# Patient Record
Sex: Female | Born: 1960
Health system: Southern US, Community
[De-identification: ages and names within clinical notes are randomized; demographics above are authoritative.]

## PROBLEM LIST (undated history)

## (undated) DIAGNOSIS — Z992 Dependence on renal dialysis: Secondary | ICD-10-CM

## (undated) DIAGNOSIS — I2510 Atherosclerotic heart disease of native coronary artery without angina pectoris: Secondary | ICD-10-CM

## (undated) DIAGNOSIS — K219 Gastro-esophageal reflux disease without esophagitis: Secondary | ICD-10-CM

## (undated) DIAGNOSIS — K8020 Calculus of gallbladder without cholecystitis without obstruction: Secondary | ICD-10-CM

## (undated) DIAGNOSIS — I517 Cardiomegaly: Secondary | ICD-10-CM

## (undated) DIAGNOSIS — M5135 Other intervertebral disc degeneration, thoracolumbar region: Secondary | ICD-10-CM

## (undated) DIAGNOSIS — I272 Pulmonary hypertension, unspecified: Secondary | ICD-10-CM

## (undated) DIAGNOSIS — I509 Heart failure, unspecified: Secondary | ICD-10-CM

## (undated) DIAGNOSIS — N186 End stage renal disease: Secondary | ICD-10-CM

## (undated) DIAGNOSIS — I6789 Other cerebrovascular disease: Secondary | ICD-10-CM

## (undated) DIAGNOSIS — E669 Obesity, unspecified: Secondary | ICD-10-CM

## (undated) DIAGNOSIS — B019 Varicella without complication: Secondary | ICD-10-CM

## (undated) DIAGNOSIS — K759 Inflammatory liver disease, unspecified: Secondary | ICD-10-CM

## (undated) DIAGNOSIS — I255 Ischemic cardiomyopathy: Secondary | ICD-10-CM

## (undated) DIAGNOSIS — M109 Gout, unspecified: Secondary | ICD-10-CM

## (undated) DIAGNOSIS — F1911 Other psychoactive substance abuse, in remission: Secondary | ICD-10-CM

## (undated) DIAGNOSIS — I5020 Unspecified systolic (congestive) heart failure: Secondary | ICD-10-CM

## (undated) DIAGNOSIS — E785 Hyperlipidemia, unspecified: Secondary | ICD-10-CM

## (undated) DIAGNOSIS — I4891 Unspecified atrial fibrillation: Secondary | ICD-10-CM

## (undated) DIAGNOSIS — I429 Cardiomyopathy, unspecified: Secondary | ICD-10-CM

## (undated) DIAGNOSIS — M1990 Unspecified osteoarthritis, unspecified site: Secondary | ICD-10-CM

## (undated) DIAGNOSIS — D631 Anemia in chronic kidney disease: Secondary | ICD-10-CM

## (undated) DIAGNOSIS — I4510 Unspecified right bundle-branch block: Secondary | ICD-10-CM

## (undated) DIAGNOSIS — M5030 Other cervical disc degeneration, unspecified cervical region: Secondary | ICD-10-CM

## (undated) DIAGNOSIS — I700 Atherosclerosis of aorta: Secondary | ICD-10-CM

## (undated) DIAGNOSIS — D649 Anemia, unspecified: Secondary | ICD-10-CM

## (undated) DIAGNOSIS — K5790 Diverticulosis of intestine, part unspecified, without perforation or abscess without bleeding: Secondary | ICD-10-CM

## (undated) DIAGNOSIS — R0600 Dyspnea, unspecified: Secondary | ICD-10-CM

## (undated) DIAGNOSIS — B1920 Unspecified viral hepatitis C without hepatic coma: Secondary | ICD-10-CM

## (undated) DIAGNOSIS — J189 Pneumonia, unspecified organism: Secondary | ICD-10-CM

## (undated) DIAGNOSIS — J449 Chronic obstructive pulmonary disease, unspecified: Secondary | ICD-10-CM

## (undated) DIAGNOSIS — Z7901 Long term (current) use of anticoagulants: Secondary | ICD-10-CM

## (undated) DIAGNOSIS — I6502 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: Secondary | ICD-10-CM

## (undated) DIAGNOSIS — N250 Renal osteodystrophy: Secondary | ICD-10-CM

## (undated) DIAGNOSIS — R519 Headache, unspecified: Secondary | ICD-10-CM

## (undated) DIAGNOSIS — I739 Peripheral vascular disease, unspecified: Secondary | ICD-10-CM

## (undated) DIAGNOSIS — F69 Unspecified disorder of adult personality and behavior: Secondary | ICD-10-CM

## (undated) DIAGNOSIS — E039 Hypothyroidism, unspecified: Secondary | ICD-10-CM

## (undated) DIAGNOSIS — N289 Disorder of kidney and ureter, unspecified: Secondary | ICD-10-CM

## (undated) DIAGNOSIS — I219 Acute myocardial infarction, unspecified: Secondary | ICD-10-CM

## (undated) DIAGNOSIS — F319 Bipolar disorder, unspecified: Secondary | ICD-10-CM

## (undated) DIAGNOSIS — I10 Essential (primary) hypertension: Secondary | ICD-10-CM

## (undated) DIAGNOSIS — I639 Cerebral infarction, unspecified: Secondary | ICD-10-CM

## (undated) DIAGNOSIS — N19 Unspecified kidney failure: Secondary | ICD-10-CM

## (undated) DIAGNOSIS — I480 Paroxysmal atrial fibrillation: Secondary | ICD-10-CM

## (undated) DIAGNOSIS — Z8659 Personal history of other mental and behavioral disorders: Secondary | ICD-10-CM

## (undated) HISTORY — DX: Essential (primary) hypertension: I10

## (undated) HISTORY — DX: Hyperlipidemia, unspecified: E78.5

## (undated) HISTORY — PX: DIALYSIS FISTULA CREATION: SHX611

## (undated) HISTORY — DX: Heart failure, unspecified: I50.9

## (undated) HISTORY — DX: Unspecified kidney failure: N19

## (undated) HISTORY — PX: OTHER SURGICAL HISTORY: SHX169

## (undated) HISTORY — PX: COLONOSCOPY: SHX174

## (undated) HISTORY — DX: Cerebral infarction, unspecified: I63.9

## (undated) HISTORY — DX: Acute myocardial infarction, unspecified: I21.9

## (undated) HISTORY — PX: CORONARY ARTERY BYPASS GRAFT: SHX141

## (undated) HISTORY — PX: ESOPHAGOGASTRODUODENOSCOPY: SHX1529

---

## 2002-01-23 DIAGNOSIS — I214 Non-ST elevation (NSTEMI) myocardial infarction: Secondary | ICD-10-CM

## 2002-01-23 HISTORY — DX: Non-ST elevation (NSTEMI) myocardial infarction: I21.4

## 2002-01-24 HISTORY — PX: LEFT HEART CATH AND CORONARY ANGIOGRAPHY: CATH118249

## 2002-01-25 HISTORY — PX: CORONARY ANGIOPLASTY WITH STENT PLACEMENT: SHX49

## 2004-06-21 ENCOUNTER — Emergency Department: Payer: Self-pay | Admitting: Emergency Medicine

## 2006-09-07 ENCOUNTER — Emergency Department: Payer: Self-pay | Admitting: Emergency Medicine

## 2006-09-07 ENCOUNTER — Other Ambulatory Visit: Payer: Self-pay

## 2008-08-21 IMAGING — CR DG CHEST 2V
1 series · 2 of 2 positions shown · non-contrast
Comparison: none

REASON FOR EXAM: chest pain - er [HOSPITAL]
COMMENTS:

PROCEDURE:     DXR - DXR CHEST PA (OR AP) AND LATERAL  - September 07, 2006  [DATE]
RESULT:     The lung fields are clear.  The heart, mediastinal and osseous
structures show no significant abnormalities.

[Series 1: view not recorded · 0.17mm/px · 2 of 2 slices shown]
[im 1/2]
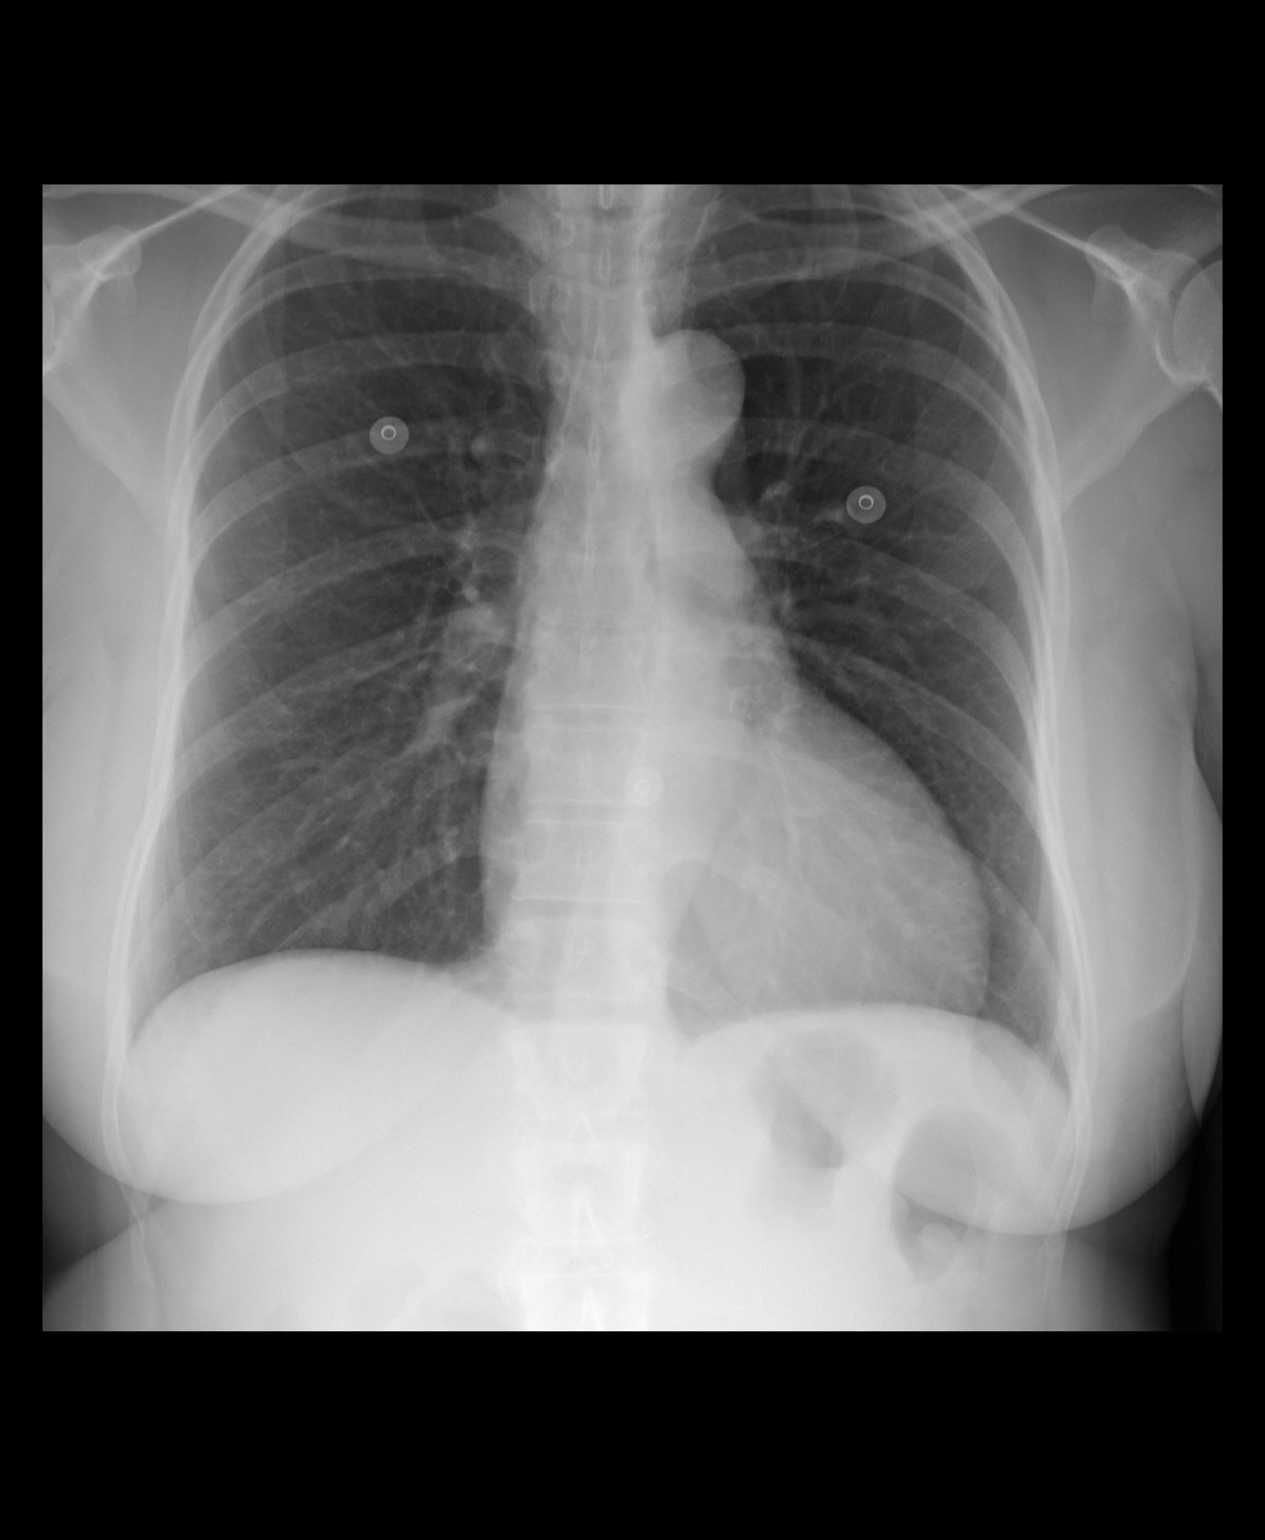
[im 2/2]
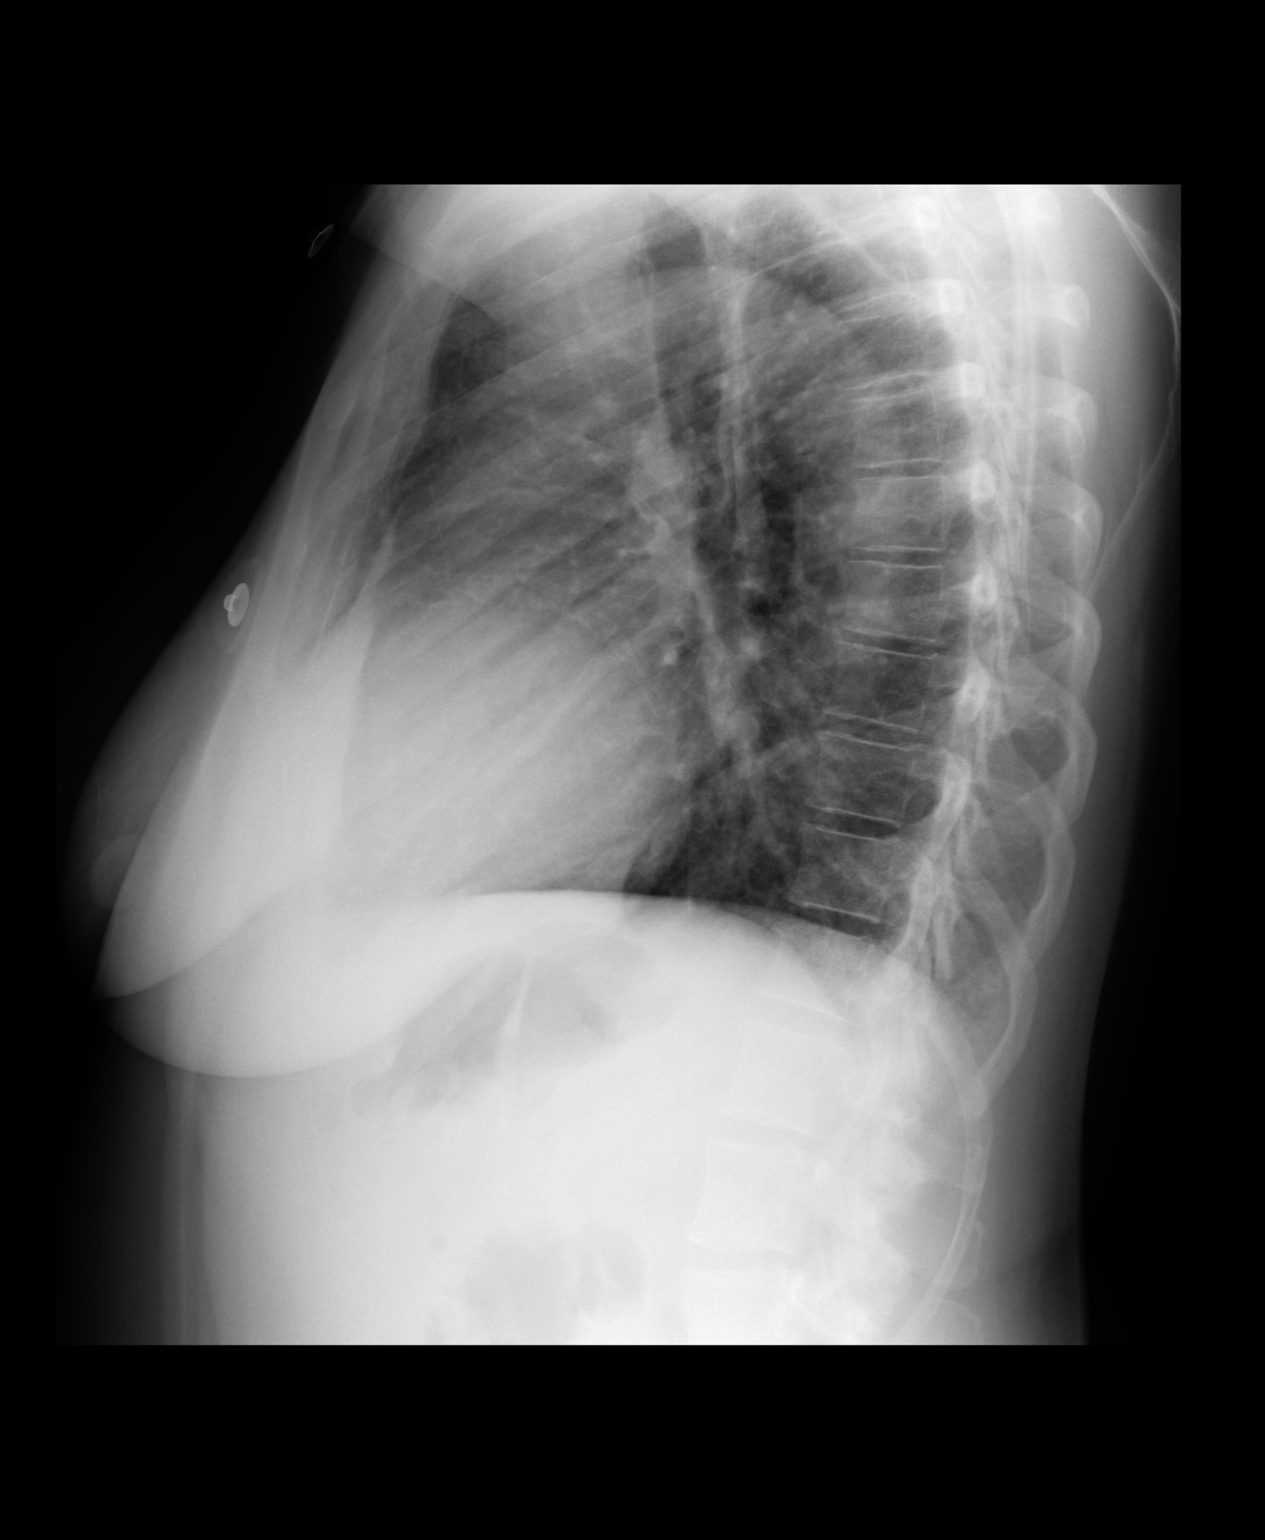

[2 of 2 positions shown; findings below may reference images not displayed]

IMPRESSION: No acute changes are identified.

## 2008-12-18 ENCOUNTER — Inpatient Hospital Stay: Payer: Self-pay | Admitting: Internal Medicine

## 2009-06-15 DIAGNOSIS — I639 Cerebral infarction, unspecified: Secondary | ICD-10-CM

## 2009-06-15 HISTORY — DX: Cerebral infarction, unspecified: I63.9

## 2010-05-21 ENCOUNTER — Inpatient Hospital Stay: Payer: Self-pay | Admitting: Internal Medicine

## 2010-06-04 ENCOUNTER — Encounter: Payer: Self-pay | Admitting: Internal Medicine

## 2010-06-15 ENCOUNTER — Encounter: Payer: Self-pay | Admitting: Internal Medicine

## 2010-06-20 ENCOUNTER — Ambulatory Visit: Payer: Self-pay | Admitting: Vascular Surgery

## 2010-06-25 ENCOUNTER — Ambulatory Visit: Payer: Self-pay | Admitting: Vascular Surgery

## 2010-08-08 ENCOUNTER — Ambulatory Visit: Payer: Self-pay | Admitting: Vascular Surgery

## 2010-08-13 ENCOUNTER — Inpatient Hospital Stay: Payer: Self-pay | Admitting: Internal Medicine

## 2010-09-10 ENCOUNTER — Ambulatory Visit: Payer: Self-pay | Admitting: Vascular Surgery

## 2010-10-08 ENCOUNTER — Ambulatory Visit: Payer: Self-pay | Admitting: Vascular Surgery

## 2010-10-14 ENCOUNTER — Inpatient Hospital Stay: Payer: Self-pay | Admitting: Internal Medicine

## 2010-11-17 ENCOUNTER — Ambulatory Visit: Payer: Self-pay | Admitting: Vascular Surgery

## 2010-12-02 IMAGING — CR DG CHEST 1V PORT
1 series · 1 of 1 positions shown · non-contrast
Comparison: none

REASON FOR EXAM: shortness of breath
COMMENTS:

PROCEDURE:     DXR - DXR PORTABLE CHEST SINGLE VIEW  - December 18, 2008  [DATE]
RESULT:     Cardiomegaly is present. There is mild interstitial prominence.
A mild component of congestive heart failure may be present. Chest is
otherwise unremarkable.

[view not recorded]
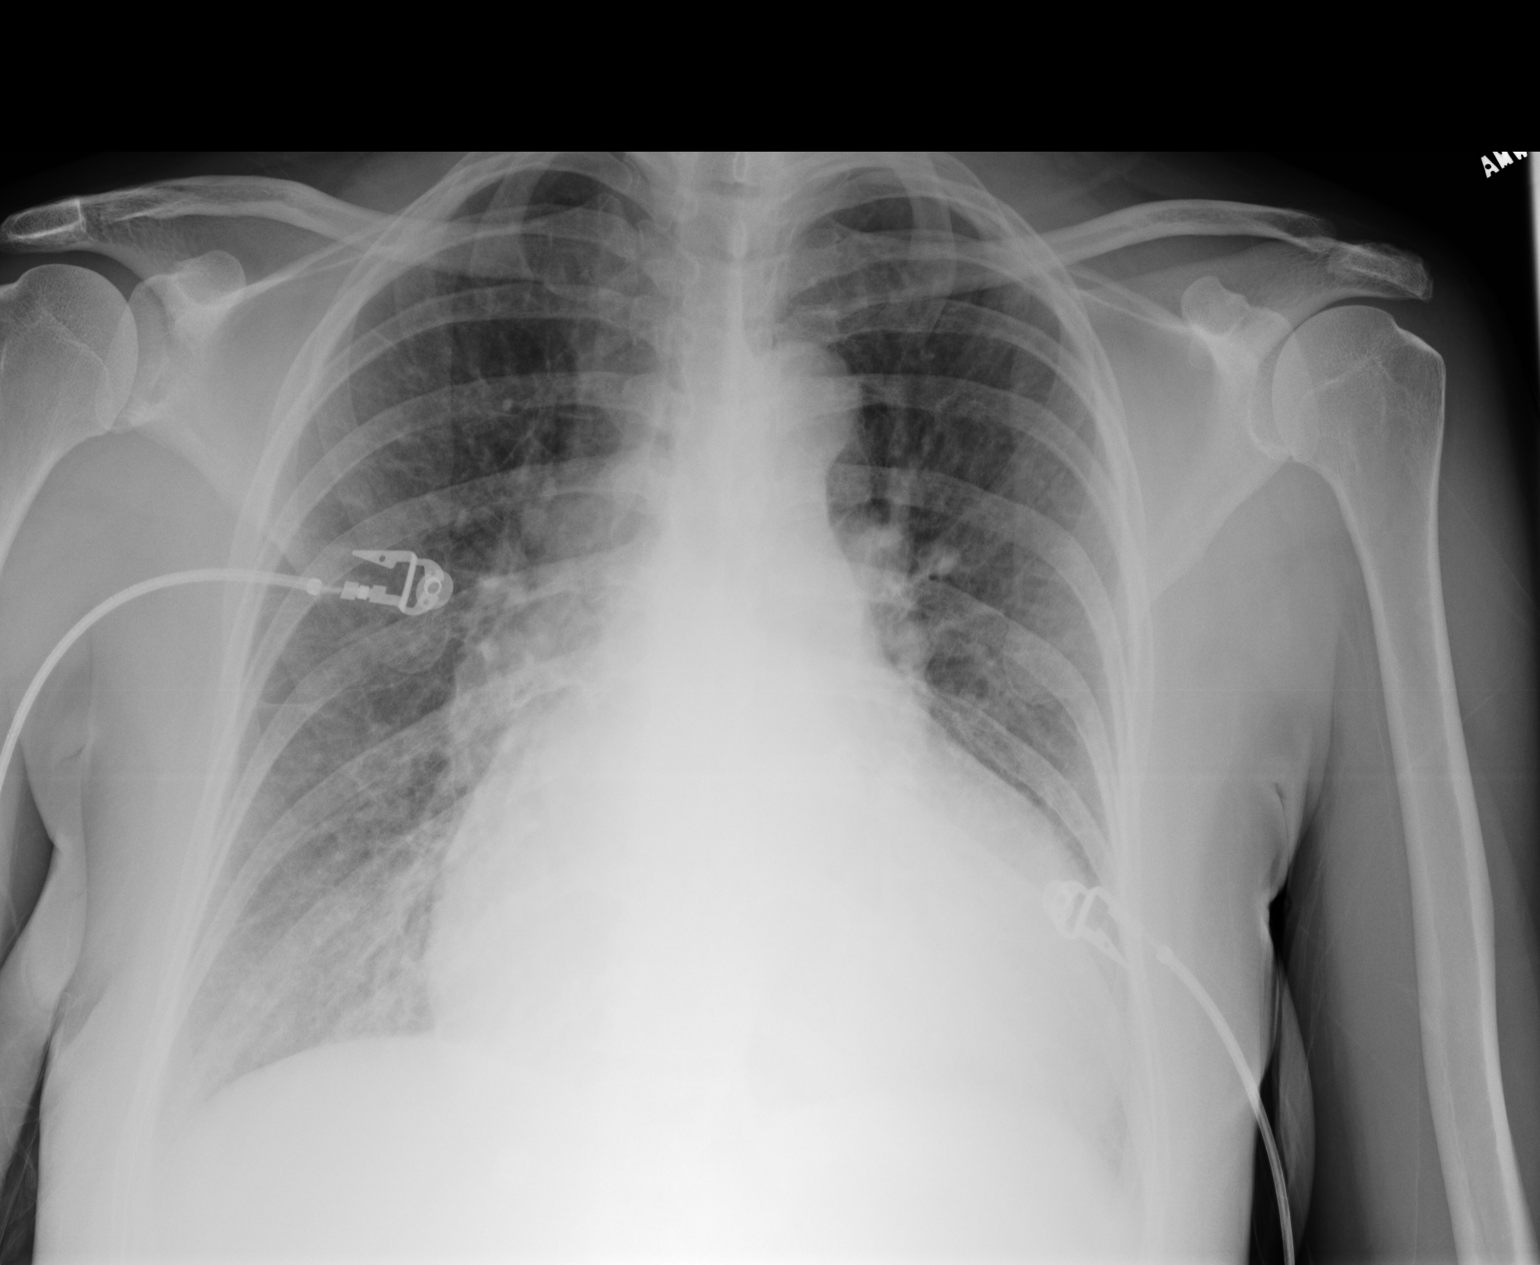

[1 of 1 positions shown; findings below may reference images not displayed]

IMPRESSION: 1. Findings suggesting congestive heart failure with pulmonary interstitial
pneumonia.

## 2010-12-02 IMAGING — US US RENAL KIDNEY
1 series · 17 of 25 positions shown · non-contrast
Comparison: none

REASON FOR EXAM: hypertive crisis /renal failure
COMMENTS:

PROCEDURE:     US  - US KIDNEY  - December 18, 2008  [DATE]
RESULT:     Bilateral echogenic kidneys are noted consistent with chronic
renal disease. Right kidney measures 11.2 cm in maximum length the left 11
cm. There is no hydronephrosis. Bladder is normal.

[Series 1: us renal kidney · 17 of 35 slices shown]
[im 1/35]
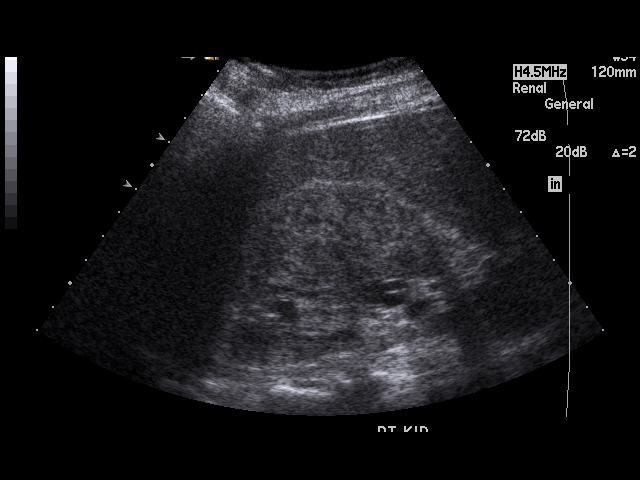
[im 3/35]
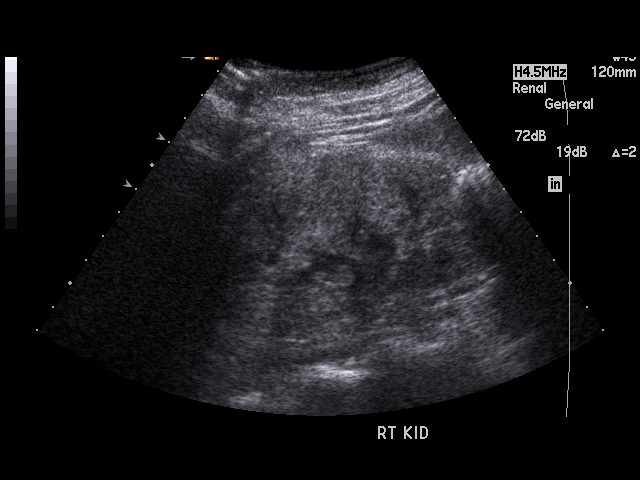
[im 5/35]
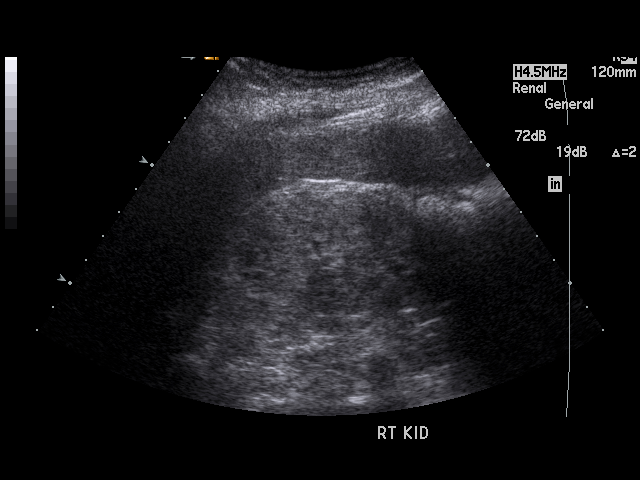
[im 8/35]
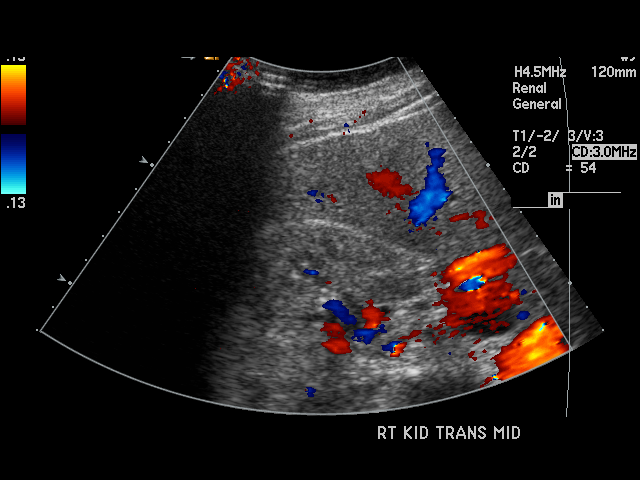
[im 9/35]
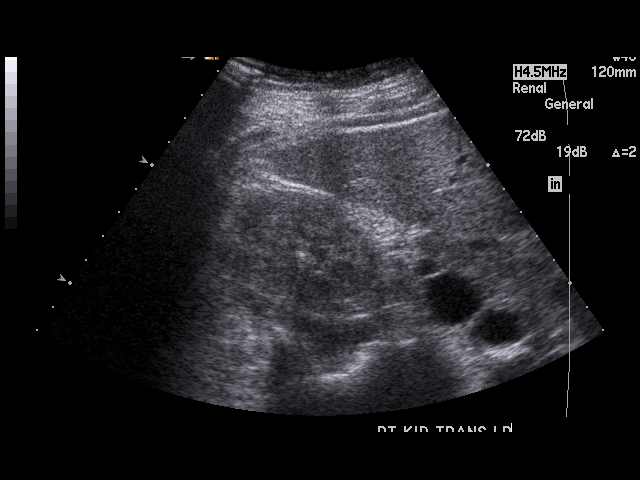
[im 12/35]
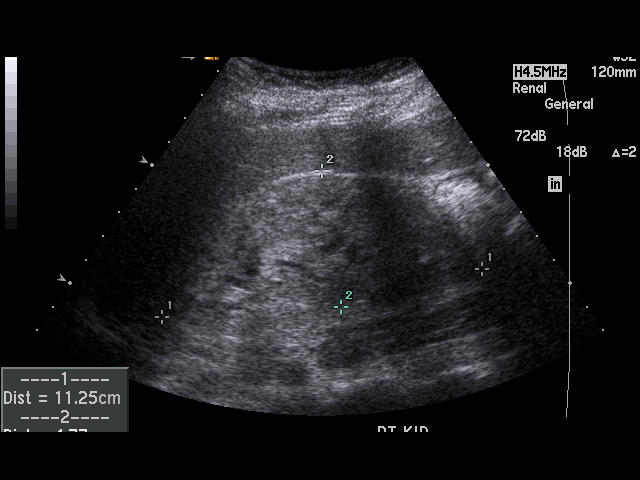
[im 13/35]
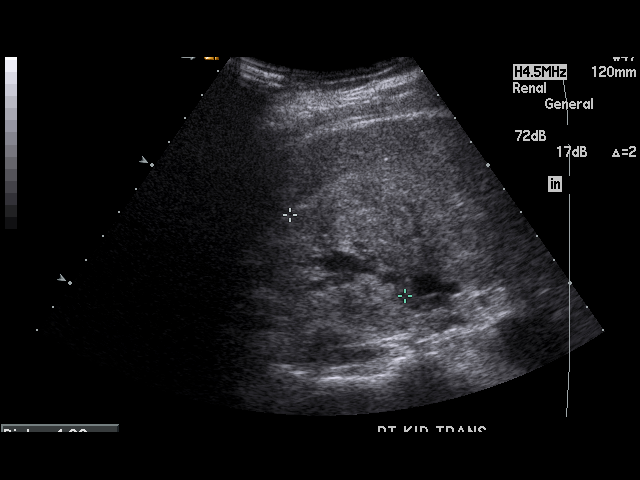
[im 16/35]
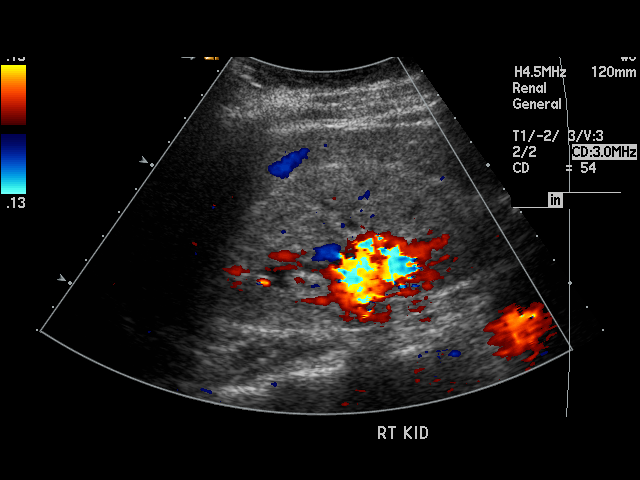
[im 18/35]
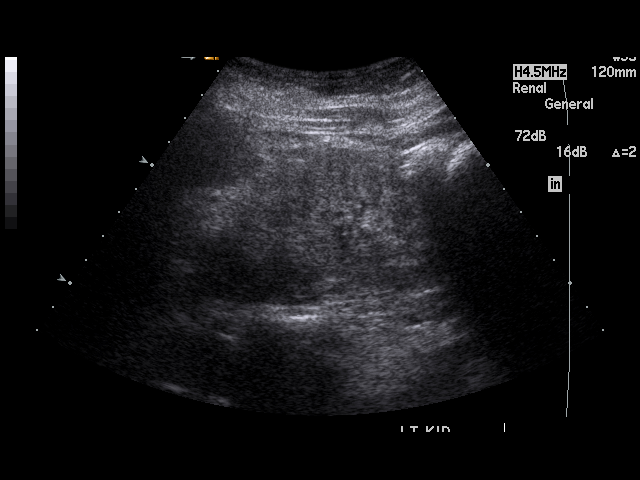
[im 19/35]
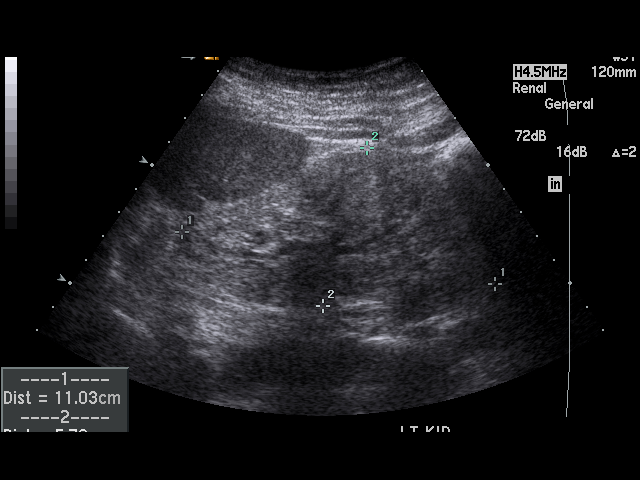
[im 22/35]
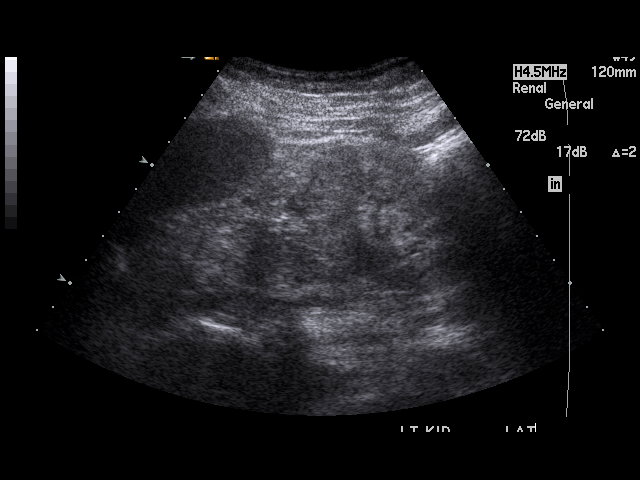
[im 23/35]
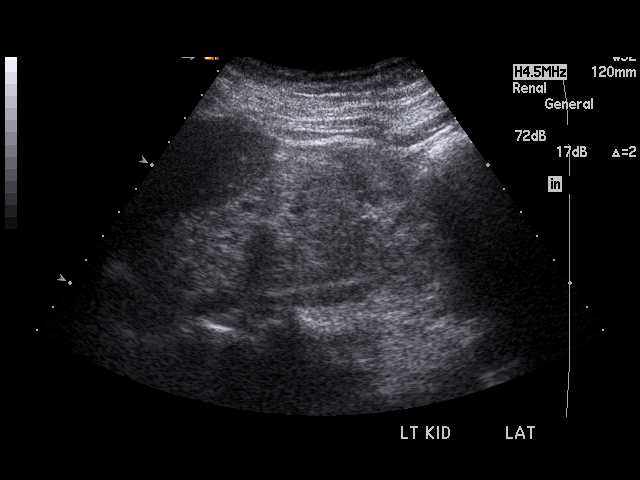
[im 26/35]
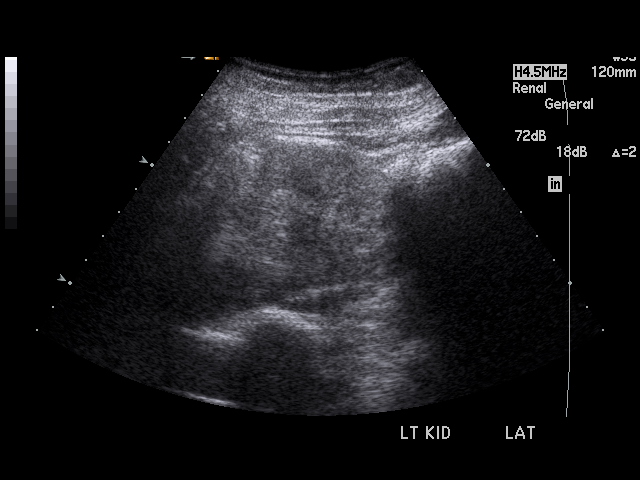
[im 27/35]
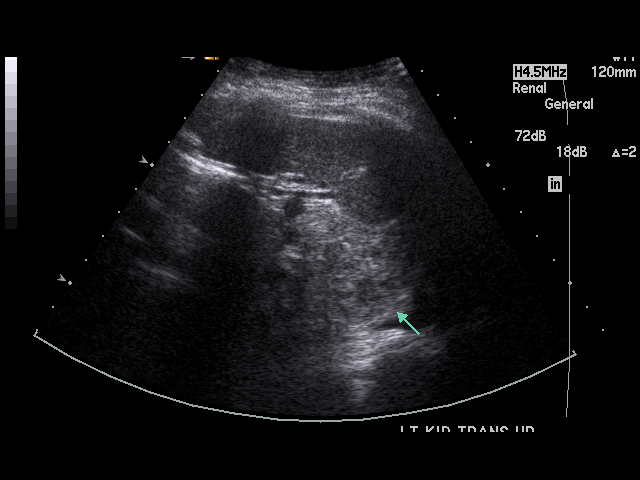
[im 30/35]
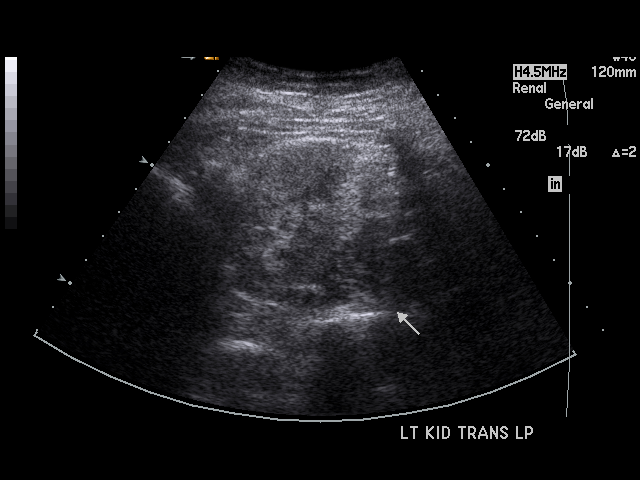
[im 32/35]
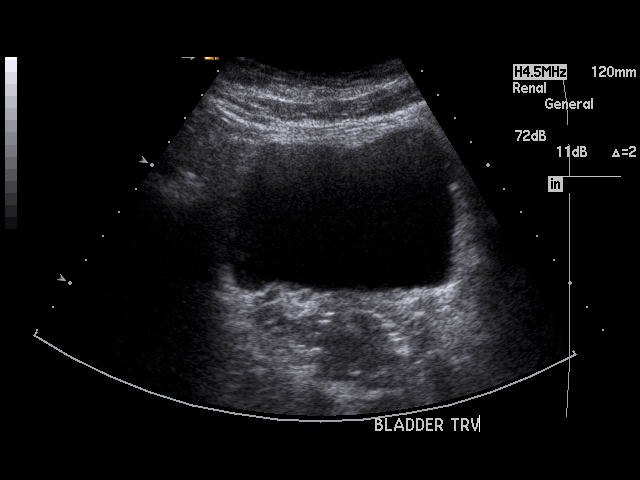
[im 35/35]
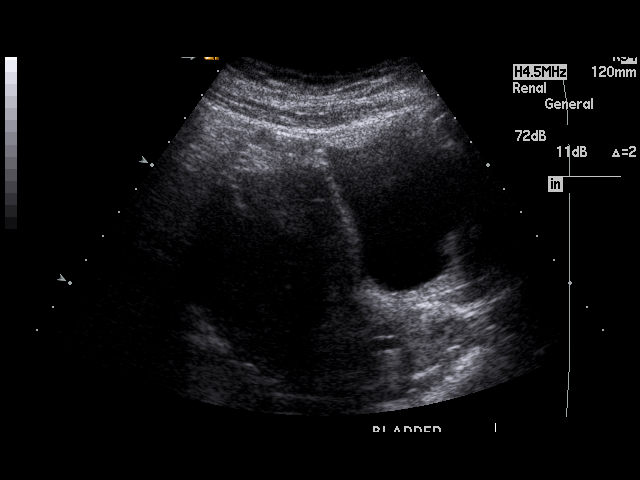

[17 of 25 positions shown; findings below may reference images not displayed]

IMPRESSION: Bilateral echogenic kidneys consistent with chronic renal
disease. No hydronephrosis or bladder distention. Urine is present in the
bladder.

## 2011-06-16 HISTORY — PX: CORONARY ANGIOPLASTY WITH STENT PLACEMENT: SHX49

## 2011-12-16 ENCOUNTER — Ambulatory Visit: Payer: Self-pay | Admitting: Vascular Surgery

## 2011-12-16 LAB — POTASSIUM: Potassium: 3.7 mmol/L (ref 3.5–5.1)

## 2012-01-20 ENCOUNTER — Ambulatory Visit: Payer: Self-pay | Admitting: Vascular Surgery

## 2012-05-04 IMAGING — CR DG CHEST 2V
1 series · 2 of 2 positions shown · non-contrast
Comparison: none

REASON FOR EXAM: confusion
COMMENTS:

[Series 1: view not recorded · 0.17mm/px · 2 of 2 slices shown]
[im 1/2]
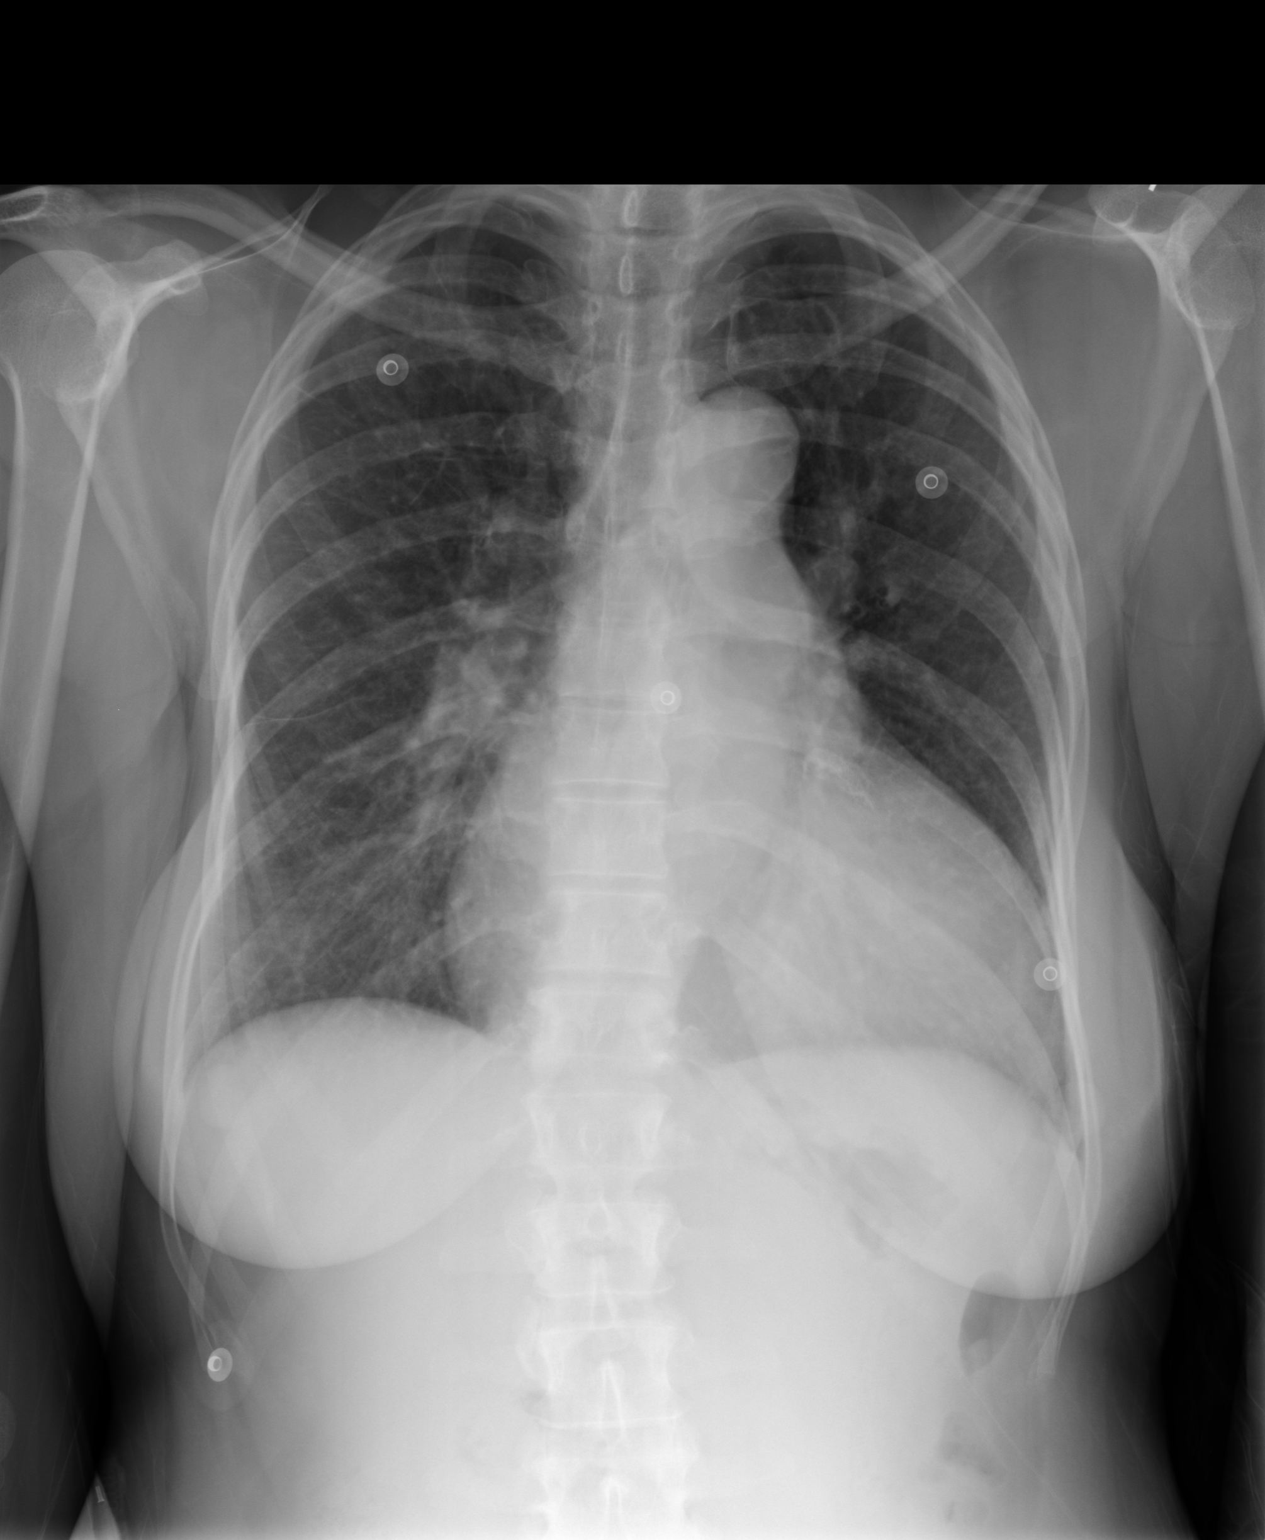
[im 2/2]
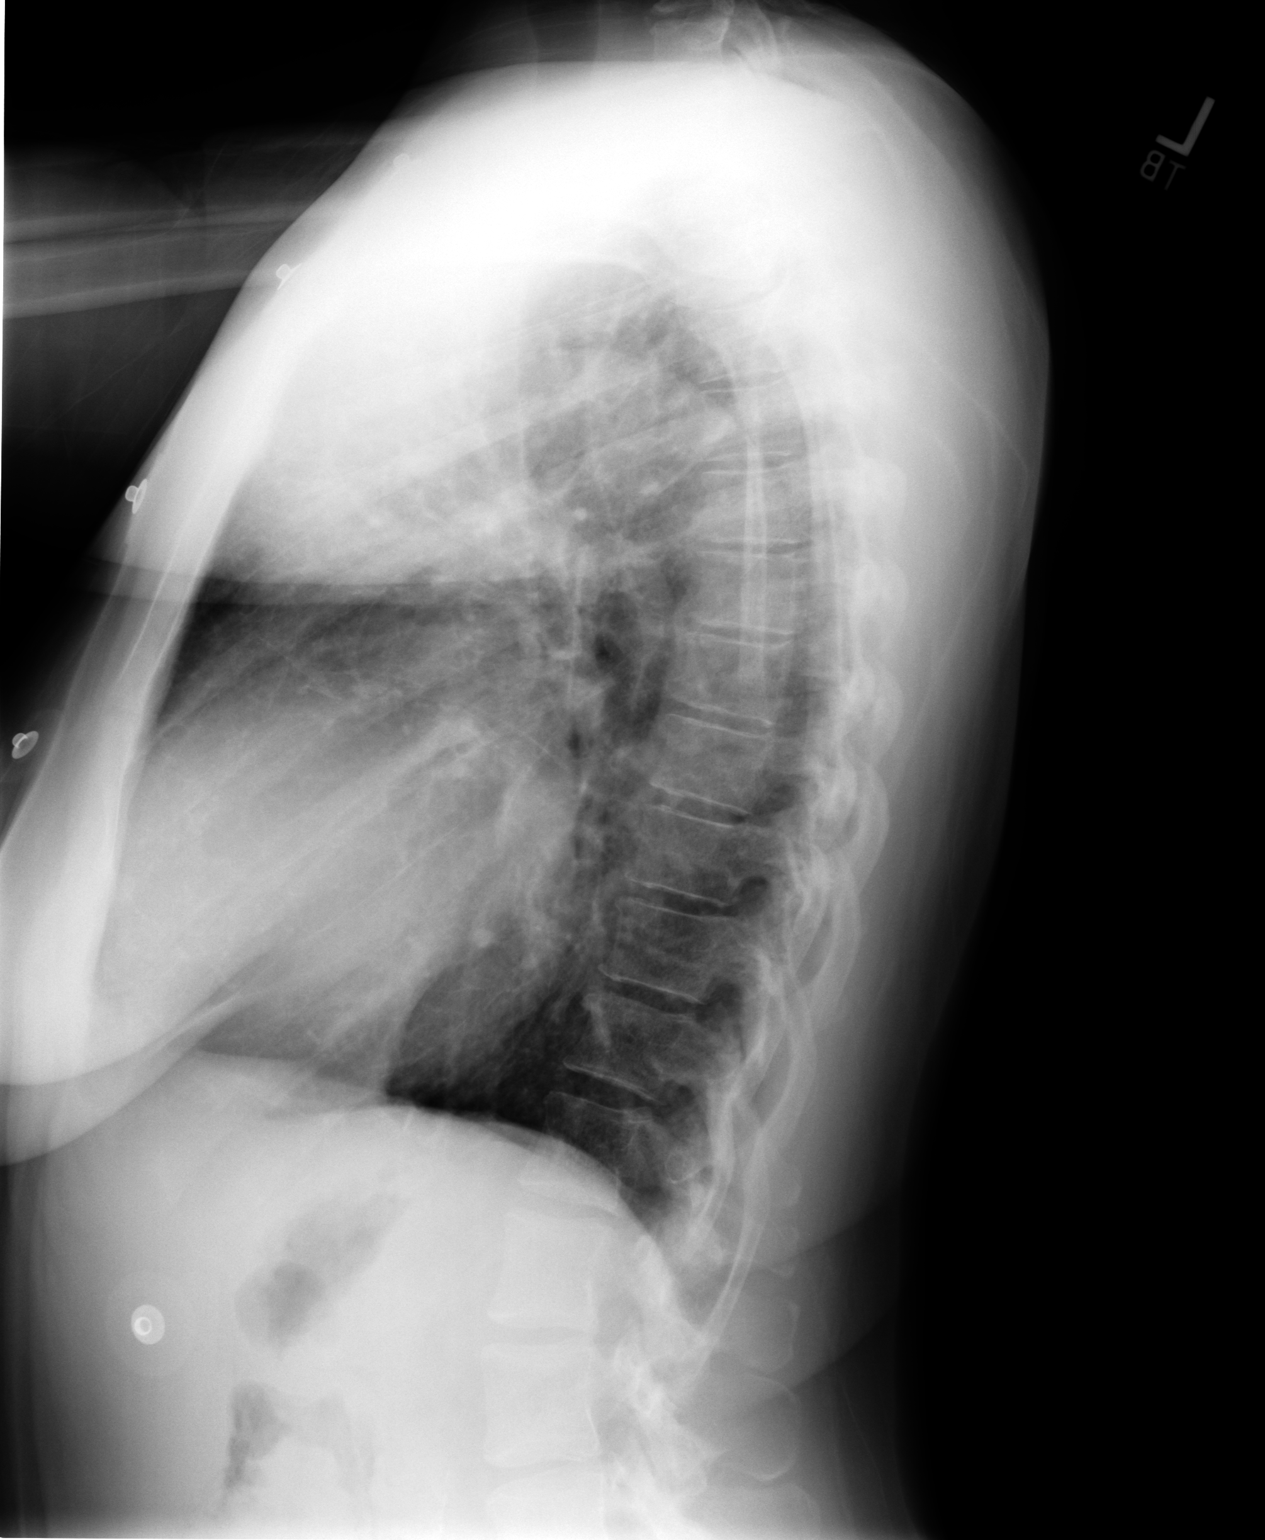

[2 of 2 positions shown; findings below may reference images not displayed]

PROCEDURE:     DXR - DXR CHEST PA (OR AP) AND LATERAL  - May 21, 2010  [DATE]

RESULT:     Comparison is made to the prior exam of 12/18/2008.

There is slight prominence of the pulmonary vascularity suspicious for
minimal pulmonary vascular congestion. The findings are less prominent than
that observed on the exam of 12/18/2008 but are more prominent than on the
baseline exam of 09/07/2006. The heart is moderately enlarged but is stable
in size as compared to the prior exam. No frank pulmonary edema or pleural
effusion is seen. The osseous structures are normal in appearance.
IMPRESSION: 1.  There are observed changes compatible with chronic or recurrent
pulmonary vascular congestion.
2.  No frank pulmonary edema is identified at this time.
3.  No pneumonia is seen.
4.  Cardiomegaly.

## 2012-05-04 IMAGING — CT CT HEAD WITHOUT CONTRAST
2 series · 15 of 30 positions shown, 19 images · non-contrast
Comparison: none

REASON FOR EXAM: confusion, exclude bleed
COMMENTS:

PROCEDURE:     CT  - CT HEAD WITHOUT CONTRAST  - May 21, 2010  [DATE]
RESULT:     Head CT dated 05/21/2010
TECHNIQUE: Helical noncontrasted 5 mm sections were obtained from the skull
base to the vertex.

[Series 2: without · axial · non-contrast · 0.38mm/px · z∈[+1113,+1233]mm · 13 of 30 slices shown, 17 images]
[im 3/30  brain]
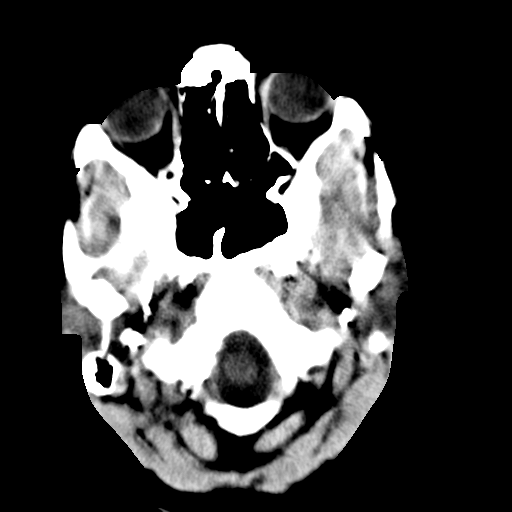
[im 3/30  bone]
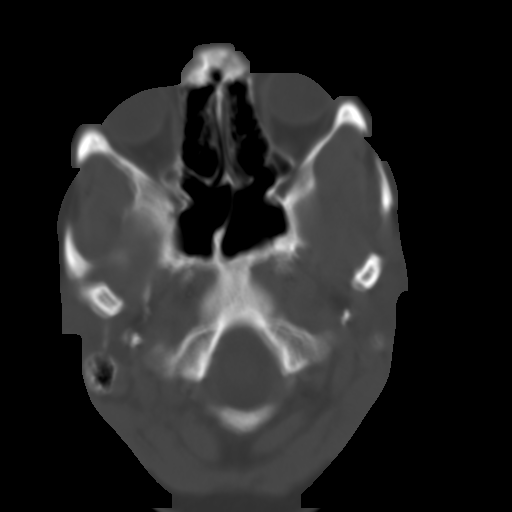
[im 5/30  brain]
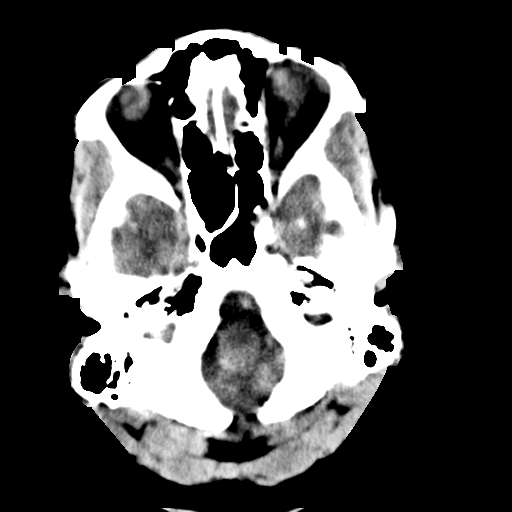
[im 7/30  brain]
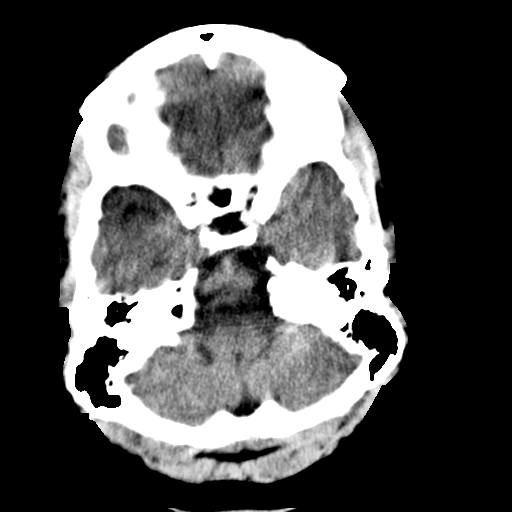
[im 9/30  brain]
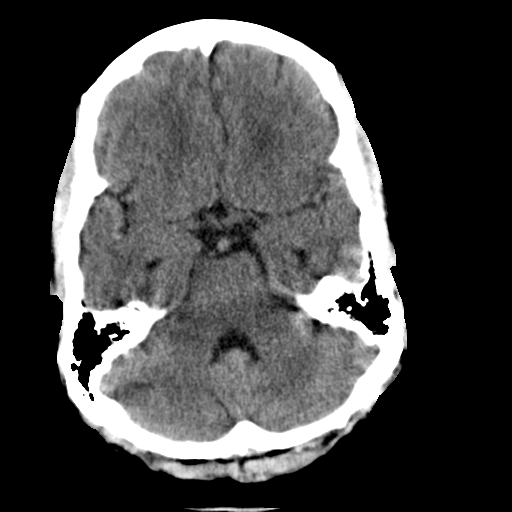
[im 11/30  brain]
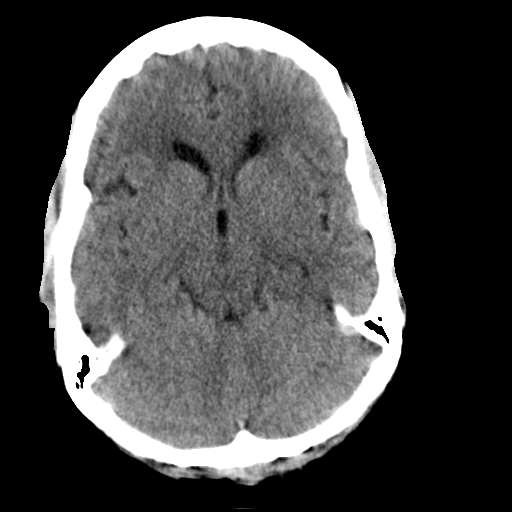
[im 11/30  bone]
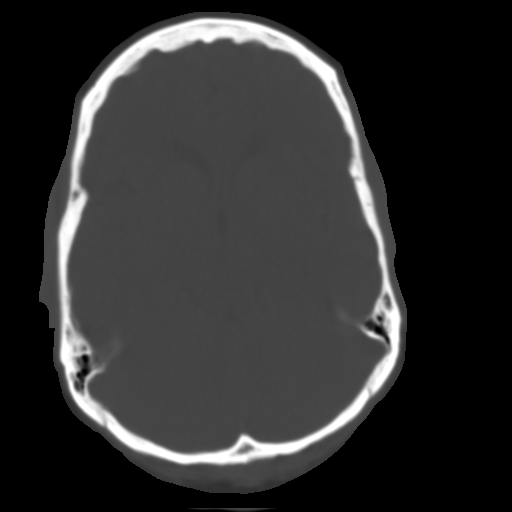
[im 13/30  brain]
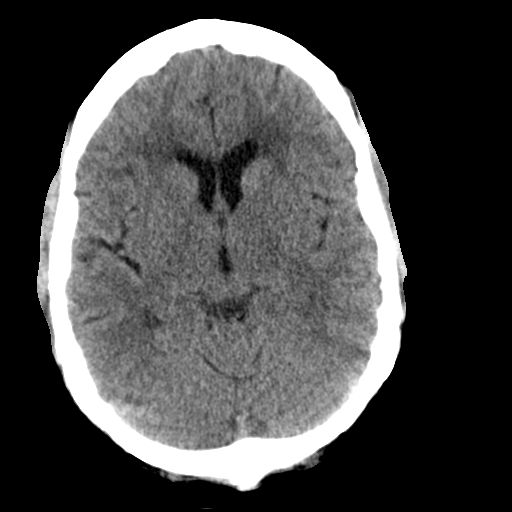
[im 15/30  brain]
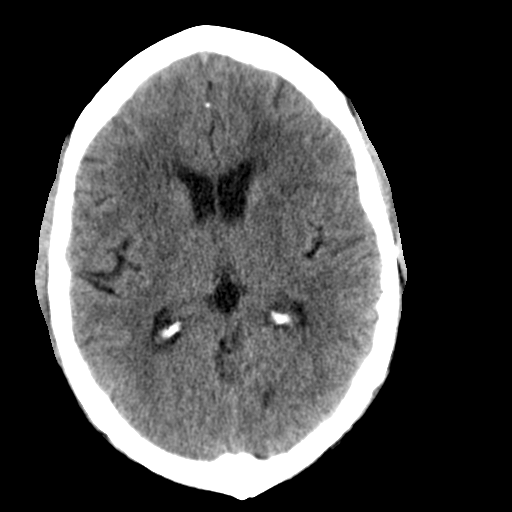
[im 17/30  brain]
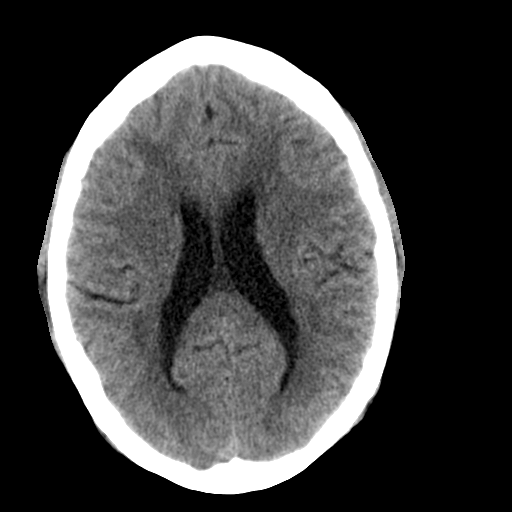
[im 19/30  brain]
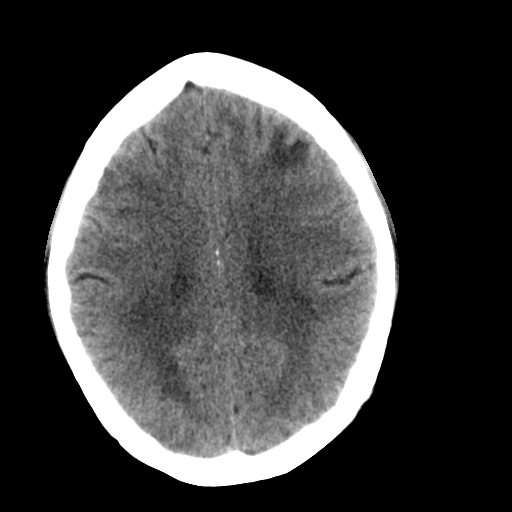
[im 19/30  bone]
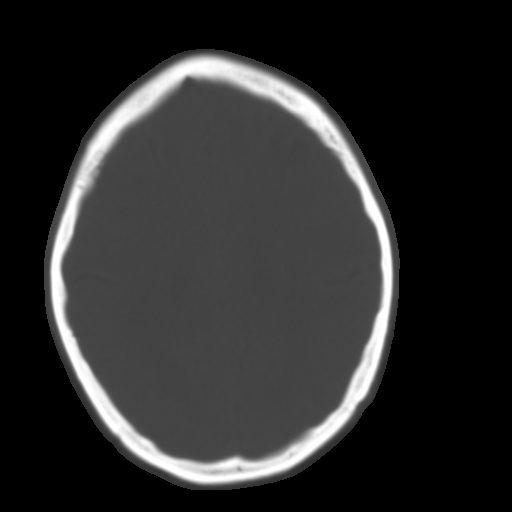
[im 21/30  brain]
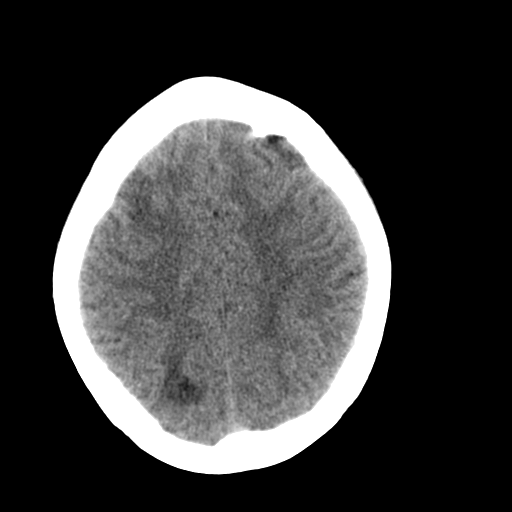
[im 23/30  brain]
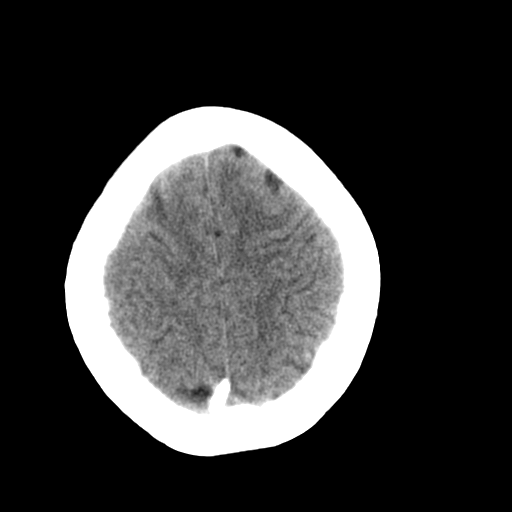
[im 25/30  brain]
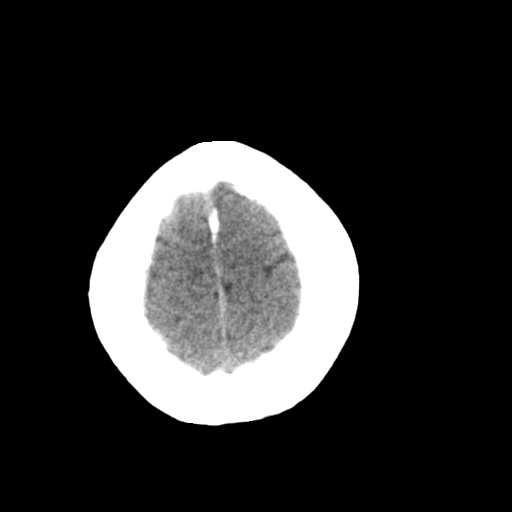
[im 27/30  brain]
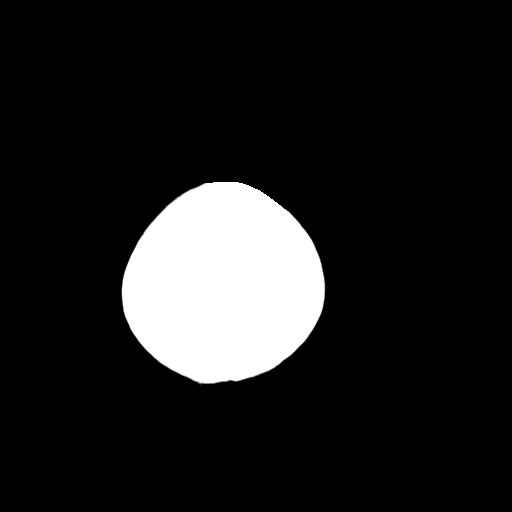
[im 27/30  bone]
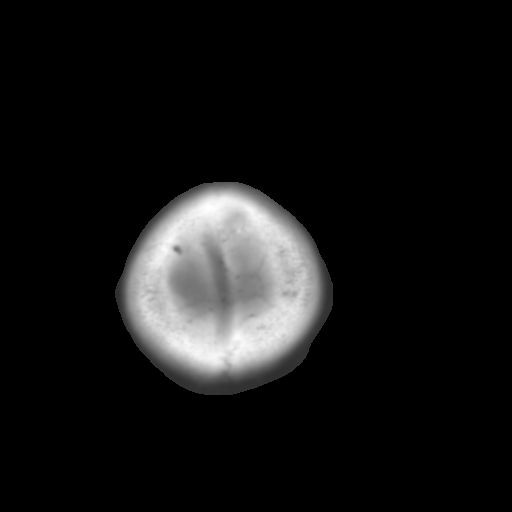

[Series 3: bone · axial · 0.38mm/px · z∈[+1113,+1133]mm · 2 of 31 slices shown]
[im 3/31  bone]
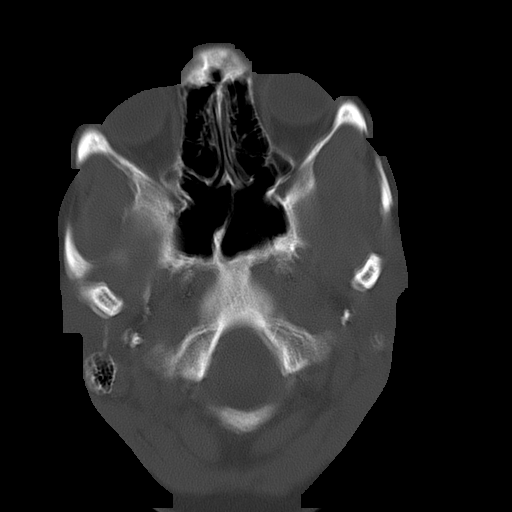
[im 7/31  bone]
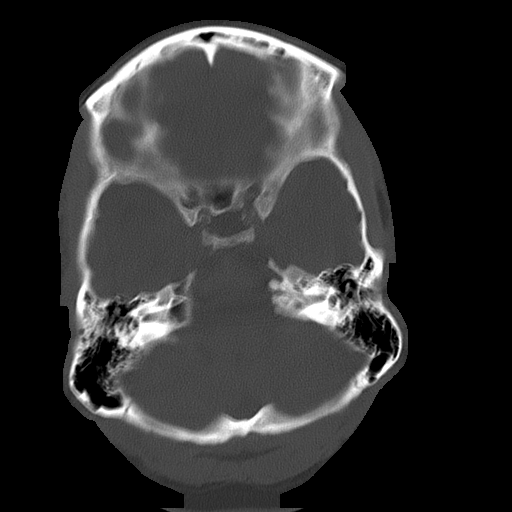

[15 of 30 positions shown; findings below may reference images not displayed]

FINDINGS: Focal punctate areas of low attenuation project within the
anterior periphery of the right left frontal lobes within the right
posterior parietal region. These areas demonstrate possible white matter
distribution pattern . Different considerations are regions of infarction,
neoplastic disease, infection or possibly a demyelinating disorder. Further
evaluation with MRI is recommended. Diffuse areas of low attenuation project
within the subcortical, deep, and periventricular white matter regions. The
ventricles and cisterns are patent. No evidence of acute hemorrhage nor
subfalcine or tonsillar herniation.  No evidence of a depressed skull
fracture.
IMPRESSION: Bilateral focal areas of low attenuation as described
above. Different considerations are regions of infarction, though etiologies
such as neoplastic disease and/or infection as well as white matter
demyelinating disorders cannot be excluded. Further evaluation with MRI is
recommended.
2. These findings were discussed with Dr. Lulusa of the emergency department
at the time of initial interpretation.
3. Small vessel white matter ischemic changes.

## 2012-05-06 IMAGING — MR MRI HEAD WITHOUT CONTRAST
5 of 6 series · 35 of 48 positions shown · non-contrast
Comparison: none

REASON FOR EXAM: dysphonia and positive CT head
COMMENTS:

PROCEDURE:     MR  - MR BRAIN WO CONTRAST  - May 23, 2010  [DATE]
RESULT:     Comparison: CT brain 05/21/2010
TECHNIQUE: Standard brain protocol, without intravenous contrast.

[Series 2: T1 · axial · 10.0mm · 0.57mm/px · z∈[+0,+120]mm · 3 of 24 slices shown]
[im 1/24]
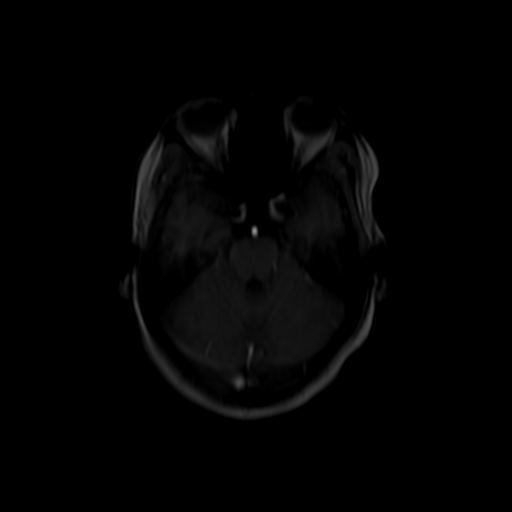
[im 4/24]
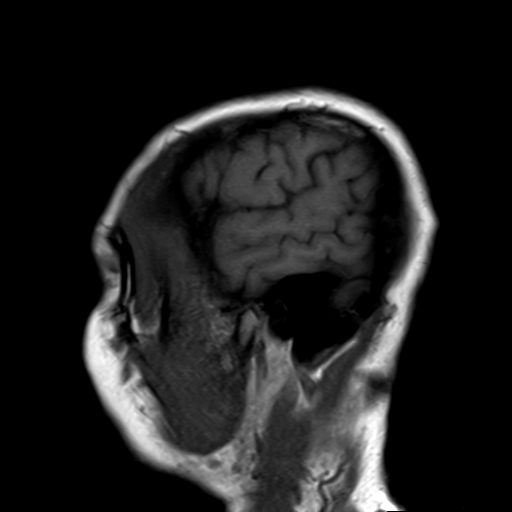
[im 7/24]
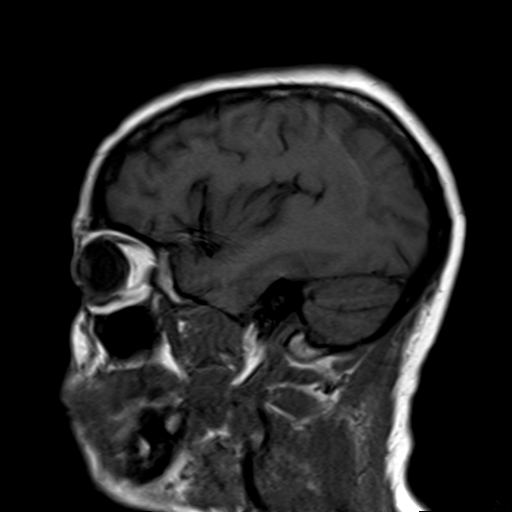

[Series 6: T2 · axial · 5.0mm · 0.60mm/px · z∈[-53,+140]mm · 8 of 24 slices shown]
[im 1/24]
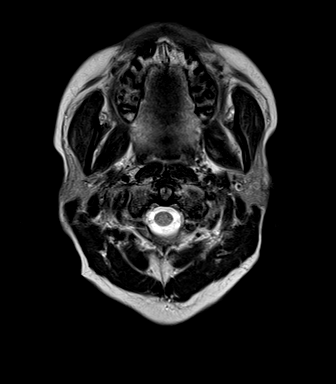
[im 4/24]
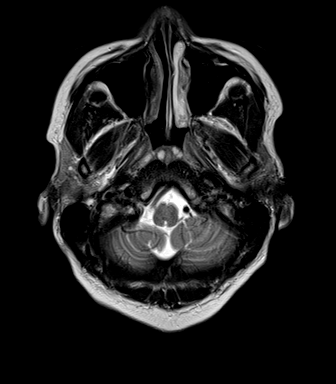
[im 7/24]
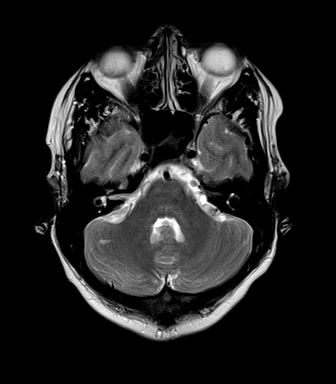
[im 10/24]
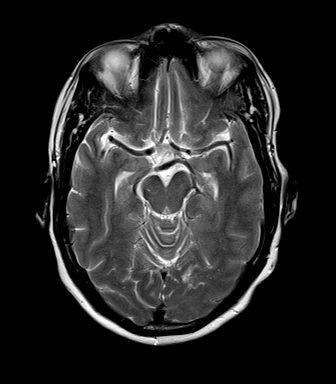
[im 14/24]
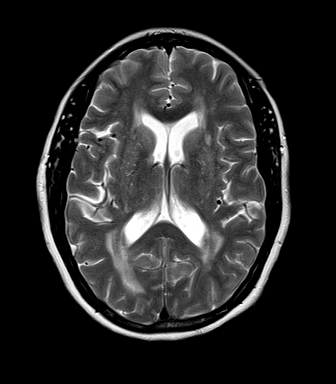
[im 17/24]
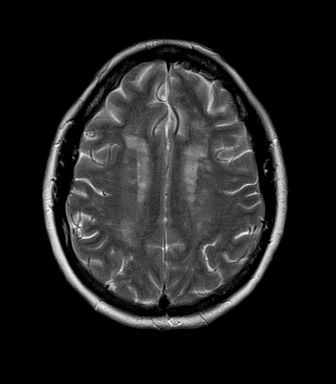
[im 20/24]
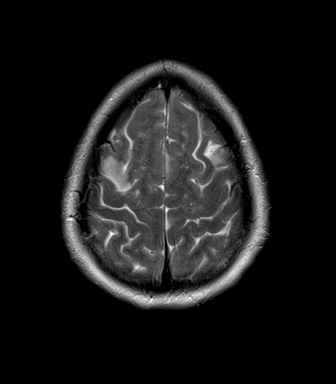
[im 24/24]
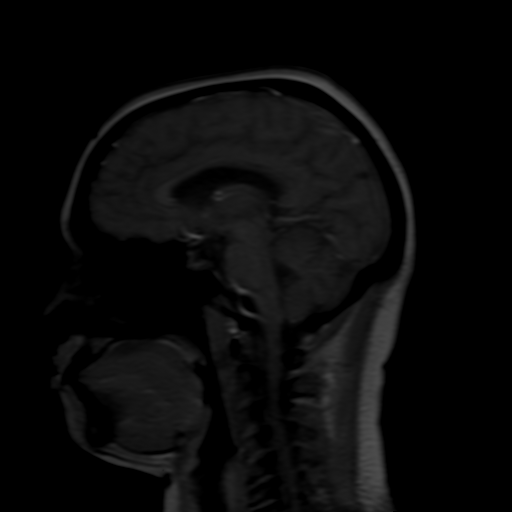

[Series 7: FLAIR · axial · 5.0mm · 0.45mm/px · z∈[-53,+140]mm · 8 of 24 slices shown]
[im 1/24]
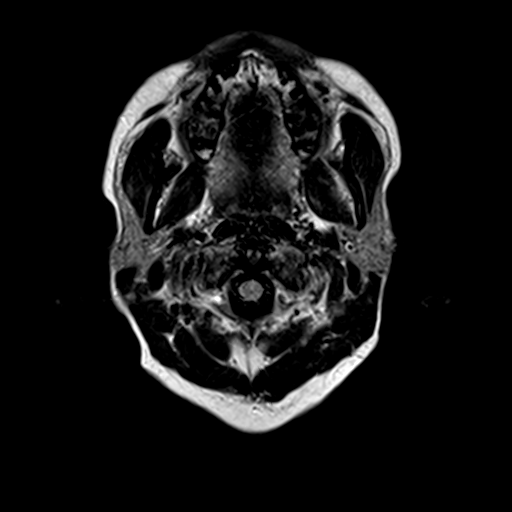
[im 4/24]
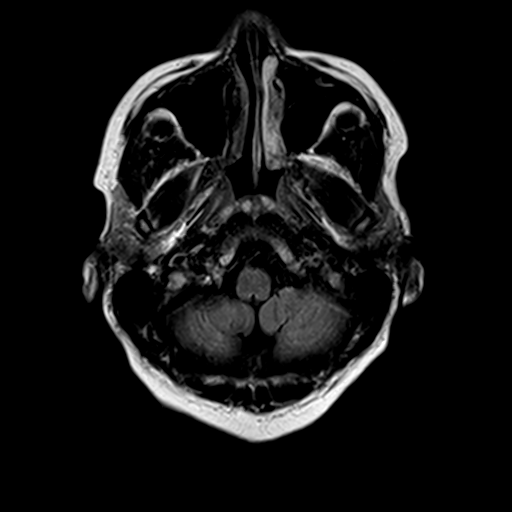
[im 7/24]
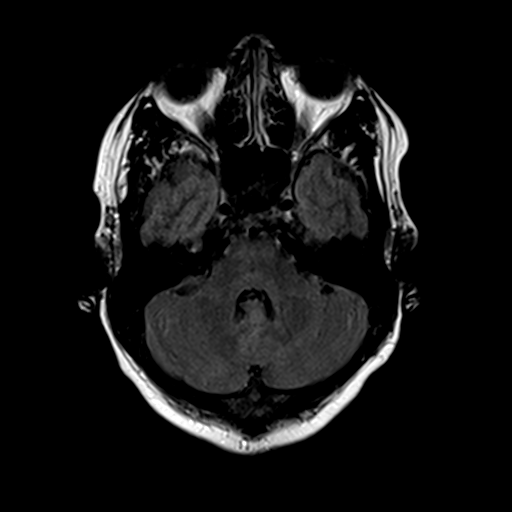
[im 10/24]
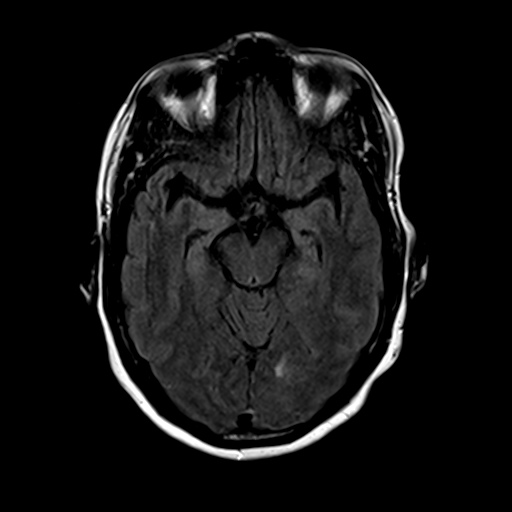
[im 14/24]
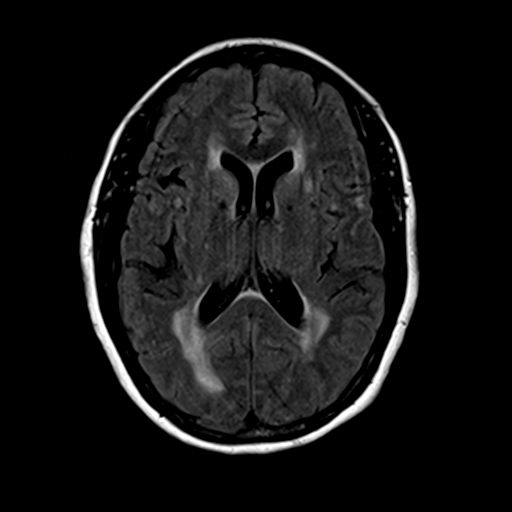
[im 17/24]
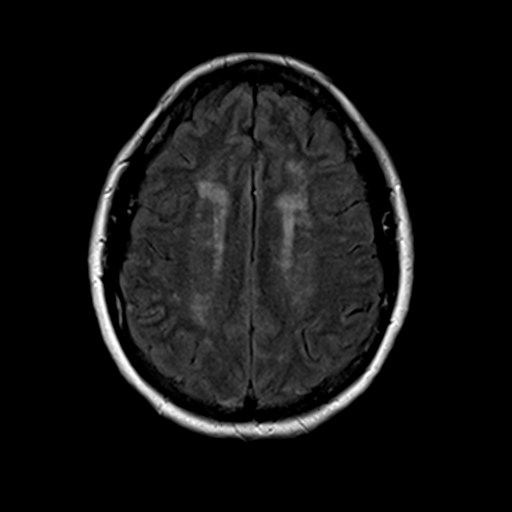
[im 20/24]
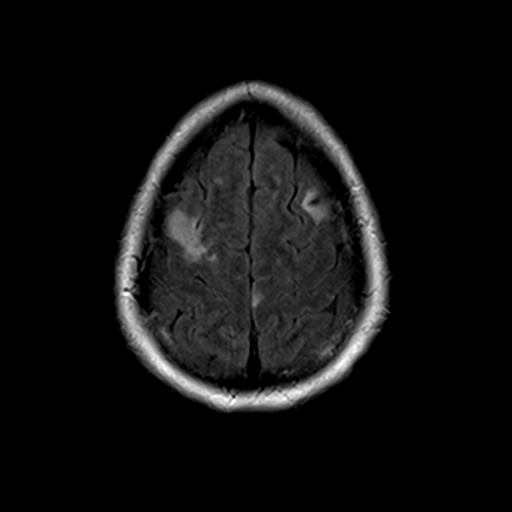
[im 24/24]
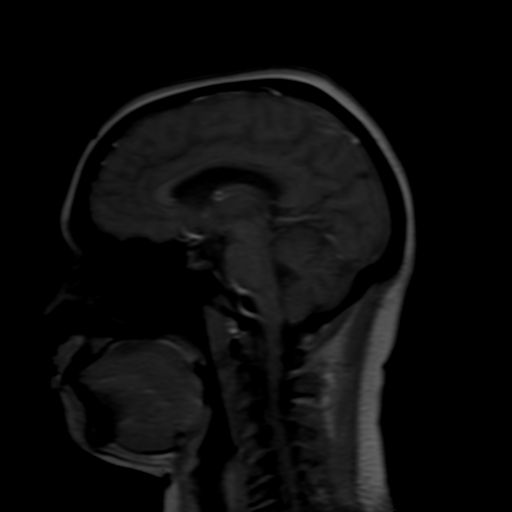

[Series 100: DWI · axial · 5.0mm · 1.80mm/px · z∈[-53,+140]mm · 8 of 23 slices shown]
[im 1/23]
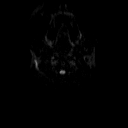
[im 4/23]
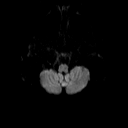
[im 7/23]
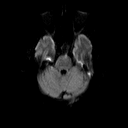
[im 10/23]
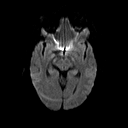
[im 13/23]
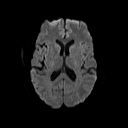
[im 16/23]
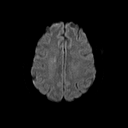
[im 19/23]
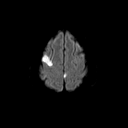
[im 23/23]
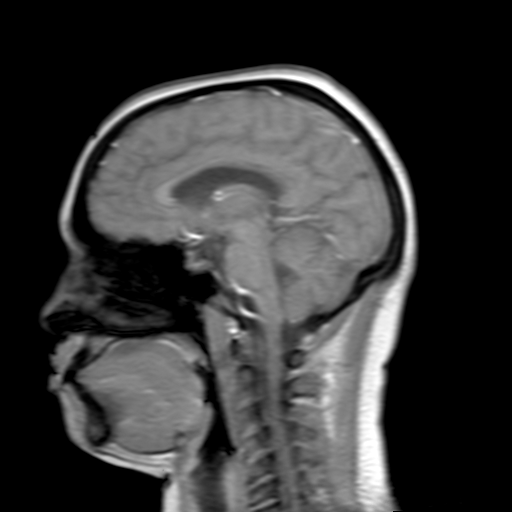

[Series 101: ADC · axial · 5.0mm · 1.80mm/px · z∈[-53,+140]mm · 8 of 24 slices shown]
[im 1/24]
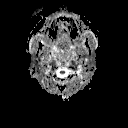
[im 4/24]
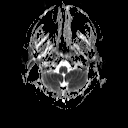
[im 7/24]
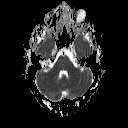
[im 10/24]
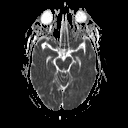
[im 14/24]
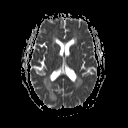
[im 17/24]
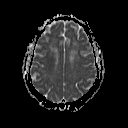
[im 20/24]
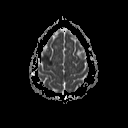
[im 24/24]
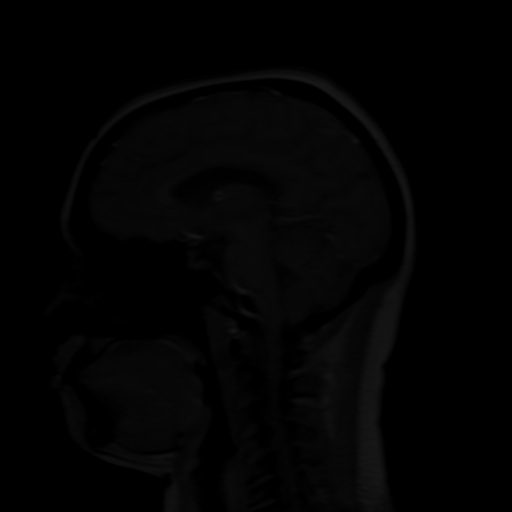

[35 of 48 positions shown; findings below may reference images not displayed]

FINDINGS: There is focal restricted diffusion associated with the cortex in the right
frontal lobe extending into the white matter. Additionally, there is a small
focus of restricted diffusion in the cortex of the medial left
frontoparietal lobe. There is periventricular T2 hyperintensity as well as
multiple foci of T2 hypertensity in the subcortical white matter. There is
an area of increased FLAIR signal in the cortex of the left frontal lobe.
These findings are nonspecific, but likely sequela of chronic small vessel
ischemic disease and old prior infarcts. No evidence of midline shift, mass
effect, or extra-axial fluid collection.

The paranasal sinuses are well aerated. The intracranial flow voids are
unremarkable.
IMPRESSION: Findings felt to represent bilateral acute infarcts on the background of
chronic small vessel ischemic disease and old prior infarcts. Metastatic
disease is felt less likely, but not excluded. If there is continued
clinical concern, further evaluation could be provided with postcontrast MRI
images.

## 2012-05-09 IMAGING — XA IR VASCULAR PROCEDURE
1 series · 1 of 1 positions shown · non-contrast
Comparison: none

[Series 1: single · 1 of 1 slices shown]
[im 1/1]
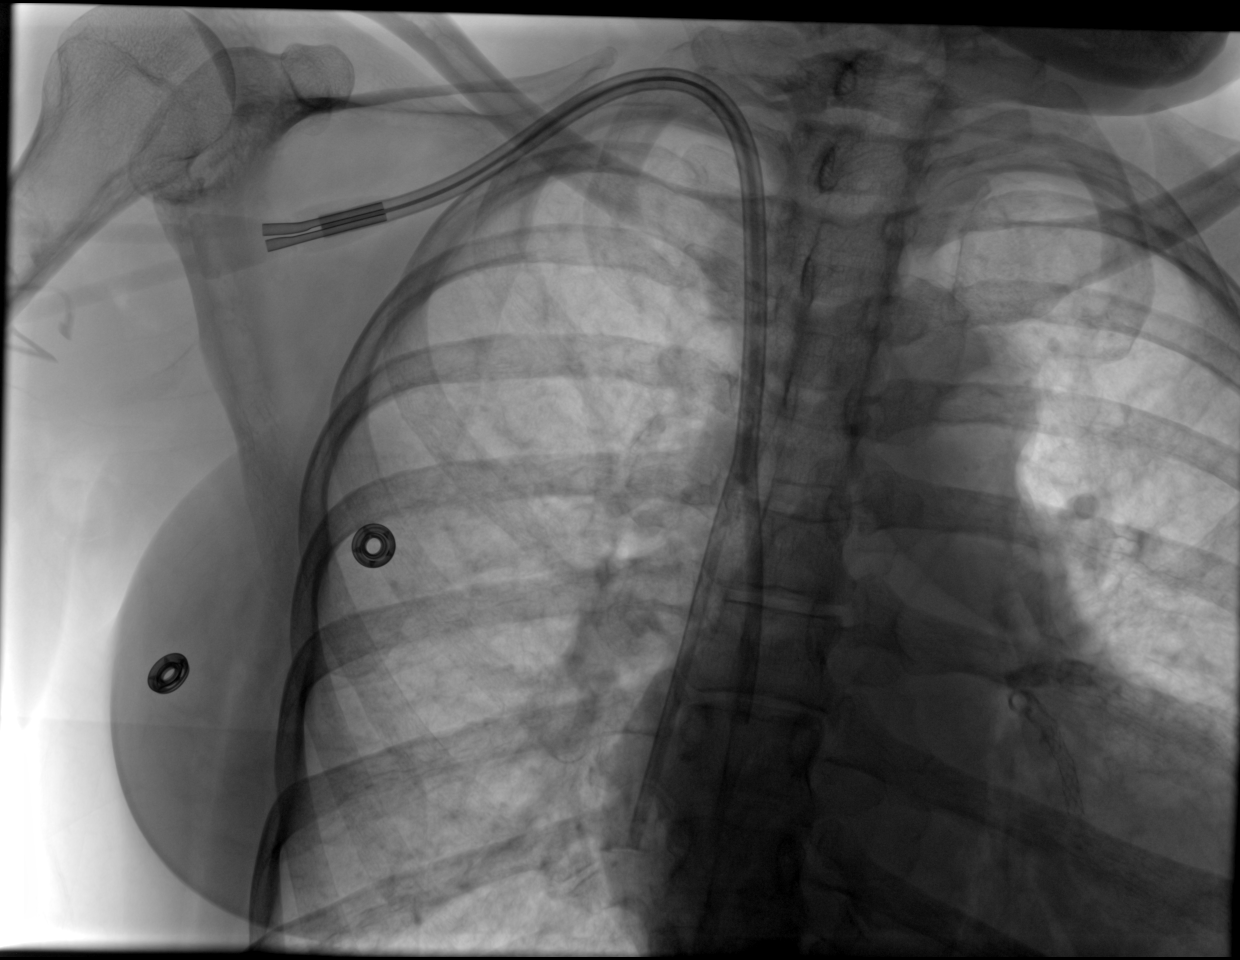

[1 of 1 positions shown; findings below may reference images not displayed]

IMAGES IMPORTED FROM THE [employer] WORKFLOW SYSTEM
NO DICTATION FOR STUDY

## 2012-05-24 ENCOUNTER — Inpatient Hospital Stay: Payer: Self-pay | Admitting: Student

## 2012-05-24 LAB — CK TOTAL AND CKMB (NOT AT ARMC)
CK-MB: 3.1 ng/mL (ref 0.5–3.6)
CK-MB: 2.4 ng/mL (ref 0.5–3.6)

## 2012-05-24 LAB — CBC
HCT: 34.3 % — ABNORMAL LOW (ref 35.0–47.0)
MCH: 33.1 pg (ref 26.0–34.0)
MCHC: 33.1 g/dL (ref 32.0–36.0)
Platelet: 193 10*3/uL (ref 150–440)
RDW: 13.9 % (ref 11.5–14.5)
WBC: 4.7 10*3/uL (ref 3.6–11.0)

## 2012-05-24 LAB — BASIC METABOLIC PANEL
Anion Gap: 4 — ABNORMAL LOW (ref 7–16)
BUN: 39 mg/dL — ABNORMAL HIGH (ref 7–18)
Chloride: 109 mmol/L — ABNORMAL HIGH (ref 98–107)
Co2: 24 mmol/L (ref 21–32)
Creatinine: 5.84 mg/dL — ABNORMAL HIGH (ref 0.60–1.30)
EGFR (African American): 9 — ABNORMAL LOW
Potassium: 4.4 mmol/L (ref 3.5–5.1)

## 2012-05-24 LAB — APTT: Activated PTT: 28.7 secs (ref 23.6–35.9)

## 2012-05-25 HISTORY — PX: LEFT HEART CATH AND CORONARY ANGIOGRAPHY: CATH118249

## 2012-05-25 LAB — LIPID PANEL
HDL Cholesterol: 52 mg/dL (ref 40–60)
Ldl Cholesterol, Calc: 71 mg/dL (ref 0–100)
VLDL Cholesterol, Calc: 24 mg/dL (ref 5–40)

## 2012-05-25 LAB — TSH: Thyroid Stimulating Horm: 0.789 u[IU]/mL

## 2012-05-25 LAB — APTT
Activated PTT: 141.7 secs — ABNORMAL HIGH (ref 23.6–35.9)
Activated PTT: >160 secs (ref 23.6–35.9)

## 2012-05-25 LAB — CK TOTAL AND CKMB (NOT AT ARMC)
CK, Total: 65 U/L (ref 21–215)
CK-MB: 1.9 ng/mL (ref 0.5–3.6)

## 2012-05-25 LAB — TROPONIN I: Troponin-I: 0.96 ng/mL — ABNORMAL HIGH

## 2012-05-26 LAB — URINALYSIS, COMPLETE
Blood: NEGATIVE
Glucose,UR: NEGATIVE mg/dL (ref 0–75)
Ketone: NEGATIVE
Nitrite: NEGATIVE
Protein: 100
RBC,UR: 1 /HPF (ref 0–5)
Squamous Epithelial: 1
WBC UR: 6 /HPF (ref 0–5)

## 2012-05-26 LAB — BASIC METABOLIC PANEL
Calcium, Total: 7.9 mg/dL — ABNORMAL LOW (ref 8.5–10.1)
Chloride: 102 mmol/L (ref 98–107)
EGFR (African American): 15 — ABNORMAL LOW
EGFR (Non-African Amer.): 13 — ABNORMAL LOW
Glucose: 100 mg/dL — ABNORMAL HIGH (ref 65–99)
Osmolality: 281 (ref 275–301)
Sodium: 139 mmol/L (ref 136–145)

## 2012-05-30 ENCOUNTER — Ambulatory Visit: Payer: Self-pay | Admitting: Vascular Surgery

## 2012-07-27 IMAGING — CR DG CHEST 1V PORT
1 series · 1 of 1 positions shown · non-contrast
Comparison: none

REASON FOR EXAM: fever
COMMENTS:

[view not recorded]
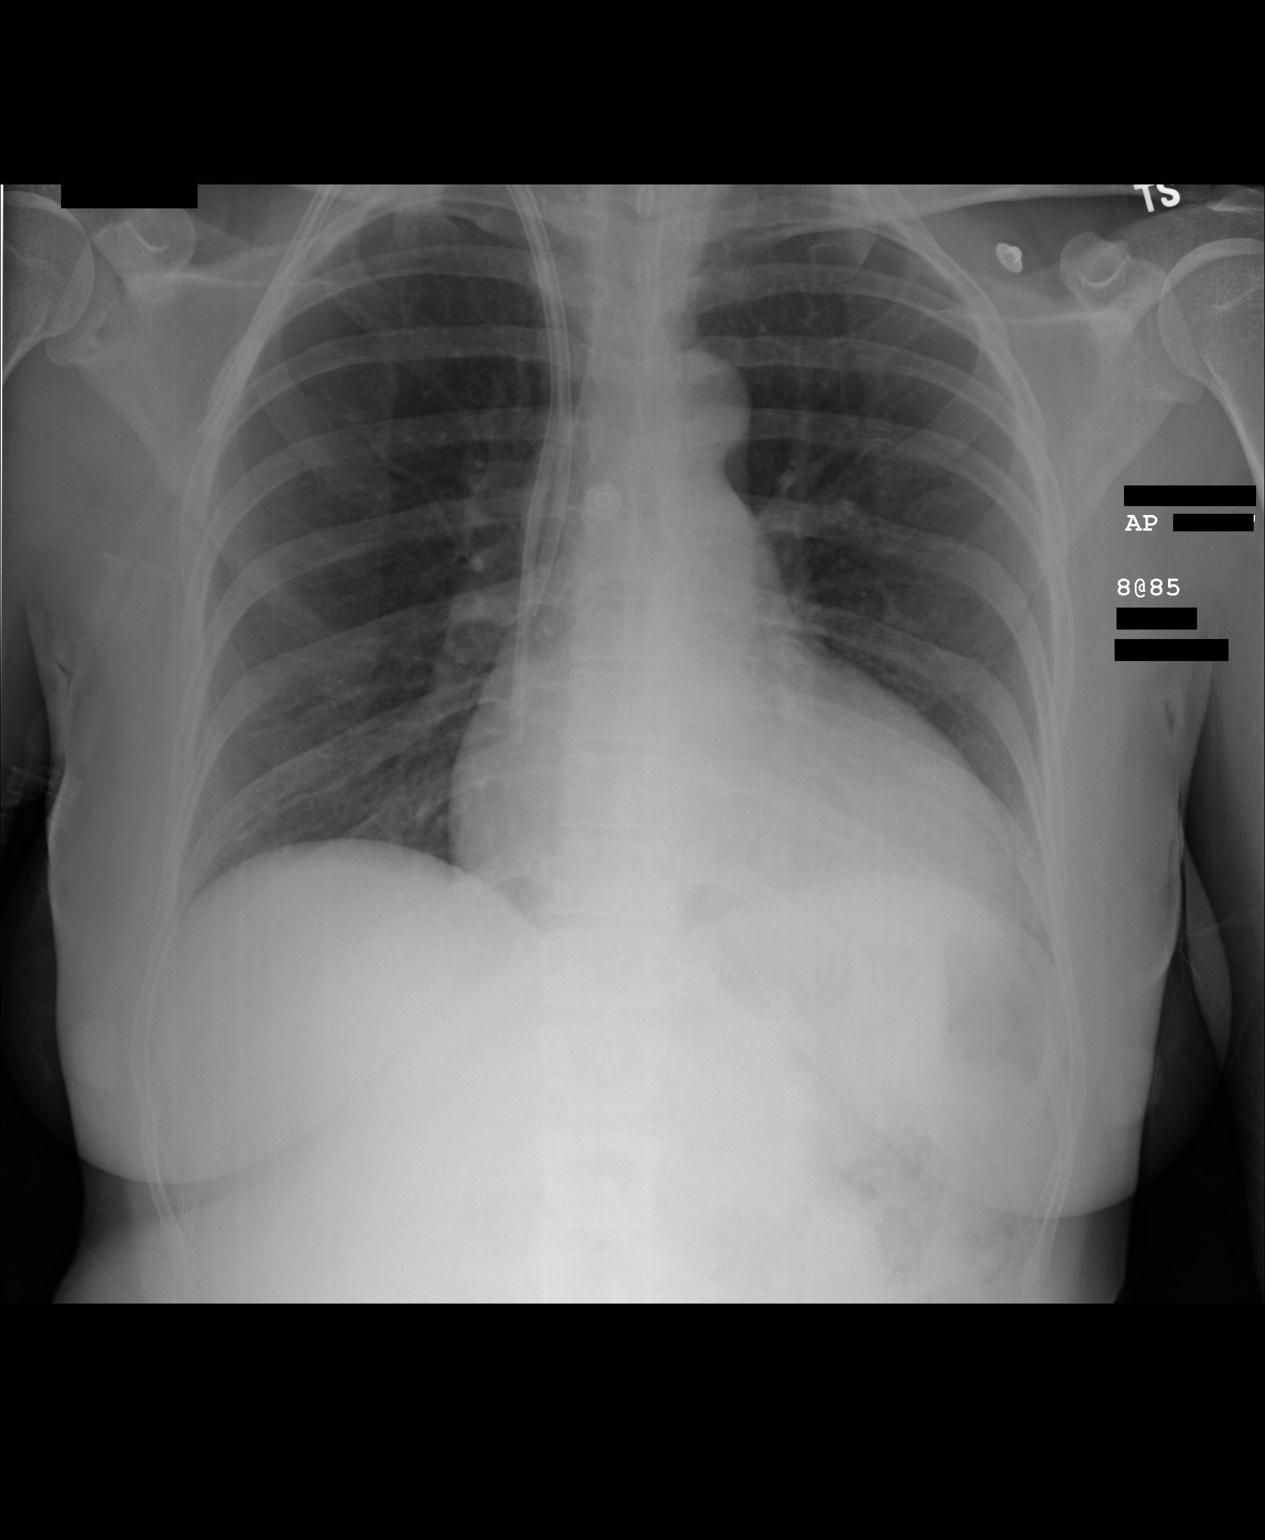

[1 of 1 positions shown; findings below may reference images not displayed]

PROCEDURE:     DXR - DXR PORTABLE CHEST SINGLE VIEW  - August 13, 2010 [DATE]

RESULT:     Comparison is made to a prior exam of 05/21/2010. The current
exam shows the lung fields to be clear. No recurrent pulmonary vascular
congestion or interstitial edema is seen. No pleural effusion is noted. The
heart is mildly enlarged but stable in size as compared to the prior exam. A
dual lumen venous catheter is present. No pneumothorax is seen.
IMPRESSION: 1. The lung fields are clear.
2. There is mild, stable cardiomegaly.
3. A dual lumen venous catheter is present.

## 2012-08-01 IMAGING — XA IR VASCULAR PROCEDURE
1 series · 1 of 1 positions shown · non-contrast
Comparison: none

[Series 3: single · 1 of 1 slices shown]
[im 1/1]
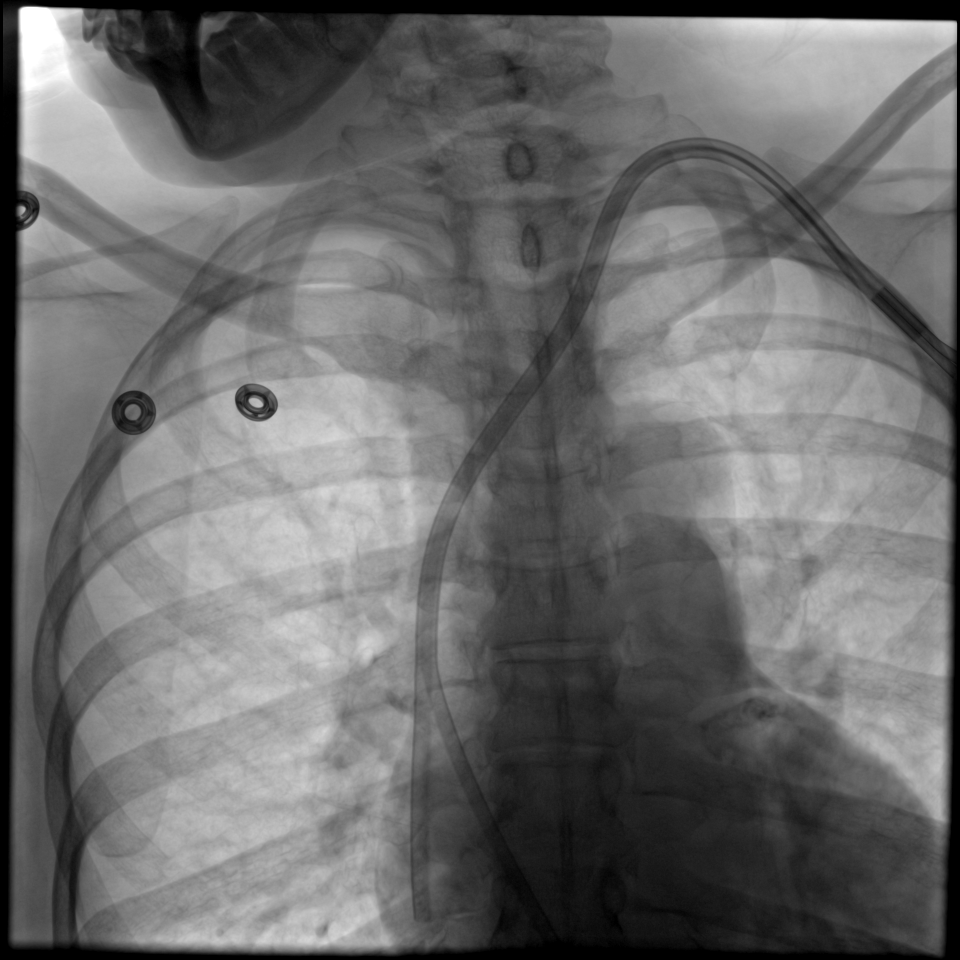

[1 of 1 positions shown; findings below may reference images not displayed]

IMAGES IMPORTED FROM THE [employer] WORKFLOW SYSTEM
NO DICTATION FOR STUDY

## 2012-09-11 ENCOUNTER — Inpatient Hospital Stay: Payer: Self-pay | Admitting: Student

## 2012-09-11 LAB — COMPREHENSIVE METABOLIC PANEL
Albumin: 3.1 g/dL — ABNORMAL LOW (ref 3.4–5.0)
Alkaline Phosphatase: 82 U/L (ref 50–136)
Anion Gap: 9 (ref 7–16)
Co2: 27 mmol/L (ref 21–32)
Glucose: 101 mg/dL — ABNORMAL HIGH (ref 65–99)
Osmolality: 275 (ref 275–301)
Potassium: 4 mmol/L (ref 3.5–5.1)
SGOT(AST): 30 U/L (ref 15–37)
SGPT (ALT): 32 U/L (ref 12–78)
Sodium: 136 mmol/L (ref 136–145)
Total Protein: 7.7 g/dL (ref 6.4–8.2)

## 2012-09-11 LAB — TROPONIN I
Troponin-I: 0.07 ng/mL — ABNORMAL HIGH
Troponin-I: 0.06 ng/mL — ABNORMAL HIGH
Troponin-I: 0.06 ng/mL — ABNORMAL HIGH

## 2012-09-11 LAB — CBC
HCT: 34.2 % — ABNORMAL LOW (ref 35.0–47.0)
HGB: 11.4 g/dL — ABNORMAL LOW (ref 12.0–16.0)
MCH: 32.5 pg (ref 26.0–34.0)
MCHC: 33.2 g/dL (ref 32.0–36.0)
Platelet: 160 10*3/uL (ref 150–440)
RBC: 3.5 10*6/uL — ABNORMAL LOW (ref 3.80–5.20)

## 2012-09-11 LAB — CK TOTAL AND CKMB (NOT AT ARMC)
CK, Total: 45 U/L (ref 21–215)
CK-MB: 1.6 ng/mL (ref 0.5–3.6)

## 2012-09-11 LAB — PRO B NATRIURETIC PEPTIDE: B-Type Natriuretic Peptide: 147939 pg/mL — ABNORMAL HIGH (ref 0–125)

## 2012-09-11 LAB — TSH: Thyroid Stimulating Horm: 2.41 u[IU]/mL

## 2012-09-12 LAB — CBC WITH DIFFERENTIAL/PLATELET
Basophil #: 0 10*3/uL (ref 0.0–0.1)
Eosinophil %: 4.7 %
HCT: 32.5 % — ABNORMAL LOW (ref 35.0–47.0)
Lymphocyte %: 37.8 %
MCH: 33.2 pg (ref 26.0–34.0)
MCV: 98 fL (ref 80–100)
Monocyte %: 14.9 %
Neutrophil #: 2 10*3/uL (ref 1.4–6.5)
Neutrophil %: 42 %
Platelet: 134 10*3/uL — ABNORMAL LOW (ref 150–440)
WBC: 4.8 10*3/uL (ref 3.6–11.0)

## 2012-09-12 LAB — BASIC METABOLIC PANEL
Anion Gap: 5 — ABNORMAL LOW (ref 7–16)
BUN: 18 mg/dL (ref 7–18)
Co2: 31 mmol/L (ref 21–32)
EGFR (African American): 14 — ABNORMAL LOW
EGFR (Non-African Amer.): 12 — ABNORMAL LOW
Glucose: 85 mg/dL (ref 65–99)
Potassium: 4.5 mmol/L (ref 3.5–5.1)

## 2012-09-12 LAB — TROPONIN I: Troponin-I: 0.07 ng/mL — ABNORMAL HIGH

## 2012-09-13 LAB — POTASSIUM: Potassium: 3.8 mmol/L (ref 3.5–5.1)

## 2012-09-13 LAB — PHOSPHORUS: Phosphorus: 4.7 mg/dL (ref 2.5–4.9)

## 2012-09-14 LAB — URINALYSIS, COMPLETE
Bilirubin,UR: NEGATIVE
Ketone: NEGATIVE
Protein: >=500
RBC,UR: 52 /HPF (ref 0–5)
WBC UR: 172 /HPF (ref 0–5)

## 2012-09-18 LAB — CULTURE, BLOOD (SINGLE)

## 2012-09-21 IMAGING — XA IR VASCULAR PROCEDURE
9 of 10 series · 15 of 19 positions shown · IV contrast (IODINE)
Comparison: none

[Series 1: care upper arm · 2 of 2 slices shown (1 of 8)]
[im 1/2]
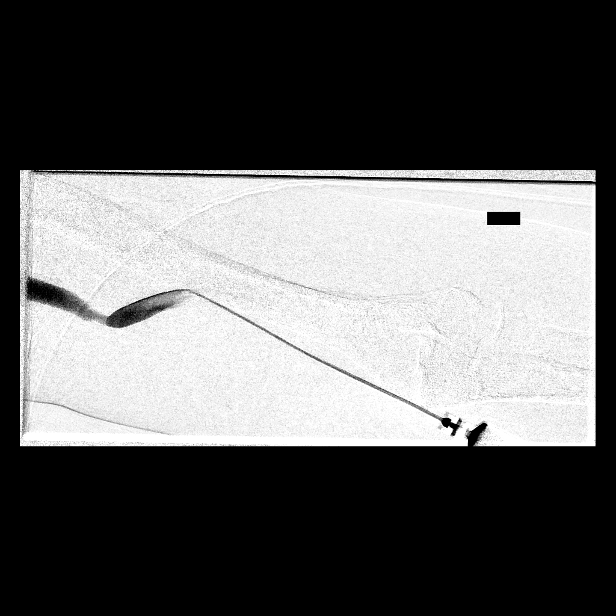
[im 2/2]
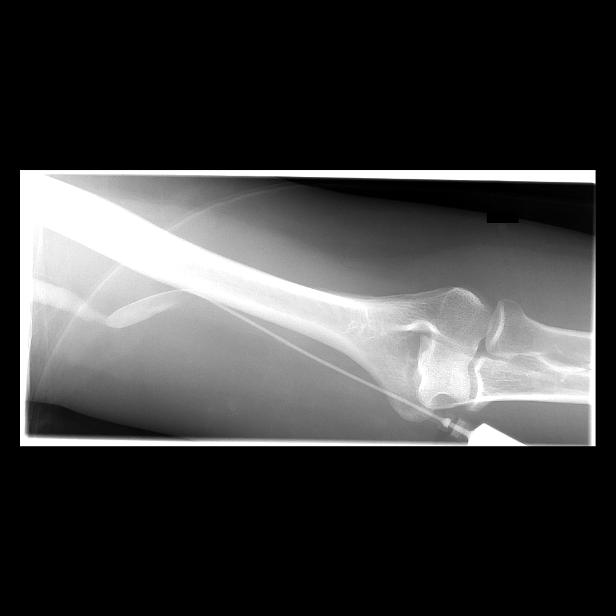

[Series 2: care upper arm · 2 of 3 slices shown (2 of 8)]
[im 2/3]
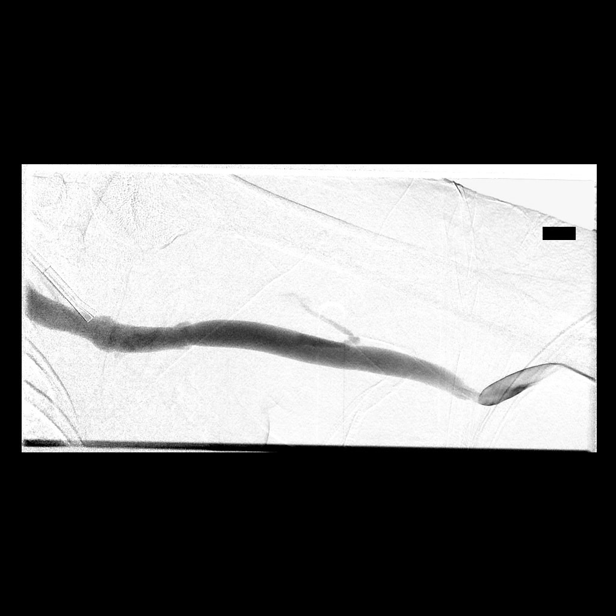
[im 3/3]
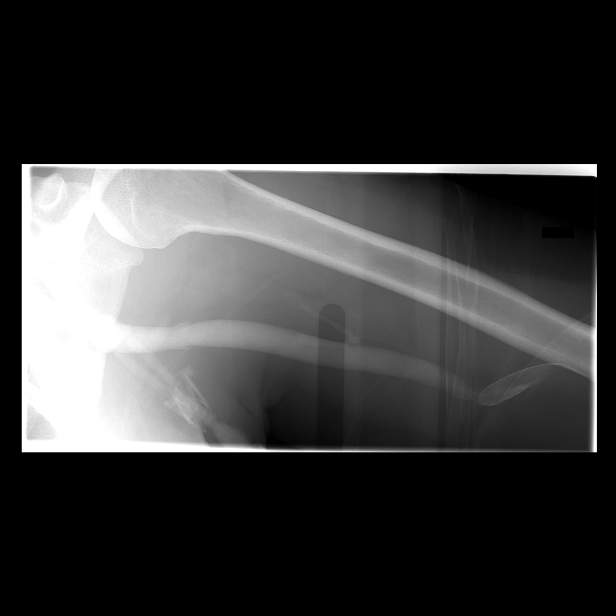

[Series 3: care upper arm · 2 of 2 slices shown (3 of 8)]
[im 1/2]
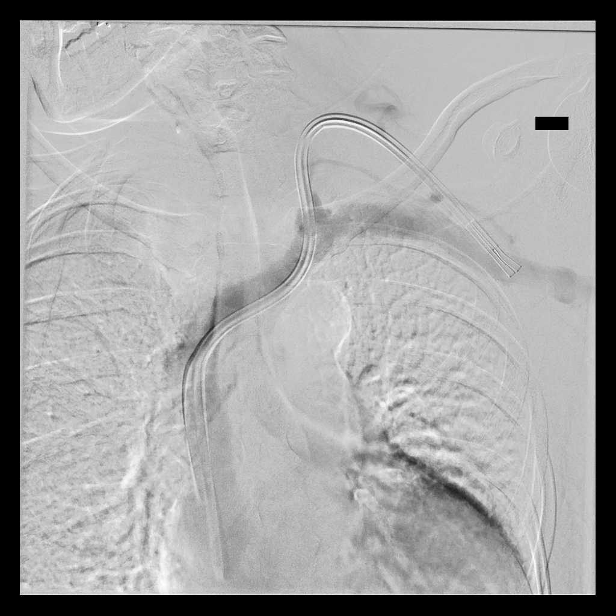
[im 2/2]
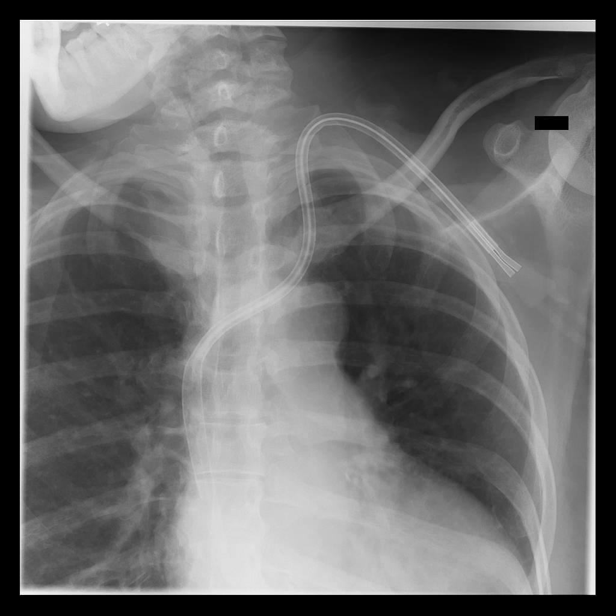

[Series 4: care upper arm · 1 of 2 slices shown (4 of 8)]
[im 2/2]
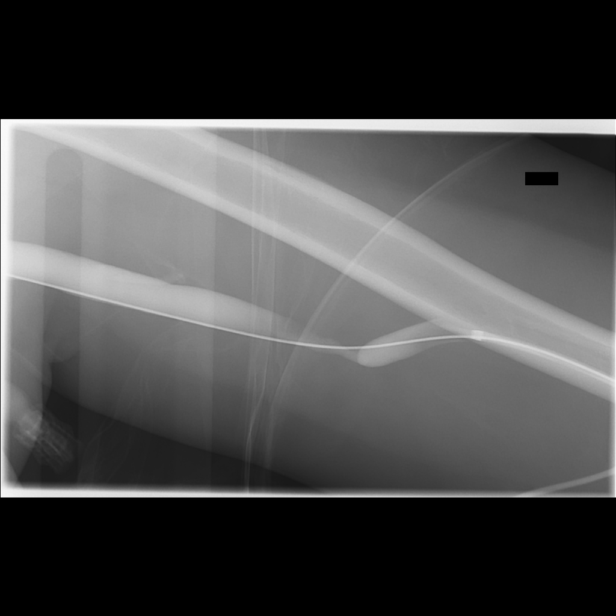

[Series 5: fl - angio · 1 of 1 slices shown]
[im 1/1]
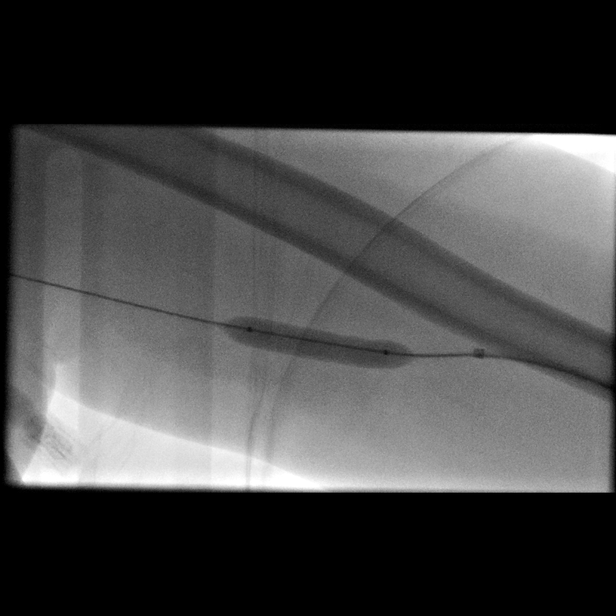

[Series 6: care upper arm · 1 of 2 slices shown (5 of 8)]
[im 1/2]
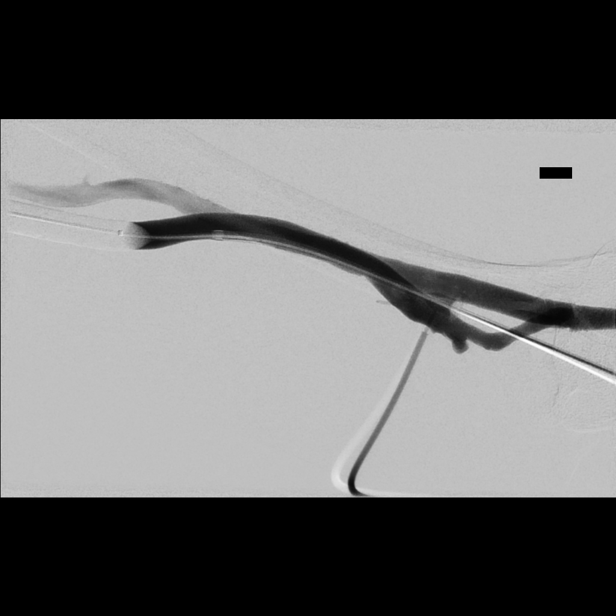

[Series 7: care upper arm · 2 of 2 slices shown (6 of 8)]
[im 1/2]
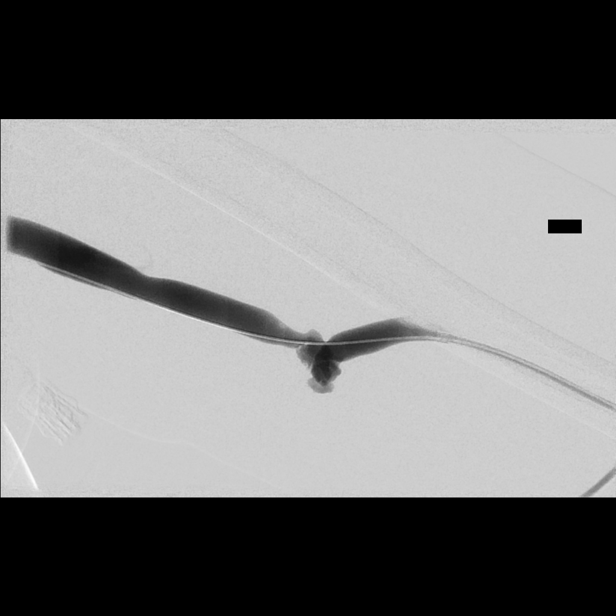
[im 2/2]
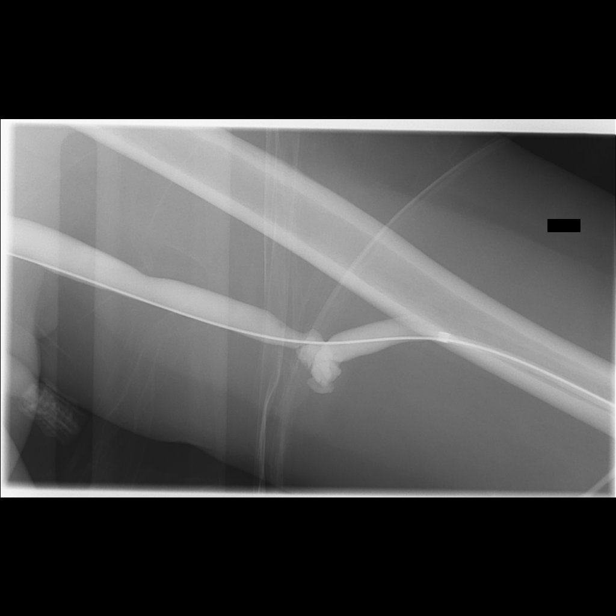

[Series 8: care upper arm · 2 of 2 slices shown (7 of 8)]
[im 1/2]
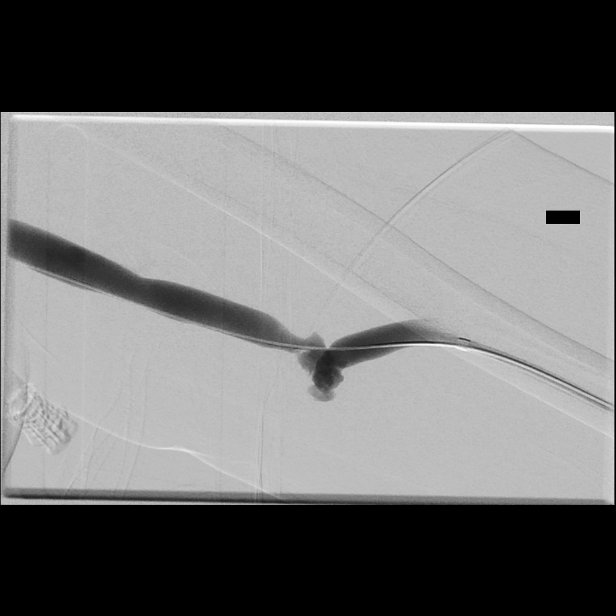
[im 2/2]
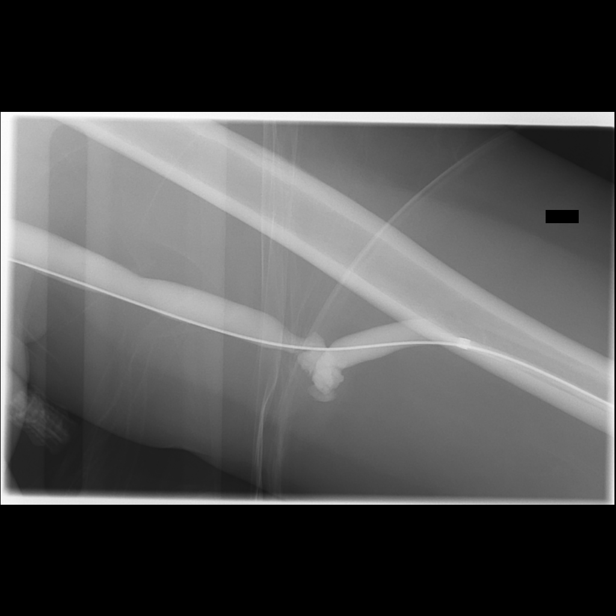

[Series 10: care upper arm · 2 of 2 slices shown (8 of 8)]
[im 1/2]
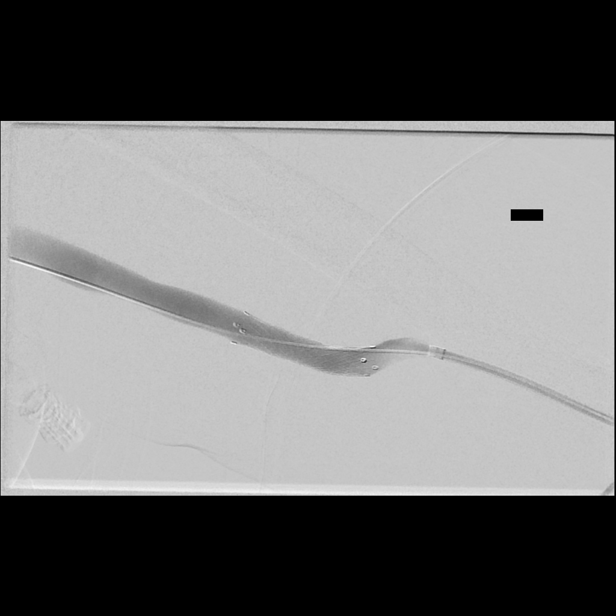
[im 2/2]
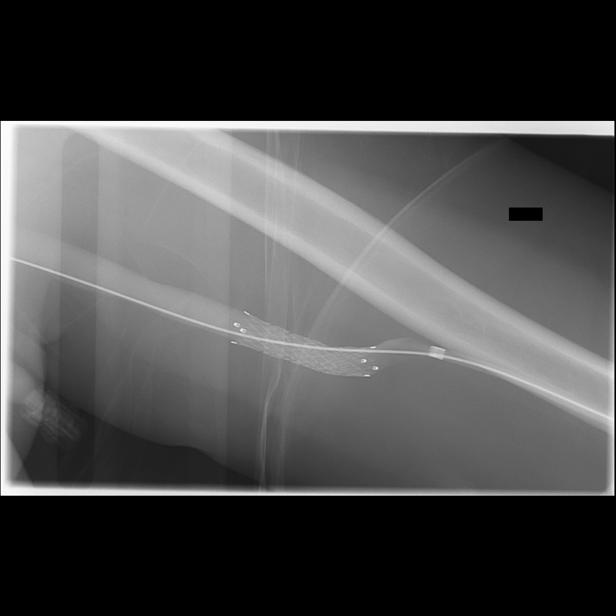

[15 of 19 positions shown; findings below may reference images not displayed]

IMAGES IMPORTED FROM THE [employer] WORKFLOW SYSTEM
NO DICTATION FOR STUDY

## 2012-09-27 IMAGING — CR DG CHEST 1V PORT
1 series · 1 of 1 positions shown · non-contrast
Comparison: none

REASON FOR EXAM: cp
COMMENTS:

[view not recorded]
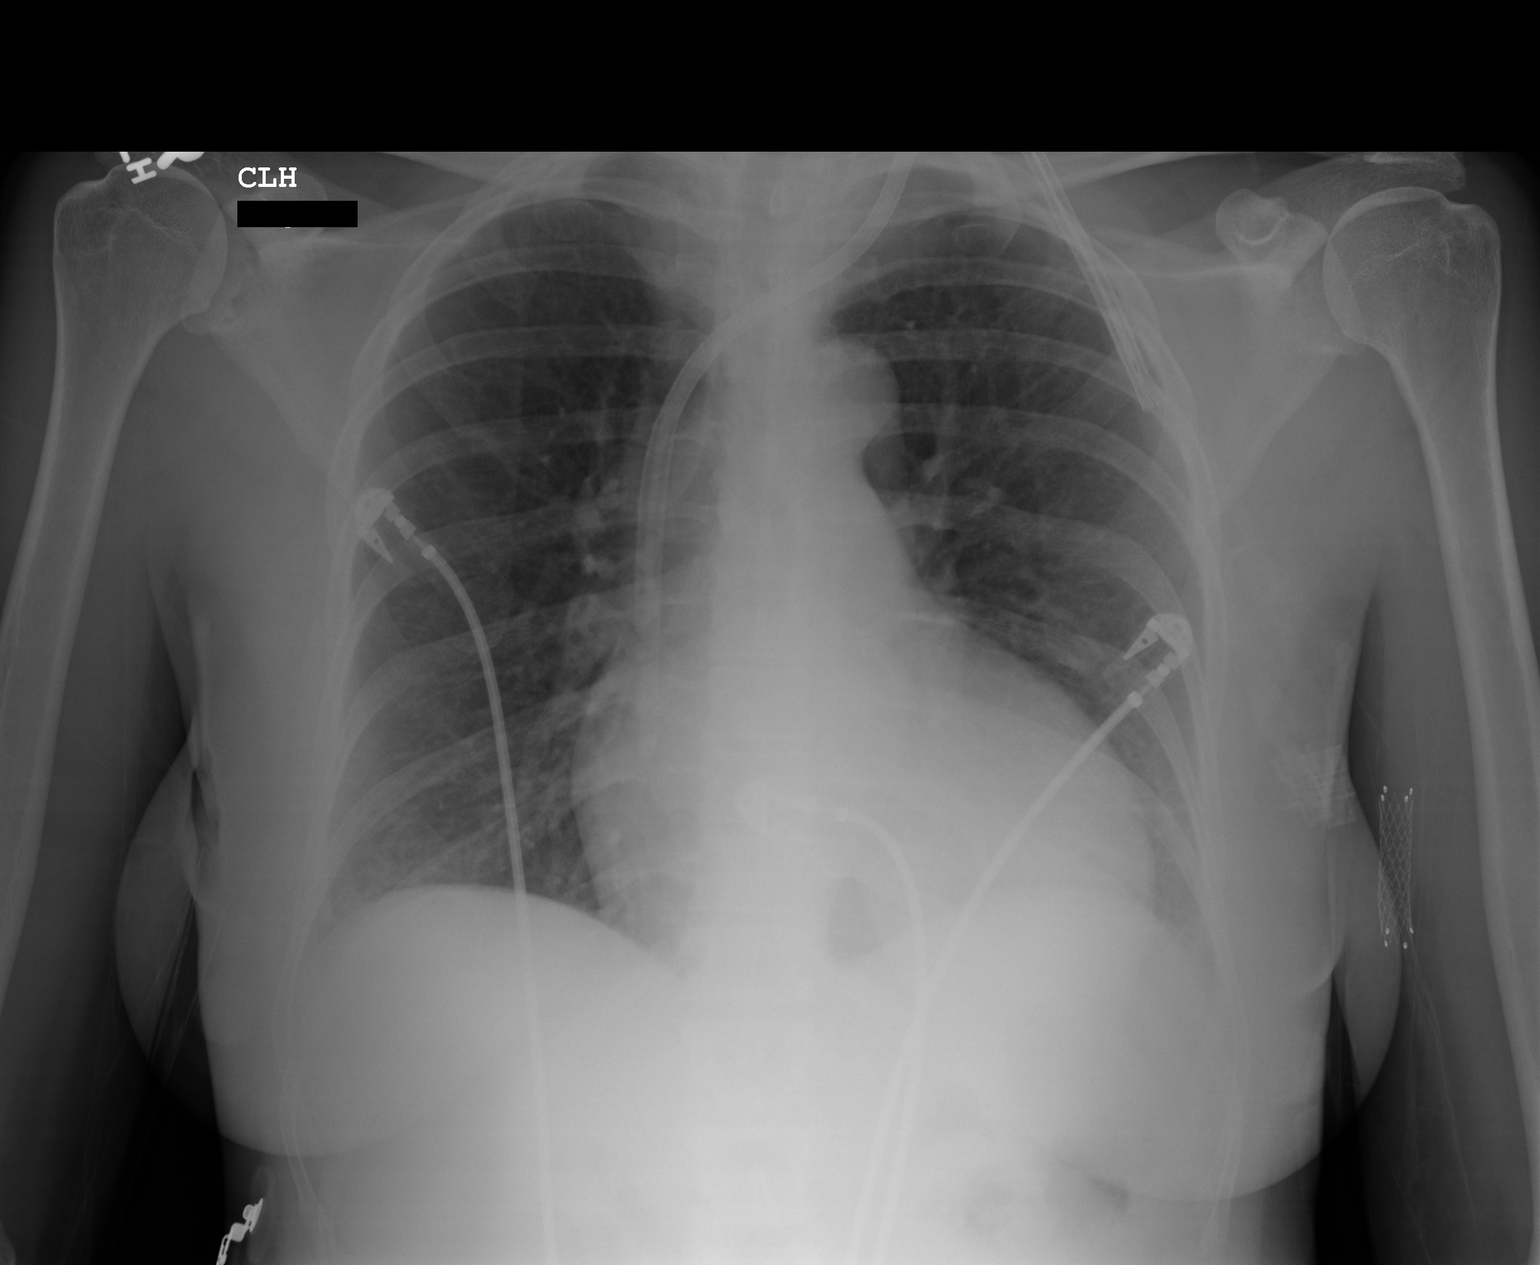

[1 of 1 positions shown; findings below may reference images not displayed]

PROCEDURE:     DXR - DXR PORTABLE CHEST SINGLE VIEW  - October 14, 2010  [DATE]

RESULT:     Comparison is made to the prior exam of 08/13/2010.

A dual lumen venous catheter is again observed. The lung fields are clear.
No pneumonia, pneumothorax or pleural effusion is seen. The heart is mildly
enlarged but is less prominent than on the exam of 08/13/2010. Monitoring
electrodes are present.
IMPRESSION: 1.  No acute changes are identified.
2.  Mild cardiomegaly.

## 2012-10-10 ENCOUNTER — Inpatient Hospital Stay: Payer: Self-pay | Admitting: Internal Medicine

## 2012-10-10 LAB — CK TOTAL AND CKMB (NOT AT ARMC)
CK, Total: 33 U/L (ref 21–215)
CK, Total: 37 U/L (ref 21–215)
CK-MB: 1.1 ng/mL (ref 0.5–3.6)

## 2012-10-10 LAB — TROPONIN I
Troponin-I: 0.06 ng/mL — ABNORMAL HIGH
Troponin-I: 0.06 ng/mL — ABNORMAL HIGH

## 2012-10-10 LAB — COMPREHENSIVE METABOLIC PANEL
Alkaline Phosphatase: 74 U/L (ref 50–136)
Anion Gap: 11 (ref 7–16)
Bilirubin,Total: 0.9 mg/dL (ref 0.2–1.0)
Chloride: 100 mmol/L (ref 98–107)
Creatinine: 8.57 mg/dL — ABNORMAL HIGH (ref 0.60–1.30)
EGFR (African American): 6 — ABNORMAL LOW
Osmolality: 287 (ref 275–301)
Potassium: 4.8 mmol/L (ref 3.5–5.1)
SGOT(AST): 38 U/L — ABNORMAL HIGH (ref 15–37)
Total Protein: 7.6 g/dL (ref 6.4–8.2)

## 2012-10-10 LAB — CBC
MCH: 30.2 pg (ref 26.0–34.0)
MCHC: 31.6 g/dL — ABNORMAL LOW (ref 32.0–36.0)
MCV: 96 fL (ref 80–100)
Platelet: 137 10*3/uL — ABNORMAL LOW (ref 150–440)
RBC: 3.67 10*6/uL — ABNORMAL LOW (ref 3.80–5.20)

## 2012-10-10 LAB — PHOSPHORUS: Phosphorus: 5.8 mg/dL — ABNORMAL HIGH (ref 2.5–4.9)

## 2012-10-11 LAB — CBC WITH DIFFERENTIAL/PLATELET
Basophil #: 0 10*3/uL (ref 0.0–0.1)
Basophil %: 0.9 %
HCT: 33.1 % — ABNORMAL LOW (ref 35.0–47.0)
HGB: 10.9 g/dL — ABNORMAL LOW (ref 12.0–16.0)
Lymphocyte #: 1.6 10*3/uL (ref 1.0–3.6)
Lymphocyte %: 34.9 %
MCH: 31 pg (ref 26.0–34.0)
MCHC: 33 g/dL (ref 32.0–36.0)
Monocyte #: 0.7 x10 3/mm (ref 0.2–0.9)
Monocyte %: 14.1 %
Neutrophil #: 2.1 10*3/uL (ref 1.4–6.5)
Neutrophil %: 45.1 %
Platelet: 97 10*3/uL — ABNORMAL LOW (ref 150–440)
RBC: 3.52 10*6/uL — ABNORMAL LOW (ref 3.80–5.20)
RDW: 14.9 % — ABNORMAL HIGH (ref 11.5–14.5)
WBC: 4.7 10*3/uL (ref 3.6–11.0)

## 2012-10-11 LAB — BASIC METABOLIC PANEL
Calcium, Total: 8.4 mg/dL — ABNORMAL LOW (ref 8.5–10.1)
Chloride: 98 mmol/L (ref 98–107)
Co2: 31 mmol/L (ref 21–32)
Creatinine: 5.83 mg/dL — ABNORMAL HIGH (ref 0.60–1.30)
EGFR (African American): 9 — ABNORMAL LOW
EGFR (Non-African Amer.): 8 — ABNORMAL LOW
Osmolality: 277 (ref 275–301)
Potassium: 4.5 mmol/L (ref 3.5–5.1)
Sodium: 135 mmol/L — ABNORMAL LOW (ref 136–145)

## 2012-10-11 LAB — LIPID PANEL
Cholesterol: 70 mg/dL (ref 0–200)
HDL Cholesterol: 32 mg/dL — ABNORMAL LOW (ref 40–60)
Ldl Cholesterol, Calc: 25 mg/dL (ref 0–100)
Triglycerides: 66 mg/dL (ref 0–200)

## 2012-10-11 LAB — PHOSPHORUS: Phosphorus: 4.3 mg/dL (ref 2.5–4.9)

## 2012-10-31 IMAGING — XA IR VASCULAR PROCEDURE
10 of 13 series · 15 of 24 positions shown · IV contrast (IODINE)
Comparison: none

[Series 1: care upper arm · 1 of 2 slices shown (1 of 9)]
[im 1/2]
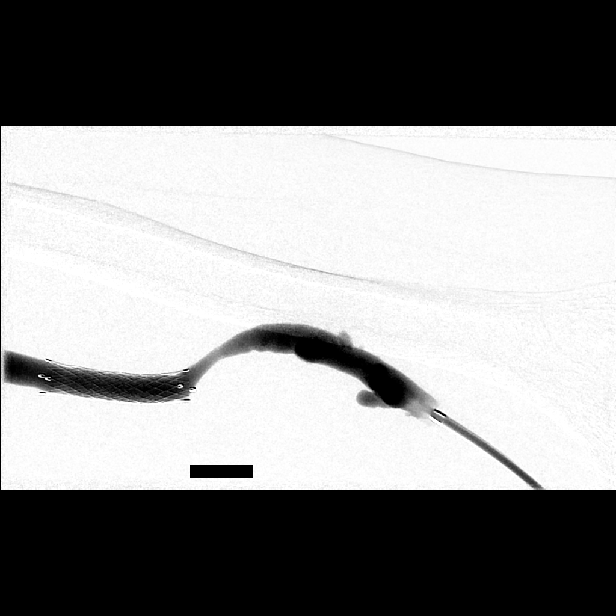

[Series 3: care upper arm · 3 of 4 slices shown (2 of 9)]
[im 1/4]
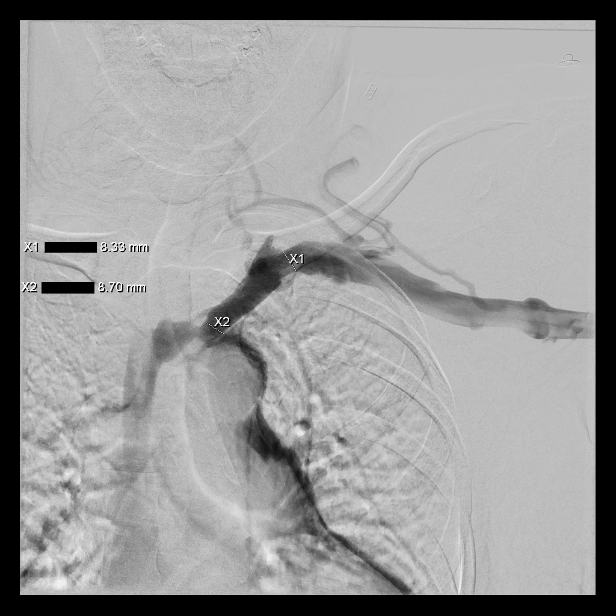
[im 3/4]
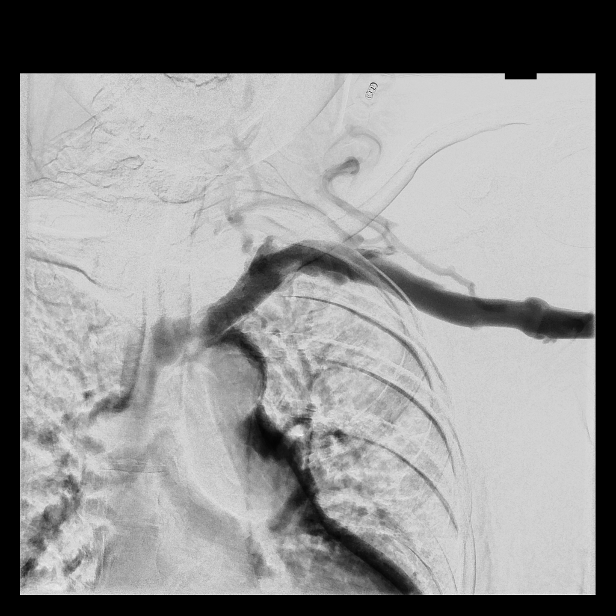
[im 4/4]
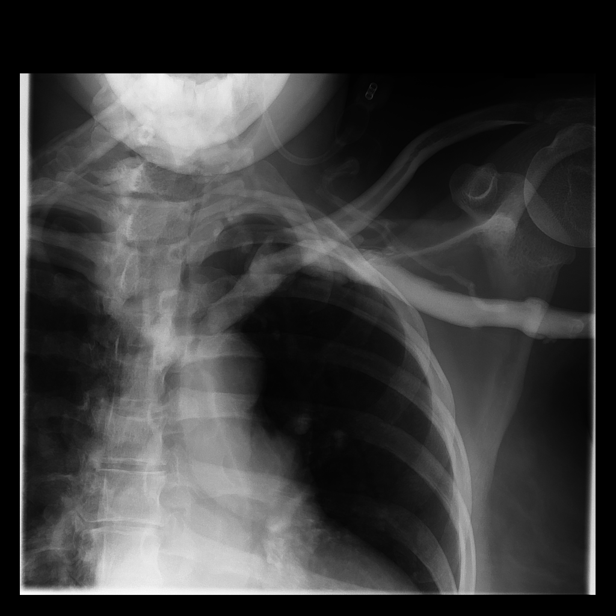

[Series 5: care upper arm · 2 of 2 slices shown (3 of 9)]
[im 1/2]
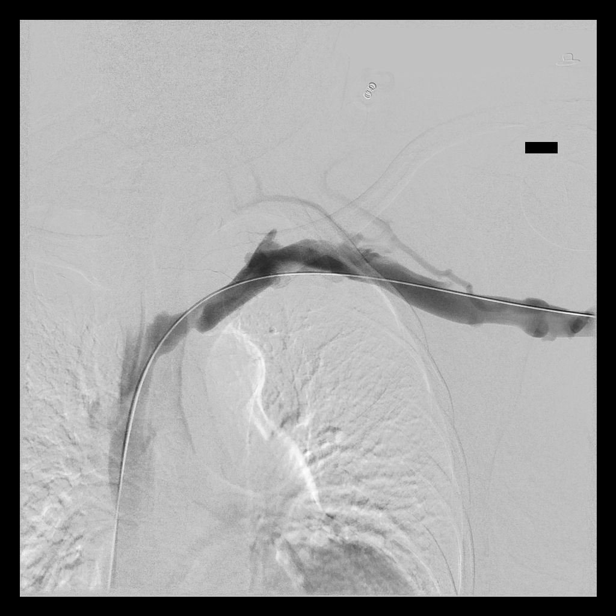
[im 2/2]
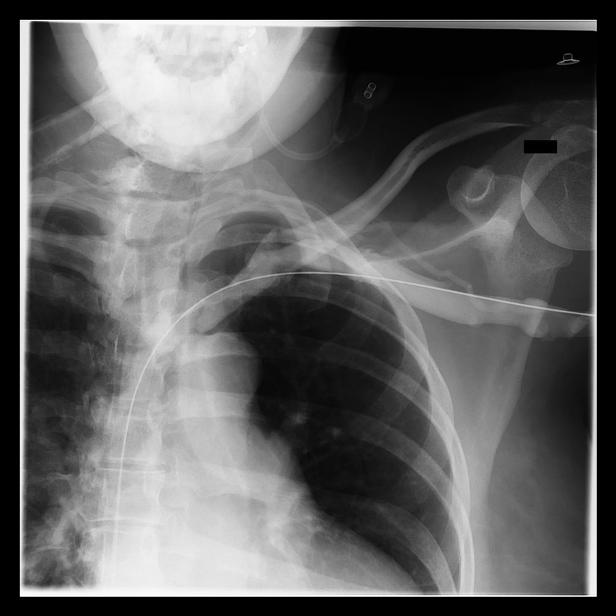

[Series 7: care upper arm · 1 of 2 slices shown (4 of 9)]
[im 1/2]
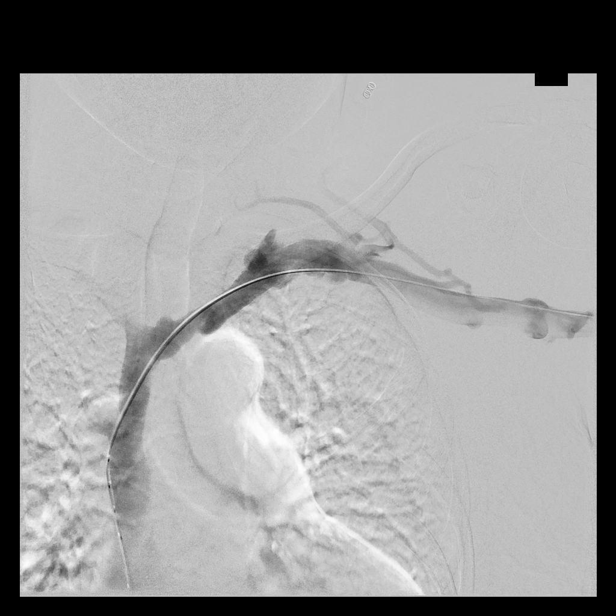

[Series 8: care upper arm · 2 of 3 slices shown (5 of 9)]
[im 1/3]
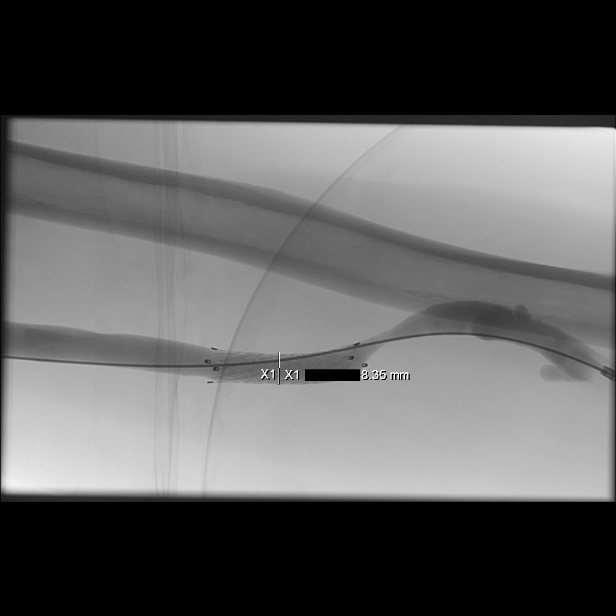
[im 2/3]
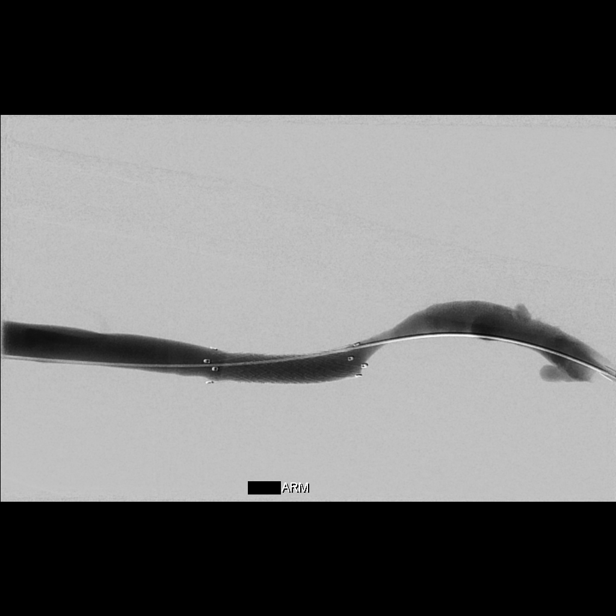

[Series 9: care upper arm · 2 of 2 slices shown (6 of 9)]
[im 1/2]
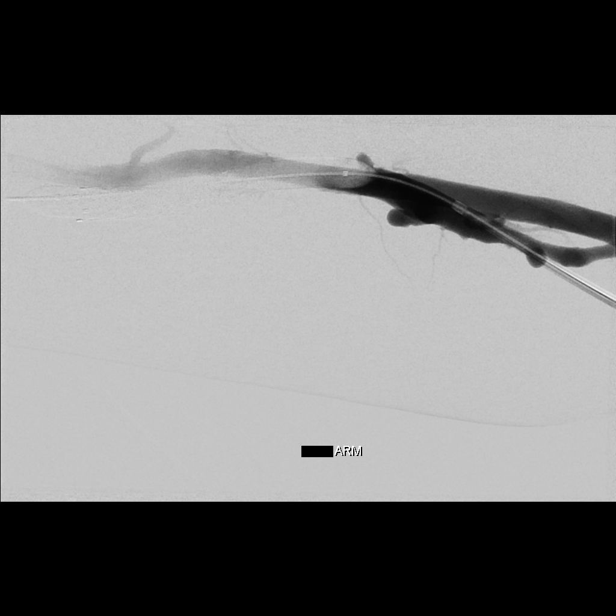
[im 2/2]
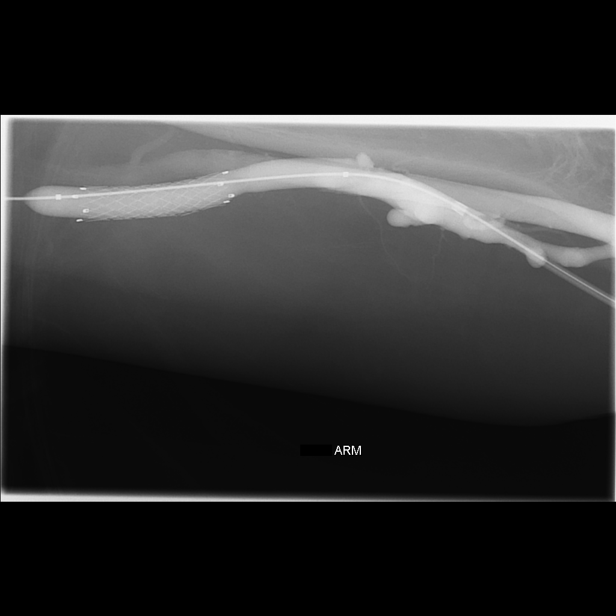

[Series 10: care upper arm · 1 of 2 slices shown (7 of 9)]
[im 2/2]
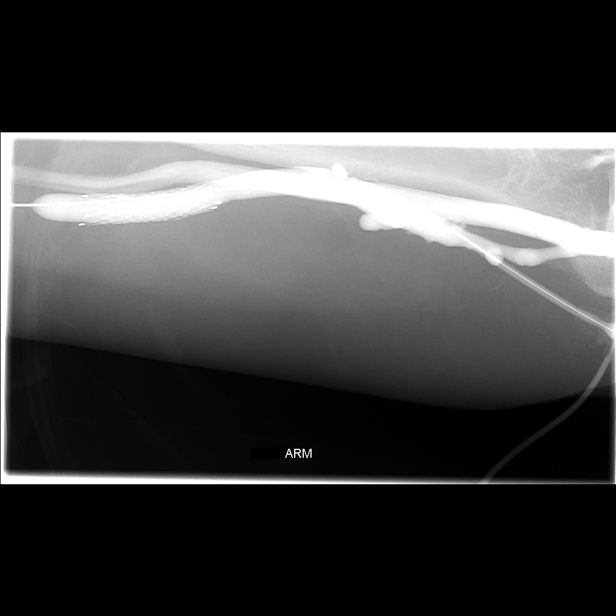

[Series 11: care upper arm · 1 of 2 slices shown (8 of 9)]
[im 2/2]
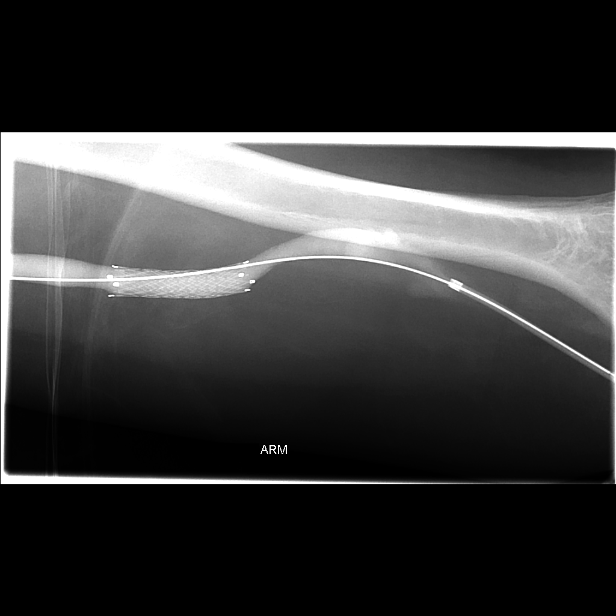

[Series 12: fl - angio · 1 of 1 slices shown]
[im 1/1]
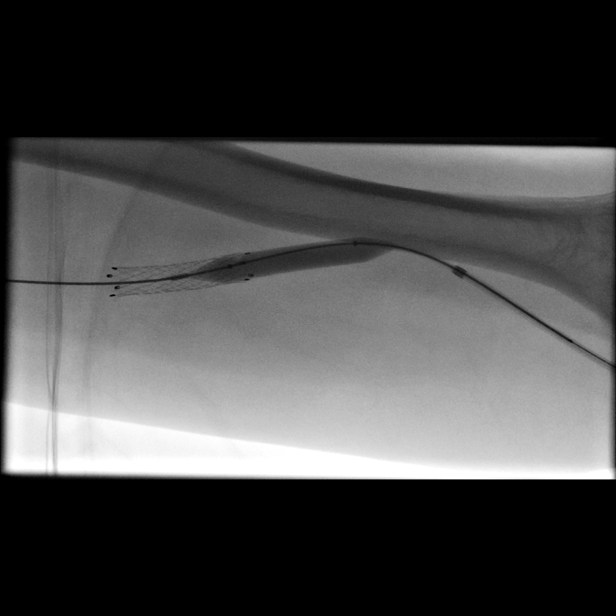

[Series 13: care upper arm · 1 of 2 slices shown (9 of 9)]
[im 2/2]
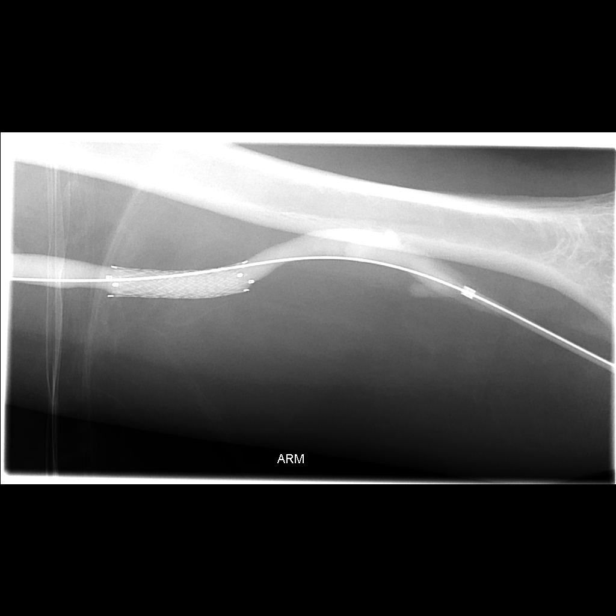

[15 of 24 positions shown; findings below may reference images not displayed]

IMAGES IMPORTED FROM THE [employer] WORKFLOW SYSTEM
NO DICTATION FOR STUDY

## 2012-11-09 ENCOUNTER — Ambulatory Visit: Payer: Self-pay | Admitting: Vascular Surgery

## 2012-12-23 ENCOUNTER — Ambulatory Visit: Payer: Self-pay | Admitting: Gastroenterology

## 2012-12-26 ENCOUNTER — Ambulatory Visit: Payer: Self-pay | Admitting: Internal Medicine

## 2013-01-30 ENCOUNTER — Ambulatory Visit: Payer: Self-pay | Admitting: Vascular Surgery

## 2013-02-09 LAB — COMPREHENSIVE METABOLIC PANEL
Albumin: 3.1 g/dL — ABNORMAL LOW (ref 3.4–5.0)
Anion Gap: 8 (ref 7–16)
BUN: 13 mg/dL (ref 7–18)
Bilirubin,Total: 0.6 mg/dL (ref 0.2–1.0)
Calcium, Total: 8.4 mg/dL — ABNORMAL LOW (ref 8.5–10.1)
Co2: 29 mmol/L (ref 21–32)
EGFR (Non-African Amer.): 11 — ABNORMAL LOW
Glucose: 105 mg/dL — ABNORMAL HIGH (ref 65–99)
Potassium: 3.7 mmol/L (ref 3.5–5.1)
SGPT (ALT): 47 U/L (ref 12–78)

## 2013-02-09 LAB — CBC
HGB: 10 g/dL — ABNORMAL LOW (ref 12.0–16.0)
MCH: 32.5 pg (ref 26.0–34.0)
MCV: 97 fL (ref 80–100)
Platelet: 161 10*3/uL (ref 150–440)
RBC: 3.09 10*6/uL — ABNORMAL LOW (ref 3.80–5.20)

## 2013-02-09 LAB — PROTIME-INR: INR: 1.4

## 2013-02-09 LAB — TROPONIN I: Troponin-I: 0.08 ng/mL — ABNORMAL HIGH

## 2013-02-09 LAB — CK TOTAL AND CKMB (NOT AT ARMC): CK, Total: 91 U/L (ref 21–215)

## 2013-02-10 ENCOUNTER — Observation Stay: Payer: Self-pay | Admitting: Internal Medicine

## 2013-02-10 LAB — CK TOTAL AND CKMB (NOT AT ARMC)
CK, Total: 67 U/L (ref 21–215)
CK-MB: 1.7 ng/mL (ref 0.5–3.6)

## 2013-02-10 LAB — TROPONIN I: Troponin-I: 0.1 ng/mL — ABNORMAL HIGH

## 2013-02-14 ENCOUNTER — Inpatient Hospital Stay: Payer: Self-pay | Admitting: Specialist

## 2013-02-14 LAB — CBC
HCT: 35.2 % (ref 35.0–47.0)
MCH: 33 pg (ref 26.0–34.0)
MCV: 98 fL (ref 80–100)
Platelet: 190 10*3/uL (ref 150–440)
RBC: 3.58 10*6/uL — ABNORMAL LOW (ref 3.80–5.20)
WBC: 5.7 10*3/uL (ref 3.6–11.0)

## 2013-02-14 LAB — COMPREHENSIVE METABOLIC PANEL
Albumin: 4 g/dL (ref 3.4–5.0)
Alkaline Phosphatase: 126 U/L (ref 50–136)
Anion Gap: 16 (ref 7–16)
Bilirubin,Total: 1.8 mg/dL — ABNORMAL HIGH (ref 0.2–1.0)
Chloride: 97 mmol/L — ABNORMAL LOW (ref 98–107)
Co2: 20 mmol/L — ABNORMAL LOW (ref 21–32)
EGFR (Non-African Amer.): 4 — ABNORMAL LOW
Potassium: 6.1 mmol/L — ABNORMAL HIGH (ref 3.5–5.1)
SGPT (ALT): 49 U/L (ref 12–78)
Sodium: 133 mmol/L — ABNORMAL LOW (ref 136–145)

## 2013-02-14 LAB — CK TOTAL AND CKMB (NOT AT ARMC)
CK, Total: 80 U/L (ref 21–215)
CK-MB: 2.3 ng/mL (ref 0.5–3.6)

## 2013-02-14 LAB — PRO B NATRIURETIC PEPTIDE: B-Type Natriuretic Peptide: >175000 pg/mL — ABNORMAL HIGH (ref 0–125)

## 2013-02-14 LAB — TROPONIN I: Troponin-I: 0.07 ng/mL — ABNORMAL HIGH

## 2013-02-14 LAB — PHOSPHORUS: Phosphorus: 5.2 mg/dL — ABNORMAL HIGH (ref 2.5–4.9)

## 2013-02-15 LAB — BASIC METABOLIC PANEL
BUN: 38 mg/dL — ABNORMAL HIGH (ref 7–18)
Co2: 26 mmol/L (ref 21–32)
Creatinine: 5.9 mg/dL — ABNORMAL HIGH (ref 0.60–1.30)
EGFR (African American): 9 — ABNORMAL LOW
EGFR (Non-African Amer.): 8 — ABNORMAL LOW
Glucose: 107 mg/dL — ABNORMAL HIGH (ref 65–99)
Osmolality: 283 (ref 275–301)
Potassium: 4.4 mmol/L (ref 3.5–5.1)

## 2013-02-15 LAB — CBC WITH DIFFERENTIAL/PLATELET
Basophil #: 0.1 10*3/uL (ref 0.0–0.1)
Basophil %: 1 %
HGB: 9.3 g/dL — ABNORMAL LOW (ref 12.0–16.0)
Lymphocyte #: 1.6 10*3/uL (ref 1.0–3.6)
MCH: 33.2 pg (ref 26.0–34.0)
MCHC: 34 g/dL (ref 32.0–36.0)
MCV: 98 fL (ref 80–100)
Monocyte #: 0.6 x10 3/mm (ref 0.2–0.9)
Neutrophil #: 3 10*3/uL (ref 1.4–6.5)
Neutrophil %: 56 %
RDW: 17.4 % — ABNORMAL HIGH (ref 11.5–14.5)
WBC: 5.4 10*3/uL (ref 3.6–11.0)

## 2013-02-15 LAB — TROPONIN I: Troponin-I: 0.09 ng/mL — ABNORMAL HIGH

## 2013-02-16 LAB — BASIC METABOLIC PANEL
BUN: 57 mg/dL — ABNORMAL HIGH (ref 7–18)
Chloride: 100 mmol/L (ref 98–107)
Creatinine: 8.2 mg/dL — ABNORMAL HIGH (ref 0.60–1.30)
EGFR (African American): 6 — ABNORMAL LOW
EGFR (Non-African Amer.): 5 — ABNORMAL LOW
Glucose: 114 mg/dL — ABNORMAL HIGH (ref 65–99)
Osmolality: 289 (ref 275–301)
Sodium: 136 mmol/L (ref 136–145)

## 2013-02-16 LAB — PHOSPHORUS: Phosphorus: 4.7 mg/dL (ref 2.5–4.9)

## 2013-04-11 ENCOUNTER — Emergency Department: Payer: Self-pay | Admitting: Emergency Medicine

## 2013-04-26 ENCOUNTER — Encounter: Payer: Self-pay | Admitting: *Deleted

## 2013-05-03 ENCOUNTER — Ambulatory Visit: Payer: Self-pay | Admitting: Vascular Surgery

## 2013-05-08 ENCOUNTER — Ambulatory Visit: Payer: Self-pay | Admitting: Gastroenterology

## 2013-05-11 ENCOUNTER — Inpatient Hospital Stay: Payer: Self-pay | Admitting: Internal Medicine

## 2013-05-11 ENCOUNTER — Ambulatory Visit: Payer: Self-pay

## 2013-05-11 ENCOUNTER — Ambulatory Visit: Payer: Self-pay | Admitting: Neurology

## 2013-05-11 LAB — CBC
HGB: 11 g/dL — ABNORMAL LOW (ref 12.0–16.0)
Platelet: 188 10*3/uL (ref 150–440)
RBC: 3.39 10*6/uL — ABNORMAL LOW (ref 3.80–5.20)
RDW: 15 % — ABNORMAL HIGH (ref 11.5–14.5)
WBC: 5.3 10*3/uL (ref 3.6–11.0)

## 2013-05-11 LAB — PROTIME-INR
INR: 1.5
Prothrombin Time: 18.3 secs — ABNORMAL HIGH (ref 11.5–14.7)

## 2013-05-11 LAB — BASIC METABOLIC PANEL
BUN: 29 mg/dL — ABNORMAL HIGH (ref 7–18)
Calcium, Total: 7.3 mg/dL — ABNORMAL LOW (ref 8.5–10.1)
Chloride: 97 mmol/L — ABNORMAL LOW (ref 98–107)
Creatinine: 6.13 mg/dL — ABNORMAL HIGH (ref 0.60–1.30)
EGFR (African American): 9 — ABNORMAL LOW
EGFR (Non-African Amer.): 7 — ABNORMAL LOW
Glucose: 84 mg/dL (ref 65–99)
Potassium: 5.6 mmol/L — ABNORMAL HIGH (ref 3.5–5.1)
Sodium: 130 mmol/L — ABNORMAL LOW (ref 136–145)

## 2013-05-11 LAB — CK TOTAL AND CKMB (NOT AT ARMC)
CK, Total: 195 U/L (ref 21–215)
CK-MB: 1.5 ng/mL (ref 0.5–3.6)

## 2013-05-11 LAB — TROPONIN I: Troponin-I: 0.06 ng/mL — ABNORMAL HIGH

## 2013-05-29 ENCOUNTER — Ambulatory Visit (INDEPENDENT_AMBULATORY_CARE_PROVIDER_SITE_OTHER): Payer: Medicare Other | Admitting: General Surgery

## 2013-05-29 ENCOUNTER — Encounter: Payer: Self-pay | Admitting: General Surgery

## 2013-05-29 VITALS — BP 126/82 | HR 72 | Resp 16 | Ht 66.0 in | Wt 161.0 lb

## 2013-05-29 DIAGNOSIS — K8020 Calculus of gallbladder without cholecystitis without obstruction: Secondary | ICD-10-CM

## 2013-05-29 DIAGNOSIS — R072 Precordial pain: Secondary | ICD-10-CM

## 2013-05-29 NOTE — Patient Instructions (Signed)
Patient to be scheduled for a HIDA Scan.

## 2013-05-29 NOTE — Progress Notes (Signed)
Patient ID: [name], female   DOB: June 20, 1960, 52 y.o.   MRN: [id number]  Chief Complaint  Patient presents with  . Other    gallstones    HPI [name] is a 52 y.o. female who presents for an evaluation of gallstones. The patient states she has had abdominal pain for approximately 8 months. The pain is located in the epigastric region. She describes the pain as a stabbing and burning pain. The pain comes and goes. The pain eases with burping. There is no dietary component to her pain. The pain does not radiate to any other area. Nothing makes it better or worse. She states she has had nausea and vomiting for approximately 1 year. She states this happens on a daily basis. She has also had constipation that has started approximately 5 months on and off.   The patient has been on dialysis for at least a year. Likely renal failure secondary to uncontrolled hypertension.  Her mother reports that she is also suffered a stroke in 2011 and possibly a mini stroke in this calendar year.  The patient describes awakening from a sound sleep gasping for air.  The patient is accompanied by her mother, [name] She was present for the interview and exam.  HPI  Past Medical History  Diagnosis Date  . Heart failure   . Renal failure   . Hypertension   . Myocardial infarction   . Hyperlipidemia   . Stroke 2011    Past Surgical History  Procedure Laterality Date  . Dialysis fistula creation    . Coronary angioplasty with stent placement  2013    History reviewed. No pertinent family history.  Social History History  Substance Use Topics  . Smoking status: Current Every Day Smoker -- 20 years    Types: Cigarettes  . Smokeless tobacco: Not on file  . Alcohol Use: No    Allergies  Allergen Reactions  . Morphine And Related Rash  . Latex Other (See Comments)    blisters    Current Outpatient Prescriptions  Medication Sig Dispense Refill  . aspirin 81 MG  tablet Take 81 mg by mouth daily.      . hydrOXYzine (ATARAX/VISTARIL) 25 MG tablet Take 1 tablet by mouth 3 (three) times daily.      . ISOSORBIDE PO Take 1 tablet by mouth daily.      Marland Kitchen losartan (COZAAR) 50 MG tablet Take 1 tablet by mouth daily.      . metoprolol succinate (TOPROL-XL) 50 MG 24 hr tablet Take 1 tablet by mouth daily.      . pantoprazole (PROTONIX) 40 MG tablet Take 1 tablet by mouth daily.      . SENSIPAR 30 MG tablet Take 1 tablet by mouth daily.       No current facility-administered medications for this visit.    Review of Systems Review of Systems  Constitutional: Negative.   Respiratory: Positive for shortness of breath (progressive over the last several months).   Cardiovascular: Negative.   Gastrointestinal: Positive for nausea, vomiting, abdominal pain and constipation.    Blood pressure 126/82, pulse 72, resp. rate 16, height 5\' 6"  (1.676 m), weight 161 lb (73.029 kg), SpO2 95.00%.  Physical Exam Physical Exam  Constitutional: She is oriented to person, place, and time. She appears well-developed and well-nourished.  Neck: No thyromegaly present.  Cardiovascular: Normal rate, regular rhythm and normal heart sounds.   No murmur heard. Jugular venous distention is noted bilaterally with  the patient sitting erect.  No dyspnea while supine.  Pulmonary/Chest: Effort normal and breath sounds normal.  Abdominal: Soft. Normal appearance and bowel sounds are normal. There is no hepatosplenomegaly. There is no tenderness. A hernia (small umbilical hernia present.) is present.  Lymphadenopathy:    She has no cervical adenopathy.  Neurological: She is alert and oriented to person, place, and time.  Skin: Skin is warm and dry.    Data Reviewed Upper GI series of 05/08/2013 was severely compromised due to the patient's difficulty with swallowing barium to nausea and vomiting developed after the ingestion of gas crystals. There is evidence of impingement of the  heart the third portion of the esophagus. No definitive abnormality was reported.  Abdominal ultrasound dated 12/23/2012 obtained for ova the liver function studies showed a 3 mm nonmobile gallstone. Common bile duct was 3.1 mm. No other sonographic abnormalities.  GI notes of 04/26/2013 were reviewed. This describes an upper endoscopy with evidence of reflux esophagitis. Dysphagia without resolution and dilatation. Weight loss by report.    Assessment    Nonmobile gallstones.  Significant cardiopulmonary compromise.  Dysphasia symptoms, possibly aggravated by cardiomegaly with compression of the distal esophagus.    Plan    Unless the patient shows evidence of a nonfunctioning gallbladder on HIDA scan, I think there is little likelihood that elective cholecystectomy will improve her dysphasia symptoms and retrosternal pain.    Patient has been scheduled for a HIDA scan without CCK at [facility] for 06-05-13 at 1 pm (arrive 12:45 pm). Prep: nothing to eat or drink after midnight including medications, no smoking, no gum, and no narcotic pain medications the day of exam.    Robert Bellow 05/30/2013, 1:37 PM

## 2013-05-30 ENCOUNTER — Telehealth: Payer: Self-pay | Admitting: *Deleted

## 2013-05-30 ENCOUNTER — Other Ambulatory Visit: Payer: Self-pay | Admitting: *Deleted

## 2013-05-30 ENCOUNTER — Ambulatory Visit: Payer: Self-pay

## 2013-05-30 DIAGNOSIS — R109 Unspecified abdominal pain: Secondary | ICD-10-CM

## 2013-05-30 DIAGNOSIS — R072 Precordial pain: Secondary | ICD-10-CM | POA: Insufficient documentation

## 2013-05-30 NOTE — Telephone Encounter (Signed)
Patient reports last menstrual period was 3 years ago. She is aware of HIDA scan and all instructions which is currently scheduled for 06-05-13 at [facility]. Patient instructed to call the office if she has further questions.

## 2013-05-30 NOTE — Telephone Encounter (Signed)
Message for patient to call the office.   We need to inform her of scheduled HIDA scan and find out her LMP.

## 2013-06-05 ENCOUNTER — Ambulatory Visit: Payer: Self-pay | Admitting: General Surgery

## 2013-06-17 ENCOUNTER — Telehealth: Payer: Self-pay | Admitting: General Surgery

## 2013-06-17 NOTE — Telephone Encounter (Signed)
Notified HIDA showed no evidence of cholecystitis. (CCK not administered because of known gallstones).  With patent cystic duct, I don't think her gallstones are the source of her chronic epigastric/ retrosternal pain.  She will continue under the care of Dr. Brynda Greathouse.

## 2013-07-26 ENCOUNTER — Ambulatory Visit: Payer: Self-pay | Admitting: Family

## 2013-10-07 LAB — COMPREHENSIVE METABOLIC PANEL
ALT: 51 U/L (ref 12–78)
ANION GAP: 9 (ref 7–16)
Albumin: 3.5 g/dL (ref 3.4–5.0)
Alkaline Phosphatase: 155 U/L — ABNORMAL HIGH
BILIRUBIN TOTAL: 0.8 mg/dL (ref 0.2–1.0)
BUN: 33 mg/dL — ABNORMAL HIGH (ref 7–18)
CALCIUM: 8 mg/dL — AB (ref 8.5–10.1)
CO2: 31 mmol/L (ref 21–32)
CREATININE: 5.5 mg/dL — AB (ref 0.60–1.30)
Chloride: 100 mmol/L (ref 98–107)
EGFR (African American): 10 — ABNORMAL LOW
GFR CALC NON AF AMER: 8 — AB
GLUCOSE: 76 mg/dL (ref 65–99)
Osmolality: 285 (ref 275–301)
Potassium: 3.9 mmol/L (ref 3.5–5.1)
SGOT(AST): 64 U/L — ABNORMAL HIGH (ref 15–37)
SODIUM: 140 mmol/L (ref 136–145)
TOTAL PROTEIN: 9.3 g/dL — AB (ref 6.4–8.2)

## 2013-10-07 LAB — CBC
HCT: 36.4 % (ref 35.0–47.0)
HGB: 11.7 g/dL — ABNORMAL LOW (ref 12.0–16.0)
MCH: 32 pg (ref 26.0–34.0)
MCHC: 32.1 g/dL (ref 32.0–36.0)
MCV: 100 fL (ref 80–100)
Platelet: 162 10*3/uL (ref 150–440)
RBC: 3.64 10*6/uL — AB (ref 3.80–5.20)
RDW: 16.5 % — ABNORMAL HIGH (ref 11.5–14.5)
WBC: 4.3 10*3/uL (ref 3.6–11.0)

## 2013-10-07 LAB — TROPONIN I: Troponin-I: 0.08 ng/mL — ABNORMAL HIGH

## 2013-10-08 ENCOUNTER — Inpatient Hospital Stay: Payer: Self-pay | Admitting: Internal Medicine

## 2013-10-08 LAB — CK-MB
CK-MB: 2.9 ng/mL (ref 0.5–3.6)
CK-MB: 37 ng/mL — ABNORMAL HIGH (ref 0.5–3.6)
CK-MB: 35.3 ng/mL — AB (ref 0.5–3.6)
CK-MB: 22.8 ng/mL — AB (ref 0.5–3.6)

## 2013-10-08 LAB — ACETAMINOPHEN LEVEL: Acetaminophen: <2 ug/mL

## 2013-10-08 LAB — TROPONIN I
Troponin-I: 8.5 ng/mL — ABNORMAL HIGH
Troponin-I: 12 ng/mL — ABNORMAL HIGH

## 2013-10-08 LAB — SALICYLATE LEVEL: Salicylates, Serum: <1.7 mg/dL

## 2013-10-08 LAB — AMMONIA
AMMONIA, PLASMA: 25 umol/L (ref 11–32)
AMMONIA, PLASMA: 36 umol/L — AB (ref 11–32)

## 2013-10-08 LAB — APTT
ACTIVATED PTT: 58.7 s — AB (ref 23.6–35.9)
Activated PTT: 28.1 secs (ref 23.6–35.9)

## 2013-10-09 HISTORY — PX: LEFT HEART CATH AND CORONARY ANGIOGRAPHY: CATH118249

## 2013-10-09 LAB — BASIC METABOLIC PANEL
ANION GAP: 10 (ref 7–16)
BUN: 69 mg/dL — ABNORMAL HIGH (ref 7–18)
CO2: 29 mmol/L (ref 21–32)
Calcium, Total: 8 mg/dL — ABNORMAL LOW (ref 8.5–10.1)
Chloride: 99 mmol/L (ref 98–107)
Creatinine: 8.19 mg/dL — ABNORMAL HIGH (ref 0.60–1.30)
EGFR (Non-African Amer.): 5 — ABNORMAL LOW
GFR CALC AF AMER: 6 — AB
Glucose: 163 mg/dL — ABNORMAL HIGH (ref 65–99)
OSMOLALITY: 299 (ref 275–301)
Potassium: 4.2 mmol/L (ref 3.5–5.1)
SODIUM: 138 mmol/L (ref 136–145)

## 2013-10-09 LAB — LIPID PANEL
CHOLESTEROL: 103 mg/dL (ref 0–200)
HDL Cholesterol: 37 mg/dL — ABNORMAL LOW (ref 40–60)
Ldl Cholesterol, Calc: 55 mg/dL (ref 0–100)
Triglycerides: 57 mg/dL (ref 0–200)
VLDL CHOLESTEROL, CALC: 11 mg/dL (ref 5–40)

## 2013-10-09 LAB — CBC WITH DIFFERENTIAL/PLATELET
Basophil #: 0 10*3/uL (ref 0.0–0.1)
Basophil %: 0.6 %
EOS PCT: 6 %
Eosinophil #: 0.3 10*3/uL (ref 0.0–0.7)
HCT: 32.3 % — AB (ref 35.0–47.0)
HGB: 10.5 g/dL — AB (ref 12.0–16.0)
LYMPHS PCT: 18.9 %
Lymphocyte #: 0.9 10*3/uL — ABNORMAL LOW (ref 1.0–3.6)
MCH: 32.1 pg (ref 26.0–34.0)
MCHC: 32.3 g/dL (ref 32.0–36.0)
MCV: 99 fL (ref 80–100)
MONOS PCT: 12.6 %
Monocyte #: 0.6 x10 3/mm (ref 0.2–0.9)
Neutrophil #: 2.9 10*3/uL (ref 1.4–6.5)
Neutrophil %: 61.9 %
Platelet: 154 10*3/uL (ref 150–440)
RBC: 3.26 10*6/uL — AB (ref 3.80–5.20)
RDW: 17 % — AB (ref 11.5–14.5)
WBC: 4.7 10*3/uL (ref 3.6–11.0)

## 2013-10-09 LAB — RENAL FUNCTION PANEL
Albumin: 2.7 g/dL — ABNORMAL LOW (ref 3.4–5.0)
Anion Gap: 14 (ref 7–16)
BUN: 71 mg/dL — ABNORMAL HIGH (ref 7–18)
CALCIUM: 8 mg/dL — AB (ref 8.5–10.1)
CHLORIDE: 99 mmol/L (ref 98–107)
CREATININE: 8.46 mg/dL — AB (ref 0.60–1.30)
Co2: 23 mmol/L (ref 21–32)
GFR CALC AF AMER: 6 — AB
GFR CALC NON AF AMER: 5 — AB
Glucose: 153 mg/dL — ABNORMAL HIGH (ref 65–99)
OSMOLALITY: 296 (ref 275–301)
POTASSIUM: 4.4 mmol/L (ref 3.5–5.1)
Phosphorus: 4.4 mg/dL (ref 2.5–4.9)
Sodium: 136 mmol/L (ref 136–145)

## 2013-10-09 LAB — APTT: Activated PTT: 93 secs — ABNORMAL HIGH (ref 23.6–35.9)

## 2013-10-10 LAB — CLOSTRIDIUM DIFFICILE(ARMC)

## 2013-10-10 LAB — MAGNESIUM: Magnesium: 1.9 mg/dL

## 2013-10-10 LAB — PHOSPHORUS: PHOSPHORUS: 3.9 mg/dL (ref 2.5–4.9)

## 2013-10-13 LAB — CULTURE, BLOOD (SINGLE)

## 2013-11-25 LAB — CBC
HCT: 32.9 % — AB (ref 35.0–47.0)
HGB: 10.7 g/dL — AB (ref 12.0–16.0)
MCH: 32.5 pg (ref 26.0–34.0)
MCHC: 32.5 g/dL (ref 32.0–36.0)
MCV: 100 fL (ref 80–100)
Platelet: 131 10*3/uL — ABNORMAL LOW (ref 150–440)
RBC: 3.29 10*6/uL — ABNORMAL LOW (ref 3.80–5.20)
RDW: 16.4 % — ABNORMAL HIGH (ref 11.5–14.5)
WBC: 5.5 10*3/uL (ref 3.6–11.0)

## 2013-11-26 ENCOUNTER — Inpatient Hospital Stay: Payer: Self-pay | Admitting: Internal Medicine

## 2013-11-26 LAB — BASIC METABOLIC PANEL
Anion Gap: 8 (ref 7–16)
BUN: 28 mg/dL — AB (ref 7–18)
CALCIUM: 8.8 mg/dL (ref 8.5–10.1)
CO2: 30 mmol/L (ref 21–32)
Chloride: 100 mmol/L (ref 98–107)
Creatinine: 5.26 mg/dL — ABNORMAL HIGH (ref 0.60–1.30)
EGFR (Non-African Amer.): 9 — ABNORMAL LOW
GFR CALC AF AMER: 10 — AB
Glucose: 83 mg/dL (ref 65–99)
OSMOLALITY: 280 (ref 275–301)
POTASSIUM: 4.9 mmol/L (ref 3.5–5.1)
SODIUM: 138 mmol/L (ref 136–145)

## 2013-11-26 LAB — PROTIME-INR
INR: 1.3
PROTHROMBIN TIME: 16.4 s — AB (ref 11.5–14.7)

## 2013-11-26 LAB — MAGNESIUM: Magnesium: 2.2 mg/dL

## 2013-11-26 LAB — TROPONIN I
TROPONIN-I: 0.69 ng/mL — AB
TROPONIN-I: 1.14 ng/mL — AB
Troponin-I: 0.17 ng/mL — ABNORMAL HIGH

## 2013-11-26 LAB — APTT: Activated PTT: 29.9 secs (ref 23.6–35.9)

## 2013-11-26 LAB — HEPATIC FUNCTION PANEL A (ARMC)
ALBUMIN: 3.3 g/dL — AB (ref 3.4–5.0)
Alkaline Phosphatase: 138 U/L — ABNORMAL HIGH
Bilirubin, Direct: 0.4 mg/dL — ABNORMAL HIGH (ref 0.00–0.20)
Bilirubin,Total: 0.8 mg/dL (ref 0.2–1.0)
SGOT(AST): 65 U/L — ABNORMAL HIGH (ref 15–37)
SGPT (ALT): 38 U/L (ref 12–78)
TOTAL PROTEIN: 8.4 g/dL — AB (ref 6.4–8.2)

## 2013-11-26 LAB — CK-MB: CK-MB: 3.8 ng/mL — ABNORMAL HIGH (ref 0.5–3.6)

## 2013-11-26 LAB — LIPASE, BLOOD: Lipase: 673 U/L — ABNORMAL HIGH (ref 73–393)

## 2013-11-27 LAB — MAGNESIUM: Magnesium: 2.3 mg/dL

## 2013-11-27 LAB — CK: CK, TOTAL: 58 U/L

## 2013-11-29 IMAGING — XA IR VASCULAR PROCEDURE
12 series · 15 of 17 positions shown · non-contrast
Comparison: none

[Series 1: care aorta · 2 of 2 slices shown (1 of 7)]
[im 1/2]
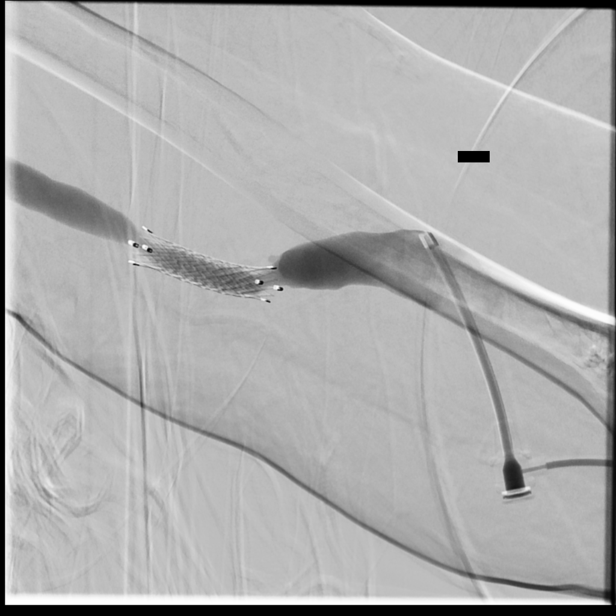
[im 2/2]
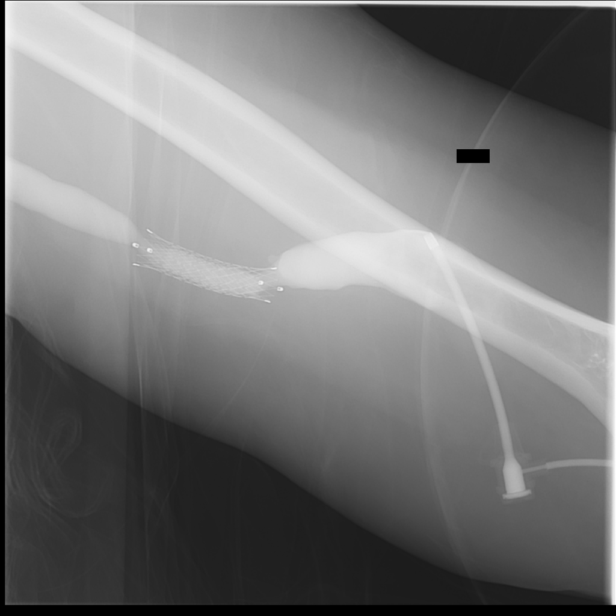

[Series 2: care aorta · 1 of 1 slices shown (2 of 7)]
[im 1/1]
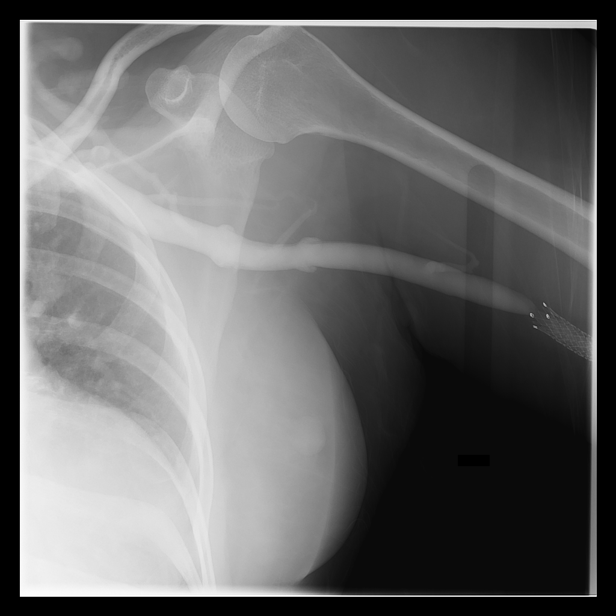

[Series 3: care aorta · 1 of 2 slices shown (3 of 7)]
[im 1/2]
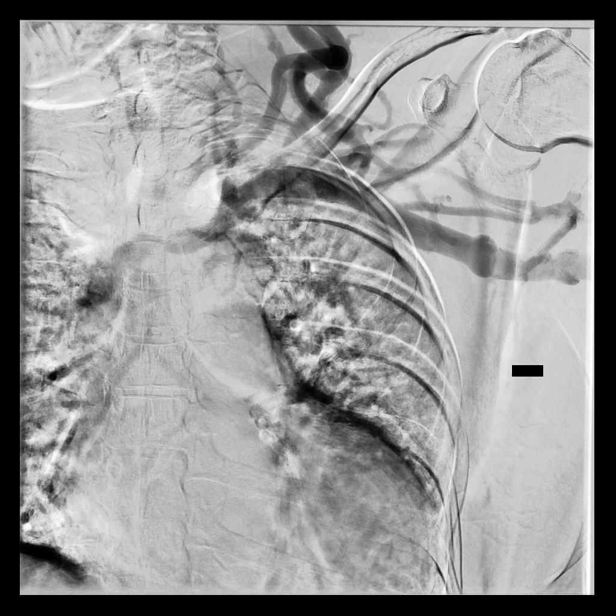

[Series 4: fl - angio · 1 of 1 slices shown (1 of 5)]
[im 1/1]
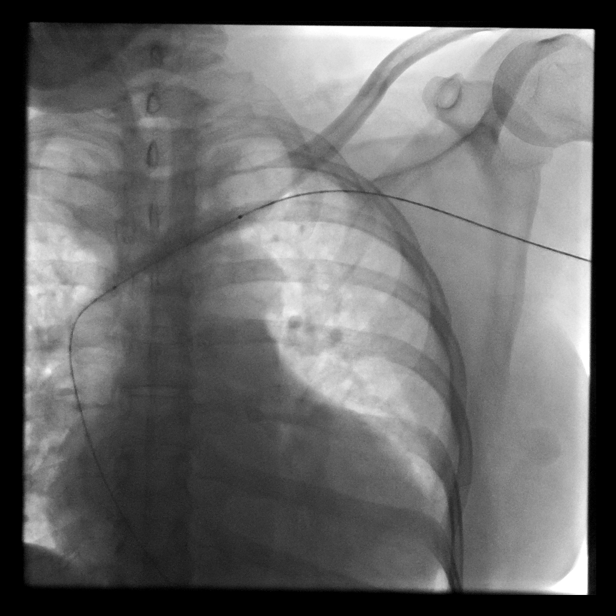

[Series 5: care aorta · 1 of 1 slices shown (4 of 7)]
[im 1/1]
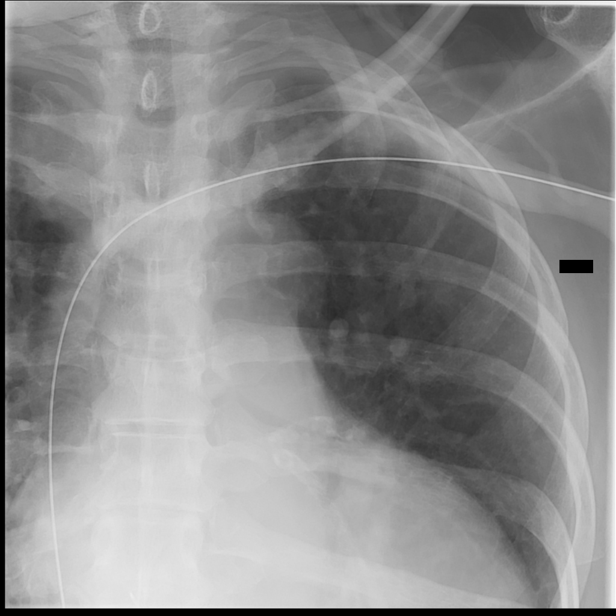

[Series 6: fl - angio · 1 of 1 slices shown (2 of 5)]
[im 1/1]
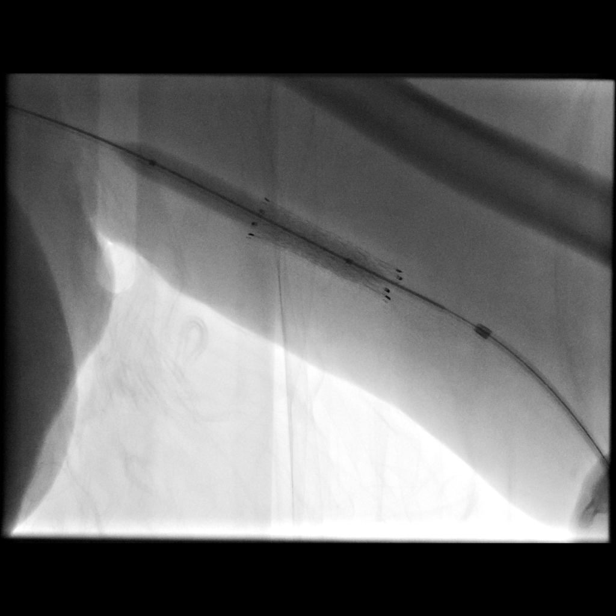

[Series 7: fl - angio · 1 of 1 slices shown (3 of 5)]
[im 1/1]
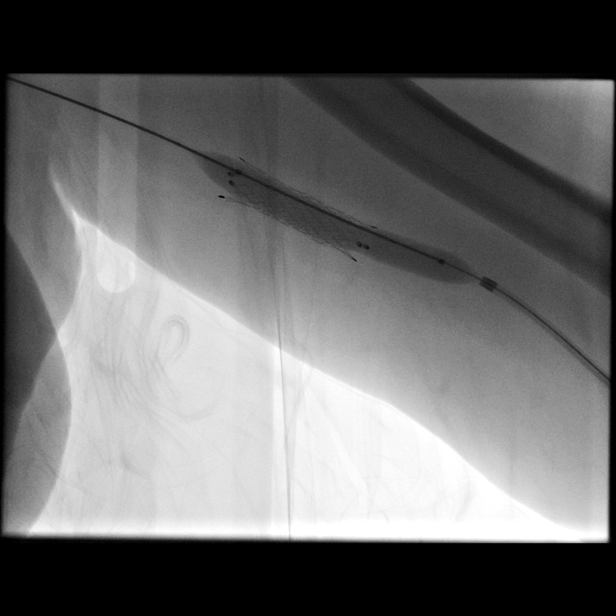

[Series 8: care aorta · 2 of 2 slices shown (5 of 7)]
[im 1/2]
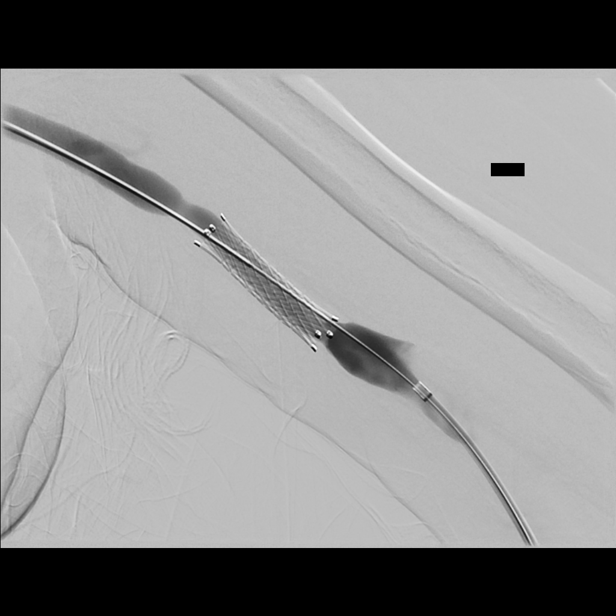
[im 2/2]
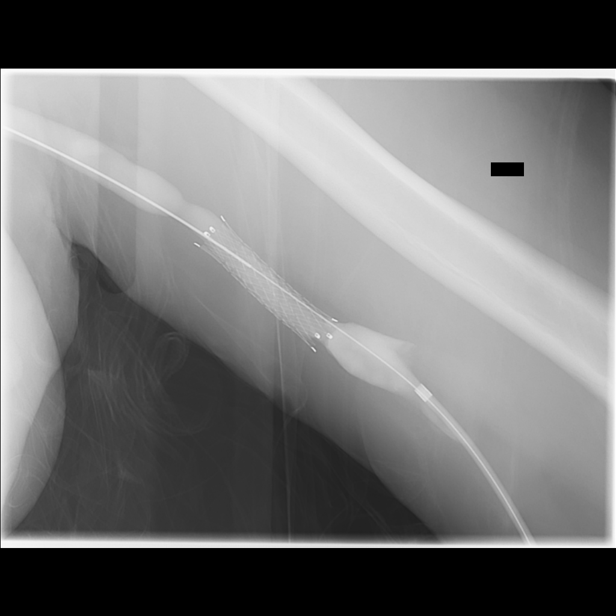

[Series 9: fl - angio · 1 of 1 slices shown (4 of 5)]
[im 1/1]
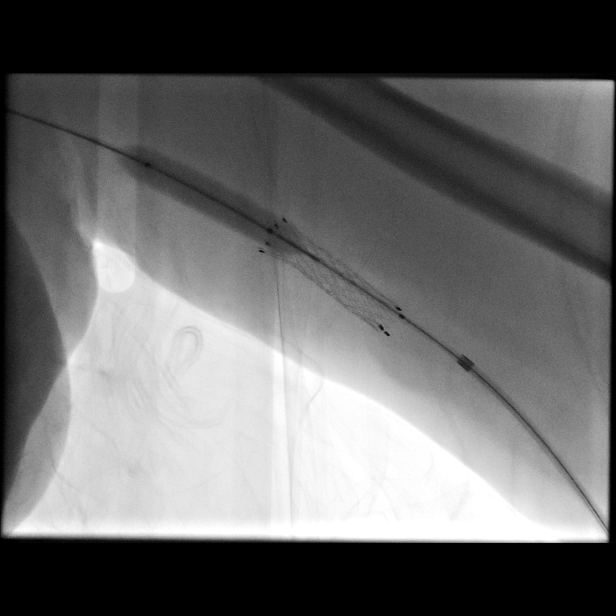

[Series 10: care aorta · 1 of 2 slices shown (6 of 7)]
[im 2/2]
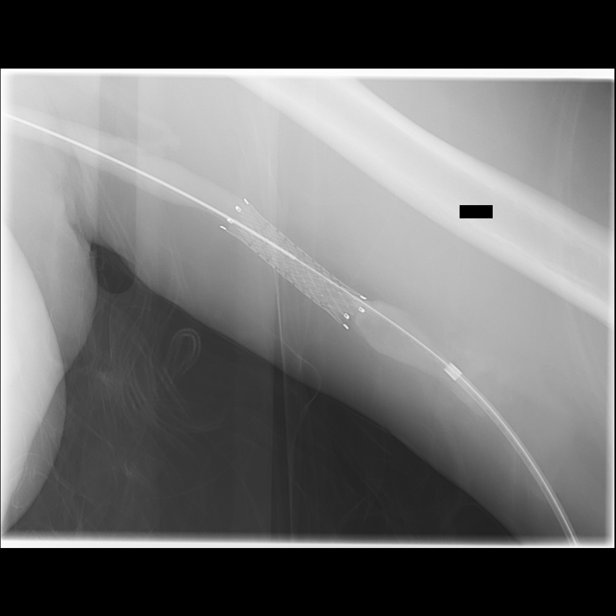

[Series 11: fl - angio · 1 of 1 slices shown (5 of 5)]
[im 1/1]
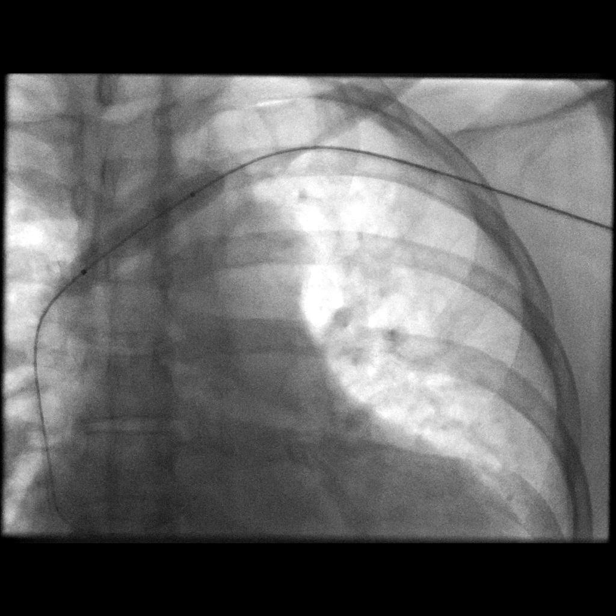

[Series 12: care aorta · 2 of 2 slices shown (7 of 7)]
[im 1/2]
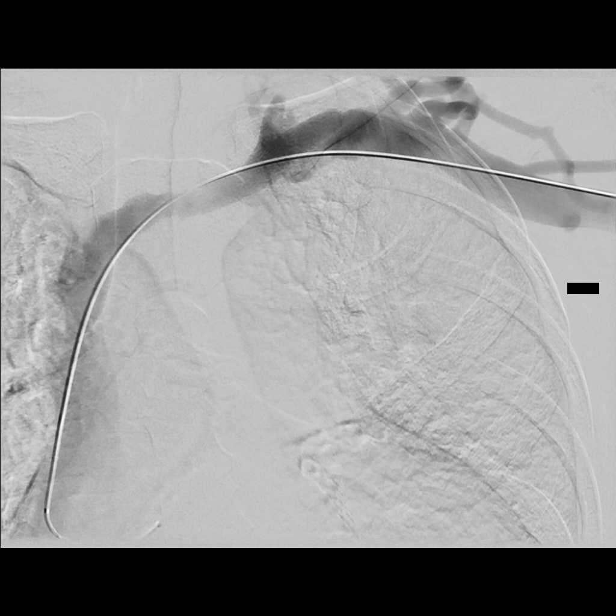
[im 2/2]
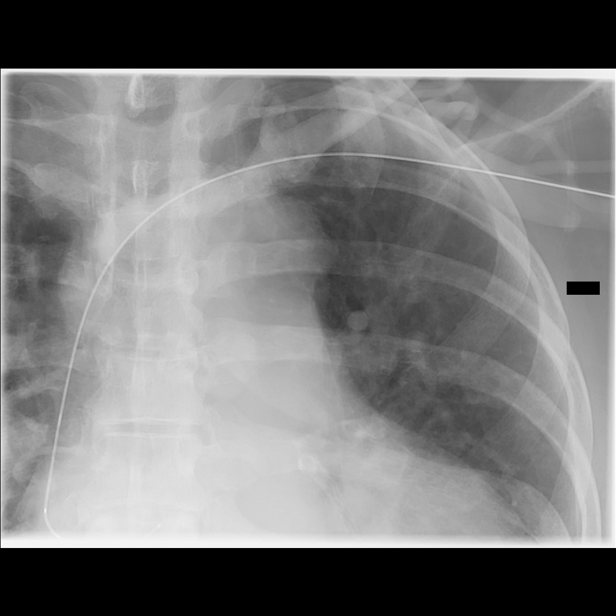

[15 of 17 positions shown; findings below may reference images not displayed]

IMAGES IMPORTED FROM THE [employer] WORKFLOW SYSTEM
NO DICTATION FOR STUDY

## 2013-12-29 ENCOUNTER — Inpatient Hospital Stay: Payer: Self-pay | Admitting: Internal Medicine

## 2013-12-29 LAB — CBC
HCT: 35.6 % (ref 35.0–47.0)
HGB: 11.3 g/dL — ABNORMAL LOW (ref 12.0–16.0)
MCH: 31.3 pg (ref 26.0–34.0)
MCHC: 31.8 g/dL — ABNORMAL LOW (ref 32.0–36.0)
MCV: 98 fL (ref 80–100)
PLATELETS: 132 10*3/uL — AB (ref 150–440)
RBC: 3.62 10*6/uL — ABNORMAL LOW (ref 3.80–5.20)
RDW: 15.3 % — ABNORMAL HIGH (ref 11.5–14.5)
WBC: 5.4 10*3/uL (ref 3.6–11.0)

## 2013-12-29 LAB — BASIC METABOLIC PANEL
Anion Gap: 10 (ref 7–16)
BUN: 35 mg/dL — AB (ref 7–18)
CHLORIDE: 103 mmol/L (ref 98–107)
Calcium, Total: 8.1 mg/dL — ABNORMAL LOW (ref 8.5–10.1)
Co2: 27 mmol/L (ref 21–32)
Creatinine: 5.59 mg/dL — ABNORMAL HIGH (ref 0.60–1.30)
EGFR (Non-African Amer.): 8 — ABNORMAL LOW
GFR CALC AF AMER: 9 — AB
Glucose: 101 mg/dL — ABNORMAL HIGH (ref 65–99)
OSMOLALITY: 288 (ref 275–301)
POTASSIUM: 4.1 mmol/L (ref 3.5–5.1)
Sodium: 140 mmol/L (ref 136–145)

## 2013-12-29 LAB — CK-MB: CK-MB: 1.9 ng/mL (ref 0.5–3.6)

## 2013-12-29 LAB — TROPONIN I
TROPONIN-I: 0.35 ng/mL — AB
Troponin-I: 0.39 ng/mL — ABNORMAL HIGH
Troponin-I: 0.41 ng/mL — ABNORMAL HIGH
Troponin-I: 0.38 ng/mL — ABNORMAL HIGH

## 2013-12-30 LAB — PHOSPHORUS: Phosphorus: 4.5 mg/dL (ref 2.5–4.9)

## 2014-01-03 IMAGING — XA IR VASCULAR PROCEDURE
6 series · 10 of 10 positions shown · IV contrast (IODINE)
Comparison: none

[Series 1: care upper arm · 2 of 2 slices shown (1 of 4)]
[im 1/2]
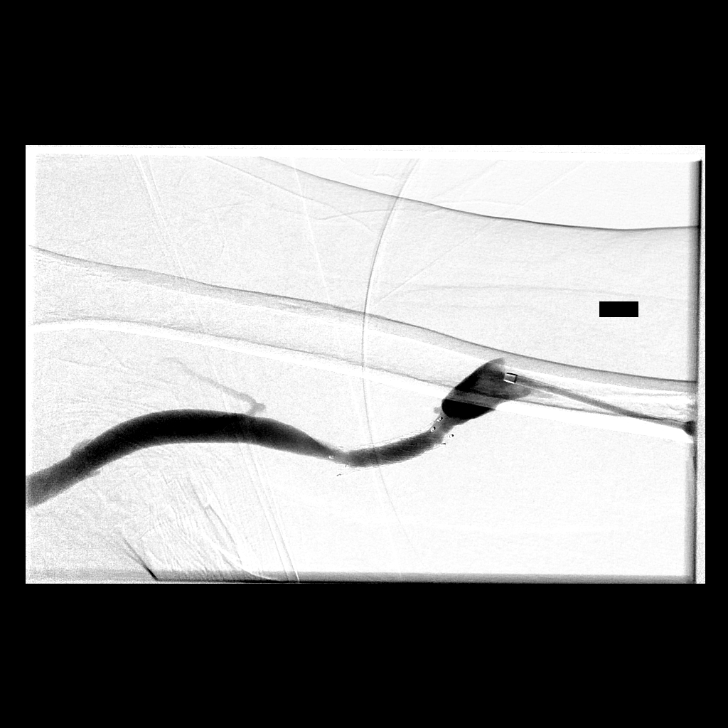
[im 2/2]
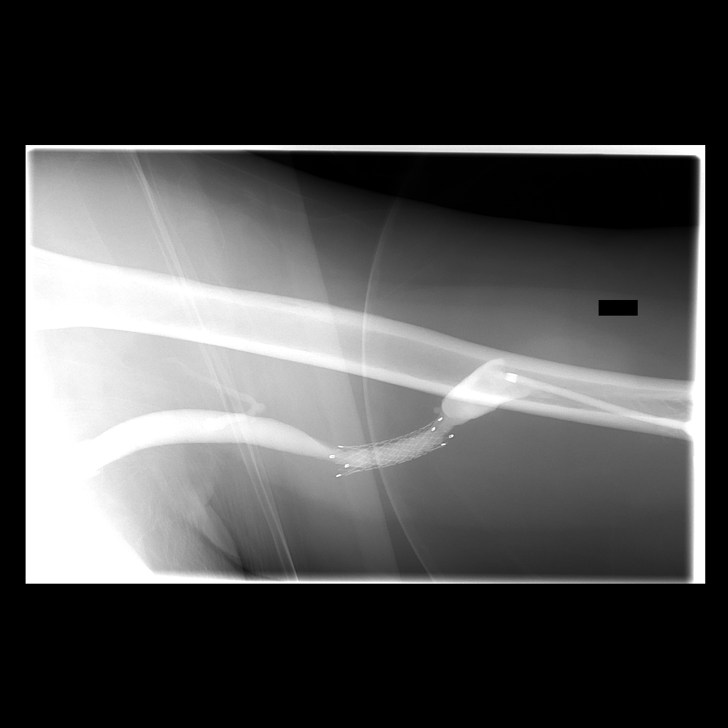

[Series 2: care upper arm · 2 of 2 slices shown (2 of 4)]
[im 1/2]
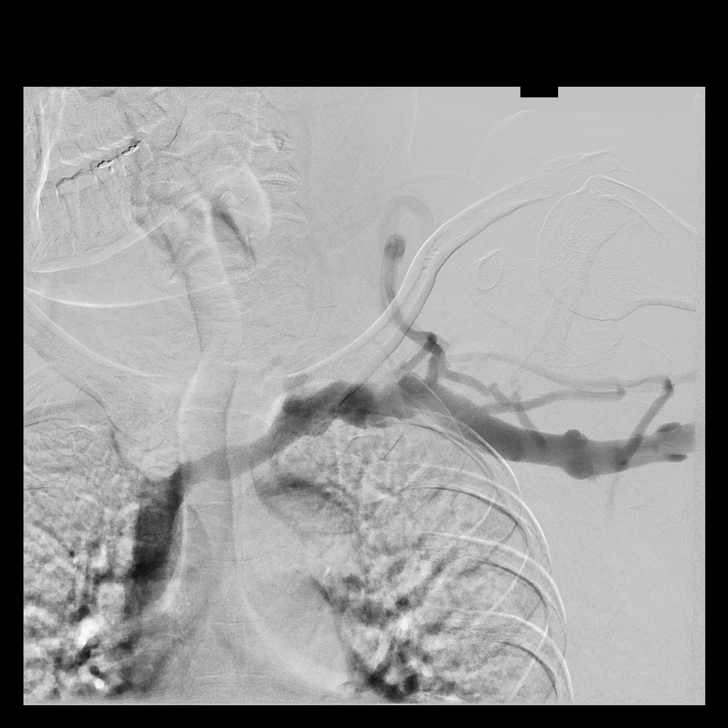
[im 2/2]
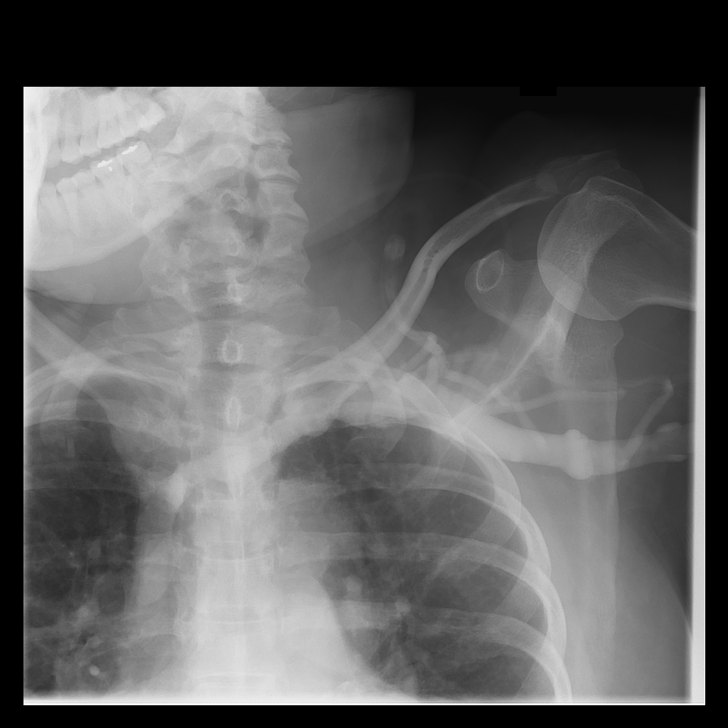

[Series 3: fl - angio · 1 of 1 slices shown (1 of 2)]
[im 1/1]
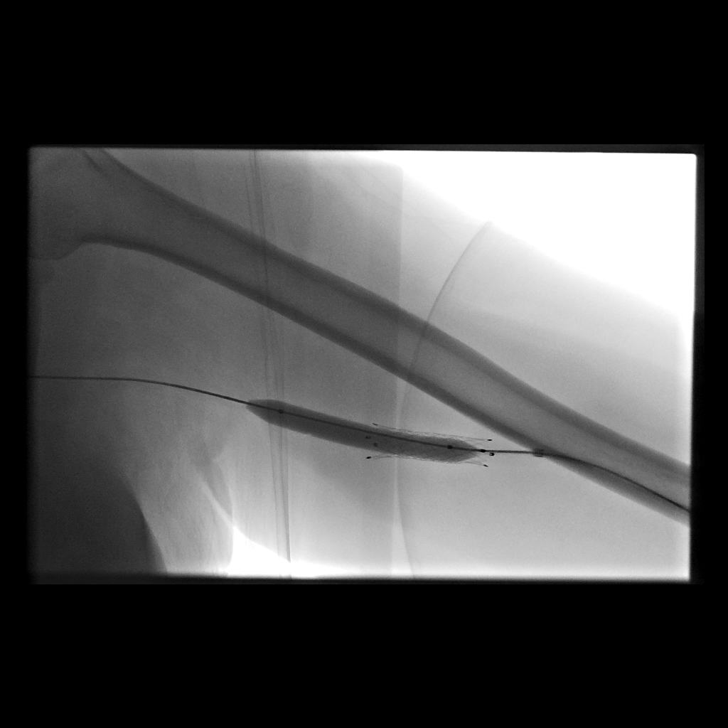

[Series 4: fl - angio · 1 of 1 slices shown (2 of 2)]
[im 1/1]
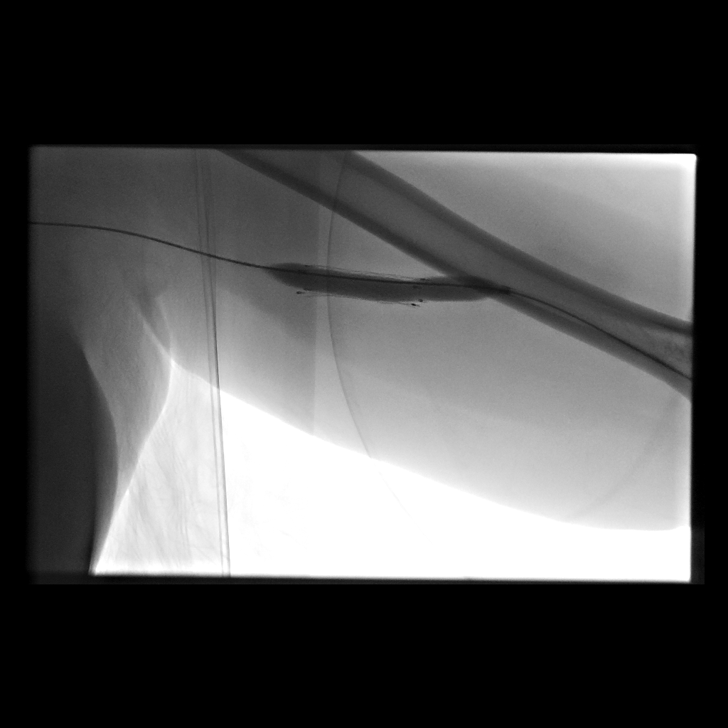

[Series 5: care upper arm · 2 of 2 slices shown (3 of 4)]
[im 1/2]
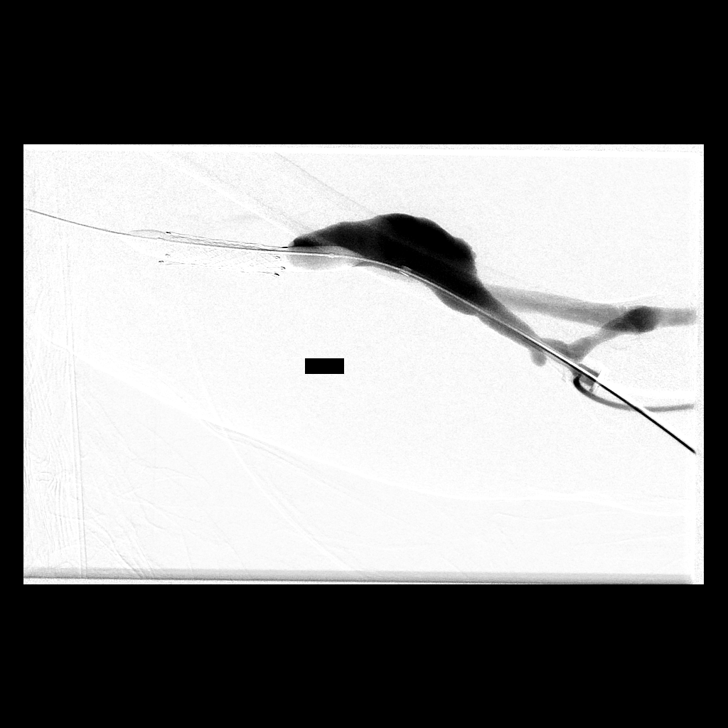
[im 2/2]
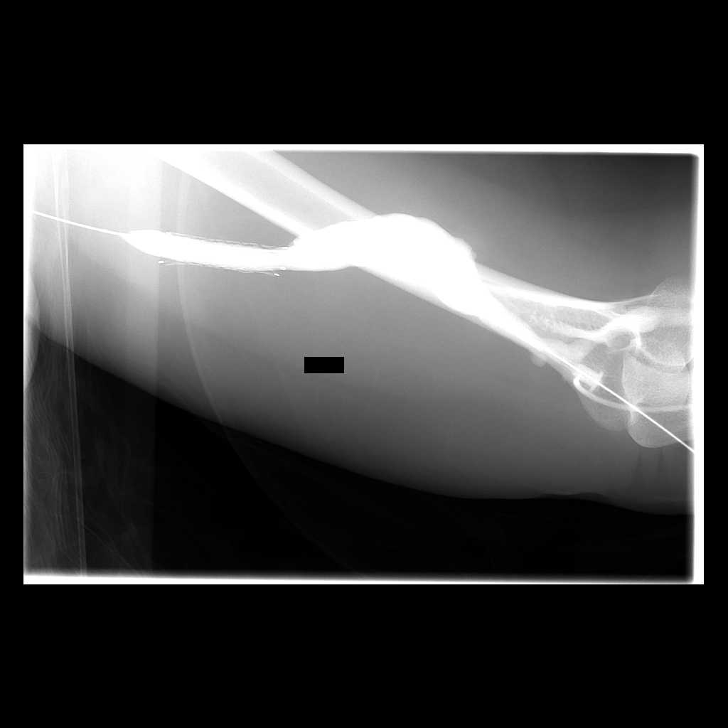

[Series 6: care upper arm · 2 of 2 slices shown (4 of 4)]
[im 1/2]
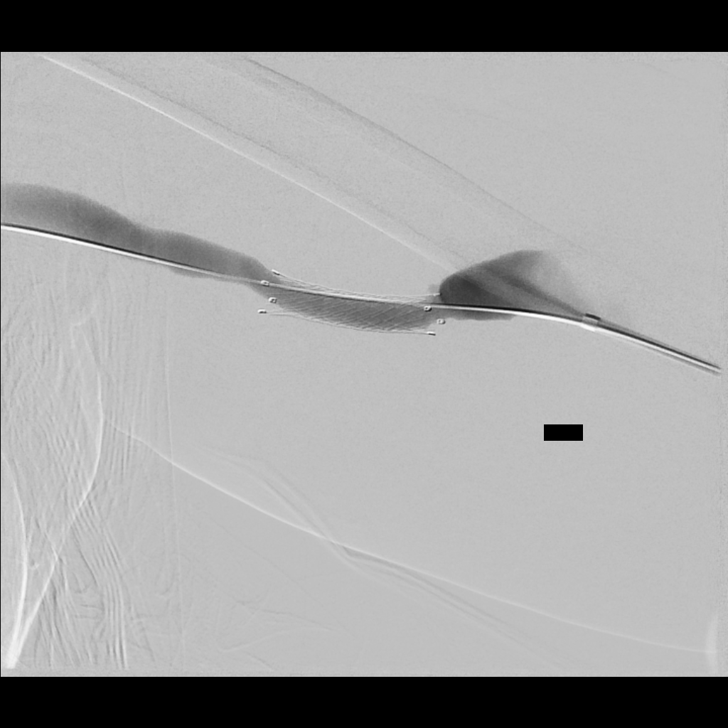
[im 2/2]
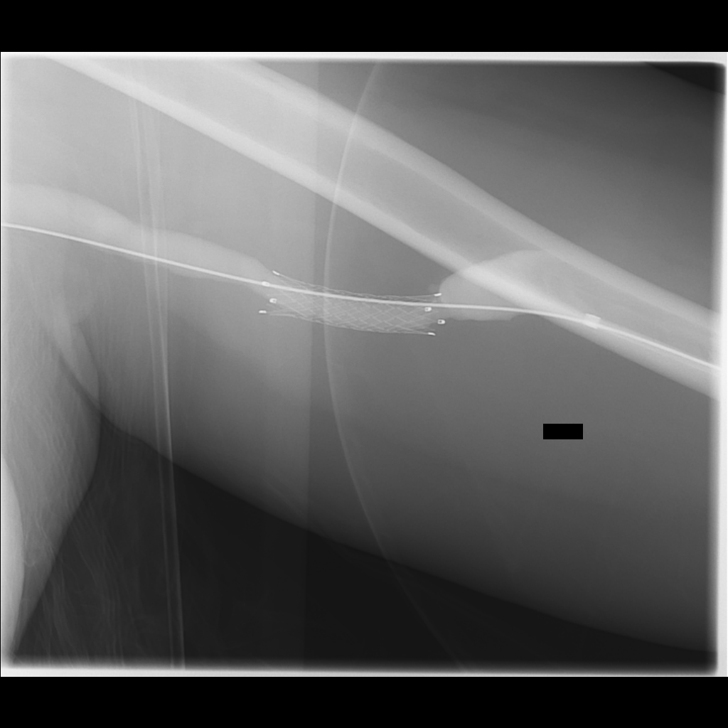

[10 of 10 positions shown; findings below may reference images not displayed]

IMAGES IMPORTED FROM THE [employer] WORKFLOW SYSTEM
NO DICTATION FOR STUDY

## 2014-01-30 ENCOUNTER — Ambulatory Visit: Payer: Self-pay | Admitting: Internal Medicine

## 2014-02-05 ENCOUNTER — Inpatient Hospital Stay: Payer: Self-pay | Admitting: Internal Medicine

## 2014-02-05 LAB — POTASSIUM
POTASSIUM: 3.9 mmol/L (ref 3.5–5.1)
Potassium: 3.8 mmol/L (ref 3.5–5.1)

## 2014-02-05 LAB — COMPREHENSIVE METABOLIC PANEL
ALBUMIN: 3.2 g/dL — AB (ref 3.4–5.0)
ALK PHOS: 151 U/L — AB
ALT: 66 U/L — AB
ANION GAP: 12 (ref 7–16)
BUN: 71 mg/dL — AB (ref 7–18)
Bilirubin,Total: 0.6 mg/dL (ref 0.2–1.0)
CALCIUM: 8.4 mg/dL — AB (ref 8.5–10.1)
Chloride: 102 mmol/L (ref 98–107)
Co2: 21 mmol/L (ref 21–32)
Creatinine: 8.67 mg/dL — ABNORMAL HIGH (ref 0.60–1.30)
GFR CALC AF AMER: 6 — AB
GFR CALC NON AF AMER: 5 — AB
GLUCOSE: 77 mg/dL (ref 65–99)
OSMOLALITY: 290 (ref 275–301)
POTASSIUM: 7 mmol/L — AB (ref 3.5–5.1)
SGOT(AST): 60 U/L — ABNORMAL HIGH (ref 15–37)
SODIUM: 135 mmol/L — AB (ref 136–145)
Total Protein: 7.9 g/dL (ref 6.4–8.2)

## 2014-02-05 LAB — BASIC METABOLIC PANEL
ANION GAP: 12 (ref 7–16)
BUN: 70 mg/dL — ABNORMAL HIGH (ref 7–18)
CALCIUM: 8.6 mg/dL (ref 8.5–10.1)
Chloride: 103 mmol/L (ref 98–107)
Co2: 20 mmol/L — ABNORMAL LOW (ref 21–32)
Creatinine: 8.87 mg/dL — ABNORMAL HIGH (ref 0.60–1.30)
GFR CALC AF AMER: 5 — AB
GFR CALC NON AF AMER: 5 — AB
GLUCOSE: 71 mg/dL (ref 65–99)
Osmolality: 289 (ref 275–301)
POTASSIUM: 7 mmol/L — AB (ref 3.5–5.1)
SODIUM: 135 mmol/L — AB (ref 136–145)

## 2014-02-05 LAB — CK TOTAL AND CKMB (NOT AT ARMC)
CK, Total: 60 U/L
CK-MB: 3.3 ng/mL (ref 0.5–3.6)

## 2014-02-05 LAB — TROPONIN I
Troponin-I: 0.11 ng/mL — ABNORMAL HIGH
Troponin-I: 0.11 ng/mL — ABNORMAL HIGH
Troponin-I: 0.13 ng/mL — ABNORMAL HIGH
Troponin-I: 0.14 ng/mL — ABNORMAL HIGH

## 2014-02-05 LAB — CBC
HCT: 35 % (ref 35.0–47.0)
HGB: 11.4 g/dL — ABNORMAL LOW (ref 12.0–16.0)
MCH: 30.8 pg (ref 26.0–34.0)
MCHC: 32.5 g/dL (ref 32.0–36.0)
MCV: 95 fL (ref 80–100)
PLATELETS: 185 10*3/uL (ref 150–440)
RBC: 3.7 10*6/uL — AB (ref 3.80–5.20)
RDW: 16 % — ABNORMAL HIGH (ref 11.5–14.5)
WBC: 6.8 10*3/uL (ref 3.6–11.0)

## 2014-02-05 LAB — PROTIME-INR
INR: 1.1
Prothrombin Time: 14 secs (ref 11.5–14.7)

## 2014-02-05 LAB — PRO B NATRIURETIC PEPTIDE: B-TYPE NATIURETIC PEPTID: 88303 pg/mL — AB (ref 0–125)

## 2014-02-05 LAB — APTT: Activated PTT: 27.8 secs (ref 23.6–35.9)

## 2014-02-06 LAB — CBC WITH DIFFERENTIAL/PLATELET
BASOS ABS: 0 10*3/uL (ref 0.0–0.1)
Basophil %: 0.3 %
EOS PCT: 0.1 %
Eosinophil #: 0 10*3/uL (ref 0.0–0.7)
HCT: 32.1 % — ABNORMAL LOW (ref 35.0–47.0)
HGB: 10.6 g/dL — ABNORMAL LOW (ref 12.0–16.0)
LYMPHS ABS: 0.7 10*3/uL — AB (ref 1.0–3.6)
Lymphocyte %: 12.5 %
MCH: 31.1 pg (ref 26.0–34.0)
MCHC: 32.9 g/dL (ref 32.0–36.0)
MCV: 95 fL (ref 80–100)
Monocyte #: 0.4 x10 3/mm (ref 0.2–0.9)
Monocyte %: 6.1 %
Neutrophil #: 4.8 10*3/uL (ref 1.4–6.5)
Neutrophil %: 81 %
Platelet: 163 10*3/uL (ref 150–440)
RBC: 3.39 10*6/uL — ABNORMAL LOW (ref 3.80–5.20)
RDW: 15.7 % — ABNORMAL HIGH (ref 11.5–14.5)
WBC: 5.9 10*3/uL (ref 3.6–11.0)

## 2014-02-06 LAB — BASIC METABOLIC PANEL
ANION GAP: 11 (ref 7–16)
BUN: 51 mg/dL — AB (ref 7–18)
Calcium, Total: 7.9 mg/dL — ABNORMAL LOW (ref 8.5–10.1)
Chloride: 99 mmol/L (ref 98–107)
Co2: 28 mmol/L (ref 21–32)
Creatinine: 5.7 mg/dL — ABNORMAL HIGH (ref 0.60–1.30)
EGFR (African American): 9 — ABNORMAL LOW
EGFR (Non-African Amer.): 8 — ABNORMAL LOW
Glucose: 147 mg/dL — ABNORMAL HIGH (ref 65–99)
Osmolality: 292 (ref 275–301)
Potassium: 5.9 mmol/L — ABNORMAL HIGH (ref 3.5–5.1)
Sodium: 138 mmol/L (ref 136–145)

## 2014-02-06 LAB — POTASSIUM: POTASSIUM: 5.4 mmol/L — AB (ref 3.5–5.1)

## 2014-02-09 ENCOUNTER — Inpatient Hospital Stay: Payer: Self-pay | Admitting: Internal Medicine

## 2014-02-09 LAB — CBC
HCT: 29.9 % — ABNORMAL LOW (ref 35.0–47.0)
HGB: 9.5 g/dL — AB (ref 12.0–16.0)
MCH: 30.4 pg (ref 26.0–34.0)
MCHC: 31.8 g/dL — AB (ref 32.0–36.0)
MCV: 96 fL (ref 80–100)
PLATELETS: 154 10*3/uL (ref 150–440)
RBC: 3.12 10*6/uL — ABNORMAL LOW (ref 3.80–5.20)
RDW: 15.5 % — AB (ref 11.5–14.5)
WBC: 6.6 10*3/uL (ref 3.6–11.0)

## 2014-02-09 LAB — CK-MB
CK-MB: 3.5 ng/mL (ref 0.5–3.6)
CK-MB: 3.7 ng/mL — ABNORMAL HIGH (ref 0.5–3.6)
CK-MB: 3.3 ng/mL (ref 0.5–3.6)

## 2014-02-09 LAB — TROPONIN I
TROPONIN-I: 2.2 ng/mL — AB
TROPONIN-I: 2.5 ng/mL — AB
Troponin-I: 2.3 ng/mL — ABNORMAL HIGH

## 2014-02-09 LAB — BASIC METABOLIC PANEL
ANION GAP: 12 (ref 7–16)
BUN: 45 mg/dL — ABNORMAL HIGH (ref 7–18)
Calcium, Total: 7.8 mg/dL — ABNORMAL LOW (ref 8.5–10.1)
Chloride: 100 mmol/L (ref 98–107)
Co2: 23 mmol/L (ref 21–32)
Creatinine: 5.4 mg/dL — ABNORMAL HIGH (ref 0.60–1.30)
EGFR (African American): 10 — ABNORMAL LOW
GFR CALC NON AF AMER: 8 — AB
Glucose: 131 mg/dL — ABNORMAL HIGH (ref 65–99)
OSMOLALITY: 283 (ref 275–301)
Potassium: 4.9 mmol/L (ref 3.5–5.1)
SODIUM: 135 mmol/L — AB (ref 136–145)

## 2014-02-09 LAB — PROTIME-INR
INR: 1.1
PROTHROMBIN TIME: 14.3 s (ref 11.5–14.7)

## 2014-02-09 LAB — HEPARIN LEVEL (UNFRACTIONATED): ANTI-XA(UNFRACTIONATED): 0.39 [IU]/mL (ref 0.30–0.70)

## 2014-02-09 LAB — APTT: ACTIVATED PTT: 26.6 s (ref 23.6–35.9)

## 2014-02-10 LAB — BASIC METABOLIC PANEL
Anion Gap: 17 — ABNORMAL HIGH (ref 7–16)
BUN: 71 mg/dL — ABNORMAL HIGH (ref 7–18)
Calcium, Total: 7.2 mg/dL — ABNORMAL LOW (ref 8.5–10.1)
Chloride: 99 mmol/L (ref 98–107)
Co2: 23 mmol/L (ref 21–32)
Creatinine: 8.42 mg/dL — ABNORMAL HIGH (ref 0.60–1.30)
EGFR (Non-African Amer.): 5 — ABNORMAL LOW
GFR CALC AF AMER: 6 — AB
Glucose: 115 mg/dL — ABNORMAL HIGH (ref 65–99)
Osmolality: 299 (ref 275–301)
Potassium: 4.3 mmol/L (ref 3.5–5.1)
Sodium: 139 mmol/L (ref 136–145)

## 2014-02-10 LAB — CBC WITH DIFFERENTIAL/PLATELET
Basophil #: 0 10*3/uL (ref 0.0–0.1)
Basophil %: 0.7 %
EOS ABS: 0.2 10*3/uL (ref 0.0–0.7)
Eosinophil %: 2.7 %
HCT: 29.2 % — ABNORMAL LOW (ref 35.0–47.0)
HGB: 9.2 g/dL — AB (ref 12.0–16.0)
Lymphocyte #: 1.5 10*3/uL (ref 1.0–3.6)
Lymphocyte %: 23.3 %
MCH: 30.1 pg (ref 26.0–34.0)
MCHC: 31.5 g/dL — AB (ref 32.0–36.0)
MCV: 96 fL (ref 80–100)
MONO ABS: 0.6 x10 3/mm (ref 0.2–0.9)
MONOS PCT: 8.4 %
NEUTROS ABS: 4.3 10*3/uL (ref 1.4–6.5)
Neutrophil %: 64.9 %
PLATELETS: 167 10*3/uL (ref 150–440)
RBC: 3.06 10*6/uL — AB (ref 3.80–5.20)
RDW: 16 % — ABNORMAL HIGH (ref 11.5–14.5)
WBC: 6.6 10*3/uL (ref 3.6–11.0)

## 2014-02-10 LAB — LIPID PANEL
Cholesterol: 127 mg/dL (ref 0–200)
HDL: 53 mg/dL (ref 40–60)
Ldl Cholesterol, Calc: 53 mg/dL (ref 0–100)
TRIGLYCERIDES: 105 mg/dL (ref 0–200)
VLDL Cholesterol, Calc: 21 mg/dL (ref 5–40)

## 2014-02-10 LAB — PHOSPHORUS: PHOSPHORUS: 4.1 mg/dL (ref 2.5–4.9)

## 2014-02-10 LAB — MAGNESIUM: Magnesium: 2.3 mg/dL

## 2014-02-10 LAB — HEPARIN LEVEL (UNFRACTIONATED): ANTI-XA(UNFRACTIONATED): 0.26 [IU]/mL — AB (ref 0.30–0.70)

## 2014-02-11 ENCOUNTER — Emergency Department: Payer: Self-pay | Admitting: Emergency Medicine

## 2014-02-11 LAB — BASIC METABOLIC PANEL
Anion Gap: 8 (ref 7–16)
BUN: 39 mg/dL — ABNORMAL HIGH (ref 7–18)
CO2: 30 mmol/L (ref 21–32)
CREATININE: 5.26 mg/dL — AB (ref 0.60–1.30)
Calcium, Total: 7.5 mg/dL — ABNORMAL LOW (ref 8.5–10.1)
Chloride: 101 mmol/L (ref 98–107)
EGFR (African American): 10 — ABNORMAL LOW
GFR CALC NON AF AMER: 9 — AB
GLUCOSE: 82 mg/dL (ref 65–99)
Osmolality: 286 (ref 275–301)
POTASSIUM: 4.5 mmol/L (ref 3.5–5.1)
Sodium: 139 mmol/L (ref 136–145)

## 2014-02-11 LAB — CBC
HCT: 29.6 % — ABNORMAL LOW (ref 35.0–47.0)
HGB: 9.6 g/dL — AB (ref 12.0–16.0)
MCH: 30.5 pg (ref 26.0–34.0)
MCHC: 32.4 g/dL (ref 32.0–36.0)
MCV: 94 fL (ref 80–100)
Platelet: 168 10*3/uL (ref 150–440)
RBC: 3.15 10*6/uL — ABNORMAL LOW (ref 3.80–5.20)
RDW: 16 % — AB (ref 11.5–14.5)
WBC: 7.8 10*3/uL (ref 3.6–11.0)

## 2014-02-11 LAB — CK-MB
CK-MB: 2.3 ng/mL (ref 0.5–3.6)
CK-MB: 2.7 ng/mL (ref 0.5–3.6)

## 2014-02-11 LAB — PROTIME-INR
INR: 1.1
PROTHROMBIN TIME: 13.8 s (ref 11.5–14.7)

## 2014-02-11 LAB — TROPONIN I
TROPONIN-I: 1 ng/mL — AB
Troponin-I: 0.95 ng/mL — ABNORMAL HIGH

## 2014-02-11 LAB — PRO B NATRIURETIC PEPTIDE: B-TYPE NATIURETIC PEPTID: 122073 pg/mL — AB (ref 0–125)

## 2014-02-13 DIAGNOSIS — N186 End stage renal disease: Secondary | ICD-10-CM | POA: Insufficient documentation

## 2014-02-13 DIAGNOSIS — Z8673 Personal history of transient ischemic attack (TIA), and cerebral infarction without residual deficits: Secondary | ICD-10-CM | POA: Insufficient documentation

## 2014-02-13 DIAGNOSIS — I5020 Unspecified systolic (congestive) heart failure: Secondary | ICD-10-CM | POA: Insufficient documentation

## 2014-02-13 DIAGNOSIS — E785 Hyperlipidemia, unspecified: Secondary | ICD-10-CM | POA: Insufficient documentation

## 2014-02-13 DIAGNOSIS — I2510 Atherosclerotic heart disease of native coronary artery without angina pectoris: Secondary | ICD-10-CM | POA: Insufficient documentation

## 2014-02-13 DIAGNOSIS — I120 Hypertensive chronic kidney disease with stage 5 chronic kidney disease or end stage renal disease: Secondary | ICD-10-CM | POA: Insufficient documentation

## 2014-02-13 DIAGNOSIS — Z8619 Personal history of other infectious and parasitic diseases: Secondary | ICD-10-CM | POA: Insufficient documentation

## 2014-02-13 DIAGNOSIS — Z992 Dependence on renal dialysis: Secondary | ICD-10-CM | POA: Insufficient documentation

## 2014-02-13 HISTORY — DX: Personal history of transient ischemic attack (TIA), and cerebral infarction without residual deficits: Z86.73

## 2014-02-20 DIAGNOSIS — Z951 Presence of aortocoronary bypass graft: Secondary | ICD-10-CM

## 2014-02-20 HISTORY — PX: CORONARY ARTERY BYPASS GRAFT: SHX141

## 2014-02-20 HISTORY — DX: Presence of aortocoronary bypass graft: Z95.1

## 2014-02-20 HISTORY — PX: IABP INSERTION: CATH118242

## 2014-02-21 DIAGNOSIS — Z992 Dependence on renal dialysis: Secondary | ICD-10-CM | POA: Insufficient documentation

## 2014-02-21 DIAGNOSIS — Z8673 Personal history of transient ischemic attack (TIA), and cerebral infarction without residual deficits: Secondary | ICD-10-CM

## 2014-02-21 DIAGNOSIS — I639 Cerebral infarction, unspecified: Secondary | ICD-10-CM | POA: Insufficient documentation

## 2014-02-21 DIAGNOSIS — I97820 Postprocedural cerebrovascular infarction during cardiac surgery: Secondary | ICD-10-CM

## 2014-02-21 DIAGNOSIS — Z951 Presence of aortocoronary bypass graft: Secondary | ICD-10-CM | POA: Insufficient documentation

## 2014-02-21 HISTORY — DX: Personal history of transient ischemic attack (TIA), and cerebral infarction without residual deficits: Z86.73

## 2014-02-21 HISTORY — DX: Postprocedural cerebrovascular infarction following cardiac surgery: I97.820

## 2014-02-24 DIAGNOSIS — I959 Hypotension, unspecified: Secondary | ICD-10-CM | POA: Insufficient documentation

## 2014-02-24 DIAGNOSIS — Z452 Encounter for adjustment and management of vascular access device: Secondary | ICD-10-CM | POA: Insufficient documentation

## 2014-02-24 DIAGNOSIS — D638 Anemia in other chronic diseases classified elsewhere: Secondary | ICD-10-CM | POA: Insufficient documentation

## 2014-02-24 HISTORY — DX: Hypotension, unspecified: I95.9

## 2014-02-26 DIAGNOSIS — D72829 Elevated white blood cell count, unspecified: Secondary | ICD-10-CM

## 2014-02-26 HISTORY — DX: Elevated white blood cell count, unspecified: D72.829

## 2014-02-27 DIAGNOSIS — D631 Anemia in chronic kidney disease: Secondary | ICD-10-CM | POA: Insufficient documentation

## 2014-02-27 DIAGNOSIS — F1911 Other psychoactive substance abuse, in remission: Secondary | ICD-10-CM | POA: Insufficient documentation

## 2014-02-27 DIAGNOSIS — N189 Chronic kidney disease, unspecified: Secondary | ICD-10-CM | POA: Insufficient documentation

## 2014-02-27 DIAGNOSIS — E669 Obesity, unspecified: Secondary | ICD-10-CM | POA: Insufficient documentation

## 2014-02-27 DIAGNOSIS — G8918 Other acute postprocedural pain: Secondary | ICD-10-CM | POA: Insufficient documentation

## 2014-02-27 DIAGNOSIS — D62 Acute posthemorrhagic anemia: Secondary | ICD-10-CM | POA: Insufficient documentation

## 2014-02-27 HISTORY — DX: Acute posthemorrhagic anemia: D62

## 2014-02-27 HISTORY — DX: Other psychoactive substance abuse, in remission: F19.11

## 2014-03-03 DIAGNOSIS — I6381 Other cerebral infarction due to occlusion or stenosis of small artery: Secondary | ICD-10-CM

## 2014-03-03 HISTORY — DX: Other cerebral infarction due to occlusion or stenosis of small artery: I63.81

## 2014-03-14 DIAGNOSIS — T8141XA Infection following a procedure, superficial incisional surgical site, initial encounter: Secondary | ICD-10-CM

## 2014-03-14 HISTORY — DX: Infection following a procedure, superficial incisional surgical site, initial encounter: T81.41XA

## 2014-03-22 DIAGNOSIS — I4892 Unspecified atrial flutter: Secondary | ICD-10-CM

## 2014-03-22 HISTORY — DX: Unspecified atrial flutter: I48.92

## 2014-03-25 DIAGNOSIS — F0150 Vascular dementia without behavioral disturbance: Secondary | ICD-10-CM | POA: Insufficient documentation

## 2014-03-25 DIAGNOSIS — F02A0 Dementia in other diseases classified elsewhere, mild, without behavioral disturbance, psychotic disturbance, mood disturbance, and anxiety: Secondary | ICD-10-CM | POA: Insufficient documentation

## 2014-03-26 DIAGNOSIS — E872 Acidosis, unspecified: Secondary | ICD-10-CM | POA: Insufficient documentation

## 2014-04-02 DIAGNOSIS — Z951 Presence of aortocoronary bypass graft: Secondary | ICD-10-CM | POA: Insufficient documentation

## 2014-04-10 ENCOUNTER — Emergency Department: Payer: Self-pay | Admitting: Emergency Medicine

## 2014-04-10 ENCOUNTER — Inpatient Hospital Stay: Payer: Self-pay | Admitting: Internal Medicine

## 2014-04-10 LAB — COMPREHENSIVE METABOLIC PANEL
ALBUMIN: 2.9 g/dL — AB (ref 3.4–5.0)
ALK PHOS: 100 U/L
ANION GAP: 17 — AB (ref 7–16)
AST: 43 U/L — AB (ref 15–37)
AST: 41 U/L — AB (ref 15–37)
Albumin: 2.4 g/dL — ABNORMAL LOW (ref 3.4–5.0)
Alkaline Phosphatase: 78 U/L
Anion Gap: 15 (ref 7–16)
BUN: 63 mg/dL — ABNORMAL HIGH (ref 7–18)
BUN: 66 mg/dL — AB (ref 7–18)
Bilirubin,Total: 0.6 mg/dL (ref 0.2–1.0)
Bilirubin,Total: 0.6 mg/dL (ref 0.2–1.0)
CALCIUM: 7.8 mg/dL — AB (ref 8.5–10.1)
CALCIUM: 7.1 mg/dL — AB (ref 8.5–10.1)
CHLORIDE: 99 mmol/L (ref 98–107)
CHLORIDE: 102 mmol/L (ref 98–107)
CO2: 16 mmol/L — AB (ref 21–32)
Co2: 21 mmol/L (ref 21–32)
Creatinine: 9.06 mg/dL — ABNORMAL HIGH (ref 0.60–1.30)
Creatinine: 8.75 mg/dL — ABNORMAL HIGH (ref 0.60–1.30)
EGFR (African American): 6 — ABNORMAL LOW
EGFR (African American): 6 — ABNORMAL LOW
EGFR (Non-African Amer.): 5 — ABNORMAL LOW
EGFR (Non-African Amer.): 5 — ABNORMAL LOW
GLUCOSE: 80 mg/dL (ref 65–99)
Glucose: 93 mg/dL (ref 65–99)
OSMOLALITY: 288 (ref 275–301)
Osmolality: 288 (ref 275–301)
Potassium: 4.6 mmol/L (ref 3.5–5.1)
Potassium: 5.3 mmol/L — ABNORMAL HIGH (ref 3.5–5.1)
SGPT (ALT): 21 U/L
SGPT (ALT): 19 U/L
SODIUM: 135 mmol/L — AB (ref 136–145)
Sodium: 135 mmol/L — ABNORMAL LOW (ref 136–145)
TOTAL PROTEIN: 8.3 g/dL — AB (ref 6.4–8.2)
TOTAL PROTEIN: 7.1 g/dL (ref 6.4–8.2)

## 2014-04-10 LAB — ETHANOL: Ethanol: <3 mg/dL

## 2014-04-10 LAB — CBC WITH DIFFERENTIAL/PLATELET
Basophil #: 0.1 10*3/uL (ref 0.0–0.1)
Basophil %: 1 %
Eosinophil #: 0 10*3/uL (ref 0.0–0.7)
Eosinophil %: 0.4 %
HCT: 36 % (ref 35.0–47.0)
HGB: 10.4 g/dL — AB (ref 12.0–16.0)
LYMPHS PCT: 25.7 %
Lymphocyte #: 1.8 10*3/uL (ref 1.0–3.6)
MCH: 29.1 pg (ref 26.0–34.0)
MCHC: 28.8 g/dL — AB (ref 32.0–36.0)
MCV: 101 fL — AB (ref 80–100)
Monocyte #: 0.7 x10 3/mm (ref 0.2–0.9)
Monocyte %: 10.5 %
NEUTROS ABS: 4.4 10*3/uL (ref 1.4–6.5)
Neutrophil %: 62.4 %
PLATELETS: 145 10*3/uL — AB (ref 150–440)
RBC: 3.57 10*6/uL — AB (ref 3.80–5.20)
RDW: 26.5 % — AB (ref 11.5–14.5)
WBC: 7.1 10*3/uL (ref 3.6–11.0)

## 2014-04-10 LAB — CBC
HCT: 37.2 % (ref 35.0–47.0)
HGB: 11.1 g/dL — ABNORMAL LOW (ref 12.0–16.0)
MCH: 29.6 pg (ref 26.0–34.0)
MCHC: 29.8 g/dL — AB (ref 32.0–36.0)
MCV: 99 fL (ref 80–100)
PLATELETS: 151 10*3/uL (ref 150–440)
RBC: 3.75 10*6/uL — ABNORMAL LOW (ref 3.80–5.20)
RDW: 26.7 % — ABNORMAL HIGH (ref 11.5–14.5)
WBC: 8.1 10*3/uL (ref 3.6–11.0)

## 2014-04-10 LAB — TROPONIN I
TROPONIN-I: 0.08 ng/mL — AB
Troponin-I: 0.05 ng/mL

## 2014-04-10 LAB — MAGNESIUM: Magnesium: 1.9 mg/dL

## 2014-04-10 LAB — CK TOTAL AND CKMB (NOT AT ARMC)
CK, Total: 59 U/L
CK-MB: 1.5 ng/mL (ref 0.5–3.6)

## 2014-04-10 LAB — PROTIME-INR
INR: 1.6
Prothrombin Time: 18.5 secs — ABNORMAL HIGH (ref 11.5–14.7)

## 2014-04-10 LAB — LIPASE, BLOOD: LIPASE: 929 U/L — AB (ref 73–393)

## 2014-04-11 LAB — CBC WITH DIFFERENTIAL/PLATELET
Basophil #: 0 10*3/uL (ref 0.0–0.1)
Basophil %: 0.5 %
Eosinophil #: 0 10*3/uL (ref 0.0–0.7)
Eosinophil %: 0.3 %
HCT: 33 % — ABNORMAL LOW (ref 35.0–47.0)
HGB: 10.1 g/dL — ABNORMAL LOW (ref 12.0–16.0)
LYMPHS PCT: 27 %
Lymphocyte #: 1.8 10*3/uL (ref 1.0–3.6)
MCH: 29.7 pg (ref 26.0–34.0)
MCHC: 30.5 g/dL — AB (ref 32.0–36.0)
MCV: 97 fL (ref 80–100)
MONO ABS: 0.8 x10 3/mm (ref 0.2–0.9)
Monocyte %: 12.8 %
Neutrophil #: 3.8 10*3/uL (ref 1.4–6.5)
Neutrophil %: 59.4 %
PLATELETS: 159 10*3/uL (ref 150–440)
RBC: 3.39 10*6/uL — ABNORMAL LOW (ref 3.80–5.20)
RDW: 25.9 % — ABNORMAL HIGH (ref 11.5–14.5)
WBC: 6.5 10*3/uL (ref 3.6–11.0)

## 2014-04-11 LAB — BASIC METABOLIC PANEL
ANION GAP: 16 (ref 7–16)
Anion Gap: 17 — ABNORMAL HIGH (ref 7–16)
BUN: 68 mg/dL — AB (ref 7–18)
BUN: 72 mg/dL — AB (ref 7–18)
CALCIUM: 7.6 mg/dL — AB (ref 8.5–10.1)
CALCIUM: 7.2 mg/dL — AB (ref 8.5–10.1)
CHLORIDE: 102 mmol/L (ref 98–107)
CO2: 14 mmol/L — AB (ref 21–32)
CO2: 17 mmol/L — AB (ref 21–32)
CREATININE: 9.21 mg/dL — AB (ref 0.60–1.30)
Chloride: 101 mmol/L (ref 98–107)
Creatinine: 9.42 mg/dL — ABNORMAL HIGH (ref 0.60–1.30)
EGFR (African American): 6 — ABNORMAL LOW
EGFR (African American): 6 — ABNORMAL LOW
EGFR (Non-African Amer.): 5 — ABNORMAL LOW
GFR CALC NON AF AMER: 5 — AB
Glucose: 66 mg/dL (ref 65–99)
Glucose: 94 mg/dL (ref 65–99)
OSMOLALITY: 282 (ref 275–301)
Osmolality: 291 (ref 275–301)
Potassium: 5.8 mmol/L — ABNORMAL HIGH (ref 3.5–5.1)
Potassium: 4.8 mmol/L (ref 3.5–5.1)
SODIUM: 132 mmol/L — AB (ref 136–145)
SODIUM: 135 mmol/L — AB (ref 136–145)

## 2014-04-11 LAB — PHOSPHORUS: PHOSPHORUS: 3.6 mg/dL (ref 2.5–4.9)

## 2014-04-11 LAB — TROPONIN I: Troponin-I: 0.06 ng/mL — ABNORMAL HIGH

## 2014-04-11 LAB — PROTIME-INR
INR: 1.9
Prothrombin Time: 21.2 secs — ABNORMAL HIGH (ref 11.5–14.7)

## 2014-04-11 LAB — LIPASE, BLOOD: Lipase: 886 U/L — ABNORMAL HIGH (ref 73–393)

## 2014-04-12 DIAGNOSIS — Z8679 Personal history of other diseases of the circulatory system: Secondary | ICD-10-CM

## 2014-04-12 DIAGNOSIS — Z87898 Personal history of other specified conditions: Secondary | ICD-10-CM

## 2014-04-12 DIAGNOSIS — I3139 Other pericardial effusion (noninflammatory): Secondary | ICD-10-CM | POA: Insufficient documentation

## 2014-04-12 DIAGNOSIS — T8189XA Other complications of procedures, not elsewhere classified, initial encounter: Secondary | ICD-10-CM | POA: Insufficient documentation

## 2014-04-12 DIAGNOSIS — R0789 Other chest pain: Secondary | ICD-10-CM

## 2014-04-12 DIAGNOSIS — I313 Pericardial effusion (noninflammatory): Secondary | ICD-10-CM | POA: Insufficient documentation

## 2014-04-12 DIAGNOSIS — T8132XA Disruption of internal operation (surgical) wound, not elsewhere classified, initial encounter: Secondary | ICD-10-CM | POA: Insufficient documentation

## 2014-04-12 DIAGNOSIS — T81328A Disruption or dehiscence of closure of other specified internal operation (surgical) wound, initial encounter: Secondary | ICD-10-CM | POA: Insufficient documentation

## 2014-04-12 DIAGNOSIS — I970 Postcardiotomy syndrome: Secondary | ICD-10-CM | POA: Insufficient documentation

## 2014-04-12 DIAGNOSIS — G8918 Other acute postprocedural pain: Secondary | ICD-10-CM | POA: Insufficient documentation

## 2014-04-12 HISTORY — DX: Personal history of other diseases of the circulatory system: Z86.79

## 2014-04-12 HISTORY — DX: Personal history of other specified conditions: Z87.898

## 2014-04-12 HISTORY — DX: Other complications of procedures, not elsewhere classified, initial encounter: T81.89XA

## 2014-04-16 ENCOUNTER — Encounter: Payer: Self-pay | Admitting: General Surgery

## 2014-04-18 LAB — CLOSTRIDIUM DIFFICILE(ARMC)

## 2014-05-09 ENCOUNTER — Ambulatory Visit: Payer: Self-pay | Admitting: Family Medicine

## 2014-05-09 LAB — CLOSTRIDIUM DIFFICILE(ARMC)

## 2014-05-11 ENCOUNTER — Emergency Department: Payer: Self-pay | Admitting: Emergency Medicine

## 2014-05-11 LAB — CBC
HCT: 28.8 % — ABNORMAL LOW (ref 35.0–47.0)
HGB: 8.7 g/dL — ABNORMAL LOW (ref 12.0–16.0)
MCH: 30.2 pg (ref 26.0–34.0)
MCHC: 30.1 g/dL — ABNORMAL LOW (ref 32.0–36.0)
MCV: 100 fL (ref 80–100)
PLATELETS: 174 10*3/uL (ref 150–440)
RBC: 2.88 10*6/uL — AB (ref 3.80–5.20)
RDW: 25.7 % — ABNORMAL HIGH (ref 11.5–14.5)
WBC: 5.4 10*3/uL (ref 3.6–11.0)

## 2014-05-11 LAB — COMPREHENSIVE METABOLIC PANEL
ALT: 22 U/L
Albumin: 2.7 g/dL — ABNORMAL LOW (ref 3.4–5.0)
Alkaline Phosphatase: 106 U/L
Anion Gap: 10 (ref 7–16)
BUN: 20 mg/dL — ABNORMAL HIGH (ref 7–18)
Bilirubin,Total: 0.7 mg/dL (ref 0.2–1.0)
CALCIUM: 7.6 mg/dL — AB (ref 8.5–10.1)
Chloride: 105 mmol/L (ref 98–107)
Co2: 22 mmol/L (ref 21–32)
Creatinine: 7.67 mg/dL — ABNORMAL HIGH (ref 0.60–1.30)
EGFR (Non-African Amer.): 6 — ABNORMAL LOW
GFR CALC AF AMER: 7 — AB
GLUCOSE: 115 mg/dL — AB (ref 65–99)
Osmolality: 276 (ref 275–301)
POTASSIUM: 3.6 mmol/L (ref 3.5–5.1)
SGOT(AST): 41 U/L — ABNORMAL HIGH (ref 15–37)
Sodium: 137 mmol/L (ref 136–145)
Total Protein: 7.2 g/dL (ref 6.4–8.2)

## 2014-05-11 LAB — TROPONIN I: Troponin-I: 0.1 ng/mL — ABNORMAL HIGH

## 2014-05-11 LAB — PRO B NATRIURETIC PEPTIDE: B-Type Natriuretic Peptide: >175000 pg/mL — ABNORMAL HIGH (ref 0–125)

## 2014-05-14 IMAGING — XA IR VASCULAR PROCEDURE
6 series · 13 of 13 positions shown · non-contrast
Comparison: none

[Series 1: care upper arm · 3 of 3 slices shown (1 of 5)]
[im 1/3]
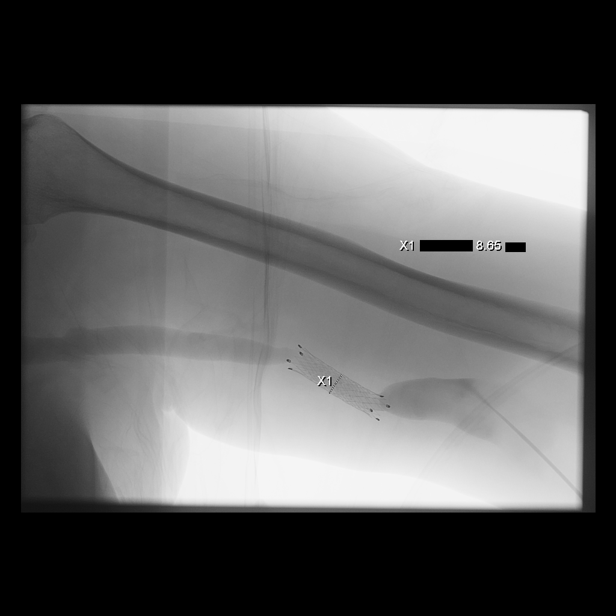
[im 2/3]
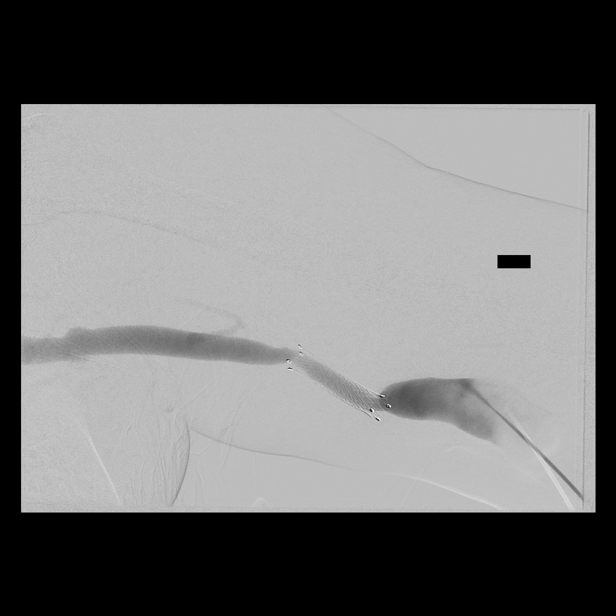
[im 3/3]
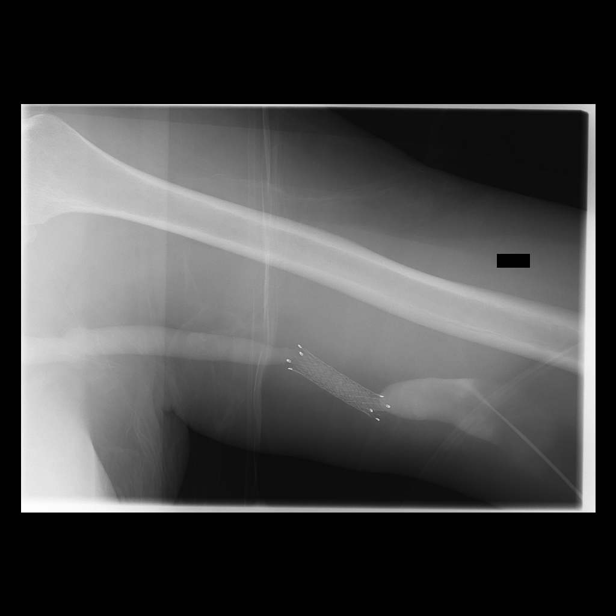

[Series 2: care upper arm · 2 of 2 slices shown (2 of 5)]
[im 1/2]
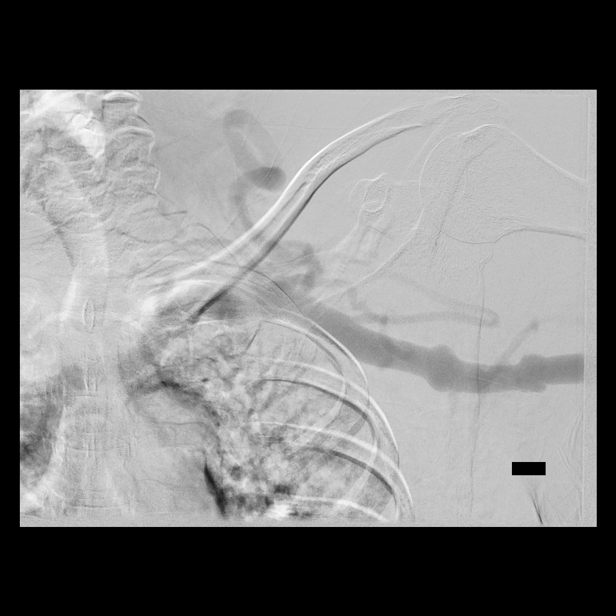
[im 2/2]
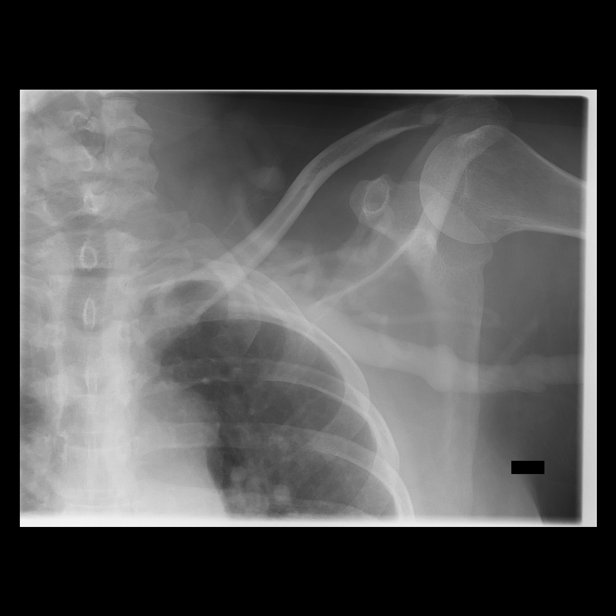

[Series 3: care upper arm · 2 of 2 slices shown (3 of 5)]
[im 1/2]
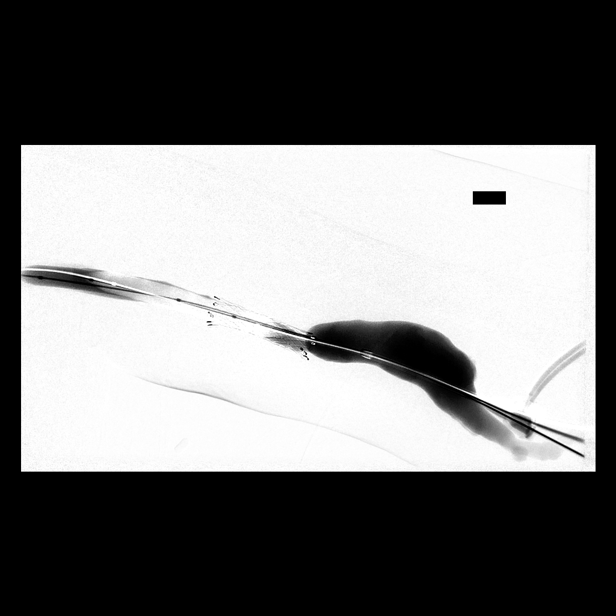
[im 2/2]
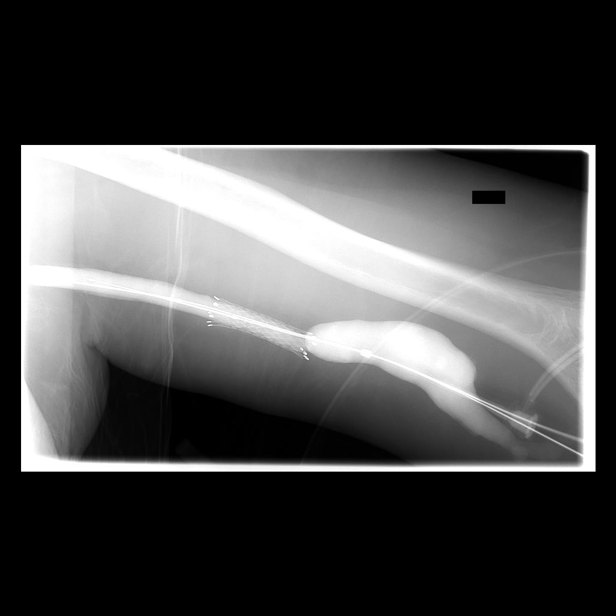

[Series 4: fl - angio · 1 of 1 slices shown]
[im 1/1]
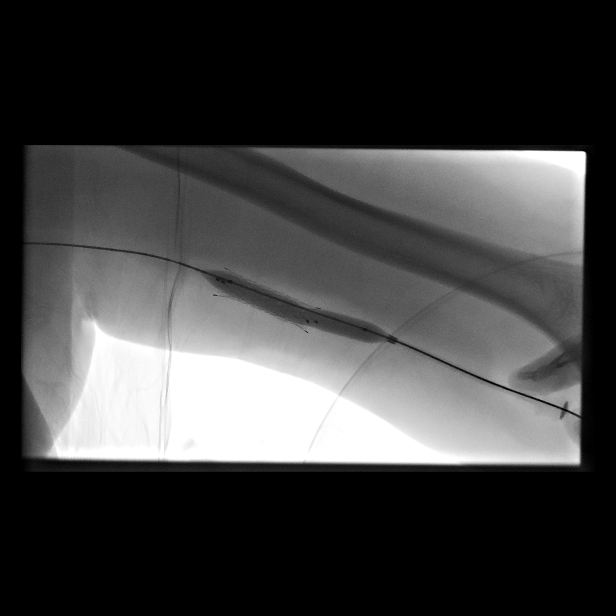

[Series 5: care upper arm · 2 of 2 slices shown (4 of 5)]
[im 1/2]
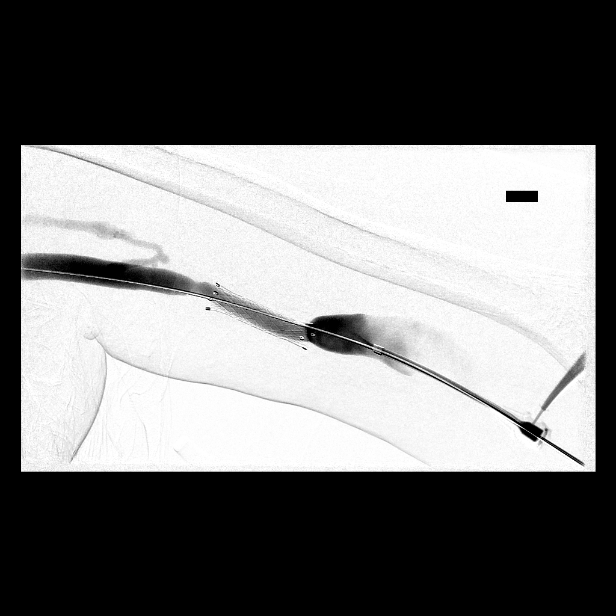
[im 2/2]
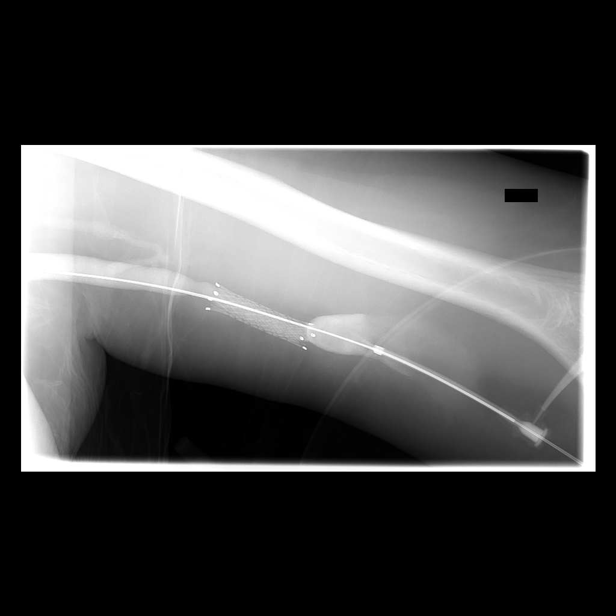

[Series 6: care upper arm · 3 of 3 slices shown (5 of 5)]
[im 1/3]
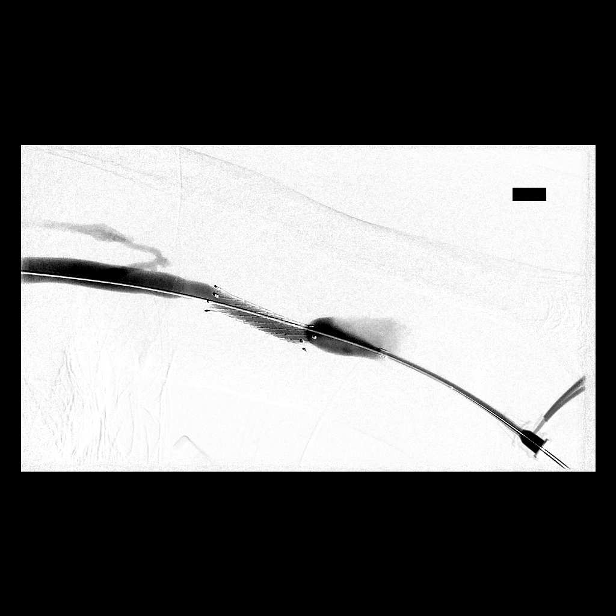
[im 2/3]
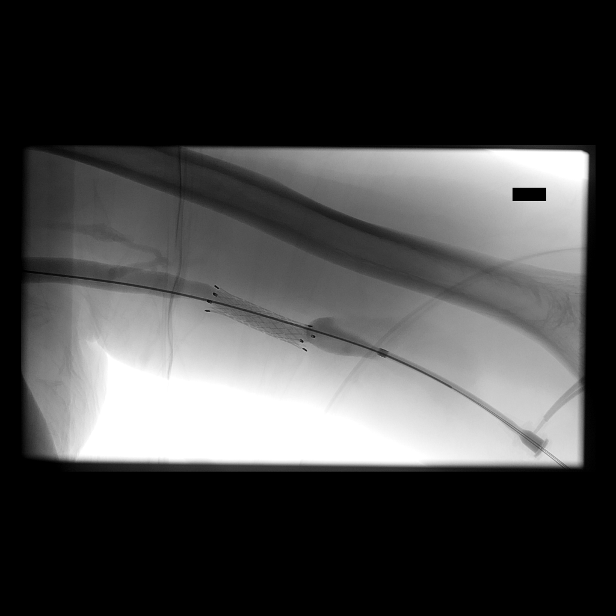
[im 3/3]
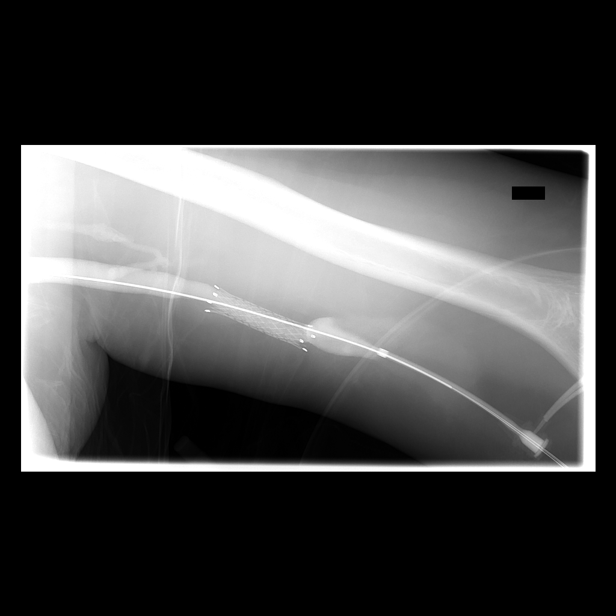

[13 of 13 positions shown; findings below may reference images not displayed]

IMAGES IMPORTED FROM THE [employer] WORKFLOW SYSTEM
NO DICTATION FOR STUDY

## 2014-06-08 ENCOUNTER — Emergency Department: Payer: Self-pay | Admitting: Emergency Medicine

## 2014-06-08 LAB — BASIC METABOLIC PANEL
Anion Gap: 10 (ref 7–16)
BUN: 18 mg/dL (ref 7–18)
CREATININE: 4.29 mg/dL — AB (ref 0.60–1.30)
Calcium, Total: 7.7 mg/dL — ABNORMAL LOW (ref 8.5–10.1)
Chloride: 100 mmol/L (ref 98–107)
Co2: 28 mmol/L (ref 21–32)
GFR CALC AF AMER: 14 — AB
GFR CALC NON AF AMER: 12 — AB
Glucose: 106 mg/dL — ABNORMAL HIGH (ref 65–99)
Osmolality: 278 (ref 275–301)
Potassium: 3.9 mmol/L (ref 3.5–5.1)
SODIUM: 138 mmol/L (ref 136–145)

## 2014-06-08 LAB — CBC
HCT: 34.3 % — ABNORMAL LOW (ref 35.0–47.0)
HGB: 10.5 g/dL — AB (ref 12.0–16.0)
MCH: 28.5 pg (ref 26.0–34.0)
MCHC: 30.5 g/dL — ABNORMAL LOW (ref 32.0–36.0)
MCV: 94 fL (ref 80–100)
PLATELETS: 204 10*3/uL (ref 150–440)
RBC: 3.67 10*6/uL — AB (ref 3.80–5.20)
RDW: 19.5 % — ABNORMAL HIGH (ref 11.5–14.5)
WBC: 6.3 10*3/uL (ref 3.6–11.0)

## 2014-06-08 LAB — TROPONIN I: Troponin-I: 0.06 ng/mL — ABNORMAL HIGH

## 2014-06-13 ENCOUNTER — Inpatient Hospital Stay: Payer: Self-pay | Admitting: Specialist

## 2014-06-13 LAB — COMPREHENSIVE METABOLIC PANEL
ALT: 45 U/L
ANION GAP: 11 (ref 7–16)
AST: 85 U/L — AB (ref 15–37)
Albumin: 3 g/dL — ABNORMAL LOW (ref 3.4–5.0)
Alkaline Phosphatase: 94 U/L
BILIRUBIN TOTAL: 0.6 mg/dL (ref 0.2–1.0)
BUN: 19 mg/dL — AB (ref 7–18)
CALCIUM: 8.1 mg/dL — AB (ref 8.5–10.1)
CO2: 30 mmol/L (ref 21–32)
CREATININE: 4.35 mg/dL — AB (ref 0.60–1.30)
Chloride: 96 mmol/L — ABNORMAL LOW (ref 98–107)
EGFR (African American): 14 — ABNORMAL LOW
GFR CALC NON AF AMER: 11 — AB
GLUCOSE: 102 mg/dL — AB (ref 65–99)
Osmolality: 276 (ref 275–301)
Potassium: 3.7 mmol/L (ref 3.5–5.1)
Sodium: 137 mmol/L (ref 136–145)
Total Protein: 8.5 g/dL — ABNORMAL HIGH (ref 6.4–8.2)

## 2014-06-13 LAB — CBC WITH DIFFERENTIAL/PLATELET
Basophil #: 0.1 10*3/uL (ref 0.0–0.1)
Basophil %: 2.1 %
Eosinophil #: 0.1 10*3/uL (ref 0.0–0.7)
Eosinophil %: 1.4 %
HCT: 35.3 % (ref 35.0–47.0)
HGB: 11 g/dL — ABNORMAL LOW (ref 12.0–16.0)
Lymphocyte #: 1.2 10*3/uL (ref 1.0–3.6)
Lymphocyte %: 25 %
MCH: 29 pg (ref 26.0–34.0)
MCHC: 31.3 g/dL — AB (ref 32.0–36.0)
MCV: 93 fL (ref 80–100)
Monocyte #: 0.8 x10 3/mm (ref 0.2–0.9)
Monocyte %: 17.3 %
Neutrophil #: 2.6 10*3/uL (ref 1.4–6.5)
Neutrophil %: 54.2 %
PLATELETS: 235 10*3/uL (ref 150–440)
RBC: 3.8 10*6/uL (ref 3.80–5.20)
RDW: 19 % — ABNORMAL HIGH (ref 11.5–14.5)
WBC: 4.9 10*3/uL (ref 3.6–11.0)

## 2014-06-13 LAB — PROTIME-INR
INR: 1.5
Prothrombin Time: 17.5 secs — ABNORMAL HIGH (ref 11.5–14.7)

## 2014-06-13 LAB — LIPASE, BLOOD: Lipase: 1044 U/L — ABNORMAL HIGH (ref 73–393)

## 2014-06-14 LAB — COMPREHENSIVE METABOLIC PANEL
ALBUMIN: 2.7 g/dL — AB (ref 3.4–5.0)
ANION GAP: 10 (ref 7–16)
Alkaline Phosphatase: 78 U/L
BUN: 27 mg/dL — ABNORMAL HIGH (ref 7–18)
Bilirubin,Total: 0.7 mg/dL (ref 0.2–1.0)
CALCIUM: 7.7 mg/dL — AB (ref 8.5–10.1)
CHLORIDE: 99 mmol/L (ref 98–107)
CO2: 26 mmol/L (ref 21–32)
Creatinine: 6.38 mg/dL — ABNORMAL HIGH (ref 0.60–1.30)
EGFR (Non-African Amer.): 7 — ABNORMAL LOW
GFR CALC AF AMER: 9 — AB
GLUCOSE: 75 mg/dL (ref 65–99)
OSMOLALITY: 274 (ref 275–301)
POTASSIUM: 4.3 mmol/L (ref 3.5–5.1)
SGOT(AST): 88 U/L — ABNORMAL HIGH (ref 15–37)
SGPT (ALT): 42 U/L
Sodium: 135 mmol/L — ABNORMAL LOW (ref 136–145)
Total Protein: 7.4 g/dL (ref 6.4–8.2)

## 2014-06-14 LAB — PROTIME-INR
INR: 1.4
Prothrombin Time: 17.2 secs — ABNORMAL HIGH (ref 11.5–14.7)

## 2014-06-14 LAB — LIPID PANEL
CHOLESTEROL: 79 mg/dL (ref 0–200)
HDL Cholesterol: 35 mg/dL — ABNORMAL LOW (ref 40–60)
LDL CHOLESTEROL, CALC: 27 mg/dL (ref 0–100)
TRIGLYCERIDES: 85 mg/dL (ref 0–200)
VLDL CHOLESTEROL, CALC: 17 mg/dL (ref 5–40)

## 2014-06-14 LAB — PHOSPHORUS: Phosphorus: 4.6 mg/dL (ref 2.5–4.9)

## 2014-06-14 LAB — LIPASE, BLOOD: LIPASE: 413 U/L — AB (ref 73–393)

## 2014-06-15 LAB — COMPREHENSIVE METABOLIC PANEL
AST: 114 U/L — AB (ref 15–37)
Albumin: 2.8 g/dL — ABNORMAL LOW (ref 3.4–5.0)
Alkaline Phosphatase: 90 U/L
Anion Gap: 6 — ABNORMAL LOW (ref 7–16)
BUN: 13 mg/dL (ref 7–18)
Bilirubin,Total: 0.6 mg/dL (ref 0.2–1.0)
CO2: 32 mmol/L (ref 21–32)
Calcium, Total: 8.1 mg/dL — ABNORMAL LOW (ref 8.5–10.1)
Chloride: 96 mmol/L — ABNORMAL LOW (ref 98–107)
Creatinine: 4.15 mg/dL — ABNORMAL HIGH (ref 0.60–1.30)
EGFR (African American): 15 — ABNORMAL LOW
GFR CALC NON AF AMER: 12 — AB
Glucose: 87 mg/dL (ref 65–99)
OSMOLALITY: 268 (ref 275–301)
Potassium: 3.9 mmol/L (ref 3.5–5.1)
SGPT (ALT): 61 U/L
Sodium: 134 mmol/L — ABNORMAL LOW (ref 136–145)
Total Protein: 7.5 g/dL (ref 6.4–8.2)

## 2014-06-15 LAB — PROTIME-INR
INR: 1.8
Prothrombin Time: 20.5 secs — ABNORMAL HIGH (ref 11.5–14.7)

## 2014-06-15 LAB — LIPASE, BLOOD: LIPASE: 409 U/L — AB (ref 73–393)

## 2014-06-22 ENCOUNTER — Emergency Department: Payer: Self-pay | Admitting: Emergency Medicine

## 2014-06-23 LAB — BASIC METABOLIC PANEL
Anion Gap: 10 (ref 7–16)
BUN: 14 mg/dL (ref 7–18)
CREATININE: 3.59 mg/dL — AB (ref 0.60–1.30)
Calcium, Total: 8.5 mg/dL (ref 8.5–10.1)
Chloride: 98 mmol/L (ref 98–107)
Co2: 28 mmol/L (ref 21–32)
GFR CALC AF AMER: 17 — AB
GFR CALC NON AF AMER: 14 — AB
GLUCOSE: 118 mg/dL — AB (ref 65–99)
Osmolality: 274 (ref 275–301)
Potassium: 3.3 mmol/L — ABNORMAL LOW (ref 3.5–5.1)
Sodium: 136 mmol/L (ref 136–145)

## 2014-06-23 LAB — CBC
HCT: 39.1 % (ref 35.0–47.0)
HGB: 11.9 g/dL — ABNORMAL LOW (ref 12.0–16.0)
MCH: 28 pg (ref 26.0–34.0)
MCHC: 30.4 g/dL — ABNORMAL LOW (ref 32.0–36.0)
MCV: 92 fL (ref 80–100)
Platelet: 174 10*3/uL (ref 150–440)
RBC: 4.25 10*6/uL (ref 3.80–5.20)
RDW: 19.9 % — ABNORMAL HIGH (ref 11.5–14.5)
WBC: 4.9 10*3/uL (ref 3.6–11.0)

## 2014-06-23 LAB — PROTIME-INR
INR: 1.5
Prothrombin Time: 17.4 secs — ABNORMAL HIGH (ref 11.5–14.7)

## 2014-06-24 ENCOUNTER — Inpatient Hospital Stay: Payer: Self-pay | Admitting: Internal Medicine

## 2014-06-24 LAB — TROPONIN I: Troponin-I: 0.06 ng/mL — ABNORMAL HIGH

## 2014-06-24 LAB — CK TOTAL AND CKMB (NOT AT ARMC)
CK, TOTAL: 42 U/L (ref 26–192)
CK-MB: 1.2 ng/mL (ref 0.5–3.6)

## 2014-06-24 LAB — SEDIMENTATION RATE: ERYTHROCYTE SED RATE: 45 mm/h — AB (ref 0–30)

## 2014-06-25 LAB — PROTIME-INR
INR: 1.6
PROTHROMBIN TIME: 18.8 s — AB (ref 11.5–14.7)

## 2014-06-25 LAB — TROPONIN I: Troponin-I: 0.07 ng/mL — ABNORMAL HIGH

## 2014-06-25 LAB — HEMOGLOBIN: HGB: 10.7 g/dL — ABNORMAL LOW (ref 12.0–16.0)

## 2014-06-26 LAB — RENAL FUNCTION PANEL
ALBUMIN: 2.7 g/dL — AB (ref 3.4–5.0)
ANION GAP: 11 (ref 7–16)
BUN: 44 mg/dL — AB (ref 7–18)
CHLORIDE: 99 mmol/L (ref 98–107)
CREATININE: 8.19 mg/dL — AB (ref 0.60–1.30)
Calcium, Total: 7.2 mg/dL — ABNORMAL LOW (ref 8.5–10.1)
Co2: 25 mmol/L (ref 21–32)
EGFR (African American): 7 — ABNORMAL LOW
GFR CALC NON AF AMER: 5 — AB
Glucose: 119 mg/dL — ABNORMAL HIGH (ref 65–99)
OSMOLALITY: 282 (ref 275–301)
Phosphorus: 3.3 mg/dL (ref 2.5–4.9)
Potassium: 4.4 mmol/L (ref 3.5–5.1)
SODIUM: 135 mmol/L — AB (ref 136–145)

## 2014-06-26 LAB — PROTIME-INR
INR: 1.9
PROTHROMBIN TIME: 21.2 s — AB (ref 11.5–14.7)

## 2014-07-02 LAB — CBC
HCT: 36.8 % (ref 35.0–47.0)
HGB: 11.1 g/dL — ABNORMAL LOW (ref 12.0–16.0)
MCH: 28 pg (ref 26.0–34.0)
MCHC: 30.1 g/dL — ABNORMAL LOW (ref 32.0–36.0)
MCV: 93 fL (ref 80–100)
Platelet: 186 10*3/uL (ref 150–440)
RBC: 3.95 10*6/uL (ref 3.80–5.20)
RDW: 20.1 % — ABNORMAL HIGH (ref 11.5–14.5)
WBC: 5.4 10*3/uL (ref 3.6–11.0)

## 2014-07-02 LAB — COMPREHENSIVE METABOLIC PANEL
ANION GAP: 9 (ref 7–16)
AST: 92 U/L — AB (ref 15–37)
Albumin: 2.9 g/dL — ABNORMAL LOW (ref 3.4–5.0)
Alkaline Phosphatase: 99 U/L
BUN: 39 mg/dL — AB (ref 7–18)
Bilirubin,Total: 0.6 mg/dL (ref 0.2–1.0)
CHLORIDE: 102 mmol/L (ref 98–107)
Calcium, Total: 7.9 mg/dL — ABNORMAL LOW (ref 8.5–10.1)
Co2: 23 mmol/L (ref 21–32)
Creatinine: 7.43 mg/dL — ABNORMAL HIGH (ref 0.60–1.30)
EGFR (African American): 7 — ABNORMAL LOW
EGFR (Non-African Amer.): 6 — ABNORMAL LOW
GLUCOSE: 90 mg/dL (ref 65–99)
Osmolality: 277 (ref 275–301)
POTASSIUM: 6.5 mmol/L — AB (ref 3.5–5.1)
SGPT (ALT): 40 U/L
SODIUM: 134 mmol/L — AB (ref 136–145)
TOTAL PROTEIN: 8.1 g/dL (ref 6.4–8.2)

## 2014-07-02 LAB — PROTIME-INR
INR: 1.8
Prothrombin Time: 20.1 secs — ABNORMAL HIGH (ref 11.5–14.7)

## 2014-07-02 LAB — POTASSIUM: POTASSIUM: 5.2 mmol/L — AB (ref 3.5–5.1)

## 2014-07-02 LAB — LIPASE, BLOOD: LIPASE: 282 U/L (ref 73–393)

## 2014-07-03 ENCOUNTER — Inpatient Hospital Stay: Payer: Self-pay | Admitting: Internal Medicine

## 2014-07-03 LAB — BASIC METABOLIC PANEL
Anion Gap: 9 (ref 7–16)
BUN: 46 mg/dL — ABNORMAL HIGH (ref 7–18)
CALCIUM: 7.9 mg/dL — AB (ref 8.5–10.1)
CHLORIDE: 101 mmol/L (ref 98–107)
Co2: 27 mmol/L (ref 21–32)
Creatinine: 8.39 mg/dL — ABNORMAL HIGH (ref 0.60–1.30)
GFR CALC AF AMER: 6 — AB
GFR CALC NON AF AMER: 5 — AB
Glucose: 97 mg/dL (ref 65–99)
Osmolality: 286 (ref 275–301)
POTASSIUM: 5.7 mmol/L — AB (ref 3.5–5.1)
Sodium: 137 mmol/L (ref 136–145)

## 2014-07-03 LAB — CBC WITH DIFFERENTIAL/PLATELET
BASOS ABS: 0 10*3/uL (ref 0.0–0.1)
Basophil %: 0.7 %
Eosinophil #: 0.1 10*3/uL (ref 0.0–0.7)
Eosinophil %: 2.8 %
HCT: 35.3 % (ref 35.0–47.0)
HGB: 10.8 g/dL — AB (ref 12.0–16.0)
LYMPHS ABS: 1.2 10*3/uL (ref 1.0–3.6)
Lymphocyte %: 24.4 %
MCH: 28.4 pg (ref 26.0–34.0)
MCHC: 30.5 g/dL — AB (ref 32.0–36.0)
MCV: 93 fL (ref 80–100)
MONOS PCT: 16.8 %
Monocyte #: 0.9 x10 3/mm (ref 0.2–0.9)
NEUTROS ABS: 2.8 10*3/uL (ref 1.4–6.5)
Neutrophil %: 55.3 %
Platelet: 167 10*3/uL (ref 150–440)
RBC: 3.79 10*6/uL — ABNORMAL LOW (ref 3.80–5.20)
RDW: 19.7 % — AB (ref 11.5–14.5)
WBC: 5.1 10*3/uL (ref 3.6–11.0)

## 2014-07-03 LAB — PHOSPHORUS: Phosphorus: 5.3 mg/dL — ABNORMAL HIGH (ref 2.5–4.9)

## 2014-07-03 LAB — PROTIME-INR
INR: 1.9
Prothrombin Time: 21.4 secs — ABNORMAL HIGH (ref 11.5–14.7)

## 2014-07-04 LAB — PROTIME-INR
INR: 2
Prothrombin Time: 22.2 secs — ABNORMAL HIGH (ref 11.5–14.7)

## 2014-07-04 LAB — POTASSIUM: POTASSIUM: 4.3 mmol/L (ref 3.5–5.1)

## 2014-07-07 LAB — CBC WITH DIFFERENTIAL/PLATELET
Basophil #: 0.1 10*3/uL (ref 0.0–0.1)
Basophil %: 1 %
EOS ABS: 0.1 10*3/uL (ref 0.0–0.7)
EOS PCT: 0.9 %
HCT: 38.2 % (ref 35.0–47.0)
HGB: 11.7 g/dL — AB (ref 12.0–16.0)
LYMPHS ABS: 1.5 10*3/uL (ref 1.0–3.6)
Lymphocyte %: 27.7 %
MCH: 28.2 pg (ref 26.0–34.0)
MCHC: 30.7 g/dL — ABNORMAL LOW (ref 32.0–36.0)
MCV: 92 fL (ref 80–100)
MONO ABS: 0.8 x10 3/mm (ref 0.2–0.9)
Monocyte %: 14.8 %
NEUTROS PCT: 55.6 %
Neutrophil #: 3.1 10*3/uL (ref 1.4–6.5)
Platelet: 199 10*3/uL (ref 150–440)
RBC: 4.16 10*6/uL (ref 3.80–5.20)
RDW: 19.8 % — ABNORMAL HIGH (ref 11.5–14.5)
WBC: 5.5 10*3/uL (ref 3.6–11.0)

## 2014-07-07 LAB — COMPREHENSIVE METABOLIC PANEL
ANION GAP: 12 (ref 7–16)
AST: 76 U/L — AB (ref 15–37)
Albumin: 3.1 g/dL — ABNORMAL LOW (ref 3.4–5.0)
Alkaline Phosphatase: 107 U/L
BUN: 56 mg/dL — ABNORMAL HIGH (ref 7–18)
Bilirubin,Total: 0.7 mg/dL (ref 0.2–1.0)
CALCIUM: 8 mg/dL — AB (ref 8.5–10.1)
CHLORIDE: 101 mmol/L (ref 98–107)
Co2: 22 mmol/L (ref 21–32)
Creatinine: 7.74 mg/dL — ABNORMAL HIGH (ref 0.60–1.30)
EGFR (African American): 7 — ABNORMAL LOW
EGFR (Non-African Amer.): 6 — ABNORMAL LOW
Glucose: 69 mg/dL (ref 65–99)
Osmolality: 284 (ref 275–301)
POTASSIUM: 7 mmol/L — AB (ref 3.5–5.1)
SGPT (ALT): 41 U/L
Sodium: 135 mmol/L — ABNORMAL LOW (ref 136–145)
Total Protein: 8.4 g/dL — ABNORMAL HIGH (ref 6.4–8.2)

## 2014-07-07 LAB — TROPONIN I: TROPONIN-I: 0.06 ng/mL — AB

## 2014-07-07 LAB — LIPASE, BLOOD: LIPASE: 343 U/L (ref 73–393)

## 2014-07-08 ENCOUNTER — Inpatient Hospital Stay: Payer: Self-pay | Admitting: Internal Medicine

## 2014-07-09 LAB — BASIC METABOLIC PANEL
Anion Gap: 11 (ref 7–16)
BUN: 39 mg/dL — ABNORMAL HIGH (ref 7–18)
CALCIUM: 8.3 mg/dL — AB (ref 8.5–10.1)
CO2: 30 mmol/L (ref 21–32)
Chloride: 98 mmol/L (ref 98–107)
Creatinine: 5.53 mg/dL — ABNORMAL HIGH (ref 0.60–1.30)
EGFR (African American): 10 — ABNORMAL LOW
EGFR (Non-African Amer.): 9 — ABNORMAL LOW
Glucose: 107 mg/dL — ABNORMAL HIGH (ref 65–99)
Osmolality: 287 (ref 275–301)
POTASSIUM: 4.3 mmol/L (ref 3.5–5.1)
Sodium: 139 mmol/L (ref 136–145)

## 2014-07-09 LAB — PROTIME-INR
INR: 1.9
Prothrombin Time: 21.4 secs — ABNORMAL HIGH (ref 11.5–14.7)

## 2014-07-09 LAB — PHOSPHORUS: Phosphorus: 3.9 mg/dL (ref 2.5–4.9)

## 2014-07-10 ENCOUNTER — Ambulatory Visit: Payer: Self-pay | Admitting: Vascular Surgery

## 2014-07-11 ENCOUNTER — Ambulatory Visit: Payer: Self-pay | Admitting: Vascular Surgery

## 2014-07-11 LAB — POTASSIUM: Potassium: 4.7 mmol/L (ref 3.5–5.1)

## 2014-07-11 LAB — PROTIME-INR
INR: 1.6
Prothrombin Time: 18.3 secs — ABNORMAL HIGH (ref 11.5–14.7)

## 2014-07-29 ENCOUNTER — Emergency Department: Payer: Self-pay | Admitting: Internal Medicine

## 2014-08-03 ENCOUNTER — Inpatient Hospital Stay: Payer: Self-pay | Admitting: Internal Medicine

## 2014-08-04 DIAGNOSIS — R079 Chest pain, unspecified: Secondary | ICD-10-CM

## 2014-08-10 ENCOUNTER — Emergency Department: Payer: Self-pay | Admitting: Student

## 2014-08-12 ENCOUNTER — Emergency Department: Payer: Self-pay | Admitting: Internal Medicine

## 2014-08-26 IMAGING — CR DG CHEST 2V
1 series · 2 of 2 positions shown · non-contrast
Comparison: none

REASON FOR EXAM: shortness of breath
COMMENTS:

PROCEDURE:     DXR - DXR CHEST PA (OR AP) AND LATERAL  - September 11, 2012  [DATE]
RESULT:     Comparison is made to prior study dated 10/14/2010.

[Series 2: x chest ap · 0.14mm/px · 2 of 2 slices shown]
[im 1/2]
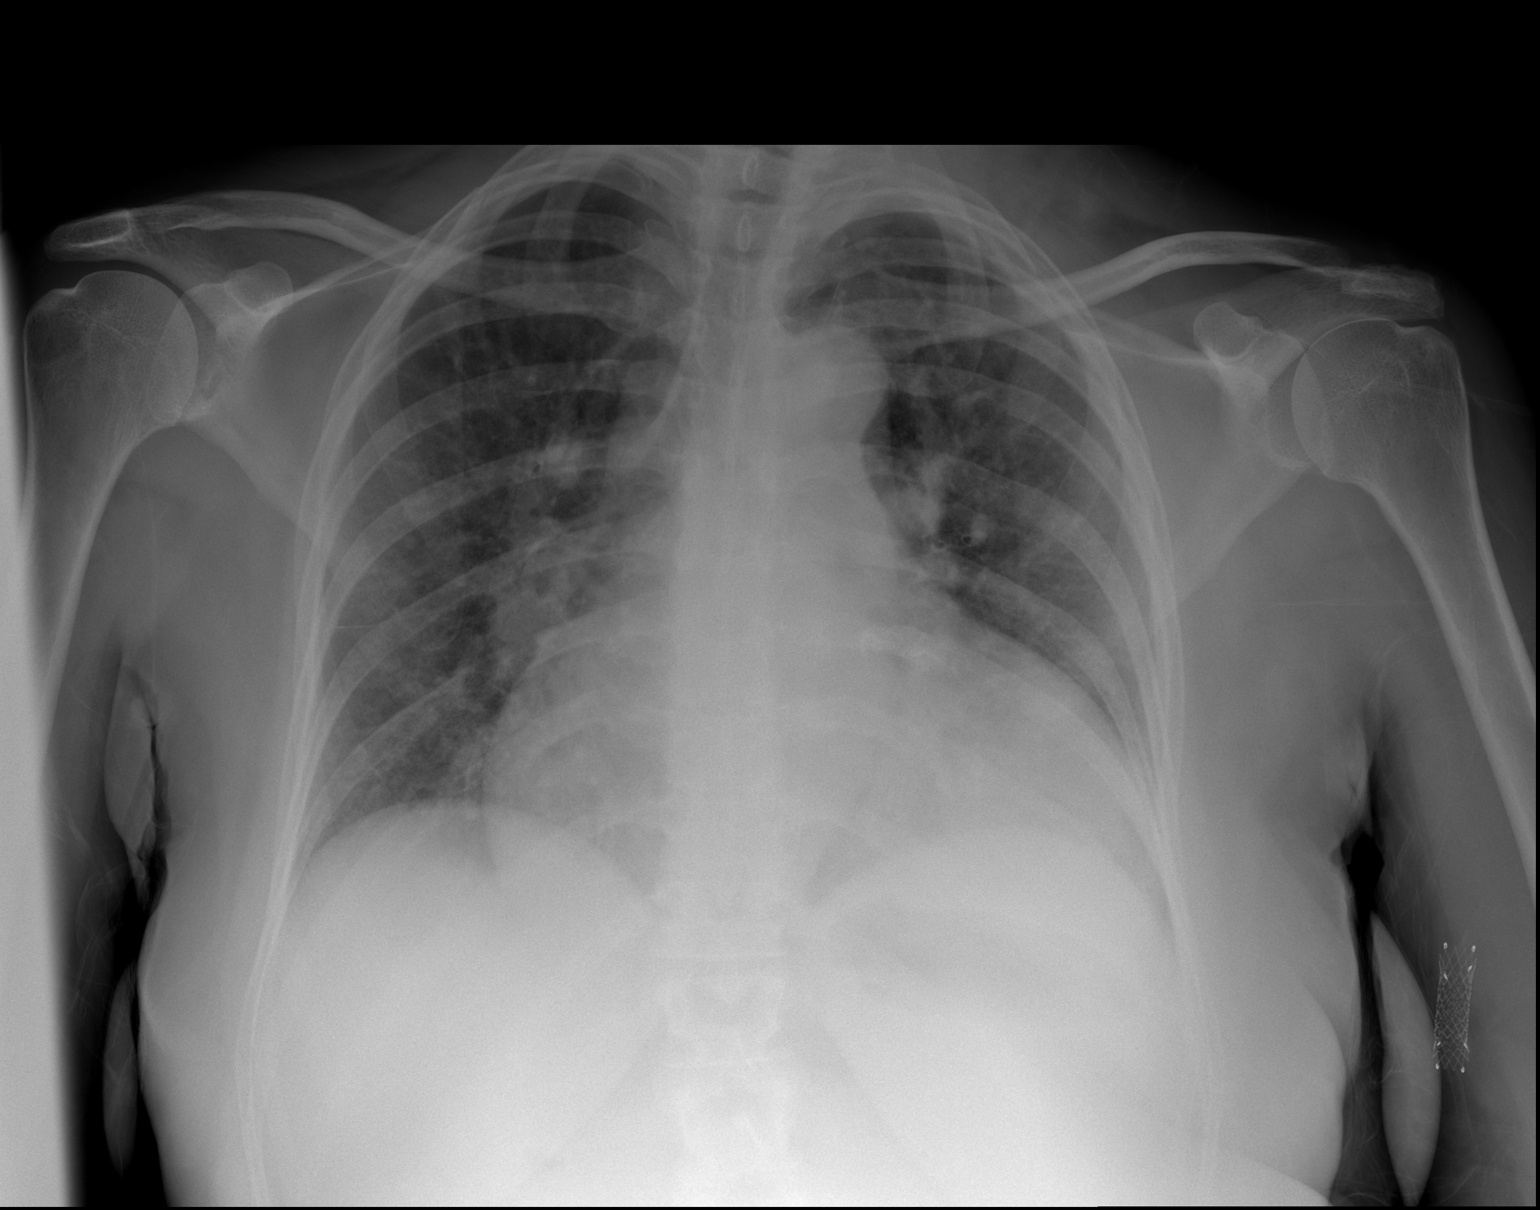
[im 2/2]
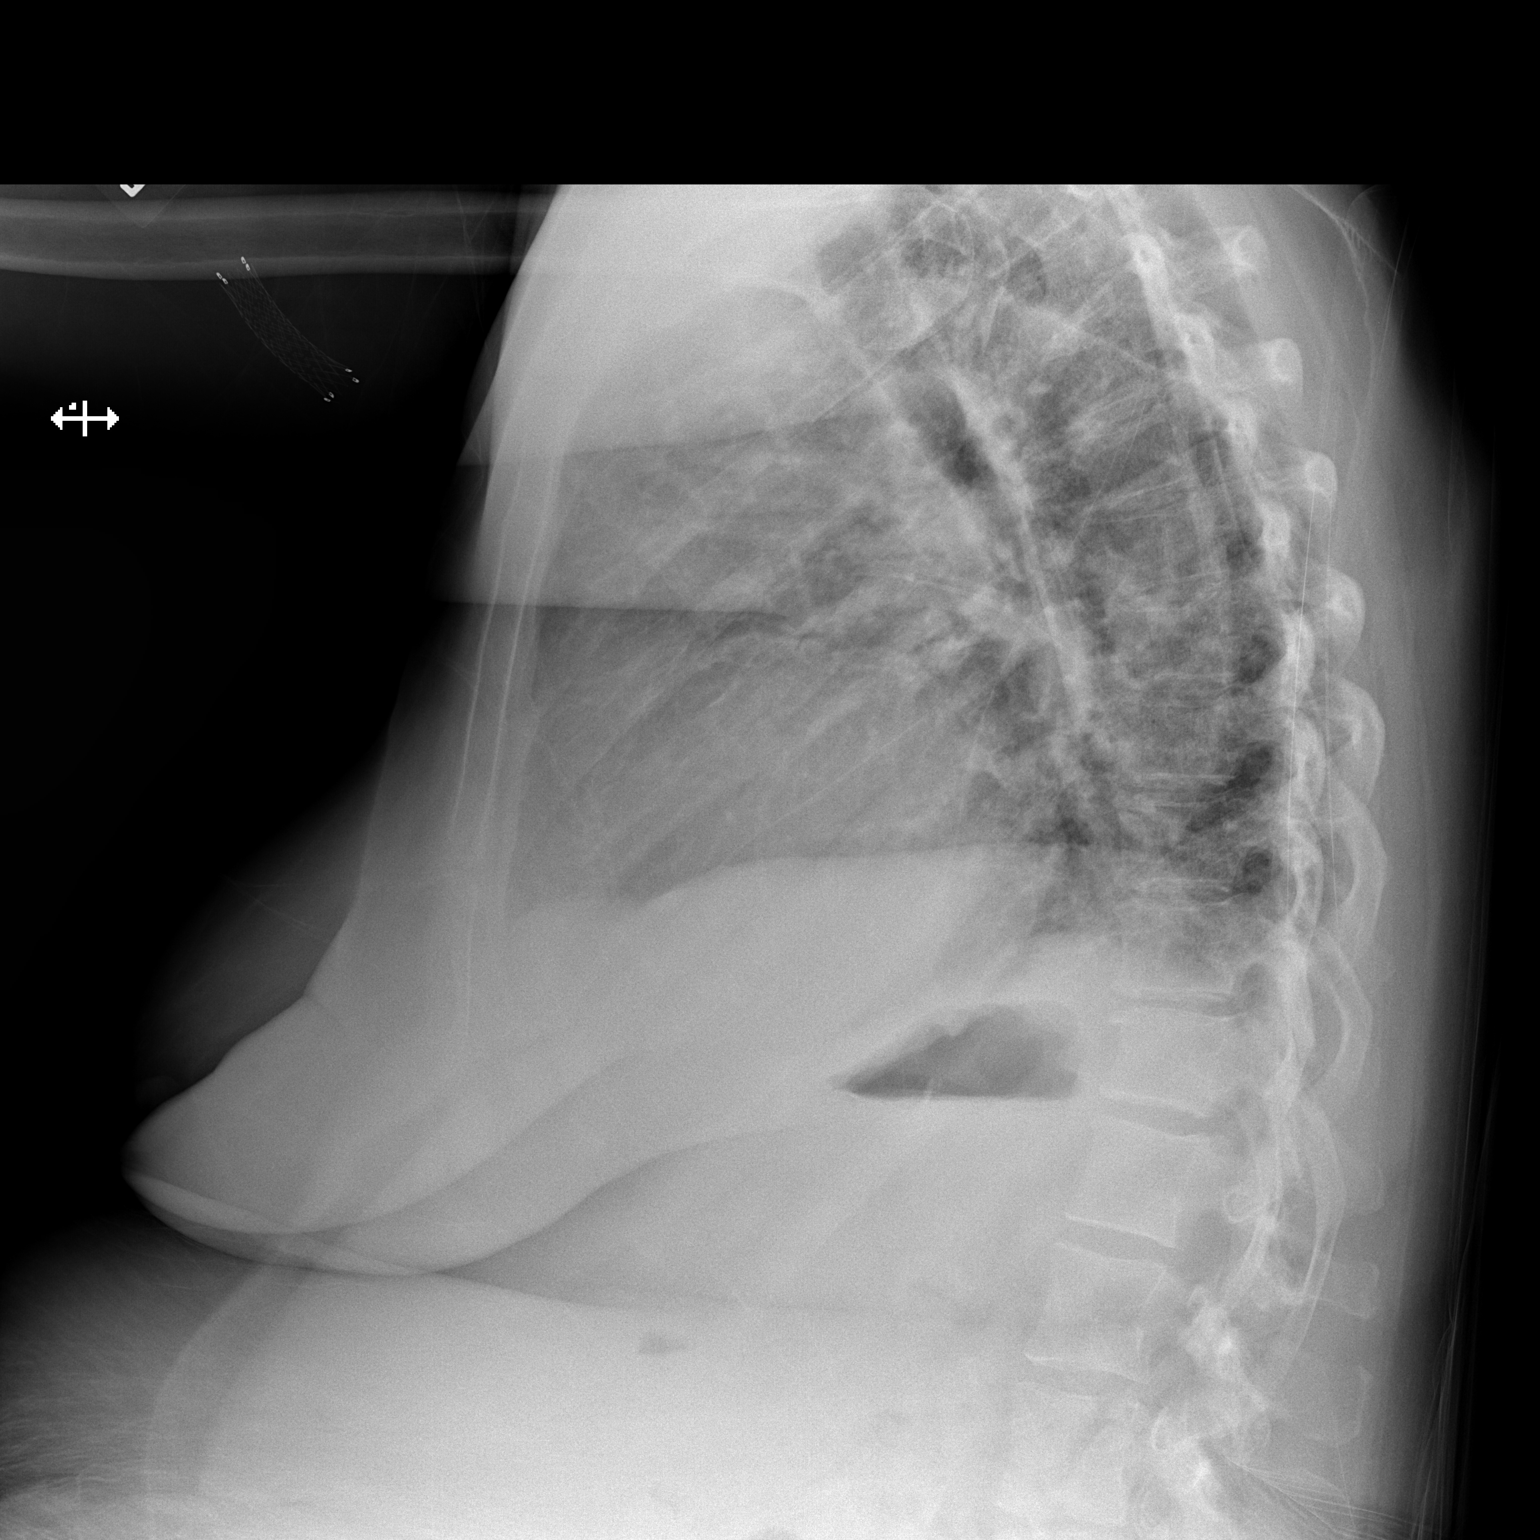

[2 of 2 positions shown; findings below may reference images not displayed]

FINDINGS: The patient has taken a shallow inspiration. There is prominence
of the interstitial markings and mild peribronchial cuffing. The cardiac
silhouette is enlarged indicative of cardiomegaly. No focal regions of
consolidation or focal infiltrates are appreciated. The visualized bony
skeleton is unremarkable.
IMPRESSION: 1. Interstitial infiltrate likely representing pulmonary edema. An
infectious or inflammatory etiology cannot be excluded. Surveillance
evaluation is recommended status post appropriate therapeutic regimen.

## 2014-08-26 IMAGING — CT CT HEAD WITHOUT CONTRAST
1 series · 15 of 30 positions shown, 19 images · non-contrast
Comparison: none

REASON FOR EXAM: confusion
COMMENTS:

PROCEDURE:     CT  - CT HEAD WITHOUT CONTRAST  - September 11, 2012 [DATE]
RESULT:     Head CT dated 09/11/2012 . Comparison in a prior study dated
05/21/2010.
TECHNIQUE: Helical noncontrasted 5 mm sections were obtained from skull base
to the vertex.

[Series 2: soft tissue · axial · 0.43mm/px · z∈[-182,-32]mm · 15 of 34 slices shown, 19 images]
[im 2/34  brain]
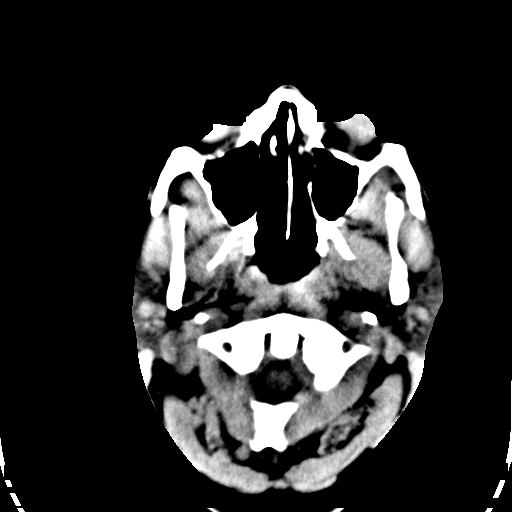
[im 2/34  bone]
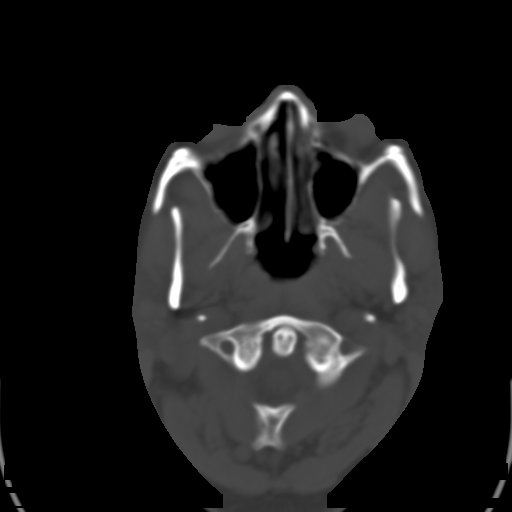
[im 4/34  brain]
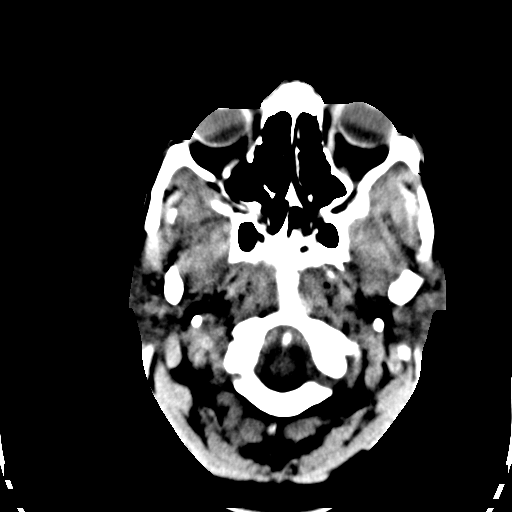
[im 6/34  brain]
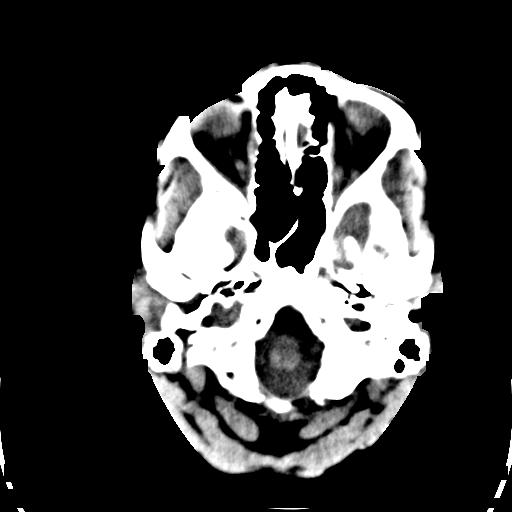
[im 8/34  brain]
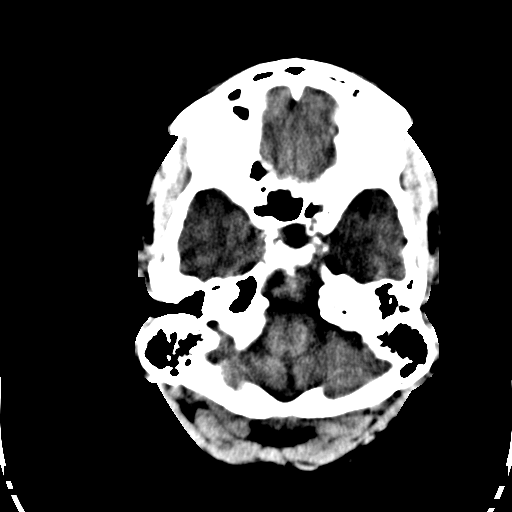
[im 11/34  brain]
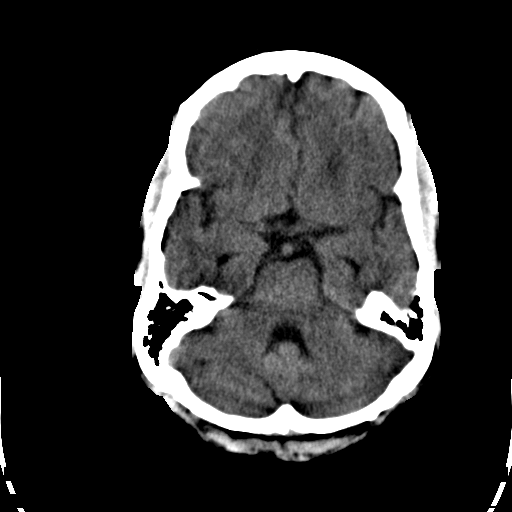
[im 11/34  bone]
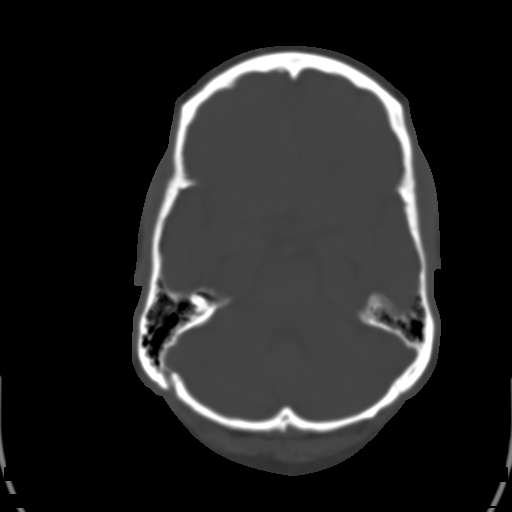
[im 13/34  brain]
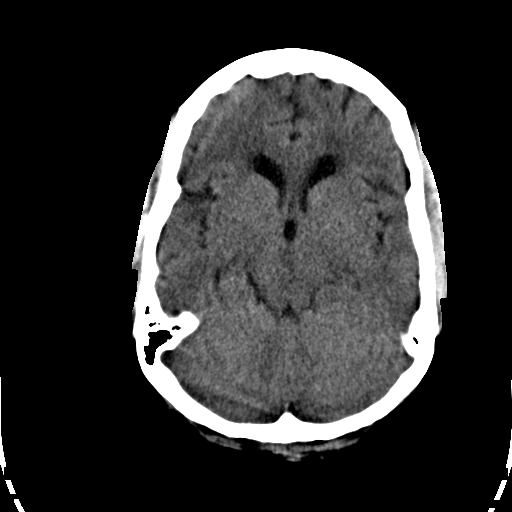
[im 15/34  brain]
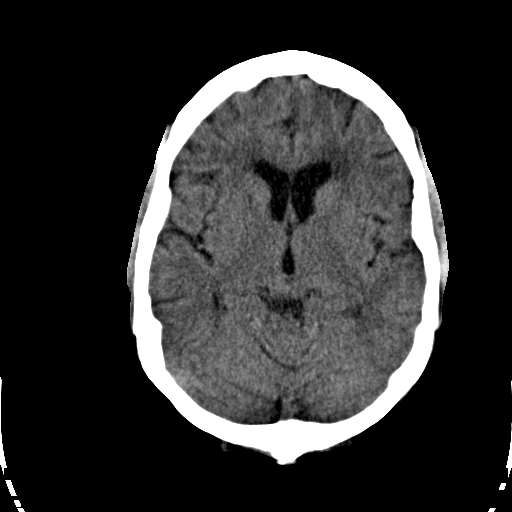
[im 18/34  brain]
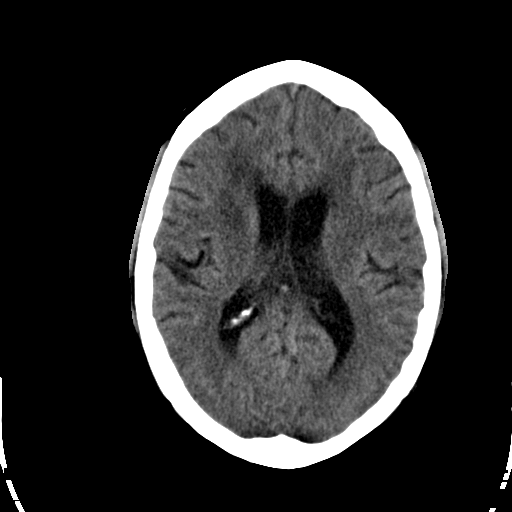
[im 19/34  brain]
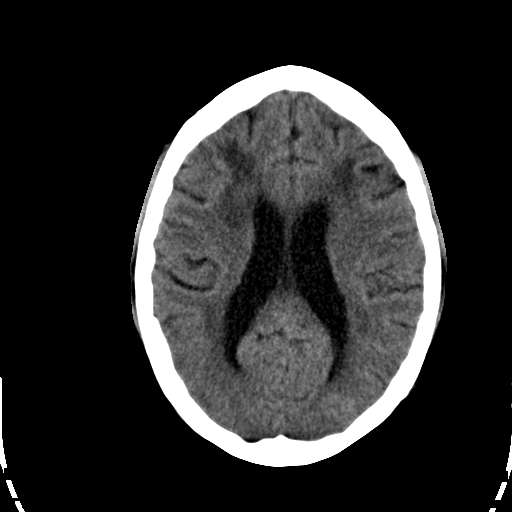
[im 19/34  bone]
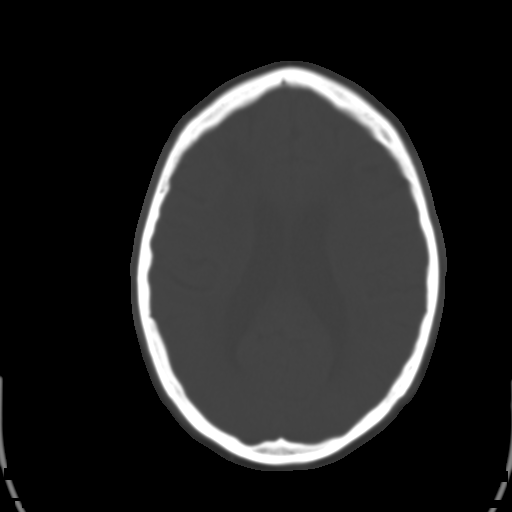
[im 21/34  brain]
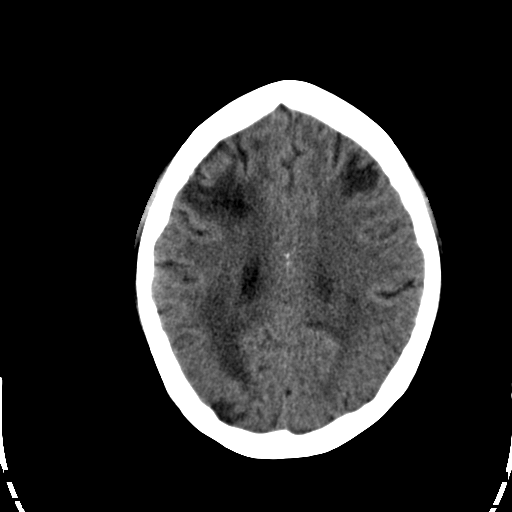
[im 23/34  brain]
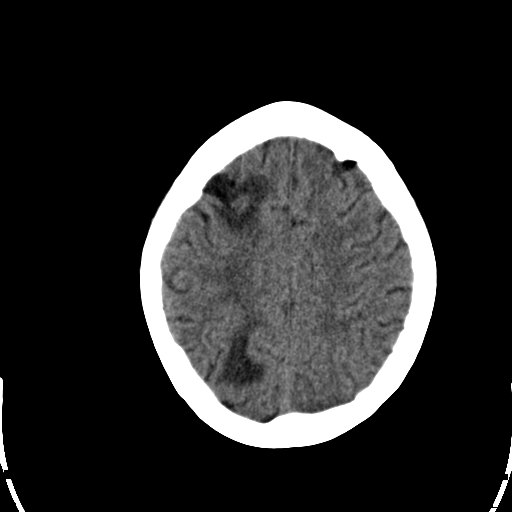
[im 26/34  brain]
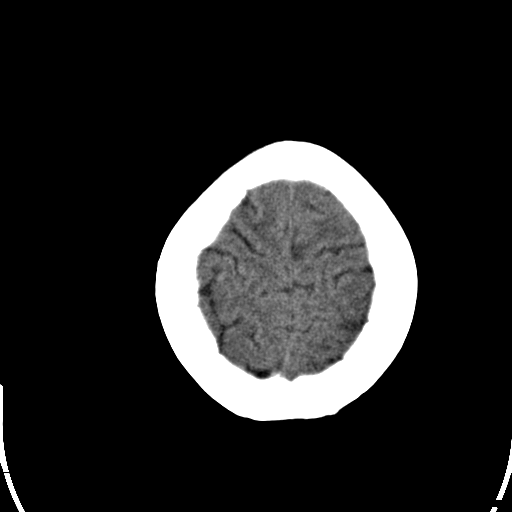
[im 28/34  brain]
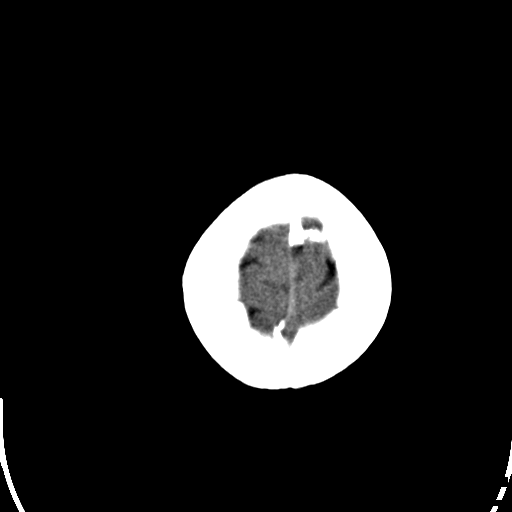
[im 28/34  bone]
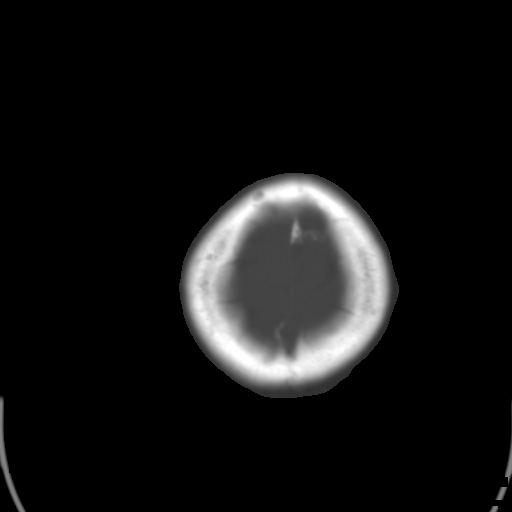
[im 30/34  brain]
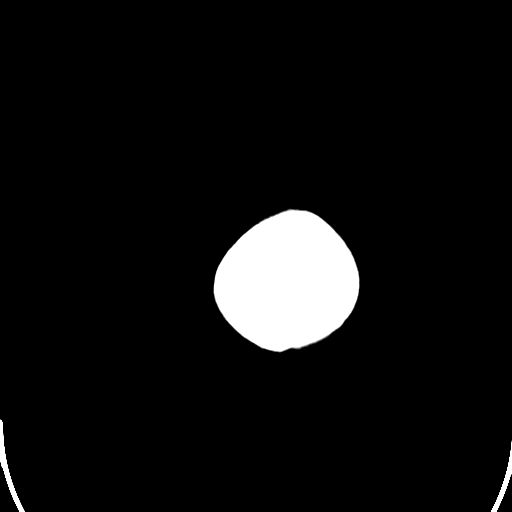
[im 32/34  brain]
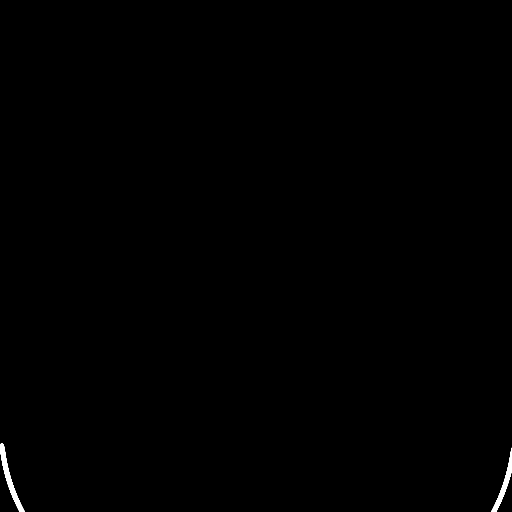

[15 of 30 positions shown; findings below may reference images not displayed]

FINDINGS: Focal areas of low-attenuation project within the apex of the left
frontal lobe anteriorly, right frontal lobe and along the posterior vertex
of the right frontal lobe. When compared to the previous study these areas
have a more well-defined increased low attenuating appearance an and the
appearance of areas of encephalomalacic change. These areas have the
appearance of regions of chronic foci of lacunar infarction. There is no
evidence of associated mass effect no more findings to suggest surrounding
vasogenic edema.

Diffuse areas of low-attenuation project within the subcortical,
periventricular and deep white matter regions. There is no evidence of
subfalcine or tonsillar herniation no evidence of acute hemorrhage. The
osseous structures demonstrate no evidence of a depressed skull fracture.
The visualized paranasal sinuses and mastoid air cells are patent.
IMPRESSION: Findings within the right and left frontal regions along
the vertex exam the appearance of areas of chronic lacunar infarction with
subsequent encephalomalacic change. These findings are appreciated on the
previous study as described above. Clinically warranted further evaluation
with MRI is recommended.
2. Findings consistent with changes due to microangiopathy.
3. No evidence of acute abnormalities.

## 2014-08-27 IMAGING — CR DG CHEST 1V
1 series · 1 of 1 positions shown · non-contrast
Comparison: none

REASON FOR EXAM: fever
COMMENTS:

PROCEDURE:     DXR - DXR CHEST 1 VIEWAP OR PA  - September 12, 2012 [DATE]
RESULT:     Comparison is made to the previous exam dated 11 September, 2012.
The cardiac silhouette is enlarged. The lungs appear clear. The bony and
mediastinal structures are unremarkable.

[ap]
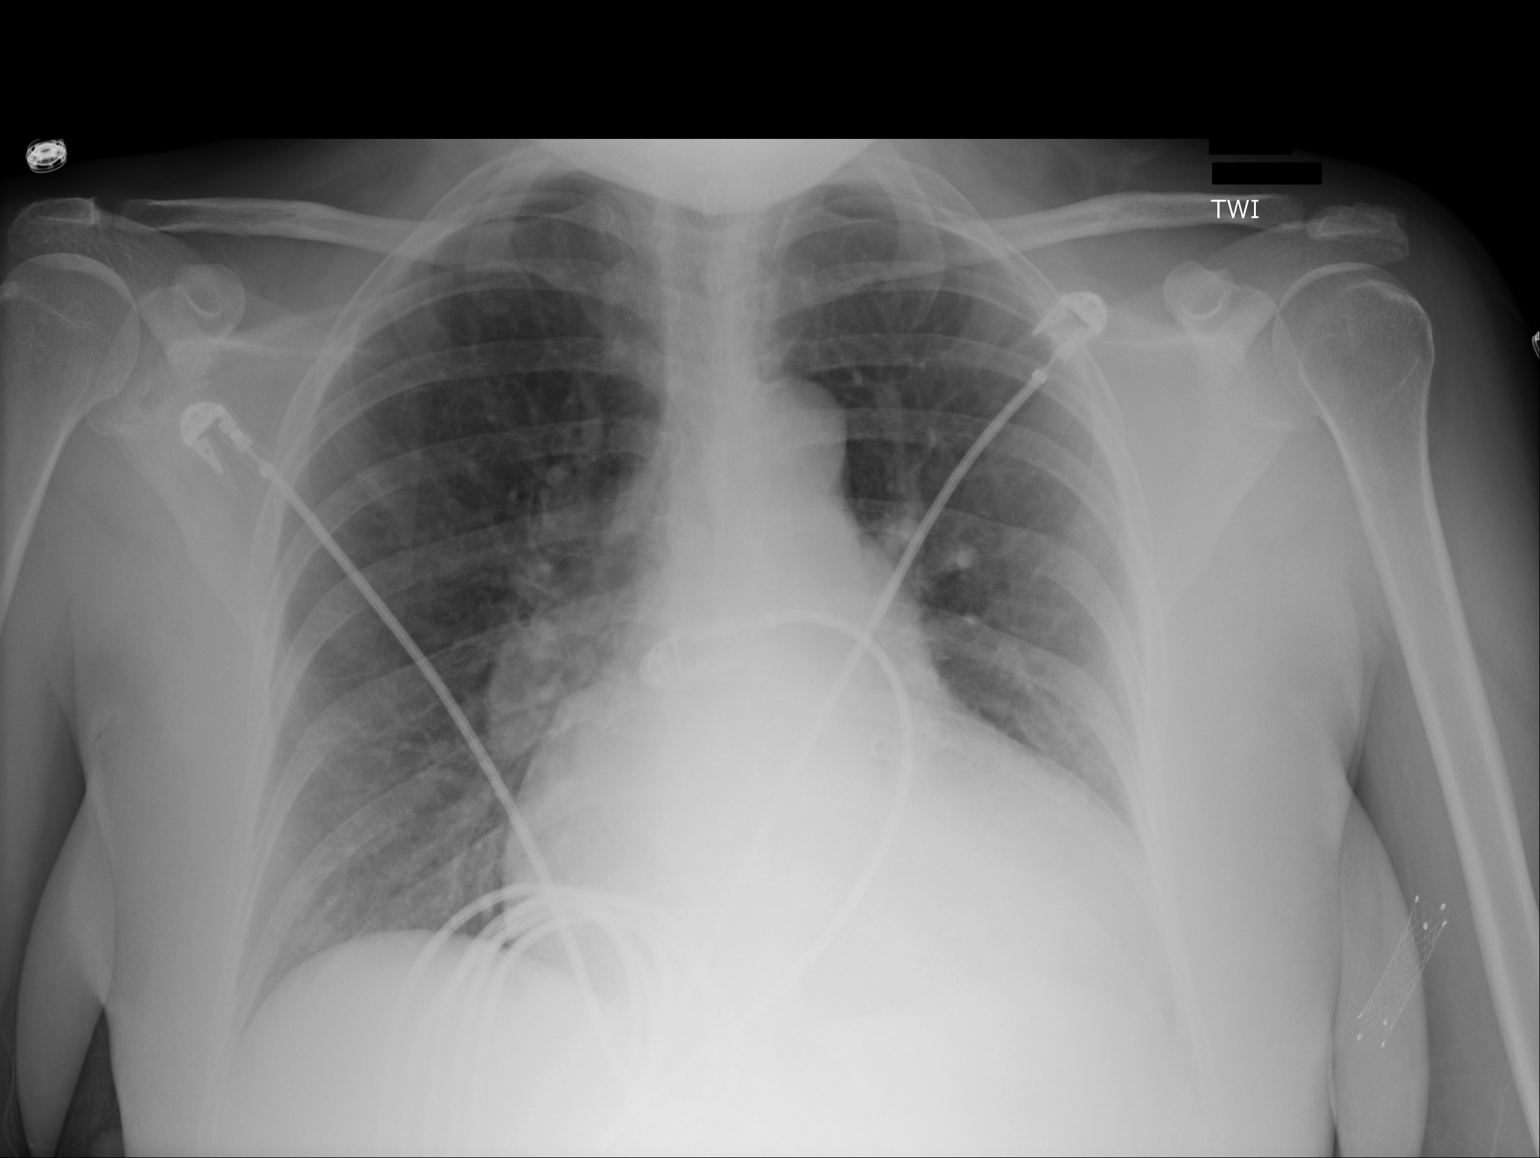

[1 of 1 positions shown; findings below may reference images not displayed]

IMPRESSION: Cardiomegaly.

[REDACTED]

## 2014-09-03 ENCOUNTER — Ambulatory Visit: Payer: Self-pay | Admitting: Vascular Surgery

## 2014-09-24 IMAGING — CT CT CHEST W/O CM
1 series · 15 of 33 positions shown, 19 images · non-contrast
Comparison: none

REASON FOR EXAM: H/O CHF, CARDIOMEGALY ON CXR EVAL EFFUSION
COMMENTS:   May transport without cardiac monitor

PROCEDURE:     CT  - CT CHEST WITHOUT CONTRAST  - October 10, 2012  [DATE]
RESULT:     History: Cardiomegaly. CHF. Effusion.
Comparison Study: Chest x-ray of 10/10/2012.

[Series 2: soft tissue · axial · 0.62mm/px · z∈[-333,-75]mm · 15 of 102 slices shown, 19 images]
[im 8/102  mediastinal]
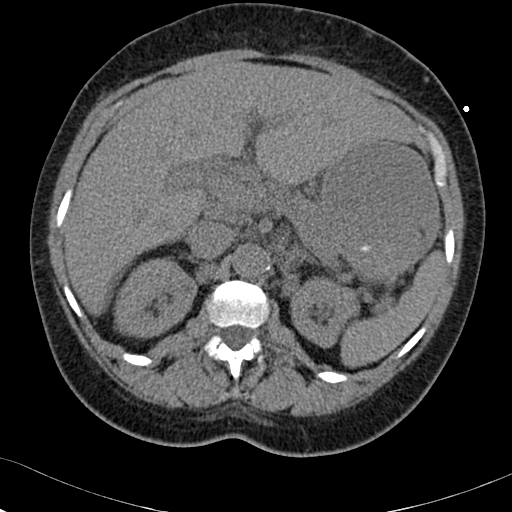
[im 8/102  lung]
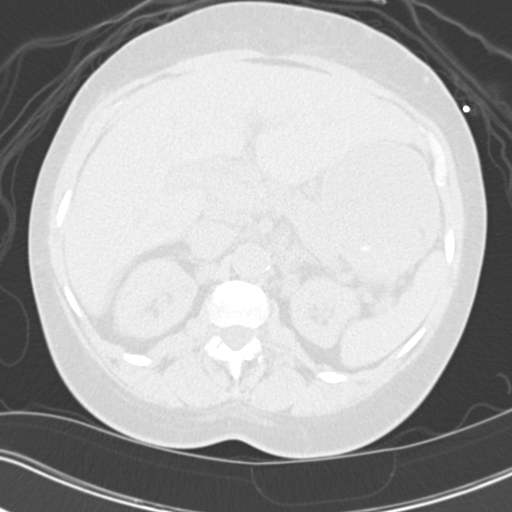
[im 15/102  lung]
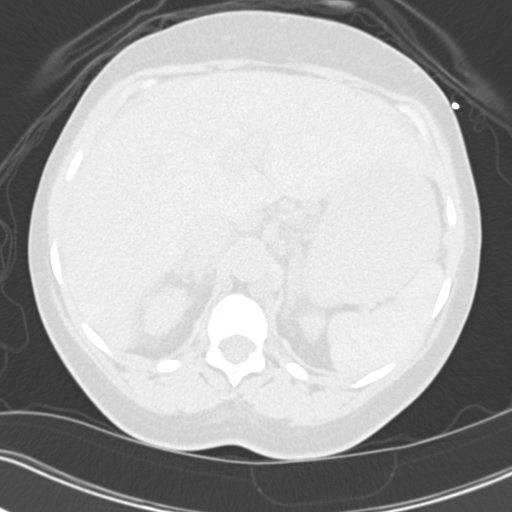
[im 21/102  lung]
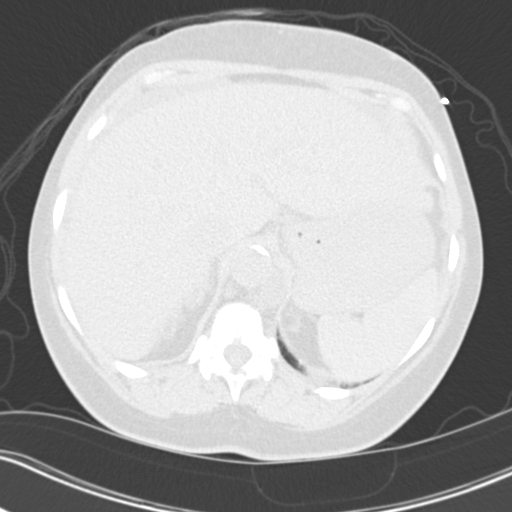
[im 27/102  lung]
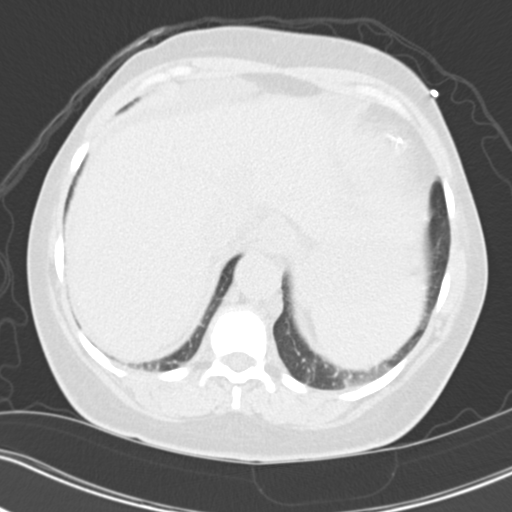
[im 34/102  mediastinal]
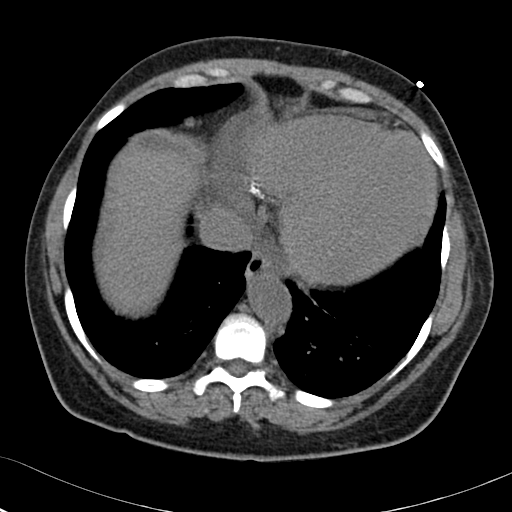
[im 34/102  lung]
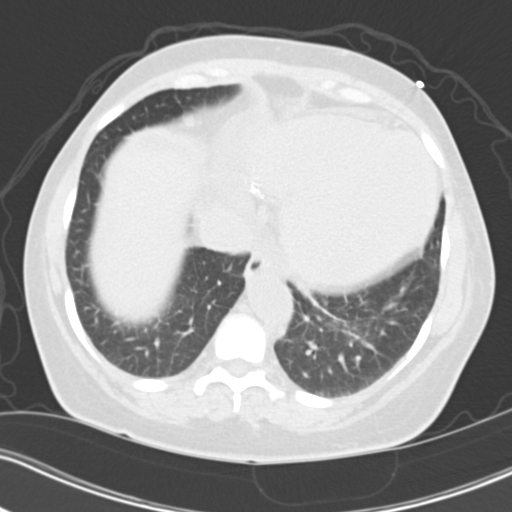
[im 41/102  lung]
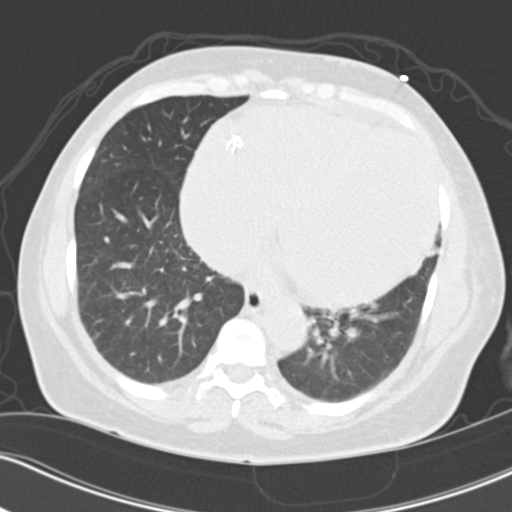
[im 45/102  lung]
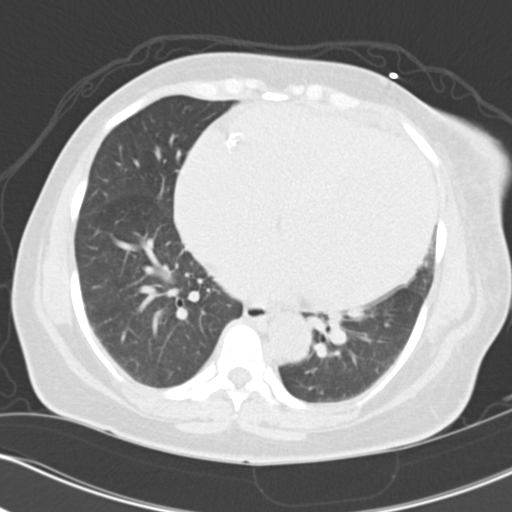
[im 53/102  lung]
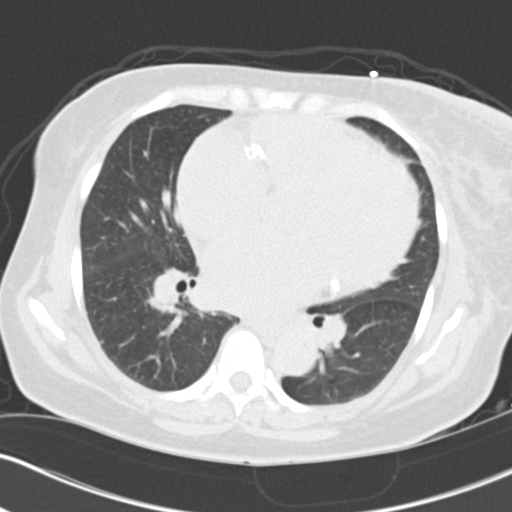
[im 57/102  mediastinal]
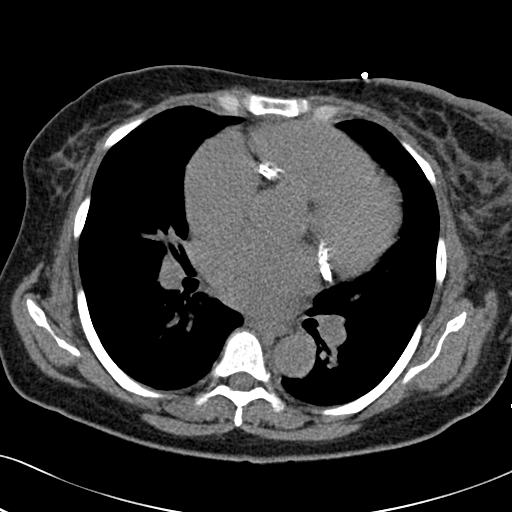
[im 57/102  lung]
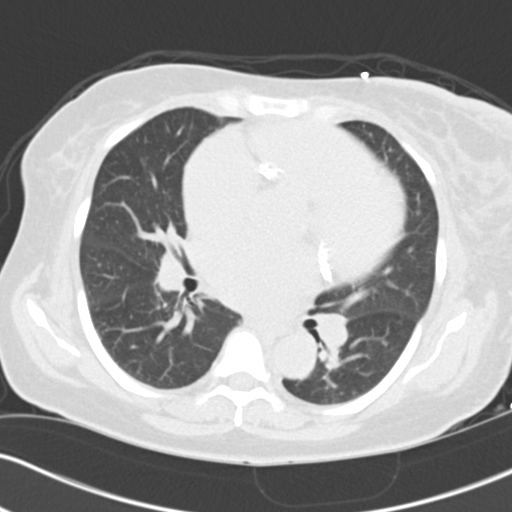
[im 61/102  lung]
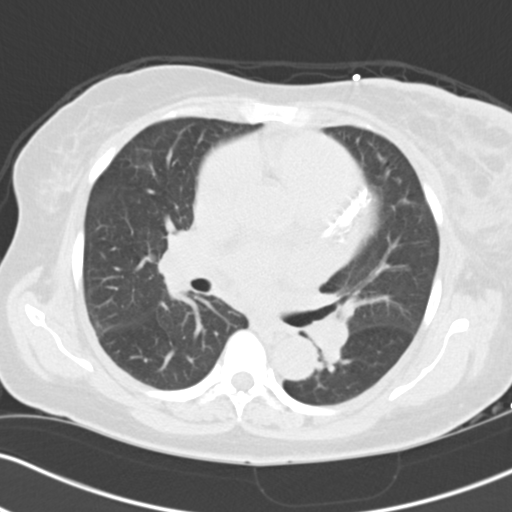
[im 68/102  lung]
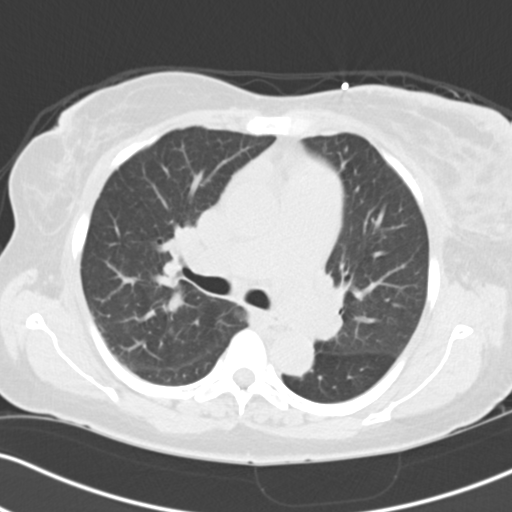
[im 75/102  lung]
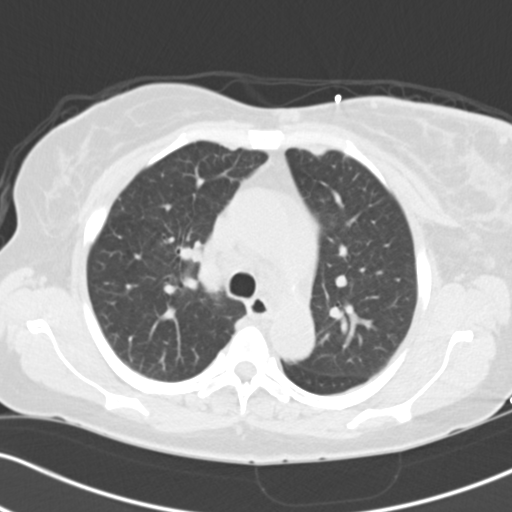
[im 81/102  mediastinal]
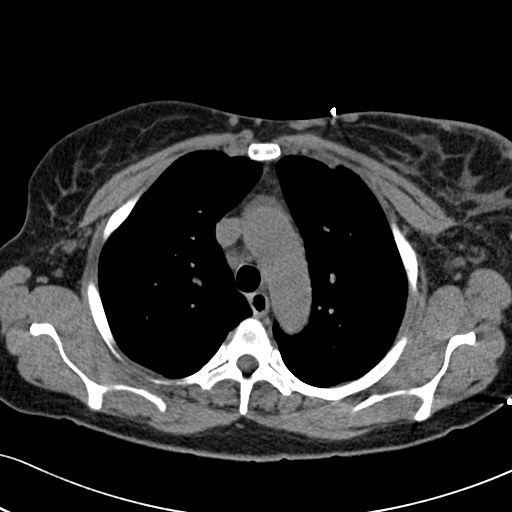
[im 81/102  lung]
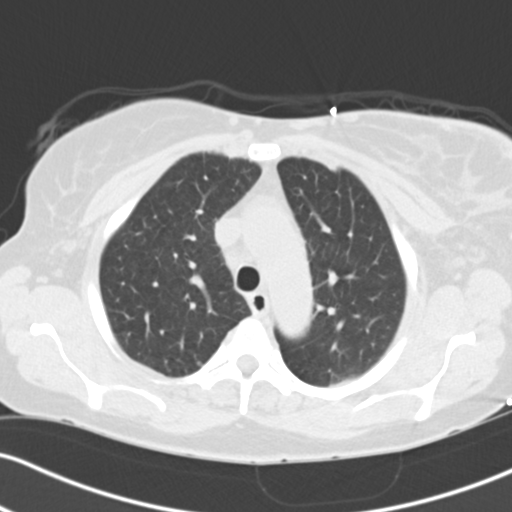
[im 87/102  lung]
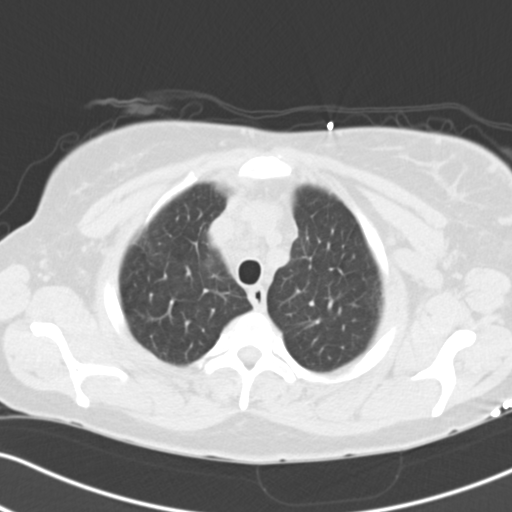
[im 94/102  lung]
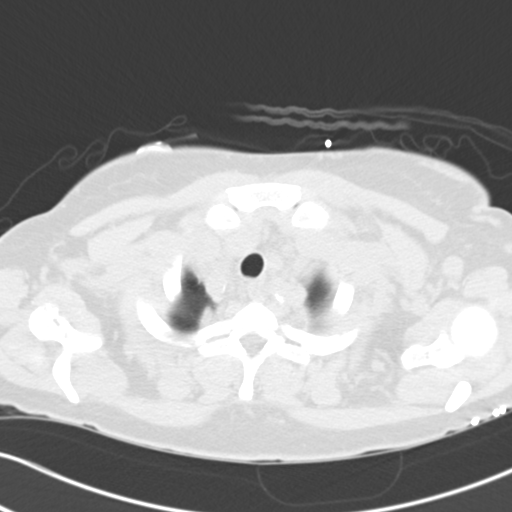

[15 of 33 positions shown; findings below may reference images not displayed]

FINDINGS: Nonenhanced CT obtained.
FINDINGS: Standard nonenhanced CT obtained. Multiple mediastinal lymph nodes
are present. These are moderately prominent in size and followup exam is
needed to. These could be benign or malignant. Coronary artery disease.
Severe cardiomegaly with small pericardial effusion. Aorta normal in
caliber. Adrenals normal. Mild ascites. Large airways patent. Mild
atelectasis lung bases. Mild infiltrate cannot be excluded. No significant
pleural effusion. Skin thickening left breast.
IMPRESSION: 1. Multiple mediastinal lymph nodes. Followup suggested.
2. Severe cardiomegaly. Coronary artery disease. Tiny pericardial effusion.
3. Skin thickening left breast mammography should be considered for further
evaluation as skin thickening can be a sign of malignancy.

## 2014-09-24 IMAGING — CR DG CHEST 2V
1 series · 3 of 3 positions shown · non-contrast
Comparison: none

REASON FOR EXAM: LEGS SWELLING
COMMENTS:   May transport without cardiac monitor

[Series 4: x chest ap · 0.14mm/px · 3 of 3 slices shown]
[im 1/3]
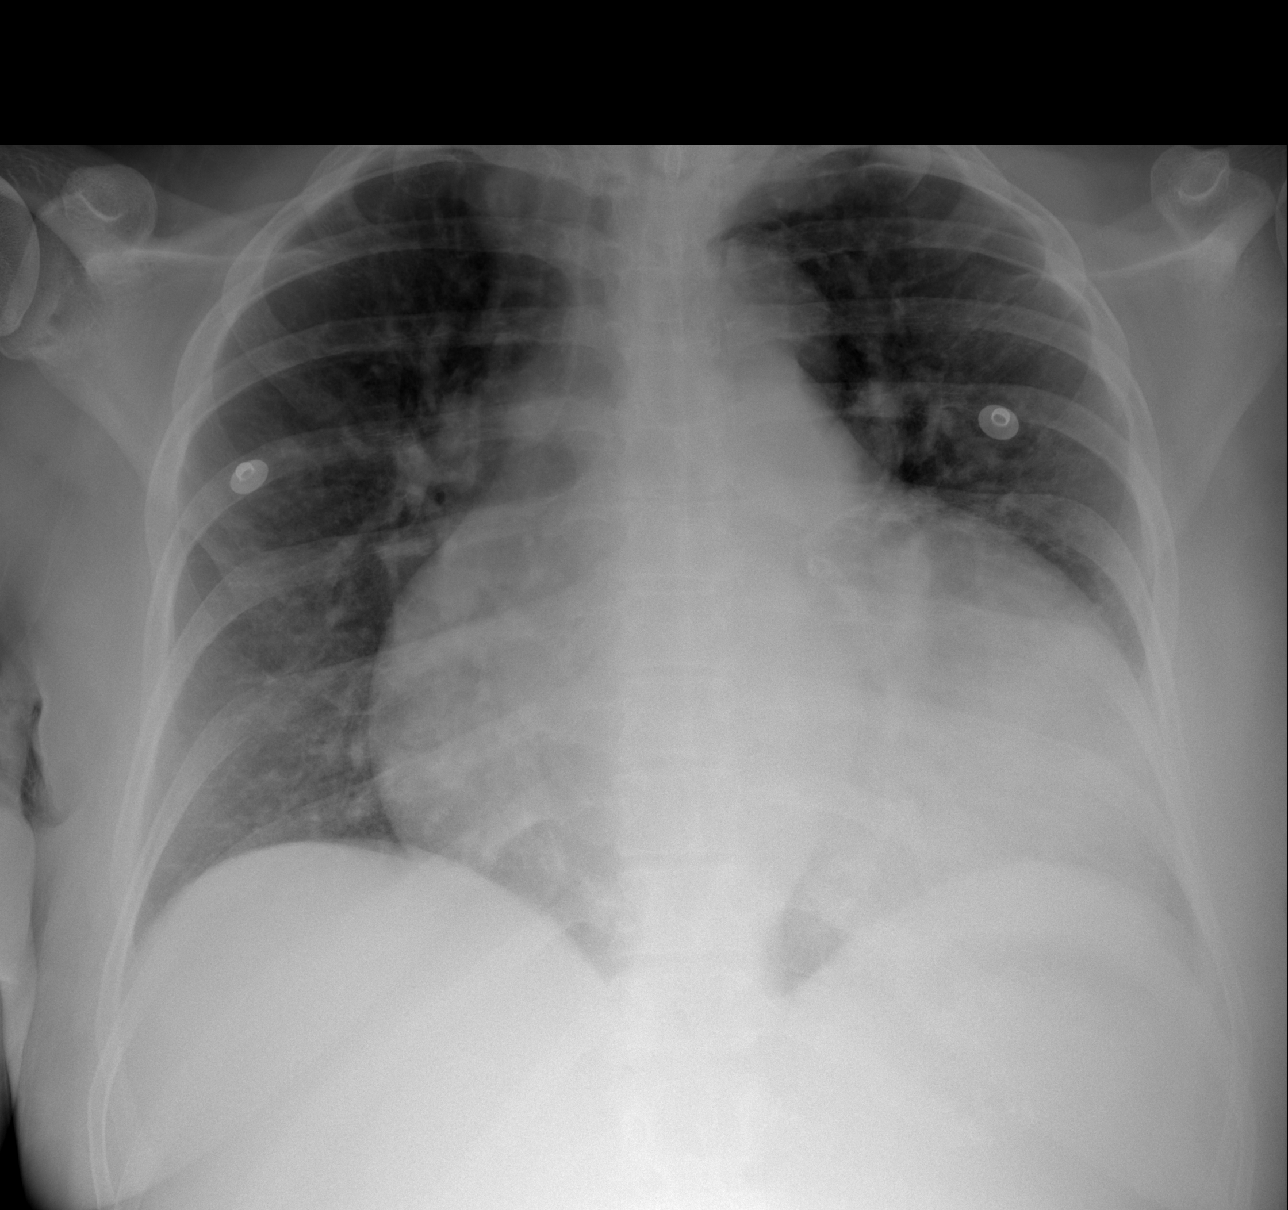
[im 2/3]
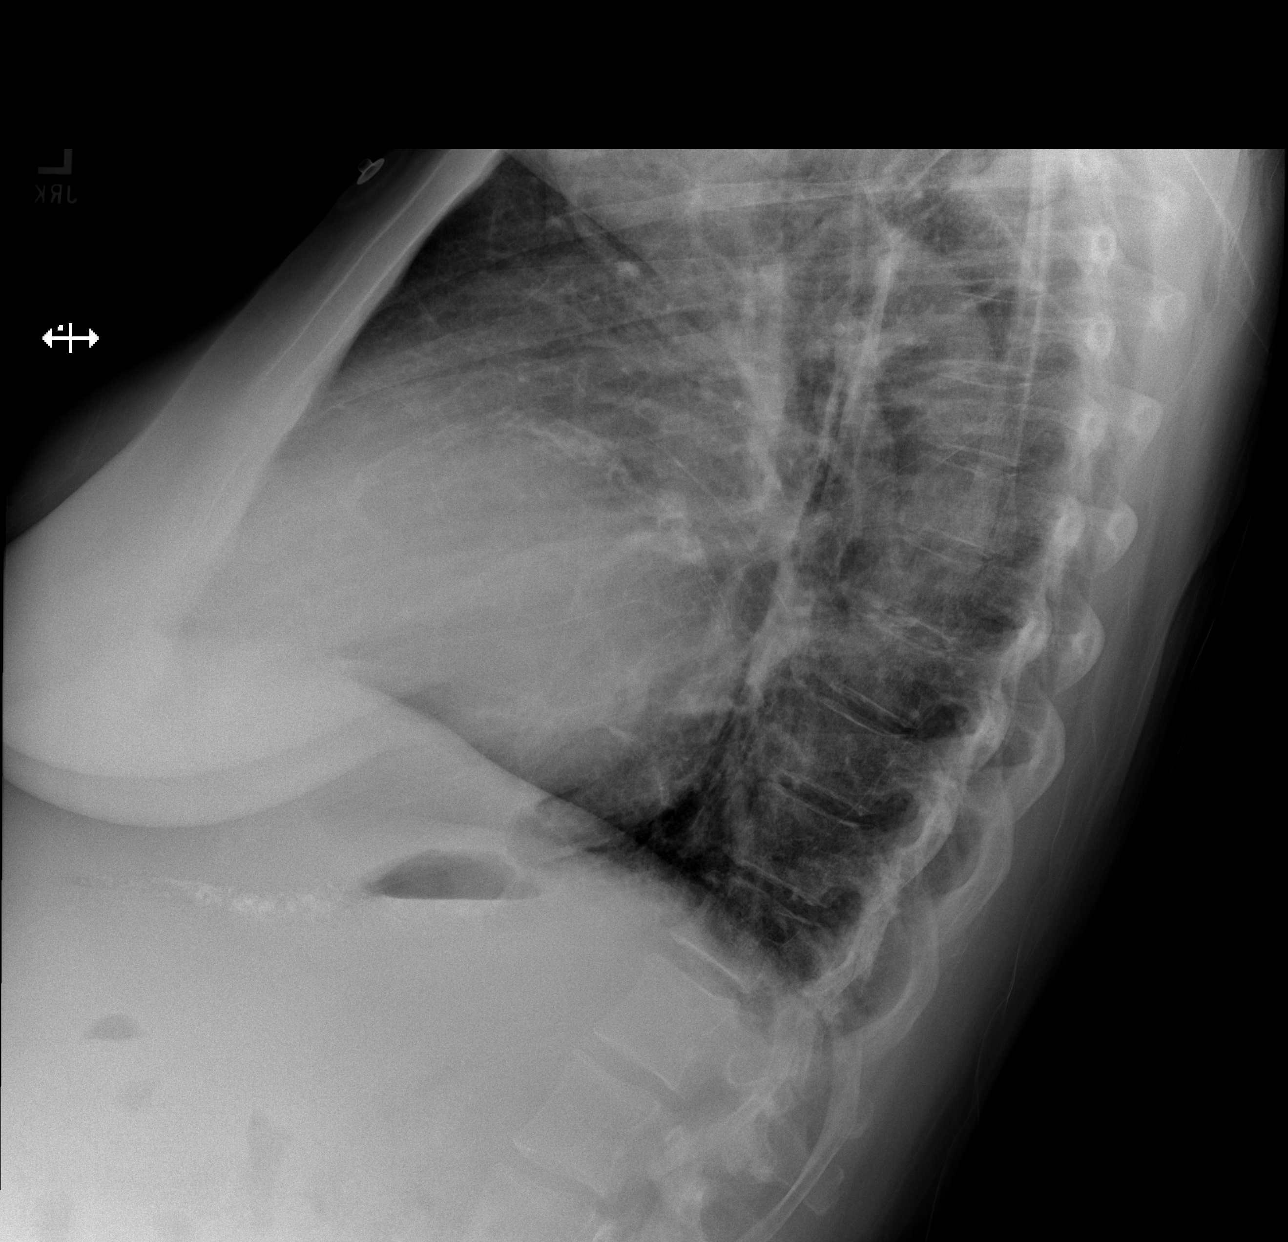
[im 3/3]
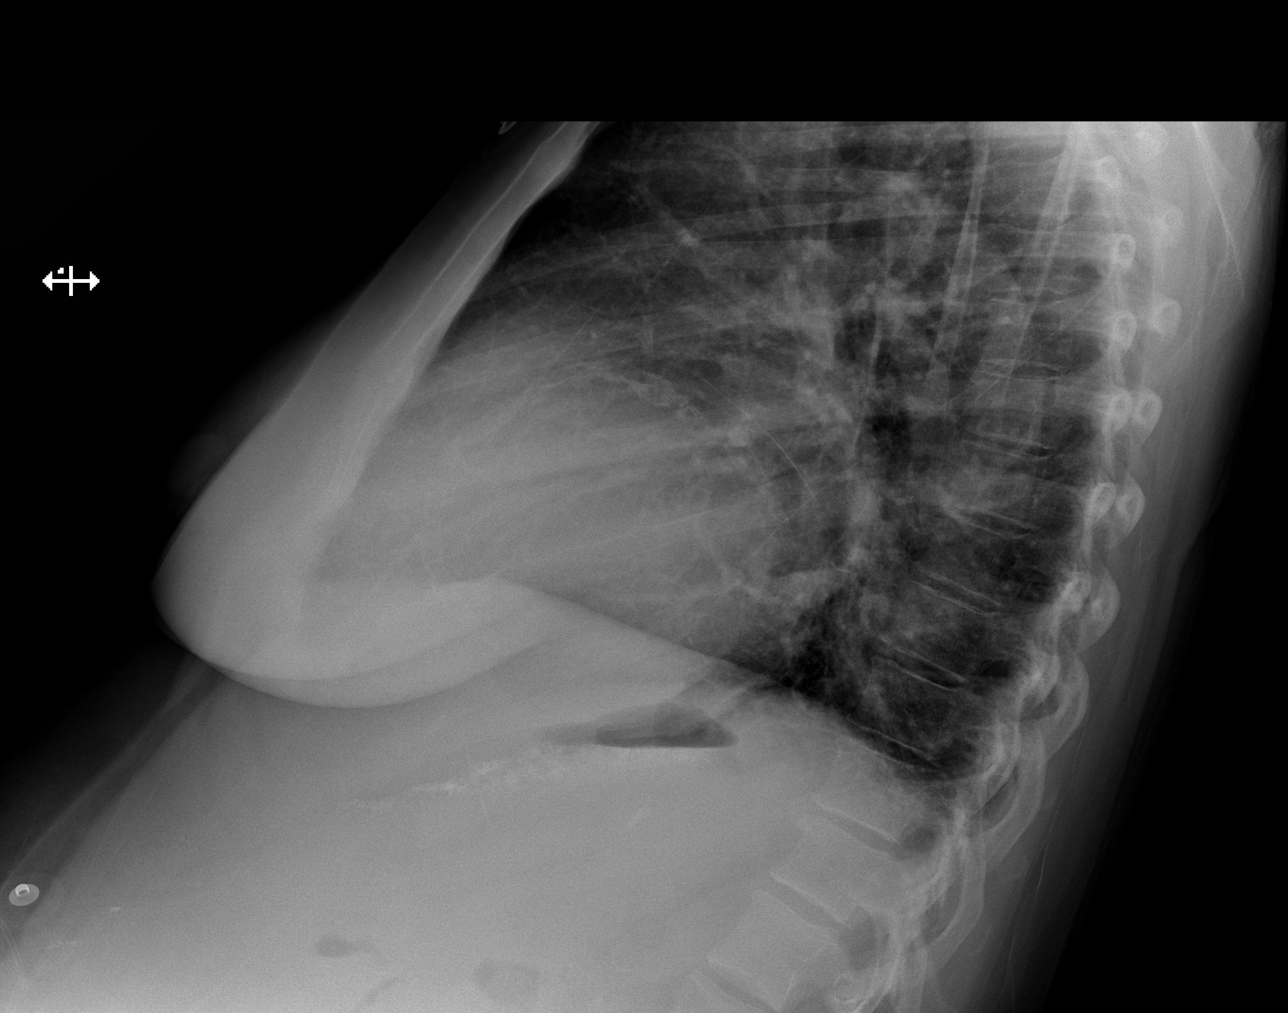

[3 of 3 positions shown; findings below may reference images not displayed]

PROCEDURE:     DXR - DXR CHEST PA (OR AP) AND LATERAL  - October 10, 2012  [DATE]

RESULT:     Comparison is made to the study September 12, 2012.

There is marked enlargement of the cardiac silhouette which may reflect
chamber enlargement or a pericardial effusion. The pulmonary vascularity is
prominent centrally but there is only a mild increase in the interstitial
markings. There is no pleural effusion or alveolar infiltrate.
IMPRESSION: 1. There is marked increase in the size of the cardiac silhouette.
Consideration for performing an echocardiogram would be useful.
2. There are mildly increased interstitial markings suggesting low-grade
CHF.

[REDACTED]

## 2014-10-02 NOTE — H&P (Signed)
PATIENT NAME:  [name], [name] MR#:  [id number] DATE OF BIRTH:  [birth year]  DATE OF ADMISSION:  05/24/2012  PRIMARY CARE PHYSICIAN:  Dr. Nicky Pugh.   CHIEF COMPLAINT: Chest pain today.   HISTORY OF PRESENT ILLNESS: A 54 year old African American female with a history of ESRD on dialysis, coronary artery disease, hypertension, history of cerebrovascular accident who presented to the ED with chest pain before dialysis today. Actually, the patient had chest pain on and off for weeks, but today she developed chest pain again before dialysis, so she was sent to the ED for further evaluation. The patient is alert, awake, and oriented. She said that she had chest pain on the left side, 7/10 at a maximum without radiation. She said that she had chest pain last night and had sweating. She denies any headache, dizziness. No fever or chills. The patient denies any palpitation, orthopnea, or nocturnal dyspnea. No leg edema. The patient was noted to have elevated troponin at 122. She was treated with aspirin the ED. Dr. Clayborn Bigness was called by the ED physician. He will see the patient today.   PAST MEDICAL HISTORY:  1. Hypertension.  2. Coronary artery disease.  3. Cerebrovascular accident.  4. ESRD on dialysis.   PAST SURGICAL HISTORY: Jugular placement on the left arm.   SOCIAL HISTORY: She smokes four cigarettes a day since 54 years old. Denies any alcohol drinking or illicit drugs.   FAMILY HISTORY: Father and mother had hypertension. Grandfather was on dialysis. Brother has hypertension.   REVIEW OF SYSTEMS: CONSTITUTIONAL: The patient denies any fever or chills. No headache or dizziness. No weakness, weight loss or weight gain. EYES: No double vision or blurred vision. ENT: No postnasal drip, slurred speech, or dysphagia. No epistaxis. CARDIOVASCULAR: Positive for chest pain, but no palpitations, no orthopnea, no nocturnal dyspnea. No leg edema. PULMONARY: No cough, sputum, shortness of breath, or  hematemesis. GASTROINTESTINAL: No abdominal pain, nausea, vomiting, or diarrhea. No melena or bloody stool. GENITOURINARY: No dysuria, hematuria, or incontinence. SKIN: No rash or jaundice. NEUROLOGY: No syncope, loss of consciousness or seizure. HEMATOLOGY: No easy bruising or bleeding. PSYCH: No depression, no anxiety.   PHYSICAL EXAMINATION:  VITAL SIGNS: Temperature 97.9, blood pressure 142/84, pulse 74, respirations 26, oxygen saturation 96% on room air.   GENERAL: The patient is alert, awake, oriented, in no acute distress.   HEENT: Pupils round, equal, reactive to light and accommodation. Moist oral mucosa. Clear oropharynx.   NECK: Supple. No JVD or carotid bruits. No lymphadenopathy. No thyromegaly.   CARDIOVASCULAR: S1, S2, regular rate, rhythm. No murmurs or gallops.   PULMONARY: Bilateral air entry. No wheezing or rales. No use of accessory muscles to breathe.   ABDOMEN: Soft. No distention or tenderness. No organomegaly. Bowel sounds present.   SKIN: No rash or jaundice.   EXTREMITIES: No edema, clubbing, or cyanosis. No calf tenderness. Strong bilateral pedal pulses.   NEUROLOGIC: Alert and oriented x3. No focal deficit. Power 5/5. Sensation intact.   LABORATORY, DIAGNOSTIC, AND RADIOLOGICAL DATA: WBC 4.7, hemoglobin 11.4, platelets 193, glucose 68, BUN 39, creatinine 5.84. Electrolytes normal. Troponin 1.22. CK 127, CK-MB 3.1. EKG showed normal sinus rhythm with first degree AV block.   IMPRESSION:  1. Non-ST elevation myocardial infarction. 2. Coronary artery disease.  3. Hypertension.  4. ESRD on dialysis.  5. History of cerebrovascular accident.   PLAN OF TREATMENT:  1. The patient will be admitted to the telemetry floor. We will follow up troponin level and we  will give aspirin, Plavix and start heparin drip. Dr. Clayborn Bigness saw the patient and will give [place] further recommendations.  2. Continue Lopressor, lisinopril to control blood pressure.  3. For ESRD, we will  get a nephrology consult to continue dialysis.  4. We will check lipid panel and TSH.  5. Gastrointestinal prophylaxis.   Discussed the patient's situation and plan of treatment with the patient and the patient's husband.   TIME SPENT: About 62 minutes.   ____________________________ Demetrios Loll, MD qc:ap D: 05/24/2012 15:59:27 ET T: 05/24/2012 16:29:48 ET JOB#: [id number]  cc: Demetrios Loll, MD, <Dictator> Mikeal Hawthorne. Brynda Greathouse, MD Demetrios Loll MD ELECTRONICALLY SIGNED 05/27/2012 17:02

## 2014-10-02 NOTE — H&P (Signed)
PATIENT NAME:  [name], [name] MR#:  [id number] DATE OF BIRTH:  Oct 10, 1960  DATE OF ADMISSION:  05/24/2012  ADDENDUM:   ALLERGIES: Morphine, adhesive.   MEDICATION:  1. Lopressor 50 mg p.o. once a day. 2. Losartan 50 mg p.o. daily.  3. Hydroxyzine 25 mg p.o. tablets, 1 tablet once a day    ____________________________ Demetrios Loll, MD qc:bjt D: 05/24/2012 16:01:36 ET T: 05/24/2012 16:25:05 ET JOB#: [id number]  cc: Demetrios Loll, MD, <Dictator> Demetrios Loll MD ELECTRONICALLY SIGNED 05/25/2012 17:37

## 2014-10-02 NOTE — Discharge Summary (Signed)
PATIENT NAME:  [name], [name] MR#:  [id number] DATE OF BIRTH:  03/16/1961  DATE OF ADMISSION:  05/24/2012 DATE OF DISCHARGE:  05/26/2012  CONSULTANTS:  1. Dr. Clayborn Bigness from Cardiology.  2. Dr. Holley Raring from Nephrology.   CHIEF COMPLAINT: Chest pain.   DISCHARGE DIAGNOSES:  1. Non ST-elevation myocardial infarction, status post catheterization per Cardiology showing multivessel disease, and medical management was recommended.  2. End-stage renal disease, on dialysis.  3. Hypertension.  4. Dysuria, possible urinary tract infection.  5. History of congestive heart failure with ejection fraction of about 30% which is chronic systolic in nature.  6. History of cerebrovascular accident.   DISCHARGE MEDICATIONS: Losartan 50 mg daily, Metoprolol succinate 50 mg daily, hydroxyzine 25 mg 1 tab once a day, isosorbide mononitrate 30 mg extended-release 1 tab daily, Simvastatin 20 mg daily, aspirin 81 mg daily, Cipro 250 mg every 24 hours for 3 days.   DIET: Low sodium renal diet.   ACTIVITY: As tolerated.   FOLLOWUP: Please follow with your primary care physician within 1 to 2 weeks. Please follow with a cardiologist within 1 to 2 weeks.    DISPOSITION: The patient is going home today.   CODE STATUS:  FULL CODE.     HISTORY OF PRESENT ILLNESS: For full details of history and physical, please see the dictation on 05/24/2012 by Dr. Bridgett Larsson, but briefly this is a 54 year old African American female on dialysis, with coronary artery disease, hypertension, history of CVA who presented with chest pain and was noted to have elevated troponin. Of note, the patient is not on aspirin; and per Dr. Clayborn Bigness, who knows the patient, is pretty much noncompliant with medications and followups in the past. The patient was admitted to the hospitalist service, started on heparin drip, aspirin Plavix, beta blocker and ACE inhibitor.   SIGNIFICANT LABS AND IMAGING: Initial BUN 39, creatinine 5.84, chloride 109. Initial  troponin 1.22, then 1.01, then 0.96. CK total and CK-MB fractions were within normal limits. TSH was 0.789. WBC on arrival 4.7. Urinalysis was done December 12th showing no nitrites, trace leukocyte esterase, 6 WBC, 1 RBC. Urine cultures have been sent, and the result is pending. This should be followed as an outpatient, and the patient is aware.   HOSPITAL COURSE: The patient the hospitalist service. Cardiology was consulted. The patient underwent a cardiac catheterization, the result of which is up at this point. The patient's EF is about 30%, patent stents in the RCA and left circumflex. The patient does have multivessel disease, per Cardiology. I discussed the case with Dr. Clayborn Bigness, who recommends medical therapy for now, no intervention, and followup with outpatient Cardiology. The patient has no chest pain. The patient, of note, is not on aspirin, statin as an outpatient, and they were started as well as Imdur. She has no chest pains. She will be discharged with outpatient followup. The patient likely has chronic diastolic congestive heart failure with EF as dictated above. She is on ARB beta blocker at this point. She did have dialysis on the 11th and 12th. She did complain of some dysuria, and urinalysis and urine cultures were sent. It is unclear if this is a urinary tract infection or not, and she was told to follow with her primary care physician. The urine cultures are pending, and she will follow up for this with her primary care physician. She was discharged on 3 days of Cipro.   TOTAL TIME SPENT: 35 minutes.   CODE STATUS:  FULL CODE.  ____________________________ Vivien Presto, MD sa:cbb D: 05/26/2012 16:33:57 ET T: 05/27/2012 11:40:29 ET JOB#: [id number] cc: [name], MD Dwayne D. Clayborn Bigness, MD Vivien Presto MD ELECTRONICALLY SIGNED 06/14/2012 14:08

## 2014-10-02 NOTE — Op Note (Signed)
PATIENT NAME:  [name], CICCARELLI MR#:  [id number] DATE OF BIRTH:  [birth year]  DATE OF PROCEDURE:  01/20/2012  PREOPERATIVE DIAGNOSES:  1. End-stage renal disease.  2. Poorly functioning left arm AV fistula.  3. Hypertension.  4. Stroke.   POSTOPERATIVE DIAGNOSES: 1. End-stage renal disease.  2. Poorly functioning left arm AV fistula.  3. Hypertension.  4. Stroke.   PROCEDURES: 1. Ultrasound guidance for vascular access, left brachial basilic AV fistula.  2. Left upper extremity fistulogram and central venogram.  3. Percutaneous transluminal angioplasty with 8 mm diameter angioplasty balloon for recurrent stenosis within the stent in the basilic vein/brachial vein confluence.   SURGEON: Algernon Huxley, MD   ANESTHESIA: Local with moderate conscious sedation.   ESTIMATED BLOOD LOSS: Minimal.   FLUOROSCOPY TIME: Approximately one minute.   CONTRAST USED: 28 mL.   INDICATION FOR PROCEDURE: This is a 54 year old African American female with long history of end-stage renal disease and multiple other issues. Her AV fistula has become poorly functioning on dialysis and we will plan a fistulogram for further evaluation and potential salvage. Risks and benefits were discussed. Informed consent was obtained.   DESCRIPTION OF PROCEDURE: The patient is brought to the Vascular Interventional Radiology Suite. Left upper extremity was sterilely prepped and draped and a sterile surgical field was created. The fistula was accessed under direct ultrasound guidance a few centimeters beyond the anastomosis with a micropuncture needle. Micropuncture wire and sheath were then placed. Left upper extremity fistulogram was then performed. This demonstrated an approximately 70% stenosis at the distal end of the previously placed stent and approximately 50 to 60% stenosis at the proximal end of the previously placed stent. The remainder of the fistula was patent as was the central venous circulation. The patient  was given 2500 units of intravenous heparin for systemic anticoagulation. This lesion was crossed with a Magic torque wire without difficulty. An 8 mm diameter angioplasty balloon was inflated in the stent just beyond as well as just prior with two inflations performed. Waists were taken in both locations and resolved with angioplasty. Completion fistulogram following this showed marked improvement with 20% or less residual stenosis and good flow through the fistula. At this point I elected to terminate the procedure. The sheath was removed around a 4-0 Monocryl purse-string suture. Pressure was held. Sterile dressing was placed. The patient tolerated the procedure well and was taken to the recovery room in stable condition.    ____________________________ Algernon Huxley, MD jsd:drc D: 01/20/2012 13:38:27 ET T: 01/20/2012 14:20:36 ET JOB#: [id number]  cc: Algernon Huxley, MD, <Dictator> Algernon Huxley MD ELECTRONICALLY SIGNED 01/25/2012 9:03

## 2014-10-02 NOTE — Op Note (Signed)
PATIENT NAME:  [name], PIGNATELLI MR#:  [id number] DATE OF BIRTH:  1960-11-27  DATE OF PROCEDURE:  05/30/2012  PREOPERATIVE DIAGNOSES:  1.  End-stage renal disease.  2.  Poorly functioning left arm arteriovenous fistula.  3.  Hypertension.   POSTOPERATIVE DIAGNOSES: 1.  End-stage renal disease.  2.  Poorly functioning left arm arteriovenous fistula.  3.  Hypertension.   PROCEDURES:  1.  Ultrasound guidance for vascular access, left upper extremity brachial basilic arteriovenous fistula.  2.  Left upper extremity fistulogram and central venogram.  3.  Percutaneous transluminal angioplasty with 8 and 9 mm diameter angioplasty balloon to the basilic vein for stenosis in the 60% to 70% range.   SURGEON: Algernon Huxley, M.D.   ANESTHESIA: Local with moderate conscious sedation.   ESTIMATED BLOOD LOSS: 25 mL.  FLUOROSCOPY TIME: Approximately 2 minutes.   CONTRAST USED: 20 mL.   INDICATION FOR PROCEDURE: This is a lady well-known to [place] for her dialysis access needs. She has a brachiobasilic AV fistula that she has had to have interventions on several occasions previously. She returns with diminished flow and a fistulogram was performed for further evaluation and possible treatment. Risks and benefits were discussed. Informed consent was obtained.   DESCRIPTION OF PROCEDURE: The patient is brought to the vascular interventional radiology suite. The left upper extremity was sterilely prepped and draped, a sterile surgical field was created. The basilic vein was accessed approximately 5 cm beyond the brachial basilic anastomosis with ultrasound guidance, a permanent image was recorded. A micropuncture wire and sheath were placed. Imaging performed through the micropuncture sheath demonstrated approximately 60% to 70% stenosis in the basilic vein near its confluence in the deep venous system in the axillary. This was at the distal end of a previously placed stent. The patient was given heparin and a  6-French sheath was placed. A Magic torque wire was used to cross the lesion without difficulty. The lesion was initially treated with an 8 mm diameter angioplasty balloon with suboptimal result and a 9 mm diameter angioplasty balloon was then inflated. With this inflation, waste was taken, which resolved with angioplasty, and following angioplasty with a 9 mm balloon, there was only an approximately 20% residual stenosis that was not flow limiting. At this point, I elected to terminate the procedure. While the balloon was inflated, it should be noted that imaging was performed to evaluate the arterial side, which was patent. The patient tolerated the procedure well and was taken to the recovery room in stable condition after a 4-0 Monocryl pursestring suture was placed around the sheath and the sheath was removed.  ____________________________ Algernon Huxley, MD jsd:aw D: 05/30/2012 11:38:04 ET T: 05/31/2012 10:56:38 ET JOB#: [id number]  cc: Algernon Huxley, MD, <Dictator> Algernon Huxley MD ELECTRONICALLY SIGNED 06/03/2012 18:35

## 2014-10-05 NOTE — Consult Note (Signed)
Chief Complaint:  Subjective/Chief Complaint Pt states improved sob no cp tolerating dialysis well. No clearly compliant with meds.   VITAL SIGNS/ANCILLARY NOTES: **Vital Signs.:   29-Apr-14 07:44  Vital Signs Type Routine  Temperature Source tympanic  Pulse Pulse 83  Respirations Respirations 18  Systolic BP Systolic BP 505  Diastolic BP (mmHg) Diastolic BP (mmHg) 99  Mean BP 111  Pulse Ox % Pulse Ox % 98  Oxygen Delivery Room Air/ 21 %  *Intake and Output.:   29-Apr-14 04:07  Grand Totals Intake:   Output:      Net:   24 Hr.:  -2760    Daily 07:00  Grand Totals Intake:  240 Output:  3000    Net:  -6979 48 Hr.:  -2760  Oral Intake      In:  240  Dialysis Fluid Removed (ml) ml     Out:  3000  Length of Stay Totals Intake:  240 Output:  3000    Net:  -2760   Brief Assessment:  Cardiac Irregular  -- carotid bruits  + LE edema  + JVD  --Gallop  s3/s4   Gastrointestinal Normal   Gastrointestinal details normal Soft  Nontender  Nondistended  No masses palpable   Lab Results: Routine Chem:  29-Apr-14 04:36   Cholesterol, Serum 70  Triglycerides, Serum 66  HDL (INHOUSE)  32  VLDL Cholesterol Calculated 13  LDL Cholesterol Calculated 25 (Result(s) reported on 11 Oct 2012 at 05:01AM.)  Glucose, Serum  116  BUN  29  Creatinine (comp)  5.83  Sodium, Serum  135  Potassium, Serum 4.5  Chloride, Serum 98  CO2, Serum 31  Calcium (Total), Serum  8.4  Anion Gap  6  Osmolality (calc) 277  eGFR (African American)  9  eGFR (Non-African American)  8 (eGFR values <16m/min/1.73 m2 may be an indication of chronic kidney disease (CKD). Calculated eGFR is useful in patients with stable renal function. The eGFR calculation will not be reliable in acutely ill patients when serum creatinine is changing rapidly. It is not useful in  patients on dialysis. The eGFR calculation may not be applicable to patients at the low and high extremes of body sizes, pregnant women, and  vegetarians.)  Routine Hem:  29-Apr-14 04:36   WBC (CBC) 4.7  RBC (CBC)  3.52  Hemoglobin (CBC)  10.9  Hematocrit (CBC)  33.1  Platelet Count (CBC)  97  MCV 94  MCH 31.0  MCHC 33.0  RDW  14.9  Neutrophil % 45.1  Lymphocyte % 34.9  Monocyte % 14.1  Eosinophil % 5.0  Basophil % 0.9  Neutrophil # 2.1  Lymphocyte # 1.6  Monocyte # 0.7  Eosinophil # 0.2  Basophil # 0.0 (Result(s) reported on 11 Oct 2012 at 05:00AM.)   Radiology Results: XRay:    28-Apr-14 01:42, Chest PA and Lateral  Chest PA and Lateral   REASON FOR EXAM:    LEGS SWELLING  COMMENTS:   May transport without cardiac monitor    PROCEDURE: DXR - DXR CHEST PA (OR AP) AND LATERAL  - Oct 10 2012  1:42AM     RESULT: Comparison is made to the study of September 12, 2012.    There is marked enlargement of the cardiac silhouette which may reflect   chamber enlargement or a pericardial effusion. The pulmonary vascularity   is prominent centrally but there is only a mild increase in the   interstitial markings. There is no pleural effusion or alveolar  infiltrate.    IMPRESSION:   1. There is marked increase in the size of the cardiac silhouette.     Consideration for performing an echocardiogram would be useful.  2. There are mildly increased interstitial markings suggesting low-grade   CHF.    Dictation Site: 2        Verified By: DAVID A. Martinique, M.D., MD  Cardiology:    27-Apr-14 23:51, ECG  Ventricular Rate 84  Atrial Rate 84  P-R Interval 184  QRS Duration 96  QT 396  QTc 467  P Axis 63  R Axis 18  T Axis 27  ECG interpretation   Sinus rhythm with occasional Premature ventricular complexes  Biatrial enlargement  Cannot rule out Anterior infarct (cited on or before 11-Sep-2012)  Abnormal ECG  When compared with ECG of 11-Sep-2012 08:39,  Premature ventricular complexes are nowPresent  ----------unconfirmed----------  Confirmed by OVERREAD, NOT ([id number]), editor PEARSON, BARBARA ([id number]) on 10/11/2012  9:50:09 AM  ECG     28-Apr-14 02:46, ECG  Ventricular Rate 86  Atrial Rate 86  P-R Interval 190  QRS Duration 94  QT 392  QTc 469  P Axis 73  R Axis 39  T Axis 88  ECG interpretation   Normal sinus rhythm  Left atrial enlargement  Nonspecific T wave abnormality  Prolonged QT  Abnormal ECG  When compared with ECG of 09-Oct-2012 23:51,  T wave amplitude has increased in Anterior leads  ----------unconfirmed----------  Confirmed by OVERREAD, NOT ([id number]), editor PEARSON, BARBARA ([id number]) on 10/11/2012 9:50:24 AM  ECG     29-Apr-14 06:49, ECG  Ventricular Rate 77  Atrial Rate 77  P-R Interval 206  QRS Duration 98  QT 424  QTc 479  P Axis 62  R Axis 59  T Axis 78  ECG interpretation   Normal sinus rhythm  Possible Left atrial enlargement  Nonspecific T wave abnormality  Prolonged QT  Abnormal ECG  When compared with ECG of 10-Oct-2012 02:46,  No significant change was found      Confirmed by PARASCHOS, ALEX ([id number]) on 10/13/2012 1:28:41 PM    Overreader: PARASCHOS, ALEX  ECG   CT:    28-Apr-14 03:38, CT Chest Without Contrast  CT Chest Without Contrast   REASON FOR EXAM:    H/O CHF, CARDIOMEGALY ON CXR EVAL EFFUSION  COMMENTS:   May transport without cardiac monitor    PROCEDURE: CT  - CT CHEST WITHOUT CONTRAST  - Oct 10 2012  3:38AM     RESULT: History: Cardiomegaly. CHF. Effusion.    Comparison Study: Chest x-ray of 10/10/2012.    Findings: Nonenhanced CT obtained.    Findings: Standard nonenhanced CT obtained. Multiple mediastinal lymph   nodes are present. These are moderately prominent in size and followup   exam is needed to. These could be benign or malignant. Coronary artery   disease. Severe cardiomegaly with small pericardial effusion. Aorta     normal in caliber. Adrenals normal. Mild ascites. Large airways patent.   Mild atelectasis lung bases. Mild infiltrate cannot be excluded. No   significant pleural effusion. Skin thickening left  breast.    IMPRESSION:   1. Multiple mediastinal lymph nodes. Followup suggested.  2. Severe cardiomegaly. Coronary artery disease. Tiny pericardial   effusion.  3. Skin thickening left breast mammography should be considered for   further evaluation as skin thickening can be a sign of malignancy.        Verified By: Osa Craver, M.D., MD  Assessment/Plan:  Assessment/Plan:  Assessment IMP CHF CM HTN ESRD Smoking CAD Hx MI Hyperlipidemia Elevated troponin/Demand ischemia CRF .   Plan PLAN Continue dialysis for ESRD Continue meds for CHF Agree with agressive medical therapy for CM Advise to quit smoking S/P MI in the past no worsing CP Increase activity Advise on compliance Doubt MI now probably secondary ESRD Ok to d/c hpome f/u cardiology as outpt   Electronic Signatures: Lujean Amel D (MD)  (Signed 20-May-14 10:09)  Authored: Chief Complaint, VITAL SIGNS/ANCILLARY NOTES, Brief Assessment, Lab Results, Radiology Results, Assessment/Plan   Last Updated: 20-May-14 10:09 by Lujean Amel D (MD)

## 2014-10-05 NOTE — Discharge Summary (Signed)
PATIENT NAME:  [name], [name] MR#:  [id number] DATE OF BIRTH:  Jan 20, 1961  For a detailed note, please take a look at the history and physical done on admission by Dr. Leslye Peer.   DIAGNOSES AT DISCHARGE:  1.  Acute hyperkalemia.  2.  End-stage renal disease on hemodialysis.  3.  Elevated troponin likely in the setting of demand ischemia.  4.  Ongoing tobacco abuse.  5.  Hyperlipidemia.  6.  Chronic dysphagia secondary to reflux esophagitis.  7.  History of cardiomyopathy, ejection fraction of 25% to 30%.   DIET: Low sodium, low fat diet.   ACTIVITY: As tolerated.   FOLLOW-UP: Dr. Brynda Greathouse in the next 1 to 2 weeks.    DISCHARGE MEDICATIONS: Losartan 50 mg daily, Imdur 30 mg daily, simvastatin 20 mg daily, Toprol 50 mg daily, Ranexa 500 mg b.i.d., aspirin 81 mg daily, hydroxyzine 25 mg q.8 hours as needed, and Protonix 40 mg daily.   CONSULTANTS DURING HOSPITAL COURSE: Dr. Eddie Dibbles Oh from gastroenterology, Dr. Jordan Hawks from cardiology, Dr. Clayborn Bigness with Dr. Glean Salen from nephrology.   PERTINENT STUDIES DONE DURING THE HOSPITAL COURSE: Chest x-ray done on admission showing severe cardiomegaly. No evidence of CHF or pneumonia.   HOSPITAL COURSE: This is a 54 year old female with medical problems as mentioned above, who presented to the hospital secondary to chest pain and also noted to have acute hyperkalemia with a potassium of 6.1.   1.  Chest pain. The patient presented with chest pain and noted to have a mildly elevated troponin. Initially there was some concern that she may have a non-ST elevation MI. Although a cardiology consult was obtained, the patient was seen by Dr. Ubaldo Glassing, who did not think that the patient's elevated troponin was related to acute coronary syndrome but likely related to demand ischemia and also poor renal clearance from her end-stage renal disease. He did not want to pursue any further cardiac intervention at this time. The patient was maintained on aspirin, Ranexa,  beta blocker and statin and Imdur, and she will resume that.  2.  Acute hyperkalemia. The patient presented to the hospital with a potassium of 6.1. She was urgently dialyzed. Her potassium after dialysis had normalized and is currently stable. Potassium on the day of discharge is 5.2.  3.  End-stage renal disease on hemodialysis. The patient is on Tuesday, Thursday, Saturday scheduled dialysis. She will resume that. She was followed by nephrology. She received urgent dialysis on the day she presented to the hospital with acute hyperkalemia, and for now, she will resume her schedule as mentioned above.  4.  Dysphagia. The patient apparently has been having ongoing dysphagia now for years. She apparently has dysphagia with solids and liquids. There was some concern that maybe her chest pain could be related to possible esophageal stricture or gastroesophageal reflux disease. A GI consult was obtained. The patient underwent upper GI endoscopy on September 5 which showed grade C reflux esophagitis which was biopsied and dilated. For now as per GI, she is to be discharged on Protonix which she has been given a prescription for. If she continues to have further dysphagia, she is to follow up with GI as an outpatient.  5.  Hyperlipidemia. The patient was maintained on her simvastatin. She will resume that.  6.  Hypertension. The patient remained hemodynamically stable. She will continue her losartan, Imdur and Toprol as stated.  7.  History of cardiomyopathy, ejection fraction of 25% to 30%. Clinically the patient did not have  any evidence of congestive heart failure. She will continue her cardiac meds including her Ranexa, metoprolol, statin and Imdur and losartan as stated.   CODE STATUS: FULL CODE.   TIME SPENT: 45 minutes.    ____________________________ Belia Heman. Verdell Carmine, MD vjs:np D: 02/17/2013 16:37:48 ET T: 02/17/2013 19:44:51 ET JOB#: [id number]  cc: Belia Heman. Verdell Carmine, MD, <Dictator> Mikeal Hawthorne.  Brynda Greathouse, MD Henreitta Leber MD ELECTRONICALLY SIGNED 02/25/2013 15:11

## 2014-10-05 NOTE — Consult Note (Signed)
PATIENT NAME:  [name], [name] MR#:  [id number] DATE OF BIRTH:  1961-03-29  DATE OF CONSULTATION:  05/11/2013  REFERRING PHYSICIAN:   CONSULTING PHYSICIAN:  Leotis Pain, MD  REASON FOR NEUROLOGICAL EVALUATION: Left-sided weakness.   HISTORY OF PRESENTING ILLNESS: A 54 year old African American female with frequent admissions to [facility], most recent ones are for chest pain that were ruled out for any cardiac ischemia. She has a history of end-stage renal disease on hemodialysis Tuesday, Thursday, and Saturday, history of hypertension, and coronary artery disease admitted to the Emergency Department with bleeding fistula. Bleeding started from her AV fistula that is on the left side about a day prior to admission which self resolved. The following day, the patient had another bleeding episode at which time she presented to the Emergency Department. By the time she presented in the emergency department the bleeding had stopped again.  The patient was going to be discharged but then of note, on examination, the patient was noted to be weaker on her left upper extremity, slightly in the left lower extremity, and that is the reason for neurological evaluation. The patient is status post CAT scan evaluation that did not show any acute intracranial abnormalities. The patient has chronic bilateral strokes. On further history, the patient states that she is weaker on the left upper extremity, but she states that it has been going on for at least 2 months and it is progressively getting worse. The patient does state that she fell about 3 weeks ago and she did hit her left foot and she has torn some ligaments in her left leg, which she is being followed up as an outpatient for. Currently no complaints as the patient was to be discharged.   PAST MEDICAL HISTORY: Coronary artery disease and endstage renal disease, on dialysis.   ALLERGIES: She is allergic to MORPHINE AND ADHESIVE TAPE AS  WELL AS PLAVIX.  PSYCHOSOCIAL: The patient lives at home and she is disabled. She is a pack smoker which lasts for about 2 weeks.   FAMILY HISTORY: Father has history of hypertension and prostate cancer.   HOME MEDICATIONS: Include simvastatin, sevelamer, pantoprazole, metoprolol, lisinopril, and aspirin.   REVIEW OF SYSTEMS:  CONSTITUTIONAL: Denies any fever or fatigue.  EYES: No visual changes. No blurred vision.  EAR, NOSE, THROAT: No epistaxis or discharge.  CARDIOVASCULAR: Denies any chest pain.  GASTROINTESTINAL: Denies any nausea or vomiting.  GENITOURINARY: No dysuria or hematuria.  MUSCULOSKELETAL: No joint effusion. A chronic left side weakness, which apparently is worse.  PSYCHIATRIC: No anxiety or depression.  ENDOCRINE: No heat or cold intolerance.   LABORATORY AND DIAGNOSTICS: Glucose 84, BUN 29, creatinine 6.13, sodium slightly low at 130, potassium 5.6, and chloride 97. White blood cell count 5.3, hemoglobin 11, and hematocrit is 33.  On imaging, she had bilateral chronic infarcts. No acute intracranial abnormality.   PHYSICAL EXAMINATION: VITAL SIGNS: Temperature 97.5, pulse 59, respirations 18, blood pressure 104/71, and pulse ox 97% on room air.  NEUROLOGIC: The patient is alert, awake, and oriented to time, place, location, and the reason why she is in the hospital. Able to count the days of the week backwards. Cranial nerve examination: Extraocular movements are intact. Facial sensation appears to be intact. Uvula appears to be midline. Pupils 3 mm to 2 mm, reactive bilaterally. Visual fields are intact bilaterally. Shoulder shrug intact. On motor strength examination, the patient appears to have a drift on the left upper extremity. Most of the weakness  is in the left triceps muscle and then the left biceps is about 4 out of 5, but that examination changes when asking the patient to repeat the same movement and she appears to get stronger. There is slight weakness in the  left lower extremity, which is 4+/5, mostly at plantar dorsiflexion, but the patient does state that she is status post fall about 3 weeks ago. Coordination: Finger-to-nose intact. Gait no signs of ataxia. The patient was able to ambulate on her own.  Reflexes 1+, symmetrical.   IMPRESSION: A 54 year old female with endstage renal disease on hemodialysis, coronary artery disease, and multiple recent admissions to [facility] who presents with a bleeding fistula at times today which self resolved, but on examination found to have left upper/left lower extremity weakness. On further questioning, the patient states that the weakness has been progressing over the past 2 months. She is status post fall about 3 weeks ago with some ligamentous injury in the left lower extremity. On neurological evaluation, there is a lot of giveaway weakness, specifically in the left upper extremity and specifically in the left triceps, which improves with further testing.   PLAN: At this point, would not recommend any further imaging. Do not think this is acute ischemia since it has been progressive over a significant period of time. Would obtain imaging. First a MRI of C-spine to make sure that is not the cause for her upper extremity weakness. Restart the patient's aspirin when possible. Continue the patient's statin. The case was discussed with the patient's primary doctor, Dr. Tressia Miners. The patient wants to be discharged. I believe from a neurological standpoint she can be discharged today. Thank you. It was a pleasure seeing this patient. Please call with any questions.  ____________________________ Leotis Pain, MD yz:sb D: 05/11/2013 13:51:09 ET T: 05/11/2013 14:11:56 ET JOB#: [id number]  cc: Leotis Pain, MD, <Dictator> Leotis Pain MD ELECTRONICALLY SIGNED 05/14/2013 13:19

## 2014-10-05 NOTE — Op Note (Signed)
PATIENT NAME:  [name], MENCER MR#:  [id number] DATE OF BIRTH:  June 25, 1960  DATE OF PROCEDURE:  01/30/2013  PREOPERATIVE DIAGNOSES: 1.  End-stage renal disease.  2.  Poorly functioning left arm arteriovenous fistula.  3.  Hepatitis C.  4.  Hypertension.  5.  History of stroke.  6.  History of coronary artery disease.   POSTOPERATIVE DIAGNOSES:  1.  End-stage renal disease.  2.  Poorly functioning left arm arteriovenous fistula.  3.  Hepatitis C.  4.  Hypertension.  5.  History of stroke.  6.  History of coronary artery disease.   PROCEDURES: 1.  Ultrasound guidance for vascular access to left arm arteriovenous fistula.  2.  Left upper extremity fistulogram and central venogram.  3.  Percutaneous transluminal angioplasty of left innominate superior vena cava with 10 and 12 mm diameter angioplasty balloons.   SURGEON: Algernon Huxley, M.D.   ANESTHESIA: Local with moderate conscious sedation.   ESTIMATED BLOOD LOSS: Minimal.   INDICATION FOR PROCEDURE: This is a 54 year old African American female with end-stage renal disease. She has a long-standing left brachial basilic AV fistula which has required multiple interventions previously. She has been referred back for increasing pressures and diminished flow and we are asked to evaluate her fistula. Risks and benefits were discussed. Informed consent was obtained.   DESCRIPTION OF PROCEDURE: The patient was brought to the vascular and interventional radiology suite. The left upper extremity was sterilely prepped and draped and a sterile surgical field was created. Due to her combative nature, she was sedated with Precedex and we had to give additional sedation in the form of Versed as well. Despite this she was non-cooperative and moving throughout the procedure limiting our imaging and we had to re-prep and drape her left arm before beginning the procedure due to her breaking the sterile field. Once we reprepped and draped, the fistula  was anesthetized and accessed under direct ultrasound guidance without difficulty with a micropuncture needle and micropuncture wire and sheath were then placed. Imaging was performed through the micropuncture sheath and showed the stents in the basilic vein and to the axillary vein were patent as were the axillary and subclavian vein. The left innominate vein down to the superior vena cava had a high-grade stenosis which was surprising as she has not had a catheter in quite some time, but there were very engorged collaterals in the jugular venous system secondary to this stenosis, which was clear. We up-sized to a 6-French sheath and heparinized the patient. With compression of the fistula, we were able to opacify the anastomotic area, in the arterial portion of the fistula, which were patent. The stenosis was able to be crossed with a Kumpe catheter and a Magic torque wire, although the wire did not pass easily initially. The wire was parked into the inferior vena cava, and the diagnostic catheter was removed. Initially a 10 mm diameter angioplasty balloon was inflated with a waste taken. However, there was still residual stenosis after inflation with a 10 mm balloon, and I up-sized to a 12 mm diameter angioplasty balloon. With this the residual stenosis appeared to be less than 30%, the collaterals were markedly less distended and there was much more brisk flow through the central venous circulation. At this point, I elected to terminate the procedure. The sheath was removed around a 4-0 Monocryl pursestring suture, pressure was held and a sterile dressing was placed. The patient tolerated the procedure well and was taken to the recovery room  in stable condition.  ____________________________ Algernon Huxley, MD jsd:sb D: 01/30/2013 11:42:55 ET T: 01/30/2013 12:09:46 ET JOB#: [id number]  cc: Algernon Huxley, MD, <Dictator> Algernon Huxley MD ELECTRONICALLY SIGNED 01/30/2013 16:09

## 2014-10-05 NOTE — Consult Note (Signed)
Brief Consult Note: Diagnosis: Vascular dementia, Delirium secondary to medical condition-resolved.   Patient was seen by consultant.   Consult note dictated.   Recommend further assessment or treatment.   Comments: [name] has no psychiatric history. She suffered a stroke in 2011 that left her with a slight speech impediment. She denies any problems. At the time of admission, she was reportedly confused at times.   PLAN: 1. The patient denies any symptoms of depression, anxiety or psychosis. She declines any treatment.   2. Confusion has resolved per patient and her family.  3. The patient may experience disinhibition and impaired judgment from old frontal lobe stroke. This type of symptoms is difficult to treat especially in an unwiling patient.   4. I will sign off.  Electronic Signatures: Orson Slick (MD)  (Signed 01-Apr-14 17:13)  Authored: Brief Consult Note   Last Updated: 01-Apr-14 17:13 by Orson Slick (MD)

## 2014-10-05 NOTE — H&P (Signed)
PATIENT NAME:  [name], [name] MR#:  [id number] DATE OF BIRTH:  1960-10-22  DATE OF ADMISSION:  05/11/2013  PRIMARY CARE PHYSICIAN:  Dr. Nicky Pugh.  REFERRING PHYSICIAN:  Dr. Beather Arbour.   CHIEF COMPLAINT:  Bleeding from the AV fistula.   HISTORY OF PRESENT ILLNESS:  The patient is a 54 year old African American female with a past medical history of end-stage renal disease on hemodialysis on Tuesday, Thursday and Saturday, hypertension and coronary artery disease came into the ER last night with a chief complaint of bleeding from the AV fistula site.  The patient is reporting that she had her dialysis yesterday and after she went home in the afternoon she started bleeding from the AV fistula.  The patient applied some pressure and eventually the bleeding was controlled as reported by the patient, but last night again she started bleeding with no control and she came into the ER.  By the time she came into the ER the bleeding was controlled, but eventually the ER physician has noted that her chronic left-sided weakness has gotten worse.  The patient also reported some vague speech difficulties.  As there was a concern for subacute CVA a CAT scan of the head was done which was negative.  Hospitalist team is called to admit the patient to rule out stroke.  During my examination, the patient denies any headache or blurry vision, but she is still reporting that she is feeling more weaker on the left hand side.  Some vague speech difficulties, but denies any worsening of her chronic difficulty in swallowing.  The patient has old history of stroke and she has chronic left-sided weakness and chronic dysphagia.  Bedside swallowing evaluation was done by the nurse on the floor and did not notice any difficulty with swallowing.  The patient denies any frequent falls or any other double vision.  No chest pain or shortness of breath.  No other complaints.  No family members at bedside.   PAST MEDICAL HISTORY:  Coronary  artery disease, end-stage renal disease on hemodialysis on Tuesday, Thursday, Saturday, last dialysis was yesterday and hypertension.   PAST SURGICAL HISTORY:  Left arm fistula, C-section.   ALLERGIES:  She is allergic to MORPHINE, ADHESIVE TAPE AND LATEX.   PSYCHOSOCIAL HISTORY:  Lives at home on disability.  Smokes 1 pack which lasts for two weeks.  Denies alcohol or illicit drug usage.   FAMILY HISTORY:  Father with hypertension and prostate cancer.  Mother had hypertension and gallstones and her siblings with hypertension.   HOME MEDICATIONS:  Simvastatin 20 mg once daily, sevelamer 1800 mg 3 capsules 3 times a day, pantoprazole 40 mg once daily, metoprolol succinate 50 mg once a day,  losartan 50 mg once daily, lisinopril 5 mg once daily, hydralazine 1 tablet by mouth q. 8 hours, aspirin 81 mg once daily.   REVIEW OF SYSTEMS: CONSTITUTIONAL:  Denies any fever or fatigue.  EYES:  Denies any blurry vision, double vision.  EARS, NOSE, THROAT:  Denies any epistaxis or discharge.  No nasal discharge.  Has chronic history of dysphagia, has a chronic sticking sensation when swallowing, but she is swallowing food without any worsening of difficulty in swallowing.  CARDIOVASCULAR:  Denies any chest pain, shortness of breath or palpitations.  RESPIRATORY:  Denies any cough or COPD.  GASTROINTESTINAL:  Denies any nausea or vomiting.  Denies abdominal pain, melena.   GENITOURINARY:  No dysuria or hematuria.  MUSCULOSKELETAL:  No joint effusion, tenderness or erythema.  INTEGUMENTARY:  No acne, rash, lesions.  On the left arm, in AV fistula area it is extremely tender to touch, but no bleeding or bruising noticed.  Positive bruit. MUSCULOSKELETAL:  No joint effusion, tenderness or erythema.  NEUROLOGIC:  Chronic left-sided weakness and chronic dysphagia.  No vertigo, no ataxia.  PSYCHIATRIC:  No ADD, OCD.  ENDOCRINE:  No hypothyroidism.  Denies diabetes.  HEMATOLOGIC AND LYMPHATIC:  No anemia, easy  bruising, bleeding.   PHYSICAL EXAMINATION:  VITAL SIGNS:  Temperature 98.4, pulse 92, respirations 18, blood pressure 123/87, pulse ox 100% on room air.  GENERAL APPEARANCE:  Not under acute distress.  Moderately built and nourished.  HEENT:  Normocephalic, atraumatic.  Pupils are equally reacting to light and accommodation.  No scleral icterus.  No conjunctival injection.  No sinus tenderness.  Extraocular movements are intact.  No nystagmus.   NECK:  Supple.  No JVD.  No thyromegaly.  No bruits.  Range of motion is intact.  LUNGS:  Clear to auscultation bilaterally.  No accessory muscle usage.  No anterior chest wall tenderness on palpation.  CARDIAC:  S1, S2 normal.  Regular rate and rhythm.  A positive 2/6 ejection systolic murmur.   GASTROINTESTINAL:  Soft.  Bowel sounds are positive in all four quadrants.  Nontender, nondistended.  No hepatosplenomegaly.  No masses felt.  NEUROLOGIC:  Awake, alert, oriented x 3.  Cranial nerves II through XII are grossly intact.  Negative cerebellar signs.  No pronator drift.  Deep tendon reflexes are 2+.  Motor is 3 to 4 out of 5 on the left upper and lower extremity.  The right upper and lower extremity motor is intact.  Sensory intact in both sides.  SKIN:  Warm to touch.  Normal turgor.  No rashes.  The AV fistula site on the left upper extremity is very tender to touch, but no bleeding or bruising noticed actively.  Positive bruit in the area.   PSYCHIATRIC:  Normal mood and affect.  Peripheral pulses are 2+.  LABORATORY AND IMAGING STUDIES:  CK total 195, CPK-MB 1.5, troponin 0.06.  WBC 5.3, hemoglobin 11.2, hematocrit 36.4, platelets 188.  PT 18.3, INR 1.5.  Glucose 84, BUN 29, creatinine 6.13, sodium 130, potassium 5.6, chloride 97, CO2 22, anion gap is 5.  GFR is 9, serum osmolality 266, calcium is 7.3.  Portable chest x-ray, stable appearance, although severe cardiomegaly without evidence of pulmonary edema or focal infiltrate.  A CAT scan of the head  without contrast has revealed no acute intracranial abnormality.  A 12-lead EKG, sinus tachy at 91 beats per minute, PR interval is prolonged at 254 beats per minute with first degree AV block, normal QRS duration, nonsignificant T wave abnormality, prolonged QT.   ASSESSMENT AND PLAN:  A 54 year old African American female presenting to the Emergency Room with a chief complaint of bleeding from AV fistula site x 2 yesterday and subsequently reported that she has been having left-sided weakness since yesterday evening, will be admitted with the following assessment and plan.  1.  Acute cerebrovascular accident with worsening of chronic left-sided weakness since yesterday evening.  Aspirin cannot be given as the patient is complaining of bleeding from the AV fistula x 2 yesterday.  We will do stroke work-up with MRI of the brain, carotid Dopplers, 2-D echocardiogram.  We will give a statin therapy.  Neurology consult is placed.  2.  Hemodialysis catheter site bleeding x 2 yesterday and tender to touch.  The bleeding is controlled currently.  We  will put a consult to vascular surgeon.  3.  Hyperkalemia.  Kayexalate per rectum is ordered.  We will put a consult to nephrology for possible dialysis as needed basis.  4.  Old history of stroke with chronic left-sided weakness and chronic dysphagia.  Aspirin is on hold in view of bleeding from AV fistula x 2 yesterday.  5.  Resume home medications.  6.  We will provide GI prophylaxis.  7.  DVT prophylaxis with SCDs.  8.  CODE STATUS:  SHE IS FULL CODE.  Brother is medical power of attorney.   Diagnosis and plan of care was discussed in detail with the patient.  She is aware of the plan.    Total time spent on admission is 45 minutes.    ____________________________ Nicholes Mango, MD ag:ea D: 05/11/2013 05:23:20 ET T: 05/11/2013 06:51:52 ET JOB#: [id number]  cc: Nicholes Mango, MD, <Dictator> Nicholes Mango MD ELECTRONICALLY SIGNED 05/17/2013 7:10

## 2014-10-05 NOTE — Consult Note (Signed)
[name], [name] MR#:  [id number] DATE OF BIRTH:  March 29, 1961  DATE OF CONSULTATION:  02/15/2013  PRIMARY CARE DOCTOR: Dr. Nicky Pugh.  CARDIOLOGY: Dr. Lujean Amel.  ATTENDING: Dr. Loletha Grayer    REASON FOR CONSULTATION: Dysphagia.   HISTORY OF PRESENT ILLNESS: [name] is a 54 year old African American female who has a significant medical history of coronary artery disease; end-stage renal disease, receiving dialysis Tuesdays, Thursdays and Saturdays; 2 strokes Vanessa past as well as 3 myocardial infarctions; hypertension and polysubstance abuse.   [name] presented to [facility] with severe chest pain, stated as a 9/10 in intensity with also shortness of breath. She presented to [facility] yesterday. Onset of pain also Vanessa night prior, stating it lasted for an hour and a half and which she was unable to fall asleep. Pain did radiate to her left arm and some burning sensation to her face.  Was resting at that time. Mother and daughter are present during interviewing process, who do state that Vanessa severity of chest pain as well as shortness of breath have seemingly gotten worse over Vanessa past 3 months. Most recent office visit with Dr. Clayborn Bigness was June. Ironically, she was supposed to have seen him this morning in followup. [name] has been experiencing dysphagia, which has been occurring since December of last year. She was scheduled for a colonoscopy as well as an upper endoscopy with Dr. Loistine Simas on August 25, but given Vanessa fact that cardiac risk assessment had not been done, procedures were canceled. Feeling of dysphagia, though according to family actually goes back prior to 6 months ago. Possibly some difficulty even occurring since her last stroke, which is also of question when that was between family and [name], but known history of stroke 2011 for sure. Feeling of food getting hung to Vanessa cricopharyngeal area, as well as pills and liquids.  Will regurgitate at times. Variable of when this happens; can occur hours later and will vomit undigested food or can be shortly after eating within an hour. Dry heaving at times. Positive for nausea, 26-pound weight loss over Vanessa past 3 to 4 weeks unintentionally. States that her normal dry weight is 70 kg. Bowel habit pattern is 4 to 5 times a day. No rectal bleeding. No melena. Denies any odynophagia.   Through further discussion with family members being present, it appears that there is a questionable history of pica. Per [name],  states that she will eat 3 to 4 boxes of Argo starch a day. She has been doing this since December. [name] states that she will put it in her mouth, she will chew it up until it gets hard and then spit it out.    PAST MEDICAL HISTORY: Coronary artery disease and 3 myocardial infarctions, then CVA, 2011, with suspected second one, though again that is up for discussion between family and [name] Short-term memory loss in association with neurological deficit from her stroke; endstage renal disease, receiving dialysis Tuesdays, Thursdays and Saturdays; hypertension. Polysubstance abuse for at least Vanessa past 25 years. Questionable last use, according to daughter.   PAST SURGICAL HISTORY: Left arm fistula, C-section, coronary stent placement with most recent December 2013.   ALLERGIES: MORPHINE AND ADHESIVE TAPE, LATEX.   MEDICATIONS: Aspirin 81 mg a day, hydralazine 25 mg every 8 hours as needed, Imdur 30 mg a day, losartan 50 mg daily, metoprolol succinate 50 mg extended-release daily, Ranexa 500 mg twice a day,  simvastatin 20 mg at bedtime and nitroglycerin as directed, as needed, and this is a 0.4 mg tablet, 1 tablet sublingual as directed, as needed.   SOCIAL HISTORY: Smoking a pack of cigarettes over a 2-week period. No alcohol. History of recreational drug use, according to family, suspected cocaine. [name] is on disability.   FAMILY HISTORY:  Father: Hypertension, prostate cancer. Mother: Hypertension, gallstones, fibroid tumors. Siblings with high blood pressure. Grandmother: Maternal, breast cancer diagnosed at Vanessa age of 12. Aunt, maternal: History of breast cancer diagnosed in her 47s.   REVIEW OF SYSTEMS:  CONSTITUTIONAL: Significant for sweating, chills, weight loss as stated above, fatigue.  EYES: No blurred vision. Ears, nose, mouth and throat: Positive for difficulty swallowing as stated; see HPI.  CARDIOVASCULAR: Significant for chest pain with shortness of breath.  RESPIRATORY: Significant for shortness of breath. No coughing.  GASTROINTESTINAL: See HPI.  GENITOURINARY: [name] urinates very little on her own, Dr. Candiss Norse aware. MUSCULOSKELETAL: Significant for arthralgias.  INTEGUMENTARY: Chronic pruritus , darkening of joint areas on elbows.  NEUROLOGICAL: No syncope, seizure disorder.  PSYCHIATRIC: No depression. No anxiety.  ENDOCRINE: No thyroid conditions.  HEMATOLOGIC/LYMPHATIC: Denies easy bruising and bleeding.   PHYSICAL EXAMINATION: VITAL SIGNS: Temperature 98, pulse 87, respirations 18, blood pressure 130/84 and pulse oximetry is 91% on room air.  GENERAL: Well-developed, well-nourished 54 year old [place] American female. No acute distress noted. No excessive scratching during interviewing process. Some difficulty in memory recall noted.  HEENT: Normocephalic, atraumatic. Pupils equal, reactive to light. Conjunctivae clear. Sclerae anicteric.  NECK: Supple. Trachea midline. No lymphadenopathy or thyromegaly.  PULMONARY: Symmetric rise and fall of chest. Clear to auscultation throughout.  CARDIOVASCULAR: Regular rhythm, S1, S2. No murmurs. No gallops.  ABDOMEN: Soft, nondistended. Bowel sounds in 4 quadrants. No bruits. No masses. No evidence of hepatosplenomegaly.  RECTAL: Deferred.  MUSCULOSKELETAL: Moving all 4 extremities. No contractures. No clubbing.  EXTREMITIES: No edema.  PSYCHIATRIC: Alert and  oriented x 4, but again noted delay in memory recall. Anxious mood at times.  NEUROLOGIC: No gross neurological deficit except for Vanessa difficulty in memory recall.   LABORATORY AND DIAGNOSTIC: Chemistry panel on admission: Glucose was 55. BUN was 67. Creatinine was 10.19. Sodium was 133, potassium 6.1, chloride 97, CO2 was 20. EGFR was 5. Note that these laboratory studies were done prior to having dialysis performed. She was supposed to have had it done yesterday morning, but actually had EMS transport her to [facility] due to her feeling ill. Today's date: Glucose 107. BUN is 38, creatinine is 5.90. EGFR is 9. Calcium has dropped from 9.0 to 8.3. Troponin level on admission was 0.07; second in series 0.08 and third troponin 0.09. Hepatic panel: Total protein was 9.2, total bilirubin 1.8. AST is 74; otherwise, panel within normal limits. CK on admission was 80 with a CK-MB of 2.3. CBC: RBC was 3.58 on admission: Hemoglobin 11.8, hematocrit 35.2. Today, hemoglobin has dropped to 9.3 with hematocrit of 27.4. Platelet count has remained within normal limits. EKG: Sinus rhythm with first-degree AV block. Chest: Single view on admission revealed severe cardiomegaly, stable from 02/09/2013, no evidence of congestive heart failure.   IMPRESSION: 1. Dysphagia with solid foods, liquids, as well as pills.   2. Known history of coronary artery disease.  3. Endstage renal disease.  4. Hypertension. 5. Chest pain with shortness of breath. 6. Suspect pica.   PLAN:  1. [name]'s presentation discussed with Dr. Verdie Shire. Pending cardiology evaluation given her history  of coronary artery disease. With current symptoms on admission of chest pain and shortness of breath, do feel that it is warranted at this time. Also concern of polysubstance abuse, cocaine, which is of concern given her cardiovascular history. I do feel [name] would benefit from proceeding forward with an upper endoscopy with possible  dilatation, but requesting cardiac evaluation first to occur pending Vanessa results. Our time frame of study to be performed to be determined in Vanessa future. 2. We will continue to monitor [name] during her hospitalization.  3. Did contact poison control and spoke to them about her taking Argo on a daily basis. Advised nontoxic substance. Most common concern is GI upset.   These services provided by Payton Emerald, NP, under collaborative agreement with Dr. Verdie Shire.   ____________________________ Payton Emerald, NP dsh:sg D: 02/15/2013 13:04:00 ET T: 02/15/2013 14:04:34 ET JOB#: [id number]  cc: Payton Emerald, NP, <Dictator>  Payton Emerald MD ELECTRONICALLY SIGNED 02/16/2013 14:35

## 2014-10-05 NOTE — H&P (Signed)
PATIENT NAME:  [name], [name] MR#:  [id number] DATE OF BIRTH:  09/04/1960  DATE OF ADMISSION:  02/14/2013  PRIMARY CARE PHYSICIAN: [name], MD   CARDIOLOGIST: Loran Senters. Callwood, MD  CHIEF COMPLAINT: Chest pain.   HISTORY OF PRESENT ILLNESS: This is a 54 year old female who developed severe chest pain, 9 out of 10 in intensity, unable to function, problems breathing. It happened 1-1/2 hours last night. She was unable to sleep after that. Pain radiated up into the left arm and had some burning in the face. She was at rest at the time. Now slight pressure, 3 out of 10 in intensity. She did have nausea and vomiting in the ambulance coming over here. She did have an appointment scheduled with Dr. Clayborn Bigness in the morning. Currently she is talking in complete sentences and lying flat. She also states that she has problems swallowing and has been having nausea and vomiting going on for weeks and had lost 20 pounds over the last couple of weeks. Swallowing issues can happen with liquids and/or solids. She has never had an endoscopy.   PAST MEDICAL HISTORY: Coronary artery disease; end-stage renal disease on dialysis Tuesday, Thursday, Saturday, did not go to dialysis today; hypertension.   PAST SURGICAL HISTORY: Left arm fistula and C-section.   ALLERGIES: MORPHINE AND ADHESIVE TAPE AND LATEX.   MEDICATIONS: As per Prescription Writer include aspirin 81 mg daily, hydroxyzine 25 mg every 8 hours as needed, Imdur 30 mg daily, losartan 50 mg daily, metoprolol succinate 50 mg extended-release daily, Ranexa 500 mg twice a day, simvastatin 20 mg at bedtime.   SOCIAL HISTORY: Smokes 1 pack, lasts for 2 weeks. No alcohol. No drug use. Is on disability.   FAMILY HISTORY: Father with hypertension and prostate cancer. Mother with hypertension and gallstones. Siblings with hypertension.   REVIEW OF SYSTEMS:    CONSTITUTIONAL: Positive for sweating. Positive for chills. Positive for weight loss.  Positive for fatigue.  EYES: No blurry vision.  EARS, NOSE, MOUTH AND THROAT: Positive for difficulty swallowing liquids and solids, regurgitating food, food gets caught.  CARDIOVASCULAR: Positive for chest pain.  RESPIRATORY: Positive for shortness of breath. No cough. No sputum.  GASTROINTESTINAL: Positive for nausea. Positive for vomiting. No abdominal pain. No diarrhea. No constipation. No bright red blood per rectum. No melena.  GENITOURINARY: No burning on urination. Urinates very little.  MUSCULOSKELETAL: Positive for joint pains.  INTEGUMENTARY: Positive for elbows, darkish on the left, but rough skin on the left elbow.  NEUROLOGIC: No fainting or blackouts.  PSYCHIATRIC: No anxiety or depression.  ENDOCRINE: No thyroid problems.  HEMATOLOGIC AND LYMPHATIC: No anemia.   PHYSICAL EXAMINATION: VITAL SIGNS: Temperature 97.8, pulse 77, respirations 18, blood pressure 138/85, pulse oximetry 98% on room air.  GENERAL: No respiratory distress, lying flat in bed.  EYES: Conjunctivae and lids normal. Pupils equal, round and reactive to light. Extraocular muscles intact. No nystagmus.  EARS, NOSE, MOUTH AND THROAT: Tympanic membranes: No erythema. Nasal mucosa: No erythema. Throat: No erythema. No exudate seen. Lips and gums: No lesions.  NECK: No JVD. No bruits. No lymphadenopathy. No thyromegaly. No thyroid nodules palpated.  RESPIRATORY: Lungs clear to auscultation. No use of accessory muscles to breathe. No rhonchi, rales or wheeze heard.  CARDIOVASCULAR: S1, S2 normal. Positive II/VI systolic ejection murmur. Carotid upstroke 2+ bilaterally. No bruits.  EXTREMITIES: Dorsalis pedis pulses 1+ bilaterally. Trace edema of the lower extremity.  ABDOMEN: Soft, nontender. No organomegaly/splenomegaly. Normoactive bowel sounds. No masses felt.  LYMPHATIC: No lymph nodes in the neck.  MUSCULOSKELETAL: No clubbing, edema or cyanosis.  SKIN: Rough, darkened skin over bilateral elbows, rough on the  left, smoother on the right. No other lesions seen.  NEUROLOGIC: Cranial nerves II through XII grossly intact. Deep tendon reflexes 2+ bilateral lower extremities.  PSYCHIATRIC: The patient is oriented to person, place and time.   LABORATORY AND RADIOLOGICAL DATA: White blood cell count 5.7, hemoglobin and hematocrit 11.9 and 35.2, platelet count 190. Glucose 55, BUN 67, creatinine 10.19, sodium 133, potassium 6.1, chloride 97, CO2 of 20, calcium 9.0. Total bilirubin 1.8, alkaline phosphatase 126, ALT 49, AST 74, total protein 9.2. Troponin borderline at 0.07. BNP 175,000.   Chest x-ray: Severe cardiomegaly. No evidence of CHF on chest x-ray. -ray.   ASSESSMENT AND PLAN: 1.  Severe hyperkalemia with potassium of 6.1, EKG with slight peaked T waves: This was treated with calcium gluconate, dextrose. IV insulin. Did speak with nephrology who will do urgent dialysis. The patient did miss dialysis today at her center.  2.  Chest pain with history of coronary artery disease with elevated troponin: We will get cardiology consultation to see if they want to do anything further. Will continue her usual medications including aspirin, Zocor, Ranexa, metoprolol, losartan and Imdur. Last time she was in the hospital, they did not do anything further. I will also get a GI consult because the patient complains of dysphagia to liquids and solids. I think the patient will need a GI work-up for further evaluation. We will put the patient on Protonix 40 mg IV stat and b.i.d. at this point. Will  put the patient n.p.o. after midnight just in case any procedures tomorrow.  3.  Hypertension: Will continue usual medications and continue to monitor.  4.  Tobacco abuse: Smoking cessation counseling done 3 minutes by me. No need for nicotine patch.  5.  Hyperlipidemia: Continue Zocor. Check a lipid profile in the morning. 6.  Low sugar on presentation: Will continue to monitor closely and will allow the patient to eat  tonight.  TIME SPENT ON ADMISSION: 55 minutes. Since the patient had a critically high potassium, high risk for arrhythmia, this is a critical care admission.    ____________________________ Tana Conch. Leslye Peer, MD rjw:jm D: 02/14/2013 17:58:33 ET T: 02/14/2013 18:15:20 ET JOB#: [id number]  cc: Tana Conch. Leslye Peer, MD, <Dictator> Mikeal Hawthorne. Brynda Greathouse, MD Dwayne D. Clayborn Bigness, MD  Marisue Brooklyn MD ELECTRONICALLY SIGNED 02/18/2013 14:24

## 2014-10-05 NOTE — Consult Note (Signed)
Brief Consult Note: Diagnosis: Dysphagia.  End-stage renal disease.  CAD.  Hypertension.  History of Polysubstance abuse.  Chest pain with shortness of breath.   Discussed with Attending MD.   Comments: Patient's presentation discussed with Dr. Verdie Shire.  Do recommend proceeding with EGD with possible dilatation.  Date of procedure to be decided, pending cardiology evaluation and recommendations.  Will continue to monitor.  Electronic Signatures: Payton Emerald (NP)  (Signed 03-Sep-14 13:06)  Authored: Brief Consult Note   Last Updated: 03-Sep-14 13:06 by Payton Emerald (NP)

## 2014-10-05 NOTE — Consult Note (Signed)
PATIENT NAME:  [name], [name] MR#:  [id number] DATE OF BIRTH:  Jan 05, 1961  DATE OF CONSULTATION:  02/10/2013  REFERRING PHYSICIAN:   CONSULTING PHYSICIAN:  Isaias Cowman, MD  PRIMARY CARE PHYSICIAN:  Dr. Brynda Greathouse.  PRIMARY CARDIOLOGIST:  Dr. Clayborn Bigness.   CHIEF COMPLAINT:  Chest pain.   HISTORY OF PRESENT ILLNESS:  The patient is a 54 year old female with known history of end-stage renal disease on chronic hemodialysis as well as known coronary artery disease and ischemic cardiomyopathy. The patient apparently has had a coronary stent three years ago and in 05/2012 by Dr. Clayborn Bigness. The patient reports that she had some binge eating and developed chest discomfort which lasted approximately 20 minutes and presented to [facility] Emergency Room. EKG was nondiagnostic. CPK-MB were negative. Troponin was borderline elevated to 0.08 and peaked at 0.11. BUN and creatinine at 13 and 4.41, respectively. The patient has remained chest pain free and wishes to go home.   PAST MEDICAL HISTORY:  1.  A history of coronary stent in three years ago and 05/2012.  2.  End-stage renal disease on chronic hemodialysis on Tuesday, Thursday, Saturdays. 3.  Ischemic cardiomyopathy with LVEF of 25%.  4.  Hypertension.  5.  A history of CVA.  6.  Chronic anemia.   MEDICATIONS:  1.  Aspirin 81 mg daily.  2.  Imdur 30 mg daily.  3.  Losartan 50 mg daily.  4.  Metoprolol succinate 50 mg daily.  5.  Simvastatin 20 mg at bedtime.  6.  Atarax 25 mg q.8 p.r.n.   SOCIAL HISTORY:  The patient currently lives with her daughter. She smokes less than a pack of cigarettes per day per day.   FAMILY HISTORY:  No immediate family history of coronary disease or myocardial infarction.   REVIEW OF SYSTEMS:  CONSTITUTIONAL:  No fever or chills.  EYES:  No blurry vision.  EARS:  No hearing loss.  RESPIRATORY:  No shortness of breath.  CARDIOVASCULAR:  Chest pain as described above.  GASTROINTESTINAL:  The patient reports that  her chest pain developed after binge eating last evening.  GENITOURINARY:  No dysuria or hematuria.  ENDOCRINE:  No polyuria or polydipsia.  MUSCULOSKELETAL:  No arthralgias or myalgias.  NEUROLOGICAL:  No focal muscle weakness or numbness. The patient does a history of prior stroke.   PHYSICAL EXAMINATION:  VITAL SIGNS:  Blood pressure 120/81, pulse 93, respirations 18, temperature 98.1, pulse ox 92%.  HEENT:  Pupils equal and reactive to light and accommodation.  NECK:  Supple without thyromegaly.  LUNGS:  Clear.  HEART:  Normal JVP. Normal PMI. Regular rate and rhythm. Normal S1, S2. No appreciable gallop, murmur, or rub.  ABDOMEN:  Soft and nontender.  EXTREMITIES:  Pulses were intact bilaterally.  MUSCULOSKELETAL:  Normal muscle tone.  NEUROLOGIC:  The patient is alert and oriented x 3. Motor and sensory both grossly intact.   IMPRESSION:  This is a 54 year old female with known history of coronary artery disease, ischemic cardiomyopathy as well as end-stage renal disease on chronic hemodialysis. The patient presents with chest pain with atypical features and electrocardiogram nondiagnostic. CPK-MB were negative; however, troponin was borderline elevated, which likely reflexes demand/supply ischemia, especially in light of end-stage renal disease and ischemic cardiomyopathy. The patient currently is chest pain-free.   RECOMMENDATIONS:  1.  Agree with overall current therapy.  2.  If the patient remains clinically stable, may defer for outpatient further cardiac diagnostics.  3.  Would defer full dose anticoagulation.  4.  Would defer invasive cardiac evaluation at this time.   ____________________________ Isaias Cowman, MD ap:jm D: 02/10/2013 14:44:45 ET T: 02/10/2013 14:57:15 ET JOB#: [id number]  cc: Isaias Cowman, MD, <Dictator> Isaias Cowman MD ELECTRONICALLY SIGNED 02/23/2013 7:58

## 2014-10-05 NOTE — Discharge Summary (Signed)
PATIENT NAME:  [name], [name] MR#:  [id number] DATE OF BIRTH:  03/09/1961  DATE OF ADMISSION:  02/10/2013 DATE OF DISCHARGE:  02/10/2013  DISCHARGE DIAGNOSES:  1. Noncardiac chest pain.  2. Hypertension.  3. Hyperlipidemia.  4. End-stage renal disease.  5. Chronic systolic heart failure.   DISCHARGE MEDICATIONS:  1. Losartan 50 mg p.o. daily.  2. Imdur 30 mg p.o. daily.  3. Ranexa 500 mg p.o. b.i.d.  4. Simvastatin 20 mg p.o. daily.  5. Aspirin 81 mg daily.  6. Metoprolol 50 mg 1 tablet daily.  7. Atarax 25 mg as needed for itching.   CONSULTATIONS: Cardiology consult with Dr. Saralyn Pilar. The patient also was seen by Dr. Candiss Norse for her dialysis.   FOLLOWUP: The patient advised to follow with dialysis on Saturday as scheduled and follow with Dr. Clayborn Bigness today.   HOSPITAL COURSE:  1. This is a 54 year old female patient with history of coronary artery disease and CABG and history of chronic systolic heart failure, EF of 25%, who came in because of chest pressure and also some vomiting. The patient had retrosternal chest pain and did not have any aggravating or relieving factors. Did not have any trouble breathing. She actually had stress test with Dr. Clayborn Bigness as an outpatient. I got the report from the clinic. It showed EF of 2%0 to 25% with fixed lateral wall defect, but the patient was seen by Dr. Saralyn Pilar, and because the patient's troponins were negative and she was chest pain-free, he recommended, because it is a long weekend, the patient can follow up with Dr. Clayborn Bigness today, that is Tuesday, and have probably cardiac catheterization as an outpatient. The patient's initial troponin was 0.08, and the following was 0.11, but CK and CPK-MB were normal, so Dr. Saralyn Pilar recommended that she can go home and follow up with Dr. Clayborn Bigness today. The patient was also seen by Dr. Candiss Norse for her dialysis, but she went home on Friday, so Saturday she is advised to follow with dialysis center for  her dialysis. Regarding her chest pain, she is on aspirin, beta blockers, and Dr. Clayborn Bigness suggested to add Ranexa, so we have added Ranexa, and prescription was given for Ranexa.  2. Hypertension. Blood pressure is 120/80 and heart rate 86 at the time of discharge. The patient has some compliance issues.  ____________________________ Epifanio Lesches, MD sk:OSi D: 02/14/2013 08:36:02 ET T: 02/14/2013 08:58:36 ET JOB#: [id number]  cc: Epifanio Lesches, MD, <Dictator> Mikeal Hawthorne. Brynda Greathouse, MD Epifanio Lesches MD ELECTRONICALLY SIGNED 02/24/2013 22:21
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
## 2014-10-05 NOTE — Consult Note (Signed)
PATIENT NAME:  [name], LEHL MR#:  [id number] DATE OF BIRTH:  Sep 10, 1960  DATE OF CONSULTATION:  10/10/2012  REFERRING PHYSICIAN: Nicholes Mango, MD CONSULTING PHYSICIAN:  Donivan Thammavong D. Clayborn Bigness, MD  PRIMARY PHYSICIAN: Mikeal Hawthorne. Brynda Greathouse, MD  NEPHROLOGIST: Tama High, MD  INDICATION: Generalized weakness, heart failure, leg edema.   HISTORY OF PRESENT ILLNESS: The patient is a 54 year old African-American female with multiple medical problems, including end-stage renal disease, cardiomyopathy, chronic systolic congestive heart failure, EF of about 25%. She is on chronic dialysis Tuesday, Thursday, Saturday. She has multivessel coronary artery disease with stent in the past. She presented to the Emergency Room with complaints of generalized weakness, swelling in the feet. The patient has been experiencing pedal edema for several days. She was unable to walk because of discomfort. BNP was elevated. She was given Lasix during dialysis. CT of the chest revealed mild pericardial effusion. The patient denies worsening chest pain, but has persistent shortness of breath. She has not been producing much urine recently. She has had trouble sleeping because of shortness of breath and finally came to the Emergency Room for evaluation.   PAST MEDICAL HISTORY:  1. Hypertension.  2. Coronary artery disease, history of cardiac cath, angioplasty and stent.  3. End-stage renal disease, dialysis.  4. Congestive heart failure.  5. Cardiomyopathy.  6. Myocardial infarction   PAST SURGICAL HISTORY:  1. Upper extremity AV fistula shunt.  2. Cardiac cath, angioplasty and stent.   SOCIAL HISTORY: Smokes. Lives with her boyfriend. Has a 30 year old daughter. Denies recent drug use.   FAMILY HISTORY: Hypertension, end-stage renal disease.   ALLERGIES: MORPHINE AND ADHESIVE TAPE.   MEDICATIONS:  1. Simvastatin 20.  2. Metoprolol 50.  3. Losartan 50.  4. Imdur 30.  5. Atarax 25.  6. Aspirin 81.    PHYSICAL EXAMINATION:  VITAL SIGNS: Blood pressure 140/85, pulse of 80, respiratory rate of 16, afebrile.  HEENT: Normocephalic, atraumatic. Pupils equal and reactive to light.  NECK: Supple. She has positive JVD. No bruits or adenopathy.  LUNGS: Bilateral rhonchi, rales at the base. No wheezing. Adequate air movement.  HEART: Regular rate and rhythm. S3. Soft S4. Systolic ejection murmur heard at the apex.  ABDOMEN: Benign.  EXTREMITIES: Within normal limits.  NEUROLOGIC: Intact.  SKIN: Normal.   LABORATORY: CT of the chest reveals small pericardial effusion, but is otherwise unremarkable. Glucose 106. BNP 140,639. BUN 42, creatinine 8.57, sodium 138, potassium 4.8, calcium 9.3. MB and CK were normal, troponin 0.06. White count of 5.7, hemoglobin 11, hematocrit 35, platelet count of 137. LFTs unremarkable. ABG: 7.40, 39, 77 on room air.   ASSESSMENT:  1. Heart failure.  2. Leg edema.  3. End-stage renal disease.  4. Hypertension.  5. Cardiomyopathy.  6. Coronary artery disease, history of angioplasty and stenting.  7. History of non-Q-wave myocardial infarction.  8. Hyperlipidemia.  9. Pruritus.   PLAN: Agree with admit. Rule out for myocardial infarction. Follow up cardiac enzymes. Follow up EKG. Place on anticoagulation. Continue routine dialysis. Will try to remove extra fluid as I am concerned that this may be pulmonary vascular congestion because of leg edema, and as she is anuric, the only way to remove fluid is by dialysis. Would recommend the patient stay on medical therapy. Recommend the patient maintain compliance with her diet. Advised the patient to quit smoking. Continue blood pressure control. Continue anticoagulation with aspirin. Continue lipid management. Continue reflux prophylaxis with omeprazole. Small pericardial effusion is clinically irrelevant. There is no  evidence of uremia. Would continue current medical therapy for now. Compliance is probably our biggest issue.  Will make further recommendations as the patient progresses.   ____________________________ Loran Senters Clayborn Bigness, MD ddc:OSi D: 10/10/2012 23:29:39 ET T: 10/11/2012 05:51:38 ET JOB#: [id number]  cc: Nalin Mazzocco D. Clayborn Bigness, MD, <Dictator> Yolonda Kida MD ELECTRONICALLY SIGNED 11/08/2012 14:49

## 2014-10-05 NOTE — H&P (Signed)
PATIENT NAME:  [name], RIEDINGER MR#:  [id number] DATE OF BIRTH:  02-19-1961  DATE OF ADMISSION:  02/10/2013  PRIMARY CARE PHYSICIAN: [name], MD  REFERRING PHYSICIAN: Connye Burkitt. Lovena Le, MD  CARDIOLOGIST: Loran Senters. Callwood, MD  HISTORY OF PRESENT ILLNESS: This is a 54 year old African-American female with past medical history significant for end-stage renal disease, on hemodialysis Tuesday, Thursday, Saturday, which she has completed today, coronary artery disease with a history of CABG, systolic congestive heart failure with ejection fraction of 25%, history of myocardial infarction as well as hypertension. She is presenting today for chest pain post emesis. After she completed her dialysis earlier this morning, she felt more fatigued than usual, had a bout of emesis which was followed by retrosternal chest pain of 5 to 6 out of 10 in intensity, without radiation, worsening or relieving factors. It seemed to last about 2 to 3 hours in total. She denied any associated shortness of breath, palpitations or orthopnea, though she has been dealing with nausea and vomiting for several months and is undergoing workup as an outpatient. Currently, symptoms have resolved in the emergency department. She denied any diaphoresis or impending sense of doom at the time of presentation.   REVIEW OF SYSTEMS:  CONSTITUTIONAL: Denies any vast weight changes, though generally fatigued.  EYES: No vision changes.  EARS, NOSE, THROAT AND MOUTH: No oral lesions.  CARDIOVASCULAR: Chest pain as described above.   RESPIRATIONS: No shortness of breath or cough.  GASTROINTESTINAL: Persistent nausea and vomiting.  GENITOURINARY: No dysuria. MUSCULOSKELETAL: No pain, although she does mention generalized weakness.  SKIN: No rashes or lesions.  NEUROLOGIC: No paresthesias or paralysis.  PSYCHIATRIC: No homicidal or suicidal ideation.  ENDOCRINE: She does mention once again generalized fatigue and weakness, but no vast  weight changes or urinary changes. HEMATOLOGY AND LYMPHATIC: No easy bruisability or bleeding. No lymphadenopathy. ALLERGY AND IMMUNOLOGY: No active symptoms. Otherwise, full review of systems performed by me is negative.   PAST MEDICAL HISTORY:  1. Hypertension. 2. Coronary artery disease status post CABG. Recent stress test a few months ago with unknown results.  3. End-stage renal disease, on hemodialysis Tuesday, Thursday and Saturday. 4. Chronic systolic congestive heart failure, ejection fraction 25%. 5. History of myocardial infarction in the past. 6. History of a stroke. 7. Anemia.  PAST SURGICAL HISTORY: 1. Left upper extremity AV fistula.   SOCIAL HISTORY: Current tobacco use of less than 1 pack a day. Denies alcohol or drug use. She lives with her daughter. Fairly functional for activities of daily living though with generalized weakness.  FAMILY HISTORY: Mother and dad with history of hypertension, grandfather with end-stage renal disease and dialysis.   ALLERGIES: MORPHINE, ADHESIVES, LATEX TAPE.  HOME MEDICATIONS:  1. Aspirin 81 mg p.o. daily. 2. Atarax 25 mg p.o. q.8 hours p.r.n. itching. 3. Imdur 30 mg p.o. daily. 4. Losartan 50 mg p.o. daily. 5. Metoprolol 50 mg extended release p.o. daily. 6. Simvastatin 20 mg p.o. at bedtime.  PHYSICAL EXAMINATION:  VITAL SIGNS: Respirations 18, blood pressure 139/91, saturating 96% on room air, temperature 98.1, heart rate 91.  GENERAL: In no acute distress, sleeping, somewhat difficult to arouse, though arousable.  HEENT: Normocephalic, atraumatic. Extraocular muscles intact. Pupils equal, round and reactive to light as well as accommodation. Dry mucosal membranes.  NECK: No lymphadenopathy. No thyromegaly. CARDIOVASCULAR: S1, S2, regular rate and rhythm. No murmurs, rubs or gallops.  PULMONARY: Clear to auscultation bilaterally without wheezes, rales or rhonchi.  ABDOMEN: Soft, nontender,  nondistended, with positive bowel  sounds.  EXTREMITIES: Reveal no cyanosis, edema or clubbing. NEUROLOGICAL: Cranial nerves II through XII intact. No gross neurological deficits noted.   LABORATORY DATA: Sodium 141, potassium 3.7, chloride 104, bicarb of 29, BUN 13, creatinine 4.41. Troponin I is 0.08, repeat troponin I is 0.11. WBC 4.9, hemoglobin 10, hematocrit 29.9, MCV 97, platelets 161. EKG: Sinus tachycardia at 100, occasional PVCs. No ST or T abnormalities.   ASSESSMENT AND PLAN: A 54 year old African-American female with significant medical history for end-stage renal disease, dialysis dependent on Tuesday, Thursday and Saturday, coronary artery disease, history of coronary artery bypass grafting, systolic congestive heart failure with ejection fraction 25%, history of myocardial infarction as well as hypertension, presenting for chest pain after a bout of emesis which occurred after her dialysis as scheduled, and it was retrosternal in nature, 5 to 6 out of 10 in intensity, without radiation or worsening or relieving factors. It seems to be resolved in the emergency department. She denied any shortness of breath, palpitations or orthopnea, though has been dealing with nausea and vomiting for several months.   1. Chest pain. She has multiple risk factors for cardiac disease and apparently had a stress test done a few months ago; however, the results are not present at this time. Her initial EKG revealed sinus tachycardia without any ST or T wave abnormalities. Will admit under observation. Trend cardiac enzymes. She may benefit from seeing her outpatient cardiologist, Dr. Clayborn [name], as she has apparently had some difficulty scheduling appointments and missing appointments when scheduled.  2. End-stage renal disease, hemodialysis dependent. She has received dialysis on the day of admission. Will hold off on consulting nephrology unless for some reason her hospital stay is prolonged, then she will require dialysis once again.  3.  Coronary artery disease status post coronary artery bypass grafting. Continue with aspirin, beta blocker and statin therapy. 4. Chronic systolic congestive heart failure, likely component of ischemic cardiomyopathy, ejection fraction of 25%. No symptoms of volume overload at this time. Continue current medical therapy.   CODE STATUS The patient is full code.  TOTAL TIME SPENT: 45 minutes.   ____________________________ Aaron Mose. Hower, MD dkh:OSi D: 02/10/2013 05:02:17 ET T: 02/10/2013 07:09:33 ET JOB#: [id number]  cc: Aaron Mose. Hower, MD, <Dictator> DAVID Woodfin Ganja MD ELECTRONICALLY SIGNED 02/11/2013 1:56

## 2014-10-05 NOTE — H&P (Signed)
PATIENT NAME:  [name], [name] MR#:  [id number] DATE OF BIRTH:  [birth year]  DATE OF ADMISSION:  10/10/2012  PRIMARY CARE PHYSICIAN: Nicky Pugh, MD  NEPHROLOGIST:  Anthonette Legato, MD / Murlean Iba, MD  CARDIOLOGIST:  Lujean Amel, MD  REFERRING PHYSICIAN: Lurline Hare, MD  CHIEF COMPLAINT: Generalized weakness and swelling in her legs.   HISTORY OF PRESENT ILLNESS: The patient is a 54 year old African American female with past medical history of chronic systolic congestive heart failure with recent ejection fraction of 25%, end-stage renal disease on dialysis Tuesday, Thursday and Saturdays, as well as multivessel coronary artery disease who had a stress test approximately 2 weeks ago by Dr. Etta Quill group with unknown results, who is presenting to the ER with the chief complaint of generalized weakness associated with swelling in her feet. The patient has been experiencing pedal edema for the past few days and yesterday she was unable to walk with the discomfort. The patient's BNP is elevated at 140,639. Her creatinine is at 8.57. Her last dialysis was on Saturday. The patient was given IV Lasix and CAT scan of the chest has revealed mild pericardial effusion. The patient denies any chest pain or shortness of breath. Mom is at bedside. Hospitalist team is called to admit the patient. The patient is basically anuric for the past 1 month, as reported by the patient's mom. The patient is quite tired this morning during my examination as she could not fall asleep for the past few days, but she is arousable and answers most of the questions.   PAST MEDICAL HISTORY: Hypertension, coronary artery disease status post cardiac cath recently and status post cardiac stress test 2 weeks ago with unknown results, history of renal disease with dialysis on Tuesday, Thursday and Saturday, chronic systolic congestive heart failure with ejection fraction of 25%, non-ST-elevation MI in the past, and history of  stroke.   PAST SURGICAL HISTORY: Left upper extremity AV fistula, recent cardiac cath.   PSYCHOSOCIAL HISTORY: Smokes 1 cigarette per day, as reported by the patient. Denies alcohol or illicit drug usage. Lives with her 35 year old daughter.   FAMILY HISTORY: Both mom and dad have history of hypertension. Brother has hypertension. Grandfather is dialysis-dependent.  ALLERGIES:  MORPHINE, ADHESIVE TAPE LATEX.   HOME MEDICATIONS:  1.  Simvastatin 20 mg once daily. 2.  Metoprolol succinate 50 mg once daily. 3.  Losartan 50 mg once daily. 4.  Isosorbide mononitrate 1 tablet once daily. 5.  Atarax 25 mg q. 8 hours  as needed for itching.  6.  Aspirin 81 mg once daily.  REVIEW OF SYSTEMS:   CONSTITUTIONAL:  Denies fever but complaining of fatigue and generalized weakness.  No weight loss.  EYES: Denies blurry vision, inflammation, glaucoma.  EARS, NOSE, THROAT: No ear pain, discharge, epistaxis, dentures, difficulty in swallowing. RESPIRATORY:  Denies any shortness of breath, cough or wheezing.  No history of painful respirations or COPD. CARDIOVASCULAR: No chest pain, palpitations or syncope.  GASTROINTESTINAL: No nausea, vomiting, diarrhea or GERD.  GENITOURINARY: No dysuria or hematuria.  GYNECOLOGIC AND BREASTS: No breast mass or vaginal discharge.  ENDOCRINE: No polyuria, nocturia or thyroid problems.  HEMATOLOGIC AND LYMPHATIC: Has chronic anemia from renal insufficiency. No bleeding.  No bruising.  INTEGUMENTARY: No acne, rash or lesions.  MUSCULOSKELETAL: No neck pain, back pain or shoulder pain. Denies any arthritis.  NEUROLOGIC:  No vertigo or ataxia. Has old history of CVA.  PSYCHIATRIC: No insomnia, ADD or OCD.   PHYSICAL EXAMINATION: VITAL SIGNS:  Temperature 98.2, pulse 79, respirations 18, blood pressure 138/92, pulse ox 96%.  GENERAL APPEARANCE: Not in any acute distress, moderately built and moderately nourished. The patient is tired as is early hours and also for the  past few days she was not falling asleep from discomfort.  HEENT: Normocephalic, atraumatic. Pupils are equally reacting to light and accommodation. No scleral icterus. Extraocular movements are intact. No sinus tenderness. No postnasal drip. No pharyngeal exudates.  NECK: Supple. No JVD. No thyromegaly.  LUNGS: Distant breath sounds. Minimal rales. HEART:  No murmurs, rubs or gallops. Positive peripheral edema.  ABDOMEN:  Soft. Bowel sounds are positive in all 4 quadrants. Nontender. Nondistended. No masses felt. No hepatosplenomegaly.  NEUROLOGIC:  Arousable but looks very tired as she did not sleep for the past few days.  Would follow verbal commands. Motor and sensory are grossly intact reflexes are 2+.  EXTREMITIES:  2+ pitting edema is present, upper knees.  Positive left upper extremity fistula with bruit. No cyanosis. No clubbing.  SKIN: Normal turgor. Warm to touch. No lesions. No rashes noticed.  PSYCHIATRIC: Normal mood and affect.  MUSCULOSKELETAL: No joint effusion, tenderness or erythema.  LABORATORY AND DIAGNOSTICS: CAT scan of the chest has revealed small pericardial effusion and cardiomegaly, numerous mediastinal lymph nodes which are prominent in size, thickening of the skin surface of the left breast and increased soft tissue markings in the left breast.  Correlation with physical examination would be helpful.  Glucose 106. BNP 140,639. BUN 42, creatinine 8.57, sodium 138, potassium 4.8, chloride 100, CO2 27, GFR 6, anion gap 11, serum osmolality 287, calcium 9.2. CK total 37, CPK-MB 1.7 and troponin initially 0.06 and eventually it is 0.08. WBC 5.7, hemoglobin 11.1, hematocrit 35.2 and platelets 137. LFTs: Total protein 7.6, albumin 3.1, bili total 0.9, alkaline phosphatase 74, AST 38 and ALT 32.  PH is 7.40, pCO2 39 and pO2 is 77% on FiO2 21%. Bicarb is 24.2. Lactic acid is 0.7.   ASSESSMENT AND PLAN: A 54 year old African American female on hemodialysis who with generalized  weakness and leg swelling.   1.  Acute on chronic systolic congestive heart failure exacerbation.  The patient is fluid overloaded. Call placed and discussed with Dr. Candiss Norse who will consider hemodialysis as soon as possible.  2.  Small pericardial effusion. Cardiology consult is placed to Dr. Clayborn Bigness. We will obtain 2-D echocardiogram. Indocin with close monitoring of the renal function and continuation of the hemodialysis.  3.  Chronically elevated troponin, probably from demand ischemia. We will continue cycling cardiac enzymes.  4.  End-stage renal disease, on hemodialysis. Nephrology consult is placed.  5.  Coronary artery disease.  We will try to obtain recent stress test results, which were done 2 weeks ago, from Dr. Clayborn Bigness. Resume home medication including Imdur.  6.  Gastrointestinal prophylaxis and deep vein thrombosis prophylaxis with heparin subcutaneous.  7.  She is FULL CODE. 8.  Regarding nicotine dependence, cessation counseling was given to the patient for 3 minutes.   The diagnosis and plan of care was discussed in detail with the patient and her mom at bedside.  They agree with the plan.   TOTAL TIME SPENT ON ADMISSION: 50 minutes. ____________________________ Nicholes Mango, MD ag:sb D: 10/10/2012 06:27:16 ET T: 10/10/2012 07:06:14 ET JOB#: [id number]  cc: Nicholes Mango, MD, <Dictator> Dwayne D. Clayborn Bigness, MD Mikeal Hawthorne Brynda Greathouse, MD Murlean Iba, MD Nicholes Mango MD ELECTRONICALLY SIGNED 10/14/2012 5:19

## 2014-10-05 NOTE — Consult Note (Signed)
EGD showed reflux esophagitis. No stricture seen, but 85 [address] diilator passed. [place] for discharge today. Make sure patient stays on daily PPI. If dysphagia persists, then patient can f/u with [place] in office later. Will sign off. Thanks.  Electronic Signatures: Verdie Shire (MD)  (Signed on 05-Sep-14 15:15)  Authored  Last Updated: 05-Sep-14 15:15 by Verdie Shire (MD)

## 2014-10-05 NOTE — Consult Note (Signed)
   Present Illness 54 yo female with history of end state renal disease on hemodialysis, cardiomyopathy, cad s/p cabg, bipolar diseorder admitted with increasing shortness of breath and chest pain. She states she has been compliant with her medicaitons She admits to missing her last hemodialysis due to complaints of chest pain. CXR does not suggest significant chf. She hs ruled out for an mi. She states the pain is improved isnce admission. She is going to be dialyzed this afternoon. She is resting comfortably at present and eating without difficulty. She denis shortness of breath.   Physical Exam:  GEN no acute distress   HEENT PERRL   NECK supple   RESP normal resp effort  clear BS   CARD Regular rate and rhythm  Murmur   Murmur Systolic   Systolic Murmur axilla   ABD denies tenderness   LYMPH negative axillae   EXTR negative cyanosis/clubbing, negative edema   SKIN normal to palpation   NEURO cranial nerves intact, motor/sensory function intact   PSYCH A+O to time, place, person   Review of Systems:  Subjective/Chief Complaint shortness of breath and chest pian.   General: Weakness   Skin: No Complaints   ENT: No Complaints   Eyes: No Complaints   Neck: No Complaints   Respiratory: Short of breath   Cardiovascular: Chest pain or discomfort  Dyspnea   Gastrointestinal: No Complaints   Genitourinary: No Complaints   Vascular: No Complaints   Musculoskeletal: No Complaints   Neurologic: No Complaints   Hematologic: No Complaints   Endocrine: No Complaints   Psychiatric: No Complaints   Review of Systems: All other systems were reviewed and found to be negative   Medications/Allergies Reviewed Medications/Allergies reviewed   EKG:  EKG NSR   Abnormal NSSTTW changes    Morphine: Rash  Adhesive: Blisters  Latex: Blisters, Hives   Impression 54 yo female with history of cad s/p cabg with cardiomyopathy who was admitted after developing chest  pian and shorntess of breath. She has rule dout for an mi and her ekg is unremarkable. She admits to missing her secheduled hemodialysis this week. She is stable at present. Troponins are not signficniantly elevatd. CXR does not suggest significant chf. Elevated bnp secondary to renal insufiency.   Plan 1. COnitnue current meds 2. Hemodilaysis 3. Ambulate and condsider discharge after hemodialysis if stable. 4. Would difer inasive cardiac evaluation at present unless course changes.   Electronic Signatures: Teodoro Spray (MD)  (Signed 03-Sep-14 21:07)  Authored: General Aspect/Present Illness, History and Physical Exam, Review of System, EKG , Allergies, Impression/Plan   Last Updated: 03-Sep-14 21:07 by Teodoro Spray (MD)

## 2014-10-05 NOTE — Consult Note (Signed)
General Aspect CC:  bleeding from right upper arm fistula   Present Illness HISTORY OF PRESENTING ILLNESS: A 54 year old African American female with frequent admissions to [facility], most recent ones are for chest pain that were ruled out for any cardiac ischemia. She has a history of end-stage renal disease on hemodialysis Tuesday, Thursday, and Saturday, history of hypertension, and coronary artery disease admitted to the Emergency Department with bleeding fistula. Bleeding started from her AV fistula that is on the left side about a day prior to admission which self resolved. The following day, the patient had another bleeding episode at which time she presented to the Emergency Department. By the time she presented in the emergency department the bleeding had stopped again.  The patient was going to be discharged but then of note, on examination, the patient was noted to be weaker on her left upper extremity, slightly in the left lower extremity.  She was admitted for a stroke work up.  Initiial head CT was negative for acute event.   PAST MEDICAL HISTORY: Coronary artery disease and endstage renal disease, on dialysis.    PSYCHOSOCIAL: The patient lives at home and she is disabled. She is a pack smoker which lasts for about 2 weeks.   FAMILY HISTORY: Father has history of hypertension and prostate cancer.   Home Medications: Medication Instructions Status  losartan 50 mg oral tablet 1 tab(s) orally once a day Active  simvastatin 20 mg oral tablet 1 tab(s) orally once a day (at bedtime) Active  metoprolol succinate 50 mg oral tablet, extended release 1 tab(s) orally once a day Active  ranolazine 500 mg oral tablet, extended release 1 tab(s) orally 2 times a day Active  Aspir 81 oral delayed release tablet 1 tab(s) orally once a day Active  hydrOXYzine hydrochloride 25 mg oral tablet 1 tab(s) orally every 8 hours, As Needed Active  pantoprazole 40 mg oral delayed  release tablet 1 tab(s) orally once a day Active  lisinopril 5 mg oral tablet 1 tab(s) orally once a day Active  sevelamer 800 mg oral tablet 3 tab(s) orally 3 times a day Active    Morphine: Rash  Adhesive: Blisters  Latex: Blisters, Hives  Case History and Physical Exam:  HEENT PERLA   Chest/Lungs Clear   Cardiovascular Normal Sinus Rhythm  palpable thrill in L UE AVF with pseudoaneurysm at proximal aspect of fistula.  No skin ulceration   Nursing/Ancillary Notes: **Vital Signs.:   27-Nov-14 03:08  Vital Signs Type Admission  Temperature Temperature (F) 98.6  Pulse Pulse 105  Respirations Respirations 18  Systolic BP Systolic BP 937  Diastolic BP (mmHg) Diastolic BP (mmHg) 93  Pulse Ox % Pulse Ox % 94    07:41  Vital Signs Type Q 4hr  Temperature Temperature (F) 97.2  Pulse Pulse 79  Respirations Respirations 18  Systolic BP Systolic BP 902  Diastolic BP (mmHg) Diastolic BP (mmHg) 93  Pulse Ox % Pulse Ox % 99    11:00  Vital Signs Type Routine  Temperature Temperature (F) 97.5  Pulse Pulse 59  Respirations Respirations 18  Systolic BP Systolic BP 409  Diastolic BP (mmHg) Diastolic BP (mmHg) 71  Pulse Ox % Pulse Ox % 97   Routine Chem:  26-Nov-14 23:50   Glucose, Serum 84  BUN  29  Creatinine (comp)  6.13  Sodium, Serum  130  Potassium, Serum  5.6  Chloride, Serum  97  CO2, Serum 22  Calcium (Total),  Serum  7.3  Anion Gap 11  eGFR (African American)  9  Cardiac:  26-Nov-14 23:50   CK, Total 195  CPK-MB, Serum 1.5 (Result(s) reported on 11 May 2013 at 01:13AM.)  Troponin I  0.06 (0.00-0.05 0.05 ng/mL or less: NEGATIVE  Repeat testing in 3-6 hrs  if clinically indicated. >0.05 ng/mL: POTENTIAL  MYOCARDIAL INJURY. Repeat  testing in 3-6 hrs if  clinically indicated. NOTE: An increase or decrease  of 30% or more on serial  testing suggests a  clinically important change)  Routine Coag:  26-Nov-14 23:50   INR 1.5 (INR reference interval applies to  patients on anticoagulant therapy. A single INR therapeutic range for coumarins is not optimal for all indications; however, the suggested range for most indications is 2.0 - 3.0. Exceptions to the INR Reference Range may include: Prosthetic heart valves, acute myocardial infarction, prevention of myocardial infarction, and combinations of aspirin and anticoagulant. The need for a higher or lower target INR must be assessed individually. Reference: The Pharmacology and Management of the Vitamin K  antagonists: the seventh ACCP Conference on Antithrombotic and Thrombolytic Therapy. GYBWL.8937 Sept:126 (3suppl): N9146842. A HCT value >55% may artifactually increase the PT.  In one study,  the increase was an average of 25%. Reference:  "Effect on Routine and Special Coagulation Testing Values of Citrate Anticoagulant Adjustment in Patients with High HCT Values." American Journal of Clinical Pathology 2006;126:400-405.)  Routine Hem:  26-Nov-14 23:50   WBC (CBC) 5.3  Hemoglobin (CBC)  11.0  Hematocrit (CBC)  33.4  Platelet Count (CBC) 188 (Result(s) reported on 11 May 2013 at 12:02AM.)    Impression ESRD with bleeding from AVF following dialysis, now with hematoma   Plan ASSESSMENT AND PLAN:  A 54 year old African American female presenting to the Emergency Room with a chief complaint of bleeding from AV fistula site x 2 yesterday and subsequently reported that she has been having left-sided weakness since yesterday evening, will be admitted with the following assessment and plan.  1.  Acute cerebrovascular accident with worsening of chronic left-sided weakness since yesterday evening.  Aspirin cannot be given as the patient is complaining of bleeding from the AV fistula x 2 yesterday.  We will do stroke work-up with MRI of the brain, carotid Dopplers, 2-D echocardiogram.  We will give a statin therapy.  Neurology consult is placed.  2.  Hemodialysis catheter site bleeding x 2 yesterday and  tender to touch.  There is no active bleeding.  She has a pseudoaneurysm vs hematoma over the proximal aspect of her fistula.  No acute intervention is required, however she will need a fistulagram as an outpatient to rule out stenosis within her fistula.  Access of her fistula can continue, but canulation will need to be remote from her area of tenderness / hematoma / pseudoaneurysm 3.  Hyperkalemia.  Kayexalate per rectum is ordered.  We will put a consult to nephrology for possible dialysis as needed basis.  4.  Old history of stroke with chronic left-sided weakness and chronic dysphagia. OK to continue asprin 5.  Resume home medications.  6.  We will provide GI prophylaxis.  7.  DVT prophylaxis with SCDs.  8.  CODE STATUS:  SHE IS FULL CODE.  Brother is medical power of attorney.   Electronic Signatures: Serafina Mitchell (MD)  (Signed [phone] 14:51)  Authored: General Aspect/Present Illness, Home Medications, Allergies, History and Physical Exam, Vital Signs, Labs, Impression/Plan   Last Updated: 27-Nov-14 14:51 by Harold Barban  W (MD)

## 2014-10-05 NOTE — Discharge Summary (Signed)
PATIENT NAME:  [name], [name] MR#:  [id number] DATE OF BIRTH:  Dec 27, 1960  DATE OF ADMISSION:  09/11/2012 DATE OF DISCHARGE:  09/14/2012  CONSULTANTS:  1. Dr. Holley Raring and Dr. Candiss Norse from nephrology. 2. Dr. Bary Leriche from psychiatry. 3. Roderic Palau, NP from cardiology.   CHIEF COMPLAINT: Shortness of breath.   DISCHARGE DIAGNOSES: 1. Acute on chronic systolic congestive heart failure with ejection fraction of about 25%.  2. End-stage renal disease on dialysis.  3. Noncompliance.  4. Coronary artery disease status post stenting in the past.  5. Hypertension.  6. Bronchitis.  7. Vaginal discharge to be evaluated as an outpatient by Dr. Kenton Kingfisher tomorrow.  8. Behavioral issues in the setting of frontal lobe strokes.  9. Tobacco abuse.  10. History of a myocardial infarction in the past.   DISCHARGE MEDICATIONS: 1. Levaquin 250 mg every 2 days for 4 days total. Please stop after 2 doses.  2. Losartan 50 mg daily.  3. Metoprolol succinate 50 mg daily.  4. Isosorbide mononitrate 30 mg once a day.  5. Aspirin 81 mg daily.  6. Simvastatin 20 mg at bedtime.   DIET: Low-sodium low-fat, low-cholesterol renal diet.   ACTIVITY: As tolerated.   FOLLOWUP: Please follow with PCP and your cardiologist within 1 to 2 weeks. Please follow with Dr. Kenton Kingfisher, your OB/GYN, in the next 1 or 2 days.   DISPOSITION: Home.   CODE STATUS:  FULL CODE.    SIGNIFICANT LABORATORY, DIAGNOSTIC AND RADIOLOGICAL DATA:  Initial BNP was 147,000. BUN 20, creatinine 5.83. LFTs showed albumin of 3.1, otherwise within normal limits. Troponins were less than 0.1 x 4. CK-MB and CK totals were within normal limits x 3. Initial WBC 4.7, ESR 17, hemoglobin 11.4. Blood cultures on March 31st no growth to date x 2.   CT of the head without contrast showing findings consistent with microangiopathy in previous right and left frontal regions along with vertex show chronic lacunar infarction with subsequent encephalomalacia.   X-ray of the chest, PA and lateral on March 30th, showed interstitial infiltrate likely representing pulmonary edema.  Repeat x-ray of the chest on the 31st, AP or lateral 1-way, showing cardiomegaly.   HISTORY OF PRESENT ILLNESS AND HOSPITAL COURSE: For full details of history and physical, please see the dictation on March 30th by Dr. Bridgette Habermann; but, briefly, this is a pleasant African American female at the age of 84 with chronic systolic congestive heart failure, EF of about 25% with multi-vessel disease, coronary artery disease per cath done late last year, end-stage renal disease on dialysis who presented with acute onset shortness of breath, lower extremity edema and was admitted to the hospitalist service for acute on chronic systolic CHF. The patient stated that she had a bout of vomiting which went on for some time, and felt thirsty the following day, and had excessive fluid intake and, therefore, presented with shortness of breath. She had elevated BNP of about 147,000 and borderline elevated troponin.  She was admitted to the hospitalist service. Nephrology was consulted, and the patient underwent dialysis. She did have a lot of fever, and blood cultures were sent the day after admission, and they have been negative so far. The patient has been having a cough as well, and x-ray of the chest is negative for pneumonia, possibly is a bronchitis. She has been started on Levaquin and will be discharged on 2 additional doses of Levaquin dosed every other day. The patient was seen by cardiology NP.  Diet and fluid  restriction has been discussed with her again. The patient appears to be noncompliant. The patient does have some behavioral issues per my discussions with the family on several occasions. The patient has been having some memory issues as well. It is possible her memory and behavioral issues are secondary to frontal lobe strokes, and the patient might be developing a multiple infarct dementia-type  syndrome; however, she is yet young. She was seen by Psychiatry, Dr. Bary Leriche, but the patient has refused medications at this point. She did complain of some vaginal discharge, and an appointment has been set up with Dr. Kenton Kingfisher for an examination. At this point, she will be discharged with outpatient follow-up and further testing per cardiology NP done as an outpatient. Currently, she is at her baseline walking and ambulating without significant shortness of breath and is euvolemic. Of note, the patient also has EF on the lower side and would benefit from evaluation for an AICD placement if her echocardiogram shows persistent low EF.   TOTAL TIME SPENT:  35 minutes.   ____________________________ Vivien Presto, MD sa:cb D: 09/14/2012 15:49:09 ET T: 09/14/2012 17:01:37 ET JOB#: [id number]  cc: Vivien Presto, MD, <Dictator> Corey Skains, MD Murlean Iba, MD Patient's PCP Vivien Presto MD ELECTRONICALLY SIGNED 09/15/2012 22:14

## 2014-10-05 NOTE — Discharge Summary (Signed)
PATIENT NAME:  [name], [name] MR#:  [id number] DATE OF BIRTH:  Sep 05, 1960  DATE OF ADMISSION:  10/10/2012 DATE OF DISCHARGE:  10/11/2012  ADMITTING PHYSICIAN: Dr. Waldron Labs  DISCHARGING PHYSICIAN: Dr. Gladstone Lighter   PRIMARY CARE PHYSICIAN: Brynda Greathouse   CONSULTATIONS IN THE HOSPITAL:  1.  Nephrology consultation by Dr. Anthonette Legato.  2.  Cardiology consultation with Dr. Clayborn Bigness.   DISCHARGE DIAGNOSES: 1. Acute on chronic systolic congestive heart failure exacerbation, ejection fraction less than 25%.  2.  End-stage renal disease on Tuesday, Thursday, Saturday hemodialysis.  3.  Hypertension.  4.  Coronary artery disease, status post stents.  5.  History of CVA.  6.  Anemia of chronic disease.  7.  Noncompliance.  DISCHARGE HOME MEDICATIONS:  1.  Losartan 50 mg p.o. daily.  2.  Imdur 30 mg daily.  3.  Simvastatin 20 mg p.o. at bedtime.  4.  Aspirin 81 mg p.o. daily.  5.  Toprol 50 mg p.o. daily.  6.  Atarax 25 mg q. 8 hours p.r.n. for itching.   DISCHARGE DIET: Renal diet.   DISCHARGE ACTIVITY: As tolerated.    FOLLOWUP INSTRUCTIONS: Follow up for dialysis on 10/13/2012 as an outpatient. PCP follow-up in one week. Mammogram advised   LABORATORY, DIAGNOSTIC AND RADIOLOGIC DATA: WBC 4.7, hemoglobin 10.9, hematocrit 33.1, platelet count 97.   Sodium 135, potassium 4.5, chloride 98, bicarbonate 31, BUN 29, creatinine 5.83, glucose 116 and calcium of 8.4. Troponin slightly elevated at 0.06.   LDL 25, HDL 32, triglycerides 66. Hepatitis B antigen is negative.   CT of the chest without contrast showing multiple mediastinal lymph nodes. Follow up suggested. Severe cardiomegaly and coronary artery disease. Pericardial effusion seen and skin thickening noted on that left breast.   BRIEF HOSPITAL COURSE: The patient is a 54 year old African American female with past medical history significant for coronary artery disease status post stent, end-stage renal disease on Tuesday,  Thursday, Saturday hemodialysis, hypertension, history of cerebrovascular accident, who presented to the hospital secondary to difficulty breathing and also worsening pedal edema. 1.  Acute on chronic systolic congestive heart failure exacerbation. The patient has cardiomyopathy and end-stage renal disease and is anuric, so she was not given any diuretic and was taken for dialysis on consecutive days for 2 on admission. Her breathing improved a lot and according to her nephrologist, Dr. Holley Raring, the patient usually leaves early during regular outpatient dialysis sessions and that probably is the cause for her fluid buildup.  After her 2  dialysis sessions, she was feeling extremely well, back to baseline and was discharged home.  2.  End-stage renal disease on Tuesday, Thursday, Saturday hemodialysis. As mentioned above, the patient had 2 consecutive days of dialysis in the hospital due for dialysis on 10/13/2012 and advised to stay for the whole dialysis session; otherwise, she might come back to the hospital for admission again, also advised to be compliant with her medications.  3.  Coronary artery disease, status post stents in the past with recent cardiac catheterization in December 2013 showing several occlusions and medical management recommended at this time. She follows with Dr. Clayborn Bigness as an outpatient and just had a stress test about 2 weeks ago and was also seen by Dr. Clayborn Bigness in the hospital.  Her echocardiogram revealed a tiny pericardial effusion, which is nonsignificant.  4.  Hypertension.  Her home medications were continued.  5.  Her course has been otherwise uneventful in the hospital.   DISCHARGE CONDITION: Stable.  DISCHARGE DISPOSITION: Home.   TIME SPENT ON DISCHARGE: 40 minutes  ____________________________ Gladstone Lighter, MD rk:cc D: 10/11/2012 15:47:57 ET T: 10/11/2012 19:38:07 ET JOB#: [id number]  cc: Gladstone Lighter, MD, <Dictator> Mikeal Hawthorne. Brynda Greathouse, MD Tama High, MD Gladstone Lighter MD ELECTRONICALLY SIGNED 10/13/2012 15:42

## 2014-10-05 NOTE — Consult Note (Signed)
General Aspect 54 year old [place] American female with history,  multivessel coronary disease,status post stent placementDecember 2013 with ejection fraction of 25%, history of renal failure on dialysis, history of stroke and hypertension, who presented with acute on chronic systolic heart failure after patient drank excessive amounts of fluid due to extreme thirst following dialysis.  Patient states she's never had to limit her fluids or salt before, has never really had issues with fluid retention or shortness breath secondary to volume overload.  She was also slightly confused on admission.  BNP is elevated over 1 47,000.  Troponin slightly elevated at 0.07 probably secondary to demand ischemia from renal failure.  Patient was diuresed.  Currently feels very well.  The shortness breath has resolved.  No PND orthopnea or pedal edema.  No chest pain.  She is wanting to go home stating that she feels back to her baseline and understands now that she has to be careful with salt, fluid and make sure she does daily weights so she can be aware   if she starts retaining fluid early in the process. She is already set up for outpatient test with Dr. Clayborn Bigness, including a surface echocardiogram and a stress test which she plans to have done next week   Physical Exam:  GEN well developed, no acute distress   HEENT pink conjunctivae, hearing intact to voice, moist oral mucosa   NECK supple   RESP normal resp effort  clear BS   CARD Regular rate and rhythm  Normal, S1, S2   ABD denies tenderness  soft   EXTR negative edema   SKIN normal to palpation   NEURO cranial nerves intact, motor/sensory function intact   PSYCH alert, A+O to time, place, person, good insight   Review of Systems:  Subjective/Chief Complaint shortness of breath   Respiratory: Short of breath  resolved   Cardiovascular: Dyspnea  Edema  resolved   Genitourinary: anuria   Musculoskeletal: cramps at timesin   Review of  Systems: All other systems were reviewed and found to be negative   Lab Results: Cardiology:  30-Mar-14 08:39   Ventricular Rate 99  Atrial Rate 99  P-R Interval 198  QRS Duration 102  QT 350  QTc 449  P Axis 69  R Axis 23  T Axis 138  ECG interpretation Normal sinus rhythm Possible Left atrial enlargement Cannot rule out Anterior infarct , age undetermined Abnormal ECG When compared with ECG of 24-May-2012 13:17, T wave inversion now evident in Lateral leads ----------unconfirmed---------- Confirmed by OVERREAD, NOT ([id number]), editor PEARSON, BARBARA ([id number]) on 09/13/2012 2:42:52 PM   Radiology Results: XRay:    30-Mar-14 08:26, Chest PA and Lateral  Chest PA and Lateral   REASON FOR EXAM:    shortness of breath  COMMENTS:       PROCEDURE: DXR - DXR CHEST PA (OR AP) AND LATERAL  - Sep 11 2012  8:26AM     RESULT: Comparison is made to prior study dated 10/14/2010.    Findings: The patient has taken a shallow inspiration. There is   prominence of the interstitial markings and mild peribronchial cuffing.   The cardiac silhouette is enlarged indicative of cardiomegaly. No focal   regions of consolidation or focal infiltrates are appreciated. The   visualized bony skeleton is unremarkable.    IMPRESSION:    1. Interstitial infiltrate likely representing pulmonary edema. An   infectious or inflammatory etiology cannot be excluded. Surveillance   evaluation is recommended status post appropriate  therapeutic regimen.    Thank you for the opportunity to contribute to the care of your patient.         Verified By: Mikki Santee, M.D., MD    Morphine: Rash  Adhesive: Blisters  Latex: Blisters, Hives   Impression 1.Acute on chronic systolic heart failure with known CAD, EF 25%  end stage renal disease, fluid overload probably secondary to noncompliance with excessive fluid intake, now resolved with dialysis, currently feeling well with any shortness of breath chest pain , wanting  to be discharged home. 2.  Mildly elevated troponin thought to be secondary to demand ischemia from renal failure and not acute coronary syndrome.   Plan 1.  Long talk with the patient regarding daily weights, watching for trends indicating  volume overload, limiting total amount of daily fluid and watching total sodium intake. 2.  Patient already has outpatient echo and stress test pending within the week with followup with Dr. Clayborn Bigness.From a cardiology standpoint she can be discharged home with it being stressed to the patient  to make sure she follows up with outpatient test and followup.  Patient was seen in collaboration with Dr. Nehemiah Massed.   Electronic Signatures: Roderic Palau (NP)  (Signed 01-Apr-14 17:16)  Authored: General Aspect/Present Illness, History and Physical Exam, Review of System, Labs, Radiology, Allergies, Impression/Plan   Last Updated: 01-Apr-14 17:16 by Roderic Palau (NP)

## 2014-10-05 NOTE — Consult Note (Signed)
Chief Complaint:  Subjective/Chief Complaint Had HD earlier today. Dysphagia persists.   VITAL SIGNS/ANCILLARY NOTES: **Vital Signs.:   04-Sep-14 08:17  Vital Signs Type Routine  Temperature Temperature (F) 98.2  Celsius 36.7  Pulse Pulse 76  Pulse source if not from Vital Sign Device per cardiac monitor  Respirations Respirations 18  Systolic BP Systolic BP 563  Diastolic BP (mmHg) Diastolic BP (mmHg) 78  Mean BP 94  Pulse Ox % Pulse Ox % 96  Pulse Ox Activity Level  At rest  Oxygen Delivery Room Air/ 21 %   Brief Assessment:  GEN no acute distress   Cardiac Regular   Respiratory clear BS   Gastrointestinal Normal   Lab Results: Routine Chem:  04-Sep-14 05:20   Glucose, Serum  114  BUN  57  Creatinine (comp)  8.20  Sodium, Serum 136  Potassium, Serum 4.8  Chloride, Serum 100  CO2, Serum 26  Calcium (Total), Serum  8.3  Anion Gap 10  Osmolality (calc) 289  eGFR (African American)  6  eGFR (Non-African American)  5 (eGFR values <60m/min/1.73 m2 may be an indication of chronic kidney disease (CKD). Calculated eGFR is useful in patients with stable renal function. The eGFR calculation will not be reliable in acutely ill patients when serum creatinine is changing rapidly. It is not useful in  patients on dialysis. The eGFR calculation may not be applicable to patients at the low and high extremes of body sizes, pregnant women, and vegetarians.)    10:18   Phosphorus, Serum 4.7 (Result(s) reported on 16 Feb 2013 at 10:55AM.)   Assessment/Plan:  Assessment/Plan:  Assessment Dysphagia.   Plan Plan EGD tomorrow. Perhaps discharge tomorrow afternoon. Thanks.   Electronic Signatures: OVerdie Shire(MD)  (Signed 04-Sep-14 15:59)  Authored: Chief Complaint, VITAL SIGNS/ANCILLARY NOTES, Brief Assessment, Lab Results, Assessment/Plan   Last Updated: 04-Sep-14 15:59 by OVerdie Shire(MD)

## 2014-10-05 NOTE — Discharge Summary (Signed)
[name], [name] MR#:  [id number] DATE OF BIRTH:  1961-04-04  DATE OF ADMISSION:  05/11/2013 DATE OF DISCHARGE:  05/11/2013  ADMITTING PHYSICIAN: Nicholes Mango, MD  DISCHARGING PHYSICIAN: Gladstone Lighter, MD  PRIMARY CARE PHYSICIAN: Mikeal Hawthorne. Brynda Greathouse, Roby:  1. Neurology consultation by Leotis Pain, MD  2. Nephrology consultation by Tama High, MD  DISCHARGE DIAGNOSES:  1. Bleeding from arteriovenous fistula site, which resolved spontaneously.  2. End-stage renal disease, on Tuesday, Thursday, Saturday hemodialysis.  3. Hypertension.  4. History of cerebrovascular accident, with left-sided weakness.  5. Trauma with left ankle ligament injury, currently in a boot with partial weightbearing. 6. Severe nonischemic cardiomyopathy, ejection fraction of 20% to 25%.   DISCHARGE HOME MEDICATIONS:  1. Losartan 50 mg p.o. daily.  2. Simvastatin 20 mg p.o. at bedtime.  3. Toprol 50 mg p.o. daily.  4. Ranolazine 500 mg p.o. b.i.d.  5. Aspirin 81 mg p.o. daily.  6. Hydroxyzine 25 mg p.o. q.8 hours p.r.n.  7. Protonix 40 mg p.o. daily.  8. Lisinopril 5 mg p.o. daily.  9. Renvela 800 mg tablets 3 tablets orally 3 times a day with meals.   DISCHARGE DIET: Renal diet.   DISCHARGE ACTIVITY: As tolerated.    FOLLOWUP INSTRUCTIONS:  1. Nephrology followup for dialysis as scheduled.  2. PCP followup in 1 to 2 weeks and will need MRI of Vanessa C-spine for radicular symptoms.  3. Vascular followup for fistulogram for bleeding around Vanessa fistula site.  4. Home health physical therapy for Vanessa left ankle.   LABS AND IMAGING STUDIES PRIOR TO DISCHARGE:  Sodium 130, potassium 5.6, chloride 97, bicarbonate 22, BUN 29, creatinine 6.13, glucose 84 and calcium of 7.3.  CT of Vanessa head without contrast showing no acute intracranial abnormality. Old bilateral small infarcts noted in parietal and frontal regions with encephalomalacia.  Chest x-ray showing  stable appearance of severe cardiomegaly, no evidence pulmonary edema or infiltrate.  Echocardiogram showing LV ejection fraction is 20% to 25%, severely decreased systolic function of Vanessa left ventricle and severe tricuspid regurgitation and severe mitral regurgitation, moderately elevated pulmonary pressures. WBC 5.3, hemoglobin 11.0, hematocrit 33.4, platelet count 188.   BRIEF HOSPITAL COURSE:  1. [name] is a 54 year old African-American female with past medical history of end-stage renal disease, on hemodialysis, history of CVA with residual left-sided weakness, who had her dialysis this past Wednesday, on 05/10/2013, because of holiday schedule. However, after dialysis, [name] went home and had extensive bleeding from her fistula at Vanessa access site. They applied a lot of pressure. It did not stop, so [name] presented to [facility] ER. [name] had an outpatient procedure done recently on her fistula for a stenosis. She has not had any further bleeding while she was here. Hemoglobin was stable. She was advised to follow up with vascular as an outpatient for a fistulogram.  2. Acute on chronic left-sided weakness with prior history of stroke, with residual left-sided weakness. [name] had 4/5 strength, both left upper and lower extremity. Minimal limitation of left lower extremity from Vanessa recent ankle surgery. She ambulated well with physical therapist, who recommended home health physical therapy. Her symptoms of Vanessa left arm were more radicular symptoms. She was seen by neurologist, Dr. Irish Elders, who felt this is less likely to be stroke, and [name] was given Vanessa option of outpatient followup, which she wanted to be done as an outpatient, so MRI and carotid Dopplers  were cancelled. CT head did not show any acute changes. [name] was discharged home on aspirin and her home medications.   3. Hypertension. Home medications were continued without any changes.  4. End-stage renal  disease, on hemodialysis on Tuesday, Thursday, Saturday. Last dialysis was on 05/10/2013. Next hemodialysis scheduled for 05/13/2013. Can follow up as an outpatient. Was also seen by nephrology while in [facility] hospital.  5. Her course has been otherwise uneventful in [facility] hospital.   DISCHARGE CONDITION: Stable.   DISCHARGE DISPOSITION: Home.   TIME SPENT ON DISCHARGE: 40 minutes.   ____________________________ Gladstone Lighter, MD rk:lb D: 05/12/2013 11:13:27 ET T: 05/12/2013 11:49:15 ET JOB#: [id number]  cc: Gladstone Lighter, MD, <Dictator> Munsoor Lilian Kapur, MD Mikeal Hawthorne. Brynda Greathouse, MD Gladstone Lighter MD ELECTRONICALLY SIGNED 06/01/2013 7:23

## 2014-10-05 NOTE — Op Note (Signed)
PATIENT NAME:  [name], [name] MR#:  [id number] DATE OF BIRTH:  1960-12-21  DATE OF PROCEDURE:  11/09/2012  PREOPERATIVE DIAGNOSES: 1.  Poorly functioning dialysis access.  2.  End-stage renal disease requiring hemodialysis.  POSTOPERATIVE DIAGNOSES: 1.  Poorly functioning dialysis access.  2.  End-stage renal disease requiring hemodialysis.   PROCEDURES PERFORMED: 1.  Left upper extremity brachial basilic fistulogram.  2.  Percutaneous transluminal angioplasty venous outflow.  3.  Placement of stent venous outflow for failed angioplasty.    PROCEDURE PERFORMED BY:  Dr. Delana Meyer.  SEDATION:  Precedex drip.   ACCESS:  A 7-French sheath, left brachiobasilic fistula, antegrade direction.   CONTRAST USED:  Isovue 40 mL.   FLUOROSCOPY TIME:  Approximately 4 minutes.   INDICATIONS:  [name] is a 54 year old woman who presents with worsening dialysis problems.  She is therefore undergoing evaluation for possible revision.  The risks and benefits are reviewed.  All questions answered.  The patient agrees to proceed.   DESCRIPTION OF PROCEDURE:  The patient is taken to the special procedure suite, placed in the supine position.  After adequate sedation is achieved, she is positioned supine with her left arm extended palm upward.  The left arm is prepped and draped in a sterile fashion.  1% lidocaine is infiltrated in the soft tissues overlying the fistula itself and access to the fistula is obtained with a micropuncture needle, microwire followed by micro sheath, J-wire followed by a 6-French sheath.  Hand injection of contrast is then used to demonstrate the fistula as well as the central anatomy.  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  The previously placed stent in the venous outflow now demonstrates a proximal and distal stenosis.  Pseudoaneurysm is again noted and patency of the arterial anastomosis is identified.   Three thousand units of  heparin is given and a Magic torque wire is negotiated through the strictures at the venous outflow and a 7 x 6 balloon is used to dilate the two areas.  Inflation is to 14 atmospheres for 1 minute.  Follow-up venography demonstrates minimal improvement and therefore an 8 x 70 flaired stent is opened onto the field.  It is advanced over the wire, after removing the 6-French sheath and deployed across the previously stented segment extending it more proximally by approximately 2 to 3 cm.  Unfortunately, the stent as it was released from the delivery system did jump forward and therefore a 9 x 2 balloon was advanced over the wire, placed just in front of the stent, inflated to nominal and then used to pull the stent back into position.  After several attempts, the stent is once again positioned exactly as described approximately 2 to 3 cm more proximally and then covering the previously stented segment as well.  The 9 x 2 balloon is then used to dilate the segment serially.  Inflations are for 30 seconds to 14 atmospheres.  Follow-up angiography demonstrates complete resolution of the venous outflow stenoses.  By palpation, the fistula itself now has significantly less pulsatility and significantly improved thrill.  A pursestring suture is placed around the 7-French sheath which had been inserted after the delivery system for the flaired stent and it was removed.  The patient tolerated the procedure well and there were no immediate complications.   INTERPRETATION:  Initial views demonstrate the fistula has the previously noted pseudoaneurysmal area as well as the area identified centrally that is stenotic with extensive collaterals.  Both  just in front and just proximal to the previously placed venous outflow stent.  There is now greater than 80% stenosis and therefore this is dilated.  Subsequently, a flair stent is placed as noted above and postdilated to 9 mm to maximally extend it.   SUMMARY:  Successful  salvage of left brachiobasilic fistula as described above.    ____________________________ Katha Cabal, MD ggs:ea D: 11/09/2012 18:20:52 ET T: 11/10/2012 06:36:44 ET JOB#: [id number]  cc: Katha Cabal, MD, <Dictator> Katha Cabal, MD Katha Cabal MD ELECTRONICALLY SIGNED 11/16/2012 11:03

## 2014-10-05 NOTE — H&P (Signed)
PATIENT NAME:  [name], SELMAN MR#:  [id number] DATE OF BIRTH:  August 25, 1960  DATE OF ADMISSION:  09/11/2012  REFERRING PHYSICIAN: Dr. Benjaman Lobe.  PRIMARY CARE PHYSICIAN: Dr. Brynda Greathouse.   CHIEF COMPLAINT: Swelling in the legs and shortness of breath.   HISTORY OF PRESENT ILLNESS: The patient is a 54 year old African American female with history of chronic systolic CHF with ejection fraction of about 25% per last count in 05/2012 with multivessel CAD, end-stage renal disease on dialysis Tuesdays, Thursdays, Saturdays who went to dialysis as scheduled yesterday. The patient currently is accompanied by her daughter and mother. Apparently, the patient drank 5 to 6 bottles of 16 ounce waters yesterday as she felt excessively thirsty. The day before that, she had episodes of nausea and no vomiting without diarrhea or abdominal pain, which was subsiding yesterday, but she felt more thirsty, drank the excessive amounts of water and came in today for volume overload. Hospitalist services were contacted for evaluation and management. Family also stated that yesterday she was disoriented, but today that has significantly improved. When asked to elaborate, they state that she was giving wrong answers when they were asking questions. She has elevated BNP of about 147,000 and mild bump in troponin of 0.07.   PAST MEDICAL HISTORY: Hypertension, CAD, status post cath as above, history of stroke in the past, history of end-stage renal disease and dialysis Tuesday, Thursday, Saturday, non-ST elevation myocardial infarction and history of chronic systolic congestive heart failure with EF of about 25%.   PAST SURGICAL HISTORY: Left upper extremity AV fistula.   SOCIAL HISTORY: Still smokes a cigarette a day, which family stated she smokes more. No alcohol or drug use.   FAMILY HISTORY: Father and mother with hypertension. Grandfather was dialysis dependent. Brother with hypertension.   ALLERGIES: MORPHINE, ADHESIVE AND  LASIX.   OUTPATIENT MEDICATIONS: Aspirin 81 mg daily, hydroxyzine 25 mg once a day, isosorbide mononitrate 30 mg extended-release 1 tab a day, losartan 50 mg daily and metoprolol succinate 50 mg extended-release 1 tab daily.   REVIEW OF SYSTEMS:   CONSTITUTIONAL: No fever, fatigue or weakness.  EYES: No blurry vision or double vision.  ENT: No tinnitus, hearing loss or snoring.  RESPIRATORY: She has some shortness of breath and some orthopnea. No cough, wheezing, or COPD.  CARDIOVASCULAR: Denies chest pain. Positive for swelling in the legs.  GASTROINTESTINAL: The patient had nausea and vomiting, which went on for about a day and resolved yesterday without diarrhea or abdominal pain. A little constipated otherwise. No abdominal pain.  GENITOURINARY: Denies dysuria or hematuria. Produces less and less urine, almost next to nothing now.  ENDOCRINE: No polyuria or nocturia.  HEMATOLOGY: No anemia or easy bruising.  SKIN: Denies any rashes.  MUSCULOSKELETAL: Denies arthritis or gout.  NEUROLOGIC: History of stroke and some disorientation yesterday.  PSYCHIATRIC: No anxiety or insomnia.   PHYSICAL EXAMINATION:  VITAL SIGNS: Temperature on arrival 97.8, pulse 100, respiratory rate 18, blood pressure 153/99, oxygen saturation on arrival was 99% to 100% on room air.  GENERAL: The patient is a well-developed [place] American female lying in bed in no obvious distress.  HEENT: Normocephalic, atraumatic. Pupils are equal and reactive. Anicteric sclerae. Moist mucous membranes.  NECK: Supple. No thyroid tenderness. Distended neck veins. Positive JVD.  CARDIOVASCULAR: S1, S2 regular rate and no murmurs, rubs, or gallops.  LUNGS: Scattered crackles, but good air entry otherwise. No rales.  ABDOMEN: Soft, nontender, nondistended. Positive bowel sounds in all quadrants.  EXTREMITIES: 2+ edema to  below the knees. The patient has left upper extremity fistula with bruit.  SKIN: No obvious rashes.   NEUROLOGIC: Cranial nerves II through XII appears to be grossly intact. Strength is 5 out of 5 in all extremities. Sensation is intact to light touch.  PSYCHIATRIC: Awake, alert, oriented x 3, conversant and pleasant.   LABORATORY DATA: TSH 2.42. Troponin 0.07. LFTs: Albumin is 3.1, otherwise within normal limits. BUN 20, creatinine 5.83, sodium 136, potassium 5. BNP elevated at 147,939. Glucose 101. WBC 4.7, hemoglobin 11.4, platelets 160.   EKG: Normal sinus rhythm, possible left atrial enlargement. I do not see any acute ST elevations or depressions.   CT of the head without contrast showing findings within the right and left frontal regions along with vertex exam. There is appearance of chronic lacunar infarcts with subsequent encephalomalacia. These findings are appreciated on the previous study as well. There are findings consistent with changes of microangiopathy. No evidence of acute abnormalities. X-ray of the chest was done and is not up yet.   ASSESSMENT AND PLAN: We have a 55 year old female with systolic congestive heart failure, ejection fraction about 25%, multivessel coronary artery disease, status post stenting and cath a few months ago, dialysis dependent renal failure with last dialysis yesterday, history of stroke and hypertension, who presents with fluid overload and likely acute on chronic systolic congestive heart failure. The patient has elevated BNP, swelling in the legs and some shortness of breath and orthopnea. She is pretty much anuric now and would ask nephrology to come see her and evaluate for possible dialysis; however, she maintains good saturations now. Would continue her losartan, beta blocker and nitroglycerin patch will be applied. The patient likely developed congestive heart failure secondary to dietary noncompliance and excessive water intake in the last 24 hours per her. The patient has a history of noncompliance in the past, of note. She has had a catheterization  recently and has no chest pain and would just continue her outpatient medications. In regards to her elevated blood pressure, we would continue her outpatient medications. She does still smoke and she was counseled for 3 minutes, but appears to understate her tobacco usage. She is not currently on a statin and she does have a history of stroke and coronary artery disease and we would start one now. Of note, she was discharged on simvastatin previously and would continue that. We will start her on heparin for deep vein thrombosis prophylaxis.   CODE STATUS: The patient is full code.   TOTAL TIME SPENT: 55 minutes.    ____________________________ Vivien Presto, MD sa:aw D: 09/11/2012 11:06:14 ET T: 09/11/2012 11:23:17 ET JOB#: [id number]  cc: Vivien Presto, MD, <Dictator> Mikeal Hawthorne. Brynda Greathouse, MD Vivien Presto MD ELECTRONICALLY SIGNED 09/15/2012 22:13

## 2014-10-05 NOTE — Consult Note (Signed)
Pt seen and examined. Please see [name]'s notes. Chronic nausea/vomiting and dysphagia. Will eventually need EGD (possibly Fri?) once cardiology sees patient. Drug screen ordered due to polysubstance abuse hx. If positive for cocaine, then EGD will need to be postponed. Will follow. Thanks.  Electronic Signatures: Verdie Shire (MD)  (Signed on 03-Sep-14 15:39)  Authored  Last Updated: 03-Sep-14 15:39 by Verdie Shire (MD)

## 2014-10-05 NOTE — Op Note (Signed)
PATIENT NAME:  [name], [name] MR#:  [id number] DATE OF BIRTH:  14-Jun-1961  DATE OF PROCEDURE:  05/03/2013  PREOPERATIVE DIAGNOSES: 1.  End-stage renal disease.  2.  Poorly functioning left arm arteriovenous fistula.  3.  Hypertension.  4.  History of stroke.   POSTOPERATIVE DIAGNOSES: 1.  End-stage renal disease.  2.  Poorly functioning left arm arteriovenous fistula.  3.  Hypertension.  4.  History of stroke.   PROCEDURES: 1.  Ultrasound guidance for vascular access, left brachial basilic AV fistula.  2.  Left upper extremity fistulogram and central venogram.  3.  Percutaneous transluminal angioplasty of basilic vein stent with 8 mm diameter angioplasty balloon.   SURGEON: Leotis Pain, M.D.   ANESTHESIA: Local with moderate conscious sedation.   FLUOROSCOPY TIME: Approximately 2 minutes and 20 mL of contrast were used.   Blood loss minimal.   INDICATION FOR PROCEDURE: This is a 54 year old African American female with end-stage renal disease. We were asked to assess her dialysis access. A noninvasive study had shown stenosis in her outflow. She is brought in for angiography for further evaluation and potential treatment. Risks and benefits are discussed and informed consent is obtained.   DESCRIPTION OF PROCEDURE: The patient is brought to the vascular interventional radiology suite. The left upper extremity was sterilely prepped and draped, and a sterile surgical field was created. The fistula was accessed in the aneurysmal portion of the fistula. This was done under direct ultrasound guidance without difficulty with a micropuncture needle, and permanent image was recorded. A micropuncture wire and sheath were then placed. I then up sized to a 6-French sheath. Imaging showed what appeared to be a moderate stenosis at the leading edge of the basilic vein stent between the aneurysmal portion of the fistula and the stent itself. This appeared to be in the 50% to 60% range. The remainder  of the central venous circulation, the stent and the axillary and subclavian veins were all patent. I crossed the lesion without difficulty with a Magic Torque wire. I treated this lesion with an 8 mm diameter angioplasty balloon inflated to burst pressure. A waist was taken, which resolved with angioplasty. While the balloon was inflated, imaging was performed to evaluate the arterial anastomosis. There was mild degrees of stenosis in the swing portion of the perianastomotic region of the basilic vein, and this did not appear to be more than 30% to 40%. At this point, after angioplasty of the outflow portion was successful, I elected to terminate the procedure. The sheath was removed. A 4-0 Monocryl pursestring suture was placed. Pressure was held. Sterile dressing was placed. The patient tolerated the procedure well and was taken to the recovery room in stable condition.      ____________________________ Algernon Huxley, MD jsd:dmm D: 05/03/2013 11:08:59 ET T: 05/03/2013 11:27:23 ET JOB#: [id number]  cc: Algernon Huxley, MD, <Dictator> Algernon Huxley MD ELECTRONICALLY SIGNED 05/22/2013 9:19

## 2014-10-06 NOTE — Consult Note (Signed)
PATIENT NAME:  [name], [name] MR#:  [id number] DATE OF BIRTH:  1960-12-17  DATE OF CONSULTATION:  04/11/2014  REFERRING PHYSICIAN:   CONSULTING PHYSICIAN:  Lyndsie Wallman D. Alick Lecomte, MD  PRIMARY CARE PHYSICIAN: Nicky Pugh, MD  INDICATION: Hypotension and chest pain   HISTORY OF PRESENT ILLNESS: The patient is a 54 year old African American female with history of end-stage renal disease, systolic cardiomyopathy ischemic in nature, coronary artery disease, recent coronary bypass surgery, hepatitis, history of CVA, bipolar disease, recently had bypass surgery at [facility] for recurrent  chest pain. Developed pericardial effusion postop, was treated medically. The patient had recurrent pain symptoms of unclear etiology with history of drug-seeking behavior, came to the Emergency Room with hypotension and chest pain so was admitted. Repeat echocardiogram shows large posterior pericardial effusion. No fever. Cardiac enzymes are unremarkable. Complained of shortness of breath and weakness requiring supplemental oxygen. Had trouble lying flat. Because of chronic respiratory failure, leg edema, chest pain and pericardial effusion, cardiology consultation was recommended.   PAST MEDICAL HISTORY: Coronary artery disease, ischemic cardiomyopathy, congestive heart failure. Left-sided CVA, bipolar,  , smoking, hyperlipidemia, and end-stage renal disease.   PAST SURGICAL HISTORY: AV fistula, coronary artery bypass graft.  SOCIAL HISTORY: Smokes. Denies alcohol consumption.  FAMILY HISTORY: Prostate cancer, hypertension.   ALLERGIES: MORPHINE, ADHESIVE TAPE, LASIX.  MEDICATIONS: Coumadin 0.5 mg once a day, sevelamer 800 mg 2 times a day, Sensipar 30 mg in the evening, senna 1 tablet once a day, ranitidine 150 mg at bedtime, Protonix 40 mg twice a day, hydroxyzine 50 mg twice a day, hydrochlorothiazide once a day, gabapentin 300 mg once a day Tuesday, Thursday, and Saturday before dialysis, Coreg 3.125 twice a day,  atorvastatin 80 mg a day, aspirin 81 mg a day,a day.  REVIEW OF SYSTEMS: The patient complains of weakness, fatigue, shortness of breath, leg edema. Denies blackout spells, syncope,  fever, chills, sweats, weight loss or weight gain. No bright red blood per rectum. No vision change or hearing change. Denies  . She has had persistent chest pain  tachycardia.  PHYSICAL EXAMINATION: VITAL SIGNS: Blood pressure initially was 160/40. Follow-up blood pressures were 80 systolic. Temperature was normal. Oxygen sat 95 on 2 liters.  HEENT: Normocephalic, atraumatic. Pupils equal and reactive to light.  NECK: Supple. No significant JVD  LUNGS: Bilateral rhonchi. Mild rales and crepitation. No wheezing. Adequate air movement.   HEART: Rate of 90 and regular. Systolic ejection murmur at the left sternal border. Positive S3. Soft S4. PMI displaced. ABDOMEN: Benign. Positive bowel sounds. No rebound, guarding or tenderness.  EXTREMITIES:WNL NEUROLOGIC: Intact.  SKIN: Normal.  DIAGNOSTIC DATA: Glucose 180, BUN 66, creatinine  , sodium 135, potassium 3.5, chloride 102, bicarb 16. ETOH less than 3. Anion gap 17. Lipase 929. Magnesium 1.9, INR 1.6. Troponin 0.08 and 0.05. White count 7, hemoglobin 10, platelet count 1.5.   EKG: Sinus rhythm, rate of 90. Non-specific ST-T wave changes.   ASSESSMENT:  1.  Hypotension. 2.  End-stage renal disease. 3.  Shortness of breath. 4.  Coronary artery disease. 5.  Congestive heart failure. 6.  Cardiomyopathy. 7.  Paroxysmal atrial fibrillation. 8.  History of cerebrovascular accident. 9.  Bipolar disorder.   PLAN: 1.  Agree with admit. Try to correct blood pressure. Mild fluid challenge. Evaluate for possible sepsis. Re-evaluate pericardial effusion postop from surgery and/or possibility of uremia because of end-stage renal disease. 2.  Continue anticoagulation for atrial fibrillation and history of cerebrovascular accident. Deep venous thrombosis prophylaxis.  Continue  rate control for atrial fibrillation and tachycardia.  3.  Continue dialysis therapy for endstage renal disease. She is a Tuesday, Thursday patient. Will plan the patient for dialysis unless she has significant uremia with pericardial effusion that is worsening.  4.  For hypokalemia consider Kayexalate with potassium of 5.3. 5.  Known coronary artery disease. Continue aspirin and Coumadin and Coreg. 6.  Cardiomyopathy. Continue Coreg therapy as necessary. Not able to tolerate an ACE because of low blood pressure.  7.  If the patient continues to have significant symptoms   pericardial effusion, would consider transfer to [facility] for further evaluation  I do not believe the patient has tamponade at this point. Also because of recent bypass surgery, I am worried about Dressler syndrome and/or loculated pericardial effusion postsurgical procedure. Must also be concerned about sepsis, undiagnosed, with  and severe hypertension. Will continue to follow the patient  [place] cardiothoracic surgery.  ____________________________ Loran Senters Clayborn Bigness, MD ddc:sb D: 04/12/2014 08:47:00 ET T: 04/12/2014 09:21:05 ET JOB#: [id number]  cc: Homar Weinkauf D. Clayborn Bigness, MD, <Dictator> Yolonda Kida MD ELECTRONICALLY SIGNED 05/14/2014 9:52

## 2014-10-06 NOTE — Op Note (Signed)
PATIENT NAME:  [name], [name] MR#:  [id number] DATE OF BIRTH:  09/10/1960  DATE OF PROCEDURE:  10/08/2013  PREOPERATIVE DIAGNOSES:  1. Acute Non-ST segment elevation myocardial infarction. 2. Decrease in responsiveness.  3. End-stage renal disease.  4. Hepatitis C. 5. Needing more secure IV access.   POSTOPERATIVE DIAGNOSES:  1. Acute Non-ST segment elevation myocardial infarction. 2. Decrease in responsiveness.  3. End-stage renal disease.  4. Hepatitis C. 5. Needing more secure IV access.   PROCEDURE PERFORMED:  Insertion of right internal jugular triple-lumen catheter with ultrasound guidance for central venous access.  PROCEDURE PERFORMED BY:  Melvyn Neth, physician assistant, and Dr. Leotis Pain.   ANESTHESIA: 1% lidocaine  and 4 mg of Versed.   ESTIMATED BLOOD LOSS: Minimal.   INDICATION FOR THE PROCEDURE: The patient is in the intensive care unit and is critically ill requiring parenteral medication. She, therefore, requires adequate IV access.   DESCRIPTION OF THE PROCEDURE: The patient is positioned supine. The right neck is prepped and draped in a sterile surgical fashion. Ultrasound is placed in a sterile sleeve. Jugular vein is identified. It is echolucent and compressible, indicating patency. Images recorded for permanent record. Under direct visualization, the jugular vein is accessed. A J-wire is advanced without difficulty.  A counterincision is made. Dilator is passed over the wire, and the triple lumen catheter is advanced without problem. All 3 lumens aspirate and flush easily. The catheter  is secured to the skin with 2-0 silk and a sterile dressing, including a Biopatch, is applied. Chest x-ray ordered stat and is pending.   ____________________________ Marin Shutter. Myrtle Barnhard, PA-C cnh:dd D: 10/08/2013 16:27:29 ET T: 10/09/2013 05:09:25 ET JOB#: [id number]  cc: Marin Shutter. Krishay Faro, PA-C, <Dictator> [place] PA ELECTRONICALLY SIGNED 10/09/2013 17:25

## 2014-10-06 NOTE — H&P (Signed)
PATIENT NAME:  [name], [name] MR#:  [id number] DATE OF BIRTH:  [birth year]  DATE OF ADMISSION:  02/09/2014  REFERRING PHYSICIAN: Dr.  Lurline Hare.   PRIMARY CARE PHYSICIAN: Dr. Nicky Pugh.   NEPHROLOGY:  Dr. Murlean Iba.   CARDIOLOGY: [facility].   CHIEF COMPLAINT: Chest pain.   HISTORY OF PRESENT ILLNESS: This is a 54 year old female with known history of interstitial disease on hemodialysis, with multiple admissions in the past secondary to chest pain, with recent cardiac catheterization in April of this year showing multiple vessel disease mainly stent in stent lesions, with recommendation of medical management. With recent admission on 08/24 for evaluation of chest pain, with negative, a mildly elevated troponin then which was suggestive of demand ischemia when seen by cardiology. The patient presents with complaints of chest pain, midsternal, nonradiating, tightness quality started at 6:30 p.m. yesterday. Reports it was on constant, resolved when EMS who gave aspirin and nitroglycerin, currently denies any chest pain. She reports some nausea, sweating, shortness of breath. Denies any vomiting, cough, palpitations, fever or chills. Patient was chest pain-free by presentation in the ED, her troponins were found to be elevated at 2.5. Her baseline may troponins couple of days ago ranging around 0.12 to  0.14. As well, the patient had worsening T wave inversion in lateral leads. This is currently including V5 which did not include in the past. The patient was non-STEMI in the ED and started on heparin drip and nitro paste. Currently, the patient is sleeping comfortably.   Upon my exam, the patient was sleepy, had pinpoint pupils bilaterally, but she is arousable, communicative, she denies any drug abuse, but she is on hydroxyzine at home which she reports she did take this evening. The patient does not have any focal deficits, moves all extremities, coherent. Marland Kitchen   PAST MEDICAL  HISTORY:  1. Coronary artery disease, status post multiple stents in the past.  2. End-stage renal disease on hemodialysis Tuesday, Thursday, Saturday.  3. History of hepatitis C.  4. History of CVA in the past.   PAST SURGICAL HISTORY: AV graft, AV fistula, cardiac catheterization and neck surgery.   SOCIAL HISTORY: The patient smokes cigarettes. She reports 1 to 2 cigarettes per day. Lives at home. No alcohol. Denies any illicit drug use.   FAMILY HISTORY: Significant for prostate cancer and hypertension.   ALLERGIES: MORPHINE, ADHESIVES AND LATEX.    HOME MEDICATIONS:  1. Aspirin 81 mg daily.  2. Losartan 50 mg at bedtime. 3. Imdur 60 mg daily. 4. Hydroxyzine 50 mg b.i.d.  5. Metoprolol succinate 50 mg daily. 6. Ranitidine 150 mg daily.  7. Sensipar 30 mg oral daily 7 mL, 800 mg 3 tablets 3 times a day before meals.  8. Pantoprazole 40 mg daily. 9. Hydralazine 50 mg oral 3 times a day.   REVIEW OF SYSTEMS: CONSTITUTIONAL: Denies fever, chills, fatigue, weakness.  EYES: Denies blurry vision, double, inflammation.  EARS, NOSE AND THROAT: Denies tinnitus, ear pain, hearing loss, epistaxis. No difficulty swallowing.  RESPIRATORY: Denies any cough, hemoptysis, chronic obstructive pulmonary disease reports shortness of breath.  CARDIOVASCULAR: Reports chest pain. Denies syncope, palpitation, edema.  GASTROINTESTINAL: Reports nausea, denies vomiting or diarrhea. Denies hematemesis.  GENITOURINARY: The patient aneuric, denies renal colic.  ENDOCRINE: Denies polyuria, polydipsia, heat or cold intolerance.  HEMATOLOGY: Denies anemia, easy bruising, bleeding diathesis.  INTEGUMENT: Denies acne, rash, or skin lesion.  MUSCULOSKELETAL: Denies any swelling, gout, cramps, arthritis.  NEUROLOGIC: Denies any tremors, vertigo, ataxia, migraine.  Reports history of CVA in the past.  PSYCHIATRIC: Denies anxiety, insomnia, depression.   PHYSICAL EXAMINATION:  VITAL SIGNS: Temperature 98.5,  pulse 97, respiratory rate 22, blood pressure 129/93, sating 95% on room air.  GENERAL: Well-nourished female, sleeping comfortable in bed, in no apparent distress.  HEENT: Head normocephalic. Pupils reactive to light and myotic bilaterally. Pink conjunctivae. Anicteric sclerae. Moist oral mucosa. No oral thrush. No pharyngeal erythema. No nasal discharge.  NECK: Supple. No thyromegaly. No JVD. No carotid bruits. No lymphadenopathy.  CHEST: Good air entry bilaterally. No wheezing, rales or rhonchi. No use of accessory muscle.  CARDIOVASCULAR: S1, S2 heard. No rubs, murmur or gallops. PMI nondisplaced. Regular rate and rhythm.  ABDOMEN: Soft, nontender, nondistended. Bowel sounds present.  EXTREMITIES: No edema. No clubbing. No cyanosis. Pedal pulses +2 bilaterally. Has AV fistula in the left upper extremity with good bruits heard.  MUSCULOSKELETAL: No joint effusion or erythema.  SKIN: Warm and dry.   PERTINENT LABORATORY DATA:  Glucose 131, BUN 45, creatinine 5.4, sodium 135, potassium 4.9, chloride 100, CO2 23. Troponin 2.5, CK-MB 3.7. White blood cells 6.6, hemoglobin 9.5, hematocrit 29.9, platelets 154,000.  INR 1.1.   EKG showing normal sinus rhythm at 96 beats per minute with T wave inversion within the lateral leads now include worsening T wave inversion in the lateral leads involving V6 as well which was not present in the past.   ASSESSMENT AND PLAN:  1. Chest pain with elevated troponin. This is due to non-ST-elevated myocardial infarction, the patient's troponin is much higher than her baseline, which we cannot relate to end-stage renal disease event, so she will be started on heparin drip. She was already given 324 mg of aspirin by ED staff. Chest pain-free. We will add, nitro paste. We will resume her back on her home medication including beta blockers, ACE inhibitors, statin, nitroglycerin and we will start her on heparin drip as well, we will try to admit her to telemetry, cycle her  cardiac enzymes and consult cardiology, [facility] cardiology.  2. End-stage renal disease. We will consult nephrology service. We will resume her back on her home medications including Sevelamer and Sensipar. We will put her on renal dialysis diet.  3. Hypertension, controlled. We will get her back on her home medications.  4. Lethargic, the patient actually right now she is awake, back to his baseline. I attempted Narcan initially with no change. This is most likely related to her hydroxyzine, which we will hold now. We will check blood for drugs of abuse.  5. Deep vein thrombosis prophylaxis. The patient is full dose anticoagulation. We will keep on sequential compression device as well.   CODE STATUS: Full code.   TOTAL TIME SPENT ON ADMISSION AND PATIENT CARE: 55 minutes.     ____________________________ Albertine Cella, MD dse:JT D: 02/09/2014 02:17:34 ET T: 02/09/2014 03:17:46 ET JOB#: [id number]  cc: Albertine Caci, MD, <Dictator> Saree Krogh Graciela Husbands MD ELECTRONICALLY SIGNED 02/10/2014 4:10

## 2014-10-06 NOTE — H&P (Signed)
PATIENT NAME:  [name], [name] MR#:  [id number] DATE OF BIRTH:  [birth year]  DATE OF ADMISSION:  04/10/2014  PRIMARY CARE PHYSICIAN:  [name], MD  REFERRING PHYSICIAN:  Yetta Numbers. Karma Greaser, MD  CHIEF COMPLAINT:  Hypotension and chest pain.   HISTORY OF PRESENT ILLNESS:  This is a 54 year old African-American female with a history of ESRD  and CAD, who presented to the ED with the above chief complaint. The patient is alert, awake, oriented, and in no acute distress. The patient said she has had hypotension for the past couple of days. In addition, the patient had chest pain in the middle part of the chest around the surgical scar of the CABG. The patient had NSTEMI and had a CABG one month ago. In addition, the patient complains of shortness of breath, but the patient denies any fever or chills, but has cough and orthopnea. The patient said that she cannot lie flat due to shortness of breath. She also complains of bilateral leg edema. The patient is on oxygen 2 liters at home.   PAST MEDICAL HISTORY:   1.  CAD status post stent in the past and CABG last month.  2.  ESRD on hemodialysis on Tuesday, Thursday, and Saturday.  3.  History of hepatitis. 4.  History of CVA in the past.   PAST SURGICAL HISTORY:  AV fistula and a graft and CABG.   SOCIAL HISTORY:  The patient smokes 1 to 2 cigarettes per day. According to previous documents, the patient denies any smoking, drinking, or illicit drugs.   FAMILY HISTORY:  Prostate cancer and hypertension.   ALLERGIES:   1.  MORPHINE.  2.  ADHESIVE.  3.  LATEX.  HOME MEDICATIONS:  Warfarin 1 mg tablets 0.5 tablets once a day, sevelamer 800 mg p.o. 3 tablets t.i.d., Sensipar 30 mg p.o. in the evening, senna oral tablets 1 tablet once a day, ranitidine 150 mg p.o. at bedtime, Protonix 40 mg p.o. b.i.d., hydroxyzine/hydrochlorothiazide 50 mg p.o. b.i.d., gabapentin 300 mg p.o. daily once a day on Tuesday, Thursday, and Saturday before dialysis,  Coreg 3.125 mg p.o. b.i.d., atorvastatin 40 mg p.o. daily, aspirin 81 mg p.o. at bedtime, amiodarone 200 mg p.o. daily.   REVIEW OF SYSTEMS: CONSTITUTIONAL:  The patient denies any fever or chills. No headache or dizziness, but has weakness.  EYES:  No double vision or blurred vision. EARS, NOSE, AND THROAT:  No postnasal drip, slurred speech, or dysphagia.  CARDIOVASCULAR:  Positive for chest pain. No palpitations, but has orthopnea. No nocturnal dyspnea, but has leg edema. PULMONARY:  Positive for cough, sputum, shortness of breath, but no hemoptysis or wheezing.  GASTROINTESTINAL:  No abdominal pain, nausea, vomiting, or diarrhea. No melena or bloody stool.  GENITOURINARY:  No dysuria, hematuria, or incontinence.  SKIN:  No rash or jaundice.  NEUROLOGY:  No syncope, loss of consciousness, or seizure.  ENDOCRINE:  No polyuria, polydipsia, or heat or cold intolerance.  HEMATOLOGIC:  No easy bruising or bleeding.   PHYSICAL EXAMINATION: VITAL SIGNS:  Temperature 98.2, blood pressure was 60/37 and after normal saline bolus in the ED increased to 89/74, pulse 93, oxygen saturation 100% on oxygen.  GENERAL:  The patient is alert, awake, oriented, and in no acute distress.  HEENT:  Pupils are round, equal, and reactive to light and accommodation. Moist oral mucosa. Clear pharynx.  NECK:  Supple. Mild JVD. No carotid bruit. No lymphadenopathy.  PULMONARY:  Bilateral air entry. No wheezing or rales. No  use of accessory muscles to breathe.  ABDOMEN:  Soft. No distention or tenderness. No organomegaly. Bowel sounds present.  CHEST WALL:  The patient has tenderness around the surgical scar of the CABG.  EXTREMITIES:  Bilateral lower extremity edema, 1 to 2+. No clubbing or cyanosis. There is a wound dressing on the left lower extremity. Bilateral pedal pulses present.  SKIN:  No rash or jaundice.  NEUROLOGIC:  Alert and oriented x 3. No focal deficit. Power is 5/5. Sensation is intact.   LABORATORY  DATA AND IMAGING:  Glucose 80, BUN 66, creatinine 8.75, sodium 135, potassium 5.3, chloride 102, bicarbonate 16. Ethanol level less than 3. Anion gap 17. Lipase 929. Magnesium 1.9. INR 1.6. Troponin 0.08 and 0.05. WBC 7.1, hemoglobin 10.4, platelets 145.   EKG showed sinus rhythm with first degree AV block at 91 bpm with RBBB.   IMPRESSIONS: 1.  Hypotension of unclear etiology. We will hold Coreg and give normal saline IV. The patient was treated with normal saline bolus 1000 mL. The patient's blood pressure increased from 60 to 89. We will continue normal saline 100 mL per hour.  2.  May discontinue IV fluid tomorrow morning.  3.  Chest pain. Initial troponin level is mildly elevated, possibly due to end-stage renal disease, but the second troponin decreased to 0.05 and no evidence of acute non-ST segment elevation myocardial infarction, but we will ask for cardiology consult for recommendation.  4.  End-stage renal disease, we will request a nephrology consult.  5.  Hyperkalemia, we will give Kayexalate p.o. stat and follow up her BMP tomorrow morning.   6.  Elevated lipase, etiology is unclear, but the patient has no symptoms of abdominal pain, nausea, or vomiting. We will follow up lipase level.  7.  Coronary artery disease, we will continue aspirin and Coumadin and follow up INR level.   I discussed the patient's condition and plan of treatment with the patient and the patient's mother.   TIME SPENT:  About 56 minutes.   ____________________________ Demetrios Loll, MD qc:nb D: 04/10/2014 23:20:26 ET T: 04/11/2014 01:26:08 ET JOB#: [id number]  cc: Demetrios Loll, MD, <Dictator> Demetrios Loll MD ELECTRONICALLY SIGNED 04/11/2014 16:25

## 2014-10-06 NOTE — Discharge Summary (Signed)
PATIENT NAME:  [name], [name] MR#:  [id number] DATE OF BIRTH:  03/26/1961  DATE OF ADMISSION:  12/29/2013. DATE OF DISCHARGE:  12/30/2013.  PRESENTING COMPLAINT: Chest pain.   DISCHARGE DIAGNOSES:  1.  Unstable angina.  2.  End-stage renal disease, on hemodialysis.  3.  History of coronary artery disease, status post stents in the past.  4.  Hypertension.   CODE STATUS: Full code.   MEDICATIONS:  1.  Losartan 50 mg p.o. daily.  2.  Metoprolol 50 mg extended release p.o. daily.  3.  Aspirin 81 mg daily.  4.  Protonix 40 mg daily.  5.  Sevelamer 800 mg 3 tablets 3 times a day.  6.  Prednisone 20 mg once a day before dialysis.  7.  Hydralazine 25 mg t.i.d.  8.  Gabapentin 300 mg p.o. daily before hemodialysis.  9.  Imdur 60 mg extended release p.o. daily.  10. Sensipar 30 mg p.o. daily.  11. Simvastatin 20 mg p.o. at bedtime.   FOLLOWUP:   1.  Dr. Brynda Greathouse in 1 to 2 weeks.  2.  Resume hemodialysis as an outpatient.   CONSULTS:   1.  Nephrology, Dr. Candiss Norse.  2.  Cardiology, Dr. Ubaldo Glassing.   BRIEF SUMMARY OF HOSPITAL COURSE: [name] is a 54 year old African American female with history of end-stage renal disease, on hemodialysis; CAD, status post stent in the past, comes in with:  1.  Unstable angina with borderline elevated troponins. The patient was admitted on the medical floor.  By the time the patient went up to the floor she was chest-pain free. Dr. Bethanne Ginger input is appreciated. No further work-up needed for him since the patient has been chest pain-free. She had recent cardiac catheterization in April 2015, which showed in RCA disease. Medical management was recommended. She was continued on aspirin, p.r.n. nitroglycerin, beta blockers, and losartan, long-acting Imdur. 2.  End-stage renal disease, her in-house dialysis was continued.  3.  History of CAD status post stent in the past. Last heart catheter was done in April 2015. She has chronically occluded right RCA. Medical  management was continued.  4.  Hypertension.  Blood pressure in acceptable range. Home meds were continued.  5.  DVT prophylaxis with subcutaneous heparin.   Hospital stay otherwise remained stable.   TIME SPENT: 40 minutes.     ____________________________ Hart Rochester Posey Pronto, MD sap:lt D: 12/31/2013 07:01:32 ET T: 12/31/2013 07:57:23 ET JOB#: [id number]  cc: Claude Swendsen A. Posey Pronto, MD, <Dictator> Mikeal Hawthorne. Brynda Greathouse, MD Ilda Basset MD ELECTRONICALLY SIGNED 01/09/2014 15:44

## 2014-10-06 NOTE — H&P (Signed)
PATIENT NAME:  [name], [name] MR#:  [id number] DATE OF BIRTH:  [birth year]  DATE OF ADMISSION:  11/25/2013  PRIMARY CARE PHYSICIAN: Dr. Nicky Pugh.   PRIMARY CARDIOLOGIST: Dr. Clayborn Bigness.   CHIEF COMPLAINT: Chest pain.   A 54 year old female with known history of end-stage renal disease on hemodialysis Tuesday, Thursday, Saturday. Reports she had hemodialysis without any complication last Saturday. Known history of coronary artery disease, status post stents, with recent admission April of this year with Non-STEMI where she had a cardiac catheter done by Dr. Ubaldo Glassing which did show chronic severe right coronary artery disease with recommendation of medical management. History of hypertension, CVA, hepatitis C. The patient presents to the hospital with complaints of chest pain, reports it happened at rest at evening, substernal,  reports it is similar to the chest pain when she presented a couple of months ago with a Non-STEMI. The patient reports that lasted for a few minutes, resolved without any intervention. By the time she came to the ED, chest pain was already gone. The patient's first troponin was 0.17, which is around her baseline. Repeat troponin after  four hours with significant elevation at 0.69. The patient was given aspirin. Hospitalist requested to admit for further evaluation for this chest pain given the fact her troponin went up.   PAST MEDICAL HISTORY: 1. Nonischemic cardiomyopathy.  2. Coronary artery disease, status post stent.  3. Hypertension.  4. Hyperlipidemia.  5. GERD.  6. Hepatitis C.  7. Cerebrovascular accident.  8. History of multidrug resistant organism.  9. End-stage renal disease on hemodialysis on Tuesday, Thursday, and Saturday.   PAST SURGICAL HISTORY: 1. Neck surgery.  2. Cardiac stent placement.  3. AV fistula in the left upper extremity.  4. C-section.   ALLERGIES: MORPHINE, ADHESIVE TAPE, LASIX.    HOME MEDICATIONS:  1. Sensipar 30 mg oral daily.   2. Prednisone 20 mg oral on hemodialysis days.  3. Pantoprazole 40 mg oral daily.  4. Metoprolol succinate 50 mg daily.  5. Losartan 50 mg daily.  6. Isosorbide mononitrate 60 mg daily.  7. Hydralazine 25 mg 3 times a day.  8. Gabapentin 300 mg oral daily.  9. Aspirin 81 mg daily.    PSYCHOSOCIAL HISTORY: Lives at home on disability. One pack lasts her for two weeks. No alcohol. No illicit drug use.   FAMILY HISTORY: Significant for hypertension, prostate cancer.   REVIEW OF SYSTEMS: CONSTITUTIONAL: The patient denies any fever, chills, fatigue, weakness.  EYES: Denies blurry vision, double vision or inflammation.  ENT: Denies tinnitus, ear pain, hearing loss, epistaxis.  RESPIRATORY: Denies cough, wheezing, hemoptysis, dyspnea.  CARDIOVASCULAR: Reports chest pain, currently resolved. Denies edema, arrhythmia, palpitations.  GASTROINTESTINAL: Denies nausea, vomiting, diarrhea, abdominal pain, hematemesis.  GENITOURINARY: Denies dysuria, hematuria, renal colic.  ENDOCRINE: Denies polyuria, heat or cold intolerance.  HEMATOLOGY: Denies anemia, easy bruising, bleeding diathesis.  INTEGUMENT: Denies acne, rash or skin lesion.  MUSCULOSKELETAL: Denies any gout, swelling, cramps. NEUROLOGICAL:  Denies any history of tremors, vertigo, ataxia. Reports history of CVA in the past.  PSYCHIATRIC: Denies anxiety, insomnia or depression.   PHYSICAL EXAMINATION: VITAL SIGNS: Pulse 96, respiratory rate 14, blood pressure 129/98, saturating 93% on room air.  GENERAL: Well-nourished female who looks comfortable in bed, in no apparent distress.  HEENT: Head atraumatic, normocephalic. Pupils equal, reactive to light. Pink conjunctivae. Anicteric sclerae. Moist oral mucosa.  NECK: Supple. No thyromegaly. No JVD.  CHEST: Good air entry bilaterally. No wheezing, rales or rhonchi.  CARDIOVASCULAR: S1,  S2 heard., No rubs, murmurs or gallops.  ABDOMEN: Soft, nontender, nondistended. Bowel sounds present.   EXTREMITIES: No edema. No clubbing. No cyanosis. Pedal and radial pulses felt bilaterally.  PSYCHIATRIC: Appropriate affect. Awake, alert x 3. Intact judgment and insight.   NEUROLOGIC: Cranial nerves grossly intact. Motor five out of five. No focal deficits.  MUSCULOSKELETAL: No joint effusion or erythema.   PERTINENT LABORATORY DATA: Glucose 83, BUN 28, creatinine 5.26, sodium 138, potassium 4.9, chloride 100, CO2 30, ALT 38, AST 65, alkaline phosphatase 138. Troponin: First one 0.17, repeat 0.69. White blood cell 5.5, hemoglobin 10.7, hematocrit 32.9, platelets 131, INR 1.3.  EKG showing sinus tachycardia at 103 beats per minute without significant T wave abnormalities once compared to most recent EKG.   ASSESSMENT AND PLAN: 1. Chest pain with elevated troponins. This is possibly due to for non-ST-elevated myocardial infarction, but the patient is currently chest pain-free, discussed with cardiology, Dr. Saralyn Pilar. The patient was given aspirin. She is already on nitroglycerin, beta blockers and losartan and nitrite. As long as she is chest pain-free, we will hold on IV heparin drip. We will continue cycling her cardiac enzymes and follow the trend.  Cardiology will see her today.  2. End-stage renal disease. We will consult nephrology to resume her hemodialysis on Tuesday, Thursday, Saturday.  3. History of coronary artery disease. The patient currently is chest pain-free on aspirin, beta blockers, Imdur and losartan.  4. Hypertension. Blood pressure acceptable. Continue with home meds.  5. Gastroesophageal reflux disease. Continue with PPI.  6. CODE STATUS: Full code.  7. Deep vein thrombosis prophylaxis. Subcutaneous heparin.   TOTAL TIME SPENT ON ADMISSION AND PATIENT CARE: 55 minutes.       ____________________________ Albertine Lavergne, MD dse:sg D: 11/26/2013 08:10:08 ET T: 11/26/2013 08:59:31 ET JOB#: [id number]  cc: Albertine Aspynn, MD, <Dictator> Lasalle Abee Graciela Husbands  MD ELECTRONICALLY SIGNED 11/26/2013 23:36

## 2014-10-06 NOTE — Discharge Summary (Signed)
PATIENT NAME:  [name], [name] MR#:  [id number] DATE OF BIRTH:  06-Feb-1961  DATE OF ADMISSION:  02/09/2014  DATE OF DISCHARGE:  02/10/2014   DISCHARGE DIAGNOSES:  1.  Non-ST segment elevation myocardial infarction.  2.  Hypertension.  3.  End-stage renal disease.  4.  Anemia of chronic disease.  5.  Anxiety.   DISCHARGE MEDICATIONS:  1.  Prednisone 20 mg 1 daily on Tuesday, Thursday, and Saturday.  2.  Gabapentin 300 mg once a day on Tuesday, Thursday, Saturday.  3.  Isosorbide mononitrate 60 mg once a day.  4.  Aspirin 81 mg daily.  5.  Sensipar 30 mg oral once a day.  6.  Sevelamer 800 mg 3 tablets oral 3 times a day.  7.  Hydroxyzine 1 tablet oral 2 times a day. 9.  Coreg 12.5 mg oral 2 times a day.  10. Plavix 75 mg daily. 11. Nitroglycerin 0.4 sublingual as needed for chest pain.  12. Atorvastatin 40 mg daily.   NEW MEDICATIONS: added are the Plavix, Coreg and atorvastatin along with nitroglycerin pills.   CONSULTANTS: Serafina Royals, MD with cardiology Sherilyn Banker, MD with nephrology.   IMAGING STUDIES: Include a chest portable showed stable cardiomegaly with pulmonary vascular congestion.   ADMITTING HISTORY AND PHYSICAL: Please see detailed H and P dictated by Dr. Waldron Labs.  In brief, a 54 year old African American female patient with history of coronary artery disease, chronic angina went to the hospital complaining of chest pain. She was found to have elevated troponin and admitted to hospitalist service.    HOSPITAL COURSE:  NSTEMI:  The patient had significant elevation of troponin of 2.3; we will start her on heparin drip was seen by cardiology who stated the patient would be monitored at that point.  The patient has had recent cardiac catheterization with significant disease, not amenable to any stenting.  She likely also has small vessel ischemia causing chronic angina. The patient was started on Plavix and statin which her new along with metoprolol being switched  to Coreg.  By the day of discharge, the patient complains of some left-sided chest pain, but this is different from prior chest pain she has had, this is pleuritic.  She is not tachycardic, not hypoxic. Case discussed with Dr. Nehemiah Massed who advised to discharge home to follow up with her regular cardiologist, Dr. Clayborn Bigness.  No cardiac catheterization or stress test is planned at this point.  The patient could not be started on Ranexa as she is on dialysis.   The patient was started on Plavix, statin for further cardiac prevention.   For end-stage renal disease. The patient did have dialysis. She had some vascular congestion, shortness of breath during admission, which has resolved. This could have been the cause of elevated troponin in setting of Kidney disease.   Prior to discharge, the patient's lungs sound clear. Heart sounds are S1, S2, without any murmurs. No edema. Chest pain free.   Time Spent on discharge and discharge activity was 40 minutes.      ____________________________ Leia Alf Meghan Tiemann, MD srs:DT D: 02/12/2014 12:45:36 ET T: 02/12/2014 16:50:50 ET JOB#: [id number]  cc: Alveta Heimlich R. Khloee Garza, MD, <Dictator> Neita Carp MD ELECTRONICALLY SIGNED 02/27/2014 16:31

## 2014-10-06 NOTE — H&P (Signed)
PATIENT NAME:  [name], DILUZIO MR#:  [id number] DATE OF BIRTH:  1960/08/20  DATE OF ADMISSION:  02/05/2014  PRIMARY CARE PHYSICIAN:  Nicky Pugh, MD   NEPHROLOGIST:  Sherilyn Banker, MD  .   CHIEF COMPLAINT: Chest pain.   HISTORY OF PRESENT ILLNESS: The patient is a 54 year old female with a history of ESRD on hemodialysis comes in with chest pain. The patient woke up from sleep with severe chest pain, which is left-sided and she had noticed that chest was 7/10 severity, radiating to the left arm and also the left jaw.  The patient took nitroglycerin with some relief and she comes to the Emergency Room.  When the chest pain happened she had trouble breathing, diaphoresis and also vomiting.  She vomited multiple x 3.  The patient's troponins are elevated in the Emergency Room at 0.11. The patient also noted to have hypokalemia with potassium of 7.  The patient last dialysis was Saturday and according to her she finished the full dialysis session on Saturday.  The patient denies any dietary indiscretion.  The patient denies any trouble breathing at this time.    PAST MEDICAL HISTORY:  1.  Significant for history of admission in July for stable angina and the patient was seen by Dr.fath and the patient did not have any s workup because the patient had a cardiac catheterization in April of this year, which showed chronmically occluded RCA;  and medical management was advised at that time.  The patient also has a history of ESRD on hemodialysis.  2.  History of coronary artery disease and the patient had history of RCA stent and cardiac catheterization in April showed in stent stenosis that was diffuse.   Th 3.  She has a history of hepatitis C and a history of cerebrovascular accident.   PAST SURGICAL HISTORY: Neck surgery, history of AV graft and patient also has a history of AV fistula, and cardiac catheterization before.   SOCIAL HISTORY: Smokes one cigarette a day. The patient lives at home  with her daughter. No alcohol. No drugs.   FAMILY HISTORY: Significant for prostate cancer, hypertension.    ALLERGIES:  Morphine,  latex.   MEDICATIONS:  1.  Takes prednisone 20 mg during dialysis because of  rash with the dialysate and she takes prednisone for that. 2.  Aspirin 81 mg daily. 3.  Neurontin 300 mg p.o. daily.  4.  Hydralazine 25 mg p.o. t.i.d.  5.  Imdur 60 mg p.o. daily.  6.  Losartan 50 mg daily.  7.  Metoprolol succinate 50 mg p.o. daily.  8.  Pantoprazole 40 day.  9.  Prednisone 20 mg at dialysis.  10. Sensipar 30 mg once a day.  11. Simvastatin 20 mg at bedtime.    REVIEW OF SYSTEMS:   CONSTITUTIONAL:  The patient denies any fever or chills.  EYES: No blurred vision. No inflammation.  ENT: No tinnitus. No ear pain. No epistaxis. No difficulty swallowing.  RESPIRATIONS: The patient denies any cough or hemoptysis.  CARDIOVASCULAR: Chest pain which woke her up from sleep and did not resolve with nitroglycerin.  GASTROINTESTINAL:  The patient did have some vomiting this morning, but denies any nausea or diarrhea.  GENITOURINARY: The patient has no dysuria. She is on dialysis.  ENDOCRINE: No polyuria or nocturia. HEMATOLOGICAL:  The patient has chronic anemia.  SKIN: No skin rashes.  MUSCULOSKELETAL: The patient has no joint pain.  NEUROLOGIC: No tremors,  or ataxia.  PSYCHIATRIC: No anxiety or  depression.   PHYSICAL EXAMINATION:  VITAL SIGNS: Temperature 98.4, heart rate 100, blood pressure is very elevated at 190/107. During my visit, the blood pressure was 157/117, heart rate 101, saturations 99% on room air.  GENERAL: The patient is alert, awake, and oriented x 3 not in distress.  HEAD: Normocephalic, atraumatic.  EYES: Pupils equal, reacting to light. No conjunctival pallor. Extraocular movements intact. Marland Kitchen  EARS: No drainage Nose;No turbinate hypertrophy Throat;NO orphayngeal erythema,.  MOUTH: No lesions. No drainage.  NECK: Supple. No JVD. No carotid  bruit, range of motion intact.  CARDIOVASCULAR: S1, S2 regular. No murmurs. RESPIRATORY:  Clear to auscultation. No wheeze. No rales. The patient is not using accessory muscles respiration.  GASTROINTESTINAL: Abdomen is soft, nontender, nondistended. Bowel sounds present.  MUSCULOSKELETAL: Normal gait and station. The patient's range of motion is adequate.  SKIN: Inspection is normal, well-hydrated. No diaphoresis.  LYMPHATICS: No lymphadenopathy.  NEUROLOGIC: Alert, awake, oriented.  Cranial nerves II through XII intact. Power 5/5 in upper and lower extremities. Sensation is intact. Deep tendon reflexes 2+ bilaterally.  PSYCHIATRIC: Mood and affect are within normal limits.   LABORATORY DATA: Troponins are 0.11. INR is 1.  Electrolytes: Sodium is 135, potassium 7, chloride 103, bicarbonate 20, BUN 70, creatinine 8.8, serum glucose 71.  WBC 6.8, hemoglobin 9.4, hematocrit 30, platelets 185, BNP 88,303. Chest x-ray shows stable cardiac enlargement and moderate vascular congestion. EKG: Normal sinus at 90 bpm ASSESSMENT AND PLAN:  1.  The patient is a 54 year old female with chest pain and elevated troponins. The symptoms are concerning for unstable angina. The patient recently had a cardiac catheterization in April, but medical management was advised, but because of her chest pain and elevated troponins, we are going to consult cardiology again, admit her to telemetry, coagulate her troponins 2 more times and continue her on aspirin, nitroglycerine and also beta blockers    2.  Hypokalemia. Continue potassium shifting measures, which are already done. The patient received calcium gluconate, Kayexalate, and insulin with dextrose in the emergency room and the patient is going to have emergency dialysis for hyperkalemia and congestive heart failure.  3.   End-stage renal disease, on hemodialysis now with hyperkalemia and congestive heart failure.   on dialysis and Dr. Candiss Norse has been contacted.  4.   Malignant hypertension.  The patient did not take morning medications.  She is already on hydralazine and beta blockers and we will use labetalol 20 mg IV every 6 hours p.r.n. for malignant hypertension more than 160/90.    TIME SPENT ON THIS ADMISSION:  Over 60 minutes and will consult cardiology as well for her chest pain and elevated troponins.     ____________________________ Epifanio Lesches, MD sk:DT D: 02/05/2014 12:58:00 ET T: 02/05/2014 14:44:08 ET JOB#: [id number]  cc: Epifanio Lesches, MD, <Dictator> Epifanio Lesches MD ELECTRONICALLY SIGNED 02/26/2014 9:40

## 2014-10-06 NOTE — Consult Note (Signed)
Present Illness The patient is a 54 year old female with known history of coronary artery disease, status post PCI, as well as end-stage renal disease, on chronic hemodialysis. The patient underwent  cardiac catheterization on 10/09/2013, which revealed insignificant disease in the left anterior descending and left circumflex coronary artery. There is diffuse 90% and 75% in-stent restenosis in the right coronary artery, which looks similar to a prior cardiac catheterization from 05/25/2012, which was felt to be best treated medically.  She presented to [facility] Emergency Room with a chief complaint of chest pain. Troponins were mildly elevated to 0.15. She was hyperkalemic with potassium greater than 7. EKG was unremarkable for ischemia. She underwent hemodialysis yesterday with improvement in her symptoms and potassium She denies chest pain. She apperas to have ruled out for an mi. Elevated troponin is secondary to her renal insuffiency and hyperkalemia.   Physical Exam:  GEN no acute distress   HEENT PERRL   NECK supple  No masses   RESP normal resp effort  clear BS   CARD Regular rate and rhythm  Normal, S1, S2   ABD denies tenderness  normal BS   LYMPH negative neck, negative axillae   EXTR negative cyanosis/clubbing, negative edema   SKIN normal to palpation   NEURO cranial nerves intact, motor/sensory function intact   PSYCH A+O to time, place, person   Review of Systems:  Subjective/Chief Complaint chest pain last pm but none this am   General: Fatigue  Weakness   Skin: No Complaints   ENT: No Complaints   Eyes: No Complaints   Neck: No Complaints   Respiratory: No Complaints   Cardiovascular: No Complaints   Gastrointestinal: No Complaints   Genitourinary: No Complaints   Vascular: No Complaints   Musculoskeletal: No Complaints   Neurologic: No Complaints   Hematologic: No Complaints   Endocrine: No Complaints   Psychiatric: No Complaints   Review of  Systems: All other systems were reviewed and found to be negative   Medications/Allergies Reviewed Medications/Allergies reviewed   Family & Social History:  Family and Social History:  Family History Non-Contributory   EKG:  EKG NSR   Interpretation nonspecific st t wave changes    Morphine: Rash  Adhesive: Blisters  Latex: Blisters, Hives   Impression 54 year old female with history of end-stage renal disease with diffusely disease coronaries who presented with chest pain in hyperkalemia.  She underwent hemodialysis with improvement in her   Symptoms.  She currently is pain-free and feels under baseline.  She will undergo hemodialysis today.  Her elevated serum troponin was mildly elevated and appears to be secondary to her renal insufficiency and elevated potassium.  Would continue day ambulate and proceed with hemodialysis today.  He has stable weight consider discharge with no further cardiac workup.  Patient appears to be at her baseline.  Will continue with her current medications including aspirin 81 mg daily.  Hydralazine at 50 mg q.i.d., the isosorbide mononitrate at 60 mg daily.  Patient is not on a beta-blocker due to relative bradycardia in the past.   Plan 1. Continue asa,   Hydralazine and imdur at current doses.  2. Amubalte and consider discharge if stable. 3. Follow up as outpatient  with Dr. Clayborn Bigness.  4. No further cardiac workup in hospital needed at present.   Electronic Signatures: Teodoro Spray (MD)  (Signed 25-Aug-15 09:07)  Authored: General Aspect/Present Illness, History and Physical Exam, Review of System, Family & Social History, EKG , Allergies, Impression/Plan  Last Updated: 25-Aug-15 09:07 by Teodoro Spray (MD)

## 2014-10-06 NOTE — Consult Note (Signed)
   Present Illness 54 yo female with history of cardiomyopathy with ef of 25% with cath in 2008 revealing diffuse three vessel disease trated medically, history of end stage renal failure on hemodialysis who noted severe chest pain prompting presentation to the er. She also had nausea and vomiting with this. EKG revealed nsr with lvh but no ishcemic changes. Her initial serum troponin was 0.08. Second troponin was 8.5. She is currently hemodynamically stable and without chest pain. She has dialysis on T, Th, Sat. She denies shortness of breath. EKG currently is nsr with lvh. No ischmiea. She was minimally responsive on presentation with head ct unremarkable for acute process. She is fully alert and oriented currently   Physical Exam:  GEN no acute distress   HEENT PERRL, hearing intact to voice   NECK supple  No masses   RESP normal resp effort  clear BS  rhonchi   CARD Regular rate and rhythm  Normal, S1, S2  Murmur   Murmur Systolic   Systolic Murmur axilla   ABD denies tenderness  no hernia  normal BS   LYMPH negative axillae   EXTR negative edema   SKIN normal to palpation   NEURO cranial nerves intact, motor/sensory function intact   PSYCH alert, A+O to time, place, person   Review of Systems:  Subjective/Chief Complaint admitted with nausea and vomitiing with chest pain   General: No Complaints   Skin: No Complaints   ENT: No Complaints   Eyes: No Complaints   Neck: No Complaints   Respiratory: Short of breath   Cardiovascular: Chest pain or discomfort   Gastrointestinal: Nausea   Genitourinary: No Complaints   Vascular: No Complaints   Musculoskeletal: No Complaints   Neurologic: No Complaints   Hematologic: No Complaints   Endocrine: No Complaints   Psychiatric: No Complaints   Review of Systems: All other systems were reviewed and found to be negative   Medications/Allergies Reviewed Medications/Allergies reviewed   Family & Social History:   Family and Social History:  Family History Non-Contributory   Place of Living Home   EKG:  EKG NSR   Abnormal NSSTTW changes    Morphine: Rash  Adhesive: Blisters  Latex: Blisters, Hives   Impression 54 yo female with esrd on hd, history of cardiomyopathy with ef of 20-25% and diffuse three vessel disease felt to best managed by medication after cath in 2013 revealed diffuse disease. Now admitted with progressive nausea and vomiting with chest pain and transient alterred mental status. She has ruled in for a nstemi. Will need to review cath dosne in 2013 and consider relook cath tomorrow if anatomy appears likie intervention could be acheived.   Plan 1. Conitnue with current meds. 2. Coniotnue to rule out for mi 3. Discuss cardiac cath with nephrology with hregard to timing of dialysis. 4. Consider cardiac cath in am.   Electronic Signatures: Teodoro Spray (MD)  (Signed 26-Apr-15 09:54)  Authored: General Aspect/Present Illness, History and Physical Exam, Review of System, Family & Social History, EKG , Allergies, Impression/Plan   Last Updated: 26-Apr-15 09:54 by Teodoro Spray (MD)

## 2014-10-06 NOTE — H&P (Signed)
PATIENT NAME:  [name], [name] MR#:  [id number] DATE OF BIRTH:  1960/11/05  DATE OF ADMISSION:  10/08/2013  PRIMARY CARE PHYSICIAN: Dr. Nicky Pugh.  REFERRING EMERGENCY ROOM PHYSICIAN: Dr. Beather Arbour.   CHIEF COMPLAINT: Chest pain and altered mental status.   HISTORY OF PRESENT ILLNESS: The patient is a 54 year old African American female with past medical history of end-stage renal disease on hemodialysis, coronary artery disease status post stent, hypertension, history of stroke, hepatitis C, multidrug resistant organism on hemodialysis who was in her usual state of health until yesterday. The patient went and got her dialysis on tuesday and was doing fine after coming home. In the evening at around 8:00, the patient was complaining of chest pain. She vomited x 2, and eventually family members called EMS. The patient was talking to the EMS, initially, but fell asleep on her way to ER. She woke up after coming to the ER, but subsequently, she became quite lethargic. She did not receive any sedating medications. As reported by the ER physician, the patient was just responding to pain stimuli during her examination. Blood cultures were obtained and the patient is started on Zosyn and vancomycin. The patient being dialysis patient she does not make any urine. CAT scan of the head was done which has revealed no acute intracranial pathology. Her initial cardiac enzymes revealed troponin at 0.08, but after reviewing old results the patient has chronic leakage of her troponin, which usually runs at around 0.06 to 0.09. During my examination, the patient is responding to touch stimuli, waking up, but again falling asleep. I was unable to get any history from her. History is taken from mom and sister at bedside. The patient sees Dr. Clayborn Bigness as an outpatient. No history of diarrhea as reported by the family members.   PAST MEDICAL HISTORY: Nonischemic cardiomyopathy. Coronary artery disease status post stent,  hypertension, hyperlipidemia, GERD hepatitis C, CVA, history of multidrug resistant organism infection, end-stage renal disease on hemodialysis on Tuesday, Thursday and Saturday.   PAST SURGICAL HISTORY: Neck surgery, cardiac stent placements, AV fistula in the left upper extremity, C-section.   ALLERGIES: MORPHINE, ADHESIVE TAPE, LATEX.   PSYCHOSOCIAL HISTORY: Lives at home on disability. According to the old records smokes 1 pack which last for two weeks. No history of alcohol or illicit drug usage.   FAMILY HISTORY: Father with history of hypertension, prostate cancer. Mother had hypertension and gallstones.   REVIEW OF SYSTEMS: Unobtainable as the patient is very lethargic.   HOME MEDICATIONS:Sensipar 30 mg p.o. once daily, prednisone 20 mg once daily,  pantoprazole 40 mg once a day, metoprolol succinate 50 mg once a day, losartan 50 mg once daily, isosorbide mononitrate 60 mg 1 tablet p.o. once a day, hydralazine 25 mg 3 times a day, gabapentin 300 mg p.o. once daily, aspirin 81 mg once a day.   PHYSICAL EXAMINATION: VITAL SIGNS: Temperature 98.7, pulse 90, respiration 18, blood pressure 143/90, pulse oximetry 98% on 2 liters.  GENERAL APPEARANCE: Not in acute distress. Moderately built and nourished. The patient is really lethargic,  responding to touch and pain stimuli . HEENT: Normocephalic sluggishly reacting pupils. No conjunctival injection. No sinus tenderness. Moist mucous membranes.  NECK: Supple. No JVD. No thyromegaly.  LUNGS: Moderate air entry. Decreased breath sounds at the bases . CARDIAC: S1, S2 normal. Regular rate and rhythm, tachycardic. Marland Kitchen GASTROINTESTINAL: Soft. Bowel sounds are positive in all four quadrants. Nontender, nondistended. No masses felt.  NEUROLOGIC: Arousable to deep touch and stimuli, but  still lethargic and falling asleep, not following any verbal commands. Reflexes are 2+, could not elicit motor and sensory.  EXTREMITIES: Trace edema is present. No  cyanosis. No clubbing.  SKIN: Warm to touch. Normal turgor. No rashes. No lesions.  MUSCULOSKELETAL: No joint effusion, tenderness, erythema.  PSYCHIATRIC: Mood and affect could not be elicited as the patient is with lethargy.   LABORATORY AND IMAGING STUDIES: LFTs: Total protein 9.3, alkaline phosphatase 155, AST elevated at 64, troponin 0.08. WBC 4.3, hemoglobin 11.7, hematocrit 36.4, platelets are 162, MCV 100. Salicylate and acetaminophen levels are normal. PH of venous gas has revealed 7.42 pH, pCO2 50 and lactic acid, 0.9, glucose 76, BUN 33, creatinine 5.5, serum sodium and potassium are normal. Chloride and CO2 are normal. GFR 10, serum osmolality 285, calcium 8.0, serum ammonia 25.   CT of the head without contrast has revealed no acute intracranial pathology.   Chronic bilateral frontal and parietal infarcts are grossly unchanged from prior studies.   Suggestion of mild cortical volume loss.   Chest x-ray PA and lateral mild biatrial atelectasis noted. Vascular congestion and cardiomegaly seen.   A 12-lead EKG: Sinus tach at 118 beats per minute, left atrial enlargement, left ventricular hypertrophy. No acute ST-T wave changes.   ASSESSMENT AND PLAN: A 54 year old African American female presenting to the ER with a chief complaint of chest pain initially and subsequently with altered mental status, will be admitted with the following assessment and plan:  1. Altered mental status, probably from metabolic encephalopathy from possible underlying sepsis. Blood cultures were obtained. Urine cultures cannot be done as the patient does not make any urine.  2. The patient will be on broad-spectrum antibiotics with Zosyn, Levaquin and vancomycin in view of multidrug resistance in the past.   PLAN:  1. Narrow down the  antibiotics once blood cultures results are obtained.  2. Neuro checks.  3. Chest pain with chronically elevated troponin. Cycle cardiac biomarkers. The patient is on  telemetry. Consult Dr. Clayborn Bigness.  4. Will continue aspirin and rectal suppository. Oxygen will be provided.  5. Cardiology consult.  6. History of coronary artery disease with nonischemic cardiomyopathy. The patient will be on telemetry. Cycle cardiac biomarkers. The patient is currently n.p.o. in view of altered mental status. Consult cardiology, Dr. Clayborn Bigness.  7. Hypertension. Blood pressure is elevated. We will provide her hydralazine IV p.r.n. basis.  8. GERD. We will provide GI prophylaxis.   Plan of care was discussed in detail with the patient's mother and sister at bedside. They both verbalized understanding of the plan.   TOTAL CRITICAL CARE TIME SPENT: 50 minutes.   ____________________________ Nicholes Mango, MD ag:sg D: 10/08/2013 04:30:18 ET T: 10/08/2013 07:37:42 ET JOB#: [id number]  cc: Nicholes Mango, MD, <Dictator> Mikeal Hawthorne. Brynda Greathouse, MD  Nicholes Mango MD ELECTRONICALLY SIGNED 10/20/2013 7:40

## 2014-10-06 NOTE — Discharge Summary (Signed)
PATIENT NAME:  [name], [name] MR#:  [id number] DATE OF BIRTH:  1961/04/09  DATE OF ADMISSION:  02/05/2014 DATE OF DISCHARGE:  02/07/2014  PRESENTING COMPLAINT:  Chest pain.    DISCHARGE DIAGNOSES:  1.  Unstable angina, medical management recommended.  2.  Coronary artery disease, chronic with on and off chest pain, medical management.  3.  End-stage renal disease, on hemodialysis.   MEDICATIONS AT DISCHARGE: 1.  Prednisone 20 mg on Tuesday, Thursday, and Saturday before dialysis. 2.  Gabapentin 300 mg on Tuesday, Thursday, Saturday.  3.  Isosorbide dinitrate 60 mg extended release 1 tablet daily.  4.  Aspirin 81 mg at bedtime.  5.  Hydralazine 50 mg 3 times a day.  6.  Losartan 50 mg daily at bedtime.  7.  Metoprolol 50 mg daily.  8.  Sensipar 30 mg daily.  9.  Sevelamer 800 mg 3 tablets 3 times a day before meals.  10. Hydroxyzine hydrochloride 50 mg b.i.d.  11. Protonix 40 mg daily.  12. Ranitidine 150 mg at bedtime.  FOLLOWUP:  With Dr. Clayborn Bigness in 1 to 2 weeks, resume hemodialysis as before.     CONSULTS:  Cardiology consultation by Dr. Ubaldo Glassing.   BRIEF SUMMARY OF HOSPITAL COURSE:  [name] is well-known to our service from many previous admissions, who has a history of end-stage renal disease on hemodialysis, coronary artery disease status post stent in the past, who comes in with:  1.  Unstable angina with borderline elevated troponins. The patient was admitted with chest pain and continued on aspirin, nitroglycerin, beta-blockers, losartan, and long-acting Imdur.  She was chest pain free now.  She was overnight kept in the CCU.  There was no aortic dissection or PE noted on CT chest.  She had a heart catheterization in April 2015.  Medical management was recommended. She was prescribed a PPI for possible GERD-related symptoms.  2.  End-stage renal disease. Hemodialysis was continued in-house on Tuesday, Thursday, and Saturday.  3.  History of coronary artery disease,  status post stent in the past.  The last heart catheterization was done in April 2015; she had a chronically occluded  RCA and medical management was recommended.  4.  Hypertension.  Blood pressure was acceptable, continued home medications.  5.  Gastroesophageal reflux disease. Continue PPI.   Hospital stay otherwise remained stable. The patient remained a FULL CODE.    TIME SPENT: 40 minutes    ____________________________ Gus Height A. Posey Pronto, MD sap:nr D: 02/09/2014 16:16:26 ET T: 02/09/2014 21:44:43 ET JOB#: [id number]  cc: Thinh Cuccaro A. Posey Pronto, MD, <Dictator> Ilda Basset MD ELECTRONICALLY SIGNED 02/20/2014 14:51

## 2014-10-06 NOTE — Discharge Summary (Signed)
PATIENT NAME:  [name], FLOCKHART MR#:  [id number] DATE OF BIRTH:  05/27/1961  DATE OF ADMISSION:  04/10/2014 DATE OF DISCHARGE: 04/11/2014     Transferred to [facility].  ADMITTING DIAGNOSES:  1. Hypotension.  2. Chest pain.  3. Shortness of breath.   DISCHARGE DIAGNOSES:  1. Hypotension, possibly related to large pericardial effusion.  2.  Pericardial effusion without evidence of tamponade, but is in posterior aspect; may need further surgical intervention.  2. Coronary artery disease, status post recent coronary artery bypass graft.  3. Hypoglycemia due to poor p.o. intake. The patient on D5-containing fluids.  4. End-stage renal disease.  5. History of hepatitis.  6. History of cerebrovascular accident in the past.  7. Status post arteriovenous fistula placement.   CONSULTANTS: Munsoor Lilian Kapur, MD, nephrology   PERTINENT LABORATORY AND EVALUATIONS: Admitting glucose 93, BUN 63, creatinine 9.06, sodium 135, potassium 4.6, chloride 99, CO2 is 21, calcium 7.8. LFTs showed a total protein of 8.3, albumin 2.9, bilirubin total 0.6, alkaline phosphatase 100, AST 43, ALT 21. Troponin 0.08, 0.05 and 0.06. WBC 8.1, hemoglobin 11.1, platelet count was 151,000. INR 1.6.   Echocardiogram of the heart shows ejection fraction of less than 20, severely decreased global left ventricular systolic function, moderately dilated right atrium, severely dilated left atrium, large pericardial effusion in the posterior aspect of the left ventricle.   CT scan of the chest per PE protocol shows no evidence of PE but large pericardial effusion.   HOSPITAL COURSE: Please refer to H and P done by the admitting physician. The patient is a 54 year old, African American female who had undergone a CABG in September at [facility] after a non-ST MI. The patient has required multiple admissions since then. She presented to our hospital with hypotension, chest pain. The cause of the patient's hypotension was unclear;  however, chest x-ray did show that the cardiac silhouette was enlarged. Therefore, she underwent a CT per PE protocol as well as echo, which confirmed a large posterior pericardial effusion. I discussed her case with Dr. Clayborn Bigness, who feels that he would not be able to insert a needle to do pericardiocentesis. He recommended transfer to [facility]. I spoke to Dr. Cari Caraway accepted the patient. The patient will be transferred to [facility].   MEDICATIONS: At the time of transfer, she is on Levophed drip, Tylenol 650 q.4 h p.r.n., amiodarone 200 mg daily, aspirin 81 mg 1 tab p.o. daily, atorvastatin 40 mg daily, Sensipar 30 mg q.12 h, Atarax 50 mg q.12 h, Protonix 40 mg b.i.d., ranitidine 150 mg at bedtime, senna 1 tablet p.o. b.i.d. p.r.n., Renvela 2400 mg t.i.d. with meals, Coumadin 0.5 mL q.6 p.m., Lidoderm patch apply to affected area, Midodrine daily, Zosyn 3.375 mg IV q.12 h.   TIME SPENT: 35 minutes on the discharge    ____________________________ Chana Bode H. Posey Pronto, MD shp:je D: 04/11/2014 14:23:57 ET T: 04/11/2014 15:02:46 ET JOB#: [id number]  cc: Sibyl Mikula H. Posey Pronto, MD, <Dictator> Alric Seton MD ELECTRONICALLY SIGNED 04/21/2014 8:38

## 2014-10-06 NOTE — Consult Note (Signed)
Present Illness The patient is a 54 year old female with known history of coronary artery disease, status post prior coronary stents, as well as end-stage renal disease, on chronic hemodialysis. The patient underwent  cardiac catheterization on 10/09/2013, which revealed insignificant disease in the left anterior descending and left circumflex coronary artery. There is diffuse 90% and 75% in-stent restenosis in the right coronary artery, which looks similar to a prior cardiac catheterization from 05/25/2012, which was felt to be best treated medically.  She presented to [facility] Emergency Room with a chief complaint of chest pain. Initial troponin was 0.39. Follow-up troponin was 0.42. SHe is on HD for ESRD. She denies any chest pain since last night. She is anxious to go home.   Physical Exam:  GEN no acute distress   HEENT PERRL   NECK supple  No masses    RESP normal resp effort  clear BS    CARD Regular rate and rhythm  Bradycardic    ABD denies tenderness  normal BS    LYMPH negative neck, negative axillae   EXTR negative cyanosis/clubbing, negative edema   SKIN normal to palpation   NEURO cranial nerves intact, motor/sensory function intact   PSYCH A+O to time, place, person   Review of Systems:  Subjective/Chief Complaint chest pain last pm but none at present   General: Fatigue  Weakness    Skin: No Complaints    ENT: No Complaints    Eyes: No Complaints    Neck: No Complaints    Respiratory: No Complaints    Cardiovascular: No Complaints    Gastrointestinal: No Complaints    Genitourinary: No Complaints    Vascular: No Complaints    Musculoskeletal: No Complaints    Neurologic: No Complaints    Hematologic: No Complaints    Endocrine: No Complaints    Psychiatric: No Complaints    Review of Systems: All other systems were reviewed and found to be negative    Medications/Allergies Reviewed Medications/Allergies reviewed    Family & Social History:   Family and Social History:  Family History Non-Contributory    EKG:  EKG NSR    Interpretation nonspecific st t wave changes   Radiology Results:  XRay:    17-Jul-15 03:11, Chest Portable Single View  Chest Portable Single View   REASON FOR EXAM:    chest pain  COMMENTS:       PROCEDURE: DXR - DXR PORTABLE CHEST SINGLE VIEW  - Dec 29 2013  3:11AM     CLINICAL DATA:  Chest pain    EXAM:  PORTABLE CHEST - 1 VIEW    COMPARISON:  11/25/2013    FINDINGS:  There is chronic marked cardiopericardial enlargement. Coronary  atherosclerosis or stent noted overlapping the left upper heart.  Upper mediastinal contours are negative. There is diffuse  interstitial coarsening from pulmonary venous congestion. No  asymmetric opacity or pleural effusion. No pneumothorax. Vascular  stent noted in the left arm.     IMPRESSION:  Cardiomegaly and pulmonary venous congestion.      Electronically Signed    By: Jorje Guild M.D.    On: 12/29/2013 03:42         Verified By: Gilford Silvius, M.D.,    Morphine: Rash  Adhesive: Blisters  Latex: Blisters, Hives   Impression 54 yo female with history of esrd on hd who has diffusly diseased rca not amenable to pcvi or surgical intervention with insignficant disease in her left system by cath in 4/15.  Doing well at present. Mild troponin elevation likely due to demand ischemia given renal function and cad. WOuld continue curent meds, ambulate and consider disharge to home with out patient follow up per Dr. Clayborn Bigness. Continue asa, simvastatin, nitrates and beta blockers Pt does not appear to have had an acute coronary event. CZXR showed mild volume overload which is stable.   Plan 1. Continue asa, simvastatin, beta blockers and imdur at current doses.  2. AMubalte and consider discharge if stable. 3. Follwo up as outpatient.  4. No further cardiac workup in hospital needed at present.   Electronic Signatures: Teodoro Spray (MD)   (Signed 17-Jul-15 15:09)  Authored: General Aspect/Present Illness, History and Physical Exam, Review of System, Family & Social History, Home Medications, EKG , Radiology, Allergies, Impression/Plan   Last Updated: 17-Jul-15 15:09 by Teodoro Spray (MD)

## 2014-10-06 NOTE — Discharge Summary (Signed)
PATIENT NAME:  [name], URQUIDI MR#:  [id number] DATE OF BIRTH:  [birth year]  DATE OF ADMISSION:  10/08/2013 DATE OF DISCHARGE:  10/11/2013  ADMITTING DIAGNOSES: Chest pain, altered mental status.   DISCHARGE DIAGNOSES: 1.  Acute encephalopathy, possibly related to acute myocardial infarction. The patient's mental status is back to baseline. MRI negative for any new strokes.  2.  Chest pain due to non-ST myocardial infarction status post cardiac catheterization which reveals unchanged coronary anatomy with chronic severe occlusion of the right coronary artery. Medical management recommended by cardiology.  3.  End-stage renal disease.  4.  Hypertension.  5.  Hyperlipidemia.  6.  Gastroesophageal reflux disease.  7.  Hepatitis C.  8.  History of cerebrovascular accident.  9.  History of multidrug resistant infection.  10.  Status post neck surgery.  11.  Status post cardiac stent placement.  12.  AV fistula in the left upper extremity status post C-section.   CONSULTANTS: Dr. Anthonette Legato and Dr. Bartholome Bill.  PERTINENT DIAGNOSTIC DATA: Cardiac catheterization shows distal left main 25% stenosis, proximal LAD 25% stenosis, proximal circumflex 50% stenosis, mid RCA tubular lesion 90% stenosis at site of prior stent, lesion 2 diffuse, 75% stenosis at site of prior stent in the RCA.  Admitting glucose 76, BUN 33, creatinine 5.50, sodium 140, potassium 3.9, chloride 100, CO2 31, calcium 8. LFTs: Total protein 9.3, albumin 3.5, bili total 0.8, alk phos 155, AST 64, ALT 51. Troponin 0.08, 8.50 and 12. WBCs 4.3, hemoglobin 11.7, platelet count 162,000. Blood cultures x2 no growth.   MRI of the brain showed no acute infarct, remote frontal lobe and parietal lobe infarcts, tiny right cerebellar infarct.   HOSPITAL COURSE: Please refer to H and P done by the admitting physician. The patient is a 54 year old African American female with history of coronary artery disease, hypertension,  hyperlipidemia, GERD, and history of CVA presented with altered mental status and chest pain, presented with acute encephalopathy, who presented to the ED being drowsy. She was noted to have an elevated troponin and felt to have a non-ST MI. She was admitted to the hospital and further work-up was pursued. She was initially treated with heparin, subsequently seen by cardiology, and underwent cardiac cath. The results of the cath are as above. She has obstructive disease, which will be treated medically. The patient will be treated with medical management per cardiology. In terms of her encephalopathy, felt to be due to MI and there was no evidence of new CVA on the MRI. Her mental status normalized. She is back to her baseline. She also has hepatitis C. An ammonia level was normal. She was seen in consultation by nephrology and cardiology. At this point, she is back to baseline, doing well, stable for discharge.   DISCHARGE MEDICATIONS: Losartan 50 daily, metoprolol succinate 50 daily, aspirin 81 one p.o. daily, Protonix 40 daily, sevelamer 800 three tabs t.i.d., prednisone 20 daily prior to hemodialysis, hydralazine 25 mg 1 tab p.o. t.i.d., gabapentin 300 one cap day before hemodialysis, isosorbide mononitrate 60 daily, Sensipar 30 daily, simvastatin 20 mg at bedtime.   DISCHARGE DIET: Renal.   DISCHARGE ACTIVITY: As tolerated.   DISCHARGE FOLLOWUP: Follow with primary MD in 1 to 2 weeks. Hemodialysis as previously.   TIME SPENT ON DISCHARGE: 35 minutes.   ____________________________ Lafonda Mosses Posey Pronto, MD shp:sb D: 10/12/2013 08:05:53 ET T: 10/12/2013 09:16:37 ET JOB#: [id number]  cc: Tonie Vizcarrondo H. Posey Pronto, MD, <Dictator> Alric Seton MD ELECTRONICALLY SIGNED 10/18/2013 16:14

## 2014-10-06 NOTE — Discharge Summary (Signed)
PATIENT NAME:  JOSEPHENE, PLAUTZ MR#:  [id number] DATE OF BIRTH:  1961-01-14  DATE OF ADMISSION:  11/26/2013 DATE OF DISCHARGE:  11/27/2013  ADMITTING DIAGNOSIS: Chest pain.   DISCHARGE DIAGNOSES: 1.  Chest pain.  2.  Elevated troponin felt to be due to demand ischemia.  3.  Elevated lipase level of unclear etiology at this time.  4.  Abnormal liver function tests.    5.  History of hepatitis C.  6.  History of hypertension.  7.  End-stage renal disease, hemodialysis Tuesdays, Thursdays, Saturdays.  8.  Hyperlipidemia.  9.  Gastroesophageal reflux disease.  10.  Obesity.   DISCHARGE CONDITION: Stable.   DISCHARGE MEDICATIONS:  1.  The patient is to continue losartan 50 mg p.o. daily.  2.  Metoprolol succinate 50 mg p.o. daily.  3.  Aspirin 81 mg p.o. daily.  4.  Pantoprazole 40 mg p.o. daily.  5.  Sevelamer 800 mg 2 tablets 3 times daily.  6.  Prednisone 10 mg p.o. once daily before hemodialysis.  7.  Hydralazine 25 mg 3 times daily.  8.  Gabapentin 300 mg p.o. daily before hemodialysis. 9.  Isosorbide mononitrate 60 mg p.o. daily.  10.  Sensipar 30 mg p.o. daily.  11.  Simvastatin 10 mg p.o. at bedtime.   HOME OXYGEN: None.   DIET: Will be hemodialysis diet with 1500 mL fluid restriction, regular consistency.   ACTIVITY LIMITATIONS: As tolerated.    FOLLOWUP APPOINTMENT: With Dr. Saralyn Pilar in 1 week after discharge, Dr. Brynda Greathouse in 2 days after discharge.   CONSULTANTS: Care management, social work as well as Dr. Saralyn Pilar, Dr. Candiss Norse, also Dr. Anthonette Legato.   RADIOLOGIC STUDIES: Chest x-ray, portable, single view, 13th of June, 2015, revealed cardiomegaly with mild interstitial edema and possibly small left pleural effusion.   HOSPITAL COURSE: The patient is a 54 year old female with past medical history significant for history of end-stage renal disease who presented to the hospital with complaints of chest pains. Please refer to Dr. Graciela Husbands admission note on the  14th of June, 2015. On arrival to the hospital, the patient's vital signs pulse was 96, respiratory rate was 14, blood pressure 199/98, saturation was 93% on room air. Physical exam was unremarkable. The patient's EKG showed sinus tach at 103 beats per minute without ST-T changes as compared to prior EKG. The patient's lab data done on admission to the hospital in the Emergency Room revealed elevation of BUN and creatinine to 28 and 5.26, otherwise BMP was unremarkable. Lipase level was elevated at 773.  The patient's magnesium level was normal at 2.2. The patient's liver enzymes revealed albumin level of 3.3, total protein was 8.4, direct bilirubin was 0.4, alkaline phosphatase 138, AST was 65. Cardiac enzymes revealed mild abnormalities with troponin level of 0.17 on the first set, 0.69 on the second and 1.14 on the third set with normal CK-MB fraction. The patient's CBC, white blood cell count was 5.5, hemoglobin was 10.7, platelet count 131. Coagulation panel: Pro time was elevated to 16.4, INR was 1.3 and activated PTT was 29.9.   The patient was admitted to the hospital for further evaluation. Dr. Saralyn Pilar saw the patient in consultation and followed her along. On the 15th of June, 2015, he felt that the patient's chest pain with borderline elevated troponin is consistent with known coronary artery anatomy and diffusely diseased RCA by recent cardiac catheterization.  Recommended to defer further cardiac diagnostics and discharge her home if ambulates well. The patient was doing  well on the 15th of June, 2015. She felt good, did not have any chest pains or discomfort or shortness of breath. She was recommended to follow up with her primary care physician, Dr. Brynda Greathouse, for further recommendations and continue medications for known coronary artery disease including aspirin, Coreg, metoprolol and losartan.   In regards to history of end-stage renal disease, the patient is continuing to undergo hemodialysis and  next hemodialysis is tomorrow, on the 16th of June, 2015.  She is to continue hemodialysis diet as well as 1500 mL of fluid restriction. She was seen by nephrologist, Dr. Candiss Norse as well as Dr. Holley Raring, who recommended to continue hemodialysis as per prior schedule.   In regards to anemia of chronic disease, Dr. Candiss Norse recommended to restart Epogen with next hemodialysis. For secondary hyperparathyroidism, recommended to check her phosphorus with next hemodialysis and continue Sensipar as well as Renvela.   The patient is being discharged in good condition with the above-mentioned medications and followup. On the day of discharge, 15th of June, 2015, temperature was 97.6, pulse was 98, respiratory rate was 20, blood pressure 134/91. Saturation was 97% on room air at rest.   TIME SPENT:  40 minutes.    ____________________________ Theodoro Grist, MD rv:cs D: 11/27/2013 15:58:40 ET T: 11/27/2013 20:50:27 ET JOB#: [id number]  cc: Theodoro Grist, MD, <Dictator> Isaias Cowman, MD Mikeal Hawthorne. Brynda Greathouse, MD Theodoro Grist MD ELECTRONICALLY SIGNED 12/09/2013 17:19

## 2014-10-06 NOTE — H&P (Signed)
PATIENT NAME:  [name], [name] MR#:  [id number] DATE OF BIRTH:  [birth year]  DATE OF ADMISSION:  12/29/2013  PRIMARY CARE PHYSICIAN: Dr. Brynda Greathouse.  CHIEF COMPLAINT: Chest pain.  HISTORY OF PRESENT ILLNESS: [name] is a 54 year old African American female with history of coronary artery disease, status post stents x 2 in the past. Has history of angina and hypertension, end-stage renal disease on hemodialysis, comes in the Emergency Room after she started experiencing chest pain since yesterday afternoon. The patient states the pain comes and goes. Received some nitroglycerin and aspirin. She is  pain-free during my evaluation in the Emergency Room. Her troponin was borderline elevated at 0.39. Repeat troponin was done, which was 0.41; hence, hospitalist was contacted for further evaluation and management of her unstable angina.   PAST MEDICAL HISTORY:  1. Coronary artery disease status post 2 stents in the past with chronic severe RCA stenosis, medical management recommended by cardiology. Last heart catheter was done sometime around April 2015. Primary cardiologist is Dr. Clayborn Bigness.  2. End-stage renal disease on hemodialysis.  3. Status post fistula for dialysis, left upper arm.  4. History of hepatitis C.  5. History of CVA.  6. Neck surgery in the past.   ALLERGIES: MORPHINE, ADHESIVE TAPE, AND LATEX.   MEDICATIONS:  1. Simvastatin 20 mg daily at bedtime. 2. Sevelamer 800 mg 2 tablets 3 times a day.  3. Sensipar 30 mg daily.  4. Prednisone 20 mg once a day during dialysis.  5. Protonix 40 mg daily.  6. Metoprolol 50 mg p.o. daily.  7. Losartan 50 mg p.o. daily.  8. Isosorbide mononitrate extended-release 60 mg p.o. daily.  9. Hydralazine 25 mg t.i.d.  10. Gabapentin 300 mg daily once a day on dialysis day.  11. Aspirin 81 mg daily.   SOCIAL HISTORY: She smokes 1 cigarette a day. Lives at home on disability. No alcohol or illicit drug use.   FAMILY HISTORY: Significant for  prostate cancer and hypertension.   REVIEW OF SYSTEMS:  CONSTITUTIONAL: The patient denies any fever, chills, fatigue, weakness.  EYES: Denies blurred or double vision, or inflammation.  ENT: Denies tinnitus, ear pain, hearing loss, epistaxis.  RESPIRATORY: Denies cough, wheeze, hemoptysis, dyspnea.  CARDIOVASCULAR: Reports chest pain. Currently, resolved. Denies palpitations.  GASTROINTESTINAL: Denies nausea, vomiting, diarrhea, abdominal pain or hematemesis.  GENITOURINARY: Denies dysuria, hematuria, or renal colic.  ENDOCRINE: Denies any polyuria, heat or cold intolerance.  HEMATOLOGIC: Positive for chronic anemia. No easy bruising or bleeding diathesis.  SKIN: No acne, rash or lesion.  MUSCULOSKELETAL: Denies any gout, swelling or cramps.  NEUROLOGIC: Denies any tremors, vertigo or ataxia. History of CVA in the past.  PSYCHIATRIC: No anxiety, depression, or insomnia.   All other systems reviewed are negative.   PHYSICAL EXAMINATION:  GENERAL: The patient is awake, alert, oriented x 3, not in acute distress.  VITAL SIGNS: Afebrile, pulse is 81. Blood pressure is 120/69, saturations are 89% on room air.  HEENT: Atraumatic, normocephalic. Pupils: PERRLA. EOMI intact. Oral mucosa is moist.  NECK: Supple. No JVD. No carotid bruit.  RESPIRATORY: Clear to auscultation bilaterally. No rales or rhonchi, respiratory distress or labored breathing.  CARDIOVASCULAR: Both heart sounds are normal. Rate and rhythm regular. PMI not lateralized. Chest nontender.  EXTREMITIES: Good pedal pulses, good femoral pulses. No lower extremity edema.  ABDOMEN: Soft, benign, nontender. No organomegaly. Positive bowel sounds.  NEUROLOGIC: Grossly intact cranial nerves II through XII. No motor or sensory deficit.   PSYCHIATRIC: The patient is  awake, alert, oriented x 3.   EKG: No acute ST-T changes.   LABORATORY DATA: Troponin is 0.39, 0.41, 0.38. White count is 5.4, potassium is 4.1. Magnesium is 1.9.    ASSESSMENT: [age]-year-old, [name], with history of end-stage renal disease on hemodialysis, coronary artery disease status post stent in the past, comes in with:  1. Unstable angina with borderline elevated troponins. She will be admitted on the telemetry floor. We will continue aspirin and nitroglycerin p.r.n., beta blockers, losartan, and long-acting Imdur. She is chest pain-free right now. I will hold off on any heparin drip. We will continue to cycle cardiac enzymes and  cardiology to see the patient.  2. End-stage renal disease, consult Nephrology to resume her hemodialysis on Tuesday, Thursday, Saturday.  3. History of coronary artery disease status post stent in the past. Last cardiac catheterization was done sometime in April 2015. She has chronically occluded right RCA. Medical management has been continued.  4. Hypertension. Blood pressure is acceptable. Continue with home medications.  5. Gastroesophageal reflux disease, continue proton pump inhibitor.  6. Deep vein thrombosis prophylaxis with subcutaneous heparin.   CODE STATUS: Full Code.   TIME SPENT: 50 minutes.    ____________________________ Hart Rochester Posey Pronto, MD sap:jr D: 12/29/2013 13:07:21 ET T: 12/29/2013 15:12:10 ET JOB#: [id number]  cc: [name] A. Posey Pronto, MD, <Dictator> Ilda Basset MD ELECTRONICALLY SIGNED 01/09/2014 15:43

## 2014-10-06 NOTE — Consult Note (Signed)
CHIEF COMPLAINT and HISTORY:  Subjective/Chief Complaint Needing adequate IV access   History of Present Illness 54 y.o with htn, cad, cva presenst with cp, esrd, some decrease in responsivenss Found to have acute non st mi- asa, metoprolol, on iv heparin - cardiology, possible heart cath tomorrow Decrease in responsivenss- possible due to recent broncthis- continue iv abx, h/o cva, mri of brain r/o acute cva- alert now ESRD on hemodialysis via left AV fistula  Needing more adequate IV access. We were consulted for central line placement.   PAST MEDICAL/SURGICAL HISTORY:  Past Medical History:   Hepatitis C:    fistula for dialysis left upper arm:    Multi-drug Resistant Organism (MDRO): 14-Oct-2010   dialysis:    CVA: Dec 2011   Renal Failure:    HTN:    stent-cardiac:    MI:    Neck surgery:    Dialysis catheter right chest:   ALLERGIES:  Allergies:  Morphine: Rash  Adhesive: Blisters  Latex: Blisters, Hives  HOME MEDICATIONS:  Home Medications: Medication Instructions Status  losartan 50 mg oral tablet 1 tab(s) orally once a day Active  metoprolol succinate 50 mg oral tablet, extended release 1 tab(s) orally once a day Active  Aspir 81 oral delayed release tablet 1 tab(s) orally once a day Active  pantoprazole 40 mg oral delayed release tablet 1 tab(s) orally once a day Active  sevelamer 800 mg oral tablet 3 tab(s) orally 3 times a day Active  predniSONE 20 mg oral tablet 1 tab(s) orally once a day  before HD Active  hydrALAZINE 25 mg oral tablet 1 tab(s) orally 3 times a day Active  gabapentin 300 mg oral capsule 1 cap(s) orally once a day before HD Active  isosorbide mononitrate 60 mg oral tablet, extended release 1 tab(s) orally once a day (in the morning) Active  Sensipar 30 mg oral tablet 1 tab(s) orally once a day Active   Family and Social History:  Family History Hypertension   Social History negative ETOH   Review of Systems:  Fever/Chills  No   Abdominal Pain No   Diarrhea No   Constipation No   Nausea/Vomiting No   Chest Pain on admission   Physical Exam:  GEN no acute distress   HEENT PERRL, hearing intact to voice, moist oral mucosa   NECK supple  No masses   RESP decreased breath sounds at bases   CARD regular rate   VASCULAR ACCESS AV fistula present  Good bruit  Good thrill  left arm AVF   ABD denies tenderness  soft  normal BS   LYMPH negative neck   EXTR negative cyanosis/clubbing   SKIN normal to palpation   NEURO cranial nerves intact   PSYCH alert   ASSESSMENT AND PLAN:  Assessment/Admission Diagnosis 54 y.o with htn, cad, cva presenst with cp, esrd, some decrease in responsivenss 1. acute non st mi- asa, metoprolol, on iv heparin - cardiology, possible heart cath tomorrow 2. decrease in responsivenss- possible due to recent broncthis- continue iv abx, h/o cva, mri of brain r/o acute cva 3. htn resume metoprolol 4. esrd- nephorlogy on board, left arm AV fistula 5. hyperlipemia on simvastatin 6. chronic hep c  7. gerd   Needing more adequate IV access. We were consulted for central line placement. This was placed right jugular at bedside with ultrasound guidance.   Electronic Signatures: Su Grand (PA-C)  (Signed 26-Apr-15 20:47)  Authored: Chief Complaint and History, PAST MEDICAL/SURGICAL HISTORY, ALLERGIES, HOME  MEDICATIONS, Family and Social History, Review of Systems, Physical Exam, Assessment and Plan   Last Updated: 26-Apr-15 20:47 by Su Grand (PA-C)

## 2014-10-06 NOTE — Consult Note (Signed)
PATIENT NAME:  [name], [name] MR#:  [id number] DATE OF BIRTH:  Apr 05, 1961  DATE OF CONSULTATION:  11/26/2013  CONSULTING PHYSICIAN:  Isaias Cowman, MD  PRIMARY CARE PHYSICIAN: Dr. Brynda Greathouse.   CARDIOLOGIST: Dr. Clayborn Bigness.   CHIEF COMPLAINT: Chest pain.   HISTORY OF PRESENT ILLNESS: The patient is a 54 year old female with known history of coronary artery disease, status post prior coronary stents, as well as end-stage renal disease, on chronic hemodialysis. The patient underwent recent cardiac catheterization on 10/09/2013, which revealed insignificant disease in the left anterior descending and left circumflex coronary artery. There is diffuse 90% and 75% in-stent restenosis in the right coronary artery, which looks similar to a prior cardiac catheterization from 05/25/2012, which was felt to be best treated medically. The patient apparently has had difficulty with recent dialysis with fluid retention. She presented to [facility] Emergency Room with a chief complaint of chest pain. Initial troponin was 0.17. Follow-up troponin was 0.69.   PAST MEDICAL HISTORY: 1. Coronary artery disease, status post prior coronary stents with diffuse in-stent restenosis of right coronary artery by recent cardiac catheterization, 10/09/2013, felt to be best treated medically.  2. Ischemic cardiomyopathy.  3. Endstage renal disease, on chronic hemodialysis.  4. Hypertension.  5. Hyperlipidemia.  6. History of CVA.  7. Hepatitis C.  8. Gastroesophageal reflux disease.   MEDICATIONS: Aspirin 81 mg daily, metoprolol succinate 50 mg daily, losartan 50 mg daily, isosorbide mononitrate 60 mg daily, hydralazine 25 mg t.i.d., gabapentin 300 mg daily. Sensipar 30 mg daily, prednisone 20 mg daily on days of hemodialysis, pantoprazole 40 mg daily.   SOCIAL HISTORY: The patient currently lives at home with her daughter. She smokes 1/2 pack of cigarettes a day.   FAMILY HISTORY: No immediate family history of coronary  artery disease or myocardial infarction.   REVIEW OF SYSTEMS:  CONSTITUTIONAL: No fever or chills.  EYES: No blurry vision.  EARS: No hearing loss.  RESPIRATORY: No shortness of breath.  CARDIOVASCULAR: Chest discomfort as described above.  GASTROINTESTINAL: The patient does have some intermittent nausea and vomiting.  GENITOURINARY: No dysuria or hematuria.  ENDOCRINE: No polyuria or polydipsia.  HEMATOLOGICAL: No easy bruising or bleeding.  INTEGUMENTARY: No rash.  MUSCULOSKELETAL: No arthralgias or myalgias.  NEUROLOGICAL: No focal muscle weakness or numbness.  PSYCHOLOGICAL: No depression or anxiety.   PHYSICAL EXAMINATION: VITAL SIGNS: Blood pressure 130/100, pulse 96, respirations 14.  HEENT: Pupils equal, reactive to light and accommodation.  NECK: Supple without thyromegaly.  LUNGS: Clear.  HEART: Normal JVP. Normal PMI. Regular rate and rhythm. No S1, S2. No appreciable gallop, murmur, or rub.  ABDOMEN: Soft and nontender. Pulses were intact bilaterally.  MUSCULOSKELETAL: Normal muscle tone.  NEUROLOGIC: The patient is very somnolent on physical examination. Motor and sensory were both grossly intact.   IMPRESSION: This is a 54 year old female with known coronary artery disease, with recent cardiac catheterization demonstrating diffuse in-stent restenosis right coronary artery. The patient has borderline elevated troponin, certainly consistent with her known coronary anatomy as well as end-stage renal disease, on chronic hemodialysis.   RECOMMENDATIONS: 1. Continue current medications.  2. Would defer full dose anticoagulation at this time.   3. We will uptitrate maximize antianginal therapy.  4. Will likely defer any further cardiac diagnostics since the patient just had recent cardiac catheterization with diffuse right coronary artery disease, which appeared unchanged compared to prior cardiac catheterization.   ____________________________ Isaias Cowman,  MD ap:sg D: 11/26/2013 11:05:25 ET T: 11/26/2013 13:41:53 ET JOB#: [id number]  cc: Isaias Cowman, MD, <Dictator> Isaias Cowman MD ELECTRONICALLY SIGNED 11/28/2013 8:32

## 2014-10-06 NOTE — Consult Note (Signed)
PATIENT NAME:  [name], PENADO MR#:  [id number] DATE OF BIRTH:  11/03/1960  DATE OF CONSULTATION:  02/09/2014  REFERRING PHYSICIAN:   CONSULTING PHYSICIAN:  Corey Skains, MD, Dr. Posey Pronto  REASON FOR CONSULTATION: Acute subendocardial myocardial infarction.   CHIEF COMPLAINT: "I have chest pain."   HISTORY OF PRESENT ILLNESS: This is a 54 year old female with known coronary artery disease status post multiple stents in the right coronary artery in the past, who has had hypertension, hyperlipidemia, and chronic kidney disease on dialysis on appropriate treatment. The patient has had an episode of hyperkalemia and concerns of dialysis for which she had previously had an elevated BNP as well of 8836. Therefore, after treatment the patient did improve with her hyperkalemia and was discharged home, but then had dialysis with substernal chest discomfort radiating into her back, not relieved by typical nitroglycerin, and it was concerning for acute myocardial infarction for which she had a troponin of 2.5 and an additional EKG showing normal sinus rhythm, preventricular contractions, left atrial enlargement, left ventricular hypertrophy with T wave inversions. This is consistent with a subendocardial myocardial infarction with a heart catheterization earlier this year showing right coronary artery with diffuse in-stent stenosis and nonsignificant issues with circumflex and left anterior descending artery appropriately treated with medications. Currently, now she is relieved but is needing further intervention.   REVIEW OF SYSTEMS: The remainder of review of systems is negative for vision change, ringing in the ears, hearing loss, cough, congestion, heartburn, nausea, vomiting, diarrhea, bloody stools, stomach pain, extremity pain, leg weakness, cramping of the buttocks, known blood clots, headaches, blackouts, dizzy spells, nosebleeds, congestion, trouble swallowing, frequent urination, urination at night,  muscle weakness, numbness, anxiety, depression, skin lesions, rashes.    PAST MEDICAL HISTORY:  1.  Coronary artery disease.  2.  Myocardial infarction.  3.  Hypertension.  4.  Hyperlipidemia.  5.  Chronic kidney disease with dialysis.  FAMILY HISTORY: Multiple family members with diabetes, as well as coronary artery disease.   SOCIAL HISTORY: Currently denies alcohol or tobacco use.   ALLERGIES: As listed.   MEDICATIONS: As listed.   PHYSICAL EXAMINATION: VITAL SIGNS: Blood pressure is 110/68 bilaterally, heart rate is 70, upright, reclining, and regular.  GENERAL: She a well-appearing female in no acute distress.  HEAD, EYES, EARS, NOSE, AND THROAT: No icterus, thyromegaly, ulcers, hemorrhage or xanthelasma.  CARDIOVASCULAR: Regular rate and rhythm. Normal S1 and S2. Diffuse PMI. Carotid upstroke normal without bruit. Jugular venous pressure is normal.  LUNGS: Have few basilar crackles with normal respirations.  ABDOMEN: Soft, nontender, without hepatosplenomegaly or masses. Abdominal aorta not felt or heard.  EXTREMITIES: The patient does have shunt with trace lower extremity edema. No cyanosis, clubbing, or ulcers.  NEUROLOGIC: She is oriented to time, place, and person with normal mood and affect.   ASSESSMENT: A 54 year old female with dialysis, left ventricular hypertrophy, coronary artery disease, hypertension, hyperlipidemia with acute subendocardial myocardial infarction or non-ST elevation myocardial infarction with an elevated troponin, most consistent with probable right coronary artery diffuse disease and stressors from dialysis.   RECOMMENDATIONS: 1.  Further consultation and discussion of right coronary artery and medical management of which likely is best probable treatment.  2.  Heparin for 24 to 48 hours for acute subendocardial myocardial infarction.  3.  Continue nitrates for chest discomfort.  4.  Beta blocker, aspirin, Plavix if able for myocardial infarction.   5.  Begin ambulation and/or repeat dialysis following for any further significant symptoms and need  for the possibility of acute intervention, but if able would continue medication management due to diffuse disease of the right coronary artery at stent site.    ____________________________ Corey Skains, MD bjk:at D: 02/09/2014 08:59:35 ET T: 02/09/2014 09:33:01 ET JOB#: [id number]  cc: Corey Skains, MD, <Dictator> Corey Skains MD ELECTRONICALLY SIGNED 02/12/2014 7:59

## 2014-10-07 NOTE — Op Note (Signed)
PATIENT NAME:  ARILLA, FLIS MR#:  [id number] DATE OF BIRTH:  02-Mar-1961  DATE OF PROCEDURE:  12/16/2011  PREOPERATIVE DIAGNOSES:  1. Endstage renal disease.  2. Poorly functioning left arm AV fistula.  3. Hypertension.  4. Hepatitis.   POSTOPERATIVE DIAGNOSES:  1. Endstage renal disease.  2. Poorly functioning left arm AV fistula.  3. Hypertension.  4. Hepatitis.   PROCEDURE: 1. Ultrasound guidance for vascular access to left arm AV fistula.  2. Left upper extremity fistulogram and central venogram.  3. Percutaneous transluminal angioplasty of recurrent stenosis in basilic vein at the proximal distal end of the previously placed stent with 8 and 10-mm diameter angioplasty balloons.  4. Percutaneous transluminal angioplasty of innominate vein with 8 and 10- mm diameter angioplasty balloons.   SURGEON: Algernon Huxley, M.D.   ANESTHESIA: Local with moderate conscious sedation.   ESTIMATED BLOOD LOSS: Minimal.   FLUOROSCOPY TIME: 2.8 minutes.   CONTRAST USED: 35 mL.   INDICATION FOR PROCEDURE: This is a dialysis patient well known to our service. She has had poor flows with her access recently. She has had previous interventions. This is a brachial basilic AV fistula. We are asked to perform a fistulogram. The risks and benefits are discussed. Informed consent was obtained.   DESCRIPTION OF PROCEDURE: The patient is brought to the vascular interventional radiology suite. After an adequate level of intravenous sedation was obtained with a Precedex drip and IV Versed, her arm was sterilely prepped and draped and a sterile surgical field was created. The ultrasound machine was used to visualize the fistula and this was accessed a couple centimeters beyond the anastomosis with a micropuncture needle. Micropuncture wire and sheath were then placed. I up-sized to a 6 French sheath and imaging was performed. This showed approximately 80% stenosis at the distal end of the previously placed  stent and approximately 50 to 60% stenosis at the proximal end of the previously placed stent. More centrally there were significant collaterals around what appeared to be an 80% or so innominate vein stenosis in the left innominate. I was able to cross these lesions with a Magic torque wire and a Kumpe catheter, replace the wire, and initially treated the basilic vein for the recurrent stenosis with an 8-mm diameter balloon. This was inflated twice with some residual stenosis and this was treated with a 10-mm diameter angioplasty balloon and at the distal end of the stent. The proximal end was not retreated. Following this, there was no significant residual stenosis and it was only in the 10% to 20% range in the distal end of the stent. In terms of the central venous circulation, similar 8 and then 10-mm diameter angioplasty balloons were inflated and completion angiogram showed significantly improved flow through there without significant residual stenosis and a decrease in the degree of collateralization around this lesion. At this point I elected to terminate the procedure. A 4-0 Monocryl pursestring suture was placed around the sheath exit site. Pressure was held. Sterile dressing was placed. The patient tolerated the procedure well and was taken to the recovery room in stable condition.     ____________________________ Algernon Huxley, MD jsd:bjt D: 12/16/2011 13:19:09 ET T: 12/16/2011 14:05:57 ET JOB#: [id number]  cc: Algernon Huxley, MD, <Dictator> Algernon Huxley MD ELECTRONICALLY SIGNED 12/17/2011 15:54

## 2014-10-10 NOTE — Consult Note (Signed)
PATIENT NAME:  [name], [name] MR#:  [id number] DATE OF BIRTH:  11-20-1960  DATE OF CONSULTATION:  06/13/2014  REFERRING PHYSICIAN:  Monica Becton, MD CONSULTING PHYSICIAN:  Kamesha Herne D. Clayborn Bigness, MD  PRIMARY CARE PHYSICIAN:  Nicky Pugh, MD   INDICATION: Known coronary disease with congestive heart failure and pericardial effusion with abdominal discomfort.  HISTORY OF PRESENT ILLNESS:  The patient is a 54 year old black female with multiple medical problems, coronary artery disease, status post coronary bypass surgery in September at [facility], complicated by large pericardial effusion, hypertension, subsequent hypotension, end-stage renal disease, recurrent chest pain, history of CVA, and bipolar disorder.  The patient has been doing reasonably well since her last visit where she had a pericardial window and drainage of the fluid.  She states she has been compliant with her dialysis treatment.  She presented with abdominal discomfort, nausea and vomiting, which happened right after dialysis about the umbilical area, [id number] and sharp in nature. She had multiple episodes of vomiting, finally came to the Emergency Room and was found to have a lipase of over 1000. Ultrasound of the right upper quadrant suggested cholelithiasis without evidence of cholecystitis. The patient denies alcohol consumption, but she was admitted for pancreatitis.   PAST MEDICAL HISTORY: Coronary artery disease, cardiomyopathy, congestive heart failure, end-stage renal disease, appendicitis, CTA, pericardial effusion, bipolar disorder, hypertension, history of GI bleeding in the past.   PAST SURGICAL HISTORY:  AV fistula for dialysis, coronary artery bypass surgery, pericardiocentesis and window.   ALLERGIES: MORPHINE, ADHESIVE TAPE.   HOME MEDICATIONS: Coumadin 2.5 daily, alternating with 3.75, Sensipar 30 mg once a day, oxycodone 50 mg fast tablet every 6 hours as needed,  Renvela 800 mg 2 tablets 3 times a day, Protonix 40  mg twice a day,  midodrine 5 mg 2 times a day, magnesium oxide 400 mg once a day, hydroxyzine 1 tablet twice a day, atorvastatin 40 mg once a day, aspirin 81 mg a day, amiodarone 200 mg a day.   SOCIAL HISTORY: Denies smoking or alcohol consumption. lives with her 24 year old daughter.   FAMILY HISTORY: Prostatic cancer, hypertension.   REVIEW OF SYSTEMS: Denies blackout spells or syncope.  She has had nausea and vomiting. No diarrhea, denies fever, chills, sweats. No weight loss or weight gain, hemoptysis,   No vision change or hearing change.  Denies any significant sputum production or cough.  PHYSICAL EXAMINATION:   VITAL SIGNS:  Blood pressure 120/80, pulse 80, respiratory  afebrile. HEENT: Normocephalic, atraumatic. Pupils reactive to light.  NECK: Supple. No significant JVD, bruits, adenopathy. <<MISSING  RESPIRATORY:  No wheezing, rhonchi, or rales. HEART:  Regular rate and rhythm.  ABDOMEN:  Benign.  EXTREMITIES: No deformities.  NEUROLOGIC:  Intact.  LABORATORY DATA:   BUN 19, creatinine of 4.35, ultrasound shows cholelithiasis. No evidence of cholecystitis.  Lipase greater than 1000.  CBC is normal.  EKG showing sinus rhythm, nonspecific inferior changes.   ASSESSMENT: Acute pancreatitis, cholelithiasis, end-stage renal disease, hypertension, coronary artery disease, congestive heart failure, history of cardiomyopathy, atrial fibrillation, bipolar disorder. There is no pericardial effusion, hypertension.   PLAN:  Agree with admit. Continue gastrointestinal treatment and therapy. Consider whether surgical intervention is necessary. Continue to withhold diet until pancreatitis improves.  I am concerned this may be related to gallstones and she may pass the stone.  Continue pain management, mild gentle hydration, follow the patient to get dialysis. Would follow gastrointestinal work-up.  The patient is on Coumadin no interventional procedure at this time would  be appropriate until the  Coumadin, possibly laparoscopic cholecystectomy or ERCP.  There is no cardiac intervention necessary at this point as well, continue rate control and  control with amiodarone.  Continue dialysis therapy as necessary Tuesday, Thursday and Saturday.  Follow-up nephrology. Continue PT therapy.  No significant anginal symptoms currently.  The patient's congestive heart failure appears to be relatively complicated, so continue current care. We will maintain relative conservative cardiology input at this point unless surgical intervention is necessary. Again would have to stop the Coumadin  amiodarone.      ____________________________ Loran Senters. Clayborn Bigness, MD ddc:DT D: 06/14/2014 09:14:00 ET T: 06/14/2014 09:54:13 ET JOB#: [id number]  cc: Azaela Caracci D. Clayborn Bigness, MD, <Dictator> Yolonda Kida MD ELECTRONICALLY SIGNED 07/04/2014 14:46

## 2014-10-10 NOTE — Discharge Summary (Signed)
PATIENT NAME:  [name], [name] MR#:  [id number] DATE OF BIRTH:  1961/02/25  DATE OF ADMISSION:  06/13/2014 DATE OF DISCHARGE:    For a detailed note please take a look at the history and physical done on admission by Dr. Lunette Stands.   DIAGNOSES AT DISCHARGE:  1.  Acute pancreatitis.  2.  Renal disease on hemodialysis Tuesday, Thursday, Saturday.  3.  Coronary artery disease.  4.  Hypertension.  5.  Hyperlipidemia. 6.  History of previous cerebrovascular accident.  7.  Secondary hyperparathyroidism.   DIET: The patient is being discharged on a renal diet.   ACTIVITY: As tolerated.   FOLLOWUP:   With Dr. Brynda Greathouse in the next 1-2 weeks.   DISCHARGE MEDICATIONS:  Aspirin 81 mg daily, Sensipar 30 mg daily, hydroxyzine 50 mg b.i.d. as needed, amiodarone 200 mg daily, Roxicodone 15 mg half a tablet q. 6 hours as needed for pain, warfarin 2.5 along with 3.75 mg daily, Protonix 40 mg b.i.d., midodrine 5 mg t.i.d., magnesium oxide 400 mg daily, atorvastatin 40 mg at bedtime, Renvela 800 mg 2 tabs t.i.d., docusate 1 tab at bedtime as needed for constipation.   [place] COURSE: Dr. Juleen China from nephrology, Dr. Bronson Ing from general surgery.   PERTINENT STUDIES DONE DURING THE HOSPITAL COURSE: An ultrasound of the abdomen done showing cholelithiasis, no evidence of cholecystitis.  BRIEF HOSPITAL COURSE: This is a 54 year old female who presented to the hospital with abdominal pain, nausea, vomiting, and noted to have a mildly elevated lipase consistent with acute pancreatitis.   1.  Acute pancreatitis. The exact etiology of pancreatitis is still unclear. It was suspected to be biliary in nature although the patient's abdominal ultrasound showed no acute pathology. The patient was maintained on supportive care with IV fluids, antiemetics,, and pain control. A surgical consult was obtained. The patient was seen by Dr. Bronson Ing, he did think that the patient probably had suspected  biliary pancreatitis, but due to her current medical problems including a recent MI and coronary artery bypass surgery she was not an ideal surgical candidate, therefore she was maintained on supportive care. Her clinical symptoms have improved since then. At this point the patient is tolerating a renal diet without any evidence of worsening abdominal pain, nausea, vomiting, therefore being discharged home with close followup with his surgery as an outpatient for possible need for outpatient cholecystectomy.   2.  End-stage renal disease on hemodialysis. The patient was maintained on her dialysis, nephrology was consulted.  3.  Hypoglycemia. The patient received some dextrose solution. The reason she became hypoglycemic was because of the fact she was n.p.o. but this has resolved now.  4.  Coronary disease status post bypass. The patient had no acute chest pain. She will continue her aspirin and statin as stated.  5.  History of previous CVA.  The patient was maintained on her Coumadin, she will resume that.   6.  Secondary hyperparathyroidism. The patient will continue on her [place] as stated.   CODE STATUS:  The patient is a full code.   DISPOSITION: She is being discharged home.   TIME SPENT: 40 minutes.      ____________________________ Belia Heman. Verdell Carmine, MD vjs:bu D: 06/15/2014 14:24:27 ET T: 06/15/2014 14:57:20 ET JOB#: [id number]  cc: Belia Heman. Verdell Carmine, MD, <Dictator> Mikeal Hawthorne. Brynda Greathouse, MD Henreitta Leber MD ELECTRONICALLY SIGNED 07/10/2014 10:42

## 2014-10-10 NOTE — H&P (Signed)
PATIENT NAME:  [name], [name] MR#:  [id number] DATE OF BIRTH:  07-17-1960  DATE OF ADMISSION:  06/13/2014  PRIMARY CARE PHYSICIAN: [name], MD  PRIMARY CARDIOLOGIST: Dwayne D. Clayborn Bigness, MD  REFERRING PHYSICIAN: [place] Owens Shark, MD  CHIEF COMPLAINT: Nausea, vomiting, abdominal pain.  HISTORY OF PRESENT ILLNESS: [name] is a 53 year old female with a history of coronary artery disease status post coronary artery bypass grafting in September [id number] complicated by large pericardial effusion and hypertension requiring transfer to [facility]. Also, has history of end-stage renal disease on hemodialysis comes to the Emergency Department with complaints of abdominal pain, nausea, vomiting started since this afternoon after having dialysis. The pain is around the umbilical area, [id number] intensity, sharp in nature. This is associated with multiple episodes of vomiting; concerning this, came to the Emergency Department. Workup in the Emergency Department the patient is found to have elevated lipase of 1100. Ultrasound of the right upper quadrant revealed cholelithiasis without any evidence cholecystitis. The patient denies drinking any alcohol.   PAST MEDICAL HISTORY: 1.  Coronary artery disease status post stent placement in the past and coronary artery bypass grafting. 2.  End-stage renal disease on hemodialysis on Tuesday, Thursday, Saturday. 3.  History of hepatitis. 4.  History of CVA in the past.  PAST SURGICAL HISTORY: 1.  AV fistula. 2.  Coronary artery bypass grafting.  ALLERGIES:  1.  MORPHINE.  2.  ADHESIVE TAPE.  3.  LATEX.  HOME MEDICATIONS:  1.  Coumadin 2.5 mg daily on 3.75 mg on alternate days. 2.  Sensipar 30 mg once a day. 3.  Oxycodone 15 mg half a tablet every 6 hours as needed. 4.  Renvela 800 mg 2 tablets 3 times a day. 5.  Protonix 40 mg 2 times a day. 6.  Midodrine 5 mg 3 times a day. 7.  Magnesium oxide 400 mg once a day. 8.  Hydroxyzine 1 tablet  2 times a day. 9.  Atorvastatin 40 mg once a day. 10.  Aspirin 81 mg daily. 11.  Amiodarone 200 mg once a day.  SOCIAL HISTORY: Denies smoking; however, former smoker. Denies drinking any alcohol or using illicit drugs. Currently lives with her 41 year old daughter.  FAMILY HISTORY: Prostatic cancer and hypertension.  REVIEW OF SYSTEMS: CONSTITUTIONAL: Denies any generalized weakness. EYES: No change in vision.  EAR, NOSE, THROAT: No change in hearing. CARDIOVASCULAR: No chest pain, palpitations. GASTROINTESTINAL: Has nausea, vomiting, abdominal pain. GENITOURINARY: No dysuria or hematuria. SKIN: No rash or lesions. MUSCULOSKELETAL: No joint pains and aches. HEMATOLOGIC: No easy bruising or bleeding. NEUROLOGICAL: No weakness or numbness in any part of the body.  PHYSICAL EXAMINATION:  GENERAL: This is a well-built, well-nourished age appropriate female lying down in the bed not in distress. VITAL SIGNS: Temperature 98.3, pulse 82, blood pressure 119/80, respiratory rate of 14, oxygen saturation 100% on room air. HEENT: Head normocephalic, atraumatic. There is no scleral icterus. Conjunctivae normal. Pupils equal, round, react to light. Mucous membranes mild dryness. NECK: Supple. No lymphadenopathy. No JVD. No carotid bruit. CHEST: No focal tenderness. LUNGS: Bilaterally clear to auscultation. HEART: S1, S2 regular. No murmurs are heard. ABDOMEN: Bowel sounds present. Soft. Has tenderness about the umbilical area. No guarding or rebound tenderness. Could not appreciate any hepatosplenomegaly. EXTREMITIES: No pedal edema. Pulses 2+. NEUROLOGIC: Patient is alert, oriented to place, person, and time. Cranial nerves II-XII intact. Motor 5/5 in upper and lower extremities.  LABORATORY DATA: BUN 19, creatinine of 4.35; the rest of  the values are within normal limits. Ultrasound of the abdomen: Cholelithiasis no evidence of cholecystitis. Lipase 1044. CBC is completely within normal  limits.   ASSESSMENT AND PLAN: [name] is a 54 year old female who comes to the Emergency Department with nausea, vomiting, abdominal pain and is found to have pancreatitis, gallstone-induced. 1.  Acute pancreatitis, gallstone-induced: Keep the patient n.p.o. Continue with intravenous fluids. Will be cautious while watching the patient's respiratory function closely. Follow up with lipase in the morning. Continue to provide pain medications as needed. 2.  Cholelithiasis: Consult general surgery. Patient had coronary artery bypass grafting done 3 months back. Patient has ischemic cardiomyopathy with ejection fraction of 25% and has end-stage renal disease. Considering patient's complexity consider getting a cardiology consult for the preoperative clearance. 3.  End-stage renal disease on hemodialysis Tuesday, Thursday, Saturday: Consult nephrology. 4.  Hypertension: Currently well controlled. 5.  Coronary artery disease: Continue with aspirin. Patient is on Coumadin for uncertain reason. We will hold the Coumadin.  TIME SPENT: 55 minutes.   ____________________________ Monica Becton, MD pv:bm D: 06/13/2014 04:12:33 ET T: 06/13/2014 05:05:00 ET JOB#: [id number]  cc: Monica Becton, MD, <Dictator> Mikeal Hawthorne. Brynda Greathouse, MD Monica Becton MD ELECTRONICALLY SIGNED 06/23/2014 23:45

## 2014-10-10 NOTE — Consult Note (Signed)
PATIENT NAME:  [name], KULCZYK MR#:  [id number] DATE OF BIRTH:  19-Mar-1961  DATE OF CONSULTATION:  06/13/2014  REFERRING PHYSICIAN:   CONSULTING PHYSICIAN:  Micheline Maze, MD  PRIMARY CARE PHYSICIAN: Mikeal Hawthorne. Brynda Greathouse, MD  ADMITTING PHYSICIAN:  [facility].  CHIEF COMPLAINT: Abdominal pain, nausea, and vomiting.   BRIEF HISTORY: [name] is a 54 year old woman with multiple medical problems, admitted to the hospital with significant nausea, abdominal pain, and vomiting. She states that this pain is a new pain, and she has not had similar symptoms in the past. She does have a history of epigastric pain in the past. It has always been related to her cardiac status, and this episode is new and significantly different. She has had profound nausea and marked vomiting.   She has significant history of coronary artery disease with myocardial infarction in August, followed by coronary artery bypass. She had failed stents in the distant past. Cardiac bypass was performed in September, and she was transferred to [facility] in October, following an admission pericardial effusion and marked hypotension. In addition, she has a long-standing history of end-stage chronic renal disease, currently on Tuesday, Thursday, Saturday dialysis with the [facility]. She is currently on anticoagulation with Coumadin and has an INR of 1.5. Work-up in the Emergency Room revealed a lipase of 1000. Liver function studies were not elevated. Bilirubin 0.6 alkaline phosphatase was 94. Hemoglobin was 11, white blood cell count was 4900. Ultrasound revealed no evidence of any acute cholecystitis, but at least 1 gallstone and possibly a small polyp.   PAST MEDICAL HISTORY: Significant for hepatitis and stroke, in addition to the above-mentioned problems. She has had a previous AV fistula in the left arm and recent coronary bypass grafting.   MEDICATIONS: Outlined in her admission note dated today. Of note is the fact she is  on aspirin and Coumadin.   SOCIAL HISTORY: She is not a cigarette smoker currently, but she was a former smoker. She is disabled at the present time.   REVIEW OF SYSTEMS:  Otherwise unremarkable.   PHYSICAL EXAMINATION: GENERAL: She is sleeping comfortably in bed, does not complain of any significant abdominal pain when awakened. Her temperature is normal, blood pressure is 120/75, heart rate is 80 and regular.  HEENT: No scleral icterus. No pupillary abnormalities.  NECK: Supple. Nontender, midline trachea.  CHEST: Clear with no adventitious sounds. She has normal pulmonary excursion.  CARDIAC: Reveals no murmurs or gallops to my ear, but the heart sounds very distant. She appears to be in normal sinus rhythm.  ABDOMEN: Soft, nontender with no organomegaly, no rebound, no guarding. She has no hernias noted.  EXTREMITIES: Reveals some mild lower extremity edema. She has an AV fistula in the left arm.  PSYCHIATRIC: Normal orientation, normal affect. She is hungry and wants to eat.   IMPRESSION: This woman appears to have biliary pancreatitis. With her multiple medical problems, particularly her recent myocardial infarction and coronary bypass, she is not an ideal surgical candidate, and surgery would be prophylactic rather than therapeutic in this situation. At the current time, I do not see any indication for surgical intervention. We would recommend symptomatic treatment for her pancreatitis. Follow-up as an outpatient. We would love to have her off Coumadin and aspirin for while in order to provide a better result for her surgery. We will continue to follow her while she is hospitalized. I would be happy to see her as an outpatient.    ____________________________ Calvert Cantor.  Malon Kindle, MD rle:mw D: 06/13/2014 11:12:24 ET T: 06/13/2014 11:38:04 ET JOB#: [id number]  cc: Micheline Maze, MD, <Dictator> Mikeal Hawthorne. Brynda Greathouse, MD Rodena Goldmann MD ELECTRONICALLY SIGNED 06/15/2014 23:07

## 2014-10-14 NOTE — Discharge Summary (Signed)
PATIENT NAME:  [name], [name] MR#:  [id number] DATE OF BIRTH:  [birth year]  DATE OF ADMISSION:  07/03/2014 DATE OF DISCHARGE:  07/04/2014   PRIMARY CARE PHYSICIAN: [name], MD  DISCHARGE DIAGNOSES:  1.  Acute gastritis with nausea and vomiting.  2.  Hyperkalemia.  3.  End-stage renal disease.  4.  Hypertension.  5.  History of cerebrovascular accident.  6.  Coronary artery disease.   CONDITION: Stable.   CODE STATUS: Full Code.   HOME MEDICATIONS: Please refer to the medication reconciliation list.   The patient needs home health with nurse.   DIET: Low-sodium, low-fat, low-cholesterol, renal diet.   ACTIVITY: As tolerated.   FOLLOWUP CARE: Follow up with PCP and Dr. Candiss Norse, nephrologist, within 1-2 weeks.   HOSPITAL COURSE: The patient is a 55 year old African American female with a history of ESRD, CAD, and CVA, who presented to the ED with nausea and vomiting for 2 days. In addition, the patient complained of generalized abdominal pain. The patient was found to have a high potassium of 6.5, and was treated with a GI cocktail, including calcium, insulin, D50 and Kayexalate.   For detailed history and physical examination, please refer to the admission note dictated by Dr. Margaretmary Eddy.   On the admission date, the patient's laboratory data showed glucose 90, BUN 98, BUN 39, creatinine 7.43 potassium 6.5, bicarbonate 23, lipase 282. Hemoglobin 11, WBC 5.4. INR 1.8.   1.  Acute gastroenteritis with nausea and vomiting. After admission, the patient has been treated with PPI with Zofran p.r.n. The patient's symptoms have much improved.  2.  Hyperkalemia. The patient was treated with Kayexalate and hyperkalemia cocktail. Potassium decreased to 5.7 yesterday, and the patient got hemodialysis yesterday. Potassium decreased to 4.3 today.  3.  End-stage renal disease. The patient is on hemodialysis Tuesday/Thursday/Saturday.  4.  The patient has a history of coronary artery disease  and cerebrovascular accident, on Coumadin. INR is therapeutic at 2.0. The patient complains of headache; otherwise, the patient has no complaints.   Her vital signs are stable her physical examination is unremarkable. She is clinically stable, and will be discharged to home with home health today.   I discussed the patient's discharge plan with the patient, nurse, and case manager.   TIME SPENT: About 38 minutes.    ____________________________ Demetrios Loll, MD qc:MT D: 07/04/2014 13:00:49 ET T: 07/04/2014 15:06:02 ET JOB#: [id number]  cc: Demetrios Loll, MD, <Dictator> Demetrios Loll MD ELECTRONICALLY SIGNED 07/04/2014 16:59

## 2014-10-14 NOTE — Consult Note (Signed)
Chief Complaint:  Subjective/Chief Complaint Recurrent chest pain history of cardiomyopathy congestive heart failure end-stage renal disease continue medical therapy and antibiotic therapy for infected is shunt.   VITAL SIGNS/ANCILLARY NOTES: **Vital Signs.:   22-Feb-16 11:00  Pulse Pulse 80  Respirations Respirations 11  Systolic BP Systolic BP [id number]  Diastolic BP (mmHg) Diastolic BP (mmHg) 71  Mean BP 81  Pulse Ox % Pulse Ox % 100  Pulse Ox Activity Level  At rest  Oxygen Delivery 4L; Nasal Cannula  *Intake and Output.:   Daily 22-Feb-16 07:00  Grand Totals Intake:  100 Output:      Net:  100 24 Hr.:  100  IV (Secondary)      In:  100  Length of Stay Totals Intake:  2390 Output:      Net:  2390   Brief Assessment:  GEN well developed, well nourished, no acute distress   Cardiac -- JVD   Respiratory normal resp effort  clear BS  no use of accessory muscles  rhonchi   Gastrointestinal Normal   Gastrointestinal details normal Soft  Nontender  Nondistended   EXTR negative cyanosis/clubbing, negative edema, infected shunt in left arm   Lab Results: Routine Coag:  22-Feb-16 04:34   Prothrombin  25.4 (11.4-15.0 NOTE: New Reference Range  07/13/14)  INR 2.3 (INR reference interval applies to patients on anticoagulant therapy. A single INR therapeutic range for coumarins is not optimal for all indications; however, the suggested range for most indications is 2.0 - 3.0. Exceptions to the INR Reference Range may include: Prosthetic heart valves, acute myocardial infarction, prevention of myocardial infarction, and combinations of aspirin and anticoagulant. The need for a higher or lower target INR must be assessed individually. Reference: The Pharmacology and Management of the Vitamin K  antagonists: the seventh ACCP Conference on Antithrombotic and Thrombolytic Therapy. [id number] Sept:126 (3suppl): X2190819. A HCT value >55% may artifactually increase the PT.  In one  study,  the increase was an average of 25%. Reference:  "Effect on Routine and Special Coagulation Testing Values of Citrate Anticoagulant Adjustment in Patients with High HCT Values." American Journal of Clinical Pathology 2006;126:400-405.)   Radiology Results: XRay:    18-Feb-16 23:06, Chest Portable Single View  Chest Portable Single View   REASON FOR EXAM:    fever, shob  COMMENTS:       PROCEDURE: DXR - DXR PORTABLE CHEST SINGLE VIEW  - Aug 02 2014 11:06PM     CLINICAL DATA:  Headache, fever, vomiting and shortness of breath  beginning tonight.    EXAM:  PORTABLE CHEST - 1 VIEW    COMPARISON:  PA and lateral chest 07/07/2014 and 06/23/2014.    FINDINGS:  Dialysis catheter is again seen. Marked enlargement of the  cardiopericardial silhouette is identified and there is pulmonary  vascular congestion. There is increased density in the left lung  base. The lungs are otherwise unremarkable. No pneumothorax  identified.     IMPRESSION:  Increased left basilar density could be due to atelectasis or  pneumonia.    Cardiomegaly and pulmonary vascular congestion.      Electronically Signed    By: Inge Rise M.D.    On: 08/02/2014 23:16     Verified By: Ramond Dial, M.D.,  Cardiology:    18-Feb-16 22:43, ECG  Ventricular Rate 99  Atrial Rate 99  P-R Interval 222  QRS Duration 116  QT 382  QTc 490  P Axis 78  R Axis -15  T Axis 82  ECG interpretation   Sinus rhythm with 1st degree A-V block with occasional Premature ventricular complexes  Left ventricular hypertrophy with QRS widening  T wave abnormality, consider lateral ischemia  Prolonged QT  Abnormal ECG  When compared with ECG of 02-Aug-2014 22:42,  Premature ventricular complexes are now Present  PR interval has increased  Right bundle branch block is no longer Present  ----------unconfirmed----------  Confirmed by OVERREAD, NOT ([id number]), editor PEARSON, BARBARA ([id number]) on 08/03/2014 10:13:06 AM   ECG     20-Feb-16 15:04, ECG  Ventricular Rate 100  Atrial Rate 100  P-R Interval 194  QRS Duration 126  QT 400  QTc 516  P Axis 74  R Axis -14  T Axis 76  ECG interpretation   Sinus rhythm with occasional , and consecutive Premature ventricular complexes and Fusion complexes  Possible Left atrial enlargement  Right bundle branch block  Left ventricular hypertrophy  Inferior infarct , age undetermined  Abnormal ECG  When compared with ECG of 02-Aug-2014 22:43,  Fusion complexes are now Present  Premature ventricular complexes are now Present  Right bundle branch block is now Present  Confirmed by Kathlyn Sacramento ([id number]) on 08/06/2014 9:49:39 AM    Overreader: Kathlyn Sacramento  ECG    Assessment/Plan:  Assessment/Plan:  Assessment atypical chest pain  coronary artery disease  congestive heart failure  cardiomyopathy  hypotension  atrial fibrillation  infected shunt  history of pericardial effusion  hyperlipidemia  history of hypertension  end-stage renal disease .   Plan agree with broad-spectrum antibiotic therapy  continue pain control for atypical chest pain  continue dialysis therapy 5 end-stage renal disease  continue statin therapy  blood pressure control  recommend change shunt  follow-up with vascular  do not recommend cardiac catheterization or invasive cardiac studies   Electronic Signatures: Yolonda Kida (MD)  (Signed [phone] 18:46)  Authored: Chief Complaint, VITAL SIGNS/ANCILLARY NOTES, Brief Assessment, Lab Results, Radiology Results, Assessment/Plan   Last Updated: 22-Feb-16 18:46 by Yolonda Kida (MD)

## 2014-10-14 NOTE — Discharge Summary (Signed)
PATIENT NAME:  [name], [name] MR#:  [id number] DATE OF BIRTH:  Feb 24, 1961  DATE OF ADMISSION:  06/24/2014 DATE OF DISCHARGE:  06/26/2014  ADDENDUM    The patient was not discharged on 06/25/2014 since she did not have any ride to return home so she stayed one more day. She had hemodialysis done 06/26/2014. She was seen also by Dr. Valora Corporal who suggested to start patient on amitriptyline at 25 mg p.o. daily dose. Her headache improved and he felt that her headache very likely was tension headache, which amitriptyline would help. He   With regards to right-sided weakness, it resolved and there was no evidence of any cervical process was contributing to this based on MRI of her cervical spine results. He felt that the patient's moderate cervical disease was likely asymptomatic. However, he felt that the patient would benefit from amitriptyline at 25 mg p.o. at bedtime for headaches as well as she may benefit also from outpatient spine followup for cervical disease. He recommended to continue Amitriptylline, prn fioriocet and avoid narcotics.   The patient felt good today on 06/26/2014. She received hemodialysis as mentioned above and she was ready to be discharged home. On the day of discharge, temperature was 97.7, pulse was 86, respiration rate was 15 to 16, blood pressure 115/79, saturation was 99% on 2 liters of oxygen through nasal cannula and 92% on room air at rest.   TIME SPENT: 40 minutes.   ____________________________ Theodoro Grist, MD rv:AT D: 06/26/2014 16:52:20 ET T: 06/27/2014 01:57:47 ET JOB#: [id number]  cc: Theodoro Grist, MD, <Dictator> Leocadio Heal MD ELECTRONICALLY SIGNED 07/05/2014 10:07

## 2014-10-14 NOTE — H&P (Signed)
PATIENT NAME:  [name], [name] MR#:  [id number] DATE OF BIRTH:  Jan 04, 1961  DATE OF ADMISSION:  07/02/2014  PRIMARY CARE PHYSICIAN: [name], MD   REFERRING EMERGENCY ROOM PHYSICIAN: Debbrah Alar, MD  CHIEF COMPLAINT: Nausea and vomiting.   HISTORY OF PRESENT ILLNESS: The patient is a 54 year old female with a history of end-stage renal disease on hemodialysis on Tuesday, Thursday and Saturday, presenting to the ED with a chief complaint of nausea and vomiting for 2 days. Complaining of generalized abdominal pain. Yesterday, she could not eat anything but today she is still feeling nauseous, vomiting is somewhat better. Denies any diarrhea. Complaining of generalized abdominal pain. Denies any dizziness or loss of consciousness. The patient came into the ED. Her potassium is found to be at 6.5. EKG has revealed nonspecific intraventricular block, which is new when compared to the old EKG. The patient was given GI cocktail with calcium gluconate, 10 units of short-acting insulin and D50. Kayexalate was also ordered. Hospitalist team is called to admit the patient for observation purposes. During my examination, the patient denies any chest pain or shortness of breath, resting comfortably. Nausea is also significantly improved after giving antiemetic medication in the ED. No other complaints. The patient is on Coumadin and reports that it was started at [facility] after CABG procedure done to prevent clots in future.   PAST MEDICAL HISTORY: End-stage renal disease on hemodialysis on Tuesday, Thursday and Saturday. Coronary artery disease, status post CABG, hypertension, history of hepatitis C, history of CVA, on Coumadin.  PAST SURGICAL HISTORY: Status post coronary artery bypass graft placement, AV fistula placement.   ALLERGIES: MORPHINE AND A HISTORY OF TAPE AND LATEX.   SOCIAL HISTORY: Lives at home with daughter. Denies any history of smoking. She used to smoke in the past. Denies  alcohol or illicit drug usage.   FAMILY HISTORY: Prostate cancer and hypertension runs in her family.    HOME MEDICATIONS: Amiodarone 200 mg p.o. once daily, aspirin 81 mg p.o. once daily, atorvastatin 40 mg p.o. at bedtime, hydroxyzine/hydrochloride 50 mg 1 tablet  2 times a day as needed for itching, magnesium oxide 400 mg p.o. once daily, Sensipar 30 mg 1 tablet p.o. 3 times a day, Coumadin 2.5 mg 1 tablet p.o. once daily.   REVIEW OF SYSTEMS:  CONSTITUTIONAL: Denies any fever. Complaining of fatigue and weakness.  EYES: Denies blurry vision, double vision. EARS, NOSE AND THROAT: Denies epistaxis, discharge.  RESPIRATION: Denies cough, COPD. CARDIOVASCULAR: No chest pain, palpitations or syncope.  GASTROINTESTINAL: Complaining of nausea, vomiting. Denies diarrhea. Complaining of generalized abdominal pain. No hematemesis or melena.  GENITOURINARY: No dysuria, hematuria.  GYNECOLOGIC AND BREASTS: Denies breast mass or vaginal discharge. ENDOCRINOLOGY: Denies polyuria, nocturia, thyroid problems. HEMATOLOGIC AND LYMPHATIC: No anemia, easy bruising, bleeding.  INTEGUMENTARY: No acne, rash, lesions.  MUSCULOSKELETAL: No joint pain in the neck and back. Denies any gout.  NEUROLOGIC: Denies vertigo or ataxia. PSYCHIATRIC: normal mood and effect  PHYSICAL EXAMINATION: VITAL SIGNS: Temperature is 98.6, pulse 91, respirations 18, blood pressure 128/91, pulse oximetry 98% to 100% on room air.  GENERAL APPEARANCE: Not in acute distress. Moderately built, well nourished.  HEENT: Normocephalic, atraumatic. Pupils are equally reactive to light and accommodation. No scleral icterus. No conjunctival injection. No sinus tenderness. No postnasal drip. Moist mucous membranes.  NECK: Supple. No JVD. No thyromegaly. Range of motion is intact.  LUNGS: Clear to auscultation bilaterally. No accessory muscle use and no anterior chest wall tenderness on palpation.  CARDIAC: S1, S2 normal. Regular rate and  rhythm. No bruits. No gallops.  GASTROINTESTINAL: Soft. Bowel sounds are positive in all 4 quadrants. Nontender, nondistended. No hepatosplenomegaly. No masses felt. NEUROLOGICAL: Awake, alert, oriented x 3. Cranial nerves II-XII are grossly intact. Motor and sensory are intact. Reflexes are 2+. EXTREMITIES: No edema. No cyanosis. No clubbing.  SKIN: Warm to touch, normal turgor. No rashes. No lesions.  MUSCULOSKELETAL: No joint effusion, tenderness, erythema. PSYCHIATRIC: Normal mood and affect.   LABORATORY AND IMAGING STUDIES: PT is 20.1, INR 1.8. WBC 5.4, hemoglobin 11.1, hematocrit is 36.8, platelets are 186,000. LFTs: Total protein 8.1, albumin 2.9, bili total 0.6, alkaline phosphatase 99, AST 92, ALT 40. Glucose 90, BUN 39, creatinine 7.43. Sodium 134, potassium is 6.5, chloride 102, CO2 of 23, anion gap is 9. GFR 7. Serum osmolality normal. Calcium 7.9. Lipase is 282.   A 12-lead EKG: Normal sinus rhythm at 89 beats per minute, nonspecific intraventricular block, which is new when compared to the old EKG, PR interval is 200, no acute ST-T wave changes. Her cardiologist is Dr. Clayborn Bigness.   ASSESSMENT AND PLAN: A 54 year old female with a history of end-stage renal disease, compliant with her dialysis on Tuesday, Thursday and Saturday, presenting to the ED with a chief complaint of 2 day history of nausea and vomiting, her potassium was found to be at 6.5 with new EKG changes of nonspecific intraventricular blockage which is new.  1.  Acute gastroenteritis with nausea and vomiting. We will provide her antiemetics and proton pump inhibitor. Currently, the patient is not vomiting. We will continue close monitoring. Blood pressure is stable. Not considering any IV fluids at this time, in view of end-stage renal disease.  2.  Hyperkalemia with new nonspecific intraventricular block. The patient denies any chest pain or shortness of breath. Hyperkalemia cocktail with calcium gluconate, short-acting  insulin and D50 was given in the ED. Also, Kayexalate 15 grams was ordered by me. We will repeat potassium level.  3.  End-stage renal disease. Continue hemodialysis on Tuesday, Thursday and Saturday. Nephrology consult is placed.  4.  Coronary artery disease, status post coronary artery bypass graft. She sees Dr. Clayborn Bigness as an outpatient. We will continue aspirin and statin.  5.  History of stroke. The patient is on Coumadin, we will continue the same. Check PT, INR and adjust Coumadin dose as needed.  6.  Hypertension. The patient's blood pressure is stable. The patient is not on any antihypertensive medications.  7.  We will provide gastrointestinal prophylaxis and deep venous thrombosis prophylaxis is not needed as the patient is on Coumadin and INR is at 1.9. We will continue close monitoring of the PT-INR.   CODE STATUS: She is full code. Mom is the medical power of attorney.   Plan of care discussed in detail with the patient and she verbalized understanding of the plan.   TOTAL TIME SPENT: Fifty minutes.    ____________________________ Nicholes Mango, MD ag:TT D: 07/02/2014 20:11:15 ET T: 07/02/2014 21:03:37 ET JOB#: [id number]  cc: Nicholes Mango, MD, <Dictator> Nicholes Mango MD ELECTRONICALLY SIGNED 07/06/2014 18:50
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
## 2014-10-14 NOTE — Op Note (Signed)
PATIENT NAME:  [name], [name] MR#:  [id number] DATE OF BIRTH:  [birth year]  DATE OF PROCEDURE:  07/09/2014  PREOPERATIVE DIAGNOSES:  1.  End-stage renal disease.  2.  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  3.  Hypertension.  4.  History of stroke.   POSTOPERATIVE DIAGNOSES:   1.  End-stage renal disease.  2.  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  3.  Hypertension.  4.  History of stroke.   PROCEDURES: 1.  Ultrasound guidance for vascular access to right internal jugular vein.  2.  Fluoroscopic guidance for placement of catheter.  3. Placement of a 19 cm tip-to-cuff tunneled hemodialysis catheter via the right internal jugular vein.   SURGEON:  Algernon Huxley, M.D.   ANESTHESIA: Local with sedation.   BLOOD LOSS: 25 mL.   INDICATION FOR PROCEDURE: A 54 year old female with end-stage renal disease, which is long-standing. She has a large aneurysmal AV fistula in the left arm, but now had skin breakdown and need for surgical revision.  She needs a PermCath for dialysis access and stop using the fistula until this can be revised and healed.  Risks and benefits were discussed. Informed consent was obtained.   DESCRIPTION OF THE PROCEDURE: The patient was brought to the vascular and interventional radiology suite. The patient's right neck and chest were sterilely prepped and draped and a sterile surgical field was created. The right internal jugular vein was visualized with ultrasound and found to be patent. It was then accessed under direct ultrasound guidance and a permanent image was recorded. A wire was placed. After a skin nick and dilatation, the peel-away sheath was placed over the wire.   I then turned my attention to an area under the clavicle. Approximately 2 fingerbreadths below the clavicle a small counter incision was created and we tunneled from the subclavicular incision to the  access site. Using fluoroscopic guidance, a 19 cm tip-to-cuff tunneled hemodialysis catheter was selected, tunneled from the subclavicular incision to the access site. It was then placed through the peel-away sheath and the peel-away sheath was removed. The catheter tips were parked in the right atrium. The appropriate distal connectors were placed. It withdrew blood well and flushed easily with heparinized saline and a concentrated heparin solution was then placed. It was secured to the chest wall with 2 Prolene sutures. The access incision was closed with a single 4-0 Monocryl. A 4-0 Monocryl pursestring suture was placed around the exit site. Sterile dressings were placed.   The patient tolerated the procedure well and was taken to the recovery room in stable condition.    ____________________________ Algernon Huxley, MD jsd:at D: 07/09/2014 10:07:00 ET T: 07/09/2014 10:13:02 ET JOB#: [id number] Algernon Huxley MD ELECTRONICALLY SIGNED 07/18/2014 11:53

## 2014-10-14 NOTE — Discharge Summary (Signed)
PATIENT NAME:  [name], [name] MR#:  [id number] DATE OF BIRTH:  November 15, 1960  DATE OF DISCHARGE:  06/25/2014  ADMITTING DIAGNOSES: Headache.   DISCHARGE DIAGNOSES: 1.  Headache of unclear etiology at this time, likely tension headache per neurology.  2.  Recent onset of right-sided weakness. No stroke.  Cannot rule out transient ischemic attack or subarachnoid hemorrhage. Refused lumbar puncture. 3.  Hypertension.    4.  Nausea, vomiting, resolved.  5. Clotted left upper extremity arteriovenous fistula, status post fistulogram and PTA and stent placement to basilic vein by Dr. Lucky Cowboy on 06/25/2014. 6.  History of hypertension. 7.  Coronary artery disease, status post coronary artery bypass grafting. 8.  Stroke. 9.  End-stage renal disease, on hemodialysis.  10.  Hepatitis C. 11.  History of chronic anticoagulation with Coumadin therapy, subtherapeutic at present.   DISCHARGE CONDITION: Stable.   DISCHARGE MEDICATIONS: The patient is to continue: 1.  Aspirin 81 mg p.o. daily.  2.  Sensipar 30 mg p.o. daily. 3.  Hydroxyzine hydrochloride 50 mg p.o. twice daily as needed. 4.  Amiodarone 200 mg p.o. daily.  5.  Warfarin 2.5 mg p.o. daily.  6.  Pantoprazole 40 mg p.o. twice daily.  7.  Midodrine 5 mg 3 times daily.  8.  Magnesium oxide 400 mg p.o. daily. 9.  Lipitor 40 mg p.o. daily at bedtime.  10.  Docusate 1 tablet once at bedtime as needed.  11.  Roxicodone 15 mg tablet, 1/2 tablet every 6 hours as needed.  12.  Fioricet 1 tablet every 6 hours as needed.   HOME OXYGEN: None.   DIET: 2 gram salt, low fat, low cholesterol, hemodialysis diet with fluid restriction. Diet consistency: Regular.   ACTIVITY LIMITATIONS: As tolerated.   FOLLOWUP APPOINTMENT: With Dr. Brynda Greathouse in 3 days after discharge, Dr. Lucky Cowboy in 1 day after discharge, hemodialysis per schedule, [facility] in 1 week after discharge.   CONSULTANTS: Care management, social work, Rodman Key C. Tamala Julian, MD, neurologist, Munsoor Lilian Kapur, MD, Algernon Huxley, MD,  Mamie Levers, MD.  RADIOLOGIC STUDIES: Chest x-ray, portable, single view, 06/23/2014, revealed cardiac enlargement. No evidence of active pulmonary disease, possible linear atelectasis in the left lung base behind the heart according to radiologist. CT scan of head without contrast, 06/23/2014,  showed no acute intracranial abnormalities, chronic atrophy and small vessel ischemic changes, old bilateral infarct. No significant change since prior study.  MRI of brain without contrast, 06/24/2014  as well as MRA of brain without contrast and MRV of brain showed motion degraded examination.  No acute intracranial process, multifocal encephalomalacia suggesting remote infarct with tiny multiple vascular territories, small cerebellar infarct, moderate white matter changes suggesting chronic small vessel ischemic disease, advanced for age. MRI of head, motion degraded examination.  No large vessel occlusion, mild luminal irregularity of he mid to distal vessels, may reflect atherosclerosis of motion artifact. MRV of head, motion degraded examination. No dural venous sinus thrombosis was noted. MRI of cervical spine without contrast, 06/25/2014, revealed no acute osseous abnormality identified in the cervical spine, multilevel cervical spondylolisthesis associated with disk endplate and partial  degeneration multifactorial. Moderate and severe neural foraminal stenosis at the left C3, right C4, bilateral C5, and right C6, and bilateral C7 nerve levels. Borderline to mild multifactorial spinal stenosis at C6-C7; no cervical spinal cord mass effect or signal abnormality was noted.   HISTORY OF PRESENT ILLNESS: The patient is a 54 year old African American female with a history of end-stage renal disease,  who presents to the hospital with complaints of acute onset of headache, as well as nausea and vomiting.  Please refer to Dr. Ephriam Jenkins admission note on 06/24/2014.  On arrival to the  hospital, the patient was also complaining of right-sided weakness and she was admitted for observation. The patient's initial vital signs, temperature was 98.4, pulse was 89, respiration was 18, blood pressure 148/97, and saturation was 98% on room air. Physical examination showed AV fistula in left upper extremity. She also was noted to have right upper extremity as well as right lower extremity decreased strength, but complete examination was very difficult to obtain according to admitting physician due to patient's severe pains.  LABORATORY DATA: Done on arrival to the hospital showed elevated glucose level of 118, creatinine of 3.59, potassium 3.3, otherwise BMP was unremarkable. The patient's troponin was mildly elevated at 0.06 on admission. White blood cell count was normal at 4.9, hemoglobin was 11.9, platelet count was 174,000. Erythrocyte sedimentation rate was 45. Coagulation panel: Pro time was 17.4 and INR was 1.5. Coma  overdose profile in serum was checked and it was negative, but some results were still pending at the time of dictation.   EKG showed sinus rhythm at 77 beats per minute, occasional premature ventricular complexes, possible left atrial enlargement, right bundle branch block, left ventricular hypertrophy with QRS widening, and T wave abnormality, considering possible questionable lateral ischemia.   HOSPITAL COURSE The patient was admitted to the hospital for further evaluation. She was evaluated by neurologist the same day, Dr. Valora Corporal saw the patient in consultation, and felt that the patient's headache was of unclear etiology. He could not completely rule out subarachnoid hemorrhage; however, he highly doubted it. He also felt that it could be postictal, but patient denied anything that sounded like seizure. He felt that very likely the patient's headache was a tension headache from stress as apparently the patient has been having stress and multiple recent illnesses.  Because of right-sided weakness, he also was concerned about possible Todd paralysis and also possible conversion disorder, but according to Dr. Valora Corporal, there was no clear sign of malingering. He recommended to rule out cervical process and get MRI of C-spine.  He recommended to give patient 500 mg IV of Depakote x 1 and continue the magnesium sulfate every 6 hours as well as Reglan every 6 hours and Toradol.  The patient was offered LP at bedside, but she refused adamantly, so it was not possible to definitely rule out subarachnoid hemorrhage. They recommended to restart the patient on aspirin as well as anticoagulants as needed and avoid narcotics. The patient was initiated on her current medications according to Dr. Rodman Key Smith's recommendations and her condition improved. Headache resolved and improved by the day of discharge. The patient was advised to continue  Fioricet every 6 hours as needed and follow up with [facility] for further management of her headaches.  She is to continue aspirin as well as anticoagulation with warfarin. In regard to her right-sided weakness, the patient's right-sided weakness resolved and MRI of her brain did not show any significant abnormalities as well as MRI of her cervical spine.    For nausea, vomiting, the patient was continued on conservative therapy and was started on her usual hemodialysis diet which she tolerated well with no nausea or vomiting.  She was felt to be  stable to be discharged. In regards to end-stage renal disease with clotted AV fistula, left upper extremity fistula, the patient  was seen by Dr. Lucky Cowboy and fistulogram was performed and PTA to left basilic vein was performed and stent placement by Dr. Lucky Cowboy on 06/25/2014.  The patient is to follow up with Dr. Lucky Cowboy in the next few days after discharge and get hemodialysis tomorrow on 06/26/2014 as per schedule.  For her chronic medical problems such as hypertension, coronary artery disease, history  of stroke, end-stage renal disease, hepatitis C, on chronic anticoagulation, the patient is to continue her outpatient management. No changes were made. The patient is being discharged in stable condition with the above-mentioned medications and followup.   TIME SPENT: 40 minutes.    ____________________________ Theodoro Grist, MD rv:LT D: 06/25/2014 19:00:57 ET T: 06/25/2014 20:43:15 ET JOB#: [id number]  cc: Theodoro Grist, MD, <Dictator> Mikeal Hawthorne. Brynda Greathouse, MD Algernon Huxley, MD [facility] Neurology Hemodialysis Oma Alpert MD ELECTRONICALLY SIGNED 07/05/2014 9:45

## 2014-10-14 NOTE — Consult Note (Signed)
PATIENT NAME:  [name], [name] MR#:  [id number] DATE OF BIRTH:  06-07-1961  DATE OF CONSULTATION:  08/05/2014  REFERRING PHYSICIAN:  Leonie Douglas. Doy Hutching, MD CONSULTING PHYSICIAN:  Emigdio Wildeman D. Clayborn Bigness, MD  PRIMARY PHYSICIAN: Mikeal Hawthorne. Brynda Greathouse, MD  NEPHROLOGIST: Murlean Iba, MD   INDICATION: Chest pain.  REASON FOR ADMISSION: Patient admitted for infected shunt.   HISTORY OF PRESENT ILLNESS: A 54 year old African American female well known to me with hypertension; end-stage renal disease on dialysis Tuesday, Thursday, Saturday; coronary artery disease; myocardial infarction status post coronary bypass surgery; status post pericardial effusion with a window and treatment at [facility]; hepatitis C; CVA; cardiomyopathy; (Dictation Anomaly) <<MISSING TEXT>> discharged for which she is being followed by (Dictation Anomaly) <<MISSING TEXT>>. The patient recently had shunt revision January 27 to a graft in her left arm. The patient had persistent trouble with swelling and pain of that area. Since then the pain continued and (Dictation Anomaly) <<MISSING TEXT>> worsening. She is on antibiotics (Dictation Anomaly) <<MISSING TEXT>>. Pain continued. Started having fevers of 102. Brought to the Emergency Room. The patient was found to be febrile with discharge from the site. Hospitalist admitted the patient, placed her on broad-spectrum antibiotics. She is also complaining of chest pain symptoms, sharp in nature, persistent and recurrent. She has had chronic chest pain in the past. She was also slightly hypotensive at the time (Dictation Anomaly) <<MISSING TEXT>>.  PAST MEDICAL HISTORY: Hypertension, end-stage renal disease, coronary artery disease, cardiomyopathy, congestive heart failure, CVA, hepatitis C, history of pericardial effusion.   PAST SURGICAL HISTORY: Coronary bypass surgery, pericardial effusion drainage, (Dictation Anomaly) <<MISSING TEXT>>, (Dictation Anomaly) <<MISSING TEXT>> fistula replacement,  back surgery, catheter placement, (Dictation Anomaly) <<MISSING TEXT>>. (Dictation Anomaly) <<MISSING TEXT>>.  FAMILY HISTORY: (Dictation Anomaly) <<MISSING TEXT>>.   SOCIAL HISTORY: (Dictation Anomaly) <<MISSING TEXT>> ex-smoker. Denies alcohol. (Dictation Anomaly)<<MISSING TEXT>>.   HOME MEDICATIONS: Tylenol and oxycodone 325/(Dictation Anomaly) <<MISSING TEXT>> every 6 hours as needed. Amiodarone 200 a day for atrial fibrillation. Amitriptyline 25 once a day. Aspirin 81 mg a day. Protonix 40 a day. Renvela 800 (Dictation Anomaly) <<MISSING TEXT>>. Sensipar 30 mg once a day. Warfarin 2.5 once a day.  REVIEW OF SYSTEMS: Denies blackout spells, syncope. Denies nausea or vomiting. She has fever, no chills. (Dictation Anomaly) <<MISSING TEXT>>. Denies weight loss, weight gain. (Dictation Anomaly) <<MISSING TEXT>>. Denies (Dictation Anomaly) <<MISSING TEXT>>. Denies (Dictation Anomaly) <<MISSING TEXT>>. She has generalized weakness and fatigue. (Dictation Anomaly) <<MISSING TEXT>> as well as chest pain.   PHYSICAL EXAMINATION:  VITAL SIGNS: T-max shows 102.5, pulse of around 100 and regular, respiratory rate of 20, blood pressure initially was 130/80 but today was 80/50, 95% on room air. HEENT: Normocephalic, atraumatic. Pupils equal, reactive to light. NECK: Supple. (Dictation Anomaly) <<MISSING TEXT>> LUNGS: (Dictation Anomaly) <<MISSING TEXT>>.  HEART: Regular rate, rhythm. Slightly tachycardic (Dictation Anomaly) <<MISSING TEXT>>. Positive S3, soft S4. PMI (Dictation Anomaly) <<MISSING TEXT>>. ABDOMEN: Benign (Dictation Anomaly) <<MISSING TEXT>>. EXTREMITIES: Left arm (Dictation Anomaly) <<MISSING TEXT>>.  NEUROLOGIC: (Dictation Anomaly) <<MISSING TEXT>>.  SKIN: (Dictation Anomaly) <<MISSING TEXT>>.   LABORATORY DATA: Glucose (Dictation Anomaly) <<MISSING TEXT>>, BUN (Dictation Anomaly) <<MISSING TEXT>>, creatinine 4.5, sodium 134, potassium 4.2, chloride of 97, bicarbonate of 27. (Dictation  Anomaly) <<MISSING TEXT>>, calcium (Dictation Anomaly) <<MISSING TEXT>>, total protein (Dictation Anomaly) <<MISSING TEXT>>, (Dictation Anomaly) <<MISSING TEXT>>. AST 115, ALT (Dictation Anomaly) <<MISSING TEXT>>, white count 5.8, hemoglobin 12, hematocrit (Dictation Anomaly) <<MISSING TEXT>>, platelet count of (Dictation Anomaly) <<MISSING TEXT>>. Lactic acid (Dictation Anomaly) <<MISSING TEXT>>.  Chest x-ray: (Dictation Anomaly)<<MISSING TEXT>> cardiomegaly. Mild (Dictation Anomaly) <<MISSING TEXT>>.   EKG: Sinus tachycardia, nonspecific ST-T wave changes.  ASSESSMENT: (Dictation Anomaly) <<MISSING TEXT>> atrial fibrillation (Dictation Anomaly) <<MISSING TEXT>> hepatitis C, history of hypertension, multiple shunt revisions.  PLAN: Agree with (Dictation Anomaly) <<MISSING TEXT>>. Follow up blood cultures. Continue broad-spectrum antibiotics. Consider (Dictation Anomaly) <<MISSING TEXT>>. Recommend vascular input for replacement of the shunt.(Dictation Anomaly) <<MISSING TEXT>> Continue (Dictation Anomaly) <<MISSING TEXT>> dialysis therapy (Dictation Anomaly) <<MISSING TEXT>> complaining of chest pain symptoms, many atypical features. Would recommend medical therapy for now. Do not recommend cardiac catheterization. Do not recommend blood flow study. Would continue pain management (Dictation Anomaly)  <<MISSING TEXT>> chest pain. History of CVA, continue Coumadin therapy. History of hepatitis C. Follow up LFTs. (Dictation Anomaly) <<MISSING TEXT>> Continue conservative cardiology (Dictation Anomaly) <<MISSING TEXT>> and Coumadin (Dictation Anomaly) <<MISSING TEXT>>. (Dictation Anomaly)<<MISSING TEXT>>.    ____________________________ Loran Senters. Clayborn Bigness, MD ddc:ST D: 08/05/2014 14:57:46 ET T: 08/05/2014 15:45:36 ET JOB#: [id number]  cc: Arlette Schaad D. Adeeb Konecny, MD, <Dictator>

## 2014-10-14 NOTE — Consult Note (Signed)
Chief Complaint:  Subjective/Chief Complaint states chest pain is much better patient still has some shortness of breath and dyspnea denies any fever   VITAL SIGNS/ANCILLARY NOTES: **Vital Signs.:   23-Feb-16 07:54  Vital Signs Type Routine  Temperature Temperature (F) 97.8  Celsius 36.5  Temperature Source oral  Pulse Pulse 84  Respirations Respirations 18  Systolic BP Systolic BP 123456  Diastolic BP (mmHg) Diastolic BP (mmHg) 88  Mean BP 98  Pulse Ox % Pulse Ox % 90  Pulse Ox Activity Level  At rest  Oxygen Delivery 4L  *Intake and Output.:   Shift 23-Feb-16 07:00  Grand Totals Intake:  50 Output:      Net:  50 24 Hr.:  50  IV (Secondary)      In:  50  Urine ml     Out:  0  Length of Stay Totals Intake:  2440 Output:      Net:  2440   Brief Assessment:  GEN well developed, well nourished, no acute distress   Cardiac Regular  murmur present  -- LE edema  --Gallop   Respiratory normal resp effort  rhonchi   Gastrointestinal Normal   Gastrointestinal details normal Soft  Nontender  Nondistended   EXTR negative cyanosis/clubbing, negative edema   Lab Results: Routine Coag:  23-Feb-16 05:50   Prothrombin  32.1 (11.4-15.0 NOTE: New Reference Range  07/13/14)  INR 3.1 (INR reference interval applies to patients on anticoagulant therapy. A single INR therapeutic range for coumarins is not optimal for all indications; however, the suggested range for most indications is 2.0 - 3.0. Exceptions to the INR Reference Range may include: Prosthetic heart valves, acute myocardial infarction, prevention of myocardial infarction, and combinations of aspirin and anticoagulant. The need for a higher or lower target INR must be assessed individually. Reference: The Pharmacology and Management of the Vitamin K  antagonists: the seventh ACCP Conference on Antithrombotic and Thrombolytic Therapy. [id number] Sept:126 (3suppl): [id number]. A HCT value >55% may artifactually  increase the PT.  In one study,  the increase was an average of 25%. Reference:  "Effect on Routine and Special Coagulation Testing Values of Citrate Anticoagulant Adjustment in Patients with High HCT Values." American Journal of Clinical Pathology 2006;126:400-405.)  Routine Hem:  23-Feb-16 05:50   WBC (CBC) 6.6  RBC (CBC)  3.59  Hemoglobin (CBC)  10.7  Hematocrit (CBC)  32.4  Platelet Count (CBC) 154  MCV 90  MCH 29.8  MCHC 33.0  RDW  19.1  Neutrophil % 58.1  Lymphocyte % 21.2  Monocyte % 17.2  Eosinophil % 3.0  Basophil % 0.5  Neutrophil # 3.8  Lymphocyte # 1.4  Monocyte #  1.1  Eosinophil # 0.2  Basophil # 0.0 (Result(s) reported on 07 Aug 2014 at 07:15AM.)   Radiology Results: XRay:    18-Feb-16 23:06, Chest Portable Single View  Chest Portable Single View   REASON FOR EXAM:    fever, shob  COMMENTS:       PROCEDURE: DXR - DXR PORTABLE CHEST SINGLE VIEW  - Aug 02 2014 11:06PM     CLINICAL DATA:  Headache, fever, vomiting and shortness of breath  beginning tonight.    EXAM:  PORTABLE CHEST - 1 VIEW    COMPARISON:  PA and lateral chest 07/07/2014 and 06/23/2014.    FINDINGS:  Dialysis catheter is again seen. Marked enlargement of the  cardiopericardial silhouette is identified and there is pulmonary  vascular congestion. There is increased density in the left  lung  base. The lungs are otherwise unremarkable. No pneumothorax  identified.     IMPRESSION:  Increased left basilar density could be due to atelectasis or  pneumonia.    Cardiomegaly and pulmonary vascular congestion.      Electronically Signed    By: Inge Rise M.D.    On: 08/02/2014 23:16     Verified By: Ramond Dial, M.D.,  Cardiology:    18-Feb-16 22:43, ECG  Ventricular Rate 99  Atrial Rate 99  P-R Interval 222  QRS Duration 116  QT 382  QTc 490  P Axis 78  R Axis -15  T Axis 82  ECG interpretation   Sinus rhythm with 1st degree A-V block with occasional  Premature ventricular complexes  Left ventricular hypertrophy with QRS widening  T wave abnormality, consider lateral ischemia  Prolonged QT  Abnormal ECG  When compared with ECG of 02-Aug-2014 22:42,  Premature ventricular complexes are now Present  PR interval has increased  Right bundle branch block is no longer Present  ----------unconfirmed----------  Confirmed by OVERREAD, NOT ([id number]), editor PEARSON, BARBARA ([id number]) on 08/03/2014 10:13:06 AM  ECG     20-Feb-16 15:04, ECG  Ventricular Rate 100  Atrial Rate 100  P-R Interval 194  QRS Duration 126  QT 400  QTc 516  P Axis 74  R Axis -14  T Axis 76  ECG interpretation   Sinus rhythm with occasional , and consecutive Premature ventricular complexes and Fusion complexes  Possible Left atrial enlargement  Right bundle branch block  Left ventricular hypertrophy  Inferior infarct , age undetermined  Abnormal ECG  When compared with ECG of 02-Aug-2014 22:43,  Fusion complexes are now Present  Premature ventricular complexes are now Present  Right bundle branch block is now Present  Confirmed by Kathlyn Sacramento ([id number]) on 08/06/2014 9:49:39 AM    Overreader: Kathlyn Sacramento  ECG    Assessment/Plan:  Assessment/Plan:  Assessment dialysis shunt infection  recurrent chest pain  cardiomyopathy  coronary artery disease  end-stage renal disease  history of hypertension  currently mild hypotension  atrial fibrillation GERD .   Plan continue broad-spectrum antibiotics  agree with vascular input for possible shunt revision  continue anticoagulation for AFib  agree with dialysis therapy 4 end-stage renal disease  continue therapy for congestive heart failure  cardiomyopathy appears to be stable continue current therapy  continue rate control for atrial fibrillation  agree with therapy for reflux symptoms  medical therapy for now   Electronic Signatures: Yolonda Kida (MD)  (Signed 23-Feb-16 12:06)  Authored: Chief  Complaint, VITAL SIGNS/ANCILLARY NOTES, Brief Assessment, Lab Results, Radiology Results, Assessment/Plan   Last Updated: 23-Feb-16 12:06 by Lujean Amel D (MD)

## 2014-10-14 NOTE — Op Note (Signed)
PATIENT NAME:  [name], [name] MR#:  [id number] DATE OF BIRTH:  [birth year]  DATE OF PROCEDURE:  06/25/2014  PREOPERATIVE DIAGNOSES: 1.  End-stage renal disease.  2.  Prolonged bleeding with left AV fistula on dialysis.  3.  Hypertension.  4.  History of stroke.  5.  History of congestive heart failure.   POSTOPERATIVE DIAGNOSES:  1.  End-stage renal disease.  2.  Prolonged bleeding with left AV fistula on dialysis.  3.  Hypertension.  4.  History of stroke.  5.  History of congestive heart failure.   PROCEDURES: 1.  Ultrasound guidance for vascular access to left brachiobasilic AV fistula.  2.  Left upper extremity fistulogram and central venogram.  3.  Percutaneous transluminal angioplasty of basilic vein stent with 8 mm diameter angioplasty balloon.   SURGEON: Algernon Huxley, M.D.   ANESTHESIA: Local with moderate conscious sedation.   ESTIMATED BLOOD LOSS: Minimal.   INDICATION FOR PROCEDURE: A 54 year old female with end-stage renal disease. She was scheduled for an outpatient fistulogram. Due to prolonged bleeding, she was actually readmitted to the hospital yesterday with a fistulogram still to be performed for evaluation of this. Risks and benefits were discussed. Informed consent was obtained.   DESCRIPTION OF PROCEDURE: The patient was brought to the vascular suite. The left upper extremity was sterilely prepped and draped and a sterile surgical field was created. The fistula was accessed near the arterial anastomosis under direct ultrasound guidance with micropuncture needle. Micropuncture wire and sheath were then placed, and I upsized to a 6-French sheath. The patient was given 3000 units of intravenous heparin. Imaging was performed showing about a 65 to 70% stenosis, likely from intimal hyperplasia, at the leading edge of the stent, in the basilic vein, as it transitioned into the axillary vein. There were actually 2 stents in place and this was the initial stent near the  aneurysmal portion of the fistula. The remainder of the central venous outflow was patent. I crossed the lesion without difficulty with a Magic torque wire, treated the lesion with an 8 mm diameter x 4 cm in length high-pressure angioplasty balloon inflated to about 26 atmospheres. There was a waste seen with inflation, which resolved with angioplasty, and completion angiogram showed markedly improved flow with only about a 20% residual stenosis. While the balloon was inflated, imaging was performed to evaluate the arterial anastomosis, which was widely patent. I then removed the sheath, 4-0 Monocryl pursestring suture was placed, pressure was held, and a sterile dressings were placed. The patient tolerated the procedure well and was taken to the recovery room in stable condition.   ____________________________ Algernon Huxley, MD jsd:sb D: 06/25/2014 10:18:43 ET T: 06/25/2014 10:35:02 ET JOB#: [id number]  cc: Algernon Huxley, MD, <Dictator> Algernon Huxley MD ELECTRONICALLY SIGNED 06/26/2014 13:39

## 2014-10-14 NOTE — H&P (Signed)
PATIENT NAME:  [name], [name] MR#:  [id number] DATE OF BIRTH:  [birth year]  DATE OF ADMISSION:  07/08/2014  PRIMARY CARE PHYSICIAN: [name], MD  REFERRING PHYSICIAN: Loney Hering, MD  CHIEF COMPLAINT: Shortness of breath, nausea.   HISTORY OF PRESENT ILLNESS: [name] is a 54 year old female with a history of end-stage renal disease on hemodialysis; missed her hemodialysis as the dialysis center was closed. Comes to the Emergency Department with complaints of nausea, vomiting. Also complains of shortness of breath. Has some cough with productive sputum of clear sputum. Did not have any fever. Workup in the Emergency Department: The patient is found to have potassium of 7. It is unsure if the patient is compliant with her low-potassium diet. Chest x-ray in the Emergency Department showed vascular congestion, cardiomegaly, slight increased interstitial markings. The patient received in the Emergency Department temporizing measures for the hyperkalemia with insulin and calcium chloride.   PAST MEDICAL HISTORY:  1.  End-stage renal disease, on hemodialysis Tuesday, Thursday, and Saturday.  2.  Coronary artery disease, status post coronary artery bypass grafting in September 2015.  3.  Hypertension.  4.  Hepatitis C.  5.  History of CVA.  6.  Coumadin.   PAST SURGICAL HISTORY:  1.  Coronary artery bypass grafting.  2.  AV fistula placement. 3.  Neck surgery. 4.  Dialysis catheter placed.   ALLERGIES:  1.  MORPHINE.  2.  ADHESIVE TAPE.  3.  LATEX.   HOME MEDICATIONS:  1.  Coumadin 2.5 mg once a day.  2.  Renvela 800 mg 2 tablets 3 times a day.  3.  Protonix 40 mg once a day.  4.  Aspirin 81 mg daily.  5.  Amitriptyline 25 mg once a day.  6.  Amiodarone 200 mg daily.  7.  Percocet 5/325 mg every 6 hours as needed.   SOCIAL HISTORY: Quit smoking in August 2015. Prior to that smoked heavily. Denies drinking alcohol or using illicit drugs.   FAMILY HISTORY: Prostate  cancer and hypertension runs in the family.   REVIEW OF SYSTEMS:  CONSTITUTIONAL: Experiencing generalized weakness.  EYES: No change in vision.  EARS, NOSE, THROAT: No change in hearing. RESPIRATORY: Has shortness of breath.  CARDIOVASCULAR: No chest pain or palpitations.  GASTROINTESTINAL: Has nausea, vomiting, abdominal pain.  GENITOURINARY: No dysuria or hematuria.  HEMATOLOGIC: No easy bruising or bleeding.  SKIN: No rash or lesions.  MUSCULOSKELETAL: No joint pains and aches.  NEUROLOGIC: No weakness or numbness in any part of the body.   PHYSICAL EXAMINATION:  GENERAL: This is a well-built, well-nourished, age-appropriate female lying down in the bed, not in distress.  VITAL SIGNS: Temperature 98.2, pulse 94, blood pressure 127/84, respiratory rate of 18, oxygen saturation is 93% on room air.  HEENT: Head: Normocephalic, atraumatic. There is no scleral icterus. Conjunctivae normal. Pupils equal and react to light. Mucous membranes moist. No pharyngeal erythema.  NECK: Supple. No lymphadenopathy. No JVD. No carotid bruit. No thyromegaly.  CHEST: Has no focal tenderness. Bilateral diffuse wheezing.  HEART: S1, S2 regular. No murmurs are heard.  ABDOMEN: Bowel sounds are present. Soft, nontender, nondistended. No hepatosplenomegaly.  EXTREMITIES: No pedal edema. Pulses 2+.  NEUROLOGIC: The patient is alert, oriented to place, person, and time. Cranial nerves II through XII intact. Motor 5/5 in upper and lower extremities.   LABORATORY DATA: Chest x-ray PA and lateral: Vascular congestion and cardiomegaly with slightly increased interstitial markings. Complete metabolic panel: BUN 56, creatinine of  7.74, potassium 7. The rest of all the values are within normal limits. CBC: WBC 5.1, hemoglobin 10.8, platelet count of 167,000.   ASSESSMENT AND PLAN: [name] is a 54 year old female who comes to the Emergency Department with nausea, vomiting, and hyperkalemia.  1.  Hyperkalemia  secondary to missed hemodialysis: The patient was given temporizing measures with improvement of the potassium to 4.3. Also given 1 dose of Kayexalate. Consulted  nephrology.  2.  Shortness of breath secondary to fluid overload as well as bronchitis: Keep the patient on breathing treatments, prednisone, Levophed, and also nephrology consult for hemodialysis.  3.  Hypertension: Currently well controlled.  4.  History of coronary artery disease, status post coronary artery grafting: Continue with aspirin.   TIME SPENT: 50 minutes.    ____________________________ Monica Becton, MD pv:ts D: 07/08/2014 03:22:13 ET T: 07/08/2014 03:51:45 ET JOB#: [id number]  cc: Monica Becton, MD, <Dictator> Mikeal Hawthorne. Brynda Greathouse, MD Monica Becton MD ELECTRONICALLY SIGNED 07/10/2014 4:32

## 2014-10-14 NOTE — Consult Note (Signed)
Referring Physician:  Azucena Freed :   Primary Care Physician:  Azucena Freed : [facility], [address] [place], [place] [address], [place] [phone]  Reason for Consult: Admit Date: 24-Jun-2014  Chief Complaint: headache  Reason for Consult: headache   History of Present Illness: History of Present Illness:   54 yo RHD F presents to [facility] secondary to sudden onset of headache while getting dialysis.  Pt states that she does not normal get headaches and this headache is 10/10.  She has some N/V as well as photophobia.  Rest makes her headache a little better while movement and lights make it worse.  She was able to sleep last night with it.  She states that headache starts in the back of the neck and goes forward behind her eyes.  She also reports mild baseline R sided weakness which is worse after yesterday.  She tells me that she has been really sick lately with the heart and stroke and that she is tired of getting sick overall.    ROS:  General fatigue   HEENT no complaints   Lungs no complaints   Cardiac no complaints   GI nausea/vomiting   GU no complaints   Musculoskeletal neck pain   Extremities no complaints   Skin no complaints   Neuro headache  R sided weakness   Endocrine no complaints   Psych depression   Past Medical/Surgical Hx:  Pericardial Effusion:   CABG:   Hepatitis C:   fistula for dialysis left upper arm:   Multi-drug Resistant Organism (MDRO): Positive culture for MRSA.  dialysis: Tues-Thur-Sat  CVA:   Renal Failure:   HTN:   stent-cardiac:   MI:   Neck surgery:   Dialysis catheter right chest:   Past Medical/ Surgical Hx:  Past Medical History personally reviewed by me as above   Past Surgical History personally reviewed by me as above   Home Medications: Medication Instructions Last Modified Date/Time  aspirin 81 mg oral delayed release tablet 1 tab(s) orally once a day (at bedtime) 10-Jan-16 10:23   amiodarone 200 mg oral tablet 1 tab(s) orally once a day 10-Jan-16 10:23  warfarin 2.5 mg oral tablet  orally once a day 10-Jan-16 10:23  atorvastatin 40 mg oral tablet 1 tab(s) orally once a day (at bedtime) 10-Jan-16 10:23  Roxicodone 15 mg oral tablet 0.5 tab(s) orally every 6 hours, As Needed - for Pain 10-Jan-16 10:23  midodrine 5 mg oral tablet 1 tab(s) orally 3 times a day 10-Jan-16 10:23  Dok Plus 1 tab(s) orally once a day (at bedtime), As Needed - for Constipation 10-Jan-16 10:23  magnesium oxide 400 mg oral tablet 1 tab(s) orally once a day 10-Jan-16 10:23  hydrOXYzine hydrochloride 50 mg oral tablet 1 tab(s) orally 2 times a day PRN 10-Jan-16 10:23  Sensipar 30 mg oral tablet 1 tab(s) orally once a day (in the evening) with the largest meal of the day 10-Jan-16 10:23  pantoprazole 40 mg oral delayed release tablet 1 tab(s) orally 2 times a day 10-Jan-16 10:23   Allergies:  Morphine: Rash  Adhesive: Blisters  Latex: Blisters, Hives  Allergies:  Allergies morphine   Social/Family History: Employment Status: disabled  Lives With: alone  Living Arrangements: apartment  Social History: no tob, no EtOH, no illicits  Family History: + strokes, no seizures   Vital Signs: **Vital Signs.:   10-Jan-16 11:08  Vital Signs Type Admission  Temperature Temperature (F) 98.4  Celsius 36.8  Temperature Source oral  Pulse Pulse 79  Respirations Respirations 17  Systolic BP Systolic BP 97  Diastolic BP (mmHg) Diastolic BP (mmHg) 54  Mean BP 68  Pulse Ox % Pulse Ox % 94  Pulse Ox Activity Level  At rest  Oxygen Delivery Room Air/ 21 %   Physical Exam: General: slightly overweight, mild distress, appears older than stated age  HEENT: PERRLA, EOMI, nl VF, face symmetric, tongue midline, shoulder shrug equal  Neck: mild tenderness to palpation, not supple,due to pain,?[place], neg brudinski, no bruit  Chest: CTA B, no wheezing, good movement  Cardiac: RRR, no murmurs, no edema,  2+ pulses  Extremities: no C/C/E, FROM   Neurologic Exam: Mental Status: alert and oriented x 3, normal speech and language, follows complex commands  Cranial Nerves: PERRLa on brief inspection but pt tries to close her eyes, EOMI but limited exam because pt wont let me really open her eyes, nl VF, face symmetric, tongue midline, shoulder shrug equal  Motor Exam: 3/5 R but give away, 5/ 5 on L, nl tone, no tremor  Deep Tendon Reflexes: 1+/4 B, plantars mute B  Sensory Exam: decreased temp on R arm and leg only, nl vibration  Coordination: untestable on R, nl on L; but pt was able to walk into the hospital   Lab Results: LabObservation:  10-Jan-16 11:21   OBSERVATION Reason for Test  Routine Chem:  09-Jan-16 23:18   Result Comment TROPONIN - RESULTS VERIFIED BY REPEAT TESTING.  - C/BETH BRANDEL AT 0012 06/24/14.PMH  - READ-BACK PROCESS PERFORMED.  Result(s) reported on 24 Jun 2014 at 12:16AM.  Glucose, Serum  118  BUN 14  Creatinine (comp)  3.59  Sodium, Serum 136  Potassium, Serum  3.3  Chloride, Serum 98  CO2, Serum 28  Calcium (Total), Serum 8.5  Anion Gap 10  Osmolality (calc) 274  eGFR (African American)  17  eGFR (Non-African American)  14 (eGFR values <68m/min/1.73 m2 may be an indication of chronic kidney disease (CKD). Calculated eGFR, using the MRDR Study equation, is useful in  patients with stable renal function. The eGFR calculation will not be reliable in acutely ill patients when serum creatinine is changing rapidly. It is not useful in patients on dialysis. The eGFR calculation may not be applicable to patients at the low and high extremes of body sizes, pregnant women, and vegetarians.)  Cardiac:  09-Jan-16 23:18   CK, Total 42  CPK-MB, Serum 1.2 (Result(s) reported on 24 Jun 2014 at 12:08AM.)  Troponin I  0.06 (0.00-0.05 0.05 ng/mL or less: NEGATIVE  Repeat testing in 3-6 hrs  if clinically indicated. >0.05 ng/mL: POTENTIAL  MYOCARDIAL INJURY.  Repeat  testing in 3-6 hrs if  clinically indicated. NOTE: An increase or decrease  of 30% or more on serial  testing suggests a  clinically important change)  Routine Coag:  09-Jan-16 23:18   Prothrombin  17.4  INR 1.5 (INR reference interval applies to patients on anticoagulant therapy. A single INR therapeutic range for coumarins is not optimal for all indications; however, the suggested range for most indications is 2.0 - 3.0. Exceptions to the INR Reference Range may include: Prosthetic heart valves, acute myocardial infarction, prevention of myocardial infarction, and combinations of aspirin and anticoagulant. The need for a higher or lower target INR must be assessed individually. Reference: The Pharmacology and Management of the Vitamin K  antagonists: the seventh ACCP Conference on Antithrombotic and Thrombolytic Therapy. CSEGBT.5176Sept:126 (3suppl): [id number] A HCT value >55% may artifactually increase the  PT.  In one study,  the increase was an average of 25%. Reference:  "Effect on Routine and Special Coagulation Testing Values of Citrate Anticoagulant Adjustment in Patients with High HCT Values." American Journal of Clinical Pathology 2006;126:400-405.)  Routine Hem:  09-Jan-16 23:18   WBC (CBC) 4.9  RBC (CBC) 4.25  Hemoglobin (CBC)  11.9  Hematocrit (CBC) 39.1  Platelet Count (CBC) 174 (Result(s) reported on 23 Jun 2014 at 11:47PM.)  MCV 92  MCH 28.0  MCHC  30.4  RDW  19.9  10-Jan-16 08:59   Erythrocyte Sed Rate  45 (Result(s) reported on 24 Jun 2014 at 09:44AM.)   Radiology Results: [place]:    10-Jan-16 05:11, US Carotid Doppler Bilateral  US Carotid Doppler Bilateral   REASON FOR EXAM:    cva  COMMENTS:       PROCEDURE: [place]  - US CAROTID DOPPLER BILATERAL  - Jun 24 2014  5:11AM     CLINICAL DATA:  History of hypertension, stroke, CAD (post prior  myocardial infarction and CABG).    EXAM:  BILATERAL CAROTID DUPLEX ULTRASOUND    TECHNIQUE:  Pearline Cables  scale imaging, color Doppler and duplex ultrasound were  performed of bilateral carotid and vertebral arteries in the neck.  COMPARISON:  Brain MRI /MRA - 06/24/2014    FINDINGS:  Criteria: Quantification of carotid stenosis is based on velocity  parameters that correlate the residual internal carotid diameter  with NASCET-based stenosis levels, using the diameter of the distal  internal carotid lumen as the denominator for stenosis measurement.    The following velocity measurements were obtained:    RIGHT    ICA:  61/26 cm/sec    CCA:  52/77 cm/sec  SYSTOLIC ICA/CCA RATIO:  0.6    DIASTOLIC ICA/CCA RATIO:  1.6    ECA:  57 cm/sec    LEFT    ICA:  78/32 cm/sec    CCA:  82/42 cm/sec    SYSTOLIC ICA/CCA RATIO:  0.9    DIASTOLIC ICA/CCA RATIO:  1.7  ECA:  57 cm/sec    RIGHT CAROTID ARTERY: There is mild tortuosity of the right common  carotid artery. There is a minimal amount of eccentric mixed  echogenic plaque within the right carotid bulb (image 14 and 18),  extending to involve the origin and proximal aspects of the right  internal carotid artery (image 24), not resulting in elevated peak  systolic velocities within the interrogated course the right  internal carotid artery to suggest a hemodynamically significant  stenosis.    RIGHT VERTEBRAL ARTERY:  Antegrade flow    LEFT CAROTID ARTERY: There is a minimal amount of eccentric mixed  echogenic plaque within the left carotid bulb (images 49), extending  to involve the origin and proximal aspects of the left internal  carotid artery (images 59 and 60), not resulting in elevated peak  systolic velocities within the interrogated course of the left  internal carotid artery to suggest a hemodynamically significant  stenosis.    LEFT VERTEBRAL ARTERY:  Antegrade flow     IMPRESSION:  Minimal amount of bilateral atherosclerotic plaque, not resulting in  a hemodynamically significant stenosis.      Electronically  Signed    By: Sandi Mariscal M.D.    On: 06/24/2014 07:09         Verified By: Aileen Fass, M.D.,  MRI:    10-Jan-16 03:38, MRI Brain Without Contrast  MRI Brain Without Contrast   REASON FOR EXAM:  right arm and leg weakness  COMMENTS:       PROCEDURE: MR  - MR BRAIN WO CONTRAST  - Jun 24 2014  3:38AM     CLINICAL DATA:  Headache, weakness on RIGHT side. Nausea for 1 day.  GI issues. End-stage renal disease.    EXAM:  MRI HEAD WITHOUT CONTRAST    MRA HEAD WITHOUT CONTRAST    MRV HEAD WITHOUT CONTRAST  TECHNIQUE:  Multiplanar, multiecho pulse sequences of the brain and surrounding  structures were obtained without and with intravenous contrast.  Angiographic images of thehead were obtained using MRA and MRV  technique without contrast. MIP images provided.    COMPARISON:  CT of the head June 23, 2014 at 2335 hours and MRI of  the head October 10, 2013    FINDINGS:  MRI HEAD FINDINGS    Mild motion degraded examination. No reduced diffusion to suggest  acute ischemia. Minimal susceptibility in the RIGHT frontal lobe  corresponding to known remote infarcts may reflect mineralization.  Multifocal RIGHToccipital lobe encephalomalacia is unchanged. RIGHT  greater LEFT bifrontal encephalomalacia is unchanged. LEFT mesial  parietal lobe encephalomalacia is unchanged. Additional patchy  supratentorial and pontine white matter FLAIR T2 hyperintensities.  Small remote RIGHT greater than LEFT inferior cerebellar infarcts.    No abnormal extra-axial fluid collections. Ocular globes and orbital  contents are unremarkable though not tailored for evaluation. No  abnormal sellar expansion. No cerebellar tonsillar ectopia. RIGHT  maxillary sinus and LEFT sphenoid mucosalthickening without  paranasal sinus air-fluid levels.    MRA HEAD FINDINGS    Motion degraded examination.  Anterior circulation: Normal flow related enhancement of the  included cervical, petrous, cavernous and  supra clinoid internal  carotid arteries. Patent anterior communicating artery. Normal flow  related enhancement of the anterior and middle cerebral arteries,  including more distal segments.    No large vessel occlusion, hemodynamically significant stenosis.  Limited assessment for aneurysm due to motion. Mild luminal  irregularity in the mid to distal vessels.    Posterior circulation: Codominant vertebral arteries. Basilar artery  is patent, with normal flow related enhancement of the main branch  vessels. Normal flow related enhancement of the posterior cerebral  arteries.  No large vessel occlusion, hemodynamically significant stenosis.  Mild luminal irregularity of the mid to distal vessels.    MRV HEAD FINDINGS    Flow related enhancement is superior sagittal sinus, torculafor  roughly, transverse sinuses, sigmoid sinuses, and internal jugular  veins. Flow early enhancement internal cerebral veins. Motion  degrades overall evaluation.     IMPRESSION:  MRI HEAD: Motion degraded examination, no acute intracranial  process.    Multifocal encephalomalacia suggest remote infarcts, spanning  multiple vascular territories. Small cerebellar infarcts. Moderate  white matter changes suggest chronic small vessel ischemic disease,  advanced for age.    MRA HEAD: Motion degraded examination, no large vessel occlusion.  Mild luminal regularity of the mid to distal vessels may reflect  atherosclerosis or motion artifact.    MRV HEAD: Motion degraded examination, no dural venous sinus  thrombosis.      Electronically Signed  By: Elon Alas    On: 06/24/2014 04:12     Verified By: Ricky Ala, M.D.,  CT:    09-Jan-16 23:45, CT Head Without Contrast  CT Head Without Contrast   REASON FOR EXAM:    CVA  COMMENTS:   May transport without cardiac monitor    PROCEDURE: CT  - CT HEAD WITHOUT CONTRAST  -  Jun 23 2014 11:45PM     CLINICAL DATA:  Headache, vomiting,  generalized pain. Dialysis today  and patient feels underweight. Patient was here last night for  headache.    EXAM:  CT HEAD WITHOUT CONTRAST    TECHNIQUE:  Contiguous axial images were obtained from the base of the skull  through the vertex without intravenous contrast.  COMPARISON:  MRI brain 10/10/2013.  CT head 10/08/2013.    FINDINGS:  Mild diffuse cerebral atrophy. Diffuse low-attenuation change  throughout the deep white matter consistent with small vessel  ischemia. Possible infarct in the right frontal and parietal and  left frontal regions. No mass effect or midline shift. No abnormal  extra-axial fluid collections. Gray-white matter junctions are  distinct. Basal cisterns are not effaced. No evidence of acute  intracranial hemorrhage. No depressed skull fractures. Mucosal  thickening in the sphenoid sinuses. Mastoid air cells are not  opacified. Vascular calcifications.     IMPRESSION:  No acute intracranial abnormalities. Chronic atrophy and small  vessel ischemic changes. Old bilateral infarcts. No significant  change since previous study.      Electronically Signed    By: Lucienne Capers M.D.    On: 06/24/2014 00:00         Verified By: Neale Burly, M.D.,   Radiology Impression: Radiology Impression: MRI of brain personally reviewed by me and shows moderate to severe old white matter changes R>L  MRA and MRV are negative   Impression/Recommendations: Recommendations:   prior notes reviewed by me reviewed by me    Headache-  unclear of etiology, can not completely r/o SAH but highly doubt this.  This could be post-ictal but pt denies anything that sounds like seizure.  Likely tension headache from stress as pt has a lot of stress from multiple recent illnesses.  Doubt call fleming as well with normal MRA. R sided weakness-  could be Todds or worsening of old stroke symptoms for other reasons, this could be conversion as well but no clear sign of  malingering;  r/o cervical process MRI of c-spine depakote 582m IV x 1 pt refused LP at bedside so can not definitely r/o SAH mag sulfate 509mq6h, reglan 49m59m6h and toradol 149m24mh avoid narcotics ok to restart ASA and anticoagulation as necessary will follow  Electronic Signatures: SmitJamison Neighbor)  (Signed 10-Jan-16 12:06)  Authored: REFERRING PHYSICIAN, Primary Care Physician, Consult, History of Present Illness, Review of Systems, PAST MEDICAL/SURGICAL HISTORY, HOME MEDICATIONS, ALLERGIES, Social/Family History, NURSING VITAL SIGNS, Physical Exam-, LAB RESULTS, RADIOLOGY RESULTS, Recommendations   Last Updated: 10-Jan-16 12:06 by SmitJamison Neighbor)

## 2014-10-14 NOTE — Op Note (Signed)
PATIENT NAME:  [name], [name] MR#:  [id number] DATE OF BIRTH:  January 17, 1961  DATE OF PROCEDURE:  09/03/2014  PREOPERATIVE DIAGNOSES:  1.  End-stage renal disease.  2.  Functional left arm arteriovenous fistula with recent surgical revision, now working well.  3.  Hypertension.  4.  History of stroke.   POSTOPERATIVE DIAGNOSES: 1.  End-stage renal disease.  2.  Functional left arm arteriovenous fistula with recent surgical revision, now working well.  3.  Hypertension.  4.  History of stroke.   PROCEDURE: Removal of right jugular PermCath.  SURGEON:  Leotis Pain, M.D.   ANESTHESIA:  Local with moderate conscious sedation.  INDICATION FOR PROCEDURE: This is a 54 year old female with end-stage renal disease. She had a recent surgical revision due to an aneurysmal left arm arteriovenous fistula. The surgical revision was with an Artegraft jump graft and this is now working well and we can remove her PermCath.   DESCRIPTION OF PROCEDURE:  The patient's right neck, chest, and existing catheter were sterilely prepped and draped.  The area around the catheter was anesthetized copiously with 1% Lidocaine. The catheter was dissected out with curved hemostats until the cuff was freed from the surrounding fibrous sheath.  The fibrous sheath was transected and the catheter was then removed in its entirety using gentle traction.  Pressure was held and sterile dressings placed.    The patient tolerated the procedure well and was taken to the recovery room in stable condition.   ____________________________ Algernon Huxley, MD jsd:sp D: 09/03/2014 11:53:30 ET T: 09/03/2014 12:45:18 ET JOB#: [id number]  cc: Algernon Huxley, MD, <Dictator> Algernon Huxley MD ELECTRONICALLY SIGNED 09/03/2014 15:04

## 2014-10-14 NOTE — H&P (Signed)
PATIENT NAME:  [name], RAKOCZY MR#:  [id number] DATE OF BIRTH:  09-02-1960  DATE OF ADMISSION:  08/03/2014  REFERRING PHYSICIAN: Verdia Kuba. Paduchowski, MD   PRIMARY CARE PRACTITIONER: Mikeal Hawthorne. Brynda Greathouse, MD  PRIMARY NEPHROLOGIST: Murlean Iba, MD   ADMITTING PHYSICIAN: Juluis Mire, MD    CHIEF COMPLAINT:  1.  Fever.  2.  Discharging wound, left arm, at the site of AV fistula.   HISTORY OF PRESENT ILLNESS: A 54 year old African American female with a history of hypertension; end-stage renal disease on hemodialysis Tuesday, Thursday, Saturday; coronary artery disease/MI, status post CABG; hepatitis C; and CVA on Coumadin, presents to the Emergency Room with the complaints of the left arm pain with associated discharging wound on the left arm following vascular surgery and graft placement on January 27. The patient stated that she had graft placed on the left arm by vascular surgeon on 07/11/2014, following which since last Sunday she has been noticing pain and swelling on the left arm and with a discharging wound for which she has seen the vascular surgeon today in the clinic and was prescribed some antibiotics and pain medications. Since the pain continued with further worsening also the discharging wound of the left arm, she decided to come to the Emergency Room. EMS was called and EMS found that the patient is a temperature of 102 degrees Fahrenheit and brought the patient to the Emergency Room. In the Emergency Room, the patient was evaluated by the ED physician and was found to be febrile. Further workup revealed white blood cell count, which is normal, but she does have some discharging wound the left arm. Hence, the hospitalist service was consulted for further evaluation and management. The patient did complain of some mild shortness of breath, but denies any chest pain. She did have some nausea and one episode of vomiting, but no abdominal pain. Denies any dysuria. No history of any recent  cough. The patient was started on IV antibiotics after blood cultures; she is currently on vancomycin and Zosyn. Vascular surgeon on-call was consulted by the ED physician and advised to continue with IV antibiotics for now and will be seeing the patient tomorrow morning.   PAST MEDICAL HISTORY:  1.  Hypertension.  2.  End-stage renal disease on hemodialysis Tuesday, Thursday, and Saturday; last hemodialysis being yesterday, that is on Thursday.  3.  Coronary artery disease/MI, status post CABG.  4.  CVA on Coumadin.  5.  Hepatitis C.   PAST SURGICAL HISTORY:  1.  CABG.  2.  AV fistula placement.  3.  Neck surgery.  4.  Left chest dialysis catheter placement.   ALLERGIES:  1.  ADHESIVE TAPE.  2.  LATEX. 3.  MORPHINE.   FAMILY HISTORY: Positive for hypertension in the family members.   SOCIAL HISTORY: She is single and lives at home and is ambulatory with the help of a walker. Ex-smoker, quit about a year ago. Denies any history of alcohol or substance abuse.    HOME MEDICATIONS:  1.  Acetaminophen/oxycodone 325/10 mg 1 tablet every 6 hours as needed for pain.  2.  Amiodarone 200 mg tablet 1 tablet orally once a day.  3.  Amitriptyline 25 mg 1 tablet orally once a day.  4.  Aspirin 81 mg 1 tablet orally once a day.  5.  Pantoprazole 40 mg 1 tablet orally once a day.  6.  Renvela carbonate 800 mg tablet orally 2 tablets 3 times a day.  7.  Sensipar 30  mg oral tablet 1 tablet orally once a day.  8.  Warfarin 2.5 mg 1 tablet orally once a day.   REVIEW OF SYSTEMS:  CONSTITUTIONAL: Positive for fever and chills. Negative for generalized weakness or fatigue.  EYES: Negative for blurred vision, double vision. No pain. No redness. No discharge.  EARS, NOSE, AND THROAT: Negative for tinnitus, ear pain, hearing loss, epistaxis, nasal discharge, difficulty swallowing.  RESPIRATORY: Negative for cough or wheezing. Mild dyspnea, but currently no shortness of breath. No hemoptysis. No painful  respirations.  CARDIOVASCULAR: Negative for chest pain, palpitation, dizziness, syncopal episodes, orthopnea, dyspnea on exertion, or pedal edema.  GASTROINTESTINAL: Positive for nausea and had one episode of vomiting, but no abdominal pain. No constipation. No diarrhea. No hematemesis. No melena. No rectal bleeding.  GENITOURINARY: Negative for dysuria.  ENDOCRINE: Negative for polyuria, nocturia, heat or cold intolerance.  HEMATOLOGIC: Negative for anemia, easy bruising or bleeding.  INTEGUMENTARY: Positive for pain and swelling of the left arm with discharging wound, as noted in the history of present illness.   MUSCULOSKELETAL: Negative for neck pain or back pain. No history of arthritis or gout.  NEUROLOGICAL: History of CVA, which is stable. No history of any focal weakness or numbness.  PSYCHIATRIC: Negative for anxiety, insomnia, depression.   PHYSICAL EXAMINATION:  VITAL SIGNS: Temperature 102.5 degrees Fahrenheit, pulse rate 104, respirations 22 per minute, blood pressure 130/84, oxygen saturation 95% on room air.  GENERAL: Well developed, well nourished, alert, in mild distress because of the pain in the left arm, comfortably resting in the bed otherwise.   HEAD: Atraumatic, normocephalic.  EYES: Pupils are equal, react to light and accommodation. No conjunctival pallor. No icterus. Extraocular movements intact.  NOSE: No drainage. No lesions.  EARS: No drainage. No external lesions.  ORAL CAVITY: No mucosal lesions. No exudates.  NECK: Supple. No JVD. No thyromegaly. No carotid bruit. Range of motion of neck within normal limits.  RESPIRATORY: Good respiratory effort. Not using accessory muscles of respiration. Bilateral vesicular breath sounds present. No rales or rhonchi.  CARDIOVASCULAR: S1, S2 regular. No murmurs, gallops, or clicks. Pulses equal at carotid, femoral, and pedal pulses. No peripheral edema.  GASTROINTESTINAL: Abdomen soft, nontender. No hepatosplenomegaly. No  masses. No rigidity. No guarding. Bowel sounds present and equal in all 4 quadrants.  GENITOURINARY: Deferred.  MUSCULOSKELETAL: No joint tenderness or effusion. Range of motion is adequate. Strength and tone equal bilaterally.  SKIN: Left arm 1 cm wide discharging wound present. She does have AV fistula on the left arm, which is bulging and tender to touch.  LYMPHATIC: No cervical lymphadenopathy.  VASCULAR: Good dorsalis pedis and posterior tibial pulses.  NEUROLOGIC: Alert, awake, and oriented x 3. Cranial nerves II-XII grossly intact. No sensory deficit. Motor strength 5/5 in both upper and lower extremities. Plantars downgoing.  PSYCHIATRIC: Alert, awake, and oriented x 3. Judgment and insight adequate. Memory and mood within normal limits.   ANCILLARY DATA:  LABORATORY DATA: Serum glucose 84, BUN 23, creatinine 4.5, sodium 134, potassium 4.3, chloride 97, bicarbonate 27, total calcium 8.9, total protein 9.2, albumin 3.5, total bilirubin 0.7, alkaline phosphatase 171, AST 115, ALT 82. WBC 5.8, hemoglobin 12.1, hematocrit 39.3, platelet count 215,000. Lactic acid venous 1.5.   CHEST X-RAY: Increased left basilar opacity could be due to atelectasis or pneumonia, cardiomegaly, and pulmonary vascular congestion.   EKG: Normal sinus rhythm with ventricular rate of 99 beats per minute. No acute ST-T changes.   ASSESSMENT AND PLAN: A 54 year old African  American female with history of end-stage renal disease on hemodialysis Tuesday, Thursday, and Saturday; hypertension; coronary artery disease status post coronary artery bypass graft; cerebrovascular accident on Coumadin; hepatitis C, presents with the complaints of discharging wound left arm with elevated temperature 102 degrees Fahrenheit.  1.  Cellulitis, left arm, near the site of arteriovenous fistula.  PLAN: Admit to medical floor. Blood cultures. Intravenous antibiotics vancomycin, Zosyn. Follow temperature, labs. Vascular surgical consult  placed for further evaluation and management. Continue wound care.  2.  End-stage renal disease on hemodialysis Tuesday, Thursday, Saturday. Last dialysis on Thursday. The patient stable, clinically. Continue hemodialysis as scheduled. Nephrology consult placed.  3.  Hypertension, stable on home medications. Continue same.  4.  Coronary artery disease, status post coronary artery bypass graft, stable. No acute problems. Monitor. Continue home medications.  5.  History of cerebrovascular accident on Coumadin. Continue same. The patient is stable. No acute neurological problems at this time.  6.  History of hepatitis C, compensated. Liver functions mildly elevated but stable. Continue monitoring.  7.  Deep vein thrombosis prophylaxis. Continue Coumadin.  8.  Gastrointestinal prophylaxis. Proton pump inhibitor.   CODE STATUS: Full code.   TIME SPENT: 50 minutes.    ____________________________ Juluis Mire, MD enr:bm D: 08/03/2014 00:36:04 ET T: 08/03/2014 01:20:28 ET JOB#: [id number]  cc: Juluis Mire, MD, <Dictator> Mikeal Hawthorne. Brynda Greathouse, MD Juluis Mire MD ELECTRONICALLY SIGNED 08/03/2014 18:53

## 2014-10-14 NOTE — Discharge Summary (Signed)
PATIENT NAME:  Vanessa [name], [name] MR#:  [id number] DATE OF BIRTH:  [birth year]  DATE OF ADMISSION:  08/03/2014 DATE OF DISCHARGE:  08/10/2014  DISCHARGE DIAGNOSES:  1.  Fever and sepsis secondary to infected AV graft.  2.  End-stage renal disease, on hemodialysis.  3.  Hypertension due to sepsis, improved. 4.  History of chronic systolic heart failure.  5.  Depression. 6.  History of cerebrovascular accident.  DISCHARGE MEDICATIONS: 1.  Vancomycin 750 after each dialysis, 4 doses and nephrology to reevaluate.  2.  Augmentin 500 mg daily for 10 days.  3.  Aspirin 81 mg daily.  4.  Pantoprazole 40 mg daily. 5.  Renvela 800 mg 2 tablets p.o. t.i.d.  6.  Coumadin 2.5 mg p.o. daily. 7.  Percocet 5/325 every 6 hours as needed for pain.  8.  Sensipar 30 mg p.o. daily. 9.  Amiodarone 200 mg p.o. daily. 10.  Amitriptyline 25 mg p.o. daily. 11.  Midodrine 10 mg daily. 12.  Colace 100 mg p.o. b.i.d.   CONSULTATIONS: Nephrology with Dr. Anthonette Legato, vascular with Dr. Leotis Pain.  HOSPITAL COURSE:  48.  A 54 year old female patient with history of ESRD on hemodialysis comes in because of fever and drainage from the left arm AV fistula. Please look at the history and physical for full details. The patient had her left arm AV fistula placed by Dr. Lucky Cowboy on January 27th. The patient noted to have drainage and fever of 102 Fahrenheit in the Emergency Room. The patient went to see Dr. Lucky Cowboy for the same and was given antibiotics, but because of persistent drainage, one day before and then the fever, she came into the hospital. The patient started on vancomycin and Zosyn and consulted by vascular as well. The patient's blood cultures have been negative. The patient had wound which was opened around 2 cm with some serous discharge and mild erythema and tenderness. The patient admitted and got antibiotics and PermCath was used for hemodialysis. Seen by Dr. Lucky Cowboy and he suggested continuing antibiotics and not  removing the AV fistula. He thought antibiotics along are sufficient. The patient monitored closely. Her blood cultures are negative, white count normalized. Her serosanguineous and purulent discharge from the AV site actually improved and almost the site is closing at this time. The patient needs further dose of vancomycin with dialysis and also p.o. Augmentin. The patient received a dose of Zosyn and we are changing it to Augmentin and she will continue another 5 doses of vancomycin. Dr. Holley Raring will assess for further need of antibiotics.  2.  Chest pain. The patient complained of chest pain after dialysis on 20th of February. The patient transferred to ICU because of chest pain and slightly elevated troponins. The patient's troponins were 0.15 and chest pain relieved with Percocet and not much relieved with nitro, so Dr. Clayborn Bigness has seen the patient. He said she has had multiple interventions and echocardiograms and he did not feel like chest pain is related to coronary artery disease. The patient did not have an echocardiogram, no further cardiac workup. The patient seen by cardiology. He suggested followup with him as an outpatient. The patient has a history of CABG before so we continued her on aspirin and also Coumadin.  3.  The patient has history of hepatitic C. 4.  History of CVA before. She is on Coumadin for that and continue that.  5.  ESRD, on hemodialysis Tuesday, Thursday and Saturday.  6.  Deconditioning. Seen by physical therapy.  They recommended rehab. The patient is requested to go to [facility].  7.  Hypertension. The patient has been running low in terms of blood pressure so we had to hold her BP medications. The patient right now is on Midodrine with each hemodialysis.   DISPOSITION: The patient is going to [facility].   DISCHARGE PHYSICAL EXAMINATION: DISCHARGE VITAL SIGNS: Temperature 98.3, heart rate 81, blood pressure 103/62, sats 92% on 3 liters. The patient does have  3 liters of oxygen all the time.  CARDIOVASCULAR: S1 and S2 regular.  LUNGS: Clear to auscultation.  SKIN: Left arm AV fistula site is slightly open, but no purulent discharge. No tenderness to palpation.  ABDOMEN: Soft, nontender, nondistended. Bowel sounds present.  EXTREMITIES: No extremity edema. No cyanosis, no clubbing. PermCath site is not infection. PermCath site cultures are negative.   TIME SPENT ON DISCHARGE PREPARATION: More than 30 minutes. ____________________________ Epifanio Lesches, MD sk:sb D: 08/10/2014 13:47:19 ET T: 08/10/2014 14:19:04 ET JOB#: [id number]  cc: Epifanio Lesches, MD, <Dictator> Epifanio Lesches MD ELECTRONICALLY SIGNED 08/20/2014 15:38

## 2014-10-14 NOTE — Op Note (Signed)
PATIENT NAME:  KALEYAH, LYKES MR#:  [id number] DATE OF BIRTH:  10-15-1960  DATE OF PROCEDURE:  07/11/2014  PREOPERATIVE DIAGNOSES:  1. End-stage renal disease.  2. 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  3. Hypertension.  4. History of stroke.   POSTOPERATIVE DIAGNOSES:   1. End-stage renal disease.  2. 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  3. Hypertension.  4. History of stroke.   PROCEDURE:  Revision of left arm arteriovenous fistula with ligation of aneurysmal fistula and Artegraft jump graft from fistula just beyond the brachiobasilic anastomosis to the axillary vein with removal of axillary venous stent.   SURGEON: Algernon Huxley, MD.  ANESTHESIA: General.   BLOOD LOSS: 50 mL.   INDICATION FOR PROCEDURE: A 54 year old female well-known to [place] for her dialysis access needs. Her brachiobasilic AV fistula has become markedly aneurysmal and now has prolonged bleeding with dialysis, scabs and impending rupture. She is brought in for surgical revision. She has already had a PermCath placed and her fistula will not be used for several weeks. Risks and benefits were discussed. Informed consent was obtained.   DESCRIPTION OF PROCEDURE: The patient's left upper extremity was sterilely prepped and draped and a sterile surgical field was created.   I started by cutting down just beyond the initial anastomosis of the brachial artery to the basilic vein and dissected out the basilic vein just beyond the anastomosis, encircled with vessel loops.  I then made an incision in the axilla, dissected out the axillary vein.  The stent extended further into the axillary vein that I had anticipated and I dissected out until I could clamp beyond the stent and planned to remove part of the stent to create an anastomosis to the axillary vein. I used a 7 mm diameter Artegraft that was marked for orientation, tunneled  laterally around the aneurysmal fistula and the patient was heparinized with 3500 units of intravenous heparin. I then clamped the basilic vein just beyond the brachial artery anastomosis and transected about 1 cm downstream, doubly ligating the previous fistula distally and removing a copious amount of blood from the fistula. I created an anastomosis with two 6-0 Prolene sutures in an end-to-end fashion in a parachuting technique, the bulldog clamp was placed on the Artegraft and the clamp on the basilic vein was removed with excellent pulsatile flow seen. I then clamp the axillary vein proximally and distally and created a venotomy in the axillary vein. I used an elevator to dissect out the previously placed stent in the axillary vein and excised a large portion of this, transecting this several centimeters proximal to where we would create our anastomosis.  The [facility] was used to help create a plane and free the stent from the axillary vein. This was removed.  The Artegraft was cut and beveled to an appropriate length to match the venotomy and anastomosis was created with a running 6-0 Prolene suture in the usual fashion. A couple of 6-0 Prolene patches were used for hemostasis and hemostasis was complete. The vessel had been flushed and de-aired prior to releasing control. On release, there was nice flow through the Artegraft with a thrill present. The wounds were irrigated. Surgicel and Evicel topical hemostatic agents were placed and hemostasis was achieved. The wounds were then closed with running 3-0 Vicryl and 4-0 Monocryl. Dermabond was placed as dressing.   The patient was extubated and taken to the recovery room in stable  condition, having tolerated the procedure well.     ____________________________ Algernon Huxley, MD jsd:byy D: 07/11/2014 14:50:04 ET T: 07/11/2014 22:20:45 ET JOB#: [id number]  cc: Algernon Huxley, MD, <Dictator> Algernon Huxley MD ELECTRONICALLY SIGNED 07/18/2014 11:53

## 2014-10-14 NOTE — Consult Note (Signed)
PATIENT NAME:  [name], [name] MR#:  [id number] DATE OF BIRTH:  1960-11-15  DATE OF CONSULTATION:  08/05/2014  REFERRING PHYSICIAN:  Dr. Doy Hutching. CONSULTING PHYSICIAN:  Dwayne D. Clayborn Bigness, MD  PRIMARY PHYSICIAN:  Dr. Brynda Greathouse.  NEPHROLOGIST:  Dr. Candiss Norse.   INDICATION: Chest pain.  The patient is admitted with hypotension and infected shunt.  HISTORY OF PRESENT ILLNESS:  The patient is a 54 year old, African American female known to me, hypertension, end-stage renal disease on dialysis Tuesday, Thursday, Saturday; coronary artery disease, myocardial infarction, coronary artery bypass surgery, pericardial effusion with window and treatment at [facility], hepatitis C, CVA, cardiomyopathy, bipolar disorder with noncompliance just recently discharged.  Followed by Dr. Brynda Greathouse and nephrology. The patient recently had shunt revision January 27 for a graft left arm. The patient had persistent trouble with swelling and pain as well as discharge.  The patient states the pain continued and worsened. She was on antibiotics, persistent, recurrent.  She has had chronic chest pain in the past with atypical features.  Patient is also slightly hypotensive, so she was admitted for further evaluation and care.    PAST MEDICAL HISTORY:  Hypertension, end-stage renal disease, coronary artery disease, cardiomyopathy, congestive heart failure, CVA, hepatitis C, history of pericardial effusion, history of bipolar, noncompliance.   PAST SURGICAL HISTORY: Coronary artery bypass surgery, pericardial effusion with drainage and tap. She has had dialysis fistula, shunt replacement, back surgery, cardiac catheterization.  FAMILY HISTORY:  Positive for hypertension.   SOCIAL HISTORY: Single, lives at home, ambulatory, ex-smoker, quit about a year ago. Denies alcohol consumption or substance abuse.   ALLERGIES:  ADHESIVE TAPE, LATEX, MORPHINE.   MEDICATIONS: Acetaminophen and oxycodone every 6 hours as needed, amiodarone 200 mg a day,  amitriptyline 25 mg once a day, aspirin 81 mg a day, Protonix 40 mg a day, Renvela 800 mg 2 tablets 3 times a day, Sensipar 20 mg once a day, warfarin 2.5 once a day.   REVIEW OF SYSTEMS: Denies blackout spells or syncope. No significant nausea or vomiting.  She has had chills, sweats, and fever. Denies weight loss. No weight gain. No hemoptysis or hematemesis. No bright red blood per rectum. No vision change or hearing change. Denies sputum production. Denies cough. She has had left shunt area swelling, pain, tenderness with discharge, as well as some chest pain.   PHYSICAL EXAMINATION:  VITAL SIGNS: Blood pressure was slightly diminished at 110/80, pulse of 100 and irregular, respiratory rate of 16, afebrile.  HEENT: Normocephalic, atraumatic. Pupils equal and reactive to light.  NECK EXAMINATION: Supple. No significant JVD, bruits or adenopathy.  LUNG EXAMINATION: Clear to auscultation and percussion. Bilateral rhonchi. No significant rales. Adequate air movement.  HEART EXAMINATION:  Slightly irregular, tachycardic, systolic ejection murmur along the left sternal border. Positive S3.  ABDOMINAL EXAMINATION: Benign.  EXTREMITY EXAMINATION: Infected shunt.  NEUROLOGICAL EXAMINATION: Intact.  SKIN EXAMINATION: Normal.   LABORATORIES: Glucose 84, BUN of 23, creatinine of 4.5, sodium of 134, potassium 4.3, chloride of 97, bicarbonate of 27, calcium 8.9, albumin 3.5.  LFTs were elevated.  Alkaline phosphatase of 171, AST 115, ALT 82. White count 5.8, hemoglobin 12, hematocrit 39, platelet count of 215,000. Lactic acid 1.5. Chest x-ray:  Increased left basilar opacity, possible pneumonia, some cardiomegaly, no significant congestion.  EKG: Normal sinus rhythm, rate of 100. Nonspecific ST-T wave changes.   ASSESSMENT: Infected shunt, sepsis, hypotension, end-stage renal disease, coronary artery disease, cardiomyopathy, atrial fibrillation, hepatitis C, bipolar, noncompliance.   PLAN: Continue current  therapy.  Recommend broad spectrum antibiotics. Recommend vascular surgery input.  Will probably need treatment with again antibiotics. Will probably have to have the shunt replaced.  In the meantime, continue fluid resuscitation and continue dialysis therapy. Do not recommend echocardiogram. No clear evidence of endocarditis. Continue other therapies.  Cerebrovascular accident, continue Coumadin for now. Hepatitis C, follow up liver function studies. Continue deep vein thrombosis prophylaxis with Coumadin therapy. Recommend reflux therapy as well. Continue to treat the patient medically and have shunt replaced after adequate antibiotic therapy.  I do not recommend any further cardiac studies for chest pain which appears to be atypical. I will continue to follow the patient.  Have the patient follow up as an outpatient upon discharge.   ____________________________ Loran Senters. Clayborn Bigness, MD ddc:sp D: 09/04/2014 13:51:05 ET T: 09/04/2014 14:21:35 ET JOB#: [id number]  cc: Dwayne D. Clayborn Bigness, MD, <Dictator> Yolonda Kida MD ELECTRONICALLY SIGNED 09/11/2014 9:04

## 2014-10-14 NOTE — H&P (Signed)
PATIENT NAME:  [name], [name] MR#:  [id number] DATE OF BIRTH:  1961/01/22  DATE OF ADMISSION:  06/24/2014  REFERRING PHYSICIAN: Elta Guadeloupe R. Jacqualine Code, MD  PRIMARY CARE PHYSICIAN: Mikeal Hawthorne. Brynda Greathouse, MD  ADMITTING PHYSICIAN: Juluis Mire, MD      CHIEF COMPLAINT:  1.  Sudden onset of severe headache with associated nausea and vomiting episode.  2.  Generalized pain.   HISTORY OF PRESENT ILLNESS: A 54 year old African American female with a past medical history of hypertension, coronary artery disease, MI, status post CABG, end-stage renal disease on hemodialysis 3 times a week, history of CVA, chronic anticoagulation, presents to the Emergency Room with complaints of sudden onset of severe headache with associated episode of nausea and vomiting x 1. The patient stated that last evening she suddenly felt headache with associated nausea and vomiting; hence, she called EMS. EMS brought the patient to the Emergency Room for further evaluation. The patient was evaluated by the ED physician and in the Emergency Room the patient complained that she could not move her right arm and right leg, and she complained of weakness of the right side of the body. According to the ED physician, he observed the patient moving her right arm and right lower extremity 1 time in the Emergency Room, and as per the ED physician and EMS also noted the patient walking. The ED physician also mentioned that the patient's son mentions that the patient did not have any weakness on the right upper and lower extremity, but she continued to have the right upper and lower extremity weakness in the Emergency Room, and since she was on Coumadin at home, a CT of the head STAT study was obtained, which was negative for any acute intracranial process. Neurology was consulted by the ED physician because of the acute right-sided weakness and the neurologist, Dr. Tamala Julian, recommended the patient for admission for observation, no TPA at this time, and  also recommended to hold aspirin and her Coumadin until further workup. According to the  neurologist suggestion, the patient is undergoing MRI, MRA, and MRV for further evaluation. The patient received some pain medication for the headache and currently her headache is under control, and she did not have any further episodes of nausea and vomiting. Denies any visual disturbances. No swallowing difficulties. As noted, the patient has end-stage renal failure on hemodialysis 3 times a week, Tuesday, Thursday, Saturday, and she had complete dialysis earlier today. Denies any chest pain. No shortness of breath. No diarrhea. No urinary symptoms.   PAST MEDICAL HISTORY:  1.  Hypertension.  2.  Coronary artery disease/MI, status post CABG.  3.  History of CVA, on Coumadin.  4.  End-stage renal disease, on hemodialysis.  5.  Hepatitis C.  6.  Chronic anticoagulation.   PAST SURGICAL HISTORY:  1.  Neck surgeries.   2.  Status post CABG.  3.  AV fistula placement.   ALLERGIES: MORPHINE, ADHESIVE TAPE, AND LATEX.   SOCIAL HISTORY: Lives at home and her 75 year old daughter lives with her. Denies any history of smoking; however, she was a former smoker, and denies any alcohol usage or illicit drug usage.   FAMILY HISTORY: Positive for prostate cancer and hypertension.   HOME MEDICATIONS:  1.  Amiodarone 200 mg tablet 1 tablet orally once a day.  2.  Aspirin 81 mg 1 tablet orally once a day.  3.  Atorvastatin 40 mg 1 tablet orally once a day.  4.  Dok Plus 1 tablet orally  at bedtime as needed for constipation.  5.  Hydroxyzine hydrochloride 50 mg tablet orally 1 tablet 2 times a day as needed.  6.  Magnesium oxide 400 mg oral tablet 1 tablet orally once a day.  7.  Midodrine 5 mg oral tablet 1 tablet orally 3 times a day.  8.  Pantoprazole 40 mg 1 tablet orally 2 times a day.  9.  Roxicodone 15 mg tablet half tablet orally every 6 hours as needed for pain.  10.  Sensipar 30 mg oral tablet 1 tablet  orally once in the evening.  11.  Warfarin 2.5 mg oral tablet 1 tablet orally once a day.   REVIEW OF SYSTEMS:  CONSTITUTIONAL: Negative for fever, fatigue, or generalized weakness.  EYES: Negative for blurred vision or double vision. No pain. No redness. No discharge.  EARS, NOSE, AND THROAT: Negative for tinnitus, ear pain, hearing loss, epistaxis, nasal discharge, or difficulty swallowing.  RESPIRATORY: Negative for cough, wheezing, dyspnea, hemoptysis, or painful respiration.  CARDIOVASCULAR: Negative for chest pain, palpitations, dizziness, syncopal episodes, orthopnea, or pedal edema.  GASTROINTESTINAL: Positive for episode of nausea and vomiting during the episode of headache. Currently no nausea, no vomiting. No history of any diarrhea. No abdominal pain. No hematemesis.  GENITOURINARY: Negative for dysuria, frequency, or urgency. The patient has end-stage renal disease on hemodialysis 3 times a week.  ENDOCRINE: Negative for polyuria or nocturia. No heat or cold intolerance.  HEMATOLOGIC/LYMPHATIC: Negative for anemia, easy bruising, or bleeding.  INTEGUMENTARY: Negative for acne, skin rash, or lesions.  MUSCULOSKELETAL: Negative for neck pain, back pain, shoulder pain, but she does have generalized aches and pains. History of arthritis present.  NEUROLOGICAL: Slight weakness of her right upper and lower extremities as mentioned in the history of present illness. History of CVA.  PSYCHIATRIC: Negative for anxiety, insomnia, or depression.   PHYSICAL EXAMINATION:  VITAL SIGNS: Temperature 98.4 degrees Fahrenheit, pulse rate 89 per minute, respirations 18 per minute, blood pressure 148/97, O2 saturation is 98% on room air.  GENERAL: Well-developed, well-nourished, alert, and lying in the bed not in any acute distress at this time.  HEAD: Atraumatic, normocephalic.  EYES: Pupils are equal, react to light and accommodation. No conjunctival pallor. No icterus. Extraocular movements intact.   NOSE: No drainage. No lesions.  EARS: No drainage. No external lesions.  ORAL CAVITY: No mucosal lesions. No exudates.  NECK: Supple. No JVD. No thyromegaly. No carotid bruit. Range of motion normal.  RESPIRATORY: Good respiratory effort. Not using accessory muscles of respiration. Bilateral vesicular breath sounds present. No rales, no rhonchi.  CARDIOVASCULAR: S1, S2 regular. No murmurs, gallops, or clicks. Peripheral pulses equal at carotid, femoral, and pedal pulses. No peripheral edema.  ABDOMEN: Soft, nontender. No hepatosplenomegaly. No masses. No rigidity. No guarding. Bowel sounds present and equal in all 4 quadrants.  GENITOURINARY: Deferred.  MUSCULOSKELETAL: No joint tenderness or effusion. Strength and tone: Left side, upper and lower extremities normal.  SKIN: Inspection within normal limits.  LYMPHATIC: No cervical lymphadenopathy.  VASCULAR: Good dorsalis pedis and posterior tibial pulses. AV shunt on the left upper arm.  NEUROLOGIC: Alert, awake, and oriented x 3. Cranial nerves II through XII grossly intact. She has decreased sensation on the right upper and lower extremities. She has also decreased strength on the right upper and lower extremities. Power is 0 to +1 on the right upper and lower extremities. Detailed examination not possible since the patient is complaining of generalized aches and pains.  PSYCHIATRIC: Alert, awake,  and oriented x 3. Judgment and insight are adequate. Memory and mood within normal limits.   LABORATORY DATA: Serum glucose 118, BUN 14, creatinine 3.59, sodium 136, potassium 3.3, chloride 98, total calcium 8.5. Total CK 42. Troponin 0.06. WBC 4.9, hemoglobin 11.9, hematocrit 39.1, platelet count 174,000. Prothrombin time 17.4, INR 1.5.   IMAGING STUDIES:  1.  Chest x-ray: Cardiac enlargement. No evidence of active pulmonary disease.  2.  CT of the head, noncontrast study: No acute intracranial abnormalities. Chronic atrophy and small vessel  ischemic changes. Old bilateral infarcts. No significant changes since prior study.  3.  EKG: Pending at this time.    ASSESSMENT AND PLAN: A 54 year old African American female with past medical history of hypertension, coronary artery disease/myocardial infarction, status post coronary artery bypass graft, history of cerebrovascular accident, end-stage renal disease on hemodialysis 3 times a week, history of hepatitis C, chronic anticoagulation, presents with sudden onset of severe headache and associated nausea and vomiting concerning for subarachnoid hemorrhage/intracranial hemorrhage.  1.  Severe headache with episode of nausea and vomiting concerning for intracranial bleeding since the patient on anticoagulation. Emergency Room physician talked to the neurologist on-call and recommended admission for further observation and further workup. The patient undergoing further workup. Plan: Admit to telemetry, neurological watch. We will hold off Coumadin and aspirin as per neurology recommendations. Obtain MRI, MRA, MRV per neurology recommendations. Echocardiogram and carotid Dopplers requested.  2.  Right-sided weakness, which was observed in the Emergency Room. History of cerebrovascular accident. Query new cerebrovascular accident. Plan: Admit to telemetry and neurological watch and workup as mentioned earlier.  3.  History of cerebrovascular accident.  4.  History of coronary artery disease, status post coronary artery bypass graft, stable clinically. Continue home medications.  5.  End-stage renal disorder on hemodialysis 3 times a week. The patient had hemodialysis yesterday. Continue same and nephrology consultation requested.  6.  Hyperlipidemia, on statin. Continue same.  7.  Chronic anticoagulation, on Coumadin. We will hold off Coumadin per neurology recommendations since there is concern for intracranial bleed.  8.  History of hepatitis C, stable clinically.  9.  Hypertension, blood pressure  controlled.  10.  Deep vein thrombosis prophylaxis: Sequential compression dressings. No anticoagulation per neurology recommendations.  11.  Gastrointestinal prophylaxis: Proton pump inhibitor.   TIME SPENT: 55 minutes.   CODE STATUS: FULL CODE.   ____________________________ Juluis Mire, MD enr:ts D: 06/24/2014 02:59:00 ET T: 06/24/2014 03:52:31 ET JOB#: [id number]  cc: Juluis Mire, MD, <Dictator> Mikeal Hawthorne. Brynda Greathouse, MD Juluis Mire MD ELECTRONICALLY SIGNED 06/24/2014 20:13

## 2014-10-14 NOTE — Consult Note (Signed)
Chief Complaint:  Subjective/Chief Complaint States to be doing better reduced pain shortness of breath is improved denies fever chills or sweats   VITAL SIGNS/ANCILLARY NOTES: **Vital Signs.:   24-Feb-16 05:27  Vital Signs Type Routine  Temperature Temperature (F) 97.8  Celsius 36.5  Temperature Source oral  Pulse Pulse 83  Respirations Respirations 18  Systolic BP Systolic BP [id number]  Diastolic BP (mmHg) Diastolic BP (mmHg) 72  Mean BP 83  Pulse Ox % Pulse Ox % 92  Pulse Ox Activity Level  At rest  Oxygen Delivery 4L  *Intake and Output.:   Daily 24-Feb-16 07:00  Oral Intake      In:  240  IV (Secondary)      In:  94  Dialysis Fluid Removed (ml) ml     Out:  1000  Length of Stay Totals Intake:  2774 Output:  1000    Net:  1774   Brief Assessment:  GEN well developed, well nourished, no acute distress   Cardiac Regular  murmur present  -- LE edema  --Gallop   Respiratory normal resp effort  rhonchi   Gastrointestinal Normal   Gastrointestinal details normal Soft  Nontender  Nondistended   EXTR negative cyanosis/clubbing, negative edema   Lab Results: Routine Chem:  24-Feb-16 01:45   Result Comment TROPONIN - PREVIOUSLY CALL RESULT BY AJO @2309   - ON 08/07/14. TSH.  - RESULTS VERIFIED BY REPEAT TESTING.  Result(s) reported on 08 Aug 2014 at 02:30AM.  Cardiac:  24-Feb-16 01:45   CPK-MB, Serum 0.6 (Result(s) reported on 08 Aug 2014 at 02:23AM.)  Troponin I  0.09 (0.00-0.05 0.05 ng/mL or less: NEGATIVE  Repeat testing in 3-6 hrs  if clinically indicated. >0.05 ng/mL: POTENTIAL  MYOCARDIAL INJURY. Repeat  testing in 3-6 hrs if  clinically indicated. NOTE: An increase or decrease  of 30% or more on serial  testing suggests a  clinically important change)  Routine Coag:  24-Feb-16 01:45   Prothrombin  34.6 (11.4-15.0 NOTE: New Reference Range  07/13/14)  INR 3.4 (INR reference interval applies to patients on anticoagulant therapy. A single INR therapeutic  range for coumarins is not optimal for all indications; however, the suggested range for most indications is 2.0 - 3.0. Exceptions to the INR Reference Range may include: Prosthetic heart valves, acute myocardial infarction, prevention of myocardial infarction, and combinations of aspirin and anticoagulant. The need for a higher or lower target INR must be assessed individually. Reference: The Pharmacology and Management of the Vitamin K  antagonists: the seventh ACCP Conference on Antithrombotic and Thrombolytic Therapy. [id number] Sept:126 (3suppl): X2190819. A HCT value >55% may artifactually increase the PT.  In one study,  the increase was an average of 25%. Reference:  "Effect on Routine and Special Coagulation Testing Values of Citrate Anticoagulant Adjustment in Patients with High HCT Values." American Journal of Clinical Pathology 2006;126:400-405.)   Radiology Results: XRay:    18-Feb-16 23:06, Chest Portable Single View  Chest Portable Single View   REASON FOR EXAM:    fever, shob  COMMENTS:       PROCEDURE: DXR - DXR PORTABLE CHEST SINGLE VIEW  - Aug 02 2014 11:06PM     CLINICAL DATA:  Headache, fever, vomiting and shortness of breath  beginning tonight.    EXAM:  PORTABLE CHEST - 1 VIEW    COMPARISON:  PA and lateral chest 07/07/2014 and 06/23/2014.    FINDINGS:  Dialysis catheter is again seen. Marked enlargement of the  cardiopericardial silhouette is  identified and there is pulmonary  vascular congestion. There is increased density in the left lung  base. The lungs are otherwise unremarkable. No pneumothorax  identified.     IMPRESSION:  Increased left basilar density could be due to atelectasis or  pneumonia.    Cardiomegaly and pulmonary vascular congestion.      Electronically Signed    By: Inge Rise M.D.    On: 08/02/2014 23:16     Verified By: Ramond Dial, M.D.,  Cardiology:    18-Feb-16 22:43, ECG  Ventricular Rate 99   Atrial Rate 99  P-R Interval 222  QRS Duration 116  QT 382  QTc 490  P Axis 78  R Axis -15  T Axis 82  ECG interpretation   Sinus rhythm with 1st degree A-V block with occasional Premature ventricular complexes  Left ventricular hypertrophy with QRS widening  T wave abnormality, consider lateral ischemia  Prolonged QT  Abnormal ECG  When compared with ECG of 02-Aug-2014 22:42,  Premature ventricular complexes are now Present  PR interval has increased  Right bundle branch block is no longer Present  ----------unconfirmed----------  Confirmed by OVERREAD, NOT ([id number]), editor PEARSON, BARBARA ([id number]) on 08/03/2014 10:13:06 AM  ECG     20-Feb-16 15:04, ECG  Ventricular Rate 100  Atrial Rate 100  P-R Interval 194  QRS Duration 126  QT 400  QTc 516  P Axis 74  R Axis -14  T Axis 76  ECG interpretation   Sinus rhythm with occasional , and consecutive Premature ventricular complexes and Fusion complexes  Possible Left atrial enlargement  Right bundle branch block  Left ventricular hypertrophy  Inferior infarct , age undetermined  Abnormal ECG  When compared with ECG of 02-Aug-2014 22:43,  Fusion complexes are now Present  Premature ventricular complexes are now Present  Right bundle branch block is now Present  Confirmed by Fletcher Anon, MUHAMMAD ([id number]) on 08/06/2014 9:49:39 AM    Overreader: Kathlyn Sacramento  ECG     23-Feb-16 22:14, ECG  Ventricular Rate 84  Atrial Rate 84  P-R Interval 218  QRS Duration 132  QT 458  QTc 541  P Axis 80  R Axis -6  T Axis 110  ECG interpretation   Sinus rhythm with 1st degree A-V block with occasional Premature ventricular complexes  Possible Left atrial enlargement  Right bundle branch block  Inferior infarct (cited on or before 04-Aug-2014)  T wave abnormality, consider lateral ischemia  Abnormal ECG  When compared with ECG of 04-Aug-2014 15:04,  Premature ventricular complexes are now Present  Confirmed by Humphrey Rolls, SHAUKAT ([id number]) on  08/09/2014 8:04:01 AM    Overreader: Neoma Laming  ECG    Assessment/Plan:  Assessment/Plan:  Assessment atypical chest pain  mild persistent shortness of breath dialysis shunt infection  recurrent chest pain  cardiomyopathy  coronary artery disease  end-stage renal disease  history of hypertension  currently mild hypotension  atrial fibrillation GERD .   Plan continue broad-spectrum antibiotics  agree with vascular input for possible shunt revision  continue anticoagulation for AFib  agree with dialysis therapy 4 end-stage renal disease  continue therapy for congestive heart failure  cardiomyopathy appears to be stable continue current therapy  continue rate control for atrial fibrillation  agree with therapy for reflux symptoms  medical therapy for now   Electronic Signatures: Yolonda Kida (MD)  (Signed 25-Feb-16 09:19)  Authored: Chief Complaint, VITAL SIGNS/ANCILLARY NOTES, Brief Assessment, Lab Results, Radiology Results, Assessment/Plan  Last Updated: 25-Feb-16 09:19 by Lujean Amel D (MD)

## 2014-10-15 ENCOUNTER — Inpatient Hospital Stay
Admission: EM | Admit: 2014-10-15 | Discharge: 2014-10-17 | DRG: 291 | Disposition: A | Discharge status: Home Health | Payer: Medicare Other | Attending: Internal Medicine | Admitting: Internal Medicine

## 2014-10-15 DIAGNOSIS — R531 Weakness: Secondary | ICD-10-CM

## 2014-10-15 DIAGNOSIS — R079 Chest pain, unspecified: Secondary | ICD-10-CM | POA: Diagnosis present

## 2014-10-15 DIAGNOSIS — I340 Nonrheumatic mitral (valve) insufficiency: Secondary | ICD-10-CM | POA: Diagnosis present

## 2014-10-15 DIAGNOSIS — J9610 Chronic respiratory failure, unspecified whether with hypoxia or hypercapnia: Secondary | ICD-10-CM | POA: Diagnosis present

## 2014-10-15 DIAGNOSIS — I5042 Chronic combined systolic (congestive) and diastolic (congestive) heart failure: Secondary | ICD-10-CM | POA: Diagnosis present

## 2014-10-15 DIAGNOSIS — Z7982 Long term (current) use of aspirin: Secondary | ICD-10-CM

## 2014-10-15 DIAGNOSIS — I252 Old myocardial infarction: Secondary | ICD-10-CM | POA: Diagnosis present

## 2014-10-15 DIAGNOSIS — D631 Anemia in chronic kidney disease: Secondary | ICD-10-CM | POA: Diagnosis present

## 2014-10-15 DIAGNOSIS — Z7901 Long term (current) use of anticoagulants: Secondary | ICD-10-CM

## 2014-10-15 DIAGNOSIS — Z992 Dependence on renal dialysis: Secondary | ICD-10-CM

## 2014-10-15 DIAGNOSIS — Z955 Presence of coronary angioplasty implant and graft: Secondary | ICD-10-CM

## 2014-10-15 DIAGNOSIS — I120 Hypertensive chronic kidney disease with stage 5 chronic kidney disease or end stage renal disease: Secondary | ICD-10-CM | POA: Diagnosis present

## 2014-10-15 DIAGNOSIS — I272 Other secondary pulmonary hypertension: Secondary | ICD-10-CM | POA: Diagnosis present

## 2014-10-15 DIAGNOSIS — I2510 Atherosclerotic heart disease of native coronary artery without angina pectoris: Secondary | ICD-10-CM | POA: Diagnosis present

## 2014-10-15 DIAGNOSIS — Z8673 Personal history of transient ischemic attack (TIA), and cerebral infarction without residual deficits: Secondary | ICD-10-CM | POA: Diagnosis not present

## 2014-10-15 DIAGNOSIS — E875 Hyperkalemia: Secondary | ICD-10-CM | POA: Diagnosis present

## 2014-10-15 DIAGNOSIS — I5043 Acute on chronic combined systolic (congestive) and diastolic (congestive) heart failure: Principal | ICD-10-CM | POA: Diagnosis present

## 2014-10-15 DIAGNOSIS — Z87891 Personal history of nicotine dependence: Secondary | ICD-10-CM

## 2014-10-15 DIAGNOSIS — N186 End stage renal disease: Secondary | ICD-10-CM | POA: Diagnosis present

## 2014-10-15 DIAGNOSIS — I9589 Other hypotension: Secondary | ICD-10-CM | POA: Diagnosis present

## 2014-10-15 DIAGNOSIS — I509 Heart failure, unspecified: Secondary | ICD-10-CM

## 2014-10-15 DIAGNOSIS — E785 Hyperlipidemia, unspecified: Secondary | ICD-10-CM | POA: Diagnosis present

## 2014-10-15 DIAGNOSIS — R072 Precordial pain: Secondary | ICD-10-CM

## 2014-10-15 DIAGNOSIS — Z79899 Other long term (current) drug therapy: Secondary | ICD-10-CM

## 2014-10-15 DIAGNOSIS — I10 Essential (primary) hypertension: Secondary | ICD-10-CM | POA: Diagnosis present

## 2014-10-15 LAB — COMPREHENSIVE METABOLIC PANEL
ALT: 29 U/L (ref 14–54)
AST: 42 U/L — ABNORMAL HIGH (ref 15–41)
Albumin: 3.3 g/dL — ABNORMAL LOW (ref 3.5–5.0)
Alkaline Phosphatase: 131 U/L — ABNORMAL HIGH (ref 38–126)
Anion gap: 19 — ABNORMAL HIGH (ref 5–15)
BUN: 115 mg/dL — AB (ref 6–20)
CALCIUM: 7.7 mg/dL — AB (ref 8.9–10.3)
CO2: 20 mmol/L — ABNORMAL LOW (ref 22–32)
Chloride: 94 mmol/L — ABNORMAL LOW (ref 101–111)
Creatinine, Ser: 12.58 mg/dL — ABNORMAL HIGH (ref 0.44–1.00)
GFR calc Af Amer: 3 mL/min — ABNORMAL LOW (ref >60)
GFR calc non Af Amer: 3 mL/min — ABNORMAL LOW (ref >60)
Glucose, Bld: 74 mg/dL (ref 65–99)
Potassium: >7.5 mmol/L (ref 3.5–5.1)
SODIUM: 133 mmol/L — AB (ref 135–145)
Total Bilirubin: 0.7 mg/dL (ref 0.3–1.2)
Total Protein: 8.2 g/dL — ABNORMAL HIGH (ref 6.5–8.1)

## 2014-10-15 LAB — CBC WITH DIFFERENTIAL/PLATELET
BASOS ABS: 0 10*3/uL (ref 0–0.1)
Eosinophils Absolute: 0.1 10*3/uL (ref 0–0.7)
Eosinophils Relative: 2 %
HCT: 41.5 % (ref 35.0–47.0)
HEMOGLOBIN: 12.5 g/dL (ref 12.0–16.0)
Lymphs Abs: 1.4 10*3/uL (ref 1.0–3.6)
MCH: 25.5 pg — AB (ref 26.0–34.0)
MCHC: 30.2 g/dL — ABNORMAL LOW (ref 32.0–36.0)
MCV: 84.4 fL (ref 80.0–100.0)
Monocytes Absolute: 0.7 10*3/uL (ref 0.2–0.9)
Neutro Abs: 2.4 10*3/uL (ref 1.4–6.5)
PLATELETS: 147 10*3/uL — AB (ref 150–440)
RBC: 4.92 MIL/uL (ref 3.80–5.20)
RDW: 19.8 % — ABNORMAL HIGH (ref 11.5–14.5)
WBC: 4.6 10*3/uL (ref 3.6–11.0)

## 2014-10-15 MED ORDER — SODIUM CHLORIDE 0.9 % IV SOLN: 100.0000 mL | INTRAVENOUS | Status: DC | PRN

## 2014-10-15 MED ORDER — HEPARIN (PORCINE) IN NACL 100-0.45 UNIT/ML-% IJ SOLN
850.0000 [IU]/h | INTRAMUSCULAR | Status: DC
  Administered 2014-10-16: 850 [IU]/h via INTRAVENOUS
  Filled 2014-10-15 (×2): qty 250

## 2014-10-15 MED ORDER — DEXTROSE 50 % IV SOLN: 25.0000 g | INTRAVENOUS | Status: AC

## 2014-10-15 MED ORDER — ALBUTEROL SULFATE (2.5 MG/3ML) 0.083% IN NEBU
INHALATION_SOLUTION | RESPIRATORY_TRACT | Status: AC
  Administered 2014-10-15: 21:00:00
  Filled 2014-10-15: qty 12

## 2014-10-15 MED ORDER — ALBUTEROL SULFATE (2.5 MG/3ML) 0.083% IN NEBU
10.0000 mg/h | INHALATION_SOLUTION | RESPIRATORY_TRACT | Status: DC
  Administered 2014-10-15: 10 mg/h via RESPIRATORY_TRACT

## 2014-10-15 MED ORDER — ASPIRIN 81 MG PO CHEW
CHEWABLE_TABLET | ORAL | Status: AC
  Administered 2014-10-15: 324 mg via ORAL
  Filled 2014-10-15: qty 4

## 2014-10-15 MED ORDER — DEXTROSE 50 % IV SOLN
INTRAVENOUS | Status: AC
  Filled 2014-10-15: qty 50

## 2014-10-15 MED ORDER — DEXTROSE 50 % IV SOLN
INTRAVENOUS | Status: AC
  Administered 2014-10-15: 22:00:00
  Filled 2014-10-15: qty 100

## 2014-10-15 MED ORDER — CALCIUM GLUCONATE 10 % IV SOLN
INTRAVENOUS | Status: AC
  Administered 2014-10-15: 1000 mg via INTRAVENOUS
  Filled 2014-10-15: qty 10

## 2014-10-15 MED ORDER — ALBUTEROL SULFATE (2.5 MG/3ML) 0.083% IN NEBU
INHALATION_SOLUTION | RESPIRATORY_TRACT | Status: AC
  Administered 2014-10-15: 22:00:00
  Filled 2014-10-15: qty 9

## 2014-10-15 MED ORDER — NITROGLYCERIN 2 % TD OINT: 0.5000 [in_us] | TOPICAL_OINTMENT | Freq: Once | TRANSDERMAL | Status: DC

## 2014-10-15 MED ORDER — INSULIN ASPART 100 UNIT/ML ~~LOC~~ SOLN
SUBCUTANEOUS | Status: AC
  Administered 2014-10-15: 22:00:00
  Filled 2014-10-15: qty 10

## 2014-10-15 MED ORDER — INSULIN REGULAR HUMAN 100 UNIT/ML IJ SOLN
10.0000 [IU] | INTRAMUSCULAR | Status: AC
  Filled 2014-10-15: qty 0.1

## 2014-10-15 MED ORDER — HEPARIN BOLUS VIA INFUSION
4000.0000 [IU] | Freq: Once | INTRAVENOUS | Status: DC
  Filled 2014-10-15: qty 4000

## 2014-10-15 MED ORDER — HEPARIN (PORCINE) IN NACL 100-0.45 UNIT/ML-% IJ SOLN: 10.0000 [IU]/kg/h | Freq: Once | INTRAMUSCULAR | Status: DC

## 2014-10-15 MED ORDER — SODIUM BICARBONATE 8.4 % IV SOLN
INTRAVENOUS | Status: AC
  Filled 2014-10-15: qty 50

## 2014-10-15 MED ORDER — HEPARIN SODIUM (PORCINE) 5000 UNIT/ML IJ SOLN
60.0000 [IU]/kg | Freq: Once | INTRAMUSCULAR | Status: DC
  Administered 2014-10-16: 3550 [IU] via INTRAVENOUS

## 2014-10-15 MED ORDER — ONDANSETRON 8 MG PO TBDP
ORAL_TABLET | ORAL | Status: AC
  Administered 2014-10-15: 8 mg via ORAL
  Filled 2014-10-15: qty 1

## 2014-10-15 MED ORDER — ATROPINE SULFATE 0.1 MG/ML IJ SOLN
INTRAMUSCULAR | Status: AC
  Filled 2014-10-15: qty 10

## 2014-10-15 MED ORDER — ASPIRIN 81 MG PO CHEW
324.0000 mg | CHEWABLE_TABLET | Freq: Once | ORAL | Status: AC
  Administered 2014-10-15: 324 mg via ORAL

## 2014-10-15 MED ORDER — ONDANSETRON 8 MG PO TBDP
8.0000 mg | ORAL_TABLET | Freq: Once | ORAL | Status: AC
  Administered 2014-10-15: 8 mg via ORAL

## 2014-10-15 MED ORDER — SODIUM CHLORIDE 0.9 % IV SOLN
Freq: Once | INTRAVENOUS | Status: AC
  Administered 2014-10-15: 20:00:00 via INTRAVENOUS

## 2014-10-15 NOTE — ED Notes (Signed)
Report off to kimrey rn   

## 2014-10-15 NOTE — ED Notes (Signed)
Numerous attempts for with ultrasound unsuccessful.  md aware. Right foot stuck  Without success.  No iv access.  md aware.  meds given.  Pt tolerated well.  Skin warm and dry.

## 2014-10-15 NOTE — H&P (Signed)
[facility] Medical Consultation  [name] [id number] DOB: January 02, 1961 DOA: 10/15/2014   ED/Referring physician: Cinda [name] PCP: Vanessa Noble, MD   Chief Complaint: Weakness, chest pain  HPI: [name] is a 54 y.o. female who is end-stage renal disease on hemodialysis. She normally gets dialysis Tuesdays Thursdays and Saturdays. She missed her last dialysis session due to a complication with her AV graft. She states that at the prior dialysis session her AV graft "blew" and that her arm was too swollen for her to be able to access her graft for her last dialysis session. Over the past 2 days she got progressively weaker. Her daughter who is present in the room on interview stated that on the day of presentation to the ED she was not able to stand up and walk anymore due to her weakness. Patient was also having central chest pain as well as shortness of breath, and an episode of significant vomiting. In the ED she was found to have significant EKG changes with large peaked T waves. Her potassium came back at greater than 7.5. Hospitalist was called for admission, and nephrology was called for urgent dialysis.  Past Medical History  Diagnosis Date  . Heart failure   . Renal failure   . Hypertension   . Myocardial infarction   . Hyperlipidemia   . Stroke 2011    Past Surgical History  Procedure Laterality Date  . Dialysis fistula creation    . Coronary angioplasty with stent placement  2013    Family History  Problem Relation Age of Onset  . Hypertension    . Cancer    . Renal Disease      Social History  reports that she has quit smoking. Her smoking use included Cigarettes. She quit after 20 years of use. She does not have any smokeless tobacco history on file. She reports that she does not drink alcohol or use illicit drugs.  Allergies  Allergen Reactions  . Morphine And Related Hives  . Adhesive [Tape] Rash  . Latex Rash    Prior to  Admission medications   Medication Sig Start Date End Date Taking? Authorizing Provider  aspirin 81 MG tablet Take 81 mg by mouth daily.    Historical Provider, MD  hydrOXYzine (ATARAX/VISTARIL) 25 MG tablet Take 1 tablet by mouth 3 (three) times daily. 05/18/13   Historical Provider, MD  ISOSORBIDE PO Take 1 tablet by mouth daily.    Historical Provider, MD  losartan (COZAAR) 50 MG tablet Take 1 tablet by mouth daily. 05/25/13   Historical Provider, MD  metoprolol succinate (TOPROL-XL) 50 MG 24 hr tablet Take 1 tablet by mouth daily. 05/25/13   Historical Provider, MD  pantoprazole (PROTONIX) 40 MG tablet Take 1 tablet by mouth daily. 05/07/13   Historical Provider, MD  SENSIPAR 30 MG tablet Take 1 tablet by mouth daily. 05/10/13   Historical Provider, MD    Review of Systems:  Constitutional: fatigue, weakness, no fevers/chills, weight changes.  Eyes: No blurred or double vision, pain, redness ENT: No ear pain, hearing loss, discharge, difficulty swallowing Resp: Dyspnea, no cough, wheeze, hemoptysis Cardio-vascular: chest pain, no orthopnea, edema, palpitations, syncope GI: nausea/vomiting without diarrhea, abdominal pain, or change in bowel habits GU: No dysuria, hematuria, frequency, incontinence Endocrine: No nocturia, thyroid problems, heat or cold intolerance, thirst Hematologic/Lymphatic: No anemia, easy bruising bleeding, swollen glands Integumentary: No acne, rash, lesions  Musculoskeletal: Generalized muscular weakness, no arthritis, joint swelling, gout Neuro: No numbness,  weakness, dysarthria, ataxia, headache, migraine, seizure Psych: No anxiety, insomnia, mania, depression  Physical Exam: Constitutional: Filed Vitals:   10/15/14 2011 10/15/14 2032 10/15/14 2245 10/15/14 2300  BP: 90/50 105/72 120/71   Pulse: 74 66    Temp:      TempSrc:      Resp: 13  18   Height:      Weight:      SpO2: 97% 100% 96% 94%   Wt Readings from Last 3 Encounters:  10/15/14 69.854 kg  (154 lb)  05/29/13 73.029 kg (161 lb)   General:  This is a well-groomed female sitting up in bed in mild respiratory distress who appears fatigued HEENT: PERRL, EOMI, no scleral icterus, no conjunctivitis, no difficulty hearing Neck: supple, no masses, non tender, no cervical adenopathy, no JVD, thyroid not enlarged Cardiovascular: RRR, no m/r/g, no S3/S4, no LE edema. Respiratory: CTA bilaterally, no wheeze, no rales, no ronchi, breath sounds not diminished, no increased respiratory effort Abdomen: soft, nontender, nondistended, no mass, bowel sounds present  Musculoskeletal: 4/5 muscular strength x4 extremities, full spontaneous range of motion throughout, no cyanosis/clubbing Skin: no rash, no lesions, no erythema, warm and dry  Lymphatic: No adenopathy Neurologic: Cranial nerves intact, sensation intact, no dysarthria, no aphasia Psychiatric: Alert, Oriented to time, person, place, circumstance, cooperative, not confused, not agitated, not depressed         Labs on Admission:  Basic Metabolic Panel:  Recent Labs Lab 10/15/14 1900  NA 133*  K >7.5*  CL 94*  CO2 20*  GLUCOSE 74  BUN 115*  CREATININE 12.58*  CALCIUM 7.7*   Liver Function Tests:  Recent Labs Lab 10/15/14 1900  AST 42*  ALT 29  ALKPHOS 131*  BILITOT 0.7  PROT 8.2*  ALBUMIN 3.3*   No results for input(s): LIPASE, AMYLASE in the last 168 hours. No results for input(s): AMMONIA in the last 168 hours. CBC:  Recent Labs Lab 10/15/14 1900  WBC 4.6  NEUTROABS 2.4  HGB 12.5  HCT 41.5  MCV 84.4  PLT 147*   Cardiac Enzymes: No results for input(s): CKTOTAL, CKMB, CKMBINDEX, TROPONINI in the last 168 hours. BNP (last 3 results) No results for input(s): BNP in the last 8760 hours.  ProBNP (last 3 results) No results for input(s): PROBNP in the last 8760 hours.  CBG: No results for input(s): GLUCAP in the last 168 hours.  Radiological Exams on Admission: No results found.  EKG: Independently  reviewed. Right bundle branch block which seems like it has been present from prior infarct, significantly peaked T waves.  Assessment/Plan Principal Problem:   Hyperkalemia, diminished renal excretion - patient was given bicarbonate twice, IV calcium, and IV insulin with dextrose in the ED with good stabilization of her EKG changes on the monitor. Nephrology was consult to stat for urgent dialysis. Admit patient to ICU floor, appreciate nephrology assistance in arranging for urgent dialysis. Active Problems:   Chest pain - with positive troponin. This is in the setting of end-stage renal disease so it's a little hard to distinguish. However she was started on a heparin drip in the ED so we will continue this on the floor, and continue to trend her cardiac enzymes. We will consult cardiology, and get an echocardiogram.   ESRD on hemodialysis - nephrology consult for assistance with hemodialysis as above   Generalized weakness - likely secondary to her uremia and hyperkalemia, monitor and reassess after dialysis and improvement of electrolytes.   Congestive heart failure (CHF) -  this is a chronic problem, potentially acutely exacerbated by fluid overload due to missing dialysis. She is getting urgent dialysis as above. She'll be placed on renal fluid restricted diet.   HTN (hypertension) - chronic problem, continue home medications for this.    Code Status: Full DVT Prophylaxis: Full anticoagulation on heparin drip for ACS.  Time spent on admission: 50 minutes.  Jacqulyn Bath [facility] 10/15/2014, 11:18 PM

## 2014-10-15 NOTE — ED Notes (Signed)
Pt brought in via ems from home with weakness and chest pain.  Pt missed dialysis Saturday and today started feeling really bad.  States missed dialysis due to infiltration in left arm.  Pt has difficculty standing today.  Chest pain in left side of chest.  Pt has sob and dizziness with nausea.  Oxygen in place.  Skin warm and dry.

## 2014-10-15 NOTE — ED Notes (Addendum)
Cinda Quest, MD with an order for 1 gram of Calcium gluconate via slow IVP. Order to be entered and carried by this RN.

## 2014-10-15 NOTE — ED Notes (Signed)
Dr. Cinda Quest at bedside requesting results from patient's recently drawn lab studies. Patient with episode of BRADYcardia to the 30s. This RN called lab to obtained results, however the specimens are currently being processed at this time. MD made aware. VORB from [place], MD for continuous Albuterol (10mg ) SVN; order to be entered and carried by this RN. MD also requesting a repeat EKG; ED tech at bedside to perform.

## 2014-10-15 NOTE — Progress Notes (Signed)
ANTICOAGULATION CONSULT NOTE - Initial Consult  Pharmacy Consult for [place], [place] Indication: chest pain/ACS  Allergies  Allergen Reactions  . Morphine And Related Rash  . Latex Other (See Comments)    blisters    Patient Measurements: Height: 5\' 6"  (167.6 cm) Weight: 154 lb (69.854 kg) IBW/kg (Calculated) : 59.3 Heparin Dosing Weight: 69.9  Vital Signs: Temp: 98 F (36.7 C) (05/02 1623) Temp Source: Oral (05/02 1623) BP: 116/75 mmHg (05/02 1849) Pulse Rate: 56 (05/02 1849)  Labs: No results for input(s): HGB, HCT, PLT, APTT, LABPROT, INR, HEPARINUNFRC, CREATININE, CKTOTAL, CKMB, TROPONINI in the last 72 hours.  CrCl cannot be calculated (Patient has no serum creatinine result on file.).   Medical History: Past Medical History  Diagnosis Date  . Heart failure   . Renal failure   . Hypertension   . Myocardial infarction   . Hyperlipidemia   . Stroke 2011    Medications:  Scheduled:  . heparin  4,000 Units Intravenous Once  . nitroGLYCERIN  0.5 inch Topical Once    Assessment: Patient with ACS and no h/o anticoagulants per PTA med list. Labs pending.    Goal of Therapy:  Heparin level 0.3-0.7 units/ml Monitor platelets by anticoagulation protocol: Yes   Plan:  Give 4000 units bolus x 1 Start heparin infusion at 850 units/hr Check anti-Xa level in 6 hours and daily while on heparin Continue to monitor H&H and platelets  Ulice Dash D 10/15/2014,7:29 PM

## 2014-10-15 NOTE — ED Notes (Signed)
Pt B/P is in the low 90s MD aware told to hold other meds right now. Given a One time bolus of NS 532ml. Will continue to monitor.

## 2014-10-15 NOTE — ED Notes (Signed)
Pt given multiple IVP meds due to high K+ and changes in EKG since 2051. Nurse staying at the bedside since changes in rate at 2045. Admitting MD to Room at 2154. Dr. Cinda Quest in and out monitoring pt. Medications helping the changes in EKG. However, pt needs Emergent Dialysis K+ above 7.5.

## 2014-10-15 NOTE — ED Provider Notes (Signed)
[facility] Emergency Department Provider Note    ____________________________________________  Time seen: 1700  I have reviewed the triage vital signs and the nursing notes.   HISTORY  Chief Complaint Weakness and Vascular Access Problem  Patient reports not feeling well and chest pain to me    HPI [name] is a 54 y.o. female patient reports she has been feeling bad week having difficulty standing since yesterday. She says she is having been spent having chest pain and tightness which feels something like her previous MI but not anywhere near as severe. Patient reports the pain gets worse with exercise and better if she rests. Patient complains of nausea and vomiting and shortness of breath. Patient denies diarrhea or fever coughing or any other complaints. The patient's chest pain is in the center and left side of her chest in certain as to how long it lasts and seems to be lasting since yesterday although not sure about that it is moderate in nature and again tight and heavy in quality    Past Medical History  Diagnosis Date  . Heart failure   . Renal failure   . Hypertension   . Myocardial infarction   . Hyperlipidemia   . Stroke 2011    Patient Active Problem List   Diagnosis Date Noted  . Retrosternal chest pain 05/30/2013  . Gallstones 05/29/2013    Past Surgical History  Procedure Laterality Date  . Dialysis fistula creation    . Coronary angioplasty with stent placement  2013    Current Outpatient Rx  Name  Route  Sig  Dispense  Refill  . aspirin 81 MG tablet   Oral   Take 81 mg by mouth daily.         . hydrOXYzine (ATARAX/VISTARIL) 25 MG tablet   Oral   Take 1 tablet by mouth 3 (three) times daily.         . ISOSORBIDE PO   Oral   Take 1 tablet by mouth daily.         Marland Kitchen losartan (COZAAR) 50 MG tablet   Oral   Take 1 tablet by mouth daily.         . metoprolol succinate (TOPROL-XL) 50 MG 24 hr tablet  Oral   Take 1 tablet by mouth daily.         . pantoprazole (PROTONIX) 40 MG tablet   Oral   Take 1 tablet by mouth daily.         . SENSIPAR 30 MG tablet   Oral   Take 1 tablet by mouth daily.           Allergies Morphine and related and Latex  No family history on file.  Social History History  Substance Use Topics  . Smoking status: Former Smoker -- 20 years    Types: Cigarettes  . Smokeless tobacco: Not on file  . Alcohol Use: No    Review of Systems  Constitutional: Negative for fever. Eyes: Negative for visual changes. ENT: Negative for sore throat. Genitourinary: Negative for dysuria. Musculoskeletal: Negative for back pain. Skin: Negative for rash. Neurological: Negative for headaches, focal weakness or numbness.  Review of systems is negative except for as noted in the history of present illness  ____________________________________________   PHYSICAL EXAM:  VITAL SIGNS: ED Triage Vitals  Enc Vitals Group     BP 10/15/14 1623 124/67 mmHg     Pulse Rate 10/15/14 1623 59     Resp  10/15/14 1623 16     Temp 10/15/14 1623 98 F (36.7 C)     Temp Source 10/15/14 1623 Oral     SpO2 10/15/14 1623 99 %     Weight 10/15/14 1623 154 lb (69.854 kg)     Height 10/15/14 1623 5\' 6"  (1.676 m)     Head Cir --      Peak Flow --      Pain Score 10/15/14 1625 10     Pain Loc --      Pain Edu? --      Excl. in [place]? --      Constitutional: Alert and oriented. Patient appears to feel ill Eyes: Conjunctivae are normal. PERRL. Normal extraocular movements. ENT   Head: Normocephalic and atraumatic.   Nose: No congestion/rhinnorhea.   Mouth/Throat: Mucous membranes are moist.   Neck: No stridor. Hematological/Lymphatic/Immunilogical: No cervical lymphadenopathy. Cardiovascular: Normal rate, regular rhythm. Normal and symmetric distal pulses are present in all extremities. No murmurs, rubs, or gallops. Respiratory: Normal respiratory effort  without tachypnea nor retractions. Breath sounds are clear and equal bilaterally. No wheezes/rales/rhonchi. Gastrointestinal: Soft and nontender. No distention. No abdominal bruits. There is no CVA tenderness. Genitourinary: Deferred Musculoskeletal: Nontender with normal range of motion in all extremities. No joint effusions.  No lower extremity tenderness nor edema. Neurologic:  Normal speech and language. No gross focal neurologic deficits are appreciated. Speech is normal. No gait instability. Skin:  Skin is warm, dry and intact. No rash noted. Psychiatric: Mood and affect are normal. Speech and behavior are normal. Patient exhibits appropriate insight and judgment. As we are working on the attempting IV access the patient reports first her feet are tingly and painful and then as we put the external jugular and because we are unable to find any other access she begins complaining of bilateral hand tingling and lip tingling appears to be due to hyperventilation  ____________________________________________    LABS (pertinent positives/negatives)  Potassium returns at over 7.5  ____________________________________________   EKG  Initial EKG shows right bundle branch block with first-degree block at a rate of 65 normal axis repeat EKG done at 1854 shows a right bundle-branch block with markedly widened QRS duration there does not appear to be any significant ST segment elevation or depression however compared to the previous EKG  ____________________________________________    RADIOLOGY    ____________________________________________   PROCEDURES  Procedure(s) performed: None  Critical Care performed: yes over 30 minutes   EJ inserted by me. Patient becomes more somnlent. Monitor wave form begins to look sine wave like. Lab has not returned K+ 1 amp[ bicarb pushed with partial normalization of ekg wave form. Ca++ pushed then albuterol neb given. Wave form deteriorates anyway  (lab reports K as over 7.5) Renal called and will schedule emergent dialysis now. Wave f orm deteriorated further. 2nd amp bicarb pusheed and then d50 and 5 mg aspart insulin. Dialysis ready but will do in CCU. Hospitalist doctor assumes care at 64.   ____________________________________________   INITIAL IMPRESSION / ASSESSMENT AND PLAN / ED COURSE  Pertinent labs & imaging results that were available during my care of the patient were reviewed by me and considered in my medical decision making (see chart for details).    ____________________________________________   FINAL CLINICAL IMPRESSION(S) / ED DIAGNOSES  Final diagnoses:  Acute hyperkalemia  Precordial pain    Nena Polio, MD 10/15/14 2213

## 2014-10-15 NOTE — ED Notes (Signed)
Pt comes into the ED via EMS from home with c/o generalized weakness with a loss of appetite, chest pain and problems with dialysis site on the left upper since Saturday..states she missed dialysis on Saturday due to infiltration of site and has continued to have increased fatigue since.pt presents with LET wrapped around site, removed and skin cleaned.the patient is in NAD, respirations WNL.Skin WNL.[name]

## 2014-10-15 NOTE — ED Notes (Signed)
Iv started by dr Cinda Quest in right EJ.  Tolerated well.  Labs sent.  Repeat ekg was done and seen by dr Cinda Quest.  Report off to [employer].

## 2014-10-15 NOTE — Progress Notes (Signed)
[facility]  ROUNDING NOTE   Subjective:   Emergent consult for hyperkalemia. Found to have potassium >7.5 with t wave changes and hypotension.  Patient admitted to CCU and emergent hemodialysis started. 1 K bath for first hour. 3 hour treatment.  UF goal of 3 litres.  Patient had 1 hour of treatment on Thursday when she infiltrated her AVF. This was tender so she decided To not go for dialysis on Saturday. She became weak and tired. Unable to move her upper extremities so  Brought to ED.   Objective:  Vital signs in last 24 hours:  Temp:  [98 F (36.7 C)] 98 F (36.7 C) (05/02 1623) Pulse Rate:  [56-74] 66 (05/02 2032) Resp:  [13-18] 13 (05/02 2011) BP: (90-124)/(50-98) 105/72 mmHg (05/02 2032) SpO2:  [94 %-100 %] 100 % (05/02 2032) Weight:  [69.854 kg (154 lb)] 69.854 kg (154 lb) (05/02 1623)  Weight change:  Filed Weights   10/15/14 1623  Weight: 69.854 kg (154 lb)    Intake/Output:     Intake/Output this shift:     General: critically ill Head: [place]/AT, PERRL Neck: supple, tracheal midline CVS: Regular rate and rhythm Resp: clear to auscultation ABD: soft, nontender EXT: trace-1+ edema Neuro: lethargic Access: left arm AVF with anneurysm, tender to touch   Basic Metabolic Panel:  Recent Labs Lab 10/15/14 1900  NA 133*  K >7.5*  CL 94*  CO2 20*  GLUCOSE 74  BUN 115*  CREATININE 12.58*  CALCIUM 7.7*    Liver Function Tests:  Recent Labs Lab 10/15/14 1900  AST 42*  ALT 29  ALKPHOS 131*  BILITOT 0.7  PROT 8.2*  ALBUMIN 3.3*   No results for input(s): LIPASE, AMYLASE in the last 168 hours. No results for input(s): AMMONIA in the last 168 hours.  CBC:  Recent Labs Lab 10/15/14 1900  WBC 4.6  NEUTROABS 2.4  HGB 12.5  HCT 41.5  MCV 84.4  PLT 147*    Cardiac Enzymes: No results for input(s): CKTOTAL, CKMB, CKMBINDEX, TROPONINI in the last 168 hours.  BNP: Invalid input(s): POCBNP  CBG: No results for input(s):  GLUCAP in the last 168 hours.  Microbiology: Results for orders placed or performed in visit on 05/09/14  Clostridium Difficile [facility])     Status: None   Collection Time: 05/09/14  3:00 PM  Result Value Ref Range Status   Micro Text Report   Final       C.DIFFICILE ANTIGEN       C.DIFFICILE GDH ANTIGEN : NEGATIVE   C.DIFFICILE TOXIN A/B     C.DIFFICILE TOXINS A AND B : NEGATIVE   INTERPRETATION            Negative for C. difficile.    ANTIBIOTIC                                                        Coagulation Studies: No results for input(s): LABPROT, INR in the last 72 hours.  Urinalysis: No results for input(s): COLORURINE, LABSPEC, PHURINE, GLUCOSEU, HGBUR, BILIRUBINUR, KETONESUR, PROTEINUR, UROBILINOGEN, NITRITE, LEUKOCYTESUR in the last 72 hours.  Invalid input(s): APPERANCEUR    Imaging: No results found.   Medications:   . albuterol 10 mg/hr (10/15/14 2103)  . heparin     . atropine      .  dextrose  25 g Intravenous STAT  . heparin  4,000 Units Intravenous Once  . insulin regular  10 Units Subcutaneous STAT  . nitroGLYCERIN  0.5 inch Topical Once   sodium chloride, sodium chloride  Assessment/ Plan:  54 y.o. female with malignant hypertension, CAD, history of polysubstance abuse in the past, ESRD since 05/2010, h/o R frontal lobe CVA, left frontoparietal CVA, CHF, Pulm HTN, mod to severe mitral regurg, history of pica (eating laundry starch)  1. End Stage Renal Disease with hyperkalemia: N18.6, E87.5.  Emergent hemodialysis for hemodynamic instability from hyperkalemia. Patient missed basically two treatments of hemodialysis due to AVF infiltration. Continues to be tender. - Seen on emergent hemodialysis. 1 K bath for first hour, then transition to 2 k bath.  - Next treatment for tomorrow.  - Edema on examination, Ultrafiltration goal of 3 kg. Currently at 73kg. EDW as outpatient is 72kg. However may need to be decreased.   2. Hypertension: malignant  currently. Home regimen of carvedilol 12.5bid, amlodipine 10mg  daily, and losartan 50mg  daily.   3. Secondary Hyperparathyroidism: PTH, calcium and phosphorus have been at goal.  - sensipar and sevelemer  4. Anemia of kidney disease: Hemoglobin 12.5. No indication for epo.     LOS: 0 Archita Lomeli 5/2/201611:12 PM

## 2014-10-15 NOTE — ED Notes (Signed)
2 attempt for Iv access unsuccessful.[name]

## 2014-10-16 ENCOUNTER — Inpatient Hospital Stay: Payer: Medicare Other

## 2014-10-16 DIAGNOSIS — R0789 Other chest pain: Secondary | ICD-10-CM

## 2014-10-16 LAB — RENAL FUNCTION PANEL
ALBUMIN: 2.9 g/dL — AB (ref 3.5–5.0)
ANION GAP: 17 — AB (ref 5–15)
BUN: 53 mg/dL — ABNORMAL HIGH (ref 6–20)
CALCIUM: 7.4 mg/dL — AB (ref 8.9–10.3)
CO2: 27 mmol/L (ref 22–32)
Chloride: 97 mmol/L — ABNORMAL LOW (ref 101–111)
Creatinine, Ser: 8.05 mg/dL — ABNORMAL HIGH (ref 0.44–1.00)
GFR calc Af Amer: 6 mL/min — ABNORMAL LOW (ref >60)
GFR calc non Af Amer: 5 mL/min — ABNORMAL LOW (ref >60)
Glucose, Bld: 99 mg/dL (ref 65–99)
POTASSIUM: 5.4 mmol/L — AB (ref 3.5–5.1)
Phosphorus: 5.4 mg/dL — ABNORMAL HIGH (ref 2.5–4.6)
SODIUM: 141 mmol/L (ref 135–145)

## 2014-10-16 LAB — PROTIME-INR
INR: 2.41
PROTHROMBIN TIME: 26.4 s — AB (ref 11.4–15.0)

## 2014-10-16 LAB — CBC
HCT: 37.2 % (ref 35.0–47.0)
HCT: 35.3 % (ref 35.0–47.0)
HEMOGLOBIN: 11 g/dL — AB (ref 12.0–16.0)
Hemoglobin: 11.5 g/dL — ABNORMAL LOW (ref 12.0–16.0)
MCH: 26 pg (ref 26.0–34.0)
MCH: 26 pg (ref 26.0–34.0)
MCHC: 30.8 g/dL — AB (ref 32.0–36.0)
MCHC: 31.1 g/dL — ABNORMAL LOW (ref 32.0–36.0)
MCV: 84.4 fL (ref 80.0–100.0)
MCV: 83.7 fL (ref 80.0–100.0)
Platelets: 130 10*3/uL — ABNORMAL LOW (ref 150–440)
Platelets: 137 10*3/uL — ABNORMAL LOW (ref 150–440)
RBC: 4.41 MIL/uL (ref 3.80–5.20)
RBC: 4.22 MIL/uL (ref 3.80–5.20)
RDW: 20.2 % — ABNORMAL HIGH (ref 11.5–14.5)
RDW: 19.8 % — ABNORMAL HIGH (ref 11.5–14.5)
WBC: 4 10*3/uL (ref 3.6–11.0)
WBC: 3.8 10*3/uL (ref 3.6–11.0)

## 2014-10-16 LAB — TROPONIN I
Troponin I: 0.07 ng/mL — ABNORMAL HIGH (ref <0.031)
Troponin I: 0.08 ng/mL — ABNORMAL HIGH (ref <0.031)
Troponin I: 0.06 ng/mL — ABNORMAL HIGH (ref <0.031)
Troponin I: 0.07 ng/mL — ABNORMAL HIGH (ref <0.031)
Troponin I: 0.08 ng/mL — ABNORMAL HIGH (ref <0.031)

## 2014-10-16 LAB — APTT: APTT: 34 s (ref 24–36)

## 2014-10-16 LAB — GLUCOSE, CAPILLARY: Glucose-Capillary: 89 mg/dL (ref 70–99)

## 2014-10-16 LAB — POTASSIUM
POTASSIUM: 5.5 mmol/L — AB (ref 3.5–5.1)
Potassium: 4.9 mmol/L (ref 3.5–5.1)

## 2014-10-16 MED ORDER — LOSARTAN POTASSIUM 50 MG PO TABS
50.0000 mg | ORAL_TABLET | Freq: Every day | ORAL | Status: DC
  Administered 2014-10-16 – 2014-10-17 (×2): 50 mg via ORAL
  Filled 2014-10-16 (×2): qty 1

## 2014-10-16 MED ORDER — PANTOPRAZOLE SODIUM 40 MG PO TBEC
40.0000 mg | DELAYED_RELEASE_TABLET | Freq: Every day | ORAL | Status: DC
  Administered 2014-10-16 – 2014-10-17 (×2): 40 mg via ORAL
  Filled 2014-10-16 (×2): qty 1

## 2014-10-16 MED ORDER — LIDOCAINE-PRILOCAINE 2.5-2.5 % EX CREA
1.0000 "application " | TOPICAL_CREAM | CUTANEOUS | Status: DC | PRN
  Administered 2014-10-16: 1 via TOPICAL
  Filled 2014-10-16: qty 5

## 2014-10-16 MED ORDER — ACETAMINOPHEN 650 MG RE SUPP: 650.0000 mg | Freq: Four times a day (QID) | RECTAL | Status: DC | PRN

## 2014-10-16 MED ORDER — CINACALCET HCL 30 MG PO TABS
30.0000 mg | ORAL_TABLET | Freq: Every day | ORAL | Status: DC
  Administered 2014-10-16 – 2014-10-17 (×2): 30 mg via ORAL
  Filled 2014-10-16 (×2): qty 1

## 2014-10-16 MED ORDER — AMITRIPTYLINE HCL 50 MG PO TABS
25.0000 mg | ORAL_TABLET | Freq: Every day | ORAL | Status: DC
  Administered 2014-10-16: 25 mg via ORAL
  Filled 2014-10-16 (×2): qty 1

## 2014-10-16 MED ORDER — SODIUM CHLORIDE 0.9 % IV SOLN: 100.0000 mL | INTRAVENOUS | Status: DC | PRN

## 2014-10-16 MED ORDER — ENOXAPARIN SODIUM 80 MG/0.8ML ~~LOC~~ SOLN: 1.0000 mg/kg | Freq: Two times a day (BID) | SUBCUTANEOUS | Status: DC

## 2014-10-16 MED ORDER — METOPROLOL SUCCINATE ER 50 MG PO TB24
50.0000 mg | ORAL_TABLET | Freq: Every day | ORAL | Status: DC
  Administered 2014-10-16 – 2014-10-17 (×2): 50 mg via ORAL
  Filled 2014-10-16 (×2): qty 1

## 2014-10-16 MED ORDER — HYDROXYZINE HCL 25 MG PO TABS
25.0000 mg | ORAL_TABLET | Freq: Three times a day (TID) | ORAL | Status: DC
  Administered 2014-10-16 – 2014-10-17 (×3): 25 mg via ORAL
  Filled 2014-10-16 (×3): qty 1

## 2014-10-16 MED ORDER — SENNOSIDES-DOCUSATE SODIUM 8.6-50 MG PO TABS
2.0000 | ORAL_TABLET | Freq: Every day | ORAL | Status: DC
  Administered 2014-10-16 – 2014-10-17 (×2): 2 via ORAL
  Filled 2014-10-16 (×3): qty 2

## 2014-10-16 MED ORDER — MIDODRINE HCL 5 MG PO TABS
10.0000 mg | ORAL_TABLET | Freq: Once | ORAL | Status: AC
  Administered 2014-10-16: 10 mg via ORAL

## 2014-10-16 MED ORDER — WARFARIN SODIUM 2.5 MG PO TABS
2.5000 mg | ORAL_TABLET | Freq: Every day | ORAL | Status: DC
  Administered 2014-10-16: 2.5 mg via ORAL
  Filled 2014-10-16 (×2): qty 1

## 2014-10-16 MED ORDER — DOCUSATE SODIUM 100 MG PO CAPS
100.0000 mg | ORAL_CAPSULE | Freq: Two times a day (BID) | ORAL | Status: DC
  Administered 2014-10-16 – 2014-10-17 (×2): 100 mg via ORAL
  Filled 2014-10-16 (×3): qty 1

## 2014-10-16 MED ORDER — AMIODARONE HCL 200 MG PO TABS
200.0000 mg | ORAL_TABLET | Freq: Every day | ORAL | Status: DC
  Administered 2014-10-17: 200 mg via ORAL
  Filled 2014-10-16: qty 1

## 2014-10-16 MED ORDER — ACETAMINOPHEN 325 MG PO TABS
650.0000 mg | ORAL_TABLET | Freq: Four times a day (QID) | ORAL | Status: DC | PRN
  Administered 2014-10-16: 650 mg via ORAL
  Filled 2014-10-16: qty 2

## 2014-10-16 MED ORDER — AMITRIPTYLINE HCL 50 MG PO TABS: 25.0000 mg | ORAL_TABLET | Freq: Every day | ORAL | Status: DC

## 2014-10-16 MED ORDER — MIDODRINE HCL 5 MG PO TABS: 10.0000 mg | ORAL_TABLET | ORAL | Status: DC

## 2014-10-16 MED ORDER — WARFARIN SODIUM 2.5 MG PO TABS: 2.5000 mg | ORAL_TABLET | Freq: Every day | ORAL | Status: DC

## 2014-10-16 MED ORDER — HEPARIN SODIUM (PORCINE) 5000 UNIT/ML IJ SOLN
INTRAMUSCULAR | Status: AC
  Administered 2014-10-16: 3550 [IU] via INTRAVENOUS
  Filled 2014-10-16: qty 1

## 2014-10-16 MED ORDER — ONDANSETRON HCL 4 MG/2ML IJ SOLN: 4.0000 mg | Freq: Four times a day (QID) | INTRAMUSCULAR | Status: DC | PRN

## 2014-10-16 MED ORDER — SEVELAMER CARBONATE 800 MG PO TABS
1600.0000 mg | ORAL_TABLET | Freq: Three times a day (TID) | ORAL | Status: DC
  Administered 2014-10-17 (×2): 1600 mg via ORAL
  Filled 2014-10-16 (×2): qty 2

## 2014-10-16 MED ORDER — ASPIRIN 81 MG PO CHEW
81.0000 mg | CHEWABLE_TABLET | Freq: Every day | ORAL | Status: DC
  Administered 2014-10-16 – 2014-10-17 (×2): 81 mg via ORAL
  Filled 2014-10-16 (×2): qty 1

## 2014-10-16 MED ORDER — MIDODRINE HCL 5 MG PO TABS
10.0000 mg | ORAL_TABLET | Freq: Every day | ORAL | Status: DC
  Administered 2014-10-17: 10 mg via ORAL
  Filled 2014-10-16 (×2): qty 2

## 2014-10-16 MED ORDER — ONDANSETRON HCL 4 MG PO TABS: 4.0000 mg | ORAL_TABLET | Freq: Four times a day (QID) | ORAL | Status: DC | PRN

## 2014-10-16 MED ORDER — AMIODARONE HCL 200 MG PO TABS
200.0000 mg | ORAL_TABLET | Freq: Every day | ORAL | Status: DC
  Administered 2014-10-16: 200 mg via ORAL
  Filled 2014-10-16 (×2): qty 1

## 2014-10-16 NOTE — Progress Notes (Signed)
Patient tolerated emergent treatment without complications. Blood was returned per policy; needles removed. Hemostasis achieved after 10 minutes. 2058ml fluid removed.

## 2014-10-16 NOTE — Progress Notes (Signed)
Pt still in dialysis  At this time. RN Matty aware.

## 2014-10-16 NOTE — Care Management (Signed)
Patient has freq [facility] admits with strong concern oc compliance issues with her hemodialysis regimen.  She presents with K of 7.5 requiring emergent dialysis.  She admits to missing some treatment session because "she did not feel well."  Notified dialysis coordinator of admission.  Patient also with positive troponin and was on heparin drip until IV access was lost.

## 2014-10-16 NOTE — Progress Notes (Signed)
ANTICOAGULATION CONSULT NOTE - Initial Consult  Pharmacy Consult for warfarin Indication: unkown  Allergies  Allergen Reactions  . Morphine And Related Hives  . Adhesive [Tape] Rash  . Latex Rash    Patient Measurements: Height: 5\' 6"  (167.6 cm) Weight: 155 lb 3.3 oz (70.4 kg) IBW/kg (Calculated) : 59.3   Vital Signs: Temp: 98.3 F (36.8 C) (05/03 1523) Temp Source: Oral (05/03 1523) BP: 120/83 mmHg (05/03 1631) Pulse Rate: 81 (05/03 1530)  Labs:  Recent Labs  10/15/14 1602  10/15/14 1900 10/16/14 0035  10/16/14 0625 10/16/14 0626 10/16/14 0838 10/16/14 1333  HGB  --   < > 12.5  --   --  11.5*  --   --  11.0*  HCT  --   --  41.5  --   --  37.2  --   --  35.3  PLT  --   --  147*  --   --  130*  --   --  137*  APTT  --   --   --  34  --   --   --   --   --   LABPROT 26.4*  --   --   --   --   --   --   --   --   INR 2.41  --   --   --   --   --   --   --   --   CREATININE  --   --  12.58*  --   --   --   --   --  8.05*  TROPONINI  --   --  0.07*  --   < >  --  0.06* 0.07* 0.08*  < > = values in this interval not displayed.  Estimated Creatinine Clearance: 7.7 mL/min (by C-G formula based on Cr of 8.05).   Medical History: Past Medical History  Diagnosis Date  . Heart failure   . Renal failure   . Hypertension   . Myocardial infarction   . Hyperlipidemia   . Stroke 2011    Medications:  Scheduled:  . amiodarone  200 mg Oral Daily  . amitriptyline  25 mg Oral QHS  . aspirin  81 mg Oral Daily  . cinacalcet  30 mg Oral Daily  . dextrose  25 g Intravenous STAT  . docusate sodium  100 mg Oral BID  . hydrOXYzine  25 mg Oral TID  . insulin regular  10 Units Subcutaneous STAT  . losartan  50 mg Oral Daily  . metoprolol succinate  50 mg Oral Daily  . midodrine  10 mg Oral 1 day or 1 dose  . nitroGLYCERIN  0.5 inch Topical Once  . pantoprazole  40 mg Oral Daily  . senna-docusate  2 tablet Oral Daily  . [START ON 10/17/2014] sevelamer carbonate  1,600 mg  Oral TID WC  . warfarin  2.5 mg Oral q1800    Assessment: Warfarin 2.5 mg daily  PTA admission for unkown indication. Goal of Therapy:  INR 2-3    Plan:  Baseline PT/INR ordered. Will continue warfarin 2.5 mg daily as ordered for now pending INR results.  Ulice Dash D 10/16/2014,7:02 PM

## 2014-10-16 NOTE — Progress Notes (Signed)
Pt. admitted to unit. Oriented to room, call bell, Ascom phones and staff. Bed in low position. Fall safety plan reviewed. Full assessment to Epic. Will continue to monitor.   

## 2014-10-16 NOTE — Progress Notes (Signed)
Patients BP low, recheck done, BP still 80'/40's.  MD made aware, ordered to give 10 mg Midodrine once.  Pt on dialysis, no bolus, gets midodrine before HD.

## 2014-10-16 NOTE — Progress Notes (Signed)
Patient ID: [name], female   DOB: 1960/08/18, 54 y.o.   MRN: [id number]  Patient still having chest pain Lung now clear after dialysis Re-eval tomorrow for discharge

## 2014-10-16 NOTE — Progress Notes (Signed)
Spoke to Dr. Earleen Newport about patient positive troponins and heparin drip on hold-Patient has no IV access.  Dr. Earleen Newport aware- no changes. MD stated patient ok with no IV access.

## 2014-10-16 NOTE — Progress Notes (Signed)
Patient ID: [name], female   DOB: 04-26-1961, 54 y.o.   MRN: [id number] [name] PROGRESS NOTE  [name] [id number] DOB: 04-10-1961 DOA: 10/15/2014 PCP: Vanessa Noble, MD  HPI/Subjective:   Objective: Filed Vitals:   10/16/14 0700  BP:   Pulse:   Temp: 97.6 F (36.4 C)  Resp:     Intake/Output Summary (Last 24 hours) at 10/16/14 1047 Last data filed at 10/16/14 0330  Gross per 24 hour  Intake 121.82 ml  Output   2000 ml  Net -1878.18 ml   Filed Weights   10/15/14 2245 10/16/14 0215 10/16/14 0525  Weight: 73.5 kg (162 lb 0.6 oz) 72.1 kg (158 lb 15.2 oz) 73.4 kg (161 lb 13.1 oz)    ROS: Review of Systems  Constitutional: Negative for fever and chills.  Eyes: Negative for blurred vision.  Respiratory: Positive for cough and shortness of breath.   Cardiovascular: Positive for chest pain.  Gastrointestinal: Negative for nausea, vomiting, diarrhea and constipation.  Genitourinary: Negative for dysuria.  Musculoskeletal: Negative for joint pain.  Neurological: Negative for dizziness and headaches.   Exam: Physical Exam  HENT:  Nose: No mucosal edema.  Mouth/Throat: No oropharyngeal exudate or posterior oropharyngeal edema.  Eyes: Conjunctivae, EOM and lids are normal. Pupils are equal, round, and reactive to light.  Neck: No JVD present. Carotid bruit is not present. No edema present. No thyroid mass and no thyromegaly present.  Cardiovascular: S1 normal and S2 normal.  Exam reveals no gallop.   No murmur heard. Pulses:      Dorsalis pedis pulses are 1+ on the right side, and 1+ on the left side.  Respiratory: No respiratory distress. She has decreased breath sounds in the right upper field, the right middle field, the right lower field, the left middle field and the left lower field. She has no wheezes. She has no rhonchi. She has rales in the right lower field and the left lower field.  GI: Soft. Bowel sounds are normal. There is  no tenderness.  Lymphadenopathy:    She has no cervical adenopathy.  Neurological: She is alert. No cranial nerve deficit.  Skin: Skin is warm. No rash noted. Nails show no clubbing.  Psychiatric: She has a normal mood and affect.    Data Reviewed: Basic Metabolic Panel:  Recent Labs Lab 10/15/14 1900 10/16/14 0838  NA 133*  --   K >7.5* 4.9  CL 94*  --   CO2 20*  --   GLUCOSE 74  --   BUN 115*  --   CREATININE 12.58*  --   CALCIUM 7.7*  --    Liver Function Tests:  Recent Labs Lab 10/15/14 1900  AST 42*  ALT 29  ALKPHOS 131*  BILITOT 0.7  PROT 8.2*  ALBUMIN 3.3*   CBC:  Recent Labs Lab 10/15/14 1900 10/16/14 0625  WBC 4.6 4.0  NEUTROABS 2.4  --   HGB 12.5 11.5*  HCT 41.5 37.2  MCV 84.4 84.4  PLT 147* 130*   Cardiac Enzymes:  Recent Labs Lab 10/15/14 1900 10/16/14 0209 10/16/14 0626 10/16/14 0838  TROPONINI 0.07* 0.08* 0.06* 0.07*        Assessment/Plan: Principal Problem:   Hyperkalemia, diminished renal excretion Active Problems:   Generalized weakness   Chest pain   ESRD on hemodialysis   Congestive heart failure (CHF)   HTN (hypertension)   1. Chest pain and shortness of breath. I spoke with Dr. Rolly [name] nephrology, he  will take off more fluid today with dialysis. I believe this is fluid overload. Will reassess after dialysis. 2. Elevated troponin. Likely false positive with end-stage renal disease. Stop heparin drip. 3. Severe hyperkalemia and end-stage renal disease patient. She skipped dialysis on Saturday secondary to swelling and bruising in her arm. I think this was the issue. Patient feels better today than she did yesterday. 4.   Essential hypertension on metoprolol. 5.   Weakness likely from electrolyte abnormalities.    Code Status:     Code Status Orders        Start     Ordered   10/16/14 0824  Full code   Continuous     10/16/14 0823     Disposition Plan: Home  Time spent: 30 minutes    [name]  [name]

## 2014-10-16 NOTE — Progress Notes (Signed)
[facility]  ROUNDING NOTE   Subjective:   Emergent hemodialysis yesterday night for potassium of 7.5 Now back on hemodialysis for volume overload.  Seen and examined on hemodialysis. Tolerating treatment well. UF goal 2.5 litre  Objective:  Vital signs in last 24 hours:  Temp:  [97.6 F (36.4 C)-98.6 F (37 C)] 97.6 F (36.4 C) (05/03 0700) Pulse Rate:  [56-106] 87 (05/03 0500) Resp:  [10-24] 24 (05/03 0500) BP: (90-124)/(50-100) 100/61 mmHg (05/03 0500) SpO2:  [92 %-100 %] 98 % (05/03 0500) Weight:  [69.854 kg (154 lb)-73.5 kg (162 lb 0.6 oz)] 73.4 kg (161 lb 13.1 oz) (05/03 0525)  Weight change:  Filed Weights   10/15/14 2245 10/16/14 0215 10/16/14 0525  Weight: 73.5 kg (162 lb 0.6 oz) 72.1 kg (158 lb 15.2 oz) 73.4 kg (161 lb 13.1 oz)    Intake/Output: I/O last 3 completed shifts: In: 121.8 [P.O.:100; I.V.:21.8] Out: 2000 [Other:2000]   Intake/Output this shift:     General: NAD Head: Bailey/AT, PERRL Neck: supple, tracheal midline CVS: Regular rate and rhythm Resp: clear to auscultation ABD: soft, nontender EXT: trace-1+ edema Neuro: lethargic Access: left arm AVF with anneurysm, tender to touch   Basic Metabolic Panel:  Recent Labs Lab 10/15/14 1900 10/16/14 0838  NA 133*  --   K >7.5* 4.9  CL 94*  --   CO2 20*  --   GLUCOSE 74  --   BUN 115*  --   CREATININE 12.58*  --   CALCIUM 7.7*  --     Liver Function Tests:  Recent Labs Lab 10/15/14 1900  AST 42*  ALT 29  ALKPHOS 131*  BILITOT 0.7  PROT 8.2*  ALBUMIN 3.3*   No results for input(s): LIPASE, AMYLASE in the last 168 hours. No results for input(s): AMMONIA in the last 168 hours.  CBC:  Recent Labs Lab 10/15/14 1900 10/16/14 0625  WBC 4.6 4.0  NEUTROABS 2.4  --   HGB 12.5 11.5*  HCT 41.5 37.2  MCV 84.4 84.4  PLT 147* 130*    Cardiac Enzymes:  Recent Labs Lab 10/15/14 1900 10/16/14 0209 10/16/14 0626 10/16/14 0838  TROPONINI 0.07* 0.08* 0.06* 0.07*     BNP: Invalid input(s): POCBNP  CBG:  Recent Labs Lab 10/15/14 2250  GLUCAP 16    Microbiology: Results for orders placed or performed in visit on 05/09/14  Clostridium Difficile [facility])     Status: None   Collection Time: 05/09/14  3:00 PM  Result Value Ref Range Status   Micro Text Report   Final       C.DIFFICILE ANTIGEN       C.DIFFICILE GDH ANTIGEN : NEGATIVE   C.DIFFICILE TOXIN A/B     C.DIFFICILE TOXINS A AND B : NEGATIVE   INTERPRETATION            Negative for C. difficile.    ANTIBIOTIC                                                        Coagulation Studies:  Recent Labs  10/15/14 1602  LABPROT 26.4*  INR 2.41    Urinalysis: No results for input(s): COLORURINE, LABSPEC, PHURINE, GLUCOSEU, HGBUR, BILIRUBINUR, KETONESUR, PROTEINUR, UROBILINOGEN, NITRITE, LEUKOCYTESUR in the last 72 hours.  Invalid input(s): APPERANCEUR    Imaging: No  results found.   Medications:   . albuterol 10 mg/hr (10/15/14 2103)   . amiodarone  200 mg Oral Daily  . amitriptyline  25 mg Oral QHS  . aspirin  81 mg Oral Daily  . cinacalcet  30 mg Oral Daily  . dextrose  25 g Intravenous STAT  . insulin regular  10 Units Subcutaneous STAT  . losartan  50 mg Oral Daily  . metoprolol succinate  50 mg Oral Daily  . nitroGLYCERIN  0.5 inch Topical Once  . pantoprazole  40 mg Oral Daily  . warfarin  2.5 mg Oral q1800   sodium chloride, sodium chloride, sodium chloride, sodium chloride, acetaminophen **OR** acetaminophen, lidocaine-prilocaine, ondansetron **OR** ondansetron (ZOFRAN) IV  Assessment/ Plan:  54 y.o. female with malignant hypertension, CAD, history of polysubstance abuse in the past, ESRD since 05/2010, h/o R frontal lobe CVA, left frontoparietal CVA, CHF, Pulm HTN, mod to severe mitral regurg, history of pica (eating laundry starch)  1. End Stage Renal Disease with hyperkalemia: N18.6, E87.5.  Emergent hemodialysis for hemodynamic instability from  hyperkalemia.  - now on hemodialysis. Tolerating treatment today.   - Edema on examination, Ultrafiltration goal of 2.5 kg. Currently at 73kg. EDW as outpatient is 72kg. However may need to be decreased.   2. Hypertension: now well controlled.  Home regimen of carvedilol 12.5bid, amlodipine 10mg  daily, and losartan 50mg  daily.   3. Secondary Hyperparathyroidism: PTH, calcium and phosphorus have been at goal.  - sensipar and sevelemer  4. Anemia of kidney disease: Hemoglobin 12.5. No indication for epo.     LOS: 1 Clif Serio 5/3/201612:15 PM

## 2014-10-16 NOTE — Progress Notes (Signed)
Update:  Pt lost IV access, was on heparin gtt for presumed ACS given prior CP and positive troponin.  Can not do SubQ lovenox due to renal function.  Will continue trying to establish access to restart heparin drip at that time.  Continue to trend troponin.

## 2014-10-16 NOTE — Progress Notes (Addendum)
Patient back from dialysis and transferred to room 245.

## 2014-10-17 LAB — PROTIME-INR
INR: 1.96
Prothrombin Time: 22.5 seconds — ABNORMAL HIGH (ref 11.4–15.0)

## 2014-10-17 LAB — HEPATITIS B CORE ANTIBODY, TOTAL: Hep B Core Total Ab: NEGATIVE

## 2014-10-17 MED ORDER — SODIUM CHLORIDE 0.9 % IV SOLN: 100.0000 mL | INTRAVENOUS | Status: DC | PRN

## 2014-10-17 NOTE — Progress Notes (Signed)
ANTICOAGULATION CONSULT NOTE - Follow Up Consult  Pharmacy Consult for warfarin  Indication: atrial fibrillation  Allergies  Allergen Reactions  . Morphine And Related Hives  . Adhesive [Tape] Rash  . Latex Rash    Patient Measurements: Height: 5\' 6"  (167.6 cm) Weight: 166 lb 1.6 oz (75.342 kg) IBW/kg (Calculated) : 59.3  Vital Signs: Temp: 98.2 F (36.8 C) (05/04 0548) Temp Source: Oral (05/04 0548) BP: 109/67 mmHg (05/04 0548)  Labs:  Recent Labs  10/15/14 1602  10/15/14 1900 10/16/14 0035  10/16/14 [id number] 10/16/14 [id number] 10/16/14 [id number] 10/16/14 1333 10/17/14 0446  HGB  --   < > 12.5  --   --  11.5*  --   --  11.0*  --   HCT  --   --  41.5  --   --  37.2  --   --  35.3  --   PLT  --   --  147*  --   --  130*  --   --  137*  --   APTT  --   --   --  34  --   --   --   --   --   --   LABPROT 26.4*  --   --   --   --   --   --   --   --  22.5*  INR 2.41  --   --   --   --   --   --   --   --  1.96  CREATININE  --   --  12.58*  --   --   --   --   --  8.05*  --   TROPONINI  --   --  0.07*  --   < >  --  0.06* 0.07* 0.08*  --   < > = values in this interval not displayed.  Estimated Creatinine Clearance: 8.6 mL/min (by C-G formula based on Cr of 8.05).   Medications:  Scheduled:  . amiodarone  200 mg Oral Daily  . amitriptyline  25 mg Oral QHS  . aspirin  81 mg Oral Daily  . cinacalcet  30 mg Oral Daily  . docusate sodium  100 mg Oral BID  . hydrOXYzine  25 mg Oral TID  . losartan  50 mg Oral Daily  . metoprolol succinate  50 mg Oral Daily  . midodrine  10 mg Oral Q breakfast  . nitroGLYCERIN  0.5 inch Topical Once  . pantoprazole  40 mg Oral Daily  . senna-docusate  2 tablet Oral Daily  . sevelamer carbonate  1,600 mg Oral TID WC  . warfarin  2.5 mg Oral q1800    Assessment: Patient is a 54 yo female admitted for chest pain.  Patient started on Heparin drip at admission.   Patient lose IV access on 5/3.  MD gave pharmacist order to resume warfarin as  patient could no longer receive Heparin Drip IV.   Previously on warfarin as an outpatient.  PTA dose of warfarin 2.5 mg daily.  INR on 5/2 of 2.41. INR today of 1.96.  Patient received 1 dose of warfarin 2.5 mg on 5/3.  Goal of Therapy:  INR 2-3    Plan:  INR slightly below therapeutic range at 1.96.Continue home dosing of warfarin 2.5 mg po daily as it make take 36-72 hours to see warfarin's effects on the INR.  Will recheck INR in AM. Continue to follow Hgb.  Pharmacy will continue to follow.  Murrell Converse, PharmD Clinical Pharmacist 10/17/2014

## 2014-10-17 NOTE — Consult Note (Signed)
Cardiology Consultation Note  Patient ID: [name], MRN: [id number], DOB/AGE: 12-04-1960 54 y.o. Admit date: 10/15/2014   Date of Consult: 10/17/2014 Primary Physician: Marden Noble, MD Primary Cardiologist:    Chief Complaint: SOB Reason for Consult: CHF  HPI: 54 y.o. female with h/o endstage renal disease who is on hemodialysis 3 days a week who was admitted with sob after missing her dialysis appt. She had an infiltrate in her left upper forearm. She states she has been compliant with her meds. She had a mild troponin elevation. She denied chest pain. She underwent hd yesterday and is back to her baseline .  She was noted to have potassium of 7.5. She complained of weakness and fatigue.   Past Medical History  Diagnosis Date  . Heart failure   . Renal failure   . Hypertension   . Myocardial infarction   . Hyperlipidemia   . Stroke 2011      Most Recent Cardiac Studies:    Surgical History:  Past Surgical History  Procedure Laterality Date  . Dialysis fistula creation    . Coronary angioplasty with stent placement  2013     Home Meds: Prior to Admission medications   Medication Sig Start Date End Date Taking? Authorizing Provider  amiodarone (PACERONE) 200 MG tablet Take 200 mg by mouth daily.   Yes Historical Provider, MD  amitriptyline (ELAVIL) 25 MG tablet Take 25 mg by mouth at bedtime.   Yes Historical Provider, MD  aspirin 81 MG tablet Take 81 mg by mouth daily.   Yes Historical Provider, MD  docusate sodium (COLACE) 100 MG capsule Take 100 mg by mouth 2 (two) times daily.   Yes Historical Provider, MD  hydrOXYzine (ATARAX/VISTARIL) 25 MG tablet Take 1 tablet by mouth 3 (three) times daily. 05/18/13  Yes Historical Provider, MD  midodrine (PROAMATINE) 10 MG tablet Take 10 mg by mouth 1 day or 1 dose.   Yes Historical Provider, MD  pantoprazole (PROTONIX) 40 MG tablet Take 1 tablet by mouth daily. 05/07/13  Yes Historical Provider, MD  sennosides-docusate  sodium (SENOKOT-S) 8.6-50 MG tablet Take 2 tablets by mouth daily.   Yes Historical Provider, MD  SENSIPAR 30 MG tablet Take 1 tablet by mouth daily. 05/10/13  Yes Historical Provider, MD  sevelamer carbonate (RENVELA) 800 MG tablet Take 1,600 mg by mouth 3 (three) times daily with meals.   Yes Historical Provider, MD  warfarin (COUMADIN) 2.5 MG tablet Take 2.5 mg by mouth daily.   Yes Historical Provider, MD    Inpatient Medications:  . amiodarone  200 mg Oral Daily  . amitriptyline  25 mg Oral QHS  . aspirin  81 mg Oral Daily  . cinacalcet  30 mg Oral Daily  . docusate sodium  100 mg Oral BID  . hydrOXYzine  25 mg Oral TID  . losartan  50 mg Oral Daily  . metoprolol succinate  50 mg Oral Daily  . midodrine  10 mg Oral Q breakfast  . nitroGLYCERIN  0.5 inch Topical Once  . pantoprazole  40 mg Oral Daily  . senna-docusate  2 tablet Oral Daily  . sevelamer carbonate  1,600 mg Oral TID WC  . warfarin  2.5 mg Oral q1800   . albuterol 10 mg/hr (10/15/14 2103)    Allergies:  Allergies  Allergen Reactions  . Morphine And Related Hives  . Adhesive [Tape] Rash  . Latex Rash    History   Social History  .  Marital Status: Single    Spouse Name: N/A  . Number of Children: N/A  . Years of Education: N/A   Occupational History  . Not on file.   Social History Main Topics  . Smoking status: Former Smoker -- 20 years    Types: Cigarettes  . Smokeless tobacco: Not on file  . Alcohol Use: No  . Drug Use: No  . Sexual Activity: Not Currently   Other Topics Concern  . Not on file   Social History Narrative     Family History  Problem Relation Age of Onset  . Hypertension    . Cancer    . Renal Disease       Review of Systems:lower extremety edema, swellling in her left upper arm, weakness and fatigue General: negative for chills, fever, night sweats or weight changes.  Cardiovascular: negative for chest pain, edema, orthopnea, palpitations, paroxysmal nocturnal  dyspnea,  Dermatological: negative for rash Respiratory: negative for cough or wheezing Urologic: negative for hematuria Abdominal: negative for nausea, vomiting, diarrhea, bright red blood per rectum, melena, or hematemesis Neurologic: negative for visual changes, syncope, or dizziness All other systems reviewed and are otherwise negative except as noted above.  Labs:  Recent Labs  10/16/14 0209 10/16/14 0626 10/16/14 0838 10/16/14 1333  TROPONINI 0.08* 0.06* 0.07* 0.08*   Lab Results  Component Value Date   WBC 3.8 10/16/2014   HGB 11.0* 10/16/2014   HCT 35.3 10/16/2014   MCV 83.7 10/16/2014   PLT 137* 10/16/2014    Recent Labs Lab 10/15/14 1900  10/16/14 1333  NA 133*  --  141  K >7.5*  < > 5.4*  5.5*  CL 94*  --  97*  CO2 20*  --  27  BUN 115*  --  53*  CREATININE 12.58*  --  8.05*  CALCIUM 7.7*  --  7.4*  PROT 8.2*  --   --   BILITOT 0.7  --   --   ALKPHOS 131*  --   --   ALT 29  --   --   AST 42*  --   --   GLUCOSE 74  --  99  < > = values in this interval not displayed. No results found for: CHOL, HDL, LDLCALC, TRIG No results found for: DDIMER  Radiology/Studies:  No results found.  EKG: nsr with no ischemia  Weights: Filed Weights   10/16/14 1249 10/16/14 1530 10/17/14 0548  Weight: 73.7 kg (162 lb 7.7 oz) 70.4 kg (155 lb 3.3 oz) 75.342 kg (166 lb 1.6 oz)     Physical Exam: Blood pressure 109/67, pulse 43, temperature 98.2 F (36.8 C), temperature source Oral, resp. rate 20, height 5\' 6"  (1.676 m), weight 75.342 kg (166 lb 1.6 oz), SpO2 96 %. Body mass index is 26.82 kg/(m^2). General: Well developed, well nourished, in no acute distress. Head: Normocephalic, atraumatic, sclera non-icteric, no xanthomas, nares are without discharge.  Neck: Negative for carotid bruits. JVD not elevated. Lungs: Scattered rhonchi and fine rales bilaterally to auscultation Breathing is unlabored. Heart: RRR with S1 S2. No murmurs, rubs, or gallops  appreciated. Abdomen: Soft, non-tender, non-distended with normoactive bowel sounds. No hepatomegaly. No rebound/guarding. No obvious abdominal masses. Msk:  Strength and tone appear normal for age. Extremities: No clubbing or cyanosis. No edema.  Distal pedal pulses are 2+ and equal bilaterally. Neuro: Alert and oriented X 3. No facial asymmetry. No focal deficit. Moves all extremities spontaneously. Psych:  Responds to questions appropriately with  a normal affect.    Assessment and Plan: Pt with ESRD on HD who missed a dialysis secondary to having an infiltrate in her left upper arm She presented with weakness and fatiuge and sob. She was hyperkalemic and in mild chf. She improved with HD. Elevated troponin is secondary to demand and renal failure. Does not have an acute coronary event. Continue with previous meds and HD 3 times a week. Does not require invasive cardiac evaluation at present.   Signed, Javier Docker Lovelee Forner MD [facility]   10/17/2014, 7:11 AM

## 2014-10-17 NOTE — Progress Notes (Signed)
Received verbal order from attending to resume all home health services

## 2014-10-17 NOTE — Discharge Instructions (Signed)
Hyperkalemia Hyperkalemia means you have too much potassium in your blood. Potassium is a type of salt in the blood (electrolyte). Normally, your kidneys remove potassium from the body. Too much potassium can be life-threatening. HOME CARE  Only take medicine as told by your doctor.  Do not take vitamins or natural products unless your doctor says they are okay.  Keep all doctor visits as told.  Follow diet instructions as told by your doctor. GET HELP RIGHT AWAY IF:  Your heartbeat is not regular or very slow.  You feel dizzy (lightheaded).  You feel weak.  You are short of breath.  You have chest pain.  You pass out (faint). MAKE SURE YOU:   Understand these instructions.  Will watch your condition.  Will get help right away if you are not doing well or get worse. Document Released: 06/01/2005 Document Revised: 08/24/2011 Document Reviewed: 09/06/2013 [facility] Patient Information 2015 [place], [place]. This information is not intended to replace advice given to you by your health care provider. Make sure you discuss any questions you have with your health care provider.

## 2014-10-17 NOTE — Progress Notes (Signed)
[facility]  ROUNDING NOTE   Subjective:   Patient laying in bed. No complaints.  Hemodialysis yesterday. Tolerated treatment well. Two days in a row. UF of 2 litres.  Objective:  Vital signs in last 24 hours:  Temp:  [98.2 F (36.8 C)-98.6 F (37 C)] 98.2 F (36.8 C) (05/04 0548) Pulse Rate:  [43-81] 43 (05/03 1924) Resp:  [16-20] 20 (05/04 0548) BP: (88-127)/(48-92) 109/67 mmHg (05/04 0548) SpO2:  [92 %-100 %] 96 % (05/04 0745) Weight:  [70.4 kg (155 lb 3.3 oz)-75.342 kg (166 lb 1.6 oz)] 75.342 kg (166 lb 1.6 oz) (05/04 0548)  Weight change: 3.846 kg (8 lb 7.7 oz) Filed Weights   10/16/14 1249 10/16/14 1530 10/17/14 0548  Weight: 73.7 kg (162 lb 7.7 oz) 70.4 kg (155 lb 3.3 oz) 75.342 kg (166 lb 1.6 oz)    Intake/Output: I/O last 3 completed shifts: In: 181.8 [P.O.:160; I.V.:21.8] Out: 4000 [Other:4000]   Intake/Output this shift:  Total I/O In: 240 [P.O.:240] Out: 0   General: NAD Head: [place]/AT, PERRL Neck: supple, tracheal midline CVS: Regular rate and rhythm Resp: clear to auscultation ABD: soft, nontender EXT: no edema Neuro: lethargic Access: left arm AVF with anneurysm, tender to touch   Basic Metabolic Panel:  Recent Labs Lab 10/15/14 1900 10/16/14 0838 10/16/14 1333  NA 133*  --  141  K >7.5* 4.9 5.4*  5.5*  CL 94*  --  97*  CO2 20*  --  27  GLUCOSE 74  --  99  BUN 115*  --  53*  CREATININE 12.58*  --  8.05*  CALCIUM 7.7*  --  7.4*  PHOS  --   --  5.4*    Liver Function Tests:  Recent Labs Lab 10/15/14 1900 10/16/14 1333  AST 42*  --   ALT 29  --   ALKPHOS 131*  --   BILITOT 0.7  --   PROT 8.2*  --   ALBUMIN 3.3* 2.9*   No results for input(s): LIPASE, AMYLASE in the last 168 hours. No results for input(s): AMMONIA in the last 168 hours.  CBC:  Recent Labs Lab 10/15/14 1900 10/16/14 0625 10/16/14 1333  WBC 4.6 4.0 3.8  NEUTROABS 2.4  --   --   HGB 12.5 11.5* 11.0*  HCT 41.5 37.2 35.3  MCV 84.4 84.4 83.7   PLT 147* 130* 137*    Cardiac Enzymes:  Recent Labs Lab 10/15/14 1900 10/16/14 0209 10/16/14 0626 10/16/14 0838 10/16/14 1333  TROPONINI 0.07* 0.08* 0.06* 0.07* 0.08*    BNP: Invalid input(s): POCBNP  CBG:  Recent Labs Lab 10/15/14 2250  GLUCAP 56    Microbiology: Results for orders placed or performed in visit on 05/09/14  Clostridium Difficile [facility])     Status: None   Collection Time: 05/09/14  3:00 PM  Result Value Ref Range Status   Micro Text Report   Final       C.DIFFICILE ANTIGEN       C.DIFFICILE GDH ANTIGEN : NEGATIVE   C.DIFFICILE TOXIN A/B     C.DIFFICILE TOXINS A AND B : NEGATIVE   INTERPRETATION            Negative for C. difficile.    ANTIBIOTIC  Coagulation Studies:  Recent Labs  10/15/14 1602 10/17/14 0446  LABPROT 26.4* 22.5*  INR 2.41 1.96    Urinalysis: No results for input(s): COLORURINE, LABSPEC, PHURINE, GLUCOSEU, HGBUR, BILIRUBINUR, KETONESUR, PROTEINUR, UROBILINOGEN, NITRITE, LEUKOCYTESUR in the last 72 hours.  Invalid input(s): APPERANCEUR    Imaging: No results found.   Medications:   . albuterol 10 mg/hr (10/15/14 2103)   . amiodarone  200 mg Oral Daily  . amitriptyline  25 mg Oral QHS  . aspirin  81 mg Oral Daily  . cinacalcet  30 mg Oral Daily  . docusate sodium  100 mg Oral BID  . hydrOXYzine  25 mg Oral TID  . losartan  50 mg Oral Daily  . metoprolol succinate  50 mg Oral Daily  . midodrine  10 mg Oral Q breakfast  . nitroGLYCERIN  0.5 inch Topical Once  . pantoprazole  40 mg Oral Daily  . senna-docusate  2 tablet Oral Daily  . sevelamer carbonate  1,600 mg Oral TID WC  . warfarin  2.5 mg Oral q1800   sodium chloride, sodium chloride, sodium chloride, sodium chloride, acetaminophen **OR** acetaminophen, lidocaine-prilocaine, ondansetron **OR** ondansetron (ZOFRAN) IV  Assessment/ Plan:  54 y.o. female with malignant hypertension, CAD, history of  polysubstance abuse in the past, ESRD since 05/2010, h/o R frontal lobe CVA, left frontoparietal CVA, CHF, Pulm HTN, mod to severe mitral regurg, history of pica (eating laundry starch)  1. End Stage Renal Disease with hyperkalemia: N18.6, E87.5.  Emergent hemodialysis for hemodynamic instability from hyperkalemia.  - Tolerated two treatments, Monday and Tuesday. Plan for next treatment tomorrow.   2. Hypertension: now well controlled.  Currently on losartan, metoprolol. Gets midodrine before treatments.  Home regimen of carvedilol 12.5bid, amlodipine 10mg  daily, and losartan 50mg  daily.   3. Secondary Hyperparathyroidism: PTH, calcium and phosphorus have been at goal.  - sensipar and sevelemer  4. Anemia of kidney disease: Hemoglobin 12.5. No indication for epo.     LOS: 2 [name] 5/4/201610:25 AM

## 2014-10-17 NOTE — Progress Notes (Signed)
Pt was discharged from floor.  All lines were roomed prior to shift, telemetry box had been removed prior as well.  All questions answered.  Brother came to pick up patient, all his questions were answered, discharge paper work sent out with patient.  Pt wheeled off floor.  All needs met.

## 2014-10-17 NOTE — Discharge Summary (Signed)
Physician Discharge Summary  [name] [id number] DOB: 03/23/1961 DOA: 10/15/2014  PCP: Marden Noble, MD  Admit date: 10/15/2014 Discharge date: 10/17/2014  Time spent: 50 minutes  Recommendations for Outpatient Follow-up:  1. Dr. Brynda Greathouse 2 weeks 2. [facility] on [address]  Discharge Diagnoses:  Principal Problem:   Hyperkalemia, diminished renal excretion Active Problems:   Generalized weakness   Chest pain   ESRD on hemodialysis   Congestive heart failure (CHF)   HTN (hypertension)   Discharge Condition: Satisfactory  Diet recommendation: Renal diet  Filed Weights   10/16/14 1249 10/16/14 1530 10/17/14 0548  Weight: 73.7 kg (162 lb 7.7 oz) 70.4 kg (155 lb 3.3 oz) 75.342 kg (166 lb 1.6 oz)    History of present illness:  Patient presented with weakness.  Hospital Course:  #1 severe hyperkalemia and weakness -The patient required urgent dialysis to control potassium. Patient received short acting meds prior to dialysis. The patient received 2 dialysis during the hospital course. Potassium now acceptable range for discharge. Missing dialysis on Saturday was probably the culprit. #2 fluid overload. Acute on chronic combined systolic and diastolic congestive heart failure. -Since the patient does not urinate I cannot give Lasix. Dialysis is the only way to remove fluid. -I spoke with nephrology to reduce the patient's dry weight and remove fluid. -We are limited with medications secondary to relative hypotension but the patient is on a beta blocker and ARB. #3 chest pain and borderline elevated troponin. -Troponins only borderline, I believe this is a false positive secondary to end-stage renal disease -Seen in consultation by Dr. Ubaldo Glassing no further cardiac workup. #4 end-stage renal disease -I told the patient she must follow up for dialysis every time she is scheduled. This is to avoid hyperkalemia and fluid overload. She was advised to go to  dialysis tomorrow. #5 chronic respiratory failure -She is on 2 L of nasal cannula daily. #6. Arrhythmia history. Patient is anticoagulated with Coumadin. Patient is on amiodarone.  Consultations:  Dr. Ubaldo Glassing cardiology  Dr. Rolly Salter nephrology  Discharge Exam: Filed Vitals:   10/17/14 0548  BP: 109/67  Pulse:   Temp: 98.2 F (36.8 C)  Resp: 20    Physical Exam  HENT:  Nose: No mucosal edema.  Mouth/Throat: No oropharyngeal exudate or posterior oropharyngeal edema.  Eyes: Conjunctivae, EOM and lids are normal. Pupils are equal, round, and reactive to light.  Neck: No JVD present. Carotid bruit is not present. No edema present. No thyroid mass and no thyromegaly present.  Cardiovascular: S1 normal and S2 normal.  Exam reveals no gallop.   No murmur heard. Pulses:      Dorsalis pedis pulses are 2+ on the right side, and 2+ on the left side.  Respiratory: No respiratory distress. She has no wheezes. She has no rhonchi. She has rales in the right lower field and the left lower field.  GI: Soft. Bowel sounds are normal. There is no tenderness.  Lymphadenopathy:    She has no cervical adenopathy.  Neurological: She is alert. No cranial nerve deficit.  Skin: Skin is warm. No rash noted. Nails show no clubbing.  Psychiatric: She has a normal mood and affect.    Discharge Instructions    Current Discharge Medication List    CONTINUE these medications which have NOT CHANGED   Details  amiodarone (PACERONE) 200 MG tablet Take 200 mg by mouth daily.    amitriptyline (ELAVIL) 25 MG tablet Take 25 mg by mouth  at bedtime.    aspirin 81 MG tablet Take 81 mg by mouth daily.    docusate sodium (COLACE) 100 MG capsule Take 100 mg by mouth 2 (two) times daily.    hydrOXYzine (ATARAX/VISTARIL) 25 MG tablet Take 1 tablet by mouth 3 (three) times daily.    midodrine (PROAMATINE) 10 MG tablet Take 10 mg by mouth 1 day or 1 dose.    pantoprazole (PROTONIX) 40 MG tablet Take 1 tablet by  mouth daily.    sennosides-docusate sodium (SENOKOT-S) 8.6-50 MG tablet Take 2 tablets by mouth daily.    SENSIPAR 30 MG tablet Take 1 tablet by mouth daily.    sevelamer carbonate (RENVELA) 800 MG tablet Take 1,600 mg by mouth 3 (three) times daily with meals.    warfarin (COUMADIN) 2.5 MG tablet Take 2.5 mg by mouth daily.      STOP taking these medications     losartan (COZAAR) 50 MG tablet      metoprolol succinate (TOPROL-XL) 50 MG 24 hr tablet        Allergies  Allergen Reactions  . Morphine And Related Hives  . Adhesive [Tape] Rash  . Latex Rash   Follow-up Information    Follow up with dialysis [facility] In 1 day.       The results of significant diagnostics from this hospitalization (including imaging, microbiology, ancillary and laboratory) are listed below for reference.      Labs: Basic Metabolic Panel:  Recent Labs Lab 10/15/14 1900 10/16/14 0838 10/16/14 1333  NA 133*  --  141  K >7.5* 4.9 5.4*  5.5*  CL 94*  --  97*  CO2 20*  --  27  GLUCOSE 74  --  99  BUN 115*  --  53*  CREATININE 12.58*  --  8.05*  CALCIUM 7.7*  --  7.4*  PHOS  --   --  5.4*   CBC:  Recent Labs Lab 10/15/14 1900 10/16/14 0625 10/16/14 1333  WBC 4.6 4.0 3.8  NEUTROABS 2.4  --   --   HGB 12.5 11.5* 11.0*  HCT 41.5 37.2 35.3  MCV 84.4 84.4 83.7  PLT 147* 130* 137*   Cardiac Enzymes:  Recent Labs Lab 10/15/14 1900 10/16/14 0209 10/16/14 0626 10/16/14 0838 10/16/14 1333  TROPONINI 0.07* 0.08* 0.06* 0.07* 0.08*    Signed:  Loletha Grayer  10/17/2014, 8:55 AM

## 2014-10-17 NOTE — Care Management (Signed)
[facility]

## 2014-10-24 IMAGING — XA IR VASCULAR PROCEDURE
11 of 12 series · 15 of 20 positions shown · IV contrast (IODINE)
Comparison: none

[Series 1: care upper arm · 1 of 2 slices shown (1 of 6)]
[im 1/2]
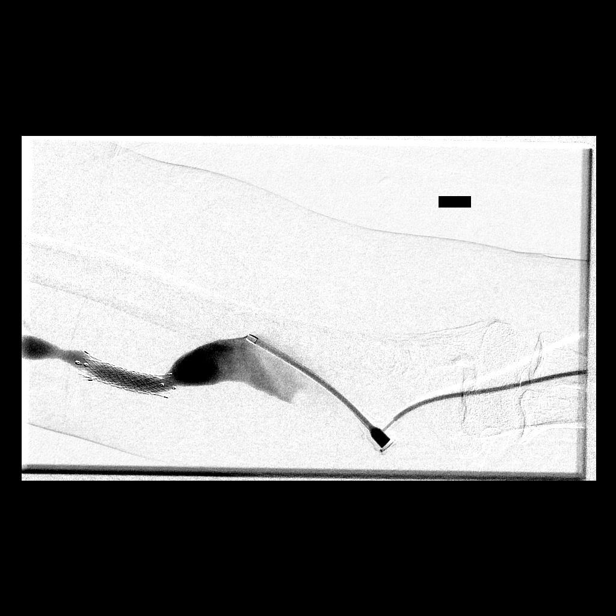

[Series 2: care upper arm · 2 of 2 slices shown (2 of 6)]
[im 1/2]
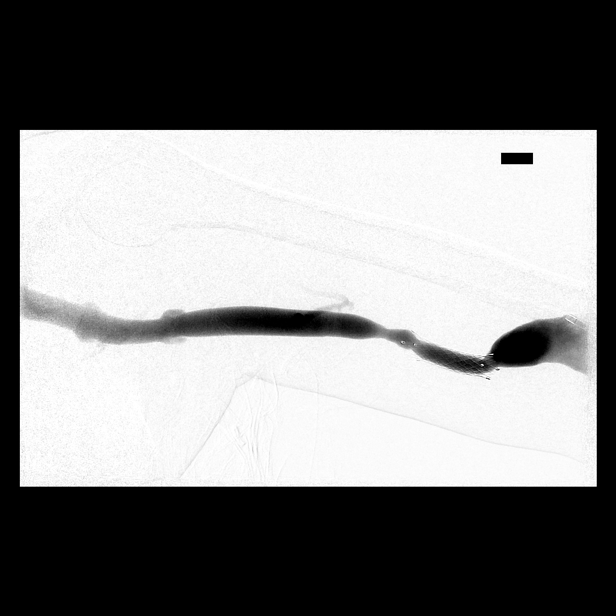
[im 2/2]
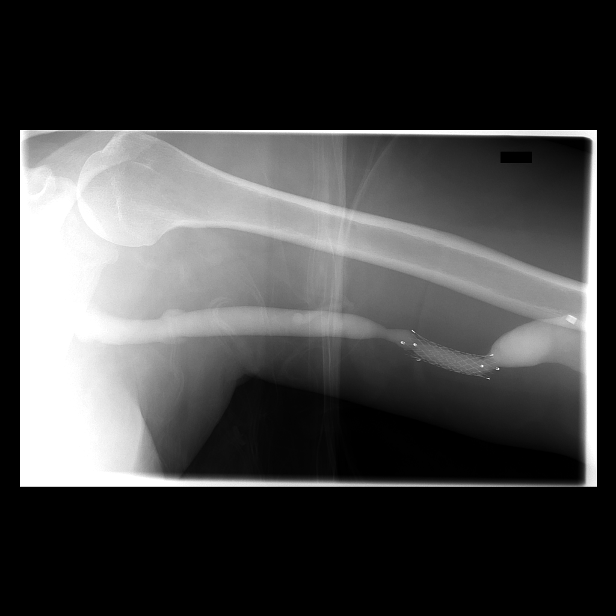

[Series 3: care upper arm · 1 of 2 slices shown (3 of 6)]
[im 1/2]
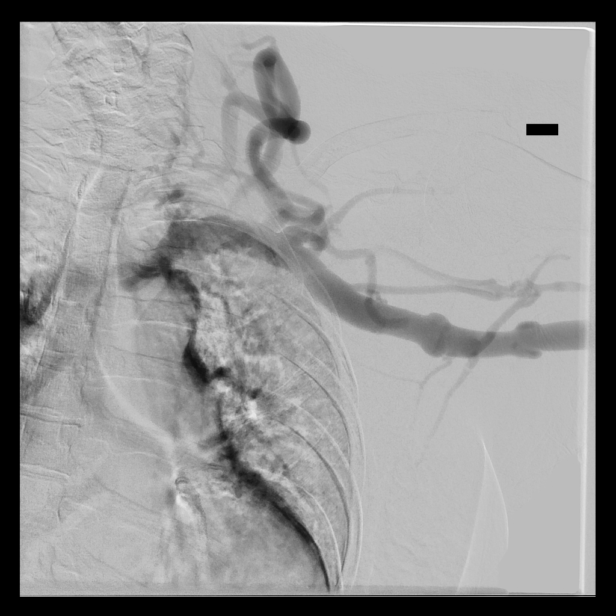

[Series 4: care upper arm · 3 of 4 slices shown (4 of 6)]
[im 1/4]
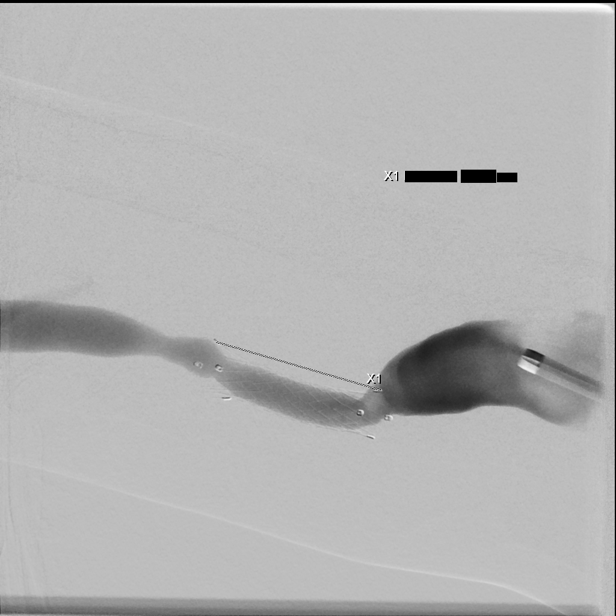
[im 2/4]
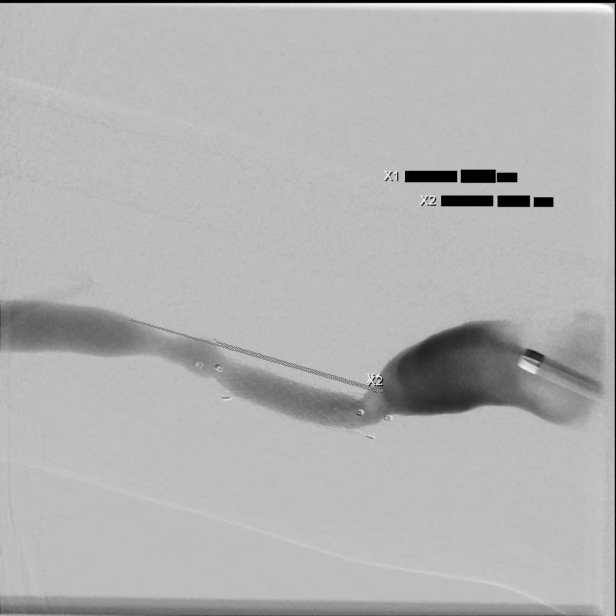
[im 3/4]
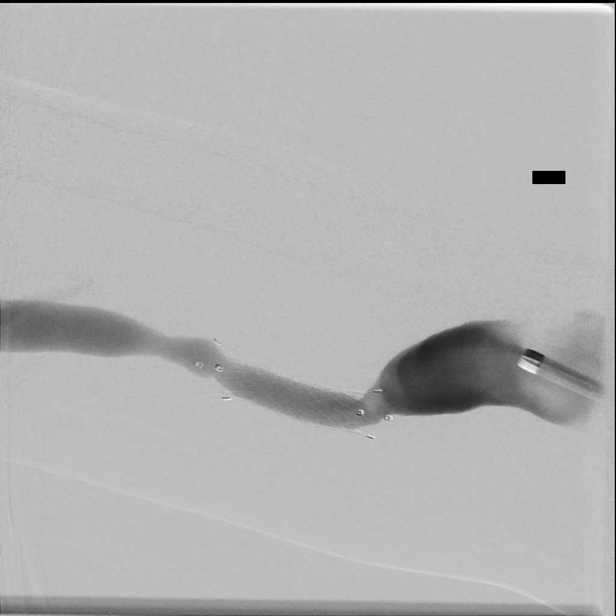

[Series 5: fl - angio · 1 of 1 slices shown (1 of 5)]
[im 1/1]
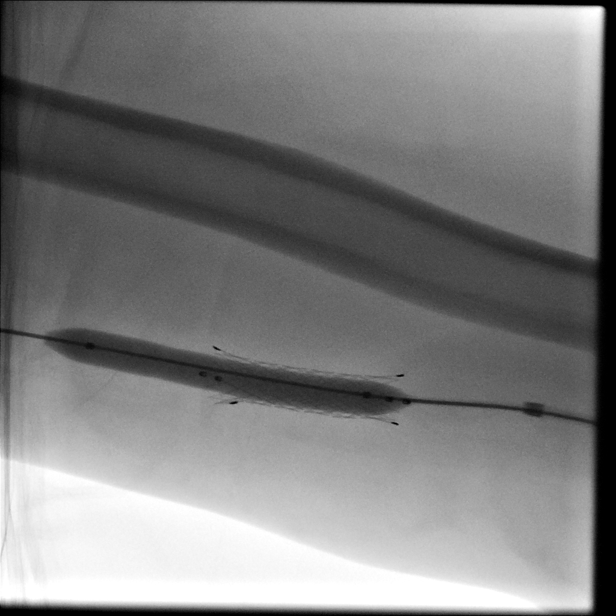

[Series 6: fl - angio · 1 of 1 slices shown (2 of 5)]
[im 1/1]
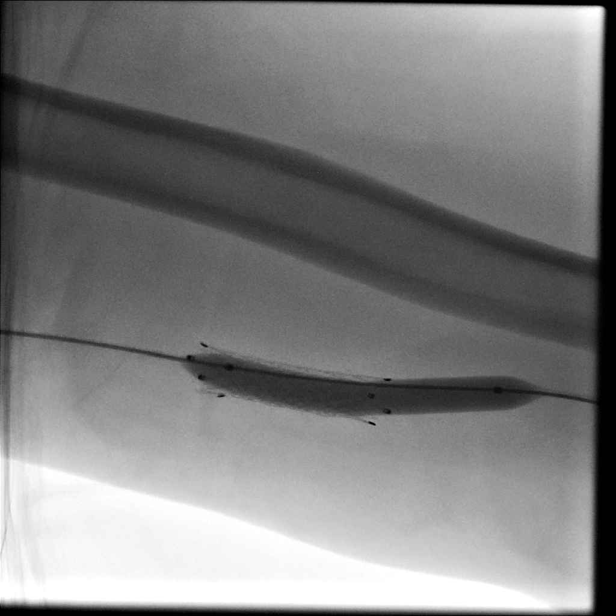

[Series 7: care upper arm · 1 of 2 slices shown (5 of 6)]
[im 1/2]
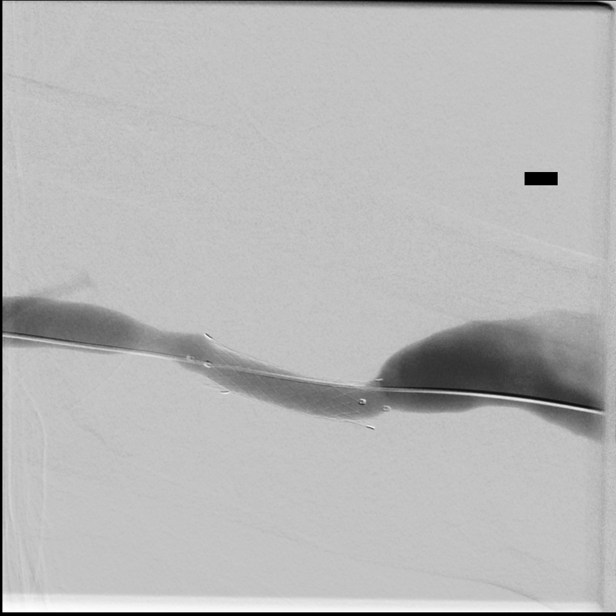

[Series 8: fl - angio · 1 of 1 slices shown (3 of 5)]
[im 1/1]
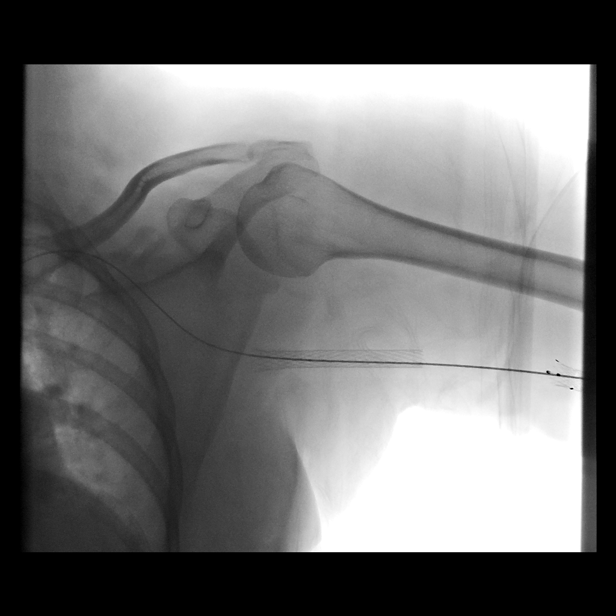

[Series 9: fl - angio · 1 of 1 slices shown (4 of 5)]
[im 1/1]
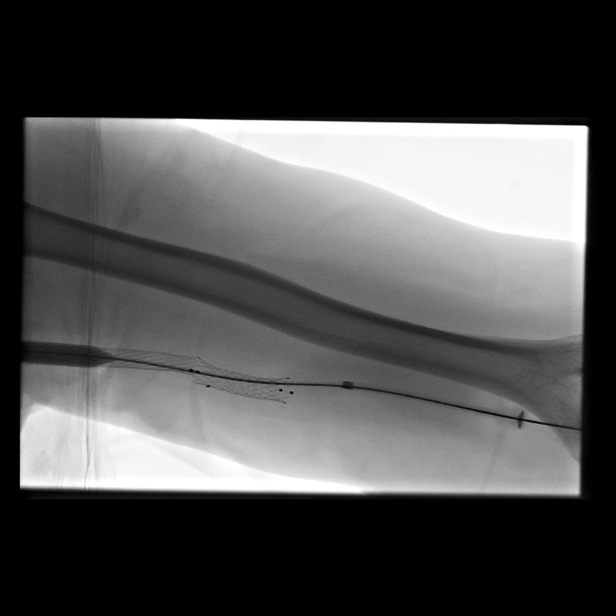

[Series 10: fl - angio · 1 of 1 slices shown (5 of 5)]
[im 1/1]
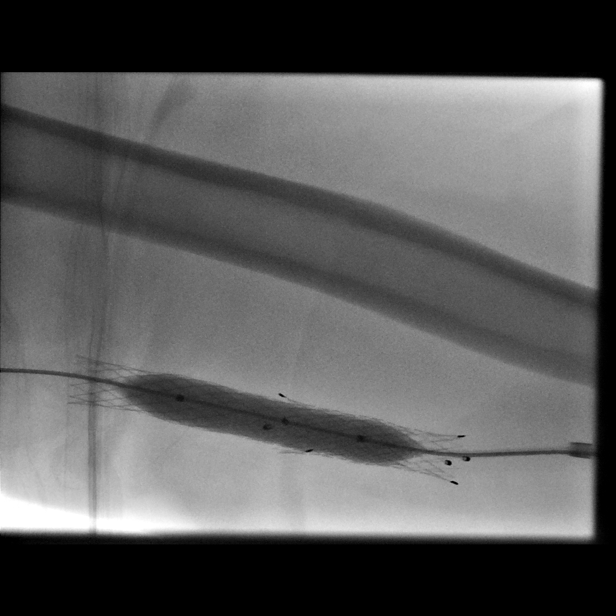

[Series 12: care upper arm · 2 of 2 slices shown (6 of 6)]
[im 1/2]
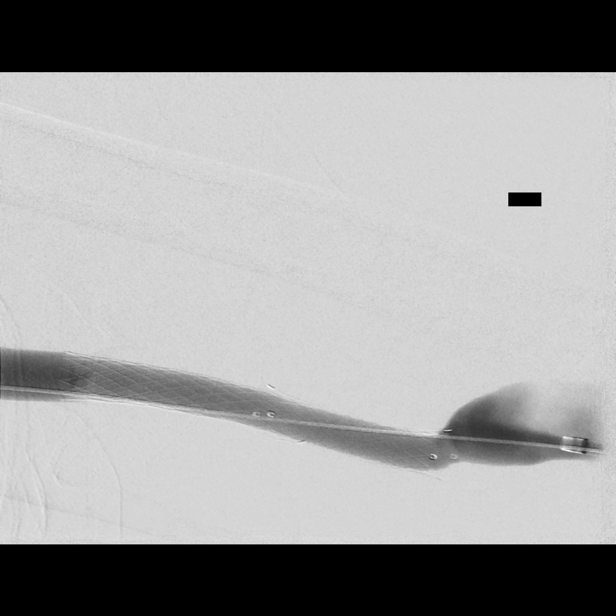
[im 2/2]
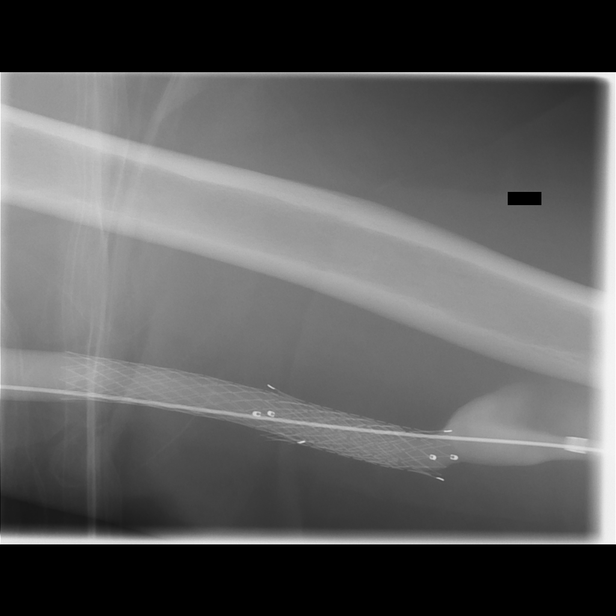

[15 of 20 positions shown; findings below may reference images not displayed]

IMAGES IMPORTED FROM THE SYNGO WORKFLOW SYSTEM
NO DICTATION FOR STUDY

## 2014-11-11 ENCOUNTER — Encounter: Payer: Self-pay | Admitting: Emergency Medicine

## 2014-11-11 ENCOUNTER — Emergency Department
Admission: EM | Admit: 2014-11-11 | Discharge: 2014-11-11 | Disposition: A | Discharge status: Home / Self Care | Payer: Medicare Other | Source: Home / Self Care | Attending: Student | Admitting: Student

## 2014-11-11 DIAGNOSIS — J9610 Chronic respiratory failure, unspecified whether with hypoxia or hypercapnia: Secondary | ICD-10-CM | POA: Diagnosis present

## 2014-11-11 DIAGNOSIS — N186 End stage renal disease: Secondary | ICD-10-CM | POA: Insufficient documentation

## 2014-11-11 DIAGNOSIS — Z9981 Dependence on supplemental oxygen: Secondary | ICD-10-CM

## 2014-11-11 DIAGNOSIS — E785 Hyperlipidemia, unspecified: Secondary | ICD-10-CM | POA: Diagnosis present

## 2014-11-11 DIAGNOSIS — I120 Hypertensive chronic kidney disease with stage 5 chronic kidney disease or end stage renal disease: Secondary | ICD-10-CM

## 2014-11-11 DIAGNOSIS — I2510 Atherosclerotic heart disease of native coronary artery without angina pectoris: Secondary | ICD-10-CM | POA: Diagnosis present

## 2014-11-11 DIAGNOSIS — Z992 Dependence on renal dialysis: Secondary | ICD-10-CM

## 2014-11-11 DIAGNOSIS — Y841 Kidney dialysis as the cause of abnormal reaction of the patient, or of later complication, without mention of misadventure at the time of the procedure: Secondary | ICD-10-CM

## 2014-11-11 DIAGNOSIS — I959 Hypotension, unspecified: Secondary | ICD-10-CM | POA: Diagnosis present

## 2014-11-11 DIAGNOSIS — Z91040 Latex allergy status: Secondary | ICD-10-CM | POA: Insufficient documentation

## 2014-11-11 DIAGNOSIS — Z9111 Patient's noncompliance with dietary regimen: Secondary | ICD-10-CM | POA: Diagnosis present

## 2014-11-11 DIAGNOSIS — Z87891 Personal history of nicotine dependence: Secondary | ICD-10-CM

## 2014-11-11 DIAGNOSIS — I252 Old myocardial infarction: Secondary | ICD-10-CM

## 2014-11-11 DIAGNOSIS — Z7901 Long term (current) use of anticoagulants: Secondary | ICD-10-CM | POA: Insufficient documentation

## 2014-11-11 DIAGNOSIS — Z7982 Long term (current) use of aspirin: Secondary | ICD-10-CM | POA: Insufficient documentation

## 2014-11-11 DIAGNOSIS — D631 Anemia in chronic kidney disease: Secondary | ICD-10-CM | POA: Diagnosis present

## 2014-11-11 DIAGNOSIS — I209 Angina pectoris, unspecified: Secondary | ICD-10-CM | POA: Diagnosis present

## 2014-11-11 DIAGNOSIS — Z79899 Other long term (current) drug therapy: Secondary | ICD-10-CM

## 2014-11-11 DIAGNOSIS — Z955 Presence of coronary angioplasty implant and graft: Secondary | ICD-10-CM

## 2014-11-11 DIAGNOSIS — Z8673 Personal history of transient ischemic attack (TIA), and cerebral infarction without residual deficits: Secondary | ICD-10-CM

## 2014-11-11 DIAGNOSIS — R197 Diarrhea, unspecified: Secondary | ICD-10-CM | POA: Diagnosis present

## 2014-11-11 DIAGNOSIS — E875 Hyperkalemia: Secondary | ICD-10-CM | POA: Diagnosis not present

## 2014-11-11 DIAGNOSIS — R200 Anesthesia of skin: Secondary | ICD-10-CM | POA: Diagnosis present

## 2014-11-11 DIAGNOSIS — R509 Fever, unspecified: Secondary | ICD-10-CM | POA: Diagnosis present

## 2014-11-11 DIAGNOSIS — N2581 Secondary hyperparathyroidism of renal origin: Secondary | ICD-10-CM | POA: Diagnosis present

## 2014-11-11 DIAGNOSIS — I5032 Chronic diastolic (congestive) heart failure: Secondary | ICD-10-CM | POA: Diagnosis present

## 2014-11-11 DIAGNOSIS — Z885 Allergy status to narcotic agent status: Secondary | ICD-10-CM

## 2014-11-11 DIAGNOSIS — Z91048 Other nonmedicinal substance allergy status: Secondary | ICD-10-CM

## 2014-11-11 DIAGNOSIS — T82838A Hemorrhage of vascular prosthetic devices, implants and grafts, initial encounter: Secondary | ICD-10-CM

## 2014-11-11 DIAGNOSIS — T829XXA Unspecified complication of cardiac and vascular prosthetic device, implant and graft, initial encounter: Secondary | ICD-10-CM

## 2014-11-11 DIAGNOSIS — E8779 Other fluid overload: Secondary | ICD-10-CM | POA: Diagnosis present

## 2014-11-11 DIAGNOSIS — R112 Nausea with vomiting, unspecified: Secondary | ICD-10-CM | POA: Diagnosis present

## 2014-11-11 DIAGNOSIS — K219 Gastro-esophageal reflux disease without esophagitis: Secondary | ICD-10-CM | POA: Diagnosis present

## 2014-11-11 LAB — PROTIME-INR
INR: 3.39
Prothrombin Time: 34.3 seconds — ABNORMAL HIGH (ref 11.4–15.0)

## 2014-11-11 MED ORDER — ACETAMINOPHEN 325 MG PO TABS
ORAL_TABLET | ORAL | Status: AC
  Administered 2014-11-11: 650 mg via ORAL
  Filled 2014-11-11: qty 2

## 2014-11-11 MED ORDER — ACETAMINOPHEN 325 MG PO TABS
650.0000 mg | ORAL_TABLET | Freq: Once | ORAL | Status: AC
  Administered 2014-11-11: 650 mg via ORAL

## 2014-11-11 NOTE — ED Provider Notes (Signed)
[facility] Emergency Department Provider Note  ____________________________________________  Time seen: Approximately 3:41 PM  I have reviewed the triage vital signs and the nursing notes.   HISTORY  Chief Complaint Coagulation Disorder    HPI [name] is a 54 y.o. female presents to the emergency department for AV fistula bleeding. She states she had dialysis yesterday and noticed that at 6:30 this morning it started to bleed. She states that she is taking "one and a half" Coumadin per day as advised by Dr. Candiss Norse. She has tried pressure to control the bleeding, but it has not stopped since 6:30. She denies other trauma or other complaints.   Past Medical History  Diagnosis Date  . Heart failure   . Renal failure   . Hypertension   . Myocardial infarction   . Hyperlipidemia   . Stroke 2011    Patient Active Problem List   Diagnosis Date Noted  . Hyperkalemia, diminished renal excretion 10/15/2014  . Generalized weakness 10/15/2014  . Chest pain 10/15/2014  . ESRD on hemodialysis 10/15/2014  . Congestive heart failure (CHF) 10/15/2014  . HTN (hypertension) 10/15/2014  . Retrosternal chest pain 05/30/2013  . Gallstones 05/29/2013    Past Surgical History  Procedure Laterality Date  . Dialysis fistula creation    . Coronary angioplasty with stent placement  2013    Current Outpatient Rx  Name  Route  Sig  Dispense  Refill  . amiodarone (PACERONE) 200 MG tablet   Oral   Take 200 mg by mouth daily.         Marland Kitchen amitriptyline (ELAVIL) 25 MG tablet   Oral   Take 25 mg by mouth at bedtime.         Marland Kitchen aspirin 81 MG tablet   Oral   Take 81 mg by mouth daily.         Marland Kitchen docusate sodium (COLACE) 100 MG capsule   Oral   Take 100 mg by mouth 2 (two) times daily.         . hydrOXYzine (ATARAX/VISTARIL) 25 MG tablet   Oral   Take 1 tablet by mouth 3 (three) times daily.         . midodrine (PROAMATINE) 10 MG tablet   Oral    Take 10 mg by mouth 1 day or 1 dose.         . pantoprazole (PROTONIX) 40 MG tablet   Oral   Take 1 tablet by mouth daily.         . sennosides-docusate sodium (SENOKOT-S) 8.6-50 MG tablet   Oral   Take 2 tablets by mouth daily.         . SENSIPAR 30 MG tablet   Oral   Take 1 tablet by mouth daily.         . sevelamer carbonate (RENVELA) 800 MG tablet   Oral   Take 1,600 mg by mouth 3 (three) times daily with meals.         . warfarin (COUMADIN) 2.5 MG tablet   Oral   Take 2.5 mg by mouth daily.           Allergies Morphine and related; Adhesive; and Latex  Family History  Problem Relation Age of Onset  . Hypertension    . Cancer    . Renal Disease      Social History History  Substance Use Topics  . Smoking status: Former Smoker -- 20 years    Types: Cigarettes  .  Smokeless tobacco: Not on file  . Alcohol Use: No    Review of Systems Constitutional: No fever/chills Cardiovascular: Denies chest pain. Respiratory: Denies shortness of breath. Gastrointestinal: No abdominal pain.  No nausea, no vomiting.  Skin: Bleeding from dialysis site left upper arm over AV fistula Neurological: Negative for headaches, focal weakness or numbness.  10-point ROS otherwise negative.  ____________________________________________   PHYSICAL EXAM:  VITAL SIGNS: ED Triage Vitals  Enc Vitals Group     BP 11/11/14 1046 123/85 mmHg     Pulse Rate 11/11/14 1046 85     Resp --      Temp 11/11/14 1046 98.6 F (37 C)     Temp Source 11/11/14 1046 Oral     SpO2 11/11/14 1046 94 %     Weight 11/11/14 1046 156 lb 8.4 oz (71 kg)     Height 11/11/14 1046 5\' 6"  (1.676 m)     Head Cir --      Peak Flow --      Pain Score --      Pain Loc --      Pain Edu? --      Excl. in [place]? --     Constitutional: Alert and oriented. Well appearing and in no acute distress. Eyes: Conjunctivae are normal. PERRL. EOMI. Head: Atraumatic. Nose: No  congestion/rhinnorhea. Mouth/Throat: Mucous membranes are moist.  Neck: No stridor.   Cardiovascular: Good peripheral circulation. Thrill is present over AV graft Respiratory: Normal respiratory effort.  No retractions.  Gastrointestinal: Soft and nontender. No distention. No abdominal bruits. No CVA tenderness. Musculoskeletal: No lower extremity tenderness nor edema.  No joint effusions. Neurologic:  Normal speech and language. No gross focal neurologic deficits are appreciated. Speech is normal. No gait instability. Skin:  Skin is warm, dry and intact. No rash noted. Steady trickle of blood coming from AV shunt.  Psychiatric: Mood and affect are normal. Speech and behavior are normal.  ____________________________________________   LABS (all labs ordered are listed, but only abnormal results are displayed)  Labs Reviewed  PROTIME-INR - Abnormal; Notable for the following:    Prothrombin Time 34.3 (*)    All other components within normal limits   ____________________________________________  EKG   ____________________________________________  RADIOLOGY   ____________________________________________   PROCEDURES  Procedure(s) performed: None  Critical Care performed: No  ____________________________________________   INITIAL IMPRESSION / ASSESSMENT AND PLAN / ED COURSE  Pertinent labs & imaging results that were available during my care of the patient were reviewed by me and considered in my medical decision making (see chart for details).  We will check PT and INR. And apply pressure dressing.  12:00 PM  Site continues to have slow trickle of blood. 4 x 4 folded and Ace bandage applied for compression. We will monitor about an hour and reassess.  1:00 PM  Ace bandage was removed from the site. No blood present. 4 x 4 taped with mild compression. Plan to wait about 15 minutes and then send patient home.  1:20 PM.  No blood present on 4 x 4. Patient was  advised to hold tonight's dose of Coumadin. She was encouraged to continue her dialysis as scheduled and have her Coumadin level rechecked on her next dialysis day. She was advised to apply the Ace bandage for no more than 30 minutes if the bleeding returns. She was advised to return to the emergency department for bleeding that does not stop after 30 minutes or sooner if bleeding is heavy.  ____________________________________________   FINAL CLINICAL IMPRESSION(S) / ED DIAGNOSES  Final diagnoses:  Complication of arteriovenous dialysis fistula, initial encounter      Victorino Dike, FNP 11/11/14 1552  Joanne Gavel, MD 11/12/14 1540

## 2014-11-11 NOTE — Discharge Instructions (Signed)
Dialysis Vascular Access Malfunction  If the bleeding returns, hold pressure and an ice pack over the area for 15 minutes. Do not take your warfarin dose tonight. Return to the ER if bleeding returns and you are unable to get the bleeding to stop.   A vascular access is an entrance to your blood vessels that can be used for dialysis. A vascular access can be made in one of several ways:   Joining an artery to a vein under your skin to make a bigger blood vessel called a fistula.   Joining an artery to a vein under your skin using a soft tube called a graft.   Placing a thin, flexible tube (catheter) in a large vein, usually in your neck.  A vascular access may malfunction or become blocked.  WHAT CAN CAUSE YOUR VASCULAR ACCESS TO MALFUNCTION?  Infection (common).   A blood clot inside a part of the fistula, graft, or catheter. A blood clot can completely or partially block the flow of blood.   A kink in the graft or catheter.   A collection of blood (called a hematoma or bruise) next to the graft or catheter that pushes against it, blocking the flow of blood.  WHAT ARE SIGNS AND SYMPTOMS OF VASCULAR ACCESS MALFUNCTION?  There is a change in the vibration or pulse of your fistula or graft.  The vibration or pulse of your fistula or graft is gone.   There is new or unusual swelling of the area around the access.   There was an unsuccessful puncture of your access by the dialysis team.   The flow of blood through the fistula, graft, or catheter is too slow for effective dialysis.   When routine dialysis is completed and the needle is removed, bleeding lasts for too long a time.  WHAT HAPPENS IF MY VASCULAR ACCESS MALFUNCTIONS? Your health care provider may order blood work, cultures, or an X-ray test in order to learn what may be wrong with your vascular access. The X-ray test involves the injection of a liquid into the vascular access. The liquid shows up on the X-ray  and allows your health care provider to see if there is a blockage in the vascular access.  Treatment varies depending on the cause of the malfunction:   If the vascular access is infected, your health care provider may prescribe antibiotic medicine to control the infection.   If a clot is found in the vascular access, you may need surgery to remove the clot.   If a blockage in the vascular access is due to some other cause (such as a kink in a graft), then you will likely need surgery to unblock or replace the graft.  HOME CARE INSTRUCTIONS: Follow up with your surgeon or other health care provider if you were instructed to do so. This is very important. Any delay in follow-up could cause permanent dysfunction of the vascular access, which may be dangerous.  SEEK MEDICAL CARE IF:   Fever develops.   Swelling and pain around the vascular access gets worse or new pain develops.  Pain, numbness, or an unusual pale skin color develops in the hand on the side of your vascular access. SEEK IMMEDIATE MEDICAL CARE IF: Unusual bleeding develops at the location of the vascular access. MAKE SURE YOU:  Understand these instructions.  Will watch your condition.  Will get help right away if you are not doing well or get worse. Document Released: 05/04/2006 Document Revised: 10/16/2013 Document  Reviewed: 11/03/2012 Exit[employer] Patient Information 2015 [place], [place]. This information is not intended to replace advice given to you by your health care provider. Make sure you discuss any questions you have with your health care provider.

## 2014-11-11 NOTE — ED Notes (Signed)
Pinpoint wound located on left arm fistula. Wound noted as slowly bleeding. Controlled with 4x4 and kerlex and elevation.   Patient has no other complaints

## 2014-11-11 NOTE — ED Notes (Signed)
Patient from Home via [facility] with c/o bleeding fistula. Patient on warfarin

## 2014-11-13 ENCOUNTER — Inpatient Hospital Stay
Admission: EM | Admit: 2014-11-13 | Discharge: 2014-11-15 | DRG: 640 | Disposition: A | Discharge status: Home / Self Care | Payer: Medicare Other | Attending: Internal Medicine | Admitting: Internal Medicine

## 2014-11-13 DIAGNOSIS — Z79899 Other long term (current) drug therapy: Secondary | ICD-10-CM | POA: Diagnosis not present

## 2014-11-13 DIAGNOSIS — Z885 Allergy status to narcotic agent status: Secondary | ICD-10-CM | POA: Diagnosis not present

## 2014-11-13 DIAGNOSIS — Z7901 Long term (current) use of anticoagulants: Secondary | ICD-10-CM | POA: Diagnosis not present

## 2014-11-13 DIAGNOSIS — Z8673 Personal history of transient ischemic attack (TIA), and cerebral infarction without residual deficits: Secondary | ICD-10-CM | POA: Diagnosis not present

## 2014-11-13 DIAGNOSIS — R509 Fever, unspecified: Secondary | ICD-10-CM

## 2014-11-13 DIAGNOSIS — K219 Gastro-esophageal reflux disease without esophagitis: Secondary | ICD-10-CM | POA: Diagnosis present

## 2014-11-13 DIAGNOSIS — E875 Hyperkalemia: Secondary | ICD-10-CM | POA: Diagnosis present

## 2014-11-13 DIAGNOSIS — D631 Anemia in chronic kidney disease: Secondary | ICD-10-CM | POA: Diagnosis present

## 2014-11-13 DIAGNOSIS — R531 Weakness: Secondary | ICD-10-CM

## 2014-11-13 DIAGNOSIS — I252 Old myocardial infarction: Secondary | ICD-10-CM | POA: Diagnosis not present

## 2014-11-13 DIAGNOSIS — I959 Hypotension, unspecified: Secondary | ICD-10-CM | POA: Diagnosis present

## 2014-11-13 DIAGNOSIS — I209 Angina pectoris, unspecified: Secondary | ICD-10-CM | POA: Diagnosis present

## 2014-11-13 DIAGNOSIS — J9610 Chronic respiratory failure, unspecified whether with hypoxia or hypercapnia: Secondary | ICD-10-CM | POA: Diagnosis present

## 2014-11-13 DIAGNOSIS — E785 Hyperlipidemia, unspecified: Secondary | ICD-10-CM | POA: Diagnosis present

## 2014-11-13 DIAGNOSIS — R197 Diarrhea, unspecified: Secondary | ICD-10-CM | POA: Diagnosis present

## 2014-11-13 DIAGNOSIS — N2581 Secondary hyperparathyroidism of renal origin: Secondary | ICD-10-CM | POA: Diagnosis present

## 2014-11-13 DIAGNOSIS — R072 Precordial pain: Secondary | ICD-10-CM | POA: Diagnosis present

## 2014-11-13 DIAGNOSIS — Z992 Dependence on renal dialysis: Secondary | ICD-10-CM | POA: Diagnosis not present

## 2014-11-13 DIAGNOSIS — Z955 Presence of coronary angioplasty implant and graft: Secondary | ICD-10-CM | POA: Diagnosis not present

## 2014-11-13 DIAGNOSIS — N186 End stage renal disease: Secondary | ICD-10-CM

## 2014-11-13 DIAGNOSIS — Z87891 Personal history of nicotine dependence: Secondary | ICD-10-CM | POA: Diagnosis not present

## 2014-11-13 DIAGNOSIS — Z91048 Other nonmedicinal substance allergy status: Secondary | ICD-10-CM | POA: Diagnosis not present

## 2014-11-13 DIAGNOSIS — I120 Hypertensive chronic kidney disease with stage 5 chronic kidney disease or end stage renal disease: Secondary | ICD-10-CM | POA: Diagnosis present

## 2014-11-13 DIAGNOSIS — Z9981 Dependence on supplemental oxygen: Secondary | ICD-10-CM | POA: Diagnosis not present

## 2014-11-13 DIAGNOSIS — R112 Nausea with vomiting, unspecified: Secondary | ICD-10-CM | POA: Diagnosis present

## 2014-11-13 DIAGNOSIS — Z7982 Long term (current) use of aspirin: Secondary | ICD-10-CM | POA: Diagnosis not present

## 2014-11-13 DIAGNOSIS — Z9111 Patient's noncompliance with dietary regimen: Secondary | ICD-10-CM | POA: Diagnosis present

## 2014-11-13 DIAGNOSIS — R200 Anesthesia of skin: Secondary | ICD-10-CM | POA: Diagnosis present

## 2014-11-13 DIAGNOSIS — I5032 Chronic diastolic (congestive) heart failure: Secondary | ICD-10-CM | POA: Diagnosis present

## 2014-11-13 DIAGNOSIS — E8779 Other fluid overload: Secondary | ICD-10-CM | POA: Diagnosis present

## 2014-11-13 DIAGNOSIS — I2510 Atherosclerotic heart disease of native coronary artery without angina pectoris: Secondary | ICD-10-CM | POA: Diagnosis present

## 2014-11-13 HISTORY — DX: Hyperkalemia: E87.5

## 2014-11-13 LAB — RENAL FUNCTION PANEL
ALBUMIN: 3.6 g/dL (ref 3.5–5.0)
Anion gap: 23 — ABNORMAL HIGH (ref 5–15)
BUN: 94 mg/dL — AB (ref 6–20)
CO2: 15 mmol/L — ABNORMAL LOW (ref 22–32)
CREATININE: 11.07 mg/dL — AB (ref 0.44–1.00)
Calcium: 8.2 mg/dL — ABNORMAL LOW (ref 8.9–10.3)
Chloride: 97 mmol/L — ABNORMAL LOW (ref 101–111)
GFR calc Af Amer: 4 mL/min — ABNORMAL LOW (ref >60)
GFR calc non Af Amer: 4 mL/min — ABNORMAL LOW (ref >60)
Glucose, Bld: 100 mg/dL — ABNORMAL HIGH (ref 65–99)
PHOSPHORUS: 8.4 mg/dL — AB (ref 2.5–4.6)
Sodium: 135 mmol/L (ref 135–145)

## 2014-11-13 LAB — COMPREHENSIVE METABOLIC PANEL
ALT: 68 U/L — AB (ref 14–54)
AST: 79 U/L — AB (ref 15–41)
Albumin: 4 g/dL (ref 3.5–5.0)
Alkaline Phosphatase: 128 U/L — ABNORMAL HIGH (ref 38–126)
Anion gap: 20 — ABNORMAL HIGH (ref 5–15)
BUN: 88 mg/dL — AB (ref 6–20)
CALCIUM: 8.4 mg/dL — AB (ref 8.9–10.3)
CO2: 20 mmol/L — AB (ref 22–32)
CREATININE: 10.67 mg/dL — AB (ref 0.44–1.00)
Chloride: 96 mmol/L — ABNORMAL LOW (ref 101–111)
GFR calc Af Amer: 4 mL/min — ABNORMAL LOW (ref >60)
GFR, EST NON AFRICAN AMERICAN: 4 mL/min — AB (ref >60)
Glucose, Bld: 66 mg/dL (ref 65–99)
Potassium: >7.5 mmol/L (ref 3.5–5.1)
Sodium: 136 mmol/L (ref 135–145)
Total Bilirubin: 0.7 mg/dL (ref 0.3–1.2)
Total Protein: 9.1 g/dL — ABNORMAL HIGH (ref 6.5–8.1)

## 2014-11-13 LAB — CBC WITH DIFFERENTIAL/PLATELET
BASOS PCT: 1 %
Basophils Absolute: 0.1 10*3/uL (ref 0–0.1)
EOS ABS: 0.1 10*3/uL (ref 0–0.7)
EOS PCT: 1 %
HCT: 47.8 % — ABNORMAL HIGH (ref 35.0–47.0)
Hemoglobin: 14.2 g/dL (ref 12.0–16.0)
LYMPHS PCT: 24 %
Lymphs Abs: 1.2 10*3/uL (ref 1.0–3.6)
MCH: 25.4 pg — ABNORMAL LOW (ref 26.0–34.0)
MCHC: 29.7 g/dL — ABNORMAL LOW (ref 32.0–36.0)
MCV: 85.7 fL (ref 80.0–100.0)
Monocytes Absolute: 0.6 10*3/uL (ref 0.2–0.9)
Monocytes Relative: 11 %
Neutro Abs: 3.2 10*3/uL (ref 1.4–6.5)
Neutrophils Relative %: 63 %
Platelets: 183 10*3/uL (ref 150–440)
RBC: 5.57 MIL/uL — ABNORMAL HIGH (ref 3.80–5.20)
RDW: 22 % — ABNORMAL HIGH (ref 11.5–14.5)
WBC: 5.2 10*3/uL (ref 3.6–11.0)

## 2014-11-13 LAB — GLUCOSE, CAPILLARY: Glucose-Capillary: 57 mg/dL — ABNORMAL LOW (ref 65–99)

## 2014-11-13 LAB — CBC
HEMATOCRIT: 47.1 % — AB (ref 35.0–47.0)
Hemoglobin: 14.2 g/dL (ref 12.0–16.0)
MCH: 25.7 pg — AB (ref 26.0–34.0)
MCHC: 30.1 g/dL — AB (ref 32.0–36.0)
MCV: 85.5 fL (ref 80.0–100.0)
Platelets: 169 10*3/uL (ref 150–440)
RBC: 5.51 MIL/uL — ABNORMAL HIGH (ref 3.80–5.20)
RDW: 21.3 % — AB (ref 11.5–14.5)
WBC: 4.8 10*3/uL (ref 3.6–11.0)

## 2014-11-13 LAB — C DIFFICILE QUICK SCREEN W PCR REFLEX
C DIFFICILE (CDIFF) TOXIN: NEGATIVE
C Diff antigen: NEGATIVE
C Diff interpretation: NEGATIVE

## 2014-11-13 LAB — PHOSPHORUS: PHOSPHORUS: 8.6 mg/dL — AB (ref 2.5–4.6)

## 2014-11-13 LAB — POTASSIUM
POTASSIUM: 4.9 mmol/L (ref 3.5–5.1)
POTASSIUM: 3.8 mmol/L (ref 3.5–5.1)

## 2014-11-13 LAB — PROTIME-INR
INR: 2.98
Prothrombin Time: 31 seconds — ABNORMAL HIGH (ref 11.4–15.0)

## 2014-11-13 LAB — TROPONIN I: Troponin I: 0.06 ng/mL — ABNORMAL HIGH (ref <0.031)

## 2014-11-13 MED ORDER — MIDODRINE HCL 5 MG PO TABS
10.0000 mg | ORAL_TABLET | ORAL | Status: DC
  Administered 2014-11-13 – 2014-11-14 (×2): 10 mg via ORAL
  Filled 2014-11-13 (×3): qty 2

## 2014-11-13 MED ORDER — WARFARIN SODIUM 1 MG PO TABS
2.5000 mg | ORAL_TABLET | Freq: Every day | ORAL | Status: DC
  Administered 2014-11-13: 2.5 mg via ORAL
  Filled 2014-11-13: qty 3

## 2014-11-13 MED ORDER — SODIUM CHLORIDE 0.9 % IJ SOLN: 3.0000 mL | Freq: Two times a day (BID) | INTRAMUSCULAR | Status: DC

## 2014-11-13 MED ORDER — HEPARIN SODIUM (PORCINE) 5000 UNIT/ML IJ SOLN: 5000.0000 [IU] | Freq: Three times a day (TID) | INTRAMUSCULAR | Status: DC

## 2014-11-13 MED ORDER — SEVELAMER CARBONATE 800 MG PO TABS
1600.0000 mg | ORAL_TABLET | Freq: Three times a day (TID) | ORAL | Status: DC
  Administered 2014-11-14 – 2014-11-15 (×4): 1600 mg via ORAL
  Filled 2014-11-13 (×4): qty 2

## 2014-11-13 MED ORDER — SODIUM BICARBONATE 8.4 % IV SOLN: 50.0000 meq | Freq: Once | INTRAVENOUS | Status: DC

## 2014-11-13 MED ORDER — CYCLOBENZAPRINE HCL 10 MG PO TABS
10.0000 mg | ORAL_TABLET | Freq: Once | ORAL | Status: AC
  Administered 2014-11-13: 10 mg via ORAL

## 2014-11-13 MED ORDER — ALBUTEROL SULFATE (2.5 MG/3ML) 0.083% IN NEBU: 2.5000 mg | INHALATION_SOLUTION | RESPIRATORY_TRACT | Status: DC | PRN

## 2014-11-13 MED ORDER — BISACODYL 5 MG PO TBEC
5.0000 mg | DELAYED_RELEASE_TABLET | Freq: Every day | ORAL | Status: DC | PRN
  Filled 2014-11-13: qty 1

## 2014-11-13 MED ORDER — ALBUTEROL SULFATE (2.5 MG/3ML) 0.083% IN NEBU
INHALATION_SOLUTION | RESPIRATORY_TRACT | Status: AC
  Administered 2014-11-13: 2.5 mg via RESPIRATORY_TRACT
  Filled 2014-11-13: qty 3

## 2014-11-13 MED ORDER — ALBUTEROL SULFATE (2.5 MG/3ML) 0.083% IN NEBU
2.5000 mg | INHALATION_SOLUTION | Freq: Once | RESPIRATORY_TRACT | Status: AC
  Administered 2014-11-13: 2.5 mg via RESPIRATORY_TRACT

## 2014-11-13 MED ORDER — ONDANSETRON 8 MG PO TBDP
ORAL_TABLET | ORAL | Status: AC
  Administered 2014-11-13: 8 mg via ORAL
  Filled 2014-11-13: qty 1

## 2014-11-13 MED ORDER — INSULIN ASPART 100 UNIT/ML ~~LOC~~ SOLN: 5.0000 [IU] | Freq: Once | SUBCUTANEOUS | Status: DC

## 2014-11-13 MED ORDER — ALUM & MAG HYDROXIDE-SIMETH 200-200-20 MG/5ML PO SUSP: 30.0000 mL | Freq: Four times a day (QID) | ORAL | Status: DC | PRN

## 2014-11-13 MED ORDER — SODIUM CHLORIDE 0.9 % IV SOLN: 1.0000 g | Freq: Once | INTRAVENOUS | Status: DC

## 2014-11-13 MED ORDER — PANTOPRAZOLE SODIUM 40 MG PO TBEC
40.0000 mg | DELAYED_RELEASE_TABLET | Freq: Every day | ORAL | Status: DC
  Administered 2014-11-13 – 2014-11-15 (×3): 40 mg via ORAL
  Filled 2014-11-13 (×3): qty 1

## 2014-11-13 MED ORDER — ASPIRIN EC 81 MG PO TBEC
81.0000 mg | DELAYED_RELEASE_TABLET | Freq: Every day | ORAL | Status: DC
  Administered 2014-11-13 – 2014-11-14 (×2): 81 mg via ORAL
  Filled 2014-11-13 (×2): qty 1

## 2014-11-13 MED ORDER — SODIUM CHLORIDE 0.9 % IJ SOLN: 3.0000 mL | INTRAMUSCULAR | Status: DC | PRN

## 2014-11-13 MED ORDER — ONDANSETRON HCL 4 MG/2ML IJ SOLN: 4.0000 mg | Freq: Four times a day (QID) | INTRAMUSCULAR | Status: DC | PRN

## 2014-11-13 MED ORDER — SODIUM CHLORIDE 0.9 % IV SOLN: 100.0000 mL | INTRAVENOUS | Status: DC | PRN

## 2014-11-13 MED ORDER — ACETAMINOPHEN 325 MG PO TABS
650.0000 mg | ORAL_TABLET | Freq: Four times a day (QID) | ORAL | Status: DC | PRN
  Administered 2014-11-14: 650 mg via ORAL
  Filled 2014-11-13: qty 2

## 2014-11-13 MED ORDER — HYDROXYZINE HCL 25 MG PO TABS
25.0000 mg | ORAL_TABLET | Freq: Three times a day (TID) | ORAL | Status: DC
  Administered 2014-11-13 – 2014-11-15 (×5): 25 mg via ORAL
  Filled 2014-11-13 (×5): qty 1

## 2014-11-13 MED ORDER — ONDANSETRON HCL 4 MG PO TABS: 4.0000 mg | ORAL_TABLET | Freq: Four times a day (QID) | ORAL | Status: DC | PRN

## 2014-11-13 MED ORDER — INSULIN ASPART 100 UNIT/ML ~~LOC~~ SOLN
SUBCUTANEOUS | Status: AC
  Filled 2014-11-13: qty 5

## 2014-11-13 MED ORDER — SODIUM BICARBONATE 8.4 % IV SOLN
INTRAVENOUS | Status: AC
  Filled 2014-11-13: qty 50

## 2014-11-13 MED ORDER — SODIUM CHLORIDE 0.9 % IV SOLN: 250.0000 mL | INTRAVENOUS | Status: DC | PRN

## 2014-11-13 MED ORDER — CINACALCET HCL 30 MG PO TABS
30.0000 mg | ORAL_TABLET | Freq: Every day | ORAL | Status: DC
  Administered 2014-11-13 – 2014-11-15 (×3): 30 mg via ORAL
  Filled 2014-11-13 (×3): qty 1

## 2014-11-13 MED ORDER — SENNA-DOCUSATE SODIUM 8.6-50 MG PO TABS
2.0000 | ORAL_TABLET | Freq: Every day | ORAL | Status: DC
Start: 2014-11-13 — End: 2014-11-15
  Administered 2014-11-13 – 2014-11-15 (×2): 2 via ORAL
  Filled 2014-11-13 (×4): qty 2

## 2014-11-13 MED ORDER — LIDOCAINE HCL (PF) 1 % IJ SOLN
5.0000 mL | INTRAMUSCULAR | Status: DC | PRN
  Administered 2014-11-15: 11:00:00 5 mL via INTRADERMAL
  Filled 2014-11-13 (×2): qty 5

## 2014-11-13 MED ORDER — AMITRIPTYLINE HCL 25 MG PO TABS
25.0000 mg | ORAL_TABLET | Freq: Every day | ORAL | Status: DC
  Administered 2014-11-13 – 2014-11-14 (×2): 25 mg via ORAL
  Filled 2014-11-13 (×2): qty 1

## 2014-11-13 MED ORDER — ATROPINE SULFATE 0.4 MG/ML IJ SOLN
0.4000 mg | INTRAMUSCULAR | Status: DC | PRN
  Filled 2014-11-13: qty 1

## 2014-11-13 MED ORDER — DEXTROSE 50 % IV SOLN
INTRAVENOUS | Status: AC
  Filled 2014-11-13: qty 50

## 2014-11-13 MED ORDER — CYCLOBENZAPRINE HCL 10 MG PO TABS
ORAL_TABLET | ORAL | Status: AC
  Administered 2014-11-13: 10 mg via ORAL
  Filled 2014-11-13: qty 1

## 2014-11-13 MED ORDER — DEXTROSE 50 % IV SOLN
1.0000 | Freq: Once | INTRAVENOUS | Status: DC
  Filled 2014-11-13: qty 50

## 2014-11-13 MED ORDER — ACETAMINOPHEN 650 MG RE SUPP
650.0000 mg | Freq: Four times a day (QID) | RECTAL | Status: DC | PRN
Start: 2014-11-13 — End: 2014-11-15

## 2014-11-13 MED ORDER — ONDANSETRON 8 MG PO TBDP
8.0000 mg | ORAL_TABLET | Freq: Once | ORAL | Status: AC
  Administered 2014-11-13: 8 mg via ORAL

## 2014-11-13 MED ORDER — DOCUSATE SODIUM 100 MG PO CAPS
100.0000 mg | ORAL_CAPSULE | Freq: Two times a day (BID) | ORAL | Status: DC
  Administered 2014-11-13 – 2014-11-15 (×4): 100 mg via ORAL
  Filled 2014-11-13 (×4): qty 1

## 2014-11-13 MED ORDER — ASPIRIN 81 MG PO CHEW
CHEWABLE_TABLET | ORAL | Status: AC
  Administered 2014-11-13: 81 mg
  Filled 2014-11-13: qty 1

## 2014-11-13 MED ORDER — AMIODARONE HCL 200 MG PO TABS
200.0000 mg | ORAL_TABLET | Freq: Every day | ORAL | Status: DC
  Administered 2014-11-13 – 2014-11-14 (×2): 200 mg via ORAL
  Filled 2014-11-13 (×2): qty 1

## 2014-11-13 NOTE — ED Notes (Signed)
Pt c/o bilateral lower leg weakness and numbness this AM after shower. Pt also c/o nausea, vomiting and diarrhea since yesterday. States last time she felt this way that her potassium was high. Pt c/o 10/10 pain "everywhere" pt smiling and laughing. Pt alert and oriented X4, active, cooperative, pt in NAD. RR even and unlabored, color WNL.

## 2014-11-13 NOTE — ED Notes (Signed)
Pt c/o generalized weakness to all extremities

## 2014-11-13 NOTE — ED Provider Notes (Signed)
Patient seen by my colleague, Dr. Cinda Quest, and is to be admitted to the ICU for ongoing care, dialysis, due to her elevated potassium level.  I was asked to see the patient due to a lack of IV access. The patient has a history of being a difficult stick. Multiple times for made by the nurses earlier today.  Patient is alert and communicative although somewhat uncomfortable. I placed patient in Trendelenburg position. I attempted to cannulate the right EJ. There is moderate amount of scar tissue in the area. Like a flash was unable to cannulate the vessel. I switched the left side. The vessel was visible. I was able to cannulate with a 20-gauge Angiocath into the left EJ.    Ahmed Prima, MD 11/13/14 [phone]

## 2014-11-13 NOTE — Progress Notes (Signed)
ANTICOAGULATION CONSULT NOTE - Initial Consult  Pharmacy Consult for Warfarin Indication: VTE prophylaxis  Allergies  Allergen Reactions  . Morphine And Related Hives  . Adhesive [Tape] Rash  . Latex Rash    Patient Measurements: Height: 5\' 5"  (165.1 cm) Weight: 158 lb (71.668 kg) IBW/kg (Calculated) : 57 Heparin Dosing Weight:   Vital Signs: Temp: 96.6 F (35.9 C) (05/31 1920) Temp Source: Axillary (05/31 1920) BP: 166/111 mmHg (05/31 2000) Pulse Rate: 92 (05/31 1945)  Labs:  Recent Labs  11/11/14 1140 11/13/14 1238  HGB  --  14.2  HCT  --  47.8*  PLT  --  183  LABPROT 34.3* 31.0*  INR 3.39 2.98  CREATININE  --  10.67*    Estimated Creatinine Clearance: 6.2 mL/min (by C-G formula based on Cr of 10.67).   Medical History: Past Medical History  Diagnosis Date  . Heart failure   . Renal failure   . Hypertension   . Myocardial infarction   . Hyperlipidemia   . Stroke 2011    Medications:  Prescriptions prior to admission  Medication Sig Dispense Refill Last Dose  . amiodarone (PACERONE) 200 MG tablet Take 200 mg by mouth daily.   11/12/2014 at Unknown time  . amitriptyline (ELAVIL) 25 MG tablet Take 25 mg by mouth at bedtime.   11/12/2014 at Unknown time  . aspirin 81 MG tablet Take 81 mg by mouth daily.   11/12/2014 at Unknown time  . docusate sodium (COLACE) 100 MG capsule Take 100 mg by mouth 2 (two) times daily.   11/12/2014 at Unknown time  . hydrOXYzine (ATARAX/VISTARIL) 25 MG tablet Take 1 tablet by mouth 3 (three) times daily.   11/12/2014 at Unknown time  . midodrine (PROAMATINE) 10 MG tablet Take 10 mg by mouth 1 day or 1 dose.   11/12/2014 at Unknown time  . pantoprazole (PROTONIX) 40 MG tablet Take 1 tablet by mouth daily.   11/12/2014 at Unknown time  . sennosides-docusate sodium (SENOKOT-S) 8.6-50 MG tablet Take 2 tablets by mouth daily.   11/12/2014 at Unknown time  . SENSIPAR 30 MG tablet Take 1 tablet by mouth daily.   11/12/2014 at Unknown time   . sevelamer carbonate (RENVELA) 800 MG tablet Take 1,600 mg by mouth 3 (three) times daily with meals.   11/12/2014 at Unknown time  . warfarin (COUMADIN) 2.5 MG tablet Take 2.5 mg by mouth daily.   11/12/2014 at 1830    Assessment: 5/31 :  INR = 2.98 Pt was on warfarin 2.5 mg PO at home  Goal of Therapy:  INR 2-3    Plan:   Continue warfarin 2.5 mg PO daily.  Recheck INR on 6/1 with AM labs.   Agatha Duplechain D 11/13/2014,8:06 PM

## 2014-11-13 NOTE — ED Notes (Signed)
Pt unable to stand up for orthostatic VS. Pt TV turned on per request. Head lowered, lights dimmed.

## 2014-11-13 NOTE — ED Provider Notes (Signed)
[facility] Emergency Department Provider Note  ____________________________________________  Time seen: Approximately 5:29 PM  I have reviewed the triage vital signs and the nursing notes.   HISTORY  Chief Complaint Nausea; Emesis; Diarrhea; and Numbness   HPI Vanessa LESEMAN is a 54 y.o. female patient reports she had some nausea vomiting and diarrhea today and did not feel good so she did not get dialysis today however she did come into the emergency room because she felt like her legs were getting numb and not working well. She reports this is what happened last time her potassium was elevated. EKG was done initially and did not show any high peak T waves although the QRS was slightly widened. It was difficult to get blood from the patient however we were finally able to get blood and then had some delays in getting the potassium level back. Once the potassium level returned patient was found in the potassium greater than 7.5 she then got calcium bicarbonate and insulin and glucose and albuterol nebs. Hospitalist then renal workup contacted Dr. Holley Raring will do dialysis hospitalist will admit   Past Medical History  Diagnosis Date  . Heart failure   . Renal failure   . Hypertension   . Myocardial infarction   . Hyperlipidemia   . Stroke 2011    Patient Active Problem List   Diagnosis Date Noted  . Hyperkalemia 11/13/2014  . Generalized weakness 10/15/2014  . Chest pain 10/15/2014  . ESRD on hemodialysis 10/15/2014  . Congestive heart failure (CHF) 10/15/2014  . HTN (hypertension) 10/15/2014  . Retrosternal chest pain 05/30/2013  . Gallstones 05/29/2013    Past Surgical History  Procedure Laterality Date  . Dialysis fistula creation    . Coronary angioplasty with stent placement  2013    Current Outpatient Rx  Name  Route  Sig  Dispense  Refill  . amiodarone (PACERONE) 200 MG tablet   Oral   Take 200 mg by mouth daily.         Marland Kitchen  amitriptyline (ELAVIL) 25 MG tablet   Oral   Take 25 mg by mouth at bedtime.         Marland Kitchen aspirin 81 MG tablet   Oral   Take 81 mg by mouth daily.         Marland Kitchen docusate sodium (COLACE) 100 MG capsule   Oral   Take 100 mg by mouth 2 (two) times daily.         . hydrOXYzine (ATARAX/VISTARIL) 25 MG tablet   Oral   Take 1 tablet by mouth 3 (three) times daily.         . midodrine (PROAMATINE) 10 MG tablet   Oral   Take 10 mg by mouth 1 day or 1 dose.         . pantoprazole (PROTONIX) 40 MG tablet   Oral   Take 1 tablet by mouth daily.         . sennosides-docusate sodium (SENOKOT-S) 8.6-50 MG tablet   Oral   Take 2 tablets by mouth daily.         . SENSIPAR 30 MG tablet   Oral   Take 1 tablet by mouth daily.         . sevelamer carbonate (RENVELA) 800 MG tablet   Oral   Take 1,600 mg by mouth 3 (three) times daily with meals.         . warfarin (COUMADIN) 2.5 MG tablet   Oral  Take 2.5 mg by mouth daily.           Allergies Morphine and related; Adhesive; and Latex  Family History  Problem Relation Age of Onset  . Hypertension    . Cancer    . Renal Disease      Social History History  Substance Use Topics  . Smoking status: Former Smoker -- 20 years    Types: Cigarettes  . Smokeless tobacco: Not on file  . Alcohol Use: No    Review of Systems Constitutional: No fever/chills Eyes: No visual changes. ENT: No sore throat. Cardiovascular: Denies chest pain. Respiratory: Denies shortness of breath. Gastrointestinal: No abdominal pain.  No constipation. Genitourinary: Negative for dysuria. Musculoskeletal: Negative for back pain. Skin: Negative for rash. Neurological: Negative for headaches, legs feel somewhat numb and tingly and do whatever her potassium is high  10-point ROS otherwise negative.  ____________________________________________   PHYSICAL EXAM:  VITAL SIGNS: ED Triage Vitals  Enc Vitals Group     BP 11/13/14 1050  135/90 mmHg     Pulse Rate 11/13/14 1050 82     Resp 11/13/14 1050 18     Temp 11/13/14 1050 98.6 F (37 C)     Temp Source 11/13/14 1050 Oral     SpO2 11/13/14 1050 100 %     Weight 11/13/14 1050 158 lb (71.668 kg)     Height 11/13/14 1050 5\' 5"  (1.651 m)     Head Cir --      Peak Flow --      Pain Score 11/13/14 1051 10     Pain Loc --      Pain Edu? --      Excl. in [place]? --     Constitutional: Alert and oriented. Well appearing and in no acute distress. Eyes: Conjunctivae are normal. PERRL. EOMI. Head: Atraumatic. Nose: No congestion/rhinnorhea. Mouth/Throat: Mucous membranes are moist.  Oropharynx non-erythematous. Neck: No stridor. Cardiovascular: Normal rate, regular rhythm. Grossly normal heart sounds.  Good peripheral circulation. Respiratory: Normal respiratory effort.  No retractions. Lungs CTAB. Gastrointestinal: Soft and nontender. No distention. No abdominal bruits. No CVA tenderness. Musculoskeletal: No lower extremity tenderness nor edema.  No joint effusions. Neurologic:  Normal speech and language. No gross focal neurologic deficits are appreciated. Speech is normal. No gait instability. Skin:  Skin is warm, dry and intact. No rash noted. Psychiatric: Mood and affect are normal. Speech and behavior are normal.  ____________________________________________   LABS (all labs ordered are listed, but only abnormal results are displayed)  Labs Reviewed  COMPREHENSIVE METABOLIC PANEL - Abnormal; Notable for the following:    Potassium >7.5 (*)    Chloride 96 (*)    CO2 20 (*)    BUN 88 (*)    Creatinine, Ser 10.67 (*)    Calcium 8.4 (*)    Total Protein 9.1 (*)    AST 79 (*)    ALT 68 (*)    Alkaline Phosphatase 128 (*)    GFR calc non Af Amer 4 (*)    GFR calc Af Amer 4 (*)    Anion gap 20 (*)    All other components within normal limits  CBC WITH DIFFERENTIAL/PLATELET - Abnormal; Notable for the following:    RBC 5.57 (*)    HCT 47.8 (*)    MCH 25.4  (*)    MCHC 29.7 (*)    RDW 22.0 (*)    All other components within normal limits  PROTIME-INR - Abnormal; Notable  for the following:    Prothrombin Time 31.0 (*)    All other components within normal limits  C DIFFICILE QUICK SCAN W PCR REFLEX ([facility] ONLY)  STOOL CULTURE  LIPASE, BLOOD  PHOSPHORUS  RENAL FUNCTION PANEL  CBC  HEPATITIS B SURFACE ANTIGEN  HEPATITIS B SURFACE ANTIBODY  HEPATITIS B CORE ANTIBODY, TOTAL  BASIC METABOLIC PANEL  TROPONIN I  TROPONIN I  TROPONIN I   ____________________________________________  EKG  EKG did not show any height peak T waves or sign wave progression ____________________________________________  RADIOLOGY   ____________________________________________   PROCEDURES  Procedure(s) performed: None  Critical Care performed: No  ____________________________________________   INITIAL IMPRESSION / ASSESSMENT AND PLAN / ED COURSE  Pertinent labs & imaging results that were available during my care of the patient were reviewed by me and considered in my medical decision making (see chart for details).   ____________________________________________   FINAL CLINICAL IMPRESSION(S) / ED DIAGNOSES  Final diagnoses:  Hyperkalemia, diminished renal excretion     Nena Polio, MD 11/13/14 [id number]

## 2014-11-13 NOTE — Progress Notes (Signed)
HD tx start 

## 2014-11-13 NOTE — ED Notes (Signed)
Pt assisted off of bedside commode

## 2014-11-13 NOTE — ED Notes (Signed)
PT stuck X 4 times by 2 nurses and EDP X 2 for lab work, no success. Lab contacted

## 2014-11-13 NOTE — H&P (Signed)
[name] NAME: [name]    MR#:  [id number]  DATE OF BIRTH:  01/14/1961  DATE OF ADMISSION:  11/13/2014  PRIMARY CARE PHYSICIAN: Marden Noble, MD   REQUESTING/REFERRING PHYSICIAN: Dr. Cinda Quest  CHIEF COMPLAINT:   Chief Complaint  Patient presents with  . Nausea  . Emesis  . Diarrhea  . Numbness    bilteral lower legs    HISTORY OF PRESENT ILLNESS:  HPI: [name] is a 54 y.o. female who is end-stage renal disease, CAD, HTN on hemodialysis TThS. Has been compliant with her hemodialysis. Last round of dialysis was on Saturday. Has had poor by mouth intake for the past 2 days. Does not make any urine. Is on losartan for blood pressure.  Today patient presented for weakness in her lower extremities which tends to happen with high potassium for her. And her potassium here in the emergency room is greater than 7.5. She also complains of chest pain which she had during the last admission when her potassium was high. His chronically elevated troponin. Patient has multiple complaints including chest pain and nausea and generalized weakness, numbness all over. She also complains of pain all over.   PAST MEDICAL HISTORY:   Past Medical History  Diagnosis Date  . Heart failure   . Renal failure   . Hypertension   . Myocardial infarction   . Hyperlipidemia   . Stroke 2011    PAST SURGICAL HISTORY:   Past Surgical History  Procedure Laterality Date  . Dialysis fistula creation    . Coronary angioplasty with stent placement  2013    SOCIAL HISTORY:   History  Substance Use Topics  . Smoking status: Former Smoker -- 20 years    Types: Cigarettes  . Smokeless tobacco: Not on file  . Alcohol Use: No    FAMILY HISTORY:   Family History  Problem Relation Age of Onset  . Hypertension    . Cancer    . Renal Disease      DRUG ALLERGIES:   Allergies  Allergen Reactions  . Morphine And Related  Hives  . Adhesive [Tape] Rash  . Latex Rash    REVIEW OF SYSTEMS:   Review of Systems  Constitutional: Positive for malaise/fatigue. Negative for fever, chills and weight loss.  HENT: Negative for hearing loss and nosebleeds.   Eyes: Negative for blurred vision, double vision and pain.  Respiratory: Negative for cough, hemoptysis, sputum production, shortness of breath and wheezing.   Cardiovascular: Positive for chest pain (also complains of pain all over). Negative for palpitations, orthopnea and leg swelling.  Gastrointestinal: Positive for nausea. Negative for vomiting, abdominal pain, diarrhea and constipation.  Genitourinary: Negative for dysuria and hematuria.  Musculoskeletal: Positive for back pain. Negative for myalgias and falls.  Skin: Negative for rash.  Neurological: Positive for weakness. Negative for dizziness, tremors, sensory change, speech change, focal weakness (generalised weakness), seizures and headaches.  Endo/Heme/Allergies: Does not bruise/bleed easily.  Psychiatric/Behavioral: Negative for depression and memory loss. The patient is not nervous/anxious.     MEDICATIONS AT HOME:   Prior to Admission medications   Medication Sig Start Date End Date Taking? Authorizing Provider  amiodarone (PACERONE) 200 MG tablet Take 200 mg by mouth daily.   Yes Historical Provider, MD  amitriptyline (ELAVIL) 25 MG tablet Take 25 mg by mouth at bedtime.   Yes Historical Provider, MD  aspirin 81 MG tablet Take 81 mg  by mouth daily.   Yes Historical Provider, MD  docusate sodium (COLACE) 100 MG capsule Take 100 mg by mouth 2 (two) times daily.   Yes Historical Provider, MD  hydrOXYzine (ATARAX/VISTARIL) 25 MG tablet Take 1 tablet by mouth 3 (three) times daily. 05/18/13  Yes Historical Provider, MD  midodrine (PROAMATINE) 10 MG tablet Take 10 mg by mouth 1 day or 1 dose.   Yes Historical Provider, MD  pantoprazole (PROTONIX) 40 MG tablet Take 1 tablet by mouth daily. 05/07/13   Yes Historical Provider, MD  sennosides-docusate sodium (SENOKOT-S) 8.6-50 MG tablet Take 2 tablets by mouth daily.   Yes Historical Provider, MD  SENSIPAR 30 MG tablet Take 1 tablet by mouth daily. 05/10/13  Yes Historical Provider, MD  sevelamer carbonate (RENVELA) 800 MG tablet Take 1,600 mg by mouth 3 (three) times daily with meals.   Yes Historical Provider, MD  warfarin (COUMADIN) 2.5 MG tablet Take 2.5 mg by mouth daily.   Yes Historical Provider, MD      VITAL SIGNS:  Blood pressure 146/84, pulse 81, temperature 98.6 F (37 C), temperature source Oral, resp. rate 16, height 5\' 5"  (1.651 m), weight 71.668 kg (158 lb), SpO2 96 %.  PHYSICAL EXAMINATION:  Physical Exam  GENERAL:  54 y.o.-year-old patient lying in the bed with no acute distress.  EYES: Pupils equal, round, reactive to light and accommodation. No scleral icterus. Extraocular muscles intact.  HEENT: Head atraumatic, normocephalic. Oropharynx and nasopharynx clear. No oropharyngeal erythema, moist oral mucosa  NECK:  Supple, no jugular venous distention. No thyroid enlargement, no tenderness.  LUNGS: Normal breath sounds bilaterally, no wheezing, rales, rhonchi. No use of accessory muscles of respiration. Chest wall tender. CARDIOVASCULAR: S1, S2 normal. No murmurs, rubs, or gallops.  ABDOMEN: Soft, nontender, nondistended. Bowel sounds present. No organomegaly or mass.  EXTREMITIES: No pedal edema, cyanosis, or clubbing. + 2 pedal & radial pulses b/l.   NEUROLOGIC: Cranial nerves II through XII are intact. Generalized symmetrical weakness. PSYCHIATRIC: The patient is alert and oriented x 3. Anxious. SKIN: No obvious rash, lesion, or ulcer.   LABORATORY PANEL:   CBC  Recent Labs Lab 11/13/14 1238  WBC 5.2  HGB 14.2  HCT 47.8*  PLT 183   ------------------------------------------------------------------------------------------------------------------  Chemistries   Recent Labs Lab 11/13/14 1238  NA 136   K >7.5*  CL 96*  CO2 20*  GLUCOSE 66  BUN 88*  CREATININE 10.67*  CALCIUM 8.4*  AST 79*  ALT 68*  ALKPHOS 128*  BILITOT 0.7   ------------------------------------------------------------------------------------------------------------------  Cardiac Enzymes No results for input(s): TROPONINI in the last 168 hours. ------------------------------------------------------------------------------------------------------------------  RADIOLOGY:  No results found. EKG- RBBB, prolonged PR and QTc, Inferior Q waves.  IMPRESSION AND PLAN:   * Hyperkalemia with ESRD - EKG changes Patient has been compliant with dialysis. Etiology of hyperkalemia is not clear. At this point patient will receive stat insulin, D50, bicarbonate, albuterol nebulizer, calcium. STAT dialysis. Repeat potassium after HD. Consulted and case discussed with Nephrology.  * Chest pain - Seems to be chronic angina as she had similar pain last admission too. But will check serial troponin. Could be from her generalized muscle spasms from hyperkalemia. Patient has pain all over her body including the chest.  * Generalized weakness - likely secondary to her uremia and hyperkalemia, monitor and reassess after dialysis and improvement of electrolytes.   * Congestive heart failure (CHF), Diastolic - this is a chronic problem, potentially acutely exacerbated by fluid overload due to missing  dialysis. She is getting urgent dialysis as above. She'll be placed on renal fluid restricted diet.  * HTN (hypertension) - chronic problem, continue home medications for this.  Critically ill with high risk for cardiac arrest and death.  All the records are reviewed and case discussed with ED provider. Management plans discussed with the patient, family and they are in agreement.  CODE STATUS: FULL CODE  TOTAL CRITICAL CARE TIME TAKING CARE OF THIS PATIENT: 45 minutes.    Hillary Bow R M.D on 11/13/2014 at 5:01  PM  Between 7am to 6pm - Pager - [phone]  After 6pm go to [email or web address] - password [email or web address] [place] [facility]  Office  [phone]  CC: Primary care physician; Marden Noble, MD

## 2014-11-13 NOTE — ED Notes (Signed)
Lab at pt bedside

## 2014-11-13 NOTE — ED Notes (Signed)
Pt placed on bedpan

## 2014-11-13 NOTE — Progress Notes (Signed)
Pre-hd tx 

## 2014-11-14 ENCOUNTER — Inpatient Hospital Stay: Payer: Medicare Other

## 2014-11-14 LAB — GLUCOSE, CAPILLARY: Glucose-Capillary: 77 mg/dL (ref 65–99)

## 2014-11-14 LAB — BASIC METABOLIC PANEL
ANION GAP: 14 (ref 5–15)
BUN: 40 mg/dL — AB (ref 6–20)
CO2: 30 mmol/L (ref 22–32)
CREATININE: 6.83 mg/dL — AB (ref 0.44–1.00)
Calcium: 7.8 mg/dL — ABNORMAL LOW (ref 8.9–10.3)
Chloride: 95 mmol/L — ABNORMAL LOW (ref 101–111)
GFR calc non Af Amer: 6 mL/min — ABNORMAL LOW (ref >60)
GFR, EST AFRICAN AMERICAN: 7 mL/min — AB (ref >60)
GLUCOSE: 74 mg/dL (ref 65–99)
Potassium: 5 mmol/L (ref 3.5–5.1)
Sodium: 139 mmol/L (ref 135–145)

## 2014-11-14 LAB — TROPONIN I
Troponin I: 0.09 ng/mL — ABNORMAL HIGH (ref <0.031)
Troponin I: 0.09 ng/mL — ABNORMAL HIGH (ref <0.031)

## 2014-11-14 LAB — PROTIME-INR
INR: 4.17 — AB
Prothrombin Time: 40.3 seconds — ABNORMAL HIGH (ref 11.4–15.0)

## 2014-11-14 NOTE — Progress Notes (Signed)
[place] at [place] NAME: [name]    MR#:  [id number]  DATE OF BIRTH:  [birth year]  SUBJECTIVE:  Just feeling weak and sleepy  REVIEW OF SYSTEMS:  Review of Systems  Constitutional: Positive for malaise/fatigue. Negative for fever, weight loss and diaphoresis.  HENT: Negative for ear discharge, ear pain, hearing loss, nosebleeds, sore throat and tinnitus.   Eyes: Negative for blurred vision and pain.  Respiratory: Negative for cough, hemoptysis, shortness of breath and wheezing.   Cardiovascular: Negative for chest pain, palpitations, orthopnea and leg swelling.  Gastrointestinal: Negative for heartburn, nausea, vomiting, abdominal pain, diarrhea, constipation and blood in stool.  Genitourinary: Negative for dysuria, urgency and frequency.  Musculoskeletal: Negative for myalgias and back pain.  Skin: Negative for itching and rash.  Neurological: Positive for weakness. Negative for dizziness, tingling, tremors, focal weakness, seizures and headaches.  Psychiatric/Behavioral: Negative for depression. The patient is not nervous/anxious.    lethargic  DRUG ALLERGIES:   Allergies  Allergen Reactions  . Morphine And Related Hives  . Adhesive [Tape] Rash  . Latex Rash    VITALS:  Blood pressure 98/59, pulse 80, temperature 98.2 F (36.8 C), temperature source Oral, resp. rate 15, height 5\' 5"  (1.651 m), weight 70.5 kg (155 lb 6.8 oz), SpO2 92 %.  PHYSICAL EXAMINATION:  Physical Exam  Constitutional: She appears lethargic. She appears unhealthy. She has a sickly appearance.  HENT:  Head: Normocephalic and atraumatic.  Eyes: Conjunctivae and EOM are normal. Pupils are equal, round, and reactive to light.  Neck: Normal range of motion. Neck supple. No tracheal deviation present. No thyromegaly present.  Cardiovascular: Normal rate, regular rhythm and normal heart sounds.   Left upper extremity AV fistula in place. - No signs  of infection  Pulmonary/Chest: Effort normal and breath sounds normal. No respiratory distress. She has no wheezes. She exhibits no tenderness.  Abdominal: Soft. Bowel sounds are normal. She exhibits no distension. There is no tenderness.  Musculoskeletal: Normal range of motion.  Neurological: She appears lethargic. No cranial nerve deficit.  Skin: Skin is warm and dry. No rash noted.  Psychiatric: Mood and affect normal.    LABORATORY PANEL:   CBC  Recent Labs Lab 11/13/14 1936  WBC 4.8  HGB 14.2  HCT 47.1*  PLT 169   ------------------------------------------------------------------------------------------------------------------  Chemistries   Recent Labs Lab 11/13/14 1238  11/14/14 0654  NA 136  < > 139  K >7.5*  < > 5.0  CL 96*  < > 95*  CO2 20*  < > 30  GLUCOSE 66  < > 74  BUN 88*  < > 40*  CREATININE 10.67*  < > 6.83*  CALCIUM 8.4*  < > 7.8*  AST 79*  --   --   ALT 68*  --   --   ALKPHOS 128*  --   --   BILITOT 0.7  --   --   < > = values in this interval not displayed. ------------------------------------------------------------------------------------------------------------------  Cardiac Enzymes  Recent Labs Lab 11/14/14 0654  TROPONINI 0.09*    ASSESSMENT AND PLAN:   * Fever: Low-grade, but of unknown etiology.  We will get chest x-ray and await blood cultures.  Please note patient has had left a fistula infection back in April 2016 when she had extended stay at [facility].  Though her access site looks clean at this time  * Hyperkalemia with ESRD - EKG changes Status post urgent hemodialysis  last night and potassium is 5.0, now  * Chest pain - Seems to be chronic angina as she had similar pain last admission too. resolved now  * Generalized weakness - likely secondary to her uremia and hyperkalemia, monitor and reassess after dialysis and improvement of electrolytes.   * Congestive heart failure (CHF), Diastolic - this is a chronic  problem, potentially acutely exacerbated by fluid overload due to missing dialysis.on renal fluid restricted diet.  * HTN (hypertension) - chronic problem, continue home medications for this.    All the records are reviewed and case discussed with Care Management/Social Workerr. Management plans discussed with the patient, family and they are in agreement.  CODE STATUS: Full code  TOTAL TIME TAKING CARE OF THIS PATIENT: 35 minutes.   POSSIBLE D/C IN 1 DAYS, DEPENDING ON CLINICAL CONDITION.  Right ear after dialysis tomorrow as long as her cultures remain negative and no recurrence of fever   [facility], Keenan Dimitrov M.D on 11/14/2014 at 12:38 PM  Between 7am to 6pm - Pager - [phone]  After 6pm go to [email or web address] - password [email or web address] [facility]  Office  [phone]  CC: Primary care physician; Marden Noble, MD

## 2014-11-14 NOTE — Progress Notes (Signed)
ANTICOAGULATION CONSULT NOTE - FOLLOW UP  Pharmacy Consult for Warfarin Indication: VTE prophylaxis  Allergies  Allergen Reactions  . Morphine And Related Hives  . Adhesive [Tape] Rash  . Latex Rash    Patient Measurements: Height: 5\' 5"  (165.1 cm) Weight: 155 lb 6.8 oz (70.5 kg) IBW/kg (Calculated) : 57 Heparin Dosing Weight:   Vital Signs: Temp: 98.2 F (36.8 C) (06/01 1107) Temp Source: Oral (06/01 1107) BP: 98/59 mmHg (06/01 1107) Pulse Rate: 80 (06/01 1107)  Labs:  Recent Labs  11/13/14 1238 11/13/14 1936 11/13/14 2150 11/14/14 0654  HGB 14.2 14.2  --   --   HCT 47.8* 47.1*  --   --   PLT 183 169  --   --   LABPROT 31.0*  --   --  40.3*  INR 2.98  --   --  4.17*  CREATININE 10.67* 11.07*  --  6.83*  TROPONINI  --  0.06* 0.09* 0.09*    Estimated Creatinine Clearance: 9.6 mL/min (by C-G formula based on Cr of 6.83).   Medical History: Past Medical History  Diagnosis Date  . Heart failure   . Renal failure   . Hypertension   . Myocardial infarction   . Hyperlipidemia   . Stroke 2011    Medications:  Prescriptions prior to admission  Medication Sig Dispense Refill Last Dose  . amiodarone (PACERONE) 200 MG tablet Take 200 mg by mouth daily.   11/12/2014 at Unknown time  . amitriptyline (ELAVIL) 25 MG tablet Take 25 mg by mouth at bedtime.   11/12/2014 at Unknown time  . aspirin 81 MG tablet Take 81 mg by mouth daily.   11/12/2014 at Unknown time  . docusate sodium (COLACE) 100 MG capsule Take 100 mg by mouth 2 (two) times daily.   11/12/2014 at Unknown time  . hydrOXYzine (ATARAX/VISTARIL) 25 MG tablet Take 1 tablet by mouth 3 (three) times daily.   11/12/2014 at Unknown time  . midodrine (PROAMATINE) 10 MG tablet Take 10 mg by mouth 1 day or 1 dose.   11/12/2014 at Unknown time  . pantoprazole (PROTONIX) 40 MG tablet Take 1 tablet by mouth daily.   11/12/2014 at Unknown time  . sennosides-docusate sodium (SENOKOT-S) 8.6-50 MG tablet Take 2 tablets by mouth  daily.   11/12/2014 at Unknown time  . SENSIPAR 30 MG tablet Take 1 tablet by mouth daily.   11/12/2014 at Unknown time  . sevelamer carbonate (RENVELA) 800 MG tablet Take 1,600 mg by mouth 3 (three) times daily with meals.   11/12/2014 at Unknown time  . warfarin (COUMADIN) 2.5 MG tablet Take 2.5 mg by mouth daily.   11/12/2014 at 1830    Assessment: 5/31:  INR = 2.98 6/1: INR = 4.17  Pt was on warfarin 2.5 mg PO at home, hx of CVA.  Goal of Therapy:  INR 2-3  Plan:   Discontinued warfarin. Recheck INR on 6/2 with AM labs.   Skip Mayer, PharmD  11/14/2014,11:59 AM

## 2014-11-14 NOTE — Progress Notes (Signed)
[facility]  ROUNDING NOTE   Subjective:  Pt came in with weakness. Found to have severe hyperkalemia.  K greater than 7.5 initially. Down to 3.8 this AM.  Had urgent dialysis yesterday.  Objective:  Vital signs in last 24 hours:  Temp:  [96.6 F (35.9 C)-100.5 F (38.1 C)] 98.4 F (36.9 C) (06/01 0700) Pulse Rate:  [45-101] 101 (06/01 0800) Resp:  [10-19] 15 (06/01 0800) BP: (87-172)/(61-118) 103/75 mmHg (06/01 0800) SpO2:  [91 %-100 %] 95 % (06/01 0800) Weight:  [70.5 kg (155 lb 6.8 oz)-71.668 kg (158 lb)] 70.5 kg (155 lb 6.8 oz) (05/31 2253)  Weight change:  Filed Weights   11/13/14 1050 11/13/14 1920 11/13/14 2253  Weight: 71.668 kg (158 lb) 71.668 kg (158 lb) 70.5 kg (155 lb 6.8 oz)    Intake/Output: I/O last 3 completed shifts: In: 240 [P.O.:240] Out: 2000 [Other:2000]   Intake/Output this shift:     Physical Exam: General: NAD  Head: Normocephalic, atraumatic. Moist oral mucosal membranes  Eyes: Anicteric  Neck: Supple, trachea midline  Lungs:  Clear to auscultation normal effort  Heart: Regular rate and rhythm S1S2  Abdomen:  Soft, nontender, BS present  Extremities: trace peripheral edema.  Neurologic: Nonfocal, moving all four extremities  Skin: No lesions  Access: LUE AVF    Basic Metabolic Panel:  Recent Labs Lab 11/13/14 1238 11/13/14 1936 11/13/14 2015 11/13/14 2150  NA 136 135  --   --   K >7.5*  --  4.9 3.8  CL 96* 97*  --   --   CO2 20* 15*  --   --   GLUCOSE 66 100*  --   --   BUN 88* 94*  --   --   CREATININE 10.67* 11.07*  --   --   CALCIUM 8.4* 8.2*  --   --   PHOS  --  8.4*  8.6*  --   --     Liver Function Tests:  Recent Labs Lab 11/13/14 1238 11/13/14 1936  AST 79*  --   ALT 68*  --   ALKPHOS 128*  --   BILITOT 0.7  --   PROT 9.1*  --   ALBUMIN 4.0 3.6   No results for input(s): LIPASE, AMYLASE in the last 168 hours. No results for input(s): AMMONIA in the last 168 hours.  CBC:  Recent  Labs Lab 11/13/14 1238 11/13/14 1936  WBC 5.2 4.8  NEUTROABS 3.2  --   HGB 14.2 14.2  HCT 47.8* 47.1*  MCV 85.7 85.5  PLT 183 169    Cardiac Enzymes:  Recent Labs Lab 11/13/14 1936 11/13/14 2150  TROPONINI 0.06* 0.09*    BNP: Invalid input(s): POCBNP  CBG:  Recent Labs Lab 11/13/14 2005 11/13/14 2046  GLUCAP 44* 75    Microbiology: Results for orders placed or performed during the hospital encounter of 11/13/14  C difficile quick scan w PCR reflex [facility])     Status: None   Collection Time: 11/13/14  2:23 PM  Result Value Ref Range Status   C Diff antigen NEGATIVE  Final   C Diff toxin NEGATIVE  Final   C Diff interpretation NEGATIVE  Final    Coagulation Studies:  Recent Labs  11/11/14 1140 11/13/14 1238 11/14/14 0654  LABPROT 34.3* 31.0* 40.3*  INR 3.39 2.98 4.17*    Urinalysis: No results for input(s): COLORURINE, LABSPEC, PHURINE, GLUCOSEU, HGBUR, BILIRUBINUR, KETONESUR, PROTEINUR, UROBILINOGEN, NITRITE, LEUKOCYTESUR in the last 72 hours.  Invalid  input(s): APPERANCEUR    Imaging: No results found.   Medications:     . amiodarone  200 mg Oral Daily  . amitriptyline  25 mg Oral QHS  . aspirin EC  81 mg Oral Daily  . calcium gluconate 1 GM IV  1 g Intravenous Once  . cinacalcet  30 mg Oral Daily  . dextrose  1 ampule Intravenous Once  . docusate sodium  100 mg Oral BID  . hydrOXYzine  25 mg Oral TID  . insulin aspart  5 Units Intravenous Once  . midodrine  10 mg Oral 1 day or 1 dose  . pantoprazole  40 mg Oral Daily  . sennosides-docusate sodium  2 tablet Oral Daily  . sevelamer carbonate  1,600 mg Oral TID WC  . sodium bicarbonate  50 mEq Intravenous Once  . sodium chloride  3 mL Intravenous Q12H  . sodium chloride  3 mL Intravenous Q12H  . warfarin  2.5 mg Oral Daily   sodium chloride, acetaminophen **OR** acetaminophen, albuterol, alum & mag hydroxide-simeth, atropine, bisacodyl, lidocaine (PF), ondansetron **OR** ondansetron  (ZOFRAN) IV, sodium chloride  Assessment/ Plan:  54 y.o. female with past medical history ESRD on HD TTHS, hypertension, congestive heart failure, anemia of CKD, SHPTH, myocardial infarction, hx of CVA, prior episodes of hyperkalemia.  1.  ESRD on HD:  Pt had HD yesterday urgently.  K down to 3.8 now.  No urgent indication for HD at the moment.  Will reassess for HD tomorrow if still here.  2.  Hyperkalemia: K greater than 7.5 upon admission, now down to 3.8, will continue to monitor serum K.  3.  Anemia of CKD: hgb 14.2, epogen to be placed on hold.  4.  SHPTH:  Continue sensipar and renvela, phos high at 8.4.   LOS: 1 [name] 6/1/20168:23 AM

## 2014-11-14 NOTE — Progress Notes (Signed)
Pt transferred to rm 120, pt alert and oriented, no c/o pain or SOB.  All belongings and chart sent with pt.

## 2014-11-14 NOTE — Plan of Care (Signed)
Problem: Discharge Progression Outcomes Goal: Discharge plan in place and appropriate Individualization Pt admitted with elevated potassium 5/31 Transferred from the unit 6/1 Dialysis T, TH, Sat     Goal: Other Discharge Outcomes/Goals Plan of care progress to goal:  Pain: no co pain this shift Hemodynamically: VSS, K improved after diaylsis yesterday Diet: no compliant with diet plan Activity: ambulates without difficulty

## 2014-11-14 NOTE — Progress Notes (Signed)
Initial Nutrition Assessment  DOCUMENTATION CODES:     INTERVENTION:   (Meals and snacks: Cater to pt prefernces)  NUTRITION DIAGNOSIS:  Inadequate oral intake related to altered GI function as evidenced by other (see comment) (nausea times 2 days before admission).    GOAL:  Patient will meet greater than or equal to 90% of their needs    MONITOR:   (Energy intake, electrolyte and renal profile, Anthropmetric)  REASON FOR ASSESSMENT:  Other (Comment) (dialysis pt)    ASSESSMENT:  Pt admitted with weakness, hyperkalemia, requiring urgent dialysis Past Medical History  Diagnosis Date  . Heart failure   . Renal failure   . Hypertension   . Myocardial infarction   . Hyperlipidemia   . Stroke 2011   Electrolyte and Renal Profile:  Recent Labs Lab 11/13/14 1238 11/13/14 1936 11/13/14 2015 11/13/14 2150 11/14/14 0654  BUN 88* 94*  --   --  40*  CREATININE 10.67* 11.07*  --   --  6.83*  NA 136 135  --   --  139  K >7.5*  --  4.9 3.8 5.0  PHOS  --  8.4*  8.6*  --   --   --     Medications: Na bicarbonate at 53ml/hr, sensipar, colace, aspart, renvela, senekot   Unable to complete Nutrition-Focused physical exam at this time.   Pt mumbled poor po intake prior to admission.  Unable to get more out of pt this am.  Noted opened package of graham crackers at bedside.  Unsure if ate breakfast this am.   Height:  Ht Readings from Last 1 Encounters:  11/13/14 5\' 5"  (1.651 m)    Weight:  Wt Readings from Last 1 Encounters:  11/13/14 155 lb 6.8 oz (70.5 kg)   Noted weight loss per encouters, unsure if fluid wt changes  Wt Readings from Last 10 Encounters:  11/13/14 155 lb 6.8 oz (70.5 kg)  11/11/14 156 lb 8.4 oz (71 kg)  10/17/14 166 lb 1.6 oz (75.342 kg)  05/29/13 161 lb (73.029 kg)    BMI:  Body mass index is 25.86 kg/(m^2).  Estimated Nutritional Needs:  Kcal:  BEE 1315 kcals (IF 1.1-1.3, AF 1.3) [id number] kcals/d  Protein:  (1.2-1.5 g/d)  84-105 g/d  Fluid:  (1076ml + urine output)  Skin:  Reviewed, no issues  Diet Order:  Diet renal with fluid restriction Fluid restriction:: 1200 mL Fluid; Room service appropriate?: Yes; Fluid consistency:: Thin  EDUCATION NEEDS:  No education needs identified at this time   Intake/Output Summary (Last 24 hours) at 11/14/14 1107 Last data filed at 11/13/14 2253  Gross per 24 hour  Intake    240 ml  Output   2000 ml  Net  -1760 ml    Last BM: 5/31  MODERATE Care Level Blanche Scovell B. Zenia Resides, [place], [place] (pager)

## 2014-11-15 LAB — STOOL CULTURE: Special Requests: NORMAL

## 2014-11-15 LAB — BASIC METABOLIC PANEL
ANION GAP: 14 (ref 5–15)
BUN: 63 mg/dL — ABNORMAL HIGH (ref 6–20)
CALCIUM: 7.4 mg/dL — AB (ref 8.9–10.3)
CHLORIDE: 96 mmol/L — AB (ref 101–111)
CO2: 28 mmol/L (ref 22–32)
Creatinine, Ser: 8.91 mg/dL — ABNORMAL HIGH (ref 0.44–1.00)
GFR, EST AFRICAN AMERICAN: 5 mL/min — AB (ref >60)
GFR, EST NON AFRICAN AMERICAN: 5 mL/min — AB (ref >60)
Glucose, Bld: 93 mg/dL (ref 65–99)
Potassium: 5 mmol/L (ref 3.5–5.1)
SODIUM: 138 mmol/L (ref 135–145)

## 2014-11-15 LAB — HEPATITIS B CORE ANTIBODY, TOTAL: Hep B Core Total Ab: NEGATIVE

## 2014-11-15 LAB — PROTIME-INR
INR: 3.89
PROTHROMBIN TIME: 38.1 s — AB (ref 11.4–15.0)

## 2014-11-15 LAB — MISC LABCORP TEST (SEND OUT)
Labcorp test code: 6395
Labcorp test code: 6610

## 2014-11-15 LAB — CBC
HCT: 37.9 % (ref 35.0–47.0)
Hemoglobin: 11.7 g/dL — ABNORMAL LOW (ref 12.0–16.0)
MCH: 26.2 pg (ref 26.0–34.0)
MCHC: 30.9 g/dL — AB (ref 32.0–36.0)
MCV: 85 fL (ref 80.0–100.0)
Platelets: 130 10*3/uL — ABNORMAL LOW (ref 150–440)
RBC: 4.46 MIL/uL (ref 3.80–5.20)
RDW: 21.9 % — AB (ref 11.5–14.5)
WBC: 4.2 10*3/uL (ref 3.6–11.0)

## 2014-11-15 NOTE — Progress Notes (Signed)
ANTICOAGULATION CONSULT NOTE - FOLLOW UP  Pharmacy Consult for Warfarin Indication: VTE prophylaxis  Allergies  Allergen Reactions  . Morphine And Related Hives  . Adhesive [Tape] Rash  . Latex Rash    Patient Measurements: Height: 5\' 5"  (165.1 cm) Weight: 168 lb 12.8 oz (76.567 kg) IBW/kg (Calculated) : 57 Heparin Dosing Weight:   Vital Signs: Temp: 98.6 F (37 C) (06/02 0510) Temp Source: Oral (06/02 0510) BP: 109/60 mmHg (06/02 0510) Pulse Rate: 73 (06/02 0510)  Labs:  Recent Labs  11/13/14 1238 11/13/14 1936 11/13/14 2150 11/14/14 0654 11/15/14 0612  HGB 14.2 14.2  --   --  11.7*  HCT 47.8* 47.1*  --   --  37.9  PLT 183 169  --   --  130*  LABPROT 31.0*  --   --  40.3* 38.1*  INR 2.98  --   --  4.17* 3.89  CREATININE 10.67* 11.07*  --  6.83* 8.91*  TROPONINI  --  0.06* 0.09* 0.09*  --     Estimated Creatinine Clearance: 7.6 mL/min (by C-G formula based on Cr of 8.91).   Medical History: Past Medical History  Diagnosis Date  . Heart failure   . Renal failure   . Hypertension   . Myocardial infarction   . Hyperlipidemia   . Stroke 2011    Medications:  Prescriptions prior to admission  Medication Sig Dispense Refill Last Dose  . amiodarone (PACERONE) 200 MG tablet Take 200 mg by mouth daily.   11/12/2014 at Unknown time  . amitriptyline (ELAVIL) 25 MG tablet Take 25 mg by mouth at bedtime.   11/12/2014 at Unknown time  . aspirin 81 MG tablet Take 81 mg by mouth daily.   11/12/2014 at Unknown time  . docusate sodium (COLACE) 100 MG capsule Take 100 mg by mouth 2 (two) times daily.   11/12/2014 at Unknown time  . hydrOXYzine (ATARAX/VISTARIL) 25 MG tablet Take 1 tablet by mouth 3 (three) times daily.   11/12/2014 at Unknown time  . midodrine (PROAMATINE) 10 MG tablet Take 10 mg by mouth 1 day or 1 dose.   11/12/2014 at Unknown time  . pantoprazole (PROTONIX) 40 MG tablet Take 1 tablet by mouth daily.   11/12/2014 at Unknown time  . sennosides-docusate  sodium (SENOKOT-S) 8.6-50 MG tablet Take 2 tablets by mouth daily.   11/12/2014 at Unknown time  . SENSIPAR 30 MG tablet Take 1 tablet by mouth daily.   11/12/2014 at Unknown time  . sevelamer carbonate (RENVELA) 800 MG tablet Take 1,600 mg by mouth 3 (three) times daily with meals.   11/12/2014 at Unknown time  . warfarin (COUMADIN) 2.5 MG tablet Take 2.5 mg by mouth daily.   11/12/2014 at 1830    Assessment: 5/31:  INR = 2.98 6/1: INR = 4.17 6/2: INR = 3.89  Pt was on warfarin 2.5 mg PO daily at home, hx of CVA.  Goal of Therapy:  INR 2-3  Plan:   Continue to hold warfarin. Recheck INR on 6/3 with AM labs.   Skip Mayer, PharmD  11/15/2014,8:20 AM

## 2014-11-15 NOTE — Plan of Care (Signed)
Problem: Discharge Progression Outcomes Goal: Discharge plan in place and appropriate Individualization  Individualization Pt admitted with elevated potassium 5/31 Transferred from the unit 6/1 Dialysis T, TH, Sat Goal: Other Discharge Outcomes/Goals Outcome: Progressing Plan of care progress to goals: BP/HR stable. Denies pain. Potassium is 5.0. Ambulate without difficulty.

## 2014-11-15 NOTE — Discharge Summary (Signed)
[place] [facility] [place] NAME: [name]    MR#:  [id number]  DATE OF BIRTH:  [birth year]  DATE OF ADMISSION:  11/13/2014 ADMITTING PHYSICIAN: Hillary Bow, MD  DATE OF DISCHARGE: 11/15/2014  PRIMARY CARE PHYSICIAN: Marden Noble, MD    ADMISSION DIAGNOSIS:  Hyperkalemia, diminished renal excretion [E87.5]  DISCHARGE DIAGNOSIS:  Active Problems:   Retrosternal chest pain   Generalized weakness   ESRD on hemodialysis   Hyperkalemia   SECONDARY DIAGNOSIS:   Past Medical History  Diagnosis Date  . Heart failure   . Renal failure   . Hypertension   . Myocardial infarction   . Hyperlipidemia   . Stroke 2011    HOSPITAL COURSE:   1. severe hyperkalemia with EKG abnormalities- improved quickly with emergent dialysis. Patient will have dialysis on 11/15/2014 prior to going home. I spoke with Dr. Holley Raring nephrology, he believes that the hyperkalemia is dietary in nature. He will have the nutritionist at the dialysis center speak with the patient about not eating high potassium containing foods. 2. Chest pain- resolved with lowering of potassium. 3. Acute on chronic diastolic congestive heart failure-likely chronic in nature. Lungs are clear upon discharge home. We are limited with medications secondary to hypotension and hyperkalemia. Since the patient does not urinate dialysis is the only way to manage fluid. 4. Chronic respiratory failure- wears oxygen at home. 5. End-stage renal disease on dialysis Tuesday Thursday and Saturday. Needs to be compliant with dialysis in order to manage potassium and fluid. 6. Arrhythmia history on amiodarone and Coumadin. 7. Gastroesophageal reflux disease without esophagitis on Protonix.  DISCHARGE CONDITIONS:   Satisfactory  CONSULTS OBTAINED:    nephrology  DRUG ALLERGIES:   Allergies  Allergen Reactions  . Morphine And Related Hives  . Adhesive [Tape] Rash  . Latex Rash     DISCHARGE MEDICATIONS:   Current Discharge Medication List    CONTINUE these medications which have NOT CHANGED   Details  amiodarone (PACERONE) 200 MG tablet Take 200 mg by mouth daily.    amitriptyline (ELAVIL) 25 MG tablet Take 25 mg by mouth at bedtime.    aspirin 81 MG tablet Take 81 mg by mouth daily.    docusate sodium (COLACE) 100 MG capsule Take 100 mg by mouth 2 (two) times daily.    hydrOXYzine (ATARAX/VISTARIL) 25 MG tablet Take 1 tablet by mouth 3 (three) times daily.    midodrine (PROAMATINE) 10 MG tablet Take 10 mg by mouth 1 day or 1 dose.    pantoprazole (PROTONIX) 40 MG tablet Take 1 tablet by mouth daily.    sennosides-docusate sodium (SENOKOT-S) 8.6-50 MG tablet Take 2 tablets by mouth daily.    SENSIPAR 30 MG tablet Take 1 tablet by mouth daily.    sevelamer carbonate (RENVELA) 800 MG tablet Take 1,600 mg by mouth 3 (three) times daily with meals.    warfarin (COUMADIN) 2.5 MG tablet Take 2.5 mg by mouth daily.         DISCHARGE INSTRUCTIONS:   Follow up with dialysis Tuesday Thursday and Saturday. Follow-up with Dr. Brynda Greathouse in 1 week  If you experience worsening of your admission symptoms, develop shortness of breath, life threatening emergency, suicidal or homicidal thoughts you must seek medical attention immediately by calling 911 or calling your MD immediately  if symptoms less severe.  You Must read complete instructions/literature along with all the possible adverse reactions/side effects for all the Medicines you take  and that have been prescribed to you. Take any new Medicines after you have completely understood and accept all the possible adverse reactions/side effects.   Please note  You were cared for by a hospitalist during your hospital stay. If you have any questions about your discharge medications or the care you received while you were in the hospital after you are discharged, you can call the unit and asked to speak with the  hospitalist on call if the hospitalist that took care of you is not available. Once you are discharged, your primary care physician will handle any further medical issues. Please note that NO REFILLS for any discharge medications will be authorized once you are discharged, as it is imperative that you return to your primary care physician (or establish a relationship with a primary care physician if you do not have one) for your aftercare needs so that they can reassess your need for medications and monitor your lab values.    Today   CHIEF COMPLAINT:   Chief Complaint  Patient presents with  . Nausea  . Emesis  . Diarrhea  . Numbness    bilteral lower legs    HISTORY OF PRESENT ILLNESS:  [name]  is a 54 y.o. female with a known history of end-stage renal disease, diastolic congestive heart failure. She presented with weakness and chest pain and found to have severe hyperkalemia. This is similar to her previous admission.   VITAL SIGNS:  Blood pressure 109/60, pulse 73, temperature 98.6 F (37 C), temperature source Oral, resp. rate 20, height 5\' 5"  (1.651 m), weight 76.567 kg (168 lb 12.8 oz), SpO2 100 %.  I/O:   Intake/Output Summary (Last 24 hours) at 11/15/14 0832 Last data filed at 11/14/14 1800  Gross per 24 hour  Intake    240 ml  Output      0 ml  Net    240 ml    PHYSICAL EXAMINATION:  GENERAL:  54 y.o.-year-old patient lying in the bed with no acute distress.  EYES: Pupils equal, round, reactive to light and accommodation. No scleral icterus. Extraocular muscles intact.  HEENT: Head atraumatic, normocephalic. Oropharynx and nasopharynx clear.  NECK:  Supple, no jugular venous distention. No thyroid enlargement, no tenderness.  LUNGS: Normal breath sounds bilaterally, no wheezing, rales,rhonchi or crepitation. No use of accessory muscles of respiration.  CARDIOVASCULAR: S1, S2 normal. 2/6 systolic ejection murmur, no rubs, or gallops.  ABDOMEN: Soft,  non-tender, non-distended. Bowel sounds present. No organomegaly or mass.  EXTREMITIES: No pedal edema, cyanosis, or clubbing.  NEUROLOGIC: Cranial nerves II through XII are intact. Muscle strength 5/5 in all extremities. Sensation intact. Gait not checked.  PSYCHIATRIC: The patient is alert and oriented x 3.  SKIN: No obvious rash, lesion, or ulcer.   DATA REVIEW:   CBC  Recent Labs Lab 11/15/14 0612  WBC 4.2  HGB 11.7*  HCT 37.9  PLT 130*    Chemistries   Recent Labs Lab 11/13/14 1238  11/15/14 0612  NA 136  < > 138  K >7.5*  < > 5.0  CL 96*  < > 96*  CO2 20*  < > 28  GLUCOSE 66  < > 93  BUN 88*  < > 63*  CREATININE 10.67*  < > 8.91*  CALCIUM 8.4*  < > 7.4*  AST 79*  --   --   ALT 68*  --   --   ALKPHOS 128*  --   --   BILITOT  0.7  --   --   < > = values in this interval not displayed.  Cardiac Enzymes  Recent Labs Lab 11/14/14 0654  TROPONINI 0.09*    Microbiology Results  Results for orders placed or performed during the hospital encounter of 11/13/14  C difficile quick scan w PCR reflex [facility])     Status: None   Collection Time: 11/13/14  2:23 PM  Result Value Ref Range Status   C Diff antigen NEGATIVE  Final   C Diff toxin NEGATIVE  Final   C Diff interpretation NEGATIVE  Final    RADIOLOGY:  Dg Chest 2 View  11/14/2014   CLINICAL DATA:  Fever  EXAM: CHEST  2 VIEW  COMPARISON:  08/02/2014  FINDINGS: Prior CABG. Cardiomegaly. Left basilar airspace opacity could reflect atelectasis or infiltrate. No confluent opacity on the right. No effusions. No acute bony abnormality.  IMPRESSION: Cardiomegaly.  Left lower lobe atelectasis or pneumonia.   Electronically Signed   By: Rolm Baptise M.D.   On: 11/14/2014 12:36    Management plans discussed with the patient, and nephrology and they are in agreement.  CODE STATUS:     Code Status Orders        Start     Ordered   11/13/14 1658  Full code   Continuous     11/13/14 1659      TOTAL TIME TAKING  CARE OF THIS PATIENT: 40 minutes.    Loletha Grayer M.D on 11/15/2014 at 8:32 AM  Between 7am to 6pm - Pager - [phone]  After 6pm go to [email or web address] - password [email or web address] [facility]  Office  [phone]  CC: Primary care physician; Marden Noble, MD

## 2014-11-15 NOTE — Progress Notes (Signed)
Nutrition Education Note  RD received verbal request for education on potassium foods. Provided 'Potassium Content of Foods' to patient/family. List including High Potassium Foods, Moderate potassium foods and Low potassium foods. Provided specific recommendations on safer alternatives of these foods.  Reviewed recommended serving sizes specifically determined for patient's current nutritional status.   Explained why diet restrictions are needed and provided lists of foods to limit/avoid that are high potassium.  Strongly encouraged compliance of this diet.   Encouraged pt to discuss specific diet questions/concerns with RD at HD outpatient facility. Teach back method used.  Expect good compliance.  Body mass index is 28.09 kg/(m^2).  Current diet order is renal diet with fluid restriction, patient is consuming approximately 100% of meals at this time. Labs and medications reviewed. No further nutrition interventions warranted at this time secondary to discharge. RD contact information provided. If additional nutrition issues arise, please re-consult RD.  Dwyane Luo, [place], LDN Pager [phone]

## 2014-11-15 NOTE — Clinical Documentation Improvement (Signed)
Presents with Hyperkalemia, has ESRD, Chronic Diastolic CHF; missed dialysis due to Nausea, Vomiting and Diarrhea.   "Congestive heart failure (CHF), Diastolic - this is a chronic problem, potentially acutely exacerbated by fluid overload due to missing dialysis.on renal fluid restricted diet".  Please clarify if patient was indeed in fluid overload and Acute on Chronic Diastolic CHF exacerbation ruled in or has ruled out. Please document findings in next progress note and include in discharge summary if applicable.   No BNP drawn  Not on any IV Diuretics  Treatment Plan was to take patient for emergent Dialysis   Other Condition________________________________________ Cannot Clinically Determine  Thank You, Zoila Shutter ,RN Clinical Documentation Specialist:  [facility]

## 2014-11-15 NOTE — Progress Notes (Signed)
All discharge instructions reviewed with patient and patient signed.  Patient discharged home via personal vehicle by Mother with 77.

## 2014-11-15 NOTE — Discharge Instructions (Signed)
Hyperkalemia Hyperkalemia means you have too much potassium in your blood. Potassium is a type of salt in the blood (electrolyte). Normally, your kidneys remove potassium from the body. Too much potassium can be life-threatening. HOME CARE  Only take medicine as told by your doctor.  Do not take vitamins or natural products unless your doctor says they are okay.  Keep all doctor visits as told.  Follow diet instructions as told by your doctor. GET HELP RIGHT AWAY IF:  Your heartbeat is not regular or very slow.  You feel dizzy (lightheaded).  You feel weak.  You are short of breath.  You have chest pain.  You pass out (faint). MAKE SURE YOU:   Understand these instructions.  Will watch your condition.  Will get help right away if you are not doing well or get worse. Document Released: 06/01/2005 Document Revised: 08/24/2011 Document Reviewed: 09/06/2013 [facility] Patient Information 2015 [place], [place]. This information is not intended to replace advice given to you by your health care provider. Make sure you discuss any questions you have with your health care provider.

## 2014-11-15 NOTE — Progress Notes (Signed)
[facility]  ROUNDING NOTE   Subjective:  Came in with hyperkalemia. Now improved, K 5.0.   Pt seen during HD today. Tolerating well.   Objective:  Vital signs in last 24 hours:  Temp:  [97.6 F (36.4 C)-99.8 F (37.7 C)] 97.6 F (36.4 C) (06/02 1040) Pulse Rate:  [69-86] 74 (06/02 1200) Resp:  [12-20] 12 (06/02 1200) BP: (98-128)/(58-100) 128/90 mmHg (06/02 1200) SpO2:  [90 %-100 %] 100 % (06/02 1040) Weight:  [73 kg (160 lb 15 oz)-76.567 kg (168 lb 12.8 oz)] 73 kg (160 lb 15 oz) (06/02 1030)  Weight change: 4.899 kg (10 lb 12.8 oz) Filed Weights   11/13/14 2253 11/15/14 0500 11/15/14 1030  Weight: 70.5 kg (155 lb 6.8 oz) 76.567 kg (168 lb 12.8 oz) 73 kg (160 lb 15 oz)    Intake/Output: I/O last 3 completed shifts: In: 480 [P.O.:480] Out: 2000 [Other:2000]   Intake/Output this shift:     Physical Exam: General: NAD  Head: Normocephalic, atraumatic. Moist oral mucosal membranes  Eyes: Anicteric  Neck: Supple, trachea midline  Lungs:  Clear to auscultation normal effort  Heart: Regular rate and rhythm S1S2  Abdomen:  Soft, nontender, BS present  Extremities: trace peripheral edema.  Neurologic: Nonfocal, moving all four extremities  Skin: No lesions  Access: LUE AVF    Basic Metabolic Panel:  Recent Labs Lab 11/13/14 1238 11/13/14 1936 11/13/14 2015 11/13/14 2150 11/14/14 0654 11/15/14 0612  NA 136 135  --   --  139 138  K >7.5*  --  4.9 3.8 5.0 5.0  CL 96* 97*  --   --  95* 96*  CO2 20* 15*  --   --  30 28  GLUCOSE 66 100*  --   --  74 93  BUN 88* 94*  --   --  40* 63*  CREATININE 10.67* 11.07*  --   --  6.83* 8.91*  CALCIUM 8.4* 8.2*  --   --  7.8* 7.4*  PHOS  --  8.4*  8.6*  --   --   --   --     Liver Function Tests:  Recent Labs Lab 11/13/14 1238 11/13/14 1936  AST 79*  --   ALT 68*  --   ALKPHOS 128*  --   BILITOT 0.7  --   PROT 9.1*  --   ALBUMIN 4.0 3.6   No results for input(s): LIPASE, AMYLASE in the last 168  hours. No results for input(s): AMMONIA in the last 168 hours.  CBC:  Recent Labs Lab 11/13/14 1238 11/13/14 1936 11/15/14 0612  WBC 5.2 4.8 4.2  NEUTROABS 3.2  --   --   HGB 14.2 14.2 11.7*  HCT 47.8* 47.1* 37.9  MCV 85.7 85.5 85.0  PLT 183 169 130*    Cardiac Enzymes:  Recent Labs Lab 11/13/14 1936 11/13/14 2150 11/14/14 0654  TROPONINI 0.06* 0.09* 0.09*    BNP: Invalid input(s): POCBNP  CBG:  Recent Labs Lab 11/13/14 2005 11/13/14 2046  GLUCAP 65* 83    Microbiology: Results for orders placed or performed during the hospital encounter of 11/13/14  C difficile quick scan w PCR reflex [facility])     Status: None   Collection Time: 11/13/14  2:23 PM  Result Value Ref Range Status   C Diff antigen NEGATIVE  Final   C Diff toxin NEGATIVE  Final   C Diff interpretation NEGATIVE  Final  Stool culture     Status: None  Collection Time: 11/13/14  2:23 PM  Result Value Ref Range Status   Specimen Description STOOL  Final   Special Requests Normal  Final   Culture   Final    NO SALMONELLA OR SHIGELLA ISOLATED NO CAMPYLOBACTER DETECTED No Pathogenic E. coli detected    Report Status 11/15/2014 FINAL  Final    Coagulation Studies:  Recent Labs  11/13/14 1238 11/14/14 0654 11/15/14 0612  LABPROT 31.0* 40.3* 38.1*  INR 2.98 4.17* 3.89    Urinalysis: No results for input(s): COLORURINE, LABSPEC, PHURINE, GLUCOSEU, HGBUR, BILIRUBINUR, KETONESUR, PROTEINUR, UROBILINOGEN, NITRITE, LEUKOCYTESUR in the last 72 hours.  Invalid input(s): APPERANCEUR    Imaging: Dg Chest 2 View  11/14/2014   CLINICAL DATA:  Fever  EXAM: CHEST  2 VIEW  COMPARISON:  08/02/2014  FINDINGS: Prior CABG. Cardiomegaly. Left basilar airspace opacity could reflect atelectasis or infiltrate. No confluent opacity on the right. No effusions. No acute bony abnormality.  IMPRESSION: Cardiomegaly.  Left lower lobe atelectasis or pneumonia.   Electronically Signed   By: Rolm Baptise M.D.   On:  11/14/2014 12:36     Medications:     . amiodarone  200 mg Oral Daily  . amitriptyline  25 mg Oral QHS  . aspirin EC  81 mg Oral Daily  . cinacalcet  30 mg Oral Daily  . dextrose  1 ampule Intravenous Once  . docusate sodium  100 mg Oral BID  . hydrOXYzine  25 mg Oral TID  . insulin aspart  5 Units Intravenous Once  . midodrine  10 mg Oral 1 day or 1 dose  . pantoprazole  40 mg Oral Daily  . sennosides-docusate sodium  2 tablet Oral Daily  . sevelamer carbonate  1,600 mg Oral TID WC  . sodium bicarbonate  50 mEq Intravenous Once   acetaminophen **OR** acetaminophen, albuterol, alum & mag hydroxide-simeth, atropine, bisacodyl, lidocaine (PF), ondansetron **OR** ondansetron (ZOFRAN) IV    Eleuterio Dollar 6/2/201612:19 PM   Assessment/ Plan:  54 y.o. female with past medical history ESRD on HD TTHS, hypertension, congestive heart failure, anemia of CKD, SHPTH, myocardial infarction, hx of CVA, prior episodes of hyperkalemia.  1.  ESRD on HD:  Pt seen during HD today, tolerating well, UF target 2kg. BFR 400, DFR 600.  2.  Hyperkalemia: K down to 5, being dialyzed against a 2k bath today.  3.  Anemia of CKD: hgb down to 11.7, will continue to hold epogen for now.  4.  SHPTH:  Pt with hx of dietary nonadherence, will conitnue on sensipar and renvela, continue to monitor phos/ca/pth as outpt.   LOS: 2 [name] 6/2/201612:18 PM

## 2014-11-19 LAB — CULTURE, BLOOD (ROUTINE X 2)
CULTURE: NO GROWTH
Culture: NO GROWTH

## 2014-12-07 IMAGING — US ABDOMEN ULTRASOUND
1 series · 13 of 25 positions shown · non-contrast
Comparison: none

REASON FOR EXAM: Elevated LFTs
COMMENTS:

PROCEDURE:     US  - US ABDOMEN GENERAL SURVEY  - December 23, 2012  [DATE]
RESULT:

[Series 1: abdomen ultrasound · 0.28mm/px · 13 of 105 slices shown]
[im 1/105]
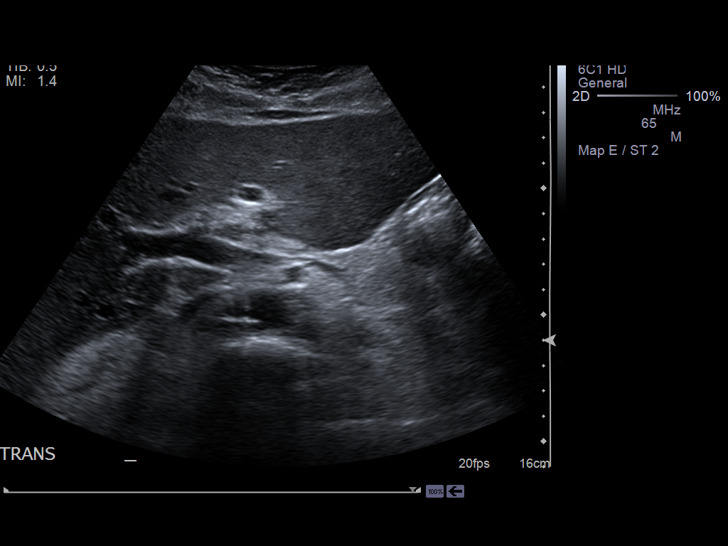
[im 9/105]
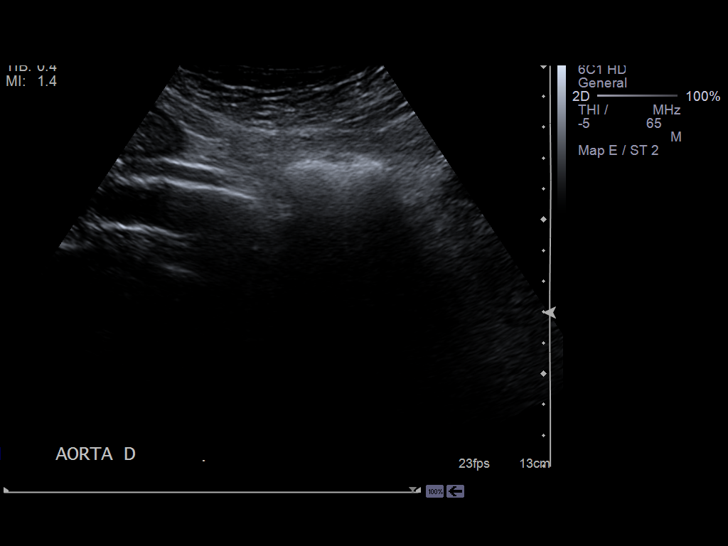
[im 18/105]
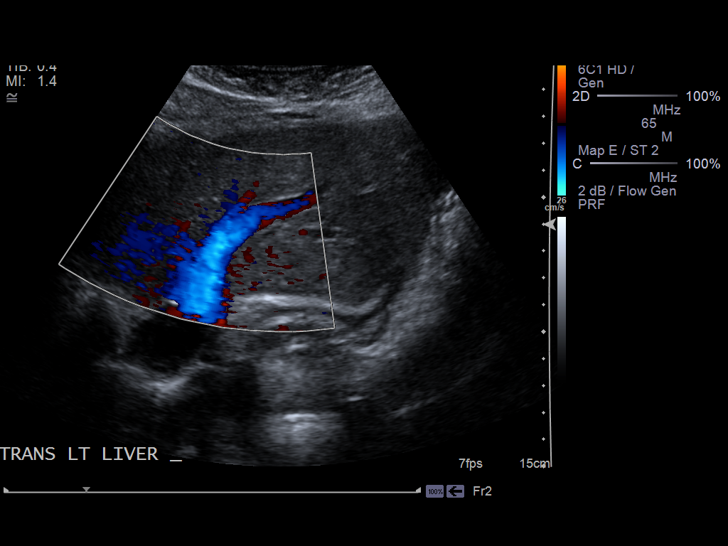
[im 27/105]
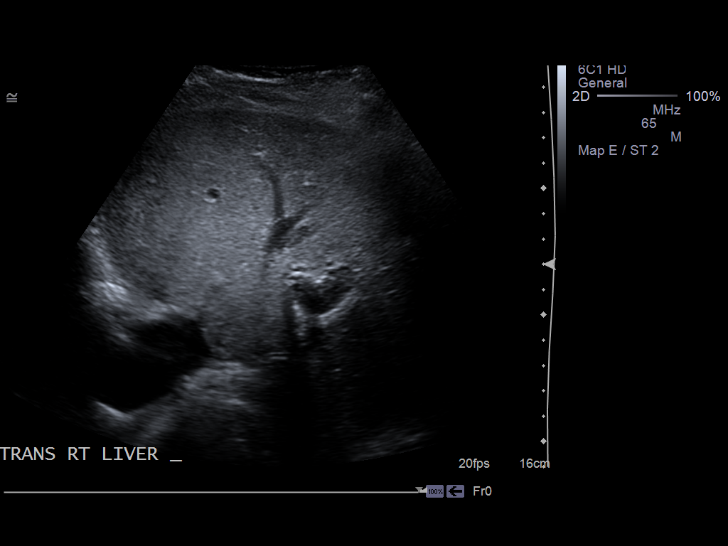
[im 35/105]
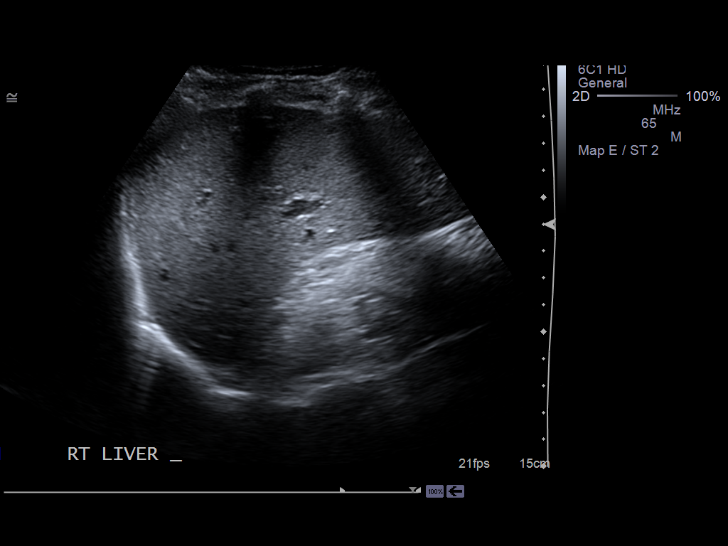
[im 44/105]
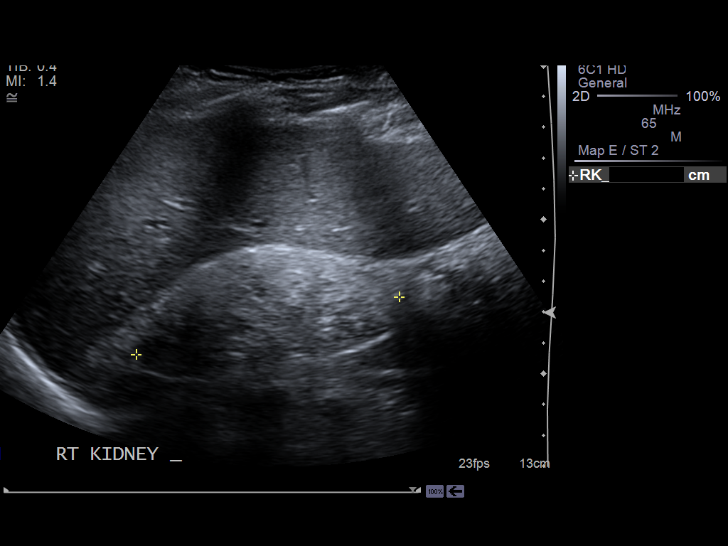
[im 53/105]
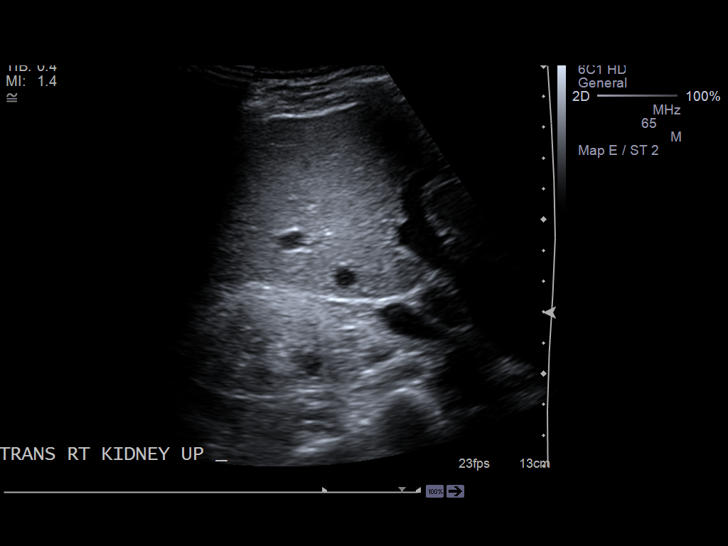
[im 61/105]
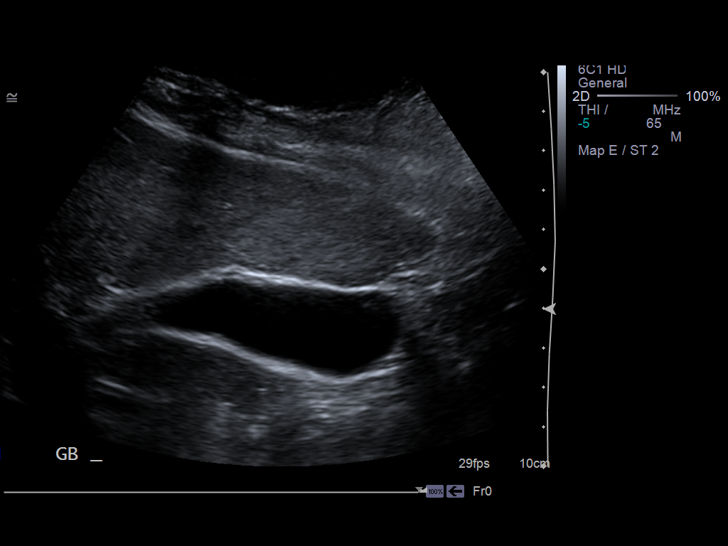
[im 70/105]
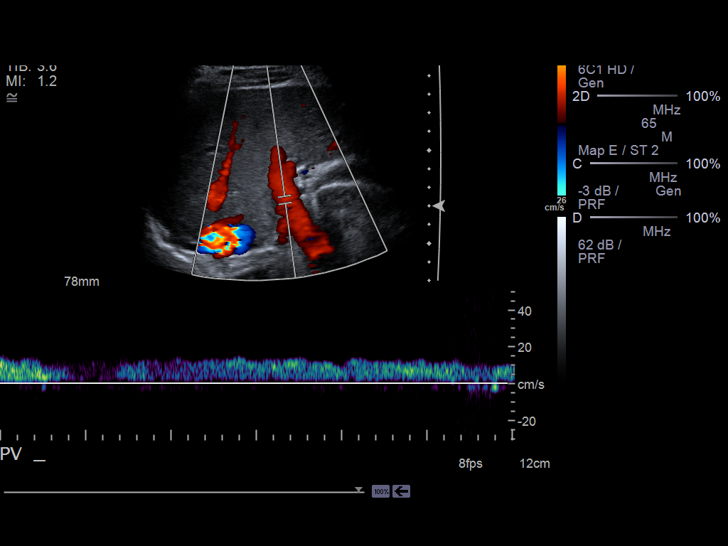
[im 79/105]
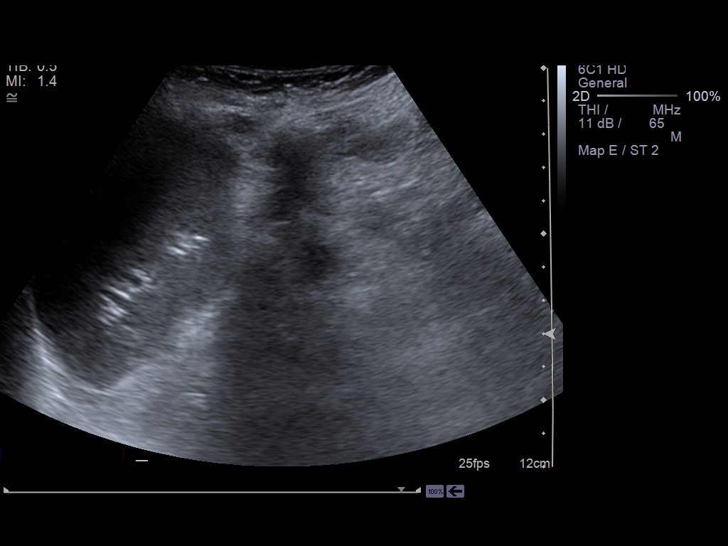
[im 87/105]
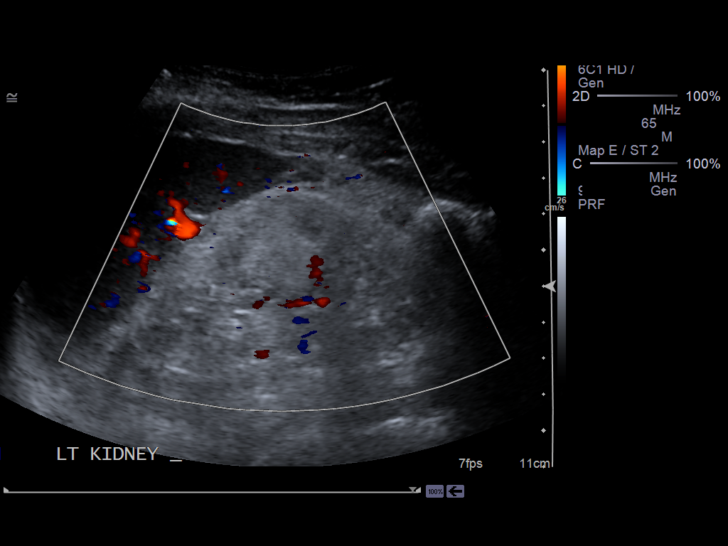
[im 96/105]
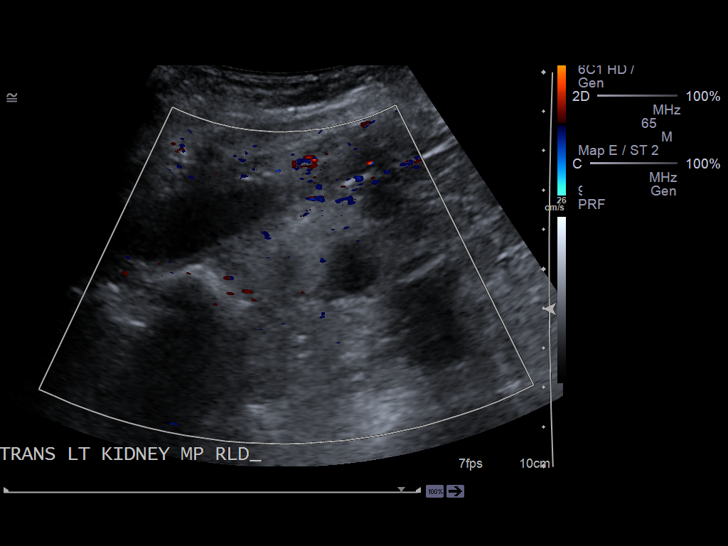
[im 105/105]
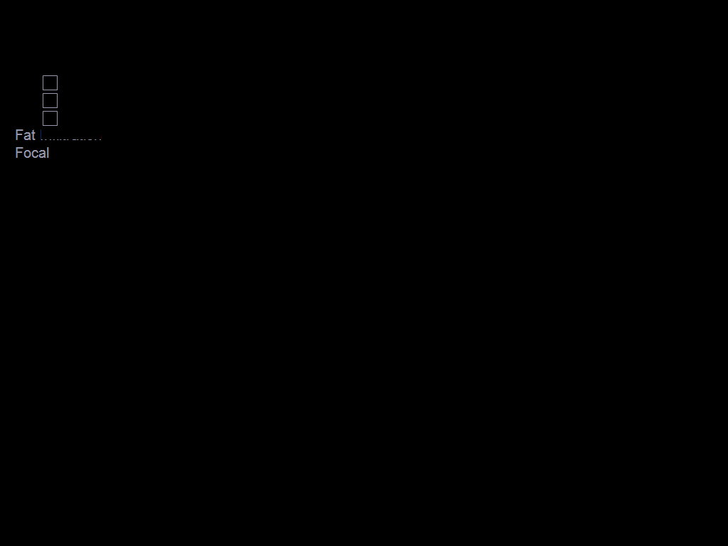

[13 of 25 positions shown; findings below may reference images not displayed]

FINDINGS: The liver demonstrates a homogeneous echotexture measuring
cm in longitudinal dimensions along the midclavicular line. Hepatopetal flow
is identified within the portal vein. The aorta and IVC are unremarkable.

The pancreas is partially visualized and unremarkable. The spleen is
homogeneous in echotexture and measures 9.7 cm in longitudinal dimensions.
Evaluation of the gallbladder fossa demonstrates two echogenic foci with
acoustic shadowing which appear to be consistent with gallstones. These
areas do not appear to be mobile. The largest measures 3.1 mm. There is no
evidence of pericholecystic fluid nor a sonographic Murphy's sign.
Gallbladder wall thickness is 1.5 mm. There is no evidence of intrahepatic
or extrahepatic biliary ductal dilatation. There is no evidence of a
sonographic Murphy's sign.

The right kidney measures 8.74 x 3.91 x 4.12 cm and the left kidney measures
8.87 x 4.43 x 4.16 cm. There is decreased to nearly absent corticomedullary
differentiation without evidence of hydronephrosis. Bilateral renal cysts
are identified on the right measuring 1.25 x 0.6 x 0.76 cm and on the left
1.84 x 1.61 x 1.5 cm.
IMPRESSION: 1. Nonmobile gallstones.
2. Findings consistent with the patient's history of endstage renal disease.
3. No further sonographic abnormalities.

## 2014-12-10 IMAGING — MG MM CAD SCREENING MAMMO
1 series · 4 of 4 positions shown · non-contrast
Comparison: none

REASON FOR EXAM: SCR MAMMO NO ORDER
COMMENTS:

PROCEDURE:     MAM - MAM DGTL SCRN MAM NO ORDER W/CAD  - December 26, 2012 [DATE]
RESULT:     No prior studies available for comparison. No mass. Benign
calcifications. CAD evaluation is nonfocal.

[R CC · right · 4 of 4 slices shown]
[im 1/4]
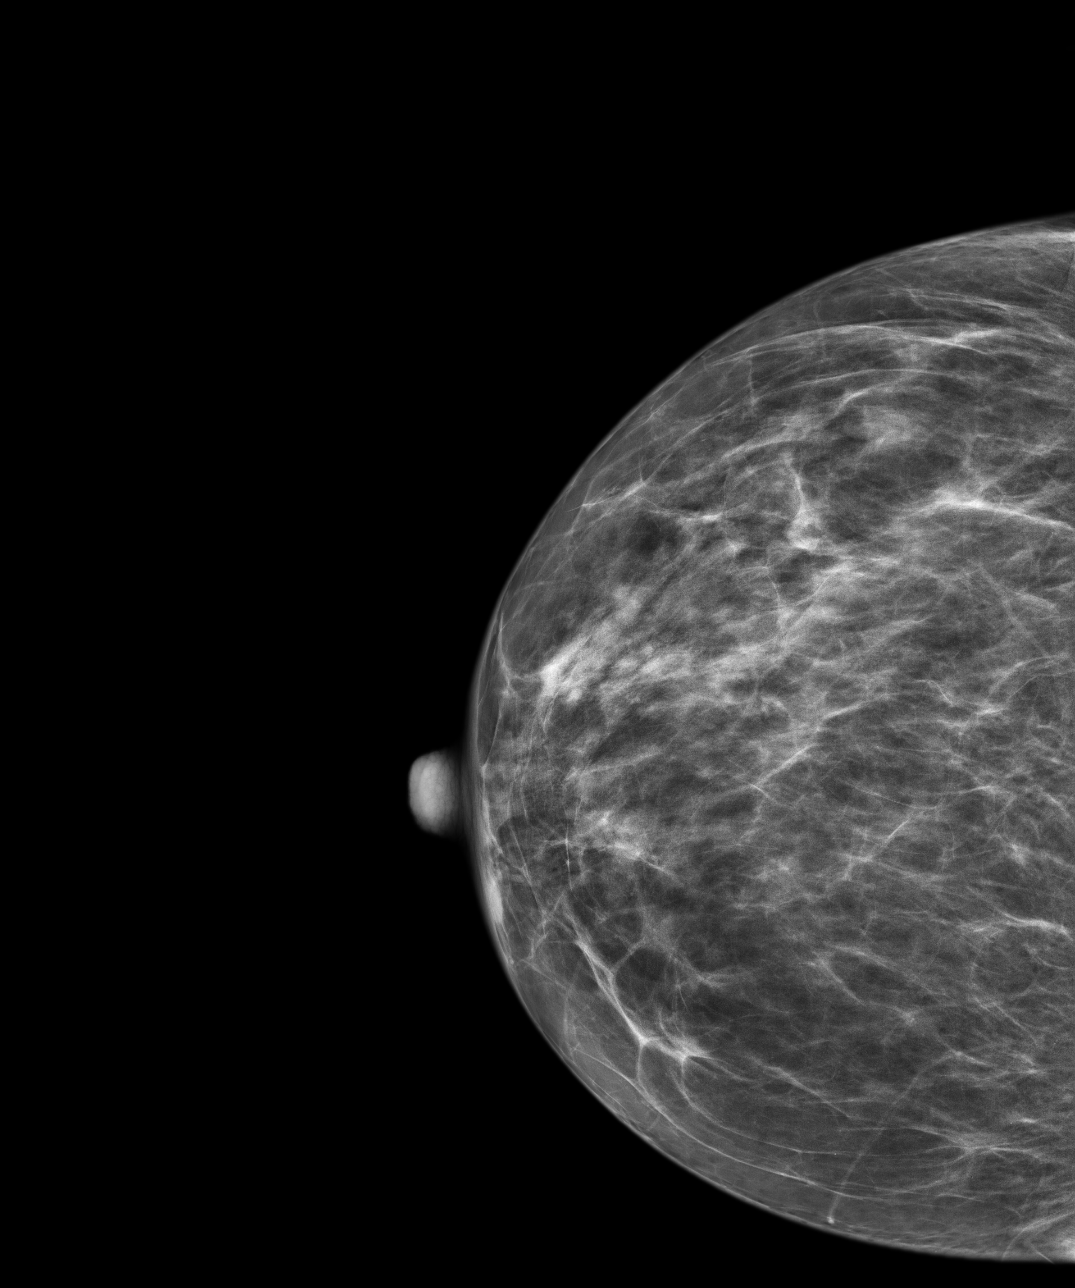
[im 2/4]
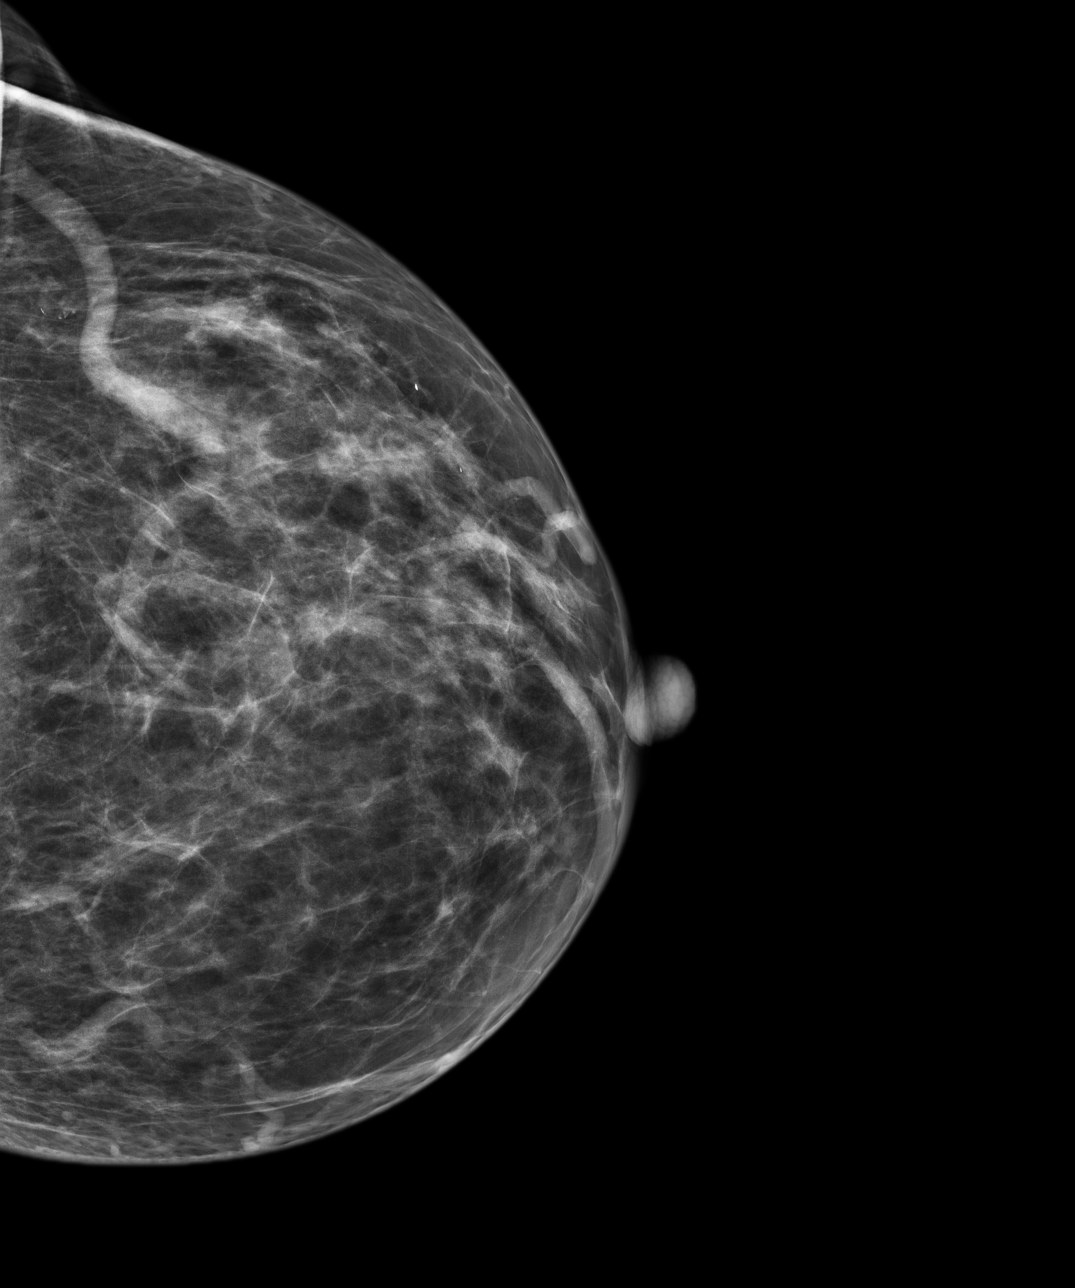
[im 3/4]
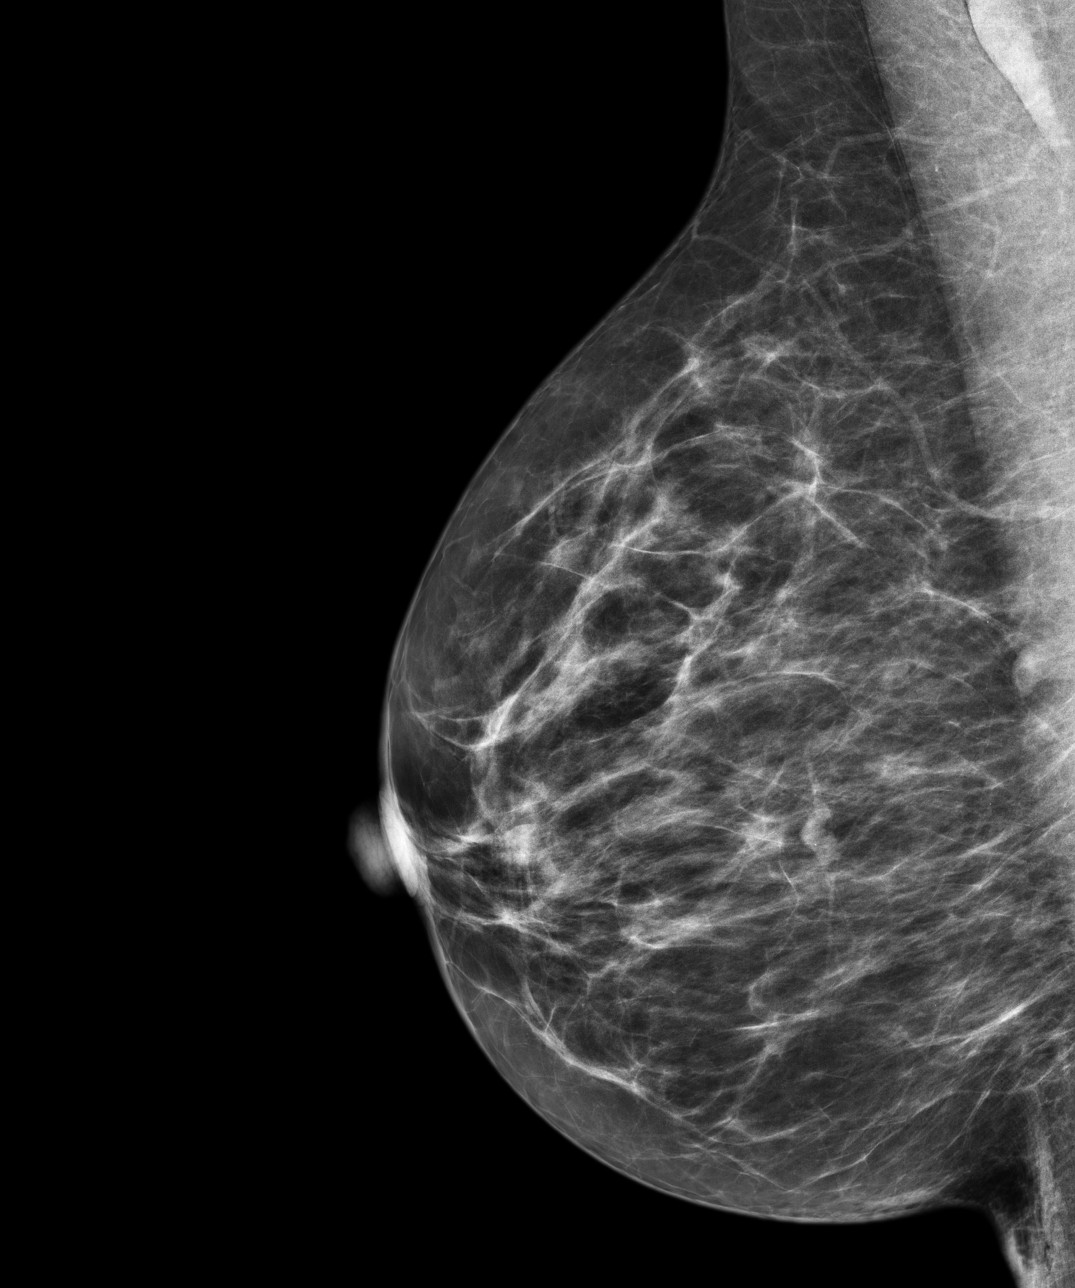
[im 4/4]
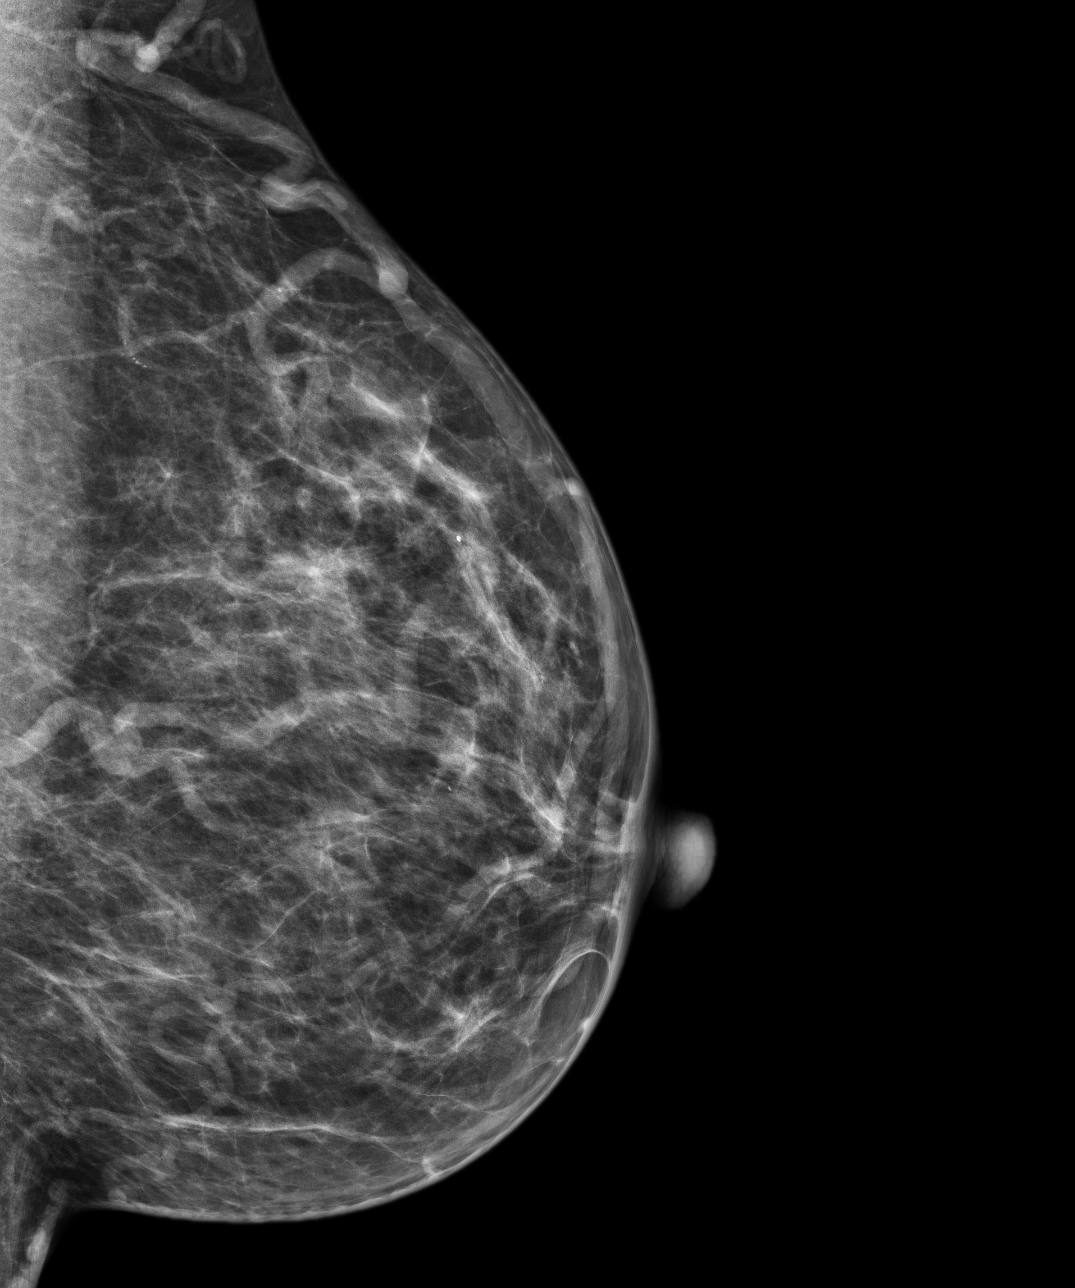

[4 of 4 positions shown; findings below may reference images not displayed]

IMPRESSION: Benign exam. Yearly followup mammogram suggested.

BI-RADS: Category 2- Benign Finding

A NEGATIVE MAMMOGRAM REPORT DOES NOT PRECLUDE BIOPSY OR OTHER EVALUATION OF
A CLINICALLY PALPABLE OR OTHERWISE SUSPICIOUS MASS OR LESION. BREAST CANCER
MAY NOT BE DETECTED IN UP TO 10% OF CASES.

Thank you for the oppurtunity to contribute to the care of your patient.
BREAST COMPOSITION: The breast composition is HETEROGENEOUSLY DENSE
(glandular tissue is 51-75%) This may decrease the sensitivity of
mammography.

## 2015-01-14 IMAGING — XA IR VASCULAR PROCEDURE
8 series · 14 of 14 positions shown · non-contrast
Comparison: none

[Series 1: care upper arm · 2 of 2 slices shown (1 of 6)]
[im 1/2]
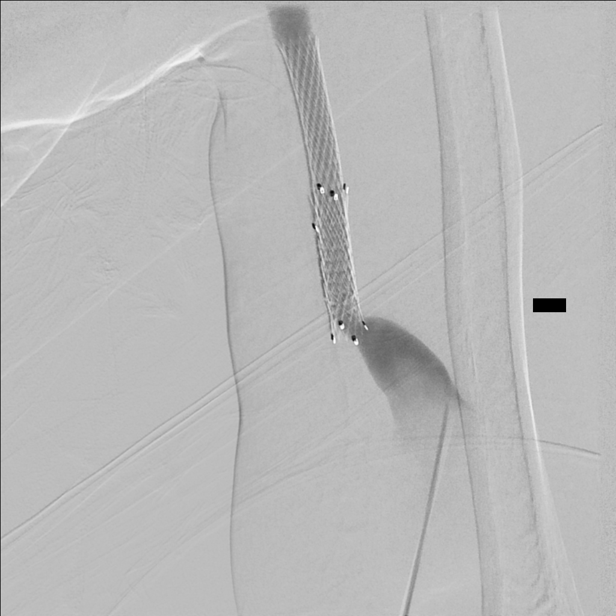
[im 2/2]
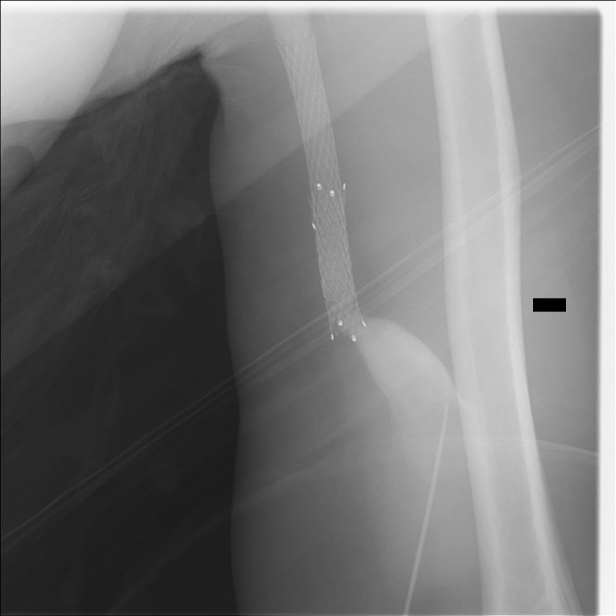

[Series 3: care upper arm · 2 of 2 slices shown (2 of 6)]
[im 1/2]
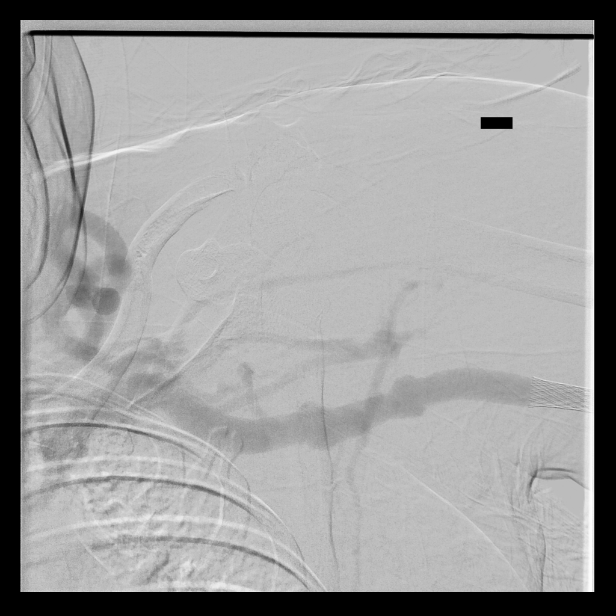
[im 2/2]
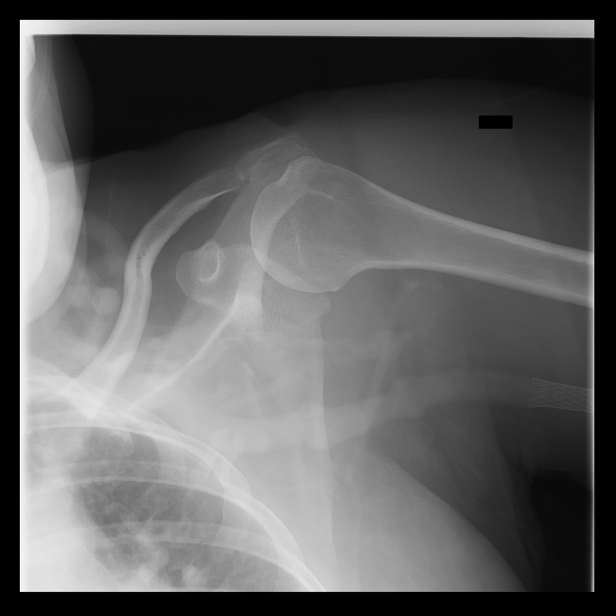

[Series 5: care upper arm · 2 of 2 slices shown (3 of 6)]
[im 1/2]
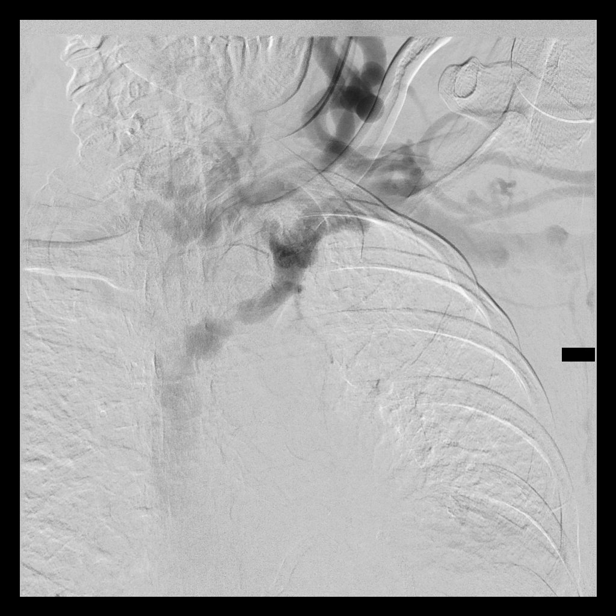
[im 2/2]
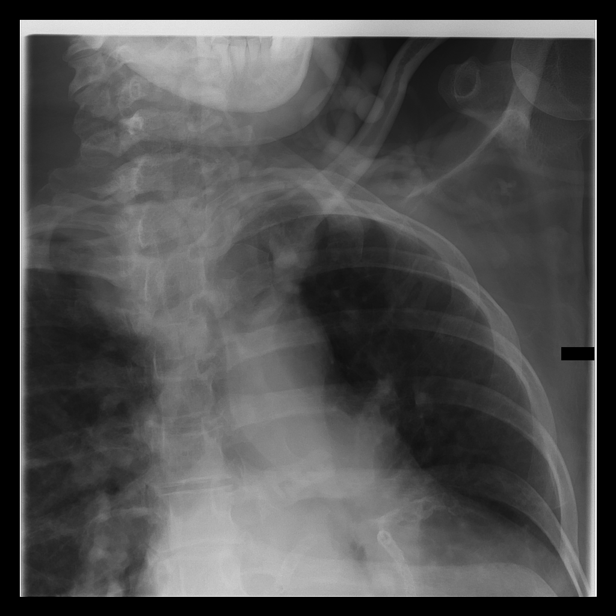

[Series 7: fl - angio · 1 of 1 slices shown (1 of 2)]
[im 1/1]
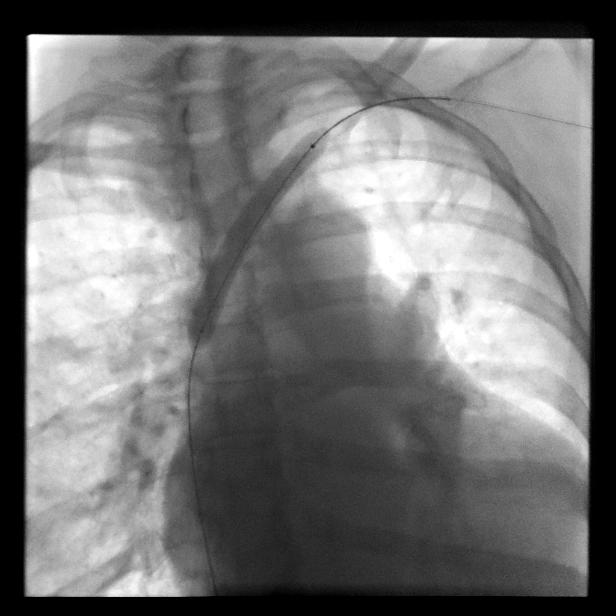

[Series 8: care upper arm · 2 of 2 slices shown (4 of 6)]
[im 1/2]
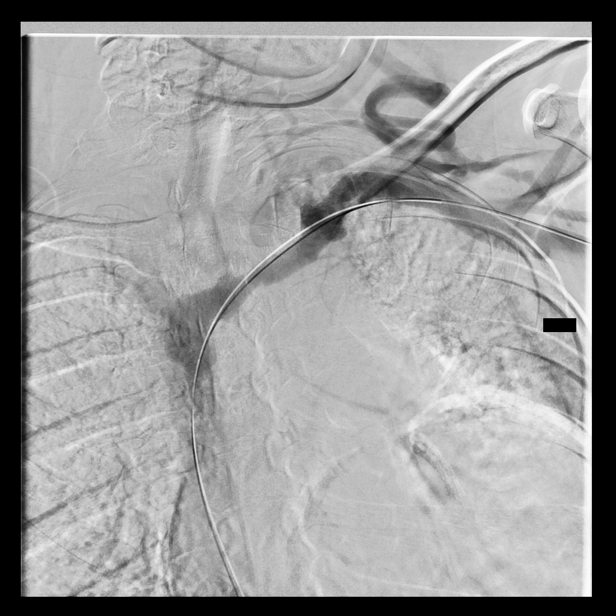
[im 2/2]
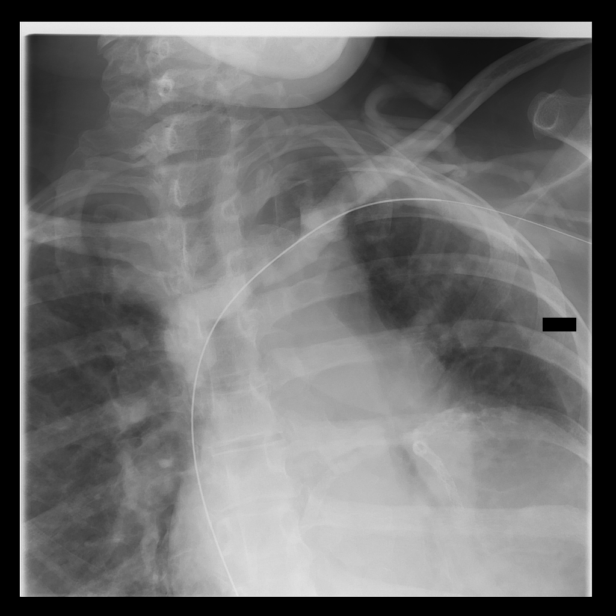

[Series 9: fl - angio · 1 of 1 slices shown (2 of 2)]
[im 1/1]
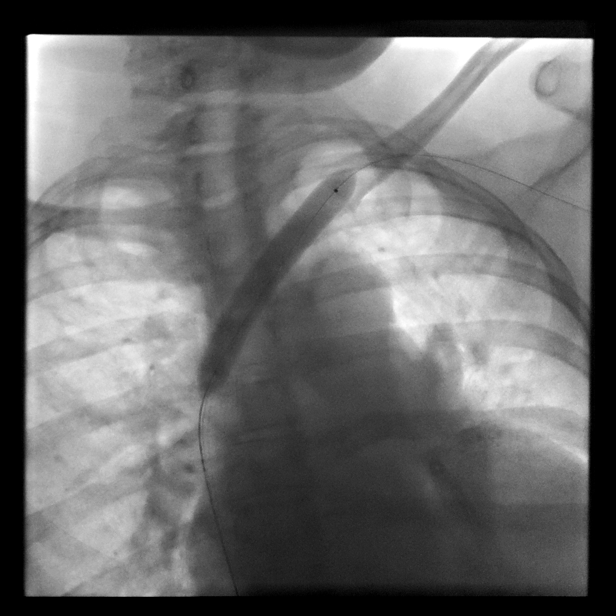

[Series 11: care upper arm · 2 of 2 slices shown (5 of 6)]
[im 1/2]
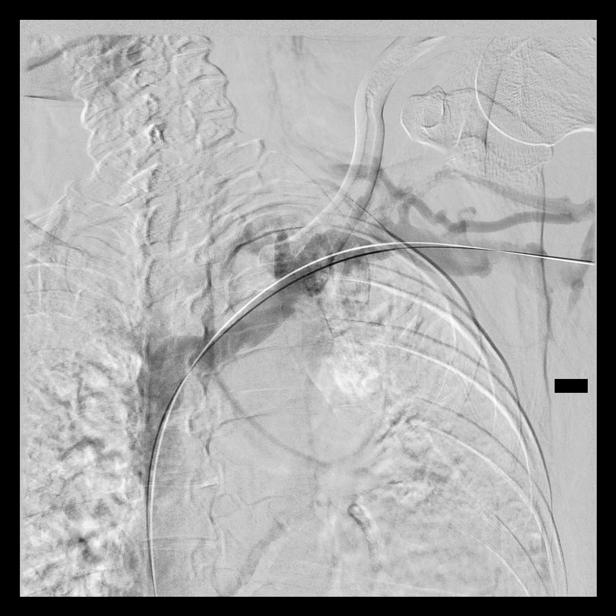
[im 2/2]
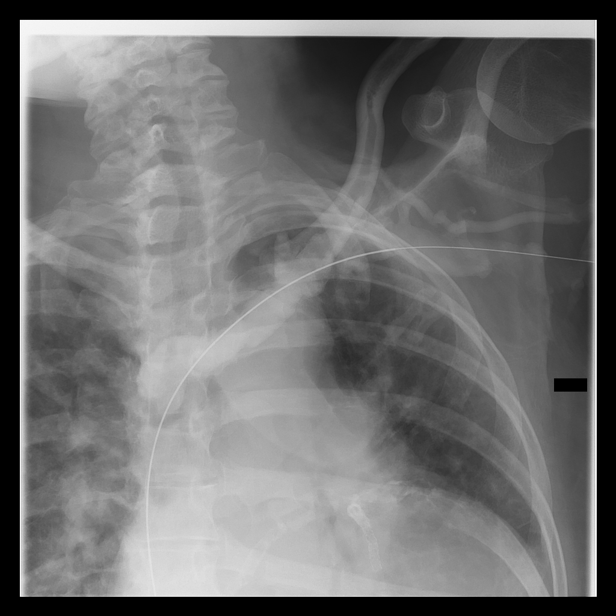

[Series 12: care upper arm · 2 of 2 slices shown (6 of 6)]
[im 1/2]
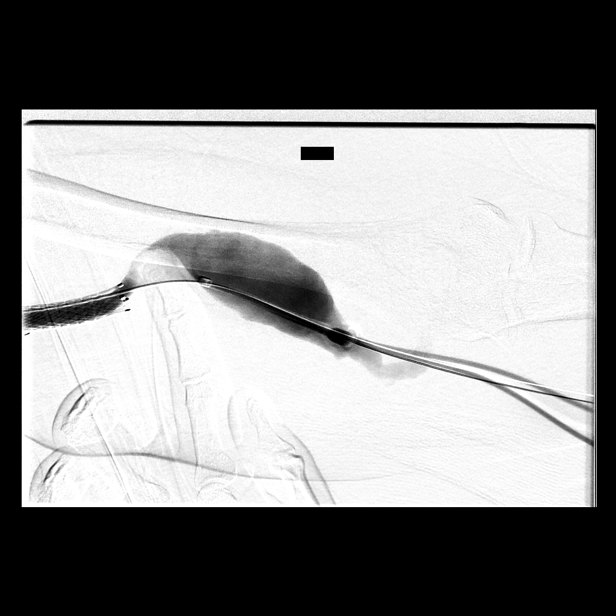
[im 2/2]
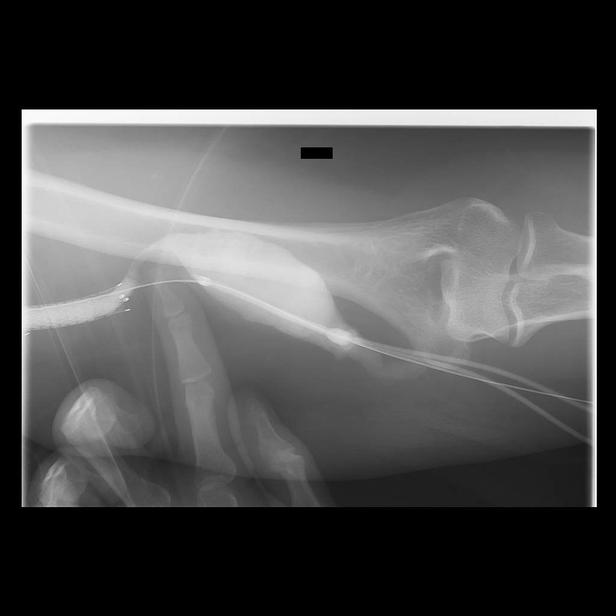

[14 of 14 positions shown; findings below may reference images not displayed]

IMAGES IMPORTED FROM THE SYNGO WORKFLOW SYSTEM
NO DICTATION FOR STUDY

## 2015-01-24 IMAGING — CR DG CHEST 1V PORT
1 series · 1 of 1 positions shown · non-contrast
Comparison: none

REASON FOR EXAM: Chest Pain
COMMENTS:

[ap]
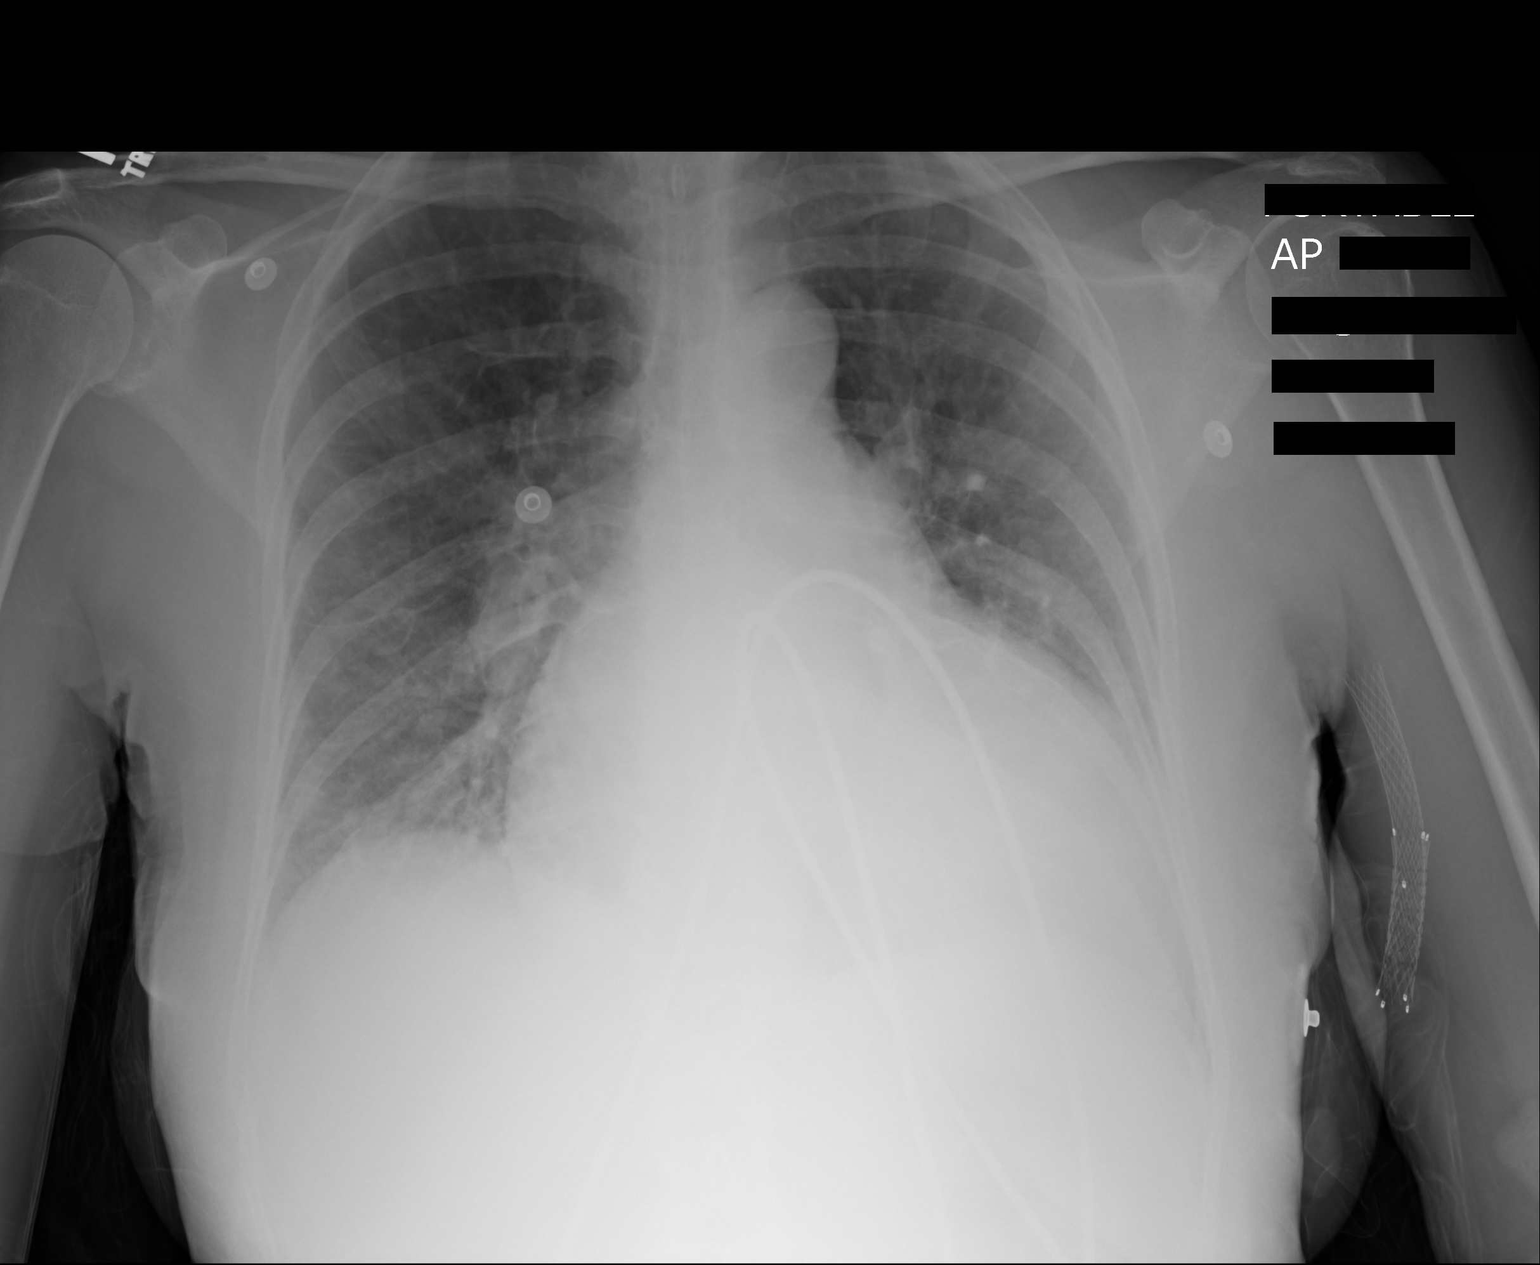

[1 of 1 positions shown; findings below may reference images not displayed]

PROCEDURE:     DXR - DXR PORTABLE CHEST SINGLE VIEW  - February 09, 2013 [DATE]

RESULT:     Comparison is made to study October 10, 2012.

The lungs are well-expanded. The cardiac silhouette remains enlarged. The
central pulmonary vascularity is engorged. More peripherally the
interstitial markings are only mildly prominent. There is no pleural
effusion. The mediastinum is normal in width. A radiodense vascular graft is
noted in the region of the brachial artery on the left.
IMPRESSION: The findings suggest low-grade CHF with interstitial edema.
When the patient can tolerate the procedure, a PA and lateral chest x-ray
would be of value.

[REDACTED]

## 2015-01-29 IMAGING — CR DG CHEST 1V PORT
1 series · 1 of 1 positions shown · non-contrast
Comparison: none

REASON FOR EXAM: chest pain
COMMENTS:

PROCEDURE:     DXR - DXR PORTABLE CHEST SINGLE VIEW  - February 14, 2013  [DATE]
RESULT:     Mediastinum is normal. Cardiomegaly. Lungs are clear of acute
infiltrates. Stent stents are noted in the left upper extremity.

[ap]
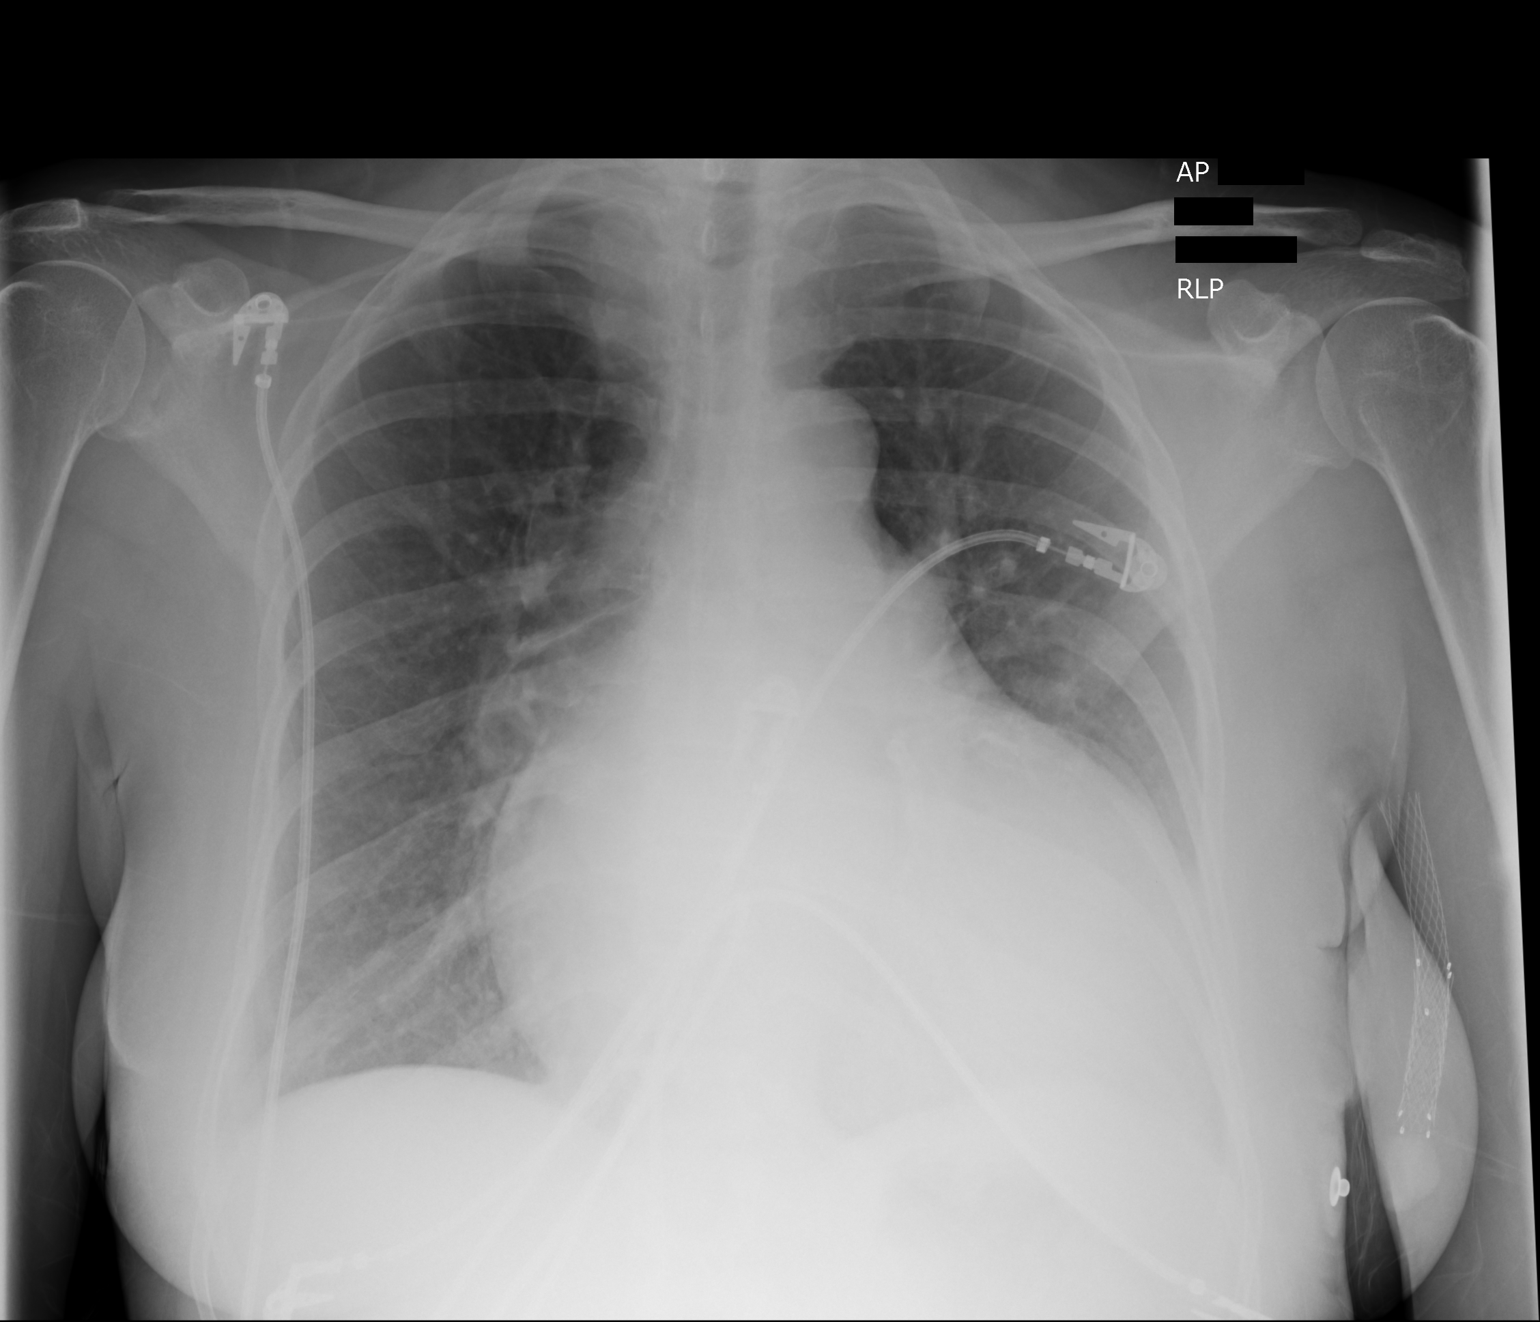

[1 of 1 positions shown; findings below may reference images not displayed]

IMPRESSION: Severe cardiomegaly. This is stable from 02/09/2013. On
today's exam there is no evidence of CHF.

## 2015-03-26 IMAGING — CR DG FOOT COMPLETE 3+V*L*
1 series · 3 of 3 positions shown · non-contrast
Comparison: none

REASON FOR EXAM: fall
COMMENTS:

[Series 1: ap · 0.17mm/px · 3 of 3 slices shown]
[im 1/3]
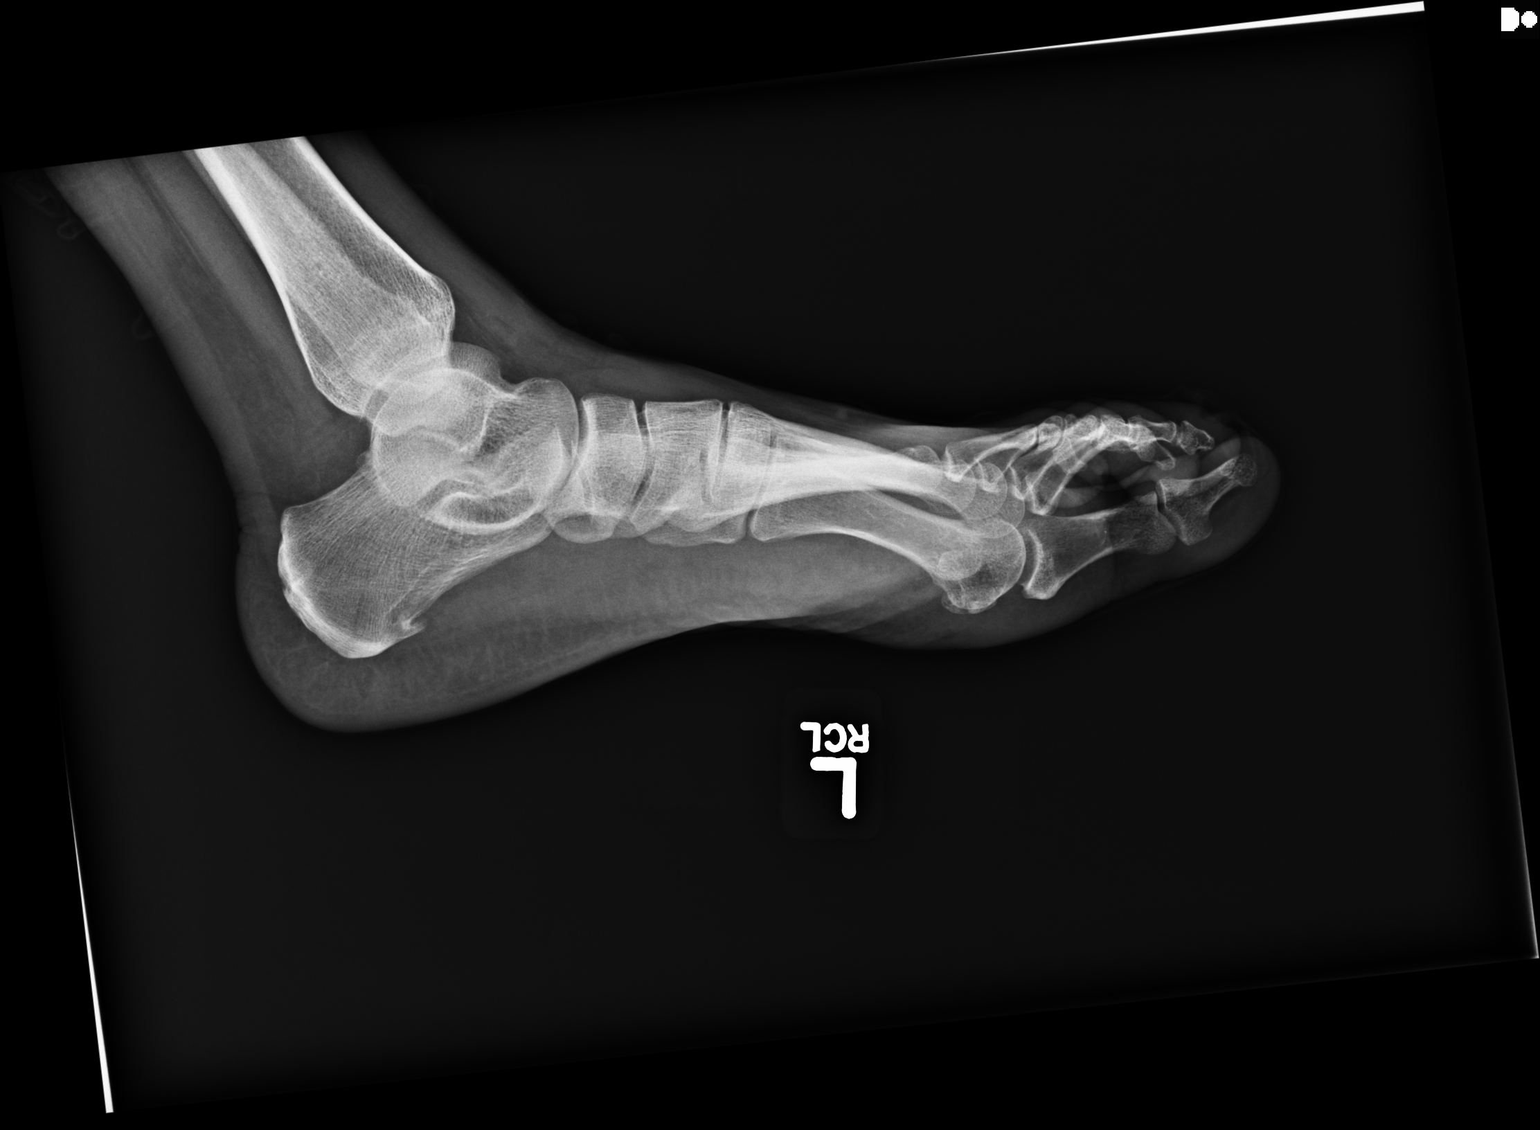
[im 2/3]
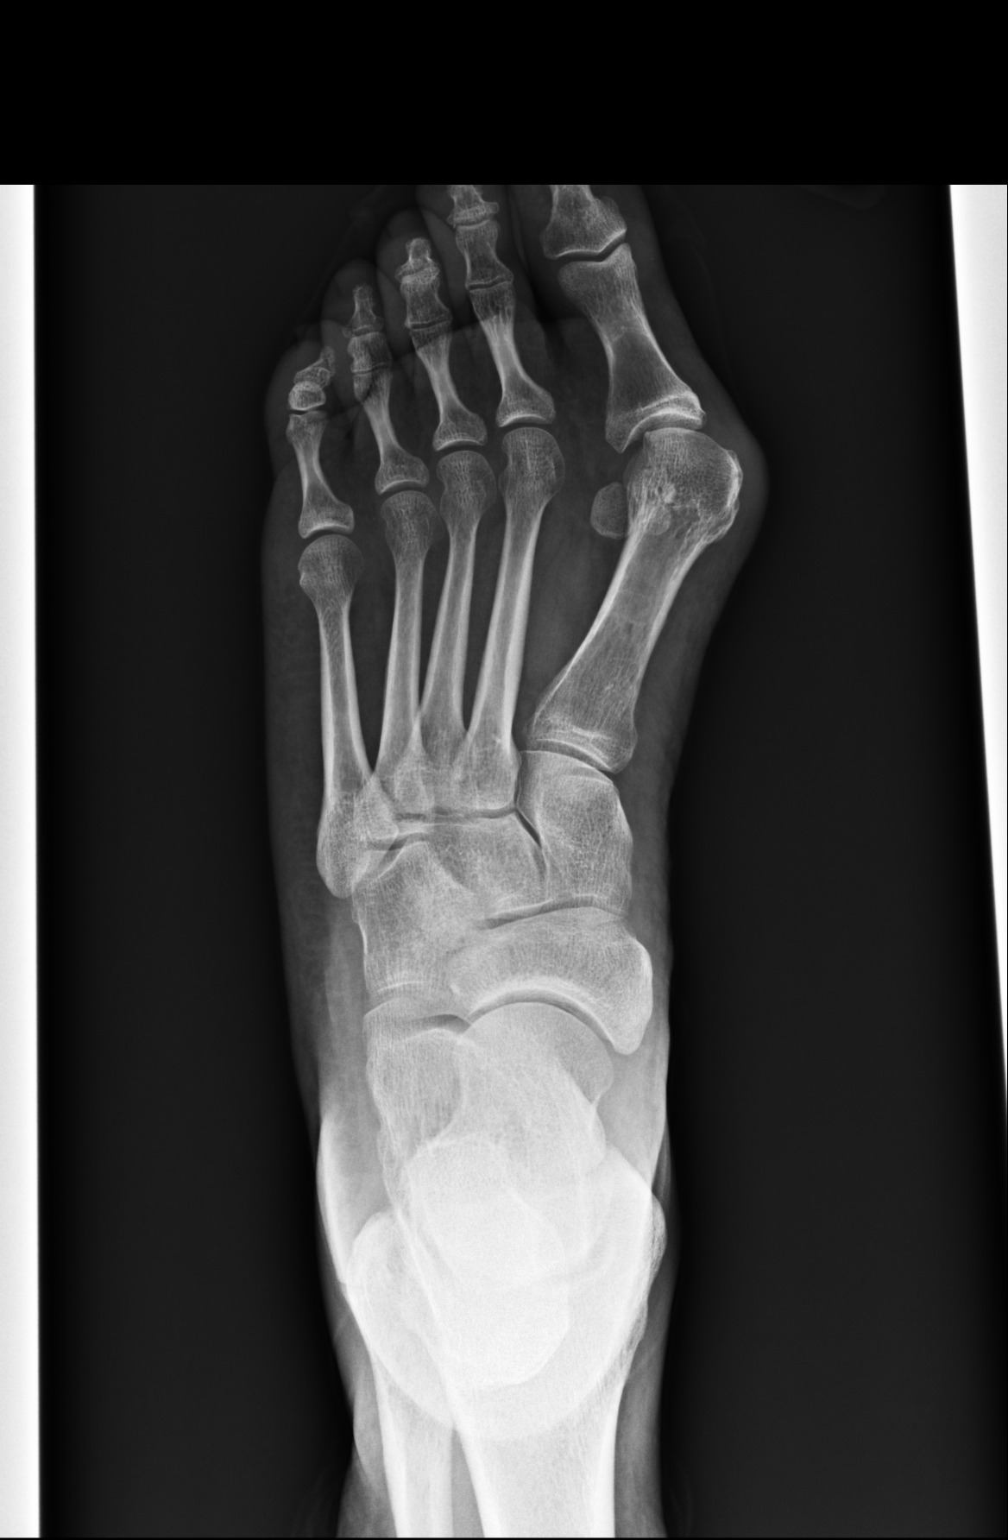
[im 3/3]
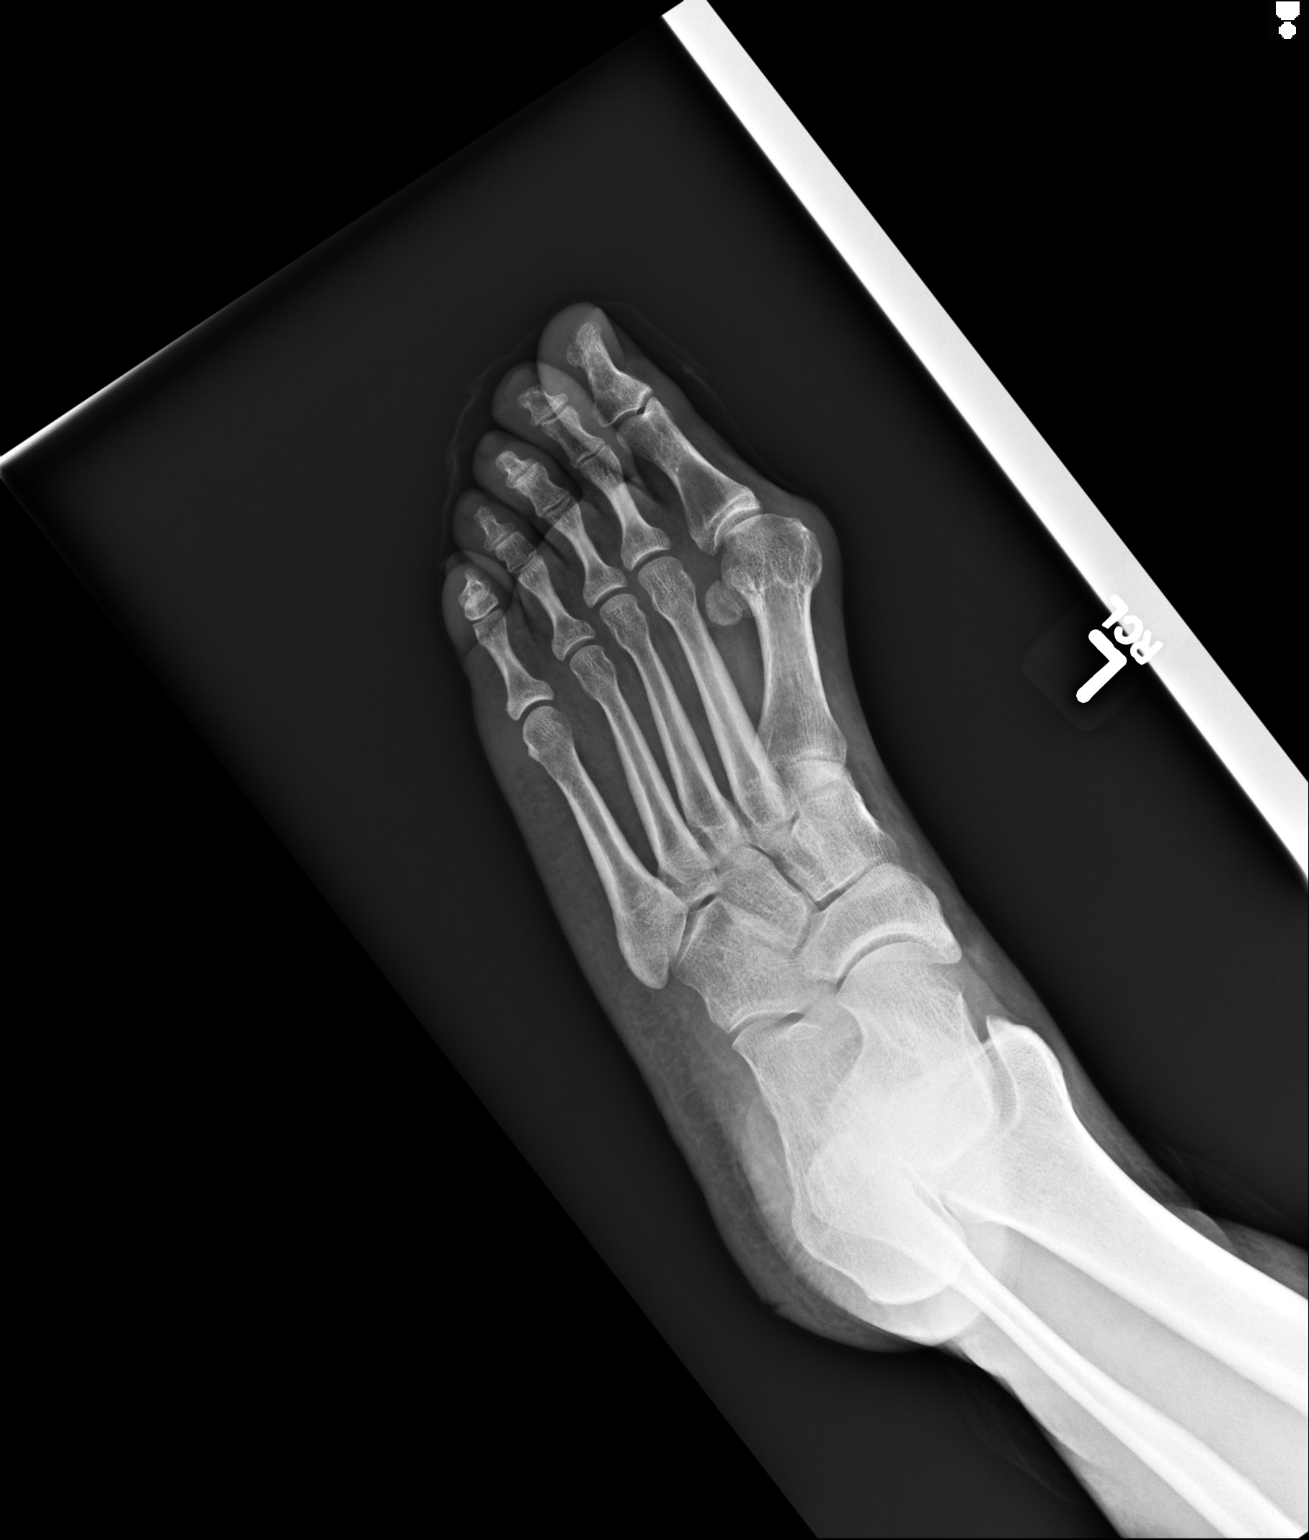

[3 of 3 positions shown; findings below may reference images not displayed]

PROCEDURE:     DXR - DXR FOOT LT COMP W/OBLIQUES  - April 11, 2013  [DATE]

RESULT:     Left foot images demonstrate hallux valgus deformity at the
first metatarsal phalangeal joint. There is spurring in the plantar region
of the calcaneus. There is no definite fracture or foreign body. The bony
structures are otherwise unremarkable.
IMPRESSION: 1. Hallux valgus deformity in the first metatarsal phalangeal joint.

[REDACTED]

## 2015-04-17 IMAGING — XA IR VASCULAR PROCEDURE
5 series · 8 of 8 positions shown · non-contrast
Comparison: none

[Series 1: care upper arm · 2 of 2 slices shown (1 of 4)]
[im 1/2]
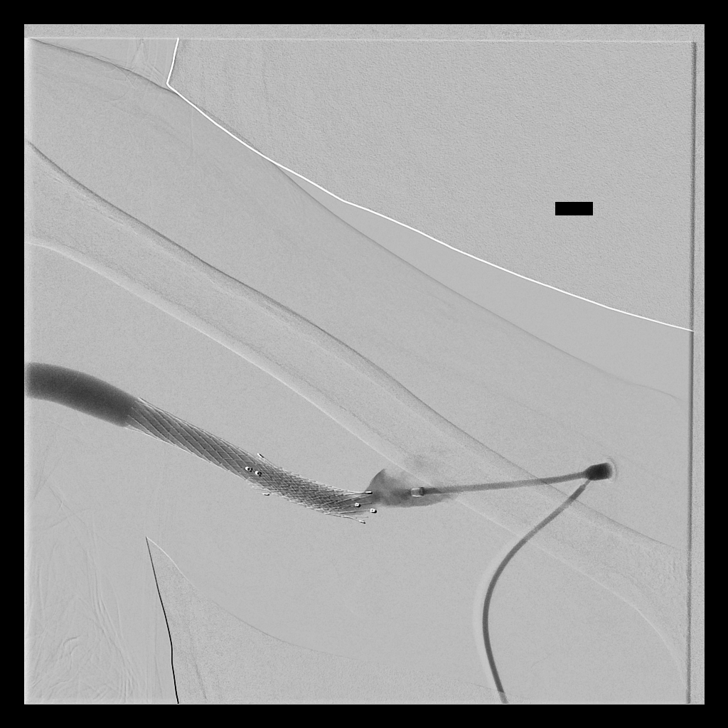
[im 2/2]
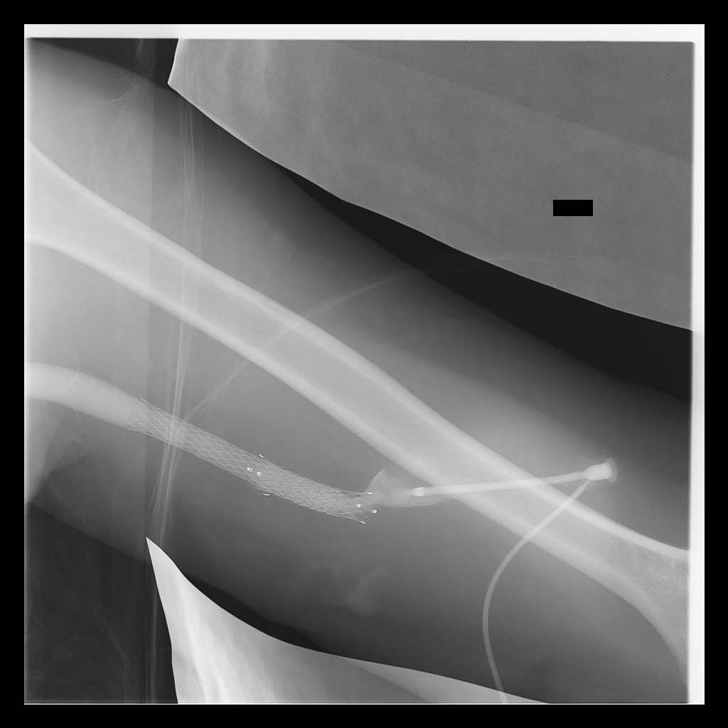

[Series 2: care upper arm · 2 of 2 slices shown (2 of 4)]
[im 1/2]
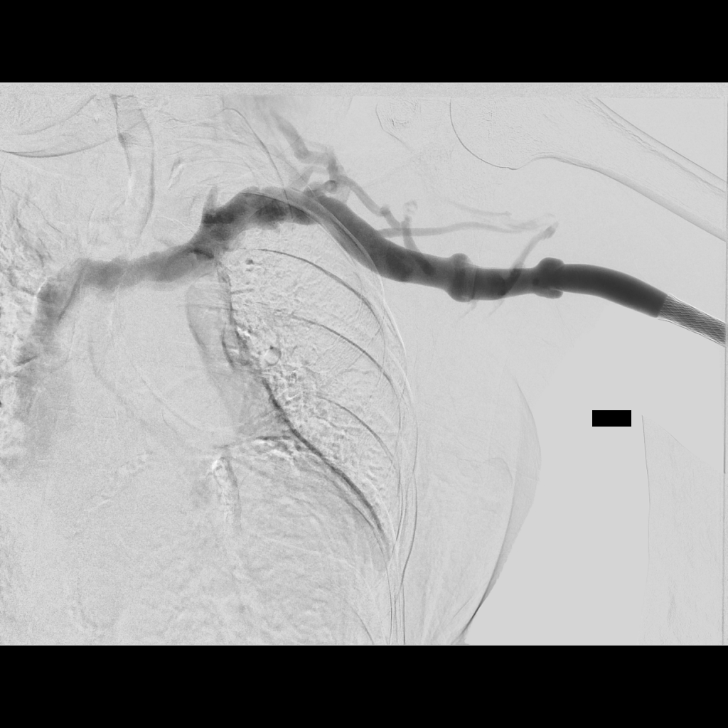
[im 2/2]
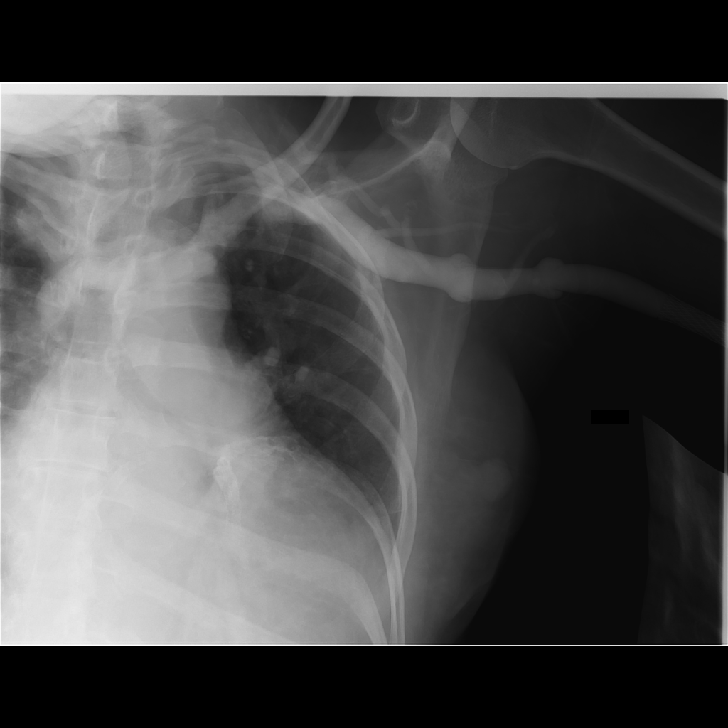

[Series 3: fl - angio · 1 of 1 slices shown]
[im 1/1]
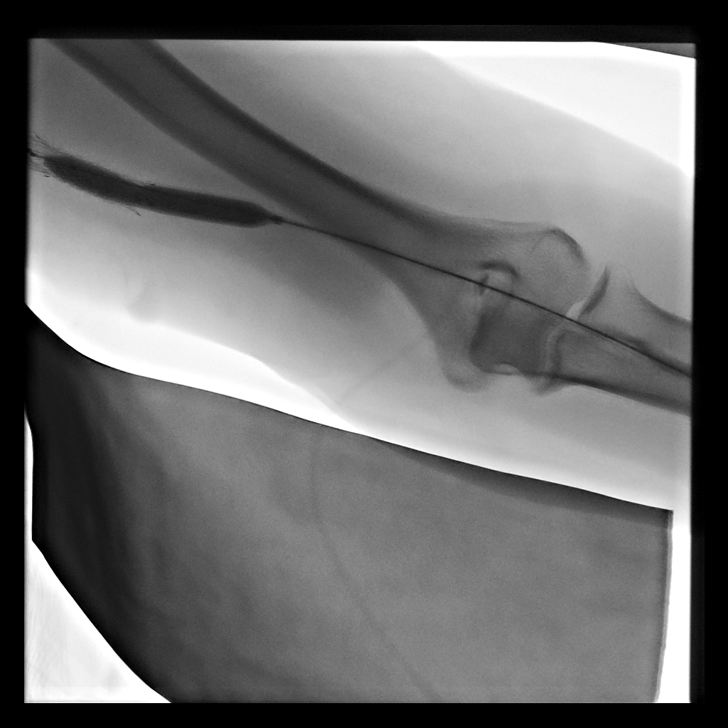

[Series 4: care upper arm · 1 of 1 slices shown (3 of 4)]
[im 1/1]
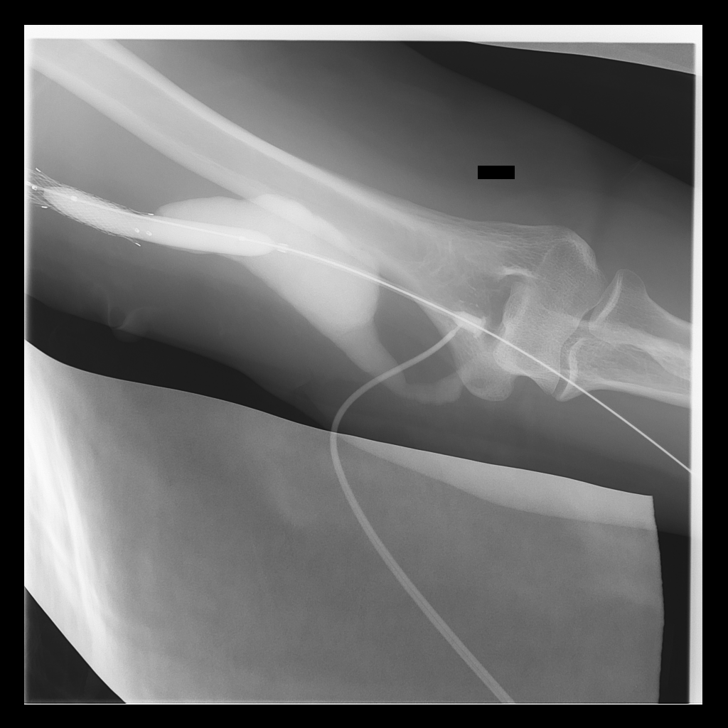

[Series 5: care upper arm · 2 of 2 slices shown (4 of 4)]
[im 1/2]
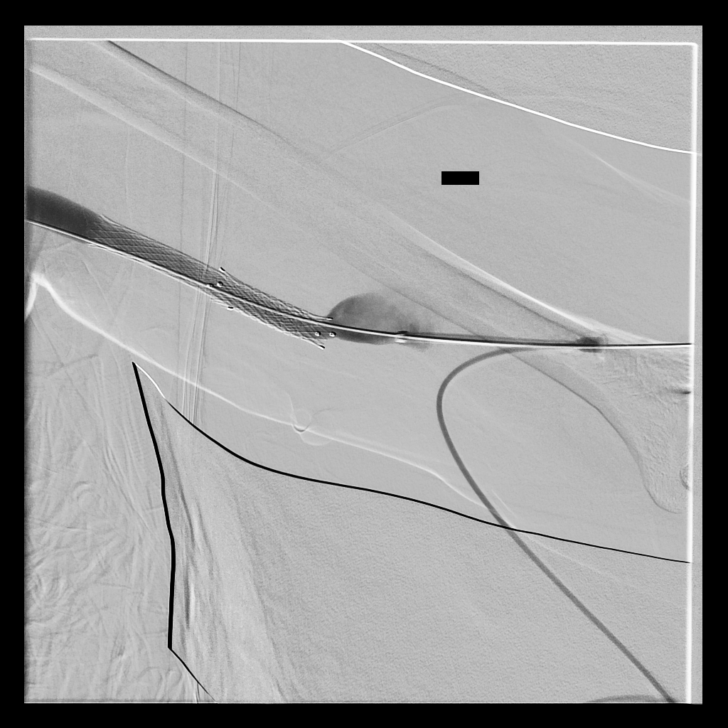
[im 2/2]
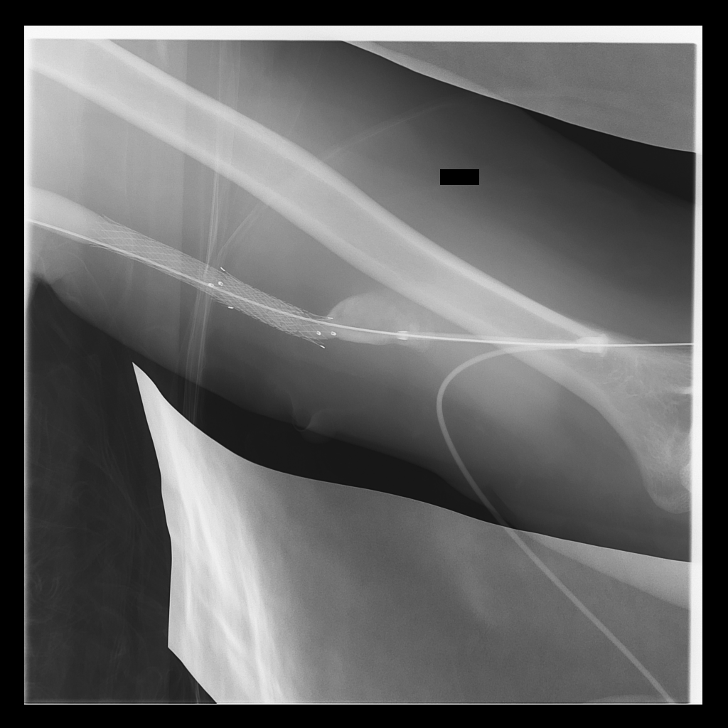

[8 of 8 positions shown; findings below may reference images not displayed]

IMAGES IMPORTED FROM THE SYNGO WORKFLOW SYSTEM
NO DICTATION FOR STUDY

## 2015-04-25 IMAGING — CT CT HEAD WITHOUT CONTRAST
1 of 3 series · 13 of 30 positions shown, 17 images · non-contrast
Comparison: 09/11/2012

CLINICAL DATA: Left-sided weakness.

EXAM:
CT HEAD WITHOUT CONTRAST
TECHNIQUE: Contiguous axial images were obtained from the base of the skull
through the vertex without intravenous contrast.

[Series 2: head wo · axial · 0.40mm/px · z∈[-102,+14]mm · 13 of 30 slices shown, 17 images]
[im 3/30  brain]
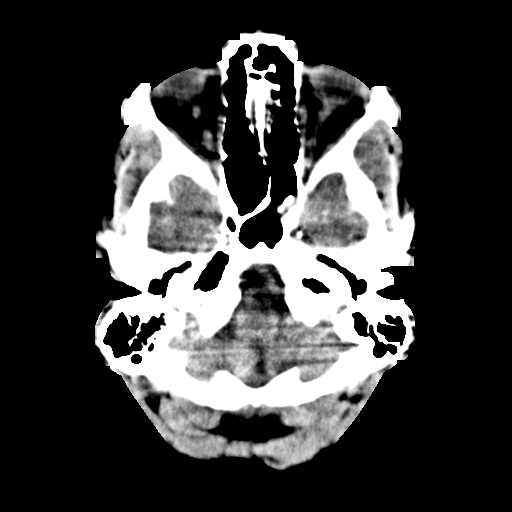
[im 3/30  bone]
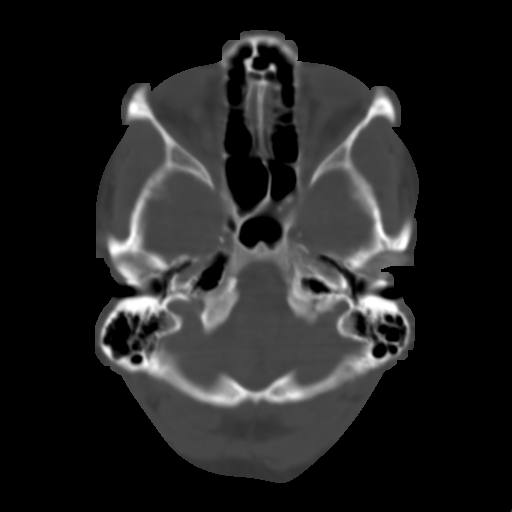
[im 5/30  brain]
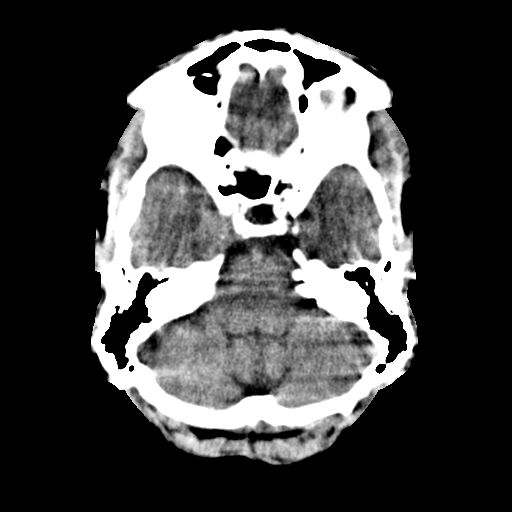
[im 7/30  brain]
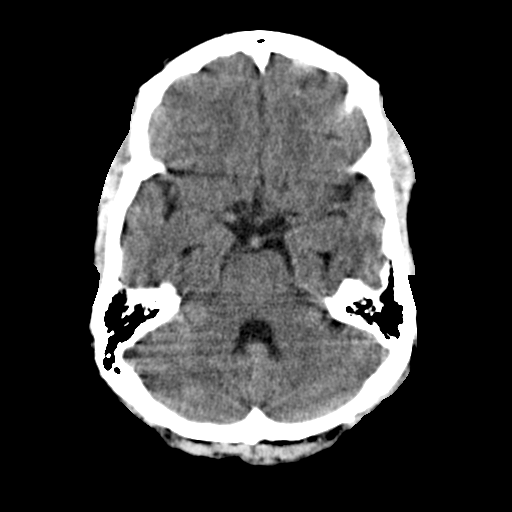
[im 9/30  brain]
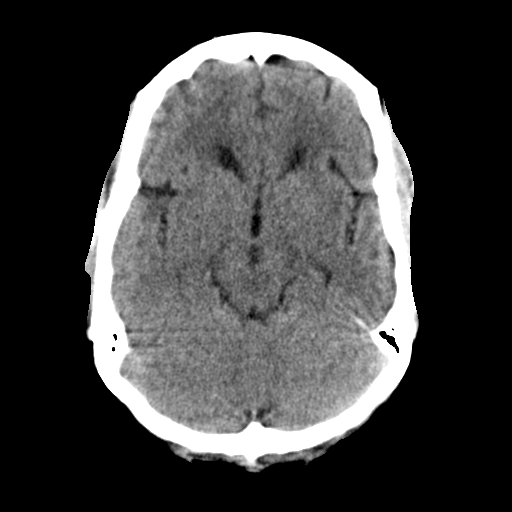
[im 11/30  brain]
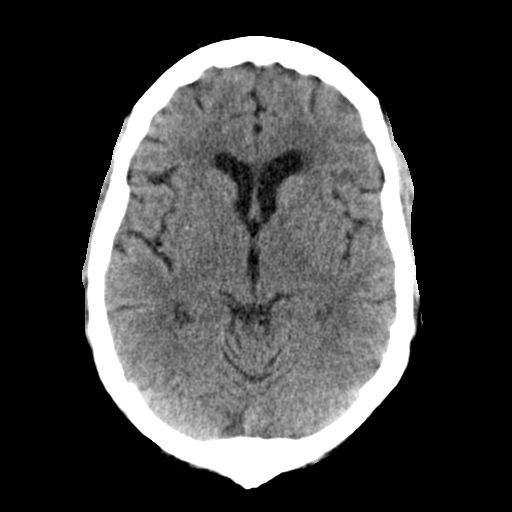
[im 11/30  bone]
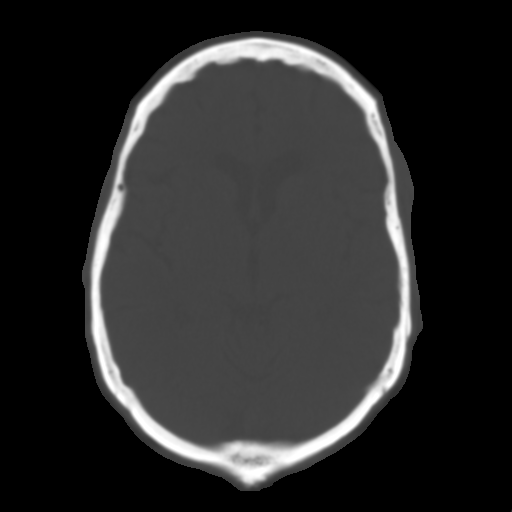
[im 13/30  brain]
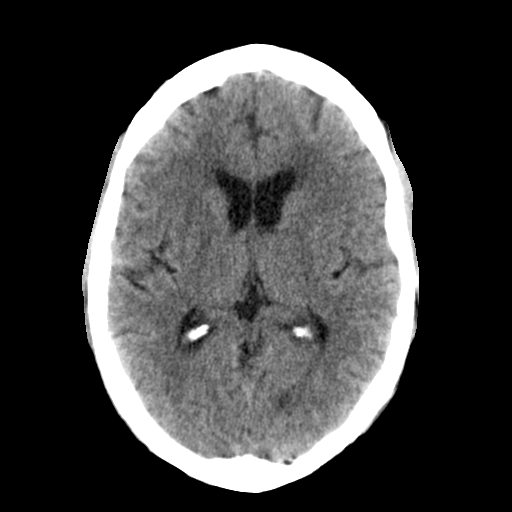
[im 15/30  brain]
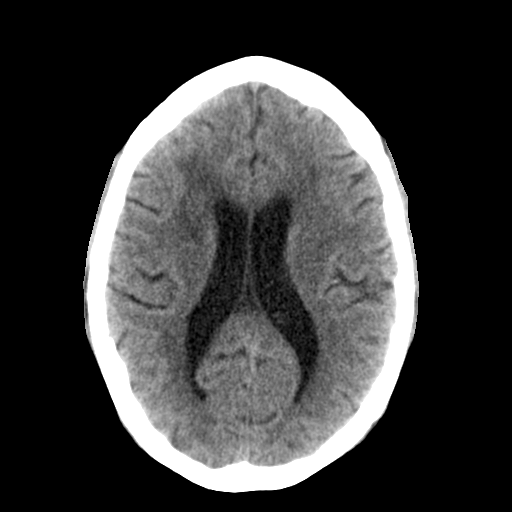
[im 17/30  brain]
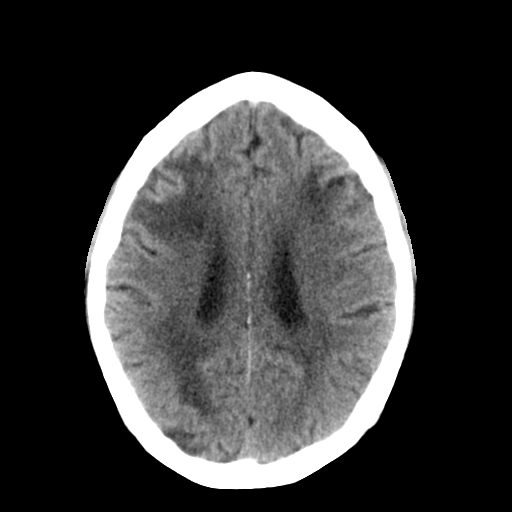
[im 19/30  brain]
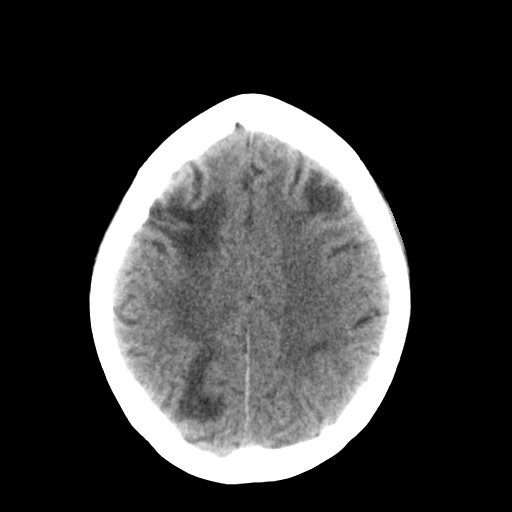
[im 19/30  bone]
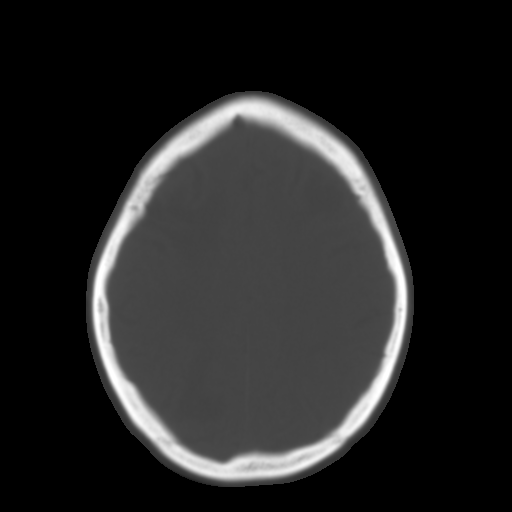
[im 21/30  brain]
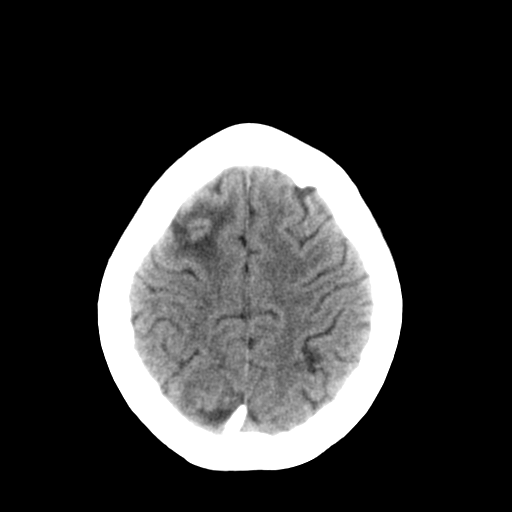
[im 23/30  brain]
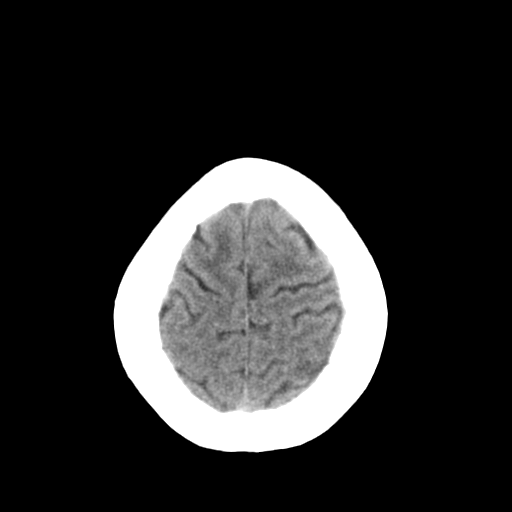
[im 25/30  brain]
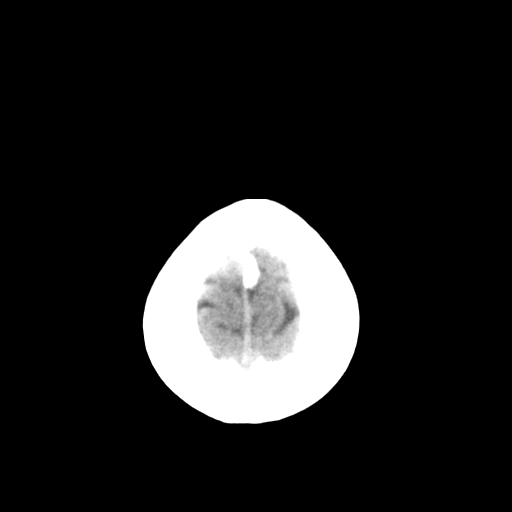
[im 27/30  brain]
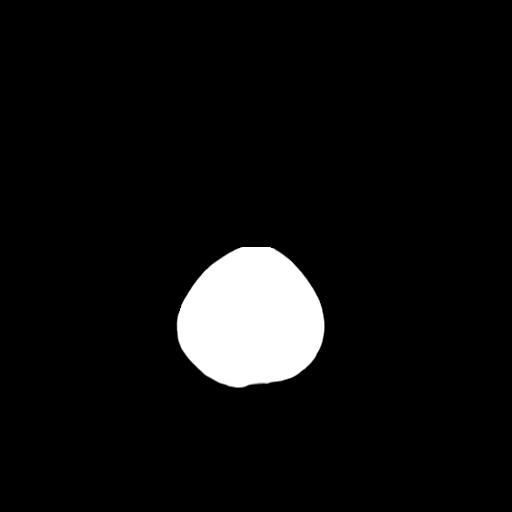
[im 27/30  bone]
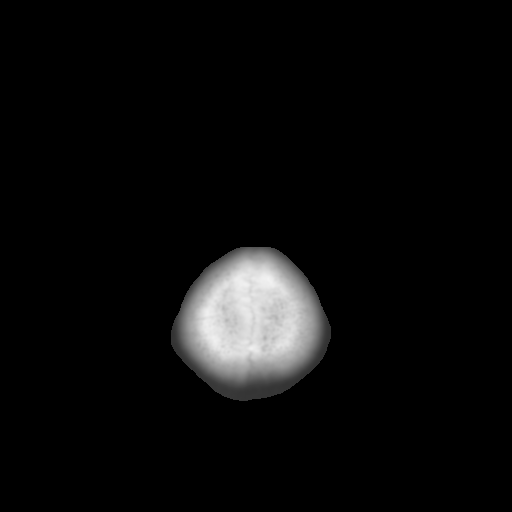

[13 of 30 positions shown; findings below may reference images not displayed]

FINDINGS: There is no evidence of intracranial hemorrhage, brain edema, or
other signs of acute infarction. There is no evidence of
intracranial mass lesion or mass effect. No abnormal extraaxial
fluid collections are identified.

Encephalomalacia is again seen in the frontal and parietal regions
bilaterally, which are stable in appearance and consistent with old
infarcts. Ventricles remain normal in size. No skull abnormality
identified.
IMPRESSION: No acute intracranial abnormality.

Old bilateral frontal and parietal lobe infarcts.

## 2015-04-25 IMAGING — CR DG CHEST 1V PORT
1 series · 1 of 1 positions shown · non-contrast
Comparison: Prior radiograph from 02/14/2013

CLINICAL DATA: Left-sided weakness

EXAM:
PORTABLE CHEST - 1 VIEW

[ap]
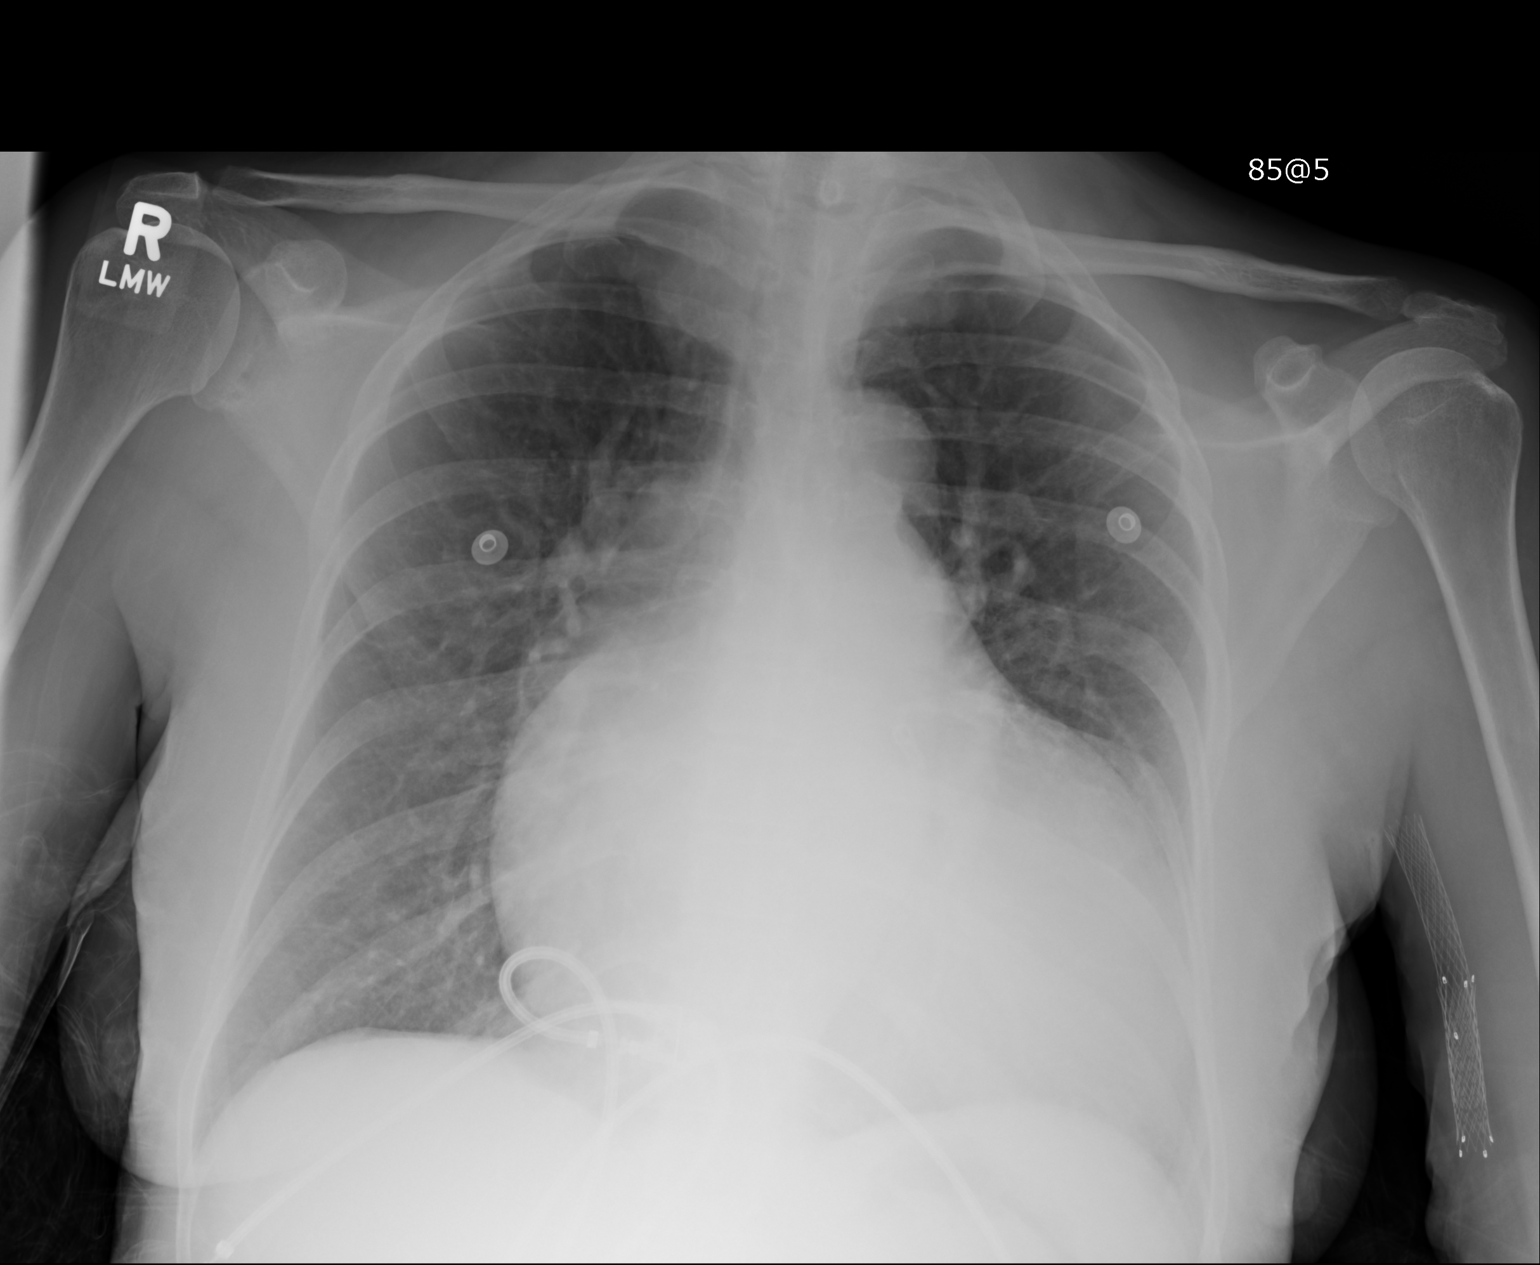

[1 of 1 positions shown; findings below may reference images not displayed]

FINDINGS: Marked cave cardiomegaly is stable as compared to the prior exam.

Lungs are normally inflated. No evidence of pulmonary edema or
cardiac failure. No pleural effusion. No pneumothorax or focal
infiltrate.

Vascular stent overlies the left upper extremity. No acute osseous
abnormality identified.
IMPRESSION: Stable appearance of severe cardiomegaly without evidence of
pulmonary edema or focal infiltrate.

## 2015-07-18 ENCOUNTER — Emergency Department: Payer: No Typology Code available for payment source

## 2015-07-18 ENCOUNTER — Encounter: Payer: Self-pay | Admitting: Emergency Medicine

## 2015-07-18 ENCOUNTER — Emergency Department
Admission: EM | Admit: 2015-07-18 | Discharge: 2015-07-18 | Disposition: A | Discharge status: Home / Self Care | Payer: No Typology Code available for payment source | Attending: Emergency Medicine | Admitting: Emergency Medicine

## 2015-07-18 DIAGNOSIS — N186 End stage renal disease: Secondary | ICD-10-CM | POA: Insufficient documentation

## 2015-07-18 DIAGNOSIS — Z7901 Long term (current) use of anticoagulants: Secondary | ICD-10-CM | POA: Insufficient documentation

## 2015-07-18 DIAGNOSIS — Y92238 Other place in hospital as the place of occurrence of the external cause: Secondary | ICD-10-CM | POA: Diagnosis not present

## 2015-07-18 DIAGNOSIS — Y998 Other external cause status: Secondary | ICD-10-CM | POA: Insufficient documentation

## 2015-07-18 DIAGNOSIS — Z91040 Latex allergy status: Secondary | ICD-10-CM | POA: Insufficient documentation

## 2015-07-18 DIAGNOSIS — Y9389 Activity, other specified: Secondary | ICD-10-CM | POA: Diagnosis not present

## 2015-07-18 DIAGNOSIS — S8992XA Unspecified injury of left lower leg, initial encounter: Secondary | ICD-10-CM | POA: Diagnosis present

## 2015-07-18 DIAGNOSIS — Z79899 Other long term (current) drug therapy: Secondary | ICD-10-CM | POA: Insufficient documentation

## 2015-07-18 DIAGNOSIS — I120 Hypertensive chronic kidney disease with stage 5 chronic kidney disease or end stage renal disease: Secondary | ICD-10-CM | POA: Insufficient documentation

## 2015-07-18 DIAGNOSIS — S8012XA Contusion of left lower leg, initial encounter: Secondary | ICD-10-CM | POA: Diagnosis not present

## 2015-07-18 DIAGNOSIS — W231XXA Caught, crushed, jammed, or pinched between stationary objects, initial encounter: Secondary | ICD-10-CM | POA: Insufficient documentation

## 2015-07-18 DIAGNOSIS — Z87891 Personal history of nicotine dependence: Secondary | ICD-10-CM | POA: Diagnosis not present

## 2015-07-18 DIAGNOSIS — Z7982 Long term (current) use of aspirin: Secondary | ICD-10-CM | POA: Insufficient documentation

## 2015-07-18 LAB — BASIC METABOLIC PANEL
Anion gap: 16 — ABNORMAL HIGH (ref 5–15)
BUN: 82 mg/dL — AB (ref 6–20)
CALCIUM: 8.3 mg/dL — AB (ref 8.9–10.3)
CO2: 26 mmol/L (ref 22–32)
CREATININE: 10.96 mg/dL — AB (ref 0.44–1.00)
Chloride: 96 mmol/L — ABNORMAL LOW (ref 101–111)
GFR calc non Af Amer: 4 mL/min — ABNORMAL LOW (ref >60)
GFR, EST AFRICAN AMERICAN: 4 mL/min — AB (ref >60)
Glucose, Bld: 86 mg/dL (ref 65–99)
Potassium: 4.3 mmol/L (ref 3.5–5.1)
SODIUM: 138 mmol/L (ref 135–145)

## 2015-07-18 LAB — CBC
HCT: 44.2 % (ref 35.0–47.0)
Hemoglobin: 14.3 g/dL (ref 12.0–16.0)
MCH: 28 pg (ref 26.0–34.0)
MCHC: 32.4 g/dL (ref 32.0–36.0)
MCV: 86.4 fL (ref 80.0–100.0)
Platelets: 163 10*3/uL (ref 150–440)
RBC: 5.12 MIL/uL (ref 3.80–5.20)
RDW: 17.9 % — AB (ref 11.5–14.5)
WBC: 3.6 10*3/uL (ref 3.6–11.0)

## 2015-07-18 MED ORDER — HYDROCODONE-ACETAMINOPHEN 5-325 MG PO TABS: 1.0000 | ORAL_TABLET | ORAL | Status: DC | PRN

## 2015-07-18 MED ORDER — OXYCODONE-ACETAMINOPHEN 5-325 MG PO TABS
1.0000 | ORAL_TABLET | Freq: Once | ORAL | Status: AC
  Administered 2015-07-18: 1 via ORAL
  Filled 2015-07-18: qty 1

## 2015-07-18 NOTE — Discharge Instructions (Signed)
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
## 2015-07-29 ENCOUNTER — Other Ambulatory Visit: Payer: Self-pay | Admitting: Unknown Physician Specialty

## 2015-07-29 DIAGNOSIS — M79662 Pain in left lower leg: Secondary | ICD-10-CM

## 2015-07-31 ENCOUNTER — Ambulatory Visit
Admission: RE | Admit: 2015-07-31 | Discharge: 2015-07-31 | Disposition: A | Discharge status: Home / Self Care | Payer: Medicare Other | Source: Ambulatory Visit | Attending: Unknown Physician Specialty | Admitting: Unknown Physician Specialty

## 2015-07-31 DIAGNOSIS — M79662 Pain in left lower leg: Secondary | ICD-10-CM | POA: Diagnosis not present

## 2015-08-12 ENCOUNTER — Other Ambulatory Visit: Payer: Self-pay | Admitting: Orthopedic Surgery

## 2015-08-12 DIAGNOSIS — M25562 Pain in left knee: Secondary | ICD-10-CM

## 2015-08-30 ENCOUNTER — Ambulatory Visit
Admission: RE | Admit: 2015-08-30 | Discharge: 2015-08-30 | Disposition: A | Discharge status: Home / Self Care | Payer: Medicare Other | Source: Ambulatory Visit | Attending: Orthopedic Surgery | Admitting: Orthopedic Surgery

## 2015-08-30 DIAGNOSIS — S83242A Other tear of medial meniscus, current injury, left knee, initial encounter: Secondary | ICD-10-CM | POA: Diagnosis not present

## 2015-08-30 DIAGNOSIS — X58XXXA Exposure to other specified factors, initial encounter: Secondary | ICD-10-CM | POA: Insufficient documentation

## 2015-08-30 DIAGNOSIS — M25562 Pain in left knee: Secondary | ICD-10-CM | POA: Diagnosis present

## 2015-09-21 IMAGING — CR DG CHEST 2V
1 series · 2 of 2 positions shown · non-contrast
Comparison: Chest radiograph performed 05/11/2013

CLINICAL DATA: Chest pain and vomiting.

EXAM:
CHEST  2 VIEW

[Series 1: x chest ap · 0.14mm/px · 2 of 2 slices shown]
[im 1/2]
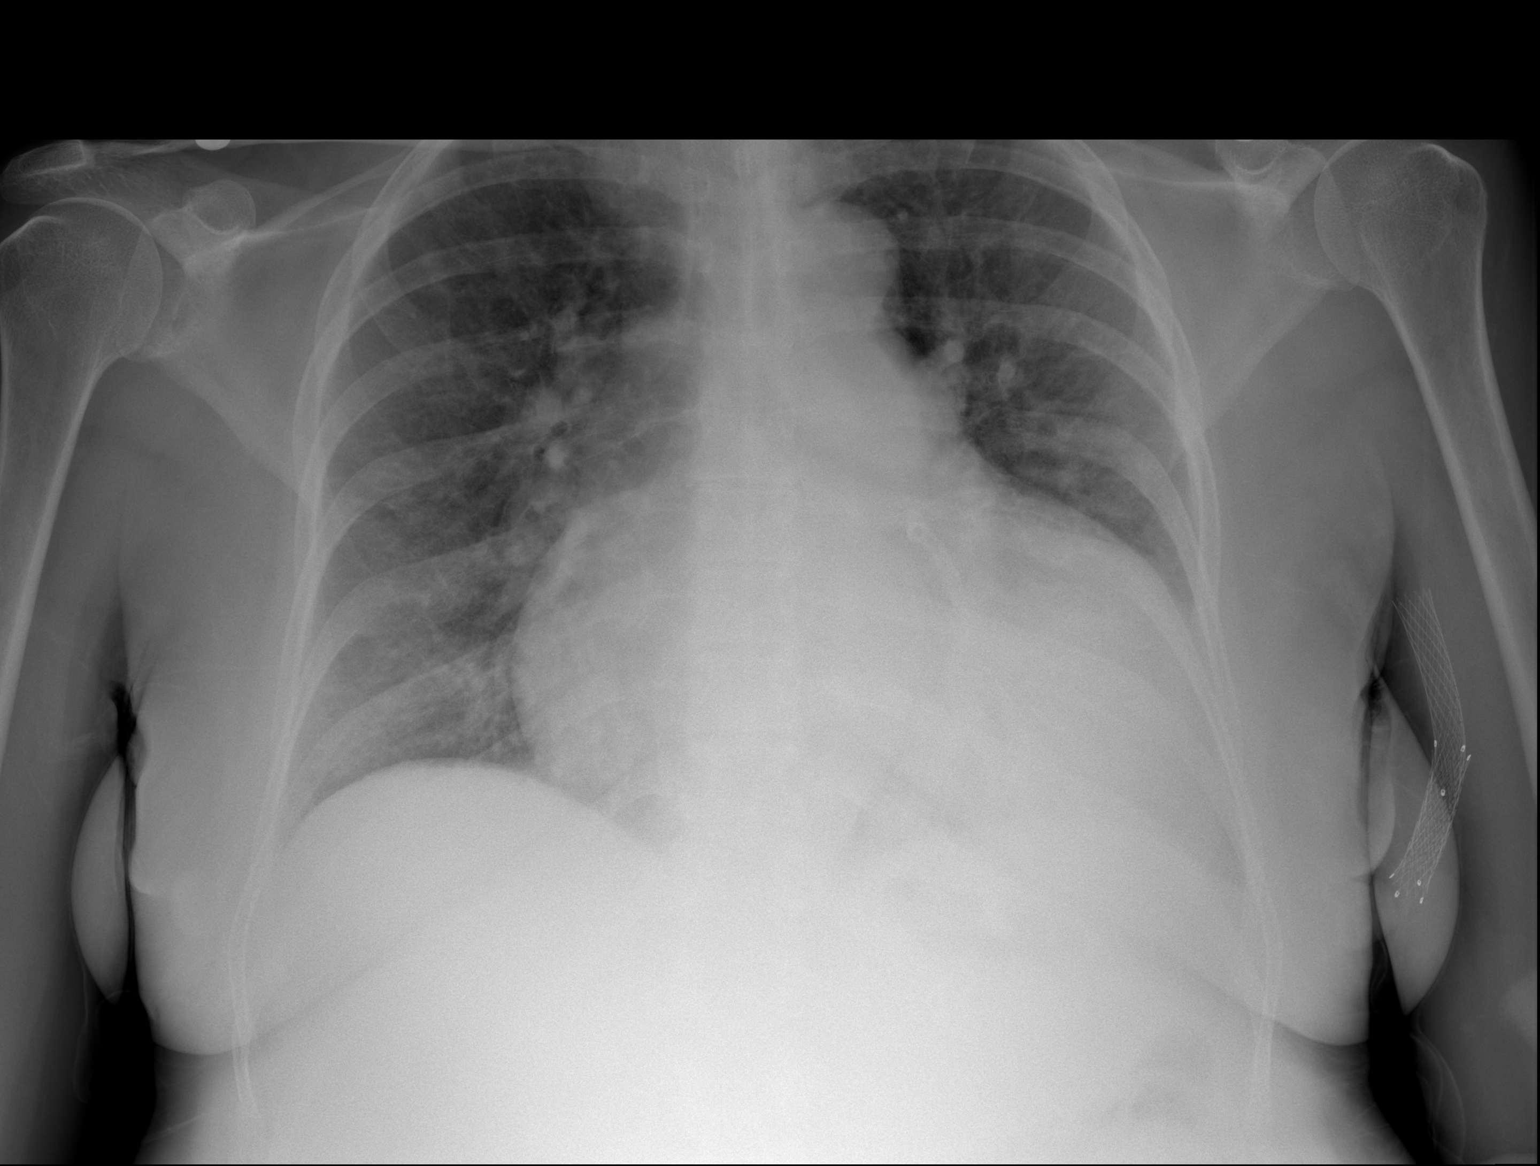
[im 2/2]
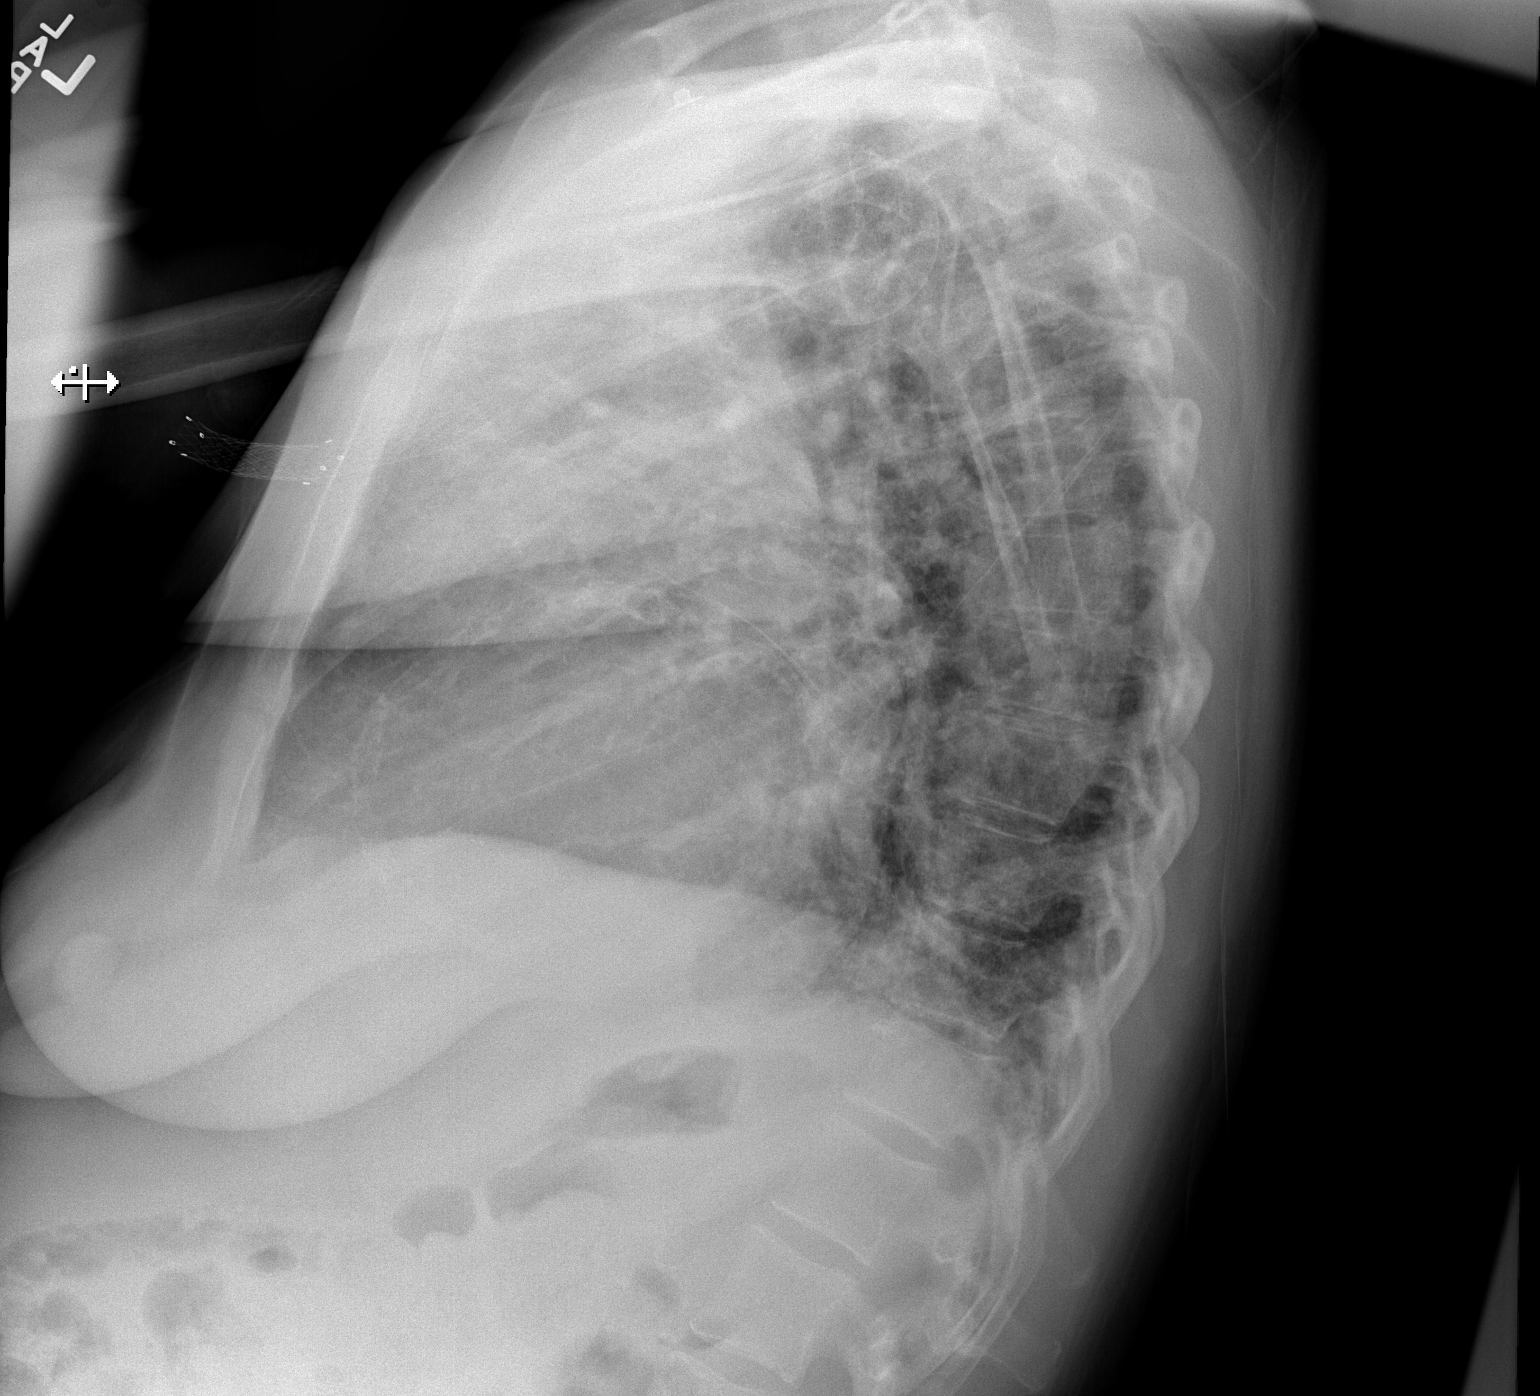

[2 of 2 positions shown; findings below may reference images not displayed]

FINDINGS: The lungs are well-aerated. Mild bilateral atelectasis is seen.
There is no evidence of focal opacification, pleural effusion or
pneumothorax. A vascular stent is noted along the proximal left arm.

The heart is enlarged. Vascular congestion is noted. No acute
osseous abnormalities are seen.
IMPRESSION: Mild bilateral atelectasis noted. Vascular congestion and
cardiomegaly seen.

## 2015-09-22 IMAGING — CR DG CHEST 1V PORT
1 series · 1 of 1 positions shown · non-contrast
Comparison: PA and lateral chest 10/07/2013.

CLINICAL DATA: Central line placement.

EXAM:
PORTABLE CHEST - 1 VIEW

[ap]
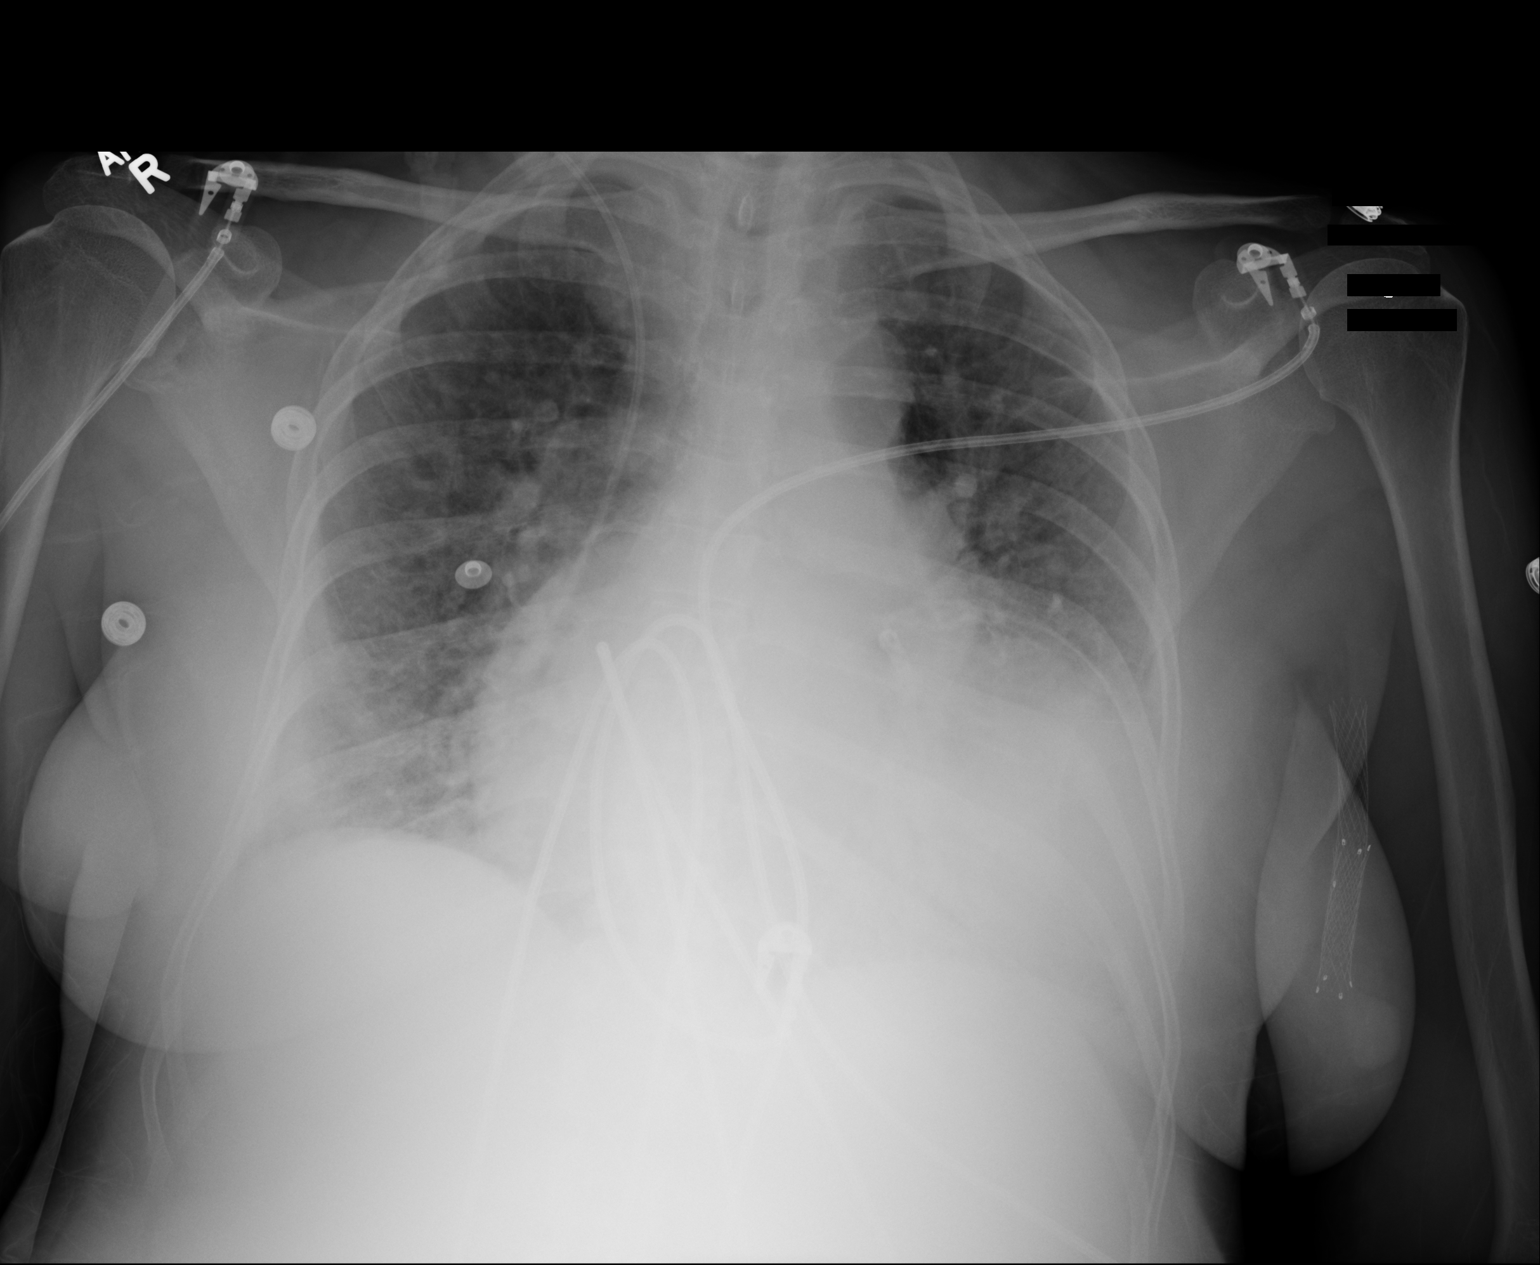

[1 of 1 positions shown; findings below may reference images not displayed]

FINDINGS: New right IJ catheter is in place with the tip projecting at the
superior cavoatrial junction. Marked cardiomegaly and vascular
congestion again seen. No pneumothorax. Persistent opacity in the
left lung base most consistent with atelectasis is noted.
IMPRESSION: Tip of right IJ catheter projects the superior cavoatrial junction.
Negative for pneumothorax.

Cardiomegaly and pulmonary vascular congestion.

## 2015-09-22 IMAGING — CT CT HEAD WITHOUT CONTRAST
1 of 2 series · 14 of 30 positions shown, 18 images · non-contrast
Comparison: CT of the head performed 05/11/2013

CLINICAL DATA: Altered mental status.  Somnolence.

EXAM:
CT HEAD WITHOUT CONTRAST
TECHNIQUE: Contiguous axial images were obtained from the base of the skull
through the vertex without intravenous contrast.

[Series 2: head wo · axial · 0.43mm/px · z∈[-122,-1]mm · 14 of 29 slices shown, 18 images]
[im 2/29  brain]
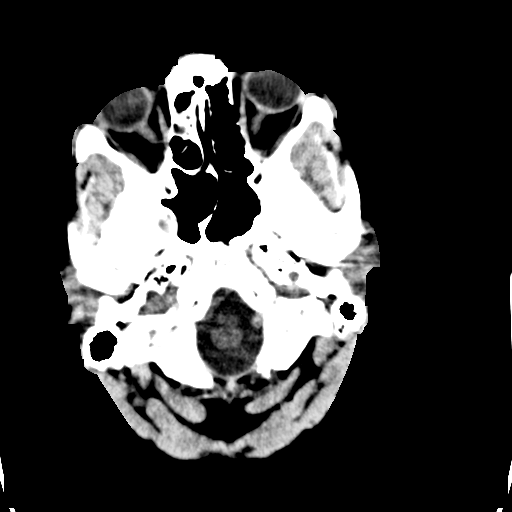
[im 2/29  bone]
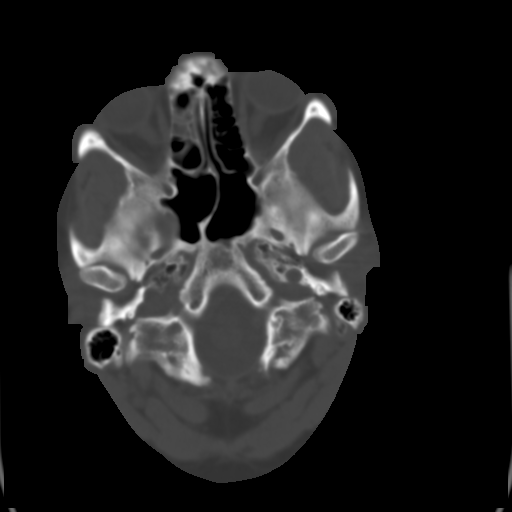
[im 4/29  brain]
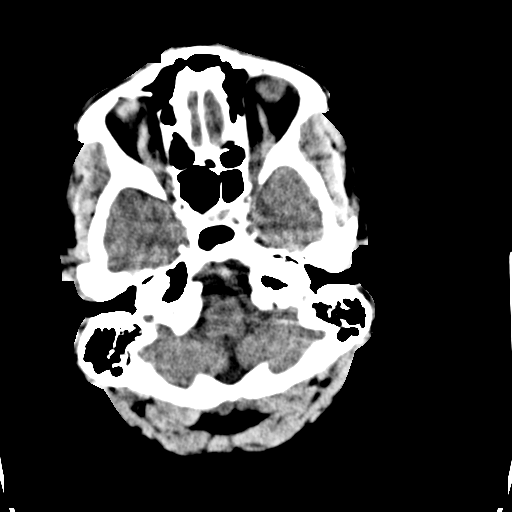
[im 6/29  brain]
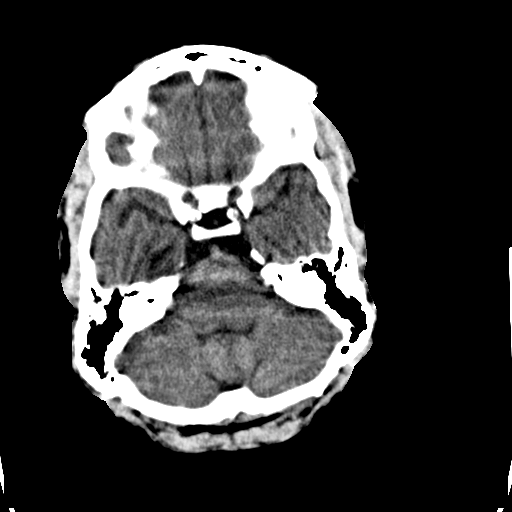
[im 8/29  brain]
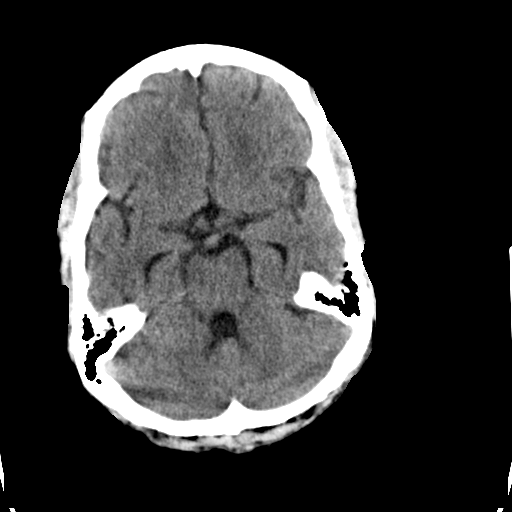
[im 10/29  brain]
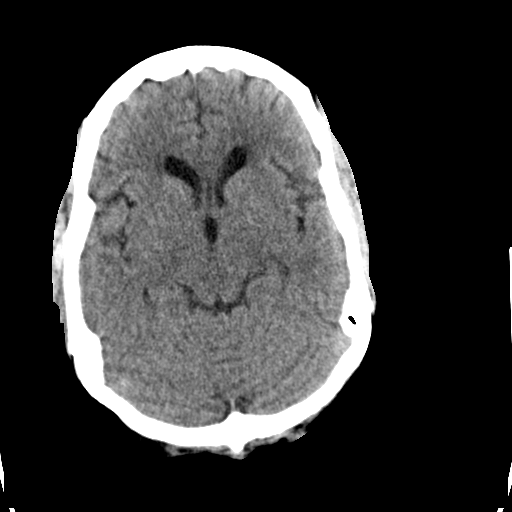
[im 10/29  bone]
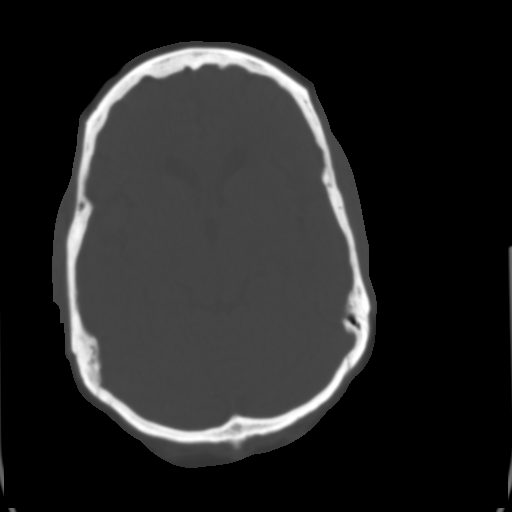
[im 12/29  brain]
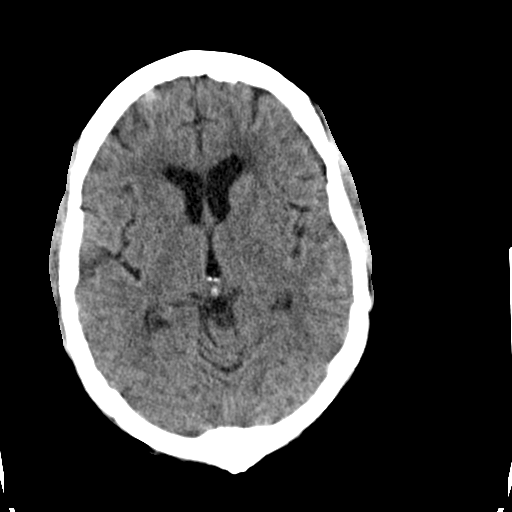
[im 14/29  brain]
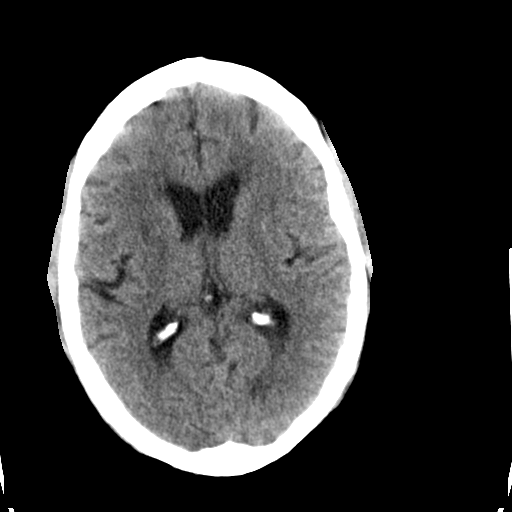
[im 15/29  brain]
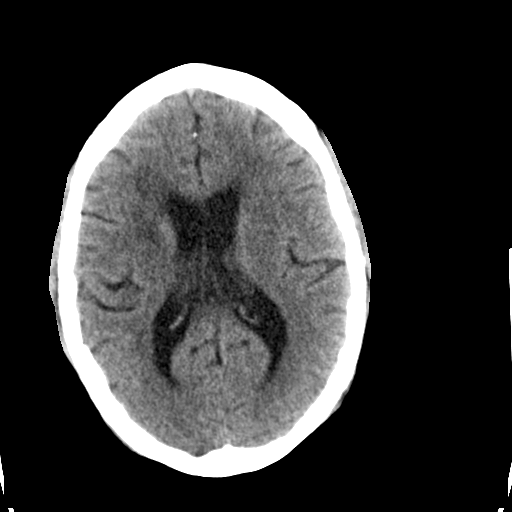
[im 17/29  brain]
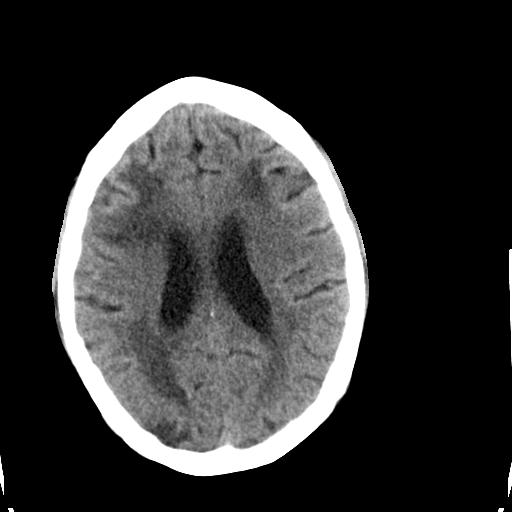
[im 17/29  bone]
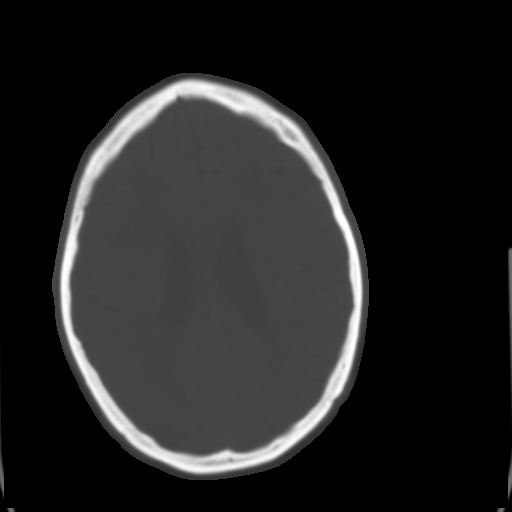
[im 19/29  brain]
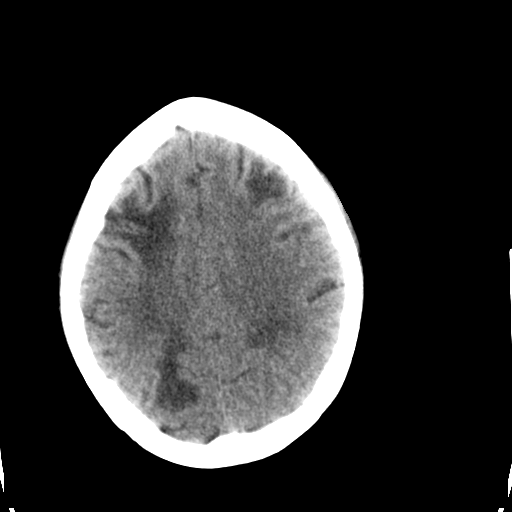
[im 21/29  brain]
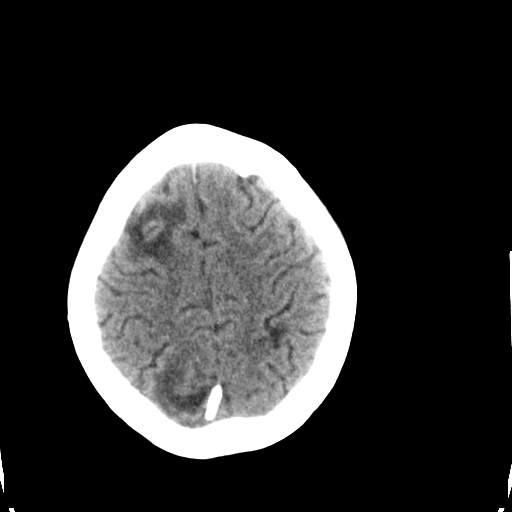
[im 23/29  brain]
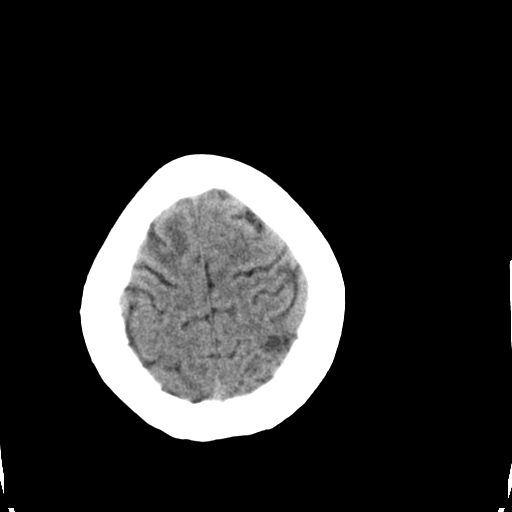
[im 25/29  brain]
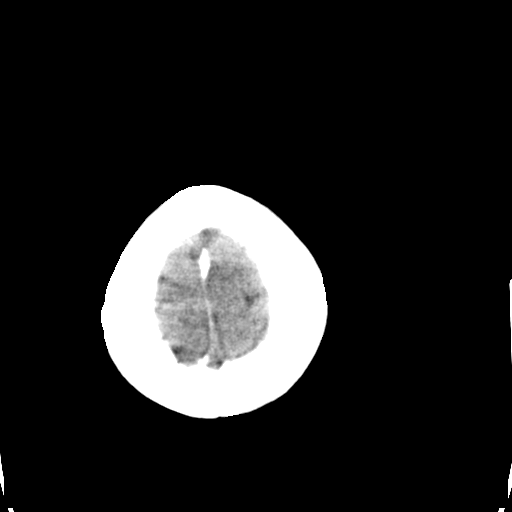
[im 25/29  bone]
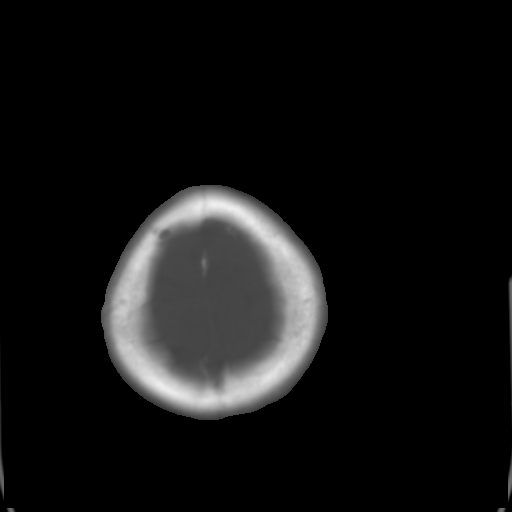
[im 27/29  brain]
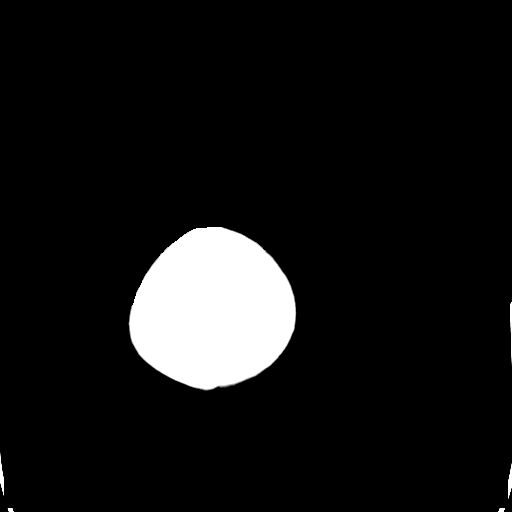

[14 of 30 positions shown; findings below may reference images not displayed]

FINDINGS: There is no evidence of acute infarction, mass lesion, or intra- or
extra-axial hemorrhage on CT.

Chronic bilateral frontal and parietal infarcts are grossly
unchanged from prior studies. Associated encephalomalacia is seen.
Prominence of the ventricles suggests mild cortical volume loss.

The posterior fossa, including the cerebellum, brainstem and fourth
ventricle, is within normal limits. The basal ganglia are
unremarkable in appearance. No mass effect or midline shift is seen.

There is no evidence of fracture; visualized osseous structures are
unremarkable in appearance. The visualized portions of the orbits
are within normal limits. The paranasal sinuses and mastoid air
cells are well-aerated. No significant soft tissue abnormalities are
seen.
IMPRESSION: 1. No acute intracranial pathology seen on CT.
2. Chronic bilateral frontal and parietal infarcts are grossly
unchanged from prior studies. Associated encephalomalacia seen.
3. Suggestion of mild cortical volume loss.

## 2015-11-09 IMAGING — CR DG CHEST 1V PORT
1 series · 1 of 1 positions shown · non-contrast
Comparison: 10/08/2013

CLINICAL DATA: Chest pain

EXAM:
PORTABLE CHEST - 1 VIEW

[ap]
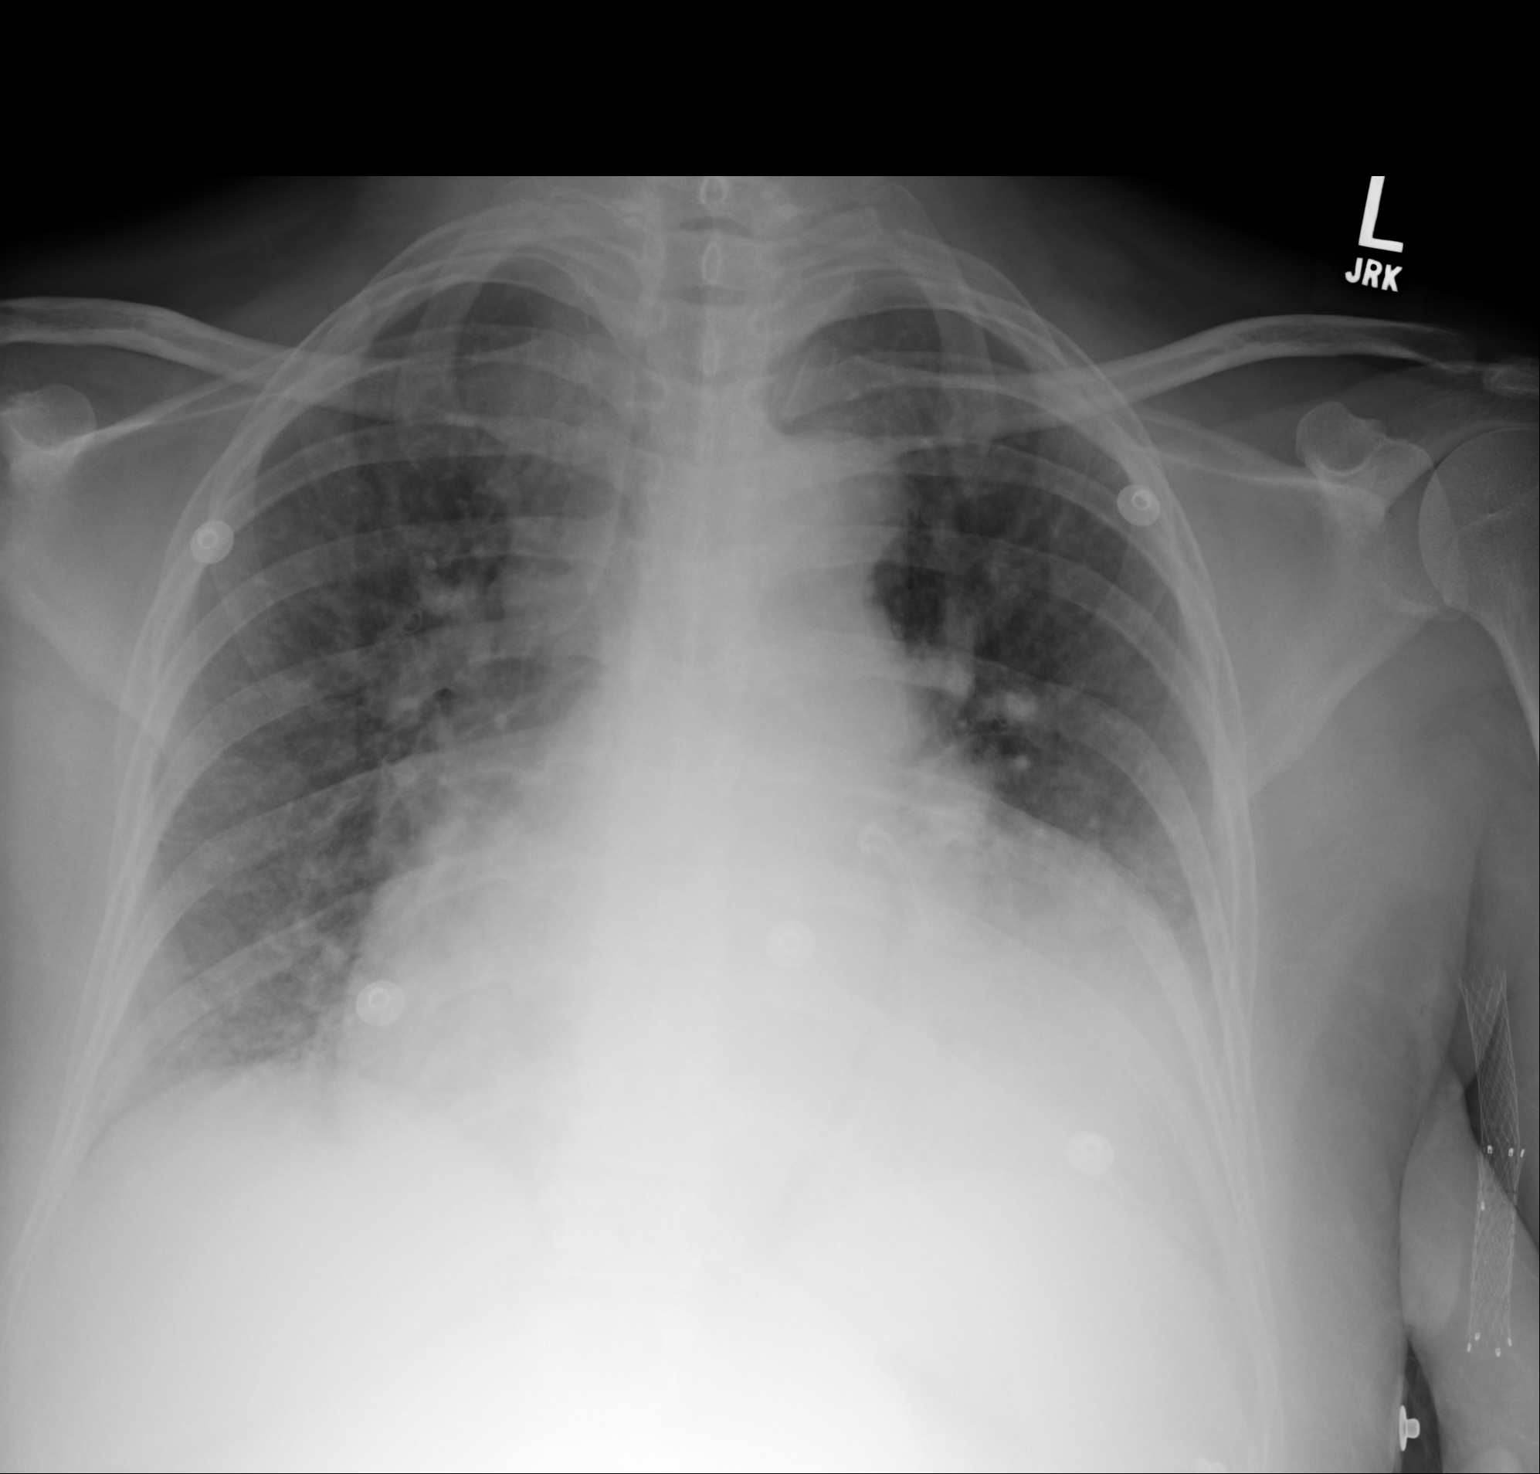

[1 of 1 positions shown; findings below may reference images not displayed]

FINDINGS: Cardiomegaly with mild interstitial edema. Possible small left
pleural effusion. No pneumothorax.
IMPRESSION: Cardiomegaly with mild interstitial edema and possible small left
pleural effusion.

## 2015-12-13 IMAGING — CR DG CHEST 1V PORT
1 series · 1 of 1 positions shown · non-contrast
Comparison: 11/25/2013

CLINICAL DATA: Chest pain

EXAM:
PORTABLE CHEST - 1 VIEW

[ap]
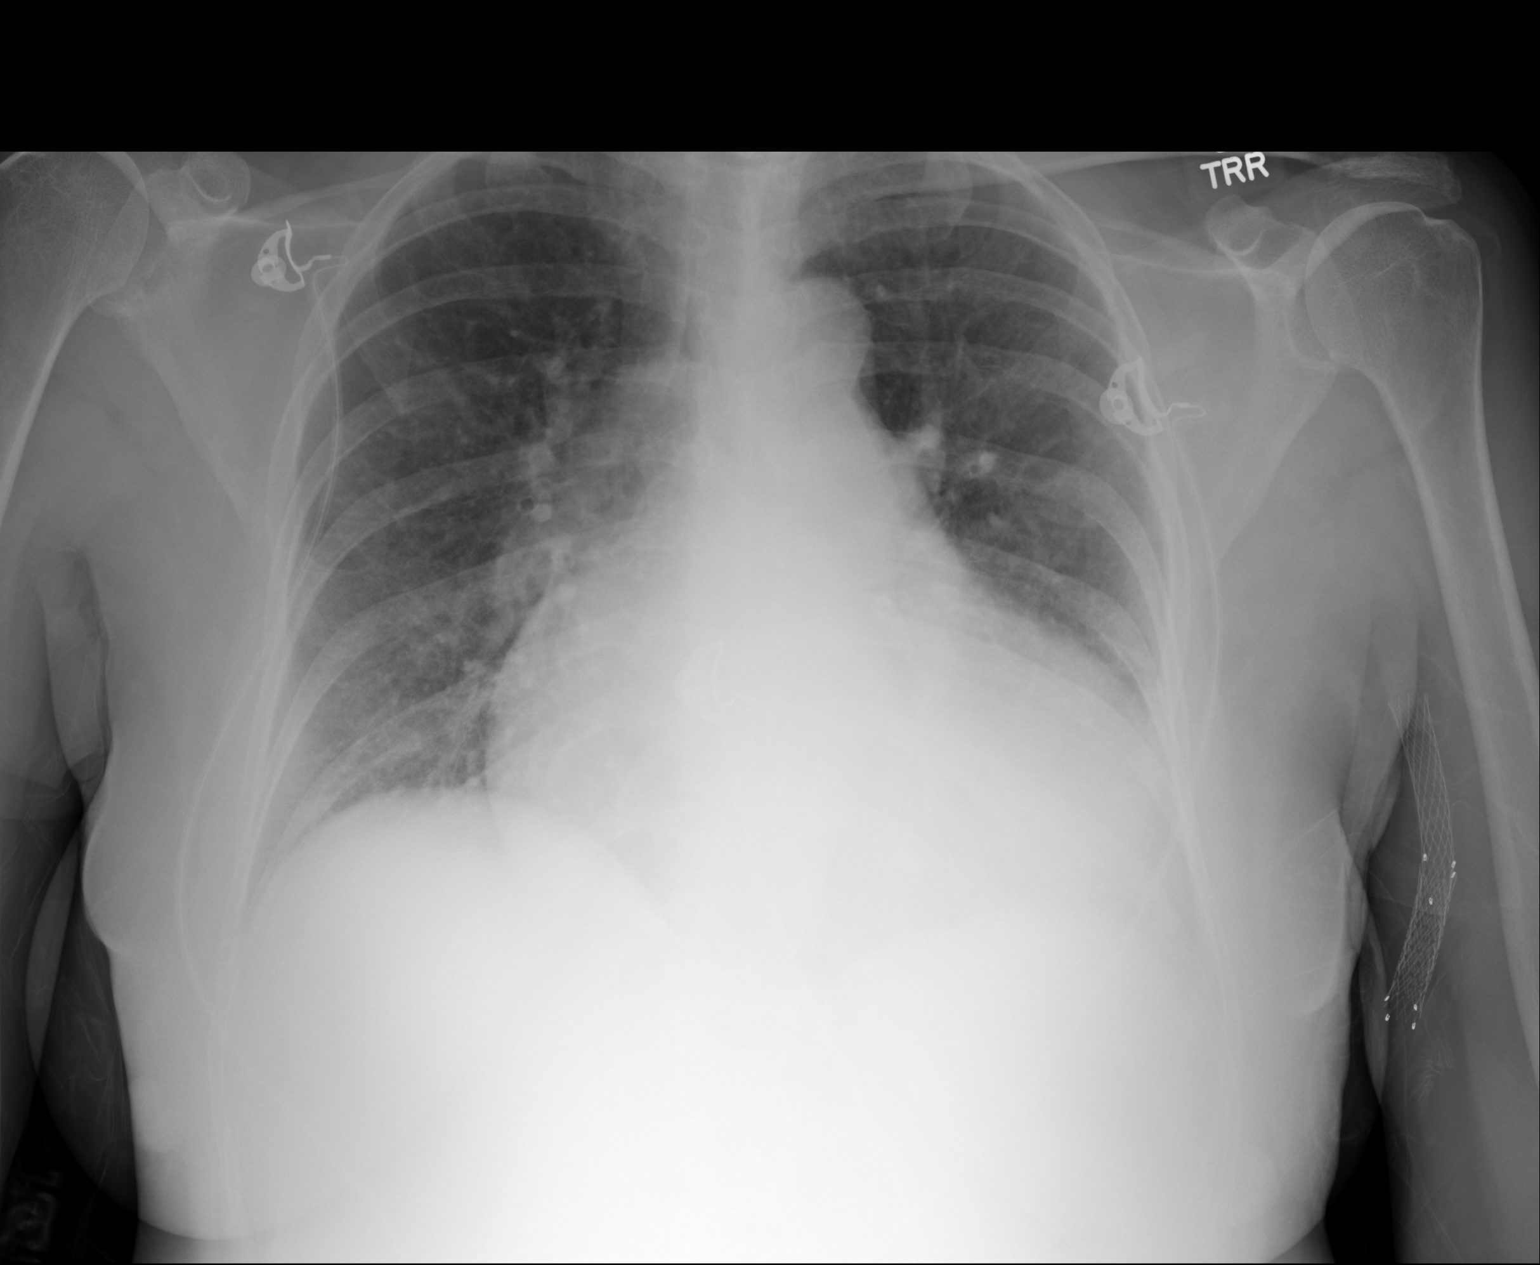

[1 of 1 positions shown; findings below may reference images not displayed]

FINDINGS: There is chronic marked cardiopericardial enlargement. Coronary
atherosclerosis or stent noted overlapping the left upper heart.
Upper mediastinal contours are negative. There is diffuse
interstitial coarsening from pulmonary venous congestion. No
asymmetric opacity or pleural effusion. No pneumothorax. Vascular
stent noted in the left arm.
IMPRESSION: Cardiomegaly and pulmonary venous congestion.

## 2016-01-13 ENCOUNTER — Emergency Department: Payer: Medicare Other

## 2016-01-13 ENCOUNTER — Emergency Department
Admission: EM | Admit: 2016-01-13 | Discharge: 2016-01-13 | Disposition: A | Discharge status: Home / Self Care | Payer: Medicare Other | Attending: Emergency Medicine | Admitting: Emergency Medicine

## 2016-01-13 ENCOUNTER — Encounter: Payer: Self-pay | Admitting: Emergency Medicine

## 2016-01-13 DIAGNOSIS — I132 Hypertensive heart and chronic kidney disease with heart failure and with stage 5 chronic kidney disease, or end stage renal disease: Secondary | ICD-10-CM | POA: Diagnosis not present

## 2016-01-13 DIAGNOSIS — Z87891 Personal history of nicotine dependence: Secondary | ICD-10-CM | POA: Diagnosis not present

## 2016-01-13 DIAGNOSIS — E785 Hyperlipidemia, unspecified: Secondary | ICD-10-CM | POA: Diagnosis not present

## 2016-01-13 DIAGNOSIS — Z79899 Other long term (current) drug therapy: Secondary | ICD-10-CM | POA: Insufficient documentation

## 2016-01-13 DIAGNOSIS — Z7982 Long term (current) use of aspirin: Secondary | ICD-10-CM | POA: Diagnosis not present

## 2016-01-13 DIAGNOSIS — R0602 Shortness of breath: Secondary | ICD-10-CM | POA: Insufficient documentation

## 2016-01-13 DIAGNOSIS — Z8673 Personal history of transient ischemic attack (TIA), and cerebral infarction without residual deficits: Secondary | ICD-10-CM | POA: Diagnosis not present

## 2016-01-13 DIAGNOSIS — R1111 Vomiting without nausea: Secondary | ICD-10-CM

## 2016-01-13 DIAGNOSIS — R109 Unspecified abdominal pain: Secondary | ICD-10-CM

## 2016-01-13 DIAGNOSIS — Z992 Dependence on renal dialysis: Secondary | ICD-10-CM | POA: Insufficient documentation

## 2016-01-13 DIAGNOSIS — R112 Nausea with vomiting, unspecified: Secondary | ICD-10-CM | POA: Insufficient documentation

## 2016-01-13 DIAGNOSIS — N186 End stage renal disease: Secondary | ICD-10-CM | POA: Diagnosis not present

## 2016-01-13 DIAGNOSIS — Z7901 Long term (current) use of anticoagulants: Secondary | ICD-10-CM | POA: Diagnosis not present

## 2016-01-13 DIAGNOSIS — I252 Old myocardial infarction: Secondary | ICD-10-CM | POA: Diagnosis not present

## 2016-01-13 DIAGNOSIS — I509 Heart failure, unspecified: Secondary | ICD-10-CM | POA: Insufficient documentation

## 2016-01-13 DIAGNOSIS — R51 Headache: Secondary | ICD-10-CM | POA: Diagnosis not present

## 2016-01-13 LAB — COMPREHENSIVE METABOLIC PANEL
ALT: 84 U/L — AB (ref 14–54)
AST: 79 U/L — AB (ref 15–41)
Albumin: 3.6 g/dL (ref 3.5–5.0)
Alkaline Phosphatase: 189 U/L — ABNORMAL HIGH (ref 38–126)
Anion gap: 15 (ref 5–15)
BILIRUBIN TOTAL: 1.2 mg/dL (ref 0.3–1.2)
BUN: 50 mg/dL — AB (ref 6–20)
CO2: 23 mmol/L (ref 22–32)
CREATININE: 9.68 mg/dL — AB (ref 0.44–1.00)
Calcium: 7.8 mg/dL — ABNORMAL LOW (ref 8.9–10.3)
Chloride: 97 mmol/L — ABNORMAL LOW (ref 101–111)
GFR, EST AFRICAN AMERICAN: 5 mL/min — AB (ref >60)
GFR, EST NON AFRICAN AMERICAN: 4 mL/min — AB (ref >60)
Glucose, Bld: 87 mg/dL (ref 65–99)
Potassium: 4 mmol/L (ref 3.5–5.1)
Sodium: 135 mmol/L (ref 135–145)
TOTAL PROTEIN: 8.5 g/dL — AB (ref 6.5–8.1)

## 2016-01-13 LAB — CBC
HEMATOCRIT: 37 % (ref 35.0–47.0)
Hemoglobin: 12.4 g/dL (ref 12.0–16.0)
MCH: 28.7 pg (ref 26.0–34.0)
MCHC: 33.6 g/dL (ref 32.0–36.0)
MCV: 85.2 fL (ref 80.0–100.0)
Platelets: 151 10*3/uL (ref 150–440)
RBC: 4.34 MIL/uL (ref 3.80–5.20)
RDW: 17.6 % — AB (ref 11.5–14.5)
WBC: 6.4 10*3/uL (ref 3.6–11.0)

## 2016-01-13 LAB — TROPONIN I: TROPONIN I: 0.06 ng/mL — AB (ref <0.03)

## 2016-01-13 LAB — LIPASE, BLOOD: LIPASE: 59 U/L — AB (ref 11–51)

## 2016-01-13 MED ORDER — ONDANSETRON 4 MG PO TBDP: ORAL_TABLET | ORAL | 0 refills | Status: DC

## 2016-01-13 MED ORDER — HYDROMORPHONE HCL 1 MG/ML IJ SOLN
0.5000 mg | INTRAMUSCULAR | Status: AC
  Administered 2016-01-13: 0.5 mg via INTRAVENOUS
  Filled 2016-01-13: qty 1

## 2016-01-13 MED ORDER — ONDANSETRON HCL 4 MG/2ML IJ SOLN
4.0000 mg | Freq: Once | INTRAMUSCULAR | Status: DC
  Filled 2016-01-13: qty 2

## 2016-01-13 MED ORDER — ONDANSETRON HCL 4 MG/2ML IJ SOLN
4.0000 mg | Freq: Once | INTRAMUSCULAR | Status: AC
  Administered 2016-01-13: 4 mg via INTRAVENOUS

## 2016-01-13 NOTE — ED Notes (Signed)
MD re-explained situation to patient and family discharge

## 2016-01-13 NOTE — ED Provider Notes (Signed)
-----------------------------------------   3:45 PM on 01/13/2016 -----------------------------------------   Blood pressure 123/87, pulse 98, temperature 98.5 F (36.9 C), temperature source Oral, resp. rate (!) 23, height 5\' 6"  (1.676 m), weight 79.4 kg, SpO2 92 %.  Assuming care from Dr. Jacqualine Code.  In short, [name] is a 55 y.o. female with a chief complaint of Migraine (since last night) and Shortness of Breath .  Refer to the original H&P for additional details.  The current plan of care is to coordinate with Dr. Juleen China and reassess the patient.  ----------------------------------------- 4:42 PM on 01/13/2016 -----------------------------------------  Dr. Juleen China came by and spoke with me in person.  He reports that he sees no reason the patient needs to be admitted and thinks that she can follow-up on Wednesday for dialysis.  She is currently happy, cheerful, talking sidedly, and states she is ready to go home with no residual nausea.  She asked for pain medicine and nausea medicine but I told her the chronic pain will not be treated here and she can have some Zofran.  She agrees with this plan and said that that is not a problem for her.  She knows the usual return precautions.    Hinda Kehr, MD 01/13/16 [phone]

## 2016-01-13 NOTE — ED Notes (Signed)
Dialysis nurse de-accessed line. No bleeding noted.

## 2016-01-13 NOTE — ED Triage Notes (Signed)
Pt presents to ED from  [facility] street dialysis center for a headache since last night with congestion and SOB. Pt finished 15 minutes of dialysis before arrival. Pt alert and oriented. In no acute distress.

## 2016-01-13 NOTE — ED Provider Notes (Signed)
[facility] Emergency Department Provider Note   ____________________________________________   First MD Initiated Contact with Patient 01/13/16 1149     (approximate)  I have reviewed the triage vital signs and the nursing notes.   HISTORY  Chief Complaint Migraine (since last night) and Shortness of Breath    HPI [name] is a 55 y.o. female who presents for evaluationfrom dialysis.  The patient tells me that she is here because of a headache which started last night, and also that she's been having abdominal pain and nausea for the last 2 days.  The patient told triage nurse that she's been having cough and shortness of breath, and she endorsed to me that she has chronic shortness of breath. She also tells me that she has been having a cough with is been ongoing for a long time and has been treated with antibiotics with no significant improvement in the past.  Presently she tells me her concerns that she has her nausea, vomiting, and a headache. She is gravida whole body pounding discomfort and headache, states she's had this many times before. No fever, no neck pain or stiffness.  Chest patient she didn't vomiting occasionally for the last 2 days, having pain in the upper right abdomen.     Past Medical History:  Diagnosis Date  . Heart failure ([place])   . Hyperlipidemia   . Hypertension   . Myocardial infarction ([place])   . Renal failure   . Stroke [facility]) 2011    Patient Active Problem List   Diagnosis Date Noted  . Hyperkalemia 11/13/2014  . Generalized weakness 10/15/2014  . Chest pain 10/15/2014  . ESRD on hemodialysis ([place]) 10/15/2014  . Congestive heart failure (CHF) ([place]) 10/15/2014  . HTN (hypertension) 10/15/2014  . Retrosternal chest pain 05/30/2013  . Gallstones 05/29/2013    Past Surgical History:  Procedure Laterality Date  . CORONARY ANGIOPLASTY WITH STENT PLACEMENT  2013  . DIALYSIS FISTULA CREATION       Prior to Admission medications   Medication Sig Start Date End Date Taking? Authorizing Provider  amiodarone (PACERONE) 200 MG tablet Take 200 mg by mouth daily.    Historical Provider, MD  amitriptyline (ELAVIL) 25 MG tablet Take 25 mg by mouth at bedtime.    Historical Provider, MD  aspirin 81 MG tablet Take 81 mg by mouth daily.    Historical Provider, MD  docusate sodium (COLACE) 100 MG capsule Take 100 mg by mouth 2 (two) times daily.    Historical Provider, MD  HYDROcodone-acetaminophen (NORCO/VICODIN) 5-325 MG tablet Take 1 tablet by mouth every 4 (four) hours as needed for moderate pain. 07/18/15   Harvest Dark, MD  hydrOXYzine (ATARAX/VISTARIL) 25 MG tablet Take 1 tablet by mouth 3 (three) times daily. 05/18/13   Historical Provider, MD  midodrine (PROAMATINE) 10 MG tablet Take 10 mg by mouth 1 day or 1 dose.    Historical Provider, MD  pantoprazole (PROTONIX) 40 MG tablet Take 1 tablet by mouth daily. 05/07/13   Historical Provider, MD  sennosides-docusate sodium (SENOKOT-S) 8.6-50 MG tablet Take 2 tablets by mouth daily.    Historical Provider, MD  SENSIPAR 30 MG tablet Take 1 tablet by mouth daily. 05/10/13   Historical Provider, MD  sevelamer carbonate (RENVELA) 800 MG tablet Take 1,600 mg by mouth 3 (three) times daily with meals.    Historical Provider, MD  warfarin (COUMADIN) 2.5 MG tablet Take 2.5 mg by mouth daily.    Historical Provider,  MD    Allergies Morphine and related; Adhesive [tape]; and Latex  Family History  Problem Relation Age of Onset  . Hypertension    . Cancer    . Renal Disease      Social History Social History  Substance Use Topics  . Smoking status: Former Smoker    Years: 20.00    Types: Cigarettes  . Smokeless tobacco: Not on file  . Alcohol use No    Review of Systems Constitutional: No fever/chills. Feels fatigued Eyes: No visual changes. ENT: No sore throat. Cardiovascular: Denies chest pain. Respiratory: No new shortness of  breath, states she chronically has mild shortness of breath. Gastrointestinal: See history of present illness No diarrhea.  No constipation. Genitourinary: Does not urinate Musculoskeletal: Negative for back pain. Skin: Negative for rash. Neurological: Negative for new focal weakness or numbness. She has chronic unchanged mild weakness in her right arm and right leg.  10-point ROS otherwise negative.  ____________________________________________   PHYSICAL EXAM:  VITAL SIGNS: ED Triage Vitals  Enc Vitals Group     BP 01/13/16 1136 123/87     Pulse Rate 01/13/16 1136 89     Resp 01/13/16 1136 18     Temp 01/13/16 1136 98.5 F (36.9 C)     Temp Source 01/13/16 1136 Oral     SpO2 01/13/16 1136 92 %     Weight 01/13/16 1136 175 lb (79.4 kg)     Height 01/13/16 1136 5\' 6"  (1.676 m)     Head Circumference --      Peak Flow --      Pain Score 01/13/16 1137 9     Pain Loc --      Pain Edu? --      Excl. in [place]? --     Constitutional: Alert and oriented. Well appearing and in no acute distress. Eyes: Conjunctivae are normal. PERRL. EOMI. Head: Atraumatic. Nose: No congestion/rhinnorhea. Mouth/Throat: Mucous membranes are Dry.  Oropharynx non-erythematous. Neck: No stridor.  No meningismus. Cardiovascular: Normal rate, regular rhythm. Grossly normal heart sounds.  Good peripheral circulation. Respiratory: Normal respiratory effort.  No retractions. Lungs CTAB. Patient able to sit up, demonstrates no evidence of dyspnea. Her lungs sound perfectly clear. She does have an occasional nonproductive cough. Gastrointestinal: Soft and nontender except for some moderate tenderness in the epigastrium, negative Murphy. No distention.  Musculoskeletal: No lower extremity tenderness nor edema.  No joint effusions. Neurologic:  Normal speech and language. No gross focal neurologic deficits are appreciated though she does seem to have mild weakness in the right lower leg compared with the left, and  she reports this is chronic.  Skin:  Skin is warm, dry and intact. No rash noted. Psychiatric: Mood and affect are normal. Speech and behavior are normal.  ____________________________________________   LABS (all labs ordered are listed, but only abnormal results are displayed)  Labs Reviewed  CBC - Abnormal; Notable for the following:       Result Value   RDW 17.6 (*)    All other components within normal limits  COMPREHENSIVE METABOLIC PANEL - Abnormal; Notable for the following:    Chloride 97 (*)    BUN 50 (*)    Creatinine, Ser 9.68 (*)    Calcium 7.8 (*)    Total Protein 8.5 (*)    AST 79 (*)    ALT 84 (*)    Alkaline Phosphatase 189 (*)    GFR calc non Af Amer 4 (*)  GFR calc Af Amer 5 (*)    All other components within normal limits  LIPASE, BLOOD - Abnormal; Notable for the following:    Lipase 59 (*)    All other components within normal limits  TROPONIN I - Abnormal; Notable for the following:    Troponin I 0.06 (*)    All other components within normal limits   ____________________________________________  EKG  Reviewed and interpreted by me at 1205 Normal sinus rhythm Heart rate 90 Care is 120 QTC 45 Occasional PVC, right bundle branch block, no evidence of ischemic change noted. ____________________________________________  RADIOLOGY  Dg Chest 2 View  Result Date: 01/13/2016 CLINICAL DATA:  Pt states SHOB AND headache on all of the right side x 1 week, has been sleeping with O2 @ night, hx of triple by pass surgery September 2015. EXAM: CHEST  2 VIEW COMPARISON:  11/13/2016 FINDINGS: Status post median sternotomy and CABG. The heart is enlarged. There is mild interstitial pulmonary edema. Opacity at the left lung base may be related to cardiomegaly and atelectasis. Additional opacity from consolidation and effusion cannot be excluded. The left hemidiaphragm is obscured. IMPRESSION: 1. Cardiomegaly and interstitial edema. 2. Possible left lower lobe  atelectasis or infiltrate. Electronically Signed   By: Nolon Nations M.D.   On: 01/13/2016 12:52  Ct Head Wo Contrast  Result Date: 01/13/2016 CLINICAL DATA:  Headache. EXAM: CT HEAD WITHOUT CONTRAST TECHNIQUE: Contiguous axial images were obtained from the base of the skull through the vertex without intravenous contrast. COMPARISON:  June 23, 2014 FINDINGS: Paranasal sinuses, mastoid air cells, middle ears, bones, and extracranial soft tissues are within normal limits. No subdural, epidural, or subarachnoid hemorrhage. Cerebellum, brainstem, and basal cisterns are normal. Ventricles and sulci are normal for age. Infarcts are again seen in the bilateral frontal lobes and right posterior parietal lobe, unchanged. Moderate severe white matter changes are noted as well. No acute cortical ischemia or infarct identified. No mass, mass effect, or midline shift. IMPRESSION: 1. No cause for acute headache identified. 2. Chronic bilateral infarcts and small vessel white matter ischemic changes. Electronically Signed   By: Dorise Bullion III M.D   On: 01/13/2016 12:57   US Abdomen Limited Ruq  Result Date: 01/13/2016 CLINICAL DATA:  Abdominal pain, generalized.  Symptoms for 1 week. EXAM: US ABDOMEN LIMITED - RIGHT UPPER QUADRANT COMPARISON:  06/13/2014 FINDINGS: Gallbladder: The gallbladder appears contracted. Gallbladder wall appears thickened, measuring up to 7 mm. Non mobile 2.9 mm echogenic foci favoring adenomyomatosis. Small on minimally mobile probable stone is identified within the region the gallbladder neck measuring 6 mm. Small amount of pericholecystic fluid. No sonographic Murphy's sign. Common bile duct: Diameter: 4.8 mm Liver: Normal echotexture. No focal hepatic mass. Small amount of perihepatic fluid. IMPRESSION: 1. Thickened gallbladder wall associated with contracted gallbladder, making it more difficult to fully evaluate the gallbladder. 2. Suspect gallbladder neck stone measuring 6 mm. 3.  Probable foci of adenomyomatosis. Electronically Signed   By: Nolon Nations M.D.   On: 01/13/2016 14:57   Symptomatology and ultrasound results discussed with Dr. Adonis Huguenin of general surgery. He advises that this finding may be seen in the gallbladder is contracted, signs data upon the possibility of acute cholecystitis. Though, he notes that the patient continues to have clinical symptoms such as ongoing abdominal pain a HIDA scan may help in evaluating for cholecystitis of these findings ultrasound do not clearly indicate. ____________________________________________   PROCEDURES  Procedure(s) performed: None  Procedures  Critical Care  performed: No  ____________________________________________   INITIAL IMPRESSION / ASSESSMENT AND PLAN / ED COURSE  Pertinent labs & imaging results that were available during my care of the patient were reviewed by me and considered in my medical decision making (see chart for details).  Patient returns for multiple concerns. She evidently has a cough, been having a headache, also nausea vomiting abdominal pain. I discussed her case and presentation, and the patient was seen by Dr. Abigail Butts, who knows her well.  Patient did exhibit vomiting here, and ultrasound does not clearly demonstrate cholecystitis, she has a history of gallstones and mild elevation in her transaminases that appears stable. She reports her pain and symptoms are better after Dilaudid, and she is resting comfortably at this time. She does not appear to have acute hypoxia, she saturating 94% on room air, and her EKG and troponin do not indicate acute cardiac. CT of head is negative, and her symptoms are much better after receiving medication.  ----------------------------------------- 3:55 PM on 01/13/2016 -----------------------------------------  Ongoing care assigned to Dr. Karma Greaser. Plan is to follow-up on her abdominal x-ray to evaluate for any evidence of obstruction, though  clinically I find this low. In addition, Dr. Abigail Butts will be coming to see the patient, and we will need to follow-up on his consultation and recommendations as he is very familiar with the patient and providers her dialysis care.  The patient is to have ongoing abdominal pain and emesis would likely admit her for a HIDA scan, but she continues to do well and is cleared to discharge by Dr. Abigail Butts, I think she can follow-up with them closely as an outpatient with dialysis likely tomorrow.  Clinical Course     ____________________________________________   FINAL CLINICAL IMPRESSION(S) / ED DIAGNOSES  Final diagnoses:  Abdominal pain      NEW MEDICATIONS STARTED DURING THIS VISIT:  New Prescriptions   No medications on file     Note:  This document was prepared using Dragon voice recognition software and may include unintentional dictation errors.     Delman Kitten, MD 01/13/16 1556

## 2016-01-13 NOTE — ED Notes (Signed)
Notified Dialysis nurse of pt with fistula access from previous dialysis center; Dialysis nurse to de-access fistula in 2 hours.

## 2016-01-13 NOTE — ED Notes (Signed)
MD Quale discussed with nephrologist about dialysis. Told to wait to de-access because she may have it today.

## 2016-01-13 NOTE — ED Notes (Signed)
MD at bedside due to vomiting; orders placed

## 2016-01-13 NOTE — Progress Notes (Signed)
Pt fistula de accessed. Prolonged bleeding >30 minutes. None when I left pt. No c/o. Gauze clean and dry.

## 2016-01-13 NOTE — Discharge Instructions (Signed)
You have been seen in the Emergency Department (ED) for abdominal pain and vomiting.  Your evaluation did not identify a clear cause of your symptoms but was generally reassuring. ° °Please follow up as instructed above regarding today?s emergent visit and the symptoms that are bothering you. ° °Return to the ED if your abdominal pain worsens or fails to improve, you develop bloody vomiting, bloody diarrhea, you are unable to tolerate fluids due to vomiting, fever greater than 101, or other symptoms that concern you. °

## 2016-01-20 IMAGING — CR DG CHEST 1V PORT
1 series · 1 of 1 positions shown · non-contrast
Comparison: 12/29/2013

CLINICAL DATA: Chest pain.

EXAM:
PORTABLE CHEST - 1 VIEW

[ap]
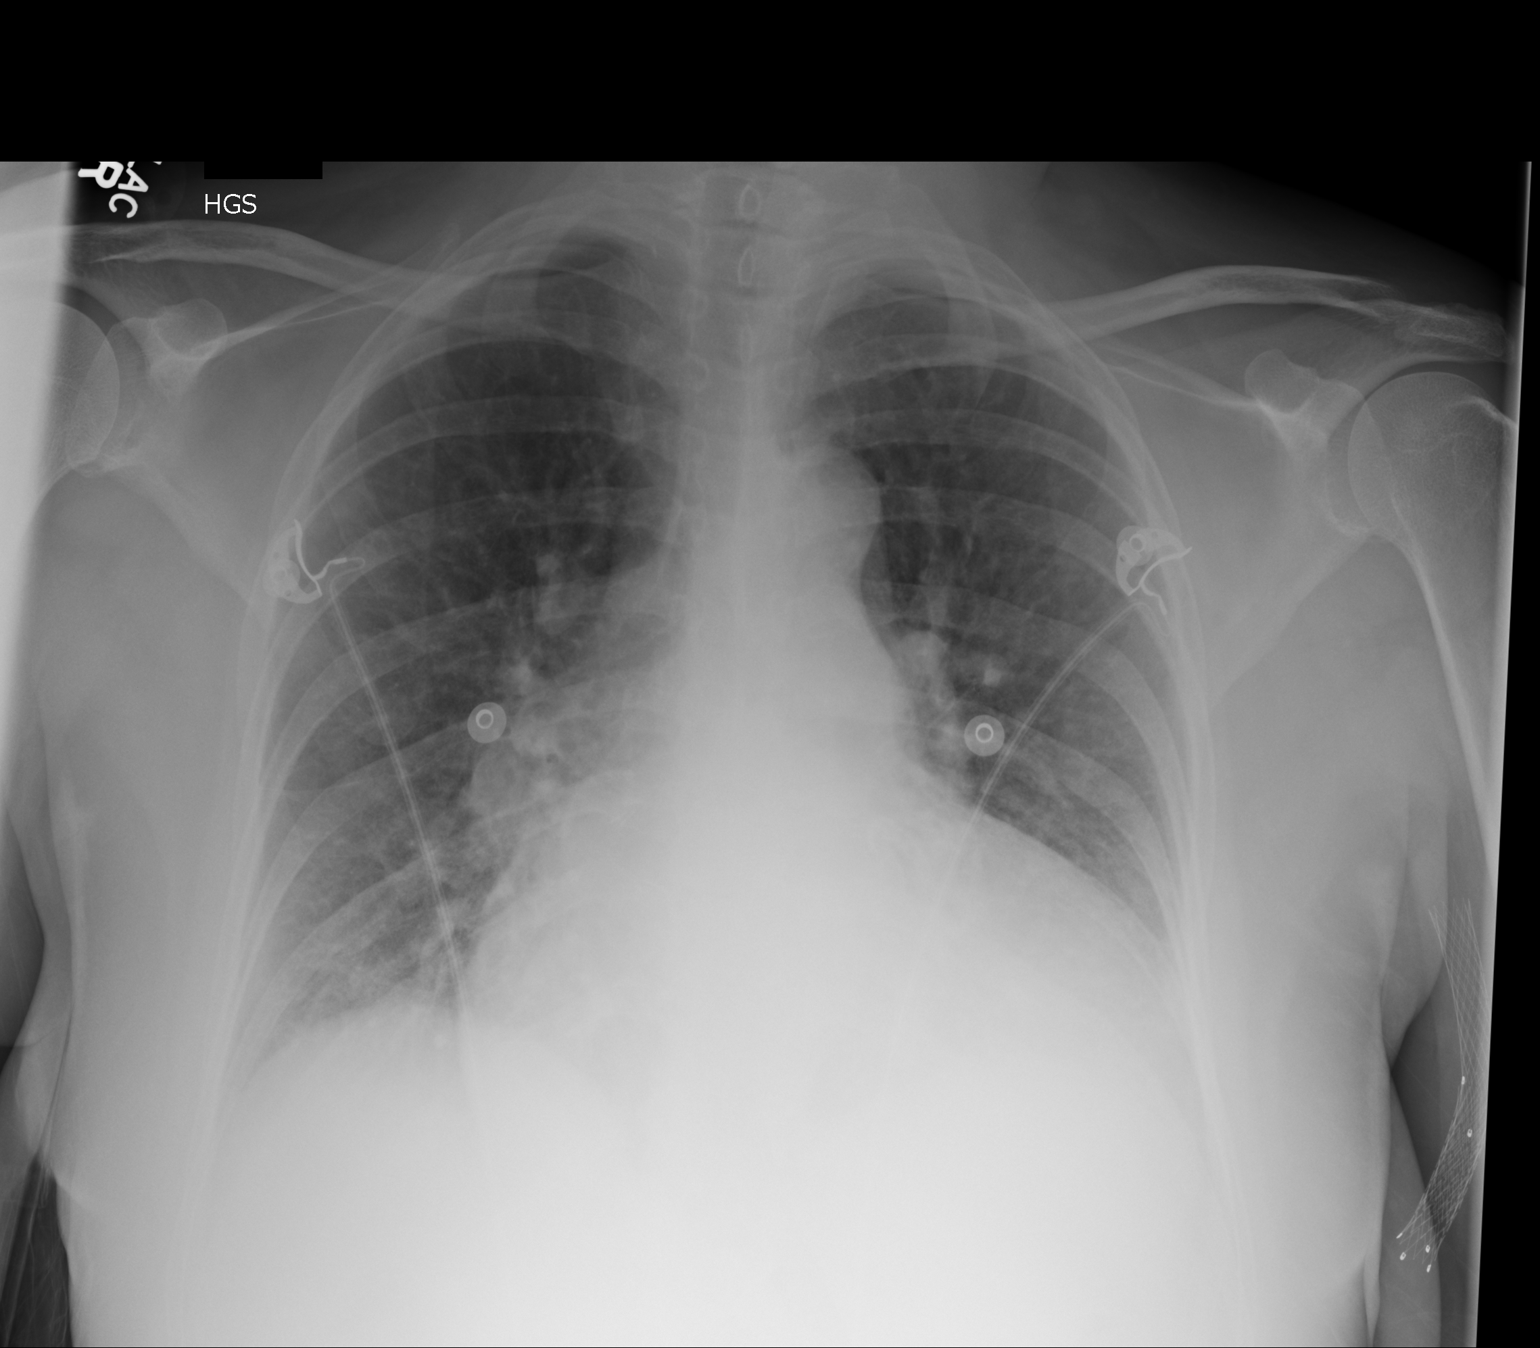

[1 of 1 positions shown; findings below may reference images not displayed]

FINDINGS: The heart is enlarged but stable. There is moderate central vascular
congestion but no overt pulmonary edema. No definite pleural
effusions. No pneumothorax. The bony thorax is intact.
IMPRESSION: Stable cardiac enlargement and moderate vascular congestion.

## 2016-01-21 IMAGING — CT CT ANGIO AORTIC DISSECTION W/ CM
2 of 6 series · 17 of 46 positions shown, 19 images · IV contrast (isovue)
Comparison: CT chest dated 10/10/2012

CLINICAL DATA: Chest pain

EXAM:
CT ANGIOGRAPHY CHEST WITH CONTRAST
TECHNIQUE: Multidetector CT imaging of the chest was performed using the
standard protocol during bolus administration of intravenous
contrast. Multiplanar CT image reconstructions and MIPs were
obtained to evaluate the vascular anatomy.
CONTRAST:  100 mL Isovue 370 IV

[Series 6: cta chest · axial · 0.70mm/px · z∈[-605,-349]mm · 14 of 142 slices shown, 16 images]
[im 7/142  soft-tissue]
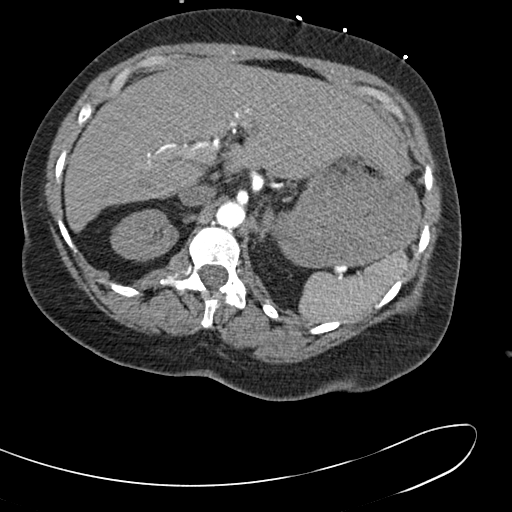
[im 7/142  bone]
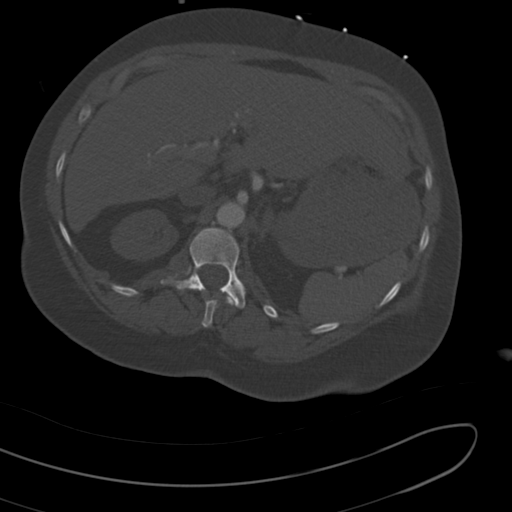
[im 20/142  soft-tissue]
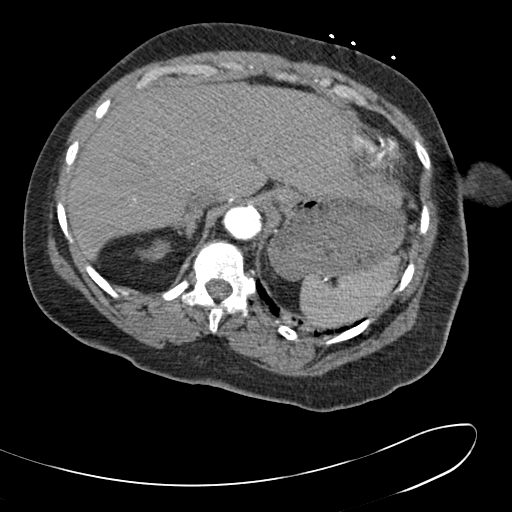
[im 26/142  soft-tissue]
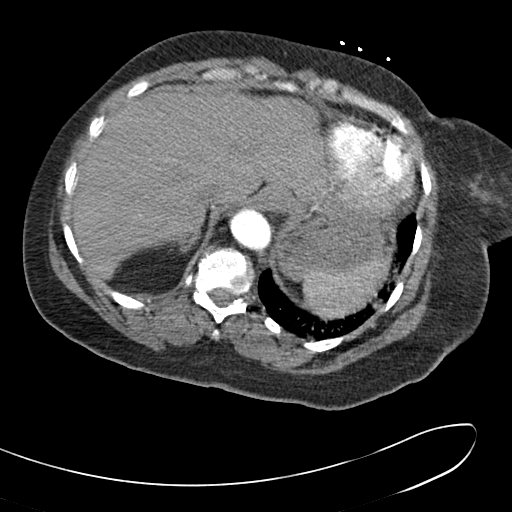
[im 39/142  soft-tissue]
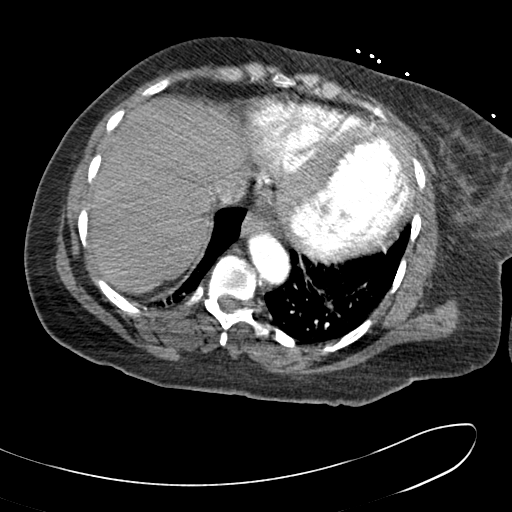
[im 45/142  soft-tissue]
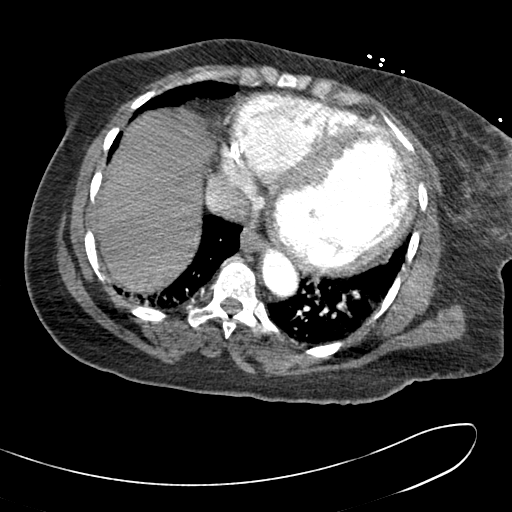
[im 58/142  soft-tissue]
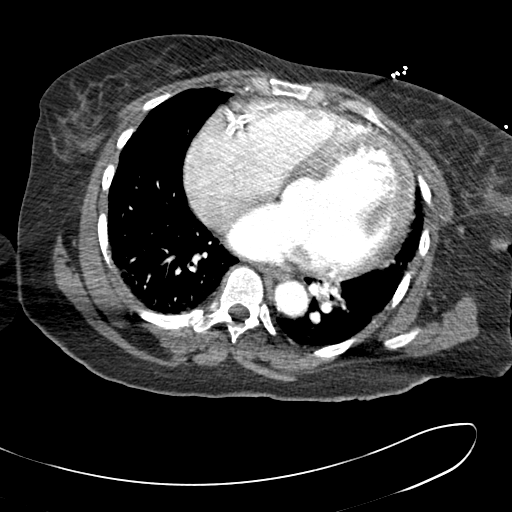
[im 65/142  soft-tissue]
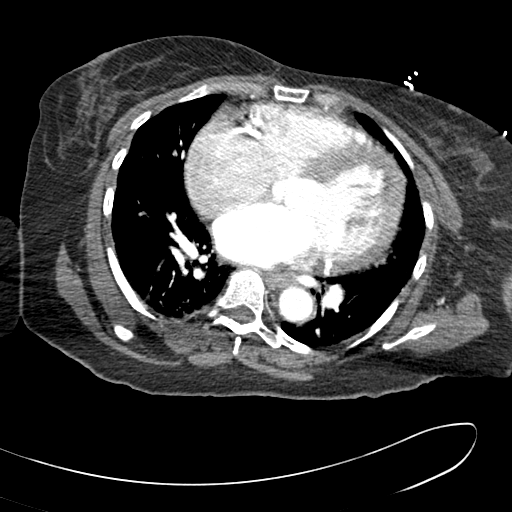
[im 77/142  soft-tissue]
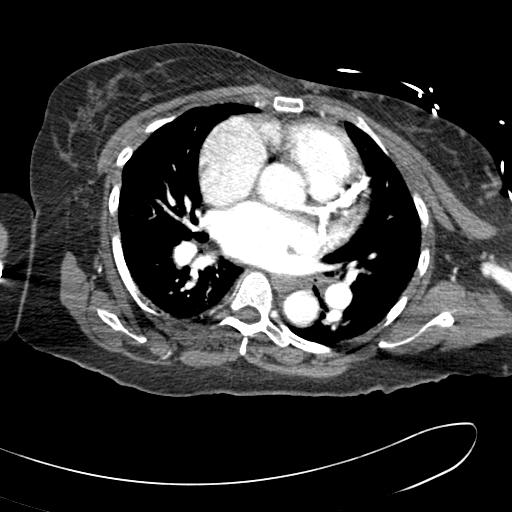
[im 84/142  soft-tissue]
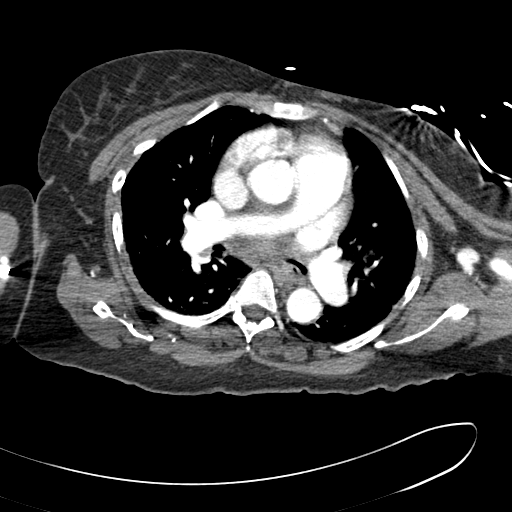
[im 84/142  bone]
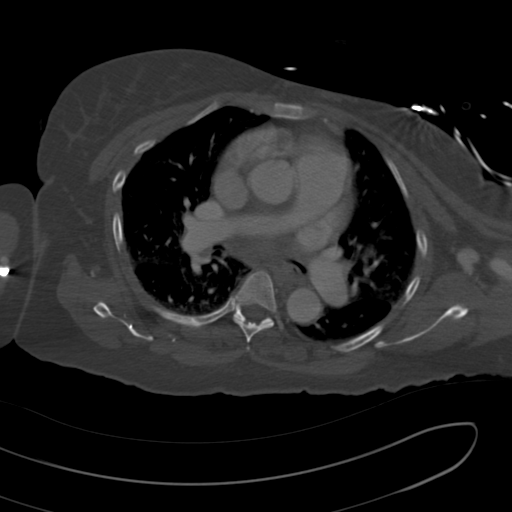
[im 97/142  soft-tissue]
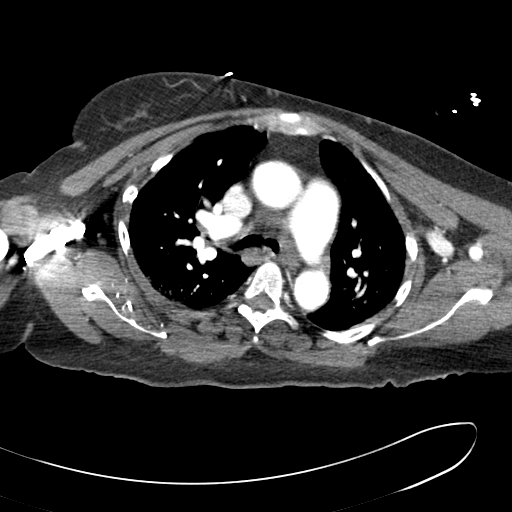
[im 103/142  soft-tissue]
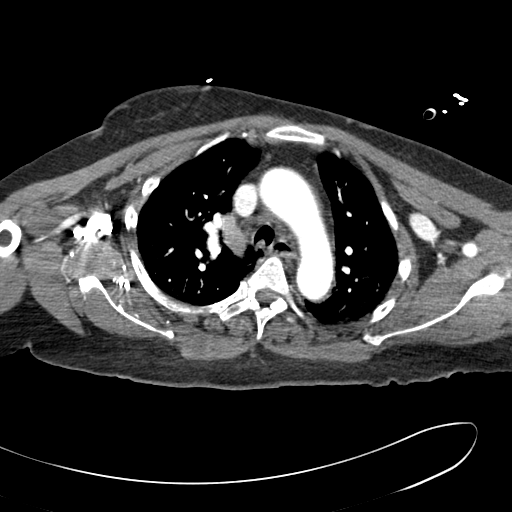
[im 116/142  soft-tissue]
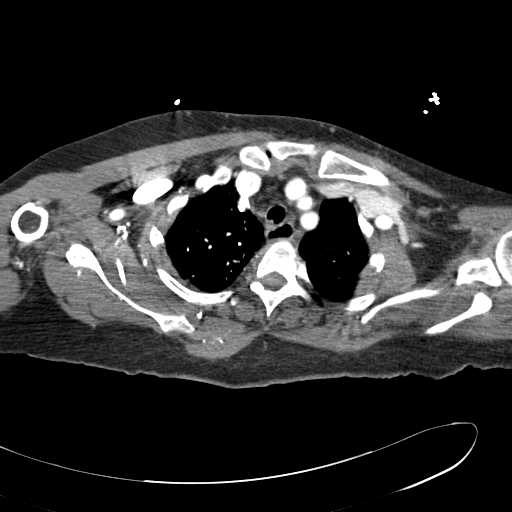
[im 122/142  soft-tissue]
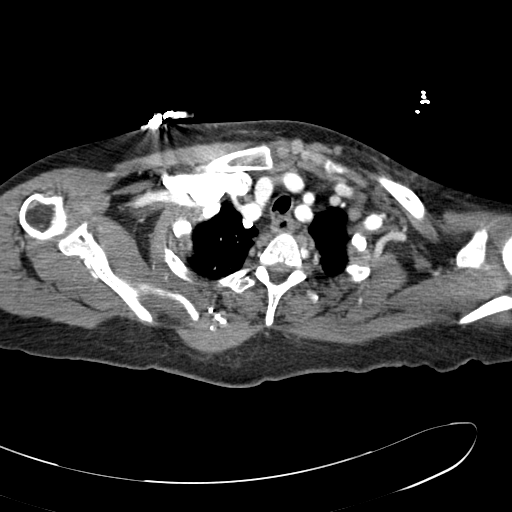
[im 135/142  soft-tissue]
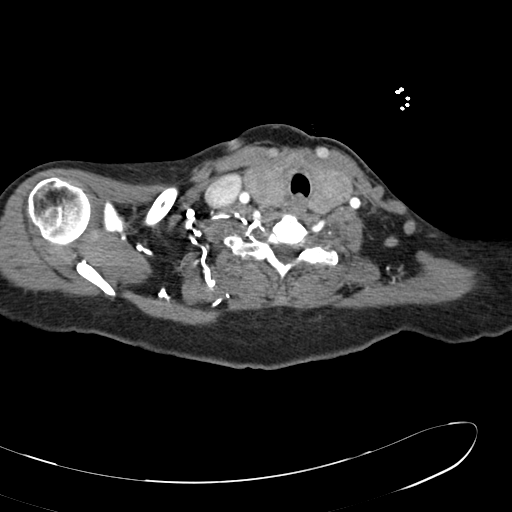

[Series 7: cor cta chest mpr · coronal · 0.66mm/px · 3 of 143 slices shown]
[im 36/143  soft-tissue]
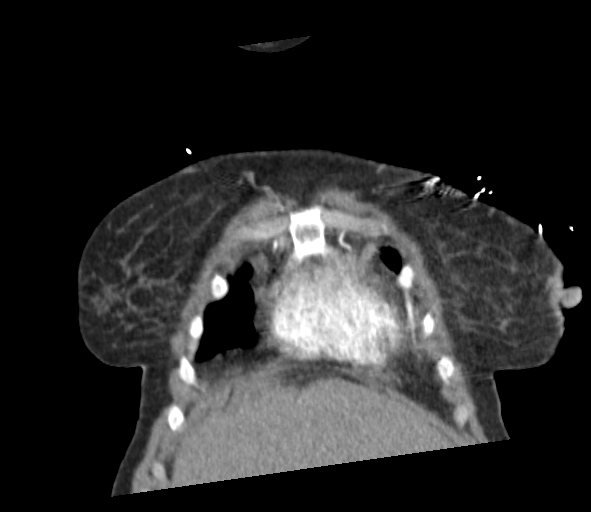
[im 72/143  soft-tissue]
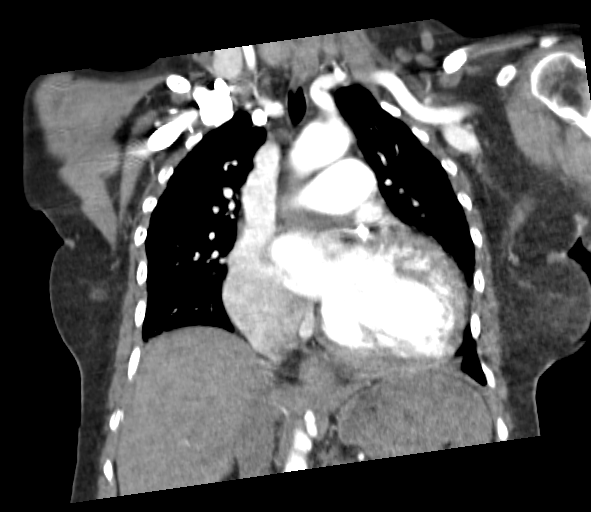
[im 107/143  soft-tissue]
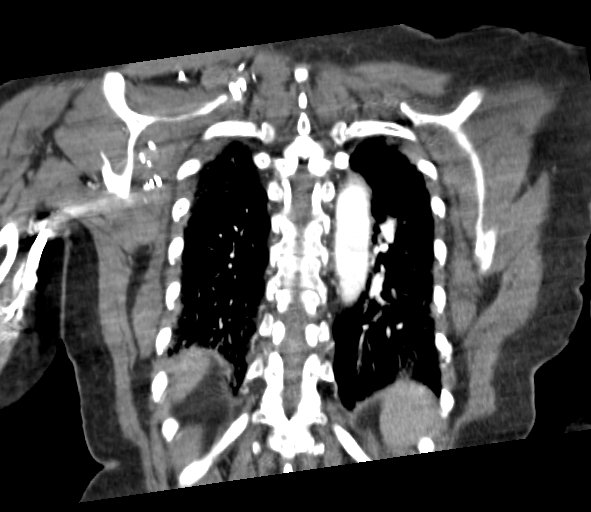

[17 of 46 positions shown; findings below may reference images not displayed]

FINDINGS: On unenhanced CT, there is no evidence of intramural hematoma.

Following contrast administration, there is no evidence of thoracic
aortic aneurysm or dissection.

Evaluation of lung parenchyma is constrained by respiratory motion.
Mild dependent atelectasis. No focal consolidation. No pleural
effusion or pneumothorax.

Thyroid is enlarged/heterogeneous with coarse calcifications on the
left.

Cardiomegaly.  No pericardial effusion.  Coronary atherosclerosis.

Mildly prominent lymph nodes, including a 12 mm subcarinal node
(series 6/image 27), unchanged.

Tracheobronchomalacia.

Visualized upper abdomen is grossly unremarkable.

Mild degenerative changes of the visualized thoracolumbar spine.

Review of the MIP images confirms the above findings.
IMPRESSION: No evidence of thoracic aortic aneurysm or dissection.

No evidence of acute cardiopulmonary disease.

Tracheobronchomalacia.

## 2016-01-24 IMAGING — CR DG CHEST 1V PORT
1 series · 1 of 1 positions shown · non-contrast
Comparison: 02/05/2014

CLINICAL DATA: Midsternal chest pain and epigastric pain.

EXAM:
PORTABLE CHEST - 1 VIEW

[ap]
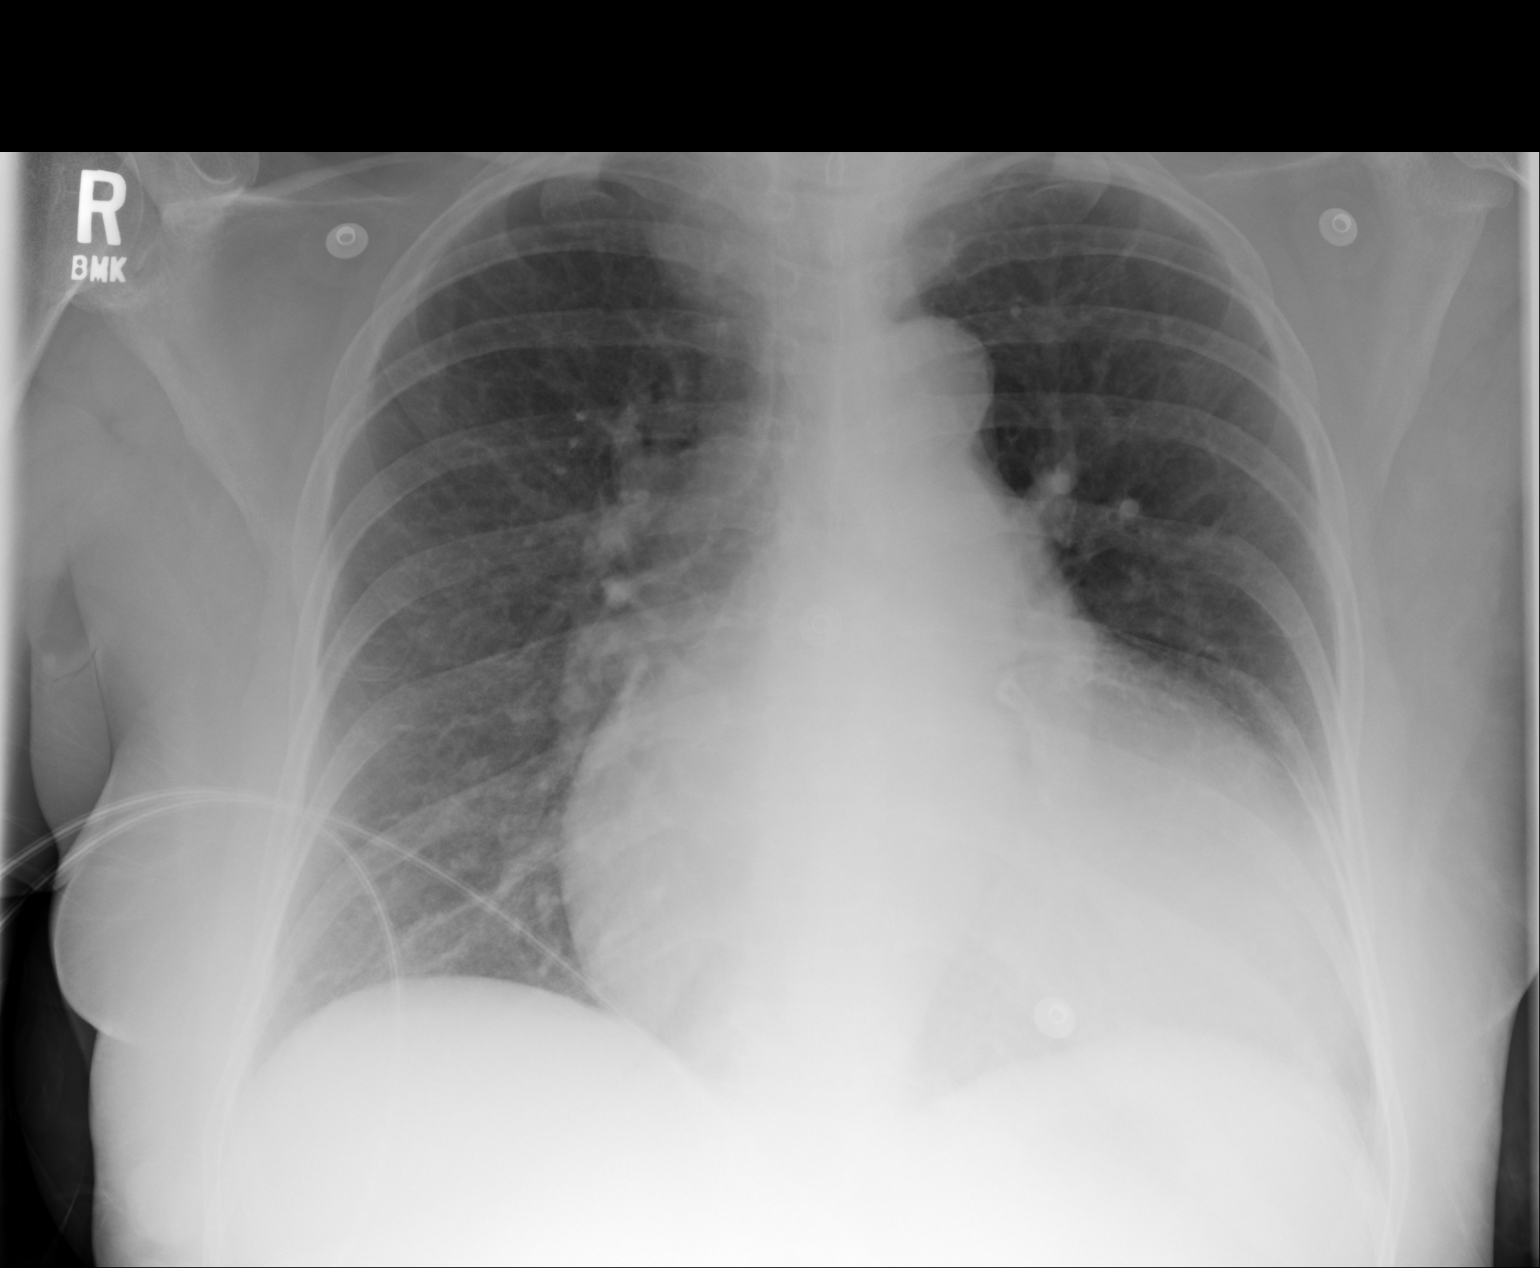

[1 of 1 positions shown; findings below may reference images not displayed]

FINDINGS: Diffuse cardiac enlargement. No significant vascular congestion or
edema. No focal airspace disease. No blunting of costophrenic
angles. No change since prior study.
IMPRESSION: Cardiac enlargement.  No evidence of active pulmonary disease.

## 2016-01-26 IMAGING — CR DG CHEST 1V PORT
1 series · 1 of 1 positions shown · non-contrast
Comparison: Chest radiograph February 09, 2014

CLINICAL DATA: Chest pain.

EXAM:
PORTABLE CHEST - 1 VIEW

[ap]
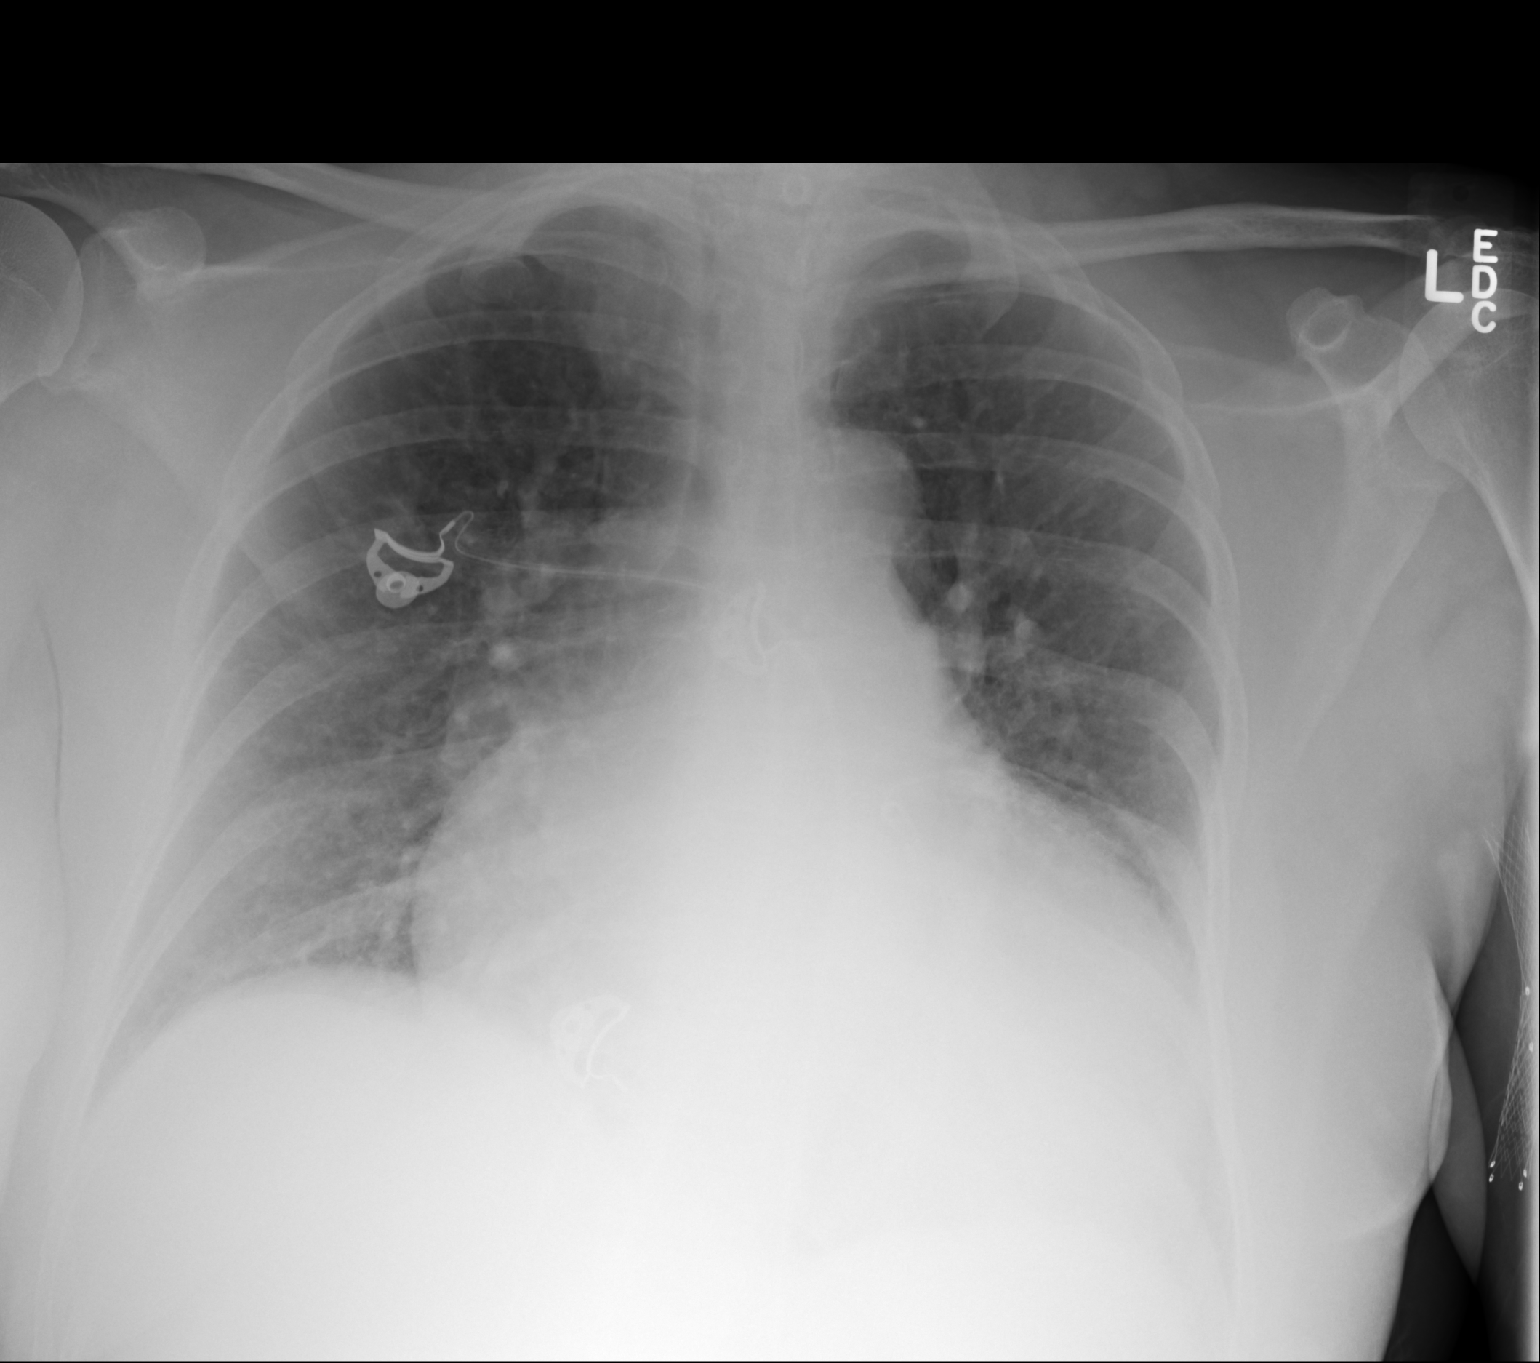

[1 of 1 positions shown; findings below may reference images not displayed]

FINDINGS: The cardiac silhouette remains at least moderately enlarged,
Coronary artery stent noted. Mediastinal silhouette is
nonsuspicious. Mild central pulmonary vascular congestion, similar.
No pleural effusions or focal consolidations. Mildly elevated right
hemidiaphragm. No pneumothorax. Soft tissue planes and included
osseous structures are nonsuspicious. Partially imaged vascular
stent left arm.
IMPRESSION: Stable cardiomegaly and central pulmonary vascular congestion.

  By: Kelia Stull

## 2016-02-26 ENCOUNTER — Encounter (INDEPENDENT_AMBULATORY_CARE_PROVIDER_SITE_OTHER): Payer: Self-pay

## 2016-03-23 ENCOUNTER — Other Ambulatory Visit (INDEPENDENT_AMBULATORY_CARE_PROVIDER_SITE_OTHER): Payer: Self-pay | Admitting: Vascular Surgery

## 2016-03-23 DIAGNOSIS — N186 End stage renal disease: Secondary | ICD-10-CM

## 2016-03-23 DIAGNOSIS — Z992 Dependence on renal dialysis: Principal | ICD-10-CM

## 2016-03-24 ENCOUNTER — Ambulatory Visit (INDEPENDENT_AMBULATORY_CARE_PROVIDER_SITE_OTHER): Payer: Medicare Other | Admitting: Vascular Surgery

## 2016-03-24 ENCOUNTER — Ambulatory Visit (INDEPENDENT_AMBULATORY_CARE_PROVIDER_SITE_OTHER): Payer: Medicare Other

## 2016-03-24 ENCOUNTER — Encounter (INDEPENDENT_AMBULATORY_CARE_PROVIDER_SITE_OTHER): Payer: Self-pay | Admitting: Vascular Surgery

## 2016-03-24 VITALS — BP 114/79 | HR 81 | Resp 16 | Ht 65.5 in | Wt 167.0 lb

## 2016-03-24 DIAGNOSIS — N186 End stage renal disease: Secondary | ICD-10-CM

## 2016-03-24 DIAGNOSIS — I10 Essential (primary) hypertension: Secondary | ICD-10-CM

## 2016-03-24 DIAGNOSIS — Y841 Kidney dialysis as the cause of abnormal reaction of the patient, or of later complication, without mention of misadventure at the time of the procedure: Secondary | ICD-10-CM | POA: Diagnosis not present

## 2016-03-24 DIAGNOSIS — Z951 Presence of aortocoronary bypass graft: Secondary | ICD-10-CM

## 2016-03-24 DIAGNOSIS — T82318A Breakdown (mechanical) of other vascular grafts, initial encounter: Secondary | ICD-10-CM

## 2016-03-24 DIAGNOSIS — Z992 Dependence on renal dialysis: Principal | ICD-10-CM

## 2016-03-24 DIAGNOSIS — I639 Cerebral infarction, unspecified: Secondary | ICD-10-CM

## 2016-03-24 IMAGING — CR DG CHEST 1V PORT
1 series · 1 of 1 positions shown · non-contrast
Comparison: 02/11/2014

CLINICAL DATA: Shortness of breath, fatigue, low blood pressure.
Dialysis patient with recent heart surgery in [REDACTED].

EXAM:
PORTABLE CHEST - 1 VIEW

[ap]
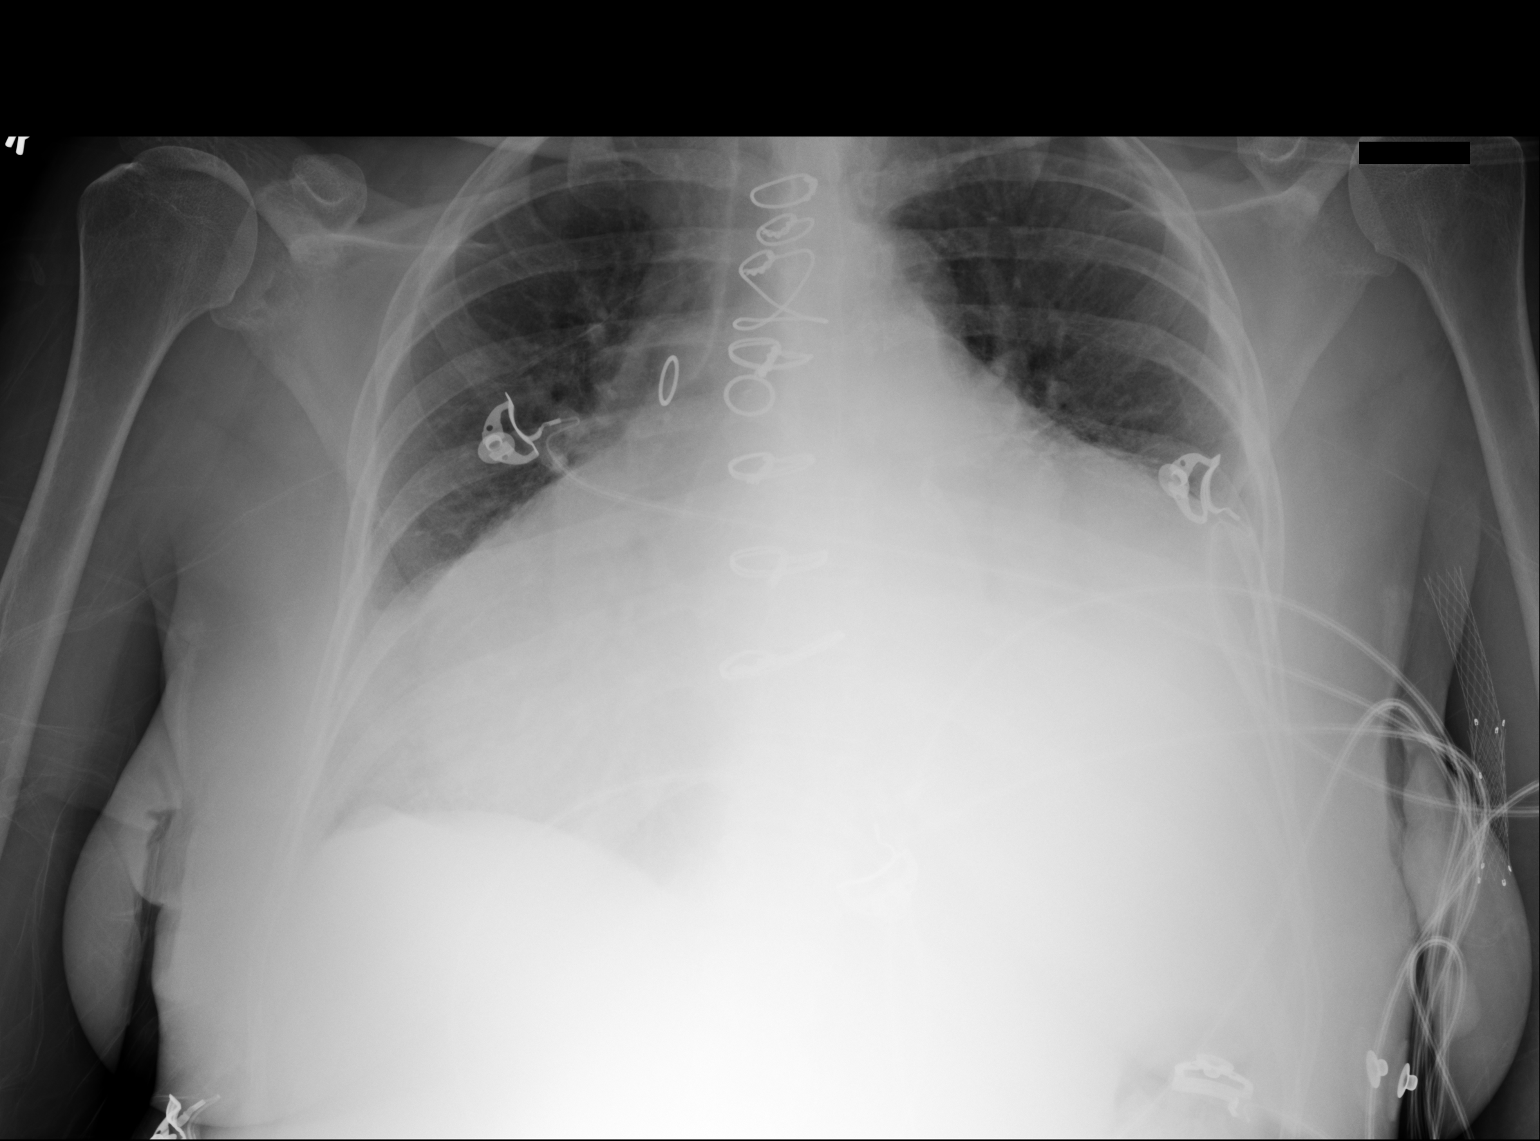

[1 of 1 positions shown; findings below may reference images not displayed]

FINDINGS: There is cardiomegaly, increasing since prior study. Prior CABG.
Cannot exclude a component of pericardial effusion. No overt
failure. No visible pleural effusions. Probable bilateral lower lobe
atelectasis, likely compressive atelectasis.
IMPRESSION: Enlarging size of the cardiopericardial silhouette. Cannot exclude a
component of pericardial effusion.

Bilateral lower lobe compressive atelectasis.

## 2016-03-24 NOTE — Assessment & Plan Note (Signed)
blood pressure control important in reducing the progression of atherosclerotic disease. On appropriate oral medications.  

## 2016-03-24 NOTE — Progress Notes (Signed)
MRN : [id number]  [name] is a 55 y.o. (05/24/1961) female who presents with chief complaint of  Chief Complaint  Patient presents with  . Follow-up    ultrasound  .  History of Present Illness: Patient returns in follow up for Her dialysis access. She has had some mild prolonged bleeding, but otherwise her left arm access has been working reasonably well. This is an Artegraft jump graft of a previous AV fistula. Her duplex today shows some mildly elevated velocities at the anastomosis of the jump graft to the perianastomotic fistula basilic vein. The access is otherwise patent.    Past Medical History:  Diagnosis Date  . Heart failure ([place])   . Hyperlipidemia   . Hypertension   . Myocardial infarction   . Renal failure   . Stroke [facility]) 2011    Past Surgical History:  Procedure Laterality Date  . CORONARY ANGIOPLASTY WITH STENT PLACEMENT  2013  . DIALYSIS FISTULA CREATION      Social History Social History  Substance Use Topics  . Smoking status: Current Every Day Smoker    Packs/day: 0.50    Years: 20.00    Types: Cigarettes  . Smokeless tobacco: Never Used  . Alcohol use No     Family History Family History  Problem Relation Age of Onset  . Hypertension    . Cancer    . Renal Disease     No bleeding or clotting disorders  Current Outpatient Prescriptions  Medication Sig Dispense Refill  . aspirin 81 MG tablet Take 81 mg by mouth daily.    Marland Kitchen ELIQUIS 5 MG TABS tablet     . hydrOXYzine (ATARAX/VISTARIL) 25 MG tablet Take 1 tablet by mouth 3 (three) times daily.    . hydrOXYzine (ATARAX/VISTARIL) 50 MG tablet Take 50 mg by mouth once.    . midodrine (PROAMATINE) 10 MG tablet Take 10 mg by mouth 1 day or 1 dose.    . ondansetron (ZOFRAN ODT) 4 MG disintegrating tablet Allow 1-2 tablets to dissolve in your mouth every 8 hours as needed for nausea/vomiting 30 tablet 0  . pantoprazole (PROTONIX) 40 MG tablet Take 1 tablet by mouth daily.    .  sennosides-docusate sodium (SENOKOT-S) 8.6-50 MG tablet Take 2 tablets by mouth daily.    . SENSIPAR 30 MG tablet Take 1 tablet by mouth daily.    . sevelamer carbonate (RENVELA) 800 MG tablet Take 1,600 mg by mouth 3 (three) times daily with meals.    Marland Kitchen amiodarone (PACERONE) 200 MG tablet Take 200 mg by mouth daily.    Marland Kitchen amitriptyline (ELAVIL) 25 MG tablet Take 25 mg by mouth at bedtime.    . docusate sodium (COLACE) 100 MG capsule Take 100 mg by mouth 2 (two) times daily.    Marland Kitchen HYDROcodone-acetaminophen (NORCO/VICODIN) 5-325 MG tablet Take 1 tablet by mouth every 4 (four) hours as needed for moderate pain. (Patient not taking: Reported on 03/24/2016) 6 tablet 0  . warfarin (COUMADIN) 2.5 MG tablet Take 2.5 mg by mouth daily.     No current facility-administered medications for this visit.     Allergies  Allergen Reactions  . Morphine And Related Hives  . Adhesive [Tape] Rash  . Latex Rash     REVIEW OF SYSTEMS (Negative unless checked)  Constitutional: '[]' Weight loss  '[]' Fever  '[]' Chills Cardiac: '[]' Chest pain   '[]' Chest pressure   '[x]' Palpitations   '[]' Shortness of breath when laying flat   '[]' Shortness of breath at  rest   '[]' Shortness of breath with exertion. Vascular:  '[]' Pain in legs with walking   '[]' Pain in legs at rest   '[]' Pain in legs when laying flat   '[]' Claudication   '[]' Pain in feet when walking  '[]' Pain in feet at rest  '[]' Pain in feet when laying flat   '[]' History of DVT   '[]' Phlebitis   '[]' Swelling in legs   '[]' Varicose veins   '[]' Non-healing ulcers Pulmonary:   '[]' Uses home oxygen   '[]' Productive cough   '[]' Hemoptysis   '[]' Wheeze  '[]' COPD    Neurologic:  '[]' Dizziness  '[]' Blackouts   '[]' Seizures   '[x]' History of stroke   '[]' History of TIA  '[]' Aphasia   '[]' Temporary blindness   '[]' Dysphagia   '[]' Weakness or numbness in arms   '[]' Weakness or numbness in legs Musculoskeletal:  '[]' Arthritis   '[]' Joint swelling   '[]' Joint pain   '[]' Low back pain Hematologic:  '[]' Easy bruising  '[]' Easy bleeding   '[]' Hypercoagulable  state   '[]' Anemic  '[]' Thrombocytopenia Gastrointestinal:  '[]' Blood in stool   '[]' Vomiting blood  '[]' Gastroesophageal reflux/heartburn   '[]' Difficulty swallowing. Genitourinary:  '[x]' Chronic kidney disease   '[]' Difficult urination  '[]' Frequent urination  '[]' Burning with urination   '[]' Blood in urine Skin:  '[]' Rashes   '[]' Ulcers   '[]' Wounds Psychological:  '[]' History of anxiety   '[]'  History of major depression.  Physical Examination  Vitals:   03/24/16 1131  BP: 114/79  Pulse: 81  Resp: 16  Weight: 167 lb (75.8 kg)  Height: 5' 5.5" (1.664 m)   Body mass index is 27.37 kg/m. Gen:  WD/WN, NAD, appears Older than stated age Head: [place]/AT, No temporalis wasting. Ear/Nose/Throat: Hearing grossly intact, trachea midline Eyes: PERRLA, EOMI. Sclera non-icteric Neck: Supple, no nuchal rigidity.  No JVD.  Pulmonary:  Good air movement, respirations not labored, no use of accessory muscles.  Cardiac: RRR, normal S1, S2. Vascular: Good thrill and bruit present in the left arm access Vessel Right Left  Radial Palpable Palpable                                   Gastrointestinal: soft, non-tender/non-distended. No guarding/reflex.  Musculoskeletal: M/S 5/5 throughout.  No deformity or atrophy.  Neurologic: CN 2-12 intact. Pain and light touch intact in extremities.  Symmetrical.  Speech is fluent. Psychiatric: Judgment intact, Mood & affect appropriate for pt's clinical situation. Dermatologic: No rashes or ulcers noted.  No cellulitis or open wounds. Lymph : No Cervical, Axillary, or Inguinal lymphadenopathy.     Labs Recent Results (from the past 2160 hour(s))  CBC     Status: Abnormal   Collection Time: 01/13/16 12:51 PM  Result Value Ref Range   WBC 6.4 3.6 - 11.0 K/uL   RBC 4.34 3.80 - 5.20 MIL/uL   Hemoglobin 12.4 12.0 - 16.0 g/dL   HCT 37.0 35.0 - 47.0 %   MCV 85.2 80.0 - 100.0 fL   MCH 28.7 26.0 - 34.0 pg   MCHC 33.6 32.0 - 36.0 g/dL   RDW 17.6 (H) 11.5 - 14.5 %   Platelets 151 150  - 440 K/uL  Comprehensive metabolic panel     Status: Abnormal   Collection Time: 01/13/16 12:51 PM  Result Value Ref Range   Sodium 135 135 - 145 mmol/L   Potassium 4.0 3.5 - 5.1 mmol/L   Chloride 97 (L) 101 - 111 mmol/L   CO2 23 22 - 32 mmol/L   Glucose, Bld 87  65 - 99 mg/dL   BUN 50 (H) 6 - 20 mg/dL   Creatinine, Ser 9.68 (H) 0.44 - 1.00 mg/dL   Calcium 7.8 (L) 8.9 - 10.3 mg/dL   Total Protein 8.5 (H) 6.5 - 8.1 g/dL   Albumin 3.6 3.5 - 5.0 g/dL   AST 79 (H) 15 - 41 U/L   ALT 84 (H) 14 - 54 U/L   Alkaline Phosphatase 189 (H) 38 - 126 U/L   Total Bilirubin 1.2 0.3 - 1.2 mg/dL   GFR calc non Af Amer 4 (L) >60 mL/min   GFR calc Af Amer 5 (L) >60 mL/min    Comment: (NOTE) The eGFR has been calculated using the CKD EPI equation. This calculation has not been validated in all clinical situations. eGFR's persistently <60 mL/min signify possible Chronic Kidney Disease.    Anion gap 15 5 - 15  Lipase, blood     Status: Abnormal   Collection Time: 01/13/16 12:51 PM  Result Value Ref Range   Lipase 59 (H) 11 - 51 U/L  Troponin I     Status: Abnormal   Collection Time: 01/13/16 12:51 PM  Result Value Ref Range   Troponin I 0.06 (HH) <0.03 ng/mL    Comment: CRITICAL RESULT CALLED TO, READ BACK BY AND VERIFIED WITH SHANNON [facility] RN AT 1325 01/13/16 MSS.     Radiology No results found.   Assessment/Plan  Stroke [facility]) Nothing recent.  Residual deficits stable  HTN (hypertension) blood pressure control important in reducing the progression of atherosclerotic disease. On appropriate oral medications.   Kidney dialysis as the cause of abnormal reaction of the patient, or of later complication, without mention of misadventure at the time of the procedure (CODE) Her duplex today shows some mildly elevated velocities at the anastomosis of the jump graft to the perianastomotic fistula basilic vein. The access is otherwise patent. No intervention currently required. Plan to recheck  in 6 months  ESRD on hemodialysis Long-standing renal failure. Has had access issues in the past. Her duplex today shows some mildly elevated velocities at the anastomosis of the jump graft to the perianastomotic fistula basilic vein. The access is otherwise patent.    Leotis Pain, MD  03/24/2016 11:48 AM    This note was created with Dragon medical transcription system.  Any errors from dictation are purely unintentional

## 2016-03-24 NOTE — Assessment & Plan Note (Signed)
Nothing recent.  Residual deficits stable

## 2016-03-24 NOTE — Assessment & Plan Note (Signed)
Her duplex today shows some mildly elevated velocities at the anastomosis of the jump graft to the perianastomotic fistula basilic vein. The access is otherwise patent. No intervention currently required. Plan to recheck in 6 months

## 2016-03-24 NOTE — Assessment & Plan Note (Signed)
Long-standing renal failure. Has had access issues in the past. Her duplex today shows some mildly elevated velocities at the anastomosis of the jump graft to the perianastomotic fistula basilic vein. The access is otherwise patent.

## 2016-03-25 IMAGING — CT CT ANGIO CHEST
2 of 6 series · 18 of 36 positions shown · IV contrast (APPLIED)
Comparison: Chest CT February 06, 2014 and chest radiograph Ram Unas

CLINICAL DATA: Chest pain and shortness of breath

EXAM:
CT ANGIOGRAPHY CHEST WITH CONTRAST
TECHNIQUE: Multidetector CT imaging of the chest was performed using the
standard protocol during bolus administration of intravenous
contrast. Multiplanar CT image reconstructions and MIPs were
obtained to evaluate the vascular anatomy.
CONTRAST:  75 mL Isovue 370 nonionic

[Series 5: pe 1.0 thins · axial · 0.68mm/px · z∈[+378,+618]mm · 17 of 270 slices shown]
[im 15/270  lung]
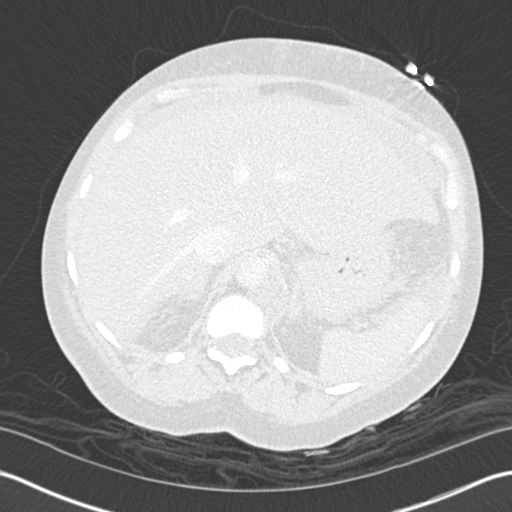
[im 30/270  mediastinal]
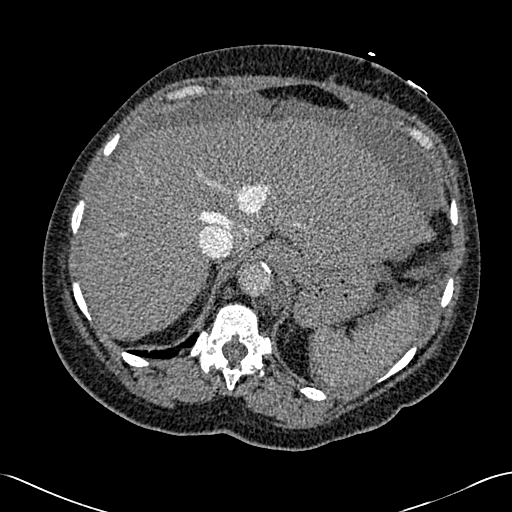
[im 45/270  lung]
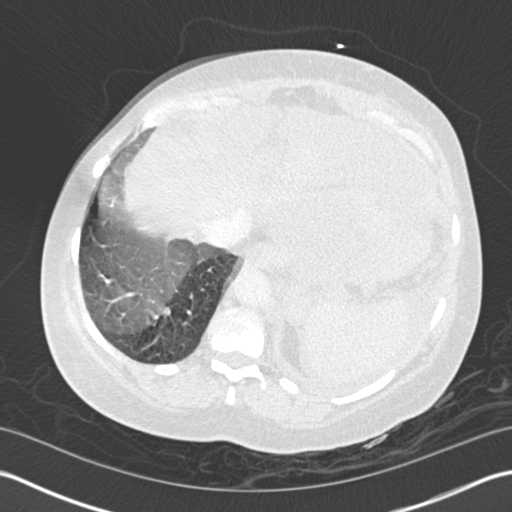
[im 60/270  mediastinal]
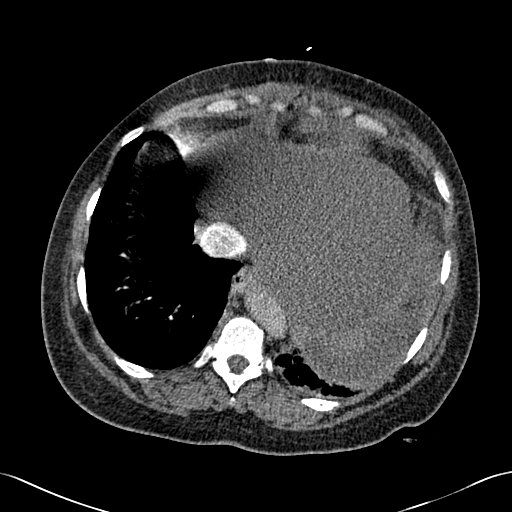
[im 75/270  lung]
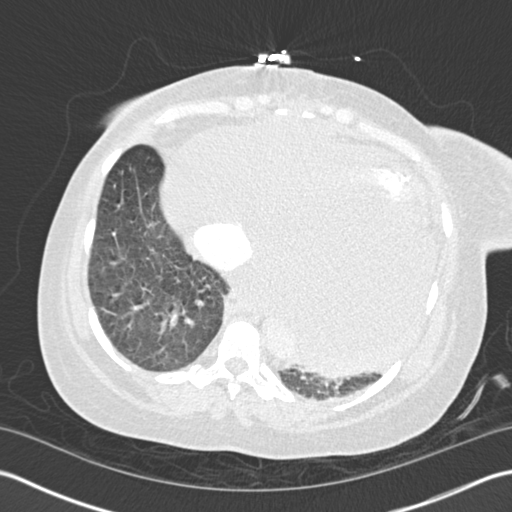
[im 90/270  mediastinal]
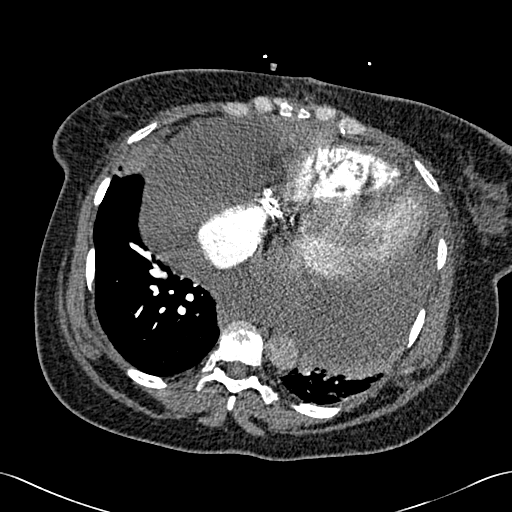
[im 105/270  lung]
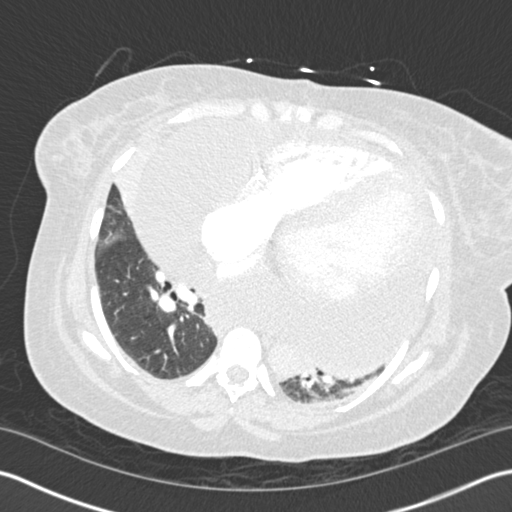
[im 120/270  mediastinal]
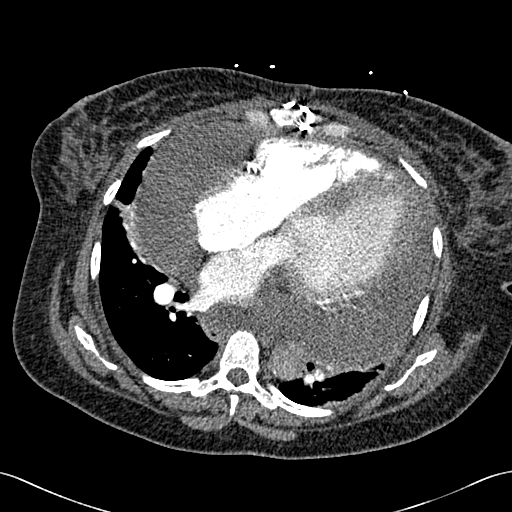
[im 135/270  lung]
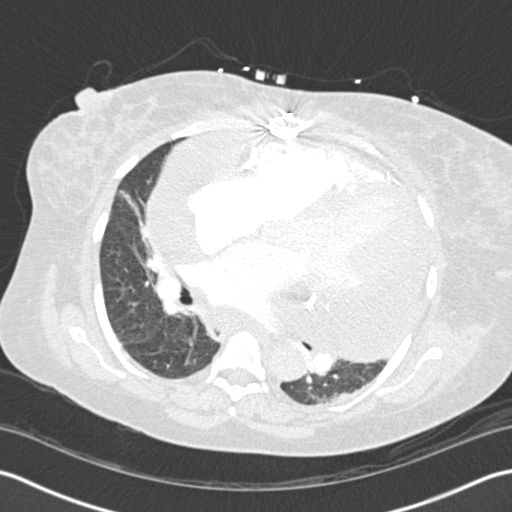
[im 150/270  mediastinal]
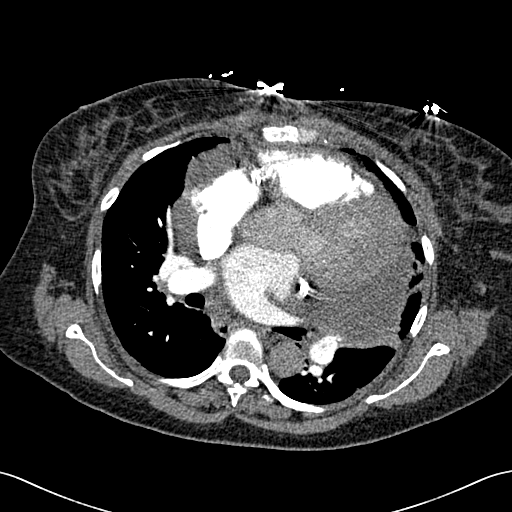
[im 165/270  lung]
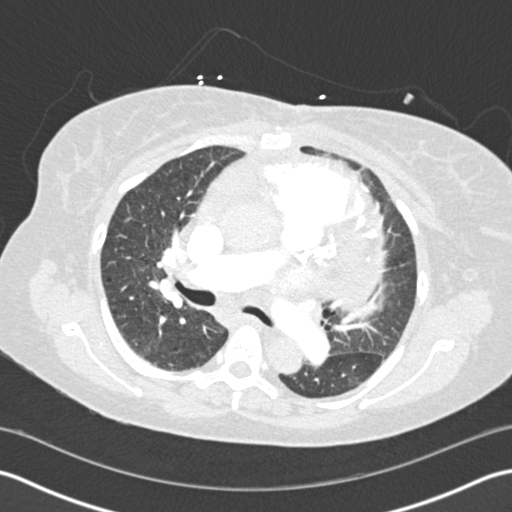
[im 180/270  mediastinal]
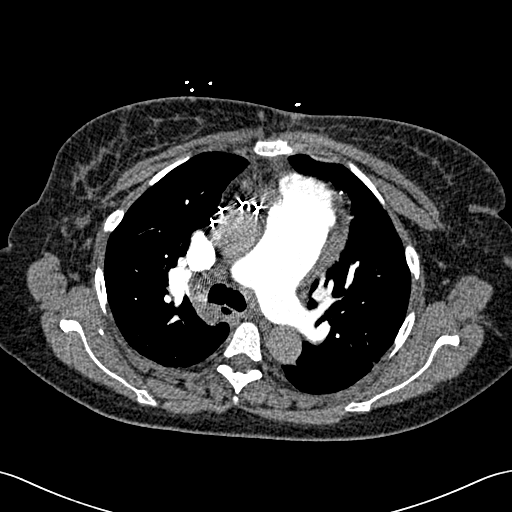
[im 195/270  lung]
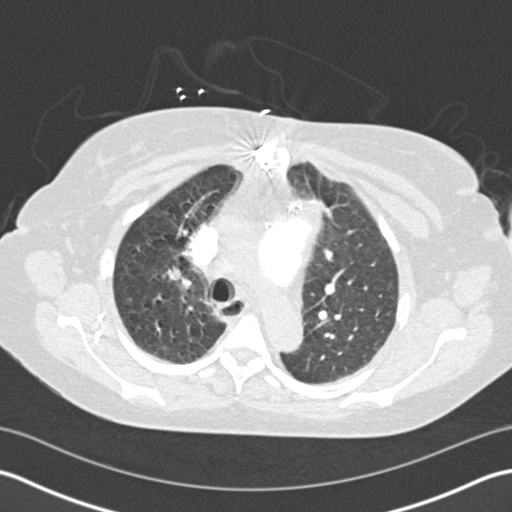
[im 210/270  mediastinal]
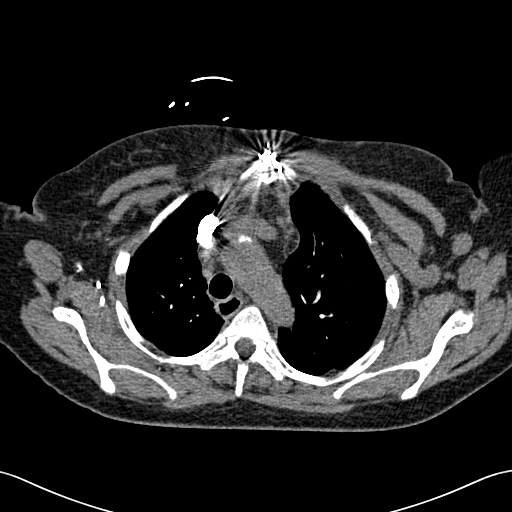
[im 225/270  lung]
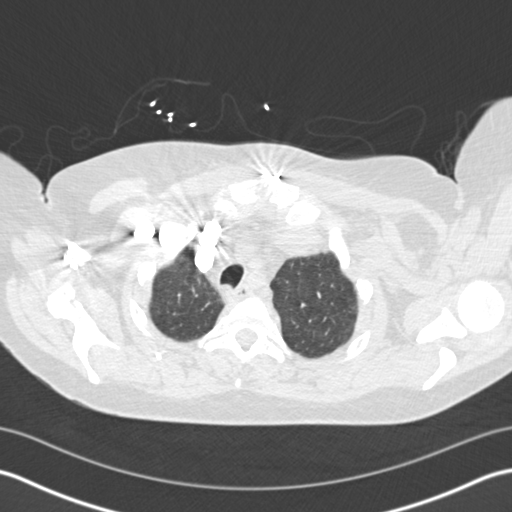
[im 240/270  mediastinal]
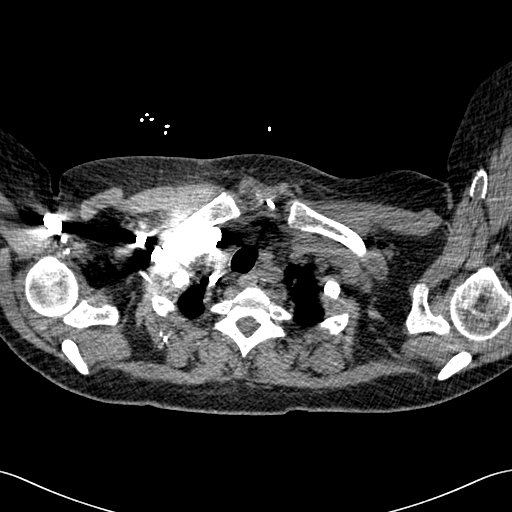
[im 255/270  lung]
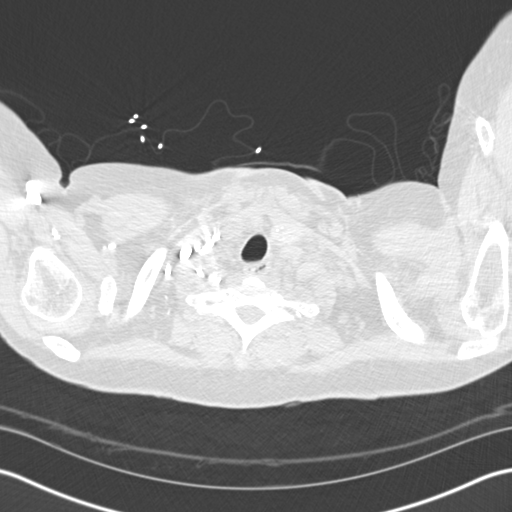

[Series 7: cor pe 2.0 mpr · coronal · 0.68mm/px · 1 of 125 slices shown]
[im 63/125  mediastinal]
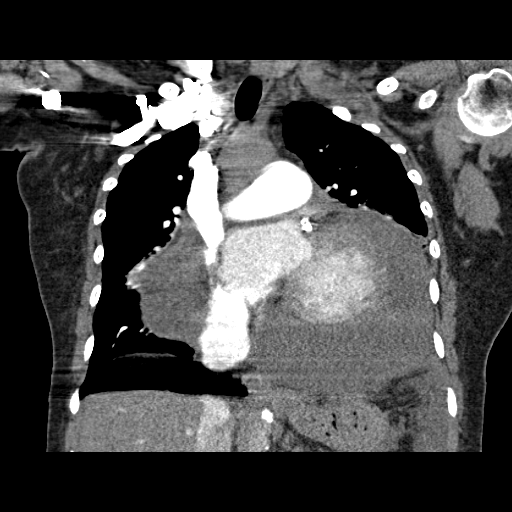

[18 of 36 positions shown; findings below may reference images not displayed]

FINDINGS: There is no demonstrable pulmonary embolus. There is no thoracic
aortic aneurysm.

There is a large pericardial effusion which appears to be causing
some compression of the heart given the change in cardiac size apart
from the effusion compared to the recent prior CT examination.

There is atelectatic change in the inferior lingula and left base
regions. There is also mild atelectatic change in the right base.
There is no appreciable interstitial edema or airspace
consolidation.

There is no appreciable thoracic adenopathy. Patient is status post
coronary artery bypass grafting.

In the visualized upper abdomen, there is hepatic steatosis. There
is atherosclerotic change in the abdominal aorta. There are no
blastic or lytic bone lesions. Thyroid is incompletely visualized
but diffusely enlarged in the visualized regions.

Review of the MIP images confirms the above findings.
IMPRESSION: No demonstrable pulmonary embolus.

Large pericardial effusion which may be causing a degree of cardiac
tamponade.

Atelectatic change in the lung bases, most severe in the inferior
lingula.

No overt congestive heart failure.

Hepatic steatosis.

Critical Value/emergent results were called by telephone at the time
of interpretation on 04/11/2014 at [DATE] to Dr. GURWINDER OR ,
who verbally acknowledged these results.

## 2016-03-25 IMAGING — CR DG CHEST 1V PORT
1 series · 1 of 1 positions shown · non-contrast
Comparison: CT chest 04/11/2014, chest x-ray 04/10/2014

CLINICAL DATA: Central line placement

EXAM:
PORTABLE CHEST - 1 VIEW

[ap]
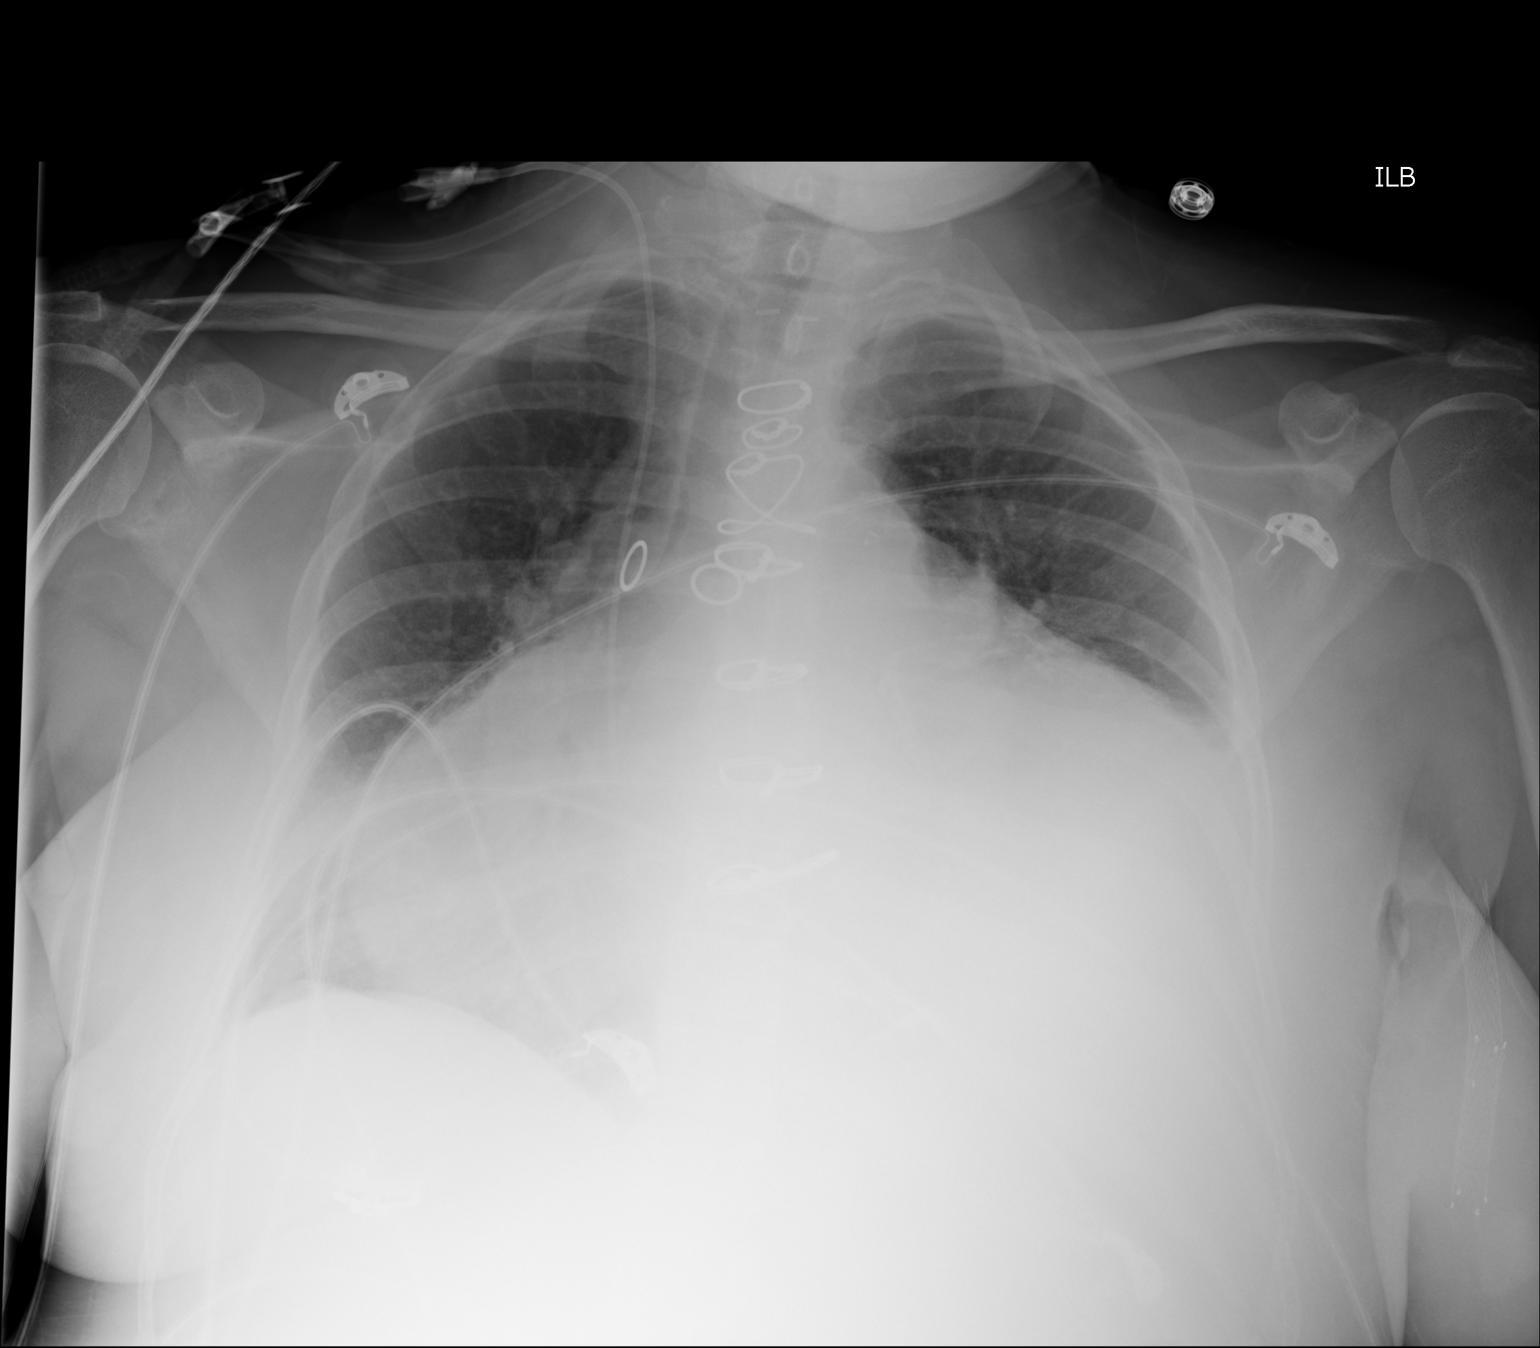

[1 of 1 positions shown; findings below may reference images not displayed]

FINDINGS: Right-sided jugular central venous catheter with the tip projecting
over the SVC. No focal consolidation, pleural effusion or
pneumothorax. Massive enlargement of the cardiac silhouette
consistent with a large pericardial effusion. Prior CABG.
Unremarkable osseous structures.
IMPRESSION: 1. Right-sided central venous catheter with the tip projecting over
the SVC.
2. Enlarged cardiac silhouette consistent with a large pericardial
effusion.

## 2016-04-12 ENCOUNTER — Emergency Department: Payer: Medicare Other

## 2016-04-12 ENCOUNTER — Emergency Department
Admission: EM | Admit: 2016-04-12 | Discharge: 2016-04-12 | Disposition: A | Discharge status: Home / Self Care | Payer: Medicare Other | Attending: Emergency Medicine | Admitting: Emergency Medicine

## 2016-04-12 ENCOUNTER — Encounter: Payer: Self-pay | Admitting: Emergency Medicine

## 2016-04-12 DIAGNOSIS — Z91040 Latex allergy status: Secondary | ICD-10-CM | POA: Insufficient documentation

## 2016-04-12 DIAGNOSIS — N186 End stage renal disease: Secondary | ICD-10-CM | POA: Diagnosis not present

## 2016-04-12 DIAGNOSIS — R0789 Other chest pain: Secondary | ICD-10-CM | POA: Diagnosis not present

## 2016-04-12 DIAGNOSIS — M545 Low back pain, unspecified: Secondary | ICD-10-CM

## 2016-04-12 DIAGNOSIS — F17210 Nicotine dependence, cigarettes, uncomplicated: Secondary | ICD-10-CM | POA: Diagnosis not present

## 2016-04-12 DIAGNOSIS — I509 Heart failure, unspecified: Secondary | ICD-10-CM | POA: Diagnosis not present

## 2016-04-12 DIAGNOSIS — K8020 Calculus of gallbladder without cholecystitis without obstruction: Secondary | ICD-10-CM

## 2016-04-12 DIAGNOSIS — Z7982 Long term (current) use of aspirin: Secondary | ICD-10-CM | POA: Insufficient documentation

## 2016-04-12 DIAGNOSIS — I2510 Atherosclerotic heart disease of native coronary artery without angina pectoris: Secondary | ICD-10-CM | POA: Insufficient documentation

## 2016-04-12 DIAGNOSIS — I132 Hypertensive heart and chronic kidney disease with heart failure and with stage 5 chronic kidney disease, or end stage renal disease: Secondary | ICD-10-CM | POA: Diagnosis not present

## 2016-04-12 DIAGNOSIS — Z992 Dependence on renal dialysis: Secondary | ICD-10-CM | POA: Diagnosis not present

## 2016-04-12 DIAGNOSIS — M7918 Myalgia, other site: Secondary | ICD-10-CM

## 2016-04-12 DIAGNOSIS — D259 Leiomyoma of uterus, unspecified: Secondary | ICD-10-CM | POA: Diagnosis not present

## 2016-04-12 DIAGNOSIS — R1012 Left upper quadrant pain: Secondary | ICD-10-CM | POA: Diagnosis present

## 2016-04-12 LAB — BASIC METABOLIC PANEL
Anion gap: 13 (ref 5–15)
BUN: 47 mg/dL — AB (ref 6–20)
CALCIUM: 9.1 mg/dL (ref 8.9–10.3)
CO2: 26 mmol/L (ref 22–32)
CREATININE: 8.81 mg/dL — AB (ref 0.44–1.00)
Chloride: 99 mmol/L — ABNORMAL LOW (ref 101–111)
GFR calc non Af Amer: 5 mL/min — ABNORMAL LOW (ref >60)
GFR, EST AFRICAN AMERICAN: 5 mL/min — AB (ref >60)
Glucose, Bld: 107 mg/dL — ABNORMAL HIGH (ref 65–99)
Potassium: 4.1 mmol/L (ref 3.5–5.1)
Sodium: 138 mmol/L (ref 135–145)

## 2016-04-12 LAB — CBC
HCT: 41.5 % (ref 35.0–47.0)
Hemoglobin: 13.9 g/dL (ref 12.0–16.0)
MCH: 29.5 pg (ref 26.0–34.0)
MCHC: 33.4 g/dL (ref 32.0–36.0)
MCV: 88.4 fL (ref 80.0–100.0)
PLATELETS: 144 10*3/uL — AB (ref 150–440)
RBC: 4.7 MIL/uL (ref 3.80–5.20)
RDW: 15.1 % — AB (ref 11.5–14.5)
WBC: 4.7 10*3/uL (ref 3.6–11.0)

## 2016-04-12 LAB — TROPONIN I
TROPONIN I: 0.06 ng/mL — AB (ref <0.03)
Troponin I: 0.05 ng/mL (ref <0.03)

## 2016-04-12 LAB — LIPASE, BLOOD: Lipase: 81 U/L — ABNORMAL HIGH (ref 11–51)

## 2016-04-12 MED ORDER — CYCLOBENZAPRINE HCL 10 MG PO TABS: 10.0000 mg | ORAL_TABLET | Freq: Three times a day (TID) | ORAL | 0 refills | Status: DC | PRN

## 2016-04-12 MED ORDER — ONDANSETRON HCL 4 MG/2ML IJ SOLN
4.0000 mg | Freq: Once | INTRAMUSCULAR | Status: AC
  Administered 2016-04-12: 4 mg via INTRAVENOUS
  Filled 2016-04-12: qty 2

## 2016-04-12 MED ORDER — HYDROCODONE-ACETAMINOPHEN 5-325 MG PO TABS
ORAL_TABLET | ORAL | Status: AC
  Administered 2016-04-12: 1 via ORAL
  Filled 2016-04-12: qty 1

## 2016-04-12 MED ORDER — IBUPROFEN 600 MG PO TABS: 600.0000 mg | ORAL_TABLET | Freq: Three times a day (TID) | ORAL | 0 refills | Status: DC | PRN

## 2016-04-12 MED ORDER — FENTANYL CITRATE (PF) 100 MCG/2ML IJ SOLN
50.0000 ug | Freq: Once | INTRAMUSCULAR | Status: AC
  Administered 2016-04-12: 50 ug via INTRAVENOUS
  Filled 2016-04-12: qty 2

## 2016-04-12 MED ORDER — HYDROCODONE-ACETAMINOPHEN 5-325 MG PO TABS
1.0000 | ORAL_TABLET | Freq: Once | ORAL | Status: AC
  Administered 2016-04-12: 1 via ORAL

## 2016-04-12 NOTE — ED Provider Notes (Signed)
[facility]  I accepted care from Dr. Owens Shark ____________________________________________    LABS (pertinent positives/negatives)  Labs Reviewed  BASIC METABOLIC PANEL - Abnormal; Notable for the following:       Result Value   Chloride 99 (*)    Glucose, Bld 107 (*)    BUN 47 (*)    Creatinine, Ser 8.81 (*)    GFR calc non Af Amer 5 (*)    GFR calc Af Amer 5 (*)    All other components within normal limits  CBC - Abnormal; Notable for the following:    RDW 15.1 (*)    Platelets 144 (*)    All other components within normal limits  TROPONIN I - Abnormal; Notable for the following:    Troponin I 0.06 (*)    All other components within normal limits  LIPASE, BLOOD - Abnormal; Notable for the following:    Lipase 81 (*)    All other components within normal limits  TROPONIN I - Abnormal; Notable for the following:    Troponin I 0.05 (*)    All other components within normal limits    ____________________________________________   PROCEDURES  Procedure(s) performed: None  Critical Care performed: None  ____________________________________________   INITIAL IMPRESSION / ASSESSMENT AND PLAN / ED COURSE   Pertinent labs & imaging results that were available during my care of the patient were reviewed by me and considered in my medical decision making (see chart for details).  Signed out awaiting repeat troponin, although symptoms very atypical for cardiac pain.  I discussed the reassuring troponin, down from 0.06 which is also consistent with prior chronically minimally elevated troponin with the patient.  She is still reporting pain and points to left side anteriorly below the breast but along the rib margin. There is no skin rash such as shingles. I pushed around in her abdomen and she states this does not seem like something originating from the GI system or the abdomen. She's not reporting trouble breathing or hypoxia. She's not reporting  fevers. She did state that she threw up a little bit, but there is no blood there and she does not think that the pains that she is here for are due to gastrointestinal. We discussed the possibility of inflammation based pain, bone versus cartilage versus muscle versus nerve. Ultimately I discussed with her that I am not comfortable prescribing narcotic medication for this. She is going to take 1 Vicodin tablet here before she goes. She is a dialysis patient, and stated that her kidneys are "already gone "and I think it's reasonable for an acute course of anti-inflammatory medications to help with undifferentiated discomfort that seems to me likely inflammation based.    CONSULTATIONS: None  Patient / Family / Caregiver informed of clinical course, medical decision-making process, and agree with plan.   I discussed return precautions, follow-up instructions, and discharged instructions with patient and/or family.     ____________________________________________   FINAL CLINICAL IMPRESSION(S) / ED DIAGNOSES  Final diagnoses:  Acute left-sided low back pain without sciatica  Gallstones  Uterine leiomyoma, unspecified location  Atypical chest pain  Musculoskeletal pain        Lisa Roca, MD 04/12/16 [phone]

## 2016-04-12 NOTE — ED Notes (Signed)
Pt's requesting RN to speak with pt's mother on phone. Mother requesting pt have [place] of her gallbladder because that was a problem for pt's mother and sister. Request relayed to EDP.

## 2016-04-12 NOTE — ED Notes (Signed)
Pt verbalized understanding of discharge instructions. NAD at this time. 

## 2016-04-12 NOTE — Discharge Instructions (Addendum)
From Dr. Reita Cliche: Return to the emergency department for any fever, worsening chest pain or any abdominal pain, black or bloody stools, vomiting blood, trouble breathing, weakness or numbness or passing out, or any other symptoms concerning to you.  As we discussed, your symptoms seem most likely to be due to some sort of musculoskeletal or or nerve inflammation and I am recommending muscle relaxer and anti-inflammatory medications. We discussed I'm not going to prescribe narcotic, if you have persistent pain, you may see your primary care doctor for follow-up.  Imaging showed some findings that I think are unrelated, but nevertheless important for you to understand, you do have gallstones and uterine fibroids. Follow-up with your primary care doctor.

## 2016-04-12 NOTE — ED Triage Notes (Signed)
Pt to rm 16 via EMS from home, report pain under left breast, esp w/ palpation.  NSR per EMS w/ pvc.  VSS.  Pt dialysis pt, fistula left arm, receives tx MWF.  PT NAD at this time.

## 2016-04-12 NOTE — ED Provider Notes (Signed)
[facility] Emergency Department Provider Note   First MD Initiated Contact with Patient 04/12/16 [phone]     (approximate)  I have reviewed the triage vital signs and the nursing notes.   HISTORY  Chief Complaint Chest Pain    HPI [name] is a 55 y.o. female presents with 10 out of 10 left upper quadrant abdominal pain via EMS. Patient denies any nausea no vomiting diarrhea constipation. Patient does state that pain is worse with deep inspiration. Patient denies any fever no cough   Past Medical History:  Diagnosis Date  . Heart failure ([place])   . Hyperlipidemia   . Hypertension   . Myocardial infarction   . Renal failure   . Stroke [facility]) 2011    Patient Active Problem List   Diagnosis Date Noted  . Kidney dialysis as the cause of abnormal reaction of the patient, or of later complication, without mention of misadventure at the time of the procedure (CODE) 03/24/2016  . Hyperkalemia 11/13/2014  . Generalized weakness 10/15/2014  . Chest pain 10/15/2014  . ESRD on hemodialysis ([place]) 10/15/2014  . Congestive heart failure (CHF) ([place]) 10/15/2014  . HTN (hypertension) 10/15/2014  . Sternal wound dehiscence 04/12/2014  . 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 Pericardial effusion 04/12/2014  . History of delirium 04/12/2014  . H/O atrial flutter 04/12/2014  . Delayed surgical wound healing 04/12/2014  . Chest wall pain following surgery 04/12/2014  . S/P CABG (coronary artery bypass graft) 04/02/2014  . Lactic acidosis 03/26/2014  . Arteriosclerotic dementia 03/25/2014  . Atrial flutter by electrocardiogram ([place]) 03/22/2014  . Superficial incisional surgical site infection 03/14/2014  . Postoperative anemia due to acute blood loss 02/27/2014  . Obesity 02/27/2014  . H/O: substance abuse 02/27/2014  . Anemia of chronic renal failure 02/27/2014  . Acute postoperative pain 02/27/2014  . Leukocytosis 02/26/2014  . Hypotension  02/24/2014  . Exhausted vascular access 02/24/2014  . Anemia of chronic disease 02/24/2014  . Stroke ([place]) 02/21/2014  . S/P CABG x 3 02/21/2014  . Dialysis patient ([place]) 02/21/2014  . Hyperlipidemia 02/13/2014  . History of hepatitis C 02/13/2014  . HFrEF (heart failure with reduced ejection fraction) ([place]) 02/13/2014  . H/O stroke without residual deficits 02/13/2014  . ESRD (end stage renal disease) on dialysis ([place]) 02/13/2014  . End stage renal disease with hypertension ([place]) 02/13/2014  . CAD, multiple vessel 02/13/2014  . Retrosternal chest pain 05/30/2013  . Gallstones 05/29/2013    Past Surgical History:  Procedure Laterality Date  . CORONARY ANGIOPLASTY WITH STENT PLACEMENT  2013  . DIALYSIS FISTULA CREATION      Prior to Admission medications   Medication Sig Start Date End Date Taking? Authorizing Provider  aspirin 81 MG tablet Take 81 mg by mouth daily.   Yes Historical Provider, MD  ELIQUIS 5 MG TABS tablet Take 5 mg by mouth 2 (two) times daily.  02/25/16  Yes Historical Provider, MD  hydrOXYzine (ATARAX/VISTARIL) 50 MG tablet Take 50 mg by mouth once.   Yes Historical Provider, MD  pantoprazole (PROTONIX) 40 MG tablet Take 1 tablet by mouth daily. 05/07/13  Yes Historical Provider, MD  SENSIPAR 30 MG tablet Take 1 tablet by mouth daily. 05/10/13  Yes Historical Provider, MD  sevelamer carbonate (RENVELA) 800 MG tablet Take 2,400 mg by mouth 3 (three) times daily with meals.    Yes Historical Provider, MD  amiodarone (PACERONE) 200 MG tablet Take 200 mg by mouth daily.  Historical Provider, MD  amitriptyline (ELAVIL) 25 MG tablet Take 25 mg by mouth at bedtime.    Historical Provider, MD  docusate sodium (COLACE) 100 MG capsule Take 100 mg by mouth 2 (two) times daily.    Historical Provider, MD  HYDROcodone-acetaminophen (NORCO/VICODIN) 5-325 MG tablet Take 1 tablet by mouth every 4 (four) hours as needed for moderate pain. Patient not taking: Reported on  03/24/2016 07/18/15   Harvest Dark, MD  hydrOXYzine (ATARAX/VISTARIL) 25 MG tablet Take 1 tablet by mouth 2 (two) times daily as needed.  05/18/13   Historical Provider, MD  midodrine (PROAMATINE) 10 MG tablet Take 10 mg by mouth 1 day or 1 dose.    Historical Provider, MD  ondansetron (ZOFRAN ODT) 4 MG disintegrating tablet Allow 1-2 tablets to dissolve in your mouth every 8 hours as needed for nausea/vomiting 01/13/16   Hinda Kehr, MD  sennosides-docusate sodium (SENOKOT-S) 8.6-50 MG tablet Take 2 tablets by mouth daily.    Historical Provider, MD  warfarin (COUMADIN) 2.5 MG tablet Take 2.5 mg by mouth daily.    Historical Provider, MD    Allergies Morphine and related; Adhesive [tape]; and Latex  Family History  Problem Relation Age of Onset  . Hypertension    . Cancer    . Renal Disease      Social History Social History  Substance Use Topics  . Smoking status: Current Every Day Smoker    Packs/day: 0.50    Years: 20.00    Types: Cigarettes  . Smokeless tobacco: Never Used  . Alcohol use No    Review of Systems Constitutional: No fever/chills Eyes: No visual changes. ENT: No sore throat. Cardiovascular: Denies chest pain. Respiratory: Denies shortness of breath. Gastrointestinal:Positive for abdominal pain  No nausea, no vomiting.  No diarrhea.  No constipation. Genitourinary: Negative for dysuria. Musculoskeletal: Negative for back pain. Skin: Negative for rash. Neurological: Negative for headaches, focal weakness or numbness.  10-point ROS otherwise negative.  ____________________________________________   PHYSICAL EXAM:  VITAL SIGNS: ED Triage Vitals  Enc Vitals Group     BP 04/12/16 0429 (!) 125/95     Pulse Rate 04/12/16 0429 87     Resp 04/12/16 0429 20     Temp 04/12/16 0429 97.8 F (36.6 C)     Temp Source 04/12/16 0429 Oral     SpO2 04/12/16 0429 95 %     Weight 04/12/16 0431 167 lb (75.8 kg)     Height 04/12/16 0431 5\' 6"  (1.676 m)     Head  Circumference --      Peak Flow --      Pain Score 04/12/16 0432 10     Pain Loc --      Pain Edu? --      Excl. in [place]? --    Constitutional: Alert and oriented. Well appearing and in no acute distress. Eyes: Conjunctivae are normal. PERRL. EOMI. Head: Atraumatic. Mouth/Throat: Mucous membranes are moist.  Oropharynx non-erythematous. Cardiovascular: Normal rate, regular rhythm. Good peripheral circulation. Grossly normal heart sounds. Respiratory: Normal respiratory effort.  No retractions. Lungs CTAB. Gastrointestinal: Left upper quadrant abdominal pain with palpation No distention.   Musculoskeletal: No lower extremity tenderness nor edema. No gross deformities of extremities. Neurologic:  Normal speech and language. No gross focal neurologic deficits are appreciated.  Skin:  Skin is warm, dry and intact. No rash noted. Psychiatric: Mood and affect are normal. Speech and behavior are normal.  ____________________________________________   LABS (all labs ordered are  listed, but only abnormal results are displayed)  Labs Reviewed  BASIC METABOLIC PANEL - Abnormal; Notable for the following:       Result Value   Chloride 99 (*)    Glucose, Bld 107 (*)    BUN 47 (*)    Creatinine, Ser 8.81 (*)    GFR calc non Af Amer 5 (*)    GFR calc Af Amer 5 (*)    All other components within normal limits  CBC - Abnormal; Notable for the following:    RDW 15.1 (*)    Platelets 144 (*)    All other components within normal limits  TROPONIN I - Abnormal; Notable for the following:    Troponin I 0.06 (*)    All other components within normal limits  LIPASE, BLOOD - Abnormal; Notable for the following:    Lipase 81 (*)    All other components within normal limits   ____________________________________________  EKG  ED ECG REPORT I, [place] N Issak Goley, the attending physician, personally viewed and interpreted this ECG.   Date: 04/12/2016  EKG Time: 4:42 AM  Rate: 89  Rhythm: Normal  sinus rhythm  Axis: Normal  Intervals: Normal  ST&T Change: None  ____________________________________________  RADIOLOGY I, [place] N Tamey Wanek, personally viewed and evaluated these images (plain radiographs) as part of my medical decision making, as well as reviewing the written report by the radiologist.  Dg Chest Port 1 View  Result Date: 04/12/2016 CLINICAL DATA:  Back pain pain under the left breast EXAM: PORTABLE CHEST 1 VIEW COMPARISON:  01/13/2016 FINDINGS: Median sternotomy wires and surgical clips are evident. Moderate-to-marked globular cardiomegaly. No overt failure. No effusion. No pneumothorax. IMPRESSION: Moderate to marked cardiomegaly. Cannot exclude mild atelectasis or infiltrate in the left base. Electronically Signed   By: Donavan Foil M.D.   On: 04/12/2016 05:11   Ct Renal Stone Study  Result Date: 04/12/2016 CLINICAL DATA:  Initial evaluation for acute severe left upper quadrant/left flank pain. History of dialysis. EXAM: CT ABDOMEN AND PELVIS WITHOUT CONTRAST TECHNIQUE: Multidetector CT imaging of the abdomen and pelvis was performed following the standard protocol without IV contrast. COMPARISON:  None. FINDINGS: Lower chest: Scattered atelectatic changes present within the visualized lung bases, greater on the left. Moderate to severe cardiomegaly partially visualized. Prominent coronary artery calcifications. No pleural or pericardial effusion. Hepatobiliary: The liver demonstrates a somewhat nodular contour, suggestive of cirrhosis. No focal intrahepatic lesions identified on this noncontrast examination. Gallbladder is contracted with a few stones present. No free pericholecystic fluid. No biliary dilatation. Pancreas: Pancreas within normal limits. Spleen: Spleen within normal limits. Adrenals/Urinary Tract: Adrenal glands within normal limits. Kidneys small and atrophic bilaterally. 15 mm cyst present within the left kidney. Few additional scattered sub cm  hypodensities within the bilateral kidneys too small the characterize, but statistically likely reflects cysts as well. No nephrolithiasis or hydronephrosis. No radiopaque calculi seen along the course of either ureter. No hydroureter. Bladder largely decompressed without obvious abnormality. Stomach/Bowel: Stomach within normal limits. No evidence for bowel obstruction. Appendix within normal limits. No acute inflammatory changes about the bowels. Vascular/Lymphatic: Extensive atheromatous plaque throughout the intra-abdominal aorta and its branch vessels. No aneurysm. No pathologically enlarged intra-abdominal or pelvic lymph nodes. Reproductive: 2 adjacent calcified lesions arising from the uterus compatible with fibroids. These measure approximately 2 cm each. Uterus otherwise unremarkable. Ovaries within normal limits. Other: No free intraperitoneal air. Small volume free fluid within the abdomen and pelvis. Hazy omental edema, suspected to be  related to overall volume status. Small paraumbilical fat containing hernia. Musculoskeletal: Visualized osseous structures diffusely sclerotic, likely related underlying renal osteodystrophy. No acute osseous abnormality. No worrisome lytic or blastic osseous lesions. Facet arthropathy noted within the lower lumbar spine. IMPRESSION: 1. No CT evidence for nephrolithiasis or obstructive uropathy. 2. Atrophic kidneys bilaterally, compatible with end-stage renal disease. 3. Nodular contour of the liver, suggestive of possible cirrhosis. Correlation with LFTs recommended. 4. Small volume ascites within the abdomen and pelvis. 5. Cholelithiasis. 6. Fibroid uterus. 7. Severe cardiomegaly with advanced coronary artery calcifications and atherosclerotic disease throughout the intra-abdominal aorta and its branch vessels. Electronically Signed   By: Jeannine Boga M.D.   On: 04/12/2016 06:06    Procedures     INITIAL IMPRESSION / ASSESSMENT AND PLAN / ED  COURSE  Pertinent labs & imaging results that were available during my care of the patient were reviewed by me and considered in my medical decision making (see chart for details).     Clinical Course    ____________________________________________  FINAL CLINICAL IMPRESSION(S) / ED DIAGNOSES  Final diagnoses:  Acute left-sided low back pain without sciatica  Gallstones  Uterine leiomyoma, unspecified location  Atypical chest pain  Musculoskeletal pain     MEDICATIONS GIVEN DURING THIS VISIT:  Medications  fentaNYL (SUBLIMAZE) injection 50 mcg (50 mcg Intravenous Given 04/12/16 0447)  ondansetron (ZOFRAN) injection 4 mg (4 mg Intravenous Given 04/12/16 0447)     NEW OUTPATIENT MEDICATIONS STARTED DURING THIS VISIT:  New Prescriptions   No medications on file    Modified Medications   No medications on file    Discontinued Medications   No medications on file     Note:  This document was prepared using Dragon voice recognition software and may include unintentional dictation errors.    Gregor Hams, MD 04/13/16 [phone]

## 2016-04-12 NOTE — ED Notes (Signed)
Discharge delayed due to RN making multiple attempts to determine if the Pts brother was on the way to pick her up. Pt now states he is on the way. RN also spoke to Pt's mother on the Pt's cell phone due to the mother stating she feels as if we have not done enough to determine what is going on with the pt.

## 2016-04-24 IMAGING — CR DG CHEST 2V
1 series · 2 of 2 positions shown · non-contrast
Comparison: 04/11/2014.

CLINICAL DATA: Shortness of breath since CABG.

EXAM:
CHEST  2 VIEW

[Series 1: dxr chest pa (or ap) and lateral · 0.14mm/px · 2 of 2 slices shown]
[im 1/2]
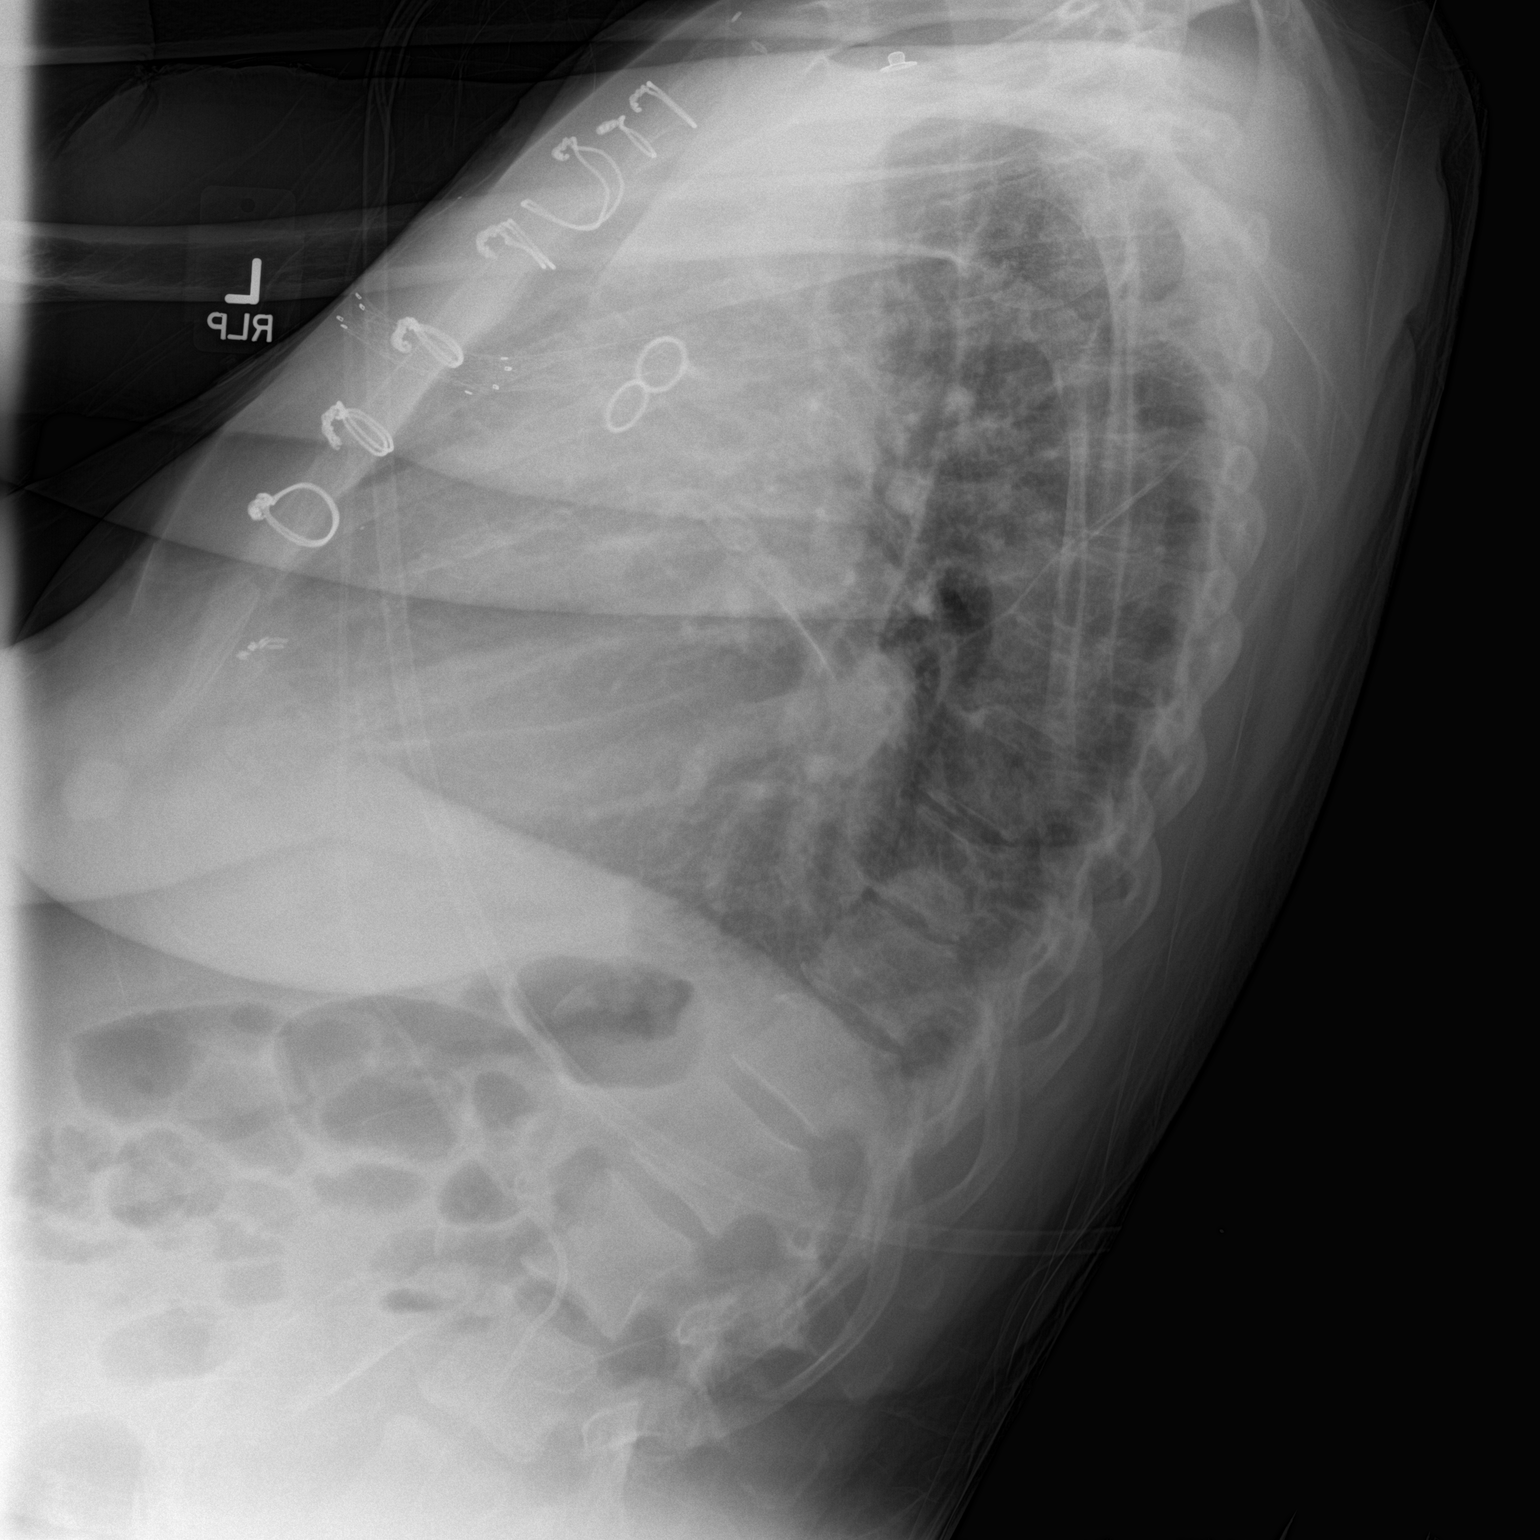
[im 2/2]
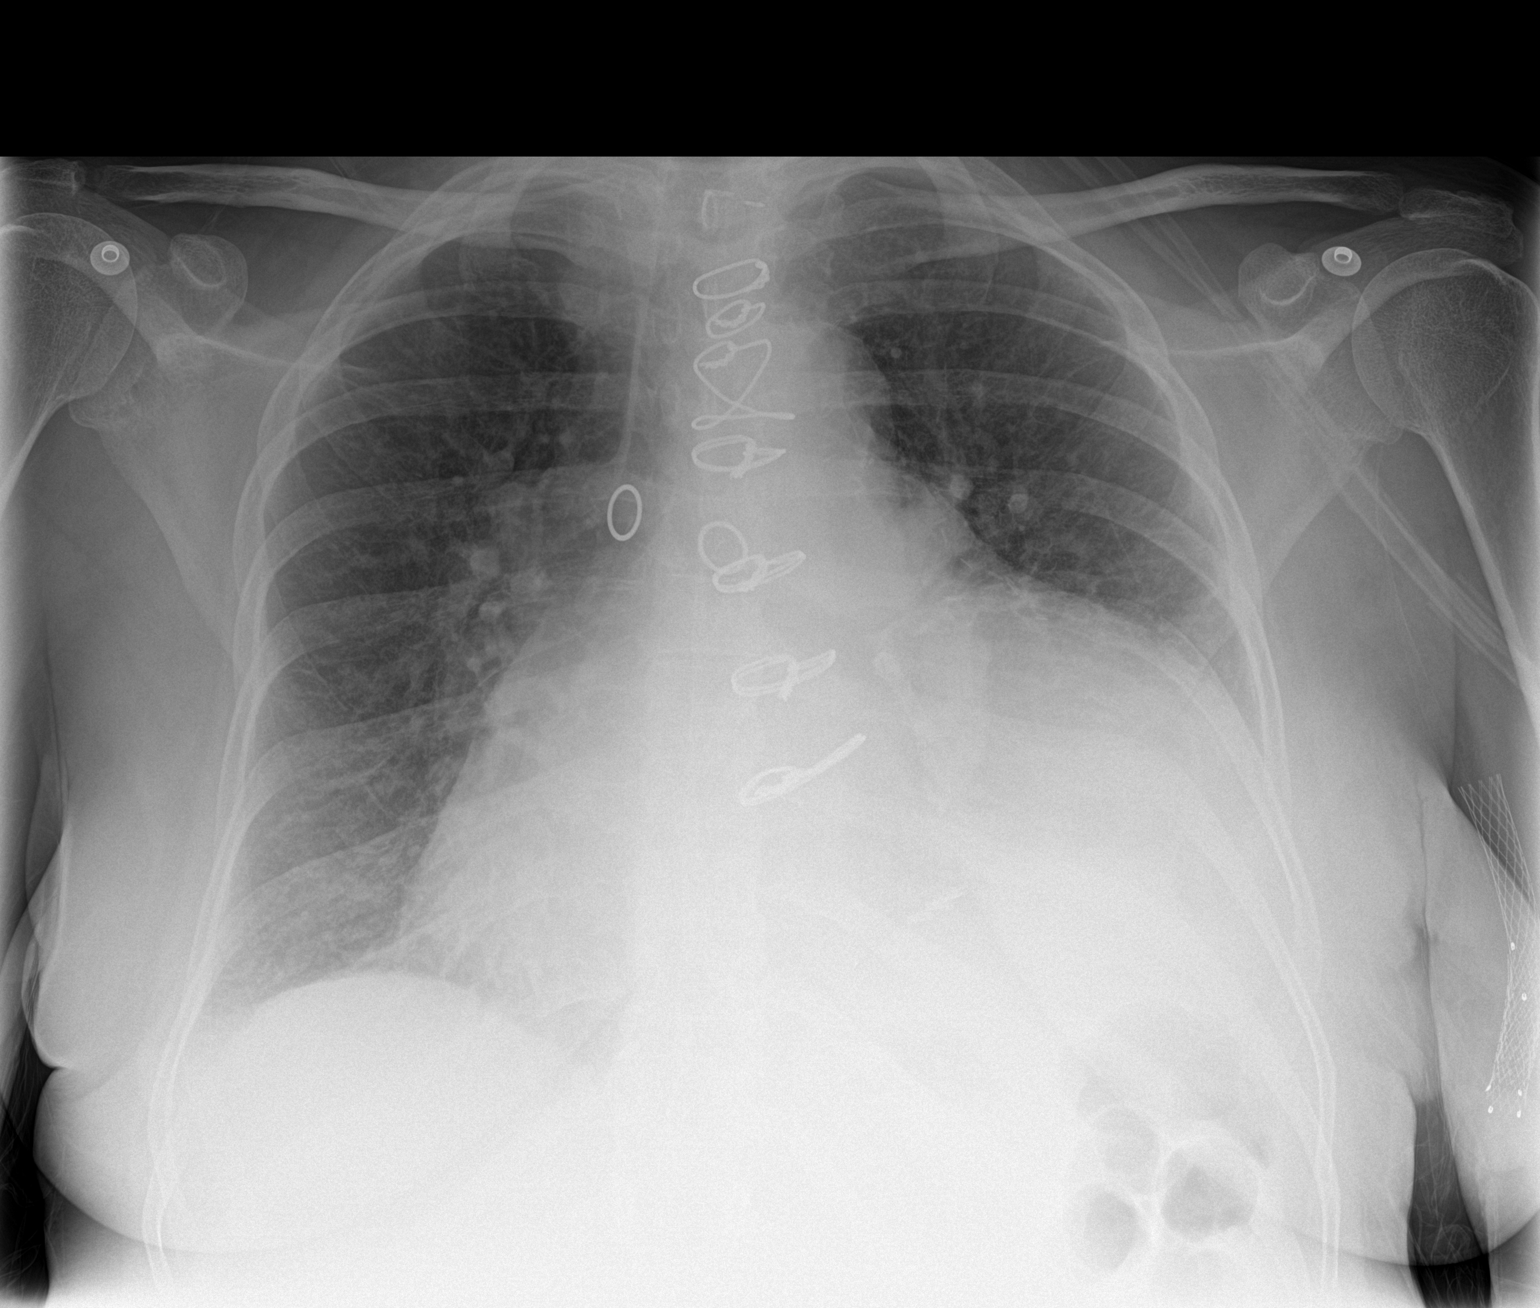

[2 of 2 positions shown; findings below may reference images not displayed]

FINDINGS: Trachea is midline. There are 7 intact sternotomy wires. The lowest
3 wires appear slightly deviated to the left, similar to the scout
view from 04/03/2014, suggesting that the appearance positional,
rather than pathologic. Cardiopericardial silhouette is enlarged and
globular in configuration but slightly less prominent than on
[DATE]. Lungs are grossly clear. Difficult to exclude a left
pleural effusion.
IMPRESSION: 1. Enlarged cardiopericardial silhouette, globular in configuration,
but slightly decreased in size from 04/03/2014. Findings are
indicative of chronic enlargement with a pericardial effusion.
2. Difficult to exclude a left pleural effusion.

## 2016-05-27 IMAGING — US ABDOMEN ULTRASOUND LIMITED
1 series · 14 of 25 positions shown · non-contrast
Comparison: 12/23/2012

CLINICAL DATA: Right upper quadrant and epigastric pain. Nausea and
vomiting.

EXAM:
US ABDOMEN LIMITED - RIGHT UPPER QUADRANT

[Series 1: abdomen ultrasound limited · 0.24mm/px · 14 of 45 slices shown]
[im 1/45]
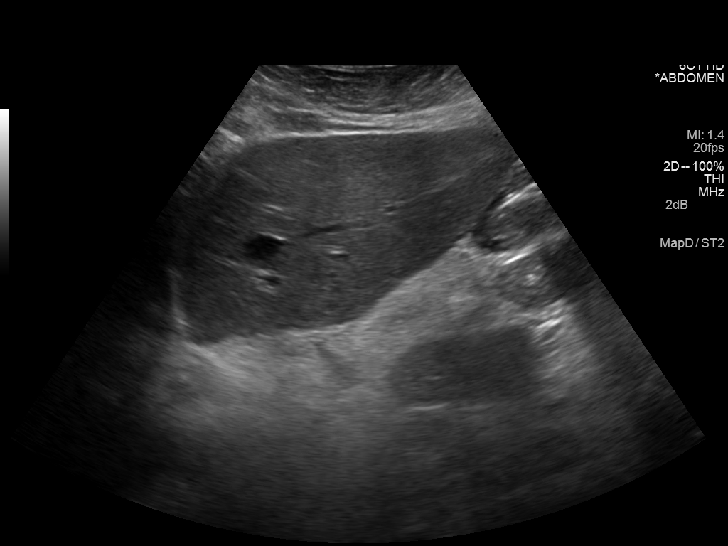
[im 4/45]
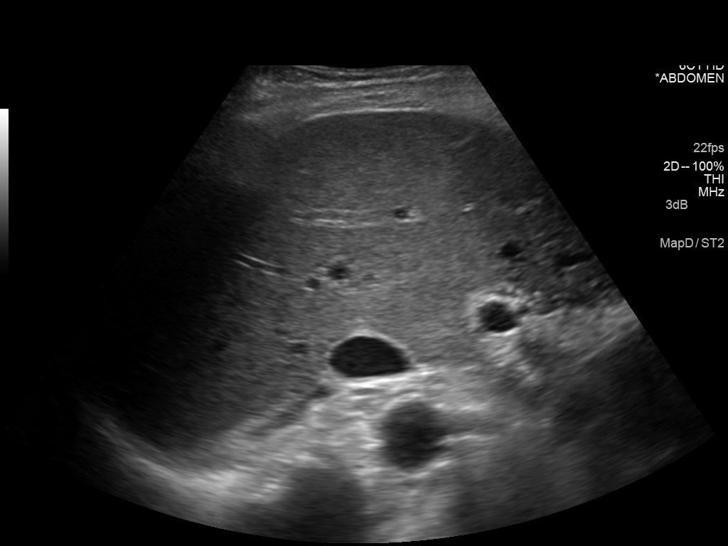
[im 8/45]
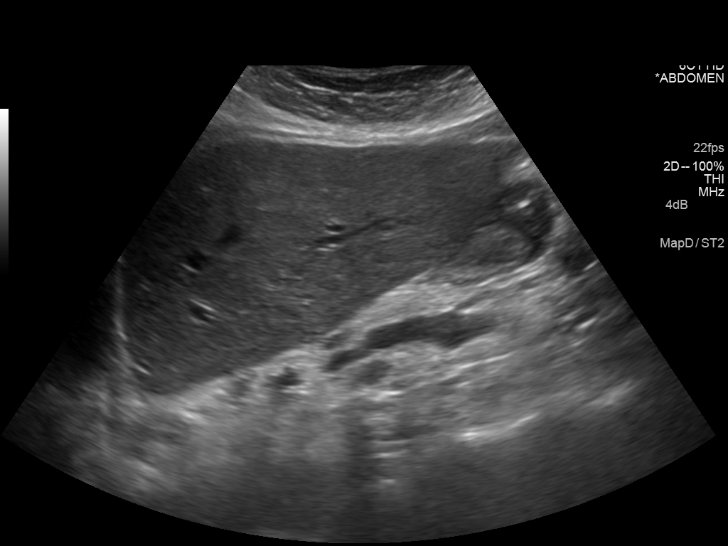
[im 12/45]
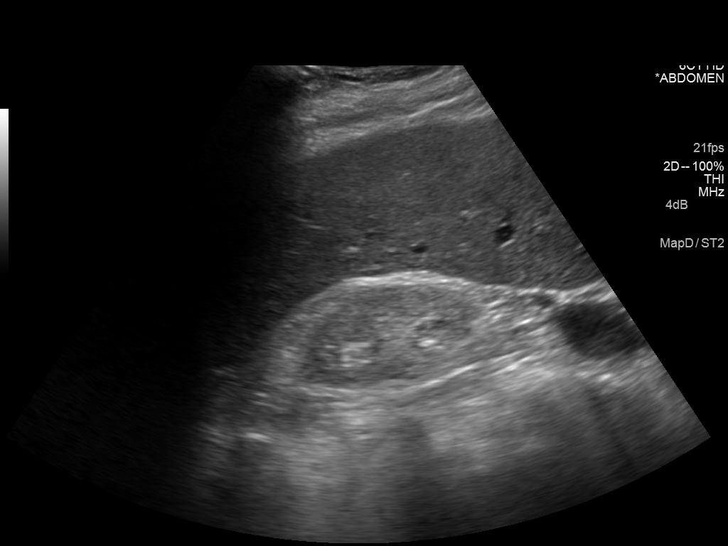
[im 15/45]
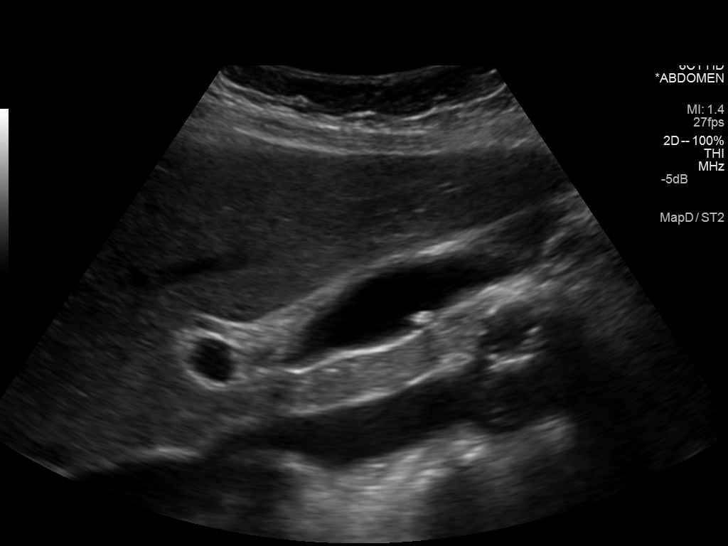
[im 17/45]
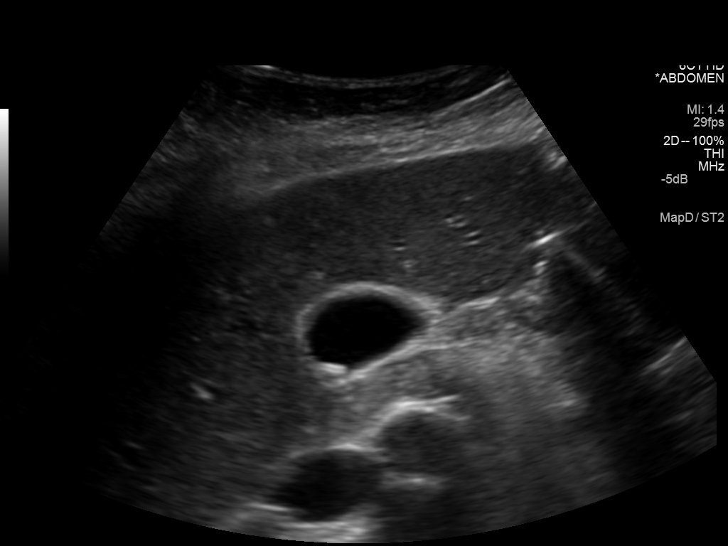
[im 21/45]
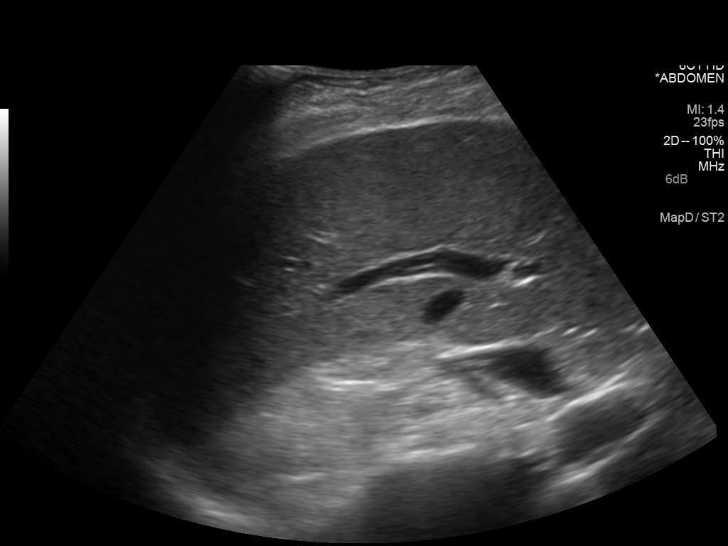
[im 24/45]
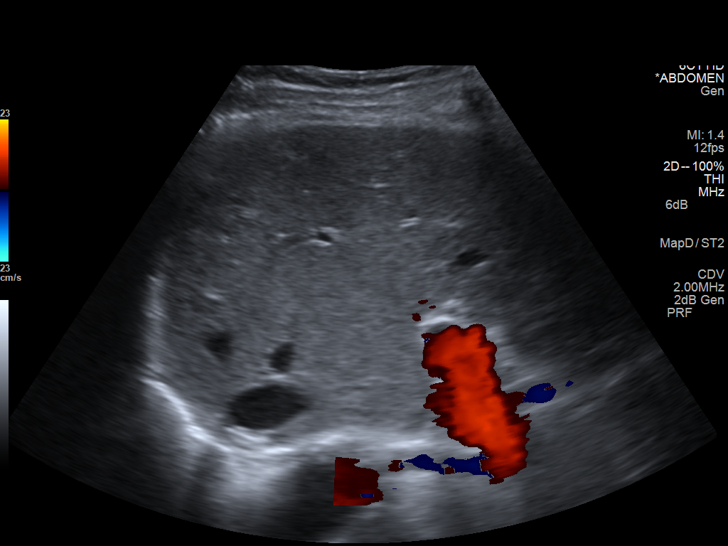
[im 28/45]
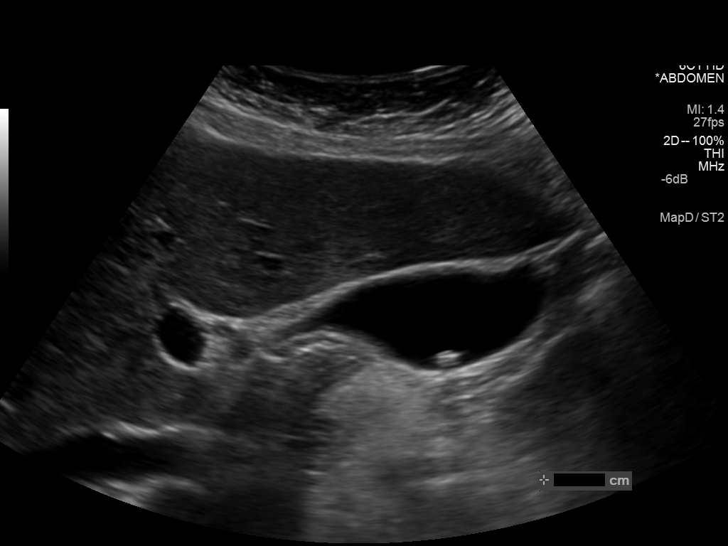
[im 30/45]
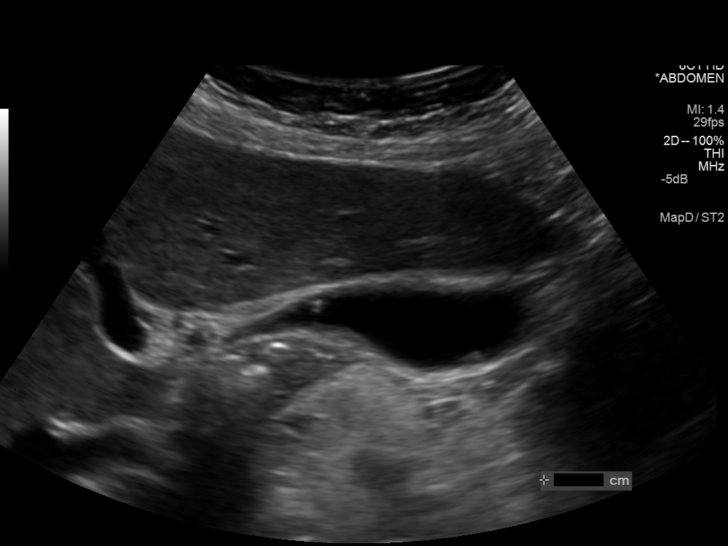
[im 34/45]
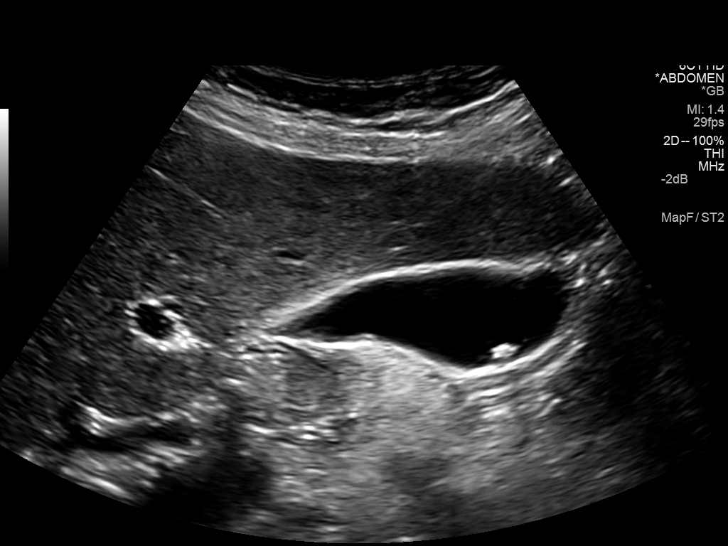
[im 37/45]
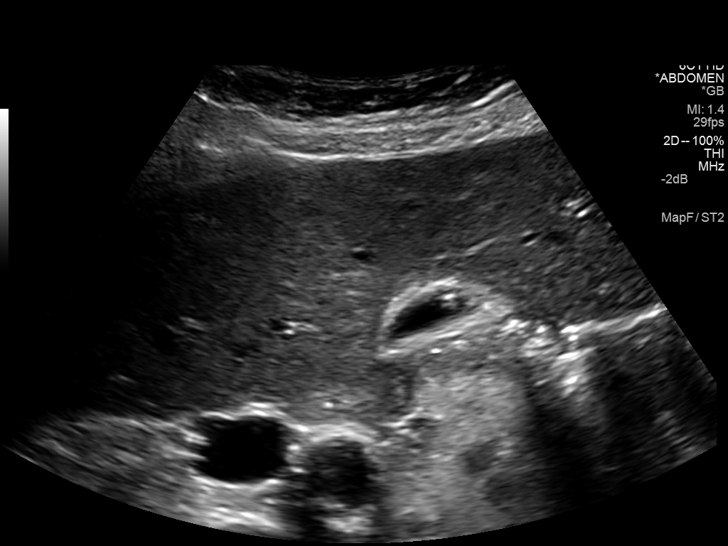
[im 41/45]
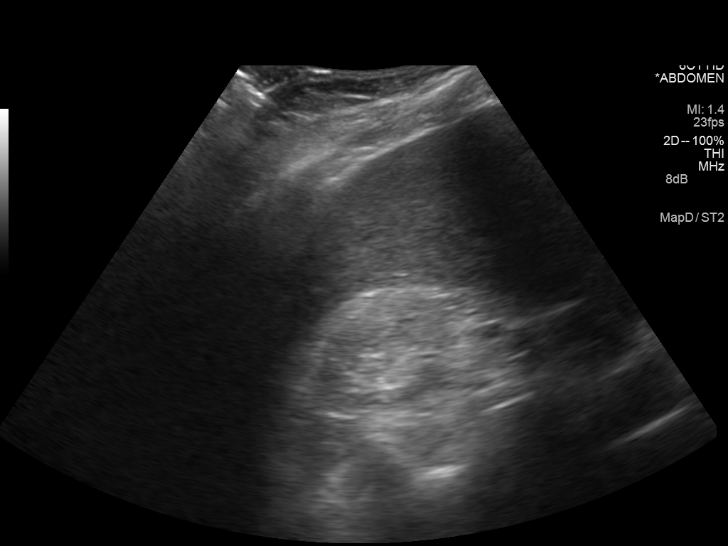
[im 45/45]
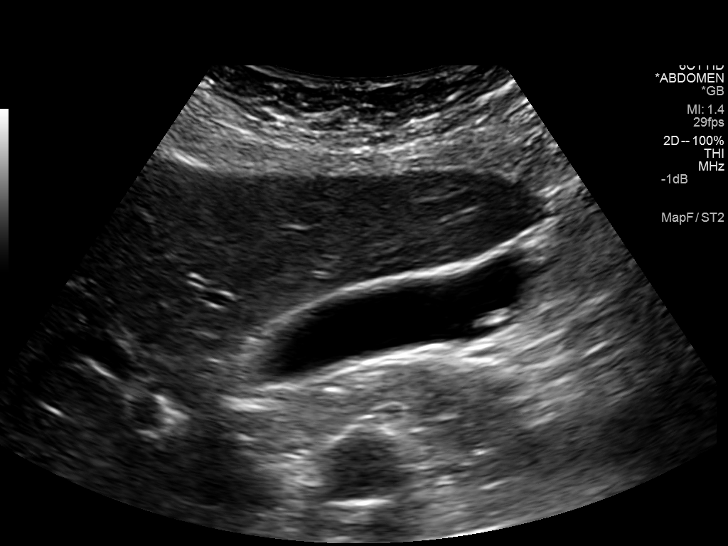

[14 of 25 positions shown; findings below may reference images not displayed]

FINDINGS: Gallbladder:

There is a mobile 5 mm gallstone. There is a 3 mm echogenic non
shadowing structure on the anterior gallbladder wall which is
nonmobile. No wall thickening or focal tenderness.

Common bile duct:

Diameter: 4 mm

Liver:

No focal lesion identified. Within normal limits in parenchymal
echogenicity.

Small an echogenic right kidney in this patient with history of
end-stage renal disease.
IMPRESSION: 1. Cholelithiasis.  No evidence for cholecystitis.
2. Adherent gallstone stone versus 3 mm cholesterol polyp.

## 2016-06-06 IMAGING — CR DG CHEST 1V PORT
1 series · 1 of 1 positions shown · non-contrast
Comparison: 05/11/2014

CLINICAL DATA: Chest pain.  No shortness of breath or cough.

EXAM:
PORTABLE CHEST - 1 VIEW

[dxr portable chest single view]
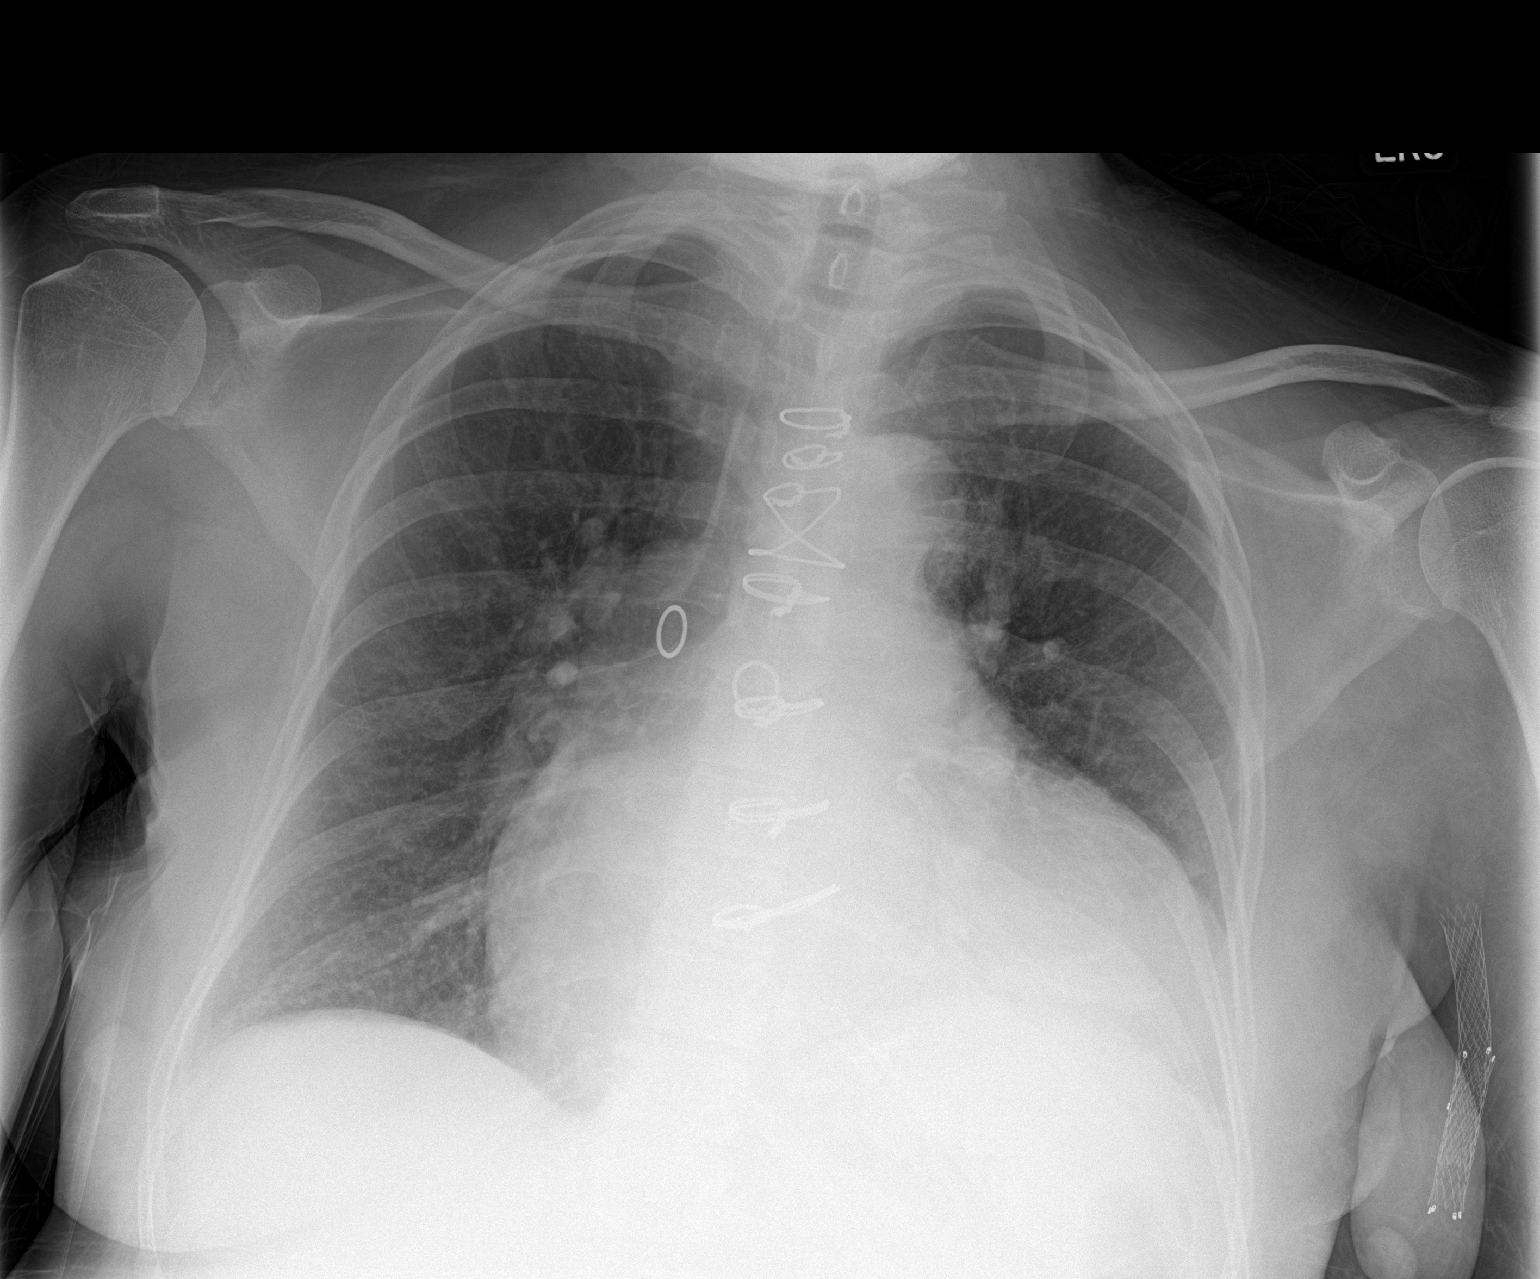

[1 of 1 positions shown; findings below may reference images not displayed]

FINDINGS: Postoperative changes in the mediastinum. Cardiac enlargement.
Pulmonary vascularity is normal. No focal airspace consolidation. No
blunting of costophrenic angles. Suggestion of mild atelectasis in
the left lung base behind the heart. Vascular graft in the left
upper arm.
IMPRESSION: Cardiac enlargement. No evidence of active pulmonary disease.
Possible linear atelectasis in the left lung base behind the heart.

## 2016-06-06 IMAGING — CT CT HEAD WITHOUT CONTRAST
1 of 2 series · 13 of 30 positions shown, 17 images · non-contrast
Comparison: MRI brain 10/10/2013.  CT head 10/08/2013.

CLINICAL DATA: Headache, vomiting, generalized pain. Dialysis today
and patient feels underweight. Patient was here last night for
headache.

EXAM:
CT HEAD WITHOUT CONTRAST
TECHNIQUE: Contiguous axial images were obtained from the base of the skull
through the vertex without intravenous contrast.

[Series 4: head wo recon · axial · 0.35mm/px · z∈[-115,+4]mm · 13 of 29 slices shown, 17 images]
[im 3/29  brain]
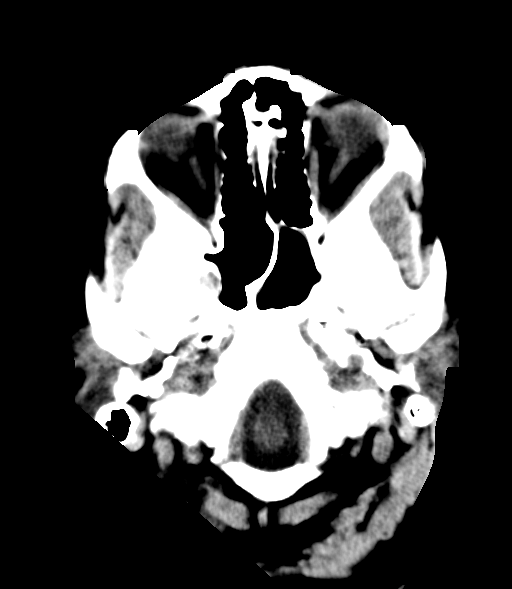
[im 3/29  bone]
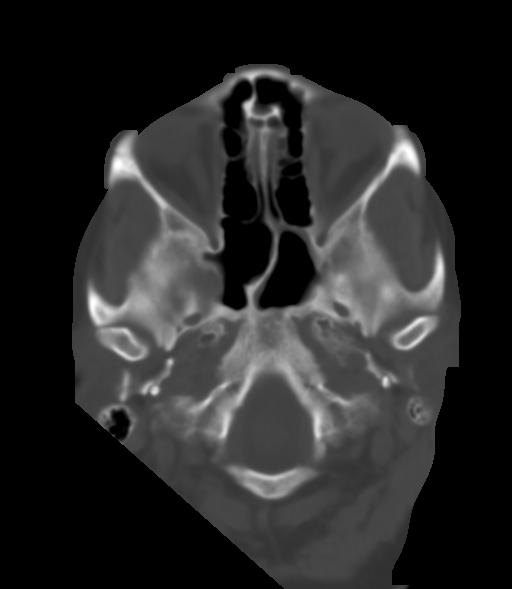
[im 5/29  brain]
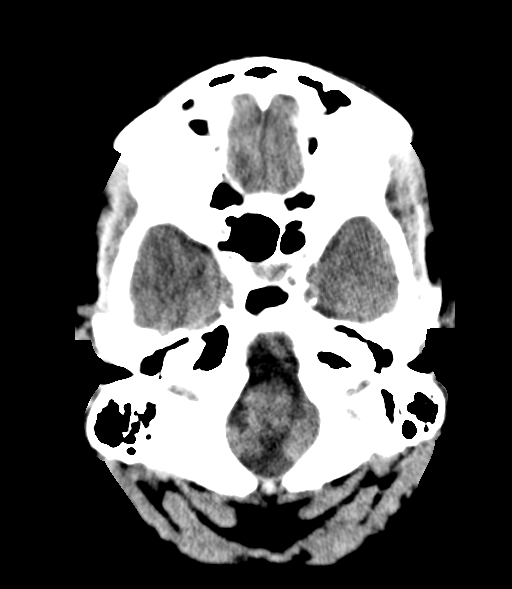
[im 7/29  brain]
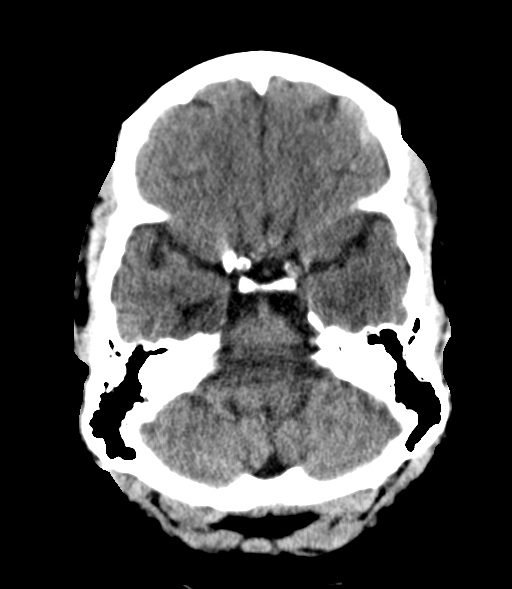
[im 9/29  brain]
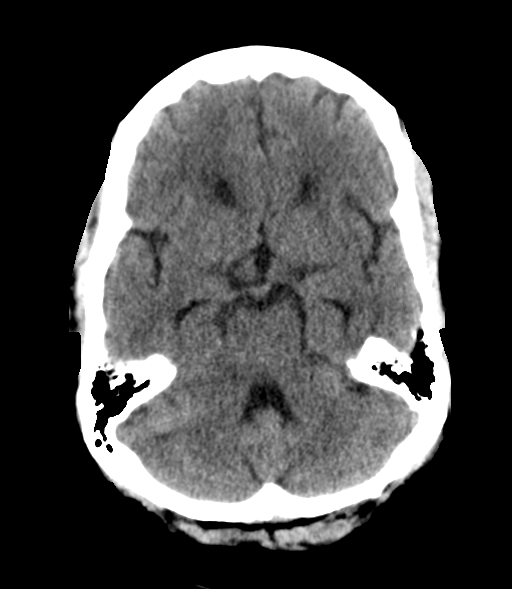
[im 11/29  brain]
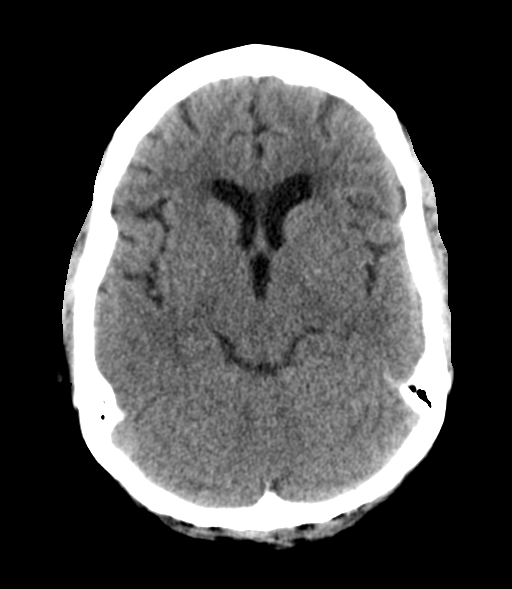
[im 11/29  bone]
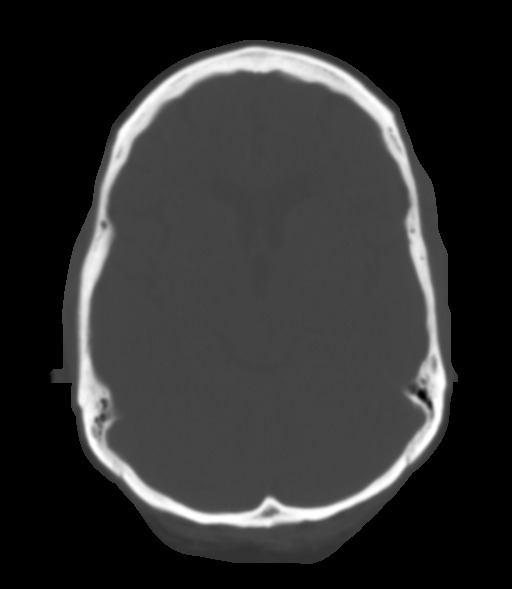
[im 13/29  brain]
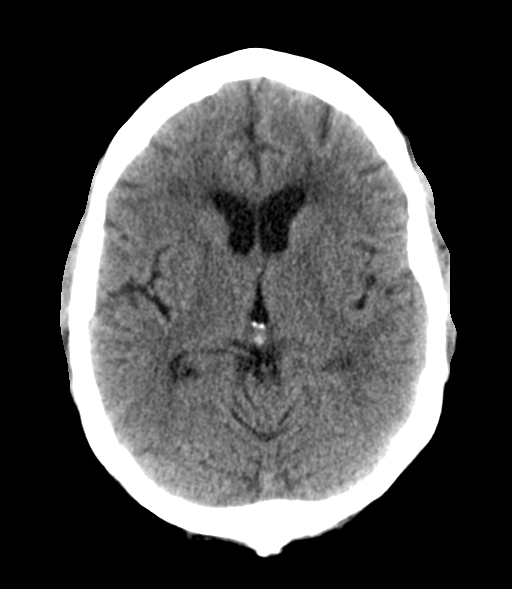
[im 15/29  brain]
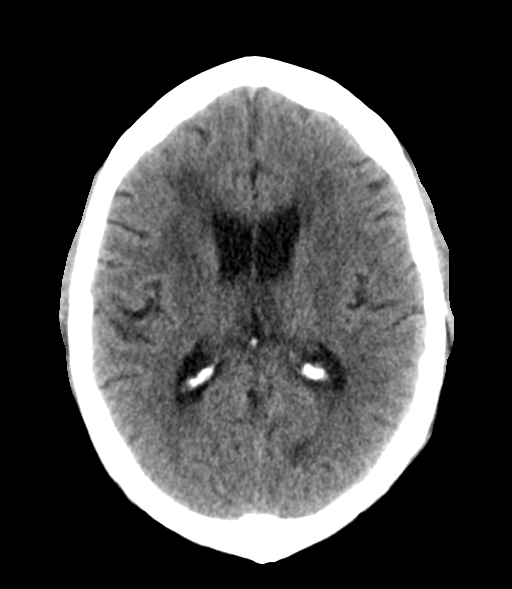
[im 17/29  brain]
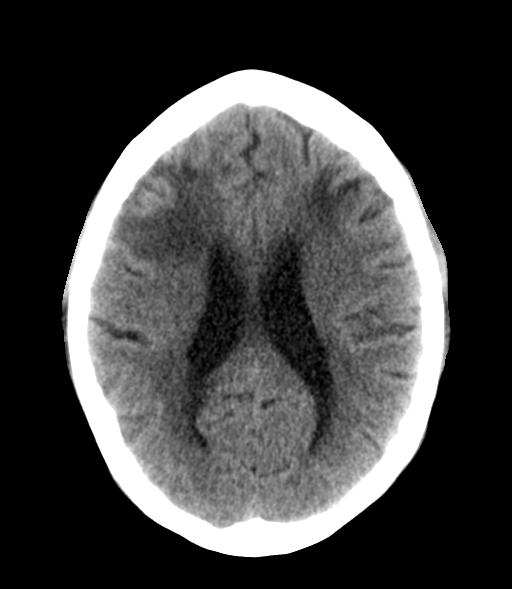
[im 19/29  brain]
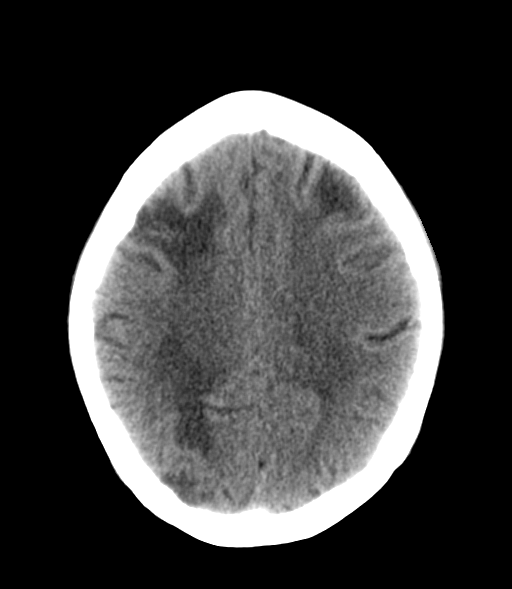
[im 19/29  bone]
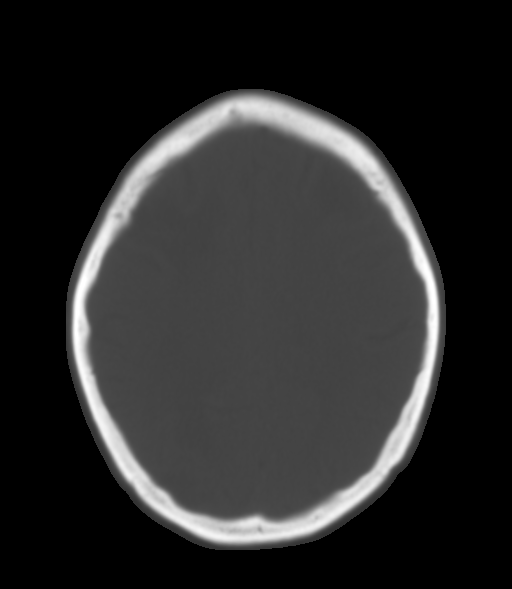
[im 21/29  brain]
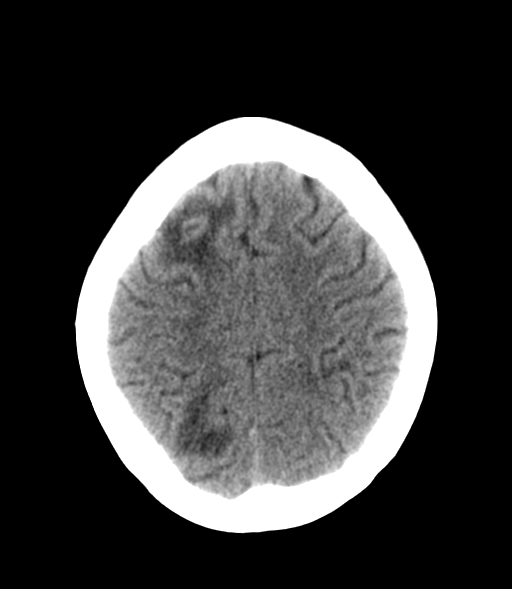
[im 23/29  brain]
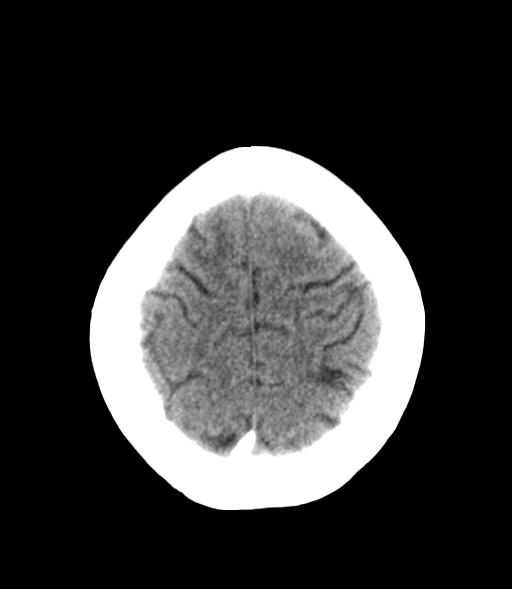
[im 25/29  brain]
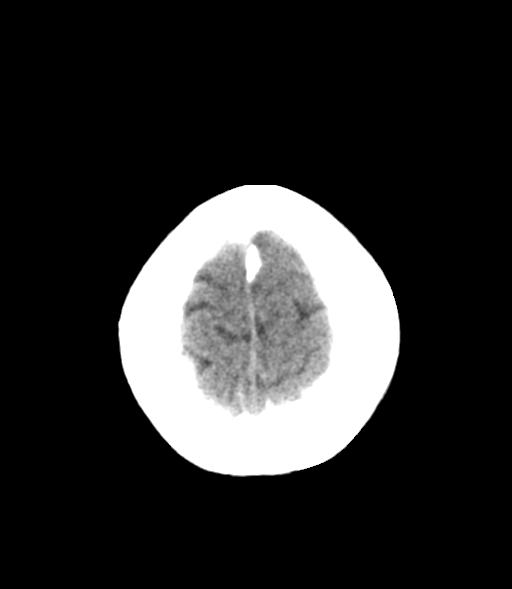
[im 27/29  brain]
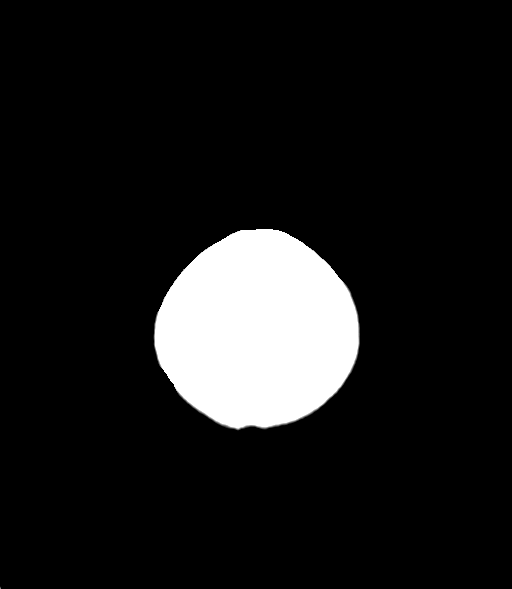
[im 27/29  bone]
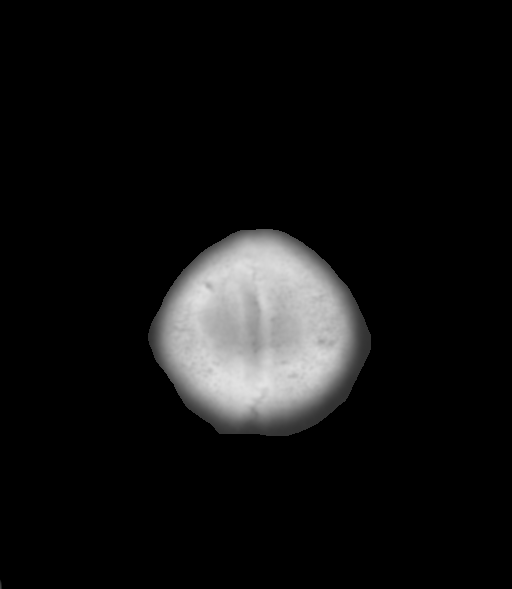

[13 of 30 positions shown; findings below may reference images not displayed]

FINDINGS: Mild diffuse cerebral atrophy. Diffuse low-attenuation change
throughout the deep white matter consistent with small vessel
ischemia. Possible infarct in the right frontal and parietal and
left frontal regions. No mass effect or midline shift. No abnormal
extra-axial fluid collections. Gray-white matter junctions are
distinct. Basal cisterns are not effaced. No evidence of acute
intracranial hemorrhage. No depressed skull fractures. Mucosal
thickening in the sphenoid sinuses. Mastoid air cells are not
opacified. Vascular calcifications.
IMPRESSION: No acute intracranial abnormalities. Chronic atrophy and small
vessel ischemic changes. Old bilateral infarcts. No significant
change since previous study.

## 2016-06-07 IMAGING — MR MRA HEAD WITHOUT CONTRAST
1 series · 24 of 48 positions shown · non-contrast
Comparison: CT of the head June 23, 2014 at 4553 hours and MRI of
the head October 10, 2013

CLINICAL DATA: Headache, weakness on RIGHT side. Nausea for 1 day.
GI issues. End-stage renal disease.

EXAM:
MRI HEAD WITHOUT CONTRAST
MRA HEAD WITHOUT CONTRAST
MRV HEAD WITHOUT CONTRAST
TECHNIQUE: Multiplanar, multiecho pulse sequences of the brain and surrounding
structures were obtained without and with intravenous contrast.
Angiographic images of the head were obtained using MRA and MRV
technique without contrast. MIP images provided.

[Series 3: TOF · axial · 0.8mm · 0.39mm/px · z∈[-95,+9]mm · 24 of 138 slices shown]
[im 1/138]
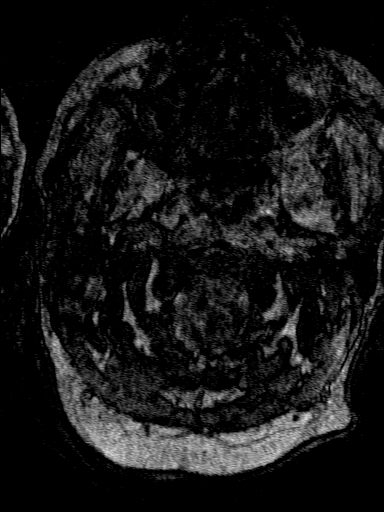
[im 3/138]
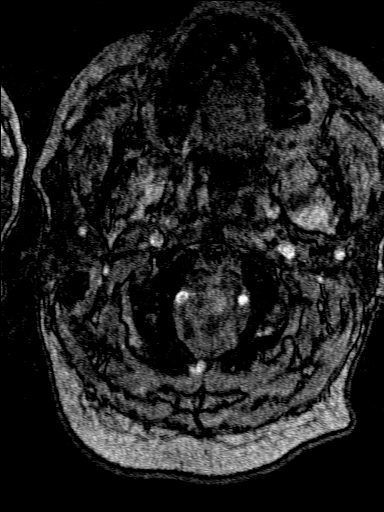
[im 6/138]
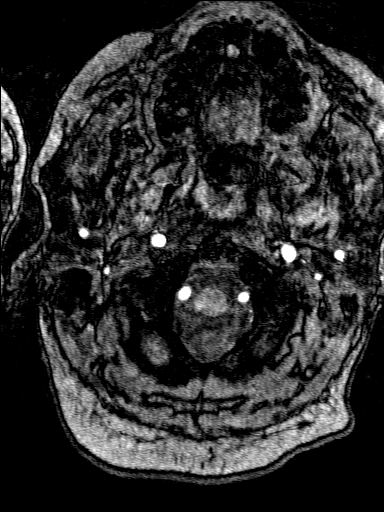
[im 9/138]
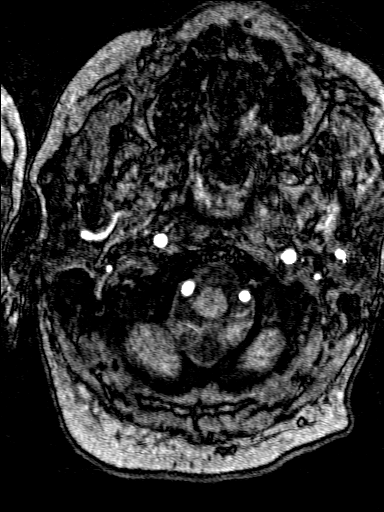
[im 12/138]
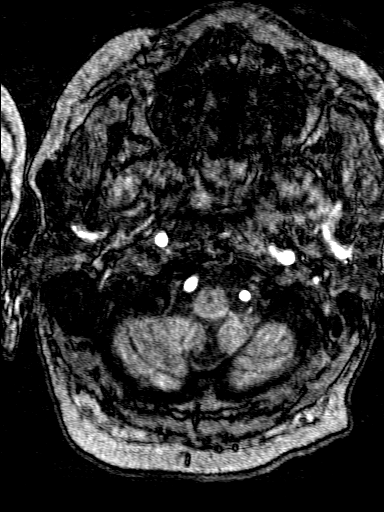
[im 15/138]
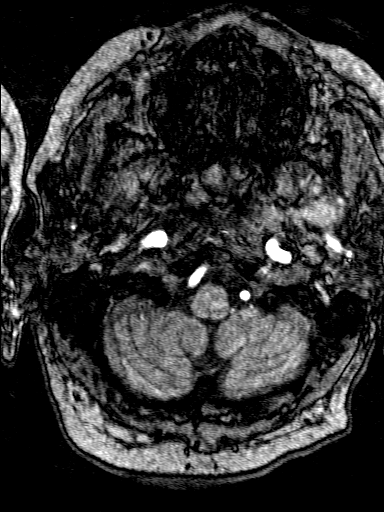
[im 18/138]
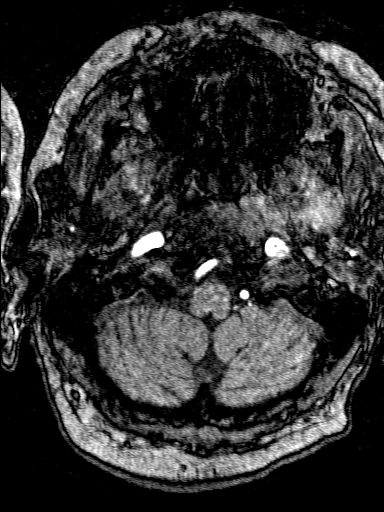
[im 21/138]
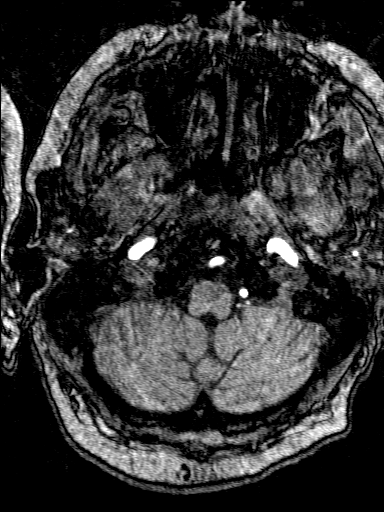
[im 24/138]
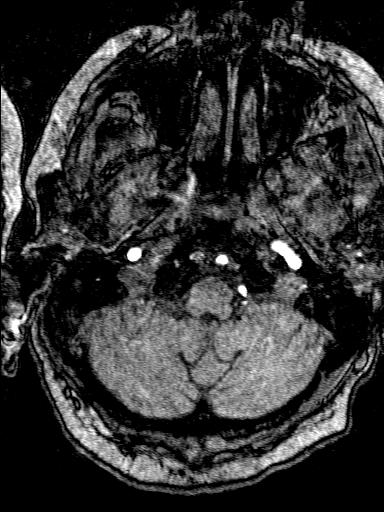
[im 27/138]
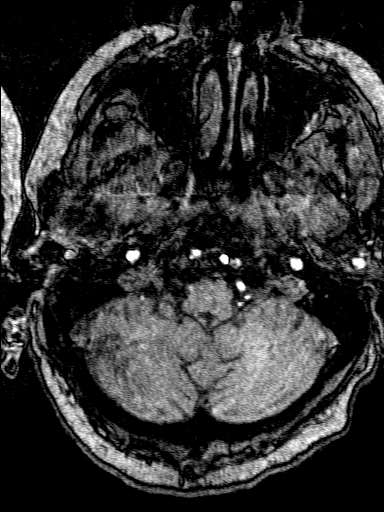
[im 30/138]
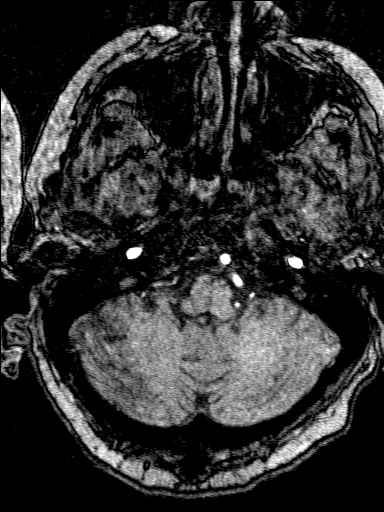
[im 33/138]
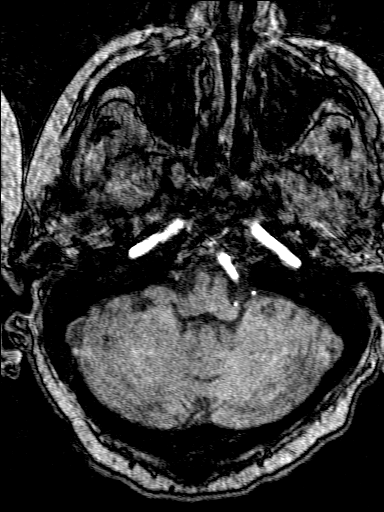
[im 35/138]
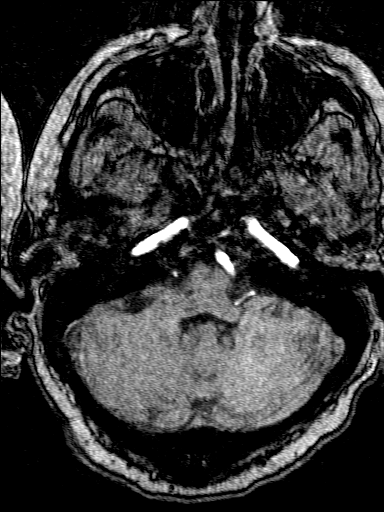
[im 38/138]
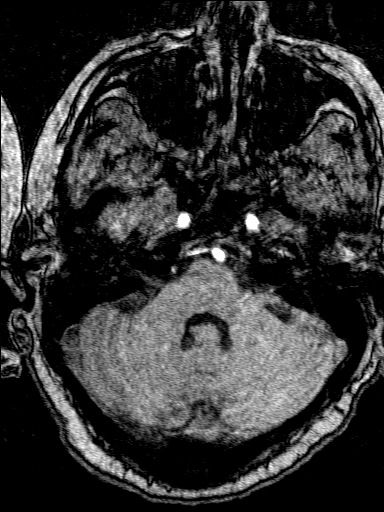
[im 41/138]
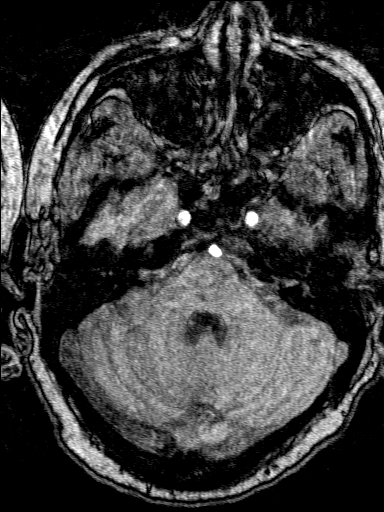
[im 44/138]
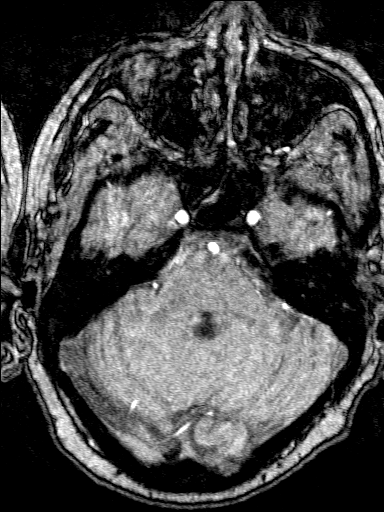
[im 47/138]
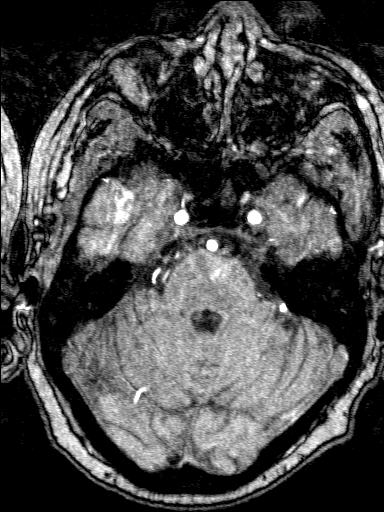
[im 62/138]
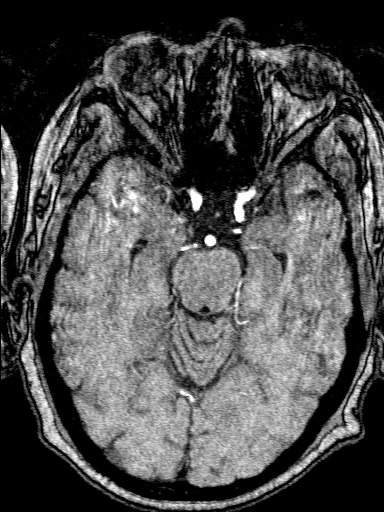
[im 70/138]
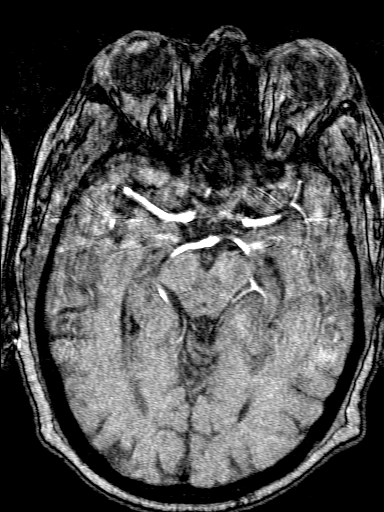
[im 79/138]
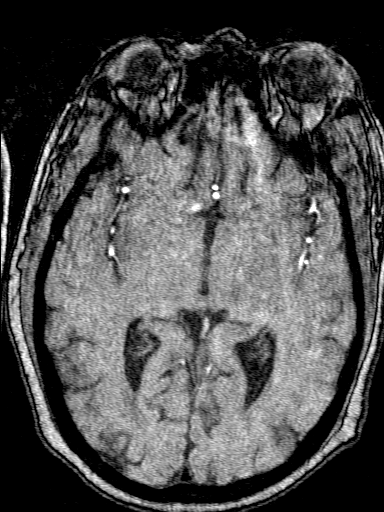
[im 97/138]
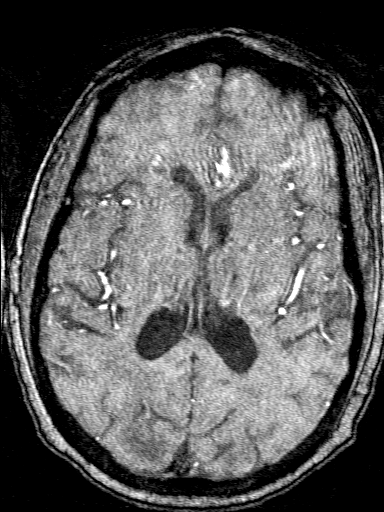
[im 114/138]
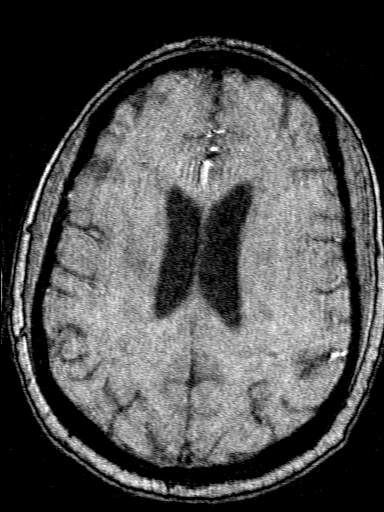
[im 117/138]
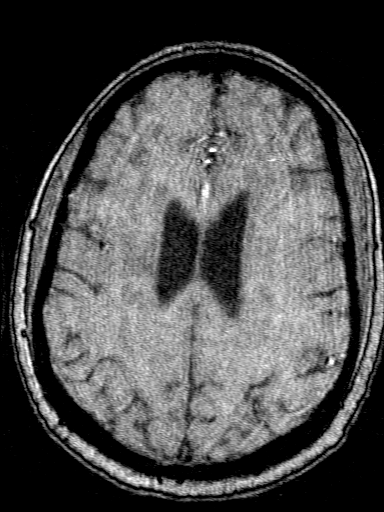
[im 132/138]
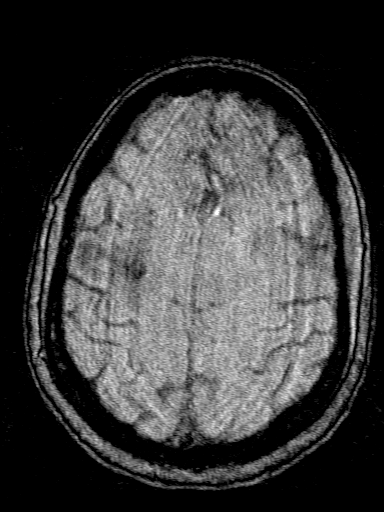

[24 of 48 positions shown; findings below may reference images not displayed]

FINDINGS: MRI HEAD FINDINGS

Mild motion degraded examination. No reduced diffusion to suggest
acute ischemia. Minimal susceptibility in the RIGHT frontal lobe
corresponding to known remote infarcts may reflect mineralization.

Multifocal RIGHT occipital lobe encephalomalacia is unchanged. RIGHT
greater LEFT bifrontal encephalomalacia is unchanged. LEFT mesial
parietal lobe encephalomalacia is unchanged. Additional patchy
supratentorial and pontine white matter FLAIR T2 hyperintensities.
Small remote RIGHT greater than LEFT inferior cerebellar infarcts.

No abnormal extra-axial fluid collections. Ocular globes and orbital
contents are unremarkable though not tailored for evaluation. No
abnormal sellar expansion. No cerebellar tonsillar ectopia. RIGHT
maxillary sinus and LEFT sphenoid mucosal thickening without
paranasal sinus air-fluid levels.

MRA HEAD FINDINGS

Motion degraded examination.

Anterior circulation: Normal flow related enhancement of the
included cervical, petrous, cavernous and supra clinoid internal
carotid arteries. Patent anterior communicating artery. Normal flow
related enhancement of the anterior and middle cerebral arteries,
including more distal segments.

No large vessel occlusion, hemodynamically significant stenosis.
Limited assessment for aneurysm due to motion. Mild luminal
irregularity in the mid to distal vessels.

Posterior circulation: Codominant vertebral arteries. Basilar artery
is patent, with normal flow related enhancement of the main branch
vessels. Normal flow related enhancement of the posterior cerebral
arteries.

No large vessel occlusion, hemodynamically significant stenosis.
Mild luminal irregularity of the mid to distal vessels.

MRV HEAD FINDINGS

Flow related enhancement is superior sagittal sinus, torcula for
roughly, transverse sinuses, sigmoid sinuses, and internal jugular
veins. Flow early enhancement internal cerebral veins. Motion
degrades overall evaluation.
IMPRESSION: MRI HEAD: Motion degraded examination, no acute intracranial
process.

Multifocal encephalomalacia suggest remote infarcts, spanning
multiple vascular territories. Small cerebellar infarcts. Moderate
white matter changes suggest chronic small vessel ischemic disease,
advanced for age.

MRA HEAD: Motion degraded examination, no large vessel occlusion.
Mild luminal regularity of the mid to distal vessels may reflect
atherosclerosis or motion artifact.

MRV HEAD: Motion degraded examination, no dural venous sinus
thrombosis.

  By: Anhchi Sovago

## 2016-06-07 IMAGING — MR MR [PERSON_NAME] HEAD OR NECK WO/W CM
1 series · 48 of 48 positions shown · non-contrast
Comparison: CT of the head June 23, 2014 at 4553 hours and MRI of
the head October 10, 2013

CLINICAL DATA: Headache, weakness on RIGHT side. Nausea for 1 day.
GI issues. End-stage renal disease.

EXAM:
MRI HEAD WITHOUT CONTRAST
MRA HEAD WITHOUT CONTRAST
MRV HEAD WITHOUT CONTRAST
TECHNIQUE: Multiplanar, multiecho pulse sequences of the brain and surrounding
structures were obtained without and with intravenous contrast.
Angiographic images of the head were obtained using MRA and MRV
technique without contrast. MIP images provided.

[Series 2: [id number]_(person_name) · sagittal · 3.0mm · 0.98mm/px · 48 of 83 slices shown]
[im 1/83]
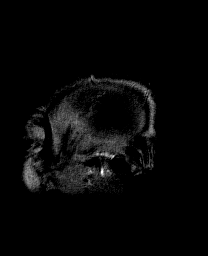
[im 2/83]
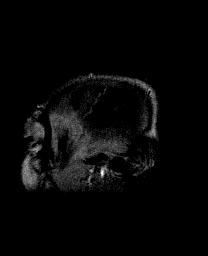
[im 4/83]
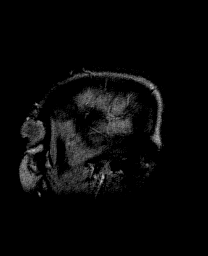
[im 6/83]
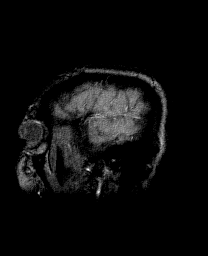
[im 7/83]
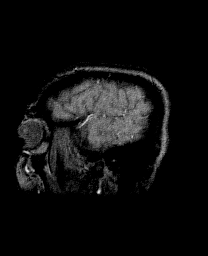
[im 9/83]
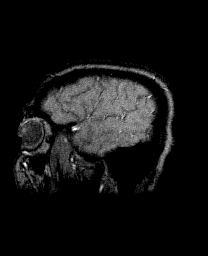
[im 11/83]
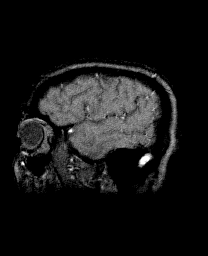
[im 13/83]
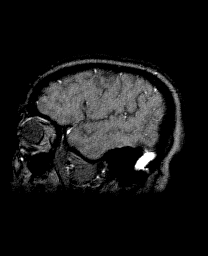
[im 14/83]
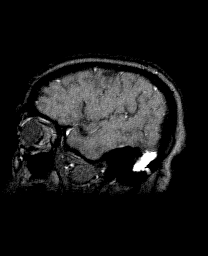
[im 16/83]
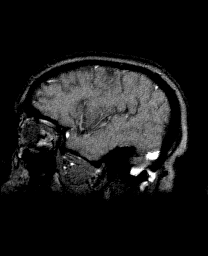
[im 18/83]
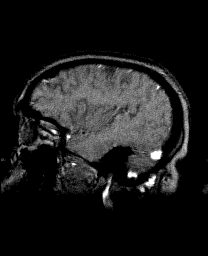
[im 20/83]
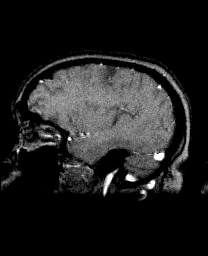
[im 21/83]
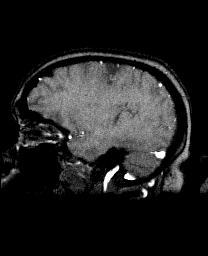
[im 23/83]
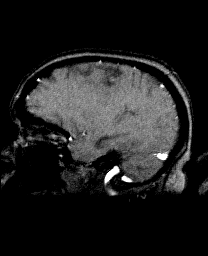
[im 25/83]
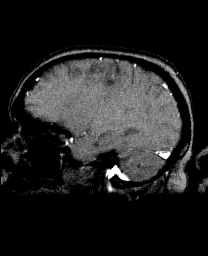
[im 27/83]
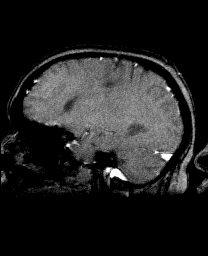
[im 28/83]
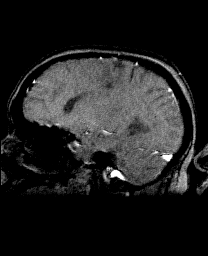
[im 30/83]
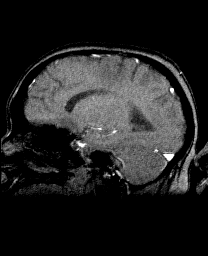
[im 32/83]
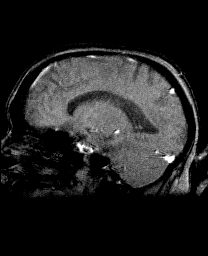
[im 34/83]
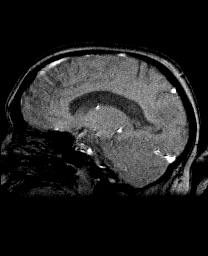
[im 35/83]
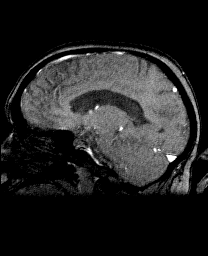
[im 37/83]
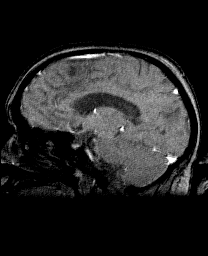
[im 39/83]
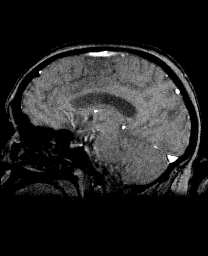
[im 41/83]
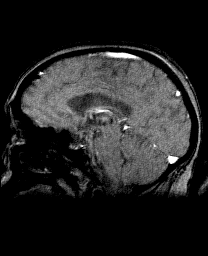
[im 42/83]
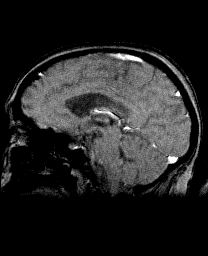
[im 44/83]
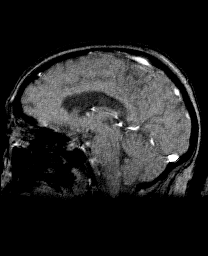
[im 46/83]
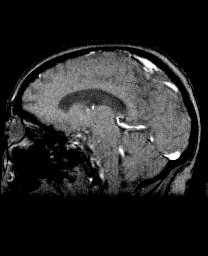
[im 48/83]
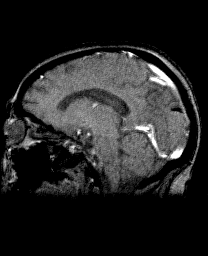
[im 49/83]
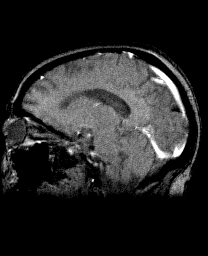
[im 51/83]
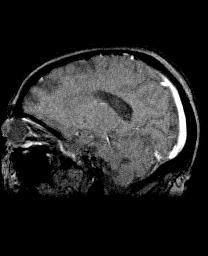
[im 53/83]
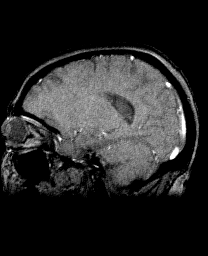
[im 55/83]
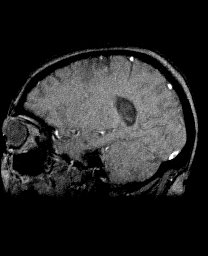
[im 56/83]
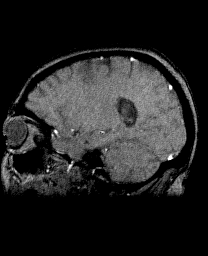
[im 58/83]
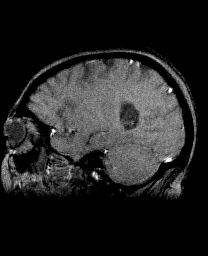
[im 60/83]
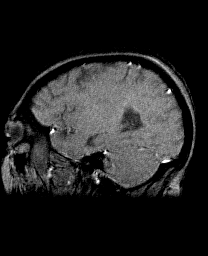
[im 62/83]
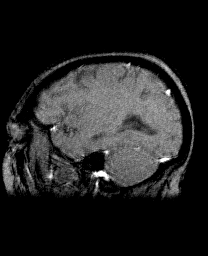
[im 63/83]
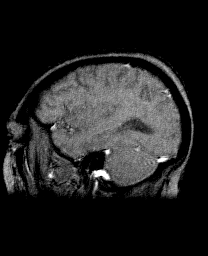
[im 65/83]
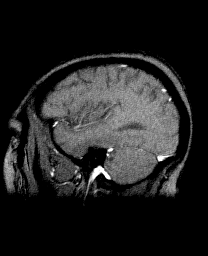
[im 67/83]
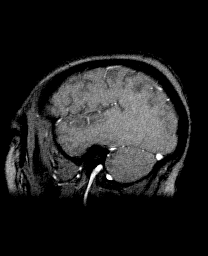
[im 69/83]
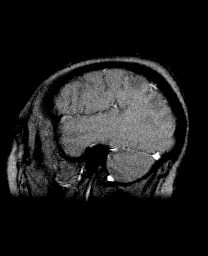
[im 70/83]
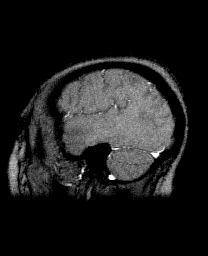
[im 72/83]
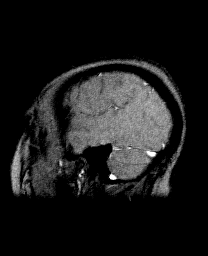
[im 74/83]
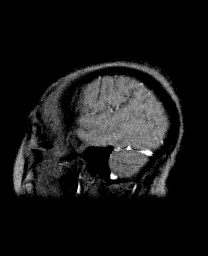
[im 76/83]
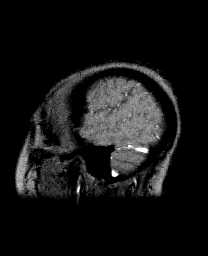
[im 77/83]
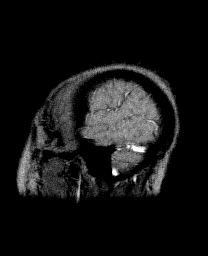
[im 79/83]
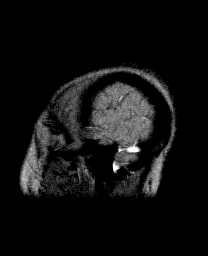
[im 81/83]
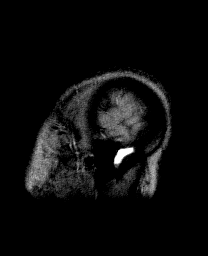
[im 83/83]
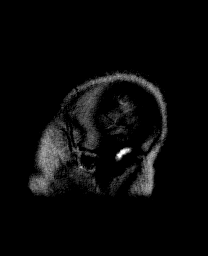

[48 of 48 positions shown; findings below may reference images not displayed]

FINDINGS: MRI HEAD FINDINGS

Mild motion degraded examination. No reduced diffusion to suggest
acute ischemia. Minimal susceptibility in the RIGHT frontal lobe
corresponding to known remote infarcts may reflect mineralization.

Multifocal RIGHT occipital lobe encephalomalacia is unchanged. RIGHT
greater LEFT bifrontal encephalomalacia is unchanged. LEFT mesial
parietal lobe encephalomalacia is unchanged. Additional patchy
supratentorial and pontine white matter FLAIR T2 hyperintensities.
Small remote RIGHT greater than LEFT inferior cerebellar infarcts.

No abnormal extra-axial fluid collections. Ocular globes and orbital
contents are unremarkable though not tailored for evaluation. No
abnormal sellar expansion. No cerebellar tonsillar ectopia. RIGHT
maxillary sinus and LEFT sphenoid mucosal thickening without
paranasal sinus air-fluid levels.

MRA HEAD FINDINGS

Motion degraded examination.

Anterior circulation: Normal flow related enhancement of the
included cervical, petrous, cavernous and supra clinoid internal
carotid arteries. Patent anterior communicating artery. Normal flow
related enhancement of the anterior and middle cerebral arteries,
including more distal segments.

No large vessel occlusion, hemodynamically significant stenosis.
Limited assessment for aneurysm due to motion. Mild luminal
irregularity in the mid to distal vessels.

Posterior circulation: Codominant vertebral arteries. Basilar artery
is patent, with normal flow related enhancement of the main branch
vessels. Normal flow related enhancement of the posterior cerebral
arteries.

No large vessel occlusion, hemodynamically significant stenosis.
Mild luminal irregularity of the mid to distal vessels.

MRV HEAD FINDINGS

Flow related enhancement is superior sagittal sinus, torcula for
roughly, transverse sinuses, sigmoid sinuses, and internal jugular
veins. Flow early enhancement internal cerebral veins. Motion
degrades overall evaluation.
IMPRESSION: MRI HEAD: Motion degraded examination, no acute intracranial
process.

Multifocal encephalomalacia suggest remote infarcts, spanning
multiple vascular territories. Small cerebellar infarcts. Moderate
white matter changes suggest chronic small vessel ischemic disease,
advanced for age.

MRA HEAD: Motion degraded examination, no large vessel occlusion.
Mild luminal regularity of the mid to distal vessels may reflect
atherosclerosis or motion artifact.

MRV HEAD: Motion degraded examination, no dural venous sinus
thrombosis.

  By: Anhchi Sovago

## 2016-06-07 IMAGING — US US CAROTID DUPLEX BILAT
1 series · 13 of 24 positions shown · non-contrast
Comparison: Brain MRI /MRA - 06/24/2014

CLINICAL DATA: History of hypertension, stroke, CAD (post prior
myocardial infarction and CABG).

EXAM:
BILATERAL CAROTID DUPLEX ULTRASOUND
TECHNIQUE: Gray scale imaging, color Doppler and duplex ultrasound were
performed of bilateral carotid and vertebral arteries in the neck.

[Series 1: us carotid duplex bilat · 0.06mm/px · 13 of 64 slices shown]
[im 1/64]
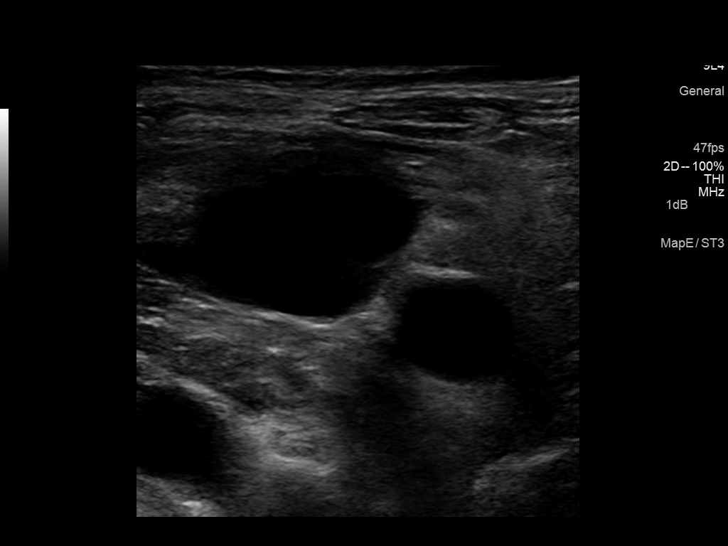
[im 6/64]
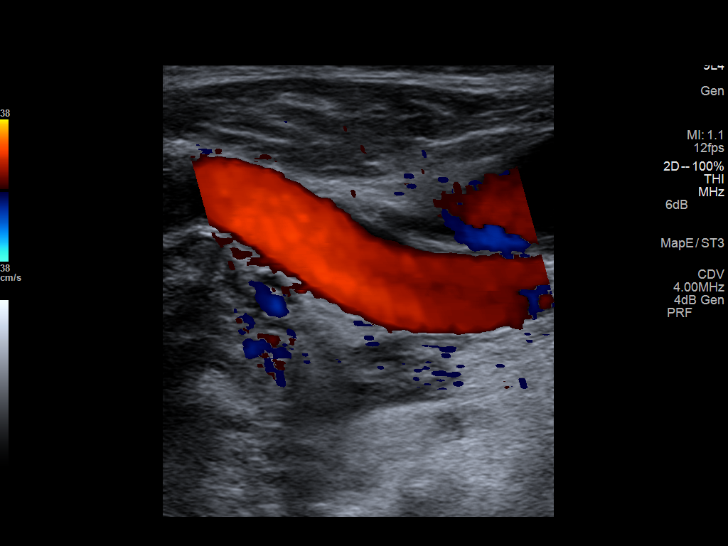
[im 11/64]
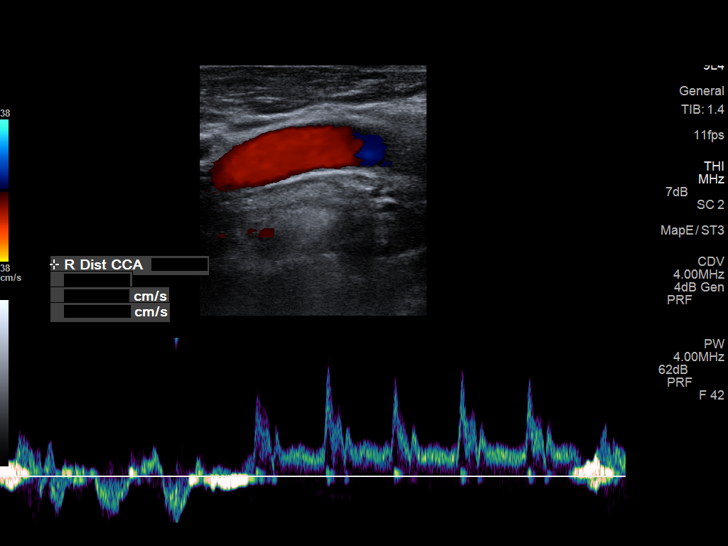
[im 17/64]
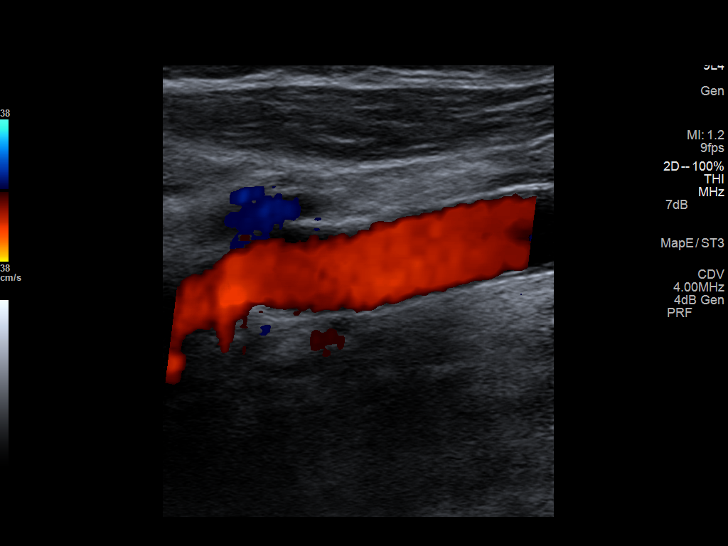
[im 22/64]
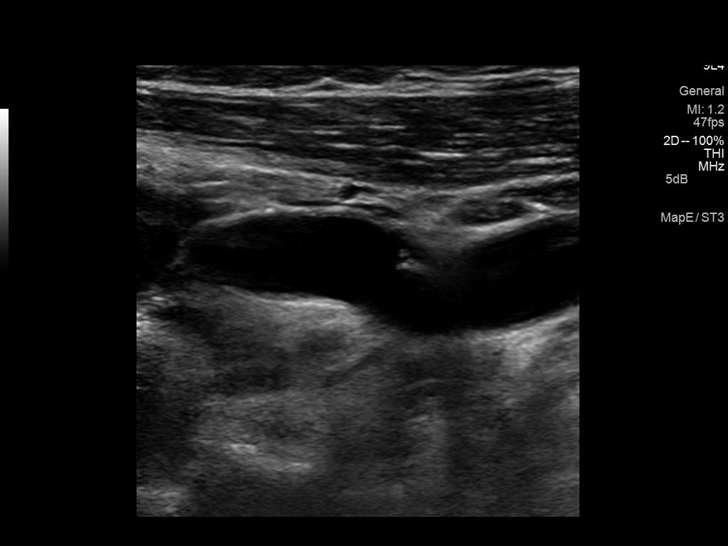
[im 28/64]
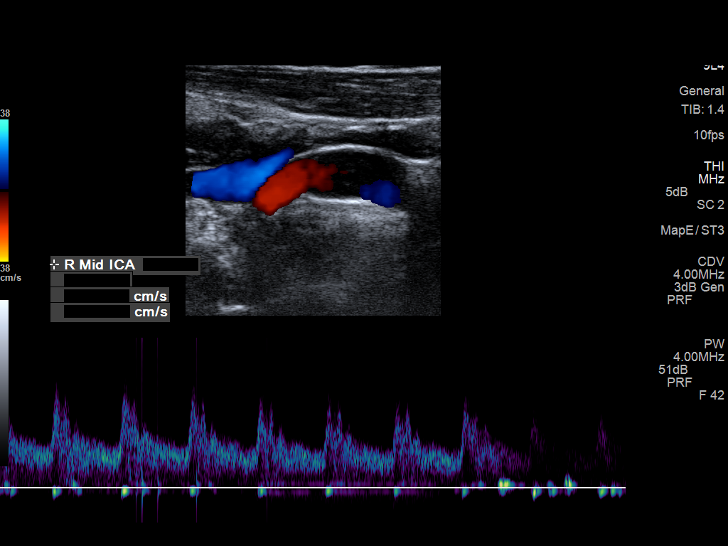
[im 33/64]
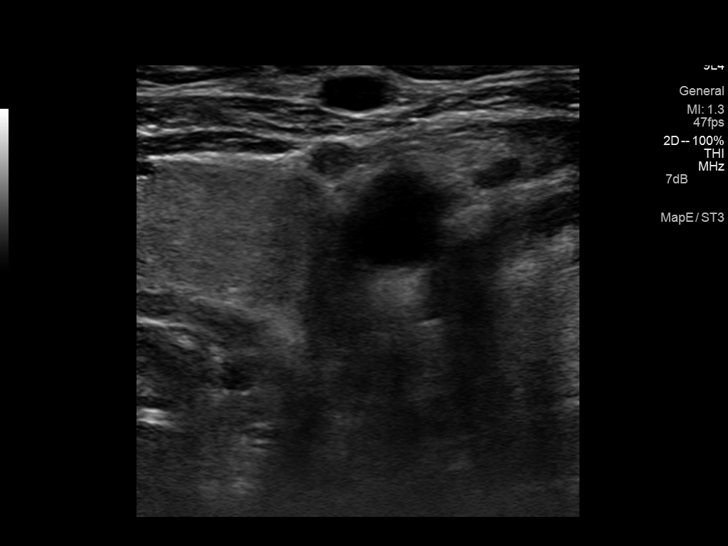
[im 36/64]
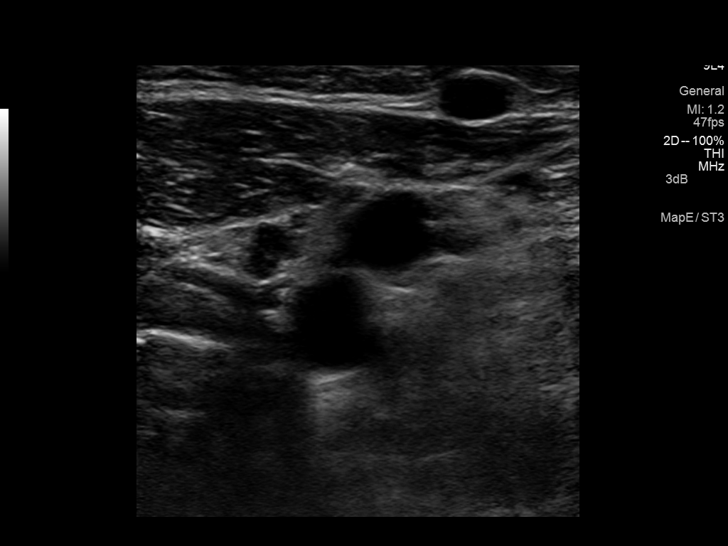
[im 42/64]
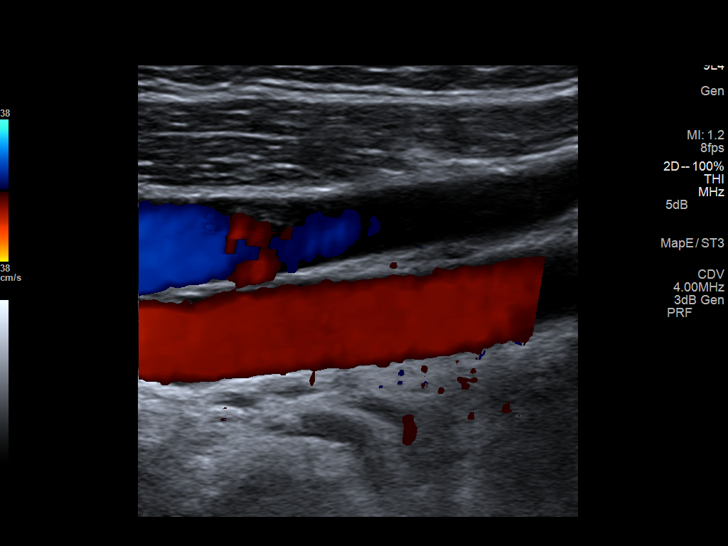
[im 47/64]
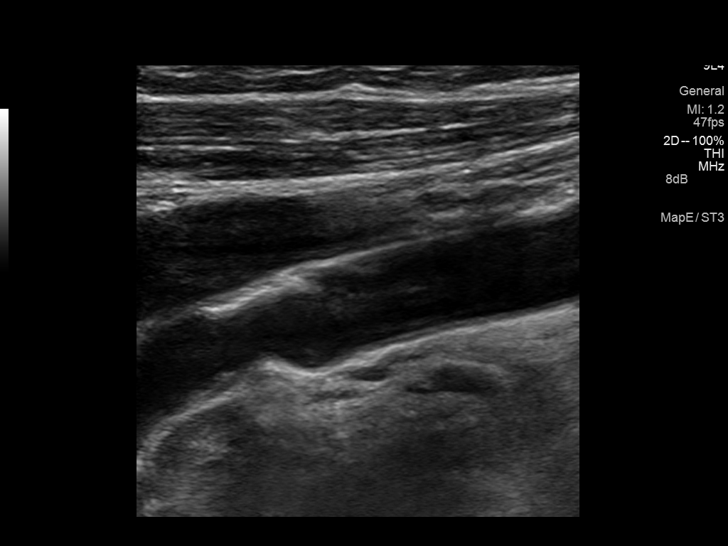
[im 53/64]
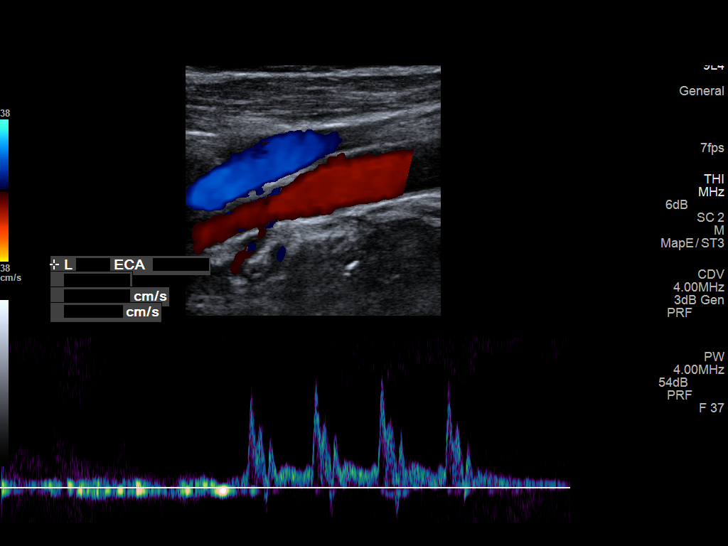
[im 58/64]
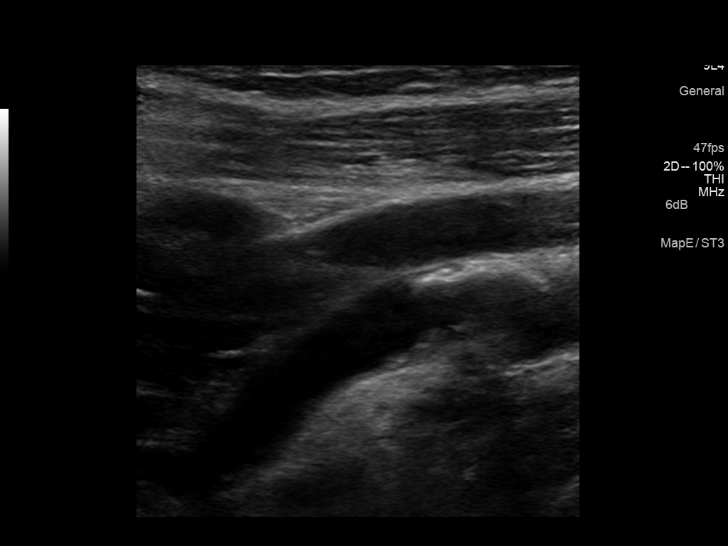
[im 64/64]
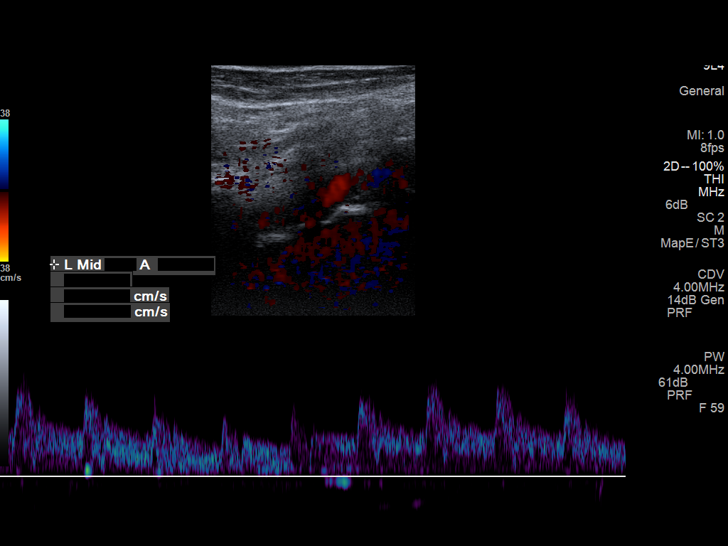

[13 of 24 positions shown; findings below may reference images not displayed]

FINDINGS: Criteria: Quantification of carotid stenosis is based on velocity
parameters that correlate the residual internal carotid diameter
with NASCET-based stenosis levels, using the diameter of the distal
internal carotid lumen as the denominator for stenosis measurement.

The following velocity measurements were obtained:

RIGHT

ICA:  61/26 cm/sec

CCA:  98/16 cm/sec

SYSTOLIC ICA/CCA RATIO:

DIASTOLIC ICA/CCA RATIO:

ECA:  57 cm/sec

LEFT

ICA:  78/32 cm/sec

CCA:  88/19 cm/sec

SYSTOLIC ICA/CCA RATIO:

DIASTOLIC ICA/CCA RATIO:

ECA:  57 cm/sec

RIGHT CAROTID ARTERY: There is mild tortuosity of the right common
carotid artery. There is a minimal amount of eccentric mixed
echogenic plaque within the right carotid bulb (image 14 and 18),
extending to involve the origin and proximal aspects of the right
internal carotid artery (image 24), not resulting in elevated peak
systolic velocities within the interrogated course the right
internal carotid artery to suggest a hemodynamically significant
stenosis.

RIGHT VERTEBRAL ARTERY:  Antegrade flow

LEFT CAROTID ARTERY: There is a minimal amount of eccentric mixed
echogenic plaque within the left carotid bulb (images 49), extending
to involve the origin and proximal aspects of the left internal
carotid artery (images 59 and 60), not resulting in elevated peak
systolic velocities within the interrogated course of the left
internal carotid artery to suggest a hemodynamically significant
stenosis.

LEFT VERTEBRAL ARTERY:  Antegrade flow
IMPRESSION: Minimal amount of bilateral atherosclerotic plaque, not resulting in
a hemodynamically significant stenosis.

## 2016-06-08 IMAGING — MR MRI CERVICAL SPINE WITHOUT CONTRAST
6 series · 43 of 48 positions shown · non-contrast
Comparison: Brain MRI/ MRA/ MRV 06/24/2014.

CLINICAL DATA: 53-year-old female with right upper extremity
weakness, headaches. Initial encounter. End-stage *SCRATCH* the
current history of end-stage renal disease.

EXAM:
MRI CERVICAL SPINE WITHOUT CONTRAST
TECHNIQUE: Multiplanar, multisequence MR imaging of the cervical spine was
performed. No intravenous contrast was administered.

[Series 6: T2 · sagittal · 3.0mm · 0.56mm/px · 6 of 15 slices shown (1 of 2)]
[im 1/15]
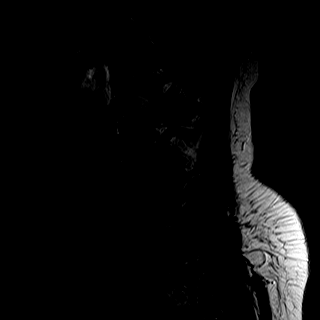
[im 3/15]
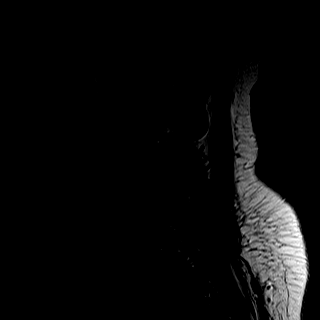
[im 6/15]
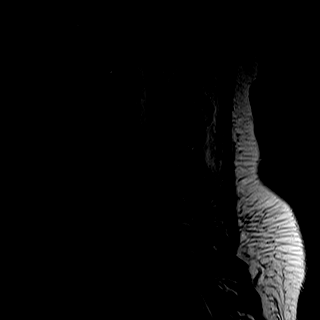
[im 9/15]
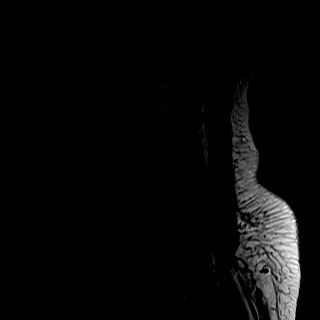
[im 12/15]
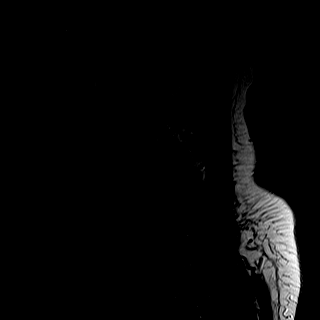
[im 15/15]
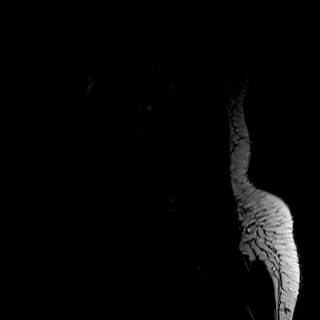

[Series 8: T1 · sagittal · 3.0mm · 0.70mm/px · 6 of 15 slices shown]
[im 1/15]
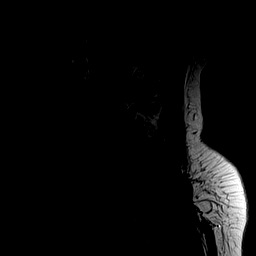
[im 3/15]
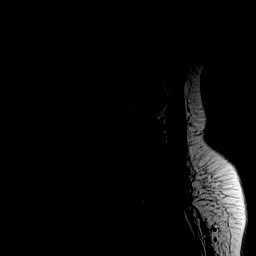
[im 6/15]
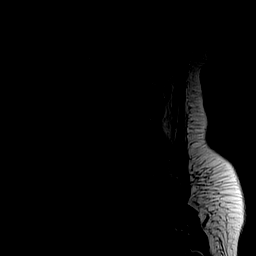
[im 9/15]
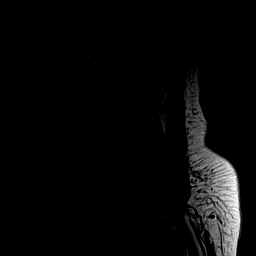
[im 12/15]
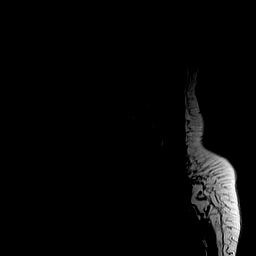
[im 15/15]
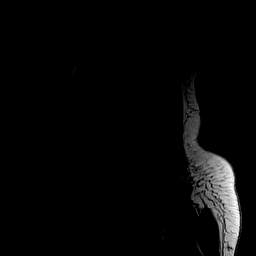

[Series 10: STIR · sagittal · 3.0mm · 0.70mm/px · 7 of 15 slices shown (1 of 2)]
[im 1/15]
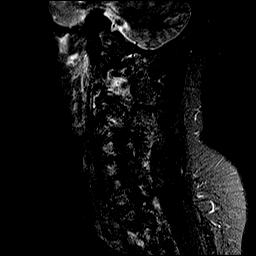
[im 3/15]
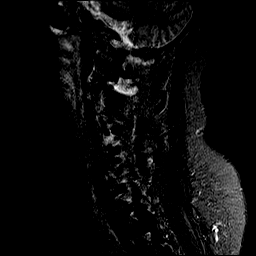
[im 5/15]
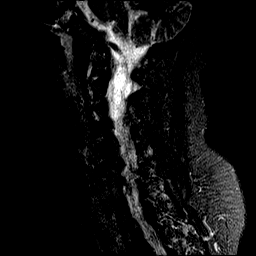
[im 8/15]
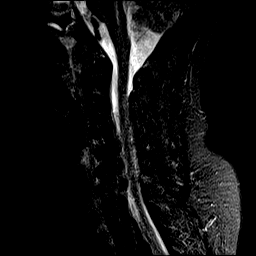
[im 10/15]
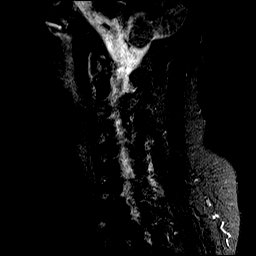
[im 12/15]
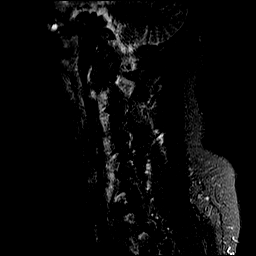
[im 15/15]
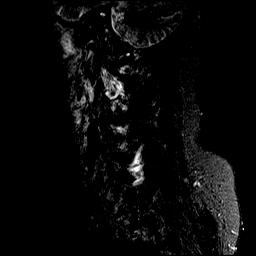

[Series 12: T2 · axial · 3.0mm · 0.70mm/px · z∈[-29,+58]mm · 11 of 24 slices shown (2 of 2)]
[im 1/24]
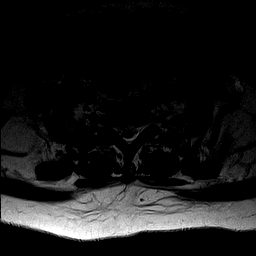
[im 3/24]
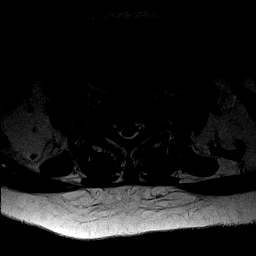
[im 5/24]
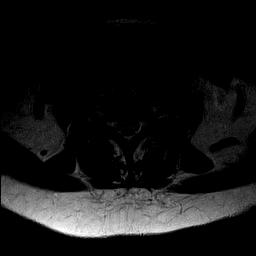
[im 7/24]
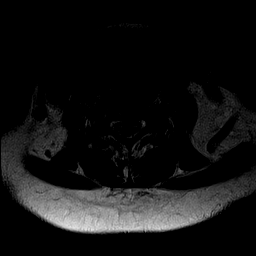
[im 10/24]
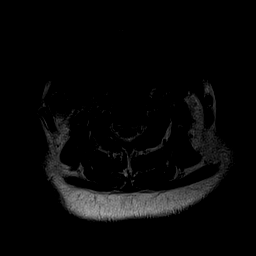
[im 12/24]
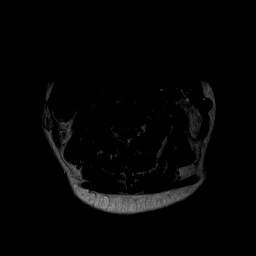
[im 14/24]
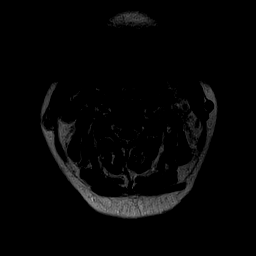
[im 17/24]
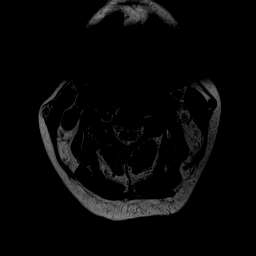
[im 19/24]
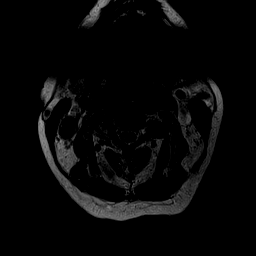
[im 21/24]
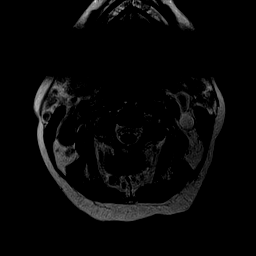
[im 24/24]
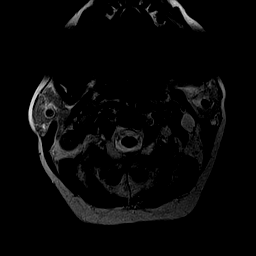

[Series 15: STIR · sagittal · 3.0mm · 0.70mm/px · 7 of 15 slices shown (2 of 2)]
[im 1/15]
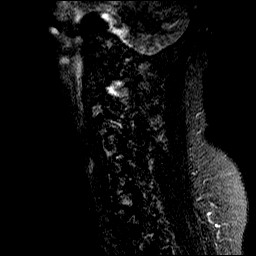
[im 3/15]
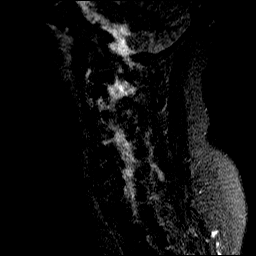
[im 5/15]
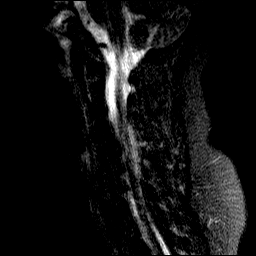
[im 8/15]
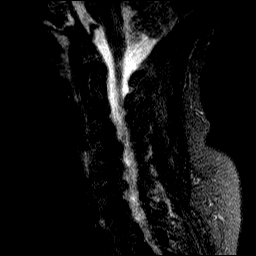
[im 10/15]
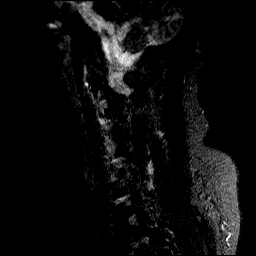
[im 12/15]
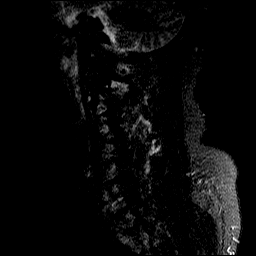
[im 15/15]
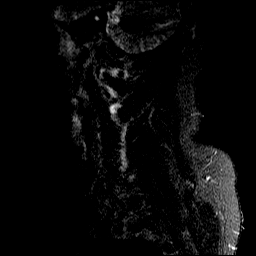

[Series 16: mpgr ax repeat · axial · 3.0mm · 0.35mm/px · z∈[-29,+31]mm · 6 of 24 slices shown]
[im 1/24]
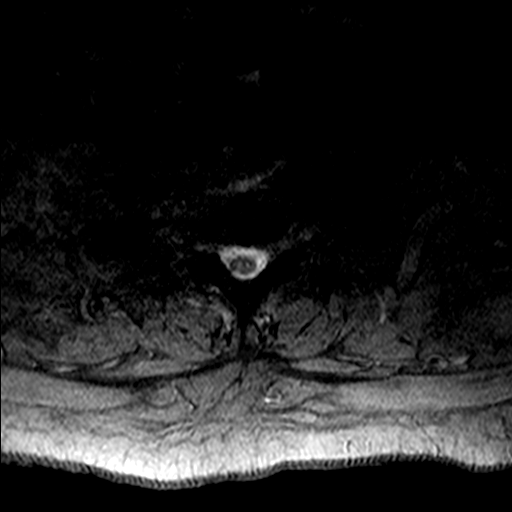
[im 5/24]
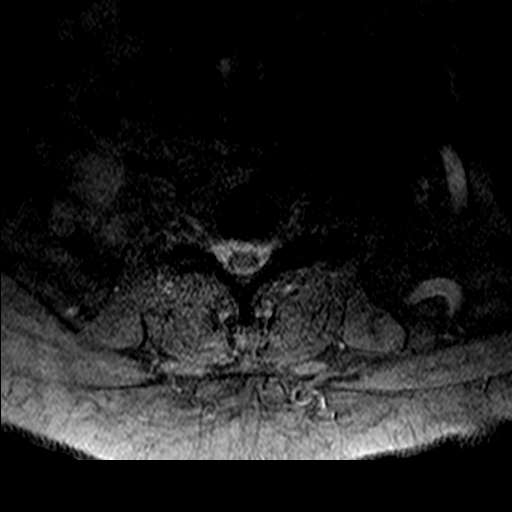
[im 7/24]
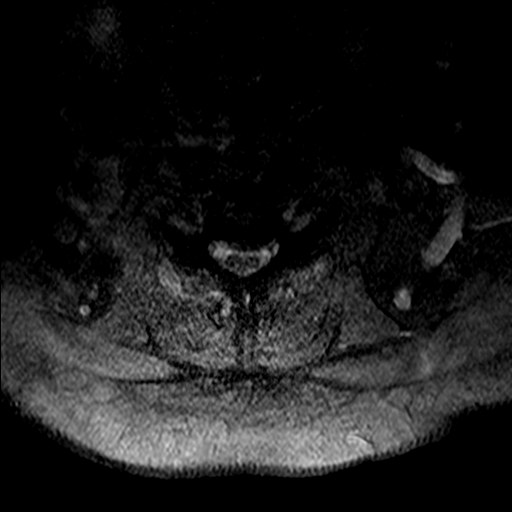
[im 10/24]
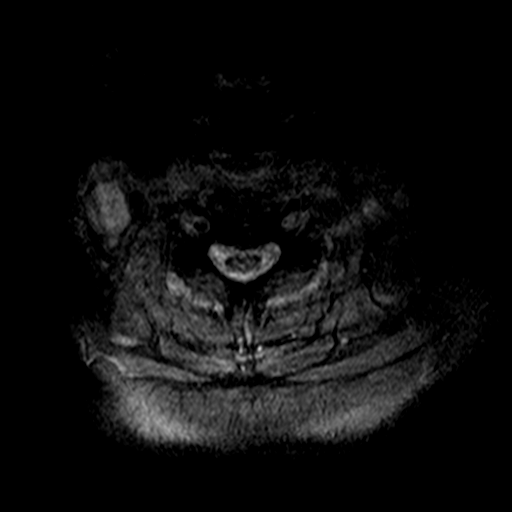
[im 14/24]
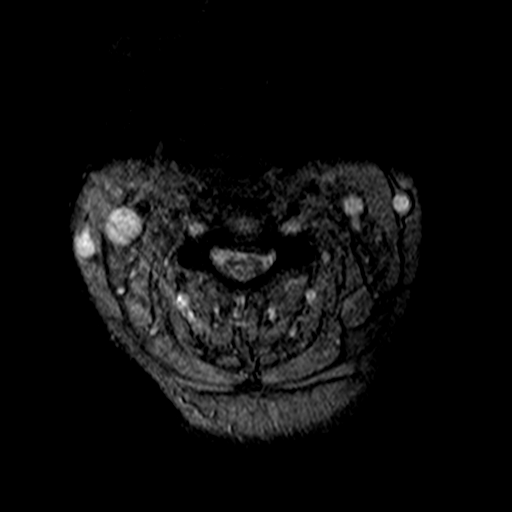
[im 17/24]
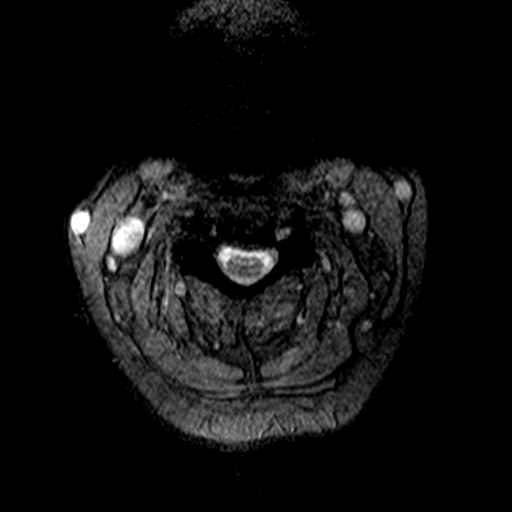

[43 of 48 positions shown; findings below may reference images not displayed]

FINDINGS: Study is intermittently degraded by motion artifact despite repeated
imaging attempts.

Straightening and mild reversal of cervical lordosis plus multilevel
mild spondylolisthesis. There is 2 mm of anterolisthesis at C3-C4.
There is 2-3 mm of retrolisthesis at C6-C7. Associated chronic
degenerative endplate marrow changes. Underlying mildly abnormal T1
bone marrow signal diffusely, probably sequelae of chronic renal
disease.

Mild sphenoid sinus mucosal thickening visible at the skullbase.
Cervicomedullary junction is within normal limits. Spinal cord
signal is within normal limits at all visualized levels.

Negative paraspinal soft tissues.

C2-C3: Moderate bilateral facet hypertrophy. Uncovertebral
hypertrophy. No spinal stenosis. Mild to moderate left C3 foraminal
stenosis.

C3-C4: Moderate facet hypertrophy greater on the right. Mild disc
osteophyte complex. No spinal stenosis. Moderate right C4 foraminal
stenosis.

C4-C5: Left eccentric circumferential disc osteophyte complex. Mild
facet hypertrophy greater on the left. No spinal stenosis. Moderate
to severe left C5 foraminal stenosis.

C5-C6: Circumferential disc osteophyte complex. Right greater than
left uncovertebral hypertrophy. Mild facet hypertrophy. Moderate to
severe right C6 foraminal stenosis circumferential disc osteophyte
complex.

C6-C7: Borderline to mild spinal stenosis. Moderate to severe
bilateral C7 foraminal stenosis.

C7-T1:  Mild facet hypertrophy, otherwise negative.

No upper thoracic spinal stenosis.
IMPRESSION: 1. No acute osseous abnormality identified in the cervical spine.
Multilevel cervical spondylolisthesis associated with disc,
endplate, and facet degeneration.
2. Multifactorial moderate or severe neural foraminal stenosis at
the left C3, right C4, bilateral C5, right C6, and bilateral C7
nerve levels.
3. Borderline to mild multifactorial spinal stenosis at C6-C7. No
cervical spinal cord mass effect or signal abnormality.

## 2016-06-11 ENCOUNTER — Ambulatory Visit
Admission: RE | Admit: 2016-06-11 | Discharge: 2016-06-11 | Disposition: A | Discharge status: Home / Self Care | Payer: Medicare Other | Source: Ambulatory Visit | Attending: Nephrology | Admitting: Nephrology

## 2016-06-11 ENCOUNTER — Other Ambulatory Visit: Payer: Self-pay | Admitting: Nephrology

## 2016-06-11 DIAGNOSIS — R0781 Pleurodynia: Secondary | ICD-10-CM | POA: Diagnosis present

## 2016-06-11 DIAGNOSIS — Z951 Presence of aortocoronary bypass graft: Secondary | ICD-10-CM | POA: Diagnosis not present

## 2016-06-11 DIAGNOSIS — R52 Pain, unspecified: Secondary | ICD-10-CM

## 2016-06-11 DIAGNOSIS — I2510 Atherosclerotic heart disease of native coronary artery without angina pectoris: Secondary | ICD-10-CM | POA: Diagnosis not present

## 2016-06-11 DIAGNOSIS — I517 Cardiomegaly: Secondary | ICD-10-CM | POA: Insufficient documentation

## 2016-06-20 IMAGING — CR DG CHEST 2V
1 series · 2 of 2 positions shown · non-contrast
Comparison: Chest radiograph performed 06/23/2014

CLINICAL DATA: Acute onset of generalized body aches. Initial
encounter.

EXAM:
CHEST  2 VIEW

[Series 1: dxr chest pa (or ap) and lateral · 0.14mm/px · 2 of 2 slices shown]
[im 1/2]
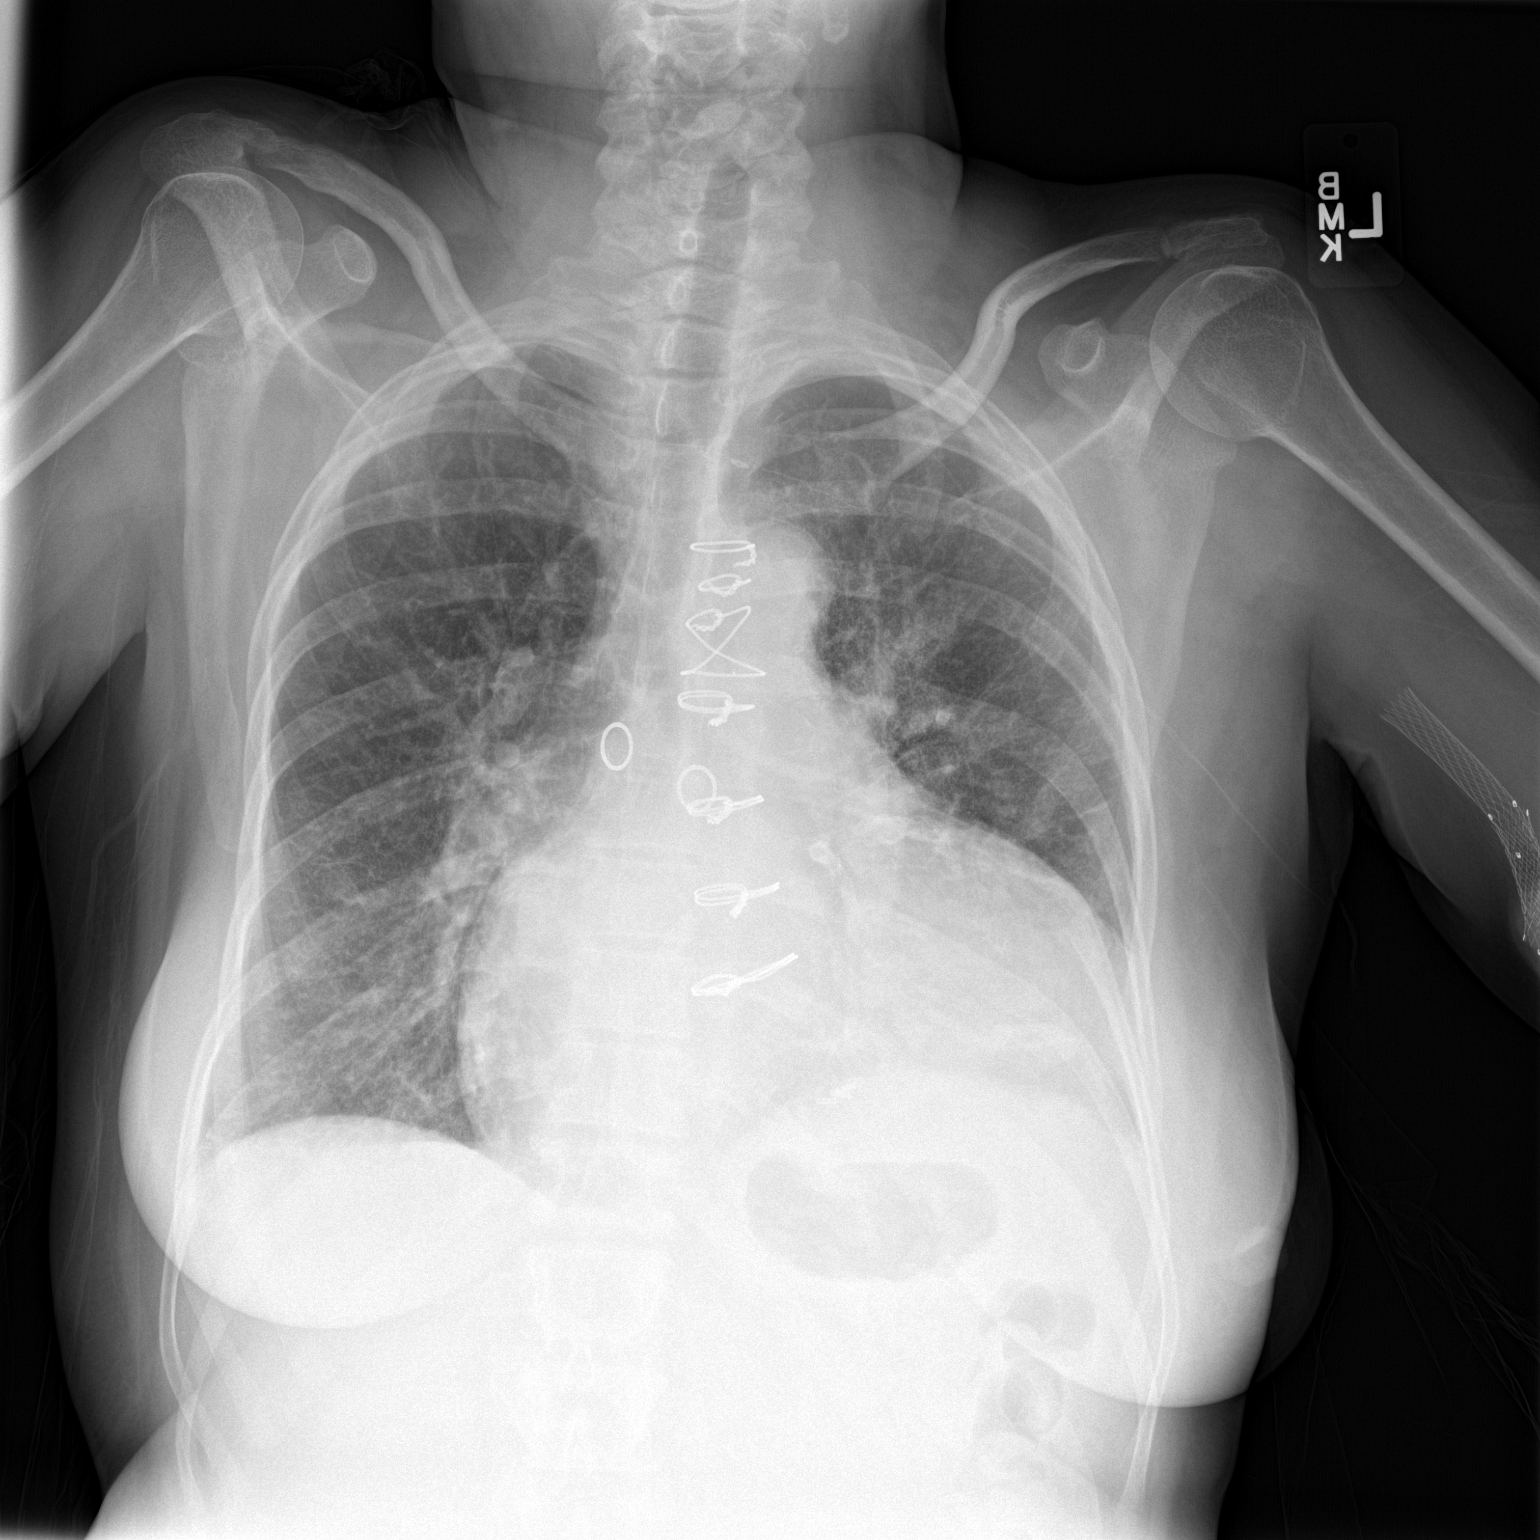
[im 2/2]
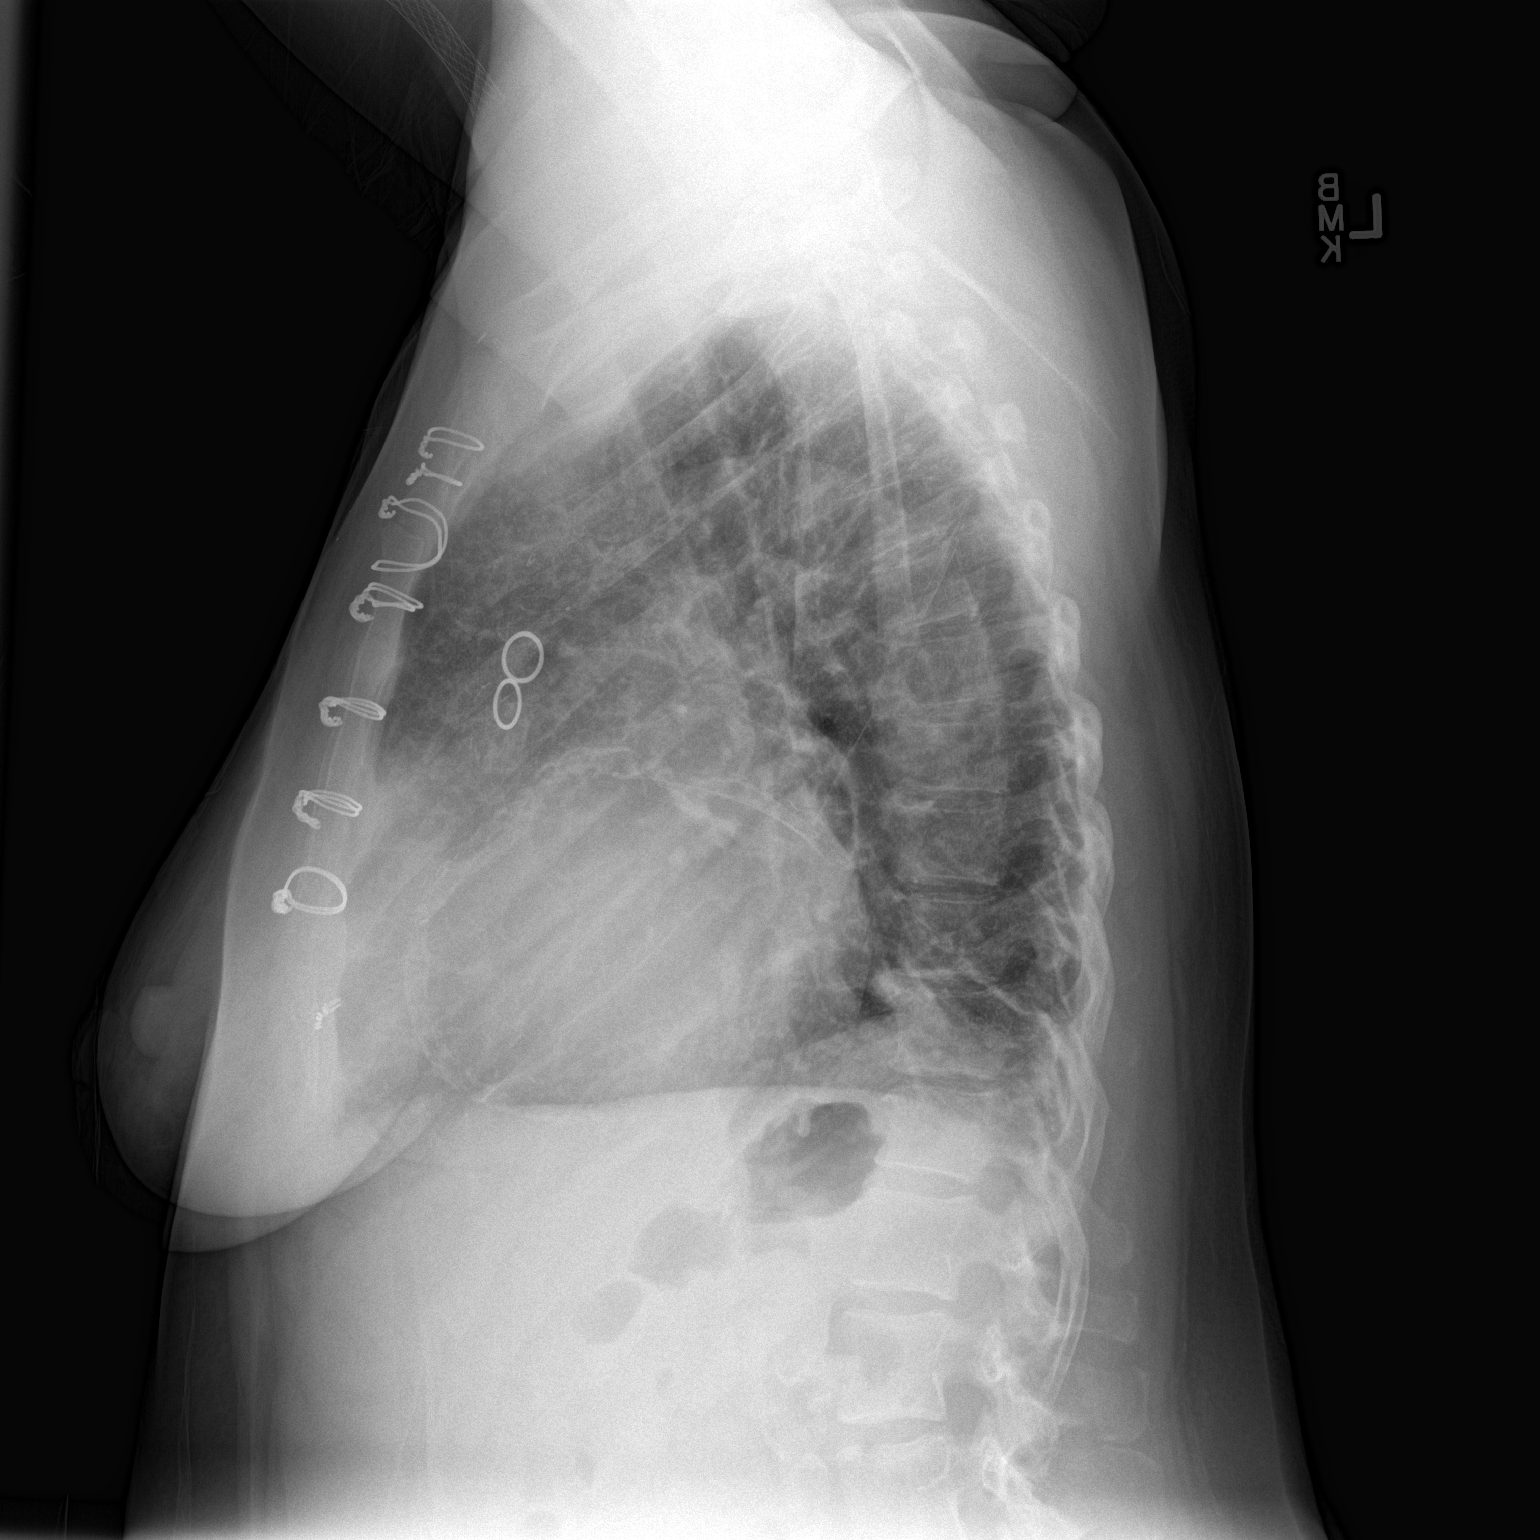

[2 of 2 positions shown; findings below may reference images not displayed]

FINDINGS: The lungs are well-aerated. Vascular congestion is noted, with
slightly increased interstitial markings, which could reflect
minimal interstitial edema, depending on the patient's symptoms.
There is no evidence of pleural effusion or pneumothorax.

The heart is enlarged, as on the prior study. The patient is status
post median sternotomy, with evidence of prior CABG. No acute
osseous abnormalities are seen. A vascular stent is noted within the
left arm.
IMPRESSION: Vascular congestion and cardiomegaly, with slightly increased
interstitial markings, which could reflect minimal interstitial
edema, depending on the patient's symptoms. These are more prominent
than on the prior study.

## 2016-07-16 IMAGING — CR DG CHEST 1V PORT
1 series · 1 of 1 positions shown · non-contrast
Comparison: PA and lateral chest 07/07/2014 and 06/23/2014.

CLINICAL DATA: Headache, fever, vomiting and shortness of breath
beginning tonight.

EXAM:
PORTABLE CHEST - 1 VIEW

[ap]
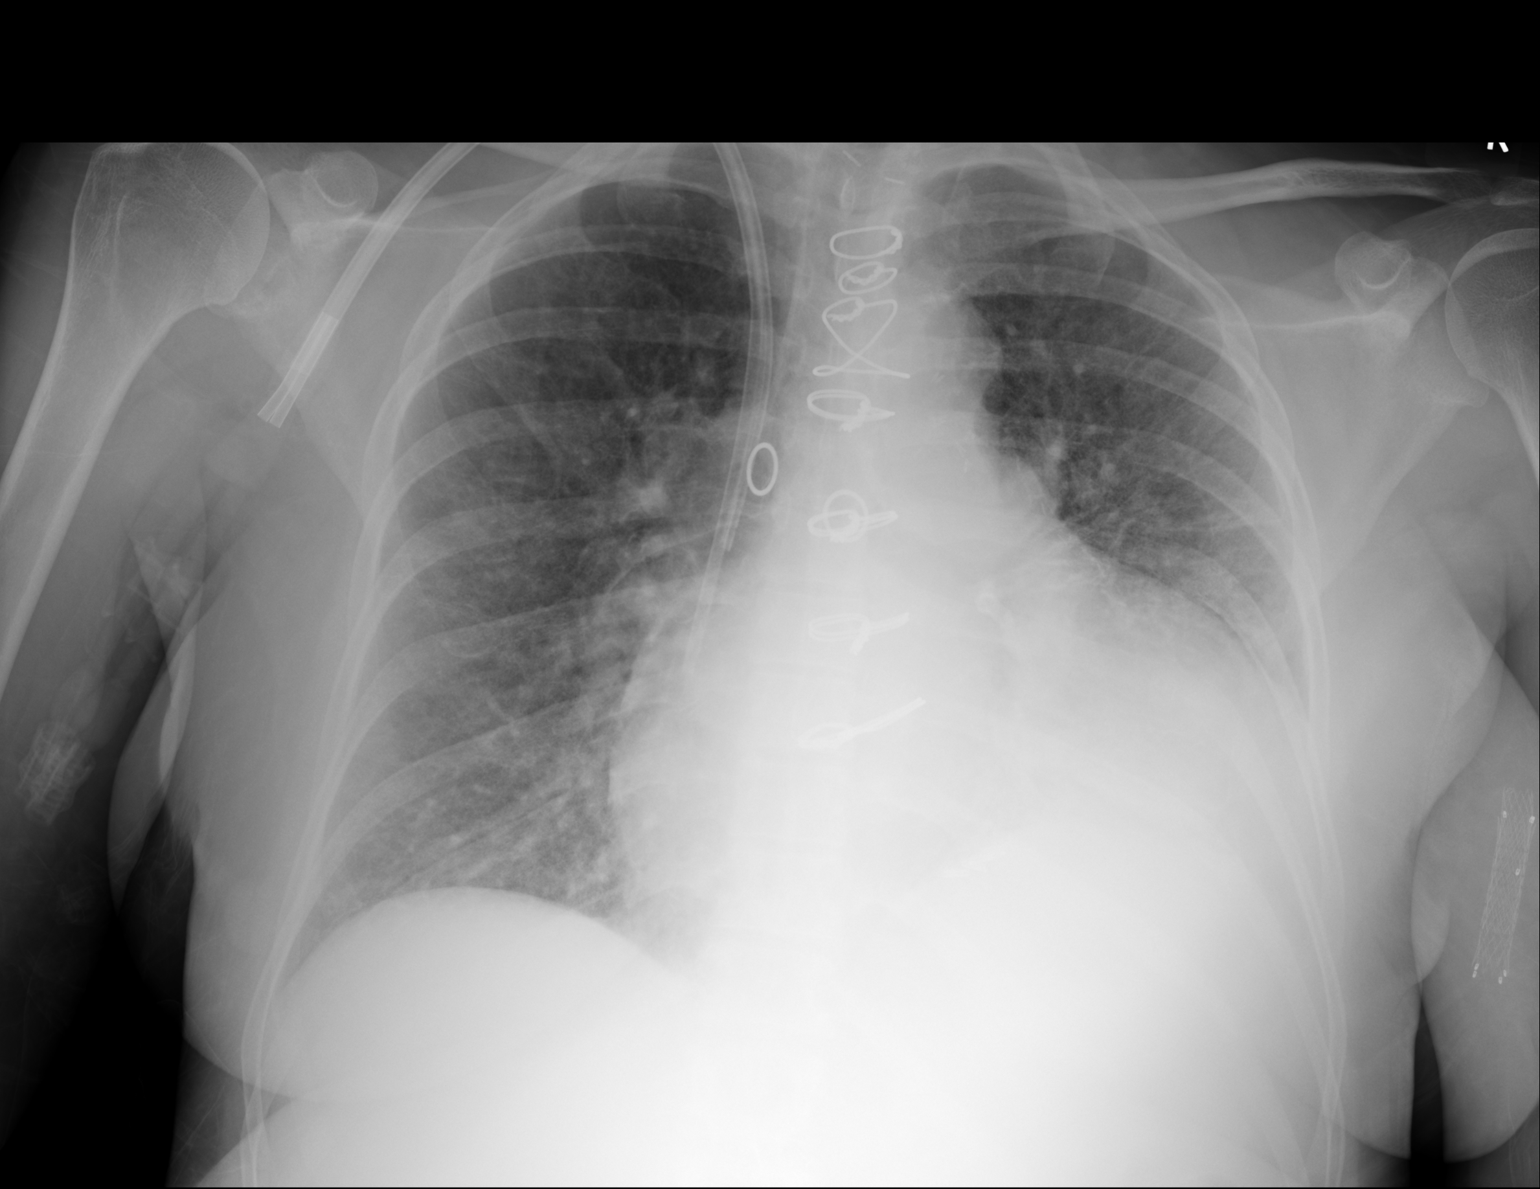

[1 of 1 positions shown; findings below may reference images not displayed]

FINDINGS: Dialysis catheter is again seen. Marked enlargement of the
cardiopericardial silhouette is identified and there is pulmonary
vascular congestion. There is increased density in the left lung
base. The lungs are otherwise unremarkable. No pneumothorax
identified.
IMPRESSION: Increased left basilar density could be due to atelectasis or
pneumonia.

Cardiomegaly and pulmonary vascular congestion.

## 2016-09-22 ENCOUNTER — Ambulatory Visit (INDEPENDENT_AMBULATORY_CARE_PROVIDER_SITE_OTHER): Payer: Medicare Other | Admitting: Vascular Surgery

## 2016-09-22 ENCOUNTER — Ambulatory Visit (INDEPENDENT_AMBULATORY_CARE_PROVIDER_SITE_OTHER): Payer: Medicare Other

## 2016-09-22 ENCOUNTER — Encounter (INDEPENDENT_AMBULATORY_CARE_PROVIDER_SITE_OTHER): Payer: Self-pay | Admitting: Vascular Surgery

## 2016-09-22 VITALS — BP 127/79 | HR 90 | Resp 16 | Ht 66.0 in | Wt 168.0 lb

## 2016-09-22 DIAGNOSIS — Z992 Dependence on renal dialysis: Secondary | ICD-10-CM

## 2016-09-22 DIAGNOSIS — E785 Hyperlipidemia, unspecified: Secondary | ICD-10-CM | POA: Diagnosis not present

## 2016-09-22 DIAGNOSIS — N186 End stage renal disease: Secondary | ICD-10-CM

## 2016-09-22 DIAGNOSIS — M7989 Other specified soft tissue disorders: Secondary | ICD-10-CM | POA: Diagnosis not present

## 2016-09-22 DIAGNOSIS — I10 Essential (primary) hypertension: Secondary | ICD-10-CM

## 2016-09-22 NOTE — Assessment & Plan Note (Signed)
blood pressure control important in reducing the progression of atherosclerotic disease. On appropriate oral medications.  

## 2016-09-22 NOTE — Assessment & Plan Note (Signed)
Multiple causes discussed. Use of compression stockings, elevation, and increased activity of benefit. Plan venous duplex to assess for venous disease as a possible cause.

## 2016-09-22 NOTE — Progress Notes (Signed)
MRN : [id number]  [name] is a 56 y.o. (03/18/1961) female who presents with chief complaint of  Chief Complaint  Patient presents with  . Re-evaluation    6 month follow up  .  History of Present Illness: Patient returns today in follow up of Her AV fistula. This is required multiple previous interventions including a surgical jump graft but is currently working well. Her duplex today does demonstrate a patent AV fistula and jump graft without areas of stenosis. She no longer has aneurysmal degeneration and the skin is intact. She does have a complaint today of left lower extremity swelling. This is been going on for several weeks and is actually a little bit better today than it was. She says she talked to her cardiologist to could not give her any answer for this.  Current Outpatient Prescriptions  Medication Sig Dispense Refill  . amiodarone (PACERONE) 200 MG tablet Take 200 mg by mouth daily.    Marland Kitchen amitriptyline (ELAVIL) 25 MG tablet Take 25 mg by mouth at bedtime.    Marland Kitchen amLODipine (NORVASC) 10 MG tablet     . aspirin 81 MG tablet Take 81 mg by mouth daily.    . cyclobenzaprine (FLEXERIL) 10 MG tablet Take 1 tablet (10 mg total) by mouth 3 (three) times daily as needed for muscle spasms. 21 tablet 0  . docusate sodium (COLACE) 100 MG capsule Take 100 mg by mouth 2 (two) times daily.    Marland Kitchen ELIQUIS 5 MG TABS tablet Take 5 mg by mouth 2 (two) times daily.     . fluconazole (DIFLUCAN) 150 MG tablet     . HYDROcodone-acetaminophen (NORCO/VICODIN) 5-325 MG tablet Take 1 tablet by mouth every 4 (four) hours as needed for moderate pain. 6 tablet 0  . hydrOXYzine (ATARAX/VISTARIL) 25 MG tablet Take 1 tablet by mouth 2 (two) times daily as needed.     . hydrOXYzine (ATARAX/VISTARIL) 50 MG tablet Take 50 mg by mouth once.    Marland Kitchen ibuprofen (ADVIL,MOTRIN) 600 MG tablet Take 1 tablet (600 mg total) by mouth every 8 (eight) hours as needed. 20 tablet 0  . midodrine (PROAMATINE) 10 MG tablet  Take 10 mg by mouth 1 day or 1 dose.    . ondansetron (ZOFRAN ODT) 4 MG disintegrating tablet Allow 1-2 tablets to dissolve in your mouth every 8 hours as needed for nausea/vomiting 30 tablet 0  . pantoprazole (PROTONIX) 40 MG tablet Take 1 tablet by mouth daily.    . sennosides-docusate sodium (SENOKOT-S) 8.6-50 MG tablet Take 2 tablets by mouth daily.    . SENSIPAR 30 MG tablet Take 1 tablet by mouth daily.    . sevelamer carbonate (RENVELA) 800 MG tablet Take 2,400 mg by mouth 3 (three) times daily with meals.     . warfarin (COUMADIN) 2.5 MG tablet Take 2.5 mg by mouth daily.     No current facility-administered medications for this visit.     Past Medical History:  Diagnosis Date  . Heart failure ([place])   . Hyperlipidemia   . Hypertension   . Myocardial infarction   . Renal failure   . Stroke [facility]) 2011    Past Surgical History:  Procedure Laterality Date  . CORONARY ANGIOPLASTY WITH STENT PLACEMENT  2013  . DIALYSIS FISTULA CREATION      Social History Social History  Substance Use Topics  . Smoking status: Current Every Day Smoker    Packs/day: 0.50    Years: 20.00  Types: Cigarettes  . Smokeless tobacco: Never Used  . Alcohol use No     Family History Family History  Problem Relation Age of Onset  . Hypertension    . Cancer    . Renal Disease       Allergies  Allergen Reactions  . Morphine And Related Hives  . Adhesive [Tape] Rash  . Latex Rash     REVIEW OF SYSTEMS (Negative unless checked)  Constitutional: [] Weight loss  [] Fever  [] Chills Cardiac: [] Chest pain   [] Chest pressure   [] Palpitations   [] Shortness of breath when laying flat   [] Shortness of breath at rest   [x] Shortness of breath with exertion. Vascular:  [] Pain in legs with walking   [] Pain in legs at rest   [] Pain in legs when laying flat   [] Claudication   [] Pain in feet when walking  [] Pain in feet at rest  [] Pain in feet when laying flat   [] History of DVT   [] Phlebitis    [x] Swelling in legs   [] Varicose veins   [] Non-healing ulcers Pulmonary:   [] Uses home oxygen   [] Productive cough   [] Hemoptysis   [] Wheeze  [] COPD   [] Asthma Neurologic:  [] Dizziness  [] Blackouts   [] Seizures   [] History of stroke   [] History of TIA  [] Aphasia   [] Temporary blindness   [] Dysphagia   [] Weakness or numbness in arms   [] Weakness or numbness in legs Musculoskeletal:  [] Arthritis   [] Joint swelling   [] Joint pain   [] Low back pain Hematologic:  [] Easy bruising  [] Easy bleeding   [] Hypercoagulable state   [] Anemic   Gastrointestinal:  [] Blood in stool   [] Vomiting blood  [] Gastroesophageal reflux/heartburn   [] Abdominal pain Genitourinary:  [x] Chronic kidney disease   [] Difficult urination  [] Frequent urination  [] Burning with urination   [] Hematuria Skin:  [] Rashes   [] Ulcers   [] Wounds Psychological:  [] History of anxiety   []  History of major depression.  Physical Examination  BP 127/79 (BP Location: Right Arm)   Pulse 90   Resp 16   Ht 5\' 6"  (1.676 m)   Wt 168 lb (76.2 kg)   BMI 27.12 kg/m  Gen:  WD/WN, NAD Head: [place]/AT, No temporalis wasting. Ear/Nose/Throat: Hearing grossly intact, nares w/o erythema or drainage, trachea midline Eyes: Conjunctiva clear. Sclera non-icteric Neck: Supple.  No JVD.  Pulmonary:  Good air movement, no use of accessory muscles.  Cardiac: RRR, normal S1, S2 Vascular: good thrill in left arm AVF Vessel Right Left  Radial Palpable Palpable                                   Gastrointestinal: soft, non-tender/non-distended. No guarding/reflex.  Musculoskeletal: M/S 5/5 throughout.  No deformity or atrophy. 1-2+ LLE edema. Neurologic: Sensation grossly intact in extremities.  Symmetrical.  Speech is fluent.  Psychiatric: Judgment intact, Mood & affect appropriate for pt's clinical situation. Dermatologic: No rashes or ulcers noted.  No cellulitis or open wounds.       Labs No results found for this or any previous visit (from  the past 2160 hour(s)).  Radiology No results found.    Assessment/Plan  Hyperlipidemia lipid control important in reducing the progression of atherosclerotic disease. Continue statin therapy   ESRD on hemodialysis Her access is currently working well and her duplex is patent without significant stenosis. Plan to recheck this in 6 months as it has required multiple previous interventions in  the past.  HTN (hypertension) blood pressure control important in reducing the progression of atherosclerotic disease. On appropriate oral medications.   Swelling of limb Multiple causes discussed. Use of compression stockings, elevation, and increased activity of benefit. Plan venous duplex to assess for venous disease as a possible cause.    Leotis Pain, MD  09/22/2016 10:26 AM    This note was created with Dragon medical transcription system.  Any errors from dictation are purely unintentional

## 2016-09-22 NOTE — Assessment & Plan Note (Signed)
Her access is currently working well and her duplex is patent without significant stenosis. Plan to recheck this in 6 months as it has required multiple previous interventions in the past.

## 2016-09-22 NOTE — Assessment & Plan Note (Signed)
lipid control important in reducing the progression of atherosclerotic disease. Continue statin therapy  

## 2016-11-17 ENCOUNTER — Encounter (INDEPENDENT_AMBULATORY_CARE_PROVIDER_SITE_OTHER): Payer: Medicare Other

## 2016-11-17 ENCOUNTER — Ambulatory Visit (INDEPENDENT_AMBULATORY_CARE_PROVIDER_SITE_OTHER): Payer: Medicare Other | Admitting: Vascular Surgery
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
## 2017-03-17 ENCOUNTER — Other Ambulatory Visit: Payer: Self-pay | Admitting: Internal Medicine

## 2017-03-17 DIAGNOSIS — R748 Abnormal levels of other serum enzymes: Secondary | ICD-10-CM

## 2017-03-23 ENCOUNTER — Ambulatory Visit (INDEPENDENT_AMBULATORY_CARE_PROVIDER_SITE_OTHER): Payer: Medicare Other | Admitting: Vascular Surgery

## 2017-03-23 ENCOUNTER — Encounter (INDEPENDENT_AMBULATORY_CARE_PROVIDER_SITE_OTHER): Payer: Medicare Other

## 2017-03-30 ENCOUNTER — Ambulatory Visit
Admission: RE | Admit: 2017-03-30 | Discharge: 2017-03-30 | Disposition: A | Discharge status: Home / Self Care | Payer: Medicare Other | Source: Ambulatory Visit | Attending: Internal Medicine | Admitting: Internal Medicine

## 2017-03-30 DIAGNOSIS — R748 Abnormal levels of other serum enzymes: Secondary | ICD-10-CM | POA: Diagnosis present

## 2017-03-30 DIAGNOSIS — K8020 Calculus of gallbladder without cholecystitis without obstruction: Secondary | ICD-10-CM | POA: Diagnosis not present

## 2017-03-30 DIAGNOSIS — K769 Liver disease, unspecified: Secondary | ICD-10-CM | POA: Diagnosis not present

## 2017-03-30 DIAGNOSIS — R110 Nausea: Secondary | ICD-10-CM | POA: Diagnosis present

## 2017-03-30 DIAGNOSIS — R5383 Other fatigue: Secondary | ICD-10-CM | POA: Diagnosis present

## 2017-04-23 ENCOUNTER — Ambulatory Visit (INDEPENDENT_AMBULATORY_CARE_PROVIDER_SITE_OTHER): Payer: Medicare Other | Admitting: Vascular Surgery

## 2017-04-23 ENCOUNTER — Encounter (INDEPENDENT_AMBULATORY_CARE_PROVIDER_SITE_OTHER): Payer: Medicare Other

## 2017-05-10 ENCOUNTER — Other Ambulatory Visit: Payer: Self-pay

## 2017-05-11 ENCOUNTER — Ambulatory Visit (INDEPENDENT_AMBULATORY_CARE_PROVIDER_SITE_OTHER): Payer: Medicare Other | Admitting: Gastroenterology

## 2017-05-11 ENCOUNTER — Other Ambulatory Visit: Payer: Self-pay

## 2017-05-11 ENCOUNTER — Encounter (INDEPENDENT_AMBULATORY_CARE_PROVIDER_SITE_OTHER): Payer: Self-pay

## 2017-05-11 DIAGNOSIS — Z1211 Encounter for screening for malignant neoplasm of colon: Secondary | ICD-10-CM

## 2017-05-11 DIAGNOSIS — B182 Chronic viral hepatitis C: Secondary | ICD-10-CM

## 2017-05-11 DIAGNOSIS — K7469 Other cirrhosis of liver: Secondary | ICD-10-CM

## 2017-05-11 NOTE — Progress Notes (Signed)
Cephas Darby, MD [address]  [place]  [place], [place] [address]  Main: [phone]  Fax: [phone]    Gastroenterology Consultation  Referring Provider:     Marden Noble, MD Primary Care Physician:  Marden Noble, MD Primary Gastroenterologist:  Dr. Cephas Darby Reason for Consultation:     Chronic Hep C, cirrhosis        HPI:   [name] is a 55 y.o. female referred by Dr. Marden Noble, MD  for consultation & management of Chronic Hep C. Shge has h/o CAD s/p CABG in 2015, ESRD, A Fib on eliquis with chronic Hep C and abnormal LFTs in 2017. She is found to have Hep C in 2015 at [facility]. Her LFTs are elevated in 03/2016 no f/u labs since then. She had CT in 03/2016 which revealed nodularity of liver. She does have new onset of thrombocytopenia. She reports gaining weight and has not been watching her diet, slipped out of healthy diet. She denies swelling of legs. She does have intermittent pruritus. She denies any other GI symptoms. She did not have screening colonoscopy yet due to her heart condition at age 81. She is doing well from cardiac stand point. She denies CP, SOB. Compliant with her meds.  She quit smoking No ETOH NSAIDs: none  Antiplts/Anticoagulants/Anti thrombotics: eliquis for A Fib  GI Procedures: none  Past Medical History:  Diagnosis Date  . Heart failure ([place])   . Hyperlipidemia   . Hypertension   . Myocardial infarction ([facility])   . Renal failure   . Stroke [facility]) 2011    Past Surgical History:  Procedure Laterality Date  . CORONARY ANGIOPLASTY WITH STENT PLACEMENT  2013  . DIALYSIS FISTULA CREATION       Current Outpatient Medications:  .  amiodarone (PACERONE) 200 MG tablet, Take 200 mg by mouth daily., Disp: , Rfl:  .  amitriptyline (ELAVIL) 25 MG tablet, Take 25 mg by mouth at bedtime., Disp: , Rfl:  .  amLODipine (NORVASC) 10 MG tablet, , Disp: , Rfl:  .  aspirin 81 MG tablet, Take 81 mg by mouth daily., Disp: ,  Rfl:  .  cyclobenzaprine (FLEXERIL) 10 MG tablet, Take 1 tablet (10 mg total) by mouth 3 (three) times daily as needed for muscle spasms., Disp: 21 tablet, Rfl: 0 .  docusate sodium (COLACE) 100 MG capsule, Take 100 mg by mouth 2 (two) times daily., Disp: , Rfl:  .  ELIQUIS 5 MG TABS tablet, Take 5 mg by mouth 2 (two) times daily. , Disp: , Rfl:  .  fluconazole (DIFLUCAN) 150 MG tablet, , Disp: , Rfl:  .  hydrALAZINE (APRESOLINE) 50 MG tablet, hydralazine 50 mg tablet, Disp: , Rfl:  .  HYDROcodone-acetaminophen (NORCO/VICODIN) 5-325 MG tablet, Take 1 tablet by mouth every 4 (four) hours as needed for moderate pain., Disp: 6 tablet, Rfl: 0 .  hydrOXYzine (ATARAX/VISTARIL) 25 MG tablet, Take 1 tablet by mouth 2 (two) times daily as needed. , Disp: , Rfl:  .  hydrOXYzine (ATARAX/VISTARIL) 50 MG tablet, Take 50 mg by mouth once., Disp: , Rfl:  .  ibuprofen (ADVIL,MOTRIN) 600 MG tablet, Take 1 tablet (600 mg total) by mouth every 8 (eight) hours as needed., Disp: 20 tablet, Rfl: 0 .  midodrine (PROAMATINE) 10 MG tablet, Take 10 mg by mouth 1 day or 1 dose., Disp: , Rfl:  .  ondansetron (ZOFRAN ODT) 4 MG disintegrating tablet, Allow 1-2 tablets to  dissolve in your mouth every 8 hours as needed for nausea/vomiting, Disp: 30 tablet, Rfl: 0 .  pantoprazole (PROTONIX) 40 MG tablet, Take 1 tablet by mouth daily., Disp: , Rfl:  .  sennosides-docusate sodium (SENOKOT-S) 8.6-50 MG tablet, Take 2 tablets by mouth daily., Disp: , Rfl:  .  SENSIPAR 30 MG tablet, Take 1 tablet by mouth daily., Disp: , Rfl:  .  sevelamer carbonate (RENVELA) 800 MG tablet, Take 2,400 mg by mouth 3 (three) times daily with meals. , Disp: , Rfl:    Family History  Problem Relation Age of Onset  . Hypertension Unknown   . Cancer Unknown   . Renal Disease Unknown      Social History   Tobacco Use  . Smoking status: Current Every Day Smoker    Packs/day: 0.50    Years: 20.00    Pack years: 10.00    Types: Cigarettes  .  Smokeless tobacco: Never Used  Substance Use Topics  . Alcohol use: No  . Drug use: No    Allergies as of 05/11/2017 - Review Complete 09/22/2016  Allergen Reaction Noted  . Morphine and related Hives 05/29/2013  . Latex Rash 05/29/2013  . Tape Rash 12/04/2013    Review of Systems:    All systems reviewed and negative except where noted in HPI.   Physical Exam:  There were no vitals taken for this visit. No LMP recorded. Patient is postmenopausal.  General:   Alert,  Well-developed, well-nourished, pleasant and cooperative in NAD Head:  Normocephalic and atraumatic. Eyes:  Sclera clear, no icterus.   Conjunctiva pink. Ears:  Normal auditory acuity. Nose:  No deformity, discharge, or lesions. Mouth:  No deformity or lesions,oropharynx pink & moist. Neck:  Supple; no masses or thyromegaly. Lungs:  Respirations even and unlabored.  Clear throughout to auscultation.   No wheezes, crackles, or rhonchi. No acute distress. Heart:  Regular rate and rhythm; no murmurs, clicks, rubs, or gallops, median sternotomy. Abdomen:  Normal bowel sounds. Soft, obese, non-tender and non-distended without masses, hepatosplenomegaly or hernias noted.  No guarding or rebound tenderness.   Rectal: Nor performed Msk:  Symmetrical without gross deformities. Good, equal movement & strength bilaterally. Pulses:  Normal pulses noted. Extremities:  No clubbing or edema.  No cyanosis. Neurologic:  Alert and oriented x3;  grossly normal neurologically. Skin:  Intact without significant lesions or rashes. No jaundice. Lymph Nodes:  No significant cervical adenopathy. Psych:  Alert and cooperative. Normal mood and affect.  Imaging Studies:   Assessment and Plan:   [name] is a 56 y.o.black female with CAD s/p CABG, ESRD, obesity, chronic hep C, treatment naive, well compensated Cirrhosis  Cirrhosis: secondary to Hep C, compensated Complete secondary liver disease work up Update labs to  calculate MELD-Na and CPT score Volume status: on HD, Low salt diet Portal HTN: screen for varices, schedule EGD Has thrombocytopenia No PSE HCC screening: No liver lesions on [place] in 03/2080, check AFP Screening colonoscopy Needs Hep A and B vaccine Pneumonia and flu vaccine   Hep C Ab positive: Check viral load and genotype Treat hepC based on above work up  Follow up in 49months   Cephas Darby, MD

## 2017-05-12 ENCOUNTER — Telehealth: Payer: Self-pay

## 2017-05-12 NOTE — Telephone Encounter (Signed)
Patient wishes to schedule colonoscopy and EGD in January.  She needs it scheduled on Tuesdays or Thursdays.  Will schedule in January.  Thanks [employer]

## 2017-06-11 IMAGING — US US ABDOMEN LIMITED
1 series · 14 of 25 positions shown · non-contrast
Comparison: 06/13/2014

CLINICAL DATA: Abdominal pain, generalized.  Symptoms for 1 week.

EXAM:
US ABDOMEN LIMITED - RIGHT UPPER QUADRANT

[Series 1: us abdomen limited · 0.18mm/px · 14 of 51 slices shown]
[im 1/51]
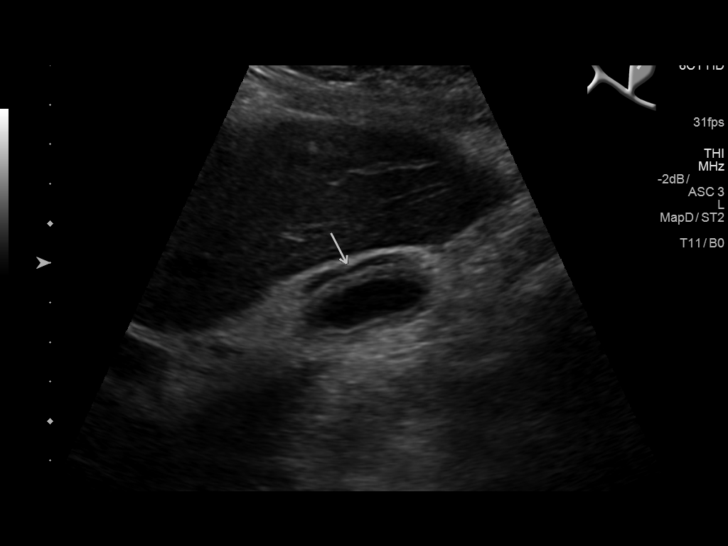
[im 5/51]
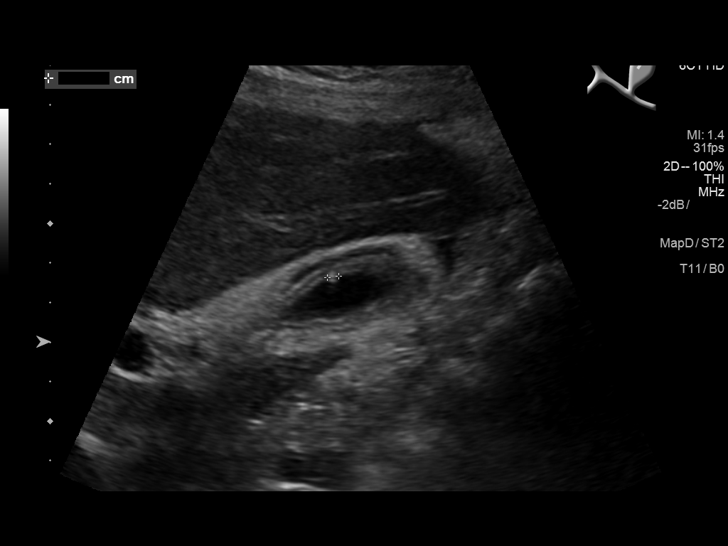
[im 9/51]
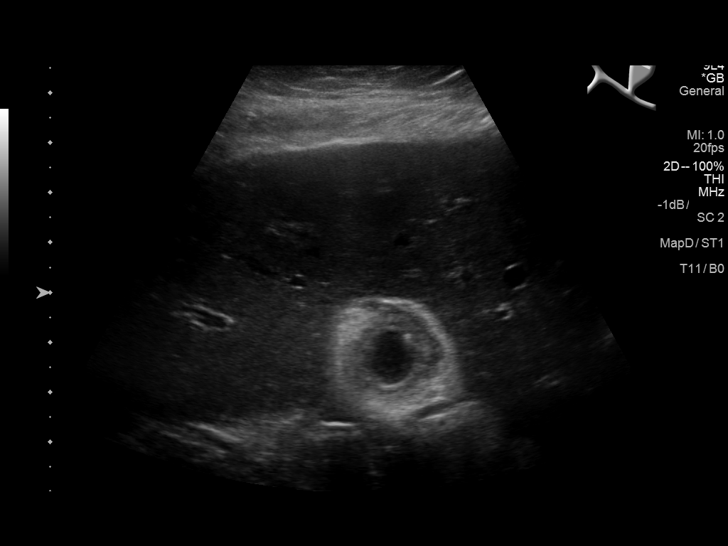
[im 13/51]
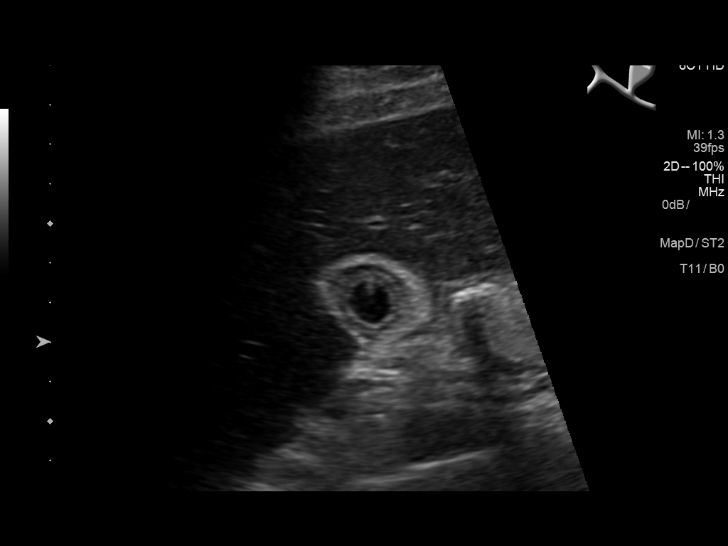
[im 17/51]
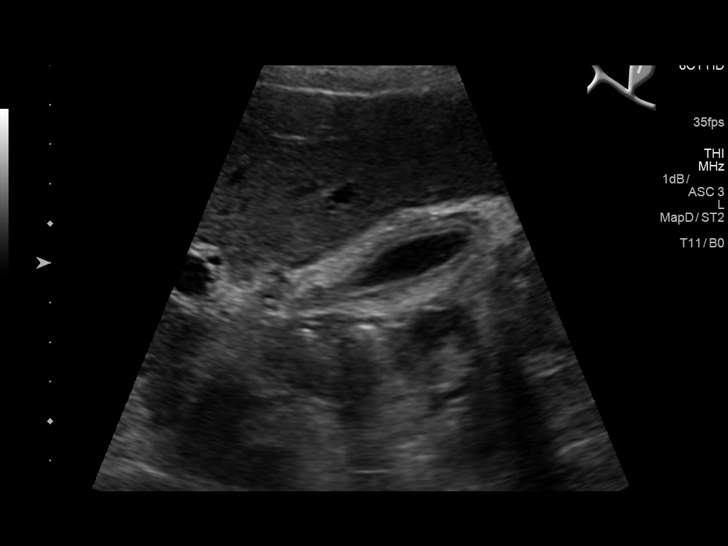
[im 19/51]
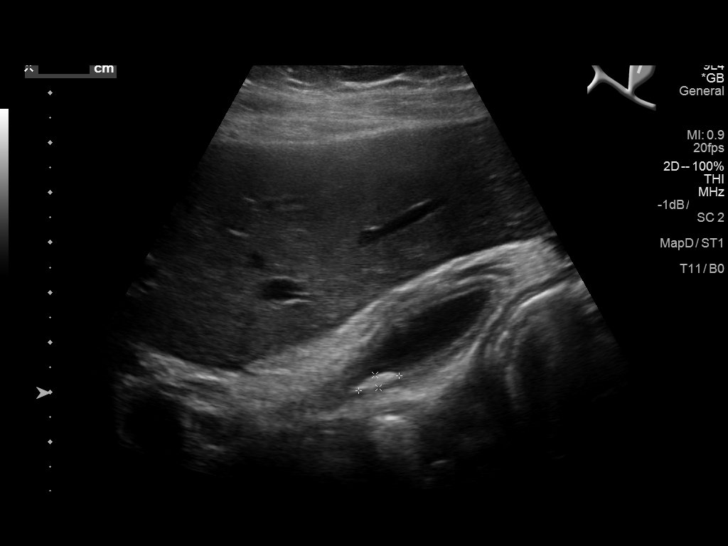
[im 23/51]
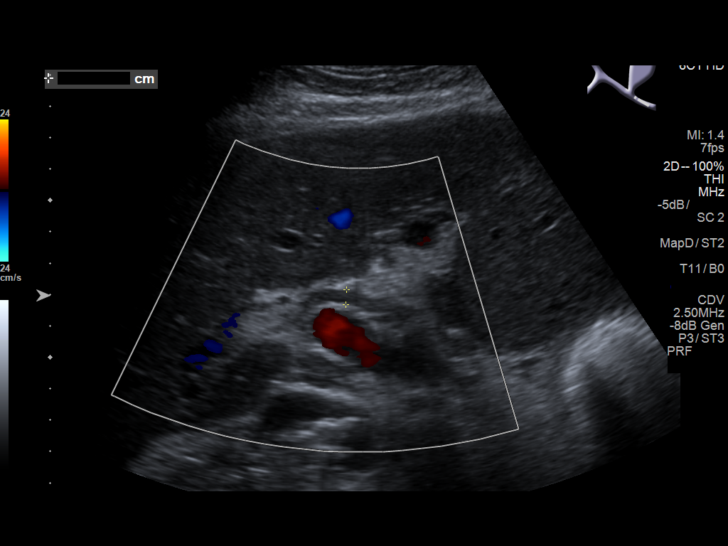
[im 28/51]
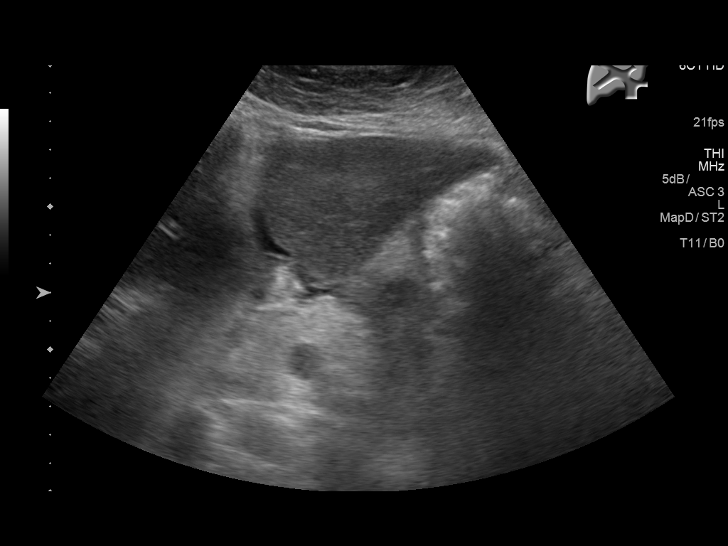
[im 32/51]
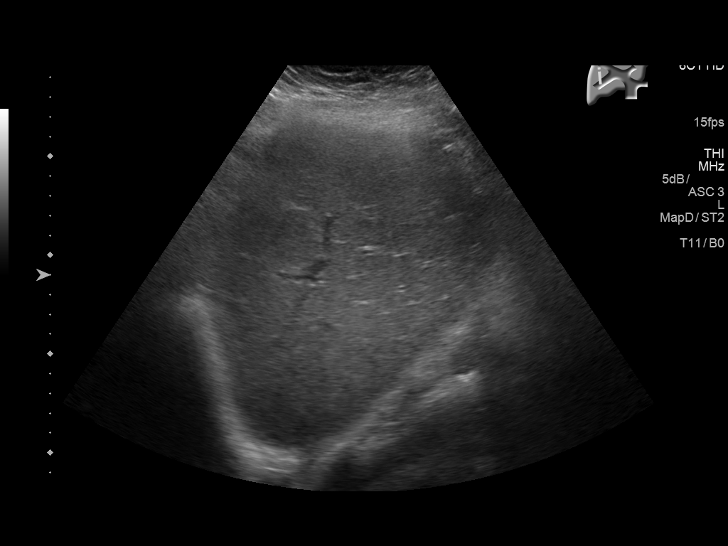
[im 34/51]
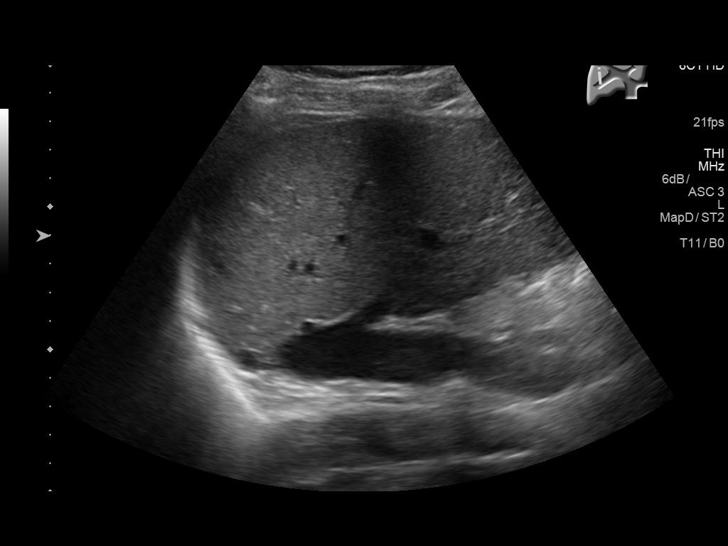
[im 38/51]
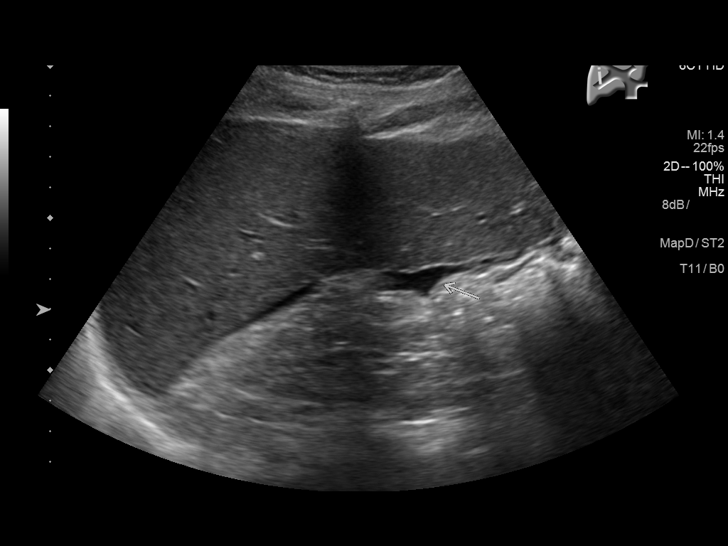
[im 42/51]
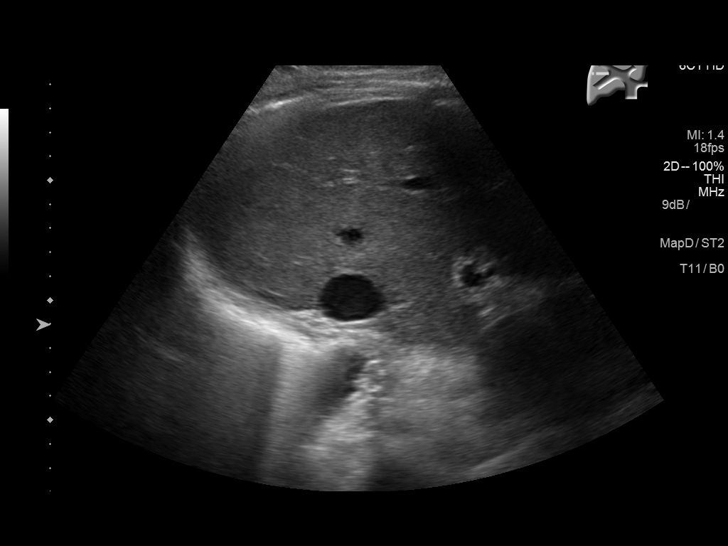
[im 46/51]
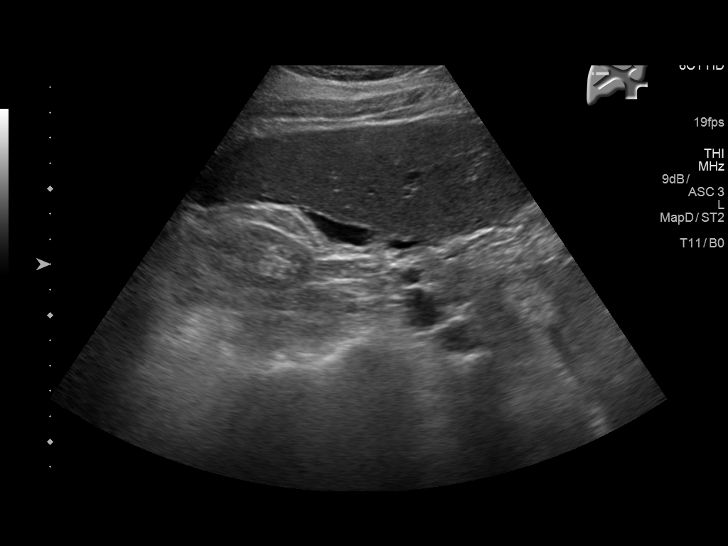
[im 51/51]
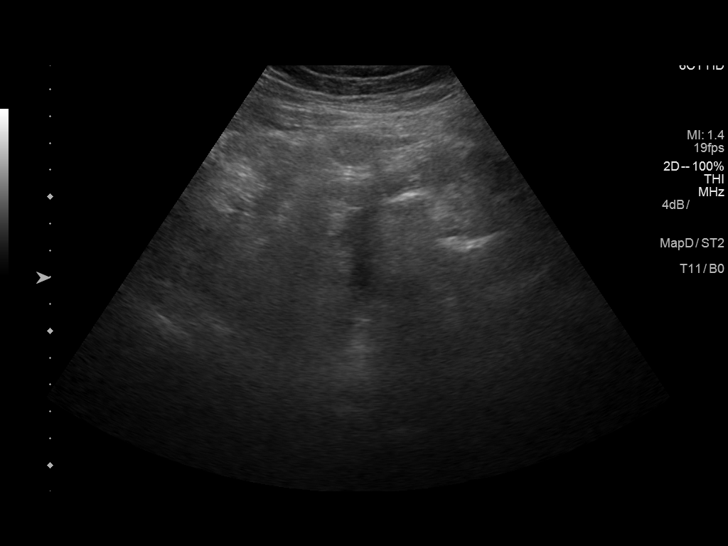

[14 of 25 positions shown; findings below may reference images not displayed]

FINDINGS: Gallbladder:

The gallbladder appears contracted. Gallbladder wall appears
thickened, measuring up to 7 mm. Non mobile 2.9 mm echogenic foci
favoring adenomyomatosis. Small on minimally mobile probable stone
is identified within the region the gallbladder neck measuring 6 mm.
Small amount of pericholecystic fluid. No sonographic Murphy's sign.

Common bile duct:

Diameter: 4.8 mm

Liver:

Normal echotexture. No focal hepatic mass. Small amount of
perihepatic fluid.
IMPRESSION: 1. Thickened gallbladder wall associated with contracted
gallbladder, making it more difficult to fully evaluate the
gallbladder.
2. Suspect gallbladder neck stone measuring 6 mm.
3. Probable foci of adenomyomatosis.

## 2017-06-22 ENCOUNTER — Encounter (INDEPENDENT_AMBULATORY_CARE_PROVIDER_SITE_OTHER): Payer: Medicare Other

## 2017-06-22 ENCOUNTER — Ambulatory Visit (INDEPENDENT_AMBULATORY_CARE_PROVIDER_SITE_OTHER): Payer: Medicare Other | Admitting: Vascular Surgery
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
## 2017-07-13 ENCOUNTER — Ambulatory Visit: Payer: Medicare Other | Admitting: Gastroenterology

## 2017-07-14 ENCOUNTER — Other Ambulatory Visit: Payer: Self-pay

## 2017-07-15 ENCOUNTER — Ambulatory Visit: Payer: Medicare Other | Admitting: Gastroenterology

## 2017-07-15 ENCOUNTER — Encounter (INDEPENDENT_AMBULATORY_CARE_PROVIDER_SITE_OTHER): Payer: Self-pay

## 2017-07-15 ENCOUNTER — Other Ambulatory Visit
Admission: RE | Admit: 2017-07-15 | Discharge: 2017-07-15 | Disposition: A | Discharge status: Home / Self Care | Payer: Medicare Other | Source: Other Acute Inpatient Hospital | Attending: Internal Medicine | Admitting: Internal Medicine

## 2017-07-15 ENCOUNTER — Other Ambulatory Visit: Payer: Self-pay

## 2017-07-15 DIAGNOSIS — I8500 Esophageal varices without bleeding: Secondary | ICD-10-CM

## 2017-07-15 DIAGNOSIS — Z1211 Encounter for screening for malignant neoplasm of colon: Secondary | ICD-10-CM

## 2017-07-23 ENCOUNTER — Other Ambulatory Visit
Admission: RE | Admit: 2017-07-23 | Discharge: 2017-07-23 | Disposition: A | Discharge status: Home / Self Care | Payer: Medicare Other | Source: Ambulatory Visit | Attending: Gastroenterology | Admitting: Gastroenterology

## 2017-07-23 DIAGNOSIS — B182 Chronic viral hepatitis C: Secondary | ICD-10-CM | POA: Diagnosis present

## 2017-07-23 DIAGNOSIS — K7469 Other cirrhosis of liver: Secondary | ICD-10-CM | POA: Insufficient documentation

## 2017-07-23 LAB — HEPATIC FUNCTION PANEL
ALT: 23 U/L (ref 14–54)
AST: 33 U/L (ref 15–41)
Albumin: 3.9 g/dL (ref 3.5–5.0)
Alkaline Phosphatase: 219 U/L — ABNORMAL HIGH (ref 38–126)
BILIRUBIN TOTAL: 0.8 mg/dL (ref 0.3–1.2)
Bilirubin, Direct: 0.1 mg/dL (ref 0.1–0.5)
Indirect Bilirubin: 0.7 mg/dL (ref 0.3–0.9)
Total Protein: 9.6 g/dL — ABNORMAL HIGH (ref 6.5–8.1)

## 2017-07-23 LAB — CBC
HCT: 45.7 % (ref 35.0–47.0)
Hemoglobin: 14.9 g/dL (ref 12.0–16.0)
MCH: 30 pg (ref 26.0–34.0)
MCHC: 32.6 g/dL (ref 32.0–36.0)
MCV: 92.1 fL (ref 80.0–100.0)
Platelets: 186 10*3/uL (ref 150–440)
RBC: 4.96 MIL/uL (ref 3.80–5.20)
RDW: 16.2 % — AB (ref 11.5–14.5)
WBC: 3.6 10*3/uL (ref 3.6–11.0)

## 2017-07-23 LAB — IRON AND TIBC
Iron: 150 ug/dL (ref 28–170)
SATURATION RATIOS: 41 % — AB (ref 10.4–31.8)
TIBC: 369 ug/dL (ref 250–450)
UIBC: 219 ug/dL

## 2017-07-23 LAB — FERRITIN: FERRITIN: 227 ng/mL (ref 11–307)

## 2017-07-24 LAB — HEPATITIS B SURFACE ANTIGEN: Hepatitis B Surface Ag: NEGATIVE

## 2017-07-24 LAB — ENA+DNA/DS+ANTICH+CENTRO+JO...
Anti JO-1: <0.2 AI (ref 0.0–0.9)
Centromere Ab Screen: <0.2 AI (ref 0.0–0.9)
SSA (Ro) (ENA) Antibody, IgG: 2.3 AI — ABNORMAL HIGH (ref 0.0–0.9)
SSB (La) (ENA) Antibody, IgG: <0.2 AI (ref 0.0–0.9)

## 2017-07-24 LAB — CERULOPLASMIN: CERULOPLASMIN: 28.1 mg/dL (ref 19.0–39.0)

## 2017-07-24 LAB — ANTI-SMOOTH MUSCLE ANTIBODY, IGG: F-ACTIN AB IGG: 34 U — AB (ref 0–19)

## 2017-07-24 LAB — MITOCHONDRIAL ANTIBODIES: Mitochondrial M2 Ab, IgG: <20 Units (ref 0.0–20.0)

## 2017-07-24 LAB — ANA W/REFLEX IF POSITIVE: ANA: POSITIVE — AB

## 2017-07-24 LAB — AFP TUMOR MARKER: AFP, SERUM, TUMOR MARKER: 3.5 ng/mL (ref 0.0–8.3)

## 2017-07-24 LAB — ALPHA-1-ANTITRYPSIN: A-1 Antitrypsin, Ser: 120 mg/dL (ref 90–200)

## 2017-07-26 NOTE — Progress Notes (Signed)
Left message for patient to call back//kdc

## 2017-07-27 ENCOUNTER — Encounter (INDEPENDENT_AMBULATORY_CARE_PROVIDER_SITE_OTHER): Payer: Medicare Other

## 2017-07-27 ENCOUNTER — Ambulatory Visit: Payer: Medicare Other | Admitting: Gastroenterology

## 2017-07-27 ENCOUNTER — Ambulatory Visit (INDEPENDENT_AMBULATORY_CARE_PROVIDER_SITE_OTHER): Payer: Medicare Other | Admitting: Vascular Surgery

## 2017-07-27 NOTE — Progress Notes (Signed)
lmom for call back//kdc

## 2017-07-28 ENCOUNTER — Other Ambulatory Visit: Payer: Self-pay

## 2017-07-28 DIAGNOSIS — R768 Other specified abnormal immunological findings in serum: Secondary | ICD-10-CM

## 2017-07-30 LAB — MISC LABCORP TEST (SEND OUT): LABCORP TEST CODE: 551300

## 2017-08-03 ENCOUNTER — Encounter: Admission: RE | Payer: Self-pay | Source: Ambulatory Visit

## 2017-08-03 ENCOUNTER — Ambulatory Visit: Admission: RE | Admit: 2017-08-03 | Payer: Medicare Other | Source: Ambulatory Visit | Admitting: Gastroenterology

## 2017-08-03 SURGERY — COLONOSCOPY WITH PROPOFOL
Anesthesia: General

## 2017-08-09 LAB — MISC LABCORP TEST (SEND OUT): LABCORP TEST CODE: 550072

## 2017-08-11 ENCOUNTER — Telehealth: Payer: Self-pay | Admitting: Gastroenterology

## 2017-08-11 NOTE — Telephone Encounter (Signed)
Called patient to check on how she is doing since her father passed away. Patient is recuperating well. She did not receive a call back from rheumatology yet.  She has chronic hepatitis C, genotype 1b, treatment nave, well compensated cirrhosis  Recommend harvoni for 12 weeks  Will start the paperwork for approval  Cephas Darby, MD [address]  [place]  [place], [place] [address]  Main: [phone]  Fax: [phone] Pager: [phone]
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
## 2017-08-19 ENCOUNTER — Telehealth: Payer: Self-pay

## 2017-08-19 NOTE — Telephone Encounter (Signed)
Harvoni has been approved.  Patient has been notified and she has already been contacted.  She will receive her meds in 24-48 hours.  Thanks [employer]

## 2017-08-31 ENCOUNTER — Telehealth: Payer: Self-pay | Admitting: Gastroenterology

## 2017-08-31 NOTE — Telephone Encounter (Signed)
Recommend changing medication from [place] to [facility] [place] [facility] [place] as patient is on amiodarone  Patient will be notified of the medication change  Cephas Darby, MD [place]  [place], [place] [address]  Main: [phone]  Fax: [phone] Pager: [phone]

## 2017-10-12 ENCOUNTER — Telehealth (INDEPENDENT_AMBULATORY_CARE_PROVIDER_SITE_OTHER): Payer: Self-pay

## 2017-10-12 ENCOUNTER — Encounter (INDEPENDENT_AMBULATORY_CARE_PROVIDER_SITE_OTHER): Payer: Medicare Other

## 2017-10-12 ENCOUNTER — Ambulatory Visit (INDEPENDENT_AMBULATORY_CARE_PROVIDER_SITE_OTHER): Payer: Medicare Other | Admitting: Vascular Surgery

## 2017-10-12 NOTE — Telephone Encounter (Signed)
Patient called stating that her transportation made her miss her 8:30 appointment this morning and that she would like a call back to reschedule this appointment, which was a 6 month follow up with a HDA ultrasound.

## 2017-12-07 ENCOUNTER — Encounter (INDEPENDENT_AMBULATORY_CARE_PROVIDER_SITE_OTHER): Payer: Medicare Other

## 2017-12-07 ENCOUNTER — Ambulatory Visit (INDEPENDENT_AMBULATORY_CARE_PROVIDER_SITE_OTHER): Payer: Medicare Other | Admitting: Vascular Surgery

## 2017-12-27 IMAGING — CR DG ABDOMEN 1V
1 series · 3 of 3 positions shown · non-contrast
Comparison: None

CLINICAL DATA: Chest and stomach pain for a while, congestion,
vomiting, dialysis patient, hypertension, prior MI and stroke

EXAM:
ABDOMEN - 1 VIEW

[Series 1: dg abd 1 view · 0.14mm/px · 3 of 3 slices shown]
[im 1/3]
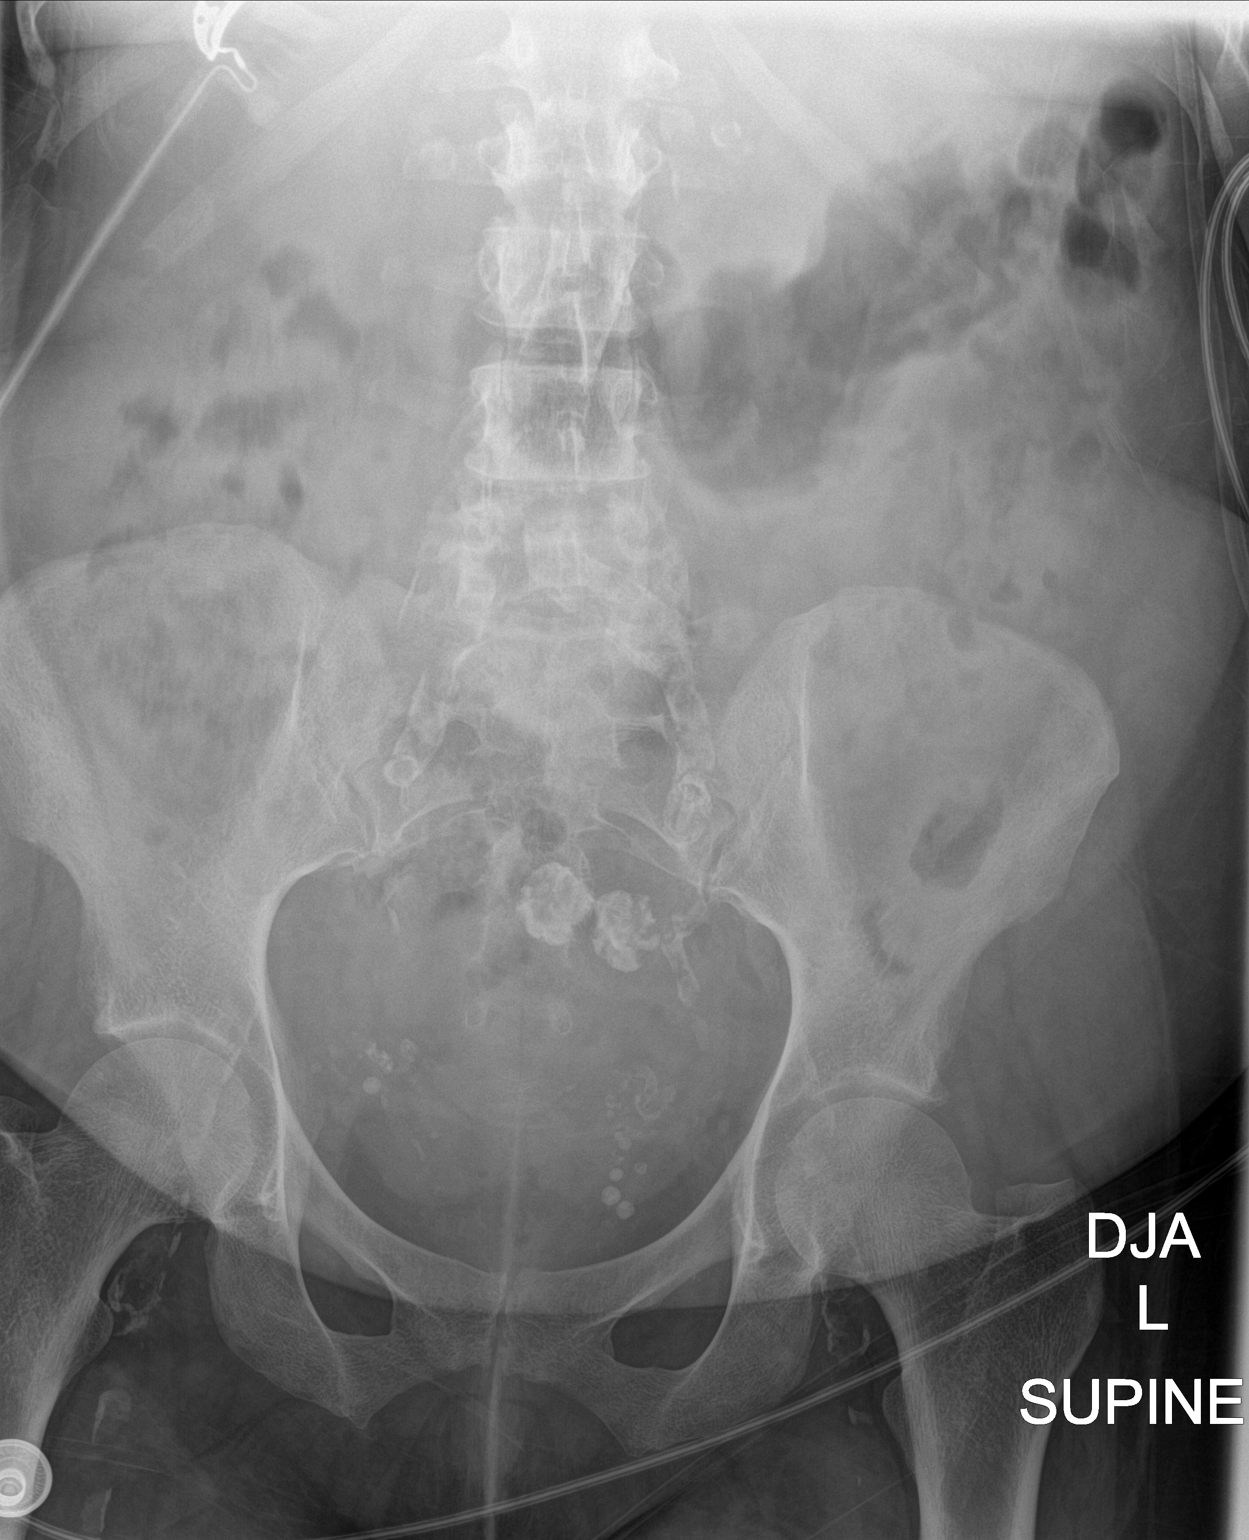
[im 2/3]
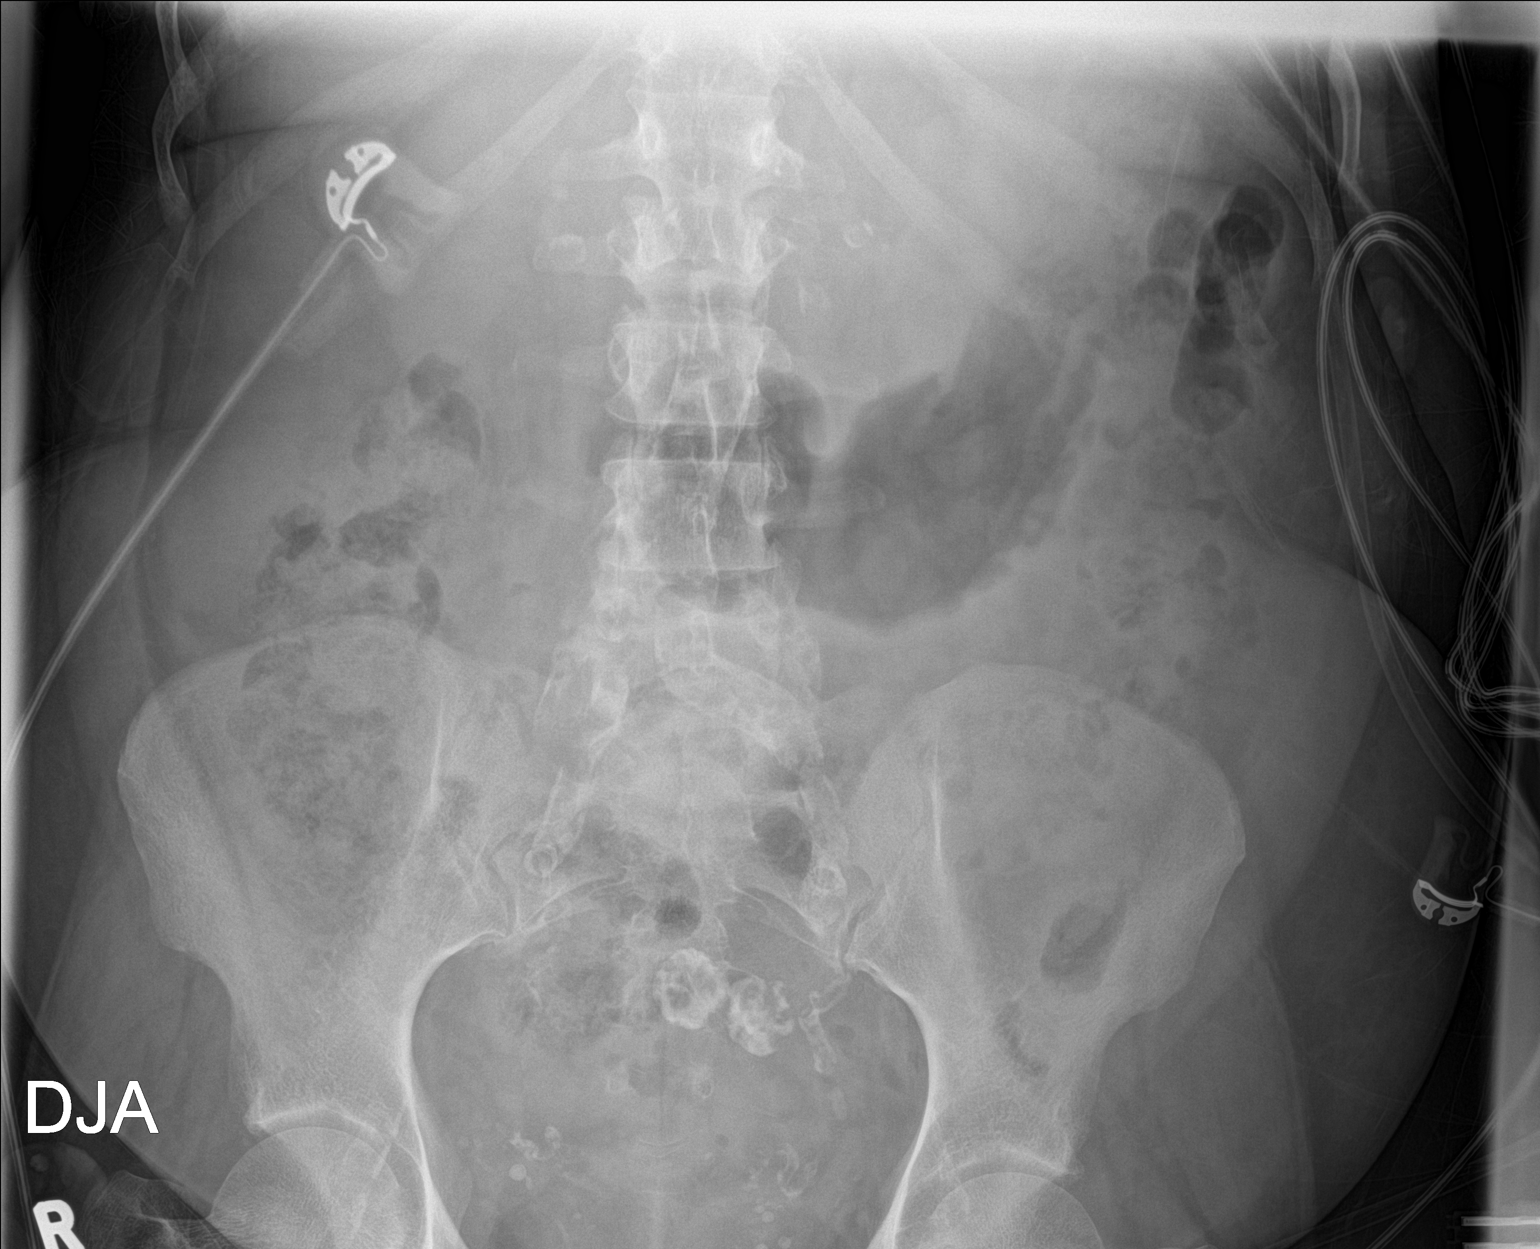
[im 3/3]
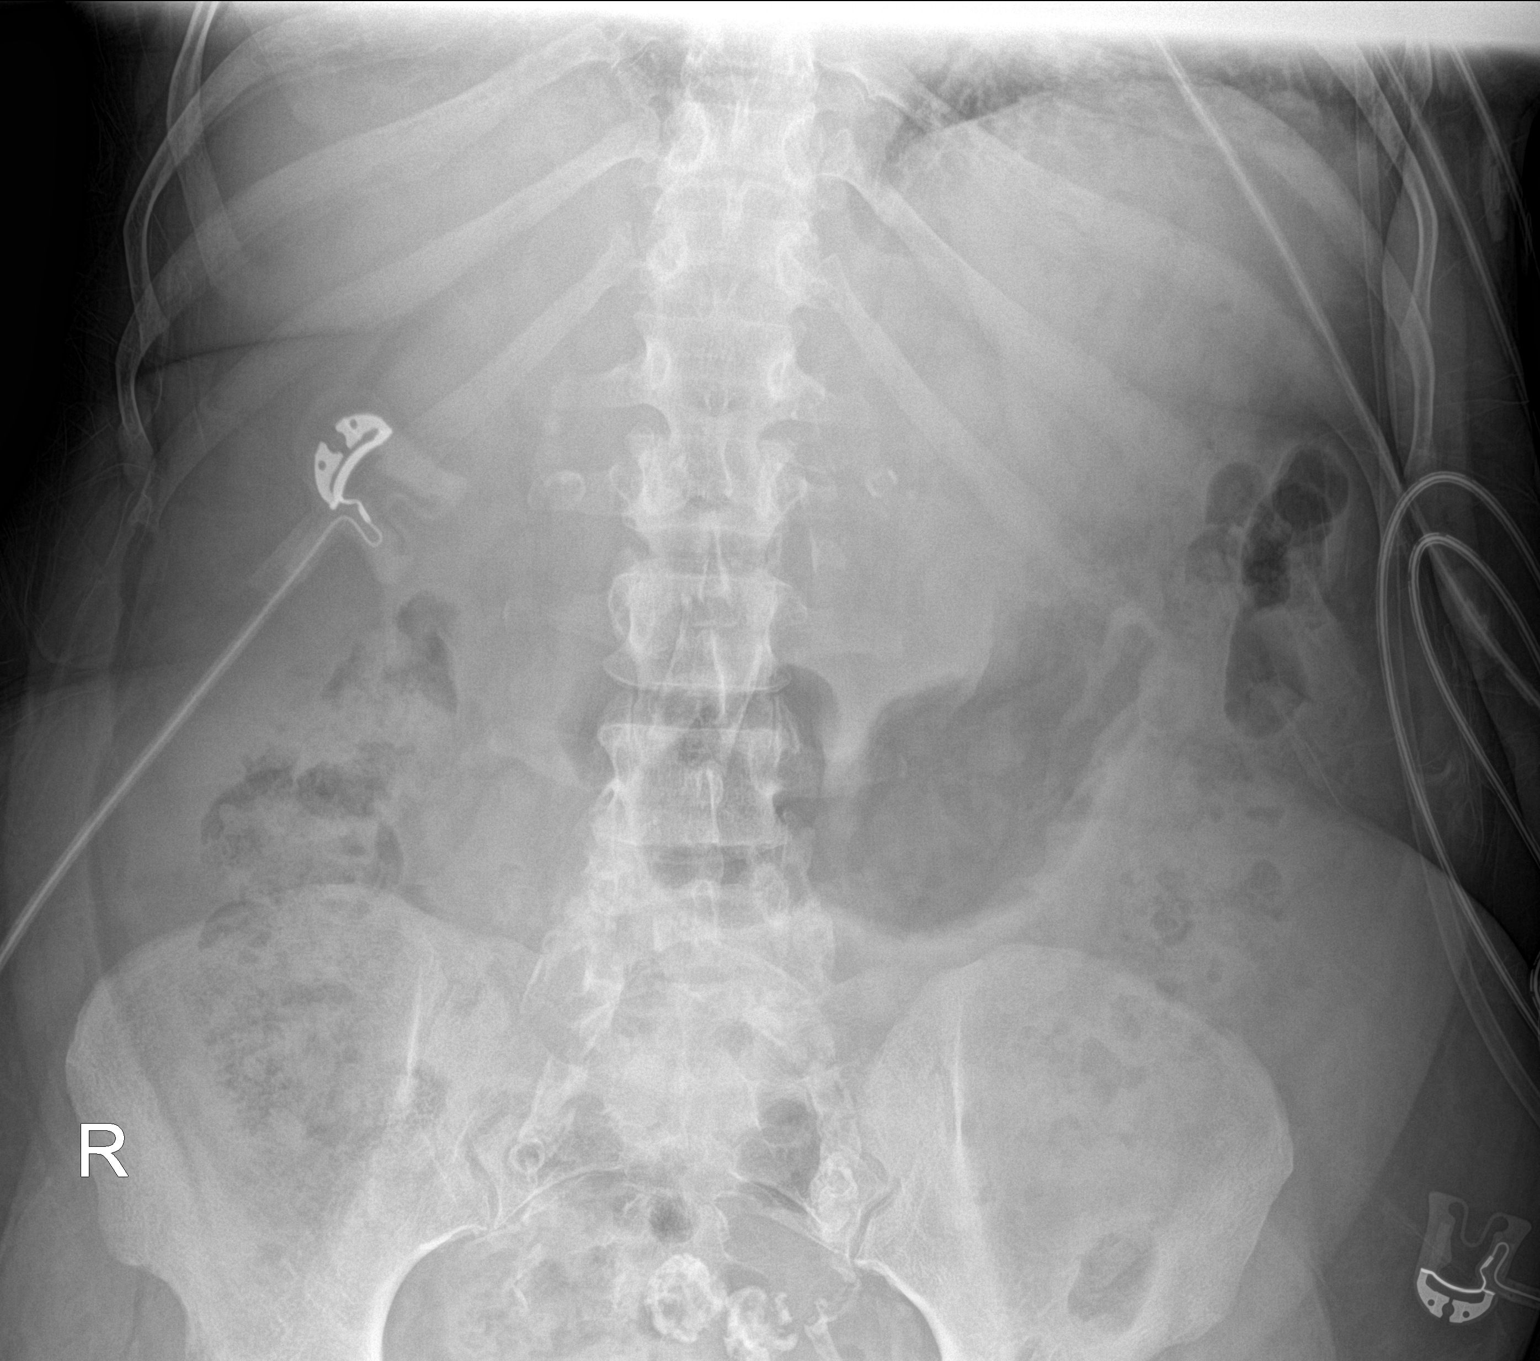

[3 of 3 positions shown; findings below may reference images not displayed]

FINDINGS: Extensive atherosclerotic calcifications aorta, iliac, and femoral
arteries.

Calcified pelvic phleboliths.

Rounded calcifications in pelvis question calcified uterine
leiomyomata versus lymph nodes.

Bowel gas pattern normal.

No bowel dilatation or bowel wall thickening.

Bones demineralized.
IMPRESSION: Normal bowel gas pattern.

Aortic atherosclerosis.

## 2017-12-27 IMAGING — CR DG CHEST 2V
2 series · 3 of 3 positions shown · non-contrast
Comparison: 11/13/2016

CLINICAL DATA: Pt states SHOB AND headache on all of the right side
x 1 week, has been sleeping with O2 @ night, hx of triple by pass
surgery February 2014.

EXAM:
CHEST  2 VIEW

[Series 2: chest lat · 0.14mm/px · 2 of 2 slices shown]
[im 1/2]
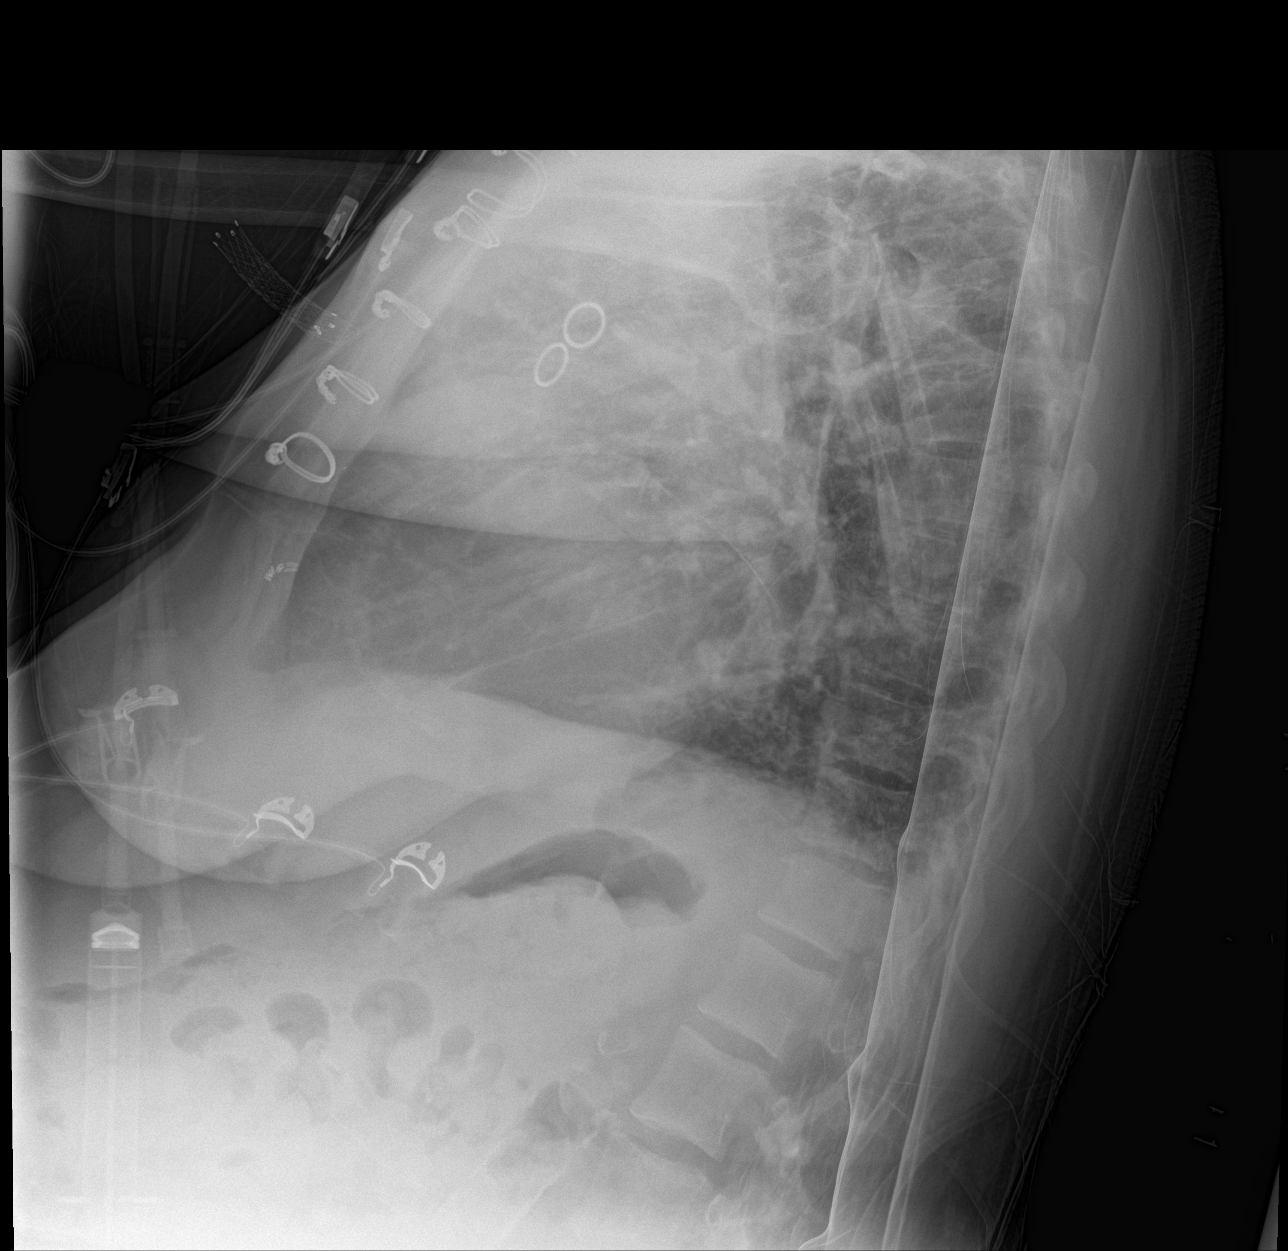
[im 2/2]
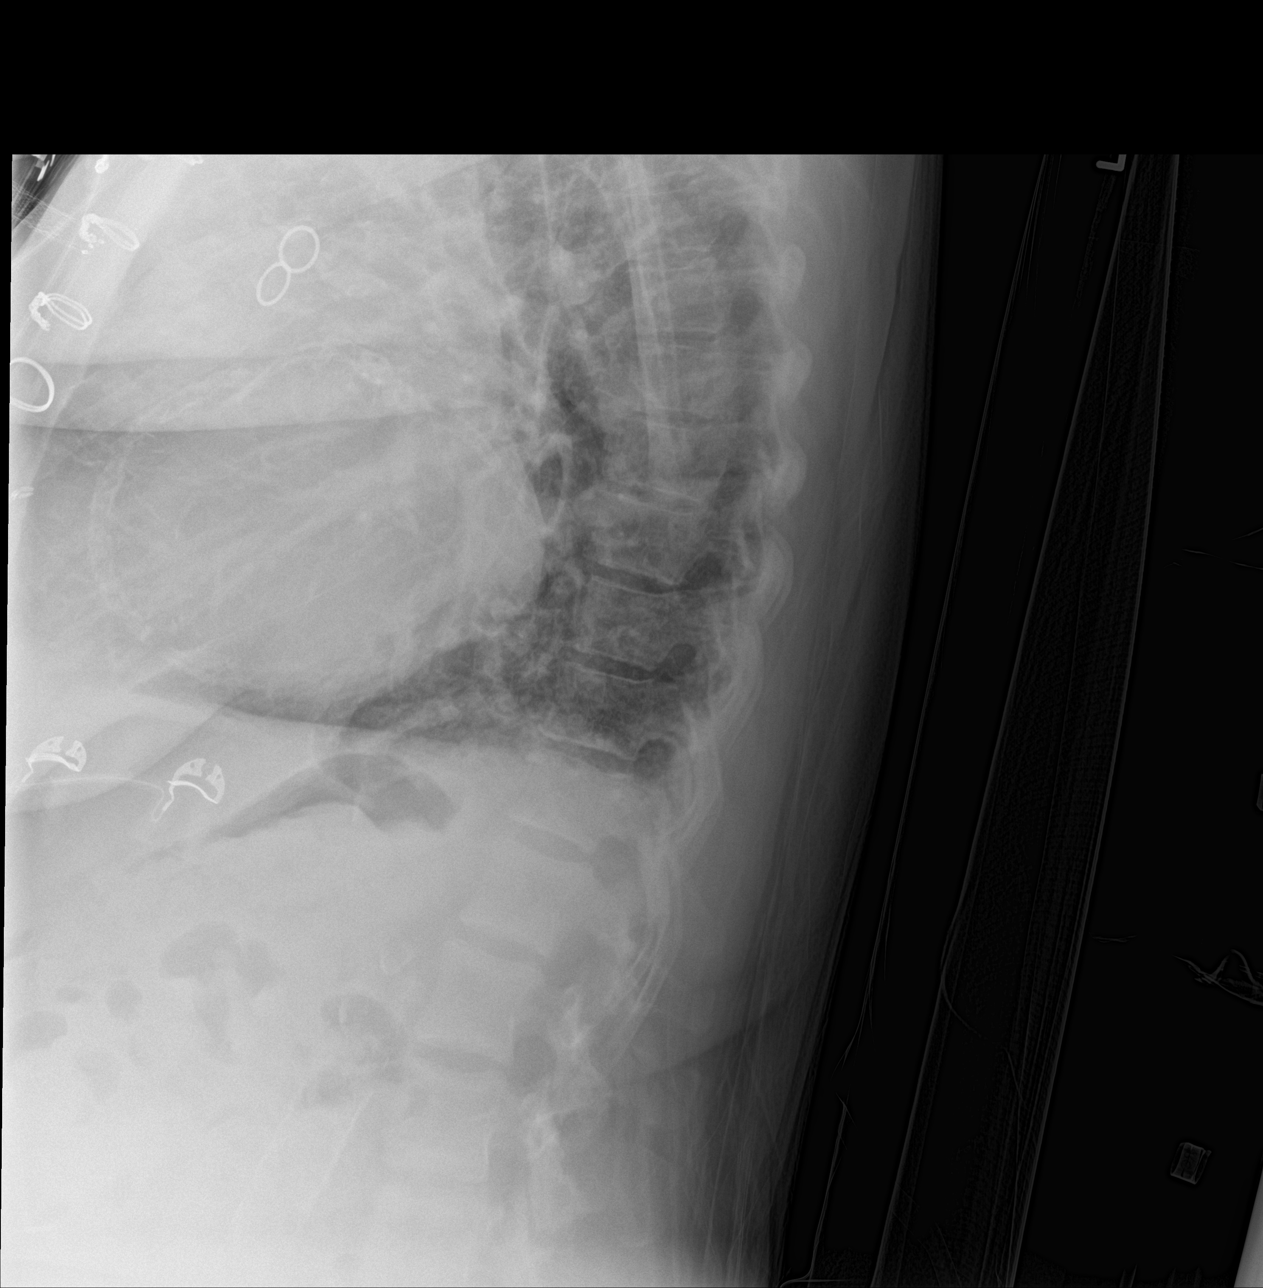

[chest ap]
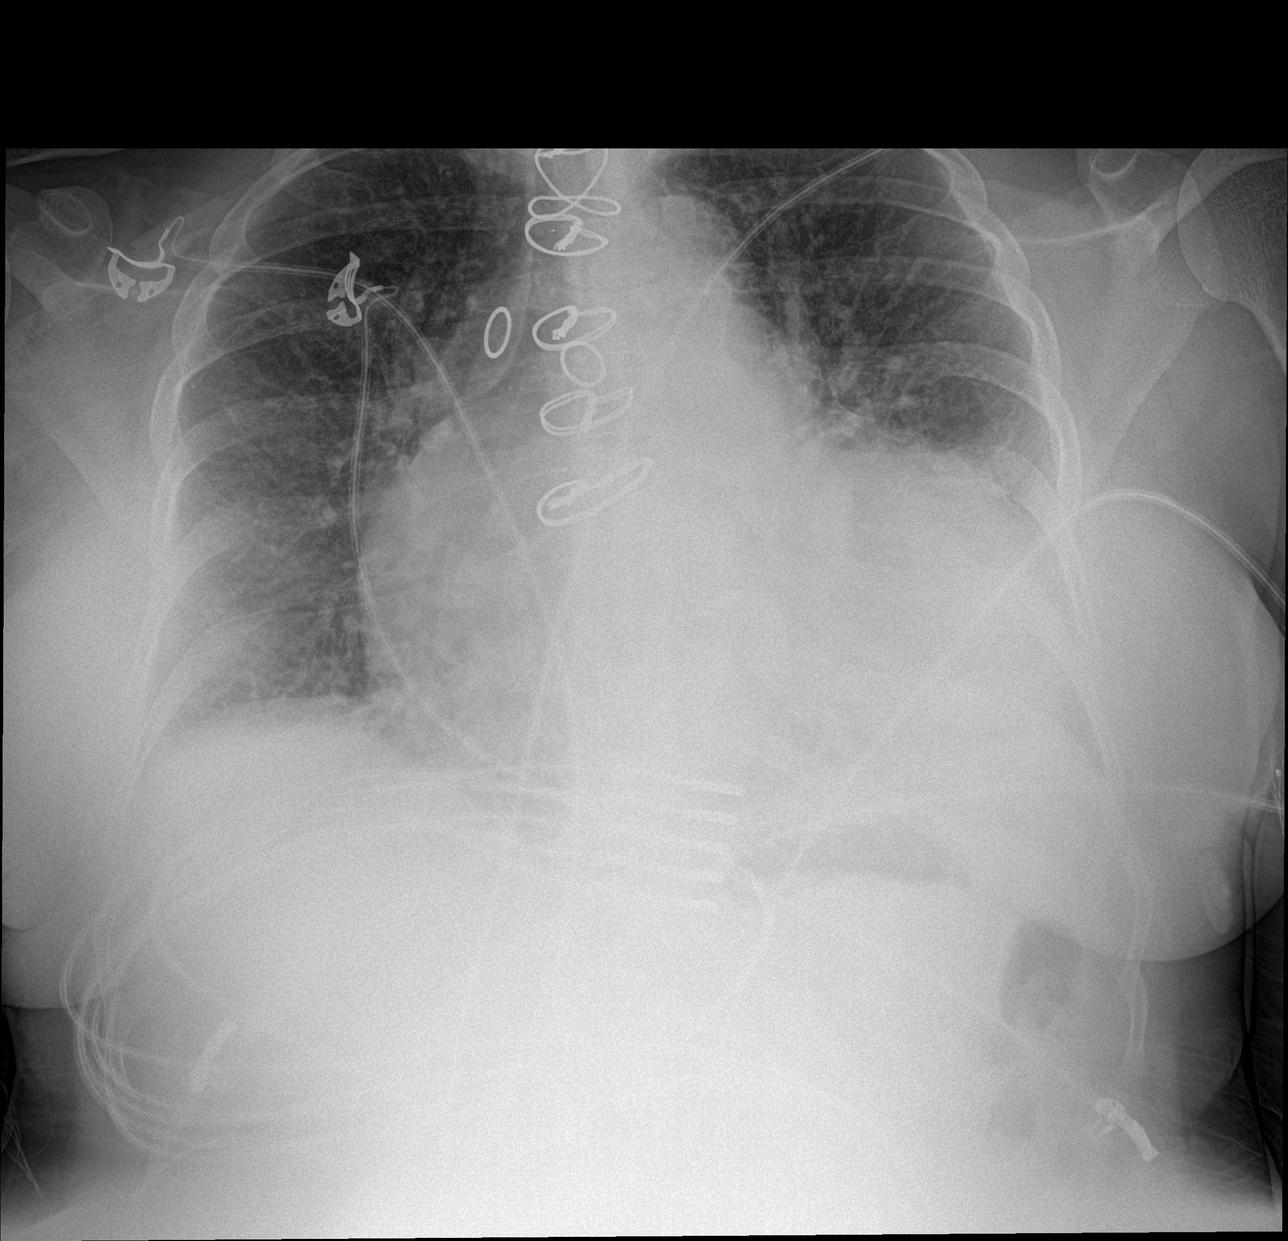

[3 of 3 positions shown; findings below may reference images not displayed]

FINDINGS: Status post median sternotomy and CABG. The heart is enlarged. There
is mild interstitial pulmonary edema. Opacity at the left lung base
may be related to cardiomegaly and atelectasis. Additional opacity
from consolidation and effusion cannot be excluded. The left
hemidiaphragm is obscured.
IMPRESSION: 1. Cardiomegaly and interstitial edema.
2. Possible left lower lobe atelectasis or infiltrate.

## 2018-01-31 ENCOUNTER — Emergency Department: Payer: Medicare Other

## 2018-01-31 ENCOUNTER — Other Ambulatory Visit: Payer: Self-pay

## 2018-01-31 ENCOUNTER — Inpatient Hospital Stay
Admission: EM | Admit: 2018-01-31 | Discharge: 2018-02-02 | DRG: 193 | Disposition: A | Discharge status: Home / Self Care | Payer: Medicare Other | Attending: Internal Medicine | Admitting: Internal Medicine

## 2018-01-31 ENCOUNTER — Encounter: Payer: Self-pay | Admitting: Emergency Medicine

## 2018-01-31 DIAGNOSIS — Z91040 Latex allergy status: Secondary | ICD-10-CM

## 2018-01-31 DIAGNOSIS — Z951 Presence of aortocoronary bypass graft: Secondary | ICD-10-CM

## 2018-01-31 DIAGNOSIS — Z992 Dependence on renal dialysis: Secondary | ICD-10-CM | POA: Diagnosis not present

## 2018-01-31 DIAGNOSIS — Z8041 Family history of malignant neoplasm of ovary: Secondary | ICD-10-CM

## 2018-01-31 DIAGNOSIS — F17210 Nicotine dependence, cigarettes, uncomplicated: Secondary | ICD-10-CM | POA: Diagnosis present

## 2018-01-31 DIAGNOSIS — N2581 Secondary hyperparathyroidism of renal origin: Secondary | ICD-10-CM | POA: Diagnosis present

## 2018-01-31 DIAGNOSIS — Z955 Presence of coronary angioplasty implant and graft: Secondary | ICD-10-CM

## 2018-01-31 DIAGNOSIS — Z7982 Long term (current) use of aspirin: Secondary | ICD-10-CM | POA: Diagnosis not present

## 2018-01-31 DIAGNOSIS — Z7901 Long term (current) use of anticoagulants: Secondary | ICD-10-CM | POA: Diagnosis not present

## 2018-01-31 DIAGNOSIS — D631 Anemia in chronic kidney disease: Secondary | ICD-10-CM | POA: Diagnosis present

## 2018-01-31 DIAGNOSIS — Z885 Allergy status to narcotic agent status: Secondary | ICD-10-CM

## 2018-01-31 DIAGNOSIS — M7989 Other specified soft tissue disorders: Secondary | ICD-10-CM

## 2018-01-31 DIAGNOSIS — I252 Old myocardial infarction: Secondary | ICD-10-CM | POA: Diagnosis not present

## 2018-01-31 DIAGNOSIS — R042 Hemoptysis: Secondary | ICD-10-CM

## 2018-01-31 DIAGNOSIS — I248 Other forms of acute ischemic heart disease: Secondary | ICD-10-CM | POA: Diagnosis present

## 2018-01-31 DIAGNOSIS — I132 Hypertensive heart and chronic kidney disease with heart failure and with stage 5 chronic kidney disease, or end stage renal disease: Secondary | ICD-10-CM | POA: Diagnosis present

## 2018-01-31 DIAGNOSIS — I480 Paroxysmal atrial fibrillation: Secondary | ICD-10-CM | POA: Diagnosis present

## 2018-01-31 DIAGNOSIS — Z8673 Personal history of transient ischemic attack (TIA), and cerebral infarction without residual deficits: Secondary | ICD-10-CM | POA: Diagnosis not present

## 2018-01-31 DIAGNOSIS — I2510 Atherosclerotic heart disease of native coronary artery without angina pectoris: Secondary | ICD-10-CM | POA: Diagnosis present

## 2018-01-31 DIAGNOSIS — E785 Hyperlipidemia, unspecified: Secondary | ICD-10-CM | POA: Diagnosis present

## 2018-01-31 DIAGNOSIS — J189 Pneumonia, unspecified organism: Secondary | ICD-10-CM | POA: Diagnosis present

## 2018-01-31 DIAGNOSIS — I2699 Other pulmonary embolism without acute cor pulmonale: Secondary | ICD-10-CM

## 2018-01-31 DIAGNOSIS — I5042 Chronic combined systolic (congestive) and diastolic (congestive) heart failure: Secondary | ICD-10-CM | POA: Diagnosis present

## 2018-01-31 DIAGNOSIS — J159 Unspecified bacterial pneumonia: Principal | ICD-10-CM | POA: Diagnosis present

## 2018-01-31 DIAGNOSIS — K219 Gastro-esophageal reflux disease without esophagitis: Secondary | ICD-10-CM | POA: Diagnosis present

## 2018-01-31 DIAGNOSIS — R079 Chest pain, unspecified: Secondary | ICD-10-CM

## 2018-01-31 DIAGNOSIS — N186 End stage renal disease: Secondary | ICD-10-CM | POA: Diagnosis present

## 2018-01-31 DIAGNOSIS — Z841 Family history of disorders of kidney and ureter: Secondary | ICD-10-CM

## 2018-01-31 DIAGNOSIS — I429 Cardiomyopathy, unspecified: Secondary | ICD-10-CM | POA: Diagnosis present

## 2018-01-31 HISTORY — DX: Heart failure, unspecified: I50.9

## 2018-01-31 HISTORY — DX: Disorder of kidney and ureter, unspecified: N28.9

## 2018-01-31 HISTORY — DX: Paroxysmal atrial fibrillation: I48.0

## 2018-01-31 LAB — TROPONIN I: Troponin I: 0.06 ng/mL (ref <0.03)

## 2018-01-31 LAB — BASIC METABOLIC PANEL
Anion gap: 8 (ref 5–15)
BUN: 20 mg/dL (ref 6–20)
CALCIUM: 7.8 mg/dL — AB (ref 8.9–10.3)
CHLORIDE: 103 mmol/L (ref 98–111)
CO2: 28 mmol/L (ref 22–32)
CREATININE: 6.24 mg/dL — AB (ref 0.44–1.00)
GFR calc non Af Amer: 7 mL/min — ABNORMAL LOW (ref >60)
GFR, EST AFRICAN AMERICAN: 8 mL/min — AB (ref >60)
Glucose, Bld: 114 mg/dL — ABNORMAL HIGH (ref 70–99)
Potassium: 3.7 mmol/L (ref 3.5–5.1)
SODIUM: 139 mmol/L (ref 135–145)

## 2018-01-31 LAB — CBC
HCT: 42 % (ref 35.0–47.0)
Hemoglobin: 13.9 g/dL (ref 12.0–16.0)
MCH: 29.3 pg (ref 26.0–34.0)
MCHC: 33 g/dL (ref 32.0–36.0)
MCV: 88.7 fL (ref 80.0–100.0)
Platelets: 167 10*3/uL (ref 150–440)
RBC: 4.73 MIL/uL (ref 3.80–5.20)
RDW: 15.7 % — ABNORMAL HIGH (ref 11.5–14.5)
WBC: 3.8 10*3/uL (ref 3.6–11.0)

## 2018-01-31 MED ORDER — HEPARIN BOLUS VIA INFUSION
4500.0000 [IU] | Freq: Once | INTRAVENOUS | Status: AC
  Administered 2018-01-31: 4500 [IU] via INTRAVENOUS
  Filled 2018-01-31: qty 4500

## 2018-01-31 MED ORDER — ACETAMINOPHEN 650 MG RE SUPP: 650.0000 mg | Freq: Four times a day (QID) | RECTAL | Status: DC | PRN

## 2018-01-31 MED ORDER — SEVELAMER CARBONATE 800 MG PO TABS
3200.0000 mg | ORAL_TABLET | Freq: Three times a day (TID) | ORAL | Status: DC
  Administered 2018-02-01 – 2018-02-02 (×4): 3200 mg via ORAL
  Filled 2018-01-31 (×5): qty 4

## 2018-01-31 MED ORDER — SODIUM CHLORIDE 0.9 % IV SOLN
500.0000 mg | Freq: Once | INTRAVENOUS | Status: AC
  Administered 2018-02-01: 500 mg via INTRAVENOUS
  Filled 2018-01-31 (×2): qty 500

## 2018-01-31 MED ORDER — SODIUM CHLORIDE 0.9 % IV SOLN
1.0000 g | Freq: Once | INTRAVENOUS | Status: AC
  Administered 2018-01-31: 1 g via INTRAVENOUS
  Filled 2018-01-31: qty 1
  Filled 2018-01-31: qty 10

## 2018-01-31 MED ORDER — ONDANSETRON HCL 4 MG/2ML IJ SOLN
4.0000 mg | Freq: Four times a day (QID) | INTRAMUSCULAR | Status: DC | PRN
  Administered 2018-01-31 – 2018-02-02 (×3): 4 mg via INTRAVENOUS
  Filled 2018-01-31 (×3): qty 2

## 2018-01-31 MED ORDER — ACETAMINOPHEN 325 MG PO TABS: 650.0000 mg | ORAL_TABLET | Freq: Four times a day (QID) | ORAL | Status: DC | PRN

## 2018-01-31 MED ORDER — HYDROMORPHONE HCL 1 MG/ML IJ SOLN
0.5000 mg | Freq: Four times a day (QID) | INTRAMUSCULAR | Status: DC | PRN
  Administered 2018-01-31 – 2018-02-01 (×3): 0.5 mg via INTRAVENOUS
  Filled 2018-01-31 (×3): qty 0.5

## 2018-01-31 MED ORDER — HEPARIN (PORCINE) IN NACL 100-0.45 UNIT/ML-% IJ SOLN
1300.0000 [IU]/h | INTRAMUSCULAR | Status: DC
  Administered 2018-01-31: 1300 [IU]/h via INTRAVENOUS
  Filled 2018-01-31 (×2): qty 250

## 2018-01-31 MED ORDER — HYDROXYZINE HCL 25 MG PO TABS
50.0000 mg | ORAL_TABLET | Freq: Two times a day (BID) | ORAL | Status: DC
  Administered 2018-01-31 – 2018-02-02 (×5): 50 mg via ORAL
  Filled 2018-01-31 (×5): qty 2

## 2018-01-31 MED ORDER — AZITHROMYCIN 250 MG PO TABS
250.0000 mg | ORAL_TABLET | Freq: Every day | ORAL | Status: DC
  Administered 2018-02-01: 250 mg via ORAL
  Filled 2018-01-31 (×2): qty 1

## 2018-01-31 MED ORDER — PANTOPRAZOLE SODIUM 40 MG PO TBEC
40.0000 mg | DELAYED_RELEASE_TABLET | Freq: Every day | ORAL | Status: DC
  Administered 2018-02-01 – 2018-02-02 (×2): 40 mg via ORAL
  Filled 2018-01-31 (×2): qty 1

## 2018-01-31 MED ORDER — ONDANSETRON HCL 4 MG PO TABS: 4.0000 mg | ORAL_TABLET | Freq: Four times a day (QID) | ORAL | Status: DC | PRN

## 2018-01-31 MED ORDER — SODIUM CHLORIDE 0.9 % IV SOLN
1.0000 g | INTRAVENOUS | Status: DC
  Administered 2018-02-01: 1 g via INTRAVENOUS
  Filled 2018-01-31: qty 1

## 2018-01-31 MED ORDER — CINACALCET HCL 30 MG PO TABS
60.0000 mg | ORAL_TABLET | Freq: Every evening | ORAL | Status: DC
  Administered 2018-01-31 – 2018-02-02 (×3): 60 mg via ORAL
  Filled 2018-01-31 (×4): qty 2

## 2018-01-31 MED ORDER — IOPAMIDOL (ISOVUE-370) INJECTION 76%
75.0000 mL | Freq: Once | INTRAVENOUS | Status: AC | PRN
  Administered 2018-01-31: 75 mL via INTRAVENOUS

## 2018-01-31 NOTE — ED Provider Notes (Signed)
[facility] Emergency Department Provider Note   ____________________________________________   I have reviewed the triage vital signs and the nursing notes.   HISTORY  Chief Complaint Chest Pain   History limited by: Not Limited   HPI [name] is a 57 y.o. female who presents to the emergency department today with concerns for chest pain and hemoptysis.  Patient states that the chest pain started a few days ago.  Looking in the left lower chest.  She states it is both burning as well as sharp.  She has not noticed any significant shortness of breath with this.  She describes today however that she started coughing up blood.  She is also felt nauseous and states she has vomited up blood a couple of times.  She denies similar symptoms in the past.   Per medical record review patient has a history of ESRD on dialysis, MI, CVA, heart failure.  Past Medical History:  Diagnosis Date  . Heart failure ([place])   . Hyperlipidemia   . Hypertension   . Myocardial infarction ([place])   . Renal failure   . Stroke [facility]) 2011    Patient Active Problem List   Diagnosis Date Noted  . Swelling of limb 09/22/2016  . Kidney dialysis as the cause of abnormal reaction of the patient, or of later complication, without mention of misadventure at the time of the procedure (CODE) 03/24/2016  . Hyperkalemia 11/13/2014  . Generalized weakness 10/15/2014  . Chest pain 10/15/2014  . ESRD on hemodialysis ([place]) 10/15/2014  . Congestive heart failure (CHF) ([place]) 10/15/2014  . HTN (hypertension) 10/15/2014  . Sternal wound dehiscence 04/12/2014  . 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 Pericardial effusion 04/12/2014  . History of delirium 04/12/2014  . H/O atrial flutter 04/12/2014  . Delayed surgical wound healing 04/12/2014  . Chest wall pain following surgery 04/12/2014  . S/P CABG (coronary artery bypass graft) 04/02/2014  . Lactic acidosis 03/26/2014  .  Arteriosclerotic dementia 03/25/2014  . Atrial flutter by electrocardiogram ([place]) 03/22/2014  . Superficial incisional surgical site infection 03/14/2014  . Postoperative anemia due to acute blood loss 02/27/2014  . Obesity 02/27/2014  . H/O: substance abuse 02/27/2014  . Anemia of chronic renal failure 02/27/2014  . Acute postoperative pain 02/27/2014  . Leukocytosis 02/26/2014  . Hypotension 02/24/2014  . Exhausted vascular access 02/24/2014  . Anemia of chronic disease 02/24/2014  . Stroke ([place]) 02/21/2014  . S/P CABG x 3 02/21/2014  . Dialysis patient ([place]) 02/21/2014  . Hyperlipidemia 02/13/2014  . History of hepatitis C 02/13/2014  . HFrEF (heart failure with reduced ejection fraction) ([place]) 02/13/2014  . H/O stroke without residual deficits 02/13/2014  . ESRD (end stage renal disease) on dialysis ([place]) 02/13/2014  . End stage renal disease with hypertension ([place]) 02/13/2014  . CAD, multiple vessel 02/13/2014  . Retrosternal chest pain 05/30/2013  . Gallstones 05/29/2013    Past Surgical History:  Procedure Laterality Date  . CORONARY ANGIOPLASTY WITH STENT PLACEMENT  2013  . DIALYSIS FISTULA CREATION      Prior to Admission medications   Medication Sig Start Date End Date Taking? Authorizing Provider  amiodarone (PACERONE) 200 MG tablet Take 200 mg by mouth daily.    [provider]  amitriptyline (ELAVIL) 25 MG tablet Take 25 mg by mouth at bedtime.    [provider]  amLODipine (NORVASC) 10 MG tablet  07/06/16   [provider]  amoxicillin-clavulanate (AUGMENTIN) 500-125 MG tablet  05/10/17  [provider]  Apixaban (ELIQUIS PO) Take by mouth.    [provider]  aspirin 81 MG tablet Take 81 mg by mouth daily.    [provider]  cyclobenzaprine (FLEXERIL) 10 MG tablet Take 1 tablet (10 mg total) by mouth 3 (three) times daily as needed for muscle spasms. 04/12/16   Lisa Roca, MD  docusate sodium (COLACE)  100 MG capsule Take 100 mg by mouth 2 (two) times daily.    [provider]  ELIQUIS 5 MG TABS tablet Take 5 mg by mouth 2 (two) times daily.  02/25/16   [provider]  fluconazole (DIFLUCAN) 150 MG tablet  07/31/16   [provider]  hydrALAZINE (APRESOLINE) 50 MG tablet hydralazine 50 mg tablet    [provider]  HYDROcodone-acetaminophen (NORCO/VICODIN) 5-325 MG tablet Take 1 tablet by mouth every 4 (four) hours as needed for moderate pain. 07/18/15   Harvest Dark, MD  hydrOXYzine (ATARAX/VISTARIL) 25 MG tablet Take 1 tablet by mouth 2 (two) times daily as needed.  05/18/13   [provider]  hydrOXYzine (ATARAX/VISTARIL) 50 MG tablet Take 50 mg by mouth once.    [provider]  ibuprofen (ADVIL,MOTRIN) 600 MG tablet Take 1 tablet (600 mg total) by mouth every 8 (eight) hours as needed. 04/12/16   Lisa Roca, MD  midodrine (PROAMATINE) 10 MG tablet Take 10 mg by mouth 1 day or 1 dose.    [provider]  ondansetron (ZOFRAN ODT) 4 MG disintegrating tablet Allow 1-2 tablets to dissolve in your mouth every 8 hours as needed for nausea/vomiting 01/13/16   Hinda Kehr, MD  pantoprazole (PROTONIX) 40 MG tablet Take 1 tablet by mouth daily. 05/07/13   [provider]  sennosides-docusate sodium (SENOKOT-S) 8.6-50 MG tablet Take 2 tablets by mouth daily.    [provider]  SENSIPAR 30 MG tablet Take 1 tablet by mouth daily. 05/10/13   [provider]  sevelamer carbonate (RENVELA) 800 MG tablet Take 2,400 mg by mouth 3 (three) times daily with meals.     [provider]    Allergies Morphine and related; Latex; and Tape  Family History  Problem Relation Age of Onset  . Hypertension Unknown   . Cancer Unknown   . Renal Disease Unknown     Social History Social History   Tobacco Use  . Smoking status: Current Every Day Smoker    Packs/day: 0.50    Years: 20.00    Pack years: 10.00     Types: Cigarettes  . Smokeless tobacco: Never Used  Substance Use Topics  . Alcohol use: No  . Drug use: No    Review of Systems Constitutional: No fever/chills Eyes: No visual changes. ENT: No sore throat. Cardiovascular: Positive for chest pain. Respiratory: Positive for bloody cough. Gastrointestinal: No abdominal pain.  No nausea, no vomiting.  No diarrhea.   Genitourinary: Negative for dysuria. Musculoskeletal: Negative for back pain. Skin: Negative for rash. Neurological: Negative for headaches, focal weakness or numbness.  ____________________________________________   PHYSICAL EXAM:  VITAL SIGNS: ED Triage Vitals  Enc Vitals Group     BP 01/31/18 1509 122/78     Pulse Rate 01/31/18 1509 83     Resp 01/31/18 1509 18     Temp 01/31/18 1509 98.4 F (36.9 C)     Temp Source 01/31/18 1509 Oral     SpO2 01/31/18 1509 97 %     Weight 01/31/18 1509 173 lb (78.5 kg)  Height 01/31/18 1509 5\' 6"  (1.676 m)     Head Circumference --      Peak Flow --      Pain Score 01/31/18 1513 8   Constitutional: Alert and oriented.  Eyes: Conjunctivae are normal.  ENT      Head: Normocephalic and atraumatic.      Nose: No congestion/rhinnorhea.      Mouth/Throat: Mucous membranes are moist.      Neck: No stridor. Hematological/Lymphatic/Immunilogical: No cervical lymphadenopathy. Cardiovascular: Normal rate, regular rhythm.  No murmurs, rubs, or gallops.  Respiratory: Normal respiratory effort without tachypnea nor retractions. Breath sounds are clear and equal bilaterally. No wheezes/rales/rhonchi. Gastrointestinal: Soft and non tender. No rebound. No guarding.  Genitourinary: Deferred Musculoskeletal: Normal range of motion in all extremities. No lower extremity edema. Neurologic:  Normal speech and language. No gross focal neurologic deficits are appreciated.  Skin:  Skin is warm, dry and intact. No rash noted. Psychiatric: Mood and affect are normal. Speech and  behavior are normal. Patient exhibits appropriate insight and judgment.  ____________________________________________    LABS (pertinent positives/negatives)  Trop 0.06 CBC wbc 3.8, hgb 13.9, plt 167 BMP na 139, k 3.7, glu 114, cr 6.24  ____________________________________________   EKG  I, Nance Pear, attending physician, personally viewed and interpreted this EKG  EKG Time: 1509 Rate: 85 Rhythm: sinus rhythm with PVC Axis: normal Intervals: qtc 554 QRS: RBBB ST changes: no st elevation Impression: abnormal ekg   ____________________________________________    RADIOLOGY  CXR Heart failure  CT angio  PE in left lung. Concern for pneumonia as well.  I, Nance Pear, personally discussed these images (CT scan) and results by phone with the on-call radiologist and used this discussion as part of my medical decision making.   ____________________________________________   PROCEDURES  Procedures  CRITICAL CARE Performed by: Nance Pear   Total critical care time: 30 minutes  Critical care time was exclusive of separately billable procedures and treating other patients.  Critical care was necessary to treat or prevent imminent or life-threatening deterioration.  Critical care was time spent personally by me on the following activities: development of treatment plan with patient and/or surrogate as well as nursing, examination of patient, obtaining history from patient or surrogate, ordering and performing treatments and interventions, ordering and review of laboratory studies, ordering and review of radiographic studies, pulse oximetry and re-evaluation of patient's condition.  ____________________________________________   INITIAL IMPRESSION / ASSESSMENT AND PLAN / ED COURSE  Pertinent labs & imaging results that were available during my care of the patient were reviewed by me and considered in my medical decision making (see chart for  details).  Presented to the emergency department today because of concerns for chest pain and hemoptysis.  Differential would include pneumonia, cancer, PE amongst other etiologies.  Chest x-ray is concerning for some pulmonary vascular congestion.  CT PE was obtained and did show a pulmonary embolism in the left lower lung.  I do think this would explain the patient's symptoms.  Is also some concern for pneumonia.  Given patient's baseline medical health and findings of both PE and pneumonia will plan on admission.  Discussed findings plan with patient.   ____________________________________________   FINAL CLINICAL IMPRESSION(S) / ED DIAGNOSES  Final diagnoses:  Nonspecific chest pain  Hemoptysis  Acute pulmonary embolism without acute cor pulmonale, unspecified pulmonary embolism type ([place])  Pneumonia due to infectious organism, unspecified laterality, unspecified part of lung     Note: This dictation was  prepared with [employer] . Any transcriptional errors that result from this process are unintentional     Nance Pear, MD 01/31/18 1911

## 2018-01-31 NOTE — ED Triage Notes (Signed)
Patient presents to the ED with chest pain x 3 days.  Patient describes pain as sharp that has become constant.  Patient also reports she has recently been very congested and "coughed up blood" occasionally for the past 3 days.  Patient states blood is bright red.  Patient reports night sweats.

## 2018-01-31 NOTE — ED Triage Notes (Signed)
First Nurse Note:  C/O hemoptysis and chest pain x 1 week.  AAOx3.  Skin warm and dry.  No SOB/ DOE.  NAD

## 2018-01-31 NOTE — ED Notes (Signed)
Patient transported to CT 

## 2018-01-31 NOTE — Progress Notes (Signed)
ANTICOAGULATION CONSULT NOTE - Initial Consult  Pharmacy Consult for Heparin  Indication: pulmonary embolus  Allergies  Allergen Reactions  . Morphine And Related Hives  . Latex Rash  . Tape Rash    Patient Measurements: Height: 5\' 6"  (167.6 cm) Weight: 173 lb (78.5 kg) IBW/kg (Calculated) : 59.3 Heparin Dosing Weight: 75.4  Vital Signs: Temp: 98.4 F (36.9 C) (08/19 1509) Temp Source: Oral (08/19 1509) BP: 100/59 (08/19 1700) Pulse Rate: 83 (08/19 1509)  Labs: Recent Labs    01/31/18 1519  HGB 13.9  HCT 42.0  PLT 167  CREATININE 6.24*  TROPONINI 0.06*    Estimated Creatinine Clearance: 10.8 mL/min (A) (by C-G formula based on SCr of 6.24 mg/dL (H)).   Medical History: Past Medical History:  Diagnosis Date  . CHF (congestive heart failure) ([place])   . Heart failure ([place])   . Hyperlipidemia   . Hypertension   . Myocardial infarction ([place])   . Paroxysmal atrial fibrillation (H[facility])   . Renal failure   . Renal insufficiency   . Stroke [facility]) 2011    Assessment: Patient is a 57yo female admitted with pneumonia and PE. Patient was taking Apixaban prior to admission but was taking 10mg  at bedtime instead of 5mg  bid. Pharmacy consulted for Heparin dosing, last dose of apixaban was probably the evening of 8/18. Ordered baseline Heparin level to assess if will need to use aPTT to guide therapy.   Goal of Therapy:  Heparin level 0.3-0.7 units/ml aPTT 66 - 102 seconds Monitor platelets by anticoagulation protocol: Yes   Plan:  Give 4500 units bolus x 1 Start heparin infusion at 1300 units/hr Check anti-Xa level in 6 hours and daily while on heparin Continue to monitor H&H and platelets  Will use aPTT to guide therapy until correlates with Heparin level.  Paulina Fusi, PharmD, BCPS 01/31/2018 8:37 PM

## 2018-01-31 NOTE — H&P (Addendum)
[facility] [place] NAME: [name]    MR#:  [id number]  DATE OF BIRTH:  [birth year]  DATE OF ADMISSION:  01/31/2018  PRIMARY CARE PHYSICIAN: Patient, No Pcp Per   REQUESTING/REFERRING PHYSICIAN: Dr Charolett Bumpers  CHIEF COMPLAINT:   Chief Complaint  Patient presents with  . Chest Pain    HISTORY OF PRESENT ILLNESS:  [name]  is a 57 y.o. female presents with chest pain congestion cough and hemoptysis.  She states that symptoms have been going on since last Monday.  She has been coughing up blood-tinged sputum.  Not a lot each time.  Her chest continuously hurts like a heaviness sensation in her chest about a 5 out of 10 intensity in the center.  Lots of congestion.  She has had some fever chills and sweats.  In the ER, she was found to have a pneumonia and pulmonary embolism on CT scan of the chest.  Hospitalist services were contacted for further evaluation.  Of note, she takes Eliquis but only once a day.  PAST MEDICAL HISTORY:   Past Medical History:  Diagnosis Date  . CHF (congestive heart failure) ([place])   . Heart failure ([place])   . Hyperlipidemia   . Hypertension   . Myocardial infarction ([place])   . Paroxysmal atrial fibrillation (H[facility])   . Renal failure   . Renal insufficiency   . Stroke [facility]) 2011    PAST SURGICAL HISTORY:   Past Surgical History:  Procedure Laterality Date  . CORONARY ANGIOPLASTY WITH STENT PLACEMENT  2013  . CORONARY ARTERY BYPASS GRAFT    . DIALYSIS FISTULA CREATION      SOCIAL HISTORY:   Social History   Tobacco Use  . Smoking status: Current Every Day Smoker    Packs/day: 0.10    Years: 20.00    Pack years: 2.00    Types: Cigarettes  . Smokeless tobacco: Never Used  Substance Use Topics  . Alcohol use: No    FAMILY HISTORY:   Family History  Problem Relation Age of Onset  . Hypertension Unknown   . Cancer Unknown   . Renal Disease Unknown   . Ovarian cancer  Mother   . Kidney disease Father     DRUG ALLERGIES:   Allergies  Allergen Reactions  . Morphine And Related Hives  . Latex Rash  . Tape Rash    REVIEW OF SYSTEMS:  CONSTITUTIONAL: Positive for fever, chills and sweats.  Positive for fatigue.  Positive for poor appetite EYES: Positive for clouding of vision. EARS, NOSE, AND THROAT: No tinnitus or ear pain.  Positive for runny nose and sore throat RESPIRATORY: Positive for cough, shortness of breath, and hemoptysis.  CARDIOVASCULAR: Positive for chest pain.  GASTROINTESTINAL: Some nausea, vomiting, and abdominal pain. No blood in bowel movements.  Positive for constipation GENITOURINARY: Patient does not make urine ENDOCRINE: No polyuria, nocturia,  HEMATOLOGY: No anemia, easy bruising or bleeding SKIN: No rash or lesion. MUSCULOSKELETAL: Positive for joint pains especially in the lower extremities NEUROLOGIC: No tingling, numbness, weakness.  PSYCHIATRY: No anxiety or depression.   MEDICATIONS AT HOME:   Prior to Admission medications   Medication Sig Start Date End Date Taking? Authorizing Provider  aspirin 81 MG tablet Take 81 mg by mouth every evening.    Yes [provider]  cinacalcet (SENSIPAR) 60 MG tablet Take 60 mg by mouth every evening.    Yes [provider]  ELIQUIS 5 MG  TABS tablet Take 10 mg by mouth every evening.    Yes [provider]  hydrOXYzine (ATARAX/VISTARIL) 50 MG tablet Take 50 mg by mouth 2 (two) times daily.    Yes [provider]  pantoprazole (PROTONIX) 40 MG tablet Take 1 tablet by mouth daily. 05/07/13  Yes [provider]  sevelamer carbonate (RENVELA) 800 MG tablet Take 4 tablets (3200MG ) by mouth 3 times daily with meals and 2 tablets (1600MG ) by mouth twice daily with snacks   Yes [provider]      VITAL SIGNS:  Blood pressure (!) 100/59, pulse 83, temperature 98.4 F (36.9 C), temperature source Oral, resp. rate 18, height 5\' 6"   (1.676 m), weight 78.5 kg, SpO2 97 %.  PHYSICAL EXAMINATION:  GENERAL:  57 y.o.-year-old patient lying in the bed with no acute distress.  EYES: Pupils equal, round, reactive to light and accommodation. No scleral icterus. Extraocular muscles intact.  HEENT: Head atraumatic, normocephalic. Oropharynx and nasopharynx clear.  NECK:  Supple, no jugular venous distention. No thyroid enlargement, no tenderness.  LUNGS: Normal breath sounds bilaterally, no wheezing, rales,rhonchi or crepitation. No use of accessory muscles of respiration.  CARDIOVASCULAR: S1, S2 normal. No murmurs, rubs, or gallops.  ABDOMEN: Soft, nontender, nondistended. Bowel sounds present. No organomegaly or mass.  EXTREMITIES: No pedal edema, cyanosis, or clubbing.  Right thigh looks a little more swollen than the left thigh. NEUROLOGIC: Cranial nerves II through XII are intact. Muscle strength 5/5 in all extremities. Sensation intact. Gait not checked.  PSYCHIATRIC: The patient is alert and oriented x 3.  SKIN: No rash, lesion, or ulcer.   LABORATORY PANEL:   CBC Recent Labs  Lab 01/31/18 1519  WBC 3.8  HGB 13.9  HCT 42.0  PLT 167   ------------------------------------------------------------------------------------------------------------------  Chemistries  Recent Labs  Lab 01/31/18 1519  NA 139  K 3.7  CL 103  CO2 28  GLUCOSE 114*  BUN 20  CREATININE 6.24*  CALCIUM 7.8*   ------------------------------------------------------------------------------------------------------------------  Cardiac Enzymes Recent Labs  Lab 01/31/18 1519  TROPONINI 0.06*   ------------------------------------------------------------------------------------------------------------------  RADIOLOGY:  Dg Chest 2 View  Result Date: 01/31/2018 CLINICAL DATA:  Chest pain, shortness of breath and chest congestion EXAM: CHEST - 2 VIEW COMPARISON:  06/11/2016 FINDINGS: Previous median sternotomy and CABG. Chronically  enlarged cardiac silhouette consistent with cardiomegaly and/or pericardial fluid. Pulmonary venous hypertension with very early interstitial edema. No infiltrate, collapse or visible effusion. IMPRESSION: Congestive heart failure with venous hypertension and early interstitial edema. Enlarged cardiac silhouette, similar to the previous. Electronically Signed   By: Nelson Chimes M.D.   On: 01/31/2018 15:48   Ct Angio Chest Pe W And/or Wo Contrast  Result Date: 01/31/2018 CLINICAL DATA:  Chest pain for 3 days with congestion EXAM: CT ANGIOGRAPHY CHEST WITH CONTRAST TECHNIQUE: Multidetector CT imaging of the chest was performed using the standard protocol during bolus administration of intravenous contrast. Multiplanar CT image reconstructions and MIPs were obtained to evaluate the vascular anatomy. CONTRAST:  25mL ISOVUE-370 IOPAMIDOL (ISOVUE-370) INJECTION 76% COMPARISON:  Chest x-ray 01/31/2018, CT chest 04/11/2014 FINDINGS: Cardiovascular: Satisfactory opacification of the pulmonary arteries to the segmental level. Small nonocclusive embolus within distal segmental/subsegmental left lower lobe pulmonary artery, series 5, image number 180. No other filling defects are visualized. Nonaneurysmal aorta. Mild aortic atherosclerosis. Post CABG changes. Dilated pulmonary trunk up to 3.7 cm. Coronary vascular calcification. Marked cardiomegaly. No pericardial effusion. Mediastinum/Nodes: Midline trachea. Enlarged thyroid with multiple calcifications and nodules. Pretracheal lymph nodes measure up  to 11 mm in size. Esophagus within normal limits. Lungs/Pleura: No pleural effusion or pneumothorax. Small foci of ground-glass density within the posterior left lung base and the periphery of the right lower lobe. Upper Abdomen: No acute abnormality. Musculoskeletal: Post sternotomy changes Review of the MIP images confirms the above findings. IMPRESSION: 1. Small nonocclusive acute embolus within left lower lobe distal  segmental/subsegmental branch vessel. 2. Mild ground-glass airspace disease within the posterior left lower lobe with smaller foci of peripheral ground-glass density in the right lower lobe, felt most suspicious for infection/mild pneumonia. Doubt that the findings in left lower lobe reflect pulmonary infarct given very small size and distribution of the embolus. 3. Marked cardiomegaly. Dilated pulmonary trunk suggesting arterial hypertension 4. Enlarged heterogeneous thyroid with multiple nodules. Critical Value/emergent results were called by telephone at the time of interpretation on 01/31/2018 at 6:11 pm to Dr. Nance Pear , who verbally acknowledged these results. Aortic Atherosclerosis (ICD10-I70.0). Electronically Signed   By: Donavan Foil M.D.   On: 01/31/2018 18:11    EKG:   Normal sinus rhythm 85 bpm right atrial enlargement, right bundle branch block.  IMPRESSION AND PLAN:   1.  Pneumonia with hemoptysis.  ER physician started Rocephin and Zithromax and I will continue this.  Will send off an MRSA PCR. 2.  Pulmonary embolism.  Patient taking Eliquis at night both pills at the same time.  I explained to her that this is a twice a day medication.  The ER physician ordered heparin drip.  For right now I agree with this and she is also having hemoptysis.  We will get an ultrasound of the lower extremities. 3.  End-stage renal disease on dialysis Monday Wednesday Friday.  Had dialysis today.  Not due until Wednesday. 4.  Paroxysmal atrial fibrillation.  Currently in normal sinus rhythm.  Patient will be started on heparin drip.  Hopefully can go back on Eliquis at higher dose for 1 week upon discharge. 5.  History of CAD, cardiomyopathy and CHF.  No current signs of CHF at this time. 6.  GERD on Protonix 7.  Tobacco abuse smoking cessation counseling done 4 minutes by me.  No need for nicotine patch since he only smokes 1 cigarette every few days  All the records are reviewed and case  discussed with ED provider. Management plans discussed with the patient, family and they are in agreement.  CODE STATUS: Full code  TOTAL TIME TAKING CARE OF THIS PATIENT: 50 minutes, including ACP time.    Loletha Grayer M.D on 01/31/2018 at 7:34 PM  Between 7am to 6pm - Pager - [phone]  After 6pm call admission pager [phone]  [facility]  [phone]  CC: Primary care physician; Patient, No Pcp Per

## 2018-01-31 NOTE — Progress Notes (Signed)
Patient ID: [name], female   DOB: December 18, 1960, 57 y.o.   MRN: [id number]  ACP note  Patient present  Diagnosis: Pneumonia with hemoptysis, pulmonary embolism, end-stage renal disease on dialysis, paroxysmal atrial fibrillation, history of CAD, cardiomyopathy and systolic congestive heart failure.  CODE STATUS discussed.  Patient is a full code at this time.  Plan.  Anticoagulate with heparin.  Antibiotics for pneumonia.  Monitor clinical course.  Sonogram of the lower extremities  Time spent on ACP discussion 17 minutes Dr. Loletha Grayer

## 2018-02-01 ENCOUNTER — Inpatient Hospital Stay: Payer: Medicare Other

## 2018-02-01 LAB — APTT
aPTT: 153 seconds — ABNORMAL HIGH (ref 24–36)
aPTT: 127 seconds — ABNORMAL HIGH (ref 24–36)

## 2018-02-01 LAB — PROTIME-INR
INR: 1.58
Prothrombin Time: 18.7 seconds — ABNORMAL HIGH (ref 11.4–15.2)

## 2018-02-01 LAB — BASIC METABOLIC PANEL
Anion gap: 9 (ref 5–15)
BUN: 28 mg/dL — AB (ref 6–20)
CO2: 24 mmol/L (ref 22–32)
Calcium: 7.3 mg/dL — ABNORMAL LOW (ref 8.9–10.3)
Chloride: 104 mmol/L (ref 98–111)
Creatinine, Ser: 6.92 mg/dL — ABNORMAL HIGH (ref 0.44–1.00)
GFR calc Af Amer: 7 mL/min — ABNORMAL LOW (ref >60)
GFR, EST NON AFRICAN AMERICAN: 6 mL/min — AB (ref >60)
GLUCOSE: 87 mg/dL (ref 70–99)
POTASSIUM: 3.6 mmol/L (ref 3.5–5.1)
Sodium: 137 mmol/L (ref 135–145)

## 2018-02-01 LAB — CBC
HEMATOCRIT: 40.7 % (ref 35.0–47.0)
HEMOGLOBIN: 13.5 g/dL (ref 12.0–16.0)
MCH: 29.5 pg (ref 26.0–34.0)
MCHC: 33.2 g/dL (ref 32.0–36.0)
MCV: 88.7 fL (ref 80.0–100.0)
Platelets: 151 10*3/uL (ref 150–440)
RBC: 4.59 MIL/uL (ref 3.80–5.20)
RDW: 15.7 % — AB (ref 11.5–14.5)
WBC: 4.4 10*3/uL (ref 3.6–11.0)

## 2018-02-01 LAB — TROPONIN I
Troponin I: 0.04 ng/mL (ref <0.03)
Troponin I: 0.06 ng/mL (ref <0.03)

## 2018-02-01 LAB — HEPARIN LEVEL (UNFRACTIONATED)
HEPARIN UNFRACTIONATED: 1.67 [IU]/mL — AB (ref 0.30–0.70)
Heparin Unfractionated: 1.52 IU/mL — ABNORMAL HIGH (ref 0.30–0.70)

## 2018-02-01 LAB — MRSA PCR SCREENING: MRSA by PCR: POSITIVE — AB

## 2018-02-01 MED ORDER — APIXABAN 5 MG PO TABS
5.0000 mg | ORAL_TABLET | Freq: Two times a day (BID) | ORAL | Status: DC
  Administered 2018-02-01 – 2018-02-02 (×4): 5 mg via ORAL
  Filled 2018-02-01 (×4): qty 1

## 2018-02-01 MED ORDER — HEPARIN (PORCINE) IN NACL 100-0.45 UNIT/ML-% IJ SOLN
1200.0000 [IU]/h | INTRAMUSCULAR | Status: DC
  Administered 2018-02-01: 1200 [IU]/h via INTRAVENOUS

## 2018-02-01 MED ORDER — OXYCODONE HCL 5 MG PO TABS
5.0000 mg | ORAL_TABLET | Freq: Four times a day (QID) | ORAL | Status: DC | PRN
  Administered 2018-02-01 – 2018-02-02 (×4): 5 mg via ORAL
  Filled 2018-02-01 (×4): qty 1

## 2018-02-01 NOTE — Progress Notes (Addendum)
[facility] NAME: [name]    MR#:  [id number]  DATE OF BIRTH:  Feb 27, 1961  SUBJECTIVE:  States she still feels weak and tired. Requiring 2L O2 intermittently overnight and during the day today.  REVIEW OF SYSTEMS:  Review of Systems  Constitutional: Negative for chills and fever.  HENT: Negative for congestion and sore throat.   Respiratory: Positive for shortness of breath. Negative for cough and sputum production.   Cardiovascular: Negative for chest pain, palpitations and leg swelling.  Gastrointestinal: Negative for nausea and vomiting.  Genitourinary: Negative for dysuria and frequency.  Musculoskeletal: Negative for back pain, myalgias and neck pain.  Neurological: Negative for dizziness and headaches.  Psychiatric/Behavioral: Negative for depression. The patient is not nervous/anxious.     DRUG ALLERGIES:   Allergies  Allergen Reactions  . Morphine And Related Hives  . Latex Rash  . Tape Rash   VITALS:  Blood pressure 111/70, pulse 78, temperature 98 F (36.7 C), temperature source Oral, resp. rate 15, height 5\' 6"  (1.676 m), weight 80.5 kg, SpO2 100 %. PHYSICAL EXAMINATION:  Physical Exam  GENERAL:  57 y.o.-year-old patient lying in the bed with no acute distress.  EYES: Pupils equal, round, reactive to light and accommodation. No scleral icterus. Extraocular muscles intact.  HEENT: Head atraumatic, normocephalic. Oropharynx and nasopharynx clear.  NECK:  Supple, no jugular venous distention. No thyroid enlargement, no tenderness.  LUNGS: diminished breath sounds throughout all lung fields, no wheezing, rales, rhonchi or crepitation. No use of accessory muscles of respiration. Hoople in place. CARDIOVASCULAR: RRR, S1, S2 normal. No murmurs, rubs, or gallops.  ABDOMEN: Soft, nontender, nondistended. Bowel sounds present. No organomegaly or mass.  EXTREMITIES: No pedal edema, cyanosis, or clubbing. NEUROLOGIC:  Cranial nerves II through XII are intact. +global weakness. Sensation intact. Gait not checked.  PSYCHIATRIC: The patient is alert and oriented x 3.  SKIN: No rash, lesion, or ulcer.  LABORATORY PANEL:  Female CBC Recent Labs  Lab 02/01/18 0147  WBC 4.4  HGB 13.5  HCT 40.7  PLT 151   ------------------------------------------------------------------------------------------------------------------ Chemistries  Recent Labs  Lab 02/01/18 0147  NA 137  K 3.6  CL 104  CO2 24  GLUCOSE 87  BUN 28*  CREATININE 6.92*  CALCIUM 7.3*   RADIOLOGY:  Dg Chest 2 View  Result Date: 01/31/2018 CLINICAL DATA:  Chest pain, shortness of breath and chest congestion EXAM: CHEST - 2 VIEW COMPARISON:  06/11/2016 FINDINGS: Previous median sternotomy and CABG. Chronically enlarged cardiac silhouette consistent with cardiomegaly and/or pericardial fluid. Pulmonary venous hypertension with very early interstitial edema. No infiltrate, collapse or visible effusion. IMPRESSION: Congestive heart failure with venous hypertension and early interstitial edema. Enlarged cardiac silhouette, similar to the previous. Electronically Signed   By: Nelson Chimes M.D.   On: 01/31/2018 15:48   Ct Angio Chest Pe W And/or Wo Contrast  Result Date: 01/31/2018 CLINICAL DATA:  Chest pain for 3 days with congestion EXAM: CT ANGIOGRAPHY CHEST WITH CONTRAST TECHNIQUE: Multidetector CT imaging of the chest was performed using the standard protocol during bolus administration of intravenous contrast. Multiplanar CT image reconstructions and MIPs were obtained to evaluate the vascular anatomy. CONTRAST:  54mL ISOVUE-370 IOPAMIDOL (ISOVUE-370) INJECTION 76% COMPARISON:  Chest x-ray 01/31/2018, CT chest 04/11/2014 FINDINGS: Cardiovascular: Satisfactory opacification of the pulmonary arteries to the segmental level. Small nonocclusive embolus within distal segmental/subsegmental left lower lobe pulmonary artery, series 5, image number 180. No  other filling  defects are visualized. Nonaneurysmal aorta. Mild aortic atherosclerosis. Post CABG changes. Dilated pulmonary trunk up to 3.7 cm. Coronary vascular calcification. Marked cardiomegaly. No pericardial effusion. Mediastinum/Nodes: Midline trachea. Enlarged thyroid with multiple calcifications and nodules. Pretracheal lymph nodes measure up to 11 mm in size. Esophagus within normal limits. Lungs/Pleura: No pleural effusion or pneumothorax. Small foci of ground-glass density within the posterior left lung base and the periphery of the right lower lobe. Upper Abdomen: No acute abnormality. Musculoskeletal: Post sternotomy changes Review of the MIP images confirms the above findings. IMPRESSION: 1. Small nonocclusive acute embolus within left lower lobe distal segmental/subsegmental branch vessel. 2. Mild ground-glass airspace disease within the posterior left lower lobe with smaller foci of peripheral ground-glass density in the right lower lobe, felt most suspicious for infection/mild pneumonia. Doubt that the findings in left lower lobe reflect pulmonary infarct given very small size and distribution of the embolus. 3. Marked cardiomegaly. Dilated pulmonary trunk suggesting arterial hypertension 4. Enlarged heterogeneous thyroid with multiple nodules. Critical Value/emergent results were called by telephone at the time of interpretation on 01/31/2018 at 6:11 pm to Dr. Nance Pear , who verbally acknowledged these results. Aortic Atherosclerosis (ICD10-I70.0). Electronically Signed   By: Donavan Foil M.D.   On: 01/31/2018 18:11   US Venous Img Lower Bilateral  Result Date: 02/01/2018 CLINICAL DATA:  Possible distal left lower lobe pulmonary embolism by CTA. EXAM: BILATERAL LOWER EXTREMITY VENOUS DOPPLER ULTRASOUND TECHNIQUE: Gray-scale sonography with graded compression, as well as color Doppler and duplex ultrasound were performed to evaluate the lower extremity deep venous systems from the level  of the common femoral vein and including the common femoral, femoral, profunda femoral, popliteal and calf veins including the posterior tibial, peroneal and gastrocnemius veins when visible. The superficial great saphenous vein was also interrogated. Spectral Doppler was utilized to evaluate flow at rest and with distal augmentation maneuvers in the common femoral, femoral and popliteal veins. COMPARISON:  None. FINDINGS: RIGHT LOWER EXTREMITY Common Femoral Vein: No evidence of thrombus. Normal compressibility, respiratory phasicity and response to augmentation. Saphenofemoral Junction: No evidence of thrombus. Normal compressibility and flow on color Doppler imaging. Profunda Femoral Vein: No evidence of thrombus. Normal compressibility and flow on color Doppler imaging. Femoral Vein: No evidence of thrombus. Normal compressibility, respiratory phasicity and response to augmentation. Popliteal Vein: No evidence of thrombus. Normal compressibility, respiratory phasicity and response to augmentation. Calf Veins: No evidence of thrombus. Normal compressibility and flow on color Doppler imaging. Superficial Great Saphenous Vein: No evidence of thrombus. Normal compressibility. Venous Reflux:  None. Other Findings: No evidence of superficial thrombophlebitis or abnormal fluid collection. LEFT LOWER EXTREMITY Common Femoral Vein: No evidence of thrombus. Normal compressibility, respiratory phasicity and response to augmentation. Saphenofemoral Junction: No evidence of thrombus. Normal compressibility and flow on color Doppler imaging. Profunda Femoral Vein: No evidence of thrombus. Normal compressibility and flow on color Doppler imaging. Femoral Vein: No evidence of thrombus. Normal compressibility, respiratory phasicity and response to augmentation. Popliteal Vein: No evidence of thrombus. Normal compressibility, respiratory phasicity and response to augmentation. Calf Veins: No evidence of thrombus. Normal  compressibility and flow on color Doppler imaging. Superficial Great Saphenous Vein: No evidence of thrombus. Normal compressibility. Venous Reflux:  None. Other Findings: No evidence of superficial thrombophlebitis or abnormal fluid collection. IMPRESSION: No evidence of bilateral lower extremity deep venous thrombosis. Electronically Signed   By: Aletta Edouard M.D.   On: 02/01/2018 11:14   ASSESSMENT AND PLAN:   1.  Community-acquired pneumonia- intermittently  requiring 2L O2 by [name] - continue IV rocephin and azithromycin, plan to change to po antibiotics today - f/u blood cultures - will ambulate with pulse ox prior to discharge  2.  Concern for PE- initial radiology read stated that patient had a small nonocclusive acute embolus in the left lower lobe; however received another call from radiology this morning that this was likely over-read and is probably due to motion artifact. Do not feel that this is a PE. Patient taking Eliquis and LE dopplers are negative for DVT. - heparin drip stopped - continue home eliquis  3. Elevated troponin- likely due to demand ischemia and ESRD. No chest pain. - trend troponins  4.  End-stage renal disease- on HD MWF. Does not appear volume overloaded. - plan for HD today  5.  Paroxysmal atrial fibrillation- rate-controlled and in NSR here. - continue eliquis  6.  History of CAD, cardiomyopathy and CHF- no signs of volume overload - monitor  7.  GERD- stable - continue protonix  8.  Tobacco abuse - tobacco cessation counseling provided here - declined nicotine patch  All the records are reviewed and case discussed with Care Management/Social Worker. Management plans discussed with the patient, family and they are in agreement.  CODE STATUS: Full Code  TOTAL TIME TAKING CARE OF THIS PATIENT: 35 minutes.   More than 50% of the time was spent in counseling/coordination of care: YES  POSSIBLE D/C tomorrow, DEPENDING ON CLINICAL  CONDITION.   Berna Spare Porschia Willbanks M.D on 02/01/2018 at 3:35 PM  Between 7am to 6pm - Pager [phone]  After 6pm go to [email or web address] - Proofreader  [facility]  Office  [phone]  CC: Primary care physician; Patient, No Pcp Per  Note: This dictation was prepared with Dragon dictation along with smaller phrase technology. Any transcriptional errors that result from this process are unintentional.

## 2018-02-01 NOTE — Progress Notes (Signed)
Patient insists on taking a shower. Paged Dr Eugenia Mcalpine. Given permission to wrap IV's for a shower after Rocephin is completed.

## 2018-02-01 NOTE — Progress Notes (Signed)
ANTICOAGULATION CONSULT NOTE - Initial Consult  Pharmacy Consult for Heparin  Indication: pulmonary embolus  Allergies  Allergen Reactions  . Morphine And Related Hives  . Latex Rash  . Tape Rash    Patient Measurements: Height: 5\' 6"  (167.6 cm) Weight: 177 lb 8 oz (80.5 kg) IBW/kg (Calculated) : 59.3 Heparin Dosing Weight: 75.4  Vital Signs: Temp: 98 F (36.7 C) (08/19 2149) Temp Source: Oral (08/19 2149) BP: 148/93 (08/19 2149) Pulse Rate: 88 (08/19 2149)  Labs: Recent Labs    01/31/18 1519 01/31/18 2239 02/01/18 0147  HGB 13.9  --  13.5  HCT 42.0  --  40.7  PLT 167  --  151  APTT  --  >160* 153*  LABPROT  --  18.7*  --   INR  --  1.58  --   HEPARINUNFRC  --  1.67* 1.52*  CREATININE 6.24*  --  6.92*  TROPONINI 0.06*  --   --     Estimated Creatinine Clearance: 9.8 mL/min (A) (by C-G formula based on SCr of 6.92 mg/dL (H)).   Medical History: Past Medical History:  Diagnosis Date  . CHF (congestive heart failure) ([place])   . Heart failure ([place])   . Hyperlipidemia   . Hypertension   . Myocardial infarction ([place])   . Paroxysmal atrial fibrillation ([facility])   . Renal failure   . Renal insufficiency   . Stroke [facility]) 2011    Assessment: Patient is a 57yo female admitted with pneumonia and PE. Patient was taking Apixaban prior to admission but was taking 10mg  at bedtime instead of 5mg  bid. Pharmacy consulted for Heparin dosing, last dose of apixaban was probably the evening of 8/18. Ordered baseline Heparin level to assess if will need to use aPTT to guide therapy.   Goal of Therapy:  Heparin level 0.3-0.7 units/ml aPTT 66 - 102 seconds Monitor platelets by anticoagulation protocol: Yes   Plan:  Give 4500 units bolus x 1 Start heparin infusion at 1300 units/hr Check anti-Xa level in 6 hours and daily while on heparin Continue to monitor H&H and platelets  Will use aPTT to guide therapy until correlates with Heparin level.  8/20 0200 aPTT 153, heparin  level 1.52. Hold drip x 30 minutes and restart at 1200 units/hr. Recheck aPTT in 6 hours.  Sim Boast, PharmD, BCPS  02/01/18 2:58 AM

## 2018-02-01 NOTE — Progress Notes (Signed)
Patient Education  Counseled patient on Apixaban 5 mg BID. Patient states that she takes her doses together (10 mg in evening) for convenience due to dialysis 3 days/week. Expressed taking medication after dialysis (~1030 am) and again in the evening to separate doses.She rarely missed taking her medication daily. Patient understood. Discussed avoiding indication, NSAIDs, etc. Patient understood fully as she took this med PTA.  Paticia Stack, PharmD Pharmacy Resident  02/01/2018 3:20 PM

## 2018-02-01 NOTE — Progress Notes (Signed)
[facility]  ROUNDING NOTE   Subjective:  Patient well-known to [place] as we treated for end-stage renal disease as an outpatient. She did complete hemodialysis yesterday. Patient came in with fevers, chills, sweats. She was found to have pneumonia as well as pulmonary embolism on CT scan of the chest. Started on heparin drip.   Objective:  Vital signs in last 24 hours:  Temp:  [97.5 F (36.4 C)-98.4 F (36.9 C)] 97.5 F (36.4 C) (08/20 0624) Pulse Rate:  [79-88] 85 (08/20 0624) Resp:  [16-18] 18 (08/20 0624) BP: (100-148)/(59-99) 142/74 (08/20 0624) SpO2:  [97 %-100 %] 100 % (08/20 0624) Weight:  [78.5 kg-80.5 kg] 80.5 kg (08/19 2149)  Weight change:  Filed Weights   01/31/18 1509 01/31/18 2149  Weight: 78.5 kg 80.5 kg    Intake/Output: I/O last 3 completed shifts: In: 250 [IV Piggyback:250] Out: -    Intake/Output this shift:  No intake/output data recorded.  Physical Exam: General: No acute distress  Head: Normocephalic, atraumatic. Moist oral mucosal membranes  Eyes: Anicteric  Neck: Supple, trachea midline  Lungs:   Left basilar rales, normal effort  Heart: S1S2 no rubs  Abdomen:  Soft, nontender, bowel sounds present  Extremities: Trace peripheral edema.  Neurologic: Awake, alert, following commands  Skin: No lesions  Access: LUE AVF    Basic Metabolic Panel: Recent Labs  Lab 01/31/18 1519 02/01/18 0147  NA 139 137  K 3.7 3.6  CL 103 104  CO2 28 24  GLUCOSE 114* 87  BUN 20 28*  CREATININE 6.24* 6.92*  CALCIUM 7.8* 7.3*    Liver Function Tests: No results for input(s): AST, ALT, ALKPHOS, BILITOT, PROT, ALBUMIN in the last 168 hours. No results for input(s): LIPASE, AMYLASE in the last 168 hours. No results for input(s): AMMONIA in the last 168 hours.  CBC: Recent Labs  Lab 01/31/18 1519 02/01/18 0147  WBC 3.8 4.4  HGB 13.9 13.5  HCT 42.0 40.7  MCV 88.7 88.7  PLT 167 151    Cardiac Enzymes: Recent Labs  Lab  01/31/18 1519  TROPONINI 0.06*    BNP: Invalid input(s): POCBNP  CBG: No results for input(s): GLUCAP in the last 168 hours.  Microbiology: Results for orders placed or performed during the hospital encounter of 01/31/18  MRSA PCR Screening     Status: Abnormal   Collection Time: 01/31/18 10:29 PM  Result Value Ref Range Status   MRSA by PCR POSITIVE (A) NEGATIVE Final    Comment:        The GeneXpert MRSA Assay (FDA approved for NASAL specimens only), is one component of a comprehensive MRSA colonization surveillance program. It is not intended to diagnose MRSA infection nor to guide or monitor treatment for MRSA infections. CRITICAL RESULT CALLED TO, READ BACK BY AND VERIFIED WITH: C/ KIRSTEN JOYNER @0055  02/01/18 [facility] Performed at [facility], [address] [place], [place] [address]   Blood culture (routine x 2)     Status: None (Preliminary result)   Collection Time: 01/31/18 10:39 PM  Result Value Ref Range Status   Specimen Description BLOOD RIGHT WRIST  Final   Special Requests   Final    BOTTLES DRAWN AEROBIC AND ANAEROBIC Blood Culture adequate volume   Culture   Final    NO GROWTH < 12 HOURS Performed at [facility], [address], [place], [place] [address]    Report Status PENDING  Incomplete  Blood culture (routine x 2)  Status: None (Preliminary result)   Collection Time: 01/31/18 10:58 PM  Result Value Ref Range Status   Specimen Description BLOOD RIGHT HAND  Final   Special Requests   Final    BOTTLES DRAWN AEROBIC AND ANAEROBIC Blood Culture adequate volume   Culture   Final    NO GROWTH < 12 HOURS Performed at [facility], [address] [place], [place] [address]    Report Status PENDING  Incomplete    Coagulation Studies: Recent Labs    01/31/18 2239  LABPROT 18.7*  INR 1.58    Urinalysis: No results for input(s): COLORURINE, LABSPEC, PHURINE, GLUCOSEU, HGBUR, BILIRUBINUR, KETONESUR, PROTEINUR,  UROBILINOGEN, NITRITE, LEUKOCYTESUR in the last 72 hours.  Invalid input(s): APPERANCEUR    Imaging: Dg Chest 2 View  Result Date: 01/31/2018 CLINICAL DATA:  Chest pain, shortness of breath and chest congestion EXAM: CHEST - 2 VIEW COMPARISON:  06/11/2016 FINDINGS: Previous median sternotomy and CABG. Chronically enlarged cardiac silhouette consistent with cardiomegaly and/or pericardial fluid. Pulmonary venous hypertension with very early interstitial edema. No infiltrate, collapse or visible effusion. IMPRESSION: Congestive heart failure with venous hypertension and early interstitial edema. Enlarged cardiac silhouette, similar to the previous. Electronically Signed   By: Nelson Chimes M.D.   On: 01/31/2018 15:48   Ct Angio Chest Pe W And/or Wo Contrast  Result Date: 01/31/2018 CLINICAL DATA:  Chest pain for 3 days with congestion EXAM: CT ANGIOGRAPHY CHEST WITH CONTRAST TECHNIQUE: Multidetector CT imaging of the chest was performed using the standard protocol during bolus administration of intravenous contrast. Multiplanar CT image reconstructions and MIPs were obtained to evaluate the vascular anatomy. CONTRAST:  34mL ISOVUE-370 IOPAMIDOL (ISOVUE-370) INJECTION 76% COMPARISON:  Chest x-ray 01/31/2018, CT chest 04/11/2014 FINDINGS: Cardiovascular: Satisfactory opacification of the pulmonary arteries to the segmental level. Small nonocclusive embolus within distal segmental/subsegmental left lower lobe pulmonary artery, series 5, image number 180. No other filling defects are visualized. Nonaneurysmal aorta. Mild aortic atherosclerosis. Post CABG changes. Dilated pulmonary trunk up to 3.7 cm. Coronary vascular calcification. Marked cardiomegaly. No pericardial effusion. Mediastinum/Nodes: Midline trachea. Enlarged thyroid with multiple calcifications and nodules. Pretracheal lymph nodes measure up to 11 mm in size. Esophagus within normal limits. Lungs/Pleura: No pleural effusion or pneumothorax. Small  foci of ground-glass density within the posterior left lung base and the periphery of the right lower lobe. Upper Abdomen: No acute abnormality. Musculoskeletal: Post sternotomy changes Review of the MIP images confirms the above findings. IMPRESSION: 1. Small nonocclusive acute embolus within left lower lobe distal segmental/subsegmental branch vessel. 2. Mild ground-glass airspace disease within the posterior left lower lobe with smaller foci of peripheral ground-glass density in the right lower lobe, felt most suspicious for infection/mild pneumonia. Doubt that the findings in left lower lobe reflect pulmonary infarct given very small size and distribution of the embolus. 3. Marked cardiomegaly. Dilated pulmonary trunk suggesting arterial hypertension 4. Enlarged heterogeneous thyroid with multiple nodules. Critical Value/emergent results were called by telephone at the time of interpretation on 01/31/2018 at 6:11 pm to Dr. Nance Pear , who verbally acknowledged these results. Aortic Atherosclerosis (ICD10-I70.0). Electronically Signed   By: Donavan Foil M.D.   On: 01/31/2018 18:11     Medications:   . cefTRIAXone (ROCEPHIN)  IV    . heparin 1,200 Units/hr (02/01/18 0335)   . azithromycin  250 mg Oral Daily  . cinacalcet  60 mg Oral QPM  . hydrOXYzine  50 mg Oral BID  . pantoprazole  40 mg Oral Daily  .  sevelamer carbonate  3,200 mg Oral TID WC   acetaminophen **OR** acetaminophen, HYDROmorphone (DILAUDID) injection, ondansetron **OR** ondansetron (ZOFRAN) IV  Assessment/ Plan:  57 y.o. female with past medical history ESRD on HD MWF, hypertension, congestive heart failure, anemia of CKD, SHPTH, myocardial infarction, hx of CVA, prior episodes of hyperkalemia, admitted now with pneumonia and pulmonary embolism.   [facility]/MWF  1.  ESRD on HD MWF.  Patient did complete hemodialysis yesterday.  No acute indication for dialysis today.  We will plan for dialysis again tomorrow.  2.   Bacterial pneumonia/pulmonary embolism.  Noted on CT scan of the chest.  Patient started on ceftriaxone and azithromycin.  She is also on heparin drip.  Further management as per hospitalist.  3.  Secondary hyperparathyroidism.  Maintain the patient on Renvela 3200 mg p.o. 3 times daily with meals as well as cinacalcet 60 mg p.o. daily.    LOS: 1 Jaycee Mckellips 8/20/201910:19 AM

## 2018-02-01 NOTE — Consult Note (Addendum)
ANTICOAGULATION CONSULT NOTE - Initial Consult  Pharmacy Consult for Apixaban Indication: atrial fibrillation  Allergies  Allergen Reactions  . Morphine And Related Hives  . Latex Rash  . Tape Rash    Patient Measurements: Height: 5\' 6"  (167.6 cm) Weight: 177 lb 8 oz (80.5 kg) IBW/kg (Calculated) : 59.3   Vital Signs: Temp: 98 F (36.7 C) (08/20 1225) Temp Source: Oral (08/20 1225) BP: 111/70 (08/20 1225) Pulse Rate: 78 (08/20 1225)  Labs: Recent Labs    01/31/18 1519 01/31/18 2239 02/01/18 0147 02/01/18 1129  HGB 13.9  --  13.5  --   HCT 42.0  --  40.7  --   PLT 167  --  151  --   APTT  --  >160* 153* 127*  LABPROT  --  18.7*  --   --   INR  --  1.58  --   --   HEPARINUNFRC  --  1.67* 1.52*  --   CREATININE 6.24*  --  6.92*  --   TROPONINI 0.06*  --   --   --     Estimated Creatinine Clearance: 9.8 mL/min (A) (by C-G formula based on SCr of 6.92 mg/dL (H)).   Medical History: Past Medical History:  Diagnosis Date  . CHF (congestive heart failure) ([place])   . Heart failure ([place])   . Hyperlipidemia   . Hypertension   . Myocardial infarction ([place])   . Paroxysmal atrial fibrillation ([facility])   . Renal failure   . Renal insufficiency   . Stroke [facility]) 2011     Assessment: Patient presented with hemoptysis, chest pain, and cough. Was originally treated for suspected PE with heparin drip, but is not PE per radiology follow-up.   Plan:  Resume home dose Apixaban 5 mg BID for nonvalvular afib. Will counsel patient on proper dosing, side effects, etc.   Paticia Stack, PharmD Pharmacy Resident  02/01/2018 1:30 PM

## 2018-02-02 LAB — CBC
HEMATOCRIT: 42 % (ref 35.0–47.0)
Hemoglobin: 13.6 g/dL (ref 12.0–16.0)
MCH: 28.9 pg (ref 26.0–34.0)
MCHC: 32.3 g/dL (ref 32.0–36.0)
MCV: 89.5 fL (ref 80.0–100.0)
Platelets: 158 10*3/uL (ref 150–440)
RBC: 4.69 MIL/uL (ref 3.80–5.20)
RDW: 15.6 % — ABNORMAL HIGH (ref 11.5–14.5)
WBC: 4.2 10*3/uL (ref 3.6–11.0)

## 2018-02-02 LAB — BASIC METABOLIC PANEL
ANION GAP: 11 (ref 5–15)
BUN: 41 mg/dL — ABNORMAL HIGH (ref 6–20)
CALCIUM: 7.4 mg/dL — AB (ref 8.9–10.3)
CHLORIDE: 100 mmol/L (ref 98–111)
CO2: 26 mmol/L (ref 22–32)
Creatinine, Ser: 8.66 mg/dL — ABNORMAL HIGH (ref 0.44–1.00)
GFR calc non Af Amer: 5 mL/min — ABNORMAL LOW (ref >60)
GFR, EST AFRICAN AMERICAN: 5 mL/min — AB (ref >60)
Glucose, Bld: 99 mg/dL (ref 70–99)
Potassium: 4.3 mmol/L (ref 3.5–5.1)
Sodium: 137 mmol/L (ref 135–145)

## 2018-02-02 LAB — HIV ANTIBODY (ROUTINE TESTING W REFLEX): HIV SCREEN 4TH GENERATION: NONREACTIVE

## 2018-02-02 LAB — PHOSPHORUS: PHOSPHORUS: 3.9 mg/dL (ref 2.5–4.6)

## 2018-02-02 MED ORDER — ELIQUIS 5 MG PO TABS: 5.0000 mg | ORAL_TABLET | Freq: Two times a day (BID) | ORAL | 0 refills | Status: DC

## 2018-02-02 MED ORDER — MUPIROCIN 2 % EX OINT
1.0000 "application " | TOPICAL_OINTMENT | Freq: Two times a day (BID) | CUTANEOUS | Status: DC
  Filled 2018-02-02: qty 22

## 2018-02-02 MED ORDER — PENTAFLUOROPROP-TETRAFLUOROETH EX AERO
1.0000 "application " | INHALATION_SPRAY | CUTANEOUS | Status: DC | PRN
  Filled 2018-02-02: qty 30

## 2018-02-02 MED ORDER — SODIUM CHLORIDE 0.9 % IV SOLN: 100.0000 mL | INTRAVENOUS | Status: DC | PRN

## 2018-02-02 MED ORDER — ALTEPLASE 2 MG IJ SOLR: 2.0000 mg | Freq: Once | INTRAMUSCULAR | Status: DC | PRN

## 2018-02-02 MED ORDER — CEFDINIR 300 MG PO CAPS: ORAL_CAPSULE | ORAL | 0 refills | Status: DC

## 2018-02-02 MED ORDER — OXYCODONE HCL 5 MG PO TABS: 5.0000 mg | ORAL_TABLET | ORAL | 0 refills | Status: DC | PRN

## 2018-02-02 MED ORDER — OXYCODONE HCL 5 MG PO TABS: 5.0000 mg | ORAL_TABLET | Freq: Four times a day (QID) | ORAL | 0 refills | Status: DC | PRN

## 2018-02-02 MED ORDER — CHLORHEXIDINE GLUCONATE CLOTH 2 % EX PADS
6.0000 | MEDICATED_PAD | Freq: Every day | CUTANEOUS | Status: DC
  Administered 2018-02-02: 6 via TOPICAL

## 2018-02-02 MED ORDER — CHLORHEXIDINE GLUCONATE CLOTH 2 % EX PADS: 6.0000 | MEDICATED_PAD | Freq: Every day | CUTANEOUS | Status: DC

## 2018-02-02 MED ORDER — LIDOCAINE HCL (PF) 1 % IJ SOLN
5.0000 mL | INTRAMUSCULAR | Status: DC | PRN
  Filled 2018-02-02: qty 5

## 2018-02-02 MED ORDER — CEFDINIR 300 MG PO CAPS
300.0000 mg | ORAL_CAPSULE | ORAL | Status: DC
  Filled 2018-02-02 (×2): qty 1

## 2018-02-02 MED ORDER — AZITHROMYCIN 250 MG PO TABS: 250.0000 mg | ORAL_TABLET | Freq: Every day | ORAL | 0 refills | Status: AC

## 2018-02-02 MED ORDER — LIDOCAINE-PRILOCAINE 2.5-2.5 % EX CREA
1.0000 "application " | TOPICAL_CREAM | CUTANEOUS | Status: DC | PRN
  Filled 2018-02-02: qty 5

## 2018-02-02 MED ORDER — GUAIFENESIN 100 MG/5ML PO SOLN
5.0000 mL | ORAL | Status: DC | PRN
  Administered 2018-02-02: 100 mg via ORAL
  Filled 2018-02-02 (×2): qty 5

## 2018-02-02 MED ORDER — HEPARIN SODIUM (PORCINE) 1000 UNIT/ML DIALYSIS
1000.0000 [IU] | INTRAMUSCULAR | Status: DC | PRN
  Filled 2018-02-02: qty 1

## 2018-02-02 NOTE — Discharge Summary (Signed)
[facility] NAME: [name]    MR#:  [id number]  DATE OF BIRTH:  June 28, 1960  DATE OF ADMISSION:  01/31/2018   ADMITTING PHYSICIAN: Loletha Grayer, MD  DATE OF DISCHARGE: 02/02/18  PRIMARY CARE PHYSICIAN: Patient, No Pcp Per   ADMISSION DIAGNOSIS:  Leg swelling [M79.89] Hemoptysis [R04.2] Nonspecific chest pain [R07.9] Pneumonia due to infectious organism, unspecified laterality, unspecified part of lung [J18.9] Acute pulmonary embolism without acute cor pulmonale, unspecified pulmonary embolism type ([place]) [I26.99] DISCHARGE DIAGNOSIS:  Active Problems:   Pneumonia  SECONDARY DIAGNOSIS:   Past Medical History:  Diagnosis Date  . CHF (congestive heart failure) ([place])   . Heart failure ([place])   . Hyperlipidemia   . Hypertension   . Myocardial infarction ([place])   . Paroxysmal atrial fibrillation (H[facility])   . Renal failure   . Renal insufficiency   . Stroke [facility]) 2011   HOSPITAL COURSE:   Lindell is a 57 year old female with a history of hypertension, paroxysmal atrial fibrillation, combined CHF, hyperlipidemia, history MI, history of stroke who presented to the ED with cough, congestion, and hemoptysis.  In the ED, she had a CTA of the chest that showed pneumonia and pulmonary embolism.  She was admitted for further management.  1. Community-acquired pneumonia- improved -Initially, required 2 L of oxygen by nasal cannula.  Able to be weaned to room air.  Ambulated with pulse ox on day of discharge and maintain oxygen saturations greater than 97%. -Initially treated with IV rocephin and azithromycin, changed to po omnicef and azithromycin on the day of discharge for a total 5 day course. - blood cultures negative  2. Concern for PE - Initial radiology read stated that patient had a small nonocclusive acute embolus in the left lower lobe; however received another call from radiology stating that this was likely  over-read and is probably due to motion artifact. Do not feel that this is a PE. Patient taking Eliquis and LE dopplers are negative for DVT. -Initially on heparin drip, but this was stopped -Continued home eliquis.  Patient initially taking 10 mg daily, and was counseled that she should take 5 mg twice daily.  3. Elevated troponin- likely due to demand ischemia and ESRD. No chest pain.  4. End-stage renal disease- on HD MWF. Did not appear volume overloaded. -Received HD while hospitalized  5. Paroxysmal atrial fibrillation- rate-controlled and in NSR here. - continued eliquis  6. History of CAD, cardiomyopathy and combined CHF- no signs of volume overload.  7. GERD- stable - continued protonix  8. Tobacco abuse - tobacco cessation counseling provided here - declined nicotine patch  DISCHARGE CONDITIONS:  Community-acquired pneumonia, improved ESRD on dialysis Paroxysmal A. fib CAD Combined CHF GERD Tobacco abuse CONSULTS OBTAINED:  Treatment Team:  Anthonette Legato, MD DRUG ALLERGIES:   Allergies  Allergen Reactions  . Morphine And Related Hives  . Latex Rash  . Tape Rash   DISCHARGE MEDICATIONS:   Allergies as of 02/02/2018      Reactions   Morphine And Related Hives   Latex Rash   Tape Rash      Medication List    TAKE these medications   aspirin 81 MG tablet Take 81 mg by mouth every evening.   azithromycin 250 MG tablet Commonly known as:  ZITHROMAX Take 1 tablet (250 mg total) by mouth daily for 3 days.   cefdinir 300 MG capsule Commonly known as:  OMNICEF Take 1 tablet  on 8/23 after dialysis   ELIQUIS 5 MG Tabs tablet Generic drug:  apixaban Take 1 tablet (5 mg total) by mouth 2 (two) times daily. What changed:    how much to take  when to take this   hydrOXYzine 50 MG tablet Commonly known as:  ATARAX/VISTARIL Take 50 mg by mouth 2 (two) times daily.   pantoprazole 40 MG tablet Commonly known as:  PROTONIX Take 1 tablet  by mouth daily.   SENSIPAR 60 MG tablet Generic drug:  cinacalcet Take 60 mg by mouth every evening.   sevelamer carbonate 800 MG tablet Commonly known as:  RENVELA Take 4 tablets (3200MG ) by mouth 3 times daily with meals and 2 tablets (1600MG ) by mouth twice daily with snacks        DISCHARGE INSTRUCTIONS:  1.  Follow-up with PCP in 1 to 2 weeks 2.  Patient would benefit from continued tobacco cessation education 3.  Discharge home on a 5-day course of Omnicef and azithromycin 4.  Patient was taking Eliquis 10 mg daily, and was counseled to start taking 5 mg twice daily DIET:  Renal diet DISCHARGE CONDITION:  Stable ACTIVITY:  Activity as tolerated OXYGEN:  Home Oxygen: No.  Oxygen Delivery: room air DISCHARGE LOCATION:  home   If you experience worsening of your admission symptoms, develop shortness of breath, life threatening emergency, suicidal or homicidal thoughts you must seek medical attention immediately by calling 911 or calling your MD immediately  if symptoms less severe.  You Must read complete instructions/literature along with all the possible adverse reactions/side effects for all the Medicines you take and that have been prescribed to you. Take any new Medicines after you have completely understood and accpet all the possible adverse reactions/side effects.   Please note  You were cared for by a hospitalist during your hospital stay. If you have any questions about your discharge medications or the care you received while you were in the hospital after you are discharged, you can call the unit and asked to speak with the hospitalist on call if the hospitalist that took care of you is not available. Once you are discharged, your primary care physician will handle any further medical issues. Please note that NO REFILLS for any discharge medications will be authorized once you are discharged, as it is imperative that you return to your primary care physician (or  establish a relationship with a primary care physician if you do not have one) for your aftercare needs so that they can reassess your need for medications and monitor your lab values.    On the day of Discharge:  VITAL SIGNS:  Blood pressure 129/79, pulse 80, temperature 97.7 F (36.5 C), temperature source Oral, resp. rate 18, height 5\' 6"  (1.676 m), weight 80.5 kg, SpO2 95 %. PHYSICAL EXAMINATION:  GENERAL:  57 y.o.-year-old patient lying in the bed with no acute distress.  EYES: Pupils equal, round, reactive to light and accommodation. No scleral icterus. Extraocular muscles intact.  HEENT: Head atraumatic, normocephalic. Oropharynx and nasopharynx clear.  NECK:  Supple, no jugular venous distention. No thyroid enlargement, no tenderness.  LUNGS: Diminished breath sounds throughout, no wheezing, rales,rhonchi or crepitation. No use of accessory muscles of respiration.  CARDIOVASCULAR: S1, S2 normal. No murmurs, rubs, or gallops.  ABDOMEN: Soft, non-tender, non-distended. Bowel sounds present. No organomegaly or mass.  EXTREMITIES: No pedal edema, cyanosis, or clubbing.  NEUROLOGIC: Cranial nerves II through XII are intact. Muscle strength 5/5 in all extremities. Sensation intact.  Gait not checked.  PSYCHIATRIC: The patient is alert and oriented x 3.  SKIN: No obvious rash, lesion, or ulcer.  DATA REVIEW:   CBC Recent Labs  Lab 02/02/18 0350  WBC 4.2  HGB 13.6  HCT 42.0  PLT 158    Chemistries  Recent Labs  Lab 02/02/18 0350  NA 137  K 4.3  CL 100  CO2 26  GLUCOSE 99  BUN 41*  CREATININE 8.66*  CALCIUM 7.4*     Microbiology Results  Results for orders placed or performed during the hospital encounter of 01/31/18  MRSA PCR Screening     Status: Abnormal   Collection Time: 01/31/18 10:29 PM  Result Value Ref Range Status   MRSA by PCR POSITIVE (A) NEGATIVE Final    Comment:        The GeneXpert MRSA Assay (FDA approved for NASAL specimens only), is one  component of a comprehensive MRSA colonization surveillance program. It is not intended to diagnose MRSA infection nor to guide or monitor treatment for MRSA infections. CRITICAL RESULT CALLED TO, READ BACK BY AND VERIFIED WITH: C/ KIRSTEN JOYNER @0055  02/01/18 [facility] Performed at [facility], [address] [place], [place] [address]   Blood culture (routine x 2)     Status: None (Preliminary result)   Collection Time: 01/31/18 10:39 PM  Result Value Ref Range Status   Specimen Description BLOOD RIGHT WRIST  Final   Special Requests   Final    BOTTLES DRAWN AEROBIC AND ANAEROBIC Blood Culture adequate volume   Culture   Final    NO GROWTH 2 DAYS Performed at [facility], [address] [place], [place] [address]    Report Status PENDING  Incomplete  Blood culture (routine x 2)     Status: None (Preliminary result)   Collection Time: 01/31/18 10:58 PM  Result Value Ref Range Status   Specimen Description BLOOD RIGHT HAND  Final   Special Requests   Final    BOTTLES DRAWN AEROBIC AND ANAEROBIC Blood Culture adequate volume   Culture   Final    NO GROWTH 2 DAYS Performed at [facility], [address] [place], [place] [address]    Report Status PENDING  Incomplete    RADIOLOGY:  US Venous Img Lower Bilateral  Result Date: 02/01/2018 CLINICAL DATA:  Possible distal left lower lobe pulmonary embolism by CTA. EXAM: BILATERAL LOWER EXTREMITY VENOUS DOPPLER ULTRASOUND TECHNIQUE: Gray-scale sonography with graded compression, as well as color Doppler and duplex ultrasound were performed to evaluate the lower extremity deep venous systems from the level of the common femoral vein and including the common femoral, femoral, profunda femoral, popliteal and calf veins including the posterior tibial, peroneal and gastrocnemius veins when visible. The superficial great saphenous vein was also interrogated. Spectral Doppler was utilized to evaluate flow at rest  and with distal augmentation maneuvers in the common femoral, femoral and popliteal veins. COMPARISON:  None. FINDINGS: RIGHT LOWER EXTREMITY Common Femoral Vein: No evidence of thrombus. Normal compressibility, respiratory phasicity and response to augmentation. Saphenofemoral Junction: No evidence of thrombus. Normal compressibility and flow on color Doppler imaging. Profunda Femoral Vein: No evidence of thrombus. Normal compressibility and flow on color Doppler imaging. Femoral Vein: No evidence of thrombus. Normal compressibility, respiratory phasicity and response to augmentation. Popliteal Vein: No evidence of thrombus. Normal compressibility, respiratory phasicity and response to augmentation. Calf Veins: No evidence of thrombus. Normal compressibility and flow on color Doppler imaging. Superficial Great Saphenous Vein:  No evidence of thrombus. Normal compressibility. Venous Reflux:  None. Other Findings: No evidence of superficial thrombophlebitis or abnormal fluid collection. LEFT LOWER EXTREMITY Common Femoral Vein: No evidence of thrombus. Normal compressibility, respiratory phasicity and response to augmentation. Saphenofemoral Junction: No evidence of thrombus. Normal compressibility and flow on color Doppler imaging. Profunda Femoral Vein: No evidence of thrombus. Normal compressibility and flow on color Doppler imaging. Femoral Vein: No evidence of thrombus. Normal compressibility, respiratory phasicity and response to augmentation. Popliteal Vein: No evidence of thrombus. Normal compressibility, respiratory phasicity and response to augmentation. Calf Veins: No evidence of thrombus. Normal compressibility and flow on color Doppler imaging. Superficial Great Saphenous Vein: No evidence of thrombus. Normal compressibility. Venous Reflux:  None. Other Findings: No evidence of superficial thrombophlebitis or abnormal fluid collection. IMPRESSION: No evidence of bilateral lower extremity deep venous  thrombosis. Electronically Signed   By: Aletta Edouard M.D.   On: 02/01/2018 11:14     Management plans discussed with the patient, family and they are in agreement.  CODE STATUS: Full Code   TOTAL TIME TAKING CARE OF THIS PATIENT: 32 minutes.    Berna Spare Mayo M.D on 02/02/2018 at 8:45 AM  Between 7am to 6pm - Pager - [phone]  After 6pm go to [email or web address] - Proofreader  [facility]  Office  [phone]  CC: Primary care physician; Patient, No Pcp Per   Note: This dictation was prepared with Dragon dictation along with smaller phrase technology. Any transcriptional errors that result from this process are unintentional.

## 2018-02-02 NOTE — Progress Notes (Signed)
Pre HD Treatment    02/02/18 1545  Vital Signs  Temp 98.7 F (37.1 C)  Temp Source Oral  Pulse Rate 79  Pulse Rate Source Monitor  Resp 18  BP (!) 123/98  BP Location Left Arm (per pt)  BP Method Automatic  Patient Position (if appropriate) Lying  Oxygen Therapy  SpO2 98 %  O2 Device Room Air  Pain Assessment  Pain Scale 0-10  Pain Score 3 (she states her pain is improving)  POSS Scale ([place]ero Opioid Sedation Scale)  POSS *See Group Information* 1-Acceptable,Awake and alert  Dialysis Weight  Weight 84.4 kg  Type of Weight Pre-Dialysis  Time-Out for Hemodialysis  What Procedure? HD  Pt Identifiers(min of two) First/Last Name;MRN/Account#;Pt's DOB(use if MRN/Acct# not available  Correct Site? Yes  Correct Side? Yes  Correct Procedure? Yes  Consents Verified? Yes  Rad Studies Available? N/A  Safety Precautions Reviewed? Yes  Engineer, civil (consulting) Number  [phone])  Station Number 1  UF/Alarm Test Passed  Conductivity: Meter 13.8  Conductivity: Machine  13.6  pH 7.4  Reverse Osmosis Main  Normal Saline Lot Number [id number]  Dialyzer Lot Number [id number]  Disposable Set Lot Number [id number]  Machine Temperature 98.6 F (37 C)  Musician and Audible Yes  Blood Lines Intact and Secured Yes  Pre Treatment Patient Checks  Vascular access used during treatment Graft  Hepatitis B Surface Antigen Results Negative  Date Hepatitis B Surface Antigen Drawn 07/13/16  Hepatitis B Surface Antibody  (greater than 10)  Date Hepatitis B Surface Antibody Drawn 06/02/17  Hemodialysis Consent Verified Yes  Hemodialysis Standing Orders Initiated Yes  ECG (Telemetry) Monitor On Yes  Prime Ordered Normal Saline  Length of  DialysisTreatment -hour(s) 3.5 Hour(s)  Dialyzer Elisio 17H NR  Dialysate 2K, 2.5 Ca  Dialysis Anticoagulant None  Dialysate Flow Ordered 800  Blood Flow Rate Ordered 350 mL/min (pt unable to tolerate 400 she states)  Ultrafiltration Goal 2.5 Liters   Pre Treatment Labs Phosphorus  Dialysis Blood Pressure Support Ordered Normal Saline  Education / Care Plan  Dialysis Education Provided Yes  Documented Education in Care Plan Yes  Fistula / Graft Left Upper arm Arteriovenous fistula  No Placement Date or Time found.   Placed prior to admission: Yes  Orientation: Left  Access Location: Upper arm  Access Type: Arteriovenous fistula  Site Condition No complications  Fistula / Graft Assessment Present;Thrill;Bruit  Drainage Description None

## 2018-02-02 NOTE — Progress Notes (Signed)
SATURATION QUALIFICATIONS:   Patient Saturations on Room Air at Rest = 99%  Patient Saturations on [employer] while Ambulating = 97%   [name] [employer]

## 2018-02-02 NOTE — Progress Notes (Deleted)
Pre HD Treatment    02/02/18 1545  Vital Signs  Temp 98.7 F (37.1 C)  Temp Source Oral  Pulse Rate 79  Pulse Rate Source Monitor  Resp 18  BP (!) 123/98  BP Location Left Arm (per pt)  BP Method Automatic  Patient Position (if appropriate) Lying  Oxygen Therapy  SpO2 98 %  O2 Device Room Air  Pain Assessment  Pain Scale 0-10  Pain Score 3 (she states her pain is improving)  POSS Scale ([place] Opioid Sedation Scale)  POSS *See Group Information* 1-Acceptable,Awake and alert  Dialysis Weight  Weight 84.4 kg  Type of Weight Pre-Dialysis  Time-Out for Hemodialysis  What Procedure? HD  Pt Identifiers(min of two) First/Last Name;MRN/Account#;Pt's DOB(use if MRN/Acct# not available  Correct Site? Yes  Correct Side? Yes  Correct Procedure? Yes  Consents Verified? Yes  Rad Studies Available? N/A  Safety Precautions Reviewed? Yes  Engineer, civil (consulting) Number  [phone])  Station Number 1  UF/Alarm Test Passed  Conductivity: Meter 13.8  Conductivity: Machine  13.6  pH 7.4  Reverse Osmosis Main  Normal Saline Lot Number [id number]  Dialyzer Lot Number [id number]  Disposable Set Lot Number [id number]  Machine Temperature 98.6 F (37 C)  Musician and Audible Yes  Blood Lines Intact and Secured Yes  Pre Treatment Patient Checks  Vascular access used during treatment Graft  Hepatitis B Surface Antigen Results Negative  Date Hepatitis B Surface Antigen Drawn 07/13/16  Hepatitis B Surface Antibody  (greater than 10)  Date Hepatitis B Surface Antibody Drawn 06/02/17  Hemodialysis Consent Verified Yes  Hemodialysis Standing Orders Initiated Yes  ECG (Telemetry) Monitor On Yes  Prime Ordered Normal Saline  Length of  DialysisTreatment -hour(s) 3.5 Hour(s)  Dialyzer Elisio 17H NR  Dialysate 2K, 2.5 Ca  Dialysis Anticoagulant None  Dialysate Flow Ordered 800  Blood Flow Rate Ordered 350 mL/min (pt unable to tolerate 400 she states)  Ultrafiltration Goal 2.5 Liters   Pre Treatment Labs Phosphorus  Dialysis Blood Pressure Support Ordered Normal Saline

## 2018-02-02 NOTE — Progress Notes (Signed)
Pre HD Assessment    02/02/18 1550  Neurological  Level of Consciousness Alert  Orientation Level Oriented X4  Respiratory  Respiratory Pattern Regular;Unlabored;Symmetrical  Chest Assessment Chest expansion symmetrical  Bilateral Breath Sounds Diminished  Cough Weak  Cardiac  Pulse Regular  Heart Sounds S1, S2  Jugular Venous Distention (JVD) No  ECG Monitor Yes  Cardiac Rhythm NSR  Ectopy Multifocal PVC's  Ectopy Frequency Frequent  Antiarrhythmic device No  Vascular  R Radial Pulse +2  L Radial Pulse +2  Edema Left lower extremity;Right lower extremity  RLE Edema Non-pitting  LLE Edema Non-Pitting  Integumentary  Integumentary (WDL) WDL  Musculoskeletal  Musculoskeletal (WDL) WDL  GU Assessment  Genitourinary (WDL) X (HD pt)  Psychosocial  Psychosocial (WDL) WDL

## 2018-02-02 NOTE — Progress Notes (Signed)
Patient discharge teaching given, including activity, diet, follow-up appoints, and medications. Patient verbalized understanding of all discharge instructions. IV access was d/c'd. Vitals are stable. Skin is intact except as charted in most recent assessments. Pt to be escorted out by NT, to be driven home by family. 

## 2018-02-02 NOTE — Progress Notes (Signed)
[facility]  ROUNDING NOTE   Subjective:  Patient due for hemodialysis today. Orders have been prepared. Resting comfortably at the moment.   Objective:  Vital signs in last 24 hours:  Temp:  [97.7 F (36.5 C)-98.3 F (36.8 C)] 97.9 F (36.6 C) (08/21 0940) Pulse Rate:  [78-84] 84 (08/21 0940) Resp:  [15-20] 18 (08/21 0940) BP: (111-144)/(70-94) 111/70 (08/21 0940) SpO2:  [92 %-100 %] 96 % (08/21 0940)  Weight change:  Filed Weights   01/31/18 1509 01/31/18 2149  Weight: 78.5 kg 80.5 kg    Intake/Output: I/O last 3 completed shifts: In: 68 [P.O.:236; IV Piggyback:250] Out: 0    Intake/Output this shift:  No intake/output data recorded.  Physical Exam: General: No acute distress  Head: Normocephalic, atraumatic. Moist oral mucosal membranes  Eyes: Anicteric  Neck: Supple, trachea midline  Lungs:  Left basilar rales, normal effort  Heart: S1S2 no rubs  Abdomen:  Soft, nontender, bowel sounds present  Extremities: Trace peripheral edema.  Neurologic: Awake, alert, following commands  Skin: No lesions  Access: LUE AVF    Basic Metabolic Panel: Recent Labs  Lab 01/31/18 1519 02/01/18 0147 02/02/18 0350  NA 139 137 137  K 3.7 3.6 4.3  CL 103 104 100  CO2 28 24 26   GLUCOSE 114* 87 99  BUN 20 28* 41*  CREATININE 6.24* 6.92* 8.66*  CALCIUM 7.8* 7.3* 7.4*    Liver Function Tests: No results for input(s): AST, ALT, ALKPHOS, BILITOT, PROT, ALBUMIN in the last 168 hours. No results for input(s): LIPASE, AMYLASE in the last 168 hours. No results for input(s): AMMONIA in the last 168 hours.  CBC: Recent Labs  Lab 01/31/18 1519 02/01/18 0147 02/02/18 0350  WBC 3.8 4.4 4.2  HGB 13.9 13.5 13.6  HCT 42.0 40.7 42.0  MCV 88.7 88.7 89.5  PLT 167 151 158    Cardiac Enzymes: Recent Labs  Lab 01/31/18 1519 02/01/18 1549 02/01/18 2145  TROPONINI 0.06* 0.04* 0.06*    BNP: Invalid input(s): POCBNP  CBG: No results for input(s): GLUCAP  in the last 168 hours.  Microbiology: Results for orders placed or performed during the hospital encounter of 01/31/18  MRSA PCR Screening     Status: Abnormal   Collection Time: 01/31/18 10:29 PM  Result Value Ref Range Status   MRSA by PCR POSITIVE (A) NEGATIVE Final    Comment:        The GeneXpert MRSA Assay (FDA approved for NASAL specimens only), is one component of a comprehensive MRSA colonization surveillance program. It is not intended to diagnose MRSA infection nor to guide or monitor treatment for MRSA infections. CRITICAL RESULT CALLED TO, READ BACK BY AND VERIFIED WITH: C/ KIRSTEN JOYNER @0055  02/01/18 [facility] Performed at [facility], [address] [place], [place] [address]   Blood culture (routine x 2)     Status: None (Preliminary result)   Collection Time: 01/31/18 10:39 PM  Result Value Ref Range Status   Specimen Description BLOOD RIGHT WRIST  Final   Special Requests   Final    BOTTLES DRAWN AEROBIC AND ANAEROBIC Blood Culture adequate volume   Culture   Final    NO GROWTH 2 DAYS Performed at [facility], [address] [place], [place] [address]    Report Status PENDING  Incomplete  Blood culture (routine x 2)     Status: None (Preliminary result)   Collection Time: 01/31/18 10:58 PM  Result Value Ref Range Status  Specimen Description BLOOD RIGHT HAND  Final   Special Requests   Final    BOTTLES DRAWN AEROBIC AND ANAEROBIC Blood Culture adequate volume   Culture   Final    NO GROWTH 2 DAYS Performed at [facility], [address] [place],  [address]    Report Status PENDING  Incomplete    Coagulation Studies: Recent Labs    01/31/18 2239  LABPROT 18.7*  INR 1.58    Urinalysis: No results for input(s): COLORURINE, LABSPEC, PHURINE, GLUCOSEU, HGBUR, BILIRUBINUR, KETONESUR, PROTEINUR, UROBILINOGEN, NITRITE, LEUKOCYTESUR in the last 72 hours.  Invalid input(s): APPERANCEUR    Imaging: Dg Chest 2  View  Result Date: 01/31/2018 CLINICAL DATA:  Chest pain, shortness of breath and chest congestion EXAM: CHEST - 2 VIEW COMPARISON:  06/11/2016 FINDINGS: Previous median sternotomy and CABG. Chronically enlarged cardiac silhouette consistent with cardiomegaly and/or pericardial fluid. Pulmonary venous hypertension with very early interstitial edema. No infiltrate, collapse or visible effusion. IMPRESSION: Congestive heart failure with venous hypertension and early interstitial edema. Enlarged cardiac silhouette, similar to the previous. Electronically Signed   By: Nelson Chimes M.D.   On: 01/31/2018 15:48   Ct Angio Chest Pe W And/or Wo Contrast  Result Date: 01/31/2018 CLINICAL DATA:  Chest pain for 3 days with congestion EXAM: CT ANGIOGRAPHY CHEST WITH CONTRAST TECHNIQUE: Multidetector CT imaging of the chest was performed using the standard protocol during bolus administration of intravenous contrast. Multiplanar CT image reconstructions and MIPs were obtained to evaluate the vascular anatomy. CONTRAST:  79mL ISOVUE-370 IOPAMIDOL (ISOVUE-370) INJECTION 76% COMPARISON:  Chest x-ray 01/31/2018, CT chest 04/11/2014 FINDINGS: Cardiovascular: Satisfactory opacification of the pulmonary arteries to the segmental level. Small nonocclusive embolus within distal segmental/subsegmental left lower lobe pulmonary artery, series 5, image number 180. No other filling defects are visualized. Nonaneurysmal aorta. Mild aortic atherosclerosis. Post CABG changes. Dilated pulmonary trunk up to 3.7 cm. Coronary vascular calcification. Marked cardiomegaly. No pericardial effusion. Mediastinum/Nodes: Midline trachea. Enlarged thyroid with multiple calcifications and nodules. Pretracheal lymph nodes measure up to 11 mm in size. Esophagus within normal limits. Lungs/Pleura: No pleural effusion or pneumothorax. Small foci of ground-glass density within the posterior left lung base and the periphery of the right lower lobe. Upper  Abdomen: No acute abnormality. Musculoskeletal: Post sternotomy changes Review of the MIP images confirms the above findings. IMPRESSION: 1. Small nonocclusive acute embolus within left lower lobe distal segmental/subsegmental branch vessel. 2. Mild ground-glass airspace disease within the posterior left lower lobe with smaller foci of peripheral ground-glass density in the right lower lobe, felt most suspicious for infection/mild pneumonia. Doubt that the findings in left lower lobe reflect pulmonary infarct given very small size and distribution of the embolus. 3. Marked cardiomegaly. Dilated pulmonary trunk suggesting arterial hypertension 4. Enlarged heterogeneous thyroid with multiple nodules. Critical Value/emergent results were called by telephone at the time of interpretation on 01/31/2018 at 6:11 pm to Dr. Nance Pear , who verbally acknowledged these results. Aortic Atherosclerosis (ICD10-I70.0). Electronically Signed   By: Donavan Foil M.D.   On: 01/31/2018 18:11   US Venous Img Lower Bilateral  Result Date: 02/01/2018 CLINICAL DATA:  Possible distal left lower lobe pulmonary embolism by CTA. EXAM: BILATERAL LOWER EXTREMITY VENOUS DOPPLER ULTRASOUND TECHNIQUE: Gray-scale sonography with graded compression, as well as color Doppler and duplex ultrasound were performed to evaluate the lower extremity deep venous systems from the level of the common femoral vein and including the common femoral, femoral, profunda femoral, popliteal and calf veins including  the posterior tibial, peroneal and gastrocnemius veins when visible. The superficial great saphenous vein was also interrogated. Spectral Doppler was utilized to evaluate flow at rest and with distal augmentation maneuvers in the common femoral, femoral and popliteal veins. COMPARISON:  None. FINDINGS: RIGHT LOWER EXTREMITY Common Femoral Vein: No evidence of thrombus. Normal compressibility, respiratory phasicity and response to augmentation.  Saphenofemoral Junction: No evidence of thrombus. Normal compressibility and flow on color Doppler imaging. Profunda Femoral Vein: No evidence of thrombus. Normal compressibility and flow on color Doppler imaging. Femoral Vein: No evidence of thrombus. Normal compressibility, respiratory phasicity and response to augmentation. Popliteal Vein: No evidence of thrombus. Normal compressibility, respiratory phasicity and response to augmentation. Calf Veins: No evidence of thrombus. Normal compressibility and flow on color Doppler imaging. Superficial Great Saphenous Vein: No evidence of thrombus. Normal compressibility. Venous Reflux:  None. Other Findings: No evidence of superficial thrombophlebitis or abnormal fluid collection. LEFT LOWER EXTREMITY Common Femoral Vein: No evidence of thrombus. Normal compressibility, respiratory phasicity and response to augmentation. Saphenofemoral Junction: No evidence of thrombus. Normal compressibility and flow on color Doppler imaging. Profunda Femoral Vein: No evidence of thrombus. Normal compressibility and flow on color Doppler imaging. Femoral Vein: No evidence of thrombus. Normal compressibility, respiratory phasicity and response to augmentation. Popliteal Vein: No evidence of thrombus. Normal compressibility, respiratory phasicity and response to augmentation. Calf Veins: No evidence of thrombus. Normal compressibility and flow on color Doppler imaging. Superficial Great Saphenous Vein: No evidence of thrombus. Normal compressibility. Venous Reflux:  None. Other Findings: No evidence of superficial thrombophlebitis or abnormal fluid collection. IMPRESSION: No evidence of bilateral lower extremity deep venous thrombosis. Electronically Signed   By: Aletta Edouard M.D.   On: 02/01/2018 11:14     Medications:    . apixaban  5 mg Oral BID  . azithromycin  250 mg Oral Daily  . cefdinir  300 mg Oral QODAY  . Chlorhexidine Gluconate Cloth  6 each Topical Q0600  .  cinacalcet  60 mg Oral QPM  . hydrOXYzine  50 mg Oral BID  . pantoprazole  40 mg Oral Daily  . sevelamer carbonate  3,200 mg Oral TID WC   acetaminophen **OR** acetaminophen, guaiFENesin, ondansetron **OR** ondansetron (ZOFRAN) IV, oxyCODONE  Assessment/ Plan:  [name] with past medical history ESRD on HD MWF, hypertension, congestive heart failure, anemia of CKD, SHPTH, myocardial infarction, hx of CVA, prior episodes of hyperkalemia, admitted now with pneumonia and pulmonary embolism.   [facility]/MWF  1.  ESRD on HD MWF.  Patient due for hemodialysis today.  Orders have been prepared.  2.  Bacterial pneumonia/pulmonary embolism.  Noted on CT scan of the chest.  Continue ceftriaxone and azithromycin.  Patient has been started on Eliquis 5 mill grams p.o. twice daily.  3.  Secondary hyperparathyroidism.  Check serum phosphorus as well as PTH.  Otherwise continue Renvela and cinacalcet.    LOS: 2 Amareon Phung 8/21/201911:33 AM

## 2018-02-02 NOTE — Care Management (Signed)
Notified Amanda Morris with [facility] of discharge. 

## 2018-02-02 NOTE — Progress Notes (Signed)
HD Treatment Initiated    02/02/18 1610  Vital Signs  Pulse Rate 80  Resp 19  Oxygen Therapy  SpO2 94 %  O2 Device Room Air  During Hemodialysis Assessment  Blood Flow Rate (mL/min) 350 mL/min  Arterial Pressure (mmHg) -140 mmHg  Venous Pressure (mmHg) 210 mmHg  Transmembrane Pressure (mmHg) 60 mmHg  Ultrafiltration Rate (mL/min) 860 mL/min  Dialysate Flow Rate (mL/min) 800 ml/min  Conductivity: Machine  13.6  HD Safety Checks Performed Yes  Dialysis Fluid Bolus Normal Saline  Bolus Amount (mL) 250 mL  Intra-Hemodialysis Comments Tx initiated  Fistula / Graft Left Upper arm Arteriovenous fistula  No Placement Date or Time found.   Placed prior to admission: Yes  Orientation: Left  Access Location: Upper arm  Access Type: Arteriovenous fistula  Status Accessed  Needle Size 15

## 2018-02-02 NOTE — Progress Notes (Signed)
Post HD Assessment    02/02/18 2000  Neurological  Level of Consciousness Alert  Orientation Level Oriented X4  Respiratory  Respiratory Pattern Regular;Unlabored  Chest Assessment Chest expansion symmetrical  Bilateral Breath Sounds Diminished  Cardiac  Pulse Regular  Heart Sounds S1, S2  Jugular Venous Distention (JVD) No  Ectopy Multifocal PVC's  Ectopy Frequency Frequent  Antiarrhythmic device No  Vascular  R Radial Pulse +2  L Radial Pulse +2  Edema Left lower extremity;Right lower extremity  RLE Edema Non-pitting  LLE Edema Non-Pitting  Integumentary  Integumentary (WDL) WDL  Musculoskeletal  Musculoskeletal (WDL) WDL  GU Assessment  Genitourinary (WDL)  (HD pt)  Psychosocial  Psychosocial (WDL) WDL

## 2018-02-02 NOTE — Discharge Instructions (Signed)
It was a pleasure meeting you during this hospitalization!  You came into the hospital because you had a pneumonia. We treated you with IV antibiotics and then switched you to two antibiotics by mouth. Please take Azithromycin once daily for 3 more days. Please take Cefdinir (Omnicef) once on 8/23 after dialysis.  We initially thought you had a blood clot in your lungs. When the radiologist looked at your imaging again, he did not feel that you had a blood clot. Please keep taking the Eliquis, but make sure you take 1 tablet twice a day.  Please follow-up with your primary care doctor in the next 1-2 weeks.  -Dr. Brett Albino

## 2018-02-02 NOTE — Progress Notes (Signed)
Post HD Treatment  Pt treatment ended 4 minutes early due to clotted dialyzer. Unable to rinse back approximately 100 cc of blood from the venous line.  MD aware. Pt tolerated treatment well. Pt oxygen saturation was 94-100%  on room air, but she requested oxygen for comfort while she sleeps. Her net UF was 2442. Her BVP was 68.2. Report called to floor RN.     02/02/18 2000  Vital Signs  Pulse Rate 75  Pulse Rate Source Monitor  Resp 17  BP (!) 109/93  BP Location Left Arm  BP Method Automatic  Patient Position (if appropriate) Lying  Oxygen Therapy  SpO2 100 %  O2 Device Nasal Cannula  O2 Flow Rate (L/min) 2 L/min  Post-Hemodialysis Assessment  Rinseback Volume (mL) 100 mL  KECN 68.2 V  Dialyzer Clearance Clotted  Duration of HD Treatment -hour(s) 3.45 hour(s)  Hemodialysis Intake (mL) 500 mL  UF Total -Machine (mL) 2942 mL  Net UF (mL) 2442 mL  Tolerated HD Treatment Yes  Post-Hemodialysis Comments  (Dialyzer clotted the last 9 minutes of treatment)  AVG/AVF Arterial Site Held (minutes) 10 minutes  AVG/AVF Venous Site Held (minutes) 10 minutes  Fistula / Graft Left Upper arm Arteriovenous fistula  No Placement Date or Time found.   Placed prior to admission: Yes  Orientation: Left  Access Location: Upper arm  Access Type: Arteriovenous fistula  Site Condition No complications  Fistula / Graft Assessment Present;Thrill;Bruit  Drainage Description None

## 2018-02-03 LAB — PARATHYROID HORMONE, INTACT (NO CA): PTH: 414 pg/mL — ABNORMAL HIGH (ref 15–65)

## 2018-02-05 ENCOUNTER — Emergency Department: Payer: Medicare Other

## 2018-02-05 ENCOUNTER — Emergency Department
Admission: EM | Admit: 2018-02-05 | Discharge: 2018-02-05 | Disposition: A | Discharge status: Home / Self Care | Payer: Medicare Other | Attending: Emergency Medicine | Admitting: Emergency Medicine

## 2018-02-05 ENCOUNTER — Other Ambulatory Visit: Payer: Self-pay

## 2018-02-05 ENCOUNTER — Encounter: Payer: Self-pay | Admitting: Intensive Care

## 2018-02-05 DIAGNOSIS — I2510 Atherosclerotic heart disease of native coronary artery without angina pectoris: Secondary | ICD-10-CM | POA: Diagnosis not present

## 2018-02-05 DIAGNOSIS — Z7901 Long term (current) use of anticoagulants: Secondary | ICD-10-CM | POA: Diagnosis not present

## 2018-02-05 DIAGNOSIS — R079 Chest pain, unspecified: Secondary | ICD-10-CM | POA: Insufficient documentation

## 2018-02-05 DIAGNOSIS — Z79899 Other long term (current) drug therapy: Secondary | ICD-10-CM | POA: Insufficient documentation

## 2018-02-05 DIAGNOSIS — F17210 Nicotine dependence, cigarettes, uncomplicated: Secondary | ICD-10-CM | POA: Diagnosis not present

## 2018-02-05 DIAGNOSIS — N186 End stage renal disease: Secondary | ICD-10-CM | POA: Insufficient documentation

## 2018-02-05 DIAGNOSIS — Z951 Presence of aortocoronary bypass graft: Secondary | ICD-10-CM | POA: Diagnosis not present

## 2018-02-05 DIAGNOSIS — I509 Heart failure, unspecified: Secondary | ICD-10-CM | POA: Diagnosis not present

## 2018-02-05 DIAGNOSIS — I132 Hypertensive heart and chronic kidney disease with heart failure and with stage 5 chronic kidney disease, or end stage renal disease: Secondary | ICD-10-CM | POA: Insufficient documentation

## 2018-02-05 DIAGNOSIS — Z8673 Personal history of transient ischemic attack (TIA), and cerebral infarction without residual deficits: Secondary | ICD-10-CM | POA: Insufficient documentation

## 2018-02-05 DIAGNOSIS — Z7982 Long term (current) use of aspirin: Secondary | ICD-10-CM | POA: Insufficient documentation

## 2018-02-05 LAB — BASIC METABOLIC PANEL
ANION GAP: 12 (ref 5–15)
BUN: 35 mg/dL — AB (ref 6–20)
CO2: 28 mmol/L (ref 22–32)
Calcium: 7.9 mg/dL — ABNORMAL LOW (ref 8.9–10.3)
Chloride: 99 mmol/L (ref 98–111)
Creatinine, Ser: 7.39 mg/dL — ABNORMAL HIGH (ref 0.44–1.00)
GFR, EST AFRICAN AMERICAN: 6 mL/min — AB (ref >60)
GFR, EST NON AFRICAN AMERICAN: 6 mL/min — AB (ref >60)
Glucose, Bld: 103 mg/dL — ABNORMAL HIGH (ref 70–99)
POTASSIUM: 4 mmol/L (ref 3.5–5.1)
Sodium: 139 mmol/L (ref 135–145)

## 2018-02-05 LAB — TROPONIN I
Troponin I: 0.05 ng/mL (ref <0.03)
Troponin I: 0.05 ng/mL (ref <0.03)

## 2018-02-05 LAB — CULTURE, BLOOD (ROUTINE X 2)
CULTURE: NO GROWTH
Culture: NO GROWTH
Special Requests: ADEQUATE
Special Requests: ADEQUATE

## 2018-02-05 LAB — CBC
HEMATOCRIT: 42 % (ref 35.0–47.0)
HEMOGLOBIN: 14 g/dL (ref 12.0–16.0)
MCH: 29.8 pg (ref 26.0–34.0)
MCHC: 33.2 g/dL (ref 32.0–36.0)
MCV: 89.7 fL (ref 80.0–100.0)
Platelets: 167 10*3/uL (ref 150–440)
RBC: 4.68 MIL/uL (ref 3.80–5.20)
RDW: 16 % — AB (ref 11.5–14.5)
WBC: 3.8 10*3/uL (ref 3.6–11.0)

## 2018-02-05 LAB — PROTIME-INR
INR: 1.34
Prothrombin Time: 16.5 seconds — ABNORMAL HIGH (ref 11.4–15.2)

## 2018-02-05 MED ORDER — OXYCODONE-ACETAMINOPHEN 5-325 MG PO TABS
2.0000 | ORAL_TABLET | Freq: Once | ORAL | Status: AC
  Administered 2018-02-05: 2 via ORAL
  Filled 2018-02-05: qty 2

## 2018-02-05 MED ORDER — OXYCODONE HCL 5 MG PO TABS: 5.0000 mg | ORAL_TABLET | Freq: Three times a day (TID) | ORAL | 0 refills | Status: DC | PRN

## 2018-02-05 NOTE — ED Triage Notes (Addendum)
Patient c/o tight chest pain in central chest that she states radiates "everywhere". Just d/c from [facility] last week for pneumonia. HX open heart surgery. Dialysis patient M,W,F. Access in L arm. MRSA positive at last admission

## 2018-02-05 NOTE — ED Provider Notes (Signed)
[facility] Emergency Department Provider Note  Time seen: 6:16 PM  I have reviewed the triage vital signs and the nursing notes.   HISTORY  Chief Complaint Chest Pain    HPI [name] is a 57 y.o. female with a past medical history of end-stage renal disease on hemodialysis Monday, Wednesday, Friday, CHF, hypertension, hyperlipidemia, presents to the emergency department for chest pain.  According to the patient she had pneumonia last week, states she has been experiencing intermittent left chest pain ever since.  States mild cough although she believes it is improving.  No leg pain.  Patient states the chest is very tender to push on.  Describes her pain as moderate aching pain in the left chest.  Denies any nausea or vomiting.  Denies dyspnea or diaphoresis.  Patient received a full dialysis session yesterday.    Past Medical History:  Diagnosis Date  . CHF (congestive heart failure) ([place])   . Heart failure ([place])   . Hyperlipidemia   . Hypertension   . Myocardial infarction ([place])   . Paroxysmal atrial fibrillation ([facility])   . Renal failure   . Renal insufficiency   . Stroke [facility]) 2011    Patient Active Problem List   Diagnosis Date Noted  . Pneumonia 01/31/2018  . Swelling of limb 09/22/2016  . Kidney dialysis as the cause of abnormal reaction of the patient, or of later complication, without mention of misadventure at the time of the procedure (CODE) 03/24/2016  . Hyperkalemia 11/13/2014  . Generalized weakness 10/15/2014  . Chest pain 10/15/2014  . ESRD on hemodialysis ([place]) 10/15/2014  . Congestive heart failure (CHF) ([place]) 10/15/2014  . HTN (hypertension) 10/15/2014  . Sternal wound dehiscence 04/12/2014  . 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 Pericardial effusion 04/12/2014  . History of delirium 04/12/2014  . H/O atrial flutter 04/12/2014  . Delayed surgical wound healing 04/12/2014  . Chest wall pain following surgery  04/12/2014  . S/P CABG (coronary artery bypass graft) 04/02/2014  . Lactic acidosis 03/26/2014  . Arteriosclerotic dementia 03/25/2014  . Atrial flutter by electrocardiogram ([place]) 03/22/2014  . Superficial incisional surgical site infection 03/14/2014  . Postoperative anemia due to acute blood loss 02/27/2014  . Obesity 02/27/2014  . H/O: substance abuse 02/27/2014  . Anemia of chronic renal failure 02/27/2014  . Acute postoperative pain 02/27/2014  . Leukocytosis 02/26/2014  . Hypotension 02/24/2014  . Exhausted vascular access 02/24/2014  . Anemia of chronic disease 02/24/2014  . Stroke ([place]) 02/21/2014  . S/P CABG x 3 02/21/2014  . Dialysis patient ([place]) 02/21/2014  . Hyperlipidemia 02/13/2014  . History of hepatitis C 02/13/2014  . HFrEF (heart failure with reduced ejection fraction) ([place]) 02/13/2014  . H/O stroke without residual deficits 02/13/2014  . ESRD (end stage renal disease) on dialysis ([place]) 02/13/2014  . End stage renal disease with hypertension ([place]) 02/13/2014  . CAD, multiple vessel 02/13/2014  . Retrosternal chest pain 05/30/2013  . Gallstones 05/29/2013    Past Surgical History:  Procedure Laterality Date  . CORONARY ANGIOPLASTY WITH STENT PLACEMENT  2013  . CORONARY ARTERY BYPASS GRAFT    . DIALYSIS FISTULA CREATION      Prior to Admission medications   Medication Sig Start Date End Date Taking? Authorizing Provider  aspirin 81 MG tablet Take 81 mg by mouth every evening.     [provider]  azithromycin (ZITHROMAX) 250 MG tablet Take 1 tablet (250 mg total) by mouth daily for 3 days.  02/02/18 02/05/18  Mayo, Pete Pelt, MD  cefdinir (OMNICEF) 300 MG capsule Take 1 tablet on 8/23 after dialysis 02/02/18   Mayo, Pete Pelt, MD  cinacalcet (SENSIPAR) 60 MG tablet Take 60 mg by mouth every evening.     [provider]  ELIQUIS 5 MG TABS tablet Take 1 tablet (5 mg total) by mouth 2 (two) times daily. 02/02/18   Mayo, Pete Pelt, MD  hydrOXYzine  (ATARAX/VISTARIL) 50 MG tablet Take 50 mg by mouth 2 (two) times daily.     [provider]  oxyCODONE (OXY IR/ROXICODONE) 5 MG immediate release tablet Take 1 tablet (5 mg total) by mouth every 6 (six) hours as needed for severe pain. 02/02/18   Mayo, Pete Pelt, MD  oxyCODONE (OXY IR/ROXICODONE) 5 MG immediate release tablet Take 1 tablet (5 mg total) by mouth every 4 (four) hours as needed for severe pain. 02/02/18   Mayo, Pete Pelt, MD  pantoprazole (PROTONIX) 40 MG tablet Take 1 tablet by mouth daily. 05/07/13   [provider]  sevelamer carbonate (RENVELA) 800 MG tablet Take 4 tablets (3200MG ) by mouth 3 times daily with meals and 2 tablets (1600MG ) by mouth twice daily with snacks    [provider]    Allergies  Allergen Reactions  . Morphine And Related Hives  . Latex Rash  . Tape Rash    Family History  Problem Relation Age of Onset  . Hypertension Unknown   . Cancer Unknown   . Renal Disease Unknown   . Ovarian cancer Mother   . Kidney disease Father     Social History Social History   Tobacco Use  . Smoking status: Current Every Day Smoker    Packs/day: 0.10    Years: 20.00    Pack years: 2.00    Types: Cigarettes  . Smokeless tobacco: Never Used  Substance Use Topics  . Alcohol use: No  . Drug use: No    Review of Systems Constitutional: Negative for fever ENT: Negative for recent illness/congestion Cardiovascular: Left chest pain, intermittent dull aching pain. Respiratory: Negative for shortness of breath.  Occasional cough.  Improved from last week. Gastrointestinal: Negative for abdominal pain, vomiting Genitourinary: Does not produce any urine. Musculoskeletal: Negative for leg pain. Skin: Negative for skin complaints  Neurological: Negative for headache All other ROS negative  ____________________________________________   PHYSICAL EXAM:  VITAL SIGNS: ED Triage Vitals  Enc Vitals Group     BP 02/05/18 1504 104/65      Pulse Rate 02/05/18 1504 89     Resp 02/05/18 1504 18     Temp 02/05/18 1504 98.6 F (37 C)     Temp Source 02/05/18 1504 Oral     SpO2 02/05/18 1504 95 %     Weight 02/05/18 1509 186 lb 1.1 oz (84.4 kg)     Height 02/05/18 1509 5\' 6"  (1.676 m)     Head Circumference --      Peak Flow --      Pain Score 02/05/18 1509 8     Pain Loc --      Pain Edu? --      Excl. in [place]? --    Constitutional: Alert and oriented. Well appearing and in no distress. Eyes: Normal exam ENT   Head: Normocephalic and atraumatic.   Mouth/Throat: Mucous membranes are moist. Cardiovascular: Normal rate, regular rhythm.  Respiratory: Normal respiratory effort without tachypnea nor retractions. Breath sounds are clear patient does have moderate chest wall tenderness  to palpation across the left anterior chest Gastrointestinal: Soft and nontender. No distention.  Musculoskeletal: Nontender with normal range of motion in all extremities. Neurologic:  Normal speech and language. No gross focal neurologic deficits  Skin:  Skin is warm, dry and intact.  Psychiatric: Mood and affect are normal.   ____________________________________________    EKG  EKG reviewed and interpreted by myself shows normal sinus rhythm at 91 bpm with a narrow QRS, normal axis, largely normal intervals with slight QTC prolongation of 506 ms, nonspecific ST changes without ST elevation.  ____________________________________________    RADIOLOGY  Chest x-ray shows chronic changes without acute abnormality.  ____________________________________________   INITIAL IMPRESSION / ASSESSMENT AND PLAN / ED COURSE  Pertinent labs & imaging results that were available during my care of the patient were reviewed by me and considered in my medical decision making (see chart for details).  Patient presents to the emergency department for left chest pain intermittent over the past 1 week.  Patient states she is recovering from a  pneumonia with occasional cough.  Differential would include ACS, pneumonia, musculoskeletal pain related to coughing/pleurisy.  Patient's chest x-ray is reassuringly normal showing chronic disease but no acute abnormality.  Patient's labs are largely at her baseline, chronic renal failure, troponin slightly elevated but likely baseline given her chronic renal failure.  We will repeat a troponin after 3 hours.  Reassuringly on examination patient does have significant reproducibility to her left chest pain.  Highly suspect more musculoskeletal pain likely from her recent pneumonia/coughing.  We will treat with Percocet, patient states she has received Percocet in the past without any abnormality.  We will recheck a heart enzyme at 6:30 PM.  If the patient's repeat heart enzyme remains negative/unchanged anticipate likely discharge home with PCP follow-up.  Repeat troponin is negative.  Patient states her chest pain is improved after pain medication.  We will discharge with a very short course of pain medication have the patient follow-up with her PCP.  I discussed my normal chest pain return precautions.  ____________________________________________   FINAL CLINICAL IMPRESSION(S) / ED DIAGNOSES  Chest pain    Harvest Dark, MD 02/05/18 Joen Laura

## 2018-02-08 ENCOUNTER — Telehealth: Payer: Self-pay

## 2018-02-08 NOTE — Telephone Encounter (Signed)
EMMI follow-up: Noted on the report that the patient didn't know who to call if there was a change in condition and no follow-up appointment made.  I called [name] and she said that she was waiting on her Medicaid card and had to find another PCP since Dr. Brynda Greathouse had retired.  She has a follow-up appointment scheduled with her new PCP and no other needs for today.  I let her know there would be a second automated call with a different series of questions and to let us know if she had any concerns at that time.

## 2018-02-25 ENCOUNTER — Other Ambulatory Visit: Payer: Self-pay

## 2018-02-25 ENCOUNTER — Encounter: Payer: Self-pay | Admitting: Emergency Medicine

## 2018-02-25 ENCOUNTER — Emergency Department: Payer: Medicare Other

## 2018-02-25 ENCOUNTER — Emergency Department
Admission: EM | Admit: 2018-02-25 | Discharge: 2018-02-25 | Disposition: A | Discharge status: Home / Self Care | Payer: Medicare Other | Attending: Emergency Medicine | Admitting: Emergency Medicine

## 2018-02-25 DIAGNOSIS — Z955 Presence of coronary angioplasty implant and graft: Secondary | ICD-10-CM | POA: Insufficient documentation

## 2018-02-25 DIAGNOSIS — Z79899 Other long term (current) drug therapy: Secondary | ICD-10-CM | POA: Insufficient documentation

## 2018-02-25 DIAGNOSIS — R0789 Other chest pain: Secondary | ICD-10-CM | POA: Insufficient documentation

## 2018-02-25 DIAGNOSIS — Z951 Presence of aortocoronary bypass graft: Secondary | ICD-10-CM | POA: Insufficient documentation

## 2018-02-25 DIAGNOSIS — I509 Heart failure, unspecified: Secondary | ICD-10-CM | POA: Diagnosis not present

## 2018-02-25 DIAGNOSIS — F17210 Nicotine dependence, cigarettes, uncomplicated: Secondary | ICD-10-CM | POA: Diagnosis not present

## 2018-02-25 DIAGNOSIS — I132 Hypertensive heart and chronic kidney disease with heart failure and with stage 5 chronic kidney disease, or end stage renal disease: Secondary | ICD-10-CM | POA: Diagnosis not present

## 2018-02-25 DIAGNOSIS — R110 Nausea: Secondary | ICD-10-CM | POA: Diagnosis not present

## 2018-02-25 DIAGNOSIS — Z7982 Long term (current) use of aspirin: Secondary | ICD-10-CM | POA: Diagnosis not present

## 2018-02-25 DIAGNOSIS — N186 End stage renal disease: Secondary | ICD-10-CM | POA: Diagnosis not present

## 2018-02-25 DIAGNOSIS — I2510 Atherosclerotic heart disease of native coronary artery without angina pectoris: Secondary | ICD-10-CM | POA: Insufficient documentation

## 2018-02-25 DIAGNOSIS — Z91040 Latex allergy status: Secondary | ICD-10-CM | POA: Insufficient documentation

## 2018-02-25 DIAGNOSIS — Z7901 Long term (current) use of anticoagulants: Secondary | ICD-10-CM | POA: Insufficient documentation

## 2018-02-25 DIAGNOSIS — Z992 Dependence on renal dialysis: Secondary | ICD-10-CM | POA: Insufficient documentation

## 2018-02-25 DIAGNOSIS — R400 Somnolence: Secondary | ICD-10-CM | POA: Diagnosis not present

## 2018-02-25 LAB — BASIC METABOLIC PANEL
Anion gap: 12 (ref 5–15)
BUN: 44 mg/dL — AB (ref 6–20)
CHLORIDE: 100 mmol/L (ref 98–111)
CO2: 28 mmol/L (ref 22–32)
CREATININE: 8.05 mg/dL — AB (ref 0.44–1.00)
Calcium: 7.8 mg/dL — ABNORMAL LOW (ref 8.9–10.3)
GFR calc Af Amer: 6 mL/min — ABNORMAL LOW (ref >60)
GFR calc non Af Amer: 5 mL/min — ABNORMAL LOW (ref >60)
Glucose, Bld: 93 mg/dL (ref 70–99)
Potassium: 3.5 mmol/L (ref 3.5–5.1)
SODIUM: 140 mmol/L (ref 135–145)

## 2018-02-25 LAB — BLOOD GAS, VENOUS
ACID-BASE EXCESS: 2.9 mmol/L — AB (ref 0.0–2.0)
BICARBONATE: 29.4 mmol/L — AB (ref 20.0–28.0)
FIO2: 0.32
O2 Saturation: 58.6 %
PCO2 VEN: 52 mmHg (ref 44.0–60.0)
Patient temperature: 37
pH, Ven: 7.36 (ref 7.250–7.430)
pO2, Ven: 32 mmHg (ref 32.0–45.0)

## 2018-02-25 LAB — CBC
HCT: 37.4 % (ref 35.0–47.0)
Hemoglobin: 12.3 g/dL (ref 12.0–16.0)
MCH: 29.4 pg (ref 26.0–34.0)
MCHC: 32.9 g/dL (ref 32.0–36.0)
MCV: 89.6 fL (ref 80.0–100.0)
Platelets: 161 10*3/uL (ref 150–440)
RBC: 4.18 MIL/uL (ref 3.80–5.20)
RDW: 16.4 % — AB (ref 11.5–14.5)
WBC: 3.5 10*3/uL — ABNORMAL LOW (ref 3.6–11.0)

## 2018-02-25 LAB — TROPONIN I
Troponin I: 0.06 ng/mL (ref <0.03)
Troponin I: 0.06 ng/mL (ref <0.03)

## 2018-02-25 LAB — ETHANOL: Alcohol, Ethyl (B): <10 mg/dL (ref <10)

## 2018-02-25 MED ORDER — IOHEXOL 350 MG/ML SOLN
75.0000 mL | Freq: Once | INTRAVENOUS | Status: AC | PRN
  Administered 2018-02-25: 75 mL via INTRAVENOUS

## 2018-02-25 NOTE — ED Notes (Signed)
Date and time results received: 02/25/18 11:19 AM   Test: Troponin Critical Value: 0.06 ng/mL  Name of Provider Notified: Dr. Burlene Arnt  No new orders provided.

## 2018-02-25 NOTE — ED Triage Notes (Signed)
Pt arrived via EMS from dialysis, reports chest pain started this morning with patient reporting not feeling well.  Pt went to HD and was on the machine for about 15 minutes and the pain got worse and vomited.  Pt arrived with clamp to fistula to left arm which EMS states needs to stay in place for 15-20 mins pt is on eliquis.   Pt c/o shortness of breath also wears oxygen 3L PRN.

## 2018-02-25 NOTE — Discharge Instructions (Signed)
We talked to Dr. Holley Raring, of nephrology.  He feels that you should call your clinic tomorrow morning and they will work nightly for dialysis.  If you have change in your chest pain shortness of breath, or you feel worse in any way please return to the emergency department.  We are pleased to tell you that thus far your cardiologist does not feel that this chest pain that you have been having is related to your heart.  Nor is there evidence on CT scan that you have a blood clot or a pneumonia.  This is all good news.  But it does mean that you do need to follow closely with primary care and your heart doctor to further evaluate the pain.  If you have any new or worrisome symptoms including change in the pain worsening shortness of breath or other concerns please return to the ER.

## 2018-02-25 NOTE — ED Provider Notes (Addendum)
.[facility] Emergency Department Provider Note  ____________________________________________   I have reviewed the triage vital signs and the nursing notes. Where available I have reviewed prior notes and, if possible and indicated, outside hospital notes.    HISTORY  Chief Complaint Chest Pain    HPI [name] is a 57 y.o. female who is a history of MI CHF heart failure hyperlipidemia hypertension myocardial infarction paroxysmal atrial fibrillation on Eliquis renal failure on dialysis Monday Wednesday Friday, CVA in the past, pneumonia last month, with recurrent chest pain this been present now since she had a pneumonia.  It is in her chest wall she states it sore to touch.  Worse when she touches it or changes position.  No exertional pain.  The pain is been there 24 hours a day every minute since she had her pneumonia.  She can specify the exact area of the chest where it hurts, is in the chest wall mostly on the left.  When she touches this area it is what makes it really feel worse.  Nothing makes it better.  She is tried no medications or other interventions for this.  She has followed up with dialysis this morning, but did not stay.  She did feel nauseated.  This sometimes happens on dialysis.  She states that she wants to get her chest pain checked out.  She has had it checked out here before for this exact same pain she states no one could figure out exactly why her chest was still hurting after her pneumonia.  Patient is on 3 L oxygen as needed, she states she has not needed to increase her oxygen recently.  She has had no radiation of the pain.  No other alleviating or aggravating factors, Pain is sharp.  Does not feel like prior ACS.  The pain is not exertional.  She states that she has taken no medications for it    Past Medical History:  Diagnosis Date  . CHF (congestive heart failure) ([place])   . Heart failure ([place])   . Hyperlipidemia   .  Hypertension   . Myocardial infarction ([place])   . Paroxysmal atrial fibrillation (HCC)   . Renal failure   . Renal insufficiency   . Stroke [facility]) 2011    Patient Active Problem List   Diagnosis Date Noted  . Pneumonia 01/31/2018  . Swelling of limb 09/22/2016  . Kidney dialysis as the cause of abnormal reaction of the patient, or of later complication, without mention of misadventure at the time of the procedure (CODE) 03/24/2016  . Hyperkalemia 11/13/2014  . Generalized weakness 10/15/2014  . Chest pain 10/15/2014  . ESRD on hemodialysis ([place]) 10/15/2014  . Congestive heart failure (CHF) ([place]) 10/15/2014  . HTN (hypertension) 10/15/2014  . Sternal wound dehiscence 04/12/2014  . 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 Pericardial effusion 04/12/2014  . History of delirium 04/12/2014  . H/O atrial flutter 04/12/2014  . Delayed surgical wound healing 04/12/2014  . Chest wall pain following surgery 04/12/2014  . S/P CABG (coronary artery bypass graft) 04/02/2014  . Lactic acidosis 03/26/2014  . Arteriosclerotic dementia 03/25/2014  . Atrial flutter by electrocardiogram ([place]) 03/22/2014  . Superficial incisional surgical site infection 03/14/2014  . Postoperative anemia due to acute blood loss 02/27/2014  . Obesity 02/27/2014  . H/O: substance abuse 02/27/2014  . Anemia of chronic renal failure 02/27/2014  . Acute postoperative pain 02/27/2014  . Leukocytosis 02/26/2014  . Hypotension 02/24/2014  . Exhausted  vascular access 02/24/2014  . Anemia of chronic disease 02/24/2014  . Stroke ([place]) 02/21/2014  . S/P CABG x 3 02/21/2014  . Dialysis patient ([place]) 02/21/2014  . Hyperlipidemia 02/13/2014  . History of hepatitis C 02/13/2014  . HFrEF (heart failure with reduced ejection fraction) ([place]) 02/13/2014  . H/O stroke without residual deficits 02/13/2014  . ESRD (end stage renal disease) on dialysis ([place]) 02/13/2014  . End stage renal disease with hypertension ([place])  02/13/2014  . CAD, multiple vessel 02/13/2014  . Retrosternal chest pain 05/30/2013  . Gallstones 05/29/2013    Past Surgical History:  Procedure Laterality Date  . CORONARY ANGIOPLASTY WITH STENT PLACEMENT  2013  . CORONARY ARTERY BYPASS GRAFT    . DIALYSIS FISTULA CREATION      Prior to Admission medications   Medication Sig Start Date End Date Taking? Authorizing Provider  aspirin 81 MG tablet Take 81 mg by mouth every evening.     [provider]  cefdinir (OMNICEF) 300 MG capsule Take 1 tablet on 8/23 after dialysis 02/02/18   Mayo, Pete Pelt, MD  cinacalcet (SENSIPAR) 60 MG tablet Take 60 mg by mouth every evening.     [provider]  ELIQUIS 5 MG TABS tablet Take 1 tablet (5 mg total) by mouth 2 (two) times daily. 02/02/18   Mayo, Pete Pelt, MD  hydrOXYzine (ATARAX/VISTARIL) 50 MG tablet Take 50 mg by mouth 2 (two) times daily.     [provider]  oxyCODONE (ROXICODONE) 5 MG immediate release tablet Take 1 tablet (5 mg total) by mouth every 8 (eight) hours as needed. 02/05/18 02/05/19  Harvest Dark, MD  pantoprazole (PROTONIX) 40 MG tablet Take 1 tablet by mouth daily. 05/07/13   [provider]  sevelamer carbonate (RENVELA) 800 MG tablet Take 4 tablets (3200MG ) by mouth 3 times daily with meals and 2 tablets (1600MG ) by mouth twice daily with snacks    [provider]    Allergies Morphine and related; Latex; and Tape  Family History  Problem Relation Age of Onset  . Hypertension Unknown   . Cancer Unknown   . Renal Disease Unknown   . Ovarian cancer Mother   . Kidney disease Father     Social History Social History   Tobacco Use  . Smoking status: Current Every Day Smoker    Packs/day: 0.10    Years: 20.00    Pack years: 2.00    Types: Cigarettes  . Smokeless tobacco: Never Used  Substance Use Topics  . Alcohol use: No  . Drug use: No    Review of Systems Constitutional: No fever/chills Eyes: No visual  changes. ENT: No sore throat. No stiff neck no neck pain Cardiovascular: + chest pain. Respiratory: Denies shortness of breath or baseline. Gastrointestinal:   no vomiting.  No diarrhea.  No constipation. Genitourinary: Negative for dysuria. Musculoskeletal: Negative lower extremity swelling Skin: Negative for rash. Neurological: Negative for severe headaches, focal weakness or numbness.   ____________________________________________   PHYSICAL EXAM:  VITAL SIGNS: ED Triage Vitals  Enc Vitals Group     BP 02/25/18 0744 109/71     Pulse Rate 02/25/18 0744 87     Resp 02/25/18 0744 18     Temp 02/25/18 0744 98 F (36.7 C)     Temp Source 02/25/18 0744 Oral     SpO2 02/25/18 0744 92 %     Weight 02/25/18 0742 180 lb 12.4 oz (82 kg)     Height 02/25/18 0742  5\' 6"  (1.676 m)     Head Circumference --      Peak Flow --      Pain Score 02/25/18 0741 7     Pain Loc --      Pain Edu? --      Excl. in [place]? --     Constitutional: Alert and oriented. Well appearing and in no acute distress.  Lightly somnolent which she states is because she does not feel well Eyes: Conjunctivae are normal Head: Atraumatic HEENT: No congestion/rhinnorhea. Mucous membranes are moist.  Oropharynx non-erythematous Neck:   Nontender with no meningismus, no masses, no stridor Cardiovascular: Normal rate, regular rhythm. Grossly normal heart sounds.  Good peripheral circulation. Chest: Tender palpation left S1 to touch that area patient states "ouch that the pain right there" and pulls back no crepitus no flail chest no shingles noted, this exactly reproduces her discomfort.  Also when she pulls up in the bed it hurts she states. Respiratory: Normal respiratory effort.  No retractions. Lungs CTAB. Abdominal: Soft and nontender. No distention. No guarding no rebound Back:  There is no focal tenderness or step off.  there is no midline tenderness there are no lesions noted. there is no CVA  tenderness Musculoskeletal: No lower extremity tenderness, no upper extremity tenderness. No joint effusions, no DVT signs strong distal pulses no edema Neurologic:  Normal speech and language. No gross focal neurologic deficits are appreciated.  Skin:  Skin is warm, dry and intact. No rash noted. Psychiatric: Mood and affect are normal. Speech and behavior are normal.  ____________________________________________   LABS (all labs ordered are listed, but only abnormal results are displayed)  Labs Reviewed  CBC - Abnormal; Notable for the following components:      Result Value   WBC 3.5 (*)    RDW 16.4 (*)    All other components within normal limits  BASIC METABOLIC PANEL  TROPONIN I  ETHANOL  BLOOD GAS, VENOUS    Pertinent labs  results that were available during my care of the patient were reviewed by me and considered in my medical decision making (see chart for details). ____________________________________________  EKG  I personally interpreted any EKGs ordered by me or triage Sinus rhythm rate 71 bpm, PVCs noted, right bundle branch block, no significant change from prior aside from PVCs ____________________________________________  RADIOLOGY  Pertinent labs & imaging results that were available during my care of the patient were reviewed by me and considered in my medical decision making (see chart for details). If possible, patient and/or family made aware of any abnormal findings.  No results found. ____________________________________________    PROCEDURES  Procedure(s) performed: None  Procedures  Critical Care performed: None  ____________________________________________   INITIAL IMPRESSION / ASSESSMENT AND PLAN / ED COURSE  Pertinent labs & imaging results that were available during my care of the patient were reviewed by me and considered in my medical decision making (see chart for details).  She is here with very reproducible chest pain for  several weeks.  Ever since her coughing pneumonia.  Certainly could have had a rib injury, her exam is certainly consistent with that.  Low suspicion for pneumothorax pneumonia pericarditis or any other acute intrathoracic pathology of that variety.  Her history certainly is not consistent with ACS but we will send her troponin, we will obtain electrolytes because of her history of dialysis which she did not complete today, she does appear to be slightly somnolent, she states this is  because she has not been feeling well recently I will get a VBG and EtOH.  She denies alcohol abuse.  I will make sure she is not retaining fluid.  Chest x-ray and all results are pending at this time.  Patient in no acute distress and this is again a very chronic reproducible chest wall discomfort.  ----------------------------------------- 10:37 AM on 02/25/2018 -----------------------------------------  Resting comfortably, her blood is hemolyzed x2, we have resent to the third time.  ----------------------------------------- 2:57 PM on 02/25/2018 -----------------------------------------  I actively touch her chest she has no pain, this is the same pain she has had for a long time.  Because of the question about a prior PE, which was somewhat interpreted to be a radiology artifactual reading, I did send her for a CT scan given her pain since that time.  CT scan shows no evidence of a PE.  Patient is very comfortable this time.  Sats have never dropped below 90 on her home oxygen, if blood pressures are reassuring, potassium is reassuring.  I talked to Dr. Clayborn Bigness, her personal cardiologist who is known her for 20 years, he does not feel she should be admitted to the hospital for cardiac reasons.  He is very satisfied with our work-up thus far does not feel any further  work-up is needed for this chronic atypical reproducible chest wall pain.  He and I discussed all of the findings including CT scan and serial troponins  EKG etc.  He feels the patient should go home.  I also talked to Dr. Holley Raring, appreciate both consult, he states that she can have dialysis tomorrow based on what we find today.      i  ____________________________________________   FINAL CLINICAL IMPRESSION(S) / ED DIAGNOSES  Final diagnoses:  None      This chart was dictated using voice recognition software.  Despite best efforts to proofread,  errors can occur which can change meaning.      Schuyler Amor, MD 02/25/18 0175    Schuyler Amor, MD 02/25/18 1037    Schuyler Amor, MD 02/25/18 1501

## 2018-02-25 NOTE — ED Notes (Signed)
Clamp removed from fistula in left arm.

## 2018-02-25 NOTE — ED Notes (Signed)
Attempted to place IV x 2 without success. Will place IV team consult.

## 2018-03-07 ENCOUNTER — Other Ambulatory Visit (INDEPENDENT_AMBULATORY_CARE_PROVIDER_SITE_OTHER): Payer: Self-pay | Admitting: Vascular Surgery

## 2018-03-07 DIAGNOSIS — N186 End stage renal disease: Secondary | ICD-10-CM

## 2018-03-10 ENCOUNTER — Encounter (INDEPENDENT_AMBULATORY_CARE_PROVIDER_SITE_OTHER): Payer: Self-pay | Admitting: Vascular Surgery

## 2018-03-10 ENCOUNTER — Ambulatory Visit (INDEPENDENT_AMBULATORY_CARE_PROVIDER_SITE_OTHER): Payer: Medicare Other | Admitting: Vascular Surgery

## 2018-03-10 ENCOUNTER — Ambulatory Visit (INDEPENDENT_AMBULATORY_CARE_PROVIDER_SITE_OTHER): Payer: Medicare Other

## 2018-03-10 VITALS — BP 122/85 | HR 87 | Resp 17 | Ht 66.0 in | Wt 171.0 lb

## 2018-03-10 DIAGNOSIS — N186 End stage renal disease: Secondary | ICD-10-CM | POA: Diagnosis not present

## 2018-03-10 DIAGNOSIS — F17210 Nicotine dependence, cigarettes, uncomplicated: Secondary | ICD-10-CM | POA: Diagnosis not present

## 2018-03-10 DIAGNOSIS — I120 Hypertensive chronic kidney disease with stage 5 chronic kidney disease or end stage renal disease: Secondary | ICD-10-CM

## 2018-03-10 DIAGNOSIS — Z992 Dependence on renal dialysis: Secondary | ICD-10-CM | POA: Diagnosis not present

## 2018-03-10 NOTE — Progress Notes (Signed)
Subjective:    Patient ID: [name], female    DOB: June 28, 1960, 57 y.o.   MRN: [id number] Chief Complaint  Patient presents with  . Follow-up    6 month HDA   Patient presents for "33-month follow-up".  The patient was last seen on September 22, 2016. The patient underwent a duplex ultrasound of the AV access which was notable for a patent fistula without any significant hemodynamic stenosis. Patient reports his hemodialysis doppler flow is 1572. The patient denies any issues with hemodialysis such as cannulation problems, increased bleeding, decrease in doppler flow or recirculation. The patient also denies any fistula skin breakdown, pain, edema, pallor or ulceration of the arm / hand.  The patient denies any uremic symptoms.  The patient denies any fever, nausea vomiting.  Review of Systems  Constitutional: Negative.   HENT: Negative.   Eyes: Negative.   Respiratory: Negative.   Cardiovascular: Negative.   Gastrointestinal: Negative.   Endocrine: Negative.   Genitourinary:       ESRD  Musculoskeletal: Negative.   Skin: Negative.   Allergic/Immunologic: Negative.   Neurological: Negative.   Hematological: Negative.   Psychiatric/Behavioral: Negative.       Objective:   Physical Exam  Constitutional: She is oriented to person, place, and time. She appears well-developed and well-nourished. No distress.  HENT:  Head: Normocephalic and atraumatic.  Right Ear: External ear normal.  Left Ear: External ear normal.  Eyes: Pupils are equal, round, and reactive to light. Conjunctivae are normal.  Neck: Normal range of motion.  Cardiovascular: Normal rate, regular rhythm, normal heart sounds and intact distal pulses.  Pulses:      Radial pulses are 2+ on the right side, and 2+ on the left side.  Left upper extremity: Skin is intact.  Good bruit and thrill.  Good capillary refill.  Pulmonary/Chest: Effort normal and breath sounds normal.  Musculoskeletal: Normal range of motion.  She exhibits no edema.  Neurological: She is alert and oriented to person, place, and time.  Skin: Skin is warm and dry. She is not diaphoretic.  Psychiatric: She has a normal mood and affect. Her behavior is normal. Judgment and thought content normal.  Vitals reviewed.  BP 122/85 (BP Location: Right Arm, Patient Position: Sitting)   Pulse 87   Resp 17   Ht 5\' 6"  (1.676 m)   Wt 171 lb (77.6 kg)   BMI 27.60 kg/m   Past Medical History:  Diagnosis Date  . CHF (congestive heart failure) ([place])   . Heart failure ([place])   . Hyperlipidemia   . Hypertension   . Myocardial infarction ([place])   . Paroxysmal atrial fibrillation (HCC)   . Renal failure   . Renal insufficiency   . Stroke [facility]) 2011   Social History   Socioeconomic History  . Marital status: Single    Spouse name: Not on file  . Number of children: Not on file  . Years of education: Not on file  . Highest education level: Not on file  Occupational History  . Not on file  Social Needs  . Financial resource strain: Not on file  . Food insecurity:    Worry: Not on file    Inability: Not on file  . Transportation needs:    Medical: Not on file    Non-medical: Not on file  Tobacco Use  . Smoking status: Current Every Day Smoker    Packs/day: 0.10    Years: 20.00    Pack years:  2.00    Types: Cigarettes  . Smokeless tobacco: Never Used  Substance and Sexual Activity  . Alcohol use: No  . Drug use: No  . Sexual activity: Not Currently  Lifestyle  . Physical activity:    Days per week: Not on file    Minutes per session: Not on file  . Stress: Not on file  Relationships  . Social connections:    Talks on phone: Not on file    Gets together: Not on file    Attends religious service: Not on file    Active member of club or organization: Not on file    Attends meetings of clubs or organizations: Not on file    Relationship status: Not on file  . Intimate partner violence:    Fear of current or ex partner:  Not on file    Emotionally abused: Not on file    Physically abused: Not on file    Forced sexual activity: Not on file  Other Topics Concern  . Not on file  Social History Narrative  . Not on file   Past Surgical History:  Procedure Laterality Date  . CORONARY ANGIOPLASTY WITH STENT PLACEMENT  2013  . CORONARY ARTERY BYPASS GRAFT    . DIALYSIS FISTULA CREATION     Family History  Problem Relation Age of Onset  . Hypertension Unknown   . Cancer Unknown   . Renal Disease Unknown   . Ovarian cancer Mother   . Kidney disease Father    Allergies  Allergen Reactions  . Morphine And Related Hives  . Latex Rash  . Tape Rash      Assessment & Plan:  Patient presents for "73-month follow-up".  The patient was last seen on September 22, 2016. The patient underwent a duplex ultrasound of the AV access which was notable for a patent fistula without any significant hemodynamic stenosis. Patient reports his hemodialysis doppler flow is 1572. The patient denies any issues with hemodialysis such as cannulation problems, increased bleeding, decrease in doppler flow or recirculation. The patient also denies any fistula skin breakdown, pain, edema, pallor or ulceration of the arm / hand.  The patient denies any uremic symptoms.  The patient denies any fever, nausea vomiting.  1. End stage renal disease with hypertension ([place]) - Stable Studies reviewed with patient. The patient is doing well and currently has adequate dialysis access. Duplex ultrasound of the AV access shows a patent access with no evidence of hemodynamically significant strictures or stenosis.  The patient should continue to have duplex ultrasounds of the dialysis access every six months. The patient was instructed to call the office in the interim if any issues with dialysis access / doppler flow, pain, edema, pallor, fistula skin breakdown or ulceration of the arm / hand occur. The patient expressed their understanding.  - VAS US  DUPLEX DIALYSIS ACCESS (AVF,AVG); Future  2. ESRD on hemodialysis (HCC) - Stable As above  - VAS US DUPLEX DIALYSIS ACCESS (AVF,AVG); Future  Current Outpatient Medications on File Prior to Visit  Medication Sig Dispense Refill  . aspirin 81 MG tablet Take 81 mg by mouth every evening.     . cefdinir (OMNICEF) 300 MG capsule Take 1 tablet on 8/23 after dialysis 1 capsule 0  . cinacalcet (SENSIPAR) 60 MG tablet Take 60 mg by mouth every evening.     Marland Kitchen ELIQUIS 5 MG TABS tablet Take 1 tablet (5 mg total) by mouth 2 (two) times daily. 60 tablet 0  .  hydrOXYzine (ATARAX/VISTARIL) 50 MG tablet Take 50 mg by mouth 2 (two) times daily.     Marland Kitchen levothyroxine (SYNTHROID, LEVOTHROID) 88 MCG tablet     . oxyCODONE (ROXICODONE) 5 MG immediate release tablet Take 1 tablet (5 mg total) by mouth every 8 (eight) hours as needed. 8 tablet 0  . pantoprazole (PROTONIX) 40 MG tablet Take 1 tablet by mouth daily.    . sevelamer carbonate (RENVELA) 800 MG tablet Take 4 tablets (3200MG ) by mouth 3 times daily with meals and 2 tablets (1600MG ) by mouth twice daily with snacks     No current facility-administered medications on file prior to visit.    There are no Patient Instructions on file for this visit. No follow-ups on file.  Rudy Domek A Alayjah Boehringer, PA-C

## 2018-03-27 IMAGING — DX DG CHEST 1V PORT
1 series · 1 of 1 positions shown · non-contrast
Comparison: 01/13/2016

CLINICAL DATA: Back pain pain under the left breast

EXAM:
PORTABLE CHEST 1 VIEW

[chest ap]
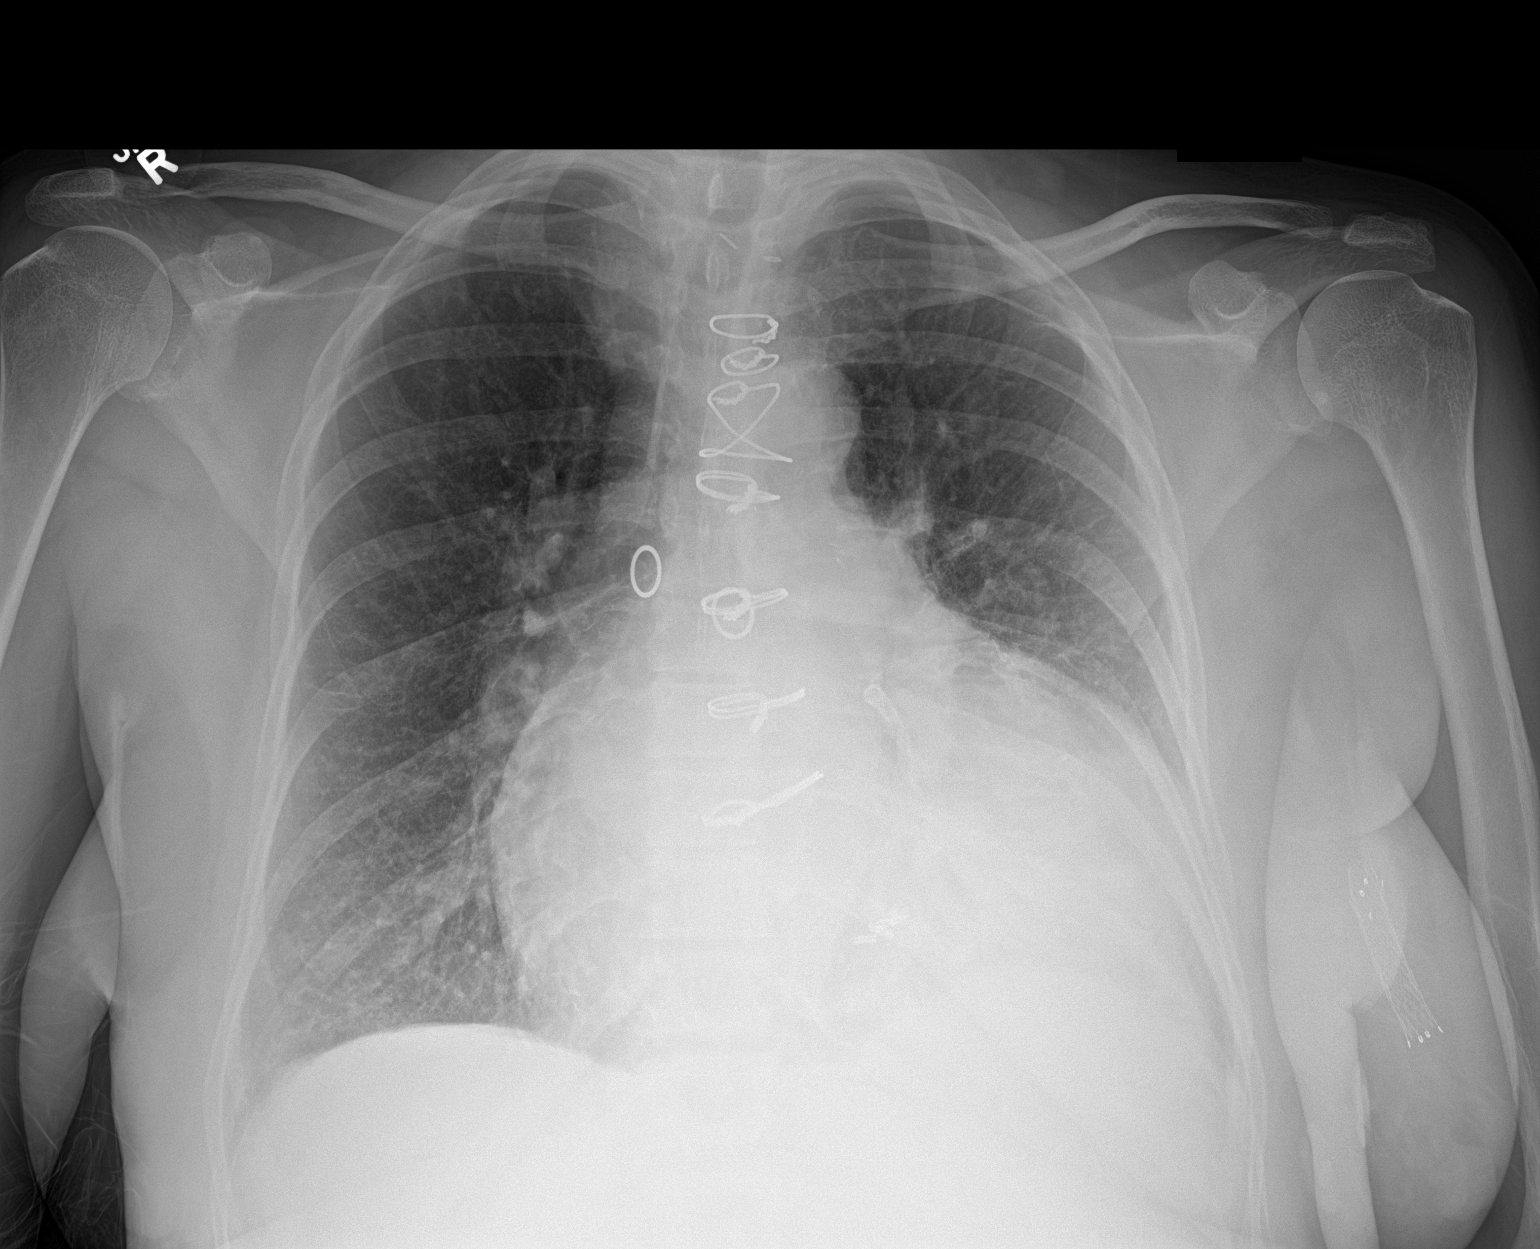

[1 of 1 positions shown; findings below may reference images not displayed]

FINDINGS: Median sternotomy wires and surgical clips are evident.
Moderate-to-marked globular cardiomegaly. No overt failure. No
effusion. No pneumothorax.
IMPRESSION: Moderate to marked cardiomegaly. Cannot exclude mild atelectasis or
infiltrate in the left base.

## 2018-03-27 IMAGING — CT CT RENAL STONE PROTOCOL
2 of 4 series · 16 of 46 positions shown, 18 images · non-contrast
Comparison: None.

CLINICAL DATA: Initial evaluation for acute severe left upper
quadrant/left flank pain. History of dialysis.

EXAM:
CT ABDOMEN AND PELVIS WITHOUT CONTRAST
TECHNIQUE: Multidetector CT imaging of the abdomen and pelvis was performed
following the standard protocol without IV contrast.

[Series 2: axial st · axial · 0.73mm/px · z∈[-668,-288]mm · 13 of 84 slices shown, 15 images]
[im 4/84  soft-tissue]
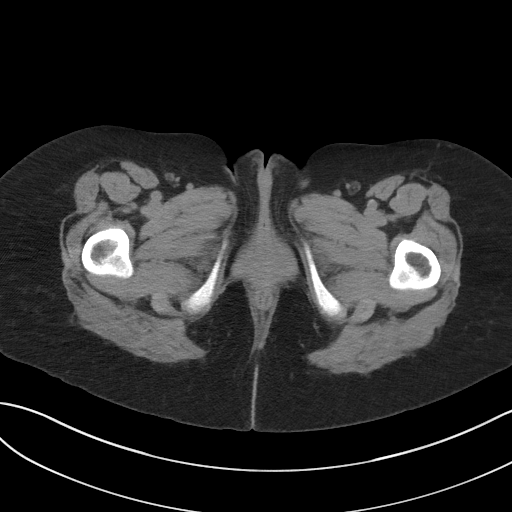
[im 4/84  bone]
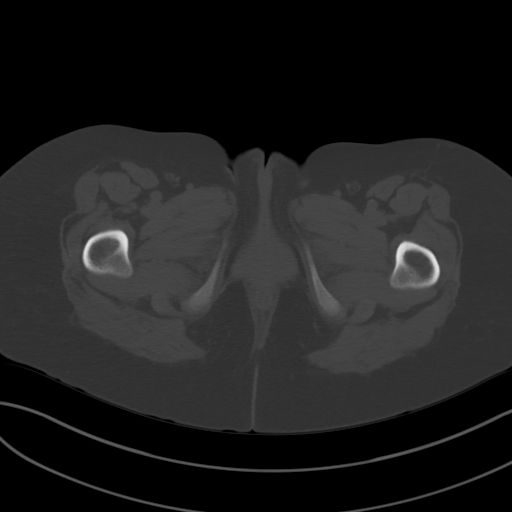
[im 11/84  soft-tissue]
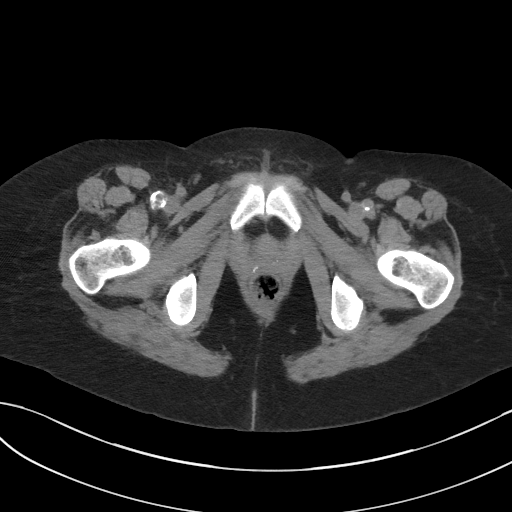
[im 18/84  soft-tissue]
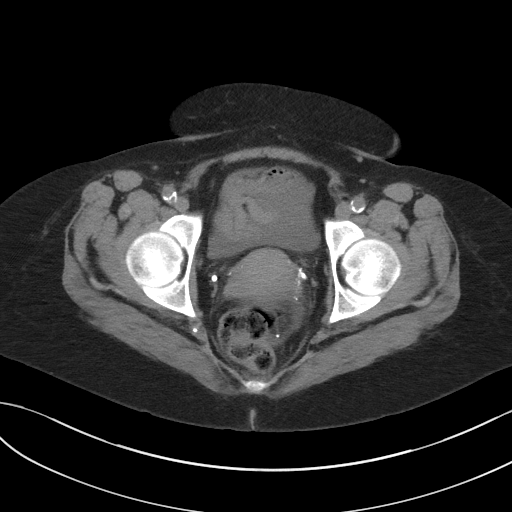
[im 25/84  soft-tissue]
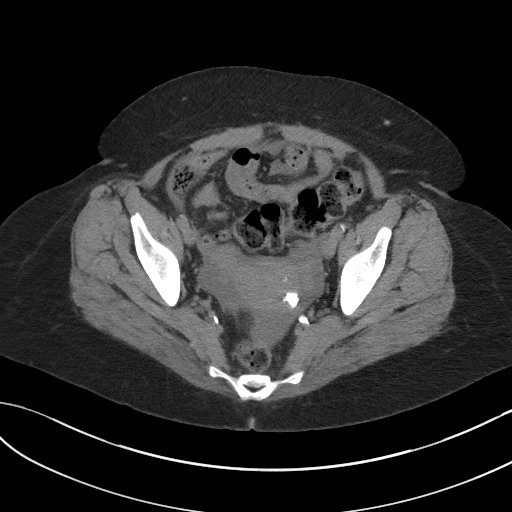
[im 28/84  soft-tissue]
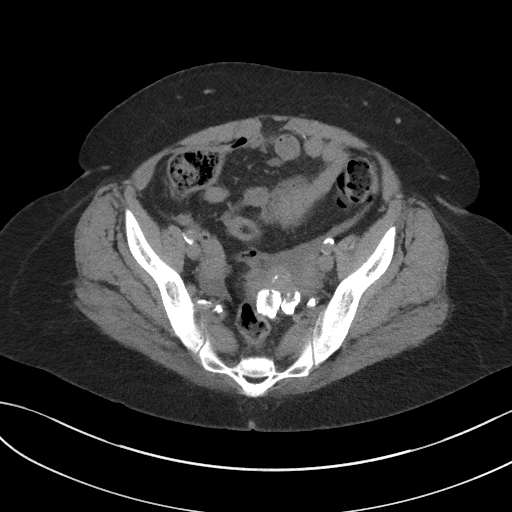
[im 35/84  soft-tissue]
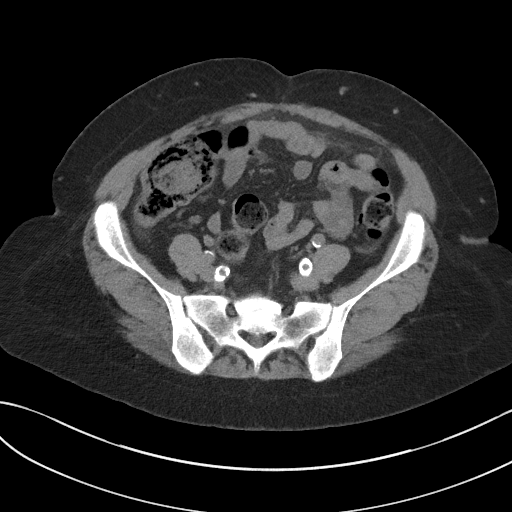
[im 42/84  soft-tissue]
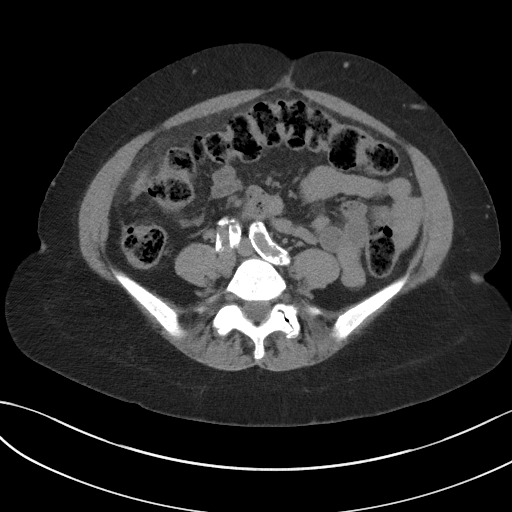
[im 49/84  soft-tissue]
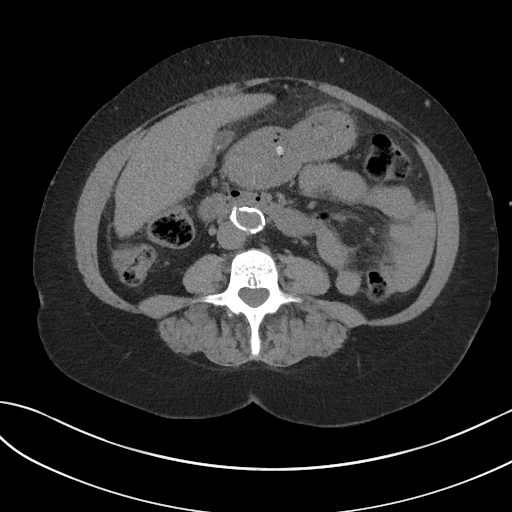
[im 56/84  soft-tissue]
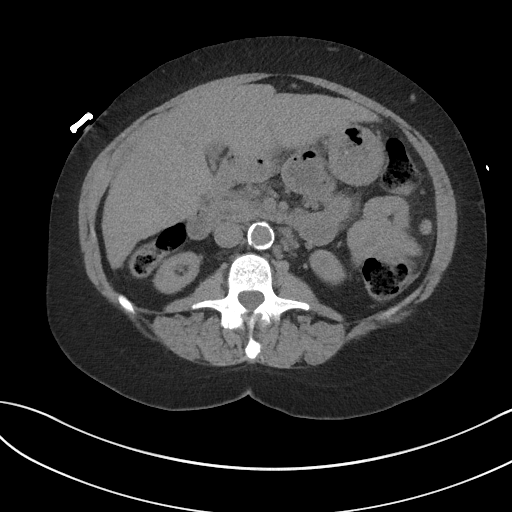
[im 56/84  bone]
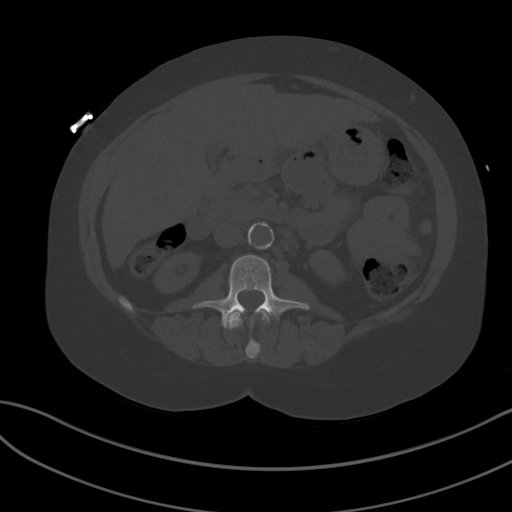
[im 59/84  soft-tissue]
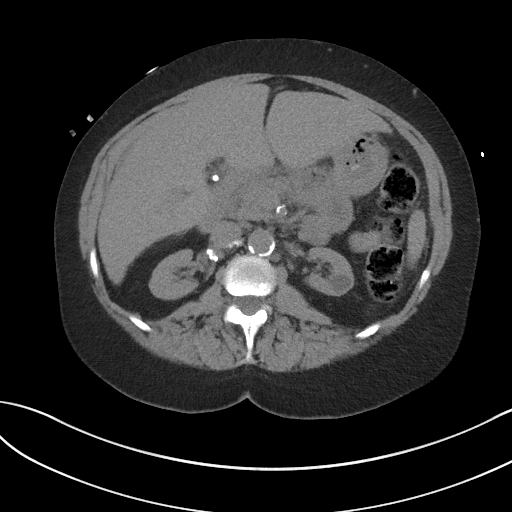
[im 66/84  soft-tissue]
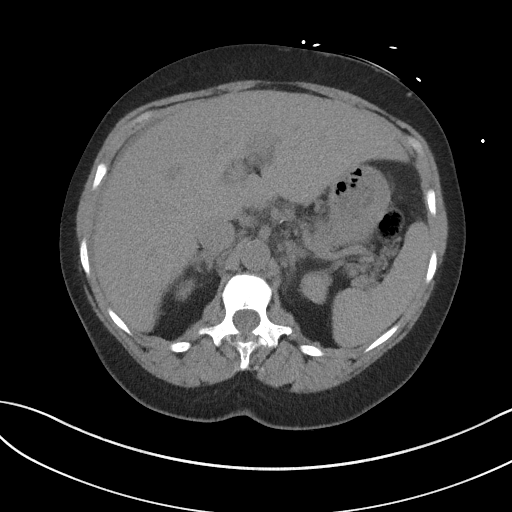
[im 73/84  soft-tissue]
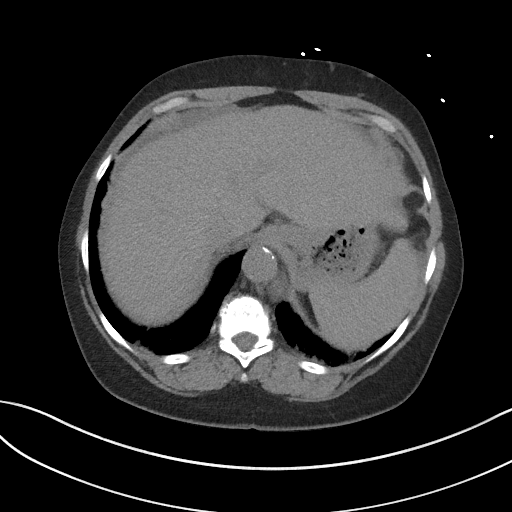
[im 80/84  soft-tissue]
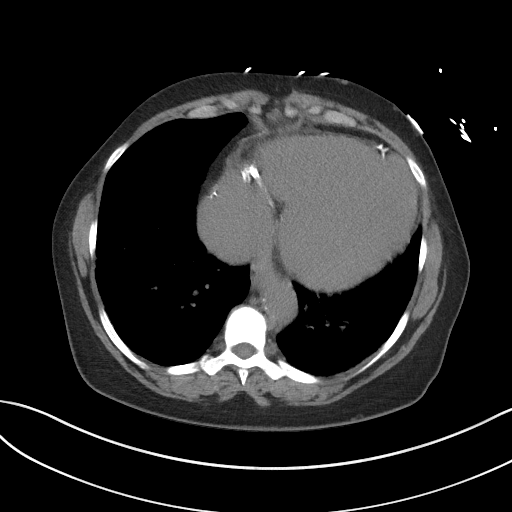

[Series 5: coronal · coronal · 0.82mm/px · 3 of 139 slices shown]
[im 47/139  soft-tissue]
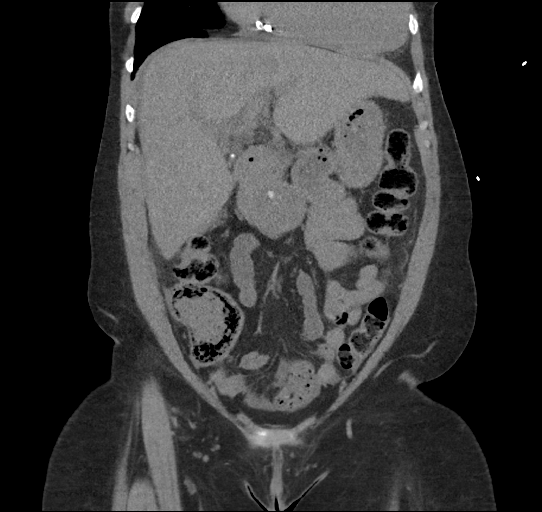
[im 62/139  soft-tissue]
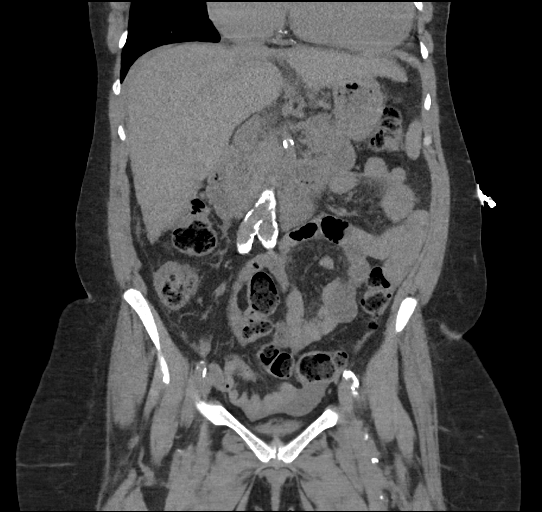
[im 77/139  soft-tissue]
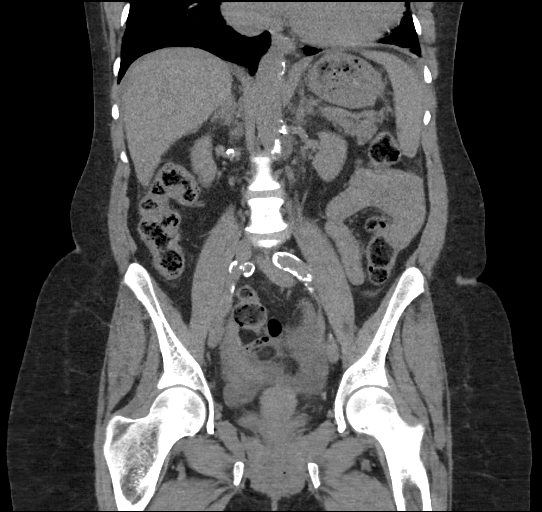

[16 of 46 positions shown; findings below may reference images not displayed]

FINDINGS: Lower chest: Scattered atelectatic changes present within the
visualized lung bases, greater on the left. Moderate to severe
cardiomegaly partially visualized. Prominent coronary artery
calcifications. No pleural or pericardial effusion.

Hepatobiliary: The liver demonstrates a somewhat nodular contour,
suggestive of cirrhosis. No focal intrahepatic lesions identified on
this noncontrast examination. Gallbladder is contracted with a few
stones present. No free pericholecystic fluid. No biliary
dilatation.

Pancreas: Pancreas within normal limits.

Spleen: Spleen within normal limits.

Adrenals/Urinary Tract: Adrenal glands within normal limits. Kidneys
small and atrophic bilaterally. 15 mm cyst present within the left
kidney. Few additional scattered sub cm hypodensities within the
bilateral kidneys too small the characterize, but statistically
likely reflects cysts as well. No nephrolithiasis or hydronephrosis.
No radiopaque calculi seen along the course of either ureter. No
hydroureter. Bladder largely decompressed without obvious
abnormality.

Stomach/Bowel: Stomach within normal limits. No evidence for bowel
obstruction. Appendix within normal limits. No acute inflammatory
changes about the bowels.

Vascular/Lymphatic: Extensive atheromatous plaque throughout the
intra-abdominal aorta and its branch vessels. No aneurysm. No
pathologically enlarged intra-abdominal or pelvic lymph nodes.

Reproductive: 2 adjacent calcified lesions arising from the uterus
compatible with fibroids. These measure approximately 2 cm each.
Uterus otherwise unremarkable. Ovaries within normal limits.

Other: No free intraperitoneal air. Small volume free fluid within
the abdomen and pelvis. Hazy omental edema, suspected to be related
to overall volume status. Small paraumbilical fat containing hernia.

Musculoskeletal: Visualized osseous structures diffusely sclerotic,
likely related underlying renal osteodystrophy. No acute osseous
abnormality. No worrisome lytic or blastic osseous lesions. Facet
arthropathy noted within the lower lumbar spine.
IMPRESSION: 1. No CT evidence for nephrolithiasis or obstructive uropathy.
2. Atrophic kidneys bilaterally, compatible with end-stage renal
disease.
3. Nodular contour of the liver, suggestive of possible cirrhosis.
Correlation with LFTs recommended.
4. Small volume ascites within the abdomen and pelvis.
5. Cholelithiasis.
6. Fibroid uterus.
7. Severe cardiomegaly with advanced coronary artery calcifications
and atherosclerotic disease throughout the intra-abdominal aorta and
its branch vessels.

## 2018-04-12 ENCOUNTER — Encounter

## 2018-04-12 ENCOUNTER — Encounter (INDEPENDENT_AMBULATORY_CARE_PROVIDER_SITE_OTHER): Payer: Medicare Other

## 2018-04-12 ENCOUNTER — Ambulatory Visit (INDEPENDENT_AMBULATORY_CARE_PROVIDER_SITE_OTHER): Payer: Medicare Other | Admitting: Vascular Surgery

## 2018-04-21 ENCOUNTER — Emergency Department: Payer: Medicare Other

## 2018-04-21 ENCOUNTER — Observation Stay
Admission: EM | Admit: 2018-04-21 | Discharge: 2018-04-22 | Disposition: A | Discharge status: Home / Self Care | Payer: Medicare Other | Attending: Internal Medicine | Admitting: Internal Medicine

## 2018-04-21 ENCOUNTER — Other Ambulatory Visit: Payer: Self-pay

## 2018-04-21 DIAGNOSIS — Z79890 Hormone replacement therapy (postmenopausal): Secondary | ICD-10-CM | POA: Insufficient documentation

## 2018-04-21 DIAGNOSIS — Z79899 Other long term (current) drug therapy: Secondary | ICD-10-CM | POA: Insufficient documentation

## 2018-04-21 DIAGNOSIS — E785 Hyperlipidemia, unspecified: Secondary | ICD-10-CM | POA: Insufficient documentation

## 2018-04-21 DIAGNOSIS — Z8673 Personal history of transient ischemic attack (TIA), and cerebral infarction without residual deficits: Secondary | ICD-10-CM | POA: Insufficient documentation

## 2018-04-21 DIAGNOSIS — R079 Chest pain, unspecified: Secondary | ICD-10-CM | POA: Diagnosis present

## 2018-04-21 DIAGNOSIS — I252 Old myocardial infarction: Secondary | ICD-10-CM | POA: Insufficient documentation

## 2018-04-21 DIAGNOSIS — I480 Paroxysmal atrial fibrillation: Secondary | ICD-10-CM | POA: Diagnosis not present

## 2018-04-21 DIAGNOSIS — Z7901 Long term (current) use of anticoagulants: Secondary | ICD-10-CM | POA: Insufficient documentation

## 2018-04-21 DIAGNOSIS — Z86711 Personal history of pulmonary embolism: Secondary | ICD-10-CM | POA: Diagnosis not present

## 2018-04-21 DIAGNOSIS — E876 Hypokalemia: Secondary | ICD-10-CM | POA: Diagnosis not present

## 2018-04-21 DIAGNOSIS — I959 Hypotension, unspecified: Secondary | ICD-10-CM | POA: Insufficient documentation

## 2018-04-21 DIAGNOSIS — N186 End stage renal disease: Secondary | ICD-10-CM | POA: Insufficient documentation

## 2018-04-21 DIAGNOSIS — Z955 Presence of coronary angioplasty implant and graft: Secondary | ICD-10-CM | POA: Insufficient documentation

## 2018-04-21 DIAGNOSIS — Z992 Dependence on renal dialysis: Secondary | ICD-10-CM | POA: Insufficient documentation

## 2018-04-21 DIAGNOSIS — D631 Anemia in chronic kidney disease: Secondary | ICD-10-CM | POA: Diagnosis not present

## 2018-04-21 DIAGNOSIS — Z7982 Long term (current) use of aspirin: Secondary | ICD-10-CM | POA: Diagnosis not present

## 2018-04-21 DIAGNOSIS — I2510 Atherosclerotic heart disease of native coronary artery without angina pectoris: Secondary | ICD-10-CM | POA: Diagnosis not present

## 2018-04-21 DIAGNOSIS — N2581 Secondary hyperparathyroidism of renal origin: Secondary | ICD-10-CM | POA: Diagnosis not present

## 2018-04-21 DIAGNOSIS — Z9981 Dependence on supplemental oxygen: Secondary | ICD-10-CM | POA: Diagnosis not present

## 2018-04-21 DIAGNOSIS — I5022 Chronic systolic (congestive) heart failure: Secondary | ICD-10-CM | POA: Diagnosis not present

## 2018-04-21 DIAGNOSIS — I132 Hypertensive heart and chronic kidney disease with heart failure and with stage 5 chronic kidney disease, or end stage renal disease: Secondary | ICD-10-CM | POA: Diagnosis not present

## 2018-04-21 DIAGNOSIS — F0150 Vascular dementia without behavioral disturbance: Secondary | ICD-10-CM | POA: Insufficient documentation

## 2018-04-21 DIAGNOSIS — F17210 Nicotine dependence, cigarettes, uncomplicated: Secondary | ICD-10-CM | POA: Insufficient documentation

## 2018-04-21 DIAGNOSIS — I272 Pulmonary hypertension, unspecified: Secondary | ICD-10-CM | POA: Insufficient documentation

## 2018-04-21 DIAGNOSIS — Z951 Presence of aortocoronary bypass graft: Secondary | ICD-10-CM | POA: Diagnosis not present

## 2018-04-21 LAB — CBC
HCT: 39 % (ref 36.0–46.0)
Hemoglobin: 12.3 g/dL (ref 12.0–15.0)
MCH: 29.9 pg (ref 26.0–34.0)
MCHC: 31.5 g/dL (ref 30.0–36.0)
MCV: 94.9 fL (ref 80.0–100.0)
PLATELETS: 136 10*3/uL — AB (ref 150–400)
RBC: 4.11 MIL/uL (ref 3.87–5.11)
RDW: 15.9 % — ABNORMAL HIGH (ref 11.5–15.5)
WBC: 3.9 10*3/uL — ABNORMAL LOW (ref 4.0–10.5)
nRBC: 0 % (ref 0.0–0.2)

## 2018-04-21 LAB — TROPONIN I
Troponin I: 0.06 ng/mL (ref <0.03)
Troponin I: 0.06 ng/mL (ref <0.03)

## 2018-04-21 LAB — BASIC METABOLIC PANEL
Anion gap: 10 (ref 5–15)
BUN: 36 mg/dL — ABNORMAL HIGH (ref 6–20)
CALCIUM: 8.1 mg/dL — AB (ref 8.9–10.3)
CO2: 28 mmol/L (ref 22–32)
Chloride: 103 mmol/L (ref 98–111)
Creatinine, Ser: 6.32 mg/dL — ABNORMAL HIGH (ref 0.44–1.00)
GFR calc non Af Amer: 7 mL/min — ABNORMAL LOW (ref >60)
GFR, EST AFRICAN AMERICAN: 8 mL/min — AB (ref >60)
Glucose, Bld: 92 mg/dL (ref 70–99)
Potassium: 3.4 mmol/L — ABNORMAL LOW (ref 3.5–5.1)
Sodium: 141 mmol/L (ref 135–145)

## 2018-04-21 LAB — PROTIME-INR
INR: 2.39
Prothrombin Time: 25.7 seconds — ABNORMAL HIGH (ref 11.4–15.2)

## 2018-04-21 MED ORDER — INFLUENZA VAC SPLIT QUAD 0.5 ML IM SUSY: 0.5000 mL | PREFILLED_SYRINGE | INTRAMUSCULAR | Status: DC

## 2018-04-21 MED ORDER — OXYCODONE HCL 5 MG PO TABS
5.0000 mg | ORAL_TABLET | ORAL | Status: DC | PRN
  Administered 2018-04-21: 5 mg via ORAL
  Filled 2018-04-21: qty 1

## 2018-04-21 MED ORDER — OXYCODONE-ACETAMINOPHEN 5-325 MG PO TABS
2.0000 | ORAL_TABLET | Freq: Once | ORAL | Status: AC
  Administered 2018-04-21: 2 via ORAL
  Filled 2018-04-21: qty 2

## 2018-04-21 MED ORDER — SODIUM CHLORIDE 0.9 % IV SOLN: 250.0000 mL | INTRAVENOUS | Status: DC | PRN

## 2018-04-21 MED ORDER — SEVELAMER CARBONATE 800 MG PO TABS
3200.0000 mg | ORAL_TABLET | Freq: Three times a day (TID) | ORAL | Status: DC
  Administered 2018-04-22 (×3): 3200 mg via ORAL
  Filled 2018-04-21 (×3): qty 4

## 2018-04-21 MED ORDER — APIXABAN 5 MG PO TABS
5.0000 mg | ORAL_TABLET | Freq: Two times a day (BID) | ORAL | Status: DC
  Administered 2018-04-21 – 2018-04-22 (×2): 5 mg via ORAL
  Filled 2018-04-21 (×2): qty 1

## 2018-04-21 MED ORDER — HYDROXYZINE HCL 25 MG PO TABS
50.0000 mg | ORAL_TABLET | Freq: Two times a day (BID) | ORAL | Status: DC | PRN
  Administered 2018-04-22 (×2): 50 mg via ORAL
  Filled 2018-04-21 (×2): qty 2

## 2018-04-21 MED ORDER — ALBUTEROL SULFATE (2.5 MG/3ML) 0.083% IN NEBU
2.5000 mg | INHALATION_SOLUTION | RESPIRATORY_TRACT | Status: DC | PRN
  Administered 2018-04-22: 2.5 mg via RESPIRATORY_TRACT
  Filled 2018-04-21: qty 3

## 2018-04-21 MED ORDER — ONDANSETRON HCL 4 MG/2ML IJ SOLN: 4.0000 mg | Freq: Four times a day (QID) | INTRAMUSCULAR | Status: DC | PRN

## 2018-04-21 MED ORDER — SODIUM CHLORIDE 0.9% FLUSH
3.0000 mL | Freq: Two times a day (BID) | INTRAVENOUS | Status: DC
  Administered 2018-04-21 – 2018-04-22 (×2): 3 mL via INTRAVENOUS

## 2018-04-21 MED ORDER — ACETAMINOPHEN 325 MG PO TABS: 650.0000 mg | ORAL_TABLET | Freq: Four times a day (QID) | ORAL | Status: DC | PRN

## 2018-04-21 MED ORDER — CINACALCET HCL 30 MG PO TABS
60.0000 mg | ORAL_TABLET | Freq: Every evening | ORAL | Status: DC
  Filled 2018-04-21: qty 2

## 2018-04-21 MED ORDER — ROSUVASTATIN CALCIUM 10 MG PO TABS
40.0000 mg | ORAL_TABLET | Freq: Every day | ORAL | Status: DC
  Administered 2018-04-21 – 2018-04-22 (×2): 40 mg via ORAL
  Filled 2018-04-21 (×2): qty 4

## 2018-04-21 MED ORDER — SEVELAMER CARBONATE 800 MG PO TABS
1600.0000 mg | ORAL_TABLET | Freq: Two times a day (BID) | ORAL | Status: DC
  Filled 2018-04-21: qty 2

## 2018-04-21 MED ORDER — ACETAMINOPHEN 650 MG RE SUPP: 650.0000 mg | Freq: Four times a day (QID) | RECTAL | Status: DC | PRN

## 2018-04-21 MED ORDER — ASPIRIN 81 MG PO CHEW
81.0000 mg | CHEWABLE_TABLET | Freq: Every day | ORAL | Status: DC
  Administered 2018-04-22: 81 mg via ORAL
  Filled 2018-04-21: qty 1

## 2018-04-21 MED ORDER — MIDODRINE HCL 5 MG PO TABS
10.0000 mg | ORAL_TABLET | Freq: Every day | ORAL | Status: DC
  Administered 2018-04-22: 10 mg via ORAL
  Filled 2018-04-21 (×2): qty 2

## 2018-04-21 MED ORDER — NITROGLYCERIN 0.4 MG SL SUBL: 0.4000 mg | SUBLINGUAL_TABLET | SUBLINGUAL | Status: DC | PRN

## 2018-04-21 MED ORDER — CARVEDILOL 6.25 MG PO TABS
6.2500 mg | ORAL_TABLET | Freq: Two times a day (BID) | ORAL | Status: DC
  Administered 2018-04-21 – 2018-04-22 (×3): 6.25 mg via ORAL
  Filled 2018-04-21 (×3): qty 1

## 2018-04-21 MED ORDER — SACUBITRIL-VALSARTAN 24-26 MG PO TABS
1.0000 | ORAL_TABLET | Freq: Two times a day (BID) | ORAL | Status: DC
  Administered 2018-04-21 – 2018-04-22 (×2): 1 via ORAL
  Filled 2018-04-21 (×2): qty 1

## 2018-04-21 MED ORDER — SODIUM CHLORIDE 0.9% FLUSH: 3.0000 mL | INTRAVENOUS | Status: DC | PRN

## 2018-04-21 MED ORDER — CINACALCET HCL 30 MG PO TABS
60.0000 mg | ORAL_TABLET | Freq: Every day | ORAL | Status: DC
  Administered 2018-04-21 – 2018-04-22 (×2): 60 mg via ORAL
  Filled 2018-04-21 (×2): qty 2

## 2018-04-21 MED ORDER — ONDANSETRON HCL 4 MG PO TABS: 4.0000 mg | ORAL_TABLET | Freq: Four times a day (QID) | ORAL | Status: DC | PRN

## 2018-04-21 MED ORDER — PANTOPRAZOLE SODIUM 40 MG PO TBEC
40.0000 mg | DELAYED_RELEASE_TABLET | Freq: Every day | ORAL | Status: DC
  Administered 2018-04-21 – 2018-04-22 (×2): 40 mg via ORAL
  Filled 2018-04-21 (×2): qty 1

## 2018-04-21 MED ORDER — LEVOTHYROXINE SODIUM 88 MCG PO TABS
88.0000 ug | ORAL_TABLET | Freq: Every day | ORAL | Status: DC
  Administered 2018-04-21: 88 ug via ORAL
  Filled 2018-04-21: qty 1

## 2018-04-21 MED ORDER — PNEUMOCOCCAL VAC POLYVALENT 25 MCG/0.5ML IJ INJ: 0.5000 mL | INJECTION | INTRAMUSCULAR | Status: DC

## 2018-04-21 MED ORDER — SEVELAMER CARBONATE 800 MG PO TABS: 1600.0000 mg | ORAL_TABLET | Freq: Two times a day (BID) | ORAL | Status: DC

## 2018-04-21 NOTE — ED Triage Notes (Addendum)
Pt c/o increased chest pain for the past 2 weeks with SOB, nausea, diaphoresis, pt currently has a halter monitor on with a hx of open heart surgery. Pt in NAD on arrival. Pt is dialysis pt , MWF

## 2018-04-21 NOTE — ED Notes (Signed)
ED Provider at bedside. 

## 2018-04-21 NOTE — ED Provider Notes (Signed)
[facility] Emergency Department Provider Note       Time seen: ----------------------------------------- 3:53 PM on 04/21/2018 -----------------------------------------   I have reviewed the triage vital signs and the nursing notes.  HISTORY   Chief Complaint Chest Pain    HPI [name] is a 57 y.o. female with a history of CHF, hyperlipidemia, hypertension, MI, paroxysmal atrial fibrillation, renal failure, CVA on dialysis who presents to the ED for increased chest pain for the past 2 weeks with shortness of breath, nausea and diaphoresis.  Patient is currently on a Holter monitor due to palpitations.  She is on dialysis Monday Wednesday and Friday.  Past Medical History:  Diagnosis Date  . CHF (congestive heart failure) ([place])   . Heart failure ([place])   . Hyperlipidemia   . Hypertension   . Myocardial infarction ([place])   . Paroxysmal atrial fibrillation (H[facility])   . Renal failure   . Renal insufficiency   . Stroke [facility]) 2011    Patient Active Problem List   Diagnosis Date Noted  . Pneumonia 01/31/2018  . Swelling of limb 09/22/2016  . Kidney dialysis as the cause of abnormal reaction of the patient, or of later complication, without mention of misadventure at the time of the procedure (CODE) 03/24/2016  . Hyperkalemia 11/13/2014  . Generalized weakness 10/15/2014  . Chest pain 10/15/2014  . ESRD on hemodialysis ([place]) 10/15/2014  . Congestive heart failure (CHF) ([place]) 10/15/2014  . HTN (hypertension) 10/15/2014  . Sternal wound dehiscence 04/12/2014  . 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 Pericardial effusion 04/12/2014  . History of delirium 04/12/2014  . H/O atrial flutter 04/12/2014  . Delayed surgical wound healing 04/12/2014  . Chest wall pain following surgery 04/12/2014  . S/P CABG (coronary artery bypass graft) 04/02/2014  . Lactic acidosis 03/26/2014  . Arteriosclerotic dementia ([place]) 03/25/2014  . Atrial flutter by  electrocardiogram ([place]) 03/22/2014  . Superficial incisional surgical site infection 03/14/2014  . Postoperative anemia due to acute blood loss 02/27/2014  . Obesity 02/27/2014  . H/O: substance abuse ([place]) 02/27/2014  . Anemia of chronic renal failure 02/27/2014  . Acute postoperative pain 02/27/2014  . Leukocytosis 02/26/2014  . Hypotension 02/24/2014  . Exhausted vascular access 02/24/2014  . Anemia of chronic disease 02/24/2014  . Stroke ([place]) 02/21/2014  . S/P CABG x 3 02/21/2014  . Dialysis patient ([place]) 02/21/2014  . Hyperlipidemia 02/13/2014  . History of hepatitis C 02/13/2014  . HFrEF (heart failure with reduced ejection fraction) ([place]) 02/13/2014  . H/O stroke without residual deficits 02/13/2014  . ESRD (end stage renal disease) on dialysis ([place]) 02/13/2014  . End stage renal disease with hypertension ([place]) 02/13/2014  . CAD, multiple vessel 02/13/2014  . Retrosternal chest pain 05/30/2013  . Gallstones 05/29/2013    Past Surgical History:  Procedure Laterality Date  . CORONARY ANGIOPLASTY WITH STENT PLACEMENT  2013  . CORONARY ARTERY BYPASS GRAFT    . DIALYSIS FISTULA CREATION      Allergies Morphine and related; Latex; and Tape  Social History Social History   Tobacco Use  . Smoking status: Current Every Day Smoker    Packs/day: 0.10    Years: 20.00    Pack years: 2.00    Types: Cigarettes  . Smokeless tobacco: Never Used  Substance Use Topics  . Alcohol use: No  . Drug use: No   Review of Systems Constitutional: Negative for fever. Cardiovascular: Positive for chest pain, palpitations Respiratory: Negative for shortness of breath. Gastrointestinal:  Negative for abdominal pain, vomiting and diarrhea. Musculoskeletal: Negative for back pain. Skin: Negative for rash. Neurological: Negative for headaches, focal weakness or numbness.  All systems negative/normal/unremarkable except as stated in the  HPI  ____________________________________________   PHYSICAL EXAM:  VITAL SIGNS: ED Triage Vitals  Enc Vitals Group     BP 04/21/18 1344 104/65     Pulse Rate 04/21/18 1344 75     Resp 04/21/18 1344 17     Temp 04/21/18 1344 98.8 F (37.1 C)     Temp Source 04/21/18 1344 Oral     SpO2 04/21/18 1344 93 %     Weight 04/21/18 1341 174 lb (78.9 kg)     Height 04/21/18 1341 5\' 6"  (1.676 m)     Head Circumference --      Peak Flow --      Pain Score 04/21/18 1341 6     Pain Loc --      Pain Edu? --      Excl. in [place]? --    Constitutional: Alert and oriented. Well appearing and in no distress. Eyes: Conjunctivae are normal. Normal extraocular movements. ENT   Head: Normocephalic and atraumatic.   Nose: No congestion/rhinnorhea.   Mouth/Throat: Mucous membranes are moist.   Neck: No stridor. Cardiovascular: Normal rate, regular rhythm. No murmurs, rubs, or gallops. Respiratory: Normal respiratory effort without tachypnea nor retractions. Breath sounds are clear and equal bilaterally. No wheezes/rales/rhonchi. Gastrointestinal: Soft and nontender. Normal bowel sounds Musculoskeletal: Nontender with normal range of motion in extremities. No lower extremity tenderness nor edema. Neurologic:  Normal speech and language. No gross focal neurologic deficits are appreciated.  Skin:  Skin is warm, dry and intact. No rash noted. Psychiatric: Mood and affect are normal. Speech and behavior are normal.  ____________________________________________  EKG: Interpreted by me.  Sinus rhythm with right bundle branch block, rate of 78 bpm, prolonged PR interval, possible inferior or anterior infarct age-indeterminate.  ____________________________________________  ED COURSE:  As part of my medical decision making, I reviewed the following data within the [place] History obtained from family if available, nursing notes, old chart and ekg, as well as notes from prior  ED visits. Patient presented for chest pain and palpitations, we will assess with labs and imaging as indicated at this time.   Procedures ____________________________________________   LABS (pertinent positives/negatives)  Labs Reviewed  BASIC METABOLIC PANEL - Abnormal; Notable for the following components:      Result Value   Potassium 3.4 (*)    BUN 36 (*)    Creatinine, Ser 6.32 (*)    Calcium 8.1 (*)    GFR calc non Af Amer 7 (*)    GFR calc Af Amer 8 (*)    All other components within normal limits  CBC - Abnormal; Notable for the following components:   WBC 3.9 (*)    RDW 15.9 (*)    Platelets 136 (*)    All other components within normal limits  TROPONIN I - Abnormal; Notable for the following components:   Troponin I 0.06 (*)    All other components within normal limits  PROTIME-INR - Abnormal; Notable for the following components:   Prothrombin Time 25.7 (*)    All other components within normal limits    RADIOLOGY Images were viewed by me  Chest x-ray IMPRESSION: Stable cardiomegaly with central pulmonary vascular congestion.  ____________________________________________  DIFFERENTIAL DIAGNOSIS   Arrhythmia, MI, unstable angina, chronic pain, PE, pneumothorax, end-stage renal  disease  FINAL ASSESSMENT AND PLAN  Chest pain   Plan: The patient had presented for worsening chest pain. Patient's labs revealed stable chronic abnormalities. Patient's imaging did not reveal any acute process.  She has had persistent worsening chest pain and shortness of breath.  She feels like she is being under dialyzed because of borderline blood pressures.  She does have a significantly low EF.  I have discussed with cardiology and we will admit for observation.  It is unclear when her last cardiac catheterization was.   Laurence Aly, MD   Note: This note was generated in part or whole with voice recognition software. Voice recognition is usually quite accurate  but there are transcription errors that can and very often do occur. I apologize for any typographical errors that were not detected and corrected.     Earleen Newport, MD 04/21/18 [phone]

## 2018-04-21 NOTE — Progress Notes (Signed)
[name] presented with pleuritic chest pain, seen in office 04/06/18, after echo and CTA coronaries. CTA ccoronaries had [place] 14215.8, and all bypass grafts patent, including LIMA to LAD. SVG to OM and PDA. ECHO, 04/06/18 had LVEF 19% with severe 4 chamber dilatation, sevrer tricuspid regurgitation and severe pulmonary HTN.and patient was placed on coreg6.25 bid and  24 mg bid. Advise aggressive treatment of cardiomyopathy, with titrating medications. and ifcontinue to have chest pain will do right and left heart cath. Has been on ellequis and has to be stopped [rior to cath at least 2 days, and would have to be Monday.

## 2018-04-21 NOTE — H&P (Signed)
[place] [facility] [place] NAME: [name]    MR#:  [id number]  DATE OF BIRTH:  Dec 18, 1960  DATE OF ADMISSION:  04/21/2018  PRIMARY CARE PHYSICIAN: [name], MD   REQUESTING/REFERRING PHYSICIAN: Earleen Newport, MD  CHIEF COMPLAINT:   Chief Complaint  Patient presents with  . Chest Pain    HISTORY OF PRESENT ILLNESS: [name]  is a 57 y.o. female with a known history of chronic respiratory failure on 3 L oxygen, congestive heart failure, hyperlipidemia, hypertension previous MI, paroxysmal atrial fibrillation who is presenting to the emergency room with 3 days worth of chest pain.  She states that she is been having chest pain for the past 3 days.  Describes it as a pressure on her chest and constant.  Does not change with activity.  Patient did have a cardiac CT done as outpatient results of this are currently not available.  She also chronically has low blood pressure especially with dialysis. PAST MEDICAL HISTORY:   Past Medical History:  Diagnosis Date  . CHF (congestive heart failure) ([place])   . Heart failure ([place])   . Hyperlipidemia   . Hypertension   . Myocardial infarction ([place])   . Paroxysmal atrial fibrillation (H[facility])   . Renal failure   . Renal insufficiency   . Stroke [facility]) 2011    PAST SURGICAL HISTORY:  Past Surgical History:  Procedure Laterality Date  . CORONARY ANGIOPLASTY WITH STENT PLACEMENT  2013  . CORONARY ARTERY BYPASS GRAFT    . DIALYSIS FISTULA CREATION      SOCIAL HISTORY:  Social History   Tobacco Use  . Smoking status: Current Every Day Smoker    Packs/day: 0.10    Years: 20.00    Pack years: 2.00    Types: Cigarettes  . Smokeless tobacco: Never Used  Substance Use Topics  . Alcohol use: No    FAMILY HISTORY:  Family History  Problem Relation Age of Onset  . Hypertension Unknown   . Cancer Unknown   . Renal Disease Unknown   . Ovarian cancer Mother   . Kidney disease Father      DRUG ALLERGIES:  Allergies  Allergen Reactions  . Morphine And Related Hives  . Levaquin [Levofloxacin] Itching    Severe itching; prickly sensation  . Latex Rash  . Tape Rash    REVIEW OF SYSTEMS:   CONSTITUTIONAL: No fever, fatigue or weakness.  EYES: No blurred or double vision.  EARS, NOSE, AND THROAT: No tinnitus or ear pain.  RESPIRATORY: No cough, shortness of breath, wheezing or hemoptysis.  CARDIOVASCULAR: Positive chest pain, orthopnea, edema.  GASTROINTESTINAL: No nausea, vomiting, diarrhea or abdominal pain.  GENITOURINARY: No dysuria, hematuria.  ENDOCRINE: No polyuria, nocturia,  HEMATOLOGY: No anemia, easy bruising or bleeding SKIN: No rash or lesion. MUSCULOSKELETAL: No joint pain or arthritis.   NEUROLOGIC: No tingling, numbness, weakness.  PSYCHIATRY: No anxiety or depression.   MEDICATIONS AT HOME:  Prior to Admission medications   Medication Sig Start Date End Date Taking? Authorizing Provider  albuterol (PROVENTIL HFA;VENTOLIN HFA) 108 (90 Base) MCG/ACT inhaler Inhale 2 puffs into the lungs every 4 (four) hours as needed for wheezing or shortness of breath.   Yes [provider]  aspirin 81 MG chewable tablet Chew 81 mg by mouth daily.   Yes [provider]  carvedilol (COREG) 6.25 MG tablet Take 6.25 mg by mouth 2 (two) times daily with a meal.  Yes [provider]  cinacalcet (SENSIPAR) 60 MG tablet Take 60 mg by mouth every evening.    Yes [provider]  ELIQUIS 5 MG TABS tablet Take 1 tablet (5 mg total) by mouth 2 (two) times daily. 02/02/18  Yes Mayo, Pete Pelt, MD  hydrOXYzine (ATARAX/VISTARIL) 50 MG tablet Take 50 mg by mouth 2 (two) times daily as needed for itching.    Yes [provider]  levofloxacin (LEVAQUIN) 500 MG tablet Take 500 mg by mouth every other day. 04/18/18 04/28/18 Yes [provider]  levothyroxine (SYNTHROID, LEVOTHROID) 88 MCG tablet Take 88 mcg by mouth daily before  breakfast.  03/03/18  Yes [provider]  midodrine (PROAMATINE) 10 MG tablet Take 10-20 mg by mouth daily. (for low BP associated with dialysis)   Yes [provider]  pantoprazole (PROTONIX) 40 MG tablet Take 1 tablet by mouth daily. 05/07/13  Yes [provider]  rosuvastatin (CRESTOR) 40 MG tablet Take 40 mg by mouth daily.   Yes [provider]  sacubitril-valsartan (ENTRESTO) 24-26 MG Take 1 tablet by mouth 2 (two) times daily.   Yes [provider]  sevelamer carbonate (RENVELA) 800 MG tablet Take 4 tablets (3200MG ) by mouth 3 times daily with meals and 2 tablets (1600MG ) by mouth twice daily with snacks   Yes [provider]  cefdinir (OMNICEF) 300 MG capsule Take 1 tablet on 8/23 after dialysis Patient not taking: Reported on 04/21/2018 02/02/18   Mayo, Pete Pelt, MD  oxyCODONE (ROXICODONE) 5 MG immediate release tablet Take 1 tablet (5 mg total) by mouth every 8 (eight) hours as needed. Patient not taking: Reported on 04/21/2018 02/05/18 02/05/19  Harvest Dark, MD      PHYSICAL EXAMINATION:   VITAL SIGNS: Blood pressure 94/71, pulse 73, temperature 98 F (36.7 C), temperature source Oral, resp. rate 20, height 5\' 6"  (1.676 m), weight 78.9 kg, SpO2 99 %.  GENERAL:  57 y.o.-year-old patient lying in the bed with no acute distress.  EYES: Pupils equal, round, reactive to light and accommodation. No scleral icterus. Extraocular muscles intact.  HEENT: Head atraumatic, normocephalic. Oropharynx and nasopharynx clear.  NECK:  Supple, no jugular venous distention. No thyroid enlargement, no tenderness.  LUNGS: Normal breath sounds bilaterally, no wheezing, rales,rhonchi or crepitation. No use of accessory muscles of respiration.  CARDIOVASCULAR: S1, S2 normal. No murmurs, rubs, or gallops.  ABDOMEN: Soft, nontender, nondistended. Bowel sounds present. No organomegaly or mass.  EXTREMITIES: No pedal edema, cyanosis, or clubbing.   NEUROLOGIC: Cranial nerves II through XII are intact. Muscle strength 5/5 in all extremities. Sensation intact. Gait not checked.  PSYCHIATRIC: The patient is alert and oriented x 3.  SKIN: No obvious rash, lesion, or ulcer.   LABORATORY PANEL:   CBC Recent Labs  Lab 04/21/18 1346  WBC 3.9*  HGB 12.3  HCT 39.0  PLT 136*  MCV 94.9  MCH 29.9  MCHC 31.5  RDW 15.9*   ------------------------------------------------------------------------------------------------------------------  Chemistries  Recent Labs  Lab 04/21/18 1346  NA 141  K 3.4*  CL 103  CO2 28  GLUCOSE 92  BUN 36*  CREATININE 6.32*  CALCIUM 8.1*   ------------------------------------------------------------------------------------------------------------------ estimated creatinine clearance is 10.7 mL/min (A) (by C-G formula based on SCr of 6.32 mg/dL (H)). ------------------------------------------------------------------------------------------------------------------ No results for input(s): TSH, T4TOTAL, T3FREE, THYROIDAB in the last 72 hours.  Invalid input(s): FREET3   Coagulation profile Recent Labs  Lab 04/21/18 1346  INR 2.39   ------------------------------------------------------------------------------------------------------------------- No results for input(s):  DDIMER in the last 72 hours. -------------------------------------------------------------------------------------------------------------------  Cardiac Enzymes Recent Labs  Lab 04/21/18 1346 04/21/18 1822  TROPONINI 0.06* 0.06*   ------------------------------------------------------------------------------------------------------------------ Invalid input(s): POCBNP  ---------------------------------------------------------------------------------------------------------------  Urinalysis    Component Value Date/Time   COLORURINE Yellow 09/14/2012 0414   APPEARANCEUR Cloudy 09/14/2012 0414   LABSPEC 1.018  09/14/2012 0414   PHURINE 9.0 09/14/2012 0414   GLUCOSEU 50 mg/dL 09/14/2012 0414   HGBUR 1+ 09/14/2012 0414   BILIRUBINUR Negative 09/14/2012 0414   KETONESUR Negative 09/14/2012 0414   PROTEINUR >=500 09/14/2012 0414   NITRITE Negative 09/14/2012 0414   LEUKOCYTESUR 2+ 09/14/2012 0414     RADIOLOGY: Dg Chest 2 View  Result Date: 04/21/2018 CLINICAL DATA:  Chest pain. EXAM: CHEST - 2 VIEW COMPARISON:  Radiographs of February 25, 2018. FINDINGS: Stable cardiomegaly with central pulmonary vascular congestion. Status post coronary artery bypass graft. No pneumothorax or pleural effusion is noted. No consolidative process is noted. Bony thorax is unremarkable. IMPRESSION: Stable cardiomegaly with central pulmonary vascular congestion. Electronically Signed   By: Marijo Conception, M.D.   On: 04/21/2018 14:31    EKG: Orders placed or performed during the hospital encounter of 04/21/18  . EKG 12-Lead  . EKG 12-Lead  . ED EKG within 10 minutes  . ED EKG within 10 minutes    IMPRESSION AND PLAN: Patient is a 57 year old presenting with chest pain  1.  Chest pain we will cycle cardiac enzymes Cardiology has been notified Treat with aspirin PRN nitroglycerin Morphine as needed for severe pain  2.  End-stage renal disease nephrology has been consulted  3.  Mild hypokalemia in a patient with end-stage renal disease we will not replace her potassium   4.  Chronically low blood pressure continue midodrine  5.  History of systolic CHF continue therapy with Entresto and Coreg Currently compensated  6.  History of atrial fibrillation continue Eliquis   All the records are reviewed and case discussed with ED provider. Management plans discussed with the patient, family and they are in agreement.  CODE STATUS:    Code Status Orders  (From admission, onward)         Start     Ordered   04/21/18 1809  Full code  Continuous     04/21/18 1808        Code Status History     Date Active Date Inactive Code Status Order ID Comments User Context   01/31/2018 1924 02/03/2018 0111 Full Code [id number]  Loletha Grayer, MD ED   11/13/2014 1659 11/15/2014 2152 Full Code [id number]  Hillary Bow, MD ED   10/16/2014 0823 10/17/2014 2333 Full Code [id number]  Lance Coon, MD Inpatient       TOTAL TIME TAKING CARE OF THIS PATIENT: 55 minutes.    Dustin Flock M.D on 04/21/2018 at 10:10 PM  Between 7am to 6pm - Pager - [phone]  After 6pm go to [email or web address] - password [email or web address] [facility]  [phone]  CC: Primary care physician; [name], MD

## 2018-04-22 DIAGNOSIS — R079 Chest pain, unspecified: Secondary | ICD-10-CM | POA: Diagnosis not present

## 2018-04-22 LAB — TROPONIN I
TROPONIN I: 0.06 ng/mL — AB (ref <0.03)
Troponin I: 0.06 ng/mL (ref <0.03)

## 2018-04-22 LAB — PHOSPHORUS: PHOSPHORUS: 2.2 mg/dL — AB (ref 2.5–4.6)

## 2018-04-22 MED ORDER — SODIUM CHLORIDE 0.9 % IV SOLN: 100.0000 mL | INTRAVENOUS | Status: DC | PRN

## 2018-04-22 MED ORDER — LIDOCAINE HCL (PF) 1 % IJ SOLN
5.0000 mL | INTRAMUSCULAR | Status: DC | PRN
  Filled 2018-04-22: qty 5

## 2018-04-22 MED ORDER — PENTAFLUOROPROP-TETRAFLUOROETH EX AERO
1.0000 "application " | INHALATION_SPRAY | CUTANEOUS | Status: DC | PRN
  Filled 2018-04-22: qty 30

## 2018-04-22 MED ORDER — HEPARIN SODIUM (PORCINE) 1000 UNIT/ML DIALYSIS
1000.0000 [IU] | INTRAMUSCULAR | Status: DC | PRN
  Filled 2018-04-22: qty 1

## 2018-04-22 MED ORDER — CHLORHEXIDINE GLUCONATE CLOTH 2 % EX PADS: 6.0000 | MEDICATED_PAD | Freq: Every day | CUTANEOUS | Status: DC

## 2018-04-22 MED ORDER — LIDOCAINE-PRILOCAINE 2.5-2.5 % EX CREA
1.0000 "application " | TOPICAL_CREAM | CUTANEOUS | Status: DC | PRN
  Filled 2018-04-22 (×2): qty 5

## 2018-04-22 MED ORDER — HEPARIN SODIUM (PORCINE) 1000 UNIT/ML DIALYSIS
20.0000 [IU]/kg | INTRAMUSCULAR | Status: DC | PRN
  Filled 2018-04-22: qty 2

## 2018-04-22 NOTE — Progress Notes (Signed)
Patient discharged home as ordered,escorted by staff member via wheel chair

## 2018-04-22 NOTE — Progress Notes (Signed)
This note also relates to the following rows which could not be included: Resp - Cannot attach notes to unvalidated device data  Hd started

## 2018-04-22 NOTE — Consult Note (Addendum)
[name] is a 57 y.o. female  [id number]  Primary Cardiologist: Dr. Neoma Laming Reason for Consultation: Chest pain and cardiomyopathy  HPI: 57 yo female with a past medical history of severe cardiomyopathy LVEF=19% on 04/06/18, CAD with CABG, severe pulmonary HTN, renal failure on dialysis, and paroxysmal AF presented to ER with shortness of breath, nausea, and diaphoresis. She is a new patient to [place] in the last 2 months. She has been wearing a [employer]oll LifeVest due to her severe LV dysfunction while we up-titrate her medications for her severe LV dysfunction.   Review of Systems: Chest pain with inspiration and palpation of chest wall, remains mildly short of breath on 3L oxygen. Due for dialysis today.    Past Medical History:  Diagnosis Date  . CHF (congestive heart failure) ([place])   . Heart failure ([place])   . Hyperlipidemia   . Hypertension   . Myocardial infarction ([place])   . Paroxysmal atrial fibrillation ([facility])   . Renal failure   . Renal insufficiency   . Stroke [facility]) 2011    Medications Prior to Admission  Medication Sig Dispense Refill  . albuterol (PROVENTIL HFA;VENTOLIN HFA) 108 (90 Base) MCG/ACT inhaler Inhale 2 puffs into the lungs every 4 (four) hours as needed for wheezing or shortness of breath.    Marland Kitchen aspirin 81 MG chewable tablet Chew 81 mg by mouth daily.    . carvedilol (COREG) 6.25 MG tablet Take 6.25 mg by mouth 2 (two) times daily with a meal.    . cinacalcet (SENSIPAR) 60 MG tablet Take 60 mg by mouth every evening.     Marland Kitchen ELIQUIS 5 MG TABS tablet Take 1 tablet (5 mg total) by mouth 2 (two) times daily. 60 tablet 0  . hydrOXYzine (ATARAX/VISTARIL) 50 MG tablet Take 50 mg by mouth 2 (two) times daily as needed for itching.     Marland Kitchen levofloxacin (LEVAQUIN) 500 MG tablet Take 500 mg by mouth every other day.    . levothyroxine (SYNTHROID, LEVOTHROID) 88 MCG tablet Take 88 mcg by mouth daily before breakfast.     . midodrine (PROAMATINE) 10 MG tablet Take  10-20 mg by mouth daily. (for low BP associated with dialysis)    . pantoprazole (PROTONIX) 40 MG tablet Take 1 tablet by mouth daily.    . rosuvastatin (CRESTOR) 40 MG tablet Take 40 mg by mouth daily.    . sacubitril-valsartan (ENTRESTO) 24-26 MG Take 1 tablet by mouth 2 (two) times daily.    . sevelamer carbonate (RENVELA) 800 MG tablet Take 4 tablets (3200MG ) by mouth 3 times daily with meals and 2 tablets (1600MG ) by mouth twice daily with snacks    . cefdinir (OMNICEF) 300 MG capsule Take 1 tablet on 8/23 after dialysis (Patient not taking: Reported on 04/21/2018) 1 capsule 0  . oxyCODONE (ROXICODONE) 5 MG immediate release tablet Take 1 tablet (5 mg total) by mouth every 8 (eight) hours as needed. (Patient not taking: Reported on 04/21/2018) 8 tablet 0     . apixaban  5 mg Oral BID  . aspirin  81 mg Oral Daily  . carvedilol  6.25 mg Oral BID WC  . Chlorhexidine Gluconate Cloth  6 each Topical Q0600  . cinacalcet  60 mg Oral Q supper  . Influenza vac split quadrivalent PF  0.5 mL Intramuscular Tomorrow-1000  . levothyroxine  88 mcg Oral Q0600  . midodrine  10 mg Oral Daily  . pantoprazole  40 mg Oral Daily  .  pneumococcal 23 valent vaccine  0.5 mL Intramuscular Tomorrow-1000  . rosuvastatin  40 mg Oral Daily  . sacubitril-valsartan  1 tablet Oral BID  . sevelamer carbonate  1,600 mg Oral BID  . sevelamer carbonate  3,200 mg Oral TID WC  . sodium chloride flush  3 mL Intravenous Q12H    Infusions: . sodium chloride      Allergies  Allergen Reactions  . Morphine And Related Hives  . Levaquin [Levofloxacin] Itching    Severe itching; prickly sensation  . Latex Rash  . Tape Rash    Social History   Socioeconomic History  . Marital status: Single    Spouse name: Not on file  . Number of children: Not on file  . Years of education: Not on file  . Highest education level: Not on file  Occupational History  . Not on file  Social Needs  . Financial resource strain: Not  on file  . Food insecurity:    Worry: Not on file    Inability: Not on file  . Transportation needs:    Medical: Not on file    Non-medical: Not on file  Tobacco Use  . Smoking status: Current Every Day Smoker    Packs/day: 0.10    Years: 20.00    Pack years: 2.00    Types: Cigarettes  . Smokeless tobacco: Never Used  Substance and Sexual Activity  . Alcohol use: No  . Drug use: No  . Sexual activity: Not Currently  Lifestyle  . Physical activity:    Days per week: Not on file    Minutes per session: Not on file  . Stress: Not on file  Relationships  . Social connections:    Talks on phone: Not on file    Gets together: Not on file    Attends religious service: Not on file    Active member of club or organization: Not on file    Attends meetings of clubs or organizations: Not on file    Relationship status: Not on file  . Intimate partner violence:    Fear of current or ex partner: Not on file    Emotionally abused: Not on file    Physically abused: Not on file    Forced sexual activity: Not on file  Other Topics Concern  . Not on file  Social History Narrative  . Not on file    Family History  Problem Relation Age of Onset  . Hypertension Unknown   . Cancer Unknown   . Renal Disease Unknown   . Ovarian cancer Mother   . Kidney disease Father     PHYSICAL EXAM: Vitals:   04/21/18 2057 04/22/18 0746  BP: 94/71 106/69  Pulse: 73 69  Resp: 20   Temp: 98 F (36.7 C) 97.8 F (36.6 C)  SpO2: 99% 100%    No intake or output data in the 24 hours ending 04/22/18 5784  General:  Facial features appear mildly swollen compared to last office visit. HEENT: normal Neck: supple. no JVD. Carotids 2+ bilat; no bruits. No lymphadenopathy or thryomegaly appreciated. Cor: PMI nondisplaced. Regular rate & rhythm. No rubs, gallops or murmurs.Left upper chest wall extremely tender to palpation. Lungs: clear, painful inspiration reported. Abdomen: soft, nontender,  nondistended. No hepatosplenomegaly. No bruits or masses. Good bowel sounds. Extremities: no cyanosis, clubbing, rash, edema Neuro: alert & oriented x 3, cranial nerves grossly intact. moves all 4 extremities w/o difficulty. Affect pleasant.  ECG: Sinus rhythm 78bpm with  Fusion complexes Right bundle branch block Inferior infarct , age undetermined Anterior infarct , age undetermined   Results for orders placed or performed during the hospital encounter of 04/21/18 (from the past 24 hour(s))  Basic metabolic panel     Status: Abnormal   Collection Time: 04/21/18  1:46 PM  Result Value Ref Range   Sodium 141 135 - 145 mmol/L   Potassium 3.4 (L) 3.5 - 5.1 mmol/L   Chloride 103 98 - 111 mmol/L   CO2 28 22 - 32 mmol/L   Glucose, Bld 92 70 - 99 mg/dL   BUN 36 (H) 6 - 20 mg/dL   Creatinine, Ser 6.32 (H) 0.44 - 1.00 mg/dL   Calcium 8.1 (L) 8.9 - 10.3 mg/dL   GFR calc non Af Amer 7 (L) >60 mL/min   GFR calc Af Amer 8 (L) >60 mL/min   Anion gap 10 5 - 15  CBC     Status: Abnormal   Collection Time: 04/21/18  1:46 PM  Result Value Ref Range   WBC 3.9 (L) 4.0 - 10.5 K/uL   RBC 4.11 3.87 - 5.11 MIL/uL   Hemoglobin 12.3 12.0 - 15.0 g/dL   HCT 39.0 36.0 - 46.0 %   MCV 94.9 80.0 - 100.0 fL   MCH 29.9 26.0 - 34.0 pg   MCHC 31.5 30.0 - 36.0 g/dL   RDW 15.9 (H) 11.5 - 15.5 %   Platelets 136 (L) 150 - 400 K/uL   nRBC 0.0 0.0 - 0.2 %  Troponin I     Status: Abnormal   Collection Time: 04/21/18  1:46 PM  Result Value Ref Range   Troponin I 0.06 (HH) <0.03 ng/mL  Protime-INR (order if Patient is taking Coumadin / Warfarin)     Status: Abnormal   Collection Time: 04/21/18  1:46 PM  Result Value Ref Range   Prothrombin Time 25.7 (H) 11.4 - 15.2 seconds   INR 2.39   Troponin I     Status: Abnormal   Collection Time: 04/21/18  6:22 PM  Result Value Ref Range   Troponin I 0.06 (HH) <0.03 ng/mL  Troponin I     Status: Abnormal   Collection Time: 04/22/18 12:18 AM  Result Value Ref Range    Troponin I 0.06 (HH) <0.03 ng/mL  Troponin I     Status: Abnormal   Collection Time: 04/22/18  6:01 AM  Result Value Ref Range   Troponin I 0.06 (HH) <0.03 ng/mL   Dg Chest 2 View  Result Date: 04/21/2018 CLINICAL DATA:  Chest pain. EXAM: CHEST - 2 VIEW COMPARISON:  Radiographs of February 25, 2018. FINDINGS: Stable cardiomegaly with central pulmonary vascular congestion. Status post coronary artery bypass graft. No pneumothorax or pleural effusion is noted. No consolidative process is noted. Bony thorax is unremarkable. IMPRESSION: Stable cardiomegaly with central pulmonary vascular congestion. Electronically Signed   By: Marijo Conception, M.D.   On: 04/21/2018 14:31     ASSESSMENT AND PLAN:  Chest pain: Last echo and CCTA done in our office 04/06/18 shows LVEF 19%, and CCTA showed extremely elevated Ca score 14,215.8 but all bypass grafts patent, including LIMA to LAD. SVG to OM and PDA. Troponin mildly elevated but stable for pts baseline. Chest wall is tender to palpation and chest pain increases with inspiration. Chest pain is likely musculoskeletal in nature.   Congestive heart failure with reduced EF: Blood pressures are  low-normal and pt goes for dialysis today so we cannot up-titrate carvedilol  and Entresto at this time. Will attempt to increase carvediolol if blood pressure allows in outpatient setting.   Shortness of breath: Has severe pulmonary HTN, acute on chronic CHF, and possible component of under-dialyzed ESRD. Will defer to nephrology and continue to monitor.   May follow up outpatient with Dr. Humphrey Rolls Monday 04/25/18 at 1pm for continued medication management. May go home on percoset  todaywith f/u Monday 1 pm    Jake Bathe, NP-C Cell: [phone]

## 2018-04-22 NOTE — Care Management Note (Addendum)
Case Management Note  Patient Details  Name: [name] MRN: [id number] Date of Birth: 1960-09-09  Subjective/Objective:    RNCM to bedside to assess; patient is currently in HD.  Estill Bamberg was made aware this morning of patient admission.  HD on MWF at [facility], [place].  From home with life vest.  Chronic eliquis & entresto.  Plan to discharge after HD.  Independent in all adls, denies issues accessing medical care, obtaining medications or with transportation.  Current with PCP.  No discharge needs identified at present by care manager or members of care team   Action/Plan:   Expected Discharge Date:                  Expected Discharge Plan:     In-House Referral:     Discharge planning Services     Post Acute Care Choice:    Choice offered to:     DME Arranged:    DME Agency:     HH Arranged:    [place] Agency:     Status of Service:  In process, will continue to follow  If discussed at Long Length of Stay Meetings, dates discussed:    Additional Comments:  Elza Rafter, RN 04/22/2018, 3:26 PM

## 2018-04-22 NOTE — Progress Notes (Signed)
This note also relates to the following rows which could not be included: Resp - Cannot attach notes to unvalidated device data  Hd completed

## 2018-04-22 NOTE — Care Management Obs Status (Signed)
[facility] NOTIFICATION   Patient Details  Name: [name] MRN: [id number] Date of Birth: 10-25-1960   Medicare Observation Status Notification Given:  Yes    Elza Rafter, RN 04/22/2018, 3:44 PM

## 2018-04-22 NOTE — Discharge Summary (Signed)
[place] [facility] [place] NAME: [name]    MR#:  [id number]  DATE OF BIRTH:  May 11, 1961  DATE OF ADMISSION:  04/21/2018 ADMITTING PHYSICIAN: Dustin Flock, MD  DATE OF DISCHARGE: 04/22/2018  PRIMARY CARE PHYSICIAN: [name], MD    ADMISSION DIAGNOSIS:  Nonspecific chest pain [R07.9]  DISCHARGE DIAGNOSIS:  chest pain appears atypical  SECONDARY DIAGNOSIS:   Past Medical History:  Diagnosis Date  . CHF (congestive heart failure) ([place])   . Heart failure ([place])   . Hyperlipidemia   . Hypertension   . Myocardial infarction ([place])   . Paroxysmal atrial fibrillation ([facility])   . Renal failure   . Renal insufficiency   . Stroke [facility]) 2011    HOSPITAL COURSE:  [name]  is a 57 y.o. female with a known history of chronic respiratory failure on 3 L oxygen, congestive heart failure, hyperlipidemia, hypertension previous MI, paroxysmal atrial fibrillation who is presenting to the emergency room with 3 days worth of chest pain.  She states that she is been having chest pain for the past 3 days.    1.  Chest pain appears atypical -cardiac enzymes x4 havebeen flat at 0.06 -Cardiology dr Humphrey Rolls has seen the patient as outpatient. Outpatient CT coronary shows high heavy calcium burden how were there patent bypass graft. No plans for heart catheterization by Dr. Yancey Flemings.  -Continue current meds and follow-up as outpatient on Monday 11/11/ 2019  -continue aspirin ,PRN nitroglycerin  2.  End-stage renal disease nephrology has been consulted patient will get hemodialysis today  3.  Mild hypokalemia in a patient with end-stage renal disease we will not replace her potassium  4.  Chronically low blood pressure continue midodrine  5.  History of systolic CHF with the EF of 20% on recent echo done as outpatient  continue therapy with Entresto and Coreg Currently compensated patient uses chronic home oxygen  6.  History of atrial  fibrillation continue Eliquis  she will discharge after dialysis. Overall remains stable CONSULTS OBTAINED:  Treatment Team:  Dionisio David, MD Murlean Iba, MD  DRUG ALLERGIES:   Allergies  Allergen Reactions  . Morphine And Related Hives  . Levaquin [Levofloxacin] Itching    Severe itching; prickly sensation  . Latex Rash  . Tape Rash    DISCHARGE MEDICATIONS:   Allergies as of 04/22/2018      Reactions   Morphine And Related Hives   Levaquin [levofloxacin] Itching   Severe itching; prickly sensation   Latex Rash   Tape Rash      Medication List    STOP taking these medications   cefdinir 300 MG capsule Commonly known as:  OMNICEF   levofloxacin 500 MG tablet Commonly known as:  LEVAQUIN   oxyCODONE 5 MG immediate release tablet Commonly known as:  Oxy IR/ROXICODONE     TAKE these medications   albuterol 108 (90 Base) MCG/ACT inhaler Commonly known as:  PROVENTIL HFA;VENTOLIN HFA Inhale 2 puffs into the lungs every 4 (four) hours as needed for wheezing or shortness of breath.   aspirin 81 MG chewable tablet Chew 81 mg by mouth daily.   carvedilol 6.25 MG tablet Commonly known as:  COREG Take 6.25 mg by mouth 2 (two) times daily with a meal.   ELIQUIS 5 MG Tabs tablet Generic drug:  apixaban Take 1 tablet (5 mg total) by mouth 2 (two) times daily.   ENTRESTO 24-26 MG Generic drug:  sacubitril-valsartan  Take 1 tablet by mouth 2 (two) times daily.   hydrOXYzine 50 MG tablet Commonly known as:  ATARAX/VISTARIL Take 50 mg by mouth 2 (two) times daily as needed for itching.   levothyroxine 88 MCG tablet Commonly known as:  SYNTHROID, LEVOTHROID Take 88 mcg by mouth daily before breakfast.   midodrine 10 MG tablet Commonly known as:  PROAMATINE Take 10-20 mg by mouth daily. (for low BP associated with dialysis)   pantoprazole 40 MG tablet Commonly known as:  PROTONIX Take 1 tablet by mouth daily.   rosuvastatin 40 MG tablet Commonly known  as:  CRESTOR Take 40 mg by mouth daily.   SENSIPAR 60 MG tablet Generic drug:  cinacalcet Take 60 mg by mouth every evening.   sevelamer carbonate 800 MG tablet Commonly known as:  RENVELA Take 4 tablets (3200MG ) by mouth 3 times daily with meals and 2 tablets (1600MG ) by mouth twice daily with snacks       If you experience worsening of your admission symptoms, develop shortness of breath, life threatening emergency, suicidal or homicidal thoughts you must seek medical attention immediately by calling 911 or calling your MD immediately  if symptoms less severe.  You Must read complete instructions/literature along with all the possible adverse reactions/side effects for all the Medicines you take and that have been prescribed to you. Take any new Medicines after you have completely understood and accept all the possible adverse reactions/side effects.   Please note  You were cared for by a hospitalist during your hospital stay. If you have any questions about your discharge medications or the care you received while you were in the hospital after you are discharged, you can call the unit and asked to speak with the hospitalist on call if the hospitalist that took care of you is not available. Once you are discharged, your primary care physician will handle any further medical issues. Please note that NO REFILLS for any discharge medications will be authorized once you are discharged, as it is imperative that you return to your primary care physician (or establish a relationship with a primary care physician if you do not have one) for your aftercare needs so that they can reassess your need for medications and monitor your lab values. Today   SUBJECTIVE   denies any chest pain at present. Eating lunch. Denies any acid reflux  VITAL SIGNS:  Blood pressure 106/69, pulse 69, temperature 97.8 F (36.6 C), temperature source Oral, resp. rate 20, height 5\' 6"  (1.676 m), weight 78.9 kg, SpO2  100 %.  I/O:    Intake/Output Summary (Last 24 hours) at 04/22/2018 1346 Last data filed at 04/22/2018 1005 Gross per 24 hour  Intake 240 ml  Output -  Net 240 ml    PHYSICAL EXAMINATION:  GENERAL:  57 y.o.-year-old patient lying in the bed with no acute distress.  EYES: Pupils equal, round, reactive to light and accommodation. No scleral icterus. Extraocular muscles intact.  HEENT: Head atraumatic, normocephalic. Oropharynx and nasopharynx clear.  NECK:  Supple, no jugular venous distention. No thyroid enlargement, no tenderness.  LUNGS: Normal breath sounds bilaterally, no wheezing, rales,rhonchi or crepitation. No use of accessory muscles of respiration.  CARDIOVASCULAR: S1, S2 normal. No murmurs, rubs, or gallops.  ABDOMEN: Soft, non-tender, non-distended. Bowel sounds present. No organomegaly or mass.  EXTREMITIES: No pedal edema, cyanosis, or clubbing.  NEUROLOGIC: Cranial nerves II through XII are intact. Muscle strength 5/5 in all extremities. Sensation intact. Gait not checked.  PSYCHIATRIC:  The patient is alert and oriented x 3.  SKIN: No obvious rash, lesion, or ulcer.   DATA REVIEW:   CBC  Recent Labs  Lab 04/21/18 1346  WBC 3.9*  HGB 12.3  HCT 39.0  PLT 136*    Chemistries  Recent Labs  Lab 04/21/18 1346  NA 141  K 3.4*  CL 103  CO2 28  GLUCOSE 92  BUN 36*  CREATININE 6.32*  CALCIUM 8.1*    Microbiology Results   No results found for this or any previous visit (from the past 240 hour(s)).  RADIOLOGY:  Dg Chest 2 View  Result Date: 04/21/2018 CLINICAL DATA:  Chest pain. EXAM: CHEST - 2 VIEW COMPARISON:  Radiographs of February 25, 2018. FINDINGS: Stable cardiomegaly with central pulmonary vascular congestion. Status post coronary artery bypass graft. No pneumothorax or pleural effusion is noted. No consolidative process is noted. Bony thorax is unremarkable. IMPRESSION: Stable cardiomegaly with central pulmonary vascular congestion. Electronically  Signed   By: Marijo Conception, M.D.   On: 04/21/2018 14:31     Management plans discussed with the patient, family and they are in agreement.  CODE STATUS:     Code Status Orders  (From admission, onward)         Start     Ordered   04/21/18 1809  Full code  Continuous     04/21/18 1808        Code Status History    Date Active Date Inactive Code Status Order ID Comments User Context   01/31/2018 1924 02/03/2018 0111 Full Code [id number]  Loletha Grayer, MD ED   11/13/2014 1659 11/15/2014 2152 Full Code [id number]  Hillary Bow, MD ED   10/16/2014 0823 10/17/2014 2333 Full Code [id number]  Lance Coon, MD Inpatient      TOTAL TIME TAKING CARE OF THIS PATIENT: *40* minutes.    Fritzi Mandes M.D on 04/22/2018 at 1:46 PM  Between 7am to 6pm - Pager - [phone] After 6pm go to [email or web address] - password [email or web address] [facility]  Office  [phone]  CC: Primary care physician; [name], MD

## 2018-04-22 NOTE — Discharge Instructions (Signed)
Resume your outpatient hemodialysis as before.

## 2018-04-22 NOTE — Progress Notes (Signed)
[facility], [place] 04/22/18  Subjective:   Patient known to our practice from outpatient dialysis.  She has severe cardiomyopathy and coronary disease.  She presents for chest pain, palpitations No leg edema Troponin mildly elevated  Objective:  Vital signs in last 24 hours:  Temp:  [97.8 F (36.6 C)-98.8 F (37.1 C)] 97.8 F (36.6 C) (11/08 0746) Pulse Rate:  [69-88] 69 (11/08 0746) Resp:  [14-20] 20 (11/07 2057) BP: (94-117)/(61-87) 106/69 (11/08 0746) SpO2:  [93 %-100 %] 100 % (11/08 0746) Weight:  [78.9 kg] 78.9 kg (11/07 1448)  Weight change:  Filed Weights   04/21/18 1341 04/21/18 1448  Weight: 78.9 kg 78.9 kg    Intake/Output:    Intake/Output Summary (Last 24 hours) at 04/22/2018 1134 Last data filed at 04/22/2018 1005 Gross per 24 hour  Intake 240 ml  Output -  Net 240 ml     Physical Exam: General:  No acute distress, sitting up in the bed  HEENT  anicteric, moist oral mucous membranes  Neck  supple, no masses  Pulm/lungs  decreased breath sounds, supplemental oxygen  CVS/Heart  no rub  Abdomen:   Soft, nontender  Extremities:  No edema  Neurologic:  Alert, oriented  Skin:  No acute rashes  Access:  Left upper arm AV fistula       Basic Metabolic Panel:  Recent Labs  Lab 04/21/18 1346  NA 141  K 3.4*  CL 103  CO2 28  GLUCOSE 92  BUN 36*  CREATININE 6.32*  CALCIUM 8.1*     CBC: Recent Labs  Lab 04/21/18 1346  WBC 3.9*  HGB 12.3  HCT 39.0  MCV 94.9  PLT 136*      Lab Results  Component Value Date   HEPBSAG Negative 07/23/2017      Microbiology:  No results found for this or any previous visit (from the past 240 hour(s)).  Coagulation Studies: Recent Labs    04/21/18 1346  LABPROT 25.7*  INR 2.39    Urinalysis: No results for input(s): COLORURINE, LABSPEC, PHURINE, GLUCOSEU, HGBUR, BILIRUBINUR, KETONESUR, PROTEINUR, UROBILINOGEN, NITRITE, LEUKOCYTESUR in the last 72  hours.  Invalid input(s): APPERANCEUR    Imaging: Dg Chest 2 View  Result Date: 04/21/2018 CLINICAL DATA:  Chest pain. EXAM: CHEST - 2 VIEW COMPARISON:  Radiographs of February 25, 2018. FINDINGS: Stable cardiomegaly with central pulmonary vascular congestion. Status post coronary artery bypass graft. No pneumothorax or pleural effusion is noted. No consolidative process is noted. Bony thorax is unremarkable. IMPRESSION: Stable cardiomegaly with central pulmonary vascular congestion. Electronically Signed   By: Marijo Conception, M.D.   On: 04/21/2018 14:31     Medications:   . sodium chloride    . sodium chloride    . sodium chloride     . apixaban  5 mg Oral BID  . aspirin  81 mg Oral Daily  . carvedilol  6.25 mg Oral BID WC  . Chlorhexidine Gluconate Cloth  6 each Topical Q0600  . cinacalcet  60 mg Oral Q supper  . Influenza vac split quadrivalent PF  0.5 mL Intramuscular Tomorrow-1000  . levothyroxine  88 mcg Oral Q0600  . midodrine  10 mg Oral Daily  . pantoprazole  40 mg Oral Daily  . pneumococcal 23 valent vaccine  0.5 mL Intramuscular Tomorrow-1000  . rosuvastatin  40 mg Oral Daily  . sacubitril-valsartan  1 tablet Oral BID  . sevelamer carbonate  1,600 mg Oral BID  .  sevelamer carbonate  3,200 mg Oral TID WC  . sodium chloride flush  3 mL Intravenous Q12H   sodium chloride, sodium chloride, sodium chloride, acetaminophen **OR** acetaminophen, albuterol, heparin, heparin, hydrOXYzine, lidocaine (PF), lidocaine-prilocaine, nitroGLYCERIN, ondansetron **OR** ondansetron (ZOFRAN) IV, oxyCODONE, pentafluoroprop-tetrafluoroeth, sodium chloride flush  Assessment/ Plan:  57 y.o. African-American female ESRD on HD MWF, hypertension, congestive heart failure, anemia of CKD, SHPTH, myocardial infarction, hx of CVA, prior episodes of hyperkalemia, pulmonary embolism 01/2018,   [place] /MWF/EDW 79 kg  #Chest pain-cardiac cause versus pleuritic chest pain.   Investigations in progress #ESRD on hemodialysis.  Routine hemodialysis today.  Patient is close to her dry weight therefore minimal ultrafiltration will be performed. #Anemia of chronic kidney disease.  Current hemoglobin 12.3.  Hold Epogen #Secondary hyperparathyroidism.  Monitor phosphorus during admission #Severe pulmonary hypertension-makes fluid management challenging #History of pulmonary embolism August 2019.  Continued on Eliquis # Severe systolic congestive heart failure and grade 3 diastolic CHF -continue to try to maintain euvolemia as much as possible     LOS: 0 [name] 11/8/201911:34 AM  [place], [place]  Note: This note was prepared with Dragon dictation. Any transcription errors are unintentional

## 2018-05-26 IMAGING — CR DG CHEST 2V
1 series · 2 of 2 positions shown · non-contrast
Comparison: 04/12/2016

CLINICAL DATA: Pt states she has left side chest pain with
difficulty breathing for 1.5 months. No injury. Pt had triple bypass
surgery in 4314, heart failure, renal failure and stroke.

EXAM:
CHEST  2 VIEW

[Series 1: dg chest 2 view · 0.14mm/px · 2 of 2 slices shown]
[im 1/2]
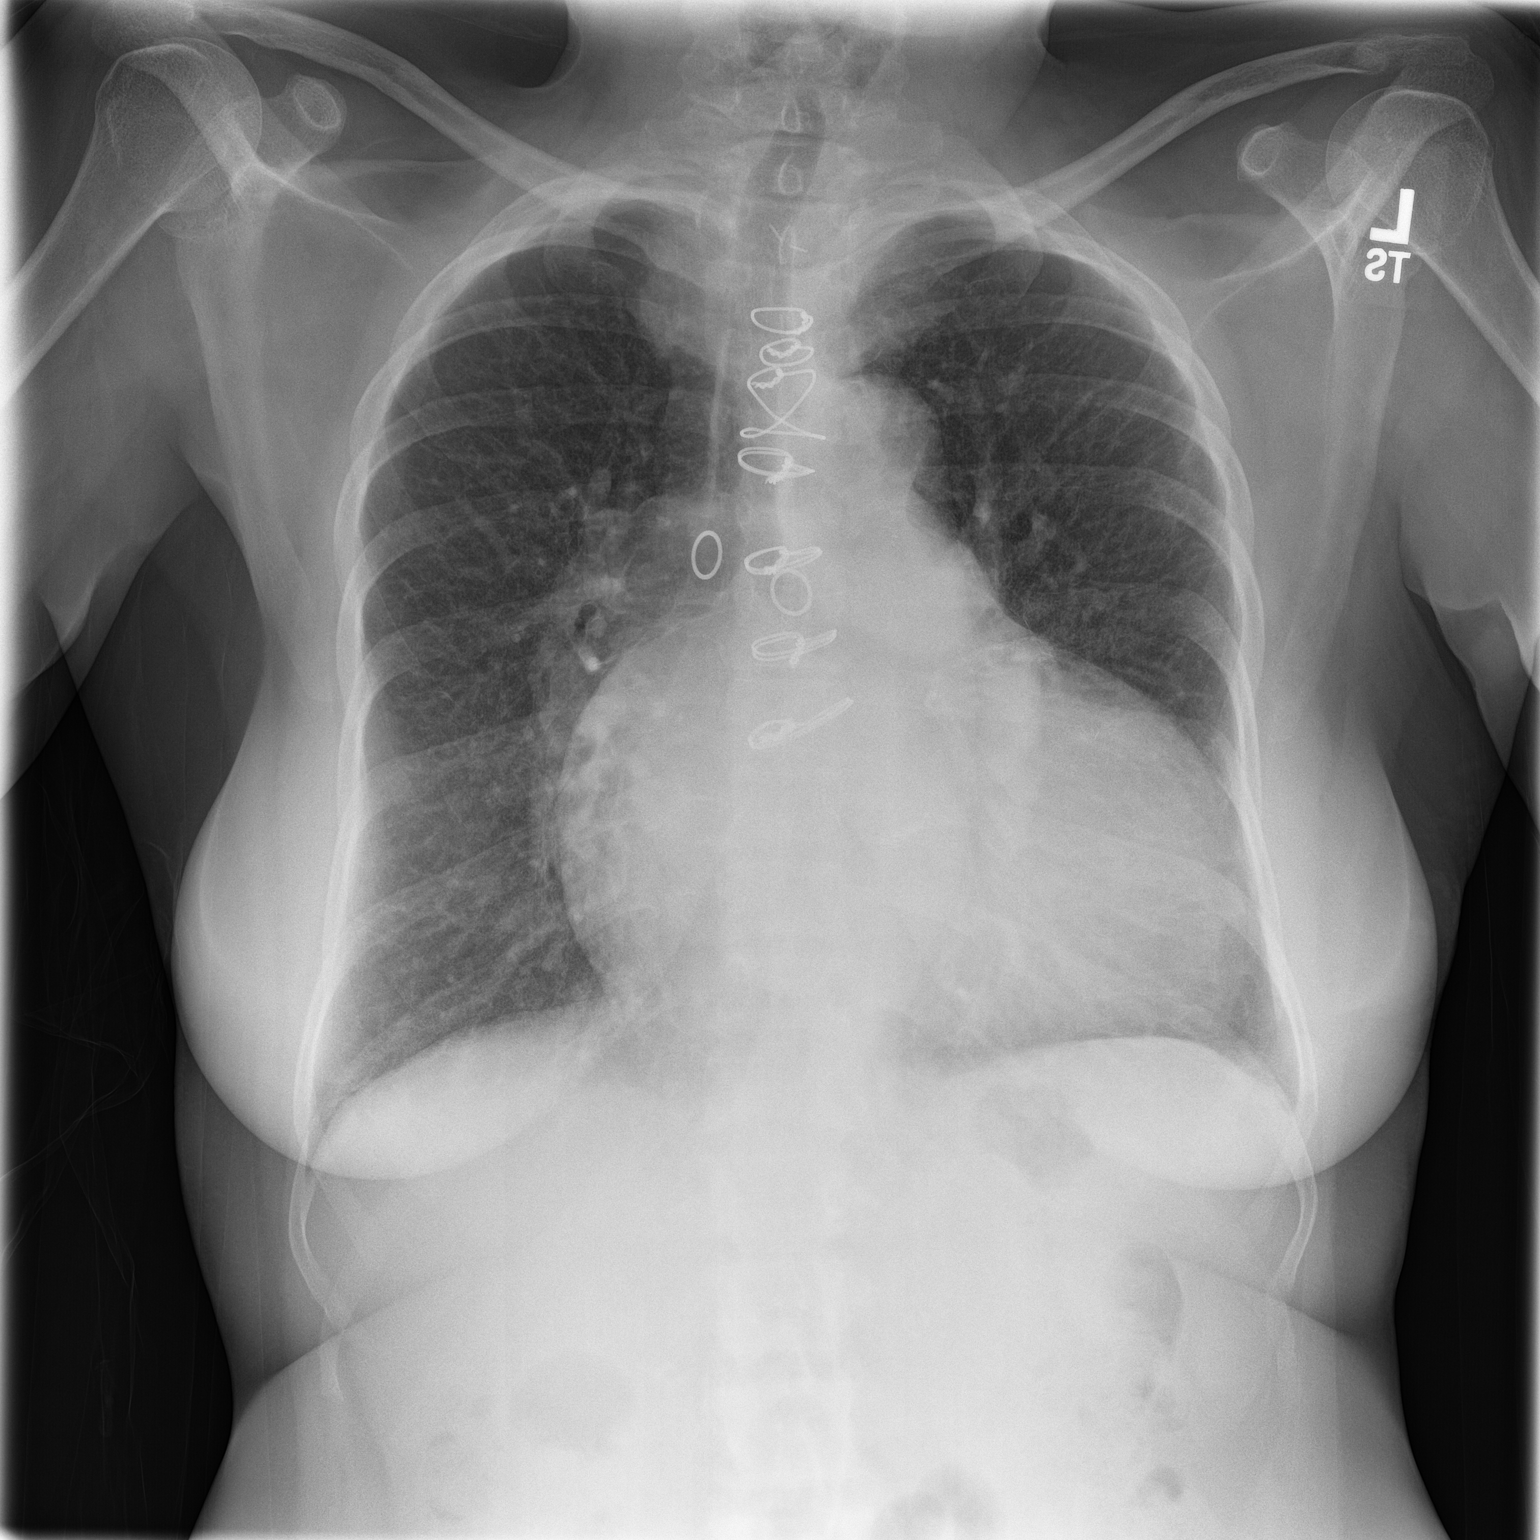
[im 2/2]
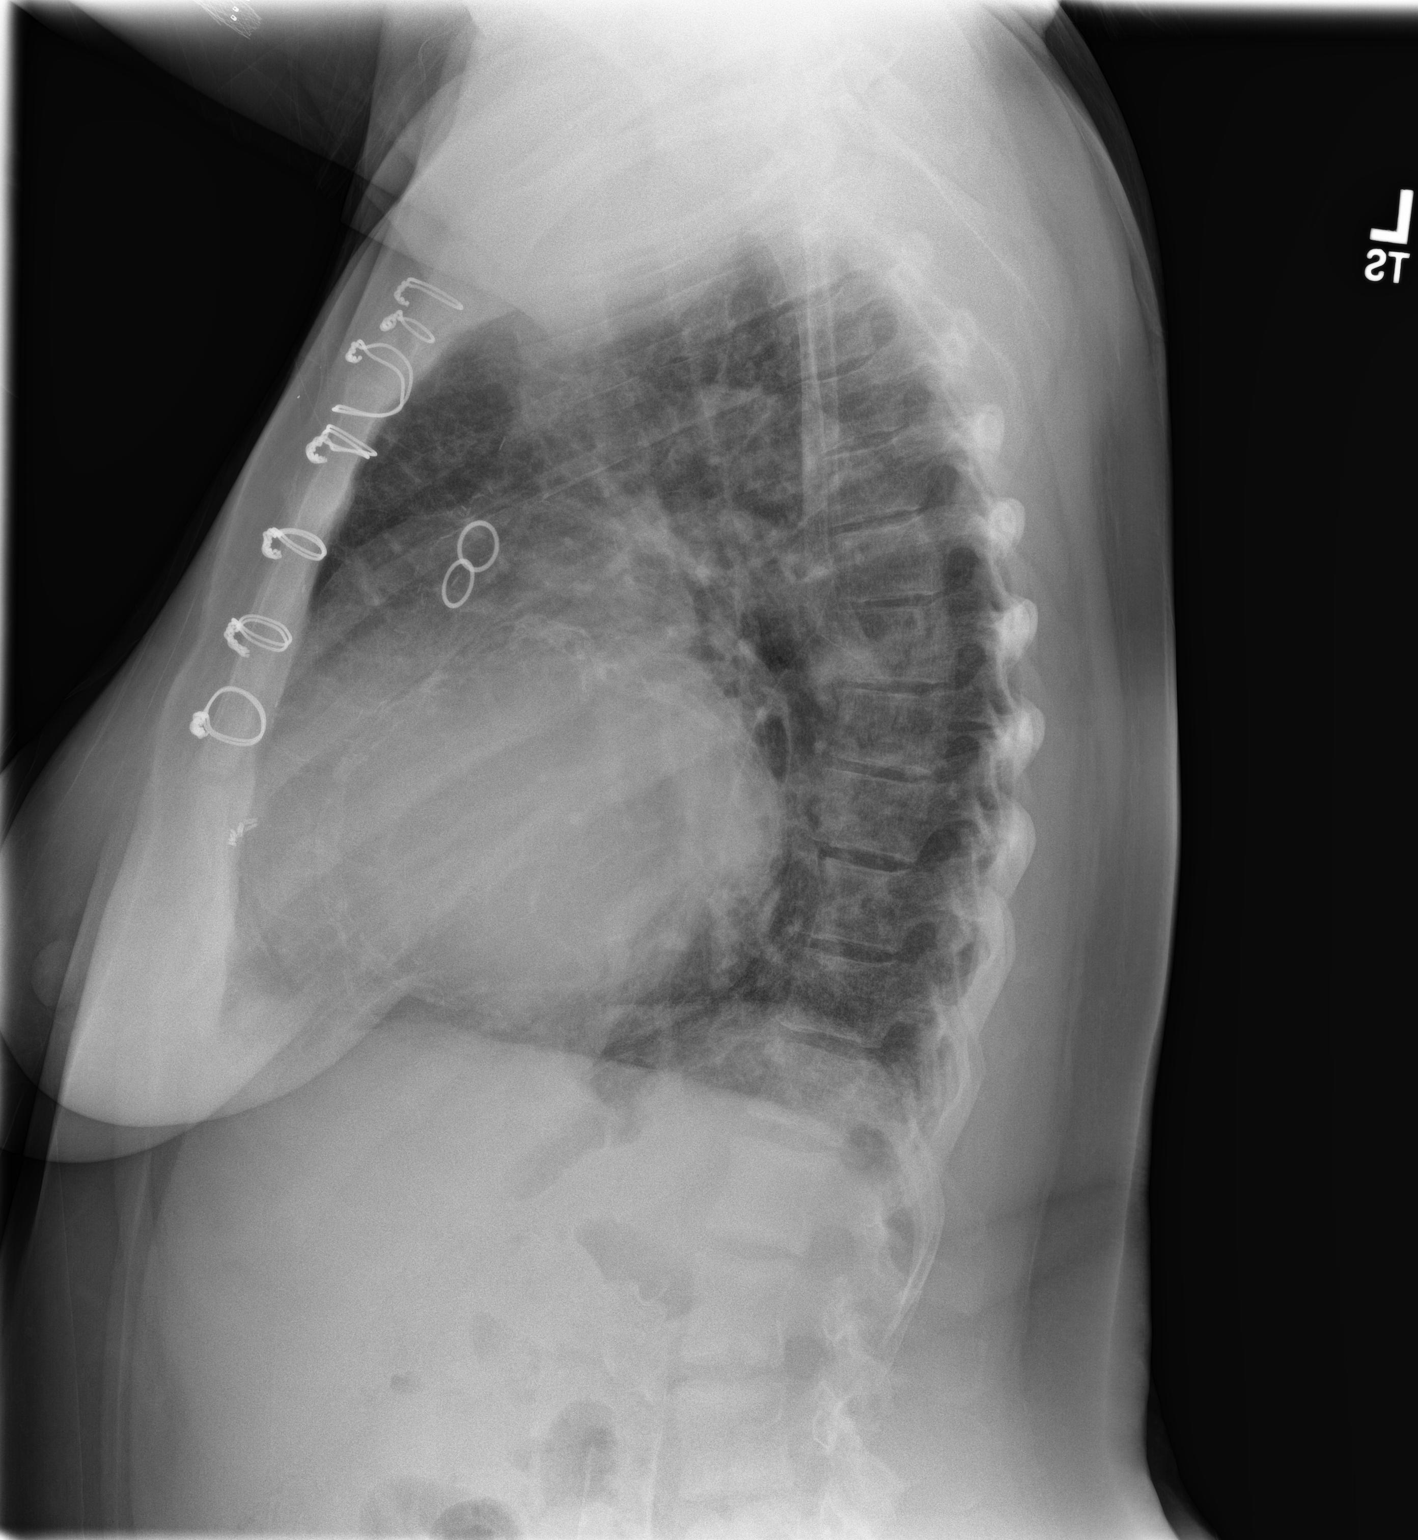

[2 of 2 positions shown; findings below may reference images not displayed]

FINDINGS: There are stable changes from previous CABG surgery. The cardiac
silhouette is moderately enlarged. There are dense coronary artery
calcifications.

No mediastinal or hilar masses.  No convincing adenopathy.

Clear lungs.  No pleural effusion.  No pneumothorax.

Skeletal structures are intact.
IMPRESSION: 1. No acute cardiopulmonary disease.
2. Moderate cardiomegaly, changes from prior CABG surgery and dense
coronary artery calcifications similar to the prior study.

## 2018-05-28 ENCOUNTER — Emergency Department: Payer: Medicare Other

## 2018-05-28 ENCOUNTER — Emergency Department
Admission: EM | Admit: 2018-05-28 | Discharge: 2018-05-28 | Disposition: A | Discharge status: Home / Self Care | Payer: Medicare Other | Attending: Emergency Medicine | Admitting: Emergency Medicine

## 2018-05-28 DIAGNOSIS — K529 Noninfective gastroenteritis and colitis, unspecified: Secondary | ICD-10-CM | POA: Diagnosis not present

## 2018-05-28 DIAGNOSIS — Y929 Unspecified place or not applicable: Secondary | ICD-10-CM | POA: Insufficient documentation

## 2018-05-28 DIAGNOSIS — Z79899 Other long term (current) drug therapy: Secondary | ICD-10-CM | POA: Diagnosis not present

## 2018-05-28 DIAGNOSIS — Z8673 Personal history of transient ischemic attack (TIA), and cerebral infarction without residual deficits: Secondary | ICD-10-CM | POA: Insufficient documentation

## 2018-05-28 DIAGNOSIS — I509 Heart failure, unspecified: Secondary | ICD-10-CM | POA: Diagnosis not present

## 2018-05-28 DIAGNOSIS — Z7982 Long term (current) use of aspirin: Secondary | ICD-10-CM | POA: Insufficient documentation

## 2018-05-28 DIAGNOSIS — Z951 Presence of aortocoronary bypass graft: Secondary | ICD-10-CM | POA: Insufficient documentation

## 2018-05-28 DIAGNOSIS — Z992 Dependence on renal dialysis: Secondary | ICD-10-CM | POA: Diagnosis not present

## 2018-05-28 DIAGNOSIS — I252 Old myocardial infarction: Secondary | ICD-10-CM | POA: Insufficient documentation

## 2018-05-28 DIAGNOSIS — S8001XA Contusion of right knee, initial encounter: Secondary | ICD-10-CM | POA: Diagnosis not present

## 2018-05-28 DIAGNOSIS — N186 End stage renal disease: Secondary | ICD-10-CM | POA: Insufficient documentation

## 2018-05-28 DIAGNOSIS — Z7901 Long term (current) use of anticoagulants: Secondary | ICD-10-CM | POA: Diagnosis not present

## 2018-05-28 DIAGNOSIS — I132 Hypertensive heart and chronic kidney disease with heart failure and with stage 5 chronic kidney disease, or end stage renal disease: Secondary | ICD-10-CM | POA: Insufficient documentation

## 2018-05-28 DIAGNOSIS — Y9389 Activity, other specified: Secondary | ICD-10-CM | POA: Insufficient documentation

## 2018-05-28 DIAGNOSIS — R0789 Other chest pain: Secondary | ICD-10-CM | POA: Diagnosis not present

## 2018-05-28 DIAGNOSIS — F17210 Nicotine dependence, cigarettes, uncomplicated: Secondary | ICD-10-CM | POA: Diagnosis not present

## 2018-05-28 DIAGNOSIS — Y999 Unspecified external cause status: Secondary | ICD-10-CM | POA: Diagnosis not present

## 2018-05-28 DIAGNOSIS — Z91040 Latex allergy status: Secondary | ICD-10-CM | POA: Diagnosis not present

## 2018-05-28 DIAGNOSIS — S8991XA Unspecified injury of right lower leg, initial encounter: Secondary | ICD-10-CM | POA: Diagnosis present

## 2018-05-28 DIAGNOSIS — W0110XA Fall on same level from slipping, tripping and stumbling with subsequent striking against unspecified object, initial encounter: Secondary | ICD-10-CM | POA: Insufficient documentation

## 2018-05-28 LAB — BASIC METABOLIC PANEL
Anion gap: 9 (ref 5–15)
BUN: 38 mg/dL — ABNORMAL HIGH (ref 6–20)
CALCIUM: 7.9 mg/dL — AB (ref 8.9–10.3)
CO2: 24 mmol/L (ref 22–32)
Chloride: 105 mmol/L (ref 98–111)
Creatinine, Ser: 6.29 mg/dL — ABNORMAL HIGH (ref 0.44–1.00)
GFR calc non Af Amer: 7 mL/min — ABNORMAL LOW (ref >60)
GFR, EST AFRICAN AMERICAN: 8 mL/min — AB (ref >60)
Glucose, Bld: 141 mg/dL — ABNORMAL HIGH (ref 70–99)
Potassium: 3.8 mmol/L (ref 3.5–5.1)
SODIUM: 138 mmol/L (ref 135–145)

## 2018-05-28 LAB — CBC
HEMATOCRIT: 33.2 % — AB (ref 36.0–46.0)
Hemoglobin: 10.3 g/dL — ABNORMAL LOW (ref 12.0–15.0)
MCH: 30 pg (ref 26.0–34.0)
MCHC: 31 g/dL (ref 30.0–36.0)
MCV: 96.8 fL (ref 80.0–100.0)
NRBC: 0 % (ref 0.0–0.2)
PLATELETS: 112 10*3/uL — AB (ref 150–400)
RBC: 3.43 MIL/uL — ABNORMAL LOW (ref 3.87–5.11)
RDW: 15 % (ref 11.5–15.5)
WBC: 3.6 10*3/uL — AB (ref 4.0–10.5)

## 2018-05-28 LAB — TROPONIN I
Troponin I: 0.05 ng/mL (ref <0.03)
Troponin I: 0.05 ng/mL (ref <0.03)

## 2018-05-28 MED ORDER — AZITHROMYCIN 500 MG PO TABS: 500.0000 mg | ORAL_TABLET | Freq: Every day | ORAL | 0 refills | Status: AC

## 2018-05-28 MED ORDER — OXYCODONE-ACETAMINOPHEN 5-325 MG PO TABS
1.0000 | ORAL_TABLET | Freq: Once | ORAL | Status: AC
  Administered 2018-05-28: 1 via ORAL
  Filled 2018-05-28: qty 1

## 2018-05-28 MED ORDER — HYDROCODONE-ACETAMINOPHEN 5-325 MG PO TABS: 1.0000 | ORAL_TABLET | Freq: Four times a day (QID) | ORAL | 0 refills | Status: DC | PRN

## 2018-05-28 NOTE — ED Provider Notes (Signed)
[facility] Emergency Department Provider Note ____________________________________________   First MD Initiated Contact with Patient 05/28/18 1707     (approximate)  I have reviewed the triage vital signs and the nursing notes.   HISTORY  Chief Complaint Chest Pain and Knee Pain    HPI [name] is a 57 y.o. female with PMH as noted below including ESRD on dialysis who presents with 3 primary complaints.  1.  Chest pain: Intermittent chest pain, described as sharp, nonexertional, and mainly substernal.  It does not radiate.  She has had it for a few weeks.  She denies associated shortness of breath.  2.  Abdominal pain: Intermittent left upper quadrant abdominal pain, occurring for 2 weeks, and associated with multiple episodes of diarrhea per day.  She denies nausea or vomiting.  No fever.  3.  Right knee pain: The patient fell yesterday and hurt her right knee.  She reports worsened swelling today and pain on bearing weight but she is able to bear weight.  Past Medical History:  Diagnosis Date  . CHF (congestive heart failure) ([place])   . Heart failure ([place])   . Hyperlipidemia   . Hypertension   . Myocardial infarction ([place])   . Paroxysmal atrial fibrillation (H[facility])   . Renal failure   . Renal insufficiency   . Stroke [facility] 2011    Patient Active Problem List   Diagnosis Date Noted  . Pneumonia 01/31/2018  . Swelling of limb 09/22/2016  . Kidney dialysis as the cause of abnormal reaction of the patient, or of later complication, without mention of misadventure at the time of the procedure (CODE) 03/24/2016  . Hyperkalemia 11/13/2014  . Generalized weakness 10/15/2014  . Chest pain 10/15/2014  . ESRD on hemodialysis ([place]) 10/15/2014  . Congestive heart failure (CHF) ([place]) 10/15/2014  . HTN (hypertension) 10/15/2014  . Sternal wound dehiscence 04/12/2014  . 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 Pericardial effusion 04/12/2014    . History of delirium 04/12/2014  . H/O atrial flutter 04/12/2014  . Delayed surgical wound healing 04/12/2014  . Chest wall pain following surgery 04/12/2014  . S/P CABG (coronary artery bypass graft) 04/02/2014  . Lactic acidosis 03/26/2014  . Arteriosclerotic dementia ([place]) 03/25/2014  . Atrial flutter by electrocardiogram ([place]) 03/22/2014  . Superficial incisional surgical site infection 03/14/2014  . Postoperative anemia due to acute blood loss 02/27/2014  . Obesity 02/27/2014  . H/O: substance abuse ([place]) 02/27/2014  . Anemia of chronic renal failure 02/27/2014  . Acute postoperative pain 02/27/2014  . Leukocytosis 02/26/2014  . Hypotension 02/24/2014  . Exhausted vascular access 02/24/2014  . Anemia of chronic disease 02/24/2014  . Stroke ([place]) 02/21/2014  . S/P CABG x 3 02/21/2014  . Dialysis patient ([place]) 02/21/2014  . Hyperlipidemia 02/13/2014  . History of hepatitis C 02/13/2014  . HFrEF (heart failure with reduced ejection fraction) ([place]) 02/13/2014  . H/O stroke without residual deficits 02/13/2014  . ESRD (end stage renal disease) on dialysis ([place]) 02/13/2014  . End stage renal disease with hypertension ([place]) 02/13/2014  . CAD, multiple vessel 02/13/2014  . Retrosternal chest pain 05/30/2013  . Gallstones 05/29/2013    Past Surgical History:  Procedure Laterality Date  . CORONARY ANGIOPLASTY WITH STENT PLACEMENT  2013  . CORONARY ARTERY BYPASS GRAFT    . DIALYSIS FISTULA CREATION      Prior to Admission medications   Medication Sig Start Date End Date Taking? Authorizing Provider  albuterol (PROVENTIL HFA;VENTOLIN HFA) 108 (  90 Base) MCG/ACT inhaler Inhale 2 puffs into the lungs every 4 (four) hours as needed for wheezing or shortness of breath.   Yes [provider]  aspirin 81 MG chewable tablet Chew 81 mg by mouth daily.   Yes [provider]  carvedilol (COREG) 12.5 MG tablet Take 12.5 mg by mouth 2 (two) times daily with a meal.    Yes  [provider]  cinacalcet (SENSIPAR) 60 MG tablet Take 60 mg by mouth every evening.    Yes [provider]  ELIQUIS 5 MG TABS tablet Take 1 tablet (5 mg total) by mouth 2 (two) times daily. 02/02/18  Yes Mayo, Pete Pelt, MD  hydrOXYzine (ATARAX/VISTARIL) 50 MG tablet Take 50 mg by mouth 2 (two) times daily as needed for itching.    Yes [provider]  levothyroxine (SYNTHROID, LEVOTHROID) 88 MCG tablet Take 88 mcg by mouth daily before breakfast.  03/03/18  Yes [provider]  midodrine (PROAMATINE) 10 MG tablet Take 10-20 mg by mouth daily. (for low BP associated with dialysis)   Yes [provider]  pantoprazole (PROTONIX) 40 MG tablet Take 1 tablet by mouth daily. 05/07/13  Yes [provider]  sacubitril-valsartan (ENTRESTO) 49-51 MG Take 1 tablet by mouth 2 (two) times daily.    Yes [provider]  sevelamer carbonate (RENVELA) 800 MG tablet Take 4 tablets (3200MG ) by mouth 3 times daily with meals and 2 tablets (1600MG ) by mouth twice daily with snacks   Yes [provider]  azithromycin (ZITHROMAX) 500 MG tablet Take 1 tablet (500 mg total) by mouth daily for 3 days. Take 1 tablet daily for 3 days. 05/28/18 05/31/18  Arta Silence, MD  HYDROcodone-acetaminophen (NORCO) 5-325 MG tablet Take 1 tablet by mouth every 6 (six) hours as needed for up to 5 days for moderate pain. 05/28/18 06/02/18  Arta Silence, MD  rosuvastatin (CRESTOR) 40 MG tablet Take 40 mg by mouth daily.    [provider]    Allergies Morphine and related; Levaquin [levofloxacin]; Latex; and Tape  Family History  Problem Relation Age of Onset  . Hypertension Other   . Cancer Other   . Renal Disease Other   . Ovarian cancer Mother   . Kidney disease Father     Social History Social History   Tobacco Use  . Smoking status: Current Every Day Smoker    Packs/day: 0.10    Years: 20.00    Pack years: 2.00    Types:  Cigarettes  . Smokeless tobacco: Never Used  Substance Use Topics  . Alcohol use: No  . Drug use: No    Review of Systems  Constitutional: No fever. Eyes: No redness. ENT: No neck pain. Cardiovascular: Positive for intermittent chest pain. Respiratory: Denies shortness of breath. Gastrointestinal: Positive for diarrhea. Genitourinary: Negative for dysuria.  Musculoskeletal: Negative for back pain.  Positive for right knee pain. Skin: Negative for rash. Neurological: Negative for headache.   ____________________________________________   PHYSICAL EXAM:  VITAL SIGNS: ED Triage Vitals  Enc Vitals Group     BP 05/28/18 1614 111/76     Pulse Rate 05/28/18 1614 76     Resp 05/28/18 1614 18     Temp 05/28/18 1614 98 F (36.7 C)     Temp Source 05/28/18 1614 Oral     SpO2 05/28/18 1614 93 %     Weight 05/28/18 1614 176 lb (79.8 kg)     Height 05/28/18 1614 5\' 6"  (1.676  m)     Head Circumference --      Peak Flow --      Pain Score 05/28/18 1625 7     Pain Loc --      Pain Edu? --      Excl. in [place]? --     Constitutional: Alert and oriented.  Relatively well appearing and in no acute distress. Eyes: Conjunctivae are normal.  No scleral icterus. Head: Atraumatic. Nose: No congestion/rhinnorhea. Mouth/Throat: Mucous membranes are moist.   Neck: Normal range of motion.  Cardiovascular: Normal rate, regular rhythm. Grossly normal heart sounds.  Good peripheral circulation. Respiratory: Normal respiratory effort.  No retractions. Lungs CTAB. Gastrointestinal: Soft with moderate left upper quadrant tenderness.  No peritoneal signs.  No distention.  Genitourinary: No flank tenderness. Musculoskeletal: No lower extremity edema.  Extremities warm and well perfused.  Right knee with moderate effusion and tenderness to palpation anteriorly.  No deformity.  Pain on range of motion.  Neurologic:  Normal speech and language. No gross focal neurologic deficits are appreciated.  Skin:   Skin is warm and dry. No rash noted. Psychiatric: Mood and affect are normal. Speech and behavior are normal.  ____________________________________________   LABS (all labs ordered are listed, but only abnormal results are displayed)  Labs Reviewed  BASIC METABOLIC PANEL - Abnormal; Notable for the following components:      Result Value   Glucose, Bld 141 (*)    BUN 38 (*)    Creatinine, Ser 6.29 (*)    Calcium 7.9 (*)    GFR calc non Af Amer 7 (*)    GFR calc Af Amer 8 (*)    All other components within normal limits  CBC - Abnormal; Notable for the following components:   WBC 3.6 (*)    RBC 3.43 (*)    Hemoglobin 10.3 (*)    HCT 33.2 (*)    Platelets 112 (*)    All other components within normal limits  TROPONIN I - Abnormal; Notable for the following components:   Troponin I 0.05 (*)    All other components within normal limits  TROPONIN I - Abnormal; Notable for the following components:   Troponin I 0.05 (*)    All other components within normal limits   ____________________________________________  EKG  ED ECG REPORT I, Arta Silence, the attending physician, personally viewed and interpreted this ECG.  Date: 05/28/2018 EKG Time: 1627 Rate: 71 Rhythm: normal sinus rhythm with prolonged PR QRS Axis: normal Intervals: Prolonged PR ST/T Wave abnormalities: Nonspecific ST abnormalities Narrative Interpretation: Nonspecific abnormalities with no evidence of acute ischemia; no significant change when compared to EKG of 04/23/2018  ____________________________________________  RADIOLOGY  XR R knee: Soft tissue swelling.  Degenerative changes.  No acute fracture CXR: No focal infiltrate or other acute abnormalities CT abdomen: Ascites.  Cholelithiasis.  Mild prominence of several bowel loops in left upper quadrant. ____________________________________________   PROCEDURES  Procedure(s) performed: No  Procedures  Critical Care performed:  No ____________________________________________   INITIAL IMPRESSION / ASSESSMENT AND PLAN / ED COURSE  Pertinent labs & imaging results that were available during my care of the patient were reviewed by me and considered in my medical decision making (see chart for details).  57 year old female with history of ESRD, CHF, and other PMH as noted above presents with subacute course of intermittent chest pain, as well as some left upper quadrant pain and diarrhea, and right knee injury.  On exam the patient is actually  quite well-appearing.  Her vital signs are normal except for borderline low blood pressure.  She has some left upper quadrant abdominal tenderness, and tenderness to the right knee with no deformity.  The right leg is neuro/vascular intact.  EKG is nonischemic.  I reviewed the past medical records in Epic; the patient was admitted last month for chest pain and evaluated by Dr. Humphrey Rolls.  She was ruled out for ACS and the chest pain was thought to be most likely musculoskeletal.  She was increased on doses of medications for her severe LV dysfunction.  Plan: 1.  Chest pain: Although the patient has elevated risk for ACS and has CHF, given the subacute nature of the pain, the patient's well appearance, and the EKG with no concerning acute findings I have low suspicion for ACS on this presentation.  We will obtain chest x-ray and cardiac enzymes x2.  2.  Abdominal pain: The patient is well-appearing but does have tenderness in the left upper quadrant and reports significant diarrhea.  Differential would include colitis, diverticulitis, or gastroenteritis.  We will obtain a CT abdomen (without contrast due to renal insufficiency).  3.  Knee pain: Presentation is consistent with contusion and knee effusion.  We will obtain x-ray to rule out fracture.  ----------------------------------------- 8:55 PM on 05/28/2018 -----------------------------------------  Initial troponin was 0.05 which  is not significantly elevated for this patient with ESRD.  Prior troponins are in a similar range.  She has no continued chest pain.  The remainder of the lab work-up is consistent with the patient's baseline.  X-ray of the knee showed no acute fracture but some degenerative changes and effusion, consistent with knee contusion.  CT abdomen showed some thickened loops of bowel in the left upper quadrant corresponding to the location of the patient's pain.  Given that the symptoms have been subacute and she has had persistent diarrhea, I will treat with azithromycin for presumed bacterial etiology.  The CT also shows some ascites although this is likely related to the patient's CHF and renal disease.  Repeat troponin was unchanged.  Given the lack of continued chest pain there is no evidence of ACS.  The patient is stable for discharge at this time, and feels comfortable going home.  I discussed the results of the work-up with her.  Return precautions given, and she expresses understanding. ____________________________________________   FINAL CLINICAL IMPRESSION(S) / ED DIAGNOSES  Final diagnoses:  Contusion of right knee, initial encounter  Atypical chest pain  Enteritis      NEW MEDICATIONS STARTED DURING THIS VISIT:  New Prescriptions   AZITHROMYCIN (ZITHROMAX) 500 MG TABLET    Take 1 tablet (500 mg total) by mouth daily for 3 days. Take 1 tablet daily for 3 days.   HYDROCODONE-ACETAMINOPHEN (NORCO) 5-325 MG TABLET    Take 1 tablet by mouth every 6 (six) hours as needed for up to 5 days for moderate pain.     Note:  This document was prepared using Dragon voice recognition software and may include unintentional dictation errors.   Arta Silence, MD 05/28/18 2057

## 2018-05-28 NOTE — ED Triage Notes (Signed)
Pt presents via POV c/o chest pain x2, and fall yesterday with right knee pain, and left sided abd pain x2 weeks. Pt is wearing an external defib per report.

## 2018-05-28 NOTE — ED Notes (Signed)
Pt taken to CT via stretcher.

## 2018-05-28 NOTE — ED Notes (Signed)
Patient reports pain meds effective, pain to right knee now 5/10.

## 2018-05-28 NOTE — Discharge Instructions (Addendum)
Take the antibiotic for the full 3-day course as prescribed.  You may take the pain medication as needed for the knee pain.  Return to the ER for new, worsening, persistent severe chest pain, difficulty breathing, abdominal pain, vomiting, fever, blood in the stool, or any other new or worsening symptoms that concern you.

## 2018-05-28 NOTE — ED Notes (Signed)
Trop drawn and sent.

## 2018-05-28 NOTE — ED Notes (Signed)
Patient returned from [place]. Reports abd pain 8/10

## 2018-06-01 ENCOUNTER — Emergency Department: Payer: Medicare Other

## 2018-06-01 ENCOUNTER — Encounter: Payer: Self-pay | Admitting: Emergency Medicine

## 2018-06-01 ENCOUNTER — Inpatient Hospital Stay
Admission: EM | Admit: 2018-06-01 | Discharge: 2018-06-04 | DRG: 291 | Disposition: A | Discharge status: Home / Self Care | Payer: Medicare Other | Attending: Internal Medicine | Admitting: Internal Medicine

## 2018-06-01 ENCOUNTER — Other Ambulatory Visit: Payer: Self-pay

## 2018-06-01 DIAGNOSIS — D631 Anemia in chronic kidney disease: Secondary | ICD-10-CM | POA: Diagnosis present

## 2018-06-01 DIAGNOSIS — I132 Hypertensive heart and chronic kidney disease with heart failure and with stage 5 chronic kidney disease, or end stage renal disease: Secondary | ICD-10-CM | POA: Diagnosis not present

## 2018-06-01 DIAGNOSIS — Z9119 Patient's noncompliance with other medical treatment and regimen: Secondary | ICD-10-CM

## 2018-06-01 DIAGNOSIS — Z8041 Family history of malignant neoplasm of ovary: Secondary | ICD-10-CM

## 2018-06-01 DIAGNOSIS — I5023 Acute on chronic systolic (congestive) heart failure: Secondary | ICD-10-CM | POA: Diagnosis present

## 2018-06-01 DIAGNOSIS — N2581 Secondary hyperparathyroidism of renal origin: Secondary | ICD-10-CM | POA: Diagnosis present

## 2018-06-01 DIAGNOSIS — I071 Rheumatic tricuspid insufficiency: Secondary | ICD-10-CM | POA: Diagnosis present

## 2018-06-01 DIAGNOSIS — F17210 Nicotine dependence, cigarettes, uncomplicated: Secondary | ICD-10-CM | POA: Diagnosis present

## 2018-06-01 DIAGNOSIS — K7460 Unspecified cirrhosis of liver: Secondary | ICD-10-CM | POA: Diagnosis present

## 2018-06-01 DIAGNOSIS — Z86711 Personal history of pulmonary embolism: Secondary | ICD-10-CM

## 2018-06-01 DIAGNOSIS — E785 Hyperlipidemia, unspecified: Secondary | ICD-10-CM | POA: Diagnosis present

## 2018-06-01 DIAGNOSIS — R079 Chest pain, unspecified: Secondary | ICD-10-CM | POA: Diagnosis present

## 2018-06-01 DIAGNOSIS — Z841 Family history of disorders of kidney and ureter: Secondary | ICD-10-CM

## 2018-06-01 DIAGNOSIS — R188 Other ascites: Secondary | ICD-10-CM | POA: Diagnosis present

## 2018-06-01 DIAGNOSIS — I480 Paroxysmal atrial fibrillation: Secondary | ICD-10-CM | POA: Diagnosis present

## 2018-06-01 DIAGNOSIS — Z992 Dependence on renal dialysis: Secondary | ICD-10-CM

## 2018-06-01 DIAGNOSIS — B1920 Unspecified viral hepatitis C without hepatic coma: Secondary | ICD-10-CM | POA: Diagnosis present

## 2018-06-01 DIAGNOSIS — Z91048 Other nonmedicinal substance allergy status: Secondary | ICD-10-CM

## 2018-06-01 DIAGNOSIS — Z91040 Latex allergy status: Secondary | ICD-10-CM

## 2018-06-01 DIAGNOSIS — D696 Thrombocytopenia, unspecified: Secondary | ICD-10-CM | POA: Diagnosis present

## 2018-06-01 DIAGNOSIS — Z7901 Long term (current) use of anticoagulants: Secondary | ICD-10-CM

## 2018-06-01 DIAGNOSIS — Z951 Presence of aortocoronary bypass graft: Secondary | ICD-10-CM

## 2018-06-01 DIAGNOSIS — K5900 Constipation, unspecified: Secondary | ICD-10-CM | POA: Diagnosis present

## 2018-06-01 DIAGNOSIS — I4510 Unspecified right bundle-branch block: Secondary | ICD-10-CM | POA: Diagnosis present

## 2018-06-01 DIAGNOSIS — Z881 Allergy status to other antibiotic agents status: Secondary | ICD-10-CM

## 2018-06-01 DIAGNOSIS — I429 Cardiomyopathy, unspecified: Secondary | ICD-10-CM | POA: Diagnosis present

## 2018-06-01 DIAGNOSIS — Z955 Presence of coronary angioplasty implant and graft: Secondary | ICD-10-CM

## 2018-06-01 DIAGNOSIS — Z79899 Other long term (current) drug therapy: Secondary | ICD-10-CM

## 2018-06-01 DIAGNOSIS — Z885 Allergy status to narcotic agent status: Secondary | ICD-10-CM

## 2018-06-01 DIAGNOSIS — F0150 Vascular dementia without behavioral disturbance: Secondary | ICD-10-CM | POA: Diagnosis present

## 2018-06-01 DIAGNOSIS — Z7982 Long term (current) use of aspirin: Secondary | ICD-10-CM

## 2018-06-01 DIAGNOSIS — I959 Hypotension, unspecified: Secondary | ICD-10-CM | POA: Diagnosis present

## 2018-06-01 DIAGNOSIS — I2510 Atherosclerotic heart disease of native coronary artery without angina pectoris: Secondary | ICD-10-CM | POA: Diagnosis present

## 2018-06-01 DIAGNOSIS — Z8673 Personal history of transient ischemic attack (TIA), and cerebral infarction without residual deficits: Secondary | ICD-10-CM

## 2018-06-01 DIAGNOSIS — N186 End stage renal disease: Secondary | ICD-10-CM | POA: Diagnosis present

## 2018-06-01 DIAGNOSIS — I252 Old myocardial infarction: Secondary | ICD-10-CM

## 2018-06-01 DIAGNOSIS — I672 Cerebral atherosclerosis: Secondary | ICD-10-CM | POA: Diagnosis present

## 2018-06-01 DIAGNOSIS — Z79890 Hormone replacement therapy (postmenopausal): Secondary | ICD-10-CM

## 2018-06-01 LAB — TROPONIN I: Troponin I: 0.04 ng/mL (ref <0.03)

## 2018-06-01 LAB — CBC WITH DIFFERENTIAL/PLATELET
Abs Immature Granulocytes: 0.01 10*3/uL (ref 0.00–0.07)
Basophils Absolute: 0 10*3/uL (ref 0.0–0.1)
Basophils Relative: 1 %
Eosinophils Absolute: 0.1 10*3/uL (ref 0.0–0.5)
Eosinophils Relative: 2 %
HCT: 31.3 % — ABNORMAL LOW (ref 36.0–46.0)
Hemoglobin: 9.5 g/dL — ABNORMAL LOW (ref 12.0–15.0)
Immature Granulocytes: 0 %
Lymphocytes Relative: 25 %
Lymphs Abs: 1 10*3/uL (ref 0.7–4.0)
MCH: 29.7 pg (ref 26.0–34.0)
MCHC: 30.4 g/dL (ref 30.0–36.0)
MCV: 97.8 fL (ref 80.0–100.0)
Monocytes Absolute: 0.6 10*3/uL (ref 0.1–1.0)
Monocytes Relative: 16 %
NEUTROS ABS: 2.3 10*3/uL (ref 1.7–7.7)
Neutrophils Relative %: 56 %
Platelets: 126 10*3/uL — ABNORMAL LOW (ref 150–400)
RBC: 3.2 MIL/uL — ABNORMAL LOW (ref 3.87–5.11)
RDW: 15 % (ref 11.5–15.5)
WBC: 3.9 10*3/uL — ABNORMAL LOW (ref 4.0–10.5)
nRBC: 0 % (ref 0.0–0.2)

## 2018-06-01 LAB — COMPREHENSIVE METABOLIC PANEL
ALBUMIN: 3.8 g/dL (ref 3.5–5.0)
ALT: 21 U/L (ref 0–44)
AST: 36 U/L (ref 15–41)
Alkaline Phosphatase: 96 U/L (ref 38–126)
Anion gap: 7 (ref 5–15)
BUN: 24 mg/dL — ABNORMAL HIGH (ref 6–20)
CO2: 26 mmol/L (ref 22–32)
Calcium: 8.2 mg/dL — ABNORMAL LOW (ref 8.9–10.3)
Chloride: 108 mmol/L (ref 98–111)
Creatinine, Ser: 5.2 mg/dL — ABNORMAL HIGH (ref 0.44–1.00)
GFR calc Af Amer: 10 mL/min — ABNORMAL LOW (ref >60)
GFR calc non Af Amer: 9 mL/min — ABNORMAL LOW (ref >60)
Glucose, Bld: 83 mg/dL (ref 70–99)
Potassium: 3.9 mmol/L (ref 3.5–5.1)
Sodium: 141 mmol/L (ref 135–145)
Total Bilirubin: 0.7 mg/dL (ref 0.3–1.2)
Total Protein: 7.8 g/dL (ref 6.5–8.1)

## 2018-06-01 LAB — LIPASE, BLOOD: Lipase: 69 U/L — ABNORMAL HIGH (ref 11–51)

## 2018-06-01 NOTE — ED Provider Notes (Signed)
[facility] Emergency Department Provider Note    ____________________________________________   I have reviewed the triage vital signs and the nursing notes.   HISTORY  Chief Complaint Chest Pain   History limited by: Not Limited   HPI [name] is a 57 y.o. female who presents to the emergency department today because of concern for chest pain, some shortness of breath. Patient feels like she is fluid overloaded. The patient does have ESRD and gets dialysis MWF. She states that today as well as Monday they were not able to draw off her normal amount of fluid. She states this was due to low blood pressure at dialysis. Recently started carvedilol 1 week ago. She describes the pain as being located in the central chest and upper epigastric region. She has also noticed associated leg swelling around her ankles.   Per medical record review patient has a history of CHF, HLD, HTN.   Past Medical History:  Diagnosis Date  . CHF (congestive heart failure) ([place])   . Heart failure ([place])   . Hyperlipidemia   . Hypertension   . Myocardial infarction ([place])   . Paroxysmal atrial fibrillation (HCC)   . Renal failure   . Renal insufficiency   . Stroke [facility]) 2011    Patient Active Problem List   Diagnosis Date Noted  . Pneumonia 01/31/2018  . Swelling of limb 09/22/2016  . Kidney dialysis as the cause of abnormal reaction of the patient, or of later complication, without mention of misadventure at the time of the procedure (CODE) 03/24/2016  . Hyperkalemia 11/13/2014  . Generalized weakness 10/15/2014  . Chest pain 10/15/2014  . ESRD on hemodialysis ([place]) 10/15/2014  . Congestive heart failure (CHF) ([place]) 10/15/2014  . HTN (hypertension) 10/15/2014  . Sternal wound dehiscence 04/12/2014  . 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 Pericardial effusion 04/12/2014  . History of delirium 04/12/2014  . H/O atrial flutter 04/12/2014  . Delayed surgical  wound healing 04/12/2014  . Chest wall pain following surgery 04/12/2014  . S/P CABG (coronary artery bypass graft) 04/02/2014  . Lactic acidosis 03/26/2014  . Arteriosclerotic dementia ([place]) 03/25/2014  . Atrial flutter by electrocardiogram ([place]) 03/22/2014  . Superficial incisional surgical site infection 03/14/2014  . Postoperative anemia due to acute blood loss 02/27/2014  . Obesity 02/27/2014  . H/O: substance abuse ([place]) 02/27/2014  . Anemia of chronic renal failure 02/27/2014  . Acute postoperative pain 02/27/2014  . Leukocytosis 02/26/2014  . Hypotension 02/24/2014  . Exhausted vascular access 02/24/2014  . Anemia of chronic disease 02/24/2014  . Stroke ([place]) 02/21/2014  . S/P CABG x 3 02/21/2014  . Dialysis patient ([place]) 02/21/2014  . Hyperlipidemia 02/13/2014  . History of hepatitis C 02/13/2014  . HFrEF (heart failure with reduced ejection fraction) ([place]) 02/13/2014  . H/O stroke without residual deficits 02/13/2014  . ESRD (end stage renal disease) on dialysis ([place]) 02/13/2014  . End stage renal disease with hypertension ([place]) 02/13/2014  . CAD, multiple vessel 02/13/2014  . Retrosternal chest pain 05/30/2013  . Gallstones 05/29/2013    Past Surgical History:  Procedure Laterality Date  . CORONARY ANGIOPLASTY WITH STENT PLACEMENT  2013  . CORONARY ARTERY BYPASS GRAFT    . DIALYSIS FISTULA CREATION      Prior to Admission medications   Medication Sig Start Date End Date Taking? Authorizing Provider  albuterol (PROVENTIL HFA;VENTOLIN HFA) 108 (90 Base) MCG/ACT inhaler Inhale 2 puffs into the lungs every 4 (four) hours as needed for  wheezing or shortness of breath.    [provider]  aspirin 81 MG chewable tablet Chew 81 mg by mouth daily.    [provider]  carvedilol (COREG) 12.5 MG tablet Take 12.5 mg by mouth 2 (two) times daily with a meal.     [provider]  cinacalcet (SENSIPAR) 60 MG tablet Take 60 mg by mouth every evening.      [provider]  ELIQUIS 5 MG TABS tablet Take 1 tablet (5 mg total) by mouth 2 (two) times daily. 02/02/18   Mayo, Pete Pelt, MD  HYDROcodone-acetaminophen (NORCO) 5-325 MG tablet Take 1 tablet by mouth every 6 (six) hours as needed for up to 5 days for moderate pain. 05/28/18 06/02/18  Arta Silence, MD  hydrOXYzine (ATARAX/VISTARIL) 50 MG tablet Take 50 mg by mouth 2 (two) times daily as needed for itching.     [provider]  levothyroxine (SYNTHROID, LEVOTHROID) 88 MCG tablet Take 88 mcg by mouth daily before breakfast.  03/03/18   [provider]  midodrine (PROAMATINE) 10 MG tablet Take 10-20 mg by mouth daily. (for low BP associated with dialysis)    [provider]  pantoprazole (PROTONIX) 40 MG tablet Take 1 tablet by mouth daily. 05/07/13   [provider]  rosuvastatin (CRESTOR) 40 MG tablet Take 40 mg by mouth daily.    [provider]  sacubitril-valsartan (ENTRESTO) 49-51 MG Take 1 tablet by mouth 2 (two) times daily.     [provider]  sevelamer carbonate (RENVELA) 800 MG tablet Take 4 tablets (3200MG ) by mouth 3 times daily with meals and 2 tablets (1600MG ) by mouth twice daily with snacks    [provider]    Allergies Morphine and related; Levaquin [levofloxacin]; Latex; and Tape  Family History  Problem Relation Age of Onset  . Hypertension Other   . Cancer Other   . Renal Disease Other   . Ovarian cancer Mother   . Kidney disease Father     Social History Social History   Tobacco Use  . Smoking status: Current Every Day Smoker    Packs/day: 0.10    Years: 20.00    Pack years: 2.00    Types: Cigarettes  . Smokeless tobacco: Never Used  Substance Use Topics  . Alcohol use: No  . Drug use: No    Review of Systems Constitutional: No fever/chills Eyes: No visual changes. ENT: No sore throat. Cardiovascular: Positive for chest pain. Respiratory: Positive for shortness of  breath. Gastrointestinal: Positive for epigastric pain Genitourinary: Negative for dysuria. Musculoskeletal: Positive for bilateral ankle and foot swelling.  Skin: Negative for rash. Neurological: Negative for headaches, focal weakness or numbness.  ____________________________________________   PHYSICAL EXAM:  VITAL SIGNS: ED Triage Vitals  Enc Vitals Group     BP 06/01/18 2147 (!) 88/56     Pulse Rate 06/01/18 2147 70     Resp 06/01/18 2147 (!) 22     Temp 06/01/18 2147 98.3 F (36.8 C)     Temp Source 06/01/18 2147 Oral     SpO2 06/01/18 2147 94 %     Weight 06/01/18 2148 174 lb (78.9 kg)     Height 06/01/18 2148 5\' 6"  (1.676 m)     Head Circumference --      Peak Flow --      Pain Score 06/01/18 2146 9   Constitutional: Alert and oriented.  Eyes: Conjunctivae are normal.  ENT      Head:  Normocephalic and atraumatic.      Nose: No congestion/rhinnorhea.      Mouth/Throat: Mucous membranes are moist.      Neck: No stridor. Hematological/Lymphatic/Immunilogical: No cervical lymphadenopathy. Cardiovascular: Normal rate, regular rhythm.  No murmurs, rubs, or gallops.  Respiratory: Slightly increased respiratory rate and effort. Lung sounds clear.  Gastrointestinal: Soft and non tender. No rebound. No guarding.  Genitourinary: Deferred Musculoskeletal: Normal range of motion in all extremities. Bilateral 1+ edema of ankles Neurologic:  Normal speech and language. No gross focal neurologic deficits are appreciated.  Skin:  Skin is warm, dry and intact. No rash noted. Psychiatric: Mood and affect are normal. Speech and behavior are normal. Patient exhibits appropriate insight and judgment.  ____________________________________________    LABS (pertinent positives/negatives)  Lipase 69 CBC wbc 3.9, hgb 9.5, plt 126 CMP wnl except bun 24, cr 5.20, ca 8.2  ____________________________________________   EKG  I, Nance Pear, attending physician, personally viewed  and interpreted this EKG  EKG Time: 2143 Rate: 71 Rhythm: sinus rhythm with pvc Axis: normal Intervals: qtc 508 QRS: RBBB ST changes: no st elevation Impression: abnormal ekg  ____________________________________________    RADIOLOGY  CXR Pulmonary edema   ____________________________________________   PROCEDURES  Procedures  ____________________________________________   INITIAL IMPRESSION / ASSESSMENT AND PLAN / ED COURSE  Pertinent labs & imaging results that were available during my care of the patient were reviewed by me and considered in my medical decision making (see chart for details).   Patient presented because of concern for chest pain, shortness of breath and swelling. Patient has not had her normal fluid drawn off over the past two times she was at dialysis. The patient does have some swelling and slight increase in respiratory effort and rate, however satting fine on room air. Patient was placed on 2L [place] for comfort. Patient discussed with hospitalist and nephrology. Discussed findings and plan for admission with patient.   ____________________________________________   FINAL CLINICAL IMPRESSION(S) / ED DIAGNOSES  Final diagnoses:  Nonspecific chest pain     Note: This dictation was prepared with Dragon dictation. Any transcriptional errors that result from this process are unintentional     Nance Pear, MD 06/02/18 [phone]

## 2018-06-01 NOTE — ED Notes (Signed)
Lab called with critical troponin results of 0.04. EDP made aware. No new orders at this time.

## 2018-06-01 NOTE — ED Triage Notes (Signed)
Pt to triage via w/c, appears uncomfortable; Pt reports upper CP, left upper abd pain, SHOB; dialysis performed today; swelling to LE(S)

## 2018-06-01 NOTE — ED Notes (Signed)
Patient reports going to dialysis today and did noty get dialyzed. Reports chest pain and sob. Pedal edema noted. Sating 95% on room air.

## 2018-06-02 ENCOUNTER — Other Ambulatory Visit: Payer: Self-pay

## 2018-06-02 LAB — MAGNESIUM: Magnesium: 2.2 mg/dL (ref 1.7–2.4)

## 2018-06-02 LAB — PHOSPHORUS
Phosphorus: 3.1 mg/dL (ref 2.5–4.6)
Phosphorus: 3 mg/dL (ref 2.5–4.6)

## 2018-06-02 LAB — TROPONIN I
Troponin I: 0.05 ng/mL (ref <0.03)
Troponin I: 0.04 ng/mL (ref <0.03)

## 2018-06-02 LAB — MRSA PCR SCREENING: MRSA by PCR: POSITIVE — AB

## 2018-06-02 MED ORDER — SEVELAMER CARBONATE 800 MG PO TABS
3200.0000 mg | ORAL_TABLET | Freq: Three times a day (TID) | ORAL | Status: DC
  Administered 2018-06-02 – 2018-06-04 (×7): 3200 mg via ORAL
  Filled 2018-06-02 (×7): qty 4

## 2018-06-02 MED ORDER — LEVOTHYROXINE SODIUM 88 MCG PO TABS
88.0000 ug | ORAL_TABLET | Freq: Every day | ORAL | Status: DC
  Administered 2018-06-02 – 2018-06-04 (×2): 88 ug via ORAL
  Filled 2018-06-02 (×4): qty 1

## 2018-06-02 MED ORDER — HEPARIN SODIUM (PORCINE) 1000 UNIT/ML DIALYSIS
20.0000 [IU]/kg | INTRAMUSCULAR | Status: DC | PRN
  Filled 2018-06-02: qty 2

## 2018-06-02 MED ORDER — HEPARIN SODIUM (PORCINE) 1000 UNIT/ML DIALYSIS
1000.0000 [IU] | INTRAMUSCULAR | Status: DC | PRN
  Filled 2018-06-02: qty 1

## 2018-06-02 MED ORDER — HYDROMORPHONE HCL 1 MG/ML IJ SOLN
0.5000 mg | INTRAMUSCULAR | Status: DC | PRN
  Administered 2018-06-02 – 2018-06-04 (×10): 0.5 mg via INTRAVENOUS
  Filled 2018-06-02 (×10): qty 1

## 2018-06-02 MED ORDER — PENTAFLUOROPROP-TETRAFLUOROETH EX AERO
1.0000 "application " | INHALATION_SPRAY | CUTANEOUS | Status: DC | PRN
  Filled 2018-06-02: qty 30

## 2018-06-02 MED ORDER — PANTOPRAZOLE SODIUM 40 MG PO TBEC
40.0000 mg | DELAYED_RELEASE_TABLET | Freq: Two times a day (BID) | ORAL | Status: DC
  Administered 2018-06-02 – 2018-06-04 (×4): 40 mg via ORAL
  Filled 2018-06-02 (×5): qty 1

## 2018-06-02 MED ORDER — ORAL CARE MOUTH RINSE
15.0000 mL | Freq: Two times a day (BID) | OROMUCOSAL | Status: DC
  Administered 2018-06-03: 15 mL via OROMUCOSAL

## 2018-06-02 MED ORDER — ONDANSETRON HCL 4 MG/2ML IJ SOLN: 4.0000 mg | Freq: Four times a day (QID) | INTRAMUSCULAR | Status: DC | PRN

## 2018-06-02 MED ORDER — CARVEDILOL 3.125 MG PO TABS: 3.1250 mg | ORAL_TABLET | Freq: Two times a day (BID) | ORAL | Status: DC

## 2018-06-02 MED ORDER — ASPIRIN 81 MG PO CHEW
81.0000 mg | CHEWABLE_TABLET | Freq: Every day | ORAL | Status: DC
  Administered 2018-06-04: 81 mg via ORAL
  Filled 2018-06-02 (×2): qty 1

## 2018-06-02 MED ORDER — APIXABAN 5 MG PO TABS
5.0000 mg | ORAL_TABLET | Freq: Two times a day (BID) | ORAL | Status: DC
  Administered 2018-06-02 – 2018-06-04 (×3): 5 mg via ORAL
  Filled 2018-06-02 (×4): qty 1

## 2018-06-02 MED ORDER — SODIUM CHLORIDE 0.9 % IV SOLN: 100.0000 mL | INTRAVENOUS | Status: DC | PRN

## 2018-06-02 MED ORDER — HYDROXYZINE HCL 25 MG PO TABS
50.0000 mg | ORAL_TABLET | Freq: Two times a day (BID) | ORAL | Status: DC | PRN
  Administered 2018-06-02 – 2018-06-03 (×2): 50 mg via ORAL
  Filled 2018-06-02 (×2): qty 2

## 2018-06-02 MED ORDER — BISACODYL 5 MG PO TBEC: 5.0000 mg | DELAYED_RELEASE_TABLET | Freq: Every day | ORAL | Status: DC | PRN

## 2018-06-02 MED ORDER — LIDOCAINE-PRILOCAINE 2.5-2.5 % EX CREA
1.0000 "application " | TOPICAL_CREAM | CUTANEOUS | Status: DC | PRN
  Filled 2018-06-02: qty 5

## 2018-06-02 MED ORDER — PANTOPRAZOLE SODIUM 40 MG PO TBEC: 40.0000 mg | DELAYED_RELEASE_TABLET | Freq: Every day | ORAL | Status: DC

## 2018-06-02 MED ORDER — CARVEDILOL 3.125 MG PO TABS
3.1250 mg | ORAL_TABLET | Freq: Every day | ORAL | Status: DC
  Administered 2018-06-02 – 2018-06-03 (×2): 3.125 mg via ORAL
  Filled 2018-06-02 (×2): qty 1

## 2018-06-02 MED ORDER — ACETAMINOPHEN 325 MG PO TABS: 650.0000 mg | ORAL_TABLET | ORAL | Status: DC | PRN

## 2018-06-02 MED ORDER — HYDROCODONE-ACETAMINOPHEN 5-325 MG PO TABS
1.0000 | ORAL_TABLET | Freq: Four times a day (QID) | ORAL | Status: DC | PRN
  Administered 2018-06-04 (×3): 1 via ORAL
  Filled 2018-06-02 (×3): qty 1

## 2018-06-02 MED ORDER — SEVELAMER CARBONATE 800 MG PO TABS
1600.0000 mg | ORAL_TABLET | Freq: Two times a day (BID) | ORAL | Status: DC
  Filled 2018-06-02: qty 2

## 2018-06-02 MED ORDER — MIDODRINE HCL 5 MG PO TABS
10.0000 mg | ORAL_TABLET | Freq: Every day | ORAL | Status: DC
  Administered 2018-06-02 – 2018-06-04 (×3): 10 mg via ORAL
  Filled 2018-06-02 (×3): qty 2

## 2018-06-02 MED ORDER — SACUBITRIL-VALSARTAN 49-51 MG PO TABS
1.0000 | ORAL_TABLET | Freq: Two times a day (BID) | ORAL | Status: DC
  Administered 2018-06-02 – 2018-06-04 (×3): 1 via ORAL
  Filled 2018-06-02 (×7): qty 1

## 2018-06-02 MED ORDER — ROSUVASTATIN CALCIUM 10 MG PO TABS
40.0000 mg | ORAL_TABLET | Freq: Every day | ORAL | Status: DC
  Administered 2018-06-02 – 2018-06-03 (×2): 40 mg via ORAL
  Filled 2018-06-02 (×2): qty 4

## 2018-06-02 MED ORDER — POLYETHYLENE GLYCOL 3350 17 G PO PACK
17.0000 g | PACK | Freq: Every day | ORAL | Status: DC
  Filled 2018-06-02: qty 1

## 2018-06-02 MED ORDER — ALBUTEROL SULFATE (2.5 MG/3ML) 0.083% IN NEBU: 2.5000 mg | INHALATION_SOLUTION | Freq: Four times a day (QID) | RESPIRATORY_TRACT | Status: DC | PRN

## 2018-06-02 MED ORDER — CHLORHEXIDINE GLUCONATE CLOTH 2 % EX PADS
6.0000 | MEDICATED_PAD | Freq: Every day | CUTANEOUS | Status: DC
  Administered 2018-06-02 – 2018-06-04 (×3): 6 via TOPICAL

## 2018-06-02 MED ORDER — CINACALCET HCL 30 MG PO TABS
60.0000 mg | ORAL_TABLET | Freq: Every evening | ORAL | Status: DC
  Administered 2018-06-02 – 2018-06-03 (×2): 60 mg via ORAL
  Filled 2018-06-02 (×3): qty 2

## 2018-06-02 MED ORDER — MUPIROCIN 2 % EX OINT
1.0000 "application " | TOPICAL_OINTMENT | Freq: Two times a day (BID) | CUTANEOUS | Status: DC
  Administered 2018-06-02 – 2018-06-04 (×3): 1 via NASAL
  Filled 2018-06-02: qty 22

## 2018-06-02 MED ORDER — LIDOCAINE HCL (PF) 1 % IJ SOLN
5.0000 mL | INTRAMUSCULAR | Status: DC | PRN
  Filled 2018-06-02: qty 5

## 2018-06-02 MED ORDER — SENNOSIDES-DOCUSATE SODIUM 8.6-50 MG PO TABS: 1.0000 | ORAL_TABLET | Freq: Every evening | ORAL | Status: DC | PRN

## 2018-06-02 NOTE — Progress Notes (Signed)
Post HD Assessment    06/02/18 1430  Neurological  Level of Consciousness Alert  Orientation Level Oriented X4  Respiratory  Respiratory Pattern Regular;Unlabored;Symmetrical  Chest Assessment Chest expansion symmetrical  Bilateral Breath Sounds Clear  Cardiac  Pulse Regular  Heart Sounds S1, S2  Jugular Venous Distention (JVD) No  ECG Monitor Yes  Cardiac Rhythm NSR;Heart block  Heart Block Type 1st degree AVB  Ectopy Unifocal PVC's  Ectopy Frequency Frequent  Antiarrhythmic device No  Vascular  R Radial Pulse +2  L Radial Pulse +2  R Dorsalis Pedis Pulse +1  L Dorsalis Pedis Pulse +1  Edema Right lower extremity;Left lower extremity (pedal edema/ swollen knee)  RLE Edema +1  LLE Edema +1  Integumentary  Integumentary (WDL) WDL  Musculoskeletal  Musculoskeletal (WDL) X  Generalized Weakness Yes  GU Assessment  Genitourinary (WDL) X (HD patient)  Genitourinary Symptoms Anuria  Psychosocial  Psychosocial (WDL) WDL

## 2018-06-02 NOTE — H&P (Addendum)
[facility] [place] [facility] NAME: [name]    MR#:  [id number]  DATE OF BIRTH:  28-May-1961  DATE OF ADMISSION:  06/01/2018  PRIMARY CARE PHYSICIAN: [name], MD   REQUESTING/REFERRING PHYSICIAN: Nance Pear, MD  CHIEF COMPLAINT:   Chief Complaint  Patient presents with  . Chest Pain    HISTORY OF PRESENT ILLNESS:  [name]  is a 57 y.o. female with a known history of CAD/MI (CABG x3), chronic systolic CHF/ICM (w/ Lifevest, EF 19% as of 04/06/2018), Afib (Eliquis), CVA, ESRD (HD M/W/F) p/w CP/SOB, volume overload. She is AAOx3, but is a poor historian. Had HD on M + W, (-) UF (2/2 hypotension). Endorses CP/SOB chronic, progressively worsening x3-4d. Endorses edema. Also c/o LLQ AP, sharp, random, severe, but she appears comfortable; BM WNL, (-) blood, CT A/P 05/28/2018 (-). Anuric. Says her Cardiologist is [employer].  PAST MEDICAL HISTORY:   Past Medical History:  Diagnosis Date  . CHF (congestive heart failure) ([place])   . Heart failure ([place])   . Hyperlipidemia   . Hypertension   . Myocardial infarction ([place])   . Paroxysmal atrial fibrillation (H[facility])   . Renal failure   . Renal insufficiency   . Stroke [facility]) 2011    PAST SURGICAL HISTORY:   Past Surgical History:  Procedure Laterality Date  . CORONARY ANGIOPLASTY WITH STENT PLACEMENT  2013  . CORONARY ARTERY BYPASS GRAFT    . DIALYSIS FISTULA CREATION      SOCIAL HISTORY:   Social History   Tobacco Use  . Smoking status: Current Every Day Smoker    Packs/day: 0.10    Years: 20.00    Pack years: 2.00    Types: Cigarettes  . Smokeless tobacco: Never Used  Substance Use Topics  . Alcohol use: No    FAMILY HISTORY:   Family History  Problem Relation Age of Onset  . Hypertension Other   . Cancer Other   . Renal Disease Other   . Ovarian cancer Mother   . Kidney disease Father     DRUG ALLERGIES:   Allergies  Allergen Reactions  .  Morphine And Related Hives  . Levaquin [Levofloxacin] Itching    Severe itching; prickly sensation  . Latex Rash  . Tape Rash    REVIEW OF SYSTEMS:   Review of Systems  Constitutional: Negative for chills, diaphoresis, fever, malaise/fatigue and weight loss.  HENT: Negative for congestion, ear pain, hearing loss, nosebleeds, sinus pain, sore throat and tinnitus.   Eyes: Negative for blurred vision, double vision and photophobia.  Respiratory: Positive for shortness of breath. Negative for cough, hemoptysis, sputum production and wheezing.   Cardiovascular: Positive for chest pain and leg swelling. Negative for palpitations, orthopnea, claudication and PND.  Gastrointestinal: Positive for abdominal pain. Negative for blood in stool, constipation, diarrhea, heartburn, melena, nausea and vomiting.  Genitourinary: Negative for dysuria, frequency, hematuria and urgency.  Musculoskeletal: Negative for back pain, falls, joint pain, myalgias and neck pain.  Skin: Negative for itching and rash.  Neurological: Negative for dizziness, tingling, tremors, sensory change, speech change, focal weakness, seizures, loss of consciousness, weakness and headaches.  Psychiatric/Behavioral: Negative for depression and memory loss. The patient is not nervous/anxious and does not have insomnia.    MEDICATIONS AT HOME:   Prior to Admission medications   Medication Sig Start Date End Date Taking? Authorizing Provider  albuterol (PROVENTIL HFA;VENTOLIN HFA) 108 (90 Base) MCG/ACT inhaler Inhale 2 puffs  into the lungs every 4 (four) hours as needed for wheezing or shortness of breath.   Yes [provider]  aspirin 81 MG chewable tablet Chew 81 mg by mouth daily.   Yes [provider]  carvedilol (COREG) 12.5 MG tablet Take 12.5 mg by mouth 2 (two) times daily with a meal.    Yes [provider]  cinacalcet (SENSIPAR) 60 MG tablet Take 60 mg by mouth every evening.    Yes [provider]  ELIQUIS 5 MG TABS tablet Take 1 tablet (5 mg total) by mouth 2 (two) times daily. 02/02/18  Yes Mayo, Pete Pelt, MD  HYDROcodone-acetaminophen (NORCO) 5-325 MG tablet Take 1 tablet by mouth every 6 (six) hours as needed for up to 5 days for moderate pain. 05/28/18 06/02/18 Yes Arta Silence, MD  hydrOXYzine (ATARAX/VISTARIL) 50 MG tablet Take 50 mg by mouth 2 (two) times daily as needed for itching.    Yes [provider]  levothyroxine (SYNTHROID, LEVOTHROID) 88 MCG tablet Take 88 mcg by mouth at bedtime.  03/03/18  Yes [provider]  midodrine (PROAMATINE) 10 MG tablet Take 10-20 mg by mouth daily. (for low BP associated with dialysis)   Yes [provider]  pantoprazole (PROTONIX) 40 MG tablet Take 1 tablet by mouth at bedtime.  05/07/13  Yes [provider]  rosuvastatin (CRESTOR) 40 MG tablet Take 40 mg by mouth daily.   Yes [provider]  sacubitril-valsartan (ENTRESTO) 49-51 MG Take 1 tablet by mouth 2 (two) times daily.    Yes [provider]  sevelamer carbonate (RENVELA) 800 MG tablet Take 4 tablets (3200MG ) by mouth 3 times daily with meals and 2 tablets (1600MG ) by mouth twice daily with snacks   Yes [provider]      VITAL SIGNS:  Blood pressure 102/75, pulse (!) 47, temperature 98.3 F (36.8 C), temperature source Oral, resp. rate 15, height 5\' 6"  (1.676 m), weight 78.9 kg, SpO2 100 %.  PHYSICAL EXAMINATION:  Physical Exam Constitutional:      General: She is awake. She is not in acute distress.    Appearance: She is well-developed. She is not ill-appearing, toxic-appearing or diaphoretic.     Interventions: Nasal cannula in place.  HENT:     Head: Normocephalic and atraumatic.  Eyes:     General: Lids are normal. No scleral icterus.    Extraocular Movements: Extraocular movements intact.     Conjunctiva/sclera: Conjunctivae normal.  Neck:     Musculoskeletal: Neck supple.    Cardiovascular:     Rate and Rhythm: Normal rate and regular rhythm.     Heart sounds: S1 normal and S2 normal. Heart sounds not distant. No murmur. No friction rub. No gallop. No S3 or S4 sounds.   Pulmonary:     Effort: Pulmonary effort is normal. No respiratory distress.     Breath sounds: No stridor. Examination of the right-upper field reveals decreased breath sounds. Examination of the left-upper field reveals decreased breath sounds. Examination of the right-middle field reveals decreased breath sounds. Examination of the left-middle field reveals decreased breath sounds. Examination of the right-lower field reveals decreased breath sounds and rales. Examination of the left-lower field reveals decreased breath sounds and rales. Decreased breath sounds and rales present. No wheezing or rhonchi.  Abdominal:     General: Abdomen is flat. Bowel sounds are decreased. There is no distension.     Palpations: Abdomen is soft.     Tenderness: There is  abdominal tenderness in the left lower quadrant. There is no guarding or rebound.  Musculoskeletal:     Right lower leg: Edema (Trace/1+ B/L LE edema) present.     Left lower leg: Edema (Trace/1+ B/L LE edema) present.  Lymphadenopathy:     Cervical: No cervical adenopathy.  Skin:    General: Skin is warm and dry.  Neurological:     Mental Status: She is alert and oriented to person, place, and time. Mental status is at baseline.  Psychiatric:        Attention and Perception: Attention and perception normal.        Mood and Affect: Mood and affect normal.        Speech: Speech normal.        Behavior: Behavior normal. Behavior is cooperative.        Thought Content: Thought content normal.        Cognition and Memory: Cognition and memory normal.        Judgment: Judgment normal.    LABORATORY PANEL:   CBC Recent Labs  Lab 06/01/18 2207  WBC 3.9*  HGB 9.5*  HCT 31.3*  PLT 126*    ------------------------------------------------------------------------------------------------------------------  Chemistries  Recent Labs  Lab 06/01/18 2207  NA 141  K 3.9  CL 108  CO2 26  GLUCOSE 83  BUN 24*  CREATININE 5.20*  CALCIUM 8.2*  AST 36  ALT 21  ALKPHOS 96  BILITOT 0.7   ------------------------------------------------------------------------------------------------------------------  Cardiac Enzymes Recent Labs  Lab 06/01/18 2207  TROPONINI 0.04*   ------------------------------------------------------------------------------------------------------------------  RADIOLOGY:  Dg Chest Portable 1 View  Result Date: 06/01/2018 CLINICAL DATA:  Upper chest pain, abdominal pain and shortness of breath. Status post dialysis today. EXAM: PORTABLE CHEST 1 VIEW COMPARISON:  Chest radiograph May 28, 2018 FINDINGS: Stable cardiomegaly. Coronary artery calcification or stent. Status post median sternotomy for CABG. Mildly increased pulmonary vascular congestion and mild interstitial prominence. No pleural effusion or focal consolidation. No pneumothorax. Vascular stent LEFT arm. IMPRESSION: Stable cardiomegaly. Increasing interstitial prominence most consistent with pulmonary edema. Electronically Signed   By: Elon Alas M.D.   On: 06/01/2018 22:08   IMPRESSION AND PLAN:   A/P: 8F CAD/MI (CABG x3), chronic systolic CHF/ICM (w/ Lifevest, EF 19% as of 04/06/2018), Afib (Eliquis), CVA, ESRD (HD M/W/F) p/w CP/SOB, volume overload, acute on chronic systolic CHF exacerbation, cardiorenal syndrome. LLQ AP. Hypocalcemia, troponin elevation, normocytic anemia, thrombocytopenia. -CP/SOB, volume overload, chronic systolic CHF/ICM, ESRD, troponin elevation: Pt p/w 3-4d Hx progressively worsening CP/SOB, acute on chronic. EF 19% as of 04/06/2018 per Dr. April Manson note from 04/21/2018. Has Lifevest, may need AICD. (-) UF x2 HD session (M + W), 2/2 hypotension.  Cardiorenal Syndrome Type IV + III, +/- Type V. Acute clinical volume overload (edema on exam + CXR). Trop-I 0.04, chronic leak since 10/2014, rpt pend. Not ordered for rpt Echo (< 47mo). Tele, cardiac monitoring. c/w ASA, Eliquis, cardiac meds (Coreg at reduced dose, Entresto, statin). Midodrine. Nephrology consult for HD. c/w renal meds (Sensipar, Renvela). -AP: c/o LLQ AP, sharp, random, severe, but she appears comfortable; BM WNL, (-) blood, CT A/P 05/28/2018 (-). Pain ctrl. -Hypocalcemia: Ionized calcium. c/w Sensipar. Phos level pending. c/w Renvela. -Normocytic anemia, thrombocytopenia: Anemia of chronic (kidney) disease. Hx thrombocytopenia dating back as far as 11/2013. No evidence of acute/active bleeding at present. -c/w other home meds/formulary subs. -FEN/GI: Renal diabetic free water restricted diet. -DVT PPx: Eliquis. -Code status: Full code. -Disposition: Observation, < 2 midnights.   All  the records are reviewed and case discussed with ED provider. Management plans discussed with the patient, family and they are in agreement.  CODE STATUS: Full code.  TOTAL TIME TAKING CARE OF THIS PATIENT: 75 minutes.    Arta Silence M.D on 06/02/2018 at 12:04 AM  Between 7am to 6pm - Pager - [phone]  After 6pm go to [email or web address] - Proofreader  [facility]  Office  [phone]  CC: Primary care physician; [name], MD   Note: This dictation was prepared with Dragon dictation along with smaller phrase technology. Any transcriptional errors that result from this process are unintentional.

## 2018-06-02 NOTE — Progress Notes (Addendum)
[place], [place] 06/02/18  Subjective:   Patient known to our practice from outpatient dialysis.  She is admitted to the hospital for chest pain and shortness of breath. Seen during dialysis   HEMODIALYSIS FLOWSHEET:  Blood Flow Rate (mL/min): 400 mL/min Arterial Pressure (mmHg): -170 mmHg Venous Pressure (mmHg): 180 mmHg Transmembrane Pressure (mmHg): 60 mmHg Ultrafiltration Rate (mL/min): 710 mL/min Dialysate Flow Rate (mL/min): 600 ml/min Conductivity: Machine : 14 Conductivity: Machine : 14 Dialysis Fluid Bolus: Normal Saline Bolus Amount (mL): 250 mL    Objective:  Vital signs in last 24 hours:  Temp:  [97.3 F (36.3 C)-98.4 F (36.9 C)] 98 F (36.7 C) (12/19 1415) Pulse Rate:  [47-79] 70 (12/19 1558) Resp:  [11-31] 18 (12/19 1558) BP: (50-116)/(36-81) 103/72 (12/19 1558) SpO2:  [93 %-100 %] 100 % (12/19 1558) Weight:  [78.9 kg-82.3 kg] 79.9 kg (12/19 1415)  Weight change:  Filed Weights   06/02/18 0058 06/02/18 1000 06/02/18 1415  Weight: 81.6 kg 82.3 kg 79.9 kg    Intake/Output:    Intake/Output Summary (Last 24 hours) at 06/02/2018 1637 Last data filed at 06/02/2018 1415 Gross per 24 hour  Intake -  Output 2000 ml  Net -2000 ml     Physical Exam: General:  No acute distress, laying in the bed  HEENT  anicteric, moist oral mucous membranes  Neck  supple  Pulm/lungs  mild basilar crackles bilaterally  CVS/Heart  no rub  Abdomen:   Soft, nontender  Extremities:  Trace edema  Neurologic:  Alert, oriented  Skin:  No acute rashes  Access:  Left arm AV fistula       Basic Metabolic Panel:  Recent Labs  Lab 05/28/18 1634 06/01/18 2207 06/02/18 0305 06/02/18 0631  NA 138 141  --   --   K 3.8 3.9  --   --   CL 105 108  --   --   CO2 24 26  --   --   GLUCOSE 141* 83  --   --   BUN 38* 24*  --   --   CREATININE 6.29* 5.20*  --   --   CALCIUM 7.9* 8.2*  --   --   MG  --   --  2.2  --   PHOS  --   --  3.1 3.0      CBC: Recent Labs  Lab 05/28/18 1634 06/01/18 2207  WBC 3.6* 3.9*  NEUTROABS  --  2.3  HGB 10.3* 9.5*  HCT 33.2* 31.3*  MCV 96.8 97.8  PLT 112* 126*      Lab Results  Component Value Date   HEPBSAG Negative 07/23/2017      Microbiology:  Recent Results (from the past 240 hour(s))  MRSA PCR Screening     Status: Abnormal   Collection Time: 06/02/18 12:55 AM  Result Value Ref Range Status   MRSA by PCR POSITIVE (A) NEGATIVE Final    Comment:        The GeneXpert MRSA Assay (FDA approved for NASAL specimens only), is one component of a comprehensive MRSA colonization surveillance program. It is not intended to diagnose MRSA infection nor to guide or monitor treatment for MRSA infections. RESULT CALLED TO, READ BACK BY AND VERIFIED WITH: RN KARA @0225  06/02/18 AKT Performed at [facility], [address] [place], [place] [address]     Coagulation Studies: No results for input(s): LABPROT, INR in the last 72 hours.  Urinalysis: No  results for input(s): COLORURINE, LABSPEC, [place], GLUCOSEU, HGBUR, BILIRUBINUR, KETONESUR, PROTEINUR, UROBILINOGEN, NITRITE, LEUKOCYTESUR in the last 72 hours.  Invalid input(s): APPERANCEUR    Imaging: Dg Chest Portable 1 View  Result Date: 06/01/2018 CLINICAL DATA:  Upper chest pain, abdominal pain and shortness of breath. Status post dialysis today. EXAM: PORTABLE CHEST 1 VIEW COMPARISON:  Chest radiograph May 28, 2018 FINDINGS: Stable cardiomegaly. Coronary artery calcification or stent. Status post median sternotomy for CABG. Mildly increased pulmonary vascular congestion and mild interstitial prominence. No pleural effusion or focal consolidation. No pneumothorax. Vascular stent LEFT arm. IMPRESSION: Stable cardiomegaly. Increasing interstitial prominence most consistent with pulmonary edema. Electronically Signed   By: Elon Alas M.D.   On: 06/01/2018 22:08     Medications:    . apixaban  5  mg Oral BID  . aspirin  81 mg Oral Daily  . carvedilol  3.125 mg Oral QHS  . Chlorhexidine Gluconate Cloth  6 each Topical Q0600  . cinacalcet  60 mg Oral QPM  . levothyroxine  88 mcg Oral QHS  . mouth rinse  15 mL Mouth Rinse BID  . midodrine  10 mg Oral Daily  . mupirocin ointment  1 application Nasal BID  . pantoprazole  40 mg Oral BID AC  . rosuvastatin  40 mg Oral Daily  . sacubitril-valsartan  1 tablet Oral BID  . sevelamer carbonate  1,600 mg Oral BID  . sevelamer carbonate  3,200 mg Oral TID WC   acetaminophen, albuterol, bisacodyl, HYDROcodone-acetaminophen, HYDROmorphone (DILAUDID) injection, hydrOXYzine, ondansetron (ZOFRAN) IV, senna-docusate  Assessment/ Plan:  57 y.o.African-American female with ESRD on HD MWF, hypertension, congestive heart failure, anemia of CKD, SHPTH, myocardial infarction, hx of CVA, prior episodes of hyperkalemia, pulmonary embolism 01/2018,   [place] /MWF/EDW 75.5 kg  1.  Shortness of breath from pulmonary edema 2.  End-stage renal disease 3.  Anemia of chronic kidney disease 4.  Secondary hyperparathyroidism  Outpatient, target weight is 75.5 kg.  Ultrafiltration as outpatient was limited because of hypotension.  Will change carvedilol to evening only to allow for higher blood pressures during the day Goal 2 L removed with dialysis today Next hemodialysis treatment tomorrow   LOS: 0 Louis Gaw Candiss Norse 12/19/20194:37 PM  [place], [place]  Note: This note was prepared with Dragon dictation. Any transcription errors are unintentional

## 2018-06-02 NOTE — Progress Notes (Signed)
Pre HD Assessment    06/02/18 0955  Neurological  Level of Consciousness Alert  Orientation Level Oriented X4  Respiratory  Respiratory Pattern Regular;Unlabored;Symmetrical  Chest Assessment Chest expansion symmetrical  Bilateral Breath Sounds Clear  Cough None  Cardiac  Pulse Regular  Heart Sounds S1, S2  Jugular Venous Distention (JVD) No  ECG Monitor Yes  Cardiac Rhythm NSR;Heart block  Heart Block Type 1st degree AVB  Ectopy Unifocal PVC's  Ectopy Frequency Frequent  Antiarrhythmic device No  Vascular  R Radial Pulse +2  L Radial Pulse +2  R Dorsalis Pedis Pulse +1  L Dorsalis Pedis Pulse +1  Edema Right lower extremity;Left lower extremity (pedal edema/ swollen knee)  RLE Edema +1  LLE Edema +1  Integumentary  Integumentary (WDL) WDL  Musculoskeletal  Musculoskeletal (WDL) X  Generalized Weakness Yes  GU Assessment  Genitourinary (WDL) X (HD patient)  Genitourinary Symptoms Anuria  Psychosocial  Psychosocial (WDL) WDL

## 2018-06-02 NOTE — Care Management Note (Signed)
Case Management Note  Patient Details  Name: [name] MRN: [id number] Date of Birth: May 27, 1961  Subjective/Objective:      Patient is from home with daughter.  She has been placed in observation for CP.  SHe is a MWF HD at [facility]  Hx CABG X3 and chronic Eliquis.  Chronic 3L oxygen at home.  Independent in all adls, denies issues accessing medical care, obtaining medications or with transportation.  Current with PCP.  Has functioning scale.                  Action/Plan:   Expected Discharge Date:                  Expected Discharge Plan:  Home/Self Care  In-House Referral:     Discharge planning Services  CM Consult  Post Acute Care Choice:    Choice offered to:     DME Arranged:    DME Agency:     HH Arranged:    HH Agency:     Status of Service:  In process, will continue to follow  If discussed at Long Length of Stay Meetings, dates discussed:    Additional Comments:  Elza Rafter, RN 06/02/2018, 4:32 PM

## 2018-06-02 NOTE — Progress Notes (Signed)
HD Treatment Initiated    06/02/18 1015  Vital Signs  Pulse Rate 64  Pulse Rate Source Monitor  Resp (!) 23  BP (!) 91/41  BP Location Right Arm  BP Method Automatic  Patient Position (if appropriate) Lying  Oxygen Therapy  SpO2 95 %  O2 Device Nasal Cannula  During Hemodialysis Assessment  Blood Flow Rate (mL/min) 400 mL/min  Arterial Pressure (mmHg) -170 mmHg  Venous Pressure (mmHg) 120 mmHg  Transmembrane Pressure (mmHg) 60 mmHg  Ultrafiltration Rate (mL/min) 700 mL/min  Dialysate Flow Rate (mL/min) 600 ml/min  Conductivity: Machine  14  HD Safety Checks Performed Yes  Dialysis Fluid Bolus Normal Saline  Bolus Amount (mL) 250 mL  Intra-Hemodialysis Comments Tx initiated;Progressing as prescribed  Fistula / Graft Left Upper arm Arteriovenous fistula  No Placement Date or Time found.   Placed prior to admission: Yes  Orientation: Left  Access Location: Upper arm  Access Type: Arteriovenous fistula  Status Accessed  Needle Size 15

## 2018-06-02 NOTE — Progress Notes (Signed)
Post HD Treatment  Pt tolerated treatment well. Her net UF was 2000 mL and her BVP was 77.9. All blood was returned to patient. No complaints noted. Report called to primary RN Janett Billow.    06/02/18 1415  Hand-Off documentation  Report given to (Full Name) Georgina Quint, RN  Report received from (Full Name) Stephannie Peters, RN  Vital Signs  Temp 98 F (36.7 C)  Temp Source Oral  Pulse Rate 77  Pulse Rate Source Monitor  Resp 14  BP 104/69  BP Location Right Arm  BP Method Automatic  Patient Position (if appropriate) Lying  Oxygen Therapy  SpO2 97 %  O2 Device Nasal Cannula  Pain Assessment  Pain Scale 0-10  Pain Score 0  Dialysis Weight  Weight 79.9 kg  Type of Weight Post-Dialysis  Post-Hemodialysis Assessment  Rinseback Volume (mL) 250 mL  KECN 257 V  Dialyzer Clearance Lightly streaked  Duration of HD Treatment -hour(s) 3.5 hour(s)  Hemodialysis Intake (mL) 500 mL  UF Total -Machine (mL) 2500 mL  Net UF (mL) 2000 mL  Tolerated HD Treatment Yes  Post-Hemodialysis Comments Pt tolerated treatment well  AVG/AVF Arterial Site Held (minutes) 30 minutes  AVG/AVF Venous Site Held (minutes) 10 minutes  Fistula / Graft Left Upper arm Arteriovenous fistula  No Placement Date or Time found.   Placed prior to admission: Yes  Orientation: Left  Access Location: Upper arm  Access Type: Arteriovenous fistula  Site Condition No complications  Fistula / Graft Assessment Present;Thrill;Bruit  Status Deaccessed  Drainage Description None

## 2018-06-02 NOTE — Progress Notes (Signed)
HD Treatment Complete    06/02/18 1400  Vital Signs  Pulse Rate 66  Pulse Rate Source Monitor  Resp 12  BP (!) 85/72  BP Location Right Arm  BP Method Automatic  Patient Position (if appropriate) Lying  Oxygen Therapy  SpO2 97 %  O2 Device Nasal Cannula  O2 Flow Rate (L/min) 3 L/min  During Hemodialysis Assessment  Blood Flow Rate (mL/min) 400 mL/min  Arterial Pressure (mmHg) -170 mmHg  Venous Pressure (mmHg) 180 mmHg  Transmembrane Pressure (mmHg) 60 mmHg  Ultrafiltration Rate (mL/min) 710 mL/min  Dialysate Flow Rate (mL/min) 600 ml/min  Conductivity: Machine  14  HD Safety Checks Performed Yes  Intra-Hemodialysis Comments Tolerated well;Tx completed (UF 2500)

## 2018-06-02 NOTE — Care Management Obs Status (Signed)
[place] NOTIFICATION   Patient Details  Name: [name] MRN: [id number] Date of Birth: 08-Apr-1961   Medicare Observation Status Notification Given:  Yes    Elza Rafter, RN 06/02/2018, 4:11 PM

## 2018-06-03 DIAGNOSIS — Z992 Dependence on renal dialysis: Secondary | ICD-10-CM | POA: Diagnosis not present

## 2018-06-03 DIAGNOSIS — Z79890 Hormone replacement therapy (postmenopausal): Secondary | ICD-10-CM | POA: Diagnosis not present

## 2018-06-03 DIAGNOSIS — I5023 Acute on chronic systolic (congestive) heart failure: Secondary | ICD-10-CM | POA: Diagnosis present

## 2018-06-03 DIAGNOSIS — I132 Hypertensive heart and chronic kidney disease with heart failure and with stage 5 chronic kidney disease, or end stage renal disease: Secondary | ICD-10-CM | POA: Diagnosis present

## 2018-06-03 DIAGNOSIS — Z951 Presence of aortocoronary bypass graft: Secondary | ICD-10-CM | POA: Diagnosis not present

## 2018-06-03 DIAGNOSIS — I2510 Atherosclerotic heart disease of native coronary artery without angina pectoris: Secondary | ICD-10-CM | POA: Diagnosis present

## 2018-06-03 DIAGNOSIS — Z955 Presence of coronary angioplasty implant and graft: Secondary | ICD-10-CM | POA: Diagnosis not present

## 2018-06-03 DIAGNOSIS — R079 Chest pain, unspecified: Secondary | ICD-10-CM | POA: Diagnosis present

## 2018-06-03 DIAGNOSIS — Z7901 Long term (current) use of anticoagulants: Secondary | ICD-10-CM | POA: Diagnosis not present

## 2018-06-03 DIAGNOSIS — Z841 Family history of disorders of kidney and ureter: Secondary | ICD-10-CM | POA: Diagnosis not present

## 2018-06-03 DIAGNOSIS — I252 Old myocardial infarction: Secondary | ICD-10-CM | POA: Diagnosis not present

## 2018-06-03 DIAGNOSIS — I4510 Unspecified right bundle-branch block: Secondary | ICD-10-CM | POA: Diagnosis present

## 2018-06-03 DIAGNOSIS — F17210 Nicotine dependence, cigarettes, uncomplicated: Secondary | ICD-10-CM | POA: Diagnosis present

## 2018-06-03 DIAGNOSIS — D631 Anemia in chronic kidney disease: Secondary | ICD-10-CM | POA: Diagnosis present

## 2018-06-03 DIAGNOSIS — F0150 Vascular dementia without behavioral disturbance: Secondary | ICD-10-CM | POA: Diagnosis present

## 2018-06-03 DIAGNOSIS — R188 Other ascites: Secondary | ICD-10-CM | POA: Diagnosis present

## 2018-06-03 DIAGNOSIS — Z8041 Family history of malignant neoplasm of ovary: Secondary | ICD-10-CM | POA: Diagnosis not present

## 2018-06-03 DIAGNOSIS — I480 Paroxysmal atrial fibrillation: Secondary | ICD-10-CM | POA: Diagnosis present

## 2018-06-03 DIAGNOSIS — N186 End stage renal disease: Secondary | ICD-10-CM | POA: Diagnosis present

## 2018-06-03 DIAGNOSIS — E785 Hyperlipidemia, unspecified: Secondary | ICD-10-CM | POA: Diagnosis present

## 2018-06-03 DIAGNOSIS — Z7982 Long term (current) use of aspirin: Secondary | ICD-10-CM | POA: Diagnosis not present

## 2018-06-03 DIAGNOSIS — I672 Cerebral atherosclerosis: Secondary | ICD-10-CM | POA: Diagnosis present

## 2018-06-03 DIAGNOSIS — N2581 Secondary hyperparathyroidism of renal origin: Secondary | ICD-10-CM | POA: Diagnosis present

## 2018-06-03 DIAGNOSIS — Z79899 Other long term (current) drug therapy: Secondary | ICD-10-CM | POA: Diagnosis not present

## 2018-06-03 DIAGNOSIS — Z8673 Personal history of transient ischemic attack (TIA), and cerebral infarction without residual deficits: Secondary | ICD-10-CM | POA: Diagnosis not present

## 2018-06-03 LAB — HEPATITIS B SURFACE ANTIGEN: HEP B S AG: NEGATIVE

## 2018-06-03 LAB — HEPATITIS B SURFACE ANTIBODY, QUANTITATIVE: Hep B S AB Quant (Post): <3.1 m[IU]/mL — ABNORMAL LOW (ref >9.9)

## 2018-06-03 LAB — CALCIUM, IONIZED: Calcium, Ionized, Serum: 4.3 mg/dL — ABNORMAL LOW (ref 4.5–5.6)

## 2018-06-03 MED ORDER — RENA-VITE PO TABS
1.0000 | ORAL_TABLET | Freq: Every day | ORAL | Status: DC
  Administered 2018-06-03: 1 via ORAL
  Filled 2018-06-03 (×2): qty 1

## 2018-06-03 MED ORDER — LACTULOSE 10 GM/15ML PO SOLN
10.0000 g | Freq: Two times a day (BID) | ORAL | Status: DC
  Administered 2018-06-03 – 2018-06-04 (×2): 10 g via ORAL
  Filled 2018-06-03 (×2): qty 30

## 2018-06-03 MED ORDER — POLYETHYLENE GLYCOL 3350 17 G PO PACK
17.0000 g | PACK | Freq: Every day | ORAL | Status: DC
  Administered 2018-06-03 – 2018-06-04 (×2): 17 g via ORAL
  Filled 2018-06-03 (×2): qty 1

## 2018-06-03 MED ORDER — VITAMIN C 500 MG PO TABS
250.0000 mg | ORAL_TABLET | Freq: Two times a day (BID) | ORAL | Status: DC
  Administered 2018-06-03 – 2018-06-04 (×2): 250 mg via ORAL
  Filled 2018-06-03 (×2): qty 1

## 2018-06-03 MED ORDER — SODIUM CHLORIDE 0.9% FLUSH
10.0000 mL | Freq: Two times a day (BID) | INTRAVENOUS | Status: DC
  Administered 2018-06-03 – 2018-06-04 (×2): 10 mL via INTRAVENOUS

## 2018-06-03 NOTE — Progress Notes (Signed)
[place] [facility] [place] NAME: [name]    MR#:  [id number]  DATE OF BIRTH:  September 15, 1960  SUBJECTIVE:  CHIEF COMPLAINT:   Chief Complaint  Patient presents with  . Chest Pain   Complains epigastric and left upper quadrant abdominal pain.  Dialysis session yesterday and today.  No vomiting or diarrhea.  REVIEW OF SYSTEMS:    Review of Systems  Constitutional: Positive for malaise/fatigue. Negative for chills and fever.  HENT: Negative for sore throat.   Eyes: Negative for blurred vision, double vision and pain.  Respiratory: Negative for cough, hemoptysis, shortness of breath and wheezing.   Cardiovascular: Negative for chest pain, palpitations, orthopnea and leg swelling.  Gastrointestinal: Positive for abdominal pain. Negative for constipation, diarrhea, heartburn, nausea and vomiting.  Genitourinary: Negative for dysuria and hematuria.  Musculoskeletal: Negative for back pain and joint pain.  Skin: Negative for rash.  Neurological: Negative for sensory change, speech change, focal weakness and headaches.  Endo/Heme/Allergies: Does not bruise/bleed easily.  Psychiatric/Behavioral: Negative for depression. The patient is not nervous/anxious.     DRUG ALLERGIES:   Allergies  Allergen Reactions  . Morphine And Related Hives  . Levaquin [Levofloxacin] Itching    Severe itching; prickly sensation  . Latex Rash  . Tape Rash    VITALS:  Blood pressure 103/75, pulse 75, temperature 98.6 F (37 C), resp. rate 12, height 5\' 6"  (1.676 m), weight 78 kg, SpO2 98 %.  PHYSICAL EXAMINATION:   Physical Exam  GENERAL:  57 y.o.-year-old patient lying in the bed with no acute distress.  EYES: Pupils equal, round, reactive to light and accommodation. No scleral icterus. Extraocular muscles intact.  HEENT: Head atraumatic, normocephalic. Oropharynx and nasopharynx clear.  NECK:  Supple, no jugular venous distention. No thyroid  enlargement, no tenderness.  LUNGS: Normal breath sounds bilaterally, no wheezing, rales, rhonchi. No use of accessory muscles of respiration.  CARDIOVASCULAR: S1, S2 normal. No murmurs, rubs, or gallops.  ABDOMEN: Soft, nontender, nondistended. Bowel sounds present. No organomegaly or mass.  EXTREMITIES: No cyanosis, clubbing or edema b/l.    NEUROLOGIC: Cranial nerves II through XII are intact. No focal Motor or sensory deficits b/l.   PSYCHIATRIC: The patient is alert and oriented x 3.  SKIN: No obvious rash, lesion, or ulcer.   LABORATORY PANEL:   CBC Recent Labs  Lab 06/01/18 2207  WBC 3.9*  HGB 9.5*  HCT 31.3*  PLT 126*   ------------------------------------------------------------------------------------------------------------------ Chemistries  Recent Labs  Lab 06/01/18 2207 06/02/18 0305  NA 141  --   K 3.9  --   CL 108  --   CO2 26  --   GLUCOSE 83  --   BUN 24*  --   CREATININE 5.20*  --   CALCIUM 8.2*  --   MG  --  2.2  AST 36  --   ALT 21  --   ALKPHOS 96  --   BILITOT 0.7  --    ------------------------------------------------------------------------------------------------------------------  Cardiac Enzymes Recent Labs  Lab 06/02/18 0631  TROPONINI 0.04*   ------------------------------------------------------------------------------------------------------------------  RADIOLOGY:  Dg Chest Portable 1 View  Result Date: 06/01/2018 CLINICAL DATA:  Upper chest pain, abdominal pain and shortness of breath. Status post dialysis today. EXAM: PORTABLE CHEST 1 VIEW COMPARISON:  Chest radiograph May 28, 2018 FINDINGS: Stable cardiomegaly. Coronary artery calcification or stent. Status post median sternotomy for CABG. Mildly increased pulmonary vascular congestion and mild interstitial prominence. No pleural effusion or focal  consolidation. No pneumothorax. Vascular stent LEFT arm. IMPRESSION: Stable cardiomegaly. Increasing interstitial prominence  most consistent with pulmonary edema. Electronically Signed   By: Elon Alas M.D.   On: 06/01/2018 22:08     ASSESSMENT AND PLAN:   * Acute on chronic systolic chf likely due to non complaince Cardiomyopathy Life vest in place Improving with ongoing dialysis.  *End-stage renal disease on hemodialysis Dialysis yesterday and today. Discussed with nephrology Dr. Candiss Norse.  *Hepatitis C with cirrhosis Likely cause of abdominal pain.  Mild ascites on CT scan 1 week back.  Needs outpatient GI follow-up.  Abdominal pain contributed by constipation.  Lactulose ordered by nephrology.  Likely discharge tomorrow once constipation resolved.  All the records are reviewed and case discussed with Care Management/Social Worker Management plans discussed with the patient, family and they are in agreement.  CODE STATUS: FULL CODE  DVT Prophylaxis: SCDs  TOTAL TIME TAKING CARE OF THIS PATIENT: 35 minutes.   POSSIBLE D/C IN 1-2 DAYS, DEPENDING ON CLINICAL CONDITION.  Leia Alf Norlan Rann M.D on 06/03/2018 at 7:10 PM  Between 7am to 6pm - Pager - [phone]  After 6pm go to [email or web address] - password [email or web address] [place] [facility]  Office  [phone]  CC: Primary care physician; [name], MD  Note: This dictation was prepared with Dragon dictation along with smaller phrase technology. Any transcriptional errors that result from this process are unintentional.

## 2018-06-03 NOTE — Plan of Care (Signed)
Nutrition Education Note  RD consulted for nutrition education regarding low sodium diet   57 y.o. African-American female with ESRD on HD MWF, hypertension, congestive heart failure, anemia of CKD, SHPTH, myocardial infarction, hx of CVA, prior episodes of hyperkalemia, pulmonary embolism 01/2018  Met with pt in room today. Pt reports good appetite and oral intake pta and today. Pt does not follow any particular diet at home. Pt reports eating "mainly whatever I want." RD provided renal diet and low sodium education today at bedside. Pt reports that she does not like salt or add this to foods. Pt does not take any supplements or vitamins at home. RD educated pt in the importance of adequate nutrition in relation to dialysis. Recommend daily renal multivitamin and adequate protein intake. RD will add vitamin C and rena-vite to replace losses from HD. RD will also liberalize diet as a renal diet is restrictive to protein and pt's electrolytes are wnl.   RD provided "Low Sodium Nutrition Therapy" handout from the Academy of Nutrition and Dietetics. Reviewed patient's dietary recall. Provided examples on ways to decrease sodium intake in diet. Discouraged intake of processed foods and use of salt shaker. Encouraged fresh fruits and vegetables as well as whole grain sources of carbohydrates to maximize fiber intake.   RD discussed why it is important for patient to adhere to diet recommendations, and emphasized the role of fluids, foods to avoid, and importance of weighing self daily. Teach back method used.  Expect good compliance.  Body mass index is 27.75 kg/m. Pt meets criteria for normal weight based on current BMI.  Current diet order is low sodium, patient is consuming approximately 100% of meals at this time. Labs and medications reviewed. No further nutrition interventions warranted at this time. RD contact information provided. If additional nutrition issues arise, please re-consult RD.    Koleen Distance MS, RD, LDN Pager #- [phone] Office#- [phone] After Hours Pager: [phone]

## 2018-06-03 NOTE — Discharge Instructions (Signed)
Renal diet  Activity as tolerated  Please follow up with Dr. Marius Ditch for abdominal pain.

## 2018-06-03 NOTE — Progress Notes (Signed)
[place], [place] 06/03/18  Subjective:   Patient known to our practice from outpatient dialysis.  She is admitted to the hospital for chest pain and shortness of breath. Seen during dialysis Complain of left-sided abdominal pain   HEMODIALYSIS FLOWSHEET:  Blood Flow Rate (mL/min): 350 mL/min Arterial Pressure (mmHg): -140 mmHg Venous Pressure (mmHg): 140 mmHg Transmembrane Pressure (mmHg): 70 mmHg Ultrafiltration Rate (mL/min): 630 mL/min Dialysate Flow Rate (mL/min): 600 ml/min Conductivity: Machine : 13.6 Conductivity: Machine : 13.6 Dialysis Fluid Bolus: Normal Saline Bolus Amount (mL): 250 mL    Objective:  Vital signs in last 24 hours:  Temp:  [97.5 F (36.4 C)-98.9 F (37.2 C)] 98.6 F (37 C) (12/20 1552) Pulse Rate:  [63-109] 75 (12/20 1552) Resp:  [11-17] 12 (12/20 1505) BP: (77-118)/(38-98) 103/75 (12/20 1552) SpO2:  [96 %-100 %] 98 % (12/20 1552) Weight:  [78 kg-78.9 kg] 78 kg (12/20 1505)  Weight change: 3.374 kg Filed Weights   06/02/18 1415 06/03/18 0500 06/03/18 1505  Weight: 79.9 kg 78.9 kg 78 kg    Intake/Output:    Intake/Output Summary (Last 24 hours) at 06/03/2018 1807 Last data filed at 06/03/2018 1505 Gross per 24 hour  Intake -  Output 1500 ml  Net -1500 ml     Physical Exam: General:  No acute distress, laying in the bed  HEENT  anicteric, moist oral mucous membranes  Neck  supple  Pulm/lungs  mild basilar crackles bilaterally  CVS/Heart  no rub  Abdomen:   Soft, nontender  Extremities:  Trace edema  Neurologic:  Alert, oriented  Skin:  No acute rashes  Access:  Left arm AV fistula       Basic Metabolic Panel:  Recent Labs  Lab 05/28/18 1634 06/01/18 2207 06/02/18 0305 06/02/18 0631  NA 138 141  --   --   K 3.8 3.9  --   --   CL 105 108  --   --   CO2 24 26  --   --   GLUCOSE 141* 83  --   --   BUN 38* 24*  --   --   CREATININE 6.29* 5.20*  --   --   CALCIUM 7.9* 8.2*  --   --    MG  --   --  2.2  --   PHOS  --   --  3.1 3.0     CBC: Recent Labs  Lab 05/28/18 1634 06/01/18 2207  WBC 3.6* 3.9*  NEUTROABS  --  2.3  HGB 10.3* 9.5*  HCT 33.2* 31.3*  MCV 96.8 97.8  PLT 112* 126*      Lab Results  Component Value Date   HEPBSAG Negative 06/02/2018      Microbiology:  Recent Results (from the past 240 hour(s))  MRSA PCR Screening     Status: Abnormal   Collection Time: 06/02/18 12:55 AM  Result Value Ref Range Status   MRSA by PCR POSITIVE (A) NEGATIVE Final    Comment:        The GeneXpert MRSA Assay (FDA approved for NASAL specimens only), is one component of a comprehensive MRSA colonization surveillance program. It is not intended to diagnose MRSA infection nor to guide or monitor treatment for MRSA infections. RESULT CALLED TO, READ BACK BY AND VERIFIED WITH: RN KARA @0225  06/02/18 AKT Performed at [facility], [address] [place],  [address]     Coagulation Studies: No results for input(s): LABPROT, INR in the  last 72 hours.  Urinalysis: No results for input(s): COLORURINE, LABSPEC, PHURINE, GLUCOSEU, HGBUR, BILIRUBINUR, KETONESUR, PROTEINUR, UROBILINOGEN, NITRITE, LEUKOCYTESUR in the last 72 hours.  Invalid input(s): APPERANCEUR    Imaging: Dg Chest Portable 1 View  Result Date: 06/01/2018 CLINICAL DATA:  Upper chest pain, abdominal pain and shortness of breath. Status post dialysis today. EXAM: PORTABLE CHEST 1 VIEW COMPARISON:  Chest radiograph May 28, 2018 FINDINGS: Stable cardiomegaly. Coronary artery calcification or stent. Status post median sternotomy for CABG. Mildly increased pulmonary vascular congestion and mild interstitial prominence. No pleural effusion or focal consolidation. No pneumothorax. Vascular stent LEFT arm. IMPRESSION: Stable cardiomegaly. Increasing interstitial prominence most consistent with pulmonary edema. Electronically Signed   By: Elon Alas M.D.   On: 06/01/2018  22:08     Medications:    . apixaban  5 mg Oral BID  . aspirin  81 mg Oral Daily  . carvedilol  3.125 mg Oral QHS  . Chlorhexidine Gluconate Cloth  6 each Topical Q0600  . cinacalcet  60 mg Oral QPM  . lactulose  10 g Oral BID  . levothyroxine  88 mcg Oral QHS  . mouth rinse  15 mL Mouth Rinse BID  . midodrine  10 mg Oral Daily  . multivitamin  1 tablet Oral QHS  . mupirocin ointment  1 application Nasal BID  . pantoprazole  40 mg Oral BID AC  . polyethylene glycol  17 g Oral Daily  . rosuvastatin  40 mg Oral Daily  . sacubitril-valsartan  1 tablet Oral BID  . sevelamer carbonate  1,600 mg Oral BID  . sevelamer carbonate  3,200 mg Oral TID WC  . vitamin C  250 mg Oral BID   acetaminophen, albuterol, bisacodyl, HYDROcodone-acetaminophen, HYDROmorphone (DILAUDID) injection, hydrOXYzine, ondansetron (ZOFRAN) IV, senna-docusate  Assessment/ Plan:  57 y.o.African-American female with ESRD on HD MWF, hypertension, congestive heart failure, anemia of CKD, SHPTH, myocardial infarction, hx of CVA, prior episodes of hyperkalemia, pulmonary embolism 01/2018,  ascites, nodular hepatic contour noted on CT December 2019  [place] /MWF/EDW 75.5 kg  1.  Shortness of breath from pulmonary edema 2.  End-stage renal disease 3.  Anemia of chronic kidney disease 4.  Secondary hyperparathyroidism 5.  Ascites?  Cirrhosis of the liver 6.  Cardiac.  2D echo from May 2016.  LVEF 45 to 50%.  Moderate tricuspid regurgitation  Outpatient, target weight is 75.5 kg.  Ultrafiltration as outpatient was limited because of hypotension.  Will change carvedilol to evening only to allow for higher blood pressures during the day Postdialysis weight 78 kg MiraLAX and lactulose for constipation Assess tomorrow morning   LOS: 0 Kariel Skillman 12/20/20196:07 PM  [place], [place]  Note: This note was prepared with Dragon dictation. Any  transcription errors are unintentional

## 2018-06-03 NOTE — Progress Notes (Signed)
This note also relates to the following rows which could not be included: Pulse Rate - Cannot attach notes to unvalidated device data Resp - Cannot attach notes to unvalidated device data  Hd started  

## 2018-06-03 NOTE — Progress Notes (Signed)
Advised patient of dangers of sleeping on her dialysis access arm, no change in her behavior was made.

## 2018-06-03 NOTE — Progress Notes (Signed)
This note also relates to the following rows which could not be included: Pulse Rate - Cannot attach notes to unvalidated device data Resp - Cannot attach notes to unvalidated device data  Hd completed  

## 2018-06-04 LAB — CREATININE, SERUM
CREATININE: 2.79 mg/dL — AB (ref 0.44–1.00)
GFR calc Af Amer: 21 mL/min — ABNORMAL LOW (ref >60)
GFR calc non Af Amer: 18 mL/min — ABNORMAL LOW (ref >60)

## 2018-06-04 LAB — CBC
HCT: 30.8 % — ABNORMAL LOW (ref 36.0–46.0)
Hemoglobin: 9.5 g/dL — ABNORMAL LOW (ref 12.0–15.0)
MCH: 29.7 pg (ref 26.0–34.0)
MCHC: 30.8 g/dL (ref 30.0–36.0)
MCV: 96.3 fL (ref 80.0–100.0)
Platelets: 122 10*3/uL — ABNORMAL LOW (ref 150–400)
RBC: 3.2 MIL/uL — ABNORMAL LOW (ref 3.87–5.11)
RDW: 15 % (ref 11.5–15.5)
WBC: 3.4 10*3/uL — ABNORMAL LOW (ref 4.0–10.5)
nRBC: 0 % (ref 0.0–0.2)

## 2018-06-04 MED ORDER — SENNA 8.6 MG PO TABS
1.0000 | ORAL_TABLET | Freq: Every day | ORAL | Status: DC
  Administered 2018-06-04: 8.6 mg via ORAL
  Filled 2018-06-04: qty 1

## 2018-06-04 MED ORDER — BISACODYL 5 MG PO TBEC
5.0000 mg | DELAYED_RELEASE_TABLET | Freq: Once | ORAL | Status: AC
  Administered 2018-06-04: 5 mg via ORAL
  Filled 2018-06-04: qty 1

## 2018-06-04 NOTE — Progress Notes (Signed)
Patient removed telemetry box multiple times throughout the shift. Patient states the adhesive makes her itch. Informed Central Telemetry about patient's behavior, but the technician is not able to document sed reason. Staff is unable to reapply patches and monitor within the 2 minute time limit do to the restraints of availability. Patient is alert and oriented. Patient received atarax for itching this shift. Patient has been educated about the cardiac monitoring. Will continue to monitor and assess.

## 2018-06-04 NOTE — Progress Notes (Signed)
[facility], [place] 06/04/18  Subjective:   Still no bowel movement this morning.  Abdominal pain is slightly better but controlled with medications.  Patient is requesting another treatment of dialysis and then discharge.  Received message from nurse during dialysis that patient wants to stay only for 2 hours.   Objective:  Vital signs in last 24 hours:  Temp:  [97.5 F (36.4 C)-98.7 F (37.1 C)] 98.1 F (36.7 C) (12/21 0822) Pulse Rate:  [63-109] 68 (12/21 0822) Resp:  [11-15] 12 (12/20 1505) BP: (77-173)/(38-158) 94/60 (12/21 0822) SpO2:  [95 %-100 %] 97 % (12/21 0822) Weight:  [78 kg-78.8 kg] 78.8 kg (12/21 0458)  Weight change: -4.3 kg Filed Weights   06/03/18 0500 06/03/18 1505 06/04/18 0458  Weight: 78.9 kg 78 kg 78.8 kg    Intake/Output:    Intake/Output Summary (Last 24 hours) at 06/04/2018 0915 Last data filed at 06/04/2018 0212 Gross per 24 hour  Intake -  Output 1500 ml  Net -1500 ml     Physical Exam: General:  No acute distress, laying in the bed  HEENT  anicteric, moist oral mucous membranes  Neck  supple  Pulm/lungs  mild basilar crackles bilaterally  CVS/Heart  no rub  Abdomen:   Soft, nontender  Extremities:  Trace edema  Neurologic:  Alert, oriented  Skin:  No acute rashes  Access:  Left arm AV fistula       Basic Metabolic Panel:  Recent Labs  Lab 05/28/18 1634 06/01/18 2207 06/02/18 0305 06/02/18 0631  NA 138 141  --   --   K 3.8 3.9  --   --   CL 105 108  --   --   CO2 24 26  --   --   GLUCOSE 141* 83  --   --   BUN 38* 24*  --   --   CREATININE 6.29* 5.20*  --   --   CALCIUM 7.9* 8.2*  --   --   MG  --   --  2.2  --   PHOS  --   --  3.1 3.0     CBC: Recent Labs  Lab 05/28/18 1634 06/01/18 2207  WBC 3.6* 3.9*  NEUTROABS  --  2.3  HGB 10.3* 9.5*  HCT 33.2* 31.3*  MCV 96.8 97.8  PLT 112* 126*      Lab Results  Component Value Date   HEPBSAG Negative 06/02/2018       Microbiology:  Recent Results (from the past 240 hour(s))  MRSA PCR Screening     Status: Abnormal   Collection Time: 06/02/18 12:55 AM  Result Value Ref Range Status   MRSA by PCR POSITIVE (A) NEGATIVE Final    Comment:        The GeneXpert MRSA Assay (FDA approved for NASAL specimens only), is one component of a comprehensive MRSA colonization surveillance program. It is not intended to diagnose MRSA infection nor to guide or monitor treatment for MRSA infections. RESULT CALLED TO, READ BACK BY AND VERIFIED WITH: RN KARA @0225  06/02/18 AKT Performed at [facility], [address] [place], [place] [address]     Coagulation Studies: No results for input(s): LABPROT, INR in the last 72 hours.  Urinalysis: No results for input(s): COLORURINE, LABSPEC, PHURINE, GLUCOSEU, HGBUR, BILIRUBINUR, KETONESUR, PROTEINUR, UROBILINOGEN, NITRITE, LEUKOCYTESUR in the last 72 hours.  Invalid input(s): APPERANCEUR    Imaging: No results found.   Medications:    .  apixaban  5 mg Oral BID  . aspirin  81 mg Oral Daily  . carvedilol  3.125 mg Oral QHS  . Chlorhexidine Gluconate Cloth  6 each Topical Q0600  . cinacalcet  60 mg Oral QPM  . lactulose  10 g Oral BID  . levothyroxine  88 mcg Oral QHS  . mouth rinse  15 mL Mouth Rinse BID  . midodrine  10 mg Oral Daily  . multivitamin  1 tablet Oral QHS  . mupirocin ointment  1 application Nasal BID  . pantoprazole  40 mg Oral BID AC  . polyethylene glycol  17 g Oral Daily  . rosuvastatin  40 mg Oral Daily  . sacubitril-valsartan  1 tablet Oral BID  . sevelamer carbonate  1,600 mg Oral BID  . sevelamer carbonate  3,200 mg Oral TID WC  . sodium chloride flush  10 mL Intravenous Q12H  . vitamin C  250 mg Oral BID   acetaminophen, albuterol, bisacodyl, HYDROcodone-acetaminophen, HYDROmorphone (DILAUDID) injection, hydrOXYzine, ondansetron (ZOFRAN) IV, senna-docusate  Assessment/ Plan:  57 y.o.African-American  female with ESRD on HD MWF, hypertension, congestive heart failure, anemia of CKD, SHPTH, myocardial infarction, hx of CVA, prior episodes of hyperkalemia, pulmonary embolism 01/2018,  ascites, nodular hepatic contour noted on CT December 2019  [place] /MWF/EDW 75.5 kg  1.  Shortness of breath from pulmonary edema 2.  End-stage renal disease 3.  Anemia of chronic kidney disease 4.  Secondary hyperparathyroidism 5.  Ascites?  Cirrhosis of the liver 6.  Cardiac.  2D echo from May 2016.  LVEF 45 to 50%.  Moderate tricuspid regurgitation  Outpatient, target weight is 75.5 kg.  Ultrafiltration as outpatient was limited because of hypotension.  Will change carvedilol to evening only to allow for higher blood pressures during the day Postdialysis weight 78 kg Extra hemodialysis today for 1.5 to 2 kg fluid removal. MiraLAX and lactulose for constipation    LOS: 1 [name] 12/21/20199:15 AM  [place], [place]  Note: This note was prepared with Dragon dictation. Any transcription errors are unintentional

## 2018-06-04 NOTE — Progress Notes (Signed)
This note also relates to the following rows which could not be included: Pulse Rate - Cannot attach notes to unvalidated device data  Hd started

## 2018-06-04 NOTE — Progress Notes (Signed)
Postdialysis weight 77.6 kg We will change outpatient dry weight to 78 kg.

## 2018-06-04 NOTE — Progress Notes (Signed)
Patient discharged to home.  Tele and IV d/c'd prior to discharge.  Patient verbalizes understanding of d/c orders.

## 2018-06-04 NOTE — Progress Notes (Signed)
This note also relates to the following rows which could not be included: Pulse Rate - Cannot attach notes to unvalidated device data  Hd competed

## 2018-06-20 ENCOUNTER — Ambulatory Visit: Payer: Medicare Other | Admitting: Family

## 2018-06-20 NOTE — Discharge Summary (Signed)
[place] [facility] [place] NAME: [name]    MR#:  [id number]  DATE OF BIRTH:  Oct 18, 1960  DATE OF ADMISSION:  06/01/2018 ADMITTING PHYSICIAN: Arta Silence, MD  DATE OF DISCHARGE: 06/04/2018  6:39 PM  PRIMARY CARE PHYSICIAN: [name], MD    ADMISSION DIAGNOSIS:  Nonspecific chest pain [R07.9]  DISCHARGE DIAGNOSIS:  Active Problems:   Chest pain   SECONDARY DIAGNOSIS:   Past Medical History:  Diagnosis Date  . CHF (congestive heart failure) ([place])   . Heart failure ([place])   . Hyperlipidemia   . Hypertension   . Myocardial infarction ([place])   . Paroxysmal atrial fibrillation ([facility])   . Renal failure   . Renal insufficiency   . Stroke [facility]) 2011    HOSPITAL COURSE:   * Acute on chronic systolic chf likely due to non complaince Cardiomyopathy Life vest in place Improving with ongoing dialysis.  *End-stage renal disease on hemodialysis Dialysis yesterday and today. Discussed with nephrology Dr. Candiss Norse.  *Hepatitis C with cirrhosis Likely cause of abdominal pain.  Mild ascites on CT scan 1 week back.  Needs outpatient GI follow-up.  Abdominal pain contributed by constipation.  Lactulose ordered by nephrology.   DISCHARGE CONDITIONS:   Stable.  CONSULTS OBTAINED:  Treatment Team:  Arta Silence, MD Murlean Iba, MD  DRUG ALLERGIES:   Allergies  Allergen Reactions  . Morphine And Related Hives  . Levaquin [Levofloxacin] Itching    Severe itching; prickly sensation  . Latex Rash  . Tape Rash    DISCHARGE MEDICATIONS:   Allergies as of 06/04/2018      Reactions   Morphine And Related Hives   Levaquin [levofloxacin] Itching   Severe itching; prickly sensation   Latex Rash   Tape Rash      Medication List    STOP taking these medications   HYDROcodone-acetaminophen 5-325 MG tablet Commonly known as:  NORCO     TAKE these medications   albuterol 108 (90 Base) MCG/ACT  inhaler Commonly known as:  PROVENTIL HFA;VENTOLIN HFA Inhale 2 puffs into the lungs every 4 (four) hours as needed for wheezing or shortness of breath.   aspirin 81 MG chewable tablet Chew 81 mg by mouth daily.   carvedilol 12.5 MG tablet Commonly known as:  COREG Take 12.5 mg by mouth 2 (two) times daily with a meal.   ELIQUIS 5 MG Tabs tablet Generic drug:  apixaban Take 1 tablet (5 mg total) by mouth 2 (two) times daily.   ENTRESTO 49-51 MG Generic drug:  sacubitril-valsartan Take 1 tablet by mouth 2 (two) times daily.   hydrOXYzine 50 MG tablet Commonly known as:  ATARAX/VISTARIL Take 50 mg by mouth 2 (two) times daily as needed for itching.   levothyroxine 88 MCG tablet Commonly known as:  SYNTHROID, LEVOTHROID Take 88 mcg by mouth at bedtime.   midodrine 10 MG tablet Commonly known as:  PROAMATINE Take 10-20 mg by mouth daily. (for low BP associated with dialysis)   pantoprazole 40 MG tablet Commonly known as:  PROTONIX Take 1 tablet by mouth at bedtime.   rosuvastatin 40 MG tablet Commonly known as:  CRESTOR Take 40 mg by mouth daily.   SENSIPAR 60 MG tablet Generic drug:  cinacalcet Take 60 mg by mouth every evening.   sevelamer carbonate 800 MG tablet Commonly known as:  RENVELA Take 4 tablets (3200MG ) by mouth 3 times daily with meals and 2 tablets (1600MG ) by mouth  twice daily with snacks        DISCHARGE INSTRUCTIONS:    Follow with PMD in 1-02 weeks.  If you experience worsening of your admission symptoms, develop shortness of breath, life threatening emergency, suicidal or homicidal thoughts you must seek medical attention immediately by calling 911 or calling your MD immediately  if symptoms less severe.  You Must read complete instructions/literature along with all the possible adverse reactions/side effects for all the Medicines you take and that have been prescribed to you. Take any new Medicines after you have completely understood and  accept all the possible adverse reactions/side effects.   Please note  You were cared for by a hospitalist during your hospital stay. If you have any questions about your discharge medications or the care you received while you were in the hospital after you are discharged, you can call the unit and asked to speak with the hospitalist on call if the hospitalist that took care of you is not available. Once you are discharged, your primary care physician will handle any further medical issues. Please note that NO REFILLS for any discharge medications will be authorized once you are discharged, as it is imperative that you return to your primary care physician (or establish a relationship with a primary care physician if you do not have one) for your aftercare needs so that they can reassess your need for medications and monitor your lab values.    Today   CHIEF COMPLAINT:   Chief Complaint  Patient presents with  . Chest Pain    HISTORY OF PRESENT ILLNESS:  [name]  is a 58 y.o. female with a known history of CAD/MI (CABG x3), chronic systolic CHF/ICM (w/ Lifevest, EF 19% as of 04/06/2018), Afib (Eliquis), CVA, ESRD (HD M/W/F) p/w CP/SOB, volume overload. She is AAOx3, but is a poor historian. Had HD on M + W, (-) UF (2/2 hypotension). Endorses CP/SOB chronic, progressively worsening x3-4d. Endorses edema. Also c/o LLQ AP, sharp, random, severe, but she appears comfortable; BM WNL, (-) blood, CT A/P 05/28/2018 (-). Anuric. Says her Cardiologist is [employer].   VITAL SIGNS:  Blood pressure 128/79, pulse 70, temperature 98.3 F (36.8 C), temperature source Oral, resp. rate 20, height 5\' 6"  (1.676 m), weight 77.6 kg, SpO2 92 %.  I/O:  No intake or output data in the 24 hours ending 06/20/18 1627  PHYSICAL EXAMINATION:   GENERAL:  58 y.o.-year-old patient lying in the bed with no acute distress.  EYES: Pupils equal, round, reactive to light and accommodation. No scleral icterus.  Extraocular muscles intact.  HEENT: Head atraumatic, normocephalic. Oropharynx and nasopharynx clear.  NECK:  Supple, no jugular venous distention. No thyroid enlargement, no tenderness.  LUNGS: Normal breath sounds bilaterally, no wheezing, rales, rhonchi. No use of accessory muscles of respiration.  CARDIOVASCULAR: S1, S2 normal. No murmurs, rubs, or gallops.  ABDOMEN: Soft, nontender, nondistended. Bowel sounds present. No organomegaly or mass.  EXTREMITIES: No cyanosis, clubbing or edema b/l.    NEUROLOGIC: Cranial nerves II through XII are intact. No focal Motor or sensory deficits b/l.   PSYCHIATRIC: The patient is alert and oriented x 3.  SKIN: No obvious rash, lesion, or ulcer.   DATA REVIEW:   CBC No results for input(s): WBC, HGB, HCT, PLT in the last 168 hours.  Chemistries  No results for input(s): NA, K, CL, CO2, GLUCOSE, BUN, CREATININE, CALCIUM, MG, AST, ALT, ALKPHOS, BILITOT in the last 168 hours.  Invalid input(s): GFRCGP  Cardiac Enzymes No results for input(s): TROPONINI in the last 168 hours.  Microbiology Results  Results for orders placed or performed during the hospital encounter of 06/01/18  MRSA PCR Screening     Status: Abnormal   Collection Time: 06/02/18 12:55 AM  Result Value Ref Range Status   MRSA by PCR POSITIVE (A) NEGATIVE Final    Comment:        The GeneXpert MRSA Assay (FDA approved for NASAL specimens only), is one component of a comprehensive MRSA colonization surveillance program. It is not intended to diagnose MRSA infection nor to guide or monitor treatment for MRSA infections. RESULT CALLED TO, READ BACK BY AND VERIFIED WITH: RN KARA @0225  06/02/18 AKT Performed at [facility], [address] [place], [place] [address]     RADIOLOGY:  No results found.  EKG:   Orders placed or performed during the hospital encounter of 06/01/18  . EKG 12-Lead  . EKG 12-Lead  . ED EKG  . ED EKG      Management plans  discussed with the patient, family and they are in agreement.  CODE STATUS:  Code Status History    Date Active Date Inactive Code Status Order ID Comments User Context   06/02/2018 0039 06/04/2018 2144 Full Code [id number]  Arta Silence, MD Inpatient   04/21/2018 1808 04/22/2018 2210 Full Code [id number]  Dustin Flock, MD Inpatient   01/31/2018 1924 02/03/2018 0111 Full Code [id number]  Loletha Grayer, MD ED   11/13/2014 1659 11/15/2014 2152 Full Code [id number]  Hillary Bow, MD ED   10/16/2014 0823 10/17/2014 2333 Full Code [id number]  Lance Coon, MD Inpatient      TOTAL TIME TAKING CARE OF THIS PATIENT: 35 minutes.    Vaughan Basta M.D on 06/20/2018 at 4:27 PM  Between 7am to 6pm - Pager - [phone]  After 6pm go to [email or web address] - password [email or web address] [facility]  Office  [phone]  CC: Primary care physician; [name], MD   Note: This dictation was prepared with Dragon dictation along with smaller phrase technology. Any transcriptional errors that result from this process are unintentional.

## 2018-08-05 ENCOUNTER — Emergency Department: Payer: Medicare Other

## 2018-08-05 ENCOUNTER — Inpatient Hospital Stay
Admission: EM | Admit: 2018-08-05 | Discharge: 2018-08-09 | DRG: 280 | Disposition: A | Discharge status: Home / Self Care | Payer: Medicare Other | Attending: Internal Medicine | Admitting: Internal Medicine

## 2018-08-05 ENCOUNTER — Other Ambulatory Visit: Payer: Self-pay

## 2018-08-05 DIAGNOSIS — Z7901 Long term (current) use of anticoagulants: Secondary | ICD-10-CM

## 2018-08-05 DIAGNOSIS — I214 Non-ST elevation (NSTEMI) myocardial infarction: Principal | ICD-10-CM | POA: Diagnosis present

## 2018-08-05 DIAGNOSIS — Z79899 Other long term (current) drug therapy: Secondary | ICD-10-CM

## 2018-08-05 DIAGNOSIS — E0590 Thyrotoxicosis, unspecified without thyrotoxic crisis or storm: Secondary | ICD-10-CM | POA: Diagnosis present

## 2018-08-05 DIAGNOSIS — Z841 Family history of disorders of kidney and ureter: Secondary | ICD-10-CM

## 2018-08-05 DIAGNOSIS — F17210 Nicotine dependence, cigarettes, uncomplicated: Secondary | ICD-10-CM | POA: Diagnosis present

## 2018-08-05 DIAGNOSIS — Z8673 Personal history of transient ischemic attack (TIA), and cerebral infarction without residual deficits: Secondary | ICD-10-CM

## 2018-08-05 DIAGNOSIS — N186 End stage renal disease: Secondary | ICD-10-CM

## 2018-08-05 DIAGNOSIS — Z91040 Latex allergy status: Secondary | ICD-10-CM

## 2018-08-05 DIAGNOSIS — Z951 Presence of aortocoronary bypass graft: Secondary | ICD-10-CM

## 2018-08-05 DIAGNOSIS — W19XXXA Unspecified fall, initial encounter: Secondary | ICD-10-CM

## 2018-08-05 DIAGNOSIS — Z91048 Other nonmedicinal substance allergy status: Secondary | ICD-10-CM

## 2018-08-05 DIAGNOSIS — N2581 Secondary hyperparathyroidism of renal origin: Secondary | ICD-10-CM | POA: Diagnosis present

## 2018-08-05 DIAGNOSIS — R42 Dizziness and giddiness: Secondary | ICD-10-CM | POA: Diagnosis not present

## 2018-08-05 DIAGNOSIS — D631 Anemia in chronic kidney disease: Secondary | ICD-10-CM | POA: Diagnosis present

## 2018-08-05 DIAGNOSIS — R410 Disorientation, unspecified: Secondary | ICD-10-CM | POA: Diagnosis not present

## 2018-08-05 DIAGNOSIS — I480 Paroxysmal atrial fibrillation: Secondary | ICD-10-CM | POA: Diagnosis present

## 2018-08-05 DIAGNOSIS — I252 Old myocardial infarction: Secondary | ICD-10-CM

## 2018-08-05 DIAGNOSIS — Z955 Presence of coronary angioplasty implant and graft: Secondary | ICD-10-CM

## 2018-08-05 DIAGNOSIS — T148XXA Other injury of unspecified body region, initial encounter: Secondary | ICD-10-CM

## 2018-08-05 DIAGNOSIS — Y92009 Unspecified place in unspecified non-institutional (private) residence as the place of occurrence of the external cause: Secondary | ICD-10-CM

## 2018-08-05 DIAGNOSIS — S8012XA Contusion of left lower leg, initial encounter: Secondary | ICD-10-CM | POA: Diagnosis present

## 2018-08-05 DIAGNOSIS — E785 Hyperlipidemia, unspecified: Secondary | ICD-10-CM | POA: Diagnosis present

## 2018-08-05 DIAGNOSIS — I132 Hypertensive heart and chronic kidney disease with heart failure and with stage 5 chronic kidney disease, or end stage renal disease: Secondary | ICD-10-CM | POA: Diagnosis present

## 2018-08-05 DIAGNOSIS — R7989 Other specified abnormal findings of blood chemistry: Secondary | ICD-10-CM

## 2018-08-05 DIAGNOSIS — Z79890 Hormone replacement therapy (postmenopausal): Secondary | ICD-10-CM

## 2018-08-05 DIAGNOSIS — R778 Other specified abnormalities of plasma proteins: Secondary | ICD-10-CM

## 2018-08-05 DIAGNOSIS — K219 Gastro-esophageal reflux disease without esophagitis: Secondary | ICD-10-CM | POA: Diagnosis present

## 2018-08-05 DIAGNOSIS — Z7982 Long term (current) use of aspirin: Secondary | ICD-10-CM

## 2018-08-05 DIAGNOSIS — I5022 Chronic systolic (congestive) heart failure: Secondary | ICD-10-CM | POA: Diagnosis present

## 2018-08-05 DIAGNOSIS — R079 Chest pain, unspecified: Secondary | ICD-10-CM

## 2018-08-05 DIAGNOSIS — K7460 Unspecified cirrhosis of liver: Secondary | ICD-10-CM | POA: Diagnosis present

## 2018-08-05 DIAGNOSIS — R34 Anuria and oliguria: Secondary | ICD-10-CM | POA: Diagnosis present

## 2018-08-05 DIAGNOSIS — Z992 Dependence on renal dialysis: Secondary | ICD-10-CM

## 2018-08-05 DIAGNOSIS — Z885 Allergy status to narcotic agent status: Secondary | ICD-10-CM

## 2018-08-05 DIAGNOSIS — I25110 Atherosclerotic heart disease of native coronary artery with unstable angina pectoris: Secondary | ICD-10-CM | POA: Diagnosis present

## 2018-08-05 LAB — CBC
HCT: 36.8 % (ref 36.0–46.0)
Hemoglobin: 11.4 g/dL — ABNORMAL LOW (ref 12.0–15.0)
MCH: 31.8 pg (ref 26.0–34.0)
MCHC: 31 g/dL (ref 30.0–36.0)
MCV: 102.5 fL — ABNORMAL HIGH (ref 80.0–100.0)
Platelets: 184 10*3/uL (ref 150–400)
RBC: 3.59 MIL/uL — ABNORMAL LOW (ref 3.87–5.11)
RDW: 17.2 % — ABNORMAL HIGH (ref 11.5–15.5)
WBC: 3.9 10*3/uL — ABNORMAL LOW (ref 4.0–10.5)
nRBC: 0 % (ref 0.0–0.2)

## 2018-08-05 MED ORDER — HYDROMORPHONE HCL 1 MG/ML IJ SOLN
0.5000 mg | Freq: Once | INTRAMUSCULAR | Status: AC
  Administered 2018-08-06: 0.5 mg via INTRAVENOUS
  Filled 2018-08-05: qty 1

## 2018-08-05 NOTE — ED Triage Notes (Signed)
Pt to ED via EMS from home c/o dizziness and fall today.  States had full dialysis treatment today, started having left chest and arm pain when she got home and then while walking became dizzy and fell.  Pt has hematoma to left lower leg.  Denies n/v, diarrhea today. Uses Eliquis.  Presents A&Ox4.

## 2018-08-05 NOTE — ED Provider Notes (Signed)
[facility] Emergency Department Provider Note   ____________________________________________   None    (approximate)  I have reviewed the triage vital signs and the nursing notes.   HISTORY  Chief Complaint Fall and Dizziness    HPI [name] is a 58 y.o. female brought to the ED via EMS from home with a chief complaint of dizziness and fall.  Patient has a history of ESRD on HD M/W/F, CHF on continuous oxygenation,  pretension, paroxysmal A. fib on Eliquis, CVA.  States she had her full dialysis session this afternoon.  Subsequently became lightheaded and dizzy which caused her to fall, striking her left shin.  Presents with hematoma to her left lower leg.  Later in the evening patient had onset of sharp chest pain which is exacerbated with movement and palpation.  States this is a similar presentation to her prior MI.  Denies recent fever, chills, shortness of breath, abdominal pain, nausea, vomiting, diarrhea.  Patient is anuric.  Denies recent travel.   Past Medical History:  Diagnosis Date  . CHF (congestive heart failure) ([place])   . Heart failure ([place])   . Hyperlipidemia   . Hypertension   . Myocardial infarction ([place])   . Paroxysmal atrial fibrillation (HCC)   . Renal failure   . Renal insufficiency   . Stroke [facility]) 2011    Patient Active Problem List   Diagnosis Date Noted  . Pneumonia 01/31/2018  . Swelling of limb 09/22/2016  . Kidney dialysis as the cause of abnormal reaction of the patient, or of later complication, without mention of misadventure at the time of the procedure (CODE) 03/24/2016  . Hyperkalemia 11/13/2014  . Generalized weakness 10/15/2014  . Chest pain 10/15/2014  . ESRD on hemodialysis ([place]) 10/15/2014  . Congestive heart failure (CHF) ([place]) 10/15/2014  . HTN (hypertension) 10/15/2014  . Sternal wound dehiscence 04/12/2014  . 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 Pericardial effusion 04/12/2014  .  History of delirium 04/12/2014  . H/O atrial flutter 04/12/2014  . Delayed surgical wound healing 04/12/2014  . Chest wall pain following surgery 04/12/2014  . S/P CABG (coronary artery bypass graft) 04/02/2014  . Lactic acidosis 03/26/2014  . Arteriosclerotic dementia ([place]) 03/25/2014  . Atrial flutter by electrocardiogram ([place]) 03/22/2014  . Superficial incisional surgical site infection 03/14/2014  . Postoperative anemia due to acute blood loss 02/27/2014  . Obesity 02/27/2014  . H/O: substance abuse ([place]) 02/27/2014  . Anemia of chronic renal failure 02/27/2014  . Acute postoperative pain 02/27/2014  . Leukocytosis 02/26/2014  . Hypotension 02/24/2014  . Exhausted vascular access 02/24/2014  . Anemia of chronic disease 02/24/2014  . Stroke ([place]) 02/21/2014  . S/P CABG x 3 02/21/2014  . Dialysis patient ([place]) 02/21/2014  . Hyperlipidemia 02/13/2014  . History of hepatitis C 02/13/2014  . HFrEF (heart failure with reduced ejection fraction) ([place]) 02/13/2014  . H/O stroke without residual deficits 02/13/2014  . ESRD (end stage renal disease) on dialysis ([place]) 02/13/2014  . End stage renal disease with hypertension ([place]) 02/13/2014  . CAD, multiple vessel 02/13/2014  . Retrosternal chest pain 05/30/2013  . Gallstones 05/29/2013    Past Surgical History:  Procedure Laterality Date  . CORONARY ANGIOPLASTY WITH STENT PLACEMENT  2013  . CORONARY ARTERY BYPASS GRAFT    . DIALYSIS FISTULA CREATION      Prior to Admission medications   Medication Sig Start Date End Date Taking? Authorizing Provider  albuterol (PROVENTIL HFA;VENTOLIN HFA) 108 (90 Base)  MCG/ACT inhaler Inhale 2 puffs into the lungs every 4 (four) hours as needed for wheezing or shortness of breath.    [provider]  aspirin 81 MG chewable tablet Chew 81 mg by mouth daily.    [provider]  carvedilol (COREG) 12.5 MG tablet Take 12.5 mg by mouth 2 (two) times daily with a meal.     [provider]  cinacalcet (SENSIPAR) 60 MG tablet Take 60 mg by mouth every evening.     [provider]  ELIQUIS 5 MG TABS tablet Take 1 tablet (5 mg total) by mouth 2 (two) times daily. 02/02/18   Mayo, Pete Pelt, MD  hydrOXYzine (ATARAX/VISTARIL) 50 MG tablet Take 50 mg by mouth 2 (two) times daily as needed for itching.     [provider]  levothyroxine (SYNTHROID, LEVOTHROID) 88 MCG tablet Take 88 mcg by mouth at bedtime.  03/03/18   [provider]  midodrine (PROAMATINE) 10 MG tablet Take 10-20 mg by mouth daily. (for low BP associated with dialysis)    [provider]  pantoprazole (PROTONIX) 40 MG tablet Take 1 tablet by mouth at bedtime.  05/07/13   [provider]  rosuvastatin (CRESTOR) 40 MG tablet Take 40 mg by mouth daily.    [provider]  sacubitril-valsartan (ENTRESTO) 49-51 MG Take 1 tablet by mouth 2 (two) times daily.     [provider]  sevelamer carbonate (RENVELA) 800 MG tablet Take 4 tablets (3200MG ) by mouth 3 times daily with meals and 2 tablets (1600MG ) by mouth twice daily with snacks    [provider]    Allergies Morphine and related; Levaquin [levofloxacin]; Latex; and Tape  Family History  Problem Relation Age of Onset  . Hypertension Other   . Cancer Other   . Renal Disease Other   . Ovarian cancer Mother   . Kidney disease Father     Social History Social History   Tobacco Use  . Smoking status: Current Every Day Smoker    Packs/day: 0.10    Years: 20.00    Pack years: 2.00    Types: Cigarettes  . Smokeless tobacco: Never Used  Substance Use Topics  . Alcohol use: No  . Drug use: No    Review of Systems  Constitutional: No fever/chills Eyes: No visual changes. ENT: No sore throat. Cardiovascular: Positive for chest pain. Respiratory: Denies shortness of breath. Gastrointestinal: No abdominal pain.  No nausea, no vomiting.  No diarrhea.  No  constipation. Genitourinary: Negative for dysuria. Musculoskeletal: Positive for left shin pain.  Negative for back pain. Skin: Negative for rash. Neurological: Negative for headaches, focal weakness or numbness.   ____________________________________________   PHYSICAL EXAM:  VITAL SIGNS: ED Triage Vitals  Enc Vitals Group     BP 08/05/18 2310 115/78     Pulse Rate 08/05/18 2310 85     Resp 08/05/18 2310 19     Temp 08/05/18 2310 98 F (36.7 C)     Temp Source 08/05/18 2310 Oral     SpO2 08/05/18 2310 98 %     Weight 08/05/18 2309 168 lb (76.2 kg)     Height 08/05/18 2309 5\' 6"  (1.676 m)     Head Circumference --      Peak Flow --      Pain Score 08/05/18 2309 9     Pain Loc --      Pain Edu? --      Excl. in [place]? --  Constitutional: Alert and oriented. Well appearing and in mild acute distress. Eyes: Conjunctivae are normal. PERRL. EOMI. Head: Atraumatic. Nose: Atraumatic. Mouth/Throat: Mucous membranes are moist.  No dental malocclusion. Neck: No stridor.  No cervical spine tenderness to palpation. Cardiovascular: Normal rate, regular rhythm. Grossly normal heart sounds.  Good peripheral circulation. Respiratory: Normal respiratory effort.  No retractions. Lungs CTAB.  Left anterior chest tender to palpation and with movement of trunk. Gastrointestinal: Soft and nontender. No distention. No abdominal bruits. No CVA tenderness. Musculoskeletal: Small hematoma noted to left anterior mid tibia.  No pedal edema.  No joint effusions. Neurologic:  Normal speech and language. No gross focal neurologic deficits are appreciated.  Skin:  Skin is warm, dry and intact. No rash noted. Psychiatric: Mood and affect are normal. Speech and behavior are normal.  ____________________________________________   LABS (all labs ordered are listed, but only abnormal results are displayed)  Labs Reviewed  BASIC METABOLIC PANEL - Abnormal; Notable for the following components:       Result Value   Creatinine, Ser 4.24 (*)    Calcium 8.1 (*)    GFR calc non Af Amer 11 (*)    GFR calc Af Amer 13 (*)    All other components within normal limits  CBC - Abnormal; Notable for the following components:   WBC 3.9 (*)    RBC 3.59 (*)    Hemoglobin 11.4 (*)    MCV 102.5 (*)    RDW 17.2 (*)    All other components within normal limits  TROPONIN I - Abnormal; Notable for the following components:   Troponin I 0.06 (*)    All other components within normal limits  URINALYSIS, COMPLETE (UACMP) WITH MICROSCOPIC  CBG MONITORING, ED   ____________________________________________  EKG  ED ECG REPORT I, Rosana Farnell J, the attending physician, personally viewed and interpreted this ECG.   Date: 08/05/2018  EKG Time: 2314  Rate: 83  Rhythm: normal EKG, normal sinus rhythm  Axis: Normal  Intervals:right bundle branch block  ST&T Change: Nonspecific  ____________________________________________  RADIOLOGY  ED MD interpretation: No ICH, stable cardiomegaly, no acute fracture/dislocation  Official radiology report(s): Dg Chest 1 View  Result Date: 08/06/2018 CLINICAL DATA:  58 y/o  F; chest pain after dialysis. EXAM: CHEST  1 VIEW COMPARISON:  06/01/2018 chest radiograph FINDINGS: Stable cardiomegaly given projection and technique. Post median sternotomy with wires in alignment. Diffuse reticular opacities compatible with interstitial pulmonary edema. No focal consolidation. No pleural effusion or pneumothorax. No acute osseous abnormality is evident. IMPRESSION: Interstitial pulmonary edema. Stable cardiomegaly. Electronically Signed   By: Kristine Garbe M.D.   On: 08/06/2018 00:15   Dg Tibia/fibula Left  Result Date: 08/05/2018 CLINICAL DATA:  58 year old female with fall and trauma to the left lower extremity. EXAM: LEFT TIBIA AND FIBULA - 2 VIEW COMPARISON:  Left lower extremity radiograph dated 07/18/2015 FINDINGS: There is no acute fracture or dislocation.  The bones are well mineralized. No arthritic changes. Vascular calcifications noted. There is a focal area of skin contusion or hematoma along the anterior shin. Surgical clips noted in the soft tissues of the calf. IMPRESSION: No acute fracture or dislocation. Electronically Signed   By: Anner Crete M.D.   On: 08/05/2018 23:52   Ct Head Wo Contrast  Result Date: 08/05/2018 CLINICAL DATA:  58 year old female with dizziness and fall. EXAM: CT HEAD WITHOUT CONTRAST TECHNIQUE: Contiguous axial images were obtained from the base of the skull through the vertex without intravenous contrast. COMPARISON:  Head CT dated 01/13/2016 and brain MRI dated 06/24/2014 FINDINGS: Brain: Scattered areas of old infarcts similar to prior CT, possibly sequela of embolic process. No acute intracranial hemorrhage. No mass effect or midline shift. No extra-axial fluid collection. Vascular: No hyperdense vessel or unexpected calcification. Skull: Normal. Negative for fracture or focal lesion. Sinuses/Orbits: No acute finding. Other: None IMPRESSION: 1. No acute intracranial hemorrhage. 2. Scattered areas of old infarcts similar to prior CT, possibly sequela of embolic process. Electronically Signed   By: Anner Crete M.D.   On: 08/05/2018 23:56    ____________________________________________   PROCEDURES  Procedure(s) performed: None  Procedures  Critical Care performed:   CRITICAL CARE Performed by: Paulette Blanch   Total critical care time: 30 minutes  Critical care time was exclusive of separately billable procedures and treating other patients.  Critical care was necessary to treat or prevent imminent or life-threatening deterioration.  Critical care was time spent personally by me on the following activities: development of treatment plan with patient and/or surrogate as well as nursing, discussions with consultants, evaluation of patient's response to treatment, examination of patient, obtaining  history from patient or surrogate, ordering and performing treatments and interventions, ordering and review of laboratory studies, ordering and review of radiographic studies, pulse oximetry and re-evaluation of patient's condition.  ____________________________________________   INITIAL IMPRESSION / ASSESSMENT AND PLAN / ED COURSE  As part of my medical decision making, I reviewed the following data within the [place] notes reviewed and incorporated, Labs reviewed, EKG interpreted, Old chart reviewed, Radiograph reviewed and Notes from prior ED visits    58 year old female who presents with dizziness and fall status post dialysis. Differential diagnosis includes, but is not limited to, ACS, aortic dissection, pulmonary embolism, cardiac tamponade, pneumothorax, pneumonia, pericarditis, myocarditis, GI-related causes including esophagitis/gastritis, and musculoskeletal chest wall pain.    Will obtain cardiac work-up, CT head, image to left shin.  Patient tells me she has had Dilaudid previously without adverse reaction.  Will reassess.   Clinical Course as of Aug 06 30  Sat Aug 06, 2018  0031 Updated patient on all test results.  Aspirin ordered for troponin elevated slightly more than baseline. Will discuss with hospitalist Dr. Jannifer Franklin to evaluate patient in the emergency department for admission.   [JS]    Clinical Course User Index [JS] Paulette Blanch, MD     ____________________________________________   FINAL CLINICAL IMPRESSION(S) / ED DIAGNOSES  Final diagnoses:  Lightheadedness  Dizziness  Fall, initial encounter  Chest pain, unspecified type  Elevated troponin  Hematoma  Stage 5 chronic kidney disease on chronic dialysis [facility])     ED Discharge Orders    None       Note:  This document was prepared using Dragon voice recognition software and may include unintentional dictation errors.   Paulette Blanch, MD 08/06/18 [phone]

## 2018-08-06 ENCOUNTER — Other Ambulatory Visit: Payer: Self-pay

## 2018-08-06 LAB — TROPONIN I
Troponin I: 0.06 ng/mL (ref <0.03)
Troponin I: 0.06 ng/mL (ref <0.03)
Troponin I: 0.06 ng/mL (ref <0.03)
Troponin I: 0.05 ng/mL (ref <0.03)

## 2018-08-06 LAB — MRSA PCR SCREENING: MRSA by PCR: POSITIVE — AB

## 2018-08-06 LAB — PROTIME-INR
INR: 1.52
Prothrombin Time: 18.1 seconds — ABNORMAL HIGH (ref 11.4–15.2)

## 2018-08-06 LAB — BASIC METABOLIC PANEL
Anion gap: 7 (ref 5–15)
BUN: 17 mg/dL (ref 6–20)
CO2: 30 mmol/L (ref 22–32)
Calcium: 8.1 mg/dL — ABNORMAL LOW (ref 8.9–10.3)
Chloride: 104 mmol/L (ref 98–111)
Creatinine, Ser: 4.24 mg/dL — ABNORMAL HIGH (ref 0.44–1.00)
GFR calc Af Amer: 13 mL/min — ABNORMAL LOW (ref >60)
GFR, EST NON AFRICAN AMERICAN: 11 mL/min — AB (ref >60)
Glucose, Bld: 92 mg/dL (ref 70–99)
POTASSIUM: 4.4 mmol/L (ref 3.5–5.1)
SODIUM: 141 mmol/L (ref 135–145)

## 2018-08-06 LAB — PHOSPHORUS: PHOSPHORUS: 2.2 mg/dL — AB (ref 2.5–4.6)

## 2018-08-06 LAB — APTT
aPTT: 39 seconds — ABNORMAL HIGH (ref 24–36)
aPTT: 58 seconds — ABNORMAL HIGH (ref 24–36)

## 2018-08-06 LAB — T4, FREE: Free T4: 1.19 ng/dL (ref 0.82–1.77)

## 2018-08-06 LAB — TSH: TSH: 0.101 u[IU]/mL — ABNORMAL LOW (ref 0.350–4.500)

## 2018-08-06 MED ORDER — MUPIROCIN 2 % EX OINT
1.0000 "application " | TOPICAL_OINTMENT | Freq: Two times a day (BID) | CUTANEOUS | Status: DC
  Administered 2018-08-06 (×2): 1 via NASAL
  Filled 2018-08-06: qty 22

## 2018-08-06 MED ORDER — LIDOCAINE-PRILOCAINE 2.5-2.5 % EX CREA
1.0000 "application " | TOPICAL_CREAM | CUTANEOUS | Status: DC | PRN
  Filled 2018-08-06 (×2): qty 5

## 2018-08-06 MED ORDER — SEVELAMER CARBONATE 800 MG PO TABS
1600.0000 mg | ORAL_TABLET | Freq: Three times a day (TID) | ORAL | Status: DC
  Administered 2018-08-06 – 2018-08-09 (×7): 1600 mg via ORAL
  Filled 2018-08-06 (×8): qty 2

## 2018-08-06 MED ORDER — SODIUM CHLORIDE 0.9 % IV SOLN: 100.0000 mL | INTRAVENOUS | Status: DC | PRN

## 2018-08-06 MED ORDER — ONDANSETRON HCL 4 MG PO TABS: 4.0000 mg | ORAL_TABLET | Freq: Four times a day (QID) | ORAL | Status: DC | PRN

## 2018-08-06 MED ORDER — APIXABAN 5 MG PO TABS
5.0000 mg | ORAL_TABLET | Freq: Two times a day (BID) | ORAL | Status: DC
  Administered 2018-08-06: 5 mg via ORAL
  Filled 2018-08-06: qty 1

## 2018-08-06 MED ORDER — ACETAMINOPHEN 650 MG RE SUPP: 650.0000 mg | Freq: Four times a day (QID) | RECTAL | Status: DC | PRN

## 2018-08-06 MED ORDER — DIGOXIN 125 MCG PO TABS
0.0625 mg | ORAL_TABLET | Freq: Every day | ORAL | Status: DC
  Administered 2018-08-06 – 2018-08-09 (×3): 0.0625 mg via ORAL
  Filled 2018-08-06 (×4): qty 0.5

## 2018-08-06 MED ORDER — ASPIRIN 81 MG PO CHEW
324.0000 mg | CHEWABLE_TABLET | Freq: Once | ORAL | Status: AC
  Administered 2018-08-06: 324 mg via ORAL
  Filled 2018-08-06: qty 4

## 2018-08-06 MED ORDER — ALBUTEROL SULFATE (2.5 MG/3ML) 0.083% IN NEBU: 2.5000 mg | INHALATION_SOLUTION | RESPIRATORY_TRACT | Status: DC | PRN

## 2018-08-06 MED ORDER — SUCRALFATE 1 G PO TABS
1.0000 g | ORAL_TABLET | Freq: Two times a day (BID) | ORAL | Status: DC
  Administered 2018-08-06 – 2018-08-09 (×6): 1 g via ORAL
  Filled 2018-08-06 (×7): qty 1

## 2018-08-06 MED ORDER — OXYCODONE-ACETAMINOPHEN 5-325 MG PO TABS
1.0000 | ORAL_TABLET | Freq: Four times a day (QID) | ORAL | Status: DC | PRN
  Administered 2018-08-06 – 2018-08-09 (×7): 1 via ORAL
  Filled 2018-08-06 (×7): qty 1

## 2018-08-06 MED ORDER — CARVEDILOL 12.5 MG PO TABS
12.5000 mg | ORAL_TABLET | Freq: Two times a day (BID) | ORAL | Status: DC
  Administered 2018-08-08 – 2018-08-09 (×2): 12.5 mg via ORAL
  Filled 2018-08-06 (×6): qty 1

## 2018-08-06 MED ORDER — SACUBITRIL-VALSARTAN 49-51 MG PO TABS
1.0000 | ORAL_TABLET | Freq: Two times a day (BID) | ORAL | Status: DC
  Administered 2018-08-06 – 2018-08-09 (×6): 1 via ORAL
  Filled 2018-08-06 (×8): qty 1

## 2018-08-06 MED ORDER — LIDOCAINE HCL (PF) 1 % IJ SOLN
5.0000 mL | INTRAMUSCULAR | Status: DC | PRN
  Filled 2018-08-06: qty 5

## 2018-08-06 MED ORDER — CINACALCET HCL 30 MG PO TABS
60.0000 mg | ORAL_TABLET | Freq: Every evening | ORAL | Status: DC
  Administered 2018-08-06 – 2018-08-08 (×3): 60 mg via ORAL
  Filled 2018-08-06 (×4): qty 2

## 2018-08-06 MED ORDER — ONDANSETRON HCL 4 MG/2ML IJ SOLN
4.0000 mg | Freq: Four times a day (QID) | INTRAMUSCULAR | Status: DC | PRN
  Administered 2018-08-09: 4 mg via INTRAVENOUS
  Filled 2018-08-06 (×3): qty 2

## 2018-08-06 MED ORDER — PENTAFLUOROPROP-TETRAFLUOROETH EX AERO
1.0000 "application " | INHALATION_SPRAY | CUTANEOUS | Status: DC | PRN
  Filled 2018-08-06 (×2): qty 30

## 2018-08-06 MED ORDER — MIDODRINE HCL 5 MG PO TABS
10.0000 mg | ORAL_TABLET | Freq: Every day | ORAL | Status: DC
  Administered 2018-08-08: 20 mg via ORAL
  Filled 2018-08-06 (×2): qty 4
  Filled 2018-08-06: qty 2

## 2018-08-06 MED ORDER — ASPIRIN 81 MG PO CHEW
81.0000 mg | CHEWABLE_TABLET | Freq: Every day | ORAL | Status: DC
  Administered 2018-08-06 – 2018-08-09 (×3): 81 mg via ORAL
  Filled 2018-08-06 (×4): qty 1

## 2018-08-06 MED ORDER — HYDROMORPHONE HCL 1 MG/ML IJ SOLN
0.5000 mg | Freq: Once | INTRAMUSCULAR | Status: AC
  Administered 2018-08-06: 0.5 mg via INTRAVENOUS
  Filled 2018-08-06: qty 1

## 2018-08-06 MED ORDER — ACETAMINOPHEN 325 MG PO TABS
650.0000 mg | ORAL_TABLET | Freq: Four times a day (QID) | ORAL | Status: DC | PRN
  Administered 2018-08-06: 650 mg via ORAL
  Filled 2018-08-06: qty 2

## 2018-08-06 MED ORDER — DOCUSATE SODIUM 100 MG PO CAPS
100.0000 mg | ORAL_CAPSULE | Freq: Two times a day (BID) | ORAL | Status: DC
  Administered 2018-08-06 – 2018-08-09 (×7): 100 mg via ORAL
  Filled 2018-08-06 (×7): qty 1

## 2018-08-06 MED ORDER — HEPARIN (PORCINE) 25000 UT/250ML-% IV SOLN
950.0000 [IU]/h | INTRAVENOUS | Status: DC
  Administered 2018-08-06: 850 [IU]/h via INTRAVENOUS
  Administered 2018-08-07: 950 [IU]/h via INTRAVENOUS
  Filled 2018-08-06 (×2): qty 250

## 2018-08-06 MED ORDER — CHLORHEXIDINE GLUCONATE CLOTH 2 % EX PADS
6.0000 | MEDICATED_PAD | Freq: Every day | CUTANEOUS | Status: DC
  Administered 2018-08-07 – 2018-08-08 (×2): 6 via TOPICAL

## 2018-08-06 MED ORDER — HEPARIN SODIUM (PORCINE) 1000 UNIT/ML DIALYSIS
1000.0000 [IU] | INTRAMUSCULAR | Status: DC | PRN
  Filled 2018-08-06: qty 1

## 2018-08-06 MED ORDER — LEVOTHYROXINE SODIUM 88 MCG PO TABS
88.0000 ug | ORAL_TABLET | Freq: Every day | ORAL | Status: DC
  Administered 2018-08-06 – 2018-08-08 (×3): 88 ug via ORAL
  Filled 2018-08-06 (×5): qty 1

## 2018-08-06 MED ORDER — HYDROXYZINE HCL 25 MG PO TABS: 50.0000 mg | ORAL_TABLET | Freq: Two times a day (BID) | ORAL | Status: DC | PRN

## 2018-08-06 MED ORDER — HEPARIN BOLUS VIA INFUSION
3000.0000 [IU] | Freq: Once | INTRAVENOUS | Status: AC
  Administered 2018-08-06: 3000 [IU] via INTRAVENOUS
  Filled 2018-08-06: qty 3000

## 2018-08-06 NOTE — Progress Notes (Signed)
Advanced care plan. Purpose of the Encounter: CODE STATUS Parties in Attendance: Patient Patient's Decision Capacity: Good Subjective/Patient's story: Presented to emergency room for chest pain Objective/Medical story Has history of end-stage renal disease on dialysis, CAD, chronic atrial fibrillation, hypertension Needs further evaluation for chest pain and cardiology consultation Goals of care determination:  Advance care directives goals of care and treatment plan discussed For now patient wants everything done which includes CPR, intubation ventilator if the need as CODE STATUS: Full code Time spent discussing advanced care planning: 16 minutes

## 2018-08-06 NOTE — Plan of Care (Signed)
  Problem: Activity: Goal: Risk for activity intolerance will decrease Outcome: Progressing   Problem: Nutrition: Goal: Adequate nutrition will be maintained Outcome: Progressing   Problem: Pain Managment: Goal: General experience of comfort will improve Outcome: Progressing   

## 2018-08-06 NOTE — Care Management Obs Status (Signed)
[facility] NOTIFICATION   Patient Details  Name: [name] MRN: [id number] Date of Birth: [birth year]   Medicare Observation Status Notification Given:  Yes    Heyden Jaber A Tona Qualley, RN 08/06/2018, 12:09 PM

## 2018-08-06 NOTE — Progress Notes (Signed)
ANTICOAGULATION CONSULT NOTE - Initial Consult  Pharmacy Consult for Heparin  Indication: chest pain/ACS  Allergies  Allergen Reactions  . Morphine And Related Hives  . Levaquin [Levofloxacin] Itching    Severe itching; prickly sensation  . Latex Rash  . Tape Rash    Patient Measurements: Height: 5\' 6"  (167.6 cm) Weight: 169 lb 8.5 oz (76.9 kg) IBW/kg (Calculated) : 59.3 Heparin Dosing Weight:  74 kg   Vital Signs: Temp: 98.6 F (37 C) (02/22 1916) Temp Source: Oral (02/22 1916) BP: 92/67 (02/22 1916) Pulse Rate: 65 (02/22 1916)  Labs: Recent Labs    08/05/18 2315 08/06/18 0418 08/06/18 0933 08/06/18 1201 08/06/18 1500 08/06/18 2121  HGB 11.4*  --   --   --   --   --   HCT 36.8  --   --   --   --   --   PLT 184  --   --   --   --   --   APTT  --   --   --  39*  --  58*  LABPROT  --   --   --  18.1*  --   --   INR  --   --   --  1.52  --   --   CREATININE 4.24*  --   --   --   --   --   TROPONINI 0.06* 0.06* 0.06*  --  0.05*  --     Estimated Creatinine Clearance: 15.7 mL/min (A) (by C-G formula based on SCr of 4.24 mg/dL (H)).   Medical History: Past Medical History:  Diagnosis Date  . CHF (congestive heart failure) ([place])   . Heart failure ([place])   . Hyperlipidemia   . Hypertension   . Myocardial infarction ([place])   . Paroxysmal atrial fibrillation (H[facility])   . Renal failure   . Renal insufficiency   . Stroke [facility]) 2011    Medications:  Medications Prior to Admission  Medication Sig Dispense Refill Last Dose  . albuterol (PROVENTIL HFA;VENTOLIN HFA) 108 (90 Base) MCG/ACT inhaler Inhale 2 puffs into the lungs every 4 (four) hours as needed for wheezing or shortness of breath.   prn at prn  . aspirin 81 MG chewable tablet Chew 81 mg by mouth daily.   08/05/2018 at 1900  . carvedilol (COREG) 12.5 MG tablet Take 12.5 mg by mouth 2 (two) times daily with a meal.    08/05/2018 at 1900  . cinacalcet (SENSIPAR) 60 MG tablet Take 60 mg by mouth every evening.     08/05/2018 at 1900  . digoxin (LANOXIN) 0.125 MG tablet Take 0.0625 mg by mouth daily.   08/05/2018 at 1100  . ELIQUIS 5 MG TABS tablet Take 1 tablet (5 mg total) by mouth 2 (two) times daily. 60 tablet 0 08/05/2018 at 1900  . hydrOXYzine (ATARAX/VISTARIL) 50 MG tablet Take 50 mg by mouth 2 (two) times daily as needed for itching.    prn at prn  . levothyroxine (SYNTHROID, LEVOTHROID) 88 MCG tablet Take 88 mcg by mouth at bedtime.    08/05/2018 at 1900  . midodrine (PROAMATINE) 10 MG tablet Take 10-20 mg by mouth daily. (for low BP associated with dialysis)   08/05/2018 at 0600  . sacubitril-valsartan (ENTRESTO) 49-51 MG Take 1 tablet by mouth 2 (two) times daily.    08/05/2018 at 1900  . sevelamer carbonate (RENVELA) 800 MG tablet Take 4 tablets (3200MG ) by mouth 3 times daily with meals  and 2 tablets (1600MG ) by mouth twice daily with snacks   08/05/2018 at 1900  . sucralfate (CARAFATE) 1 g tablet Take 1 g by mouth 2 (two) times daily.   08/05/2018 at 1900    Assessment: Pharmacy consulted for heparin drip dosing and monitoring in 58yo female for NSTEMI. Patient was taking apixaban 5mg  BID. Last dose @ 0928 today (2/22)   Goal of Therapy:  Heparin level 0.3-0.7 units/ml aPTT 66-102 seconds Monitor platelets by anticoagulation protocol: Yes   Plan:  Give reduced dose of 3000 units bolus x 1 Start heparin infusion at 850 units/hr Will need to adjust heparin infusion based on aPTT levels until aPTT and anti-Xa levels correlate.  Check aPTT level in 8 hours and anti-Xa and daily while on heparin Continue to monitor H&H and platelets  2/22:  APTT  @ 2121 = 58  Will increase heparin gtt rate to 950 units/hr and recheck aPTT 8 hrs after rate change on 2/23 @ 0500.  Selby Slovacek D 08/06/2018,9:59 PM

## 2018-08-06 NOTE — H&P (Signed)
[name] is an 58 y.o. female.   Chief Complaint: Chest pain HPI: The patient with past medical history of end-stage renal disease on dialysis, atrial fibrillation, CAD and hypertension presents to the emergency department complaining of chest pain.  Her pain began after she became dizzy while walking through her home.  She fell at the beginning of this episode and bruised her left leg severely.  She admits to feeling very hot but not diaphoretic at that time.  Her chest pain was located over her left breast and radiated into her left neck.  She admits to mild shortness of breath as well as nausea associated with chest pain.  Her chest still hurts despite receiving 4 chewable baby aspirin in the emergency department.  Troponin was found to be elevated at 0.06 but telemetry showed no indication of ischemia.  Due to ongoing chest pain the emergency department staff called the hospitalist service for admission.  Past Medical History:  Diagnosis Date  . CHF (congestive heart failure) ([place])   . Heart failure ([place])   . Hyperlipidemia   . Hypertension   . Myocardial infarction ([place])   . Paroxysmal atrial fibrillation (H[facility])   . Renal failure   . Renal insufficiency   . Stroke [facility]) 2011    Past Surgical History:  Procedure Laterality Date  . CORONARY ANGIOPLASTY WITH STENT PLACEMENT  2013  . CORONARY ARTERY BYPASS GRAFT    . DIALYSIS FISTULA CREATION      Family History  Problem Relation Age of Onset  . Hypertension Other   . Cancer Other   . Renal Disease Other   . Ovarian cancer Mother   . Kidney disease Father    Social History:  reports that she has been smoking cigarettes. She has a 2.00 pack-year smoking history. She has never used smokeless tobacco. She reports that she does not drink alcohol or use drugs.  Allergies:  Allergies  Allergen Reactions  . Morphine And Related Hives  . Levaquin [Levofloxacin] Itching    Severe itching; prickly sensation  . Latex Rash  .  Tape Rash    Medications Prior to Admission  Medication Sig Dispense Refill  . albuterol (PROVENTIL HFA;VENTOLIN HFA) 108 (90 Base) MCG/ACT inhaler Inhale 2 puffs into the lungs every 4 (four) hours as needed for wheezing or shortness of breath.    Marland Kitchen aspirin 81 MG chewable tablet Chew 81 mg by mouth daily.    . carvedilol (COREG) 12.5 MG tablet Take 12.5 mg by mouth 2 (two) times daily with a meal.     . cinacalcet (SENSIPAR) 60 MG tablet Take 60 mg by mouth every evening.     . digoxin (LANOXIN) 0.125 MG tablet Take 0.0625 mg by mouth daily.    Marland Kitchen ELIQUIS 5 MG TABS tablet Take 1 tablet (5 mg total) by mouth 2 (two) times daily. 60 tablet 0  . hydrOXYzine (ATARAX/VISTARIL) 50 MG tablet Take 50 mg by mouth 2 (two) times daily as needed for itching.     . levothyroxine (SYNTHROID, LEVOTHROID) 88 MCG tablet Take 88 mcg by mouth at bedtime.     . midodrine (PROAMATINE) 10 MG tablet Take 10-20 mg by mouth daily. (for low BP associated with dialysis)    . sacubitril-valsartan (ENTRESTO) 49-51 MG Take 1 tablet by mouth 2 (two) times daily.     . sevelamer carbonate (RENVELA) 800 MG tablet Take 4 tablets (3200MG ) by mouth 3 times daily with meals and 2 tablets (1600MG ) by mouth  twice daily with snacks    . sucralfate (CARAFATE) 1 g tablet Take 1 g by mouth 2 (two) times daily.      Results for orders placed or performed during the hospital encounter of 08/05/18 (from the past 48 hour(s))  Basic metabolic panel     Status: Abnormal   Collection Time: 08/05/18 11:15 PM  Result Value Ref Range   Sodium 141 135 - 145 mmol/L   Potassium 4.4 3.5 - 5.1 mmol/L   Chloride 104 98 - 111 mmol/L   CO2 30 22 - 32 mmol/L   Glucose, Bld 92 70 - 99 mg/dL   BUN 17 6 - 20 mg/dL   Creatinine, Ser 4.24 (H) 0.44 - 1.00 mg/dL   Calcium 8.1 (L) 8.9 - 10.3 mg/dL   GFR calc non Af Amer 11 (L) >60 mL/min   GFR calc Af Amer 13 (L) >60 mL/min   Anion gap 7 5 - 15    Comment: Performed at [facility], [address] [place], [place] [address]  CBC     Status: Abnormal   Collection Time: 08/05/18 11:15 PM  Result Value Ref Range   WBC 3.9 (L) 4.0 - 10.5 K/uL   RBC 3.59 (L) 3.87 - 5.11 MIL/uL   Hemoglobin 11.4 (L) 12.0 - 15.0 g/dL   HCT 36.8 36.0 - 46.0 %   MCV 102.5 (H) 80.0 - 100.0 fL   MCH 31.8 26.0 - 34.0 pg   MCHC 31.0 30.0 - 36.0 g/dL   RDW 17.2 (H) 11.5 - 15.5 %   Platelets 184 150 - 400 K/uL   nRBC 0.0 0.0 - 0.2 %    Comment: Performed at [facility], [address] [place], [place] [address]  Troponin I - ONCE - STAT     Status: Abnormal   Collection Time: 08/05/18 11:15 PM  Result Value Ref Range   Troponin I 0.06 (HH) <0.03 ng/mL    Comment: CRITICAL RESULT CALLED TO, READ BACK BY AND VERIFIED WITH RACHAEL HAYDEN 08/06/18 @ 0014  [place] Performed at [facility], [address] [place],  [address]    Dg Chest 1 View  Result Date: 08/06/2018 CLINICAL DATA:  58 y/o  F; chest pain after dialysis. EXAM: CHEST  1 VIEW COMPARISON:  06/01/2018 chest radiograph FINDINGS: Stable cardiomegaly given projection and technique. Post median sternotomy with wires in alignment. Diffuse reticular opacities compatible with interstitial pulmonary edema. No focal consolidation. No pleural effusion or pneumothorax. No acute osseous abnormality is evident. IMPRESSION: Interstitial pulmonary edema. Stable cardiomegaly. Electronically Signed   By: Kristine Garbe M.D.   On: 08/06/2018 00:15   Dg Tibia/fibula Left  Result Date: 08/05/2018 CLINICAL DATA:  58 year old female with fall and trauma to the left lower extremity. EXAM: LEFT TIBIA AND FIBULA - 2 VIEW COMPARISON:  Left lower extremity radiograph dated 07/18/2015 FINDINGS: There is no acute fracture or dislocation. The bones are well mineralized. No arthritic changes. Vascular calcifications noted. There is a focal area of skin contusion or hematoma along the anterior shin. Surgical clips noted in the soft  tissues of the calf. IMPRESSION: No acute fracture or dislocation. Electronically Signed   By: Anner Crete M.D.   On: 08/05/2018 23:52   Ct Head Wo Contrast  Result Date: 08/05/2018 CLINICAL DATA:  58 year old female with dizziness and fall. EXAM: CT HEAD WITHOUT CONTRAST TECHNIQUE: Contiguous axial images were obtained from the base of the skull through the vertex without intravenous contrast.  COMPARISON:  Head CT dated 01/13/2016 and brain MRI dated 06/24/2014 FINDINGS: Brain: Scattered areas of old infarcts similar to prior CT, possibly sequela of embolic process. No acute intracranial hemorrhage. No mass effect or midline shift. No extra-axial fluid collection. Vascular: No hyperdense vessel or unexpected calcification. Skull: Normal. Negative for fracture or focal lesion. Sinuses/Orbits: No acute finding. Other: None IMPRESSION: 1. No acute intracranial hemorrhage. 2. Scattered areas of old infarcts similar to prior CT, possibly sequela of embolic process. Electronically Signed   By: Anner Crete M.D.   On: 08/05/2018 23:56    Review of Systems  Constitutional: Negative for chills and fever.  HENT: Negative for sore throat and tinnitus.   Eyes: Negative for blurred vision and redness.  Respiratory: Negative for cough and shortness of breath.   Cardiovascular: Positive for chest pain. Negative for palpitations, orthopnea and PND.  Gastrointestinal: Negative for abdominal pain, diarrhea, nausea and vomiting.  Genitourinary: Negative for dysuria, frequency and urgency.  Musculoskeletal: Negative for joint pain and myalgias.  Skin: Negative for rash.       No lesions  Neurological: Negative for speech change, focal weakness and weakness.  Endo/Heme/Allergies: Does not bruise/bleed easily.       No temperature intolerance  Psychiatric/Behavioral: Negative for depression and suicidal ideas.    Blood pressure 110/60, pulse 60, temperature 97.8 F (36.6 C), temperature source Oral,  resp. rate 18, height 5\' 6"  (1.676 m), weight 77.5 kg, SpO2 100 %. Physical Exam  Vitals reviewed. Constitutional: She is oriented to person, place, and time. She appears well-developed and well-nourished. No distress.  HENT:  Head: Normocephalic and atraumatic.  Mouth/Throat: Oropharynx is clear and moist.  Eyes: Pupils are equal, round, and reactive to light. Conjunctivae and EOM are normal. No scleral icterus.  Neck: Normal range of motion. Neck supple. No JVD present. No tracheal deviation present. No thyromegaly present.  Cardiovascular: Normal rate, regular rhythm and normal heart sounds. Exam reveals no gallop and no friction rub.  No murmur heard. Respiratory: Effort normal and breath sounds normal.  GI: Soft. Bowel sounds are normal. She exhibits no distension. There is no abdominal tenderness.  Genitourinary:    Genitourinary Comments: Deferred   Musculoskeletal: Normal range of motion.        General: No edema.  Lymphadenopathy:    She has no cervical adenopathy.  Neurological: She is alert and oriented to person, place, and time. No cranial nerve deficit. She exhibits normal muscle tone.  Skin: Skin is warm and dry. No rash noted. No erythema.  Psychiatric: She has a normal mood and affect. Her behavior is normal. Judgment and thought content normal.     Assessment/Plan This is a 57 year old female admitted for chest pain. 1.  Chest pain: Atypical in duration although the patient reports that her chest hurts the same way it did during 1 of her previous heart attacks.  Notably however, the patient was not exerting herself as she was at the time of her first heart attack.  Continue to follow cardiac biomarkers.  Monitor telemetry.  Consult cardiology. 2.  Hypertension: Controlled; continue carvedilol 3.  CHF: Stable; continue digoxin and Entresto 4.  Hyperthyroidism: Iatrogenic; appears to be a new problem.  The patient is on Synthroid.  Check free T4 as albumin binding is  likely low due to dialysis.  Consider decrease thyroxine dose 5.  ESRD: On dialysis Monday Wednesday Friday.  Elevated troponin may be due to poor renal clearance. 6.  Secondary hyperparathyroidism: Continue Renvela  7.  Fall: Secondary to dizziness; etiology likely labile blood pressure.  The patient is on midodrine 8.  DVT prophylaxis: Eliquis 9.  GI prophylaxis: None The patient is a full code.  Time spent on admission orders and patient care approximately 45 minutes   Harrie Foreman, MD 08/06/2018, 4:13 AM

## 2018-08-06 NOTE — Consult Note (Signed)
ANTICOAGULATION CONSULT NOTE - Initial Consult  Pharmacy Consult for Heparin Infusion  Indication: chest pain/ACS  Allergies  Allergen Reactions  . Morphine And Related Hives  . Levaquin [Levofloxacin] Itching    Severe itching; prickly sensation  . Latex Rash  . Tape Rash    Patient Measurements: Height: 5\' 6"  (167.6 cm) Weight: 170 lb 12.8 oz (77.5 kg) IBW/kg (Calculated) : 59.3 Heparin Dosing Weight: 74kg  Vital Signs: Temp: 98.5 F (36.9 C) (02/22 0808) Temp Source: Oral (02/22 0808) BP: 98/57 (02/22 0808) Pulse Rate: 52 (02/22 0808)  Labs: Recent Labs    08/05/18 2315 08/06/18 0418 08/06/18 0933 08/06/18 1201  HGB 11.4*  --   --   --   HCT 36.8  --   --   --   PLT 184  --   --   --   APTT  --   --   --  39*  LABPROT  --   --   --  18.1*  INR  --   --   --  1.52  CREATININE 4.24*  --   --   --   TROPONINI 0.06* 0.06* 0.06*  --     Estimated Creatinine Clearance: 15.8 mL/min (A) (by C-G formula based on SCr of 4.24 mg/dL (H)).   Medical History: Past Medical History:  Diagnosis Date  . CHF (congestive heart failure) ([place])   . Heart failure ([place])   . Hyperlipidemia   . Hypertension   . Myocardial infarction ([place])   . Paroxysmal atrial fibrillation (HCC)   . Renal failure   . Renal insufficiency   . Stroke [facility]) 2011    Medications:  Apixaban 5mg  BID  Assessment: Pharmacy consulted for heparin drip dosing and monitoring in 58yo female for NSTEMI. Patient was taking apixaban 5mg  BID. Last dose @ 0928 today (2/22)   Goal of Therapy:  Heparin level 0.3-0.7 units/ml aPTT 66-102 seconds Monitor platelets by anticoagulation protocol: Yes   Plan:  Give reduced dose of 3000 units bolus x 1 Start heparin infusion at 850 units/hr Will need to adjust heparin infusion based on aPTT levels until aPTT and anti-Xa levels correlate.  Check aPTT level in 8 hours and anti-Xa and daily while on heparin Continue to monitor H&H and platelets  Pernell Dupre, PharmD, BCPS Clinical Pharmacist 08/06/2018 12:48 PM

## 2018-08-06 NOTE — Consult Note (Signed)
[name] is a 58 y.o. female  [id number]  Primary Cardiologist: Neoma Laming Reason for Consultation: Chest pain  HPI: This is a 58 year old African-American female with past medical history of CHF atrial fibrillation renal failure presented to the hospital with shortness of breath and chest pain.  Chest pain was described as pressure type associated with shortness of breath and diaphoresis.   Review of Systems: Still having chest pain.   Past Medical History:  Diagnosis Date  . CHF (congestive heart failure) ([place])   . Heart failure ([place])   . Hyperlipidemia   . Hypertension   . Myocardial infarction ([place])   . Paroxysmal atrial fibrillation (HCC)   . Renal failure   . Renal insufficiency   . Stroke [facility]) 2011    Medications Prior to Admission  Medication Sig Dispense Refill  . albuterol (PROVENTIL HFA;VENTOLIN HFA) 108 (90 Base) MCG/ACT inhaler Inhale 2 puffs into the lungs every 4 (four) hours as needed for wheezing or shortness of breath.    Marland Kitchen aspirin 81 MG chewable tablet Chew 81 mg by mouth daily.    . carvedilol (COREG) 12.5 MG tablet Take 12.5 mg by mouth 2 (two) times daily with a meal.     . cinacalcet (SENSIPAR) 60 MG tablet Take 60 mg by mouth every evening.     . digoxin (LANOXIN) 0.125 MG tablet Take 0.0625 mg by mouth daily.    Marland Kitchen ELIQUIS 5 MG TABS tablet Take 1 tablet (5 mg total) by mouth 2 (two) times daily. 60 tablet 0  . hydrOXYzine (ATARAX/VISTARIL) 50 MG tablet Take 50 mg by mouth 2 (two) times daily as needed for itching.     . levothyroxine (SYNTHROID, LEVOTHROID) 88 MCG tablet Take 88 mcg by mouth at bedtime.     . midodrine (PROAMATINE) 10 MG tablet Take 10-20 mg by mouth daily. (for low BP associated with dialysis)    . sacubitril-valsartan (ENTRESTO) 49-51 MG Take 1 tablet by mouth 2 (two) times daily.     . sevelamer carbonate (RENVELA) 800 MG tablet Take 4 tablets (3200MG ) by mouth 3 times daily with meals and 2 tablets (1600MG ) by mouth  twice daily with snacks    . sucralfate (CARAFATE) 1 g tablet Take 1 g by mouth 2 (two) times daily.       Marland Kitchen apixaban  5 mg Oral BID  . aspirin  81 mg Oral Daily  . carvedilol  12.5 mg Oral BID WC  . [START ON 08/07/2018] Chlorhexidine Gluconate Cloth  6 each Topical Q0600  . cinacalcet  60 mg Oral QPM  . digoxin  0.0625 mg Oral Daily  . docusate sodium  100 mg Oral BID  . levothyroxine  88 mcg Oral QHS  . midodrine  10-20 mg Oral Daily  . mupirocin ointment  1 application Nasal BID  . sacubitril-valsartan  1 tablet Oral BID  . sevelamer carbonate  1,600 mg Oral TID WC  . sucralfate  1 g Oral BID    Infusions:   Allergies  Allergen Reactions  . Morphine And Related Hives  . Levaquin [Levofloxacin] Itching    Severe itching; prickly sensation  . Latex Rash  . Tape Rash    Social History   Socioeconomic History  . Marital status: Single    Spouse name: Not on file  . Number of children: Not on file  . Years of education: Not on file  . Highest education level: Not on file  Occupational History  .  Not on file  Social Needs  . Financial resource strain: Not on file  . Food insecurity:    Worry: Not on file    Inability: Not on file  . Transportation needs:    Medical: Not on file    Non-medical: Not on file  Tobacco Use  . Smoking status: Current Some Day Smoker    Packs/day: 0.10    Years: 20.00    Pack years: 2.00    Types: Cigarettes  . Smokeless tobacco: Never Used  Substance and Sexual Activity  . Alcohol use: No  . Drug use: No  . Sexual activity: Not Currently  Lifestyle  . Physical activity:    Days per week: Not on file    Minutes per session: Not on file  . Stress: Not on file  Relationships  . Social connections:    Talks on phone: Not on file    Gets together: Not on file    Attends religious service: Not on file    Active member of club or organization: Not on file    Attends meetings of clubs or organizations: Not on file     Relationship status: Not on file  . Intimate partner violence:    Fear of current or ex partner: Not on file    Emotionally abused: Not on file    Physically abused: Not on file    Forced sexual activity: Not on file  Other Topics Concern  . Not on file  Social History Narrative  . Not on file    Family History  Problem Relation Age of Onset  . Hypertension Other   . Cancer Other   . Renal Disease Other   . Ovarian cancer Mother   . Kidney disease Father     PHYSICAL EXAM: Vitals:   08/06/18 0332 08/06/18 0808  BP: 110/60 (!) 98/57  Pulse: 60 (!) 52  Resp: 18 19  Temp: 97.8 F (36.6 C) 98.5 F (36.9 C)  SpO2: 100% 98%     Intake/Output Summary (Last 24 hours) at 08/06/2018 1141 Last data filed at 08/06/2018 9373 Gross per 24 hour  Intake -  Output 0 ml  Net 0 ml    General:  Well appearing. No respiratory difficulty HEENT: normal Neck: supple. no JVD. Carotids 2+ bilat; no bruits. No lymphadenopathy or thryomegaly appreciated. Cor: PMI nondisplaced. Regular rate & rhythm. No rubs, gallops or murmurs. Lungs: clear Abdomen: soft, nontender, nondistended. No hepatosplenomegaly. No bruits or masses. Good bowel sounds. Extremities: no cyanosis, clubbing, rash, edema Neuro: alert & oriented x 3, cranial nerves grossly intact. moves all 4 extremities w/o difficulty. Affect pleasant.  ECG: Sinus rhythm with old inferior wall MI with nonspecific ST-T changes  Results for orders placed or performed during the hospital encounter of 08/05/18 (from the past 24 hour(s))  Basic metabolic panel     Status: Abnormal   Collection Time: 08/05/18 11:15 PM  Result Value Ref Range   Sodium 141 135 - 145 mmol/L   Potassium 4.4 3.5 - 5.1 mmol/L   Chloride 104 98 - 111 mmol/L   CO2 30 22 - 32 mmol/L   Glucose, Bld 92 70 - 99 mg/dL   BUN 17 6 - 20 mg/dL   Creatinine, Ser 4.24 (H) 0.44 - 1.00 mg/dL   Calcium 8.1 (L) 8.9 - 10.3 mg/dL   GFR calc non Af Amer 11 (L) >60 mL/min   GFR  calc Af Amer 13 (L) >60 mL/min   Anion gap  7 5 - 15  CBC     Status: Abnormal   Collection Time: 08/05/18 11:15 PM  Result Value Ref Range   WBC 3.9 (L) 4.0 - 10.5 K/uL   RBC 3.59 (L) 3.87 - 5.11 MIL/uL   Hemoglobin 11.4 (L) 12.0 - 15.0 g/dL   HCT 36.8 36.0 - 46.0 %   MCV 102.5 (H) 80.0 - 100.0 fL   MCH 31.8 26.0 - 34.0 pg   MCHC 31.0 30.0 - 36.0 g/dL   RDW 17.2 (H) 11.5 - 15.5 %   Platelets 184 150 - 400 K/uL   nRBC 0.0 0.0 - 0.2 %  Troponin I - ONCE - STAT     Status: Abnormal   Collection Time: 08/05/18 11:15 PM  Result Value Ref Range   Troponin I 0.06 (HH) <0.03 ng/mL  TSH     Status: Abnormal   Collection Time: 08/06/18  4:18 AM  Result Value Ref Range   TSH 0.101 (L) 0.350 - 4.500 uIU/mL  Troponin I - Now Then Q6H     Status: Abnormal   Collection Time: 08/06/18  4:18 AM  Result Value Ref Range   Troponin I 0.06 (HH) <0.03 ng/mL  MRSA PCR Screening     Status: Abnormal   Collection Time: 08/06/18  4:29 AM  Result Value Ref Range   MRSA by PCR POSITIVE (A) NEGATIVE  Troponin I - Now Then Q6H     Status: Abnormal   Collection Time: 08/06/18  9:33 AM  Result Value Ref Range   Troponin I 0.06 (HH) <0.03 ng/mL  T4, free     Status: None   Collection Time: 08/06/18  9:33 AM  Result Value Ref Range   Free T4 1.19 0.82 - 1.77 ng/dL   Dg Chest 1 View  Result Date: 08/06/2018 CLINICAL DATA:  58 y/o  F; chest pain after dialysis. EXAM: CHEST  1 VIEW COMPARISON:  06/01/2018 chest radiograph FINDINGS: Stable cardiomegaly given projection and technique. Post median sternotomy with wires in alignment. Diffuse reticular opacities compatible with interstitial pulmonary edema. No focal consolidation. No pleural effusion or pneumothorax. No acute osseous abnormality is evident. IMPRESSION: Interstitial pulmonary edema. Stable cardiomegaly. Electronically Signed   By: Kristine Garbe M.D.   On: 08/06/2018 00:15   Dg Tibia/fibula Left  Result Date: 08/05/2018 CLINICAL DATA:   58 year old female with fall and trauma to the left lower extremity. EXAM: LEFT TIBIA AND FIBULA - 2 VIEW COMPARISON:  Left lower extremity radiograph dated 07/18/2015 FINDINGS: There is no acute fracture or dislocation. The bones are well mineralized. No arthritic changes. Vascular calcifications noted. There is a focal area of skin contusion or hematoma along the anterior shin. Surgical clips noted in the soft tissues of the calf. IMPRESSION: No acute fracture or dislocation. Electronically Signed   By: Anner Crete M.D.   On: 08/05/2018 23:52   Ct Head Wo Contrast  Result Date: 08/05/2018 CLINICAL DATA:  58 year old female with dizziness and fall. EXAM: CT HEAD WITHOUT CONTRAST TECHNIQUE: Contiguous axial images were obtained from the base of the skull through the vertex without intravenous contrast. COMPARISON:  Head CT dated 01/13/2016 and brain MRI dated 06/24/2014 FINDINGS: Brain: Scattered areas of old infarcts similar to prior CT, possibly sequela of embolic process. No acute intracranial hemorrhage. No mass effect or midline shift. No extra-axial fluid collection. Vascular: No hyperdense vessel or unexpected calcification. Skull: Normal. Negative for fracture or focal lesion. Sinuses/Orbits: No acute finding. Other: None IMPRESSION: 1. No acute  intracranial hemorrhage. 2. Scattered areas of old infarcts similar to prior CT, possibly sequela of embolic process. Electronically Signed   By: Anner Crete M.D.   On: 08/05/2018 23:56     ASSESSMENT AND PLAN: Mildly elevated weighted troponin with nonspecific ST-T changes and chest pain and multiple risk factors for coronary artery disease.  Patient had a nuclear stress test in the office for chest pain and had equivocal findings.  Since patient is already in the hospital and troponins are elevated.  Advise cardiac catheterization on Monday a.m.  Patient was explained risk and benefits and patient has agreed to the procedure.  Zacariah Belue A

## 2018-08-06 NOTE — Progress Notes (Addendum)
[facility] [place] NAME: [name]    MR#:  [id number]  DATE OF BIRTH:  08/12/1960  SUBJECTIVE:  CHIEF COMPLAINT:   Chief Complaint  Patient presents with  . Fall  . Dizziness  Patient seen and evaluated today With complaints of chest pain on the left side Mild shortness of breath No dizziness  REVIEW OF SYSTEMS:    ROS  CONSTITUTIONAL: No documented fever. Has fatigue, weakness. No weight gain, no weight loss.  EYES: No blurry or double vision.  ENT: No tinnitus. No postnasal drip. No redness of the oropharynx.  RESPIRATORY: No cough, no wheeze, no hemoptysis. mild dyspnea.  CARDIOVASCULAR: Has chest pain. No orthopnea. No palpitations. No syncope.  GASTROINTESTINAL: No nausea, no vomiting or diarrhea. No abdominal pain. No melena or hematochezia.  GENITOURINARY: No dysuria or hematuria.  ENDOCRINE: No polyuria or nocturia. No heat or cold intolerance.  HEMATOLOGY: No anemia. No bruising. No bleeding.  INTEGUMENTARY: No rashes. No lesions.  MUSCULOSKELETAL: No arthritis. No swelling. No gout.  NEUROLOGIC: No numbness, tingling, or ataxia. No seizure-type activity.  PSYCHIATRIC: No anxiety. No insomnia. No ADD.   DRUG ALLERGIES:   Allergies  Allergen Reactions  . Morphine And Related Hives  . Levaquin [Levofloxacin] Itching    Severe itching; prickly sensation  . Latex Rash  . Tape Rash    VITALS:  Blood pressure (!) 98/57, pulse (!) 52, temperature 98.5 F (36.9 C), temperature source Oral, resp. rate 19, height 5\' 6"  (1.676 m), weight 77.5 kg, SpO2 98 %.  PHYSICAL EXAMINATION:   Physical Exam  GENERAL:  58 y.o.-year-old patient lying in the bed with no acute distress.  EYES: Pupils equal, round, reactive to light and accommodation. No scleral icterus. Extraocular muscles intact.  HEENT: Head atraumatic, normocephalic. Oropharynx and nasopharynx clear.  NECK:  Supple, no jugular venous distention. No thyroid  enlargement, no tenderness.  LUNGS: Normal breath sounds bilaterally, no wheezing, rales, rhonchi. No use of accessory muscles of respiration.  CARDIOVASCULAR: S1, S2 normal. No murmurs, rubs, or gallops.  ABDOMEN: Soft, nontender, nondistended. Bowel sounds present. No organomegaly or mass.  EXTREMITIES: No cyanosis, clubbing or edema b/l.    NEUROLOGIC: Cranial nerves II through XII are intact. No focal Motor or sensory deficits b/l.   PSYCHIATRIC: The patient is alert and oriented x 3.  SKIN: No obvious rash, lesion, or ulcer.   LABORATORY PANEL:   CBC Recent Labs  Lab 08/05/18 2315  WBC 3.9*  HGB 11.4*  HCT 36.8  PLT 184   ------------------------------------------------------------------------------------------------------------------ Chemistries  Recent Labs  Lab 08/05/18 2315  NA 141  K 4.4  CL 104  CO2 30  GLUCOSE 92  BUN 17  CREATININE 4.24*  CALCIUM 8.1*   ------------------------------------------------------------------------------------------------------------------  Cardiac Enzymes Recent Labs  Lab 08/06/18 0933  TROPONINI 0.06*   ------------------------------------------------------------------------------------------------------------------  RADIOLOGY:  Dg Chest 1 View  Result Date: 08/06/2018 CLINICAL DATA:  58 y/o  F; chest pain after dialysis. EXAM: CHEST  1 VIEW COMPARISON:  06/01/2018 chest radiograph FINDINGS: Stable cardiomegaly given projection and technique. Post median sternotomy with wires in alignment. Diffuse reticular opacities compatible with interstitial pulmonary edema. No focal consolidation. No pleural effusion or pneumothorax. No acute osseous abnormality is evident. IMPRESSION: Interstitial pulmonary edema. Stable cardiomegaly. Electronically Signed   By: Kristine Garbe M.D.   On: 08/06/2018 00:15   Dg Tibia/fibula Left  Result Date: 08/05/2018 CLINICAL DATA:  58 year old female with fall and trauma to the  left lower  extremity. EXAM: LEFT TIBIA AND FIBULA - 2 VIEW COMPARISON:  Left lower extremity radiograph dated 07/18/2015 FINDINGS: There is no acute fracture or dislocation. The bones are well mineralized. No arthritic changes. Vascular calcifications noted. There is a focal area of skin contusion or hematoma along the anterior shin. Surgical clips noted in the soft tissues of the calf. IMPRESSION: No acute fracture or dislocation. Electronically Signed   By: Anner Crete M.D.   On: 08/05/2018 23:52   Ct Head Wo Contrast  Result Date: 08/05/2018 CLINICAL DATA:  58 year old female with dizziness and fall. EXAM: CT HEAD WITHOUT CONTRAST TECHNIQUE: Contiguous axial images were obtained from the base of the skull through the vertex without intravenous contrast. COMPARISON:  Head CT dated 01/13/2016 and brain MRI dated 06/24/2014 FINDINGS: Brain: Scattered areas of old infarcts similar to prior CT, possibly sequela of embolic process. No acute intracranial hemorrhage. No mass effect or midline shift. No extra-axial fluid collection. Vascular: No hyperdense vessel or unexpected calcification. Skull: Normal. Negative for fracture or focal lesion. Sinuses/Orbits: No acute finding. Other: None IMPRESSION: 1. No acute intracranial hemorrhage. 2. Scattered areas of old infarcts similar to prior CT, possibly sequela of embolic process. Electronically Signed   By: Anner Crete M.D.   On: 08/05/2018 23:56     ASSESSMENT AND PLAN:   58 year old female patient with history of end-stage renal disease on dialysis, chronic congestive heart failure, hypertension, secondary hyperparathyroidism currently under hospitalist service for chest pain  -Non-STEMI IV heparin drip for anticoagulation Case has been discussed with Dr. Humphrey Rolls cardiology Recommends cardiac cath on Monday We will keep patient n.p.o. from tomorrow midnight Continue aspirin, beta-blocker  -Chronic congestive heart failure appears stable Continue  Entresto Continue beta-blocker Monitor input output chart and daily body weights  -End-stage renal disease Status post nephrology evaluation Dialysis today Continue Renvela  -Thyroid disease Synthroid supplements  -DVT prophylaxis On anticoagulation with IV heparin  All the records are reviewed and case discussed with Care Management/Social Worker. Management plans discussed with the patient, family and they are in agreement.  CODE STATUS: Full code  DVT Prophylaxis: SCDs  TOTAL TIME TAKING CARE OF THIS PATIENT: 36 minutes.   POSSIBLE D/C IN 2 to 3 DAYS, DEPENDING ON CLINICAL CONDITION.  Saundra Shelling M.D on 08/06/2018 at 12:12 PM  Between 7am to 6pm - Pager - [phone]  After 6pm go to [email or web address] - password [email or web address] [facility]  Office  [phone]  CC: Primary care physician; [name], MD  Note: This dictation was prepared with Dragon dictation along with smaller phrase technology. Any transcriptional errors that result from this process are unintentional.

## 2018-08-06 NOTE — Progress Notes (Signed)
[place], [place] 08/06/18  Subjective:   Patient known to our practice from outpatient dialysis.  She underwent her hemodialysis yesterday She has EDW of 76 kg. Started treatment with 79.2 kg and discharged at 77.2 kg Reports feeling dizzy at home and fell down.  Has a hematoma on her left shin. She also reports sharp chest pain in the midsternal region Currently being admitted for observation.  Her chest x-ray shows pulmonary vascular congestion and mild pulmonary edema.  Patient requires oxygen by nasal cannula at baseline 3 L/min.  No increased requirement of oxygen but patient feels shortness of breath Nephrology consult requested for evaluation of dialysis  Objective:  Vital signs in last 24 hours:  Temp:  [97.8 F (36.6 C)-98.5 F (36.9 C)] 98.5 F (36.9 C) (02/22 0808) Pulse Rate:  [52-85] 52 (02/22 0808) Resp:  [13-19] 19 (02/22 0808) BP: (95-117)/(54-96) 98/57 (02/22 0808) SpO2:  [85 %-100 %] 98 % (02/22 0808) Weight:  [76.2 kg-77.5 kg] 77.5 kg (02/22 0332)  Weight change:  Filed Weights   08/05/18 2309 08/06/18 0332  Weight: 76.2 kg 77.5 kg    Intake/Output:    Intake/Output Summary (Last 24 hours) at 08/06/2018 1338 Last data filed at 08/06/2018 6568 Gross per 24 hour  Intake -  Output 0 ml  Net 0 ml     Physical Exam: General:  No acute distress, lying in the bed  HEENT  anicteric, moist oral mucous membrane  Neck  supple  Pulm/lungs  mild bilateral diffuse crackles  CVS/Heart  regular, no rub  Abdomen:   Soft, nontender  Extremities:  No peripheral edema,  Neurologic:  Alert, oriented  Skin:  left shin hematoma  Access:  Left upper arm AV fistula, with good bruit and thrill       Basic Metabolic Panel:  Recent Labs  Lab 08/05/18 2315  NA 141  K 4.4  CL 104  CO2 30  GLUCOSE 92  BUN 17  CREATININE 4.24*  CALCIUM 8.1*     CBC: Recent Labs  Lab 08/05/18 2315  WBC 3.9*  HGB 11.4*  HCT 36.8  MCV 102.5*   PLT 184      Lab Results  Component Value Date   HEPBSAG Negative 06/02/2018      Microbiology:  Recent Results (from the past 240 hour(s))  MRSA PCR Screening     Status: Abnormal   Collection Time: 08/06/18  4:29 AM  Result Value Ref Range Status   MRSA by PCR POSITIVE (A) NEGATIVE Final    Comment:        The GeneXpert MRSA Assay (FDA approved for NASAL specimens only), is one component of a comprehensive MRSA colonization surveillance program. It is not intended to diagnose MRSA infection nor to guide or monitor treatment for MRSA infections. RESULT CALLED TO, READ BACK BY AND VERIFIED WITH: KAT MURRAY 08/06/18 @ 1275  [place] Performed at [facility] [place], [place] [address]     Coagulation Studies: Recent Labs    08/06/18 1201  LABPROT 18.1*  INR 1.52    Urinalysis: No results for input(s): COLORURINE, LABSPEC, PHURINE, GLUCOSEU, HGBUR, BILIRUBINUR, KETONESUR, PROTEINUR, UROBILINOGEN, NITRITE, LEUKOCYTESUR in the last 72 hours.  Invalid input(s): APPERANCEUR    Imaging: Dg Chest 1 View  Result Date: 08/06/2018 CLINICAL DATA:  59 y/o  F; chest pain after dialysis. EXAM: CHEST  1 VIEW COMPARISON:  06/01/2018 chest radiograph FINDINGS: Stable cardiomegaly given projection and technique. Post median  sternotomy with wires in alignment. Diffuse reticular opacities compatible with interstitial pulmonary edema. No focal consolidation. No pleural effusion or pneumothorax. No acute osseous abnormality is evident. IMPRESSION: Interstitial pulmonary edema. Stable cardiomegaly. Electronically Signed   By: Kristine Garbe M.D.   On: 08/06/2018 00:15   Dg Tibia/fibula Left  Result Date: 08/05/2018 CLINICAL DATA:  58 year old female with fall and trauma to the left lower extremity. EXAM: LEFT TIBIA AND FIBULA - 2 VIEW COMPARISON:  Left lower extremity radiograph dated 07/18/2015 FINDINGS: There is no acute fracture or dislocation. The  bones are well mineralized. No arthritic changes. Vascular calcifications noted. There is a focal area of skin contusion or hematoma along the anterior shin. Surgical clips noted in the soft tissues of the calf. IMPRESSION: No acute fracture or dislocation. Electronically Signed   By: Anner Crete M.D.   On: 08/05/2018 23:52   Ct Head Wo Contrast  Result Date: 08/05/2018 CLINICAL DATA:  58 year old female with dizziness and fall. EXAM: CT HEAD WITHOUT CONTRAST TECHNIQUE: Contiguous axial images were obtained from the base of the skull through the vertex without intravenous contrast. COMPARISON:  Head CT dated 01/13/2016 and brain MRI dated 06/24/2014 FINDINGS: Brain: Scattered areas of old infarcts similar to prior CT, possibly sequela of embolic process. No acute intracranial hemorrhage. No mass effect or midline shift. No extra-axial fluid collection. Vascular: No hyperdense vessel or unexpected calcification. Skull: Normal. Negative for fracture or focal lesion. Sinuses/Orbits: No acute finding. Other: None IMPRESSION: 1. No acute intracranial hemorrhage. 2. Scattered areas of old infarcts similar to prior CT, possibly sequela of embolic process. Electronically Signed   By: Anner Crete M.D.   On: 08/05/2018 23:56     Medications:   . sodium chloride    . sodium chloride    . heparin 850 Units/hr (08/06/18 1326)   . aspirin  81 mg Oral Daily  . carvedilol  12.5 mg Oral BID WC  . [START ON 08/07/2018] Chlorhexidine Gluconate Cloth  6 each Topical Q0600  . cinacalcet  60 mg Oral QPM  . digoxin  0.0625 mg Oral Daily  . docusate sodium  100 mg Oral BID  . levothyroxine  88 mcg Oral QHS  . midodrine  10-20 mg Oral Daily  . mupirocin ointment  1 application Nasal BID  . sacubitril-valsartan  1 tablet Oral BID  . sevelamer carbonate  1,600 mg Oral TID WC  . sucralfate  1 g Oral BID   sodium chloride, sodium chloride, acetaminophen **OR** acetaminophen, albuterol, heparin, hydrOXYzine,  lidocaine (PF), lidocaine-prilocaine, ondansetron **OR** ondansetron (ZOFRAN) IV, oxyCODONE-acetaminophen, pentafluoroprop-tetrafluoroeth  Assessment/ Plan:  58 y.o. African-American female withESRD on HD MWF, hypertension, congestive heart failure, anemia of CKD, SHPTH, myocardial infarction, hx of CVA, prior episodes of hyperkalemia,pulmonary embolism8/2019, ascites, nodular hepatic contour noted on CT December 2019  [facility]/ [facility]/MWF/EDW 76 kg  1.  pulmonary edema 2.  End-stage renal disease 3.  Anemia of chronic kidney disease 4.  Secondary hyperparathyroidism 5.  Ascites?  Cirrhosis of the liver 6.  Cardiac.  2D echo from May 2016.  LVEF 45 to 50%.  Moderate tricuspid regurgitation 7.  Chest pain.  Troponins negative this admission  We will plan on hemodialysis today for volume removal as tolerated.  Patient appears to be about 1.5 kg above her dry weight.  She is also having chest pain and is under cardiac evaluation.  Therefore we will try to remove volume carefully.  Continue Epogen on regular hemodialysis days.  Continue outpatient  regimen of cinacalcet and sevelamer.   LOS: 0 Maebell Lyvers 2/22/20201:38 PM  [place], [place]  Note: This note was prepared with Dragon dictation. Any transcription errors are unintentional

## 2018-08-07 DIAGNOSIS — E785 Hyperlipidemia, unspecified: Secondary | ICD-10-CM | POA: Diagnosis present

## 2018-08-07 DIAGNOSIS — Z79890 Hormone replacement therapy (postmenopausal): Secondary | ICD-10-CM | POA: Diagnosis not present

## 2018-08-07 DIAGNOSIS — W19XXXA Unspecified fall, initial encounter: Secondary | ICD-10-CM | POA: Diagnosis present

## 2018-08-07 DIAGNOSIS — S8012XA Contusion of left lower leg, initial encounter: Secondary | ICD-10-CM | POA: Diagnosis present

## 2018-08-07 DIAGNOSIS — I214 Non-ST elevation (NSTEMI) myocardial infarction: Secondary | ICD-10-CM | POA: Diagnosis present

## 2018-08-07 DIAGNOSIS — N186 End stage renal disease: Secondary | ICD-10-CM | POA: Diagnosis present

## 2018-08-07 DIAGNOSIS — F17210 Nicotine dependence, cigarettes, uncomplicated: Secondary | ICD-10-CM | POA: Diagnosis present

## 2018-08-07 DIAGNOSIS — E0590 Thyrotoxicosis, unspecified without thyrotoxic crisis or storm: Secondary | ICD-10-CM | POA: Diagnosis present

## 2018-08-07 DIAGNOSIS — K219 Gastro-esophageal reflux disease without esophagitis: Secondary | ICD-10-CM | POA: Diagnosis present

## 2018-08-07 DIAGNOSIS — R42 Dizziness and giddiness: Secondary | ICD-10-CM | POA: Diagnosis present

## 2018-08-07 DIAGNOSIS — Z992 Dependence on renal dialysis: Secondary | ICD-10-CM | POA: Diagnosis not present

## 2018-08-07 DIAGNOSIS — Z951 Presence of aortocoronary bypass graft: Secondary | ICD-10-CM | POA: Diagnosis not present

## 2018-08-07 DIAGNOSIS — I132 Hypertensive heart and chronic kidney disease with heart failure and with stage 5 chronic kidney disease, or end stage renal disease: Secondary | ICD-10-CM | POA: Diagnosis present

## 2018-08-07 DIAGNOSIS — N2581 Secondary hyperparathyroidism of renal origin: Secondary | ICD-10-CM | POA: Diagnosis present

## 2018-08-07 DIAGNOSIS — Z79899 Other long term (current) drug therapy: Secondary | ICD-10-CM | POA: Diagnosis not present

## 2018-08-07 DIAGNOSIS — I25110 Atherosclerotic heart disease of native coronary artery with unstable angina pectoris: Secondary | ICD-10-CM | POA: Diagnosis present

## 2018-08-07 DIAGNOSIS — I252 Old myocardial infarction: Secondary | ICD-10-CM | POA: Diagnosis not present

## 2018-08-07 DIAGNOSIS — Y92009 Unspecified place in unspecified non-institutional (private) residence as the place of occurrence of the external cause: Secondary | ICD-10-CM | POA: Diagnosis not present

## 2018-08-07 DIAGNOSIS — K7460 Unspecified cirrhosis of liver: Secondary | ICD-10-CM | POA: Diagnosis present

## 2018-08-07 DIAGNOSIS — I5022 Chronic systolic (congestive) heart failure: Secondary | ICD-10-CM | POA: Diagnosis present

## 2018-08-07 DIAGNOSIS — D631 Anemia in chronic kidney disease: Secondary | ICD-10-CM | POA: Diagnosis present

## 2018-08-07 DIAGNOSIS — R34 Anuria and oliguria: Secondary | ICD-10-CM | POA: Diagnosis present

## 2018-08-07 DIAGNOSIS — Z8673 Personal history of transient ischemic attack (TIA), and cerebral infarction without residual deficits: Secondary | ICD-10-CM | POA: Diagnosis not present

## 2018-08-07 DIAGNOSIS — Z7982 Long term (current) use of aspirin: Secondary | ICD-10-CM | POA: Diagnosis not present

## 2018-08-07 DIAGNOSIS — Z7901 Long term (current) use of anticoagulants: Secondary | ICD-10-CM | POA: Diagnosis not present

## 2018-08-07 DIAGNOSIS — I480 Paroxysmal atrial fibrillation: Secondary | ICD-10-CM | POA: Diagnosis present

## 2018-08-07 LAB — CBC
HCT: 36.3 % (ref 36.0–46.0)
HCT: 35.2 % — ABNORMAL LOW (ref 36.0–46.0)
HEMOGLOBIN: 11.1 g/dL — AB (ref 12.0–15.0)
Hemoglobin: 11 g/dL — ABNORMAL LOW (ref 12.0–15.0)
MCH: 31.1 pg (ref 26.0–34.0)
MCH: 31.8 pg (ref 26.0–34.0)
MCHC: 30.6 g/dL (ref 30.0–36.0)
MCHC: 31.3 g/dL (ref 30.0–36.0)
MCV: 101.7 fL — ABNORMAL HIGH (ref 80.0–100.0)
MCV: 101.7 fL — ABNORMAL HIGH (ref 80.0–100.0)
NRBC: 0 % (ref 0.0–0.2)
Platelets: 171 10*3/uL (ref 150–400)
Platelets: 171 10*3/uL (ref 150–400)
RBC: 3.57 MIL/uL — ABNORMAL LOW (ref 3.87–5.11)
RBC: 3.46 MIL/uL — AB (ref 3.87–5.11)
RDW: 16.9 % — ABNORMAL HIGH (ref 11.5–15.5)
RDW: 16.6 % — ABNORMAL HIGH (ref 11.5–15.5)
WBC: 4.1 10*3/uL (ref 4.0–10.5)
WBC: 3.6 10*3/uL — ABNORMAL LOW (ref 4.0–10.5)
nRBC: 0 % (ref 0.0–0.2)

## 2018-08-07 LAB — BASIC METABOLIC PANEL
Anion gap: 11 (ref 5–15)
Anion gap: 12 (ref 5–15)
BUN: 27 mg/dL — AB (ref 6–20)
BUN: 44 mg/dL — ABNORMAL HIGH (ref 6–20)
CHLORIDE: 97 mmol/L — AB (ref 98–111)
CO2: 27 mmol/L (ref 22–32)
CO2: 30 mmol/L (ref 22–32)
Calcium: 8.2 mg/dL — ABNORMAL LOW (ref 8.9–10.3)
Calcium: 8.2 mg/dL — ABNORMAL LOW (ref 8.9–10.3)
Chloride: 102 mmol/L (ref 98–111)
Creatinine, Ser: 4.16 mg/dL — ABNORMAL HIGH (ref 0.44–1.00)
Creatinine, Ser: 5.75 mg/dL — ABNORMAL HIGH (ref 0.44–1.00)
GFR calc Af Amer: 13 mL/min — ABNORMAL LOW (ref >60)
GFR calc Af Amer: 9 mL/min — ABNORMAL LOW (ref >60)
GFR calc non Af Amer: 11 mL/min — ABNORMAL LOW (ref >60)
GFR calc non Af Amer: 8 mL/min — ABNORMAL LOW (ref >60)
Glucose, Bld: 92 mg/dL (ref 70–99)
Glucose, Bld: 73 mg/dL (ref 70–99)
POTASSIUM: 4.1 mmol/L (ref 3.5–5.1)
Potassium: 4.2 mmol/L (ref 3.5–5.1)
Sodium: 140 mmol/L (ref 135–145)
Sodium: 139 mmol/L (ref 135–145)

## 2018-08-07 LAB — PROTIME-INR
INR: 1.14
Prothrombin Time: 14.5 seconds (ref 11.4–15.2)

## 2018-08-07 LAB — APTT
aPTT: 84 seconds — ABNORMAL HIGH (ref 24–36)
aPTT: 80 seconds — ABNORMAL HIGH (ref 24–36)

## 2018-08-07 LAB — HEPARIN LEVEL (UNFRACTIONATED): Heparin Unfractionated: 1.6 IU/mL — ABNORMAL HIGH (ref 0.30–0.70)

## 2018-08-07 MED ORDER — SODIUM CHLORIDE 0.9% FLUSH
10.0000 mL | Freq: Two times a day (BID) | INTRAVENOUS | Status: DC
  Administered 2018-08-07 – 2018-08-09 (×3): 10 mL via INTRAVENOUS

## 2018-08-07 MED ORDER — SODIUM CHLORIDE 0.9 % IV SOLN: 250.0000 mL | INTRAVENOUS | Status: DC | PRN

## 2018-08-07 MED ORDER — HYDROMORPHONE HCL 1 MG/ML IJ SOLN
0.2500 mg | INTRAMUSCULAR | Status: DC | PRN
  Administered 2018-08-07 – 2018-08-09 (×8): 0.25 mg via INTRAVENOUS
  Filled 2018-08-07 (×8): qty 1

## 2018-08-07 MED ORDER — LIDOCAINE-PRILOCAINE 2.5-2.5 % EX CREA
TOPICAL_CREAM | Freq: Every day | CUTANEOUS | Status: DC
  Administered 2018-08-08: 13:00:00 via TOPICAL
  Filled 2018-08-07: qty 5

## 2018-08-07 MED ORDER — SODIUM CHLORIDE 0.9% FLUSH
3.0000 mL | Freq: Two times a day (BID) | INTRAVENOUS | Status: DC
  Administered 2018-08-08: 3 mL via INTRAVENOUS

## 2018-08-07 MED ORDER — ASPIRIN 81 MG PO CHEW
81.0000 mg | CHEWABLE_TABLET | ORAL | Status: AC
  Administered 2018-08-08: 81 mg via ORAL
  Filled 2018-08-07: qty 1

## 2018-08-07 MED ORDER — SODIUM CHLORIDE 0.9 % IV SOLN
INTRAVENOUS | Status: DC
  Administered 2018-08-08: 06:00:00 via INTRAVENOUS

## 2018-08-07 MED ORDER — SODIUM CHLORIDE 0.9% FLUSH: 3.0000 mL | INTRAVENOUS | Status: DC | PRN

## 2018-08-07 NOTE — Plan of Care (Signed)
  Problem: Nutrition: Goal: Adequate nutrition will be maintained Outcome: Progressing   Problem: Pain Managment: Goal: General experience of comfort will improve Outcome: Progressing   

## 2018-08-07 NOTE — Progress Notes (Signed)
[facility] at [place] NAME: [name]    MR#:  [id number]  DATE OF BIRTH:  04/21/1961  SUBJECTIVE:  CHIEF COMPLAINT:   Chief Complaint  Patient presents with  . Fall  . Dizziness  Patient seen and evaluated today Chest pain is better Has been dialyzed yesterday Shortness of breath is better No dizziness  REVIEW OF SYSTEMS:    ROS  CONSTITUTIONAL: No documented fever. Has fatigue, weakness. No weight gain, no weight loss.  EYES: No blurry or double vision.  ENT: No tinnitus. No postnasal drip. No redness of the oropharynx.  RESPIRATORY: No cough, no wheeze, no hemoptysis.  Decreased dyspnea.  CARDIOVASCULAR: Has chest pain. No orthopnea. No palpitations. No syncope.  GASTROINTESTINAL: No nausea, no vomiting or diarrhea. No abdominal pain. No melena or hematochezia.  GENITOURINARY: No dysuria or hematuria.  ENDOCRINE: No polyuria or nocturia. No heat or cold intolerance.  HEMATOLOGY: No anemia. No bruising. No bleeding.  INTEGUMENTARY: No rashes. No lesions.  MUSCULOSKELETAL: No arthritis. No swelling. No gout.  NEUROLOGIC: No numbness, tingling, or ataxia. No seizure-type activity.  PSYCHIATRIC: No anxiety. No insomnia. No ADD.   DRUG ALLERGIES:   Allergies  Allergen Reactions  . Morphine And Related Hives  . Levaquin [Levofloxacin] Itching    Severe itching; prickly sensation  . Latex Rash  . Tape Rash    VITALS:  Blood pressure 112/61, pulse 62, temperature 98.4 F (36.9 C), temperature source Oral, resp. rate 19, height 5\' 6"  (1.676 m), weight 121.3 kg, SpO2 95 %.  PHYSICAL EXAMINATION:   Physical Exam  GENERAL:  58 y.o.-year-old patient lying in the bed with no acute distress.  EYES: Pupils equal, round, reactive to light and accommodation. No scleral icterus. Extraocular muscles intact.  HEENT: Head atraumatic, normocephalic. Oropharynx and nasopharynx clear.  NECK:  Supple, no jugular venous distention. No  thyroid enlargement, no tenderness.  LUNGS: Normal breath sounds bilaterally, no wheezing, rales, rhonchi. No use of accessory muscles of respiration.  CARDIOVASCULAR: S1, S2 normal. No murmurs, rubs, or gallops.  ABDOMEN: Soft, nontender, nondistended. Bowel sounds present. No organomegaly or mass.  EXTREMITIES: No cyanosis, clubbing or edema b/l.    NEUROLOGIC: Cranial nerves II through XII are intact. No focal Motor or sensory deficits b/l.   PSYCHIATRIC: The patient is alert and oriented x 3.  SKIN: No obvious rash, lesion, or ulcer.   LABORATORY PANEL:   CBC Recent Labs  Lab 08/07/18 0525  WBC 4.1  HGB 11.1*  HCT 36.3  PLT 171   ------------------------------------------------------------------------------------------------------------------ Chemistries  Recent Labs  Lab 08/07/18 0525  NA 140  K 4.2  CL 102  CO2 27  GLUCOSE 92  BUN 27*  CREATININE 4.16*  CALCIUM 8.2*   ------------------------------------------------------------------------------------------------------------------  Cardiac Enzymes Recent Labs  Lab 08/06/18 1500  TROPONINI 0.05*   ------------------------------------------------------------------------------------------------------------------  RADIOLOGY:  Dg Chest 1 View  Result Date: 08/06/2018 CLINICAL DATA:  58 y/o  F; chest pain after dialysis. EXAM: CHEST  1 VIEW COMPARISON:  06/01/2018 chest radiograph FINDINGS: Stable cardiomegaly given projection and technique. Post median sternotomy with wires in alignment. Diffuse reticular opacities compatible with interstitial pulmonary edema. No focal consolidation. No pleural effusion or pneumothorax. No acute osseous abnormality is evident. IMPRESSION: Interstitial pulmonary edema. Stable cardiomegaly. Electronically Signed   By: Kristine Garbe M.D.   On: 08/06/2018 00:15   Dg Tibia/fibula Left  Result Date: 08/05/2018 CLINICAL DATA:  58 year old female with fall and trauma to the left  lower extremity. EXAM: LEFT TIBIA AND FIBULA - 2 VIEW COMPARISON:  Left lower extremity radiograph dated 07/18/2015 FINDINGS: There is no acute fracture or dislocation. The bones are well mineralized. No arthritic changes. Vascular calcifications noted. There is a focal area of skin contusion or hematoma along the anterior shin. Surgical clips noted in the soft tissues of the calf. IMPRESSION: No acute fracture or dislocation. Electronically Signed   By: Anner Crete M.D.   On: 08/05/2018 23:52   Ct Head Wo Contrast  Result Date: 08/05/2018 CLINICAL DATA:  58 year old female with dizziness and fall. EXAM: CT HEAD WITHOUT CONTRAST TECHNIQUE: Contiguous axial images were obtained from the base of the skull through the vertex without intravenous contrast. COMPARISON:  Head CT dated 01/13/2016 and brain MRI dated 06/24/2014 FINDINGS: Brain: Scattered areas of old infarcts similar to prior CT, possibly sequela of embolic process. No acute intracranial hemorrhage. No mass effect or midline shift. No extra-axial fluid collection. Vascular: No hyperdense vessel or unexpected calcification. Skull: Normal. Negative for fracture or focal lesion. Sinuses/Orbits: No acute finding. Other: None IMPRESSION: 1. No acute intracranial hemorrhage. 2. Scattered areas of old infarcts similar to prior CT, possibly sequela of embolic process. Electronically Signed   By: Anner Crete M.D.   On: 08/05/2018 23:56     ASSESSMENT AND PLAN:   58 year old female patient with history of end-stage renal disease on dialysis, chronic congestive heart failure, hypertension, secondary hyperparathyroidism currently under hospitalist service for chest pain  -Non-STEMI IV heparin drip for anticoagulation Case has been discussed with Dr. Humphrey Rolls cardiology Recommends cardiac cath on Monday that is tomorrow for further evaluation We will keep patient n.p.o. from tonight Continue aspirin, beta-blocker  -Chronic congestive heart  failure appears stable Continue Entresto Continue beta-blocker Monitor input output chart and daily body weights  -End-stage renal disease Status post nephrology evaluation Dialysis today Continue Renvela  -Thyroid disease Synthroid supplements  -DVT prophylaxis On anticoagulation with IV heparin  All the records are reviewed and case discussed with Care Management/Social Worker. Management plans discussed with the patient, family and they are in agreement.  CODE STATUS: Full code  DVT Prophylaxis: SCDs  TOTAL TIME TAKING CARE OF THIS PATIENT: 35 minutes.   POSSIBLE D/C IN 2 to 3 DAYS, DEPENDING ON CLINICAL CONDITION.  Saundra Shelling M.D on 08/07/2018 at 12:23 PM  Between 7am to 6pm - Pager - [phone]  After 6pm go to [email or web address] - password [email or web address] [facility]  Office  [phone]  CC: Primary care physician; [name], MD  Note: This dictation was prepared with Dragon dictation along with smaller phrase technology. Any transcriptional errors that result from this process are unintentional.

## 2018-08-07 NOTE — Progress Notes (Signed)
ANTICOAGULATION CONSULT NOTE - Initial Consult  Pharmacy Consult for Heparin  Indication: chest pain/ACS  Allergies  Allergen Reactions  . Morphine And Related Hives  . Levaquin [Levofloxacin] Itching    Severe itching; prickly sensation  . Latex Rash  . Tape Rash    Patient Measurements: Height: 5\' 6"  (167.6 cm) Weight: 267 lb 6.4 oz (121.3 kg) IBW/kg (Calculated) : 59.3 Heparin Dosing Weight:  74 kg   Vital Signs: Temp: 98 F (36.7 C) (02/23 0519) Temp Source: Oral (02/23 0519) BP: 115/69 (02/23 0519) Pulse Rate: 90 (02/23 0519)  Labs: Recent Labs    08/05/18 2315 08/06/18 0418 08/06/18 0933 08/06/18 1201 08/06/18 1500 08/06/18 2121 08/07/18 0525  HGB 11.4*  --   --   --   --   --  11.1*  HCT 36.8  --   --   --   --   --  36.3  PLT 184  --   --   --   --   --  171  APTT  --   --   --  39*  --  58* 84*  LABPROT  --   --   --  18.1*  --   --   --   INR  --   --   --  1.52  --   --   --   HEPARINUNFRC  --   --   --   --   --   --  1.60*  CREATININE 4.24*  --   --   --   --   --  4.16*  TROPONINI 0.06* 0.06* 0.06*  --  0.05*  --   --     Estimated Creatinine Clearance: 20.3 mL/min (A) (by C-G formula based on SCr of 4.16 mg/dL (H)).   Medical History: Past Medical History:  Diagnosis Date  . CHF (congestive heart failure) ([place])   . Heart failure ([place])   . Hyperlipidemia   . Hypertension   . Myocardial infarction ([place])   . Paroxysmal atrial fibrillation (HCC)   . Renal failure   . Renal insufficiency   . Stroke [facility]) 2011    Medications:  Medications Prior to Admission  Medication Sig Dispense Refill Last Dose  . albuterol (PROVENTIL HFA;VENTOLIN HFA) 108 (90 Base) MCG/ACT inhaler Inhale 2 puffs into the lungs every 4 (four) hours as needed for wheezing or shortness of breath.   prn at prn  . aspirin 81 MG chewable tablet Chew 81 mg by mouth daily.   08/05/2018 at 1900  . carvedilol (COREG) 12.5 MG tablet Take 12.5 mg by mouth 2 (two) times daily with  a meal.    08/05/2018 at 1900  . cinacalcet (SENSIPAR) 60 MG tablet Take 60 mg by mouth every evening.    08/05/2018 at 1900  . digoxin (LANOXIN) 0.125 MG tablet Take 0.0625 mg by mouth daily.   08/05/2018 at 1100  . ELIQUIS 5 MG TABS tablet Take 1 tablet (5 mg total) by mouth 2 (two) times daily. 60 tablet 0 08/05/2018 at 1900  . hydrOXYzine (ATARAX/VISTARIL) 50 MG tablet Take 50 mg by mouth 2 (two) times daily as needed for itching.    prn at prn  . levothyroxine (SYNTHROID, LEVOTHROID) 88 MCG tablet Take 88 mcg by mouth at bedtime.    08/05/2018 at 1900  . midodrine (PROAMATINE) 10 MG tablet Take 10-20 mg by mouth daily. (for low BP associated with dialysis)   08/05/2018 at 0600  . sacubitril-valsartan (  ENTRESTO) 49-51 MG Take 1 tablet by mouth 2 (two) times daily.    08/05/2018 at 1900  . sevelamer carbonate (RENVELA) 800 MG tablet Take 4 tablets (3200MG ) by mouth 3 times daily with meals and 2 tablets (1600MG ) by mouth twice daily with snacks   08/05/2018 at 1900  . sucralfate (CARAFATE) 1 g tablet Take 1 g by mouth 2 (two) times daily.   08/05/2018 at 1900    Assessment: Pharmacy consulted for heparin drip dosing and monitoring in 58yo female for NSTEMI. Patient was taking apixaban 5mg  BID. Last dose @ 0928 today (2/22)   Goal of Therapy:  Heparin level 0.3-0.7 units/ml aPTT 66-102 seconds Monitor platelets by anticoagulation protocol: Yes   Plan:  Give reduced dose of 3000 units bolus x 1 Start heparin infusion at 850 units/hr Will need to adjust heparin infusion based on aPTT levels until aPTT and anti-Xa levels correlate.  Check aPTT level in 8 hours and anti-Xa and daily while on heparin Continue to monitor H&H and platelets  2/22:  APTT  @ 2121 = 58  Will increase heparin gtt rate to 950 units/hr and recheck aPTT 8 hrs after rate change on 2/23 @ 0500.  2/23 AM aPTT 84, heparin level 1.6. Continue current regimen. Recheck aPTT, HL, and CBC with tomorrow AM labs.   Sim Boast,  PharmD, BCPS  08/07/18 6:43 AM

## 2018-08-07 NOTE — Progress Notes (Signed)
ANTICOAGULATION CONSULT NOTE - Initial Consult  Pharmacy Consult for Heparin  Indication: chest pain/ACS  Allergies  Allergen Reactions  . Morphine And Related Hives  . Levaquin [Levofloxacin] Itching    Severe itching; prickly sensation  . Latex Rash  . Tape Rash    Patient Measurements: Height: 5\' 6"  (167.6 cm) Weight: 267 lb 6.4 oz (121.3 kg) IBW/kg (Calculated) : 59.3 Heparin Dosing Weight:  74 kg   Vital Signs: Temp: 98.4 F (36.9 C) (02/23 0733) Temp Source: Oral (02/23 0733) BP: 112/61 (02/23 0733) Pulse Rate: 62 (02/23 1000)  Labs: Recent Labs    08/05/18 2315 08/06/18 0418 08/06/18 0933  08/06/18 1201 08/06/18 1500 08/06/18 2121 08/07/18 0525 08/07/18 1357  HGB 11.4*  --   --   --   --   --   --  11.1*  --   HCT 36.8  --   --   --   --   --   --  36.3  --   PLT 184  --   --   --   --   --   --  171  --   APTT  --   --   --    < > 39*  --  58* 84* 80*  LABPROT  --   --   --   --  18.1*  --   --   --   --   INR  --   --   --   --  1.52  --   --   --   --   HEPARINUNFRC  --   --   --   --   --   --   --  1.60*  --   CREATININE 4.24*  --   --   --   --   --   --  4.16*  --   TROPONINI 0.06* 0.06* 0.06*  --   --  0.05*  --   --   --    < > = values in this interval not displayed.    Estimated Creatinine Clearance: 20.3 mL/min (A) (by C-G formula based on SCr of 4.16 mg/dL (H)).   Medical History: Past Medical History:  Diagnosis Date  . CHF (congestive heart failure) ([place])   . Heart failure ([place])   . Hyperlipidemia   . Hypertension   . Myocardial infarction ([place])   . Paroxysmal atrial fibrillation (HCC)   . Renal failure   . Renal insufficiency   . Stroke [facility]) 2011    Medications:  Medications Prior to Admission  Medication Sig Dispense Refill Last Dose  . albuterol (PROVENTIL HFA;VENTOLIN HFA) 108 (90 Base) MCG/ACT inhaler Inhale 2 puffs into the lungs every 4 (four) hours as needed for wheezing or shortness of breath.   prn at prn  .  aspirin 81 MG chewable tablet Chew 81 mg by mouth daily.   08/05/2018 at 1900  . carvedilol (COREG) 12.5 MG tablet Take 12.5 mg by mouth 2 (two) times daily with a meal.    08/05/2018 at 1900  . cinacalcet (SENSIPAR) 60 MG tablet Take 60 mg by mouth every evening.    08/05/2018 at 1900  . digoxin (LANOXIN) 0.125 MG tablet Take 0.0625 mg by mouth daily.   08/05/2018 at 1100  . ELIQUIS 5 MG TABS tablet Take 1 tablet (5 mg total) by mouth 2 (two) times daily. 60 tablet 0 08/05/2018 at 1900  . hydrOXYzine (ATARAX/VISTARIL) 50 MG tablet  Take 50 mg by mouth 2 (two) times daily as needed for itching.    prn at prn  . levothyroxine (SYNTHROID, LEVOTHROID) 88 MCG tablet Take 88 mcg by mouth at bedtime.    08/05/2018 at 1900  . midodrine (PROAMATINE) 10 MG tablet Take 10-20 mg by mouth daily. (for low BP associated with dialysis)   08/05/2018 at 0600  . sacubitril-valsartan (ENTRESTO) 49-51 MG Take 1 tablet by mouth 2 (two) times daily.    08/05/2018 at 1900  . sevelamer carbonate (RENVELA) 800 MG tablet Take 4 tablets (3200MG ) by mouth 3 times daily with meals and 2 tablets (1600MG ) by mouth twice daily with snacks   08/05/2018 at 1900  . sucralfate (CARAFATE) 1 g tablet Take 1 g by mouth 2 (two) times daily.   08/05/2018 at 1900    Assessment: Pharmacy consulted for heparin drip dosing and monitoring in 58yo female for NSTEMI. Patient was taking apixaban 5mg  BID. Last dose @ 0928 today (2/22)   2/22 heparin infusion started @ 850 units/hr 2/22 @ 2121 =58. Infusion increased to 950 units/hr. 2/23 AM aPTT: 84, HL 1.60 2/23 @ 1357 aPTT 80    Goal of Therapy:  Heparin level 0.3-0.7 units/ml aPTT 66-102 seconds Monitor platelets by anticoagulation protocol: Yes   Plan:  2/23 @ 1357 aPTT 80. Level now therapeutic x 2. Will continue current infusion rate of 950 units/hr. Recheck aPTT, HL, CBC with AM labs per protocol. Will continue to dose adjust based on aPTT levels until aPTT and HL correlate.   Pernell Dupre, PharmD, BCPS Clinical Pharmacist 08/07/2018 2:57 PM

## 2018-08-07 NOTE — Progress Notes (Signed)
Patient is complaining of chest pain. Called Dr. Jannifer Franklin and received new order for dilaudid.

## 2018-08-07 NOTE — Progress Notes (Signed)
SUBJECTIVE: Patient denies any syncope but has still pressure type chest pain in the center of her chest.   Vitals:   08/06/18 1916 08/07/18 0131 08/07/18 0519 08/07/18 0733  BP: 92/67 116/71 115/69 112/61  Pulse: 65 71 90 61  Resp: 18 20 18 19   Temp: 98.6 F (37 C) 98.9 F (37.2 C) 98 F (36.7 C) 98.4 F (36.9 C)  TempSrc: Oral Oral Oral Oral  SpO2: 100% 100% 92% 95%  Weight:   121.3 kg   Height:        Intake/Output Summary (Last 24 hours) at 08/07/2018 1043 Last data filed at 08/06/2018 1828 Gross per 24 hour  Intake 240.09 ml  Output 1500 ml  Net -1259.91 ml    LABS: Basic Metabolic Panel: Recent Labs    08/05/18 2315 08/06/18 1500 08/07/18 0525  NA 141  --  140  K 4.4  --  4.2  CL 104  --  102  CO2 30  --  27  GLUCOSE 92  --  92  BUN 17  --  27*  CREATININE 4.24*  --  4.16*  CALCIUM 8.1*  --  8.2*  PHOS  --  2.2*  --    Liver Function Tests: No results for input(s): AST, ALT, ALKPHOS, BILITOT, PROT, ALBUMIN in the last 72 hours. No results for input(s): LIPASE, AMYLASE in the last 72 hours. CBC: Recent Labs    08/05/18 2315 08/07/18 0525  WBC 3.9* 4.1  HGB 11.4* 11.1*  HCT 36.8 36.3  MCV 102.5* 101.7*  PLT 184 171   Cardiac Enzymes: Recent Labs    08/06/18 0418 08/06/18 0933 08/06/18 1500  TROPONINI 0.06* 0.06* 0.05*   BNP: Invalid input(s): POCBNP D-Dimer: No results for input(s): DDIMER in the last 72 hours. Hemoglobin A1C: No results for input(s): HGBA1C in the last 72 hours. Fasting Lipid Panel: No results for input(s): CHOL, HDL, LDLCALC, TRIG, CHOLHDL, LDLDIRECT in the last 72 hours. Thyroid Function Tests: Recent Labs    08/06/18 0418  TSH 0.101*   Anemia Panel: No results for input(s): VITAMINB12, FOLATE, FERRITIN, TIBC, IRON, RETICCTPCT in the last 72 hours.   PHYSICAL EXAM General: Well developed, well nourished, in no acute distress HEENT:  Normocephalic and atramatic Neck:  No JVD.  Lungs: Clear bilaterally to  auscultation and percussion. Heart: HRRR . Normal S1 and S2 without gallops or murmurs.  Abdomen: Bowel sounds are positive, abdomen soft and non-tender  Msk:  Back normal, normal gait. Normal strength and tone for age. Extremities: No clubbing, cyanosis or edema.   Neuro: Alert and oriented X 3. Psych:  Good affect, responds appropriately  TELEMETRY: Normal sinus rhythm  ASSESSMENT AND PLAN: Unstable angina with mildly elevated troponin and renal failure.  Plan ongoing left heart catheterization tomorrow.  Active Problems:   Chest pain   Non-STEMI (non-ST elevated myocardial infarction) ([place])    Neoma Laming A, MD, [facility] 08/07/2018 10:43 AM

## 2018-08-07 NOTE — Progress Notes (Signed)
[place], [place] 08/07/18  Subjective:   Patient known to our practice from outpatient dialysis.  She underwent her hemodialysis yesterday She has EDW of 76 kg. Started treatment with 79.2 kg and discharged at 77.2 kg Reports feeling dizzy at home and fell down.  Has a hematoma on her left shin. She also reports sharp chest pain in the midsternal region Currently being admitted for observation.  Her chest x-ray shows pulmonary vascular congestion and mild pulmonary edema.  Patient requires oxygen by nasal cannula at baseline 3 L/min.  No increased requirement of oxygen but patient feels shortness of breath  Patient underwent hemodialysis on Saturday.  1500 cc of fluid was removed  Objective:  Vital signs in last 24 hours:  Temp:  [98 F (36.7 C)-98.9 F (37.2 C)] 98.6 F (37 C) (02/23 1623) Pulse Rate:  [56-90] 61 (02/23 1623) Resp:  [12-20] 19 (02/23 1623) BP: (90-118)/(50-74) 118/65 (02/23 1623) SpO2:  [92 %-100 %] 100 % (02/23 1623) Weight:  [76.9 kg-121.3 kg] 121.3 kg (02/23 0519)  Weight change: 0.696 kg Filed Weights   08/06/18 0332 08/06/18 1855 08/07/18 0519  Weight: 77.5 kg 76.9 kg 121.3 kg    Intake/Output:    Intake/Output Summary (Last 24 hours) at 08/07/2018 1644 Last data filed at 08/07/2018 1100 Gross per 24 hour  Intake 644.16 ml  Output 1500 ml  Net -855.84 ml     Physical Exam: General:  No acute distress, lying in the bed  HEENT  anicteric, moist oral mucous membrane  Neck  supple  Pulm/lungs  clear to auscultation  CVS/Heart  regular, no rub  Abdomen:   Soft, nontender  Extremities:  No peripheral edema,  Neurologic:  Alert, oriented  Skin:  left shin hematoma  Access:  Left upper arm AV fistula, with good bruit and thrill       Basic Metabolic Panel:  Recent Labs  Lab 08/05/18 2315 08/06/18 1500 08/07/18 0525  NA 141  --  140  K 4.4  --  4.2  CL 104  --  102  CO2 30  --  27  GLUCOSE 92  --  92  BUN  17  --  27*  CREATININE 4.24*  --  4.16*  CALCIUM 8.1*  --  8.2*  PHOS  --  2.2*  --      CBC: Recent Labs  Lab 08/05/18 2315 08/07/18 0525  WBC 3.9* 4.1  HGB 11.4* 11.1*  HCT 36.8 36.3  MCV 102.5* 101.7*  PLT 184 171      Lab Results  Component Value Date   HEPBSAG Negative 06/02/2018      Microbiology:  Recent Results (from the past 240 hour(s))  MRSA PCR Screening     Status: Abnormal   Collection Time: 08/06/18  4:29 AM  Result Value Ref Range Status   MRSA by PCR POSITIVE (A) NEGATIVE Final    Comment:        The GeneXpert MRSA Assay (FDA approved for NASAL specimens only), is one component of a comprehensive MRSA colonization surveillance program. It is not intended to diagnose MRSA infection nor to guide or monitor treatment for MRSA infections. RESULT CALLED TO, READ BACK BY AND VERIFIED WITH: KAT MURRAY 08/06/18 @ 0786  [place] Performed at [facility], [address] [place], [place] [address]     Coagulation Studies: Recent Labs    08/06/18 1201  LABPROT 18.1*  INR 1.52    Urinalysis: No results for  input(s): COLORURINE, LABSPEC, PHURINE, GLUCOSEU, HGBUR, BILIRUBINUR, KETONESUR, PROTEINUR, UROBILINOGEN, NITRITE, LEUKOCYTESUR in the last 72 hours.  Invalid input(s): APPERANCEUR    Imaging: Dg Chest 1 View  Result Date: 08/06/2018 CLINICAL DATA:  58 y/o  F; chest pain after dialysis. EXAM: CHEST  1 VIEW COMPARISON:  06/01/2018 chest radiograph FINDINGS: Stable cardiomegaly given projection and technique. Post median sternotomy with wires in alignment. Diffuse reticular opacities compatible with interstitial pulmonary edema. No focal consolidation. No pleural effusion or pneumothorax. No acute osseous abnormality is evident. IMPRESSION: Interstitial pulmonary edema. Stable cardiomegaly. Electronically Signed   By: Kristine Garbe M.D.   On: 08/06/2018 00:15   Dg Tibia/fibula Left  Result Date: 08/05/2018 CLINICAL DATA:   58 year old female with fall and trauma to the left lower extremity. EXAM: LEFT TIBIA AND FIBULA - 2 VIEW COMPARISON:  Left lower extremity radiograph dated 07/18/2015 FINDINGS: There is no acute fracture or dislocation. The bones are well mineralized. No arthritic changes. Vascular calcifications noted. There is a focal area of skin contusion or hematoma along the anterior shin. Surgical clips noted in the soft tissues of the calf. IMPRESSION: No acute fracture or dislocation. Electronically Signed   By: Anner Crete M.D.   On: 08/05/2018 23:52   Ct Head Wo Contrast  Result Date: 08/05/2018 CLINICAL DATA:  58 year old female with dizziness and fall. EXAM: CT HEAD WITHOUT CONTRAST TECHNIQUE: Contiguous axial images were obtained from the base of the skull through the vertex without intravenous contrast. COMPARISON:  Head CT dated 01/13/2016 and brain MRI dated 06/24/2014 FINDINGS: Brain: Scattered areas of old infarcts similar to prior CT, possibly sequela of embolic process. No acute intracranial hemorrhage. No mass effect or midline shift. No extra-axial fluid collection. Vascular: No hyperdense vessel or unexpected calcification. Skull: Normal. Negative for fracture or focal lesion. Sinuses/Orbits: No acute finding. Other: None IMPRESSION: 1. No acute intracranial hemorrhage. 2. Scattered areas of old infarcts similar to prior CT, possibly sequela of embolic process. Electronically Signed   By: Anner Crete M.D.   On: 08/05/2018 23:56     Medications:   . heparin 950 Units/hr (08/07/18 1238)   . aspirin  81 mg Oral Daily  . carvedilol  12.5 mg Oral BID WC  . Chlorhexidine Gluconate Cloth  6 each Topical Q0600  . cinacalcet  60 mg Oral QPM  . digoxin  0.0625 mg Oral Daily  . docusate sodium  100 mg Oral BID  . levothyroxine  88 mcg Oral QHS  . lidocaine-prilocaine   Topical Q1500  . midodrine  10-20 mg Oral Daily  . sacubitril-valsartan  1 tablet Oral BID  . sevelamer carbonate  1,600  mg Oral TID WC  . sucralfate  1 g Oral BID   acetaminophen **OR** acetaminophen, albuterol, HYDROmorphone (DILAUDID) injection, hydrOXYzine, ondansetron **OR** ondansetron (ZOFRAN) IV, oxyCODONE-acetaminophen  Assessment/ Plan:  58 y.o. African-American female withESRD on HD MWF, hypertension, congestive heart failure, anemia of CKD, SHPTH, myocardial infarction, hx of CVA, prior episodes of hyperkalemia,pulmonary embolism8/2019, ascites, nodular hepatic contour noted on CT December 2019  [facility]/ [facility]/MWF/EDW 76 kg  1.  pulmonary edema 2.  End-stage renal disease 3.  Anemia of chronic kidney disease 4.  Secondary hyperparathyroidism 5.  Ascites?  Cirrhosis of the liver 6.  Cardiac.  2D echo from May 2016.  LVEF 45 to 50%.  Moderate tricuspid regurgitation 7.  Chest pain.    1.5 kg of fluid was removed with hemodialysis on Saturday. Patient continues to have ongoing chest pain.  She was evaluated by cardiologist and a left heart catheterization is planned for Monday.  We will plan to dialyze patient after her heart catheterization Continue phosphorus binding agents and cinacalcet at home dose.  Hemoglobin 11.1.  Continue to monitor   LOS: 0 Mayling Aber Candiss Norse 2/23/20204:44 PM  [place], [place]  Note: This note was prepared with Dragon dictation. Any transcription errors are unintentional

## 2018-08-08 ENCOUNTER — Inpatient Hospital Stay
Admission: EM | Disposition: A | Discharge status: Home / Self Care | Payer: Self-pay | Source: Home / Self Care | Attending: Internal Medicine

## 2018-08-08 HISTORY — PX: LEFT HEART CATH AND CORS/GRAFTS ANGIOGRAPHY: CATH118250

## 2018-08-08 LAB — CBC
HCT: 33.3 % — ABNORMAL LOW (ref 36.0–46.0)
HEMOGLOBIN: 10.4 g/dL — AB (ref 12.0–15.0)
MCH: 31.6 pg (ref 26.0–34.0)
MCHC: 31.2 g/dL (ref 30.0–36.0)
MCV: 101.2 fL — ABNORMAL HIGH (ref 80.0–100.0)
Platelets: 167 10*3/uL (ref 150–400)
RBC: 3.29 MIL/uL — ABNORMAL LOW (ref 3.87–5.11)
RDW: 16.9 % — ABNORMAL HIGH (ref 11.5–15.5)
WBC: 3.5 10*3/uL — ABNORMAL LOW (ref 4.0–10.5)
nRBC: 0 % (ref 0.0–0.2)

## 2018-08-08 LAB — BASIC METABOLIC PANEL
Anion gap: 12 (ref 5–15)
BUN: 52 mg/dL — ABNORMAL HIGH (ref 6–20)
CO2: 27 mmol/L (ref 22–32)
Calcium: 8.3 mg/dL — ABNORMAL LOW (ref 8.9–10.3)
Chloride: 99 mmol/L (ref 98–111)
Creatinine, Ser: 6.38 mg/dL — ABNORMAL HIGH (ref 0.44–1.00)
GFR calc Af Amer: 8 mL/min — ABNORMAL LOW (ref >60)
GFR calc non Af Amer: 7 mL/min — ABNORMAL LOW (ref >60)
Glucose, Bld: 83 mg/dL (ref 70–99)
Potassium: 4.3 mmol/L (ref 3.5–5.1)
SODIUM: 138 mmol/L (ref 135–145)

## 2018-08-08 LAB — APTT: APTT: 92 s — AB (ref 24–36)

## 2018-08-08 LAB — HEPATITIS B SURFACE ANTIGEN: Hepatitis B Surface Ag: NEGATIVE

## 2018-08-08 LAB — HEPARIN LEVEL (UNFRACTIONATED): Heparin Unfractionated: 0.6 IU/mL (ref 0.30–0.70)

## 2018-08-08 SURGERY — LEFT HEART CATH AND CORS/GRAFTS ANGIOGRAPHY
Anesthesia: Moderate Sedation | Laterality: Right

## 2018-08-08 MED ORDER — SODIUM CHLORIDE 0.9% FLUSH: 3.0000 mL | INTRAVENOUS | Status: DC | PRN

## 2018-08-08 MED ORDER — SODIUM CHLORIDE 0.9% FLUSH
3.0000 mL | Freq: Two times a day (BID) | INTRAVENOUS | Status: DC
  Administered 2018-08-09: 3 mL via INTRAVENOUS

## 2018-08-08 MED ORDER — IOPAMIDOL (ISOVUE-300) INJECTION 61%
INTRAVENOUS | Status: DC | PRN
  Administered 2018-08-08: 150 mL via INTRA_ARTERIAL

## 2018-08-08 MED ORDER — ISOSORBIDE MONONITRATE ER 30 MG PO TB24: 30.0000 mg | ORAL_TABLET | Freq: Every day | ORAL | 0 refills | Status: DC

## 2018-08-08 MED ORDER — FENTANYL CITRATE (PF) 100 MCG/2ML IJ SOLN
INTRAMUSCULAR | Status: AC
  Filled 2018-08-08: qty 2

## 2018-08-08 MED ORDER — ONDANSETRON HCL 4 MG/2ML IJ SOLN
4.0000 mg | Freq: Four times a day (QID) | INTRAMUSCULAR | Status: DC | PRN
  Administered 2018-08-08: 4 mg via INTRAVENOUS

## 2018-08-08 MED ORDER — SODIUM CHLORIDE 0.9 % IV SOLN: 250.0000 mL | INTRAVENOUS | Status: DC | PRN

## 2018-08-08 MED ORDER — FENTANYL CITRATE (PF) 100 MCG/2ML IJ SOLN
INTRAMUSCULAR | Status: DC | PRN
  Administered 2018-08-08 (×2): 25 ug via INTRAVENOUS

## 2018-08-08 MED ORDER — ACETAMINOPHEN 325 MG PO TABS: 650.0000 mg | ORAL_TABLET | ORAL | Status: DC | PRN

## 2018-08-08 MED ORDER — HEPARIN (PORCINE) IN NACL 1000-0.9 UT/500ML-% IV SOLN
INTRAVENOUS | Status: AC
  Filled 2018-08-08: qty 1000

## 2018-08-08 MED ORDER — SODIUM CHLORIDE 0.9 % WEIGHT BASED INFUSION: 1.0000 mL/kg/h | INTRAVENOUS | Status: DC

## 2018-08-08 MED ORDER — MIDAZOLAM HCL 2 MG/2ML IJ SOLN
INTRAMUSCULAR | Status: DC | PRN
  Administered 2018-08-08: 0.5 mg via INTRAVENOUS
  Administered 2018-08-08: 1 mg via INTRAVENOUS

## 2018-08-08 MED ORDER — MIDAZOLAM HCL 2 MG/2ML IJ SOLN
INTRAMUSCULAR | Status: AC
  Filled 2018-08-08: qty 2

## 2018-08-08 SURGICAL SUPPLY — 16 items
CATH ANGIO 5F JB2 100CM (CATHETERS) ×1 IMPLANT
CATH INFINITI 5 FR 3DRC (CATHETERS) ×1 IMPLANT
CATH INFINITI 5 FR IM (CATHETERS) ×1 IMPLANT
CATH INFINITI 5 FR MPA2 (CATHETERS) ×1 IMPLANT
CATH INFINITI 5 FR RCB (CATHETERS) ×1 IMPLANT
CATH INFINITI 5FR ANG PIGTAIL (CATHETERS) ×1 IMPLANT
CATH INFINITI 5FR JL4 (CATHETERS) ×1 IMPLANT
CATH INFINITI JR4 5F (CATHETERS) ×1 IMPLANT
DEVICE CLOSURE MYNXGRIP 5F (Vascular Products) ×1 IMPLANT
KIT MANI 3VAL PERCEP (MISCELLANEOUS) ×3 IMPLANT
NDL PERC 18GX7CM (NEEDLE) IMPLANT
NEEDLE PERC 18GX7CM (NEEDLE) ×3 IMPLANT
PACK CARDIAC CATH (CUSTOM PROCEDURE TRAY) ×3 IMPLANT
SHEATH AVANTI 5FR X 11CM (SHEATH) ×1 IMPLANT
WIRE EMERALD 3MM-J .035X260CM (WIRE) ×1 IMPLANT
WIRE GUIDERIGHT .035X150 (WIRE) ×1 IMPLANT

## 2018-08-08 NOTE — Progress Notes (Signed)
Pre HD assessment    08/08/18 1421  Vital Signs  Temp 98.2 F (36.8 C)  Temp Source Oral  Pulse Rate (!) 57  Pulse Rate Source Monitor  Resp 13  BP (!) 109/58  BP Location Right Arm  BP Method Automatic  Patient Position (if appropriate) Lying  Oxygen Therapy  SpO2 100 %  O2 Device Nasal Cannula  O2 Flow Rate (L/min) 3 L/min  Pain Assessment  Pain Scale 0-10  Pain Score 0  Dialysis Weight  Weight 89.8 kg  Type of Weight Pre-Dialysis  Time-Out for Hemodialysis  What Procedure? HD  Pt Identifiers(min of two) First/Last Name;MRN/Account#  Correct Site? Yes  Correct Side? Yes  Correct Procedure? Yes  Consents Verified? Yes  Rad Studies Available? N/A  Safety Precautions Reviewed? Yes  Engineer, civil (consulting) Number  (4A)  Station Number 4  UF/Alarm Test Passed  Conductivity: Meter 14.2  Conductivity: Machine  14  pH 7.4  Reverse Osmosis main  Normal Saline Lot Number [id number]  Dialyzer Lot Number [id number]  Disposable Set Lot Number [id number]  Machine Temperature 98.6 F (37 C)  Musician and Audible Yes  Blood Lines Intact and Secured Yes  Pre Treatment Patient Checks  Vascular access used during treatment Graft  Hepatitis B Surface Antigen Results Negative (unk )  Date Hepatitis B Surface Antigen Drawn 06/02/18  Isolation Initiated Yes  Hepatitis B Surface Antibody  (unk/<10)  Date Hepatitis B Surface Antibody Drawn 06/02/18  Hemodialysis Consent Verified Yes  Hemodialysis Standing Orders Initiated Yes  ECG (Telemetry) Monitor On Yes  Prime Ordered Normal Saline  Length of  DialysisTreatment -hour(s) 3 Hour(s)  Dialyzer [place] 17H NR  Dialysate 3K, 2.5 Ca  Dialysis Anticoagulant None  Dialysate Flow Ordered 800  Blood Flow Rate Ordered 400 mL/min  Ultrafiltration Goal 1.5 Liters  Pre Treatment Labs Hepatitis B Surface Antigen  Dialysis Blood Pressure Support Ordered Normal Saline  Education / Care Plan  Dialysis Education Provided Yes   Documented Education in Care Plan Yes  Fistula / Graft Left Upper arm Arteriovenous vein graft  No Placement Date or Time found.   Placed prior to admission: Yes  Orientation: Left  Access Location: Upper arm  Access Type: Arteriovenous vein graft  Site Condition No complications  Fistula / Graft Assessment Present;Thrill;Bruit  Drainage Description None

## 2018-08-08 NOTE — Discharge Summary (Signed)
[facility] [place] NAME: [name]    MR#:  [id number]  DATE OF BIRTH:  1960-12-09  DATE OF ADMISSION:  08/05/2018 ADMITTING PHYSICIAN: Harrie Foreman, MD  DATE OF DISCHARGE: 08/08/2018  PRIMARY CARE PHYSICIAN: [name], MD   ADMISSION DIAGNOSIS:  Dizziness [R42] Lightheadedness [R42] Hematoma [T14.8XXA] Elevated troponin [R79.89] Fall, initial encounter [W19.XXXA] Chest pain, unspecified type [R07.9] Stage 5 chronic kidney disease on chronic dialysis ([place]) [N18.6, Z99.2]  DISCHARGE DIAGNOSIS:  Active Problems:   Chest pain   Non-STEMI (non-ST elevated myocardial infarction) ([place]) End-stage renal disease on dialysis Chronic systolic heart failure  SECONDARY DIAGNOSIS:   Past Medical History:  Diagnosis Date  . CHF (congestive heart failure) ([place])   . Heart failure ([place])   . Hyperlipidemia   . Hypertension   . Myocardial infarction ([place])   . Paroxysmal atrial fibrillation (H[facility])   . Renal failure   . Renal insufficiency   . Stroke [facility]) 2011     ADMITTING HISTORY The patient with past medical history of end-stage renal disease on dialysis, atrial fibrillation, CAD and hypertension presents to the emergency department complaining of chest pain.  Her pain began after she became dizzy while walking through her home.  She fell at the beginning of this episode and bruised her left leg severely.  She admits to feeling very hot but not diaphoretic at that time.  Her chest pain was located over her left breast and radiated into her left neck.  She admits to mild shortness of breath as well as nausea associated with chest pain.  Her chest still hurts despite receiving 4 chewable baby aspirin in the emergency department.  Troponin was found to be elevated at 0.06 but telemetry showed no indication of ischemia.  Due to ongoing chest pain the emergency department staff called the hospitalist service for admission.   HOSPITAL  COURSE:  Patient was admitted to telemetry.  She was anticoagulated with IV heparin drip.  Serial troponins were cycled.  Patient continued oral beta-blocker and Entresto for chronic congestive heart failure.  She was dialyzed by nephrology for ESRD.  Cardiology consultation was done by Dr Humphrey Rolls.  Cardiology attending recommended cardiac cath for further intervention and evaluation.  Cardiac cath revealed three-vessel disease with bypass graft to the LIMA/LAD and SVG to PDA and distal left circumflex patent.  Advised medical management with isosorbide 30 mg daily and follow-up in the clinic.  Patient will be discharged home today after dialysis.  CONSULTS OBTAINED:  Treatment Team:  Murlean Iba, MD Dionisio David, MD  DRUG ALLERGIES:   Allergies  Allergen Reactions  . Morphine And Related Hives  . Levaquin [Levofloxacin] Itching    Severe itching; prickly sensation  . Latex Rash  . Tape Rash    DISCHARGE MEDICATIONS:   Allergies as of 08/08/2018      Reactions   Morphine And Related Hives   Levaquin [levofloxacin] Itching   Severe itching; prickly sensation   Latex Rash   Tape Rash      Medication List    TAKE these medications   albuterol 108 (90 Base) MCG/ACT inhaler Commonly known as:  PROVENTIL HFA;VENTOLIN HFA Inhale 2 puffs into the lungs every 4 (four) hours as needed for wheezing or shortness of breath.   aspirin 81 MG chewable tablet Chew 81 mg by mouth daily.   carvedilol 12.5 MG tablet Commonly known as:  COREG Take 12.5 mg by mouth 2 (two)  times daily with a meal.   digoxin 0.125 MG tablet Commonly known as:  LANOXIN Take 0.0625 mg by mouth daily.   ELIQUIS 5 MG Tabs tablet Generic drug:  apixaban Take 1 tablet (5 mg total) by mouth 2 (two) times daily.   ENTRESTO 49-51 MG Generic drug:  sacubitril-valsartan Take 1 tablet by mouth 2 (two) times daily.   hydrOXYzine 50 MG tablet Commonly known as:  ATARAX/VISTARIL Take 50 mg by mouth 2 (two)  times daily as needed for itching.   isosorbide mononitrate 30 MG 24 hr tablet Commonly known as:  IMDUR Take 1 tablet (30 mg total) by mouth daily for 30 days.   levothyroxine 88 MCG tablet Commonly known as:  SYNTHROID, LEVOTHROID Take 88 mcg by mouth at bedtime.   midodrine 10 MG tablet Commonly known as:  PROAMATINE Take 10-20 mg by mouth daily. (for low BP associated with dialysis)   SENSIPAR 60 MG tablet Generic drug:  cinacalcet Take 60 mg by mouth every evening.   sevelamer carbonate 800 MG tablet Commonly known as:  RENVELA Take 4 tablets (3200MG ) by mouth 3 times daily with meals and 2 tablets (1600MG ) by mouth twice daily with snacks   sucralfate 1 g tablet Commonly known as:  CARAFATE Take 1 g by mouth 2 (two) times daily.       Today  Patient seen today No complaints of chest pain No shortness of breath Tolerating diet okay Had cardiac catheterization and successfully completed the procedure Patient hemodynamically stable  VITAL SIGNS:  Blood pressure 113/72, pulse 75, temperature 97.6 F (36.4 C), temperature source Oral, resp. rate 15, height 5\' 6"  (1.676 m), weight 78.3 kg, SpO2 100 %.  I/O:    Intake/Output Summary (Last 24 hours) at 08/08/2018 1300 Last data filed at 08/08/2018 1227 Gross per 24 hour  Intake 175.21 ml  Output 0 ml  Net 175.21 ml    PHYSICAL EXAMINATION:  Physical Exam  GENERAL:  58 y.o.-year-old patient lying in the bed with no acute distress.  LUNGS: Normal breath sounds bilaterally, no wheezing, rales,rhonchi or crepitation. No use of accessory muscles of respiration.  CARDIOVASCULAR: S1, S2 normal. No murmurs, rubs, or gallops.  ABDOMEN: Soft, non-tender, non-distended. Bowel sounds present. No organomegaly or mass.  NEUROLOGIC: Moves all 4 extremities. PSYCHIATRIC: The patient is alert and oriented x 3.  SKIN: No obvious rash, lesion, or ulcer.   DATA REVIEW:   CBC Recent Labs  Lab 08/08/18 0637  WBC 3.5*  HGB  10.4*  HCT 33.3*  PLT 167    Chemistries  Recent Labs  Lab 08/08/18 0637  NA 138  K 4.3  CL 99  CO2 27  GLUCOSE 83  BUN 52*  CREATININE 6.38*  CALCIUM 8.3*    Cardiac Enzymes Recent Labs  Lab 08/06/18 1500  TROPONINI 0.05*    Microbiology Results  Results for orders placed or performed during the hospital encounter of 08/05/18  MRSA PCR Screening     Status: Abnormal   Collection Time: 08/06/18  4:29 AM  Result Value Ref Range Status   MRSA by PCR POSITIVE (A) NEGATIVE Final    Comment:        The GeneXpert MRSA Assay (FDA approved for NASAL specimens only), is one component of a comprehensive MRSA colonization surveillance program. It is not intended to diagnose MRSA infection nor to guide or monitor treatment for MRSA infections. RESULT CALLED TO, READ BACK BY AND VERIFIED WITH: KAT MURRAY 08/06/18 @ 0546  [facility] Performed at [facility], [address], [place], [place] [address]     RADIOLOGY:  No results found.  Follow up with PCP in 1 week.  Management plans discussed with the patient, family and they are in agreement.  CODE STATUS: Full code    Code Status Orders  (From admission, onward)         Start     Ordered   08/06/18 0345  Full code  Continuous     08/06/18 0344        Code Status History    Date Active Date Inactive Code Status Order ID Comments User Context   06/02/2018 0039 06/04/2018 2144 Full Code [id number]  Arta Silence, MD Inpatient   04/21/2018 1808 04/22/2018 2210 Full Code [id number]  Dustin Flock, MD Inpatient   01/31/2018 1924 02/03/2018 0111 Full Code [id number]  Loletha Grayer, MD ED   11/13/2014 1659 11/15/2014 2152 Full Code [id number]  Hillary Bow, MD ED   10/16/2014 0823 10/17/2014 2333 Full Code [id number]  Lance Coon, MD Inpatient      TOTAL TIME TAKING CARE OF THIS PATIENT ON DAY OF DISCHARGE: more than 35 minutes.   Saundra Shelling M.D on 08/08/2018 at 1:00 PM  Between 7am to 6pm - Pager -  [phone]  After 6pm go to [email or web address] - password [email or web address] [place] [facility]  Office  [phone]  CC: Primary care physician; [name], MD  Note: This dictation was prepared with Dragon dictation along with smaller phrase technology. Any transcriptional errors that result from this process are unintentional.

## 2018-08-08 NOTE — Progress Notes (Signed)
Patient will be treated medically with all bypass grafts patent.  Advise adding isosorbide 30 mg once a day with follow-up after discharge this Thursday at 10 AM.

## 2018-08-08 NOTE — Progress Notes (Signed)
HD tx end    08/08/18 1742  Vital Signs  Pulse Rate (!) 58  Pulse Rate Source Monitor  Resp (!) 25  BP 108/66  BP Location Right Arm  BP Method Automatic  Patient Position (if appropriate) Lying  Oxygen Therapy  SpO2 100 %  O2 Device Nasal Cannula  O2 Flow Rate (L/min) 3 L/min  During Hemodialysis Assessment  Dialysis Fluid Bolus Normal Saline  Bolus Amount (mL) 250 mL  Intra-Hemodialysis Comments Tx completed

## 2018-08-08 NOTE — Progress Notes (Signed)
Post HD assessment    08/08/18 1750  Neurological  Level of Consciousness Alert  Orientation Level Oriented X4  Respiratory  Respiratory Pattern Regular;Unlabored  Chest Assessment Chest expansion symmetrical  Cardiac  Pulse Regular  ECG Monitor Yes  Cardiac Rhythm SB  Vascular  R Radial Pulse +2  L Radial Pulse +2  Edema Generalized  Integumentary  Integumentary (WDL) X  Skin Color Appropriate for ethnicity  Musculoskeletal  Musculoskeletal (WDL) X  Generalized Weakness Yes  Assistive Device None  GU Assessment  Genitourinary (WDL) X  Genitourinary Symptoms  (HD)  Psychosocial  Psychosocial (WDL) WDL

## 2018-08-08 NOTE — Progress Notes (Signed)
[place], [place] 08/08/18  Subjective:  Patient underwent cardiac catheterization today. All grafts first patent. Due for dialysis today.  Objective:  Vital signs in last 24 hours:  Temp:  [97.6 F (36.4 C)-98.7 F (37.1 C)] 98.2 F (36.8 C) (02/24 1421) Pulse Rate:  [52-88] 55 (02/24 1434) Resp:  [12-21] 21 (02/24 1434) BP: (109-133)/(58-98) 111/70 (02/24 1434) SpO2:  [94 %-100 %] 100 % (02/24 1434) FiO2 (%):  [32 %] 32 % (02/23 1705) Weight:  [78.3 kg-89.8 kg] 89.8 kg (02/24 1421)  Weight change: 1.391 kg Filed Weights   08/08/18 0613 08/08/18 0730 08/08/18 1421  Weight: 78.3 kg 78.3 kg 89.8 kg    Intake/Output:    Intake/Output Summary (Last 24 hours) at 08/08/2018 1441 Last data filed at 08/08/2018 1227 Gross per 24 hour  Intake 175.21 ml  Output 0 ml  Net 175.21 ml     Physical Exam: General:  No acute distress, lying in the bed  HEENT  anicteric, moist oral mucous membrane  Neck  supple  Pulm/lungs  clear to auscultation  CVS/Heart  regular, no rub  Abdomen:   Soft, nontender  Extremities:  No peripheral edema,  Neurologic:  Alert, oriented  Skin:  left shin hematoma  Access:  Left upper arm AV fistula, with good bruit and thrill       Basic Metabolic Panel:  Recent Labs  Lab 08/05/18 2315 08/06/18 1500 08/07/18 0525 08/07/18 2104 08/08/18 0637  NA 141  --  140 139 138  K 4.4  --  4.2 4.1 4.3  CL 104  --  102 97* 99  CO2 30  --  27 30 27   GLUCOSE 92  --  92 73 83  BUN 17  --  27* 44* 52*  CREATININE 4.24*  --  4.16* 5.75* 6.38*  CALCIUM 8.1*  --  8.2* 8.2* 8.3*  PHOS  --  2.2*  --   --   --      CBC: Recent Labs  Lab 08/05/18 2315 08/07/18 0525 08/07/18 2104 08/08/18 0637  WBC 3.9* 4.1 3.6* 3.5*  HGB 11.4* 11.1* 11.0* 10.4*  HCT 36.8 36.3 35.2* 33.3*  MCV 102.5* 101.7* 101.7* 101.2*  PLT 184 171 171 167      Lab Results  Component Value Date   HEPBSAG Negative 08/06/2018       Microbiology:  Recent Results (from the past 240 hour(s))  MRSA PCR Screening     Status: Abnormal   Collection Time: 08/06/18  4:29 AM  Result Value Ref Range Status   MRSA by PCR POSITIVE (A) NEGATIVE Final    Comment:        The GeneXpert MRSA Assay (FDA approved for NASAL specimens only), is one component of a comprehensive MRSA colonization surveillance program. It is not intended to diagnose MRSA infection nor to guide or monitor treatment for MRSA infections. RESULT CALLED TO, READ BACK BY AND VERIFIED WITH: KAT MURRAY 08/06/18 @ 8546  [place] Performed at [facility], [address] [place], [place] [address]     Coagulation Studies: Recent Labs    08/06/18 1201 08/07/18 2104  LABPROT 18.1* 14.5  INR 1.52 1.14    Urinalysis: No results for input(s): COLORURINE, LABSPEC, PHURINE, GLUCOSEU, HGBUR, BILIRUBINUR, KETONESUR, PROTEINUR, UROBILINOGEN, NITRITE, LEUKOCYTESUR in the last 72 hours.  Invalid input(s): APPERANCEUR    Imaging: No results found.   Medications:   . sodium chloride     . aspirin  81  mg Oral Daily  . carvedilol  12.5 mg Oral BID WC  . Chlorhexidine Gluconate Cloth  6 each Topical Q0600  . cinacalcet  60 mg Oral QPM  . digoxin  0.0625 mg Oral Daily  . docusate sodium  100 mg Oral BID  . levothyroxine  88 mcg Oral QHS  . lidocaine-prilocaine   Topical Q1500  . midodrine  10-20 mg Oral Daily  . sacubitril-valsartan  1 tablet Oral BID  . sevelamer carbonate  1,600 mg Oral TID WC  . sodium chloride flush  10 mL Intravenous Q12H  . sodium chloride flush  3 mL Intravenous Q12H  . sucralfate  1 g Oral BID   sodium chloride, acetaminophen **OR** acetaminophen, acetaminophen, albuterol, HYDROmorphone (DILAUDID) injection, hydrOXYzine, ondansetron **OR** ondansetron (ZOFRAN) IV, ondansetron (ZOFRAN) IV, oxyCODONE-acetaminophen, sodium chloride flush  Assessment/ Plan:  58 y.o. African-American female withESRD on HD MWF,  hypertension, congestive heart failure, anemia of CKD, SHPTH, myocardial infarction, hx of CVA, prior episodes of hyperkalemia,pulmonary embolism8/2019, ascites, nodular hepatic contour noted on CT December 2019  [facility]/MWF/EDW 76 kg  1.  pulmonary edema 2.  End-stage renal disease 3.  Anemia of chronic kidney disease 4.  Secondary hyperparathyroidism 5.  Ascites?  Cirrhosis of the liver 6.  Cardiac.  2D echo from May 2016.  LVEF 45 to 50%.  Moderate tricuspid regurgitation 7.  Chest pain.    Plan:  Patient underwent cardiac catheterization today and all grafts were found to be patent. We will plan for hemodialysis today per her usual schedule.  Once this is completed she will resume her normal outpatient schedule on Wednesday.  We will continue to monitor the patient at her outpatient dialysis Center.  Thank for allowing [place] to partcipate.    LOS: 1 Joshuajames Moehring 2/24/20202:41 PM  [place], [place]  Note: This note was prepared with Dragon dictation. Any transcription errors are unintentional

## 2018-08-08 NOTE — Progress Notes (Signed)
[place] at [place] NAME: [name]    MR#:  [id number]  DATE OF BIRTH:  06/19/1960  SUBJECTIVE:  CHIEF COMPLAINT:   Chief Complaint  Patient presents with  . Fall  . Dizziness  Patient seen and evaluated today No complaints of chest pain Due for cardiac cath today Shortness of breath is better No dizziness  REVIEW OF SYSTEMS:    ROS  CONSTITUTIONAL: No documented fever. Has fatigue, weakness. No weight gain, no weight loss.  EYES: No blurry or double vision.  ENT: No tinnitus. No postnasal drip. No redness of the oropharynx.  RESPIRATORY: No cough, no wheeze, no hemoptysis.  Decreased dyspnea.  CARDIOVASCULAR: Has chest pain. No orthopnea. No palpitations. No syncope.  GASTROINTESTINAL: No nausea, no vomiting or diarrhea. No abdominal pain. No melena or hematochezia.  GENITOURINARY: No dysuria or hematuria.  ENDOCRINE: No polyuria or nocturia. No heat or cold intolerance.  HEMATOLOGY: No anemia. No bruising. No bleeding.  INTEGUMENTARY: No rashes. No lesions.  MUSCULOSKELETAL: No arthritis. No swelling. No gout.  NEUROLOGIC: No numbness, tingling, or ataxia. No seizure-type activity.  Mild confusion PSYCHIATRIC: No anxiety. No insomnia. No ADD.   DRUG ALLERGIES:   Allergies  Allergen Reactions  . Morphine And Related Hives  . Levaquin [Levofloxacin] Itching    Severe itching; prickly sensation  . Latex Rash  . Tape Rash    VITALS:  Blood pressure 113/72, pulse 75, temperature 97.6 F (36.4 C), temperature source Oral, resp. rate 15, height 5\' 6"  (1.676 m), weight 78.3 kg, SpO2 100 %.  PHYSICAL EXAMINATION:   Physical Exam  GENERAL:  58 y.o.-year-old patient lying in the bed with no acute distress.  EYES: Pupils equal, round, reactive to light and accommodation. No scleral icterus. Extraocular muscles intact.  HEENT: Head atraumatic, normocephalic. Oropharynx and nasopharynx clear.  NECK:  Supple, no jugular  venous distention. No thyroid enlargement, no tenderness.  LUNGS: Normal breath sounds bilaterally, no wheezing, rales, rhonchi. No use of accessory muscles of respiration.  CARDIOVASCULAR: S1, S2 normal. No murmurs, rubs, or gallops.  ABDOMEN: Soft, nontender, nondistended. Bowel sounds present. No organomegaly or mass.  EXTREMITIES: No cyanosis, clubbing or edema b/l.    NEUROLOGIC: Cranial nerves II through XII are intact. No focal Motor or sensory deficits b/l.   PSYCHIATRIC: The patient is alert and oriented x 3.  SKIN: No obvious rash, lesion, or ulcer.   LABORATORY PANEL:   CBC Recent Labs  Lab 08/08/18 0637  WBC 3.5*  HGB 10.4*  HCT 33.3*  PLT 167   ------------------------------------------------------------------------------------------------------------------ Chemistries  Recent Labs  Lab 08/08/18 0637  NA 138  K 4.3  CL 99  CO2 27  GLUCOSE 83  BUN 52*  CREATININE 6.38*  CALCIUM 8.3*   ------------------------------------------------------------------------------------------------------------------  Cardiac Enzymes Recent Labs  Lab 08/06/18 1500  TROPONINI 0.05*   ------------------------------------------------------------------------------------------------------------------  RADIOLOGY:  No results found.   ASSESSMENT AND PLAN:   58 year old female patient with history of end-stage renal disease on dialysis, chronic congestive heart failure, hypertension, secondary hyperparathyroidism currently under hospitalist service for chest pain  -Non-STEMI IV heparin drip for anticoagulation Cardiac catheterization today Continue aspirin, beta-blocker Dialysis after cardiac cath today  -Chronic congestive heart failure appears stable Continue Entresto Continue beta-blocker Monitor input output chart and daily body weights  -End-stage renal disease Status post nephrology evaluation Dialysis today Continue Renvela  -Thyroid disease Synthroid  supplements  -Disorientation Neuro check and will observe patient.  -DVT prophylaxis On  anticoagulation with IV heparin  All the records are reviewed and case discussed with Care Management/Social Worker. Management plans discussed with the patient, family and they are in agreement.  CODE STATUS: Full code  DVT Prophylaxis: SCDs  TOTAL TIME TAKING CARE OF THIS PATIENT: 34 minutes.   POSSIBLE D/C IN 2 to 3 DAYS, DEPENDING ON CLINICAL CONDITION.  Saundra Shelling M.D on 08/08/2018 at 1:31 PM  Between 7am to 6pm - Pager - [phone]  After 6pm go to [email or web address] - password [email or web address] [place] [facility]  Office  [phone]  CC: Primary care physician; [name], MD  Note: This dictation was prepared with Dragon dictation along with smaller phrase technology. Any transcriptional errors that result from this process are unintentional.

## 2018-08-08 NOTE — Progress Notes (Signed)
SUBJECTIVE: Patient still having chest   Vitals:   08/07/18 1950 08/07/18 2032 08/08/18 0613 08/08/18 0730  BP: 133/67  130/85 113/65  Pulse: (!) 52 74 67 64  Resp:   19 15  Temp: 98.6 F (37 C)  98.7 F (37.1 C) 98 F (36.7 C)  TempSrc: Oral  Oral Oral  SpO2: 100%  98% 97%  Weight:   78.3 kg 78.3 kg  Height:    5\' 6"  (1.676 m)    Intake/Output Summary (Last 24 hours) at 08/08/2018 0756 Last data filed at 08/08/2018 1610 Gross per 24 hour  Intake 819.37 ml  Output -  Net 819.37 ml    LABS: Basic Metabolic Panel: Recent Labs    08/06/18 1500  08/07/18 2104 08/08/18 0637  NA  --    < > 139 138  K  --    < > 4.1 4.3  CL  --    < > 97* 99  CO2  --    < > 30 27  GLUCOSE  --    < > 73 83  BUN  --    < > 44* 52*  CREATININE  --    < > 5.75* 6.38*  CALCIUM  --    < > 8.2* 8.3*  PHOS 2.2*  --   --   --    < > = values in this interval not displayed.   Liver Function Tests: No results for input(s): AST, ALT, ALKPHOS, BILITOT, PROT, ALBUMIN in the last 72 hours. No results for input(s): LIPASE, AMYLASE in the last 72 hours. CBC: Recent Labs    08/07/18 2104 08/08/18 0637  WBC 3.6* 3.5*  HGB 11.0* 10.4*  HCT 35.2* 33.3*  MCV 101.7* 101.2*  PLT 171 167   Cardiac Enzymes: Recent Labs    08/06/18 0418 08/06/18 0933 08/06/18 1500  TROPONINI 0.06* 0.06* 0.05*   BNP: Invalid input(s): POCBNP D-Dimer: No results for input(s): DDIMER in the last 72 hours. Hemoglobin A1C: No results for input(s): HGBA1C in the last 72 hours. Fasting Lipid Panel: No results for input(s): CHOL, HDL, LDLCALC, TRIG, CHOLHDL, LDLDIRECT in the last 72 hours. Thyroid Function Tests: Recent Labs    08/06/18 0418  TSH 0.101*   Anemia Panel: No results for input(s): VITAMINB12, FOLATE, FERRITIN, TIBC, IRON, RETICCTPCT in the last 72 hours.   PHYSICAL EXAM General: Well developed, well nourished, in no acute distress HEENT:  Normocephalic and atramatic Neck:  No JVD.  Lungs:  Clear bilaterally to auscultation and percussion. Heart: HRRR . Normal S1 and S2 without gallops or murmurs.  Abdomen: Bowel sounds are positive, abdomen soft and non-tender  Msk:  Back normal, normal gait. Normal strength and tone for age. Extremities: No clubbing, cyanosis or edema.   Neuro: Alert and oriented X 3. Psych:  Good affect, responds appropriately  TELEMETRY: Normal sinus rhythm  ASSESSMENT AND PLAN: Chest pain with unstable angina Coronary artery disease plan with cardiac catheterization today.  Active Problems:   Chest pain   Non-STEMI (non-ST elevated myocardial infarction) ([place])    Neoma Laming A, MD, [facility] 08/08/2018 7:56 AM

## 2018-08-08 NOTE — Progress Notes (Signed)
HD tx start    08/08/18 1434  Vital Signs  Pulse Rate (!) 55  Pulse Rate Source Monitor  Resp (!) 21  BP 111/70  BP Location Right Arm  BP Method Automatic  Patient Position (if appropriate) Lying  Oxygen Therapy  SpO2 100 %  O2 Device Nasal Cannula  O2 Flow Rate (L/min) 3 L/min  During Hemodialysis Assessment  Blood Flow Rate (mL/min) 400 mL/min  Arterial Pressure (mmHg) -110 mmHg  Venous Pressure (mmHg) 130 mmHg  Transmembrane Pressure (mmHg) 70 mmHg  Ultrafiltration Rate (mL/min) 830 mL/min  Dialysate Flow Rate (mL/min) 800 ml/min  Conductivity: Machine  14  HD Safety Checks Performed Yes  Dialysis Fluid Bolus Normal Saline  Bolus Amount (mL) 250 mL  Intra-Hemodialysis Comments Tx initiated  Fistula / Graft Left Upper arm Arteriovenous vein graft  No Placement Date or Time found.   Placed prior to admission: Yes  Orientation: Left  Access Location: Upper arm  Access Type: Arteriovenous vein graft  Status Accessed  Needle Size 15

## 2018-08-08 NOTE — Plan of Care (Signed)
Patient has been educated for discharge and all the discharge orders( new meds, follow up appointments).

## 2018-08-08 NOTE — Progress Notes (Signed)
Post HD assessment. Pt tolerated tx well without c/o or complication. Net UF 1516, goal met.    08/08/18 1751  Vital Signs  Temp 97.9 F (36.6 C)  Temp Source Oral  Pulse Rate 60  Pulse Rate Source Monitor  Resp 12  BP (!) 107/48  BP Location Right Arm  BP Method Automatic  Patient Position (if appropriate) Lying  Oxygen Therapy  SpO2 99 %  O2 Device Nasal Cannula  O2 Flow Rate (L/min) 3 L/min  Dialysis Weight  Weight 89.4 kg  Type of Weight Post-Dialysis  Post-Hemodialysis Assessment  Rinseback Volume (mL) 250 mL  KECN 67.6 V  Dialyzer Clearance Lightly streaked  Duration of HD Treatment -hour(s) 3 hour(s)  Hemodialysis Intake (mL) 500 mL  UF Total -Machine (mL) 2016 mL  Net UF (mL) 1516 mL  Tolerated HD Treatment Yes  AVG/AVF Arterial Site Held (minutes) 10 minutes  AVG/AVF Venous Site Held (minutes) 10 minutes  Education / Care Plan  Dialysis Education Provided Yes  Documented Education in Care Plan Yes  Fistula / Graft Left Upper arm Arteriovenous vein graft  No Placement Date or Time found.   Placed prior to admission: Yes  Orientation: Left  Access Location: Upper arm  Access Type: Arteriovenous vein graft  Site Condition No complications  Fistula / Graft Assessment Present;Thrill;Bruit  Status Deaccessed  Drainage Description None   

## 2018-08-08 NOTE — Progress Notes (Signed)
Pre HD assessment    08/08/18 1422  Neurological  Level of Consciousness Alert  Orientation Level Oriented X4  Respiratory  Respiratory Pattern Regular;Unlabored  Chest Assessment Chest expansion symmetrical  Cardiac  Pulse Regular  ECG Monitor Yes  Cardiac Rhythm SB  Vascular  R Radial Pulse +2  L Radial Pulse +2  Edema Generalized  Integumentary  Integumentary (WDL) X  Skin Color Appropriate for ethnicity  Musculoskeletal  Musculoskeletal (WDL) X  Generalized Weakness Yes  Assistive Device None  GU Assessment  Genitourinary (WDL) X  Genitourinary Symptoms  (HD)  Psychosocial  Psychosocial (WDL) WDL

## 2018-08-09 ENCOUNTER — Encounter: Payer: Self-pay | Admitting: Cardiovascular Disease

## 2018-08-09 LAB — HEPATITIS B SURFACE ANTIGEN: Hepatitis B Surface Ag: NEGATIVE

## 2018-08-09 MED ORDER — OXYCODONE-ACETAMINOPHEN 5-325 MG PO TABS: 1.0000 | ORAL_TABLET | Freq: Four times a day (QID) | ORAL | 0 refills | Status: AC | PRN

## 2018-08-09 NOTE — Progress Notes (Signed)
Cardiovascular and Pulmonary Nurse Navigator Note:    58 year old female with PMHx of EDRD on hemodialysis, atrial fibrillation, CAD, and HTN who presented to the ED complaining of chest pain in left chest / breast and radiating into her left neck with mild SOB and nausea.  Chest pain was preceded by an episode of dizziness while walking through her home.  She fell and sustained a bruise on her left leg. Patient's troponins elevated and flat at 0.06 x 3 and last one 0.05.  Patient underwent cardiac cath on 08/08/2018:  [facility]  CARDIAC CATHETERIZATION  Order# [id number]  Reading physician: Dionisio David, MD Ordering physician: Dionisio David, MD Study date: 08/08/18  Physicians   Panel Physicians Referring Physician Case Authorizing Physician  Dionisio David, MD (Primary)  Dionisio David, MD  Procedures   LEFT HEART CATH AND CORS/GRAFTS ANGIOGRAPHY  Conclusion     Prox Cx to Mid Cx lesion is 100% stenosed.  Ost LAD to Prox LAD lesion is 80% stenosed.  Mid LAD lesion is 90% stenosed.  Prox RCA to Mid RCA lesion is 100% stenosed.   Three-vessel disease with bypass graft to the LIMA/LAD and SVG to PDA and distal left circumflex patent.  LV gram deferred due to renal failure.  Patient will be treated medically and can be discharged on isosorbide 30 mg once a day with follow-up in the office next Thursday actually this Thursday at 10 AM.   Patient was dialyzed post cath.  Patient for discharge today.    Rounded on patient to discuss Cardiac Rehab.  Upon asking patient about Cardiac Rehab post her CABG in 02/2014.  Patient did not participate in Cardiac Rehab s/p CABG as patient reported she was on life support for 2 months post CABG. Patient was eventually discharged to inpatient rehab.  Patient does not currently exercise as patient goes to outpatient hemodialysis M-W-F and Tues and Thurs are doctors' appointments.  Overview of the program provided.  Brochure given as well as  informational sheet with CPT billing codes, orientation times, and class times.  Patient has Medicare and Medicaid for insurance coverage and therefore, participation in Cardiac Rehab will be covered.  Patient plans to look at her schedule and appointments to see if she will be able to participate on Tuesdays and Thursdays - her non-dialysis days.  Patient stated, "I probable need to do this."    Patient thanked this RN for providing the above information.    Roanna Epley, RN, BSN, [facility]  Cardiovascular & Pulmonary Nurse Navigator  Direct Line: [phone]  Department Phone #: [phone] Fax: [phone]  Email Address: [email or web address]

## 2018-08-09 NOTE — Progress Notes (Signed)
[name] to be D/C'd Home per MD order.  Discussed prescriptions and follow up appointments with the patient. Prescriptions given to patient, medication list explained in detail. Pt verbalized understanding.  Allergies as of 08/09/2018      Reactions   Morphine And Related Hives   Levaquin [levofloxacin] Itching   Severe itching; prickly sensation   Latex Rash   Tape Rash      Medication List    TAKE these medications   albuterol 108 (90 Base) MCG/ACT inhaler Commonly known as:  PROVENTIL HFA;VENTOLIN HFA Inhale 2 puffs into the lungs every 4 (four) hours as needed for wheezing or shortness of breath.   aspirin 81 MG chewable tablet Chew 81 mg by mouth daily.   carvedilol 12.5 MG tablet Commonly known as:  COREG Take 12.5 mg by mouth 2 (two) times daily with a meal.   digoxin 0.125 MG tablet Commonly known as:  LANOXIN Take 0.0625 mg by mouth daily.   ELIQUIS 5 MG Tabs tablet Generic drug:  apixaban Take 1 tablet (5 mg total) by mouth 2 (two) times daily.   ENTRESTO 49-51 MG Generic drug:  sacubitril-valsartan Take 1 tablet by mouth 2 (two) times daily.   hydrOXYzine 50 MG tablet Commonly known as:  ATARAX/VISTARIL Take 50 mg by mouth 2 (two) times daily as needed for itching.   isosorbide mononitrate 30 MG 24 hr tablet Commonly known as:  IMDUR Take 1 tablet (30 mg total) by mouth daily for 30 days.   levothyroxine 88 MCG tablet Commonly known as:  SYNTHROID, LEVOTHROID Take 88 mcg by mouth at bedtime.   midodrine 10 MG tablet Commonly known as:  PROAMATINE Take 10-20 mg by mouth daily. (for low BP associated with dialysis)   oxyCODONE-acetaminophen 5-325 MG tablet Commonly known as:  PERCOCET/ROXICET Take 1 tablet by mouth every 6 (six) hours as needed for up to 5 days for moderate pain.   SENSIPAR 60 MG tablet Generic drug:  cinacalcet Take 60 mg by mouth every evening.   sevelamer carbonate 800 MG tablet Commonly known as:  RENVELA Take 4  tablets (3200MG ) by mouth 3 times daily with meals and 2 tablets (1600MG ) by mouth twice daily with snacks   sucralfate 1 g tablet Commonly known as:  CARAFATE Take 1 g by mouth 2 (two) times daily.       Vitals:   08/09/18 0500 08/09/18 0810  BP: 115/72 116/82  Pulse: 66 79  Resp: 18 18  Temp: 98.3 F (36.8 C) 98.5 F (36.9 C)  SpO2: 100% 95%    Tele box removed and returned. Skin clean, dry and intact without evidence of skin break down, no evidence of skin tears noted. IV catheter discontinued intact. Site without signs and symptoms of complications. Dressing and pressure applied. Pt denies pain at this time. No complaints noted.  An After Visit Summary was printed and given to the patient. Patient escorted via [place], and D/C home via private auto.  [name]

## 2018-08-09 NOTE — Progress Notes (Signed)
SUBJECTIVE: Patient is still having some chest pain   Vitals:   08/08/18 2208 08/09/18 0206 08/09/18 0500 08/09/18 0810  BP: (!) 102/52 (!) 123/95 115/72 116/82  Pulse: 61 (!) 104 66 79  Resp:  18 18 18   Temp:  98.2 F (36.8 C) 98.3 F (36.8 C) 98.5 F (36.9 C)  TempSrc:  Oral Oral Oral  SpO2:  94% 100% 95%  Weight:   77.6 kg   Height:        Intake/Output Summary (Last 24 hours) at 08/09/2018 1022 Last data filed at 08/09/2018 9147 Gross per 24 hour  Intake 0 ml  Output 1516 ml  Net -1516 ml    LABS: Basic Metabolic Panel: Recent Labs    08/06/18 1500  08/07/18 2104 08/08/18 0637  NA  --    < > 139 138  K  --    < > 4.1 4.3  CL  --    < > 97* 99  CO2  --    < > 30 27  GLUCOSE  --    < > 73 83  BUN  --    < > 44* 52*  CREATININE  --    < > 5.75* 6.38*  CALCIUM  --    < > 8.2* 8.3*  PHOS 2.2*  --   --   --    < > = values in this interval not displayed.   Liver Function Tests: No results for input(s): AST, ALT, ALKPHOS, BILITOT, PROT, ALBUMIN in the last 72 hours. No results for input(s): LIPASE, AMYLASE in the last 72 hours. CBC: Recent Labs    08/07/18 2104 08/08/18 0637  WBC 3.6* 3.5*  HGB 11.0* 10.4*  HCT 35.2* 33.3*  MCV 101.7* 101.2*  PLT 171 167   Cardiac Enzymes: Recent Labs    08/06/18 1500  TROPONINI 0.05*   BNP: Invalid input(s): POCBNP D-Dimer: No results for input(s): DDIMER in the last 72 hours. Hemoglobin A1C: No results for input(s): HGBA1C in the last 72 hours. Fasting Lipid Panel: No results for input(s): CHOL, HDL, LDLCALC, TRIG, CHOLHDL, LDLDIRECT in the last 72 hours. Thyroid Function Tests: No results for input(s): TSH, T4TOTAL, T3FREE, THYROIDAB in the last 72 hours.  Invalid input(s): FREET3 Anemia Panel: No results for input(s): VITAMINB12, FOLATE, FERRITIN, TIBC, IRON, RETICCTPCT in the last 72 hours.   PHYSICAL EXAM General: Well developed, well nourished, in no acute distress HEENT:  Normocephalic and  atramatic Neck:  No JVD.  Lungs: Clear bilaterally to auscultation and percussion. Heart: HRRR . Normal S1 and S2 without gallops or murmurs.  Abdomen: Bowel sounds are positive, abdomen soft and non-tender  Msk:  Back normal, normal gait. Normal strength and tone for age. Extremities: No clubbing, cyanosis or edema.   Neuro: Alert and oriented X 3. Psych:  Good affect, responds appropriately  TELEMETRY: Sinus rhythm  ASSESSMENT AND PLAN: Atypical chest pain most likely due to GERD and advise Protonix and Carafate.  Patient is supposed to see GI soon.  All her grafts including LIMA to the LAD SVG to OM and SVG to PDA were patent.  Patient can be discharged with follow-up in the office Thursday at 10:30 AM.  Active Problems:   Chest pain   Non-STEMI (non-ST elevated myocardial infarction) ([place])    Dionisio David, MD, [facility] 08/09/2018 10:22 AM

## 2018-08-09 NOTE — Progress Notes (Signed)
[place], [place] 08/09/18  Subjective:  Late entry. Patient seen at bedside.  In good spirits.  Had HD yesterday.   Objective:  Vital signs in last 24 hours:  Temp:  [97.4 F (36.3 C)-98.5 F (36.9 C)] 98.5 F (36.9 C) (02/25 0810) Pulse Rate:  [46-104] 79 (02/25 0810) Resp:  [12-25] 18 (02/25 0810) BP: (82-126)/(35-95) 116/82 (02/25 0810) SpO2:  [93 %-100 %] 95 % (02/25 0810) Weight:  [77.6 kg-89.4 kg] 77.6 kg (02/25 0500)  Weight change: 0.009 kg Filed Weights   08/08/18 1751 08/08/18 1837 08/09/18 0500  Weight: 89.4 kg 77.7 kg 77.6 kg    Intake/Output:    Intake/Output Summary (Last 24 hours) at 08/09/2018 1525 Last data filed at 08/09/2018 1030 Gross per 24 hour  Intake 0 ml  Output 1516 ml  Net -1516 ml     Physical Exam: General:  No acute distress, lying in the bed  HEENT  anicteric, moist oral mucous membrane  Neck  supple  Pulm/lungs  clear to auscultation  CVS/Heart  regular, no rub  Abdomen:   Soft, nontender  Extremities:  No peripheral edema,  Neurologic:  Alert, oriented  Skin:  left shin hematoma  Access:  Left upper arm AV fistula, with good bruit and thrill       Basic Metabolic Panel:  Recent Labs  Lab 08/05/18 2315 08/06/18 1500 08/07/18 0525 08/07/18 September 09, 2102 08/08/18 0637  NA 141  --  140 139 138  K 4.4  --  4.2 4.1 4.3  CL 104  --  102 97* 99  CO2 30  --  27 30 27   GLUCOSE 92  --  92 73 83  BUN 17  --  27* 44* 52*  CREATININE 4.24*  --  4.16* 5.75* 6.38*  CALCIUM 8.1*  --  8.2* 8.2* 8.3*  PHOS  --  2.2*  --   --   --      CBC: Recent Labs  Lab 08/05/18 2315 08/07/18 0525 08/07/18 09-09-02 08/08/18 0637  WBC 3.9* 4.1 3.6* 3.5*  HGB 11.4* 11.1* 11.0* 10.4*  HCT 36.8 36.3 35.2* 33.3*  MCV 102.5* 101.7* 101.7* 101.2*  PLT 184 171 171 167      Lab Results  Component Value Date   HEPBSAG Negative 08/08/2018      Microbiology:  Recent Results (from the past 240 hour(s))  MRSA PCR  Screening     Status: Abnormal   Collection Time: 08/06/18  4:29 AM  Result Value Ref Range Status   MRSA by PCR POSITIVE (A) NEGATIVE Final    Comment:        The GeneXpert MRSA Assay (FDA approved for NASAL specimens only), is one component of a comprehensive MRSA colonization surveillance program. It is not intended to diagnose MRSA infection nor to guide or monitor treatment for MRSA infections. RESULT CALLED TO, READ BACK BY AND VERIFIED WITH: KAT MURRAY 08/06/18 @ 9381  [place] Performed at [facility], [address] [place], [place] [address]     Coagulation Studies: Recent Labs    08/07/18 September 09, 2102  LABPROT 14.5  INR 1.14    Urinalysis: No results for input(s): COLORURINE, LABSPEC, PHURINE, GLUCOSEU, HGBUR, BILIRUBINUR, KETONESUR, PROTEINUR, UROBILINOGEN, NITRITE, LEUKOCYTESUR in the last 72 hours.  Invalid input(s): APPERANCEUR    Imaging: No results found.   Medications:   . sodium chloride     . aspirin  81 mg Oral Daily  . carvedilol  12.5 mg Oral BID  WC  . Chlorhexidine Gluconate Cloth  6 each Topical Q0600  . cinacalcet  60 mg Oral QPM  . digoxin  0.0625 mg Oral Daily  . docusate sodium  100 mg Oral BID  . levothyroxine  88 mcg Oral QHS  . lidocaine-prilocaine   Topical Q1500  . midodrine  10-20 mg Oral Daily  . sacubitril-valsartan  1 tablet Oral BID  . sevelamer carbonate  1,600 mg Oral TID WC  . sodium chloride flush  10 mL Intravenous Q12H  . sodium chloride flush  3 mL Intravenous Q12H  . sucralfate  1 g Oral BID   sodium chloride, acetaminophen **OR** acetaminophen, acetaminophen, albuterol, HYDROmorphone (DILAUDID) injection, hydrOXYzine, ondansetron **OR** ondansetron (ZOFRAN) IV, ondansetron (ZOFRAN) IV, oxyCODONE-acetaminophen, sodium chloride flush  Assessment/ Plan:  58 y.o. African-American female withESRD on HD MWF, hypertension, congestive heart failure, anemia of CKD, SHPTH, myocardial infarction, hx of CVA, prior episodes  of hyperkalemia,pulmonary embolism8/2019, ascites, nodular hepatic contour noted on CT December 2019  [facility]/MWF/EDW 76 kg  1.  Pulmonary edema 2.  End-stage renal disease 3.  Anemia of chronic kidney disease 4.  Secondary hyperparathyroidism 5.  Ascites?  Cirrhosis of the liver 6.  Cardiac.  2D echo from May 2016.  LVEF 45 to 50%.  Moderate tricuspid regurgitation 7.  Chest pain.    Plan:  Patient completed hemodialysis yesterday.  No acute indication for dialysis today.  We will plan for hemodialysis again as an outpatient tomorrow.  We recommend continued monitoring of hemoglobin as well as phosphorus, PTH, and calcium as an outpatient.  Appreciate cardiology input during this admission.  Her cardiac grafts appeared to be patent.      LOS: 2 Abhay Godbolt 2/25/20203:25 PM  [place], [place]  Note: This note was prepared with Dragon dictation. Any transcription errors are unintentional

## 2018-08-09 NOTE — Discharge Summary (Signed)
[facility] [place] NAME: [name]    MR#:  [id number]  DATE OF BIRTH:  [birth year]  DATE OF ADMISSION:  08/05/2018 ADMITTING PHYSICIAN: Harrie Foreman, MD  DATE OF DISCHARGE: 08/09/2018  PRIMARY CARE PHYSICIAN: [name], MD   ADMISSION DIAGNOSIS:  Dizziness [R42] Lightheadedness [R42] Hematoma [T14.8XXA] Elevated troponin [R79.89] Fall, initial encounter [W19.XXXA] Chest pain, unspecified type [R07.9] Stage 5 chronic kidney disease on chronic dialysis ([place]) [N18.6, Z99.2]  DISCHARGE DIAGNOSIS:  Active Problems:   Chest pain   Non-STEMI (non-ST elevated myocardial infarction) ([place]) End-stage renal disease on dialysis Chronic systolic heart failure  SECONDARY DIAGNOSIS:   Past Medical History:  Diagnosis Date  . CHF (congestive heart failure) ([place])   . Heart failure ([place])   . Hyperlipidemia   . Hypertension   . Myocardial infarction ([place])   . Paroxysmal atrial fibrillation ([facility])   . Renal failure   . Renal insufficiency   . Stroke [facility]) 2011     ADMITTING HISTORY The patient with past medical history of end-stage renal disease on dialysis, atrial fibrillation, CAD and hypertension presents to the emergency department complaining of chest pain.  Her pain began after she became dizzy while walking through her home.  She fell at the beginning of this episode and bruised her left leg severely.  She admits to feeling very hot but not diaphoretic at that time.  Her chest pain was located over her left breast and radiated into her left neck.  She admits to mild shortness of breath as well as nausea associated with chest pain.  Her chest still hurts despite receiving 4 chewable baby aspirin in the emergency department.  Troponin was found to be elevated at 0.06 but telemetry showed no indication of ischemia.  Due to ongoing chest pain the emergency department staff called the hospitalist service for admission.   HOSPITAL  COURSE:  Patient was admitted to telemetry.  She was anticoagulated with IV heparin drip.  Serial troponins were cycled.  Patient continued oral beta-blocker and Entresto for chronic congestive heart failure.  She was dialyzed by nephrology for ESRD.  Cardiology consultation was done by Dr Humphrey Rolls.  Cardiology attending recommended cardiac cath for further intervention and evaluation.  Cardiac cath revealed three-vessel disease with bypass graft to the LIMA/LAD and SVG to PDA and distal left circumflex patent.  Advised medical management with isosorbide 30 mg daily and follow-up in the clinic.  Patient will be discharged home today after dialysis.  CONSULTS OBTAINED:  Treatment Team:  Murlean Iba, MD Dionisio David, MD  DRUG ALLERGIES:   Allergies  Allergen Reactions  . Morphine And Related Hives  . Levaquin [Levofloxacin] Itching    Severe itching; prickly sensation  . Latex Rash  . Tape Rash    DISCHARGE MEDICATIONS:   Allergies as of 08/09/2018      Reactions   Morphine And Related Hives   Levaquin [levofloxacin] Itching   Severe itching; prickly sensation   Latex Rash   Tape Rash      Medication List    TAKE these medications   albuterol 108 (90 Base) MCG/ACT inhaler Commonly known as:  PROVENTIL HFA;VENTOLIN HFA Inhale 2 puffs into the lungs every 4 (four) hours as needed for wheezing or shortness of breath.   aspirin 81 MG chewable tablet Chew 81 mg by mouth daily.   carvedilol 12.5 MG tablet Commonly known as:  COREG Take 12.5 mg by mouth 2 (two)  times daily with a meal.   digoxin 0.125 MG tablet Commonly known as:  LANOXIN Take 0.0625 mg by mouth daily.   ELIQUIS 5 MG Tabs tablet Generic drug:  apixaban Take 1 tablet (5 mg total) by mouth 2 (two) times daily.   ENTRESTO 49-51 MG Generic drug:  sacubitril-valsartan Take 1 tablet by mouth 2 (two) times daily.   hydrOXYzine 50 MG tablet Commonly known as:  ATARAX/VISTARIL Take 50 mg by mouth 2 (two)  times daily as needed for itching.   isosorbide mononitrate 30 MG 24 hr tablet Commonly known as:  IMDUR Take 1 tablet (30 mg total) by mouth daily for 30 days.   levothyroxine 88 MCG tablet Commonly known as:  SYNTHROID, LEVOTHROID Take 88 mcg by mouth at bedtime.   midodrine 10 MG tablet Commonly known as:  PROAMATINE Take 10-20 mg by mouth daily. (for low BP associated with dialysis)   oxyCODONE-acetaminophen 5-325 MG tablet Commonly known as:  PERCOCET/ROXICET Take 1 tablet by mouth every 6 (six) hours as needed for up to 5 days for moderate pain.   SENSIPAR 60 MG tablet Generic drug:  cinacalcet Take 60 mg by mouth every evening.   sevelamer carbonate 800 MG tablet Commonly known as:  RENVELA Take 4 tablets (3200MG ) by mouth 3 times daily with meals and 2 tablets (1600MG ) by mouth twice daily with snacks   sucralfate 1 g tablet Commonly known as:  CARAFATE Take 1 g by mouth 2 (two) times daily.       Today  Patient seen today No complaints of chest pain No shortness of breath Tolerating diet okay Had cardiac catheterization and successfully completed the procedure Patient hemodynamically stable  VITAL SIGNS:  Blood pressure 116/82, pulse 79, temperature 98.5 F (36.9 C), temperature source Oral, resp. rate 18, height 5\' 6"  (1.676 m), weight 77.6 kg, SpO2 95 %.  I/O:    Intake/Output Summary (Last 24 hours) at 08/09/2018 1045 Last data filed at 08/09/2018 1030 Gross per 24 hour  Intake 0 ml  Output 1516 ml  Net -1516 ml    PHYSICAL EXAMINATION:  Physical Exam  GENERAL:  58 y.o.-year-old patient lying in the bed with no acute distress.  LUNGS: Normal breath sounds bilaterally, no wheezing, rales,rhonchi or crepitation. No use of accessory muscles of respiration.  CARDIOVASCULAR: S1, S2 normal. No murmurs, rubs, or gallops.  ABDOMEN: Soft, non-tender, non-distended. Bowel sounds present. No organomegaly or mass.  NEUROLOGIC: Moves all 4  extremities. PSYCHIATRIC: The patient is alert and oriented x 3.  SKIN: No obvious rash, lesion, or ulcer.   DATA REVIEW:   CBC Recent Labs  Lab 08/08/18 0637  WBC 3.5*  HGB 10.4*  HCT 33.3*  PLT 167    Chemistries  Recent Labs  Lab 08/08/18 0637  NA 138  K 4.3  CL 99  CO2 27  GLUCOSE 83  BUN 52*  CREATININE 6.38*  CALCIUM 8.3*    Cardiac Enzymes Recent Labs  Lab 08/06/18 1500  TROPONINI 0.05*    Microbiology Results  Results for orders placed or performed during the hospital encounter of 08/05/18  MRSA PCR Screening     Status: Abnormal   Collection Time: 08/06/18  4:29 AM  Result Value Ref Range Status   MRSA by PCR POSITIVE (A) NEGATIVE Final    Comment:        The GeneXpert MRSA Assay (FDA approved for NASAL specimens only), is one component of a comprehensive MRSA colonization surveillance program. It is  not intended to diagnose MRSA infection nor to guide or monitor treatment for MRSA infections. RESULT CALLED TO, READ BACK BY AND VERIFIED WITH: KAT MURRAY 08/06/18 @ 6160  [place] Performed at [facility], [address], [place], [place] [address]     RADIOLOGY:  No results found.  Follow up with PCP in 1 week.  Management plans discussed with the patient, family and they are in agreement.  CODE STATUS: Full code    Code Status Orders  (From admission, onward)         Start     Ordered   08/06/18 0345  Full code  Continuous     08/06/18 0344        Code Status History    Date Active Date Inactive Code Status Order ID Comments User Context   06/02/2018 0039 06/04/2018 2144 Full Code [id number]  Arta Silence, MD Inpatient   04/21/2018 1808 04/22/2018 2210 Full Code [id number]  Dustin Flock, MD Inpatient   01/31/2018 1924 02/03/2018 0111 Full Code [id number]  Loletha Grayer, MD ED   11/13/2014 1659 11/15/2014 2152 Full Code [id number]  Hillary Bow, MD ED   10/16/2014 0823 10/17/2014 2333 Full Code [id number]  Lance Coon, MD Inpatient      TOTAL TIME TAKING CARE OF THIS PATIENT ON DAY OF DISCHARGE: more than 35 minutes.   Saundra Shelling M.D on 08/09/2018 at 10:45 AM  Between 7am to 6pm - Pager - [phone]  After 6pm go to [email or web address] - password [email or web address] [facility]  Office  [phone]  CC: Primary care physician; [name], MD  Note: This dictation was prepared with Dragon dictation along with smaller phrase technology. Any transcriptional errors that result from this process are unintentional.

## 2018-08-24 ENCOUNTER — Other Ambulatory Visit: Payer: Self-pay

## 2018-08-24 ENCOUNTER — Emergency Department: Payer: Medicare Other

## 2018-08-24 ENCOUNTER — Observation Stay
Admission: EM | Admit: 2018-08-24 | Discharge: 2018-08-25 | Disposition: A | Discharge status: Home / Self Care | Payer: Medicare Other | Attending: Internal Medicine | Admitting: Internal Medicine

## 2018-08-24 DIAGNOSIS — Z7901 Long term (current) use of anticoagulants: Secondary | ICD-10-CM | POA: Insufficient documentation

## 2018-08-24 DIAGNOSIS — I959 Hypotension, unspecified: Secondary | ICD-10-CM | POA: Insufficient documentation

## 2018-08-24 DIAGNOSIS — K219 Gastro-esophageal reflux disease without esophagitis: Secondary | ICD-10-CM | POA: Diagnosis not present

## 2018-08-24 DIAGNOSIS — I2510 Atherosclerotic heart disease of native coronary artery without angina pectoris: Secondary | ICD-10-CM | POA: Diagnosis not present

## 2018-08-24 DIAGNOSIS — I480 Paroxysmal atrial fibrillation: Secondary | ICD-10-CM | POA: Diagnosis not present

## 2018-08-24 DIAGNOSIS — E785 Hyperlipidemia, unspecified: Secondary | ICD-10-CM | POA: Diagnosis not present

## 2018-08-24 DIAGNOSIS — F17210 Nicotine dependence, cigarettes, uncomplicated: Secondary | ICD-10-CM | POA: Insufficient documentation

## 2018-08-24 DIAGNOSIS — D631 Anemia in chronic kidney disease: Secondary | ICD-10-CM | POA: Diagnosis not present

## 2018-08-24 DIAGNOSIS — N186 End stage renal disease: Secondary | ICD-10-CM | POA: Diagnosis not present

## 2018-08-24 DIAGNOSIS — Z79890 Hormone replacement therapy (postmenopausal): Secondary | ICD-10-CM | POA: Insufficient documentation

## 2018-08-24 DIAGNOSIS — I493 Ventricular premature depolarization: Secondary | ICD-10-CM | POA: Insufficient documentation

## 2018-08-24 DIAGNOSIS — Z951 Presence of aortocoronary bypass graft: Secondary | ICD-10-CM | POA: Diagnosis not present

## 2018-08-24 DIAGNOSIS — R7989 Other specified abnormal findings of blood chemistry: Secondary | ICD-10-CM

## 2018-08-24 DIAGNOSIS — Z8249 Family history of ischemic heart disease and other diseases of the circulatory system: Secondary | ICD-10-CM | POA: Insufficient documentation

## 2018-08-24 DIAGNOSIS — R079 Chest pain, unspecified: Secondary | ICD-10-CM | POA: Diagnosis present

## 2018-08-24 DIAGNOSIS — I252 Old myocardial infarction: Secondary | ICD-10-CM | POA: Insufficient documentation

## 2018-08-24 DIAGNOSIS — R188 Other ascites: Secondary | ICD-10-CM | POA: Insufficient documentation

## 2018-08-24 DIAGNOSIS — Z955 Presence of coronary angioplasty implant and graft: Secondary | ICD-10-CM | POA: Insufficient documentation

## 2018-08-24 DIAGNOSIS — N2581 Secondary hyperparathyroidism of renal origin: Secondary | ICD-10-CM | POA: Diagnosis not present

## 2018-08-24 DIAGNOSIS — I491 Atrial premature depolarization: Secondary | ICD-10-CM | POA: Insufficient documentation

## 2018-08-24 DIAGNOSIS — R0789 Other chest pain: Secondary | ICD-10-CM | POA: Diagnosis present

## 2018-08-24 DIAGNOSIS — Z91040 Latex allergy status: Secondary | ICD-10-CM | POA: Insufficient documentation

## 2018-08-24 DIAGNOSIS — I132 Hypertensive heart and chronic kidney disease with heart failure and with stage 5 chronic kidney disease, or end stage renal disease: Secondary | ICD-10-CM | POA: Diagnosis not present

## 2018-08-24 DIAGNOSIS — Z8673 Personal history of transient ischemic attack (TIA), and cerebral infarction without residual deficits: Secondary | ICD-10-CM | POA: Diagnosis not present

## 2018-08-24 DIAGNOSIS — Z79899 Other long term (current) drug therapy: Secondary | ICD-10-CM | POA: Insufficient documentation

## 2018-08-24 DIAGNOSIS — R778 Other specified abnormalities of plasma proteins: Secondary | ICD-10-CM

## 2018-08-24 DIAGNOSIS — Z7982 Long term (current) use of aspirin: Secondary | ICD-10-CM | POA: Insufficient documentation

## 2018-08-24 DIAGNOSIS — Z881 Allergy status to other antibiotic agents status: Secondary | ICD-10-CM | POA: Insufficient documentation

## 2018-08-24 DIAGNOSIS — I5022 Chronic systolic (congestive) heart failure: Secondary | ICD-10-CM | POA: Insufficient documentation

## 2018-08-24 DIAGNOSIS — Z992 Dependence on renal dialysis: Secondary | ICD-10-CM | POA: Diagnosis not present

## 2018-08-24 DIAGNOSIS — G8929 Other chronic pain: Secondary | ICD-10-CM | POA: Diagnosis not present

## 2018-08-24 DIAGNOSIS — Z888 Allergy status to other drugs, medicaments and biological substances status: Secondary | ICD-10-CM | POA: Insufficient documentation

## 2018-08-24 DIAGNOSIS — Z885 Allergy status to narcotic agent status: Secondary | ICD-10-CM | POA: Insufficient documentation

## 2018-08-24 LAB — BASIC METABOLIC PANEL
Anion gap: 9 (ref 5–15)
BUN: 10 mg/dL (ref 6–20)
CALCIUM: 7.6 mg/dL — AB (ref 8.9–10.3)
CO2: 25 mmol/L (ref 22–32)
Chloride: 101 mmol/L (ref 98–111)
Creatinine, Ser: 3.56 mg/dL — ABNORMAL HIGH (ref 0.44–1.00)
GFR calc Af Amer: 16 mL/min — ABNORMAL LOW (ref >60)
GFR calc non Af Amer: 14 mL/min — ABNORMAL LOW (ref >60)
Glucose, Bld: 81 mg/dL (ref 70–99)
Potassium: 3 mmol/L — ABNORMAL LOW (ref 3.5–5.1)
SODIUM: 135 mmol/L (ref 135–145)

## 2018-08-24 LAB — DIGOXIN LEVEL: Digoxin Level: 1.6 ng/mL (ref 0.8–2.0)

## 2018-08-24 LAB — PROTIME-INR
INR: 1.5 — ABNORMAL HIGH (ref 0.8–1.2)
Prothrombin Time: 18.1 seconds — ABNORMAL HIGH (ref 11.4–15.2)

## 2018-08-24 LAB — CBC
HCT: 33.4 % — ABNORMAL LOW (ref 36.0–46.0)
Hemoglobin: 10.5 g/dL — ABNORMAL LOW (ref 12.0–15.0)
MCH: 31.6 pg (ref 26.0–34.0)
MCHC: 31.4 g/dL (ref 30.0–36.0)
MCV: 100.6 fL — ABNORMAL HIGH (ref 80.0–100.0)
Platelets: 138 10*3/uL — ABNORMAL LOW (ref 150–400)
RBC: 3.32 MIL/uL — ABNORMAL LOW (ref 3.87–5.11)
RDW: 15.5 % (ref 11.5–15.5)
WBC: 3.4 10*3/uL — ABNORMAL LOW (ref 4.0–10.5)
nRBC: 0 % (ref 0.0–0.2)

## 2018-08-24 LAB — TROPONIN I
Troponin I: 0.07 ng/mL (ref <0.03)
Troponin I: 0.07 ng/mL (ref <0.03)

## 2018-08-24 MED ORDER — ACETAMINOPHEN 325 MG PO TABS
650.0000 mg | ORAL_TABLET | ORAL | Status: DC | PRN
  Administered 2018-08-25: 650 mg via ORAL
  Filled 2018-08-24: qty 2

## 2018-08-24 MED ORDER — ASPIRIN 81 MG PO CHEW
81.0000 mg | CHEWABLE_TABLET | Freq: Every day | ORAL | Status: DC
  Administered 2018-08-24 – 2018-08-25 (×2): 81 mg via ORAL
  Filled 2018-08-24 (×2): qty 1

## 2018-08-24 MED ORDER — HEPARIN SODIUM (PORCINE) 5000 UNIT/ML IJ SOLN: 5000.0000 [IU] | Freq: Three times a day (TID) | INTRAMUSCULAR | Status: DC

## 2018-08-24 MED ORDER — PANTOPRAZOLE SODIUM 40 MG PO TBEC
40.0000 mg | DELAYED_RELEASE_TABLET | Freq: Every day | ORAL | Status: DC
  Administered 2018-08-24 – 2018-08-25 (×2): 40 mg via ORAL
  Filled 2018-08-24 (×2): qty 1

## 2018-08-24 MED ORDER — SUCRALFATE 1 G PO TABS
1.0000 g | ORAL_TABLET | Freq: Two times a day (BID) | ORAL | Status: DC
  Administered 2018-08-24 – 2018-08-25 (×2): 1 g via ORAL
  Filled 2018-08-24 (×2): qty 1

## 2018-08-24 MED ORDER — SEVELAMER CARBONATE 800 MG PO TABS
3200.0000 mg | ORAL_TABLET | Freq: Three times a day (TID) | ORAL | Status: DC
  Administered 2018-08-24 – 2018-08-25 (×2): 3200 mg via ORAL
  Filled 2018-08-24 (×3): qty 4

## 2018-08-24 MED ORDER — APIXABAN 5 MG PO TABS
5.0000 mg | ORAL_TABLET | Freq: Two times a day (BID) | ORAL | Status: DC
  Administered 2018-08-24 – 2018-08-25 (×2): 5 mg via ORAL
  Filled 2018-08-24 (×2): qty 1

## 2018-08-24 MED ORDER — ISOSORBIDE MONONITRATE ER 60 MG PO TB24
60.0000 mg | ORAL_TABLET | Freq: Every day | ORAL | Status: DC
  Administered 2018-08-24 – 2018-08-25 (×2): 60 mg via ORAL
  Filled 2018-08-24 (×2): qty 1

## 2018-08-24 MED ORDER — ALBUTEROL SULFATE (2.5 MG/3ML) 0.083% IN NEBU: 2.5000 mg | INHALATION_SOLUTION | RESPIRATORY_TRACT | Status: DC | PRN

## 2018-08-24 MED ORDER — ISOSORBIDE MONONITRATE ER 30 MG PO TB24
30.0000 mg | ORAL_TABLET | Freq: Every day | ORAL | Status: DC
  Administered 2018-08-24 – 2018-08-25 (×2): 30 mg via ORAL
  Filled 2018-08-24 (×2): qty 1

## 2018-08-24 MED ORDER — ONDANSETRON HCL 4 MG/2ML IJ SOLN: 4.0000 mg | Freq: Four times a day (QID) | INTRAMUSCULAR | Status: DC | PRN

## 2018-08-24 MED ORDER — CINACALCET HCL 30 MG PO TABS
60.0000 mg | ORAL_TABLET | Freq: Every evening | ORAL | Status: DC
  Administered 2018-08-24: 60 mg via ORAL
  Filled 2018-08-24 (×2): qty 2

## 2018-08-24 MED ORDER — RANOLAZINE ER 500 MG PO TB12
500.0000 mg | ORAL_TABLET | Freq: Two times a day (BID) | ORAL | Status: DC
  Administered 2018-08-24 – 2018-08-25 (×2): 500 mg via ORAL
  Filled 2018-08-24 (×5): qty 1

## 2018-08-24 MED ORDER — SACUBITRIL-VALSARTAN 49-51 MG PO TABS
1.0000 | ORAL_TABLET | Freq: Two times a day (BID) | ORAL | Status: DC
  Administered 2018-08-25: 1 via ORAL
  Filled 2018-08-24 (×3): qty 1

## 2018-08-24 MED ORDER — CARVEDILOL 25 MG PO TABS
25.0000 mg | ORAL_TABLET | Freq: Two times a day (BID) | ORAL | Status: DC
  Administered 2018-08-25: 25 mg via ORAL
  Filled 2018-08-24: qty 1

## 2018-08-24 MED ORDER — LEVOTHYROXINE SODIUM 88 MCG PO TABS
88.0000 ug | ORAL_TABLET | Freq: Every day | ORAL | Status: DC
  Administered 2018-08-25: 88 ug via ORAL
  Filled 2018-08-24: qty 1

## 2018-08-24 MED ORDER — DIGOXIN 125 MCG PO TABS
0.0625 mg | ORAL_TABLET | Freq: Every day | ORAL | Status: DC
  Administered 2018-08-25: 0.0625 mg via ORAL
  Filled 2018-08-24 (×2): qty 0.5

## 2018-08-24 MED ORDER — HYDROXYZINE HCL 25 MG PO TABS: 50.0000 mg | ORAL_TABLET | Freq: Two times a day (BID) | ORAL | Status: DC | PRN

## 2018-08-24 NOTE — ED Notes (Signed)
ED TO INPATIENT HANDOFF REPORT  ED Nurse Name and Phone #: [phone]  S Name/Age/Gender [name] 58 y.o. female Room/Bed: ED10A/ED10A  Code Status   Code Status: Prior  Home/SNF/Other Home Patient oriented to: self, place, time and situation Is this baseline? Yes   Triage Complete: Triage complete  Chief Complaint chest pain ems  Triage Note Pt coems via [facility] from dialysis after waking up during dialysis with chest pain. Pt does have cardiac hx. Pt has had 324mg  aspirin and 532ml fluids. Pt did not finish dialysis treatment. 78/42 with EMS and HR was 48. CBG was 97. Pt on 4L chronically.    Allergies Allergies  Allergen Reactions  . Morphine And Related Hives  . Levaquin [Levofloxacin] Itching    Severe itching; prickly sensation  . Latex Rash  . Tape Rash    Level of Care/Admitting Diagnosis ED Disposition    ED Disposition Condition [facility] Area: [place] [[id number]]  Level of Care: Telemetry [5]  Diagnosis: Chest pain [924268]  Admitting Physician: Henreitta Leber [[id number]]  Attending Physician: Henreitta Leber [[id number]]  PT Class (Do Not Modify): Observation [104]  PT Acc Code (Do Not Modify): Observation [10022]       B Medical/Surgery History Past Medical History:  Diagnosis Date  . CHF (congestive heart failure) ([place])   . Heart failure ([place])   . Hyperlipidemia   . Hypertension   . Myocardial infarction ([place])   . Paroxysmal atrial fibrillation (HCC)   . Renal failure   . Renal insufficiency   . Stroke [facility]) 2011   Past Surgical History:  Procedure Laterality Date  . CORONARY ANGIOPLASTY WITH STENT PLACEMENT  2013  . CORONARY ARTERY BYPASS GRAFT    . DIALYSIS FISTULA CREATION    . LEFT HEART CATH AND CORS/GRAFTS ANGIOGRAPHY N/A 08/08/2018   Procedure: LEFT HEART CATH AND CORS/GRAFTS ANGIOGRAPHY;  Surgeon: Dionisio David, MD;  Location: [facility];  Service: Cardiovascular;   Laterality: N/A;     A IV Location/Drains/Wounds Patient Lines/Drains/Airways Status   Active Line/Drains/Airways    Name:   Placement date:   Placement time:   Site:   Days:   Peripheral IV 08/24/18 Right Hand   08/24/18    1007    Hand   less than 1   Fistula / Graft Left Upper arm Arteriovenous fistula   -    -    Upper arm      Fistula / Graft Left Upper arm Arteriovenous vein graft   -    -    Upper arm             Intake/Output Last 24 hours No intake or output data in the 24 hours ending 08/24/18 1553  Labs/Imaging Results for orders placed or performed during the hospital encounter of 08/24/18 (from the past 48 hour(s))  Basic metabolic panel     Status: Abnormal   Collection Time: 08/24/18 10:05 AM  Result Value Ref Range   Sodium 135 135 - 145 mmol/L   Potassium 3.0 (L) 3.5 - 5.1 mmol/L   Chloride 101 98 - 111 mmol/L   CO2 25 22 - 32 mmol/L   Glucose, Bld 81 70 - 99 mg/dL   BUN 10 6 - 20 mg/dL   Creatinine, Ser 3.56 (H) 0.44 - 1.00 mg/dL   Calcium 7.6 (L) 8.9 - 10.3 mg/dL   GFR calc non Af Amer 14 (L) >60  mL/min   GFR calc Af Amer 16 (L) >60 mL/min   Anion gap 9 5 - 15    Comment: Performed at [facility], [address] [place], [place] [address]  CBC     Status: Abnormal   Collection Time: 08/24/18 10:05 AM  Result Value Ref Range   WBC 3.4 (L) 4.0 - 10.5 K/uL   RBC 3.32 (L) 3.87 - 5.11 MIL/uL   Hemoglobin 10.5 (L) 12.0 - 15.0 g/dL   HCT 33.4 (L) 36.0 - 46.0 %   MCV 100.6 (H) 80.0 - 100.0 fL   MCH 31.6 26.0 - 34.0 pg   MCHC 31.4 30.0 - 36.0 g/dL   RDW 15.5 11.5 - 15.5 %   Platelets 138 (L) 150 - 400 K/uL   nRBC 0.0 0.0 - 0.2 %    Comment: Performed at [facility], [address] [place], [place] [address]  Troponin I - ONCE - STAT     Status: Abnormal   Collection Time: 08/24/18 10:05 AM  Result Value Ref Range   Troponin I 0.07 (HH) <0.03 ng/mL    Comment: CRITICAL RESULT CALLED TO, READ BACK BY AND VERIFIED WITH  JESSICA  COLTRANE AT 0981 08/24/18 SDR Performed at [facility], [address], [place], [place] [address]   Protime-INR (order if Patient is taking Coumadin / Warfarin)     Status: Abnormal   Collection Time: 08/24/18 10:05 AM  Result Value Ref Range   Prothrombin Time 18.1 (H) 11.4 - 15.2 seconds   INR 1.5 (H) 0.8 - 1.2    Comment: (NOTE) INR goal varies based on device and disease states. Performed at [facility], [address] [place], [place] [address]    Dg Chest 2 View  Result Date: 08/24/2018 CLINICAL DATA:  Chest pain EXAM: CHEST - 2 VIEW COMPARISON:  08/05/2018 FINDINGS: Cardiomegaly. Diffuse interstitial coarsening with a few Kerley lines. No pleural effusion. CABG and extensive coronary atherosclerotic calcification and/or stenting. IMPRESSION: Mild interstitial edema. Electronically Signed   By: Monte Fantasia M.D.   On: 08/24/2018 11:00    Pending Labs Unresulted Labs (From admission, onward)    Start     Ordered   08/24/18 1139  Digoxin level  Add-on,   AD     08/24/18 1138   Signed and Held  Troponin I - Now Then Q4H  Now then every 4 hours,   TIMED     Signed and Held   Signed and Held  CBC  (heparin)  Once,   R    Comments:  Baseline for heparin therapy IF NOT ALREADY DRAWN.  Notify MD if PLT < 100 K.    Signed and Held   Signed and Held  Creatinine, serum  (heparin)  Once,   R    Comments:  Baseline for heparin therapy IF NOT ALREADY DRAWN.    Signed and Held          Vitals/Pain Today's Vitals   08/24/18 1002 08/24/18 1007 08/24/18 1030 08/24/18 1357  BP: 103/64  115/63 (!) 102/56  Pulse: 75  61 69  Resp: 18 16 16 14   Temp: 98.1 F (36.7 C)     TempSrc: Oral     SpO2: 98% 99% 93% 100%  Weight:      Height:      PainSc:        Isolation Precautions No active isolations  Medications Medications  isosorbide mononitrate (IMDUR) 24 hr tablet 60 mg (  60 mg Oral Given 08/24/18 1357)  ranolazine (RANEXA) 12 hr tablet 500 mg (has no  administration in time range)    Mobility walks Low fall risk   Focused Assessments   R Recommendations: See Admitting Provider Note  Report given to:   Additional Notes:

## 2018-08-24 NOTE — Consult Note (Signed)
[name] is a 58 y.o. female  [id number]  Primary Cardiologist: Dr. Neoma Laming Reason for Consultation: Chest pain  HPI: 58yo female with a past medical history of CABG x3, systolic CHF, paroxysmal afib, renal failure on dialysis, and HTN presented to ER with chest pain and mildly elevated troponin of 0.07. She had a cardiac cath 2.5 weeks ago which showed her bypass grafts were patent and she was started on  Isosorbide. Echo done last week showed EF 35-50%, combined diastolic/systolic heart failure, severe pulmonary HTN, and mild-moderate MR.   Review of Systems: Reports 7/10 chest pain.    Past Medical History:  Diagnosis Date  . CHF (congestive heart failure) ([place])   . Heart failure ([place])   . Hyperlipidemia   . Hypertension   . Myocardial infarction ([place])   . Paroxysmal atrial fibrillation (HCC)   . Renal failure   . Renal insufficiency   . Stroke [facility]) 2011    (Not in a hospital admission)      Infusions:   Allergies  Allergen Reactions  . Morphine And Related Hives  . Levaquin [Levofloxacin] Itching    Severe itching; prickly sensation  . Latex Rash  . Tape Rash    Social History   Socioeconomic History  . Marital status: Single    Spouse name: Not on file  . Number of children: Not on file  . Years of education: Not on file  . Highest education level: Not on file  Occupational History  . Not on file  Social Needs  . Financial resource strain: Not on file  . Food insecurity:    Worry: Not on file    Inability: Not on file  . Transportation needs:    Medical: Not on file    Non-medical: Not on file  Tobacco Use  . Smoking status: Current Some Day Smoker    Packs/day: 0.10    Years: 20.00    Pack years: 2.00    Types: Cigarettes  . Smokeless tobacco: Never Used  Substance and Sexual Activity  . Alcohol use: No  . Drug use: No  . Sexual activity: Not Currently  Lifestyle  . Physical activity:    Days per week: Not on file    Minutes  per session: Not on file  . Stress: Not on file  Relationships  . Social connections:    Talks on phone: Not on file    Gets together: Not on file    Attends religious service: Not on file    Active member of club or organization: Not on file    Attends meetings of clubs or organizations: Not on file    Relationship status: Not on file  . Intimate partner violence:    Fear of current or ex partner: Not on file    Emotionally abused: Not on file    Physically abused: Not on file    Forced sexual activity: Not on file  Other Topics Concern  . Not on file  Social History Narrative  . Not on file    Family History  Problem Relation Age of Onset  . Hypertension Other   . Cancer Other   . Renal Disease Other   . Ovarian cancer Mother   . Kidney disease Father     PHYSICAL EXAM: Vitals:   08/24/18 1007 08/24/18 1030  BP:  115/63  Pulse:  61  Resp: 16 16  Temp:    SpO2: 99% 93%    No intake or  output data in the 24 hours ending 08/24/18 1208  General:  Well appearing. Chronic 3L oxygen  HEENT: normal Neck: supple. no JVD. Carotids 2+ bilat; no bruits. No lymphadenopathy or thryomegaly appreciated. Cor: PMI nondisplaced. Regular rate & rhythm. No rubs, gallops or murmurs. Lungs: clear Abdomen: soft, nontender, nondistended. No hepatosplenomegaly. No bruits or masses. Good bowel sounds. Extremities: no cyanosis, clubbing, rash, edema Neuro: alert & oriented x 3, cranial nerves grossly intact. moves all 4 extremities w/o difficulty. Affect pleasant.  ECG: Sinus rhythm 66bpm Atrial premature complex Prolonged PR interval Right bundle branch block Inferior infarct, old Lateral leads are also involved  Results for orders placed or performed during the hospital encounter of 08/24/18 (from the past 24 hour(s))  Basic metabolic panel     Status: Abnormal   Collection Time: 08/24/18 10:05 AM  Result Value Ref Range   Sodium 135 135 - 145 mmol/L   Potassium 3.0 (L) 3.5 -  5.1 mmol/L   Chloride 101 98 - 111 mmol/L   CO2 25 22 - 32 mmol/L   Glucose, Bld 81 70 - 99 mg/dL   BUN 10 6 - 20 mg/dL   Creatinine, Ser 3.56 (H) 0.44 - 1.00 mg/dL   Calcium 7.6 (L) 8.9 - 10.3 mg/dL   GFR calc non Af Amer 14 (L) >60 mL/min   GFR calc Af Amer 16 (L) >60 mL/min   Anion gap 9 5 - 15  CBC     Status: Abnormal   Collection Time: 08/24/18 10:05 AM  Result Value Ref Range   WBC 3.4 (L) 4.0 - 10.5 K/uL   RBC 3.32 (L) 3.87 - 5.11 MIL/uL   Hemoglobin 10.5 (L) 12.0 - 15.0 g/dL   HCT 33.4 (L) 36.0 - 46.0 %   MCV 100.6 (H) 80.0 - 100.0 fL   MCH 31.6 26.0 - 34.0 pg   MCHC 31.4 30.0 - 36.0 g/dL   RDW 15.5 11.5 - 15.5 %   Platelets 138 (L) 150 - 400 K/uL   nRBC 0.0 0.0 - 0.2 %  Troponin I - ONCE - STAT     Status: Abnormal   Collection Time: 08/24/18 10:05 AM  Result Value Ref Range   Troponin I 0.07 (HH) <0.03 ng/mL  Protime-INR (order if Patient is taking Coumadin / Warfarin)     Status: Abnormal   Collection Time: 08/24/18 10:05 AM  Result Value Ref Range   Prothrombin Time 18.1 (H) 11.4 - 15.2 seconds   INR 1.5 (H) 0.8 - 1.2   Dg Chest 2 View  Result Date: 08/24/2018 CLINICAL DATA:  Chest pain EXAM: CHEST - 2 VIEW COMPARISON:  08/05/2018 FINDINGS: Cardiomegaly. Diffuse interstitial coarsening with a few Kerley lines. No pleural effusion. CABG and extensive coronary atherosclerotic calcification and/or stenting. IMPRESSION: Mild interstitial edema. Electronically Signed   By: Monte Fantasia M.D.   On: 08/24/2018 11:00     ASSESSMENT AND PLAN:  Chest pain: Troponin mildly elevated although she has history of 0.06 elevations and I suspect they are chronic due to dialysis dependent renal failure. Advise trending troponin x 3.  Cardiac cath done 2.5 weeks ago showed patent bypass grafts. Will increase isosorbide and add Ranexa 500mg  BID. If troponin is stable, may discharge from cardiac perspective and follow up outpatient with Dr. Humphrey Rolls.  CHF: Continue Entresto,  carvedilol, and digoxin. Will draw digoxin level.   Afib: Sinus rhythm, but with frequent PVCs and PACs. Continue eliquis.   Jake Bathe, NP-C Cell: [phone]

## 2018-08-24 NOTE — ED Notes (Signed)
Pt was given sandwich tray with ginger ale.

## 2018-08-24 NOTE — ED Triage Notes (Signed)
Pt coems via [facility] from dialysis after waking up during dialysis with chest pain. Pt does have cardiac hx. Pt has had 324mg  aspirin and 538ml fluids. Pt did not finish dialysis treatment. 78/42 with EMS and HR was 48. CBG was 97. Pt on 4L chronically.

## 2018-08-24 NOTE — ED Provider Notes (Signed)
[facility] Emergency Department Provider Note  ____________________________________________   I have reviewed the triage vital signs and the nursing notes.   HISTORY  Chief Complaint Chest Pain   History limited by: Not Limited   HPI [name] is a 58 y.o. female who presents to the emergency department today because of concerns for chest pain.  Located in her left chest.  She describes it as sharp.  It started while she was getting dialysis today.  By the time my examination it has eased off.  Does remind her of previous heart attacks.  Has a history of CABG states last heart attack was in 2015.  She did have some shortness of breath with the pain although denies any diaphoresis. Did receive 4 baby aspirin.   Per medical record review patient has a history of MI, CHF, ESRD on dialysis.   Past Medical History:  Diagnosis Date  . CHF (congestive heart failure) ([place])   . Heart failure ([place])   . Hyperlipidemia   . Hypertension   . Myocardial infarction ([place])   . Paroxysmal atrial fibrillation (HCC)   . Renal failure   . Renal insufficiency   . Stroke [facility]) 2011    Patient Active Problem List   Diagnosis Date Noted  . Non-STEMI (non-ST elevated myocardial infarction) ([place]) 08/07/2018  . Pneumonia 01/31/2018  . Swelling of limb 09/22/2016  . Kidney dialysis as the cause of abnormal reaction of the patient, or of later complication, without mention of misadventure at the time of the procedure (CODE) 03/24/2016  . Hyperkalemia 11/13/2014  . Generalized weakness 10/15/2014  . Chest pain 10/15/2014  . ESRD on hemodialysis ([place]) 10/15/2014  . Congestive heart failure (CHF) ([place]) 10/15/2014  . HTN (hypertension) 10/15/2014  . Sternal wound dehiscence 04/12/2014  . 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 Pericardial effusion 04/12/2014  . History of delirium 04/12/2014  . H/O atrial flutter 04/12/2014  . Delayed surgical wound healing  04/12/2014  . Chest wall pain following surgery 04/12/2014  . S/P CABG (coronary artery bypass graft) 04/02/2014  . Lactic acidosis 03/26/2014  . Arteriosclerotic dementia ([place]) 03/25/2014  . Atrial flutter by electrocardiogram ([place]) 03/22/2014  . Superficial incisional surgical site infection 03/14/2014  . Postoperative anemia due to acute blood loss 02/27/2014  . Obesity 02/27/2014  . H/O: substance abuse ([place]) 02/27/2014  . Anemia of chronic renal failure 02/27/2014  . Acute postoperative pain 02/27/2014  . Leukocytosis 02/26/2014  . Hypotension 02/24/2014  . Exhausted vascular access 02/24/2014  . Anemia of chronic disease 02/24/2014  . Stroke ([place]) 02/21/2014  . S/P CABG x 3 02/21/2014  . Dialysis patient ([place]) 02/21/2014  . Hyperlipidemia 02/13/2014  . History of hepatitis C 02/13/2014  . HFrEF (heart failure with reduced ejection fraction) ([place]) 02/13/2014  . H/O stroke without residual deficits 02/13/2014  . ESRD (end stage renal disease) on dialysis ([place]) 02/13/2014  . End stage renal disease with hypertension ([place]) 02/13/2014  . CAD, multiple vessel 02/13/2014  . Retrosternal chest pain 05/30/2013  . Gallstones 05/29/2013    Past Surgical History:  Procedure Laterality Date  . CORONARY ANGIOPLASTY WITH STENT PLACEMENT  2013  . CORONARY ARTERY BYPASS GRAFT    . DIALYSIS FISTULA CREATION    . LEFT HEART CATH AND CORS/GRAFTS ANGIOGRAPHY N/A 08/08/2018   Procedure: LEFT HEART CATH AND CORS/GRAFTS ANGIOGRAPHY;  Surgeon: Dionisio David, MD;  Location: [facility];  Service: Cardiovascular;  Laterality: N/A;    Prior to  Admission medications   Medication Sig Start Date End Date Taking? Authorizing Provider  albuterol (PROVENTIL HFA;VENTOLIN HFA) 108 (90 Base) MCG/ACT inhaler Inhale 2 puffs into the lungs every 4 (four) hours as needed for wheezing or shortness of breath.    [provider]  aspirin 81 MG chewable tablet Chew 81 mg by mouth daily.     [provider]  carvedilol (COREG) 12.5 MG tablet Take 12.5 mg by mouth 2 (two) times daily with a meal.     [provider]  cinacalcet (SENSIPAR) 60 MG tablet Take 60 mg by mouth every evening.     [provider]  digoxin (LANOXIN) 0.125 MG tablet Take 0.0625 mg by mouth daily. 06/17/18   [provider]  ELIQUIS 5 MG TABS tablet Take 1 tablet (5 mg total) by mouth 2 (two) times daily. 02/02/18   Mayo, Pete Pelt, MD  hydrOXYzine (ATARAX/VISTARIL) 50 MG tablet Take 50 mg by mouth 2 (two) times daily as needed for itching.     [provider]  isosorbide mononitrate (IMDUR) 30 MG 24 hr tablet Take 1 tablet (30 mg total) by mouth daily for 30 days. 08/08/18 09/07/18  Saundra Shelling, MD  levothyroxine (SYNTHROID, LEVOTHROID) 88 MCG tablet Take 88 mcg by mouth at bedtime.  03/03/18   [provider]  midodrine (PROAMATINE) 10 MG tablet Take 10-20 mg by mouth daily. (for low BP associated with dialysis)    [provider]  sacubitril-valsartan (ENTRESTO) 49-51 MG Take 1 tablet by mouth 2 (two) times daily.     [provider]  sevelamer carbonate (RENVELA) 800 MG tablet Take 4 tablets (3200MG ) by mouth 3 times daily with meals and 2 tablets (1600MG ) by mouth twice daily with snacks    [provider]  sucralfate (CARAFATE) 1 g tablet Take 1 g by mouth 2 (two) times daily. 07/07/18   [provider]    Allergies Morphine and related; Levaquin [levofloxacin]; Latex; and Tape  Family History  Problem Relation Age of Onset  . Hypertension Other   . Cancer Other   . Renal Disease Other   . Ovarian cancer Mother   . Kidney disease Father     Social History Social History   Tobacco Use  . Smoking status: Current Some Day Smoker    Packs/day: 0.10    Years: 20.00    Pack years: 2.00    Types: Cigarettes  . Smokeless tobacco: Never Used  Substance Use Topics  . Alcohol use: No  . Drug use: No    Review of  Systems Constitutional: No fever/chills Eyes: No visual changes. ENT: No sore throat. Cardiovascular: Positive  chest pain. Respiratory: Positive shortness of breath. Gastrointestinal: No abdominal pain.  Positive for diarrhea.  Genitourinary: Negative for dysuria. Musculoskeletal: Negative for back pain. Skin: Negative for rash. Neurological: Negative for headaches, focal weakness or numbness.  ____________________________________________   PHYSICAL EXAM:  VITAL SIGNS: ED Triage Vitals  Enc Vitals Group     BP 08/24/18 1002 103/64     Pulse Rate 08/24/18 1002 75     Resp 08/24/18 1002 18     Temp 08/24/18 1002 98.1 F (36.7 C)     Temp Source 08/24/18 1002 Oral     SpO2 08/24/18 1002 98 %     Weight 08/24/18 0959 169 lb 12.1 oz (77 kg)     Height 08/24/18 0959 5\' 6"  (1.676 m)     Head Circumference --  Peak Flow --      Pain Score 08/24/18 0958 9   Constitutional: Alert and oriented.  Eyes: Conjunctivae are normal.  ENT      Head: Normocephalic and atraumatic.      Nose: No congestion/rhinnorhea.      Mouth/Throat: Mucous membranes are moist.      Neck: No stridor. Hematological/Lymphatic/Immunilogical: No cervical lymphadenopathy. Cardiovascular: Normal rate, regular rhythm.  No murmurs, rubs, or gallops.  Respiratory: Normal respiratory effort without tachypnea nor retractions. Breath sounds are clear and equal bilaterally. No wheezes/rales/rhonchi. Gastrointestinal: Soft and non tender. No rebound. No guarding.  Genitourinary: Deferred Musculoskeletal: Normal range of motion in all extremities. No lower extremity edema. Neurologic:  Normal speech and language. No gross focal neurologic deficits are appreciated.  Skin:  Skin is warm, dry and intact. No rash noted. Psychiatric: Mood and affect are normal. Speech and behavior are normal. Patient exhibits appropriate insight and judgment.  ____________________________________________    LABS (pertinent  positives/negatives)  Trop <0.07 CBC wbc 3.4, hgb 10.5, plt 138 BMP na 135, k 3.0, cr 3.56 INR 1.5  ____________________________________________   EKG  I, Nance Pear, attending physician, personally viewed and interpreted this EKG  EKG Time: 1006 Rate: 66 Rhythm: normal sinus rhythm Axis: normal Intervals: qtc 404 QRS: RBBB ST changes: no st elevation Impression: abnormal ekg   ____________________________________________    RADIOLOGY  CXR Mild interstitial edema  ____________________________________________   PROCEDURES  Procedures  ____________________________________________   INITIAL IMPRESSION / ASSESSMENT AND PLAN / ED COURSE  Pertinent labs & imaging results that were available during my care of the patient were reviewed by me and considered in my medical decision making (see chart for details).   Patient presented to the emergency department today because of concerns for chest pain while at dialysis.  Patient does have a history of significant heart disease.  EKG without any ST elevation.  Initial troponin was mildly elevated at 0.07.  Patient does have what appears to be some baseline elevation likely secondary to dialysis.  Patient has already received aspirin.  Given high risk will plan admission to the hospital service.  Discussed this with the patient.  ____________________________________________   FINAL CLINICAL IMPRESSION(S) / ED DIAGNOSES  Final diagnoses:  Other chest pain  Elevated troponin     Note: This dictation was prepared with Dragon dictation. Any transcriptional errors that result from this process are unintentional     Nance Pear, MD 08/24/18 1137

## 2018-08-24 NOTE — H&P (Signed)
[place] [facility] [place] NAME: [name]    MR#:  [id number]  DATE OF BIRTH:  10-15-1960  DATE OF ADMISSION:  08/24/2018  PRIMARY CARE PHYSICIAN: [name], MD   REQUESTING/REFERRING PHYSICIAN: Dr. Nance Pear  CHIEF COMPLAINT:   Chief Complaint  Patient presents with  . Chest Pain    HISTORY OF PRESENT ILLNESS:  [name]  is a 58 y.o. female with a known history of end-stage renal disease on hemodialysis, paroxysmal atrial fibrillation, hypertension, hyperlipidemia, secondary hyperparathyroidism, history of previous CVA who presents to the hospital from dialysis complaining of chest pain.  Patient says she was in her usual state of health and went to dialysis this morning and 2 hours into her treatment she started developed sharp chest pain located substernally but nonradiating.  She had associated shortness of breath and intermittent nausea but no vomiting.  She was sent to the ER for further evaluation.  Patient had similar admission last month and was seen by her cardiologist underwent cardiac catheterization which showed patent grafts from her previous bypass surgery and she was started on Imdur.  She presents to the ER and was noted to have a mildly elevated troponin at 0.07 and therefore hospitalist services were contacted for admission.  PAST MEDICAL HISTORY:   Past Medical History:  Diagnosis Date  . CHF (congestive heart failure) ([place])   . Heart failure ([place])   . Hyperlipidemia   . Hypertension   . Myocardial infarction ([place])   . Paroxysmal atrial fibrillation ([facility])   . Renal failure   . Renal insufficiency   . Stroke [facility]) 2011    PAST SURGICAL HISTORY:   Past Surgical History:  Procedure Laterality Date  . CORONARY ANGIOPLASTY WITH STENT PLACEMENT  2013  . CORONARY ARTERY BYPASS GRAFT    . DIALYSIS FISTULA CREATION    . LEFT HEART CATH AND CORS/GRAFTS ANGIOGRAPHY N/A 08/08/2018   Procedure: LEFT HEART  CATH AND CORS/GRAFTS ANGIOGRAPHY;  Surgeon: Dionisio David, MD;  Location: [facility];  Service: Cardiovascular;  Laterality: N/A;    SOCIAL HISTORY:   Social History   Tobacco Use  . Smoking status: Current Some Day Smoker    Packs/day: 0.10    Years: 20.00    Pack years: 2.00    Types: Cigarettes  . Smokeless tobacco: Never Used  Substance Use Topics  . Alcohol use: No    FAMILY HISTORY:   Family History  Problem Relation Age of Onset  . Hypertension Other   . Cancer Other   . Renal Disease Other   . Ovarian cancer Mother   . Kidney disease Father     DRUG ALLERGIES:   Allergies  Allergen Reactions  . Morphine And Related Hives  . Levaquin [Levofloxacin] Itching    Severe itching; prickly sensation  . Latex Rash  . Tape Rash    REVIEW OF SYSTEMS:   Review of Systems  Constitutional: Negative for fever and weight loss.  HENT: Negative for congestion, nosebleeds and tinnitus.   Eyes: Negative for blurred vision, double vision and redness.  Respiratory: Negative for cough, hemoptysis and shortness of breath.   Cardiovascular: Positive for chest pain. Negative for orthopnea, leg swelling and PND.  Gastrointestinal: Negative for abdominal pain, diarrhea, melena, nausea and vomiting.  Genitourinary: Negative for dysuria, hematuria and urgency.  Musculoskeletal: Negative for falls and joint pain.  Neurological: Negative for dizziness, tingling, sensory change, focal weakness, seizures, weakness  and headaches.  Endo/Heme/Allergies: Negative for polydipsia. Does not bruise/bleed easily.  Psychiatric/Behavioral: Negative for depression and memory loss. The patient is not nervous/anxious.     MEDICATIONS AT HOME:   Prior to Admission medications   Medication Sig Start Date End Date Taking? Authorizing Provider  aspirin 81 MG chewable tablet Chew 81 mg by mouth daily.   Yes [provider]  carvedilol (COREG) 25 MG tablet Take 25 mg by mouth 2  (two) times daily with a meal.    Yes [provider]  cinacalcet (SENSIPAR) 60 MG tablet Take 60 mg by mouth every evening.    Yes [provider]  digoxin (LANOXIN) 0.125 MG tablet Take 0.0625 mg by mouth daily. 06/17/18  Yes [provider]  ELIQUIS 5 MG TABS tablet Take 1 tablet (5 mg total) by mouth 2 (two) times daily. 02/02/18  Yes Mayo, Pete Pelt, MD  isosorbide mononitrate (IMDUR) 30 MG 24 hr tablet Take 1 tablet (30 mg total) by mouth daily for 30 days. 08/08/18 09/07/18 Yes Pyreddy, Reatha Harps, MD  levothyroxine (SYNTHROID, LEVOTHROID) 88 MCG tablet Take 88 mcg by mouth at bedtime.  03/03/18  Yes [provider]  midodrine (PROAMATINE) 10 MG tablet Take 10-20 mg by mouth daily. (for low BP associated with dialysis)   Yes [provider]  pantoprazole (PROTONIX) 40 MG tablet Take 40 mg by mouth daily.   Yes [provider]  sacubitril-valsartan (ENTRESTO) 49-51 MG Take 1 tablet by mouth 2 (two) times daily.    Yes [provider]  sevelamer carbonate (RENVELA) 800 MG tablet Take 4 tablets (3200MG ) by mouth 3 times daily with meals and 2 tablets (1600MG ) by mouth twice daily with snacks   Yes [provider]  albuterol (PROVENTIL HFA;VENTOLIN HFA) 108 (90 Base) MCG/ACT inhaler Inhale 2 puffs into the lungs every 4 (four) hours as needed for wheezing or shortness of breath.    [provider]  hydrOXYzine (ATARAX/VISTARIL) 50 MG tablet Take 50 mg by mouth 2 (two) times daily as needed for itching.     [provider]  sucralfate (CARAFATE) 1 g tablet Take 1 g by mouth 2 (two) times daily. 07/07/18   [provider]      VITAL SIGNS:  Blood pressure 115/63, pulse 61, temperature 98.1 F (36.7 C), temperature source Oral, resp. rate 16, height 5\' 6"  (1.676 m), weight 77 kg, SpO2 93 %.  PHYSICAL EXAMINATION:  Physical Exam  GENERAL:  58 y.o.-year-old patient lying in the bed in no acute distress.   EYES: Pupils equal, round, reactive to light and accommodation. No scleral icterus. Extraocular muscles intact.  HEENT: Head atraumatic, normocephalic. Oropharynx and nasopharynx clear. No oropharyngeal erythema, moist oral mucosa  NECK:  Supple, no jugular venous distention. No thyroid enlargement, no tenderness.  LUNGS: Normal breath sounds bilaterally, no wheezing, rales, rhonchi. No use of accessory muscles of respiration.  CARDIOVASCULAR: S1, S2 RRR. No murmurs, rubs, gallops, clicks.  Mid-Sternal scar from previous bypass surgery. ABDOMEN: Soft, nontender, nondistended. Bowel sounds present. No organomegaly or mass.  EXTREMITIES: No pedal edema, cyanosis, or clubbing. + 2 pedal & radial pulses b/l.   NEUROLOGIC: Cranial nerves II through XII are intact. No focal Motor or sensory deficits appreciated b/l PSYCHIATRIC: The patient is alert and oriented x 3. Good affect.  SKIN: No obvious rash, lesion, or ulcer.   Left upper extremity AV fistula with good bruit and thrill.  LABORATORY PANEL:   CBC Recent Labs  Lab  08/24/18 1005  WBC 3.4*  HGB 10.5*  HCT 33.4*  PLT 138*   ------------------------------------------------------------------------------------------------------------------  Chemistries  Recent Labs  Lab 08/24/18 1005  NA 135  K 3.0*  CL 101  CO2 25  GLUCOSE 81  BUN 10  CREATININE 3.56*  CALCIUM 7.6*   ------------------------------------------------------------------------------------------------------------------  Cardiac Enzymes Recent Labs  Lab 08/24/18 1005  TROPONINI 0.07*   ------------------------------------------------------------------------------------------------------------------  RADIOLOGY:  Dg Chest 2 View  Result Date: 08/24/2018 CLINICAL DATA:  Chest pain EXAM: CHEST - 2 VIEW COMPARISON:  08/05/2018 FINDINGS: Cardiomegaly. Diffuse interstitial coarsening with a few Kerley lines. No pleural effusion. CABG and extensive coronary  atherosclerotic calcification and/or stenting. IMPRESSION: Mild interstitial edema. Electronically Signed   By: Monte Fantasia M.D.   On: 08/24/2018 11:00     IMPRESSION AND PLAN:   58 year old female with past medical history of end-stage renal disease on hemodialysis, hypertension, hyperlipidemia, coronary artery disease status post bypass, secondary hyperparathyroidism, history of previous CVA who presents to the hospital due to chest pain.  1.  Chest pain-patient does have significant risk factors given her hypertension, coronary artery disease and being on dialysis. - We will observe her overnight on telemetry, cycle her cardiac markers.  Her first troponin is mildly elevated.  We will get cardiology consult. - Patient had recent cardiac catheterization done and therefore will not order stress test any further imaging and will defer this to cardiology. -Continue aspirin, Imdur, beta-blocker, statin.  2.  End-stage renal disease on hemodialysis- patient gets dialysis on Monday Wednesday Friday. -Patient had partial dialysis today and was sent to the ER.  If patient is here more than 24 hours would consult nephrology for further dialysis.  3.  History of paroxysmal atrial fibrillation-rate controlled.  Continue carvedilol, digoxin. -I will check a digoxin level.  Continue Eliquis.  4.  Secondary hyperparathyroidism-continue Sensipar, Renvela.  5.  History of chronic systolic CHF- clinically patient is not in congestive heart failure. -Continue carvedilol, Entresto.  6.  GERD-continue Protonix.  7.  Hypothyroidism-continue Synthroid.    All the records are reviewed and case discussed with ED provider. Management plans discussed with the patient, family and they are in agreement.  CODE STATUS: Full code  TOTAL TIME TAKING CARE OF THIS PATIENT: 40 minutes.    Henreitta Leber M.D on 08/24/2018 at 11:40 AM  Between 7am to 6pm - Pager - [phone]  After 6pm go to  [email or web address] - password [email or web address] [facility]  Office  [phone]  CC: Primary care physician; [name], MD

## 2018-08-24 NOTE — ED Notes (Signed)
Patient transported to X-ray 

## 2018-08-25 DIAGNOSIS — R0789 Other chest pain: Secondary | ICD-10-CM | POA: Diagnosis not present

## 2018-08-25 LAB — TROPONIN I
TROPONIN I: 0.07 ng/mL — AB (ref <0.03)
Troponin I: 0.07 ng/mL (ref <0.03)

## 2018-08-25 MED ORDER — RANOLAZINE ER 500 MG PO TB12: 500.0000 mg | ORAL_TABLET | Freq: Two times a day (BID) | ORAL | 0 refills | Status: DC

## 2018-08-25 MED ORDER — OXYCODONE-ACETAMINOPHEN 5-325 MG PO TABS: 1.0000 | ORAL_TABLET | Freq: Three times a day (TID) | ORAL | 0 refills | Status: AC | PRN

## 2018-08-25 NOTE — Care Management CC44 (Deleted)
Condition Code 44 Documentation Completed  Patient Details  Name: [name] MRN: [id number] Date of Birth: [birth year]   Condition Code 44 given:    Patient signature on Condition Code 44 notice:    Documentation of 2 MD's agreement:    Code 44 added to claim:       Elza Rafter, RN 08/25/2018, 2:18 PM

## 2018-08-25 NOTE — Progress Notes (Signed)
SUBJECTIVE: Feeling better, chest pain nearly resolved.  Vitals:   08/24/18 2006 08/24/18 2158 08/25/18 0353 08/25/18 0923  BP: (!) 83/50 107/64 101/65 (!) 103/53  Pulse: 61 89 (!) 52 60  Resp: 16  16   Temp: 98.3 F (36.8 C)  97.8 F (36.6 C) 98.3 F (36.8 C)  TempSrc: Oral  Oral   SpO2: 100% 99% 100% 99%  Weight:   77.2 kg   Height:        Intake/Output Summary (Last 24 hours) at 08/25/2018 1110 Last data filed at 08/24/2018 2146 Gross per 24 hour  Intake 0 ml  Output 1 ml  Net -1 ml    LABS: Basic Metabolic Panel: Recent Labs    08/24/18 1005  NA 135  K 3.0*  CL 101  CO2 25  GLUCOSE 81  BUN 10  CREATININE 3.56*  CALCIUM 7.6*   Liver Function Tests: No results for input(s): AST, ALT, ALKPHOS, BILITOT, PROT, ALBUMIN in the last 72 hours. No results for input(s): LIPASE, AMYLASE in the last 72 hours. CBC: Recent Labs    08/24/18 1005  WBC 3.4*  HGB 10.5*  HCT 33.4*  MCV 100.6*  PLT 138*   Cardiac Enzymes: Recent Labs    08/24/18 1740 08/24/18 2328 08/25/18 0311  TROPONINI 0.07* 0.07* 0.07*   BNP: Invalid input(s): POCBNP D-Dimer: No results for input(s): DDIMER in the last 72 hours. Hemoglobin A1C: No results for input(s): HGBA1C in the last 72 hours. Fasting Lipid Panel: No results for input(s): CHOL, HDL, LDLCALC, TRIG, CHOLHDL, LDLDIRECT in the last 72 hours. Thyroid Function Tests: No results for input(s): TSH, T4TOTAL, T3FREE, THYROIDAB in the last 72 hours.  Invalid input(s): FREET3 Anemia Panel: No results for input(s): VITAMINB12, FOLATE, FERRITIN, TIBC, IRON, RETICCTPCT in the last 72 hours.   PHYSICAL EXAM General: Well developed, well nourished, in no acute distress HEENT:  Normocephalic and atramatic Neck:  No JVD.  Lungs: Clear bilaterally to auscultation and percussion. Heart: HRRR . Normal S1 and S2 without gallops or murmurs.  Abdomen: Bowel sounds are positive, abdomen soft and non-tender  Msk:  Back normal, normal  gait. Normal strength and tone for age. Extremities: No clubbing, cyanosis or edema.   Neuro: Alert and oriented X 3. Psych:  Good affect, responds appropriately  TELEMETRY: Sinus bradycardia 55bpm  ASSESSMENT AND PLAN: Chest pain: Pts chest pain is nearly resolved. Troponin level stable at 0.07, likely due to dialysis dependent renal failure. Cardiac cath done several weeks ago showed patent bypass grafts.  Continue isosorbide 60mg  and Ranexa and may discharge with outpatient follow up Monday 1pm at [place].   Active Problems:   Chest pain    Jake Bathe, NP-C 08/25/2018 11:10 AM Cell: [phone]

## 2018-08-25 NOTE — Progress Notes (Signed)
[facility], [place] 08/25/18  Subjective:   Patient was admitted post dialysis yesterday for chest pain that occurred during dialysis.  Blood pressure was low and she was given 500 cc of fluid bolus.  At dialysis, no significant amount of fluid was removed and this patient came in under her dry weight She still feels like she has a dull ache in her left chest area. She has been evaluated by cardiology team  Objective:  Vital signs in last 24 hours:  Temp:  [97.7 F (36.5 C)-98.3 F (36.8 C)] 98.3 F (36.8 C) (03/12 0923) Pulse Rate:  [52-89] 60 (03/12 0923) Resp:  [16-18] 16 (03/12 0353) BP: (83-108)/(50-65) 103/53 (03/12 0923) SpO2:  [98 %-100 %] 99 % (03/12 0923) Weight:  [77.1 kg-77.2 kg] 77.2 kg (03/12 0353)  Weight change:  Filed Weights   08/24/18 0959 08/24/18 1650 08/25/18 0353  Weight: 77 kg 77.1 kg 77.2 kg    Intake/Output:    Intake/Output Summary (Last 24 hours) at 08/25/2018 1511 Last data filed at 08/24/2018 2146 Gross per 24 hour  Intake 0 ml  Output 1 ml  Net -1 ml     Physical Exam: General:  No acute distress, lying in the bed  HEENT  anicteric, moist oral mucous membranes  Neck  supple, no masses  Pulm/lungs  normal breathing effort, O2 by [place] supplementation  CVS/Heart  regular, no rub  Abdomen:   Soft, non-Tender  Extremities:   no peripheral edema  Neurologic:  Alert, oriented  Skin:  No acute rashes  Access:  Left arm AV fistula       Basic Metabolic Panel:  Recent Labs  Lab 08/24/18 1005  NA 135  K 3.0*  CL 101  CO2 25  GLUCOSE 81  BUN 10  CREATININE 3.56*  CALCIUM 7.6*     CBC: Recent Labs  Lab 08/24/18 1005  WBC 3.4*  HGB 10.5*  HCT 33.4*  MCV 100.6*  PLT 138*      Lab Results  Component Value Date   HEPBSAG Negative 08/08/2018      Microbiology:  No results found for this or any previous visit (from the past 240 hour(s)).  Coagulation Studies: Recent Labs     08/24/18 1005  LABPROT 18.1*  INR 1.5*    Urinalysis: No results for input(s): COLORURINE, LABSPEC, PHURINE, GLUCOSEU, HGBUR, BILIRUBINUR, KETONESUR, PROTEINUR, UROBILINOGEN, NITRITE, LEUKOCYTESUR in the last 72 hours.  Invalid input(s): APPERANCEUR    Imaging: Dg Chest 2 View  Result Date: 08/24/2018 CLINICAL DATA:  Chest pain EXAM: CHEST - 2 VIEW COMPARISON:  08/05/2018 FINDINGS: Cardiomegaly. Diffuse interstitial coarsening with a few Kerley lines. No pleural effusion. CABG and extensive coronary atherosclerotic calcification and/or stenting. IMPRESSION: Mild interstitial edema. Electronically Signed   By: Monte Fantasia M.D.   On: 08/24/2018 11:00     Medications:    . apixaban  5 mg Oral BID  . aspirin  81 mg Oral Daily  . cinacalcet  60 mg Oral QPM  . digoxin  0.0625 mg Oral Daily  . levothyroxine  88 mcg Oral Q0600  . pantoprazole  40 mg Oral Daily  . ranolazine  500 mg Oral BID  . sacubitril-valsartan  1 tablet Oral BID  . sevelamer carbonate  3,200 mg Oral TID WC  . sucralfate  1 g Oral BID   acetaminophen, albuterol, hydrOXYzine, ondansetron (ZOFRAN) IV  Assessment/ Plan:  57 y.o. female withESRD on HD MWF, hypertension, congestive heart failure, anemia of  CKD, SHPTH, myocardial infarction, hx of CVA, prior episodes of hyperkalemia,pulmonary embolism8/2019,ascites, nodular hepatic contour noted on CT December 2019  [facility]/ [facility]/MWF/EDW 77 kg  1.  Chest pain 2. End-stage renal disease 3. Anemia of chronic kidney disease 4. Secondary hyperparathyroidism 5. Ascites?Cirrhosis of the liver 6. Cardiac. 2D echo from May 2016. LVEF 45 to 50%. Moderate tricuspid regurgitation  Electrolytes and volume status are acceptable.  No acute indication for dialysis at present.  We will plan to dialyze patient tomorrow if she is still in the hospital.  Continue midodrine to manage outpatient hypotension   LOS: 0 Miu Chiong 3/12/20203:11  PM  [place], [place]  Note: This note was prepared with Dragon dictation. Any transcription errors are unintentional

## 2018-08-25 NOTE — Care Management Obs Status (Signed)
[place] NOTIFICATION   Patient Details  Name: [name] MRN: [id number] Date of Birth: [birth year]   Medicare Observation Status Notification Given:  Yes    Elza Rafter, RN 08/25/2018, 2:19 PM

## 2018-08-25 NOTE — TOC Transition Note (Signed)
Transition of Care [facility]) - CM/SW Discharge Note   Patient Details  Name: [name] MRN: [id number] Date of Birth: 13-Feb-1961  [facility] CM/SW Contact:  Elza Rafter, RN Phone Number: 08/25/2018, 1:36 PM   Clinical Narrative:   Discharging to home today.  No needs identified.    Final next level of care: Home/Self Care Barriers to Discharge: No Barriers Identified   Patient Goals and CMS Choice        Discharge Placement                       Discharge Plan and Services Discharge Planning Services: CM Consult                HH Arranged: Refused HH     Social Determinants of Health (SDOH) Interventions     Readmission Risk Interventions No flowsheet data found.

## 2018-08-25 NOTE — Plan of Care (Signed)
  Problem: Activity: Goal: Capacity to carry out activities will improve Outcome: Progressing Note:  Up to bathroom with standby assist, tolerating well   Problem: Nutrition: Goal: Adequate nutrition will be maintained Outcome: Progressing   Problem: Coping: Goal: Level of anxiety will decrease Outcome: Progressing   Problem: Elimination: Goal: Will not experience complications related to bowel motility Outcome: Progressing Note:  BM this shift   Problem: Pain Managment: Goal: General experience of comfort will improve Outcome: Progressing Note:  Treated for headache and some chest pressure once with tylenol   Problem: Safety: Goal: Ability to remain free from injury will improve Outcome: Progressing   Problem: Elimination: Goal: Will not experience complications related to urinary retention Outcome: Not Applicable Note:  Pt does not make urine - anuric due to hemodialysis

## 2018-08-25 NOTE — Plan of Care (Signed)
Pt ready for discharge home.  Problem: Education: Goal: Ability to demonstrate management of disease process will improve Outcome: Completed/Met Goal: Ability to verbalize understanding of medication therapies will improve Outcome: Completed/Met Goal: Individualized Educational Video(s) Outcome: Completed/Met   Problem: Activity: Goal: Capacity to carry out activities will improve Outcome: Completed/Met   Problem: Cardiac: Goal: Ability to achieve and maintain adequate cardiopulmonary perfusion will improve Outcome: Completed/Met   Problem: Education: Goal: Knowledge of General Education information will improve Description Including pain rating scale, medication(s)/side effects and non-pharmacologic comfort measures Outcome: Completed/Met   Problem: Health Behavior/Discharge Planning: Goal: Ability to manage health-related needs will improve Outcome: Completed/Met   Problem: Clinical Measurements: Goal: Ability to maintain clinical measurements within normal limits will improve Outcome: Completed/Met Goal: Will remain free from infection Outcome: Completed/Met Goal: Diagnostic test results will improve Outcome: Completed/Met Goal: Respiratory complications will improve Outcome: Completed/Met Goal: Cardiovascular complication will be avoided Outcome: Completed/Met   Problem: Activity: Goal: Risk for activity intolerance will decrease Outcome: Completed/Met   Problem: Nutrition: Goal: Adequate nutrition will be maintained Outcome: Completed/Met   Problem: Coping: Goal: Level of anxiety will decrease Outcome: Completed/Met   Problem: Elimination: Goal: Will not experience complications related to bowel motility Outcome: Completed/Met   Problem: Pain Managment: Goal: General experience of comfort will improve Outcome: Completed/Met   Problem: Safety: Goal: Ability to remain free from injury will improve Outcome: Completed/Met   Problem: Skin  Integrity: Goal: Risk for impaired skin integrity will decrease Outcome: Completed/Met

## 2018-08-25 NOTE — TOC Initial Note (Addendum)
Transition of Care [facility]) - Initial/Assessment Note    Patient Details  Name: [name] MRN: [id number] Date of Birth: 1960-11-13  Transition of Care [facility]) CM/SW Contact:    Elza Rafter, RN Phone Number: 08/25/2018, 1:29 PM  Clinical Narrative:       Patient is from home with daughter.  She is an HD patient MWF at [facility] on [address]  She was placed in observation for CP.  Discharging to home today.  Presented MOON letter.  Uses oxygen at home 2L.  Currently on 2L.  Current with PCP.  Obtains medications without difficulty.  She has a high readmission rate.  Offered HH services; patient declines.  Complaint with medications.  Does not miss HD.  Will make arrangements for transportation at [place].  No further needs identified at this time.              Expected Discharge Plan: Home/Self Care Barriers to Discharge: No Barriers Identified   Patient Goals and CMS Choice        Expected Discharge Plan and Services Expected Discharge Plan: Home/Self Care Discharge Planning Services: CM Consult   Living arrangements for the past 2 months: Apartment Expected Discharge Date: 08/25/18                   HH Arranged: Refused HH    Prior Living Arrangements/Services Living arrangements for the past 2 months: Apartment Lives with:: Adult Children Patient language and need for interpreter reviewed:: Yes Do you feel safe going back to the place where you live?: Yes        Care giver support system in place?: Yes (comment) Current home services: DME(O2 through [place])    Activities of Daily Living Home Assistive Devices/Equipment: Oxygen, Blood pressure cuff ADL Screening (condition at time of admission) Patient's cognitive ability adequate to safely complete daily activities?: Yes Is the patient deaf or have difficulty hearing?: No Does the patient have difficulty seeing, even when wearing glasses/contacts?: No Does the patient have difficulty concentrating, remembering,  or making decisions?: No Patient able to express need for assistance with ADLs?: Yes Does the patient have difficulty dressing or bathing?: No Independently performs ADLs?: Yes (appropriate for developmental age) Does the patient have difficulty walking or climbing stairs?: No Weakness of Legs: Left Weakness of Arms/Hands: None  Permission Sought/Granted                  Emotional Assessment Appearance:: Appears stated age, Well-Groomed Attitude/Demeanor/Rapport: Gracious Affect (typically observed): Accepting Orientation: : Oriented to Self, Oriented to Place, Oriented to  Time, Oriented to Situation Alcohol / Substance Use: Not Applicable    Admission diagnosis:  Other chest pain [R07.89] Elevated troponin [R79.89] Patient Active Problem List   Diagnosis Date Noted  . Non-STEMI (non-ST elevated myocardial infarction) ([place]) 08/07/2018  . Pneumonia 01/31/2018  . Swelling of limb 09/22/2016  . Kidney dialysis as the cause of abnormal reaction of the patient, or of later complication, without mention of misadventure at the time of the procedure (CODE) 03/24/2016  . Hyperkalemia 11/13/2014  . Generalized weakness 10/15/2014  . Chest pain 10/15/2014  . ESRD on hemodialysis ([place]) 10/15/2014  . Congestive heart failure (CHF) ([place]) 10/15/2014  . HTN (hypertension) 10/15/2014  . Sternal wound dehiscence 04/12/2014  . 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 Pericardial effusion 04/12/2014  . History of delirium 04/12/2014  . H/O atrial flutter 04/12/2014  . Delayed surgical wound healing 04/12/2014  . Chest wall pain  following surgery 04/12/2014  . S/P CABG (coronary artery bypass graft) 04/02/2014  . Lactic acidosis 03/26/2014  . Arteriosclerotic dementia ([place]) 03/25/2014  . Atrial flutter by electrocardiogram ([place]) 03/22/2014  . Superficial incisional surgical site infection 03/14/2014  . Postoperative anemia due to acute blood loss 02/27/2014  . Obesity 02/27/2014  .  H/O: substance abuse ([place]) 02/27/2014  . Anemia of chronic renal failure 02/27/2014  . Acute postoperative pain 02/27/2014  . Leukocytosis 02/26/2014  . Hypotension 02/24/2014  . Exhausted vascular access 02/24/2014  . Anemia of chronic disease 02/24/2014  . Stroke ([place]) 02/21/2014  . S/P CABG x 3 02/21/2014  . Dialysis patient ([place]) 02/21/2014  . Hyperlipidemia 02/13/2014  . History of hepatitis C 02/13/2014  . HFrEF (heart failure with reduced ejection fraction) ([place]) 02/13/2014  . H/O stroke without residual deficits 02/13/2014  . ESRD (end stage renal disease) on dialysis ([place]) 02/13/2014  . End stage renal disease with hypertension ([place]) 02/13/2014  . CAD, multiple vessel 02/13/2014  . Retrosternal chest pain 05/30/2013  . Gallstones 05/29/2013   PCP:  [name], MD Pharmacy:   [facility] [address]), [place] [address] [place] [place] [address] Phone: [phone] Fax: [phone]     Social Determinants of Health (SDOH) Interventions    Readmission Risk Interventions 30 Day Unplanned Readmission Risk Score     ED to Hosp-Admission (Discharged) from 08/05/2018 in [facility] (2A)  30 Day Unplanned Readmission Risk Score (%)  37 Filed at 08/09/2018 1200     This score is the patient's risk of an unplanned readmission within 30 days of being discharged (0 -100%). The score is based on dignosis, age, lab data, medications, orders, and past utilization.   Low:  0-14.9   Medium: 15-21.9   High: 22-29.9   Extreme: 30 and above       No flowsheet data found.

## 2018-08-26 NOTE — Discharge Summary (Signed)
[name] NAME: [name]    MR#:  [id number]  DATE OF BIRTH:  Nov 05, 1960  DATE OF ADMISSION:  08/24/2018   ADMITTING PHYSICIAN: Vanessa Leber, MD  DATE OF DISCHARGE: 08/25/2018  3:11 PM  PRIMARY CARE PHYSICIAN: Vanessa Maltese, MD   ADMISSION DIAGNOSIS:   Other chest pain [R07.89] Elevated troponin [R79.89]  DISCHARGE DIAGNOSIS:   Active Problems:   Chest pain   SECONDARY DIAGNOSIS:   Past Medical History:  Diagnosis Date   CHF (congestive heart failure) (HCC)    Heart failure (HCC)    Hyperlipidemia    Hypertension    Myocardial infarction ([place])    Paroxysmal atrial fibrillation (HCC)    Renal failure    Renal insufficiency    Stroke [facility]) 2011    HOSPITAL COURSE:   58 year old female with past medical history significant for paroxysmal atrial fibrillation on Eliquis, hypertension, prior history of stroke, end-stage renal disease on hemodialysis Monday, Wednesday and Friday was sent in from dialysis center secondary to chest pain and hypertension.  1.  Hypotension-patient has chronic hypotension.  She has been off of all her blood pressure medications and heart failure medications due to the same.  She is on midodrine prior to dialysis.  She is not symptomatic with her low blood pressures.  No orthostatic blood pressure changes.  Continue midodrine prior to dialysis.  2.  Chest pain-patient has chronic chest pain.  She complains of a 10 on 10 chest pain but very pleasant and interacting and not appears to be in distress.  All she wanted was few pills of Percocet to be discharged home. -She had a cardiac cath on 08/08/2018 showing three-vessel disease status post bypass graft.  Medical management recommended but however due to her chronically low blood pressures, she is not on any Imdur.  Ranexa being prescribed at discharge. -Continue outpatient follow-up with cardiology.  3.  Paroxysmal A.  fib-rate controlled.  Patient on digoxin.  Also on Eliquis for anticoagulation.  4.  GERD-Protonix  5.  End-stage renal disease on hemodialysis.  Appreciate nephrology input.  Continue outpatient dialysis follow-up  Patient is up and ambulatory.  Will be discharged home today  DISCHARGE CONDITIONS:   Guarded  CONSULTS OBTAINED:   Treatment Team:  Vanessa David, MD Vanessa Iba, MD  DRUG ALLERGIES:   Allergies  Allergen Reactions   Morphine And Related Hives   Levaquin [Levofloxacin] Itching    Severe itching; prickly sensation   Latex Rash   Tape Rash   DISCHARGE MEDICATIONS:   Allergies as of 08/25/2018      Reactions   Morphine And Related Hives   Levaquin [levofloxacin] Itching   Severe itching; prickly sensation   Latex Rash   Tape Rash      Medication List    STOP taking these medications   carvedilol 25 MG tablet Commonly known as:  COREG   Entresto 49-51 MG Generic drug:  sacubitril-valsartan   isosorbide mononitrate 30 MG 24 hr tablet Commonly known as:  IMDUR     TAKE these medications   albuterol 108 (90 Base) MCG/ACT inhaler Commonly known as:  PROVENTIL HFA;VENTOLIN HFA Inhale 2 puffs into the lungs every 4 (four) hours as needed for wheezing or shortness of breath.   aspirin 81 MG chewable tablet Chew 81 mg by mouth daily.   digoxin 0.125 MG tablet Commonly known as:  LANOXIN Take 0.0625 mg by mouth daily.  Eliquis 5 MG Tabs tablet Generic drug:  apixaban Take 1 tablet (5 mg total) by mouth 2 (two) times daily.   hydrOXYzine 50 MG tablet Commonly known as:  ATARAX/VISTARIL Take 50 mg by mouth 2 (two) times daily as needed for itching.   levothyroxine 88 MCG tablet Commonly known as:  SYNTHROID, LEVOTHROID Take 88 mcg by mouth at bedtime.   midodrine 10 MG tablet Commonly known as:  PROAMATINE Take 10-20 mg by mouth daily. (for low BP associated with dialysis)   oxyCODONE-acetaminophen 5-325 MG tablet Commonly known  as:  Percocet Take 1 tablet by mouth every 8 (eight) hours as needed for up to 10 days for severe pain.   pantoprazole 40 MG tablet Commonly known as:  PROTONIX Take 40 mg by mouth daily.   ranolazine 500 MG 12 hr tablet Commonly known as:  RANEXA Take 1 tablet (500 mg total) by mouth 2 (two) times daily.   Sensipar 60 MG tablet Generic drug:  cinacalcet Take 60 mg by mouth every evening.   sevelamer carbonate 800 MG tablet Commonly known as:  RENVELA Take 4 tablets (3200MG ) by mouth 3 times daily with meals and 2 tablets (1600MG ) by mouth twice daily with snacks   sucralfate 1 g tablet Commonly known as:  CARAFATE Take 1 g by mouth 2 (two) times daily.        DISCHARGE INSTRUCTIONS:   1.  PCP follow-up in 1 to 2 weeks 2.  Follow-up with nephrology for dialysis as needed  DIET:   Renal diet  ACTIVITY:   Activity as tolerated  OXYGEN:   Home Oxygen: No.  Oxygen Delivery: room air  DISCHARGE LOCATION:   home   If you experience worsening of your admission symptoms, develop shortness of breath, life threatening emergency, suicidal or homicidal thoughts you must seek medical attention immediately by calling 911 or calling your MD immediately  if symptoms less severe.  You Must read complete instructions/literature along with all the possible adverse reactions/side effects for all the Medicines you take and that have been prescribed to you. Take any new Medicines after you have completely understood and accpet all the possible adverse reactions/side effects.   Please note  You were cared for by a hospitalist during your hospital stay. If you have any questions about your discharge medications or the care you received while you were in the hospital after you are discharged, you can call the unit and asked to speak with the hospitalist on call if the hospitalist that took care of you is not available. Once you are discharged, your primary care physician will handle any  further medical issues. Please note that NO REFILLS for any discharge medications will be authorized once you are discharged, as it is imperative that you return to your primary care physician (or establish a relationship with a primary care physician if you do not have one) for your aftercare needs so that they can reassess your need for medications and monitor your lab values.    On the day of Discharge:  VITAL SIGNS:   Blood pressure (!) 103/53, pulse 60, temperature 98.3 F (36.8 C), resp. rate 16, height 5\' 6"  (1.676 m), weight 77.2 kg, SpO2 99 %.  PHYSICAL EXAMINATION:    GENERAL:  58 y.o.-year-old patient lying in the bed with no acute distress.  EYES: Pupils equal, round, reactive to light and accommodation. No scleral icterus. Extraocular muscles intact.  HEENT: Head atraumatic, normocephalic. Oropharynx and nasopharynx clear.  NECK:  Supple, no jugular venous distention. No thyroid enlargement, no tenderness.  LUNGS: Normal breath sounds bilaterally, no wheezing, rales,rhonchi or crepitation. No use of accessory muscles of respiration.  Decreased bibasilar breath sounds CARDIOVASCULAR: S1, S2 normal. No murmurs, rubs, or gallops.  ABDOMEN: Soft, non-tender, non-distended. Bowel sounds present. No organomegaly or mass.  EXTREMITIES: No pedal edema, cyanosis, or clubbing. Left arm AV fistula NEUROLOGIC: Cranial nerves II through XII are intact. Muscle strength 5/5 in all extremities. Sensation intact. Gait not checked.  PSYCHIATRIC: The patient is alert and oriented x 3.  SKIN: No obvious rash, lesion, or ulcer.   DATA REVIEW:   CBC Recent Labs  Lab 08/24/18 1005  WBC 3.4*  HGB 10.5*  HCT 33.4*  PLT 138*    Chemistries  Recent Labs  Lab 08/24/18 1005  NA 135  K 3.0*  CL 101  CO2 25  GLUCOSE 81  BUN 10  CREATININE 3.56*  CALCIUM 7.6*     Microbiology Results  Results for orders placed or performed during the hospital encounter of 08/05/18  MRSA PCR  Screening     Status: Abnormal   Collection Time: 08/06/18  4:29 AM  Result Value Ref Range Status   MRSA by PCR POSITIVE (A) NEGATIVE Final    Comment:        The GeneXpert MRSA Assay (FDA approved for NASAL specimens only), is one component of a comprehensive MRSA colonization surveillance program. It is not intended to diagnose MRSA infection nor to guide or monitor treatment for MRSA infections. RESULT CALLED TO, READ BACK BY AND VERIFIED WITH: KAT MURRAY 08/06/18 @ 3329  [place] Performed at [facility], [address] [place], [place] [address]     RADIOLOGY:  No results found.   Management plans discussed with the patient, family and they are in agreement.  CODE STATUS:  Code Status History    Date Active Date Inactive Code Status Order ID Comments User Context   08/24/2018 1639 08/25/2018 1816 Full Code [id number]  Vanessa Leber, MD Inpatient   08/06/2018 0344 08/09/2018 1639 Full Code [id number]  Harrie Foreman, MD Inpatient   06/02/2018 0039 06/04/2018 2144 Full Code [id number]  Arta Silence, MD Inpatient   04/21/2018 1808 04/22/2018 2210 Full Code [id number]  Dustin Flock, MD Inpatient   01/31/2018 1924 02/03/2018 0111 Full Code [id number]  Loletha Grayer, MD ED   11/13/2014 1659 11/15/2014 2152 Full Code [id number]  Hillary Bow, MD ED   10/16/2014 0823 10/17/2014 2333 Full Code [id number]  Lance Coon, MD Inpatient      TOTAL TIME TAKING CARE OF THIS PATIENT: 38 minutes.    Gladstone Lighter M.D on 08/26/2018 at 2:25 PM  Between 7am to 6pm - Pager - [phone]  After 6pm go to [email or web address] - Proofreader  [facility]  Office  [phone]  CC: Primary care physician; Vanessa Maltese, MD   Note: This dictation was prepared with Dragon dictation along with smaller phrase technology. Any transcriptional errors that result from this process are unintentional.

## 2018-08-27 IMAGING — US US ABDOMEN LIMITED
1 series · 14 of 25 positions shown · non-contrast
Comparison: Right upper quadrant ultrasound January 13, 2016

CLINICAL DATA: Nausea, fatigue, itching, for several months.
Elevated alkaline phosphatase.

EXAM:
ULTRASOUND ABDOMEN LIMITED RIGHT UPPER QUADRANT

[Series 1: us abdomen limited · 0.22mm/px · 14 of 63 slices shown]
[im 1/63]
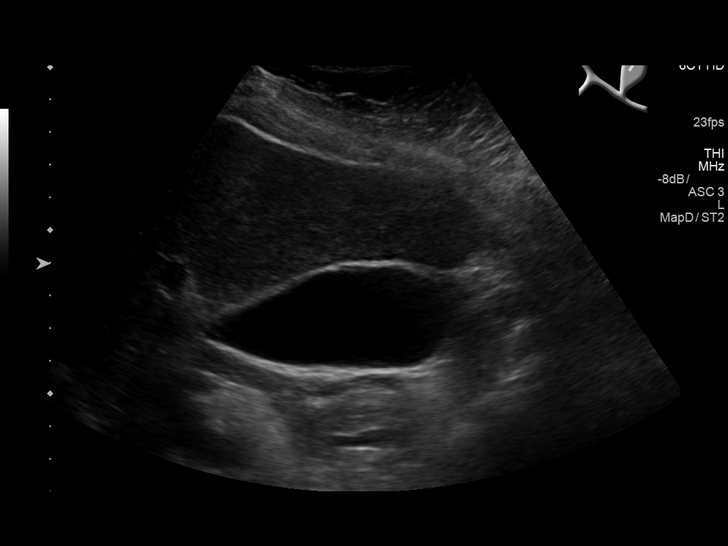
[im 6/63]
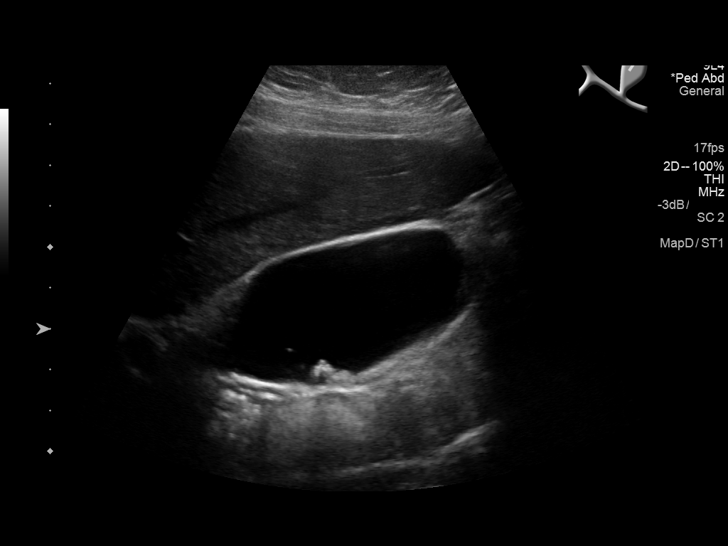
[im 11/63]
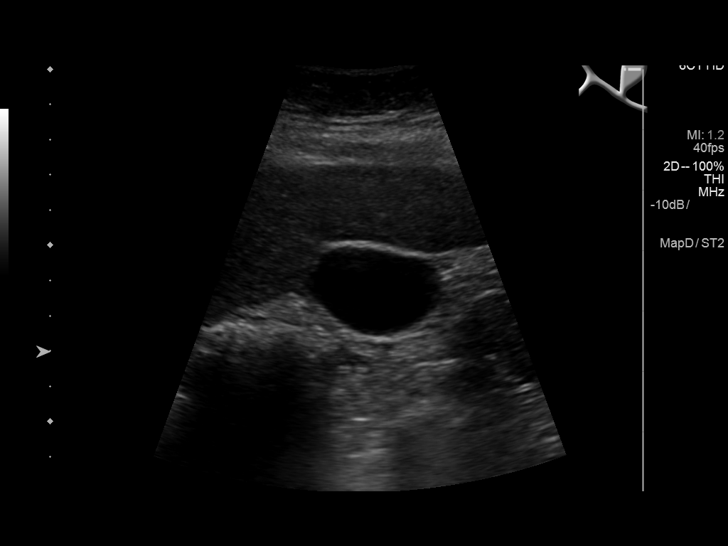
[im 16/63]
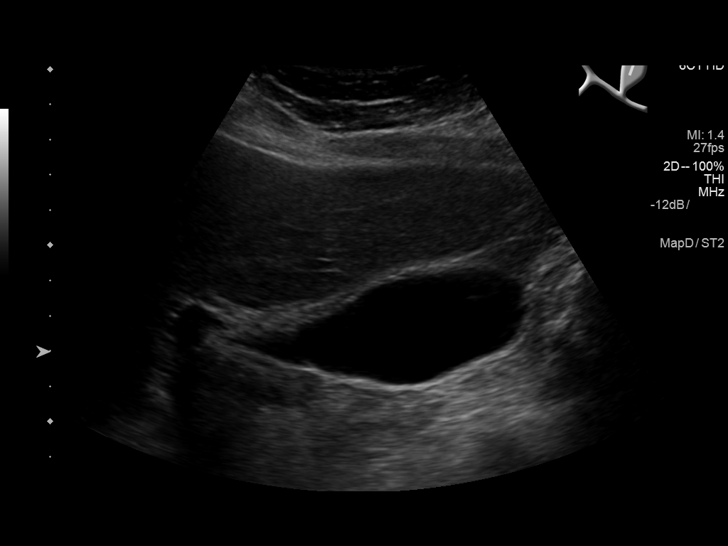
[im 21/63]
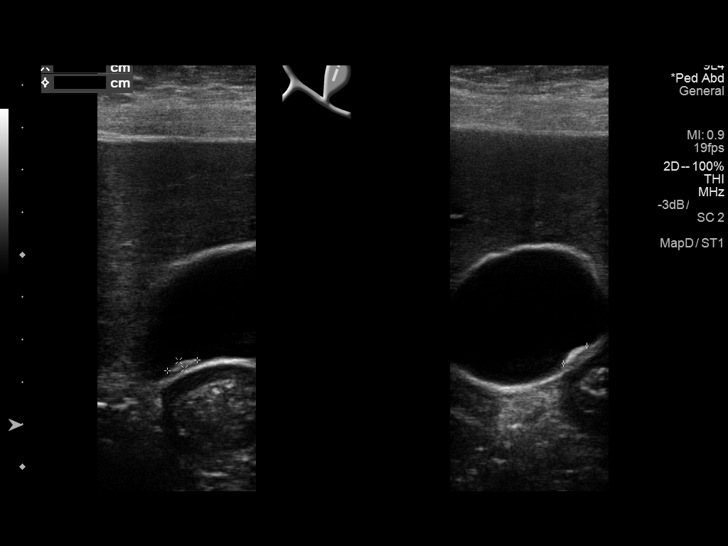
[im 24/63]
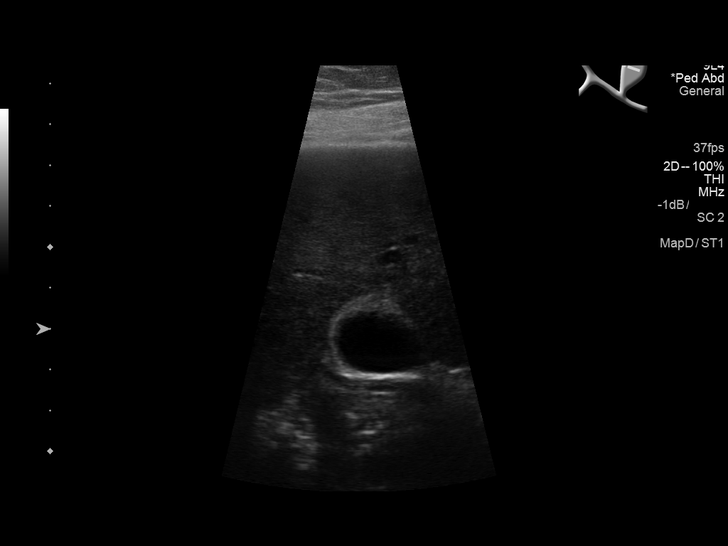
[im 29/63]
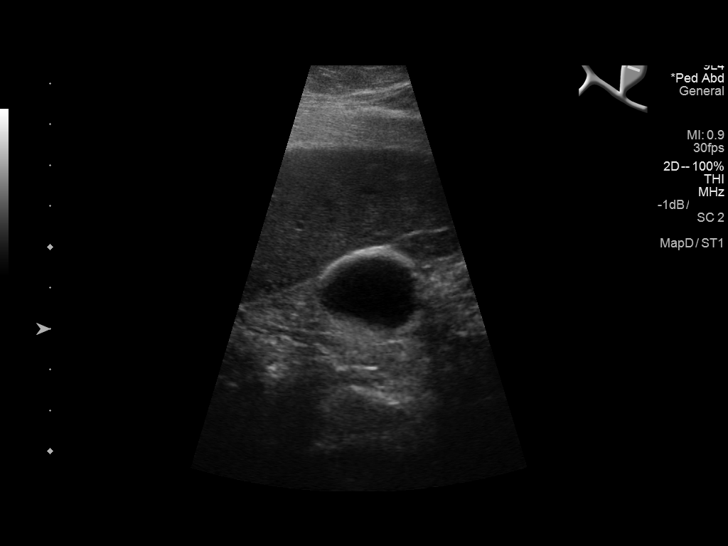
[im 34/63]
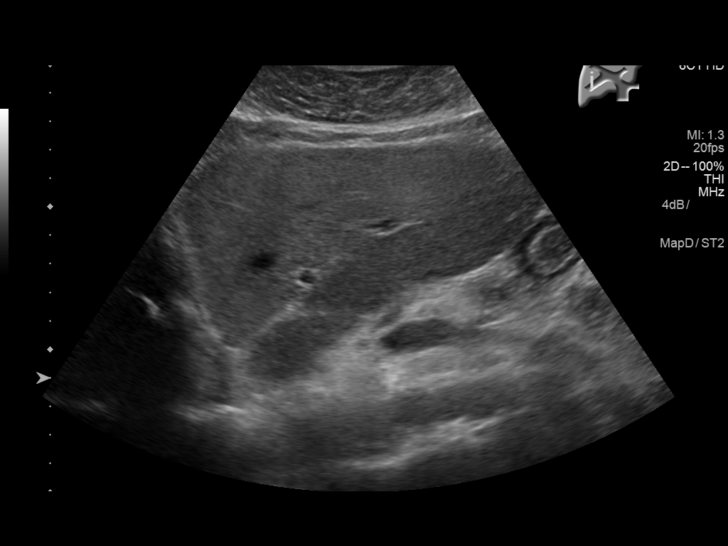
[im 39/63]
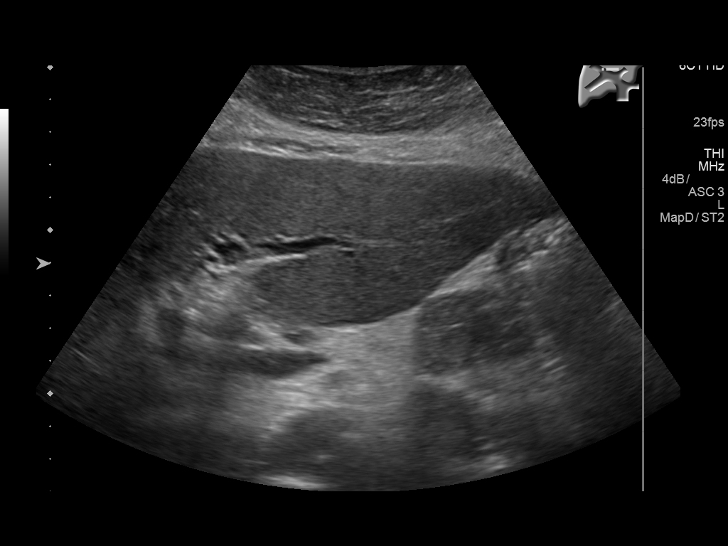
[im 42/63]
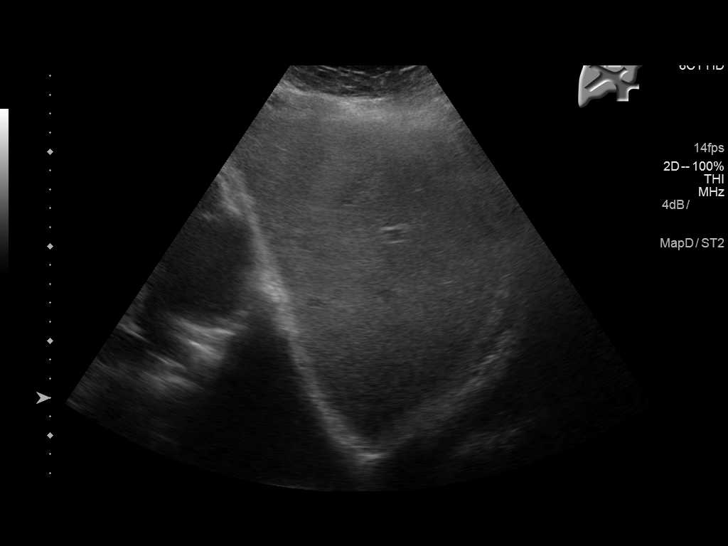
[im 47/63]
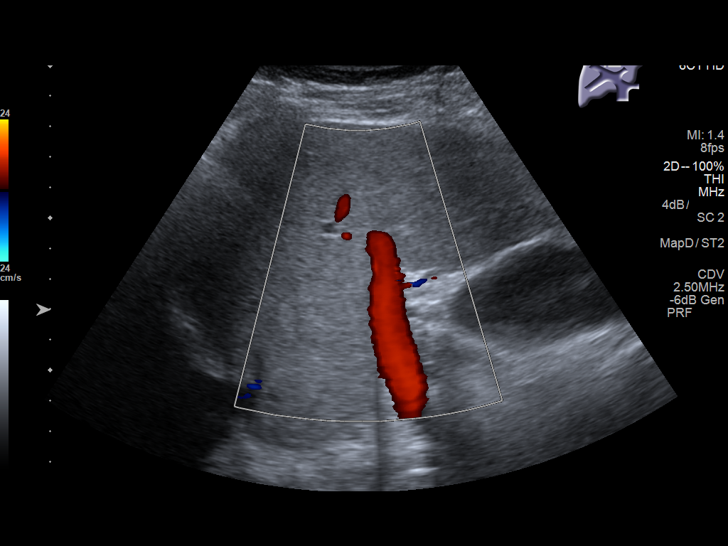
[im 52/63]
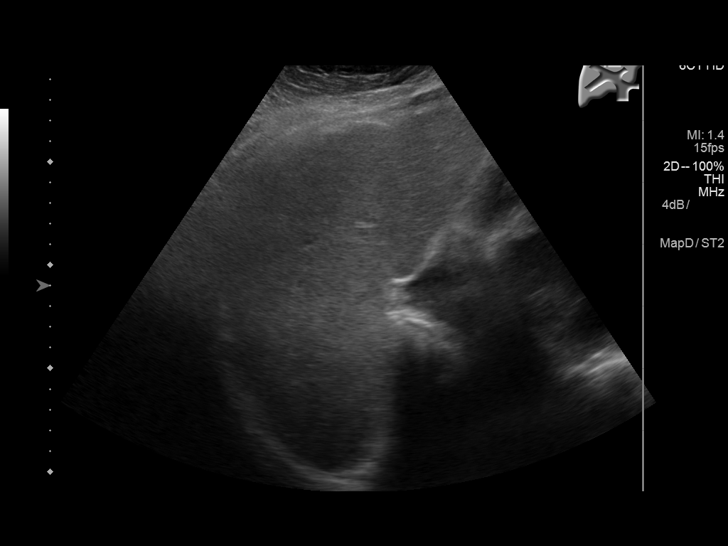
[im 57/63]
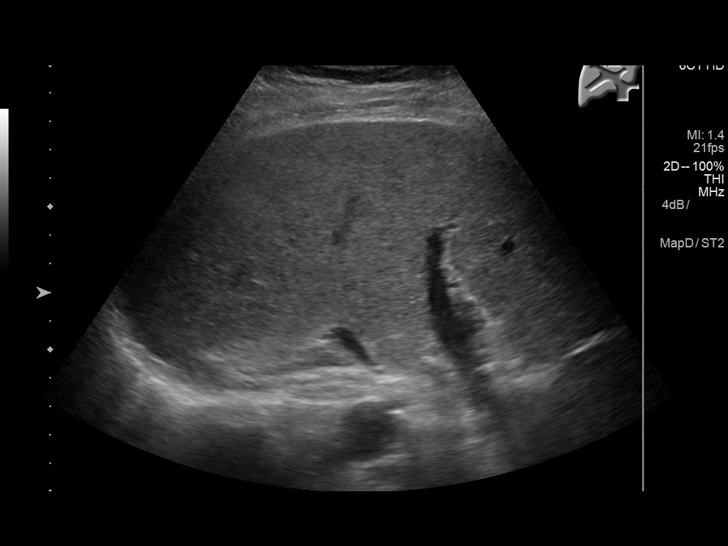
[im 63/63]
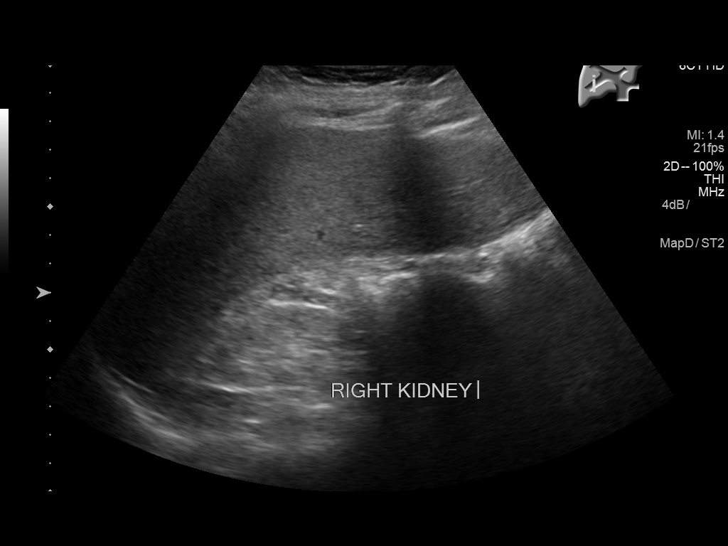

[14 of 25 positions shown; findings below may reference images not displayed]

FINDINGS: Gallbladder:

The gallbladder is adequately distended. There is no gallbladder
wall thickening, pericholecystic fluid, or positive sonographic
Murphy's sign. There is an echogenic shadowing stone measuring
mm in diameter. There is an approximately 7 x 2.3 x 6.9 mm polyp.

Common bile duct:

Diameter: 2.7 mm

Liver:

The hepatic echotexture is normal. The surface contour is mildly
irregular. There is no focal mass or ductal dilation. Portal vein is
patent on color Doppler imaging with normal direction of blood flow
towards the liver.
IMPRESSION: Gallstones and polyps. No sonographic evidence of acute
cholecystitis.

Mild surface contour irregularity of the liver noted on several
images. No discrete masses.

## 2018-09-15 ENCOUNTER — Ambulatory Visit (INDEPENDENT_AMBULATORY_CARE_PROVIDER_SITE_OTHER): Payer: Medicare Other | Admitting: Nurse Practitioner

## 2018-09-15 ENCOUNTER — Encounter (INDEPENDENT_AMBULATORY_CARE_PROVIDER_SITE_OTHER): Payer: Medicare Other

## 2018-10-11 ENCOUNTER — Other Ambulatory Visit: Payer: Self-pay | Admitting: Gastroenterology

## 2018-10-11 DIAGNOSIS — R197 Diarrhea, unspecified: Secondary | ICD-10-CM

## 2018-10-20 ENCOUNTER — Other Ambulatory Visit: Payer: Self-pay

## 2018-10-20 ENCOUNTER — Ambulatory Visit
Admission: RE | Admit: 2018-10-20 | Discharge: 2018-10-20 | Disposition: A | Discharge status: Home / Self Care | Payer: Medicare Other | Source: Ambulatory Visit | Attending: Gastroenterology | Admitting: Gastroenterology

## 2018-10-20 DIAGNOSIS — R197 Diarrhea, unspecified: Secondary | ICD-10-CM | POA: Insufficient documentation

## 2018-11-02 ENCOUNTER — Other Ambulatory Visit: Payer: Self-pay | Admitting: Gastroenterology

## 2018-11-02 DIAGNOSIS — R195 Other fecal abnormalities: Secondary | ICD-10-CM

## 2018-11-02 DIAGNOSIS — K639 Disease of intestine, unspecified: Secondary | ICD-10-CM

## 2018-11-08 ENCOUNTER — Encounter: Admission: RE | Payer: Self-pay | Source: Home / Self Care

## 2018-11-08 ENCOUNTER — Ambulatory Visit: Admission: RE | Admit: 2018-11-08 | Payer: Medicare Other | Source: Home / Self Care | Admitting: Gastroenterology

## 2018-11-08 SURGERY — EGD (ESOPHAGOGASTRODUODENOSCOPY)
Anesthesia: General

## 2018-11-24 ENCOUNTER — Other Ambulatory Visit: Payer: Self-pay | Admitting: Family

## 2018-11-24 DIAGNOSIS — N644 Mastodynia: Secondary | ICD-10-CM

## 2018-11-29 ENCOUNTER — Ambulatory Visit
Admission: RE | Admit: 2018-11-29 | Discharge: 2018-11-29 | Disposition: A | Discharge status: Home / Self Care | Payer: Medicare Other | Source: Ambulatory Visit | Attending: Family | Admitting: Family

## 2018-11-29 ENCOUNTER — Other Ambulatory Visit: Payer: Self-pay

## 2018-11-29 DIAGNOSIS — N644 Mastodynia: Secondary | ICD-10-CM

## 2018-12-09 ENCOUNTER — Other Ambulatory Visit: Payer: Self-pay | Admitting: Family

## 2018-12-09 DIAGNOSIS — N644 Mastodynia: Secondary | ICD-10-CM

## 2018-12-20 ENCOUNTER — Ambulatory Visit
Admission: RE | Admit: 2018-12-20 | Discharge: 2018-12-20 | Disposition: A | Discharge status: Home / Self Care | Payer: Medicare Other | Source: Ambulatory Visit | Attending: Gastroenterology | Admitting: Gastroenterology

## 2018-12-20 DIAGNOSIS — R195 Other fecal abnormalities: Secondary | ICD-10-CM

## 2018-12-20 DIAGNOSIS — K639 Disease of intestine, unspecified: Secondary | ICD-10-CM

## 2018-12-22 ENCOUNTER — Ambulatory Visit (INDEPENDENT_AMBULATORY_CARE_PROVIDER_SITE_OTHER): Payer: Medicare Other | Admitting: Obstetrics and Gynecology

## 2018-12-22 ENCOUNTER — Other Ambulatory Visit: Payer: Self-pay

## 2018-12-22 ENCOUNTER — Encounter: Payer: Self-pay | Admitting: Obstetrics and Gynecology

## 2018-12-22 VITALS — BP 120/72 | Ht 66.0 in | Wt 157.0 lb

## 2018-12-22 DIAGNOSIS — N644 Mastodynia: Secondary | ICD-10-CM

## 2018-12-22 MED ORDER — DICLOFENAC SODIUM 1 % TD GEL: 4.0000 g | Freq: Four times a day (QID) | TRANSDERMAL | 3 refills | Status: DC

## 2018-12-22 NOTE — Progress Notes (Signed)
Patient ID: [name], female   DOB: 1961-03-09, 58 y.o.   MRN: [id number]  Reason for Consult: Referral (C/o severe pain under left breast x1 month )   Referred by [name], MD  Subjective:     HPI:  [name] is a 58 y.o. female. She reports 2 months of sharp shooting pain in her left breast. Sh has the pain daily. The pain is constant throughout the day with short bursts of sharp pain frequently multiple times an hour. She had a recent mammogram which was normal. She reports the pain shoots from the top of her breast down, sometimes from the side along the bottom of the breast. She has tried heat and ice which do not help. She take hydrocodone with some relief. She does not usually wear a bra, bras make it worse. She Denies skin changes, denies lumps, denies nipple drainage.   She has a significant cardiovascular history. She follows with Dr. Humphrey Rolls. Reports her last visit was 1 month ago with him. She has She states this pain is very different from pain related to heart disease of which she says she has had enough to know the difference.   She is postmenopausal. Denies vaginal bleeding. Few hot flashes. Not sexually active. Unsure of LMP, estimates in her 87s, but is unsure.   Past Medical History:  Diagnosis Date  . CHF (congestive heart failure) ([place])   . Heart failure ([place])   . Hyperlipidemia   . Hypertension   . Myocardial infarction ([place])   . Paroxysmal atrial fibrillation (HCC)   . Renal failure   . Renal insufficiency   . Stroke [facility]) 2011   Family History  Problem Relation Age of Onset  . Hypertension Other   . Cancer Other   . Renal Disease Other   . Ovarian cancer Mother   . Kidney disease Father   . Breast cancer Maternal Aunt   . Breast cancer Maternal Grandmother    Past Surgical History:  Procedure Laterality Date  . CORONARY ANGIOPLASTY WITH STENT PLACEMENT  2013  . CORONARY ARTERY BYPASS GRAFT    . DIALYSIS FISTULA CREATION    . LEFT  HEART CATH AND CORS/GRAFTS ANGIOGRAPHY N/A 08/08/2018   Procedure: LEFT HEART CATH AND CORS/GRAFTS ANGIOGRAPHY;  Surgeon: Dionisio David, MD;  Location: [facility];  Service: Cardiovascular;  Laterality: N/A;    Short Social History:  Social History   Tobacco Use  . Smoking status: Current Some Day Smoker    Packs/day: 0.10    Years: 20.00    Pack years: 2.00    Types: Cigarettes  . Smokeless tobacco: Never Used  Substance Use Topics  . Alcohol use: No    Allergies  Allergen Reactions  . Morphine And Related Hives  . Levaquin [Levofloxacin] Itching    Severe itching; prickly sensation  . Latex Rash  . Tape Rash    Current Outpatient Medications  Medication Sig Dispense Refill  . aspirin 81 MG chewable tablet Chew 81 mg by mouth daily.    . cinacalcet (SENSIPAR) 60 MG tablet Take 60 mg by mouth every evening.     . digoxin (LANOXIN) 0.125 MG tablet Take 0.0625 mg by mouth daily.    Marland Kitchen ELIQUIS 5 MG TABS tablet Take 1 tablet (5 mg total) by mouth 2 (two) times daily. 60 tablet 0  . hydrOXYzine (ATARAX/VISTARIL) 50 MG tablet Take 50 mg by mouth 2 (two) times daily as needed for  itching.     . isosorbide mononitrate (IMDUR) 60 MG 24 hr tablet Take by mouth.    . levothyroxine (SYNTHROID) 75 MCG tablet TAKE 1 TABLET BY MOUTH ONCE DAILY IN THE MORNING    . midodrine (PROAMATINE) 10 MG tablet Take 10-20 mg by mouth daily. (for low BP associated with dialysis)    . oxyCODONE-acetaminophen (PERCOCET/ROXICET) 5-325 MG tablet TAKE 1 TABLET BY MOUTH UP TO TWICE DAILY AS NEEDED FOR PAIN    . pantoprazole (PROTONIX) 40 MG tablet Take 40 mg by mouth daily.    . ranolazine (RANEXA) 500 MG 12 hr tablet Take 1 tablet (500 mg total) by mouth 2 (two) times daily. 60 tablet 0  . sacubitril-valsartan (ENTRESTO) 49-51 MG Take by mouth.    . sevelamer carbonate (RENVELA) 800 MG tablet Take 4 tablets (3200MG ) by mouth 3 times daily with meals and 2 tablets (1600MG ) by mouth twice daily with  snacks    . sucralfate (CARAFATE) 1 g tablet Take 1 g by mouth 2 (two) times daily.    . diclofenac sodium (VOLTAREN) 1 % GEL Apply 4 g topically 4 (four) times daily. 100 g 3   No current facility-administered medications for this visit.     Review of Systems  Constitutional: Negative for chills, fatigue, fever and unexpected weight change.       + change in appetitie  HENT: Negative for trouble swallowing.  Eyes: Negative for loss of vision.  Respiratory: Negative for cough, shortness of breath and wheezing.  Cardiovascular: Positive for dyspnea with exertion. Negative for chest pain, leg swelling, palpitations and syncope.  GI: Negative for abdominal pain, blood in stool, diarrhea, nausea and vomiting.  GU: Negative for difficulty urinating, dysuria, frequency and hematuria.  Musculoskeletal: Negative for back pain, leg pain and joint pain.       + breast tenderness Skin: Negative for rash.  Neurological: Negative for dizziness, headaches, light-headedness, numbness and seizures.  Psychiatric: Negative for behavioral problem, confusion, depressed mood and sleep disturbance.        Objective:  Objective   Vitals:   12/22/18 0944  BP: 120/72  Weight: 157 lb (71.2 kg)  Height: 5\' 6"  (1.676 m)   Body mass index is 25.34 kg/m.  Physical Exam Vitals signs and nursing note reviewed. Exam conducted with a chaperone present.  Constitutional:      Appearance: She is well-developed.  HENT:     Head: Normocephalic and atraumatic.  Eyes:     Pupils: Pupils are equal, round, and reactive to light.  Cardiovascular:     Rate and Rhythm: Normal rate and regular rhythm.  Pulmonary:     Effort: Pulmonary effort is normal. No respiratory distress.  Chest:     Breasts: Breasts are symmetrical.        Right: Normal. No swelling, bleeding, inverted nipple, mass, nipple discharge, skin change or tenderness.        Left: Tenderness present. No swelling, bleeding, inverted nipple, mass,  nipple discharge or skin change.    Skin:    General: Skin is warm and dry.  Neurological:     Mental Status: She is alert and oriented to person, place, and time.  Psychiatric:        Behavior: Behavior normal.        Thought Content: Thought content normal.        Judgment: Judgment normal.         Assessment/Plan:    58 yo with breast pain/ mastodynia  Will start with conservative first line treatment. If not improved can consider second-line treatments. Patient has significant cardiac history and insists that this pain is very different. She follow regularly with her cardiologist Dr. Humphrey Rolls and reports she last saw him 1 month ago.  1. Recommended compression of breast with Ace- bandage or tight fitting bra. Avoid underwire. Warm compressed okay.  2. Topical NSAID BID-QID application.  3. Follow up on 5-6 months.   More than 20 minutes were spent face to face with the patient in the room with more than 50% of the time spent providing counseling and discussing the plan of management.      Adrian Prows MD [facility] 12/22/2018 10:18 AM

## 2018-12-27 ENCOUNTER — Ambulatory Visit
Admission: RE | Admit: 2018-12-27 | Discharge: 2018-12-27 | Disposition: A | Discharge status: Home / Self Care | Payer: Medicare Other | Source: Ambulatory Visit | Attending: Family | Admitting: Family

## 2018-12-27 ENCOUNTER — Other Ambulatory Visit: Payer: Self-pay

## 2018-12-27 DIAGNOSIS — N644 Mastodynia: Secondary | ICD-10-CM

## 2019-01-12 ENCOUNTER — Other Ambulatory Visit: Payer: Self-pay

## 2019-01-12 ENCOUNTER — Other Ambulatory Visit
Admission: RE | Admit: 2019-01-12 | Discharge: 2019-01-12 | Disposition: A | Discharge status: Home / Self Care | Payer: Medicare Other | Source: Ambulatory Visit | Attending: Gastroenterology | Admitting: Gastroenterology

## 2019-01-12 DIAGNOSIS — Z20828 Contact with and (suspected) exposure to other viral communicable diseases: Secondary | ICD-10-CM | POA: Diagnosis present

## 2019-01-13 LAB — SARS CORONAVIRUS 2 (TAT 6-24 HRS): SARS Coronavirus 2: NEGATIVE

## 2019-01-16 ENCOUNTER — Encounter: Payer: Self-pay | Admitting: *Deleted

## 2019-01-17 ENCOUNTER — Encounter: Admission: RE | Payer: Self-pay | Source: Home / Self Care

## 2019-01-17 ENCOUNTER — Ambulatory Visit: Admission: RE | Admit: 2019-01-17 | Payer: Medicare Other | Source: Home / Self Care | Admitting: Gastroenterology

## 2019-01-17 HISTORY — DX: Inflammatory liver disease, unspecified: K75.9

## 2019-01-17 HISTORY — DX: Atherosclerotic heart disease of native coronary artery without angina pectoris: I25.10

## 2019-01-17 HISTORY — DX: Anemia, unspecified: D64.9

## 2019-01-17 SURGERY — SIGMOIDOSCOPY, FLEXIBLE
Anesthesia: General

## 2019-01-18 ENCOUNTER — Other Ambulatory Visit: Payer: Self-pay

## 2019-01-18 ENCOUNTER — Emergency Department: Payer: Medicare Other

## 2019-01-18 ENCOUNTER — Observation Stay
Admission: EM | Admit: 2019-01-18 | Discharge: 2019-01-19 | Disposition: A | Discharge status: Home / Self Care | Payer: Medicare Other | Attending: Internal Medicine | Admitting: Internal Medicine

## 2019-01-18 DIAGNOSIS — Z79899 Other long term (current) drug therapy: Secondary | ICD-10-CM | POA: Insufficient documentation

## 2019-01-18 DIAGNOSIS — Z7982 Long term (current) use of aspirin: Secondary | ICD-10-CM | POA: Diagnosis not present

## 2019-01-18 DIAGNOSIS — Z7901 Long term (current) use of anticoagulants: Secondary | ICD-10-CM | POA: Diagnosis not present

## 2019-01-18 DIAGNOSIS — I5022 Chronic systolic (congestive) heart failure: Secondary | ICD-10-CM | POA: Diagnosis not present

## 2019-01-18 DIAGNOSIS — I480 Paroxysmal atrial fibrillation: Secondary | ICD-10-CM | POA: Insufficient documentation

## 2019-01-18 DIAGNOSIS — E669 Obesity, unspecified: Secondary | ICD-10-CM | POA: Diagnosis not present

## 2019-01-18 DIAGNOSIS — Z8249 Family history of ischemic heart disease and other diseases of the circulatory system: Secondary | ICD-10-CM | POA: Insufficient documentation

## 2019-01-18 DIAGNOSIS — D631 Anemia in chronic kidney disease: Secondary | ICD-10-CM | POA: Insufficient documentation

## 2019-01-18 DIAGNOSIS — I25110 Atherosclerotic heart disease of native coronary artery with unstable angina pectoris: Principal | ICD-10-CM | POA: Insufficient documentation

## 2019-01-18 DIAGNOSIS — Z8673 Personal history of transient ischemic attack (TIA), and cerebral infarction without residual deficits: Secondary | ICD-10-CM | POA: Insufficient documentation

## 2019-01-18 DIAGNOSIS — N186 End stage renal disease: Secondary | ICD-10-CM | POA: Insufficient documentation

## 2019-01-18 DIAGNOSIS — Z951 Presence of aortocoronary bypass graft: Secondary | ICD-10-CM | POA: Insufficient documentation

## 2019-01-18 DIAGNOSIS — I4510 Unspecified right bundle-branch block: Secondary | ICD-10-CM | POA: Insufficient documentation

## 2019-01-18 DIAGNOSIS — Z992 Dependence on renal dialysis: Secondary | ICD-10-CM | POA: Diagnosis not present

## 2019-01-18 DIAGNOSIS — E785 Hyperlipidemia, unspecified: Secondary | ICD-10-CM | POA: Diagnosis not present

## 2019-01-18 DIAGNOSIS — Z955 Presence of coronary angioplasty implant and graft: Secondary | ICD-10-CM | POA: Insufficient documentation

## 2019-01-18 DIAGNOSIS — Z20828 Contact with and (suspected) exposure to other viral communicable diseases: Secondary | ICD-10-CM | POA: Diagnosis not present

## 2019-01-18 DIAGNOSIS — F17210 Nicotine dependence, cigarettes, uncomplicated: Secondary | ICD-10-CM | POA: Insufficient documentation

## 2019-01-18 DIAGNOSIS — R079 Chest pain, unspecified: Secondary | ICD-10-CM | POA: Diagnosis present

## 2019-01-18 DIAGNOSIS — I132 Hypertensive heart and chronic kidney disease with heart failure and with stage 5 chronic kidney disease, or end stage renal disease: Secondary | ICD-10-CM | POA: Diagnosis not present

## 2019-01-18 DIAGNOSIS — I493 Ventricular premature depolarization: Secondary | ICD-10-CM | POA: Insufficient documentation

## 2019-01-18 DIAGNOSIS — I252 Old myocardial infarction: Secondary | ICD-10-CM | POA: Diagnosis not present

## 2019-01-18 LAB — CBC WITH DIFFERENTIAL/PLATELET
Abs Immature Granulocytes: 0.02 10*3/uL (ref 0.00–0.07)
Basophils Absolute: 0 10*3/uL (ref 0.0–0.1)
Basophils Relative: 1 %
Eosinophils Absolute: 0.1 10*3/uL (ref 0.0–0.5)
Eosinophils Relative: 3 %
HCT: 35.9 % — ABNORMAL LOW (ref 36.0–46.0)
Hemoglobin: 11.6 g/dL — ABNORMAL LOW (ref 12.0–15.0)
Immature Granulocytes: 1 %
Lymphocytes Relative: 30 %
Lymphs Abs: 1 10*3/uL (ref 0.7–4.0)
MCH: 32.1 pg (ref 26.0–34.0)
MCHC: 32.3 g/dL (ref 30.0–36.0)
MCV: 99.4 fL (ref 80.0–100.0)
Monocytes Absolute: 0.5 10*3/uL (ref 0.1–1.0)
Monocytes Relative: 14 %
Neutro Abs: 1.7 10*3/uL (ref 1.7–7.7)
Neutrophils Relative %: 51 %
Platelets: 163 10*3/uL (ref 150–400)
RBC: 3.61 MIL/uL — ABNORMAL LOW (ref 3.87–5.11)
RDW: 16 % — ABNORMAL HIGH (ref 11.5–15.5)
WBC: 3.4 10*3/uL — ABNORMAL LOW (ref 4.0–10.5)
nRBC: 0 % (ref 0.0–0.2)

## 2019-01-18 LAB — COMPREHENSIVE METABOLIC PANEL
ALT: 9 U/L (ref 0–44)
AST: 17 U/L (ref 15–41)
Albumin: 4.2 g/dL (ref 3.5–5.0)
Alkaline Phosphatase: 132 U/L — ABNORMAL HIGH (ref 38–126)
Anion gap: 12 (ref 5–15)
BUN: 30 mg/dL — ABNORMAL HIGH (ref 6–20)
CO2: 27 mmol/L (ref 22–32)
Calcium: 8.6 mg/dL — ABNORMAL LOW (ref 8.9–10.3)
Chloride: 99 mmol/L (ref 98–111)
Creatinine, Ser: 4.85 mg/dL — ABNORMAL HIGH (ref 0.44–1.00)
GFR calc Af Amer: 11 mL/min — ABNORMAL LOW (ref >60)
GFR calc non Af Amer: 9 mL/min — ABNORMAL LOW (ref >60)
Glucose, Bld: 92 mg/dL (ref 70–99)
Potassium: 3.8 mmol/L (ref 3.5–5.1)
Sodium: 138 mmol/L (ref 135–145)
Total Bilirubin: 0.7 mg/dL (ref 0.3–1.2)
Total Protein: 8 g/dL (ref 6.5–8.1)

## 2019-01-18 LAB — BRAIN NATRIURETIC PEPTIDE: B Natriuretic Peptide: 2515 pg/mL — ABNORMAL HIGH (ref 0.0–100.0)

## 2019-01-18 LAB — TROPONIN I (HIGH SENSITIVITY)
Troponin I (High Sensitivity): 30 ng/L — ABNORMAL HIGH (ref <18)
Troponin I (High Sensitivity): 33 ng/L — ABNORMAL HIGH (ref <18)
Troponin I (High Sensitivity): 31 ng/L — ABNORMAL HIGH (ref <18)
Troponin I (High Sensitivity): 32 ng/L — ABNORMAL HIGH (ref <18)

## 2019-01-18 LAB — PHOSPHORUS: Phosphorus: 2.2 mg/dL — ABNORMAL LOW (ref 2.5–4.6)

## 2019-01-18 LAB — LIPID PANEL
Cholesterol: 140 mg/dL (ref 0–200)
HDL: 43 mg/dL (ref >40)
LDL Cholesterol: 67 mg/dL (ref 0–99)
Total CHOL/HDL Ratio: 3.3 RATIO
Triglycerides: 148 mg/dL (ref <150)
VLDL: 30 mg/dL (ref 0–40)

## 2019-01-18 LAB — MRSA PCR SCREENING: MRSA by PCR: POSITIVE — AB

## 2019-01-18 LAB — MAGNESIUM: Magnesium: 2.1 mg/dL (ref 1.7–2.4)

## 2019-01-18 LAB — APTT: aPTT: 34 seconds (ref 24–36)

## 2019-01-18 LAB — PROTIME-INR
INR: 1.5 — ABNORMAL HIGH (ref 0.8–1.2)
Prothrombin Time: 17.8 seconds — ABNORMAL HIGH (ref 11.4–15.2)

## 2019-01-18 MED ORDER — APIXABAN 5 MG PO TABS
5.0000 mg | ORAL_TABLET | Freq: Two times a day (BID) | ORAL | Status: DC
  Administered 2019-01-18 – 2019-01-19 (×3): 5 mg via ORAL
  Filled 2019-01-18 (×3): qty 1

## 2019-01-18 MED ORDER — SUCRALFATE 1 G PO TABS
1.0000 g | ORAL_TABLET | Freq: Two times a day (BID) | ORAL | Status: DC
  Administered 2019-01-18 – 2019-01-19 (×3): 1 g via ORAL
  Filled 2019-01-18 (×3): qty 1

## 2019-01-18 MED ORDER — ACETAMINOPHEN 325 MG PO TABS: 650.0000 mg | ORAL_TABLET | ORAL | Status: DC | PRN

## 2019-01-18 MED ORDER — AMIODARONE HCL 200 MG PO TABS: 200.0000 mg | ORAL_TABLET | Freq: Two times a day (BID) | ORAL | Status: DC

## 2019-01-18 MED ORDER — RANOLAZINE ER 500 MG PO TB12
500.0000 mg | ORAL_TABLET | Freq: Two times a day (BID) | ORAL | Status: DC
  Administered 2019-01-18 (×2): 500 mg via ORAL
  Filled 2019-01-18 (×4): qty 1

## 2019-01-18 MED ORDER — ISOSORBIDE MONONITRATE ER 60 MG PO TB24: 60.0000 mg | ORAL_TABLET | Freq: Every day | ORAL | Status: DC

## 2019-01-18 MED ORDER — RENA-VITE PO TABS
1.0000 | ORAL_TABLET | Freq: Every day | ORAL | Status: DC
  Administered 2019-01-18: 1 via ORAL
  Filled 2019-01-18 (×2): qty 1

## 2019-01-18 MED ORDER — DIGOXIN 125 MCG PO TABS
0.0625 mg | ORAL_TABLET | Freq: Every day | ORAL | Status: DC
  Filled 2019-01-18 (×2): qty 0.5

## 2019-01-18 MED ORDER — SEVELAMER CARBONATE 800 MG PO TABS
2400.0000 mg | ORAL_TABLET | Freq: Three times a day (TID) | ORAL | Status: DC
  Administered 2019-01-19 (×2): 2400 mg via ORAL
  Filled 2019-01-18 (×3): qty 3

## 2019-01-18 MED ORDER — LIDOCAINE-PRILOCAINE 2.5-2.5 % EX CREA
TOPICAL_CREAM | Freq: Once | CUTANEOUS | Status: AC
  Administered 2019-01-19: 09:00:00 via TOPICAL
  Filled 2019-01-18: qty 5

## 2019-01-18 MED ORDER — HYDROCODONE-ACETAMINOPHEN 5-325 MG PO TABS
1.0000 | ORAL_TABLET | ORAL | Status: DC | PRN
  Administered 2019-01-18 – 2019-01-19 (×5): 1 via ORAL
  Filled 2019-01-18 (×5): qty 1

## 2019-01-18 MED ORDER — HYDROXYZINE HCL 25 MG PO TABS
50.0000 mg | ORAL_TABLET | Freq: Two times a day (BID) | ORAL | Status: DC | PRN
  Administered 2019-01-18: 50 mg via ORAL
  Filled 2019-01-18: qty 2

## 2019-01-18 MED ORDER — MUPIROCIN 2 % EX OINT
1.0000 "application " | TOPICAL_OINTMENT | Freq: Two times a day (BID) | CUTANEOUS | Status: DC
  Filled 2019-01-18: qty 22

## 2019-01-18 MED ORDER — ONDANSETRON HCL 4 MG/2ML IJ SOLN: 4.0000 mg | Freq: Four times a day (QID) | INTRAMUSCULAR | Status: DC | PRN

## 2019-01-18 MED ORDER — ASPIRIN 81 MG PO CHEW
81.0000 mg | CHEWABLE_TABLET | Freq: Every day | ORAL | Status: DC
  Administered 2019-01-19: 81 mg via ORAL
  Filled 2019-01-18: qty 1

## 2019-01-18 MED ORDER — NEPRO/CARBSTEADY PO LIQD: 237.0000 mL | Freq: Two times a day (BID) | ORAL | Status: DC

## 2019-01-18 MED ORDER — LEVOTHYROXINE SODIUM 50 MCG PO TABS: 75.0000 ug | ORAL_TABLET | Freq: Every day | ORAL | Status: DC

## 2019-01-18 MED ORDER — FENTANYL CITRATE (PF) 100 MCG/2ML IJ SOLN
25.0000 ug | Freq: Once | INTRAMUSCULAR | Status: AC
  Administered 2019-01-18: 25 ug via INTRAVENOUS
  Filled 2019-01-18: qty 2

## 2019-01-18 MED ORDER — PANTOPRAZOLE SODIUM 40 MG PO TBEC
40.0000 mg | DELAYED_RELEASE_TABLET | Freq: Every day | ORAL | Status: DC
  Administered 2019-01-18 – 2019-01-19 (×2): 40 mg via ORAL
  Filled 2019-01-18 (×2): qty 1

## 2019-01-18 MED ORDER — MUPIROCIN 2 % EX OINT: 1.0000 "application " | TOPICAL_OINTMENT | Freq: Two times a day (BID) | CUTANEOUS | Status: DC

## 2019-01-18 MED ORDER — CINACALCET HCL 30 MG PO TABS
60.0000 mg | ORAL_TABLET | Freq: Every evening | ORAL | Status: DC
  Administered 2019-01-18 – 2019-01-19 (×2): 60 mg via ORAL
  Filled 2019-01-18 (×2): qty 2

## 2019-01-18 MED ORDER — ASPIRIN 81 MG PO CHEW
324.0000 mg | CHEWABLE_TABLET | Freq: Once | ORAL | Status: AC
  Administered 2019-01-18: 08:00:00 324 mg via ORAL
  Filled 2019-01-18: qty 4

## 2019-01-18 MED ORDER — CHLORHEXIDINE GLUCONATE CLOTH 2 % EX PADS
6.0000 | MEDICATED_PAD | Freq: Every day | CUTANEOUS | Status: DC
  Administered 2019-01-19: 6 via TOPICAL

## 2019-01-18 NOTE — Care Management Obs Status (Signed)
[place] NOTIFICATION   Patient Details  Name: MALAAK STACH MRN: [id number] Date of Birth: Oct 28, 1960   Medicare Observation Status Notification Given:  No  Unable to provide [place] letter. Cannot currently enter patient's room, and no family members answered the phone.   Tania Rigo Letts, LCSW 01/18/2019, 12:46 PM

## 2019-01-18 NOTE — H&P (Signed)
[facility] NAME: [name]    MR#:  [id number]  DATE OF BIRTH:  Jun 09, 1961  DATE OF ADMISSION:  01/18/2019  PRIMARY CARE PHYSICIAN: [name], MD   REQUESTING/REFERRING PHYSICIAN: Lilia Pro., MD  CHIEF COMPLAINT:   Chief Complaint  Patient presents with  . Chest Pain    HISTORY OF PRESENT ILLNESS:  [name]  is a 58 y.o. female with a known history of ESRD on dialysis, CAD, status post CABG, CHF, paroxysmal A. fib on Eliquis being admitted for chest pain rule out.  Localizes it to the center of her chest.  Describes it as sharp and pressure.  It started while getting dialysis, and has been present for approximately 1 hour.  She states it does feel like her prior heart attacks.  She has some associated shortness of breath, but no diaphoresis or nausea.  She did have a cardiac catheterization in February 2020 which showed patent grafts.  However, over the last several weeks she has had increasing episodes of shortness of breath, and is scheduled for stress test tomorrow but before that now she is here we will go and get a stress test while she is here. PAST MEDICAL HISTORY:   Past Medical History:  Diagnosis Date  . Anemia    chronic disease  . CHF (congestive heart failure) ([place])   . Coronary artery disease   . Heart failure ([place])   . Hepatitis    history of hep c  . Hyperlipidemia   . Hypertension   . Myocardial infarction ([place])   . Paroxysmal atrial fibrillation (HCC)   . Renal failure   . Renal insufficiency   . Stroke [facility]) 2011    PAST SURGICAL HISTORY:   Past Surgical History:  Procedure Laterality Date  . CORONARY ANGIOPLASTY WITH STENT PLACEMENT  2013  . CORONARY ARTERY BYPASS GRAFT    . DIALYSIS FISTULA CREATION    . LEFT HEART CATH AND CORS/GRAFTS ANGIOGRAPHY N/A 08/08/2018   Procedure: LEFT HEART CATH AND CORS/GRAFTS ANGIOGRAPHY;  Surgeon: Dionisio David, MD;  Location: [facility];  Service: Cardiovascular;  Laterality: N/A;    SOCIAL HISTORY:   Social History   Tobacco Use  . Smoking status: Current Some Day Smoker    Packs/day: 0.10    Years: 20.00    Pack years: 2.00    Types: Cigarettes  . Smokeless tobacco: Never Used  Substance Use Topics  . Alcohol use: No    FAMILY HISTORY:   Family History  Problem Relation Age of Onset  . Hypertension Other   . Cancer Other   . Renal Disease Other   . Ovarian cancer Mother   . Kidney disease Father   . Breast cancer Maternal Aunt   . Breast cancer Maternal Grandmother     DRUG ALLERGIES:   Allergies  Allergen Reactions  . Morphine And Related Hives  . Levaquin [Levofloxacin] Itching    Severe itching; prickly sensation  . Latex Rash  . Tape Rash    REVIEW OF SYSTEMS:   Review of Systems  Constitutional: Negative for diaphoresis, fever, malaise/fatigue and weight loss.  HENT: Negative for ear discharge, ear pain, hearing loss, nosebleeds, sore throat and tinnitus.   Eyes: Negative for blurred vision and pain.  Respiratory: Positive for shortness of breath. Negative for cough, hemoptysis and wheezing.   Cardiovascular: Positive for chest pain. Negative for palpitations, orthopnea and leg swelling.  Gastrointestinal: Negative for abdominal pain, blood in stool, constipation, diarrhea, heartburn, nausea and vomiting.  Genitourinary: Negative for dysuria, frequency and urgency.  Musculoskeletal: Negative for back pain and myalgias.  Skin: Negative for itching and rash.  Neurological: Negative for dizziness, tingling, tremors, focal weakness, seizures, weakness and headaches.  Psychiatric/Behavioral: Negative for depression. The patient is not nervous/anxious.     MEDICATIONS AT HOME:   Prior to Admission medications   Medication Sig Start Date End Date Taking? Authorizing Provider  amiodarone (PACERONE) 200 MG tablet Take 200 mg by mouth 2 (two) times daily. 01/10/19  Yes  [provider]  aspirin 81 MG chewable tablet Chew 81 mg by mouth daily.   Yes [provider]  cinacalcet (SENSIPAR) 60 MG tablet Take 60 mg by mouth every evening.    Yes [provider]  diclofenac sodium (VOLTAREN) 1 % GEL Apply 4 g topically 4 (four) times daily. 12/22/18  Yes Schuman, Christanna R, MD  ELIQUIS 5 MG TABS tablet Take 1 tablet (5 mg total) by mouth 2 (two) times daily. 02/02/18  Yes Mayo, Pete Pelt, MD  hydrOXYzine (ATARAX/VISTARIL) 50 MG tablet Take 50 mg by mouth 2 (two) times daily as needed for itching.    Yes [provider]  isosorbide mononitrate (IMDUR) 60 MG 24 hr tablet Take 60 mg by mouth daily.  09/14/18  Yes [provider]  levothyroxine (SYNTHROID) 75 MCG tablet TAKE 1 TABLET BY MOUTH ONCE DAILY IN THE MORNING 11/02/18  Yes [provider]  midodrine (PROAMATINE) 10 MG tablet Take 10-20 mg by mouth daily. (for low BP associated with dialysis)   Yes [provider]  oxyCODONE-acetaminophen (PERCOCET/ROXICET) 5-325 MG tablet TAKE 1 TABLET BY MOUTH UP TO TWICE DAILY AS NEEDED FOR PAIN 12/08/18  Yes [provider]  pantoprazole (PROTONIX) 40 MG tablet Take 40 mg by mouth daily.   Yes [provider]  sacubitril-valsartan (ENTRESTO) 49-51 MG Take 1 tablet by mouth 2 (two) times daily.  08/01/18  Yes [provider]  sevelamer carbonate (RENVELA) 800 MG tablet Take 2,400 mg by mouth 3 (three) times daily with meals. And 1 tablet with snacks   Yes [provider]      VITAL SIGNS:  Blood pressure 108/72, pulse (!) 55, temperature (!) 97.5 F (36.4 C), temperature source Oral, resp. rate 19, height 5\' 6"  (1.676 m), weight 70.3 kg, SpO2 100 %.  PHYSICAL EXAMINATION:  Physical Exam  GENERAL:  58 y.o.-year-old patient lying in the bed with no acute distress.  EYES: Pupils equal, round, reactive to light and accommodation. No scleral icterus. Extraocular muscles intact.  HEENT:  Head atraumatic, normocephalic. Oropharynx and nasopharynx clear.  NECK:  Supple, no jugular venous distention. No thyroid enlargement, no tenderness.  LUNGS: Normal breath sounds bilaterally, no wheezing, rales,rhonchi or crepitation. No use of accessory muscles of respiration.  CARDIOVASCULAR: S1, S2 normal. No murmurs, rubs, or gallops.  ABDOMEN: Soft, nontender, nondistended. Bowel sounds present. No organomegaly or mass.  EXTREMITIES: No pedal edema, cyanosis, or clubbing.  NEUROLOGIC: Cranial nerves II through XII are intact. Muscle strength 5/5 in all extremities. Sensation intact. Gait not checked.  PSYCHIATRIC: The patient is alert and oriented x 3.  SKIN: No obvious rash, lesion, or ulcer.   LABORATORY PANEL:   CBC Recent Labs  Lab 01/18/19 0816  WBC 3.4*  HGB 11.6*  HCT 35.9*  PLT 163   ------------------------------------------------------------------------------------------------------------------  Chemistries  Recent Labs  Lab 01/18/19 0816  NA 138  K 3.8  CL 99  CO2 27  GLUCOSE 92  BUN 30*  CREATININE 4.85*  CALCIUM 8.6*  MG 2.1  AST 17  ALT 9  ALKPHOS 132*  BILITOT 0.7   ------------------------------------------------------------------------------------------------------------------  Cardiac Enzymes No results for input(s): TROPONINI in the last 168 hours. ------------------------------------------------------------------------------------------------------------------  RADIOLOGY:  Dg Chest Port 1 View  Result Date: 01/18/2019 CLINICAL DATA:  Chest pain. EXAM: PORTABLE CHEST 1 VIEW COMPARISON:  August 24, 2018 FINDINGS: Postsurgical changes from CABG. Enlarged cardiac silhouette. Mediastinal contours appear intact. Mild pulmonary vascular congestion. Osseous structures are without acute abnormality. Soft tissues are grossly normal. IMPRESSION: Markedly enlarged cardiac silhouette. Mild pulmonary vascular congestion. Electronically Signed   By: Fidela Salisbury M.D.   On: 01/18/2019 08:46   IMPRESSION AND PLAN:  58 year old female with history of ischemic cardiomyopathy with coronary bypass grafting x3 with patent grafts by cath earlier this year in February  *Unstable angina: chest pain with atypical features.  - Has thus far ruled out with negative troponins.  - EKG shows no ischemia but does appear to suggest an atrial tachycardia which is new.  -We will order Myoview tomorrow  -Monitor on telemetry, get serial troponins -Cardiology consultation  * End Stage Renal Disease: on hemodialysis -Nephrology aware and will let them decide dialysis need while she is here  * Hypertension: Controlled at this time  * Anemia with chronic kidney disease: hemoglobin 11.6.  - EPO as outpatient.      All the records are reviewed and case discussed with ED provider. Management plans discussed with the patient, Cardio and nephrology and they are in agreement.  CODE STATUS: Full code  TOTAL TIME TAKING CARE OF THIS PATIENT: 35 minutes.    Max Sane M.D on 01/18/2019 at 2:39 PM  Between 7am to 6pm - Pager - [phone]  After 6pm go to [email or web address] - Proofreader  [facility]  Office  [phone]  CC: Primary care physician; [name], MD   Note: This dictation was prepared with Dragon dictation along with smaller phrase technology. Any transcriptional errors that result from this process are unintentional.

## 2019-01-18 NOTE — Progress Notes (Signed)
[facility]  ROUNDING NOTE   Subjective:   [name] brought to ED from hemodialysis clinic after one hour of hemodialysis treatment. Patient with chest pain.   Scheduled for stress test for tomorrow.   Patient is requesting to hold off on getting the rest of her dialysis treatment.   Objective:  Vital signs in last 24 hours:  Temp:  [97.5 F (36.4 C)-98.2 F (36.8 C)] 97.5 F (36.4 C) (08/05 1339) Pulse Rate:  [47-62] 55 (08/05 1339) Resp:  [14-19] 19 (08/05 1339) BP: (92-121)/(50-83) 108/72 (08/05 1339) SpO2:  [96 %-100 %] 100 % (08/05 1339) Weight:  [70.3 kg] 70.3 kg (08/05 0812)  Weight change:  Filed Weights   01/18/19 0812  Weight: 70.3 kg    Intake/Output: No intake/output data recorded.   Intake/Output this shift:  No intake/output data recorded.  Physical Exam: General: NAD, sitting in stretcher  Head: Normocephalic, atraumatic. Moist oral mucosal membranes  Eyes: Anicteric, PERRL  Neck: Supple, trachea midline  Lungs:  Clear to auscultation  Heart: Regular rate and rhythm  Abdomen:  Soft, nontender,   Extremities:  no peripheral edema.  Neurologic: Nonfocal, moving all four extremities  Skin: No lesions  Access: Left AVG    Basic Metabolic Panel: Recent Labs  Lab 01/18/19 0816  NA 138  K 3.8  CL 99  CO2 27  GLUCOSE 92  BUN 30*  CREATININE 4.85*  CALCIUM 8.6*  MG 2.1  PHOS 2.2*    Liver Function Tests: Recent Labs  Lab 01/18/19 0816  AST 17  ALT 9  ALKPHOS 132*  BILITOT 0.7  PROT 8.0  ALBUMIN 4.2   No results for input(s): LIPASE, AMYLASE in the last 168 hours. No results for input(s): AMMONIA in the last 168 hours.  CBC: Recent Labs  Lab 01/18/19 0816  WBC 3.4*  NEUTROABS 1.7  HGB 11.6*  HCT 35.9*  MCV 99.4  PLT 163    Cardiac Enzymes: No results for input(s): CKTOTAL, CKMB, CKMBINDEX, TROPONINI in the last 168 hours.  BNP: Invalid input(s): POCBNP  CBG: No results for input(s):  GLUCAP in the last 168 hours.  Microbiology: Results for orders placed or performed during the hospital encounter of 01/12/19  SARS Coronavirus 2 (Performed in [facility] hospital lab)     Status: None   Collection Time: 01/12/19 11:46 AM   Specimen: Nasal Swab  Result Value Ref Range Status   SARS Coronavirus 2 NEGATIVE NEGATIVE Final    Comment: (NOTE) SARS-CoV-2 target nucleic acids are NOT DETECTED. The SARS-CoV-2 RNA is generally detectable in upper and lower respiratory specimens during the acute phase of infection. Negative results do not preclude SARS-CoV-2 infection, do not rule out co-infections with other pathogens, and should not be used as the sole basis for treatment or other patient management decisions. Negative results must be combined with clinical observations, patient history, and epidemiological information. The expected result is Negative. Fact Sheet for Patients: SugarRoll.be Fact Sheet for Healthcare Providers: https://www.woods-mathews.com/ This test is not yet approved or cleared by the [place] FDA and  has been authorized for detection and/or diagnosis of SARS-CoV-2 by FDA under an Emergency Use Authorization (EUA). This EUA will remain  in effect (meaning this test can be used) for the duration of the COVID-19 declaration under Section 56 4(b)(1) of the Act, 21 U.S.C. section 360bbb-3(b)(1), unless the authorization is terminated or revoked sooner. Performed at [facility], [place] [address], [place], [place] [address]  Coagulation Studies: Recent Labs    01/18/19 0816  LABPROT 17.8*  INR 1.5*    Urinalysis: No results for input(s): COLORURINE, LABSPEC, PHURINE, GLUCOSEU, HGBUR, BILIRUBINUR, KETONESUR, PROTEINUR, UROBILINOGEN, NITRITE, LEUKOCYTESUR in the last 72 hours.  Invalid input(s): APPERANCEUR    Imaging: Dg Chest Port 1 View  Result Date: 01/18/2019 CLINICAL DATA:  Chest pain.  EXAM: PORTABLE CHEST 1 VIEW COMPARISON:  August 24, 2018 FINDINGS: Postsurgical changes from CABG. Enlarged cardiac silhouette. Mediastinal contours appear intact. Mild pulmonary vascular congestion. Osseous structures are without acute abnormality. Soft tissues are grossly normal. IMPRESSION: Markedly enlarged cardiac silhouette. Mild pulmonary vascular congestion. Electronically Signed   By: Fidela Salisbury M.D.   On: 01/18/2019 08:46     Medications:       Assessment/ Plan:  [name] is a 58 y.o. black female with end stage renal disease on hemodialysis, hypertension, CVA, atrial fibrillation, coronary artery disease, congestive heart failure, pulmonary embolism presents for Chest pain in adult [R07.9]  [name] MWF 69kg   1. End Stage Renal Disease: on hemodialysis Completed 60 minutes out of her prescribed 3 1/2 hours treatment. Patient is refusing further dialysis treatment for today.  No acute indication to resume dialysis Will hold off on dialysis until tomorrow.   2. Hypertension: hypotensive today. Currently not on any blood pressure agents.   3. Anemia with chronic kidney disease: hemoglobin 11.6.  - EPO as outpatient.   4. Secondary Hyperparathyroidism: outpatient labs from 7/13 PTH elevated at 845, phosphorus and calcium at goal.  - restart cinacalcet and sevelamer.    LOS: 0 [name] 8/5/20201:42 PM

## 2019-01-18 NOTE — ED Triage Notes (Signed)
Pt comes with CP during dialysis. Pt was halfway done with tx and supposed to be getting extra fluid pulled off and ppossibly it was pulled off too fast. Pt also has some SOB with the CP. Pt c/o 10/10 pain generalized in her chest. Pt states that it feels like when she had a heart attack in the past.

## 2019-01-18 NOTE — ED Notes (Signed)
Hospitalist to bedside.

## 2019-01-18 NOTE — ED Notes (Signed)
Pt wears 2.5-3L nasal cannula at baseline.

## 2019-01-18 NOTE — ED Notes (Signed)
ED TO INPATIENT HANDOFF REPORT  ED Nurse Name and Phone #: Caryl Pina, Loraine  S Name/Age/Gender [name] 59 y.o. female Room/Bed: ED03A/ED03A  Code Status   Code Status: Prior  Home/SNF/Other Home Patient oriented to: self, place, time and situation Is this baseline? Yes   Triage Complete: Triage complete  Chief Complaint chest pain   Triage Note Pt comes with CP during dialysis. Pt was halfway done with tx and supposed to be getting extra fluid pulled off and ppossibly it was pulled off too fast. Pt also has some SOB with the CP. Pt c/o 10/10 pain generalized in her chest. Pt states that it feels like when she had a heart attack in the past.    Allergies Allergies  Allergen Reactions  . Morphine And Related Hives  . Levaquin [Levofloxacin] Itching    Severe itching; prickly sensation  . Latex Rash  . Tape Rash    Level of Care/Admitting Diagnosis ED Disposition    ED Disposition Condition [facility]: [place] [100120]  Level of Care: Telemetry [5]  Covid Evaluation: Asymptomatic Screening Protocol (No Symptoms)  Diagnosis: Chest pain [128786]  Admitting Physician: Max Sane [[id number]]  Attending Physician: Max Sane [[id number]]  PT Class (Do Not Modify): Observation [104]  PT Acc Code (Do Not Modify): Observation [10022]       B Medical/Surgery History Past Medical History:  Diagnosis Date  . Anemia    chronic disease  . CHF (congestive heart failure) ([place])   . Coronary artery disease   . Heart failure ([place])   . Hepatitis    history of hep c  . Hyperlipidemia   . Hypertension   . Myocardial infarction ([place])   . Paroxysmal atrial fibrillation ([facility])   . Renal failure   . Renal insufficiency   . Stroke [facility]) 2011   Past Surgical History:  Procedure Laterality Date  . CORONARY ANGIOPLASTY WITH STENT PLACEMENT  2013  . CORONARY ARTERY BYPASS GRAFT    . DIALYSIS FISTULA CREATION    . LEFT HEART  CATH AND CORS/GRAFTS ANGIOGRAPHY N/A 08/08/2018   Procedure: LEFT HEART CATH AND CORS/GRAFTS ANGIOGRAPHY;  Surgeon: Dionisio David, MD;  Location: [facility];  Service: Cardiovascular;  Laterality: N/A;     A IV Location/Drains/Wounds Patient Lines/Drains/Airways Status   Active Line/Drains/Airways    Name:   Placement date:   Placement time:   Site:   Days:   Peripheral IV 01/18/19 Right Forearm   01/18/19    0823    Forearm   less than 1   Fistula / Graft Left Upper arm Arteriovenous fistula   -    -    Upper arm      Fistula / Graft Left Upper arm Arteriovenous vein graft   -    -    Upper arm             Intake/Output Last 24 hours No intake or output data in the 24 hours ending 01/18/19 1201  Labs/Imaging Results for orders placed or performed during the hospital encounter of 01/18/19 (from the past 48 hour(s))  CBC with Differential     Status: Abnormal   Collection Time: 01/18/19  8:16 AM  Result Value Ref Range   WBC 3.4 (L) 4.0 - 10.5 K/uL   RBC 3.61 (L) 3.87 - 5.11 MIL/uL   Hemoglobin 11.6 (L) 12.0 - 15.0 g/dL   HCT 35.9 (L) 36.0 -  46.0 %   MCV 99.4 80.0 - 100.0 fL   MCH 32.1 26.0 - 34.0 pg   MCHC 32.3 30.0 - 36.0 g/dL   RDW 16.0 (H) 11.5 - 15.5 %   Platelets 163 150 - 400 K/uL   nRBC 0.0 0.0 - 0.2 %   Neutrophils Relative % 51 %   Neutro Abs 1.7 1.7 - 7.7 K/uL   Lymphocytes Relative 30 %   Lymphs Abs 1.0 0.7 - 4.0 K/uL   Monocytes Relative 14 %   Monocytes Absolute 0.5 0.1 - 1.0 K/uL   Eosinophils Relative 3 %   Eosinophils Absolute 0.1 0.0 - 0.5 K/uL   Basophils Relative 1 %   Basophils Absolute 0.0 0.0 - 0.1 K/uL   Immature Granulocytes 1 %   Abs Immature Granulocytes 0.02 0.00 - 0.07 K/uL    Comment: Performed at [facility], [address] [place], [place] [address]  Protime-INR     Status: Abnormal   Collection Time: 01/18/19  8:16 AM  Result Value Ref Range   Prothrombin Time 17.8 (H) 11.4 - 15.2 seconds   INR 1.5 (H) 0.8 -  1.2    Comment: (NOTE) INR goal varies based on device and disease states. Performed at [facility], [address] [place], [place] [address]   Comprehensive metabolic panel     Status: Abnormal   Collection Time: 01/18/19  8:16 AM  Result Value Ref Range   Sodium 138 135 - 145 mmol/L   Potassium 3.8 3.5 - 5.1 mmol/L   Chloride 99 98 - 111 mmol/L   CO2 27 22 - 32 mmol/L   Glucose, Bld 92 70 - 99 mg/dL   BUN 30 (H) 6 - 20 mg/dL   Creatinine, Ser 4.85 (H) 0.44 - 1.00 mg/dL   Calcium 8.6 (L) 8.9 - 10.3 mg/dL   Total Protein 8.0 6.5 - 8.1 g/dL   Albumin 4.2 3.5 - 5.0 g/dL   AST 17 15 - 41 U/L   ALT 9 0 - 44 U/L   Alkaline Phosphatase 132 (H) 38 - 126 U/L   Total Bilirubin 0.7 0.3 - 1.2 mg/dL   GFR calc non Af Amer 9 (L) >60 mL/min   GFR calc Af Amer 11 (L) >60 mL/min   Anion gap 12 5 - 15    Comment: Performed at [facility], [place], [place] [address]  Troponin I (High Sensitivity)     Status: Abnormal   Collection Time: 01/18/19  8:16 AM  Result Value Ref Range   Troponin I (High Sensitivity) 33 (H) <18 ng/L    Comment: (NOTE) Elevated high sensitivity troponin I (hsTnI) values and significant  changes across serial measurements may suggest ACS but many other  chronic and acute conditions are known to elevate hsTnI results.  Refer to the "Links" section for chest pain algorithms and additional  guidance. Performed at [facility], [address] [place], [place] [address]   Brain natriuretic peptide     Status: Abnormal   Collection Time: 01/18/19  8:16 AM  Result Value Ref Range   B Natriuretic Peptide 2,515.0 (H) 0.0 - 100.0 pg/mL    Comment: Performed at [facility], [address] [name], [place] [address]  APTT     Status: None   Collection Time: 01/18/19  8:16 AM  Result Value Ref Range   aPTT 34 24 - 36 seconds    Comment: Performed at [facility],  [place], [place]  [address]  Magnesium     Status: None   Collection Time: 01/18/19  8:16 AM  Result Value Ref Range   Magnesium 2.1 1.7 - 2.4 mg/dL    Comment: Performed at [facility], [address] [place], [place] [address]  Phosphorus     Status: Abnormal   Collection Time: 01/18/19  8:16 AM  Result Value Ref Range   Phosphorus 2.2 (L) 2.5 - 4.6 mg/dL    Comment: Performed at [facility], [place], [place] [address]  Troponin I (High Sensitivity)     Status: Abnormal   Collection Time: 01/18/19  8:16 AM  Result Value Ref Range   Troponin I (High Sensitivity) 30 (H) <18 ng/L    Comment: (NOTE) Elevated high sensitivity troponin I (hsTnI) values and significant  changes across serial measurements may suggest ACS but many other  chronic and acute conditions are known to elevate hsTnI results.  Refer to the "Links" section for chest pain algorithms and additional  guidance. Performed at [facility], [address] [place], [place] [address]    Dg Chest Port 1 View  Result Date: 01/18/2019 CLINICAL DATA:  Chest pain. EXAM: PORTABLE CHEST 1 VIEW COMPARISON:  August 24, 2018 FINDINGS: Postsurgical changes from CABG. Enlarged cardiac silhouette. Mediastinal contours appear intact. Mild pulmonary vascular congestion. Osseous structures are without acute abnormality. Soft tissues are grossly normal. IMPRESSION: Markedly enlarged cardiac silhouette. Mild pulmonary vascular congestion. Electronically Signed   By: Fidela Salisbury M.D.   On: 01/18/2019 08:46    Pending Labs Unresulted Labs (From admission, onward)    Start     Ordered   01/18/19 1148  SARS CORONAVIRUS 2 Nasal Swab Aptima Multi Swab  (Asymptomatic/Tier 2 Patients Labs)  Once,   STAT    Question Answer Comment  Is this test for diagnosis or screening Screening   Symptomatic for COVID-19 as defined by CDC No   Hospitalized for COVID-19 No   Admitted to ICU for COVID-19 No   Previously tested  for COVID-19 Yes   Resident in a congregate (group) care setting No   Employed in healthcare setting No   Pregnant No      01/18/19 1147   Signed and Held  Lipid panel  Once,   R     Signed and Held          Vitals/Pain Today's Vitals   01/18/19 0904 01/18/19 1000 01/18/19 1041 01/18/19 1100  BP:  112/67  121/76  Pulse:  (!) 47    Resp:  14  17  Temp:      TempSrc:      SpO2:  96%  100%  Weight:      Height:      PainSc: Asleep  0-No pain     Isolation Precautions No active isolations  Medications Medications  aspirin chewable tablet 324 mg (324 mg Oral Given 01/18/19 0829)  fentaNYL (SUBLIMAZE) injection 25 mcg (25 mcg Intravenous Given 01/18/19 0829)    Mobility walks Low fall risk   Focused Assessments Cardiac Assessment Handoff:  Cardiac Rhythm: Atrial fibrillation Lab Results  Component Value Date   CKTOTAL 42 06/23/2014   CKMB 1.2 06/23/2014   TROPONINI 0.07 ([facility]) 08/25/2018   No results found for: DDIMER Does the Patient currently have chest pain? No     R Recommendations: See Admitting Provider Note  Report given to:   Additional Notes: Dialysis patient; received  half of treatment today prior to arrival here.  Per Nephrology, patient to receive full treatment tomorrow post stress test.  Fistula located left arm.

## 2019-01-18 NOTE — ED Provider Notes (Signed)
[facility] Emergency Department Provider Note  ____________________________________________   First MD Initiated Contact with Patient 01/18/19 [phone]     (approximate)  I have reviewed the triage vital signs and the nursing notes.  HISTORY  Chief Complaint Chest Pain    HPI [name] is a 58 y.o. female with history of ESRD on dialysis, CAD, status post CABG, CHF, paroxysmal A. fib on Eliquis, who presents to the emergency department for chest pain.  Localizes it to the center of her chest.  Describes it as sharp and pressure.  It started while getting dialysis, and has been present for approximately 1 hour.  She states it does feel like her prior heart attacks.  She has some associated shortness of breath, but no diaphoresis or nausea.  She did have a cardiac catheterization in February 2020 which showed patent grafts.  However, over the last several weeks she has had increasing episodes of chest pain and is reportedly scheduled for stress test tomorrow.         Past Medical Hx Past Medical History:  Diagnosis Date  . Anemia    chronic disease  . CHF (congestive heart failure) ([place])   . Coronary artery disease   . Heart failure ([place])   . Hepatitis    history of hep c  . Hyperlipidemia   . Hypertension   . Myocardial infarction ([place])   . Paroxysmal atrial fibrillation (HCC)   . Renal failure   . Renal insufficiency   . Stroke [facility]) 2011    Problem List Patient Active Problem List   Diagnosis Date Noted  . Non-STEMI (non-ST elevated myocardial infarction) ([place]) 08/07/2018  . Pneumonia 01/31/2018  . Swelling of limb 09/22/2016  . Kidney dialysis as the cause of abnormal reaction of the patient, or of later complication, without mention of misadventure at the time of the procedure (CODE) 03/24/2016  . Hyperkalemia 11/13/2014  . Generalized weakness 10/15/2014  . Chest pain 10/15/2014  . ESRD on hemodialysis ([place]) 10/15/2014  .  Congestive heart failure (CHF) ([place]) 10/15/2014  . HTN (hypertension) 10/15/2014  . Sternal wound dehiscence 04/12/2014  . 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 Pericardial effusion 04/12/2014  . History of delirium 04/12/2014  . H/O atrial flutter 04/12/2014  . Delayed surgical wound healing 04/12/2014  . Chest wall pain following surgery 04/12/2014  . S/P CABG (coronary artery bypass graft) 04/02/2014  . Lactic acidosis 03/26/2014  . Arteriosclerotic dementia ([place]) 03/25/2014  . Atrial flutter by electrocardiogram ([place]) 03/22/2014  . Superficial incisional surgical site infection 03/14/2014  . Postoperative anemia due to acute blood loss 02/27/2014  . Obesity 02/27/2014  . H/O: substance abuse ([place]) 02/27/2014  . Anemia of chronic renal failure 02/27/2014  . Acute postoperative pain 02/27/2014  . Leukocytosis 02/26/2014  . Hypotension 02/24/2014  . Exhausted vascular access 02/24/2014  . Anemia of chronic disease 02/24/2014  . Stroke ([place]) 02/21/2014  . S/P CABG x 3 02/21/2014  . Dialysis patient ([place]) 02/21/2014  . Hyperlipidemia 02/13/2014  . History of hepatitis C 02/13/2014  . HFrEF (heart failure with reduced ejection fraction) ([place]) 02/13/2014  . H/O stroke without residual deficits 02/13/2014  . ESRD (end stage renal disease) on dialysis ([place]) 02/13/2014  . End stage renal disease with hypertension ([place]) 02/13/2014  . CAD, multiple vessel 02/13/2014  . Retrosternal chest pain 05/30/2013  . Gallstones 05/29/2013    Past Surgical Hx Past Surgical History:  Procedure Laterality Date  . CORONARY ANGIOPLASTY WITH  STENT PLACEMENT  2013  . CORONARY ARTERY BYPASS GRAFT    . DIALYSIS FISTULA CREATION    . LEFT HEART CATH AND CORS/GRAFTS ANGIOGRAPHY N/A 08/08/2018   Procedure: LEFT HEART CATH AND CORS/GRAFTS ANGIOGRAPHY;  Surgeon: Dionisio David, MD;  Location: [facility];  Service: Cardiovascular;  Laterality: N/A;    Medications Prior to Admission  medications   Medication Sig Start Date End Date Taking? Authorizing Provider  aspirin 81 MG chewable tablet Chew 81 mg by mouth daily.    [provider]  cinacalcet (SENSIPAR) 60 MG tablet Take 60 mg by mouth every evening.     [provider]  diclofenac sodium (VOLTAREN) 1 % GEL Apply 4 g topically 4 (four) times daily. 12/22/18   Schuman, Stefanie Libel, MD  digoxin (LANOXIN) 0.125 MG tablet Take 0.0625 mg by mouth daily. 06/17/18   [provider]  ELIQUIS 5 MG TABS tablet Take 1 tablet (5 mg total) by mouth 2 (two) times daily. 02/02/18   Mayo, Pete Pelt, MD  hydrOXYzine (ATARAX/VISTARIL) 50 MG tablet Take 50 mg by mouth 2 (two) times daily as needed for itching.     [provider]  isosorbide mononitrate (IMDUR) 60 MG 24 hr tablet Take by mouth. 09/14/18   [provider]  levothyroxine (SYNTHROID) 75 MCG tablet TAKE 1 TABLET BY MOUTH ONCE DAILY IN THE MORNING 11/02/18   [provider]  midodrine (PROAMATINE) 10 MG tablet Take 10-20 mg by mouth daily. (for low BP associated with dialysis)    [provider]  oxyCODONE-acetaminophen (PERCOCET/ROXICET) 5-325 MG tablet TAKE 1 TABLET BY MOUTH UP TO TWICE DAILY AS NEEDED FOR PAIN 12/08/18   [provider]  pantoprazole (PROTONIX) 40 MG tablet Take 40 mg by mouth daily.    [provider]  ranolazine (RANEXA) 500 MG 12 hr tablet Take 1 tablet (500 mg total) by mouth 2 (two) times daily. 08/25/18   Gladstone Lighter, MD  sacubitril-valsartan (ENTRESTO) 49-51 MG Take by mouth. 08/01/18   [provider]  sucralfate (CARAFATE) 1 g tablet Take 1 g by mouth 2 (two) times daily. 07/07/18   [provider]    Allergies Morphine and related, Levaquin [levofloxacin], Latex, and Tape  Family Hx Family History  Problem Relation Age of Onset  . Hypertension Other   . Cancer Other   . Renal Disease Other   . Ovarian cancer Mother   . Kidney disease Father   .  Breast cancer Maternal Aunt   . Breast cancer Maternal Grandmother     Social Hx Social History   Tobacco Use  . Smoking status: Current Some Day Smoker    Packs/day: 0.10    Years: 20.00    Pack years: 2.00    Types: Cigarettes  . Smokeless tobacco: Never Used  Substance Use Topics  . Alcohol use: No  . Drug use: No    Review of Systems  Constitutional: Negative for fever. Negative for chills. Eyes: Negative for visual changes. ENT: Negative for sore throat. Cardiovascular: Positive for chest pain. Respiratory: Negative for shortness of breath. Gastrointestinal: Negative for abdominal pain. Negative for nausea. Negative for vomiting. Genitourinary: Negative for dysuria. Musculoskeletal: Negative for leg swelling. Skin: Negative for rash. Neurological: Negative for for headaches.  ____________________________________________   PHYSICAL EXAM:  Vital Signs: ED Triage Vitals  Enc Vitals Group     BP 01/18/19 0814 112/83     Pulse Rate 01/18/19 0814 62  Resp 01/18/19 0814 19     Temp 01/18/19 0814 98.2 F (36.8 C)     Temp Source 01/18/19 0814 Oral     SpO2 01/18/19 0814 100 %     Weight 01/18/19 0812 155 lb (70.3 kg)     Height 01/18/19 0812 5\' 6"  (1.676 m)     Head Circumference --      Peak Flow --      Pain Score 01/18/19 0812 10     Pain Loc --      Pain Edu? --      Excl. in [place]? --      Constitutional: Alert and oriented.  Eyes: Conjunctivae are normal. Sclera anicteric. Head: Normocephalic. Atraumatic. Nose: No congestion. No rhinorrhea. Mouth/Throat: Mucous membranes are moist.  Neck: No stridor.   Cardiovascular: Normal rate, regular rhythm. Grossly normal heart sounds. Extremities well perfused. Respiratory: Normal respiratory effort.  Lungs CTAB. Gastrointestinal: Soft and non-tender. No distention.  Musculoskeletal: AV fistula in left upper extremity. Neurologic:  Normal speech and language. No gross focal neurologic deficits are  appreciated.  Skin: Skin is warm, dry and intact. No rash noted. Psychiatric: Mood and affect are appropriate for situation.  ____________________________________________  EKG  Rate approximately 60s.  Underlying flutter, which appears new.  Normal axis.  Right bundle branch block.  PVCs.  No STEMI.   ____________________________________________   PROCEDURES  Procedure(s) performed (including Critical Care):  Procedures   ____________________________________________   INITIAL IMPRESSION / ASSESSMENT AND PLAN / ED COURSE  58 y.o. female with history of ESRD on HD, CAD status post CABG, paroxysmal atrial fibrillation on Eliquis, who presents to the ED for central chest pain during dialysis.  Given her significant heart history, concern for ACS, unstable angina.  Plan: labs, EKG, 324 mg aspirin.  Work-up reveals mildly elevated high-sensitivity troponin.  No priors to compare to, and slightly difficult to interpret in the setting of her ESRD.  However, repeat troponin after 2-hour delta remains elevated.  Discussed with hospitalist for admission, especially given she reportedly had plans for a stress test this week in the setting of increased episodes of chest pain.  She states her pain is improved after symptomatic treatment.  ____________________________________________   FINAL CLINICAL IMPRESSION(S) / ED DIAGNOSES  Final diagnoses:  Chest pain in adult      Note:  This document was prepared using Dragon voice recognition software and may include unintentional dictation errors.   Lilia Pro., MD 01/18/19 [phone]

## 2019-01-18 NOTE — ED Notes (Signed)
Nephrology to bedside.  

## 2019-01-18 NOTE — Consult Note (Signed)
Cardiology Consultation Note    Patient ID: [name], MRN: [id number], DOB/AGE: 11-08-1960 58 y.o. Admit date: 01/18/2019   Date of Consult: 01/18/2019 Primary Physician: [name], MD Primary Cardiologist: Dr. Neoma Laming  Chief Complaint: chest pain Reason for Consultation: chest pain Requesting MD: Dr. Jeralyn Ruths  HPI: [name] is a 58 y.o. female with history of coronary artery disease status post coronary artery bypass grafting with a LIMA to the LAD, RIMA to the OM and a saphenous vein graft to the PDA.  Native vessels are occluded.  She also has a history of end-stage renal disease on hemodialysis.  She is being seen by [place] currently for Dr. Humphrey Rolls.  Patient reports an ejection fraction in the 20s per echo done in her cardiologist office.  These data are not currently available to me.  She has been having recurrent chest pain as an outpatient.  She was set up for a functional study tomorrow as an outpatient however presented to the emergency room with increasing chest pain during hemodialysis today.  She stated it felt like she did when she had her myocardial infarction.  When I was evaluating her pain can be reproduced by deep palpation.  She has ruled out for myocardial infarction thus far as her serum troponin high-sensitivity of 30.  EKG revealed probable atrial tachycardia with a ventricular rate of 64 with 41 block.  She is hemodynamically stable.  There is no clinical evidence of heart failure.  Past Medical History:  Diagnosis Date  . Anemia    chronic disease  . CHF (congestive heart failure) ([place])   . Coronary artery disease   . Heart failure ([place])   . Hepatitis    history of hep c  . Hyperlipidemia   . Hypertension   . Myocardial infarction ([place])   . Paroxysmal atrial fibrillation (HCC)   . Renal failure   . Renal insufficiency   . Stroke [facility]) 2011      Surgical History:  Past Surgical History:  Procedure Laterality Date  . CORONARY ANGIOPLASTY  WITH STENT PLACEMENT  2013  . CORONARY ARTERY BYPASS GRAFT    . DIALYSIS FISTULA CREATION    . LEFT HEART CATH AND CORS/GRAFTS ANGIOGRAPHY N/A 08/08/2018   Procedure: LEFT HEART CATH AND CORS/GRAFTS ANGIOGRAPHY;  Surgeon: Dionisio David, MD;  Location: [place];  Service: Cardiovascular;  Laterality: N/A;     Home Meds: Prior to Admission medications   Medication Sig Start Date End Date Taking? Authorizing Provider  aspirin 81 MG chewable tablet Chew 81 mg by mouth daily.    [provider]  cinacalcet (SENSIPAR) 60 MG tablet Take 60 mg by mouth every evening.     [provider]  diclofenac sodium (VOLTAREN) 1 % GEL Apply 4 g topically 4 (four) times daily. 12/22/18   Schuman, Stefanie Libel, MD  digoxin (LANOXIN) 0.125 MG tablet Take 0.0625 mg by mouth daily. 06/17/18   [provider]  ELIQUIS 5 MG TABS tablet Take 1 tablet (5 mg total) by mouth 2 (two) times daily. 02/02/18   Mayo, Pete Pelt, MD  hydrOXYzine (ATARAX/VISTARIL) 50 MG tablet Take 50 mg by mouth 2 (two) times daily as needed for itching.     [provider]  isosorbide mononitrate (IMDUR) 60 MG 24 hr tablet Take by mouth. 09/14/18   [provider]  levothyroxine (SYNTHROID) 75 MCG tablet TAKE 1 TABLET BY MOUTH ONCE DAILY IN THE MORNING 11/02/18  [provider]  midodrine (PROAMATINE) 10 MG tablet Take 10-20 mg by mouth daily. (for low BP associated with dialysis)    [provider]  oxyCODONE-acetaminophen (PERCOCET/ROXICET) 5-325 MG tablet TAKE 1 TABLET BY MOUTH UP TO TWICE DAILY AS NEEDED FOR PAIN 12/08/18   [provider]  pantoprazole (PROTONIX) 40 MG tablet Take 40 mg by mouth daily.    [provider]  ranolazine (RANEXA) 500 MG 12 hr tablet Take 1 tablet (500 mg total) by mouth 2 (two) times daily. 08/25/18   Gladstone Lighter, MD  sacubitril-valsartan (ENTRESTO) 49-51 MG Take by mouth. 08/01/18   [provider]  sucralfate  (CARAFATE) 1 g tablet Take 1 g by mouth 2 (two) times daily. 07/07/18   [provider]    Inpatient Medications:  . apixaban  5 mg Oral BID  . aspirin  81 mg Oral Daily  . cinacalcet  60 mg Oral QPM  . digoxin  0.0625 mg Oral Daily  . isosorbide mononitrate  60 mg Oral Daily  . [START ON 01/19/2019] levothyroxine  75 mcg Oral Q0600  . pantoprazole  40 mg Oral Daily  . ranolazine  500 mg Oral BID  . sucralfate  1 g Oral BID     Allergies:  Allergies  Allergen Reactions  . Morphine And Related Hives  . Levaquin [Levofloxacin] Itching    Severe itching; prickly sensation  . Latex Rash  . Tape Rash    Social History   Socioeconomic History  . Marital status: Single    Spouse name: Not on file  . Number of children: Not on file  . Years of education: Not on file  . Highest education level: Not on file  Occupational History  . Not on file  Social Needs  . Financial resource strain: Not on file  . Food insecurity    Worry: Not on file    Inability: Not on file  . Transportation needs    Medical: Not on file    Non-medical: Not on file  Tobacco Use  . Smoking status: Current Some Day Smoker    Packs/day: 0.10    Years: 20.00    Pack years: 2.00    Types: Cigarettes  . Smokeless tobacco: Never Used  Substance and Sexual Activity  . Alcohol use: No  . Drug use: No  . Sexual activity: Not Currently    Birth control/protection: Post-menopausal  Lifestyle  . Physical activity    Days per week: Not on file    Minutes per session: Not on file  . Stress: Not on file  Relationships  . Social Herbalist on phone: Not on file    Gets together: Not on file    Attends religious service: Not on file    Active member of club or organization: Not on file    Attends meetings of clubs or organizations: Not on file    Relationship status: Not on file  . Intimate partner violence    Fear of current or ex partner: Not on file    Emotionally abused: Not on  file    Physically abused: Not on file    Forced sexual activity: Not on file  Other Topics Concern  . Not on file  Social History Narrative  . Not on file     Family History  Problem Relation Age of Onset  . Hypertension Other   . Cancer Other   . Renal Disease Other   .  Ovarian cancer Mother   . Kidney disease Father   . Breast cancer Maternal Aunt   . Breast cancer Maternal Grandmother      Review of Systems: A 12-system review of systems was performed and is negative except as noted in the HPI.  Labs: No results for input(s): CKTOTAL, CKMB, TROPONINI in the last 72 hours. Lab Results  Component Value Date   WBC 3.4 (L) 01/18/2019   HGB 11.6 (L) 01/18/2019   HCT 35.9 (L) 01/18/2019   MCV 99.4 01/18/2019   PLT 163 01/18/2019    Recent Labs  Lab 01/18/19 0816  NA 138  K 3.8  CL 99  CO2 27  BUN 30*  CREATININE 4.85*  CALCIUM 8.6*  PROT 8.0  BILITOT 0.7  ALKPHOS 132*  ALT 9  AST 17  GLUCOSE 92   Lab Results  Component Value Date   CHOL 79 06/14/2014   HDL 35 (L) 06/14/2014   LDLCALC 27 06/14/2014   TRIG 85 06/14/2014   No results found for: DDIMER  Radiology/Studies:  Ct Virtual Colonoscopy Diagnostic  Result Date: 12/20/2018 CLINICAL DATA:  Bright red blood in stool x2 months EXAM: CT VIRTUAL COLONOSCOPY DIAGNOSTIC TECHNIQUE: The patient was given a standard Mag citrate bowel preparation with Gastrografin and barium for fluid and stool tagging respectively. The quality of the bowel preparation is moderate. Automated CO2 insufflation of the colon was performed prior to image acquisition and colonic distention is moderate. Image post processing was used to generate a 3D endoluminal fly-through projection of the colon and to electronically subtract stool/fluid as appropriate. COMPARISON:  None. FINDINGS: VIRTUAL COLONOSCOPY Moderate layering fluid/contrast within the colon, which shifts position on supine, prone, and right lateral decubitus imaging. Sigmoid  colon is underdistended on supine imaging, but better visualized on prone and right lateral decubitus imaging. 14 mm flat polypoid lesion in the right lateral aspect of the upper/mid rectum, approximately 12 cm from the anal verge. This lesion is only visualized on the right lateral decubitus images (series 20/image 117) and as such layering debris is possible, but a carpet lesion could have this appearance. Otherwise, no significant colonic polyp, mass, apple core lesion, or stricture. No evidence of bowel obstruction. Normal appendix (series 7/image 89). Virtual colonoscopy is not designed to detect diminutive polyps (i.e., less than or equal to 5 mm), the presence or absence of which may not affect clinical management. CT ABDOMEN AND PELVIS WITHOUT CONTRAST Lower chest: Mild subpleural reticulation/fibrosis at the lung bases. Hepatobiliary: Cirrhosis. No focal hepatic lesion is evident unenhanced CT. Cholelithiasis with layering gallbladder sludge. No associated inflammatory changes. No intrahepatic or extrahepatic ductal dilatation. Pancreas: Within normal limits. Spleen: Spleen is normal in size. Adrenals/Urinary Tract: Adrenal glands are within normal limits. Bilateral renal atrophy. 18 mm interpolar left renal cyst (series 7/image 44). No renal calculi or hydronephrosis. Bladder is within normal limits. Stomach/Bowel: Stomach is within normal limits. Visualized bowel is described above. Vascular/Lymphatic: No evidence of abdominal aortic aneurysm. Atherosclerotic calcifications the abdominal aorta and branch vessels. No suspicious abdominopelvic lymphadenopathy. Reproductive: Calcified uterine fibroids (series 7/image 93). Bilateral ovaries are unremarkable. Other: Moderate abdominopelvic ascites. Musculoskeletal: Mild osseous sclerosis, nonspecific. Correlate for renal osteodystrophy. IMPRESSION: Possible 14 mm flat polypoid lesion along the right lateral aspect of the upper/mid rectum, proximally 12 cm from  the anal verge, equivocal. Flexible sigmoidoscopy/colonoscopy is suggested for direct visualization. Cirrhosis.  Cholelithiasis.  Moderate abdominopelvic ascites. Additional ancillary findings as above. Electronically Signed   By: Henderson Newcomer.D.  On: 12/20/2018 10:43   Dg Chest Port 1 View  Result Date: 01/18/2019 CLINICAL DATA:  Chest pain. EXAM: PORTABLE CHEST 1 VIEW COMPARISON:  August 24, 2018 FINDINGS: Postsurgical changes from CABG. Enlarged cardiac silhouette. Mediastinal contours appear intact. Mild pulmonary vascular congestion. Osseous structures are without acute abnormality. Soft tissues are grossly normal. IMPRESSION: Markedly enlarged cardiac silhouette. Mild pulmonary vascular congestion. Electronically Signed   By: Fidela Salisbury M.D.   On: 01/18/2019 08:46    Wt Readings from Last 3 Encounters:  01/18/19 70.3 kg  12/22/18 71.2 kg  08/25/18 77.2 kg    EKG: Probable atrial tachycardia with 4-1 block.  Physical Exam:  Blood pressure 108/72, pulse (!) 55, temperature (!) 97.5 F (36.4 C), temperature source Oral, resp. rate 19, height 5\' 6"  (1.676 m), weight 70.3 kg, SpO2 100 %. Body mass index is 25.02 kg/m. General: Well developed, well nourished, in no acute distress. Head: Normocephalic, atraumatic, sclera non-icteric, no xanthomas, nares are without discharge.  Neck: Negative for carotid bruits. JVD not elevated. Lungs: Clear bilaterally to auscultation without wheezes, rales, or rhonchi. Breathing is unlabored. Heart: RRR with S1 S2. No murmurs, rubs, or gallops appreciated. Abdomen: Soft, non-tender, non-distended with normoactive bowel sounds. No hepatomegaly. No rebound/guarding. No obvious abdominal masses. Msk:  Strength and tone appear normal for age. Extremities: No clubbing or cyanosis. No edema.  Distal pedal pulses are 2+ and equal bilaterally. Neuro: Alert and oriented X 3. No facial asymmetry. No focal deficit. Moves all extremities  spontaneously. Psych:  Responds to questions appropriately with a normal affect.     Assessment and Plan  58 year old female with history of ischemic cardiomyopathy with coronary bypass grafting x3 with patent grafts by cath earlier this year.  Now has chest pain with atypical features.  Worse with deep palpation.  Has thus far ruled out for myocardial infarction.  EKG shows no ischemia but does appear to suggest an atrial tachycardia which is new.  Patient is hemodynamically stable and has 4-1 block.  We will proceed with a functional study tomorrow as an inpatient as it was scheduled as an outpatient make further recommendations after this is completed.  Will transfer care to Dr. Humphrey Rolls when available. Signed, Teodoro Spray MD 01/18/2019, 1:53 PM Pager: [phone]

## 2019-01-19 ENCOUNTER — Encounter: Payer: Self-pay | Admitting: Radiology

## 2019-01-19 DIAGNOSIS — I25110 Atherosclerotic heart disease of native coronary artery with unstable angina pectoris: Secondary | ICD-10-CM | POA: Diagnosis not present

## 2019-01-19 LAB — CBC
HCT: 34.3 % — ABNORMAL LOW (ref 36.0–46.0)
Hemoglobin: 11.2 g/dL — ABNORMAL LOW (ref 12.0–15.0)
MCH: 31.7 pg (ref 26.0–34.0)
MCHC: 32.7 g/dL (ref 30.0–36.0)
MCV: 97.2 fL (ref 80.0–100.0)
Platelets: 149 K/uL — ABNORMAL LOW (ref 150–400)
RBC: 3.53 MIL/uL — ABNORMAL LOW (ref 3.87–5.11)
RDW: 15.7 % — ABNORMAL HIGH (ref 11.5–15.5)
WBC: 3.5 K/uL — ABNORMAL LOW (ref 4.0–10.5)
nRBC: 0 % (ref 0.0–0.2)

## 2019-01-19 LAB — RENAL FUNCTION PANEL
Albumin: 4.1 g/dL (ref 3.5–5.0)
Anion gap: 11 (ref 5–15)
BUN: 27 mg/dL — ABNORMAL HIGH (ref 6–20)
CO2: 26 mmol/L (ref 22–32)
Calcium: 8.7 mg/dL — ABNORMAL LOW (ref 8.9–10.3)
Chloride: 101 mmol/L (ref 98–111)
Creatinine, Ser: 4.03 mg/dL — ABNORMAL HIGH (ref 0.44–1.00)
GFR calc Af Amer: 14 mL/min — ABNORMAL LOW (ref >60)
GFR calc non Af Amer: 12 mL/min — ABNORMAL LOW (ref >60)
Glucose, Bld: 95 mg/dL (ref 70–99)
Phosphorus: 1.9 mg/dL — ABNORMAL LOW (ref 2.5–4.6)
Potassium: 4.3 mmol/L (ref 3.5–5.1)
Sodium: 138 mmol/L (ref 135–145)

## 2019-01-19 LAB — HEPATITIS B CORE ANTIBODY, IGM: Hep B C IgM: NEGATIVE

## 2019-01-19 LAB — SARS CORONAVIRUS 2 (TAT 6-24 HRS): SARS Coronavirus 2: NEGATIVE

## 2019-01-19 LAB — HEPATITIS B SURFACE ANTIGEN: Hepatitis B Surface Ag: NEGATIVE

## 2019-01-19 LAB — HEPATITIS B SURFACE ANTIBODY,QUALITATIVE: Hep B S Ab: NONREACTIVE

## 2019-01-19 MED ORDER — TECHNETIUM TC 99M TETROFOSMIN IV KIT
10.5270 | PACK | Freq: Once | INTRAVENOUS | Status: AC | PRN
  Administered 2019-01-19: 10:00:00 10.527 via INTRAVENOUS

## 2019-01-19 MED ORDER — REGADENOSON 0.4 MG/5ML IV SOLN
0.4000 mg | Freq: Once | INTRAVENOUS | Status: AC
  Administered 2019-01-19: 0.4 mg via INTRAVENOUS
  Filled 2019-01-19: qty 5

## 2019-01-19 MED ORDER — TECHNETIUM TC 99M TETROFOSMIN IV KIT
29.0760 | PACK | Freq: Once | INTRAVENOUS | Status: AC | PRN
  Administered 2019-01-19: 29.076 via INTRAVENOUS

## 2019-01-19 NOTE — Progress Notes (Signed)
Established hemodialysis patient known at [facility] MWF 5:45, patient rides [employer] transportation. Please note any changes in COVID and mobility status may affect this plan. Please contact me directly with any dialysis placement concerns.  Elvera Bicker Dialysis Coordinator [phone]

## 2019-01-19 NOTE — Progress Notes (Addendum)
Pre Dialysis Tx   Please refer to Note charted at 1721. Thanks. [name]

## 2019-01-19 NOTE — Progress Notes (Signed)
SUBJECTIVE: Patient still has intermittent chest pain   Vitals:   01/18/19 1940 01/18/19 2018 01/19/19 0428 01/19/19 0737  BP:  105/61 96/66 (!) 118/97  Pulse:  64 86 86  Resp:  18 20   Temp:  98.1 F (36.7 C) 98.8 F (37.1 C) 98.4 F (36.9 C)  TempSrc:  Oral Oral Oral  SpO2: 100% 100% 100% 100%  Weight:      Height:        Intake/Output Summary (Last 24 hours) at 01/19/2019 1136 Last data filed at 01/19/2019 0225 Gross per 24 hour  Intake 240 ml  Output 0 ml  Net 240 ml    LABS: Basic Metabolic Panel: Recent Labs    01/18/19 0816  NA 138  K 3.8  CL 99  CO2 27  GLUCOSE 92  BUN 30*  CREATININE 4.85*  CALCIUM 8.6*  MG 2.1  PHOS 2.2*   Liver Function Tests: Recent Labs    01/18/19 0816  AST 17  ALT 9  ALKPHOS 132*  BILITOT 0.7  PROT 8.0  ALBUMIN 4.2   No results for input(s): LIPASE, AMYLASE in the last 72 hours. CBC: Recent Labs    01/18/19 0816  WBC 3.4*  NEUTROABS 1.7  HGB 11.6*  HCT 35.9*  MCV 99.4  PLT 163   Cardiac Enzymes: No results for input(s): CKTOTAL, CKMB, CKMBINDEX, TROPONINI in the last 72 hours. BNP: Invalid input(s): POCBNP D-Dimer: No results for input(s): DDIMER in the last 72 hours. Hemoglobin A1C: No results for input(s): HGBA1C in the last 72 hours. Fasting Lipid Panel: Recent Labs    01/18/19 1358  CHOL 140  HDL 43  LDLCALC 67  TRIG 148  CHOLHDL 3.3   Thyroid Function Tests: No results for input(s): TSH, T4TOTAL, T3FREE, THYROIDAB in the last 72 hours.  Invalid input(s): FREET3 Anemia Panel: No results for input(s): VITAMINB12, FOLATE, FERRITIN, TIBC, IRON, RETICCTPCT in the last 72 hours.   PHYSICAL EXAM General: Well developed, well nourished, in no acute distress HEENT:  Normocephalic and atramatic Neck:  No JVD.  Lungs: Clear bilaterally to auscultation and percussion. Heart: HRRR . Normal S1 and S2 without gallops or murmurs.  Abdomen: Bowel sounds are positive, abdomen soft and non-tender  Msk:   Back normal, normal gait. Normal strength and tone for age. Extremities: No clubbing, cyanosis or edema.   Neuro: Alert and oriented X 3. Psych:  Good affect, responds appropriately  TELEMETRY: Atrial fibrillation  ASSESSMENT AND PLAN: Chest pain with frequent PVCs and underlying rhythm atrial fibrillation.  Patient had a cardiac catheterization in February this year which showed all bypass grafts were patent.  Patient had chest pain during dialysis.  Patient already on maximum medical therapy and had Lexiscan Myoview this morning.  May increase the dosage of isosorbide to 60 twice daily.  Can be discharged with follow-up tomorrow at 10:00 and I will go over the stress test results with her at that time. Active Problems:   Chest pain    Brendi Mccarroll A, MD, [facility] 01/19/2019 11:36 AM

## 2019-01-19 NOTE — Discharge Instructions (Signed)
Nonspecific Chest Pain, Adult Chest pain can be caused by many different conditions. It can be caused by a condition that is life-threatening and requires treatment right away. It can also be caused by something that is not life-threatening. If you have chest pain, it can be hard to know the difference, so it is important to get help right away to make sure that you do not have a serious condition. Some life-threatening causes of chest pain include:  Heart attack.  A tear in the body's main blood vessel (aortic dissection).  Inflammation around your heart (pericarditis).  A problem in the lungs, such as a blood clot (pulmonary embolism) or a collapsed lung (pneumothorax). Some non life-threatening causes of chest pain include:  Heartburn.  Anxiety or stress.  Damage to the bones, muscles, and cartilage that make up your chest wall.  Pneumonia or bronchitis.  Shingles infection (varicella-zoster virus). Chest pain can feel like:  Pain or discomfort on the surface of your chest or deep in your chest.  Crushing, pressure, aching, or squeezing pain.  Burning or tingling.  Dull or sharp pain that is worse when you move, cough, or take a deep breath.  Pain or discomfort that is also felt in your back, neck, jaw, shoulder, or arm, or pain that spreads to any of these areas. Your chest pain may come and go. It may also be constant. Your health care provider will do lab tests and other studies to find the cause of your pain. Treatment will depend on the cause of your chest pain. Follow these instructions at home: Medicines  Take over-the-counter and prescription medicines only as told by your health care provider.  If you were prescribed an antibiotic, take it as told by your health care provider. Do not stop taking the antibiotic even if you start to feel better. Lifestyle   Rest as directed by your health care provider.  Do not use any products that contain nicotine or tobacco,  such as cigarettes and e-cigarettes. If you need help quitting, ask your health care provider.  Do not drink alcohol.  Make healthy lifestyle choices as recommended. These may include: ? Getting regular exercise. Ask your health care provider to suggest some activities that are safe for you. ? Eating a heart-healthy diet. This includes plenty of fresh fruits and vegetables, whole grains, low-fat (lean) protein, and low-fat dairy products. A dietitian can help you find healthy eating options. ? Maintaining a healthy weight. ? Managing any other health conditions you have, such as high blood pressure (hypertension) or diabetes. ? Reducing stress, such as with yoga or relaxation techniques. General instructions  Pay attention to any changes in your symptoms. Tell your health care provider about them or any new symptoms.  Avoid any activities that cause chest pain.  Keep all follow-up visits as told by your health care provider. This is important. This includes visits for any further testing if your chest pain does not go away. Contact a health care provider if:  Your chest pain does not go away.  You feel depressed.  You have a fever. Get help right away if:  Your chest pain gets worse.  You have a cough that gets worse, or you cough up blood.  You have severe pain in your abdomen.  You faint.  You have sudden, unexplained chest discomfort.  You have sudden, unexplained discomfort in your arms, back, neck, or jaw.  You have shortness of breath at any time.  You suddenly start   to sweat, or your skin gets clammy.  You feel nausea or you vomit.  You suddenly feel lightheaded or dizzy.  You have severe weakness, or unexplained weakness or fatigue.  Your heart begins to beat quickly, or it feels like it is skipping beats. These symptoms may represent a serious problem that is an emergency. Do not wait to see if the symptoms will go away. Get medical help right away. Call your  local emergency services (911 in the [place] Do not drive yourself to the hospital. Summary  Chest pain can be caused by a condition that is serious and requires urgent treatment. It may also be caused by something that is not life-threatening.  If you have chest pain, it is very important to see your health care provider. Your health care provider may do lab tests and other studies to find the cause of your pain.  Follow your health care provider's instructions on taking medicines, making lifestyle changes, and getting emergency treatment if symptoms become worse.  Keep all follow-up visits as told by your health care provider. This includes visits for any further testing if your chest pain does not go away. This information is not intended to replace advice given to you by your health care provider. Make sure you discuss any questions you have with your health care provider. Document Released: 03/11/2005 Document Revised: 12/02/2017 Document Reviewed: 12/02/2017 Elsevier Patient Education  2020 Elsevier Inc.  

## 2019-01-19 NOTE — Care Management Obs Status (Addendum)
[place] NOTIFICATION   Patient Details  Name: [name] MRN: [id number] Date of Birth: May 15, 1961   Medicare Observation Status Notification Given:  Yes    Ross Ludwig, LCSW 01/19/2019, 12:11 PM

## 2019-01-19 NOTE — Plan of Care (Signed)
Treated for chest pain once with norco  Problem: Activity: Goal: Risk for activity intolerance will decrease Outcome: Progressing Note: Independent in room tolerating well   Problem: Coping: Goal: Level of anxiety will decrease Outcome: Progressing   Problem: Education: Goal: Knowledge of General Education information will improve Description: Including pain rating scale, medication(s)/side effects and non-pharmacologic comfort measures Outcome: Completed/Met   Problem: Nutrition: Goal: Adequate nutrition will be maintained Outcome: Completed/Met   Problem: Elimination: Goal: Will not experience complications related to urinary retention Outcome: Not Applicable

## 2019-01-19 NOTE — Progress Notes (Signed)
Pt tolerated HD well. However, RN had to hold pressure on Fistula for prolonged bleeding 40 to 4min.

## 2019-01-19 NOTE — Progress Notes (Signed)
Initial Nutrition Assessment  DOCUMENTATION CODES:   Not applicable  INTERVENTION:   Nepro Shake po BID, each supplement provides 425 kcal and 19 grams protein  Rena-vite daily   NUTRITION DIAGNOSIS:   Increased nutrient needs related to chronic illness(ESRD on HD) as evidenced by increased estimated needs.  GOAL:   Patient will meet greater than or equal to 90% of their needs  MONITOR:   PO intake, Supplement acceptance, Labs, Weight trends, Skin  REASON FOR ASSESSMENT:   Malnutrition Screening Tool    ASSESSMENT:   58 y.o. female with a known history of ESRD on dialysis, CAD, status post CABG, CHF, paroxysmal A. fib on Eliquis being admitted for chest pain  RD working remotely.  RD familiar with this patient from multiple previous admits. Pt generally has fair appetite and oral intake while in hospital. RD will add supplements and vitamins to help pt meet her estimated needs and replace losses from HD. Per chart, pt with 31lb(17%) weight loss over the past year.   Medications reviewed and include: aspirin, synthroid, rena-vite, protonix, renvela, sucralfate   Labs reviewed: K 3.8 wnl, P 2.2(L), Mg 2.1 wnl BNP- 2515(H)- 8/5 Wbc- 3.4(L) iPTH- 414(H)- 8/19  Unable to complete Nutrition-Focused physical exam at this time.   Diet Order:   Diet Order            Diet NPO time specified  Diet effective midnight             EDUCATION NEEDS:   Education needs have been addressed  Skin:  Skin Assessment: Reviewed RN Assessment  Last BM:  8/4  Height:   Ht Readings from Last 1 Encounters:  01/18/19 5\' 6"  (1.676 m)    Weight:   Wt Readings from Last 1 Encounters:  01/18/19 70.3 kg    Ideal Body Weight:  59 kg  BMI:  Body mass index is 25.02 kg/m.  Estimated Nutritional Needs:   Kcal:  1700-2000kcal/day  Protein:  85-100g/day  Fluid:  UOP +1L  Koleen Distance MS, RD, LDN Pager #- [phone] Office#- [phone] After Hours Pager:  [phone]

## 2019-01-19 NOTE — Plan of Care (Signed)
  Problem: Education: Goal: Knowledge of disease and its progression will improve 01/19/2019 1434 by Newt Minion, RN Outcome: Progressing 01/19/2019 1433 by Newt Minion, RN Outcome: Progressing 01/19/2019 1433 by Newt Minion, RN Outcome: Progressing   Problem: Education: Goal: Individualized Educational Video(s) 01/19/2019 1433 by Newt Minion, RN Outcome: Progressing 01/19/2019 1433 by Newt Minion, RN Outcome: Progressing   Problem: Fluid Volume: Goal: Compliance with measures to maintain balanced fluid volume will improve 01/19/2019 1434 by Newt Minion, RN Outcome: Progressing 01/19/2019 1433 by Newt Minion, RN Outcome: Progressing 01/19/2019 1433 by Newt Minion, RN Outcome: Progressing   Problem: Clinical Measurements: Goal: Complications related to the disease process, condition or treatment will be avoided or minimized 01/19/2019 1434 by Newt Minion, RN Outcome: Progressing 01/19/2019 1433 by Newt Minion, RN Outcome: Progressing 01/19/2019 1433 by Newt Minion, RN Outcome: Progressing

## 2019-01-19 NOTE — Discharge Summary (Signed)
[facility] [place] NAME: [name]    MR#:  [id number]  DATE OF BIRTH:  06-25-1960  DATE OF ADMISSION:  01/18/2019   ADMITTING PHYSICIAN: Max Sane, MD  DATE OF DISCHARGE: 01/19/2019  PRIMARY CARE PHYSICIAN: [name], MD   ADMISSION DIAGNOSIS:  Chest pain in adult [R07.9] DISCHARGE DIAGNOSIS:  Active Problems:   Chest pain  SECONDARY DIAGNOSIS:   Past Medical History:  Diagnosis Date  . Anemia    chronic disease  . CHF (congestive heart failure) ([place])   . Coronary artery disease   . Heart failure ([place])   . Hepatitis    history of hep c  . Hyperlipidemia   . Hypertension   . Myocardial infarction ([place])   . Paroxysmal atrial fibrillation ([facility])   . Renal failure   . Renal insufficiency   . Stroke [facility]) 2011   HOSPITAL COURSE:  58 year old female with history of ischemic cardiomyopathy with coronary bypass grafting x3 with patent grafts by cath earlier this year in February admitted for chest pain  *Unstable angina:chest pain with atypical features.  -negative serial troponins.  -EKG shows no ischemia  - Neg Myoview   * End Stage Renal Disease: HD while in the Hospital  * Hypertension: remains low so not able to increase dose of imdur at this time.  * Anemia with chronic kidney disease: hemoglobin 11.6.  - EPO per Nephro at HD DISCHARGE CONDITIONS:  stable CONSULTS OBTAINED:  Treatment Team:  Dionisio David, MD Teodoro Spray, MD DRUG ALLERGIES:   Allergies  Allergen Reactions  . Morphine And Related Hives  . Levaquin [Levofloxacin] Itching    Severe itching; prickly sensation  . Latex Rash  . Tape Rash   DISCHARGE MEDICATIONS:   Allergies as of 01/19/2019      Reactions   Morphine And Related Hives   Levaquin [levofloxacin] Itching   Severe itching; prickly sensation   Latex Rash   Tape Rash      Medication List    TAKE these medications   amiodarone 200 MG tablet Commonly  known as: PACERONE Take 200 mg by mouth 2 (two) times daily.   aspirin 81 MG chewable tablet Chew 81 mg by mouth daily.   diclofenac sodium 1 % Gel Commonly known as: VOLTAREN Apply 4 g topically 4 (four) times daily.   Eliquis 5 MG Tabs tablet Generic drug: apixaban Take 1 tablet (5 mg total) by mouth 2 (two) times daily.   Entresto 49-51 MG Generic drug: sacubitril-valsartan Take 1 tablet by mouth 2 (two) times daily.   hydrOXYzine 50 MG tablet Commonly known as: ATARAX/VISTARIL Take 50 mg by mouth 2 (two) times daily as needed for itching.   isosorbide mononitrate 60 MG 24 hr tablet Commonly known as: IMDUR Take 60 mg by mouth daily.   levothyroxine 75 MCG tablet Commonly known as: SYNTHROID TAKE 1 TABLET BY MOUTH ONCE DAILY IN THE MORNING   midodrine 10 MG tablet Commonly known as: PROAMATINE Take 10-20 mg by mouth daily. (for low BP associated with dialysis)   oxyCODONE-acetaminophen 5-325 MG tablet Commonly known as: PERCOCET/ROXICET TAKE 1 TABLET BY MOUTH UP TO TWICE DAILY AS NEEDED FOR PAIN   pantoprazole 40 MG tablet Commonly known as: PROTONIX Take 40 mg by mouth daily.   Sensipar 60 MG tablet Generic drug: cinacalcet Take 60 mg by mouth every evening.   sevelamer carbonate 800 MG tablet Commonly known as: RENVELA Take  2,400 mg by mouth 3 (three) times daily with meals. And 1 tablet with snacks        DISCHARGE INSTRUCTIONS:   DIET:  Cardiac diet DISCHARGE CONDITION:  Good ACTIVITY:  Activity as tolerated OXYGEN:  Home Oxygen: No.  Oxygen Delivery: room air DISCHARGE LOCATION:  home   If you experience worsening of your admission symptoms, develop shortness of breath, life threatening emergency, suicidal or homicidal thoughts you must seek medical attention immediately by calling 911 or calling your MD immediately  if symptoms less severe.  You Must read complete instructions/literature along with all the possible adverse reactions/side  effects for all the Medicines you take and that have been prescribed to you. Take any new Medicines after you have completely understood and accpet all the possible adverse reactions/side effects.   Please note  You were cared for by a hospitalist during your hospital stay. If you have any questions about your discharge medications or the care you received while you were in the hospital after you are discharged, you can call the unit and asked to speak with the hospitalist on call if the hospitalist that took care of you is not available. Once you are discharged, your primary care physician will handle any further medical issues. Please note that NO REFILLS for any discharge medications will be authorized once you are discharged, as it is imperative that you return to your primary care physician (or establish a relationship with a primary care physician if you do not have one) for your aftercare needs so that they can reassess your need for medications and monitor your lab values.    On the day of Discharge:  VITAL SIGNS:  Blood pressure (!) 89/56, pulse 73, temperature 98.1 F (36.7 C), temperature source Oral, resp. rate 18, height 5\' 6"  (1.676 m), weight 73.7 kg, SpO2 100 %. PHYSICAL EXAMINATION:  GENERAL:  58 y.o.-year-old patient lying in the bed with no acute distress.  EYES: Pupils equal, round, reactive to light and accommodation. No scleral icterus. Extraocular muscles intact.  HEENT: Head atraumatic, normocephalic. Oropharynx and nasopharynx clear.  NECK:  Supple, no jugular venous distention. No thyroid enlargement, no tenderness.  LUNGS: Normal breath sounds bilaterally, no wheezing, rales,rhonchi or crepitation. No use of accessory muscles of respiration.  CARDIOVASCULAR: S1, S2 normal. No murmurs, rubs, or gallops.  ABDOMEN: Soft, non-tender, non-distended. Bowel sounds present. No organomegaly or mass.  EXTREMITIES: No pedal edema, cyanosis, or clubbing.  NEUROLOGIC: Cranial  nerves II through XII are intact. Muscle strength 5/5 in all extremities. Sensation intact. Gait not checked.  PSYCHIATRIC: The patient is alert and oriented x 3.  SKIN: No obvious rash, lesion, or ulcer.  DATA REVIEW:   CBC Recent Labs  Lab 01/19/19 1435  WBC 3.5*  HGB 11.2*  HCT 34.3*  PLT 149*    Chemistries  Recent Labs  Lab 01/18/19 0816 01/19/19 1435  NA 138 138  K 3.8 4.3  CL 99 101  CO2 27 26  GLUCOSE 92 95  BUN 30* 27*  CREATININE 4.85* 4.03*  CALCIUM 8.6* 8.7*  MG 2.1  --   AST 17  --   ALT 9  --   ALKPHOS 132*  --   BILITOT 0.7  --      Microbiology Results  Results for orders placed or performed during the hospital encounter of 01/18/19  SARS CORONAVIRUS 2 Nasal Swab Aptima Multi Swab     Status: None   Collection Time: 01/18/19 11:55 AM  Specimen: Aptima Multi Swab; Nasal Swab  Result Value Ref Range Status   SARS Coronavirus 2 NEGATIVE NEGATIVE Final    Comment: (NOTE) SARS-CoV-2 target nucleic acids are NOT DETECTED. The SARS-CoV-2 RNA is generally detectable in upper and lower respiratory specimens during the acute phase of infection. Negative results do not preclude SARS-CoV-2 infection, do not rule out co-infections with other pathogens, and should not be used as the sole basis for treatment or other patient management decisions. Negative results must be combined with clinical observations, patient history, and epidemiological information. The expected result is Negative. Fact Sheet for Patients: SugarRoll.be Fact Sheet for Healthcare Providers: https://www.woods-mathews.com/ This test is not yet approved or cleared by the [place] FDA and  has been authorized for detection and/or diagnosis of SARS-CoV-2 by FDA under an Emergency Use Authorization (EUA). This EUA will remain  in effect (meaning this test can be used) for the duration of the COVID-19 declaration under Section 56 4(b)(1) of the  Act, 21 U.S.C. section 360bbb-3(b)(1), unless the authorization is terminated or revoked sooner. Performed at [facility], [place] [address] [place], [place] [address]   MRSA PCR Screening     Status: Abnormal   Collection Time: 01/18/19  5:38 PM   Specimen: Nasopharyngeal  Result Value Ref Range Status   MRSA by PCR POSITIVE (A) NEGATIVE Final    Comment:        The GeneXpert MRSA Assay (FDA approved for NASAL specimens only), is one component of a comprehensive MRSA colonization surveillance program. It is not intended to diagnose MRSA infection nor to guide or monitor treatment for MRSA infections. CRITICAL RESULT CALLED TO, READ BACK BY AND VERIFIED WITH: Malcolm Metro RN AT 2113 ON 01/18/2019 [facility]  Performed at [facility], [address], [place], [place] [address]     RADIOLOGY:  No results found.   Management plans discussed with the patient, family and they are in agreement.  CODE STATUS: Full Code   TOTAL TIME TAKING CARE OF THIS PATIENT: 45 minutes.    Max Sane M.D on 01/19/2019 at 3:24 PM  Between 7am to 6pm - Pager - [phone]  After 6pm go to [email or web address] - Proofreader  [facility]  Office  [phone]  CC: Primary care physician; [name], MD   Note: This dictation was prepared with Dragon dictation along with smaller phrase technology. Any transcriptional errors that result from this process are unintentional.

## 2019-01-19 NOTE — Progress Notes (Signed)
Pre-HD Tx initiation Pt is stable and ready to receive Dialysis    01/19/19 1305  Hand-Off documentation  Report given to (Full Name) Oran Rein RN  Report received from (Full Name) Tammy RN   Vital Signs  Temp 98.1 F (36.7 C)  Temp Source Oral  Pulse Rate 73  Pulse Rate Source Monitor  Resp 18  BP 95/82  BP Location Right Arm  BP Method Automatic  Patient Position (if appropriate) Lying  Oxygen Therapy  SpO2 100 %  O2 Device Nasal Cannula  O2 Flow Rate (L/min) 2 L/min  Education / Care Plan  Dialysis Education Provided Yes

## 2019-01-19 NOTE — Progress Notes (Signed)
[facility]  ROUNDING NOTE   Subjective:   [name] brought to ED from hemodialysis clinic after one hour of hemodialysis treatment. Did stress test today. States chest pain has resolved  Patient seen during dialysis Tolerating well Wants to cut treatment to 2 hrs as she has HD tomorrow AM .   Objective:  Vital signs in last 24 hours:  Temp:  [98.1 F (36.7 C)-98.8 F (37.1 C)] 98.1 F (36.7 C) (08/06 1305) Pulse Rate:  [64-86] 73 (08/06 1305) Resp:  [18-20] 18 (08/06 1305) BP: (95-118)/(61-97) 95/82 (08/06 1305) SpO2:  [100 %] 100 % (08/06 1305) Weight:  [73.7 kg] 73.7 kg (08/06 1305)  Weight change:  Filed Weights   01/18/19 0812 01/19/19 1305  Weight: 70.3 kg 73.7 kg    Intake/Output: I/O last 3 completed shifts: In: 240 [P.O.:240] Out: 0    Intake/Output this shift:  Total I/O In: 240 [P.O.:240] Out: -   Physical Exam: General: NAD, sitting in stretcher  Head: Normocephalic, atraumatic. Moist oral mucosal membranes  Eyes: Anicteric,  Neck: Supple, trachea midline  Lungs:  Clear to auscultation  Heart: Irregular A Fib  Abdomen:  Soft, nontender,   Extremities:  no peripheral edema.  Neurologic: Alert, oreinted  Skin: No lesions  Access: Left AVG    Basic Metabolic Panel: Recent Labs  Lab 01/18/19 0816  NA 138  K 3.8  CL 99  CO2 27  GLUCOSE 92  BUN 30*  CREATININE 4.85*  CALCIUM 8.6*  MG 2.1  PHOS 2.2*    Liver Function Tests: Recent Labs  Lab 01/18/19 0816  AST 17  ALT 9  ALKPHOS 132*  BILITOT 0.7  PROT 8.0  ALBUMIN 4.2   No results for input(s): LIPASE, AMYLASE in the last 168 hours. No results for input(s): AMMONIA in the last 168 hours.  CBC: Recent Labs  Lab 01/18/19 0816  WBC 3.4*  NEUTROABS 1.7  HGB 11.6*  HCT 35.9*  MCV 99.4  PLT 163    Cardiac Enzymes: No results for input(s): CKTOTAL, CKMB, CKMBINDEX, TROPONINI in the last 168 hours.  BNP: Invalid input(s): POCBNP  CBG: No  results for input(s): GLUCAP in the last 168 hours.  Microbiology: Results for orders placed or performed during the hospital encounter of 01/18/19  SARS CORONAVIRUS 2 Nasal Swab Aptima Multi Swab     Status: None   Collection Time: 01/18/19 11:55 AM   Specimen: Aptima Multi Swab; Nasal Swab  Result Value Ref Range Status   SARS Coronavirus 2 NEGATIVE NEGATIVE Final    Comment: (NOTE) SARS-CoV-2 target nucleic acids are NOT DETECTED. The SARS-CoV-2 RNA is generally detectable in upper and lower respiratory specimens during the acute phase of infection. Negative results do not preclude SARS-CoV-2 infection, do not rule out co-infections with other pathogens, and should not be used as the sole basis for treatment or other patient management decisions. Negative results must be combined with clinical observations, patient history, and epidemiological information. The expected result is Negative. Fact Sheet for Patients: SugarRoll.be Fact Sheet for Healthcare Providers: https://www.woods-mathews.com/ This test is not yet approved or cleared by the [place] FDA and  has been authorized for detection and/or diagnosis of SARS-CoV-2 by FDA under an Emergency Use Authorization (EUA). This EUA will remain  in effect (meaning this test can be used) for the duration of the COVID-19 declaration under Section 56 4(b)(1) of the Act, 21 U.S.C. section 360bbb-3(b)(1), unless the authorization is terminated or revoked sooner. Performed  at [facility], [place] [address], [place], [place] [address]   MRSA PCR Screening     Status: Abnormal   Collection Time: 01/18/19  5:38 PM   Specimen: Nasopharyngeal  Result Value Ref Range Status   MRSA by PCR POSITIVE (A) NEGATIVE Final    Comment:        The GeneXpert MRSA Assay (FDA approved for NASAL specimens only), is one component of a comprehensive MRSA colonization surveillance program. It is  not intended to diagnose MRSA infection nor to guide or monitor treatment for MRSA infections. CRITICAL RESULT CALLED TO, READ BACK BY AND VERIFIED WITH: Malcolm Metro RN AT 2113 ON 01/18/2019 [facility]  Performed at [facility], [address] [place],  [address]     Coagulation Studies: Recent Labs    01/18/19 0816  LABPROT 17.8*  INR 1.5*    Urinalysis: No results for input(s): COLORURINE, LABSPEC, PHURINE, GLUCOSEU, HGBUR, BILIRUBINUR, KETONESUR, PROTEINUR, UROBILINOGEN, NITRITE, LEUKOCYTESUR in the last 72 hours.  Invalid input(s): APPERANCEUR    Imaging: Dg Chest Port 1 View  Result Date: 01/18/2019 CLINICAL DATA:  Chest pain. EXAM: PORTABLE CHEST 1 VIEW COMPARISON:  August 24, 2018 FINDINGS: Postsurgical changes from CABG. Enlarged cardiac silhouette. Mediastinal contours appear intact. Mild pulmonary vascular congestion. Osseous structures are without acute abnormality. Soft tissues are grossly normal. IMPRESSION: Markedly enlarged cardiac silhouette. Mild pulmonary vascular congestion. Electronically Signed   By: Fidela Salisbury M.D.   On: 01/18/2019 08:46     Medications:    Current Facility-Administered Medications:  .  acetaminophen (TYLENOL) tablet 650 mg, 650 mg, Oral, Q4H PRN, Max Sane, MD .  amiodarone (PACERONE) tablet 200 mg, 200 mg, Oral, BID, Max Sane, MD, Stopped at 01/18/19 2116 .  apixaban (ELIQUIS) tablet 5 mg, 5 mg, Oral, BID, Max Sane, MD, 5 mg at 01/19/19 1230 .  aspirin chewable tablet 81 mg, 81 mg, Oral, Daily, Manuella Ghazi, Vipul, MD, 81 mg at 01/19/19 1230 .  Chlorhexidine Gluconate Cloth 2 % PADS 6 each, 6 each, Topical, Q0600, Max Sane, MD, 6 each at 01/19/19 0640 .  cinacalcet (SENSIPAR) tablet 60 mg, 60 mg, Oral, QPM, Manuella Ghazi, Vipul, MD, 60 mg at 01/18/19 1736 .  digoxin (LANOXIN) tablet 0.0625 mg, 0.0625 mg, Oral, Daily, Manuella Ghazi, Vipul, MD .  feeding supplement (NEPRO CARB STEADY) liquid 237 mL, 237 mL, Oral, BID BM, Manuella Ghazi,  Vipul, MD .  HYDROcodone-acetaminophen (NORCO/VICODIN) 5-325 MG per tablet 1 tablet, 1 tablet, Oral, Q4H PRN, Max Sane, MD, 1 tablet at 01/19/19 1230 .  hydrOXYzine (ATARAX/VISTARIL) tablet 50 mg, 50 mg, Oral, BID PRN, Max Sane, MD, 50 mg at 01/18/19 2043 .  isosorbide mononitrate (IMDUR) 24 hr tablet 60 mg, 60 mg, Oral, Daily, Manuella Ghazi, Vipul, MD .  levothyroxine (SYNTHROID) tablet 75 mcg, 75 mcg, Oral, Q0600, Max Sane, MD .  multivitamin (RENA-VIT) tablet 1 tablet, 1 tablet, Oral, QHS, Max Sane, MD, 1 tablet at 01/18/19 2152 .  mupirocin ointment (BACTROBAN) 2 % 1 application, 1 application, Nasal, BID, Manuella Ghazi, Vipul, MD .  ondansetron (ZOFRAN) injection 4 mg, 4 mg, Intravenous, Q6H PRN, Manuella Ghazi, Vipul, MD .  pantoprazole (PROTONIX) EC tablet 40 mg, 40 mg, Oral, Daily, Manuella Ghazi, Vipul, MD, 40 mg at 01/19/19 1230 .  ranolazine (RANEXA) 12 hr tablet 500 mg, 500 mg, Oral, BID, Max Sane, MD, 500 mg at 01/18/19 2153 .  sevelamer carbonate (RENVELA) tablet 2,400 mg, 2,400 mg, Oral, TID WC, Kolluru, Sarath, MD, 2,400 mg at 01/19/19 1231 .  sucralfate (CARAFATE) tablet 1 g, 1 g, Oral, BID, Max Sane, MD, 1 g at 01/19/19 1230     Assessment/ Plan:  Ms. Jolicia Delira Braggs is a 58 y.o. black female with end stage renal disease on hemodialysis, hypertension, CVA, atrial fibrillation, coronary artery disease, congestive heart failure, pulmonary embolism presents for Chest pain in adult [R07.9]  Rand. MWF 69kg   1. End Stage Renal Disease: on hemodialysis HD for 2 hours today per patient request Does not appear volume overload Post HD weight Resume HD as outpatient tomorrow AM  2. Anemia with chronic kidney disease:  Lab Results  Component Value Date   HGB 11.6 (L) 01/18/2019   - EPO as outpatient.   4. Secondary Hyperparathyroidism:  Lab Results  Component Value Date   PTH 414 (H) 02/02/2018   CALCIUM 8.6 (L) 01/18/2019   PHOS 2.2 (L) 01/18/2019   - restart  cinacalcet and sevelamer.    LOS: 0 Othell Jaime 8/6/20202:12 PM

## 2019-01-19 NOTE — Progress Notes (Signed)
Post HD Tx   01/19/19 1530  Hand-Off documentation  Report given to (Full Name) Tammy RN  Report received from (Full Name) Newt Minion RN  Vital Signs  Temp (!) 97.5 F (36.4 C)  Temp Source Oral  Pulse Rate 71  Resp 13  BP 105/63  BP Location Right Arm  BP Method Automatic  Patient Position (if appropriate) Lying  Oxygen Therapy  SpO2 93 %  O2 Flow Rate (L/min) 2 L/min  During Hemodialysis Assessment  Blood Flow Rate (mL/min) 400 mL/min  Arterial Pressure (mmHg) -230 mmHg  Venous Pressure (mmHg) 200 mmHg  Transmembrane Pressure (mmHg) 70 mmHg  Ultrafiltration Rate (mL/min) 1250 mL/min  Dialysate Flow Rate (mL/min) 600 ml/min  Conductivity: Machine  14  HD Safety Checks Performed Yes  Intra-Hemodialysis Comments Tx completed;Tolerated well  Post-Hemodialysis Assessment  Rinseback Volume (mL) 250 mL  KECN 45.6 V  Dialyzer Clearance Lightly streaked  Duration of HD Treatment -hour(s) 2 hour(s)  Hemodialysis Intake (mL) 500 mL  UF Total -Machine (mL) 1530 mL  Net UF (mL) 1030 mL  Tolerated HD Treatment Yes  Post-Hemodialysis Comments  (tolerated it well)  Education / Care Plan  Dialysis Education Provided Yes

## 2019-01-20 LAB — NM MYOCAR MULTI W/SPECT W/WALL MOTION / EF
Estimated workload: 1 METS
Exercise duration (min): 1 min
Exercise duration (sec): 0 s
LV dias vol: 241 mL (ref 46–106)
LV sys vol: 176 mL
MPHR: 164 {beats}/min
Peak HR: 90 {beats}/min
Percent HR: 54 %
Rest HR: 75 {beats}/min
SDS: 0
SRS: 38
SSS: 31
TID: 1.22

## 2019-01-23 ENCOUNTER — Ambulatory Visit: Payer: Self-pay | Admitting: Cardiovascular Disease

## 2019-01-23 DIAGNOSIS — I200 Unstable angina: Secondary | ICD-10-CM | POA: Insufficient documentation

## 2019-01-23 MED ORDER — SODIUM CHLORIDE 0.9% FLUSH
3.0000 mL | Freq: Two times a day (BID) | INTRAVENOUS | Status: DC
  Administered 2019-10-06: 350 mL via INTRAVENOUS
  Filled 2019-01-23: qty 3

## 2019-01-26 ENCOUNTER — Other Ambulatory Visit: Payer: Self-pay

## 2019-01-26 ENCOUNTER — Other Ambulatory Visit
Admission: RE | Admit: 2019-01-26 | Discharge: 2019-01-26 | Disposition: A | Discharge status: Home / Self Care | Payer: Medicare Other | Source: Ambulatory Visit | Attending: Cardiovascular Disease | Admitting: Cardiovascular Disease

## 2019-01-26 DIAGNOSIS — Z20828 Contact with and (suspected) exposure to other viral communicable diseases: Secondary | ICD-10-CM | POA: Diagnosis not present

## 2019-01-26 DIAGNOSIS — Z01812 Encounter for preprocedural laboratory examination: Secondary | ICD-10-CM | POA: Diagnosis not present

## 2019-01-27 LAB — SARS CORONAVIRUS 2 (TAT 6-24 HRS): SARS Coronavirus 2: NEGATIVE

## 2019-01-30 ENCOUNTER — Other Ambulatory Visit: Payer: Self-pay

## 2019-01-30 ENCOUNTER — Observation Stay
Admission: RE | Admit: 2019-01-30 | Discharge: 2019-01-31 | Disposition: A | Discharge status: Home / Self Care | Payer: Medicare Other | Attending: Internal Medicine | Admitting: Internal Medicine

## 2019-01-30 ENCOUNTER — Encounter
Admission: RE | Disposition: A | Discharge status: Home / Self Care | Payer: Self-pay | Source: Home / Self Care | Attending: Internal Medicine

## 2019-01-30 DIAGNOSIS — Z888 Allergy status to other drugs, medicaments and biological substances status: Secondary | ICD-10-CM | POA: Diagnosis not present

## 2019-01-30 DIAGNOSIS — I480 Paroxysmal atrial fibrillation: Secondary | ICD-10-CM | POA: Diagnosis not present

## 2019-01-30 DIAGNOSIS — E785 Hyperlipidemia, unspecified: Secondary | ICD-10-CM | POA: Insufficient documentation

## 2019-01-30 DIAGNOSIS — I5042 Chronic combined systolic (congestive) and diastolic (congestive) heart failure: Secondary | ICD-10-CM | POA: Insufficient documentation

## 2019-01-30 DIAGNOSIS — Z881 Allergy status to other antibiotic agents status: Secondary | ICD-10-CM | POA: Diagnosis not present

## 2019-01-30 DIAGNOSIS — I252 Old myocardial infarction: Secondary | ICD-10-CM | POA: Diagnosis not present

## 2019-01-30 DIAGNOSIS — I132 Hypertensive heart and chronic kidney disease with heart failure and with stage 5 chronic kidney disease, or end stage renal disease: Secondary | ICD-10-CM | POA: Insufficient documentation

## 2019-01-30 DIAGNOSIS — N186 End stage renal disease: Secondary | ICD-10-CM | POA: Diagnosis present

## 2019-01-30 DIAGNOSIS — Z791 Long term (current) use of non-steroidal anti-inflammatories (NSAID): Secondary | ICD-10-CM | POA: Diagnosis not present

## 2019-01-30 DIAGNOSIS — I200 Unstable angina: Secondary | ICD-10-CM

## 2019-01-30 DIAGNOSIS — Z79899 Other long term (current) drug therapy: Secondary | ICD-10-CM | POA: Insufficient documentation

## 2019-01-30 DIAGNOSIS — Z7982 Long term (current) use of aspirin: Secondary | ICD-10-CM | POA: Diagnosis not present

## 2019-01-30 DIAGNOSIS — F17210 Nicotine dependence, cigarettes, uncomplicated: Secondary | ICD-10-CM | POA: Insufficient documentation

## 2019-01-30 DIAGNOSIS — E039 Hypothyroidism, unspecified: Secondary | ICD-10-CM | POA: Insufficient documentation

## 2019-01-30 DIAGNOSIS — Z992 Dependence on renal dialysis: Secondary | ICD-10-CM | POA: Diagnosis not present

## 2019-01-30 DIAGNOSIS — Z8673 Personal history of transient ischemic attack (TIA), and cerebral infarction without residual deficits: Secondary | ICD-10-CM | POA: Diagnosis not present

## 2019-01-30 DIAGNOSIS — Z79890 Hormone replacement therapy (postmenopausal): Secondary | ICD-10-CM | POA: Insufficient documentation

## 2019-01-30 DIAGNOSIS — Z885 Allergy status to narcotic agent status: Secondary | ICD-10-CM | POA: Diagnosis not present

## 2019-01-30 DIAGNOSIS — Z7901 Long term (current) use of anticoagulants: Secondary | ICD-10-CM | POA: Insufficient documentation

## 2019-01-30 DIAGNOSIS — I25110 Atherosclerotic heart disease of native coronary artery with unstable angina pectoris: Principal | ICD-10-CM | POA: Insufficient documentation

## 2019-01-30 HISTORY — PX: LEFT HEART CATH AND CORS/GRAFTS ANGIOGRAPHY: CATH118250

## 2019-01-30 LAB — COMPREHENSIVE METABOLIC PANEL
ALT: 8 U/L (ref 0–44)
AST: 14 U/L — ABNORMAL LOW (ref 15–41)
Albumin: 4.1 g/dL (ref 3.5–5.0)
Alkaline Phosphatase: 96 U/L (ref 38–126)
Anion gap: 12 (ref 5–15)
BUN: 63 mg/dL — ABNORMAL HIGH (ref 6–20)
CO2: 25 mmol/L (ref 22–32)
Calcium: 8.9 mg/dL (ref 8.9–10.3)
Chloride: 98 mmol/L (ref 98–111)
Creatinine, Ser: 8.79 mg/dL — ABNORMAL HIGH (ref 0.44–1.00)
GFR calc Af Amer: 5 mL/min — ABNORMAL LOW (ref >60)
GFR calc non Af Amer: 5 mL/min — ABNORMAL LOW (ref >60)
Glucose, Bld: 81 mg/dL (ref 70–99)
Potassium: 5.5 mmol/L — ABNORMAL HIGH (ref 3.5–5.1)
Sodium: 135 mmol/L (ref 135–145)
Total Bilirubin: 0.8 mg/dL (ref 0.3–1.2)
Total Protein: 7.7 g/dL (ref 6.5–8.1)

## 2019-01-30 SURGERY — LEFT HEART CATH AND CORS/GRAFTS ANGIOGRAPHY
Anesthesia: Moderate Sedation | Laterality: Right

## 2019-01-30 MED ORDER — HEPARIN (PORCINE) IN NACL 1000-0.9 UT/500ML-% IV SOLN
INTRAVENOUS | Status: AC
  Filled 2019-01-30: qty 1000

## 2019-01-30 MED ORDER — ISOSORBIDE MONONITRATE ER 30 MG PO TB24
60.0000 mg | ORAL_TABLET | Freq: Every day | ORAL | Status: DC
  Administered 2019-01-31: 60 mg via ORAL
  Filled 2019-01-30 (×2): qty 1

## 2019-01-30 MED ORDER — CINACALCET HCL 30 MG PO TABS
60.0000 mg | ORAL_TABLET | Freq: Every evening | ORAL | Status: DC
  Administered 2019-01-30: 60 mg via ORAL
  Filled 2019-01-30 (×2): qty 2

## 2019-01-30 MED ORDER — APIXABAN 2.5 MG PO TABS: 2.5000 mg | ORAL_TABLET | Freq: Two times a day (BID) | ORAL | Status: DC

## 2019-01-30 MED ORDER — POLYETHYLENE GLYCOL 3350 17 G PO PACK: 17.0000 g | PACK | Freq: Every day | ORAL | Status: DC | PRN

## 2019-01-30 MED ORDER — ACETAMINOPHEN 325 MG PO TABS: 650.0000 mg | ORAL_TABLET | ORAL | Status: DC | PRN

## 2019-01-30 MED ORDER — IPRATROPIUM-ALBUTEROL 0.5-2.5 (3) MG/3ML IN SOLN
3.0000 mL | Freq: Four times a day (QID) | RESPIRATORY_TRACT | Status: DC
  Administered 2019-01-30 – 2019-01-31 (×2): 3 mL via RESPIRATORY_TRACT
  Filled 2019-01-30 (×2): qty 3

## 2019-01-30 MED ORDER — ONDANSETRON HCL 4 MG/2ML IJ SOLN: 4.0000 mg | Freq: Four times a day (QID) | INTRAMUSCULAR | Status: DC | PRN

## 2019-01-30 MED ORDER — SODIUM CHLORIDE 0.9% FLUSH: 3.0000 mL | Freq: Two times a day (BID) | INTRAVENOUS | Status: DC

## 2019-01-30 MED ORDER — HYDRALAZINE HCL 20 MG/ML IJ SOLN: 10.0000 mg | INTRAMUSCULAR | Status: DC | PRN

## 2019-01-30 MED ORDER — LEVOTHYROXINE SODIUM 50 MCG PO TABS
75.0000 ug | ORAL_TABLET | Freq: Every day | ORAL | Status: DC
  Administered 2019-01-31: 75 ug via ORAL
  Filled 2019-01-30: qty 2
  Filled 2019-01-30: qty 1

## 2019-01-30 MED ORDER — MIDAZOLAM HCL 2 MG/2ML IJ SOLN
INTRAMUSCULAR | Status: AC
  Filled 2019-01-30: qty 2

## 2019-01-30 MED ORDER — SODIUM CHLORIDE 0.9% FLUSH: 3.0000 mL | INTRAVENOUS | Status: DC | PRN

## 2019-01-30 MED ORDER — ACETAMINOPHEN 650 MG RE SUPP: 650.0000 mg | Freq: Four times a day (QID) | RECTAL | Status: DC | PRN

## 2019-01-30 MED ORDER — IOHEXOL 300 MG/ML  SOLN
INTRAMUSCULAR | Status: DC | PRN
  Administered 2019-01-30: 140 mL via INTRA_ARTERIAL

## 2019-01-30 MED ORDER — OXYCODONE-ACETAMINOPHEN 5-325 MG PO TABS
1.0000 | ORAL_TABLET | Freq: Two times a day (BID) | ORAL | Status: DC | PRN
  Administered 2019-01-31: 1 via ORAL
  Filled 2019-01-30 (×2): qty 1

## 2019-01-30 MED ORDER — AMIODARONE HCL 200 MG PO TABS
200.0000 mg | ORAL_TABLET | Freq: Two times a day (BID) | ORAL | Status: DC
  Administered 2019-01-30 – 2019-01-31 (×2): 200 mg via ORAL
  Filled 2019-01-30 (×2): qty 1

## 2019-01-30 MED ORDER — MUPIROCIN 2 % EX OINT
1.0000 "application " | TOPICAL_OINTMENT | Freq: Two times a day (BID) | CUTANEOUS | Status: DC
  Administered 2019-01-30 – 2019-01-31 (×2): 1 via NASAL
  Filled 2019-01-30: qty 22

## 2019-01-30 MED ORDER — DICLOFENAC SODIUM 1 % TD GEL
4.0000 g | Freq: Four times a day (QID) | TRANSDERMAL | Status: DC
  Filled 2019-01-30: qty 100

## 2019-01-30 MED ORDER — SODIUM CHLORIDE 0.9 % IV SOLN: 250.0000 mL | INTRAVENOUS | Status: DC | PRN

## 2019-01-30 MED ORDER — SODIUM CHLORIDE 0.9 % WEIGHT BASED INFUSION: 1.0000 mL/kg/h | INTRAVENOUS | Status: DC

## 2019-01-30 MED ORDER — LABETALOL HCL 5 MG/ML IV SOLN: 10.0000 mg | INTRAVENOUS | Status: AC | PRN

## 2019-01-30 MED ORDER — LIDOCAINE-PRILOCAINE 2.5-2.5 % EX CREA
TOPICAL_CREAM | CUTANEOUS | Status: AC
  Administered 2019-01-30: 14:00:00 via TOPICAL
  Filled 2019-01-30: qty 5

## 2019-01-30 MED ORDER — LABETALOL HCL 5 MG/ML IV SOLN: 10.0000 mg | INTRAVENOUS | Status: DC | PRN

## 2019-01-30 MED ORDER — MIDAZOLAM HCL 2 MG/2ML IJ SOLN
INTRAMUSCULAR | Status: DC | PRN
  Administered 2019-01-30: 1 mg via INTRAVENOUS

## 2019-01-30 MED ORDER — APIXABAN 5 MG PO TABS
5.0000 mg | ORAL_TABLET | Freq: Two times a day (BID) | ORAL | Status: DC
  Administered 2019-01-30 – 2019-01-31 (×2): 5 mg via ORAL
  Filled 2019-01-30 (×2): qty 1

## 2019-01-30 MED ORDER — LIDOCAINE-PRILOCAINE 2.5-2.5 % EX CREA
TOPICAL_CREAM | Freq: Once | CUTANEOUS | Status: AC
  Administered 2019-01-30: 14:00:00 via TOPICAL

## 2019-01-30 MED ORDER — SEVELAMER CARBONATE 800 MG PO TABS
2400.0000 mg | ORAL_TABLET | Freq: Three times a day (TID) | ORAL | Status: DC
  Administered 2019-01-31: 2400 mg via ORAL
  Filled 2019-01-30: qty 3

## 2019-01-30 MED ORDER — MIDODRINE HCL 5 MG PO TABS
10.0000 mg | ORAL_TABLET | Freq: Every day | ORAL | Status: DC
  Administered 2019-01-31: 10 mg via ORAL
  Filled 2019-01-30 (×2): qty 4

## 2019-01-30 MED ORDER — LIDOCAINE-PRILOCAINE 2.5-2.5 % EX CREA
TOPICAL_CREAM | Freq: Once | CUTANEOUS | Status: DC
  Filled 2019-01-30: qty 5

## 2019-01-30 MED ORDER — ASPIRIN 81 MG PO CHEW
81.0000 mg | CHEWABLE_TABLET | ORAL | Status: AC
  Administered 2019-01-30: 11:00:00 81 mg via ORAL

## 2019-01-30 MED ORDER — ASPIRIN 81 MG PO CHEW
81.0000 mg | CHEWABLE_TABLET | Freq: Every day | ORAL | Status: DC
  Administered 2019-01-31: 81 mg via ORAL
  Filled 2019-01-30: qty 1

## 2019-01-30 MED ORDER — SACUBITRIL-VALSARTAN 49-51 MG PO TABS
1.0000 | ORAL_TABLET | Freq: Two times a day (BID) | ORAL | Status: DC
  Administered 2019-01-30 – 2019-01-31 (×2): 1 via ORAL
  Filled 2019-01-30 (×3): qty 1

## 2019-01-30 MED ORDER — SEVELAMER CARBONATE 800 MG PO TABS: 800.0000 mg | ORAL_TABLET | Freq: Two times a day (BID) | ORAL | Status: DC | PRN

## 2019-01-30 MED ORDER — ONDANSETRON HCL 4 MG PO TABS: 4.0000 mg | ORAL_TABLET | Freq: Four times a day (QID) | ORAL | Status: DC | PRN

## 2019-01-30 MED ORDER — HYDROXYZINE HCL 25 MG PO TABS
50.0000 mg | ORAL_TABLET | Freq: Two times a day (BID) | ORAL | Status: DC | PRN
  Filled 2019-01-30: qty 1

## 2019-01-30 MED ORDER — FENTANYL CITRATE (PF) 100 MCG/2ML IJ SOLN
INTRAMUSCULAR | Status: AC
  Filled 2019-01-30: qty 2

## 2019-01-30 MED ORDER — FENTANYL CITRATE (PF) 100 MCG/2ML IJ SOLN
INTRAMUSCULAR | Status: DC | PRN
  Administered 2019-01-30: 25 ug via INTRAVENOUS

## 2019-01-30 MED ORDER — IPRATROPIUM-ALBUTEROL 0.5-2.5 (3) MG/3ML IN SOLN
RESPIRATORY_TRACT | Status: AC
  Administered 2019-01-30: 3 mL via RESPIRATORY_TRACT
  Filled 2019-01-30: qty 3

## 2019-01-30 MED ORDER — HEPARIN (PORCINE) IN NACL 1000-0.9 UT/500ML-% IV SOLN
INTRAVENOUS | Status: DC | PRN
  Administered 2019-01-30: 500 mL

## 2019-01-30 MED ORDER — SODIUM CHLORIDE 0.9 % WEIGHT BASED INFUSION: 3.0000 mL/kg/h | INTRAVENOUS | Status: DC

## 2019-01-30 MED ORDER — CHLORHEXIDINE GLUCONATE CLOTH 2 % EX PADS
6.0000 | MEDICATED_PAD | Freq: Every day | CUTANEOUS | Status: DC
  Administered 2019-01-31: 6 via TOPICAL

## 2019-01-30 MED ORDER — PANTOPRAZOLE SODIUM 40 MG PO TBEC
40.0000 mg | DELAYED_RELEASE_TABLET | Freq: Every day | ORAL | Status: DC
  Administered 2019-01-31: 40 mg via ORAL
  Filled 2019-01-30: qty 1

## 2019-01-30 MED ORDER — ACETAMINOPHEN 325 MG PO TABS
650.0000 mg | ORAL_TABLET | Freq: Four times a day (QID) | ORAL | Status: DC | PRN
  Filled 2019-01-30: qty 2

## 2019-01-30 MED ORDER — SODIUM CHLORIDE 0.9 % WEIGHT BASED INFUSION
1.0000 mL/kg/h | INTRAVENOUS | Status: DC
  Administered 2019-01-30: 0.136 mL/kg/h via INTRAVENOUS

## 2019-01-30 MED ORDER — ASPIRIN 81 MG PO CHEW
CHEWABLE_TABLET | ORAL | Status: AC
  Administered 2019-01-30: 81 mg via ORAL
  Filled 2019-01-30: qty 1

## 2019-01-30 SURGICAL SUPPLY — 12 items
CATH ANGIO 5F JB2 100CM (CATHETERS) ×1 IMPLANT
CATH INFINITI 5 FR IM (CATHETERS) ×1 IMPLANT
CATH INFINITI 5FR JL4 (CATHETERS) ×1 IMPLANT
CATH INFINITI JR4 5F (CATHETERS) ×1 IMPLANT
DEVICE CLOSURE MYNXGRIP 5F (Vascular Products) ×1 IMPLANT
KIT MANI 3VAL PERCEP (MISCELLANEOUS) ×3 IMPLANT
NDL PERC 18GX7CM (NEEDLE) IMPLANT
NEEDLE PERC 18GX7CM (NEEDLE) ×3 IMPLANT
PACK CARDIAC CATH (CUSTOM PROCEDURE TRAY) ×3 IMPLANT
SHEATH AVANTI 5FR X 11CM (SHEATH) ×1 IMPLANT
WIRE EMERALD 3MM-J .035X260CM (WIRE) ×1 IMPLANT
WIRE GUIDERIGHT .035X150 (WIRE) ×2 IMPLANT

## 2019-01-30 NOTE — Discharge Instructions (Signed)
Femoral Site Care °This sheet gives you information about how to care for yourself after your procedure. Your health care provider may also give you more specific instructions. If you have problems or questions, contact your health care provider. °What can I expect after the procedure? °After the procedure, it is common to have: °· Bruising that usually fades within 1-2 weeks. °· Tenderness at the site. °Follow these instructions at home: °Wound care °· Follow instructions from your health care provider about how to take care of your insertion site. Make sure you: °? Wash your hands with soap and water before you change your bandage (dressing). If soap and water are not available, use hand sanitizer. °? Change your dressing as told by your health care provider. °? Leave stitches (sutures), skin glue, or adhesive strips in place. These skin closures may need to stay in place for 2 weeks or longer. If adhesive strip edges start to loosen and curl up, you may trim the loose edges. Do not remove adhesive strips completely unless your health care provider tells you to do that. °· Do not take baths, swim, or use a hot tub until your health care provider approves. °· You may shower 24-48 hours after the procedure or as told by your health care provider. °? Gently wash the site with plain soap and water. °? Pat the area dry with a clean towel. °? Do not rub the site. This may cause bleeding. °· Do not apply powder or lotion to the site. Keep the site clean and dry. °· Check your femoral site every day for signs of infection. Check for: °? Redness, swelling, or pain. °? Fluid or blood. °? Warmth. °? Pus or a bad smell. °Activity °· For the first 2-3 days after your procedure, or as long as directed: °? Avoid climbing stairs as much as possible. °? Do not squat. °· Do not lift anything that is heavier than 10 lb (4.5 kg), or the limit that you are told, until your health care provider says that it is safe. °· Rest as  directed. °? Avoid sitting for a long time without moving. Get up to take short walks every 1-2 hours. °· Do not drive for 24 hours if you were given a medicine to help you relax (sedative). °General instructions °· Take over-the-counter and prescription medicines only as told by your health care provider. °· Keep all follow-up visits as told by your health care provider. This is important. °Contact a health care provider if you have: °· A fever or chills. °· You have redness, swelling, or pain around your insertion site. °Get help right away if: °· The catheter insertion area swells very fast. °· You pass out. °· You suddenly start to sweat or your skin gets clammy. °· The catheter insertion area is bleeding, and the bleeding does not stop when you hold steady pressure on the area. °· The area near or just beyond the catheter insertion site becomes pale, cool, tingly, or numb. °These symptoms may represent a serious problem that is an emergency. Do not wait to see if the symptoms will go away. Get medical help right away. Call your local emergency services (911 in the [place] Do not drive yourself to the hospital. °Summary °· After the procedure, it is common to have bruising that usually fades within 1-2 weeks. °· Check your femoral site every day for signs of infection. °· Do not lift anything that is heavier than 10 lb (4.5 kg), or the   limit that you are told, until your health care provider says that it is safe. This information is not intended to replace advice given to you by your health care provider. Make sure you discuss any questions you have with your health care provider. Document Released: 02/02/2014 Document Revised: 06/14/2017 Document Reviewed: 06/14/2017 Elsevier Patient Education  2020 [place] After This sheet gives you information about how to care for yourself after your procedure. Your doctor may also give you more specific instructions. If you have problems or  questions, contact your doctor. Follow these instructions at home: Insertion site care  Follow instructions from your doctor about how to take care of your long, thin tube (catheter) insertion area. Make sure you: ? Wash your hands with soap and water before you change your bandage (dressing). If you cannot use soap and water, use hand sanitizer. ? Change your bandage as told by your doctor. ? Leave stitches (sutures), skin glue, or skin tape (adhesive) strips in place. They may need to stay in place for 2 weeks or longer. If tape strips get loose and curl up, you may trim the loose edges. Do not remove tape strips completely unless your doctor says it is okay.  Do not take baths, swim, or use a hot tub until your doctor says it is okay.  You may shower 24-48 hours after the procedure or as told by your doctor. ? Gently wash the area with plain soap and water. ? Pat the area dry with a clean towel. ? Do not rub the area. This may cause bleeding.  Do not apply powder or lotion to the area. Keep the area clean and dry.  Check your insertion area every day for signs of infection. Check for: ? More redness, swelling, or pain. ? Fluid or blood. ? Warmth. ? Pus or a bad smell. Activity  Rest as told by your doctor, usually for 1-2 days.  Do not lift anything that is heavier than 10 lbs. (4.5 kg) or as told by your doctor.  Do not drive for 24 hours if you were given a medicine to help you relax (sedative).  Do not drive or use heavy machinery while taking prescription pain medicine. General instructions   Go back to your normal activities as told by your doctor, usually in about a week. Ask your doctor what activities are safe for you.  If the insertion area starts to bleed, lie flat and put pressure on the area. If the bleeding does not stop, get help right away. This is an emergency.  Drink enough fluid to keep your pee (urine) clear or pale yellow.  Take over-the-counter and  prescription medicines only as told by your doctor.  Keep all follow-up visits as told by your doctor. This is important. Contact a doctor if:  You have a fever.  You have chills.  You have more redness, swelling, or pain around your insertion area.  You have fluid or blood coming from your insertion area.  The insertion area feels warm to the touch.  You have pus or a bad smell coming from your insertion area.  You have more bruising around the insertion area.  Blood collects in the tissue around the insertion area (hematoma) that may be painful to the touch. Get help right away if:  You have a lot of pain in the insertion area.  The insertion area swells very fast.  The insertion area is bleeding, and the bleeding  does not stop after holding steady pressure on the area.  The area near or just beyond the insertion area becomes pale, cool, tingly, or numb. These symptoms may be an emergency. Do not wait to see if the symptoms will go away. Get medical help right away. Call your local emergency services (911 in the [place] Do not drive yourself to the hospital. Summary  After the procedure, it is common to have bruising and tenderness at the long, thin tube insertion area.  After the procedure, it is important to rest and drink plenty of fluids.  Do not take baths, swim, or use a hot tub until your doctor says it is okay to do so. You may shower 24-48 hours after the procedure or as told by your doctor.  If the insertion area starts to bleed, lie flat and put pressure on the area. If the bleeding does not stop, get help right away. This is an emergency. This information is not intended to replace advice given to you by your health care provider. Make sure you discuss any questions you have with your health care provider. Document Released: 08/28/2008 Document Revised: 05/14/2017 Document Reviewed: 05/26/2016 Elsevier Patient Education  [place].    Moderate  Conscious Sedation, Adult, Care After These instructions provide you with information about caring for yourself after your procedure. Your health care provider may also give you more specific instructions. Your treatment has been planned according to current medical practices, but problems sometimes occur. Call your health care provider if you have any problems or questions after your procedure. What can I expect after the procedure? After your procedure, it is common:  To feel sleepy for several hours.  To feel clumsy and have poor balance for several hours.  To have poor judgment for several hours.  To vomit if you eat too soon. Follow these instructions at home: For at least 24 hours after the procedure:   Do not: ? Participate in activities where you could fall or become injured. ? Drive. ? Use heavy machinery. ? Drink alcohol. ? Take sleeping pills or medicines that cause drowsiness. ? Make important decisions or sign legal documents. ? Take care of children on your own.  Rest. Eating and drinking  Follow the diet recommended by your health care provider.  If you vomit: ? Drink water, juice, or soup when you can drink without vomiting. ? Make sure you have little or no nausea before eating solid foods. General instructions  Have a responsible adult stay with you until you are awake and alert.  Take over-the-counter and prescription medicines only as told by your health care provider.  If you smoke, do not smoke without supervision.  Keep all follow-up visits as told by your health care provider. This is important. Contact a health care provider if:  You keep feeling nauseous or you keep vomiting.  You feel light-headed.  You develop a rash.  You have a fever. Get help right away if:  You have trouble breathing. This information is not intended to replace advice given to you by your health care provider. Make sure you discuss any questions you have with your  health care provider. Document Released: 03/22/2013 Document Revised: 05/14/2017 Document Reviewed: 09/21/2015 Elsevier Patient Education  2020 [employer].

## 2019-01-30 NOTE — Progress Notes (Signed)
HD Completed:     01/30/19 1915  Vital Signs  Temp (!) 97 F (36.1 C)  Temp Source Axillary  Pulse Rate 65  Pulse Rate Source Dinamap  Resp 18  BP 110/67  BP Location Right Arm  BP Method Automatic  Patient Position (if appropriate) Lying  Oxygen Therapy  SpO2 100 %  O2 Device Nasal Cannula  O2 Flow Rate (L/min) 2.5 L/min  Pain Assessment  Pain Scale 0-10  Pain Score 0  During Hemodialysis Assessment  Blood Flow Rate (mL/min) 400 mL/min  Arterial Pressure (mmHg) -200 mmHg  Venous Pressure (mmHg) 170 mmHg  Transmembrane Pressure (mmHg) 60 mmHg  Ultrafiltration Rate (mL/min) 100 mL/min  Dialysate Flow Rate (mL/min) 800 ml/min  Conductivity: Machine  13.9  HD Safety Checks Performed Yes  Intra-Hemodialysis Comments Tx completed

## 2019-01-30 NOTE — Progress Notes (Signed)
Pre HD Assessment:    01/30/19 1530  Neurological  Level of Consciousness Alert  Respiratory  Respiratory Pattern Regular;Unlabored  Chest Assessment Chest expansion symmetrical  Cardiac  Pulse Regular  Heart Sounds S1, S2  Vascular  R Radial Pulse +2  L Radial Pulse +2  Psychosocial  Psychosocial (WDL) WDL

## 2019-01-30 NOTE — Progress Notes (Signed)
HD Initiated:    01/30/19 1530  Vital Signs  Temp (!) 97.1 F (36.2 C)  Temp Source Axillary  Pulse Rate 88  Pulse Rate Source Dinamap  Resp 19  BP 99/66  BP Location Right Arm  BP Method Automatic  Patient Position (if appropriate) Lying  Oxygen Therapy  SpO2 100 %  O2 Device Nasal Cannula  O2 Flow Rate (L/min) 2.5 L/min  Pain Assessment  Pain Scale 0-10  Pain Score 0  During Hemodialysis Assessment  Blood Flow Rate (mL/min) 400 mL/min  Arterial Pressure (mmHg) -150 mmHg  Venous Pressure (mmHg) 180 mmHg  Transmembrane Pressure (mmHg) 70 mmHg  Ultrafiltration Rate (mL/min) 1000 mL/min  Dialysate Flow Rate (mL/min) 800 ml/min  Conductivity: Machine  14.3  HD Safety Checks Performed Yes  Intra-Hemodialysis Comments Tx initiated  Fistula / Graft Left Upper arm Arteriovenous fistula  No Placement Date or Time found.   Placed prior to admission: Yes  Orientation: Left  Access Location: Upper arm  Access Type: Arteriovenous fistula  Site Condition No complications  Fistula / Graft Assessment Bruit;Thrill;Present  Status Accessed  Needle Size 15  Drainage Description None

## 2019-01-30 NOTE — Progress Notes (Signed)
Pre HD Note:     01/30/19 1521  Vital Signs  Temp (!) 97.1 F (36.2 C)  Temp Source Axillary  Pulse Rate 87  Pulse Rate Source Dinamap  Resp 19  BP 103/72  BP Location Right Arm  BP Method Automatic  Patient Position (if appropriate) Lying  Oxygen Therapy  SpO2 99 %  O2 Device Nasal Cannula  O2 Flow Rate (L/min) 2.5 L/min  Pain Assessment  Pain Scale 0-10  Pain Score 0  Time-Out for Hemodialysis  What Procedure? HD   Pt Identifiers(min of two) First/Last Name;MRN/Account#;Pt's DOB(use if MRN/Acct# not available  Correct Site? Yes  Correct Side? Yes  Correct Procedure? Yes  Consents Verified? Yes  Rad Studies Available? N/A  Safety Precautions Reviewed? Yes  Engineer, civil (consulting) Number 4  Station Number 4  UF/Alarm Test Passed  Conductivity: Meter 14.2  Conductivity: Machine  14.1  pH 7.6  Reverse Osmosis main  Normal Saline Lot Number [id number]  Dialyzer Lot Number [id number]  Disposable Set Lot Number [id number]  Machine Temperature 98.6 F (37 C)  Musician and Audible Yes  Blood Lines Intact and Secured Yes  Pre Treatment Patient Checks  Vascular access used during treatment Graft  HD catheter dressing before treatment  (n/a)  Patient is receiving dialysis in a chair  (no)  Hepatitis B Surface Antigen Results Negative  Date Hepatitis B Surface Antigen Drawn 01/18/19  Isolation Initiated  (no)  Hepatitis B Surface Antibody  (<10)  Date Hepatitis B Surface Antibody Drawn 01/18/19  Hemodialysis Consent Verified Yes  Hemodialysis Standing Orders Initiated Yes  ECG (Telemetry) Monitor On Yes  Prime Ordered Normal Saline  Length of  DialysisTreatment -hour(s) 3.5 Hour(s)  Dialysis Treatment Comments  (NA 140)  Dialyzer Elisio 17H NR  Dialysate 2K;2.5 Ca  Dialysate Flow Ordered 800  Blood Flow Rate Ordered 400 mL/min  Ultrafiltration Goal 3 Liters  Dialysis Blood Pressure Support Ordered Normal Saline  Education / Care Plan  Dialysis Education  Provided Yes  Documented Education in Care Plan Yes  Outpatient Plan of Care Reviewed and on Chart Yes  Fistula / Graft Left Upper arm Arteriovenous fistula  No Placement Date or Time found.   Placed prior to admission: Yes  Orientation: Left  Access Location: Upper arm  Access Type: Arteriovenous fistula  Site Condition No complications  Fistula / Graft Assessment Bruit;Thrill;Present  Status Accessed  Needle Size 15  Drainage Description None

## 2019-01-30 NOTE — Progress Notes (Signed)
Post HD:     01/30/19 1930  Vital Signs  Temp (!) 97 F (36.1 C)  Temp Source Axillary  Pulse Rate 65  Pulse Rate Source Dinamap  Resp 16  BP 110/67  BP Location Right Arm  BP Method Automatic  Patient Position (if appropriate) Lying  Oxygen Therapy  SpO2 97 %  O2 Device Nasal Cannula  O2 Flow Rate (L/min) 2.5 L/min  Pain Assessment  Pain Scale 0-10  Pain Score 0  Post-Hemodialysis Assessment  Rinseback Volume (mL) 250 mL  KECN 81.6 V  Dialyzer Clearance Lightly streaked  Duration of HD Treatment -hour(s) 3.5 hour(s)  Hemodialysis Intake (mL) 500 mL  UF Total -Machine (mL) 2566 mL  Net UF (mL) 2066 mL  Tolerated HD Treatment Yes  Post-Hemodialysis Comments  (no)  AVG/AVF Arterial Site Held (minutes) 10 minutes  AVG/AVF Venous Site Held (minutes) 20 minutes  Education / Care Plan  Dialysis Education Provided Yes  Documented Education in Care Plan Yes  Outpatient Plan of Care Reviewed and on Chart Yes  Fistula / Graft Left Upper arm Arteriovenous fistula  No Placement Date or Time found.   Placed prior to admission: Yes  Orientation: Left  Access Location: Upper arm  Access Type: Arteriovenous fistula  Site Condition No complications  Fistula / Graft Assessment Bruit;Thrill;Present  Status Deaccessed  Needle Size 15  Drainage Description None

## 2019-01-30 NOTE — H&P (Signed)
[facility] [place] NAME: [name]    MR#:  [id number]  DATE OF BIRTH:  Feb 15, 1961  DATE OF ADMISSION:  01/30/2019  PRIMARY CARE PHYSICIAN: [name], MD   REQUESTING/REFERRING PHYSICIAN: Lamonte Sakai, MD  CHIEF COMPLAINT:  Shortness of breath, swelling  HISTORY OF PRESENT ILLNESS:  [name]  is a 58 y.o. female with a known history of chronic systolic CHF, hx of hepatitis C, ESRD on HD MWF, hypertension, hyperlipidemia, paroxysmal atrial fibrillation on eliquis, CAD with hx of MI, hx of stroke who came to the hospital today for cardiac cath after an abnormal outpatient stress test. Patient was told to stop her eliquis 2 days prior to her cath, but she was confused and stopped all of her medications instead. She then developed lower extremity edema, abdominal swelling, and some shortness of breath. Patient typically receives HD on MWF, but missed HD this morning due to her cath. She denies any fevers or chills.  PAST MEDICAL HISTORY:   Past Medical History:  Diagnosis Date  . Anemia    chronic disease  . CHF (congestive heart failure) ([place])   . Coronary artery disease   . Heart failure ([place])   . Hepatitis    history of hep c  . Hyperlipidemia   . Hypertension   . Myocardial infarction ([place])   . Paroxysmal atrial fibrillation ([facility])   . Renal failure   . Renal insufficiency   . Stroke [facility]) 2011    PAST SURGICAL HISTORY:   Past Surgical History:  Procedure Laterality Date  . CORONARY ANGIOPLASTY WITH STENT PLACEMENT  2013  . CORONARY ARTERY BYPASS GRAFT    . DIALYSIS FISTULA CREATION    . LEFT HEART CATH AND CORS/GRAFTS ANGIOGRAPHY N/A 08/08/2018   Procedure: LEFT HEART CATH AND CORS/GRAFTS ANGIOGRAPHY;  Surgeon: Dionisio David, MD;  Location: [facility];  Service: Cardiovascular;  Laterality: N/A;    SOCIAL HISTORY:   Social History   Tobacco Use  . Smoking status: Current Some Day Smoker   Packs/day: 0.10    Years: 20.00    Pack years: 2.00    Types: Cigarettes  . Smokeless tobacco: Never Used  Substance Use Topics  . Alcohol use: No    FAMILY HISTORY:   Family History  Problem Relation Age of Onset  . Hypertension Other   . Cancer Other   . Renal Disease Other   . Ovarian cancer Mother   . Kidney disease Father   . Breast cancer Maternal Aunt   . Breast cancer Maternal Grandmother     DRUG ALLERGIES:   Allergies  Allergen Reactions  . Morphine And Related Hives  . Levaquin [Levofloxacin] Itching    Severe itching; prickly sensation  . Latex Rash  . Tape Rash    REVIEW OF SYSTEMS:   Review of Systems  Constitutional: Negative for chills and fever.  HENT: Negative for congestion and sore throat.   Eyes: Negative for blurred vision and double vision.  Respiratory: Positive for shortness of breath. Negative for cough.   Cardiovascular: Positive for leg swelling. Negative for chest pain and palpitations.  Gastrointestinal: Negative for nausea and vomiting.  Genitourinary: Negative for dysuria and urgency.  Musculoskeletal: Negative for back pain and neck pain.  Neurological: Negative for dizziness and headaches.  Psychiatric/Behavioral: Negative for depression. The patient is not nervous/anxious.      MEDICATIONS AT HOME:   Prior to Admission medications  Medication Sig Start Date End Date Taking? Authorizing Provider  amiodarone (PACERONE) 200 MG tablet Take 200 mg by mouth 2 (two) times daily. 01/10/19  Yes [provider]  aspirin 81 MG chewable tablet Chew 81 mg by mouth daily.   Yes [provider]  cinacalcet (SENSIPAR) 60 MG tablet Take 60 mg by mouth every evening.    Yes [provider]  diclofenac sodium (VOLTAREN) 1 % GEL Apply 4 g topically 4 (four) times daily. 12/22/18  Yes Schuman, Christanna R, MD  hydrOXYzine (ATARAX/VISTARIL) 50 MG tablet Take 50 mg by mouth 2 (two) times daily as needed for itching.    Yes  [provider]  isosorbide mononitrate (IMDUR) 60 MG 24 hr tablet Take 60 mg by mouth daily.  09/14/18  Yes [provider]  levothyroxine (SYNTHROID) 75 MCG tablet Take 75 mcg by mouth daily before breakfast.  11/02/18  Yes [provider]  midodrine (PROAMATINE) 10 MG tablet Take 10-20 mg by mouth daily. (for low BP associated with dialysis)   Yes [provider]  oxyCODONE-acetaminophen (PERCOCET/ROXICET) 5-325 MG tablet Take 1 tablet by mouth 2 (two) times daily as needed for moderate pain.  12/08/18  Yes [provider]  pantoprazole (PROTONIX) 40 MG tablet Take 40 mg by mouth daily.   Yes [provider]  sacubitril-valsartan (ENTRESTO) 49-51 MG Take 1 tablet by mouth 2 (two) times daily.  08/01/18  Yes [provider]  sevelamer carbonate (RENVELA) 800 MG tablet Take 2,400 mg by mouth 3 (three) times daily with meals. And 1 tablet with snacks   Yes [provider]  ELIQUIS 5 MG TABS tablet Take 1 tablet (5 mg total) by mouth 2 (two) times daily. 02/02/18   Tyjai Charbonnet, Pete Pelt, MD      VITAL SIGNS:  Blood pressure 111/84, pulse 78, temperature 97.9 F (36.6 C), temperature source Oral, resp. rate 12, height 5\' 6"  (1.676 m), weight 70.3 kg, SpO2 96 %.  PHYSICAL EXAMINATION:  Physical Exam  GENERAL:  58 y.o.-year-old patient lying in the bed with no acute distress.  EYES: Pupils equal, round, reactive to light and accommodation. No scleral icterus. Extraocular muscles intact.  HEENT: Head atraumatic, normocephalic. Oropharynx and nasopharynx clear.  NECK:  Supple, no jugular venous distention. No thyroid enlargement, no tenderness.  LUNGS: + Diminished breath sounds in lung bases bilaterally, no wheezing, rales,rhonchi or crepitation. No use of accessory muscles of respiration.  CARDIOVASCULAR: Irregularly irregular rhythm, regular rate, S1, S2 normal. No murmurs, rubs, or gallops.  ABDOMEN: Soft, nontender, nondistended.  Bowel sounds present. No organomegaly or mass.  EXTREMITIES: No cyanosis, or clubbing. + Trace pedal edema bilaterally NEUROLOGIC: Cranial nerves II through XII are intact. Muscle strength 5/5 in all extremities. Sensation intact. Gait not checked.  PSYCHIATRIC: The patient is alert and oriented x 3.  SKIN: No obvious rash, lesion, or ulcer.   LABORATORY PANEL:   CBC No results for input(s): WBC, HGB, HCT, PLT in the last 168 hours. ------------------------------------------------------------------------------------------------------------------  Chemistries  Recent Labs  Lab 01/30/19 1117  NA 135  K 5.5*  CL 98  CO2 25  GLUCOSE 81  BUN 63*  CREATININE 8.79*  CALCIUM 8.9  AST 14*  ALT 8  ALKPHOS 96  BILITOT 0.8   ------------------------------------------------------------------------------------------------------------------  Cardiac Enzymes No results for input(s): TROPONINI in the last 168 hours. ------------------------------------------------------------------------------------------------------------------  RADIOLOGY:  No results found.    IMPRESSION AND PLAN:   Unstable angina with a history of CAD- patient  has had a recent abnormal stress test. -s/p cardiac cath with Dr. Humphrey Rolls today- unchanged from previous, no interventions performed.  ESRD on HD MWF- missed HD this morning. -Nephrology consult -Plan for HD after cath -Continue home renvela and sensipar  Paroxysmal atrial fibrillation- rate-controlled -Continue home amiodarone -Restart home eliquis  Chronic systolic CHF- most recent EF 35% -Continue home imdur and entresto  Hypothyroidism- stable -Continue home synthroid  All the records are reviewed and case discussed with ED provider. Management plans discussed with the patient, family and they are in agreement.  CODE STATUS: Full  TOTAL TIME TAKING CARE OF THIS PATIENT: 45 minutes.    Berna Spare Biagio Snelson M.D on 01/30/2019 at 12:05 PM  Between  7am to 6pm - Pager - [phone]  After 6pm go to [email or web address] - Proofreader  [facility]  Office  [phone]  CC: Primary care physician; [name], MD   Note: This dictation was prepared with Dragon dictation along with smaller phrase technology. Any transcriptional errors that result from this process are unintentional.

## 2019-01-30 NOTE — Progress Notes (Signed)
[facility]  ROUNDING NOTE   Subjective:   [name] admitted to [facility] for unstable angina. cardiac catheterization with no intervention.   Seen and examined on hemodialysis. UF goal of 3 liters.    Objective:  Vital signs in last 24 hours:  Temp:  [97.9 F (36.6 C)] 97.9 F (36.6 C) (08/17 1032) Pulse Rate:  [78-83] 83 (08/17 1330) Resp:  [12-20] 12 (08/17 1330) BP: (111-122)/(84-100) 119/91 (08/17 1315) SpO2:  [85 %-100 %] 100 % (08/17 1330) Weight:  [70.3 kg] 70.3 kg (08/17 1032)  Weight change:  Filed Weights   01/30/19 1032  Weight: 70.3 kg    Intake/Output: No intake/output data recorded.   Intake/Output this shift:  No intake/output data recorded.  Physical Exam: General: NAD,   Head: Normocephalic, atraumatic. Moist oral mucosal membranes  Eyes: Anicteric, PERRL  Neck: Supple, trachea midline  Lungs:  Clear to auscultation  Heart: Regular rate and rhythm  Abdomen:  Soft, nontender,   Extremities:  + peripheral edema.  Neurologic: Nonfocal, moving all four extremities  Skin: No lesions  Access: Left AVF    Basic Metabolic Panel: Recent Labs  Lab 01/30/19 1117  NA 135  K 5.5*  CL 98  CO2 25  GLUCOSE 81  BUN 63*  CREATININE 8.79*  CALCIUM 8.9    Liver Function Tests: Recent Labs  Lab 01/30/19 1117  AST 14*  ALT 8  ALKPHOS 96  BILITOT 0.8  PROT 7.7  ALBUMIN 4.1   No results for input(s): LIPASE, AMYLASE in the last 168 hours. No results for input(s): AMMONIA in the last 168 hours.  CBC: No results for input(s): WBC, NEUTROABS, HGB, HCT, MCV, PLT in the last 168 hours.  Cardiac Enzymes: No results for input(s): CKTOTAL, CKMB, CKMBINDEX, TROPONINI in the last 168 hours.  BNP: Invalid input(s): POCBNP  CBG: No results for input(s): GLUCAP in the last 168 hours.  Microbiology: Results for orders placed or performed during the hospital encounter of 01/26/19  SARS CORONAVIRUS 2 Nasal Swab Aptima Multi Swab      Status: None   Collection Time: 01/26/19 11:12 AM   Specimen: Aptima Multi Swab; Nasal Swab  Result Value Ref Range Status   SARS Coronavirus 2 NEGATIVE NEGATIVE Final    Comment: (NOTE) SARS-CoV-2 target nucleic acids are NOT DETECTED. The SARS-CoV-2 RNA is generally detectable in upper and lower respiratory specimens during the acute phase of infection. Negative results do not preclude SARS-CoV-2 infection, do not rule out co-infections with other pathogens, and should not be used as the sole basis for treatment or other patient management decisions. Negative results must be combined with clinical observations, patient history, and epidemiological information. The expected result is Negative. Fact Sheet for Patients: SugarRoll.be Fact Sheet for Healthcare Providers: https://www.woods-mathews.com/ This test is not yet approved or cleared by the [place] FDA and  has been authorized for detection and/or diagnosis of SARS-CoV-2 by FDA under an Emergency Use Authorization (EUA). This EUA will remain  in effect (meaning this test can be used) for the duration of the COVID-19 declaration under Section 56 4(b)(1) of the Act, 21 U.S.C. section 360bbb-3(b)(1), unless the authorization is terminated or revoked sooner. Performed at [facility], [place] [address], [place], [place] [address]     Coagulation Studies: No results for input(s): LABPROT, INR in the last 72 hours.  Urinalysis: No results for input(s): COLORURINE, LABSPEC, PHURINE, GLUCOSEU, HGBUR, BILIRUBINUR, KETONESUR, PROTEINUR, UROBILINOGEN, NITRITE, LEUKOCYTESUR in the last  72 hours.  Invalid input(s): APPERANCEUR    Imaging: No results found.   Medications:   . sodium chloride    . sodium chloride    . sodium chloride    . [START ON 01/31/2019] sodium chloride     Followed by  . [START ON 01/31/2019] sodium chloride 0.136 mL/kg/hr (01/30/19 1111)  . sodium  chloride    . sodium chloride     . apixaban  2.5 mg Oral BID  . ipratropium-albuterol  3 mL Nebulization Q6H  . sodium chloride flush  3 mL Intravenous Q12H  . sodium chloride flush  3 mL Intravenous Q12H   sodium chloride, sodium chloride, sodium chloride, acetaminophen, hydrALAZINE, labetalol, ondansetron (ZOFRAN) IV, sodium chloride flush, sodium chloride flush, sodium chloride flush  Assessment/ Plan:  [name] is a 58 y.o. black female with end stage renal disease on hemodialysis, coronary artery disease status post CABG, hypertension, CVA, atrial fibrillation, congestive heart failure, pulmonary embolism presents for cardiac catheterization with abnormal stress test.   [address] MWF 70kg   1. End Stage Renal Disease: Last dialysis treatment was Friday.  Now with volume overload and pulmonary edema.  - Scheduled dialysis for later today.   2. Anemia with chronic kidney disease:  - EPO as outpatient.   3. Hypertension: 119/91. Has not taken her medications this morning.  Has a history of hypotension on hemodialysis.   4. Secondary Hyperparathyroidism:  -  cinacalcet and sevelamer   LOS: 0 Adlynn Lowenstein 8/17/20201:32 PM

## 2019-01-30 NOTE — Progress Notes (Signed)
Patient presented today stating she has a lot of fluid on her and does not feel that she could go past today without dialysis. The patient is M, W, F dialysis and was not rescheduled for her dialysis that is being missed today due to the heart cath. The patient has a distended abdomen, +1 pitting edema to the lower extremities, inspiratory and expiratory wheezes to all lung bases. Dr. Juleen China and Dr. Humphrey Rolls are are at the bedside assessing the patient to decide if patient will have dialysis today.

## 2019-01-30 NOTE — Progress Notes (Signed)
Post HD Assessment:    01/30/19 1930  Neurological  Level of Consciousness Alert  Respiratory  Respiratory Pattern Regular;Unlabored  Chest Assessment Chest expansion symmetrical  Bilateral Breath Sounds Clear  Cardiac  Pulse Irregular  Heart Sounds S1, S2  Vascular  R Radial Pulse +2  L Radial Pulse +2  Psychosocial  Psychosocial (WDL) WDL

## 2019-01-31 DIAGNOSIS — I25110 Atherosclerotic heart disease of native coronary artery with unstable angina pectoris: Secondary | ICD-10-CM | POA: Diagnosis not present

## 2019-01-31 LAB — BASIC METABOLIC PANEL
Anion gap: 10 (ref 5–15)
BUN: 31 mg/dL — ABNORMAL HIGH (ref 6–20)
CO2: 27 mmol/L (ref 22–32)
Calcium: 8.1 mg/dL — ABNORMAL LOW (ref 8.9–10.3)
Chloride: 101 mmol/L (ref 98–111)
Creatinine, Ser: 5.18 mg/dL — ABNORMAL HIGH (ref 0.44–1.00)
GFR calc Af Amer: 10 mL/min — ABNORMAL LOW (ref >60)
GFR calc non Af Amer: 9 mL/min — ABNORMAL LOW (ref >60)
Glucose, Bld: 94 mg/dL (ref 70–99)
Potassium: 4.4 mmol/L (ref 3.5–5.1)
Sodium: 138 mmol/L (ref 135–145)

## 2019-01-31 LAB — RENAL FUNCTION PANEL
Albumin: 3.7 g/dL (ref 3.5–5.0)
Anion gap: 9 (ref 5–15)
BUN: 35 mg/dL — ABNORMAL HIGH (ref 6–20)
CO2: 27 mmol/L (ref 22–32)
Calcium: 7.7 mg/dL — ABNORMAL LOW (ref 8.9–10.3)
Chloride: 100 mmol/L (ref 98–111)
Creatinine, Ser: 5.82 mg/dL — ABNORMAL HIGH (ref 0.44–1.00)
GFR calc Af Amer: 9 mL/min — ABNORMAL LOW (ref >60)
GFR calc non Af Amer: 7 mL/min — ABNORMAL LOW (ref >60)
Glucose, Bld: 105 mg/dL — ABNORMAL HIGH (ref 70–99)
Phosphorus: 3.7 mg/dL (ref 2.5–4.6)
Potassium: 4.7 mmol/L (ref 3.5–5.1)
Sodium: 136 mmol/L (ref 135–145)

## 2019-01-31 LAB — CBC
HCT: 33.4 % — ABNORMAL LOW (ref 36.0–46.0)
HCT: 32.3 % — ABNORMAL LOW (ref 36.0–46.0)
Hemoglobin: 10.9 g/dL — ABNORMAL LOW (ref 12.0–15.0)
Hemoglobin: 10.5 g/dL — ABNORMAL LOW (ref 12.0–15.0)
MCH: 32.2 pg (ref 26.0–34.0)
MCH: 31.8 pg (ref 26.0–34.0)
MCHC: 32.6 g/dL (ref 30.0–36.0)
MCHC: 32.5 g/dL (ref 30.0–36.0)
MCV: 98.5 fL (ref 80.0–100.0)
MCV: 97.9 fL (ref 80.0–100.0)
Platelets: 146 10*3/uL — ABNORMAL LOW (ref 150–400)
Platelets: 156 10*3/uL (ref 150–400)
RBC: 3.39 MIL/uL — ABNORMAL LOW (ref 3.87–5.11)
RBC: 3.3 MIL/uL — ABNORMAL LOW (ref 3.87–5.11)
RDW: 15 % (ref 11.5–15.5)
RDW: 15.3 % (ref 11.5–15.5)
WBC: 2.9 10*3/uL — ABNORMAL LOW (ref 4.0–10.5)
WBC: 3.1 10*3/uL — ABNORMAL LOW (ref 4.0–10.5)
nRBC: 0 % (ref 0.0–0.2)
nRBC: 0 % (ref 0.0–0.2)

## 2019-01-31 MED ORDER — OXYCODONE-ACETAMINOPHEN 5-325 MG PO TABS
1.0000 | ORAL_TABLET | Freq: Four times a day (QID) | ORAL | Status: DC | PRN
  Administered 2019-01-31: 1 via ORAL

## 2019-01-31 NOTE — Discharge Summary (Signed)
[facility] NAME: [name]    MR#:  [id number]  DATE OF BIRTH:  11-Jan-1961  DATE OF ADMISSION:  01/30/2019 ADMITTING PHYSICIAN: Sela Hua, MD  DATE OF DISCHARGE: 01/31/2019  PRIMARY CARE PHYSICIAN: [name], MD    ADMISSION DIAGNOSIS:  LT Cath     Unstable angina  DISCHARGE DIAGNOSIS:  Principal Problem:   Chest pain  Active Problems:   ESRD (end stage renal disease) ([place])   SECONDARY DIAGNOSIS:   Past Medical History:  Diagnosis Date  . Anemia    chronic disease  . CHF (congestive heart failure) ([place])   . Coronary artery disease   . Heart failure ([place])   . Hepatitis    history of hep c  . Hyperlipidemia   . Hypertension   . Myocardial infarction ([place])   . Paroxysmal atrial fibrillation (HCC)   . Renal failure   . Renal insufficiency   . Stroke [facility]) 2011    HOSPITAL COURSE:  58 year old female with history of combined systolic and diastolic heart failure, end-stage renal disease on hemodialysis and PAF on Eliquis who presented to the emergency room after cardiac catheterization due to shortness of breath.  1.  Chest pain: Patient apparently had a recent abnormal stress test.  She is status post cardiac catheterization which was basically unchanged from previous.  No interventions performed.  Her chest pain is actually costochondral in nature.  She has significant tenderness at the costochondral area.  2.  End-stage renal disease on hemodialysis: Patient missed dialysis which is etiology of her shortness of breath.  She received dialysis while in the hospital.  Her shortness of breath has improved.  She will continue with outpatient dialysis schedule Monday, Wednesday and Friday.  3.  PAF: Her heart rate is controlled. She may continue amiodarone for heart rate control and Eliquis for stroke prevention.  4.  Chronic combined systolic and diastolic heart failure with EF of 35%: Patient will have  outpatient follow-up with her cardiologist.  She will continue Imdur and Entresto  5.  Hypothyroidism: Continue Synthroid   6. Tobacco dependence: Patient is encouraged to quit smoking and willing to attempt to quit was assessed. Patient slightly motivated.Counseling was provided for 4 minutes. She does not want patches at discharge.  DISCHARGE CONDITIONS AND DIET:   stable for discharge heart healthy renal dietsta CONSULTS OBTAINED:    DRUG ALLERGIES:   Allergies  Allergen Reactions  . Morphine And Related Hives  . Levaquin [Levofloxacin] Itching    Severe itching; prickly sensation  . Latex Rash  . Tape Rash    DISCHARGE MEDICATIONS:   Allergies as of 01/31/2019      Reactions   Morphine And Related Hives   Levaquin [levofloxacin] Itching   Severe itching; prickly sensation   Latex Rash   Tape Rash      Medication List    TAKE these medications   amiodarone 200 MG tablet Commonly known as: PACERONE Take 200 mg by mouth 2 (two) times daily.   aspirin 81 MG chewable tablet Chew 81 mg by mouth daily.   diclofenac sodium 1 % Gel Commonly known as: VOLTAREN Apply 4 g topically 4 (four) times daily.   Eliquis 5 MG Tabs tablet Generic drug: apixaban Take 1 tablet (5 mg total) by mouth 2 (two) times daily.   Entresto 49-51 MG Generic drug: sacubitril-valsartan Take 1 tablet by mouth 2 (two) times daily.   hydrOXYzine 50  MG tablet Commonly known as: ATARAX/VISTARIL Take 50 mg by mouth 2 (two) times daily as needed for itching.   isosorbide mononitrate 60 MG 24 hr tablet Commonly known as: IMDUR Take 60 mg by mouth daily.   levothyroxine 75 MCG tablet Commonly known as: SYNTHROID Take 75 mcg by mouth daily before breakfast.   midodrine 10 MG tablet Commonly known as: PROAMATINE Take 10-20 mg by mouth daily. (for low BP associated with dialysis)   oxyCODONE-acetaminophen 5-325 MG tablet Commonly known as: PERCOCET/ROXICET Take 1 tablet by mouth 2  (two) times daily as needed for moderate pain.   pantoprazole 40 MG tablet Commonly known as: PROTONIX Take 40 mg by mouth daily.   Sensipar 60 MG tablet Generic drug: cinacalcet Take 60 mg by mouth every evening.   sevelamer carbonate 800 MG tablet Commonly known as: RENVELA Take 2,400 mg by mouth 3 (three) times daily with meals. And 1 tablet with snacks         Today   CHIEF COMPLAINT:  Patient denies shortness of breath.  She is ready for discharge.   VITAL SIGNS:  Blood pressure 108/75, pulse 77, temperature 98.4 F (36.9 C), temperature source Oral, resp. rate 18, height 5\' 6"  (1.676 m), weight 70.3 kg, SpO2 97 %.   REVIEW OF SYSTEMS:  Review of Systems  Constitutional: Negative.  Negative for chills, fever and malaise/fatigue.  HENT: Negative.  Negative for ear discharge, ear pain, hearing loss, nosebleeds and sore throat.   Eyes: Negative.  Negative for blurred vision and pain.  Respiratory: Negative.  Negative for cough, hemoptysis, shortness of breath and wheezing.   Cardiovascular: Negative.  Negative for chest pain, palpitations and leg swelling.  Gastrointestinal: Negative.  Negative for abdominal pain, blood in stool, diarrhea, nausea and vomiting.  Genitourinary: Negative.  Negative for dysuria.  Musculoskeletal: Negative.  Negative for back pain.  Skin: Negative.   Neurological: Negative for dizziness, tremors, speech change, focal weakness, seizures and headaches.  Endo/Heme/Allergies: Negative.  Does not bruise/bleed easily.  Psychiatric/Behavioral: Negative.  Negative for depression, hallucinations and suicidal ideas.     PHYSICAL EXAMINATION:  GENERAL:  58 y.o.-year-old patient lying in the bed with no acute distress.  NECK:  Supple, no jugular venous distention. No thyroid enlargement, no tenderness.  LUNGS: Normal breath sounds bilaterally, no wheezing, rales,rhonchi  No use of accessory muscles of respiration.  CARDIOVASCULAR: S1, S2 normal.  No murmurs, rubs, or gallops.  ABDOMEN: Soft, non-tender, non-distended. Bowel sounds present. No organomegaly or mass.  EXTREMITIES: No pedal edema, cyanosis, or clubbing.  PSYCHIATRIC: The patient is alert and oriented x 3.  SKIN: No obvious rash, lesion, or ulcer.   DATA REVIEW:   CBC Recent Labs  Lab 01/31/19 0407  WBC 2.9*  HGB 10.9*  HCT 33.4*  PLT 146*    Chemistries  Recent Labs  Lab 01/30/19 1117 01/31/19 0407  NA 135 138  K 5.5* 4.4  CL 98 101  CO2 25 27  GLUCOSE 81 94  BUN 63* 31*  CREATININE 8.79* 5.18*  CALCIUM 8.9 8.1*  AST 14*  --   ALT 8  --   ALKPHOS 96  --   BILITOT 0.8  --     Cardiac Enzymes No results for input(s): TROPONINI in the last 168 hours.  Microbiology Results  @MICRORSLT48 @  RADIOLOGY:  No results found.    Allergies as of 01/31/2019      Reactions   Morphine And Related Hives   Levaquin [levofloxacin] Itching  Severe itching; prickly sensation   Latex Rash   Tape Rash      Medication List    TAKE these medications   amiodarone 200 MG tablet Commonly known as: PACERONE Take 200 mg by mouth 2 (two) times daily.   aspirin 81 MG chewable tablet Chew 81 mg by mouth daily.   diclofenac sodium 1 % Gel Commonly known as: VOLTAREN Apply 4 g topically 4 (four) times daily.   Eliquis 5 MG Tabs tablet Generic drug: apixaban Take 1 tablet (5 mg total) by mouth 2 (two) times daily.   Entresto 49-51 MG Generic drug: sacubitril-valsartan Take 1 tablet by mouth 2 (two) times daily.   hydrOXYzine 50 MG tablet Commonly known as: ATARAX/VISTARIL Take 50 mg by mouth 2 (two) times daily as needed for itching.   isosorbide mononitrate 60 MG 24 hr tablet Commonly known as: IMDUR Take 60 mg by mouth daily.   levothyroxine 75 MCG tablet Commonly known as: SYNTHROID Take 75 mcg by mouth daily before breakfast.   midodrine 10 MG tablet Commonly known as: PROAMATINE Take 10-20 mg by mouth daily. (for low BP associated  with dialysis)   oxyCODONE-acetaminophen 5-325 MG tablet Commonly known as: PERCOCET/ROXICET Take 1 tablet by mouth 2 (two) times daily as needed for moderate pain.   pantoprazole 40 MG tablet Commonly known as: PROTONIX Take 40 mg by mouth daily.   Sensipar 60 MG tablet Generic drug: cinacalcet Take 60 mg by mouth every evening.   sevelamer carbonate 800 MG tablet Commonly known as: RENVELA Take 2,400 mg by mouth 3 (three) times daily with meals. And 1 tablet with snacks         Management plans discussed with the patient and she is in agreement. Stable for discharge home  Patient should follow up with pcp  CODE STATUS:     Code Status Orders  (From admission, onward)         Start     Ordered   01/30/19 1401  Full code  Continuous     01/30/19 1400        Code Status History    Date Active Date Inactive Code Status Order ID Comments User Context   01/30/2019 1312 01/30/2019 1400 Full Code [id number]  Dionisio David, MD Inpatient   01/30/2019 1312 01/30/2019 1312 Full Code [id number]  Dionisio David, MD Inpatient   01/18/2019 1346 01/19/2019 2156 Full Code [id number]  Max Sane, MD Inpatient   08/24/2018 1639 08/25/2018 1816 Full Code [id number]  Henreitta Leber, MD Inpatient   08/06/2018 0344 08/09/2018 1639 Full Code [id number]  Harrie Foreman, MD Inpatient   06/02/2018 0039 06/04/2018 2144 Full Code [id number]  Arta Silence, MD Inpatient   04/21/2018 1808 04/22/2018 2210 Full Code [id number]  Dustin Flock, MD Inpatient   01/31/2018 1924 02/03/2018 0111 Full Code [id number]  Loletha Grayer, MD ED   11/13/2014 1659 11/15/2014 2152 Full Code [id number]  Hillary Bow, MD ED   10/16/2014 0823 10/17/2014 2333 Full Code [id number]  Lance Coon, MD Inpatient   Advance Care Planning Activity      TOTAL TIME TAKING CARE OF THIS PATIENT: 38 minutes.    Note: This dictation was prepared with Dragon dictation along with smaller phrase technology. Any transcriptional errors  that result from this process are unintentional.  Bettey Costa M.D on 01/31/2019 at 11:35 AM  Between 7am to 6pm - Pager - [phone] After 6pm go to [email or web address] - password [email or web address]  [employer]  Office  [phone]  CC: Primary care physician; [name], MD

## 2019-01-31 NOTE — Progress Notes (Signed)
Discharge instructions reviewed with the patient. Patient waiting on ride to pick her up

## 2019-01-31 NOTE — Progress Notes (Signed)
[facility]  ROUNDING NOTE   Subjective:   Extra hemodialysis treatment today. However patient infiltrated her AVF and does not want to continue   Objective:  Vital signs in last 24 hours:  Temp:  [97 F (36.1 C)-98.6 F (37 C)] 97.7 F (36.5 C) (08/18 1237) Pulse Rate:  [49-92] 92 (08/18 1237) Resp:  [12-28] 20 (08/18 1237) BP: (81-129)/(53-110) 119/101 (08/18 1237) SpO2:  [91 %-100 %] 91 % (08/18 1237)  Weight change:  Filed Weights   01/30/19 1032  Weight: 70.3 kg    Intake/Output: I/O last 3 completed shifts: In: -  Out: 2066 [Other:2066]   Intake/Output this shift:  No intake/output data recorded.  Physical Exam: General: NAD, sitting in chair  Head: Normocephalic, atraumatic. Moist oral mucosal membranes  Eyes: Anicteric, PERRL  Neck: Supple, trachea midline  Lungs:  Clear to auscultation  Heart: Regular rate and rhythm  Abdomen:  Soft, nontender,   Extremities:  + peripheral edema.  Neurologic: Nonfocal, moving all four extremities  Skin: No lesions  Access: Left AVF    Basic Metabolic Panel: Recent Labs  Lab 01/30/19 1117 01/31/19 0407  NA 135 138  K 5.5* 4.4  CL 98 101  CO2 25 27  GLUCOSE 81 94  BUN 63* 31*  CREATININE 8.79* 5.18*  CALCIUM 8.9 8.1*    Liver Function Tests: Recent Labs  Lab 01/30/19 1117  AST 14*  ALT 8  ALKPHOS 96  BILITOT 0.8  PROT 7.7  ALBUMIN 4.1   No results for input(s): LIPASE, AMYLASE in the last 168 hours. No results for input(s): AMMONIA in the last 168 hours.  CBC: Recent Labs  Lab 01/31/19 0407  WBC 2.9*  HGB 10.9*  HCT 33.4*  MCV 98.5  PLT 146*    Cardiac Enzymes: No results for input(s): CKTOTAL, CKMB, CKMBINDEX, TROPONINI in the last 168 hours.  BNP: Invalid input(s): POCBNP  CBG: No results for input(s): GLUCAP in the last 168 hours.  Microbiology: Results for orders placed or performed during the hospital encounter of 01/26/19  SARS CORONAVIRUS 2 Nasal Swab Aptima  Multi Swab     Status: None   Collection Time: 01/26/19 11:12 AM   Specimen: Aptima Multi Swab; Nasal Swab  Result Value Ref Range Status   SARS Coronavirus 2 NEGATIVE NEGATIVE Final    Comment: (NOTE) SARS-CoV-2 target nucleic acids are NOT DETECTED. The SARS-CoV-2 RNA is generally detectable in upper and lower respiratory specimens during the acute phase of infection. Negative results do not preclude SARS-CoV-2 infection, do not rule out co-infections with other pathogens, and should not be used as the sole basis for treatment or other patient management decisions. Negative results must be combined with clinical observations, patient history, and epidemiological information. The expected result is Negative. Fact Sheet for Patients: SugarRoll.be Fact Sheet for Healthcare Providers: https://www.woods-mathews.com/ This test is not yet approved or cleared by the [place] FDA and  has been authorized for detection and/or diagnosis of SARS-CoV-2 by FDA under an Emergency Use Authorization (EUA). This EUA will remain  in effect (meaning this test can be used) for the duration of the COVID-19 declaration under Section 56 4(b)(1) of the Act, 21 U.S.C. section 360bbb-3(b)(1), unless the authorization is terminated or revoked sooner. Performed at [facility], [place] [address] [place], [place] [address]     Coagulation Studies: No results for input(s): LABPROT, INR in the last 72 hours.  Urinalysis: No results for input(s): COLORURINE, LABSPEC, [place], [place], [place],  BILIRUBINUR, KETONESUR, PROTEINUR, UROBILINOGEN, NITRITE, LEUKOCYTESUR in the last 72 hours.  Invalid input(s): APPERANCEUR    Imaging: No results found.   Medications:    . amiodarone  200 mg Oral BID  . apixaban  5 mg Oral BID  . aspirin  81 mg Oral Daily  . Chlorhexidine Gluconate Cloth  6 each Topical Q0600  . Chlorhexidine Gluconate Cloth  6 each Topical  Q0600  . cinacalcet  60 mg Oral QPM  . diclofenac sodium  4 g Topical QID  . ipratropium-albuterol  3 mL Nebulization Q6H  . isosorbide mononitrate  60 mg Oral Daily  . levothyroxine  75 mcg Oral QAC breakfast  . lidocaine-prilocaine   Topical Once  . midodrine  10-20 mg Oral Daily  . mupirocin ointment  1 application Nasal BID  . pantoprazole  40 mg Oral Daily  . sacubitril-valsartan  1 tablet Oral BID  . sevelamer carbonate  2,400 mg Oral TID WC   acetaminophen **OR** acetaminophen, hydrOXYzine, ondansetron **OR** ondansetron (ZOFRAN) IV, oxyCODONE-acetaminophen, polyethylene glycol, sevelamer carbonate  Assessment/ Plan:  Ms. ZILLAH ALEXIE is a 58 y.o. black female with end stage renal disease on hemodialysis, coronary artery disease status post CABG, hypertension, CVA, atrial fibrillation, congestive heart failure, pulmonary embolism presents for cardiac catheterization with abnormal stress test. Catheterization with no amendable lesions. Recommending medical mangement.   [address] MWF 70kg   1. End Stage Renal Disease:   - Resume MWF schedule, treatment for tomorrow.   2. Anemia with chronic kidney disease:  - EPO as outpatient.   3. Secondary Hyperparathyroidism:  -  cinacalcet and sevelamer  4. Hypotension - midodrine   LOS: 0 Kelissa Merlin 8/18/20201:15 PM

## 2019-01-31 NOTE — Progress Notes (Signed)
Patient requesting percocet, it was not time for her to have one. Spoke with Dr. Benjie Karvonen about this, Dr. Benjie Karvonen changed her frequency to q6h.

## 2019-01-31 NOTE — Progress Notes (Signed)
Established hemodialysis patient known at [facility] MWF 5:45, patient rides with [facility]. Please note any change in covid and mobility status may affect this plan. Please contact me directly for any dialysis placement concerns.  Elvera Bicker Dialysis Coordinator [phone]

## 2019-02-21 ENCOUNTER — Other Ambulatory Visit: Payer: Self-pay

## 2019-02-21 ENCOUNTER — Other Ambulatory Visit
Admission: RE | Admit: 2019-02-21 | Discharge: 2019-02-21 | Disposition: A | Discharge status: Home / Self Care | Payer: Medicare Other | Source: Ambulatory Visit | Attending: Gastroenterology | Admitting: Gastroenterology

## 2019-02-21 DIAGNOSIS — Z20828 Contact with and (suspected) exposure to other viral communicable diseases: Secondary | ICD-10-CM | POA: Insufficient documentation

## 2019-02-21 DIAGNOSIS — Z01812 Encounter for preprocedural laboratory examination: Secondary | ICD-10-CM | POA: Diagnosis not present

## 2019-02-22 LAB — SARS CORONAVIRUS 2 (TAT 6-24 HRS): SARS Coronavirus 2: NEGATIVE

## 2019-02-23 ENCOUNTER — Ambulatory Visit
Admission: RE | Admit: 2019-02-23 | Discharge: 2019-02-23 | Disposition: A | Discharge status: Home / Self Care | Payer: Medicare Other | Attending: Gastroenterology | Admitting: Gastroenterology

## 2019-02-23 ENCOUNTER — Ambulatory Visit: Payer: Medicare Other

## 2019-02-23 ENCOUNTER — Encounter: Payer: Self-pay | Admitting: Anesthesiology

## 2019-02-23 ENCOUNTER — Encounter
Admission: RE | Disposition: A | Discharge status: Home / Self Care | Payer: Self-pay | Source: Home / Self Care | Attending: Gastroenterology

## 2019-02-23 ENCOUNTER — Ambulatory Visit: Payer: Medicare Other | Admitting: Anesthesiology

## 2019-02-23 ENCOUNTER — Ambulatory Visit
Admission: RE | Admit: 2019-02-23 | Discharge: 2019-02-23 | Disposition: A | Discharge status: Home / Self Care | Payer: Medicare Other | Source: Home / Self Care | Attending: Gastroenterology | Admitting: Gastroenterology

## 2019-02-23 ENCOUNTER — Other Ambulatory Visit: Payer: Self-pay

## 2019-02-23 DIAGNOSIS — N186 End stage renal disease: Secondary | ICD-10-CM | POA: Diagnosis not present

## 2019-02-23 DIAGNOSIS — Z7982 Long term (current) use of aspirin: Secondary | ICD-10-CM | POA: Insufficient documentation

## 2019-02-23 DIAGNOSIS — I252 Old myocardial infarction: Secondary | ICD-10-CM | POA: Diagnosis not present

## 2019-02-23 DIAGNOSIS — Z79899 Other long term (current) drug therapy: Secondary | ICD-10-CM | POA: Insufficient documentation

## 2019-02-23 DIAGNOSIS — K635 Polyp of colon: Secondary | ICD-10-CM | POA: Diagnosis not present

## 2019-02-23 DIAGNOSIS — Z992 Dependence on renal dialysis: Secondary | ICD-10-CM | POA: Diagnosis not present

## 2019-02-23 DIAGNOSIS — I2510 Atherosclerotic heart disease of native coronary artery without angina pectoris: Secondary | ICD-10-CM | POA: Diagnosis not present

## 2019-02-23 DIAGNOSIS — R195 Other fecal abnormalities: Secondary | ICD-10-CM | POA: Insufficient documentation

## 2019-02-23 DIAGNOSIS — Z7901 Long term (current) use of anticoagulants: Secondary | ICD-10-CM | POA: Insufficient documentation

## 2019-02-23 DIAGNOSIS — I132 Hypertensive heart and chronic kidney disease with heart failure and with stage 5 chronic kidney disease, or end stage renal disease: Secondary | ICD-10-CM | POA: Insufficient documentation

## 2019-02-23 DIAGNOSIS — I5020 Unspecified systolic (congestive) heart failure: Secondary | ICD-10-CM | POA: Insufficient documentation

## 2019-02-23 DIAGNOSIS — K5900 Constipation, unspecified: Secondary | ICD-10-CM

## 2019-02-23 DIAGNOSIS — Z951 Presence of aortocoronary bypass graft: Secondary | ICD-10-CM | POA: Diagnosis not present

## 2019-02-23 HISTORY — PX: FLEXIBLE SIGMOIDOSCOPY: SHX5431

## 2019-02-23 LAB — GLUCOSE, CAPILLARY
Glucose-Capillary: 60 mg/dL — ABNORMAL LOW (ref 70–99)
Glucose-Capillary: 59 mg/dL — ABNORMAL LOW (ref 70–99)

## 2019-02-23 SURGERY — SIGMOIDOSCOPY, FLEXIBLE
Anesthesia: General

## 2019-02-23 MED ORDER — SODIUM CHLORIDE 0.9 % IV SOLN: INTRAVENOUS | Status: DC

## 2019-02-23 MED ORDER — FENTANYL CITRATE (PF) 100 MCG/2ML IJ SOLN
INTRAMUSCULAR | Status: AC
  Filled 2019-02-23: qty 2

## 2019-02-23 MED ORDER — DEXTROSE-NACL 5-0.9 % IV SOLN
INTRAVENOUS | Status: DC | PRN
  Administered 2019-02-23: 14:00:00 via INTRAVENOUS

## 2019-02-23 MED ORDER — MIDAZOLAM HCL 2 MG/2ML IJ SOLN
INTRAMUSCULAR | Status: AC
  Filled 2019-02-23: qty 2

## 2019-02-23 MED ORDER — MIDAZOLAM HCL 2 MG/2ML IJ SOLN
INTRAMUSCULAR | Status: DC | PRN
  Administered 2019-02-23: .5 mg via INTRAVENOUS

## 2019-02-23 MED ORDER — PROPOFOL 500 MG/50ML IV EMUL
INTRAVENOUS | Status: DC | PRN
  Administered 2019-02-23: 180 ug/kg/min via INTRAVENOUS

## 2019-02-23 MED ORDER — EPHEDRINE SULFATE 50 MG/ML IJ SOLN
INTRAMUSCULAR | Status: DC | PRN
  Administered 2019-02-23: 10 mg via INTRAVENOUS

## 2019-02-23 MED ORDER — SODIUM CHLORIDE 0.9 % IV SOLN
INTRAVENOUS | Status: DC
  Administered 2019-02-23: 14:00:00 via INTRAVENOUS

## 2019-02-23 MED ORDER — PROPOFOL 10 MG/ML IV BOLUS
INTRAVENOUS | Status: AC
  Filled 2019-02-23: qty 20

## 2019-02-23 MED ORDER — PHENYLEPHRINE HCL (PRESSORS) 10 MG/ML IV SOLN
INTRAVENOUS | Status: DC | PRN
  Administered 2019-02-23: 100 ug via INTRAVENOUS

## 2019-02-23 MED ORDER — LIDOCAINE HCL (PF) 2 % IJ SOLN
INTRAMUSCULAR | Status: AC
  Filled 2019-02-23: qty 10

## 2019-02-23 MED ORDER — FENTANYL CITRATE (PF) 100 MCG/2ML IJ SOLN
INTRAMUSCULAR | Status: DC | PRN
  Administered 2019-02-23: 25 ug via INTRAVENOUS

## 2019-02-23 MED ORDER — FLEET ENEMA 7-19 GM/118ML RE ENEM
1.0000 | ENEMA | Freq: Once | RECTAL | Status: AC
  Administered 2019-02-23: 12:00:00 1 via RECTAL

## 2019-02-23 MED ORDER — PROPOFOL 10 MG/ML IV BOLUS
INTRAVENOUS | Status: DC | PRN
  Administered 2019-02-23: 50 mg via INTRAVENOUS
  Administered 2019-02-23: 10 mg via INTRAVENOUS
  Administered 2019-02-23: 20 mg via INTRAVENOUS

## 2019-02-23 NOTE — Anesthesia Post-op Follow-up Note (Signed)
Anesthesia QCDR form completed.        

## 2019-02-23 NOTE — Progress Notes (Signed)
Additional 239ml Tap H2O enema given to patient will small amount of soft brown stool evacuated.  Dr. Gustavo Lah aware and will wait for further instructions.

## 2019-02-23 NOTE — OR Nursing (Signed)
FS BG=60 mg/dl. Pt given ginger ale to drink.

## 2019-02-23 NOTE — Anesthesia Postprocedure Evaluation (Signed)
Anesthesia Post Note  Patient: [name]  Procedure(s) Performed: FLEXIBLE SIGMOIDOSCOPY (N/A )  Patient location during evaluation: Endoscopy Anesthesia Type: General Level of consciousness: awake and alert Pain management: pain level controlled Vital Signs Assessment: post-procedure vital signs reviewed and stable Respiratory status: spontaneous breathing, nonlabored ventilation, respiratory function stable and patient connected to nasal cannula oxygen Cardiovascular status: blood pressure returned to baseline and stable Postop Assessment: no apparent nausea or vomiting Anesthetic complications: no     Last Vitals:  Vitals:   02/23/19 1522 02/23/19 1532  BP: 98/63 118/68  Pulse: (!) 54 (!) 54  Resp: (!) 25 15  Temp:    SpO2: 99% 100%    Last Pain:  Vitals:   02/23/19 1532  TempSrc:   PainSc: 0-No pain                 Kendre Jacinto S

## 2019-02-23 NOTE — H&P (Signed)
Outpatient short stay form Pre-procedure 02/23/2019 1:36 PM Vanessa Sails MD  Primary Physician: Dr. Lamonte Sakai  Reason for visit: Sedated flexible sigmoidoscopy  History of present illness: Patient is a 58 year old female presenting today for further evaluation regards to abnormal Cologuard.  She had a positive result on a Cologuard dated 05/26/2018.  She has had multiple issues with coronary artery disease and NSTEMI in the interim.  She has multiple system disease including end-stage renal disease with hemodialysis.  Does take Eliquis which she has held for about 4 days.  Patient was to do her prep last night.  She called early this morning stated that she had drank the entire colonoscopy type prep and had not had any bowel movements.  She was brought to the hospital and x-rays were done rule out obstruction, this showing an enlargement of the cardiac silhouette with pulmonary vascular congestion post CABG no definite acute infiltrates nonobstructive bowel gas pattern and a possible right renal calculus.  Was then brought to endoscopy.  On examination she was noted to be quite distended.  There is possibility of some amount of fecal impaction on rectal examination.  Subsequently she was given a fleets enema and tap water enema.  She is feeling much better currently he is much less distended on physical examination.  Repeat anorectal examination/DRE shows the vault to be empty.  She did have a CT colonoscopy done due to the clinical situation that being done 12/20/2018.  There was a possible 14 mm flat polypoid lesion along the right lateral aspect of the mid to upper rectum proximately 12 cm from the anal verge.  With her having a actively clear rectum at this point we will proceed with flexible sigmoidoscopy.    Current Facility-Administered Medications:  .  0.9 %  sodium chloride infusion, , Intravenous, Continuous, Vanessa Sails, MD .  0.9 %  sodium chloride infusion, , Intravenous,  Continuous, Vanessa Sails, MD  Facility-Administered Medications Ordered in Other Encounters:  .  sodium chloride flush (NS) 0.9 % injection 3 mL, 3 mL, Intravenous, Q12H, Vanessa David, MD  Medications Prior to Admission  Medication Sig Dispense Refill Last Dose  . amiodarone (PACERONE) 200 MG tablet Take 200 mg by mouth 2 (two) times daily.   Past Week at Unknown time  . aspirin 81 MG chewable tablet Chew 81 mg by mouth daily.   Past Week at Unknown time  . carvedilol (COREG) 12.5 MG tablet Take 12.5 mg by mouth 2 (two) times daily with a meal.   Past Week at Unknown time  . cinacalcet (SENSIPAR) 60 MG tablet Take 60 mg by mouth every evening.    Past Week at Unknown time  . diclofenac sodium (VOLTAREN) 1 % GEL Apply 4 g topically 4 (four) times daily. 100 g 3 Past Week at Unknown time  . ELIQUIS 5 MG TABS tablet Take 1 tablet (5 mg total) by mouth 2 (two) times daily. 60 tablet 0 Past Week at Unknown time  . hydrOXYzine (ATARAX/VISTARIL) 50 MG tablet Take 50 mg by mouth 2 (two) times daily as needed for itching.    Past Week at Unknown time  . isosorbide mononitrate (IMDUR) 60 MG 24 hr tablet Take 60 mg by mouth daily.    Past Week at Unknown time  . levothyroxine (SYNTHROID) 75 MCG tablet Take 75 mcg by mouth daily before breakfast.    Past Week at Unknown time  . midodrine (PROAMATINE) 10 MG tablet Take 10-20 mg  by mouth daily. (for low BP associated with dialysis)   Past Week at Unknown time  . oxyCODONE-acetaminophen (PERCOCET/ROXICET) 5-325 MG tablet Take 1 tablet by mouth 2 (two) times daily as needed for moderate pain.    Past Week at Unknown time  . pantoprazole (PROTONIX) 40 MG tablet Take 40 mg by mouth daily.   Past Week at Unknown time  . sacubitril-valsartan (ENTRESTO) 49-51 MG Take 1 tablet by mouth 2 (two) times daily.    Past Week at Unknown time  . sevelamer carbonate (RENVELA) 800 MG tablet Take 2,400 mg by mouth 3 (three) times daily with meals. And 1 tablet with  snacks   Past Week at Unknown time  . sucralfate (CARAFATE) 1 g tablet Take 1 g by mouth 2 (two) times daily.   Past Week at Unknown time     Allergies  Allergen Reactions  . Morphine And Related Hives  . Levaquin [Levofloxacin] Itching    Severe itching; prickly sensation  . Latex Rash  . Tape Rash     Past Medical History:  Diagnosis Date  . Anemia    chronic disease  . CHF (congestive heart failure) ([place])   . Coronary artery disease   . Heart failure ([place])   . Hepatitis    history of hep c  . Hyperlipidemia   . Hypertension   . Myocardial infarction ([place])   . Paroxysmal atrial fibrillation (HCC)   . Renal failure   . Renal insufficiency   . Stroke [facility]) 2011    Review of systems:      Physical Exam    Heart and lungs: Regular rate and rhythm without rub or gallop lungs are clear    HEENT: Normocephalic atraumatic eyes show a minimal scleral icterus.    Other:    Pertinant exam for procedure: Soft mildly protuberant bowel sounds positive normoactive but sparse.  There are no masses or rebound.    Planned proceedures: Flexible sigmoidoscopy and indicated procedures. I have discussed the risks benefits and complications of procedures to include not limited to bleeding, infection, perforation and the risk of sedation and the patient wishes to proceed.    Vanessa Sails, MD Gastroenterology 02/23/2019  1:36 PM

## 2019-02-23 NOTE — Anesthesia Preprocedure Evaluation (Addendum)
Anesthesia Evaluation  Patient identified by MRN, date of birth, ID band Patient awake    Reviewed: Allergy & Precautions, H&P , NPO status , Patient's Chart, lab work & pertinent test results  Airway Mallampati: II  TM Distance: >3 FB     Dental  (+) Chipped   Pulmonary shortness of breath, with exertion and Long-Term Oxygen Therapy, Current Smoker,  Wears 2L [place] when lying flat          Cardiovascular hypertension, (-) angina (denies any recent angina)+ CAD, + Past MI, + CABG and +CHF (HFrEF 28%)  + dysrhythmias Atrial Fibrillation   LHC 01/30/19:   Prox RCA lesion is 100% stenosed.  Mid LAD lesion is 100% stenosed.  Prox Cx to Mid Cx lesion is 90% stenosed.   All grafts patent, including LIMA to LAD, SVG to Om and PDA, with extesive worsening of native disease. Echo had EF 28%. Treat medically   Neuro/Psych CVA    GI/Hepatic negative GI ROS, (+) Hepatitis -, C  Endo/Other  negative endocrine ROS  Renal/GU ESRF and DialysisRenal diseaseDialyzed yesterday  negative genitourinary   Musculoskeletal   Abdominal   Peds  Hematology  (+) Blood dyscrasia, anemia ,   Anesthesia Other Findings Past Medical History: No date: Anemia     Comment:  chronic disease No date: CHF (congestive heart failure) (HCC) No date: Coronary artery disease No date: Heart failure (HCC) No date: Hepatitis     Comment:  history of hep c No date: Hyperlipidemia No date: Hypertension No date: Myocardial infarction ([place]) No date: Paroxysmal atrial fibrillation ([place]) No date: Renal failure No date: Renal insufficiency 2011: Stroke [facility])  Past Surgical History: 2013: CORONARY ANGIOPLASTY WITH STENT PLACEMENT No date: CORONARY ARTERY BYPASS GRAFT No date: DIALYSIS FISTULA CREATION 08/08/2018: LEFT HEART CATH AND CORS/GRAFTS ANGIOGRAPHY; N/A     Comment:  Procedure: LEFT HEART CATH AND CORS/GRAFTS ANGIOGRAPHY;               Surgeon:  Dionisio David, MD;  Location: [place] [facility]              [facility];  Service: Cardiovascular;  Laterality: N/A; 01/30/2019: LEFT HEART CATH AND CORS/GRAFTS ANGIOGRAPHY; N/A     Comment:  Procedure: LEFT HEART CATH AND CORS/GRAFTS ANGIOGRAPHY;               Surgeon: Dionisio David, MD;  Location: [place] [facility]              [facility];  Service: Cardiovascular;  Laterality: N/A;  BMI    Body Mass Index: 24.37 kg/m      Reproductive/Obstetrics negative OB ROS                            Anesthesia Physical Anesthesia Plan  ASA: IV  Anesthesia Plan: General   Post-op Pain Management:    Induction: Intravenous  PONV Risk Score and Plan: Propofol infusion and TIVA  Airway Management Planned: Natural Airway and Nasal Cannula  Additional Equipment:   Intra-op Plan:   Post-operative Plan:   Informed Consent: I have reviewed the patients History and Physical, chart, labs and discussed the procedure including the risks, benefits and alternatives for the proposed anesthesia with the patient or authorized representative who has indicated his/her understanding and acceptance.     Dental Advisory Given  Plan Discussed with: Anesthesiologist and CRNA  Anesthesia Plan Comments:        Anesthesia  Quick Evaluation

## 2019-02-23 NOTE — OR Nursing (Addendum)
Spoke with Dr. Marcello Moores regarding pt's BG of 59 after drinking regular ginger ale,pt denies signs or symptoms of hypoglycemia, pt is not a diabetic.  OK to dc home, be sure to encourage pt to eat and drink according to her diet.  IV dc'd from pt's right arm, site without redness or swelling.

## 2019-02-23 NOTE — Progress Notes (Signed)
Fleets enema given per Dr. Gustavo Lah request.  Per patient, minimal results with three pieces of stool expelled.  Will inform Dr. Gustavo Lah for further instruction.

## 2019-02-23 NOTE — Transfer of Care (Signed)
Immediate Anesthesia Transfer of Care Note  Patient: [name]  Procedure(s) Performed: FLEXIBLE SIGMOIDOSCOPY (N/A )  Patient Location: PACU  Anesthesia Type:General  Level of Consciousness: sedated  Airway & Oxygen Therapy: Patient Spontanous Breathing and Patient connected to nasal cannula oxygen  Post-op Assessment: Report given to RN and Post -op Vital signs reviewed and stable  Post vital signs: Reviewed and stable  Last Vitals:  Vitals Value Taken Time  BP 91/56 02/23/19 1512  Temp 36.1 C 02/23/19 1512  Pulse 55 02/23/19 1515  Resp 17 02/23/19 1515  SpO2 100 % 02/23/19 1515  Vitals shown include unvalidated device data.  Last Pain:  Vitals:   02/23/19 1512  TempSrc: Tympanic  PainSc: Asleep         Complications: No apparent anesthesia complications

## 2019-02-23 NOTE — Op Note (Signed)
[facility] Patient Name: [name] Procedure Date: 02/23/2019 1:20 PM MRN: [id number] Account #: [id number] Date of Birth: Jun 13, 1961 Admit Type: Outpatient Age: 58 Room: [facility] ENDO ROOM 3 Gender: Female Note Status: Finalized Procedure:            Flexible Sigmoidoscopy Indications:          abnormal cologard and abnormal CT colonography. Providers:            Lollie Sails, MD Complications:        No immediate complications. Procedure:            Pre-Anesthesia Assessment:                       - ASA Grade Assessment: IV - A patient with severe                        systemic disease that is a constant threat to life.                       After obtaining informed consent, the scope was passed                        under direct vision. The Colonoscope was introduced                        through the anus and advanced to the 25 cm from the                        anal verge. The patient tolerated the procedure well.                        The quality of the bowel preparation was good up to                        25-30 cm from the anal verge. Below this area, the                        colon was well prepped. The flexible sigmoidoscopy was                        accomplished without difficulty. Findings:      There is normal colon through about 25-28 cm from the anal verge. I       could finjd no lesion as described on the CT colonography.      A 2 mm polyp was found in the rectum. The polyp was sessile. The polyp       was removed with a cold biopsy forceps. Resection and retrieval were       complete. Retroflex evaluation in the distal retal vault was not       possible due to narrowness of the vault, however multiple radial passes       through the area were normal.      The digital rectal exam was normal. Impression:           - One 2 mm polyp in the rectum, removed with a cold                        biopsy forceps. Resected and  retrieved.  Recommendation:       - Await pathology results.                       - Return to GI clinic in 3 weeks. Procedure Code(s):    --- Professional ---                       [phone], Sigmoidoscopy, flexible; with biopsy, single or                        multiple Diagnosis Code(s):    --- Professional ---                       K62.1, Rectal polyp CPT copyright 2019 American Medical Association. All rights reserved. The codes documented in this report are preliminary and upon coder review may  be revised to meet current compliance requirements. Lollie Sails, MD 02/23/2019 3:15:06 PM This report has been signed electronically. Number of Addenda: 0 Note Initiated On: 02/23/2019 1:20 PM Total Procedure Duration: 0 hours 7 minutes 58 seconds       [facility]

## 2019-02-23 NOTE — Anesthesia Procedure Notes (Signed)
Date/Time: 02/23/2019 2:20 PM Performed by: Allean Found, CRNA Pre-anesthesia Checklist: Patient identified, Emergency Drugs available, Suction available, Patient being monitored and Timeout performed Patient Re-evaluated:Patient Re-evaluated prior to induction Oxygen Delivery Method: Nasal cannula Placement Confirmation: positive ETCO2

## 2019-02-23 NOTE — Progress Notes (Signed)
Tap H2O (589ml) enema given at the bedside in ENDO.  Small amount of formed brown stool evacuated.  Will attempt to give additional 514ml, if patient tolerates.

## 2019-02-24 ENCOUNTER — Encounter: Payer: Self-pay | Admitting: Gastroenterology

## 2019-02-24 LAB — POCT I-STAT 4, (NA,K, GLUC, HGB,HCT)
Glucose, Bld: 63 mg/dL — ABNORMAL LOW (ref 70–99)
HCT: 39 % (ref 36.0–46.0)
Hemoglobin: 13.3 g/dL (ref 12.0–15.0)
Potassium: 5.3 mmol/L — ABNORMAL HIGH (ref 3.5–5.1)
Sodium: 138 mmol/L (ref 135–145)

## 2019-02-27 LAB — SURGICAL PATHOLOGY

## 2019-03-22 ENCOUNTER — Other Ambulatory Visit (INDEPENDENT_AMBULATORY_CARE_PROVIDER_SITE_OTHER): Payer: Self-pay | Admitting: Nurse Practitioner

## 2019-03-22 DIAGNOSIS — N186 End stage renal disease: Secondary | ICD-10-CM

## 2019-03-23 ENCOUNTER — Other Ambulatory Visit: Payer: Self-pay

## 2019-03-23 ENCOUNTER — Encounter (INDEPENDENT_AMBULATORY_CARE_PROVIDER_SITE_OTHER): Payer: Self-pay | Admitting: Nurse Practitioner

## 2019-03-23 ENCOUNTER — Ambulatory Visit (INDEPENDENT_AMBULATORY_CARE_PROVIDER_SITE_OTHER): Payer: Medicare Other

## 2019-03-23 ENCOUNTER — Ambulatory Visit (INDEPENDENT_AMBULATORY_CARE_PROVIDER_SITE_OTHER): Payer: Medicare Other | Admitting: Nurse Practitioner

## 2019-03-23 ENCOUNTER — Telehealth (INDEPENDENT_AMBULATORY_CARE_PROVIDER_SITE_OTHER): Payer: Self-pay

## 2019-03-23 VITALS — BP 135/89 | HR 80 | Resp 16 | Wt 155.6 lb

## 2019-03-23 DIAGNOSIS — I10 Essential (primary) hypertension: Secondary | ICD-10-CM

## 2019-03-23 DIAGNOSIS — N186 End stage renal disease: Secondary | ICD-10-CM | POA: Diagnosis not present

## 2019-03-23 DIAGNOSIS — I200 Unstable angina: Secondary | ICD-10-CM

## 2019-03-23 DIAGNOSIS — I2510 Atherosclerotic heart disease of native coronary artery without angina pectoris: Secondary | ICD-10-CM | POA: Diagnosis not present

## 2019-03-23 NOTE — Telephone Encounter (Signed)
Spoke with the patient and she is now scheduled with Dr. Lucky Cowboy for angio on 03/30/2019 with a 6:45 am arrival time to the [facility]. Patient will do her Covid testing on 03/27/2019 between 12:30-2:30 pm at the [place] [facility]. Pre-procedure instructions were discussed and will be faxed to the patient's dialysis center on [address].

## 2019-03-27 ENCOUNTER — Other Ambulatory Visit: Payer: Self-pay

## 2019-03-27 ENCOUNTER — Other Ambulatory Visit
Admission: RE | Admit: 2019-03-27 | Discharge: 2019-03-27 | Disposition: A | Discharge status: Home / Self Care | Payer: Medicare Other | Source: Ambulatory Visit | Attending: Vascular Surgery | Admitting: Vascular Surgery

## 2019-03-27 ENCOUNTER — Encounter (INDEPENDENT_AMBULATORY_CARE_PROVIDER_SITE_OTHER): Payer: Self-pay | Admitting: Nurse Practitioner

## 2019-03-27 DIAGNOSIS — Z20828 Contact with and (suspected) exposure to other viral communicable diseases: Secondary | ICD-10-CM | POA: Insufficient documentation

## 2019-03-27 DIAGNOSIS — Z01812 Encounter for preprocedural laboratory examination: Secondary | ICD-10-CM | POA: Insufficient documentation

## 2019-03-27 LAB — SARS CORONAVIRUS 2 (TAT 6-24 HRS): SARS Coronavirus 2: NEGATIVE

## 2019-03-27 NOTE — Progress Notes (Signed)
SUBJECTIVE:  Patient ID: [name], female    DOB: 1960-11-04, 58 y.o.   MRN: [id number] Chief Complaint  Patient presents with  . Follow-up    ultrasound follow up    HPI  [name] is a 58 y.o. female The patient returns to the office for follow up regarding follow-up for dialysis access.  The patient notes a significant increase in bleeding time after decannulation.  The patient generally holds pressure for 15 to 30 minutes.  She also endorses bleeding up to 12 hours later after she removes the dressing.  The patient has also been informed that there is increased recirculation.    The patient denies hand pain or other symptoms consistent with steal phenomena.  No significant arm swelling.  The patient denies redness or swelling at the access site. The patient denies fever or chills at home or while on dialysis.  The patient denies amaurosis fugax or recent TIA symptoms. There are no recent neurological changes noted. The patient denies claudication symptoms or rest pain symptoms. The patient denies history of DVT, PE or superficial thrombophlebitis. The patient denies recent episodes of angina or shortness of breath.   The patient has a flow volume of 1800.  The left upper arm brachiobasilic AV fistula with a jump graft appears to be patent throughout.  There is a hematoma seen in the left upper arm mid-medially measuring 1.62 cm x 2.1 cm however it does not contain any flow.     Past Medical History:  Diagnosis Date  . Anemia    chronic disease  . CHF (congestive heart failure) ([place])   . Coronary artery disease   . Heart failure ([place])   . Hepatitis    history of hep c  . Hyperlipidemia   . Hypertension   . Myocardial infarction ([place])   . Paroxysmal atrial fibrillation (HCC)   . Renal failure   . Renal insufficiency   . Stroke [facility]) 2011    Past Surgical History:  Procedure Laterality Date  . CORONARY ANGIOPLASTY WITH STENT PLACEMENT  2013  . CORONARY  ARTERY BYPASS GRAFT    . DIALYSIS FISTULA CREATION    . FLEXIBLE SIGMOIDOSCOPY N/A 02/23/2019   Procedure: FLEXIBLE SIGMOIDOSCOPY;  Surgeon: Lollie Sails, MD;  Location: [facility] ENDOSCOPY;  Service: Endoscopy;  Laterality: N/A;  . LEFT HEART CATH AND CORS/GRAFTS ANGIOGRAPHY N/A 08/08/2018   Procedure: LEFT HEART CATH AND CORS/GRAFTS ANGIOGRAPHY;  Surgeon: Dionisio David, MD;  Location: [place] [facility];  Service: Cardiovascular;  Laterality: N/A;  . LEFT HEART CATH AND CORS/GRAFTS ANGIOGRAPHY N/A 01/30/2019   Procedure: LEFT HEART CATH AND CORS/GRAFTS ANGIOGRAPHY;  Surgeon: Dionisio David, MD;  Location: [place] [facility];  Service: Cardiovascular;  Laterality: N/A;    Social History   Socioeconomic History  . Marital status: Single    Spouse name: Not on file  . Number of children: Not on file  . Years of education: Not on file  . Highest education level: Not on file  Occupational History  . Not on file  Social Needs  . Financial resource strain: Not on file  . Food insecurity    Worry: Not on file    Inability: Not on file  . Transportation needs    Medical: Not on file    Non-medical: Not on file  Tobacco Use  . Smoking status: Current Some Day Smoker    Packs/day: 0.10    Years: 20.00    Pack  years: 2.00    Types: Cigarettes  . Smokeless tobacco: Never Used  Substance and Sexual Activity  . Alcohol use: No  . Drug use: No  . Sexual activity: Not Currently    Birth control/protection: Post-menopausal  Lifestyle  . Physical activity    Days per week: Not on file    Minutes per session: Not on file  . Stress: Not on file  Relationships  . Social Herbalist on phone: Not on file    Gets together: Not on file    Attends religious service: Not on file    Active member of club or organization: Not on file    Attends meetings of clubs or organizations: Not on file    Relationship status: Not on file  . Intimate partner violence    Fear of current  or ex partner: Not on file    Emotionally abused: Not on file    Physically abused: Not on file    Forced sexual activity: Not on file  Other Topics Concern  . Not on file  Social History Narrative  . Not on file    Family History  Problem Relation Age of Onset  . Hypertension Other   . Cancer Other   . Renal Disease Other   . Ovarian cancer Mother   . Kidney disease Father   . Breast cancer Maternal Aunt   . Breast cancer Maternal Grandmother     Allergies  Allergen Reactions  . Morphine And Related Hives  . Levaquin [Levofloxacin] Itching    Severe itching; prickly sensation  . Latex Rash  . Tape Rash     Review of Systems   Review of Systems: Negative Unless Checked Constitutional: [] Weight loss  [] Fever  [] Chills Cardiac: [] Chest pain   []  Atrial Fibrillation  [] Palpitations   [] Shortness of breath when laying flat   [] Shortness of breath with exertion. [] Shortness of breath at rest Vascular:  [] Pain in legs with walking   [] Pain in legs with standing [] Pain in legs when laying flat   [] Claudication    [] Pain in feet when laying flat    [] History of DVT   [] Phlebitis   [x] Swelling in legs   [] Varicose veins   [] Non-healing ulcers Pulmonary:   [] Uses home oxygen   [] Productive cough   [] Hemoptysis   [] Wheeze  [] COPD   [] Asthma Neurologic:  [] Dizziness   [] Seizures  [] Blackouts [x] History of stroke   [] History of TIA  [] Aphasia   [] Temporary Blindness   [] Weakness or numbness in arm   [] Weakness or numbness in leg Musculoskeletal:   [] Joint swelling   [] Joint pain   [] Low back pain  []  History of Knee Replacement [] Arthritis [] back Surgeries  []  Spinal Stenosis    Hematologic:  [] Easy bruising  [] Easy bleeding   [] Hypercoagulable state   [x] Anemic Gastrointestinal:  [] Diarrhea   [] Vomiting  [] Gastroesophageal reflux/heartburn   [] Difficulty swallowing. [] Abdominal pain Genitourinary:  [x] Chronic kidney disease   [] Difficult urination  [] Anuric   [] Blood in urine [] Frequent  urination  [] Burning with urination   [] Hematuria Skin:  [] Rashes   [] Ulcers [] Wounds Psychological:  [] History of anxiety   []  History of major depression  []  Memory Difficulties      OBJECTIVE:   Physical Exam  BP 135/89 (BP Location: Right Arm)   Pulse 80   Resp 16   Wt 155 lb 9.6 oz (70.6 kg)   BMI 25.11 kg/m   Gen: WD/WN, NAD Head: /AT, No temporalis  wasting.  Ear/Nose/Throat: Hearing grossly intact, nares w/o erythema or drainage Eyes: PER, EOMI, sclera nonicteric.  Neck: Supple, no masses.  No JVD.  Pulmonary:  Good air movement, no use of accessory muscles.  Cardiac: RRR Vascular:  Good thrill and bruit, more pulsatile towards the proximal end.  Palpable knot near medial portion Vessel Right Left  Radial Palpable Palpable   Gastrointestinal: soft, non-distended. No guarding/no peritoneal signs.  Musculoskeletal: M/S 5/5 throughout.  No deformity or atrophy.  Neurologic: Pain and light touch intact in extremities.  Symmetrical.  Speech is fluent. Motor exam as listed above. Psychiatric: Judgment intact, Mood & affect appropriate for pt's clinical situation. Dermatologic: No Venous rashes. No Ulcers Noted.  No changes consistent with cellulitis. Lymph : No Cervical lymphadenopathy, no lichenification or skin changes of chronic lymphedema.       ASSESSMENT AND PLAN:  1. ESRD (end stage renal disease) ([place]) Recommend:  The patient is experiencing increasing problems with their dialysis access.  Patient should have a fistulagram with the intention for intervention.  The intention for intervention is to restore appropriate flow and prevent thrombosis and possible loss of the access.  As well as improve the quality of dialysis therapy.  The risks, benefits and alternative therapies were reviewed in detail with the patient.  All questions were answered.  The patient agrees to proceed with angio/intervention.      2. Essential hypertension Continue antihypertensive  medications as already ordered, these medications have been reviewed and there are no changes at this time.   3. CAD, multiple vessel Continue cardiac and antihypertensive medications as already ordered and reviewed, no changes at this time.  Continue statin as ordered and reviewed, no changes at this time  Nitrates PRN for chest pain    Current Outpatient Medications on File Prior to Visit  Medication Sig Dispense Refill  . amiodarone (PACERONE) 200 MG tablet Take 200 mg by mouth 2 (two) times daily.    Marland Kitchen aspirin 81 MG chewable tablet Chew 81 mg by mouth daily.    . carvedilol (COREG) 12.5 MG tablet Take 12.5 mg by mouth 2 (two) times daily with a meal.    . cinacalcet (SENSIPAR) 60 MG tablet Take 60 mg by mouth every evening.     . diclofenac sodium (VOLTAREN) 1 % GEL Apply 4 g topically 4 (four) times daily. 100 g 3  . ELIQUIS 5 MG TABS tablet Take 1 tablet (5 mg total) by mouth 2 (two) times daily. 60 tablet 0  . hydrOXYzine (ATARAX/VISTARIL) 50 MG tablet Take 50 mg by mouth 2 (two) times daily as needed for itching.     . isosorbide mononitrate (IMDUR) 60 MG 24 hr tablet Take 60 mg by mouth daily.     Marland Kitchen levothyroxine (SYNTHROID) 75 MCG tablet Take 75 mcg by mouth daily before breakfast.     . midodrine (PROAMATINE) 10 MG tablet Take 10-20 mg by mouth daily. (for low BP associated with dialysis)    . oxyCODONE-acetaminophen (PERCOCET/ROXICET) 5-325 MG tablet Take 1 tablet by mouth 2 (two) times daily as needed for moderate pain.     . pantoprazole (PROTONIX) 40 MG tablet Take 40 mg by mouth daily.    . sacubitril-valsartan (ENTRESTO) 49-51 MG Take 1 tablet by mouth 2 (two) times daily.     . sevelamer carbonate (RENVELA) 800 MG tablet Take 2,400 mg by mouth 3 (three) times daily with meals. And 1 tablet with snacks    . sucralfate (CARAFATE) 1 g  tablet Take 1 g by mouth 2 (two) times daily.     Current Facility-Administered Medications on File Prior to Visit  Medication Dose Route  Frequency Provider Last Rate Last Dose  . sodium chloride flush (NS) 0.9 % injection 3 mL  3 mL Intravenous Q12H Dionisio David, MD        There are no Patient Instructions on file for this visit. No follow-ups on file.   Kris Hartmann, NP  This note was completed with Sales executive.  Any errors are purely unintentional.

## 2019-03-29 ENCOUNTER — Other Ambulatory Visit (INDEPENDENT_AMBULATORY_CARE_PROVIDER_SITE_OTHER): Payer: Self-pay | Admitting: Nurse Practitioner

## 2019-03-30 ENCOUNTER — Ambulatory Visit
Admission: RE | Admit: 2019-03-30 | Discharge: 2019-03-30 | Disposition: A | Discharge status: Home / Self Care | Payer: Medicare Other | Attending: Vascular Surgery | Admitting: Vascular Surgery

## 2019-03-30 ENCOUNTER — Other Ambulatory Visit: Payer: Self-pay

## 2019-03-30 ENCOUNTER — Ambulatory Visit: Payer: Medicare Other | Admitting: Anesthesiology

## 2019-03-30 ENCOUNTER — Encounter: Payer: Self-pay | Admitting: Anesthesiology

## 2019-03-30 ENCOUNTER — Encounter
Admission: RE | Disposition: A | Discharge status: Home / Self Care | Payer: Self-pay | Source: Home / Self Care | Attending: Vascular Surgery

## 2019-03-30 DIAGNOSIS — Z951 Presence of aortocoronary bypass graft: Secondary | ICD-10-CM | POA: Insufficient documentation

## 2019-03-30 DIAGNOSIS — Z8249 Family history of ischemic heart disease and other diseases of the circulatory system: Secondary | ICD-10-CM | POA: Insufficient documentation

## 2019-03-30 DIAGNOSIS — I252 Old myocardial infarction: Secondary | ICD-10-CM | POA: Insufficient documentation

## 2019-03-30 DIAGNOSIS — Z881 Allergy status to other antibiotic agents status: Secondary | ICD-10-CM | POA: Insufficient documentation

## 2019-03-30 DIAGNOSIS — N186 End stage renal disease: Secondary | ICD-10-CM

## 2019-03-30 DIAGNOSIS — Z79899 Other long term (current) drug therapy: Secondary | ICD-10-CM | POA: Diagnosis not present

## 2019-03-30 DIAGNOSIS — E785 Hyperlipidemia, unspecified: Secondary | ICD-10-CM | POA: Diagnosis not present

## 2019-03-30 DIAGNOSIS — Z7901 Long term (current) use of anticoagulants: Secondary | ICD-10-CM | POA: Insufficient documentation

## 2019-03-30 DIAGNOSIS — Z955 Presence of coronary angioplasty implant and graft: Secondary | ICD-10-CM | POA: Diagnosis not present

## 2019-03-30 DIAGNOSIS — I5020 Unspecified systolic (congestive) heart failure: Secondary | ICD-10-CM | POA: Insufficient documentation

## 2019-03-30 DIAGNOSIS — Y832 Surgical operation with anastomosis, bypass or graft as the cause of abnormal reaction of the patient, or of later complication, without mention of misadventure at the time of the procedure: Secondary | ICD-10-CM | POA: Insufficient documentation

## 2019-03-30 DIAGNOSIS — Z8673 Personal history of transient ischemic attack (TIA), and cerebral infarction without residual deficits: Secondary | ICD-10-CM | POA: Insufficient documentation

## 2019-03-30 DIAGNOSIS — I132 Hypertensive heart and chronic kidney disease with heart failure and with stage 5 chronic kidney disease, or end stage renal disease: Secondary | ICD-10-CM | POA: Insufficient documentation

## 2019-03-30 DIAGNOSIS — N179 Acute kidney failure, unspecified: Secondary | ICD-10-CM | POA: Diagnosis not present

## 2019-03-30 DIAGNOSIS — Z79890 Hormone replacement therapy (postmenopausal): Secondary | ICD-10-CM | POA: Insufficient documentation

## 2019-03-30 DIAGNOSIS — T82898A Other specified complication of vascular prosthetic devices, implants and grafts, initial encounter: Secondary | ICD-10-CM

## 2019-03-30 DIAGNOSIS — Z841 Family history of disorders of kidney and ureter: Secondary | ICD-10-CM | POA: Diagnosis not present

## 2019-03-30 DIAGNOSIS — I2510 Atherosclerotic heart disease of native coronary artery without angina pectoris: Secondary | ICD-10-CM | POA: Insufficient documentation

## 2019-03-30 DIAGNOSIS — F17210 Nicotine dependence, cigarettes, uncomplicated: Secondary | ICD-10-CM | POA: Insufficient documentation

## 2019-03-30 DIAGNOSIS — Z7982 Long term (current) use of aspirin: Secondary | ICD-10-CM | POA: Diagnosis not present

## 2019-03-30 DIAGNOSIS — Z992 Dependence on renal dialysis: Secondary | ICD-10-CM

## 2019-03-30 DIAGNOSIS — T82858A Stenosis of vascular prosthetic devices, implants and grafts, initial encounter: Secondary | ICD-10-CM | POA: Insufficient documentation

## 2019-03-30 DIAGNOSIS — Z885 Allergy status to narcotic agent status: Secondary | ICD-10-CM | POA: Diagnosis not present

## 2019-03-30 HISTORY — PX: A/V FISTULAGRAM: CATH118298

## 2019-03-30 LAB — POTASSIUM (ARMC VASCULAR LAB ONLY): Potassium (ARMC vascular lab): 4.1 (ref 3.5–5.1)

## 2019-03-30 SURGERY — A/V FISTULAGRAM
Anesthesia: General | Site: Arm Upper | Laterality: Left

## 2019-03-30 MED ORDER — MIDAZOLAM HCL 5 MG/5ML IJ SOLN
INTRAMUSCULAR | Status: DC | PRN
  Administered 2019-03-30: 2 mg via INTRAVENOUS

## 2019-03-30 MED ORDER — LIDOCAINE HCL (PF) 2 % IJ SOLN
INTRAMUSCULAR | Status: AC
  Filled 2019-03-30: qty 10

## 2019-03-30 MED ORDER — DIPHENHYDRAMINE HCL 50 MG/ML IJ SOLN: 50.0000 mg | Freq: Once | INTRAMUSCULAR | Status: DC | PRN

## 2019-03-30 MED ORDER — GLYCOPYRROLATE 0.2 MG/ML IJ SOLN
INTRAMUSCULAR | Status: AC
  Filled 2019-03-30: qty 1

## 2019-03-30 MED ORDER — MIDAZOLAM HCL 2 MG/ML PO SYRP: 8.0000 mg | ORAL_SOLUTION | Freq: Once | ORAL | Status: DC | PRN

## 2019-03-30 MED ORDER — FENTANYL CITRATE (PF) 100 MCG/2ML IJ SOLN: 25.0000 ug | INTRAMUSCULAR | Status: DC | PRN

## 2019-03-30 MED ORDER — FAMOTIDINE 20 MG PO TABS: 40.0000 mg | ORAL_TABLET | Freq: Once | ORAL | Status: DC | PRN

## 2019-03-30 MED ORDER — PROPOFOL 10 MG/ML IV BOLUS
INTRAVENOUS | Status: AC
  Filled 2019-03-30: qty 20

## 2019-03-30 MED ORDER — IODIXANOL 320 MG/ML IV SOLN
INTRAVENOUS | Status: DC | PRN
  Administered 2019-03-30: 09:00:00 30 mL via INTRAVENOUS

## 2019-03-30 MED ORDER — HEPARIN SODIUM (PORCINE) 1000 UNIT/ML IJ SOLN
INTRAMUSCULAR | Status: DC | PRN
  Administered 2019-03-30: 3000 [IU] via INTRAVENOUS

## 2019-03-30 MED ORDER — OXYCODONE HCL 5 MG/5ML PO SOLN
5.0000 mg | Freq: Once | ORAL | Status: DC | PRN
  Filled 2019-03-30: qty 5

## 2019-03-30 MED ORDER — FENTANYL CITRATE (PF) 100 MCG/2ML IJ SOLN: 12.5000 ug | Freq: Once | INTRAMUSCULAR | Status: DC | PRN

## 2019-03-30 MED ORDER — KETAMINE HCL 50 MG/ML IJ SOLN
INTRAMUSCULAR | Status: AC
  Filled 2019-03-30: qty 10

## 2019-03-30 MED ORDER — ONDANSETRON HCL 4 MG/2ML IJ SOLN: 4.0000 mg | Freq: Once | INTRAMUSCULAR | Status: DC | PRN

## 2019-03-30 MED ORDER — METHYLPREDNISOLONE SODIUM SUCC 125 MG IJ SOLR: 125.0000 mg | Freq: Once | INTRAMUSCULAR | Status: DC | PRN

## 2019-03-30 MED ORDER — CEFAZOLIN SODIUM-DEXTROSE 1-4 GM/50ML-% IV SOLN
1.0000 g | Freq: Once | INTRAVENOUS | Status: AC
  Administered 2019-03-30: 1 g via INTRAVENOUS

## 2019-03-30 MED ORDER — ONDANSETRON HCL 4 MG/2ML IJ SOLN: 4.0000 mg | Freq: Four times a day (QID) | INTRAMUSCULAR | Status: DC | PRN

## 2019-03-30 MED ORDER — PROPOFOL 500 MG/50ML IV EMUL
INTRAVENOUS | Status: DC | PRN
  Administered 2019-03-30: 25 ug/kg/min via INTRAVENOUS

## 2019-03-30 MED ORDER — GLYCOPYRROLATE 0.2 MG/ML IJ SOLN
INTRAMUSCULAR | Status: DC | PRN
  Administered 2019-03-30: 0.2 mg via INTRAVENOUS

## 2019-03-30 MED ORDER — KETAMINE HCL 50 MG/ML IJ SOLN
INTRAMUSCULAR | Status: DC | PRN
  Administered 2019-03-30: 25 mg via INTRAVENOUS

## 2019-03-30 MED ORDER — SODIUM CHLORIDE 0.9 % IV SOLN
INTRAVENOUS | Status: DC
  Administered 2019-03-30: 08:00:00 via INTRAVENOUS

## 2019-03-30 MED ORDER — LIDOCAINE 2% (20 MG/ML) 5 ML SYRINGE
INTRAMUSCULAR | Status: DC | PRN
  Administered 2019-03-30: 50 mg via INTRAVENOUS

## 2019-03-30 MED ORDER — OXYCODONE HCL 5 MG PO TABS: 5.0000 mg | ORAL_TABLET | Freq: Once | ORAL | Status: DC | PRN

## 2019-03-30 MED ORDER — HEPARIN SODIUM (PORCINE) 1000 UNIT/ML IJ SOLN
INTRAMUSCULAR | Status: AC
  Filled 2019-03-30: qty 1

## 2019-03-30 MED ORDER — MIDAZOLAM HCL 2 MG/2ML IJ SOLN
INTRAMUSCULAR | Status: AC
  Filled 2019-03-30: qty 2

## 2019-03-30 SURGICAL SUPPLY — 12 items
BALLN LUTONIX AV 8X60X75 (BALLOONS) ×2
BALLOON LUTONIX AV 8X60X75 (BALLOONS) ×1 IMPLANT
CANNULA 5F STIFF (CANNULA) ×2 IMPLANT
DEVICE PRESTO INFLATION (MISCELLANEOUS) ×2 IMPLANT
DRAPE BRACHIAL (DRAPES) ×2 IMPLANT
NEEDLE ENTRY 21GA 7CM ECHOTIP (NEEDLE) ×2 IMPLANT
PACK ANGIOGRAPHY (CUSTOM PROCEDURE TRAY) ×2 IMPLANT
SET INTRO CAPELLA COAXIAL (SET/KITS/TRAYS/PACK) ×2 IMPLANT
SHEATH BRITE TIP 6FRX5.5 (SHEATH) ×2 IMPLANT
SUT MNCRL AB 4-0 PS2 18 (SUTURE) ×2 IMPLANT
TOWEL OR 17X26 4PK STRL BLUE (TOWEL DISPOSABLE) ×2 IMPLANT
WIRE MAGIC TOR.035 180C (WIRE) ×2 IMPLANT

## 2019-03-30 NOTE — H&P (Signed)
[facility] History & Physical Update  The patient was interviewed and re-examined.  The patient's previous History and Physical has been reviewed and is unchanged.  There is no change in the plan of care. We plan to proceed with the scheduled procedure.  Leotis Pain, MD  03/30/2019, 8:22 AM

## 2019-03-30 NOTE — Discharge Instructions (Signed)
General Anesthesia, Adult, Care After °This sheet gives you information about how to care for yourself after your procedure. Your health care provider may also give you more specific instructions. If you have problems or questions, contact your health care provider. °What can I expect after the procedure? °After the procedure, the following side effects are common: °· Pain or discomfort at the IV site. °· Nausea. °· Vomiting. °· Sore throat. °· Trouble concentrating. °· Feeling cold or chills. °· Weak or tired. °· Sleepiness and fatigue. °· Soreness and body aches. These side effects can affect parts of the body that were not involved in surgery. °Follow these instructions at home: ° °For at least 24 hours after the procedure: °· Have a responsible adult stay with you. It is important to have someone help care for you until you are awake and alert. °· Rest as needed. °· Do not: °? Participate in activities in which you could fall or become injured. °? Drive. °? Use heavy machinery. °? Drink alcohol. °? Take sleeping pills or medicines that cause drowsiness. °? Make important decisions or sign legal documents. °? Take care of children on your own. °Eating and drinking °· Follow any instructions from your health care provider about eating or drinking restrictions. °· When you feel hungry, start by eating small amounts of foods that are soft and easy to digest (bland), such as toast. Gradually return to your regular diet. °· Drink enough fluid to keep your urine pale yellow. °· If you vomit, rehydrate by drinking water, juice, or clear broth. °General instructions °· If you have sleep apnea, surgery and certain medicines can increase your risk for breathing problems. Follow instructions from your health care provider about wearing your sleep device: °? Anytime you are sleeping, including during daytime naps. °? While taking prescription pain medicines, sleeping medicines, or medicines that make you drowsy. °· Return to  your normal activities as told by your health care provider. Ask your health care provider what activities are safe for you. °· Take over-the-counter and prescription medicines only as told by your health care provider. °· If you smoke, do not smoke without supervision. °· Keep all follow-up visits as told by your health care provider. This is important. °Contact a health care provider if: °· You have nausea or vomiting that does not get better with medicine. °· You cannot eat or drink without vomiting. °· You have pain that does not get better with medicine. °· You are unable to pass urine. °· You develop a skin rash. °· You have a fever. °· You have redness around your IV site that gets worse. °Get help right away if: °· You have difficulty breathing. °· You have chest pain. °· You have blood in your urine or stool, or you vomit blood. °Summary °· After the procedure, it is common to have a sore throat or nausea. It is also common to feel tired. °· Have a responsible adult stay with you for the first 24 hours after general anesthesia. It is important to have someone help care for you until you are awake and alert. °· When you feel hungry, start by eating small amounts of foods that are soft and easy to digest (bland), such as toast. Gradually return to your regular diet. °· Drink enough fluid to keep your urine pale yellow. °· Return to your normal activities as told by your health care provider. Ask your health care provider what activities are safe for you. °This information is not   intended to replace advice given to you by your health care provider. Make sure you discuss any questions you have with your health care provider. Document Released: 09/07/2000 Document Revised: 06/04/2017 Document Reviewed: 01/15/2017 Elsevier Patient Education  2020 [place]. Dialysis Fistulogram, Care After This sheet gives you information about how to care for yourself after your procedure. Your health care provider may  also give you more specific instructions. If you have problems or questions, contact your health care provider. What can I expect after the procedure? After the procedure, it is common to have:  A small amount of discomfort in the area where the small, thin tube (catheter) was placed for the procedure.  A small amount of bruising around the fistula.  Sleepiness and tiredness (fatigue). Follow these instructions at home: Activity   Rest at home and do not lift anything that is heavier than 5 lb (2.3 kg) on the day after your procedure.  Return to your normal activities as told by your health care provider. Ask your health care provider what activities are safe for you.  Do not drive or use heavy machinery while taking prescription pain medicine.  Do not drive for 24 hours if you were given a medicine to help you relax (sedative) during your procedure. Medicines   Take over-the-counter and prescription medicines only as told by your health care provider. Puncture site care  Follow instructions from your health care provider about how to take care of the site where catheters were inserted. Make sure you: ? Wash your hands with soap and water before you change your bandage (dressing). If soap and water are not available, use hand sanitizer. ? Change your dressing as told by your health care provider. ? Leave stitches (sutures), skin glue, or adhesive strips in place. These skin closures may need to stay in place for 2 weeks or longer. If adhesive strip edges start to loosen and curl up, you may trim the loose edges. Do not remove adhesive strips completely unless your health care provider tells you to do that.  Check your puncture area every day for signs of infection. Check for: ? Redness, swelling, or pain. ? Fluid or blood. ? Warmth. ? Pus or a bad smell. General instructions  Do not take baths, swim, or use a hot tub until your health care provider approves. Ask your health care  provider if you may take showers. You may only be allowed to take sponge baths.  Monitor your dialysis fistula closely. Check to make sure that you can feel a vibration or buzz (a thrill) when you put your fingers over the fistula.  Prevent damage to your graft or fistula: ? Do not wear tight-fitting clothing or jewelry on the arm or leg that has your graft or fistula. ? Tell all your health care providers that you have a dialysis fistula or graft. ? Do not allow blood draws, IVs, or blood pressure readings to be done in the arm that has your fistula or graft. ? Do not allow flu shots or vaccinations in the arm with your fistula or graft.  Keep all follow-up visits as told by your health care provider. This is important. Contact a health care provider if:  You have redness, swelling, or pain at the site where the catheter was put in.  You have fluid or blood coming from the catheter site.  The catheter site feels warm to the touch.  You have pus or a bad smell coming from the catheter  site.  You have a fever or chills. Get help right away if:  You feel weak.  You have trouble balancing.  You have trouble moving your arms or legs.  You have problems with your speech or vision.  You can no longer feel a vibration or buzz when you put your fingers over your dialysis fistula.  The limb that was used for the procedure: ? Swells. ? Is painful. ? Is cold. ? Is discolored, such as blue or pale white.  You have chest pain or shortness of breath. Summary  After a dialysis fistulogram, it is common to have a small amount of discomfort or bruising in the area where the small, thin tube (catheter) was placed.  Rest at home on the day after your procedure. Return to your normal activities as told by your health care provider.  Take over-the-counter and prescription medicines only as told by your health care provider.  Follow instructions from your health care provider about how to  take care of the site where the catheter was inserted.  Keep all follow-up visits as told by your health care provider. This information is not intended to replace advice given to you by your health care provider. Make sure you discuss any questions you have with your health care provider. Document Released: 10/16/2013 Document Revised: 07/02/2017 Document Reviewed: 07/02/2017 Elsevier Patient Education  2020 [employer].

## 2019-03-30 NOTE — Anesthesia Preprocedure Evaluation (Addendum)
Anesthesia Evaluation  Patient identified by MRN, date of birth, ID band Patient awake    Reviewed: Allergy & Precautions, H&P , NPO status , Patient's Chart, lab work & pertinent test results  History of Anesthesia Complications Negative for: history of anesthetic complications  Airway Mallampati: III  TM Distance: >3 FB Neck ROM: full    Dental  (+) Chipped, Poor Dentition   Pulmonary neg shortness of breath, pneumonia, COPD, Current Smoker and Patient abstained from smoking.,           Cardiovascular Exercise Tolerance: Good hypertension, (-) angina+ CAD, + Past MI, + Cardiac Stents and +CHF       Neuro/Psych PSYCHIATRIC DISORDERS CVA    GI/Hepatic negative GI ROS, neg GERD  ,(+) Hepatitis -  Endo/Other  negative endocrine ROS  Renal/GU DialysisRenal disease     Musculoskeletal   Abdominal   Peds  Hematology negative hematology ROS (+)   Anesthesia Other Findings Past Medical History: No date: Anemia     Comment:  chronic disease No date: CHF (congestive heart failure) (HCC) No date: Coronary artery disease No date: Heart failure (HCC) No date: Hepatitis     Comment:  history of hep c No date: Hyperlipidemia No date: Hypertension No date: Myocardial infarction ([place]) No date: Paroxysmal atrial fibrillation ([place]) No date: Renal failure No date: Renal insufficiency 2011: Stroke [facility])  Past Surgical History: 2013: CORONARY ANGIOPLASTY WITH STENT PLACEMENT No date: CORONARY ARTERY BYPASS GRAFT No date: DIALYSIS FISTULA CREATION 02/23/2019: FLEXIBLE SIGMOIDOSCOPY; N/A     Comment:  Procedure: FLEXIBLE SIGMOIDOSCOPY;  Surgeon: Lollie Sails, MD;  Location: [facility] ENDOSCOPY;  Service:               Endoscopy;  Laterality: N/A; 08/08/2018: LEFT HEART CATH AND CORS/GRAFTS ANGIOGRAPHY; N/A     Comment:  Procedure: LEFT HEART CATH AND CORS/GRAFTS ANGIOGRAPHY;               Surgeon: Dionisio David, MD;  Location: [place] CV              LAB;  Service: Cardiovascular;  Laterality: N/A; 01/30/2019: LEFT HEART CATH AND CORS/GRAFTS ANGIOGRAPHY; N/A     Comment:  Procedure: LEFT HEART CATH AND CORS/GRAFTS ANGIOGRAPHY;               Surgeon: Dionisio David, MD;  Location: [place] CV              LAB;  Service: Cardiovascular;  Laterality: N/A;     Reproductive/Obstetrics negative OB ROS                             Anesthesia Physical Anesthesia Plan  ASA: IV  Anesthesia Plan: General LMA   Post-op Pain Management:    Induction: Intravenous  PONV Risk Score and Plan: Dexamethasone, Ondansetron, Treatment may vary due to age or medical condition, Propofol infusion and TIVA  Airway Management Planned: Nasal Cannula  Additional Equipment:   Intra-op Plan:   Post-operative Plan: Extubation in OR  Informed Consent: I have reviewed the patients History and Physical, chart, labs and discussed the procedure including the risks, benefits and alternatives for the proposed anesthesia with the patient or authorized representative who has indicated his/her understanding and acceptance.     Dental Advisory Given  Plan Discussed with: Anesthesiologist, CRNA and Surgeon  Anesthesia  Plan Comments: (Patient consented for risks of anesthesia including but not limited to:  - adverse reactions to medications - damage to teeth, lips or other oral mucosa - sore throat or hoarseness - Damage to heart, brain, lungs or loss of life  Patient voiced understanding.)       Anesthesia Quick Evaluation

## 2019-03-30 NOTE — Transfer of Care (Signed)
Immediate Anesthesia Transfer of Care Note  Patient: [name]  Procedure(s) Performed: A/V FISTULAGRAM (Left Arm Upper)  Patient Location: PACU  Anesthesia Type:General  Level of Consciousness: awake, alert  and oriented  Airway & Oxygen Therapy: Patient Spontanous Breathing and Patient connected to face mask oxygen  Post-op Assessment: Report given to RN and Post -op Vital signs reviewed and stable  Post vital signs: Reviewed  Last Vitals:  Vitals Value Taken Time  BP 118/69 03/30/19 0848  Temp    Pulse 61 03/30/19 0848  Resp 31 03/30/19 0848  SpO2 100 % 03/30/19 0848  Vitals shown include unvalidated device data.  Last Pain:  Vitals:   03/30/19 0730  TempSrc: Oral  PainSc:          Complications: No apparent anesthesia complications

## 2019-03-30 NOTE — Anesthesia Postprocedure Evaluation (Signed)
Anesthesia Post Note  Patient: [name]  Procedure(s) Performed: A/V FISTULAGRAM (Left Arm Upper)  Patient location during evaluation: PACU Anesthesia Type: General Level of consciousness: awake and alert Pain management: pain level controlled Vital Signs Assessment: post-procedure vital signs reviewed and stable Respiratory status: spontaneous breathing, nonlabored ventilation, respiratory function stable and patient connected to nasal cannula oxygen Cardiovascular status: blood pressure returned to baseline and stable Postop Assessment: no apparent nausea or vomiting Anesthetic complications: no     Last Vitals:  Vitals:   03/30/19 0919 03/30/19 0930  BP: 107/81 115/73  Pulse:  (!) 51  Resp: 16 16  Temp:    SpO2:  98%    Last Pain:  Vitals:   03/30/19 0930  TempSrc:   PainSc: 0-No pain                 Precious Haws Piscitello

## 2019-03-30 NOTE — Anesthesia Post-op Follow-up Note (Signed)
Anesthesia QCDR form completed.        

## 2019-03-30 NOTE — Op Note (Signed)
[name]    OPERATIVE NOTE   PROCEDURE: 1.   Left brachiobasilic arteriovenous fistula/Artegraft jump graft cannulation under ultrasound guidance 2.   Left arm fistulagram including central venogram 3.   Percutaneous transluminal angioplasty of left axillary vein with 8 mm diameter by 6 cm length Lutonix drug-coated angioplasty balloon  PRE-OPERATIVE DIAGNOSIS: 1. ESRD 2. Poorly functional left brachiobasilic AVF with previous Artegraft jump graft  POST-OPERATIVE DIAGNOSIS: same as above   SURGEON: Leotis Pain, MD  ANESTHESIA: MAC  ESTIMATED BLOOD LOSS: 3 cc  FINDING(S): 1. There was about a 75% narrowing in the axillary vein just proximal to the previous jump graft anastomosis.  The remainder of the jump graft and the fistula were patent without significant stenosis.  The central venous circulation beyond this axillary stenosis was patent without significant stenosis.  SPECIMEN(S):  None  CONTRAST: 30 cc  FLUORO TIME: 1.5 minutes  INDICATIONS: [name] is a 58 y.o. female who presents with malfunctioning left brachiobasilic arteriovenous fistula that is had a previous Artegraft jump graft.  She has prolonged bleeding.  The patient is scheduled for left arm fistulagram.  The patient is aware the risks include but are not limited to: bleeding, infection, thrombosis of the cannulated access, and possible anaphylactic reaction to the contrast.  The patient is aware of the risks of the procedure and elects to proceed forward.  DESCRIPTION: After full informed written consent was obtained, the patient was brought back to the angiography suite and placed supine upon the angiography table.  The patient was connected to monitoring equipment.  Anesthesia provided deep sedation.  The left arm was prepped and draped in the standard fashion for a percutaneous access intervention.  Under ultrasound guidance, the left brachiobasilic arteriovenous fistula was  cannulated with a micropuncture needle under direct ultrasound guidance where it was patent and a permanent image was performed.  The microwire was advanced into the fistula and the needle was exchanged for the a microsheath.  I then upsized to a 6 Fr Sheath and imaging was performed.  Hand injections were completed to image the access including the central venous system. This demonstrated a 75% narrowing in the axillary vein just proximal to the previous jump graft anastomosis.  The remainder of the jump graft and the fistula were patent without significant stenosis.  The central venous circulation beyond this axillary stenosis was patent without significant stenosis.  Based on the images, this patient will need intervention to this axillary vein stenosis that should help her prolonged bleeding and improve fistula function. I then gave the patient 3000 units of intravenous heparin.  I then crossed the stenosis with a Magic Tourqe wire.  Based on the imaging, a 8 mm x 6 cm  Lutonix drug coated angioplasty balloon was selected.  The balloon was centered around the axillary stenosis and inflated to 12 ATM for 1 minute(s).  On completion imaging, a 10-15% residual stenosis was present.     Based on the completion imaging, no further intervention is necessary.  The wire and balloon were removed from the sheath.  A 4-0 Monocryl purse-string suture was sewn around the sheath.  The sheath was removed while tying down the suture.  A sterile bandage was applied to the puncture site.  COMPLICATIONS: None  CONDITION: Stable   Leotis Pain  03/30/2019 8:41 AM   This note was created with Dragon Medical transcription system. Any errors in dictation are purely unintentional.

## 2019-05-16 ENCOUNTER — Ambulatory Visit (INDEPENDENT_AMBULATORY_CARE_PROVIDER_SITE_OTHER): Payer: Medicare Other | Admitting: Nurse Practitioner

## 2019-05-23 ENCOUNTER — Ambulatory Visit: Payer: Medicare Other | Admitting: Obstetrics and Gynecology

## 2019-06-20 ENCOUNTER — Ambulatory Visit (INDEPENDENT_AMBULATORY_CARE_PROVIDER_SITE_OTHER): Payer: Medicare Other | Admitting: Nurse Practitioner

## 2019-06-27 ENCOUNTER — Ambulatory Visit (INDEPENDENT_AMBULATORY_CARE_PROVIDER_SITE_OTHER): Payer: Medicare Other | Admitting: Nurse Practitioner

## 2019-07-01 IMAGING — US US EXTREM LOW VENOUS BILAT
1 series · 13 of 24 positions shown · non-contrast
Comparison: None.

CLINICAL DATA: Possible distal left lower lobe pulmonary embolism
by CTA.



[Series 1: us extrem low venous bilat · 0.07mm/px · 13 of 58 slices shown]
[im 1/58]
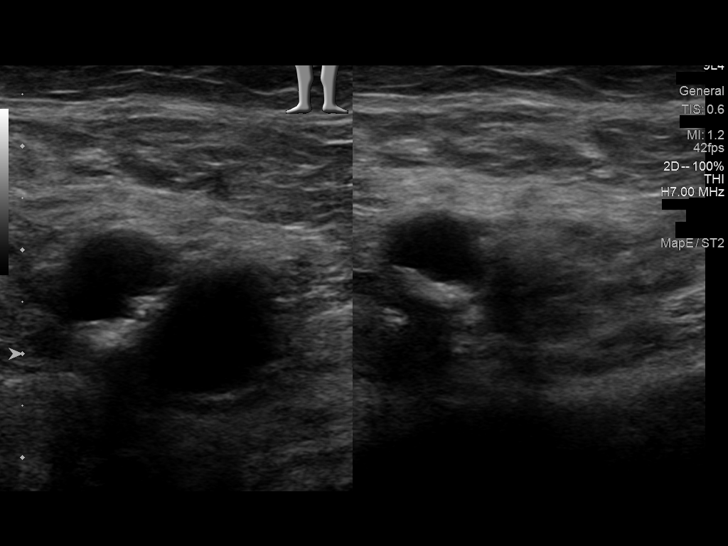
[im 5/58]
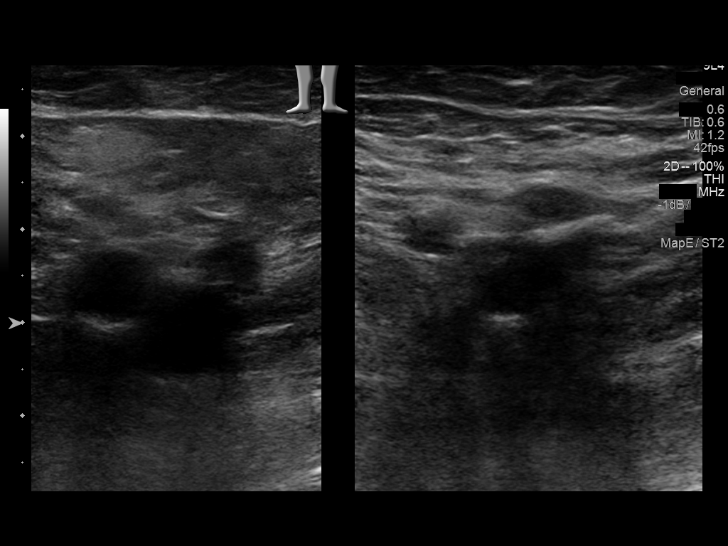
[im 10/58]
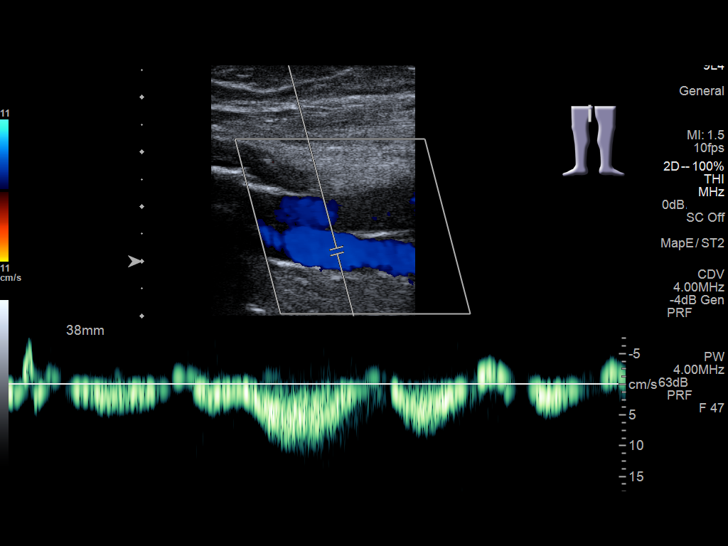
[im 15/58]
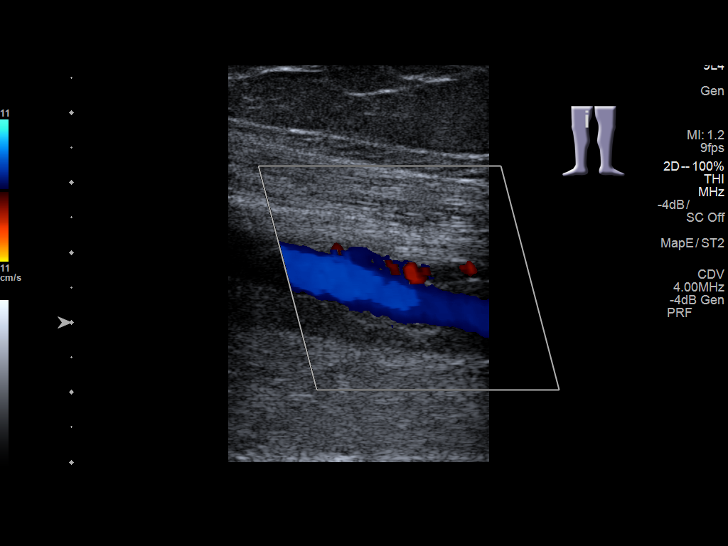
[im 20/58]
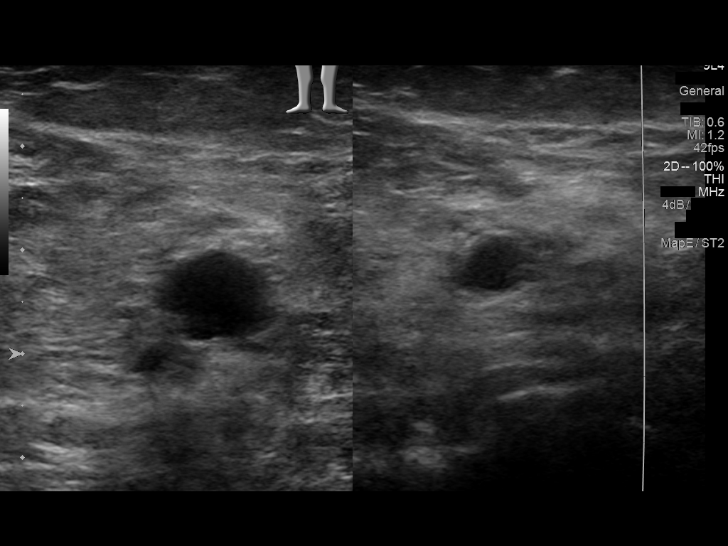
[im 25/58]
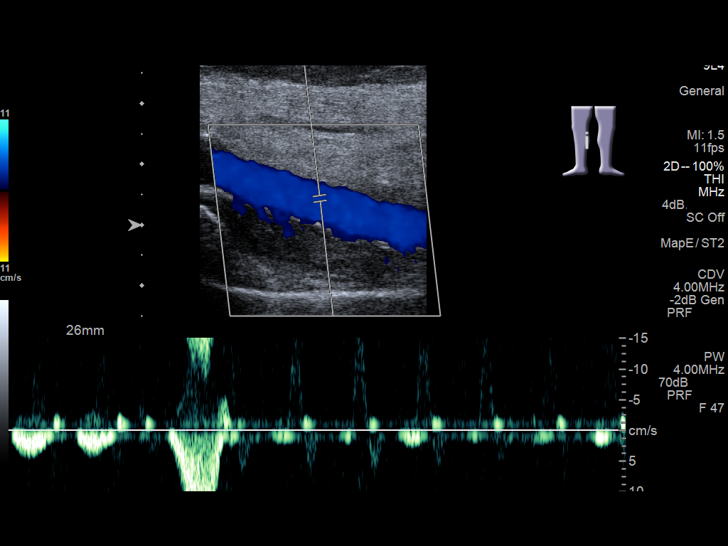
[im 30/58]
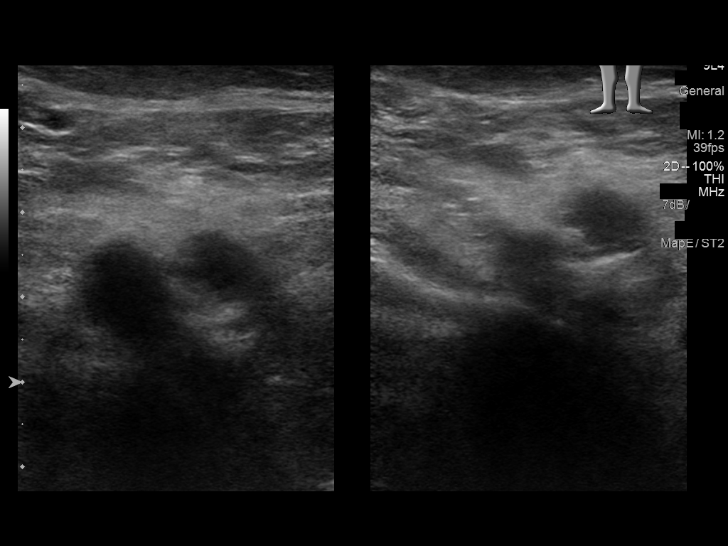
[im 33/58]
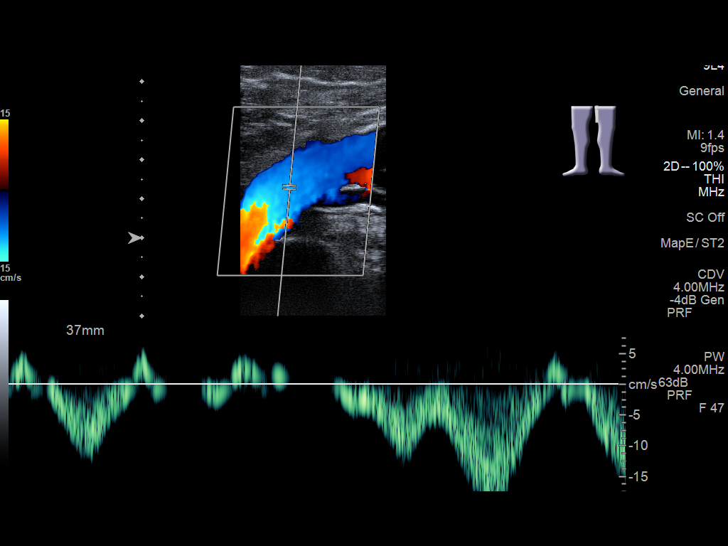
[im 38/58]
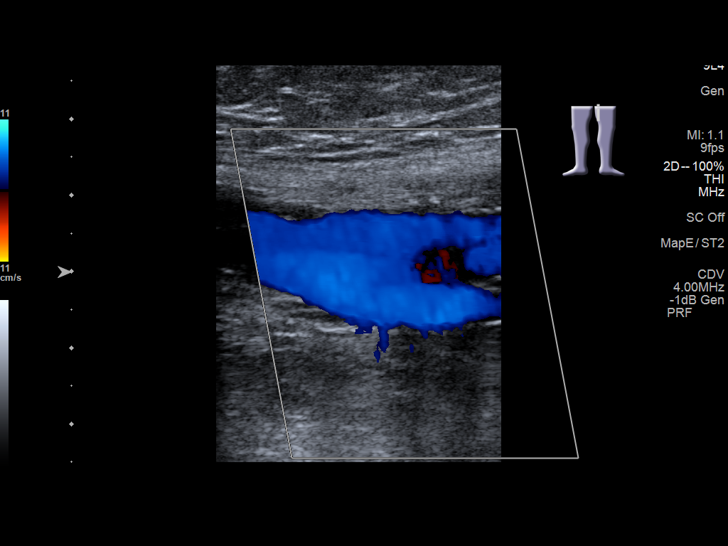
[im 43/58]
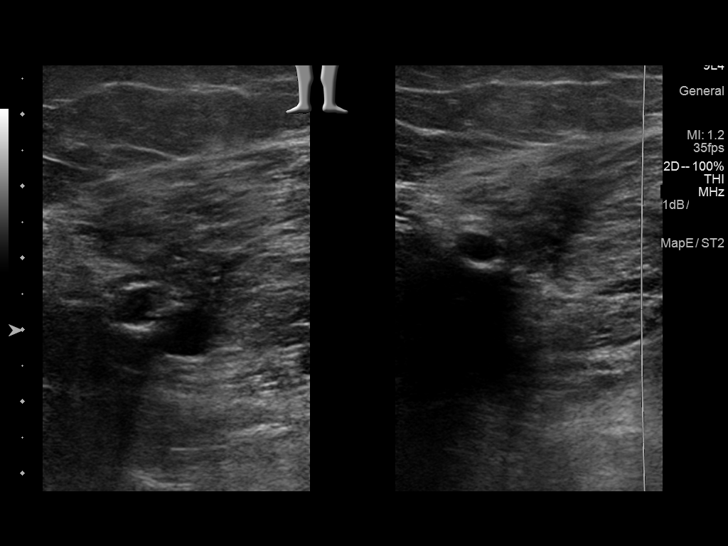
[im 48/58]
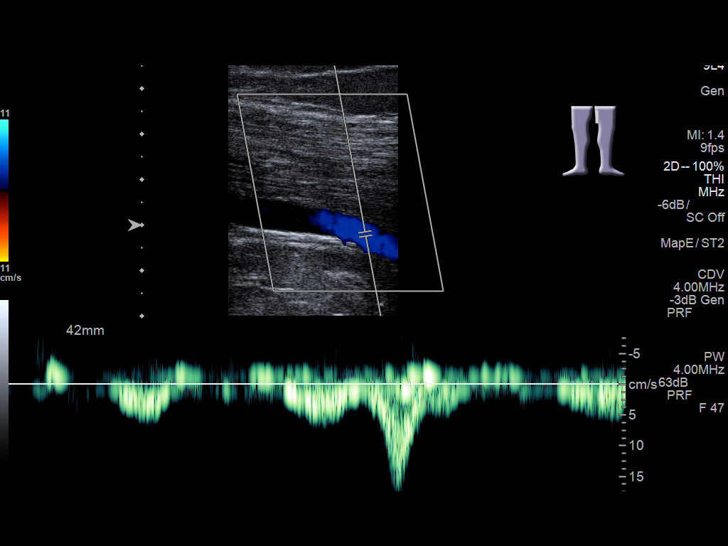
[im 53/58]
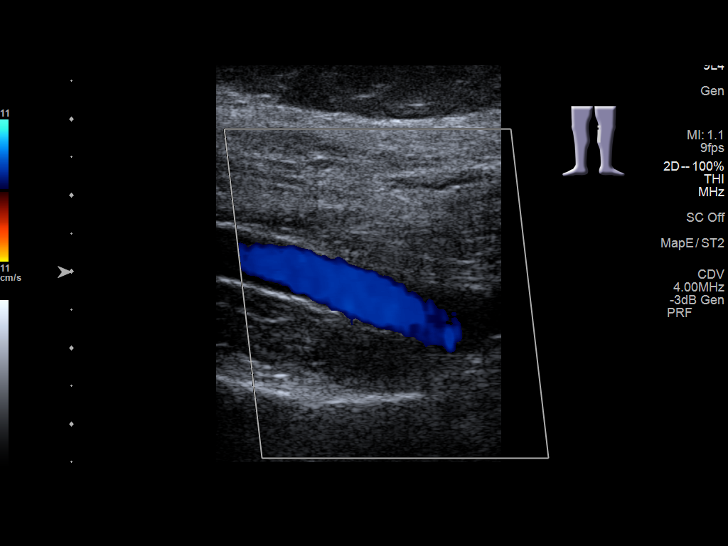
[im 58/58]
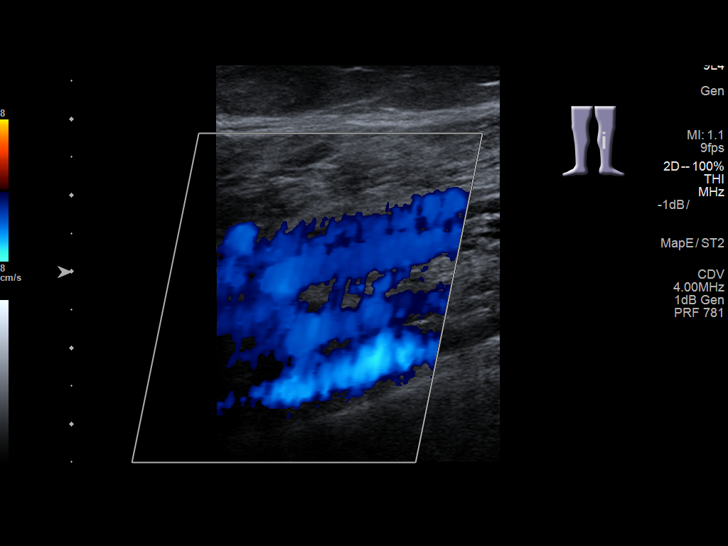

[13 of 24 positions shown; findings below may reference images not displayed]

FINDINGS: RIGHT LOWER EXTREMITY

Common Femoral Vein: No evidence of thrombus. Normal
compressibility, respiratory phasicity and response to augmentation.

Saphenofemoral Junction: No evidence of thrombus. Normal
compressibility and flow on color Doppler imaging.

Profunda Femoral Vein: No evidence of thrombus. Normal
compressibility and flow on color Doppler imaging.

Femoral Vein: No evidence of thrombus. Normal compressibility,
respiratory phasicity and response to augmentation.

Popliteal Vein: No evidence of thrombus. Normal compressibility,
respiratory phasicity and response to augmentation.

Calf Veins: No evidence of thrombus. Normal compressibility and flow
on color Doppler imaging.

Superficial Great Saphenous Vein: No evidence of thrombus. Normal
compressibility.

Venous Reflux:  None.

Other Findings: No evidence of superficial thrombophlebitis or
abnormal fluid collection.

LEFT LOWER EXTREMITY

Common Femoral Vein: No evidence of thrombus. Normal
compressibility, respiratory phasicity and response to augmentation.

Saphenofemoral Junction: No evidence of thrombus. Normal
compressibility and flow on color Doppler imaging.

Profunda Femoral Vein: No evidence of thrombus. Normal
compressibility and flow on color Doppler imaging.

Femoral Vein: No evidence of thrombus. Normal compressibility,
respiratory phasicity and response to augmentation.

Popliteal Vein: No evidence of thrombus. Normal compressibility,
respiratory phasicity and response to augmentation.

Calf Veins: No evidence of thrombus. Normal compressibility and flow
on color Doppler imaging.

Superficial Great Saphenous Vein: No evidence of thrombus. Normal
compressibility.

Venous Reflux:  None.

Other Findings: No evidence of superficial thrombophlebitis or
abnormal fluid collection.
IMPRESSION: No evidence of bilateral lower extremity deep venous thrombosis.

## 2019-08-22 ENCOUNTER — Ambulatory Visit (INDEPENDENT_AMBULATORY_CARE_PROVIDER_SITE_OTHER): Payer: Medicare Other | Admitting: Nurse Practitioner

## 2019-08-22 ENCOUNTER — Other Ambulatory Visit (INDEPENDENT_AMBULATORY_CARE_PROVIDER_SITE_OTHER): Payer: Self-pay | Admitting: Nurse Practitioner

## 2019-08-22 ENCOUNTER — Encounter (INDEPENDENT_AMBULATORY_CARE_PROVIDER_SITE_OTHER): Payer: Medicare Other

## 2019-08-22 DIAGNOSIS — Z9862 Peripheral vascular angioplasty status: Secondary | ICD-10-CM

## 2019-08-22 DIAGNOSIS — N186 End stage renal disease: Secondary | ICD-10-CM

## 2019-09-21 ENCOUNTER — Ambulatory Visit (INDEPENDENT_AMBULATORY_CARE_PROVIDER_SITE_OTHER): Payer: Medicare Other | Admitting: Nurse Practitioner

## 2019-09-21 ENCOUNTER — Ambulatory Visit (INDEPENDENT_AMBULATORY_CARE_PROVIDER_SITE_OTHER): Payer: Medicare Other

## 2019-09-21 ENCOUNTER — Other Ambulatory Visit: Payer: Self-pay

## 2019-09-21 ENCOUNTER — Encounter (INDEPENDENT_AMBULATORY_CARE_PROVIDER_SITE_OTHER): Payer: Self-pay | Admitting: Nurse Practitioner

## 2019-09-21 VITALS — BP 115/64 | HR 64 | Resp 16 | Wt 157.8 lb

## 2019-09-21 DIAGNOSIS — Z9862 Peripheral vascular angioplasty status: Secondary | ICD-10-CM | POA: Diagnosis not present

## 2019-09-21 DIAGNOSIS — I10 Essential (primary) hypertension: Secondary | ICD-10-CM

## 2019-09-21 DIAGNOSIS — N186 End stage renal disease: Secondary | ICD-10-CM | POA: Diagnosis not present

## 2019-09-21 DIAGNOSIS — E785 Hyperlipidemia, unspecified: Secondary | ICD-10-CM | POA: Diagnosis not present

## 2019-09-21 NOTE — Progress Notes (Signed)
Subjective:    Patient ID: [name], female    DOB: 08/14/1960, 59 y.o.   MRN: [id number] Chief Complaint  Patient presents with  . Follow-up    [facility] 6week post fistula    The patient returns to the office for followup status post intervention of the dialysis access left brachial basilic AV  jump graft. Following the intervention the excess function was improved however the patient continues to have bleeding after dialysis.  The patient states that she holds pressure for approximately 20 minutes following the end of her dialysis sessions however she continues to bleed for up to 24 hours after dialysis.  This bleeding is in small amounts it is a persistent trickle.  The bleeding has been consistent for the last few dialysis sessions and there is no difference in between technicians.  The patient denies an increase in arm swelling. At the present time the patient denies hand pain.  The patient denies amaurosis fugax or recent TIA symptoms. There are no recent neurological changes noted. The patient denies claudication symptoms or rest pain symptoms. The patient denies history of DVT, PE or superficial thrombophlebitis. The patient denies recent episodes of angina or shortness of breath.   Today the patient has noninvasive studies.  The patient has a flow volume of 30-81.  There is a left upper arm hematoma in the midportion with no flow approximately 3.74 cm x 1.47 cm x 1.73 cm.  This hematoma was visualized in the previous exam on 03/23/2019 and it appears to be slightly larger.  No evidence of still.  Overall graft is patent.   Review of Systems  Hematological: Bruises/bleeds easily.       Bleeding from AV fistula up to a day after dialysis  All other systems reviewed and are negative.      Objective:   Physical Exam Constitutional:      General: She is awake.     Appearance: Normal appearance.  Cardiovascular:     Rate and Rhythm: Normal rate and regular rhythm.     Pulses:           Radial pulses are 2+ on the left side.     Arteriovenous access: left arteriovenous access is present.    Comments: Good thrill and bruit.  Hematoma at medial portion Neurological:     Mental Status: She is alert.  Psychiatric:        Behavior: Behavior is cooperative.     BP 115/64 (BP Location: Right Arm)   Pulse 64   Resp 16   Wt 157 lb 12.8 oz (71.6 kg)   BMI 25.47 kg/m   Past Medical History:  Diagnosis Date  . Anemia    chronic disease  . CHF (congestive heart failure) ([place])   . Coronary artery disease   . Heart failure ([place])   . Hepatitis    history of hep c  . Hyperlipidemia   . Hypertension   . Myocardial infarction ([place])   . Paroxysmal atrial fibrillation ([facility])   . Renal failure   . Renal insufficiency   . Stroke [facility]) 2011    Social History   Socioeconomic History  . Marital status: Single    Spouse name: Not on file  . Number of children: Not on file  . Years of education: Not on file  . Highest education level: Not on file  Occupational History  . Not on file  Tobacco Use  . Smoking status: Current Some Day Smoker  Packs/day: 0.10    Years: 20.00    Pack years: 2.00    Types: Cigarettes  . Smokeless tobacco: Never Used  . Tobacco comment: smokes one a day  Substance and Sexual Activity  . Alcohol use: No  . Drug use: No  . Sexual activity: Not Currently    Birth control/protection: Post-menopausal  Other Topics Concern  . Not on file  Social History Narrative  . Not on file   Social Determinants of Health   Financial Resource Strain:   . Difficulty of Paying Living Expenses:   Food Insecurity:   . Worried About Charity fundraiser in the Last Year:   . Arboriculturist in the Last Year:   Transportation Needs:   . Film/video editor (Medical):   Marland Kitchen Lack of Transportation (Non-Medical):   Physical Activity:   . Days of Exercise per Week:   . Minutes of Exercise per Session:   Stress:   . Feeling of Stress :     Social Connections:   . Frequency of Communication with Friends and Family:   . Frequency of Social Gatherings with Friends and Family:   . Attends Religious Services:   . Active Member of Clubs or Organizations:   . Attends Archivist Meetings:   Marland Kitchen Marital Status:   Intimate Partner Violence:   . Fear of Current or Ex-Partner:   . Emotionally Abused:   Marland Kitchen Physically Abused:   . Sexually Abused:     Past Surgical History:  Procedure Laterality Date  . A/V FISTULAGRAM Left 03/30/2019   Procedure: A/V FISTULAGRAM;  Surgeon: Algernon Huxley, MD;  Location: [place] [facility];  Service: Cardiovascular;  Laterality: Left;  . CORONARY ANGIOPLASTY WITH STENT PLACEMENT  2013  . CORONARY ARTERY BYPASS GRAFT    . DIALYSIS FISTULA CREATION    . FLEXIBLE SIGMOIDOSCOPY N/A 02/23/2019   Procedure: FLEXIBLE SIGMOIDOSCOPY;  Surgeon: Lollie Sails, MD;  Location: [facility] ENDOSCOPY;  Service: Endoscopy;  Laterality: N/A;  . LEFT HEART CATH AND CORS/GRAFTS ANGIOGRAPHY N/A 08/08/2018   Procedure: LEFT HEART CATH AND CORS/GRAFTS ANGIOGRAPHY;  Surgeon: Dionisio David, MD;  Location: [place] [facility];  Service: Cardiovascular;  Laterality: N/A;  . LEFT HEART CATH AND CORS/GRAFTS ANGIOGRAPHY N/A 01/30/2019   Procedure: LEFT HEART CATH AND CORS/GRAFTS ANGIOGRAPHY;  Surgeon: Dionisio David, MD;  Location: [place] [facility];  Service: Cardiovascular;  Laterality: N/A;    Family History  Problem Relation Age of Onset  . Hypertension Other   . Cancer Other   . Renal Disease Other   . Ovarian cancer Mother   . Kidney disease Father   . Breast cancer Maternal Aunt   . Breast cancer Maternal Grandmother     Allergies  Allergen Reactions  . Morphine And Related Hives  . Levaquin [Levofloxacin] Itching    Severe itching; prickly sensation  . Latex Rash  . Tape Rash       Assessment & Plan:   1. ESRD (end stage renal disease) ([place]) Recommend:  The patient is experiencing  increasing problems with their dialysis access.  Patient should have a fistulagram with the intention for intervention.  The intention for intervention is to restore appropriate flow and prevent thrombosis and possible loss of the access.  As well as improve the quality of dialysis therapy.  The risks, benefits and alternative therapies were reviewed in detail with the patient.  All questions were answered.  The patient agrees  to proceed with angio/intervention.      2. Essential hypertension Continue antihypertensive medications as already ordered, these medications have been reviewed and there are no changes at this time.   3. Hyperlipidemia, unspecified hyperlipidemia type Continue statin as ordered and reviewed, no changes at this time    Current Outpatient Medications on File Prior to Visit  Medication Sig Dispense Refill  . amiodarone (PACERONE) 200 MG tablet Take 200 mg by mouth 2 (two) times daily.    Marland Kitchen aspirin 81 MG chewable tablet Chew 81 mg by mouth daily.    . carvedilol (COREG) 12.5 MG tablet Take 12.5 mg by mouth 2 (two) times daily with a meal.    . cinacalcet (SENSIPAR) 60 MG tablet Take 60 mg by mouth every evening.     Marland Kitchen ELIQUIS 5 MG TABS tablet Take 1 tablet (5 mg total) by mouth 2 (two) times daily. 60 tablet 0  . hydrOXYzine (ATARAX/VISTARIL) 50 MG tablet Take 50 mg by mouth 2 (two) times daily as needed for itching.     . isosorbide mononitrate (IMDUR) 60 MG 24 hr tablet Take 60 mg by mouth daily.     Marland Kitchen levothyroxine (SYNTHROID) 75 MCG tablet Take 75 mcg by mouth daily before breakfast.     . midodrine (PROAMATINE) 10 MG tablet Take 10-20 mg by mouth daily. (for low BP associated with dialysis)    . oxyCODONE-acetaminophen (PERCOCET/ROXICET) 5-325 MG tablet Take 1 tablet by mouth 2 (two) times daily as needed for moderate pain.     . pantoprazole (PROTONIX) 40 MG tablet Take 40 mg by mouth daily.    . sacubitril-valsartan (ENTRESTO) 49-51 MG Take 1 tablet by mouth  2 (two) times daily.     . sevelamer carbonate (RENVELA) 800 MG tablet Take 2,400 mg by mouth 3 (three) times daily with meals. And 1 tablet with snacks    . sucralfate (CARAFATE) 1 g tablet Take 1 g by mouth 2 (two) times daily.    . diclofenac sodium (VOLTAREN) 1 % GEL Apply 4 g topically 4 (four) times daily. (Patient not taking: Reported on 03/30/2019) 100 g 3   Current Facility-Administered Medications on File Prior to Visit  Medication Dose Route Frequency Provider Last Rate Last Admin  . sodium chloride flush (NS) 0.9 % injection 3 mL  3 mL Intravenous Q12H Dionisio David, MD        There are no Patient Instructions on file for this visit. No follow-ups on file.   Kris Hartmann, NP

## 2019-09-26 ENCOUNTER — Telehealth (INDEPENDENT_AMBULATORY_CARE_PROVIDER_SITE_OTHER): Payer: Self-pay

## 2019-09-26 NOTE — Telephone Encounter (Signed)
Patient was seen in our office on 09/21/19 and is now scheduled for a LUE fistulagram with Dr. Lucky Cowboy on 10/06/19 with a 11:00 am arrival time to the [facility]. Patient will do covid testing on 10/04/19 between 8-1 pm at the [facility]. Pre-procedure instructions will be faxed to [facility] [employer].

## 2019-10-04 ENCOUNTER — Other Ambulatory Visit
Admission: RE | Admit: 2019-10-04 | Discharge: 2019-10-04 | Disposition: A | Discharge status: Home / Self Care | Payer: Medicare Other | Source: Ambulatory Visit | Attending: Vascular Surgery | Admitting: Vascular Surgery

## 2019-10-04 ENCOUNTER — Other Ambulatory Visit: Payer: Self-pay

## 2019-10-04 DIAGNOSIS — Z20822 Contact with and (suspected) exposure to covid-19: Secondary | ICD-10-CM | POA: Insufficient documentation

## 2019-10-04 DIAGNOSIS — Z01812 Encounter for preprocedural laboratory examination: Secondary | ICD-10-CM | POA: Diagnosis present

## 2019-10-04 LAB — SARS CORONAVIRUS 2 (TAT 6-24 HRS): SARS Coronavirus 2: NEGATIVE

## 2019-10-05 ENCOUNTER — Other Ambulatory Visit (INDEPENDENT_AMBULATORY_CARE_PROVIDER_SITE_OTHER): Payer: Self-pay | Admitting: Nurse Practitioner

## 2019-10-06 ENCOUNTER — Encounter: Payer: Self-pay | Admitting: Vascular Surgery

## 2019-10-06 ENCOUNTER — Ambulatory Visit: Payer: Medicare Other | Admitting: Anesthesiology

## 2019-10-06 ENCOUNTER — Encounter
Admission: RE | Disposition: A | Discharge status: Home / Self Care | Payer: Self-pay | Source: Home / Self Care | Attending: Vascular Surgery

## 2019-10-06 ENCOUNTER — Other Ambulatory Visit: Payer: Self-pay

## 2019-10-06 ENCOUNTER — Ambulatory Visit
Admission: RE | Admit: 2019-10-06 | Discharge: 2019-10-06 | Disposition: A | Discharge status: Home / Self Care | Payer: Medicare Other | Attending: Vascular Surgery | Admitting: Vascular Surgery

## 2019-10-06 DIAGNOSIS — N179 Acute kidney failure, unspecified: Secondary | ICD-10-CM | POA: Diagnosis not present

## 2019-10-06 DIAGNOSIS — Z951 Presence of aortocoronary bypass graft: Secondary | ICD-10-CM | POA: Insufficient documentation

## 2019-10-06 DIAGNOSIS — Z7982 Long term (current) use of aspirin: Secondary | ICD-10-CM | POA: Insufficient documentation

## 2019-10-06 DIAGNOSIS — Z8249 Family history of ischemic heart disease and other diseases of the circulatory system: Secondary | ICD-10-CM | POA: Diagnosis not present

## 2019-10-06 DIAGNOSIS — Z79890 Hormone replacement therapy (postmenopausal): Secondary | ICD-10-CM | POA: Diagnosis not present

## 2019-10-06 DIAGNOSIS — Z955 Presence of coronary angioplasty implant and graft: Secondary | ICD-10-CM | POA: Diagnosis not present

## 2019-10-06 DIAGNOSIS — Z91040 Latex allergy status: Secondary | ICD-10-CM | POA: Diagnosis not present

## 2019-10-06 DIAGNOSIS — Z881 Allergy status to other antibiotic agents status: Secondary | ICD-10-CM | POA: Diagnosis not present

## 2019-10-06 DIAGNOSIS — Z841 Family history of disorders of kidney and ureter: Secondary | ICD-10-CM | POA: Insufficient documentation

## 2019-10-06 DIAGNOSIS — Z992 Dependence on renal dialysis: Secondary | ICD-10-CM

## 2019-10-06 DIAGNOSIS — I480 Paroxysmal atrial fibrillation: Secondary | ICD-10-CM | POA: Diagnosis not present

## 2019-10-06 DIAGNOSIS — Z8673 Personal history of transient ischemic attack (TIA), and cerebral infarction without residual deficits: Secondary | ICD-10-CM | POA: Diagnosis not present

## 2019-10-06 DIAGNOSIS — I252 Old myocardial infarction: Secondary | ICD-10-CM | POA: Diagnosis not present

## 2019-10-06 DIAGNOSIS — Z79899 Other long term (current) drug therapy: Secondary | ICD-10-CM | POA: Insufficient documentation

## 2019-10-06 DIAGNOSIS — I132 Hypertensive heart and chronic kidney disease with heart failure and with stage 5 chronic kidney disease, or end stage renal disease: Secondary | ICD-10-CM | POA: Insufficient documentation

## 2019-10-06 DIAGNOSIS — I509 Heart failure, unspecified: Secondary | ICD-10-CM | POA: Insufficient documentation

## 2019-10-06 DIAGNOSIS — I2510 Atherosclerotic heart disease of native coronary artery without angina pectoris: Secondary | ICD-10-CM | POA: Diagnosis not present

## 2019-10-06 DIAGNOSIS — F17210 Nicotine dependence, cigarettes, uncomplicated: Secondary | ICD-10-CM | POA: Insufficient documentation

## 2019-10-06 DIAGNOSIS — T82858A Stenosis of vascular prosthetic devices, implants and grafts, initial encounter: Secondary | ICD-10-CM | POA: Insufficient documentation

## 2019-10-06 DIAGNOSIS — Z8619 Personal history of other infectious and parasitic diseases: Secondary | ICD-10-CM | POA: Diagnosis not present

## 2019-10-06 DIAGNOSIS — Y832 Surgical operation with anastomosis, bypass or graft as the cause of abnormal reaction of the patient, or of later complication, without mention of misadventure at the time of the procedure: Secondary | ICD-10-CM | POA: Insufficient documentation

## 2019-10-06 DIAGNOSIS — Z7901 Long term (current) use of anticoagulants: Secondary | ICD-10-CM | POA: Insufficient documentation

## 2019-10-06 DIAGNOSIS — N186 End stage renal disease: Secondary | ICD-10-CM

## 2019-10-06 DIAGNOSIS — T82868A Thrombosis of vascular prosthetic devices, implants and grafts, initial encounter: Secondary | ICD-10-CM

## 2019-10-06 DIAGNOSIS — E785 Hyperlipidemia, unspecified: Secondary | ICD-10-CM | POA: Diagnosis not present

## 2019-10-06 DIAGNOSIS — Z885 Allergy status to narcotic agent status: Secondary | ICD-10-CM | POA: Diagnosis not present

## 2019-10-06 HISTORY — PX: A/V FISTULAGRAM: CATH118298

## 2019-10-06 LAB — POTASSIUM (ARMC VASCULAR LAB ONLY): Potassium (ARMC vascular lab): 4.4 (ref 3.5–5.1)

## 2019-10-06 SURGERY — A/V FISTULAGRAM
Anesthesia: General | Laterality: Left

## 2019-10-06 MED ORDER — FENTANYL CITRATE (PF) 100 MCG/2ML IJ SOLN
INTRAMUSCULAR | Status: AC
  Filled 2019-10-06: qty 2

## 2019-10-06 MED ORDER — HEPARIN SODIUM (PORCINE) 1000 UNIT/ML IJ SOLN
INTRAMUSCULAR | Status: DC | PRN
  Administered 2019-10-06: 3000 [IU] via INTRAVENOUS

## 2019-10-06 MED ORDER — ONDANSETRON HCL 4 MG/2ML IJ SOLN: 4.0000 mg | Freq: Four times a day (QID) | INTRAMUSCULAR | Status: DC | PRN

## 2019-10-06 MED ORDER — PHENYLEPHRINE HCL (PRESSORS) 10 MG/ML IV SOLN
INTRAVENOUS | Status: DC | PRN
  Administered 2019-10-06: 150 ug via INTRAVENOUS
  Administered 2019-10-06: 100 ug via INTRAVENOUS
  Administered 2019-10-06 (×2): 150 ug via INTRAVENOUS
  Administered 2019-10-06: 200 ug via INTRAVENOUS

## 2019-10-06 MED ORDER — METHYLPREDNISOLONE SODIUM SUCC 125 MG IJ SOLR: 125.0000 mg | Freq: Once | INTRAMUSCULAR | Status: DC | PRN

## 2019-10-06 MED ORDER — FAMOTIDINE 20 MG PO TABS: 40.0000 mg | ORAL_TABLET | Freq: Once | ORAL | Status: DC | PRN

## 2019-10-06 MED ORDER — PROPOFOL 500 MG/50ML IV EMUL
INTRAVENOUS | Status: DC | PRN
  Administered 2019-10-06: 100 ug/kg/min via INTRAVENOUS

## 2019-10-06 MED ORDER — KETAMINE HCL 50 MG/ML IJ SOLN
INTRAMUSCULAR | Status: AC
  Filled 2019-10-06: qty 10

## 2019-10-06 MED ORDER — MIDAZOLAM HCL 2 MG/2ML IJ SOLN
INTRAMUSCULAR | Status: AC
  Filled 2019-10-06: qty 2

## 2019-10-06 MED ORDER — DIPHENHYDRAMINE HCL 50 MG/ML IJ SOLN: 50.0000 mg | Freq: Once | INTRAMUSCULAR | Status: DC | PRN

## 2019-10-06 MED ORDER — ONDANSETRON HCL 4 MG/2ML IJ SOLN: 4.0000 mg | Freq: Once | INTRAMUSCULAR | Status: DC | PRN

## 2019-10-06 MED ORDER — LIDOCAINE HCL (PF) 2 % IJ SOLN
INTRAMUSCULAR | Status: AC
  Filled 2019-10-06: qty 5

## 2019-10-06 MED ORDER — EPHEDRINE SULFATE 50 MG/ML IJ SOLN
INTRAMUSCULAR | Status: DC | PRN
  Administered 2019-10-06: 10 mg via INTRAVENOUS

## 2019-10-06 MED ORDER — MIDAZOLAM HCL 2 MG/ML PO SYRP: 8.0000 mg | ORAL_SOLUTION | Freq: Once | ORAL | Status: DC | PRN

## 2019-10-06 MED ORDER — SODIUM CHLORIDE 0.9 % IV SOLN: INTRAVENOUS | Status: DC

## 2019-10-06 MED ORDER — PROPOFOL 500 MG/50ML IV EMUL
INTRAVENOUS | Status: AC
  Filled 2019-10-06: qty 50

## 2019-10-06 MED ORDER — MIDAZOLAM HCL 2 MG/2ML IJ SOLN
INTRAMUSCULAR | Status: DC | PRN
  Administered 2019-10-06: 2 mg via INTRAVENOUS

## 2019-10-06 MED ORDER — CEFAZOLIN SODIUM-DEXTROSE 1-4 GM/50ML-% IV SOLN
1.0000 g | Freq: Once | INTRAVENOUS | Status: AC
  Administered 2019-10-06: 13:00:00 2 g via INTRAVENOUS

## 2019-10-06 MED ORDER — IODIXANOL 320 MG/ML IV SOLN
INTRAVENOUS | Status: DC | PRN
  Administered 2019-10-06: 13:00:00 25 mL

## 2019-10-06 MED ORDER — FENTANYL CITRATE (PF) 100 MCG/2ML IJ SOLN
12.5000 ug | Freq: Once | INTRAMUSCULAR | Status: AC | PRN
  Administered 2019-10-06: 12:00:00 25 ug via INTRAVENOUS

## 2019-10-06 MED ORDER — LIDOCAINE HCL (PF) 2 % IJ SOLN
INTRAMUSCULAR | Status: DC | PRN
  Administered 2019-10-06: 100 mg via INTRADERMAL

## 2019-10-06 MED ORDER — CEFAZOLIN SODIUM-DEXTROSE 1-4 GM/50ML-% IV SOLN
INTRAVENOUS | Status: AC
  Filled 2019-10-06: qty 50

## 2019-10-06 MED ORDER — PHENYLEPHRINE HCL (PRESSORS) 10 MG/ML IV SOLN
INTRAVENOUS | Status: AC
  Filled 2019-10-06: qty 1

## 2019-10-06 MED ORDER — PROPOFOL 10 MG/ML IV BOLUS
INTRAVENOUS | Status: AC
  Filled 2019-10-06: qty 20

## 2019-10-06 MED ORDER — KETAMINE HCL 10 MG/ML IJ SOLN
INTRAMUSCULAR | Status: DC | PRN
  Administered 2019-10-06: 25 mg via INTRAVENOUS

## 2019-10-06 SURGICAL SUPPLY — 8 items
BALLN LUTONIX AV 8X60X75 (BALLOONS) ×2
BALLOON LUTONIX AV 8X60X75 (BALLOONS) IMPLANT
CANNULA 5F STIFF (CANNULA) ×1 IMPLANT
DEVICE PRESTO INFLATION (MISCELLANEOUS) ×1 IMPLANT
PACK ANGIOGRAPHY (CUSTOM PROCEDURE TRAY) ×2 IMPLANT
SHEATH BRITE TIP 6FRX5.5 (SHEATH) ×1 IMPLANT
SUT MNCRL AB 4-0 PS2 18 (SUTURE) ×1 IMPLANT
WIRE MAGIC TOR.035 180C (WIRE) ×1 IMPLANT

## 2019-10-06 NOTE — Op Note (Signed)
[facility]    OPERATIVE NOTE   PROCEDURE: 1.   Left brachiobasilic arteriovenous fistula cannulation under ultrasound guidance 2.   Left arm fistulagram including central venogram 3.   Percutaneous transluminal angioplasty of the anastomosis of the jump graft to the axillary vein with 8 mm diameter by 6 cm likely tonics drug-coated angioplasty balloon  PRE-OPERATIVE DIAGNOSIS: 1. ESRD 2. Poorly functional left brachiobasilic AVF and Artegraft jump graft  POST-OPERATIVE DIAGNOSIS: same as above   SURGEON: Leotis Pain, MD  ANESTHESIA: local with MCS  ESTIMATED BLOOD LOSS: 2 cc  FINDING(S): 1. The fistula and jump graft was patent with no significant stenosis except for the anastomosis of the jump graft to the axillary vein where there was about a 60% stenosis.  The remainder of the central venous circulation was widely patent.  The arteriovenous anastomosis and the distal upper arm jump graft to fistula anastomosis were widely patent.  SPECIMEN(S):  None  CONTRAST: 25 cc  FLUORO TIME: 0.7 minutes  Anesthesia: General  INDICATIONS: [name] is a 59 y.o. female who presents with malfunctioning left brachiobasilic arteriovenous fistula and Artegraft jump graft.  The patient is scheduled for left arm fistulagram.  The patient is aware the risks include but are not limited to: bleeding, infection, thrombosis of the cannulated access, and possible anaphylactic reaction to the contrast.  The patient is aware of the risks of the procedure and elects to proceed forward.  DESCRIPTION: After full informed written consent was obtained, the patient was brought back to the angiography suite and placed supine upon the angiography table.  The patient was connected to monitoring equipment. Moderate conscious sedation was administered with a face to face encounter with the patient throughout the procedure with my supervision of the RN administering medicines and  monitoring the patient's vital signs and mental status throughout from the start of the procedure until the patient was taken to the recovery room. The left arm was prepped and draped in the standard fashion for a percutaneous access intervention.  Under ultrasound guidance, the initial portion near the arterial anastomosis of the original left brachiobasilic arteriovenous fistula was cannulated with a micropuncture needle under direct ultrasound guidance where it was patent and a permanent image was performed.  The microwire was advanced into the fistula and the needle was exchanged for the a microsheath.  I then upsized to a 6 Fr Sheath and imaging was performed.  Hand injections were completed to image the access including the central venous system. This demonstrated that the fistula and jump graft was patent with no significant stenosis except for the anastomosis of the jump graft to the axillary vein where there was about a 60% stenosis.  The remainder of the central venous circulation was widely patent.  The arteriovenous anastomosis and the distal upper arm jump graft to fistula anastomosis were widely patent..  Based on the images, this patient will need intervention to the stenosis of the jump graft to the axillary vein. I then gave the patient 3000 units of intravenous heparin.  I then crossed the stenosis with a Magic Tourqe wire.  Based on the imaging, a 8 mm x 6 cm Lutonix drug-coated angioplasty balloon was selected.  The balloon was centered around the jump graft to axillary vein stenosis and inflated to 8 ATM for 1 minute(s).  On completion imaging, a 10-15% residual stenosis was present.     Based on the completion imaging, no further intervention is necessary.  The  wire and balloon were removed from the sheath.  A 4-0 Monocryl purse-string suture was sewn around the sheath.  The sheath was removed while tying down the suture.  A sterile bandage was applied to the puncture  site.  COMPLICATIONS: None  CONDITION: Stable   Leotis Pain  10/06/2019 1:13 PM   This note was created with Dragon Medical transcription system. Any errors in dictation are purely unintentional.

## 2019-10-06 NOTE — Anesthesia Procedure Notes (Signed)
Performed by: Allean Found, CRNA Airway Equipment and Method: Oral airway

## 2019-10-06 NOTE — Anesthesia Postprocedure Evaluation (Signed)
Anesthesia Post Note  Patient: [name]  Procedure(s) Performed: A/V FISTULAGRAM (Left )  Patient location during evaluation: PACU Anesthesia Type: General Level of consciousness: awake and alert Pain management: pain level controlled Vital Signs Assessment: post-procedure vital signs reviewed and stable Respiratory status: spontaneous breathing, nonlabored ventilation, respiratory function stable and patient connected to nasal cannula oxygen Cardiovascular status: blood pressure returned to baseline and stable Postop Assessment: no apparent nausea or vomiting Anesthetic complications: no     Last Vitals:  Vitals:   10/06/19 1437 10/06/19 1448  BP: 97/63 93/62  Pulse: 68   Resp: 13 15  Temp:    SpO2: 98% 95%    Last Pain:  Vitals:   10/06/19 1448  TempSrc:   PainSc: 0-No pain                 Arita Miss

## 2019-10-06 NOTE — Anesthesia Preprocedure Evaluation (Addendum)
Anesthesia Evaluation  Patient identified by MRN, date of birth, ID band Patient awake    Reviewed: Allergy & Precautions, H&P , NPO status , Patient's Chart, lab work & pertinent test results  History of Anesthesia Complications Negative for: history of anesthetic complications  Airway Mallampati: III  TM Distance: >3 FB Neck ROM: full    Dental  (+) Chipped, Poor Dentition, Dental Advisory Given   Pulmonary neg shortness of breath, pneumonia, COPD, Current Smoker and Patient abstained from smoking.,    breath sounds clear to auscultation       Cardiovascular Exercise Tolerance: Good hypertension, (-) angina+ CAD, + Past MI, + Cardiac Stents and +CHF   Rhythm:Regular Rate:Normal  Cardiac cath 2020:  Prox RCA lesion is 100% stenosed.  Mid LAD lesion is 100% stenosed.  Prox Cx to Mid Cx lesion is 90% stenosed.   All grafts patent, including LIMA to LAD, SVG to Om and PDA, with extesive worsening of native disease. Echo had EF 28%. Treat medically   Neuro/Psych PSYCHIATRIC DISORDERS CVA    GI/Hepatic negative GI ROS, neg GERD  ,(+) Hepatitis -  Endo/Other  negative endocrine ROS  Renal/GU DialysisRenal disease     Musculoskeletal   Abdominal   Peds  Hematology negative hematology ROS (+)   Anesthesia Other Findings Past Medical History: No date: Anemia     Comment:  chronic disease No date: CHF (congestive heart failure) ([facility]) No date: Coronary artery disease No date: Heart failure ([facility]) No date: Hepatitis     Comment:  history of hep c No date: Hyperlipidemia No date: Hypertension No date: Myocardial infarction ([place]) No date: Paroxysmal atrial fibrillation ([place]) No date: Renal failure No date: Renal insufficiency 2011: Stroke [facility])  Past Surgical History: 2013: CORONARY ANGIOPLASTY WITH STENT PLACEMENT No date: CORONARY ARTERY BYPASS GRAFT No date: DIALYSIS FISTULA CREATION 02/23/2019: FLEXIBLE  SIGMOIDOSCOPY; N/A     Comment:  Procedure: FLEXIBLE SIGMOIDOSCOPY;  Surgeon: Lollie Sails, MD;  Location: [facility] ENDOSCOPY;  Service:               Endoscopy;  Laterality: N/A; 08/08/2018: LEFT HEART CATH AND CORS/GRAFTS ANGIOGRAPHY; N/A     Comment:  Procedure: LEFT HEART CATH AND CORS/GRAFTS ANGIOGRAPHY;               Surgeon: Dionisio David, MD;  Location: [place] [facility];  Service: Cardiovascular;  Laterality: N/A; 01/30/2019: LEFT HEART CATH AND CORS/GRAFTS ANGIOGRAPHY; N/A     Comment:  Procedure: LEFT HEART CATH AND CORS/GRAFTS ANGIOGRAPHY;               Surgeon: Dionisio David, MD;  Location: [place] [facility];  Service: Cardiovascular;  Laterality: N/A;     Reproductive/Obstetrics negative OB ROS                            Anesthesia Physical  Anesthesia Plan  ASA: IV  Anesthesia Plan: General   Post-op Pain Management:    Induction: Intravenous  PONV Risk Score and Plan: 3 and Dexamethasone, Ondansetron, Treatment may vary due to age or medical condition, Propofol infusion and TIVA  Airway Management Planned: Nasal Cannula and Natural Airway  Additional Equipment: None  Intra-op  Plan:   Post-operative Plan: Extubation in OR  Informed Consent: I have reviewed the patients History and Physical, chart, labs and discussed the procedure including the risks, benefits and alternatives for the proposed anesthesia with the patient or authorized representative who has indicated his/her understanding and acceptance.     Dental Advisory Given  Plan Discussed with: Anesthesiologist, CRNA and Surgeon  Anesthesia Plan Comments: (Patient consented for risks of anesthesia including but not limited to:  - adverse reactions to medications - damage to teeth, lips or other oral mucosa - sore throat or hoarseness - Damage to heart, brain, lungs or loss of life  Patient voiced understanding.)        Anesthesia Quick Evaluation

## 2019-10-06 NOTE — Transfer of Care (Signed)
Immediate Anesthesia Transfer of Care Note  Patient: Gladys Deckard Current  Procedure(s) Performed: A/V FISTULAGRAM (Left )  Patient Location: PACU  Anesthesia Type:General  Level of Consciousness: awake, alert  and oriented  Airway & Oxygen Therapy: Patient Spontanous Breathing and Patient connected to face mask oxygen  Post-op Assessment: Report given to RN and Post -op Vital signs reviewed and stable  Post vital signs: Reviewed and stable  Last Vitals:  Vitals Value Taken Time  BP    Temp    Pulse 72 10/06/19 1338  Resp 15 10/06/19 1338  SpO2 100 % 10/06/19 1338  Vitals shown include unvalidated device data.  Last Pain:  Vitals:   10/06/19 1327  TempSrc:   PainSc: (P) 0-No pain         Complications: No apparent anesthesia complications

## 2019-10-06 NOTE — Discharge Instructions (Signed)

## 2019-10-06 NOTE — H&P (Signed)
[facility] History & Physical Update  The patient was interviewed and re-examined.  The patient's previous History and Physical has been reviewed and is unchanged.  There is no change in the plan of care. We plan to proceed with the scheduled procedure.  Leotis Pain, MD  10/06/2019, 12:32 PM

## 2019-10-06 NOTE — Anesthesia Procedure Notes (Signed)
Date/Time: 10/06/2019 1:15 PM Performed by: Allean Found, CRNA Pre-anesthesia Checklist: Patient identified, Emergency Drugs available, Suction available, Patient being monitored and Timeout performed Patient Re-evaluated:Patient Re-evaluated prior to induction Oxygen Delivery Method: Circle system utilized Induction Type: IV induction LMA: LMA inserted LMA Size: 4.0 Number of attempts: 1

## 2019-10-09 ENCOUNTER — Encounter: Payer: Self-pay | Admitting: Cardiology

## 2019-11-20 ENCOUNTER — Other Ambulatory Visit (INDEPENDENT_AMBULATORY_CARE_PROVIDER_SITE_OTHER): Payer: Self-pay | Admitting: Vascular Surgery

## 2019-11-20 DIAGNOSIS — Z9862 Peripheral vascular angioplasty status: Secondary | ICD-10-CM

## 2019-11-20 DIAGNOSIS — N186 End stage renal disease: Secondary | ICD-10-CM

## 2019-11-21 ENCOUNTER — Ambulatory Visit (INDEPENDENT_AMBULATORY_CARE_PROVIDER_SITE_OTHER): Payer: Medicare Other

## 2019-11-21 ENCOUNTER — Other Ambulatory Visit: Payer: Self-pay

## 2019-11-21 ENCOUNTER — Ambulatory Visit (INDEPENDENT_AMBULATORY_CARE_PROVIDER_SITE_OTHER): Payer: Medicare Other | Admitting: Nurse Practitioner

## 2019-11-21 ENCOUNTER — Encounter (INDEPENDENT_AMBULATORY_CARE_PROVIDER_SITE_OTHER): Payer: Self-pay | Admitting: Nurse Practitioner

## 2019-11-21 VITALS — BP 96/58 | HR 66 | Resp 16 | Wt 156.0 lb

## 2019-11-21 DIAGNOSIS — I10 Essential (primary) hypertension: Secondary | ICD-10-CM | POA: Diagnosis not present

## 2019-11-21 DIAGNOSIS — Z9862 Peripheral vascular angioplasty status: Secondary | ICD-10-CM | POA: Diagnosis not present

## 2019-11-21 DIAGNOSIS — E785 Hyperlipidemia, unspecified: Secondary | ICD-10-CM

## 2019-11-21 DIAGNOSIS — N186 End stage renal disease: Secondary | ICD-10-CM

## 2019-11-21 DIAGNOSIS — Z992 Dependence on renal dialysis: Secondary | ICD-10-CM

## 2019-11-21 NOTE — Progress Notes (Signed)
Subjective:    Patient ID: [name], female    DOB: 1960/11/27, 59 y.o.   MRN: [id number] Chief Complaint  Patient presents with  . Follow-up    [facility] 6wk post fistula    The patient returns to the office for followup status post intervention of the dialysis access left brachiobasilic AV fistula jump graft. Following the intervention the access function has significantly improved, with better flow rates and improved KT/V. The patient has not been experiencing increased bleeding times following decannulation and the patient denies increased recirculation. The patient denies an increase in arm swelling. At the present time the patient denies hand pain.  The patient underwent intervention on 10/06/2019 including:  PROCEDURE: 1.   Left brachiobasilic arteriovenous fistula cannulation under ultrasound guidance 2.   Left arm fistulagram including central venogram 3.   Percutaneous transluminal angioplasty of the anastomosis of the jump graft to the axillary vein with 8 mm diameter by 6 cm likely tonics drug-coated angioplasty balloon  The patient denies amaurosis fugax or recent TIA symptoms. There are no recent neurological changes noted. The patient denies claudication symptoms or rest pain symptoms. The patient denies history of DVT, PE or superficial thrombophlebitis. The patient denies recent episodes of angina or shortness of breath.    Today the patient has a flow volume of 1433.  The AV fistula jump graft appears to be patent throughout.  Flow volume appears to be normal however a stenotic area in the takeoff of the AV fistula just beyond the anastomosis is seen.     Review of Systems  All other systems reviewed and are negative.      Objective:   Physical Exam Vitals reviewed.  HENT:     Head: Normocephalic.  Cardiovascular:     Rate and Rhythm: Normal rate and regular rhythm.     Pulses: Normal pulses.          Radial pulses are 2+ on the left side.     Heart  sounds: Normal heart sounds.     Arteriovenous access: left arteriovenous access is present.    Comments: Left brachial basilic AV fistula jump graft, good thrill and bruit Pulmonary:     Effort: Pulmonary effort is normal.     Breath sounds: Normal breath sounds.  Neurological:     Mental Status: She is alert.  Psychiatric:        Mood and Affect: Mood normal.        Behavior: Behavior normal.        Thought Content: Thought content normal.        Judgment: Judgment normal.     BP (!) 96/58 (BP Location: Right Arm)   Pulse 66   Resp 16   Wt 156 lb (70.8 kg)   BMI 25.18 kg/m   Past Medical History:  Diagnosis Date  . Anemia    chronic disease  . CHF (congestive heart failure) ([place])   . Coronary artery disease   . Heart failure ([place])   . Hepatitis    history of hep c  . Hyperlipidemia   . Hypertension   . Myocardial infarction ([place])   . Paroxysmal atrial fibrillation ([facility])   . Renal failure   . Renal insufficiency   . Stroke [facility]) 2011    Social History   Socioeconomic History  . Marital status: Single    Spouse name: Not on file  . Number of children: Not on file  . Years of education: Not on  file  . Highest education level: Not on file  Occupational History  . Not on file  Tobacco Use  . Smoking status: Current Some Day Smoker    Packs/day: 0.10    Years: 20.00    Pack years: 2.00    Types: Cigarettes  . Smokeless tobacco: Never Used  . Tobacco comment: smokes one a day  Substance and Sexual Activity  . Alcohol use: No  . Drug use: No  . Sexual activity: Not Currently    Birth control/protection: Post-menopausal  Other Topics Concern  . Not on file  Social History Narrative  . Not on file   Social Determinants of Health   Financial Resource Strain:   . Difficulty of Paying Living Expenses:   Food Insecurity:   . Worried About Charity fundraiser in the Last Year:   . Arboriculturist in the Last Year:   Transportation Needs:   . Lexicographer (Medical):   Marland Kitchen Lack of Transportation (Non-Medical):   Physical Activity:   . Days of Exercise per Week:   . Minutes of Exercise per Session:   Stress:   . Feeling of Stress :   Social Connections:   . Frequency of Communication with Friends and Family:   . Frequency of Social Gatherings with Friends and Family:   . Attends Religious Services:   . Active Member of Clubs or Organizations:   . Attends Archivist Meetings:   Marland Kitchen Marital Status:   Intimate Partner Violence:   . Fear of Current or Ex-Partner:   . Emotionally Abused:   Marland Kitchen Physically Abused:   . Sexually Abused:     Past Surgical History:  Procedure Laterality Date  . A/V FISTULAGRAM Left 03/30/2019   Procedure: A/V FISTULAGRAM;  Surgeon: Algernon Huxley, MD;  Location: [facility];  Service: Cardiovascular;  Laterality: Left;  . A/V FISTULAGRAM Left 10/06/2019   Procedure: A/V FISTULAGRAM;  Surgeon: Algernon Huxley, MD;  Location: [place] [facility];  Service: Cardiovascular;  Laterality: Left;  . CORONARY ANGIOPLASTY WITH STENT PLACEMENT  2013  . CORONARY ARTERY BYPASS GRAFT    . DIALYSIS FISTULA CREATION    . FLEXIBLE SIGMOIDOSCOPY N/A 02/23/2019   Procedure: FLEXIBLE SIGMOIDOSCOPY;  Surgeon: Lollie Sails, MD;  Location: [facility] ENDOSCOPY;  Service: Endoscopy;  Laterality: N/A;  . LEFT HEART CATH AND CORS/GRAFTS ANGIOGRAPHY N/A 08/08/2018   Procedure: LEFT HEART CATH AND CORS/GRAFTS ANGIOGRAPHY;  Surgeon: Dionisio David, MD;  Location: [place] [facility];  Service: Cardiovascular;  Laterality: N/A;  . LEFT HEART CATH AND CORS/GRAFTS ANGIOGRAPHY N/A 01/30/2019   Procedure: LEFT HEART CATH AND CORS/GRAFTS ANGIOGRAPHY;  Surgeon: Dionisio David, MD;  Location: [place] [facility];  Service: Cardiovascular;  Laterality: N/A;    Family History  Problem Relation Age of Onset  . Hypertension Other   . Cancer Other   . Renal Disease Other   . Ovarian cancer Mother   . Kidney disease  Father   . Breast cancer Maternal Aunt   . Breast cancer Maternal Grandmother     Allergies  Allergen Reactions  . Morphine And Related Hives  . Levaquin [Levofloxacin] Itching    Severe itching; prickly sensation  . Latex Rash  . Tape Rash       Assessment & Plan:   1. ESRD on hemodialysis [facility]) Recommend:  The patient is doing well and currently has adequate dialysis access. The patient's dialysis center is  not reporting any major access issues.  However, the flow rates in the patient's dialysis access has a stenosis  This raises concerns that the access is at moderate but not high risk for a problem or thrombosis and should be followed more closely  The patient will follow-up with me in the office in 6 months without an HDA   2. Essential hypertension Continue antihypertensive medications as already ordered, these medications have been reviewed and there are no changes at this time.   3. Hyperlipidemia, unspecified hyperlipidemia type Continue statin as ordered and reviewed, no changes at this time    Current Outpatient Medications on File Prior to Visit  Medication Sig Dispense Refill  . amiodarone (PACERONE) 200 MG tablet Take 200 mg by mouth 2 (two) times daily.    Marland Kitchen aspirin 81 MG chewable tablet Chew 81 mg by mouth daily.    . carvedilol (COREG) 12.5 MG tablet Take 12.5 mg by mouth 2 (two) times daily with a meal.    . cinacalcet (SENSIPAR) 60 MG tablet Take 60 mg by mouth every evening.     Marland Kitchen ELIQUIS 5 MG TABS tablet Take 1 tablet (5 mg total) by mouth 2 (two) times daily. 60 tablet 0  . hydrOXYzine (ATARAX/VISTARIL) 50 MG tablet Take 50 mg by mouth 2 (two) times daily as needed for itching.     . isosorbide mononitrate (IMDUR) 60 MG 24 hr tablet Take 60 mg by mouth daily.     Marland Kitchen levothyroxine (SYNTHROID) 75 MCG tablet Take 75 mcg by mouth daily before breakfast.     . midodrine (PROAMATINE) 10 MG tablet Take 10-20 mg by mouth daily. (for low BP associated with  dialysis)    . oxyCODONE-acetaminophen (PERCOCET/ROXICET) 5-325 MG tablet Take 1 tablet by mouth 2 (two) times daily as needed for moderate pain.     . pantoprazole (PROTONIX) 40 MG tablet Take 40 mg by mouth daily.    . sacubitril-valsartan (ENTRESTO) 49-51 MG Take 1 tablet by mouth 2 (two) times daily.     . sevelamer carbonate (RENVELA) 800 MG tablet Take 2,400 mg by mouth 3 (three) times daily with meals. And 1 tablet with snacks    . sucralfate (CARAFATE) 1 g tablet Take 1 g by mouth 2 (two) times daily.    . diclofenac sodium (VOLTAREN) 1 % GEL Apply 4 g topically 4 (four) times daily. (Patient not taking: Reported on 03/30/2019) 100 g 3   Current Facility-Administered Medications on File Prior to Visit  Medication Dose Route Frequency Provider Last Rate Last Admin  . sodium chloride flush (NS) 0.9 % injection 3 mL  3 mL Intravenous Q12H Neoma Laming A, MD   350 mL at 10/06/19 1212    There are no Patient Instructions on file for this visit. No follow-ups on file.   Kris Hartmann, NP

## 2020-01-15 IMAGING — CR DG CHEST 2V
1 series · 2 of 2 positions shown · non-contrast
Comparison: 06/11/2016

CLINICAL DATA: Chest pain, shortness of breath and chest congestion

EXAM:
CHEST - 2 VIEW

[Series 1: dg chest 2 view · 0.14mm/px · 2 of 2 slices shown]
[im 1/2]
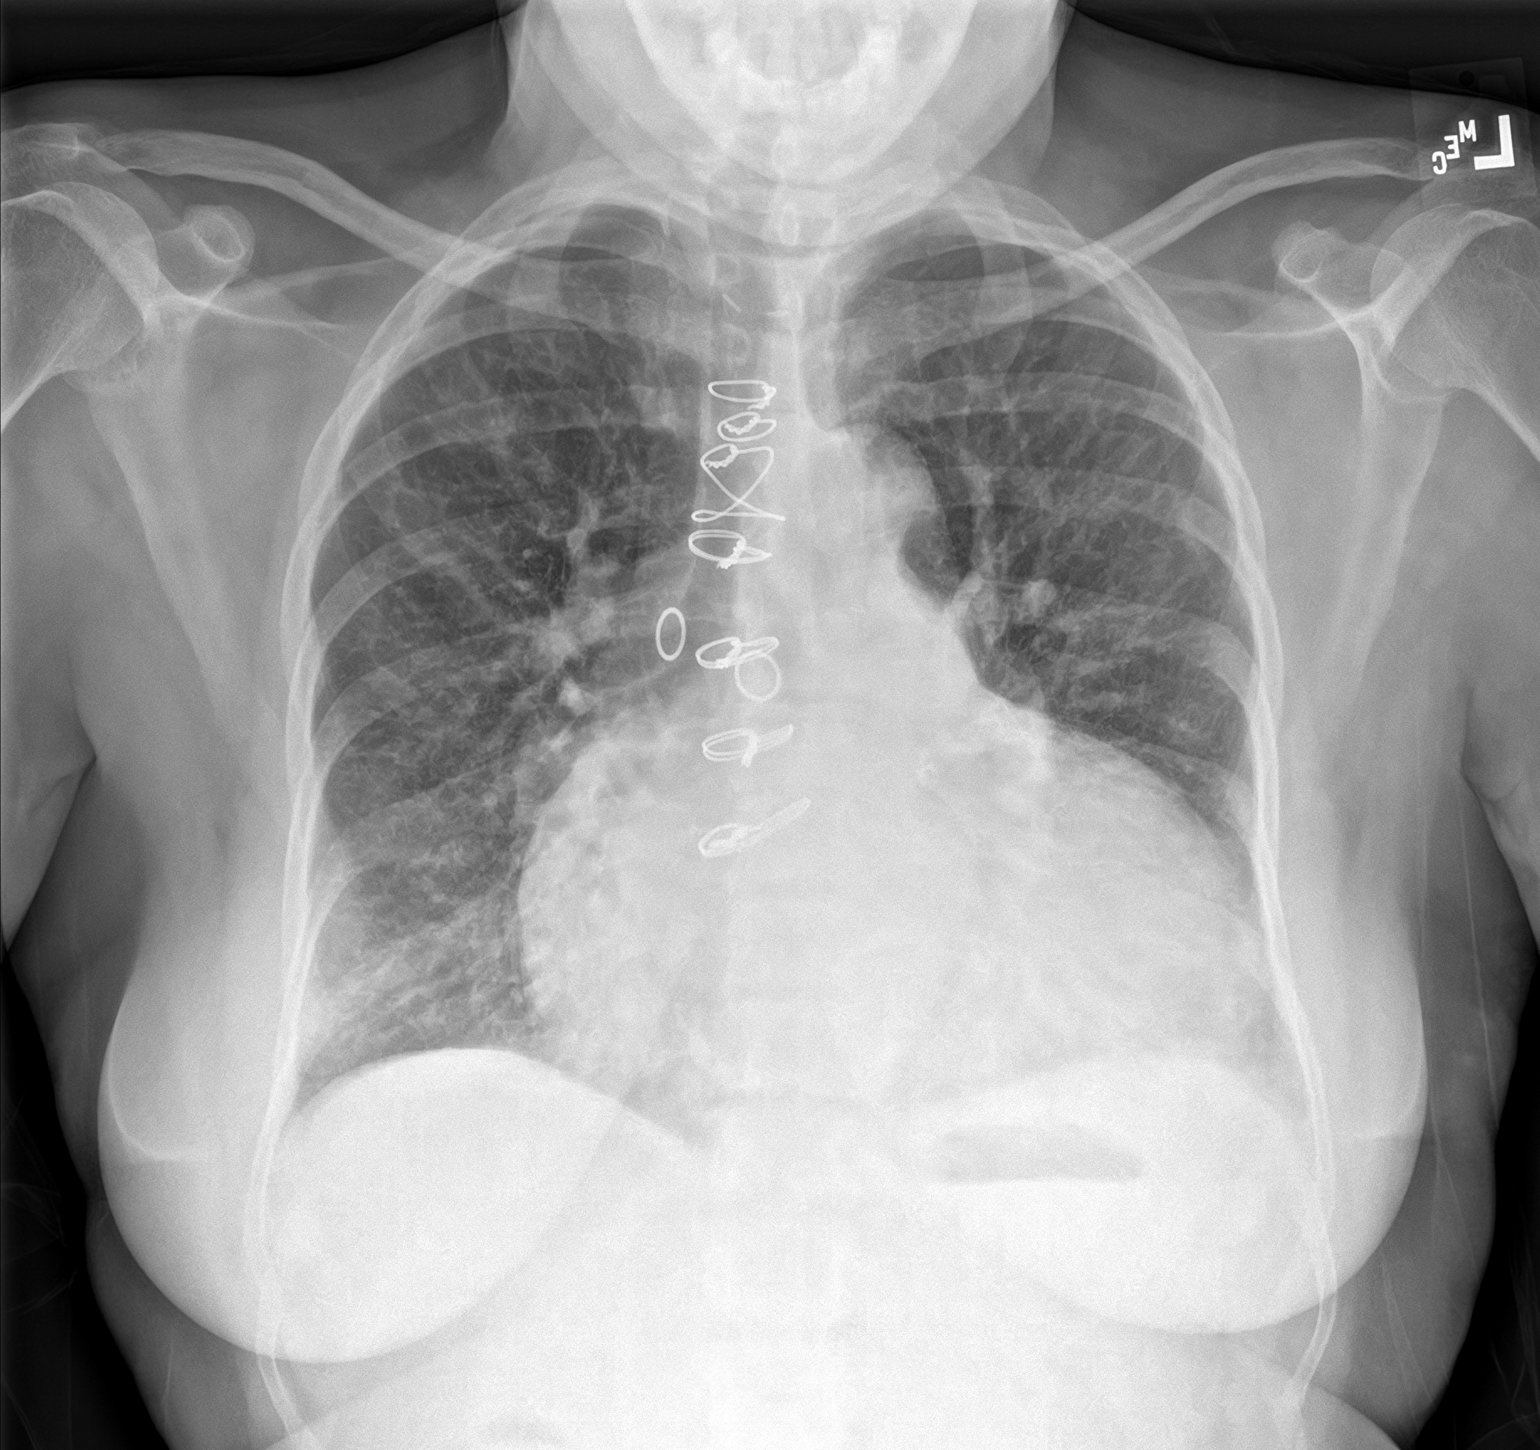
[im 2/2]
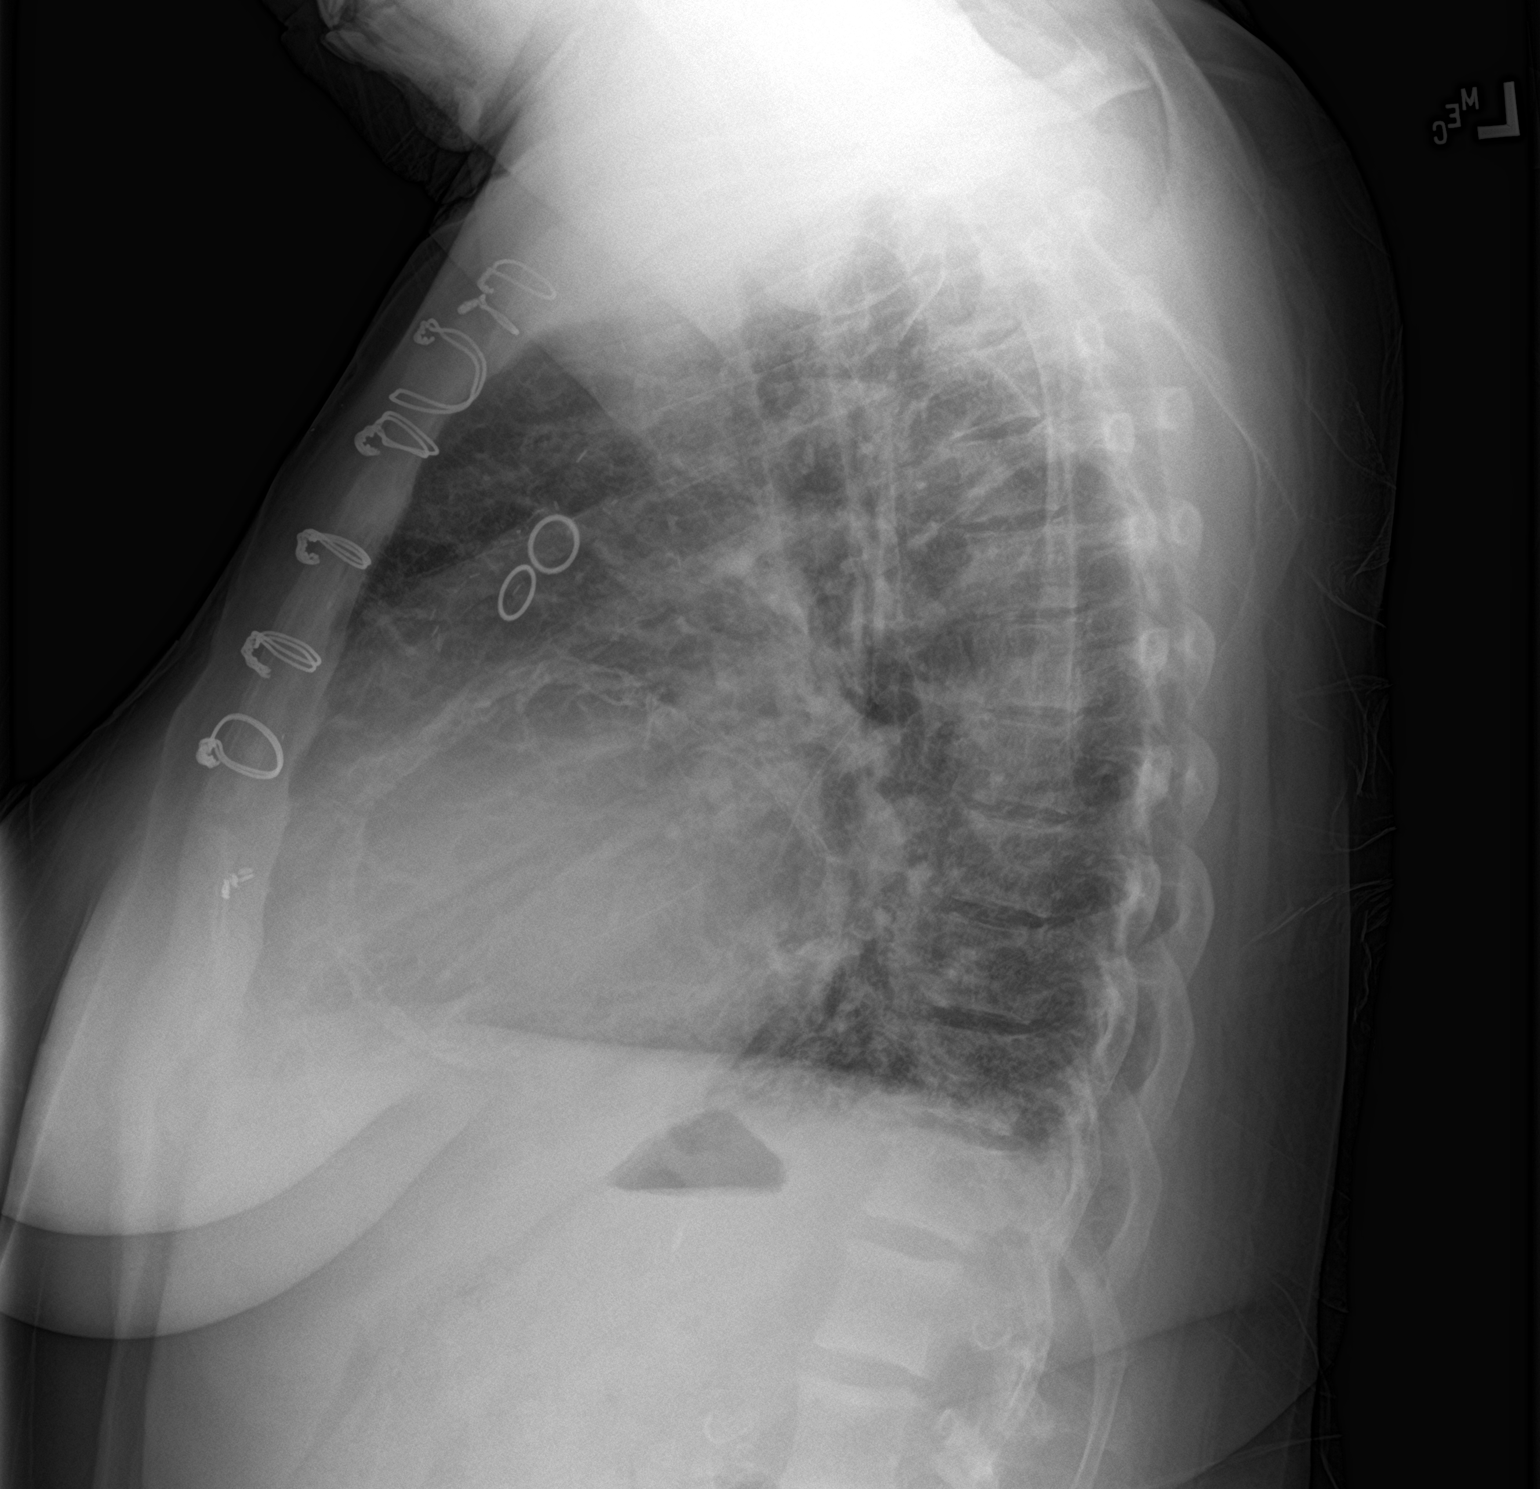

[2 of 2 positions shown; findings below may reference images not displayed]

FINDINGS: Previous median sternotomy and CABG. Chronically enlarged cardiac
silhouette consistent with cardiomegaly and/or pericardial fluid.
Pulmonary venous hypertension with very early interstitial edema. No
infiltrate, collapse or visible effusion.
IMPRESSION: Congestive heart failure with venous hypertension and early
interstitial edema. Enlarged cardiac silhouette, similar to the
previous.

## 2020-01-15 IMAGING — CT CT ANGIO CHEST
2 of 6 series · 17 of 46 positions shown · IV contrast (APPLIED)
Comparison: Chest x-ray 01/31/2018, CT chest 04/11/2014

CLINICAL DATA: Chest pain for 3 days with congestion

EXAM:
CT ANGIOGRAPHY CHEST WITH CONTRAST
TECHNIQUE: Multidetector CT imaging of the chest was performed using the
standard protocol during bolus administration of intravenous
contrast. Multiplanar CT image reconstructions and MIPs were
obtained to evaluate the vascular anatomy.
CONTRAST:  75mL 290T1J-BOF IOPAMIDOL (290T1J-BOF) INJECTION 76%

[Series 6: thins · axial · 0.62mm/px · z∈[-713,-478]mm · 14 of 259 slices shown]
[im 12/259  lung]
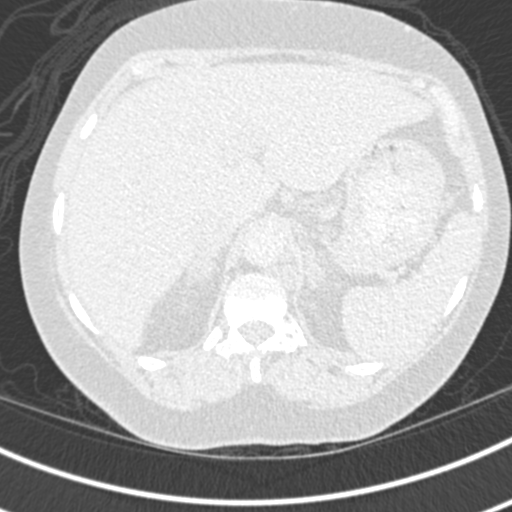
[im 34/259  soft-tissue]
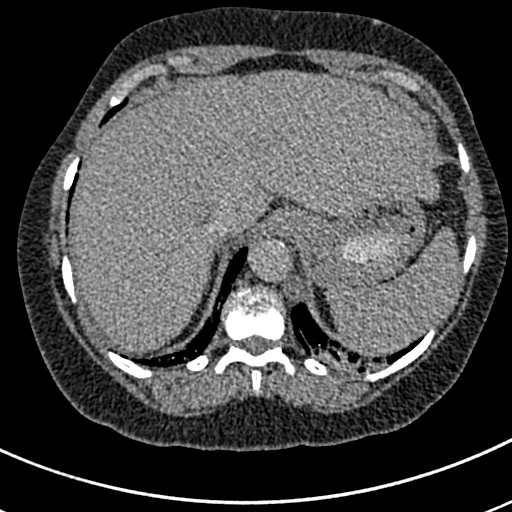
[im 45/259  lung]
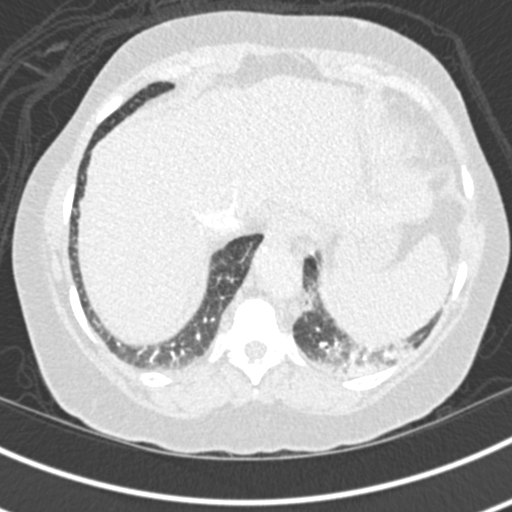
[im 68/259  soft-tissue]
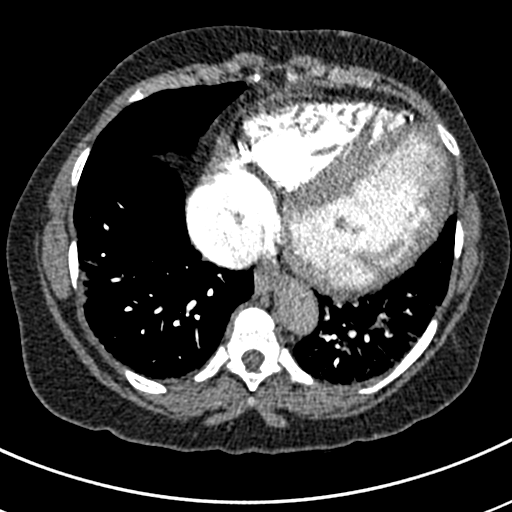
[im 90/259  lung]
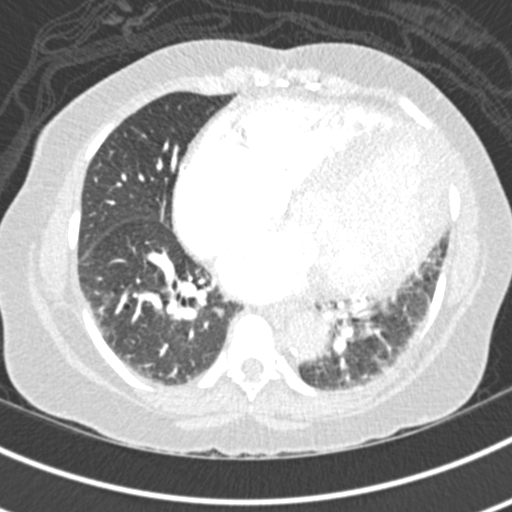
[im 101/259  soft-tissue]
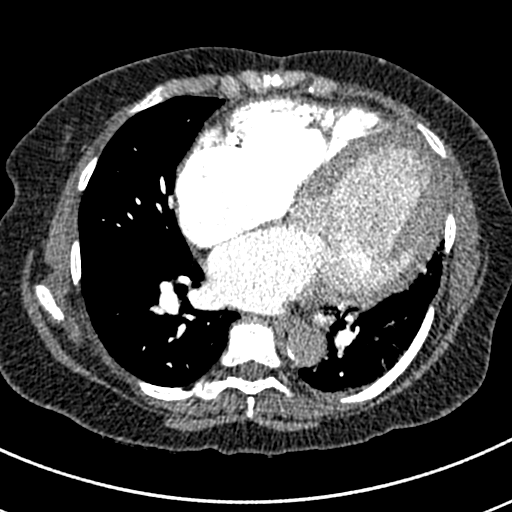
[im 124/259  lung]
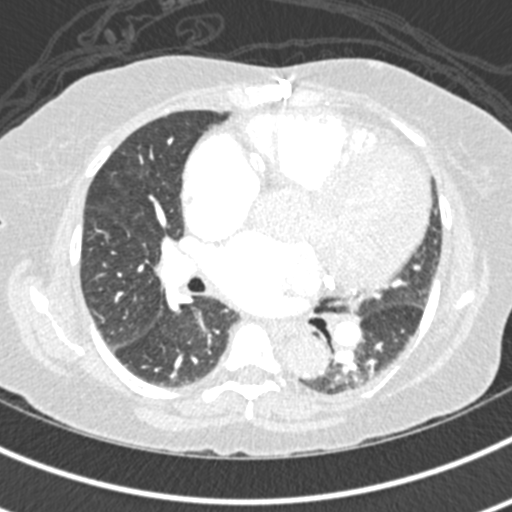
[im 135/259  soft-tissue]
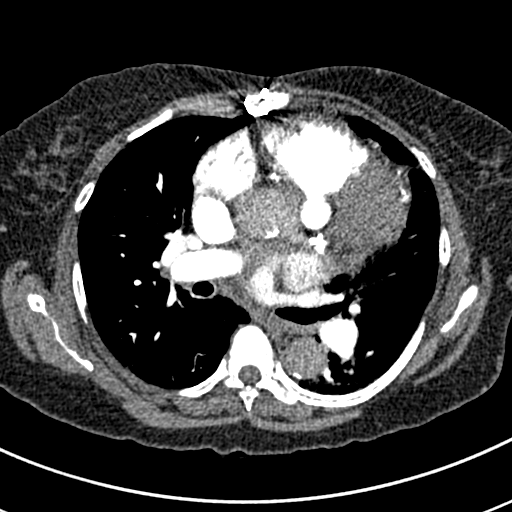
[im 158/259  lung]
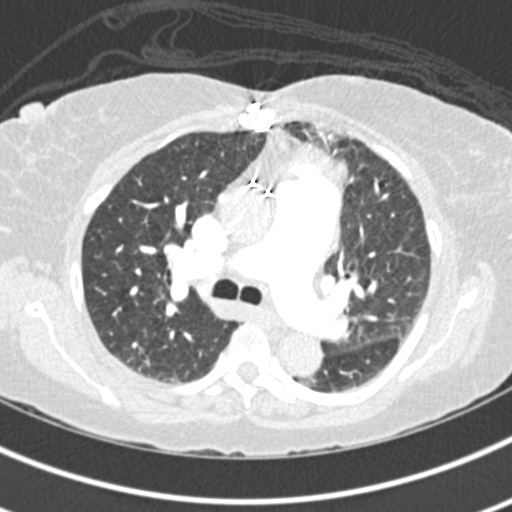
[im 169/259  soft-tissue]
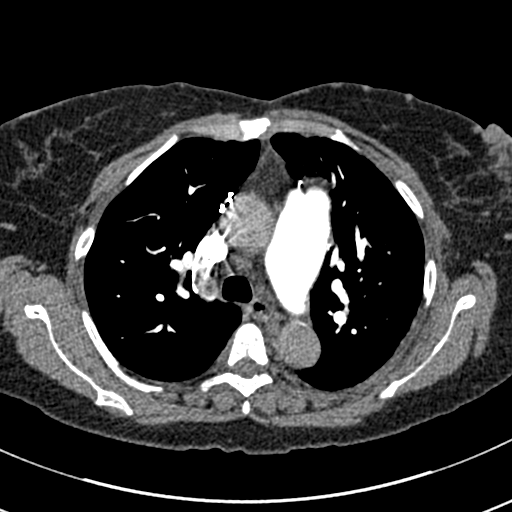
[im 191/259  lung]
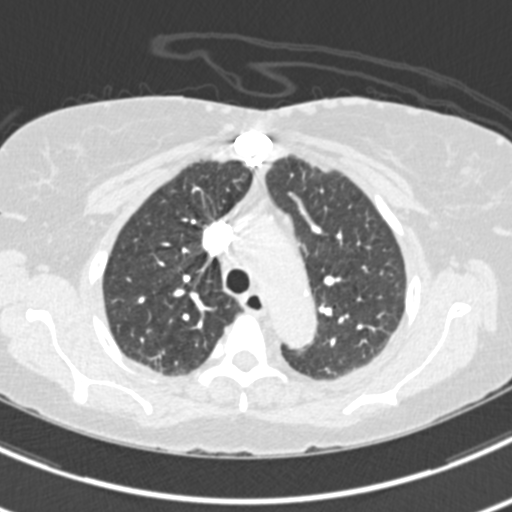
[im 214/259  soft-tissue]
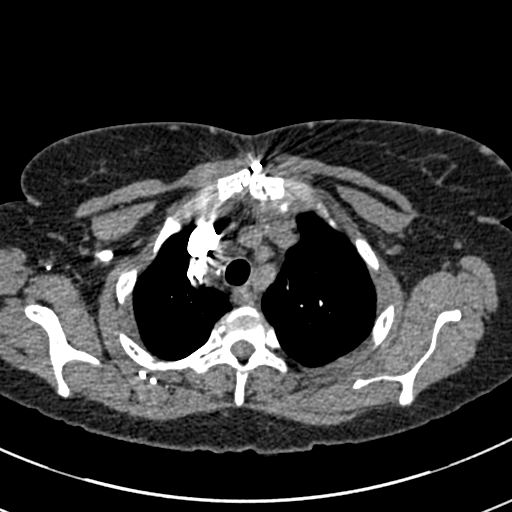
[im 225/259  lung]
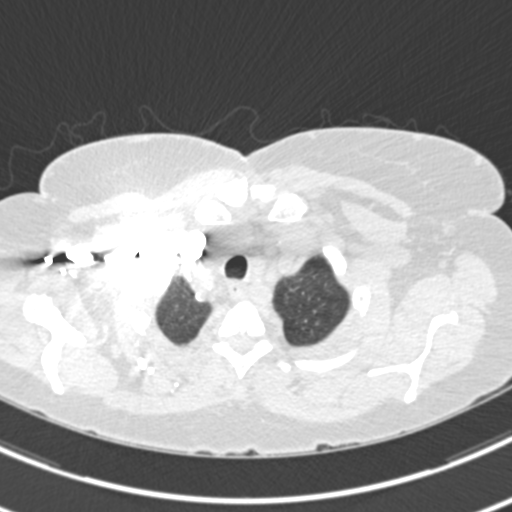
[im 247/259  soft-tissue]
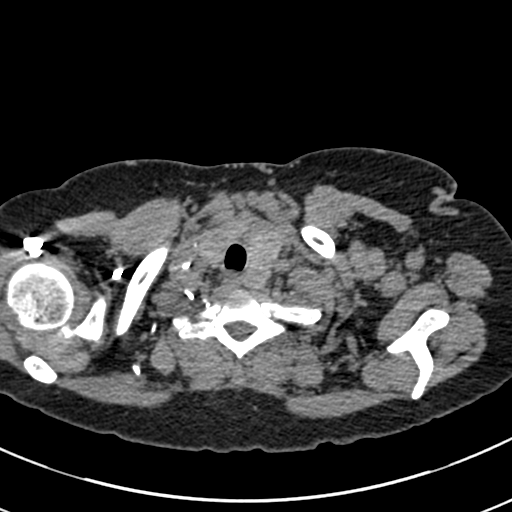

[Series 8: coronal mpr · coronal · 0.51mm/px · 3 of 82 slices shown]
[im 21/82  soft-tissue]
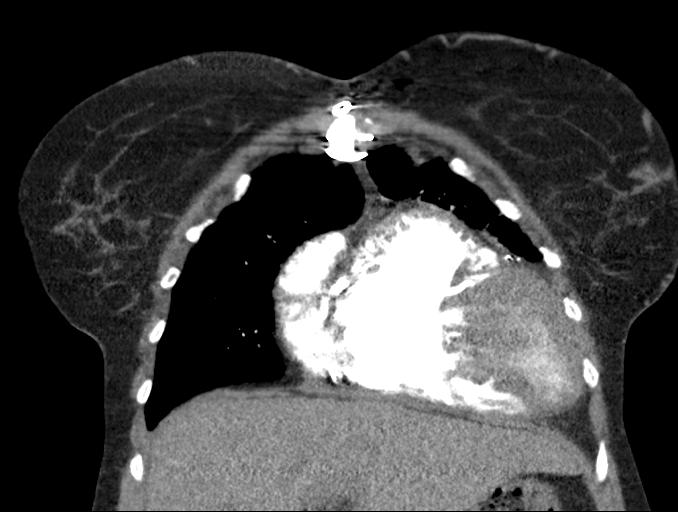
[im 41/82  soft-tissue]
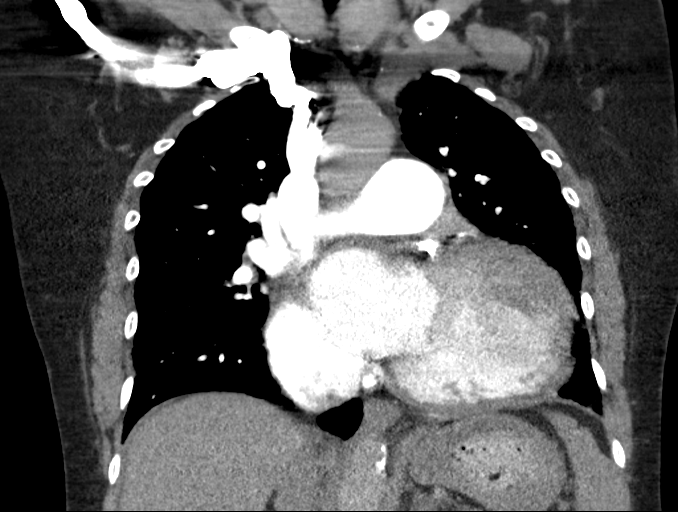
[im 61/82  soft-tissue]
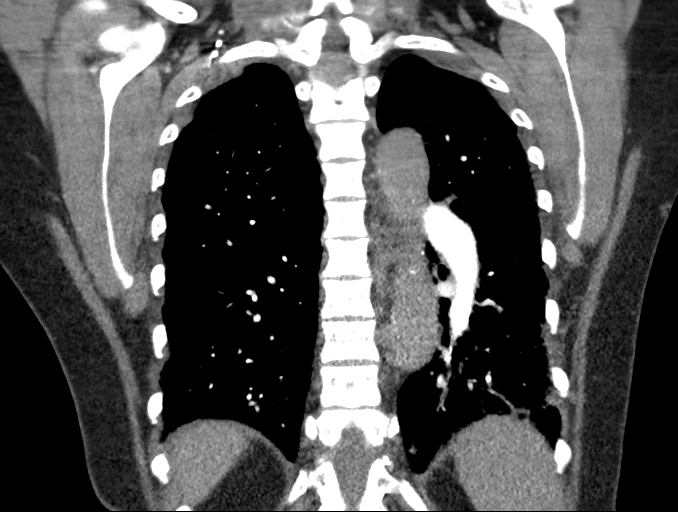

[17 of 46 positions shown; findings below may reference images not displayed]

FINDINGS: Cardiovascular: Satisfactory opacification of the pulmonary arteries
to the segmental level. Small nonocclusive embolus within distal
segmental/subsegmental left lower lobe pulmonary artery, series 5,
image number 180. No other filling defects are visualized.
Nonaneurysmal aorta. Mild aortic atherosclerosis. Post CABG changes.
Dilated pulmonary trunk up to 3.7 cm. Coronary vascular
calcification. Marked cardiomegaly. No pericardial effusion..

Mediastinum/Nodes: Midline trachea. Enlarged thyroid with multiple
calcifications and nodules. Pretracheal lymph nodes measure up to 11
mm in size. Esophagus within normal limits.

Lungs/Pleura: No pleural effusion or pneumothorax. Small foci of
ground-glass density within the posterior left lung base and the
periphery of the right lower lobe.

Upper Abdomen: No acute abnormality.

Musculoskeletal: Post sternotomy changes

Review of the MIP images confirms the above findings.
IMPRESSION: 1. Small nonocclusive acute embolus within left lower lobe distal
segmental/subsegmental branch vessel.
2. Mild ground-glass airspace disease within the posterior left
lower lobe with smaller foci of peripheral ground-glass density in
the right lower lobe, felt most suspicious for infection/mild
pneumonia. Doubt that the findings in left lower lobe reflect
pulmonary infarct given very small size and distribution of the
embolus.
3. Marked cardiomegaly. Dilated pulmonary trunk suggesting arterial
hypertension
4. Enlarged heterogeneous thyroid with multiple nodules.

Critical Value/emergent results were called by telephone at the time
of interpretation on 01/31/2018 at [DATE] to Dr. JING YI HOTCHIN ,
who verbally acknowledged these results.

Aortic Atherosclerosis ([id number]-FXG.G).

## 2020-01-20 IMAGING — CR DG CHEST 2V
2 series · 2 of 2 positions shown · non-contrast
Comparison: Chest x-rays dated 01/31/2018 and 06/11/2016.

CLINICAL DATA: Chest pain and chest tightness. Recent history of
pneumonia.

EXAM:
CHEST - 2 VIEW

[chest pa]
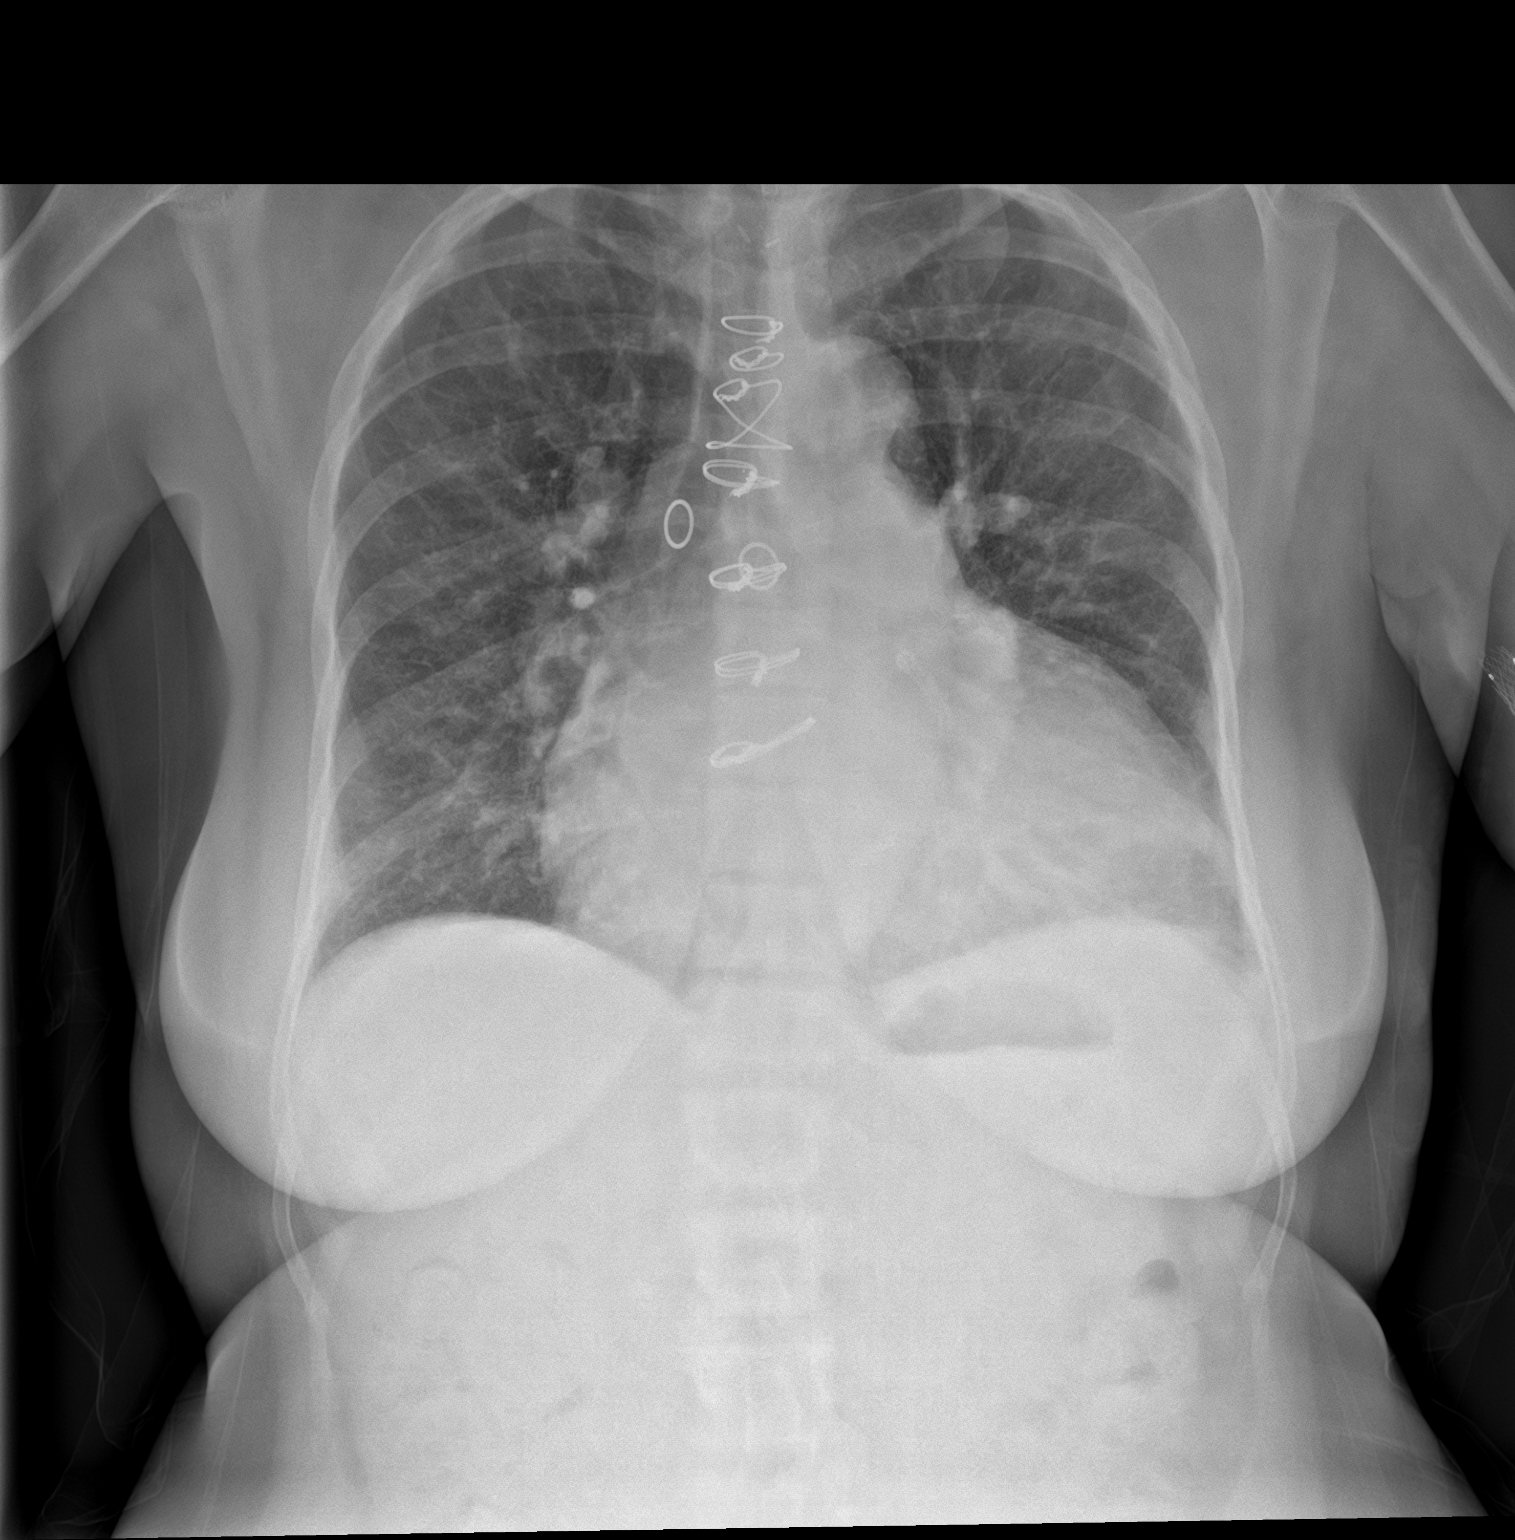

[chest lat]
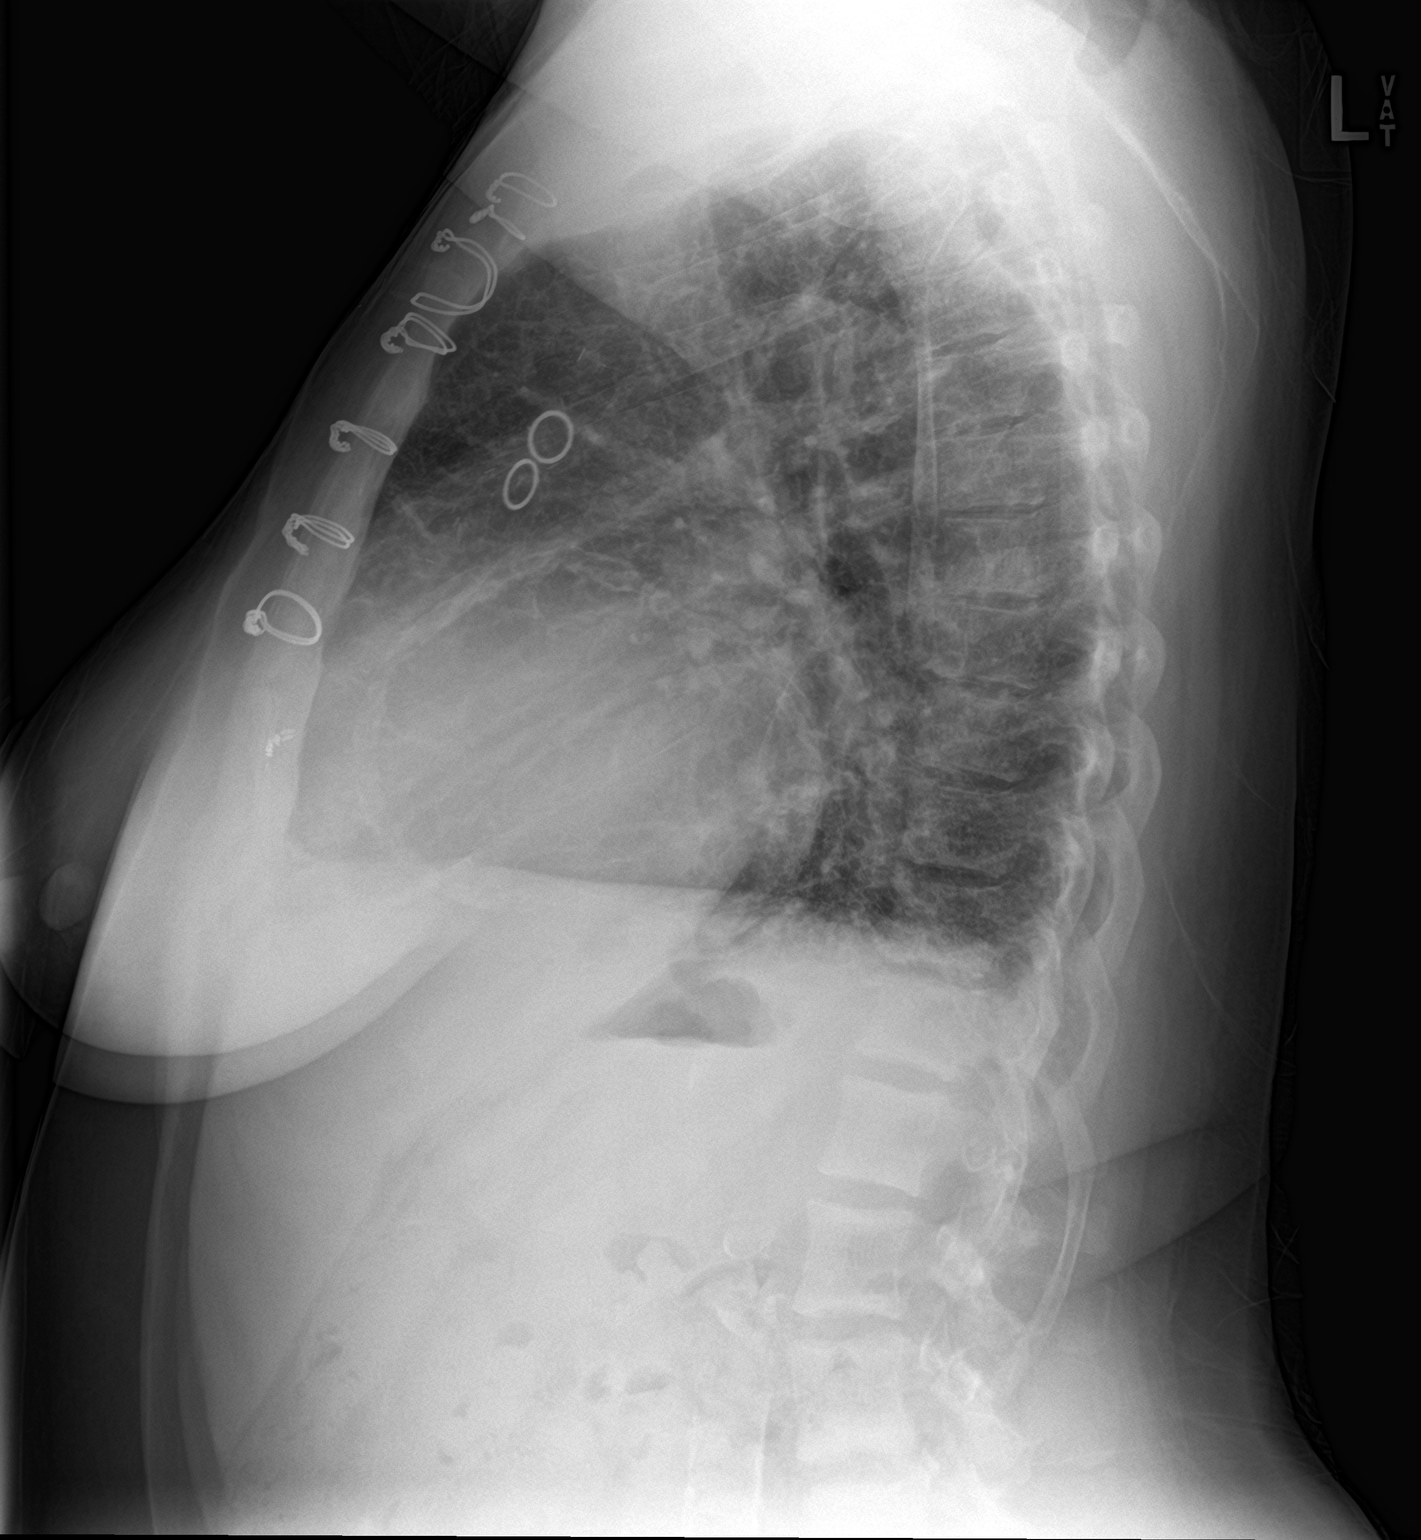

[2 of 2 positions shown; findings below may reference images not displayed]

FINDINGS: Stable cardiomegaly. There is chronic prominence of the
interstitium. No new lung findings. No confluent opacity to suggest
a developing pneumonia. No pleural effusion or pneumothorax seen. No
acute or suspicious osseous finding.
IMPRESSION: 1. No active cardiopulmonary disease. No evidence of pneumonia or
pulmonary edema. No convincing evidence of active CHF.
2. Chronic interstitial thickening, suggesting a mild chronic CHF
and/or chronic interstitial lung disease.

## 2020-02-06 ENCOUNTER — Emergency Department: Payer: Medicare Other

## 2020-02-06 ENCOUNTER — Other Ambulatory Visit: Payer: Self-pay

## 2020-02-06 ENCOUNTER — Emergency Department
Admission: EM | Admit: 2020-02-06 | Discharge: 2020-02-06 | Disposition: A | Discharge status: Home / Self Care | Payer: Medicare Other | Attending: Emergency Medicine | Admitting: Emergency Medicine

## 2020-02-06 DIAGNOSIS — N186 End stage renal disease: Secondary | ICD-10-CM | POA: Insufficient documentation

## 2020-02-06 DIAGNOSIS — I2510 Atherosclerotic heart disease of native coronary artery without angina pectoris: Secondary | ICD-10-CM | POA: Diagnosis not present

## 2020-02-06 DIAGNOSIS — Z91040 Latex allergy status: Secondary | ICD-10-CM | POA: Diagnosis not present

## 2020-02-06 DIAGNOSIS — R531 Weakness: Secondary | ICD-10-CM | POA: Insufficient documentation

## 2020-02-06 DIAGNOSIS — Z79899 Other long term (current) drug therapy: Secondary | ICD-10-CM | POA: Insufficient documentation

## 2020-02-06 DIAGNOSIS — I959 Hypotension, unspecified: Secondary | ICD-10-CM | POA: Diagnosis not present

## 2020-02-06 DIAGNOSIS — I132 Hypertensive heart and chronic kidney disease with heart failure and with stage 5 chronic kidney disease, or end stage renal disease: Secondary | ICD-10-CM | POA: Diagnosis not present

## 2020-02-06 DIAGNOSIS — F17200 Nicotine dependence, unspecified, uncomplicated: Secondary | ICD-10-CM | POA: Diagnosis not present

## 2020-02-06 DIAGNOSIS — Z20822 Contact with and (suspected) exposure to covid-19: Secondary | ICD-10-CM | POA: Diagnosis not present

## 2020-02-06 DIAGNOSIS — I509 Heart failure, unspecified: Secondary | ICD-10-CM | POA: Diagnosis not present

## 2020-02-06 LAB — CBC WITH DIFFERENTIAL/PLATELET
Abs Immature Granulocytes: 0.01 10*3/uL (ref 0.00–0.07)
Basophils Absolute: 0 10*3/uL (ref 0.0–0.1)
Basophils Relative: 0 %
Eosinophils Absolute: 0 10*3/uL (ref 0.0–0.5)
Eosinophils Relative: 1 %
HCT: 31.8 % — ABNORMAL LOW (ref 36.0–46.0)
Hemoglobin: 10.3 g/dL — ABNORMAL LOW (ref 12.0–15.0)
Immature Granulocytes: 0 %
Lymphocytes Relative: 11 %
Lymphs Abs: 0.5 10*3/uL — ABNORMAL LOW (ref 0.7–4.0)
MCH: 32.9 pg (ref 26.0–34.0)
MCHC: 32.4 g/dL (ref 30.0–36.0)
MCV: 101.6 fL — ABNORMAL HIGH (ref 80.0–100.0)
Monocytes Absolute: 0.5 10*3/uL (ref 0.1–1.0)
Monocytes Relative: 11 %
Neutro Abs: 3.9 10*3/uL (ref 1.7–7.7)
Neutrophils Relative %: 77 %
Platelets: 114 10*3/uL — ABNORMAL LOW (ref 150–400)
RBC: 3.13 MIL/uL — ABNORMAL LOW (ref 3.87–5.11)
RDW: 13.7 % (ref 11.5–15.5)
WBC: 5 10*3/uL (ref 4.0–10.5)
nRBC: 0 % (ref 0.0–0.2)

## 2020-02-06 LAB — COMPREHENSIVE METABOLIC PANEL
ALT: 11 U/L (ref 0–44)
AST: 17 U/L (ref 15–41)
Albumin: 3.9 g/dL (ref 3.5–5.0)
Alkaline Phosphatase: 181 U/L — ABNORMAL HIGH (ref 38–126)
Anion gap: 15 (ref 5–15)
BUN: 34 mg/dL — ABNORMAL HIGH (ref 6–20)
CO2: 24 mmol/L (ref 22–32)
Calcium: 8.9 mg/dL (ref 8.9–10.3)
Chloride: 98 mmol/L (ref 98–111)
Creatinine, Ser: 7.55 mg/dL — ABNORMAL HIGH (ref 0.44–1.00)
GFR calc Af Amer: 6 mL/min — ABNORMAL LOW (ref >60)
GFR calc non Af Amer: 5 mL/min — ABNORMAL LOW (ref >60)
Glucose, Bld: 89 mg/dL (ref 70–99)
Potassium: 4.6 mmol/L (ref 3.5–5.1)
Sodium: 137 mmol/L (ref 135–145)
Total Bilirubin: 0.7 mg/dL (ref 0.3–1.2)
Total Protein: 7.4 g/dL (ref 6.5–8.1)

## 2020-02-06 LAB — SARS CORONAVIRUS 2 BY RT PCR (HOSPITAL ORDER, PERFORMED IN ~~LOC~~ HOSPITAL LAB): SARS Coronavirus 2: NEGATIVE

## 2020-02-06 LAB — TROPONIN I (HIGH SENSITIVITY): Troponin I (High Sensitivity): 46 ng/L — ABNORMAL HIGH

## 2020-02-06 MED ORDER — LACTATED RINGERS IV BOLUS
500.0000 mL | Freq: Once | INTRAVENOUS | Status: AC
  Administered 2020-02-06: 500 mL via INTRAVENOUS

## 2020-02-06 MED ORDER — MIDODRINE HCL 5 MG PO TABS: 10.0000 mg | ORAL_TABLET | Freq: Three times a day (TID) | ORAL | Status: DC

## 2020-02-06 MED ORDER — MIDODRINE HCL 5 MG PO TABS
10.0000 mg | ORAL_TABLET | Freq: Once | ORAL | Status: AC
  Administered 2020-02-06: 10 mg via ORAL
  Filled 2020-02-06: qty 2

## 2020-02-06 NOTE — ED Triage Notes (Signed)
Pt in with co generalized weakness since dialysis yesterday. BP per EMS 79'G systolic, pt has had some diarrhea since. They gave her 600 ml of NS en route.  +

## 2020-02-06 NOTE — ED Provider Notes (Signed)
[facility] Emergency Department Provider Note   ____________________________________________   First MD Initiated Contact with Patient 02/06/20 [phone]     (approximate)  I have reviewed the triage vital signs and the nursing notes.   HISTORY  Chief Complaint Weakness    HPI [name] is a 59 y.o. female with past medical history of ESRD on HD (MWF), CAD status post CABG, hypertension, hyperlipidemia, CHF, and atrial fibrillation on Eliquis who presents to the ED complaining of weakness.  Patient states she has been feeling bad ever since she left her dialysis appointment yesterday.  She describes generalized weakness and malaise along with "pain all over".  She states the majority of her pain is at her head, with a headache that is gradually been worsening since yesterday.  She denies any fevers, cough, shortness of breath, or vomiting, did but did have multiple episodes of diarrhea earlier today.  She is not aware of any sick contacts.  She is concerned that they may have taken off too much fluid during her recent dialysis appointment as her drive weight is around 70 kg but she weighed 68 after undergoing dialysis.        Past Medical History:  Diagnosis Date  . Anemia    chronic disease  . CHF (congestive heart failure) ([place])   . Coronary artery disease   . Heart failure ([place])   . Hepatitis    history of hep c  . Hyperlipidemia   . Hypertension   . Myocardial infarction ([place])   . Paroxysmal atrial fibrillation (HCC)   . Renal failure   . Renal insufficiency   . Stroke [facility]) 2011    Patient Active Problem List   Diagnosis Date Noted  . ESRD (end stage renal disease) ([place]) 01/30/2019  . Unstable angina ([place]) 01/23/2019  . Non-STEMI (non-ST elevated myocardial infarction) ([place]) 08/07/2018  . Pneumonia 01/31/2018  . Swelling of limb 09/22/2016  . Kidney dialysis as the cause of abnormal reaction of the patient, or of later complication,  without mention of misadventure at the time of the procedure (CODE) 03/24/2016  . Hyperkalemia 11/13/2014  . Generalized weakness 10/15/2014  . Chest pain 10/15/2014  . ESRD on hemodialysis ([place]) 10/15/2014  . Congestive heart failure (CHF) ([place]) 10/15/2014  . HTN (hypertension) 10/15/2014  . Sternal wound dehiscence 04/12/2014  . 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 Pericardial effusion 04/12/2014  . History of delirium 04/12/2014  . H/O atrial flutter 04/12/2014  . Delayed surgical wound healing 04/12/2014  . Chest wall pain following surgery 04/12/2014  . S/P CABG (coronary artery bypass graft) 04/02/2014  . Lactic acidosis 03/26/2014  . Arteriosclerotic dementia ([place]) 03/25/2014  . Atrial flutter by electrocardiogram ([place]) 03/22/2014  . Superficial incisional surgical site infection 03/14/2014  . Postoperative anemia due to acute blood loss 02/27/2014  . Obesity 02/27/2014  . H/O: substance abuse ([place]) 02/27/2014  . Anemia of chronic renal failure 02/27/2014  . Acute postoperative pain 02/27/2014  . Leukocytosis 02/26/2014  . Hypotension 02/24/2014  . Exhausted vascular access 02/24/2014  . Anemia of chronic disease 02/24/2014  . Stroke ([place]) 02/21/2014  . S/P CABG x 3 02/21/2014  . Dialysis patient ([place]) 02/21/2014  . Hyperlipidemia 02/13/2014  . History of hepatitis C 02/13/2014  . HFrEF (heart failure with reduced ejection fraction) ([place]) 02/13/2014  . H/O stroke without residual deficits 02/13/2014  . ESRD (end stage renal disease) on dialysis ([place]) 02/13/2014  . End stage renal disease with hypertension (  [place]) 02/13/2014  . CAD, multiple vessel 02/13/2014  . Retrosternal chest pain 05/30/2013  . Gallstones 05/29/2013    Past Surgical History:  Procedure Laterality Date  . A/V FISTULAGRAM Left 03/30/2019   Procedure: A/V FISTULAGRAM;  Surgeon: Algernon Huxley, MD;  Location: [place] [facility];  Service: Cardiovascular;  Laterality: Left;  . A/V FISTULAGRAM  Left 10/06/2019   Procedure: A/V FISTULAGRAM;  Surgeon: Algernon Huxley, MD;  Location: [place] [facility];  Service: Cardiovascular;  Laterality: Left;  . CORONARY ANGIOPLASTY WITH STENT PLACEMENT  2013  . CORONARY ARTERY BYPASS GRAFT    . DIALYSIS FISTULA CREATION    . FLEXIBLE SIGMOIDOSCOPY N/A 02/23/2019   Procedure: FLEXIBLE SIGMOIDOSCOPY;  Surgeon: Lollie Sails, MD;  Location: [facility] ENDOSCOPY;  Service: Endoscopy;  Laterality: N/A;  . LEFT HEART CATH AND CORS/GRAFTS ANGIOGRAPHY N/A 08/08/2018   Procedure: LEFT HEART CATH AND CORS/GRAFTS ANGIOGRAPHY;  Surgeon: Dionisio David, MD;  Location: [place] [facility];  Service: Cardiovascular;  Laterality: N/A;  . LEFT HEART CATH AND CORS/GRAFTS ANGIOGRAPHY N/A 01/30/2019   Procedure: LEFT HEART CATH AND CORS/GRAFTS ANGIOGRAPHY;  Surgeon: Dionisio David, MD;  Location: [place] [facility];  Service: Cardiovascular;  Laterality: N/A;    Prior to Admission medications   Medication Sig Start Date End Date Taking? Authorizing Provider  amiodarone (PACERONE) 200 MG tablet Take 200 mg by mouth 2 (two) times daily. 01/10/19   [provider]  aspirin 81 MG chewable tablet Chew 81 mg by mouth daily.    [provider]  carvedilol (COREG) 12.5 MG tablet Take 12.5 mg by mouth 2 (two) times daily with a meal.    [provider]  cinacalcet (SENSIPAR) 60 MG tablet Take 60 mg by mouth every evening.     [provider]  diclofenac sodium (VOLTAREN) 1 % GEL Apply 4 g topically 4 (four) times daily. Patient not taking: Reported on 03/30/2019 12/22/18   Schuman, Stefanie Libel, MD  ELIQUIS 5 MG TABS tablet Take 1 tablet (5 mg total) by mouth 2 (two) times daily. 02/02/18   Mayo, Pete Pelt, MD  hydrOXYzine (ATARAX/VISTARIL) 50 MG tablet Take 50 mg by mouth 2 (two) times daily as needed for itching.     [provider]  isosorbide mononitrate (IMDUR) 60 MG 24 hr tablet Take 60 mg by mouth daily.  09/14/18   [provider]  levothyroxine (SYNTHROID) 75 MCG tablet Take 75 mcg by mouth daily before breakfast.  11/02/18   [provider]  midodrine (PROAMATINE) 10 MG tablet Take 10-20 mg by mouth daily. (for low BP associated with dialysis)    [provider]  oxyCODONE-acetaminophen (PERCOCET/ROXICET) 5-325 MG tablet Take 1 tablet by mouth 2 (two) times daily as needed for moderate pain.  12/08/18   [provider]  pantoprazole (PROTONIX) 40 MG tablet Take 40 mg by mouth daily.    [provider]  sacubitril-valsartan (ENTRESTO) 49-51 MG Take 1 tablet by mouth 2 (two) times daily.  08/01/18   [provider]  sevelamer carbonate (RENVELA) 800 MG tablet Take 2,400 mg by mouth 3 (three) times daily with meals. And 1 tablet with snacks    [provider]  sucralfate (CARAFATE) 1 g tablet Take 1 g by mouth 2 (two) times daily.    [provider]    Allergies Morphine and related, Levaquin [levofloxacin], Latex, and Tape  Family History  Problem Relation Age of Onset  . Hypertension  Other   . Cancer Other   . Renal Disease Other   . Ovarian cancer Mother   . Kidney disease Father   . Breast cancer Maternal Aunt   . Breast cancer Maternal Grandmother     Social History Social History   Tobacco Use  . Smoking status: Current Some Day Smoker    Packs/day: 0.10    Years: 20.00    Pack years: 2.00    Types: Cigarettes  . Smokeless tobacco: Never Used  . Tobacco comment: smokes one a day  Vaping Use  . Vaping Use: Never used  Substance Use Topics  . Alcohol use: No  . Drug use: No    Review of Systems  Constitutional: No fever/chills.  Positive for generalized weakness and malaise. Eyes: No visual changes. ENT: No sore throat. Cardiovascular: Denies chest pain. Respiratory: Denies shortness of breath. Gastrointestinal: No abdominal pain.  No nausea, no vomiting.  Positive for diarrhea.  No constipation. Genitourinary:  Negative for dysuria. Musculoskeletal: Negative for back pain. Skin: Negative for rash. Neurological: Positive for headaches, negative for focal weakness or numbness.  ____________________________________________   PHYSICAL EXAM:  VITAL SIGNS: ED Triage Vitals  Enc Vitals Group     BP      Pulse      Resp      Temp      Temp src      SpO2      Weight      Height      Head Circumference      Peak Flow      Pain Score      Pain Loc      Pain Edu?      Excl. in [place]?     Constitutional: Alert and oriented. Eyes: Conjunctivae are normal. Head: Atraumatic. Nose: No congestion/rhinnorhea. Mouth/Throat: Mucous membranes are moist. Neck: Normal ROM Cardiovascular: Normal rate, regular rhythm. Grossly normal heart sounds.  Left upper extremity AV fistula with palpable thrill. Respiratory: Normal respiratory effort.  No retractions. Lungs CTAB. Gastrointestinal: Soft and nontender. No distention. Genitourinary: deferred Musculoskeletal: No lower extremity tenderness nor edema. Neurologic:  Normal speech and language. No gross focal neurologic deficits are appreciated. Skin:  Skin is warm, dry and intact. No rash noted. Psychiatric: Mood and affect are normal. Speech and behavior are normal.  ____________________________________________   LABS (all labs ordered are listed, but only abnormal results are displayed)  Labs Reviewed  GASTROINTESTINAL PANEL BY PCR, STOOL (REPLACES STOOL CULTURE)  C DIFFICILE QUICK SCREEN W PCR REFLEX  SARS CORONAVIRUS 2 BY RT PCR (HOSPITAL ORDER, [place] [facility])  CBC WITH DIFFERENTIAL/PLATELET  COMPREHENSIVE METABOLIC PANEL  TROPONIN I (HIGH SENSITIVITY)   ____________________________________________  EKG  ED ECG REPORT I, Blake Divine, the attending physician, personally viewed and interpreted this ECG.   Date: 02/06/2020  EKG Time: 6:54  Rate: 65  Rhythm: normal sinus rhythm  Axis: Normal   Intervals:first-degree A-V block  and right bundle branch block  ST&T Change: None   PROCEDURES  Procedure(s) performed (including Critical Care):  Procedures   ____________________________________________   INITIAL IMPRESSION / ASSESSMENT AND PLAN / ED COURSE       59 year old female with past medical history of ESRD on HD (MWF), CAD status post CABG, hypertension, hyperlipidemia, atrial fibrillation on Eliquis, and CHF who presents to the ED complaining of generalized weakness and malaise since her dialysis appointment yesterday.  It is likely her weakness and malaise is related to hypotension.  She also endorses headache which has gradually been worsening since yesterday.  She has no focal neurologic deficits, no symptoms to suggest meningitis, and low suspicion for [facility] given gradually worsening headache.  We will further assess with head CT.  It is possible that she has been over dialyzed as she reports lower weight than her typical dry weight following dialysis yesterday.  No apparent infectious symptoms to suggest sepsis and other than hypertension her vital signs are reassuring.  She received 500 cc of IV fluids and we will give an additional 500 cc.  We will screen labs and chest x-ray, reassess.  Patient turned over to oncoming provider pending imaging and lab results as well as reassessment.  If her blood pressure were to improve and she were to feel better with unremarkable work-up, patient may be discharged home.      ____________________________________________   FINAL CLINICAL IMPRESSION(S) / ED DIAGNOSES  Final diagnoses:  Generalized weakness  Hypotension, unspecified hypotension type     ED Discharge Orders    None       Note:  This document was prepared using Dragon voice recognition software and may include unintentional dictation errors.   Blake Divine, MD 02/06/20 [phone]

## 2020-02-06 NOTE — ED Notes (Signed)
Pt to Xray and CT scan

## 2020-02-06 NOTE — Discharge Instructions (Addendum)
Please take your home midodrine as needed for low blood pressure.  Please follow-up with your doctor within the next 1 to 2 days for recheck/reevaluation.  Return to the emergency department for any further weakness dizziness lightheadedness, or any other symptom personally concerning to yourself.

## 2020-02-06 NOTE — ED Notes (Signed)
E-signature not working at this time. Pt verbalized understanding of D/C instructions, prescriptions and follow up care with no further questions at this time. Pt in NAD and ambulatory at time of D/C.  

## 2020-02-06 NOTE — ED Provider Notes (Signed)
-----------------------------------------   10:23 AM on 02/06/2020 -----------------------------------------  Patient care assumed from Dr. Charna Archer.  Patient continues to appear quite well.  Patient's work-up does not appear to show any acute abnormality.  CT scans negative, x-ray negative, labs are largely at baseline.  Covid negative.  Patient states she feels much better and wishes to go home.  Patient's blood pressure remains low around 90 systolic, patient states this is quite normal for her.  We will dose the patient's home 10 mg midodrine.  Patient states she feels well and wishes to go home.  We will discharge from the emergency department.  I discussed with the patient if she experiences any lightheadedness she is to return to the emergency department.  Patient agreeable.   Harvest Dark, MD 02/06/20 [phone]

## 2020-02-06 NOTE — ED Notes (Signed)
EDP aware of pt BP. Pt sleeping on stretcher, cardiac monitor enabled.

## 2020-02-09 IMAGING — CR DG CHEST 2V
2 series · 2 of 2 positions shown · non-contrast
Comparison: Radiographs February 05, 2018.

CLINICAL DATA: Chest pain.

EXAM:
CHEST - 2 VIEW

[chest pa]
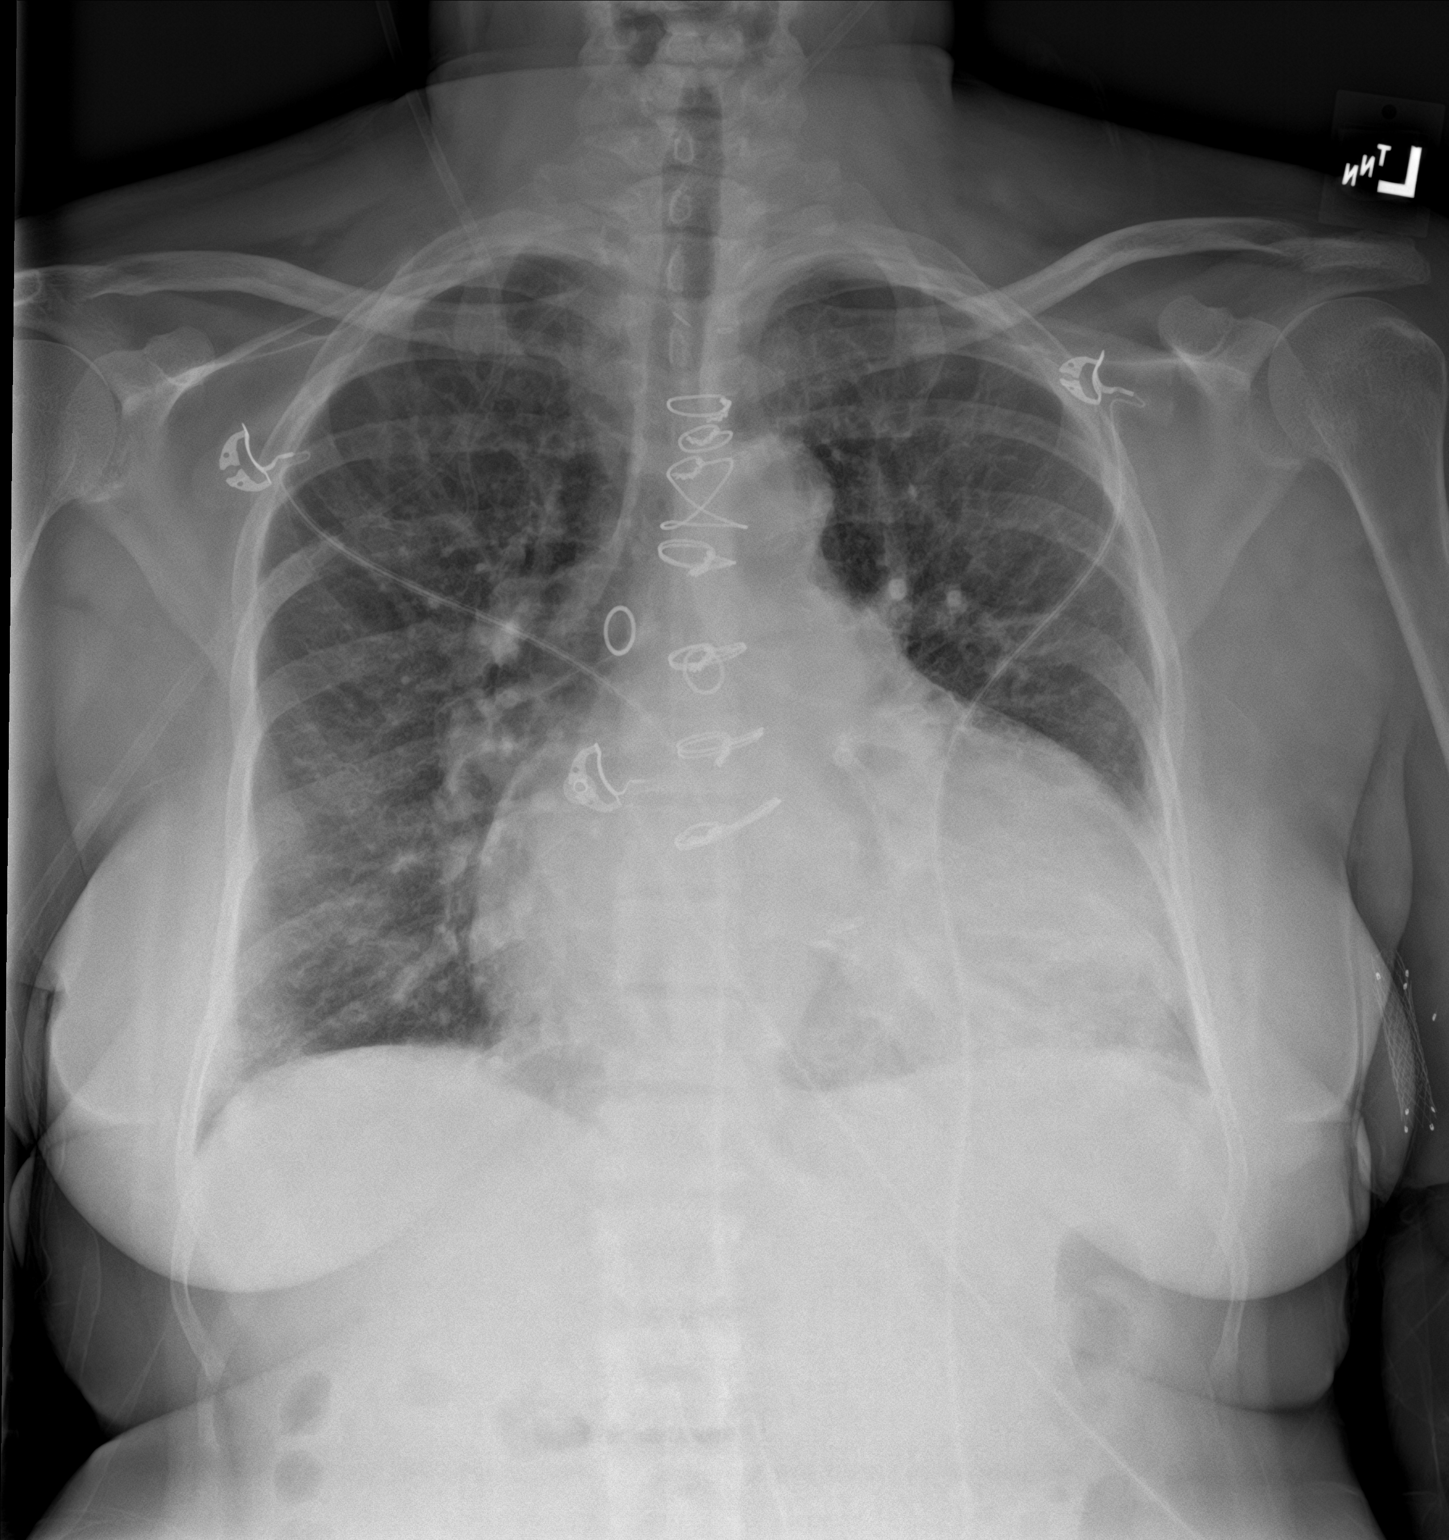

[chest lat]
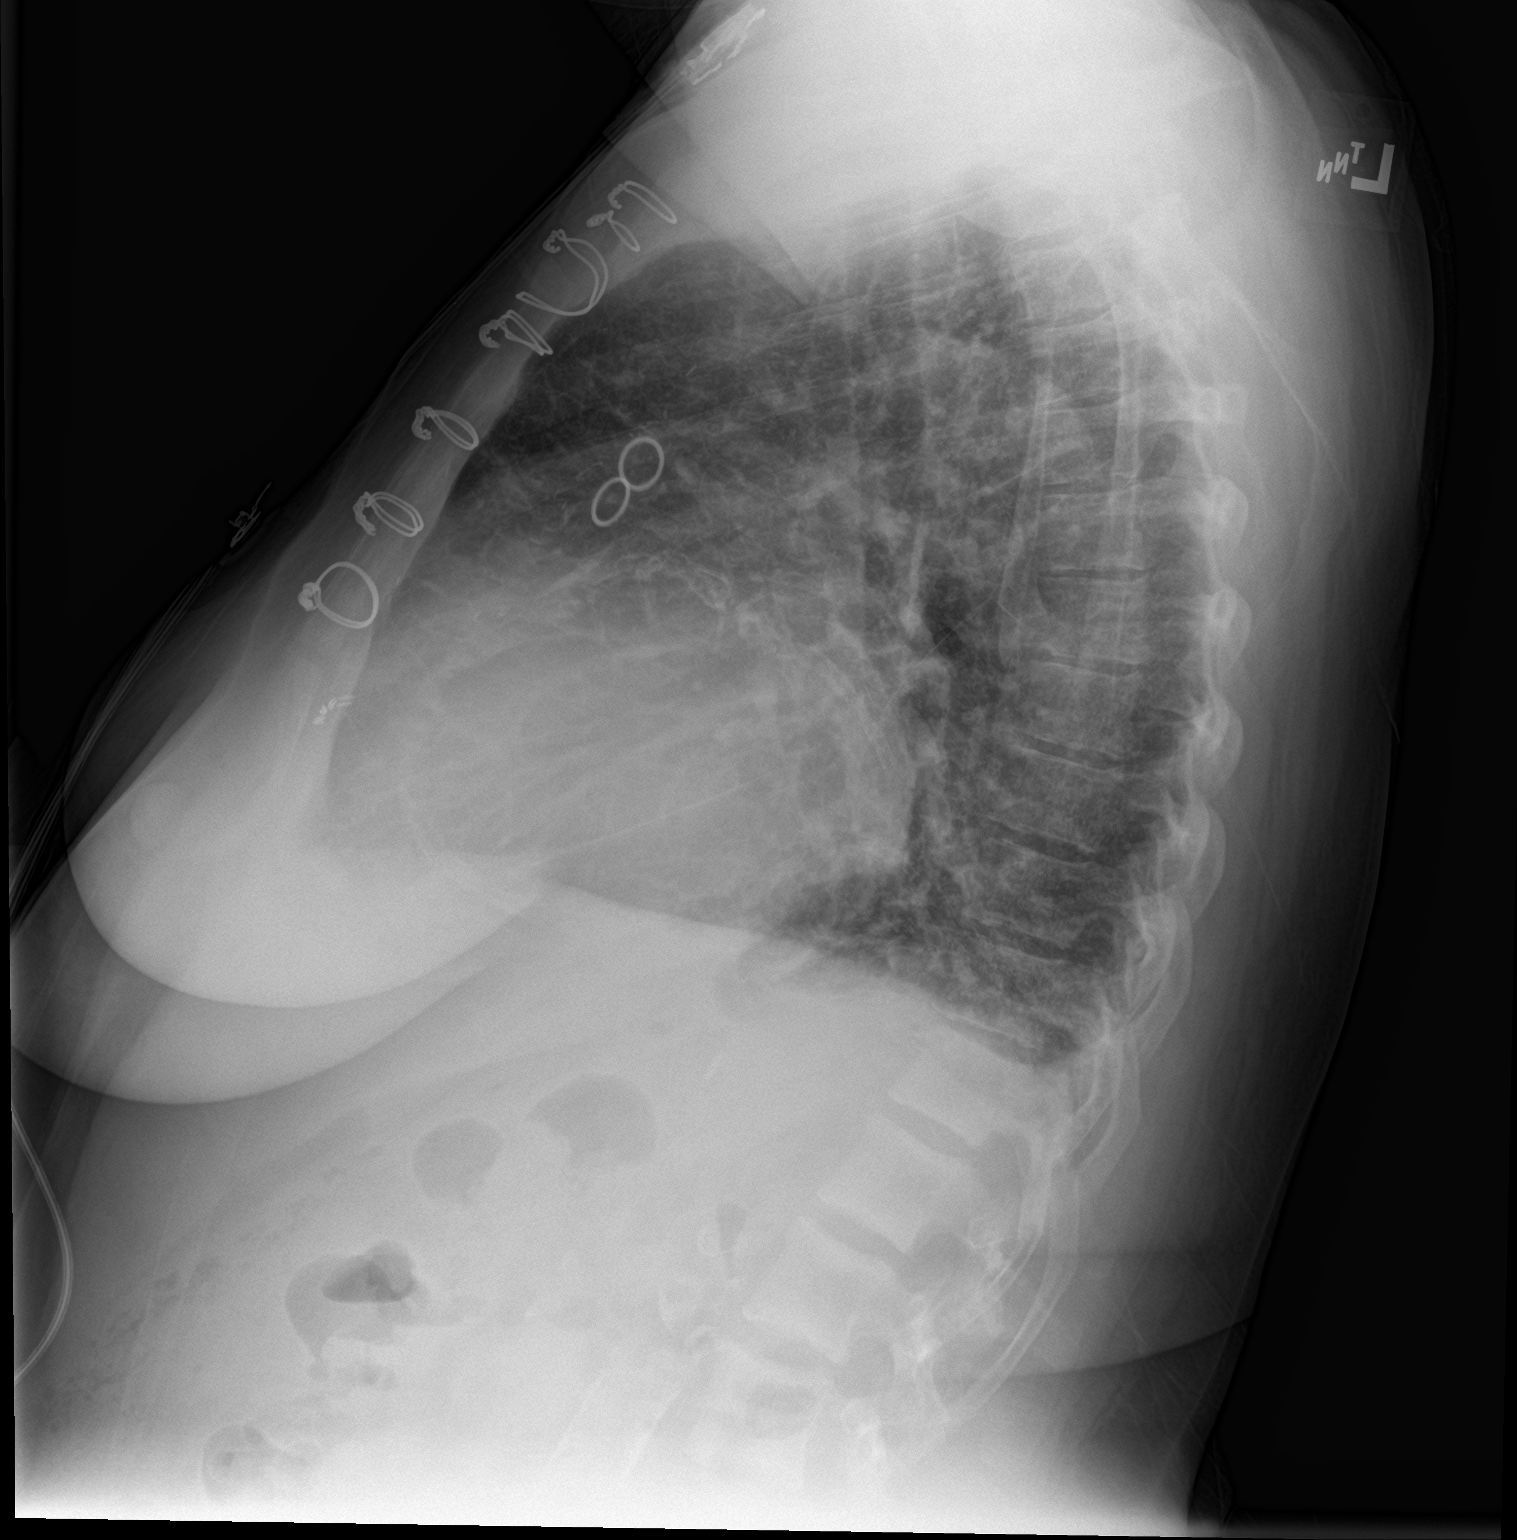

[2 of 2 positions shown; findings below may reference images not displayed]

FINDINGS: Stable cardiomegaly. Status post coronary artery bypass graft. No
pneumothorax or significant pleural effusion is noted. Mild central
pulmonary vascular congestion is noted. No consolidative process is
noted. Bony thorax is unremarkable.
IMPRESSION: Stable cardiomegaly with central pulmonary vascular congestion. No
significant change compared to prior exam.

## 2020-02-09 IMAGING — CT CT ANGIO CHEST
2 of 6 series · 18 of 46 positions shown · IV contrast (APPLIED)
Comparison: 01/31/2018 and 04/03/2014

CLINICAL DATA: Chest pain with vomiting this morning during
dialysis. Recent diagnosis of pneumonia.

EXAM:
CT ANGIOGRAPHY CHEST WITH CONTRAST
TECHNIQUE: Multidetector CT imaging of the chest was performed using the
standard protocol during bolus administration of intravenous
contrast. Multiplanar CT image reconstructions and MIPs were
obtained to evaluate the vascular anatomy.
CONTRAST:  75mL OMNIPAQUE IOHEXOL 350 MG/ML SOLN

[Series 5: thins · axial · 0.61mm/px · z∈[-124,+132]mm · 15 of 282 slices shown]
[im 13/282  lung]
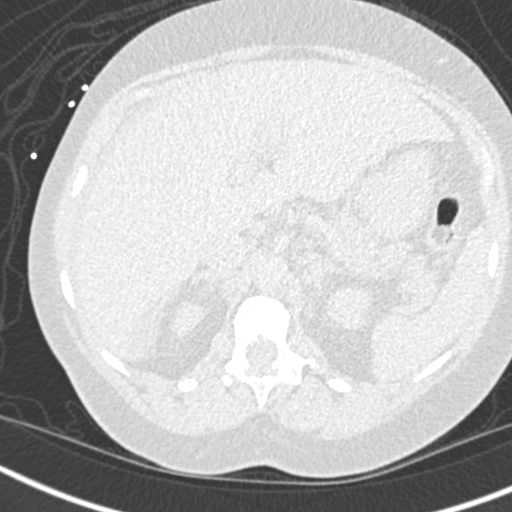
[im 37/282  soft-tissue]
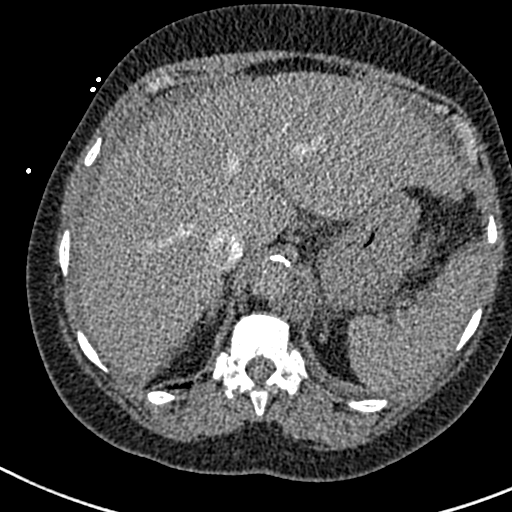
[im 49/282  lung]
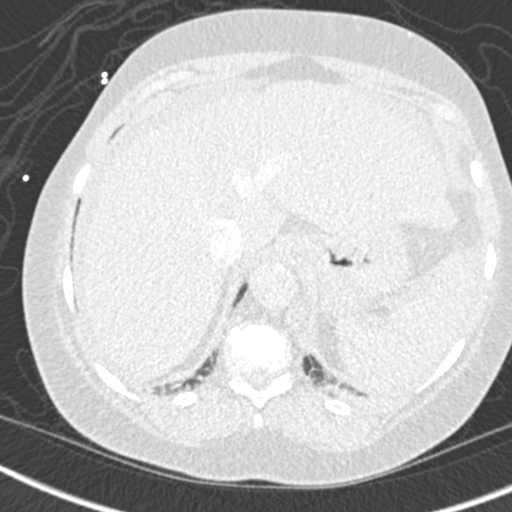
[im 74/282  soft-tissue]
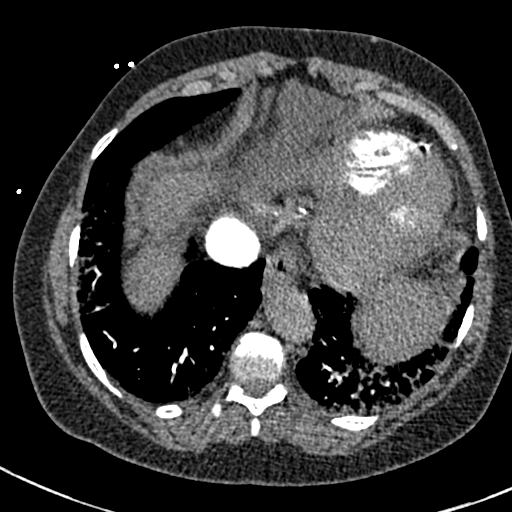
[im 86/282  lung]
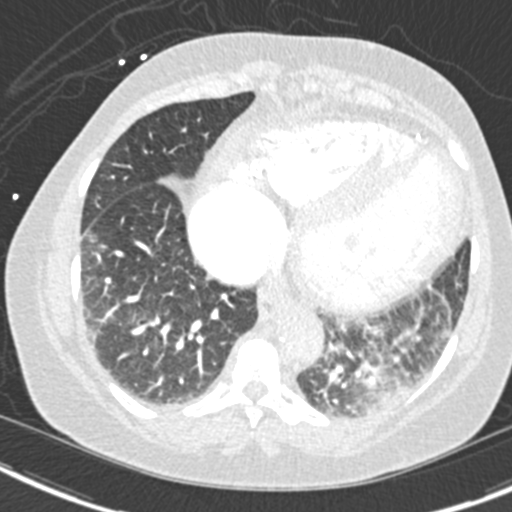
[im 110/282  soft-tissue]
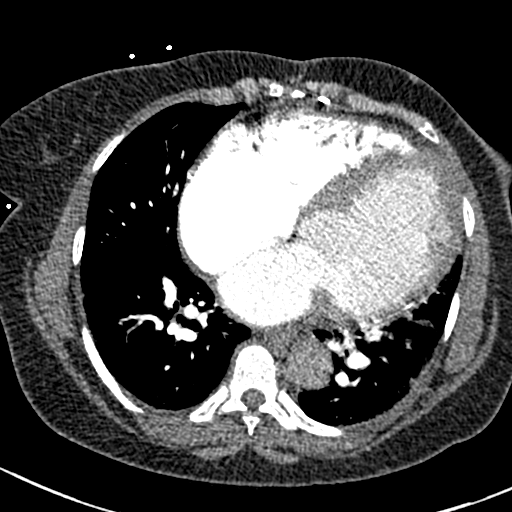
[im 123/282  lung]
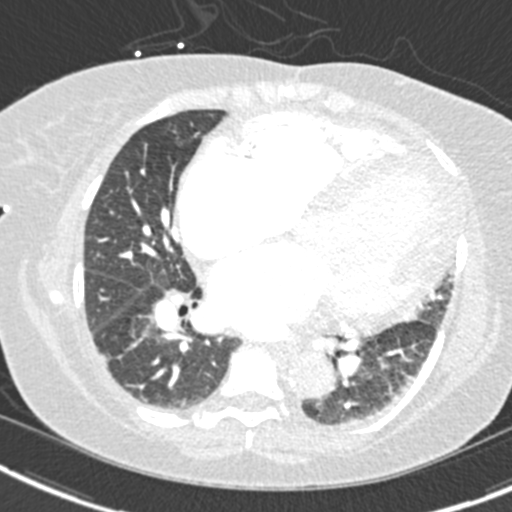
[im 147/282  soft-tissue]
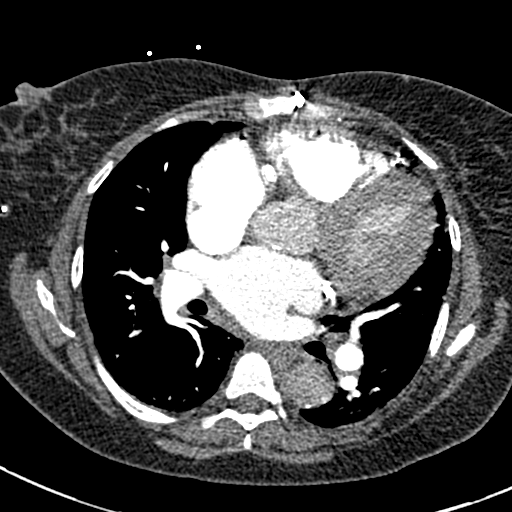
[im 159/282  lung]
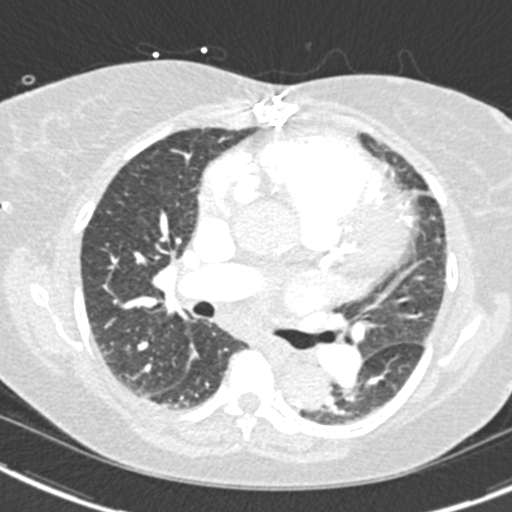
[im 172/282  soft-tissue]
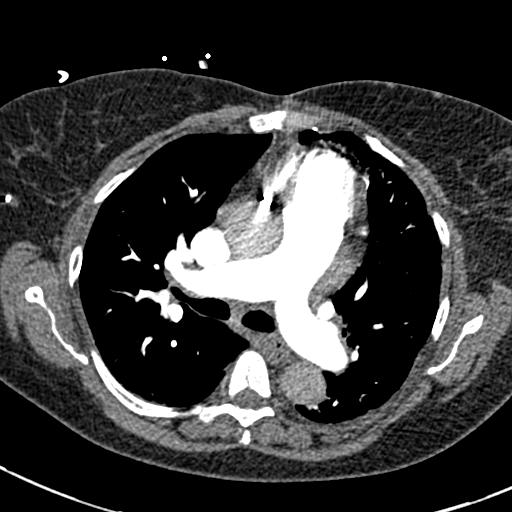
[im 196/282  lung]
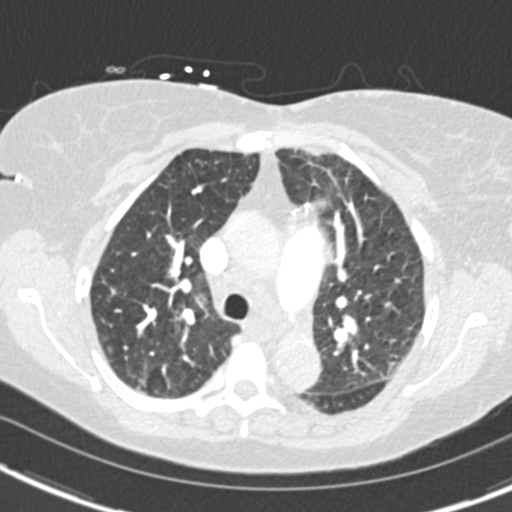
[im 208/282  soft-tissue]
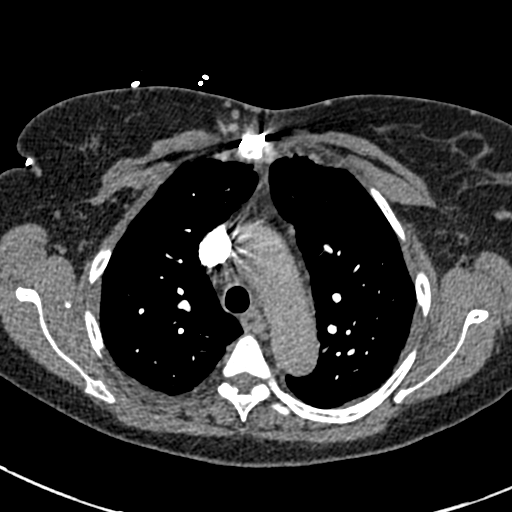
[im 233/282  lung]
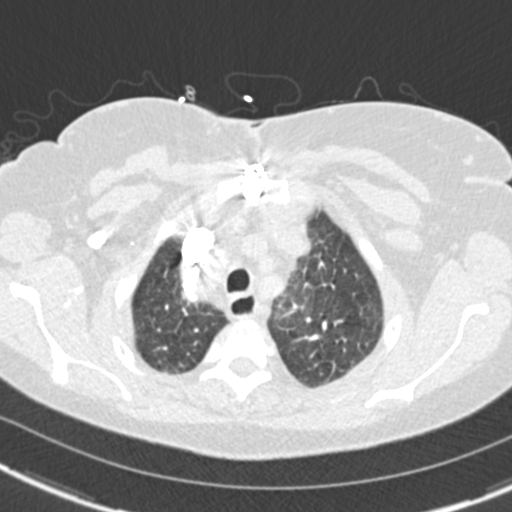
[im 245/282  soft-tissue]
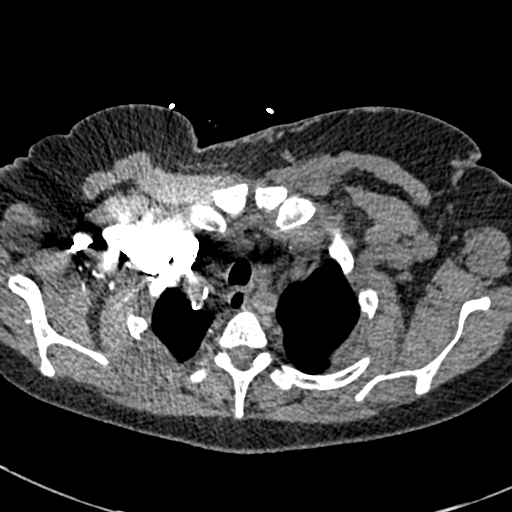
[im 269/282  lung]
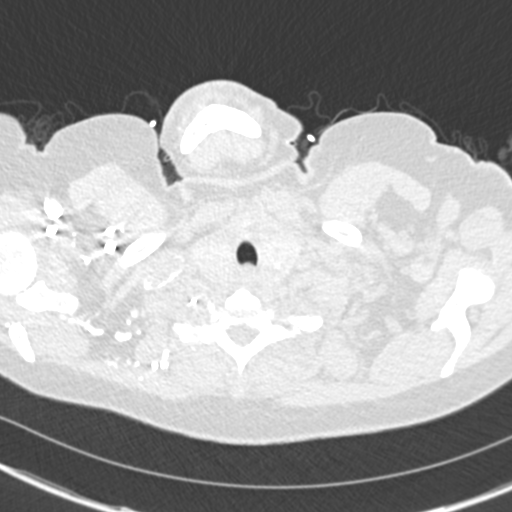

[Series 7: coronal mpr · coronal · 0.55mm/px · 3 of 93 slices shown]
[im 24/93  soft-tissue]
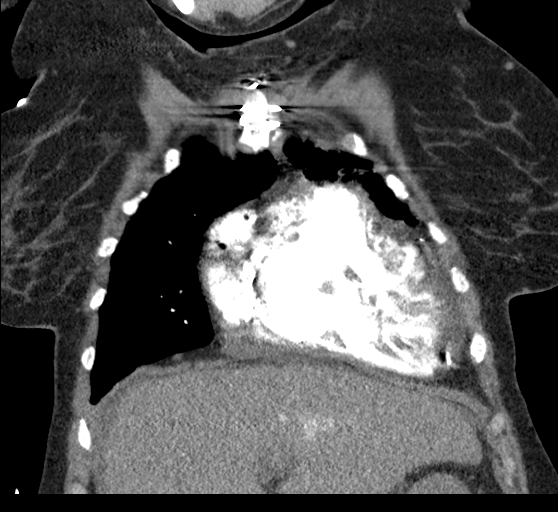
[im 47/93  soft-tissue]
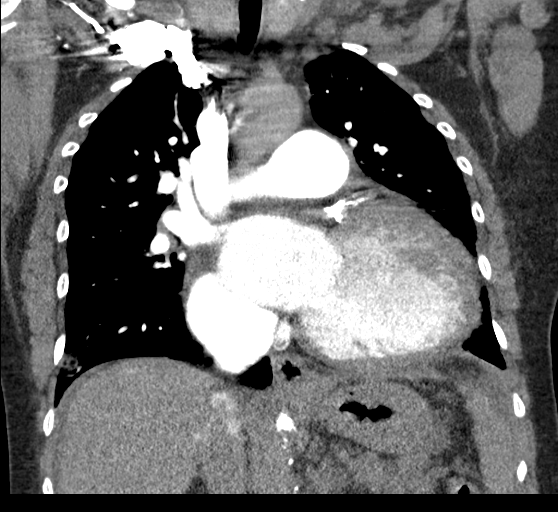
[im 70/93  soft-tissue]
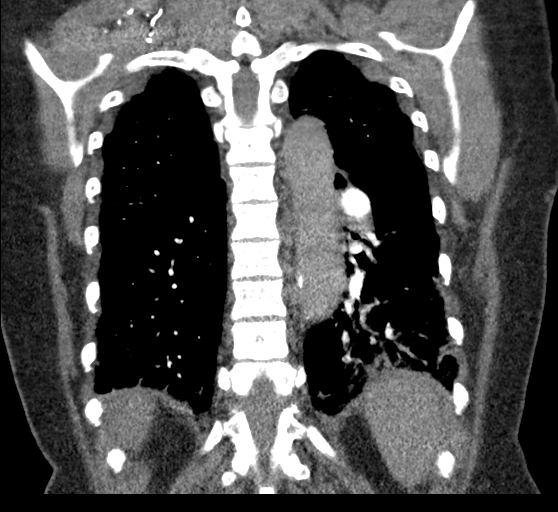

[18 of 46 positions shown; findings below may reference images not displayed]

FINDINGS: Cardiovascular: Stable cardiomegaly. Previous median sternotomy.
Calcified plaque over the coronary arteries. Very minimal calcified
plaque over the thoracic aorta which is normal in caliber. Pulmonary
arterial system is well opacified demonstrates no evidence of
pulmonary emboli. The small embolus seen over the left lower lobe on
the previous exam is not visualized currently.

Mediastinum/Nodes: No significant mediastinal or hilar adenopathy.
Remaining mediastinal structures are unremarkable. Stable goitrous
changes of the thyroid.

Lungs/Pleura: Lungs are adequately inflated and demonstrate
continued mild hazy opacification over the posterior left lower lobe
without significant change and may be due to atelectasis, chronic
infection or subtle infarct associated patient's known small left
lower lobe pulmonary embolism seen on the previous exam. Stable
subtle peripheral hazy opacification over the lateral right lower
lobe. No effusion. Airways are normal.

Upper Abdomen: Small amount of perihepatic fluid stable low-density
over the left retrocrural region likely prominent venous structure
versus low-density lymph node.. Mild calcified plaque over the
abdominal aorta. Remainder of the upper abdomen is unchanged.

Musculoskeletal: Minimal degenerate change of the spine.

Review of the MIP images confirms the above findings.
IMPRESSION: Previously seen small left lower lobe pulmonary embolus is not
visualized on the current exam. Persistent hazy attenuation over the
posterior left lower lobe which may be due to subtle persistent
infarction versus atelectasis or chronic infection. Stable hazy
density over the periphery of the lateral right lower lobe.

Stable cardiomegaly with atherosclerotic coronary artery disease.

Aortic Atherosclerosis (R6TL4-WXP.P).

Small amount of perihepatic fluid unchanged. Remaining chronic
findings as described unchanged.

## 2020-03-18 IMAGING — US ULTRASOUND ABDOMEN COMPLETE
1 series · 13 of 25 positions shown · non-contrast
Comparison: CT scan of May 28, 2018. Ultrasound March 30, 2017.

CLINICAL DATA: Diarrhea.

EXAM:
ABDOMEN ULTRASOUND COMPLETE

[Series 1: ultrasound abdomen complete · 0.22mm/px · 13 of 115 slices shown]
[im 1/115]
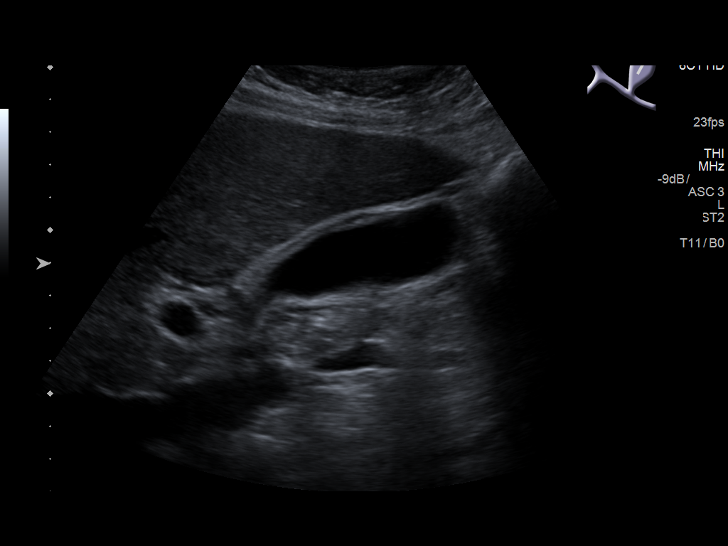
[im 10/115]
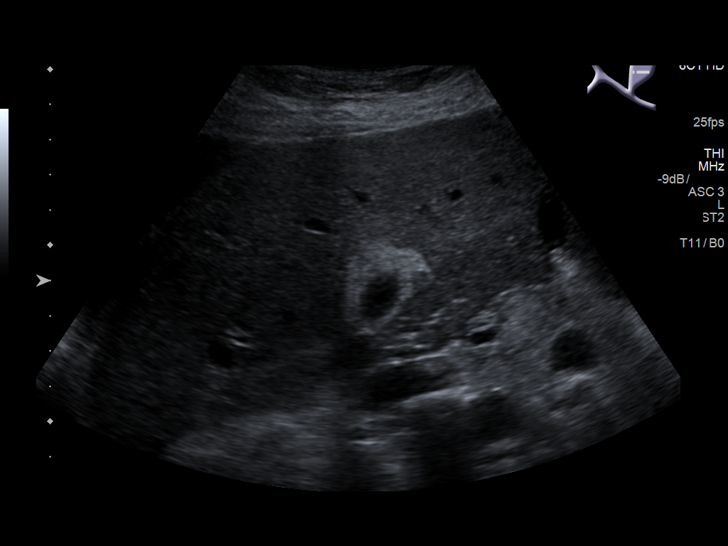
[im 20/115]
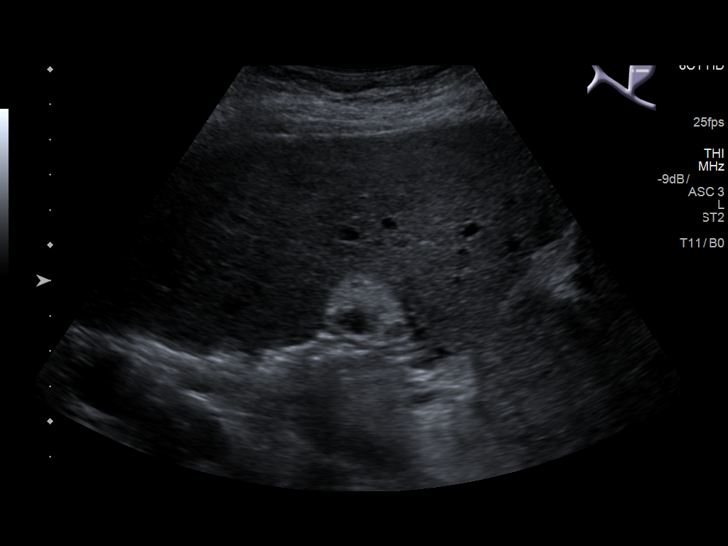
[im 29/115]
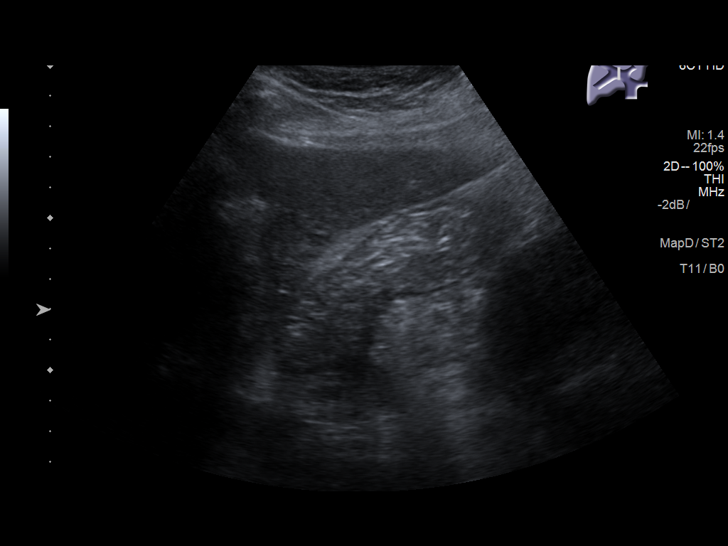
[im 39/115]
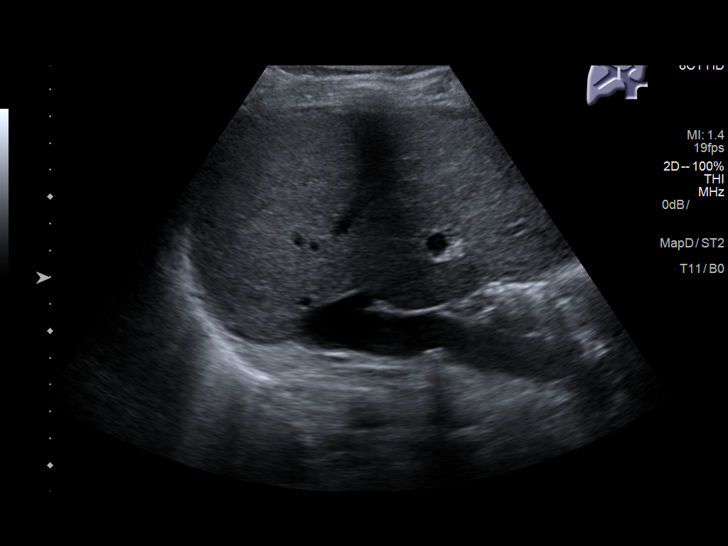
[im 48/115]
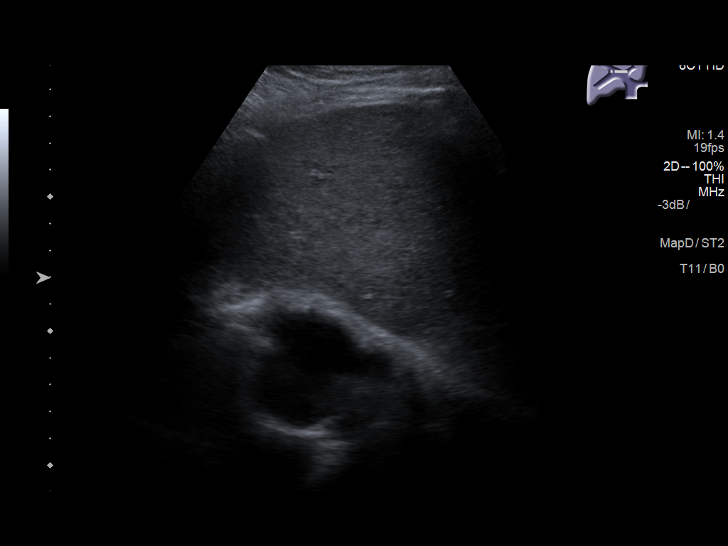
[im 58/115]
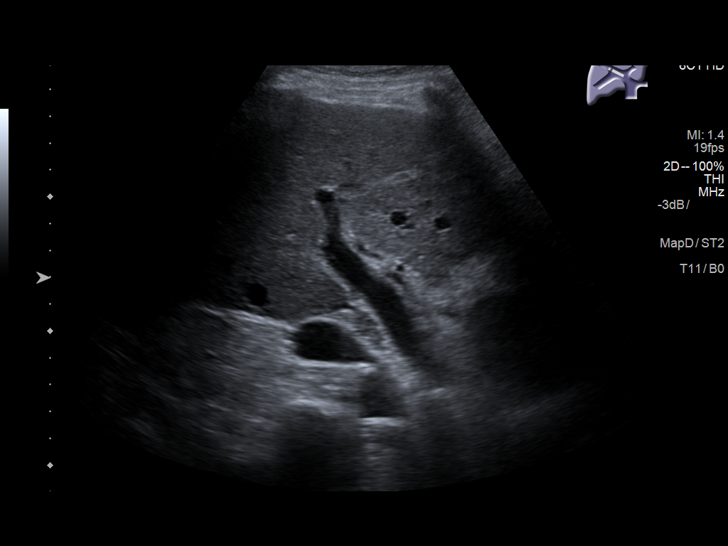
[im 67/115]
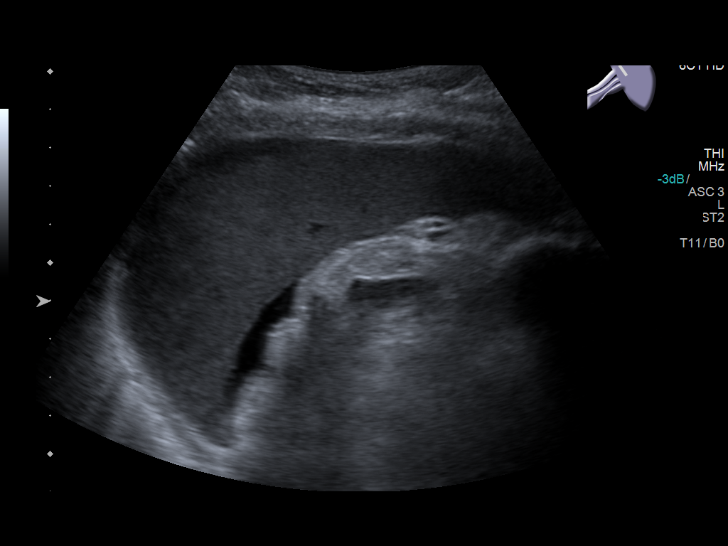
[im 77/115]
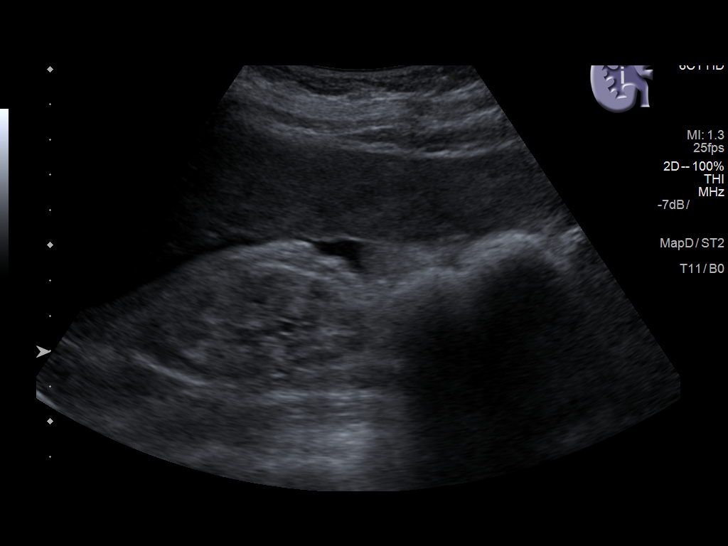
[im 86/115]
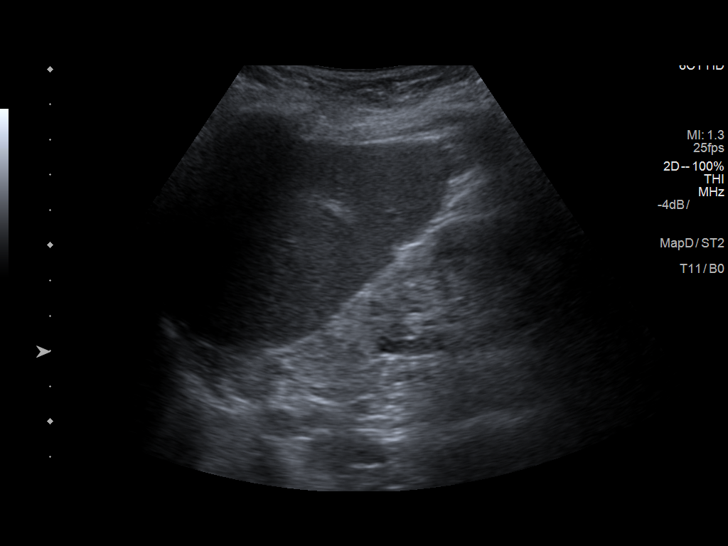
[im 96/115]
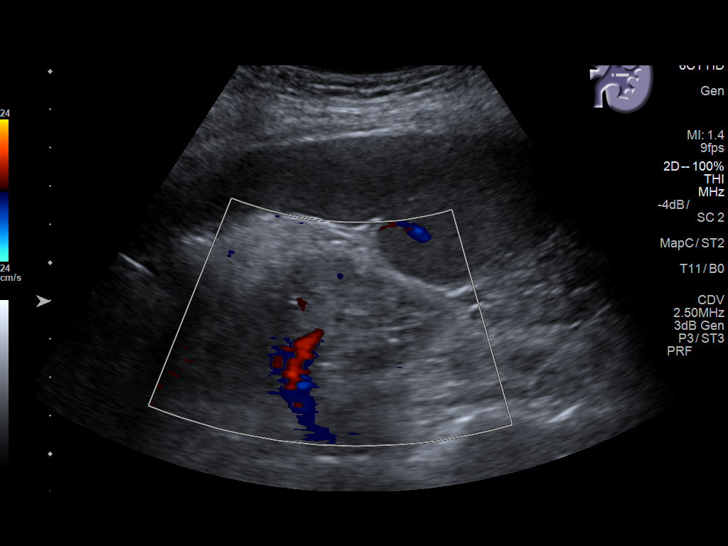
[im 105/115]
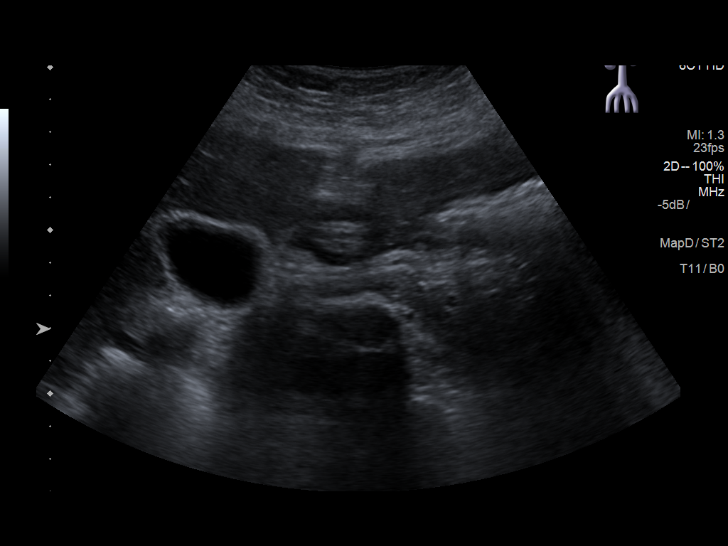
[im 115/115]
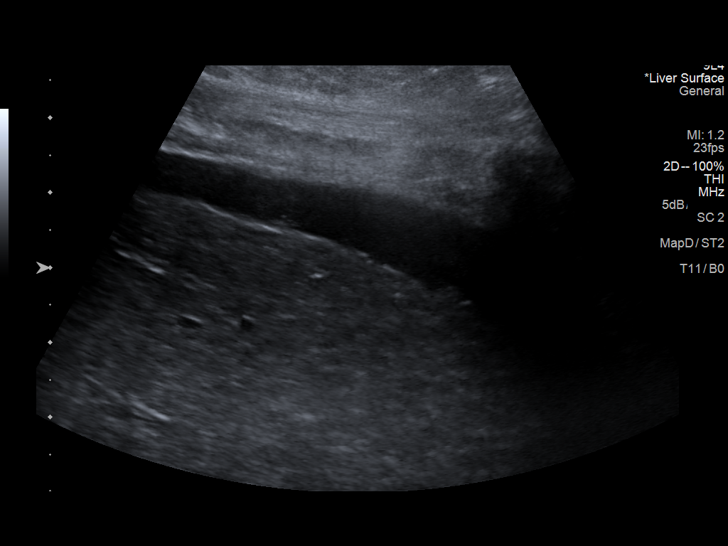

[13 of 25 positions shown; findings below may reference images not displayed]

FINDINGS: Gallbladder: 3 mm polyp is noted. 7 mm gallstone is noted. Diffuse
gallbladder wall thickening is noted measuring 6 mm. No sonographic
Murphy's sign or pericholecystic fluid is noted.

Common bile duct: Diameter: 4 mm which is within normal limits.

Liver: No focal lesion identified. Slightly nodular hepatic contour
is noted with heterogeneous echotexture suggesting possible hepatic
cirrhosis. Portal vein is patent on color Doppler imaging with
normal direction of blood flow towards the liver.

IVC: No abnormality visualized.

Pancreas: Visualized portion unremarkable.

Spleen: Size and appearance within normal limits.

Right Kidney: Length: 6.7 cm. Increased echogenicity is noted. No
mass or hydronephrosis visualized.

Left Kidney: Length: 7.1 cm. Increased echogenicity is noted. 1.4 cm
simple cyst is noted in lower pole. No mass or hydronephrosis
visualized.

Abdominal aorta: Atherosclerosis of abdominal aorta is noted without
aneurysm formation.

Other findings: Minimal amount of ascites is noted around the liver
and spleen.
IMPRESSION: Slightly nodular hepatic contours are noted suggesting possible
hepatic cirrhosis. Minimal ascites is noted.

Solitary gallstone is noted as well small gallbladder polyp. Diffuse
gallbladder wall thickening is noted which may be due to adjacent
hepatocellular disease or surrounding ascites. However, if there is
clinical concern for cholecystitis, HIDA scan is recommended for
further evaluation.

Aortic Atherosclerosis (E953Z-EQE.E).

## 2020-04-04 IMAGING — CR DG CHEST 2V
2 series · 2 of 2 positions shown · non-contrast
Comparison: Radiographs February 25, 2018.

CLINICAL DATA: Chest pain.

EXAM:
CHEST - 2 VIEW

[chest pa]
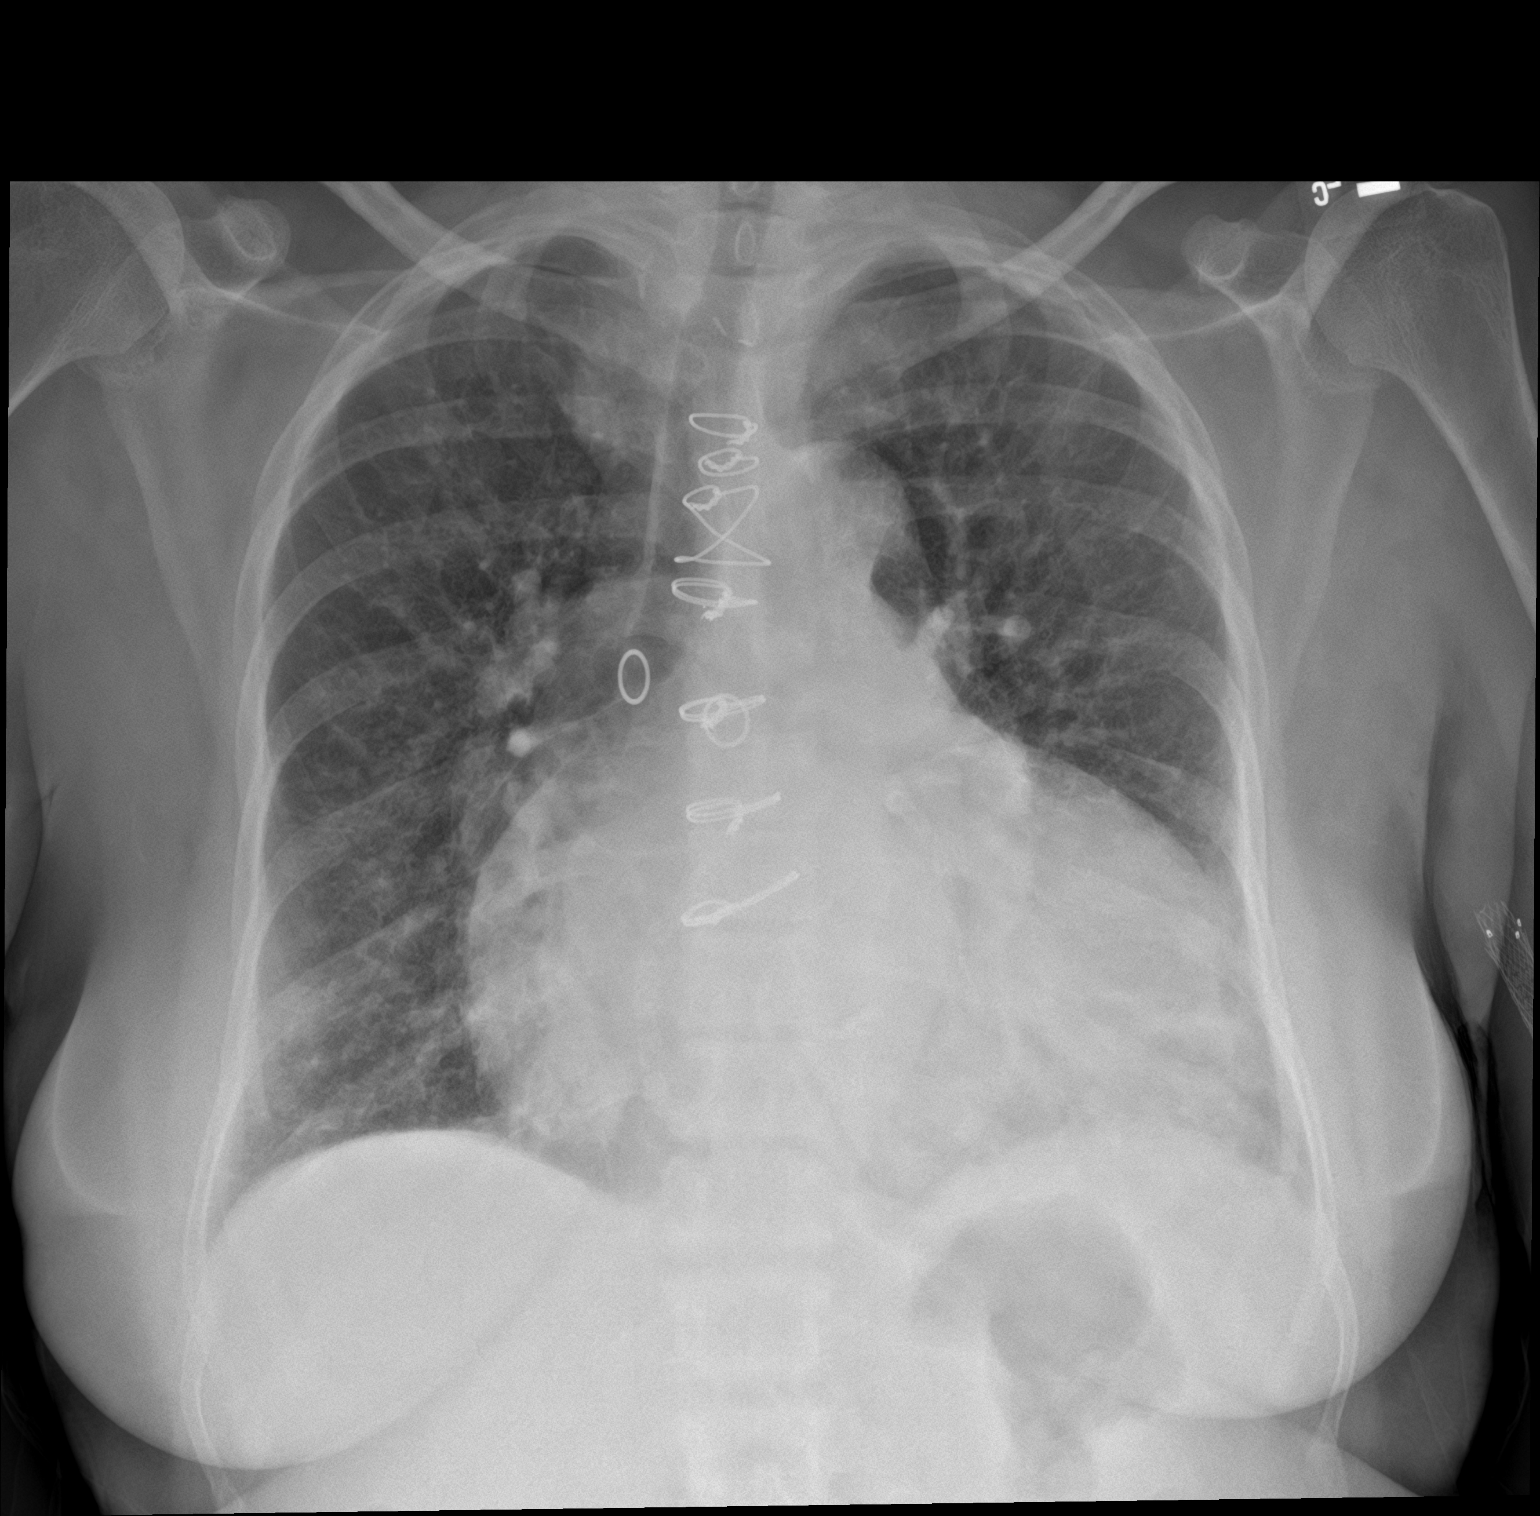

[chest lat]
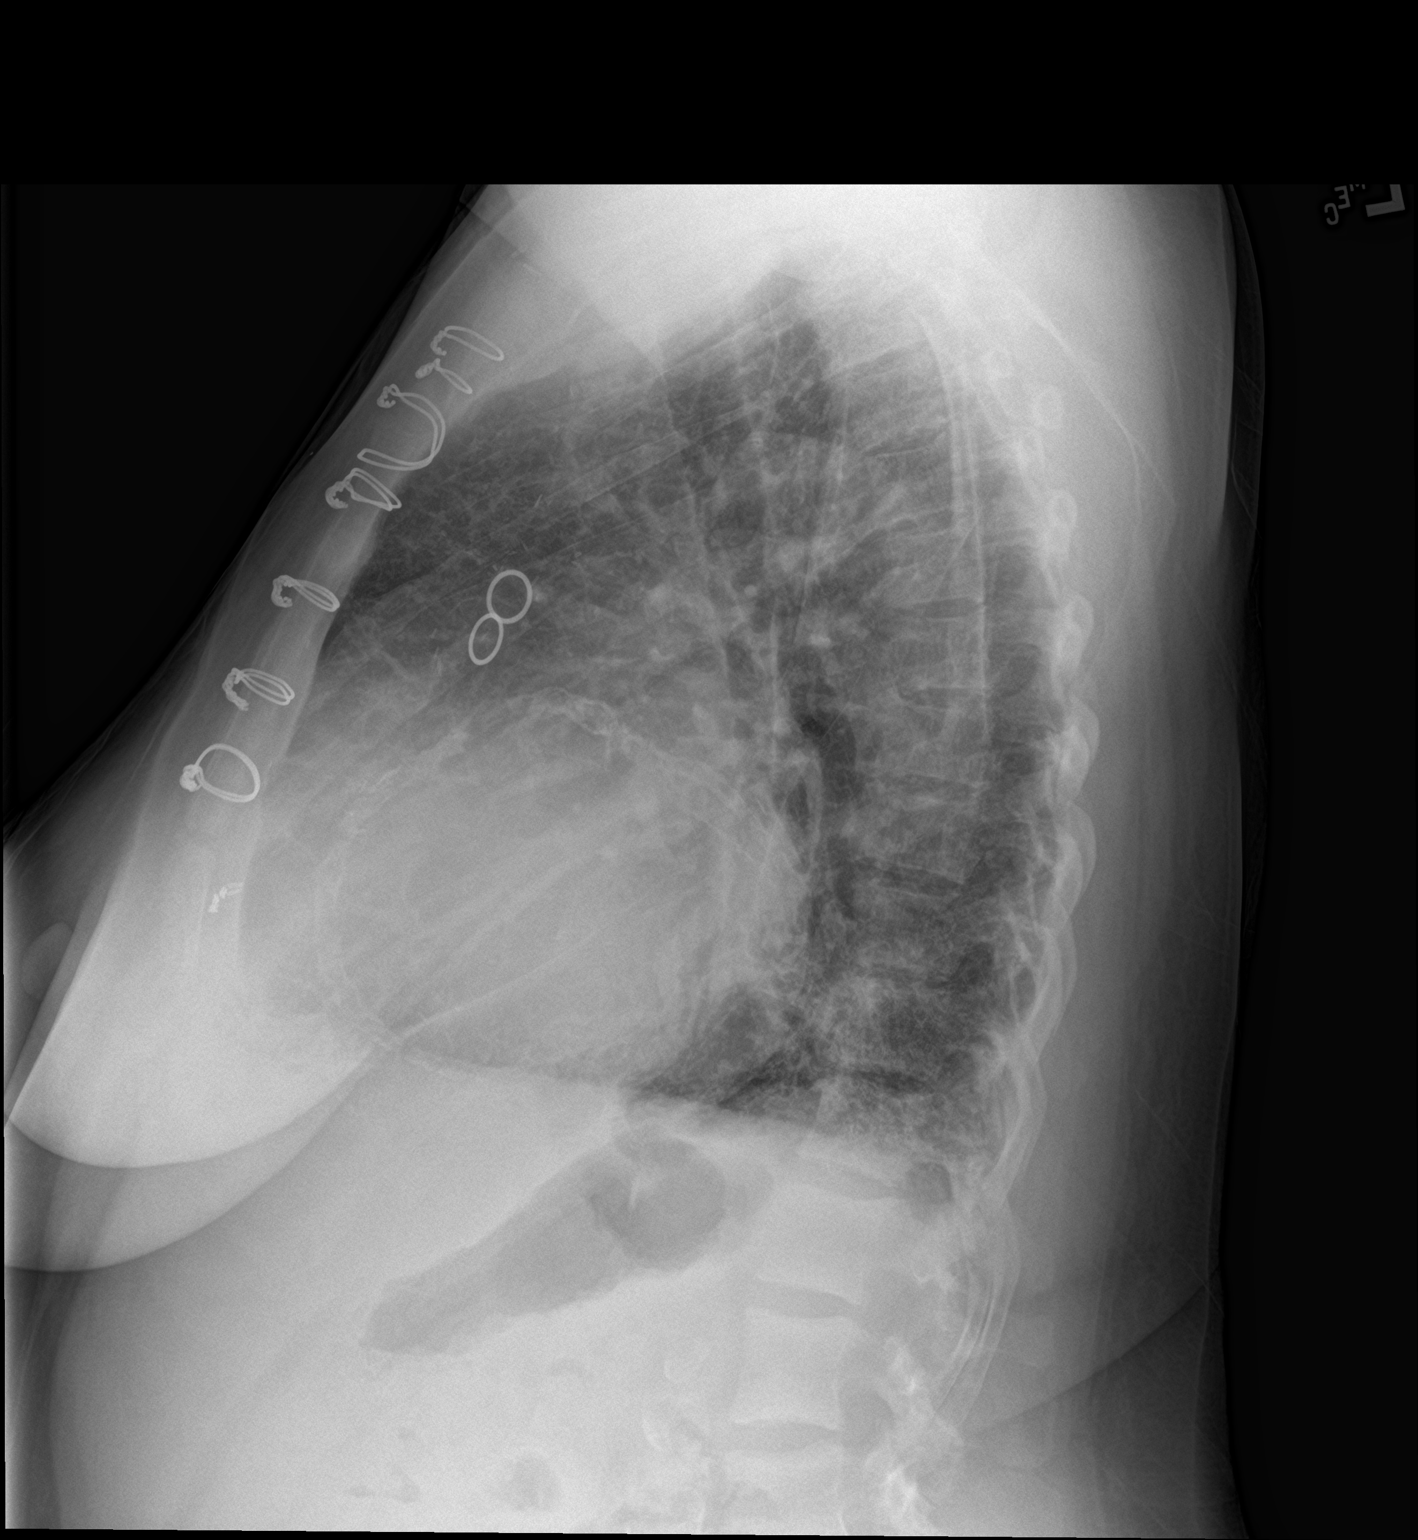

[2 of 2 positions shown; findings below may reference images not displayed]

FINDINGS: Stable cardiomegaly with central pulmonary vascular congestion.
Status post coronary artery bypass graft. No pneumothorax or pleural
effusion is noted. No consolidative process is noted. Bony thorax is
unremarkable.
IMPRESSION: Stable cardiomegaly with central pulmonary vascular congestion.

## 2020-04-17 ENCOUNTER — Other Ambulatory Visit (INDEPENDENT_AMBULATORY_CARE_PROVIDER_SITE_OTHER): Payer: Self-pay | Admitting: Nurse Practitioner

## 2020-04-17 DIAGNOSIS — N186 End stage renal disease: Secondary | ICD-10-CM

## 2020-04-18 ENCOUNTER — Ambulatory Visit (INDEPENDENT_AMBULATORY_CARE_PROVIDER_SITE_OTHER): Payer: Medicare Other

## 2020-04-18 ENCOUNTER — Ambulatory Visit (INDEPENDENT_AMBULATORY_CARE_PROVIDER_SITE_OTHER): Payer: Medicare Other | Admitting: Nurse Practitioner

## 2020-04-18 ENCOUNTER — Other Ambulatory Visit: Payer: Self-pay

## 2020-04-18 ENCOUNTER — Encounter (INDEPENDENT_AMBULATORY_CARE_PROVIDER_SITE_OTHER): Payer: Self-pay | Admitting: Nurse Practitioner

## 2020-04-18 ENCOUNTER — Telehealth (INDEPENDENT_AMBULATORY_CARE_PROVIDER_SITE_OTHER): Payer: Self-pay

## 2020-04-18 VITALS — BP 77/41 | HR 67 | Resp 16 | Wt 151.6 lb

## 2020-04-18 DIAGNOSIS — I959 Hypotension, unspecified: Secondary | ICD-10-CM | POA: Diagnosis not present

## 2020-04-18 DIAGNOSIS — N186 End stage renal disease: Secondary | ICD-10-CM

## 2020-04-18 DIAGNOSIS — E785 Hyperlipidemia, unspecified: Secondary | ICD-10-CM

## 2020-04-18 NOTE — Progress Notes (Signed)
Subjective:    Patient ID: [name], female    DOB: [birth year], 59 y.o.   MRN: [id number] Chief Complaint  Patient presents with  . Follow-up    ultrasound follow up    The patient returns to the office for follow up regarding problem with the dialysis access. Currently the patient is maintained via a left brachial basilic fistula.  The patient notes a significant increase in bleeding time after decannulation.  The patient notes that for the last 2 weeks she has had significant bleeding after dialysis including up to 24 or more hours after dialysis cessation.  The patient has also been informed that there is increased recirculation.    The patient endorses experiencing some hand tingling during dialysis but not profound numbness or pain.  No significant arm swelling.  The patient denies redness or swelling at the access site. The patient denies fever or chills at home or while on dialysis.  The patient denies amaurosis fugax or recent TIA symptoms. There are no recent neurological changes noted. The patient denies claudication symptoms or rest pain symptoms. The patient denies history of DVT, PE or superficial thrombophlebitis. The patient denies recent episodes of angina or shortness of breath.   Today the patient's flow volume is 1430.  This is consistent with the previous studies there are some elevated velocities in the distal upper arm.   Review of Systems  Constitutional: Positive for fatigue.  Neurological: Positive for numbness.  Hematological: Bruises/bleeds easily.  All other systems reviewed and are negative.      Objective:   Physical Exam Vitals reviewed.  HENT:     Head: Normocephalic.  Cardiovascular:     Rate and Rhythm: Normal rate.     Pulses: Normal pulses.  Pulmonary:     Effort: Pulmonary effort is normal.  Neurological:     Mental Status: She is alert and oriented to person, place, and time.  Psychiatric:        Mood and Affect: Mood normal.         Thought Content: Thought content normal.        Judgment: Judgment normal.     BP (!) 77/41 (BP Location: Right Arm)   Pulse 67   Resp 16   Wt 151 lb 9.6 oz (68.8 kg)   BMI 24.47 kg/m   Past Medical History:  Diagnosis Date  . Anemia    chronic disease  . CHF (congestive heart failure) ([place])   . Coronary artery disease   . Heart failure ([place])   . Hepatitis    history of hep c  . Hyperlipidemia   . Hypertension   . Myocardial infarction ([place])   . Paroxysmal atrial fibrillation ([facility])   . Renal failure   . Renal insufficiency   . Stroke [facility]) 2011    Social History   Socioeconomic History  . Marital status: Single    Spouse name: Not on file  . Number of children: Not on file  . Years of education: Not on file  . Highest education level: Not on file  Occupational History  . Not on file  Tobacco Use  . Smoking status: Current Some Day Smoker    Packs/day: 0.10    Years: 20.00    Pack years: 2.00    Types: Cigarettes  . Smokeless tobacco: Never Used  . Tobacco comment: smokes one a day  Vaping Use  . Vaping Use: Never used  Substance and Sexual Activity  . Alcohol  use: No  . Drug use: No  . Sexual activity: Not Currently    Birth control/protection: Post-menopausal  Other Topics Concern  . Not on file  Social History Narrative  . Not on file   Social Determinants of Health   Financial Resource Strain:   . Difficulty of Paying Living Expenses: Not on file  Food Insecurity:   . Worried About Charity fundraiser in the Last Year: Not on file  . Ran Out of Food in the Last Year: Not on file  Transportation Needs:   . Lack of Transportation (Medical): Not on file  . Lack of Transportation (Non-Medical): Not on file  Physical Activity:   . Days of Exercise per Week: Not on file  . Minutes of Exercise per Session: Not on file  Stress:   . Feeling of Stress : Not on file  Social Connections:   . Frequency of Communication with Friends and Family:  Not on file  . Frequency of Social Gatherings with Friends and Family: Not on file  . Attends Religious Services: Not on file  . Active Member of Clubs or Organizations: Not on file  . Attends Archivist Meetings: Not on file  . Marital Status: Not on file  Intimate Partner Violence:   . Fear of Current or Ex-Partner: Not on file  . Emotionally Abused: Not on file  . Physically Abused: Not on file  . Sexually Abused: Not on file    Past Surgical History:  Procedure Laterality Date  . A/V FISTULAGRAM Left 03/30/2019   Procedure: A/V FISTULAGRAM;  Surgeon: Algernon Huxley, MD;  Location: [place] CV LAB;  Service: Cardiovascular;  Laterality: Left;  . A/V FISTULAGRAM Left 10/06/2019   Procedure: A/V FISTULAGRAM;  Surgeon: Algernon Huxley, MD;  Location: [place] [facility];  Service: Cardiovascular;  Laterality: Left;  . CORONARY ANGIOPLASTY WITH STENT PLACEMENT  2013  . CORONARY ARTERY BYPASS GRAFT    . DIALYSIS FISTULA CREATION    . FLEXIBLE SIGMOIDOSCOPY N/A 02/23/2019   Procedure: FLEXIBLE SIGMOIDOSCOPY;  Surgeon: Lollie Sails, MD;  Location: [facility] ENDOSCOPY;  Service: Endoscopy;  Laterality: N/A;  . LEFT HEART CATH AND CORS/GRAFTS ANGIOGRAPHY N/A 08/08/2018   Procedure: LEFT HEART CATH AND CORS/GRAFTS ANGIOGRAPHY;  Surgeon: Dionisio David, MD;  Location: [place] [facility];  Service: Cardiovascular;  Laterality: N/A;  . LEFT HEART CATH AND CORS/GRAFTS ANGIOGRAPHY N/A 01/30/2019   Procedure: LEFT HEART CATH AND CORS/GRAFTS ANGIOGRAPHY;  Surgeon: Dionisio David, MD;  Location: [place] [facility] LAB;  Service: Cardiovascular;  Laterality: N/A;    Family History  Problem Relation Age of Onset  . Hypertension Other   . Cancer Other   . Renal Disease Other   . Ovarian cancer Mother   . Kidney disease Father   . Breast cancer Maternal Aunt   . Breast cancer Maternal Grandmother     Allergies  Allergen Reactions  . Morphine And Related Hives  . Levaquin  [Levofloxacin] Itching    Severe itching; prickly sensation  . Other   . Latex Rash  . Tape Rash    CBC Latest Ref Rng & Units 02/06/2020 02/23/2019 01/31/2019  WBC 4.0 - 10.5 K/uL 5.0 - 3.1(L)  Hemoglobin 12.0 - 15.0 g/dL 10.3(L) 13.3 10.5(L)  Hematocrit 36 - 46 % 31.8(L) 39.0 32.3(L)  Platelets 150 - 400 K/uL 114(L) - 156      CMP     Component Value Date/Time   NA 137  02/06/2020 0708   NA 139 07/09/2014 0503   K 4.6 02/06/2020 0708   K 4.7 07/11/2014 0958   CL 98 02/06/2020 0708   CL 98 07/09/2014 0503   CO2 24 02/06/2020 0708   CO2 30 07/09/2014 0503   GLUCOSE 89 02/06/2020 0708   GLUCOSE 107 (H) 07/09/2014 0503   BUN 34 (H) 02/06/2020 0708   BUN 39 (H) 07/09/2014 0503   CREATININE 7.55 (H) 02/06/2020 0708   CREATININE 5.53 (H) 07/09/2014 0503   CALCIUM 8.9 02/06/2020 0708   CALCIUM 8.3 (L) 07/09/2014 0503   PROT 7.4 02/06/2020 0708   PROT 8.4 (H) 07/07/2014 2207   ALBUMIN 3.9 02/06/2020 0708   ALBUMIN 3.1 (L) 07/07/2014 2207   AST 17 02/06/2020 0708   AST 76 (H) 07/07/2014 2207   ALT 11 02/06/2020 0708   ALT 41 07/07/2014 2207   ALKPHOS 181 (H) 02/06/2020 0708   ALKPHOS 107 07/07/2014 2207   BILITOT 0.7 02/06/2020 0708   BILITOT 0.7 07/07/2014 2207   GFRNONAA 5 (L) 02/06/2020 0708   GFRNONAA 9 (L) 07/09/2014 0503   GFRNONAA 9 (L) 02/11/2014 0300   GFRAA 6 (L) 02/06/2020 0708   GFRAA 10 (L) 07/09/2014 0503   GFRAA 10 (L) 02/11/2014 0300         Assessment & Plan:   1. ESRD (end stage renal disease) ([place]) Recommend:  The patient is experiencing increasing problems with their dialysis access.  Patient should have a fistulagram with the intention for intervention.  The intention for intervention is to restore appropriate flow and prevent thrombosis and possible loss of the access.  As well as improve the quality of dialysis therapy.  The risks, benefits and alternative therapies were reviewed in detail with the patient.  All questions were answered.   The patient agrees to proceed with angio/intervention.      2. Hypotension, unspecified hypotension type The patient was hypotensive during today's office visit.  She did endorse feeling a little more tired than usual however mostly asymptomatic.  Patient is advised to return home and to maintain hydration.  However if the patient's weakness worsens she should seek urgent attention at the emergency room.  3. Hyperlipidemia, unspecified hyperlipidemia type Continue statin as ordered and reviewed, no changes at this time    Current Outpatient Medications on File Prior to Visit  Medication Sig Dispense Refill  . amiodarone (PACERONE) 200 MG tablet Take 200 mg by mouth 2 (two) times daily.    Marland Kitchen aspirin 81 MG chewable tablet Chew 81 mg by mouth daily.    . carvedilol (COREG) 12.5 MG tablet Take 12.5 mg by mouth 2 (two) times daily with a meal.    . cinacalcet (SENSIPAR) 60 MG tablet Take 60 mg by mouth every evening.     Marland Kitchen ELIQUIS 5 MG TABS tablet Take 1 tablet (5 mg total) by mouth 2 (two) times daily. 60 tablet 0  . hydrOXYzine (ATARAX/VISTARIL) 50 MG tablet Take 50 mg by mouth 2 (two) times daily as needed for itching.     . isosorbide mononitrate (IMDUR) 60 MG 24 hr tablet Take 60 mg by mouth daily.     Marland Kitchen levothyroxine (SYNTHROID) 75 MCG tablet Take 75 mcg by mouth daily before breakfast.     . lidocaine-prilocaine (EMLA) cream APPLY TO ACCESS SITE 30 MINUTES PRIOR TO DIALYSIS    . midodrine (PROAMATINE) 10 MG tablet Take 10-20 mg by mouth daily. (for low BP associated with dialysis)    .  oxyCODONE-acetaminophen (PERCOCET/ROXICET) 5-325 MG tablet Take 1 tablet by mouth 2 (two) times daily as needed for moderate pain.     . pantoprazole (PROTONIX) 40 MG tablet Take 40 mg by mouth daily.    . ranolazine (RANEXA) 500 MG 12 hr tablet Take 500 mg by mouth 2 (two) times daily.    . rosuvastatin (CRESTOR) 20 MG tablet Take 20 mg by mouth at bedtime.    . sacubitril-valsartan (ENTRESTO) 49-51 MG  Take 1 tablet by mouth 2 (two) times daily.     . sevelamer carbonate (RENVELA) 800 MG tablet Take 2,400 mg by mouth 3 (three) times daily with meals. And 1 tablet with snacks    . sucralfate (CARAFATE) 1 g tablet Take 1 g by mouth 2 (two) times daily.    . diclofenac sodium (VOLTAREN) 1 % GEL Apply 4 g topically 4 (four) times daily. (Patient not taking: Reported on 03/30/2019) 100 g 3   Current Facility-Administered Medications on File Prior to Visit  Medication Dose Route Frequency Provider Last Rate Last Admin  . sodium chloride flush (NS) 0.9 % injection 3 mL  3 mL Intravenous Q12H Neoma Laming A, MD   350 mL at 10/06/19 1212    There are no Patient Instructions on file for this visit. No follow-ups on file.   Kris Hartmann, NP

## 2020-04-18 NOTE — Telephone Encounter (Signed)
Spoke with the patient, she is scheduled with Dr. Lucky Cowboy for a LUE fistulagram on 04/25/20 with a 1:30 pm arrival time to the [facility]. Covid [facility] Pre-procedure instructions were discussed and will be mailed.

## 2020-04-22 NOTE — Telephone Encounter (Signed)
I attempted to contact the patient to reschedule her fistulagram that is scheduled with Dr. Lucky Cowboy on 04/25/20. A message was left for a return call.

## 2020-04-24 ENCOUNTER — Other Ambulatory Visit (INDEPENDENT_AMBULATORY_CARE_PROVIDER_SITE_OTHER): Payer: Self-pay | Admitting: Nurse Practitioner

## 2020-04-25 DIAGNOSIS — N186 End stage renal disease: Secondary | ICD-10-CM

## 2020-04-30 ENCOUNTER — Other Ambulatory Visit: Payer: Self-pay

## 2020-04-30 ENCOUNTER — Other Ambulatory Visit
Admission: RE | Admit: 2020-04-30 | Discharge: 2020-04-30 | Disposition: A | Discharge status: Home / Self Care | Payer: Medicare Other | Source: Ambulatory Visit | Attending: Vascular Surgery | Admitting: Vascular Surgery

## 2020-04-30 DIAGNOSIS — Z20822 Contact with and (suspected) exposure to covid-19: Secondary | ICD-10-CM | POA: Diagnosis not present

## 2020-04-30 DIAGNOSIS — Z01812 Encounter for preprocedural laboratory examination: Secondary | ICD-10-CM | POA: Diagnosis present

## 2020-04-30 LAB — SARS CORONAVIRUS 2 (TAT 6-24 HRS): SARS Coronavirus 2: NEGATIVE

## 2020-05-02 ENCOUNTER — Ambulatory Visit: Payer: Medicare Other | Admitting: Certified Registered Nurse Anesthetist

## 2020-05-02 ENCOUNTER — Encounter
Admission: RE | Disposition: A | Discharge status: Home / Self Care | Payer: Self-pay | Source: Home / Self Care | Attending: Vascular Surgery

## 2020-05-02 ENCOUNTER — Ambulatory Visit
Admission: RE | Admit: 2020-05-02 | Discharge: 2020-05-02 | Disposition: A | Discharge status: Home / Self Care | Payer: Medicare Other | Attending: Vascular Surgery | Admitting: Vascular Surgery

## 2020-05-02 ENCOUNTER — Other Ambulatory Visit: Payer: Self-pay

## 2020-05-02 ENCOUNTER — Encounter: Payer: Self-pay | Admitting: Vascular Surgery

## 2020-05-02 DIAGNOSIS — N186 End stage renal disease: Secondary | ICD-10-CM | POA: Diagnosis not present

## 2020-05-02 DIAGNOSIS — Z539 Procedure and treatment not carried out, unspecified reason: Secondary | ICD-10-CM | POA: Diagnosis present

## 2020-05-02 LAB — POTASSIUM (ARMC VASCULAR LAB ONLY): Potassium (ARMC vascular lab): 6 — ABNORMAL HIGH (ref 3.5–5.1)

## 2020-05-02 LAB — POTASSIUM: Potassium: 5.7 mmol/L — ABNORMAL HIGH (ref 3.5–5.1)

## 2020-05-02 SURGERY — A/V FISTULAGRAM
Anesthesia: General | Laterality: Left

## 2020-05-02 MED ORDER — METHYLPREDNISOLONE SODIUM SUCC 125 MG IJ SOLR: 125.0000 mg | Freq: Once | INTRAMUSCULAR | Status: DC | PRN

## 2020-05-02 MED ORDER — FAMOTIDINE 20 MG PO TABS: 40.0000 mg | ORAL_TABLET | Freq: Once | ORAL | Status: DC | PRN

## 2020-05-02 MED ORDER — MIDAZOLAM HCL 2 MG/ML PO SYRP: 8.0000 mg | ORAL_SOLUTION | Freq: Once | ORAL | Status: DC | PRN

## 2020-05-02 MED ORDER — CEFAZOLIN SODIUM-DEXTROSE 1-4 GM/50ML-% IV SOLN: 1.0000 g | Freq: Once | INTRAVENOUS | Status: DC

## 2020-05-02 MED ORDER — DIPHENHYDRAMINE HCL 50 MG/ML IJ SOLN: 50.0000 mg | Freq: Once | INTRAMUSCULAR | Status: DC | PRN

## 2020-05-02 MED ORDER — FENTANYL CITRATE (PF) 100 MCG/2ML IJ SOLN: 12.5000 ug | Freq: Once | INTRAMUSCULAR | Status: DC | PRN

## 2020-05-02 MED ORDER — MIDAZOLAM HCL 2 MG/2ML IJ SOLN
INTRAMUSCULAR | Status: AC
  Filled 2020-05-02: qty 2

## 2020-05-02 MED ORDER — CEFAZOLIN SODIUM-DEXTROSE 1-4 GM/50ML-% IV SOLN
INTRAVENOUS | Status: AC
  Filled 2020-05-02: qty 50

## 2020-05-02 MED ORDER — FENTANYL CITRATE (PF) 100 MCG/2ML IJ SOLN
INTRAMUSCULAR | Status: AC
  Filled 2020-05-02: qty 2

## 2020-05-02 MED ORDER — SODIUM CHLORIDE 0.9 % IV SOLN: INTRAVENOUS | Status: DC

## 2020-05-02 MED ORDER — ONDANSETRON HCL 4 MG/2ML IJ SOLN: 4.0000 mg | Freq: Four times a day (QID) | INTRAMUSCULAR | Status: DC | PRN

## 2020-05-02 NOTE — Telephone Encounter (Signed)
Spoke with Elta Guadeloupe at [facility] and the patient has been rescheduled for a LUE fistulagram with Dr. Lucky Cowboy on 05/03/20 patient will arrive to the [facility] at 12:45 pm. This information was given to [facility] at [facility].

## 2020-05-02 NOTE — Telephone Encounter (Signed)
[name] called to be canceled. The [name]'s angio procedure with Dr. Lucky Cowboy has been canceled and anesthesia was called as well. [name] wanted to be rescheduled after the holiday.

## 2020-05-02 NOTE — Anesthesia Preprocedure Evaluation (Deleted)
Anesthesia Evaluation  Patient identified by MRN, date of birth, ID band Patient awake    Reviewed: Allergy & Precautions, H&P , NPO status , reviewed documented beta blocker date and time   Airway Mallampati: III  TM Distance: >3 FB Neck ROM: full    Dental  (+) Chipped   Pulmonary pneumonia, resolved, Current Smoker and Patient abstained from smoking.,    Pulmonary exam normal        Cardiovascular hypertension, + angina + CAD, + Past MI and +CHF  Normal cardiovascular exam  Patent CABG, EF 28% in 2020   Neuro/Psych PSYCHIATRIC DISORDERS Dementia CVA, No Residual Symptoms    GI/Hepatic neg GERD  ,(+) Hepatitis -  Endo/Other    Renal/GU ESRFRenal disease     Musculoskeletal   Abdominal   Peds  Hematology  (+) Blood dyscrasia, anemia ,   Anesthesia Other Findings Past Medical History: No date: Anemia     Comment:  chronic disease No date: CHF (congestive heart failure) (HCC) No date: Coronary artery disease No date: Heart failure (HCC) No date: Hepatitis     Comment:  history of hep c No date: Hyperlipidemia No date: Hypertension No date: Myocardial infarction ([place]) No date: Paroxysmal atrial fibrillation ([place]) No date: Renal failure No date: Renal insufficiency 2011: Stroke [facility])  Past Surgical History: 03/30/2019: A/V FISTULAGRAM; Left     Comment:  Procedure: A/V FISTULAGRAM;  Surgeon: Algernon Huxley, MD;               Location: [place] CV LAB;  Service: Cardiovascular;              Laterality: Left; 10/06/2019: A/V FISTULAGRAM; Left     Comment:  Procedure: A/V FISTULAGRAM;  Surgeon: Algernon Huxley, MD;               Location: [place] CV LAB;  Service: Cardiovascular;              Laterality: Left; 2013: CORONARY ANGIOPLASTY WITH STENT PLACEMENT No date: CORONARY ARTERY BYPASS GRAFT No date: DIALYSIS FISTULA CREATION 02/23/2019: FLEXIBLE SIGMOIDOSCOPY; N/A     Comment:  Procedure: FLEXIBLE  SIGMOIDOSCOPY;  Surgeon: Lollie Sails, MD;  Location: [facility] ENDOSCOPY;  Service:               Endoscopy;  Laterality: N/A; 08/08/2018: LEFT HEART CATH AND CORS/GRAFTS ANGIOGRAPHY; N/A     Comment:  Procedure: LEFT HEART CATH AND CORS/GRAFTS ANGIOGRAPHY;               Surgeon: Dionisio David, MD;  Location: [place] CV              LAB;  Service: Cardiovascular;  Laterality: N/A; 01/30/2019: LEFT HEART CATH AND CORS/GRAFTS ANGIOGRAPHY; N/A     Comment:  Procedure: LEFT HEART CATH AND CORS/GRAFTS ANGIOGRAPHY;               Surgeon: Dionisio David, MD;  Location: [place] CV              LAB;  Service: Cardiovascular;  Laterality: N/A;     Reproductive/Obstetrics                            Anesthesia Physical Anesthesia Plan  ASA: IV  Anesthesia Plan: General   Post-op Pain Management:    Induction: Intravenous  PONV  Risk Score and Plan: Treatment may vary due to age or medical condition, TIVA, Ondansetron and Midazolam  Airway Management Planned: Nasal Cannula and Natural Airway  Additional Equipment:   Intra-op Plan:   Post-operative Plan:   Informed Consent: I have reviewed the patients History and Physical, chart, labs and discussed the procedure including the risks, benefits and alternatives for the proposed anesthesia with the patient or authorized representative who has indicated his/her understanding and acceptance.     Dental Advisory Given  Plan Discussed with: CRNA  Anesthesia Plan Comments: (Back up LMA if needed.  Case cancelled 11/18 due to K=5.7, non-emergent case. Discussed w Dr. Lucky Cowboy)      Anesthesia Quick Evaluation

## 2020-05-03 ENCOUNTER — Encounter: Admission: RE | Payer: Self-pay | Source: Home / Self Care

## 2020-05-03 ENCOUNTER — Ambulatory Visit: Admission: RE | Admit: 2020-05-03 | Payer: Medicare Other | Source: Home / Self Care | Admitting: Vascular Surgery

## 2020-05-03 SURGERY — A/V FISTULAGRAM
Anesthesia: General | Laterality: Left

## 2020-05-11 IMAGING — CR DG CHEST 2V
2 series · 2 of 2 positions shown · non-contrast
Comparison: April 21, 2018

CLINICAL DATA: Chest pain.  Fall yesterday.

EXAM:
CHEST - 2 VIEW

[chest lat]
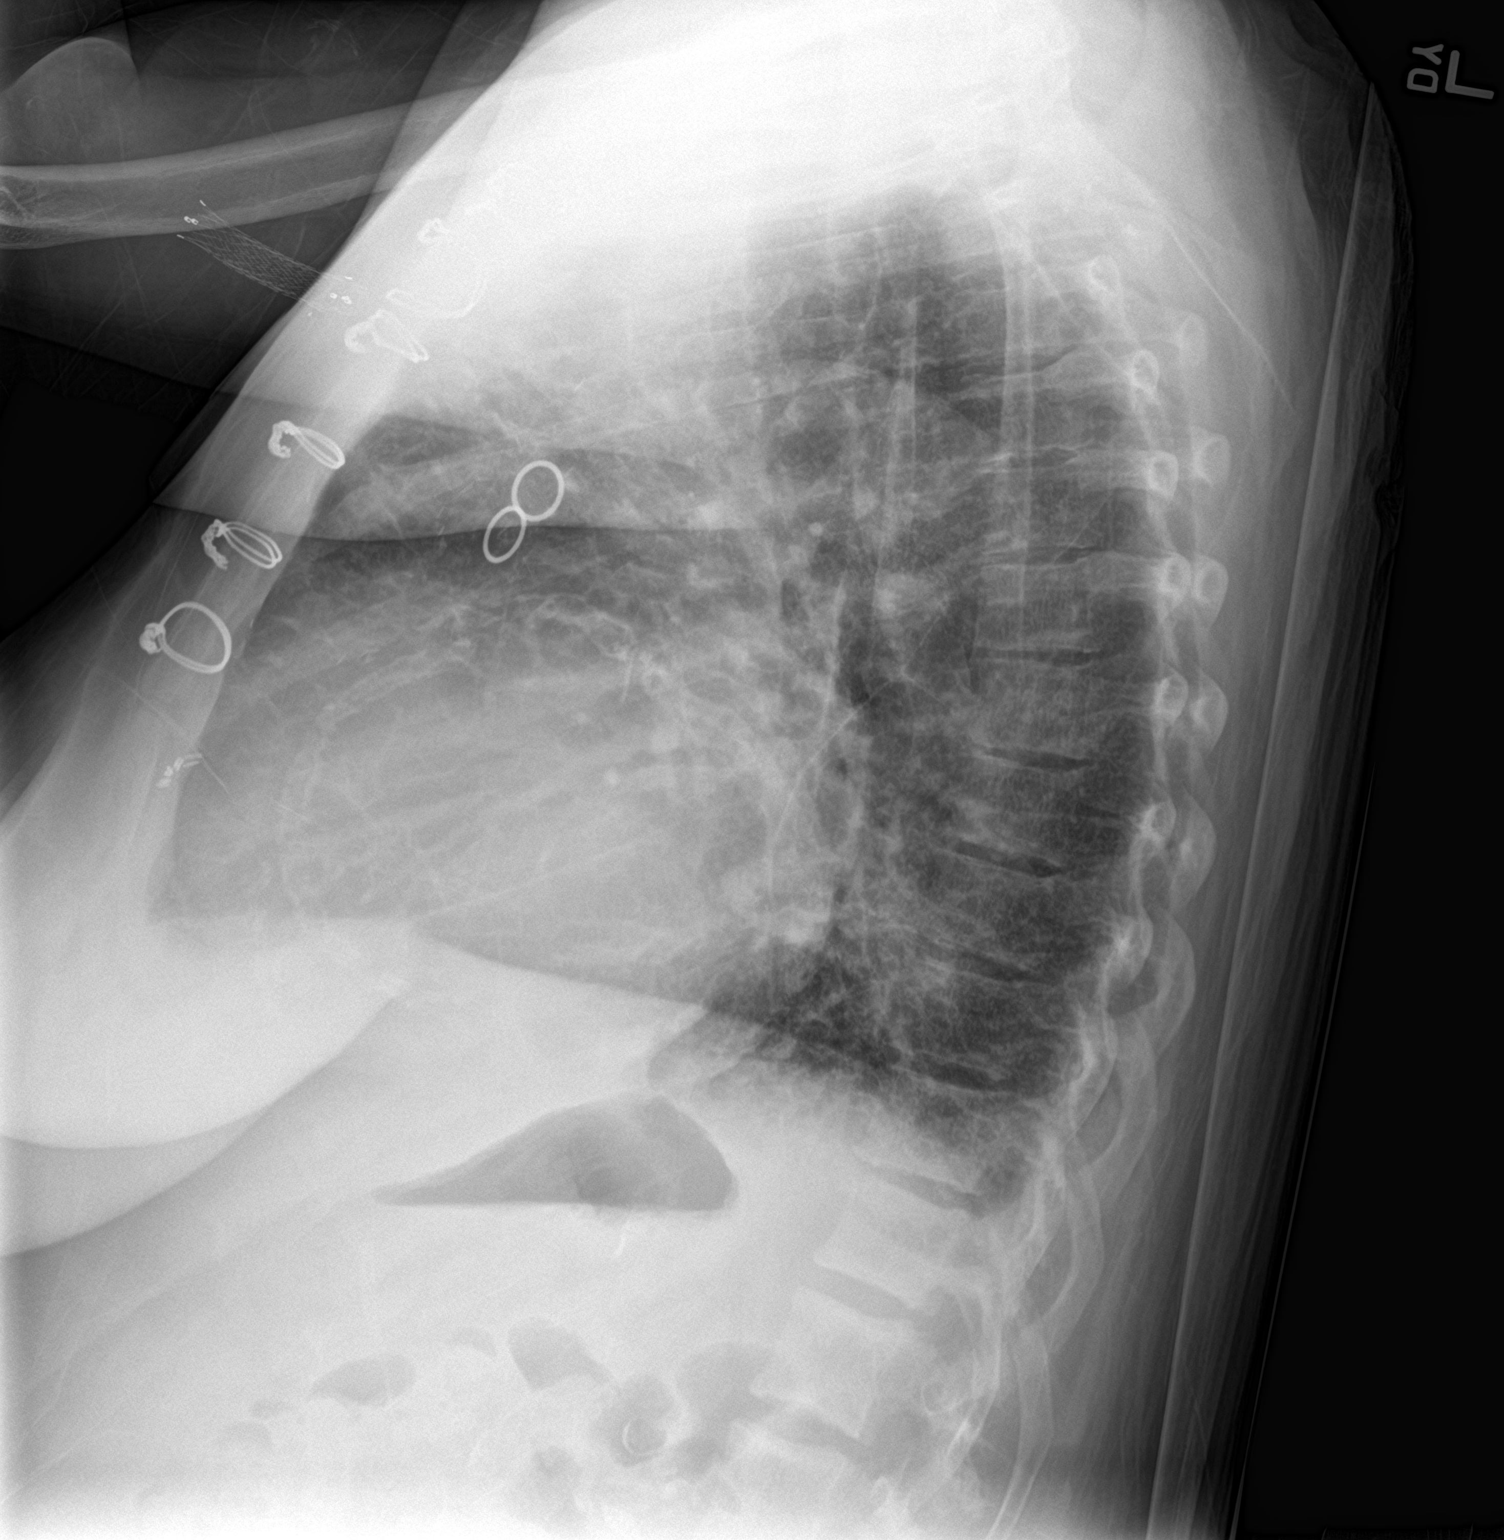

[chest ap]
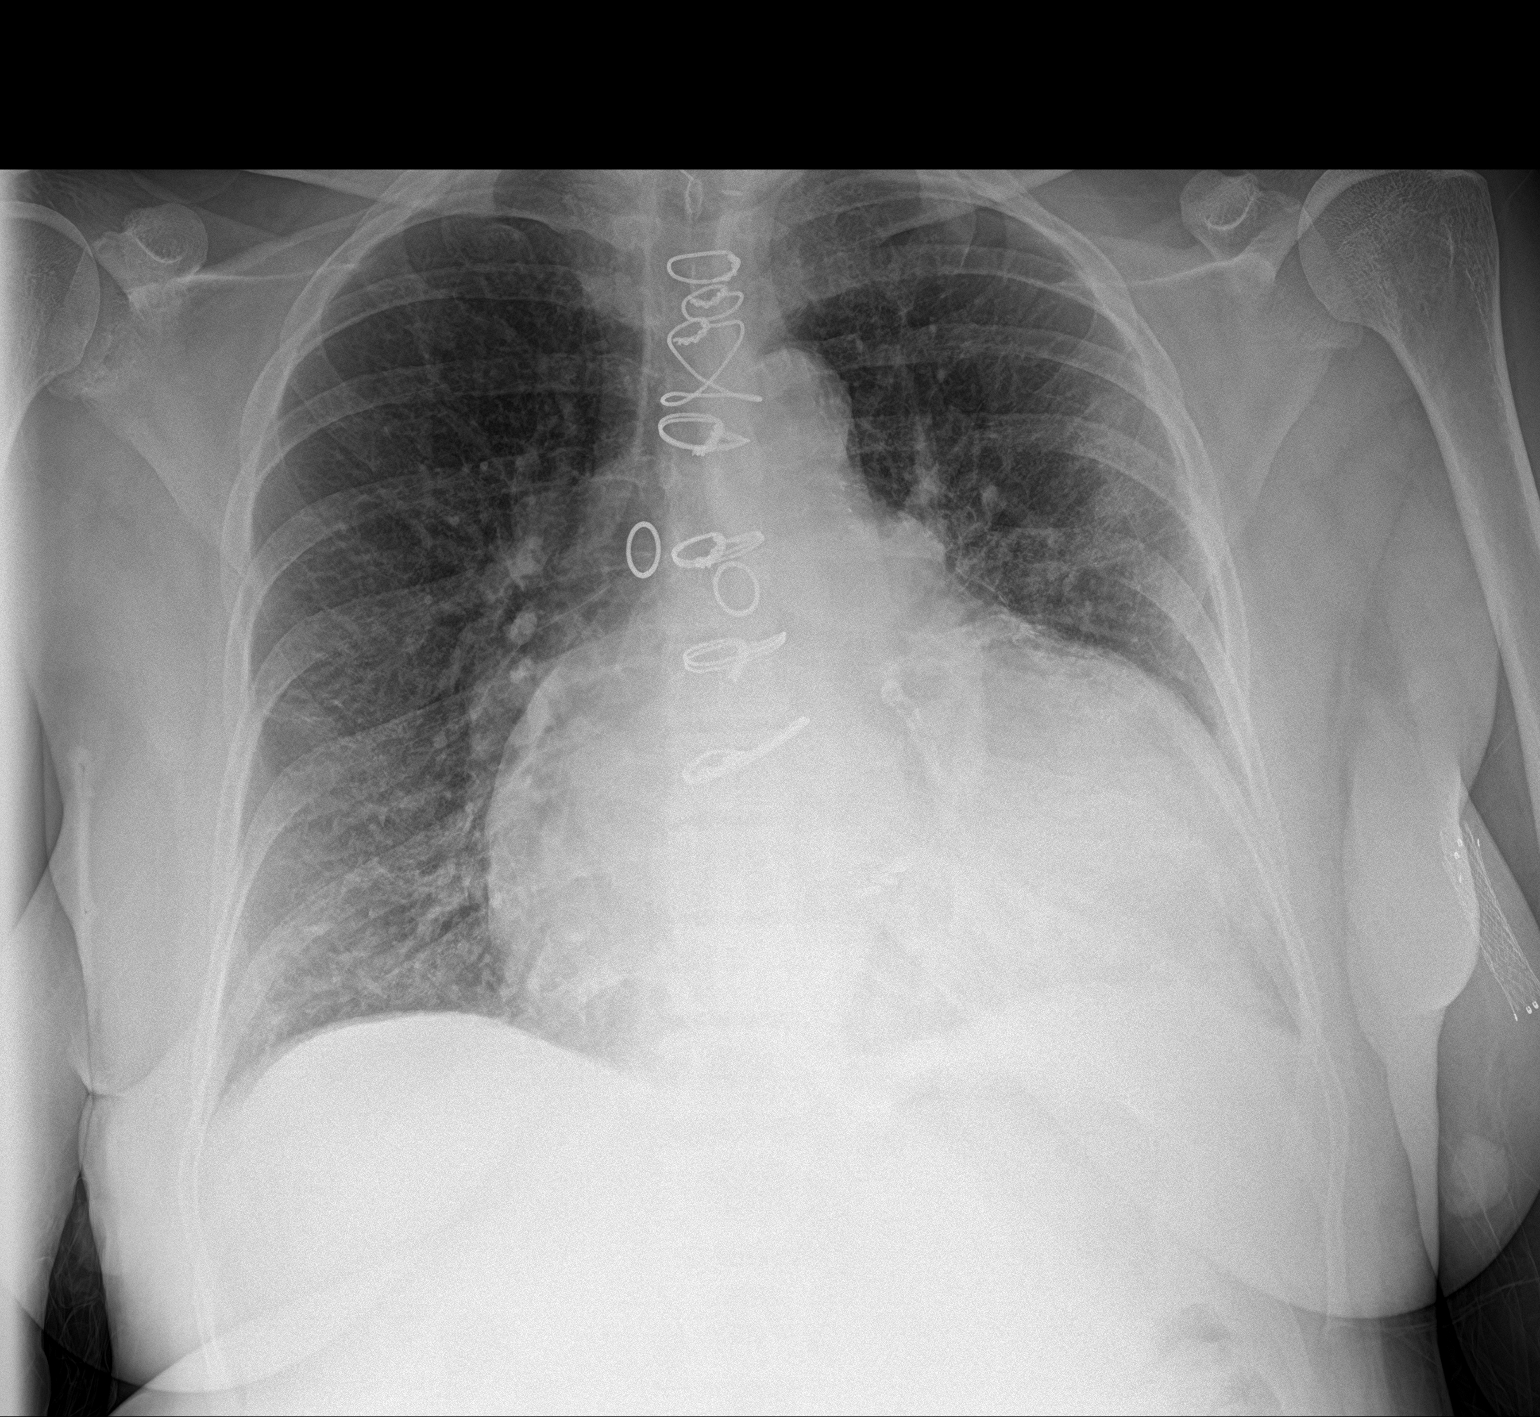

[2 of 2 positions shown; findings below may reference images not displayed]

FINDINGS: Stable cardiomegaly. The hila and mediastinum are normal. No
pulmonary nodules or masses. No focal infiltrates. No other acute
abnormalities.
IMPRESSION: No active cardiopulmonary disease.

## 2020-05-11 IMAGING — CR DG KNEE COMPLETE 4+V*R*
4 series · 4 of 4 positions shown · non-contrast
Comparison: None.

CLINICAL DATA: Pain after fall

EXAM:
RIGHT KNEE - COMPLETE 4+ VIEW

[knee lat]
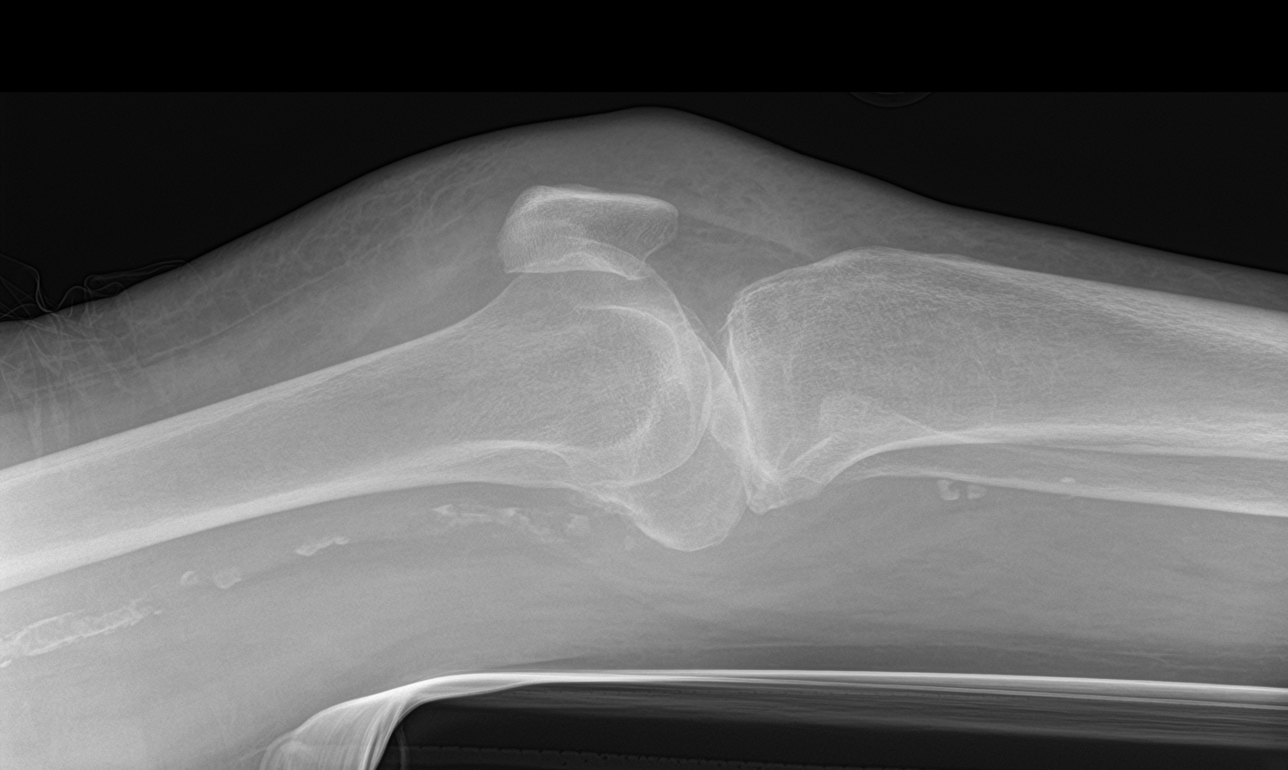

[knee obl (1 of 2)]
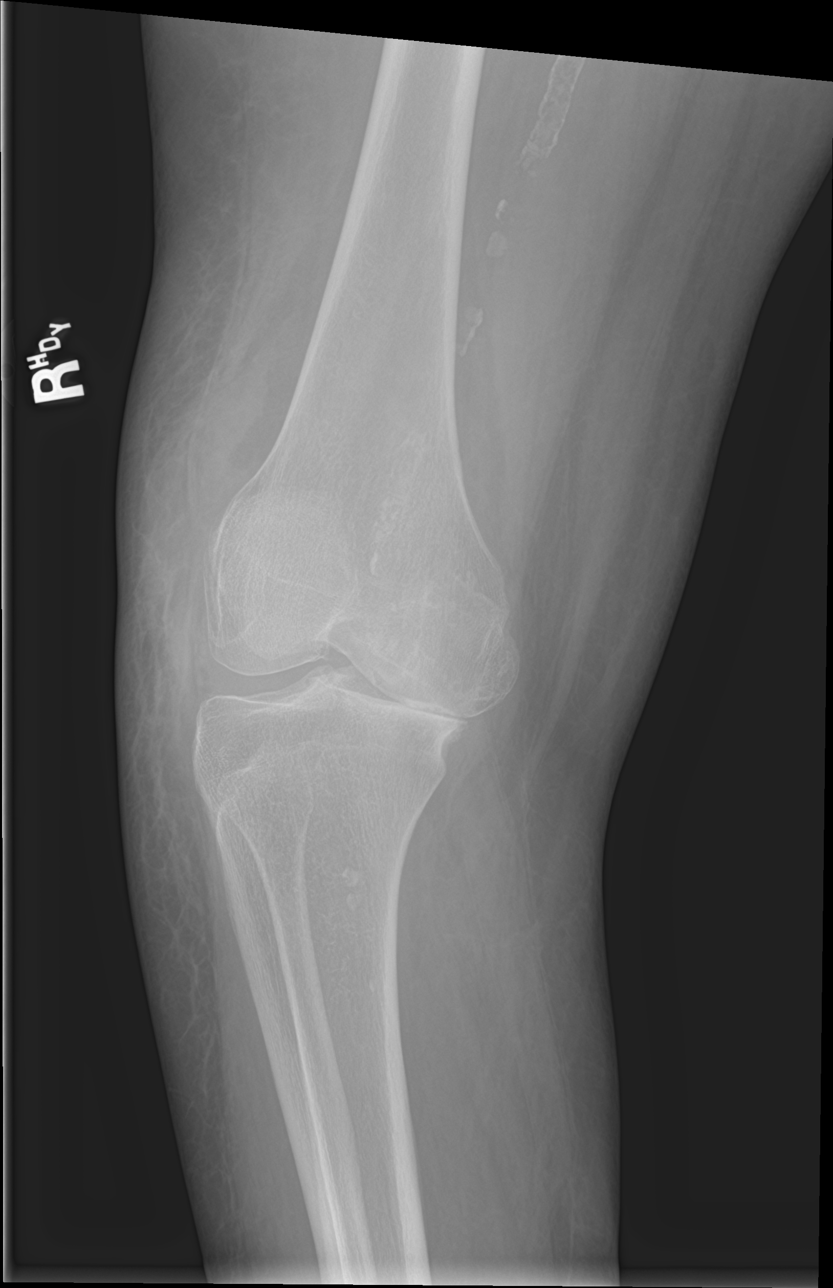

[knee obl (2 of 2)]
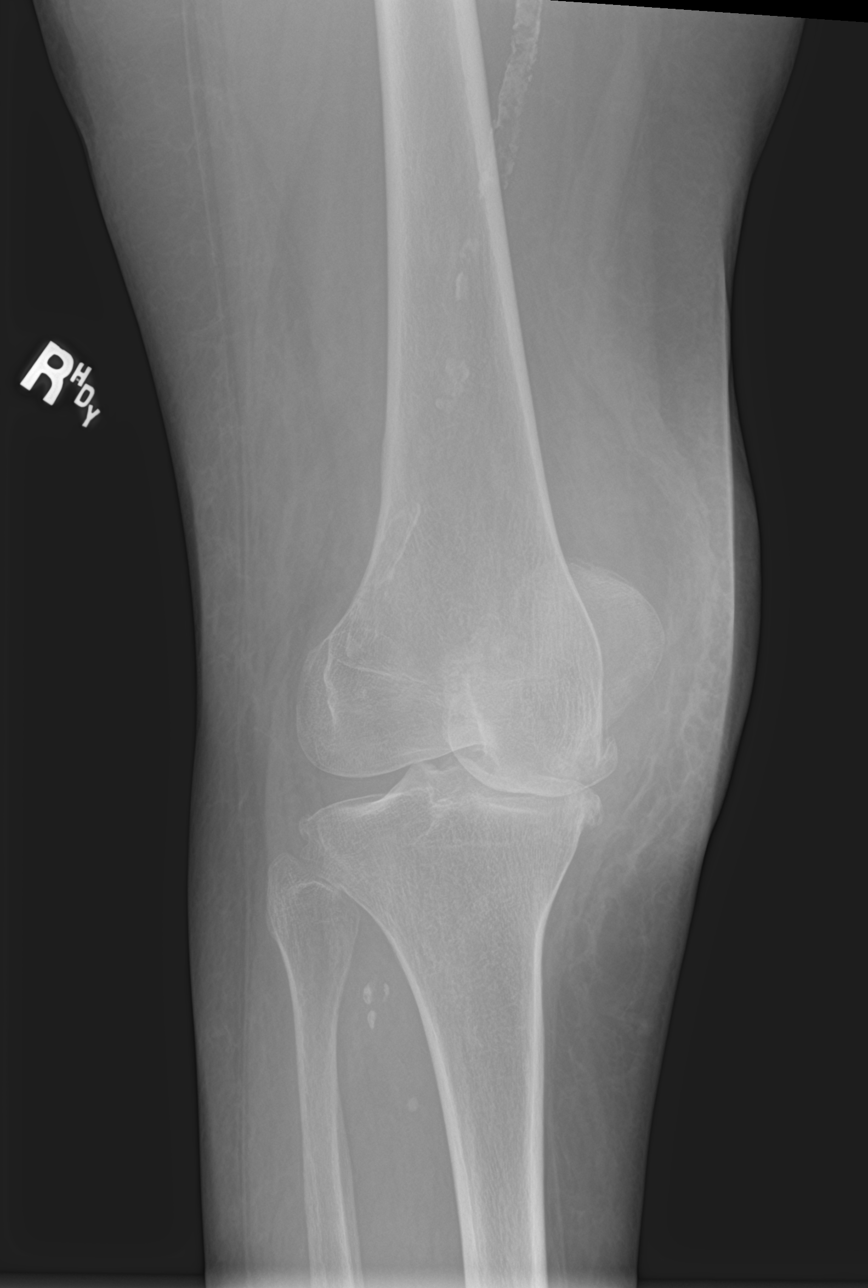

[knee ap]
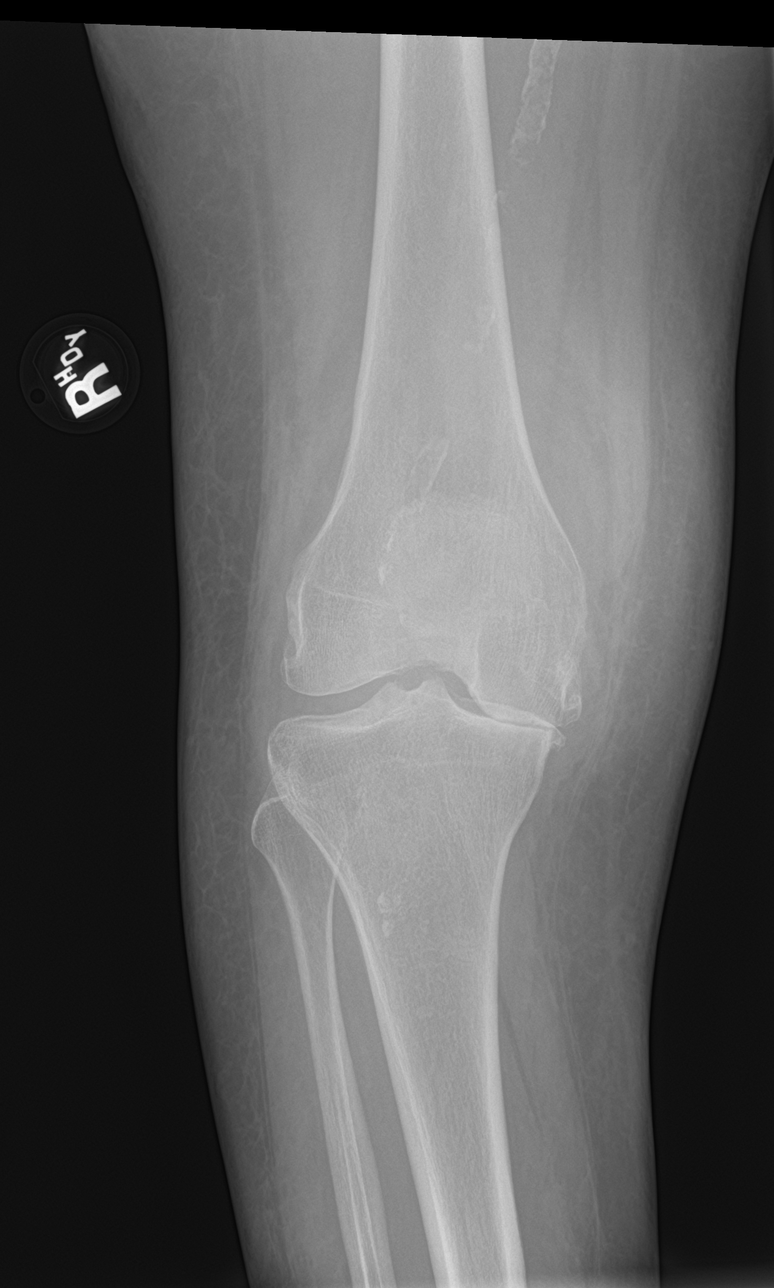

[4 of 4 positions shown; findings below may reference images not displayed]

FINDINGS: Anterior soft tissue swelling. No joint effusion. Vascular
calcifications. Degenerative changes in the medial compartment with
loss of joint space. No acute fractures identified.
IMPRESSION: Anterior soft tissue swelling. Severe degenerative changes in the
medial compartment with loss of joint space. No acute fracture
noted.

## 2020-05-11 IMAGING — CT CT ABD-PELV W/O CM
2 of 4 series · 16 of 46 positions shown, 18 images · non-contrast
Comparison: 04/12/2016

CLINICAL DATA: LEFT side abdominal and pelvic pain for 2 weeks
question diverticulitis, history of CHF, stroke, hypertension,
atrial fibrillation, MI, end-stage renal disease on dialysis, smoker

EXAM:
CT ABDOMEN AND PELVIS WITHOUT CONTRAST
TECHNIQUE: Multidetector CT imaging of the abdomen and pelvis was performed
following the standard protocol without IV contrast. Sagittal and
coronal MPR images reconstructed from axial data set. No oral
contrast administered.

[Series 2: routine abd/pel wo · axial · 0.78mm/px · z∈[-989,-609]mm · 13 of 84 slices shown, 15 images]
[im 4/84  soft-tissue]
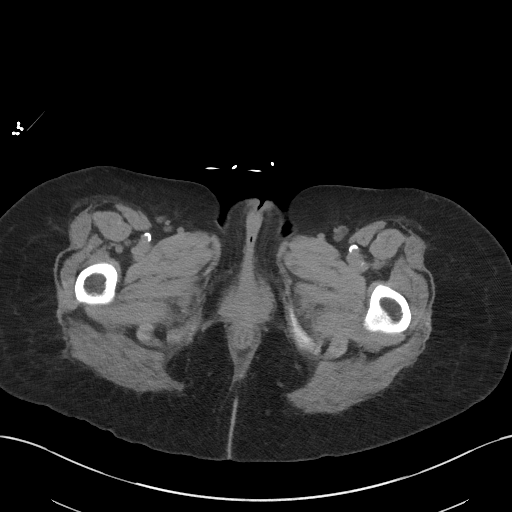
[im 4/84  bone]
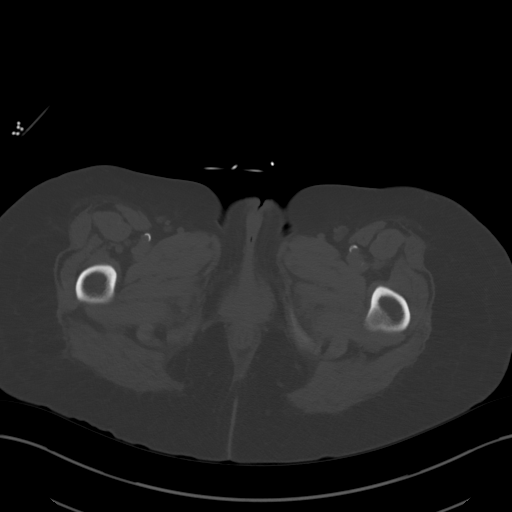
[im 10/84  soft-tissue]
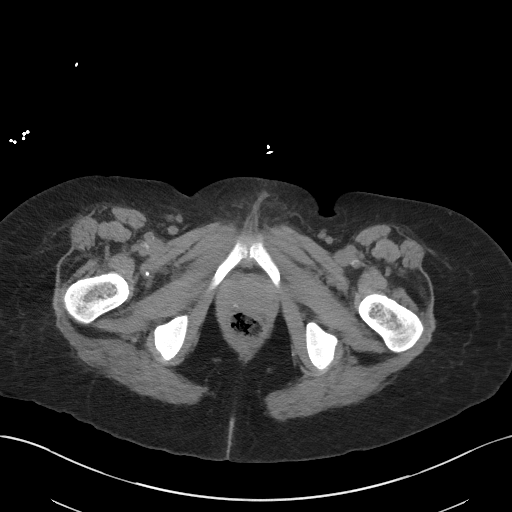
[im 16/84  soft-tissue]
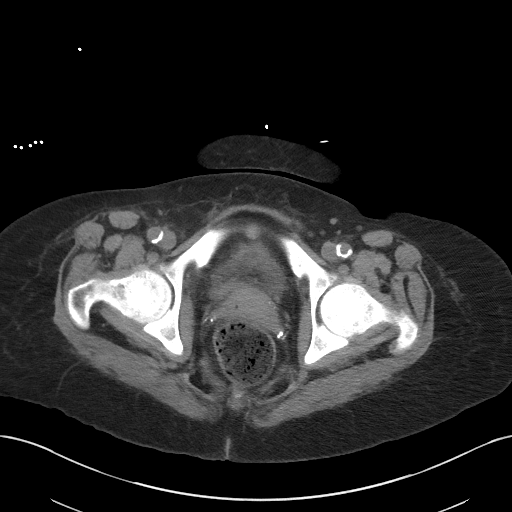
[im 23/84  soft-tissue]
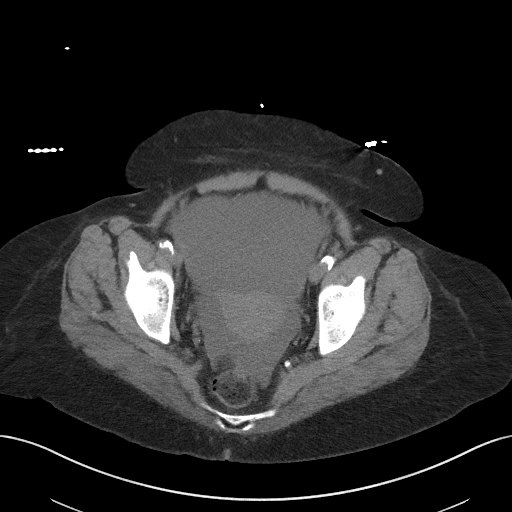
[im 29/84  soft-tissue]
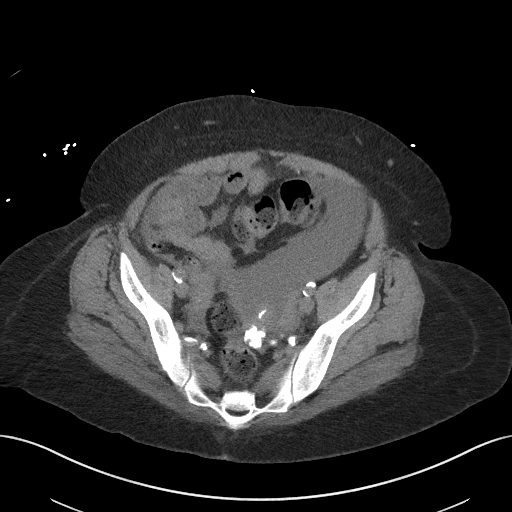
[im 36/84  soft-tissue]
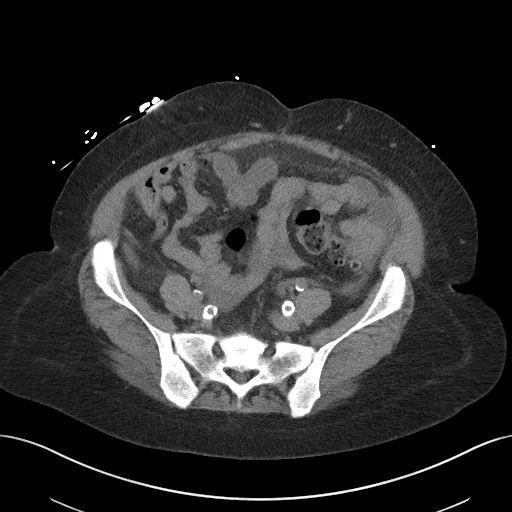
[im 42/84  soft-tissue]
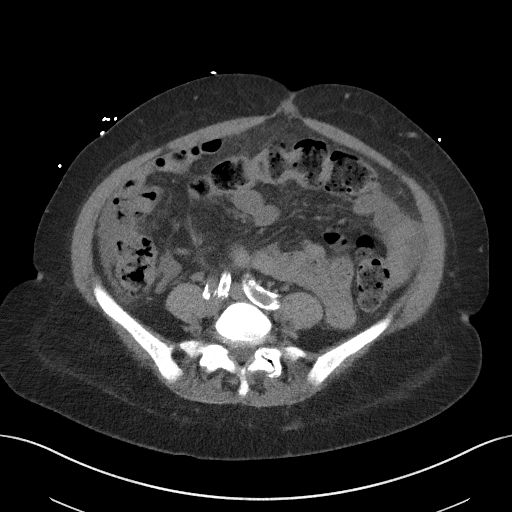
[im 48/84  soft-tissue]
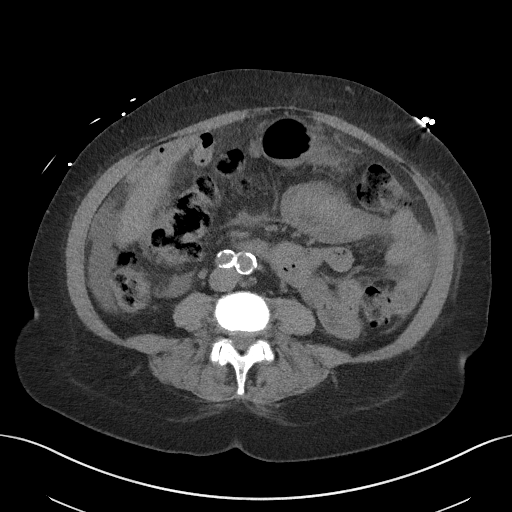
[im 55/84  soft-tissue]
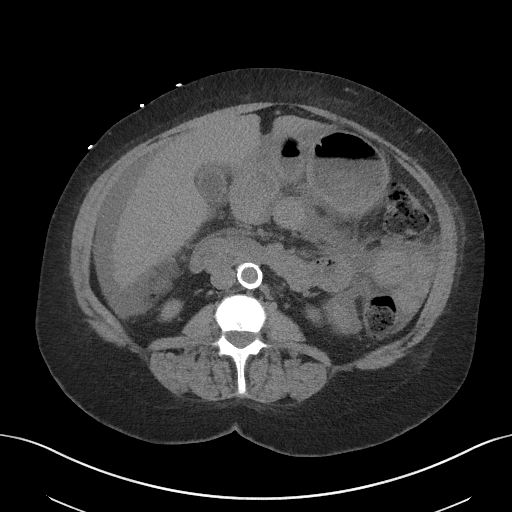
[im 55/84  bone]
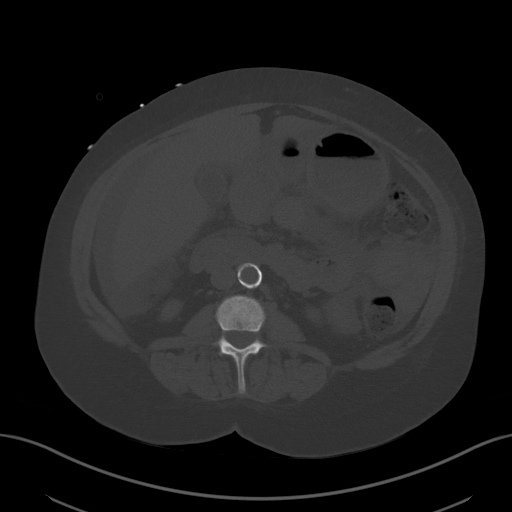
[im 61/84  soft-tissue]
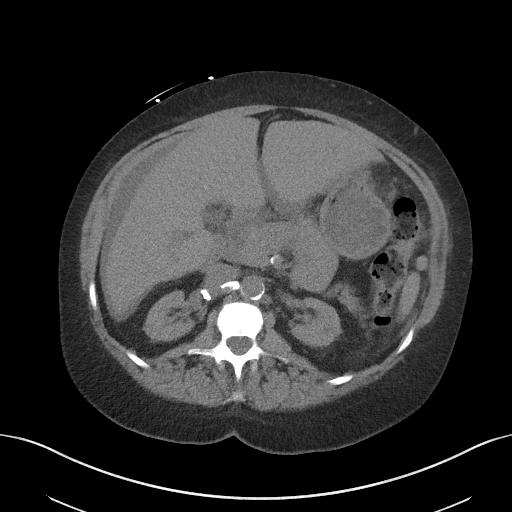
[im 68/84  soft-tissue]
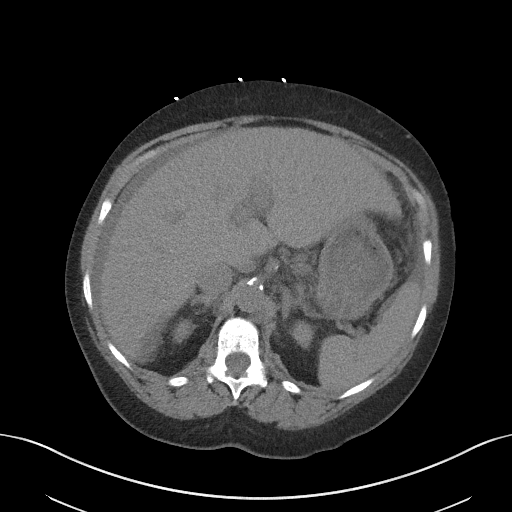
[im 74/84  soft-tissue]
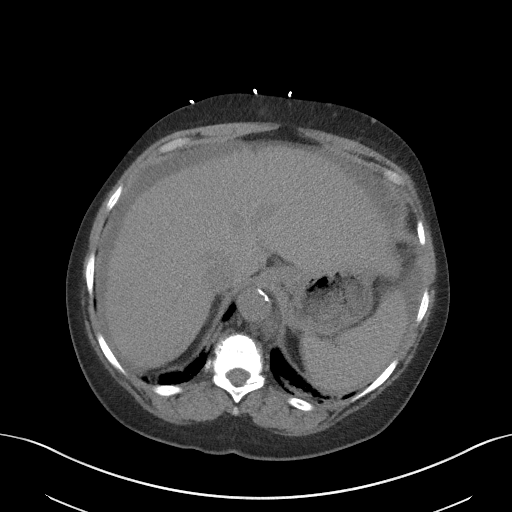
[im 80/84  soft-tissue]
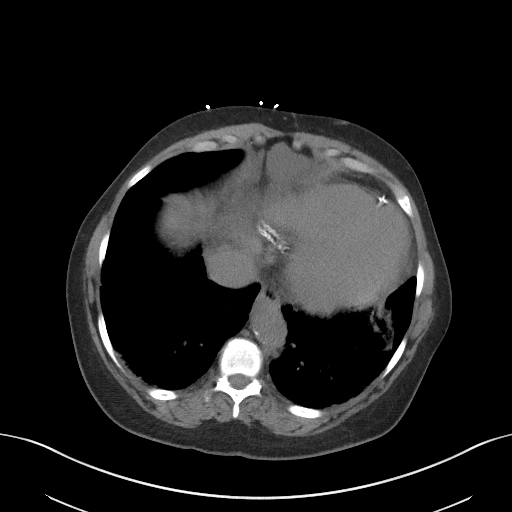

[Series 5: coronal st · coronal · 0.73mm/px · 3 of 98 slices shown]
[im 33/98  soft-tissue]
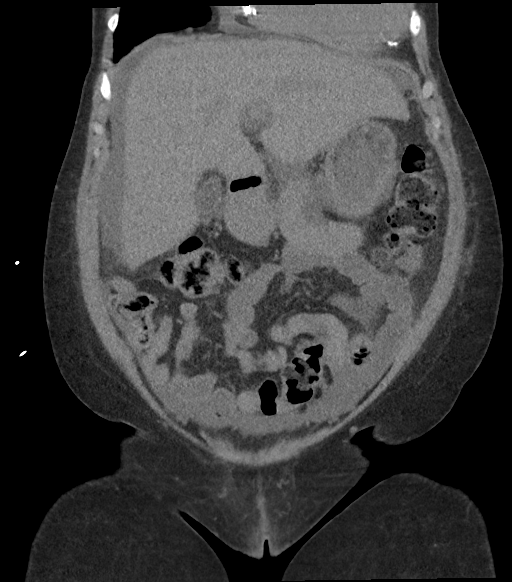
[im 44/98  soft-tissue]
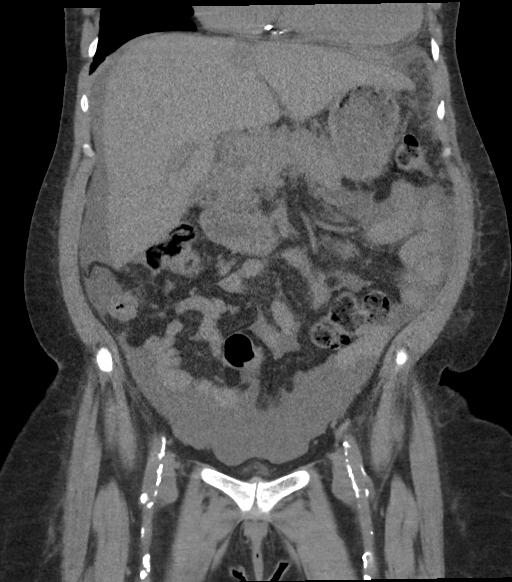
[im 54/98  soft-tissue]
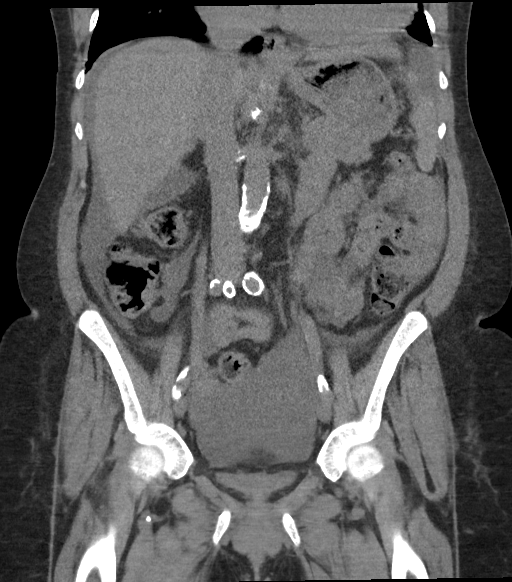

[16 of 46 positions shown; findings below may reference images not displayed]

FINDINGS: Lower chest: Mild bibasilar atelectasis

Hepatobiliary: Dependent 8 mm gallstone in gallbladder. Gallbladder
wall appears thickened, though this is nonspecific in the setting of
ascites. No focal hepatic hepatic mass lesion question minimally
nodular hepatic contours.

Pancreas: Normal appearance

Spleen: Normal appearance

Adrenals/Urinary Tract: BILATERAL renal cortical atrophy and
thinning period 16 mm LEFT renal cyst. No additional renal mass or
hydronephrosis. Thickened renal glands without discrete mass.
Ureters and decompressed bladder unremarkable.

Stomach/Bowel: Normal appendix. A few small bowel loops in the LEFT
upper abdomen are upper normal in prominence though no definite
evidence of obstruction or wall thickening is seen. Stomach and
bowel loops otherwise normal appearance.

Vascular/Lymphatic: Numerous pelvic phleboliths. Extensive
atherosclerotic calcifications of coronary arteries, aorta, visceral
arteries, iliac arteries. Aorta normal caliber. No adenopathy.
Scattered normal sized gastrohepatic ligament and retroperitoneal
nodes.

Reproductive: Calcified leiomyomata within uterus. Otherwise
unremarkable uterus and ovaries.

Other: Significant low-attenuation ascites throughout abdomen and
pelvis. No free air. No definite mass. Tiny umbilical hernia
containing fat.

Musculoskeletal: Mildly heterogeneous diffuse osteosclerosis suspect
renal osteodystrophy.
IMPRESSION: Extensive ascites throughout the abdomen and pelvis with
questionable slightly nodular hepatic contour along the inferior
RIGHT lobe, can not exclude cirrhosis.

Cholelithiasis. BILATERAL renal cortical atrophy consistent with
end-stage renal disease.

Extensive atherosclerotic calcifications.

## 2020-05-15 IMAGING — DX DG CHEST 1V PORT
1 series · 1 of 1 positions shown · non-contrast
Comparison: Chest radiograph May 28, 2018

CLINICAL DATA: Upper chest pain, abdominal pain and shortness of
breath. Status post dialysis today.

EXAM:
PORTABLE CHEST 1 VIEW

[chest ap]
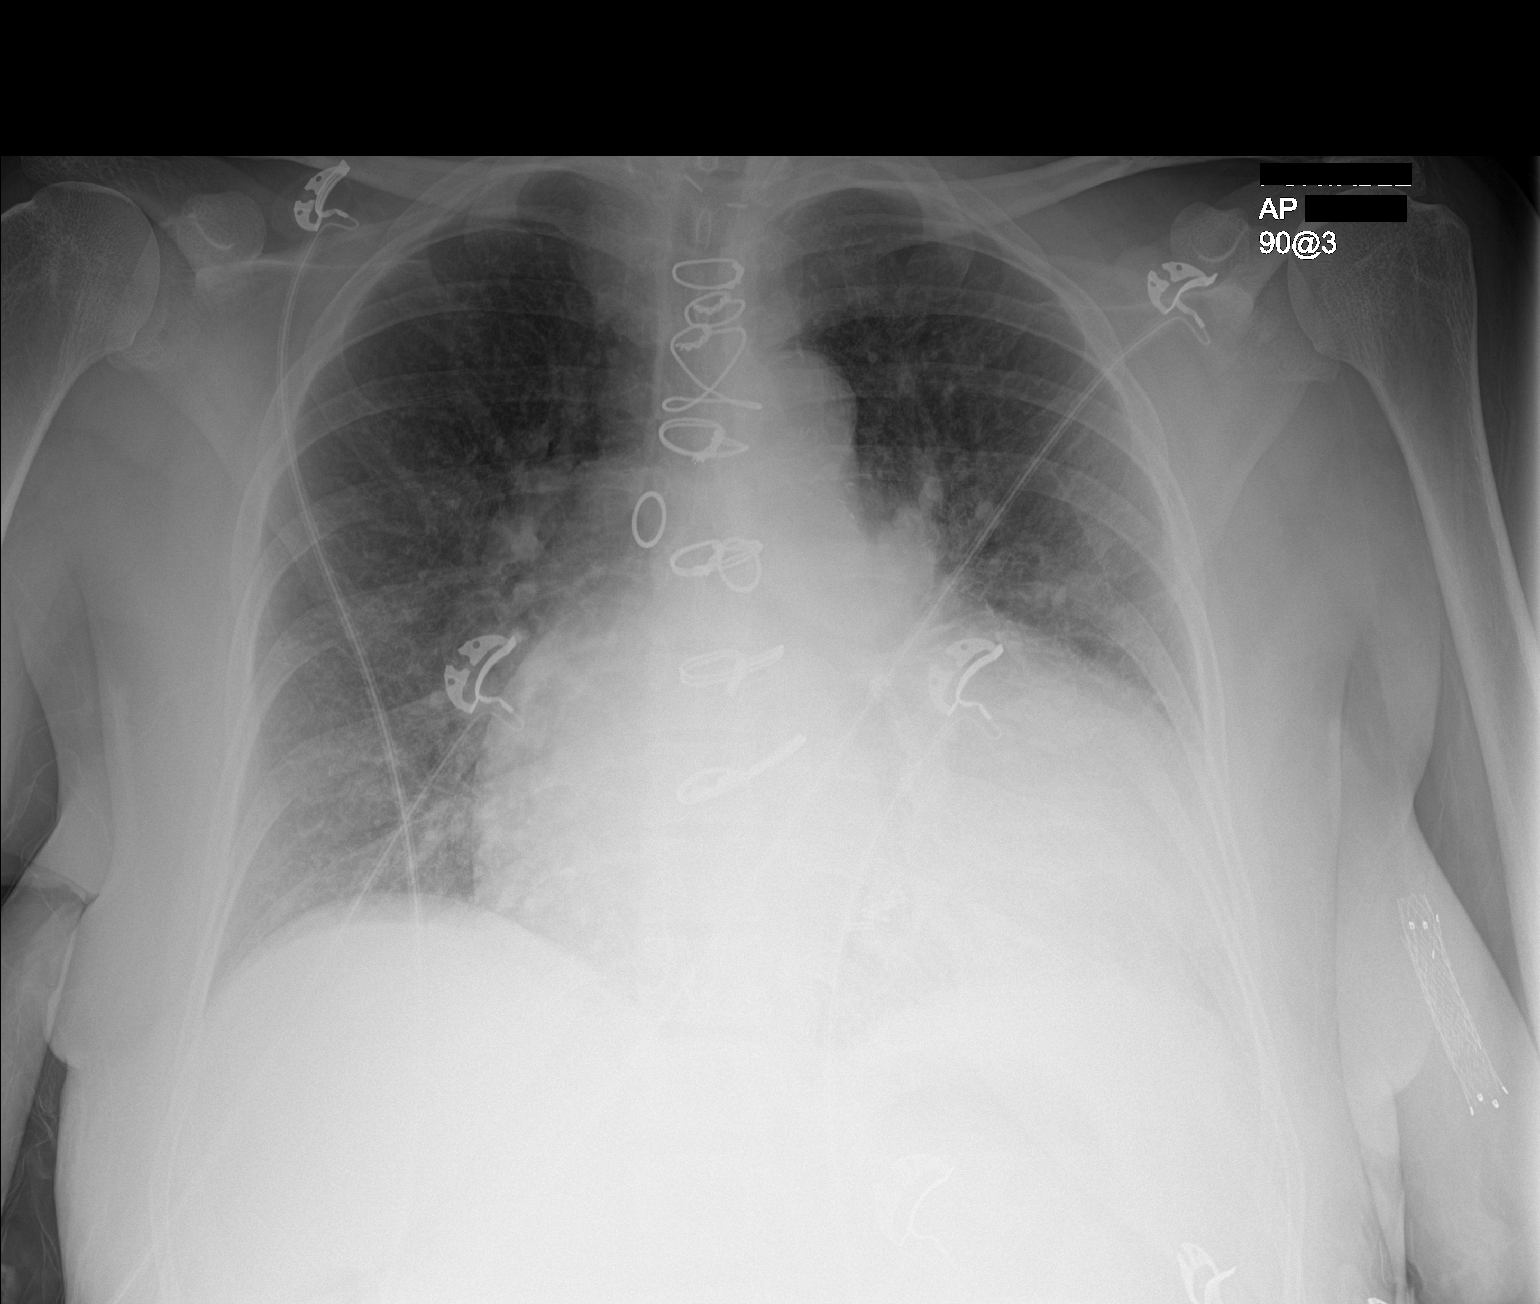

[1 of 1 positions shown; findings below may reference images not displayed]

FINDINGS: Stable cardiomegaly. Coronary artery calcification or stent. Status
post median sternotomy for CABG. Mildly increased pulmonary vascular
congestion and mild interstitial prominence. No pleural effusion or
focal consolidation. No pneumothorax. Vascular stent LEFT arm.
IMPRESSION: Stable cardiomegaly. Increasing interstitial prominence most
consistent with pulmonary edema.

## 2020-05-21 ENCOUNTER — Ambulatory Visit (INDEPENDENT_AMBULATORY_CARE_PROVIDER_SITE_OTHER): Payer: Medicare Other | Admitting: Vascular Surgery

## 2020-05-21 ENCOUNTER — Encounter (INDEPENDENT_AMBULATORY_CARE_PROVIDER_SITE_OTHER): Payer: Medicare Other

## 2020-05-23 ENCOUNTER — Telehealth (INDEPENDENT_AMBULATORY_CARE_PROVIDER_SITE_OTHER): Payer: Self-pay

## 2020-05-23 NOTE — Telephone Encounter (Signed)
Contacted the patient to get her rescheduled for her LUE fistulagram and the patient stated her potassium is 7 so she will call to reschedule her angio when she is able to do so.

## 2020-07-19 IMAGING — CR DG CHEST 1V
1 series · 1 of 1 positions shown · non-contrast
Comparison: 06/01/2018 chest radiograph

CLINICAL DATA: 57 y/o  F; chest pain after dialysis.

EXAM:
CHEST  1 VIEW

[dg chest 1 view]
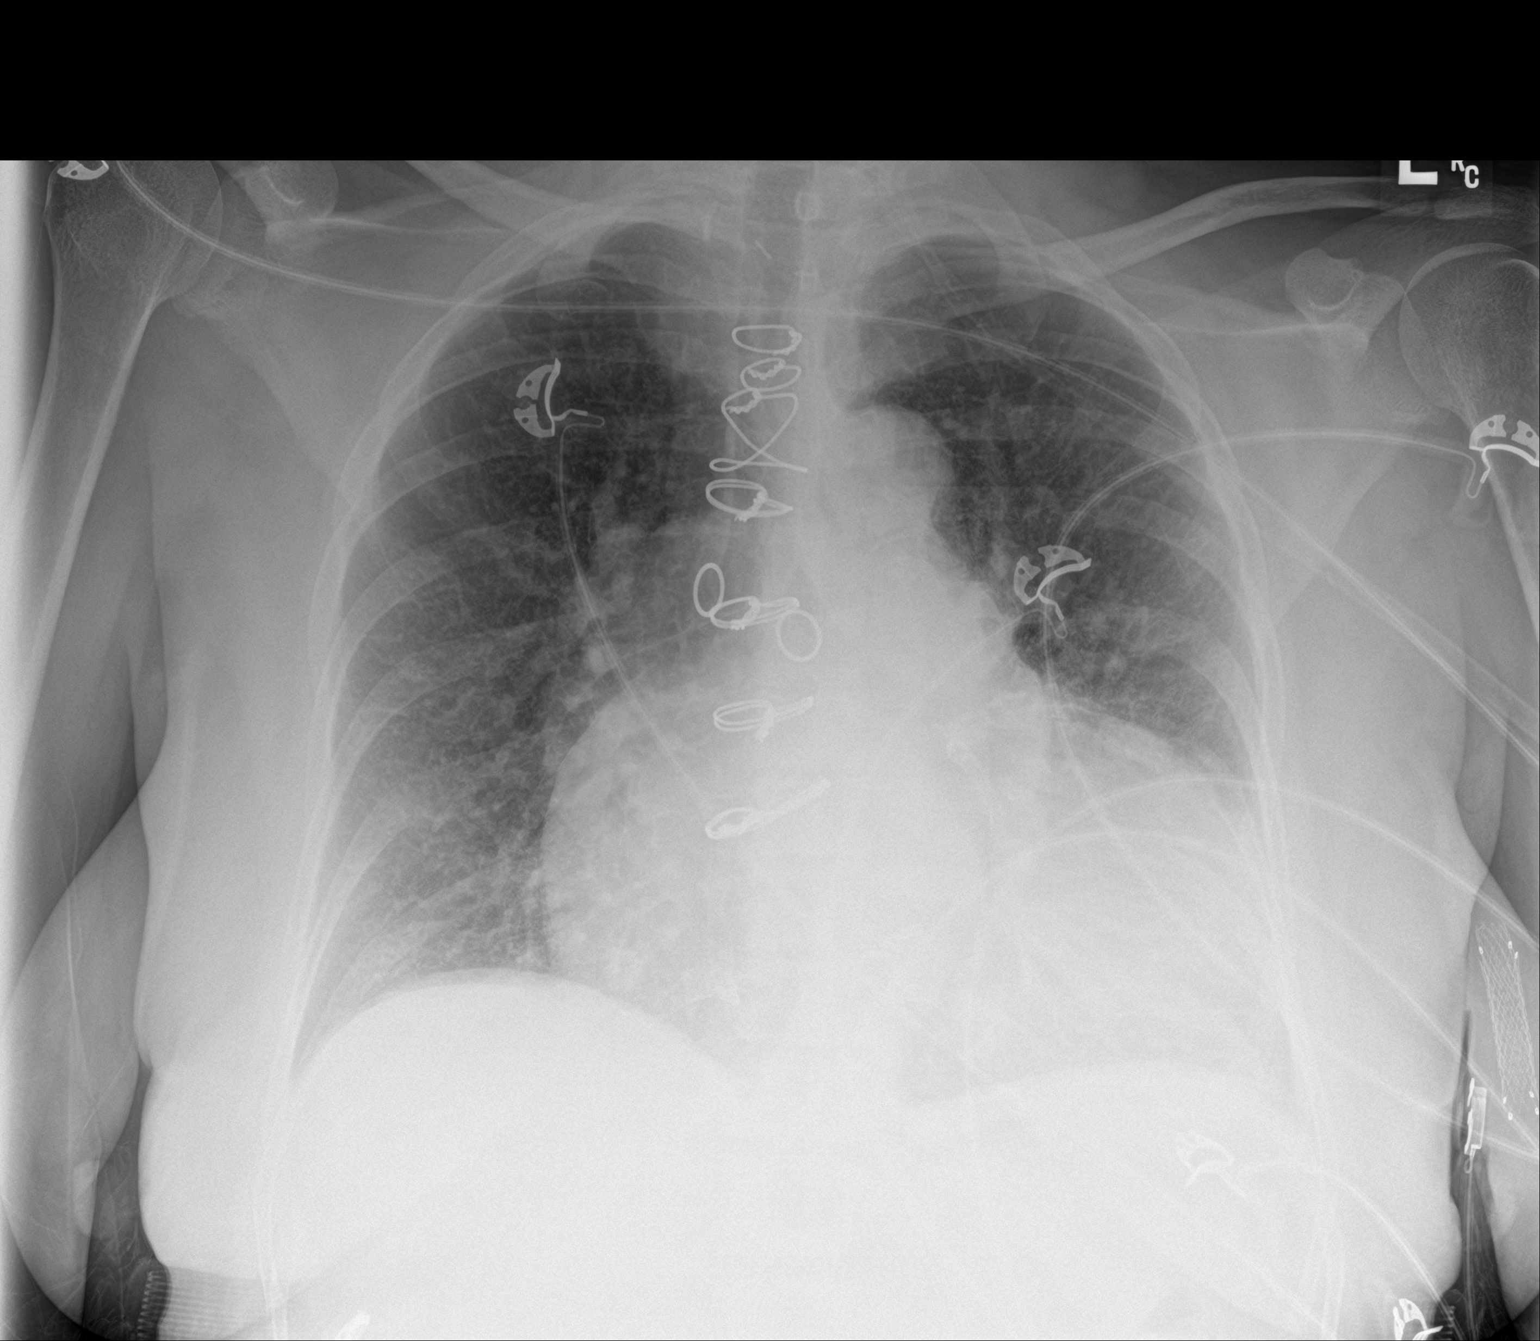

[1 of 1 positions shown; findings below may reference images not displayed]

FINDINGS: Stable cardiomegaly given projection and technique. Post median
sternotomy with wires in alignment. Diffuse reticular opacities
compatible with interstitial pulmonary edema. No focal
consolidation. No pleural effusion or pneumothorax. No acute osseous
abnormality is evident.
IMPRESSION: Interstitial pulmonary edema. Stable cardiomegaly.

## 2020-07-19 IMAGING — DX DG TIBIA/FIBULA 2V*L*
4 series · 4 of 4 positions shown · non-contrast
Comparison: Left lower extremity radiograph dated 07/18/2015

CLINICAL DATA: 57-year-old female with fall and trauma to the left
lower extremity.

EXAM:
LEFT TIBIA AND FIBULA - 2 VIEW

[tibia ap (1 of 2)]
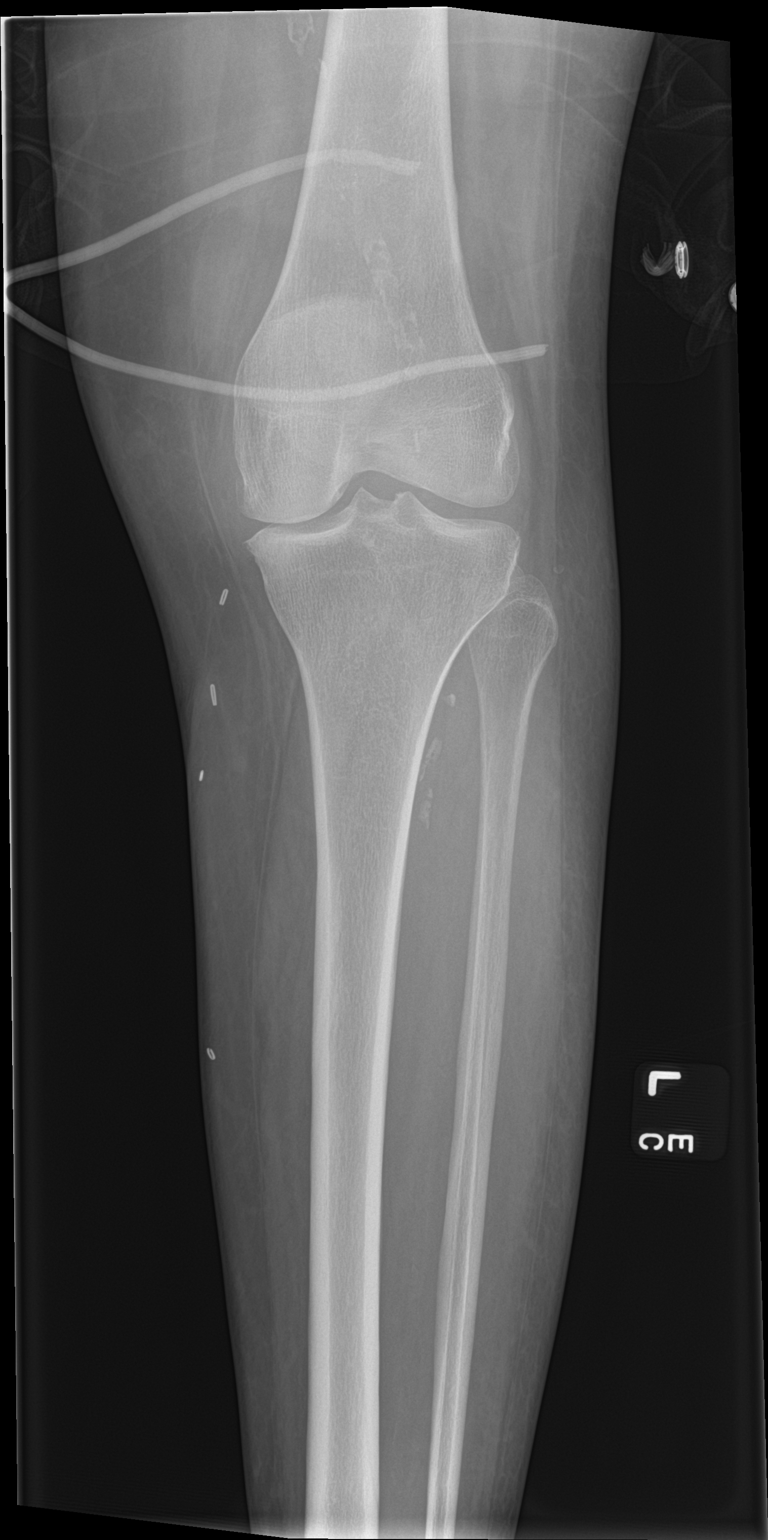

[tibia ap (2 of 2)]
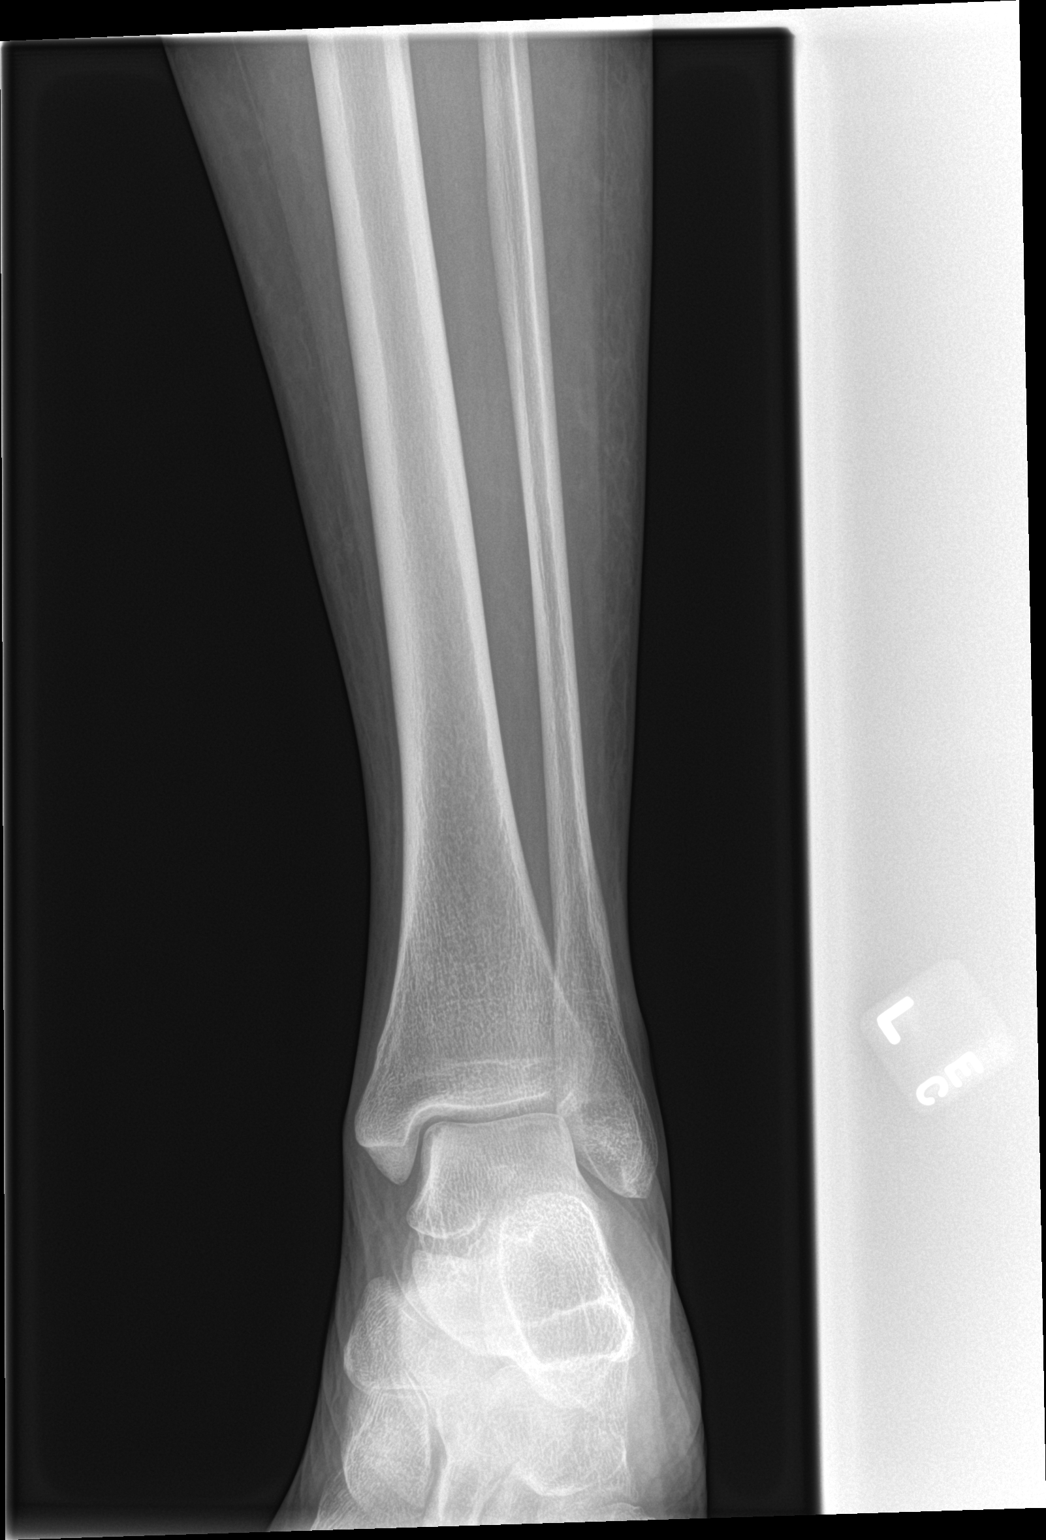

[tibia lat (1 of 2)]
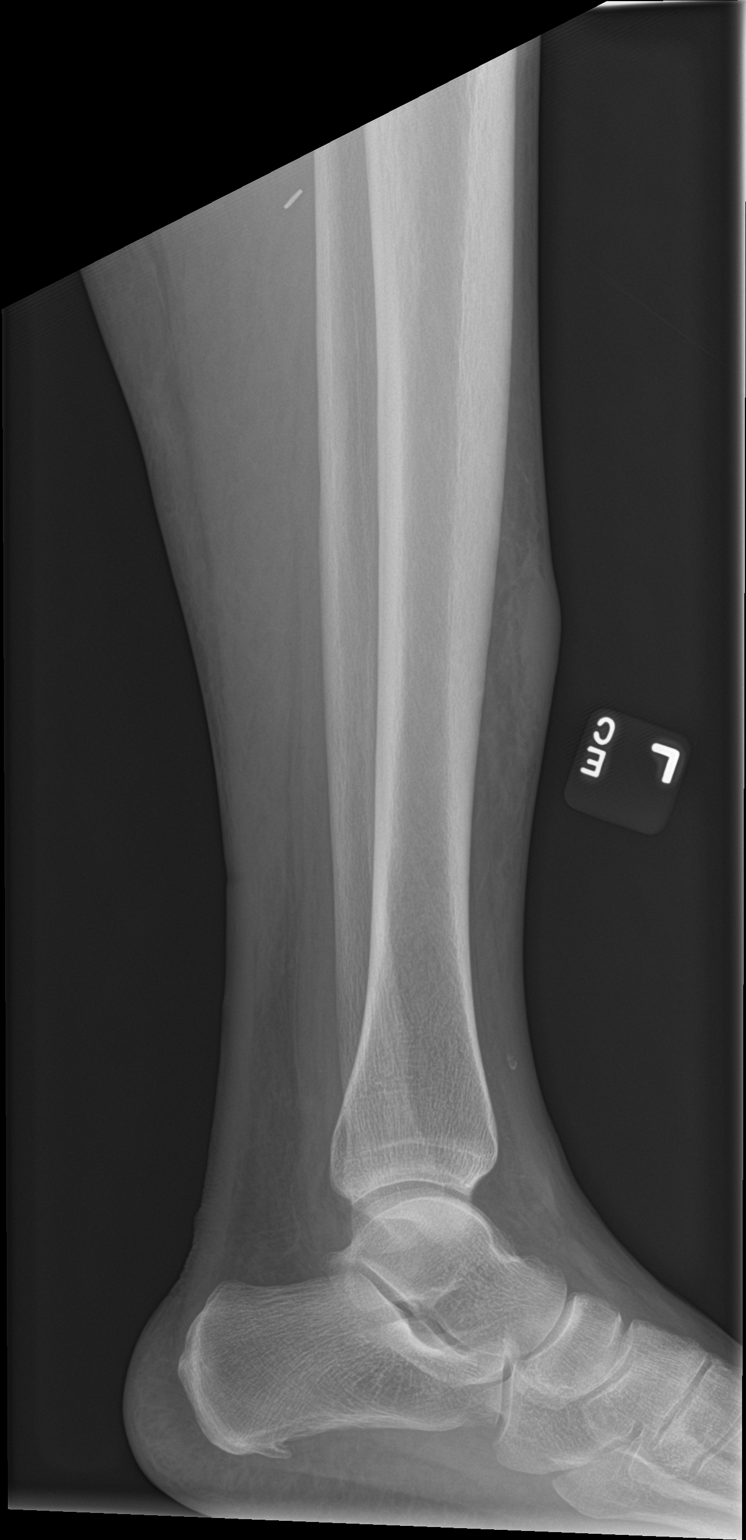

[tibia lat (2 of 2)]
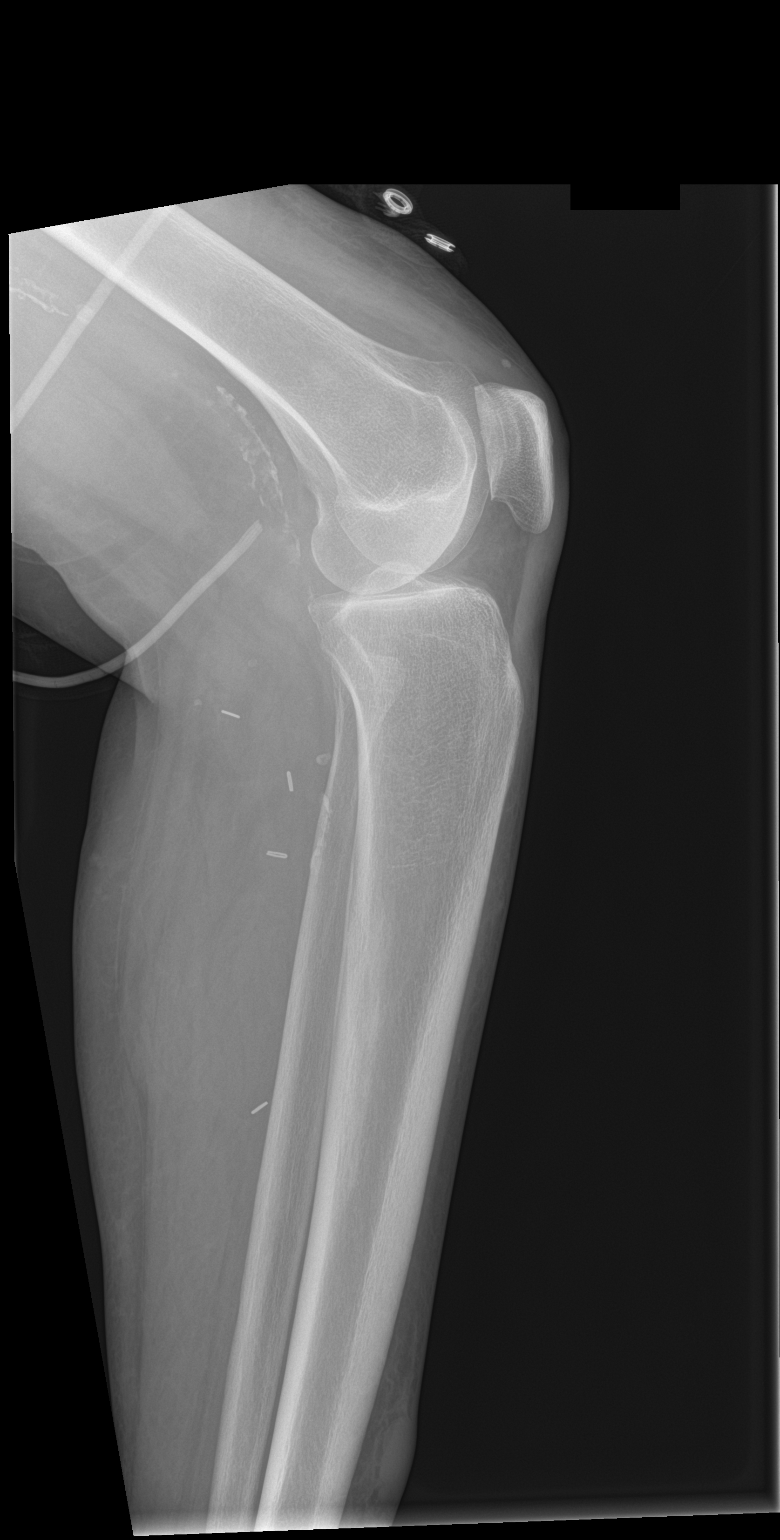

[4 of 4 positions shown; findings below may reference images not displayed]

FINDINGS: There is no acute fracture or dislocation. The bones are well
mineralized. No arthritic changes. Vascular calcifications noted.
There is a focal area of skin contusion or hematoma along the
anterior shin. Surgical clips noted in the soft tissues of the calf.
IMPRESSION: No acute fracture or dislocation.

## 2020-07-19 IMAGING — CT CT HEAD W/O CM
4 series · 16 of 47 positions shown, 18 images · non-contrast
Comparison: Head CT dated 01/13/2016 and brain MRI dated 06/24/2014

CLINICAL DATA: 57-year-old female with dizziness and fall.

EXAM:
CT HEAD WITHOUT CONTRAST
TECHNIQUE: Contiguous axial images were obtained from the base of the skull
through the vertex without intravenous contrast.

[Series 2: head wo · axial · 0.42mm/px · z∈[-118,-18]mm · 7 of 28 slices shown, 9 images]
[im 4/28  brain]
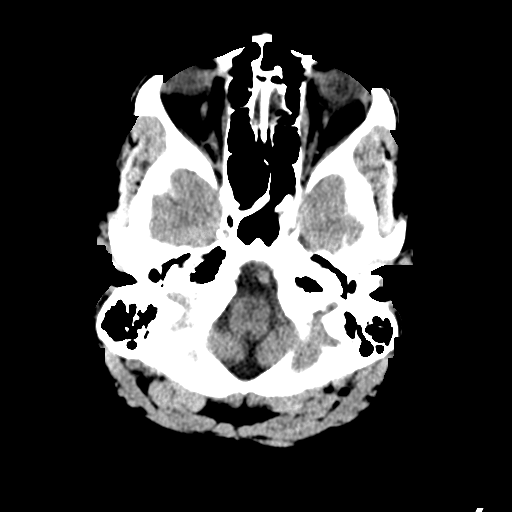
[im 4/28  bone]
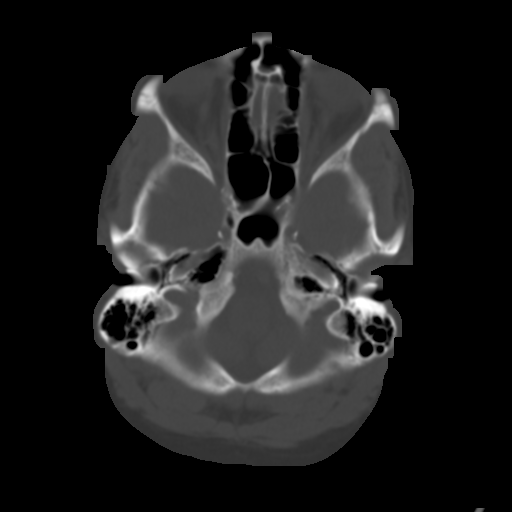
[im 7/28  brain]
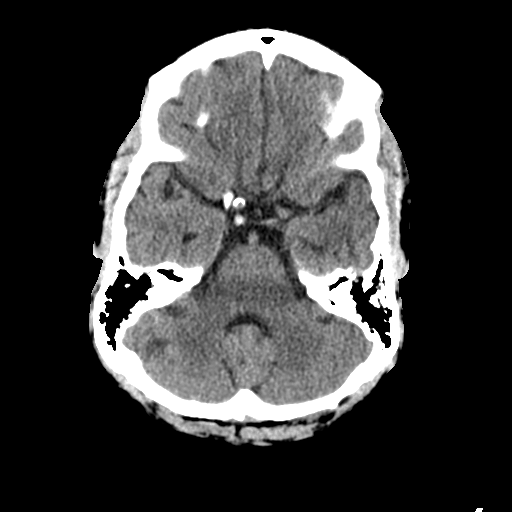
[im 11/28  brain]
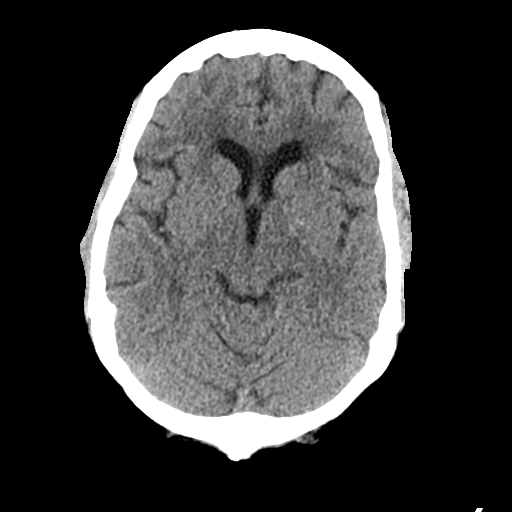
[im 14/28  brain]
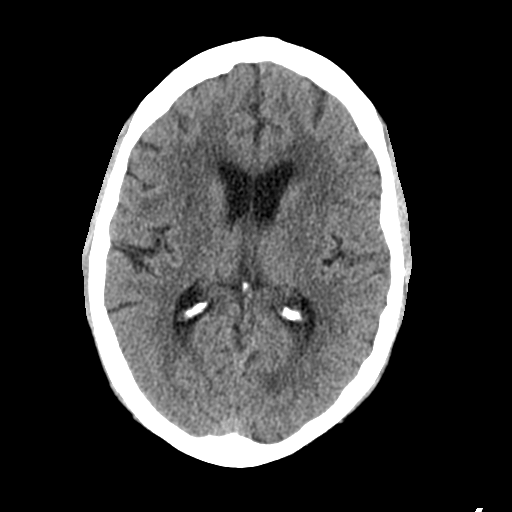
[im 17/28  brain]
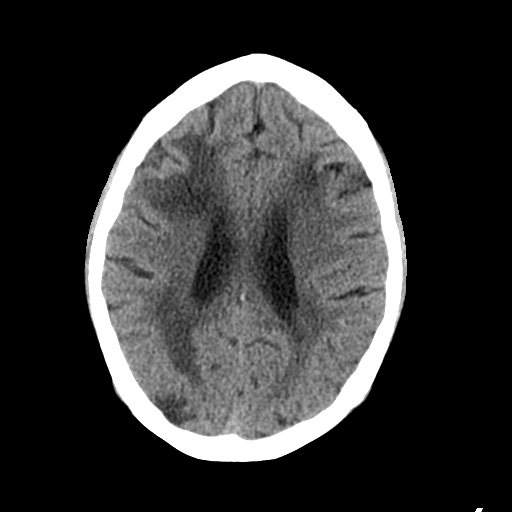
[im 17/28  bone]
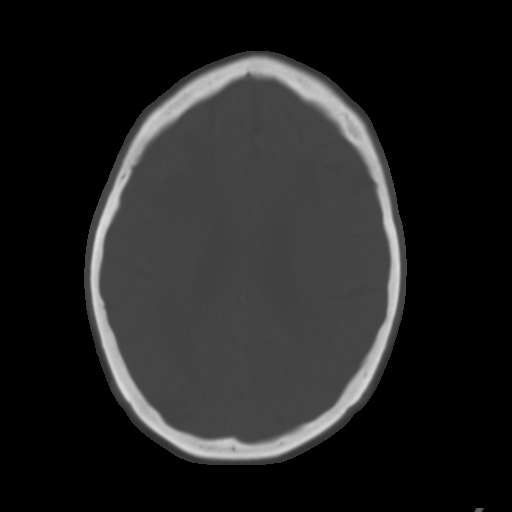
[im 21/28  brain]
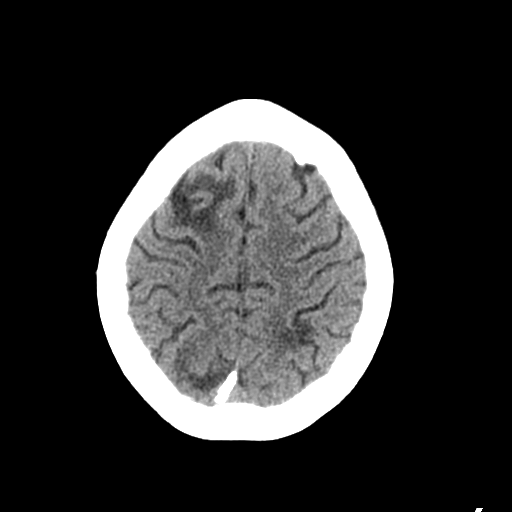
[im 24/28  brain]
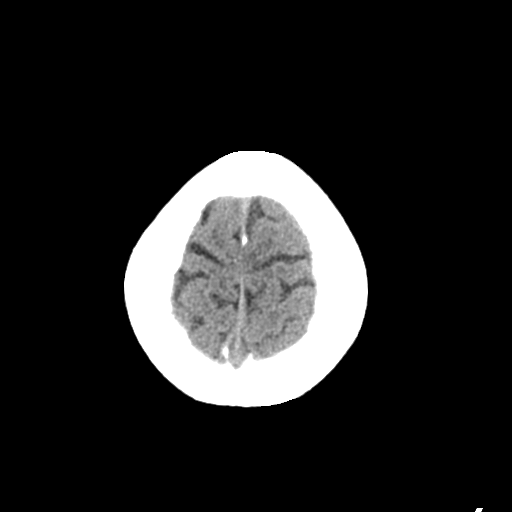

[Series 3: head bone · axial · 0.42mm/px · z∈[-121,-93]mm · 3 of 70 slices shown]
[im 7/70  bone]
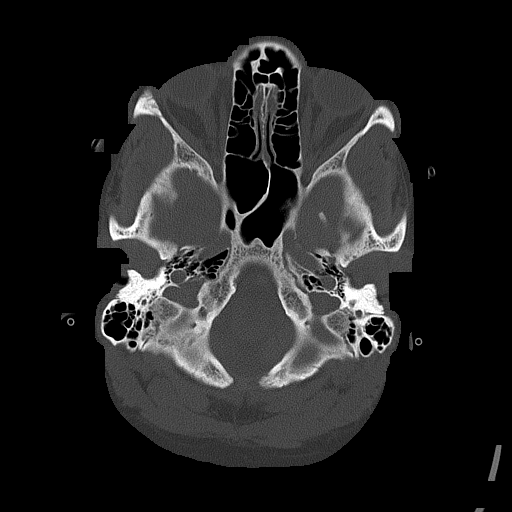
[im 14/70  bone]
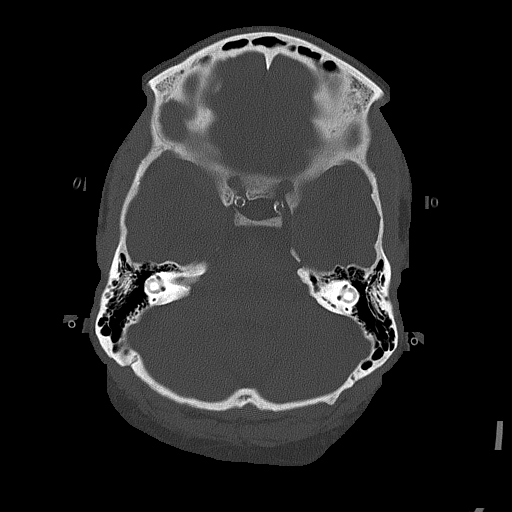
[im 21/70  bone]
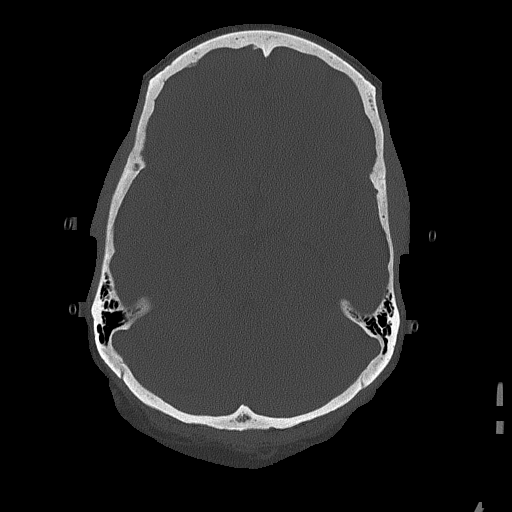

[Series 4: coronal soft tissue · coronal · 0.30mm/px · 3 of 63 slices shown]
[im 21/63  brain]
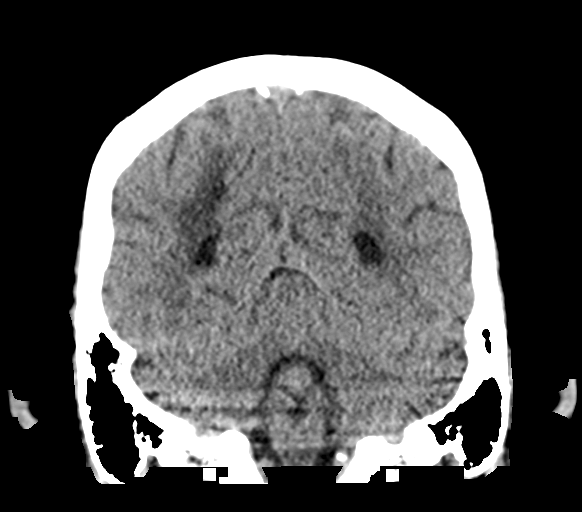
[im 28/63  brain]
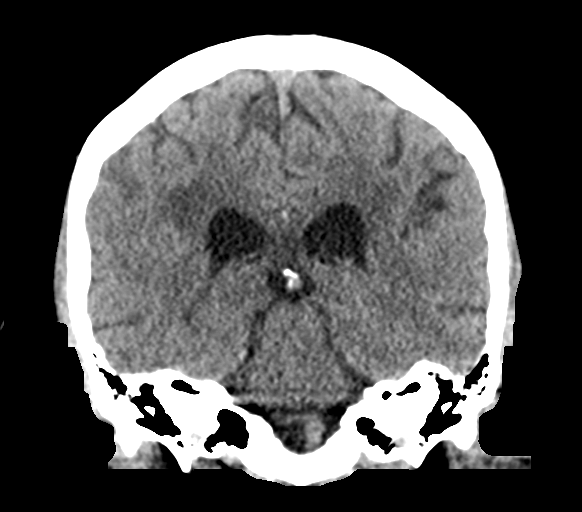
[im 35/63  brain]
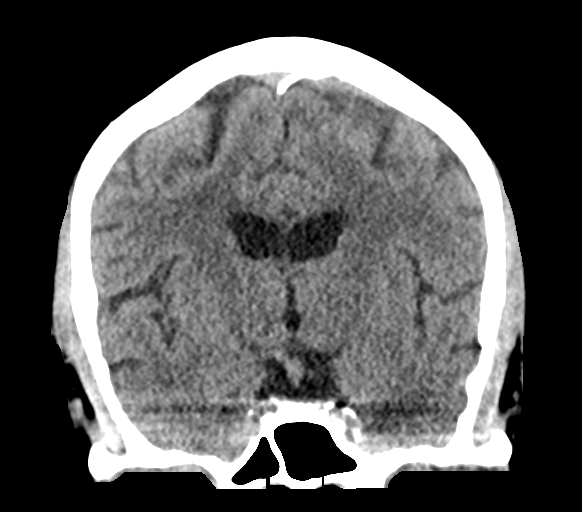

[Series 5: sagittal soft tissue · sagittal · 0.29mm/px · 3 of 48 slices shown]
[im 16/48  brain]
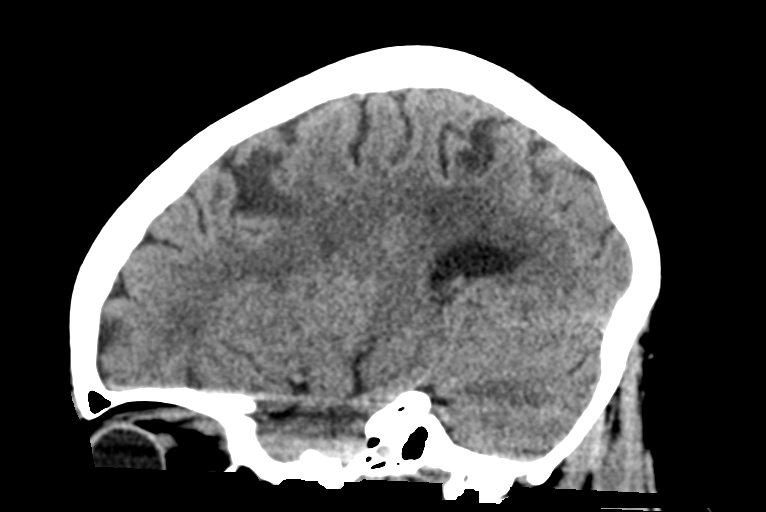
[im 24/48  brain]
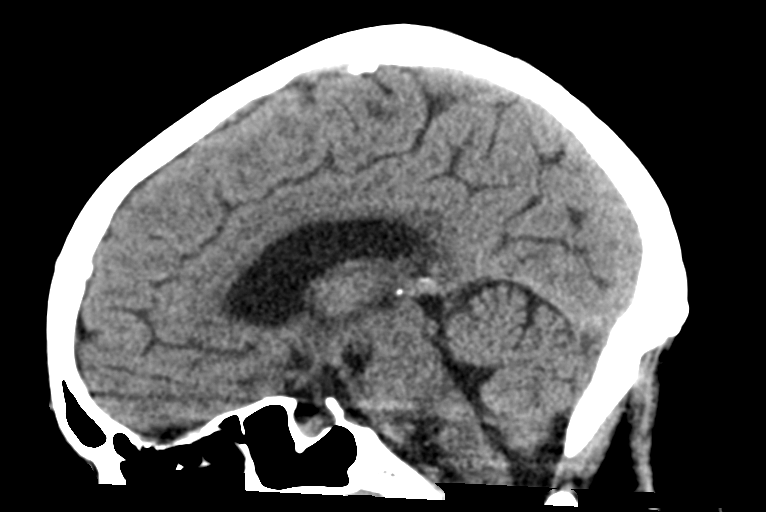
[im 32/48  brain]
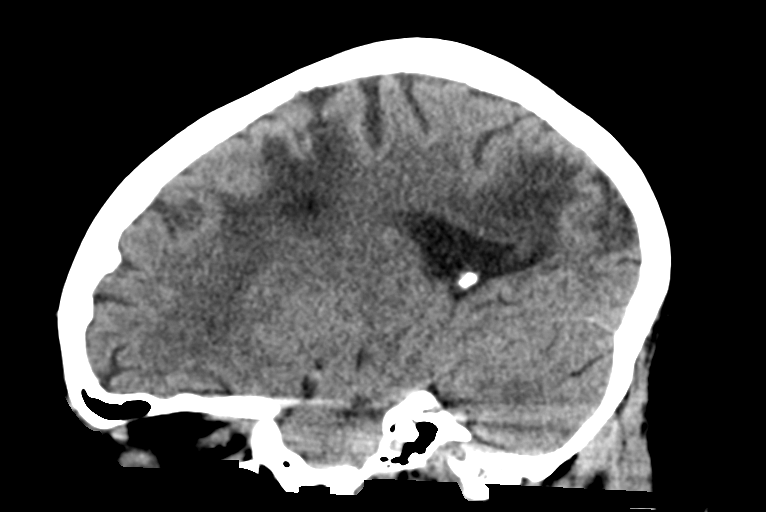

[16 of 47 positions shown; findings below may reference images not displayed]

FINDINGS: Brain: Scattered areas of old infarcts similar to prior CT, possibly
sequela of embolic process. No acute intracranial hemorrhage. No
mass effect or midline shift. No extra-axial fluid collection.

Vascular: No hyperdense vessel or unexpected calcification.

Skull: Normal. Negative for fracture or focal lesion.

Sinuses/Orbits: No acute finding.

Other: None
IMPRESSION: 1. No acute intracranial hemorrhage.
2. Scattered areas of old infarcts similar to prior CT, possibly
sequela of embolic process.

## 2020-07-25 ENCOUNTER — Inpatient Hospital Stay
Admission: EM | Admit: 2020-07-25 | Discharge: 2020-07-27 | DRG: 082 | Disposition: A | Discharge status: Home / Self Care | Payer: Medicare Other | Attending: Internal Medicine | Admitting: Internal Medicine

## 2020-07-25 ENCOUNTER — Emergency Department: Payer: Medicare Other

## 2020-07-25 ENCOUNTER — Other Ambulatory Visit: Payer: Self-pay

## 2020-07-25 ENCOUNTER — Observation Stay: Payer: Medicare Other

## 2020-07-25 DIAGNOSIS — N189 Chronic kidney disease, unspecified: Secondary | ICD-10-CM | POA: Diagnosis present

## 2020-07-25 DIAGNOSIS — R55 Syncope and collapse: Secondary | ICD-10-CM | POA: Diagnosis not present

## 2020-07-25 DIAGNOSIS — I10 Essential (primary) hypertension: Secondary | ICD-10-CM | POA: Diagnosis present

## 2020-07-25 DIAGNOSIS — F419 Anxiety disorder, unspecified: Secondary | ICD-10-CM | POA: Diagnosis present

## 2020-07-25 DIAGNOSIS — F17210 Nicotine dependence, cigarettes, uncomplicated: Secondary | ICD-10-CM | POA: Diagnosis present

## 2020-07-25 DIAGNOSIS — I2510 Atherosclerotic heart disease of native coronary artery without angina pectoris: Secondary | ICD-10-CM | POA: Diagnosis present

## 2020-07-25 DIAGNOSIS — Z91048 Other nonmedicinal substance allergy status: Secondary | ICD-10-CM

## 2020-07-25 DIAGNOSIS — I480 Paroxysmal atrial fibrillation: Secondary | ICD-10-CM | POA: Diagnosis present

## 2020-07-25 DIAGNOSIS — K7460 Unspecified cirrhosis of liver: Secondary | ICD-10-CM | POA: Diagnosis present

## 2020-07-25 DIAGNOSIS — Z79890 Hormone replacement therapy (postmenopausal): Secondary | ICD-10-CM

## 2020-07-25 DIAGNOSIS — F32A Depression, unspecified: Secondary | ICD-10-CM | POA: Diagnosis present

## 2020-07-25 DIAGNOSIS — Z91040 Latex allergy status: Secondary | ICD-10-CM

## 2020-07-25 DIAGNOSIS — I9589 Other hypotension: Secondary | ICD-10-CM | POA: Diagnosis present

## 2020-07-25 DIAGNOSIS — I959 Hypotension, unspecified: Secondary | ICD-10-CM | POA: Diagnosis present

## 2020-07-25 DIAGNOSIS — I132 Hypertensive heart and chronic kidney disease with heart failure and with stage 5 chronic kidney disease, or end stage renal disease: Secondary | ICD-10-CM | POA: Diagnosis present

## 2020-07-25 DIAGNOSIS — Z8249 Family history of ischemic heart disease and other diseases of the circulatory system: Secondary | ICD-10-CM

## 2020-07-25 DIAGNOSIS — I4892 Unspecified atrial flutter: Secondary | ICD-10-CM | POA: Diagnosis present

## 2020-07-25 DIAGNOSIS — R531 Weakness: Secondary | ICD-10-CM

## 2020-07-25 DIAGNOSIS — W19XXXA Unspecified fall, initial encounter: Secondary | ICD-10-CM | POA: Diagnosis present

## 2020-07-25 DIAGNOSIS — I5022 Chronic systolic (congestive) heart failure: Secondary | ICD-10-CM | POA: Diagnosis present

## 2020-07-25 DIAGNOSIS — G8929 Other chronic pain: Secondary | ICD-10-CM | POA: Diagnosis present

## 2020-07-25 DIAGNOSIS — I420 Dilated cardiomyopathy: Secondary | ICD-10-CM | POA: Diagnosis present

## 2020-07-25 DIAGNOSIS — N186 End stage renal disease: Secondary | ICD-10-CM

## 2020-07-25 DIAGNOSIS — Z992 Dependence on renal dialysis: Secondary | ICD-10-CM

## 2020-07-25 DIAGNOSIS — B1920 Unspecified viral hepatitis C without hepatic coma: Secondary | ICD-10-CM | POA: Diagnosis present

## 2020-07-25 DIAGNOSIS — E039 Hypothyroidism, unspecified: Secondary | ICD-10-CM | POA: Diagnosis present

## 2020-07-25 DIAGNOSIS — R188 Other ascites: Secondary | ICD-10-CM | POA: Diagnosis present

## 2020-07-25 DIAGNOSIS — Z841 Family history of disorders of kidney and ureter: Secondary | ICD-10-CM

## 2020-07-25 DIAGNOSIS — Z885 Allergy status to narcotic agent status: Secondary | ICD-10-CM

## 2020-07-25 DIAGNOSIS — D631 Anemia in chronic kidney disease: Secondary | ICD-10-CM | POA: Diagnosis present

## 2020-07-25 DIAGNOSIS — R402413 Glasgow coma scale score 13-15, at hospital admission: Secondary | ICD-10-CM | POA: Diagnosis present

## 2020-07-25 DIAGNOSIS — Y92019 Unspecified place in single-family (private) house as the place of occurrence of the external cause: Secondary | ICD-10-CM

## 2020-07-25 DIAGNOSIS — S065XAA Traumatic subdural hemorrhage with loss of consciousness status unknown, initial encounter: Secondary | ICD-10-CM | POA: Diagnosis present

## 2020-07-25 DIAGNOSIS — Z7902 Long term (current) use of antithrombotics/antiplatelets: Secondary | ICD-10-CM

## 2020-07-25 DIAGNOSIS — Z7982 Long term (current) use of aspirin: Secondary | ICD-10-CM

## 2020-07-25 DIAGNOSIS — Z8673 Personal history of transient ischemic attack (TIA), and cerebral infarction without residual deficits: Secondary | ICD-10-CM

## 2020-07-25 DIAGNOSIS — Z20822 Contact with and (suspected) exposure to covid-19: Secondary | ICD-10-CM | POA: Diagnosis present

## 2020-07-25 DIAGNOSIS — Z888 Allergy status to other drugs, medicaments and biological substances status: Secondary | ICD-10-CM

## 2020-07-25 DIAGNOSIS — E785 Hyperlipidemia, unspecified: Secondary | ICD-10-CM | POA: Diagnosis present

## 2020-07-25 DIAGNOSIS — I313 Pericardial effusion (noninflammatory): Secondary | ICD-10-CM | POA: Diagnosis present

## 2020-07-25 DIAGNOSIS — S065X9A Traumatic subdural hemorrhage with loss of consciousness of unspecified duration, initial encounter: Principal | ICD-10-CM | POA: Diagnosis present

## 2020-07-25 DIAGNOSIS — I252 Old myocardial infarction: Secondary | ICD-10-CM

## 2020-07-25 DIAGNOSIS — Z881 Allergy status to other antibiotic agents status: Secondary | ICD-10-CM

## 2020-07-25 DIAGNOSIS — Z951 Presence of aortocoronary bypass graft: Secondary | ICD-10-CM

## 2020-07-25 DIAGNOSIS — Z803 Family history of malignant neoplasm of breast: Secondary | ICD-10-CM

## 2020-07-25 DIAGNOSIS — Z79899 Other long term (current) drug therapy: Secondary | ICD-10-CM

## 2020-07-25 DIAGNOSIS — Z8041 Family history of malignant neoplasm of ovary: Secondary | ICD-10-CM

## 2020-07-25 HISTORY — DX: Syncope and collapse: R55

## 2020-07-25 LAB — BASIC METABOLIC PANEL
Anion gap: 14 (ref 5–15)
BUN: 42 mg/dL — ABNORMAL HIGH (ref 6–20)
CO2: 26 mmol/L (ref 22–32)
Calcium: 8.1 mg/dL — ABNORMAL LOW (ref 8.9–10.3)
Chloride: 99 mmol/L (ref 98–111)
Creatinine, Ser: 6.49 mg/dL — ABNORMAL HIGH (ref 0.44–1.00)
GFR, Estimated: 7 mL/min — ABNORMAL LOW (ref >60)
Glucose, Bld: 101 mg/dL — ABNORMAL HIGH (ref 70–99)
Potassium: 4.5 mmol/L (ref 3.5–5.1)
Sodium: 139 mmol/L (ref 135–145)

## 2020-07-25 LAB — CBC
HCT: 38.3 % (ref 36.0–46.0)
Hemoglobin: 12.2 g/dL (ref 12.0–15.0)
MCH: 33.2 pg (ref 26.0–34.0)
MCHC: 31.9 g/dL (ref 30.0–36.0)
MCV: 104.4 fL — ABNORMAL HIGH (ref 80.0–100.0)
Platelets: 152 10*3/uL (ref 150–400)
RBC: 3.67 MIL/uL — ABNORMAL LOW (ref 3.87–5.11)
RDW: 13.2 % (ref 11.5–15.5)
WBC: 3.3 10*3/uL — ABNORMAL LOW (ref 4.0–10.5)
nRBC: 0 % (ref 0.0–0.2)

## 2020-07-25 LAB — RESP PANEL BY RT-PCR (FLU A&B, COVID) ARPGX2
Influenza A by PCR: NEGATIVE
Influenza B by PCR: NEGATIVE
SARS Coronavirus 2 by RT PCR: NEGATIVE

## 2020-07-25 LAB — TSH: TSH: 0.145 u[IU]/mL — ABNORMAL LOW (ref 0.350–4.500)

## 2020-07-25 MED ORDER — ACETAMINOPHEN 325 MG PO TABS
325.0000 mg | ORAL_TABLET | Freq: Four times a day (QID) | ORAL | Status: DC | PRN
  Administered 2020-07-25 – 2020-07-26 (×3): 325 mg via ORAL
  Filled 2020-07-25 (×3): qty 1

## 2020-07-25 MED ORDER — MIDODRINE HCL 5 MG PO TABS
10.0000 mg | ORAL_TABLET | ORAL | Status: DC | PRN
  Administered 2020-07-26: 10 mg via ORAL
  Administered 2020-07-26: 20 mg via ORAL
  Filled 2020-07-25: qty 1
  Filled 2020-07-25 (×2): qty 4
  Filled 2020-07-25: qty 2
  Filled 2020-07-25: qty 4

## 2020-07-25 MED ORDER — SACUBITRIL-VALSARTAN 49-51 MG PO TABS
1.0000 | ORAL_TABLET | Freq: Two times a day (BID) | ORAL | Status: DC
  Filled 2020-07-25 (×2): qty 1

## 2020-07-25 MED ORDER — ISOSORBIDE MONONITRATE ER 60 MG PO TB24
60.0000 mg | ORAL_TABLET | Freq: Every day | ORAL | Status: DC
  Filled 2020-07-25: qty 1

## 2020-07-25 MED ORDER — OXYCODONE-ACETAMINOPHEN 5-325 MG PO TABS
1.0000 | ORAL_TABLET | Freq: Two times a day (BID) | ORAL | Status: DC | PRN
  Administered 2020-07-25 – 2020-07-27 (×4): 1 via ORAL
  Filled 2020-07-25 (×4): qty 1

## 2020-07-25 MED ORDER — MIDODRINE HCL 5 MG PO TABS
10.0000 mg | ORAL_TABLET | Freq: Once | ORAL | Status: AC
  Administered 2020-07-25: 10 mg via ORAL
  Filled 2020-07-25: qty 2

## 2020-07-25 MED ORDER — SEVELAMER CARBONATE 800 MG PO TABS
800.0000 mg | ORAL_TABLET | ORAL | Status: DC | PRN
  Filled 2020-07-25: qty 1

## 2020-07-25 MED ORDER — SODIUM CHLORIDE 0.9% FLUSH
3.0000 mL | Freq: Two times a day (BID) | INTRAVENOUS | Status: DC
  Administered 2020-07-25 – 2020-07-27 (×5): 3 mL via INTRAVENOUS

## 2020-07-25 MED ORDER — ONDANSETRON HCL 4 MG PO TABS: 4.0000 mg | ORAL_TABLET | Freq: Four times a day (QID) | ORAL | Status: DC | PRN

## 2020-07-25 MED ORDER — LEVOTHYROXINE SODIUM 50 MCG PO TABS
75.0000 ug | ORAL_TABLET | Freq: Every day | ORAL | Status: DC
  Administered 2020-07-26 – 2020-07-27 (×2): 75 ug via ORAL
  Filled 2020-07-25: qty 1
  Filled 2020-07-25: qty 2

## 2020-07-25 MED ORDER — ONDANSETRON HCL 4 MG/2ML IJ SOLN: 4.0000 mg | Freq: Four times a day (QID) | INTRAMUSCULAR | Status: DC | PRN

## 2020-07-25 MED ORDER — SEVELAMER CARBONATE 800 MG PO TABS
2400.0000 mg | ORAL_TABLET | Freq: Three times a day (TID) | ORAL | Status: DC
  Administered 2020-07-26 – 2020-07-27 (×3): 2400 mg via ORAL
  Filled 2020-07-25 (×6): qty 3

## 2020-07-25 MED ORDER — CARVEDILOL 6.25 MG PO TABS
12.5000 mg | ORAL_TABLET | Freq: Two times a day (BID) | ORAL | Status: DC
  Filled 2020-07-25: qty 2

## 2020-07-25 MED ORDER — ACETAMINOPHEN 650 MG RE SUPP: 325.0000 mg | Freq: Four times a day (QID) | RECTAL | Status: DC | PRN

## 2020-07-25 MED ORDER — PANTOPRAZOLE SODIUM 40 MG PO TBEC
40.0000 mg | DELAYED_RELEASE_TABLET | Freq: Every day | ORAL | Status: DC
  Administered 2020-07-25 – 2020-07-27 (×3): 40 mg via ORAL
  Filled 2020-07-25 (×5): qty 1

## 2020-07-25 MED ORDER — EMPTY CONTAINERS FLEXIBLE MISC
900.0000 mg | Freq: Once | Status: AC
  Administered 2020-07-25: 900 mg via INTRAVENOUS
  Filled 2020-07-25: qty 90

## 2020-07-25 MED ORDER — HYDROXYZINE HCL 25 MG PO TABS
50.0000 mg | ORAL_TABLET | Freq: Two times a day (BID) | ORAL | Status: DC | PRN
  Administered 2020-07-26 (×2): 50 mg via ORAL
  Filled 2020-07-25 (×2): qty 2

## 2020-07-25 MED ORDER — CINACALCET HCL 30 MG PO TABS
60.0000 mg | ORAL_TABLET | Freq: Every evening | ORAL | Status: DC
  Administered 2020-07-25: 60 mg via ORAL
  Filled 2020-07-25 (×3): qty 2

## 2020-07-25 MED ORDER — RANOLAZINE ER 500 MG PO TB12
500.0000 mg | ORAL_TABLET | Freq: Two times a day (BID) | ORAL | Status: DC
  Administered 2020-07-25 – 2020-07-27 (×3): 500 mg via ORAL
  Filled 2020-07-25 (×5): qty 1

## 2020-07-25 NOTE — ED Triage Notes (Signed)
Pt comes into the ED via EMS from home, states she went outside to feed some cats and woke up on the ground by her neighbor, pt is a/ox4 on arrival, states the back of her head is sore, denies any other pain or injury, pt is on Eliquis. Denies any recent illness

## 2020-07-25 NOTE — Consult Note (Signed)
Referring Physician:  No referring provider defined for this encounter.  Primary Physician:  [name], MD  Chief Complaint:  syncope  History of Present Illness: 07/25/2020 [name] is a 60 y.o. female who presents with the chief complaint of being found down after a syncopal event.  She hit her head with the fall.  She came to and is now at baseline.  She has a minor headache, but no N or V.  She denies any other neurologic symptoms.   Review of Systems:  A 10 point review of systems is negative, except for the pertinent positives and negatives detailed in the HPI.  Past Medical History: Past Medical History:  Diagnosis Date  . Anemia    chronic disease  . CHF (congestive heart failure) ([place])   . Coronary artery disease   . Heart failure ([place])   . Hepatitis    history of hep c  . Hyperlipidemia   . Hypertension   . Myocardial infarction ([place])   . Paroxysmal atrial fibrillation (HCC)   . Renal failure   . Renal insufficiency   . Stroke [facility]) 2011    Past Surgical History: Past Surgical History:  Procedure Laterality Date  . A/V FISTULAGRAM Left 03/30/2019   Procedure: A/V FISTULAGRAM;  Surgeon: Algernon Huxley, MD;  Location: [place] [facility];  Service: Cardiovascular;  Laterality: Left;  . A/V FISTULAGRAM Left 10/06/2019   Procedure: A/V FISTULAGRAM;  Surgeon: Algernon Huxley, MD;  Location: [place] [facility];  Service: Cardiovascular;  Laterality: Left;  . CORONARY ANGIOPLASTY WITH STENT PLACEMENT  2013  . CORONARY ARTERY BYPASS GRAFT    . DIALYSIS FISTULA CREATION    . FLEXIBLE SIGMOIDOSCOPY N/A 02/23/2019   Procedure: FLEXIBLE SIGMOIDOSCOPY;  Surgeon: Lollie Sails, MD;  Location: [facility] ENDOSCOPY;  Service: Endoscopy;  Laterality: N/A;  . LEFT HEART CATH AND CORS/GRAFTS ANGIOGRAPHY N/A 08/08/2018   Procedure: LEFT HEART CATH AND CORS/GRAFTS ANGIOGRAPHY;  Surgeon: Dionisio David, MD;  Location: [place] [facility];  Service: Cardiovascular;   Laterality: N/A;  . LEFT HEART CATH AND CORS/GRAFTS ANGIOGRAPHY N/A 01/30/2019   Procedure: LEFT HEART CATH AND CORS/GRAFTS ANGIOGRAPHY;  Surgeon: Dionisio David, MD;  Location: [place] [facility];  Service: Cardiovascular;  Laterality: N/A;    Allergies: Allergies as of 07/25/2020 - Review Complete 07/25/2020  Allergen Reaction Noted  . Morphine and related Hives 05/29/2013  . Levaquin [levofloxacin] Itching 04/21/2018  . Other  04/18/2020  . Latex Rash 05/29/2013  . Tape Rash 12/04/2013    Medications:  Current Facility-Administered Medications:  .  acetaminophen (TYLENOL) tablet 325 mg, 325 mg, Oral, Q6H PRN **OR** acetaminophen (TYLENOL) suppository 325 mg, 325 mg, Rectal, Q6H PRN, Cox, Amy N, DO .  carvedilol (COREG) tablet 12.5 mg, 12.5 mg, Oral, BID WC, Cox, Amy N, DO .  cinacalcet (SENSIPAR) tablet 60 mg, 60 mg, Oral, QPM, Cox, Amy N, DO .  hydrOXYzine (ATARAX/VISTARIL) tablet 50 mg, 50 mg, Oral, BID PRN, Cox, Amy N, DO .  isosorbide mononitrate (IMDUR) 24 hr tablet 60 mg, 60 mg, Oral, Daily, Cox, Amy N, DO .  [START ON 07/26/2020] levothyroxine (SYNTHROID) tablet 75 mcg, 75 mcg, Oral, QAC breakfast, Cox, Amy N, DO .  midodrine (PROAMATINE) tablet 10-20 mg, 10-20 mg, Oral, PRN, Cox, Amy N, DO .  ondansetron (ZOFRAN) tablet 4 mg, 4 mg, Oral, Q6H PRN **OR** ondansetron (ZOFRAN) injection 4 mg, 4 mg, Intravenous, Q6H PRN, Cox, Amy N, DO .  oxyCODONE-acetaminophen (PERCOCET/ROXICET) 5-325 MG per tablet 1 tablet, 1 tablet, Oral, BID PRN, Cox, Amy N, DO, 1 tablet at 07/25/20 1445 .  pantoprazole (PROTONIX) EC tablet 40 mg, 40 mg, Oral, Daily, Cox, Amy N, DO .  ranolazine (RANEXA) 12 hr tablet 500 mg, 500 mg, Oral, BID, Cox, Amy N, DO .  sacubitril-valsartan (ENTRESTO) 49-51 mg per tablet, 1 tablet, Oral, BID, Cox, Amy N, DO .  sevelamer carbonate (RENVELA) tablet 2,400 mg, 2,400 mg, Oral, TID WC, Cox, Amy N, DO .  sevelamer carbonate (RENVELA) tablet 800 mg, 800 mg, Oral, PRN, Cox,  Amy N, DO .  sodium chloride flush (NS) 0.9 % injection 3 mL, 3 mL, Intravenous, Q12H, Cox, Amy N, DO, 3 mL at 07/25/20 1442  Current Outpatient Medications:  .  aspirin 81 MG chewable tablet, Chew 81 mg by mouth daily., Disp: , Rfl:  .  carvedilol (COREG) 12.5 MG tablet, Take 12.5 mg by mouth 2 (two) times daily with a meal., Disp: , Rfl:  .  cinacalcet (SENSIPAR) 60 MG tablet, Take 60 mg by mouth every evening. , Disp: , Rfl:  .  ELIQUIS 5 MG TABS tablet, Take 1 tablet (5 mg total) by mouth 2 (two) times daily., Disp: 60 tablet, Rfl: 0 .  hydrOXYzine (ATARAX/VISTARIL) 50 MG tablet, Take 50 mg by mouth 2 (two) times daily as needed for itching. , Disp: , Rfl:  .  isosorbide mononitrate (IMDUR) 60 MG 24 hr tablet, Take 60 mg by mouth daily. , Disp: , Rfl:  .  levothyroxine (SYNTHROID) 75 MCG tablet, Take 75 mcg by mouth daily before breakfast. , Disp: , Rfl:  .  lidocaine-prilocaine (EMLA) cream, APPLY TO ACCESS SITE 30 MINUTES PRIOR TO DIALYSIS, Disp: , Rfl:  .  midodrine (PROAMATINE) 10 MG tablet, Take 10-20 mg by mouth daily. (for low BP associated with dialysis), Disp: , Rfl:  .  oxyCODONE-acetaminophen (PERCOCET/ROXICET) 5-325 MG tablet, Take 1 tablet by mouth 2 (two) times daily as needed for moderate pain. , Disp: , Rfl:  .  pantoprazole (PROTONIX) 40 MG tablet, Take 40 mg by mouth daily., Disp: , Rfl:  .  ranolazine (RANEXA) 500 MG 12 hr tablet, Take 500 mg by mouth 2 (two) times daily., Disp: , Rfl:  .  sacubitril-valsartan (ENTRESTO) 49-51 MG, Take 1 tablet by mouth 2 (two) times daily. , Disp: , Rfl:  .  sevelamer carbonate (RENVELA) 800 MG tablet, Take 2,400 mg by mouth 3 (three) times daily with meals. And 1 tablet with snacks, Disp: , Rfl:  .  ZOFRAN 4 MG tablet, Take 4 mg by mouth every 4 (four) hours as needed for nausea/vomiting., Disp: , Rfl:   Facility-Administered Medications Ordered in Other Encounters:  .  sodium chloride flush (NS) 0.9 % injection 3 mL, 3 mL, Intravenous,  Q12H, Neoma Laming A, MD, 350 mL at 10/06/19 1212   Social History: Social History   Tobacco Use  . Smoking status: Current Some Day Smoker    Packs/day: 0.10    Years: 20.00    Pack years: 2.00    Types: Cigarettes  . Smokeless tobacco: Never Used  . Tobacco comment: smokes one a day  Vaping Use  . Vaping Use: Never used  Substance Use Topics  . Alcohol use: No  . Drug use: No    Family Medical History: Family History  Problem Relation Age of Onset  . Hypertension Other   . Cancer Other   . Renal Disease Other   .  Ovarian cancer Mother   . Kidney disease Father   . Breast cancer Maternal Aunt   . Breast cancer Maternal Grandmother     Physical Examination: Vitals:   07/25/20 1500 07/25/20 1530  BP: 101/80 106/62  Pulse: 74 79  Resp: 15 18  Temp:    SpO2: 94% 92%     General: Patient is well developed, well nourished, calm, collected, and in no apparent distress.  Psychiatric: Patient is non-anxious.  Head:  Pupils equal, round, and reactive to light.  ENT:  Oral mucosa appears well hydrated.  Neck:   Supple.  Full range of motion.  Respiratory: Patient is breathing without any difficulty.  Extremities: No edema.  Vascular: Palpable pulses in dorsal pedal vessels.  Skin:   On exposed skin, there are no abnormal skin lesions. L fistula present.  NEUROLOGICAL:  General: In no acute distress.   Awake, alert, oriented to person, place, and time.  Pupils equal round and reactive to light.  Facial tone is symmetric.  Tongue protrusion is midline.  There is no pronator drift.  ROM of spine: full.   Strength: Side Biceps Triceps Deltoid Interossei Grip Wrist Ext. Wrist Flex.  R 5 5 5 5 5 5 5   L 5 5 5 5 5 5 5    Side Iliopsoas Quads Hamstring PF DF EHL  R 5 5 5 5 5 5   L 5 5 5 5 5 5    Reflexes are 1+ and symmetric at the biceps, triceps, brachioradialis, patella and achilles.   Bilateral upper and lower extremity sensation is intact to light touch.  Gait is untested.  Hoffman's is absent.  Imaging: CT Head 07/25/20  IMPRESSION: Thin acute subdural hematoma overlying the right cerebral convexity, measuring 3 mm in greatest thickness. No significant mass effect. No midline shift.  Right parietal scalp hematoma.  Stable generalized atrophy of the brain, advanced chronic small vessel ischemic disease and remote cerebral and cerebellar infarcts.   Electronically Signed   By: Kellie Simmering DO   On: 07/25/2020 12:59  I have personally reviewed the images and agree with the above interpretation.  Labs: CBC Latest Ref Rng & Units 07/25/2020 02/06/2020 02/23/2019  WBC 4.0 - 10.5 K/uL 3.3(L) 5.0 -  Hemoglobin 12.0 - 15.0 g/dL 12.2 10.3(L) 13.3  Hematocrit 36.0 - 46.0 % 38.3 31.8(L) 39.0  Platelets 150 - 400 K/uL 152 114(L) -       Assessment and Plan: Ms. Eckersley is a pleasant 60 y.o. female with traumatic subdural hematoma, with GCS 15.  - Reversal of eliquis currently in process.  Hold eliquis for 2 weeks. - repeat HCT at 6 pm. - If HCT is stable, no need for further imaging. - No emergent neurosurgical intervention at the moment. - Would hold on AEDs given scant ICH. - We will follow up as outpatient. - Syncope workup per medicine   Marlaina Coburn K. Izora Ribas MD, [place] Dept. of Neurosurgery

## 2020-07-25 NOTE — H&P (Addendum)
History and Physical   [name] CXK:[id number] DOB: May 06, 1961 DOA: 07/25/2020  PCP: [name], MD  Outpatient Specialists: Cardiovascular, Dr. Lucky Cowboy Patient coming from: home  I have personally briefly reviewed patient's old medical records in [place].  Chief Concern: Syncope  HPI: [name] is a 60 y.o. female with medical history significant for end-stage renal disease secondary to poorly controlled hypertension, CVA secondary to poorly controlled hypertension, hypertension now normotensive, atrial fibrillation on Eliquis 5 mg twice daily and amiodarone 200 mg twice daily, exhausted follow-up access will hepatitis C, liver cirrhosis with ascites, grams daily pericardial effusion, sternal wound dehiscence, delayed surgical wound healing, anxiety/depression, chronic pain, presents to the emergency department for chief concerns of syncope.  She was walking outside to feed the cats and standing by the [facility] unit, and she fell down and lost consciousness.  She does not know how long she was out.  She endorses head trauma.  She reports this is never happened before.  She denies tripping on anything. She denies chest pain and shortness of breath. She endorses feeling lightheaded prior to passing out.   She is anuric. She endorses nausea and vomiting once after she came to.  She denies vomiting since being in the emergency department.  She endorses mild nausea.  Social history: She lives by herself. She is disabled and formerly worked as a Educational psychologist. infrequent use of tobacco. She infrequently drinks etoh, and last drink was 6 months ago. She denies recreational drug use.   ROS: Constitutional: no weight change, no fever ENT/Mouth: no sore throat, no rhinorrhea Eyes: no eye pain, no vision changes Cardiovascular: no chest pain, no dyspnea,  no edema, no palpitations Respiratory: no cough, no sputum, no wheezing Gastrointestinal: + nausea, no vomiting, no diarrhea, no  constipation Genitourinary: no urinary incontinence, no dysuria, no hematuria Musculoskeletal: no arthralgias, no myalgias Skin: no skin lesions, no pruritus, Neuro: + weakness, + loss of consciousness, + syncope Psych: no anxiety, no depression, + decrease appetite Heme/Lymph: no bruising, no bleeding  ED Course: Discussed with ED provider, patient requiring hospitalization due to syncope and loss of consciousness with subdural hematoma.  Assessment/Plan  Principal Problem:   Syncope and collapse Active Problems:   Generalized weakness   HTN (hypertension)   S/P CABG x 3   ESRD (end stage renal disease) on dialysis (HCC)   CAD, multiple vessel   Anemia of chronic renal failure   Subdural hematoma (HCC)   Syncope and collapse-multifactorial and possible hypotension -Orthostatic vital signs recheck nursing review ordered  -Complete echo of the heart ordered -Ultrasound of bilateral carotids -Given heart failure with reduced ejection fraction on echo in 2016, I have not held Coreg or Imdur or Entresto despite presentation of syncope -Would recommend a.m. team to determine whether she would benefit from decrease dose of Entresto and may require cardiology consultation pending new echo result and hospital admission for syncope  Subdural hematoma - neurosurgery Dr. Cari Caraway at [facility] has been consulted  - Dr. Cari Caraway recommends to repeat CT of the head approximately 6 hours after initial CT imaging -Repeat CT of the head was negative for changes -Dr. Cari Caraway recommends holding Eliquis for 2 weeks, no emergent neurosurgical intervention at the moment, will hold on any antiepileptic drugs given scant [place] -Neurosurgery will follow up with patient outpatient  HFrEF-resume carvedilol 12.5 mg twice daily, isosorbide mononitrate 60 mg daily - Entresto 59-51 mg twice daily for 07/26/20  Hypothyroid-levothyroxine 75 mcg daily before breakfast  End-stage renal disease-nephrology has  been consulted routinely via secure chat -Patient's last hemodialysis session was 07/24/2020 and she endorses compliance with hemodialysis -Sensipar 60 mg daily resumed and sevelamer carbonate 800 mg p.o. daily resumed  Right parietal scalp hematoma - secondary to fall and syncope  - Present on admission -No active skin bleeding at this time  Chart reviewed.   History of cirrhosis of the liver with ascites suspect secondary to hepatitis C treated at [facility] with [place] in 2019 and has achieved sustained virologic response   DVT prophylaxis: TED hose Code Status: Full code Diet: Heart healthy/renal Family Communication: Updated brother at bedside Disposition Plan: Pending clinical course Consults called: Neurosurgery Admission status: Observation with telemetry to progressive cardiac  Past Medical History:  Diagnosis Date  . Anemia    chronic disease  . CHF (congestive heart failure) ([place])   . Coronary artery disease   . Heart failure ([place])   . Hepatitis    history of hep c  . Hyperlipidemia   . Hypertension   . Myocardial infarction ([place])   . Paroxysmal atrial fibrillation (HCC)   . Renal failure   . Renal insufficiency   . Stroke [facility]) 2011   Past Surgical History:  Procedure Laterality Date  . A/V FISTULAGRAM Left 03/30/2019   Procedure: A/V FISTULAGRAM;  Surgeon: Algernon Huxley, MD;  Location: [place] [facility];  Service: Cardiovascular;  Laterality: Left;  . A/V FISTULAGRAM Left 10/06/2019   Procedure: A/V FISTULAGRAM;  Surgeon: Algernon Huxley, MD;  Location: [place] [facility];  Service: Cardiovascular;  Laterality: Left;  . CORONARY ANGIOPLASTY WITH STENT PLACEMENT  2013  . CORONARY ARTERY BYPASS GRAFT    . DIALYSIS FISTULA CREATION    . FLEXIBLE SIGMOIDOSCOPY N/A 02/23/2019   Procedure: FLEXIBLE SIGMOIDOSCOPY;  Surgeon: Lollie Sails, MD;  Location: [facility] ENDOSCOPY;  Service: Endoscopy;  Laterality: N/A;  . LEFT HEART CATH AND CORS/GRAFTS ANGIOGRAPHY N/A 08/08/2018    Procedure: LEFT HEART CATH AND CORS/GRAFTS ANGIOGRAPHY;  Surgeon: Dionisio David, MD;  Location: [place] [facility];  Service: Cardiovascular;  Laterality: N/A;  . LEFT HEART CATH AND CORS/GRAFTS ANGIOGRAPHY N/A 01/30/2019   Procedure: LEFT HEART CATH AND CORS/GRAFTS ANGIOGRAPHY;  Surgeon: Dionisio David, MD;  Location: [place] [facility];  Service: Cardiovascular;  Laterality: N/A;   Social History:  reports that she has been smoking cigarettes. She has a 2.00 pack-year smoking history. She has never used smokeless tobacco. She reports that she does not drink alcohol and does not use drugs.  Allergies  Allergen Reactions  . Morphine And Related Hives  . Levaquin [Levofloxacin] Itching    Severe itching; prickly sensation  . Other   . Latex Rash  . Tape Rash   Family History  Problem Relation Age of Onset  . Hypertension Other   . Cancer Other   . Renal Disease Other   . Ovarian cancer Mother   . Kidney disease Father   . Breast cancer Maternal Aunt   . Breast cancer Maternal Grandmother    Family history: Family history reviewed and not pertinent  Prior to Admission medications   Medication Sig Start Date End Date Taking? Authorizing Provider  amiodarone (PACERONE) 200 MG tablet Take 200 mg by mouth 2 (two) times daily. 01/10/19   [provider]  aspirin 81 MG chewable tablet Chew 81 mg by mouth daily.    [provider]  carvedilol (COREG) 12.5 MG tablet Take 12.5 mg by mouth  2 (two) times daily with a meal.    [provider]  cinacalcet (SENSIPAR) 60 MG tablet Take 60 mg by mouth every evening.     [provider]  diclofenac sodium (VOLTAREN) 1 % GEL Apply 4 g topically 4 (four) times daily. Patient not taking: Reported on 03/30/2019 12/22/18   Schuman, Stefanie Libel, MD  ELIQUIS 5 MG TABS tablet Take 1 tablet (5 mg total) by mouth 2 (two) times daily. 02/02/18   Mayo, Pete Pelt, MD  hydrOXYzine (ATARAX/VISTARIL) 50 MG tablet Take 50 mg  by mouth 2 (two) times daily as needed for itching.     [provider]  isosorbide mononitrate (IMDUR) 60 MG 24 hr tablet Take 60 mg by mouth daily.  09/14/18   [provider]  levothyroxine (SYNTHROID) 75 MCG tablet Take 75 mcg by mouth daily before breakfast.  11/02/18   [provider]  lidocaine-prilocaine (EMLA) cream APPLY TO ACCESS SITE 30 MINUTES PRIOR TO DIALYSIS 04/08/19   [provider]  midodrine (PROAMATINE) 10 MG tablet Take 10-20 mg by mouth daily. (for low BP associated with dialysis)    [provider]  oxyCODONE-acetaminophen (PERCOCET/ROXICET) 5-325 MG tablet Take 1 tablet by mouth 2 (two) times daily as needed for moderate pain.  12/08/18   [provider]  pantoprazole (PROTONIX) 40 MG tablet Take 40 mg by mouth daily.    [provider]  ranolazine (RANEXA) 500 MG 12 hr tablet Take 500 mg by mouth 2 (two) times daily. 02/14/20   [provider]  rosuvastatin (CRESTOR) 20 MG tablet Take 20 mg by mouth at bedtime. 02/14/20   [provider]  sacubitril-valsartan (ENTRESTO) 49-51 MG Take 1 tablet by mouth 2 (two) times daily.  08/01/18   [provider]  sevelamer carbonate (RENVELA) 800 MG tablet Take 2,400 mg by mouth 3 (three) times daily with meals. And 1 tablet with snacks    [provider]  sucralfate (CARAFATE) 1 g tablet Take 1 g by mouth 2 (two) times daily.    [provider]   Physical Exam: Vitals:   07/25/20 1136 07/25/20 1200 07/25/20 1230 07/25/20 1400  BP:  95/67 95/75 (!) 108/58  Pulse: 77 75 79 73  Resp: 15 14 17 15   Temp:      TempSrc:      SpO2: 97% 99% 94% 96%  Weight:      Height:       Constitutional: appears younger than chronological age, NAD, calm, comfortable.  Eyes: PERRL, lids and conjunctivae normal HENMT: right parietal scalp hematoma present, Mucous membranes are moist. Posterior pharynx clear of any exudate or lesions. Age-appropriate  dentition. Hearing appropriate Neck: normal, supple, no masses, no thyromegaly Respiratory: clear to auscultation bilaterally, no wheezing, no crackles. Normal respiratory effort. No accessory muscle use.  Cardiovascular: Regular rate and rhythm, no murmurs / rubs / gallops. No extremity edema. 2+ pedal pulses. No carotid bruits. Left upper extremity AV access with appropriate bruit Abdomen: no tenderness, no masses palpated, no hepatosplenomegaly. Bowel sounds positive.  Musculoskeletal: no clubbing / cyanosis. No joint deformity upper and lower extremities. Good ROM, no contractures, no atrophy. Normal muscle tone.  Skin: no rashes, lesions, ulcers. No induration Neurologic: Sensation intact. Strength 5/5 in all 4.  Psychiatric: Normal judgment and insight. Alert and oriented x 3. Normal mood.   EKG: independently reviewed, showing sinus rhythm, rate of 78, first-degree AV block, right bundle branch block, QTC 542  Chest x-ray on  Admission: I personally reviewed and I agree with radiologist reading as below.  CT HEAD WO CONTRAST  Result Date: 07/25/2020 CLINICAL DATA:  Head trauma, moderate/severe, syncope with head injury. Dizziness and headache. EXAM: CT HEAD WITHOUT CONTRAST TECHNIQUE: Contiguous axial images were obtained from the base of the skull through the vertex without intravenous contrast. COMPARISON:  Prior head CT 02/06/2020. FINDINGS: Brain: Mild cerebral and cerebellar atrophy. Thin acute subdural hematoma overlying the right cerebral convexity measuring 3 mm in greatest thickness (series 4, image 25). Redemonstrated patchy small to moderate-sized chronic cortically based infarcts within the bilateral frontal, parietal and occipital lobes. Background advanced ill-defined hypoattenuation within the cerebral white matter, nonspecific but compatible with chronic small vessel ischemic disease. Redemonstrated small chronic right cerebellar infarct. No evidence of intracranial mass. No  midline shift. Vascular: No hyperdense vessel.  Atherosclerotic calcifications. Skull: Normal. Negative for fracture or focal lesion. Sinuses/Orbits: Visualized orbits show no acute finding. No significant paranasal sinus disease at the imaged levels. Other: Right parietal scalp hematoma. These results were called by telephone at the time of interpretation on 07/25/2020 at 12:59 pm to provider [facility] , who verbally acknowledged these results. IMPRESSION: Thin acute subdural hematoma overlying the right cerebral convexity, measuring 3 mm in greatest thickness. No significant mass effect. No midline shift. Right parietal scalp hematoma. Stable generalized atrophy of the brain, advanced chronic small vessel ischemic disease and remote cerebral and cerebellar infarcts. Electronically Signed   By: Kellie Simmering DO   On: 07/25/2020 12:59   Labs on Admission: I have personally reviewed following labs  CBC: Recent Labs  Lab 07/25/20 1121  WBC 3.3*  HGB 12.2  HCT 38.3  MCV 104.4*  PLT 093   Basic Metabolic Panel: Recent Labs  Lab 07/25/20 1121  NA 139  K 4.5  CL 99  CO2 26  GLUCOSE 101*  BUN 42*  CREATININE 6.49*  CALCIUM 8.1*   GFR: Estimated Creatinine Clearance: 9.8 mL/min (A) (by C-G formula based on SCr of 6.49 mg/dL (H)).  Urine analysis:    Component Value Date/Time   COLORURINE Yellow 09/14/2012 0414   APPEARANCEUR Cloudy 09/14/2012 0414   LABSPEC 1.018 09/14/2012 0414   PHURINE 9.0 09/14/2012 0414   GLUCOSEU 50 mg/dL 09/14/2012 0414   HGBUR 1+ 09/14/2012 0414   BILIRUBINUR Negative 09/14/2012 0414   KETONESUR Negative 09/14/2012 0414   PROTEINUR >=500 09/14/2012 0414   NITRITE Negative 09/14/2012 0414   LEUKOCYTESUR 2+ 09/14/2012 0414   Laszlo Ellerby N Devonta Blanford D.O. [facility]  If 7PM-7AM, please contact overnight-coverage provider If 7AM-7PM, please contact day coverage provider [email or web address]  07/25/2020, 3:28 PM

## 2020-07-25 NOTE — ED Provider Notes (Signed)
[facility] Emergency Department Provider Note  ____________________________________________   Event Date/Time   First MD Initiated Contact with Patient 07/25/20 1132     (approximate)  I have reviewed the triage vital signs and the nursing notes.   HISTORY  Chief Complaint Loss of Consciousness    HPI [name] is a 60 y.o. female with CHF, paroxysmal A. fib, ESRD who comes in for syncope.  Patient states that she was going outside when she had sudden onset of passing out.  She does not remember anything just waking up on the ground.  Denies any prodromal symptoms.  Reports having a syncopal episode 2 months ago but was in the setting of feeling lightheaded and she thought it was just from her low blood pressures.  She denies having a work-up for syncope recently.  Syncope occurred 1 time, unclear what brought it on, nothing made it better, nothing made it worse.  She denies any chest pain, shortness of breath, abdominal pain.  She was last dialyzed yesterday.  She did hit her head.  Positive LOC   Review of records patient's blood pressure typically runs in the 90s and she is on Midrin.          Past Medical History:  Diagnosis Date  . Anemia    chronic disease  . CHF (congestive heart failure) ([place])   . Coronary artery disease   . Heart failure ([place])   . Hepatitis    history of hep c  . Hyperlipidemia   . Hypertension   . Myocardial infarction ([place])   . Paroxysmal atrial fibrillation (HCC)   . Renal failure   . Renal insufficiency   . Stroke [facility]) 2011    Patient Active Problem List   Diagnosis Date Noted  . ESRD (end stage renal disease) ([place]) 01/30/2019  . Unstable angina ([place]) 01/23/2019  . Non-STEMI (non-ST elevated myocardial infarction) ([place]) 08/07/2018  . Pneumonia 01/31/2018  . Swelling of limb 09/22/2016  . Kidney dialysis as the cause of abnormal reaction of the patient, or of later complication, without mention of  misadventure at the time of the procedure (CODE) 03/24/2016  . Hyperkalemia 11/13/2014  . Generalized weakness 10/15/2014  . Chest pain 10/15/2014  . ESRD on hemodialysis ([place]) 10/15/2014  . Congestive heart failure (CHF) ([place]) 10/15/2014  . HTN (hypertension) 10/15/2014  . Sternal wound dehiscence 04/12/2014  . 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 Pericardial effusion 04/12/2014  . History of delirium 04/12/2014  . H/O atrial flutter 04/12/2014  . Delayed surgical wound healing 04/12/2014  . Chest wall pain following surgery 04/12/2014  . S/P CABG (coronary artery bypass graft) 04/02/2014  . Lactic acidosis 03/26/2014  . Arteriosclerotic dementia ([place]) 03/25/2014  . Atrial flutter by electrocardiogram ([place]) 03/22/2014  . Superficial incisional surgical site infection 03/14/2014  . Postoperative anemia due to acute blood loss 02/27/2014  . Obesity 02/27/2014  . H/O: substance abuse ([place]) 02/27/2014  . Anemia of chronic renal failure 02/27/2014  . Acute postoperative pain 02/27/2014  . Leukocytosis 02/26/2014  . Hypotension 02/24/2014  . Exhausted vascular access 02/24/2014  . Anemia of chronic disease 02/24/2014  . Stroke ([place]) 02/21/2014  . S/P CABG x 3 02/21/2014  . Dialysis patient ([place]) 02/21/2014  . Hyperlipidemia 02/13/2014  . History of hepatitis C 02/13/2014  . HFrEF (heart failure with reduced ejection fraction) ([place]) 02/13/2014  . H/O stroke without residual deficits 02/13/2014  . ESRD (end stage renal disease) on dialysis ([place]) 02/13/2014  .  End stage renal disease with hypertension ([place]) 02/13/2014  . CAD, multiple vessel 02/13/2014  . Retrosternal chest pain 05/30/2013  . Gallstones 05/29/2013    Past Surgical History:  Procedure Laterality Date  . A/V FISTULAGRAM Left 03/30/2019   Procedure: A/V FISTULAGRAM;  Surgeon: Algernon Huxley, MD;  Location: [place] [facility];  Service: Cardiovascular;  Laterality: Left;  . A/V FISTULAGRAM Left 10/06/2019    Procedure: A/V FISTULAGRAM;  Surgeon: Algernon Huxley, MD;  Location: [place] [facility];  Service: Cardiovascular;  Laterality: Left;  . CORONARY ANGIOPLASTY WITH STENT PLACEMENT  2013  . CORONARY ARTERY BYPASS GRAFT    . DIALYSIS FISTULA CREATION    . FLEXIBLE SIGMOIDOSCOPY N/A 02/23/2019   Procedure: FLEXIBLE SIGMOIDOSCOPY;  Surgeon: Lollie Sails, MD;  Location: [facility] ENDOSCOPY;  Service: Endoscopy;  Laterality: N/A;  . LEFT HEART CATH AND CORS/GRAFTS ANGIOGRAPHY N/A 08/08/2018   Procedure: LEFT HEART CATH AND CORS/GRAFTS ANGIOGRAPHY;  Surgeon: Dionisio David, MD;  Location: [place] [facility];  Service: Cardiovascular;  Laterality: N/A;  . LEFT HEART CATH AND CORS/GRAFTS ANGIOGRAPHY N/A 01/30/2019   Procedure: LEFT HEART CATH AND CORS/GRAFTS ANGIOGRAPHY;  Surgeon: Dionisio David, MD;  Location: [place] [facility];  Service: Cardiovascular;  Laterality: N/A;    Prior to Admission medications   Medication Sig Start Date End Date Taking? Authorizing Provider  amiodarone (PACERONE) 200 MG tablet Take 200 mg by mouth 2 (two) times daily. 01/10/19   [provider]  aspirin 81 MG chewable tablet Chew 81 mg by mouth daily.    [provider]  carvedilol (COREG) 12.5 MG tablet Take 12.5 mg by mouth 2 (two) times daily with a meal.    [provider]  cinacalcet (SENSIPAR) 60 MG tablet Take 60 mg by mouth every evening.     [provider]  diclofenac sodium (VOLTAREN) 1 % GEL Apply 4 g topically 4 (four) times daily. Patient not taking: Reported on 03/30/2019 12/22/18   Schuman, Stefanie Libel, MD  ELIQUIS 5 MG TABS tablet Take 1 tablet (5 mg total) by mouth 2 (two) times daily. 02/02/18   Mayo, Pete Pelt, MD  hydrOXYzine (ATARAX/VISTARIL) 50 MG tablet Take 50 mg by mouth 2 (two) times daily as needed for itching.     [provider]  isosorbide mononitrate (IMDUR) 60 MG 24 hr tablet Take 60 mg by mouth daily.  09/14/18   [provider]   levothyroxine (SYNTHROID) 75 MCG tablet Take 75 mcg by mouth daily before breakfast.  11/02/18   [provider]  lidocaine-prilocaine (EMLA) cream APPLY TO ACCESS SITE 30 MINUTES PRIOR TO DIALYSIS 04/08/19   [provider]  midodrine (PROAMATINE) 10 MG tablet Take 10-20 mg by mouth daily. (for low BP associated with dialysis)    [provider]  oxyCODONE-acetaminophen (PERCOCET/ROXICET) 5-325 MG tablet Take 1 tablet by mouth 2 (two) times daily as needed for moderate pain.  12/08/18   [provider]  pantoprazole (PROTONIX) 40 MG tablet Take 40 mg by mouth daily.    [provider]  ranolazine (RANEXA) 500 MG 12 hr tablet Take 500 mg by mouth 2 (two) times daily. 02/14/20   [provider]  rosuvastatin (CRESTOR) 20 MG tablet Take 20 mg by mouth at bedtime. 02/14/20   [provider]  sacubitril-valsartan (ENTRESTO) 49-51 MG Take 1 tablet by mouth 2 (two) times daily.  08/01/18   [provider]  sevelamer carbonate (RENVELA) 800 MG tablet  Take 2,400 mg by mouth 3 (three) times daily with meals. And 1 tablet with snacks    [provider]  sucralfate (CARAFATE) 1 g tablet Take 1 g by mouth 2 (two) times daily.    [provider]    Allergies Morphine and related, Levaquin [levofloxacin], Other, Latex, and Tape  Family History  Problem Relation Age of Onset  . Hypertension Other   . Cancer Other   . Renal Disease Other   . Ovarian cancer Mother   . Kidney disease Father   . Breast cancer Maternal Aunt   . Breast cancer Maternal Grandmother     Social History Social History   Tobacco Use  . Smoking status: Current Some Day Smoker    Packs/day: 0.10    Years: 20.00    Pack years: 2.00    Types: Cigarettes  . Smokeless tobacco: Never Used  . Tobacco comment: smokes one a day  Vaping Use  . Vaping Use: Never used  Substance Use Topics  . Alcohol use: No  . Drug use: No      Review of  Systems Constitutional: No fever/chills, syncope Eyes: No visual changes. ENT: No sore throat. Cardiovascular: Denies chest pain. Respiratory: Denies shortness of breath. Gastrointestinal: No abdominal pain.  No nausea, no vomiting.  No diarrhea.  No constipation. Genitourinary: Negative for dysuria. Musculoskeletal: Negative for back pain. Skin: Negative for rash. Neurological: Negative for headaches, focal weakness or numbness. All other ROS negative ____________________________________________   PHYSICAL EXAM:  VITAL SIGNS: ED Triage Vitals  Enc Vitals Group     BP 07/25/20 1117 (!) 88/51     Pulse Rate 07/25/20 1117 74     Resp --      Temp 07/25/20 1117 97.6 F (36.4 C)     Temp Source 07/25/20 1117 Oral     SpO2 --      Weight 07/25/20 1116 160 lb (72.6 kg)     Height 07/25/20 1116 5\' 6"  (1.676 m)     Head Circumference --      Peak Flow --      Pain Score 07/25/20 1116 8     Pain Loc --      Pain Edu? --      Excl. in [place]? --     Constitutional: Alert and oriented. Well appearing and in no acute distress. Eyes: Conjunctivae are normal. EOMI. Head: Atraumatic. Nose: No congestion/rhinnorhea.  Hematoma on the back of the head Mouth/Throat: Mucous membranes are moist.   Neck: No stridor. Trachea Midline. FROM Cardiovascular: Normal rate, regular rhythm. Grossly normal heart sounds.  Good peripheral circulation. Respiratory: Normal respiratory effort.  No retractions. Lungs CTAB. Gastrointestinal: Soft and nontender. No distention. No abdominal bruits.  Musculoskeletal: No lower extremity tenderness nor edema.  No joint effusions.  Left arm fistula Neurologic:  Normal speech and language. No gross focal neurologic deficits are appreciated.  Skin:  Skin is warm, dry and intact. No rash noted. Psychiatric: Mood and affect are normal. Speech and behavior are normal. GU: Deferred   ____________________________________________   LABS (all labs ordered are listed,  but only abnormal results are displayed)  Labs Reviewed  BASIC METABOLIC PANEL - Abnormal; Notable for the following components:      Result Value   Glucose, Bld 101 (*)    BUN 42 (*)    Creatinine, Ser 6.49 (*)    Calcium 8.1 (*)    GFR, Estimated 7 (*)    All other components  within normal limits  CBC - Abnormal; Notable for the following components:   WBC 3.3 (*)    RBC 3.67 (*)    MCV 104.4 (*)    All other components within normal limits  RESP PANEL BY RT-PCR (FLU A&B, COVID) ARPGX2  URINALYSIS, COMPLETE (UACMP) WITH MICROSCOPIC  CBG MONITORING, ED   ____________________________________________   ED ECG REPORT I, Vanessa Arjay, the attending physician, personally viewed and interpreted this ECG.  Sinus with first-degree AV block rate of 78, no ST elevation, no T wave inversions, right bundle branch block.  Looks similar to prior ____________________________________________  RADIOLOGY Robert Bellow, personally viewed and evaluated these images (plain radiographs) as part of my medical decision making, as well as reviewing the written report by the radiologist.  ED MD interpretation: Small subdural  Official radiology report(s): CT HEAD WO CONTRAST  Result Date: 07/25/2020 CLINICAL DATA:  Head trauma, moderate/severe, syncope with head injury. Dizziness and headache. EXAM: CT HEAD WITHOUT CONTRAST TECHNIQUE: Contiguous axial images were obtained from the base of the skull through the vertex without intravenous contrast. COMPARISON:  Prior head CT 02/06/2020. FINDINGS: Brain: Mild cerebral and cerebellar atrophy. Thin acute subdural hematoma overlying the right cerebral convexity measuring 3 mm in greatest thickness (series 4, image 25). Redemonstrated patchy small to moderate-sized chronic cortically based infarcts within the bilateral frontal, parietal and occipital lobes. Background advanced ill-defined hypoattenuation within the cerebral white matter, nonspecific but  compatible with chronic small vessel ischemic disease. Redemonstrated small chronic right cerebellar infarct. No evidence of intracranial mass. No midline shift. Vascular: No hyperdense vessel.  Atherosclerotic calcifications. Skull: Normal. Negative for fracture or focal lesion. Sinuses/Orbits: Visualized orbits show no acute finding. No significant paranasal sinus disease at the imaged levels. Other: Right parietal scalp hematoma. These results were called by telephone at the time of interpretation on 07/25/2020 at 12:59 pm to provider [facility] [place] , who verbally acknowledged these results. IMPRESSION: Thin acute subdural hematoma overlying the right cerebral convexity, measuring 3 mm in greatest thickness. No significant mass effect. No midline shift. Right parietal scalp hematoma. Stable generalized atrophy of the brain, advanced chronic small vessel ischemic disease and remote cerebral and cerebellar infarcts. Electronically Signed   By: Kellie Simmering DO   On: 07/25/2020 12:59    ____________________________________________   PROCEDURES  Procedure(s) performed (including Critical Care):  .Critical Care Performed by: Vanessa Holland, MD Authorized by: Vanessa [place], MD   Critical care provider statement:    Critical care time (minutes):  45   Critical care was necessary to treat or prevent imminent or life-threatening deterioration of the following conditions:  CNS failure or compromise   Critical care was time spent personally by me on the following activities:  Discussions with consultants, evaluation of patient's response to treatment, examination of patient, ordering and performing treatments and interventions, ordering and review of laboratory studies, ordering and review of radiographic studies, pulse oximetry, re-evaluation of patient's condition, obtaining history from patient or surrogate and review of old charts     ____________________________________________   INITIAL IMPRESSION /  [place] / ED COURSE  [name] was evaluated in Emergency Department on 07/25/2020 for the symptoms described in the history of present illness. She was evaluated in the context of the global COVID-19 pandemic, which necessitated consideration that the patient might be at risk for infection with the SARS-CoV-2 virus that causes COVID-19. Institutional protocols and algorithms that pertain to the evaluation of patients at  risk for COVID-19 are in a state of rapid change based on information released by regulatory bodies including the CDC and federal and state organizations. These policies and algorithms were followed during the patient's care in the ED.     Patient is a 60 year old who comes in with syncope.  Patient denies any prodromal..  This makes it less likely to be secondary to hypotension and will concern for potential cardiac causes.  We will keep patient on the cardiac monitor.  No recent echo.  Will get labs to evaluate for Electra abnormalities.  Will get CT head evaluate for intercranial hemorrhage.  No cervical pain to suggest cervical injury so can hold off on CT for that.   Patient's blood pressure is around where it normally stands.  Given patient is a dialysis patient will hold off on fluid given her her dose of midodrine..    Labs are reassuring.  No signs of hyperkalemia.  Blood pressure better after the midodrine.  CT head concerning for subdural hematoma.  Discussed with Dr. Cari Caraway who recommends repeat CT head of reversing the Eliquis.  Discussed with pharmacy and we do have adnexa.  Given a dose to help reverse.  Will discuss possible team for admission due to the concern for syncope and for repeat CT head but this time patient is hemodynamically stable and mentating very well and neuro intact.           ____________________________________________   FINAL CLINICAL IMPRESSION(S) / ED DIAGNOSES   Final diagnoses:  Subdural hematoma (HCC)   Syncope and collapse      MEDICATIONS GIVEN DURING THIS VISIT:  Medications  oxyCODONE-acetaminophen (PERCOCET/ROXICET) 5-325 MG per tablet 1 tablet (1 tablet Oral Given 07/25/20 1445)  sodium chloride flush (NS) 0.9 % injection 3 mL (3 mLs Intravenous Given 07/25/20 1442)  acetaminophen (TYLENOL) tablet 325 mg (has no administration in time range)    Or  acetaminophen (TYLENOL) suppository 325 mg (has no administration in time range)  ondansetron (ZOFRAN) tablet 4 mg (has no administration in time range)    Or  ondansetron (ZOFRAN) injection 4 mg (has no administration in time range)  carvedilol (COREG) tablet 12.5 mg (has no administration in time range)  isosorbide mononitrate (IMDUR) 24 hr tablet 60 mg (has no administration in time range)  midodrine (PROAMATINE) tablet 10-20 mg (has no administration in time range)  ranolazine (RANEXA) 12 hr tablet 500 mg (has no administration in time range)  sacubitril-valsartan (ENTRESTO) 49-51 mg per tablet (has no administration in time range)  hydrOXYzine (ATARAX/VISTARIL) tablet 50 mg (has no administration in time range)  cinacalcet (SENSIPAR) tablet 60 mg (has no administration in time range)  levothyroxine (SYNTHROID) tablet 75 mcg (has no administration in time range)  pantoprazole (PROTONIX) EC tablet 40 mg (has no administration in time range)  sevelamer carbonate (RENVELA) tablet 2,400 mg (has no administration in time range)  midodrine (PROAMATINE) tablet 10 mg (10 mg Oral Given 07/25/20 1228)  coag fact Xa recombinant (ANDEXXA) low dose infusion 900 mg (900 mg Intravenous New Bag/Given 07/25/20 1442)     ED Discharge Orders    None       Note:  This document was prepared using Dragon voice recognition software and may include unintentional dictation errors.   Vanessa [place], MD 07/25/20 [phone]

## 2020-07-25 NOTE — ED Triage Notes (Signed)
Pt comes via EMS from home with c/o syncopal episode and found unconscious by neighbor. Pt hit her head. Pt on blood thinners. Pt has hematoma on back of head.  EMS reports hx of MI, dialysis.  BP-97/64, CBG-162, EKG unremarkable occ PVC

## 2020-07-25 NOTE — ED Notes (Signed)
Pt given meal tray at this time 

## 2020-07-26 ENCOUNTER — Observation Stay
Admit: 2020-07-26 | Discharge: 2020-07-26 | Disposition: A | Discharge status: Home / Self Care | Payer: Medicare Other | Attending: Internal Medicine | Admitting: Internal Medicine

## 2020-07-26 DIAGNOSIS — S065X9A Traumatic subdural hemorrhage with loss of consciousness of unspecified duration, initial encounter: Secondary | ICD-10-CM | POA: Diagnosis present

## 2020-07-26 DIAGNOSIS — Z20822 Contact with and (suspected) exposure to covid-19: Secondary | ICD-10-CM | POA: Diagnosis present

## 2020-07-26 DIAGNOSIS — E039 Hypothyroidism, unspecified: Secondary | ICD-10-CM | POA: Diagnosis present

## 2020-07-26 DIAGNOSIS — R55 Syncope and collapse: Secondary | ICD-10-CM

## 2020-07-26 DIAGNOSIS — B1920 Unspecified viral hepatitis C without hepatic coma: Secondary | ICD-10-CM | POA: Diagnosis present

## 2020-07-26 DIAGNOSIS — F17210 Nicotine dependence, cigarettes, uncomplicated: Secondary | ICD-10-CM | POA: Diagnosis present

## 2020-07-26 DIAGNOSIS — I4892 Unspecified atrial flutter: Secondary | ICD-10-CM | POA: Diagnosis present

## 2020-07-26 DIAGNOSIS — R188 Other ascites: Secondary | ICD-10-CM | POA: Diagnosis present

## 2020-07-26 DIAGNOSIS — I313 Pericardial effusion (noninflammatory): Secondary | ICD-10-CM | POA: Diagnosis present

## 2020-07-26 DIAGNOSIS — F419 Anxiety disorder, unspecified: Secondary | ICD-10-CM | POA: Diagnosis present

## 2020-07-26 DIAGNOSIS — E785 Hyperlipidemia, unspecified: Secondary | ICD-10-CM | POA: Diagnosis present

## 2020-07-26 DIAGNOSIS — N186 End stage renal disease: Secondary | ICD-10-CM

## 2020-07-26 DIAGNOSIS — I132 Hypertensive heart and chronic kidney disease with heart failure and with stage 5 chronic kidney disease, or end stage renal disease: Secondary | ICD-10-CM | POA: Diagnosis present

## 2020-07-26 DIAGNOSIS — Z992 Dependence on renal dialysis: Secondary | ICD-10-CM

## 2020-07-26 DIAGNOSIS — I2510 Atherosclerotic heart disease of native coronary artery without angina pectoris: Secondary | ICD-10-CM | POA: Diagnosis present

## 2020-07-26 DIAGNOSIS — I420 Dilated cardiomyopathy: Secondary | ICD-10-CM | POA: Diagnosis present

## 2020-07-26 DIAGNOSIS — G8929 Other chronic pain: Secondary | ICD-10-CM | POA: Diagnosis present

## 2020-07-26 DIAGNOSIS — Y92019 Unspecified place in single-family (private) house as the place of occurrence of the external cause: Secondary | ICD-10-CM | POA: Diagnosis not present

## 2020-07-26 DIAGNOSIS — R531 Weakness: Secondary | ICD-10-CM

## 2020-07-26 DIAGNOSIS — F32A Depression, unspecified: Secondary | ICD-10-CM | POA: Diagnosis present

## 2020-07-26 DIAGNOSIS — I480 Paroxysmal atrial fibrillation: Secondary | ICD-10-CM | POA: Diagnosis present

## 2020-07-26 DIAGNOSIS — I5022 Chronic systolic (congestive) heart failure: Secondary | ICD-10-CM | POA: Diagnosis present

## 2020-07-26 DIAGNOSIS — D631 Anemia in chronic kidney disease: Secondary | ICD-10-CM | POA: Diagnosis present

## 2020-07-26 DIAGNOSIS — K7460 Unspecified cirrhosis of liver: Secondary | ICD-10-CM | POA: Diagnosis present

## 2020-07-26 DIAGNOSIS — I252 Old myocardial infarction: Secondary | ICD-10-CM | POA: Diagnosis not present

## 2020-07-26 DIAGNOSIS — W19XXXA Unspecified fall, initial encounter: Secondary | ICD-10-CM | POA: Diagnosis present

## 2020-07-26 DIAGNOSIS — I959 Hypotension, unspecified: Secondary | ICD-10-CM | POA: Diagnosis present

## 2020-07-26 LAB — BASIC METABOLIC PANEL
Anion gap: 14 (ref 5–15)
BUN: 56 mg/dL — ABNORMAL HIGH (ref 6–20)
CO2: 23 mmol/L (ref 22–32)
Calcium: 7 mg/dL — ABNORMAL LOW (ref 8.9–10.3)
Chloride: 102 mmol/L (ref 98–111)
Creatinine, Ser: 7.75 mg/dL — ABNORMAL HIGH (ref 0.44–1.00)
GFR, Estimated: 6 mL/min — ABNORMAL LOW (ref >60)
Glucose, Bld: 95 mg/dL (ref 70–99)
Potassium: 4 mmol/L (ref 3.5–5.1)
Sodium: 139 mmol/L (ref 135–145)

## 2020-07-26 LAB — ECHOCARDIOGRAM COMPLETE
AR max vel: 2.18 cm2
AV Area VTI: 2.08 cm2
AV Area mean vel: 2.13 cm2
AV Mean grad: 4 mmHg
AV Peak grad: 7.1 mmHg
Ao pk vel: 1.33 m/s
Area-P 1/2: 5.23 cm2
Calc EF: 35.7 %
Height: 66 in
MV VTI: 1.96 cm2
S' Lateral: 5.3 cm
Single Plane A2C EF: 31 %
Single Plane A4C EF: 39.2 %
Weight: 2560 oz

## 2020-07-26 LAB — HIV ANTIBODY (ROUTINE TESTING W REFLEX): HIV Screen 4th Generation wRfx: NONREACTIVE

## 2020-07-26 LAB — CBC
HCT: 32.5 % — ABNORMAL LOW (ref 36.0–46.0)
Hemoglobin: 10.9 g/dL — ABNORMAL LOW (ref 12.0–15.0)
MCH: 34 pg (ref 26.0–34.0)
MCHC: 33.5 g/dL (ref 30.0–36.0)
MCV: 101.2 fL — ABNORMAL HIGH (ref 80.0–100.0)
Platelets: 133 10*3/uL — ABNORMAL LOW (ref 150–400)
RBC: 3.21 MIL/uL — ABNORMAL LOW (ref 3.87–5.11)
RDW: 13 % (ref 11.5–15.5)
WBC: 3.7 10*3/uL — ABNORMAL LOW (ref 4.0–10.5)
nRBC: 0 % (ref 0.0–0.2)

## 2020-07-26 LAB — T4, FREE: Free T4: 0.82 ng/dL (ref 0.61–1.12)

## 2020-07-26 LAB — HEPATITIS B SURFACE ANTIGEN: Hepatitis B Surface Ag: NONREACTIVE

## 2020-07-26 LAB — PHOSPHORUS: Phosphorus: 4 mg/dL (ref 2.5–4.6)

## 2020-07-26 LAB — ALBUMIN: Albumin: 3.1 g/dL — ABNORMAL LOW (ref 3.5–5.0)

## 2020-07-26 MED ORDER — ALTEPLASE 2 MG IJ SOLR: 2.0000 mg | Freq: Once | INTRAMUSCULAR | Status: DC | PRN

## 2020-07-26 MED ORDER — LIDOCAINE HCL (PF) 1 % IJ SOLN
5.0000 mL | INTRAMUSCULAR | Status: DC | PRN
  Filled 2020-07-26: qty 5

## 2020-07-26 MED ORDER — ALBUMIN HUMAN 25 % IV SOLN
25.0000 g | Freq: Once | INTRAVENOUS | Status: DC | PRN
  Filled 2020-07-26: qty 100

## 2020-07-26 MED ORDER — LIDOCAINE-PRILOCAINE 2.5-2.5 % EX CREA
1.0000 "application " | TOPICAL_CREAM | CUTANEOUS | Status: DC | PRN
  Filled 2020-07-26: qty 5

## 2020-07-26 MED ORDER — PENTAFLUOROPROP-TETRAFLUOROETH EX AERO
1.0000 "application " | INHALATION_SPRAY | CUTANEOUS | Status: DC | PRN
  Filled 2020-07-26: qty 30

## 2020-07-26 MED ORDER — LIDOCAINE-PRILOCAINE 2.5-2.5 % EX CREA
TOPICAL_CREAM | Freq: Once | CUTANEOUS | Status: AC
  Filled 2020-07-26: qty 30
  Filled 2020-07-26: qty 5

## 2020-07-26 MED ORDER — HEPARIN SODIUM (PORCINE) 1000 UNIT/ML DIALYSIS
1000.0000 [IU] | INTRAMUSCULAR | Status: DC | PRN
  Filled 2020-07-26: qty 1

## 2020-07-26 MED ORDER — SODIUM CHLORIDE 0.9 % IV SOLN: 100.0000 mL | INTRAVENOUS | Status: DC | PRN

## 2020-07-26 MED ORDER — CHLORHEXIDINE GLUCONATE CLOTH 2 % EX PADS
6.0000 | MEDICATED_PAD | Freq: Every day | CUTANEOUS | Status: DC
  Filled 2020-07-26: qty 6

## 2020-07-26 NOTE — Progress Notes (Signed)
[place], [place] 07/26/20  Subjective:  [name] is a 60 y.o. AA female with PMHX of ESRD due to poorly controlled HTN, CVA, HTN, atrial fibrillation on Eliquis, Hep C, and liver cirrhosis with ascites who presents to the ED with syncope. She reports being in her backyard and feeling a little dizzy and passing out. She says she did loss consciousness and was found by a neighbor. She says this has never happened before.   She had her last HD treatment the day before and felt it went well.   Patient is seen today laying in bed receiving an ECHO. She is pleasant and social She denies pain, shortness of breath and chest pain She  Is able to eat.  States she is ready to go home  Last HD was 07/24/2020  Objective:  Vital signs in last 24 hours:  Temp:  [97.4 F (36.3 C)-97.9 F (36.6 C)] 97.4 F (36.3 C) (02/11 1152) Pulse Rate:  [66-87] 71 (02/11 1152) Resp:  [10-26] 17 (02/11 1152) BP: (70-131)/(31-99) 102/70 (02/11 1152) SpO2:  [88 %-100 %] 100 % (02/11 0931) Weight:  [72.3 kg] 72.3 kg (02/11 0931)  Weight change:  Filed Weights   07/25/20 1116 07/26/20 0931  Weight: 72.6 kg 72.3 kg    Intake/Output:   No intake or output data in the 24 hours ending 07/26/20 1342   Physical Exam: General: NAD, resting comfortable  HEENT Normocephalic, moist oral mucosa  Pulm/lungs Clear bilaterally  CVS/Heart S1-S2 present  Abdomen:  Soft, nontender  Extremities: No peripheral edema  Neurologic: Alert, oriented  Skin: No masses or rashes  Access: Left AVF       Basic Metabolic Panel:  Recent Labs  Lab 07/25/20 1121 07/26/20 0459  NA 139 139  K 4.5 4.0  CL 99 102  CO2 26 23  GLUCOSE 101* 95  BUN 42* 56*  CREATININE 6.49* 7.75*  CALCIUM 8.1* 7.0*     CBC: Recent Labs  Lab 07/25/20 1121 07/26/20 0459  WBC 3.3* 3.7*  HGB 12.2 10.9*  HCT 38.3 32.5*  MCV 104.4* 101.2*  PLT 152 133*      Lab Results  Component Value Date    HEPBSAG Negative 01/18/2019   HEPBSAB Non Reactive 01/18/2019   HEPBIGM Negative 01/18/2019      Microbiology:  Recent Results (from the past 240 hour(s))  Resp Panel by RT-PCR (Flu A&B, Covid) Nasopharyngeal Swab     Status: None   Collection Time: 07/25/20 12:29 PM   Specimen: Nasopharyngeal Swab; Nasopharyngeal(NP) swabs in vial transport medium  Result Value Ref Range Status   SARS Coronavirus 2 by RT PCR NEGATIVE NEGATIVE Final    Comment: (NOTE) SARS-CoV-2 target nucleic acids are NOT DETECTED.  The SARS-CoV-2 RNA is generally detectable in upper respiratory specimens during the acute phase of infection. The lowest concentration of SARS-CoV-2 viral copies this assay can detect is 138 copies/mL. A negative result does not preclude SARS-Cov-2 infection and should not be used as the sole basis for treatment or other patient management decisions. A negative result may occur with  improper specimen collection/handling, submission of specimen other than nasopharyngeal swab, presence of viral mutation(s) within the areas targeted by this assay, and inadequate number of viral copies(<138 copies/mL). A negative result must be combined with clinical observations, patient history, and epidemiological information. The expected result is Negative.  Fact Sheet for Patients:  EntrepreneurPulse.com.au  Fact Sheet for Healthcare Providers:  IncredibleEmployment.be  This test is no  t yet approved or cleared by the [place] and  has been authorized for detection and/or diagnosis of SARS-CoV-2 by FDA under an Emergency Use Authorization (EUA). This EUA will remain  in effect (meaning this test can be used) for the duration of the COVID-19 declaration under Section 564(b)(1) of the Act, 21 U.S.C.section 360bbb-3(b)(1), unless the authorization is terminated  or revoked sooner.       Influenza A by PCR NEGATIVE NEGATIVE Final   Influenza  B by PCR NEGATIVE NEGATIVE Final    Comment: (NOTE) The Xpert Xpress SARS-CoV-2/FLU/RSV plus assay is intended as an aid in the diagnosis of influenza from Nasopharyngeal swab specimens and should not be used as a sole basis for treatment. Nasal washings and aspirates are unacceptable for Xpert Xpress SARS-CoV-2/FLU/RSV testing.  Fact Sheet for Patients: EntrepreneurPulse.com.au  Fact Sheet for Healthcare Providers: IncredibleEmployment.be  This test is not yet approved or cleared by the [place] FDA and has been authorized for detection and/or diagnosis of SARS-CoV-2 by FDA under an Emergency Use Authorization (EUA). This EUA will remain in effect (meaning this test can be used) for the duration of the COVID-19 declaration under Section 564(b)(1) of the Act, 21 U.S.C. section 360bbb-3(b)(1), unless the authorization is terminated or revoked.  Performed at [facility], [address] [place], [place] [address]     Coagulation Studies: No results for input(s): LABPROT, INR in the last 72 hours.  Urinalysis: No results for input(s): COLORURINE, LABSPEC, PHURINE, GLUCOSEU, HGBUR, BILIRUBINUR, KETONESUR, PROTEINUR, UROBILINOGEN, NITRITE, LEUKOCYTESUR in the last 72 hours.  Invalid input(s): APPERANCEUR    Imaging: CT Head Wo Contrast  Result Date: 07/25/2020 CLINICAL DATA:  Syncopal episodes on unconscious by neighbors, head trauma patient has hematoma on back of head. EXAM: CT HEAD WITHOUT CONTRAST TECHNIQUE: Contiguous axial images were obtained from the base of the skull through the vertex without intravenous contrast. COMPARISON:  Head CT same day at 1242 hours FINDINGS: Brain: Unchanged size of the thin subdural hematoma overlying the right cerebral convexity measuring 2-3 mm in greatest thickness (series 4, image 29). No evidence of new intracranial hemorrhage. Mild global parenchymal volume loss and ex vacuo dilatation of  ventricular system. Again seen are patchy small to moderate-sized chronic cortically based infarcts in the bilateral frontal, parietal and occipital lobes. Similar burden of advanced chronic small-vessel white matter disease. Chronic right cerebellar infarct. Vascular: No hyperdense vessel.  Atherosclerotic calcification. Skull: Normal. Negative for fracture or focal lesion. Sinuses/Orbits: No acute finding. Other: Right parietal scalp hematoma measuring 4 mm, unchanged. IMPRESSION: 1. Unchanged size of the thin subdural hematoma overlying the right cerebral convexity measuring 2-3 mm in greatest thickness. No evidence of new intracranial hemorrhage. 2. Unchanged right parietal scalp hematoma. Electronically Signed   By: Dahlia Bailiff MD   On: 07/25/2020 19:14   CT HEAD WO CONTRAST  Result Date: 07/25/2020 CLINICAL DATA:  Head trauma, moderate/severe, syncope with head injury. Dizziness and headache. EXAM: CT HEAD WITHOUT CONTRAST TECHNIQUE: Contiguous axial images were obtained from the base of the skull through the vertex without intravenous contrast. COMPARISON:  Prior head CT 02/06/2020. FINDINGS: Brain: Mild cerebral and cerebellar atrophy. Thin acute subdural hematoma overlying the right cerebral convexity measuring 3 mm in greatest thickness (series 4, image 25). Redemonstrated patchy small to moderate-sized chronic cortically based infarcts within the bilateral frontal, parietal and occipital lobes. Background advanced ill-defined hypoattenuation within the cerebral white matter, nonspecific but compatible with chronic small vessel ischemic disease. Redemonstrated small  chronic right cerebellar infarct. No evidence of intracranial mass. No midline shift. Vascular: No hyperdense vessel.  Atherosclerotic calcifications. Skull: Normal. Negative for fracture or focal lesion. Sinuses/Orbits: Visualized orbits show no acute finding. No significant paranasal sinus disease at the imaged levels. Other: Right  parietal scalp hematoma. These results were called by telephone at the time of interpretation on 07/25/2020 at 12:59 pm to provider [facility] , who verbally acknowledged these results. IMPRESSION: Thin acute subdural hematoma overlying the right cerebral convexity, measuring 3 mm in greatest thickness. No significant mass effect. No midline shift. Right parietal scalp hematoma. Stable generalized atrophy of the brain, advanced chronic small vessel ischemic disease and remote cerebral and cerebellar infarcts. Electronically Signed   By: Kellie Simmering DO   On: 07/25/2020 12:59   US Carotid Bilateral  Result Date: 07/26/2020 CLINICAL DATA:  Syncope EXAM: BILATERAL CAROTID DUPLEX ULTRASOUND TECHNIQUE: Pearline Cables scale imaging, color Doppler and duplex ultrasound were performed of bilateral carotid and vertebral arteries in the neck. COMPARISON:  06/24/2014 FINDINGS: Criteria: Quantification of carotid stenosis is based on velocity parameters that correlate the residual internal carotid diameter with NASCET-based stenosis levels, using the diameter of the distal internal carotid lumen as the denominator for stenosis measurement. The following velocity measurements were obtained: RIGHT ICA: 159/22 cm/sec CCA: 220/25 cm/sec SYSTOLIC ICA/CCA RATIO:  1.2 ECA: 110 cm/sec LEFT ICA: 59/25 cm/sec CCA: 427/06 cm/sec SYSTOLIC ICA/CCA RATIO:  0.5 ECA: 62 cm/sec RIGHT CAROTID ARTERY: Diffuse intimal thickening in the common carotid. Moderate tortuosity. Eccentric partially calcified plaque at the bifurcation and in the proximal ICA with mild stenosis. Normal waveforms and color Doppler signal throughout. RIGHT VERTEBRAL ARTERY:  Normal flow direction and waveform. LEFT CAROTID ARTERY: Mild tortuosity. Somewhat irregular plaque in the bulb extending into proximal internal and external carotid arteries with mild stenosis. Normal waveforms and color Doppler signal. A nonspecific cardiac arrhythmia is noted. LEFT VERTEBRAL ARTERY:  Normal  flow direction and waveform. IMPRESSION: 1. Bilateral carotid bifurcation plaque resulting in less than 50% diameter ICA stenosis. 2. Antegrade bilateral vertebral arterial flow. 3. Nonspecific cardiac arrhythmia. Electronically Signed   By: Lucrezia Europe M.D.   On: 07/26/2020 12:37   ECHOCARDIOGRAM COMPLETE  Result Date: 07/26/2020    ECHOCARDIOGRAM REPORT   Patient Name:   [name] [facility] Date of Exam: 07/26/2020 Medical Rec #:  [id number]         Height:       66.0 in Accession #:    [id number]        Weight:       159.3 lb Date of Birth:  Apr 09, 1961          BSA:          1.815 m Patient Age:    59 years          BP:           113/66 mmHg Patient Gender: F                 HR:           66 bpm. Exam Location:  [facility] Procedure: 2D Echo, Color Doppler, Cardiac Doppler and Strain Analysis Indications:     R55 Syncope  History:         Patient has prior history of Echocardiogram examinations. CHF,                  CAD, Stroke and ESRD; Risk Factors:Hypertension, Dyslipidemia  and Current Smoker.  Sonographer:     Charmayne Sheer RDCS (AE) Referring Phys:  [id number] AMY N COX Diagnosing Phys: Neoma Laming MD  Sonographer Comments: Global longitudinal strain was attempted. IMPRESSIONS  1. Left ventricular ejection fraction, by estimation, is 25 to 30%. The left ventricle has severely decreased function. The left ventricle demonstrates global hypokinesis. The left ventricular internal cavity size was severely dilated. There is moderate  concentric left ventricular hypertrophy. Left ventricular diastolic parameters are consistent with Grade III diastolic dysfunction (restrictive).  2. Right ventricular systolic function is mildly reduced. The right ventricular size is mildly enlarged. Mildly increased right ventricular wall thickness.  3. Left atrial size was severely dilated.  4. Right atrial size was mild to moderately dilated.  5. The mitral valve is degenerative. Mild mitral valve regurgitation.  6. The  tricuspid valve is degenerative. Tricuspid valve regurgitation is mild to moderate.  7. The aortic valve is calcified. Aortic valve regurgitation is not visualized. Conclusion(s)/Recommendation(s): Findings consistent with dilated cardiomyopathy. FINDINGS  Left Ventricle: Left ventricular ejection fraction, by estimation, is 25 to 30%. The left ventricle has severely decreased function. The left ventricle demonstrates global hypokinesis. The left ventricular internal cavity size was severely dilated. There is moderate concentric left ventricular hypertrophy. Left ventricular diastolic parameters are consistent with Grade III diastolic dysfunction (restrictive). Right Ventricle: The right ventricular size is mildly enlarged. Mildly increased right ventricular wall thickness. Right ventricular systolic function is mildly reduced. Left Atrium: Left atrial size was severely dilated. Right Atrium: Right atrial size was mild to moderately dilated. Pericardium: There is no evidence of pericardial effusion. Mitral Valve: The mitral valve is degenerative in appearance. Mild mitral valve regurgitation. MV peak gradient, 4.8 mmHg. The mean mitral valve gradient is 2.0 mmHg. Tricuspid Valve: The tricuspid valve is degenerative in appearance. Tricuspid valve regurgitation is mild to moderate. Aortic Valve: The aortic valve is calcified. Aortic valve regurgitation is not visualized. Aortic valve mean gradient measures 4.0 mmHg. Aortic valve peak gradient measures 7.1 mmHg. Aortic valve area, by VTI measures 2.08 cm. Pulmonic Valve: The pulmonic valve was normal in structure. Pulmonic valve regurgitation is trivial. Aorta: The aortic root was not well visualized. IAS/Shunts: No atrial level shunt detected by color flow Doppler.  LEFT VENTRICLE PLAX 2D LVIDd:         6.10 cm      Diastology LVIDs:         5.30 cm      LV e' lateral:   7.83 cm/s LV PW:         1.00 cm      LV E/e' lateral: 9.6 LV IVS:        0.80 cm LVOT diam:      2.20 cm LV SV:         54 LV SV Index:   30 LVOT Area:     3.80 cm  LV Volumes (MOD) LV vol d, MOD A2C: 184.0 ml LV vol d, MOD A4C: 204.0 ml LV vol s, MOD A2C: 127.0 ml LV vol s, MOD A4C: 124.0 ml LV SV MOD A2C:     57.0 ml LV SV MOD A4C:     204.0 ml LV SV MOD BP:      71.1 ml RIGHT VENTRICLE RV Basal diam:  3.60 cm LEFT ATRIUM             Index       RIGHT ATRIUM  Index LA diam:        4.50 cm 2.48 cm/m  RA Area:     22.70 cm LA Vol (A2C):   91.8 ml 50.57 ml/m RA Volume:   68.40 ml  37.68 ml/m LA Vol (A4C):   65.9 ml 36.30 ml/m LA Biplane Vol: 79.3 ml 43.68 ml/m  AORTIC VALVE                   PULMONIC VALVE AV Area (Vmax):    2.18 cm    PV Vmax:       0.68 m/s AV Area (Vmean):   2.13 cm    PV Vmean:      47.000 cm/s AV Area (VTI):     2.08 cm    PV VTI:        0.119 m AV Vmax:           133.00 cm/s PV Peak grad:  1.9 mmHg AV Vmean:          94.600 cm/s PV Mean grad:  1.0 mmHg AV VTI:            0.260 m AV Peak Grad:      7.1 mmHg AV Mean Grad:      4.0 mmHg LVOT Vmax:         76.40 cm/s LVOT Vmean:        53.100 cm/s LVOT VTI:          0.142 m LVOT/AV VTI ratio: 0.55  AORTA Ao Root diam: 3.30 cm MITRAL VALVE               TRICUSPID VALVE MV Area (PHT): 5.23 cm    TR Peak grad:   50.7 mmHg MV Area VTI:   1.96 cm    TR Vmax:        356.00 cm/s MV Peak grad:  4.8 mmHg MV Mean grad:  2.0 mmHg    SHUNTS MV Vmax:       1.10 m/s    Systemic VTI:  0.14 m MV Vmean:      71.8 cm/s   Systemic Diam: 2.20 cm MV Decel Time: 145 msec MV E velocity: 75.00 cm/s MV A velocity: 53.60 cm/s MV E/A ratio:  1.40 Neoma Laming MD Electronically signed by Neoma Laming MD Signature Date/Time: 07/26/2020/12:51:32 PM    Final      Medications:   . albumin human     . Chlorhexidine Gluconate Cloth  6 each Topical Q0600  . cinacalcet  60 mg Oral QPM  . levothyroxine  75 mcg Oral QAC breakfast  . lidocaine-prilocaine   Topical Once  . pantoprazole  40 mg Oral Daily  . ranolazine  500 mg Oral BID  . sevelamer  carbonate  2,400 mg Oral TID WC  . sodium chloride flush  3 mL Intravenous Q12H   acetaminophen **OR** acetaminophen, albumin human, hydrOXYzine, midodrine, ondansetron **OR** ondansetron (ZOFRAN) IV, oxyCODONE-acetaminophen  Assessment/ Plan:  60 y.o. female with  PMHX of ESRD due to poorly controlled HTN, CVA, HTN, atrial fibrillation on Eliquis, Hep C, and liver cirrhosis with ascites  was admitted on 07/25/2020   Principal Problem:   Syncope and collapse Active Problems:   Generalized weakness   HTN (hypertension)   S/P CABG x 3   ESRD (end stage renal disease) on dialysis (HCC)   CAD, multiple vessel   Anemia of chronic renal failure   Subdural hematoma (HCC)  Syncope and collapse [R55] Syncope [R55] Subdural hematoma (  [place]) [O82.4J7B]   TTS-Left AVG  #. ESRD -completed treatment prior to admission Will monitor labs Stable from our stance Followed by [facility] Dr Candiss Norse   #. Anemia of CKD  Lab Results  Component Value Date   HGB 10.9 (L) 07/26/2020  Within acceptable range  #. Syncope Echo currently being completed CT scans completed-unremarkable Primary team to manage    LOS: 0 Colon Flattery, NP 2/11/20221:42 PM  [place], [place]

## 2020-07-26 NOTE — ED Notes (Signed)
Pt's BP notified to be low at this time.  Pharmacy consulted about order regarding if midodrine could be taken outside of dialysis, and they stated that it could be given at this time. Pharmacy to tube medication shortly.

## 2020-07-26 NOTE — ED Notes (Addendum)
Sharion Settler, NP notified about pt's hypotension.

## 2020-07-26 NOTE — Progress Notes (Signed)
*  PRELIMINARY RESULTS* Echocardiogram 2D Echocardiogram has been performed.  [name] 07/26/2020, 11:29 AM

## 2020-07-26 NOTE — ED Notes (Signed)
Pt stated she was feeling SOB.  Pt's O2 sats 95% on RA.  Pt states she uses 2L O2 as needed at home, so 2L applied to pt via [place] at this time.  Pt also given 20 mg midodrine now for persistent hypotension.  Will continue to monitor.

## 2020-07-26 NOTE — Hospital Course (Signed)
60 year old female with past medical history of ESRD on hemodialysis and history of CVA both secondary to poorly controlled hypertension, A. fib on Eliquis and amiodarone, hepatitis C, liver cirrhosis with ascites, pericardial effusion, sternal wound dehiscence, delayed surgical wound healing, anxiety/depression, chronic pain.  She is presented to the ED on 07/25/2020 after a syncopal episode with loss of consciousness and fall.  This was preceded by feeling lightheaded.  She sustained head injury and found to have subdural hematoma and scalp hematoma.  Neurosurgery was consulted.  Repeat head CT obtained 6 hours after the initial showed unchanged size of the thin subdural hematoma overlying the right cerebral convexity measuring 2-3 mm in greatest thickness, no evidence of new intracranial hemorrhage, unchanged right parietal scalp hematoma.    Patient admitted for close monitoring and further evaluation and management.

## 2020-07-26 NOTE — Progress Notes (Addendum)
PROGRESS NOTE    [name]   AYT:[id number]  DOB: Oct 04, 1960  PCP: [name], MD    DOA: 07/25/2020 LOS: 0   Brief Narrative   60 year old female with past medical history of ESRD on hemodialysis and history of CVA both secondary to poorly controlled hypertension, A. fib on Eliquis and amiodarone, hepatitis C, liver cirrhosis with ascites, pericardial effusion, sternal wound dehiscence, delayed surgical wound healing, anxiety/depression, chronic pain.  She is presented to the ED on 07/25/2020 after a syncopal episode with loss of consciousness and fall.  This was preceded by feeling lightheaded.  She sustained head injury and found to have subdural hematoma and scalp hematoma.  Neurosurgery was consulted.  Repeat head CT obtained 6 hours after the initial showed unchanged size of the thin subdural hematoma overlying the right cerebral convexity measuring 2-3 mm in greatest thickness, no evidence of new intracranial hemorrhage, unchanged right parietal scalp hematoma.    Patient admitted for close monitoring and further evaluation and management.    Assessment & Plan   Principal Problem:   Syncope and collapse Active Problems:   Generalized weakness   HTN (hypertension)   S/P CABG x 3   ESRD (end stage renal disease) on dialysis (HCC)   CAD, multiple vessel   Anemia of chronic renal failure   Subdural hematoma (HCC)   Syncope and collapse -likely multifactorial with hypotension the primary contributor. Echo showed EF 25 to 30%, global LV hypokinesis with severe LV dilation, moderate LVH, grade 3 diastolic dysfunction, biatrial dilation. --Follow-up carotid Dopplers --Monitor on telemetry --Hold antihypertensives  Hypotension -present on admission 88/51,  Persistent through this AM (78/48), but now has improved.  Pt reported not been able to eat or drink much recently.  Suspect hypotension may have caused her syncopal episode.   --Recommend pt monitor BP at  home --Hold all antihypertensive medications for now & cautiously resume as BP will tolerate  Subdural hematoma - due to above.  Seen by neurosurgery on admission.  Eliquis was reversed.   Repeat head CT six hours after initial was stable.  No increase in headache, no neurologic deficits.   Per neurosurg, no further imaging required.  No AED recommended.  Outpatient follow up.  Right parietal scalp hematoma - POA, due to above. Monitor for signs of infection or expansion of hematoma.  PRN analgesic for headaches.  ESRD - nephrology consulted for MWF dialysis.   HFrEF /dilated cardiomyopathy-patient has EF of 25 to 35%, grade 3 diastolic function.   Currently appears well compensated.   --Hold Coreg, Imdur, Entresto given hypotension as above.   --Resume with caution once BP will tolerate.   --Monitor volume status closely, volume management by dialysis as patient is an uric.  Coronary artery disease status post CABG with angina-chronic, stable without active chest pain.  Aspirin currently held.  Continue Ranexa.  Hold Coreg as above.  History of atrial flutter - Eliquis is on hold due to subdural hematoma.  Heart rate is controlled.  Hold Coreg due to hypotension.  Hypothyroidism - on levothyroxine  History of hepatitis C -no active issues, outpatient follow-up   DVT prophylaxis: Place TED hose Start: 07/25/20 1524   Diet:  Diet Orders (From admission, onward)    Start     Ordered   07/25/20 1941  Diet Heart Room service appropriate? Yes; Fluid consistency: Thin  Diet effective now       Question Answer Comment  Room service appropriate? Yes   Fluid  consistency: Thin      07/25/20 1940            Code Status: Full Code    Subjective 07/26/20    Patient seen today at bedside before dialysis.  She reports feeling somewhat better today.  Does endorse a headache where she hit her head.  Currently denies dizziness, lightheadedness, chest pain or palpitations, nausea vomiting  or other acute complaints.   Disposition Plan & Communication   Status is: Inpatient  Remains inpatient appropriate because:Inpatient level of care appropriate due to severity of illness  With hypotension.  Ongoing evaluation for syncope as well.  Dispo: The patient is from: Home              Anticipated d/c is to: Home              Anticipated d/c date is: 1-2 days              Patient currently is not medically stable to d/c.   Difficult to place patient No    Family Communication: None at bedside will attempt to call.   Consults, Procedures, Significant Events   Consultants:   Nephrology  Neurosurgery  Procedures:   Dialysis  Antimicrobials:  Anti-infectives (From admission, onward)   None        Objective   Vitals:   07/26/20 0515 07/26/20 0553 07/26/20 0600 07/26/20 0700  BP:  (!) 78/48 (!) 83/53 99/66  Pulse:  70 66 75  Resp:  13 13 16   Temp:      TempSrc:      SpO2: 96% 99% 99% 100%  Weight:      Height:       No intake or output data in the 24 hours ending 07/26/20 0730 Filed Weights   07/25/20 1116  Weight: 72.6 kg    Physical Exam:  General exam: awake, alert, no acute distress HEENT: right upper posterior scalp hematoma with no bleeding, moist mucus membranes, hearing grossly normal  Respiratory system: CTAB, no wheezes, rales or rhonchi, normal respiratory effort. Cardiovascular system: normal S1/S2, RRR, no pedal edema.   Central nervous system: A&O x3. no gross focal neurologic deficits, normal speech Extremities: moves all, LUE fistula, normal tone Psychiatry: normal mood, congruent affect, judgement and insight appear normal  Labs   Data Reviewed: I have personally reviewed following labs and imaging studies  CBC: Recent Labs  Lab 07/25/20 1121 07/26/20 0459  WBC 3.3* 3.7*  HGB 12.2 10.9*  HCT 38.3 32.5*  MCV 104.4* 101.2*  PLT 152 935*   Basic Metabolic Panel: Recent Labs  Lab 07/25/20 1121 07/26/20 0459  NA 139  139  K 4.5 4.0  CL 99 102  CO2 26 23  GLUCOSE 101* 95  BUN 42* 56*  CREATININE 6.49* 7.75*  CALCIUM 8.1* 7.0*   GFR: Estimated Creatinine Clearance: 8.2 mL/min (A) (by C-G formula based on SCr of 7.75 mg/dL (H)). Liver Function Tests: Recent Labs  Lab 07/26/20 0459  ALBUMIN 3.1*   No results for input(s): LIPASE, AMYLASE in the last 168 hours. No results for input(s): AMMONIA in the last 168 hours. Coagulation Profile: No results for input(s): INR, PROTIME in the last 168 hours. Cardiac Enzymes: No results for input(s): CKTOTAL, CKMB, CKMBINDEX, TROPONINI in the last 168 hours. BNP (last 3 results) No results for input(s): PROBNP in the last 8760 hours. HbA1C: No results for input(s): HGBA1C in the last 72 hours. CBG: No results for input(s): GLUCAP in the  last 168 hours. Lipid Profile: No results for input(s): CHOL, HDL, LDLCALC, TRIG, CHOLHDL, LDLDIRECT in the last 72 hours. Thyroid Function Tests: Recent Labs    07/25/20 1827  TSH 0.145*   Anemia Panel: No results for input(s): VITAMINB12, FOLATE, FERRITIN, TIBC, IRON, RETICCTPCT in the last 72 hours. Sepsis Labs: No results for input(s): PROCALCITON, LATICACIDVEN in the last 168 hours.  Recent Results (from the past 240 hour(s))  Resp Panel by RT-PCR (Flu A&B, Covid) Nasopharyngeal Swab     Status: None   Collection Time: 07/25/20 12:29 PM   Specimen: Nasopharyngeal Swab; Nasopharyngeal(NP) swabs in vial transport medium  Result Value Ref Range Status   SARS Coronavirus 2 by RT PCR NEGATIVE NEGATIVE Final    Comment: (NOTE) SARS-CoV-2 target nucleic acids are NOT DETECTED.  The SARS-CoV-2 RNA is generally detectable in upper respiratory specimens during the acute phase of infection. The lowest concentration of SARS-CoV-2 viral copies this assay can detect is 138 copies/mL. A negative result does not preclude SARS-Cov-2 infection and should not be used as the sole basis for treatment or other patient  management decisions. A negative result may occur with  improper specimen collection/handling, submission of specimen other than nasopharyngeal swab, presence of viral mutation(s) within the areas targeted by this assay, and inadequate number of viral copies(<138 copies/mL). A negative result must be combined with clinical observations, patient history, and epidemiological information. The expected result is Negative.  Fact Sheet for Patients:  EntrepreneurPulse.com.au  Fact Sheet for Healthcare Providers:  IncredibleEmployment.be  This test is no t yet approved or cleared by the [place] FDA and  has been authorized for detection and/or diagnosis of SARS-CoV-2 by FDA under an Emergency Use Authorization (EUA). This EUA will remain  in effect (meaning this test can be used) for the duration of the COVID-19 declaration under Section 564(b)(1) of the Act, 21 U.S.C.section 360bbb-3(b)(1), unless the authorization is terminated  or revoked sooner.       Influenza A by PCR NEGATIVE NEGATIVE Final   Influenza B by PCR NEGATIVE NEGATIVE Final    Comment: (NOTE) The Xpert Xpress SARS-CoV-2/FLU/RSV plus assay is intended as an aid in the diagnosis of influenza from Nasopharyngeal swab specimens and should not be used as a sole basis for treatment. Nasal washings and aspirates are unacceptable for Xpert Xpress SARS-CoV-2/FLU/RSV testing.  Fact Sheet for Patients: EntrepreneurPulse.com.au  Fact Sheet for Healthcare Providers: IncredibleEmployment.be  This test is not yet approved or cleared by the [place] FDA and has been authorized for detection and/or diagnosis of SARS-CoV-2 by FDA under an Emergency Use Authorization (EUA). This EUA will remain in effect (meaning this test can be used) for the duration of the COVID-19 declaration under Section 564(b)(1) of the Act, 21 U.S.C. section 360bbb-3(b)(1),  unless the authorization is terminated or revoked.  Performed at [facility], [place], [place] [address]       Imaging Studies   CT Head Wo Contrast  Result Date: 07/25/2020 CLINICAL DATA:  Syncopal episodes on unconscious by neighbors, head trauma patient has hematoma on back of head. EXAM: CT HEAD WITHOUT CONTRAST TECHNIQUE: Contiguous axial images were obtained from the base of the skull through the vertex without intravenous contrast. COMPARISON:  Head CT same day at 1242 hours FINDINGS: Brain: Unchanged size of the thin subdural hematoma overlying the right cerebral convexity measuring 2-3 mm in greatest thickness (series 4, image 29). No evidence of new intracranial hemorrhage. Mild global parenchymal volume loss  and ex vacuo dilatation of ventricular system. Again seen are patchy small to moderate-sized chronic cortically based infarcts in the bilateral frontal, parietal and occipital lobes. Similar burden of advanced chronic small-vessel white matter disease. Chronic right cerebellar infarct. Vascular: No hyperdense vessel.  Atherosclerotic calcification. Skull: Normal. Negative for fracture or focal lesion. Sinuses/Orbits: No acute finding. Other: Right parietal scalp hematoma measuring 4 mm, unchanged. IMPRESSION: 1. Unchanged size of the thin subdural hematoma overlying the right cerebral convexity measuring 2-3 mm in greatest thickness. No evidence of new intracranial hemorrhage. 2. Unchanged right parietal scalp hematoma. Electronically Signed   By: Dahlia Bailiff MD   On: 07/25/2020 19:14   CT HEAD WO CONTRAST  Result Date: 07/25/2020 CLINICAL DATA:  Head trauma, moderate/severe, syncope with head injury. Dizziness and headache. EXAM: CT HEAD WITHOUT CONTRAST TECHNIQUE: Contiguous axial images were obtained from the base of the skull through the vertex without intravenous contrast. COMPARISON:  Prior head CT 02/06/2020. FINDINGS: Brain: Mild cerebral and  cerebellar atrophy. Thin acute subdural hematoma overlying the right cerebral convexity measuring 3 mm in greatest thickness (series 4, image 25). Redemonstrated patchy small to moderate-sized chronic cortically based infarcts within the bilateral frontal, parietal and occipital lobes. Background advanced ill-defined hypoattenuation within the cerebral white matter, nonspecific but compatible with chronic small vessel ischemic disease. Redemonstrated small chronic right cerebellar infarct. No evidence of intracranial mass. No midline shift. Vascular: No hyperdense vessel.  Atherosclerotic calcifications. Skull: Normal. Negative for fracture or focal lesion. Sinuses/Orbits: Visualized orbits show no acute finding. No significant paranasal sinus disease at the imaged levels. Other: Right parietal scalp hematoma. These results were called by telephone at the time of interpretation on 07/25/2020 at 12:59 pm to provider [facility] , who verbally acknowledged these results. IMPRESSION: Thin acute subdural hematoma overlying the right cerebral convexity, measuring 3 mm in greatest thickness. No significant mass effect. No midline shift. Right parietal scalp hematoma. Stable generalized atrophy of the brain, advanced chronic small vessel ischemic disease and remote cerebral and cerebellar infarcts. Electronically Signed   By: Kellie Simmering DO   On: 07/25/2020 12:59     Medications   Scheduled Meds: . cinacalcet  60 mg Oral QPM  . levothyroxine  75 mcg Oral QAC breakfast  . pantoprazole  40 mg Oral Daily  . ranolazine  500 mg Oral BID  . sevelamer carbonate  2,400 mg Oral TID WC  . sodium chloride flush  3 mL Intravenous Q12H   Continuous Infusions: . albumin human         LOS: 0 days    Time spent: 30 minutes    Ezekiel Slocumb, DO [facility]  07/26/2020, 7:30 AM      If 7PM-7AM, please contact night-coverage. How to contact the [facility] Attending or Consulting provider [place] or  covering provider during after hours [place], for this patient?    1. Check the care team in [facility] and look for a) attending/consulting [facility] provider listed and b) the [facility] team listed 2. Log into [email or web address] and use [place]'s universal password to access. If you do not have the password, please contact the hospital operator. 3. Locate the [facility] provider you are looking for under [facility] and page to a number that you can be directly reached. 4. If you still have difficulty reaching the provider, please page the [facility] (Director on Call) for the Hospitalists listed on amion for assistance.

## 2020-07-27 LAB — HEPATITIS B SURFACE ANTIGEN: Hepatitis B Surface Ag: NONREACTIVE

## 2020-07-27 LAB — HEPATITIS B DNA, ULTRAQUANTITATIVE, PCR
HBV DNA SERPL PCR-ACNC: NOT DETECTED IU/mL
HBV DNA SERPL PCR-LOG IU: UNDETERMINED log10 IU/mL

## 2020-07-27 LAB — CBC
HCT: 35 % — ABNORMAL LOW (ref 36.0–46.0)
Hemoglobin: 11.3 g/dL — ABNORMAL LOW (ref 12.0–15.0)
MCH: 33.1 pg (ref 26.0–34.0)
MCHC: 32.3 g/dL (ref 30.0–36.0)
MCV: 102.6 fL — ABNORMAL HIGH (ref 80.0–100.0)
Platelets: 126 10*3/uL — ABNORMAL LOW (ref 150–400)
RBC: 3.41 MIL/uL — ABNORMAL LOW (ref 3.87–5.11)
RDW: 12.8 % (ref 11.5–15.5)
WBC: 2.9 10*3/uL — ABNORMAL LOW (ref 4.0–10.5)
nRBC: 0 % (ref 0.0–0.2)

## 2020-07-27 LAB — GLUCOSE, CAPILLARY: Glucose-Capillary: 94 mg/dL (ref 70–99)

## 2020-07-27 LAB — PARATHYROID HORMONE, INTACT (NO CA): PTH: 627 pg/mL — ABNORMAL HIGH (ref 15–65)

## 2020-07-27 LAB — HEPATITIS B SURFACE ANTIBODY, QUANTITATIVE: Hep B S AB Quant (Post): <3.1 m[IU]/mL — ABNORMAL LOW (ref >9.9)

## 2020-07-27 NOTE — Plan of Care (Signed)
  Problem: Safety: Goal: Ability to remain free from injury will improve Outcome: Progressing   

## 2020-07-27 NOTE — Progress Notes (Signed)
[place], [place] 07/27/20  Subjective:   Hemodialysis yesterday. UF of 1.5 liters.   Objective:  Vital signs in last 24 hours:  Temp:  [97.4 F (36.3 C)-98.9 F (37.2 C)] 97.4 F (36.3 C) (02/12 0732) Pulse Rate:  [63-80] 75 (02/12 0732) Resp:  [11-21] 16 (02/12 0732) BP: (90-120)/(46-99) 112/72 (02/12 0732) SpO2:  [100 %] 100 % (02/12 0732) Weight:  [74.5 kg] 74.5 kg (02/12 0331)  Weight change: -0.318 kg Filed Weights   07/25/20 1116 07/26/20 0931 07/27/20 0331  Weight: 72.6 kg 72.3 kg 74.5 kg    Intake/Output:    Intake/Output Summary (Last 24 hours) at 07/27/2020 1332 Last data filed at 07/27/2020 0950 Gross per 24 hour  Intake 600 ml  Output 1500 ml  Net -900 ml     Physical Exam: General: NAD, resting comfortable  HEENT Normocephalic, moist oral mucosa  Pulm/lungs Clear bilaterally  CVS/Heart regular  Abdomen:  Soft, nontender  Extremities: No peripheral edema  Neurologic: Alert, oriented  Skin: No masses or rashes  Access: Left AVF       Basic Metabolic Panel:  Recent Labs  Lab 07/25/20 1121 07/26/20 0459  NA 139 139  K 4.5 4.0  CL 99 102  CO2 26 23  GLUCOSE 101* 95  BUN 42* 56*  CREATININE 6.49* 7.75*  CALCIUM 8.1* 7.0*  PHOS  --  4.0     CBC: Recent Labs  Lab 07/25/20 1121 07/26/20 0459 07/27/20 0528  WBC 3.3* 3.7* 2.9*  HGB 12.2 10.9* 11.3*  HCT 38.3 32.5* 35.0*  MCV 104.4* 101.2* 102.6*  PLT 152 133* 126*      Lab Results  Component Value Date   HEPBSAG NON REACTIVE 07/27/2020   HEPBSAB Non Reactive 01/18/2019   HEPBIGM Negative 01/18/2019      Microbiology:  Recent Results (from the past 240 hour(s))  Resp Panel by RT-PCR (Flu A&B, Covid) Nasopharyngeal Swab     Status: None   Collection Time: 07/25/20 12:29 PM   Specimen: Nasopharyngeal Swab; Nasopharyngeal(NP) swabs in vial transport medium  Result Value Ref Range Status   SARS Coronavirus 2 by RT PCR NEGATIVE NEGATIVE Final     Comment: (NOTE) SARS-CoV-2 target nucleic acids are NOT DETECTED.  The SARS-CoV-2 RNA is generally detectable in upper respiratory specimens during the acute phase of infection. The lowest concentration of SARS-CoV-2 viral copies this assay can detect is 138 copies/mL. A negative result does not preclude SARS-Cov-2 infection and should not be used as the sole basis for treatment or other patient management decisions. A negative result may occur with  improper specimen collection/handling, submission of specimen other than nasopharyngeal swab, presence of viral mutation(s) within the areas targeted by this assay, and inadequate number of viral copies(<138 copies/mL). A negative result must be combined with clinical observations, patient history, and epidemiological information. The expected result is Negative.  Fact Sheet for Patients:  EntrepreneurPulse.com.au  Fact Sheet for Healthcare Providers:  IncredibleEmployment.be  This test is no t yet approved or cleared by the [place] FDA and  has been authorized for detection and/or diagnosis of SARS-CoV-2 by FDA under an Emergency Use Authorization (EUA). This EUA will remain  in effect (meaning this test can be used) for the duration of the COVID-19 declaration under Section 564(b)(1) of the Act, 21 U.S.C.section 360bbb-3(b)(1), unless the authorization is terminated  or revoked sooner.       Influenza A by PCR NEGATIVE NEGATIVE Final   Influenza B by  PCR NEGATIVE NEGATIVE Final    Comment: (NOTE) The Xpert Xpress SARS-CoV-2/FLU/RSV plus assay is intended as an aid in the diagnosis of influenza from Nasopharyngeal swab specimens and should not be used as a sole basis for treatment. Nasal washings and aspirates are unacceptable for Xpert Xpress SARS-CoV-2/FLU/RSV testing.  Fact Sheet for Patients: EntrepreneurPulse.com.au  Fact Sheet for Healthcare  Providers: IncredibleEmployment.be  This test is not yet approved or cleared by the [place] FDA and has been authorized for detection and/or diagnosis of SARS-CoV-2 by FDA under an Emergency Use Authorization (EUA). This EUA will remain in effect (meaning this test can be used) for the duration of the COVID-19 declaration under Section 564(b)(1) of the Act, 21 U.S.C. section 360bbb-3(b)(1), unless the authorization is terminated or revoked.  Performed at [facility], [address] [place], [place] [address]     Coagulation Studies: No results for input(s): LABPROT, INR in the last 72 hours.  Urinalysis: No results for input(s): COLORURINE, LABSPEC, PHURINE, GLUCOSEU, HGBUR, BILIRUBINUR, KETONESUR, PROTEINUR, UROBILINOGEN, NITRITE, LEUKOCYTESUR in the last 72 hours.  Invalid input(s): APPERANCEUR    Imaging: CT Head Wo Contrast  Result Date: 07/25/2020 CLINICAL DATA:  Syncopal episodes on unconscious by neighbors, head trauma patient has hematoma on back of head. EXAM: CT HEAD WITHOUT CONTRAST TECHNIQUE: Contiguous axial images were obtained from the base of the skull through the vertex without intravenous contrast. COMPARISON:  Head CT same day at 1242 hours FINDINGS: Brain: Unchanged size of the thin subdural hematoma overlying the right cerebral convexity measuring 2-3 mm in greatest thickness (series 4, image 29). No evidence of new intracranial hemorrhage. Mild global parenchymal volume loss and ex vacuo dilatation of ventricular system. Again seen are patchy small to moderate-sized chronic cortically based infarcts in the bilateral frontal, parietal and occipital lobes. Similar burden of advanced chronic small-vessel white matter disease. Chronic right cerebellar infarct. Vascular: No hyperdense vessel.  Atherosclerotic calcification. Skull: Normal. Negative for fracture or focal lesion. Sinuses/Orbits: No acute finding. Other: Right parietal  scalp hematoma measuring 4 mm, unchanged. IMPRESSION: 1. Unchanged size of the thin subdural hematoma overlying the right cerebral convexity measuring 2-3 mm in greatest thickness. No evidence of new intracranial hemorrhage. 2. Unchanged right parietal scalp hematoma. Electronically Signed   By: Dahlia Bailiff MD   On: 07/25/2020 19:14   US Carotid Bilateral  Result Date: 07/26/2020 CLINICAL DATA:  Syncope EXAM: BILATERAL CAROTID DUPLEX ULTRASOUND TECHNIQUE: Pearline Cables scale imaging, color Doppler and duplex ultrasound were performed of bilateral carotid and vertebral arteries in the neck. COMPARISON:  06/24/2014 FINDINGS: Criteria: Quantification of carotid stenosis is based on velocity parameters that correlate the residual internal carotid diameter with NASCET-based stenosis levels, using the diameter of the distal internal carotid lumen as the denominator for stenosis measurement. The following velocity measurements were obtained: RIGHT ICA: 159/22 cm/sec CCA: 458/09 cm/sec SYSTOLIC ICA/CCA RATIO:  1.2 ECA: 110 cm/sec LEFT ICA: 59/25 cm/sec CCA: 983/38 cm/sec SYSTOLIC ICA/CCA RATIO:  0.5 ECA: 62 cm/sec RIGHT CAROTID ARTERY: Diffuse intimal thickening in the common carotid. Moderate tortuosity. Eccentric partially calcified plaque at the bifurcation and in the proximal ICA with mild stenosis. Normal waveforms and color Doppler signal throughout. RIGHT VERTEBRAL ARTERY:  Normal flow direction and waveform. LEFT CAROTID ARTERY: Mild tortuosity. Somewhat irregular plaque in the bulb extending into proximal internal and external carotid arteries with mild stenosis. Normal waveforms and color Doppler signal. A nonspecific cardiac arrhythmia is noted. LEFT VERTEBRAL ARTERY:  Normal flow direction and waveform.  IMPRESSION: 1. Bilateral carotid bifurcation plaque resulting in less than 50% diameter ICA stenosis. 2. Antegrade bilateral vertebral arterial flow. 3. Nonspecific cardiac arrhythmia. Electronically Signed   By: Lucrezia Europe M.D.   On: 07/26/2020 12:37   ECHOCARDIOGRAM COMPLETE  Result Date: 07/26/2020    ECHOCARDIOGRAM REPORT   Patient Name:   SIMRAT KENDRICK [facility] Date of Exam: 07/26/2020 Medical Rec #:  [id number]         Height:       66.0 in Accession #:    [id number]        Weight:       159.3 lb Date of Birth:  1961-02-25          BSA:          1.815 m Patient Age:    34 years          BP:           113/66 mmHg Patient Gender: F                 HR:           66 bpm. Exam Location:  [facility] Procedure: 2D Echo, Color Doppler, Cardiac Doppler and Strain Analysis Indications:     R55 Syncope  History:         Patient has prior history of Echocardiogram examinations. CHF,                  CAD, Stroke and ESRD; Risk Factors:Hypertension, Dyslipidemia                  and Current Smoker.  Sonographer:     Charmayne Sheer RDCS (AE) Referring Phys:  [id number] AMY N COX Diagnosing Phys: Neoma Laming MD  Sonographer Comments: Global longitudinal strain was attempted. IMPRESSIONS  1. Left ventricular ejection fraction, by estimation, is 25 to 30%. The left ventricle has severely decreased function. The left ventricle demonstrates global hypokinesis. The left ventricular internal cavity size was severely dilated. There is moderate  concentric left ventricular hypertrophy. Left ventricular diastolic parameters are consistent with Grade III diastolic dysfunction (restrictive).  2. Right ventricular systolic function is mildly reduced. The right ventricular size is mildly enlarged. Mildly increased right ventricular wall thickness.  3. Left atrial size was severely dilated.  4. Right atrial size was mild to moderately dilated.  5. The mitral valve is degenerative. Mild mitral valve regurgitation.  6. The tricuspid valve is degenerative. Tricuspid valve regurgitation is mild to moderate.  7. The aortic valve is calcified. Aortic valve regurgitation is not visualized. Conclusion(s)/Recommendation(s): Findings consistent with dilated cardiomyopathy.  FINDINGS  Left Ventricle: Left ventricular ejection fraction, by estimation, is 25 to 30%. The left ventricle has severely decreased function. The left ventricle demonstrates global hypokinesis. The left ventricular internal cavity size was severely dilated. There is moderate concentric left ventricular hypertrophy. Left ventricular diastolic parameters are consistent with Grade III diastolic dysfunction (restrictive). Right Ventricle: The right ventricular size is mildly enlarged. Mildly increased right ventricular wall thickness. Right ventricular systolic function is mildly reduced. Left Atrium: Left atrial size was severely dilated. Right Atrium: Right atrial size was mild to moderately dilated. Pericardium: There is no evidence of pericardial effusion. Mitral Valve: The mitral valve is degenerative in appearance. Mild mitral valve regurgitation. MV peak gradient, 4.8 mmHg. The mean mitral valve gradient is 2.0 mmHg. Tricuspid Valve: The tricuspid valve is degenerative in appearance. Tricuspid valve regurgitation is mild to moderate. Aortic Valve: The aortic valve is  calcified. Aortic valve regurgitation is not visualized. Aortic valve mean gradient measures 4.0 mmHg. Aortic valve peak gradient measures 7.1 mmHg. Aortic valve area, by VTI measures 2.08 cm. Pulmonic Valve: The pulmonic valve was normal in structure. Pulmonic valve regurgitation is trivial. Aorta: The aortic root was not well visualized. IAS/Shunts: No atrial level shunt detected by color flow Doppler.  LEFT VENTRICLE PLAX 2D LVIDd:         6.10 cm      Diastology LVIDs:         5.30 cm      LV e' lateral:   7.83 cm/s LV PW:         1.00 cm      LV E/e' lateral: 9.6 LV IVS:        0.80 cm LVOT diam:     2.20 cm LV SV:         54 LV SV Index:   30 LVOT Area:     3.80 cm  LV Volumes (MOD) LV vol d, MOD A2C: 184.0 ml LV vol d, MOD A4C: 204.0 ml LV vol s, MOD A2C: 127.0 ml LV vol s, MOD A4C: 124.0 ml LV SV MOD A2C:     57.0 ml LV SV MOD A4C:      204.0 ml LV SV MOD BP:      71.1 ml RIGHT VENTRICLE RV Basal diam:  3.60 cm LEFT ATRIUM             Index       RIGHT ATRIUM           Index LA diam:        4.50 cm 2.48 cm/m  RA Area:     22.70 cm LA Vol (A2C):   91.8 ml 50.57 ml/m RA Volume:   68.40 ml  37.68 ml/m LA Vol (A4C):   65.9 ml 36.30 ml/m LA Biplane Vol: 79.3 ml 43.68 ml/m  AORTIC VALVE                   PULMONIC VALVE AV Area (Vmax):    2.18 cm    PV Vmax:       0.68 m/s AV Area (Vmean):   2.13 cm    PV Vmean:      47.000 cm/s AV Area (VTI):     2.08 cm    PV VTI:        0.119 m AV Vmax:           133.00 cm/s PV Peak grad:  1.9 mmHg AV Vmean:          94.600 cm/s PV Mean grad:  1.0 mmHg AV VTI:            0.260 m AV Peak Grad:      7.1 mmHg AV Mean Grad:      4.0 mmHg LVOT Vmax:         76.40 cm/s LVOT Vmean:        53.100 cm/s LVOT VTI:          0.142 m LVOT/AV VTI ratio: 0.55  AORTA Ao Root diam: 3.30 cm MITRAL VALVE               TRICUSPID VALVE MV Area (PHT): 5.23 cm    TR Peak grad:   50.7 mmHg MV Area VTI:   1.96 cm    TR Vmax:        356.00 cm/s MV Peak grad:  4.8 mmHg  MV Mean grad:  2.0 mmHg    SHUNTS MV Vmax:       1.10 m/s    Systemic VTI:  0.14 m MV Vmean:      71.8 cm/s   Systemic Diam: 2.20 cm MV Decel Time: 145 msec MV E velocity: 75.00 cm/s MV A velocity: 53.60 cm/s MV E/A ratio:  1.40 Neoma Laming MD Electronically signed by Neoma Laming MD Signature Date/Time: 07/26/2020/12:51:32 PM    Final      Medications:   . albumin human     . Chlorhexidine Gluconate Cloth  6 each Topical Q0600  . cinacalcet  60 mg Oral QPM  . levothyroxine  75 mcg Oral QAC breakfast  . pantoprazole  40 mg Oral Daily  . ranolazine  500 mg Oral BID  . sevelamer carbonate  2,400 mg Oral TID WC  . sodium chloride flush  3 mL Intravenous Q12H   acetaminophen **OR** acetaminophen, albumin human, hydrOXYzine, midodrine, ondansetron **OR** ondansetron (ZOFRAN) IV, oxyCODONE-acetaminophen  Assessment/ Plan:  60 y.o. female with  PMHX of ESRD  due to poorly controlled HTN, CVA, HTN, atrial fibrillation on Eliquis, Hep C, and liver cirrhosis with ascites  was admitted on 07/25/2020   Principal Problem:   Syncope and collapse Active Problems:   Generalized weakness   HTN (hypertension)   S/P CABG x 3   ESRD (end stage renal disease) on dialysis (HCC)   CAD, multiple vessel   Anemia of chronic renal failure   Subdural hematoma ([place])  Syncope and collapse [R55] Syncope [R55] Subdural hematoma ([place]) [D98.3J8S]   [facility] MWF [place] left AVG   #. ESRD:  Continue MWF schedule.   #. Anemia of CKD: macrocytic.  EPO with HD treatments  #Secondary Hyperparathyroidism:  - Continue sevelamer and cinacalcet.   # hypertension: blood pressure at goal.    LOS: [place], NP 2/12/20221:32 [place], [place]

## 2020-07-27 NOTE — Progress Notes (Signed)
Discharge instructions explained/pt verbalized understanding. IV and tele removed. Will transport off unit via wheelchair when ride arrives.  

## 2020-07-27 NOTE — Discharge Summary (Signed)
Physician Discharge Summary  [name] QMG:[id number] DOB: Oct 09, 1960 DOA: 07/25/2020  PCP: [name], MD  Admit date: 07/25/2020 Discharge date: 07/27/2020  Admitted From: home Disposition:  home  Recommendations for Outpatient Follow-up:  1. Follow up with PCP in 1-2 weeks 2. Please obtain BMP/CBC in one week 3. Please follow up with cardiology next week 4. Follow up on patient's BP/heart medications - most of which are currently held due to hypotension causing syncope with collapse and subdural hematoma.  Resume meds with caution and monitor BP closely.  Home Health: No  Equipment/Devices: None   Discharge Condition: Stable CODE STATUS: Full  Diet recommendation: Heart Healthy  Discharge Diagnoses: Principal Problem:   Syncope and collapse Active Problems:   Generalized weakness   HTN (hypertension)   S/P CABG x 3   ESRD (end stage renal disease) on dialysis (HCC)   CAD, multiple vessel   Anemia of chronic renal failure   Subdural hematoma (HCC)    Summary of HPI and Hospital Course:  60 year old female with past medical history of ESRD on hemodialysis and history of CVA both secondary to poorly controlled hypertension, A. fib on Eliquis and amiodarone, hepatitis C, liver cirrhosis with ascites, pericardial effusion, sternal wound dehiscence, delayed surgical wound healing, anxiety/depression, chronic pain.  She is presented to the ED on 07/25/2020 after a syncopal episode with loss of consciousness and fall.  This was preceded by feeling lightheaded.  She sustained head injury and found to have subdural hematoma and scalp hematoma.  Neurosurgery was consulted.  Repeat head CT obtained 6 hours after the initial showed unchanged size of the thin subdural hematoma overlying the right cerebral convexity measuring 2-3 mm in greatest thickness, no evidence of new intracranial hemorrhage, unchanged right parietal scalp hematoma.    Patient admitted for close monitoring  and further evaluation and management.     Syncope and collapse - Due to severe hypotension. Echo showed EF 25 to 30%, global LV hypokinesis with severe LV dilation, moderate LVH, grade 3 diastolic dysfunction, biatrial dilation.  Monitored on telemetry with no dysrhythmias noted. --Hold antihypertensives  Hypotension -present on admission 88/51,  Persistent through this AM (78/48), but now has improved.  Pt reported not been able to eat or drink much recently.  Suspect hypotension may have caused her syncopal episode.   --Recommend pt monitor BP at home --Hold all antihypertensive medications for now & cautiously resume as BP will tolerate  Subdural hematoma - due to above.  Seen by neurosurgery on admission.  Eliquis was reversed.   Repeat head CT six hours after initial was stable.  No increase in headache, no neurologic deficits.   Per neurosurg, no further imaging required.  No AED recommended.  Outpatient follow up. --Hold Eliquis and ASA   Right parietal scalp hematoma - POA, due to above. Patient remained free of any signs of infection or expansion of hematoma.  PRN analgesic for headaches.  ESRD - nephrology consulted for MWF dialysis.   HFrEF /dilated cardiomyopathy-patient has EF of 25 to 32%, grade 3 diastolic function.   Currently appears well compensated.   --Hold Coreg, Imdur, Entresto given hypotension as above.   --Resume with caution once BP will tolerate.    Coronary artery disease status post CABG with angina-chronic, stable without active chest pain.   Aspirin stopped for now.  Continue Ranexa.  Hold Coreg as above.  History of atrial flutter - Eliquis is on hold due to subdural hematoma.  Heart rate is  controlled.   Hold Coreg due to hypotension.  Hypothyroidism - on levothyroxine.  Primary care follow-up  History of hepatitis C -no active issues, outpatient follow-up    Discharge Instructions   Discharge Instructions    (HEART FAILURE  PATIENTS) Call MD:  Anytime you have any of the following symptoms: 1) 3 pound weight gain in 24 hours or 5 pounds in 1 week 2) shortness of breath, with or without a dry hacking cough 3) swelling in the hands, feet or stomach 4) if you have to sleep on extra pillows at night in order to breathe.   Complete by: As directed    Call MD for:  extreme fatigue   Complete by: As directed    Call MD for:  persistant dizziness or light-headedness   Complete by: As directed    Call MD for:  persistant nausea and vomiting   Complete by: As directed    Call MD for:  severe uncontrolled pain   Complete by: As directed    Call MD for:  temperature >100.4   Complete by: As directed    Diet - low sodium heart healthy   Complete by: As directed    Discharge instructions   Complete by: As directed    If you start having frequent or worsening headaches, feeling of pressure in your head, you should come to the emergency department right away to repeat CT scan of your head.  I do not expect this to happen.  Take blood pressure twice daily and write them down to bring to follow up doctor appointments. We had to stop several of your medications because of low BP.  These may be resumed or doses changed in follow up, if your BP can tolerate.  Follow up with cardiology next week.   Increase activity slowly   Complete by: As directed      Allergies as of 07/27/2020      Reactions   Morphine And Related Hives   Levaquin [levofloxacin] Itching   Severe itching; prickly sensation   Other    Latex Rash   Tape Rash      Medication List    STOP taking these medications   aspirin 81 MG chewable tablet   carvedilol 12.5 MG tablet Commonly known as: COREG   Eliquis 5 MG Tabs tablet Generic drug: apixaban   Entresto 49-51 MG Generic drug: sacubitril-valsartan   isosorbide mononitrate 60 MG 24 hr tablet Commonly known as: IMDUR     TAKE these medications   cinacalcet 60 MG tablet Commonly known as:  SENSIPAR Take 60 mg by mouth every evening.   hydrOXYzine 50 MG tablet Commonly known as: ATARAX/VISTARIL Take 50 mg by mouth 2 (two) times daily as needed for itching.   levothyroxine 75 MCG tablet Commonly known as: SYNTHROID Take 75 mcg by mouth daily before breakfast.   lidocaine-prilocaine cream Commonly known as: EMLA APPLY TO ACCESS SITE 30 MINUTES PRIOR TO DIALYSIS   midodrine 10 MG tablet Commonly known as: PROAMATINE Take 10-20 mg by mouth daily. (for low BP associated with dialysis)   oxyCODONE-acetaminophen 5-325 MG tablet Commonly known as: PERCOCET/ROXICET Take 1 tablet by mouth 2 (two) times daily as needed for moderate pain.   pantoprazole 40 MG tablet Commonly known as: PROTONIX Take 40 mg by mouth daily.   ranolazine 500 MG 12 hr tablet Commonly known as: RANEXA Take 500 mg by mouth 2 (two) times daily.   sevelamer carbonate 800 MG tablet Commonly known as:  RENVELA Take 2,400 mg by mouth 3 (three) times daily with meals. And 1 tablet with snacks   Zofran 4 MG tablet Generic drug: ondansetron Take 4 mg by mouth every 4 (four) hours as needed for nausea/vomiting.       Allergies  Allergen Reactions  . Morphine And Related Hives  . Levaquin [Levofloxacin] Itching    Severe itching; prickly sensation  . Other   . Latex Rash  . Tape Rash     If you experience worsening of your admission symptoms, develop shortness of breath, life threatening emergency, suicidal or homicidal thoughts you must seek medical attention immediately by calling 911 or calling your MD immediately  if symptoms less severe.    Please note   You were cared for by a hospitalist during your hospital stay. If you have any questions about your discharge medications or the care you received while you were in the hospital after you are discharged, you can call the unit and asked to speak with the hospitalist on call if the hospitalist that took care of you is not available. Once  you are discharged, your primary care physician will handle any further medical issues. Please note that NO REFILLS for any discharge medications will be authorized once you are discharged, as it is imperative that you return to your primary care physician (or establish a relationship with a primary care physician if you do not have one) for your aftercare needs so that they can reassess your need for medications and monitor your lab values.   Consultations:  Neurosurgery  Nephrology    Procedures/Studies: CT Head Wo Contrast  Result Date: 07/25/2020 CLINICAL DATA:  Syncopal episodes on unconscious by neighbors, head trauma patient has hematoma on back of head. EXAM: CT HEAD WITHOUT CONTRAST TECHNIQUE: Contiguous axial images were obtained from the base of the skull through the vertex without intravenous contrast. COMPARISON:  Head CT same day at 1242 hours FINDINGS: Brain: Unchanged size of the thin subdural hematoma overlying the right cerebral convexity measuring 2-3 mm in greatest thickness (series 4, image 29). No evidence of new intracranial hemorrhage. Mild global parenchymal volume loss and ex vacuo dilatation of ventricular system. Again seen are patchy small to moderate-sized chronic cortically based infarcts in the bilateral frontal, parietal and occipital lobes. Similar burden of advanced chronic small-vessel white matter disease. Chronic right cerebellar infarct. Vascular: No hyperdense vessel.  Atherosclerotic calcification. Skull: Normal. Negative for fracture or focal lesion. Sinuses/Orbits: No acute finding. Other: Right parietal scalp hematoma measuring 4 mm, unchanged. IMPRESSION: 1. Unchanged size of the thin subdural hematoma overlying the right cerebral convexity measuring 2-3 mm in greatest thickness. No evidence of new intracranial hemorrhage. 2. Unchanged right parietal scalp hematoma. Electronically Signed   By: Dahlia Bailiff MD   On: 07/25/2020 19:14   CT HEAD WO  CONTRAST  Result Date: 07/25/2020 CLINICAL DATA:  Head trauma, moderate/severe, syncope with head injury. Dizziness and headache. EXAM: CT HEAD WITHOUT CONTRAST TECHNIQUE: Contiguous axial images were obtained from the base of the skull through the vertex without intravenous contrast. COMPARISON:  Prior head CT 02/06/2020. FINDINGS: Brain: Mild cerebral and cerebellar atrophy. Thin acute subdural hematoma overlying the right cerebral convexity measuring 3 mm in greatest thickness (series 4, image 25). Redemonstrated patchy small to moderate-sized chronic cortically based infarcts within the bilateral frontal, parietal and occipital lobes. Background advanced ill-defined hypoattenuation within the cerebral white matter, nonspecific but compatible with chronic small vessel ischemic disease. Redemonstrated small chronic right cerebellar  infarct. No evidence of intracranial mass. No midline shift. Vascular: No hyperdense vessel.  Atherosclerotic calcifications. Skull: Normal. Negative for fracture or focal lesion. Sinuses/Orbits: Visualized orbits show no acute finding. No significant paranasal sinus disease at the imaged levels. Other: Right parietal scalp hematoma. These results were called by telephone at the time of interpretation on 07/25/2020 at 12:59 pm to provider [facility] , who verbally acknowledged these results. IMPRESSION: Thin acute subdural hematoma overlying the right cerebral convexity, measuring 3 mm in greatest thickness. No significant mass effect. No midline shift. Right parietal scalp hematoma. Stable generalized atrophy of the brain, advanced chronic small vessel ischemic disease and remote cerebral and cerebellar infarcts. Electronically Signed   By: Kellie Simmering DO   On: 07/25/2020 12:59   US Carotid Bilateral  Result Date: 07/26/2020 CLINICAL DATA:  Syncope EXAM: BILATERAL CAROTID DUPLEX ULTRASOUND TECHNIQUE: Pearline Cables scale imaging, color Doppler and duplex ultrasound were performed of  bilateral carotid and vertebral arteries in the neck. COMPARISON:  06/24/2014 FINDINGS: Criteria: Quantification of carotid stenosis is based on velocity parameters that correlate the residual internal carotid diameter with NASCET-based stenosis levels, using the diameter of the distal internal carotid lumen as the denominator for stenosis measurement. The following velocity measurements were obtained: RIGHT ICA: 159/22 cm/sec CCA: 301/60 cm/sec SYSTOLIC ICA/CCA RATIO:  1.2 ECA: 110 cm/sec LEFT ICA: 59/25 cm/sec CCA: 109/32 cm/sec SYSTOLIC ICA/CCA RATIO:  0.5 ECA: 62 cm/sec RIGHT CAROTID ARTERY: Diffuse intimal thickening in the common carotid. Moderate tortuosity. Eccentric partially calcified plaque at the bifurcation and in the proximal ICA with mild stenosis. Normal waveforms and color Doppler signal throughout. RIGHT VERTEBRAL ARTERY:  Normal flow direction and waveform. LEFT CAROTID ARTERY: Mild tortuosity. Somewhat irregular plaque in the bulb extending into proximal internal and external carotid arteries with mild stenosis. Normal waveforms and color Doppler signal. A nonspecific cardiac arrhythmia is noted. LEFT VERTEBRAL ARTERY:  Normal flow direction and waveform. IMPRESSION: 1. Bilateral carotid bifurcation plaque resulting in less than 50% diameter ICA stenosis. 2. Antegrade bilateral vertebral arterial flow. 3. Nonspecific cardiac arrhythmia. Electronically Signed   By: Lucrezia Europe M.D.   On: 07/26/2020 12:37   ECHOCARDIOGRAM COMPLETE  Result Date: 07/26/2020    ECHOCARDIOGRAM REPORT   Patient Name:   [name] [facility] Date of Exam: 07/26/2020 Medical Rec #:  [id number]         Height:       66.0 in Accession #:    [id number]        Weight:       159.3 lb Date of Birth:  1961-02-10          BSA:          1.815 m Patient Age:    89 years          BP:           113/66 mmHg Patient Gender: F                 HR:           66 bpm. Exam Location:  [facility] Procedure: 2D Echo, Color Doppler, Cardiac Doppler and  Strain Analysis Indications:     R55 Syncope  History:         Patient has prior history of Echocardiogram examinations. CHF,                  CAD, Stroke and ESRD; Risk Factors:Hypertension, Dyslipidemia  and Current Smoker.  Sonographer:     Charmayne Sheer RDCS (AE) Referring Phys:  [id number] AMY N COX Diagnosing Phys: Neoma Laming MD  Sonographer Comments: Global longitudinal strain was attempted. IMPRESSIONS  1. Left ventricular ejection fraction, by estimation, is 25 to 30%. The left ventricle has severely decreased function. The left ventricle demonstrates global hypokinesis. The left ventricular internal cavity size was severely dilated. There is moderate  concentric left ventricular hypertrophy. Left ventricular diastolic parameters are consistent with Grade III diastolic dysfunction (restrictive).  2. Right ventricular systolic function is mildly reduced. The right ventricular size is mildly enlarged. Mildly increased right ventricular wall thickness.  3. Left atrial size was severely dilated.  4. Right atrial size was mild to moderately dilated.  5. The mitral valve is degenerative. Mild mitral valve regurgitation.  6. The tricuspid valve is degenerative. Tricuspid valve regurgitation is mild to moderate.  7. The aortic valve is calcified. Aortic valve regurgitation is not visualized. Conclusion(s)/Recommendation(s): Findings consistent with dilated cardiomyopathy. FINDINGS  Left Ventricle: Left ventricular ejection fraction, by estimation, is 25 to 30%. The left ventricle has severely decreased function. The left ventricle demonstrates global hypokinesis. The left ventricular internal cavity size was severely dilated. There is moderate concentric left ventricular hypertrophy. Left ventricular diastolic parameters are consistent with Grade III diastolic dysfunction (restrictive). Right Ventricle: The right ventricular size is mildly enlarged. Mildly increased right ventricular wall thickness.  Right ventricular systolic function is mildly reduced. Left Atrium: Left atrial size was severely dilated. Right Atrium: Right atrial size was mild to moderately dilated. Pericardium: There is no evidence of pericardial effusion. Mitral Valve: The mitral valve is degenerative in appearance. Mild mitral valve regurgitation. MV peak gradient, 4.8 mmHg. The mean mitral valve gradient is 2.0 mmHg. Tricuspid Valve: The tricuspid valve is degenerative in appearance. Tricuspid valve regurgitation is mild to moderate. Aortic Valve: The aortic valve is calcified. Aortic valve regurgitation is not visualized. Aortic valve mean gradient measures 4.0 mmHg. Aortic valve peak gradient measures 7.1 mmHg. Aortic valve area, by VTI measures 2.08 cm. Pulmonic Valve: The pulmonic valve was normal in structure. Pulmonic valve regurgitation is trivial. Aorta: The aortic root was not well visualized. IAS/Shunts: No atrial level shunt detected by color flow Doppler.  LEFT VENTRICLE PLAX 2D LVIDd:         6.10 cm      Diastology LVIDs:         5.30 cm      LV e' lateral:   7.83 cm/s LV PW:         1.00 cm      LV E/e' lateral: 9.6 LV IVS:        0.80 cm LVOT diam:     2.20 cm LV SV:         54 LV SV Index:   30 LVOT Area:     3.80 cm  LV Volumes (MOD) LV vol d, MOD A2C: 184.0 ml LV vol d, MOD A4C: 204.0 ml LV vol s, MOD A2C: 127.0 ml LV vol s, MOD A4C: 124.0 ml LV SV MOD A2C:     57.0 ml LV SV MOD A4C:     204.0 ml LV SV MOD BP:      71.1 ml RIGHT VENTRICLE RV Basal diam:  3.60 cm LEFT ATRIUM             Index       RIGHT ATRIUM           Index  LA diam:        4.50 cm 2.48 cm/m  RA Area:     22.70 cm LA Vol (A2C):   91.8 ml 50.57 ml/m RA Volume:   68.40 ml  37.68 ml/m LA Vol (A4C):   65.9 ml 36.30 ml/m LA Biplane Vol: 79.3 ml 43.68 ml/m  AORTIC VALVE                   PULMONIC VALVE AV Area (Vmax):    2.18 cm    PV Vmax:       0.68 m/s AV Area (Vmean):   2.13 cm    PV Vmean:      47.000 cm/s AV Area (VTI):     2.08 cm    PV VTI:         0.119 m AV Vmax:           133.00 cm/s PV Peak grad:  1.9 mmHg AV Vmean:          94.600 cm/s PV Mean grad:  1.0 mmHg AV VTI:            0.260 m AV Peak Grad:      7.1 mmHg AV Mean Grad:      4.0 mmHg LVOT Vmax:         76.40 cm/s LVOT Vmean:        53.100 cm/s LVOT VTI:          0.142 m LVOT/AV VTI ratio: 0.55  AORTA Ao Root diam: 3.30 cm MITRAL VALVE               TRICUSPID VALVE MV Area (PHT): 5.23 cm    TR Peak grad:   50.7 mmHg MV Area VTI:   1.96 cm    TR Vmax:        356.00 cm/s MV Peak grad:  4.8 mmHg MV Mean grad:  2.0 mmHg    SHUNTS MV Vmax:       1.10 m/s    Systemic VTI:  0.14 m MV Vmean:      71.8 cm/s   Systemic Diam: 2.20 cm MV Decel Time: 145 msec MV E velocity: 75.00 cm/s MV A velocity: 53.60 cm/s MV E/A ratio:  1.40 Neoma Laming MD Electronically signed by Neoma Laming MD Signature Date/Time: 07/26/2020/12:51:32 PM    Final       Dialysis   Subjective: Pt feels well today.  No headache, vision changes, or feeling of pressure in her head.  Anxious to go home.  We talked about her BP meds and watching BP closely at home.   Discharge Exam: Vitals:   07/27/20 0331 07/27/20 0732  BP: 120/86 112/72  Pulse: 72 75  Resp: 17 16  Temp: 98.4 F (36.9 C) (!) 97.4 F (36.3 C)  SpO2:  100%   Vitals:   07/26/20 1815 07/26/20 1942 07/27/20 0331 07/27/20 0732  BP: (!) 119/46 (!) 118/98 120/86 112/72  Pulse:  63 72 75  Resp: 16 16 17 16   Temp:  98.9 F (37.2 C) 98.4 F (36.9 C) (!) 97.4 F (36.3 C)  TempSrc:  Oral  Oral  SpO2:  100%  100%  Weight:   74.5 kg   Height:        General: Pt is alert, awake, not in acute distress Cardiovascular: RRR, S1/S2 +, no rubs, no gallops Respiratory: CTA bilaterally, no wheezing, no rhonchi Abdominal: Soft, NT, ND, bowel sounds + Extremities: no edema, no cyanosis  The results of significant diagnostics from this hospitalization (including imaging, microbiology, ancillary and laboratory) are listed below for reference.      Microbiology: Recent Results (from the past 240 hour(s))  Resp Panel by RT-PCR (Flu A&B, Covid) Nasopharyngeal Swab     Status: None   Collection Time: 07/25/20 12:29 PM   Specimen: Nasopharyngeal Swab; Nasopharyngeal(NP) swabs in vial transport medium  Result Value Ref Range Status   SARS Coronavirus 2 by RT PCR NEGATIVE NEGATIVE Final    Comment: (NOTE) SARS-CoV-2 target nucleic acids are NOT DETECTED.  The SARS-CoV-2 RNA is generally detectable in upper respiratory specimens during the acute phase of infection. The lowest concentration of SARS-CoV-2 viral copies this assay can detect is 138 copies/mL. A negative result does not preclude SARS-Cov-2 infection and should not be used as the sole basis for treatment or other patient management decisions. A negative result may occur with  improper specimen collection/handling, submission of specimen other than nasopharyngeal swab, presence of viral mutation(s) within the areas targeted by this assay, and inadequate number of viral copies(<138 copies/mL). A negative result must be combined with clinical observations, patient history, and epidemiological information. The expected result is Negative.  Fact Sheet for Patients:  EntrepreneurPulse.com.au  Fact Sheet for Healthcare Providers:  IncredibleEmployment.be  This test is no t yet approved or cleared by the [place] FDA and  has been authorized for detection and/or diagnosis of SARS-CoV-2 by FDA under an Emergency Use Authorization (EUA). This EUA will remain  in effect (meaning this test can be used) for the duration of the COVID-19 declaration under Section 564(b)(1) of the Act, 21 U.S.C.section 360bbb-3(b)(1), unless the authorization is terminated  or revoked sooner.       Influenza A by PCR NEGATIVE NEGATIVE Final   Influenza B by PCR NEGATIVE NEGATIVE Final    Comment: (NOTE) The Xpert Xpress SARS-CoV-2/FLU/RSV plus assay is  intended as an aid in the diagnosis of influenza from Nasopharyngeal swab specimens and should not be used as a sole basis for treatment. Nasal washings and aspirates are unacceptable for Xpert Xpress SARS-CoV-2/FLU/RSV testing.  Fact Sheet for Patients: EntrepreneurPulse.com.au  Fact Sheet for Healthcare Providers: IncredibleEmployment.be  This test is not yet approved or cleared by the [place] FDA and has been authorized for detection and/or diagnosis of SARS-CoV-2 by FDA under an Emergency Use Authorization (EUA). This EUA will remain in effect (meaning this test can be used) for the duration of the COVID-19 declaration under Section 564(b)(1) of the Act, 21 U.S.C. section 360bbb-3(b)(1), unless the authorization is terminated or revoked.  Performed at [facility], [address] [place], [place] [address]      Labs: BNP (last 3 results) No results for input(s): BNP in the last 8760 hours. Basic Metabolic Panel: Recent Labs  Lab 07/25/20 1121 07/26/20 0459  NA 139 139  K 4.5 4.0  CL 99 102  CO2 26 23  GLUCOSE 101* 95  BUN 42* 56*  CREATININE 6.49* 7.75*  CALCIUM 8.1* 7.0*  PHOS  --  4.0   Liver Function Tests: Recent Labs  Lab 07/26/20 0459  ALBUMIN 3.1*   No results for input(s): LIPASE, AMYLASE in the last 168 hours. No results for input(s): AMMONIA in the last 168 hours. CBC: Recent Labs  Lab 07/25/20 1121 07/26/20 0459 07/27/20 0528  WBC 3.3* 3.7* 2.9*  HGB 12.2 10.9* 11.3*  HCT 38.3 32.5* 35.0*  MCV 104.4* 101.2* 102.6*  PLT 152 133* 126*   Cardiac  Enzymes: No results for input(s): CKTOTAL, CKMB, CKMBINDEX, TROPONINI in the last 168 hours. BNP: Invalid input(s): POCBNP CBG: Recent Labs  Lab 07/27/20 0437  GLUCAP 94   D-Dimer No results for input(s): DDIMER in the last 72 hours. Hgb A1c No results for input(s): HGBA1C in the last 72 hours. Lipid Profile No results for input(s):  CHOL, HDL, LDLCALC, TRIG, CHOLHDL, LDLDIRECT in the last 72 hours. Thyroid function studies Recent Labs    07/25/20 1827  TSH 0.145*   Anemia work up No results for input(s): VITAMINB12, FOLATE, FERRITIN, TIBC, IRON, RETICCTPCT in the last 72 hours. Urinalysis    Component Value Date/Time   COLORURINE Yellow 09/14/2012 0414   APPEARANCEUR Cloudy 09/14/2012 0414   LABSPEC 1.018 09/14/2012 0414   PHURINE 9.0 09/14/2012 0414   GLUCOSEU 50 mg/dL 09/14/2012 0414   HGBUR 1+ 09/14/2012 0414   BILIRUBINUR Negative 09/14/2012 0414   KETONESUR Negative 09/14/2012 0414   PROTEINUR >=500 09/14/2012 0414   NITRITE Negative 09/14/2012 0414   LEUKOCYTESUR 2+ 09/14/2012 0414   Sepsis Labs Invalid input(s): PROCALCITONIN,  WBC,  LACTICIDVEN Microbiology Recent Results (from the past 240 hour(s))  Resp Panel by RT-PCR (Flu A&B, Covid) Nasopharyngeal Swab     Status: None   Collection Time: 07/25/20 12:29 PM   Specimen: Nasopharyngeal Swab; Nasopharyngeal(NP) swabs in vial transport medium  Result Value Ref Range Status   SARS Coronavirus 2 by RT PCR NEGATIVE NEGATIVE Final    Comment: (NOTE) SARS-CoV-2 target nucleic acids are NOT DETECTED.  The SARS-CoV-2 RNA is generally detectable in upper respiratory specimens during the acute phase of infection. The lowest concentration of SARS-CoV-2 viral copies this assay can detect is 138 copies/mL. A negative result does not preclude SARS-Cov-2 infection and should not be used as the sole basis for treatment or other patient management decisions. A negative result may occur with  improper specimen collection/handling, submission of specimen other than nasopharyngeal swab, presence of viral mutation(s) within the areas targeted by this assay, and inadequate number of viral copies(<138 copies/mL). A negative result must be combined with clinical observations, patient history, and epidemiological information. The expected result is  Negative.  Fact Sheet for Patients:  EntrepreneurPulse.com.au  Fact Sheet for Healthcare Providers:  IncredibleEmployment.be  This test is no t yet approved or cleared by the [place] FDA and  has been authorized for detection and/or diagnosis of SARS-CoV-2 by FDA under an Emergency Use Authorization (EUA). This EUA will remain  in effect (meaning this test can be used) for the duration of the COVID-19 declaration under Section 564(b)(1) of the Act, 21 U.S.C.section 360bbb-3(b)(1), unless the authorization is terminated  or revoked sooner.       Influenza A by PCR NEGATIVE NEGATIVE Final   Influenza B by PCR NEGATIVE NEGATIVE Final    Comment: (NOTE) The Xpert Xpress SARS-CoV-2/FLU/RSV plus assay is intended as an aid in the diagnosis of influenza from Nasopharyngeal swab specimens and should not be used as a sole basis for treatment. Nasal washings and aspirates are unacceptable for Xpert Xpress SARS-CoV-2/FLU/RSV testing.  Fact Sheet for Patients: EntrepreneurPulse.com.au  Fact Sheet for Healthcare Providers: IncredibleEmployment.be  This test is not yet approved or cleared by the [place] FDA and has been authorized for detection and/or diagnosis of SARS-CoV-2 by FDA under an Emergency Use Authorization (EUA). This EUA will remain in effect (meaning this test can be used) for the duration of the COVID-19 declaration under Section 564(b)(1) of the Act, 21 U.S.C. section 360bbb-3(b)(1),  unless the authorization is terminated or revoked.  Performed at [facility], [address] [place], [place] [address]      Time coordinating discharge: Over 30 minutes  SIGNED:   Ezekiel Slocumb, DO [facility] 07/27/2020, 10:57 AM   If 7PM-7AM, please contact night-coverage [email or web address]

## 2020-07-29 ENCOUNTER — Other Ambulatory Visit: Payer: Self-pay | Admitting: Neurosurgery

## 2020-07-29 DIAGNOSIS — S065X9A Traumatic subdural hemorrhage with loss of consciousness of unspecified duration, initial encounter: Secondary | ICD-10-CM

## 2020-07-29 DIAGNOSIS — S065XAA Traumatic subdural hemorrhage with loss of consciousness status unknown, initial encounter: Secondary | ICD-10-CM

## 2020-07-29 LAB — HEPATITIS B SURFACE ANTIBODY, QUANTITATIVE: Hep B S AB Quant (Post): <3.1 m[IU]/mL — ABNORMAL LOW (ref >9.9)

## 2020-07-31 LAB — HEPATITIS B DNA, ULTRAQUANTITATIVE, PCR
HBV DNA SERPL PCR-ACNC: NOT DETECTED IU/mL
HBV DNA SERPL PCR-LOG IU: UNDETERMINED log10 IU/mL

## 2020-08-22 ENCOUNTER — Other Ambulatory Visit: Payer: Self-pay

## 2020-08-22 ENCOUNTER — Ambulatory Visit
Admission: RE | Admit: 2020-08-22 | Discharge: 2020-08-22 | Disposition: A | Discharge status: Home / Self Care | Payer: Medicare Other | Source: Ambulatory Visit | Attending: Neurosurgery | Admitting: Neurosurgery

## 2020-08-22 DIAGNOSIS — S065X9A Traumatic subdural hemorrhage with loss of consciousness of unspecified duration, initial encounter: Secondary | ICD-10-CM | POA: Insufficient documentation

## 2020-08-22 DIAGNOSIS — S065XAA Traumatic subdural hemorrhage with loss of consciousness status unknown, initial encounter: Secondary | ICD-10-CM

## 2020-09-30 ENCOUNTER — Observation Stay
Admission: EM | Admit: 2020-09-30 | Discharge: 2020-10-01 | Disposition: A | Discharge status: Home / Self Care | Payer: Medicare Other | Attending: Emergency Medicine | Admitting: Emergency Medicine

## 2020-09-30 ENCOUNTER — Emergency Department: Payer: Medicare Other

## 2020-09-30 DIAGNOSIS — F1910 Other psychoactive substance abuse, uncomplicated: Secondary | ICD-10-CM | POA: Diagnosis not present

## 2020-09-30 DIAGNOSIS — D638 Anemia in other chronic diseases classified elsewhere: Secondary | ICD-10-CM | POA: Diagnosis present

## 2020-09-30 DIAGNOSIS — Z951 Presence of aortocoronary bypass graft: Secondary | ICD-10-CM | POA: Diagnosis not present

## 2020-09-30 DIAGNOSIS — I2510 Atherosclerotic heart disease of native coronary artery without angina pectoris: Secondary | ICD-10-CM | POA: Diagnosis not present

## 2020-09-30 DIAGNOSIS — E039 Hypothyroidism, unspecified: Secondary | ICD-10-CM | POA: Diagnosis not present

## 2020-09-30 DIAGNOSIS — I480 Paroxysmal atrial fibrillation: Secondary | ICD-10-CM | POA: Diagnosis present

## 2020-09-30 DIAGNOSIS — Z20822 Contact with and (suspected) exposure to covid-19: Secondary | ICD-10-CM | POA: Insufficient documentation

## 2020-09-30 DIAGNOSIS — E875 Hyperkalemia: Principal | ICD-10-CM | POA: Diagnosis present

## 2020-09-30 DIAGNOSIS — I132 Hypertensive heart and chronic kidney disease with heart failure and with stage 5 chronic kidney disease, or end stage renal disease: Secondary | ICD-10-CM | POA: Diagnosis not present

## 2020-09-30 DIAGNOSIS — Z79899 Other long term (current) drug therapy: Secondary | ICD-10-CM | POA: Insufficient documentation

## 2020-09-30 DIAGNOSIS — N186 End stage renal disease: Secondary | ICD-10-CM | POA: Diagnosis not present

## 2020-09-30 DIAGNOSIS — I5042 Chronic combined systolic (congestive) and diastolic (congestive) heart failure: Secondary | ICD-10-CM | POA: Diagnosis not present

## 2020-09-30 DIAGNOSIS — R079 Chest pain, unspecified: Secondary | ICD-10-CM

## 2020-09-30 DIAGNOSIS — R531 Weakness: Secondary | ICD-10-CM | POA: Diagnosis present

## 2020-09-30 DIAGNOSIS — Z992 Dependence on renal dialysis: Secondary | ICD-10-CM | POA: Diagnosis not present

## 2020-09-30 DIAGNOSIS — F0390 Unspecified dementia without behavioral disturbance: Secondary | ICD-10-CM | POA: Diagnosis not present

## 2020-09-30 DIAGNOSIS — F17210 Nicotine dependence, cigarettes, uncomplicated: Secondary | ICD-10-CM | POA: Diagnosis not present

## 2020-09-30 DIAGNOSIS — Z7901 Long term (current) use of anticoagulants: Secondary | ICD-10-CM | POA: Insufficient documentation

## 2020-09-30 DIAGNOSIS — Z8673 Personal history of transient ischemic attack (TIA), and cerebral infarction without residual deficits: Secondary | ICD-10-CM

## 2020-09-30 DIAGNOSIS — Z91040 Latex allergy status: Secondary | ICD-10-CM | POA: Insufficient documentation

## 2020-09-30 DIAGNOSIS — I509 Heart failure, unspecified: Secondary | ICD-10-CM

## 2020-09-30 LAB — CBC WITH DIFFERENTIAL/PLATELET
Abs Immature Granulocytes: 0.02 10*3/uL (ref 0.00–0.07)
Basophils Absolute: 0 10*3/uL (ref 0.0–0.1)
Basophils Relative: 1 %
Eosinophils Absolute: 0.1 10*3/uL (ref 0.0–0.5)
Eosinophils Relative: 1 %
HCT: 40.9 % (ref 36.0–46.0)
Hemoglobin: 13.3 g/dL (ref 12.0–15.0)
Immature Granulocytes: 0 %
Lymphocytes Relative: 27 %
Lymphs Abs: 1.5 10*3/uL (ref 0.7–4.0)
MCH: 33.1 pg (ref 26.0–34.0)
MCHC: 32.5 g/dL (ref 30.0–36.0)
MCV: 101.7 fL — ABNORMAL HIGH (ref 80.0–100.0)
Monocytes Absolute: 0.7 10*3/uL (ref 0.1–1.0)
Monocytes Relative: 12 %
Neutro Abs: 3.3 10*3/uL (ref 1.7–7.7)
Neutrophils Relative %: 59 %
Platelets: 161 10*3/uL (ref 150–400)
RBC: 4.02 MIL/uL (ref 3.87–5.11)
RDW: 14.3 % (ref 11.5–15.5)
WBC: 5.6 10*3/uL (ref 4.0–10.5)
nRBC: 0 % (ref 0.0–0.2)

## 2020-09-30 LAB — BASIC METABOLIC PANEL
Anion gap: 18 — ABNORMAL HIGH (ref 5–15)
Anion gap: 13 (ref 5–15)
BUN: 76 mg/dL — ABNORMAL HIGH (ref 6–20)
BUN: 39 mg/dL — ABNORMAL HIGH (ref 6–20)
CO2: 23 mmol/L (ref 22–32)
CO2: 29 mmol/L (ref 22–32)
Calcium: 7.7 mg/dL — ABNORMAL LOW (ref 8.9–10.3)
Calcium: 7.5 mg/dL — ABNORMAL LOW (ref 8.9–10.3)
Chloride: 92 mmol/L — ABNORMAL LOW (ref 98–111)
Chloride: 94 mmol/L — ABNORMAL LOW (ref 98–111)
Creatinine, Ser: 11.17 mg/dL — ABNORMAL HIGH (ref 0.44–1.00)
Creatinine, Ser: 7.12 mg/dL — ABNORMAL HIGH (ref 0.44–1.00)
GFR, Estimated: 4 mL/min — ABNORMAL LOW (ref >60)
GFR, Estimated: 6 mL/min — ABNORMAL LOW (ref >60)
Glucose, Bld: 81 mg/dL (ref 70–99)
Glucose, Bld: 124 mg/dL — ABNORMAL HIGH (ref 70–99)
Potassium: 6.8 mmol/L (ref 3.5–5.1)
Potassium: 4.2 mmol/L (ref 3.5–5.1)
Sodium: 133 mmol/L — ABNORMAL LOW (ref 135–145)
Sodium: 136 mmol/L (ref 135–145)

## 2020-09-30 LAB — TROPONIN I (HIGH SENSITIVITY)
Troponin I (High Sensitivity): 55 ng/L — ABNORMAL HIGH (ref <18)
Troponin I (High Sensitivity): 56 ng/L — ABNORMAL HIGH (ref <18)

## 2020-09-30 LAB — SARS CORONAVIRUS 2 (TAT 6-24 HRS): SARS Coronavirus 2: NEGATIVE

## 2020-09-30 MED ORDER — DEXTROSE 50 % IV SOLN
1.0000 | Freq: Once | INTRAVENOUS | Status: AC
  Administered 2020-09-30: 50 mL via INTRAVENOUS
  Filled 2020-09-30: qty 50

## 2020-09-30 MED ORDER — ONDANSETRON HCL 4 MG/2ML IJ SOLN
4.0000 mg | Freq: Once | INTRAMUSCULAR | Status: AC
  Administered 2020-09-30: 4 mg via INTRAVENOUS
  Filled 2020-09-30: qty 2

## 2020-09-30 MED ORDER — ONDANSETRON 4 MG PO TBDP: 4.0000 mg | ORAL_TABLET | Freq: Once | ORAL | Status: DC

## 2020-09-30 MED ORDER — LIDOCAINE-PRILOCAINE 2.5-2.5 % EX CREA
TOPICAL_CREAM | Freq: Once | CUTANEOUS | Status: DC
  Filled 2020-09-30: qty 5

## 2020-09-30 MED ORDER — HYDROXYZINE HCL 25 MG PO TABS
50.0000 mg | ORAL_TABLET | Freq: Two times a day (BID) | ORAL | Status: DC | PRN
  Administered 2020-10-01: 50 mg via ORAL
  Filled 2020-09-30: qty 2

## 2020-09-30 MED ORDER — ONDANSETRON HCL 4 MG PO TABS: 4.0000 mg | ORAL_TABLET | Freq: Four times a day (QID) | ORAL | Status: DC | PRN

## 2020-09-30 MED ORDER — HEPARIN SODIUM (PORCINE) 5000 UNIT/ML IJ SOLN
5000.0000 [IU] | Freq: Three times a day (TID) | INTRAMUSCULAR | Status: DC
  Administered 2020-10-01 (×2): 5000 [IU] via SUBCUTANEOUS
  Filled 2020-09-30 (×3): qty 1

## 2020-09-30 MED ORDER — CALCIUM GLUCONATE 10 % IV SOLN
1.0000 g | Freq: Once | INTRAVENOUS | Status: AC
  Administered 2020-09-30: 1 g via INTRAVENOUS
  Filled 2020-09-30: qty 10

## 2020-09-30 MED ORDER — ONDANSETRON HCL 4 MG/2ML IJ SOLN: 4.0000 mg | Freq: Four times a day (QID) | INTRAMUSCULAR | Status: DC | PRN

## 2020-09-30 MED ORDER — SEVELAMER CARBONATE 800 MG PO TABS
800.0000 mg | ORAL_TABLET | ORAL | Status: DC | PRN
  Filled 2020-09-30: qty 1

## 2020-09-30 MED ORDER — ALBUTEROL SULFATE (2.5 MG/3ML) 0.083% IN NEBU
10.0000 mg | INHALATION_SOLUTION | Freq: Once | RESPIRATORY_TRACT | Status: AC
  Administered 2020-09-30: 10 mg via RESPIRATORY_TRACT
  Filled 2020-09-30: qty 12

## 2020-09-30 MED ORDER — CINACALCET HCL 30 MG PO TABS
60.0000 mg | ORAL_TABLET | Freq: Every evening | ORAL | Status: DC
  Administered 2020-09-30: 60 mg via ORAL
  Filled 2020-09-30 (×2): qty 2

## 2020-09-30 MED ORDER — MIDODRINE HCL 5 MG PO TABS
10.0000 mg | ORAL_TABLET | Freq: Every day | ORAL | Status: DC
  Administered 2020-09-30: 20 mg via ORAL
  Administered 2020-10-01: 10 mg via ORAL
  Filled 2020-09-30: qty 2
  Filled 2020-09-30 (×2): qty 4

## 2020-09-30 MED ORDER — LEVOTHYROXINE SODIUM 50 MCG PO TABS
75.0000 ug | ORAL_TABLET | Freq: Every day | ORAL | Status: DC
  Administered 2020-10-01: 75 ug via ORAL
  Filled 2020-09-30: qty 1

## 2020-09-30 MED ORDER — PANTOPRAZOLE SODIUM 40 MG PO TBEC
40.0000 mg | DELAYED_RELEASE_TABLET | Freq: Every day | ORAL | Status: DC
  Administered 2020-09-30 – 2020-10-01 (×2): 40 mg via ORAL
  Filled 2020-09-30 (×2): qty 1

## 2020-09-30 MED ORDER — OXYCODONE-ACETAMINOPHEN 5-325 MG PO TABS: 1.0000 | ORAL_TABLET | Freq: Two times a day (BID) | ORAL | Status: DC | PRN

## 2020-09-30 MED ORDER — RANOLAZINE ER 500 MG PO TB12
500.0000 mg | ORAL_TABLET | Freq: Two times a day (BID) | ORAL | Status: DC
  Administered 2020-09-30 – 2020-10-01 (×2): 500 mg via ORAL
  Filled 2020-09-30 (×3): qty 1

## 2020-09-30 MED ORDER — SEVELAMER CARBONATE 800 MG PO TABS
2400.0000 mg | ORAL_TABLET | Freq: Three times a day (TID) | ORAL | Status: DC
  Administered 2020-10-01: 2400 mg via ORAL
  Filled 2020-09-30 (×6): qty 3

## 2020-09-30 MED ORDER — LEVOTHYROXINE SODIUM 50 MCG PO TABS: 75.0000 ug | ORAL_TABLET | Freq: Every day | ORAL | Status: DC

## 2020-09-30 MED ORDER — INSULIN ASPART 100 UNIT/ML IV SOLN
5.0000 [IU] | Freq: Once | INTRAVENOUS | Status: AC
  Administered 2020-09-30: 5 [IU] via INTRAVENOUS
  Filled 2020-09-30: qty 0.05

## 2020-09-30 NOTE — ED Notes (Signed)
Pt visualized sitting up in bed using personal cell phone at this time. Pt denies further needs.

## 2020-09-30 NOTE — ED Notes (Signed)
Pt states that she took 1 pull of 'something that was supposed to be a cigarette last night, I haven't been sleep, I have thrown up all night. I went to dialysis and told them this and they sent me here, I did not do any treatment and now I have chest pain'.

## 2020-09-30 NOTE — ED Notes (Signed)
Pt given renal diet meal tray at this time.

## 2020-09-30 NOTE — ED Notes (Signed)
Pt back from dialysis at this time.

## 2020-09-30 NOTE — ED Provider Notes (Addendum)
[facility] Emergency Department Provider Note   ____________________________________________   Event Date/Time   First MD Initiated Contact with Patient 09/30/20 [phone]     (approximate)  I have reviewed the triage vital signs and the nursing notes.   HISTORY  Chief Complaint Headache (Pt coming from Dialysis with complaints of weakness, seeing spots, and "feeling shaky after smoking something". Pt repots chest pain that radiates to left shoulder. +nausea. Hx CVA, MI, and triple bypass. )    HPI [name] is a 60 y.o. female with past medical history of hypertension, CAD status post CABG, CHF, atrial fibrillation on Eliquis, ESRD on HD (MWF) hepatitis C, cirrhosis, and chronic pain who presents to the ED for malaise.  Patient reports that she "smoked something" that her neighbor gave her last night.  She states he rolled up a cigarette but then told her he gave her "the wrong one."  She states she has been feeling bad since then with weakness, malaise, nausea, and feeling shaky.  She called the ambulance earlier this morning, states she developed chest pain after that.  She describes it as a sharp pain radiating towards her left shoulder and associated with some mild shortness of breath.  She has not had any fevers or cough, does state that her legs have been swelling since her last run of dialysis 3 days ago.  She is due for dialysis this morning but states staff at her dialysis center told her she needed to come to the ED for evaluation.        Past Medical History:  Diagnosis Date  . Anemia    chronic disease  . CHF (congestive heart failure) ([place])   . Coronary artery disease   . Heart failure ([place])   . Hepatitis    history of hep c  . Hyperlipidemia   . Hypertension   . Myocardial infarction ([place])   . Paroxysmal atrial fibrillation ([facility])   . Renal failure   . Renal insufficiency   . Stroke [facility]) 2011    Patient Active Problem List    Diagnosis Date Noted  . Subdural hematoma ([place]) 07/25/2020  . Syncope and collapse 07/25/2020  . ESRD (end stage renal disease) ([place]) 01/30/2019  . Unstable angina ([place]) 01/23/2019  . Non-STEMI (non-ST elevated myocardial infarction) ([place]) 08/07/2018  . Pneumonia 01/31/2018  . Swelling of limb 09/22/2016  . Kidney dialysis as the cause of abnormal reaction of the patient, or of later complication, without mention of misadventure at the time of the procedure (CODE) 03/24/2016  . Hyperkalemia 11/13/2014  . Generalized weakness 10/15/2014  . Chest pain 10/15/2014  . ESRD on hemodialysis ([place]) 10/15/2014  . Congestive heart failure (CHF) ([place]) 10/15/2014  . HTN (hypertension) 10/15/2014  . Sternal wound dehiscence 04/12/2014  . 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 Pericardial effusion 04/12/2014  . History of delirium 04/12/2014  . H/O atrial flutter 04/12/2014  . Delayed surgical wound healing 04/12/2014  . Chest wall pain following surgery 04/12/2014  . S/P CABG (coronary artery bypass graft) 04/02/2014  . Lactic acidosis 03/26/2014  . Arteriosclerotic dementia ([place]) 03/25/2014  . Atrial flutter by electrocardiogram ([place]) 03/22/2014  . Superficial incisional surgical site infection 03/14/2014  . Postoperative anemia due to acute blood loss 02/27/2014  . Obesity 02/27/2014  . H/O: substance abuse ([place]) 02/27/2014  . Anemia of chronic renal failure 02/27/2014  . Acute postoperative pain 02/27/2014  . Leukocytosis 02/26/2014  . Hypotension 02/24/2014  . Exhausted vascular  access 02/24/2014  . Anemia of chronic disease 02/24/2014  . Stroke ([place]) 02/21/2014  . S/P CABG x 3 02/21/2014  . Dialysis patient ([place]) 02/21/2014  . Hyperlipidemia 02/13/2014  . History of hepatitis C 02/13/2014  . HFrEF (heart failure with reduced ejection fraction) ([place]) 02/13/2014  . H/O stroke without residual deficits 02/13/2014  . ESRD (end stage renal disease) on dialysis ([place]) 02/13/2014  .  End stage renal disease with hypertension ([place]) 02/13/2014  . CAD, multiple vessel 02/13/2014  . Retrosternal chest pain 05/30/2013  . Gallstones 05/29/2013    Past Surgical History:  Procedure Laterality Date  . A/V FISTULAGRAM Left 03/30/2019   Procedure: A/V FISTULAGRAM;  Surgeon: Algernon Huxley, MD;  Location: [facility];  Service: Cardiovascular;  Laterality: Left;  . A/V FISTULAGRAM Left 10/06/2019   Procedure: A/V FISTULAGRAM;  Surgeon: Algernon Huxley, MD;  Location: [facility];  Service: Cardiovascular;  Laterality: Left;  . CORONARY ANGIOPLASTY WITH STENT PLACEMENT  2013  . CORONARY ARTERY BYPASS GRAFT    . DIALYSIS FISTULA CREATION    . FLEXIBLE SIGMOIDOSCOPY N/A 02/23/2019   Procedure: FLEXIBLE SIGMOIDOSCOPY;  Surgeon: Lollie Sails, MD;  Location: [facility] ENDOSCOPY;  Service: Endoscopy;  Laterality: N/A;  . LEFT HEART CATH AND CORS/GRAFTS ANGIOGRAPHY N/A 08/08/2018   Procedure: LEFT HEART CATH AND CORS/GRAFTS ANGIOGRAPHY;  Surgeon: Dionisio David, MD;  Location: [place] [facility];  Service: Cardiovascular;  Laterality: N/A;  . LEFT HEART CATH AND CORS/GRAFTS ANGIOGRAPHY N/A 01/30/2019   Procedure: LEFT HEART CATH AND CORS/GRAFTS ANGIOGRAPHY;  Surgeon: Dionisio David, MD;  Location: [facility];  Service: Cardiovascular;  Laterality: N/A;    Prior to Admission medications   Medication Sig Start Date End Date Taking? Authorizing Provider  cinacalcet (SENSIPAR) 60 MG tablet Take 60 mg by mouth every evening.     [provider]  hydrOXYzine (ATARAX/VISTARIL) 50 MG tablet Take 50 mg by mouth 2 (two) times daily as needed for itching.     [provider]  levothyroxine (SYNTHROID) 75 MCG tablet Take 75 mcg by mouth daily before breakfast.  11/02/18   [provider]  lidocaine-prilocaine (EMLA) cream APPLY TO ACCESS SITE 30 MINUTES PRIOR TO DIALYSIS 04/08/19   [provider]  midodrine (PROAMATINE) 10 MG tablet Take  10-20 mg by mouth daily. (for low BP associated with dialysis)    [provider]  oxyCODONE-acetaminophen (PERCOCET/ROXICET) 5-325 MG tablet Take 1 tablet by mouth 2 (two) times daily as needed for moderate pain.  12/08/18   [provider]  pantoprazole (PROTONIX) 40 MG tablet Take 40 mg by mouth daily.    [provider]  ranolazine (RANEXA) 500 MG 12 hr tablet Take 500 mg by mouth 2 (two) times daily. 02/14/20   [provider]  sevelamer carbonate (RENVELA) 800 MG tablet Take 2,400 mg by mouth 3 (three) times daily with meals. And 1 tablet with snacks    [provider]  ZOFRAN 4 MG tablet Take 4 mg by mouth every 4 (four) hours as needed for nausea/vomiting. 05/20/20   [provider]    Allergies Morphine and related, Levaquin [levofloxacin], Other, Latex, and Tape  Family History  Problem Relation Age of Onset  . Hypertension Other   . Cancer Other   . Renal Disease Other   . Ovarian cancer Mother   . Kidney disease Father   . Breast cancer Maternal Aunt   . Breast cancer Maternal  Grandmother     Social History Social History   Tobacco Use  . Smoking status: Current Some Day Smoker    Packs/day: 0.10    Years: 20.00    Pack years: 2.00    Types: Cigarettes  . Smokeless tobacco: Never Used  . Tobacco comment: smokes one a day  Vaping Use  . Vaping Use: Never used  Substance Use Topics  . Alcohol use: No  . Drug use: No    Review of Systems  Constitutional: No fever/chills.  Positive for weakness and malaise. Eyes: No visual changes. ENT: No sore throat. Cardiovascular: Positive for chest pain. Respiratory: Positive for shortness of breath. Gastrointestinal: No abdominal pain.  Positive for nausea, no vomiting.  No diarrhea.  No constipation. Genitourinary: Negative for dysuria. Musculoskeletal: Negative for back pain. Skin: Negative for rash. Neurological: Negative for headaches, focal weakness or  numbness.  ____________________________________________   PHYSICAL EXAM:  VITAL SIGNS: ED Triage Vitals  Enc Vitals Group     BP 09/30/20 0652 (!) 143/96     Pulse Rate 09/30/20 0652 94     Resp 09/30/20 0652 20     Temp 09/30/20 0652 99.2 F (37.3 C)     Temp Source 09/30/20 0652 Oral     SpO2 09/30/20 0652 94 %     Weight 09/30/20 0653 158 lb (71.7 kg)     Height 09/30/20 0653 5\' 6"  (1.676 m)     Head Circumference --      Peak Flow --      Pain Score --      Pain Loc --      Pain Edu? --      Excl. in [place]? --     Constitutional: Alert and oriented. Eyes: Conjunctivae are normal. Head: Atraumatic. Nose: No congestion/rhinnorhea. Mouth/Throat: Mucous membranes are moist. Neck: Normal ROM Cardiovascular: Normal rate, regular rhythm. Grossly normal heart sounds.  Left upper extremity AV fistula with palpable thrill. Respiratory: Normal respiratory effort.  No retractions. Lungs CTAB. Gastrointestinal: Soft and nontender. No distention. Genitourinary: deferred Musculoskeletal: No lower extremity tenderness, 1+ pitting edema to knees bilaterally. Neurologic:  Normal speech and language. No gross focal neurologic deficits are appreciated. Skin:  Skin is warm, dry and intact. No rash noted. Psychiatric: Mood and affect are normal. Speech and behavior are normal.  ____________________________________________   LABS (all labs ordered are listed, but only abnormal results are displayed)  Labs Reviewed  CBC WITH DIFFERENTIAL/PLATELET - Abnormal; Notable for the following components:      Result Value   MCV 101.7 (*)    All other components within normal limits  BASIC METABOLIC PANEL - Abnormal; Notable for the following components:   Sodium 133 (*)    Potassium 6.8 (*)    Chloride 92 (*)    BUN 76 (*)    Creatinine, Ser 11.17 (*)    Calcium 7.7 (*)    GFR, Estimated 4 (*)    Anion gap 18 (*)    All other components within normal limits  TROPONIN I (HIGH  SENSITIVITY) - Abnormal; Notable for the following components:   Troponin I (High Sensitivity) 55 (*)    All other components within normal limits  SARS CORONAVIRUS 2 (TAT 6-24 HRS)  TROPONIN I (HIGH SENSITIVITY)   ____________________________________________  EKG  ED ECG REPORT I, Blake Divine, the attending physician, personally viewed and interpreted this ECG.   Date: 09/30/2020  EKG Time: 6:55  Rate: 93  Rhythm: normal sinus rhythm  Axis: Normal  Intervals:right bundle branch block  ST&T Change: Anterolateral ST/T changes   PROCEDURES  Procedure(s) performed (including Critical Care):  .Critical Care Performed by: Blake Divine, MD Authorized by: Blake Divine, MD   Critical care provider statement:    Critical care time (minutes):  45   Critical care time was exclusive of:  Separately billable procedures and treating other patients and teaching time   Critical care was necessary to treat or prevent imminent or life-threatening deterioration of the following conditions:  Metabolic crisis and renal failure   Critical care was time spent personally by me on the following activities:  Discussions with consultants, evaluation of patient's response to treatment, examination of patient, ordering and performing treatments and interventions, ordering and review of laboratory studies, ordering and review of radiographic studies, pulse oximetry, re-evaluation of patient's condition, obtaining history from patient or surrogate and review of old charts   I assumed direction of critical care for this patient from another provider in my specialty: no     Care discussed with: admitting provider       ____________________________________________   INITIAL IMPRESSION / ASSESSMENT AND PLAN / ED COURSE       60 year old female with past medical history of hypertension, stroke, CAD status post CABG, atrial fibrillation on Eliquis, CHF, ESRD on HD, hepatitis C, cirrhosis, and  chronic pain who presents to the ED for weakness, malaise, and nausea after smoking something given to her by her neighbor last night.  She developed some sharp pain in her chest with mild difficulty breathing earlier this morning, states it feels different from prior MI.  EKG shows nonspecific ST/T changes and we will screen troponin, but overall symptoms are atypical for ACS.  She is not in any respiratory distress and is maintaining O2 sats on room air.  She is requesting drug screen but does not make urine, drug screen would not change management at this time.  We will check additional labs and chest x-ray, patient does appear mildly fluid overloaded and is due for dialysis today.  Labs remarkable for hyperkalemia with potassium of 6.8, some of her EKG changes could be attributed to this and we will treat with calcium.  We also will treat with insulin and high-dose albuterol.  Case discussed with Dr. Juleen China of nephrology, who will arrange for dialysis this morning.  Troponin mildly elevated although similar to her baseline, low suspicion for ACS at this time and we will trend.  Case discussed with hospitalist for admission.      ____________________________________________   FINAL CLINICAL IMPRESSION(S) / ED DIAGNOSES  Final diagnoses:  Hyperkalemia  Chest pain, unspecified type     ED Discharge Orders    None       Note:  This document was prepared using Dragon voice recognition software and may include unintentional dictation errors.   Blake Divine, MD 09/30/20 [place], [place], MD 09/30/20 [phone]

## 2020-09-30 NOTE — ED Notes (Signed)
Called pharmacy about synthroid and renvela at this time. Per Manuela Schwartz in pharmacy, will start synthroid in AM and will resend a dose of renvela at this time.

## 2020-09-30 NOTE — ED Notes (Signed)
Pt sat up on side of bed to eat renal diet dinner tray at this time. Pt denies further needs at this time, NAD noted.

## 2020-09-30 NOTE — H&P (Signed)
History and Physical    [name] HUD:[id number] DOB: [birth year] DOA: 09/30/2020  PCP: [name], MD   Patient coming from: Home  I have personally briefly reviewed patient's old medical records in [place]  Chief Complaint: Weakness/myalgias  HPI: [name] is a 60 y.o. female with medical history significant for end-stage renal disease on hemodialysis (dialysis days are M/W/F), history of atrial fibrillation on Eliquis, coronary artery disease status post CABG, history of hepatitis C with liver cirrhosis who presents to the ER for evaluation of myalgias, weakness, nausea and feeling unwell. Patient states that she had smoked a cigarette offered  to her by her neighbor.  She states that it did not taste right and when she inquired what was in it, he told that he had given her the wrong one.  Due to the persistence of her symptoms, she was advised by dialysis staff to come to the ER. She complains of chest tightness that was nonradiating associated with shortness of breath.  She completed her dialysis session on Friday and denies any dietary indiscretion. She denies having any emesis, no diarrhea, no constipation, no fever, no chills, no cough, no blurred vision, no dizziness, no lightheadedness, no palpitations, no headache. Labs show sodium 133, potassium 6.8, chloride 92, bicarb 23, glucose 81, BUN 76, creatinine 11, calcium 7.7, troponin 56, white count 5.6, hemoglobin 13.3, hematocrit 40.9, MCV 91.7, RDW 14.3, platelet count 161 Chest x-ray reviewed by me showsows cardiomegaly and pulmonary edema. Renal osteodystrophy. Twelve-lead EKG reviewed by me shows sinus rhythm with a prolonged PR interval, right bundle branch block and T wave changes in the anterior lateral leads.   ED Course: Patient is a 60 year old African-American female who presents to the ER for evaluation of generalized weakness, myalgias, nausea, chest tightness and shortness of breath. Labs  revealed hyperkalemia with EKG changes.  Patient received calcium gluconate, albuterol, dextrose and insulin in the emergency room. She is a dialysis patient dialysis days are Monday/Wednesday/Friday.  She has not missed any treatments. She will be referred to observation status for further evaluation and treatment.   Review of Systems: As per HPI otherwise all other systems reviewed and negative.    Past Medical History:  Diagnosis Date  . Anemia    chronic disease  . CHF (congestive heart failure) ([place])   . Coronary artery disease   . Heart failure ([place])   . Hepatitis    history of hep c  . Hyperlipidemia   . Hypertension   . Myocardial infarction ([place])   . Paroxysmal atrial fibrillation (HCC)   . Renal failure   . Renal insufficiency   . Stroke [facility]) 2011    Past Surgical History:  Procedure Laterality Date  . A/V FISTULAGRAM Left 03/30/2019   Procedure: A/V FISTULAGRAM;  Surgeon: Algernon Huxley, MD;  Location: [place] [facility];  Service: Cardiovascular;  Laterality: Left;  . A/V FISTULAGRAM Left 10/06/2019   Procedure: A/V FISTULAGRAM;  Surgeon: Algernon Huxley, MD;  Location: [place] [facility];  Service: Cardiovascular;  Laterality: Left;  . CORONARY ANGIOPLASTY WITH STENT PLACEMENT  2013  . CORONARY ARTERY BYPASS GRAFT    . DIALYSIS FISTULA CREATION    . FLEXIBLE SIGMOIDOSCOPY N/A 02/23/2019   Procedure: FLEXIBLE SIGMOIDOSCOPY;  Surgeon: Lollie Sails, MD;  Location[facility] ENDOSCOPY;  Service: Endoscopy;  Laterality: N/A;  . LEFT HEART CATH AND CORS/GRAFTS ANGIOGRAPHY N/A 08/08/2018   Procedure: LEFT HEART CATH AND CORS/GRAFTS ANGIOGRAPHY;  Surgeon:  Dionisio David, MD;  Location: [place] [facility];  Service: Cardiovascular;  Laterality: N/A;  . LEFT HEART CATH AND CORS/GRAFTS ANGIOGRAPHY N/A 01/30/2019   Procedure: LEFT HEART CATH AND CORS/GRAFTS ANGIOGRAPHY;  Surgeon: Dionisio David, MD;  Location: [place] [facility];  Service: Cardiovascular;  Laterality: N/A;      reports that she has been smoking cigarettes. She has a 2.00 pack-year smoking history. She has never used smokeless tobacco. She reports that she does not drink alcohol and does not use drugs.  Allergies  Allergen Reactions  . Morphine And Related Hives  . Levaquin [Levofloxacin] Itching    Severe itching; prickly sensation  . Other   . Latex Rash  . Tape Rash    Family History  Problem Relation Age of Onset  . Hypertension Other   . Cancer Other   . Renal Disease Other   . Ovarian cancer Mother   . Kidney disease Father   . Breast cancer Maternal Aunt   . Breast cancer Maternal Grandmother       Prior to Admission medications   Medication Sig Start Date End Date Taking? Authorizing Provider  cinacalcet (SENSIPAR) 60 MG tablet Take 60 mg by mouth every evening.     [provider]  hydrOXYzine (ATARAX/VISTARIL) 50 MG tablet Take 50 mg by mouth 2 (two) times daily as needed for itching.     [provider]  levothyroxine (SYNTHROID) 75 MCG tablet Take 75 mcg by mouth daily before breakfast.  11/02/18   [provider]  lidocaine-prilocaine (EMLA) cream APPLY TO ACCESS SITE 30 MINUTES PRIOR TO DIALYSIS 04/08/19   [provider]  midodrine (PROAMATINE) 10 MG tablet Take 10-20 mg by mouth daily. (for low BP associated with dialysis)    [provider]  oxyCODONE-acetaminophen (PERCOCET/ROXICET) 5-325 MG tablet Take 1 tablet by mouth 2 (two) times daily as needed for moderate pain.  12/08/18   [provider]  pantoprazole (PROTONIX) 40 MG tablet Take 40 mg by mouth daily.    [provider]  ranolazine (RANEXA) 500 MG 12 hr tablet Take 500 mg by mouth 2 (two) times daily. 02/14/20   [provider]  sevelamer carbonate (RENVELA) 800 MG tablet Take 2,400 mg by mouth 3 (three) times daily with meals. And 1 tablet with snacks    [provider]  ZOFRAN 4 MG tablet Take 4 mg by mouth every 4 (four) hours  as needed for nausea/vomiting. 05/20/20   [provider]    Physical Exam: Vitals:   09/30/20 1130 09/30/20 1145 09/30/20 1200 09/30/20 1215  BP: 112/84 (!) 118/93 105/81 (!) 109/55  Pulse: 71 71 78 66  Resp: 16 11  14   Temp:      TempSrc:      SpO2:      Weight:      Height:         Vitals:   09/30/20 1130 09/30/20 1145 09/30/20 1200 09/30/20 1215  BP: 112/84 (!) 118/93 105/81 (!) 109/55  Pulse: 71 71 78 66  Resp: 16 11  14   Temp:      TempSrc:      SpO2:      Weight:      Height:          Constitutional: Alert and oriented x 3 . Not in any apparent distress HEENT:      Head: Normocephalic and atraumatic.         Eyes: PERLA, EOMI,  Conjunctivae pallor. Sclera is non-icteric.       Mouth/Throat: Mucous membranes are moist.       Neck: Supple with no signs of meningismus. Cardiovascular: Regular rate and rhythm. No murmurs, gallops, or rubs. 2+ symmetrical distal pulses are present . No JVD. No LE edema Respiratory: Respiratory effort normal .bilateral air entry. No wheezes, crackles, or rhonchi.  Gastrointestinal: Soft, non tender, and non distended with positive bowel sounds.  Genitourinary: No CVA tenderness. Musculoskeletal: Nontender with normal range of motion in all extremities. No cyanosis, or erythema of extremities. Neurologic:  Face is symmetric. Moving all extremities. No gross focal neurologic deficits  Skin: Skin is warm, dry.  No rash or ulcers Psychiatric: Mood and affect are normal   Labs on Admission: I have personally reviewed following labs and imaging studies  CBC: Recent Labs  Lab 09/30/20 0722  WBC 5.6  NEUTROABS 3.3  HGB 13.3  HCT 40.9  MCV 101.7*  PLT 765   Basic Metabolic Panel: Recent Labs  Lab 09/30/20 0722  NA 133*  K 6.8*  CL 92*  CO2 23  GLUCOSE 81  BUN 76*  CREATININE 11.17*  CALCIUM 7.7*   GFR: Estimated Creatinine Clearance: 5.6 mL/min (A) (by C-G formula based on SCr of 11.17 mg/dL (H)). Liver  Function Tests: No results for input(s): AST, ALT, ALKPHOS, BILITOT, PROT, ALBUMIN in the last 168 hours. No results for input(s): LIPASE, AMYLASE in the last 168 hours. No results for input(s): AMMONIA in the last 168 hours. Coagulation Profile: No results for input(s): INR, PROTIME in the last 168 hours. Cardiac Enzymes: No results for input(s): CKTOTAL, CKMB, CKMBINDEX, TROPONINI in the last 168 hours. BNP (last 3 results) No results for input(s): PROBNP in the last 8760 hours. HbA1C: No results for input(s): HGBA1C in the last 72 hours. CBG: No results for input(s): GLUCAP in the last 168 hours. Lipid Profile: No results for input(s): CHOL, HDL, LDLCALC, TRIG, CHOLHDL, LDLDIRECT in the last 72 hours. Thyroid Function Tests: No results for input(s): TSH, T4TOTAL, FREET4, T3FREE, THYROIDAB in the last 72 hours. Anemia Panel: No results for input(s): VITAMINB12, FOLATE, FERRITIN, TIBC, IRON, RETICCTPCT in the last 72 hours. Urine analysis:    Component Value Date/Time   COLORURINE Yellow 09/14/2012 0414   APPEARANCEUR Cloudy 09/14/2012 0414   LABSPEC 1.018 09/14/2012 0414   PHURINE 9.0 09/14/2012 0414   GLUCOSEU 50 mg/dL 09/14/2012 0414   HGBUR 1+ 09/14/2012 0414   BILIRUBINUR Negative 09/14/2012 0414   KETONESUR Negative 09/14/2012 0414   PROTEINUR >=500 09/14/2012 0414   NITRITE Negative 09/14/2012 0414   LEUKOCYTESUR 2+ 09/14/2012 0414    Radiological Exams on Admission: DG Chest 2 View  Result Date: 09/30/2020 CLINICAL DATA:  Chest pain EXAM: CHEST - 2 VIEW COMPARISON:  02/06/2020 FINDINGS: Diffuse interstitial opacity with Kerley lines. No effusion or pneumothorax. Chronic marked cardiomegaly. There is aortic and coronary atherosclerosis. Prior CABG. Stent in the left arm which is likely dialysis related. Sclerotic appearance of endplates attributed to renal osteodystrophy. IMPRESSION: 1. Cardiomegaly and pulmonary edema. 2. Renal osteodystrophy. Electronically Signed    By: Monte Fantasia M.D.   On: 09/30/2020 07:19     Assessment/Plan Principal Problem:   Hyperkalemia Active Problems:   CAD, multiple vessel   Congestive heart failure (CHF) (HCC)   H/O stroke without residual deficits   Anemia of chronic disease   ESRD (end stage renal disease) (HCC)   Paroxysmal atrial fibrillation (HCC)   Hypothyroidism  Hyperkalemia Symptomatic with EKG changes in a patient with a history of end-stage renal disease on hemodialysis Patient received calcium gluconate IV, insulin, dextrose and albuterol She is scheduled for emergent hemodialysis today Appreciate nephrology     End-stage renal disease Dialysis days are M/W/F Consult nephrology for renal placement therapy   Paroxysmal atrial fibrillation Rate controlled Not on anticoagulation due to bleeding    Hypothyroidism Continue Synthroid   Coronary artery disease status post CABG Continue Ranexa   DVT prophylaxis: Heparin Code Status: full code Family Communication: Greater than 50% of time was spent discussing plan of care with patient at the bedside.  All questions and concerns have been addressed.  She verbalizes understanding and agrees with the plan. Disposition Plan: Back to previous home environment Consults called: Nephrology Status: Observation    Kohle Winner MD [facility]     09/30/2020, 12:44 PM

## 2020-09-30 NOTE — ED Notes (Signed)
Report given to this RN by Lorriane Shire RN in dialysis. Told this RN that transport was called and will be taking pt back to [facility]

## 2020-09-30 NOTE — ED Provider Notes (Signed)
MSE was initiated and I personally evaluated the patient and placed orders (if any) at  6:38 AM on September 30, 2020.  The patient appears stable so that the remainder of the MSE may be completed by another provider.  Patient here with generalized weakness, feeling shaky, seeing spots and having left-sided chest pain after smoking a cigarette that someone rolled last night.  She states that she has had a previous cardiac history with bypass but states this does not feel like her prior heart attacks.  Hemodynamically stable with EMS.  No focal neurologic deficits on exam.  Requesting a drug screen today but no longer makes urine as she is end-stage renal on hemodialysis Monday, Wednesday and Friday.  No recent missed dialysis but has dialysis scheduled today.   [name], [name], DO 09/30/20 [phone]

## 2020-09-30 NOTE — Progress Notes (Signed)
[facility]  ROUNDING NOTE   Subjective:   [name] was admitted to [facility] on 09/30/2020 for Hyperkalemia [E87.5]  Last hemodialysis treatment Friday 4/15.   Patient presented to her dialysis center for her regularly scheduled dialysis treatment this morning. But she states she smoked illicit substances with her neighbor and now feeling shaky, weak and blurry vision. She was sent to the ED where her K was elevated. Nephrology consulted for urgent dialysis need.     Objective:  Vital signs in last 24 hours:  Temp:  [97.9 F (36.6 C)-99.2 F (37.3 C)] 97.9 F (36.6 C) (04/18 1000) Pulse Rate:  [68-94] 78 (04/18 1200) Resp:  [11-23] 11 (04/18 1145) BP: (102-155)/(61-96) 105/81 (04/18 1200) SpO2:  [94 %-100 %] 100 % (04/18 0900) Weight:  [71.7 kg] 71.7 kg (04/18 0653)  Weight change:  Filed Weights   09/30/20 0653  Weight: 71.7 kg    Intake/Output: No intake/output data recorded.   Intake/Output this shift:  No intake/output data recorded.  Physical Exam: General: NAD, sitting up in stretcher  Head: Normocephalic, atraumatic. Moist oral mucosal membranes  Eyes: Anicteric, PERRL  Neck: Supple, trachea midline  Lungs:  Clear to auscultation  Heart: Regular rate and rhythm  Abdomen:  Soft, nontender,   Extremities: no peripheral edema.  Neurologic: Nonfocal, moving all four extremities  Skin: No lesions  Access: Left AVF    Basic Metabolic Panel: Recent Labs  Lab 09/30/20 0722  NA 133*  K 6.8*  CL 92*  CO2 23  GLUCOSE 81  BUN 76*  CREATININE 11.17*  CALCIUM 7.7*    Liver Function Tests: No results for input(s): AST, ALT, ALKPHOS, BILITOT, PROT, ALBUMIN in the last 168 hours. No results for input(s): LIPASE, AMYLASE in the last 168 hours. No results for input(s): AMMONIA in the last 168 hours.  CBC: Recent Labs  Lab 09/30/20 0722  WBC 5.6  NEUTROABS 3.3  HGB 13.3  HCT 40.9  MCV 101.7*  PLT 161    Cardiac Enzymes: No  results for input(s): CKTOTAL, CKMB, CKMBINDEX, TROPONINI in the last 168 hours.  BNP: Invalid input(s): POCBNP  CBG: No results for input(s): GLUCAP in the last 168 hours.  Microbiology: Results for orders placed or performed during the hospital encounter of 07/25/20  Resp Panel by RT-PCR (Flu A&B, Covid) Nasopharyngeal Swab     Status: None   Collection Time: 07/25/20 12:29 PM   Specimen: Nasopharyngeal Swab; Nasopharyngeal(NP) swabs in vial transport medium  Result Value Ref Range Status   SARS Coronavirus 2 by RT PCR NEGATIVE NEGATIVE Final    Comment: (NOTE) SARS-CoV-2 target nucleic acids are NOT DETECTED.  The SARS-CoV-2 RNA is generally detectable in upper respiratory specimens during the acute phase of infection. The lowest concentration of SARS-CoV-2 viral copies this assay can detect is 138 copies/mL. A negative result does not preclude SARS-Cov-2 infection and should not be used as the sole basis for treatment or other patient management decisions. A negative result may occur with  improper specimen collection/handling, submission of specimen other than nasopharyngeal swab, presence of viral mutation(s) within the areas targeted by this assay, and inadequate number of viral copies(<138 copies/mL). A negative result must be combined with clinical observations, patient history, and epidemiological information. The expected result is Negative.  Fact Sheet for Patients:  EntrepreneurPulse.com.au  Fact Sheet for Healthcare Providers:  IncredibleEmployment.be  This test is no t yet approved or cleared by the [place] FDA and  has been  authorized for detection and/or diagnosis of SARS-CoV-2 by FDA under an Emergency Use Authorization (EUA). This EUA will remain  in effect (meaning this test can be used) for the duration of the COVID-19 declaration under Section 564(b)(1) of the Act, 21 U.S.C.section 360bbb-3(b)(1), unless the  authorization is terminated  or revoked sooner.       Influenza A by PCR NEGATIVE NEGATIVE Final   Influenza B by PCR NEGATIVE NEGATIVE Final    Comment: (NOTE) The Xpert Xpress SARS-CoV-2/FLU/RSV plus assay is intended as an aid in the diagnosis of influenza from Nasopharyngeal swab specimens and should not be used as a sole basis for treatment. Nasal washings and aspirates are unacceptable for Xpert Xpress SARS-CoV-2/FLU/RSV testing.  Fact Sheet for Patients: EntrepreneurPulse.com.au  Fact Sheet for Healthcare Providers: IncredibleEmployment.be  This test is not yet approved or cleared by the [place] FDA and has been authorized for detection and/or diagnosis of SARS-CoV-2 by FDA under an Emergency Use Authorization (EUA). This EUA will remain in effect (meaning this test can be used) for the duration of the COVID-19 declaration under Section 564(b)(1) of the Act, 21 U.S.C. section 360bbb-3(b)(1), unless the authorization is terminated or revoked.  Performed at [facility], [address] [place], [place] [address]     Coagulation Studies: No results for input(s): LABPROT, INR in the last 72 hours.  Urinalysis: No results for input(s): COLORURINE, LABSPEC, PHURINE, GLUCOSEU, HGBUR, BILIRUBINUR, KETONESUR, PROTEINUR, UROBILINOGEN, NITRITE, LEUKOCYTESUR in the last 72 hours.  Invalid input(s): APPERANCEUR    Imaging: DG Chest 2 View  Result Date: 09/30/2020 CLINICAL DATA:  Chest pain EXAM: CHEST - 2 VIEW COMPARISON:  02/06/2020 FINDINGS: Diffuse interstitial opacity with Kerley lines. No effusion or pneumothorax. Chronic marked cardiomegaly. There is aortic and coronary atherosclerosis. Prior CABG. Stent in the left arm which is likely dialysis related. Sclerotic appearance of endplates attributed to renal osteodystrophy. IMPRESSION: 1. Cardiomegaly and pulmonary edema. 2. Renal osteodystrophy. Electronically Signed    By: Monte Fantasia M.D.   On: 09/30/2020 07:19     Medications:       Assessment/ Plan:  [name] is a 60 y.o. black female with end stage renal disease on hemodialysis, hypertension, coronary artery disease status post CABG, congestive heart failure, atrial fibrillation, chronic hepatitis C, chronic pain who is admitted to [facility] on 09/30/2020 for Hyperkalemia [E87.5]  [facility] Left AVF 71kg  1. End stage renal disease with hyperkalemia: placed on emergent hemodialysis treatment today. Seen and examined on treatment. Tolerating treatment well.  - Patient instructed to take her outpatient V[place] on nondialysis days. She has the medication at home but has not yet started taking this.   2. Hypotension: blood pressure at goal during treatment.  - Continue midodrine  3. Anemia with chronic kidney disease: hemoglobin at goal, 13.3, macrocytic.  - Continue outpatient ESA  4. Secondary Hyperparathyroidism: with hypocalcemia.  - hold cinacalcet - sevelamer with meals.    LOS: 0 Fed Ceci 4/18/202212:12 PM

## 2020-09-30 NOTE — ED Notes (Signed)
Pt noted to be resting comfortably with eyes closed at this time. Pt easily aroused at this time and denies further needs.

## 2020-09-30 NOTE — ED Notes (Signed)
Pt given warm blanket at this time. Pt noted to be resting in bed comfortably with eyes closed, easily aroused at this time. Pt denies further needs.

## 2020-10-01 ENCOUNTER — Encounter: Payer: Self-pay | Admitting: Internal Medicine

## 2020-10-01 ENCOUNTER — Other Ambulatory Visit: Payer: Self-pay

## 2020-10-01 DIAGNOSIS — E875 Hyperkalemia: Secondary | ICD-10-CM | POA: Diagnosis not present

## 2020-10-01 DIAGNOSIS — N186 End stage renal disease: Secondary | ICD-10-CM

## 2020-10-01 DIAGNOSIS — I480 Paroxysmal atrial fibrillation: Secondary | ICD-10-CM | POA: Diagnosis not present

## 2020-10-01 LAB — CBC
HCT: 39.5 % (ref 36.0–46.0)
Hemoglobin: 12.8 g/dL (ref 12.0–15.0)
MCH: 33.5 pg (ref 26.0–34.0)
MCHC: 32.4 g/dL (ref 30.0–36.0)
MCV: 103.4 fL — ABNORMAL HIGH (ref 80.0–100.0)
Platelets: 139 10*3/uL — ABNORMAL LOW (ref 150–400)
RBC: 3.82 MIL/uL — ABNORMAL LOW (ref 3.87–5.11)
RDW: 14.4 % (ref 11.5–15.5)
WBC: 3.2 10*3/uL — ABNORMAL LOW (ref 4.0–10.5)
nRBC: 0 % (ref 0.0–0.2)

## 2020-10-01 LAB — BASIC METABOLIC PANEL
Anion gap: 15 (ref 5–15)
BUN: 46 mg/dL — ABNORMAL HIGH (ref 6–20)
CO2: 27 mmol/L (ref 22–32)
Calcium: 7 mg/dL — ABNORMAL LOW (ref 8.9–10.3)
Chloride: 94 mmol/L — ABNORMAL LOW (ref 98–111)
Creatinine, Ser: 7.91 mg/dL — ABNORMAL HIGH (ref 0.44–1.00)
GFR, Estimated: 5 mL/min — ABNORMAL LOW (ref >60)
Glucose, Bld: 160 mg/dL — ABNORMAL HIGH (ref 70–99)
Potassium: 5.1 mmol/L (ref 3.5–5.1)
Sodium: 136 mmol/L (ref 135–145)

## 2020-10-01 NOTE — Discharge Summary (Addendum)
Physician Discharge Summary  [name] EXB:[id number] DOB: 05/11/1961 DOA: 09/30/2020  PCP: [name], MD  Admit date: 09/30/2020 Discharge date: 10/01/2020  Discharge disposition: Home   Recommendations for Outpatient Follow-Up:   Follow-up with PCP in 1 to 2 weeks Follow-up with nephrologist for outpatient hemodialysis on Mondays, Wednesdays and Fridays.   Discharge Diagnosis:   Principal Problem:   Hyperkalemia Active Problems:   Congestive heart failure (CHF) (HCC)   H/O stroke without residual deficits   CAD, multiple vessel   Anemia of chronic disease   ESRD (end stage renal disease) (HCC)   Paroxysmal atrial fibrillation ([place])   Hypothyroidism    Discharge Condition: Stable.  Diet recommendation:  Diet Order            Diet renal 60/70-07-17-1198           Diet renal with fluid restriction Fluid restriction: 1200 mL Fluid; Room service appropriate? Yes; Fluid consistency: Thin  Diet effective now                   Code Status: Full Code     Hospital Course:   [name] is a 60 year old woman with medical history significant for ESRD on hemodialysis on Mondays Wednesdays and Fridays, stroke, atrial fibrillation on Eliquis, chronic systolic and diastolic CHF with EF of 25 to 30%, CAD s/p CABG, hepatitis C with liver cirrhosis, chronic hypoxic respiratory failure on 2 L/min oxygen, who presented to the hospital because of generalized weakness, nausea, myalgia and malaise.  She was found to have hyperkalemia with a potassium of 6.8 and EKG changes with peaked T waves.  She was seen by the nephrologist and she underwent emergent hemodialysis.  Potassium level has improved to 5.1.  All her symptoms have resolved.  Case was discussed with nephrologist, Dr. Juleen China.  From his standpoint, patient is okay for discharge.    Medical Consultants:    Nephrologist, Dr. Juleen China   Discharge Exam:    Vitals:   10/01/20 0155 10/01/20 0759  10/01/20 1145 10/01/20 1200  BP: 97/78 125/79 120/77   Pulse: 70 76 70   Resp: 16 18 18    Temp: 98.2 F (36.8 C) 98.2 F (36.8 C)    TempSrc: Oral     SpO2: 100% 97% (!) 87% 96%  Weight:      Height:         GEN: NAD SKIN: No rash EYES: EOMI ENT: MMM CV: RRR PULM: CTA B ABD: soft, ND, NT, +BS CNS: AAO x 3, non focal EXT: No edema or tenderness   The results of significant diagnostics from this hospitalization (including imaging, microbiology, ancillary and laboratory) are listed below for reference.     Procedures and Diagnostic Studies:   DG Chest 2 View  Result Date: 09/30/2020 CLINICAL DATA:  Chest pain EXAM: CHEST - 2 VIEW COMPARISON:  02/06/2020 FINDINGS: Diffuse interstitial opacity with Awanda Mink lines. No effusion or pneumothorax. Chronic marked cardiomegaly. There is aortic and coronary atherosclerosis. Prior CABG. Stent in the left arm which is likely dialysis related. Sclerotic appearance of endplates attributed to renal osteodystrophy. IMPRESSION: 1. Cardiomegaly and pulmonary edema. 2. Renal osteodystrophy. Electronically Signed   By: Monte Fantasia M.D.   On: 09/30/2020 07:19     Labs:   Basic Metabolic Panel: Recent Labs  Lab 09/30/20 0722 09/30/20 1700 10/01/20 0533  NA 133* 136 136  K 6.8* 4.2 5.1  CL 92* 94* 94*  CO2 23 29 27  GLUCOSE 81 124* 160*  BUN 76* 39* 46*  CREATININE 11.17* 7.12* 7.91*  CALCIUM 7.7* 7.5* 7.0*   GFR Estimated Creatinine Clearance: 8 mL/min (A) (by C-G formula based on SCr of 7.91 mg/dL (H)). Liver Function Tests: No results for input(s): AST, ALT, ALKPHOS, BILITOT, PROT, ALBUMIN in the last 168 hours. No results for input(s): LIPASE, AMYLASE in the last 168 hours. No results for input(s): AMMONIA in the last 168 hours. Coagulation profile No results for input(s): INR, PROTIME in the last 168 hours.  CBC: Recent Labs  Lab 09/30/20 0722 10/01/20 0533  WBC 5.6 3.2*  NEUTROABS 3.3  --   HGB 13.3 12.8  HCT 40.9  39.5  MCV 101.7* 103.4*  PLT 161 139*   Cardiac Enzymes: No results for input(s): CKTOTAL, CKMB, CKMBINDEX, TROPONINI in the last 168 hours. BNP: Invalid input(s): POCBNP CBG: No results for input(s): GLUCAP in the last 168 hours. D-Dimer No results for input(s): DDIMER in the last 72 hours. Hgb A1c No results for input(s): HGBA1C in the last 72 hours. Lipid Profile No results for input(s): CHOL, HDL, LDLCALC, TRIG, CHOLHDL, LDLDIRECT in the last 72 hours. Thyroid function studies No results for input(s): TSH, T4TOTAL, T3FREE, THYROIDAB in the last 72 hours.  Invalid input(s): FREET3 Anemia work up No results for input(s): VITAMINB12, FOLATE, FERRITIN, TIBC, IRON, RETICCTPCT in the last 72 hours. Microbiology Recent Results (from the past 240 hour(s))  SARS CORONAVIRUS 2 (TAT 6-24 HRS) Nasopharyngeal Nasopharyngeal Swab     Status: None   Collection Time: 09/30/20  8:57 AM   Specimen: Nasopharyngeal Swab  Result Value Ref Range Status   SARS Coronavirus 2 NEGATIVE NEGATIVE Final    Comment: (NOTE) SARS-CoV-2 target nucleic acids are NOT DETECTED.  The SARS-CoV-2 RNA is generally detectable in upper and lower respiratory specimens during the acute phase of infection. Negative results do not preclude SARS-CoV-2 infection, do not rule out co-infections with other pathogens, and should not be used as the sole basis for treatment or other patient management decisions. Negative results must be combined with clinical observations, patient history, and epidemiological information. The expected result is Negative.  Fact Sheet for Patients: SugarRoll.be  Fact Sheet for Healthcare Providers: https://www.woods-mathews.com/  This test is not yet approved or cleared by the [place] FDA and  has been authorized for detection and/or diagnosis of SARS-CoV-2 by FDA under an Emergency Use Authorization (EUA). This EUA will remain  in effect  (meaning this test can be used) for the duration of the COVID-19 declaration under Se ction 564(b)(1) of the Act, 21 U.S.C. section 360bbb-3(b)(1), unless the authorization is terminated or revoked sooner.  Performed at [facility], [place] [address] [place], [place] [address]      Discharge Instructions:   Discharge Instructions    Diet renal 60/70-07-17-1198   Complete by: As directed    Discharge instructions   Complete by: As directed    Go to the outpatient hemodialysis center for hemodialysis as scheduled on Mondays, Wednesdays and Fridays.   Increase activity slowly   Complete by: As directed      Allergies as of 10/01/2020      Reactions   Morphine And Related Hives   Levaquin [levofloxacin] Itching   Severe itching; prickly sensation   Other    Latex Rash   Tape Rash      Medication List    TAKE these medications   amiodarone 200 MG tablet Commonly known as: PACERONE Take 200  mg by mouth daily.   carvedilol 6.25 MG tablet Commonly known as: COREG Take 6.25 mg by mouth 2 (two) times daily.   cinacalcet 90 MG tablet Commonly known as: SENSIPAR Take 90 mg by mouth daily.   Eliquis 5 MG Tabs tablet Generic drug: apixaban Per instructions TWICE DAILY  (route: oral)   Entresto 24-26 MG Generic drug: sacubitril-valsartan Per instructions TWICE DAILY  (route: oral)   hydrOXYzine 50 MG tablet Commonly known as: ATARAX/VISTARIL Take 50 mg by mouth 2 (two) times daily as needed for itching.   levothyroxine 75 MCG tablet Commonly known as: SYNTHROID Take 75 mcg by mouth daily before breakfast.   lidocaine-prilocaine cream Commonly known as: EMLA APPLY TO ACCESS SITE 30 MINUTES PRIOR TO DIALYSIS   midodrine 10 MG tablet Commonly known as: PROAMATINE Take 10-20 mg by mouth daily. (for low BP associated with dialysis)   oxyCODONE-acetaminophen 5-325 MG tablet Commonly known as: PERCOCET/ROXICET Take 1 tablet by mouth 2 (two) times daily as needed  for moderate pain.   pantoprazole 40 MG tablet Commonly known as: PROTONIX Take 40 mg by mouth daily.   ranolazine 500 MG 12 hr tablet Commonly known as: RANEXA Take 500 mg by mouth 2 (two) times daily.   rosuvastatin 20 MG tablet Commonly known as: CRESTOR Take 20 mg by mouth at bedtime. What changed: Another medication with the same name was removed. Continue taking this medication, and follow the directions you see here.   sevelamer carbonate 800 MG tablet Commonly known as: RENVELA Take 2,400 mg by mouth 3 (three) times daily with meals. And 1 tablet with snacks   Zofran 4 MG tablet Generic drug: ondansetron Take 4 mg by mouth every 4 (four) hours as needed for nausea/vomiting.         Time coordinating discharge: 32 minutes  Signed:  Jerrie Schussler  [facility] 10/01/2020, 2:14 PM   Pager on [email or web address]. If 7PM-7AM, please contact night-coverage at [email or web address]

## 2020-10-01 NOTE — Progress Notes (Signed)
SATURATION QUALIFICATIONS: (This note is used to comply with regulatory documentation for home oxygen)  Patient Saturations on Room Air at Rest =87%  Patient Saturations on Room Air while Ambulating = na%  Patient Saturations on 2 Liters of oxygen while Ambulating = 96%  Please briefly explain why patient needs home oxygen:

## 2020-10-01 NOTE — Plan of Care (Signed)
Pt. Arrived from ED around 0130. Pt. Alert and oriented. No sign of distress. Hemodialysis MWF. Dialysis 4/18, 2.5 fluid removed. Left upper arm Fistula present with positive bruit and thrill. VS stable. Pt. On 2L O2 via . Continue to monitor

## 2020-10-01 NOTE — Progress Notes (Signed)
[facility]  ROUNDING NOTE   Subjective:   [name] was admitted to [facility] on 09/30/2020 for Hyperkalemia [E87.5] Chest pain, unspecified type [R07.9]  Patient seen resting in bed,  Alert and oriented Says she feels a lot better than yesterday She's asking if she can be discharged  Objective:  Vital signs in last 24 hours:  Temp:  [98.2 F (36.8 C)] 98.2 F (36.8 C) (04/19 0759) Pulse Rate:  [68-76] 70 (04/19 1145) Resp:  [14-18] 18 (04/19 1145) BP: (97-125)/(46-81) 120/77 (04/19 1145) SpO2:  [87 %-100 %] 87 % (04/19 1145)  Weight change:  Filed Weights   09/30/20 0653  Weight: 71.7 kg    Intake/Output: I/O last 3 completed shifts: In: -  Out: 1913 [Other:1913]   Intake/Output this shift:  No intake/output data recorded.  Physical Exam: General: NAD, laying in bed  Head: Normocephalic, atraumatic. Moist oral mucosal membranes  Eyes: Anicteric  Lungs:  Clear to auscultation  Heart: Regular rate and rhythm  Abdomen:  Soft, nontender,   Extremities: no peripheral edema.  Neurologic: Nonfocal, moving all four extremities  Skin: No lesions  Access: Left AVF    Basic Metabolic Panel: Recent Labs  Lab 09/30/20 0722 09/30/20 1700 10/01/20 0533  NA 133* 136 136  K 6.8* 4.2 5.1  CL 92* 94* 94*  CO2 23 29 27   GLUCOSE 81 124* 160*  BUN 76* 39* 46*  CREATININE 11.17* 7.12* 7.91*  CALCIUM 7.7* 7.5* 7.0*    Liver Function Tests: No results for input(s): AST, ALT, ALKPHOS, BILITOT, PROT, ALBUMIN in the last 168 hours. No results for input(s): LIPASE, AMYLASE in the last 168 hours. No results for input(s): AMMONIA in the last 168 hours.  CBC: Recent Labs  Lab 09/30/20 0722 10/01/20 0533  WBC 5.6 3.2*  NEUTROABS 3.3  --   HGB 13.3 12.8  HCT 40.9 39.5  MCV 101.7* 103.4*  PLT 161 139*    Cardiac Enzymes: No results for input(s): CKTOTAL, CKMB, CKMBINDEX, TROPONINI in the last 168 hours.  BNP: Invalid input(s):  POCBNP  CBG: No results for input(s): GLUCAP in the last 168 hours.  Microbiology: Results for orders placed or performed during the hospital encounter of 09/30/20  SARS CORONAVIRUS 2 (TAT 6-24 HRS) Nasopharyngeal Nasopharyngeal Swab     Status: None   Collection Time: 09/30/20  8:57 AM   Specimen: Nasopharyngeal Swab  Result Value Ref Range Status   SARS Coronavirus 2 NEGATIVE NEGATIVE Final    Comment: (NOTE) SARS-CoV-2 target nucleic acids are NOT DETECTED.  The SARS-CoV-2 RNA is generally detectable in upper and lower respiratory specimens during the acute phase of infection. Negative results do not preclude SARS-CoV-2 infection, do not rule out co-infections with other pathogens, and should not be used as the sole basis for treatment or other patient management decisions. Negative results must be combined with clinical observations, patient history, and epidemiological information. The expected result is Negative.  Fact Sheet for Patients: SugarRoll.be  Fact Sheet for Healthcare Providers: https://www.woods-mathews.com/  This test is not yet approved or cleared by the [place] FDA and  has been authorized for detection and/or diagnosis of SARS-CoV-2 by FDA under an Emergency Use Authorization (EUA). This EUA will remain  in effect (meaning this test can be used) for the duration of the COVID-19 declaration under Se ction 564(b)(1) of the Act, 21 U.S.C. section 360bbb-3(b)(1), unless the authorization is terminated or revoked sooner.  Performed at [facility], [place]  [address], [place],  [address]     Coagulation Studies: No results for input(s): LABPROT, INR in the last 72 hours.  Urinalysis: No results for input(s): COLORURINE, LABSPEC, PHURINE, GLUCOSEU, HGBUR, BILIRUBINUR, KETONESUR, PROTEINUR, UROBILINOGEN, NITRITE, LEUKOCYTESUR in the last 72 hours.  Invalid input(s): APPERANCEUR    Imaging: DG  Chest 2 View  Result Date: 09/30/2020 CLINICAL DATA:  Chest pain EXAM: CHEST - 2 VIEW COMPARISON:  02/06/2020 FINDINGS: Diffuse interstitial opacity with Kerley lines. No effusion or pneumothorax. Chronic marked cardiomegaly. There is aortic and coronary atherosclerosis. Prior CABG. Stent in the left arm which is likely dialysis related. Sclerotic appearance of endplates attributed to renal osteodystrophy. IMPRESSION: 1. Cardiomegaly and pulmonary edema. 2. Renal osteodystrophy. Electronically Signed   By: Monte Fantasia M.D.   On: 09/30/2020 07:19     Medications:       Assessment/ Plan:  [name] is a 60 y.o. black female with end stage renal disease on hemodialysis, hypertension, coronary artery disease status post CABG, congestive heart failure, atrial fibrillation, chronic hepatitis C, chronic pain who is admitted to [facility] on 09/30/2020 for Hyperkalemia [E87.5] Chest pain, unspecified type [R07.9]  [facility] MWF [place] Left AVF 71kg  1. End stage renal disease with hyperkalemia: placed on emergent hemodialysis treatment today. Seen and examined on treatment. Tolerating treatment well.  -Received dialysis yesterday - UF goal 1.9L achieved - Instructed patient to take prescribed [place] and monitor diet   2. Hypotension: blood pressure at goal during treatment.  - Continue midodrine  3. Anemia with chronic kidney disease: hemoglobin at goal, 13.3, macrocytic.  - at goal - Continue outpatient ESA  4. Secondary Hyperparathyroidism: with hypocalcemia.  - hold cinacalcet - sevelamer with meals.    LOS: 0   4/19/202212:47 PM

## 2020-10-01 NOTE — Progress Notes (Signed)
[name] to be D/C'd Home per MD order.  Discussed prescriptions and follow up appointments with the patient. NO Prescriptions given to patient, medication list explained in detail. Pt verbalized understanding.  Allergies as of 10/01/2020      Reactions   Morphine And Related Hives   Levaquin [levofloxacin] Itching   Severe itching; prickly sensation   Other    Latex Rash   Tape Rash      Medication List    TAKE these medications   amiodarone 200 MG tablet Commonly known as: PACERONE Take 200 mg by mouth daily.   carvedilol 6.25 MG tablet Commonly known as: COREG Take 6.25 mg by mouth 2 (two) times daily.   cinacalcet 90 MG tablet Commonly known as: SENSIPAR Take 90 mg by mouth daily.   Eliquis 5 MG Tabs tablet Generic drug: apixaban Per instructions TWICE DAILY  (route: oral)   Entresto 24-26 MG Generic drug: sacubitril-valsartan Per instructions TWICE DAILY  (route: oral)   hydrOXYzine 50 MG tablet Commonly known as: ATARAX/VISTARIL Take 50 mg by mouth 2 (two) times daily as needed for itching.   levothyroxine 75 MCG tablet Commonly known as: SYNTHROID Take 75 mcg by mouth daily before breakfast.   lidocaine-prilocaine cream Commonly known as: EMLA APPLY TO ACCESS SITE 30 MINUTES PRIOR TO DIALYSIS   midodrine 10 MG tablet Commonly known as: PROAMATINE Take 10-20 mg by mouth daily. (for low BP associated with dialysis)   oxyCODONE-acetaminophen 5-325 MG tablet Commonly known as: PERCOCET/ROXICET Take 1 tablet by mouth 2 (two) times daily as needed for moderate pain.   pantoprazole 40 MG tablet Commonly known as: PROTONIX Take 40 mg by mouth daily.   ranolazine 500 MG 12 hr tablet Commonly known as: RANEXA Take 500 mg by mouth 2 (two) times daily.   rosuvastatin 20 MG tablet Commonly known as: CRESTOR Take 20 mg by mouth at bedtime. What changed: Another medication with the same name was removed. Continue taking this medication, and follow the  directions you see here.   sevelamer carbonate 800 MG tablet Commonly known as: RENVELA Take 2,400 mg by mouth 3 (three) times daily with meals. And 1 tablet with snacks   Zofran 4 MG tablet Generic drug: ondansetron Take 4 mg by mouth every 4 (four) hours as needed for nausea/vomiting.       Vitals:   10/01/20 1145 10/01/20 1200  BP: 120/77   Pulse: 70   Resp: 18   Temp:    SpO2: (!) 87% 96%    Skin clean, dry and intact without evidence of skin break down, no evidence of skin tears noted. IV catheter discontinued intact. Site without signs and symptoms of complications. Dressing and pressure applied. Pt denies pain at this time. No complaints noted.  An After Visit Summary was printed and given to the patient. Patient waiting on ride to be discharged home  [place]

## 2020-10-19 ENCOUNTER — Other Ambulatory Visit: Payer: Self-pay

## 2020-10-19 ENCOUNTER — Emergency Department
Admission: EM | Admit: 2020-10-19 | Discharge: 2020-10-19 | Disposition: A | Discharge status: Home / Self Care | Payer: Medicare Other | Attending: Emergency Medicine | Admitting: Emergency Medicine

## 2020-10-19 ENCOUNTER — Emergency Department: Payer: Medicare Other

## 2020-10-19 DIAGNOSIS — F17210 Nicotine dependence, cigarettes, uncomplicated: Secondary | ICD-10-CM | POA: Diagnosis not present

## 2020-10-19 DIAGNOSIS — Z91040 Latex allergy status: Secondary | ICD-10-CM | POA: Insufficient documentation

## 2020-10-19 DIAGNOSIS — Z955 Presence of coronary angioplasty implant and graft: Secondary | ICD-10-CM | POA: Insufficient documentation

## 2020-10-19 DIAGNOSIS — D631 Anemia in chronic kidney disease: Secondary | ICD-10-CM | POA: Insufficient documentation

## 2020-10-19 DIAGNOSIS — M25561 Pain in right knee: Secondary | ICD-10-CM | POA: Insufficient documentation

## 2020-10-19 DIAGNOSIS — Y92009 Unspecified place in unspecified non-institutional (private) residence as the place of occurrence of the external cause: Secondary | ICD-10-CM | POA: Diagnosis not present

## 2020-10-19 DIAGNOSIS — I509 Heart failure, unspecified: Secondary | ICD-10-CM | POA: Insufficient documentation

## 2020-10-19 DIAGNOSIS — F0390 Unspecified dementia without behavioral disturbance: Secondary | ICD-10-CM | POA: Insufficient documentation

## 2020-10-19 DIAGNOSIS — N186 End stage renal disease: Secondary | ICD-10-CM | POA: Diagnosis not present

## 2020-10-19 DIAGNOSIS — I132 Hypertensive heart and chronic kidney disease with heart failure and with stage 5 chronic kidney disease, or end stage renal disease: Secondary | ICD-10-CM | POA: Diagnosis not present

## 2020-10-19 DIAGNOSIS — Z992 Dependence on renal dialysis: Secondary | ICD-10-CM | POA: Diagnosis not present

## 2020-10-19 DIAGNOSIS — I2510 Atherosclerotic heart disease of native coronary artery without angina pectoris: Secondary | ICD-10-CM | POA: Insufficient documentation

## 2020-10-19 DIAGNOSIS — W0110XA Fall on same level from slipping, tripping and stumbling with subsequent striking against unspecified object, initial encounter: Secondary | ICD-10-CM | POA: Diagnosis not present

## 2020-10-19 DIAGNOSIS — I4891 Unspecified atrial fibrillation: Secondary | ICD-10-CM | POA: Diagnosis not present

## 2020-10-19 DIAGNOSIS — Z79899 Other long term (current) drug therapy: Secondary | ICD-10-CM | POA: Diagnosis not present

## 2020-10-19 DIAGNOSIS — E039 Hypothyroidism, unspecified: Secondary | ICD-10-CM | POA: Diagnosis not present

## 2020-10-19 DIAGNOSIS — Z7901 Long term (current) use of anticoagulants: Secondary | ICD-10-CM | POA: Insufficient documentation

## 2020-10-19 DIAGNOSIS — S40011A Contusion of right shoulder, initial encounter: Secondary | ICD-10-CM | POA: Diagnosis not present

## 2020-10-19 DIAGNOSIS — W19XXXA Unspecified fall, initial encounter: Secondary | ICD-10-CM

## 2020-10-19 DIAGNOSIS — S4991XA Unspecified injury of right shoulder and upper arm, initial encounter: Secondary | ICD-10-CM | POA: Diagnosis present

## 2020-10-19 MED ORDER — LIDOCAINE 5 % EX PTCH: 1.0000 | MEDICATED_PATCH | Freq: Two times a day (BID) | CUTANEOUS | 0 refills | Status: DC

## 2020-10-19 NOTE — ED Triage Notes (Signed)
Pt BIBA for eval of witnessed fall at home, pt sts tripped and fell on her shoes, fell forward and denies LOC, takes eliquisl pt also c/o R knee/shoulder pain

## 2020-10-19 NOTE — ED Provider Notes (Signed)
[facility] Emergency Department Provider Note   ____________________________________________   Event Date/Time   First MD Initiated Contact with Patient 10/19/20 1910     (approximate)  I have reviewed the triage vital signs and the nursing notes.   HISTORY  Chief Complaint Fall    HPI [name] is a 60 y.o. female with past medical history of hypertension, hyperlipidemia, paroxysmal atrial fibrillation on Eliquis, CAD, CHF, stroke, and ESRD on HD (MWF) who presents to the ED for fall.  Patient reports that just prior to arrival she tripped on her shoes and fell forward, striking her head and face.  She denies losing consciousness but thought she should get checked out because she takes Eliquis.  She complains of pain in her right shoulder and right knee since the fall.  Both areas of pain are described as sharp and worse with movement.  She denies any pain in her neck, chest, abdomen, or hips.  She underwent a full run of dialysis yesterday and has not missed any treatments recently.        Past Medical History:  Diagnosis Date  . Anemia    chronic disease  . CHF (congestive heart failure) ([place])   . Coronary artery disease   . Heart failure ([place])   . Hepatitis    history of hep c  . Hyperlipidemia   . Hypertension   . Myocardial infarction ([place])   . Paroxysmal atrial fibrillation (HCC)   . Renal failure   . Renal insufficiency   . Stroke [facility]) 2011    Patient Active Problem List   Diagnosis Date Noted  . Paroxysmal atrial fibrillation (HCC)   . Hypothyroidism   . Subdural hematoma ([place]) 07/25/2020  . Syncope and collapse 07/25/2020  . ESRD (end stage renal disease) ([place]) 01/30/2019  . Unstable angina ([place]) 01/23/2019  . Non-STEMI (non-ST elevated myocardial infarction) ([place]) 08/07/2018  . Pneumonia 01/31/2018  . Swelling of limb 09/22/2016  . Kidney dialysis as the cause of abnormal reaction of the patient, or of later  complication, without mention of misadventure at the time of the procedure (CODE) 03/24/2016  . Hyperkalemia 11/13/2014  . Generalized weakness 10/15/2014  . Chest pain 10/15/2014  . ESRD on hemodialysis ([place]) 10/15/2014  . Congestive heart failure (CHF) ([place]) 10/15/2014  . HTN (hypertension) 10/15/2014  . Sternal wound dehiscence 04/12/2014  . 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 Pericardial effusion 04/12/2014  . History of delirium 04/12/2014  . H/O atrial flutter 04/12/2014  . Delayed surgical wound healing 04/12/2014  . Chest wall pain following surgery 04/12/2014  . S/P CABG (coronary artery bypass graft) 04/02/2014  . Lactic acidosis 03/26/2014  . Arteriosclerotic dementia ([place]) 03/25/2014  . Atrial flutter by electrocardiogram ([place]) 03/22/2014  . Superficial incisional surgical site infection 03/14/2014  . Postoperative anemia due to acute blood loss 02/27/2014  . Obesity 02/27/2014  . H/O: substance abuse ([place]) 02/27/2014  . Anemia of chronic renal failure 02/27/2014  . Acute postoperative pain 02/27/2014  . Leukocytosis 02/26/2014  . Hypotension 02/24/2014  . Exhausted vascular access 02/24/2014  . Anemia of chronic disease 02/24/2014  . Stroke ([place]) 02/21/2014  . S/P CABG x 3 02/21/2014  . Dialysis patient ([place]) 02/21/2014  . Hyperlipidemia 02/13/2014  . History of hepatitis C 02/13/2014  . HFrEF (heart failure with reduced ejection fraction) ([place]) 02/13/2014  . H/O stroke without residual deficits 02/13/2014  . ESRD (end stage renal disease) on dialysis ([place]) 02/13/2014  . End  stage renal disease with hypertension ([place]) 02/13/2014  . CAD, multiple vessel 02/13/2014  . Retrosternal chest pain 05/30/2013  . Gallstones 05/29/2013    Past Surgical History:  Procedure Laterality Date  . A/V FISTULAGRAM Left 03/30/2019   Procedure: A/V FISTULAGRAM;  Surgeon: Algernon Huxley, MD;  Location: [place] [facility];  Service: Cardiovascular;  Laterality: Left;  . A/V  FISTULAGRAM Left 10/06/2019   Procedure: A/V FISTULAGRAM;  Surgeon: Algernon Huxley, MD;  Location: [place] [facility];  Service: Cardiovascular;  Laterality: Left;  . CORONARY ANGIOPLASTY WITH STENT PLACEMENT  2013  . CORONARY ARTERY BYPASS GRAFT    . DIALYSIS FISTULA CREATION    . FLEXIBLE SIGMOIDOSCOPY N/A 02/23/2019   Procedure: FLEXIBLE SIGMOIDOSCOPY;  Surgeon: Lollie Sails, MD;  Location: [facility] ENDOSCOPY;  Service: Endoscopy;  Laterality: N/A;  . LEFT HEART CATH AND CORS/GRAFTS ANGIOGRAPHY N/A 08/08/2018   Procedure: LEFT HEART CATH AND CORS/GRAFTS ANGIOGRAPHY;  Surgeon: Dionisio David, MD;  Location: [place] [facility];  Service: Cardiovascular;  Laterality: N/A;  . LEFT HEART CATH AND CORS/GRAFTS ANGIOGRAPHY N/A 01/30/2019   Procedure: LEFT HEART CATH AND CORS/GRAFTS ANGIOGRAPHY;  Surgeon: Dionisio David, MD;  Location: [place] [facility];  Service: Cardiovascular;  Laterality: N/A;    Prior to Admission medications   Medication Sig Start Date End Date Taking? Authorizing Provider  lidocaine (LIDODERM) 5 % Place 1 patch onto the skin every 12 (twelve) hours. Remove & Discard patch within 12 hours or as directed by MD 10/19/20 10/19/21 Yes Blake Divine, MD  amiodarone (PACERONE) 200 MG tablet Take 200 mg by mouth daily. 07/30/20   [provider]  apixaban (ELIQUIS) 5 MG TABS tablet Per instructions TWICE DAILY  (route: oral) 09/11/19   [provider]  carvedilol (COREG) 6.25 MG tablet Take 6.25 mg by mouth 2 (two) times daily. 08/13/20   [provider]  cinacalcet (SENSIPAR) 90 MG tablet Take 90 mg by mouth daily.    [provider]  hydrOXYzine (ATARAX/VISTARIL) 50 MG tablet Take 50 mg by mouth 2 (two) times daily as needed for itching.     [provider]  levothyroxine (SYNTHROID) 75 MCG tablet Take 75 mcg by mouth daily before breakfast.  11/02/18   [provider]  lidocaine-prilocaine (EMLA) cream APPLY TO ACCESS SITE 30  MINUTES PRIOR TO DIALYSIS 04/08/19   [provider]  midodrine (PROAMATINE) 10 MG tablet Take 10-20 mg by mouth daily. (for low BP associated with dialysis)    [provider]  oxyCODONE-acetaminophen (PERCOCET/ROXICET) 5-325 MG tablet Take 1 tablet by mouth 2 (two) times daily as needed for moderate pain.  12/08/18   [provider]  pantoprazole (PROTONIX) 40 MG tablet Take 40 mg by mouth daily.    [provider]  ranolazine (RANEXA) 500 MG 12 hr tablet Take 500 mg by mouth 2 (two) times daily. 02/14/20   [provider]  rosuvastatin (CRESTOR) 20 MG tablet Take 20 mg by mouth at bedtime. 09/02/20   [provider]  sacubitril-valsartan (ENTRESTO) 24-26 MG Per instructions TWICE DAILY  (route: oral) 11/05/19   [provider]  sevelamer carbonate (RENVELA) 800 MG tablet Take 2,400 mg by mouth 3 (three) times daily with meals. And 1 tablet with snacks    [provider]  ZOFRAN 4 MG tablet Take 4 mg by mouth every 4 (four) hours as needed for nausea/vomiting. 05/20/20   [provider]    Allergies Morphine and  related, Levaquin [levofloxacin], Other, Latex, and Tape  Family History  Problem Relation Age of Onset  . Hypertension Other   . Cancer Other   . Renal Disease Other   . Ovarian cancer Mother   . Kidney disease Father   . Breast cancer Maternal Aunt   . Breast cancer Maternal Grandmother     Social History Social History   Tobacco Use  . Smoking status: Current Some Day Smoker    Packs/day: 0.10    Years: 20.00    Pack years: 2.00    Types: Cigarettes  . Smokeless tobacco: Never Used  . Tobacco comment: smokes one a day  Vaping Use  . Vaping Use: Never used  Substance Use Topics  . Alcohol use: No  . Drug use: No    Review of Systems  Constitutional: No fever/chills Eyes: No visual changes. ENT: No sore throat. Cardiovascular: Denies chest pain. Respiratory: Denies shortness of  breath. Gastrointestinal: No abdominal pain.  No nausea, no vomiting.  No diarrhea.  No constipation. Genitourinary: Negative for dysuria. Musculoskeletal: Negative for back pain.  Positive for right shoulder and right knee pain. Skin: Negative for rash. Neurological: Negative for headaches, focal weakness or numbness.  ____________________________________________   PHYSICAL EXAM:  VITAL SIGNS: ED Triage Vitals  Enc Vitals Group     BP --      Pulse Rate 10/19/20 1913 76     Resp --      Temp --      Temp src --      SpO2 10/19/20 1910 98 %     Weight 10/19/20 1914 160 lb (72.6 kg)     Height 10/19/20 1914 5\' 6"  (1.676 m)     Head Circumference --      Peak Flow --      Pain Score 10/19/20 1914 8     Pain Loc --      Pain Edu? --      Excl. in [place]? --     Constitutional: Alert and oriented. Eyes: Conjunctivae are normal. Head: Atraumatic. Nose: No congestion/rhinnorhea. Mouth/Throat: Mucous membranes are moist. Neck: Normal ROM, no midline cervical spine tenderness to palpation. Cardiovascular: Normal rate, regular rhythm. Grossly normal heart sounds.  Left upper extremity AV fistula with palpable thrill. Respiratory: Normal respiratory effort.  No retractions. Lungs CTAB. Gastrointestinal: Soft and nontender. No distention. Genitourinary: deferred Musculoskeletal: No lower extremity tenderness nor edema. Neurologic:  Normal speech and language. No gross focal neurologic deficits are appreciated. Skin:  Skin is warm, dry and intact. No rash noted. Psychiatric: Mood and affect are normal. Speech and behavior are normal.  ____________________________________________   LABS (all labs ordered are listed, but only abnormal results are displayed)  Labs Reviewed - No data to display ____________________________________________  EKG  ED ECG REPORT I, Blake Divine, the attending physician, personally viewed and interpreted this ECG.   Date: 10/20/2020  EKG Time:  19:16  Rate: 78  Rhythm: normal sinus rhythm  Axis: Normal  Intervals:right bundle branch block  ST&T Change: None   PROCEDURES  Procedure(s) performed (including Critical Care):  Procedures   ____________________________________________   INITIAL IMPRESSION / ASSESSMENT AND PLAN / ED COURSE       59 year old female presents to the ED following mechanical fall where she tripped on her shoes and fell forward striking her head and face.  She takes Eliquis for atrial fibrillation but denies losing consciousness and has no focal neurologic deficits on exam.  CT head and  cervical spine are negative for acute process.  X-rays of right shoulder and right knee reviewed by me with no fracture or dislocation.  No evidence of other traumatic injury and patient is appropriate for discharge home with PCP follow-up.      ____________________________________________   FINAL CLINICAL IMPRESSION(S) / ED DIAGNOSES  Final diagnoses:  Fall, initial encounter  Contusion of right shoulder, initial encounter     ED Discharge Orders         Ordered    lidocaine (LIDODERM) 5 %  Every 12 hours        10/19/20 2054           Note:  This document was prepared using Dragon voice recognition software and may include unintentional dictation errors.   Blake Divine, MD 10/20/20 [id number]

## 2020-10-19 NOTE — ED Notes (Signed)
X-ray at bedside

## 2020-10-19 NOTE — ED Notes (Signed)
Patient back to room from CT

## 2020-11-12 IMAGING — MG DIGITAL DIAGNOSTIC BILATERAL MAMMOGRAM WITH TOMO AND CAD
8 series · 8 of 24 positions shown · non-contrast
Comparison: Previous exam(s).

CLINICAL DATA: 58-year-old female with diffuse left breast pain,
occasionally radiating to the left axilla, for approximately 2-3
weeks.

EXAM:
DIGITAL DIAGNOSTIC BILATERAL MAMMOGRAM WITH CAD AND TOMO

[L CC synth-2D]
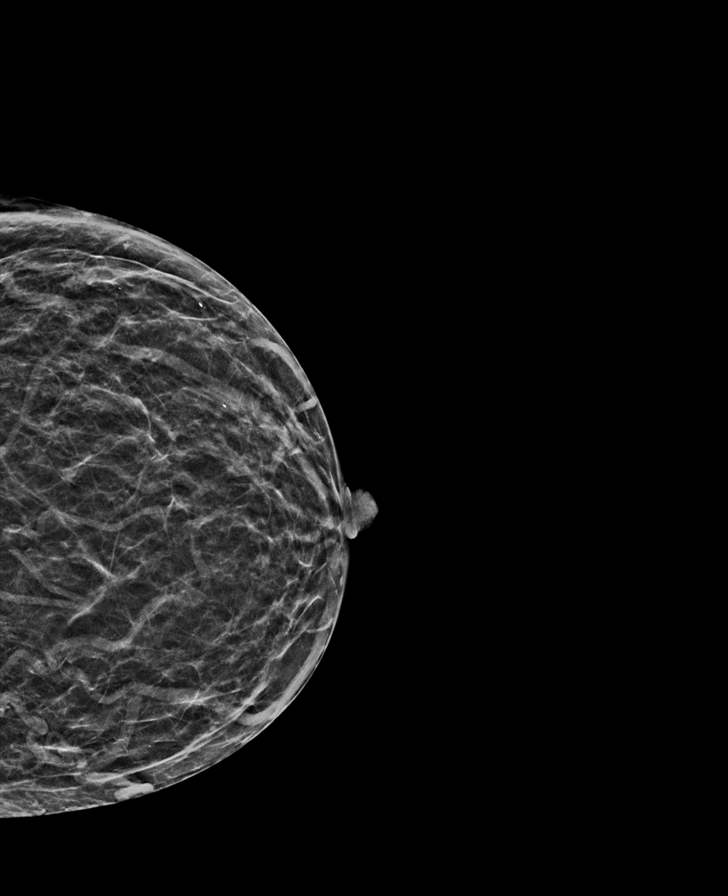

[R MLO synth-2D]
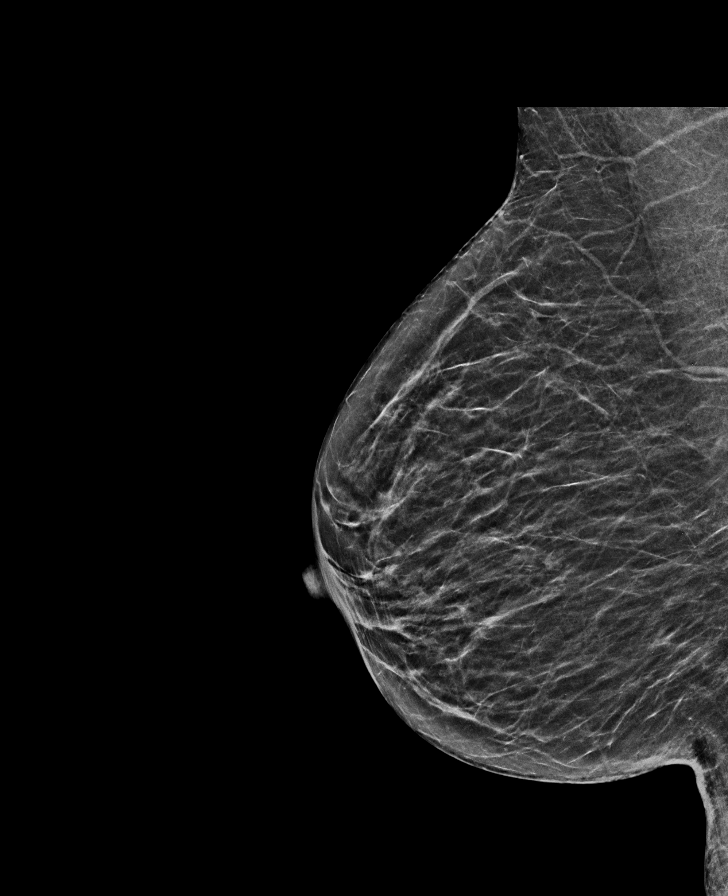

[L MLO synth-2D]
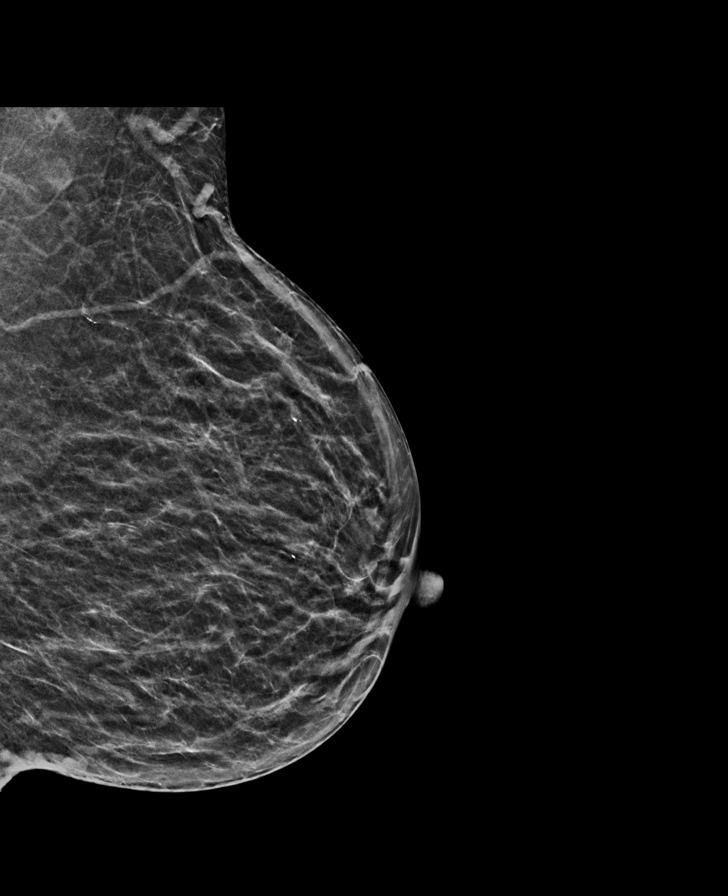

[R CC synth-2D]
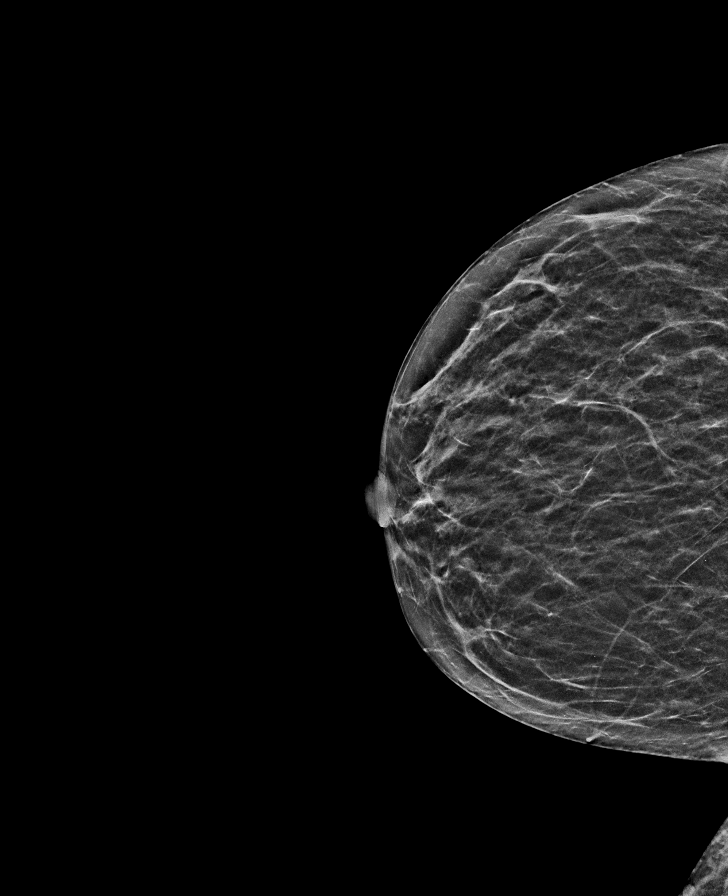

[L MLO tomo · tomo slice 25/48.0]
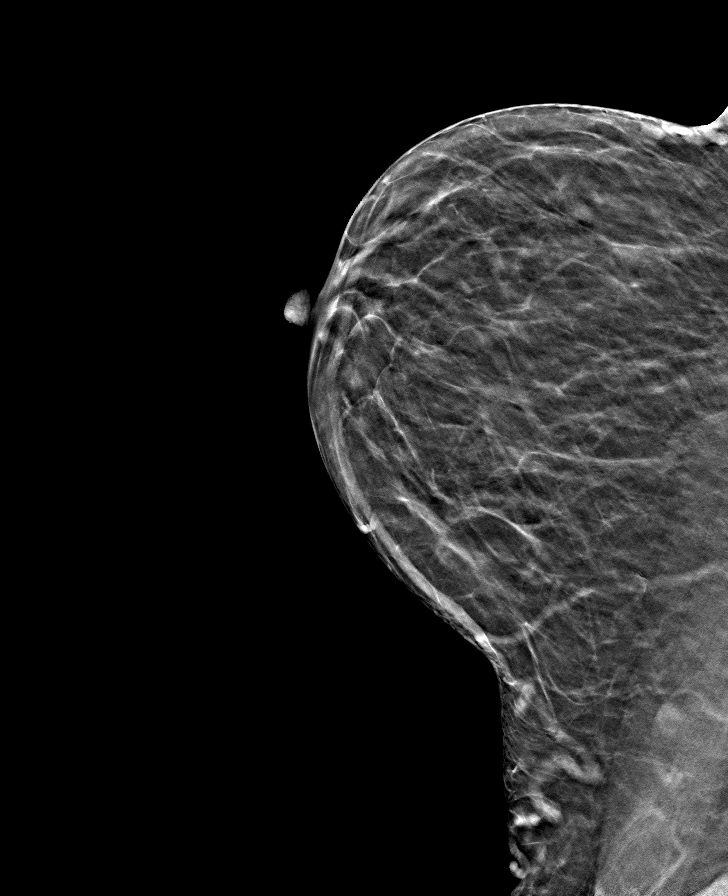

[L CC tomo · tomo slice 21/41.0]
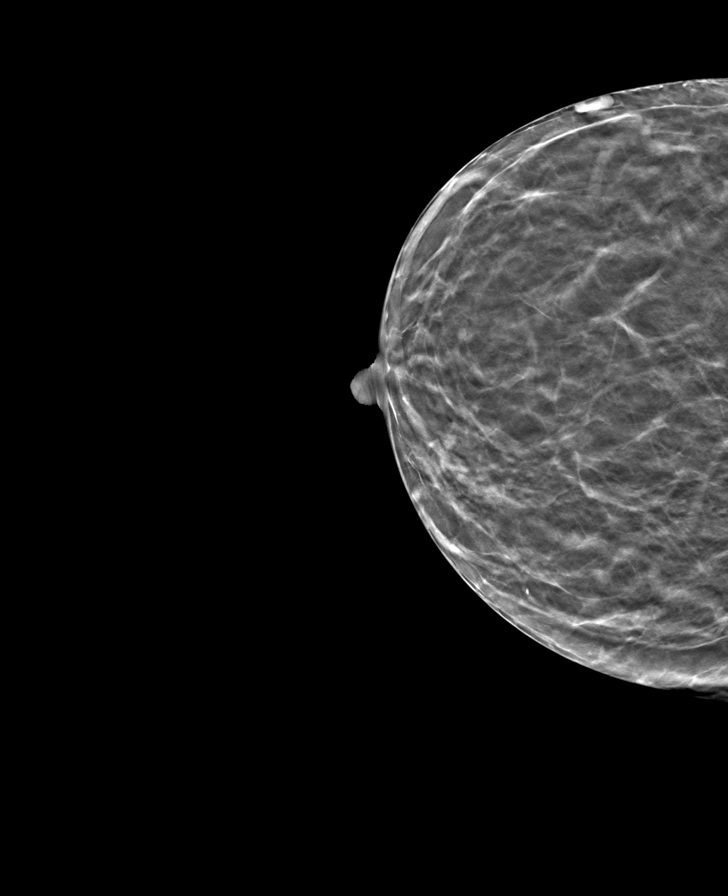

[R MLO tomo · tomo slice 24/47.0]
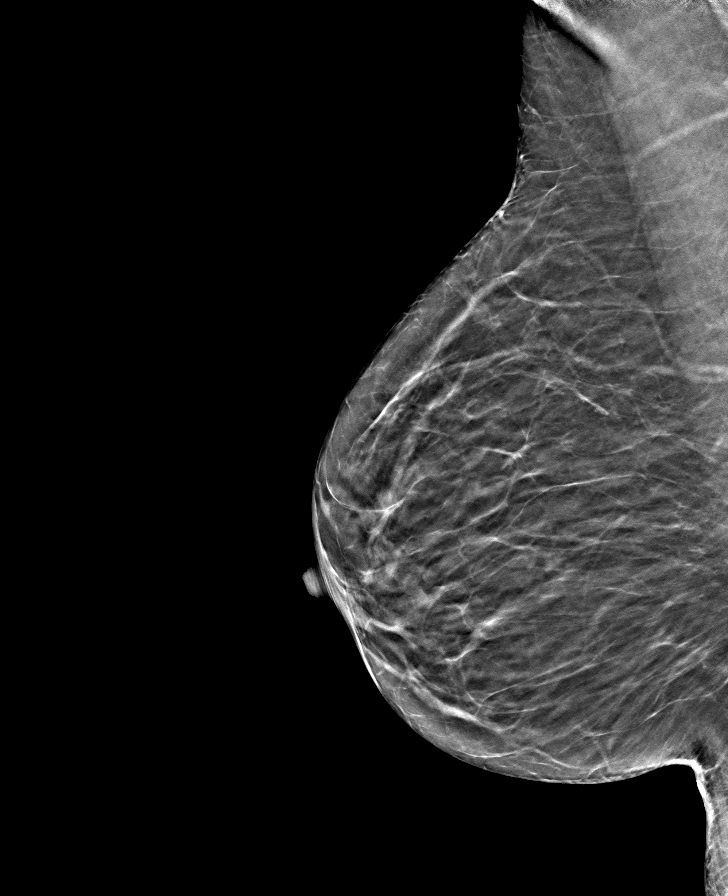

[R CC tomo · tomo slice 22/43.0]
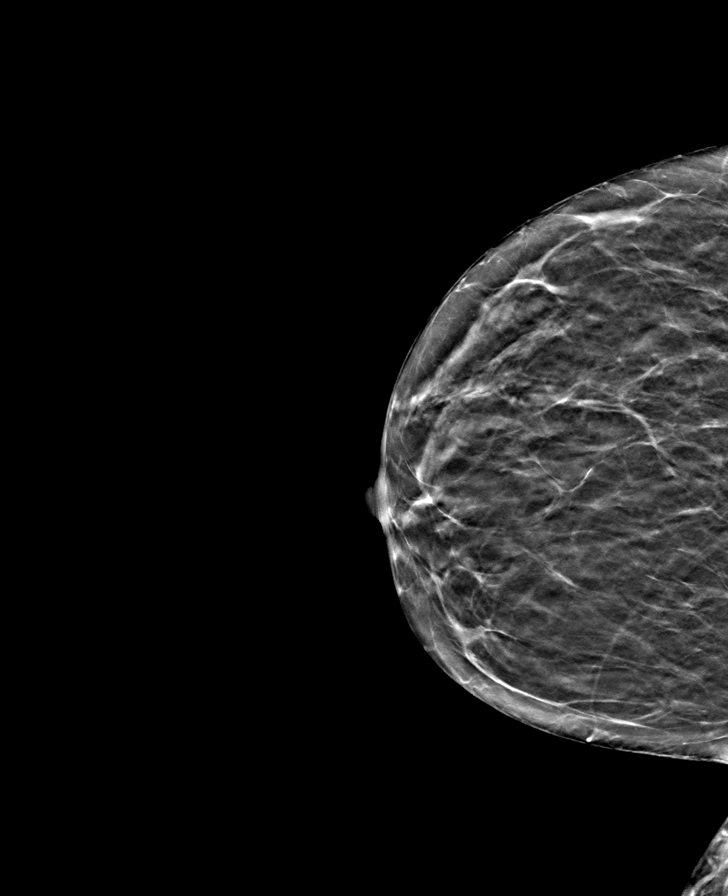

[8 of 24 positions shown; findings below may reference images not displayed]

ACR Breast Density Category b: There are scattered areas of
fibroglandular density.
FINDINGS: No suspicious mammographic findings are identified. The parenchymal
pattern is stable.

Mammographic images were processed with CAD.
IMPRESSION: No mammographic evidence of malignancy.

RECOMMENDATION:
1. Clinical follow-up recommended for the painful area of concern in
the left breast. Any further workup should be based on clinical
grounds.
2.  Screening mammogram in one year.(Code:[id number]-[id number])

I have discussed the findings and recommendations with the patient.
Results were also provided in writing at the conclusion of the
visit. If applicable, a reminder letter will be sent to the patient
regarding the next appointment.

BI-RADS CATEGORY  1: Negative.

## 2020-11-13 DIAGNOSIS — Z992 Dependence on renal dialysis: Secondary | ICD-10-CM | POA: Diagnosis not present

## 2020-11-13 DIAGNOSIS — N186 End stage renal disease: Secondary | ICD-10-CM | POA: Diagnosis not present

## 2020-11-15 DIAGNOSIS — N186 End stage renal disease: Secondary | ICD-10-CM | POA: Diagnosis not present

## 2020-11-15 DIAGNOSIS — Z992 Dependence on renal dialysis: Secondary | ICD-10-CM | POA: Diagnosis not present

## 2020-11-18 DIAGNOSIS — E875 Hyperkalemia: Secondary | ICD-10-CM | POA: Diagnosis not present

## 2020-11-18 DIAGNOSIS — Z992 Dependence on renal dialysis: Secondary | ICD-10-CM | POA: Diagnosis not present

## 2020-11-18 DIAGNOSIS — N186 End stage renal disease: Secondary | ICD-10-CM | POA: Diagnosis not present

## 2020-11-20 DIAGNOSIS — Z992 Dependence on renal dialysis: Secondary | ICD-10-CM | POA: Diagnosis not present

## 2020-11-20 DIAGNOSIS — N186 End stage renal disease: Secondary | ICD-10-CM | POA: Diagnosis not present

## 2020-11-22 DIAGNOSIS — Z992 Dependence on renal dialysis: Secondary | ICD-10-CM | POA: Diagnosis not present

## 2020-11-22 DIAGNOSIS — N186 End stage renal disease: Secondary | ICD-10-CM | POA: Diagnosis not present

## 2020-11-23 DIAGNOSIS — I509 Heart failure, unspecified: Secondary | ICD-10-CM | POA: Diagnosis not present

## 2020-11-25 DIAGNOSIS — N186 End stage renal disease: Secondary | ICD-10-CM | POA: Diagnosis not present

## 2020-11-25 DIAGNOSIS — Z992 Dependence on renal dialysis: Secondary | ICD-10-CM | POA: Diagnosis not present

## 2020-11-25 DIAGNOSIS — N2581 Secondary hyperparathyroidism of renal origin: Secondary | ICD-10-CM | POA: Diagnosis not present

## 2020-11-25 DIAGNOSIS — E875 Hyperkalemia: Secondary | ICD-10-CM | POA: Diagnosis not present

## 2020-11-27 DIAGNOSIS — N186 End stage renal disease: Secondary | ICD-10-CM | POA: Diagnosis not present

## 2020-11-27 DIAGNOSIS — Z992 Dependence on renal dialysis: Secondary | ICD-10-CM | POA: Diagnosis not present

## 2020-11-29 DIAGNOSIS — N186 End stage renal disease: Secondary | ICD-10-CM | POA: Diagnosis not present

## 2020-11-29 DIAGNOSIS — Z992 Dependence on renal dialysis: Secondary | ICD-10-CM | POA: Diagnosis not present

## 2020-12-02 DIAGNOSIS — Z992 Dependence on renal dialysis: Secondary | ICD-10-CM | POA: Diagnosis not present

## 2020-12-02 DIAGNOSIS — N186 End stage renal disease: Secondary | ICD-10-CM | POA: Diagnosis not present

## 2020-12-02 DIAGNOSIS — E875 Hyperkalemia: Secondary | ICD-10-CM | POA: Diagnosis not present

## 2020-12-03 IMAGING — CT CT VIRTUAL COLONOSCOPY DIAGNOSTIC
3 of 13 series · 10 of 46 positions shown, 16 images · non-contrast
Comparison: None.

CLINICAL DATA: Bright red blood in stool x2 months

EXAM:
CT VIRTUAL COLONOSCOPY DIAGNOSTIC
TECHNIQUE: The patient was given a standard Mag citrate bowel preparation with
Gastrografin and barium for fluid and stool tagging respectively.
The quality of the bowel preparation is moderate. Automated CO2
insufflation of the colon was performed prior to image acquisition
and colonic distention is moderate. Image post processing was used
to generate a 3D endoluminal fly-through projection of the colon and
to electronically subtract stool/fluid as appropriate.

[Series 13: prone colon 1.50 br40 s3 prone thin · axial · 0.69mm/px · z∈[+1140,+1491]mm · 5 of 352 slices shown, 10 images]
[im 59/352  soft-tissue]
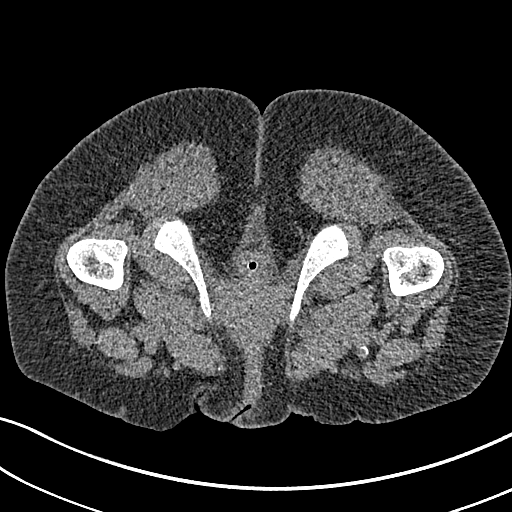
[im 59/352  bone]
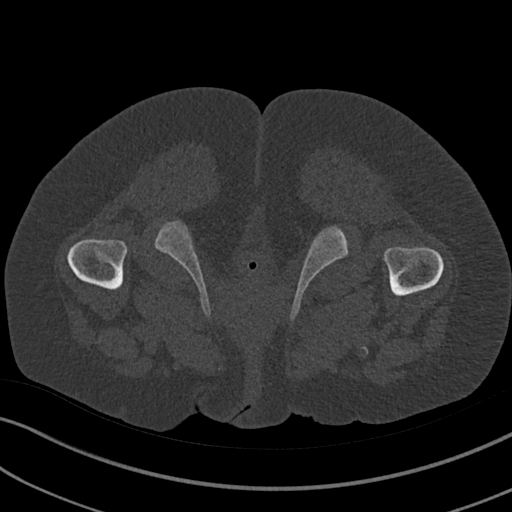
[im 118/352  soft-tissue]
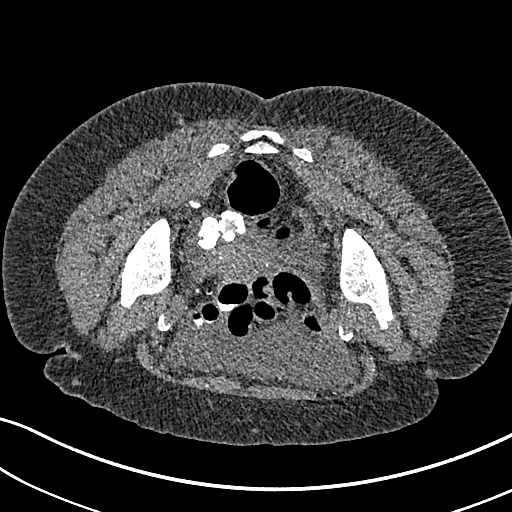
[im 118/352  lung]
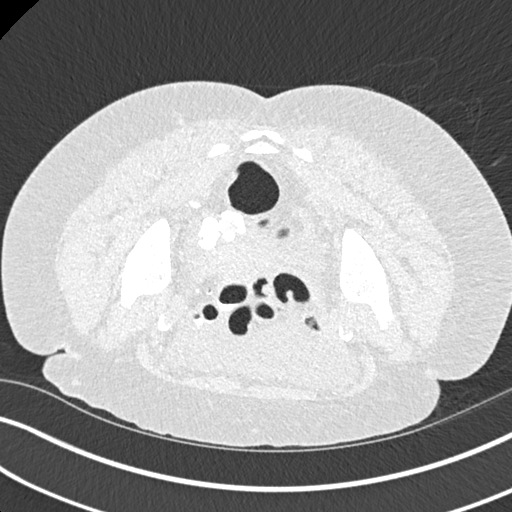
[im 176/352  soft-tissue]
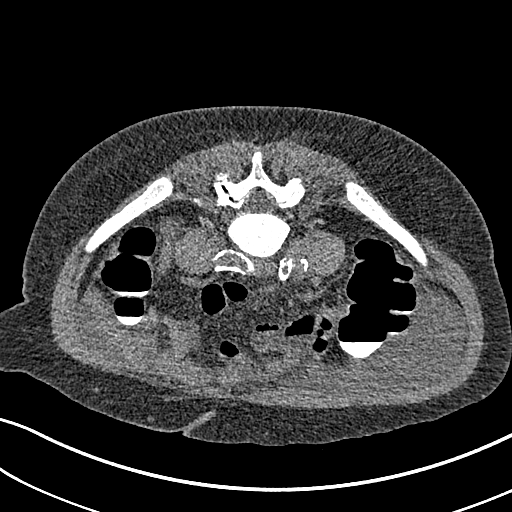
[im 176/352  lung]
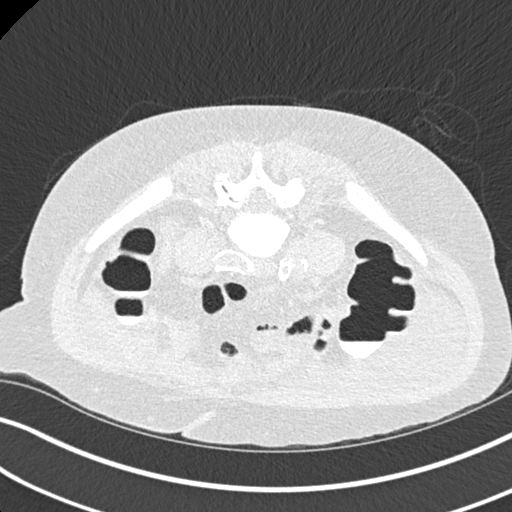
[im 235/352  soft-tissue]
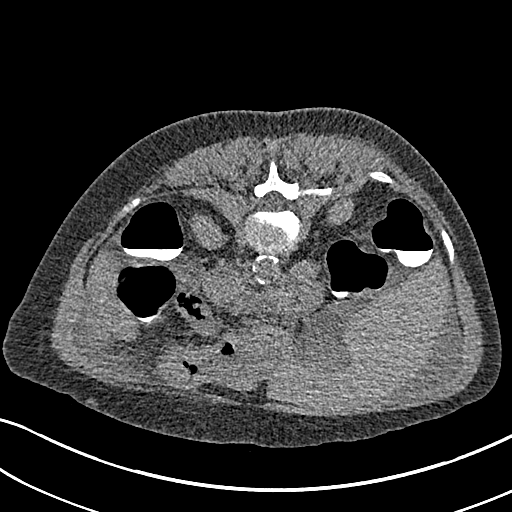
[im 235/352  lung]
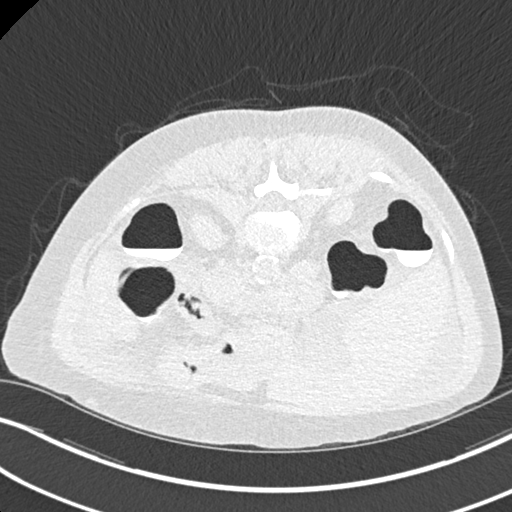
[im 293/352  soft-tissue]
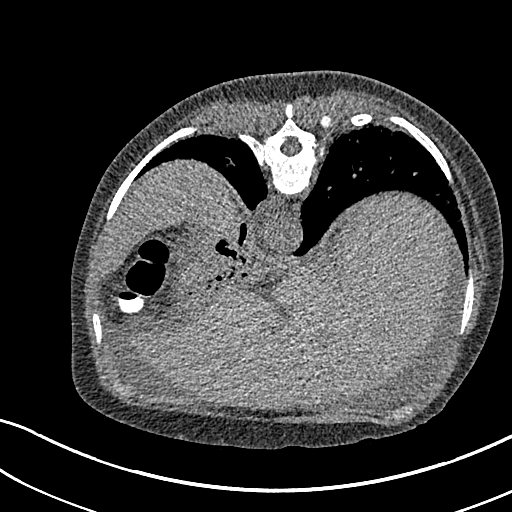
[im 293/352  lung]
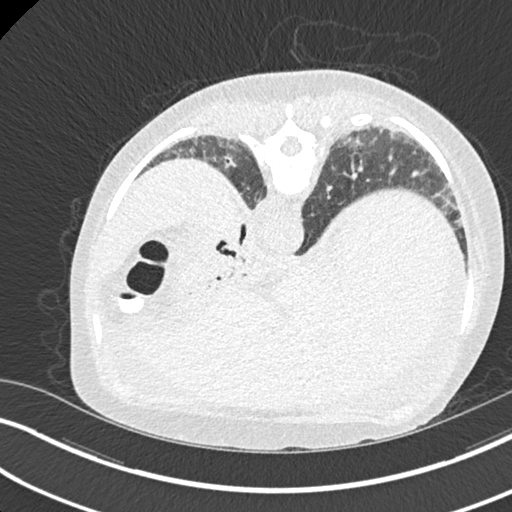

[Series 19: rt decub colon 1.50 br40 s3 rt decub thin · axial · 0.81mm/px · z∈[+1186,+1357]mm · 3 of 341 slices shown]
[im 57/341  soft-tissue]
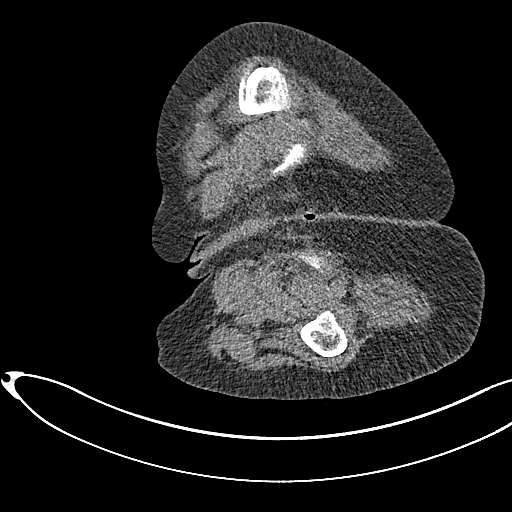
[im 114/341  soft-tissue]
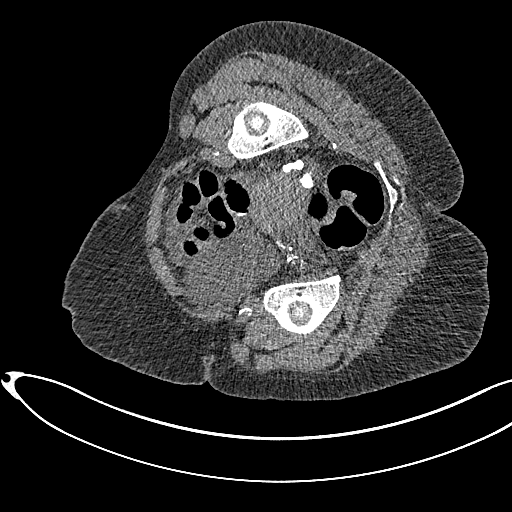
[im 171/341  soft-tissue]
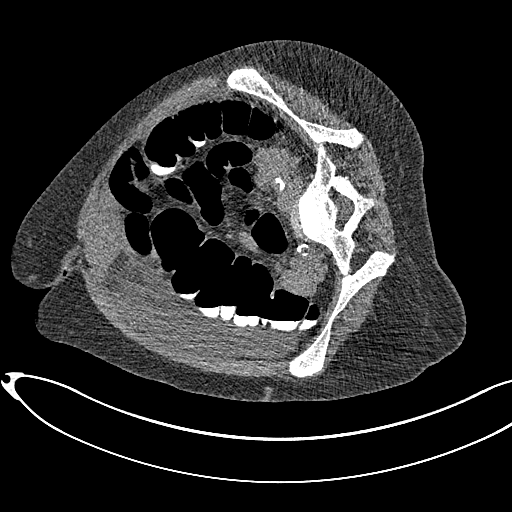

[Series 21: rt decub colon 3.00 br40 s3 cor rt decub · coronal · 0.65mm/px · 2 of 125 slices shown, 3 images]
[im 42/125  soft-tissue]
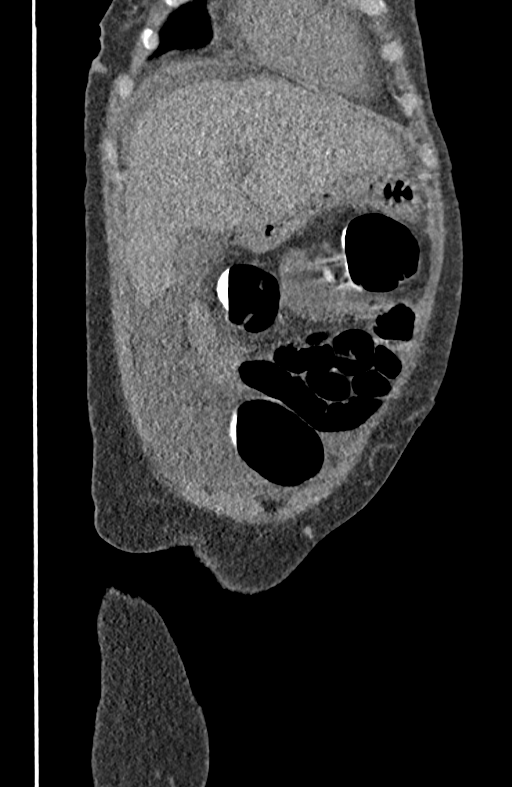
[im 42/125  bone]
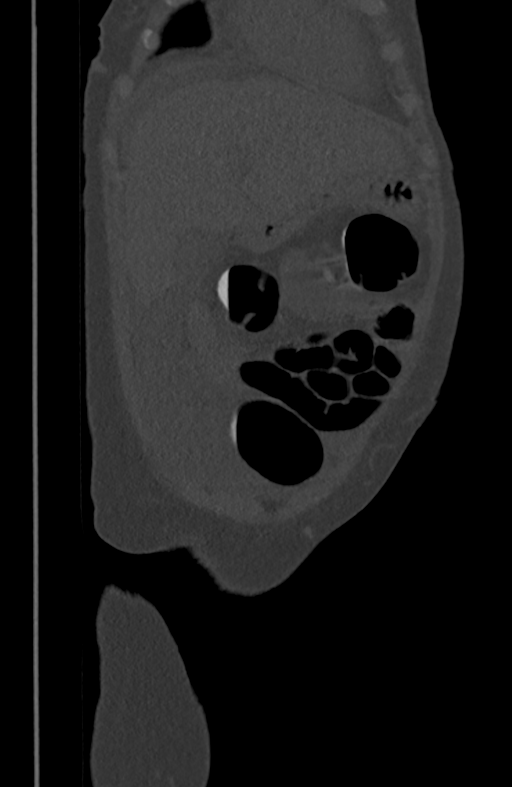
[im 83/125  soft-tissue]
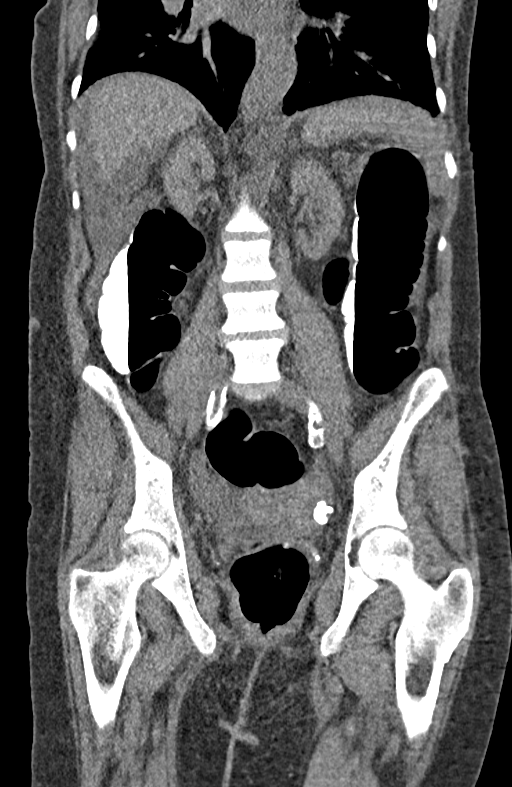

[10 of 46 positions shown; findings below may reference images not displayed]

FINDINGS: VIRTUAL COLONOSCOPY

Moderate layering fluid/contrast within the colon, which shifts
position on supine, prone, and right lateral decubitus imaging.

Sigmoid colon is underdistended on supine imaging, but better
visualized on prone and right lateral decubitus imaging.

14 mm flat polypoid lesion in the right lateral aspect of the
upper/mid rectum, approximately 12 cm from the anal verge. This
lesion is only visualized on the right lateral decubitus images
(series 20/image 117) and as such layering debris is possible, but a
carpet lesion could have this appearance.

Otherwise, no significant colonic polyp, mass, apple core lesion, or
stricture.

No evidence of bowel obstruction. Normal appendix (series 7/image
89).

Virtual colonoscopy is not designed to detect diminutive polyps
(i.e., less than or equal to 5 mm), the presence or absence of which
may not affect clinical management.

CT ABDOMEN AND PELVIS WITHOUT CONTRAST

Lower chest: Mild subpleural reticulation/fibrosis at the lung
bases.

Hepatobiliary: Cirrhosis. No focal hepatic lesion is evident
unenhanced CT.

Cholelithiasis with layering gallbladder sludge. No associated
inflammatory changes. No intrahepatic or extrahepatic ductal
dilatation.

Pancreas: Within normal limits.

Spleen: Spleen is normal in size.

Adrenals/Urinary Tract: Adrenal glands are within normal limits.

Bilateral renal atrophy. 18 mm interpolar left renal cyst (series
7/image 44). No renal calculi or hydronephrosis.

Bladder is within normal limits.

Stomach/Bowel: Stomach is within normal limits.

Visualized bowel is described above.

Vascular/Lymphatic: No evidence of abdominal aortic aneurysm.

Atherosclerotic calcifications the abdominal aorta and branch
vessels.

No suspicious abdominopelvic lymphadenopathy.

Reproductive: Calcified uterine fibroids (series 7/image 93).

Bilateral ovaries are unremarkable.

Other: Moderate abdominopelvic ascites.

Musculoskeletal: Mild osseous sclerosis, nonspecific. Correlate for
renal osteodystrophy.
IMPRESSION: Possible 14 mm flat polypoid lesion along the right lateral aspect
of the upper/mid rectum, proximally 12 cm from the anal verge,
equivocal. Flexible sigmoidoscopy/colonoscopy is suggested for
direct visualization.

Cirrhosis.  Cholelithiasis.  Moderate abdominopelvic ascites.

Additional ancillary findings as above.

## 2020-12-04 DIAGNOSIS — N186 End stage renal disease: Secondary | ICD-10-CM | POA: Diagnosis not present

## 2020-12-04 DIAGNOSIS — Z992 Dependence on renal dialysis: Secondary | ICD-10-CM | POA: Diagnosis not present

## 2020-12-06 DIAGNOSIS — N186 End stage renal disease: Secondary | ICD-10-CM | POA: Diagnosis not present

## 2020-12-06 DIAGNOSIS — Z992 Dependence on renal dialysis: Secondary | ICD-10-CM | POA: Diagnosis not present

## 2020-12-09 DIAGNOSIS — N186 End stage renal disease: Secondary | ICD-10-CM | POA: Diagnosis not present

## 2020-12-09 DIAGNOSIS — D509 Iron deficiency anemia, unspecified: Secondary | ICD-10-CM | POA: Diagnosis not present

## 2020-12-09 DIAGNOSIS — Z992 Dependence on renal dialysis: Secondary | ICD-10-CM | POA: Diagnosis not present

## 2020-12-11 DIAGNOSIS — N186 End stage renal disease: Secondary | ICD-10-CM | POA: Diagnosis not present

## 2020-12-11 DIAGNOSIS — Z992 Dependence on renal dialysis: Secondary | ICD-10-CM | POA: Diagnosis not present

## 2020-12-12 DIAGNOSIS — N186 End stage renal disease: Secondary | ICD-10-CM | POA: Diagnosis not present

## 2020-12-12 DIAGNOSIS — Z992 Dependence on renal dialysis: Secondary | ICD-10-CM | POA: Diagnosis not present

## 2020-12-13 DIAGNOSIS — E875 Hyperkalemia: Secondary | ICD-10-CM | POA: Diagnosis not present

## 2020-12-13 DIAGNOSIS — N186 End stage renal disease: Secondary | ICD-10-CM | POA: Diagnosis not present

## 2020-12-13 DIAGNOSIS — Z992 Dependence on renal dialysis: Secondary | ICD-10-CM | POA: Diagnosis not present

## 2020-12-16 DIAGNOSIS — Z992 Dependence on renal dialysis: Secondary | ICD-10-CM | POA: Diagnosis not present

## 2020-12-16 DIAGNOSIS — N186 End stage renal disease: Secondary | ICD-10-CM | POA: Diagnosis not present

## 2020-12-18 DIAGNOSIS — Z992 Dependence on renal dialysis: Secondary | ICD-10-CM | POA: Diagnosis not present

## 2020-12-18 DIAGNOSIS — N186 End stage renal disease: Secondary | ICD-10-CM | POA: Diagnosis not present

## 2020-12-20 DIAGNOSIS — Z992 Dependence on renal dialysis: Secondary | ICD-10-CM | POA: Diagnosis not present

## 2020-12-20 DIAGNOSIS — N186 End stage renal disease: Secondary | ICD-10-CM | POA: Diagnosis not present

## 2020-12-23 DIAGNOSIS — N186 End stage renal disease: Secondary | ICD-10-CM | POA: Diagnosis not present

## 2020-12-23 DIAGNOSIS — E875 Hyperkalemia: Secondary | ICD-10-CM | POA: Diagnosis not present

## 2020-12-23 DIAGNOSIS — I509 Heart failure, unspecified: Secondary | ICD-10-CM | POA: Diagnosis not present

## 2020-12-23 DIAGNOSIS — N2581 Secondary hyperparathyroidism of renal origin: Secondary | ICD-10-CM | POA: Diagnosis not present

## 2020-12-23 DIAGNOSIS — Z992 Dependence on renal dialysis: Secondary | ICD-10-CM | POA: Diagnosis not present

## 2020-12-25 DIAGNOSIS — N186 End stage renal disease: Secondary | ICD-10-CM | POA: Diagnosis not present

## 2020-12-25 DIAGNOSIS — Z992 Dependence on renal dialysis: Secondary | ICD-10-CM | POA: Diagnosis not present

## 2020-12-27 DIAGNOSIS — N186 End stage renal disease: Secondary | ICD-10-CM | POA: Diagnosis not present

## 2020-12-27 DIAGNOSIS — Z992 Dependence on renal dialysis: Secondary | ICD-10-CM | POA: Diagnosis not present

## 2020-12-30 DIAGNOSIS — N186 End stage renal disease: Secondary | ICD-10-CM | POA: Diagnosis not present

## 2020-12-30 DIAGNOSIS — E875 Hyperkalemia: Secondary | ICD-10-CM | POA: Diagnosis not present

## 2020-12-30 DIAGNOSIS — Z992 Dependence on renal dialysis: Secondary | ICD-10-CM | POA: Diagnosis not present

## 2021-01-01 DIAGNOSIS — N186 End stage renal disease: Secondary | ICD-10-CM | POA: Diagnosis not present

## 2021-01-01 DIAGNOSIS — Z992 Dependence on renal dialysis: Secondary | ICD-10-CM | POA: Diagnosis not present

## 2021-01-01 IMAGING — DX PORTABLE CHEST - 1 VIEW
1 series · 1 of 1 positions shown · non-contrast
Comparison: August 24, 2018

CLINICAL DATA: Chest pain.

EXAM:
PORTABLE CHEST 1 VIEW

[chest ap]
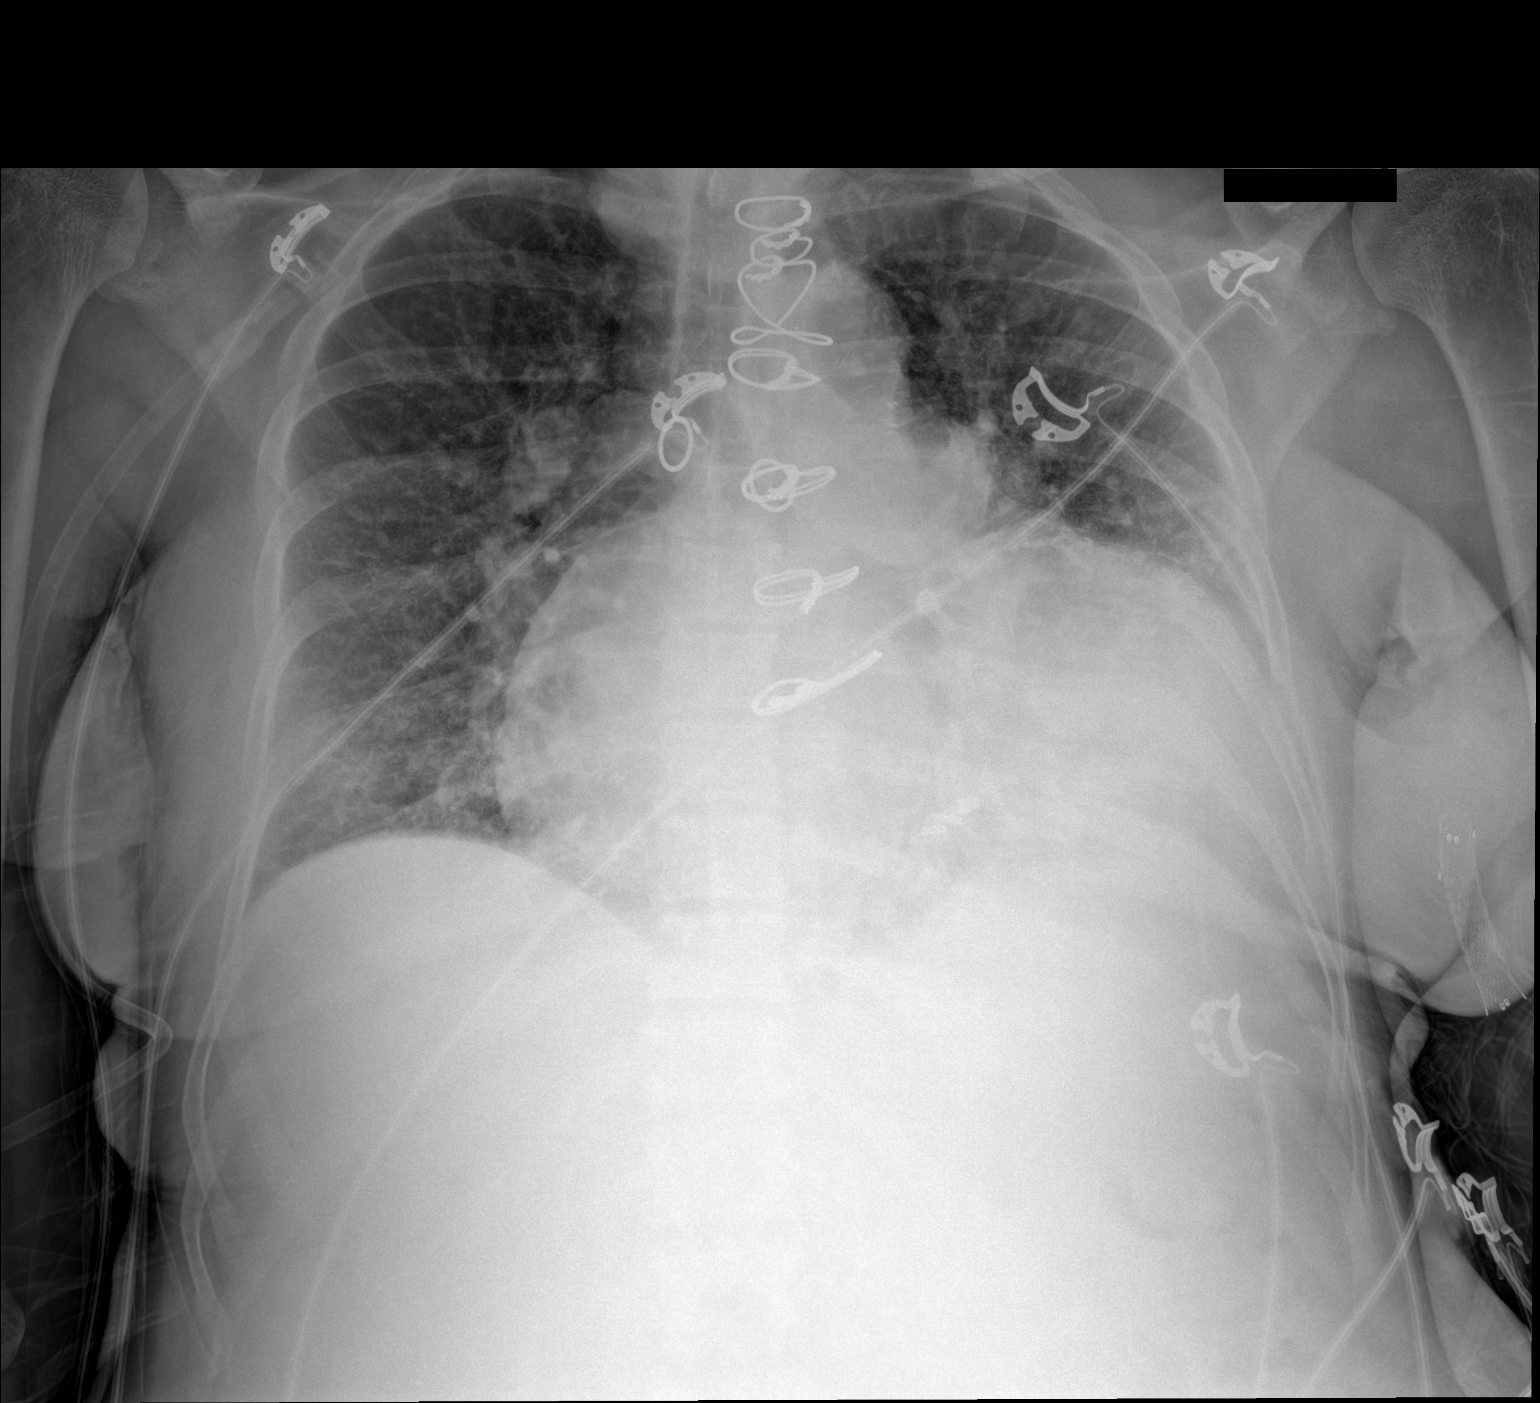

[1 of 1 positions shown; findings below may reference images not displayed]

FINDINGS: Postsurgical changes from CABG.

Enlarged cardiac silhouette. Mediastinal contours appear intact.

Mild pulmonary vascular congestion.

Osseous structures are without acute abnormality. Soft tissues are
grossly normal.
IMPRESSION: Markedly enlarged cardiac silhouette.

Mild pulmonary vascular congestion.

## 2021-01-03 DIAGNOSIS — Z992 Dependence on renal dialysis: Secondary | ICD-10-CM | POA: Diagnosis not present

## 2021-01-03 DIAGNOSIS — N186 End stage renal disease: Secondary | ICD-10-CM | POA: Diagnosis not present

## 2021-01-06 DIAGNOSIS — D509 Iron deficiency anemia, unspecified: Secondary | ICD-10-CM | POA: Diagnosis not present

## 2021-01-06 DIAGNOSIS — N186 End stage renal disease: Secondary | ICD-10-CM | POA: Diagnosis not present

## 2021-01-06 DIAGNOSIS — Z992 Dependence on renal dialysis: Secondary | ICD-10-CM | POA: Diagnosis not present

## 2021-01-08 DIAGNOSIS — N186 End stage renal disease: Secondary | ICD-10-CM | POA: Diagnosis not present

## 2021-01-08 DIAGNOSIS — Z992 Dependence on renal dialysis: Secondary | ICD-10-CM | POA: Diagnosis not present

## 2021-01-10 DIAGNOSIS — N186 End stage renal disease: Secondary | ICD-10-CM | POA: Diagnosis not present

## 2021-01-10 DIAGNOSIS — Z992 Dependence on renal dialysis: Secondary | ICD-10-CM | POA: Diagnosis not present

## 2021-01-12 DIAGNOSIS — N186 End stage renal disease: Secondary | ICD-10-CM | POA: Diagnosis not present

## 2021-01-12 DIAGNOSIS — Z992 Dependence on renal dialysis: Secondary | ICD-10-CM | POA: Diagnosis not present

## 2021-01-13 DIAGNOSIS — Z992 Dependence on renal dialysis: Secondary | ICD-10-CM | POA: Diagnosis not present

## 2021-01-13 DIAGNOSIS — E875 Hyperkalemia: Secondary | ICD-10-CM | POA: Diagnosis not present

## 2021-01-13 DIAGNOSIS — N186 End stage renal disease: Secondary | ICD-10-CM | POA: Diagnosis not present

## 2021-01-15 DIAGNOSIS — Z992 Dependence on renal dialysis: Secondary | ICD-10-CM | POA: Diagnosis not present

## 2021-01-15 DIAGNOSIS — N186 End stage renal disease: Secondary | ICD-10-CM | POA: Diagnosis not present

## 2021-01-17 DIAGNOSIS — N186 End stage renal disease: Secondary | ICD-10-CM | POA: Diagnosis not present

## 2021-01-17 DIAGNOSIS — Z992 Dependence on renal dialysis: Secondary | ICD-10-CM | POA: Diagnosis not present

## 2021-01-20 DIAGNOSIS — N186 End stage renal disease: Secondary | ICD-10-CM | POA: Diagnosis not present

## 2021-01-20 DIAGNOSIS — N2581 Secondary hyperparathyroidism of renal origin: Secondary | ICD-10-CM | POA: Diagnosis not present

## 2021-01-20 DIAGNOSIS — Z992 Dependence on renal dialysis: Secondary | ICD-10-CM | POA: Diagnosis not present

## 2021-01-20 DIAGNOSIS — E875 Hyperkalemia: Secondary | ICD-10-CM | POA: Diagnosis not present

## 2021-01-21 DIAGNOSIS — F17210 Nicotine dependence, cigarettes, uncomplicated: Secondary | ICD-10-CM | POA: Diagnosis not present

## 2021-01-21 DIAGNOSIS — I6389 Other cerebral infarction: Secondary | ICD-10-CM | POA: Diagnosis not present

## 2021-01-21 DIAGNOSIS — I10 Essential (primary) hypertension: Secondary | ICD-10-CM | POA: Diagnosis not present

## 2021-01-21 DIAGNOSIS — I2510 Atherosclerotic heart disease of native coronary artery without angina pectoris: Secondary | ICD-10-CM | POA: Diagnosis not present

## 2021-01-21 DIAGNOSIS — I509 Heart failure, unspecified: Secondary | ICD-10-CM | POA: Diagnosis not present

## 2021-01-21 DIAGNOSIS — I25810 Atherosclerosis of coronary artery bypass graft(s) without angina pectoris: Secondary | ICD-10-CM | POA: Diagnosis not present

## 2021-01-22 DIAGNOSIS — N186 End stage renal disease: Secondary | ICD-10-CM | POA: Diagnosis not present

## 2021-01-22 DIAGNOSIS — Z992 Dependence on renal dialysis: Secondary | ICD-10-CM | POA: Diagnosis not present

## 2021-01-23 DIAGNOSIS — I509 Heart failure, unspecified: Secondary | ICD-10-CM | POA: Diagnosis not present

## 2021-01-24 DIAGNOSIS — Z992 Dependence on renal dialysis: Secondary | ICD-10-CM | POA: Diagnosis not present

## 2021-01-24 DIAGNOSIS — N186 End stage renal disease: Secondary | ICD-10-CM | POA: Diagnosis not present

## 2021-01-27 DIAGNOSIS — N186 End stage renal disease: Secondary | ICD-10-CM | POA: Diagnosis not present

## 2021-01-27 DIAGNOSIS — Z992 Dependence on renal dialysis: Secondary | ICD-10-CM | POA: Diagnosis not present

## 2021-01-28 DIAGNOSIS — N185 Chronic kidney disease, stage 5: Secondary | ICD-10-CM | POA: Diagnosis not present

## 2021-01-28 DIAGNOSIS — E782 Mixed hyperlipidemia: Secondary | ICD-10-CM | POA: Diagnosis not present

## 2021-01-28 DIAGNOSIS — M2550 Pain in unspecified joint: Secondary | ICD-10-CM | POA: Diagnosis not present

## 2021-01-28 DIAGNOSIS — I10 Essential (primary) hypertension: Secondary | ICD-10-CM | POA: Diagnosis not present

## 2021-01-29 DIAGNOSIS — N186 End stage renal disease: Secondary | ICD-10-CM | POA: Diagnosis not present

## 2021-01-29 DIAGNOSIS — Z992 Dependence on renal dialysis: Secondary | ICD-10-CM | POA: Diagnosis not present

## 2021-01-31 ENCOUNTER — Other Ambulatory Visit: Payer: Self-pay

## 2021-01-31 ENCOUNTER — Encounter: Payer: Self-pay | Admitting: Emergency Medicine

## 2021-01-31 ENCOUNTER — Other Ambulatory Visit (INDEPENDENT_AMBULATORY_CARE_PROVIDER_SITE_OTHER): Payer: Self-pay | Admitting: Nurse Practitioner

## 2021-01-31 DIAGNOSIS — N186 End stage renal disease: Secondary | ICD-10-CM

## 2021-01-31 DIAGNOSIS — R739 Hyperglycemia, unspecified: Secondary | ICD-10-CM | POA: Diagnosis not present

## 2021-01-31 DIAGNOSIS — E162 Hypoglycemia, unspecified: Secondary | ICD-10-CM | POA: Diagnosis not present

## 2021-01-31 DIAGNOSIS — Z8249 Family history of ischemic heart disease and other diseases of the circulatory system: Secondary | ICD-10-CM

## 2021-01-31 DIAGNOSIS — Z8619 Personal history of other infectious and parasitic diseases: Secondary | ICD-10-CM

## 2021-01-31 DIAGNOSIS — K529 Noninfective gastroenteritis and colitis, unspecified: Principal | ICD-10-CM | POA: Diagnosis present

## 2021-01-31 DIAGNOSIS — Z803 Family history of malignant neoplasm of breast: Secondary | ICD-10-CM

## 2021-01-31 DIAGNOSIS — Z881 Allergy status to other antibiotic agents status: Secondary | ICD-10-CM

## 2021-01-31 DIAGNOSIS — Z8041 Family history of malignant neoplasm of ovary: Secondary | ICD-10-CM

## 2021-01-31 DIAGNOSIS — Z79890 Hormone replacement therapy (postmenopausal): Secondary | ICD-10-CM

## 2021-01-31 DIAGNOSIS — I959 Hypotension, unspecified: Secondary | ICD-10-CM | POA: Diagnosis not present

## 2021-01-31 DIAGNOSIS — Z91040 Latex allergy status: Secondary | ICD-10-CM

## 2021-01-31 DIAGNOSIS — Z955 Presence of coronary angioplasty implant and graft: Secondary | ICD-10-CM

## 2021-01-31 DIAGNOSIS — R531 Weakness: Secondary | ICD-10-CM | POA: Diagnosis not present

## 2021-01-31 DIAGNOSIS — F17210 Nicotine dependence, cigarettes, uncomplicated: Secondary | ICD-10-CM | POA: Diagnosis present

## 2021-01-31 DIAGNOSIS — R1084 Generalized abdominal pain: Secondary | ICD-10-CM | POA: Diagnosis not present

## 2021-01-31 DIAGNOSIS — R112 Nausea with vomiting, unspecified: Secondary | ICD-10-CM | POA: Diagnosis not present

## 2021-01-31 DIAGNOSIS — I5022 Chronic systolic (congestive) heart failure: Secondary | ICD-10-CM | POA: Diagnosis present

## 2021-01-31 DIAGNOSIS — I252 Old myocardial infarction: Secondary | ICD-10-CM

## 2021-01-31 DIAGNOSIS — Z841 Family history of disorders of kidney and ureter: Secondary | ICD-10-CM

## 2021-01-31 DIAGNOSIS — D631 Anemia in chronic kidney disease: Secondary | ICD-10-CM | POA: Diagnosis present

## 2021-01-31 DIAGNOSIS — I10 Essential (primary) hypertension: Secondary | ICD-10-CM | POA: Diagnosis not present

## 2021-01-31 DIAGNOSIS — I2510 Atherosclerotic heart disease of native coronary artery without angina pectoris: Secondary | ICD-10-CM | POA: Diagnosis present

## 2021-01-31 DIAGNOSIS — K7460 Unspecified cirrhosis of liver: Secondary | ICD-10-CM | POA: Diagnosis present

## 2021-01-31 DIAGNOSIS — Z8673 Personal history of transient ischemic attack (TIA), and cerebral infarction without residual deficits: Secondary | ICD-10-CM

## 2021-01-31 DIAGNOSIS — I444 Left anterior fascicular block: Secondary | ICD-10-CM | POA: Diagnosis present

## 2021-01-31 DIAGNOSIS — N2581 Secondary hyperparathyroidism of renal origin: Secondary | ICD-10-CM | POA: Diagnosis present

## 2021-01-31 DIAGNOSIS — E039 Hypothyroidism, unspecified: Secondary | ICD-10-CM | POA: Diagnosis present

## 2021-01-31 DIAGNOSIS — E875 Hyperkalemia: Secondary | ICD-10-CM | POA: Diagnosis present

## 2021-01-31 DIAGNOSIS — I499 Cardiac arrhythmia, unspecified: Secondary | ICD-10-CM | POA: Diagnosis not present

## 2021-01-31 DIAGNOSIS — Z885 Allergy status to narcotic agent status: Secondary | ICD-10-CM

## 2021-01-31 DIAGNOSIS — E785 Hyperlipidemia, unspecified: Secondary | ICD-10-CM | POA: Diagnosis present

## 2021-01-31 DIAGNOSIS — Z79899 Other long term (current) drug therapy: Secondary | ICD-10-CM

## 2021-01-31 DIAGNOSIS — Z992 Dependence on renal dialysis: Secondary | ICD-10-CM

## 2021-01-31 DIAGNOSIS — I480 Paroxysmal atrial fibrillation: Secondary | ICD-10-CM | POA: Diagnosis present

## 2021-01-31 DIAGNOSIS — I132 Hypertensive heart and chronic kidney disease with heart failure and with stage 5 chronic kidney disease, or end stage renal disease: Secondary | ICD-10-CM | POA: Diagnosis present

## 2021-01-31 DIAGNOSIS — Z7901 Long term (current) use of anticoagulants: Secondary | ICD-10-CM

## 2021-01-31 DIAGNOSIS — Z951 Presence of aortocoronary bypass graft: Secondary | ICD-10-CM

## 2021-01-31 DIAGNOSIS — Z20822 Contact with and (suspected) exposure to covid-19: Secondary | ICD-10-CM | POA: Diagnosis present

## 2021-01-31 LAB — CBC
HCT: 48 % — ABNORMAL HIGH (ref 36.0–46.0)
Hemoglobin: 16.3 g/dL — ABNORMAL HIGH (ref 12.0–15.0)
MCH: 35.2 pg — ABNORMAL HIGH (ref 26.0–34.0)
MCHC: 34 g/dL (ref 30.0–36.0)
MCV: 103.7 fL — ABNORMAL HIGH (ref 80.0–100.0)
Platelets: 118 10*3/uL — ABNORMAL LOW (ref 150–400)
RBC: 4.63 MIL/uL (ref 3.87–5.11)
RDW: 14.6 % (ref 11.5–15.5)
WBC: 7.5 10*3/uL (ref 4.0–10.5)
nRBC: 0 % (ref 0.0–0.2)

## 2021-01-31 LAB — BASIC METABOLIC PANEL
Anion gap: 13 (ref 5–15)
BUN: 36 mg/dL — ABNORMAL HIGH (ref 6–20)
CO2: 27 mmol/L (ref 22–32)
Calcium: 9.2 mg/dL (ref 8.9–10.3)
Chloride: 96 mmol/L — ABNORMAL LOW (ref 98–111)
Creatinine, Ser: 6.29 mg/dL — ABNORMAL HIGH (ref 0.44–1.00)
GFR, Estimated: 7 mL/min — ABNORMAL LOW (ref >60)
Glucose, Bld: 90 mg/dL (ref 70–99)
Potassium: 5.6 mmol/L — ABNORMAL HIGH (ref 3.5–5.1)
Sodium: 136 mmol/L (ref 135–145)

## 2021-01-31 NOTE — ED Notes (Signed)
Pt reporting she is feeling short of breath. Pt reports wearing 2L of Oxygen at home all the time.

## 2021-01-31 NOTE — ED Triage Notes (Signed)
Arrived [facility] from home. Pt had dialysis today.  Here for generalized weakness per EMS.  VSS with EMS.     Pt c/o generalized weakness and pain.  No fevers.  Alert and oriented. NAD. VSS. Unlabored. Pt started feeling this way today.

## 2021-02-01 ENCOUNTER — Emergency Department: Payer: Medicare Other

## 2021-02-01 ENCOUNTER — Inpatient Hospital Stay
Admission: EM | Admit: 2021-02-01 | Discharge: 2021-02-03 | DRG: 391 | Disposition: A | Discharge status: Home / Self Care | Payer: Medicare Other | Attending: Hospitalist | Admitting: Hospitalist

## 2021-02-01 DIAGNOSIS — E785 Hyperlipidemia, unspecified: Secondary | ICD-10-CM | POA: Diagnosis not present

## 2021-02-01 DIAGNOSIS — R1084 Generalized abdominal pain: Secondary | ICD-10-CM | POA: Diagnosis not present

## 2021-02-01 DIAGNOSIS — E162 Hypoglycemia, unspecified: Secondary | ICD-10-CM | POA: Diagnosis not present

## 2021-02-01 DIAGNOSIS — Z951 Presence of aortocoronary bypass graft: Secondary | ICD-10-CM | POA: Diagnosis not present

## 2021-02-01 DIAGNOSIS — Z992 Dependence on renal dialysis: Secondary | ICD-10-CM

## 2021-02-01 DIAGNOSIS — K529 Noninfective gastroenteritis and colitis, unspecified: Secondary | ICD-10-CM

## 2021-02-01 DIAGNOSIS — R1110 Vomiting, unspecified: Secondary | ICD-10-CM | POA: Diagnosis not present

## 2021-02-01 DIAGNOSIS — R112 Nausea with vomiting, unspecified: Secondary | ICD-10-CM

## 2021-02-01 DIAGNOSIS — I5022 Chronic systolic (congestive) heart failure: Secondary | ICD-10-CM | POA: Diagnosis not present

## 2021-02-01 DIAGNOSIS — N2581 Secondary hyperparathyroidism of renal origin: Secondary | ICD-10-CM | POA: Diagnosis not present

## 2021-02-01 DIAGNOSIS — D631 Anemia in chronic kidney disease: Secondary | ICD-10-CM | POA: Diagnosis not present

## 2021-02-01 DIAGNOSIS — I2510 Atherosclerotic heart disease of native coronary artery without angina pectoris: Secondary | ICD-10-CM | POA: Diagnosis not present

## 2021-02-01 DIAGNOSIS — K7460 Unspecified cirrhosis of liver: Secondary | ICD-10-CM | POA: Diagnosis not present

## 2021-02-01 DIAGNOSIS — I509 Heart failure, unspecified: Secondary | ICD-10-CM | POA: Diagnosis not present

## 2021-02-01 DIAGNOSIS — I5020 Unspecified systolic (congestive) heart failure: Secondary | ICD-10-CM | POA: Diagnosis present

## 2021-02-01 DIAGNOSIS — I252 Old myocardial infarction: Secondary | ICD-10-CM | POA: Diagnosis not present

## 2021-02-01 DIAGNOSIS — R531 Weakness: Secondary | ICD-10-CM

## 2021-02-01 DIAGNOSIS — E039 Hypothyroidism, unspecified: Secondary | ICD-10-CM | POA: Diagnosis not present

## 2021-02-01 DIAGNOSIS — Z7901 Long term (current) use of anticoagulants: Secondary | ICD-10-CM | POA: Diagnosis not present

## 2021-02-01 DIAGNOSIS — Z803 Family history of malignant neoplasm of breast: Secondary | ICD-10-CM | POA: Diagnosis not present

## 2021-02-01 DIAGNOSIS — I120 Hypertensive chronic kidney disease with stage 5 chronic kidney disease or end stage renal disease: Secondary | ICD-10-CM | POA: Diagnosis not present

## 2021-02-01 DIAGNOSIS — I517 Cardiomegaly: Secondary | ICD-10-CM | POA: Diagnosis not present

## 2021-02-01 DIAGNOSIS — Z885 Allergy status to narcotic agent status: Secondary | ICD-10-CM | POA: Diagnosis not present

## 2021-02-01 DIAGNOSIS — I132 Hypertensive heart and chronic kidney disease with heart failure and with stage 5 chronic kidney disease, or end stage renal disease: Secondary | ICD-10-CM | POA: Diagnosis not present

## 2021-02-01 DIAGNOSIS — I480 Paroxysmal atrial fibrillation: Secondary | ICD-10-CM | POA: Diagnosis present

## 2021-02-01 DIAGNOSIS — Z8249 Family history of ischemic heart disease and other diseases of the circulatory system: Secondary | ICD-10-CM | POA: Diagnosis not present

## 2021-02-01 DIAGNOSIS — E875 Hyperkalemia: Secondary | ICD-10-CM | POA: Diagnosis not present

## 2021-02-01 DIAGNOSIS — Z8041 Family history of malignant neoplasm of ovary: Secondary | ICD-10-CM | POA: Diagnosis not present

## 2021-02-01 DIAGNOSIS — Z20822 Contact with and (suspected) exposure to covid-19: Secondary | ICD-10-CM | POA: Diagnosis not present

## 2021-02-01 DIAGNOSIS — N186 End stage renal disease: Secondary | ICD-10-CM | POA: Diagnosis not present

## 2021-02-01 DIAGNOSIS — R109 Unspecified abdominal pain: Secondary | ICD-10-CM | POA: Diagnosis not present

## 2021-02-01 DIAGNOSIS — R079 Chest pain, unspecified: Secondary | ICD-10-CM | POA: Diagnosis not present

## 2021-02-01 DIAGNOSIS — I959 Hypotension, unspecified: Secondary | ICD-10-CM | POA: Diagnosis not present

## 2021-02-01 DIAGNOSIS — Z91040 Latex allergy status: Secondary | ICD-10-CM | POA: Diagnosis not present

## 2021-02-01 LAB — TROPONIN I (HIGH SENSITIVITY)
Troponin I (High Sensitivity): 66 ng/L — ABNORMAL HIGH (ref <18)
Troponin I (High Sensitivity): 58 ng/L — ABNORMAL HIGH (ref <18)

## 2021-02-01 LAB — LACTIC ACID, PLASMA: Lactic Acid, Venous: 1.6 mmol/L (ref 0.5–1.9)

## 2021-02-01 LAB — RESP PANEL BY RT-PCR (FLU A&B, COVID) ARPGX2
Influenza A by PCR: NEGATIVE
Influenza B by PCR: NEGATIVE
SARS Coronavirus 2 by RT PCR: NEGATIVE

## 2021-02-01 LAB — LIPASE, BLOOD: Lipase: 36 U/L (ref 11–51)

## 2021-02-01 MED ORDER — SODIUM ZIRCONIUM CYCLOSILICATE 10 G PO PACK
10.0000 g | PACK | Freq: Two times a day (BID) | ORAL | Status: DC
  Administered 2021-02-01 – 2021-02-02 (×2): 10 g via ORAL
  Filled 2021-02-01 (×4): qty 1

## 2021-02-01 MED ORDER — ONDANSETRON HCL 4 MG PO TABS: 4.0000 mg | ORAL_TABLET | Freq: Four times a day (QID) | ORAL | Status: DC | PRN

## 2021-02-01 MED ORDER — ACETAMINOPHEN 325 MG PO TABS: 650.0000 mg | ORAL_TABLET | Freq: Four times a day (QID) | ORAL | Status: DC | PRN

## 2021-02-01 MED ORDER — MORPHINE SULFATE (PF) 2 MG/ML IV SOLN
2.0000 mg | INTRAVENOUS | Status: DC | PRN
Start: 2021-02-01 — End: 2021-02-01

## 2021-02-01 MED ORDER — PIPERACILLIN-TAZOBACTAM 3.375 G IVPB 30 MIN
3.3750 g | Freq: Once | INTRAVENOUS | Status: AC
  Administered 2021-02-01: 3.375 g via INTRAVENOUS
  Filled 2021-02-01: qty 50

## 2021-02-01 MED ORDER — IOHEXOL 350 MG/ML SOLN
75.0000 mL | Freq: Once | INTRAVENOUS | Status: AC | PRN
  Administered 2021-02-01: 75 mL via INTRAVENOUS

## 2021-02-01 MED ORDER — ACETAMINOPHEN 650 MG RE SUPP: 650.0000 mg | Freq: Four times a day (QID) | RECTAL | Status: DC | PRN

## 2021-02-01 MED ORDER — APIXABAN 5 MG PO TABS
5.0000 mg | ORAL_TABLET | Freq: Two times a day (BID) | ORAL | Status: DC
  Administered 2021-02-01 – 2021-02-02 (×4): 5 mg via ORAL
  Filled 2021-02-01 (×4): qty 1

## 2021-02-01 MED ORDER — ONDANSETRON HCL 4 MG/2ML IJ SOLN
4.0000 mg | Freq: Once | INTRAMUSCULAR | Status: AC
  Administered 2021-02-01: 4 mg via INTRAVENOUS
  Filled 2021-02-01: qty 2

## 2021-02-01 MED ORDER — HYDROCODONE-ACETAMINOPHEN 5-325 MG PO TABS
1.0000 | ORAL_TABLET | ORAL | Status: DC | PRN
  Administered 2021-02-01: 1 via ORAL
  Filled 2021-02-01: qty 1

## 2021-02-01 MED ORDER — FENTANYL CITRATE (PF) 100 MCG/2ML IJ SOLN
50.0000 ug | Freq: Once | INTRAMUSCULAR | Status: AC
  Administered 2021-02-01: 50 ug via INTRAVENOUS
  Filled 2021-02-01: qty 2

## 2021-02-01 MED ORDER — ACETAMINOPHEN 325 MG PO TABS
650.0000 mg | ORAL_TABLET | Freq: Once | ORAL | Status: AC
  Administered 2021-02-01: 650 mg via ORAL
  Filled 2021-02-01: qty 2

## 2021-02-01 MED ORDER — SODIUM CHLORIDE 0.9 % IV BOLUS
500.0000 mL | Freq: Once | INTRAVENOUS | Status: AC
  Administered 2021-02-01: 500 mL via INTRAVENOUS

## 2021-02-01 MED ORDER — ONDANSETRON HCL 4 MG/2ML IJ SOLN: 4.0000 mg | Freq: Four times a day (QID) | INTRAMUSCULAR | Status: DC | PRN

## 2021-02-01 MED ORDER — HYDROCODONE-ACETAMINOPHEN 5-325 MG PO TABS
1.0000 | ORAL_TABLET | Freq: Four times a day (QID) | ORAL | Status: DC | PRN
Start: 2021-02-01 — End: 2021-02-03
  Administered 2021-02-01 – 2021-02-02 (×3): 1 via ORAL
  Filled 2021-02-01 (×3): qty 1

## 2021-02-01 NOTE — ED Notes (Signed)
Pt. To CT

## 2021-02-01 NOTE — ED Notes (Signed)
MD states no need for second LA, or second blood culture

## 2021-02-01 NOTE — ED Notes (Signed)
Request made for transport to the floor ?

## 2021-02-01 NOTE — ED Notes (Signed)
Pt. Took her [place] off, and forgot to put it back on, O2 sat 82%, pt. Reminded to put  back on, O2 recovered to 98%.

## 2021-02-01 NOTE — ED Notes (Signed)
Changed pt to a recliner, provider a new oxygen tank. A warm blanket

## 2021-02-01 NOTE — ED Notes (Signed)
Pt. Took her [place] off, and forgot to put it back on, O2 sat 84%, pt. Reminded to put [place] back on, O2 recovered to 98%.

## 2021-02-01 NOTE — ED Notes (Signed)
Patient resting comfortably in recliner at this time.

## 2021-02-01 NOTE — ED Notes (Signed)
Meal tray delivered.

## 2021-02-01 NOTE — ED Notes (Signed)
MD notified of pt's BP. Will continue to monitor.

## 2021-02-01 NOTE — Progress Notes (Signed)
[facility]  ROUNDING NOTE   Subjective:   [name] is a 60 y.o. female with medical history of Atrial fib, CAD s/p CABG, heart failure and ESRD on HD. Patient presents to ED with complaints of abdominal pain. She has been admitted with Acute colitis [K52.9]  Patient is known to our practice and receives dialysis at [facility] supervised by Dr Candiss Norse. Patient seen resting on stretcher. Complaining of abdominal pain that began the previous day. She also reports vomiting started the day before. Denies shortness of breath and cough. Denies diarrhea. She has continued to go to dialysis on regular schedule. She received dialysis yesterday.    Objective:  Vital signs in last 24 hours:  Temp:  [98.6 F (37 C)] 98.6 F (37 C) (08/19 2127) Pulse Rate:  [63-94] 63 (08/20 0830) Resp:  [12-26] 17 (08/20 0830) BP: (79-105)/(53-74) 105/68 (08/20 0830) SpO2:  [82 %-99 %] 96 % (08/20 0830) Weight:  [72.6 kg] 72.6 kg (08/19 2127)  Weight change:  Filed Weights   01/31/21 2127  Weight: 72.6 kg    Intake/Output: I/O last 3 completed shifts: In: 616.8 [IV Piggyback:616.8] Out: -    Intake/Output this shift:  No intake/output data recorded.  Physical Exam: General: NAD, resting on stretcher  Head: Normocephalic, atraumatic. Moist oral mucosal membranes  Eyes: Anicteric  Lungs:  Clear to auscultation, normal effort  Heart: Regular rate and rhythm  Abdomen:  Soft, tender midline  Extremities:  no peripheral edema.  Neurologic: Nonfocal, moving all four extremities  Skin: No lesions  Access: Lt AVG    Basic Metabolic Panel: Recent Labs  Lab 01/31/21 2139  NA 136  K 5.6*  CL 96*  CO2 27  GLUCOSE 90  BUN 36*  CREATININE 6.29*  CALCIUM 9.2    Liver Function Tests: No results for input(s): AST, ALT, ALKPHOS, BILITOT, PROT, ALBUMIN in the last 168 hours. No results for input(s): LIPASE, AMYLASE in the last 168 hours. No results for input(s): AMMONIA  in the last 168 hours.  CBC: Recent Labs  Lab 01/31/21 2139  WBC 7.5  HGB 16.3*  HCT 48.0*  MCV 103.7*  PLT 118*    Cardiac Enzymes: No results for input(s): CKTOTAL, CKMB, CKMBINDEX, TROPONINI in the last 168 hours.  BNP: Invalid input(s): POCBNP  CBG: No results for input(s): GLUCAP in the last 168 hours.  Microbiology: Results for orders placed or performed during the hospital encounter of 02/01/21  Culture, blood (routine x 2)     Status: None (Preliminary result)   Collection Time: 02/01/21  2:20 AM   Specimen: BLOOD  Result Value Ref Range Status   Specimen Description BLOOD RIGHT WRIST  Final   Special Requests   Final    BOTTLES DRAWN AEROBIC AND ANAEROBIC Blood Culture results may not be optimal due to an inadequate volume of blood received in culture bottles   Culture   Final    NO GROWTH < 12 HOURS Performed at [facility], [address] [place], [place] [address]    Report Status PENDING  Incomplete  Resp Panel by RT-PCR (Flu A&B, Covid) Nasopharyngeal Swab     Status: None   Collection Time: 02/01/21  6:27 AM   Specimen: Nasopharyngeal Swab; Nasopharyngeal(NP) swabs in vial transport medium  Result Value Ref Range Status   SARS Coronavirus 2 by RT PCR NEGATIVE NEGATIVE Final    Comment: (NOTE) SARS-CoV-2 target nucleic acids are NOT DETECTED.  The SARS-CoV-2 RNA is  generally detectable in upper respiratory specimens during the acute phase of infection. The lowest concentration of SARS-CoV-2 viral copies this assay can detect is 138 copies/mL. A negative result does not preclude SARS-Cov-2 infection and should not be used as the sole basis for treatment or other patient management decisions. A negative result may occur with  improper specimen collection/handling, submission of specimen other than nasopharyngeal swab, presence of viral mutation(s) within the areas targeted by this assay, and inadequate number of viral copies(<138  copies/mL). A negative result must be combined with clinical observations, patient history, and epidemiological information. The expected result is Negative.  Fact Sheet for Patients:  EntrepreneurPulse.com.au  Fact Sheet for Healthcare Providers:  IncredibleEmployment.be  This test is no t yet approved or cleared by the [place] FDA and  has been authorized for detection and/or diagnosis of SARS-CoV-2 by FDA under an Emergency Use Authorization (EUA). This EUA will remain  in effect (meaning this test can be used) for the duration of the COVID-19 declaration under Section 564(b)(1) of the Act, 21 U.S.C.section 360bbb-3(b)(1), unless the authorization is terminated  or revoked sooner.       Influenza A by PCR NEGATIVE NEGATIVE Final   Influenza B by PCR NEGATIVE NEGATIVE Final    Comment: (NOTE) The Xpert Xpress SARS-CoV-2/FLU/RSV plus assay is intended as an aid in the diagnosis of influenza from Nasopharyngeal swab specimens and should not be used as a sole basis for treatment. Nasal washings and aspirates are unacceptable for Xpert Xpress SARS-CoV-2/FLU/RSV testing.  Fact Sheet for Patients: EntrepreneurPulse.com.au  Fact Sheet for Healthcare Providers: IncredibleEmployment.be  This test is not yet approved or cleared by the [place] FDA and has been authorized for detection and/or diagnosis of SARS-CoV-2 by FDA under an Emergency Use Authorization (EUA). This EUA will remain in effect (meaning this test can be used) for the duration of the COVID-19 declaration under Section 564(b)(1) of the Act, 21 U.S.C. section 360bbb-3(b)(1), unless the authorization is terminated or revoked.  Performed at [facility], [address] [place], [place] [address]     Coagulation Studies: No results for input(s): LABPROT, INR in the last 72 hours.  Urinalysis: No results for input(s):  COLORURINE, LABSPEC, PHURINE, GLUCOSEU, HGBUR, BILIRUBINUR, KETONESUR, PROTEINUR, UROBILINOGEN, NITRITE, LEUKOCYTESUR in the last 72 hours.  Invalid input(s): APPERANCEUR    Imaging: CT Abdomen Pelvis W Contrast  Result Date: 02/01/2021 CLINICAL DATA:  Nausea, vomiting, right lower quadrant abdominal pain EXAM: CT ABDOMEN AND PELVIS WITH CONTRAST TECHNIQUE: Multidetector CT imaging of the abdomen and pelvis was performed using the standard protocol following bolus administration of intravenous contrast. CONTRAST:  75 cc Omnipaque 350 COMPARISON:  None. FINDINGS: Lower chest: Mild bibasilar atelectasis. Moderate cardiomegaly. Extensive coronary artery calcification Hepatobiliary: Cholelithiasis without pericholecystic inflammatory change. No intra or extrahepatic biliary ductal dilation. Liver unremarkable. Pancreas: Unremarkable Spleen: Unremarkable Adrenals/Urinary Tract: The adrenal glands are unremarkable. Marked bilateral renal cortical atrophy is present in keeping with changes of chronic renal failure. The bladder is decompressed and is unremarkable. Stomach/Bowel: The stomach and small bowel are unremarkable. There is mild circumferential bowel wall thickening involving the cecum with mild pericolonic soft tissue infiltration noted, best seen on image # 57/2. There is mild infiltration of the adjacent omental fat. In the acute setting, this may represent a focal infectious or inflammatory colitis with adjacent inflammatory change. Alternatively, this may represent a a focal mural mass with transmural infiltration. The terminal is unremarkable. The adjacent appendix is well visualized and is  normal. There is no evidence of obstruction or perforation. No free intraperitoneal gas or fluid Vascular/Lymphatic: There is extensive aortoiliac atherosclerotic calcification. No aortic aneurysm. No pathologic adenopathy within the abdomen and pelvis. Reproductive: Multiple calcified uterine fibroids are again  identified. The pelvic organs are otherwise unremarkable. Other: Tiny fat containing umbilical hernia.  Rectum unremarkable. Musculoskeletal: No acute bone abnormality. No lytic or blastic bone lesions. Diffuse sclerosis of the osseous structures is in keeping with renal osteodystrophy. IMPRESSION: Mild focal cecal bowel wall thickening and pericolonic soft tissue infiltration. In the acute setting, this may represent a focal infectious or inflammatory colitis in adjacent inflammatory change. However, an infiltrative mural mass could appear similarly with pericolonic changes representing transmural extension of disease. Correlation with recent screening endoscopy is recommended. No evidence of obstruction or focal inflammation. Cholelithiasis. Changes in keeping with chronic renal failure. In this setting, diffuse osseous sclerosis may reflect changes of renal osteodystrophy. Extensive coronary artery calcification.  Moderate cardiomegaly. Aortic Atherosclerosis (ICD10-I70.0). Electronically Signed   By: Fidela Salisbury M.D.   On: 02/01/2021 03:39   DG Chest Port 1 View  Result Date: 02/01/2021 CLINICAL DATA:  Chest pain. EXAM: PORTABLE CHEST 1 VIEW COMPARISON:  Chest radiograph dated 09/30/2020. FINDINGS: Cardiomegaly with mild vascular congestion. No focal consolidation, pleural effusion, or pneumothorax. Median sternotomy wires and coronary stent. No acute osseous pathology. Vascular stent in the left upper extremity. IMPRESSION: Cardiomegaly with mild vascular congestion. No focal consolidation. Electronically Signed   By: Anner Crete M.D.   On: 02/01/2021 02:41     Medications:     apixaban  5 mg Oral BID   acetaminophen **OR** acetaminophen, HYDROcodone-acetaminophen, ondansetron **OR** ondansetron (ZOFRAN) IV  Assessment/ Plan:  [name] is a 60 y.o.  female with medical history of Atrial fib, CAD s/p CABG, heart failure and ESRD on HD. Patient presents to ED with complaints of  abdominal pain. She has been admitted with Acute colitis [K52.9]   End stage renal disease on HD Will maintain outpatient schedule if possible Received dialysis Friday Will schedule dialysis on Monday  2. Anemia of chronic kidney disease Lab Results  Component Value Date   HGB 16.3 (H) 01/31/2021    Hgb above goal  3. Secondary Hyperparathyroidism:  Lab Results  Component Value Date   PTH 627 (H) 07/26/2020   CALCIUM 9.2 01/31/2021   PHOS 4.0 07/26/2020    Calcium and phosphorus at goal Continue Renvela  4. Diastolic heart failure ECHO on 07/26/20 shows EF 25-30%    LOS: 0   8/20/20229:10 AM

## 2021-02-01 NOTE — ED Notes (Signed)
Patient ok to be transported to inpatient unit per [facility], inpatient RN. Handoff in process. Transport requested.

## 2021-02-01 NOTE — ED Notes (Signed)
Pt reports aching all over

## 2021-02-01 NOTE — ED Provider Notes (Signed)
[facility] Emergency Department Provider Note   ____________________________________________   Event Date/Time   First MD Initiated Contact with Patient 02/01/21 0149     (approximate)  I have reviewed the triage vital signs and the nursing notes.   HISTORY  Chief Complaint Weakness    HPI [name] is a 60 y.o. female brought to the ED via EMS from home with a chief complaint of generalized weakness, chest and abdominal pain since yesterday after dialysis.  Patient with a history of CAD, CHF, ESRD on HD M/W/F, PAF on Eliquis.  Reports associated nausea and vomiting.  Denies fever, cough, shortness of breath, diarrhea.  Does not make urine.      Past Medical History:  Diagnosis Date  . Anemia    chronic disease  . CHF (congestive heart failure) ([place])   . Coronary artery disease   . Heart failure ([place])   . Hepatitis    history of hep c  . Hyperlipidemia   . Hypertension   . Myocardial infarction ([place])   . Paroxysmal atrial fibrillation (HCC)   . Renal failure   . Renal insufficiency   . Stroke [facility]) 2011    Patient Active Problem List   Diagnosis Date Noted  . Paroxysmal atrial fibrillation (HCC)   . Hypothyroidism   . Subdural hematoma ([place]) 07/25/2020  . Syncope and collapse 07/25/2020  . ESRD (end stage renal disease) ([place]) 01/30/2019  . Unstable angina ([place]) 01/23/2019  . Non-STEMI (non-ST elevated myocardial infarction) ([place]) 08/07/2018  . Pneumonia 01/31/2018  . Swelling of limb 09/22/2016  . Kidney dialysis as the cause of abnormal reaction of the patient, or of later complication, without mention of misadventure at the time of the procedure (CODE) 03/24/2016  . Hyperkalemia 11/13/2014  . Generalized weakness 10/15/2014  . Chest pain 10/15/2014  . ESRD on hemodialysis ([place]) 10/15/2014  . Congestive heart failure (CHF) ([place]) 10/15/2014  . HTN (hypertension) 10/15/2014  . Sternal wound dehiscence 04/12/2014  . Post  pericardiotomy syndrome 04/12/2014  . Pericardial effusion 04/12/2014  . History of delirium 04/12/2014  . H/O atrial flutter 04/12/2014  . Delayed surgical wound healing 04/12/2014  . Chest wall pain following surgery 04/12/2014  . S/P CABG (coronary artery bypass graft) 04/02/2014  . Lactic acidosis 03/26/2014  . Arteriosclerotic dementia ([place]) 03/25/2014  . Atrial flutter by electrocardiogram ([place]) 03/22/2014  . Superficial incisional surgical site infection 03/14/2014  . Postoperative anemia due to acute blood loss 02/27/2014  . Obesity 02/27/2014  . H/O: substance abuse ([place]) 02/27/2014  . Anemia of chronic renal failure 02/27/2014  . Acute postoperative pain 02/27/2014  . Leukocytosis 02/26/2014  . Hypotension 02/24/2014  . Exhausted vascular access 02/24/2014  . Anemia of chronic disease 02/24/2014  . Stroke ([place]) 02/21/2014  . S/P CABG x 3 02/21/2014  . Dialysis patient ([place]) 02/21/2014  . Hyperlipidemia 02/13/2014  . History of hepatitis C 02/13/2014  . HFrEF (heart failure with reduced ejection fraction) ([place]) 02/13/2014  . H/O stroke without residual deficits 02/13/2014  . ESRD (end stage renal disease) on dialysis ([place]) 02/13/2014  . End stage renal disease with hypertension ([place]) 02/13/2014  . CAD, multiple vessel 02/13/2014  . Retrosternal chest pain 05/30/2013  . Gallstones 05/29/2013    Past Surgical History:  Procedure Laterality Date  . A/V FISTULAGRAM Left 03/30/2019   Procedure: A/V FISTULAGRAM;  Surgeon: Algernon Huxley, MD;  Location: [place] CV LAB;  Service: Cardiovascular;  Laterality: Left;  . A/V  FISTULAGRAM Left 10/06/2019   Procedure: A/V FISTULAGRAM;  Surgeon: Algernon Huxley, MD;  Location: [place] CV LAB;  Service: Cardiovascular;  Laterality: Left;  . CORONARY ANGIOPLASTY WITH STENT PLACEMENT  2013  . CORONARY ARTERY BYPASS GRAFT    . DIALYSIS FISTULA CREATION    . FLEXIBLE SIGMOIDOSCOPY N/A 02/23/2019   Procedure: FLEXIBLE  SIGMOIDOSCOPY;  Surgeon: Lollie Sails, MD;  Location: [facility] ENDOSCOPY;  Service: Endoscopy;  Laterality: N/A;  . LEFT HEART CATH AND CORS/GRAFTS ANGIOGRAPHY N/A 08/08/2018   Procedure: LEFT HEART CATH AND CORS/GRAFTS ANGIOGRAPHY;  Surgeon: Dionisio David, MD;  Location: [place] CV LAB;  Service: Cardiovascular;  Laterality: N/A;  . LEFT HEART CATH AND CORS/GRAFTS ANGIOGRAPHY N/A 01/30/2019   Procedure: LEFT HEART CATH AND CORS/GRAFTS ANGIOGRAPHY;  Surgeon: Dionisio David, MD;  Location: [facility] CV LAB;  Service: Cardiovascular;  Laterality: N/A;    Prior to Admission medications   Medication Sig Start Date End Date Taking? Authorizing Provider  amiodarone (PACERONE) 200 MG tablet Take 200 mg by mouth daily. 07/30/20   [provider]  apixaban (ELIQUIS) 5 MG TABS tablet Per instructions TWICE DAILY  (route: oral) 09/11/19   [provider]  carvedilol (COREG) 6.25 MG tablet Take 6.25 mg by mouth 2 (two) times daily. 08/13/20   [provider]  cinacalcet (SENSIPAR) 90 MG tablet Take 90 mg by mouth daily.    [provider]  hydrOXYzine (ATARAX/VISTARIL) 50 MG tablet Take 50 mg by mouth 2 (two) times daily as needed for itching.     [provider]  levothyroxine (SYNTHROID) 75 MCG tablet Take 75 mcg by mouth daily before breakfast.  11/02/18   [provider]  lidocaine (LIDODERM) 5 % Place 1 patch onto the skin every 12 (twelve) hours. Remove & Discard patch within 12 hours or as directed by MD 10/19/20 10/19/21  Blake Divine, MD  lidocaine-prilocaine (EMLA) cream APPLY TO ACCESS SITE 30 MINUTES PRIOR TO DIALYSIS 04/08/19   [provider]  midodrine (PROAMATINE) 10 MG tablet Take 10-20 mg by mouth daily. (for low BP associated with dialysis)    [provider]  oxyCODONE-acetaminophen (PERCOCET/ROXICET) 5-325 MG tablet Take 1 tablet by mouth 2 (two) times daily as needed for moderate pain.  12/08/18   [provider]  pantoprazole (PROTONIX) 40 MG tablet Take 40 mg by mouth daily.    [provider]  ranolazine (RANEXA) 500 MG 12 hr tablet Take 500 mg by mouth 2 (two) times daily. 02/14/20   [provider]  rosuvastatin (CRESTOR) 20 MG tablet Take 20 mg by mouth at bedtime. 09/02/20   [provider]  sacubitril-valsartan (ENTRESTO) 24-26 MG Per instructions TWICE DAILY  (route: oral) 11/05/19   [provider]  sevelamer carbonate (RENVELA) 800 MG tablet Take 2,400 mg by mouth 3 (three) times daily with meals. And 1 tablet with snacks    [provider]  ZOFRAN 4 MG tablet Take 4 mg by mouth every 4 (four) hours as needed for nausea/vomiting. 05/20/20   [provider]    Allergies Morphine and related, Levaquin [levofloxacin], Other, Latex, and Tape  Family History  Problem Relation Age of Onset  . Hypertension Other   . Cancer Other   . Renal Disease Other   . Ovarian cancer Mother   . Kidney disease Father   . Breast cancer Maternal Aunt   . Breast cancer Maternal Grandmother     Social History Social History  Tobacco Use  . Smoking status: Some Days    Packs/day: 0.10    Years: 20.00    Pack years: 2.00    Types: Cigarettes  . Smokeless tobacco: Never  . Tobacco comments:    smokes one a day  Vaping Use  . Vaping Use: Never used  Substance Use Topics  . Alcohol use: No  . Drug use: No    Review of Systems  Constitutional: No fever/chills Eyes: No visual changes. ENT: No sore throat. Cardiovascular: Denies chest pain. Respiratory: Denies shortness of breath. Gastrointestinal: Positive for abdominal pain, nausea and vomiting.  No diarrhea.  No constipation. Genitourinary: Negative for dysuria. Musculoskeletal: Negative for back pain. Skin: Negative for rash. Neurological: Negative for headaches, focal weakness or numbness.   ____________________________________________   PHYSICAL EXAM:  VITAL  SIGNS: ED Triage Vitals  Enc Vitals Group     BP 01/31/21 2127 96/67     Pulse Rate 01/31/21 2127 94     Resp 01/31/21 2127 18     Temp 01/31/21 2127 98.6 F (37 C)     Temp Source 01/31/21 2127 Oral     SpO2 01/31/21 2127 95 %     Weight 01/31/21 2127 160 lb (72.6 kg)     Height 01/31/21 2127 5\' 6"  (1.676 m)     Head Circumference --      Peak Flow --      Pain Score 01/31/21 2126 10     Pain Loc --      Pain Edu? --      Excl. in [place]? --     Constitutional: Alert and oriented.  Chronically ill appearing and in moderate acute distress. Eyes: Conjunctivae are normal. PERRL. EOMI. Head: Atraumatic. Nose: No congestion/rhinnorhea. Mouth/Throat: Mucous membranes are mildly dry. Neck: No stridor.   Cardiovascular: Normal rate, regular rhythm. Grossly normal heart sounds.  Good peripheral circulation. Respiratory: Normal respiratory effort.  No retractions. Lungs CTAB. Gastrointestinal: Soft with mild diffuse tenderness to palpation, maximally and right lower quadrant without rebound or guarding. No distention. No abdominal bruits. No CVA tenderness. Musculoskeletal: No lower extremity tenderness nor edema.  No joint effusions. Neurologic:  Normal speech and language. No gross focal neurologic deficits are appreciated. No gait instability. Skin:  Skin is warm, dry and intact. No rash noted. Psychiatric: Mood and affect are normal. Speech and behavior are normal.  ____________________________________________   LABS (all labs ordered are listed, but only abnormal results are displayed)  Labs Reviewed  BASIC METABOLIC PANEL - Abnormal; Notable for the following components:      Result Value   Potassium 5.6 (*)    Chloride 96 (*)    BUN 36 (*)    Creatinine, Ser 6.29 (*)    GFR, Estimated 7 (*)    All other components within normal limits  CBC - Abnormal; Notable for the following components:   Hemoglobin 16.3 (*)    HCT 48.0 (*)    MCV 103.7 (*)    MCH 35.2 (*)     Platelets 118 (*)    All other components within normal limits  TROPONIN I (HIGH SENSITIVITY) - Abnormal; Notable for the following components:   Troponin I (High Sensitivity) 66 (*)    All other components within normal limits  CULTURE, BLOOD (ROUTINE X 2)  CULTURE, BLOOD (ROUTINE X 2)  LACTIC ACID, PLASMA  LACTIC ACID, PLASMA  CBG MONITORING, ED   ____________________________________________  EKG  ED ECG REPORT I, King Pinzon J, the attending  physician, personally viewed and interpreted this ECG.   Date: 02/01/2021  EKG Time: 2137  Rate: 90  Rhythm: normal sinus rhythm  Axis: LAD  Intervals:left anterior fascicular block  ST&T Change: Nonspecific  ____________________________________________  RADIOLOGY Cecilie Kicks J, personally viewed and evaluated these images (plain radiographs) as part of my medical decision making, as well as reviewing the written report by the radiologist.  ED MD interpretation: Colitis, cannot rule out mass; cholelithiasis; chest x-ray with cardiomegaly with mild vascular congestion  Official radiology report(s): CT Abdomen Pelvis W Contrast  Result Date: 02/01/2021 CLINICAL DATA:  Nausea, vomiting, right lower quadrant abdominal pain EXAM: CT ABDOMEN AND PELVIS WITH CONTRAST TECHNIQUE: Multidetector CT imaging of the abdomen and pelvis was performed using the standard protocol following bolus administration of intravenous contrast. CONTRAST:  75 cc Omnipaque 350 COMPARISON:  None. FINDINGS: Lower chest: Mild bibasilar atelectasis. Moderate cardiomegaly. Extensive coronary artery calcification Hepatobiliary: Cholelithiasis without pericholecystic inflammatory change. No intra or extrahepatic biliary ductal dilation. Liver unremarkable. Pancreas: Unremarkable Spleen: Unremarkable Adrenals/Urinary Tract: The adrenal glands are unremarkable. Marked bilateral renal cortical atrophy is present in keeping with changes of chronic renal failure. The bladder is  decompressed and is unremarkable. Stomach/Bowel: The stomach and small bowel are unremarkable. There is mild circumferential bowel wall thickening involving the cecum with mild pericolonic soft tissue infiltration noted, best seen on image # 57/2. There is mild infiltration of the adjacent omental fat. In the acute setting, this may represent a focal infectious or inflammatory colitis with adjacent inflammatory change. Alternatively, this may represent a a focal mural mass with transmural infiltration. The terminal is unremarkable. The adjacent appendix is well visualized and is normal. There is no evidence of obstruction or perforation. No free intraperitoneal gas or fluid Vascular/Lymphatic: There is extensive aortoiliac atherosclerotic calcification. No aortic aneurysm. No pathologic adenopathy within the abdomen and pelvis. Reproductive: Multiple calcified uterine fibroids are again identified. The pelvic organs are otherwise unremarkable. Other: Tiny fat containing umbilical hernia.  Rectum unremarkable. Musculoskeletal: No acute bone abnormality. No lytic or blastic bone lesions. Diffuse sclerosis of the osseous structures is in keeping with renal osteodystrophy. IMPRESSION: Mild focal cecal bowel wall thickening and pericolonic soft tissue infiltration. In the acute setting, this may represent a focal infectious or inflammatory colitis in adjacent inflammatory change. However, an infiltrative mural mass could appear similarly with pericolonic changes representing transmural extension of disease. Correlation with recent screening endoscopy is recommended. No evidence of obstruction or focal inflammation. Cholelithiasis. Changes in keeping with chronic renal failure. In this setting, diffuse osseous sclerosis may reflect changes of renal osteodystrophy. Extensive coronary artery calcification.  Moderate cardiomegaly. Aortic Atherosclerosis (ICD10-I70.0). Electronically Signed   By: Fidela Salisbury M.D.   On:  02/01/2021 03:39   DG Chest Port 1 View  Result Date: 02/01/2021 CLINICAL DATA:  Chest pain. EXAM: PORTABLE CHEST 1 VIEW COMPARISON:  Chest radiograph dated 09/30/2020. FINDINGS: Cardiomegaly with mild vascular congestion. No focal consolidation, pleural effusion, or pneumothorax. Median sternotomy wires and coronary stent. No acute osseous pathology. Vascular stent in the left upper extremity. IMPRESSION: Cardiomegaly with mild vascular congestion. No focal consolidation. Electronically Signed   By: Anner Crete M.D.   On: 02/01/2021 02:41    ____________________________________________   PROCEDURES  Procedure(s) performed (including Critical Care):  .1-3 Lead EKG Interpretation  Date/Time: 02/01/2021 3:59 AM Performed by: Paulette Blanch, MD Authorized by: Paulette Blanch, MD     Interpretation: normal     ECG rate:  70  ECG rate assessment: normal     Rhythm: sinus rhythm     Ectopy: none     Conduction: normal   Comments:     Patient placed on cardiac monitor to evaluate for arrhythmias  CRITICAL CARE Performed by: Paulette Blanch   Total critical care time: 30 minutes  Critical care time was exclusive of separately billable procedures and treating other patients.  Critical care was necessary to treat or prevent imminent or life-threatening deterioration.  Critical care was time spent personally by me on the following activities: development of treatment plan with patient and/or surrogate as well as nursing, discussions with consultants, evaluation of patient's response to treatment, examination of patient, obtaining history from patient or surrogate, ordering and performing treatments and interventions, ordering and review of laboratory studies, ordering and review of radiographic studies, pulse oximetry and re-evaluation of patient's condition.    ____________________________________________   INITIAL IMPRESSION / ASSESSMENT AND PLAN / ED COURSE  As part of my medical  decision making, I reviewed the following data within the [facility] notes reviewed and incorporated, Labs reviewed, EKG interpreted, Old chart reviewed, Radiograph reviewed, Discussed with admitting physician, and Notes from prior ED visits     60 year old female on dialysis presenting with abdominal pain, nausea and vomiting. Differential diagnosis includes, but is not limited to, ovarian cyst, ovarian torsion, acute appendicitis, diverticulitis, urinary tract infection/pyelonephritis, endometriosis, bowel obstruction, colitis, renal colic, gastroenteritis, hernia, fibroids, endometriosis, etc.   Laboratory results demonstrate renal disease, mildly elevated troponin likely secondary to renal disease, normal lactic acid.  Will obtain chest x-ray, CT abdomen/pelvis.  Administer judicious IV fluids for borderline hypotension.  Clinical Course as of 02/01/21 0401  Sat Feb 01, 2021  0400 Blood pressure improved.  Updated patient on CT imaging results.  Will start IV Zosyn.  Will discuss with hospitalist services for admission. [JS]    Clinical Course User Index [JS] Paulette Blanch, MD     ____________________________________________   FINAL CLINICAL IMPRESSION(S) / ED DIAGNOSES  Final diagnoses:  Generalized weakness  Generalized abdominal pain  Non-intractable vomiting with nausea, unspecified vomiting type  Colitis     ED Discharge Orders     None        Note:  This document was prepared using Dragon voice recognition software and may include unintentional dictation errors.    Paulette Blanch, MD 02/01/21 [phone]

## 2021-02-01 NOTE — Consult Note (Signed)
[name] , MD [address] [place], [place], [place], [address] [address] [place], [place], [place], [address] Phone: [phone]  Fax: [phone]  Consultation  Referring Provider:     \Dr. Damita Dunnings Primary Care Physician:  [name], MD Primary Gastroenterologist: [facility] gastroenterology         Reason for Consultation:     Acute colitis  Date of Admission:  02/01/2021 Date of Consultation:  02/01/2021         HPI:   [name] is a 60 y.o. female who has previously been seen and evaluated by [facility] gastroenterology last in July 2020 for cirrhosis of the liver, Cologuard positive test.  History of hepatitis C virus cirrhosis and treated with antivirals at [facility] in 2019.  And achieved SVR.  EGD in 2014 showed LA grade C esophagitis.  Review of epic shows only a flexible sigmoidoscopy performed in 03/05/2019 and a 2 mm polyp was found in the rectum that were sessile and resected.  Plan is to return to GI clinic in 3 weeks.CT colonography in July 2020 showed a 14 mm flat polypoid lesion in the right lateral aspect of the upper mid rectum 10 cm from the anal verge.   This admission she was admitted early this morning with abdominal pain.  Medical problems include end-stage renal disease on dialysis, atrial fibrillation on Eliquis, CAD status post CABG.  She underwent a CT scan of the abdomen which showed pericolonic soft tissue infiltration which may represent infectious or inflammatory colitis.  Infiltrative mural mass could appear similarly.  Admitted.  Treated with IV Zosyn. She denies any NSAID use or diarrhea, had a normal bowel movement this morning , says the generalized abdominal pain began 1-2 days back followed by nausea and vomiting which has since resolved.  Hemoglobin admission 16.3 g with MCV 103 and a platelet count of 118, creatinine 6.29 potassium of 5.6  Past Medical History:  Diagnosis Date  . Anemia    chronic disease  . CHF (congestive heart failure) ([place])    . Coronary artery disease   . Heart failure ([place])   . Hepatitis    history of hep c  . Hyperlipidemia   . Hypertension   . Myocardial infarction ([place])   . Paroxysmal atrial fibrillation ([facility])   . Renal failure   . Renal insufficiency   . Stroke [facility]) 2011    Past Surgical History:  Procedure Laterality Date  . A/V FISTULAGRAM Left 03/30/2019   Procedure: A/V FISTULAGRAM;  Surgeon: Algernon Huxley, MD;  Location: [place] [facility];  Service: Cardiovascular;  Laterality: Left;  . A/V FISTULAGRAM Left 10/06/2019   Procedure: A/V FISTULAGRAM;  Surgeon: Algernon Huxley, MD;  Location: [place] [facility];  Service: Cardiovascular;  Laterality: Left;  . CORONARY ANGIOPLASTY WITH STENT PLACEMENT  2013  . CORONARY ARTERY BYPASS GRAFT    . DIALYSIS FISTULA CREATION    . FLEXIBLE SIGMOIDOSCOPY N/A 02/23/2019   Procedure: FLEXIBLE SIGMOIDOSCOPY;  Surgeon: Lollie Sails, MD;  Location: [facility] ENDOSCOPY;  Service: Endoscopy;  Laterality: N/A;  . LEFT HEART CATH AND CORS/GRAFTS ANGIOGRAPHY N/A 08/08/2018   Procedure: LEFT HEART CATH AND CORS/GRAFTS ANGIOGRAPHY;  Surgeon: Dionisio David, MD;  Location: [place] [facility];  Service: Cardiovascular;  Laterality: N/A;  . LEFT HEART CATH AND CORS/GRAFTS ANGIOGRAPHY N/A 01/30/2019   Procedure: LEFT HEART CATH AND CORS/GRAFTS ANGIOGRAPHY;  Surgeon: Dionisio David, MD;  Location: [place] [facility];  Service: Cardiovascular;  Laterality: N/A;    Prior to Admission medications   Medication Sig Start Date End Date Taking? Authorizing Provider  amiodarone (PACERONE) 200 MG tablet Take 200 mg by mouth daily. 07/30/20  Yes [provider]  apixaban (ELIQUIS) 5 MG TABS tablet Per instructions TWICE DAILY  (route: oral) 09/11/19  Yes [provider]  carvedilol (COREG) 3.125 MG tablet Take 3.125 mg by mouth 2 (two) times daily. 10/14/20  Yes [provider]  cinacalcet (SENSIPAR) 90 MG tablet Take 90 mg by mouth daily.   Yes  [provider]  hydrOXYzine (ATARAX/VISTARIL) 50 MG tablet Take 50 mg by mouth 2 (two) times daily as needed for itching.    Yes [provider]  levothyroxine (SYNTHROID) 75 MCG tablet Take 75 mcg by mouth daily before breakfast.  11/02/18  Yes [provider]  lidocaine-prilocaine (EMLA) cream APPLY TO ACCESS SITE 30 MINUTES PRIOR TO DIALYSIS 04/08/19  Yes [provider]  midodrine (PROAMATINE) 10 MG tablet Take 10-20 mg by mouth daily. (for low BP associated with dialysis)   Yes [provider]  oxyCODONE-acetaminophen (PERCOCET/ROXICET) 5-325 MG tablet Take 1 tablet by mouth 2 (two) times daily as needed for moderate pain.  12/08/18  Yes [provider]  pantoprazole (PROTONIX) 40 MG tablet Take 40 mg by mouth daily.   Yes [provider]  ranolazine (RANEXA) 500 MG 12 hr tablet Take 500 mg by mouth 2 (two) times daily. 02/14/20  Yes [provider]  rosuvastatin (CRESTOR) 20 MG tablet Take 20 mg by mouth at bedtime. 09/02/20  Yes [provider]  sevelamer carbonate (RENVELA) 800 MG tablet Take 2,400 mg by mouth 3 (three) times daily with meals. And 1 tablet with snacks   Yes [provider]  VELTASSA 8.4 g packet Take 1 packet by mouth daily. 01/27/21  Yes [provider]  carvedilol (COREG) 6.25 MG tablet Take 6.25 mg by mouth 2 (two) times daily. Patient not taking: Reported on 02/01/2021 08/13/20   [provider]  lidocaine (LIDODERM) 5 % Place 1 patch onto the skin every 12 (twelve) hours. Remove & Discard patch within 12 hours or as directed by MD Patient not taking: No sig reported 10/19/20 10/19/21  Blake Divine, MD  sacubitril-valsartan (ENTRESTO) 24-26 MG Per instructions TWICE DAILY  (route: oral) Patient not taking: Reported on 02/01/2021 11/05/19   [provider]  ZOFRAN 4 MG tablet Take 4 mg by mouth every 4 (four) hours as needed for nausea/vomiting. 05/20/20   [provider]    Family History  Problem Relation Age of Onset  . Hypertension Other   . Cancer Other   . Renal Disease Other   . Ovarian cancer Mother   . Kidney disease Father   . Breast cancer Maternal Aunt   . Breast cancer Maternal Grandmother      Social History   Tobacco Use  . Smoking status: Some Days    Packs/day: 0.10    Years: 20.00    Pack years: 2.00    Types: Cigarettes  . Smokeless tobacco: Never  . Tobacco comments:    smokes one a day  Vaping Use  . Vaping Use: Never used  Substance Use Topics  . Alcohol use: No  . Drug use: No    Allergies as of 01/31/2021 - Review Complete 01/31/2021  Allergen Reaction Noted  . Morphine and related Hives 05/29/2013  . Levaquin [levofloxacin] Itching 04/21/2018  . Other  04/18/2020  . Latex Rash 05/29/2013  . Tape Rash 12/04/2013    Review of Systems:    All systems reviewed and negative except where noted in HPI.   Physical Exam:  Vital signs in last 24 hours: Temp:  [98.6 F (37 C)] 98.6 F (37 C) (08/19 2127) Pulse Rate:  [63-94] 79 (08/20 0900) Resp:  [12-26] 17 (08/20 0900) BP: (79-107)/(53-74) 107/72 (08/20 0900) SpO2:  [82 %-99 %] 90 % (08/20 0900) Weight:  [72.6 kg] 72.6 kg (08/19 2127)   General:   Pleasant, cooperative in NAD Head:  Normocephalic and atraumatic. Eyes:   No icterus.   Conjunctiva pink. PERRLA. Ears:  Normal auditory acuity. Neck:  Supple; no masses or thyroidomegaly Lungs: Respirations even and unlabored. Lungs clear to auscultation bilaterally.   No wheezes, crackles, or rhonchi.  Heart:  Regular rate and rhythm;  Without murmur, clicks, rubs or gallops Abdomen:  Soft, nondistended, mild generalized tenderness no guarding or rigidity. BS+  Neurologic:  Alert and oriented x3;  grossly normal neurologically. Skin:  Intact without significant lesions or rashes. Cervical Nodes:  No significant cervical adenopathy. Psych:  Alert and cooperative. Normal affect.  LAB  RESULTS: Recent Labs    01/31/21 2139  WBC 7.5  HGB 16.3*  HCT 48.0*  PLT 118*   BMET Recent Labs    01/31/21 2139  NA 136  K 5.6*  CL 96*  CO2 27  GLUCOSE 90  BUN 36*  CREATININE 6.29*  CALCIUM 9.2   LFT No results for input(s): PROT, ALBUMIN, AST, ALT, ALKPHOS, BILITOT, BILIDIR, IBILI in the last 72 hours. PT/INR No results for input(s): LABPROT, INR in the last 72 hours.  STUDIES: CT Abdomen Pelvis W Contrast  Result Date: 02/01/2021 CLINICAL DATA:  Nausea, vomiting, right lower quadrant abdominal pain EXAM: CT ABDOMEN AND PELVIS WITH CONTRAST TECHNIQUE: Multidetector CT imaging of the abdomen and pelvis was performed using the standard protocol following bolus administration of intravenous contrast. CONTRAST:  75 cc Omnipaque 350 COMPARISON:  None. FINDINGS: Lower chest: Mild bibasilar atelectasis. Moderate cardiomegaly. Extensive coronary artery calcification Hepatobiliary: Cholelithiasis without pericholecystic inflammatory change. No intra or extrahepatic biliary ductal dilation. Liver unremarkable. Pancreas: Unremarkable Spleen: Unremarkable Adrenals/Urinary Tract: The adrenal glands are unremarkable. Marked bilateral renal cortical atrophy is present in keeping with changes of chronic renal failure. The bladder is decompressed and is unremarkable. Stomach/Bowel: The stomach and small bowel are unremarkable. There is mild circumferential bowel wall thickening involving the cecum with mild pericolonic soft tissue infiltration noted, best seen on image # 57/2. There is mild infiltration of the adjacent omental fat. In the acute setting, this may represent a focal infectious or inflammatory colitis with adjacent inflammatory change. Alternatively, this may represent a a focal mural mass with transmural infiltration. The terminal is unremarkable. The adjacent appendix is well visualized and is normal. There is no evidence of obstruction or perforation. No free intraperitoneal gas or  fluid Vascular/Lymphatic: There is extensive aortoiliac atherosclerotic calcification. No aortic aneurysm. No pathologic adenopathy within the abdomen and pelvis. Reproductive: Multiple calcified uterine fibroids are again identified. The pelvic organs are otherwise unremarkable. Other: Tiny fat containing umbilical hernia.  Rectum unremarkable. Musculoskeletal: No acute bone abnormality. No lytic or blastic bone lesions. Diffuse sclerosis of the osseous structures is in keeping with renal osteodystrophy. IMPRESSION: Mild focal cecal bowel wall thickening and pericolonic soft tissue infiltration. In the acute setting, this may represent a focal infectious or inflammatory colitis in adjacent inflammatory change. However, an infiltrative  mural mass could appear similarly with pericolonic changes representing transmural extension of disease. Correlation with recent screening endoscopy is recommended. No evidence of obstruction or focal inflammation. Cholelithiasis. Changes in keeping with chronic renal failure. In this setting, diffuse osseous sclerosis may reflect changes of renal osteodystrophy. Extensive coronary artery calcification.  Moderate cardiomegaly. Aortic Atherosclerosis (ICD10-I70.0). Electronically Signed   By: Fidela Salisbury M.D.   On: 02/01/2021 03:39   DG Chest Port 1 View  Result Date: 02/01/2021 CLINICAL DATA:  Chest pain. EXAM: PORTABLE CHEST 1 VIEW COMPARISON:  Chest radiograph dated 09/30/2020. FINDINGS: Cardiomegaly with mild vascular congestion. No focal consolidation, pleural effusion, or pneumothorax. Median sternotomy wires and coronary stent. No acute osseous pathology. Vascular stent in the left upper extremity. IMPRESSION: Cardiomegaly with mild vascular congestion. No focal consolidation. Electronically Signed   By: Anner Crete M.D.   On: 02/01/2021 02:41      Impression / Plan:   [name] is a 60 y.o. y/o female presents to the emergency room with abdominal pain  and CT scan findings of possible acute colitis multiple mass in the cecum.  She has been started on antibiotics.  No stool studies have been obtained since she has no diarrhea. Likely an infectious cause which shold resolve usually with conservative management  Plan 1.  Get stool studies if has diarrhea   2.  If  pain resolves then outpatient colonoscopy within 2-4 weeks.   She follows with [facility] gastroenterology and will need an appointment with them after discharge.  3. Serial abdominal exam and if pain or tenderness worsens get repeat imaging.     Thank you for involving me in the care of this patient.      LOS: 0 days   Vanessa Bellows, MD  02/01/2021, 9:44 AM

## 2021-02-01 NOTE — H&P (Signed)
History and Physical    Edye Hainline Daughtrey ELF:[id number] DOB: 1960/09/18 DOA: 02/01/2021  PCP: [name], MD   Patient coming from: home  I have personally briefly reviewed patient's old medical records in [place]  Chief Complaint: abdominal pain  HPI: MONEA PESANTEZ is a 60 y.o. female with medical history significant for ESRD on HD MWF, paroxysmal A. fib on Eliquis, HFrEF, CAD s/p CABG, who was sent from dialysis, with a complaint of generalized abdominal pain that started while at dialysis the day prior.  The pain is of severe intensity in the mid abdomen radiating to the flanks and up to epigastrium and associated with nausea and vomiting that is nonbloody and nonbilious.  She denies diarrhea.  Denies cough, fever, chills, shortness of breath.  ED course: On arrival, afebrile, BP 96/67 with pulse of 94 and O2 sat 95% on room air Blood work with normal WBC, hemoglobin 16, potassium 5.6, troponin 66, lactic acid 1.6.  Lipase not done  EKG, personally viewed and interpreted: NSR at 90 with nonspecific ST-T wave changes  Imaging: CT abdomen and pelvis with contrast showing pericolonic soft tissue infiltration which may represent focal infectious or inflammatory colitis. And infiltrative mural mass could appear similarly Chest x-ray: Mild vascular congestion with no focal consolidation  Patient given an IV fluid bolus started on Zosyn.  Hospitalist consulted for admission.  Review of Systems: As per HPI otherwise all other systems on review of systems negative.    Past Medical History:  Diagnosis Date   Anemia    chronic disease   CHF (congestive heart failure) (HCC)    Coronary artery disease    Heart failure (HCC)    Hepatitis    history of hep c   Hyperlipidemia    Hypertension    Myocardial infarction [facility])    Paroxysmal atrial fibrillation ([place])    Renal failure    Renal insufficiency    Stroke [facility]) 2011    Past Surgical History:  Procedure  Laterality Date   A/V FISTULAGRAM Left 03/30/2019   Procedure: A/V FISTULAGRAM;  Surgeon: Algernon Huxley, MD;  Location: [place] [facility];  Service: Cardiovascular;  Laterality: Left;   A/V FISTULAGRAM Left 10/06/2019   Procedure: A/V FISTULAGRAM;  Surgeon: Algernon Huxley, MD;  Location: [place] [facility];  Service: Cardiovascular;  Laterality: Left;   CORONARY ANGIOPLASTY WITH STENT PLACEMENT  2013   CORONARY ARTERY BYPASS GRAFT     DIALYSIS FISTULA CREATION     FLEXIBLE SIGMOIDOSCOPY N/A 02/23/2019   Procedure: FLEXIBLE SIGMOIDOSCOPY;  Surgeon: Lollie Sails, MD;  Location: [facility];  Service: Endoscopy;  Laterality: N/A;   LEFT HEART CATH AND CORS/GRAFTS ANGIOGRAPHY N/A 08/08/2018   Procedure: LEFT HEART CATH AND CORS/GRAFTS ANGIOGRAPHY;  Surgeon: Dionisio David, MD;  Location: [place] [facility];  Service: Cardiovascular;  Laterality: N/A;   LEFT HEART CATH AND CORS/GRAFTS ANGIOGRAPHY N/A 01/30/2019   Procedure: LEFT HEART CATH AND CORS/GRAFTS ANGIOGRAPHY;  Surgeon: Dionisio David, MD;  Location: [place] [facility];  Service: Cardiovascular;  Laterality: N/A;     reports that she has been smoking cigarettes. She has a 2.00 pack-year smoking history. She has never used smokeless tobacco. She reports that she does not drink alcohol and does not use drugs.  Allergies  Allergen Reactions   Morphine And Related Hives   Levaquin [Levofloxacin] Itching    Severe itching; prickly sensation   Other    Latex Rash  Tape Rash    Family History  Problem Relation Age of Onset   Hypertension Other    Cancer Other    Renal Disease Other    Ovarian cancer Mother    Kidney disease Father    Breast cancer Maternal Aunt    Breast cancer Maternal Grandmother       Prior to Admission medications   Medication Sig Start Date End Date Taking? Authorizing Provider  amiodarone (PACERONE) 200 MG tablet Take 200 mg by mouth daily. 07/30/20   [provider]  apixaban  (ELIQUIS) 5 MG TABS tablet Per instructions TWICE DAILY  (route: oral) 09/11/19   [provider]  carvedilol (COREG) 6.25 MG tablet Take 6.25 mg by mouth 2 (two) times daily. 08/13/20   [provider]  cinacalcet (SENSIPAR) 90 MG tablet Take 90 mg by mouth daily.    [provider]  hydrOXYzine (ATARAX/VISTARIL) 50 MG tablet Take 50 mg by mouth 2 (two) times daily as needed for itching.     [provider]  levothyroxine (SYNTHROID) 75 MCG tablet Take 75 mcg by mouth daily before breakfast.  11/02/18   [provider]  lidocaine (LIDODERM) 5 % Place 1 patch onto the skin every 12 (twelve) hours. Remove & Discard patch within 12 hours or as directed by MD 10/19/20 10/19/21  Blake Divine, MD  lidocaine-prilocaine (EMLA) cream APPLY TO ACCESS SITE 30 MINUTES PRIOR TO DIALYSIS 04/08/19   [provider]  midodrine (PROAMATINE) 10 MG tablet Take 10-20 mg by mouth daily. (for low BP associated with dialysis)    [provider]  oxyCODONE-acetaminophen (PERCOCET/ROXICET) 5-325 MG tablet Take 1 tablet by mouth 2 (two) times daily as needed for moderate pain.  12/08/18   [provider]  pantoprazole (PROTONIX) 40 MG tablet Take 40 mg by mouth daily.    [provider]  ranolazine (RANEXA) 500 MG 12 hr tablet Take 500 mg by mouth 2 (two) times daily. 02/14/20   [provider]  rosuvastatin (CRESTOR) 20 MG tablet Take 20 mg by mouth at bedtime. 09/02/20   [provider]  sacubitril-valsartan (ENTRESTO) 24-26 MG Per instructions TWICE DAILY  (route: oral) 11/05/19   [provider]  sevelamer carbonate (RENVELA) 800 MG tablet Take 2,400 mg by mouth 3 (three) times daily with meals. And 1 tablet with snacks    [provider]  ZOFRAN 4 MG tablet Take 4 mg by mouth every 4 (four) hours as needed for nausea/vomiting. 05/20/20   [provider]    Physical Exam: Vitals:   02/01/21 0245 02/01/21  0315 02/01/21 0330 02/01/21 0345  BP:      Pulse: 73  73 71  Resp: 15 13 12 14   Temp:      TempSrc:      SpO2: 99%  99% 92%  Weight:      Height:         Vitals:   02/01/21 0245 02/01/21 0315 02/01/21 0330 02/01/21 0345  BP:      Pulse: 73  73 71  Resp: 15 13 12 14   Temp:      TempSrc:      SpO2: 99%  99% 92%  Weight:      Height:          Constitutional: Alert and oriented x 3 .  Appears to be in mild pain discomfort  HEENT:      Head: Normocephalic and atraumatic.  Eyes: PERLA, EOMI, Conjunctivae are normal. Sclera is non-icteric.       Mouth/Throat: Mucous membranes are moist.       Neck: Supple with no signs of meningismus. Cardiovascular: Regular rate and rhythm. No murmurs, gallops, or rubs. 2+ symmetrical distal pulses are present . No JVD. No LE edema Respiratory: Respiratory effort normal .Lungs sounds clear bilaterally. No wheezes, crackles, or rhonchi.  Gastrointestinal: Soft, tender periumbilical, non distended with positive bowel sounds.  Genitourinary: No CVA tenderness. Musculoskeletal: Nontender with normal range of motion in all extremities. No cyanosis, or erythema of extremities. Neurologic:  Face is symmetric. Moving all extremities. No gross focal neurologic deficits . Skin: Skin is warm, dry.  No rash or ulcers Psychiatric: Mood and affect are normal    Labs on Admission: I have personally reviewed following labs and imaging studies  CBC: Recent Labs  Lab 01/31/21 2139  WBC 7.5  HGB 16.3*  HCT 48.0*  MCV 103.7*  PLT 413*   Basic Metabolic Panel: Recent Labs  Lab 01/31/21 2139  NA 136  K 5.6*  CL 96*  CO2 27  GLUCOSE 90  BUN 36*  CREATININE 6.29*  CALCIUM 9.2   GFR: Estimated Creatinine Clearance: 9.9 mL/min (A) (by C-G formula based on SCr of 6.29 mg/dL (H)). Liver Function Tests: No results for input(s): AST, ALT, ALKPHOS, BILITOT, PROT, ALBUMIN in the last 168 hours. No results for input(s): LIPASE, AMYLASE in the  last 168 hours. No results for input(s): AMMONIA in the last 168 hours. Coagulation Profile: No results for input(s): INR, PROTIME in the last 168 hours. Cardiac Enzymes: No results for input(s): CKTOTAL, CKMB, CKMBINDEX, TROPONINI in the last 168 hours. BNP (last 3 results) No results for input(s): PROBNP in the last 8760 hours. HbA1C: No results for input(s): HGBA1C in the last 72 hours. CBG: No results for input(s): GLUCAP in the last 168 hours. Lipid Profile: No results for input(s): CHOL, HDL, LDLCALC, TRIG, CHOLHDL, LDLDIRECT in the last 72 hours. Thyroid Function Tests: No results for input(s): TSH, T4TOTAL, FREET4, T3FREE, THYROIDAB in the last 72 hours. Anemia Panel: No results for input(s): VITAMINB12, FOLATE, FERRITIN, TIBC, IRON, RETICCTPCT in the last 72 hours. Urine analysis:    Component Value Date/Time   COLORURINE Yellow 09/14/2012 0414   APPEARANCEUR Cloudy 09/14/2012 0414   LABSPEC 1.018 09/14/2012 0414   PHURINE 9.0 09/14/2012 0414   GLUCOSEU 50 mg/dL 09/14/2012 0414   HGBUR 1+ 09/14/2012 0414   BILIRUBINUR Negative 09/14/2012 0414   KETONESUR Negative 09/14/2012 0414   PROTEINUR >=500 09/14/2012 0414   NITRITE Negative 09/14/2012 0414   LEUKOCYTESUR 2+ 09/14/2012 0414    Radiological Exams on Admission: CT Abdomen Pelvis W Contrast  Result Date: 02/01/2021 CLINICAL DATA:  Nausea, vomiting, right lower quadrant abdominal pain EXAM: CT ABDOMEN AND PELVIS WITH CONTRAST TECHNIQUE: Multidetector CT imaging of the abdomen and pelvis was performed using the standard protocol following bolus administration of intravenous contrast. CONTRAST:  75 cc Omnipaque 350 COMPARISON:  None. FINDINGS: Lower chest: Mild bibasilar atelectasis. Moderate cardiomegaly. Extensive coronary artery calcification Hepatobiliary: Cholelithiasis without pericholecystic inflammatory change. No intra or extrahepatic biliary ductal dilation. Liver unremarkable. Pancreas: Unremarkable Spleen:  Unremarkable Adrenals/Urinary Tract: The adrenal glands are unremarkable. Marked bilateral renal cortical atrophy is present in keeping with changes of chronic renal failure. The bladder is decompressed and is unremarkable. Stomach/Bowel: The stomach and small bowel are unremarkable. There is mild circumferential bowel wall thickening involving the cecum with mild pericolonic soft tissue infiltration  noted, best seen on image # 57/2. There is mild infiltration of the adjacent omental fat. In the acute setting, this may represent a focal infectious or inflammatory colitis with adjacent inflammatory change. Alternatively, this may represent a a focal mural mass with transmural infiltration. The terminal is unremarkable. The adjacent appendix is well visualized and is normal. There is no evidence of obstruction or perforation. No free intraperitoneal gas or fluid Vascular/Lymphatic: There is extensive aortoiliac atherosclerotic calcification. No aortic aneurysm. No pathologic adenopathy within the abdomen and pelvis. Reproductive: Multiple calcified uterine fibroids are again identified. The pelvic organs are otherwise unremarkable. Other: Tiny fat containing umbilical hernia.  Rectum unremarkable. Musculoskeletal: No acute bone abnormality. No lytic or blastic bone lesions. Diffuse sclerosis of the osseous structures is in keeping with renal osteodystrophy. IMPRESSION: Mild focal cecal bowel wall thickening and pericolonic soft tissue infiltration. In the acute setting, this may represent a focal infectious or inflammatory colitis in adjacent inflammatory change. However, an infiltrative mural mass could appear similarly with pericolonic changes representing transmural extension of disease. Correlation with recent screening endoscopy is recommended. No evidence of obstruction or focal inflammation. Cholelithiasis. Changes in keeping with chronic renal failure. In this setting, diffuse osseous sclerosis may reflect  changes of renal osteodystrophy. Extensive coronary artery calcification.  Moderate cardiomegaly. Aortic Atherosclerosis (ICD10-I70.0). Electronically Signed   By: Fidela Salisbury M.D.   On: 02/01/2021 03:39   DG Chest Port 1 View  Result Date: 02/01/2021 CLINICAL DATA:  Chest pain. EXAM: PORTABLE CHEST 1 VIEW COMPARISON:  Chest radiograph dated 09/30/2020. FINDINGS: Cardiomegaly with mild vascular congestion. No focal consolidation, pleural effusion, or pneumothorax. Median sternotomy wires and coronary stent. No acute osseous pathology. Vascular stent in the left upper extremity. IMPRESSION: Cardiomegaly with mild vascular congestion. No focal consolidation. Electronically Signed   By: Anner Crete M.D.   On: 02/01/2021 02:41     Assessment/Plan 60 year old female with history of ESRD on HD MWF, paroxysmal A. fib on Eliquis, HFrEF, CAD s/p CABG, presenting with generalized abdominal pain x1 day.      Acute colitis - Patient with abdominal pain with CT abdomen and pelvis showing possible colitis, possible infiltrative mural mass - Clear liquid diet - Pain control and supportive care - GI consult   CAD S/P CABG x 3 Elevated troponin - Slightly elevated troponin of 66 but EKG nonacute, suspect demand - Continue to trend - Continue carvedilol, Ranexa, Crestor    HFrEF (heart failure with reduced ejection fraction)  - Appears euvolemic - Continue Coreg.  Continue Entresto pending med rec    ESRD (end stage renal disease) on dialysis [facility]) - Nephrology consult for continuation of dialysis - Continue Renvela    Paroxysmal atrial fibrillation ([place]) - Continue amiodarone and Eliquis    DVT prophylaxis: Eliquis Code Status: full code  Family Communication:  none  Disposition Plan: Back to previous home environment Consults called: GI and nephrology Status:At the time of admission, it appears that the appropriate admission status for this patient is INPATIENT. This is judged to be  reasonable and necessary in order to provide the required intensity of service to ensure the patient's safety given the presenting symptoms, physical exam findings, and initial radiographic and laboratory data in the context of their  Comorbid conditions.   Patient requires inpatient status due to high intensity of service, high risk for further deterioration and high frequency of surveillance required.   I certify that at the point of admission it is my clinical judgment that  the patient will require inpatient hospital care spanning beyond [place] MD [facility]     02/01/2021, 4:37 AM

## 2021-02-02 DIAGNOSIS — R1084 Generalized abdominal pain: Secondary | ICD-10-CM | POA: Diagnosis not present

## 2021-02-02 LAB — GLUCOSE, CAPILLARY
Glucose-Capillary: 102 mg/dL — ABNORMAL HIGH (ref 70–99)
Glucose-Capillary: 125 mg/dL — ABNORMAL HIGH (ref 70–99)
Glucose-Capillary: 130 mg/dL — ABNORMAL HIGH (ref 70–99)
Glucose-Capillary: 142 mg/dL — ABNORMAL HIGH (ref 70–99)
Glucose-Capillary: 189 mg/dL — ABNORMAL HIGH (ref 70–99)
Glucose-Capillary: 27 mg/dL — CL (ref 70–99)
Glucose-Capillary: 72 mg/dL (ref 70–99)

## 2021-02-02 LAB — MAGNESIUM: Magnesium: 2.3 mg/dL (ref 1.7–2.4)

## 2021-02-02 LAB — BASIC METABOLIC PANEL
Anion gap: 13 (ref 5–15)
BUN: 59 mg/dL — ABNORMAL HIGH (ref 6–20)
CO2: 25 mmol/L (ref 22–32)
Calcium: 9.2 mg/dL (ref 8.9–10.3)
Chloride: 94 mmol/L — ABNORMAL LOW (ref 98–111)
Creatinine, Ser: 8.75 mg/dL — ABNORMAL HIGH (ref 0.44–1.00)
GFR, Estimated: 5 mL/min — ABNORMAL LOW (ref >60)
Glucose, Bld: 60 mg/dL — ABNORMAL LOW (ref 70–99)
Potassium: >7.5 mmol/L (ref 3.5–5.1)
Sodium: 132 mmol/L — ABNORMAL LOW (ref 135–145)

## 2021-02-02 LAB — HEPATITIS B SURFACE ANTIGEN: Hepatitis B Surface Ag: NONREACTIVE

## 2021-02-02 LAB — CBC
HCT: 44.2 % (ref 36.0–46.0)
Hemoglobin: 14.5 g/dL (ref 12.0–15.0)
MCH: 33.2 pg (ref 26.0–34.0)
MCHC: 32.8 g/dL (ref 30.0–36.0)
MCV: 101.1 fL — ABNORMAL HIGH (ref 80.0–100.0)
Platelets: 109 10*3/uL — ABNORMAL LOW (ref 150–400)
RBC: 4.37 MIL/uL (ref 3.87–5.11)
RDW: 14.4 % (ref 11.5–15.5)
WBC: 8.1 10*3/uL (ref 4.0–10.5)
nRBC: 0 % (ref 0.0–0.2)

## 2021-02-02 LAB — POTASSIUM: Potassium: 3.7 mmol/L (ref 3.5–5.1)

## 2021-02-02 LAB — MRSA NEXT GEN BY PCR, NASAL: MRSA by PCR Next Gen: DETECTED — AB

## 2021-02-02 MED ORDER — PATIROMER SORBITEX CALCIUM 8.4 G PO PACK
16.8000 g | PACK | Freq: Every day | ORAL | Status: DC
  Administered 2021-02-02: 16.8 g via ORAL
  Filled 2021-02-02 (×2): qty 2

## 2021-02-02 MED ORDER — SODIUM CHLORIDE 0.9 % IV SOLN: 100.0000 mL | INTRAVENOUS | Status: DC | PRN

## 2021-02-02 MED ORDER — LIDOCAINE-PRILOCAINE 2.5-2.5 % EX CREA
1.0000 "application " | TOPICAL_CREAM | CUTANEOUS | Status: DC | PRN
  Filled 2021-02-02: qty 5

## 2021-02-02 MED ORDER — MUPIROCIN 2 % EX OINT
1.0000 "application " | TOPICAL_OINTMENT | Freq: Two times a day (BID) | CUTANEOUS | Status: DC
  Administered 2021-02-02: 1 via NASAL
  Filled 2021-02-02: qty 22

## 2021-02-02 MED ORDER — DEXTROSE 50 % IV SOLN
1.0000 | Freq: Once | INTRAVENOUS | Status: AC
  Administered 2021-02-02: 50 mL via INTRAVENOUS
  Filled 2021-02-02: qty 50

## 2021-02-02 MED ORDER — CHLORHEXIDINE GLUCONATE CLOTH 2 % EX PADS
6.0000 | MEDICATED_PAD | Freq: Every day | CUTANEOUS | Status: DC
  Administered 2021-02-03: 6 via TOPICAL

## 2021-02-02 MED ORDER — LIDOCAINE HCL URETHRAL/MUCOSAL 2 % EX GEL
1.0000 "application " | Freq: Once | CUTANEOUS | Status: AC
  Administered 2021-02-02: 1 via TOPICAL
  Filled 2021-02-02: qty 5

## 2021-02-02 MED ORDER — PATIROMER SORBITEX CALCIUM 8.4 G PO PACK
16.8000 g | PACK | Freq: Every day | ORAL | Status: DC
  Filled 2021-02-02: qty 2

## 2021-02-02 MED ORDER — DEXTROSE 50 % IV SOLN
INTRAVENOUS | Status: AC
  Administered 2021-02-02: 50 mL
  Filled 2021-02-02: qty 50

## 2021-02-02 MED ORDER — SODIUM BICARBONATE 8.4 % IV SOLN
50.0000 meq | Freq: Once | INTRAVENOUS | Status: AC
  Administered 2021-02-02: 50 meq via INTRAVENOUS
  Filled 2021-02-02: qty 50

## 2021-02-02 MED ORDER — HEPARIN SODIUM (PORCINE) 1000 UNIT/ML DIALYSIS: 1000.0000 [IU] | INTRAMUSCULAR | Status: DC | PRN

## 2021-02-02 MED ORDER — INSULIN ASPART 100 UNIT/ML IV SOLN
10.0000 [IU] | Freq: Once | INTRAVENOUS | Status: AC
  Administered 2021-02-02: 10 [IU] via INTRAVENOUS
  Filled 2021-02-02: qty 0.1

## 2021-02-02 NOTE — Progress Notes (Signed)
Hypoglycemic Event  CBG: 27  Treatment: 4 oz juice/soda and D50 50 mL (25 gm)  Symptoms: sweaty altered mental status  Follow-up CBG: Time: 0230 CBG Result: 189   Possible Reasons for Event: medication regimen (received 10 U insulin with bicarb and dextrose for hyperkalemia)  Comments/MD notified: MD Billie Ruddy in to see patient at bedside    [name]

## 2021-02-02 NOTE — Progress Notes (Signed)
[facility]  ROUNDING NOTE   Subjective:   [name] is a 60 y.o. black female with end stage renal disease on hemodialysis, atrial fibrillation, coronary artery disease status post CABG, congestive heart failure, CVA, who is admitted to [facility] on 02/01/2021 for Colitis [K52.9] Generalized abdominal pain [R10.84] Acute colitis [K52.9] Generalized weakness [R53.1] Non-intractable vomiting with nausea, unspecified vomiting type [R11.2]  K >7.5   Emergent hemodialysis treatment today for hyperkalemia.     HEMODIALYSIS FLOWSHEET:  Blood Flow Rate (mL/min): 400 mL/min Arterial Pressure (mmHg): -170 mmHg Venous Pressure (mmHg): 160 mmHg Transmembrane Pressure (mmHg): 60 mmHg Ultrafiltration Rate (mL/min): 670 mL/min Dialysate Flow Rate (mL/min): 500 ml/min Conductivity: Machine : 13.8 Conductivity: Machine : 13.8 Dialysis Fluid Bolus: Normal Saline Bolus Amount (mL): 200 mL Dialysate Change: 2K    Objective:  Vital signs in last 24 hours:  Temp:  [98 F (36.7 C)-98.9 F (37.2 C)] 98.7 F (37.1 C) (08/21 1030) Pulse Rate:  [36-89] 72 (08/21 1315) Resp:  [11-20] 16 (08/21 1315) BP: (91-174)/(55-94) 91/66 (08/21 1315) SpO2:  [91 %-100 %] 92 % (08/21 1315) Weight:  [76.1 kg] 76.1 kg (08/20 2035)  Weight change: 3.524 kg Filed Weights   01/31/21 2127 02/01/21 2035  Weight: 72.6 kg 76.1 kg    Intake/Output: I/O last 3 completed shifts: In: 616.8 [IV Piggyback:616.8] Out: -    Intake/Output this shift:  No intake/output data recorded.  Physical Exam: General: NAD, in bed  Head: Normocephalic, atraumatic. Moist oral mucosal membranes  Eyes: Anicteric  Lungs:  Clear to auscultation, normal effort  Heart: Regular rate and rhythm  Abdomen:  Soft, tender midline  Extremities:  no peripheral edema.  Neurologic: Nonfocal, moving all four extremities  Skin: No lesions  Access: Lt AVG    Basic Metabolic Panel: Recent Labs  Lab 01/31/21 2139  02/02/21 0429  NA 136 132*  K 5.6* >7.5*  CL 96* 94*  CO2 27 25  GLUCOSE 90 60*  BUN 36* 59*  CREATININE 6.29* 8.75*  CALCIUM 9.2 9.2  MG  --  2.3     Liver Function Tests: No results for input(s): AST, ALT, ALKPHOS, BILITOT, PROT, ALBUMIN in the last 168 hours. Recent Labs  Lab 02/01/21 1107  LIPASE 36   No results for input(s): AMMONIA in the last 168 hours.  CBC: Recent Labs  Lab 01/31/21 2139 02/02/21 0429  WBC 7.5 8.1  HGB 16.3* 14.5  HCT 48.0* 44.2  MCV 103.7* 101.1*  PLT 118* 109*     Cardiac Enzymes: No results for input(s): CKTOTAL, CKMB, CKMBINDEX, TROPONINI in the last 168 hours.  BNP: Invalid input(s): POCBNP  CBG: Recent Labs  Lab 02/02/21 0841 02/02/21 0851 02/02/21 0917 02/02/21 0933  GLUCAP 27* 189* 142* 130*    Microbiology: Results for orders placed or performed during the hospital encounter of 02/01/21  Culture, blood (routine x 2)     Status: None (Preliminary result)   Collection Time: 02/01/21  2:20 AM   Specimen: BLOOD  Result Value Ref Range Status   Specimen Description BLOOD RIGHT WRIST  Final   Special Requests   Final    BOTTLES DRAWN AEROBIC AND ANAEROBIC Blood Culture results may not be optimal due to an inadequate volume of blood received in culture bottles   Culture   Final    NO GROWTH 1 DAY Performed at [facility], [address], [place], [place] [address]    Report Status PENDING  Incomplete  Resp Panel  by RT-PCR (Flu A&B, Covid) Nasopharyngeal Swab     Status: None   Collection Time: 02/01/21  6:27 AM   Specimen: Nasopharyngeal Swab; Nasopharyngeal(NP) swabs in vial transport medium  Result Value Ref Range Status   SARS Coronavirus 2 by RT PCR NEGATIVE NEGATIVE Final    Comment: (NOTE) SARS-CoV-2 target nucleic acids are NOT DETECTED.  The SARS-CoV-2 RNA is generally detectable in upper respiratory specimens during the acute phase of infection. The lowest concentration of SARS-CoV-2 viral  copies this assay can detect is 138 copies/mL. A negative result does not preclude SARS-Cov-2 infection and should not be used as the sole basis for treatment or other patient management decisions. A negative result may occur with  improper specimen collection/handling, submission of specimen other than nasopharyngeal swab, presence of viral mutation(s) within the areas targeted by this assay, and inadequate number of viral copies(<138 copies/mL). A negative result must be combined with clinical observations, patient history, and epidemiological information. The expected result is Negative.  Fact Sheet for Patients:  EntrepreneurPulse.com.au  Fact Sheet for Healthcare Providers:  IncredibleEmployment.be  This test is no t yet approved or cleared by the [place] FDA and  has been authorized for detection and/or diagnosis of SARS-CoV-2 by FDA under an Emergency Use Authorization (EUA). This EUA will remain  in effect (meaning this test can be used) for the duration of the COVID-19 declaration under Section 564(b)(1) of the Act, 21 U.S.C.section 360bbb-3(b)(1), unless the authorization is terminated  or revoked sooner.       Influenza A by PCR NEGATIVE NEGATIVE Final   Influenza B by PCR NEGATIVE NEGATIVE Final    Comment: (NOTE) The Xpert Xpress SARS-CoV-2/FLU/RSV plus assay is intended as an aid in the diagnosis of influenza from Nasopharyngeal swab specimens and should not be used as a sole basis for treatment. Nasal washings and aspirates are unacceptable for Xpert Xpress SARS-CoV-2/FLU/RSV testing.  Fact Sheet for Patients: EntrepreneurPulse.com.au  Fact Sheet for Healthcare Providers: IncredibleEmployment.be  This test is not yet approved or cleared by the [place] FDA and has been authorized for detection and/or diagnosis of SARS-CoV-2 by FDA under an Emergency Use Authorization (EUA). This  EUA will remain in effect (meaning this test can be used) for the duration of the COVID-19 declaration under Section 564(b)(1) of the Act, 21 U.S.C. section 360bbb-3(b)(1), unless the authorization is terminated or revoked.  Performed at [facility], [address] [place], [place] [address]   Blood culture (single)     Status: None (Preliminary result)   Collection Time: 02/01/21  6:43 AM   Specimen: BLOOD  Result Value Ref Range Status   Specimen Description BLOOD RIGHT ANTECUBITAL  Final   Special Requests   Final    BOTTLES DRAWN AEROBIC AND ANAEROBIC Blood Culture adequate volume   Culture   Final    NO GROWTH < 24 HOURS Performed at [facility], [address] [place], [place] [address]    Report Status PENDING  Incomplete  MRSA Next Gen by PCR, Nasal     Status: Abnormal   Collection Time: 02/02/21  8:52 AM   Specimen: Nasal Mucosa; Nasal Swab  Result Value Ref Range Status   MRSA by PCR Next Gen DETECTED (A) NOT DETECTED Final    Comment: RESULT CALLED TO, READ BACK BY AND VERIFIED WITH: LYDIA ILLERBRUN 02/02/21 @ 1009 BY SB (NOTE) The GeneXpert MRSA Assay (FDA approved for NASAL specimens only), is one component of a comprehensive MRSA colonization  surveillance program. It is not intended to diagnose MRSA infection nor to guide or monitor treatment for MRSA infections. Test performance is not FDA approved in patients less than 76 years old. Performed at [facility], [address] [place], [place] [address]     Coagulation Studies: No results for input(s): LABPROT, INR in the last 72 hours.  Urinalysis: No results for input(s): COLORURINE, LABSPEC, PHURINE, GLUCOSEU, HGBUR, BILIRUBINUR, KETONESUR, PROTEINUR, UROBILINOGEN, NITRITE, LEUKOCYTESUR in the last 72 hours.  Invalid input(s): APPERANCEUR    Imaging: CT Abdomen Pelvis W Contrast  Result Date: 02/01/2021 CLINICAL DATA:  Nausea, vomiting, right lower quadrant  abdominal pain EXAM: CT ABDOMEN AND PELVIS WITH CONTRAST TECHNIQUE: Multidetector CT imaging of the abdomen and pelvis was performed using the standard protocol following bolus administration of intravenous contrast. CONTRAST:  75 cc Omnipaque 350 COMPARISON:  None. FINDINGS: Lower chest: Mild bibasilar atelectasis. Moderate cardiomegaly. Extensive coronary artery calcification Hepatobiliary: Cholelithiasis without pericholecystic inflammatory change. No intra or extrahepatic biliary ductal dilation. Liver unremarkable. Pancreas: Unremarkable Spleen: Unremarkable Adrenals/Urinary Tract: The adrenal glands are unremarkable. Marked bilateral renal cortical atrophy is present in keeping with changes of chronic renal failure. The bladder is decompressed and is unremarkable. Stomach/Bowel: The stomach and small bowel are unremarkable. There is mild circumferential bowel wall thickening involving the cecum with mild pericolonic soft tissue infiltration noted, best seen on image # 57/2. There is mild infiltration of the adjacent omental fat. In the acute setting, this may represent a focal infectious or inflammatory colitis with adjacent inflammatory change. Alternatively, this may represent a a focal mural mass with transmural infiltration. The terminal is unremarkable. The adjacent appendix is well visualized and is normal. There is no evidence of obstruction or perforation. No free intraperitoneal gas or fluid Vascular/Lymphatic: There is extensive aortoiliac atherosclerotic calcification. No aortic aneurysm. No pathologic adenopathy within the abdomen and pelvis. Reproductive: Multiple calcified uterine fibroids are again identified. The pelvic organs are otherwise unremarkable. Other: Tiny fat containing umbilical hernia.  Rectum unremarkable. Musculoskeletal: No acute bone abnormality. No lytic or blastic bone lesions. Diffuse sclerosis of the osseous structures is in keeping with renal osteodystrophy. IMPRESSION:  Mild focal cecal bowel wall thickening and pericolonic soft tissue infiltration. In the acute setting, this may represent a focal infectious or inflammatory colitis in adjacent inflammatory change. However, an infiltrative mural mass could appear similarly with pericolonic changes representing transmural extension of disease. Correlation with recent screening endoscopy is recommended. No evidence of obstruction or focal inflammation. Cholelithiasis. Changes in keeping with chronic renal failure. In this setting, diffuse osseous sclerosis may reflect changes of renal osteodystrophy. Extensive coronary artery calcification.  Moderate cardiomegaly. Aortic Atherosclerosis (ICD10-I70.0). Electronically Signed   By: Fidela Salisbury M.D.   On: 02/01/2021 03:39   DG Chest Port 1 View  Result Date: 02/01/2021 CLINICAL DATA:  Chest pain. EXAM: PORTABLE CHEST 1 VIEW COMPARISON:  Chest radiograph dated 09/30/2020. FINDINGS: Cardiomegaly with mild vascular congestion. No focal consolidation, pleural effusion, or pneumothorax. Median sternotomy wires and coronary stent. No acute osseous pathology. Vascular stent in the left upper extremity. IMPRESSION: Cardiomegaly with mild vascular congestion. No focal consolidation. Electronically Signed   By: Anner Crete M.D.   On: 02/01/2021 02:41     Medications:    sodium chloride     sodium chloride      apixaban  5 mg Oral BID   patiromer  16.8 g Oral Daily   sodium zirconium cyclosilicate  10 g Oral BID   sodium  chloride, sodium chloride, acetaminophen **OR** acetaminophen, heparin, HYDROcodone-acetaminophen, lidocaine-prilocaine, ondansetron **OR** ondansetron (ZOFRAN) IV  Assessment/ Plan:  Ms. .name is a 60 y.o. black female with  end stage renal disease on hemodialysis, atrial fibrillation, coronary artery disease status post CABG, congestive heart failure, CVA, who is admitted to [facility] on 02/01/2021 for Colitis [K52.9] Generalized abdominal pain  [R10.84] Acute colitis [K52.9] Generalized weakness [R53.1] Non-intractable vomiting with nausea, unspecified vomiting type [R11.2]  [facility] MWF [place] Left AVF 71kg.   End stage renal disease on HD: with hyperkalemia. Emergent hemodialysis treatment today. 1K bath for the first hour.  - Continue MWF schedule, next treatment for Monday.  - low potassium diet.  - Lokelma  2. Anemia of chronic kidney disease Lab Results  Component Value Date   HGB 14.5 02/02/2021    Hgb above goal. No indication for ESA  3. Secondary Hyperparathyroidism: outpatient labs on 8/8 phos 4.6, ca 8.1 and PTH 228 - at goal.  - Currently holding sevelamer and cinwith meals due to colitis. Marland Kitchen   4. Hypertension: holding home blood pressure medications. Home regimen of carvedilol, isosorbide mononitrate, Entresto.     LOS: 1 Zarek Relph 8/21/20221:28 PM

## 2021-02-02 NOTE — Progress Notes (Addendum)
Patient tolerated 3 hrs HD treatment, cannulated using 15g needle without incident.  UF goal of 1.5L achieved  BMP collected and send to lab at the end of dialysis

## 2021-02-02 NOTE — Progress Notes (Signed)
Jonathon Bellows , MD [address], [place], [place], [place], [address] [address] [place], [place], [place], [address] Phone: [phone]  Fax: [phone]   Tajah Noguchi Snoke is being followed for acute colitis  Day 1 of follow up   Subjective: Abdominal pain much better, not had a bowel movement    Objective: Vital signs in last 24 hours: Vitals:   02/02/21 0448 02/02/21 0727 02/02/21 0736 02/02/21 0828  BP: (!) 107/55 123/77 122/68 (!) 174/87  Pulse: (!) 57 74 63 84  Resp: 18 18 18 16   Temp: 98.9 F (37.2 C)  98 F (36.7 C) 98 F (36.7 C)  TempSrc: Oral  Oral Axillary  SpO2: 95% 100% 100% 100%  Weight:      Height:       Weight change: 3.524 kg No intake or output data in the 24 hours ending 02/02/21 0935   Exam: Heart:: Regular rate and rhythm, S1S2 present, or without murmur or extra heart sounds Lungs: normal and clear to auscultation Abdomen: soft, nontender, normal bowel sounds   Lab Results: @LABTEST2 @ Micro Results: Recent Results (from the past 240 hour(s))  Culture, blood (routine x 2)     Status: None (Preliminary result)   Collection Time: 02/01/21  2:20 AM   Specimen: BLOOD  Result Value Ref Range Status   Specimen Description BLOOD RIGHT WRIST  Final   Special Requests   Final    BOTTLES DRAWN AEROBIC AND ANAEROBIC Blood Culture results may not be optimal due to an inadequate volume of blood received in culture bottles   Culture   Final    NO GROWTH 1 DAY Performed at [facility], [address], [place], [place] [address]    Report Status PENDING  Incomplete  Resp Panel by RT-PCR (Flu A&B, Covid) Nasopharyngeal Swab     Status: None   Collection Time: 02/01/21  6:27 AM   Specimen: Nasopharyngeal Swab; Nasopharyngeal(NP) swabs in vial transport medium  Result Value Ref Range Status   SARS Coronavirus 2 by RT PCR NEGATIVE NEGATIVE Final    Comment: (NOTE) SARS-CoV-2 target nucleic acids are NOT DETECTED.  The SARS-CoV-2  RNA is generally detectable in upper respiratory specimens during the acute phase of infection. The lowest concentration of SARS-CoV-2 viral copies this assay can detect is 138 copies/mL. A negative result does not preclude SARS-Cov-2 infection and should not be used as the sole basis for treatment or other patient management decisions. A negative result may occur with  improper specimen collection/handling, submission of specimen other than nasopharyngeal swab, presence of viral mutation(s) within the areas targeted by this assay, and inadequate number of viral copies(<138 copies/mL). A negative result must be combined with clinical observations, patient history, and epidemiological information. The expected result is Negative.  Fact Sheet for Patients:  EntrepreneurPulse.com.au  Fact Sheet for Healthcare Providers:  IncredibleEmployment.be  This test is no t yet approved or cleared by the [place] FDA and  has been authorized for detection and/or diagnosis of SARS-CoV-2 by FDA under an Emergency Use Authorization (EUA). This EUA will remain  in effect (meaning this test can be used) for the duration of the COVID-19 declaration under Section 564(b)(1) of the Act, 21 U.S.C.section 360bbb-3(b)(1), unless the authorization is terminated  or revoked sooner.       Influenza A by PCR NEGATIVE NEGATIVE Final   Influenza B by PCR NEGATIVE NEGATIVE Final    Comment: (NOTE) The Xpert Xpress SARS-CoV-2/FLU/RSV plus assay is  intended as an aid in the diagnosis of influenza from Nasopharyngeal swab specimens and should not be used as a sole basis for treatment. Nasal washings and aspirates are unacceptable for Xpert Xpress SARS-CoV-2/FLU/RSV testing.  Fact Sheet for Patients: EntrepreneurPulse.com.au  Fact Sheet for Healthcare Providers: IncredibleEmployment.be  This test is not yet approved or cleared by the  [place] FDA and has been authorized for detection and/or diagnosis of SARS-CoV-2 by FDA under an Emergency Use Authorization (EUA). This EUA will remain in effect (meaning this test can be used) for the duration of the COVID-19 declaration under Section 564(b)(1) of the Act, 21 U.S.C. section 360bbb-3(b)(1), unless the authorization is terminated or revoked.  Performed at [facility], [address] [place], [place] [address]   Blood culture (single)     Status: None (Preliminary result)   Collection Time: 02/01/21  6:43 AM   Specimen: BLOOD  Result Value Ref Range Status   Specimen Description BLOOD RIGHT ANTECUBITAL  Final   Special Requests   Final    BOTTLES DRAWN AEROBIC AND ANAEROBIC Blood Culture adequate volume   Culture   Final    NO GROWTH < 24 HOURS Performed at [facility], [address], [place], [place] [address]    Report Status PENDING  Incomplete   Studies/Results: CT Abdomen Pelvis W Contrast  Result Date: 02/01/2021 CLINICAL DATA:  Nausea, vomiting, right lower quadrant abdominal pain EXAM: CT ABDOMEN AND PELVIS WITH CONTRAST TECHNIQUE: Multidetector CT imaging of the abdomen and pelvis was performed using the standard protocol following bolus administration of intravenous contrast. CONTRAST:  75 cc Omnipaque 350 COMPARISON:  None. FINDINGS: Lower chest: Mild bibasilar atelectasis. Moderate cardiomegaly. Extensive coronary artery calcification Hepatobiliary: Cholelithiasis without pericholecystic inflammatory change. No intra or extrahepatic biliary ductal dilation. Liver unremarkable. Pancreas: Unremarkable Spleen: Unremarkable Adrenals/Urinary Tract: The adrenal glands are unremarkable. Marked bilateral renal cortical atrophy is present in keeping with changes of chronic renal failure. The bladder is decompressed and is unremarkable. Stomach/Bowel: The stomach and small bowel are unremarkable. There is mild circumferential bowel wall  thickening involving the cecum with mild pericolonic soft tissue infiltration noted, best seen on image # 57/2. There is mild infiltration of the adjacent omental fat. In the acute setting, this may represent a focal infectious or inflammatory colitis with adjacent inflammatory change. Alternatively, this may represent a a focal mural mass with transmural infiltration. The terminal is unremarkable. The adjacent appendix is well visualized and is normal. There is no evidence of obstruction or perforation. No free intraperitoneal gas or fluid Vascular/Lymphatic: There is extensive aortoiliac atherosclerotic calcification. No aortic aneurysm. No pathologic adenopathy within the abdomen and pelvis. Reproductive: Multiple calcified uterine fibroids are again identified. The pelvic organs are otherwise unremarkable. Other: Tiny fat containing umbilical hernia.  Rectum unremarkable. Musculoskeletal: No acute bone abnormality. No lytic or blastic bone lesions. Diffuse sclerosis of the osseous structures is in keeping with renal osteodystrophy. IMPRESSION: Mild focal cecal bowel wall thickening and pericolonic soft tissue infiltration. In the acute setting, this may represent a focal infectious or inflammatory colitis in adjacent inflammatory change. However, an infiltrative mural mass could appear similarly with pericolonic changes representing transmural extension of disease. Correlation with recent screening endoscopy is recommended. No evidence of obstruction or focal inflammation. Cholelithiasis. Changes in keeping with chronic renal failure. In this setting, diffuse osseous sclerosis may reflect changes of renal osteodystrophy. Extensive coronary artery calcification.  Moderate cardiomegaly. Aortic Atherosclerosis (ICD10-I70.0). Electronically Signed   By: Fidela Salisbury  M.D.   On: 02/01/2021 03:39   DG Chest Port 1 View  Result Date: 02/01/2021 CLINICAL DATA:  Chest pain. EXAM: PORTABLE CHEST 1 VIEW COMPARISON:   Chest radiograph dated 09/30/2020. FINDINGS: Cardiomegaly with mild vascular congestion. No focal consolidation, pleural effusion, or pneumothorax. Median sternotomy wires and coronary stent. No acute osseous pathology. Vascular stent in the left upper extremity. IMPRESSION: Cardiomegaly with mild vascular congestion. No focal consolidation. Electronically Signed   By: Anner Crete M.D.   On: 02/01/2021 02:41   Medications: I have reviewed the patient's current medications. Scheduled Meds:  apixaban  5 mg Oral BID   patiromer  16.8 g Oral Daily   sodium zirconium cyclosilicate  10 g Oral BID   Continuous Infusions:  sodium chloride     sodium chloride     PRN Meds:.sodium chloride, sodium chloride, acetaminophen **OR** acetaminophen, heparin, HYDROcodone-acetaminophen, lidocaine-prilocaine, ondansetron **OR** ondansetron (ZOFRAN) IV   Assessment: Active Problems:   S/P CABG x 3   HFrEF (heart failure with reduced ejection fraction) (HCC)   ESRD (end stage renal disease) on dialysis (HCC)   Paroxysmal atrial fibrillation ([place])   Acute colitis  CHERYLE DARK is a 60 y.o. y/o female presents to the emergency room with abdominal pain and CT scan findings of possible acute colitis multiple mass in the cecum.  She has been started on antibiotics.  No stool studies have been obtained since she has no diarrhea. Likely an infectious cause which shold resolve usually with conservative management, presently improving    Plan 1.  Get stool studies if has diarrhea   2. Outpatient colonoscopy within 2-4 weeks.   She follows with [facility] gastroenterology and will need an appointment with them after discharge.   3. Serial abdominal exam and if pain or tenderness worsens get repeat imaging     LOS: 1 day   Jonathon Bellows, MD 02/02/2021, 9:35 AM

## 2021-02-02 NOTE — Progress Notes (Signed)
PROGRESS NOTE    [name]  UJW:[id number] DOB: 15-Jul-1960 DOA: 02/01/2021 PCP: [name], MD  [phone]   Assessment & Plan:   Active Problems:   S/P CABG x 3   HFrEF (heart failure with reduced ejection fraction) (HCC)   ESRD (end stage renal disease) on dialysis (HCC)   Paroxysmal atrial fibrillation ([place])   Acute colitis   [name] is a 60 y.o. female with medical history significant for ESRD on HD MWF, paroxysmal A. fib on Eliquis, HFrEF, CAD s/p CABG, who was sent from dialysis, with a complaint of generalized abdominal pain that started while at dialysis the day prior.  The pain is of severe intensity in the mid abdomen radiating to the flanks and up to epigastrium and associated with nausea and vomiting that is nonbloody and nonbilious.       Acute colitis - Patient with abdominal pain with CT abdomen and pelvis showing possible colitis, possible infiltrative mural mass --GI consulted Plan: --advance to soft diet, per pt request --Outpatient colonoscopy within 2-4 weeks.  - Pain control and supportive care  Elevated troponin - Slightly elevated troponin of 66 and 58, which is chronic, likely due to ESRD   CAD S/P CABG x 3 - resume statin after discharge     HFrEF (heart failure with reduced ejection fraction)  - Appears euvolemic --hold coreg and Entresto     ESRD (end stage renal disease) on dialysis (HCC) Hyperkalemia --emergent dialysis today due to hyperkalemia - Continue Renvela --cont [place]     Paroxysmal atrial fibrillation (HCC) - hold home coreg and amiodarone --cont Eliquis  Hypoglycemia iatrogenic  --BG dropped to 27 after insulin 10u +D50 ordered for hyperglycemia --BG q6h for 1 day to monitor    DVT prophylaxis: QI:ONGEXBM Code Status: Full code  Family Communication:  Level of care: Med-Surg Dispo:   The patient is from: home Anticipated d/c is to: home Anticipated d/c date is: likely tomorrow Patient  currently is not medically ready to d/c due to: monitor for hypoglycemia and hyperkalemia   Subjective and Interval History:  Potassium >7.5 from morning labs.  Night oncall ordered insulin 10u +D50.  About 2 hours later, pt was found suddenly confused, BG 27.  Rapid called, another D50 given, and pt's mental status returned back to baseline.  Received emergent dialysis this morning.  Pt still complained of abdominal pain but wanted to eat solid foods.   Objective: Vitals:   02/02/21 1315 02/02/21 1330 02/02/21 1432 02/02/21 1635  BP: 91/66 112/80 126/71 (!) 95/53  Pulse: 72 74 77 69  Resp: 16 15 18 18   Temp:   98 F (36.7 C) 98.5 F (36.9 C)  TempSrc:   Oral Oral  SpO2: 92% 97% 99% 100%  Weight:      Height:        Intake/Output Summary (Last 24 hours) at 02/02/2021 1720 Last data filed at 02/02/2021 1330 Gross per 24 hour  Intake --  Output 1500 ml  Net -1500 ml   Filed Weights   01/31/21 2127 02/01/21 2035  Weight: 72.6 kg 76.1 kg    Examination:   Constitutional: NAD, AAOx3 HEENT: conjunctivae and lids normal, EOMI CV: No cyanosis.   RESP: normal respiratory effort, on RA Extremities: No effusions, edema in BLE SKIN: warm, dry Neuro: II - XII grossly intact.   Psych: Normal mood and affect.  Appropriate judgement and reason   Data Reviewed: I have personally reviewed following labs and  imaging studies  CBC: Recent Labs  Lab 01/31/21 2139 02/02/21 0429  WBC 7.5 8.1  HGB 16.3* 14.5  HCT 48.0* 44.2  MCV 103.7* 101.1*  PLT 118* 932*   Basic Metabolic Panel: Recent Labs  Lab 01/31/21 2139 02/02/21 0429 02/02/21 1035  NA 136 132*  --   K 5.6* >7.5* 3.7  CL 96* 94*  --   CO2 27 25  --   GLUCOSE 90 60*  --   BUN 36* 59*  --   CREATININE 6.29* 8.75*  --   CALCIUM 9.2 9.2  --   MG  --  2.3  --    GFR: Estimated Creatinine Clearance: 7.3 mL/min (A) (by C-G formula based on SCr of 8.75 mg/dL (H)). Liver Function Tests: No results for input(s):  AST, ALT, ALKPHOS, BILITOT, PROT, ALBUMIN in the last 168 hours. Recent Labs  Lab 02/01/21 1107  LIPASE 36   No results for input(s): AMMONIA in the last 168 hours. Coagulation Profile: No results for input(s): INR, PROTIME in the last 168 hours. Cardiac Enzymes: No results for input(s): CKTOTAL, CKMB, CKMBINDEX, TROPONINI in the last 168 hours. BNP (last 3 results) No results for input(s): PROBNP in the last 8760 hours. HbA1C: No results for input(s): HGBA1C in the last 72 hours. CBG: Recent Labs  Lab 02/02/21 0841 02/02/21 0851 02/02/21 0917 02/02/21 0933 02/02/21 1426  GLUCAP 27* 189* 142* 130* 72   Lipid Profile: No results for input(s): CHOL, HDL, LDLCALC, TRIG, CHOLHDL, LDLDIRECT in the last 72 hours. Thyroid Function Tests: No results for input(s): TSH, T4TOTAL, FREET4, T3FREE, THYROIDAB in the last 72 hours. Anemia Panel: No results for input(s): VITAMINB12, FOLATE, FERRITIN, TIBC, IRON, RETICCTPCT in the last 72 hours. Sepsis Labs: Recent Labs  Lab 02/01/21 0220  LATICACIDVEN 1.6    Recent Results (from the past 240 hour(s))  Culture, blood (routine x 2)     Status: None (Preliminary result)   Collection Time: 02/01/21  2:20 AM   Specimen: BLOOD  Result Value Ref Range Status   Specimen Description BLOOD RIGHT WRIST  Final   Special Requests   Final    BOTTLES DRAWN AEROBIC AND ANAEROBIC Blood Culture results may not be optimal due to an inadequate volume of blood received in culture bottles   Culture   Final    NO GROWTH 1 DAY Performed at [facility], [address], [place], [place] [address]    Report Status PENDING  Incomplete  Resp Panel by RT-PCR (Flu A&B, Covid) Nasopharyngeal Swab     Status: None   Collection Time: 02/01/21  6:27 AM   Specimen: Nasopharyngeal Swab; Nasopharyngeal(NP) swabs in vial transport medium  Result Value Ref Range Status   SARS Coronavirus 2 by RT PCR NEGATIVE NEGATIVE Final    Comment: (NOTE) SARS-CoV-2  target nucleic acids are NOT DETECTED.  The SARS-CoV-2 RNA is generally detectable in upper respiratory specimens during the acute phase of infection. The lowest concentration of SARS-CoV-2 viral copies this assay can detect is 138 copies/mL. A negative result does not preclude SARS-Cov-2 infection and should not be used as the sole basis for treatment or other patient management decisions. A negative result may occur with  improper specimen collection/handling, submission of specimen other than nasopharyngeal swab, presence of viral mutation(s) within the areas targeted by this assay, and inadequate number of viral copies(<138 copies/mL). A negative result must be combined with clinical observations, patient history, and epidemiological information. The expected result is Negative.  Fact Sheet for Patients:  EntrepreneurPulse.com.au  Fact Sheet for Healthcare Providers:  IncredibleEmployment.be  This test is no t yet approved or cleared by the [place] FDA and  has been authorized for detection and/or diagnosis of SARS-CoV-2 by FDA under an Emergency Use Authorization (EUA). This EUA will remain  in effect (meaning this test can be used) for the duration of the COVID-19 declaration under Section 564(b)(1) of the Act, 21 U.S.C.section 360bbb-3(b)(1), unless the authorization is terminated  or revoked sooner.       Influenza A by PCR NEGATIVE NEGATIVE Final   Influenza B by PCR NEGATIVE NEGATIVE Final    Comment: (NOTE) The Xpert Xpress SARS-CoV-2/FLU/RSV plus assay is intended as an aid in the diagnosis of influenza from Nasopharyngeal swab specimens and should not be used as a sole basis for treatment. Nasal washings and aspirates are unacceptable for Xpert Xpress SARS-CoV-2/FLU/RSV testing.  Fact Sheet for Patients: EntrepreneurPulse.com.au  Fact Sheet for Healthcare  Providers: IncredibleEmployment.be  This test is not yet approved or cleared by the [place] FDA and has been authorized for detection and/or diagnosis of SARS-CoV-2 by FDA under an Emergency Use Authorization (EUA). This EUA will remain in effect (meaning this test can be used) for the duration of the COVID-19 declaration under Section 564(b)(1) of the Act, 21 U.S.C. section 360bbb-3(b)(1), unless the authorization is terminated or revoked.  Performed at [facility], [address] [place], [place] [address]   Blood culture (single)     Status: None (Preliminary result)   Collection Time: 02/01/21  6:43 AM   Specimen: BLOOD  Result Value Ref Range Status   Specimen Description BLOOD RIGHT ANTECUBITAL  Final   Special Requests   Final    BOTTLES DRAWN AEROBIC AND ANAEROBIC Blood Culture adequate volume   Culture   Final    NO GROWTH < 24 HOURS Performed at [facility], [address] [place], [place] [address]    Report Status PENDING  Incomplete  MRSA Next Gen by PCR, Nasal     Status: Abnormal   Collection Time: 02/02/21  8:52 AM   Specimen: Nasal Mucosa; Nasal Swab  Result Value Ref Range Status   MRSA by PCR Next Gen DETECTED (A) NOT DETECTED Final    Comment: RESULT CALLED TO, READ BACK BY AND VERIFIED WITH: LYDIA ILLERBRUN 02/02/21 @ 1009 BY SB (NOTE) The GeneXpert MRSA Assay (FDA approved for NASAL specimens only), is one component of a comprehensive MRSA colonization surveillance program. It is not intended to diagnose MRSA infection nor to guide or monitor treatment for MRSA infections. Test performance is not FDA approved in patients less than 29 years old. Performed at [facility], [address], [place], [place] [address]       Radiology Studies: CT Abdomen Pelvis W Contrast  Result Date: 02/01/2021 CLINICAL DATA:  Nausea, vomiting, right lower quadrant abdominal pain EXAM: CT ABDOMEN AND PELVIS WITH  CONTRAST TECHNIQUE: Multidetector CT imaging of the abdomen and pelvis was performed using the standard protocol following bolus administration of intravenous contrast. CONTRAST:  75 cc Omnipaque 350 COMPARISON:  None. FINDINGS: Lower chest: Mild bibasilar atelectasis. Moderate cardiomegaly. Extensive coronary artery calcification Hepatobiliary: Cholelithiasis without pericholecystic inflammatory change. No intra or extrahepatic biliary ductal dilation. Liver unremarkable. Pancreas: Unremarkable Spleen: Unremarkable Adrenals/Urinary Tract: The adrenal glands are unremarkable. Marked bilateral renal cortical atrophy is present in keeping with changes of chronic renal failure. The bladder is decompressed and is unremarkable. Stomach/Bowel: The stomach  and small bowel are unremarkable. There is mild circumferential bowel wall thickening involving the cecum with mild pericolonic soft tissue infiltration noted, best seen on image # 57/2. There is mild infiltration of the adjacent omental fat. In the acute setting, this may represent a focal infectious or inflammatory colitis with adjacent inflammatory change. Alternatively, this may represent a a focal mural mass with transmural infiltration. The terminal is unremarkable. The adjacent appendix is well visualized and is normal. There is no evidence of obstruction or perforation. No free intraperitoneal gas or fluid Vascular/Lymphatic: There is extensive aortoiliac atherosclerotic calcification. No aortic aneurysm. No pathologic adenopathy within the abdomen and pelvis. Reproductive: Multiple calcified uterine fibroids are again identified. The pelvic organs are otherwise unremarkable. Other: Tiny fat containing umbilical hernia.  Rectum unremarkable. Musculoskeletal: No acute bone abnormality. No lytic or blastic bone lesions. Diffuse sclerosis of the osseous structures is in keeping with renal osteodystrophy. IMPRESSION: Mild focal cecal bowel wall thickening and  pericolonic soft tissue infiltration. In the acute setting, this may represent a focal infectious or inflammatory colitis in adjacent inflammatory change. However, an infiltrative mural mass could appear similarly with pericolonic changes representing transmural extension of disease. Correlation with recent screening endoscopy is recommended. No evidence of obstruction or focal inflammation. Cholelithiasis. Changes in keeping with chronic renal failure. In this setting, diffuse osseous sclerosis may reflect changes of renal osteodystrophy. Extensive coronary artery calcification.  Moderate cardiomegaly. Aortic Atherosclerosis (ICD10-I70.0). Electronically Signed   By: Fidela Salisbury M.D.   On: 02/01/2021 03:39   DG Chest Port 1 View  Result Date: 02/01/2021 CLINICAL DATA:  Chest pain. EXAM: PORTABLE CHEST 1 VIEW COMPARISON:  Chest radiograph dated 09/30/2020. FINDINGS: Cardiomegaly with mild vascular congestion. No focal consolidation, pleural effusion, or pneumothorax. Median sternotomy wires and coronary stent. No acute osseous pathology. Vascular stent in the left upper extremity. IMPRESSION: Cardiomegaly with mild vascular congestion. No focal consolidation. Electronically Signed   By: Anner Crete M.D.   On: 02/01/2021 02:41     Scheduled Meds:  apixaban  5 mg Oral BID   patiromer  16.8 g Oral Daily   Continuous Infusions:   LOS: 1 day     Enzo Bi, MD [facility] If 7PM-7AM, please contact night-coverage 02/02/2021, 5:20 PM

## 2021-02-02 NOTE — Progress Notes (Signed)
Patient suddenly confused, diaphoretic, and repeating the word "okay." MD Billie Ruddy paged and coming to bedside. VSS, with an elevated BP of 174/87. Patient to go to dialysis as soon as dialysis RN arrives. Charge RN [employer] notified and called [place] in to see patient.

## 2021-02-03 LAB — CBC
HCT: 42.6 % (ref 36.0–46.0)
Hemoglobin: 14.1 g/dL (ref 12.0–15.0)
MCH: 34.3 pg — ABNORMAL HIGH (ref 26.0–34.0)
MCHC: 33.1 g/dL (ref 30.0–36.0)
MCV: 103.6 fL — ABNORMAL HIGH (ref 80.0–100.0)
Platelets: 113 10*3/uL — ABNORMAL LOW (ref 150–400)
RBC: 4.11 MIL/uL (ref 3.87–5.11)
RDW: 14.3 % (ref 11.5–15.5)
WBC: 6.6 10*3/uL (ref 4.0–10.5)
nRBC: 0 % (ref 0.0–0.2)

## 2021-02-03 LAB — BASIC METABOLIC PANEL
Anion gap: 10 (ref 5–15)
BUN: 45 mg/dL — ABNORMAL HIGH (ref 6–20)
CO2: 31 mmol/L (ref 22–32)
Calcium: 9.1 mg/dL (ref 8.9–10.3)
Chloride: 95 mmol/L — ABNORMAL LOW (ref 98–111)
Creatinine, Ser: 6.7 mg/dL — ABNORMAL HIGH (ref 0.44–1.00)
GFR, Estimated: 7 mL/min — ABNORMAL LOW (ref >60)
Glucose, Bld: 89 mg/dL (ref 70–99)
Potassium: 4.9 mmol/L (ref 3.5–5.1)
Sodium: 136 mmol/L (ref 135–145)

## 2021-02-03 LAB — GLUCOSE, CAPILLARY
Glucose-Capillary: 123 mg/dL — ABNORMAL HIGH (ref 70–99)
Glucose-Capillary: 98 mg/dL (ref 70–99)

## 2021-02-03 LAB — MAGNESIUM: Magnesium: 2.2 mg/dL (ref 1.7–2.4)

## 2021-02-03 MED ORDER — ENTRESTO 24-26 MG PO TABS: ORAL_TABLET | ORAL | Status: DC

## 2021-02-03 NOTE — Progress Notes (Signed)
Noted pt blood pressure has been trending down, pt is alert and active. She stated that her blood pressure tend to drop on dialysis and that reducing her temp. On her machine helps stabilize that pressure after speaking with RN her temp has been decreased to 35.5 will continue to monitor the patient closely for any further changes .

## 2021-02-03 NOTE — Progress Notes (Signed)
Tx initiated without issue, cannulated successfully. UF goal of 1.5Kg.

## 2021-02-03 NOTE — Progress Notes (Signed)
[facility]  ROUNDING NOTE   Subjective:   [name] is a 60 y.o. black female with end stage renal disease on hemodialysis, atrial fibrillation, coronary artery disease status post CABG, congestive heart failure, CVA, who is admitted to [facility] on 02/01/2021 for Colitis [K52.9] Generalized abdominal pain [R10.84] Acute colitis [K52.9] Generalized weakness [R53.1] Non-intractable vomiting with nausea, unspecified vomiting type [R11.2]  Patient seen today during dialysis   HEMODIALYSIS FLOWSHEET:  Blood Flow Rate (mL/min): 400 mL/min Arterial Pressure (mmHg): -100 mmHg Venous Pressure (mmHg): 60 mmHg Transmembrane Pressure (mmHg): 60 mmHg Ultrafiltration Rate (mL/min): 70 mL/min Dialysate Flow Rate (mL/min): 500 ml/min Conductivity: Machine : 13.7 Conductivity: Machine : 13.7 Dialysis Fluid Bolus: Normal Saline Bolus Amount (mL): 200 mL Dialysate Change: 2K  No complaints at this time   Objective:  Vital signs in last 24 hours:  Temp:  [98 F (36.7 C)-99 F (37.2 C)] 98.3 F (36.8 C) (08/22 0928) Pulse Rate:  [69-84] 84 (08/22 0928) Resp:  [11-19] 11 (08/22 1230) BP: (80-134)/(48-80) 110/67 (08/22 1230) SpO2:  [97 %-100 %] 100 % (08/22 0928)  Weight change:  Filed Weights   01/31/21 2127 02/01/21 2035  Weight: 72.6 kg 76.1 kg    Intake/Output: I/O last 3 completed shifts: In: -  Out: 1500 [Other:1500]   Intake/Output this shift:  Total I/O In: -  Out: 1000 [Other:1000]  Physical Exam: General: NAD, in bed  Head: Normocephalic, atraumatic. Moist oral mucosal membranes  Eyes: Anicteric  Lungs:  Clear to auscultation, normal effort  Heart: Regular rate and rhythm  Abdomen:  Soft, tender midline  Extremities:  no peripheral edema.  Neurologic: Nonfocal, moving all four extremities  Skin: No lesions  Access: Lt AVG    Basic Metabolic Panel: Recent Labs  Lab 01/31/21 2139 02/02/21 0429 02/02/21 1035 02/03/21 0428  NA 136 132*  --   136  K 5.6* >7.5* 3.7 4.9  CL 96* 94*  --  95*  CO2 27 25  --  31  GLUCOSE 90 60*  --  89  BUN 36* 59*  --  45*  CREATININE 6.29* 8.75*  --  6.70*  CALCIUM 9.2 9.2  --  9.1  MG  --  2.3  --  2.2     Liver Function Tests: No results for input(s): AST, ALT, ALKPHOS, BILITOT, PROT, ALBUMIN in the last 168 hours. Recent Labs  Lab 02/01/21 1107  LIPASE 36    No results for input(s): AMMONIA in the last 168 hours.  CBC: Recent Labs  Lab 01/31/21 2139 02/02/21 0429 02/03/21 0428  WBC 7.5 8.1 6.6  HGB 16.3* 14.5 14.1  HCT 48.0* 44.2 42.6  MCV 103.7* 101.1* 103.6*  PLT 118* 109* 113*     Cardiac Enzymes: No results for input(s): CKTOTAL, CKMB, CKMBINDEX, TROPONINI in the last 168 hours.  BNP: Invalid input(s): POCBNP  CBG: Recent Labs  Lab 02/02/21 1426 02/02/21 1801 02/02/21 2354 02/03/21 0531 02/03/21 0748  GLUCAP 72 125* 102* 123* 68     Microbiology: Results for orders placed or performed during the hospital encounter of 02/01/21  Culture, blood (routine x 2)     Status: None (Preliminary result)   Collection Time: 02/01/21  2:20 AM   Specimen: BLOOD  Result Value Ref Range Status   Specimen Description BLOOD RIGHT WRIST  Final   Special Requests   Final    BOTTLES DRAWN AEROBIC AND ANAEROBIC Blood Culture results may not be optimal due to an inadequate volume  of blood received in culture bottles   Culture   Final    NO GROWTH 2 DAYS Performed at [facility], [address] [place], [place] [address]    Report Status PENDING  Incomplete  Resp Panel by RT-PCR (Flu A&B, Covid) Nasopharyngeal Swab     Status: None   Collection Time: 02/01/21  6:27 AM   Specimen: Nasopharyngeal Swab; Nasopharyngeal(NP) swabs in vial transport medium  Result Value Ref Range Status   SARS Coronavirus 2 by RT PCR NEGATIVE NEGATIVE Final    Comment: (NOTE) SARS-CoV-2 target nucleic acids are NOT DETECTED.  The SARS-CoV-2 RNA is generally detectable in upper  respiratory specimens during the acute phase of infection. The lowest concentration of SARS-CoV-2 viral copies this assay can detect is 138 copies/mL. A negative result does not preclude SARS-Cov-2 infection and should not be used as the sole basis for treatment or other patient management decisions. A negative result may occur with  improper specimen collection/handling, submission of specimen other than nasopharyngeal swab, presence of viral mutation(s) within the areas targeted by this assay, and inadequate number of viral copies(<138 copies/mL). A negative result must be combined with clinical observations, patient history, and epidemiological information. The expected result is Negative.  Fact Sheet for Patients:  EntrepreneurPulse.com.au  Fact Sheet for Healthcare Providers:  IncredibleEmployment.be  This test is no t yet approved or cleared by the [place] FDA and  has been authorized for detection and/or diagnosis of SARS-CoV-2 by FDA under an Emergency Use Authorization (EUA). This EUA will remain  in effect (meaning this test can be used) for the duration of the COVID-19 declaration under Section 564(b)(1) of the Act, 21 U.S.C.section 360bbb-3(b)(1), unless the authorization is terminated  or revoked sooner.       Influenza A by PCR NEGATIVE NEGATIVE Final   Influenza B by PCR NEGATIVE NEGATIVE Final    Comment: (NOTE) The Xpert Xpress SARS-CoV-2/FLU/RSV plus assay is intended as an aid in the diagnosis of influenza from Nasopharyngeal swab specimens and should not be used as a sole basis for treatment. Nasal washings and aspirates are unacceptable for Xpert Xpress SARS-CoV-2/FLU/RSV testing.  Fact Sheet for Patients: EntrepreneurPulse.com.au  Fact Sheet for Healthcare Providers: IncredibleEmployment.be  This test is not yet approved or cleared by the [place] FDA and has been  authorized for detection and/or diagnosis of SARS-CoV-2 by FDA under an Emergency Use Authorization (EUA). This EUA will remain in effect (meaning this test can be used) for the duration of the COVID-19 declaration under Section 564(b)(1) of the Act, 21 U.S.C. section 360bbb-3(b)(1), unless the authorization is terminated or revoked.  Performed at [facility], [address] [place], [place] [address]   Blood culture (single)     Status: None (Preliminary result)   Collection Time: 02/01/21  6:43 AM   Specimen: BLOOD  Result Value Ref Range Status   Specimen Description BLOOD RIGHT ANTECUBITAL  Final   Special Requests   Final    BOTTLES DRAWN AEROBIC AND ANAEROBIC Blood Culture adequate volume   Culture   Final    NO GROWTH 2 DAYS Performed at [facility], [address] [place], [place] [address]    Report Status PENDING  Incomplete  MRSA Next Gen by PCR, Nasal     Status: Abnormal   Collection Time: 02/02/21  8:52 AM   Specimen: Nasal Mucosa; Nasal Swab  Result Value Ref Range Status   MRSA by PCR Next Gen DETECTED (A) NOT  DETECTED Final    Comment: RESULT CALLED TO, READ BACK BY AND VERIFIED WITH: LYDIA ILLERBRUN 02/02/21 @ 1009 BY SB (NOTE) The GeneXpert MRSA Assay (FDA approved for NASAL specimens only), is one component of a comprehensive MRSA colonization surveillance program. It is not intended to diagnose MRSA infection nor to guide or monitor treatment for MRSA infections. Test performance is not FDA approved in patients less than 72 years old. Performed at [facility], [address] [place], [place] [address]     Coagulation Studies: No results for input(s): LABPROT, INR in the last 72 hours.  Urinalysis: No results for input(s): COLORURINE, LABSPEC, PHURINE, GLUCOSEU, HGBUR, BILIRUBINUR, KETONESUR, PROTEINUR, UROBILINOGEN, NITRITE, LEUKOCYTESUR in the last 72 hours.  Invalid input(s): APPERANCEUR    Imaging: No results  found.   Medications:      apixaban  5 mg Oral BID   Chlorhexidine Gluconate Cloth  6 each Topical Q0600   mupirocin ointment  1 application Nasal BID   patiromer  16.8 g Oral Daily   acetaminophen **OR** acetaminophen, HYDROcodone-acetaminophen, ondansetron **OR** ondansetron (ZOFRAN) IV  Assessment/ Plan:  Ms. .name is a 60 y.o. black female with  end stage renal disease on hemodialysis, atrial fibrillation, coronary artery disease status post CABG, congestive heart failure, CVA, who is admitted to [facility] on 02/01/2021 for Colitis [K52.9] Generalized abdominal pain [R10.84] Acute colitis [K52.9] Generalized weakness [R53.1] Non-intractable vomiting with nausea, unspecified vomiting type [R11.2]  [facility] MWF [place] Left AVF 71kg.   End stage renal disease on HD: with hyperkalemia. Emergent hemodialysis treatment today. 1K bath for the first hour.  - Continue MWF schedule, next treatment for Wednesday - Dialysis today, tolerated well. Temperature adjusted to manage hypotension.  - low potassium diet.  - Lokelma - Potassium 4.9 today  2. Anemia of chronic kidney disease Lab Results  Component Value Date   HGB 14.1 02/03/2021    Hgb above goal. No indication for ESA  3. Secondary Hyperparathyroidism: outpatient labs on 8/8 phos 4.6, ca 8.1 and PTH 228 - at goal.  - Currently holding sevelamer and cin with meals due to colitis. [place] Kitchen   4. Hypertension: holding home blood pressure medications. Home regimen of carvedilol, isosorbide mononitrate, Entresto.     LOS: 2   8/22/20221:44 PM

## 2021-02-03 NOTE — Discharge Summary (Signed)
Physician Discharge Summary   [name]  female DOB: [birth year]  RCV:[id number]  PCP: [name], MD  Admit date: 02/01/2021 Discharge date: 02/03/2021  Admitted From: home Disposition:  home CODE STATUS: Full code  Discharge Instructions     Discharge instructions   Complete by: As directed    You need Outpatient colonoscopy within 2-4 weeks with your GI doctor.   Dr. Enzo Bi [facility] Hospital Course:  For full details, please see H&P, progress notes, consult notes and ancillary notes.  Briefly,  [name] is a 60 y.o. female with medical history significant for ESRD on HD MWF, paroxysmal A. fib on Eliquis, HFrEF, CAD s/p CABG, who was sent from dialysis, with a complaint of generalized abdominal pain associated with nausea and vomiting.       Acute colitis - Patient with abdominal pain with CT abdomen and pelvis showing possible colitis, possible infiltrative mural mass --GI consulted and recommended supportive care. --diet was slowly advanced, and abdominal pain improved prior to discharge. --GI recommended outpatient colonoscopy within 2-4 weeks with pt's outpatient GI.   Elevated troponin - Slightly elevated troponin of 66 and 58, which is chronic, likely due to ESRD    CAD S/P CABG x 3 - resume statin after discharge     HFrEF (heart failure with reduced ejection fraction)  - Appears euvolemic --coreg resumed at discharge.  Delene Loll was noted as pt not taking.     ESRD (end stage renal disease) on dialysis ([place]) Hyperkalemia --Pt had emergent dialysis on 8/21 due to hyperkalemia >7.5 (was 5.6 the day before).  Pt admitted to dietary indiscretion, and said she loved to eat cantaloupe and apricot.   --cont Veltassa     Paroxysmal atrial fibrillation ([place]) - home coreg and amiodarone were not ordered on admission.  Both resumed at discharge. --cont Eliquis   Hypoglycemia iatrogenic  --BG dropped to 27 after insulin 10u +D50 ordered  for hyperglycemia.  Hypoglycemia resolved after extra D50 amp.   Discharge Diagnoses:  Active Problems:   S/P CABG x 3   HFrEF (heart failure with reduced ejection fraction) (HCC)   ESRD (end stage renal disease) on dialysis (HCC)   Paroxysmal atrial fibrillation (HCC)   Acute colitis   30 Day Unplanned Readmission Risk Score    Flowsheet Row ED to Hosp-Admission (Current) from 02/01/2021 in [place]  30 Day Unplanned Readmission Risk Score (%) 20.73 Filed at 02/03/2021 0801       This score is the patient's risk of an unplanned readmission within 30 days of being discharged (0 -100%). The score is based on dignosis, age, lab data, medications, orders, and past utilization.   Low:  0-14.9   Medium: 15-21.9   High: 22-29.9   Extreme: 30 and above         Discharge Instructions:  Allergies as of 02/03/2021       Reactions   Morphine And Related Hives   Levaquin [levofloxacin] Itching   Severe itching; prickly sensation   Other    Latex Rash   Tape Rash        Medication List     STOP taking these medications    lidocaine 5 % Commonly known as: Lidoderm   oxyCODONE-acetaminophen 5-325 MG tablet Commonly known as: PERCOCET/ROXICET       TAKE these medications    amiodarone 200 MG tablet Commonly known as: PACERONE Take 200  mg by mouth daily.   carvedilol 3.125 MG tablet Commonly known as: COREG Take 3.125 mg by mouth 2 (two) times daily. What changed: Another medication with the same name was removed. Continue taking this medication, and follow the directions you see here.   cinacalcet 90 MG tablet Commonly known as: SENSIPAR Take 90 mg by mouth daily.   Eliquis 5 MG Tabs tablet Generic drug: apixaban Per instructions TWICE DAILY  (route: oral)   Entresto 24-26 MG Generic drug: sacubitril-valsartan It's documented as "patient not taking". What changed: See the new instructions.   hydrOXYzine 50 MG  tablet Commonly known as: ATARAX/VISTARIL Take 50 mg by mouth 2 (two) times daily as needed for itching.   levothyroxine 75 MCG tablet Commonly known as: SYNTHROID Take 75 mcg by mouth daily before breakfast.   lidocaine-prilocaine cream Commonly known as: EMLA APPLY TO ACCESS SITE 30 MINUTES PRIOR TO DIALYSIS   midodrine 10 MG tablet Commonly known as: PROAMATINE Take 10-20 mg by mouth daily. (for low BP associated with dialysis)   pantoprazole 40 MG tablet Commonly known as: PROTONIX Take 40 mg by mouth daily.   ranolazine 500 MG 12 hr tablet Commonly known as: RANEXA Take 500 mg by mouth 2 (two) times daily.   rosuvastatin 20 MG tablet Commonly known as: CRESTOR Take 20 mg by mouth at bedtime.   sevelamer carbonate 800 MG tablet Commonly known as: RENVELA Take 2,400 mg by mouth 3 (three) times daily with meals. And 1 tablet with snacks   Veltassa 8.4 g packet Generic drug: patiromer Take 1 packet by mouth daily.   Zofran 4 MG tablet Generic drug: ondansetron Take 4 mg by mouth every 4 (four) hours as needed for nausea/vomiting.         Follow-up Information     [name], MD Follow up in 1 week(s).   Specialty: Internal Medicine Contact information: [place] [address] [phone]         Ok Edwards, NP Follow up in 2 week(s).   Specialty: Gastroenterology Why: need colonoscopy Contact information: [place] [address] [phone]                 Allergies  Allergen Reactions   Morphine And Related Hives   Levaquin [Levofloxacin] Itching    Severe itching; prickly sensation   Other    Latex Rash   Tape Rash     The results of significant diagnostics from this hospitalization (including imaging, microbiology, ancillary and laboratory) are listed below for reference.   Consultations:   Procedures/Studies: CT Abdomen Pelvis W  Contrast  Result Date: 02/01/2021 CLINICAL DATA:  Nausea, vomiting, right lower quadrant abdominal pain EXAM: CT ABDOMEN AND PELVIS WITH CONTRAST TECHNIQUE: Multidetector CT imaging of the abdomen and pelvis was performed using the standard protocol following bolus administration of intravenous contrast. CONTRAST:  75 cc Omnipaque 350 COMPARISON:  None. FINDINGS: Lower chest: Mild bibasilar atelectasis. Moderate cardiomegaly. Extensive coronary artery calcification Hepatobiliary: Cholelithiasis without pericholecystic inflammatory change. No intra or extrahepatic biliary ductal dilation. Liver unremarkable. Pancreas: Unremarkable Spleen: Unremarkable Adrenals/Urinary Tract: The adrenal glands are unremarkable. Marked bilateral renal cortical atrophy is present in keeping with changes of chronic renal failure. The bladder is decompressed and is unremarkable. Stomach/Bowel: The stomach and small bowel are unremarkable. There is mild circumferential bowel wall thickening involving the cecum with mild pericolonic soft tissue infiltration noted, best seen on image # 57/2. There is  mild infiltration of the adjacent omental fat. In the acute setting, this may represent a focal infectious or inflammatory colitis with adjacent inflammatory change. Alternatively, this may represent a a focal mural mass with transmural infiltration. The terminal is unremarkable. The adjacent appendix is well visualized and is normal. There is no evidence of obstruction or perforation. No free intraperitoneal gas or fluid Vascular/Lymphatic: There is extensive aortoiliac atherosclerotic calcification. No aortic aneurysm. No pathologic adenopathy within the abdomen and pelvis. Reproductive: Multiple calcified uterine fibroids are again identified. The pelvic organs are otherwise unremarkable. Other: Tiny fat containing umbilical hernia.  Rectum unremarkable. Musculoskeletal: No acute bone abnormality. No lytic or blastic bone lesions. Diffuse  sclerosis of the osseous structures is in keeping with renal osteodystrophy. IMPRESSION: Mild focal cecal bowel wall thickening and pericolonic soft tissue infiltration. In the acute setting, this may represent a focal infectious or inflammatory colitis in adjacent inflammatory change. However, an infiltrative mural mass could appear similarly with pericolonic changes representing transmural extension of disease. Correlation with recent screening endoscopy is recommended. No evidence of obstruction or focal inflammation. Cholelithiasis. Changes in keeping with chronic renal failure. In this setting, diffuse osseous sclerosis may reflect changes of renal osteodystrophy. Extensive coronary artery calcification.  Moderate cardiomegaly. Aortic Atherosclerosis (ICD10-I70.0). Electronically Signed   By: Fidela Salisbury M.D.   On: 02/01/2021 03:39   DG Chest Port 1 View  Result Date: 02/01/2021 CLINICAL DATA:  Chest pain. EXAM: PORTABLE CHEST 1 VIEW COMPARISON:  Chest radiograph dated 09/30/2020. FINDINGS: Cardiomegaly with mild vascular congestion. No focal consolidation, pleural effusion, or pneumothorax. Median sternotomy wires and coronary stent. No acute osseous pathology. Vascular stent in the left upper extremity. IMPRESSION: Cardiomegaly with mild vascular congestion. No focal consolidation. Electronically Signed   By: Anner Crete M.D.   On: 02/01/2021 02:41      Labs: BNP (last 3 results) No results for input(s): BNP in the last 8760 hours. Basic Metabolic Panel: Recent Labs  Lab 01/31/21 2139 02/02/21 0429 02/02/21 1035 02/03/21 0428  NA 136 132*  --  136  K 5.6* >7.5* 3.7 4.9  CL 96* 94*  --  95*  CO2 27 25  --  31  GLUCOSE 90 60*  --  89  BUN 36* 59*  --  45*  CREATININE 6.29* 8.75*  --  6.70*  CALCIUM 9.2 9.2  --  9.1  MG  --  2.3  --  2.2   Liver Function Tests: No results for input(s): AST, ALT, ALKPHOS, BILITOT, PROT, ALBUMIN in the last 168 hours. Recent Labs  Lab  02/01/21 1107  LIPASE 36   No results for input(s): AMMONIA in the last 168 hours. CBC: Recent Labs  Lab 01/31/21 2139 02/02/21 0429 02/03/21 0428  WBC 7.5 8.1 6.6  HGB 16.3* 14.5 14.1  HCT 48.0* 44.2 42.6  MCV 103.7* 101.1* 103.6*  PLT 118* 109* 113*   Cardiac Enzymes: No results for input(s): CKTOTAL, CKMB, CKMBINDEX, TROPONINI in the last 168 hours. BNP: Invalid input(s): POCBNP CBG: Recent Labs  Lab 02/02/21 1426 02/02/21 1801 02/02/21 2354 02/03/21 0531 02/03/21 0748  GLUCAP 72 125* 102* 123* 98   D-Dimer No results for input(s): DDIMER in the last 72 hours. Hgb A1c No results for input(s): HGBA1C in the last 72 hours. Lipid Profile No results for input(s): CHOL, HDL, LDLCALC, TRIG, CHOLHDL, LDLDIRECT in the last 72 hours. Thyroid function studies No results for input(s): TSH, T4TOTAL, T3FREE, THYROIDAB in the last 72 hours.  Invalid input(s):  FREET3 Anemia work up No results for input(s): VITAMINB12, FOLATE, FERRITIN, TIBC, IRON, RETICCTPCT in the last 72 hours. Urinalysis    Component Value Date/Time   COLORURINE Yellow 09/14/2012 0414   APPEARANCEUR Cloudy 09/14/2012 0414   LABSPEC 1.018 09/14/2012 0414   PHURINE 9.0 09/14/2012 0414   GLUCOSEU 50 mg/dL 09/14/2012 0414   HGBUR 1+ 09/14/2012 0414   BILIRUBINUR Negative 09/14/2012 0414   KETONESUR Negative 09/14/2012 0414   PROTEINUR >=500 09/14/2012 0414   NITRITE Negative 09/14/2012 0414   LEUKOCYTESUR 2+ 09/14/2012 0414   Sepsis Labs Invalid input(s): PROCALCITONIN,  WBC,  LACTICIDVEN Microbiology Recent Results (from the past 240 hour(s))  Culture, blood (routine x 2)     Status: None (Preliminary result)   Collection Time: 02/01/21  2:20 AM   Specimen: BLOOD  Result Value Ref Range Status   Specimen Description BLOOD RIGHT WRIST  Final   Special Requests   Final    BOTTLES DRAWN AEROBIC AND ANAEROBIC Blood Culture results may not be optimal due to an inadequate volume of blood received in  culture bottles   Culture   Final    NO GROWTH 2 DAYS Performed at [facility], [address], [place], [place] [address]    Report Status PENDING  Incomplete  Resp Panel by RT-PCR (Flu A&B, Covid) Nasopharyngeal Swab     Status: None   Collection Time: 02/01/21  6:27 AM   Specimen: Nasopharyngeal Swab; Nasopharyngeal(NP) swabs in vial transport medium  Result Value Ref Range Status   SARS Coronavirus 2 by RT PCR NEGATIVE NEGATIVE Final    Comment: (NOTE) SARS-CoV-2 target nucleic acids are NOT DETECTED.  The SARS-CoV-2 RNA is generally detectable in upper respiratory specimens during the acute phase of infection. The lowest concentration of SARS-CoV-2 viral copies this assay can detect is 138 copies/mL. A negative result does not preclude SARS-Cov-2 infection and should not be used as the sole basis for treatment or other patient management decisions. A negative result may occur with  improper specimen collection/handling, submission of specimen other than nasopharyngeal swab, presence of viral mutation(s) within the areas targeted by this assay, and inadequate number of viral copies(<138 copies/mL). A negative result must be combined with clinical observations, patient history, and epidemiological information. The expected result is Negative.  Fact Sheet for Patients:  EntrepreneurPulse.com.au  Fact Sheet for Healthcare Providers:  IncredibleEmployment.be  This test is no t yet approved or cleared by the [place] FDA and  has been authorized for detection and/or diagnosis of SARS-CoV-2 by FDA under an Emergency Use Authorization (EUA). This EUA will remain  in effect (meaning this test can be used) for the duration of the COVID-19 declaration under Section 564(b)(1) of the Act, 21 U.S.C.section 360bbb-3(b)(1), unless the authorization is terminated  or revoked sooner.       Influenza A by PCR NEGATIVE NEGATIVE Final    Influenza B by PCR NEGATIVE NEGATIVE Final    Comment: (NOTE) The Xpert Xpress SARS-CoV-2/FLU/RSV plus assay is intended as an aid in the diagnosis of influenza from Nasopharyngeal swab specimens and should not be used as a sole basis for treatment. Nasal washings and aspirates are unacceptable for Xpert Xpress SARS-CoV-2/FLU/RSV testing.  Fact Sheet for Patients: EntrepreneurPulse.com.au  Fact Sheet for Healthcare Providers: IncredibleEmployment.be  This test is not yet approved or cleared by the [place] FDA and has been authorized for detection and/or diagnosis of SARS-CoV-2 by FDA under an Emergency Use Authorization (EUA). This EUA will remain in  effect (meaning this test can be used) for the duration of the COVID-19 declaration under Section 564(b)(1) of the Act, 21 U.S.C. section 360bbb-3(b)(1), unless the authorization is terminated or revoked.  Performed at [facility], [address] [place], [place] [address]   Blood culture (single)     Status: None (Preliminary result)   Collection Time: 02/01/21  6:43 AM   Specimen: BLOOD  Result Value Ref Range Status   Specimen Description BLOOD RIGHT ANTECUBITAL  Final   Special Requests   Final    BOTTLES DRAWN AEROBIC AND ANAEROBIC Blood Culture adequate volume   Culture   Final    NO GROWTH 2 DAYS Performed at [facility], [address] [place], [place] [address]    Report Status PENDING  Incomplete  MRSA Next Gen by PCR, Nasal     Status: Abnormal   Collection Time: 02/02/21  8:52 AM   Specimen: Nasal Mucosa; Nasal Swab  Result Value Ref Range Status   MRSA by PCR Next Gen DETECTED (A) NOT DETECTED Final    Comment: RESULT CALLED TO, READ BACK BY AND VERIFIED WITH: LYDIA ILLERBRUN 02/02/21 @ 1009 BY SB (NOTE) The GeneXpert MRSA Assay (FDA approved for NASAL specimens only), is one component of a comprehensive MRSA colonization surveillance program. It  is not intended to diagnose MRSA infection nor to guide or monitor treatment for MRSA infections. Test performance is not FDA approved in patients less than 89 years old. Performed at [facility], [address] [place], [place] [address]      Total time spend on discharging this patient, including the last patient exam, discussing the hospital stay, instructions for ongoing care as it relates to all pertinent caregivers, as well as preparing the medical discharge records, prescriptions, and/or referrals as applicable, is 40 minutes.    Enzo Bi, MD  [facility] 02/03/2021, 9:51 AM

## 2021-02-03 NOTE — Progress Notes (Signed)
Tx completed, successfully pulled of 1kg. Pt held needle sight with no postponed bleeding.

## 2021-02-04 ENCOUNTER — Encounter (INDEPENDENT_AMBULATORY_CARE_PROVIDER_SITE_OTHER): Payer: Medicare Other

## 2021-02-04 ENCOUNTER — Ambulatory Visit (INDEPENDENT_AMBULATORY_CARE_PROVIDER_SITE_OTHER): Payer: Medicare Other | Admitting: Vascular Surgery

## 2021-02-05 DIAGNOSIS — N186 End stage renal disease: Secondary | ICD-10-CM | POA: Diagnosis not present

## 2021-02-05 DIAGNOSIS — Z992 Dependence on renal dialysis: Secondary | ICD-10-CM | POA: Diagnosis not present

## 2021-02-06 LAB — CULTURE, BLOOD (SINGLE)
Culture: NO GROWTH
Special Requests: ADEQUATE

## 2021-02-06 LAB — CULTURE, BLOOD (ROUTINE X 2): Culture: NO GROWTH

## 2021-02-06 IMAGING — CR DG ABDOMEN ACUTE W/ 1V CHEST
4 series · 4 of 4 positions shown · non-contrast
Comparison: Abdominal radiograph 01/13/2016, chest radiograph
01/18/2019

CLINICAL DATA: Bloated, no bowel movement, nausea, CHF,
hypertension, coronary artery disease post MI

EXAM:
DG ABDOMEN ACUTE W/ 1V CHEST

[abdomen kub (1 of 2)]
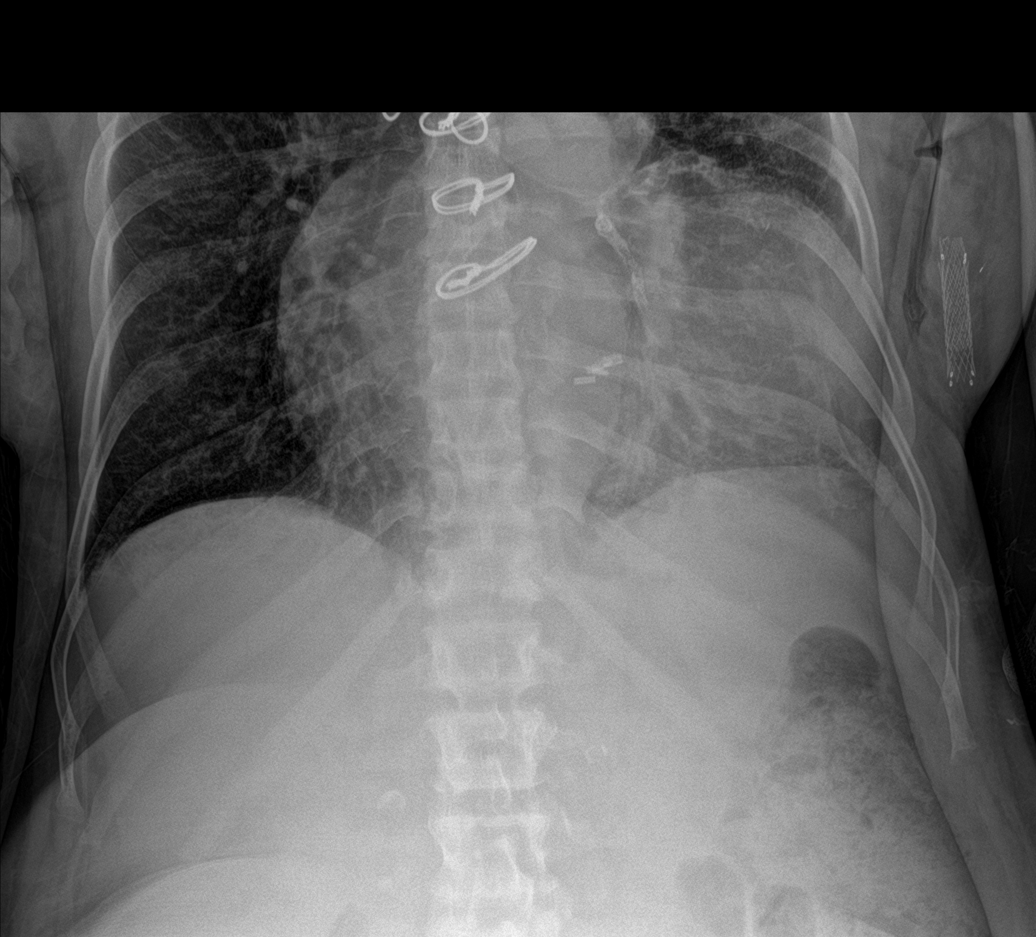

[abdomen kub (2 of 2)]
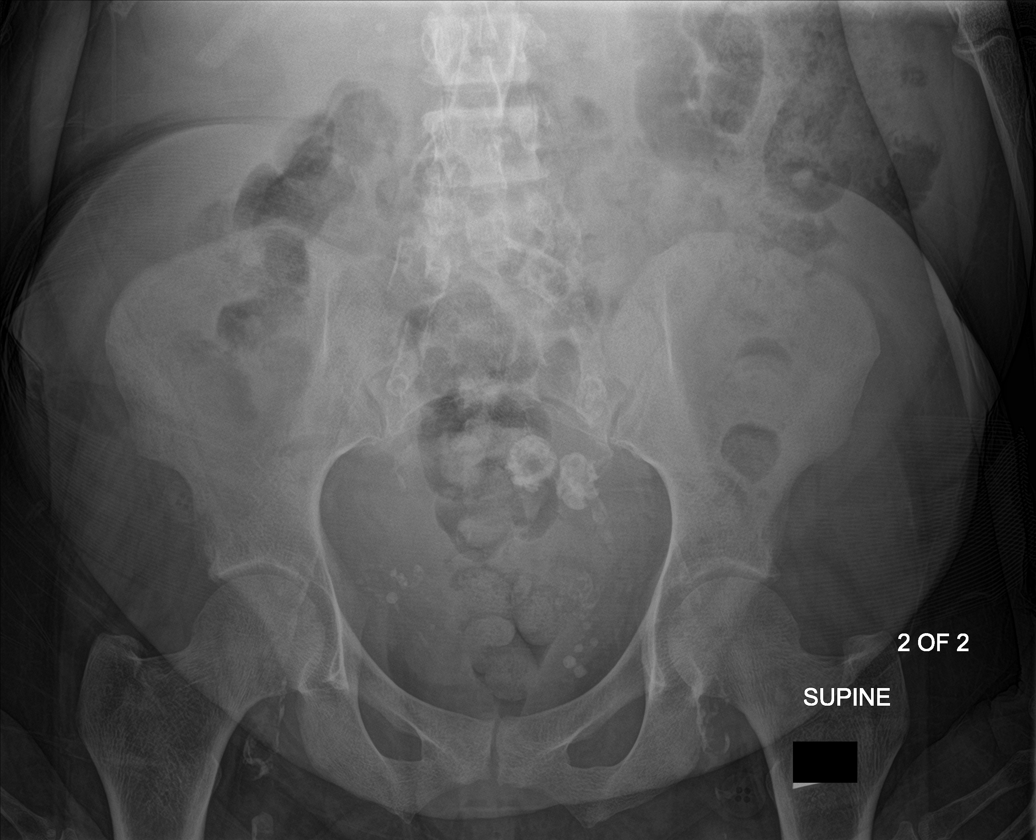

[chest pa]
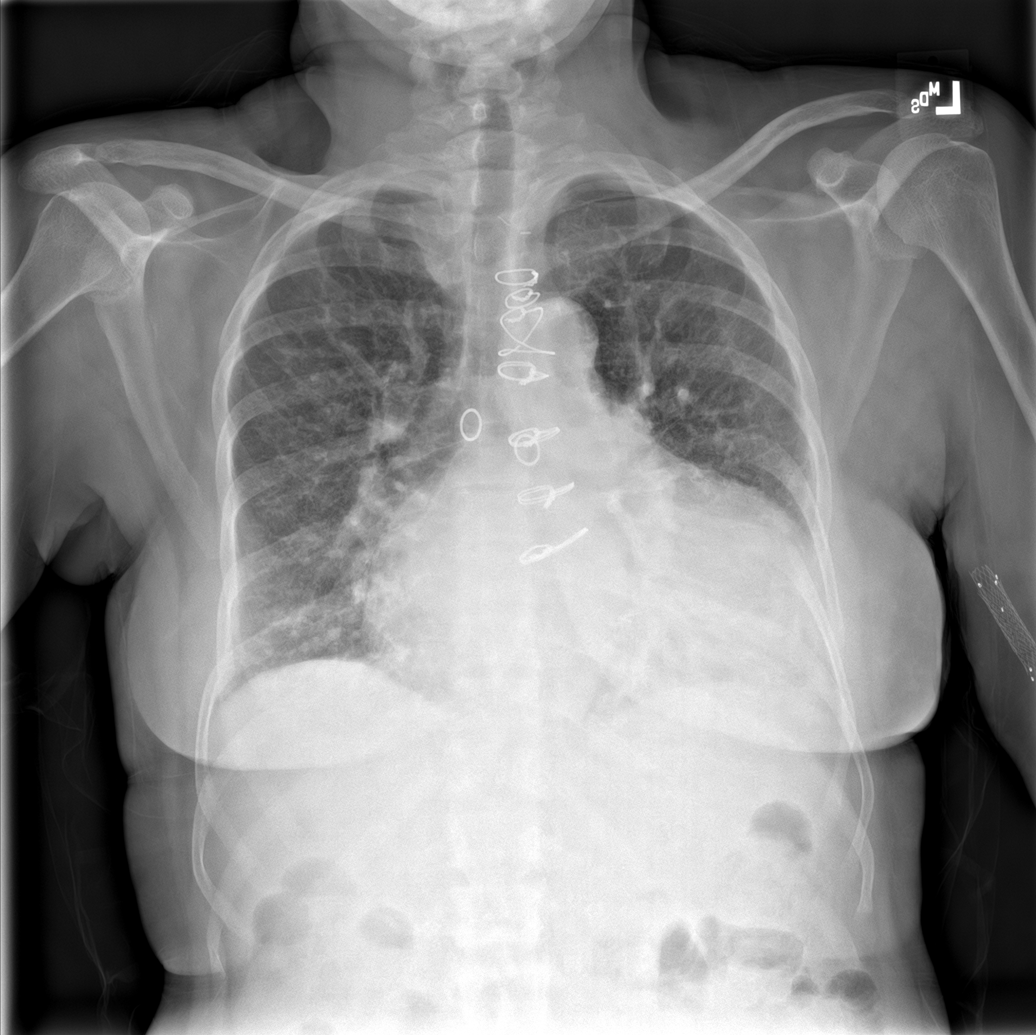

[abdomen erect]
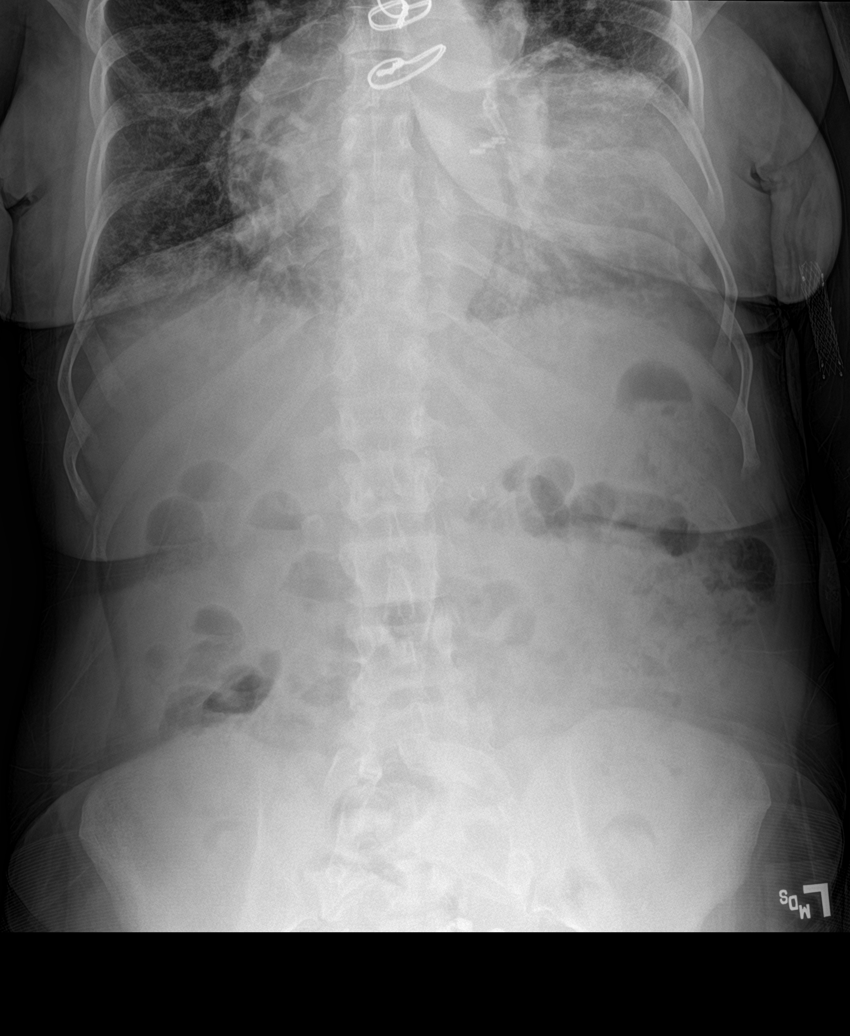

[4 of 4 positions shown; findings below may reference images not displayed]

FINDINGS: Enlargement of cardiac silhouette post CABG with pulmonary vascular
congestion.

Atherosclerotic calcifications of coronary arteries.

Mediastinal contours normal.

Chronic accentuation of perihilar markings, slightly improved.

No definite acute infiltrate, pleural effusion or pneumothorax.

Normal bowel gas pattern.

No bowel dilatation, bowel wall thickening or free air.

Atherosclerotic calcifications, phleboliths, and calcified
leiomyomata within pelvis.

No acute osseous findings.

Questionable 5 mm RIGHT renal calculus.
IMPRESSION: Enlargement of cardiac silhouette with pulmonary vascular congestion
post CABG.

No definite acute infiltrates.

Nonobstructive bowel gas pattern.

Question 5 mm RIGHT renal calculus.

## 2021-02-07 DIAGNOSIS — Z992 Dependence on renal dialysis: Secondary | ICD-10-CM | POA: Diagnosis not present

## 2021-02-07 DIAGNOSIS — I10 Essential (primary) hypertension: Secondary | ICD-10-CM | POA: Diagnosis not present

## 2021-02-07 DIAGNOSIS — E782 Mixed hyperlipidemia: Secondary | ICD-10-CM | POA: Diagnosis not present

## 2021-02-07 DIAGNOSIS — R109 Unspecified abdominal pain: Secondary | ICD-10-CM | POA: Diagnosis not present

## 2021-02-07 DIAGNOSIS — N186 End stage renal disease: Secondary | ICD-10-CM | POA: Diagnosis not present

## 2021-02-10 DIAGNOSIS — Z992 Dependence on renal dialysis: Secondary | ICD-10-CM | POA: Diagnosis not present

## 2021-02-10 DIAGNOSIS — N186 End stage renal disease: Secondary | ICD-10-CM | POA: Diagnosis not present

## 2021-02-12 DIAGNOSIS — Z992 Dependence on renal dialysis: Secondary | ICD-10-CM | POA: Diagnosis not present

## 2021-02-12 DIAGNOSIS — N186 End stage renal disease: Secondary | ICD-10-CM | POA: Diagnosis not present

## 2021-02-12 DIAGNOSIS — E875 Hyperkalemia: Secondary | ICD-10-CM | POA: Diagnosis not present

## 2021-02-13 DIAGNOSIS — Z992 Dependence on renal dialysis: Secondary | ICD-10-CM | POA: Diagnosis not present

## 2021-02-13 DIAGNOSIS — N186 End stage renal disease: Secondary | ICD-10-CM | POA: Diagnosis not present

## 2021-02-14 DIAGNOSIS — E875 Hyperkalemia: Secondary | ICD-10-CM | POA: Diagnosis not present

## 2021-02-14 DIAGNOSIS — Z992 Dependence on renal dialysis: Secondary | ICD-10-CM | POA: Diagnosis not present

## 2021-02-14 DIAGNOSIS — N186 End stage renal disease: Secondary | ICD-10-CM | POA: Diagnosis not present

## 2021-02-18 DIAGNOSIS — Z992 Dependence on renal dialysis: Secondary | ICD-10-CM | POA: Diagnosis not present

## 2021-02-18 DIAGNOSIS — N186 End stage renal disease: Secondary | ICD-10-CM | POA: Diagnosis not present

## 2021-02-19 DIAGNOSIS — N186 End stage renal disease: Secondary | ICD-10-CM | POA: Diagnosis not present

## 2021-02-19 DIAGNOSIS — Z992 Dependence on renal dialysis: Secondary | ICD-10-CM | POA: Diagnosis not present

## 2021-02-21 DIAGNOSIS — N186 End stage renal disease: Secondary | ICD-10-CM | POA: Diagnosis not present

## 2021-02-21 DIAGNOSIS — Z992 Dependence on renal dialysis: Secondary | ICD-10-CM | POA: Diagnosis not present

## 2021-02-23 DIAGNOSIS — Z992 Dependence on renal dialysis: Secondary | ICD-10-CM | POA: Diagnosis not present

## 2021-02-23 DIAGNOSIS — N186 End stage renal disease: Secondary | ICD-10-CM | POA: Diagnosis not present

## 2021-02-23 DIAGNOSIS — I509 Heart failure, unspecified: Secondary | ICD-10-CM | POA: Diagnosis not present

## 2021-02-24 DIAGNOSIS — N186 End stage renal disease: Secondary | ICD-10-CM | POA: Diagnosis not present

## 2021-02-24 DIAGNOSIS — Z992 Dependence on renal dialysis: Secondary | ICD-10-CM | POA: Diagnosis not present

## 2021-02-24 DIAGNOSIS — E875 Hyperkalemia: Secondary | ICD-10-CM | POA: Diagnosis not present

## 2021-02-24 DIAGNOSIS — N2581 Secondary hyperparathyroidism of renal origin: Secondary | ICD-10-CM | POA: Diagnosis not present

## 2021-02-26 DIAGNOSIS — Z992 Dependence on renal dialysis: Secondary | ICD-10-CM | POA: Diagnosis not present

## 2021-02-26 DIAGNOSIS — N186 End stage renal disease: Secondary | ICD-10-CM | POA: Diagnosis not present

## 2021-02-28 DIAGNOSIS — N186 End stage renal disease: Secondary | ICD-10-CM | POA: Diagnosis not present

## 2021-02-28 DIAGNOSIS — Z992 Dependence on renal dialysis: Secondary | ICD-10-CM | POA: Diagnosis not present

## 2021-03-03 DIAGNOSIS — Z992 Dependence on renal dialysis: Secondary | ICD-10-CM | POA: Diagnosis not present

## 2021-03-03 DIAGNOSIS — N186 End stage renal disease: Secondary | ICD-10-CM | POA: Diagnosis not present

## 2021-03-03 DIAGNOSIS — E875 Hyperkalemia: Secondary | ICD-10-CM | POA: Diagnosis not present

## 2021-03-05 DIAGNOSIS — N186 End stage renal disease: Secondary | ICD-10-CM | POA: Diagnosis not present

## 2021-03-05 DIAGNOSIS — Z992 Dependence on renal dialysis: Secondary | ICD-10-CM | POA: Diagnosis not present

## 2021-03-07 DIAGNOSIS — Z992 Dependence on renal dialysis: Secondary | ICD-10-CM | POA: Diagnosis not present

## 2021-03-07 DIAGNOSIS — N186 End stage renal disease: Secondary | ICD-10-CM | POA: Diagnosis not present

## 2021-03-10 DIAGNOSIS — Z992 Dependence on renal dialysis: Secondary | ICD-10-CM | POA: Diagnosis not present

## 2021-03-10 DIAGNOSIS — N186 End stage renal disease: Secondary | ICD-10-CM | POA: Diagnosis not present

## 2021-03-11 ENCOUNTER — Emergency Department: Payer: Medicare Other

## 2021-03-11 ENCOUNTER — Other Ambulatory Visit: Payer: Self-pay

## 2021-03-11 DIAGNOSIS — Z7901 Long term (current) use of anticoagulants: Secondary | ICD-10-CM | POA: Diagnosis not present

## 2021-03-11 DIAGNOSIS — Z8673 Personal history of transient ischemic attack (TIA), and cerebral infarction without residual deficits: Secondary | ICD-10-CM | POA: Diagnosis not present

## 2021-03-11 DIAGNOSIS — I2510 Atherosclerotic heart disease of native coronary artery without angina pectoris: Secondary | ICD-10-CM | POA: Diagnosis not present

## 2021-03-11 DIAGNOSIS — J811 Chronic pulmonary edema: Secondary | ICD-10-CM | POA: Diagnosis not present

## 2021-03-11 DIAGNOSIS — R079 Chest pain, unspecified: Secondary | ICD-10-CM | POA: Diagnosis not present

## 2021-03-11 DIAGNOSIS — I132 Hypertensive heart and chronic kidney disease with heart failure and with stage 5 chronic kidney disease, or end stage renal disease: Secondary | ICD-10-CM | POA: Insufficient documentation

## 2021-03-11 DIAGNOSIS — D631 Anemia in chronic kidney disease: Secondary | ICD-10-CM | POA: Insufficient documentation

## 2021-03-11 DIAGNOSIS — Z743 Need for continuous supervision: Secondary | ICD-10-CM | POA: Diagnosis not present

## 2021-03-11 DIAGNOSIS — R61 Generalized hyperhidrosis: Secondary | ICD-10-CM | POA: Diagnosis not present

## 2021-03-11 DIAGNOSIS — I480 Paroxysmal atrial fibrillation: Secondary | ICD-10-CM | POA: Insufficient documentation

## 2021-03-11 DIAGNOSIS — Z951 Presence of aortocoronary bypass graft: Secondary | ICD-10-CM | POA: Insufficient documentation

## 2021-03-11 DIAGNOSIS — Z79899 Other long term (current) drug therapy: Secondary | ICD-10-CM | POA: Diagnosis not present

## 2021-03-11 DIAGNOSIS — E039 Hypothyroidism, unspecified: Secondary | ICD-10-CM | POA: Diagnosis not present

## 2021-03-11 DIAGNOSIS — R7401 Elevation of levels of liver transaminase levels: Secondary | ICD-10-CM | POA: Insufficient documentation

## 2021-03-11 DIAGNOSIS — I5030 Unspecified diastolic (congestive) heart failure: Secondary | ICD-10-CM | POA: Diagnosis not present

## 2021-03-11 DIAGNOSIS — F17210 Nicotine dependence, cigarettes, uncomplicated: Secondary | ICD-10-CM | POA: Diagnosis not present

## 2021-03-11 DIAGNOSIS — G4489 Other headache syndrome: Secondary | ICD-10-CM | POA: Diagnosis not present

## 2021-03-11 DIAGNOSIS — Z20822 Contact with and (suspected) exposure to covid-19: Secondary | ICD-10-CM | POA: Insufficient documentation

## 2021-03-11 DIAGNOSIS — R519 Headache, unspecified: Secondary | ICD-10-CM | POA: Diagnosis not present

## 2021-03-11 DIAGNOSIS — E875 Hyperkalemia: Secondary | ICD-10-CM | POA: Insufficient documentation

## 2021-03-11 DIAGNOSIS — N186 End stage renal disease: Secondary | ICD-10-CM | POA: Insufficient documentation

## 2021-03-11 DIAGNOSIS — I517 Cardiomegaly: Secondary | ICD-10-CM | POA: Diagnosis not present

## 2021-03-11 DIAGNOSIS — R778 Other specified abnormalities of plasma proteins: Secondary | ICD-10-CM | POA: Diagnosis not present

## 2021-03-11 DIAGNOSIS — R0789 Other chest pain: Secondary | ICD-10-CM | POA: Diagnosis not present

## 2021-03-11 LAB — CBC WITH DIFFERENTIAL/PLATELET
Abs Immature Granulocytes: 0.02 10*3/uL (ref 0.00–0.07)
Basophils Absolute: 0 10*3/uL (ref 0.0–0.1)
Basophils Relative: 0 %
Eosinophils Absolute: 0 10*3/uL (ref 0.0–0.5)
Eosinophils Relative: 1 %
HCT: 45.5 % (ref 36.0–46.0)
Hemoglobin: 15.1 g/dL — ABNORMAL HIGH (ref 12.0–15.0)
Immature Granulocytes: 0 %
Lymphocytes Relative: 17 %
Lymphs Abs: 0.9 10*3/uL (ref 0.7–4.0)
MCH: 34.7 pg — ABNORMAL HIGH (ref 26.0–34.0)
MCHC: 33.2 g/dL (ref 30.0–36.0)
MCV: 104.6 fL — ABNORMAL HIGH (ref 80.0–100.0)
Monocytes Absolute: 0.4 10*3/uL (ref 0.1–1.0)
Monocytes Relative: 7 %
Neutro Abs: 4 10*3/uL (ref 1.7–7.7)
Neutrophils Relative %: 75 %
Platelets: 136 10*3/uL — ABNORMAL LOW (ref 150–400)
RBC: 4.35 MIL/uL (ref 3.87–5.11)
RDW: 14.7 % (ref 11.5–15.5)
WBC: 5.3 10*3/uL (ref 4.0–10.5)
nRBC: 0 % (ref 0.0–0.2)

## 2021-03-11 LAB — COMPREHENSIVE METABOLIC PANEL
ALT: 16 U/L (ref 0–44)
AST: 23 U/L (ref 15–41)
Albumin: 4.4 g/dL (ref 3.5–5.0)
Alkaline Phosphatase: 135 U/L — ABNORMAL HIGH (ref 38–126)
Anion gap: 14 (ref 5–15)
BUN: 39 mg/dL — ABNORMAL HIGH (ref 6–20)
CO2: 29 mmol/L (ref 22–32)
Calcium: 9.1 mg/dL (ref 8.9–10.3)
Chloride: 93 mmol/L — ABNORMAL LOW (ref 98–111)
Creatinine, Ser: 8.43 mg/dL — ABNORMAL HIGH (ref 0.44–1.00)
GFR, Estimated: 5 mL/min — ABNORMAL LOW (ref >60)
Glucose, Bld: 107 mg/dL — ABNORMAL HIGH (ref 70–99)
Potassium: 6 mmol/L — ABNORMAL HIGH (ref 3.5–5.1)
Sodium: 136 mmol/L (ref 135–145)
Total Bilirubin: 1 mg/dL (ref 0.3–1.2)
Total Protein: 8.8 g/dL — ABNORMAL HIGH (ref 6.5–8.1)

## 2021-03-11 LAB — TROPONIN I (HIGH SENSITIVITY)
Troponin I (High Sensitivity): 56 ng/L — ABNORMAL HIGH (ref <18)
Troponin I (High Sensitivity): 65 ng/L — ABNORMAL HIGH (ref <18)

## 2021-03-11 LAB — LIPASE, BLOOD: Lipase: 47 U/L (ref 11–51)

## 2021-03-11 NOTE — ED Provider Notes (Signed)
Emergency Medicine Provider Triage Evaluation Note  [name] , a 60 y.o. female  was evaluated in triage.  Pt complains of headache and intermittent chest pain.  Patient reports that she is primarily concerned with headache.  She states that headache involves the bilateral temples and came on slowly and progressed in intensity gradually.  Patient also states that she has had a fasciculation of her left cheek.  Patient has had a prior CVA and became concerned.  She also states that she has had some intermittent chest discomfort but states that she does not currently have any chest pain.  No chest tightness or shortness of breath.  She is currently taking Eliquis.  No nausea, vomiting or abdominal pain.  No falls or other mechanisms of trauma.  Patient states that she last had dialysis yesterday.   Review of Systems  Positive: Patient has headache and chest pain Negative: No dizziness, chest tightness, shortness of breath, nausea, vomiting or abdominal pain.  Physical Exam  BP (!) 129/96 (BP Location: Right Arm)   Pulse 79   Temp 98.7 F (37.1 C) (Oral)   Resp 18   SpO2 99%  Gen:   Awake, no distress   Resp:  Normal effort.  No adventitious lung sounds auscultated. MSK:   Symmetric strength in upper and lower extremities. Other:  Patient points out fasciculations of left cheek but when patient is providing historical information, fasciculations stopped.  Medical Decision Making  Medically screening exam initiated at 6:07 PM.  Appropriate orders placed.  [name] was informed that the remainder of the evaluation will be completed by another provider, this initial triage assessment does not replace that evaluation, and the importance of remaining in the ED until their evaluation is complete.  Will obtain basic labs.  Will obtain troponin, EKG and chest x-ray.  Will obtain CT head without contrast and will reassess.   [name] [place], PA-C 03/11/21 Dorthula Perfect,  MD 03/11/21 2242

## 2021-03-11 NOTE — ED Triage Notes (Signed)
Pt to ED via EMS with a of chest pain and mouth twitching. She is a dialysis pt. VSS.

## 2021-03-11 NOTE — ED Triage Notes (Signed)
Pt presents to ED with c/o of chest pain and headache and L sided mouth twitching. Pt states brother suggested pt comes but pt does not have any complaints. Pt is A&Ox4.   Pt has HX of dialysis and stroke.

## 2021-03-12 ENCOUNTER — Other Ambulatory Visit: Payer: Self-pay

## 2021-03-12 ENCOUNTER — Observation Stay
Admission: EM | Admit: 2021-03-12 | Discharge: 2021-03-13 | Disposition: A | Discharge status: Home / Self Care | Payer: Medicare Other | Attending: Hospitalist | Admitting: Hospitalist

## 2021-03-12 DIAGNOSIS — I2510 Atherosclerotic heart disease of native coronary artery without angina pectoris: Secondary | ICD-10-CM | POA: Diagnosis present

## 2021-03-12 DIAGNOSIS — I480 Paroxysmal atrial fibrillation: Secondary | ICD-10-CM | POA: Diagnosis present

## 2021-03-12 DIAGNOSIS — R778 Other specified abnormalities of plasma proteins: Secondary | ICD-10-CM | POA: Diagnosis not present

## 2021-03-12 DIAGNOSIS — D638 Anemia in other chronic diseases classified elsewhere: Secondary | ICD-10-CM | POA: Diagnosis present

## 2021-03-12 DIAGNOSIS — Z8673 Personal history of transient ischemic attack (TIA), and cerebral infarction without residual deficits: Secondary | ICD-10-CM

## 2021-03-12 DIAGNOSIS — R079 Chest pain, unspecified: Secondary | ICD-10-CM | POA: Diagnosis present

## 2021-03-12 DIAGNOSIS — E875 Hyperkalemia: Secondary | ICD-10-CM | POA: Diagnosis present

## 2021-03-12 DIAGNOSIS — R519 Headache, unspecified: Secondary | ICD-10-CM

## 2021-03-12 DIAGNOSIS — N2581 Secondary hyperparathyroidism of renal origin: Secondary | ICD-10-CM | POA: Diagnosis not present

## 2021-03-12 DIAGNOSIS — D631 Anemia in chronic kidney disease: Secondary | ICD-10-CM | POA: Diagnosis not present

## 2021-03-12 DIAGNOSIS — R0789 Other chest pain: Secondary | ICD-10-CM | POA: Diagnosis not present

## 2021-03-12 DIAGNOSIS — E039 Hypothyroidism, unspecified: Secondary | ICD-10-CM | POA: Diagnosis present

## 2021-03-12 DIAGNOSIS — N186 End stage renal disease: Secondary | ICD-10-CM | POA: Diagnosis not present

## 2021-03-12 DIAGNOSIS — I5020 Unspecified systolic (congestive) heart failure: Secondary | ICD-10-CM | POA: Diagnosis present

## 2021-03-12 DIAGNOSIS — Z951 Presence of aortocoronary bypass graft: Secondary | ICD-10-CM

## 2021-03-12 LAB — TROPONIN I (HIGH SENSITIVITY): Troponin I (High Sensitivity): 80 ng/L — ABNORMAL HIGH (ref <18)

## 2021-03-12 LAB — RESP PANEL BY RT-PCR (FLU A&B, COVID) ARPGX2
Influenza A by PCR: NEGATIVE
Influenza B by PCR: NEGATIVE
SARS Coronavirus 2 by RT PCR: NEGATIVE

## 2021-03-12 LAB — HEPATITIS B SURFACE ANTIGEN: Hepatitis B Surface Ag: NONREACTIVE

## 2021-03-12 LAB — POTASSIUM: Potassium: 5.7 mmol/L — ABNORMAL HIGH (ref 3.5–5.1)

## 2021-03-12 MED ORDER — APIXABAN 5 MG PO TABS
5.0000 mg | ORAL_TABLET | Freq: Two times a day (BID) | ORAL | Status: DC
  Administered 2021-03-12 – 2021-03-13 (×2): 5 mg via ORAL
  Filled 2021-03-12 (×2): qty 1

## 2021-03-12 MED ORDER — ACETAMINOPHEN 325 MG PO TABS: 650.0000 mg | ORAL_TABLET | ORAL | Status: DC | PRN

## 2021-03-12 MED ORDER — LEVOTHYROXINE SODIUM 50 MCG PO TABS
75.0000 ug | ORAL_TABLET | Freq: Every day | ORAL | Status: DC
  Administered 2021-03-13: 75 ug via ORAL
  Filled 2021-03-12: qty 1

## 2021-03-12 MED ORDER — RANOLAZINE ER 500 MG PO TB12
1000.0000 mg | ORAL_TABLET | Freq: Two times a day (BID) | ORAL | Status: DC
  Administered 2021-03-13: 1000 mg via ORAL
  Filled 2021-03-12 (×2): qty 2

## 2021-03-12 MED ORDER — ASPIRIN 81 MG PO CHEW
324.0000 mg | CHEWABLE_TABLET | Freq: Once | ORAL | Status: AC
  Administered 2021-03-12: 324 mg via ORAL
  Filled 2021-03-12: qty 4

## 2021-03-12 MED ORDER — OXYCODONE HCL 5 MG PO TABS
5.0000 mg | ORAL_TABLET | Freq: Once | ORAL | Status: AC
Start: 2021-03-12 — End: 2021-03-12
  Administered 2021-03-12: 5 mg via ORAL
  Filled 2021-03-12: qty 1

## 2021-03-12 MED ORDER — ONDANSETRON 4 MG PO TBDP
4.0000 mg | ORAL_TABLET | Freq: Once | ORAL | Status: AC
  Administered 2021-03-12: 4 mg via ORAL
  Filled 2021-03-12: qty 1

## 2021-03-12 MED ORDER — AMIODARONE HCL 200 MG PO TABS
200.0000 mg | ORAL_TABLET | Freq: Every day | ORAL | Status: DC
  Administered 2021-03-13: 200 mg via ORAL
  Filled 2021-03-12 (×2): qty 1

## 2021-03-12 MED ORDER — LIDOCAINE-PRILOCAINE 2.5-2.5 % EX CREA
TOPICAL_CREAM | Freq: Once | CUTANEOUS | Status: AC
  Filled 2021-03-12: qty 5

## 2021-03-12 MED ORDER — ROSUVASTATIN CALCIUM 20 MG PO TABS
20.0000 mg | ORAL_TABLET | Freq: Every day | ORAL | Status: DC
  Administered 2021-03-12: 20 mg via ORAL
  Filled 2021-03-12 (×2): qty 1

## 2021-03-12 MED ORDER — PANTOPRAZOLE SODIUM 40 MG PO TBEC
40.0000 mg | DELAYED_RELEASE_TABLET | Freq: Every day | ORAL | Status: DC
  Administered 2021-03-13: 40 mg via ORAL
  Filled 2021-03-12: qty 1

## 2021-03-12 MED ORDER — SACUBITRIL-VALSARTAN 24-26 MG PO TABS: 1.0000 | ORAL_TABLET | Freq: Two times a day (BID) | ORAL | Status: DC

## 2021-03-12 MED ORDER — MIDODRINE HCL 5 MG PO TABS: 10.0000 mg | ORAL_TABLET | Freq: Every day | ORAL | Status: DC | PRN

## 2021-03-12 MED ORDER — OXYCODONE-ACETAMINOPHEN 5-325 MG PO TABS: 1.0000 | ORAL_TABLET | Freq: Two times a day (BID) | ORAL | Status: DC | PRN

## 2021-03-12 MED ORDER — LIDOCAINE-PRILOCAINE 2.5-2.5 % EX CREA: 1.0000 "application " | TOPICAL_CREAM | CUTANEOUS | Status: DC | PRN

## 2021-03-12 MED ORDER — CARVEDILOL 3.125 MG PO TABS
3.1250 mg | ORAL_TABLET | Freq: Two times a day (BID) | ORAL | Status: DC
  Administered 2021-03-12 – 2021-03-13 (×2): 3.125 mg via ORAL
  Filled 2021-03-12 (×2): qty 1

## 2021-03-12 MED ORDER — SODIUM CHLORIDE 0.9 % IV SOLN: 100.0000 mL | INTRAVENOUS | Status: DC | PRN

## 2021-03-12 MED ORDER — SODIUM ZIRCONIUM CYCLOSILICATE 10 G PO PACK
10.0000 g | PACK | Freq: Once | ORAL | Status: AC
  Administered 2021-03-12: 10 g via ORAL
  Filled 2021-03-12: qty 1

## 2021-03-12 MED ORDER — SEVELAMER CARBONATE 800 MG PO TABS
800.0000 mg | ORAL_TABLET | ORAL | Status: DC
  Filled 2021-03-12 (×5): qty 1

## 2021-03-12 MED ORDER — PATIROMER SORBITEX CALCIUM 8.4 G PO PACK
1.0000 | PACK | Freq: Every day | ORAL | Status: DC
  Administered 2021-03-13: 1 via ORAL
  Filled 2021-03-12 (×3): qty 1

## 2021-03-12 MED ORDER — ONDANSETRON HCL 4 MG/2ML IJ SOLN: 4.0000 mg | Freq: Four times a day (QID) | INTRAMUSCULAR | Status: DC | PRN

## 2021-03-12 MED ORDER — SEVELAMER CARBONATE 800 MG PO TABS
2400.0000 mg | ORAL_TABLET | Freq: Three times a day (TID) | ORAL | Status: DC
  Administered 2021-03-13: 2400 mg via ORAL
  Filled 2021-03-12 (×7): qty 3

## 2021-03-12 MED ORDER — RANOLAZINE ER 500 MG PO TB12: 500.0000 mg | ORAL_TABLET | Freq: Two times a day (BID) | ORAL | Status: DC

## 2021-03-12 MED ORDER — PENTAFLUOROPROP-TETRAFLUOROETH EX AERO
1.0000 "application " | INHALATION_SPRAY | CUTANEOUS | Status: DC | PRN
  Filled 2021-03-12: qty 30

## 2021-03-12 MED ORDER — CINACALCET HCL 30 MG PO TABS
90.0000 mg | ORAL_TABLET | Freq: Every day | ORAL | Status: DC
  Filled 2021-03-12: qty 3

## 2021-03-12 MED ORDER — HEPARIN SODIUM (PORCINE) 1000 UNIT/ML DIALYSIS
1000.0000 [IU] | INTRAMUSCULAR | Status: DC | PRN
  Filled 2021-03-12: qty 1

## 2021-03-12 MED ORDER — OXYCODONE-ACETAMINOPHEN 5-325 MG PO TABS: 1.0000 | ORAL_TABLET | Freq: Once | ORAL | Status: DC

## 2021-03-12 MED ORDER — LIDOCAINE-PRILOCAINE 2.5-2.5 % EX CREA
TOPICAL_CREAM | Freq: Once | CUTANEOUS | Status: DC
  Filled 2021-03-12: qty 5

## 2021-03-12 MED ORDER — CARVEDILOL 3.125 MG PO TABS: 3.1250 mg | ORAL_TABLET | Freq: Two times a day (BID) | ORAL | Status: DC

## 2021-03-12 MED ORDER — ALTEPLASE 2 MG IJ SOLR
2.0000 mg | Freq: Once | INTRAMUSCULAR | Status: DC | PRN
  Filled 2021-03-12: qty 2

## 2021-03-12 MED ORDER — CHLORHEXIDINE GLUCONATE CLOTH 2 % EX PADS
6.0000 | MEDICATED_PAD | Freq: Every day | CUTANEOUS | Status: DC
  Filled 2021-03-12 (×2): qty 6

## 2021-03-12 MED ORDER — LIDOCAINE HCL (PF) 1 % IJ SOLN: 5.0000 mL | INTRAMUSCULAR | Status: DC | PRN

## 2021-03-12 MED ORDER — HYDROXYZINE HCL 25 MG PO TABS: 50.0000 mg | ORAL_TABLET | Freq: Two times a day (BID) | ORAL | Status: DC | PRN

## 2021-03-12 MED ORDER — NITROGLYCERIN 0.4 MG SL SUBL: 0.4000 mg | SUBLINGUAL_TABLET | SUBLINGUAL | Status: DC | PRN

## 2021-03-12 MED ORDER — HEPARIN SODIUM (PORCINE) 5000 UNIT/ML IJ SOLN: 5000.0000 [IU] | Freq: Three times a day (TID) | INTRAMUSCULAR | Status: DC

## 2021-03-12 NOTE — Progress Notes (Signed)
Pt requesting BFR of 300 max.

## 2021-03-12 NOTE — Progress Notes (Signed)
[facility]  ROUNDING NOTE   Subjective:   [name] is a 60 year old female with past medical history of A. fib on Eliquis, CAD status post CABG, hep C with liver cirrhosis, and end-stage renal disease on dialysis.  Patient presents to the emergency room with complaints of nausea, vomiting, shortness of breath, and diaphoresis.  Also has complaints of chest pain on the left side described as squeezing and mouth twitching.  Patient will be admitted under observation status for Chest pain [R07.9]  Patient is known to our clinic and receives outpatient dialysis treatments at [facility] supervised by Dr. Candiss Norse.  She reports her last dialysis treatment was this past Monday.  She reports not missing any recent dialysis treatments. States her chest began to hurt while getting her nails done. Described as a non-radiating pain with headache. Headache resolved, but chest pain remained. Denies cough and fever. Denies any other pain and discomfort.   Labs shows sodium 136, potassium 6, bicarb 29, BUN 39, creatinine 8.43 and eGFR  5. CT head negative for acute infarcts. Chest Xray negative for new edema and consolidation.   Objective:  Vital signs in last 24 hours:  Temp:  [98.6 F (37 C)-98.7 F (37.1 C)] 98.7 F (37.1 C) (09/28 0310) Pulse Rate:  [65-79] 65 (09/28 1114) Resp:  [10-18] 18 (09/28 1114) BP: (80-142)/(48-96) 126/87 (09/28 1114) SpO2:  [95 %-100 %] 96 % (09/28 1114)  Weight change:  There were no vitals filed for this visit.  Intake/Output: No intake/output data recorded.   Intake/Output this shift:  No intake/output data recorded.  Physical Exam: General: NAD, resting on stretcher  Head: Normocephalic, atraumatic. Moist oral mucosal membranes  Eyes: Anicteric  Neck: Supple, trachea midline  Lungs:  Clear to auscultation, normal effort  Heart: Regular rate and rhythm  Abdomen:  Soft, nontender  Extremities:  no peripheral edema.   Neurologic: Nonfocal, moving all four extremities  Skin: No lesions  Access: Lt AVF    Basic Metabolic Panel: Recent Labs  Lab 03/11/21 1809 03/12/21 0432  NA 136  --   K 6.0* 5.7*  CL 93*  --   CO2 29  --   GLUCOSE 107*  --   BUN 39*  --   CREATININE 8.43*  --   CALCIUM 9.1  --     Liver Function Tests: Recent Labs  Lab 03/11/21 1809  AST 23  ALT 16  ALKPHOS 135*  BILITOT 1.0  PROT 8.8*  ALBUMIN 4.4   Recent Labs  Lab 03/11/21 1809  LIPASE 47   No results for input(s): AMMONIA in the last 168 hours.  CBC: Recent Labs  Lab 03/11/21 1809  WBC 5.3  NEUTROABS 4.0  HGB 15.1*  HCT 45.5  MCV 104.6*  PLT 136*    Cardiac Enzymes: No results for input(s): CKTOTAL, CKMB, CKMBINDEX, TROPONINI in the last 168 hours.  BNP: Invalid input(s): POCBNP  CBG: No results for input(s): GLUCAP in the last 168 hours.  Microbiology: Results for orders placed or performed during the hospital encounter of 03/12/21  Resp Panel by RT-PCR (Flu A&B, Covid) Nasopharyngeal Swab     Status: None   Collection Time: 03/12/21  6:03 AM   Specimen: Nasopharyngeal Swab; Nasopharyngeal(NP) swabs in vial transport medium  Result Value Ref Range Status   SARS Coronavirus 2 by RT PCR NEGATIVE NEGATIVE Final    Comment: (NOTE) SARS-CoV-2 target nucleic acids are NOT DETECTED.  The SARS-CoV-2 RNA is generally detectable in  upper respiratory specimens during the acute phase of infection. The lowest concentration of SARS-CoV-2 viral copies this assay can detect is 138 copies/mL. A negative result does not preclude SARS-Cov-2 infection and should not be used as the sole basis for treatment or other patient management decisions. A negative result may occur with  improper specimen collection/handling, submission of specimen other than nasopharyngeal swab, presence of viral mutation(s) within the areas targeted by this assay, and inadequate number of viral copies(<138 copies/mL). A  negative result must be combined with clinical observations, patient history, and epidemiological information. The expected result is Negative.  Fact Sheet for Patients:  EntrepreneurPulse.com.au  Fact Sheet for Healthcare Providers:  IncredibleEmployment.be  This test is no t yet approved or cleared by the [place] FDA and  has been authorized for detection and/or diagnosis of SARS-CoV-2 by FDA under an Emergency Use Authorization (EUA). This EUA will remain  in effect (meaning this test can be used) for the duration of the COVID-19 declaration under Section 564(b)(1) of the Act, 21 U.S.C.section 360bbb-3(b)(1), unless the authorization is terminated  or revoked sooner.       Influenza A by PCR NEGATIVE NEGATIVE Final   Influenza B by PCR NEGATIVE NEGATIVE Final    Comment: (NOTE) The Xpert Xpress SARS-CoV-2/FLU/RSV plus assay is intended as an aid in the diagnosis of influenza from Nasopharyngeal swab specimens and should not be used as a sole basis for treatment. Nasal washings and aspirates are unacceptable for Xpert Xpress SARS-CoV-2/FLU/RSV testing.  Fact Sheet for Patients: EntrepreneurPulse.com.au  Fact Sheet for Healthcare Providers: IncredibleEmployment.be  This test is not yet approved or cleared by the [place] FDA and has been authorized for detection and/or diagnosis of SARS-CoV-2 by FDA under an Emergency Use Authorization (EUA). This EUA will remain in effect (meaning this test can be used) for the duration of the COVID-19 declaration under Section 564(b)(1) of the Act, 21 U.S.C. section 360bbb-3(b)(1), unless the authorization is terminated or revoked.  Performed at [facility], [address] [place], [place] [address]     Coagulation Studies: No results for input(s): LABPROT, INR in the last 72 hours.  Urinalysis: No results for input(s): COLORURINE,  LABSPEC, PHURINE, GLUCOSEU, HGBUR, BILIRUBINUR, KETONESUR, PROTEINUR, UROBILINOGEN, NITRITE, LEUKOCYTESUR in the last 72 hours.  Invalid input(s): APPERANCEUR    Imaging: DG Chest 2 View  Result Date: 03/11/2021 CLINICAL DATA:  Chest pain EXAM: CHEST - 2 VIEW COMPARISON:  02/01/2021 FINDINGS: No new consolidation or edema. Chronic pulmonary vascular congestion. Similar cardiomegaly. No pleural effusion or pneumothorax. No acute osseous abnormality. IMPRESSION: No acute process in the chest. Similar cardiomegaly and pulmonary vascular congestion. Electronically Signed   By: Macy Mis M.D.   On: 03/11/2021 19:08   CT HEAD WO CONTRAST (5MM)  Result Date: 03/11/2021 CLINICAL DATA:  Pt presents to ED with c/o of chest pain and headache and L sided mouth twitching. Pt states brother suggested pt comes but pt does not have any complaints. Pt is AANDOx4. Pt has HX of dialysis and strokeCerebral hemorrhage suspected EXAM: CT HEAD WITHOUT CONTRAST TECHNIQUE: Contiguous axial images were obtained from the base of the skull through the vertex without intravenous contrast. COMPARISON:  PET-CT 10/19/2020 FINDINGS: Brain: No acute intracranial hemorrhage. No focal mass lesion. No CT evidence of acute infarction. No midline shift or mass effect. No hydrocephalus. Basilar cisterns are patent. Bilateral cortical hypodensities in the frontal lobes and parietal lobes unchanged from comparison exam consistent remote cortical infarctions. There are periventricular  and subcortical white matter hypodensities. Generalized cortical atrophy. Vascular: No hyperdense vessel or unexpected calcification. Skull: Normal. Negative for fracture or focal lesion. Sinuses/Orbits: Paranasal sinuses and mastoid air cells are clear. Orbits are clear. Other: None. IMPRESSION: 1. No acute intracranial findings. 2. No change from comparison CT. 3. Bilateral cortical infarcts again noted. Electronically Signed   By: Suzy Bouchard M.D.   On:  03/11/2021 19:02     Medications:    sodium chloride     sodium chloride      amiodarone  200 mg Oral Daily   apixaban  5 mg Oral BID   carvedilol  3.125 mg Oral BID WC   Chlorhexidine Gluconate Cloth  6 each Topical Q0600   [START ON 03/13/2021] cinacalcet  90 mg Oral Q breakfast   [START ON 03/13/2021] levothyroxine  75 mcg Oral QAC breakfast   lidocaine-prilocaine   Topical Once   pantoprazole  40 mg Oral Daily   patiromer  1 packet Oral Daily   ranolazine  1,000 mg Oral BID   rosuvastatin  20 mg Oral QHS   sacubitril-valsartan  1 tablet Oral BID   sevelamer carbonate  2,400 mg Oral TID WC   sevelamer carbonate  800 mg Oral With snacks   sodium chloride, sodium chloride, acetaminophen, alteplase, heparin, hydrOXYzine, lidocaine (PF), lidocaine-prilocaine, midodrine, nitroGLYCERIN, ondansetron (ZOFRAN) IV, oxyCODONE-acetaminophen, pentafluoroprop-tetrafluoroeth  Assessment/ Plan:  Ms. ANAROSA KUBISIAK is a 60 y.o.  female with past medical history of A. fib on Eliquis, CAD status post CABG, hep C with liver cirrhosis, and end-stage renal disease on dialysis.  Patient presents to the emergency room with complaints of nausea, vomiting, shortness of breath, and diaphoresis.  Also has complaints of chest pain on the left side described as squeezing and mouth twitching.  Patient will be admitted under observation status for Chest pain [R07.9]   [facility] [place] Left AVF 71kg.   Hyperkalemia with end stage renal disease on dialysis. Reports no missed treatments. Last dialysis treatment was Monday. Will receive dialysis today. Will manage potassium with 2K bath. Next treatment will be scheduled for Friday Anemia of chronic kidney disease/ kidney injury  Lab Results  Component Value Date   HGB 15.1 (H) 03/11/2021  Hgb well above target  Secondary Hyperparathyroidism:   Lab Results  Component Value Date   PTH 627 (H) 07/26/2020   CALCIUM 9.1 03/11/2021   PHOS 4.0  07/26/2020  Calcium and phosphorus at goal. Sevelamer with meals   LOS: 0   9/28/20222:01 PM

## 2021-03-12 NOTE — Progress Notes (Signed)
AVF with positive bruit and thrill, accessed with 15g needles with positive return of blood to venous and arterial lines, both lines flush easily.

## 2021-03-12 NOTE — ED Provider Notes (Signed)
[facility] Emergency Department Provider Note  ____________________________________________   Event Date/Time   First MD Initiated Contact with Patient 03/12/21 [phone]     (approximate)  I have reviewed the triage vital signs and the nursing notes.   HISTORY  Chief Complaint Chest Pain and Headache    HPI [name] is a 60 y.o. female with history of hypertension, hyperlipidemia, end-stage renal disease on hemodialysis, CHF, atrial fibrillation, CAD status post CABG, hepatitis C who presents to the emergency department with multiple complaints.  She complains of bilateral headache over her temples for the past day that was gradual in onset.  States pain improved with oxycodone at home but now headache is coming back.  She does have a history of a subdural hematoma but states this feels different.  No head injury.  Not on anticoagulation.  No numbness, tingling or focal weakness.  No fever, neck pain or neck stiffness.  She states she has had a history of similar headaches, last was about 2 months ago.  Also complaining of left-sided chest pain that she describes as a "squeezing".  No aggravating or alleviating factors.  Also ongoing for 2 days intermittently.  Reports associated nausea, diaphoresis and dizziness but states this does not feel like when she had her CABG.  No shortness of breath.  States she has had a mild cough.  No lower extremity swelling or pain.  She is due for dialysis this morning.  Denies any missed dialysis treatments.        Past Medical History:  Diagnosis Date   Anemia    chronic disease   CHF (congestive heart failure) (HCC)    Coronary artery disease    Heart failure (HCC)    Hepatitis    history of hep c   Hyperlipidemia    Hypertension    Myocardial infarction [facility])    Paroxysmal atrial fibrillation ([place])    Renal failure    Renal insufficiency    Stroke [facility]) 2011    Patient Active Problem List   Diagnosis  Date Noted   Acute colitis 02/01/2021   Paroxysmal atrial fibrillation ([place])    Hypothyroidism    Subdural hematoma ([place]) 07/25/2020   Syncope and collapse 07/25/2020   ESRD (end stage renal disease) ([place]) 01/30/2019   Unstable angina ([place]) 01/23/2019   Non-STEMI (non-ST elevated myocardial infarction) ([place]) 08/07/2018   Pneumonia 01/31/2018   Swelling of limb 09/22/2016   Kidney dialysis as the cause of abnormal reaction of the patient, or of later complication, without mention of misadventure at the time of the procedure (CODE) 03/24/2016   Hyperkalemia 11/13/2014   Generalized weakness 10/15/2014   Chest pain 10/15/2014   ESRD on hemodialysis ([place]) 10/15/2014   Congestive heart failure (CHF) ([place]) 10/15/2014   HTN (hypertension) 10/15/2014   Sternal wound dehiscence 04/12/2014   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   Pericardial effusion 04/12/2014   History of delirium 04/12/2014   H/O atrial flutter 04/12/2014   Delayed surgical wound healing 04/12/2014   Chest wall pain following surgery 04/12/2014   S/P CABG (coronary artery bypass graft) 04/02/2014   Lactic acidosis 03/26/2014   Arteriosclerotic dementia ([place]) 03/25/2014   Atrial flutter by electrocardiogram ([place]) 03/22/2014   Superficial incisional surgical site infection 03/14/2014   Postoperative anemia due to acute blood loss 02/27/2014   Obesity 02/27/2014   H/O: substance abuse ([place]) 02/27/2014   Anemia of chronic renal failure 02/27/2014   Acute postoperative pain 02/27/2014  Leukocytosis 02/26/2014   Hypotension 02/24/2014   Exhausted vascular access 02/24/2014   Anemia of chronic disease 02/24/2014   Stroke ([place]) 02/21/2014   S/P CABG x 3 02/21/2014   Dialysis patient ([place]) 02/21/2014   Hyperlipidemia 02/13/2014   History of hepatitis C 02/13/2014   HFrEF (heart failure with reduced ejection fraction) ([place]) 02/13/2014   H/O stroke without residual deficits 02/13/2014   ESRD (end stage renal disease)  on dialysis ([place]) 02/13/2014   End stage renal disease with hypertension ([place]) 02/13/2014   CAD, multiple vessel 02/13/2014   Retrosternal chest pain 05/30/2013   Gallstones 05/29/2013    Past Surgical History:  Procedure Laterality Date   A/V FISTULAGRAM Left 03/30/2019   Procedure: A/V FISTULAGRAM;  Surgeon: Algernon Huxley, MD;  Location: [place] CV LAB;  Service: Cardiovascular;  Laterality: Left;   A/V FISTULAGRAM Left 10/06/2019   Procedure: A/V FISTULAGRAM;  Surgeon: Algernon Huxley, MD;  Location: [place] CV LAB;  Service: Cardiovascular;  Laterality: Left;   CORONARY ANGIOPLASTY WITH STENT PLACEMENT  2013   CORONARY ARTERY BYPASS GRAFT     DIALYSIS FISTULA CREATION     FLEXIBLE SIGMOIDOSCOPY N/A 02/23/2019   Procedure: FLEXIBLE SIGMOIDOSCOPY;  Surgeon: Lollie Sails, MD;  Location: [facility] ENDOSCOPY;  Service: Endoscopy;  Laterality: N/A;   LEFT HEART CATH AND CORS/GRAFTS ANGIOGRAPHY N/A 08/08/2018   Procedure: LEFT HEART CATH AND CORS/GRAFTS ANGIOGRAPHY;  Surgeon: Dionisio David, MD;  Location: [place] CV LAB;  Service: Cardiovascular;  Laterality: N/A;   LEFT HEART CATH AND CORS/GRAFTS ANGIOGRAPHY N/A 01/30/2019   Procedure: LEFT HEART CATH AND CORS/GRAFTS ANGIOGRAPHY;  Surgeon: Dionisio David, MD;  Location: [place] CV LAB;  Service: Cardiovascular;  Laterality: N/A;    Prior to Admission medications   Medication Sig Start Date End Date Taking? Authorizing Provider  amiodarone (PACERONE) 200 MG tablet Take 200 mg by mouth daily. 07/30/20   [provider]  apixaban (ELIQUIS) 5 MG TABS tablet Per instructions TWICE DAILY  (route: oral) 09/11/19   [provider]  carvedilol (COREG) 3.125 MG tablet Take 3.125 mg by mouth 2 (two) times daily. 10/14/20   [provider]  cinacalcet (SENSIPAR) 90 MG tablet Take 90 mg by mouth daily.    [provider]  hydrOXYzine (ATARAX/VISTARIL) 50 MG tablet Take 50 mg by mouth 2 (two) times  daily as needed for itching.     [provider]  levothyroxine (SYNTHROID) 75 MCG tablet Take 75 mcg by mouth daily before breakfast.  11/02/18   [provider]  lidocaine-prilocaine (EMLA) cream APPLY TO ACCESS SITE 30 MINUTES PRIOR TO DIALYSIS 04/08/19   [provider]  midodrine (PROAMATINE) 10 MG tablet Take 10-20 mg by mouth daily. (for low BP associated with dialysis)    [provider]  pantoprazole (PROTONIX) 40 MG tablet Take 40 mg by mouth daily.    [provider]  ranolazine (RANEXA) 500 MG 12 hr tablet Take 500 mg by mouth 2 (two) times daily. 02/14/20   [provider]  rosuvastatin (CRESTOR) 20 MG tablet Take 20 mg by mouth at bedtime. 09/02/20   [provider]  sacubitril-valsartan (ENTRESTO) 24-26 MG It's documented as "patient not taking". 02/03/21   Enzo Bi, MD  sevelamer carbonate (RENVELA) 800 MG tablet Take 2,400 mg by mouth 3 (three) times daily with meals. And 1 tablet with snacks    [provider]  VELTASSA 8.4 g packet Take 1 packet by mouth  daily. 01/27/21   [provider]  ZOFRAN 4 MG tablet Take 4 mg by mouth every 4 (four) hours as needed for nausea/vomiting. 05/20/20   [provider]    Allergies Morphine and related, Levaquin [levofloxacin], Other, Latex, and Tape  Family History  Problem Relation Age of Onset   Hypertension Other    Cancer Other    Renal Disease Other    Ovarian cancer Mother    Kidney disease Father    Breast cancer Maternal Aunt    Breast cancer Maternal Grandmother     Social History Social History   Tobacco Use   Smoking status: Some Days    Packs/day: 0.10    Years: 20.00    Pack years: 2.00    Types: Cigarettes   Smokeless tobacco: Never   Tobacco comments:    smokes one a day  Vaping Use   Vaping Use: Never used  Substance Use Topics   Alcohol use: No   Drug use: No    Review of Systems Constitutional: No fever. Eyes: No  visual changes. ENT: No sore throat. Cardiovascular: Denies chest pain. Respiratory: Denies shortness of breath. Gastrointestinal: No nausea, vomiting, diarrhea. Genitourinary: Negative for dysuria. Musculoskeletal: Negative for back pain. Skin: Negative for rash. Neurological: Negative for focal weakness or numbness.  ____________________________________________   PHYSICAL EXAM:  VITAL SIGNS: ED Triage Vitals  Enc Vitals Group     BP 03/11/21 1807 (!) 129/96     Pulse Rate 03/11/21 1807 79     Resp 03/11/21 1807 18     Temp 03/11/21 1807 98.7 F (37.1 C)     Temp Source 03/11/21 1807 Oral     SpO2 03/11/21 1807 99 %     Weight --      Height --      Head Circumference --      Peak Flow --      Pain Score 03/12/21 0310 6     Pain Loc --      Pain Edu? --      Excl. in [place]? --    CONSTITUTIONAL: Alert and oriented and responds appropriately to questions.  Chronically ill-appearing, no distress HEAD: Normocephalic, atraumatic EYES: Conjunctivae clear, pupils appear equal, EOM appear intact ENT: normal nose; moist mucous membranes NECK: Supple, normal ROM, no meningismus CARD: RRR; S1 and S2 appreciated; no murmurs, no clicks, no rubs, no gallops RESP: Normal chest excursion without splinting or tachypnea; breath sounds clear and equal bilaterally; no wheezes, no rhonchi, no rales, no hypoxia or respiratory distress, speaking full sentences ABD/GI: Normal bowel sounds; non-distended; soft, non-tender, no rebound, no guarding, no peritoneal signs, no hepatosplenomegaly BACK: The back appears normal EXT: Normal ROM in all joints; no deformity noted, no edema; no cyanosis, no calf tenderness or calf swelling, AV fistula in the left upper extremity with normal thrill and bruit SKIN: Normal color for age and race; warm; no rash on exposed skin NEURO: Moves all extremities equally, sensation to light touch intact diffusely, cranial nerves II through XII intact, normal  speech PSYCH: The patient's mood and manner are appropriate.  ____________________________________________   LABS (all labs ordered are listed, but only abnormal results are displayed)  Labs Reviewed  CBC WITH DIFFERENTIAL/PLATELET - Abnormal; Notable for the following components:      Result Value   Hemoglobin 15.1 (*)    MCV 104.6 (*)    MCH 34.7 (*)    Platelets 136 (*)    All other components  within normal limits  COMPREHENSIVE METABOLIC PANEL - Abnormal; Notable for the following components:   Potassium 6.0 (*)    Chloride 93 (*)    Glucose, Bld 107 (*)    BUN 39 (*)    Creatinine, Ser 8.43 (*)    Total Protein 8.8 (*)    Alkaline Phosphatase 135 (*)    GFR, Estimated 5 (*)    All other components within normal limits  POTASSIUM - Abnormal; Notable for the following components:   Potassium 5.7 (*)    All other components within normal limits  TROPONIN I (HIGH SENSITIVITY) - Abnormal; Notable for the following components:   Troponin I (High Sensitivity) 56 (*)    All other components within normal limits  TROPONIN I (HIGH SENSITIVITY) - Abnormal; Notable for the following components:   Troponin I (High Sensitivity) 65 (*)    All other components within normal limits  TROPONIN I (HIGH SENSITIVITY) - Abnormal; Notable for the following components:   Troponin I (High Sensitivity) 80 (*)    All other components within normal limits  RESP PANEL BY RT-PCR (FLU A&B, COVID) ARPGX2  LIPASE, BLOOD   ____________________________________________  EKG   EKG Interpretation  Date/Time:  Wednesday March 12 2021 04:29:20 EDT Ventricular Rate:  69 PR Interval:  256 QRS Duration: 182 QT Interval:  502 QTC Calculation: 537 R Axis:   -39 Text Interpretation: Sinus rhythm with 1st degree A-V block Possible Left atrial enlargement Left axis deviation Right bundle branch block Possible Lateral infarct , age undetermined Inferior infarct , age undetermined Abnormal ECG No  significant change since last tracing Confirmed by Agapita Savarino, Cyril Mourning [phone]) on 03/12/2021 4:47:05 AM          EKG Interpretation  Date/Time:  Wednesday March 12 2021 04:29:20 EDT Ventricular Rate:  69 PR Interval:  256 QRS Duration: 182 QT Interval:  502 QTC Calculation: 537 R Axis:   -39 Text Interpretation: Sinus rhythm with 1st degree A-V block Possible Left atrial enlargement Left axis deviation Right bundle branch block Possible Lateral infarct , age undetermined Inferior infarct , age undetermined Abnormal ECG No significant change since last tracing Confirmed by Pryor Curia [phone]) on 03/12/2021 4:47:05 AM        ____________________________________________  RADIOLOGY Jessie Foot Shanica Castellanos, personally viewed and evaluated these images (plain radiographs) as part of my medical decision making, as well as reviewing the written report by the radiologist.  ED MD interpretation: Chest x-ray shows stable cardiomegaly and pulmonary vascular congestion without overt edema.  CT head shows no acute intracranial abnormality.  Official radiology report(s): DG Chest 2 View  Result Date: 03/11/2021 CLINICAL DATA:  Chest pain EXAM: CHEST - 2 VIEW COMPARISON:  02/01/2021 FINDINGS: No new consolidation or edema. Chronic pulmonary vascular congestion. Similar cardiomegaly. No pleural effusion or pneumothorax. No acute osseous abnormality. IMPRESSION: No acute process in the chest. Similar cardiomegaly and pulmonary vascular congestion. Electronically Signed   By: Macy Mis M.D.   On: 03/11/2021 19:08   CT HEAD WO CONTRAST (5MM)  Result Date: 03/11/2021 CLINICAL DATA:  Pt presents to ED with c/o of chest pain and headache and L sided mouth twitching. Pt states brother suggested pt comes but pt does not have any complaints. Pt is AANDOx4. Pt has HX of dialysis and strokeCerebral hemorrhage suspected EXAM: CT HEAD WITHOUT CONTRAST TECHNIQUE: Contiguous axial images were obtained from the base  of the skull through the vertex without intravenous contrast. COMPARISON:  PET-CT 10/19/2020 FINDINGS: Brain: No acute  intracranial hemorrhage. No focal mass lesion. No CT evidence of acute infarction. No midline shift or mass effect. No hydrocephalus. Basilar cisterns are patent. Bilateral cortical hypodensities in the frontal lobes and parietal lobes unchanged from comparison exam consistent remote cortical infarctions. There are periventricular and subcortical white matter hypodensities. Generalized cortical atrophy. Vascular: No hyperdense vessel or unexpected calcification. Skull: Normal. Negative for fracture or focal lesion. Sinuses/Orbits: Paranasal sinuses and mastoid air cells are clear. Orbits are clear. Other: None. IMPRESSION: 1. No acute intracranial findings. 2. No change from comparison CT. 3. Bilateral cortical infarcts again noted. Electronically Signed   By: Suzy Bouchard M.D.   On: 03/11/2021 19:02    ____________________________________________   PROCEDURES  Procedure(s) performed (including Critical Care):  Procedures  CRITICAL CARE Performed by: Cyril Mourning Bucky Grigg   Total critical care time: 45 minutes  Critical care time was exclusive of separately billable procedures and treating other patients.  Critical care was necessary to treat or prevent imminent or life-threatening deterioration.  Critical care was time spent personally by me on the following activities: development of treatment plan with patient and/or surrogate as well as nursing, discussions with consultants, evaluation of patient's response to treatment, examination of patient, obtaining history from patient or surrogate, ordering and performing treatments and interventions, ordering and review of laboratory studies, ordering and review of radiographic studies, pulse oximetry and re-evaluation of patient's condition.  ____________________________________________   INITIAL IMPRESSION / ASSESSMENT AND PLAN /  ED COURSE  As part of my medical decision making, I reviewed the following data within the [facility] notes reviewed and incorporated, Labs reviewed , EKG interpreted , Old EKG reviewed, Old chart reviewed, Radiograph reviewed , Discussed with admitting physician , and Notes from prior ED visits         Patient here with complaints of headache.  Has history of subdural hematoma.  Denies any head injury.  Not on antiplatelets or anticoagulants.  Neurologically intact.  CT of the head obtained in triage unremarkable.  Low suspicion for subarachnoid hemorrhage, ruptured aneurysm, CVT, meningitis, CVA today.  States oxycodone has helped with these headaches in the past.  Will give dose here in the emergency department.  Patient also complaining chest pain.  She does have significant risk factors including history of CAD status post CABG but is a very poor historian and it is difficult to tell if this is her anginal equivalent or not.  First and second troponins are mildly elevated and slightly rising but appear to be somewhat similar to her baseline given her history of end-stage renal disease.  Will obtain a third troponin to see if there is any change.  Second EKG shows no new ischemic abnormality.  Chest x-ray shows vascular congestion without overt edema.  She does not appear volume overloaded.  No sign of pneumonia, pneumothorax.  Low suspicion for PE, dissection.  She has not tachypneic, tachycardic, hypoxic here.  Patient's labs do show potassium of 6.0.  Will recheck.  Again EKG shows no findings consistent with hyperkalemia.  She is due for dialysis this morning but states now she does not have a ride to get there and she will miss her dialysis and she is requesting that we dialyze her from the emergency department.  Again she does not appear volume overloaded and her blood pressure is normal.  Unless she continues to be hyperkalemic, discussed with patient that she does not  meet criteria for emergent dialysis from the ED and will need  to follow-up as an outpatient.  ED PROGRESS  Patient's repeat potassium is 5.7.  Will give Lokelma.  Again no EKG changes.  She is on cardiac monitoring.  Patient's troponin continues to rise to 80.  Given her medical history with continued chest pain, will admit to the hospitalist service.  6:09 AM Discussed patient's case with hospitalist, Dr. Sidney Ace.  I have recommended admission and patient (and family if present) agree with this plan. Admitting physician will place admission orders.   I reviewed all nursing notes, vitals, pertinent previous records and reviewed/interpreted all EKGs, lab and urine results, imaging (as available).  ____________________________________________   FINAL CLINICAL IMPRESSION(S) / ED DIAGNOSES  Final diagnoses:  Bilateral headache  Nonspecific chest pain  Elevated troponin  Hyperkalemia     ED Discharge Orders     None       *Please note:  Jaidynn Balster Claw was evaluated in Emergency Department on 03/12/2021 for the symptoms described in the history of present illness. She was evaluated in the context of the global COVID-19 pandemic, which necessitated consideration that the patient might be at risk for infection with the SARS-CoV-2 virus that causes COVID-19. Institutional protocols and algorithms that pertain to the evaluation of patients at risk for COVID-19 are in a state of rapid change based on information released by regulatory bodies including the CDC and federal and state organizations. These policies and algorithms were followed during the patient's care in the ED.  Some ED evaluations and interventions may be delayed as a result of limited staffing during and the pandemic.*   Note:  This document was prepared using Dragon voice recognition software and may include unintentional dictation errors.    Berlin Mokry, [name], DO 03/12/21 [phone]

## 2021-03-12 NOTE — ED Notes (Signed)
[facility] called again regarding pt's meal tray. They stated they will send up

## 2021-03-12 NOTE — ED Notes (Signed)
[facility] called and informed that pt has not yet received breakfast tray.

## 2021-03-12 NOTE — Consult Note (Signed)
[name] is a 60 y.o. female  [id number]  Primary Cardiologist: Neoma Laming, MD Reason for Consultation: chest pain  HPI: [name] is a 59 y.o. female with history of hypertension, hyperlipidemia, end-stage renal disease on hemodialysis, CHF, atrial fibrillation, CAD status post CABG, hepatitis C who presents to the emergency department with multiple complaints.  She complains of bilateral headache over her temples for the past day that was gradual in onset.  States pain improved with oxycodone at home but now headache is coming back.  She does have a history of a subdural hematoma but states this feels different.  No head injury.  Not on anticoagulation.  No numbness, tingling or focal weakness.  No fever, neck pain or neck stiffness.  She states she has had a history of similar headaches, last was about 2 months ago.   Also complaining of left-sided chest pain that she describes as a "squeezing".  No aggravating or alleviating factors.  Also ongoing for 2 days intermittently.  Reports associated nausea, diaphoresis and dizziness but states this does not feel like when she had her CABG.  No shortness of breath.  States she has had a mild cough.  No lower extremity swelling or pain.   Review of Systems: Patient reports intermittent chest pain, some dizziness, nausea. Patient reports having pain in upper left shoulder prior to chest pain starting yesterday. Patient was getting her nails done when the pain started. Denies shortness of breath.    Past Medical History:  Diagnosis Date   Anemia    chronic disease   CHF (congestive heart failure) (HCC)    Coronary artery disease    Heart failure (HCC)    Hepatitis    history of hep c   Hyperlipidemia    Hypertension    Myocardial infarction [facility])    Paroxysmal atrial fibrillation ([place])    Renal failure    Renal insufficiency    Stroke [facility]) 2011    (Not in a hospital admission)     lidocaine-prilocaine   Topical Once     Infusions:   Allergies  Allergen Reactions   Morphine And Related Hives   Levaquin [Levofloxacin] Itching    Severe itching; prickly sensation   Other    Latex Rash   Tape Rash    Social History   Socioeconomic History   Marital status: Single    Spouse name: Not on file   Number of children: Not on file   Years of education: Not on file   Highest education level: Not on file  Occupational History   Not on file  Tobacco Use   Smoking status: Some Days    Packs/day: 0.10    Years: 20.00    Pack years: 2.00    Types: Cigarettes   Smokeless tobacco: Never   Tobacco comments:    smokes one a day  Vaping Use   Vaping Use: Never used  Substance and Sexual Activity   Alcohol use: No   Drug use: No   Sexual activity: Not Currently    Birth control/protection: Post-menopausal  Other Topics Concern   Not on file  Social History Narrative   Not on file   Social Determinants of Health   Financial Resource Strain: Not on file  Food Insecurity: Not on file  Transportation Needs: Not on file  Physical Activity: Not on file  Stress: Not on file  Social Connections: Not on file  Intimate Partner Violence: Not on file  Family History  Problem Relation Age of Onset   Hypertension Other    Cancer Other    Renal Disease Other    Ovarian cancer Mother    Kidney disease Father    Breast cancer Maternal Aunt    Breast cancer Maternal Grandmother     PHYSICAL EXAM: Vitals:   03/12/21 0800 03/12/21 0900  BP: (!) 80/48 100/62  Pulse:    Resp: 15 11  Temp:    SpO2:      No intake or output data in the 24 hours ending 03/12/21 0946  General:  Well appearing. No respiratory difficulty HEENT: normal Neck: supple. no JVD. Carotids 2+ bilat; no bruits. No lymphadenopathy or thryomegaly appreciated. Cor: PMI nondisplaced. Regular rate & rhythm. No rubs, gallops or murmurs. Lungs: clear Abdomen: soft, nontender, nondistended. No hepatosplenomegaly. No bruits or  masses. Good bowel sounds. Extremities: no cyanosis, clubbing, rash, edema Neuro: alert & oriented x 3, cranial nerves grossly intact. moves all 4 extremities w/o difficulty. Affect pleasant.  ECG: sinus rhythm with first degree AV block. RBBB, HR 69, non-specific ST changes.   Results for orders placed or performed during the hospital encounter of 03/12/21 (from the past 24 hour(s))  CBC with Differential     Status: Abnormal   Collection Time: 03/11/21  6:09 PM  Result Value Ref Range   WBC 5.3 4.0 - 10.5 K/uL   RBC 4.35 3.87 - 5.11 MIL/uL   Hemoglobin 15.1 (H) 12.0 - 15.0 g/dL   HCT 45.5 36.0 - 46.0 %   MCV 104.6 (H) 80.0 - 100.0 fL   MCH 34.7 (H) 26.0 - 34.0 pg   MCHC 33.2 30.0 - 36.0 g/dL   RDW 14.7 11.5 - 15.5 %   Platelets 136 (L) 150 - 400 K/uL   nRBC 0.0 0.0 - 0.2 %   Neutrophils Relative % 75 %   Neutro Abs 4.0 1.7 - 7.7 K/uL   Lymphocytes Relative 17 %   Lymphs Abs 0.9 0.7 - 4.0 K/uL   Monocytes Relative 7 %   Monocytes Absolute 0.4 0.1 - 1.0 K/uL   Eosinophils Relative 1 %   Eosinophils Absolute 0.0 0.0 - 0.5 K/uL   Basophils Relative 0 %   Basophils Absolute 0.0 0.0 - 0.1 K/uL   Immature Granulocytes 0 %   Abs Immature Granulocytes 0.02 0.00 - 0.07 K/uL  Comprehensive metabolic panel     Status: Abnormal   Collection Time: 03/11/21  6:09 PM  Result Value Ref Range   Sodium 136 135 - 145 mmol/L   Potassium 6.0 (H) 3.5 - 5.1 mmol/L   Chloride 93 (L) 98 - 111 mmol/L   CO2 29 22 - 32 mmol/L   Glucose, Bld 107 (H) 70 - 99 mg/dL   BUN 39 (H) 6 - 20 mg/dL   Creatinine, Ser 8.43 (H) 0.44 - 1.00 mg/dL   Calcium 9.1 8.9 - 10.3 mg/dL   Total Protein 8.8 (H) 6.5 - 8.1 g/dL   Albumin 4.4 3.5 - 5.0 g/dL   AST 23 15 - 41 U/L   ALT 16 0 - 44 U/L   Alkaline Phosphatase 135 (H) 38 - 126 U/L   Total Bilirubin 1.0 0.3 - 1.2 mg/dL   GFR, Estimated 5 (L) >60 mL/min   Anion gap 14 5 - 15  Troponin I (High Sensitivity)     Status: Abnormal   Collection Time: 03/11/21  6:09 PM   Result Value Ref Range   Troponin I (High  Sensitivity) 56 (H) <18 ng/L  Lipase, blood     Status: None   Collection Time: 03/11/21  6:09 PM  Result Value Ref Range   Lipase 47 11 - 51 U/L  Troponin I (High Sensitivity)     Status: Abnormal   Collection Time: 03/11/21  9:29 PM  Result Value Ref Range   Troponin I (High Sensitivity) 65 (H) <18 ng/L  Potassium     Status: Abnormal   Collection Time: 03/12/21  4:32 AM  Result Value Ref Range   Potassium 5.7 (H) 3.5 - 5.1 mmol/L  Troponin I (High Sensitivity)     Status: Abnormal   Collection Time: 03/12/21  4:49 AM  Result Value Ref Range   Troponin I (High Sensitivity) 80 (H) <18 ng/L  Resp Panel by RT-PCR (Flu A&B, Covid) Nasopharyngeal Swab     Status: None   Collection Time: 03/12/21  6:03 AM   Specimen: Nasopharyngeal Swab; Nasopharyngeal(NP) swabs in vial transport medium  Result Value Ref Range   SARS Coronavirus 2 by RT PCR NEGATIVE NEGATIVE   Influenza A by PCR NEGATIVE NEGATIVE   Influenza B by PCR NEGATIVE NEGATIVE   DG Chest 2 View  Result Date: 03/11/2021 CLINICAL DATA:  Chest pain EXAM: CHEST - 2 VIEW COMPARISON:  02/01/2021 FINDINGS: No new consolidation or edema. Chronic pulmonary vascular congestion. Similar cardiomegaly. No pleural effusion or pneumothorax. No acute osseous abnormality. IMPRESSION: No acute process in the chest. Similar cardiomegaly and pulmonary vascular congestion. Electronically Signed   By: Macy Mis M.D.   On: 03/11/2021 19:08   CT HEAD WO CONTRAST (5MM)  Result Date: 03/11/2021 CLINICAL DATA:  Pt presents to ED with c/o of chest pain and headache and L sided mouth twitching. Pt states brother suggested pt comes but pt does not have any complaints. Pt is AANDOx4. Pt has HX of dialysis and strokeCerebral hemorrhage suspected EXAM: CT HEAD WITHOUT CONTRAST TECHNIQUE: Contiguous axial images were obtained from the base of the skull through the vertex without intravenous contrast. COMPARISON:   PET-CT 10/19/2020 FINDINGS: Brain: No acute intracranial hemorrhage. No focal mass lesion. No CT evidence of acute infarction. No midline shift or mass effect. No hydrocephalus. Basilar cisterns are patent. Bilateral cortical hypodensities in the frontal lobes and parietal lobes unchanged from comparison exam consistent remote cortical infarctions. There are periventricular and subcortical white matter hypodensities. Generalized cortical atrophy. Vascular: No hyperdense vessel or unexpected calcification. Skull: Normal. Negative for fracture or focal lesion. Sinuses/Orbits: Paranasal sinuses and mastoid air cells are clear. Orbits are clear. Other: None. IMPRESSION: 1. No acute intracranial findings. 2. No change from comparison CT. 3. Bilateral cortical infarcts again noted. Electronically Signed   By: Suzy Bouchard M.D.   On: 03/11/2021 19:02     ASSESSMENT AND PLAN: Patient continues to have intermittent chest tightness. Patient compliant with home medications. EKG no acute changes. Troponin levels trending up. Increase Ranexa to 1,000 mg twice daily. Will continue to observe.  Engineer, drilling FNP-C

## 2021-03-12 NOTE — ED Notes (Signed)
Cream applied to pt's dialysis site and tegaderm in place.

## 2021-03-12 NOTE — H&P (Addendum)
History and Physical    [name] YKD:[id number] DOB: [birth year] DOA: 03/12/2021  PCP: [name], MD   Patient coming from: Home  I have personally briefly reviewed patient's old medical records in [place]  Chief Complaint: Chest pain  HPI: [name] is a 60 y.o. female with medical history significant for end-stage renal disease on hemodialysis (dialysis days are M/W/F), history of atrial fibrillation on Eliquis, coronary artery disease status post CABG, history of hepatitis C with liver cirrhosis who presents to the ER for evaluation of chest pain. Patient describes her pain as left-sided, squeezing pain that occurred at rest while she was getting her nails done.  It was associated with nausea, vomiting, diaphoresis and shortness of breath.  Pain was nonradiating and has been intermittent which prompted her visit to the emergency room. She also complained of a headache which has resolved but denies having any fever, no chills, no cough, no abdominal pain, no changes in her bowel habits, no dizziness, no lightheadedness, no lower extremity swelling. Labs show sodium 136, potassium 6.0, chloride 93, bicarb 29, glucose 107, BUN 39, creatinine 8.43, calcium 9.1, alkaline phosphatase 135, albumin 4.4, lipase 47, AST 23, ALT 16, total protein 8.8, troponin 56 >> 65 >> 80, white count 5.3, hemoglobin 15.1, hematocrit 45.5, MCV 104.6, RDW 14.7, platelet count 136 Respiratory viral panel is negative CT scan of the head without contrast shows bilateral cortical infarcts.  No acute intracranial findings Chest x-ray reviewed by me shows cardiomegaly and pulmonary vascular congestion which is chronic. Twelve-lead EKG reviewed by me shows sinus rhythm with first-degree AV block, right bundle branch block and left axis deviation.    ED Course: Patient is a 60 year old African-American female with multiple medical problems who presents to the emergency room for evaluation of  left-sided, intermittent chest pain.  She has a history of coronary artery disease status post CABG. Labs showed hyperkalemia and patient is supposed to be dialyzed today. She will be referred to observation status for further evaluation.   Review of Systems: As per HPI otherwise all other systems reviewed and negative.    Past Medical History:  Diagnosis Date   Anemia    chronic disease   CHF (congestive heart failure) (HCC)    Coronary artery disease    Heart failure (HCC)    Hepatitis    history of hep c   Hyperlipidemia    Hypertension    Myocardial infarction [facility])    Paroxysmal atrial fibrillation ([place])    Renal failure    Renal insufficiency    Stroke [facility]) 2011    Past Surgical History:  Procedure Laterality Date   A/V FISTULAGRAM Left 03/30/2019   Procedure: A/V FISTULAGRAM;  Surgeon: Algernon Huxley, MD;  Location: [place] [facility];  Service: Cardiovascular;  Laterality: Left;   A/V FISTULAGRAM Left 10/06/2019   Procedure: A/V FISTULAGRAM;  Surgeon: Algernon Huxley, MD;  Location: [place] [facility];  Service: Cardiovascular;  Laterality: Left;   CORONARY ANGIOPLASTY WITH STENT PLACEMENT  2013   CORONARY ARTERY BYPASS GRAFT     DIALYSIS FISTULA CREATION     FLEXIBLE SIGMOIDOSCOPY N/A 02/23/2019   Procedure: FLEXIBLE SIGMOIDOSCOPY;  Surgeon: Lollie Sails, MD;  [facility] ENDOSCOPY;  Service: Endoscopy;  Laterality: N/A;   LEFT HEART CATH AND CORS/GRAFTS ANGIOGRAPHY N/A 08/08/2018   Procedure: LEFT HEART CATH AND CORS/GRAFTS ANGIOGRAPHY;  Surgeon: Dionisio David, MD;  Location: [place] [facility];  Service: Cardiovascular;  Laterality: N/A;   LEFT HEART CATH AND CORS/GRAFTS ANGIOGRAPHY N/A 01/30/2019   Procedure: LEFT HEART CATH AND CORS/GRAFTS ANGIOGRAPHY;  Surgeon: Dionisio David, MD;  Location: [place] [facility];  Service: Cardiovascular;  Laterality: N/A;     reports that she has been smoking cigarettes. She has a 2.00 pack-year smoking history. She  has never used smokeless tobacco. She reports that she does not drink alcohol and does not use drugs.  Allergies  Allergen Reactions   Morphine And Related Hives   Levaquin [Levofloxacin] Itching    Severe itching; prickly sensation   Other    Latex Rash   Tape Rash    Family History  Problem Relation Age of Onset   Hypertension Other    Cancer Other    Renal Disease Other    Ovarian cancer Mother    Kidney disease Father    Breast cancer Maternal Aunt    Breast cancer Maternal Grandmother       Prior to Admission medications   Medication Sig Start Date End Date Taking? Authorizing Provider  amiodarone (PACERONE) 200 MG tablet Take 200 mg by mouth daily. 07/30/20  Yes [provider]  apixaban (ELIQUIS) 5 MG TABS tablet Take 5 mg by mouth 2 (two) times daily. 09/11/19  Yes [provider]  carvedilol (COREG) 3.125 MG tablet Take 3.125 mg by mouth 2 (two) times daily. 10/14/20  Yes [provider]  cinacalcet (SENSIPAR) 90 MG tablet Take 90 mg by mouth daily.   Yes [provider]  hydrOXYzine (ATARAX/VISTARIL) 50 MG tablet Take 50 mg by mouth 2 (two) times daily as needed for itching.    Yes [provider]  levothyroxine (SYNTHROID) 75 MCG tablet Take 75 mcg by mouth daily before breakfast.  11/02/18  Yes [provider]  lidocaine-prilocaine (EMLA) cream APPLY TO ACCESS SITE 30 MINUTES PRIOR TO DIALYSIS 04/08/19  Yes [provider]  midodrine (PROAMATINE) 10 MG tablet Take 10-20 mg by mouth daily. (for low BP associated with dialysis)   Yes [provider]  oxyCODONE-acetaminophen (PERCOCET/ROXICET) 5-325 MG tablet Take 1 tablet by mouth 2 (two) times daily as needed. 02/08/21  Yes [provider]  pantoprazole (PROTONIX) 40 MG tablet Take 40 mg by mouth daily.   Yes [provider]  ranolazine (RANEXA) 500 MG 12 hr tablet Take 500 mg by mouth 2 (two) times daily. 02/14/20  Yes [provider]  rosuvastatin (CRESTOR) 20 MG tablet Take 20 mg by mouth at bedtime. 09/02/20  Yes [provider]  sevelamer carbonate (RENVELA) 800 MG tablet Take 2,400 mg by mouth 3 (three) times daily with meals. And 1 tablet with snacks   Yes [provider]  VELTASSA 8.4 g packet Take 1 packet by mouth daily. 01/27/21  Yes [provider]  ZOFRAN 4 MG tablet Take 4 mg by mouth every 4 (four) hours as needed for nausea/vomiting. 05/20/20  Yes [provider]  sacubitril-valsartan (ENTRESTO) 24-26 MG It's documented as "patient not taking". 02/03/21   Enzo Bi, MD    Physical Exam: Vitals:   03/12/21 0700 03/12/21 0800 03/12/21 0900 03/12/21 1000  BP: (!) 91/55 (!) 80/48 100/62 104/66  Pulse: 68     Resp: 11 15 11 13   Temp:      TempSrc:      SpO2: 95%        Vitals:   03/12/21 0700 03/12/21 0800 03/12/21 0900 03/12/21 1000  BP: (!) 91/55 (!) 80/48  100/62 104/66  Pulse: 68     Resp: 11 15 11 13   Temp:      TempSrc:      SpO2: 95%         Constitutional: Alert and oriented x 3 . Not in any apparent distress HEENT:      Head: Normocephalic and atraumatic.         Eyes: PERLA, EOMI, Conjunctivae are normal. Sclera is non-icteric.       Mouth/Throat: Mucous membranes are moist.       Neck: Supple with no signs of meningismus. Cardiovascular: Regular rate and rhythm. No murmurs, gallops, or rubs. 2+ symmetrical distal pulses are present . No JVD. No LE edema Respiratory: Respiratory effort normal .Lungs sounds clear bilaterally. No wheezes, crackles, or rhonchi.  Gastrointestinal: Soft, non tender, and non distended with positive bowel sounds.  Genitourinary: No CVA tenderness. Musculoskeletal: Nontender with normal range of motion in all extremities. No cyanosis, or erythema of extremities. Neurologic:  Face is symmetric. Moving all extremities. No gross focal neurologic deficits . Skin: Skin is warm, dry.  No rash or ulcers Psychiatric:  Mood and affect are normal    Labs on Admission: I have personally reviewed following labs and imaging studies  CBC: Recent Labs  Lab 03/11/21 1809  WBC 5.3  NEUTROABS 4.0  HGB 15.1*  HCT 45.5  MCV 104.6*  PLT 166*   Basic Metabolic Panel: Recent Labs  Lab 03/11/21 1809 03/12/21 0432  NA 136  --   K 6.0* 5.7*  CL 93*  --   CO2 29  --   GLUCOSE 107*  --   BUN 39*  --   CREATININE 8.43*  --   CALCIUM 9.1  --    GFR: CrCl cannot be calculated (Unknown ideal weight.). Liver Function Tests: Recent Labs  Lab 03/11/21 1809  AST 23  ALT 16  ALKPHOS 135*  BILITOT 1.0  PROT 8.8*  ALBUMIN 4.4   Recent Labs  Lab 03/11/21 1809  LIPASE 47   No results for input(s): AMMONIA in the last 168 hours. Coagulation Profile: No results for input(s): INR, PROTIME in the last 168 hours. Cardiac Enzymes: No results for input(s): CKTOTAL, CKMB, CKMBINDEX, TROPONINI in the last 168 hours. BNP (last 3 results) No results for input(s): PROBNP in the last 8760 hours. HbA1C: No results for input(s): HGBA1C in the last 72 hours. CBG: No results for input(s): GLUCAP in the last 168 hours. Lipid Profile: No results for input(s): CHOL, HDL, LDLCALC, TRIG, CHOLHDL, LDLDIRECT in the last 72 hours. Thyroid Function Tests: No results for input(s): TSH, T4TOTAL, FREET4, T3FREE, THYROIDAB in the last 72 hours. Anemia Panel: No results for input(s): VITAMINB12, FOLATE, FERRITIN, TIBC, IRON, RETICCTPCT in the last 72 hours. Urine analysis:    Component Value Date/Time   COLORURINE Yellow 09/14/2012 0414   APPEARANCEUR Cloudy 09/14/2012 0414   LABSPEC 1.018 09/14/2012 0414   PHURINE 9.0 09/14/2012 0414   GLUCOSEU 50 mg/dL 09/14/2012 0414   HGBUR 1+ 09/14/2012 0414   BILIRUBINUR Negative 09/14/2012 0414   KETONESUR Negative 09/14/2012 0414   PROTEINUR >=500 09/14/2012 0414   NITRITE Negative 09/14/2012 0414   LEUKOCYTESUR 2+ 09/14/2012 0414    Radiological Exams on Admission: DG  Chest 2 View  Result Date: 03/11/2021 CLINICAL DATA:  Chest pain EXAM: CHEST - 2 VIEW COMPARISON:  02/01/2021 FINDINGS: No new consolidation or edema. Chronic pulmonary vascular congestion. Similar cardiomegaly. No pleural effusion or pneumothorax. No acute osseous abnormality. IMPRESSION:  No acute process in the chest. Similar cardiomegaly and pulmonary vascular congestion. Electronically Signed   By: Macy Mis M.D.   On: 03/11/2021 19:08   CT HEAD WO CONTRAST (5MM)  Result Date: 03/11/2021 CLINICAL DATA:  Pt presents to ED with c/o of chest pain and headache and L sided mouth twitching. Pt states brother suggested pt comes but pt does not have any complaints. Pt is AANDOx4. Pt has HX of dialysis and strokeCerebral hemorrhage suspected EXAM: CT HEAD WITHOUT CONTRAST TECHNIQUE: Contiguous axial images were obtained from the base of the skull through the vertex without intravenous contrast. COMPARISON:  PET-CT 10/19/2020 FINDINGS: Brain: No acute intracranial hemorrhage. No focal mass lesion. No CT evidence of acute infarction. No midline shift or mass effect. No hydrocephalus. Basilar cisterns are patent. Bilateral cortical hypodensities in the frontal lobes and parietal lobes unchanged from comparison exam consistent remote cortical infarctions. There are periventricular and subcortical white matter hypodensities. Generalized cortical atrophy. Vascular: No hyperdense vessel or unexpected calcification. Skull: Normal. Negative for fracture or focal lesion. Sinuses/Orbits: Paranasal sinuses and mastoid air cells are clear. Orbits are clear. Other: None. IMPRESSION: 1. No acute intracranial findings. 2. No change from comparison CT. 3. Bilateral cortical infarcts again noted. Electronically Signed   By: Suzy Bouchard M.D.   On: 03/11/2021 19:02     Assessment/Plan Principal Problem:   Chest pain Active Problems:   CAD, multiple vessel   Hyperkalemia   S/P CABG (coronary artery bypass graft)    HFrEF (heart failure with reduced ejection fraction) (HCC)   H/O stroke without residual deficits   Anemia of chronic disease   ESRD (end stage renal disease) (HCC)   Paroxysmal atrial fibrillation ([place])   Hypothyroidism    Patient is a 60 year old African-American female with a history of coronary artery disease status post CABG who presents to the ER for evaluation of chest pain.      Chest pain Patient has a history of coronary artery disease and is status post CABG She has an uptrending troponin level but is currently chest pain-free Continue aspirin, Coreg, statins and Ranexa We will request cardiology consult    Hyperkalemia No EKG changes Patient received a dose of Lokelma in the ER Patient is scheduled for renal replacement therapy today       End-stage renal disease Dialysis days are M/W/F Consult nephrology for renal placement therapy      Paroxysmal atrial fibrillation Rate controlled on Amiodarone Continue apixaban as primary prophylaxis for an acute stroke        Hypothyroidism Continue Synthroid     Chronic systolic heart failure Last known LVEF of 25 - 30% from a 2D echocardiogram which was done in February, 2021 Continue carvedilol       DVT prophylaxis: Apixaban Code Status: full code  Family Communication: Greater than 50% of time was spent discussing patient's condition and plan of care with patient at the bedside.  All questions and concerns have been addressed.  She verbalizes understanding and agrees to the plan. Disposition Plan: Back to previous home environment Consults called: Nephrology, cardiology Status: Observation    Yumi Insalaco MD [facility]     03/12/2021, 10:22 AM

## 2021-03-13 DIAGNOSIS — D631 Anemia in chronic kidney disease: Secondary | ICD-10-CM | POA: Diagnosis not present

## 2021-03-13 DIAGNOSIS — R0789 Other chest pain: Secondary | ICD-10-CM | POA: Diagnosis not present

## 2021-03-13 DIAGNOSIS — N2581 Secondary hyperparathyroidism of renal origin: Secondary | ICD-10-CM | POA: Diagnosis not present

## 2021-03-13 DIAGNOSIS — R079 Chest pain, unspecified: Secondary | ICD-10-CM | POA: Diagnosis not present

## 2021-03-13 DIAGNOSIS — E875 Hyperkalemia: Secondary | ICD-10-CM | POA: Diagnosis not present

## 2021-03-13 LAB — CBC
HCT: 45.3 % (ref 36.0–46.0)
Hemoglobin: 14.6 g/dL (ref 12.0–15.0)
MCH: 33.4 pg (ref 26.0–34.0)
MCHC: 32.2 g/dL (ref 30.0–36.0)
MCV: 103.7 fL — ABNORMAL HIGH (ref 80.0–100.0)
Platelets: 112 10*3/uL — ABNORMAL LOW (ref 150–400)
RBC: 4.37 MIL/uL (ref 3.87–5.11)
RDW: 14.3 % (ref 11.5–15.5)
WBC: 3.5 10*3/uL — ABNORMAL LOW (ref 4.0–10.5)
nRBC: 0 % (ref 0.0–0.2)

## 2021-03-13 LAB — BASIC METABOLIC PANEL
Anion gap: 15 (ref 5–15)
BUN: 45 mg/dL — ABNORMAL HIGH (ref 6–20)
CO2: 29 mmol/L (ref 22–32)
Calcium: 8.8 mg/dL — ABNORMAL LOW (ref 8.9–10.3)
Chloride: 94 mmol/L — ABNORMAL LOW (ref 98–111)
Creatinine, Ser: 7.82 mg/dL — ABNORMAL HIGH (ref 0.44–1.00)
GFR, Estimated: 6 mL/min — ABNORMAL LOW (ref >60)
Glucose, Bld: 130 mg/dL — ABNORMAL HIGH (ref 70–99)
Potassium: 5.8 mmol/L — ABNORMAL HIGH (ref 3.5–5.1)
Sodium: 138 mmol/L (ref 135–145)

## 2021-03-13 LAB — CREATININE, SERUM
Creatinine, Ser: 7.81 mg/dL — ABNORMAL HIGH (ref 0.44–1.00)
GFR, Estimated: 6 mL/min — ABNORMAL LOW (ref >60)

## 2021-03-13 LAB — TROPONIN I (HIGH SENSITIVITY): Troponin I (High Sensitivity): 56 ng/L — ABNORMAL HIGH (ref <18)

## 2021-03-13 LAB — HEPATITIS B SURFACE ANTIBODY,QUALITATIVE: Hep B S Ab: NONREACTIVE

## 2021-03-13 MED ORDER — NITROGLYCERIN 0.4 MG SL SUBL
0.4000 mg | SUBLINGUAL_TABLET | SUBLINGUAL | 2 refills | Status: DC | PRN
Start: 2021-03-13 — End: 2021-08-07

## 2021-03-13 MED ORDER — RANOLAZINE ER 1000 MG PO TB12: 1000.0000 mg | ORAL_TABLET | Freq: Two times a day (BID) | ORAL | 2 refills | Status: DC

## 2021-03-13 MED ORDER — NITROGLYCERIN 0.4 MG SL SUBL
SUBLINGUAL_TABLET | SUBLINGUAL | Status: AC
  Administered 2021-03-13: 0.4 mg via SUBLINGUAL
  Filled 2021-03-13: qty 1

## 2021-03-13 MED ORDER — NITROGLYCERIN 0.4 MG SL SUBL
0.4000 mg | SUBLINGUAL_TABLET | SUBLINGUAL | Status: DC | PRN
  Administered 2021-03-13: 0.4 mg via SUBLINGUAL

## 2021-03-13 NOTE — Discharge Summary (Signed)
Physician Discharge Summary   [name]  female DOB: 1960/09/04  YNW:[id number]  PCP: [name], MD  Admit date: 03/12/2021 Discharge date: 03/13/2021  Admitted From: home Disposition:  home CODE STATUS: Full code  Discharge Instructions     Discharge instructions   Complete by: As directed    Since your chest pain resolved, cardiology is ok with you going home.  Cardiology increased your Ranexa and nitroglycerin prescribed.  They will schedule you outpatient followup appointment.   Dr. Enzo Bi - -   No wound care   Complete by: As directed         Hospital Course:  For full details, please see H&P, progress notes, consult notes and ancillary notes.  Briefly,  [name] is a 60 y.o. female with medical history significant for end-stage renal disease on hemodialysis (dialysis days are M/W/F), history of atrial fibrillation on Eliquis, coronary artery disease status post CABG, history of hepatitis C with liver cirrhosis who presents to the ER for evaluation of chest pain.  Chest pain history of coronary artery disease s/p CABG --trop high of 80 and then trended down.  Baseline trop around 50's.  No ACS-related changes on EKG.  Chest pain resolved the next day.  Cardiology consulted, and recommended increasing home Ranexa to 1000 mg BID and follow up with outpatient cardiology.   Hyperkalemia ESRD on HD MWF --K+ 6.0 on presentation.  No EKG changes.  Pt has a hx of hyperkalemia due to dietary indiscretion.  Pt received dialysis, however, K+ still 5.8 on the day of discharge.  Pt received Veltassa and nephrology cleared pt for discharge to resume outpatient HD the next day.    Paroxysmal atrial fibrillation --cont Amiodarone --cont apixaban    Hypothyroidism Continue Synthroid   Chronic systolic heart failure Last known LVEF of 25 - 30% from a 2D echocardiogram which was done in February, 2021 Continue carvedilol     Discharge Diagnoses:   Principal Problem:   Chest pain Active Problems:   Hyperkalemia   S/P CABG (coronary artery bypass graft)   HFrEF (heart failure with reduced ejection fraction) (HCC)   H/O stroke without residual deficits   CAD, multiple vessel   Anemia of chronic disease   ESRD (end stage renal disease) (HCC)   Paroxysmal atrial fibrillation (HCC)   Hypothyroidism   30 Day Unplanned Readmission Risk Score    Flowsheet Row ED to Hosp-Admission (Discharged) from 02/01/2021 in [facility]  30 Day Unplanned Readmission Risk Score (%) 21.36 Filed at 02/03/2021 1200       This score is the patient's risk of an unplanned readmission within 30 days of being discharged (0 -100%). The score is based on dignosis, age, lab data, medications, orders, and past utilization.   Low:  0-14.9   Medium: 15-21.9   High: 22-29.9   Extreme: 30 and above         Discharge Instructions:  Allergies as of 03/13/2021       Reactions   Morphine And Related Hives   Levaquin [levofloxacin] Itching   Severe itching; prickly sensation   Other    Latex Rash   Tape Rash        Medication List     STOP taking these medications    Entresto 24-26 MG Generic drug: sacubitril-valsartan       TAKE these medications    amiodarone 200 MG tablet Commonly known as: PACERONE Take 200 mg  by mouth daily.   carvedilol 3.125 MG tablet Commonly known as: COREG Take 3.125 mg by mouth 2 (two) times daily.   cinacalcet 90 MG tablet Commonly known as: SENSIPAR Take 90 mg by mouth daily.   Eliquis 5 MG Tabs tablet Generic drug: apixaban Take 5 mg by mouth 2 (two) times daily.   hydrOXYzine 50 MG tablet Commonly known as: ATARAX/VISTARIL Take 50 mg by mouth 2 (two) times daily as needed for itching.   levothyroxine 75 MCG tablet Commonly known as: SYNTHROID Take 75 mcg by mouth daily before breakfast.   lidocaine-prilocaine cream Commonly known as: EMLA APPLY TO ACCESS  SITE 30 MINUTES PRIOR TO DIALYSIS   midodrine 10 MG tablet Commonly known as: PROAMATINE Take 10-20 mg by mouth daily. (for low BP associated with dialysis)   nitroGLYCERIN 0.4 MG SL tablet Commonly known as: NITROSTAT Place 1 tablet (0.4 mg total) under the tongue every 5 (five) minutes as needed for chest pain.   oxyCODONE-acetaminophen 5-325 MG tablet Commonly known as: PERCOCET/ROXICET Take 1 tablet by mouth 2 (two) times daily as needed.   pantoprazole 40 MG tablet Commonly known as: PROTONIX Take 40 mg by mouth daily.   ranolazine 1000 MG SR tablet Commonly known as: RANEXA Take 1 tablet (1,000 mg total) by mouth 2 (two) times daily. Increased from 500 mg 2 times daily. What changed:  medication strength how much to take additional instructions   rosuvastatin 20 MG tablet Commonly known as: CRESTOR Take 20 mg by mouth at bedtime.   sevelamer carbonate 800 MG tablet Commonly known as: RENVELA Take 2,400 mg by mouth 3 (three) times daily with meals. And 1 tablet with snacks   Veltassa 8.4 g packet Generic drug: patiromer Take 1 packet by mouth daily.   Zofran 4 MG tablet Generic drug: ondansetron Take 4 mg by mouth every 4 (four) hours as needed for nausea/vomiting.         Follow-up Information     [name], MD Follow up in 1 week(s).   Specialty: Internal Medicine Contact information: [place] [address] [phone]         Dionisio David, MD Follow up.   Specialty: Cardiology Contact information: [place] [address] [phone]                 Allergies  Allergen Reactions   Morphine And Related Hives   Levaquin [Levofloxacin] Itching    Severe itching; prickly sensation   Other    Latex Rash   Tape Rash     The results of significant diagnostics from this hospitalization (including imaging, microbiology, ancillary and laboratory) are listed below for reference.    Consultations:   Procedures/Studies: DG Chest 2 View  Result Date: 03/11/2021 CLINICAL DATA:  Chest pain EXAM: CHEST - 2 VIEW COMPARISON:  02/01/2021 FINDINGS: No new consolidation or edema. Chronic pulmonary vascular congestion. Similar cardiomegaly. No pleural effusion or pneumothorax. No acute osseous abnormality. IMPRESSION: No acute process in the chest. Similar cardiomegaly and pulmonary vascular congestion. Electronically Signed   By: Macy Mis M.D.   On: 03/11/2021 19:08   CT HEAD WO CONTRAST (5MM)  Result Date: 03/11/2021 CLINICAL DATA:  Pt presents to ED with c/o of chest pain and headache and L sided mouth twitching. Pt states brother suggested pt comes but pt does not have any complaints. Pt is AANDOx4. Pt has HX of dialysis and strokeCerebral hemorrhage suspected EXAM: CT HEAD WITHOUT CONTRAST TECHNIQUE:  Contiguous axial images were obtained from the base of the skull through the vertex without intravenous contrast. COMPARISON:  PET-CT 10/19/2020 FINDINGS: Brain: No acute intracranial hemorrhage. No focal mass lesion. No CT evidence of acute infarction. No midline shift or mass effect. No hydrocephalus. Basilar cisterns are patent. Bilateral cortical hypodensities in the frontal lobes and parietal lobes unchanged from comparison exam consistent remote cortical infarctions. There are periventricular and subcortical white matter hypodensities. Generalized cortical atrophy. Vascular: No hyperdense vessel or unexpected calcification. Skull: Normal. Negative for fracture or focal lesion. Sinuses/Orbits: Paranasal sinuses and mastoid air cells are clear. Orbits are clear. Other: None. IMPRESSION: 1. No acute intracranial findings. 2. No change from comparison CT. 3. Bilateral cortical infarcts again noted. Electronically Signed   By: Suzy Bouchard M.D.   On: 03/11/2021 19:02      Labs: BNP (last 3 results) No results for input(s): BNP in the last 8760 hours. Basic Metabolic  Panel: Recent Labs  Lab 03/11/21 1809 03/12/21 0432 03/13/21 0644 03/13/21 1119  NA 136  --   --  138  K 6.0* 5.7*  --  5.8*  CL 93*  --   --  94*  CO2 29  --   --  29  GLUCOSE 107*  --   --  130*  BUN 39*  --   --  45*  CREATININE 8.43*  --  7.81* 7.82*  CALCIUM 9.1  --   --  8.8*   Liver Function Tests: Recent Labs  Lab 03/11/21 1809  AST 23  ALT 16  ALKPHOS 135*  BILITOT 1.0  PROT 8.8*  ALBUMIN 4.4   Recent Labs  Lab 03/11/21 1809  LIPASE 47   No results for input(s): AMMONIA in the last 168 hours. CBC: Recent Labs  Lab 03/11/21 1809  WBC 5.3  NEUTROABS 4.0  HGB 15.1*  HCT 45.5  MCV 104.6*  PLT 136*   Cardiac Enzymes: No results for input(s): CKTOTAL, CKMB, CKMBINDEX, TROPONINI in the last 168 hours. BNP: Invalid input(s): POCBNP CBG: No results for input(s): GLUCAP in the last 168 hours. D-Dimer No results for input(s): DDIMER in the last 72 hours. Hgb A1c No results for input(s): HGBA1C in the last 72 hours. Lipid Profile No results for input(s): CHOL, HDL, LDLCALC, TRIG, CHOLHDL, LDLDIRECT in the last 72 hours. Thyroid function studies No results for input(s): TSH, T4TOTAL, T3FREE, THYROIDAB in the last 72 hours.  Invalid input(s): FREET3 Anemia work up No results for input(s): VITAMINB12, FOLATE, FERRITIN, TIBC, IRON, RETICCTPCT in the last 72 hours. Urinalysis    Component Value Date/Time   COLORURINE Yellow 09/14/2012 0414   APPEARANCEUR Cloudy 09/14/2012 0414   LABSPEC 1.018 09/14/2012 0414   PHURINE 9.0 09/14/2012 0414   GLUCOSEU 50 mg/dL 09/14/2012 0414   HGBUR 1+ 09/14/2012 0414   BILIRUBINUR Negative 09/14/2012 0414   KETONESUR Negative 09/14/2012 0414   PROTEINUR >=500 09/14/2012 0414   NITRITE Negative 09/14/2012 0414   LEUKOCYTESUR 2+ 09/14/2012 0414   Sepsis Labs Invalid input(s): PROCALCITONIN,  WBC,  LACTICIDVEN Microbiology Recent Results (from the past 240 hour(s))  Resp Panel by RT-PCR (Flu A&B, Covid)  Nasopharyngeal Swab     Status: None   Collection Time: 03/12/21  6:03 AM   Specimen: Nasopharyngeal Swab; Nasopharyngeal(NP) swabs in vial transport medium  Result Value Ref Range Status   SARS Coronavirus 2 by RT PCR NEGATIVE NEGATIVE Final    Comment: (NOTE) SARS-CoV-2 target nucleic acids are NOT DETECTED.  The SARS-CoV-2 RNA  is generally detectable in upper respiratory specimens during the acute phase of infection. The lowest concentration of SARS-CoV-2 viral copies this assay can detect is 138 copies/mL. A negative result does not preclude SARS-Cov-2 infection and should not be used as the sole basis for treatment or other patient management decisions. A negative result may occur with  improper specimen collection/handling, submission of specimen other than nasopharyngeal swab, presence of viral mutation(s) within the areas targeted by this assay, and inadequate number of viral copies(<138 copies/mL). A negative result must be combined with clinical observations, patient history, and epidemiological information. The expected result is Negative.  Fact Sheet for Patients:  EntrepreneurPulse.com.au  Fact Sheet for Healthcare Providers:  IncredibleEmployment.be  This test is no t yet approved or cleared by the [place] FDA and  has been authorized for detection and/or diagnosis of SARS-CoV-2 by FDA under an Emergency Use Authorization (EUA). This EUA will remain  in effect (meaning this test can be used) for the duration of the COVID-19 declaration under Section 564(b)(1) of the Act, 21 U.S.C.section 360bbb-3(b)(1), unless the authorization is terminated  or revoked sooner.       Influenza A by PCR NEGATIVE NEGATIVE Final   Influenza B by PCR NEGATIVE NEGATIVE Final    Comment: (NOTE) The Xpert Xpress SARS-CoV-2/FLU/RSV plus assay is intended as an aid in the diagnosis of influenza from Nasopharyngeal swab specimens and should not be  used as a sole basis for treatment. Nasal washings and aspirates are unacceptable for Xpert Xpress SARS-CoV-2/FLU/RSV testing.  Fact Sheet for Patients: EntrepreneurPulse.com.au  Fact Sheet for Healthcare Providers: IncredibleEmployment.be  This test is not yet approved or cleared by the [place] FDA and has been authorized for detection and/or diagnosis of SARS-CoV-2 by FDA under an Emergency Use Authorization (EUA). This EUA will remain in effect (meaning this test can be used) for the duration of the COVID-19 declaration under Section 564(b)(1) of the Act, 21 U.S.C. section 360bbb-3(b)(1), unless the authorization is terminated or revoked.  Performed at [facility], [address] [place], [place] [address]      Total time spend on discharging this patient, including the last patient exam, discussing the hospital stay, instructions for ongoing care as it relates to all pertinent caregivers, as well as preparing the medical discharge records, prescriptions, and/or referrals as applicable, is 45 minutes.    Enzo Bi, MD  [facility] 03/13/2021, 12:44 PM

## 2021-03-13 NOTE — Progress Notes (Signed)
Assumed care of pt/ resting comfortably/ call light within reach

## 2021-03-13 NOTE — Progress Notes (Signed)
[facility]  ROUNDING NOTE   Subjective:   [name] is a 60 year old female with past medical history of A. fib on Eliquis, CAD status post CABG, hep C with liver cirrhosis, and end-stage renal disease on dialysis.  Patient presents to the emergency room with complaints of nausea, vomiting, shortness of breath, and diaphoresis.  Also has complaints of chest pain on the left side described as squeezing and mouth twitching.  Patient will be admitted under observation status for Chest pain [R07.9]  Patient is known to our clinic and receives outpatient dialysis treatments at [facility] supervised by Dr. Candiss Norse.  She reports her last dialysis treatment was this past Monday.  She reports not missing any recent dialysis treatments.   Patient seen sitting up in bed Alert and oriented Tolerating meals  States she is ready to go   Objective:  Vital signs in last 24 hours:  Pulse Rate:  [67-76] 76 (09/29 0250) Resp:  [14-18] 16 (09/29 0250) BP: (93-128)/(63-88) 111/73 (09/29 0345) SpO2:  [94 %-97 %] 97 % (09/29 0250) Weight:  [72.7 kg] 72.7 kg (09/28 1625)  Weight change:  Filed Weights   03/12/21 1625  Weight: 72.7 kg    Intake/Output: I/O last 3 completed shifts: In: -  Out: 1000 [Other:1000]   Intake/Output this shift:  No intake/output data recorded.  Physical Exam: General: NAD, resting in bed  Head: Normocephalic, atraumatic. Moist oral mucosal membranes  Eyes: Anicteric  Lungs:  Clear to auscultation, normal effort  Heart: Regular rate and rhythm  Abdomen:  Soft, nontender  Extremities:  no peripheral edema.  Neurologic: Nonfocal, moving all four extremities  Skin: No lesions  Access: Lt AVF    Basic Metabolic Panel: Recent Labs  Lab 03/11/21 1809 03/12/21 0432 03/13/21 0644 03/13/21 1119  NA 136  --   --  138  K 6.0* 5.7*  --  5.8*  CL 93*  --   --  94*  CO2 29  --   --  29  GLUCOSE 107*  --   --  130*  BUN 39*  --   --   45*  CREATININE 8.43*  --  7.81* 7.82*  CALCIUM 9.1  --   --  8.8*     Liver Function Tests: Recent Labs  Lab 03/11/21 1809  AST 23  ALT 16  ALKPHOS 135*  BILITOT 1.0  PROT 8.8*  ALBUMIN 4.4    Recent Labs  Lab 03/11/21 1809  LIPASE 47    No results for input(s): AMMONIA in the last 168 hours.  CBC: Recent Labs  Lab 03/11/21 1809  WBC 5.3  NEUTROABS 4.0  HGB 15.1*  HCT 45.5  MCV 104.6*  PLT 136*     Cardiac Enzymes: No results for input(s): CKTOTAL, CKMB, CKMBINDEX, TROPONINI in the last 168 hours.  BNP: Invalid input(s): POCBNP  CBG: No results for input(s): GLUCAP in the last 168 hours.  Microbiology: Results for orders placed or performed during the hospital encounter of 03/12/21  Resp Panel by RT-PCR (Flu A&B, Covid) Nasopharyngeal Swab     Status: None   Collection Time: 03/12/21  6:03 AM   Specimen: Nasopharyngeal Swab; Nasopharyngeal(NP) swabs in vial transport medium  Result Value Ref Range Status   SARS Coronavirus 2 by RT PCR NEGATIVE NEGATIVE Final    Comment: (NOTE) SARS-CoV-2 target nucleic acids are NOT DETECTED.  The SARS-CoV-2 RNA is generally detectable in upper respiratory specimens during the acute phase of infection.  The lowest concentration of SARS-CoV-2 viral copies this assay can detect is 138 copies/mL. A negative result does not preclude SARS-Cov-2 infection and should not be used as the sole basis for treatment or other patient management decisions. A negative result may occur with  improper specimen collection/handling, submission of specimen other than nasopharyngeal swab, presence of viral mutation(s) within the areas targeted by this assay, and inadequate number of viral copies(<138 copies/mL). A negative result must be combined with clinical observations, patient history, and epidemiological information. The expected result is Negative.  Fact Sheet for Patients:  EntrepreneurPulse.com.au  Fact  Sheet for Healthcare Providers:  IncredibleEmployment.be  This test is no t yet approved or cleared by the [place] FDA and  has been authorized for detection and/or diagnosis of SARS-CoV-2 by FDA under an Emergency Use Authorization (EUA). This EUA will remain  in effect (meaning this test can be used) for the duration of the COVID-19 declaration under Section 564(b)(1) of the Act, 21 U.S.C.section 360bbb-3(b)(1), unless the authorization is terminated  or revoked sooner.       Influenza A by PCR NEGATIVE NEGATIVE Final   Influenza B by PCR NEGATIVE NEGATIVE Final    Comment: (NOTE) The Xpert Xpress SARS-CoV-2/FLU/RSV plus assay is intended as an aid in the diagnosis of influenza from Nasopharyngeal swab specimens and should not be used as a sole basis for treatment. Nasal washings and aspirates are unacceptable for Xpert Xpress SARS-CoV-2/FLU/RSV testing.  Fact Sheet for Patients: EntrepreneurPulse.com.au  Fact Sheet for Healthcare Providers: IncredibleEmployment.be  This test is not yet approved or cleared by the [place] FDA and has been authorized for detection and/or diagnosis of SARS-CoV-2 by FDA under an Emergency Use Authorization (EUA). This EUA will remain in effect (meaning this test can be used) for the duration of the COVID-19 declaration under Section 564(b)(1) of the Act, 21 U.S.C. section 360bbb-3(b)(1), unless the authorization is terminated or revoked.  Performed at [facility], [address] [place],  [address]     Coagulation Studies: No results for input(s): LABPROT, INR in the last 72 hours.  Urinalysis: No results for input(s): COLORURINE, LABSPEC, PHURINE, GLUCOSEU, HGBUR, BILIRUBINUR, KETONESUR, PROTEINUR, UROBILINOGEN, NITRITE, LEUKOCYTESUR in the last 72 hours.  Invalid input(s): APPERANCEUR    Imaging: DG Chest 2 View  Result Date: 03/11/2021 CLINICAL  DATA:  Chest pain EXAM: CHEST - 2 VIEW COMPARISON:  02/01/2021 FINDINGS: No new consolidation or edema. Chronic pulmonary vascular congestion. Similar cardiomegaly. No pleural effusion or pneumothorax. No acute osseous abnormality. IMPRESSION: No acute process in the chest. Similar cardiomegaly and pulmonary vascular congestion. Electronically Signed   By: Macy Mis M.D.   On: 03/11/2021 19:08   CT HEAD WO CONTRAST (5MM)  Result Date: 03/11/2021 CLINICAL DATA:  Pt presents to ED with c/o of chest pain and headache and L sided mouth twitching. Pt states brother suggested pt comes but pt does not have any complaints. Pt is AANDOx4. Pt has HX of dialysis and strokeCerebral hemorrhage suspected EXAM: CT HEAD WITHOUT CONTRAST TECHNIQUE: Contiguous axial images were obtained from the base of the skull through the vertex without intravenous contrast. COMPARISON:  PET-CT 10/19/2020 FINDINGS: Brain: No acute intracranial hemorrhage. No focal mass lesion. No CT evidence of acute infarction. No midline shift or mass effect. No hydrocephalus. Basilar cisterns are patent. Bilateral cortical hypodensities in the frontal lobes and parietal lobes unchanged from comparison exam consistent remote cortical infarctions. There are periventricular and subcortical white matter hypodensities. Generalized cortical atrophy. Vascular:  No hyperdense vessel or unexpected calcification. Skull: Normal. Negative for fracture or focal lesion. Sinuses/Orbits: Paranasal sinuses and mastoid air cells are clear. Orbits are clear. Other: None. IMPRESSION: 1. No acute intracranial findings. 2. No change from comparison CT. 3. Bilateral cortical infarcts again noted. Electronically Signed   By: Suzy Bouchard M.D.   On: 03/11/2021 19:02     Medications:    sodium chloride     sodium chloride      amiodarone  200 mg Oral Daily   apixaban  5 mg Oral BID   carvedilol  3.125 mg Oral BID WC   Chlorhexidine Gluconate Cloth  6 each Topical  Q0600   cinacalcet  90 mg Oral Q breakfast   levothyroxine  75 mcg Oral QAC breakfast   lidocaine-prilocaine   Topical Once   pantoprazole  40 mg Oral Daily   patiromer  1 packet Oral Daily   ranolazine  1,000 mg Oral BID   rosuvastatin  20 mg Oral QHS   sevelamer carbonate  2,400 mg Oral TID WC   sevelamer carbonate  800 mg Oral With snacks   sodium chloride, sodium chloride, acetaminophen, alteplase, heparin, hydrOXYzine, lidocaine (PF), lidocaine-prilocaine, midodrine, nitroGLYCERIN, ondansetron (ZOFRAN) IV, oxyCODONE-acetaminophen, pentafluoroprop-tetrafluoroeth  Assessment/ Plan:  [name] is a 60 y.o.  female with past medical history of A. fib on Eliquis, CAD status post CABG, hep C with liver cirrhosis, and end-stage renal disease on dialysis.  Patient presents to the emergency room with complaints of nausea, vomiting, shortness of breath, and diaphoresis.  Also has complaints of chest pain on the left side described as squeezing and mouth twitching.  Patient will be admitted under observation status for Chest pain [R07.9]   [facility] MWF [place] Left AVF 71kg.   Hyperkalemia with end stage renal disease on dialysis. Reports no missed treatments. Last dialysis treatment was Monday.Received dialysis yesterday. Potassium elevated to 6.0 at that time. Potassium today 5.8. Recommend Vetassa 16.8mg  once. Next treatment will be scheduled for Friday. Cleared to discharge from renal stance and can continue dialysis at outpatient clinic.  Anemia of chronic kidney disease/ kidney injury  Lab Results  Component Value Date   HGB 15.1 (H) 03/11/2021  Hgb above target  Secondary Hyperparathyroidism:   Lab Results  Component Value Date   PTH 627 (H) 07/26/2020   CALCIUM 8.8 (L) 03/13/2021   PHOS 4.0 07/26/2020  Calcium slightly low and phosphorus at goal. Continue Sevelamer with meals   LOS: 0   9/29/20221:07 PM

## 2021-03-13 NOTE — Progress Notes (Signed)
K+ 5.8 / MD aware/ OK to discharge/ iv removed/ discharge instructions explained to pt/ verbalized an understanding.

## 2021-03-13 NOTE — ED Notes (Addendum)
Only one sublingual nitroglycerin tablet given at this time per Randol Kern, NP verbal order. Duplicate error in charting in the [facility]. Pt c/o central chest pain that is constant in nature. Respirations even and unlabored. Pt denies any other accompanying symptoms.

## 2021-03-13 NOTE — Progress Notes (Signed)
SUBJECTIVE:  [name] is a 60 y.o. female with history of hypertension, hyperlipidemia, end-stage renal disease on hemodialysis, CHF, atrial fibrillation, CAD status post CABG, hepatitis C who presented to the emergency department with multiple complaints.  She complained of bilateral headache over her temples for the past day that was gradual in onset.  Also complained of left-sided chest pain that she describes as a "squeezing".    Patient resting in bed this morning. Had an episode of chest pain during the night, received one nitroglycerin which helped. No chest pain since then. Patient describes pain has a tightness, left side of chest.    Vitals:   03/12/21 1625 03/12/21 1830 03/13/21 0250 03/13/21 0345  BP: 114/72 115/83 128/84 111/73  Pulse: 75 75 76   Resp: 14 18 16    Temp:      TempSrc:      SpO2:  94% 97%   Weight: 72.7 kg       Intake/Output Summary (Last 24 hours) at 03/13/2021 0835 Last data filed at 03/12/2021 1625 Gross per 24 hour  Intake --  Output 1000 ml  Net -1000 ml    LABS: Basic Metabolic Panel: Recent Labs    03/11/21 1809 03/12/21 0432 03/13/21 0644  NA 136  --   --   K 6.0* 5.7*  --   CL 93*  --   --   CO2 29  --   --   GLUCOSE 107*  --   --   BUN 39*  --   --   CREATININE 8.43*  --  7.81*  CALCIUM 9.1  --   --    Liver Function Tests: Recent Labs    03/11/21 1809  AST 23  ALT 16  ALKPHOS 135*  BILITOT 1.0  PROT 8.8*  ALBUMIN 4.4   Recent Labs    03/11/21 1809  LIPASE 47   CBC: Recent Labs    03/11/21 1809  WBC 5.3  NEUTROABS 4.0  HGB 15.1*  HCT 45.5  MCV 104.6*  PLT 136*   Cardiac Enzymes: No results for input(s): CKTOTAL, CKMB, CKMBINDEX, TROPONINI in the last 72 hours. BNP: Invalid input(s): POCBNP D-Dimer: No results for input(s): DDIMER in the last 72 hours. Hemoglobin A1C: No results for input(s): HGBA1C in the last 72 hours. Fasting Lipid Panel: No results for input(s): CHOL, HDL, LDLCALC, TRIG,  CHOLHDL, LDLDIRECT in the last 72 hours. Thyroid Function Tests: No results for input(s): TSH, T4TOTAL, T3FREE, THYROIDAB in the last 72 hours.  Invalid input(s): FREET3 Anemia Panel: No results for input(s): VITAMINB12, FOLATE, FERRITIN, TIBC, IRON, RETICCTPCT in the last 72 hours.   PHYSICAL EXAM General: Well developed, well nourished, in no acute distress HEENT:  Normocephalic and atramatic Neck:  No JVD.  Lungs: Clear bilaterally to auscultation and percussion. Heart: HRRR . Normal S1 and S2 without gallops or murmurs.  Abdomen: Bowel sounds are positive, abdomen soft and non-tender  Msk:  Back normal, normal gait. Normal strength and tone for age. Extremities: No clubbing, cyanosis or edema.   Neuro: Alert and oriented X 3. Psych:  Good affect, responds appropriately  TELEMETRY: sinus rhythm, HR 76 bpm  ASSESSMENT AND PLAN: Most recent troponin level back to patient's baseline. No acute changes on EKG. Patient reports no more episodes of chest pain. May be discharged home with increased dose of Ranexa, 1,000 mg twice daily. Follow up in office tomorrow.  Principal Problem:   Chest pain Active Problems:   Hyperkalemia  S/P CABG (coronary artery bypass graft)   HFrEF (heart failure with reduced ejection fraction) (HCC)   H/O stroke without residual deficits   CAD, multiple vessel   Anemia of chronic disease   ESRD (end stage renal disease) (HCC)   Paroxysmal atrial fibrillation ([place])   Hypothyroidism    Judah Chevere, FNP-C 03/13/2021 8:35 AM

## 2021-03-13 NOTE — ED Notes (Signed)
Pt now resting comfortably. Pt requesting this RN turn off lights. Pt advised to let this RN know if any further needs arise or if chest pain or shortness of breath returns. BP 104/66, HR 76, resp 18 at this time.

## 2021-03-13 NOTE — Progress Notes (Signed)
Breakfast tray provided for pt.  

## 2021-03-14 DIAGNOSIS — Z992 Dependence on renal dialysis: Secondary | ICD-10-CM | POA: Diagnosis not present

## 2021-03-14 DIAGNOSIS — N186 End stage renal disease: Secondary | ICD-10-CM | POA: Diagnosis not present

## 2021-03-14 LAB — HEPATITIS B SURFACE ANTIBODY, QUANTITATIVE: Hep B S AB Quant (Post): <3.1 m[IU]/mL — ABNORMAL LOW (ref >9.9)

## 2021-03-15 DIAGNOSIS — Z23 Encounter for immunization: Secondary | ICD-10-CM | POA: Diagnosis not present

## 2021-03-15 DIAGNOSIS — Z992 Dependence on renal dialysis: Secondary | ICD-10-CM | POA: Diagnosis not present

## 2021-03-15 DIAGNOSIS — N186 End stage renal disease: Secondary | ICD-10-CM | POA: Diagnosis not present

## 2021-03-17 DIAGNOSIS — E875 Hyperkalemia: Secondary | ICD-10-CM | POA: Diagnosis not present

## 2021-03-17 DIAGNOSIS — Z992 Dependence on renal dialysis: Secondary | ICD-10-CM | POA: Diagnosis not present

## 2021-03-17 DIAGNOSIS — N186 End stage renal disease: Secondary | ICD-10-CM | POA: Diagnosis not present

## 2021-03-17 DIAGNOSIS — D509 Iron deficiency anemia, unspecified: Secondary | ICD-10-CM | POA: Diagnosis not present

## 2021-03-17 DIAGNOSIS — Z23 Encounter for immunization: Secondary | ICD-10-CM | POA: Diagnosis not present

## 2021-03-17 DIAGNOSIS — N2581 Secondary hyperparathyroidism of renal origin: Secondary | ICD-10-CM | POA: Diagnosis not present

## 2021-03-21 DIAGNOSIS — Z23 Encounter for immunization: Secondary | ICD-10-CM | POA: Diagnosis not present

## 2021-03-21 DIAGNOSIS — Z992 Dependence on renal dialysis: Secondary | ICD-10-CM | POA: Diagnosis not present

## 2021-03-21 DIAGNOSIS — N186 End stage renal disease: Secondary | ICD-10-CM | POA: Diagnosis not present

## 2021-03-24 DIAGNOSIS — N2581 Secondary hyperparathyroidism of renal origin: Secondary | ICD-10-CM | POA: Diagnosis not present

## 2021-03-24 DIAGNOSIS — E875 Hyperkalemia: Secondary | ICD-10-CM | POA: Diagnosis not present

## 2021-03-24 DIAGNOSIS — Z992 Dependence on renal dialysis: Secondary | ICD-10-CM | POA: Diagnosis not present

## 2021-03-24 DIAGNOSIS — N186 End stage renal disease: Secondary | ICD-10-CM | POA: Diagnosis not present

## 2021-03-24 DIAGNOSIS — Z23 Encounter for immunization: Secondary | ICD-10-CM | POA: Diagnosis not present

## 2021-03-25 DIAGNOSIS — N186 End stage renal disease: Secondary | ICD-10-CM | POA: Diagnosis not present

## 2021-03-25 DIAGNOSIS — Z992 Dependence on renal dialysis: Secondary | ICD-10-CM | POA: Diagnosis not present

## 2021-03-25 DIAGNOSIS — I509 Heart failure, unspecified: Secondary | ICD-10-CM | POA: Diagnosis not present

## 2021-03-25 DIAGNOSIS — Z23 Encounter for immunization: Secondary | ICD-10-CM | POA: Diagnosis not present

## 2021-03-26 DIAGNOSIS — N186 End stage renal disease: Secondary | ICD-10-CM | POA: Diagnosis not present

## 2021-03-26 DIAGNOSIS — Z23 Encounter for immunization: Secondary | ICD-10-CM | POA: Diagnosis not present

## 2021-03-26 DIAGNOSIS — Z992 Dependence on renal dialysis: Secondary | ICD-10-CM | POA: Diagnosis not present

## 2021-03-28 DIAGNOSIS — Z23 Encounter for immunization: Secondary | ICD-10-CM | POA: Diagnosis not present

## 2021-03-28 DIAGNOSIS — N186 End stage renal disease: Secondary | ICD-10-CM | POA: Diagnosis not present

## 2021-03-28 DIAGNOSIS — Z992 Dependence on renal dialysis: Secondary | ICD-10-CM | POA: Diagnosis not present

## 2021-03-31 DIAGNOSIS — Z23 Encounter for immunization: Secondary | ICD-10-CM | POA: Diagnosis not present

## 2021-03-31 DIAGNOSIS — N186 End stage renal disease: Secondary | ICD-10-CM | POA: Diagnosis not present

## 2021-03-31 DIAGNOSIS — Z992 Dependence on renal dialysis: Secondary | ICD-10-CM | POA: Diagnosis not present

## 2021-03-31 DIAGNOSIS — E875 Hyperkalemia: Secondary | ICD-10-CM | POA: Diagnosis not present

## 2021-04-01 DIAGNOSIS — R933 Abnormal findings on diagnostic imaging of other parts of digestive tract: Secondary | ICD-10-CM | POA: Diagnosis not present

## 2021-04-01 DIAGNOSIS — K2100 Gastro-esophageal reflux disease with esophagitis, without bleeding: Secondary | ICD-10-CM | POA: Diagnosis not present

## 2021-04-01 DIAGNOSIS — Z8619 Personal history of other infectious and parasitic diseases: Secondary | ICD-10-CM | POA: Diagnosis not present

## 2021-04-01 DIAGNOSIS — K7460 Unspecified cirrhosis of liver: Secondary | ICD-10-CM | POA: Diagnosis not present

## 2021-04-02 DIAGNOSIS — N186 End stage renal disease: Secondary | ICD-10-CM | POA: Diagnosis not present

## 2021-04-02 DIAGNOSIS — Z992 Dependence on renal dialysis: Secondary | ICD-10-CM | POA: Diagnosis not present

## 2021-04-02 DIAGNOSIS — Z23 Encounter for immunization: Secondary | ICD-10-CM | POA: Diagnosis not present

## 2021-04-04 DIAGNOSIS — N186 End stage renal disease: Secondary | ICD-10-CM | POA: Diagnosis not present

## 2021-04-04 DIAGNOSIS — Z23 Encounter for immunization: Secondary | ICD-10-CM | POA: Diagnosis not present

## 2021-04-04 DIAGNOSIS — Z992 Dependence on renal dialysis: Secondary | ICD-10-CM | POA: Diagnosis not present

## 2021-04-07 DIAGNOSIS — N186 End stage renal disease: Secondary | ICD-10-CM | POA: Diagnosis not present

## 2021-04-07 DIAGNOSIS — Z23 Encounter for immunization: Secondary | ICD-10-CM | POA: Diagnosis not present

## 2021-04-07 DIAGNOSIS — Z992 Dependence on renal dialysis: Secondary | ICD-10-CM | POA: Diagnosis not present

## 2021-04-09 DIAGNOSIS — Z992 Dependence on renal dialysis: Secondary | ICD-10-CM | POA: Diagnosis not present

## 2021-04-09 DIAGNOSIS — Z23 Encounter for immunization: Secondary | ICD-10-CM | POA: Diagnosis not present

## 2021-04-09 DIAGNOSIS — N186 End stage renal disease: Secondary | ICD-10-CM | POA: Diagnosis not present

## 2021-04-11 DIAGNOSIS — Z992 Dependence on renal dialysis: Secondary | ICD-10-CM | POA: Diagnosis not present

## 2021-04-11 DIAGNOSIS — Z23 Encounter for immunization: Secondary | ICD-10-CM | POA: Diagnosis not present

## 2021-04-11 DIAGNOSIS — N186 End stage renal disease: Secondary | ICD-10-CM | POA: Diagnosis not present

## 2021-04-14 ENCOUNTER — Encounter: Payer: Self-pay | Admitting: *Deleted

## 2021-04-14 DIAGNOSIS — Z23 Encounter for immunization: Secondary | ICD-10-CM | POA: Diagnosis not present

## 2021-04-14 DIAGNOSIS — Z992 Dependence on renal dialysis: Secondary | ICD-10-CM | POA: Diagnosis not present

## 2021-04-14 DIAGNOSIS — N186 End stage renal disease: Secondary | ICD-10-CM | POA: Diagnosis not present

## 2021-04-14 DIAGNOSIS — E875 Hyperkalemia: Secondary | ICD-10-CM | POA: Diagnosis not present

## 2021-04-15 ENCOUNTER — Telehealth (INDEPENDENT_AMBULATORY_CARE_PROVIDER_SITE_OTHER): Payer: Self-pay

## 2021-04-15 DIAGNOSIS — Z992 Dependence on renal dialysis: Secondary | ICD-10-CM | POA: Diagnosis not present

## 2021-04-15 DIAGNOSIS — N186 End stage renal disease: Secondary | ICD-10-CM | POA: Diagnosis not present

## 2021-04-15 NOTE — Telephone Encounter (Signed)
Spoke with the patient to schedule a fistulagram with Dr.Dew and patient has agreed to 05/01/21. I will need to attempt to set up anesthesia for this patient.

## 2021-04-16 DIAGNOSIS — Z992 Dependence on renal dialysis: Secondary | ICD-10-CM | POA: Diagnosis not present

## 2021-04-16 DIAGNOSIS — N186 End stage renal disease: Secondary | ICD-10-CM | POA: Diagnosis not present

## 2021-04-17 DIAGNOSIS — I509 Heart failure, unspecified: Secondary | ICD-10-CM | POA: Diagnosis not present

## 2021-04-18 DIAGNOSIS — Z992 Dependence on renal dialysis: Secondary | ICD-10-CM | POA: Diagnosis not present

## 2021-04-18 DIAGNOSIS — N186 End stage renal disease: Secondary | ICD-10-CM | POA: Diagnosis not present

## 2021-04-21 DIAGNOSIS — N186 End stage renal disease: Secondary | ICD-10-CM | POA: Diagnosis not present

## 2021-04-21 DIAGNOSIS — E875 Hyperkalemia: Secondary | ICD-10-CM | POA: Diagnosis not present

## 2021-04-21 DIAGNOSIS — Z992 Dependence on renal dialysis: Secondary | ICD-10-CM | POA: Diagnosis not present

## 2021-04-22 ENCOUNTER — Ambulatory Visit
Admission: RE | Admit: 2021-04-22 | Discharge: 2021-04-22 | Disposition: A | Discharge status: Home / Self Care | Payer: Medicare Other | Attending: Gastroenterology | Admitting: Gastroenterology

## 2021-04-22 ENCOUNTER — Encounter: Payer: Self-pay | Admitting: *Deleted

## 2021-04-22 ENCOUNTER — Ambulatory Visit: Payer: Medicare Other | Admitting: Certified Registered"

## 2021-04-22 ENCOUNTER — Encounter
Admission: RE | Disposition: A | Discharge status: Home / Self Care | Payer: Self-pay | Source: Home / Self Care | Attending: Gastroenterology

## 2021-04-22 DIAGNOSIS — D132 Benign neoplasm of duodenum: Secondary | ICD-10-CM | POA: Diagnosis not present

## 2021-04-22 DIAGNOSIS — E039 Hypothyroidism, unspecified: Secondary | ICD-10-CM | POA: Diagnosis not present

## 2021-04-22 DIAGNOSIS — I429 Cardiomyopathy, unspecified: Secondary | ICD-10-CM | POA: Diagnosis not present

## 2021-04-22 DIAGNOSIS — E785 Hyperlipidemia, unspecified: Secondary | ICD-10-CM | POA: Insufficient documentation

## 2021-04-22 DIAGNOSIS — I132 Hypertensive heart and chronic kidney disease with heart failure and with stage 5 chronic kidney disease, or end stage renal disease: Secondary | ICD-10-CM | POA: Diagnosis not present

## 2021-04-22 DIAGNOSIS — I480 Paroxysmal atrial fibrillation: Secondary | ICD-10-CM | POA: Diagnosis not present

## 2021-04-22 DIAGNOSIS — K219 Gastro-esophageal reflux disease without esophagitis: Secondary | ICD-10-CM | POA: Insufficient documentation

## 2021-04-22 DIAGNOSIS — K2970 Gastritis, unspecified, without bleeding: Secondary | ICD-10-CM | POA: Diagnosis not present

## 2021-04-22 DIAGNOSIS — I2510 Atherosclerotic heart disease of native coronary artery without angina pectoris: Secondary | ICD-10-CM | POA: Insufficient documentation

## 2021-04-22 DIAGNOSIS — D631 Anemia in chronic kidney disease: Secondary | ICD-10-CM | POA: Insufficient documentation

## 2021-04-22 DIAGNOSIS — I252 Old myocardial infarction: Secondary | ICD-10-CM | POA: Insufficient documentation

## 2021-04-22 DIAGNOSIS — N186 End stage renal disease: Secondary | ICD-10-CM | POA: Insufficient documentation

## 2021-04-22 DIAGNOSIS — I739 Peripheral vascular disease, unspecified: Secondary | ICD-10-CM | POA: Insufficient documentation

## 2021-04-22 DIAGNOSIS — Z6825 Body mass index (BMI) 25.0-25.9, adult: Secondary | ICD-10-CM | POA: Insufficient documentation

## 2021-04-22 DIAGNOSIS — K7460 Unspecified cirrhosis of liver: Secondary | ICD-10-CM | POA: Diagnosis not present

## 2021-04-22 DIAGNOSIS — F0390 Unspecified dementia without behavioral disturbance: Secondary | ICD-10-CM | POA: Insufficient documentation

## 2021-04-22 DIAGNOSIS — Z8619 Personal history of other infectious and parasitic diseases: Secondary | ICD-10-CM | POA: Insufficient documentation

## 2021-04-22 DIAGNOSIS — F319 Bipolar disorder, unspecified: Secondary | ICD-10-CM | POA: Insufficient documentation

## 2021-04-22 DIAGNOSIS — Z8673 Personal history of transient ischemic attack (TIA), and cerebral infarction without residual deficits: Secondary | ICD-10-CM | POA: Insufficient documentation

## 2021-04-22 DIAGNOSIS — K3189 Other diseases of stomach and duodenum: Secondary | ICD-10-CM | POA: Diagnosis not present

## 2021-04-22 DIAGNOSIS — R933 Abnormal findings on diagnostic imaging of other parts of digestive tract: Secondary | ICD-10-CM | POA: Insufficient documentation

## 2021-04-22 DIAGNOSIS — I509 Heart failure, unspecified: Secondary | ICD-10-CM | POA: Diagnosis not present

## 2021-04-22 DIAGNOSIS — K319 Disease of stomach and duodenum, unspecified: Secondary | ICD-10-CM | POA: Insufficient documentation

## 2021-04-22 DIAGNOSIS — K2960 Other gastritis without bleeding: Secondary | ICD-10-CM | POA: Insufficient documentation

## 2021-04-22 DIAGNOSIS — Z87891 Personal history of nicotine dependence: Secondary | ICD-10-CM | POA: Insufficient documentation

## 2021-04-22 DIAGNOSIS — Z538 Procedure and treatment not carried out for other reasons: Secondary | ICD-10-CM | POA: Diagnosis not present

## 2021-04-22 DIAGNOSIS — K317 Polyp of stomach and duodenum: Secondary | ICD-10-CM | POA: Diagnosis not present

## 2021-04-22 DIAGNOSIS — E669 Obesity, unspecified: Secondary | ICD-10-CM | POA: Insufficient documentation

## 2021-04-22 HISTORY — PX: COLONOSCOPY WITH PROPOFOL: SHX5780

## 2021-04-22 HISTORY — DX: Cardiomyopathy, unspecified: I42.9

## 2021-04-22 HISTORY — DX: Gastro-esophageal reflux disease without esophagitis: K21.9

## 2021-04-22 HISTORY — DX: Varicella without complication: B01.9

## 2021-04-22 HISTORY — PX: ESOPHAGOGASTRODUODENOSCOPY: SHX5428

## 2021-04-22 HISTORY — DX: Unspecified disorder of adult personality and behavior: F69

## 2021-04-22 HISTORY — DX: Obesity, unspecified: E66.9

## 2021-04-22 HISTORY — DX: Peripheral vascular disease, unspecified: I73.9

## 2021-04-22 HISTORY — DX: Personal history of other mental and behavioral disorders: Z86.59

## 2021-04-22 LAB — POCT I-STAT, CHEM 8
BUN: 32 mg/dL — ABNORMAL HIGH (ref 6–20)
Calcium, Ion: 0.58 mmol/L — CL (ref 1.15–1.40)
Chloride: 105 mmol/L (ref 98–111)
Creatinine, Ser: 8.7 mg/dL — ABNORMAL HIGH (ref 0.44–1.00)
Glucose, Bld: 77 mg/dL (ref 70–99)
HCT: 41 % (ref 36.0–46.0)
Hemoglobin: 13.9 g/dL (ref 12.0–15.0)
Potassium: 4.2 mmol/L (ref 3.5–5.1)
Sodium: 135 mmol/L (ref 135–145)
TCO2: 23 mmol/L (ref 22–32)

## 2021-04-22 SURGERY — COLONOSCOPY WITH PROPOFOL
Anesthesia: General

## 2021-04-22 MED ORDER — SODIUM CHLORIDE 0.9 % IV SOLN
INTRAVENOUS | Status: DC
  Administered 2021-04-22: 1000 mL via INTRAVENOUS

## 2021-04-22 MED ORDER — PROPOFOL 500 MG/50ML IV EMUL
INTRAVENOUS | Status: DC | PRN
  Administered 2021-04-22: 145 ug/kg/min via INTRAVENOUS

## 2021-04-22 MED ORDER — PROPOFOL 10 MG/ML IV BOLUS
INTRAVENOUS | Status: DC | PRN
  Administered 2021-04-22 (×2): 10 mg via INTRAVENOUS
  Administered 2021-04-22: 60 mg via INTRAVENOUS
  Administered 2021-04-22: 10 mg via INTRAVENOUS

## 2021-04-22 MED ORDER — GLYCOPYRROLATE 0.2 MG/ML IJ SOLN
INTRAMUSCULAR | Status: DC | PRN
  Administered 2021-04-22: .2 mg via INTRAVENOUS

## 2021-04-22 MED ORDER — LIDOCAINE HCL (CARDIAC) PF 100 MG/5ML IV SOSY
PREFILLED_SYRINGE | INTRAVENOUS | Status: DC | PRN
  Administered 2021-04-22: 100 mg via INTRAVENOUS

## 2021-04-22 NOTE — Transfer of Care (Signed)
Immediate Anesthesia Transfer of Care Note  Patient: Caliegh Middlekauff Wolbert  Procedure(s) Performed: COLONOSCOPY WITH PROPOFOL ESOPHAGOGASTRODUODENOSCOPY (EGD)  Patient Location: Endoscopy Unit  Anesthesia Type:General  Level of Consciousness: drowsy and patient cooperative  Airway & Oxygen Therapy: Patient Spontanous Breathing and Patient connected to face mask oxygen  Post-op Assessment: Report given to RN and Post -op Vital signs reviewed and stable  Post vital signs: Reviewed and stable  Last Vitals:  Vitals Value Taken Time  BP 94/55 04/22/21 1433  Temp 35.8 C 04/22/21 1433  Pulse 67 04/22/21 1437  Resp 15 04/22/21 1437  SpO2 100 % 04/22/21 1437  Vitals shown include unvalidated device data.  Last Pain:  Vitals:   04/22/21 1433  TempSrc: Temporal  PainSc: Asleep         Complications: No notable events documented.

## 2021-04-22 NOTE — Anesthesia Procedure Notes (Signed)
Procedure Name: General with mask airway Date/Time: 04/22/2021 2:17 PM Performed by: Kelton Pillar, CRNA Pre-anesthesia Checklist: Patient identified, Emergency Drugs available, Suction available and Patient being monitored Patient Re-evaluated:Patient Re-evaluated prior to induction Oxygen Delivery Method: Simple face mask Induction Type: IV induction Placement Confirmation: positive ETCO2 and CO2 detector Dental Injury: Teeth and Oropharynx as per pre-operative assessment

## 2021-04-22 NOTE — Op Note (Signed)
[facility] Gastroenterology Patient Name: [name] Procedure Date: 04/22/2021 1:43 PM MRN: [id number] Account #: [id number] Date of Birth: Jan 05, 1961 Admit Type: Outpatient Age: 60 Room: [facility] ENDO ROOM 1 Gender: Female Note Status: Finalized Instrument Name: Altamese Cabal Endoscope [id number] Procedure:             Upper GI endoscopy Indications:           Gastro-esophageal reflux disease, Cirrhosis of Liver Providers:             Andrey Farmer MD, MD Medicines:             Monitored Anesthesia Care Complications:         No immediate complications. Estimated blood loss:                         Minimal. Procedure:             Pre-Anesthesia Assessment:                        - Prior to the procedure, a History and Physical was                         performed, and patient medications and allergies were                         reviewed. The patient is competent. The risks and                         benefits of the procedure and the sedation options and                         risks were discussed with the patient. All questions                         were answered and informed consent was obtained.                         Patient identification and proposed procedure were                         verified by the physician, the nurse, the anesthetist                         and the technician in the endoscopy suite. Mental                         Status Examination: alert and oriented. Airway                         Examination: normal oropharyngeal airway and neck                         mobility. Respiratory Examination: clear to                         auscultation. CV Examination: normal. Prophylactic                         Antibiotics: The patient does not require prophylactic  antibiotics. Prior Anticoagulants: The patient has                         taken Eliquis (apixaban), last dose was 4 days prior                         to  procedure. ASA Grade Assessment: IV - A patient                         with severe systemic disease that is a constant threat                         to life. After reviewing the risks and benefits, the                         patient was deemed in satisfactory condition to                         undergo the procedure. The anesthesia plan was to use                         monitored anesthesia care (MAC). Immediately prior to                         administration of medications, the patient was                         re-assessed for adequacy to receive sedatives. The                         heart rate, respiratory rate, oxygen saturations,                         blood pressure, adequacy of pulmonary ventilation, and                         response to care were monitored throughout the                         procedure. The physical status of the patient was                         re-assessed after the procedure.                        After obtaining informed consent, the endoscope was                         passed under direct vision. Throughout the procedure,                         the patient's blood pressure, pulse, and oxygen                         saturations were monitored continuously. The Endoscope                         was introduced through the mouth, and advanced  to the                         second part of duodenum. The upper GI endoscopy was                         accomplished without difficulty. The patient tolerated                         the procedure well. Findings:      There is no endoscopic evidence of varices in the entire esophagus.      Patchy moderate inflammation characterized by erythema was found in the       gastric antrum. Biopsies were taken with a cold forceps for Helicobacter       pylori testing. Estimated blood loss was minimal.      A single 3 mm sessile polyp with no bleeding was found in the first       portion of the duodenum. The polyp  was removed with a cold snare.       Resection and retrieval were complete. Estimated blood loss was minimal.      The exam of the duodenum was otherwise normal. Impression:            - Gastritis. Biopsied.                        - A single duodenal polyp. Resected and retrieved. Recommendation:        - Perform a colonoscopy today. Procedure Code(s):     --- Professional ---                        [phone], Esophagogastroduodenoscopy, flexible,                         transoral; with removal of tumor(s), polyp(s), or                         other lesion(s) by snare technique Diagnosis Code(s):     --- Professional ---                        K29.70, Gastritis, unspecified, without bleeding                        K31.7, Polyp of stomach and duodenum                        K21.9, Gastro-esophageal reflux disease without                         esophagitis CPT copyright 2019 American Medical Association. All rights reserved. The codes documented in this report are preliminary and upon coder review may  be revised to meet current compliance requirements. Andrey Farmer MD, MD 04/22/2021 2:34:45 PM Number of Addenda: 0 Note Initiated On: 04/22/2021 1:43 PM Estimated Blood Loss:  Estimated blood loss was minimal.      [facility]

## 2021-04-22 NOTE — Progress Notes (Signed)
Istat potassium ordered and blood drawn. K 4.2 and Dr. Andree Elk and Dr. Haig Prophet notified. Ok to proceed with procedure.

## 2021-04-22 NOTE — Op Note (Addendum)
[facility] Gastroenterology Patient Name: [name] Procedure Date: 04/22/2021 1:43 PM MRN: [id number] Account #: [id number] Date of Birth: Mar 27, 1961 Admit Type: Outpatient Age: 60 Room: [facility] ENDO ROOM 1 Gender: Female Note Status: Finalized Instrument Name: Jasper Riling [id number] Procedure:             Colonoscopy Indications:           Abnormal CT of the GI tract Providers:             Andrey Farmer MD, MD Medicines:             Monitored Anesthesia Care Complications:         No immediate complications. Procedure:             Pre-Anesthesia Assessment:                        - Prior to the procedure, a History and Physical was                         performed, and patient medications and allergies were                         reviewed. The patient is competent. The risks and                         benefits of the procedure and the sedation options and                         risks were discussed with the patient. All questions                         were answered and informed consent was obtained.                         Patient identification and proposed procedure were                         verified by the physician, the nurse, the anesthetist                         and the technician in the endoscopy suite. Mental                         Status Examination: alert and oriented. Airway                         Examination: normal oropharyngeal airway and neck                         mobility. Respiratory Examination: clear to                         auscultation. CV Examination: normal. Prophylactic                         Antibiotics: The patient does not require prophylactic                         antibiotics. Prior Anticoagulants: The patient has  taken Eliquis (apixaban), last dose was 4 days prior                         to procedure. ASA Grade Assessment: IV - A patient                         with severe systemic  disease that is a constant threat                         to life. After reviewing the risks and benefits, the                         patient was deemed in satisfactory condition to                         undergo the procedure. The anesthesia plan was to use                         monitored anesthesia care (MAC). Immediately prior to                         administration of medications, the patient was                         re-assessed for adequacy to receive sedatives. The                         heart rate, respiratory rate, oxygen saturations,                         blood pressure, adequacy of pulmonary ventilation, and                         response to care were monitored throughout the                         procedure. The physical status of the patient was                         re-assessed after the procedure.                        After obtaining informed consent, the colonoscope was                         passed under direct vision. Throughout the procedure,                         the patient's blood pressure, pulse, and oxygen                         saturations were monitored continuously. The                         Colonoscope was introduced through the anus with the                         intention of advancing to the cecum.  The scope was                         advanced to the sigmoid colon before the procedure was                         aborted. Medications were given. The colonoscopy was                         technically difficult and complex due to poor bowel                         prep with stool present. The patient tolerated the                         procedure well. The quality of the bowel preparation                         was poor. The colonoscopy was aborted due to poor                         bowel prep with stool present. Findings:      The perianal and digital rectal examinations were normal.      A large amount of stool was found in the  sigmoid colon, precluding       visualization. Impression:            - Preparation of the colon was poor.                        - The procedure was aborted due to poor bowel prep                         with stool present.                        - Stool in the sigmoid colon.                        - No specimens collected. Recommendation:        - Discharge patient to home.                        - Resume previous diet.                        - Continue present medications.                        - Repeat colonoscopy at the next available appointment                         because the bowel preparation was poor.                        - Return to referring physician as previously                         scheduled.                        -  Resume Eliquis (apixaban) at prior dose today. Procedure Code(s):     --- Professional ---                        [phone], 66, Colonoscopy, flexible; diagnostic,                         including collection of specimen(s) by brushing or                         washing, when performed (separate procedure) Diagnosis Code(s):     --- Professional ---                        R93.3, Abnormal findings on diagnostic imaging of                         other parts of digestive tract CPT copyright 2019 American Medical Association. All rights reserved. The codes documented in this report are preliminary and upon coder review may  be revised to meet current compliance requirements. Andrey Farmer MD, MD 04/22/2021 2:37:15 PM Number of Addenda: 0 Note Initiated On: 04/22/2021 1:43 PM Total Procedure Duration: 0 hours 3 minutes 35 seconds  Estimated Blood Loss:  Estimated blood loss: none.      [facility]

## 2021-04-22 NOTE — Anesthesia Preprocedure Evaluation (Signed)
Anesthesia Evaluation  Patient identified by MRN, date of birth, ID band Patient awake    Reviewed: Allergy & Precautions, H&P , NPO status , Patient's Chart, lab work & pertinent test results, reviewed documented beta blocker date and time   Airway Mallampati: III   Neck ROM: full    Dental  (+) Poor Dentition, Teeth Intact   Pulmonary pneumonia, resolved, Patient abstained from smoking., former smoker,    Pulmonary exam normal        Cardiovascular Exercise Tolerance: Poor hypertension, On Medications + angina with exertion + CAD, + Past MI, + Peripheral Vascular Disease and +CHF  Normal cardiovascular exam Rhythm:regular Rate:Normal     Neuro/Psych PSYCHIATRIC DISORDERS Dementia CVA    GI/Hepatic GERD  Medicated,(+) Hepatitis -  Endo/Other  Hypothyroidism   Renal/GU Renal disease  negative genitourinary   Musculoskeletal   Abdominal   Peds  Hematology  (+) Blood dyscrasia, anemia ,   Anesthesia Other Findings Past Medical History: No date: Adult behavior problems     Comment:  frontal lobe CVA No date: Anemia     Comment:  chronic disease No date: Cardiomyopathy ([place]) No date: CHF (congestive heart failure) (HCC) No date: Chicken pox No date: Coronary artery disease No date: GERD (gastroesophageal reflux disease) No date: Heart failure (HCC) No date: Hepatitis     Comment:  history of hep c No date: History of bipolar disorder No date: Hyperlipidemia No date: Hypertension No date: Myocardial infarction ([place]) No date: Obesity No date: Paroxysmal atrial fibrillation ([place]) No date: Peripheral vascular disease ([place]) No date: Renal failure No date: Renal insufficiency 2011: Stroke ([place]) 03/14/2014: Superficial incisional surgical site infection Past Surgical History: 03/30/2019: A/V FISTULAGRAM; Left     Comment:  Procedure: A/V FISTULAGRAM;  Surgeon: Algernon Huxley, MD;               Location: [place] CV LAB;  Service: Cardiovascular;              Laterality: Left; 10/06/2019: A/V FISTULAGRAM; Left     Comment:  Procedure: A/V FISTULAGRAM;  Surgeon: Algernon Huxley, MD;               Location: [place] CV LAB;  Service: Cardiovascular;              Laterality: Left; No date: COLONOSCOPY 2013: CORONARY ANGIOPLASTY WITH STENT PLACEMENT No date: CORONARY ARTERY BYPASS GRAFT No date: DIALYSIS FISTULA CREATION No date: ESOPHAGOGASTRODUODENOSCOPY 02/23/2019: FLEXIBLE SIGMOIDOSCOPY; N/A     Comment:  Procedure: FLEXIBLE SIGMOIDOSCOPY;  Surgeon: Lollie Sails, MD;  Location: [facility] ENDOSCOPY;  Service:               Endoscopy;  Laterality: N/A; 08/08/2018: LEFT HEART CATH AND CORS/GRAFTS ANGIOGRAPHY; N/A     Comment:  Procedure: LEFT HEART CATH AND CORS/GRAFTS ANGIOGRAPHY;               Surgeon: Dionisio David, MD;  Location: [place] CV              LAB;  Service: Cardiovascular;  Laterality: N/A; 01/30/2019: LEFT HEART CATH AND CORS/GRAFTS ANGIOGRAPHY; N/A     Comment:  Procedure: LEFT HEART CATH AND CORS/GRAFTS ANGIOGRAPHY;               Surgeon: Dionisio David, MD;  Location: [place] INVASIVE CV  LAB;  Service: Cardiovascular;  Laterality: N/A; No date: percutaneous insertion intra aortic balloon right cath No date: ultrasound guided pericardiocentesis   Reproductive/Obstetrics negative OB ROS                             Anesthesia Physical Anesthesia Plan  ASA: 4  Anesthesia Plan: General   Post-op Pain Management:    Induction:   PONV Risk Score and Plan:   Airway Management Planned:   Additional Equipment:   Intra-op Plan:   Post-operative Plan:   Informed Consent: I have reviewed the patients History and Physical, chart, labs and discussed the procedure including the risks, benefits and alternatives for the proposed anesthesia with the patient or authorized representative who has indicated his/her  understanding and acceptance.     Dental Advisory Given  Plan Discussed with: CRNA  Anesthesia Plan Comments:         Anesthesia Quick Evaluation

## 2021-04-22 NOTE — Interval H&P Note (Signed)
History and Physical Interval Note:  04/22/2021 2:00 PM  [name]  has presented today for surgery, with the diagnosis of Abnormal Abdominal CT, Cirrhosis, GERD.  The various methods of treatment have been discussed with the patient and family. After consideration of risks, benefits and other options for treatment, the patient has consented to  Procedure(s): COLONOSCOPY WITH PROPOFOL (N/A) ESOPHAGOGASTRODUODENOSCOPY (EGD) (N/A) as a surgical intervention.  The patient's history has been reviewed, patient examined, no change in status, stable for surgery.  I have reviewed the patient's chart and labs.  Questions were answered to the patient's satisfaction.     Lesly Rubenstein  Ok to proceed with EGD/Colonoscopy

## 2021-04-22 NOTE — H&P (Signed)
Outpatient short stay form Pre-procedure 04/22/2021  Vanessa Rubenstein, MD  Primary Physician: Vanessa Maltese, MD  Reason for visit:  Variceal screening/Abnormal imaging  History of present illness:   60 y/o lady with ESRD, a.fib on NOAC (last dose 4 days ago), hep c with SVR, and CAD here for EGD for variceal screening and colonoscopy due to CT scan showing cecal thickening. Had positive cologuard years ago and CT colonography with 1.4 cm lesion that was not seen on flex sig.    Current Facility-Administered Medications:    0.9 %  sodium chloride infusion, , Intravenous, Continuous, Shanetta Nicolls, Hilton Cork, MD  Facility-Administered Medications Ordered in Other Encounters:    sodium chloride flush (NS) 0.9 % injection 3 mL, 3 mL, Intravenous, Q12H, Neoma Laming A, MD, 350 mL at 10/06/19 1212  Medications Prior to Admission  Medication Sig Dispense Refill Last Dose   amiodarone (PACERONE) 200 MG tablet Take 200 mg by mouth daily.   04/21/2021   aspirin EC 81 MG tablet Take 81 mg by mouth daily. Swallow whole.      carvedilol (COREG) 3.125 MG tablet Take 3.125 mg by mouth 2 (two) times daily.   04/21/2021   cinacalcet (SENSIPAR) 90 MG tablet Take 90 mg by mouth daily.   04/21/2021   hydrOXYzine (ATARAX/VISTARIL) 50 MG tablet Take 50 mg by mouth 2 (two) times daily as needed for itching.    Past Week   isosorbide mononitrate (IMDUR) 60 MG 24 hr tablet Take 60 mg by mouth daily.      levothyroxine (SYNTHROID) 75 MCG tablet Take 75 mcg by mouth daily before breakfast.    04/21/2021   lidocaine-prilocaine (EMLA) cream APPLY TO ACCESS SITE 30 MINUTES PRIOR TO DIALYSIS   04/21/2021   midodrine (PROAMATINE) 10 MG tablet Take 10-20 mg by mouth daily. (for low BP associated with dialysis)   04/21/2021   nitroGLYCERIN (NITROSTAT) 0.4 MG SL tablet Place 1 tablet (0.4 mg total) under the tongue every 5 (five) minutes as needed for chest pain. 20 tablet 2 Past Week   oxyCODONE-acetaminophen  (PERCOCET/ROXICET) 5-325 MG tablet Take 1 tablet by mouth 2 (two) times daily as needed.   Past Week   pantoprazole (PROTONIX) 40 MG tablet Take 40 mg by mouth daily.   04/21/2021   ranolazine (RANEXA) 1000 MG SR tablet Take 1 tablet (1,000 mg total) by mouth 2 (two) times daily. Increased from 500 mg 2 times daily. 60 tablet 2 04/21/2021   sacubitril-valsartan (ENTRESTO) 49-51 MG Take 1 tablet by mouth 2 (two) times daily.      sevelamer carbonate (RENVELA) 800 MG tablet Take 2,400 mg by mouth 3 (three) times daily with meals. And 1 tablet with snacks   04/21/2021   sucralfate (CARAFATE) 1 g tablet Take 1 g by mouth 4 (four) times daily -  with meals and at bedtime.   04/21/2021   VELTASSA 8.4 g packet Take 1 packet by mouth daily.   Past Week   ZOFRAN 4 MG tablet Take 4 mg by mouth every 4 (four) hours as needed for nausea/vomiting.   Past Week   apixaban (ELIQUIS) 5 MG TABS tablet Take 5 mg by mouth 2 (two) times daily.   04/11/2021   rosuvastatin (CRESTOR) 20 MG tablet Take 20 mg by mouth at bedtime.        Allergies  Allergen Reactions   Morphine And Related Hives   Levaquin [Levofloxacin] Itching    Severe itching; prickly sensation   Other  Latex Rash   Tape Rash     Past Medical History:  Diagnosis Date   Adult behavior problems    frontal lobe CVA   Anemia    chronic disease   Cardiomyopathy ([place])    CHF (congestive heart failure) (HCC)    Chicken pox    Coronary artery disease    GERD (gastroesophageal reflux disease)    Heart failure (HCC)    Hepatitis    history of hep c   History of bipolar disorder    Hyperlipidemia    Hypertension    Myocardial infarction [facility])    Obesity    Paroxysmal atrial fibrillation ([place])    Peripheral vascular disease ([place])    Renal failure    Renal insufficiency    Stroke [facility]) 2011   Superficial incisional surgical site infection 03/14/2014    Review of systems:  Otherwise negative.    Physical Exam  Gen: Alert, oriented.  Appears stated age.  HEENT: PERRLA. Lungs: No respiratory distress CV: RRR Abd: soft, benign, no masses Ext: No edema    Planned procedures: Proceed with EGD/colonoscopy. The patient understands the nature of the planned procedure, indications, risks, alternatives and potential complications including but not limited to bleeding, infection, perforation, damage to internal organs and possible oversedation/side effects from anesthesia. The patient agrees and gives consent to proceed.  Please refer to procedure notes for findings, recommendations and patient disposition/instructions.     Vanessa Rubenstein, MD [facility]

## 2021-04-23 ENCOUNTER — Encounter: Payer: Self-pay | Admitting: Gastroenterology

## 2021-04-23 DIAGNOSIS — Z992 Dependence on renal dialysis: Secondary | ICD-10-CM | POA: Diagnosis not present

## 2021-04-23 DIAGNOSIS — N186 End stage renal disease: Secondary | ICD-10-CM | POA: Diagnosis not present

## 2021-04-24 DIAGNOSIS — Z992 Dependence on renal dialysis: Secondary | ICD-10-CM | POA: Diagnosis not present

## 2021-04-24 DIAGNOSIS — N186 End stage renal disease: Secondary | ICD-10-CM | POA: Diagnosis not present

## 2021-04-24 LAB — SURGICAL PATHOLOGY

## 2021-04-24 NOTE — Anesthesia Postprocedure Evaluation (Signed)
Anesthesia Post Note  Patient: [name]  Procedure(s) Performed: COLONOSCOPY WITH PROPOFOL ESOPHAGOGASTRODUODENOSCOPY (EGD)  Patient location during evaluation: PACU Anesthesia Type: General Level of consciousness: awake and alert Pain management: pain level controlled Vital Signs Assessment: post-procedure vital signs reviewed and stable Respiratory status: spontaneous breathing, nonlabored ventilation, respiratory function stable and patient connected to nasal cannula oxygen Cardiovascular status: blood pressure returned to baseline and stable Postop Assessment: no apparent nausea or vomiting Anesthetic complications: no   No notable events documented.   Last Vitals:  Vitals:   04/22/21 1453 04/22/21 1503  BP: (!) 97/59 118/72  Pulse: 69 66  Resp: 17 16  Temp:    SpO2: 96% 100%    Last Pain:  Vitals:   04/22/21 1503  TempSrc:   PainSc: 0-No pain                 Molli Barrows

## 2021-04-25 DIAGNOSIS — N186 End stage renal disease: Secondary | ICD-10-CM | POA: Diagnosis not present

## 2021-04-25 DIAGNOSIS — I509 Heart failure, unspecified: Secondary | ICD-10-CM | POA: Diagnosis not present

## 2021-04-25 DIAGNOSIS — Z992 Dependence on renal dialysis: Secondary | ICD-10-CM | POA: Diagnosis not present

## 2021-04-28 DIAGNOSIS — N186 End stage renal disease: Secondary | ICD-10-CM | POA: Diagnosis not present

## 2021-04-28 DIAGNOSIS — Z992 Dependence on renal dialysis: Secondary | ICD-10-CM | POA: Diagnosis not present

## 2021-04-28 DIAGNOSIS — N2581 Secondary hyperparathyroidism of renal origin: Secondary | ICD-10-CM | POA: Diagnosis not present

## 2021-04-28 DIAGNOSIS — E875 Hyperkalemia: Secondary | ICD-10-CM | POA: Diagnosis not present

## 2021-04-30 DIAGNOSIS — Z992 Dependence on renal dialysis: Secondary | ICD-10-CM | POA: Diagnosis not present

## 2021-04-30 DIAGNOSIS — N186 End stage renal disease: Secondary | ICD-10-CM | POA: Diagnosis not present

## 2021-05-01 DIAGNOSIS — F17210 Nicotine dependence, cigarettes, uncomplicated: Secondary | ICD-10-CM | POA: Diagnosis not present

## 2021-05-01 DIAGNOSIS — E782 Mixed hyperlipidemia: Secondary | ICD-10-CM | POA: Diagnosis not present

## 2021-05-01 DIAGNOSIS — I219 Acute myocardial infarction, unspecified: Secondary | ICD-10-CM | POA: Diagnosis not present

## 2021-05-01 DIAGNOSIS — I340 Nonrheumatic mitral (valve) insufficiency: Secondary | ICD-10-CM | POA: Diagnosis not present

## 2021-05-01 DIAGNOSIS — E039 Hypothyroidism, unspecified: Secondary | ICD-10-CM | POA: Diagnosis not present

## 2021-05-01 DIAGNOSIS — I255 Ischemic cardiomyopathy: Secondary | ICD-10-CM | POA: Diagnosis not present

## 2021-05-01 DIAGNOSIS — K219 Gastro-esophageal reflux disease without esophagitis: Secondary | ICD-10-CM | POA: Diagnosis not present

## 2021-05-01 DIAGNOSIS — I2510 Atherosclerotic heart disease of native coronary artery without angina pectoris: Secondary | ICD-10-CM | POA: Diagnosis not present

## 2021-05-01 DIAGNOSIS — I509 Heart failure, unspecified: Secondary | ICD-10-CM | POA: Diagnosis not present

## 2021-05-01 DIAGNOSIS — R0602 Shortness of breath: Secondary | ICD-10-CM | POA: Diagnosis not present

## 2021-05-01 DIAGNOSIS — I6389 Other cerebral infarction: Secondary | ICD-10-CM | POA: Diagnosis not present

## 2021-05-01 DIAGNOSIS — I10 Essential (primary) hypertension: Secondary | ICD-10-CM | POA: Diagnosis not present

## 2021-05-01 DIAGNOSIS — I25810 Atherosclerosis of coronary artery bypass graft(s) without angina pectoris: Secondary | ICD-10-CM | POA: Diagnosis not present

## 2021-05-01 DIAGNOSIS — N186 End stage renal disease: Secondary | ICD-10-CM | POA: Diagnosis not present

## 2021-05-02 DIAGNOSIS — N186 End stage renal disease: Secondary | ICD-10-CM | POA: Diagnosis not present

## 2021-05-02 DIAGNOSIS — Z992 Dependence on renal dialysis: Secondary | ICD-10-CM | POA: Diagnosis not present

## 2021-05-04 DIAGNOSIS — Z992 Dependence on renal dialysis: Secondary | ICD-10-CM | POA: Diagnosis not present

## 2021-05-04 DIAGNOSIS — N186 End stage renal disease: Secondary | ICD-10-CM | POA: Diagnosis not present

## 2021-05-06 DIAGNOSIS — Z992 Dependence on renal dialysis: Secondary | ICD-10-CM | POA: Diagnosis not present

## 2021-05-06 DIAGNOSIS — N186 End stage renal disease: Secondary | ICD-10-CM | POA: Diagnosis not present

## 2021-05-09 DIAGNOSIS — Z992 Dependence on renal dialysis: Secondary | ICD-10-CM | POA: Diagnosis not present

## 2021-05-09 DIAGNOSIS — N186 End stage renal disease: Secondary | ICD-10-CM | POA: Diagnosis not present

## 2021-05-12 ENCOUNTER — Telehealth (INDEPENDENT_AMBULATORY_CARE_PROVIDER_SITE_OTHER): Payer: Self-pay

## 2021-05-12 DIAGNOSIS — Z992 Dependence on renal dialysis: Secondary | ICD-10-CM | POA: Diagnosis not present

## 2021-05-12 DIAGNOSIS — N186 End stage renal disease: Secondary | ICD-10-CM | POA: Diagnosis not present

## 2021-05-12 DIAGNOSIS — E875 Hyperkalemia: Secondary | ICD-10-CM | POA: Diagnosis not present

## 2021-05-12 NOTE — Telephone Encounter (Signed)
Spoke with the patient and she is scheduled with Dr. Lucky Cowboy for a LUE fistulagram on 05/19/21 with a 12:00 pm arrival time to the MM. Pre-procedure instructions were discussed and will  be mailed.

## 2021-05-14 DIAGNOSIS — Z992 Dependence on renal dialysis: Secondary | ICD-10-CM | POA: Diagnosis not present

## 2021-05-14 DIAGNOSIS — N186 End stage renal disease: Secondary | ICD-10-CM | POA: Diagnosis not present

## 2021-05-15 DIAGNOSIS — N186 End stage renal disease: Secondary | ICD-10-CM | POA: Diagnosis not present

## 2021-05-15 DIAGNOSIS — R0602 Shortness of breath: Secondary | ICD-10-CM | POA: Diagnosis not present

## 2021-05-15 DIAGNOSIS — Z992 Dependence on renal dialysis: Secondary | ICD-10-CM | POA: Diagnosis not present

## 2021-05-16 DIAGNOSIS — Z992 Dependence on renal dialysis: Secondary | ICD-10-CM | POA: Diagnosis not present

## 2021-05-16 DIAGNOSIS — N186 End stage renal disease: Secondary | ICD-10-CM | POA: Diagnosis not present

## 2021-05-16 NOTE — Anesthesia Preprocedure Evaluation (Addendum)
Anesthesia Evaluation  Patient identified by MRN, date of birth, ID band Patient awake    Reviewed: Allergy & Precautions, H&P , NPO status , Patient's Chart, lab work & pertinent test results, reviewed documented beta blocker date and time   Airway Mallampati: III   Neck ROM: full    Dental  (+) Poor Dentition, Teeth Intact   Pulmonary resolved, Patient abstained from smoking., former smoker,  CXR 9/22: Negative   Pulmonary exam normal        Cardiovascular Exercise Tolerance: Poor hypertension, On Medications (-) angina (denies recent agina)with exertion + CAD, + Past MI, + Cardiac Stents, + CABG (x3), + Peripheral Vascular Disease and +CHF (Last known LVEF of 25 - 30% from a 2D echocardiogram which was done in February, 2021)  Normal cardiovascular exam+ dysrhythmias (pAFIB) + Valvular Problems/Murmurs  Rhythm:regular Rate:Normal  Cardiology increased Ranexa and nitroglycerin in response to recent complaints of chest pains.  Cardiac cath 2020: All grafts patent, including LIMA to LAD, SVG to Om and PDA, with extesive worsening of native disease            Prox RCA lesion is 100% stenosed.            Mid LAD lesion is 100% stenosed.            Prox Cx to Mid Cx lesion is 90% stenosed.       EKG 02/2021: Sinus rhythm with 1st degree A-V block Possible Left atrial enlargement Left axis deviation Right bundle branch block Possible Lateral infarct , age undetermined Inferior infarct , age undetermined Abnormal ECG  ECHO 07/2020: 1. Left ventricular ejection fraction, by estimation, is 25 to 30%. The  left ventricle has severely decreased function. The left ventricle  demonstrates global hypokinesis. The left ventricular internal cavity size  was severely dilated. There is moderate  concentric left ventricular hypertrophy. Left ventricular diastolic  parameters are consistent with Grade III diastolic dysfunction   (restrictive).  2. Right ventricular systolic function is mildly reduced. The right  ventricular size is mildly enlarged. Mildly increased right ventricular  wall thickness.  3. Left atrial size was severely dilated.  4. Right atrial size was mild to moderately dilated.  5. The mitral valve is degenerative. Mild mitral valve regurgitation.  6. The tricuspid valve is degenerative. Tricuspid valve regurgitation is  mild to moderate.  7. The aortic valve is calcified. Aortic valve regurgitation is not  visualized.    Neuro/Psych PSYCHIATRIC DISORDERS Dementia CVA, No Residual Symptoms    GI/Hepatic GERD  Medicated,(+) Cirrhosis       , History of hepatitis C with liver cirrhosis  Gastritis on recent EGD. No varices noted   Endo/Other  Hypothyroidism   Renal/GU ESRFRenal disease  negative genitourinary   Musculoskeletal   Abdominal Normal abdominal exam  (+)   Peds  Hematology  (+) Blood dyscrasia, anemia ,   Anesthesia Other Findings Left upper extremity fistula with thrill  Past Medical History: No date: Adult behavior problems     Comment:  frontal lobe CVA No date: Anemia     Comment:  chronic disease No date: Cardiomyopathy ([place]) No date: CHF (congestive heart failure) (HCC) No date: Chicken pox No date: Coronary artery disease No date: GERD (gastroesophageal reflux disease) No date: Heart failure (HCC) No date: Hepatitis     Comment:  history of hep c No date: History of bipolar disorder No date: Hyperlipidemia No date: Hypertension No date: Myocardial infarction ([place]) No date: Obesity No  date: Paroxysmal atrial fibrillation (HCC) No date: Peripheral vascular disease ([place]) No date: Renal failure No date: Renal insufficiency 2011: Stroke ([place]) 03/14/2014: Superficial incisional surgical site infection Past Surgical History: 03/30/2019: A/V FISTULAGRAM; Left     Comment:  Procedure: A/V FISTULAGRAM;  Surgeon: Algernon Huxley, MD;                Location: [place] [facility];  Service: Cardiovascular;              Laterality: Left; 10/06/2019: A/V FISTULAGRAM; Left     Comment:  Procedure: A/V FISTULAGRAM;  Surgeon: Algernon Huxley, MD;               Location: [place] [facility];  Service: Cardiovascular;              Laterality: Left; No date: COLONOSCOPY 2013: CORONARY ANGIOPLASTY WITH STENT PLACEMENT No date: CORONARY ARTERY BYPASS GRAFT No date: DIALYSIS FISTULA CREATION No date: ESOPHAGOGASTRODUODENOSCOPY 02/23/2019: FLEXIBLE SIGMOIDOSCOPY; N/A     Comment:  Procedure: FLEXIBLE SIGMOIDOSCOPY;  Surgeon: Lollie Sails, MD;  Location: [facility] ENDOSCOPY;  Service:               Endoscopy;  Laterality: N/A; 08/08/2018: LEFT HEART CATH AND CORS/GRAFTS ANGIOGRAPHY; N/A     Comment:  Procedure: LEFT HEART CATH AND CORS/GRAFTS ANGIOGRAPHY;               Surgeon: Dionisio David, MD;  Location: [place] [facility]              LAB;  Service: Cardiovascular;  Laterality: N/A; 01/30/2019: LEFT HEART CATH AND CORS/GRAFTS ANGIOGRAPHY; N/A     Comment:  Procedure: LEFT HEART CATH AND CORS/GRAFTS ANGIOGRAPHY;               Surgeon: Dionisio David, MD;  Location: [place] [facility]              LAB;  Service: Cardiovascular;  Laterality: N/A; No date: percutaneous insertion intra aortic balloon right cath No date: ultrasound guided pericardiocentesis   Reproductive/Obstetrics negative OB ROS                           Anesthesia Physical  Anesthesia Plan  ASA: 4  Anesthesia Plan: General   Post-op Pain Management:    Induction: Intravenous  PONV Risk Score and Plan: Ondansetron and TIVA  Airway Management Planned: Nasal Cannula and Natural Airway  Additional Equipment:   Intra-op Plan:   Post-operative Plan:   Informed Consent: I have reviewed the patients History and Physical, chart, labs and discussed the procedure including the risks, benefits and alternatives for the proposed  anesthesia with the patient or authorized representative who has indicated his/her understanding and acceptance.     Dental advisory given  Plan Discussed with: Anesthesiologist and CRNA  Anesthesia Plan Comments:        Anesthesia Quick Evaluation

## 2021-05-18 ENCOUNTER — Other Ambulatory Visit (INDEPENDENT_AMBULATORY_CARE_PROVIDER_SITE_OTHER): Payer: Self-pay | Admitting: Nurse Practitioner

## 2021-05-18 DIAGNOSIS — N186 End stage renal disease: Secondary | ICD-10-CM

## 2021-05-19 ENCOUNTER — Ambulatory Visit: Payer: Medicare Other | Admitting: Anesthesiology

## 2021-05-19 ENCOUNTER — Ambulatory Visit
Admission: RE | Admit: 2021-05-19 | Discharge: 2021-05-19 | Disposition: A | Discharge status: Home / Self Care | Payer: Medicare Other | Attending: Vascular Surgery | Admitting: Vascular Surgery

## 2021-05-19 ENCOUNTER — Encounter
Admission: RE | Disposition: A | Discharge status: Home / Self Care | Payer: Self-pay | Source: Home / Self Care | Attending: Vascular Surgery

## 2021-05-19 ENCOUNTER — Other Ambulatory Visit: Payer: Self-pay

## 2021-05-19 DIAGNOSIS — I509 Heart failure, unspecified: Secondary | ICD-10-CM | POA: Diagnosis not present

## 2021-05-19 DIAGNOSIS — I2510 Atherosclerotic heart disease of native coronary artery without angina pectoris: Secondary | ICD-10-CM | POA: Insufficient documentation

## 2021-05-19 DIAGNOSIS — T82858A Stenosis of vascular prosthetic devices, implants and grafts, initial encounter: Secondary | ICD-10-CM | POA: Diagnosis not present

## 2021-05-19 DIAGNOSIS — T82898A Other specified complication of vascular prosthetic devices, implants and grafts, initial encounter: Secondary | ICD-10-CM | POA: Diagnosis not present

## 2021-05-19 DIAGNOSIS — Y841 Kidney dialysis as the cause of abnormal reaction of the patient, or of later complication, without mention of misadventure at the time of the procedure: Secondary | ICD-10-CM | POA: Diagnosis not present

## 2021-05-19 DIAGNOSIS — N186 End stage renal disease: Secondary | ICD-10-CM | POA: Diagnosis not present

## 2021-05-19 DIAGNOSIS — E1151 Type 2 diabetes mellitus with diabetic peripheral angiopathy without gangrene: Secondary | ICD-10-CM | POA: Diagnosis not present

## 2021-05-19 DIAGNOSIS — I132 Hypertensive heart and chronic kidney disease with heart failure and with stage 5 chronic kidney disease, or end stage renal disease: Secondary | ICD-10-CM | POA: Insufficient documentation

## 2021-05-19 DIAGNOSIS — N2581 Secondary hyperparathyroidism of renal origin: Secondary | ICD-10-CM | POA: Diagnosis not present

## 2021-05-19 DIAGNOSIS — E1122 Type 2 diabetes mellitus with diabetic chronic kidney disease: Secondary | ICD-10-CM | POA: Insufficient documentation

## 2021-05-19 DIAGNOSIS — E039 Hypothyroidism, unspecified: Secondary | ICD-10-CM | POA: Diagnosis not present

## 2021-05-19 DIAGNOSIS — Z992 Dependence on renal dialysis: Secondary | ICD-10-CM | POA: Diagnosis not present

## 2021-05-19 DIAGNOSIS — D631 Anemia in chronic kidney disease: Secondary | ICD-10-CM | POA: Diagnosis not present

## 2021-05-19 DIAGNOSIS — E875 Hyperkalemia: Secondary | ICD-10-CM | POA: Diagnosis not present

## 2021-05-19 DIAGNOSIS — Z87891 Personal history of nicotine dependence: Secondary | ICD-10-CM | POA: Diagnosis not present

## 2021-05-19 HISTORY — PX: A/V FISTULAGRAM: CATH118298

## 2021-05-19 LAB — POTASSIUM (ARMC VASCULAR LAB ONLY): Potassium (ARMC vascular lab): 3.1 — ABNORMAL LOW (ref 3.5–5.1)

## 2021-05-19 SURGERY — A/V FISTULAGRAM
Anesthesia: General | Site: Arm Upper | Laterality: Left

## 2021-05-19 MED ORDER — FENTANYL CITRATE (PF) 100 MCG/2ML IJ SOLN
INTRAMUSCULAR | Status: DC | PRN
  Administered 2021-05-19 (×2): 25 ug via INTRAVENOUS

## 2021-05-19 MED ORDER — METHYLPREDNISOLONE SODIUM SUCC 125 MG IJ SOLR: 125.0000 mg | Freq: Once | INTRAMUSCULAR | Status: DC | PRN

## 2021-05-19 MED ORDER — DIPHENHYDRAMINE HCL 50 MG/ML IJ SOLN: 50.0000 mg | Freq: Once | INTRAMUSCULAR | Status: DC | PRN

## 2021-05-19 MED ORDER — FENTANYL CITRATE (PF) 100 MCG/2ML IJ SOLN: 12.5000 ug | Freq: Once | INTRAMUSCULAR | Status: DC | PRN

## 2021-05-19 MED ORDER — PROPOFOL 10 MG/ML IV BOLUS
INTRAVENOUS | Status: DC | PRN
  Administered 2021-05-19 (×2): 20 mg via INTRAVENOUS

## 2021-05-19 MED ORDER — MIDAZOLAM HCL 5 MG/5ML IJ SOLN
INTRAMUSCULAR | Status: DC | PRN
  Administered 2021-05-19: 2 mg via INTRAVENOUS

## 2021-05-19 MED ORDER — PROPOFOL 500 MG/50ML IV EMUL
INTRAVENOUS | Status: AC
  Filled 2021-05-19: qty 50

## 2021-05-19 MED ORDER — ONDANSETRON HCL 4 MG/2ML IJ SOLN: 4.0000 mg | Freq: Four times a day (QID) | INTRAMUSCULAR | Status: DC | PRN

## 2021-05-19 MED ORDER — LIDOCAINE HCL (PF) 2 % IJ SOLN
INTRAMUSCULAR | Status: AC
  Filled 2021-05-19: qty 5

## 2021-05-19 MED ORDER — MIDAZOLAM HCL 2 MG/ML PO SYRP: 8.0000 mg | ORAL_SOLUTION | Freq: Once | ORAL | Status: DC | PRN

## 2021-05-19 MED ORDER — HEPARIN SODIUM (PORCINE) 1000 UNIT/ML IJ SOLN
INTRAMUSCULAR | Status: AC
  Filled 2021-05-19: qty 10

## 2021-05-19 MED ORDER — FENTANYL CITRATE (PF) 100 MCG/2ML IJ SOLN
INTRAMUSCULAR | Status: AC
  Filled 2021-05-19: qty 2

## 2021-05-19 MED ORDER — IODIXANOL 320 MG/ML IV SOLN
INTRAVENOUS | Status: DC | PRN
  Administered 2021-05-19: 15 mL

## 2021-05-19 MED ORDER — LIDOCAINE 2% (20 MG/ML) 5 ML SYRINGE
INTRAMUSCULAR | Status: DC | PRN
  Administered 2021-05-19: 50 mg via INTRAVENOUS

## 2021-05-19 MED ORDER — FAMOTIDINE 20 MG PO TABS: 40.0000 mg | ORAL_TABLET | Freq: Once | ORAL | Status: DC | PRN

## 2021-05-19 MED ORDER — PROPOFOL 500 MG/50ML IV EMUL
INTRAVENOUS | Status: DC | PRN
  Administered 2021-05-19: 25 ug/kg/min via INTRAVENOUS

## 2021-05-19 MED ORDER — HEPARIN SODIUM (PORCINE) 1000 UNIT/ML IJ SOLN
INTRAMUSCULAR | Status: DC | PRN
  Administered 2021-05-19: 3000 [IU] via INTRAVENOUS

## 2021-05-19 MED ORDER — CEFAZOLIN SODIUM-DEXTROSE 1-4 GM/50ML-% IV SOLN
1.0000 g | Freq: Once | INTRAVENOUS | Status: AC
  Administered 2021-05-19: 1 g via INTRAVENOUS

## 2021-05-19 MED ORDER — MIDAZOLAM HCL 2 MG/2ML IJ SOLN
INTRAMUSCULAR | Status: AC
  Filled 2021-05-19: qty 2

## 2021-05-19 MED ORDER — SODIUM CHLORIDE 0.9 % IV SOLN: INTRAVENOUS | Status: DC

## 2021-05-19 SURGICAL SUPPLY — 10 items
BALLN LUTONIX AV 8X60X75 (BALLOONS) ×2
BALLOON LUTONIX AV 8X60X75 (BALLOONS) ×1 IMPLANT
CANNULA 5F STIFF (CANNULA) ×2 IMPLANT
COVER PROBE U/S 5X48 (MISCELLANEOUS) ×2 IMPLANT
DRAPE BRACHIAL (DRAPES) ×2 IMPLANT
KIT ENCORE 26 ADVANTAGE (KITS) ×2 IMPLANT
PACK ANGIOGRAPHY (CUSTOM PROCEDURE TRAY) ×2 IMPLANT
SHEATH BRITE TIP 6FRX5.5 (SHEATH) ×2 IMPLANT
SUT MNCRL AB 4-0 PS2 18 (SUTURE) ×2 IMPLANT
WIRE MAGIC TOR.035 180C (WIRE) ×2 IMPLANT

## 2021-05-19 NOTE — Transfer of Care (Signed)
Immediate Anesthesia Transfer of Care Note  Patient: [name]  Procedure(s) Performed: A/V FISTULAGRAM (Left: Arm Upper)  Patient Location: PACU and spu  Anesthesia Type:General  Level of Consciousness: awake, alert  and oriented  Airway & Oxygen Therapy: Patient Spontanous Breathing  Post-op Assessment: Report given to RN and Post -op Vital signs reviewed and stable  Post vital signs: Reviewed  Last Vitals:  Vitals Value Taken Time  BP    Temp    Pulse    Resp    SpO2      Last Pain:  Vitals:   05/19/21 1233  PainSc: 0-No pain         Complications:  Encounter Notable Events  Notable Event Outcome Phase Comment  None  Intraprocedure

## 2021-05-19 NOTE — Op Note (Addendum)
[facility]    OPERATIVE NOTE   PROCEDURE: 1.   Left brachiobasilic arteriovenous fistula cannulation under ultrasound guidance 2.   Left arm fistulagram including central venogram 3.   Percutaneous transluminal angioplasty of the anastomosis of the jump graft to the axillary vein with 8 mm diameter by 6 cm length Lutonix drug-coated angioplasty balloon  PRE-OPERATIVE DIAGNOSIS: 1. ESRD 2. Poorly functional left brachiobasilic AVF with previous Artegraft jump graft  POST-OPERATIVE DIAGNOSIS: same as above   SURGEON: Leotis Pain, MD  ANESTHESIA: local with MCS  ESTIMATED BLOOD LOSS: 3 cc  FINDING(S): Moderate stenosis in the 60 to 65% range at the anastomosis of the jump graft to the axillary vein.  The jump graft itself is widely patent and the remainder the central venous circulation was widely patent.  The original arteriovenous anastomosis was also patent.  SPECIMEN(S):  None  CONTRAST: 15 cc  FLUORO TIME:  2.2 minutes  Anesthesia: MAC  INDICATIONS: [name] is a 60 y.o. female who presents with malfunctioning left arm arteriovenous fistula with a previous Artegraft jump graft.  The patient is scheduled for left arm fistulagram.  The patient is aware the risks include but are not limited to: bleeding, infection, thrombosis of the cannulated access, and possible anaphylactic reaction to the contrast.  The patient is aware of the risks of the procedure and elects to proceed forward.  DESCRIPTION: After full informed written consent was obtained, the patient was brought back to the angiography suite and placed supine upon the angiography table.  The patient was connected to monitoring equipment. Moderate conscious sedation was administered with a face to face encounter with the patient throughout the procedure with my supervision of the RN administering medicines and monitoring the patient's vital signs and mental status throughout from the start of  the procedure until the patient was taken to the recovery room. The left arm was prepped and draped in the standard fashion for a percutaneous access intervention.  Under ultrasound guidance, the left brachiobasilic arteriovenous fistula was cannulated with a micropuncture needle under direct ultrasound guidance and the initial portion of the jump graft portion of the access where it was patent and a permanent image was performed.  The microwire was advanced into the fistula and the needle was exchanged for the a microsheath.  I then upsized to a 6 Fr Sheath and imaging was performed.  Hand injections were completed to image the access including the central venous system. This demonstrated Moderate stenosis in the 60 to 65% range at the anastomosis of the jump graft to the axillary vein.  The jump graft itself is widely patent and the remainder the central venous circulation was widely patent.  The original arteriovenous anastomosis was also patent.  Based on the images, this patient will need intervention to the stenosis of the anastomosis of the jump graft to the axillary vein. I then gave the patient 3000 units of intravenous heparin.  I then crossed the stenosis with a Magic Tourqe wire.  Based on the imaging, a 8 mm x 6 cm Lutonix drug-coated angioplasty balloon was selected.  The balloon was centered around the stenosis and inflated to 10 ATM for 1 minute(s).  On completion imaging, a 25-30% residual stenosis was present.     Based on the completion imaging, no further intervention is necessary.  The wire and balloon were removed from the sheath.  A 4-0 Monocryl purse-string suture was sewn around the sheath.  The sheath was  removed while tying down the suture.  A sterile bandage was applied to the puncture site.  COMPLICATIONS: None  CONDITION: Stable   Leotis Pain  05/19/2021 2:08 PM   This note was created with Dragon Medical transcription system. Any errors in dictation are purely  unintentional.

## 2021-05-19 NOTE — Anesthesia Postprocedure Evaluation (Signed)
Anesthesia Post Note  Patient: [name]  Procedure(s) Performed: A/V FISTULAGRAM (Left: Arm Upper)  Patient location: CV suite. Anesthesia Type: General Level of consciousness: awake and alert Pain management: pain level controlled Vital Signs Assessment: post-procedure vital signs reviewed and stable Respiratory status: spontaneous breathing, nonlabored ventilation and respiratory function stable Cardiovascular status: blood pressure returned to baseline and stable Postop Assessment: no apparent nausea or vomiting Anesthetic complications: no   Encounter Notable Events  Notable Event Outcome Phase Comment  None  Intraprocedure   None  Intraprocedure      Last Vitals:  Vitals:   05/19/21 1449 05/19/21 1500  BP:  136/82  Pulse:    Resp: 15 15  Temp:    SpO2: 100% 99%    Last Pain:  Vitals:   05/19/21 1500  PainSc: 0-No pain                 [place] Ouch

## 2021-05-20 ENCOUNTER — Encounter: Payer: Self-pay | Admitting: Vascular Surgery

## 2021-05-20 DIAGNOSIS — E782 Mixed hyperlipidemia: Secondary | ICD-10-CM | POA: Diagnosis not present

## 2021-05-20 DIAGNOSIS — I10 Essential (primary) hypertension: Secondary | ICD-10-CM | POA: Diagnosis not present

## 2021-05-20 DIAGNOSIS — I6389 Other cerebral infarction: Secondary | ICD-10-CM | POA: Diagnosis not present

## 2021-05-20 DIAGNOSIS — F17200 Nicotine dependence, unspecified, uncomplicated: Secondary | ICD-10-CM | POA: Diagnosis not present

## 2021-05-20 DIAGNOSIS — I340 Nonrheumatic mitral (valve) insufficiency: Secondary | ICD-10-CM | POA: Diagnosis not present

## 2021-05-20 DIAGNOSIS — I219 Acute myocardial infarction, unspecified: Secondary | ICD-10-CM | POA: Diagnosis not present

## 2021-05-20 DIAGNOSIS — I25810 Atherosclerosis of coronary artery bypass graft(s) without angina pectoris: Secondary | ICD-10-CM | POA: Diagnosis not present

## 2021-05-20 DIAGNOSIS — I509 Heart failure, unspecified: Secondary | ICD-10-CM | POA: Diagnosis not present

## 2021-05-20 DIAGNOSIS — I2510 Atherosclerotic heart disease of native coronary artery without angina pectoris: Secondary | ICD-10-CM | POA: Diagnosis not present

## 2021-05-21 DIAGNOSIS — N186 End stage renal disease: Secondary | ICD-10-CM | POA: Diagnosis not present

## 2021-05-21 DIAGNOSIS — Z992 Dependence on renal dialysis: Secondary | ICD-10-CM | POA: Diagnosis not present

## 2021-05-23 DIAGNOSIS — N186 End stage renal disease: Secondary | ICD-10-CM | POA: Diagnosis not present

## 2021-05-23 DIAGNOSIS — Z992 Dependence on renal dialysis: Secondary | ICD-10-CM | POA: Diagnosis not present

## 2021-05-24 DIAGNOSIS — Z992 Dependence on renal dialysis: Secondary | ICD-10-CM | POA: Diagnosis not present

## 2021-05-24 DIAGNOSIS — N186 End stage renal disease: Secondary | ICD-10-CM | POA: Diagnosis not present

## 2021-05-25 DIAGNOSIS — I509 Heart failure, unspecified: Secondary | ICD-10-CM | POA: Diagnosis not present

## 2021-05-26 NOTE — H&P (Signed)
[facility] Admission History & Physical  MRN : [id number]  [name] is a 60 y.o. (1960/11/24) female who presents with chief complaint of No chief complaint on file. Marland Kitchen  History of Present Illness: I am asked to evaluate the patient by the dialysis center. The patient was sent here because they were unable to achieve adequate dialysis this morning. Furthermore the Center states there is very poor thrill and bruit. The patient states there there have been increasing problems with the access, such as "pulling clots" during dialysis and prolonged bleeding after decannulation. The patient estimates these problems have been going on for several weeks. The patient is unaware of any other change.   Patient denies pain or tenderness overlying the access.  There is no pain with dialysis.  The patient denies hand pain or finger pain consistent with steal syndrome.    There have been many past interventions or declots of this access.  The patient is not chronically hypotensive on dialysis.  No current facility-administered medications for this encounter.   Current Outpatient Medications  Medication Sig Dispense Refill   amiodarone (PACERONE) 200 MG tablet Take 200 mg by mouth daily.     carvedilol (COREG) 3.125 MG tablet Take 3.125 mg by mouth 2 (two) times daily.     cinacalcet (SENSIPAR) 90 MG tablet Take 90 mg by mouth daily.     levothyroxine (SYNTHROID) 75 MCG tablet Take 75 mcg by mouth daily before breakfast.      lidocaine-prilocaine (EMLA) cream APPLY TO ACCESS SITE 30 MINUTES PRIOR TO DIALYSIS     midodrine (PROAMATINE) 10 MG tablet Take 10-20 mg by mouth daily. (for low BP associated with dialysis)     oxyCODONE-acetaminophen (PERCOCET/ROXICET) 5-325 MG tablet Take 1 tablet by mouth 2 (two) times daily as needed.     pantoprazole (PROTONIX) 40 MG tablet Take 40 mg by mouth daily.     rosuvastatin (CRESTOR) 20 MG tablet Take 20 mg by mouth at bedtime.      sevelamer carbonate (RENVELA) 800 MG tablet Take 2,400 mg by mouth 3 (three) times daily with meals. And 1 tablet with snacks     apixaban (ELIQUIS) 5 MG TABS tablet Take 5 mg by mouth 2 (two) times daily.     aspirin EC 81 MG tablet Take 81 mg by mouth daily. Swallow whole. (Patient not taking: Reported on 05/19/2021)     hydrOXYzine (ATARAX/VISTARIL) 50 MG tablet Take 50 mg by mouth 2 (two) times daily as needed for itching.      isosorbide mononitrate (IMDUR) 60 MG 24 hr tablet Take 60 mg by mouth daily. (Patient not taking: Reported on 05/19/2021)     nitroGLYCERIN (NITROSTAT) 0.4 MG SL tablet Place 1 tablet (0.4 mg total) under the tongue every 5 (five) minutes as needed for chest pain. (Patient not taking: Reported on 05/19/2021) 20 tablet 2   ranolazine (RANEXA) 1000 MG SR tablet Take 1 tablet (1,000 mg total) by mouth 2 (two) times daily. Increased from 500 mg 2 times daily. (Patient not taking: Reported on 05/19/2021) 60 tablet 2   sacubitril-valsartan (ENTRESTO) 49-51 MG Take 1 tablet by mouth 2 (two) times daily. (Patient not taking: Reported on 05/19/2021)     sucralfate (CARAFATE) 1 g tablet Take 1 g by mouth 4 (four) times daily -  with meals and at bedtime. (Patient not taking: Reported on 05/19/2021)     VELTASSA 8.4 g packet Take 1 packet by mouth daily. (Patient not  taking: Reported on 05/19/2021)     ZOFRAN 4 MG tablet Take 4 mg by mouth every 4 (four) hours as needed for nausea/vomiting. (Patient not taking: Reported on 05/19/2021)     Facility-Administered Medications Ordered in Other Encounters  Medication Dose Route Frequency Provider Last Rate Last Admin   sodium chloride flush (NS) 0.9 % injection 3 mL  3 mL Intravenous Q12H Neoma Laming A, MD   350 mL at 10/06/19 1212    Past Medical History:  Diagnosis Date   Adult behavior problems    frontal lobe CVA   Anemia    chronic disease   Cardiomyopathy ([place])    CHF (congestive heart failure) (HCC)    Chicken pox    Coronary  artery disease    GERD (gastroesophageal reflux disease)    Heart failure (H[facility])    Hepatitis    history of hep c   History of bipolar disorder    Hyperlipidemia    Hypertension    Myocardial infarction [facility])    Obesity    Paroxysmal atrial fibrillation ([place])    Peripheral vascular disease ([place])    Renal failure    Renal insufficiency    Stroke [facility]) 2011   Superficial incisional surgical site infection 03/14/2014    Past Surgical History:  Procedure Laterality Date   A/V FISTULAGRAM Left 03/30/2019   Procedure: A/V FISTULAGRAM;  Surgeon: Algernon Huxley, MD;  Location: [facility];  Service: Cardiovascular;  Laterality: Left;   A/V FISTULAGRAM Left 10/06/2019   Procedure: A/V FISTULAGRAM;  Surgeon: Algernon Huxley, MD;  Location: [place] [facility];  Service: Cardiovascular;  Laterality: Left;   A/V FISTULAGRAM Left 05/19/2021   Procedure: A/V FISTULAGRAM;  Surgeon: Algernon Huxley, MD;  Location: [place] [facility];  Service: Cardiovascular;  Laterality: Left;   COLONOSCOPY     COLONOSCOPY WITH PROPOFOL N/A 04/22/2021   Procedure: COLONOSCOPY WITH PROPOFOL;  Surgeon: Lesly Rubenstein, MD;  Location: [facility] ENDOSCOPY;  Service: Endoscopy;  Laterality: N/A;   CORONARY ANGIOPLASTY WITH STENT PLACEMENT  2013   CORONARY ARTERY BYPASS GRAFT     DIALYSIS FISTULA CREATION     ESOPHAGOGASTRODUODENOSCOPY     ESOPHAGOGASTRODUODENOSCOPY N/A 04/22/2021   Procedure: ESOPHAGOGASTRODUODENOSCOPY (EGD);  Surgeon: Lesly Rubenstein, MD;  Location: [facility] ENDOSCOPY;  Service: Endoscopy;  Laterality: N/A;   FLEXIBLE SIGMOIDOSCOPY N/A 02/23/2019   Procedure: FLEXIBLE SIGMOIDOSCOPY;  Surgeon: Lollie Sails, MD;  Location: [facility] ENDOSCOPY;  Service: Endoscopy;  Laterality: N/A;   LEFT HEART CATH AND CORS/GRAFTS ANGIOGRAPHY N/A 08/08/2018   Procedure: LEFT HEART CATH AND CORS/GRAFTS ANGIOGRAPHY;  Surgeon: Dionisio David, MD;  Location: [place] [facility];  Service: Cardiovascular;  Laterality:  N/A;   LEFT HEART CATH AND CORS/GRAFTS ANGIOGRAPHY N/A 01/30/2019   Procedure: LEFT HEART CATH AND CORS/GRAFTS ANGIOGRAPHY;  Surgeon: Dionisio David, MD;  Location: [place] [facility];  Service: Cardiovascular;  Laterality: N/A;   percutaneous insertion intra aortic balloon right cath     ultrasound guided pericardiocentesis       Social History   Tobacco Use   Smoking status: Former    Packs/day: 0.25    Years: 20.00    Pack years: 5.00    Types: Cigarettes   Smokeless tobacco: Never   Tobacco comments:    smokes one a day  Vaping Use   Vaping Use: Never used  Substance Use Topics   Alcohol use: No   Drug use: No  Family History  Problem Relation Age of Onset   Hypertension Mother    Ovarian cancer Mother    Diabetes type II Mother    Hypertension Father    Kidney disease Father    Hypertension Sister    Diabetes type II Maternal Grandmother    Breast cancer Maternal Grandmother    Breast cancer Maternal Aunt    Hypertension Other    Cancer Other    Renal Disease Other     No family history of bleeding or clotting disorders, autoimmune disease or porphyria  Allergies  Allergen Reactions   Morphine And Related Hives   Levaquin [Levofloxacin] Itching    Severe itching; prickly sensation   Other    Latex Rash   Tape Rash     REVIEW OF SYSTEMS (Negative unless checked)  Constitutional: [] Weight loss  [] Fever  [] Chills Cardiac: [] Chest pain   [] Chest pressure   [] Palpitations   [] Shortness of breath when laying flat   [] Shortness of breath at rest   [x] Shortness of breath with exertion. Vascular:  [] Pain in legs with walking   [] Pain in legs at rest   [] Pain in legs when laying flat   [] Claudication   [] Pain in feet when walking  [] Pain in feet at rest  [] Pain in feet when laying flat   [] History of DVT   [] Phlebitis   [] Swelling in legs   [] Varicose veins   [] Non-healing ulcers Pulmonary:   [] Uses home oxygen   [] Productive cough   [] Hemoptysis   [] Wheeze   [] COPD   [] Asthma Neurologic:  [] Dizziness  [] Blackouts   [] Seizures   [x] History of stroke   [] History of TIA  [] Aphasia   [] Temporary blindness   [] Dysphagia   [] Weakness or numbness in arms   [] Weakness or numbness in legs Musculoskeletal:  [] Arthritis   [] Joint swelling   [] Joint pain   [] Low back pain Hematologic:  [] Easy bruising  [] Easy bleeding   [] Hypercoagulable state   [x] Anemic  [] Hepatitis Gastrointestinal:  [] Blood in stool   [] Vomiting blood  [] Gastroesophageal reflux/heartburn   [] Difficulty swallowing. Genitourinary:  [x] Chronic kidney disease   [] Difficult urination  [] Frequent urination  [] Burning with urination   [] Blood in urine Skin:  [] Rashes   [] Ulcers   [] Wounds Psychological:  [] History of anxiety   []  History of major depression.  Physical Examination  Vitals:   05/19/21 1430 05/19/21 1445 05/19/21 1449 05/19/21 1500  BP: 123/83 134/75  136/82  Pulse: 96     Resp: 13 14 15 15   Temp:      SpO2:   100% 99%  Weight:      Height:       Body mass index is 25.34 kg/m. Gen: WD/WN, NAD Head: [place]/AT, No temporalis wasting.  Ear/Nose/Throat: Hearing grossly intact, nares w/o erythema or drainage, oropharynx w/o Erythema/Exudate,  Eyes: Conjunctiva clear, sclera non-icteric Neck: Trachea midline.  No JVD.  Pulmonary:  Good air movement, respirations not labored, no use of accessory muscles.  Cardiac: RRR, normal S1, S2. Vascular: AVF is somewhat pulsatile. Vessel Right Left  Radial Palpable Palpable   Musculoskeletal: M/S 5/5 throughout.  Extremities without ischemic changes.  No deformity or atrophy.  Neurologic: Sensation grossly intact in extremities.  Symmetrical.  Speech is fluent. Motor exam as listed above. Psychiatric: Judgment intact, Mood & affect appropriate for pt's clinical situation. Dermatologic: No rashes or ulcers noted.  No cellulitis or open wounds.    CBC Lab Results  Component Value Date   WBC 3.5 (  L) 03/13/2021   HGB 13.9 04/22/2021    HCT 41.0 04/22/2021   MCV 103.7 (H) 03/13/2021   PLT 112 (L) 03/13/2021    BMET    Component Value Date/Time   NA 135 04/22/2021 1400   NA 139 07/09/2014 0503   K 4.2 04/22/2021 1400   K 4.7 07/11/2014 0958   CL 105 04/22/2021 1400   CL 98 07/09/2014 0503   CO2 29 03/13/2021 1119   CO2 30 07/09/2014 0503   GLUCOSE 77 04/22/2021 1400   GLUCOSE 107 (H) 07/09/2014 0503   BUN 32 (H) 04/22/2021 1400   BUN 39 (H) 07/09/2014 0503   CREATININE 8.70 (H) 04/22/2021 1400   CREATININE 5.53 (H) 07/09/2014 0503   CALCIUM 8.8 (L) 03/13/2021 1119   CALCIUM 8.3 (L) 07/09/2014 0503   GFRNONAA 6 (L) 03/13/2021 1119   GFRNONAA 9 (L) 07/09/2014 0503   GFRNONAA 9 (L) 02/11/2014 0300   GFRAA 6 (L) 02/06/2020 0708   GFRAA 10 (L) 07/09/2014 0503   GFRAA 10 (L) 02/11/2014 0300   CrCl cannot be calculated (Patient's most recent lab result is older than the maximum 21 days allowed.).  COAG Lab Results  Component Value Date   INR 1.5 (H) 01/18/2019   INR 1.5 (H) 08/24/2018   INR 1.14 08/07/2018    Radiology PERIPHERAL VASCULAR CATHETERIZATION  Result Date: 05/19/2021 See surgical note for result.   Assessment/Plan 1.  Complication dialysis device with dysfunction of AV access:  Patient's dialysis access is malfunctioning. The patient will undergo angiography and correction of any problems using interventional techniques with the hope of restoring function to the access.  The risks and benefits were described to the patient.  All questions were answered.  The patient agrees to proceed with angiography and intervention. Potassium will be drawn to ensure that it is an appropriate level prior to performing intervention. 2.  End-stage renal disease requiring hemodialysis:  Patient will continue dialysis therapy without further interruption if a successful intervention is not achieved then a tunneled catheter will be placed. Dialysis has already been arranged. 3.  Hypertension:  Patient will  continue medical management; nephrology is following no changes in oral medications. 4. Diabetes mellitus:  Glucose will be monitored and oral medications been held this morning once the patient has undergone the patient's procedure po intake will be reinitiated and again Accu-Cheks will be used to assess the blood glucose level and treat as needed. The patient will be restarted on the patient's usual hypoglycemic regime 5.  Coronary artery disease:  EKG will be monitored. Nitrates will be used if needed. The patient's oral cardiac medications will be continued.    Leotis Pain, MD  05/26/2021 10:26 AM

## 2021-05-27 DIAGNOSIS — N186 End stage renal disease: Secondary | ICD-10-CM | POA: Diagnosis not present

## 2021-05-27 DIAGNOSIS — E875 Hyperkalemia: Secondary | ICD-10-CM | POA: Diagnosis not present

## 2021-05-27 DIAGNOSIS — Z992 Dependence on renal dialysis: Secondary | ICD-10-CM | POA: Diagnosis not present

## 2021-05-27 DIAGNOSIS — N2581 Secondary hyperparathyroidism of renal origin: Secondary | ICD-10-CM | POA: Diagnosis not present

## 2021-05-28 DIAGNOSIS — N186 End stage renal disease: Secondary | ICD-10-CM | POA: Diagnosis not present

## 2021-05-28 DIAGNOSIS — Z992 Dependence on renal dialysis: Secondary | ICD-10-CM | POA: Diagnosis not present

## 2021-05-30 DIAGNOSIS — N186 End stage renal disease: Secondary | ICD-10-CM | POA: Diagnosis not present

## 2021-05-30 DIAGNOSIS — Z992 Dependence on renal dialysis: Secondary | ICD-10-CM | POA: Diagnosis not present

## 2021-06-02 DIAGNOSIS — N186 End stage renal disease: Secondary | ICD-10-CM | POA: Diagnosis not present

## 2021-06-02 DIAGNOSIS — D509 Iron deficiency anemia, unspecified: Secondary | ICD-10-CM | POA: Diagnosis not present

## 2021-06-02 DIAGNOSIS — Z992 Dependence on renal dialysis: Secondary | ICD-10-CM | POA: Diagnosis not present

## 2021-06-04 DIAGNOSIS — Z992 Dependence on renal dialysis: Secondary | ICD-10-CM | POA: Diagnosis not present

## 2021-06-04 DIAGNOSIS — N186 End stage renal disease: Secondary | ICD-10-CM | POA: Diagnosis not present

## 2021-06-06 DIAGNOSIS — N186 End stage renal disease: Secondary | ICD-10-CM | POA: Diagnosis not present

## 2021-06-06 DIAGNOSIS — Z992 Dependence on renal dialysis: Secondary | ICD-10-CM | POA: Diagnosis not present

## 2021-06-07 ENCOUNTER — Other Ambulatory Visit: Payer: Self-pay

## 2021-06-07 ENCOUNTER — Emergency Department: Payer: Medicare Other

## 2021-06-07 DIAGNOSIS — Z7982 Long term (current) use of aspirin: Secondary | ICD-10-CM | POA: Insufficient documentation

## 2021-06-07 DIAGNOSIS — Z992 Dependence on renal dialysis: Secondary | ICD-10-CM | POA: Insufficient documentation

## 2021-06-07 DIAGNOSIS — Z951 Presence of aortocoronary bypass graft: Secondary | ICD-10-CM | POA: Diagnosis not present

## 2021-06-07 DIAGNOSIS — I132 Hypertensive heart and chronic kidney disease with heart failure and with stage 5 chronic kidney disease, or end stage renal disease: Secondary | ICD-10-CM | POA: Insufficient documentation

## 2021-06-07 DIAGNOSIS — I2510 Atherosclerotic heart disease of native coronary artery without angina pectoris: Secondary | ICD-10-CM | POA: Insufficient documentation

## 2021-06-07 DIAGNOSIS — I509 Heart failure, unspecified: Secondary | ICD-10-CM | POA: Diagnosis not present

## 2021-06-07 DIAGNOSIS — Z7901 Long term (current) use of anticoagulants: Secondary | ICD-10-CM | POA: Diagnosis not present

## 2021-06-07 DIAGNOSIS — S0990XA Unspecified injury of head, initial encounter: Secondary | ICD-10-CM | POA: Insufficient documentation

## 2021-06-07 DIAGNOSIS — E1122 Type 2 diabetes mellitus with diabetic chronic kidney disease: Secondary | ICD-10-CM | POA: Diagnosis not present

## 2021-06-07 DIAGNOSIS — I480 Paroxysmal atrial fibrillation: Secondary | ICD-10-CM | POA: Diagnosis not present

## 2021-06-07 DIAGNOSIS — Z79899 Other long term (current) drug therapy: Secondary | ICD-10-CM | POA: Insufficient documentation

## 2021-06-07 DIAGNOSIS — Z87891 Personal history of nicotine dependence: Secondary | ICD-10-CM | POA: Insufficient documentation

## 2021-06-07 DIAGNOSIS — S199XXA Unspecified injury of neck, initial encounter: Secondary | ICD-10-CM | POA: Diagnosis not present

## 2021-06-07 DIAGNOSIS — E039 Hypothyroidism, unspecified: Secondary | ICD-10-CM | POA: Diagnosis not present

## 2021-06-07 DIAGNOSIS — N186 End stage renal disease: Secondary | ICD-10-CM | POA: Diagnosis not present

## 2021-06-07 DIAGNOSIS — Z91040 Latex allergy status: Secondary | ICD-10-CM | POA: Diagnosis not present

## 2021-06-07 DIAGNOSIS — W19XXXA Unspecified fall, initial encounter: Secondary | ICD-10-CM | POA: Insufficient documentation

## 2021-06-07 NOTE — ED Triage Notes (Signed)
Pt was sitting on side of bed, fell asleep and fell head first onto floor (tile floor).  Pt denies LOC, nausea or vomiting.  Pt states she previously had head bleed from similar situation.

## 2021-06-08 ENCOUNTER — Emergency Department
Admission: EM | Admit: 2021-06-08 | Discharge: 2021-06-08 | Disposition: A | Discharge status: Home / Self Care | Payer: Medicare Other | Attending: Emergency Medicine | Admitting: Emergency Medicine

## 2021-06-08 DIAGNOSIS — S0990XA Unspecified injury of head, initial encounter: Secondary | ICD-10-CM | POA: Diagnosis not present

## 2021-06-08 DIAGNOSIS — W19XXXA Unspecified fall, initial encounter: Secondary | ICD-10-CM

## 2021-06-08 MED ORDER — OXYCODONE-ACETAMINOPHEN 5-325 MG PO TABS
1.0000 | ORAL_TABLET | Freq: Once | ORAL | Status: AC
  Administered 2021-06-08: 01:00:00 1 via ORAL
  Filled 2021-06-08: qty 1

## 2021-06-08 NOTE — ED Provider Notes (Signed)
[facility] Emergency Department Provider Note   ____________________________________________   Event Date/Time   First MD Initiated Contact with Patient 06/08/21 0115     (approximate)  I have reviewed the triage vital signs and the nursing notes.   HISTORY  Chief Complaint Fall    HPI [name] is a 60 y.o. female who presents to the ED from home with a chief complaint of minor head injury.  Patient was sitting on the side of her low-lying bed, fell asleep and fell headfirst onto the tile floor.  Patient denies LOC.  She does take Eliquis.  Denies vision changes, neck pain, chest pain, shortness of breath, abdominal pain, nausea, vomiting or dizziness.     Past Medical History:  Diagnosis Date   Adult behavior problems    frontal lobe CVA   Anemia    chronic disease   Cardiomyopathy ([place])    CHF (congestive heart failure) (HCC)    Chicken pox    Coronary artery disease    GERD (gastroesophageal reflux disease)    Heart failure (HCC)    Hepatitis    history of hep c   History of bipolar disorder    Hyperlipidemia    Hypertension    Myocardial infarction [facility])    Obesity    Paroxysmal atrial fibrillation ([place])    Peripheral vascular disease ([place])    Renal failure    Renal insufficiency    Stroke [facility]) 2011   Superficial incisional surgical site infection 03/14/2014    Patient Active Problem List   Diagnosis Date Noted   Acute colitis 02/01/2021   Paroxysmal atrial fibrillation ([place])    Hypothyroidism    Subdural hematoma 07/25/2020   Syncope and collapse 07/25/2020   ESRD (end stage renal disease) ([place]) 01/30/2019   Unstable angina ([place]) 01/23/2019   Non-STEMI (non-ST elevated myocardial infarction) ([place]) 08/07/2018   Pneumonia 01/31/2018   Swelling of limb 09/22/2016   Kidney dialysis as the cause of abnormal reaction of the patient, or of later complication, without mention of misadventure at the time of the procedure  (CODE) 03/24/2016   Hyperkalemia 11/13/2014   Generalized weakness 10/15/2014   Chest pain 10/15/2014   ESRD on hemodialysis ([place]) 10/15/2014   Congestive heart failure (CHF) ([place]) 10/15/2014   HTN (hypertension) 10/15/2014   Sternal wound dehiscence 04/12/2014   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   Pericardial effusion 04/12/2014   History of delirium 04/12/2014   H/O atrial flutter 04/12/2014   Delayed surgical wound healing 04/12/2014   Chest wall pain following surgery 04/12/2014   S/P CABG (coronary artery bypass graft) 04/02/2014   Lactic acidosis 03/26/2014   Arteriosclerotic dementia ([place]) 03/25/2014   Atrial flutter by electrocardiogram ([place]) 03/22/2014   Superficial incisional surgical site infection 03/14/2014   Postoperative anemia due to acute blood loss 02/27/2014   Obesity 02/27/2014   H/O: substance abuse ([place]) 02/27/2014   Anemia of chronic renal failure 02/27/2014   Acute postoperative pain 02/27/2014   Leukocytosis 02/26/2014   Hypotension 02/24/2014   Exhausted vascular access 02/24/2014   Anemia of chronic disease 02/24/2014   Stroke ([place]) 02/21/2014   S/P CABG x 3 02/21/2014   Dialysis patient ([place]) 02/21/2014   Hyperlipidemia 02/13/2014   History of hepatitis C 02/13/2014   HFrEF (heart failure with reduced ejection fraction) ([place]) 02/13/2014   H/O stroke without residual deficits 02/13/2014   ESRD (end stage renal disease) on dialysis ([place]) 02/13/2014   End stage renal disease with  hypertension ([place]) 02/13/2014   CAD, multiple vessel 02/13/2014   Retrosternal chest pain 05/30/2013   Gallstones 05/29/2013    Past Surgical History:  Procedure Laterality Date   A/V FISTULAGRAM Left 03/30/2019   Procedure: A/V FISTULAGRAM;  Surgeon: Algernon Huxley, MD;  Location: [place] [facility];  Service: Cardiovascular;  Laterality: Left;   A/V FISTULAGRAM Left 10/06/2019   Procedure: A/V FISTULAGRAM;  Surgeon: Algernon Huxley, MD;  Location: [place] [facility]  [facility];  Service: Cardiovascular;  Laterality: Left;   A/V FISTULAGRAM Left 05/19/2021   Procedure: A/V FISTULAGRAM;  Surgeon: Algernon Huxley, MD;  Location: [place] [facility];  Service: Cardiovascular;  Laterality: Left;   COLONOSCOPY     COLONOSCOPY WITH PROPOFOL N/A 04/22/2021   Procedure: COLONOSCOPY WITH PROPOFOL;  Surgeon: Lesly Rubenstein, MD;  Location: [facility] ENDOSCOPY;  Service: Endoscopy;  Laterality: N/A;   CORONARY ANGIOPLASTY WITH STENT PLACEMENT  2013   CORONARY ARTERY BYPASS GRAFT     DIALYSIS FISTULA CREATION     ESOPHAGOGASTRODUODENOSCOPY     ESOPHAGOGASTRODUODENOSCOPY N/A 04/22/2021   Procedure: ESOPHAGOGASTRODUODENOSCOPY (EGD);  Surgeon: Lesly Rubenstein, MD;  Location[facility] ENDOSCOPY;  Service: Endoscopy;  Laterality: N/A;   FLEXIBLE SIGMOIDOSCOPY N/A 02/23/2019   Procedure: FLEXIBLE SIGMOIDOSCOPY;  Surgeon: Lollie Sails, MD;  Location: [facility] ENDOSCOPY;  Service: Endoscopy;  Laterality: N/A;   LEFT HEART CATH AND CORS/GRAFTS ANGIOGRAPHY N/A 08/08/2018   Procedure: LEFT HEART CATH AND CORS/GRAFTS ANGIOGRAPHY;  Surgeon: Dionisio David, MD;  Location: [place] [facility];  Service: Cardiovascular;  Laterality: N/A;   LEFT HEART CATH AND CORS/GRAFTS ANGIOGRAPHY N/A 01/30/2019   Procedure: LEFT HEART CATH AND CORS/GRAFTS ANGIOGRAPHY;  Surgeon: Dionisio David, MD;  Location: [place] [facility];  Service: Cardiovascular;  Laterality: N/A;   percutaneous insertion intra aortic balloon right cath     ultrasound guided pericardiocentesis      Prior to Admission medications   Medication Sig Start Date End Date Taking? Authorizing Provider  amiodarone (PACERONE) 200 MG tablet Take 200 mg by mouth daily. 07/30/20   [provider]  apixaban (ELIQUIS) 5 MG TABS tablet Take 5 mg by mouth 2 (two) times daily. 09/11/19   [provider]  aspirin EC 81 MG tablet Take 81 mg by mouth daily. Swallow whole. Patient not taking: Reported on 05/19/2021    [provider]  carvedilol (COREG) 3.125 MG tablet Take 3.125 mg by mouth 2 (two) times daily. 10/14/20   [provider]  cinacalcet (SENSIPAR) 90 MG tablet Take 90 mg by mouth daily.    [provider]  hydrOXYzine (ATARAX/VISTARIL) 50 MG tablet Take 50 mg by mouth 2 (two) times daily as needed for itching.     [provider]  isosorbide mononitrate (IMDUR) 60 MG 24 hr tablet Take 60 mg by mouth daily. Patient not taking: Reported on 05/19/2021    [provider]  levothyroxine (SYNTHROID) 75 MCG tablet Take 75 mcg by mouth daily before breakfast.  11/02/18   [provider]  lidocaine-prilocaine (EMLA) cream APPLY TO ACCESS SITE 30 MINUTES PRIOR TO DIALYSIS 04/08/19   [provider]  midodrine (PROAMATINE) 10 MG tablet Take 10-20 mg by mouth daily. (for low BP associated with dialysis)    [provider]  nitroGLYCERIN (NITROSTAT) 0.4 MG SL tablet Place 1 tablet (0.4 mg total) under the tongue every 5 (five) minutes as needed for chest pain. Patient not taking: Reported on 05/19/2021 03/13/21  Enzo Bi, MD  oxyCODONE-acetaminophen (PERCOCET/ROXICET) 5-325 MG tablet Take 1 tablet by mouth 2 (two) times daily as needed. 02/08/21   [provider]  pantoprazole (PROTONIX) 40 MG tablet Take 40 mg by mouth daily.    [provider]  ranolazine (RANEXA) 1000 MG SR tablet Take 1 tablet (1,000 mg total) by mouth 2 (two) times daily. Increased from 500 mg 2 times daily. Patient not taking: Reported on 05/19/2021 03/13/21 06/11/21  Enzo Bi, MD  rosuvastatin (CRESTOR) 20 MG tablet Take 20 mg by mouth at bedtime. 09/02/20   [provider]  sacubitril-valsartan (ENTRESTO) 49-51 MG Take 1 tablet by mouth 2 (two) times daily. Patient not taking: Reported on 05/19/2021    [provider]  sevelamer carbonate (RENVELA) 800 MG tablet Take 2,400 mg by mouth 3 (three) times daily with meals. And 1 tablet with snacks     [provider]  sucralfate (CARAFATE) 1 g tablet Take 1 g by mouth 4 (four) times daily -  with meals and at bedtime. Patient not taking: Reported on 05/19/2021    [provider]  VELTASSA 8.4 g packet Take 1 packet by mouth daily. Patient not taking: Reported on 05/19/2021 01/27/21   [provider]  ZOFRAN 4 MG tablet Take 4 mg by mouth every 4 (four) hours as needed for nausea/vomiting. Patient not taking: Reported on 05/19/2021 05/20/20   [provider]    Allergies Morphine and related, Levaquin [levofloxacin], Other, Latex, and Tape  Family History  Problem Relation Age of Onset   Hypertension Mother    Ovarian cancer Mother    Diabetes type II Mother    Hypertension Father    Kidney disease Father    Hypertension Sister    Diabetes type II Maternal Grandmother    Breast cancer Maternal Grandmother    Breast cancer Maternal Aunt    Hypertension Other    Cancer Other    Renal Disease Other     Social History Social History   Tobacco Use   Smoking status: Former    Packs/day: 0.25    Years: 20.00    Pack years: 5.00    Types: Cigarettes   Smokeless tobacco: Never   Tobacco comments:    smokes one a day  Vaping Use   Vaping Use: Never used  Substance Use Topics   Alcohol use: No   Drug use: No    Review of Systems  Constitutional: No fever/chills Eyes: No visual changes. ENT: No sore throat. Cardiovascular: Denies chest pain. Respiratory: Denies shortness of breath. Gastrointestinal: No abdominal pain.  No nausea, no vomiting.  No diarrhea.  No constipation. Genitourinary: Negative for dysuria. Musculoskeletal: Negative for back pain. Skin: Negative for rash. Neurological: Positive for minor head injury.  Negative for headaches, focal weakness or numbness.   ____________________________________________   PHYSICAL EXAM:  VITAL SIGNS: ED Triage Vitals  Enc Vitals Group     BP 06/07/21 2114 (!) (P) 111/96      Pulse Rate 06/07/21 2114 (P) 68     Resp 06/07/21 2114 (P) 18     Temp 06/07/21 2114 (P) 98.3 F (36.8 C)     Temp Source 06/07/21 2114 (P) Oral     SpO2 06/07/21 2117 97 %     Weight 06/07/21 2110 160 lb (72.6 kg)     Height 06/07/21 2110 5\' 6"  (1.676 m)     Head Circumference --      Peak Flow --  Pain Score 06/07/21 2110 1     Pain Loc --      Pain Edu? --      Excl. in [place]? --     Constitutional: Alert and oriented. Well appearing and in no acute distress. Eyes: Conjunctivae are normal. PERRL. EOMI. Head: Atraumatic.  Left parietal scalp tender to palpation. Nose: Atraumatic. Mouth/Throat: Mucous membranes are moist.  No dental malocclusion. Neck: No stridor.  No cervical spine tenderness to palpation. Cardiovascular: Normal rate, regular rhythm. Grossly normal heart sounds.  Good peripheral circulation. Respiratory: Normal respiratory effort.  No retractions. Lungs CTAB. Gastrointestinal: Soft and nontender. No distention. No abdominal bruits. No CVA tenderness. Musculoskeletal: No lower extremity tenderness nor edema.  No joint effusions. Neurologic: Alert and oriented x3.  CN II to XII grossly intact.  Normal speech and language. No gross focal neurologic deficits are appreciated. No gait instability. Skin:  Skin is warm, dry and intact. No rash noted. Psychiatric: Mood and affect are normal. Speech and behavior are normal.  ____________________________________________   LABS (all labs ordered are listed, but only abnormal results are displayed)  Labs Reviewed - No data to display ____________________________________________  EKG  None ____________________________________________  RADIOLOGY I, Murrel Bertram J, personally viewed and evaluated these images (plain radiographs) as part of my medical decision making, as well as reviewing the written report by the radiologist.  ED MD interpretation: No ICH, no cervical spine injury  Official radiology report(s): CT  HEAD WO CONTRAST (5MM)  Result Date: 06/07/2021 CLINICAL DATA:  Trauma. EXAM: CT HEAD WITHOUT CONTRAST CT CERVICAL SPINE WITHOUT CONTRAST TECHNIQUE: Multidetector CT imaging of the head and cervical spine was performed following the standard protocol without intravenous contrast. Multiplanar CT image reconstructions of the cervical spine were also generated. COMPARISON:  None. FINDINGS: CT HEAD FINDINGS Brain: Mild age-related atrophy and moderate microvascular ischemic changes. Bilateral with hypodensities may represent ischemic changes versus white-matter disease. There is no acute intracranial hemorrhage. No effect or midline shift. No extra-axial fluid collection. Vascular: No hyperdense vessel or unexpected calcification. Skull: Normal. Negative for fracture or focal lesion. Sinuses/Orbits: No acute finding. Other: None CT CERVICAL SPINE FINDINGS Alignment: No acute subluxation. There is reversal normal cervical lordosis which may be positional or due to muscle spasm or secondary to degenerative changes. Skull base and vertebrae: No acute fracture. Soft tissues and spinal canal: No prevertebral fluid or swelling. No visible canal hematoma. Disc levels: Multilevel degenerative changes with disc space narrowing and endplate irregularity. Upper chest: Negative. Other: Bilateral carotid plaques. IMPRESSION: 1. No acute intracranial pathology. 2. Age-related atrophy and microvascular ischemic changes. Bilateral periventricular hypodensities may represent ischemic changes versus white-matter disease. 3. No acute/traumatic cervical spine pathology. Multilevel degenerative changes. Electronically Signed   By: Anner Crete M.D.   On: 06/07/2021 22:22   CT CERVICAL SPINE WO CONTRAST  Result Date: 06/07/2021 CLINICAL DATA:  Trauma. EXAM: CT HEAD WITHOUT CONTRAST CT CERVICAL SPINE WITHOUT CONTRAST TECHNIQUE: Multidetector CT imaging of the head and cervical spine was performed following the standard protocol  without intravenous contrast. Multiplanar CT image reconstructions of the cervical spine were also generated. COMPARISON:  None. FINDINGS: CT HEAD FINDINGS Brain: Mild age-related atrophy and moderate microvascular ischemic changes. Bilateral with hypodensities may represent ischemic changes versus white-matter disease. There is no acute intracranial hemorrhage. No effect or midline shift. No extra-axial fluid collection. Vascular: No hyperdense vessel or unexpected calcification. Skull: Normal. Negative for fracture or focal lesion. Sinuses/Orbits: No acute finding. Other: None CT CERVICAL SPINE FINDINGS  Alignment: No acute subluxation. There is reversal normal cervical lordosis which may be positional or due to muscle spasm or secondary to degenerative changes. Skull base and vertebrae: No acute fracture. Soft tissues and spinal canal: No prevertebral fluid or swelling. No visible canal hematoma. Disc levels: Multilevel degenerative changes with disc space narrowing and endplate irregularity. Upper chest: Negative. Other: Bilateral carotid plaques. IMPRESSION: 1. No acute intracranial pathology. 2. Age-related atrophy and microvascular ischemic changes. Bilateral periventricular hypodensities may represent ischemic changes versus white-matter disease. 3. No acute/traumatic cervical spine pathology. Multilevel degenerative changes. Electronically Signed   By: Anner Crete M.D.   On: 06/07/2021 22:22    ____________________________________________   PROCEDURES  Procedure(s) performed (including Critical Care):  Procedures   ____________________________________________   INITIAL IMPRESSION / ASSESSMENT AND PLAN / ED COURSE  As part of my medical decision making, I reviewed the following data within the [place] History obtained from family, Nursing notes reviewed and incorporated, Radiograph reviewed, and Notes from prior ED visits     60 year old female presenting with  minor head injury, on Eliquis.  Differential diagnosis includes but is not limited to [place], SDH, SAH, cervical spine injury, etc.  CT imaging studies unremarkable for acute bleed or osseous injury.  Percocet given for head pain.  Strict return precautions given.  Patient and daughter verbalized understanding and agree with plan of care.      ____________________________________________   FINAL CLINICAL IMPRESSION(S) / ED DIAGNOSES  Final diagnoses:  Fall, initial encounter  Minor head injury, initial encounter     ED Discharge Orders     None        Note:  This document was prepared using Dragon voice recognition software and may include unintentional dictation errors.    Paulette Blanch, MD 06/08/21 [phone]

## 2021-06-08 NOTE — Discharge Instructions (Signed)
You may take Tylenol as needed for headache.  Return to the ER for worsening symptoms, persistent vomiting, lethargy or other concerns.

## 2021-06-09 DIAGNOSIS — N186 End stage renal disease: Secondary | ICD-10-CM | POA: Diagnosis not present

## 2021-06-09 DIAGNOSIS — Z992 Dependence on renal dialysis: Secondary | ICD-10-CM | POA: Diagnosis not present

## 2021-06-11 DIAGNOSIS — N186 End stage renal disease: Secondary | ICD-10-CM | POA: Diagnosis not present

## 2021-06-11 DIAGNOSIS — Z992 Dependence on renal dialysis: Secondary | ICD-10-CM | POA: Diagnosis not present

## 2021-06-13 DIAGNOSIS — Z992 Dependence on renal dialysis: Secondary | ICD-10-CM | POA: Diagnosis not present

## 2021-06-13 DIAGNOSIS — N186 End stage renal disease: Secondary | ICD-10-CM | POA: Diagnosis not present

## 2021-06-14 DIAGNOSIS — Z992 Dependence on renal dialysis: Secondary | ICD-10-CM | POA: Diagnosis not present

## 2021-06-14 DIAGNOSIS — N186 End stage renal disease: Secondary | ICD-10-CM | POA: Diagnosis not present

## 2021-06-15 DIAGNOSIS — N186 End stage renal disease: Secondary | ICD-10-CM | POA: Diagnosis not present

## 2021-06-15 DIAGNOSIS — Z992 Dependence on renal dialysis: Secondary | ICD-10-CM | POA: Diagnosis not present

## 2021-06-16 DIAGNOSIS — N2581 Secondary hyperparathyroidism of renal origin: Secondary | ICD-10-CM | POA: Diagnosis not present

## 2021-06-16 DIAGNOSIS — Z992 Dependence on renal dialysis: Secondary | ICD-10-CM | POA: Diagnosis not present

## 2021-06-16 DIAGNOSIS — N186 End stage renal disease: Secondary | ICD-10-CM | POA: Diagnosis not present

## 2021-06-18 DIAGNOSIS — Z992 Dependence on renal dialysis: Secondary | ICD-10-CM | POA: Diagnosis not present

## 2021-06-18 DIAGNOSIS — N186 End stage renal disease: Secondary | ICD-10-CM | POA: Diagnosis not present

## 2021-06-20 DIAGNOSIS — E875 Hyperkalemia: Secondary | ICD-10-CM | POA: Diagnosis not present

## 2021-06-20 DIAGNOSIS — Z992 Dependence on renal dialysis: Secondary | ICD-10-CM | POA: Diagnosis not present

## 2021-06-20 DIAGNOSIS — N186 End stage renal disease: Secondary | ICD-10-CM | POA: Diagnosis not present

## 2021-06-23 DIAGNOSIS — N2581 Secondary hyperparathyroidism of renal origin: Secondary | ICD-10-CM | POA: Diagnosis not present

## 2021-06-23 DIAGNOSIS — Z992 Dependence on renal dialysis: Secondary | ICD-10-CM | POA: Diagnosis not present

## 2021-06-23 DIAGNOSIS — N186 End stage renal disease: Secondary | ICD-10-CM | POA: Diagnosis not present

## 2021-06-23 DIAGNOSIS — E875 Hyperkalemia: Secondary | ICD-10-CM | POA: Diagnosis not present

## 2021-06-24 ENCOUNTER — Ambulatory Visit (INDEPENDENT_AMBULATORY_CARE_PROVIDER_SITE_OTHER): Payer: Medicare Other

## 2021-06-24 ENCOUNTER — Other Ambulatory Visit: Payer: Self-pay

## 2021-06-24 ENCOUNTER — Encounter (INDEPENDENT_AMBULATORY_CARE_PROVIDER_SITE_OTHER): Payer: Self-pay | Admitting: Nurse Practitioner

## 2021-06-24 ENCOUNTER — Ambulatory Visit (INDEPENDENT_AMBULATORY_CARE_PROVIDER_SITE_OTHER): Payer: Medicare Other | Admitting: Nurse Practitioner

## 2021-06-24 VITALS — BP 88/62 | HR 62 | Resp 16 | Ht 60.0 in | Wt 152.0 lb

## 2021-06-24 DIAGNOSIS — E785 Hyperlipidemia, unspecified: Secondary | ICD-10-CM

## 2021-06-24 DIAGNOSIS — N186 End stage renal disease: Secondary | ICD-10-CM

## 2021-06-24 DIAGNOSIS — I10 Essential (primary) hypertension: Secondary | ICD-10-CM

## 2021-06-24 NOTE — Progress Notes (Signed)
Subjective:    Patient ID: [name], female    DOB: 1960-11-25, 61 y.o.   MRN: [id number] Chief Complaint  Patient presents with   Follow-up    ultrasound    The patient returns to the office for followup status post intervention of the dialysis access left jump graft. Following the intervention the access function has significantly improved, with better flow rates and improved KT/V. The patient has not been experiencing increased bleeding times following decannulation and the patient denies increased recirculation. The patient denies an increase in arm swelling. At the present time the patient denies hand pain.  The patient denies amaurosis fugax or recent TIA symptoms. There are no recent neurological changes noted. The patient denies claudication symptoms or rest pain symptoms. The patient denies history of DVT, PE or superficial thrombophlebitis. The patient denies recent episodes of angina or shortness of breath.   Today the patient has a flow volume of 2639 with no obvious areas of stenosis.       Review of Systems  Hematological:  Does not bruise/bleed easily.  All other systems reviewed and are negative.     Objective:   Physical Exam Vitals reviewed.  HENT:     Head: Normocephalic.  Cardiovascular:     Rate and Rhythm: Normal rate.     Pulses:          Radial pulses are 2+ on the left side.     Arteriovenous access: Left arteriovenous access is present.    Comments: Good thrill and bruit Pulmonary:     Effort: Pulmonary effort is normal.  Skin:    General: Skin is warm and dry.  Neurological:     Mental Status: She is alert and oriented to person, place, and time.  Psychiatric:        Mood and Affect: Mood normal.        Behavior: Behavior normal.        Thought Content: Thought content normal.        Judgment: Judgment normal.    BP (!) 88/62 (BP Location: Right Arm)    Pulse 62    Resp 16    Ht 5' (1.524 m)    Wt 152 lb (68.9 kg)    BMI 29.69  kg/m   Past Medical History:  Diagnosis Date   Adult behavior problems    frontal lobe CVA   Anemia    chronic disease   Cardiomyopathy (HCC)    CHF (congestive heart failure) (HCC)    Chicken pox    Coronary artery disease    GERD (gastroesophageal reflux disease)    Heart failure (HCC)    Hepatitis    history of hep c   History of bipolar disorder    Hyperlipidemia    Hypertension    Myocardial infarction [facility])    Obesity    Paroxysmal atrial fibrillation (HCC)    Peripheral vascular disease (HCC)    Renal failure    Renal insufficiency    Stroke [facility]) 2011   Superficial incisional surgical site infection 03/14/2014    Social History   Socioeconomic History   Marital status: Single    Spouse name: Not on file   Number of children: Not on file   Years of education: Not on file   Highest education level: Not on file  Occupational History   Not on file  Tobacco Use   Smoking status: Former    Packs/day: 0.25    Years: 20.00  Pack years: 5.00    Types: Cigarettes   Smokeless tobacco: Never   Tobacco comments:    smokes one a day  Vaping Use   Vaping Use: Never used  Substance and Sexual Activity   Alcohol use: No   Drug use: No   Sexual activity: Not Currently    Birth control/protection: Post-menopausal  Other Topics Concern   Not on file  Social History Narrative   Not on file   Social Determinants of Health   Financial Resource Strain: Not on file  Food Insecurity: Not on file  Transportation Needs: Not on file  Physical Activity: Not on file  Stress: Not on file  Social Connections: Not on file  Intimate Partner Violence: Not on file    Past Surgical History:  Procedure Laterality Date   A/V FISTULAGRAM Left 03/30/2019   Procedure: A/V FISTULAGRAM;  Surgeon: Algernon Huxley, MD;  Location: [place] CV LAB;  Service: Cardiovascular;  Laterality: Left;   A/V FISTULAGRAM Left 10/06/2019   Procedure: A/V FISTULAGRAM;  Surgeon: Algernon Huxley, MD;  Location: [place] CV LAB;  Service: Cardiovascular;  Laterality: Left;   A/V FISTULAGRAM Left 05/19/2021   Procedure: A/V FISTULAGRAM;  Surgeon: Algernon Huxley, MD;  Location: [place] CV LAB;  Service: Cardiovascular;  Laterality: Left;   COLONOSCOPY     COLONOSCOPY WITH PROPOFOL N/A 04/22/2021   Procedure: COLONOSCOPY WITH PROPOFOL;  Surgeon: Lesly Rubenstein, MD;  Location: [facility] ENDOSCOPY;  Service: Endoscopy;  Laterality: N/A;   CORONARY ANGIOPLASTY WITH STENT PLACEMENT  2013   CORONARY ARTERY BYPASS GRAFT     DIALYSIS FISTULA CREATION     ESOPHAGOGASTRODUODENOSCOPY     ESOPHAGOGASTRODUODENOSCOPY N/A 04/22/2021   Procedure: ESOPHAGOGASTRODUODENOSCOPY (EGD);  Surgeon: Lesly Rubenstein, MD;  Location: [facility] ENDOSCOPY;  Service: Endoscopy;  Laterality: N/A;   FLEXIBLE SIGMOIDOSCOPY N/A 02/23/2019   Procedure: FLEXIBLE SIGMOIDOSCOPY;  Surgeon: Lollie Sails, MD;  Location: [facility] ENDOSCOPY;  Service: Endoscopy;  Laterality: N/A;   LEFT HEART CATH AND CORS/GRAFTS ANGIOGRAPHY N/A 08/08/2018   Procedure: LEFT HEART CATH AND CORS/GRAFTS ANGIOGRAPHY;  Surgeon: Dionisio David, MD;  Location: [place] CV LAB;  Service: Cardiovascular;  Laterality: N/A;   LEFT HEART CATH AND CORS/GRAFTS ANGIOGRAPHY N/A 01/30/2019   Procedure: LEFT HEART CATH AND CORS/GRAFTS ANGIOGRAPHY;  Surgeon: Dionisio David, MD;  Location: [place] CV LAB;  Service: Cardiovascular;  Laterality: N/A;   percutaneous insertion intra aortic balloon right cath     ultrasound guided pericardiocentesis      Family History  Problem Relation Age of Onset   Hypertension Mother    Ovarian cancer Mother    Diabetes type II Mother    Hypertension Father    Kidney disease Father    Hypertension Sister    Diabetes type II Maternal Grandmother    Breast cancer Maternal Grandmother    Breast cancer Maternal Aunt    Hypertension Other    Cancer Other    Renal Disease Other     Allergies  Allergen  Reactions   Morphine And Related Hives   Levaquin [Levofloxacin] Itching    Severe itching; prickly sensation   Other    Latex Rash   Tape Rash    CBC Latest Ref Rng & Units 04/22/2021 03/13/2021 03/11/2021  WBC 4.0 - 10.5 K/uL - 3.5(L) 5.3  Hemoglobin 12.0 - 15.0 g/dL 13.9 14.6 15.1(H)  Hematocrit 36.0 - 46.0 % 41.0 45.3 45.5  Platelets 150 - 400 K/uL -  112(L) 136(L)      CMP     Component Value Date/Time   NA 135 04/22/2021 1400   NA 139 07/09/2014 0503   K 4.2 04/22/2021 1400   K 4.7 07/11/2014 0958   CL 105 04/22/2021 1400   CL 98 07/09/2014 0503   CO2 29 03/13/2021 1119   CO2 30 07/09/2014 0503   GLUCOSE 77 04/22/2021 1400   GLUCOSE 107 (H) 07/09/2014 0503   BUN 32 (H) 04/22/2021 1400   BUN 39 (H) 07/09/2014 0503   CREATININE 8.70 (H) 04/22/2021 1400   CREATININE 5.53 (H) 07/09/2014 0503   CALCIUM 8.8 (L) 03/13/2021 1119   CALCIUM 8.3 (L) 07/09/2014 0503   PROT 8.8 (H) 03/11/2021 1809   PROT 8.4 (H) 07/07/2014 2207   ALBUMIN 4.4 03/11/2021 1809   ALBUMIN 3.1 (L) 07/07/2014 2207   AST 23 03/11/2021 1809   AST 76 (H) 07/07/2014 2207   ALT 16 03/11/2021 1809   ALT 41 07/07/2014 2207   ALKPHOS 135 (H) 03/11/2021 1809   ALKPHOS 107 07/07/2014 2207   BILITOT 1.0 03/11/2021 1809   BILITOT 0.7 07/07/2014 2207   GFRNONAA 6 (L) 03/13/2021 1119   GFRNONAA 9 (L) 07/09/2014 0503   GFRNONAA 9 (L) 02/11/2014 0300   GFRAA 6 (L) 02/06/2020 0708   GFRAA 10 (L) 07/09/2014 0503   GFRAA 10 (L) 02/11/2014 0300     No results found.     Assessment & Plan:   1. ESRD (end stage renal disease) ([place]) Recommend:  The patient is doing well and currently has adequate dialysis access. The patient's dialysis center is not reporting any access issues. Flow pattern is stable when compared to the prior ultrasound.  The patient should have a duplex ultrasound of the dialysis access in 3 months. The patient will follow-up with me in the office after each ultrasound     2.  Hyperlipidemia, unspecified hyperlipidemia type Continue statin as ordered and reviewed, no changes at this time   3. Primary hypertension Continue antihypertensive medications as already ordered, these medications have been reviewed and there are no changes at this time.     Current Outpatient Medications on File Prior to Visit  Medication Sig Dispense Refill   amiodarone (PACERONE) 200 MG tablet Take 200 mg by mouth daily.     apixaban (ELIQUIS) 5 MG TABS tablet Take 5 mg by mouth 2 (two) times daily.     carvedilol (COREG) 3.125 MG tablet Take 3.125 mg by mouth 2 (two) times daily.     cinacalcet (SENSIPAR) 90 MG tablet Take 90 mg by mouth daily.     FARXIGA 10 MG TABS tablet Take 10 mg by mouth daily.     GAVILYTE-G 236 g solution SMARTSIG:Milliliter(s) By Mouth     hydrOXYzine (ATARAX/VISTARIL) 50 MG tablet Take 50 mg by mouth 2 (two) times daily as needed for itching.      levothyroxine (SYNTHROID) 75 MCG tablet Take 75 mcg by mouth daily before breakfast.      lidocaine-prilocaine (EMLA) cream APPLY TO ACCESS SITE 30 MINUTES PRIOR TO DIALYSIS     midodrine (PROAMATINE) 10 MG tablet Take 10-20 mg by mouth daily. (for low BP associated with dialysis)     oxyCODONE-acetaminophen (PERCOCET/ROXICET) 5-325 MG tablet Take 1 tablet by mouth 2 (two) times daily as needed.     pantoprazole (PROTONIX) 40 MG tablet Take 40 mg by mouth daily.     polyethylene glycol-electrolytes (NULYTELY) 420 g solution Take by mouth.  sevelamer carbonate (RENVELA) 800 MG tablet Take 2,400 mg by mouth 3 (three) times daily with meals. And 1 tablet with snacks     aspirin EC 81 MG tablet Take 81 mg by mouth daily. Swallow whole. (Patient not taking: Reported on 05/19/2021)     isosorbide mononitrate (IMDUR) 60 MG 24 hr tablet Take 60 mg by mouth daily. (Patient not taking: Reported on 05/19/2021)     nitroGLYCERIN (NITROSTAT) 0.4 MG SL tablet Place 1 tablet (0.4 mg total) under the tongue every 5 (five) minutes  as needed for chest pain. (Patient not taking: Reported on 05/19/2021) 20 tablet 2   ranolazine (RANEXA) 1000 MG SR tablet Take 1 tablet (1,000 mg total) by mouth 2 (two) times daily. Increased from 500 mg 2 times daily. (Patient not taking: Reported on 05/19/2021) 60 tablet 2   rosuvastatin (CRESTOR) 20 MG tablet Take 20 mg by mouth at bedtime. (Patient not taking: Reported on 06/24/2021)     sacubitril-valsartan (ENTRESTO) 49-51 MG Take 1 tablet by mouth 2 (two) times daily. (Patient not taking: Reported on 05/19/2021)     sucralfate (CARAFATE) 1 g tablet Take 1 g by mouth 4 (four) times daily -  with meals and at bedtime. (Patient not taking: Reported on 05/19/2021)     VELTASSA 8.4 g packet Take 1 packet by mouth daily. (Patient not taking: Reported on 05/19/2021)     ZOFRAN 4 MG tablet Take 4 mg by mouth every 4 (four) hours as needed for nausea/vomiting. (Patient not taking: Reported on 05/19/2021)     Current Facility-Administered Medications on File Prior to Visit  Medication Dose Route Frequency Provider Last Rate Last Admin   sodium chloride flush (NS) 0.9 % injection 3 mL  3 mL Intravenous Q12H Neoma Laming A, MD   350 mL at 10/06/19 1212    There are no Patient Instructions on file for this visit. No follow-ups on file.   Kris Hartmann, NP

## 2021-06-25 DIAGNOSIS — Z992 Dependence on renal dialysis: Secondary | ICD-10-CM | POA: Diagnosis not present

## 2021-06-25 DIAGNOSIS — N186 End stage renal disease: Secondary | ICD-10-CM | POA: Diagnosis not present

## 2021-06-25 DIAGNOSIS — I509 Heart failure, unspecified: Secondary | ICD-10-CM | POA: Diagnosis not present

## 2021-06-26 DIAGNOSIS — I340 Nonrheumatic mitral (valve) insufficiency: Secondary | ICD-10-CM | POA: Diagnosis not present

## 2021-06-26 DIAGNOSIS — E782 Mixed hyperlipidemia: Secondary | ICD-10-CM | POA: Diagnosis not present

## 2021-06-26 DIAGNOSIS — I10 Essential (primary) hypertension: Secondary | ICD-10-CM | POA: Diagnosis not present

## 2021-06-26 DIAGNOSIS — I2510 Atherosclerotic heart disease of native coronary artery without angina pectoris: Secondary | ICD-10-CM | POA: Diagnosis not present

## 2021-06-26 DIAGNOSIS — I25810 Atherosclerosis of coronary artery bypass graft(s) without angina pectoris: Secondary | ICD-10-CM | POA: Diagnosis not present

## 2021-06-26 DIAGNOSIS — F17200 Nicotine dependence, unspecified, uncomplicated: Secondary | ICD-10-CM | POA: Diagnosis not present

## 2021-06-26 DIAGNOSIS — I509 Heart failure, unspecified: Secondary | ICD-10-CM | POA: Diagnosis not present

## 2021-06-26 DIAGNOSIS — I6389 Other cerebral infarction: Secondary | ICD-10-CM | POA: Diagnosis not present

## 2021-06-27 DIAGNOSIS — N186 End stage renal disease: Secondary | ICD-10-CM | POA: Diagnosis not present

## 2021-06-27 DIAGNOSIS — Z992 Dependence on renal dialysis: Secondary | ICD-10-CM | POA: Diagnosis not present

## 2021-06-30 DIAGNOSIS — N186 End stage renal disease: Secondary | ICD-10-CM | POA: Diagnosis not present

## 2021-06-30 DIAGNOSIS — E875 Hyperkalemia: Secondary | ICD-10-CM | POA: Diagnosis not present

## 2021-06-30 DIAGNOSIS — Z992 Dependence on renal dialysis: Secondary | ICD-10-CM | POA: Diagnosis not present

## 2021-07-02 DIAGNOSIS — N186 End stage renal disease: Secondary | ICD-10-CM | POA: Diagnosis not present

## 2021-07-02 DIAGNOSIS — Z992 Dependence on renal dialysis: Secondary | ICD-10-CM | POA: Diagnosis not present

## 2021-07-04 DIAGNOSIS — N186 End stage renal disease: Secondary | ICD-10-CM | POA: Diagnosis not present

## 2021-07-04 DIAGNOSIS — Z992 Dependence on renal dialysis: Secondary | ICD-10-CM | POA: Diagnosis not present

## 2021-07-07 DIAGNOSIS — N186 End stage renal disease: Secondary | ICD-10-CM | POA: Diagnosis not present

## 2021-07-07 DIAGNOSIS — Z992 Dependence on renal dialysis: Secondary | ICD-10-CM | POA: Diagnosis not present

## 2021-07-09 DIAGNOSIS — N186 End stage renal disease: Secondary | ICD-10-CM | POA: Diagnosis not present

## 2021-07-09 DIAGNOSIS — Z992 Dependence on renal dialysis: Secondary | ICD-10-CM | POA: Diagnosis not present

## 2021-07-11 DIAGNOSIS — N186 End stage renal disease: Secondary | ICD-10-CM | POA: Diagnosis not present

## 2021-07-11 DIAGNOSIS — Z992 Dependence on renal dialysis: Secondary | ICD-10-CM | POA: Diagnosis not present

## 2021-07-14 DIAGNOSIS — E875 Hyperkalemia: Secondary | ICD-10-CM | POA: Diagnosis not present

## 2021-07-14 DIAGNOSIS — Z992 Dependence on renal dialysis: Secondary | ICD-10-CM | POA: Diagnosis not present

## 2021-07-14 DIAGNOSIS — N186 End stage renal disease: Secondary | ICD-10-CM | POA: Diagnosis not present

## 2021-07-15 DIAGNOSIS — K7460 Unspecified cirrhosis of liver: Secondary | ICD-10-CM | POA: Diagnosis not present

## 2021-07-15 DIAGNOSIS — N186 End stage renal disease: Secondary | ICD-10-CM | POA: Diagnosis not present

## 2021-07-15 DIAGNOSIS — Z992 Dependence on renal dialysis: Secondary | ICD-10-CM | POA: Diagnosis not present

## 2021-07-15 DIAGNOSIS — R933 Abnormal findings on diagnostic imaging of other parts of digestive tract: Secondary | ICD-10-CM | POA: Diagnosis not present

## 2021-07-16 DIAGNOSIS — N186 End stage renal disease: Secondary | ICD-10-CM | POA: Diagnosis not present

## 2021-07-16 DIAGNOSIS — E8770 Fluid overload, unspecified: Secondary | ICD-10-CM | POA: Diagnosis not present

## 2021-07-16 DIAGNOSIS — Z992 Dependence on renal dialysis: Secondary | ICD-10-CM | POA: Diagnosis not present

## 2021-07-17 DIAGNOSIS — I340 Nonrheumatic mitral (valve) insufficiency: Secondary | ICD-10-CM | POA: Diagnosis not present

## 2021-07-17 DIAGNOSIS — I509 Heart failure, unspecified: Secondary | ICD-10-CM | POA: Diagnosis not present

## 2021-07-17 DIAGNOSIS — I10 Essential (primary) hypertension: Secondary | ICD-10-CM | POA: Diagnosis not present

## 2021-07-17 DIAGNOSIS — I2510 Atherosclerotic heart disease of native coronary artery without angina pectoris: Secondary | ICD-10-CM | POA: Diagnosis not present

## 2021-07-17 DIAGNOSIS — I25810 Atherosclerosis of coronary artery bypass graft(s) without angina pectoris: Secondary | ICD-10-CM | POA: Diagnosis not present

## 2021-07-17 DIAGNOSIS — E782 Mixed hyperlipidemia: Secondary | ICD-10-CM | POA: Diagnosis not present

## 2021-07-17 DIAGNOSIS — I6389 Other cerebral infarction: Secondary | ICD-10-CM | POA: Diagnosis not present

## 2021-07-17 DIAGNOSIS — I219 Acute myocardial infarction, unspecified: Secondary | ICD-10-CM | POA: Diagnosis not present

## 2021-07-17 DIAGNOSIS — F17200 Nicotine dependence, unspecified, uncomplicated: Secondary | ICD-10-CM | POA: Diagnosis not present

## 2021-07-18 DIAGNOSIS — E8770 Fluid overload, unspecified: Secondary | ICD-10-CM | POA: Diagnosis not present

## 2021-07-18 DIAGNOSIS — N186 End stage renal disease: Secondary | ICD-10-CM | POA: Diagnosis not present

## 2021-07-18 DIAGNOSIS — Z992 Dependence on renal dialysis: Secondary | ICD-10-CM | POA: Diagnosis not present

## 2021-07-21 DIAGNOSIS — E875 Hyperkalemia: Secondary | ICD-10-CM | POA: Diagnosis not present

## 2021-07-21 DIAGNOSIS — N186 End stage renal disease: Secondary | ICD-10-CM | POA: Diagnosis not present

## 2021-07-21 DIAGNOSIS — E8770 Fluid overload, unspecified: Secondary | ICD-10-CM | POA: Diagnosis not present

## 2021-07-21 DIAGNOSIS — Z992 Dependence on renal dialysis: Secondary | ICD-10-CM | POA: Diagnosis not present

## 2021-07-22 ENCOUNTER — Encounter: Payer: Self-pay | Admitting: Certified Registered Nurse Anesthetist

## 2021-07-22 ENCOUNTER — Encounter: Admission: RE | Payer: Self-pay | Source: Home / Self Care

## 2021-07-22 ENCOUNTER — Ambulatory Visit: Admission: RE | Admit: 2021-07-22 | Payer: Medicare Other | Source: Home / Self Care

## 2021-07-22 SURGERY — COLONOSCOPY
Anesthesia: General

## 2021-07-23 DIAGNOSIS — E8770 Fluid overload, unspecified: Secondary | ICD-10-CM | POA: Diagnosis not present

## 2021-07-23 DIAGNOSIS — N186 End stage renal disease: Secondary | ICD-10-CM | POA: Diagnosis not present

## 2021-07-23 DIAGNOSIS — Z992 Dependence on renal dialysis: Secondary | ICD-10-CM | POA: Diagnosis not present

## 2021-07-25 DIAGNOSIS — E8770 Fluid overload, unspecified: Secondary | ICD-10-CM | POA: Diagnosis not present

## 2021-07-25 DIAGNOSIS — Z992 Dependence on renal dialysis: Secondary | ICD-10-CM | POA: Diagnosis not present

## 2021-07-25 DIAGNOSIS — N186 End stage renal disease: Secondary | ICD-10-CM | POA: Diagnosis not present

## 2021-07-26 DIAGNOSIS — I509 Heart failure, unspecified: Secondary | ICD-10-CM | POA: Diagnosis not present

## 2021-07-26 DIAGNOSIS — Z992 Dependence on renal dialysis: Secondary | ICD-10-CM | POA: Diagnosis not present

## 2021-07-26 DIAGNOSIS — E8770 Fluid overload, unspecified: Secondary | ICD-10-CM | POA: Diagnosis not present

## 2021-07-26 DIAGNOSIS — N186 End stage renal disease: Secondary | ICD-10-CM | POA: Diagnosis not present

## 2021-07-28 DIAGNOSIS — N2581 Secondary hyperparathyroidism of renal origin: Secondary | ICD-10-CM | POA: Diagnosis not present

## 2021-07-28 DIAGNOSIS — E8770 Fluid overload, unspecified: Secondary | ICD-10-CM | POA: Diagnosis not present

## 2021-07-28 DIAGNOSIS — Z992 Dependence on renal dialysis: Secondary | ICD-10-CM | POA: Diagnosis not present

## 2021-07-28 DIAGNOSIS — N186 End stage renal disease: Secondary | ICD-10-CM | POA: Diagnosis not present

## 2021-07-28 DIAGNOSIS — E875 Hyperkalemia: Secondary | ICD-10-CM | POA: Diagnosis not present

## 2021-07-30 DIAGNOSIS — Z992 Dependence on renal dialysis: Secondary | ICD-10-CM | POA: Diagnosis not present

## 2021-07-30 DIAGNOSIS — N186 End stage renal disease: Secondary | ICD-10-CM | POA: Diagnosis not present

## 2021-07-30 DIAGNOSIS — E8770 Fluid overload, unspecified: Secondary | ICD-10-CM | POA: Diagnosis not present

## 2021-08-01 DIAGNOSIS — E8770 Fluid overload, unspecified: Secondary | ICD-10-CM | POA: Diagnosis not present

## 2021-08-01 DIAGNOSIS — Z992 Dependence on renal dialysis: Secondary | ICD-10-CM | POA: Diagnosis not present

## 2021-08-01 DIAGNOSIS — N186 End stage renal disease: Secondary | ICD-10-CM | POA: Diagnosis not present

## 2021-08-04 DIAGNOSIS — Z992 Dependence on renal dialysis: Secondary | ICD-10-CM | POA: Diagnosis not present

## 2021-08-04 DIAGNOSIS — N186 End stage renal disease: Secondary | ICD-10-CM | POA: Diagnosis not present

## 2021-08-04 DIAGNOSIS — E875 Hyperkalemia: Secondary | ICD-10-CM | POA: Diagnosis not present

## 2021-08-04 DIAGNOSIS — E8770 Fluid overload, unspecified: Secondary | ICD-10-CM | POA: Diagnosis not present

## 2021-08-05 DIAGNOSIS — I10 Essential (primary) hypertension: Secondary | ICD-10-CM | POA: Diagnosis not present

## 2021-08-05 DIAGNOSIS — N189 Chronic kidney disease, unspecified: Secondary | ICD-10-CM | POA: Diagnosis not present

## 2021-08-05 DIAGNOSIS — N184 Chronic kidney disease, stage 4 (severe): Secondary | ICD-10-CM | POA: Diagnosis not present

## 2021-08-05 DIAGNOSIS — E8779 Other fluid overload: Secondary | ICD-10-CM | POA: Diagnosis not present

## 2021-08-05 DIAGNOSIS — E782 Mixed hyperlipidemia: Secondary | ICD-10-CM | POA: Diagnosis not present

## 2021-08-06 DIAGNOSIS — Z992 Dependence on renal dialysis: Secondary | ICD-10-CM | POA: Diagnosis not present

## 2021-08-06 DIAGNOSIS — N186 End stage renal disease: Secondary | ICD-10-CM | POA: Diagnosis not present

## 2021-08-06 DIAGNOSIS — E8770 Fluid overload, unspecified: Secondary | ICD-10-CM | POA: Diagnosis not present

## 2021-08-07 ENCOUNTER — Emergency Department: Payer: Medicare Other

## 2021-08-07 ENCOUNTER — Encounter: Payer: Self-pay | Admitting: Emergency Medicine

## 2021-08-07 ENCOUNTER — Observation Stay
Admission: EM | Admit: 2021-08-07 | Discharge: 2021-08-09 | Disposition: A | Discharge status: Home / Self Care | Payer: Medicare Other | Attending: Family Medicine | Admitting: Family Medicine

## 2021-08-07 ENCOUNTER — Observation Stay: Payer: Medicare Other

## 2021-08-07 ENCOUNTER — Other Ambulatory Visit: Payer: Self-pay

## 2021-08-07 DIAGNOSIS — D849 Immunodeficiency, unspecified: Secondary | ICD-10-CM | POA: Insufficient documentation

## 2021-08-07 DIAGNOSIS — Z87891 Personal history of nicotine dependence: Secondary | ICD-10-CM | POA: Diagnosis not present

## 2021-08-07 DIAGNOSIS — Z79899 Other long term (current) drug therapy: Secondary | ICD-10-CM | POA: Diagnosis not present

## 2021-08-07 DIAGNOSIS — R069 Unspecified abnormalities of breathing: Secondary | ICD-10-CM | POA: Diagnosis not present

## 2021-08-07 DIAGNOSIS — R1011 Right upper quadrant pain: Secondary | ICD-10-CM

## 2021-08-07 DIAGNOSIS — I480 Paroxysmal atrial fibrillation: Secondary | ICD-10-CM | POA: Diagnosis not present

## 2021-08-07 DIAGNOSIS — Z7982 Long term (current) use of aspirin: Secondary | ICD-10-CM | POA: Diagnosis not present

## 2021-08-07 DIAGNOSIS — E039 Hypothyroidism, unspecified: Secondary | ICD-10-CM | POA: Diagnosis not present

## 2021-08-07 DIAGNOSIS — R0789 Other chest pain: Secondary | ICD-10-CM | POA: Diagnosis not present

## 2021-08-07 DIAGNOSIS — Z951 Presence of aortocoronary bypass graft: Secondary | ICD-10-CM

## 2021-08-07 DIAGNOSIS — D6959 Other secondary thrombocytopenia: Secondary | ICD-10-CM | POA: Insufficient documentation

## 2021-08-07 DIAGNOSIS — N186 End stage renal disease: Secondary | ICD-10-CM

## 2021-08-07 DIAGNOSIS — I499 Cardiac arrhythmia, unspecified: Secondary | ICD-10-CM | POA: Diagnosis not present

## 2021-08-07 DIAGNOSIS — Z743 Need for continuous supervision: Secondary | ICD-10-CM | POA: Diagnosis not present

## 2021-08-07 DIAGNOSIS — Z91040 Latex allergy status: Secondary | ICD-10-CM | POA: Diagnosis not present

## 2021-08-07 DIAGNOSIS — I509 Heart failure, unspecified: Secondary | ICD-10-CM | POA: Insufficient documentation

## 2021-08-07 DIAGNOSIS — I132 Hypertensive heart and chronic kidney disease with heart failure and with stage 5 chronic kidney disease, or end stage renal disease: Secondary | ICD-10-CM | POA: Insufficient documentation

## 2021-08-07 DIAGNOSIS — K8020 Calculus of gallbladder without cholecystitis without obstruction: Secondary | ICD-10-CM | POA: Diagnosis not present

## 2021-08-07 DIAGNOSIS — D638 Anemia in other chronic diseases classified elsewhere: Secondary | ICD-10-CM | POA: Diagnosis present

## 2021-08-07 DIAGNOSIS — Z7901 Long term (current) use of anticoagulants: Secondary | ICD-10-CM | POA: Diagnosis not present

## 2021-08-07 DIAGNOSIS — Z20822 Contact with and (suspected) exposure to covid-19: Secondary | ICD-10-CM | POA: Diagnosis not present

## 2021-08-07 DIAGNOSIS — I2510 Atherosclerotic heart disease of native coronary artery without angina pectoris: Secondary | ICD-10-CM | POA: Diagnosis not present

## 2021-08-07 DIAGNOSIS — Z992 Dependence on renal dialysis: Secondary | ICD-10-CM | POA: Diagnosis not present

## 2021-08-07 DIAGNOSIS — I517 Cardiomegaly: Secondary | ICD-10-CM | POA: Diagnosis not present

## 2021-08-07 DIAGNOSIS — D696 Thrombocytopenia, unspecified: Secondary | ICD-10-CM

## 2021-08-07 DIAGNOSIS — R079 Chest pain, unspecified: Secondary | ICD-10-CM | POA: Diagnosis not present

## 2021-08-07 DIAGNOSIS — J811 Chronic pulmonary edema: Secondary | ICD-10-CM | POA: Diagnosis not present

## 2021-08-07 DIAGNOSIS — Z8619 Personal history of other infectious and parasitic diseases: Secondary | ICD-10-CM | POA: Diagnosis present

## 2021-08-07 LAB — COMPREHENSIVE METABOLIC PANEL
ALT: 19 U/L (ref 0–44)
AST: 28 U/L (ref 15–41)
Albumin: 3.9 g/dL (ref 3.5–5.0)
Alkaline Phosphatase: 64 U/L (ref 38–126)
Anion gap: 15 (ref 5–15)
BUN: 29 mg/dL — ABNORMAL HIGH (ref 6–20)
CO2: 28 mmol/L (ref 22–32)
Calcium: 8.2 mg/dL — ABNORMAL LOW (ref 8.9–10.3)
Chloride: 95 mmol/L — ABNORMAL LOW (ref 98–111)
Creatinine, Ser: 6 mg/dL — ABNORMAL HIGH (ref 0.44–1.00)
GFR, Estimated: 8 mL/min — ABNORMAL LOW (ref >60)
Glucose, Bld: 101 mg/dL — ABNORMAL HIGH (ref 70–99)
Potassium: 4.5 mmol/L (ref 3.5–5.1)
Sodium: 138 mmol/L (ref 135–145)
Total Bilirubin: 1.2 mg/dL (ref 0.3–1.2)
Total Protein: 7.2 g/dL (ref 6.5–8.1)

## 2021-08-07 LAB — CBC WITH DIFFERENTIAL/PLATELET
Abs Immature Granulocytes: 0.02 10*3/uL (ref 0.00–0.07)
Basophils Absolute: 0 10*3/uL (ref 0.0–0.1)
Basophils Relative: 1 %
Eosinophils Absolute: 0.1 10*3/uL (ref 0.0–0.5)
Eosinophils Relative: 3 %
HCT: 37.8 % (ref 36.0–46.0)
Hemoglobin: 12.1 g/dL (ref 12.0–15.0)
Immature Granulocytes: 1 %
Lymphocytes Relative: 13 %
Lymphs Abs: 0.6 10*3/uL — ABNORMAL LOW (ref 0.7–4.0)
MCH: 33.3 pg (ref 26.0–34.0)
MCHC: 32 g/dL (ref 30.0–36.0)
MCV: 104.1 fL — ABNORMAL HIGH (ref 80.0–100.0)
Monocytes Absolute: 0.6 10*3/uL (ref 0.1–1.0)
Monocytes Relative: 14 %
Neutro Abs: 3.1 10*3/uL (ref 1.7–7.7)
Neutrophils Relative %: 68 %
Platelets: 124 10*3/uL — ABNORMAL LOW (ref 150–400)
RBC: 3.63 MIL/uL — ABNORMAL LOW (ref 3.87–5.11)
RDW: 14.7 % (ref 11.5–15.5)
WBC: 4.4 10*3/uL (ref 4.0–10.5)
nRBC: 0 % (ref 0.0–0.2)

## 2021-08-07 LAB — PROTIME-INR
INR: 1.6 — ABNORMAL HIGH (ref 0.8–1.2)
Prothrombin Time: 19.2 seconds — ABNORMAL HIGH (ref 11.4–15.2)

## 2021-08-07 LAB — TROPONIN I (HIGH SENSITIVITY)
Troponin I (High Sensitivity): 70 ng/L — ABNORMAL HIGH (ref <18)
Troponin I (High Sensitivity): 65 ng/L — ABNORMAL HIGH (ref <18)
Troponin I (High Sensitivity): 62 ng/L — ABNORMAL HIGH (ref <18)

## 2021-08-07 LAB — RESP PANEL BY RT-PCR (FLU A&B, COVID) ARPGX2
Influenza A by PCR: NEGATIVE
Influenza B by PCR: NEGATIVE
SARS Coronavirus 2 by RT PCR: NEGATIVE

## 2021-08-07 LAB — HEPARIN LEVEL (UNFRACTIONATED): Heparin Unfractionated: >1.1 IU/mL — ABNORMAL HIGH (ref 0.30–0.70)

## 2021-08-07 LAB — HIV ANTIBODY (ROUTINE TESTING W REFLEX): HIV Screen 4th Generation wRfx: NONREACTIVE

## 2021-08-07 LAB — PROCALCITONIN: Procalcitonin: 0.24 ng/mL

## 2021-08-07 LAB — BRAIN NATRIURETIC PEPTIDE: B Natriuretic Peptide: 2696.8 pg/mL — ABNORMAL HIGH (ref 0.0–100.0)

## 2021-08-07 LAB — APTT: aPTT: 33 seconds (ref 24–36)

## 2021-08-07 MED ORDER — HEPARIN (PORCINE) 25000 UT/250ML-% IV SOLN
800.0000 [IU]/h | INTRAVENOUS | Status: DC
  Administered 2021-08-07 – 2021-08-08 (×2): 950 [IU]/h via INTRAVENOUS
  Filled 2021-08-07 (×2): qty 250

## 2021-08-07 MED ORDER — CARVEDILOL 3.125 MG PO TABS
3.1250 mg | ORAL_TABLET | Freq: Two times a day (BID) | ORAL | Status: DC
  Administered 2021-08-08 – 2021-08-09 (×2): 3.125 mg via ORAL
  Filled 2021-08-07 (×3): qty 1

## 2021-08-07 MED ORDER — ASPIRIN EC 81 MG PO TBEC
81.0000 mg | DELAYED_RELEASE_TABLET | Freq: Every day | ORAL | Status: DC
  Administered 2021-08-09: 81 mg via ORAL
  Filled 2021-08-07 (×2): qty 1

## 2021-08-07 MED ORDER — AMIODARONE HCL 200 MG PO TABS
200.0000 mg | ORAL_TABLET | Freq: Every day | ORAL | Status: DC
  Administered 2021-08-09: 200 mg via ORAL
  Filled 2021-08-07 (×2): qty 1

## 2021-08-07 MED ORDER — LEVOTHYROXINE SODIUM 50 MCG PO TABS
75.0000 ug | ORAL_TABLET | Freq: Every day | ORAL | Status: DC
  Administered 2021-08-08 – 2021-08-09 (×2): 75 ug via ORAL
  Filled 2021-08-07 (×2): qty 1

## 2021-08-07 MED ORDER — ASPIRIN 81 MG PO CHEW
324.0000 mg | CHEWABLE_TABLET | Freq: Once | ORAL | Status: AC
Start: 2021-08-07 — End: 2021-08-07
  Administered 2021-08-07: 324 mg via ORAL
  Filled 2021-08-07: qty 4

## 2021-08-07 MED ORDER — ACETAMINOPHEN 325 MG PO TABS
650.0000 mg | ORAL_TABLET | Freq: Four times a day (QID) | ORAL | Status: DC | PRN
  Administered 2021-08-07 – 2021-08-09 (×4): 650 mg via ORAL
  Filled 2021-08-07 (×4): qty 2

## 2021-08-07 MED ORDER — ROSUVASTATIN CALCIUM 10 MG PO TABS
20.0000 mg | ORAL_TABLET | Freq: Every day | ORAL | Status: DC
  Administered 2021-08-07 – 2021-08-08 (×2): 20 mg via ORAL
  Filled 2021-08-07: qty 2
  Filled 2021-08-07: qty 1
  Filled 2021-08-07: qty 2

## 2021-08-07 MED ORDER — HYDROMORPHONE HCL 1 MG/ML IJ SOLN
0.5000 mg | INTRAMUSCULAR | Status: AC
Start: 2021-08-07 — End: 2021-08-07
  Administered 2021-08-07: 0.5 mg via INTRAVENOUS
  Filled 2021-08-07: qty 1

## 2021-08-07 MED ORDER — ACETAMINOPHEN 650 MG RE SUPP: 650.0000 mg | Freq: Four times a day (QID) | RECTAL | Status: DC | PRN

## 2021-08-07 NOTE — ED Provider Notes (Signed)
[facility] Provider Note    Event Date/Time   First MD Initiated Contact with Patient 08/07/21 1502     (approximate)   History   Chest Pain   HPI  [name] is a 61 y.o. female end-stage renal disease hepatitis coronary artery disease paroxysmal A-fib prior stroke   Reviewed old records including patient's office visit with Dr. Lucky Cowboy on 05/19/21 where she was evaluated for AV access dysfunction  Dialysis patient.  Still slight chest pain during one of her dialysis sessions in the last week.  Then today had an yesterday having chest pressure in the middle of her chest.  Ports is a similar feeling to when she had coronary bypass, typically follows with Dr. Yancey Flemings her cardiologist  (Dr. Humphrey Rolls evidently out of country today)  Physical Exam   Triage Vital Signs: ED Triage Vitals  Enc Vitals Group     BP 08/07/21 1414 (!) 131/115     Pulse Rate 08/07/21 1414 85     Resp 08/07/21 1414 18     Temp 08/07/21 1414 98.9 F (37.2 C)     Temp Source 08/07/21 1414 Oral     SpO2 08/07/21 1409 100 %     Weight 08/07/21 1412 165 lb (74.8 kg)     Height 08/07/21 1412 5\' 6"  (1.676 m)     Head Circumference --      Peak Flow --      Pain Score 08/07/21 1411 9     Pain Loc --      Pain Edu? --      Excl. in [place]? --     Most recent vital signs: Vitals:   08/07/21 1414 08/07/21 1512  BP: (!) 131/115 105/86  Pulse: 85 86  Resp: 18 17  Temp: 98.9 F (37.2 C)   SpO2: 97% 96%     General: Awake, no distress.  Some mild substernal chest pressure pain at this time CV:  Good peripheral perfusion.  Normal heart tones, slightly distant Resp:  Normal effort.  Abd:  No distention.  Patient has mild edema, reports she does feel a sense of fullness and that her volume is up.  Has experienced in the past Other:  Strong peripheral pulses lower extremities bilateral   ED Results / Procedures / Treatments   Labs (all labs ordered are listed, but only abnormal  results are displayed) Labs Reviewed  CBC WITH DIFFERENTIAL/PLATELET - Abnormal; Notable for the following components:      Result Value   RBC 3.63 (*)    MCV 104.1 (*)    Platelets 124 (*)    Lymphs Abs 0.6 (*)    All other components within normal limits  COMPREHENSIVE METABOLIC PANEL - Abnormal; Notable for the following components:   Chloride 95 (*)    Glucose, Bld 101 (*)    BUN 29 (*)    Creatinine, Ser 6.00 (*)    Calcium 8.2 (*)    GFR, Estimated 8 (*)    All other components within normal limits  TROPONIN I (HIGH SENSITIVITY) - Abnormal; Notable for the following components:   Troponin I (High Sensitivity) 70 (*)    All other components within normal limits  RESP PANEL BY RT-PCR (FLU A&B, COVID) ARPGX2  BRAIN NATRIURETIC PEPTIDE     EKG   Reviewed inter by me at 1415 Heart rate 85 QRS 180 QTc 540 Right bundle branch block, nonspecific ST segment abnormalities, probable old inferior MI  Of  note, I did review 2 of the EMS prehospital EKGs 1 of which demonstrates relatively flat segments associated with RSR prime, but the second 1 demonstrates a slight amount of depression inversion associated.  This could be indicative of possible dynamic changes   RADIOLOGY  Personally viewed the patient's chest x-ray, demonstrates pulmonary vascular congestion     PROCEDURES:  Critical Care performed: No  Procedures   MEDICATIONS ORDERED IN ED: Medications  HYDROmorphone (DILAUDID) injection 0.5 mg (has no administration in time range)  aspirin chewable tablet 324 mg (has no administration in time range)     IMPRESSION / MDM / ASSESSMENT AND PLAN / ED COURSE  I reviewed the triage vital signs and the nursing notes.                              Differential diagnosis includes, but is not limited to, ACS, aortic dissection, pulmonary embolism, cardiac tamponade, pneumothorax, pneumonia, pericarditis, myocarditis, GI-related causes including esophagitis/gastritis,  and musculoskeletal chest wall pain.    No associate abdominal symptoms.  Appears volume up, reports increased weight gain about 10 pounds.  Please consult to nephrology.  Patient with mild oxygen requirement 2 L, currently awake alert.  Blood pressure slight Lee hypotensive, will withhold nitrates at this time.  Will provide hydromorphone which patient reports she is tolerated well in the past and also aspirin.  Will admit patient for further care and management.  Records reviewed she has notable known coronary artery disease on her last heart cath and given her symptomatology I have high concern for this  Low risk for pulmonary embolism she is and a coagulated for her A-fib, reports compliance  Patient's labs reviewed personally interpreted by me, notable for creatinine is elevated but consistent with end-stage renal disease also elevated troponin of 70 which is close to her previous baseline troponins is in end-stage renal patient.  She is deemed to be of moderate risk for ACS today, and given this and known coronary disease in the setting of her chest pain we will admit for further care observation         FINAL CLINICAL IMPRESSION(S) / ED DIAGNOSES   Final diagnoses:  Chest pain, moderate coronary artery risk     Rx / DC Orders   ED Discharge Orders     None        Note:  This document was prepared using Dragon voice recognition software and may include unintentional dictation errors.   Delman Kitten, MD 08/08/21 [phone]

## 2021-08-07 NOTE — ED Triage Notes (Signed)
Patient to ED via ACMES from home for centralized CP and SOB x2 days. Patient is a dialysis pt with last receiving treatment yesterday. Pt wears 2L [place] at baseline. Aox4 at this time.

## 2021-08-07 NOTE — Progress Notes (Signed)
[facility] for heparin initiation and monitoring Indication: chest pain/ACS  Allergies  Allergen Reactions   Morphine And Related Hives   Levaquin [Levofloxacin] Itching    Severe itching; prickly sensation   Other    Latex Rash   Tape Rash    Patient Measurements: Height: 5\' 6"  (167.6 cm) Weight: 74.8 kg (165 lb) IBW/kg (Calculated) : 59.3 Heparin Dosing Weight: 74.3 kg  Vital Signs: Temp: 98.9 F (37.2 C) (02/23 1414) Temp Source: Oral (02/23 1414) BP: 105/86 (02/23 1512) Pulse Rate: 86 (02/23 1512)  Labs: Recent Labs    08/07/21 1416  HGB 12.1  HCT 37.8  PLT 124*  CREATININE 6.00*  TROPONINIHS 70*    Estimated Creatinine Clearance: 10.6 mL/min (A) (by C-G formula based on SCr of 6 mg/dL (H)).   Medical History: Past Medical History:  Diagnosis Date   Adult behavior problems    frontal lobe CVA   Anemia    chronic disease   Cardiomyopathy ([place])    CHF (congestive heart failure) (HCC)    Chicken pox    Coronary artery disease    GERD (gastroesophageal reflux disease)    Heart failure (HCC)    Hepatitis    history of hep c   History of bipolar disorder    Hyperlipidemia    Hypertension    Myocardial infarction [facility])    Obesity    Paroxysmal atrial fibrillation ([place])    Peripheral vascular disease ([place])    Renal failure    Renal insufficiency    Stroke [facility]) 2011   Superficial incisional surgical site infection 03/14/2014    Assessment: 61 y.o. female with history of CAD s/p CABG  in February 2020, atrial fibrillation on apixaban (last dose 02/23 1000), ESRD on hemodialysis on MWF has had dialysis yesterday has been experiencing chest pain for the last 48 hours. She was admitted and treated with IV heparin in 2020 with her weight approximately the same as current measurement.   Goal of Therapy:  Heparin level 0.3-0.7 units/ml aPTT 66 - 102 seconds Monitor platelets by anticoagulation protocol: Yes   Plan:   Avoid initial bolus given recent apixaban administration Start heparin infusion at 950 units/hr (previously therapeutic at this rate) Check anti-Xa and aPTT level in 8 hours and daily while on heparin We will use aPTT levels to guide infusion rates until aPTT and heparin levels correlate Continue to monitor H&H and platelets  Dallie Piles 08/07/2021,4:33 PM

## 2021-08-07 NOTE — H&P (Signed)
History and Physical    [name] WUJ:[id number] DOB: 30-Jul-1960 DOA: 08/07/2021  PCP: [name], MD  Patient coming from: Home.  Chief Complaint: Chest pain.  HPI: [name] is a 61 y.o. female with history of CAD status post CABG with cardiac cath done in February 2020 showing patent stent and subsequent stress test in 2020 was unremarkable, ESRD on hemodialysis on Monday Wednesday and Friday has had dialysis yesterday has been experiencing chest pain for the last 48 hours which is substernal nonradiating.  Patient also has been having some cough with productive sputum and congested for the same duration.  Patient states she has been in poor appetite and had at least 4-5 episodes of nausea vomiting which was nonbloody.  Complains of pain also in the right upper quadrant.  Patient states she was already scheduled for a colonoscopy about 3 weeks ago which did not happen.  But she did take the prep.  ED Course: In the ER patient had an EKG which shows sinus rhythm with right bundle branch block high sensitive troponin was 72nd was pending BNP was 2600 COVID test is pending.  Patient admitted for further work-up for chest pain with history of CAD.  On exam patient also has right upper quadrant tenderness.  Review of Systems: As per HPI, rest all negative.   Past Medical History:  Diagnosis Date   Adult behavior problems    frontal lobe CVA   Anemia    chronic disease   Cardiomyopathy ([place])    CHF (congestive heart failure) (HCC)    Chicken pox    Coronary artery disease    GERD (gastroesophageal reflux disease)    Heart failure (HCC)    Hepatitis    history of hep c   History of bipolar disorder    Hyperlipidemia    Hypertension    Myocardial infarction [facility])    Obesity    Paroxysmal atrial fibrillation ([place])    Peripheral vascular disease ([place])    Renal failure    Renal insufficiency    Stroke [facility]) 2011   Superficial incisional surgical site infection  03/14/2014    Past Surgical History:  Procedure Laterality Date   A/V FISTULAGRAM Left 03/30/2019   Procedure: A/V FISTULAGRAM;  Surgeon: Algernon Huxley, MD;  Location: [place] CV LAB;  Service: Cardiovascular;  Laterality: Left;   A/V FISTULAGRAM Left 10/06/2019   Procedure: A/V FISTULAGRAM;  Surgeon: Algernon Huxley, MD;  Location: [place] [facility] LAB;  Service: Cardiovascular;  Laterality: Left;   A/V FISTULAGRAM Left 05/19/2021   Procedure: A/V FISTULAGRAM;  Surgeon: Algernon Huxley, MD;  Location: [place] [facility] LAB;  Service: Cardiovascular;  Laterality: Left;   COLONOSCOPY     COLONOSCOPY WITH PROPOFOL N/A 04/22/2021   Procedure: COLONOSCOPY WITH PROPOFOL;  Surgeon: Lesly Rubenstein, MD;  Location: [facility] ENDOSCOPY;  Service: Endoscopy;  Laterality: N/A;   CORONARY ANGIOPLASTY WITH STENT PLACEMENT  2013   CORONARY ARTERY BYPASS GRAFT     DIALYSIS FISTULA CREATION     ESOPHAGOGASTRODUODENOSCOPY     ESOPHAGOGASTRODUODENOSCOPY N/A 04/22/2021   Procedure: ESOPHAGOGASTRODUODENOSCOPY (EGD);  Surgeon: Lesly Rubenstein, MD;  Location: [facility] ENDOSCOPY;  Service: Endoscopy;  Laterality: N/A;   FLEXIBLE SIGMOIDOSCOPY N/A 02/23/2019   Procedure: FLEXIBLE SIGMOIDOSCOPY;  Surgeon: Lollie Sails, MD;  Location: [facility] ENDOSCOPY;  Service: Endoscopy;  Laterality: N/A;   LEFT HEART CATH AND CORS/GRAFTS ANGIOGRAPHY N/A 08/08/2018   Procedure: LEFT HEART CATH AND CORS/GRAFTS ANGIOGRAPHY;  Surgeon: Dionisio David, MD;  Location: [place] CV LAB;  Service: Cardiovascular;  Laterality: N/A;   LEFT HEART CATH AND CORS/GRAFTS ANGIOGRAPHY N/A 01/30/2019   Procedure: LEFT HEART CATH AND CORS/GRAFTS ANGIOGRAPHY;  Surgeon: Dionisio David, MD;  Location: [place] [facility] LAB;  Service: Cardiovascular;  Laterality: N/A;   percutaneous insertion intra aortic balloon right cath     ultrasound guided pericardiocentesis       reports that she has quit smoking. Her smoking use included cigarettes. She  has a 5.00 pack-year smoking history. She has never used smokeless tobacco. She reports that she does not drink alcohol and does not use drugs.  Allergies  Allergen Reactions   Morphine And Related Hives   Levaquin [Levofloxacin] Itching    Severe itching; prickly sensation   Other    Latex Rash   Tape Rash    Family History  Problem Relation Age of Onset   Hypertension Mother    Ovarian cancer Mother    Diabetes type II Mother    Hypertension Father    Kidney disease Father    Hypertension Sister    Diabetes type II Maternal Grandmother    Breast cancer Maternal Grandmother    Breast cancer Maternal Aunt    Hypertension Other    Cancer Other    Renal Disease Other     Prior to Admission medications   Medication Sig Start Date End Date Taking? Authorizing Provider  amiodarone (PACERONE) 200 MG tablet Take 200 mg by mouth daily. 07/30/20   [provider]  apixaban (ELIQUIS) 5 MG TABS tablet Take 5 mg by mouth 2 (two) times daily. 09/11/19   [provider]  aspirin EC 81 MG tablet Take 81 mg by mouth daily. Swallow whole. Patient not taking: Reported on 05/19/2021    [provider]  carvedilol (COREG) 3.125 MG tablet Take 3.125 mg by mouth 2 (two) times daily. 10/14/20   [provider]  cinacalcet (SENSIPAR) 90 MG tablet Take 90 mg by mouth daily.    [provider]  FARXIGA 10 MG TABS tablet Take 10 mg by mouth daily. 05/20/21   [provider]  GAVILYTE-G 236 g solution SMARTSIG:Milliliter(s) By Mouth 05/13/21   [provider]  hydrOXYzine (ATARAX/VISTARIL) 50 MG tablet Take 50 mg by mouth 2 (two) times daily as needed for itching.     [provider]  isosorbide mononitrate (IMDUR) 60 MG 24 hr tablet Take 60 mg by mouth daily. Patient not taking: Reported on 05/19/2021    [provider]  levothyroxine (SYNTHROID) 75 MCG tablet Take 75 mcg by mouth daily before breakfast.  11/02/18   [provider]  lidocaine-prilocaine (EMLA) cream APPLY TO ACCESS SITE 30 MINUTES PRIOR TO DIALYSIS 04/08/19   [provider]  midodrine (PROAMATINE) 10 MG tablet Take 10-20 mg by mouth daily. (for low BP associated with dialysis)    [provider]  nitroGLYCERIN (NITROSTAT) 0.4 MG SL tablet Place 1 tablet (0.4 mg total) under the tongue every 5 (five) minutes as needed for chest pain. Patient not taking: Reported on 05/19/2021 03/13/21   Enzo Bi, MD  oxyCODONE-acetaminophen (PERCOCET/ROXICET) 5-325 MG tablet Take 1 tablet by mouth 2 (two) times daily as needed. 02/08/21   [provider]  pantoprazole (PROTONIX) 40 MG tablet Take 40 mg by mouth daily.    [provider]  polyethylene glycol-electrolytes (NULYTELY) 420 g solution Take by mouth. 04/15/21   [provider]  ranolazine (RANEXA) 1000 MG SR tablet Take 1 tablet (1,000 mg total) by mouth 2 (two) times daily. Increased from 500 mg 2 times daily. Patient not taking: Reported on 05/19/2021 03/13/21 06/11/21  Enzo Bi, MD  rosuvastatin (CRESTOR) 20 MG tablet Take 20 mg by mouth at bedtime. Patient not taking: Reported on 06/24/2021 09/02/20   [provider]  sacubitril-valsartan (ENTRESTO) 49-51 MG Take 1 tablet by mouth 2 (two) times daily. Patient not taking: Reported on 05/19/2021    [provider]  sevelamer carbonate (RENVELA) 800 MG tablet Take 2,400 mg by mouth 3 (three) times daily with meals. And 1 tablet with snacks    [provider]  sucralfate (CARAFATE) 1 g tablet Take 1 g by mouth 4 (four) times daily -  with meals and at bedtime. Patient not taking: Reported on 05/19/2021    [provider]  VELTASSA 8.4 g packet Take 1 packet by mouth daily. Patient not taking: Reported on 05/19/2021 01/27/21   [provider]  ZOFRAN 4 MG tablet Take 4 mg by mouth every 4 (four) hours as needed for nausea/vomiting. Patient not taking: Reported on  05/19/2021 05/20/20   [provider]    Physical Exam: Constitutional: Moderately built and nourished. Vitals:   08/07/21 1409 08/07/21 1412 08/07/21 1414 08/07/21 1512  BP:   (!) 131/115 105/86  Pulse:   85 86  Resp:   18 17  Temp:   98.9 F (37.2 C)   TempSrc:   Oral   SpO2: 100%  97% 96%  Weight:  74.8 kg    Height:  5\' 6"  (1.676 m)     Eyes: Anicteric no pallor. ENMT: No discharge from the ears eyes nose and mouth. Neck: No mass felt.  No neck rigidity. Respiratory: No rhonchi or crepitations. Cardiovascular: S1-S2 heard. Abdomen: Right upper quadrant tenderness no guarding or rigidity. Musculoskeletal: No edema. Skin: No rash. Neurologic: Alert awake oriented to time place and person.  Moves all extremities. Psychiatric: Appears normal.  Normal affect.   Labs on Admission: I have personally reviewed following labs and imaging studies  CBC: Recent Labs  Lab 08/07/21 1416  WBC 4.4  NEUTROABS 3.1  HGB 12.1  HCT 37.8  MCV 104.1*  PLT 884*   Basic Metabolic Panel: Recent Labs  Lab 08/07/21 1416  NA 138  K 4.5  CL 95*  CO2 28  GLUCOSE 101*  BUN 29*  CREATININE 6.00*  CALCIUM 8.2*   GFR: Estimated Creatinine Clearance: 10.6 mL/min (A) (by C-G formula based on SCr of 6 mg/dL (H)). Liver Function Tests: Recent Labs  Lab 08/07/21 1416  AST 28  ALT 19  ALKPHOS 64  BILITOT 1.2  PROT 7.2  ALBUMIN 3.9   No results for input(s): LIPASE, AMYLASE in the last 168 hours. No results for input(s): AMMONIA in the last 168 hours. Coagulation Profile: No results for input(s): INR, PROTIME in the last 168 hours. Cardiac Enzymes: No results for input(s): CKTOTAL, CKMB, CKMBINDEX, TROPONINI in the last 168 hours. BNP (last 3 results) No results for input(s): PROBNP in the last 8760 hours. HbA1C: No results for input(s): HGBA1C in the last 72 hours. CBG: No results for input(s): GLUCAP in the last 168 hours. Lipid Profile: No results for input(s):  CHOL, HDL, LDLCALC, TRIG, CHOLHDL, LDLDIRECT in the last 72 hours. Thyroid Function Tests: No results for input(s): TSH, T4TOTAL, FREET4, T3FREE, THYROIDAB in the last 72 hours. Anemia Panel: No results for input(s): VITAMINB12, FOLATE, FERRITIN,  TIBC, IRON, RETICCTPCT in the last 72 hours. Urine analysis:    Component Value Date/Time   COLORURINE Yellow 09/14/2012 0414   APPEARANCEUR Cloudy 09/14/2012 0414   LABSPEC 1.018 09/14/2012 0414   PHURINE 9.0 09/14/2012 0414   GLUCOSEU 50 mg/dL 09/14/2012 0414   HGBUR 1+ 09/14/2012 0414   BILIRUBINUR Negative 09/14/2012 0414   KETONESUR Negative 09/14/2012 0414   PROTEINUR >=500 09/14/2012 0414   NITRITE Negative 09/14/2012 0414   LEUKOCYTESUR 2+ 09/14/2012 0414   Sepsis Labs: @LABRCNTIP (procalcitonin:4,lacticidven:4) )No results found for this or any previous visit (from the past 240 hour(s)).   Radiological Exams on Admission: DG Chest 2 View  Result Date: 08/07/2021 CLINICAL DATA:  Chest pain EXAM: CHEST - 2 VIEW COMPARISON:  Radiograph 03/11/2021 FINDINGS: Cardiomegaly. Prior median sternotomy CABG. There is no focal airspace consolidation. Mild central pulmonary vascular congestion. There is no large pleural effusion. There is no visible pneumothorax. No acute osseous abnormality. IMPRESSION: Cardiomegaly with central pulmonary vascular congestion. Electronically Signed   By: Maurine Simmering M.D.   On: 08/07/2021 15:16    EKG: Independently reviewed.  Normal sinus rhythm RBBB.  Assessment/Plan Principal Problem:   Chest pain    Chest pain with history of CAD status post CABG -we will trend cardiac markers to rule out ACS.  In anticipation of possible cardiac procedure will hold apixaban and keep patient on heparin.  Aspirin.  Patient blood pressures in the low normal.  Closely monitor.  Patient's home medication list verifiable patient states she does take Coreg and amiodarone which will be continued. Right upper quadrant tenderness  with history of cirrhosis of liver for which I have ordered ultrasound of the abdomen to make sure there is no cholecystitis.  LFTs are normal.  Closely monitor. History of paroxysmal atrial fibrillation on amiodarone and Eliquis.  Patient states she also takes Coreg.  In anticipation of possible cardiac procedure we will keep patient on heparin and hold Eliquis. ESRD on hemodialysis had dialysis yesterday appears to be slightly volume overloaded.  Chest x-ray does show congestion.  Nephrology will be consulted. Chronic thrombocytopenia could be from liver disease follow CBC. Hypothyroidism on Synthroid. Upper respiratory tract symptoms cough and productive sputum.  Patient is afebrile.  COVID test is still pending.  We will closely monitor.  Check procalcitonin.  We will consult cardiology.  Patient is cardiologist Dr. Neoma Laming is out of town as per the office.  [facility] clinic cardiologist covering for them.   COVID test is pending.   DVT prophylaxis: Heparin. Code Status: Full code. Family Communication: Discussed with patient. Disposition Plan: Home. Consults called: Cardiology and nephrology. Admission status: Observation.   Rise Patience MD [facility] Pager [phone].  If 7PM-7AM, please contact night-coverage [email or web address] Password [facility]  08/07/2021, 4:19 PM

## 2021-08-08 ENCOUNTER — Observation Stay: Payer: Medicare Other

## 2021-08-08 ENCOUNTER — Encounter: Payer: Self-pay | Admitting: Internal Medicine

## 2021-08-08 DIAGNOSIS — E876 Hypokalemia: Secondary | ICD-10-CM | POA: Diagnosis not present

## 2021-08-08 DIAGNOSIS — R1011 Right upper quadrant pain: Secondary | ICD-10-CM | POA: Diagnosis not present

## 2021-08-08 DIAGNOSIS — N2581 Secondary hyperparathyroidism of renal origin: Secondary | ICD-10-CM | POA: Diagnosis not present

## 2021-08-08 DIAGNOSIS — I5021 Acute systolic (congestive) heart failure: Secondary | ICD-10-CM | POA: Diagnosis not present

## 2021-08-08 DIAGNOSIS — D696 Thrombocytopenia, unspecified: Secondary | ICD-10-CM

## 2021-08-08 DIAGNOSIS — N186 End stage renal disease: Secondary | ICD-10-CM | POA: Diagnosis not present

## 2021-08-08 DIAGNOSIS — R112 Nausea with vomiting, unspecified: Secondary | ICD-10-CM | POA: Diagnosis not present

## 2021-08-08 DIAGNOSIS — R079 Chest pain, unspecified: Secondary | ICD-10-CM | POA: Diagnosis not present

## 2021-08-08 DIAGNOSIS — D631 Anemia in chronic kidney disease: Secondary | ICD-10-CM | POA: Diagnosis not present

## 2021-08-08 DIAGNOSIS — I480 Paroxysmal atrial fibrillation: Secondary | ICD-10-CM | POA: Diagnosis not present

## 2021-08-08 DIAGNOSIS — I25810 Atherosclerosis of coronary artery bypass graft(s) without angina pectoris: Secondary | ICD-10-CM | POA: Diagnosis not present

## 2021-08-08 LAB — COMPREHENSIVE METABOLIC PANEL
ALT: 17 U/L (ref 0–44)
AST: 21 U/L (ref 15–41)
Albumin: 3.7 g/dL (ref 3.5–5.0)
Alkaline Phosphatase: 64 U/L (ref 38–126)
Anion gap: 13 (ref 5–15)
BUN: 36 mg/dL — ABNORMAL HIGH (ref 6–20)
CO2: 27 mmol/L (ref 22–32)
Calcium: 7.8 mg/dL — ABNORMAL LOW (ref 8.9–10.3)
Chloride: 97 mmol/L — ABNORMAL LOW (ref 98–111)
Creatinine, Ser: 7.02 mg/dL — ABNORMAL HIGH (ref 0.44–1.00)
GFR, Estimated: 6 mL/min — ABNORMAL LOW (ref >60)
Glucose, Bld: 81 mg/dL (ref 70–99)
Potassium: 4.5 mmol/L (ref 3.5–5.1)
Sodium: 137 mmol/L (ref 135–145)
Total Bilirubin: 1.1 mg/dL (ref 0.3–1.2)
Total Protein: 7.3 g/dL (ref 6.5–8.1)

## 2021-08-08 LAB — CBC
HCT: 37.3 % (ref 36.0–46.0)
Hemoglobin: 12 g/dL (ref 12.0–15.0)
MCH: 32.9 pg (ref 26.0–34.0)
MCHC: 32.2 g/dL (ref 30.0–36.0)
MCV: 102.2 fL — ABNORMAL HIGH (ref 80.0–100.0)
Platelets: 120 10*3/uL — ABNORMAL LOW (ref 150–400)
RBC: 3.65 MIL/uL — ABNORMAL LOW (ref 3.87–5.11)
RDW: 14.6 % (ref 11.5–15.5)
WBC: 3.8 10*3/uL — ABNORMAL LOW (ref 4.0–10.5)
nRBC: 0 % (ref 0.0–0.2)

## 2021-08-08 LAB — APTT
aPTT: 92 seconds — ABNORMAL HIGH (ref 24–36)
aPTT: 116 seconds — ABNORMAL HIGH (ref 24–36)

## 2021-08-08 LAB — HEPARIN LEVEL (UNFRACTIONATED): Heparin Unfractionated: >1.1 IU/mL — ABNORMAL HIGH (ref 0.30–0.70)

## 2021-08-08 LAB — HEPATITIS B SURFACE ANTIBODY,QUALITATIVE: Hep B S Ab: NONREACTIVE

## 2021-08-08 LAB — HEPATITIS B SURFACE ANTIGEN: Hepatitis B Surface Ag: NONREACTIVE

## 2021-08-08 MED ORDER — TRAMADOL HCL 50 MG PO TABS
50.0000 mg | ORAL_TABLET | Freq: Two times a day (BID) | ORAL | Status: DC | PRN
  Administered 2021-08-08: 50 mg via ORAL
  Filled 2021-08-08: qty 1

## 2021-08-08 MED ORDER — TRAMADOL HCL 50 MG PO TABS: 50.0000 mg | ORAL_TABLET | Freq: Three times a day (TID) | ORAL | Status: DC | PRN

## 2021-08-08 MED ORDER — LIDOCAINE HCL (PF) 1 % IJ SOLN
5.0000 mL | INTRAMUSCULAR | Status: DC | PRN
  Filled 2021-08-08: qty 5

## 2021-08-08 MED ORDER — SODIUM CHLORIDE 0.9 % IV SOLN: 100.0000 mL | INTRAVENOUS | Status: DC | PRN

## 2021-08-08 MED ORDER — TRAMADOL HCL 50 MG PO TABS: 50.0000 mg | ORAL_TABLET | Freq: Four times a day (QID) | ORAL | Status: DC | PRN

## 2021-08-08 MED ORDER — TECHNETIUM TC 99M MEBROFENIN IV KIT
5.0000 | PACK | Freq: Once | INTRAVENOUS | Status: AC
  Administered 2021-08-08: 4.96 via INTRAVENOUS

## 2021-08-08 MED ORDER — PENTAFLUOROPROP-TETRAFLUOROETH EX AERO
1.0000 "application " | INHALATION_SPRAY | CUTANEOUS | Status: DC | PRN
  Filled 2021-08-08: qty 30

## 2021-08-08 MED ORDER — SODIUM CHLORIDE 0.9 % IV SOLN
2.0000 g | INTRAVENOUS | Status: DC
  Administered 2021-08-08 – 2021-08-09 (×2): 2 g via INTRAVENOUS
  Filled 2021-08-08: qty 2
  Filled 2021-08-08 (×2): qty 20

## 2021-08-08 MED ORDER — HEPARIN SODIUM (PORCINE) 1000 UNIT/ML DIALYSIS: 1000.0000 [IU] | INTRAMUSCULAR | Status: DC | PRN

## 2021-08-08 MED ORDER — PENTAFLUOROPROP-TETRAFLUOROETH EX AERO
INHALATION_SPRAY | CUTANEOUS | Status: AC
  Filled 2021-08-08: qty 30

## 2021-08-08 MED ORDER — CHLORHEXIDINE GLUCONATE CLOTH 2 % EX PADS
6.0000 | MEDICATED_PAD | Freq: Every day | CUTANEOUS | Status: DC
  Administered 2021-08-08 – 2021-08-09 (×2): 6 via TOPICAL

## 2021-08-08 MED ORDER — LIDOCAINE-PRILOCAINE 2.5-2.5 % EX CREA
1.0000 "application " | TOPICAL_CREAM | CUTANEOUS | Status: DC | PRN
  Administered 2021-08-08: 1 via TOPICAL
  Filled 2021-08-08 (×2): qty 5

## 2021-08-08 MED ORDER — GUAIFENESIN-DM 100-10 MG/5ML PO SYRP
5.0000 mL | ORAL_SOLUTION | ORAL | Status: DC | PRN
  Administered 2021-08-08: 5 mL via ORAL
  Filled 2021-08-08 (×2): qty 5

## 2021-08-08 MED ORDER — ALTEPLASE 2 MG IJ SOLR: 2.0000 mg | Freq: Once | INTRAMUSCULAR | Status: DC | PRN

## 2021-08-08 NOTE — Progress Notes (Signed)
[facility]  ROUNDING NOTE   Subjective:   CARA THAXTON is a 61 year old African-American female with past medical histories including CAD with CABG and end-stage renal disease on dialysis.  Patient presents to the emergency department with complaints of chest pain that has spent 2 days.  Patient has been admitted for RUQ pain [R10.11] Chest pain, moderate coronary artery risk [R07.9] Chest pain [R07.9]  Patient is known to our clinic and receives outpatient dialysis treatments at [facility] on MWF schedule, supervised by Dr. Candiss Norse.  Patient reports she received her last dialysis treatment on Wednesday, no complications or chest pain at that time.  She states her chest pain began late that evening.  Unable to identify any precipitating events.  The pain and midsternal and upper abdominal.  Patient does have a wet productive cough.  Reports cough developed around the time of chest pain.  Does report some nausea and vomiting.  Poor appetite due to nausea.  Denies shortness of breath.  Labs on admission include sodium 138 potassium 4.5, and creatinine 6 with GFR 8.  BNP elevated at greater than 2600.  Chest x-ray shows pulmonary vascular congestion.  Abdominal ultrasounds show gallstones.  We have been consulted to manage dialysis needs during this admission   Objective:  Vital signs in last 24 hours:  Temp:  [97.2 F (36.2 C)-99 F (37.2 C)] 97.8 F (36.6 C) (02/24 0803) Pulse Rate:  [63-89] 83 (02/24 0803) Resp:  [16-19] 17 (02/24 0803) BP: (83-131)/(58-115) 106/70 (02/24 0803) SpO2:  [96 %-100 %] 100 % (02/24 0803) Weight:  [74.8 kg] 74.8 kg (02/23 1412)  Weight change:  Filed Weights   08/07/21 1412  Weight: 74.8 kg    Intake/Output: I/O last 3 completed shifts: In: 167.6 [P.O.:120; I.V.:47.6] Out: -    Intake/Output this shift:  No intake/output data recorded.  Physical Exam: General: NAD, sitting up in bed, awake and alert  Head:  Normocephalic, atraumatic. Moist oral mucosal membranes  Eyes: Anicteric  Lungs:  Clear to auscultation, normal effort  Heart: Regular rate and rhythm  Abdomen:  Soft, tender right upper quadrant,   Extremities: No peripheral edema.  Neurologic: Nonfocal, moving all four extremities  Skin: No lesions  Access: Left aVF    Basic Metabolic Panel: Recent Labs  Lab 08/07/21 1416 08/08/21 0446  NA 138 137  K 4.5 4.5  CL 95* 97*  CO2 28 27  GLUCOSE 101* 81  BUN 29* 36*  CREATININE 6.00* 7.02*  CALCIUM 8.2* 7.8*    Liver Function Tests: Recent Labs  Lab 08/07/21 1416 08/08/21 0446  AST 28 21  ALT 19 17  ALKPHOS 64 64  BILITOT 1.2 1.1  PROT 7.2 7.3  ALBUMIN 3.9 3.7   No results for input(s): LIPASE, AMYLASE in the last 168 hours. No results for input(s): AMMONIA in the last 168 hours.  CBC: Recent Labs  Lab 08/07/21 1416 08/08/21 0446  WBC 4.4 3.8*  NEUTROABS 3.1  --   HGB 12.1 12.0  HCT 37.8 37.3  MCV 104.1* 102.2*  PLT 124* 120*    Cardiac Enzymes: No results for input(s): CKTOTAL, CKMB, CKMBINDEX, TROPONINI in the last 168 hours.  BNP: Invalid input(s): POCBNP  CBG: No results for input(s): GLUCAP in the last 168 hours.  Microbiology: Results for orders placed or performed during the hospital encounter of 08/07/21  Resp Panel by RT-PCR (Flu A&B, Covid) Nasopharyngeal Swab     Status: None   Collection Time: 08/07/21  3:18 PM   Specimen: Nasopharyngeal Swab; Nasopharyngeal(NP) swabs in vial transport medium  Result Value Ref Range Status   SARS Coronavirus 2 by RT PCR NEGATIVE NEGATIVE Final    Comment: (NOTE) SARS-CoV-2 target nucleic acids are NOT DETECTED.  The SARS-CoV-2 RNA is generally detectable in upper respiratory specimens during the acute phase of infection. The lowest concentration of SARS-CoV-2 viral copies this assay can detect is 138 copies/mL. A negative result does not preclude SARS-Cov-2 infection and should not be used as the  sole basis for treatment or other patient management decisions. A negative result may occur with  improper specimen collection/handling, submission of specimen other than nasopharyngeal swab, presence of viral mutation(s) within the areas targeted by this assay, and inadequate number of viral copies(<138 copies/mL). A negative result must be combined with clinical observations, patient history, and epidemiological information. The expected result is Negative.  Fact Sheet for Patients:  EntrepreneurPulse.com.au  Fact Sheet for Healthcare Providers:  IncredibleEmployment.be  This test is no t yet approved or cleared by the [place] FDA and  has been authorized for detection and/or diagnosis of SARS-CoV-2 by FDA under an Emergency Use Authorization (EUA). This EUA will remain  in effect (meaning this test can be used) for the duration of the COVID-19 declaration under Section 564(b)(1) of the Act, 21 U.S.C.section 360bbb-3(b)(1), unless the authorization is terminated  or revoked sooner.       Influenza A by PCR NEGATIVE NEGATIVE Final   Influenza B by PCR NEGATIVE NEGATIVE Final    Comment: (NOTE) The Xpert Xpress SARS-CoV-2/FLU/RSV plus assay is intended as an aid in the diagnosis of influenza from Nasopharyngeal swab specimens and should not be used as a sole basis for treatment. Nasal washings and aspirates are unacceptable for Xpert Xpress SARS-CoV-2/FLU/RSV testing.  Fact Sheet for Patients: EntrepreneurPulse.com.au  Fact Sheet for Healthcare Providers: IncredibleEmployment.be  This test is not yet approved or cleared by the [place] FDA and has been authorized for detection and/or diagnosis of SARS-CoV-2 by FDA under an Emergency Use Authorization (EUA). This EUA will remain in effect (meaning this test can be used) for the duration of the COVID-19 declaration under Section 564(b)(1) of the  Act, 21 U.S.C. section 360bbb-3(b)(1), unless the authorization is terminated or revoked.  Performed at [facility], [address] [place], [place] [address]     Coagulation Studies: Recent Labs    08/07/21 1850  LABPROT 19.2*  INR 1.6*    Urinalysis: No results for input(s): COLORURINE, LABSPEC, PHURINE, GLUCOSEU, HGBUR, BILIRUBINUR, KETONESUR, PROTEINUR, UROBILINOGEN, NITRITE, LEUKOCYTESUR in the last 72 hours.  Invalid input(s): APPERANCEUR    Imaging: DG Chest 2 View  Result Date: 08/07/2021 CLINICAL DATA:  Chest pain EXAM: CHEST - 2 VIEW COMPARISON:  Radiograph 03/11/2021 FINDINGS: Cardiomegaly. Prior median sternotomy CABG. There is no focal airspace consolidation. Mild central pulmonary vascular congestion. There is no large pleural effusion. There is no visible pneumothorax. No acute osseous abnormality. IMPRESSION: Cardiomegaly with central pulmonary vascular congestion. Electronically Signed   By: Maurine Simmering M.D.   On: 08/07/2021 15:16   US Abdomen Limited RUQ (LIVER/GB)  Result Date: 08/07/2021 CLINICAL DATA:  Right upper quadrant abdominal pain for 2 days. EXAM: ULTRASOUND ABDOMEN LIMITED RIGHT UPPER QUADRANT COMPARISON:  CT scan 02/09/2021 FINDINGS: Gallbladder: Single 7 mm mobile gallstone in the gallbladder. Moderate gallbladder wall thickening measuring a maximum of 4.7 mm. Small amount of pericholecystic fluid also noted. No sonographic Murphy sign elicited. Common bile duct: Diameter: 5.4  mm Liver: No hepatic lesions or intrahepatic biliary dilatation. Perihepatic fluid is noted. Portal vein is patent on color Doppler imaging with normal direction of blood flow towards the liver. Other: Echogenic right kidney consistent with known renal failure. IMPRESSION: 1. Single multiple echogenic gallstone in the gallbladder. 2. Gallbladder wall thickening pericholecystic fluid/ascites. 3. Normal caliber common bile duct and no intrahepatic biliary dilatation.  Electronically Signed   By: Marijo Sanes M.D.   On: 08/07/2021 16:51     Medications:    cefTRIAXone (ROCEPHIN)  IV 2 g (08/08/21 0906)   heparin 950 Units/hr (08/07/21 1856)    amiodarone  200 mg Oral Daily   aspirin EC  81 mg Oral Daily   carvedilol  3.125 mg Oral BID   Chlorhexidine Gluconate Cloth  6 each Topical Q0600   levothyroxine  75 mcg Oral QAC breakfast   rosuvastatin  20 mg Oral QHS   acetaminophen **OR** acetaminophen, guaiFENesin-dextromethorphan, lidocaine (PF), lidocaine-prilocaine, pentafluoroprop-tetrafluoroeth  Assessment/ Plan:  Ms. DIANELLY FERRAN is a 61 y.o.  female  JENIECE HANNIS is a 61 year old African-American female with past medical histories including CAD with CABG and end-stage renal disease on dialysis.  Patient presents to the emergency department with complaints of chest pain that has spent 2 days.  Patient has been admitted for RUQ pain [R10.11] Chest pain, moderate coronary artery risk [R07.9] Chest pain [R07.9]   Anemia with chronic kidney disease.  Macrocytic Macera received outpatient.  Hemoglobin currently 12.0.  Within acceptable range.  We will continue to monitor  2.  End-stage renal disease on hemodialysis.  Scheduled to receive dialysis later today to maintain outpatient schedule.  UF goal 1.5 L as tolerated due to recent nausea and vomiting.  Next treatment scheduled for Monday  3.  Secondary hyperparathyroidism.  Calcium currently 7.8, below target but will continue to monitor.  Currently prescribed Renvela outpatient with meals.    LOS: 0   2/24/20231:24 PM

## 2021-08-08 NOTE — Progress Notes (Signed)
Progress Note   Patient: [name] WUJ:[id number] DOB: December 07, 1960 DOA: 08/07/2021     0  DOS: the patient was seen and examined on 08/08/2021   Brief hospital course: This 61 years old female with PMH significant of CAD s/p CABG , ESRD on hemodialysis on MWF, presented in the ED with atypical chest pain for 2 days.  Patient reports having regular dialysis and then she has developed intermittent left-sided chest pain. She also reports having productive cough and congestion for the same duration.  Patient also reports right upper quadrant abdominal pain, associated with nausea and vomiting. In the ED she is found to have a BNP 2600, troponin 62, COVID-negative. Nephrology and cardiology is consulted.  Patient is having HIDA scan for RUQ pain  Assessment and Plan: * Chest pain- (present on admission) Patient presented with chest pain for 48 hours that started after dialysis.  It seems reproducible on exam.  Gets worse with deep breathing.  Troponin trending down 70> 65>62 EKG no acute ischemic changes. Cardiology is consulted.  Echocardiogram completed report pending. No plan for any invasive cardiac procedure at this time   ESRD on hemodialysis [facility]) Patient reports being very compliant with dialysis. BNP 2496.  She had 13 pound weight gain in the past week. Nephrology consulted.  Patient is scheduled to have dialysis for removal of excess fluid today.  RUQ pain Patient reports history of liver cirrhosis,  US abdomen showed single multi echoic gallstone,  HIDA scan completed,  follow-up report  Thrombocytopenia ([place]) Could be secondary to liver cirrhosis. Continue to monitor  Hypothyroidism- (present on admission) Continue Synthroid.  Paroxysmal atrial fibrillation ([place])- (present on admission) Heart rate is well controlled.  Continue amiodarone. Hold Eliquis and continue heparin gtt in anticipation of possible procedure.  Restart Eliquis if no plan for any  intervention.  S/P CABG x 3 Continue aspirin, Coreg, Crestor.   Subjective: Patient was seen and examined at bedside.  Overnight events noted. Patient still reports having intermittent chest pain,  seems musculoskeletal. She is sitting comfortably,  not in any acute distress.  Physical Exam: Vitals:   08/07/21 2206 08/08/21 0400 08/08/21 0458 08/08/21 0803  BP: 98/73 97/75  106/70  Pulse: 89 89  83  Resp: 18 18  17   Temp: 97.9 F (36.6 C) (!) 97.2 F (36.2 C)  97.8 F (36.6 C)  TempSrc:    Oral  SpO2: 100% 100% 100% 100%  Weight:      Height:       General exam: Appears comfortable, not in any acute distress.  Deconditioned. Respiratory system: CTA bilaterally, no wheezing, no crackles, respiratory for normal. Cardiovascular system: S1-S2 heard, regular rate and rhythm, no murmur. Chest wall: Chest wall tenderness noted on the left. Gastrointestinal system: Soft, nondistended, RUQ tenderness+, BS+ Central nervous system: Alert and oriented x3, no neurological deficits. Extremities: No edema, no cyanosis, no clubbing. Psychiatry: Mood, insight, judgment normal   Data Reviewed: I have Reviewed nursing notes, Vitals, and Lab results since pt's last encounter. Pertinent lab results CBC BMP BNP I have ordered test including CBC BMP I have independently visualized and interpreted EKG which showed EKG: normal EKG, normal sinus rhythm, unchanged from previous tracings, normal sinus rhythm. I have reviewed the last note from cardiology, nephrology,  I have discussed pt's care plan and test results with patient.   Family Communication: No family at bedside  Disposition: Status is: Observation The patient remains OBS appropriate and will d/c before 2 midnights.  Admitted for atypical chest pain found to have fluid overload undergoing hemodialysis.  Right upper quadrant tenderness had a HIDA scan report pending.   Planned Discharge Destination: Home   Time spent: 50  minutes  Author: Shawna Clamp, MD 08/08/2021 2:36 PM  For on call review [email or web address].

## 2021-08-08 NOTE — Progress Notes (Signed)
[place] for heparin initiation and monitoring Indication: chest pain/ACS  Allergies  Allergen Reactions   Morphine And Related Hives   Levaquin [Levofloxacin] Itching    Severe itching; prickly sensation   Other    Latex Rash   Tape Rash    Patient Measurements: Height: 5\' 6"  (167.6 cm) Weight: 74.8 kg (165 lb) IBW/kg (Calculated) : 59.3 Heparin Dosing Weight: 74.3 kg  Vital Signs: Temp: 97.2 F (36.2 C) (02/24 0400) Temp Source: Oral (02/23 2040) BP: 97/75 (02/24 0400) Pulse Rate: 89 (02/24 0400)  Labs: Recent Labs    08/07/21 1416 08/07/21 1850 08/07/21 2208 08/08/21 0446  HGB 12.1  --   --  12.0  HCT 37.8  --   --  37.3  PLT 124*  --   --  120*  APTT  --  33  --  92*  LABPROT  --  19.2*  --   --   INR  --  1.6*  --   --   HEPARINUNFRC  --  >1.10*  --  >1.10*  CREATININE 6.00*  --   --  7.02*  TROPONINIHS 70* 65* 62*  --      Estimated Creatinine Clearance: 9 mL/min (A) (by C-G formula based on SCr of 7.02 mg/dL (H)).   Medical History: Past Medical History:  Diagnosis Date   Adult behavior problems    frontal lobe CVA   Anemia    chronic disease   Cardiomyopathy ([place])    CHF (congestive heart failure) (HCC)    Chicken pox    Coronary artery disease    GERD (gastroesophageal reflux disease)    Heart failure (HCC)    Hepatitis    history of hep c   History of bipolar disorder    Hyperlipidemia    Hypertension    Myocardial infarction [facility])    Obesity    Paroxysmal atrial fibrillation ([place])    Peripheral vascular disease ([place])    Renal failure    Renal insufficiency    Stroke [facility]) 2011   Superficial incisional surgical site infection 03/14/2014    Assessment: 61 y.o. female with history of CAD s/p CABG  in February 2020, atrial fibrillation on apixaban (last dose 02/23 1000), ESRD on hemodialysis on MWF has had dialysis yesterday has been experiencing chest pain for the last 48 hours. She was  admitted and treated with IV heparin in 2020 with her weight approximately the same as current measurement.   Goal of Therapy:  Heparin level 0.3-0.7 units/ml aPTT 66 - 102 seconds Monitor platelets by anticoagulation protocol: Yes  2/24 0446 aPTT 92, therapeutic HL >1.1   Plan:  Continue heparin infusion at 950 units/hr Recheck aPTT level in 8 hours to confirm We will use aPTT levels to guide infusion rates until aPTT and heparin levels correlate Continue to monitor H&H and platelets  Renda Rolls, PharmD, [facility] 08/08/2021 6:24 AM

## 2021-08-08 NOTE — Progress Notes (Signed)
Patient with scheduled 3 hour treatment post procedure with cardiology. Patient with LUA AVF, thrill and bruit present. Targeted UF not achieved as venous needle was dislodged with patient's movement, on call Nephrologist notified. AMA form unavailable for signature, patient request to continue treatment next day. Patient will return on next day for shorten treatment. Patient to be monitor for excessive bleeding upon return to unit.

## 2021-08-08 NOTE — Assessment & Plan Note (Signed)
Could be secondary to liver cirrhosis. Continue to monitor

## 2021-08-08 NOTE — Consult Note (Cosign Needed)
[place] NOTE       Patient ID: [name] MRN: [id number] DOB/AGE: 1960-12-27 61 y.o.  Admit date: 08/07/2021 Referring Physician Dr. Shawna Clamp Primary Physician  Primary Cardiologist Dr. Neoma Laming  Reason for Consultation chest pain, elevated trop  HPI: The patient is a 60 year old female with a past medical history notable for ESRD on MWF dialysis, CAD s/p CABG with cardiac cath performed 07/2018 showing patent grafts x3, HFrEF (LVEF 25-30% with global hypokinesis, G3 DD, mild MR, mild to moderate TR 07/26/2020), paroxysmal atrial fibrillation on amiodarone and Eliquis, chronic thrombocytopenia, history of cirrhosis who presented to [facility] ED 08/07/2021 with chest pain, cough with productive sputum.  Cardiology is consulted because of her chest pain and troponin elevation.  The patient reports 2 days ago she started experiencing pain in the center of her chest after she finished dialysis.  She said the pain felt both "sharp and dull" and was worsened with coughing and with deep inspiration.  She said the pain has been present and constant for the past 48 hours and her chest wall is also tender to palpation during exam today.  She says she has had a productive cough for the past 3 days, denies sick contacts, fever.  She admits to generalized shortness of breath, dizziness and just not feeling like her self.  She admits to compliance with her dialysis treatments.  On 2/7 the patient apparently took bowel prep for a colonoscopy but did not actually make it to the procedure and thinks her weight has been up since then, form 152 lbs to 165lbs.   In the ED she received 325 mg of aspirin and her Eliquis was held and she was started on a heparin drip in addition to empiric ceftriaxone.  Vitals are notable for blood pressure 106/70 and heart rate 83.  SPO2 100% on 2 L by nasal cannula.  Labs on admission are notable for a creatinine of 7.02, GFR 6, BNP elevated to  2696, troponin minimally elevated and downtrending from 70-65-62.  Procalcitonin 0.24, H&H 12/37.3  Chest x-ray showed pulmonary vascular congestion without pleural effusion.  Review of systems complete and found to be negative unless listed above     Past Medical History:  Diagnosis Date   Adult behavior problems    frontal lobe CVA   Anemia    chronic disease   Cardiomyopathy ([place])    CHF (congestive heart failure) (HCC)    Chicken pox    Coronary artery disease    GERD (gastroesophageal reflux disease)    Heart failure (HCC)    Hepatitis    history of hep c   History of bipolar disorder    Hyperlipidemia    Hypertension    Myocardial infarction [facility])    Obesity    Paroxysmal atrial fibrillation ([place])    Peripheral vascular disease ([place])    Renal failure    Renal insufficiency    Stroke [facility]) 2011   Superficial incisional surgical site infection 03/14/2014    Past Surgical History:  Procedure Laterality Date   A/V FISTULAGRAM Left 03/30/2019   Procedure: A/V FISTULAGRAM;  Surgeon: Algernon Huxley, MD;  Location: [place] CV LAB;  Service: Cardiovascular;  Laterality: Left;   A/V FISTULAGRAM Left 10/06/2019   Procedure: A/V FISTULAGRAM;  Surgeon: Algernon Huxley, MD;  Location: [place] CV LAB;  Service: Cardiovascular;  Laterality: Left;   A/V FISTULAGRAM Left 05/19/2021   Procedure: A/V FISTULAGRAM;  Surgeon: Algernon Huxley,  MD;  Location: [place] CV LAB;  Service: Cardiovascular;  Laterality: Left;   COLONOSCOPY     COLONOSCOPY WITH PROPOFOL N/A 04/22/2021   Procedure: COLONOSCOPY WITH PROPOFOL;  Surgeon: Lesly Rubenstein, MD;  Location: [facility] ENDOSCOPY;  Service: Endoscopy;  Laterality: N/A;   CORONARY ANGIOPLASTY WITH STENT PLACEMENT  2013   CORONARY ARTERY BYPASS GRAFT     DIALYSIS FISTULA CREATION     ESOPHAGOGASTRODUODENOSCOPY     ESOPHAGOGASTRODUODENOSCOPY N/A 04/22/2021   Procedure: ESOPHAGOGASTRODUODENOSCOPY (EGD);  Surgeon: Lesly Rubenstein,  MD;  Location: [facility] ENDOSCOPY;  Service: Endoscopy;  Laterality: N/A;   FLEXIBLE SIGMOIDOSCOPY N/A 02/23/2019   Procedure: FLEXIBLE SIGMOIDOSCOPY;  Surgeon: Lollie Sails, MD;  Location: [facility] ENDOSCOPY;  Service: Endoscopy;  Laterality: N/A;   LEFT HEART CATH AND CORS/GRAFTS ANGIOGRAPHY N/A 08/08/2018   Procedure: LEFT HEART CATH AND CORS/GRAFTS ANGIOGRAPHY;  Surgeon: Dionisio David, MD;  Location: [place] CV LAB;  Service: Cardiovascular;  Laterality: N/A;   LEFT HEART CATH AND CORS/GRAFTS ANGIOGRAPHY N/A 01/30/2019   Procedure: LEFT HEART CATH AND CORS/GRAFTS ANGIOGRAPHY;  Surgeon: Dionisio David, MD;  Location: [place] CV LAB;  Service: Cardiovascular;  Laterality: N/A;   percutaneous insertion intra aortic balloon right cath     ultrasound guided pericardiocentesis      Medications Prior to Admission  Medication Sig Dispense Refill Last Dose   amiodarone (PACERONE) 200 MG tablet Take 200 mg by mouth daily.   08/07/2021 at 1000   apixaban (ELIQUIS) 5 MG TABS tablet Take 5 mg by mouth 2 (two) times daily.   08/07/2021 at 1000   carvedilol (COREG) 6.25 MG tablet Take 6.25 mg by mouth 2 (two) times daily.   08/07/2021 at 1000   cinacalcet (SENSIPAR) 90 MG tablet Take 90 mg by mouth daily.   08/07/2021 at 1000   dapagliflozin propanediol (FARXIGA) 10 MG TABS tablet Take 10 mg by mouth daily.   08/07/2021 at 1000   hydrOXYzine (ATARAX/VISTARIL) 50 MG tablet Take 50 mg by mouth at bedtime.   08/06/2021 at 2100   levothyroxine (SYNTHROID) 75 MCG tablet Take 75 mcg by mouth daily before breakfast.    08/07/2021 at 0800   lidocaine-prilocaine (EMLA) cream Apply 1 application topically daily as needed (port access).   Unknown at PRN   losartan (COZAAR) 25 MG tablet Take 12.5 mg by mouth daily.   08/07/2021 at 1000   midodrine (PROAMATINE) 10 MG tablet Take 10 mg by mouth as directed.      oxyCODONE-acetaminophen (PERCOCET/ROXICET) 5-325 MG tablet Take 1 tablet by mouth 2 (two) times daily  as needed for moderate pain.   Unknown at PRN   pantoprazole (PROTONIX) 40 MG tablet Take 40 mg by mouth daily.   08/07/2021   ranolazine (RANEXA) 1000 MG SR tablet Take 1 tablet (1,000 mg total) by mouth 2 (two) times daily. Increased from 500 mg 2 times daily. (Patient taking differently: Take 1,000 mg by mouth 2 (two) times daily.) 60 tablet 2 Unknown at Unknown   sevelamer carbonate (RENVELA) 800 MG tablet Take 3,200 mg by mouth 3 (three) times daily with meals.   Unknown at Unknown   Social History   Socioeconomic History   Marital status: Single    Spouse name: Not on file   Number of children: Not on file   Years of education: Not on file   Highest education level: Not on file  Occupational History   Not on file  Tobacco Use   Smoking  status: Former    Packs/day: 0.25    Years: 20.00    Pack years: 5.00    Types: Cigarettes   Smokeless tobacco: Never   Tobacco comments:    smokes one a day  Vaping Use   Vaping Use: Never used  Substance and Sexual Activity   Alcohol use: No   Drug use: No   Sexual activity: Not Currently    Birth control/protection: Post-menopausal  Other Topics Concern   Not on file  Social History Narrative   Not on file   Social Determinants of Health   Financial Resource Strain: Not on file  Food Insecurity: Not on file  Transportation Needs: Not on file  Physical Activity: Not on file  Stress: Not on file  Social Connections: Not on file  Intimate Partner Violence: Not on file    Family History  Problem Relation Age of Onset   Hypertension Mother    Ovarian cancer Mother    Diabetes type II Mother    Hypertension Father    Kidney disease Father    Hypertension Sister    Diabetes type II Maternal Grandmother    Breast cancer Maternal Grandmother    Breast cancer Maternal Aunt    Hypertension Other    Cancer Other    Renal Disease Other       Review of systems complete and found to be negative unless listed above    PHYSICAL  EXAM General: Middle-aged appearing black female, well nourished, in no acute distress. HEENT:  Normocephalic and atraumatic. Neck:  No JVD.  Lungs: Normal respiratory effort on 2 L by nasal cannula.  Productive cough with deep inspiration, no wheezes Heart: HRRR . Normal S1 and S2 without gallops or murmurs. Radial & DP pulses 2+ bilaterally. Chest: tender to light palpation of center chest wall. Abdomen: Non-distended appearing.  Msk: Normal strength and tone for age. Extremities: Warm and well perfused. No clubbing, cyanosis.  LUE fistula. Neuro: Alert and oriented X 3. Psych:  Answers questions appropriately.   Labs:   Lab Results  Component Value Date   WBC 3.8 (L) 08/08/2021   HGB 12.0 08/08/2021   HCT 37.3 08/08/2021   MCV 102.2 (H) 08/08/2021   PLT 120 (L) 08/08/2021    Recent Labs  Lab 08/08/21 0446  NA 137  K 4.5  CL 97*  CO2 27  BUN 36*  CREATININE 7.02*  CALCIUM 7.8*  PROT 7.3  BILITOT 1.1  ALKPHOS 64  ALT 17  AST 21  GLUCOSE 81   Lab Results  Component Value Date   CKTOTAL 42 06/23/2014   CKMB 1.2 06/23/2014   TROPONINI 0.07 (HH) 08/25/2018    Lab Results  Component Value Date   CHOL 140 01/18/2019   CHOL 79 06/14/2014   CHOL 127 02/10/2014   Lab Results  Component Value Date   HDL 43 01/18/2019   HDL 35 (L) 06/14/2014   HDL 53 02/10/2014   Lab Results  Component Value Date   LDLCALC 67 01/18/2019   LDLCALC 27 06/14/2014   LDLCALC 53 02/10/2014   Lab Results  Component Value Date   TRIG 148 01/18/2019   TRIG 85 06/14/2014   TRIG 105 02/10/2014   Lab Results  Component Value Date   CHOLHDL 3.3 01/18/2019   No results found for: LDLDIRECT    Radiology: DG Chest 2 View  Result Date: 08/07/2021 CLINICAL DATA:  Chest pain EXAM: CHEST - 2 VIEW COMPARISON:  Radiograph 03/11/2021 FINDINGS: Cardiomegaly.  Prior median sternotomy CABG. There is no focal airspace consolidation. Mild central pulmonary vascular congestion. There is no large  pleural effusion. There is no visible pneumothorax. No acute osseous abnormality. IMPRESSION: Cardiomegaly with central pulmonary vascular congestion. Electronically Signed   By: Maurine Simmering M.D.   On: 08/07/2021 15:16   US Abdomen Limited RUQ (LIVER/GB)  Result Date: 08/07/2021 CLINICAL DATA:  Right upper quadrant abdominal pain for 2 days. EXAM: ULTRASOUND ABDOMEN LIMITED RIGHT UPPER QUADRANT COMPARISON:  CT scan 02/09/2021 FINDINGS: Gallbladder: Single 7 mm mobile gallstone in the gallbladder. Moderate gallbladder wall thickening measuring a maximum of 4.7 mm. Small amount of pericholecystic fluid also noted. No sonographic Murphy sign elicited. Common bile duct: Diameter: 5.4 mm Liver: No hepatic lesions or intrahepatic biliary dilatation. Perihepatic fluid is noted. Portal vein is patent on color Doppler imaging with normal direction of blood flow towards the liver. Other: Echogenic right kidney consistent with known renal failure. IMPRESSION: 1. Single multiple echogenic gallstone in the gallbladder. 2. Gallbladder wall thickening pericholecystic fluid/ascites. 3. Normal caliber common bile duct and no intrahepatic biliary dilatation. Electronically Signed   By: Marijo Sanes M.D.   On: 08/07/2021 16:51    ECHO LVEF 25-30% with global hypokinesis, G3 DD, mild MR, mild to moderate TR 07/26/2020  Myoview 01/20/2019 The left ventricular ejection fraction is severely decreased (<30%). Findings consistent with prior myocardial infarction with peri-infarct ischemia. This is a high risk study.  Cardiac cath 01/30/2019 Prox RCA lesion is 100% stenosed. Mid LAD lesion is 100% stenosed. Prox Cx to Mid Cx lesion is 90% stenosed.   All grafts patent, including LIMA to LAD, SVG to Om and PDA, with extesive worsening of native disease. Echo had EF 28%. Treat medically  TELEMETRY reviewed by me: Sinus rhythm rate of 90 during interview, short period of atrial fibrillation this morning around 7 AM with some  PVCs.  EKG reviewed by me: Sinus rhythm 85 RBBB similar in appearance to EKG from 02/2021  ASSESSMENT AND PLAN:  The patient is a 61 year old female with a past medical history notable for ESRD on MWF dialysis, CAD s/p CABG 2015 with cardiac cath performed 07/2018 showing patent grafts x3, HFrEF (LVEF 25-30% with global hypokinesis, G3 DD, mild MR, mild to moderate TR 07/26/2020), paroxysmal atrial fibrillation on amiodarone and Eliquis, chronic thrombocytopenia, history of cirrhosis who presented to [facility] ED 08/07/2021 with chest pain, cough with productive sputum.  Cardiology is consulted because of her chest pain and troponin elevation.  #Atypical chest pain #CAD s/p CABG 2015 #HFrEF LVEF 25-30% #Paroxysmal atrial fibrillation on Eliquis and amiodarone #ESRD on MWF dialysis The patient presented with 48 hours of chest pain that started after she finished dialysis.  She says it is worse with coughing and with deep inspiration and is reproducible on physical exam today.  She admits to productive cough that she is unsure started before or after she started having chest pain.  Her troponins are reassuring with peak at 70 and downtrending from there.  Her BNP is significantly elevated to 2600, reports compliance with dialysis although she has had a 13 pound weight gain in the past week.  She has had no acute EKG changes and her reproducible and pleuritic chest pain are reassuring that her symptoms are due to a noncardiac cause ?volume overload and coughing -Continue current dialysis schedule for removal of volume.  Patient does not make urine. -Agree with empiric antibiotic therapy, as per primary team -S/p 324 mg aspirin, continue baby aspirin  daily -Agree with holding Eliquis while inpatient and placing on heparin drip if any GI procedures are warranted.  Okay to restart Eliquis from a cardiac standpoint. -Continue home Coreg 3.125 twice daily, amiodarone 200 mg, Crestor 20 mg, midodrine -Ordered  echocardiogram complete, will follow results -Favor no invasive cardiac procedures at this time.  This patient's plan of care was discussed and created with Dr. Lujean Amel and he is in agreement.  Signed: Tristan Schroeder , PA-C 08/08/2021, 11:12 AM [facility] Cardiology

## 2021-08-08 NOTE — Assessment & Plan Note (Signed)
Patient reports being very compliant with dialysis. BNP 2496.  She had 13 pound weight gain in the past week. Nephrology consulted.  Patient is scheduled to have dialysis for removal of excess fluid today.

## 2021-08-08 NOTE — Assessment & Plan Note (Signed)
Continue aspirin, Coreg, Crestor.

## 2021-08-08 NOTE — Assessment & Plan Note (Signed)
Patient reports history of liver cirrhosis,  US abdomen showed single multi echoic gallstone,  HIDA scan completed,  follow-up report

## 2021-08-08 NOTE — Plan of Care (Signed)
Placed in bed on 2A, oriented to unit. Heparin gtt infusing as ordered. Reports pain to rib cage that worsens with cough, managed with ordered APAP -> asleep. Placed on remote telemetry. Denied other complaints.   Temp:  [97.9 F (36.6 C)-99 F (37.2 C)] 97.9 F (36.6 C) (02/23 2206) Pulse Rate:  [63-89] 89 (02/23 2206) Resp:  [16-19] 18 (02/23 2206) BP: (83-131)/(58-115) 98/73 (02/23 2206) SpO2:  [96 %-100 %] 100 % (02/23 2206) Weight:  [74.8 kg] 74.8 kg (02/23 1412)  Problem: Education: Goal: Knowledge of General Education information will improve Description: Including pain rating scale, medication(s)/side effects and non-pharmacologic comfort measures Outcome: Progressing   Problem: Health Behavior/Discharge Planning: Goal: Ability to manage health-related needs will improve Outcome: Progressing   Problem: Clinical Measurements: Goal: Ability to maintain clinical measurements within normal limits will improve Outcome: Progressing Goal: Will remain free from infection Outcome: Progressing Goal: Diagnostic test results will improve Outcome: Progressing Goal: Respiratory complications will improve Outcome: Progressing Goal: Cardiovascular complication will be avoided Outcome: Progressing   Problem: Activity: Goal: Risk for activity intolerance will decrease Outcome: Progressing   Problem: Nutrition: Goal: Adequate nutrition will be maintained Outcome: Progressing   Problem: Coping: Goal: Level of anxiety will decrease Outcome: Progressing   Problem: Elimination: Goal: Will not experience complications related to bowel motility Outcome: Progressing Goal: Will not experience complications related to urinary retention Outcome: Progressing   Problem: Pain Managment: Goal: General experience of comfort will improve Outcome: Progressing   Problem: Safety: Goal: Ability to remain free from injury will improve Outcome: Progressing   Problem: Skin Integrity: Goal:  Risk for impaired skin integrity will decrease Outcome: Progressing

## 2021-08-08 NOTE — Assessment & Plan Note (Signed)
Continue Synthroid °

## 2021-08-08 NOTE — Progress Notes (Signed)
[place] for heparin initiation and monitoring Indication: chest pain/ACS  Allergies  Allergen Reactions   Morphine And Related Hives   Levaquin [Levofloxacin] Itching    Severe itching; prickly sensation   Other    Latex Rash   Tape Rash    Patient Measurements: Height: 5\' 6"  (167.6 cm) Weight: 74.8 kg (165 lb) IBW/kg (Calculated) : 59.3 Heparin Dosing Weight: 74.3 kg  Vital Signs: Temp: 97.5 F (36.4 C) (02/24 1619) Temp Source: Oral (02/24 1619) BP: 108/76 (02/24 1619) Pulse Rate: 87 (02/24 1619)  Labs: Recent Labs    08/07/21 1416 08/07/21 1850 08/07/21 2208 08/08/21 0446 08/08/21 1727  HGB 12.1  --   --  12.0  --   HCT 37.8  --   --  37.3  --   PLT 124*  --   --  120*  --   APTT  --  33  --  92* 116*  LABPROT  --  19.2*  --   --   --   INR  --  1.6*  --   --   --   HEPARINUNFRC  --  >1.10*  --  >1.10*  --   CREATININE 6.00*  --   --  7.02*  --   TROPONINIHS 70* 65* 62*  --   --      Estimated Creatinine Clearance: 9 mL/min (A) (by C-G formula based on SCr of 7.02 mg/dL (H)).   Medical History: Past Medical History:  Diagnosis Date   Adult behavior problems    frontal lobe CVA   Anemia    chronic disease   Cardiomyopathy ([place])    CHF (congestive heart failure) (HCC)    Chicken pox    Coronary artery disease    GERD (gastroesophageal reflux disease)    Heart failure (HCC)    Hepatitis    history of hep c   History of bipolar disorder    Hyperlipidemia    Hypertension    Myocardial infarction [facility])    Obesity    Paroxysmal atrial fibrillation ([place])    Peripheral vascular disease ([place])    Renal failure    Renal insufficiency    Stroke [facility]) 2011   Superficial incisional surgical site infection 03/14/2014    Assessment: 61 y.o. female with history of CAD s/p CABG  in February 2020, atrial fibrillation on apixaban (last dose 02/23 1000), ESRD on hemodialysis on MWF has had dialysis yesterday has been  experiencing chest pain for the last 48 hours. She was admitted and treated with IV heparin in 2020 with her weight approximately the same as current measurement.   2/24 0446 aPTT 92, therapeutic HL >1.1 0224 1727 aPTT= 116  suprathera, dec drip from 950 to 800 u/hr   Goal of Therapy:  Heparin level 0.3-0.7 units/ml aPTT 66 - 102 seconds Monitor platelets by anticoagulation protocol: Yes     Plan:  Decrease heparin infusion to 800 units/hr Recheck aPTT level in 8 hours  We will use aPTT levels to guide infusion rates until aPTT and heparin levels correlate Continue to monitor H&H and platelets  Chinita Greenland PharmD Clinical Pharmacist 08/08/2021

## 2021-08-08 NOTE — Assessment & Plan Note (Signed)
Patient presented with chest pain for 48 hours that started after dialysis.  It seems reproducible on exam.  Gets worse with deep breathing.  Troponin trending down 70> 65>62 EKG no acute ischemic changes. Cardiology is consulted.  Echocardiogram completed report pending. No plan for any invasive cardiac procedure at this time

## 2021-08-08 NOTE — Hospital Course (Signed)
This 61 years old female with PMH significant of CAD s/p CABG , ESRD on hemodialysis on MWF, presented in the ED with atypical chest pain for 2 days.  Patient reports having regular dialysis and then she has developed intermittent left-sided chest pain. She also reports having productive cough and congestion for the same duration.  Patient also reports right upper quadrant abdominal pain, associated with nausea and vomiting. In the ED she is found to have a BNP 2600, troponin 62, COVID-negative. Nephrology and cardiology is consulted.  Patient is having HIDA scan for RUQ pain

## 2021-08-08 NOTE — Assessment & Plan Note (Signed)
Heart rate is well controlled.  Continue amiodarone. Hold Eliquis and continue heparin gtt in anticipation of possible procedure.  Restart Eliquis if no plan for any intervention.

## 2021-08-09 ENCOUNTER — Observation Stay
Admit: 2021-08-09 | Discharge: 2021-08-09 | Disposition: A | Discharge status: Home / Self Care | Payer: Medicare Other | Attending: Cardiology | Admitting: Cardiology

## 2021-08-09 DIAGNOSIS — E871 Hypo-osmolality and hyponatremia: Secondary | ICD-10-CM | POA: Diagnosis not present

## 2021-08-09 DIAGNOSIS — Z992 Dependence on renal dialysis: Secondary | ICD-10-CM

## 2021-08-09 DIAGNOSIS — Z8619 Personal history of other infectious and parasitic diseases: Secondary | ICD-10-CM

## 2021-08-09 DIAGNOSIS — R079 Chest pain, unspecified: Secondary | ICD-10-CM

## 2021-08-09 DIAGNOSIS — I5020 Unspecified systolic (congestive) heart failure: Secondary | ICD-10-CM | POA: Diagnosis not present

## 2021-08-09 DIAGNOSIS — E039 Hypothyroidism, unspecified: Secondary | ICD-10-CM | POA: Diagnosis not present

## 2021-08-09 DIAGNOSIS — D638 Anemia in other chronic diseases classified elsewhere: Secondary | ICD-10-CM | POA: Diagnosis not present

## 2021-08-09 DIAGNOSIS — N2581 Secondary hyperparathyroidism of renal origin: Secondary | ICD-10-CM | POA: Diagnosis not present

## 2021-08-09 DIAGNOSIS — N186 End stage renal disease: Secondary | ICD-10-CM

## 2021-08-09 DIAGNOSIS — Z951 Presence of aortocoronary bypass graft: Secondary | ICD-10-CM | POA: Diagnosis not present

## 2021-08-09 DIAGNOSIS — R0789 Other chest pain: Secondary | ICD-10-CM | POA: Diagnosis not present

## 2021-08-09 DIAGNOSIS — I480 Paroxysmal atrial fibrillation: Secondary | ICD-10-CM | POA: Diagnosis not present

## 2021-08-09 DIAGNOSIS — D631 Anemia in chronic kidney disease: Secondary | ICD-10-CM | POA: Diagnosis not present

## 2021-08-09 LAB — APTT
aPTT: 79 seconds — ABNORMAL HIGH (ref 24–36)
aPTT: 76 seconds — ABNORMAL HIGH (ref 24–36)

## 2021-08-09 LAB — PHOSPHORUS: Phosphorus: 5.9 mg/dL — ABNORMAL HIGH (ref 2.5–4.6)

## 2021-08-09 LAB — RESPIRATORY PANEL BY PCR

## 2021-08-09 LAB — CBC
HCT: 34.7 % — ABNORMAL LOW (ref 36.0–46.0)
Hemoglobin: 11.4 g/dL — ABNORMAL LOW (ref 12.0–15.0)
MCH: 33.3 pg (ref 26.0–34.0)
MCHC: 32.9 g/dL (ref 30.0–36.0)
MCV: 101.5 fL — ABNORMAL HIGH (ref 80.0–100.0)
Platelets: 113 10*3/uL — ABNORMAL LOW (ref 150–400)
RBC: 3.42 MIL/uL — ABNORMAL LOW (ref 3.87–5.11)
RDW: 14.4 % (ref 11.5–15.5)
WBC: 3.1 10*3/uL — ABNORMAL LOW (ref 4.0–10.5)
nRBC: 0 % (ref 0.0–0.2)

## 2021-08-09 LAB — BASIC METABOLIC PANEL
Anion gap: 15 (ref 5–15)
BUN: 36 mg/dL — ABNORMAL HIGH (ref 6–20)
CO2: 25 mmol/L (ref 22–32)
Calcium: 8.1 mg/dL — ABNORMAL LOW (ref 8.9–10.3)
Chloride: 94 mmol/L — ABNORMAL LOW (ref 98–111)
Creatinine, Ser: 6.15 mg/dL — ABNORMAL HIGH (ref 0.44–1.00)
GFR, Estimated: 7 mL/min — ABNORMAL LOW (ref >60)
Glucose, Bld: 149 mg/dL — ABNORMAL HIGH (ref 70–99)
Potassium: 4.1 mmol/L (ref 3.5–5.1)
Sodium: 134 mmol/L — ABNORMAL LOW (ref 135–145)

## 2021-08-09 LAB — HEPARIN LEVEL (UNFRACTIONATED)
Heparin Unfractionated: 0.57 IU/mL (ref 0.30–0.70)
Heparin Unfractionated: 0.45 IU/mL (ref 0.30–0.70)

## 2021-08-09 LAB — MAGNESIUM: Magnesium: 2 mg/dL (ref 1.7–2.4)

## 2021-08-09 LAB — HEPATITIS B SURFACE ANTIBODY, QUANTITATIVE: Hep B S AB Quant (Post): <3.1 m[IU]/mL — ABNORMAL LOW (ref >9.9)

## 2021-08-09 MED ORDER — GUAIFENESIN ER 600 MG PO TB12: 600.0000 mg | ORAL_TABLET | Freq: Two times a day (BID) | ORAL | Status: DC

## 2021-08-09 MED ORDER — OXYCODONE HCL 5 MG PO TABS
5.0000 mg | ORAL_TABLET | Freq: Four times a day (QID) | ORAL | Status: DC | PRN
  Administered 2021-08-09: 5 mg via ORAL
  Filled 2021-08-09: qty 1

## 2021-08-09 MED ORDER — LIDOCAINE 5 % EX PTCH
1.0000 | MEDICATED_PATCH | CUTANEOUS | Status: DC
  Administered 2021-08-09: 1 via TRANSDERMAL
  Filled 2021-08-09 (×2): qty 1

## 2021-08-09 MED ORDER — GUAIFENESIN ER 600 MG PO TB12
600.0000 mg | ORAL_TABLET | Freq: Two times a day (BID) | ORAL | Status: DC
  Administered 2021-08-09: 600 mg via ORAL
  Filled 2021-08-09: qty 1

## 2021-08-09 MED ORDER — GUAIFENESIN-DM 100-10 MG/5ML PO SYRP: 5.0000 mL | ORAL_SOLUTION | ORAL | 0 refills | Status: DC | PRN

## 2021-08-09 NOTE — Progress Notes (Signed)
[place] for heparin initiation and monitoring Indication: chest pain/ACS  Allergies  Allergen Reactions   Morphine And Related Hives   Levaquin [Levofloxacin] Itching    Severe itching; prickly sensation   Other    Latex Rash   Tape Rash    Patient Measurements: Height: 5\' 6"  (167.6 cm) Weight: 72.7 kg (160 lb 4.4 oz) IBW/kg (Calculated) : 59.3 Heparin Dosing Weight: 74.3 kg  Vital Signs: Temp: 98.8 F (37.1 C) (02/25 1202) Temp Source: Oral (02/25 0500) BP: 98/73 (02/25 1202) Pulse Rate: 90 (02/25 1202)  Labs: Recent Labs    08/07/21 1416 08/07/21 1850 08/07/21 1850 08/07/21 2208 08/08/21 0446 08/08/21 1727 08/09/21 0453 08/09/21 1258  HGB 12.1  --   --   --  12.0  --  11.4*  --   HCT 37.8  --   --   --  37.3  --  34.7*  --   PLT 124*  --   --   --  120*  --  113*  --   APTT  --  33   < >  --  92* 116* 79* 76*  LABPROT  --  19.2*  --   --   --   --   --   --   INR  --  1.6*  --   --   --   --   --   --   HEPARINUNFRC  --  >1.10*   < >  --  >1.10*  --  0.57 0.45  CREATININE 6.00*  --   --   --  7.02*  --  6.15*  --   TROPONINIHS 70* 65*  --  62*  --   --   --   --    < > = values in this interval not displayed.     Estimated Creatinine Clearance: 10.2 mL/min (A) (by C-G formula based on SCr of 6.15 mg/dL (H)).   Medical History: Past Medical History:  Diagnosis Date   Adult behavior problems    frontal lobe CVA   Anemia    chronic disease   Cardiomyopathy ([place])    CHF (congestive heart failure) (HCC)    Chicken pox    Coronary artery disease    GERD (gastroesophageal reflux disease)    Heart failure (HCC)    Hepatitis    history of hep c   History of bipolar disorder    Hyperlipidemia    Hypertension    Myocardial infarction [facility])    Obesity    Paroxysmal atrial fibrillation ([place])    Peripheral vascular disease ([place])    Renal failure    Renal insufficiency    Stroke [facility]) 2011   Superficial  incisional surgical site infection 03/14/2014    Assessment: 61 y.o. female with history of CAD s/p CABG  in February 2020, atrial fibrillation on apixaban (last dose 02/23 1000), ESRD on hemodialysis on MWF has had dialysis yesterday has been experiencing chest pain for the last 48 hours. She was admitted and treated with IV heparin in 2020 with her weight approximately the same as current measurement.   2/24 0446 aPTT 92, therapeutic HL >1.1 0224 1727 aPTT= 116  suprathera, dec drip from 950 to 800 u/hr 2/25 0453 aPTT 79, HL 0.57, therapeutic x 1 2/25 1258 aPTT 76, HL 0.45 thera x 2 and correlating   Goal of Therapy:  Heparin level 0.3-0.7 units/ml aPTT 66 - 102 seconds Monitor platelets by anticoagulation  protocol: Yes     Plan:  2/25 1258 aPTT 76, HL 0.45 therapeutic x 2  Continue heparin infusion at 800 units/hr aPTT and HL correlating F/u HL  with am labs We will use Heparin levels going forward to assess  Continue to monitor H&H and platelets  Chinita Greenland PharmD Clinical Pharmacist 08/09/2021

## 2021-08-09 NOTE — Progress Notes (Signed)
[facility]  ROUNDING NOTE   Subjective:   [name] is a 61 year old African-American female with past medical histories including CAD with CABG and end-stage renal disease on dialysis.  Patient presents to the emergency department with complaints of chest pain that has spent 2 days.  Patient has been admitted for RUQ pain [R10.11] Chest pain, moderate coronary artery risk [R07.9] Chest pain [R07.9]  Patient is known to our clinic and receives outpatient dialysis treatments at [facility] on MWF schedule, supervised by Dr. Candiss Norse.    Patient was seen today on second floor Patient's chief complaint in today visit was that she wants to go home   Objective:  Vital signs in last 24 hours:  Temp:  [97.5 F (36.4 C)-98.8 F (37.1 C)] 98.8 F (37.1 C) (02/25 1202) Pulse Rate:  [70-98] 90 (02/25 1202) Resp:  [13-21] 18 (02/25 1202) BP: (98-122)/(62-91) 98/73 (02/25 1202) SpO2:  [94 %-100 %] 99 % (02/25 1202) Weight:  [72 kg-72.7 kg] 72.7 kg (02/25 0500)  Weight change: -2.843 kg Filed Weights   08/07/21 1412 08/08/21 1902 08/09/21 0500  Weight: 74.8 kg 72 kg 72.7 kg    Intake/Output: I/O last 3 completed shifts: In: 650.4 [P.O.:360; I.V.:190.4; IV Piggyback:100] Out: 658 [Other:658]   Intake/Output this shift:  No intake/output data recorded.  Physical Exam: General: NAD, sitting up in bed, awake and alert  Head: Normocephalic, atraumatic. Moist oral mucosal membranes  Eyes: Anicteric  Lungs:  Clear to auscultation, normal effort  Heart: Regular rate and rhythm  Abdomen:  Soft, tender right upper quadrant,   Extremities: No peripheral edema.  Neurologic: Nonfocal, moving all four extremities  Skin: No lesions  Access: Left aVF    Basic Metabolic Panel: Recent Labs  Lab 08/07/21 1416 08/08/21 0446 08/09/21 0453  NA 138 137 134*  K 4.5 4.5 4.1  CL 95* 97* 94*  CO2 28 27 25   GLUCOSE 101* 81 149*  BUN 29* 36* 36*  CREATININE  6.00* 7.02* 6.15*  CALCIUM 8.2* 7.8* 8.1*  MG  --   --  2.0  PHOS  --   --  5.9*    Liver Function Tests: Recent Labs  Lab 08/07/21 1416 08/08/21 0446  AST 28 21  ALT 19 17  ALKPHOS 64 64  BILITOT 1.2 1.1  PROT 7.2 7.3  ALBUMIN 3.9 3.7   No results for input(s): LIPASE, AMYLASE in the last 168 hours. No results for input(s): AMMONIA in the last 168 hours.  CBC: Recent Labs  Lab 08/07/21 1416 08/08/21 0446 08/09/21 0453  WBC 4.4 3.8* 3.1*  NEUTROABS 3.1  --   --   HGB 12.1 12.0 11.4*  HCT 37.8 37.3 34.7*  MCV 104.1* 102.2* 101.5*  PLT 124* 120* 113*    Cardiac Enzymes: No results for input(s): CKTOTAL, CKMB, CKMBINDEX, TROPONINI in the last 168 hours.  BNP: Invalid input(s): POCBNP  CBG: No results for input(s): GLUCAP in the last 168 hours.  Microbiology: Results for orders placed or performed during the hospital encounter of 08/07/21  Resp Panel by RT-PCR (Flu A&B, Covid) Nasopharyngeal Swab     Status: None   Collection Time: 08/07/21  3:18 PM   Specimen: Nasopharyngeal Swab; Nasopharyngeal(NP) swabs in vial transport medium  Result Value Ref Range Status   SARS Coronavirus 2 by RT PCR NEGATIVE NEGATIVE Final    Comment: (NOTE) SARS-CoV-2 target nucleic acids are NOT DETECTED.  The SARS-CoV-2 RNA is generally detectable in upper respiratory specimens  during the acute phase of infection. The lowest concentration of SARS-CoV-2 viral copies this assay can detect is 138 copies/mL. A negative result does not preclude SARS-Cov-2 infection and should not be used as the sole basis for treatment or other patient management decisions. A negative result may occur with  improper specimen collection/handling, submission of specimen other than nasopharyngeal swab, presence of viral mutation(s) within the areas targeted by this assay, and inadequate number of viral copies(<138 copies/mL). A negative result must be combined with clinical observations, patient  history, and epidemiological information. The expected result is Negative.  Fact Sheet for Patients:  EntrepreneurPulse.com.au  Fact Sheet for Healthcare Providers:  IncredibleEmployment.be  This test is no t yet approved or cleared by the [place] FDA and  has been authorized for detection and/or diagnosis of SARS-CoV-2 by FDA under an Emergency Use Authorization (EUA). This EUA will remain  in effect (meaning this test can be used) for the duration of the COVID-19 declaration under Section 564(b)(1) of the Act, 21 U.S.C.section 360bbb-3(b)(1), unless the authorization is terminated  or revoked sooner.       Influenza A by PCR NEGATIVE NEGATIVE Final   Influenza B by PCR NEGATIVE NEGATIVE Final    Comment: (NOTE) The Xpert Xpress SARS-CoV-2/FLU/RSV plus assay is intended as an aid in the diagnosis of influenza from Nasopharyngeal swab specimens and should not be used as a sole basis for treatment. Nasal washings and aspirates are unacceptable for Xpert Xpress SARS-CoV-2/FLU/RSV testing.  Fact Sheet for Patients: EntrepreneurPulse.com.au  Fact Sheet for Healthcare Providers: IncredibleEmployment.be  This test is not yet approved or cleared by the [place] FDA and has been authorized for detection and/or diagnosis of SARS-CoV-2 by FDA under an Emergency Use Authorization (EUA). This EUA will remain in effect (meaning this test can be used) for the duration of the COVID-19 declaration under Section 564(b)(1) of the Act, 21 U.S.C. section 360bbb-3(b)(1), unless the authorization is terminated or revoked.  Performed at [facility], [address] [place], [place] [address]     Coagulation Studies: Recent Labs    08/07/21 1850  LABPROT 19.2*  INR 1.6*    Urinalysis: No results for input(s): COLORURINE, LABSPEC, PHURINE, GLUCOSEU, HGBUR, BILIRUBINUR, KETONESUR, PROTEINUR,  UROBILINOGEN, NITRITE, LEUKOCYTESUR in the last 72 hours.  Invalid input(s): APPERANCEUR    Imaging: DG Chest 2 View  Result Date: 08/07/2021 CLINICAL DATA:  Chest pain EXAM: CHEST - 2 VIEW COMPARISON:  Radiograph 03/11/2021 FINDINGS: Cardiomegaly. Prior median sternotomy CABG. There is no focal airspace consolidation. Mild central pulmonary vascular congestion. There is no large pleural effusion. There is no visible pneumothorax. No acute osseous abnormality. IMPRESSION: Cardiomegaly with central pulmonary vascular congestion. Electronically Signed   By: Maurine Simmering M.D.   On: 08/07/2021 15:16   NM Hepato W/EF  Result Date: 08/08/2021 CLINICAL DATA:  Right upper quadrant pain and nausea and vomiting for 3 days. Cholelithiasis. EXAM: NUCLEAR MEDICINE HEPATOBILIARY IMAGING WITH GALLBLADDER EF TECHNIQUE: Sequential images of the abdomen were obtained out to 60 minutes following intravenous administration of radiopharmaceutical. After oral ingestion of Ensure, gallbladder ejection fraction was determined. At 60 min, normal ejection fraction is greater than 33%. RADIOPHARMACEUTICALS:  5.0 mCi Tc-54m  Choletec IV COMPARISON:  None. FINDINGS: Prompt uptake and biliary excretion of activity by the liver is seen. Gallbladder activity is visualized, consistent with patency of cystic duct. Biliary activity passes into small bowel, consistent with patent common bile duct. Calculated gallbladder ejection fraction is 64%. (Normal gallbladder ejection  fraction with Ensure is greater than 33%.) IMPRESSION: Normal hepatobiliary scan, demonstrating patency of both the cystic and common bile ducts. Normal gallbladder ejection fraction. Electronically Signed   By: Marlaine Hind M.D.   On: 08/08/2021 16:19   US Abdomen Limited RUQ (LIVER/GB)  Result Date: 08/07/2021 CLINICAL DATA:  Right upper quadrant abdominal pain for 2 days. EXAM: ULTRASOUND ABDOMEN LIMITED RIGHT UPPER QUADRANT COMPARISON:  CT scan 02/09/2021  FINDINGS: Gallbladder: Single 7 mm mobile gallstone in the gallbladder. Moderate gallbladder wall thickening measuring a maximum of 4.7 mm. Small amount of pericholecystic fluid also noted. No sonographic Murphy sign elicited. Common bile duct: Diameter: 5.4 mm Liver: No hepatic lesions or intrahepatic biliary dilatation. Perihepatic fluid is noted. Portal vein is patent on color Doppler imaging with normal direction of blood flow towards the liver. Other: Echogenic right kidney consistent with known renal failure. IMPRESSION: 1. Single multiple echogenic gallstone in the gallbladder. 2. Gallbladder wall thickening pericholecystic fluid/ascites. 3. Normal caliber common bile duct and no intrahepatic biliary dilatation. Electronically Signed   By: Marijo Sanes M.D.   On: 08/07/2021 16:51     Medications:    cefTRIAXone (ROCEPHIN)  IV 2 g (08/09/21 0929)   heparin 800 Units/hr (08/08/21 1936)    amiodarone  200 mg Oral Daily   aspirin EC  81 mg Oral Daily   carvedilol  3.125 mg Oral BID   Chlorhexidine Gluconate Cloth  6 each Topical Q0600   guaiFENesin  600 mg Oral BID   levothyroxine  75 mcg Oral QAC breakfast   lidocaine  1 patch Transdermal Q24H   rosuvastatin  20 mg Oral QHS   acetaminophen **OR** acetaminophen, guaiFENesin-dextromethorphan, oxyCODONE, traMADol  Assessment/ Plan:  [name] is a 61 y.o.  female  [name] is a 61 year old African-American female with past medical histories including CAD with CABG and end-stage renal disease on dialysis.  Patient presents to the emergency department with complaints of chest pain that has spent 2 days.  Patient has been admitted for RUQ pain [R10.11] Chest pain, moderate coronary artery risk [R07.9] Chest pain [R07.9]   Anemia with chronic kidney disease.  Macrocytic Mircera received outpatient.  Hemoglobin currently 12.0.  Within acceptable range.  We will continue to monitor  2.  End-stage renal disease on  hemodialysis.  Patient did receive dialysis yesterday but patient signed out early We will dialyze patient again today  3.  Secondary hyperparathyroidism.  Calcium currently 7.8, below target but will continue to monitor.  Currently prescribed Renvela outpatient with meals.  4. Chest pain Primary team is following Cardiology has been consulted   5.Hyponatremia Secondary to ESRD We will follow   0 [name] s Theador Hawthorne 2/25/20231:34 PM

## 2021-08-09 NOTE — Progress Notes (Signed)
Cross [place] Patient  with ongoing chest pain from ocughing. EKG with significant STE or changes. WBC count now low Afebrile -lidocaine patch for chest wall pain Resp panel r/o viral etiology for pain and cough,  although most likely her symptoms are related to her pulmonary vascular congestion.

## 2021-08-09 NOTE — Progress Notes (Signed)
[place] for heparin initiation and monitoring Indication: chest pain/ACS  Allergies  Allergen Reactions   Morphine And Related Hives   Levaquin [Levofloxacin] Itching    Severe itching; prickly sensation   Other    Latex Rash   Tape Rash    Patient Measurements: Height: 5\' 6"  (167.6 cm) Weight: 72 kg (158 lb 11.7 oz) IBW/kg (Calculated) : 59.3 Heparin Dosing Weight: 74.3 kg  Vital Signs: Temp: 98.7 F (37.1 C) (02/25 0015) Temp Source: Oral (02/25 0015) BP: 103/72 (02/25 0015) Pulse Rate: 89 (02/25 0015)  Labs: Recent Labs    08/07/21 1416 08/07/21 1850 08/07/21 2208 08/08/21 0446 08/08/21 1727 08/09/21 0453  HGB 12.1  --   --  12.0  --  11.4*  HCT 37.8  --   --  37.3  --  34.7*  PLT 124*  --   --  120*  --  113*  APTT  --  33  --  92* 116*  --   LABPROT  --  19.2*  --   --   --   --   INR  --  1.6*  --   --   --   --   HEPARINUNFRC  --  >1.10*  --  >1.10*  --  0.57  CREATININE 6.00*  --   --  7.02*  --  6.15*  TROPONINIHS 70* 65* 62*  --   --   --      Estimated Creatinine Clearance: 10.1 mL/min (A) (by C-G formula based on SCr of 6.15 mg/dL (H)).   Medical History: Past Medical History:  Diagnosis Date   Adult behavior problems    frontal lobe CVA   Anemia    chronic disease   Cardiomyopathy ([place])    CHF (congestive heart failure) (HCC)    Chicken pox    Coronary artery disease    GERD (gastroesophageal reflux disease)    Heart failure (HCC)    Hepatitis    history of hep c   History of bipolar disorder    Hyperlipidemia    Hypertension    Myocardial infarction [facility])    Obesity    Paroxysmal atrial fibrillation ([place])    Peripheral vascular disease ([place])    Renal failure    Renal insufficiency    Stroke [facility]) 2011   Superficial incisional surgical site infection 03/14/2014    Assessment: 61 y.o. female with history of CAD s/p CABG  in February 2020, atrial fibrillation on apixaban (last dose 02/23  1000), ESRD on hemodialysis on MWF has had dialysis yesterday has been experiencing chest pain for the last 48 hours. She was admitted and treated with IV heparin in 2020 with her weight approximately the same as current measurement.   2/24 0446 aPTT 92, therapeutic HL >1.1 0224 1727 aPTT= 116  suprathera, dec drip from 950 to 800 u/hr 2/25 0453 aPTT 79, HL 0.57, therapeutic x 1   Goal of Therapy:  Heparin level 0.3-0.7 units/ml aPTT 66 - 102 seconds Monitor platelets by anticoagulation protocol: Yes     Plan:  Continue heparin infusion at 800 units/hr aPTT and HL starting to correlate Recheck aPTT & HL level in 8 hours to confirm We will use aPTT levels to guide infusion rates until aPTT and heparin levels correlate Continue to monitor H&H and platelets  Renda Rolls, PharmD, [facility] 08/09/2021 6:43 AM

## 2021-08-09 NOTE — Progress Notes (Signed)
[facility] Cardiology    SUBJECTIVE: Patient states improved pain still has mild shortness of breath is on chronic O2 no swelling compliant with dialysis therapy no fever chills or sweats feels reasonably well today   Vitals:   08/08/21 2236 08/09/21 0015 08/09/21 0500 08/09/21 0747  BP: 121/90 103/72 99/67 100/78  Pulse: 98 89 90 94  Resp: 16 16 20 18   Temp: 98.8 F (37.1 C) 98.7 F (37.1 C) 97.9 F (36.6 C) 97.8 F (36.6 C)  TempSrc: Oral Oral Oral   SpO2: 94% 98% 96% 97%  Weight:   72.7 kg   Height:         Intake/Output Summary (Last 24 hours) at 08/09/2021 1159 Last data filed at 08/08/2021 2100 Gross per 24 hour  Intake 482.78 ml  Output 658 ml  Net -175.22 ml      PHYSICAL EXAM  General: Well developed, well nourished, in no acute distress HEENT:  Normocephalic and atramatic Neck:  No JVD.  Lungs: Clear bilaterally to auscultation and percussion. Heart: HRRR . Normal S1 and S2 without gallops or murmurs.  Abdomen: Bowel sounds are positive, abdomen soft and non-tender  Msk:  Back normal, normal gait. Normal strength and tone for age. Extremities: No clubbing, cyanosis or edema.   Neuro: Alert and oriented X 3. Psych:  Good affect, responds appropriately   LABS: Basic Metabolic Panel: Recent Labs    08/08/21 0446 08/09/21 0453  NA 137 134*  K 4.5 4.1  CL 97* 94*  CO2 27 25  GLUCOSE 81 149*  BUN 36* 36*  CREATININE 7.02* 6.15*  CALCIUM 7.8* 8.1*  MG  --  2.0  PHOS  --  5.9*   Liver Function Tests: Recent Labs    08/07/21 1416 08/08/21 0446  AST 28 21  ALT 19 17  ALKPHOS 64 64  BILITOT 1.2 1.1  PROT 7.2 7.3  ALBUMIN 3.9 3.7   No results for input(s): LIPASE, AMYLASE in the last 72 hours. CBC: Recent Labs    08/07/21 1416 08/08/21 0446 08/09/21 0453  WBC 4.4 3.8* 3.1*  NEUTROABS 3.1  --   --   HGB 12.1 12.0 11.4*  HCT 37.8 37.3 34.7*  MCV 104.1* 102.2* 101.5*  PLT 124* 120* 113*   Cardiac Enzymes: No results for input(s): CKTOTAL,  CKMB, CKMBINDEX, TROPONINI in the last 72 hours. BNP: Invalid input(s): POCBNP D-Dimer: No results for input(s): DDIMER in the last 72 hours. Hemoglobin A1C: No results for input(s): HGBA1C in the last 72 hours. Fasting Lipid Panel: No results for input(s): CHOL, HDL, LDLCALC, TRIG, CHOLHDL, LDLDIRECT in the last 72 hours. Thyroid Function Tests: No results for input(s): TSH, T4TOTAL, T3FREE, THYROIDAB in the last 72 hours.  Invalid input(s): FREET3 Anemia Panel: No results for input(s): VITAMINB12, FOLATE, FERRITIN, TIBC, IRON, RETICCTPCT in the last 72 hours.  DG Chest 2 View  Result Date: 08/07/2021 CLINICAL DATA:  Chest pain EXAM: CHEST - 2 VIEW COMPARISON:  Radiograph 03/11/2021 FINDINGS: Cardiomegaly. Prior median sternotomy CABG. There is no focal airspace consolidation. Mild central pulmonary vascular congestion. There is no large pleural effusion. There is no visible pneumothorax. No acute osseous abnormality. IMPRESSION: Cardiomegaly with central pulmonary vascular congestion. Electronically Signed   By: Maurine Simmering M.D.   On: 08/07/2021 15:16   NM Hepato W/EF  Result Date: 08/08/2021 CLINICAL DATA:  Right upper quadrant pain and nausea and vomiting for 3 days. Cholelithiasis. EXAM: NUCLEAR MEDICINE HEPATOBILIARY IMAGING WITH GALLBLADDER EF TECHNIQUE: Sequential images of the  abdomen were obtained out to 60 minutes following intravenous administration of radiopharmaceutical. After oral ingestion of Ensure, gallbladder ejection fraction was determined. At 60 min, normal ejection fraction is greater than 33%. RADIOPHARMACEUTICALS:  5.0 mCi Tc-58m  Choletec IV COMPARISON:  None. FINDINGS: Prompt uptake and biliary excretion of activity by the liver is seen. Gallbladder activity is visualized, consistent with patency of cystic duct. Biliary activity passes into small bowel, consistent with patent common bile duct. Calculated gallbladder ejection fraction is 64%. (Normal gallbladder  ejection fraction with Ensure is greater than 33%.) IMPRESSION: Normal hepatobiliary scan, demonstrating patency of both the cystic and common bile ducts. Normal gallbladder ejection fraction. Electronically Signed   By: Marlaine Hind M.D.   On: 08/08/2021 16:19   US Abdomen Limited RUQ (LIVER/GB)  Result Date: 08/07/2021 CLINICAL DATA:  Right upper quadrant abdominal pain for 2 days. EXAM: ULTRASOUND ABDOMEN LIMITED RIGHT UPPER QUADRANT COMPARISON:  CT scan 02/09/2021 FINDINGS: Gallbladder: Single 7 mm mobile gallstone in the gallbladder. Moderate gallbladder wall thickening measuring a maximum of 4.7 mm. Small amount of pericholecystic fluid also noted. No sonographic Murphy sign elicited. Common bile duct: Diameter: 5.4 mm Liver: No hepatic lesions or intrahepatic biliary dilatation. Perihepatic fluid is noted. Portal vein is patent on color Doppler imaging with normal direction of blood flow towards the liver. Other: Echogenic right kidney consistent with known renal failure. IMPRESSION: 1. Single multiple echogenic gallstone in the gallbladder. 2. Gallbladder wall thickening pericholecystic fluid/ascites. 3. Normal caliber common bile duct and no intrahepatic biliary dilatation. Electronically Signed   By: Marijo Sanes M.D.   On: 08/07/2021 16:51     Echo severely depressed left ventricular function globally 25 to 30%  TELEMETRY: Sinus rhythm right bundle branch block nonspecific ST-T changes:  ASSESSMENT AND PLAN:  Principal Problem:   Chest pain Active Problems:   ESRD on hemodialysis (HCC)   S/P CABG x 3   History of hepatitis C   Anemia of chronic disease   Paroxysmal atrial fibrillation (HCC)   Hypothyroidism   RUQ pain   Thrombocytopenia ([place])    Plan Continue current medical therapy conservative telemetry Agree with nephrology for end-stage renal disease with dialysis therapy Atrial fibrillation chronic stable continue anticoagulation as necessary Atypical chest pain do not  recommend cardiac cath consider outpatient work-up Correct electrolytes especially hyponatremia Have the patient follow-up with primary cardiologist Dr. Humphrey Rolls as an outpatient    Yolonda Kida, MD 08/09/2021 11:59 AM

## 2021-08-09 NOTE — Discharge Summary (Signed)
Physician Discharge Summary   Patient: [name] MRN: [id number] DOB: 10-31-1960  Admit date:     08/07/2021  Discharge date: 08/09/21  Discharge Physician: Edwin Dada   PCP: [name], MD   Recommendations at discharge:  Follow up with PCP in 1 week     Discharge Diagnoses: Principal Problem:   Acute upper respiratory infection Active Problems:   ESRD on hemodialysis (HCC)   S/P CABG x 3   History of hepatitis C   Anemia of chronic disease   Paroxysmal atrial fibrillation (HCC)   Hypothyroidism   Thrombocytopenia due to cirrhosis       Hospital Course: [name] is a 61 y.o. F with ESRD, CAD s/p CABG, who presented in the ED with atypical chest pain for 2 days as well as malaise, cough.  Patient also reported right upper quadrant abdominal pain, associated with nausea and vomiting.   In the ED she was found to have a BNP 2600, troponin 62, COVID-negative.  CXR ruled out pneumonia.  Nephrology and cardiology consulted.   Chest pain was reproducible and worse after coughing.  Cardiology felt her chest pain and troponins were likely from her URI/bronchitis, not ischemia.  Nephrology evaluated the patient, felt the BNP was likely due to chronic disease, and felt she was not acutely fluid overloaded.    She did undergo a HIDA scan to rule out gallbladder disease, this was normal and like her chest pain, thought to be worse with couhing, likely from bronchitis.  Discharged with anti-tussives, supportive care for bronchitis.         Pain control - [employer] Controlled Substance Reporting System database was reviewed.   Consultants: Nephrology Dr. Theador Hawthorne, Cardiology Dr. Clayborn Bigness Procedures performed: Echo  Disposition: Home  DISCHARGE MEDICATION: Allergies as of 08/09/2021       Reactions   Morphine And Related Hives   Levaquin [levofloxacin] Itching   Severe itching; prickly sensation   Other    Latex Rash   Tape Rash         Medication List     TAKE these medications    amiodarone 200 MG tablet Commonly known as: PACERONE Take 200 mg by mouth daily.   carvedilol 6.25 MG tablet Commonly known as: COREG Take 6.25 mg by mouth 2 (two) times daily.   cinacalcet 90 MG tablet Commonly known as: SENSIPAR Take 90 mg by mouth daily.   dapagliflozin propanediol 10 MG Tabs tablet Commonly known as: FARXIGA Take 10 mg by mouth daily.   Eliquis 5 MG Tabs tablet Generic drug: apixaban Take 5 mg by mouth 2 (two) times daily.   guaiFENesin 600 MG 12 hr tablet Commonly known as: MUCINEX Take 1 tablet (600 mg total) by mouth 2 (two) times daily.   guaiFENesin-dextromethorphan 100-10 MG/5ML syrup Commonly known as: ROBITUSSIN DM Take 5 mLs by mouth every 4 (four) hours as needed for cough.   hydrOXYzine 50 MG tablet Commonly known as: ATARAX Take 50 mg by mouth at bedtime.   levothyroxine 75 MCG tablet Commonly known as: SYNTHROID Take 75 mcg by mouth daily before breakfast.   lidocaine-prilocaine cream Commonly known as: EMLA Apply 1 application topically daily as needed (port access).   losartan 25 MG tablet Commonly known as: COZAAR Take 12.5 mg by mouth daily.   midodrine 10 MG tablet Commonly known as: PROAMATINE Take 10 mg by mouth as directed.   oxyCODONE-acetaminophen 5-325 MG tablet Commonly known as: PERCOCET/ROXICET Take 1 tablet by  mouth 2 (two) times daily as needed for moderate pain.   pantoprazole 40 MG tablet Commonly known as: PROTONIX Take 40 mg by mouth daily.   ranolazine 1000 MG SR tablet Commonly known as: RANEXA Take 1 tablet (1,000 mg total) by mouth 2 (two) times daily. Increased from 500 mg 2 times daily. What changed: additional instructions   sevelamer carbonate 800 MG tablet Commonly known as: RENVELA Take 3,200 mg by mouth 3 (three) times daily with meals.        Follow-up Information     [name], MD. Schedule an appointment as soon as possible  for a visit in 1 week(s).   Specialty: Internal Medicine Contact information: [place] [address] [phone]                Discharge Instructions     Discharge instructions   Complete by: As directed    You were admitted to the hospital for cough and chest pain. Here, we found that this was likely from a bronchitis. Bronchitis causes a lot of coughing and often chest pain and rib pain from coughing. The treatment is to rest, take your home medicines, adhere to your dialysis schedule and use the flutter valve and incentive spirometer.  For the cough, I generally take robitussin to suppress the cough and also Mucinex to break it up. Take Robitussin DM (with dextromethorphan and guiafenesin) to suppress cough. For the breaking up, take Mucinex twice daily  If you have oxycodone at home, you may take some of that, this will help with rib pain but also with cough. Take no more than twice daily  If you do not have this, acetaminophen and a hot pad will also help with the rib pain   Increase activity slowly   Complete by: As directed        Discharge Exam: Filed Weights   08/08/21 1902 08/09/21 0500 08/09/21 1430  Weight: 72 kg 72.7 kg 73.7 kg   General: Pt is alert, awake, not in acute distress, sitting up in bed, coughing frequently Cardiovascular: RRR, nl S1-S2, no murmurs appreciated.   No LE edema.   Respiratory: Normal respiratory rate and rhythm.  CTAB without rales or wheezes. Abdominal: Abdomen soft.  Her only tenderness is with palpation over the right lower edge of the rib cage.  No distension or HSM.   Neuro/Psych: Strength symmetric in upper and lower extremities.  Judgment and insight appear normal.   Condition at discharge: good  The results of significant diagnostics from this hospitalization (including imaging, microbiology, ancillary and laboratory) are listed below for reference.   Imaging Studies: DG Chest 2 View  Result Date:  08/07/2021 CLINICAL DATA:  Chest pain EXAM: CHEST - 2 VIEW COMPARISON:  Radiograph 03/11/2021 FINDINGS: Cardiomegaly. Prior median sternotomy CABG. There is no focal airspace consolidation. Mild central pulmonary vascular congestion. There is no large pleural effusion. There is no visible pneumothorax. No acute osseous abnormality. IMPRESSION: Cardiomegaly with central pulmonary vascular congestion. Electronically Signed   By: Maurine Simmering M.D.   On: 08/07/2021 15:16   NM Hepato W/EF  Result Date: 08/08/2021 CLINICAL DATA:  Right upper quadrant pain and nausea and vomiting for 3 days. Cholelithiasis. EXAM: NUCLEAR MEDICINE HEPATOBILIARY IMAGING WITH GALLBLADDER EF TECHNIQUE: Sequential images of the abdomen were obtained out to 60 minutes following intravenous administration of radiopharmaceutical. After oral ingestion of Ensure, gallbladder ejection fraction was determined. At 60 min, normal ejection fraction is greater than 33%. RADIOPHARMACEUTICALS:  5.0 mCi Tc-92m  Choletec IV COMPARISON:  None. FINDINGS: Prompt uptake and biliary excretion of activity by the liver is seen. Gallbladder activity is visualized, consistent with patency of cystic duct. Biliary activity passes into small bowel, consistent with patent common bile duct. Calculated gallbladder ejection fraction is 64%. (Normal gallbladder ejection fraction with Ensure is greater than 33%.) IMPRESSION: Normal hepatobiliary scan, demonstrating patency of both the cystic and common bile ducts. Normal gallbladder ejection fraction. Electronically Signed   By: Marlaine Hind M.D.   On: 08/08/2021 16:19   US Abdomen Limited RUQ (LIVER/GB)  Result Date: 08/07/2021 CLINICAL DATA:  Right upper quadrant abdominal pain for 2 days. EXAM: ULTRASOUND ABDOMEN LIMITED RIGHT UPPER QUADRANT COMPARISON:  CT scan 02/09/2021 FINDINGS: Gallbladder: Single 7 mm mobile gallstone in the gallbladder. Moderate gallbladder wall thickening measuring a maximum of 4.7 mm. Small  amount of pericholecystic fluid also noted. No sonographic Murphy sign elicited. Common bile duct: Diameter: 5.4 mm Liver: No hepatic lesions or intrahepatic biliary dilatation. Perihepatic fluid is noted. Portal vein is patent on color Doppler imaging with normal direction of blood flow towards the liver. Other: Echogenic right kidney consistent with known renal failure. IMPRESSION: 1. Single multiple echogenic gallstone in the gallbladder. 2. Gallbladder wall thickening pericholecystic fluid/ascites. 3. Normal caliber common bile duct and no intrahepatic biliary dilatation. Electronically Signed   By: Marijo Sanes M.D.   On: 08/07/2021 16:51    Microbiology: Results for orders placed or performed during the hospital encounter of 08/07/21  Resp Panel by RT-PCR (Flu A&B, Covid) Nasopharyngeal Swab     Status: None   Collection Time: 08/07/21  3:18 PM   Specimen: Nasopharyngeal Swab; Nasopharyngeal(NP) swabs in vial transport medium  Result Value Ref Range Status   SARS Coronavirus 2 by RT PCR NEGATIVE NEGATIVE Final    Comment: (NOTE) SARS-CoV-2 target nucleic acids are NOT DETECTED.  The SARS-CoV-2 RNA is generally detectable in upper respiratory specimens during the acute phase of infection. The lowest concentration of SARS-CoV-2 viral copies this assay can detect is 138 copies/mL. A negative result does not preclude SARS-Cov-2 infection and should not be used as the sole basis for treatment or other patient management decisions. A negative result may occur with  improper specimen collection/handling, submission of specimen other than nasopharyngeal swab, presence of viral mutation(s) within the areas targeted by this assay, and inadequate number of viral copies(<138 copies/mL). A negative result must be combined with clinical observations, patient history, and epidemiological information. The expected result is Negative.  Fact Sheet for Patients:   EntrepreneurPulse.com.au  Fact Sheet for Healthcare Providers:  IncredibleEmployment.be  This test is no t yet approved or cleared by the [place] FDA and  has been authorized for detection and/or diagnosis of SARS-CoV-2 by FDA under an Emergency Use Authorization (EUA). This EUA will remain  in effect (meaning this test can be used) for the duration of the COVID-19 declaration under Section 564(b)(1) of the Act, 21 U.S.C.section 360bbb-3(b)(1), unless the authorization is terminated  or revoked sooner.       Influenza A by PCR NEGATIVE NEGATIVE Final   Influenza B by PCR NEGATIVE NEGATIVE Final    Comment: (NOTE) The Xpert Xpress SARS-CoV-2/FLU/RSV plus assay is intended as an aid in the diagnosis of influenza from Nasopharyngeal swab specimens and should not be used as a sole basis for treatment. Nasal washings and aspirates are unacceptable for Xpert Xpress SARS-CoV-2/FLU/RSV testing.  Fact Sheet for Patients: EntrepreneurPulse.com.au  Fact Sheet for Healthcare  Providers: IncredibleEmployment.be  This test is not yet approved or cleared by the [place] and has been authorized for detection and/or diagnosis of SARS-CoV-2 by FDA under an Emergency Use Authorization (EUA). This EUA will remain in effect (meaning this test can be used) for the duration of the COVID-19 declaration under Section 564(b)(1) of the Act, 21 U.S.C. section 360bbb-3(b)(1), unless the authorization is terminated or revoked.  Performed at [facility], [place], [place] [address]   Respiratory (~20 pathogens) panel by PCR     Status: Abnormal   Collection Time: 08/09/21  6:50 AM   Specimen: Nasopharyngeal Swab; Respiratory  Result Value Ref Range Status   Adenovirus NOT DETECTED NOT DETECTED Final   Coronavirus 229E NOT DETECTED NOT DETECTED Final    Comment: (NOTE) The Coronavirus on  the Respiratory Panel, DOES NOT test for the novel  Coronavirus (2019 nCoV)    Coronavirus HKU1 NOT DETECTED NOT DETECTED Final   Coronavirus NL63 NOT DETECTED NOT DETECTED Final   Coronavirus OC43 NOT DETECTED NOT DETECTED Final   Metapneumovirus NOT DETECTED NOT DETECTED Final   Rhinovirus / Enterovirus DETECTED (A) NOT DETECTED Final   Influenza A NOT DETECTED NOT DETECTED Final   Influenza B NOT DETECTED NOT DETECTED Final   Parainfluenza Virus 1 NOT DETECTED NOT DETECTED Final   Parainfluenza Virus 2 NOT DETECTED NOT DETECTED Final   Parainfluenza Virus 3 NOT DETECTED NOT DETECTED Final   Parainfluenza Virus 4 NOT DETECTED NOT DETECTED Final   Respiratory Syncytial Virus NOT DETECTED NOT DETECTED Final   Bordetella pertussis NOT DETECTED NOT DETECTED Final   Bordetella Parapertussis NOT DETECTED NOT DETECTED Final   Chlamydophila pneumoniae NOT DETECTED NOT DETECTED Final   Mycoplasma pneumoniae NOT DETECTED NOT DETECTED Final    Comment: Performed at [facility], [place]. [address], [place], [place] [address]    Labs: CBC: Recent Labs  Lab 08/07/21 1416 08/08/21 0446 08/09/21 0453  WBC 4.4 3.8* 3.1*  NEUTROABS 3.1  --   --   HGB 12.1 12.0 11.4*  HCT 37.8 37.3 34.7*  MCV 104.1* 102.2* 101.5*  PLT 124* 120* 035*   Basic Metabolic Panel: Recent Labs  Lab 08/07/21 1416 08/08/21 0446 08/09/21 0453  NA 138 137 134*  K 4.5 4.5 4.1  CL 95* 97* 94*  CO2 28 27 25   GLUCOSE 101* 81 149*  BUN 29* 36* 36*  CREATININE 6.00* 7.02* 6.15*  CALCIUM 8.2* 7.8* 8.1*  MG  --   --  2.0  PHOS  --   --  5.9*   Liver Function Tests: Recent Labs  Lab 08/07/21 1416 08/08/21 0446  AST 28 21  ALT 19 17  ALKPHOS 64 64  BILITOT 1.2 1.1  PROT 7.2 7.3  ALBUMIN 3.9 3.7   CBG: No results for input(s): GLUCAP in the last 168 hours.  Discharge time spent: 45 minutes.  Signed: Edwin Dada, MD [facility] 08/09/2021

## 2021-08-09 NOTE — Progress Notes (Signed)
Patient scheduled for shorten treatment, non-dialysis day, anticipating discharge after treatment. HD nurse cannulates LUA AVF without difficulty. Treatment initiated and patient noted blood pouring from lines; treatment stopped, lines inspected. During this process the patient's arm was infiltrated. On call nephrologist notified. Patient declines any further treatment, but will report to her outpatient clinic on Monday.

## 2021-08-09 NOTE — Progress Notes (Addendum)
Received call from [facility] in [place] re: possible ST elevation on monitor. Assessed patient: No change in symptoms; known chest wall pain unchanged from prior (worse on coughing, reproducible on palpation).   Tele tech suggested possibly lead malfunction causing appearance of STE. Replaced leadset and [name] from [facility] confirmed STE no longer appearing.   Communicated as FYI message to on call NP Hassan Rowan. Remains on telemetry.   Addendum 66: New tele call for STE. Pt condition appears unchanged. Obtained EKG, appears without STE. FYI messaged provider with update.

## 2021-08-11 DIAGNOSIS — Z992 Dependence on renal dialysis: Secondary | ICD-10-CM | POA: Diagnosis not present

## 2021-08-11 DIAGNOSIS — E8770 Fluid overload, unspecified: Secondary | ICD-10-CM | POA: Diagnosis not present

## 2021-08-11 DIAGNOSIS — N186 End stage renal disease: Secondary | ICD-10-CM | POA: Diagnosis not present

## 2021-08-12 DIAGNOSIS — N186 End stage renal disease: Secondary | ICD-10-CM | POA: Diagnosis not present

## 2021-08-12 DIAGNOSIS — Z992 Dependence on renal dialysis: Secondary | ICD-10-CM | POA: Diagnosis not present

## 2021-08-13 DIAGNOSIS — Z992 Dependence on renal dialysis: Secondary | ICD-10-CM | POA: Diagnosis not present

## 2021-08-13 DIAGNOSIS — N186 End stage renal disease: Secondary | ICD-10-CM | POA: Diagnosis not present

## 2021-08-15 DIAGNOSIS — Z992 Dependence on renal dialysis: Secondary | ICD-10-CM | POA: Diagnosis not present

## 2021-08-15 DIAGNOSIS — N186 End stage renal disease: Secondary | ICD-10-CM | POA: Diagnosis not present

## 2021-08-18 DIAGNOSIS — Z992 Dependence on renal dialysis: Secondary | ICD-10-CM | POA: Diagnosis not present

## 2021-08-18 DIAGNOSIS — N186 End stage renal disease: Secondary | ICD-10-CM | POA: Diagnosis not present

## 2021-08-19 DIAGNOSIS — F17200 Nicotine dependence, unspecified, uncomplicated: Secondary | ICD-10-CM | POA: Diagnosis not present

## 2021-08-19 DIAGNOSIS — I10 Essential (primary) hypertension: Secondary | ICD-10-CM | POA: Diagnosis not present

## 2021-08-19 DIAGNOSIS — I25810 Atherosclerosis of coronary artery bypass graft(s) without angina pectoris: Secondary | ICD-10-CM | POA: Diagnosis not present

## 2021-08-19 DIAGNOSIS — I6389 Other cerebral infarction: Secondary | ICD-10-CM | POA: Diagnosis not present

## 2021-08-19 DIAGNOSIS — I340 Nonrheumatic mitral (valve) insufficiency: Secondary | ICD-10-CM | POA: Diagnosis not present

## 2021-08-19 DIAGNOSIS — E782 Mixed hyperlipidemia: Secondary | ICD-10-CM | POA: Diagnosis not present

## 2021-08-19 DIAGNOSIS — I2510 Atherosclerotic heart disease of native coronary artery without angina pectoris: Secondary | ICD-10-CM | POA: Diagnosis not present

## 2021-08-19 DIAGNOSIS — I509 Heart failure, unspecified: Secondary | ICD-10-CM | POA: Diagnosis not present

## 2021-08-20 DIAGNOSIS — Z992 Dependence on renal dialysis: Secondary | ICD-10-CM | POA: Diagnosis not present

## 2021-08-20 DIAGNOSIS — N186 End stage renal disease: Secondary | ICD-10-CM | POA: Diagnosis not present

## 2021-08-22 DIAGNOSIS — N186 End stage renal disease: Secondary | ICD-10-CM | POA: Diagnosis not present

## 2021-08-22 DIAGNOSIS — Z992 Dependence on renal dialysis: Secondary | ICD-10-CM | POA: Diagnosis not present

## 2021-08-23 DIAGNOSIS — I509 Heart failure, unspecified: Secondary | ICD-10-CM | POA: Diagnosis not present

## 2021-08-25 DIAGNOSIS — N186 End stage renal disease: Secondary | ICD-10-CM | POA: Diagnosis not present

## 2021-08-25 DIAGNOSIS — Z992 Dependence on renal dialysis: Secondary | ICD-10-CM | POA: Diagnosis not present

## 2021-08-27 DIAGNOSIS — N186 End stage renal disease: Secondary | ICD-10-CM | POA: Diagnosis not present

## 2021-08-27 DIAGNOSIS — Z992 Dependence on renal dialysis: Secondary | ICD-10-CM | POA: Diagnosis not present

## 2021-08-29 DIAGNOSIS — N186 End stage renal disease: Secondary | ICD-10-CM | POA: Diagnosis not present

## 2021-08-29 DIAGNOSIS — Z992 Dependence on renal dialysis: Secondary | ICD-10-CM | POA: Diagnosis not present

## 2021-09-01 DIAGNOSIS — Z992 Dependence on renal dialysis: Secondary | ICD-10-CM | POA: Diagnosis not present

## 2021-09-01 DIAGNOSIS — N186 End stage renal disease: Secondary | ICD-10-CM | POA: Diagnosis not present

## 2021-09-02 DIAGNOSIS — I10 Essential (primary) hypertension: Secondary | ICD-10-CM | POA: Diagnosis not present

## 2021-09-02 DIAGNOSIS — J309 Allergic rhinitis, unspecified: Secondary | ICD-10-CM | POA: Diagnosis not present

## 2021-09-02 DIAGNOSIS — E782 Mixed hyperlipidemia: Secondary | ICD-10-CM | POA: Diagnosis not present

## 2021-09-02 DIAGNOSIS — J019 Acute sinusitis, unspecified: Secondary | ICD-10-CM | POA: Diagnosis not present

## 2021-09-03 DIAGNOSIS — N186 End stage renal disease: Secondary | ICD-10-CM | POA: Diagnosis not present

## 2021-09-03 DIAGNOSIS — Z992 Dependence on renal dialysis: Secondary | ICD-10-CM | POA: Diagnosis not present

## 2021-09-05 DIAGNOSIS — Z992 Dependence on renal dialysis: Secondary | ICD-10-CM | POA: Diagnosis not present

## 2021-09-05 DIAGNOSIS — N186 End stage renal disease: Secondary | ICD-10-CM | POA: Diagnosis not present

## 2021-09-08 DIAGNOSIS — Z992 Dependence on renal dialysis: Secondary | ICD-10-CM | POA: Diagnosis not present

## 2021-09-08 DIAGNOSIS — N186 End stage renal disease: Secondary | ICD-10-CM | POA: Diagnosis not present

## 2021-09-10 DIAGNOSIS — N186 End stage renal disease: Secondary | ICD-10-CM | POA: Diagnosis not present

## 2021-09-10 DIAGNOSIS — Z992 Dependence on renal dialysis: Secondary | ICD-10-CM | POA: Diagnosis not present

## 2021-09-12 DIAGNOSIS — I120 Hypertensive chronic kidney disease with stage 5 chronic kidney disease or end stage renal disease: Secondary | ICD-10-CM | POA: Diagnosis not present

## 2021-09-12 DIAGNOSIS — E785 Hyperlipidemia, unspecified: Secondary | ICD-10-CM | POA: Diagnosis not present

## 2021-09-12 DIAGNOSIS — Z992 Dependence on renal dialysis: Secondary | ICD-10-CM | POA: Diagnosis not present

## 2021-09-12 DIAGNOSIS — N186 End stage renal disease: Secondary | ICD-10-CM | POA: Diagnosis not present

## 2021-09-13 DIAGNOSIS — Z992 Dependence on renal dialysis: Secondary | ICD-10-CM | POA: Diagnosis not present

## 2021-09-13 DIAGNOSIS — N186 End stage renal disease: Secondary | ICD-10-CM | POA: Diagnosis not present

## 2021-09-13 DIAGNOSIS — E8779 Other fluid overload: Secondary | ICD-10-CM | POA: Diagnosis not present

## 2021-09-15 DIAGNOSIS — E8779 Other fluid overload: Secondary | ICD-10-CM | POA: Diagnosis not present

## 2021-09-15 DIAGNOSIS — Z992 Dependence on renal dialysis: Secondary | ICD-10-CM | POA: Diagnosis not present

## 2021-09-15 DIAGNOSIS — N186 End stage renal disease: Secondary | ICD-10-CM | POA: Diagnosis not present

## 2021-09-17 DIAGNOSIS — Z992 Dependence on renal dialysis: Secondary | ICD-10-CM | POA: Diagnosis not present

## 2021-09-17 DIAGNOSIS — E8779 Other fluid overload: Secondary | ICD-10-CM | POA: Diagnosis not present

## 2021-09-17 DIAGNOSIS — N186 End stage renal disease: Secondary | ICD-10-CM | POA: Diagnosis not present

## 2021-09-18 DIAGNOSIS — L600 Ingrowing nail: Secondary | ICD-10-CM | POA: Diagnosis not present

## 2021-09-18 DIAGNOSIS — M2012 Hallux valgus (acquired), left foot: Secondary | ICD-10-CM | POA: Diagnosis not present

## 2021-09-18 DIAGNOSIS — M79672 Pain in left foot: Secondary | ICD-10-CM | POA: Diagnosis not present

## 2021-09-18 DIAGNOSIS — M79674 Pain in right toe(s): Secondary | ICD-10-CM | POA: Diagnosis not present

## 2021-09-18 DIAGNOSIS — M722 Plantar fascial fibromatosis: Secondary | ICD-10-CM | POA: Diagnosis not present

## 2021-09-18 DIAGNOSIS — B351 Tinea unguium: Secondary | ICD-10-CM | POA: Diagnosis not present

## 2021-09-18 DIAGNOSIS — M79675 Pain in left toe(s): Secondary | ICD-10-CM | POA: Diagnosis not present

## 2021-09-18 DIAGNOSIS — G8929 Other chronic pain: Secondary | ICD-10-CM | POA: Diagnosis not present

## 2021-09-18 DIAGNOSIS — M7732 Calcaneal spur, left foot: Secondary | ICD-10-CM | POA: Diagnosis not present

## 2021-09-18 DIAGNOSIS — M2011 Hallux valgus (acquired), right foot: Secondary | ICD-10-CM | POA: Diagnosis not present

## 2021-09-19 DIAGNOSIS — N186 End stage renal disease: Secondary | ICD-10-CM | POA: Diagnosis not present

## 2021-09-19 DIAGNOSIS — E8779 Other fluid overload: Secondary | ICD-10-CM | POA: Diagnosis not present

## 2021-09-19 DIAGNOSIS — Z992 Dependence on renal dialysis: Secondary | ICD-10-CM | POA: Diagnosis not present

## 2021-09-21 DIAGNOSIS — E8779 Other fluid overload: Secondary | ICD-10-CM | POA: Diagnosis not present

## 2021-09-21 DIAGNOSIS — N186 End stage renal disease: Secondary | ICD-10-CM | POA: Diagnosis not present

## 2021-09-21 DIAGNOSIS — Z992 Dependence on renal dialysis: Secondary | ICD-10-CM | POA: Diagnosis not present

## 2021-09-22 DIAGNOSIS — E8779 Other fluid overload: Secondary | ICD-10-CM | POA: Diagnosis not present

## 2021-09-22 DIAGNOSIS — N186 End stage renal disease: Secondary | ICD-10-CM | POA: Diagnosis not present

## 2021-09-22 DIAGNOSIS — Z992 Dependence on renal dialysis: Secondary | ICD-10-CM | POA: Diagnosis not present

## 2021-09-23 DIAGNOSIS — I509 Heart failure, unspecified: Secondary | ICD-10-CM | POA: Diagnosis not present

## 2021-09-24 DIAGNOSIS — Z992 Dependence on renal dialysis: Secondary | ICD-10-CM | POA: Diagnosis not present

## 2021-09-24 DIAGNOSIS — N186 End stage renal disease: Secondary | ICD-10-CM | POA: Diagnosis not present

## 2021-09-24 DIAGNOSIS — E8779 Other fluid overload: Secondary | ICD-10-CM | POA: Diagnosis not present

## 2021-09-26 DIAGNOSIS — Z992 Dependence on renal dialysis: Secondary | ICD-10-CM | POA: Diagnosis not present

## 2021-09-26 DIAGNOSIS — N186 End stage renal disease: Secondary | ICD-10-CM | POA: Diagnosis not present

## 2021-09-26 DIAGNOSIS — E8779 Other fluid overload: Secondary | ICD-10-CM | POA: Diagnosis not present

## 2021-09-29 DIAGNOSIS — Z992 Dependence on renal dialysis: Secondary | ICD-10-CM | POA: Diagnosis not present

## 2021-09-29 DIAGNOSIS — N186 End stage renal disease: Secondary | ICD-10-CM | POA: Diagnosis not present

## 2021-09-29 DIAGNOSIS — E8779 Other fluid overload: Secondary | ICD-10-CM | POA: Diagnosis not present

## 2021-10-01 DIAGNOSIS — N186 End stage renal disease: Secondary | ICD-10-CM | POA: Diagnosis not present

## 2021-10-01 DIAGNOSIS — E8779 Other fluid overload: Secondary | ICD-10-CM | POA: Diagnosis not present

## 2021-10-01 DIAGNOSIS — Z992 Dependence on renal dialysis: Secondary | ICD-10-CM | POA: Diagnosis not present

## 2021-10-03 DIAGNOSIS — N186 End stage renal disease: Secondary | ICD-10-CM | POA: Diagnosis not present

## 2021-10-03 DIAGNOSIS — Z992 Dependence on renal dialysis: Secondary | ICD-10-CM | POA: Diagnosis not present

## 2021-10-03 DIAGNOSIS — E8779 Other fluid overload: Secondary | ICD-10-CM | POA: Diagnosis not present

## 2021-10-06 DIAGNOSIS — Z992 Dependence on renal dialysis: Secondary | ICD-10-CM | POA: Diagnosis not present

## 2021-10-06 DIAGNOSIS — E8779 Other fluid overload: Secondary | ICD-10-CM | POA: Diagnosis not present

## 2021-10-06 DIAGNOSIS — N186 End stage renal disease: Secondary | ICD-10-CM | POA: Diagnosis not present

## 2021-10-08 DIAGNOSIS — N186 End stage renal disease: Secondary | ICD-10-CM | POA: Diagnosis not present

## 2021-10-08 DIAGNOSIS — E8779 Other fluid overload: Secondary | ICD-10-CM | POA: Diagnosis not present

## 2021-10-08 DIAGNOSIS — Z992 Dependence on renal dialysis: Secondary | ICD-10-CM | POA: Diagnosis not present

## 2021-10-09 DIAGNOSIS — E039 Hypothyroidism, unspecified: Secondary | ICD-10-CM | POA: Diagnosis not present

## 2021-10-09 DIAGNOSIS — E8779 Other fluid overload: Secondary | ICD-10-CM | POA: Diagnosis not present

## 2021-10-09 DIAGNOSIS — N186 End stage renal disease: Secondary | ICD-10-CM | POA: Diagnosis not present

## 2021-10-09 DIAGNOSIS — N184 Chronic kidney disease, stage 4 (severe): Secondary | ICD-10-CM | POA: Diagnosis not present

## 2021-10-09 DIAGNOSIS — Z992 Dependence on renal dialysis: Secondary | ICD-10-CM | POA: Diagnosis not present

## 2021-10-09 DIAGNOSIS — I10 Essential (primary) hypertension: Secondary | ICD-10-CM | POA: Diagnosis not present

## 2021-10-10 DIAGNOSIS — N186 End stage renal disease: Secondary | ICD-10-CM | POA: Diagnosis not present

## 2021-10-10 DIAGNOSIS — E8779 Other fluid overload: Secondary | ICD-10-CM | POA: Diagnosis not present

## 2021-10-10 DIAGNOSIS — Z992 Dependence on renal dialysis: Secondary | ICD-10-CM | POA: Diagnosis not present

## 2021-10-12 DIAGNOSIS — Z992 Dependence on renal dialysis: Secondary | ICD-10-CM | POA: Diagnosis not present

## 2021-10-12 DIAGNOSIS — N186 End stage renal disease: Secondary | ICD-10-CM | POA: Diagnosis not present

## 2021-10-13 DIAGNOSIS — Z992 Dependence on renal dialysis: Secondary | ICD-10-CM | POA: Diagnosis not present

## 2021-10-13 DIAGNOSIS — N186 End stage renal disease: Secondary | ICD-10-CM | POA: Diagnosis not present

## 2021-10-13 DIAGNOSIS — E8779 Other fluid overload: Secondary | ICD-10-CM | POA: Diagnosis not present

## 2021-10-15 DIAGNOSIS — E8779 Other fluid overload: Secondary | ICD-10-CM | POA: Diagnosis not present

## 2021-10-15 DIAGNOSIS — Z992 Dependence on renal dialysis: Secondary | ICD-10-CM | POA: Diagnosis not present

## 2021-10-15 DIAGNOSIS — N186 End stage renal disease: Secondary | ICD-10-CM | POA: Diagnosis not present

## 2021-10-16 DIAGNOSIS — Z992 Dependence on renal dialysis: Secondary | ICD-10-CM | POA: Diagnosis not present

## 2021-10-16 DIAGNOSIS — N186 End stage renal disease: Secondary | ICD-10-CM | POA: Diagnosis not present

## 2021-10-16 DIAGNOSIS — E8779 Other fluid overload: Secondary | ICD-10-CM | POA: Diagnosis not present

## 2021-10-17 DIAGNOSIS — N186 End stage renal disease: Secondary | ICD-10-CM | POA: Diagnosis not present

## 2021-10-17 DIAGNOSIS — E8779 Other fluid overload: Secondary | ICD-10-CM | POA: Diagnosis not present

## 2021-10-17 DIAGNOSIS — Z992 Dependence on renal dialysis: Secondary | ICD-10-CM | POA: Diagnosis not present

## 2021-10-20 DIAGNOSIS — Z992 Dependence on renal dialysis: Secondary | ICD-10-CM | POA: Diagnosis not present

## 2021-10-20 DIAGNOSIS — N186 End stage renal disease: Secondary | ICD-10-CM | POA: Diagnosis not present

## 2021-10-20 DIAGNOSIS — E8779 Other fluid overload: Secondary | ICD-10-CM | POA: Diagnosis not present

## 2021-10-21 DIAGNOSIS — Z992 Dependence on renal dialysis: Secondary | ICD-10-CM | POA: Diagnosis not present

## 2021-10-21 DIAGNOSIS — N186 End stage renal disease: Secondary | ICD-10-CM | POA: Diagnosis not present

## 2021-10-21 DIAGNOSIS — E8779 Other fluid overload: Secondary | ICD-10-CM | POA: Diagnosis not present

## 2021-10-22 DIAGNOSIS — E8779 Other fluid overload: Secondary | ICD-10-CM | POA: Diagnosis not present

## 2021-10-22 DIAGNOSIS — N186 End stage renal disease: Secondary | ICD-10-CM | POA: Diagnosis not present

## 2021-10-22 DIAGNOSIS — Z992 Dependence on renal dialysis: Secondary | ICD-10-CM | POA: Diagnosis not present

## 2021-10-23 DIAGNOSIS — I509 Heart failure, unspecified: Secondary | ICD-10-CM | POA: Diagnosis not present

## 2021-10-24 DIAGNOSIS — E8779 Other fluid overload: Secondary | ICD-10-CM | POA: Diagnosis not present

## 2021-10-24 DIAGNOSIS — Z992 Dependence on renal dialysis: Secondary | ICD-10-CM | POA: Diagnosis not present

## 2021-10-24 DIAGNOSIS — N186 End stage renal disease: Secondary | ICD-10-CM | POA: Diagnosis not present

## 2021-10-27 DIAGNOSIS — N186 End stage renal disease: Secondary | ICD-10-CM | POA: Diagnosis not present

## 2021-10-27 DIAGNOSIS — E8779 Other fluid overload: Secondary | ICD-10-CM | POA: Diagnosis not present

## 2021-10-27 DIAGNOSIS — Z992 Dependence on renal dialysis: Secondary | ICD-10-CM | POA: Diagnosis not present

## 2021-10-29 DIAGNOSIS — Z992 Dependence on renal dialysis: Secondary | ICD-10-CM | POA: Diagnosis not present

## 2021-10-29 DIAGNOSIS — N186 End stage renal disease: Secondary | ICD-10-CM | POA: Diagnosis not present

## 2021-10-29 DIAGNOSIS — E8779 Other fluid overload: Secondary | ICD-10-CM | POA: Diagnosis not present

## 2021-10-31 DIAGNOSIS — N186 End stage renal disease: Secondary | ICD-10-CM | POA: Diagnosis not present

## 2021-10-31 DIAGNOSIS — E8779 Other fluid overload: Secondary | ICD-10-CM | POA: Diagnosis not present

## 2021-10-31 DIAGNOSIS — Z992 Dependence on renal dialysis: Secondary | ICD-10-CM | POA: Diagnosis not present

## 2021-11-03 DIAGNOSIS — E8779 Other fluid overload: Secondary | ICD-10-CM | POA: Diagnosis not present

## 2021-11-03 DIAGNOSIS — N186 End stage renal disease: Secondary | ICD-10-CM | POA: Diagnosis not present

## 2021-11-03 DIAGNOSIS — Z992 Dependence on renal dialysis: Secondary | ICD-10-CM | POA: Diagnosis not present

## 2021-11-05 DIAGNOSIS — Z992 Dependence on renal dialysis: Secondary | ICD-10-CM | POA: Diagnosis not present

## 2021-11-05 DIAGNOSIS — E8779 Other fluid overload: Secondary | ICD-10-CM | POA: Diagnosis not present

## 2021-11-05 DIAGNOSIS — N186 End stage renal disease: Secondary | ICD-10-CM | POA: Diagnosis not present

## 2021-11-07 DIAGNOSIS — N186 End stage renal disease: Secondary | ICD-10-CM | POA: Diagnosis not present

## 2021-11-07 DIAGNOSIS — Z992 Dependence on renal dialysis: Secondary | ICD-10-CM | POA: Diagnosis not present

## 2021-11-07 DIAGNOSIS — E8779 Other fluid overload: Secondary | ICD-10-CM | POA: Diagnosis not present

## 2021-11-10 DIAGNOSIS — N186 End stage renal disease: Secondary | ICD-10-CM | POA: Diagnosis not present

## 2021-11-10 DIAGNOSIS — Z992 Dependence on renal dialysis: Secondary | ICD-10-CM | POA: Diagnosis not present

## 2021-11-10 DIAGNOSIS — E8779 Other fluid overload: Secondary | ICD-10-CM | POA: Diagnosis not present

## 2021-11-12 DIAGNOSIS — E8779 Other fluid overload: Secondary | ICD-10-CM | POA: Diagnosis not present

## 2021-11-12 DIAGNOSIS — Z992 Dependence on renal dialysis: Secondary | ICD-10-CM | POA: Diagnosis not present

## 2021-11-12 DIAGNOSIS — N186 End stage renal disease: Secondary | ICD-10-CM | POA: Diagnosis not present

## 2021-11-13 DIAGNOSIS — N186 End stage renal disease: Secondary | ICD-10-CM | POA: Diagnosis not present

## 2021-11-13 DIAGNOSIS — Z992 Dependence on renal dialysis: Secondary | ICD-10-CM | POA: Diagnosis not present

## 2021-11-14 DIAGNOSIS — N186 End stage renal disease: Secondary | ICD-10-CM | POA: Diagnosis not present

## 2021-11-14 DIAGNOSIS — Z992 Dependence on renal dialysis: Secondary | ICD-10-CM | POA: Diagnosis not present

## 2021-11-17 DIAGNOSIS — N186 End stage renal disease: Secondary | ICD-10-CM | POA: Diagnosis not present

## 2021-11-17 DIAGNOSIS — Z992 Dependence on renal dialysis: Secondary | ICD-10-CM | POA: Diagnosis not present

## 2021-11-19 DIAGNOSIS — N186 End stage renal disease: Secondary | ICD-10-CM | POA: Diagnosis not present

## 2021-11-19 DIAGNOSIS — Z992 Dependence on renal dialysis: Secondary | ICD-10-CM | POA: Diagnosis not present

## 2021-11-20 DIAGNOSIS — I509 Heart failure, unspecified: Secondary | ICD-10-CM | POA: Diagnosis not present

## 2021-11-20 DIAGNOSIS — I10 Essential (primary) hypertension: Secondary | ICD-10-CM | POA: Diagnosis not present

## 2021-11-20 DIAGNOSIS — I340 Nonrheumatic mitral (valve) insufficiency: Secondary | ICD-10-CM | POA: Diagnosis not present

## 2021-11-20 DIAGNOSIS — I2510 Atherosclerotic heart disease of native coronary artery without angina pectoris: Secondary | ICD-10-CM | POA: Diagnosis not present

## 2021-11-20 DIAGNOSIS — F17200 Nicotine dependence, unspecified, uncomplicated: Secondary | ICD-10-CM | POA: Diagnosis not present

## 2021-11-20 DIAGNOSIS — I25810 Atherosclerosis of coronary artery bypass graft(s) without angina pectoris: Secondary | ICD-10-CM | POA: Diagnosis not present

## 2021-11-20 DIAGNOSIS — Z992 Dependence on renal dialysis: Secondary | ICD-10-CM | POA: Diagnosis not present

## 2021-11-20 DIAGNOSIS — N186 End stage renal disease: Secondary | ICD-10-CM | POA: Diagnosis not present

## 2021-11-20 DIAGNOSIS — I6389 Other cerebral infarction: Secondary | ICD-10-CM | POA: Diagnosis not present

## 2021-11-20 DIAGNOSIS — E782 Mixed hyperlipidemia: Secondary | ICD-10-CM | POA: Diagnosis not present

## 2021-11-21 DIAGNOSIS — Z992 Dependence on renal dialysis: Secondary | ICD-10-CM | POA: Diagnosis not present

## 2021-11-21 DIAGNOSIS — N186 End stage renal disease: Secondary | ICD-10-CM | POA: Diagnosis not present

## 2021-11-23 DIAGNOSIS — I509 Heart failure, unspecified: Secondary | ICD-10-CM | POA: Diagnosis not present

## 2021-11-24 DIAGNOSIS — Z992 Dependence on renal dialysis: Secondary | ICD-10-CM | POA: Diagnosis not present

## 2021-11-24 DIAGNOSIS — N186 End stage renal disease: Secondary | ICD-10-CM | POA: Diagnosis not present

## 2021-11-26 DIAGNOSIS — N186 End stage renal disease: Secondary | ICD-10-CM | POA: Diagnosis not present

## 2021-11-26 DIAGNOSIS — Z992 Dependence on renal dialysis: Secondary | ICD-10-CM | POA: Diagnosis not present

## 2021-11-28 DIAGNOSIS — N186 End stage renal disease: Secondary | ICD-10-CM | POA: Diagnosis not present

## 2021-11-28 DIAGNOSIS — Z992 Dependence on renal dialysis: Secondary | ICD-10-CM | POA: Diagnosis not present

## 2021-12-01 DIAGNOSIS — N186 End stage renal disease: Secondary | ICD-10-CM | POA: Diagnosis not present

## 2021-12-01 DIAGNOSIS — Z992 Dependence on renal dialysis: Secondary | ICD-10-CM | POA: Diagnosis not present

## 2021-12-03 DIAGNOSIS — Z992 Dependence on renal dialysis: Secondary | ICD-10-CM | POA: Diagnosis not present

## 2021-12-03 DIAGNOSIS — N186 End stage renal disease: Secondary | ICD-10-CM | POA: Diagnosis not present

## 2021-12-05 DIAGNOSIS — N186 End stage renal disease: Secondary | ICD-10-CM | POA: Diagnosis not present

## 2021-12-05 DIAGNOSIS — Z992 Dependence on renal dialysis: Secondary | ICD-10-CM | POA: Diagnosis not present

## 2021-12-08 DIAGNOSIS — N186 End stage renal disease: Secondary | ICD-10-CM | POA: Diagnosis not present

## 2021-12-08 DIAGNOSIS — Z992 Dependence on renal dialysis: Secondary | ICD-10-CM | POA: Diagnosis not present

## 2021-12-10 DIAGNOSIS — N186 End stage renal disease: Secondary | ICD-10-CM | POA: Diagnosis not present

## 2021-12-10 DIAGNOSIS — Z992 Dependence on renal dialysis: Secondary | ICD-10-CM | POA: Diagnosis not present

## 2021-12-12 DIAGNOSIS — E785 Hyperlipidemia, unspecified: Secondary | ICD-10-CM | POA: Diagnosis not present

## 2021-12-12 DIAGNOSIS — N186 End stage renal disease: Secondary | ICD-10-CM | POA: Diagnosis not present

## 2021-12-12 DIAGNOSIS — Z992 Dependence on renal dialysis: Secondary | ICD-10-CM | POA: Diagnosis not present

## 2021-12-12 DIAGNOSIS — I120 Hypertensive chronic kidney disease with stage 5 chronic kidney disease or end stage renal disease: Secondary | ICD-10-CM | POA: Diagnosis not present

## 2021-12-13 DIAGNOSIS — Z992 Dependence on renal dialysis: Secondary | ICD-10-CM | POA: Diagnosis not present

## 2021-12-13 DIAGNOSIS — N186 End stage renal disease: Secondary | ICD-10-CM | POA: Diagnosis not present

## 2021-12-15 DIAGNOSIS — Z992 Dependence on renal dialysis: Secondary | ICD-10-CM | POA: Diagnosis not present

## 2021-12-15 DIAGNOSIS — N186 End stage renal disease: Secondary | ICD-10-CM | POA: Diagnosis not present

## 2021-12-17 DIAGNOSIS — Z992 Dependence on renal dialysis: Secondary | ICD-10-CM | POA: Diagnosis not present

## 2021-12-17 DIAGNOSIS — N186 End stage renal disease: Secondary | ICD-10-CM | POA: Diagnosis not present

## 2021-12-19 ENCOUNTER — Non-Acute Institutional Stay: Payer: Medicare Other

## 2021-12-19 ENCOUNTER — Encounter: Payer: Self-pay | Admitting: Internal Medicine

## 2021-12-19 ENCOUNTER — Other Ambulatory Visit: Payer: Self-pay

## 2021-12-19 ENCOUNTER — Inpatient Hospital Stay
Admission: EM | Admit: 2021-12-19 | Discharge: 2021-12-23 | DRG: 291 | Disposition: A | Discharge status: Home / Self Care | Payer: Medicare Other | Attending: Internal Medicine | Admitting: Internal Medicine

## 2021-12-19 ENCOUNTER — Emergency Department: Payer: Medicare Other

## 2021-12-19 DIAGNOSIS — I480 Paroxysmal atrial fibrillation: Secondary | ICD-10-CM | POA: Diagnosis present

## 2021-12-19 DIAGNOSIS — Z885 Allergy status to narcotic agent status: Secondary | ICD-10-CM

## 2021-12-19 DIAGNOSIS — E039 Hypothyroidism, unspecified: Secondary | ICD-10-CM | POA: Diagnosis present

## 2021-12-19 DIAGNOSIS — Z8249 Family history of ischemic heart disease and other diseases of the circulatory system: Secondary | ICD-10-CM

## 2021-12-19 DIAGNOSIS — R0602 Shortness of breath: Secondary | ICD-10-CM | POA: Diagnosis not present

## 2021-12-19 DIAGNOSIS — R188 Other ascites: Secondary | ICD-10-CM | POA: Diagnosis present

## 2021-12-19 DIAGNOSIS — I953 Hypotension of hemodialysis: Secondary | ICD-10-CM | POA: Diagnosis present

## 2021-12-19 DIAGNOSIS — Z8673 Personal history of transient ischemic attack (TIA), and cerebral infarction without residual deficits: Secondary | ICD-10-CM

## 2021-12-19 DIAGNOSIS — R7689 Other specified abnormal immunological findings in serum: Secondary | ICD-10-CM | POA: Diagnosis present

## 2021-12-19 DIAGNOSIS — Z992 Dependence on renal dialysis: Secondary | ICD-10-CM

## 2021-12-19 DIAGNOSIS — E875 Hyperkalemia: Secondary | ICD-10-CM | POA: Diagnosis not present

## 2021-12-19 DIAGNOSIS — Z841 Family history of disorders of kidney and ureter: Secondary | ICD-10-CM

## 2021-12-19 DIAGNOSIS — D638 Anemia in other chronic diseases classified elsewhere: Secondary | ICD-10-CM | POA: Diagnosis present

## 2021-12-19 DIAGNOSIS — K8020 Calculus of gallbladder without cholecystitis without obstruction: Secondary | ICD-10-CM | POA: Diagnosis not present

## 2021-12-19 DIAGNOSIS — N186 End stage renal disease: Secondary | ICD-10-CM

## 2021-12-19 DIAGNOSIS — F0153 Vascular dementia, unspecified severity, with mood disturbance: Secondary | ICD-10-CM | POA: Diagnosis not present

## 2021-12-19 DIAGNOSIS — I5043 Acute on chronic combined systolic (congestive) and diastolic (congestive) heart failure: Secondary | ICD-10-CM | POA: Diagnosis not present

## 2021-12-19 DIAGNOSIS — I4510 Unspecified right bundle-branch block: Secondary | ICD-10-CM | POA: Diagnosis present

## 2021-12-19 DIAGNOSIS — K449 Diaphragmatic hernia without obstruction or gangrene: Secondary | ICD-10-CM | POA: Diagnosis not present

## 2021-12-19 DIAGNOSIS — R768 Other specified abnormal immunological findings in serum: Secondary | ICD-10-CM

## 2021-12-19 DIAGNOSIS — I739 Peripheral vascular disease, unspecified: Secondary | ICD-10-CM | POA: Diagnosis present

## 2021-12-19 DIAGNOSIS — I132 Hypertensive heart and chronic kidney disease with heart failure and with stage 5 chronic kidney disease, or end stage renal disease: Secondary | ICD-10-CM | POA: Diagnosis not present

## 2021-12-19 DIAGNOSIS — D631 Anemia in chronic kidney disease: Secondary | ICD-10-CM | POA: Diagnosis present

## 2021-12-19 DIAGNOSIS — Z91040 Latex allergy status: Secondary | ICD-10-CM

## 2021-12-19 DIAGNOSIS — J9621 Acute and chronic respiratory failure with hypoxia: Secondary | ICD-10-CM | POA: Diagnosis present

## 2021-12-19 DIAGNOSIS — I2510 Atherosclerotic heart disease of native coronary artery without angina pectoris: Secondary | ICD-10-CM | POA: Diagnosis not present

## 2021-12-19 DIAGNOSIS — J9611 Chronic respiratory failure with hypoxia: Secondary | ICD-10-CM | POA: Diagnosis not present

## 2021-12-19 DIAGNOSIS — K219 Gastro-esophageal reflux disease without esophagitis: Secondary | ICD-10-CM | POA: Diagnosis present

## 2021-12-19 DIAGNOSIS — I5042 Chronic combined systolic (congestive) and diastolic (congestive) heart failure: Secondary | ICD-10-CM | POA: Diagnosis present

## 2021-12-19 DIAGNOSIS — E785 Hyperlipidemia, unspecified: Secondary | ICD-10-CM | POA: Diagnosis present

## 2021-12-19 DIAGNOSIS — F02A0 Dementia in other diseases classified elsewhere, mild, without behavioral disturbance, psychotic disturbance, mood disturbance, and anxiety: Secondary | ICD-10-CM | POA: Diagnosis present

## 2021-12-19 DIAGNOSIS — Z881 Allergy status to other antibiotic agents status: Secondary | ICD-10-CM | POA: Diagnosis not present

## 2021-12-19 DIAGNOSIS — I429 Cardiomyopathy, unspecified: Secondary | ICD-10-CM | POA: Diagnosis not present

## 2021-12-19 DIAGNOSIS — I509 Heart failure, unspecified: Secondary | ICD-10-CM | POA: Diagnosis not present

## 2021-12-19 DIAGNOSIS — I252 Old myocardial infarction: Secondary | ICD-10-CM

## 2021-12-19 DIAGNOSIS — E871 Hypo-osmolality and hyponatremia: Secondary | ICD-10-CM | POA: Diagnosis not present

## 2021-12-19 DIAGNOSIS — Z951 Presence of aortocoronary bypass graft: Secondary | ICD-10-CM

## 2021-12-19 DIAGNOSIS — Z7901 Long term (current) use of anticoagulants: Secondary | ICD-10-CM

## 2021-12-19 DIAGNOSIS — Z91048 Other nonmedicinal substance allergy status: Secondary | ICD-10-CM

## 2021-12-19 DIAGNOSIS — Z9981 Dependence on supplemental oxygen: Secondary | ICD-10-CM

## 2021-12-19 DIAGNOSIS — Z79890 Hormone replacement therapy (postmenopausal): Secondary | ICD-10-CM

## 2021-12-19 DIAGNOSIS — N2581 Secondary hyperparathyroidism of renal origin: Secondary | ICD-10-CM | POA: Diagnosis not present

## 2021-12-19 DIAGNOSIS — Z79899 Other long term (current) drug therapy: Secondary | ICD-10-CM

## 2021-12-19 DIAGNOSIS — J811 Chronic pulmonary edema: Secondary | ICD-10-CM | POA: Diagnosis not present

## 2021-12-19 DIAGNOSIS — I9589 Other hypotension: Secondary | ICD-10-CM | POA: Diagnosis present

## 2021-12-19 DIAGNOSIS — F0150 Vascular dementia without behavioral disturbance: Secondary | ICD-10-CM | POA: Diagnosis present

## 2021-12-19 DIAGNOSIS — F17210 Nicotine dependence, cigarettes, uncomplicated: Secondary | ICD-10-CM | POA: Diagnosis present

## 2021-12-19 DIAGNOSIS — E8770 Fluid overload, unspecified: Secondary | ICD-10-CM | POA: Diagnosis not present

## 2021-12-19 DIAGNOSIS — K5900 Constipation, unspecified: Secondary | ICD-10-CM | POA: Diagnosis present

## 2021-12-19 HISTORY — DX: Chronic respiratory failure with hypoxia: J96.11

## 2021-12-19 LAB — CBC WITH DIFFERENTIAL/PLATELET
Abs Immature Granulocytes: 0.03 10*3/uL (ref 0.00–0.07)
Basophils Absolute: 0 10*3/uL (ref 0.0–0.1)
Basophils Relative: 0 %
Eosinophils Absolute: 0.1 10*3/uL (ref 0.0–0.5)
Eosinophils Relative: 2 %
HCT: 35.7 % — ABNORMAL LOW (ref 36.0–46.0)
Hemoglobin: 11.2 g/dL — ABNORMAL LOW (ref 12.0–15.0)
Immature Granulocytes: 1 %
Lymphocytes Relative: 23 %
Lymphs Abs: 1.2 10*3/uL (ref 0.7–4.0)
MCH: 31.2 pg (ref 26.0–34.0)
MCHC: 31.4 g/dL (ref 30.0–36.0)
MCV: 99.4 fL (ref 80.0–100.0)
Monocytes Absolute: 0.9 10*3/uL (ref 0.1–1.0)
Monocytes Relative: 18 %
Neutro Abs: 3 10*3/uL (ref 1.7–7.7)
Neutrophils Relative %: 56 %
Platelets: 110 10*3/uL — ABNORMAL LOW (ref 150–400)
RBC: 3.59 MIL/uL — ABNORMAL LOW (ref 3.87–5.11)
RDW: 16.4 % — ABNORMAL HIGH (ref 11.5–15.5)
WBC: 5.3 10*3/uL (ref 4.0–10.5)
nRBC: 0 % (ref 0.0–0.2)

## 2021-12-19 LAB — HEPATITIS B SURFACE ANTIBODY,QUALITATIVE: Hep B S Ab: NONREACTIVE

## 2021-12-19 LAB — BRAIN NATRIURETIC PEPTIDE: B Natriuretic Peptide: 2326.7 pg/mL — ABNORMAL HIGH (ref 0.0–100.0)

## 2021-12-19 LAB — BASIC METABOLIC PANEL
Anion gap: 9 (ref 5–15)
BUN: 36 mg/dL — ABNORMAL HIGH (ref 6–20)
CO2: 27 mmol/L (ref 22–32)
Calcium: 9.7 mg/dL (ref 8.9–10.3)
Chloride: 100 mmol/L (ref 98–111)
Creatinine, Ser: 5.69 mg/dL — ABNORMAL HIGH (ref 0.44–1.00)
GFR, Estimated: 8 mL/min — ABNORMAL LOW (ref >60)
Glucose, Bld: 92 mg/dL (ref 70–99)
Potassium: 5.2 mmol/L — ABNORMAL HIGH (ref 3.5–5.1)
Sodium: 136 mmol/L (ref 135–145)

## 2021-12-19 LAB — HEPATITIS B SURFACE ANTIGEN: Hepatitis B Surface Ag: NONREACTIVE

## 2021-12-19 LAB — HEPATITIS B CORE ANTIBODY, TOTAL: Hep B Core Total Ab: NONREACTIVE

## 2021-12-19 LAB — TROPONIN I (HIGH SENSITIVITY)
Troponin I (High Sensitivity): 38 ng/L — ABNORMAL HIGH (ref <18)
Troponin I (High Sensitivity): 34 ng/L — ABNORMAL HIGH (ref <18)

## 2021-12-19 LAB — MRSA NEXT GEN BY PCR, NASAL: MRSA by PCR Next Gen: DETECTED — AB

## 2021-12-19 LAB — HEPATITIS C ANTIBODY: HCV Ab: REACTIVE — AB

## 2021-12-19 MED ORDER — LIDOCAINE-PRILOCAINE 2.5-2.5 % EX CREA: 1.0000 | TOPICAL_CREAM | CUTANEOUS | Status: DC | PRN

## 2021-12-19 MED ORDER — ALTEPLASE 2 MG IJ SOLR: 2.0000 mg | Freq: Once | INTRAMUSCULAR | Status: DC | PRN

## 2021-12-19 MED ORDER — CHLORHEXIDINE GLUCONATE CLOTH 2 % EX PADS
6.0000 | MEDICATED_PAD | Freq: Every day | CUTANEOUS | Status: DC
  Filled 2021-12-19: qty 6

## 2021-12-19 MED ORDER — MUPIROCIN 2 % EX OINT
1.0000 | TOPICAL_OINTMENT | Freq: Two times a day (BID) | CUTANEOUS | Status: DC
  Administered 2021-12-19 – 2021-12-23 (×8): 1 via NASAL
  Filled 2021-12-19: qty 22

## 2021-12-19 MED ORDER — LIDOCAINE HCL (PF) 1 % IJ SOLN: 5.0000 mL | INTRAMUSCULAR | Status: DC | PRN

## 2021-12-19 MED ORDER — HEPARIN SODIUM (PORCINE) 1000 UNIT/ML DIALYSIS: 1000.0000 [IU] | INTRAMUSCULAR | Status: DC | PRN

## 2021-12-19 MED ORDER — ORAL CARE MOUTH RINSE: 15.0000 mL | OROMUCOSAL | Status: DC | PRN

## 2021-12-19 MED ORDER — PENTAFLUOROPROP-TETRAFLUOROETH EX AERO: 1.0000 | INHALATION_SPRAY | CUTANEOUS | Status: DC | PRN

## 2021-12-19 MED ORDER — LEVALBUTEROL HCL 0.63 MG/3ML IN NEBU: 0.6300 mg | INHALATION_SOLUTION | Freq: Four times a day (QID) | RESPIRATORY_TRACT | Status: DC | PRN

## 2021-12-19 NOTE — Assessment & Plan Note (Deleted)
Patient appears relatively alert and oriented.  Not on any dementia medications.  Diagnoses taken from previous medical history.

## 2021-12-19 NOTE — Progress Notes (Signed)
[facility]  ROUNDING NOTE   Subjective:   [name] is a 61 year old female with past medical history including congestive heart failure, hypertension, stroke, and end-stage renal disease on hemodialysis.  Patient presents to the emergency room from her dialysis clinic with complaints of shortness of breath and chest discomfort.  Patient seen and evaluated resting on stretcher.  Patient states her shortness of breath began Wednesday prior to dialysis.  She normally requires 2 L nasal cannula at home, she had to increase to 4 L.  She states symptoms did not resolve after receiving her full dialysis treatment on Wednesday, outpatient clinic removed 3.7 L.  Patient states shortness of breath has gotten progressively worse since that time.  Chest discomfort began yesterday evening, described as a dull intermittent pain.  Denies nausea and vomiting.  Denies cough.  Denies fever or chills.  Labs on ED arrival include potassium 5.2 and troponin 38.  Hemoglobin 11.2.  Chest x-ray shows vascular congestion.  We have been consulted to manage dialysis needs.   Objective:  Vital signs in last 24 hours:  Temp:  [97.8 F (36.6 C)-98.2 F (36.8 C)] 97.9 F (36.6 C) (07/07 1345) Pulse Rate:  [44-55] 55 (07/07 1100) Resp:  [11-20] 15 (07/07 1345) BP: (92-137)/(55-122) 122/95 (07/07 1345) SpO2:  [92 %-100 %] 100 % (07/07 1345) Weight:  [70.8 kg-75 kg] 72.4 kg (07/07 1339)  Weight change:  Filed Weights   12/19/21 0548 12/19/21 1017 12/19/21 1339  Weight: 70.8 kg 75 kg 72.4 kg    Intake/Output: No intake/output data recorded.   Intake/Output this shift:  Total I/O In: -  Out: 2000 [Other:2000]  Physical Exam: General: NAD, resting comfortably  Head: Normocephalic, atraumatic. Moist oral mucosal membranes  Eyes: Anicteric  Lungs:  Diminished in bases, normal effort, [place] O2  Heart: Regular rate and rhythm  Abdomen:  Firm, nontender, distended  Extremities: No  peripheral edema.  Neurologic: Nonfocal, moving all four extremities  Skin: No lesions  Access: Left aVF    Basic Metabolic Panel: Recent Labs  Lab 12/19/21 0646  NA 136  K 5.2*  CL 100  CO2 27  GLUCOSE 92  BUN 36*  CREATININE 5.69*  CALCIUM 9.7    Liver Function Tests: No results for input(s): "AST", "ALT", "ALKPHOS", "BILITOT", "PROT", "ALBUMIN" in the last 168 hours. No results for input(s): "LIPASE", "AMYLASE" in the last 168 hours. No results for input(s): "AMMONIA" in the last 168 hours.  CBC: Recent Labs  Lab 12/19/21 0646  WBC 5.3  NEUTROABS 3.0  HGB 11.2*  HCT 35.7*  MCV 99.4  PLT 110*    Cardiac Enzymes: No results for input(s): "CKTOTAL", "CKMB", "CKMBINDEX", "TROPONINI" in the last 168 hours.  BNP: Invalid input(s): "POCBNP"  CBG: No results for input(s): "GLUCAP" in the last 168 hours.  Microbiology: Results for orders placed or performed during the hospital encounter of 08/07/21  Resp Panel by RT-PCR (Flu A&B, Covid) Nasopharyngeal Swab     Status: None   Collection Time: 08/07/21  3:18 PM   Specimen: Nasopharyngeal Swab; Nasopharyngeal(NP) swabs in vial transport medium  Result Value Ref Range Status   SARS Coronavirus 2 by RT PCR NEGATIVE NEGATIVE Final    Comment: (NOTE) SARS-CoV-2 target nucleic acids are NOT DETECTED.  The SARS-CoV-2 RNA is generally detectable in upper respiratory specimens during the acute phase of infection. The lowest concentration of SARS-CoV-2 viral copies this assay can detect is 138 copies/mL. A negative result does not preclude SARS-Cov-2  infection and should not be used as the sole basis for treatment or other patient management decisions. A negative result may occur with  improper specimen collection/handling, submission of specimen other than nasopharyngeal swab, presence of viral mutation(s) within the areas targeted by this assay, and inadequate number of viral copies(<138 copies/mL). A negative result  must be combined with clinical observations, patient history, and epidemiological information. The expected result is Negative.  Fact Sheet for Patients:  EntrepreneurPulse.com.au  Fact Sheet for Healthcare Providers:  IncredibleEmployment.be  This test is no t yet approved or cleared by the [place] FDA and  has been authorized for detection and/or diagnosis of SARS-CoV-2 by FDA under an Emergency Use Authorization (EUA). This EUA will remain  in effect (meaning this test can be used) for the duration of the COVID-19 declaration under Section 564(b)(1) of the Act, 21 U.S.C.section 360bbb-3(b)(1), unless the authorization is terminated  or revoked sooner.       Influenza A by PCR NEGATIVE NEGATIVE Final   Influenza B by PCR NEGATIVE NEGATIVE Final    Comment: (NOTE) The Xpert Xpress SARS-CoV-2/FLU/RSV plus assay is intended as an aid in the diagnosis of influenza from Nasopharyngeal swab specimens and should not be used as a sole basis for treatment. Nasal washings and aspirates are unacceptable for Xpert Xpress SARS-CoV-2/FLU/RSV testing.  Fact Sheet for Patients: EntrepreneurPulse.com.au  Fact Sheet for Healthcare Providers: IncredibleEmployment.be  This test is not yet approved or cleared by the [place] FDA and has been authorized for detection and/or diagnosis of SARS-CoV-2 by FDA under an Emergency Use Authorization (EUA). This EUA will remain in effect (meaning this test can be used) for the duration of the COVID-19 declaration under Section 564(b)(1) of the Act, 21 U.S.C. section 360bbb-3(b)(1), unless the authorization is terminated or revoked.  Performed at [facility], [place], [place] [address]   Respiratory (~20 pathogens) panel by PCR     Status: Abnormal   Collection Time: 08/09/21  6:50 AM   Specimen: Nasopharyngeal Swab; Respiratory  Result Value  Ref Range Status   Adenovirus NOT DETECTED NOT DETECTED Final   Coronavirus 229E NOT DETECTED NOT DETECTED Final    Comment: (NOTE) The Coronavirus on the Respiratory Panel, DOES NOT test for the novel  Coronavirus (2019 nCoV)    Coronavirus HKU1 NOT DETECTED NOT DETECTED Final   Coronavirus NL63 NOT DETECTED NOT DETECTED Final   Coronavirus OC43 NOT DETECTED NOT DETECTED Final   Metapneumovirus NOT DETECTED NOT DETECTED Final   Rhinovirus / Enterovirus DETECTED (A) NOT DETECTED Final   Influenza A NOT DETECTED NOT DETECTED Final   Influenza B NOT DETECTED NOT DETECTED Final   Parainfluenza Virus 1 NOT DETECTED NOT DETECTED Final   Parainfluenza Virus 2 NOT DETECTED NOT DETECTED Final   Parainfluenza Virus 3 NOT DETECTED NOT DETECTED Final   Parainfluenza Virus 4 NOT DETECTED NOT DETECTED Final   Respiratory Syncytial Virus NOT DETECTED NOT DETECTED Final   Bordetella pertussis NOT DETECTED NOT DETECTED Final   Bordetella Parapertussis NOT DETECTED NOT DETECTED Final   Chlamydophila pneumoniae NOT DETECTED NOT DETECTED Final   Mycoplasma pneumoniae NOT DETECTED NOT DETECTED Final    Comment: Performed at [facility], [place]. [address] [place], [place] [address]    Coagulation Studies: No results for input(s): "LABPROT", "INR" in the last 72 hours.  Urinalysis: No results for input(s): "COLORURINE", "LABSPEC", "PHURINE", "GLUCOSEU", "HGBUR", "BILIRUBINUR", "KETONESUR", "PROTEINUR", "UROBILINOGEN", "NITRITE", "LEUKOCYTESUR" in the last 72 hours.  Invalid input(s): "APPERANCEUR"    Imaging: DG Chest Portable 1 View  Result Date: 12/19/2021 CLINICAL DATA:  Shortness of breath EXAM: PORTABLE CHEST 1 VIEW COMPARISON:  08/07/2021 FINDINGS: Chronic cardiomegaly. CABG and coronary stenting. Congested appearance of vessels. No effusion or pneumothorax. No air bronchogram. IMPRESSION: Chronic cardiomegaly and vascular congestion. Electronically Signed   By: Jorje Guild M.D.   On:  12/19/2021 06:25     Medications:     Chlorhexidine Gluconate Cloth  6 each Topical Q0600     Assessment/ Plan:  Ms. LUANA TATRO is a 61 y.o.  female with past medical history including congestive heart failure, hypertension, stroke, and end-stage renal disease on hemodialysis.  Patient presents to the emergency room from her dialysis clinic with complaints of shortness of breath and chest discomfort.  Hyperkalemia with end-stage renal disease on hemodialysis.  Patient reports no recent missed dialysis treatments.  Potassium on arrival 5.2.  Will provide urgent dialysis to correct potassium.  2.  Acute respiratory distress likely due to volume overload versus CHF exacerbation.  Home O2 requirement, 2 L.  Currently placed on 4 L nasal cannula.  Echo from 07/26/2020 shows EF 25 to 30% with a grade 3 diastolic dysfunction and moderate LVH.  Prescribed amiodarone outpatient.  3. Anemia of chronic kidney disease Lab Results  Component Value Date   HGB 11.2 (L) 12/19/2021    Hemoglobin at target.  4. Secondary Hyperparathyroidism:  Lab Results  Component Value Date   PTH 627 (H) 07/26/2020   CALCIUM 9.7 12/19/2021   CAION 0.58 (LL) 04/22/2021   PHOS 5.9 (H) 08/09/2021    Calcium remains within acceptable range however phosphorus elevated.  Patient prescribed sevelamer with meals outpatient.   LOS: 0   7/7/20232:57 PM

## 2021-12-19 NOTE — Assessment & Plan Note (Deleted)
I do not suspect the patient has an acute component to her hypoxia.  She confirms, she normally wears 2 L (which she states she has had to wear since her CABG) and increased to 4 L because of feeling short of breath, but not because she checked her oxygen levels.  She stated on 4 L it made her feel little bit better, although did not resolve her shortness of breath.

## 2021-12-19 NOTE — ED Triage Notes (Signed)
Pt brought in from dialysis with co shob.  Did not get dialysis due to symptoms, last treatment Wednesday. Pt in on o2 at home daily, has been shob for few days.

## 2021-12-19 NOTE — Assessment & Plan Note (Signed)
Patient with history of grade 3 diastolic dysfunction and ejection fraction of 25 to 30%.  Volume is managed by dialysis.

## 2021-12-19 NOTE — ED Provider Notes (Signed)
[facility] Provider Note    Event Date/Time   First MD Initiated Contact with Patient 12/19/21 [phone]     (approximate)   History   Shortness of Breath   HPI  [name] is a 61 y.o. female with history of CHF with EF of 25 to 30% on last echocardiogram in February 2022, hypertension, hyperlipidemia, A-fib on Eliquis, end-stage renal disease on hemodialysis Monday, Wednesday and Friday who presents to the emergency department with EMS for shortness of breath that started today.  Has had some central chest discomfort as well.  No fever but has had productive cough.  No lower extremity swelling or pain.  No history of COPD, PE or DVT.  Does wear oxygen chronically.  No hypoxia with EMS.  Has not missed any dialysis treatments other than what she was supposed to receive today.   History provided by patient and EMS.     Past Medical History:  Diagnosis Date   Adult behavior problems    frontal lobe CVA   Anemia    chronic disease   Cardiomyopathy ([place])    CHF (congestive heart failure) (HCC)    Chicken pox    Coronary artery disease    GERD (gastroesophageal reflux disease)    Heart failure (HCC)    Hepatitis    history of hep c   History of bipolar disorder    Hyperlipidemia    Hypertension    Myocardial infarction [facility])    Obesity    Paroxysmal atrial fibrillation ([place])    Peripheral vascular disease ([place])    Renal failure    Renal insufficiency    Stroke [facility]) 2011   Superficial incisional surgical site infection 03/14/2014    Past Surgical History:  Procedure Laterality Date   A/V FISTULAGRAM Left 03/30/2019   Procedure: A/V FISTULAGRAM;  Surgeon: Algernon Huxley, MD;  Location: [place] [facility];  Service: Cardiovascular;  Laterality: Left;   A/V FISTULAGRAM Left 10/06/2019   Procedure: A/V FISTULAGRAM;  Surgeon: Algernon Huxley, MD;  Location: [place] [facility];  Service: Cardiovascular;  Laterality: Left;   A/V FISTULAGRAM Left  05/19/2021   Procedure: A/V FISTULAGRAM;  Surgeon: Algernon Huxley, MD;  Location: [place] [facility];  Service: Cardiovascular;  Laterality: Left;   COLONOSCOPY     COLONOSCOPY WITH PROPOFOL N/A 04/22/2021   Procedure: COLONOSCOPY WITH PROPOFOL;  Surgeon: Lesly Rubenstein, MD;  Location: [facility] ENDOSCOPY;  Service: Endoscopy;  Laterality: N/A;   CORONARY ANGIOPLASTY WITH STENT PLACEMENT  2013   CORONARY ARTERY BYPASS GRAFT     DIALYSIS FISTULA CREATION     ESOPHAGOGASTRODUODENOSCOPY     ESOPHAGOGASTRODUODENOSCOPY N/A 04/22/2021   Procedure: ESOPHAGOGASTRODUODENOSCOPY (EGD);  Surgeon: Lesly Rubenstein, MD;  Location: [facility] ENDOSCOPY;  Service: Endoscopy;  Laterality: N/A;   FLEXIBLE SIGMOIDOSCOPY N/A 02/23/2019   Procedure: FLEXIBLE SIGMOIDOSCOPY;  Surgeon: Lollie Sails, MD;  Location: [facility] ENDOSCOPY;  Service: Endoscopy;  Laterality: N/A;   LEFT HEART CATH AND CORS/GRAFTS ANGIOGRAPHY N/A 08/08/2018   Procedure: LEFT HEART CATH AND CORS/GRAFTS ANGIOGRAPHY;  Surgeon: Dionisio David, MD;  Location: [place] [facility];  Service: Cardiovascular;  Laterality: N/A;   LEFT HEART CATH AND CORS/GRAFTS ANGIOGRAPHY N/A 01/30/2019   Procedure: LEFT HEART CATH AND CORS/GRAFTS ANGIOGRAPHY;  Surgeon: Dionisio David, MD;  Location: [place] [facility];  Service: Cardiovascular;  Laterality: N/A;   percutaneous insertion intra aortic balloon right cath     ultrasound guided pericardiocentesis  MEDICATIONS:  Prior to Admission medications   Medication Sig Start Date End Date Taking? Authorizing Provider  amiodarone (PACERONE) 200 MG tablet Take 200 mg by mouth daily. 07/30/20   [provider]  apixaban (ELIQUIS) 5 MG TABS tablet Take 5 mg by mouth 2 (two) times daily. 09/11/19   [provider]  carvedilol (COREG) 6.25 MG tablet Take 6.25 mg by mouth 2 (two) times daily.    [provider]  cinacalcet (SENSIPAR) 90 MG tablet Take 90 mg by mouth daily.    [provider]  dapagliflozin propanediol (FARXIGA) 10 MG TABS tablet Take 10 mg by mouth daily.    [provider]  guaiFENesin (MUCINEX) 600 MG 12 hr tablet Take 1 tablet (600 mg total) by mouth 2 (two) times daily. 08/09/21   Danford, Suann Larry, MD  guaiFENesin-dextromethorphan (ROBITUSSIN DM) 100-10 MG/5ML syrup Take 5 mLs by mouth every 4 (four) hours as needed for cough. 08/09/21   Danford, Suann Larry, MD  hydrOXYzine (ATARAX/VISTARIL) 50 MG tablet Take 50 mg by mouth at bedtime.    [provider]  levothyroxine (SYNTHROID) 75 MCG tablet Take 75 mcg by mouth daily before breakfast.  11/02/18   [provider]  lidocaine-prilocaine (EMLA) cream Apply 1 application topically daily as needed (port access).    [provider]  losartan (COZAAR) 25 MG tablet Take 12.5 mg by mouth daily.    [provider]  midodrine (PROAMATINE) 10 MG tablet Take 10 mg by mouth as directed.    [provider]  oxyCODONE-acetaminophen (PERCOCET/ROXICET) 5-325 MG tablet Take 1 tablet by mouth 2 (two) times daily as needed for moderate pain.    [provider]  pantoprazole (PROTONIX) 40 MG tablet Take 40 mg by mouth daily.    [provider]  ranolazine (RANEXA) 1000 MG SR tablet Take 1 tablet (1,000 mg total) by mouth 2 (two) times daily. Increased from 500 mg 2 times daily. Patient taking differently: Take 1,000 mg by mouth 2 (two) times daily. 03/13/21 08/07/21  Enzo Bi, MD  sevelamer carbonate (RENVELA) 800 MG tablet Take 3,200 mg by mouth 3 (three) times daily with meals.    [provider]    Physical Exam   Triage Vital Signs: ED Triage Vitals  Enc Vitals Group     BP 12/19/21 0550 (!) 95/55     Pulse Rate 12/19/21 0550 (!) 51     Resp 12/19/21 0550 20     Temp 12/19/21 0550 98.2 F (36.8 C)     Temp Source 12/19/21 0550 Oral     SpO2 12/19/21 0550 100 %     Weight 12/19/21 0548 156 lb (70.8 kg)     Height  12/19/21 0548 '5\' 6"'$  (1.676 m)     Head Circumference --      Peak Flow --      Pain Score 12/19/21 0548 7     Pain Loc --      Pain Edu? --      Excl. in [place]? --     Most recent vital signs: Vitals:   12/19/21 0730 12/19/21 0800  BP: 97/74 111/67  Pulse:  (!) 49  Resp: 20 16  Temp:    SpO2:  97%    CONSTITUTIONAL: Alert and oriented and responds appropriately to questions. Well-appearing; well-nourished HEAD: Normocephalic, atraumatic EYES: Conjunctivae clear, pupils appear equal, sclera nonicteric ENT: normal nose; moist mucous membranes NECK: Supple, normal ROM CARD: RRR; S1 and S2  appreciated; no murmurs, no clicks, no rubs, no gallops RESP: Normal chest excursion without splinting or tachypnea; breath sounds clear and equal bilaterally; no wheezes, no rhonchi, no rales, no hypoxia or respiratory distress, speaking full sentences ABD/GI: Normal bowel sounds; non-distended; soft, non-tender, no rebound, no guarding, no peritoneal signs BACK: The back appears normal EXT: Normal ROM in all joints; no deformity noted, no edema; no cyanosis no calf tenderness or calf swelling, fistula in the left upper extremity with normal thrill and bruit SKIN: Normal color for age and race; warm; no rash on exposed skin NEURO: Moves all extremities equally, normal speech PSYCH: The patient's mood and manner are appropriate.   ED Results / Procedures / Treatments   LABS: (all labs ordered are listed, but only abnormal results are displayed) Labs Reviewed  CBC WITH DIFFERENTIAL/PLATELET - Abnormal; Notable for the following components:      Result Value   RBC 3.59 (*)    Hemoglobin 11.2 (*)    HCT 35.7 (*)    RDW 16.4 (*)    Platelets 110 (*)    All other components within normal limits  BASIC METABOLIC PANEL - Abnormal; Notable for the following components:   Potassium 5.2 (*)    BUN 36 (*)    Creatinine, Ser 5.69 (*)    GFR, Estimated 8 (*)    All other components within normal  limits  BRAIN NATRIURETIC PEPTIDE - Abnormal; Notable for the following components:   B Natriuretic Peptide 2,326.7 (*)    All other components within normal limits  TROPONIN I (HIGH SENSITIVITY) - Abnormal; Notable for the following components:   Troponin I (High Sensitivity) 38 (*)    All other components within normal limits  TROPONIN I (HIGH SENSITIVITY)     EKG:    Date: 12/19/2021 5:58 AM  Rate: 49  Rhythm: sinus bradycardia  QRS Axis: normal  Intervals: 1st degree AV block  ST/T Wave abnormalities: normal  Conduction Disutrbances: none  Narrative Interpretation: sinus bradycardia, RBBB     RADIOLOGY: My personal review and interpretation of imaging:  CXR shows vascular congestion.  I have personally reviewed all radiology reports.   DG Chest Portable 1 View  Result Date: 12/19/2021 CLINICAL DATA:  Shortness of breath EXAM: PORTABLE CHEST 1 VIEW COMPARISON:  08/07/2021 FINDINGS: Chronic cardiomegaly. CABG and coronary stenting. Congested appearance of vessels. No effusion or pneumothorax. No air bronchogram. IMPRESSION: Chronic cardiomegaly and vascular congestion. Electronically Signed   By: Jorje Guild M.D.   On: 12/19/2021 06:25     PROCEDURES:  Critical Care performed: No     .1-3 Lead EKG Interpretation  Performed by: Trestan Vahle, [name], DO Authorized by: Yevonne Yokum, [name], DO     Interpretation: abnormal     ECG rate:  51   ECG rate assessment: bradycardic     Rhythm: sinus bradycardia     Ectopy: none     Conduction: normal       IMPRESSION / MDM / ASSESSMENT AND PLAN / ED COURSE  I reviewed the triage vital signs and the nursing notes.    Patient here with chest pain, shortness of breath.  The patient is on the cardiac monitor to evaluate for evidence of arrhythmia and/or significant heart rate changes.   DIFFERENTIAL DIAGNOSIS (includes but not limited to):   CHF exacerbation, volume overload from missed dialysis today, PE, ACS, anemia,  pneumonia, pneumothorax   Patient's presentation is most consistent with acute presentation with potential threat to life  or bodily function.   PLAN: We will obtain CBC, BMP, troponin x2, BNP, chest x-ray, EKG.   MEDICATIONS GIVEN IN ED: Medications - No data to display   ED COURSE: Patient's labs are pending.  EKG shows right bundle branch block and sinus bradycardia.  No significant change compared to previous.  Chest x-ray reviewed/interpreted by myself and radiology and shows vascular congestion without overt edema.  She has not had any increasing oxygen requirement here.  Signed out the oncoming ED physician to follow-up on labs and troponin x2.  Anticipate if this is unremarkable, touching base with nephrology to schedule dialysis session for her prior to Monday since she missed her dialysis session today on Friday.   CONSULTS: Dispo pending further work-up.   OUTSIDE RECORDS REVIEWED: Reviewed patient's last podiatry note with Sharlotte Alamo on 09/18/2021.       FINAL CLINICAL IMPRESSION(S) / ED DIAGNOSES   Final diagnoses:  Shortness of breath     Rx / DC Orders   ED Discharge Orders     None        Note:  This document was prepared using Dragon voice recognition software and may include unintentional dictation errors.   Angie Piercey, [name], DO 12/19/21 [phone]

## 2021-12-19 NOTE — ED Provider Notes (Signed)
Patient returns from dialysis.  She was seen overnight with increasing dyspnea.  Sent from dialysis.  Patient was able to have 2 L of fluid drawn off at dialysis.  On my assessment she tells me she continues to feel dyspneic does not feel much improved.  Is noting increasing abdominal distention which she thinks is contributing to her dyspnea.  She is on 4 L right now baseline 2 L but satting 100%.  Her abdomen is distended minimally tender throughout.  Plan to obtain CT abdomen pelvis without contrast to evaluate for any acute intra-abdominal process and to assess the burden of what I expect to be ascites.  Patient may need admission for paracentesis and further dialysis.  CT of the abdomen pelvis shows large volume intraperitoneal free fluid.  On record review it is documented that patient has a history of cirrhosis.  She had was treated for hep C.  Patient unaware of any liver issues.  Etiology of her ascites still unclear at this time.  Given she has never had paracentesis before and is quite symptomatic I think is most appropriate that she be admitted for paracentesis and further work-up and further dialysis.   Rada Hay, MD 12/19/21 [phone]

## 2021-12-19 NOTE — Assessment & Plan Note (Signed)
Secondary to dialysis.  Continue midodrine.

## 2021-12-19 NOTE — Assessment & Plan Note (Signed)
Continue Synthroid °

## 2021-12-19 NOTE — Assessment & Plan Note (Deleted)
Nephrology consulted.  Patient underwent full dialysis session 7/5 with no improvement in her symptoms.  Seen by nephrology and taken for dialysis today.  Despite dialysis, no improvement in her symptoms.

## 2021-12-19 NOTE — Assessment & Plan Note (Deleted)
Unclear etiology.  Previous CTs noted no evidence of cirrhotic appearing liver.  Patient is positive for hepatitis C antibody.  Consulted interventional radiology for therapeutic/diagnostic paracentesis.  Suspect other etiology.  Have ordered full set of labs for paracentesis including cytology.  No evidence of SBP or infection.

## 2021-12-19 NOTE — ED Notes (Signed)
Consent signed for dialysis. Transport here to get pt

## 2021-12-19 NOTE — ED Provider Notes (Signed)
Procedures     ----------------------------------------- 9:51 AM on 12/19/2021 ----------------------------------------- Work-up is all reassuring in the ED.  Last dialysis was 2 days ago.  Contacted nephrology to coordinate make-up dialysis today so the patient does not have a prolonged gap between sessions.  They will perform dialysis here at the hospital today.  Afterward, I expect patient will be stable for discharge home.     Carrie Mew, MD 12/19/21 [phone]

## 2021-12-19 NOTE — ED Notes (Signed)
Pt's daughter, Larene Beach [phone] was called and updated on patient's pending admission and plan of care with permission from patient. Pt spoke to daughter on the phone as well.  Pt's brother at bedside. Pt given meal and drink upon request. No further needs expressed.

## 2021-12-19 NOTE — Hospital Course (Addendum)
61 year old female with past medical history of systolic/diastolic heart failure, chronic respiratory failure with hypoxia on 2 L nasal cannula, atrial fibrillation on Eliquis, end-stage renal disease on hemodialysis and hypertension who presented to the emergency room in the early morning of 7/7 with complaints of shortness of breath that had been going on for several days including prior to her last hemodialysis session on 7/5.  Her last hemodialysis session was on 7/5, and although it was a full session, patient's symptoms did not improve.  Patient has had to increase her oxygen to 4 L.  She had not not checked her oxygen at home and just said by increasing oxygen, it made her feel little bit better, but did not relieve her symptoms.  She was not able to sleep last night due to shortness of breath, so she presented to the emergency room.    In the emergency room, patient found to have a potassium of 5.2 and signs consistent with volume overload requiring more dialysis.  Abdominal CT noted large volume ascites.  (Patient states that she feels like her belly has slowly gotten bigger over the past month)  Patient had hepatitis panel checked and found to be positive for hepatitis C antibody.  Patient states that she did not know about any liver issues, although she does recall being told a long time ago that she did have hepatitis C.  Hospitalists were called for admission.  Interventional radiology consulted for paracentesis.  Today's patient's regular day for dialysis.  She presented to the emergency room early this morning was seen by nephrology and then taken for routine dialysis today.  Next dialysis scheduled for Monday.

## 2021-12-19 NOTE — Assessment & Plan Note (Signed)
After ascites seen, emergency room physician ordered hepatitis panel which was positive for hepatitis C antibody.  When this was discussed with the patient, she stated that she was not aware of any liver problems, but does recall being told that she had hepatitis C in the past.  I have ordered a quantitative count

## 2021-12-19 NOTE — H&P (Signed)
History and Physical    Patient: [name] FUX:[id number] DOB: [birth year] DOA: 12/19/2021 DOS: the patient was seen and examined on 12/19/2021 PCP: [name], MD  Patient coming from: Home  Chief Complaint:  Chief Complaint  Patient presents with   Shortness of Breath   HPI: 61 year old female with past medical history of systolic/diastolic heart failure, chronic respiratory failure with hypoxia on 2 L nasal cannula, atrial fibrillation on Eliquis, end-stage renal disease on hemodialysis and hypertension who presented to the emergency room in the early morning of 7/7 with complaints of shortness of breath that had been going on for several days including prior to her last hemodialysis session on 7/5.  Her last hemodialysis session was on 7/5, and although it was a full session, patient's symptoms did not improve.  Patient has had to increase her oxygen to 4 L.  She had not not checked her oxygen at home and just said by increasing oxygen, it made her feel little bit better, but did not relieve her symptoms.  She was not able to sleep last night due to shortness of breath, so she presented to the emergency room.    In the emergency room, patient found to have a potassium of 5.2 and signs consistent with volume overload requiring more dialysis.  Abdominal CT noted large volume ascites.  (Patient states that she feels like her belly has slowly gotten bigger over the past month)  Patient had hepatitis panel checked and found to be positive for hepatitis C antibody.  Patient states that she did not know about any liver issues, although she does recall being told a long time ago that she did have hepatitis C.  Hospitalists were called for admission.  Interventional radiology consulted for paracentesis.  Nephrology consulted for dialysis.  Review of Systems: As mentioned in the history of present illness. All other systems reviewed and are negative. Past Medical History:  Diagnosis Date   Adult  behavior problems    frontal lobe CVA   Anemia    chronic disease   Cardiomyopathy ([place])    CHF (congestive heart failure) (HCC)    Chicken pox    Coronary artery disease    GERD (gastroesophageal reflux disease)    Heart failure (HCC)    Hepatitis    history of hep c   History of bipolar disorder    Hyperlipidemia    Hypertension    Myocardial infarction [facility])    Obesity    Paroxysmal atrial fibrillation ([place])    Peripheral vascular disease ([place])    Renal failure    Renal insufficiency    Stroke [facility]) 2011   Superficial incisional surgical site infection 03/14/2014   Past Surgical History:  Procedure Laterality Date   A/V FISTULAGRAM Left 03/30/2019   Procedure: A/V FISTULAGRAM;  Surgeon: Algernon Huxley, MD;  Location: [place] [facility];  Service: Cardiovascular;  Laterality: Left;   A/V FISTULAGRAM Left 10/06/2019   Procedure: A/V FISTULAGRAM;  Surgeon: Algernon Huxley, MD;  Location: [place] [facility];  Service: Cardiovascular;  Laterality: Left;   A/V FISTULAGRAM Left 05/19/2021   Procedure: A/V FISTULAGRAM;  Surgeon: Algernon Huxley, MD;  Location: [place] [facility];  Service: Cardiovascular;  Laterality: Left;   COLONOSCOPY     COLONOSCOPY WITH PROPOFOL N/A 04/22/2021   Procedure: COLONOSCOPY WITH PROPOFOL;  Surgeon: Lesly Rubenstein, MD;  Location: [facility] ENDOSCOPY;  Service: Endoscopy;  Laterality: N/A;   CORONARY ANGIOPLASTY WITH STENT PLACEMENT  2013  CORONARY ARTERY BYPASS GRAFT     DIALYSIS FISTULA CREATION     ESOPHAGOGASTRODUODENOSCOPY     ESOPHAGOGASTRODUODENOSCOPY N/A 04/22/2021   Procedure: ESOPHAGOGASTRODUODENOSCOPY (EGD);  Surgeon: Lesly Rubenstein, MD;  Location: [facility] ENDOSCOPY;  Service: Endoscopy;  Laterality: N/A;   FLEXIBLE SIGMOIDOSCOPY N/A 02/23/2019   Procedure: FLEXIBLE SIGMOIDOSCOPY;  Surgeon: Lollie Sails, MD;  Location: [facility] ENDOSCOPY;  Service: Endoscopy;  Laterality: N/A;   LEFT HEART CATH AND CORS/GRAFTS ANGIOGRAPHY N/A 08/08/2018    Procedure: LEFT HEART CATH AND CORS/GRAFTS ANGIOGRAPHY;  Surgeon: Dionisio David, MD;  Location: [place] [facility];  Service: Cardiovascular;  Laterality: N/A;   LEFT HEART CATH AND CORS/GRAFTS ANGIOGRAPHY N/A 01/30/2019   Procedure: LEFT HEART CATH AND CORS/GRAFTS ANGIOGRAPHY;  Surgeon: Dionisio David, MD;  Location: [place] [facility];  Service: Cardiovascular;  Laterality: N/A;   percutaneous insertion intra aortic balloon right cath     ultrasound guided pericardiocentesis     Social History: Patient denies any alcohol or drug use.  She smokes 1 cigarette a day and not more.  She denies any alcohol or drug use.  She lives at home with her family and ambulates without assistance.  Allergies  Allergen Reactions   Morphine And Related Hives   Levaquin [Levofloxacin] Itching    Severe itching; prickly sensation   Other    Latex Rash   Tape Rash    Family History  Problem Relation Age of Onset   Hypertension Mother    Ovarian cancer Mother    Diabetes type II Mother    Hypertension Father    Kidney disease Father    Hypertension Sister    Diabetes type II Maternal Grandmother    Breast cancer Maternal Grandmother    Breast cancer Maternal Aunt    Hypertension Other    Cancer Other    Renal Disease Other     Prior to Admission medications   Medication Sig Start Date End Date Taking? Authorizing Provider  amiodarone (PACERONE) 200 MG tablet Take 200 mg by mouth daily. 07/30/20  Yes [provider]  apixaban (ELIQUIS) 5 MG TABS tablet Take 5 mg by mouth 2 (two) times daily. 09/11/19  Yes [provider]  carvedilol (COREG) 6.25 MG tablet Take 6.25 mg by mouth 2 (two) times daily.   Yes [provider]  cinacalcet (SENSIPAR) 90 MG tablet Take 90 mg by mouth daily.   Yes [provider]  dapagliflozin propanediol (FARXIGA) 10 MG TABS tablet Take 10 mg by mouth daily.   Yes [provider]  hydrOXYzine (ATARAX/VISTARIL) 50 MG  tablet Take 50 mg by mouth at bedtime.   Yes [provider]  levothyroxine (SYNTHROID) 75 MCG tablet Take 75 mcg by mouth daily before breakfast.  11/02/18  Yes [provider]  losartan (COZAAR) 25 MG tablet Take 12.5 mg by mouth daily.   Yes [provider]  midodrine (PROAMATINE) 10 MG tablet Take 10 mg by mouth as directed.   Yes [provider]  ranolazine (RANEXA) 1000 MG SR tablet Take 1 tablet (1,000 mg total) by mouth 2 (two) times daily. Increased from 500 mg 2 times daily. Patient taking differently: Take 1,000 mg by mouth 2 (two) times daily. 03/13/21 12/19/21 Yes Enzo Bi, MD  rosuvastatin (CRESTOR) 20 MG tablet Take 20 mg by mouth at bedtime. 11/07/21  Yes [provider]  sevelamer carbonate (RENVELA) 800 MG tablet Take 3,200 mg by mouth 3 (three) times daily with meals.  Yes [provider]  guaiFENesin (MUCINEX) 600 MG 12 hr tablet Take 1 tablet (600 mg total) by mouth 2 (two) times daily. 08/09/21   Danford, Suann Larry, MD  guaiFENesin-dextromethorphan (ROBITUSSIN DM) 100-10 MG/5ML syrup Take 5 mLs by mouth every 4 (four) hours as needed for cough. 08/09/21   Danford, Suann Larry, MD  lidocaine-prilocaine (EMLA) cream Apply 1 application topically daily as needed (port access).    [provider]  oxyCODONE-acetaminophen (PERCOCET/ROXICET) 5-325 MG tablet Take 1 tablet by mouth 2 (two) times daily as needed for moderate pain.    [provider]  pantoprazole (PROTONIX) 40 MG tablet Take 40 mg by mouth daily.    [provider]    Physical Exam: Vitals:   12/19/21 1330 12/19/21 1339 12/19/21 1345 12/19/21 1415  BP: 114/69  (!) 122/95 127/81  Pulse:    (!) 58  Resp: '19  15 19  '$ Temp:   97.9 F (36.6 C)   TempSrc:   Oral   SpO2: 100%  100% 100%  Weight:  72.4 kg    Height:       General: Alert and oriented x3, no acute distress HEENT: Normocephalic atraumatic, mucous membranes are moist.  Neck  is supple, no JVD Cardiovascular: Regular rate and rhythm, S1-S2 Lungs: Moderate inspiratory effort with decreased breath sounds bibasilar Abdomen: Somewhat firm, distended, and nontender, hypoactive bowel sounds Extremities: No clubbing or cyanosis, trace pitting edema Skin: No skin breaks, tears or lesions Neuro: No focal deficits Psychiatry: Patient is appropriate, no evidence of psychoses  Data Reviewed:  Lab work noteworthy for creatinine of 5.69, potassium of 5.2 ammonia of 36, BNP of 2300 and platelet count of 110 .  EKG notes sinus bradycardia with right bundle branch block and other than the bradycardia, is similar to previous EKG in February  Assessment and Plan: * Ascites Unclear etiology.  Previous CTs noted no evidence of cirrhotic appearing liver.  Patient is positive for hepatitis C antibody.  Consulted interventional radiology for therapeutic/diagnostic paracentesis.  ESRD on hemodialysis [facility]) Nephrology consulted.  Patient underwent full dialysis session 7/5 with no improvement in her symptoms.  Although she states that she needs to increase her oxygen, she is 100% on 4 L.  Possible ascites may be causing decreased ability to expand her lungs and feeling of shortness of breath.  Chronic combined systolic and diastolic CHF (congestive heart failure) ([place]) Patient with history of grade 3 diastolic dysfunction and ejection fraction of 25 to 30%.  Volume is managed by dialysis.  Chronic respiratory failure with hypoxia (HCC) Unclear if acute.  Patient normally wears 2 L and states that she needs to go up to 4, but however this may be more sensation of shortness of breath rather than true worsening hypoxia, secondary to ascites.  Nephrology planning for dialysis.  Interventional radiology consulted for paracentesis.  Paroxysmal atrial fibrillation (HCC) Rate controlled, on Eliquis  Chronic hypotension Secondary to dialysis.  Continue midodrine.  Arteriosclerotic dementia  [facility]) Patient appears relatively alert and oriented.  Not on any dementia medications.  Diagnoses taken from previous medical history.  Hypothyroidism Continue Synthroid      Advance Care Planning: Full code  Consults:  -Interventional radiology -Nephrology  Family Communication: Left message for family  Severity of Illness: The appropriate patient status for this patient is INPATIENT. Inpatient status is judged to be reasonable and necessary in order to provide the required intensity of service to ensure the patient's safety. The patient's presenting symptoms, physical  exam findings, and initial radiographic and laboratory data in the context of their chronic comorbidities is felt to place them at high risk for further clinical deterioration. Furthermore, it is not anticipated that the patient will be medically stable for discharge from the hospital within 2 midnights of admission.   * I certify that at the point of admission it is my clinical judgment that the patient will require inpatient hospital care spanning beyond 2 midnights from the point of admission due to high intensity of service, high risk for further deterioration and high frequency of surveillance required.*  Author: Annita Brod, MD 12/19/2021 6:45 PM  For on call review [email or web address].

## 2021-12-19 NOTE — Assessment & Plan Note (Signed)
Rate controlled, on Eliquis

## 2021-12-19 NOTE — Progress Notes (Signed)
Hemodialysis Post Treatment Note:  Tx date:12/19/2021 Tx time: 3 hours  Access: Left AVF UF Removed: 2 L  Note: HD treatment completed. Tolerated well. NO HD complications noted. Post HD BP 122/95.

## 2021-12-20 ENCOUNTER — Inpatient Hospital Stay: Payer: Medicare Other

## 2021-12-20 DIAGNOSIS — R188 Other ascites: Secondary | ICD-10-CM | POA: Diagnosis not present

## 2021-12-20 DIAGNOSIS — Z992 Dependence on renal dialysis: Secondary | ICD-10-CM | POA: Diagnosis not present

## 2021-12-20 DIAGNOSIS — N186 End stage renal disease: Secondary | ICD-10-CM | POA: Diagnosis not present

## 2021-12-20 LAB — BODY FLUID CELL COUNT WITH DIFFERENTIAL
Lymphs, Fluid: 68 %
Monocyte-Macrophage-Serous Fluid: 28 %
Neutrophil Count, Fluid: 4 %
Total Nucleated Cell Count, Fluid: 4585 cu mm

## 2021-12-20 LAB — GLUCOSE, PLEURAL OR PERITONEAL FLUID: Glucose, Fluid: 99 mg/dL

## 2021-12-20 LAB — PROTEIN, PLEURAL OR PERITONEAL FLUID: Total protein, fluid: 4.9 g/dL

## 2021-12-20 LAB — LACTATE DEHYDROGENASE, PLEURAL OR PERITONEAL FLUID: LD, Fluid: 83 U/L — ABNORMAL HIGH (ref 3–23)

## 2021-12-20 LAB — HEPATITIS B SURFACE ANTIBODY, QUANTITATIVE: Hep B S AB Quant (Post): <3.1 m[IU]/mL — ABNORMAL LOW (ref >9.9)

## 2021-12-20 LAB — ALBUMIN, PLEURAL OR PERITONEAL FLUID: Albumin, Fluid: 2.7 g/dL

## 2021-12-20 MED ORDER — ALBUMIN HUMAN 25 % IV SOLN
25.0000 g | Freq: Once | INTRAVENOUS | Status: AC
  Administered 2021-12-20: 25 g via INTRAVENOUS
  Filled 2021-12-20: qty 100

## 2021-12-20 MED ORDER — MIDODRINE HCL 5 MG PO TABS
10.0000 mg | ORAL_TABLET | ORAL | Status: DC
  Administered 2021-12-22: 10 mg via ORAL
  Filled 2021-12-20: qty 2

## 2021-12-20 MED ORDER — OXYCODONE-ACETAMINOPHEN 5-325 MG PO TABS
1.0000 | ORAL_TABLET | Freq: Two times a day (BID) | ORAL | Status: DC | PRN
  Administered 2021-12-21 – 2021-12-23 (×4): 1 via ORAL
  Filled 2021-12-20 (×4): qty 1

## 2021-12-20 MED ORDER — LEVOTHYROXINE SODIUM 50 MCG PO TABS
75.0000 ug | ORAL_TABLET | Freq: Every day | ORAL | Status: DC
  Administered 2021-12-20 – 2021-12-23 (×4): 75 ug via ORAL
  Filled 2021-12-20 (×4): qty 2

## 2021-12-20 MED ORDER — GUAIFENESIN ER 600 MG PO TB12
600.0000 mg | ORAL_TABLET | Freq: Two times a day (BID) | ORAL | Status: DC
  Administered 2021-12-20 – 2021-12-23 (×6): 600 mg via ORAL
  Filled 2021-12-20 (×7): qty 1

## 2021-12-20 MED ORDER — PANTOPRAZOLE SODIUM 40 MG PO TBEC
40.0000 mg | DELAYED_RELEASE_TABLET | Freq: Every day | ORAL | Status: DC
  Administered 2021-12-21 – 2021-12-23 (×3): 40 mg via ORAL
  Filled 2021-12-20 (×4): qty 1

## 2021-12-20 MED ORDER — SEVELAMER CARBONATE 800 MG PO TABS
3200.0000 mg | ORAL_TABLET | Freq: Three times a day (TID) | ORAL | Status: DC
  Administered 2021-12-20 – 2021-12-23 (×8): 3200 mg via ORAL
  Filled 2021-12-20 (×8): qty 4

## 2021-12-20 MED ORDER — CHLORHEXIDINE GLUCONATE CLOTH 2 % EX PADS
6.0000 | MEDICATED_PAD | Freq: Every day | CUTANEOUS | Status: DC
  Administered 2021-12-20 – 2021-12-23 (×4): 6 via TOPICAL

## 2021-12-20 MED ORDER — ONDANSETRON HCL 4 MG/2ML IJ SOLN
4.0000 mg | Freq: Four times a day (QID) | INTRAMUSCULAR | Status: DC | PRN
  Administered 2021-12-21 – 2021-12-23 (×2): 4 mg via INTRAVENOUS
  Filled 2021-12-20 (×2): qty 2

## 2021-12-20 MED ORDER — CINACALCET HCL 30 MG PO TABS
90.0000 mg | ORAL_TABLET | Freq: Every day | ORAL | Status: DC
Start: 2021-12-20 — End: 2021-12-22
  Administered 2021-12-20: 90 mg via ORAL
  Filled 2021-12-20 (×4): qty 3

## 2021-12-20 MED ORDER — APIXABAN 5 MG PO TABS
5.0000 mg | ORAL_TABLET | Freq: Two times a day (BID) | ORAL | Status: DC
Start: 2021-12-20 — End: 2021-12-23
  Administered 2021-12-20 – 2021-12-23 (×6): 5 mg via ORAL
  Filled 2021-12-20 (×6): qty 1

## 2021-12-20 MED ORDER — ROSUVASTATIN CALCIUM 10 MG PO TABS
20.0000 mg | ORAL_TABLET | Freq: Every day | ORAL | Status: DC
Start: 2021-12-20 — End: 2021-12-23
  Administered 2021-12-20 – 2021-12-22 (×3): 20 mg via ORAL
  Filled 2021-12-20 (×4): qty 2

## 2021-12-20 MED ORDER — AMIODARONE HCL 200 MG PO TABS
200.0000 mg | ORAL_TABLET | Freq: Every day | ORAL | Status: DC
  Administered 2021-12-20 – 2021-12-23 (×4): 200 mg via ORAL
  Filled 2021-12-20 (×4): qty 1

## 2021-12-20 NOTE — Progress Notes (Signed)
[facility]  ROUNDING NOTE   Subjective:   [name] is a 61 year old female with past medical history including congestive heart failure, hypertension, stroke, and end-stage renal disease on hemodialysis.  Patient presents to the emergency room from her dialysis clinic with complaints of shortness of breath and chest discomfort.  Patient seen and evaluated resting on stretcher.  Patient states her shortness of breath began Wednesday prior to dialysis.  She normally requires 2 L nasal cannula at home, she had to increase to 4 L.  She states symptoms did not resolve after receiving her full dialysis treatment on Wednesday, outpatient clinic removed 3.7 L.  Patient states shortness of breath has gotten progressively worse since that time.  Chest discomfort began yesterday evening, described as a dull intermittent pain.  Denies nausea and vomiting.  Denies cough.  Denies fever or chills.  Labs on ED arrival include potassium 5.2 and troponin 38.  Hemoglobin 11.2.  Chest x-ray shows vascular congestion.  Nephrology was consulted for comanagement of dialysis patient Patient was seen today on second floor Patient resting comfortably in the bed Patient offers no new specific physical complaints   Objective:  Vital signs in last 24 hours:  Temp:  [97.8 F (36.6 C)-98.2 F (36.8 C)] 97.8 F (36.6 C) (07/08 0411) Pulse Rate:  [44-77] 77 (07/08 0411) Resp:  [11-22] 22 (07/08 0411) BP: (92-137)/(55-122) 115/75 (07/08 0411) SpO2:  [92 %-100 %] 100 % (07/08 0411) Weight:  [72.4 kg-75 kg] 72.4 kg (07/07 1339)  Weight change: 4.218 kg Filed Weights   12/19/21 0548 12/19/21 1017 12/19/21 1339  Weight: 70.8 kg 75 kg 72.4 kg    Intake/Output: I/O last 3 completed shifts: In: -  Out: 2000 [Other:2000]   Intake/Output this shift:  Total I/O In: 60 [P.O.:60] Out: 0   Physical Exam: General: NAD, resting comfortably  Head: Normocephalic, atraumatic. Moist oral mucosal  membranes  Eyes: Anicteric  Lungs:  Diminished in bases, normal effort, [place] O2  Heart: Regular rate and rhythm  Abdomen:  Firm, nontender, distended  Extremities: No peripheral edema.  Neurologic: Nonfocal, moving all four extremities  Skin: No lesions  Access: Left aVF    Basic Metabolic Panel: Recent Labs  Lab 12/19/21 0646  NA 136  K 5.2*  CL 100  CO2 27  GLUCOSE 92  BUN 36*  CREATININE 5.69*  CALCIUM 9.7    Liver Function Tests: No results for input(s): "AST", "ALT", "ALKPHOS", "BILITOT", "PROT", "ALBUMIN" in the last 168 hours. No results for input(s): "LIPASE", "AMYLASE" in the last 168 hours. No results for input(s): "AMMONIA" in the last 168 hours.  CBC: Recent Labs  Lab 12/19/21 0646  WBC 5.3  NEUTROABS 3.0  HGB 11.2*  HCT 35.7*  MCV 99.4  PLT 110*    Cardiac Enzymes: No results for input(s): "CKTOTAL", "CKMB", "CKMBINDEX", "TROPONINI" in the last 168 hours.  BNP: Invalid input(s): "POCBNP"  CBG: No results for input(s): "GLUCAP" in the last 168 hours.  Microbiology: Results for orders placed or performed during the hospital encounter of 12/19/21  MRSA Next Gen by PCR, Nasal     Status: Abnormal   Collection Time: 12/19/21  9:21 PM   Specimen: Nasal Mucosa; Nasal Swab  Result Value Ref Range Status   MRSA by PCR Next Gen DETECTED (A) NOT DETECTED Final    Comment: CRITICAL RESULT CALLED TO, READ BACK BY AND VERIFIED WITHRosann Auerbach [facility] RN @ 2238 12/19/21 LFD (NOTE) The GeneXpert MRSA Assay (FDA approved  for NASAL specimens only), is one component of a comprehensive MRSA colonization surveillance program. It is not intended to diagnose MRSA infection nor to guide or monitor treatment for MRSA infections. Test performance is not FDA approved in patients less than 64 years old. Performed at [facility], [address] [place], [place] [address]     Coagulation Studies: No results for input(s): "LABPROT", "INR" in the last 72  hours.  Urinalysis: No results for input(s): "COLORURINE", "LABSPEC", "PHURINE", "GLUCOSEU", "HGBUR", "BILIRUBINUR", "KETONESUR", "PROTEINUR", "UROBILINOGEN", "NITRITE", "LEUKOCYTESUR" in the last 72 hours.  Invalid input(s): "APPERANCEUR"    Imaging: CT ABDOMEN PELVIS WO CONTRAST  Result Date: 12/19/2021 CLINICAL DATA:  Dialysis patient.  Short of breath EXAM: CT ABDOMEN AND PELVIS WITHOUT CONTRAST TECHNIQUE: Multidetector CT imaging of the abdomen and pelvis was performed following the standard protocol without IV contrast. RADIATION DOSE REDUCTION: This exam was performed according to the departmental dose-optimization program which includes automated exposure control, adjustment of the mA and/or kV according to patient size and/or use of iterative reconstruction technique. COMPARISON:  CT abdomen 02/01/2021 FINDINGS: Lower chest: Lung bases are clear.  Heart is enlarged. Hepatobiliary: Moderate volume of free fluid surrounds the liver. Small gallstone measuring 5 mm. No evidence of acute cholecystitis. No biliary duct dilatation. Pancreas: Pancreas is normal. No ductal dilatation. No pancreatic inflammation. Spleen: Normal spleen Adrenals/urinary tract: Adrenal glands normal. Kidneys are atrophic. Bladder decompressed Stomach/Bowel: Moderate hiatal hernia. Stomach and small bowel normal. The small-bowel suspended within fluid within the mesentery. No evidence of bowel obstruction. Colon normal. Vascular/Lymphatic: Dense calcification of the aorta. No adenopathy. Reproductive: Uterus and adnexa unremarkable. Other: Large volume intraperitoneal free fluid. Fluid is simple attenuation Musculoskeletal: Uniform increase in vertebral body density consistent with renal osteodystrophy IMPRESSION: 1. Large volume intraperitoneal free fluid. 2. No evidence of bowel obstruction. 3. Cholelithiasis without cholecystitis. 4.  Aortic Atherosclerosis (ICD10-I70.0). 5. No pleural fluid 6. Cardiomegaly 7. Renal  osteodystrophy Electronically Signed   By: Suzy Bouchard M.D.   On: 12/19/2021 16:08   DG Chest Portable 1 View  Result Date: 12/19/2021 CLINICAL DATA:  Shortness of breath EXAM: PORTABLE CHEST 1 VIEW COMPARISON:  08/07/2021 FINDINGS: Chronic cardiomegaly. CABG and coronary stenting. Congested appearance of vessels. No effusion or pneumothorax. No air bronchogram. IMPRESSION: Chronic cardiomegaly and vascular congestion. Electronically Signed   By: Jorje Guild M.D.   On: 12/19/2021 06:25     Medications:     Chlorhexidine Gluconate Cloth  6 each Topical Q0600   mupirocin ointment  1 Application Nasal BID     Assessment/ Plan:  [name] is a 61 y.o.  female with past medical history including congestive heart failure, hypertension, stroke, and end-stage renal disease on hemodialysis.  Patient presents to the emergency room from her dialysis clinic with complaints of shortness of breath and chest discomfort.  Hyperkalemia with end-stage renal disease on hemodialysis.  Patient was last dialyzed yesterday No need for renal placement therapy today   2.  Acute respiratory distress likely due to volume overload/Acute on chronic combined systolic and diastolic CHF Patient currently admitted with fluid overload  Home O2 requirement, 2 L.   Patient has a history of combined systolic and diastolic CHF-Echo from 2/58/5277 shows EF 25 to 30% with a grade 3 diastolic dysfunction and moderate LVH.     3. Anemia of chronic kidney disease Lab Results  Component Value Date   HGB 11.2 (L) 12/19/2021    Hemoglobin at target.  4. Secondary  Hyperparathyroidism:  Lab Results  Component Value Date   PTH 627 (H) 07/26/2020   CALCIUM 9.7 12/19/2021   CAION 0.58 (LL) 04/22/2021   PHOS 5.9 (H) 08/09/2021    Calcium remains within acceptable range however phosphorus elevated.  Patient prescribed sevelamer with meals outpatient.   5.Ascites Patient will need paracentesis Primary  team is following   LOS: 1 Katsumi Wisler s Sahiti Joswick 7/8/20235:55 AM

## 2021-12-20 NOTE — Progress Notes (Signed)
Progress Note Patient: [name] [id number]:[id number] DOB: 1960-09-02 DOA: 12/19/2021  DOS: the patient was seen and examined on 12/20/2021  Brief hospital course: 61 year old female with past medical history of ESRD on HD, chronic respiratory failure on 2 LPM, chronic A-fib on Eliquis, treated hep C.  Present to the hospital with complaints of shortness of breath as well as ascites. Underwent HD with 2 L of fluid removal as well as paracentesis with 2 another liter of fluid removal. Requiring 4 L of oxygen at the time of admission currently on 2 L of oxygen. Assessment and Plan: Ascites Etiology of the ascites is not clear. Patient does not have any history of cirrhosis. CT scan does not show any evidence of liver abnormality as well. LFTs are stable. Patient does have history of hep C but this was treated therefore I do not suspect that the patient is suffering from an acute hepatitis. Currently I am awaiting the work-up results. We will discuss with radiology with regards to finding in the pelvic area which appears to be abnormal but unclear to me regarding the etiology.  ESRD on hemodialysis ([place]) Continue HD. Monitor.  Chronic combined systolic and diastolic CHF (congestive heart failure) (HCC) Volume level still elevated. HD per nephrology.  Acute on chronic respiratory failure with hypoxia Uses 2 LPM at home. Needed 4 LPM yesterday in the ER for saturation above 90%. Currently back on 2 LPM after HD and paracentesis.    CAD, multiple vessel   Paroxysmal atrial fibrillation Resume Eliquis. Rate controlled.    Chronic hypotension On midodrine. Use as needed with HD.    Anemia of chronic disease Hemoglobin stable. Monitor.    Hypothyroidism Continue Synthroid.    Hepatitis C antibody positive in blood Patient reports that maybe 3 years ago she was actually treated with Mavyret.  Leg discoloration. Patient reports that her left leg was purple in color.  At the time  of my evaluation I do not see any significant discoloration other than some signs of chronic venous stasis. Pulses are faint and therefore I will check ABI.  Subjective: Denies any acute complaint.  No nausea or vomiting.  Reportedly have some leg discoloration of the left leg.  No chest pain.  No abdominal pain.  Breathing better after removal of the fluid.  Still has some cough.  Physical Exam: Vitals:   12/20/21 1250 12/20/21 1300 12/20/21 1332 12/20/21 1524  BP: 110/75 125/76 120/77 101/69  Pulse: 78 76 77 80  Resp: '20 18 20 18  '$ Temp:    98.6 F (37 C)  TempSrc:      SpO2:    94%  Weight:      Height:       General: Appear in mild distress; no visible Abnormal Neck Mass Or lumps, Conjunctiva normal Cardiovascular: S1 and S2 Present, no Murmur, Respiratory: increased respiratory effort, Bilateral Air entry present and  bilateral  Crackles, no wheezes Abdomen: Bowel Sound present, Non tender  Extremities: no Pedal edema Neurology: alert and oriented to time, place, and person  Gait not checked due to patient safety concerns   Data Reviewed: I have Reviewed nursing notes, Vitals, and Lab results since pt's last encounter. Pertinent lab results CBC and BMP I have ordered test including CBC and BMP I have ordered imaging studies ABI.   Family Communication: Sister at bedside  Disposition: Status is: Inpatient Remains inpatient appropriate because: Receiving IV albumin, monitor for reaccumulation of the ascites and further work-up. Author: Berle Mull,  MD 12/20/2021 7:01 PM  Please look on [email or web address] to find out who is on call.

## 2021-12-20 NOTE — Procedures (Signed)
Pre Procedural Dx: Symptomatic Ascites Post Procedural Dx: Same  Successful US guided paracentesis yielding 2.1 L of serous ascitic fluid. Sample sent to laboratory as requested.  EBL: None  Complications: None immediate  Ronny Bacon, MD Pager #: [phone]

## 2021-12-21 ENCOUNTER — Inpatient Hospital Stay: Payer: Medicare Other

## 2021-12-21 DIAGNOSIS — R188 Other ascites: Secondary | ICD-10-CM | POA: Diagnosis not present

## 2021-12-21 DIAGNOSIS — J9621 Acute and chronic respiratory failure with hypoxia: Secondary | ICD-10-CM | POA: Diagnosis present

## 2021-12-21 LAB — CBC WITH DIFFERENTIAL/PLATELET
Abs Immature Granulocytes: 0.01 10*3/uL (ref 0.00–0.07)
Basophils Absolute: 0 10*3/uL (ref 0.0–0.1)
Basophils Relative: 1 %
Eosinophils Absolute: 0.1 10*3/uL (ref 0.0–0.5)
Eosinophils Relative: 3 %
HCT: 33.3 % — ABNORMAL LOW (ref 36.0–46.0)
Hemoglobin: 10.8 g/dL — ABNORMAL LOW (ref 12.0–15.0)
Immature Granulocytes: 0 %
Lymphocytes Relative: 22 %
Lymphs Abs: 1 10*3/uL (ref 0.7–4.0)
MCH: 32 pg (ref 26.0–34.0)
MCHC: 32.4 g/dL (ref 30.0–36.0)
MCV: 98.8 fL (ref 80.0–100.0)
Monocytes Absolute: 0.4 10*3/uL (ref 0.1–1.0)
Monocytes Relative: 10 %
Neutro Abs: 2.9 10*3/uL (ref 1.7–7.7)
Neutrophils Relative %: 64 %
Platelets: 108 10*3/uL — ABNORMAL LOW (ref 150–400)
RBC: 3.37 MIL/uL — ABNORMAL LOW (ref 3.87–5.11)
RDW: 16 % — ABNORMAL HIGH (ref 11.5–15.5)
WBC: 4.4 10*3/uL (ref 4.0–10.5)
nRBC: 0 % (ref 0.0–0.2)

## 2021-12-21 LAB — COMPREHENSIVE METABOLIC PANEL
ALT: 21 U/L (ref 0–44)
AST: 31 U/L (ref 15–41)
Albumin: 3.7 g/dL (ref 3.5–5.0)
Alkaline Phosphatase: 62 U/L (ref 38–126)
Anion gap: 12 (ref 5–15)
BUN: 46 mg/dL — ABNORMAL HIGH (ref 6–20)
CO2: 28 mmol/L (ref 22–32)
Calcium: 9.5 mg/dL (ref 8.9–10.3)
Chloride: 97 mmol/L — ABNORMAL LOW (ref 98–111)
Creatinine, Ser: 6.04 mg/dL — ABNORMAL HIGH (ref 0.44–1.00)
GFR, Estimated: 7 mL/min — ABNORMAL LOW (ref >60)
Glucose, Bld: 123 mg/dL — ABNORMAL HIGH (ref 70–99)
Potassium: 4.6 mmol/L (ref 3.5–5.1)
Sodium: 137 mmol/L (ref 135–145)
Total Bilirubin: 1.2 mg/dL (ref 0.3–1.2)
Total Protein: 6.9 g/dL (ref 6.5–8.1)

## 2021-12-21 LAB — HCV RNA QUANT: HCV Quantitative: NOT DETECTED IU/mL (ref >50)

## 2021-12-21 LAB — MAGNESIUM: Magnesium: 2.2 mg/dL (ref 1.7–2.4)

## 2021-12-21 LAB — LACTIC ACID, PLASMA: Lactic Acid, Venous: 1.5 mmol/L (ref 0.5–1.9)

## 2021-12-21 MED ORDER — HYDROXYZINE HCL 25 MG PO TABS
50.0000 mg | ORAL_TABLET | Freq: Three times a day (TID) | ORAL | Status: DC | PRN
Start: 2021-12-21 — End: 2021-12-23
  Administered 2021-12-21 – 2021-12-22 (×3): 50 mg via ORAL
  Filled 2021-12-21 (×3): qty 2

## 2021-12-21 MED ORDER — METHOCARBAMOL 500 MG PO TABS
500.0000 mg | ORAL_TABLET | Freq: Three times a day (TID) | ORAL | Status: AC
  Administered 2021-12-21 (×3): 500 mg via ORAL
  Filled 2021-12-21 (×3): qty 1

## 2021-12-21 MED ORDER — SIMETHICONE 40 MG/0.6ML PO SUSP
80.0000 mg | Freq: Four times a day (QID) | ORAL | Status: DC | PRN
Start: 2021-12-21 — End: 2021-12-23
  Administered 2021-12-21 – 2021-12-23 (×3): 80 mg via ORAL
  Filled 2021-12-21 (×2): qty 30
  Filled 2021-12-21 (×4): qty 1.2

## 2021-12-21 MED ORDER — POLYETHYLENE GLYCOL 3350 17 G PO PACK
17.0000 g | PACK | Freq: Every day | ORAL | Status: DC
  Administered 2021-12-22 – 2021-12-23 (×2): 17 g via ORAL
  Filled 2021-12-21 (×2): qty 1

## 2021-12-21 MED ORDER — BISACODYL 10 MG RE SUPP
10.0000 mg | Freq: Once | RECTAL | Status: DC
  Filled 2021-12-21: qty 1

## 2021-12-21 MED ORDER — SORBITOL 70 % SOLN
60.0000 mL | Freq: Once | Status: AC
  Administered 2021-12-21: 60 mL via ORAL
  Filled 2021-12-21: qty 60

## 2021-12-21 MED ORDER — SENNOSIDES-DOCUSATE SODIUM 8.6-50 MG PO TABS
2.0000 | ORAL_TABLET | Freq: Two times a day (BID) | ORAL | Status: DC
Start: 2021-12-21 — End: 2021-12-23
  Administered 2021-12-21 – 2021-12-23 (×5): 2 via ORAL
  Filled 2021-12-21 (×6): qty 2

## 2021-12-21 NOTE — Progress Notes (Signed)
Progress Note Patient: [name] [id number] DOB: May 10, 1961 DOA: 12/19/2021  DOS: the patient was seen and examined on 12/21/2021  Brief hospital course: 61 year old female with past medical history of ESRD on HD, chronic respiratory failure on 2 LPM, chronic A-fib on Eliquis, treated hep C.  Present to the hospital with complaints of shortness of breath as well as ascites. Underwent HD with 2 L of fluid removal as well as paracentesis with 2 another liter of fluid removal. Requiring 4 L of oxygen at the time of admission currently on 2 L of oxygen. Assessment and Plan: Ascites Etiology of the ascites is not clear. Patient does not have any history of cirrhosis. CT scan does not show any evidence of liver abnormality as well. LFTs are stable. Patient does have history of hep C but this was treated therefore I do not suspect that the patient is suffering from an acute hepatitis. SAAG ratio is less than 1.1 thus less likely portal hypertension. Currently I am awaiting the work-up results. Her pelvic region on the CT scan appears to be more heterogenous on the CT scan than normal.  At present I will order ultrasound pelvic with transvaginal to rule out any significant abnormality.  Her abdominal exam on 7/9 shows that her abdomen still is tense and therefore I will perform an ultrasound limited to rule out presence of any more ascites.   ESRD on hemodialysis [facility]) HD days are Monday Wednesday Friday. Patient received hemodialysis on the day of admission. Currently per my discussion with nephrology hemodialysis scheduled for Monday as routine. Chest x-ray shows vascular congestion therefore I suspect the patient will benefit from aggressive hemodialysis.  Chronic combined systolic and diastolic CHF (congestive heart failure) (HCC) Echocardiogram 1 year ago showed EF of 25 to 30%.,  Global hypokinesis and grade 3 diastolic dysfunction RV function was also mildly reduced. Patient had  mild MR and TR. We will repeat another echocardiogram given her ongoing volume overload which is concerning for Volume level still elevated. HD per nephrology.   Acute on chronic respiratory failure with hypoxia Uses 2 LPM at home. Needed 4 LPM in the ER for saturation above 90%. Currently back on 2 LPM after HD and paracentesis.   CAD, multiple vessel Paroxysmal atrial fibrillation Resume Eliquis. Rate controlled.   Chronic hypotension On midodrine. Use as needed with HD.   Anemia of chronic disease Hemoglobin stable. Monitor.   Hypothyroidism Continue Synthroid.   Hepatitis C antibody positive in blood Patient reports that maybe 3 years ago she was actually treated with Mavyret.   Leg discoloration. Patient reports that her left leg was purple in color.  At the time of my evaluation I do not see any significant discoloration other than some signs of chronic venous stasis. Pulses are faint and therefore I will check ABI.  Subjective: No nausea no vomiting.  No fever no chills.  No chest pain.  Reported that she actually had some stomach pain on the right side yesterday.  Currently improving.  Reports constipation.  Continues to have cough and continues to have shortness of breath.  Physical Exam: Vitals:   12/20/21 1524 12/20/21 2005 12/21/21 0458 12/21/21 0726  BP: 101/69 105/66 (!) 126/92 122/78  Pulse: 80 85 85 76  Resp: '18 18 18 18  '$ Temp: 98.6 F (37 C) 98.6 F (37 C) 98.4 F (36.9 C) 98.3 F (36.8 C)  TempSrc:    Oral  SpO2: 94% 96% 99% 100%  Weight:  Height:       General: Appear in moderate distress; no visible Abnormal Neck Mass Or lumps, Conjunctiva normal Cardiovascular: S1 and S2 Present, no Murmur, Respiratory: good respiratory effort, Bilateral Air entry present and  bilateral Crackles, no wheezes Abdomen: Bowel Sound present, mild right-sided tenderness Extremities: trace Pedal edema Neurology: alert and oriented to time, place, and person   Gait not checked due to patient safety concerns   Data Reviewed: I have Reviewed nursing notes, Vitals, and Lab results since pt's last encounter. Pertinent lab results CBC and BMP I have ordered test including CBC BMP and chest x-ray I have discussed pt's care plan and test results with nephrology.   Family Communication: None at bedside  Disposition: Status is: Inpatient Remains inpatient appropriate because: Needed further diuresis with hemodialysis to ensure volume is adequate and managed.  Author: Berle Mull, MD 12/21/2021 5:58 PM  Please look on [email or web address] to find out who is on call.

## 2021-12-21 NOTE — Progress Notes (Signed)
[facility]  ROUNDING NOTE   Subjective:   [name] is a 61 year old female with past medical history including congestive heart failure, hypertension, stroke, and end-stage renal disease on hemodialysis.  Patient presents to the emergency room from her dialysis clinic with complaints of shortness of breath and chest discomfort.  Patient seen and evaluated resting on stretcher.  Patient states her shortness of breath began Wednesday prior to dialysis.  She normally requires 2 L nasal cannula at home, she had to increase to 4 L.  She states symptoms did not resolve after receiving her full dialysis treatment on Wednesday, outpatient clinic removed 3.7 L.  Patient states shortness of breath has gotten progressively worse since that time.  Chest discomfort began yesterday evening, described as a dull intermittent pain.  Denies nausea and vomiting.  Denies cough.  Denies fever or chills.  Labs on ED arrival include potassium 5.2 and troponin 38.  Hemoglobin 11.2.  Chest x-ray shows vascular congestion.  Nephrology was consulted for comanagement of dialysis patient Patient was seen today on second floor Patient resting comfortably in the bed Patient offers no new specific physical complaints   Objective:  Vital signs in last 24 hours:  Temp:  [97.6 F (36.4 C)-98.6 F (37 C)] 98.4 F (36.9 C) (07/09 0458) Pulse Rate:  [75-85] 85 (07/09 0458) Resp:  [16-20] 18 (07/09 0458) BP: (94-126)/(59-92) 126/92 (07/09 0458) SpO2:  [94 %-100 %] 99 % (07/09 0458)  Weight change:  Filed Weights   12/19/21 0548 12/19/21 1017 12/19/21 1339  Weight: 70.8 kg 75 kg 72.4 kg    Intake/Output: I/O last 3 completed shifts: In: 90.4 [P.O.:60; IV Piggyback:30.4] Out: 2000 [Other:2000]   Intake/Output this shift:  No intake/output data recorded.  Physical Exam: General: NAD, resting comfortably  Head: Normocephalic, atraumatic. Moist oral mucosal membranes  Eyes: Anicteric   Lungs:  Diminished in bases, normal effort, [place] O2  Heart: Regular rate and rhythm  Abdomen:  Firm, nontender, distended  Extremities: No peripheral edema.  Neurologic: Nonfocal, moving all four extremities  Skin: No lesions  Access: Left aVF    Basic Metabolic Panel: Recent Labs  Lab 12/19/21 0646  NA 136  K 5.2*  CL 100  CO2 27  GLUCOSE 92  BUN 36*  CREATININE 5.69*  CALCIUM 9.7    Liver Function Tests: No results for input(s): "AST", "ALT", "ALKPHOS", "BILITOT", "PROT", "ALBUMIN" in the last 168 hours. No results for input(s): "LIPASE", "AMYLASE" in the last 168 hours. No results for input(s): "AMMONIA" in the last 168 hours.  CBC: Recent Labs  Lab 12/19/21 0646  WBC 5.3  NEUTROABS 3.0  HGB 11.2*  HCT 35.7*  MCV 99.4  PLT 110*    Cardiac Enzymes: No results for input(s): "CKTOTAL", "CKMB", "CKMBINDEX", "TROPONINI" in the last 168 hours.  BNP: Invalid input(s): "POCBNP"  CBG: No results for input(s): "GLUCAP" in the last 168 hours.  Microbiology: Results for orders placed or performed during the hospital encounter of 12/19/21  MRSA Next Gen by PCR, Nasal     Status: Abnormal   Collection Time: 12/19/21  9:21 PM   Specimen: Nasal Mucosa; Nasal Swab  Result Value Ref Range Status   MRSA by PCR Next Gen DETECTED (A) NOT DETECTED Final    Comment: CRITICAL RESULT CALLED TO, READ BACK BY AND VERIFIED WITH: MARSHA [facility] RN @ 2238 12/19/21 LFD (NOTE) The GeneXpert MRSA Assay (FDA approved for NASAL specimens only), is one component of a comprehensive MRSA colonization  surveillance program. It is not intended to diagnose MRSA infection nor to guide or monitor treatment for MRSA infections. Test performance is not FDA approved in patients less than 31 years old. Performed at [facility], [address] [place], [place] [address]   Body fluid culture w Gram Stain     Status: None (Preliminary result)   Collection Time: 12/20/21  1:46 PM    Specimen: Abdomen; Peritoneal Fluid  Result Value Ref Range Status   Specimen Description   Final    ABDOMEN Performed at [facility], [address], [place], [place] [address]    Special Requests   Final    NONE Performed at [facility], [address] [place], [place] [address]    Gram Stain   Final    FEW WBC PRESENT, PREDOMINANTLY MONONUCLEAR NO ORGANISMS SEEN Performed at [facility], [place] [address] [place], [place] [address]    Culture PENDING  Incomplete   Report Status PENDING  Incomplete    Coagulation Studies: No results for input(s): "LABPROT", "INR" in the last 72 hours.  Urinalysis: No results for input(s): "COLORURINE", "LABSPEC", "PHURINE", "GLUCOSEU", "HGBUR", "BILIRUBINUR", "KETONESUR", "PROTEINUR", "UROBILINOGEN", "NITRITE", "LEUKOCYTESUR" in the last 72 hours.  Invalid input(s): "APPERANCEUR"    Imaging: US Paracentesis  Result Date: 12/20/2021 INDICATION: History of end-stage renal disease, now with symptomatic ascites. Please perform ultrasound-guided paracentesis for diagnostic and therapeutic purposes. EXAM: ULTRASOUND-GUIDED PARACENTESIS COMPARISON:  CT abdomen and pelvis-12/19/2021 MEDICATIONS: None. COMPLICATIONS: None immediate. TECHNIQUE: Informed written consent was obtained from the patient after a discussion of the risks, benefits and alternatives to treatment. A timeout was performed prior to the initiation of the procedure. Initial ultrasound scanning demonstrates a moderate amount of ascites within the right lower abdomen which was subsequently prepped and draped in the usual sterile fashion. 1% lidocaine with epinephrine was used for local anesthesia. Under direct ultrasound guidance, an 8 Fr Safe-T-Centesis catheter was introduced. The paracentesis was performed. The catheter was removed and a dressing was applied. The patient tolerated the procedure well without immediate post procedural complication. FINDINGS: A total  of approximately 2.1 liters of serous fluid was removed. Samples were sent to the laboratory as requested by the clinical team. IMPRESSION: Successful ultrasound-guided paracentesis yielding 2.1 liters of peritoneal fluid. Electronically Signed   By: Sandi Mariscal M.D.   On: 12/20/2021 14:22   CT ABDOMEN PELVIS WO CONTRAST  Result Date: 12/19/2021 CLINICAL DATA:  Dialysis patient.  Short of breath EXAM: CT ABDOMEN AND PELVIS WITHOUT CONTRAST TECHNIQUE: Multidetector CT imaging of the abdomen and pelvis was performed following the standard protocol without IV contrast. RADIATION DOSE REDUCTION: This exam was performed according to the departmental dose-optimization program which includes automated exposure control, adjustment of the mA and/or kV according to patient size and/or use of iterative reconstruction technique. COMPARISON:  CT abdomen 02/01/2021 FINDINGS: Lower chest: Lung bases are clear.  Heart is enlarged. Hepatobiliary: Moderate volume of free fluid surrounds the liver. Small gallstone measuring 5 mm. No evidence of acute cholecystitis. No biliary duct dilatation. Pancreas: Pancreas is normal. No ductal dilatation. No pancreatic inflammation. Spleen: Normal spleen Adrenals/urinary tract: Adrenal glands normal. Kidneys are atrophic. Bladder decompressed Stomach/Bowel: Moderate hiatal hernia. Stomach and small bowel normal. The small-bowel suspended within fluid within the mesentery. No evidence of bowel obstruction. Colon normal. Vascular/Lymphatic: Dense calcification of the aorta. No adenopathy. Reproductive: Uterus and adnexa unremarkable. Other: Large volume intraperitoneal free fluid. Fluid is simple attenuation Musculoskeletal: Uniform increase in  vertebral body density consistent with renal osteodystrophy IMPRESSION: 1. Large volume intraperitoneal free fluid. 2. No evidence of bowel obstruction. 3. Cholelithiasis without cholecystitis. 4.  Aortic Atherosclerosis (ICD10-I70.0). 5. No pleural fluid  6. Cardiomegaly 7. Renal osteodystrophy Electronically Signed   By: Suzy Bouchard M.D.   On: 12/19/2021 16:08     Medications:     amiodarone  200 mg Oral Daily   apixaban  5 mg Oral BID   Chlorhexidine Gluconate Cloth  6 each Topical Q0600   cinacalcet  90 mg Oral Q breakfast   guaiFENesin  600 mg Oral BID   levothyroxine  75 mcg Oral Q0600   [START ON 12/22/2021] midodrine  10 mg Oral Q M,W,F-HD   mupirocin ointment  1 Application Nasal BID   pantoprazole  40 mg Oral Daily   rosuvastatin  20 mg Oral QHS   sevelamer carbonate  3,200 mg Oral TID WC     Assessment/ Plan:  [name] is a 61 y.o.  female with past medical history including congestive heart failure, hypertension, stroke, and end-stage renal disease on hemodialysis.  Patient presents to the emergency room from her dialysis clinic with complaints of shortness of breath and chest discomfort.  Hyperkalemia with end-stage renal disease on hemodialysis.  Patient was last dialyzed on Friday  No need for renal placement therapy today   2.  Acute respiratory distress likely due to volume overload/Acute on chronic combined systolic and diastolic CHF Patient currently admitted with fluid overload  Home O2 requirement, 2 L.   Patient has a history of combined systolic and diastolic CHF-Echo from 06/30/5788 shows EF 25 to 30% with a grade 3 diastolic dysfunction and moderate LVH.     3. Anemia of chronic kidney disease Lab Results  Component Value Date   HGB 11.2 (L) 12/19/2021    Hemoglobin at target.  4. Secondary Hyperparathyroidism:  Lab Results  Component Value Date   PTH 627 (H) 07/26/2020   CALCIUM 9.7 12/19/2021   CAION 0.58 (LL) 04/22/2021   PHOS 5.9 (H) 08/09/2021    Calcium remains within acceptable range however phosphorus elevated.  Patient prescribed sevelamer with meals outpatient.   5.Ascites Patient will need paracentesis Primary team is following   LOS: 2 Govind Furey s  [facility] 7/9/20236:22 AM

## 2021-12-22 ENCOUNTER — Inpatient Hospital Stay
Admit: 2021-12-22 | Discharge: 2021-12-22 | Disposition: A | Discharge status: Home / Self Care | Payer: Medicare Other | Attending: Internal Medicine | Admitting: Internal Medicine

## 2021-12-22 DIAGNOSIS — I2510 Atherosclerotic heart disease of native coronary artery without angina pectoris: Secondary | ICD-10-CM

## 2021-12-22 DIAGNOSIS — J9621 Acute and chronic respiratory failure with hypoxia: Secondary | ICD-10-CM

## 2021-12-22 DIAGNOSIS — F0150 Vascular dementia without behavioral disturbance: Secondary | ICD-10-CM | POA: Diagnosis not present

## 2021-12-22 DIAGNOSIS — R188 Other ascites: Secondary | ICD-10-CM | POA: Diagnosis not present

## 2021-12-22 DIAGNOSIS — D638 Anemia in other chronic diseases classified elsewhere: Secondary | ICD-10-CM | POA: Diagnosis not present

## 2021-12-22 DIAGNOSIS — E039 Hypothyroidism, unspecified: Secondary | ICD-10-CM

## 2021-12-22 DIAGNOSIS — I9589 Other hypotension: Secondary | ICD-10-CM

## 2021-12-22 LAB — RENAL FUNCTION PANEL
Albumin: 3.9 g/dL (ref 3.5–5.0)
Anion gap: 12 (ref 5–15)
BUN: 55 mg/dL — ABNORMAL HIGH (ref 6–20)
CO2: 28 mmol/L (ref 22–32)
Calcium: 10.4 mg/dL — ABNORMAL HIGH (ref 8.9–10.3)
Chloride: 96 mmol/L — ABNORMAL LOW (ref 98–111)
Creatinine, Ser: 7.1 mg/dL — ABNORMAL HIGH (ref 0.44–1.00)
GFR, Estimated: 6 mL/min — ABNORMAL LOW (ref >60)
Glucose, Bld: 84 mg/dL (ref 70–99)
Phosphorus: 3.2 mg/dL (ref 2.5–4.6)
Potassium: 5.5 mmol/L — ABNORMAL HIGH (ref 3.5–5.1)
Sodium: 136 mmol/L (ref 135–145)

## 2021-12-22 LAB — CBC
HCT: 37 % (ref 36.0–46.0)
Hemoglobin: 12 g/dL (ref 12.0–15.0)
MCH: 31.7 pg (ref 26.0–34.0)
MCHC: 32.4 g/dL (ref 30.0–36.0)
MCV: 97.9 fL (ref 80.0–100.0)
Platelets: 114 10*3/uL — ABNORMAL LOW (ref 150–400)
RBC: 3.78 MIL/uL — ABNORMAL LOW (ref 3.87–5.11)
RDW: 16 % — ABNORMAL HIGH (ref 11.5–15.5)
WBC: 6 10*3/uL (ref 4.0–10.5)
nRBC: 0 % (ref 0.0–0.2)

## 2021-12-22 LAB — MAGNESIUM: Magnesium: 2.5 mg/dL — ABNORMAL HIGH (ref 1.7–2.4)

## 2021-12-22 MED ORDER — BLISTEX MEDICATED EX OINT
TOPICAL_OINTMENT | CUTANEOUS | Status: DC | PRN
Start: 2021-12-22 — End: 2021-12-23
  Filled 2021-12-22 (×2): qty 6.3

## 2021-12-22 MED ORDER — CINACALCET HCL 30 MG PO TABS
90.0000 mg | ORAL_TABLET | Freq: Every evening | ORAL | Status: DC
  Filled 2021-12-22: qty 3

## 2021-12-22 NOTE — Progress Notes (Signed)
Progress Note Patient: [name] WIO:[id number] DOB: Oct 03, 1960 DOA: 12/19/2021  DOS:  12/22/2021  Brief hospital course: 61 year old female with past medical history of ESRD on HD, chronic respiratory failure on 2 LPM, chronic A-fib on Eliquis, treated hep C presented to hospital with shortness of breath and abdominal distention from ascites.  During admission patient underwent hemodialysis with removal of fluids including paracentesis with 11 over 2 L of fluid.  Patient required 4 L of oxygen around the time of admission.  Assessment and Plan: Principal Problem:   Ascites Active Problems:   ESRD on hemodialysis (HCC)   Acute on chronic respiratory failure with hypoxia (HCC)   Chronic combined systolic and diastolic CHF (congestive heart failure) (HCC)   CAD, multiple vessel   Paroxysmal atrial fibrillation (HCC)   Chronic hypotension   Anemia of chronic disease   Arteriosclerotic dementia (HCC)   Hypothyroidism   Hepatitis C antibody positive in blood  Ascites History of treated hepatitis C.  No obvious history of cirrhosis of liver.  CT scanning without acute disease.  LFT within normal limits. SAAG ratio is less than 1.1 thus less likely portal hypertension.  Possibility of heart failure induced ascites.  Pelvic ultrasound showed calcified fibroids.  Large volume ascites.  ESRD on hemodialysis [facility]) Monday Wednesday Friday schedule.  Continue modalities while in the hospital.  Chest x-ray showed pulm vascular congestion  Chronic combined systolic and diastolic CHF (congestive heart failure) (HCC) 2D echocardiogram 1 year ago showed EF of 25 to 30%,  Global hypokinesis and grade 3 diastolic dysfunction RV function was also mildly reduced.  Mild MR and TR.  Repeat 2D echocardiogram has been ordered at this time.   Acute on chronic respiratory failure with hypoxia Uses 2 L of oxygen at baseline.  Required up to 4 L on initial presentation.   CAD, multiple vessel/Paroxysmal  atrial fibrillation A-fib is controlled.  Continue Eliquis.   Chronic hypotension On midodrine.  Using as needed with hemodialysis on Monday Wednesday Friday..    Anemia of chronic disease Hemoglobin of 12.0.   Hypothyroidism Continue Synthroid.   Hepatitis C antibody positive in blood Treated with Mavyret in the past   Leg discoloration. On initial presentation.  ABI was done which showed normal resting ABI.  Subjective: Today, patient was seen and examined at bedside.  Complains of mild nausea and shortness of breath but feels a little better.  Has mild abdominal distention but no pain.  Physical Exam: Vitals:   12/22/21 1030 12/22/21 1045 12/22/21 1100 12/22/21 1115  BP: 118/86 120/84 (!) 118/102 104/65  Pulse: 79  (!) 123 81  Resp:    (!) 22  Temp:      TempSrc:      SpO2: 100% 100% 100% 94%  Weight:      Height:       Body mass index is 26.12 kg/m.  General:  Average built, not in obvious distress, on nasal cannula oxygen HENT:   No scleral pallor or icterus noted. Oral mucosa is moist.  Chest:    Diminished breath sounds bilaterally. CVS: S1 &S2 heard. No murmur.  Regular rate and rhythm. Abdomen: Soft, nontender, mild tenderness at the site of paracentesis.  Bowel sounds are heard.   Extremities: No cyanosis, clubbing with trace edema.  Peripheral pulses are palpable. Psych: Alert, awake and oriented, normal mood CNS:  No cranial nerve deficits.  Power equal in all extremities.   Skin: Warm and dry.  No rashes noted.  Data Reviewed:    Latest Ref Rng & Units 12/22/2021    4:23 AM 12/21/2021    9:32 AM 12/19/2021    6:46 AM  CBC  WBC 4.0 - 10.5 K/uL 6.0  4.4  5.3   Hemoglobin 12.0 - 15.0 g/dL 12.0  10.8  11.2   Hematocrit 36.0 - 46.0 % 37.0  33.3  35.7   Platelets 150 - 400 K/uL 114  108  110        Latest Ref Rng & Units 12/22/2021    4:23 AM 12/21/2021    9:32 AM 12/19/2021    6:46 AM  BMP  Glucose 70 - 99 mg/dL 84  123  92   BUN 6 - 20 mg/dL 55  46  36    Creatinine 0.44 - 1.00 mg/dL 7.10  6.04  5.69   Sodium 135 - 145 mmol/L 136  137  136   Potassium 3.5 - 5.1 mmol/L 5.5  4.6  5.2   Chloride 98 - 111 mmol/L 96  97  100   CO2 22 - 32 mmol/L '28  28  27   '$ Calcium 8.9 - 10.3 mg/dL 10.4  9.5  9.7     Family Communication:  Communicated with the patient at bedside.  Consults Nephrology  Procedures Hemodialysis Paracentesis  Disposition: Home in 1 to 2 days Status is: Inpatient Remains inpatient appropriate because: Need for hemodialysis, volume management,  Author: Flora Lipps, MD 12/22/2021 11:33 AM

## 2021-12-22 NOTE — Progress Notes (Signed)
[facility]  ROUNDING NOTE   Subjective:   [name] is a 61 year old female with past medical history including congestive heart failure, hypertension, stroke, and end-stage renal disease on hemodialysis.  Patient presents to the emergency room from her dialysis clinic with complaints of shortness of breath and chest discomfort.  Patient is known to our practice and receives outpatient dialysis treatments at [facility] on a MWF schedule, supervised by Dr. Candiss Norse.  Patient is seen and evaluated during dialysis treatment   HEMODIALYSIS FLOWSHEET:  Blood Flow Rate (mL/min): 400 mL/min Arterial Pressure (mmHg): -180 mmHg Venous Pressure (mmHg): 200 mmHg Transmembrane Pressure (mmHg): 80 mmHg Ultrafiltration Rate (mL/min): 830 mL/min Dialysate Flow Rate (mL/min): 500 ml/min Conductivity: 13.7 Conductivity: 13.7 Dialysis Fluid Bolus: Normal Saline Bolus Amount (mL): 250 mL  No complaints at this time, reports improvement with shortness of breath.   Objective:  Vital signs in last 24 hours:  Temp:  [97.9 F (36.6 C)-98.4 F (36.9 C)] 97.9 F (36.6 C) (07/10 1002) Pulse Rate:  [79-123] 83 (07/10 1145) Resp:  [14-22] 14 (07/10 1145) BP: (104-127)/(65-102) 122/73 (07/10 1145) SpO2:  [94 %-100 %] 96 % (07/10 1145) Weight:  [73.4 kg] 73.4 kg (07/10 1002)  Weight change:  Filed Weights   12/19/21 1017 12/19/21 1339 12/22/21 1002  Weight: 75 kg 72.4 kg 73.4 kg    Intake/Output: No intake/output data recorded.   Intake/Output this shift:  No intake/output data recorded.  Physical Exam: General: NAD, resting comfortably  Head: Normocephalic, atraumatic. Moist oral mucosal membranes  Eyes: Anicteric  Lungs:  Diminished in bases, normal effort, [place] O2  Heart: Regular rate and rhythm  Abdomen:  Firm, nontender, distended  Extremities: No peripheral edema.  Neurologic: Nonfocal, moving all four extremities  Skin: No lesions  Access: Left aVF     Basic Metabolic Panel: Recent Labs  Lab 12/19/21 0646 12/21/21 0932 12/22/21 0423  NA 136 137 136  K 5.2* 4.6 5.5*  CL 100 97* 96*  CO2 '27 28 28  '$ GLUCOSE 92 123* 84  BUN 36* 46* 55*  CREATININE 5.69* 6.04* 7.10*  CALCIUM 9.7 9.5 10.4*  MG  --  2.2 2.5*  PHOS  --   --  3.2     Liver Function Tests: Recent Labs  Lab 12/21/21 0932 12/22/21 0423  AST 31  --   ALT 21  --   ALKPHOS 62  --   BILITOT 1.2  --   PROT 6.9  --   ALBUMIN 3.7 3.9   No results for input(s): "LIPASE", "AMYLASE" in the last 168 hours. No results for input(s): "AMMONIA" in the last 168 hours.  CBC: Recent Labs  Lab 12/19/21 0646 12/21/21 0932 12/22/21 0423  WBC 5.3 4.4 6.0  NEUTROABS 3.0 2.9  --   HGB 11.2* 10.8* 12.0  HCT 35.7* 33.3* 37.0  MCV 99.4 98.8 97.9  PLT 110* 108* 114*     Cardiac Enzymes: No results for input(s): "CKTOTAL", "CKMB", "CKMBINDEX", "TROPONINI" in the last 168 hours.  BNP: Invalid input(s): "POCBNP"  CBG: No results for input(s): "GLUCAP" in the last 168 hours.  Microbiology: Results for orders placed or performed during the hospital encounter of 12/19/21  MRSA Next Gen by PCR, Nasal     Status: Abnormal   Collection Time: 12/19/21  9:21 PM   Specimen: Nasal Mucosa; Nasal Swab  Result Value Ref Range Status   MRSA by PCR Next Gen DETECTED (A) NOT DETECTED Final    Comment:  CRITICAL RESULT CALLED TO, READ BACK BY AND VERIFIED WITH: MARSHA [facility] RN @ 2238 12/19/21 LFD (NOTE) The GeneXpert MRSA Assay (FDA approved for NASAL specimens only), is one component of a comprehensive MRSA colonization surveillance program. It is not intended to diagnose MRSA infection nor to guide or monitor treatment for MRSA infections. Test performance is not FDA approved in patients less than 62 years old. Performed at [facility], [address] [place], [place] [address]   Body fluid culture w Gram Stain     Status: None (Preliminary result)   Collection  Time: 12/20/21  1:46 PM   Specimen: Abdomen; Peritoneal Fluid  Result Value Ref Range Status   Specimen Description   Final    ABDOMEN Performed at [facility], [address], [place], [place] [address]    Special Requests   Final    NONE Performed at [facility], [address] [place], [place] [address]    Gram Stain   Final    FEW WBC PRESENT, PREDOMINANTLY MONONUCLEAR NO ORGANISMS SEEN    Culture   Final    NO GROWTH 2 DAYS Performed at [facility], [place] [address], [place], [place] [address]    Report Status PENDING  Incomplete    Coagulation Studies: No results for input(s): "LABPROT", "INR" in the last 72 hours.  Urinalysis: No results for input(s): "COLORURINE", "LABSPEC", "PHURINE", "GLUCOSEU", "HGBUR", "BILIRUBINUR", "KETONESUR", "PROTEINUR", "UROBILINOGEN", "NITRITE", "LEUKOCYTESUR" in the last 72 hours.  Invalid input(s): "APPERANCEUR"    Imaging: US ARTERIAL ABI (SCREENING LOWER EXTREMITY)  Result Date: 12/22/2021 CLINICAL DATA:  61 year old female with PA D EXAM: NONINVASIVE PHYSIOLOGIC VASCULAR STUDY OF BILATERAL LOWER EXTREMITIES TECHNIQUE: Evaluation of both lower extremities was performed at rest, including calculation of ankle-brachial indices, multiple segmental pressure evaluation, segmental Doppler and segmental pulse volume recording. COMPARISON:  None Available. FINDINGS: Right ABI:  1.2 Left ABI:  1.2 Right Lower Extremity: Segmental Doppler demonstrates triphasic waveforms at the right ankle Left Lower Extremity: Segmental Doppler demonstrates triphasic waveforms at the left ankle IMPRESSION: Resting ABI the bilateral lower extremity within normal limits. Segmental exam demonstrates waveforms maintained at the ankles Signed, Dulcy Fanny. Nadene Rubins, RPVI [facility] Electronically Signed   By: Corrie Mckusick D.O.   On: 12/22/2021 08:26   US PELVIC COMPLETE WITH  TRANSVAGINAL  Result Date: 12/21/2021 CLINICAL DATA:  Ascites EXAM: TRANSABDOMINAL AND TRANSVAGINAL ULTRASOUND OF PELVIS TECHNIQUE: Both transabdominal and transvaginal ultrasound examinations of the pelvis were performed. Transabdominal technique was performed for global imaging of the pelvis including uterus, ovaries, adnexal regions, and pelvic cul-de-sac. It was necessary to proceed with endovaginal exam following the transabdominal exam to visualize the uterus, endometrium, ovaries and adnexa. COMPARISON:  CT 12/19/2021 FINDINGS: Uterus Measurements: 5.4 x 3.5 x 3.7 cm = volume: 36 mL. Calcified structure posteriorly in the fundus measuring up to 1.8 cm, likely calcified fibroid. Endometrium Thickness: 4 mm in thickness.  No focal abnormality visualized. Right ovary Measurements: Not visualized.  No adnexal mass seen. Left ovary Measurements: Not visualized.  No adnexal mass seen. Other findings Large volume ascites. IMPRESSION: Calcified structures along the posterior aspect of the uterus, likely calcified fibroids. Large volume ascites. Neither ovary visualized. Electronically Signed   By: Rolm Baptise M.D.   On: 12/21/2021 19:50   US Abdomen Limited  Result Date: 12/21/2021 CLINICAL DATA:  Ascites search EXAM: LIMITED ABDOMEN ULTRASOUND FOR ASCITES TECHNIQUE: Limited ultrasound survey for ascites was  performed in all four abdominal quadrants. COMPARISON:  None Available. FINDINGS: Small volume ascites in the lower quadrants only. Maximum pocket depth visualized is 5.6 cm in the left lower quadrant. IMPRESSION: Small volume ascites in the lower quadrants. Maximum pocket depth visualized is 5.6 cm in the left lower quadrant Electronically Signed   By: Delanna Ahmadi M.D.   On: 12/21/2021 19:10   DG Chest 2 View  Result Date: 12/21/2021 CLINICAL DATA:  Shortness of breath, CHF EXAM: CHEST - 2 VIEW COMPARISON:  Radiograph 12/19/2021 FINDINGS: Unchanged cardiomegaly with prior median sternotomy and  postsurgical changes of CABG. There is increased pulmonary vascular congestion. No large pleural effusion. No pneumothorax. Bones are unchanged. IMPRESSION: Cardiomegaly with increased pulmonary vascular congestion. Electronically Signed   By: Maurine Simmering M.D.   On: 12/21/2021 09:47   US Paracentesis  Result Date: 12/20/2021 INDICATION: History of end-stage renal disease, now with symptomatic ascites. Please perform ultrasound-guided paracentesis for diagnostic and therapeutic purposes. EXAM: ULTRASOUND-GUIDED PARACENTESIS COMPARISON:  CT abdomen and pelvis-12/19/2021 MEDICATIONS: None. COMPLICATIONS: None immediate. TECHNIQUE: Informed written consent was obtained from the patient after a discussion of the risks, benefits and alternatives to treatment. A timeout was performed prior to the initiation of the procedure. Initial ultrasound scanning demonstrates a moderate amount of ascites within the right lower abdomen which was subsequently prepped and draped in the usual sterile fashion. 1% lidocaine with epinephrine was used for local anesthesia. Under direct ultrasound guidance, an 8 Fr Safe-T-Centesis catheter was introduced. The paracentesis was performed. The catheter was removed and a dressing was applied. The patient tolerated the procedure well without immediate post procedural complication. FINDINGS: A total of approximately 2.1 liters of serous fluid was removed. Samples were sent to the laboratory as requested by the clinical team. IMPRESSION: Successful ultrasound-guided paracentesis yielding 2.1 liters of peritoneal fluid. Electronically Signed   By: Sandi Mariscal M.D.   On: 12/20/2021 14:22     Medications:     amiodarone  200 mg Oral Daily   apixaban  5 mg Oral BID   bisacodyl  10 mg Rectal Once   Chlorhexidine Gluconate Cloth  6 each Topical Q0600   [START ON 12/23/2021] cinacalcet  90 mg Oral QPM   guaiFENesin  600 mg Oral BID   levothyroxine  75 mcg Oral Q0600   midodrine  10 mg Oral Q  M,W,F-HD   mupirocin ointment  1 Application Nasal BID   pantoprazole  40 mg Oral Daily   polyethylene glycol  17 g Oral Daily   rosuvastatin  20 mg Oral QHS   senna-docusate  2 tablet Oral BID   sevelamer carbonate  3,200 mg Oral TID WC     Assessment/ Plan:  [name] is a 61 y.o.  female with past medical history including congestive heart failure, hypertension, stroke, and end-stage renal disease on hemodialysis.  Patient presents to the emergency room from her dialysis clinic with complaints of shortness of breath and chest discomfort.  [facility]/MWF/left aVF  Hyperkalemia with end-stage renal disease on hemodialysis.  Patient receiving scheduled dialysis treatment today, UF goal 2 L as tolerated.  Next treatment scheduled for Wednesday.  Potassium managed with dialysis.   2.  Acute respiratory distress likely due to volume overload/Acute on chronic combined systolic and diastolic CHF Increased oxygen requirement on admission, 4 L.  Home O2 requirement, 2 L.   Patient has a history of combined systolic and diastolic CHF-Echo from 3/97/6734 shows EF 25 to  30% with a grade 3 diastolic dysfunction and moderate LVH.     3. Anemia of chronic kidney disease Lab Results  Component Value Date   HGB 12.0 12/22/2021  Hemoglobin remains at desired goal.  Patient receives [facility] outpatient.  4. Secondary Hyperparathyroidism:  Lab Results  Component Value Date   PTH 627 (H) 07/26/2020   CALCIUM 10.4 (H) 12/22/2021   CAION 0.58 (LL) 04/22/2021   PHOS 3.2 12/22/2021    Calcium and phosphorus remain at acceptable goal.  Continue sevelamer with meals.   5.Ascites, cause unknown CT negative for liver cirrhosis.  Abdominal ultrasound shows small volume ascites in lower quadrants.  Awaiting further work-up.   LOS: 3   7/10/202312:14 PM

## 2021-12-22 NOTE — Care Management Important Message (Signed)
Important Message  Patient Details  Name: [name] MRN: [id number] Date of Birth: 11/03/1960   Medicare Important Message Given:  Yes  Patient out of room upon time of visit.  Copy of Medicare IM left in room for reference.   Dannette Barbara 12/22/2021, 11:52 AM

## 2021-12-22 NOTE — Progress Notes (Signed)
Hemodialysis Post Treatment Note:  Tx date:12/22/2021 Tx time: 3 hours Access: Left AVF UF Removed: 2 L  Note: HD treatment completed. Tolerated well. No HD complications. Post ND BP 102/80

## 2021-12-23 ENCOUNTER — Ambulatory Visit (INDEPENDENT_AMBULATORY_CARE_PROVIDER_SITE_OTHER): Payer: Commercial Managed Care - HMO | Admitting: Vascular Surgery

## 2021-12-23 ENCOUNTER — Encounter (INDEPENDENT_AMBULATORY_CARE_PROVIDER_SITE_OTHER): Payer: Commercial Managed Care - HMO

## 2021-12-23 DIAGNOSIS — F0150 Vascular dementia without behavioral disturbance: Secondary | ICD-10-CM | POA: Diagnosis not present

## 2021-12-23 DIAGNOSIS — R188 Other ascites: Secondary | ICD-10-CM | POA: Diagnosis not present

## 2021-12-23 DIAGNOSIS — I2510 Atherosclerotic heart disease of native coronary artery without angina pectoris: Secondary | ICD-10-CM | POA: Diagnosis not present

## 2021-12-23 DIAGNOSIS — D638 Anemia in other chronic diseases classified elsewhere: Secondary | ICD-10-CM | POA: Diagnosis not present

## 2021-12-23 LAB — MAGNESIUM: Magnesium: 2.2 mg/dL (ref 1.7–2.4)

## 2021-12-23 LAB — BASIC METABOLIC PANEL
Anion gap: 12 (ref 5–15)
BUN: 35 mg/dL — ABNORMAL HIGH (ref 6–20)
CO2: 28 mmol/L (ref 22–32)
Calcium: 9.9 mg/dL (ref 8.9–10.3)
Chloride: 98 mmol/L (ref 98–111)
Creatinine, Ser: 5.2 mg/dL — ABNORMAL HIGH (ref 0.44–1.00)
GFR, Estimated: 9 mL/min — ABNORMAL LOW (ref >60)
Glucose, Bld: 127 mg/dL — ABNORMAL HIGH (ref 70–99)
Potassium: 4.1 mmol/L (ref 3.5–5.1)
Sodium: 138 mmol/L (ref 135–145)

## 2021-12-23 LAB — ECHOCARDIOGRAM COMPLETE
AR max vel: 1.5 cm2
AV Area VTI: 1.44 cm2
AV Area mean vel: 1.26 cm2
AV Mean grad: 3 mmHg
AV Peak grad: 5.2 mmHg
Ao pk vel: 1.14 m/s
Area-P 1/2: 4.39 cm2
Calc EF: 28.5 %
Height: 66 in
S' Lateral: 5 cm
Single Plane A2C EF: 29.3 %
Single Plane A4C EF: 27.7 %
Weight: 2490.32 oz

## 2021-12-23 LAB — CBC
HCT: 30.3 % — ABNORMAL LOW (ref 36.0–46.0)
Hemoglobin: 9.7 g/dL — ABNORMAL LOW (ref 12.0–15.0)
MCH: 31.3 pg (ref 26.0–34.0)
MCHC: 32 g/dL (ref 30.0–36.0)
MCV: 97.7 fL (ref 80.0–100.0)
Platelets: 108 10*3/uL — ABNORMAL LOW (ref 150–400)
RBC: 3.1 MIL/uL — ABNORMAL LOW (ref 3.87–5.11)
RDW: 15.8 % — ABNORMAL HIGH (ref 11.5–15.5)
WBC: 4.2 10*3/uL (ref 4.0–10.5)
nRBC: 0 % (ref 0.0–0.2)

## 2021-12-23 LAB — TRIGLYCERIDES, BODY FLUIDS: Triglycerides, Fluid: 37 mg/dL

## 2021-12-23 LAB — CYTOLOGY - NON PAP

## 2021-12-23 MED ORDER — RANOLAZINE ER 1000 MG PO TB12: 1000.0000 mg | ORAL_TABLET | Freq: Two times a day (BID) | ORAL | 2 refills | Status: DC

## 2021-12-23 MED ORDER — MIDODRINE HCL 10 MG PO TABS: 10.0000 mg | ORAL_TABLET | ORAL | Status: DC

## 2021-12-23 NOTE — Progress Notes (Signed)
[facility]  ROUNDING NOTE   Subjective:   [name] is a 61 year old female with past medical history including congestive heart failure, hypertension, stroke, and end-stage renal disease on hemodialysis.  Patient presents to the emergency room from her dialysis clinic with complaints of shortness of breath and chest discomfort.  Patient is known to our practice and receives outpatient dialysis treatments at [facility] on a MWF schedule, supervised by Dr. Candiss Norse.  Patient seen sitting up in bed Complain of pain at paracentesis site Currently on room air, patient has been weaned to 3 L nasal cannula but has strong desire to return to baseline 2 L requirement. No lower extremity edema Abdominal distention improved, but remains   Objective:  Vital signs in last 24 hours:  Temp:  [97.7 F (36.5 C)-98.9 F (37.2 C)] 97.7 F (36.5 C) (07/11 0801) Pulse Rate:  [76-85] 78 (07/11 0801) Resp:  [14-22] 20 (07/11 0801) BP: (92-126)/(51-113) 99/66 (07/11 0801) SpO2:  [91 %-100 %] 93 % (07/11 0801) Weight:  [70.6 kg] 70.6 kg (07/10 1330)  Weight change:  Filed Weights   12/19/21 1339 12/22/21 1002 12/22/21 1330  Weight: 72.4 kg 73.4 kg 70.6 kg    Intake/Output: I/O last 3 completed shifts: In: 120 [P.O.:120] Out: 2000 [Other:2000]   Intake/Output this shift:  Total I/O In: 120 [P.O.:120] Out: -   Physical Exam: General: NAD, resting comfortably  Head: Normocephalic, atraumatic. Moist oral mucosal membranes  Eyes: Anicteric  Lungs:  Diminished in bases, normal effort,  O2  Heart: Regular rate and rhythm  Abdomen:  Firm, nontender, distended  Extremities: No peripheral edema.  Neurologic: Nonfocal, moving all four extremities  Skin: No lesions  Access: Left aVF    Basic Metabolic Panel: Recent Labs  Lab 12/19/21 0646 12/21/21 0932 12/22/21 0423 12/23/21 0259  NA 136 137 136 138  K 5.2* 4.6 5.5* 4.1  CL 100 97* 96* 98  CO2 '27 28  28 28  '$ GLUCOSE 92 123* 84 127*  BUN 36* 46* 55* 35*  CREATININE 5.69* 6.04* 7.10* 5.20*  CALCIUM 9.7 9.5 10.4* 9.9  MG  --  2.2 2.5* 2.2  PHOS  --   --  3.2  --      Liver Function Tests: Recent Labs  Lab 12/21/21 0932 12/22/21 0423  AST 31  --   ALT 21  --   ALKPHOS 62  --   BILITOT 1.2  --   PROT 6.9  --   ALBUMIN 3.7 3.9    No results for input(s): "LIPASE", "AMYLASE" in the last 168 hours. No results for input(s): "AMMONIA" in the last 168 hours.  CBC: Recent Labs  Lab 12/19/21 0646 12/21/21 0932 12/22/21 0423 12/23/21 0259  WBC 5.3 4.4 6.0 4.2  NEUTROABS 3.0 2.9  --   --   HGB 11.2* 10.8* 12.0 9.7*  HCT 35.7* 33.3* 37.0 30.3*  MCV 99.4 98.8 97.9 97.7  PLT 110* 108* 114* 108*     Cardiac Enzymes: No results for input(s): "CKTOTAL", "CKMB", "CKMBINDEX", "TROPONINI" in the last 168 hours.  BNP: Invalid input(s): "POCBNP"  CBG: No results for input(s): "GLUCAP" in the last 168 hours.  Microbiology: Results for orders placed or performed during the hospital encounter of 12/19/21  MRSA Next Gen by PCR, Nasal     Status: Abnormal   Collection Time: 12/19/21  9:21 PM   Specimen: Nasal Mucosa; Nasal Swab  Result Value Ref Range Status   MRSA by PCR Next  Gen DETECTED (A) NOT DETECTED Final    Comment: CRITICAL RESULT CALLED TO, READ BACK BY AND VERIFIED WITH: MARSHA [facility] RN @ 2238 12/19/21 LFD (NOTE) The GeneXpert MRSA Assay (FDA approved for NASAL specimens only), is one component of a comprehensive MRSA colonization surveillance program. It is not intended to diagnose MRSA infection nor to guide or monitor treatment for MRSA infections. Test performance is not FDA approved in patients less than 49 years old. Performed at [facility], [address] [place], [place] [address]   Body fluid culture w Gram Stain     Status: None (Preliminary result)   Collection Time: 12/20/21  1:46 PM   Specimen: Abdomen; Peritoneal Fluid  Result Value  Ref Range Status   Specimen Description   Final    ABDOMEN Performed at [facility], [address], [place], [place] [address]    Special Requests   Final    NONE Performed at [facility], [address] [place], [place] [address]    Gram Stain   Final    FEW WBC PRESENT, PREDOMINANTLY MONONUCLEAR NO ORGANISMS SEEN    Culture   Final    NO GROWTH 3 DAYS Performed at [facility], [place] [address], [place], [place] [address]    Report Status PENDING  Incomplete    Coagulation Studies: No results for input(s): "LABPROT", "INR" in the last 72 hours.  Urinalysis: No results for input(s): "COLORURINE", "LABSPEC", "PHURINE", "GLUCOSEU", "HGBUR", "BILIRUBINUR", "KETONESUR", "PROTEINUR", "UROBILINOGEN", "NITRITE", "LEUKOCYTESUR" in the last 72 hours.  Invalid input(s): "APPERANCEUR"    Imaging: ECHOCARDIOGRAM COMPLETE  Result Date: 12/23/2021    ECHOCARDIOGRAM REPORT   Patient Name:   [name] [facility] Date of Exam: 12/22/2021 Medical Rec #:  [id number]         Height:       66.0 in Accession #:    [id number]        Weight:       155.6 lb Date of Birth:  12-07-1960          BSA:          1.798 m Patient Age:    59 years          BP:           120/80 mmHg Patient Gender: F                 HR:           88 bpm. Exam Location:  [facility] Procedure: 2D Echo, Cardiac Doppler and Color Doppler Indications:     I50.9 Congestive heart failure  History:         Patient has prior history of Echocardiogram examinations. CHF                  and Cardiomyopathy, CAD and Previous Myocardial Infarction,                  Prior CABG, Patient on dialysis, Arrythmias:Atrial                  Fibrillation; Risk Factors:Former Smoker, Hypertension and                  Dyslipidemia.  Sonographer:     Rosalia Hammers Referring Phys:  [id number] [place] Diagnosing Phys: [place]  1. Left ventricular ejection fraction, by estimation, is 25 to 30%. The left ventricle has severely  decreased function. The left ventricle demonstrates  global hypokinesis. The left ventricular internal cavity size was severely dilated. There is mild left ventricular hypertrophy. Left ventricular diastolic parameters are consistent with Grade II diastolic dysfunction (pseudonormalization).  2. Right ventricular systolic function is moderately reduced. The right ventricular size is moderately enlarged.  3. Left atrial size was severely dilated.  4. Right atrial size was severely dilated.  5. The mitral valve is normal in structure. Mild mitral valve regurgitation.  6. Tricuspid valve regurgitation is moderate to severe.  7. The aortic valve is normal in structure. Aortic valve regurgitation is not visualized. FINDINGS  Left Ventricle: Left ventricular ejection fraction, by estimation, is 25 to 30%. The left ventricle has severely decreased function. The left ventricle demonstrates global hypokinesis. The left ventricular internal cavity size was severely dilated. There is mild left ventricular hypertrophy. Left ventricular diastolic parameters are consistent with Grade II diastolic dysfunction (pseudonormalization). Right Ventricle: The right ventricular size is moderately enlarged. No increase in right ventricular wall thickness. Right ventricular systolic function is moderately reduced. Left Atrium: Left atrial size was severely dilated. Right Atrium: Right atrial size was severely dilated. Pericardium: There is no evidence of pericardial effusion. Mitral Valve: The mitral valve is normal in structure. Mild mitral valve regurgitation. Tricuspid Valve: The tricuspid valve is grossly normal. Tricuspid valve regurgitation is moderate to severe. Aortic Valve: The aortic valve is normal in structure. Aortic valve regurgitation is not visualized. Aortic valve mean gradient measures 3.0 mmHg. Aortic valve peak gradient measures 5.2 mmHg. Aortic valve area, by VTI measures 1.44 cm. Pulmonic Valve: The pulmonic valve was  grossly normal. Pulmonic valve regurgitation is mild. Aorta: The aortic root, ascending aorta and aortic arch are all structurally normal, with no evidence of dilitation or obstruction. IAS/Shunts: No atrial level shunt detected by color flow Doppler.  LEFT VENTRICLE PLAX 2D LVIDd:         5.78 cm      Diastology LVIDs:         5.00 cm      LV e' medial:    3.53 cm/s LV PW:         1.33 cm      LV E/e' medial:  23.3 LV IVS:        1.24 cm      LV e' lateral:   7.94 cm/s LVOT diam:     1.70 cm      LV E/e' lateral: 10.4 LV SV:         29 LV SV Index:   16 LVOT Area:     2.27 cm  LV Volumes (MOD) LV vol d, MOD A2C: 300.0 ml LV vol d, MOD A4C: 253.0 ml LV vol s, MOD A2C: 212.0 ml LV vol s, MOD A4C: 183.0 ml LV SV MOD A2C:     88.0 ml LV SV MOD A4C:     253.0 ml LV SV MOD BP:      79.4 ml RIGHT VENTRICLE RV Basal diam:  5.57 cm RV S prime:     5.33 cm/s LEFT ATRIUM              Index        RIGHT ATRIUM           Index LA diam:        5.20 cm  2.89 cm/m   RA Area:     30.70 cm LA Vol (A2C):   113.0 ml 62.86 ml/m  RA Volume:   108.00 ml 60.08 ml/m LA  Vol (A4C):   88.4 ml  49.18 ml/m LA Biplane Vol: 102.0 ml 56.74 ml/m  AORTIC VALVE AV Area (Vmax):    1.50 cm AV Area (Vmean):   1.26 cm AV Area (VTI):     1.44 cm AV Vmax:           114.00 cm/s AV Vmean:          86.900 cm/s AV VTI:            0.198 m AV Peak Grad:      5.2 mmHg AV Mean Grad:      3.0 mmHg LVOT Vmax:         75.10 cm/s LVOT Vmean:        48.400 cm/s LVOT VTI:          0.126 m LVOT/AV VTI ratio: 0.64  AORTA Ao Root diam: 3.30 cm MITRAL VALVE               TRICUSPID VALVE MV Area (PHT): 4.39 cm    TR Peak grad:   44.6 mmHg MV Decel Time: 173 msec    TR Vmax:        334.00 cm/s MV E velocity: 82.30 cm/s MV A velocity: 57.80 cm/s  SHUNTS MV E/A ratio:  1.42        Systemic VTI:  0.13 m                            Systemic Diam: 1.70 cm Neoma Laming Electronically signed by Neoma Laming Signature Date/Time: 12/23/2021/11:54:27 AM    Final    US  ARTERIAL ABI (SCREENING LOWER EXTREMITY)  Result Date: 12/22/2021 CLINICAL DATA:  61 year old female with PA D EXAM: NONINVASIVE PHYSIOLOGIC VASCULAR STUDY OF BILATERAL LOWER EXTREMITIES TECHNIQUE: Evaluation of both lower extremities was performed at rest, including calculation of ankle-brachial indices, multiple segmental pressure evaluation, segmental Doppler and segmental pulse volume recording. COMPARISON:  None Available. FINDINGS: Right ABI:  1.2 Left ABI:  1.2 Right Lower Extremity: Segmental Doppler demonstrates triphasic waveforms at the right ankle Left Lower Extremity: Segmental Doppler demonstrates triphasic waveforms at the left ankle IMPRESSION: Resting ABI the bilateral lower extremity within normal limits. Segmental exam demonstrates waveforms maintained at the ankles Signed, Dulcy Fanny. Nadene Rubins, RPVI [facility] Electronically Signed   By: Corrie Mckusick D.O.   On: 12/22/2021 08:26   US PELVIC COMPLETE WITH TRANSVAGINAL  Result Date: 12/21/2021 CLINICAL DATA:  Ascites EXAM: TRANSABDOMINAL AND TRANSVAGINAL ULTRASOUND OF PELVIS TECHNIQUE: Both transabdominal and transvaginal ultrasound examinations of the pelvis were performed. Transabdominal technique was performed for global imaging of the pelvis including uterus, ovaries, adnexal regions, and pelvic cul-de-sac. It was necessary to proceed with endovaginal exam following the transabdominal exam to visualize the uterus, endometrium, ovaries and adnexa. COMPARISON:  CT 12/19/2021 FINDINGS: Uterus Measurements: 5.4 x 3.5 x 3.7 cm = volume: 36 mL. Calcified structure posteriorly in the fundus measuring up to 1.8 cm, likely calcified fibroid. Endometrium Thickness: 4 mm in thickness.  No focal abnormality visualized. Right ovary Measurements: Not visualized.  No adnexal mass seen. Left ovary Measurements: Not visualized.  No adnexal mass seen. Other findings Large volume ascites.  IMPRESSION: Calcified structures along the posterior aspect of the uterus, likely calcified fibroids. Large volume ascites. Neither ovary visualized. Electronically Signed   By: Rolm Baptise M.D.   On: 12/21/2021 19:50   US Abdomen Limited  Result Date:  12/21/2021 CLINICAL DATA:  Ascites search EXAM: LIMITED ABDOMEN ULTRASOUND FOR ASCITES TECHNIQUE: Limited ultrasound survey for ascites was performed in all four abdominal quadrants. COMPARISON:  None Available. FINDINGS: Small volume ascites in the lower quadrants only. Maximum pocket depth visualized is 5.6 cm in the left lower quadrant. IMPRESSION: Small volume ascites in the lower quadrants. Maximum pocket depth visualized is 5.6 cm in the left lower quadrant Electronically Signed   By: Delanna Ahmadi M.D.   On: 12/21/2021 19:10     Medications:     amiodarone  200 mg Oral Daily   apixaban  5 mg Oral BID   bisacodyl  10 mg Rectal Once   Chlorhexidine Gluconate Cloth  6 each Topical Q0600   cinacalcet  90 mg Oral QPM   guaiFENesin  600 mg Oral BID   levothyroxine  75 mcg Oral Q0600   midodrine  10 mg Oral Q M,W,F-HD   mupirocin ointment  1 Application Nasal BID   pantoprazole  40 mg Oral Daily   polyethylene glycol  17 g Oral Daily   rosuvastatin  20 mg Oral QHS   senna-docusate  2 tablet Oral BID   sevelamer carbonate  3,200 mg Oral TID WC     Assessment/ Plan:  [name] is a 61 y.o.  female with past medical history including congestive heart failure, hypertension, stroke, and end-stage renal disease on hemodialysis.  Patient presents to the emergency room from her dialysis clinic with complaints of shortness of breath and chest discomfort.  [place]/MWF/left aVF  Hyperkalemia with end-stage renal disease on hemodialysis.  Potassium 4.1 today, managed with dialysis.  Next dialysis scheduled for Wednesday.   2.  Acute respiratory distress likely due to volume overload/Acute on chronic combined  systolic and diastolic CHF Patient has a history of combined systolic and diastolic CHF-Echo from 0/92/3300 shows EF 25 to 30% with a grade 3 diastolic dysfunction and moderate LVH.    Weaned to 3 L nasal cannula we will continue to wean to patient baseline of 2 L.  3. Anemia of chronic kidney disease Lab Results  Component Value Date   HGB 9.7 (L) 12/23/2021  Hemoglobin within acceptable range.  Patient receives [place] outpatient.  4. Secondary Hyperparathyroidism:  Lab Results  Component Value Date   PTH 627 (H) 07/26/2020   CALCIUM 9.9 12/23/2021   CAION 0.58 (LL) 04/22/2021   PHOS 3.2 12/22/2021    We will continue to monitor bone minerals during this admission.  Continue sevelamer with meals.   5.Ascites, cause unknown CT negative for liver cirrhosis.  Abdominal ultrasound shows small volume ascites in lower quadrants.  Paracentesis performed with 2.1 L fluid removal.   LOS: 4   7/11/20231:14 PM

## 2021-12-23 NOTE — Progress Notes (Addendum)
Discharge   Patient discharged home via private vehicle. Ride will be here around 3:30. IV removed without difficulty, without redness or swelling noted at site. Discharge instructions reviewed with patient. Patient verbalizes understanding of instructions. Patient is sating 100% on RA.

## 2021-12-23 NOTE — TOC Initial Note (Signed)
Transition of Care [facility]) - Initial/Assessment Note    Patient Details  Name: [name] MRN: [id number] Date of Birth: June 17, 1960  [facility] CM/SW Contact:    Beverly Sessions, RN Phone Number: 12/23/2021, 11:43 AM  Clinical Narrative:                  Admitted for: ascites  Admitted from: home alone PCP: Humphrey Rolls Current home health/prior home health/DME: Home O2 through [place]  Patient states that her brother will pick her up at discharge.  Patient states that she will not need O2 for the ride home.  Rn to check sats on RA prior to discharge. Home O2 through [place]  Patient states she uses [facility] for transport to appointments and HD. Declines home health  [facility]ver[facility] Bicker dialysis liaison notified of discharge    Expected Discharge Plan: Home/Self Care Barriers to Discharge: Barriers Resolved   Patient Goals and CMS Choice        Expected Discharge Plan and Services Expected Discharge Plan: Home/Self Care       Living arrangements for the past 2 months: Single Family Home Expected Discharge Date: 12/23/21                                    Prior Living Arrangements/Services Living arrangements for the past 2 months: Single Family Home Lives with:: Self              Current home services: DME    Activities of Daily Living Home Assistive Devices/Equipment: None ADL Screening (condition at time of admission) Patient's cognitive ability adequate to safely complete daily activities?: Yes Is the patient deaf or have difficulty hearing?: No Does the patient have difficulty seeing, even when wearing glasses/contacts?: Yes Does the patient have difficulty concentrating, remembering, or making decisions?: No Patient able to express need for assistance with ADLs?: No Does the patient have difficulty dressing or bathing?: No Independently performs ADLs?: Yes (appropriate for developmental age) Does the patient have difficulty walking or  climbing stairs?: Yes Weakness of Legs: Both Weakness of Arms/Hands: None  Permission Sought/Granted                  Emotional Assessment              Admission diagnosis:  Shortness of breath [R06.02] Other ascites [R18.8] Ascites [R18.8] Patient Active Problem List   Diagnosis Date Noted   Acute on chronic respiratory failure with hypoxia ([place]) 12/21/2021   Chronic combined systolic and diastolic CHF (congestive heart failure) ([place]) 12/19/2021   Ascites 12/19/2021   Chronic respiratory failure with hypoxia ([place]) 12/19/2021   Hepatitis C antibody positive in blood 12/19/2021   Thrombocytopenia ([place]) 08/08/2021   Paroxysmal atrial fibrillation ([place])    Hypothyroidism    Syncope and collapse 07/25/2020   ESRD (end stage renal disease) ([place]) 01/30/2019   Unstable angina ([place]) 01/23/2019   Swelling of limb 09/22/2016   Kidney dialysis as the cause of abnormal reaction of the patient, or of later complication, without mention of misadventure at the time of the procedure (CODE) 03/24/2016   Hyperkalemia 11/13/2014   Generalized weakness 10/15/2014   ESRD on hemodialysis ([place]) 10/15/2014   Congestive heart failure (CHF) ([place]) 10/15/2014   Chronic hypotension 10/15/2014   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   Pericardial effusion 04/12/2014   History of delirium 04/12/2014   H/O atrial flutter 04/12/2014  Delayed surgical wound healing 04/12/2014   Chest wall pain following surgery 04/12/2014   S/P CABG (coronary artery bypass graft) 04/02/2014   Lactic acidosis 03/26/2014   Arteriosclerotic dementia ([place]) 03/25/2014   Atrial flutter by electrocardiogram ([place]) 03/22/2014   Superficial incisional surgical site infection 03/14/2014   Postoperative anemia due to acute blood loss 02/27/2014   H/O: substance abuse ([place]) 02/27/2014   Anemia of chronic renal failure 02/27/2014   Leukocytosis 02/26/2014   Hypotension 02/24/2014   Exhausted vascular access 02/24/2014    Anemia of chronic disease 02/24/2014   Stroke ([place]) 02/21/2014   S/P CABG x 3 02/21/2014   Dialysis patient ([place]) 02/21/2014   Hyperlipidemia 02/13/2014   History of hepatitis C 02/13/2014   HFrEF (heart failure with reduced ejection fraction) ([place]) 02/13/2014   H/O stroke without residual deficits 02/13/2014   ESRD (end stage renal disease) on dialysis ([place]) 02/13/2014   End stage renal disease with hypertension ([place]) 02/13/2014   CAD, multiple vessel 02/13/2014   Retrosternal chest pain 05/30/2013   Gallstones 05/29/2013   PCP:  [name], MD Pharmacy:   [facility] [place] [address] [place] [address] Phone: [phone] Fax: [phone]     Social Determinants of Health (SDOH) Interventions    Readmission Risk Interventions    12/23/2021   11:41 AM  Readmission Risk Prevention Plan  Transportation Screening Complete  HRI or Home Care Consult Complete  Social Work Consult for [place] Planning/Counseling Complete  Palliative Care Screening Not Applicable  Medication Review Press photographer) Complete

## 2021-12-23 NOTE — Discharge Summary (Addendum)
Physician Discharge Summary   Patient: [name] MRN: [id number] DOB: [birth year]  Admit date:     12/19/2021  Discharge date: 12/23/21  Discharge Physician: Corrie Mckusick Elba Schaber   PCP: [name], MD   Recommendations at discharge:   Follow-up with your primary care physician in 1 to 2 weeks.  Check CBC, BMP magnesium in the next visit. Follow-up with nephrology as outpatient with need for hemodialysis as outpatient for volume management  Discharge Diagnoses: Principal Problem:   Ascites Active Problems:   ESRD on hemodialysis (HCC)   Chronic combined systolic and diastolic CHF (congestive heart failure) (HCC)   Chronic respiratory failure with hypoxia (HCC)   CAD, multiple vessel   Paroxysmal atrial fibrillation (HCC)   Chronic hypotension   Anemia of chronic disease   Arteriosclerotic dementia (HCC)   Hypothyroidism   Hepatitis C antibody positive in blood  Resolved Problems:   * No resolved hospital problems. *  Hospital Course: 61 year old female with past medical history of ESRD on HD, chronic respiratory failure on 2 LPM, chronic A-fib on Eliquis, treated hep C presented to hospital with shortness of breath and abdominal distention from ascites.  During admission patient underwent hemodialysis with removal of fluids including paracentesis with 2 2 L of fluid.  Patient required 4 L of oxygen around the time of admission.    Patient was then admitted hospital and following conditions were addressed during hospitalization.  Ascites History of treated hepatitis C.  No obvious history of cirrhosis of liver.  CT scanning without acute disease.  LFT within normal limits. SAAG ratio is less than 1.1 thus less likely portal hypertension.  Possibility of heart failure induced ascites.  Pelvic ultrasound showed calcified fibroids.  Large volume ascites.  Patient underwent paracentesis with removal of around 2 L of fluid.  Will need continued hemodialysis for fluid management as  outpatient.   ESRD on hemodialysis [facility]) Monday Wednesday Friday schedule.  Received hemodialysis while in the hospital.  Chest x-ray had shown some pulmonary vascular congestion.  Has improved at this time.  Will resume hemodialysis schedule on discharge  Acute on Chronic combined systolic and diastolic CHF (congestive heart failure) (HCC) 2D echocardiogram 1 year ago showed EF of 25 to 30%,  Global hypokinesis and grade 3 diastolic dysfunction RV function was also mildly reduced.  Mild MR and TR.  Repeat 2D echocardiogram pending at this time.  Patient has clinically improved with hemodialysis and fluid management.  chronic respiratory failure with hypoxia Uses 2 L of oxygen at baseline.  Required up to 4 L on initial presentation.  Will resume oxygen on discharge. Acute hypoxic failure ruled out.    CAD, multiple vessel/Paroxysmal atrial fibrillation A-fib is controlled.  Continue Eliquis for anticoagulation..   Chronic hypotension On midodrine.  Using as needed with hemodialysis on Monday Wednesday Friday..    Anemia of chronic disease Hemoglobin of 9.7.  Continue to monitor as outpatient with   Hypothyroidism Continue Synthroid.   Hepatitis C antibody positive in blood Treated with Mavyret in the past   Skin discoloration on the leg  On initial presentation.  ABI was done which showed normal resting ABI.  No evidence of cellulitis.  Consultants: Nephrology  Procedures performed:  Hemodialysis Ultrasound-guided paracentesis on 12/20/2021.  Disposition: Home  Diet recommendation:  Discharge Diet Orders (From admission, onward)     Start     Ordered   12/23/21 0000  Diet - low sodium heart healthy  Comments: Fluid restriction 1500 MLS per day.   12/23/21 1043           Cardiac diet DISCHARGE MEDICATION: Allergies as of 12/23/2021       Reactions   Morphine And Related Hives   Levaquin [levofloxacin] Itching   Severe itching; prickly sensation   Other     Latex Rash   Tape Rash        Medication List     TAKE these medications    amiodarone 200 MG tablet Commonly known as: PACERONE Take 200 mg by mouth daily.   carvedilol 6.25 MG tablet Commonly known as: COREG Take 6.25 mg by mouth 2 (two) times daily.   cinacalcet 90 MG tablet Commonly known as: SENSIPAR Take 90 mg by mouth daily.   dapagliflozin propanediol 10 MG Tabs tablet Commonly known as: FARXIGA Take 10 mg by mouth daily.   Eliquis 5 MG Tabs tablet Generic drug: apixaban Take 5 mg by mouth 2 (two) times daily.   guaiFENesin 600 MG 12 hr tablet Commonly known as: MUCINEX Take 1 tablet (600 mg total) by mouth 2 (two) times daily.   guaiFENesin-dextromethorphan 100-10 MG/5ML syrup Commonly known as: ROBITUSSIN DM Take 5 mLs by mouth every 4 (four) hours as needed for cough.   hydrOXYzine 50 MG tablet Commonly known as: ATARAX Take 50 mg by mouth at bedtime.   levothyroxine 75 MCG tablet Commonly known as: SYNTHROID Take 75 mcg by mouth daily before breakfast.   lidocaine-prilocaine cream Commonly known as: EMLA Apply 1 application topically daily as needed (port access).   losartan 25 MG tablet Commonly known as: COZAAR Take 12.5 mg by mouth daily.   midodrine 10 MG tablet Commonly known as: PROAMATINE Take 1 tablet (10 mg total) by mouth 3 (three) times a week. Monday, Wednesday, Friday with hemodialysis Start taking on: December 24, 2021 What changed:  when to take this additional instructions   oxyCODONE-acetaminophen 5-325 MG tablet Commonly known as: PERCOCET/ROXICET Take 1 tablet by mouth 2 (two) times daily as needed for moderate pain.   pantoprazole 40 MG tablet Commonly known as: PROTONIX Take 40 mg by mouth daily.   ranolazine 1000 MG SR tablet Commonly known as: RANEXA Take 1 tablet (1,000 mg total) by mouth 2 (two) times daily.   rosuvastatin 20 MG tablet Commonly known as: CRESTOR Take 20 mg by mouth at bedtime.   sevelamer  carbonate 800 MG tablet Commonly known as: RENVELA Take 3,200 mg by mouth 3 (three) times daily with meals.       subjective Today, patient was seen and examined at bedside.  Denies any increasing shortness of breath dyspnea nausea vomiting.  Has mild soreness at the site of paracentesis   Discharge Exam: Filed Weights   12/19/21 1339 12/22/21 1002 12/22/21 1330  Weight: 72.4 kg 73.4 kg 70.6 kg      12/23/2021    8:01 AM 12/23/2021    4:05 AM 12/22/2021    7:57 PM  Vitals with BMI  Systolic 99 92 98  Diastolic 66 58 51  Pulse 78 81 83   Body mass index is 25.12 kg/m.  General:  Average built, not in obvious distress, on nasal cannula oxygen HENT:   No scleral pallor or icterus noted. Oral mucosa is moist.  Chest:  .  Diminished breath sounds bilaterally.  CVS: S1 &S2 heard. No murmur.  Regular rate and rhythm. Abdomen: Soft, nontender, mild ascites, bowel sounds are heard.   Extremities: No cyanosis,  clubbing or edema.  Peripheral pulses are palpable. Psych: Alert, awake and oriented, normal mood CNS:  No cranial nerve deficits.  Power equal in all extremities.   Skin: Warm and dry.  No rashes noted.   Condition at discharge: good  The results of significant diagnostics from this hospitalization (including imaging, microbiology, ancillary and laboratory) are listed below for reference.   Imaging Studies: ECHOCARDIOGRAM COMPLETE  Result Date: 12/23/2021    ECHOCARDIOGRAM REPORT   Patient Name:   [name] [facility] Date of Exam: 12/22/2021 Medical Rec #:  [id number]         Height:       66.0 in Accession #:    [id number]        Weight:       155.6 lb Date of Birth:  1960/10/30          BSA:          1.798 m Patient Age:    30 years          BP:           120/80 mmHg Patient Gender: F                 HR:           88 bpm. Exam Location:  [facility] Procedure: 2D Echo, Cardiac Doppler and Color Doppler Indications:     I50.9 Congestive heart failure  History:         Patient has prior  history of Echocardiogram examinations. CHF                  and Cardiomyopathy, CAD and Previous Myocardial Infarction,                  Prior CABG, Patient on dialysis, Arrythmias:Atrial                  Fibrillation; Risk Factors:Former Smoker, Hypertension and                  Dyslipidemia.  Sonographer:     Rosalia Hammers Referring Phys:  [id number] [place] Diagnosing Phys: [place]  1. Left ventricular ejection fraction, by estimation, is 25 to 30%. The left ventricle has severely decreased function. The left ventricle demonstrates global hypokinesis. The left ventricular internal cavity size was severely dilated. There is mild left ventricular hypertrophy. Left ventricular diastolic parameters are consistent with Grade II diastolic dysfunction (pseudonormalization).  2. Right ventricular systolic function is moderately reduced. The right ventricular size is moderately enlarged.  3. Left atrial size was severely dilated.  4. Right atrial size was severely dilated.  5. The mitral valve is normal in structure. Mild mitral valve regurgitation.  6. Tricuspid valve regurgitation is moderate to severe.  7. The aortic valve is normal in structure. Aortic valve regurgitation is not visualized. FINDINGS  Left Ventricle: Left ventricular ejection fraction, by estimation, is 25 to 30%. The left ventricle has severely decreased function. The left ventricle demonstrates global hypokinesis. The left ventricular internal cavity size was severely dilated. There is mild left ventricular hypertrophy. Left ventricular diastolic parameters are consistent with Grade II diastolic dysfunction (pseudonormalization). Right Ventricle: The right ventricular size is moderately enlarged. No increase in right ventricular wall thickness. Right ventricular systolic function is moderately reduced. Left Atrium: Left atrial size was severely dilated. Right Atrium: Right atrial size was severely dilated. Pericardium: There is  no evidence of pericardial effusion. Mitral Valve: The mitral valve is normal  in structure. Mild mitral valve regurgitation. Tricuspid Valve: The tricuspid valve is grossly normal. Tricuspid valve regurgitation is moderate to severe. Aortic Valve: The aortic valve is normal in structure. Aortic valve regurgitation is not visualized. Aortic valve mean gradient measures 3.0 mmHg. Aortic valve peak gradient measures 5.2 mmHg. Aortic valve area, by VTI measures 1.44 cm. Pulmonic Valve: The pulmonic valve was grossly normal. Pulmonic valve regurgitation is mild. Aorta: The aortic root, ascending aorta and aortic arch are all structurally normal, with no evidence of dilitation or obstruction. IAS/Shunts: No atrial level shunt detected by color flow Doppler.  LEFT VENTRICLE PLAX 2D LVIDd:         5.78 cm      Diastology LVIDs:         5.00 cm      LV e' medial:    3.53 cm/s LV PW:         1.33 cm      LV E/e' medial:  23.3 LV IVS:        1.24 cm      LV e' lateral:   7.94 cm/s LVOT diam:     1.70 cm      LV E/e' lateral: 10.4 LV SV:         29 LV SV Index:   16 LVOT Area:     2.27 cm  LV Volumes (MOD) LV vol d, MOD A2C: 300.0 ml LV vol d, MOD A4C: 253.0 ml LV vol s, MOD A2C: 212.0 ml LV vol s, MOD A4C: 183.0 ml LV SV MOD A2C:     88.0 ml LV SV MOD A4C:     253.0 ml LV SV MOD BP:      79.4 ml RIGHT VENTRICLE RV Basal diam:  5.57 cm RV S prime:     5.33 cm/s LEFT ATRIUM              Index        RIGHT ATRIUM           Index LA diam:        5.20 cm  2.89 cm/m   RA Area:     30.70 cm LA Vol (A2C):   113.0 ml 62.86 ml/m  RA Volume:   108.00 ml 60.08 ml/m LA Vol (A4C):   88.4 ml  49.18 ml/m LA Biplane Vol: 102.0 ml 56.74 ml/m  AORTIC VALVE AV Area (Vmax):    1.50 cm AV Area (Vmean):   1.26 cm AV Area (VTI):     1.44 cm AV Vmax:           114.00 cm/s AV Vmean:          86.900 cm/s AV VTI:            0.198 m AV Peak Grad:      5.2 mmHg AV Mean Grad:      3.0 mmHg LVOT Vmax:         75.10 cm/s LVOT Vmean:        48.400  cm/s LVOT VTI:          0.126 m LVOT/AV VTI ratio: 0.64  AORTA Ao Root diam: 3.30 cm MITRAL VALVE               TRICUSPID VALVE MV Area (PHT): 4.39 cm    TR Peak grad:   44.6 mmHg MV Decel Time: 173 msec    TR Vmax:        334.00 cm/s MV E  velocity: 82.30 cm/s MV A velocity: 57.80 cm/s  SHUNTS MV E/A ratio:  1.42        Systemic VTI:  0.13 m                            Systemic Diam: 1.70 cm Neoma Laming Electronically signed by Neoma Laming Signature Date/Time: 12/23/2021/11:54:27 AM    Final    US ARTERIAL ABI (SCREENING LOWER EXTREMITY)  Result Date: 12/22/2021 CLINICAL DATA:  61 year old female with PA D EXAM: NONINVASIVE PHYSIOLOGIC VASCULAR STUDY OF BILATERAL LOWER EXTREMITIES TECHNIQUE: Evaluation of both lower extremities was performed at rest, including calculation of ankle-brachial indices, multiple segmental pressure evaluation, segmental Doppler and segmental pulse volume recording. COMPARISON:  None Available. FINDINGS: Right ABI:  1.2 Left ABI:  1.2 Right Lower Extremity: Segmental Doppler demonstrates triphasic waveforms at the right ankle Left Lower Extremity: Segmental Doppler demonstrates triphasic waveforms at the left ankle IMPRESSION: Resting ABI the bilateral lower extremity within normal limits. Segmental exam demonstrates waveforms maintained at the ankles Signed, Dulcy Fanny. Nadene Rubins, RPVI [facility] Electronically Signed   By: Corrie Mckusick D.O.   On: 12/22/2021 08:26   US PELVIC COMPLETE WITH TRANSVAGINAL  Result Date: 12/21/2021 CLINICAL DATA:  Ascites EXAM: TRANSABDOMINAL AND TRANSVAGINAL ULTRASOUND OF PELVIS TECHNIQUE: Both transabdominal and transvaginal ultrasound examinations of the pelvis were performed. Transabdominal technique was performed for global imaging of the pelvis including uterus, ovaries, adnexal regions, and pelvic cul-de-sac. It was necessary to proceed with endovaginal exam following the  transabdominal exam to visualize the uterus, endometrium, ovaries and adnexa. COMPARISON:  CT 12/19/2021 FINDINGS: Uterus Measurements: 5.4 x 3.5 x 3.7 cm = volume: 36 mL. Calcified structure posteriorly in the fundus measuring up to 1.8 cm, likely calcified fibroid. Endometrium Thickness: 4 mm in thickness.  No focal abnormality visualized. Right ovary Measurements: Not visualized.  No adnexal mass seen. Left ovary Measurements: Not visualized.  No adnexal mass seen. Other findings Large volume ascites. IMPRESSION: Calcified structures along the posterior aspect of the uterus, likely calcified fibroids. Large volume ascites. Neither ovary visualized. Electronically Signed   By: Rolm Baptise M.D.   On: 12/21/2021 19:50   US Abdomen Limited  Result Date: 12/21/2021 CLINICAL DATA:  Ascites search EXAM: LIMITED ABDOMEN ULTRASOUND FOR ASCITES TECHNIQUE: Limited ultrasound survey for ascites was performed in all four abdominal quadrants. COMPARISON:  None Available. FINDINGS: Small volume ascites in the lower quadrants only. Maximum pocket depth visualized is 5.6 cm in the left lower quadrant. IMPRESSION: Small volume ascites in the lower quadrants. Maximum pocket depth visualized is 5.6 cm in the left lower quadrant Electronically Signed   By: Delanna Ahmadi M.D.   On: 12/21/2021 19:10   DG Chest 2 View  Result Date: 12/21/2021 CLINICAL DATA:  Shortness of breath, CHF EXAM: CHEST - 2 VIEW COMPARISON:  Radiograph 12/19/2021 FINDINGS: Unchanged cardiomegaly with prior median sternotomy and postsurgical changes of CABG. There is increased pulmonary vascular congestion. No large pleural effusion. No pneumothorax. Bones are unchanged. IMPRESSION: Cardiomegaly with increased pulmonary vascular congestion. Electronically Signed   By: Maurine Simmering M.D.   On: 12/21/2021 09:47   US Paracentesis  Result Date: 12/20/2021 INDICATION: History of end-stage renal disease, now with symptomatic ascites. Please perform  ultrasound-guided paracentesis for diagnostic and therapeutic purposes. EXAM: ULTRASOUND-GUIDED PARACENTESIS COMPARISON:  CT abdomen and pelvis-12/19/2021 MEDICATIONS: None. COMPLICATIONS: None immediate. TECHNIQUE: Informed written consent was obtained from  the patient after a discussion of the risks, benefits and alternatives to treatment. A timeout was performed prior to the initiation of the procedure. Initial ultrasound scanning demonstrates a moderate amount of ascites within the right lower abdomen which was subsequently prepped and draped in the usual sterile fashion. 1% lidocaine with epinephrine was used for local anesthesia. Under direct ultrasound guidance, an 8 Fr Safe-T-Centesis catheter was introduced. The paracentesis was performed. The catheter was removed and a dressing was applied. The patient tolerated the procedure well without immediate post procedural complication. FINDINGS: A total of approximately 2.1 liters of serous fluid was removed. Samples were sent to the laboratory as requested by the clinical team. IMPRESSION: Successful ultrasound-guided paracentesis yielding 2.1 liters of peritoneal fluid. Electronically Signed   By: Sandi Mariscal M.D.   On: 12/20/2021 14:22   CT ABDOMEN PELVIS WO CONTRAST  Result Date: 12/19/2021 CLINICAL DATA:  Dialysis patient.  Short of breath EXAM: CT ABDOMEN AND PELVIS WITHOUT CONTRAST TECHNIQUE: Multidetector CT imaging of the abdomen and pelvis was performed following the standard protocol without IV contrast. RADIATION DOSE REDUCTION: This exam was performed according to the departmental dose-optimization program which includes automated exposure control, adjustment of the mA and/or kV according to patient size and/or use of iterative reconstruction technique. COMPARISON:  CT abdomen 02/01/2021 FINDINGS: Lower chest: Lung bases are clear.  Heart is enlarged. Hepatobiliary: Moderate volume of free fluid surrounds the liver. Small gallstone measuring 5 mm.  No evidence of acute cholecystitis. No biliary duct dilatation. Pancreas: Pancreas is normal. No ductal dilatation. No pancreatic inflammation. Spleen: Normal spleen Adrenals/urinary tract: Adrenal glands normal. Kidneys are atrophic. Bladder decompressed Stomach/Bowel: Moderate hiatal hernia. Stomach and small bowel normal. The small-bowel suspended within fluid within the mesentery. No evidence of bowel obstruction. Colon normal. Vascular/Lymphatic: Dense calcification of the aorta. No adenopathy. Reproductive: Uterus and adnexa unremarkable. Other: Large volume intraperitoneal free fluid. Fluid is simple attenuation Musculoskeletal: Uniform increase in vertebral body density consistent with renal osteodystrophy IMPRESSION: 1. Large volume intraperitoneal free fluid. 2. No evidence of bowel obstruction. 3. Cholelithiasis without cholecystitis. 4.  Aortic Atherosclerosis (ICD10-I70.0). 5. No pleural fluid 6. Cardiomegaly 7. Renal osteodystrophy Electronically Signed   By: Suzy Bouchard M.D.   On: 12/19/2021 16:08   DG Chest Portable 1 View  Result Date: 12/19/2021 CLINICAL DATA:  Shortness of breath EXAM: PORTABLE CHEST 1 VIEW COMPARISON:  08/07/2021 FINDINGS: Chronic cardiomegaly. CABG and coronary stenting. Congested appearance of vessels. No effusion or pneumothorax. No air bronchogram. IMPRESSION: Chronic cardiomegaly and vascular congestion. Electronically Signed   By: Jorje Guild M.D.   On: 12/19/2021 06:25    Microbiology: Results for orders placed or performed during the hospital encounter of 12/19/21  MRSA Next Gen by PCR, Nasal     Status: Abnormal   Collection Time: 12/19/21  9:21 PM   Specimen: Nasal Mucosa; Nasal Swab  Result Value Ref Range Status   MRSA by PCR Next Gen DETECTED (A) NOT DETECTED Final    Comment: CRITICAL RESULT CALLED TO, READ BACK BY AND VERIFIED WITH: MARSHA [facility] RN @ 2238 12/19/21 LFD (NOTE) The GeneXpert MRSA Assay (FDA approved for NASAL specimens  only), is one component of a comprehensive MRSA colonization surveillance program. It is not intended to diagnose MRSA infection nor to guide or monitor treatment for MRSA infections. Test performance is not FDA approved in patients less than 19 years old. Performed at [facility], [address], [place], [place] [address]   Body fluid  culture w Gram Stain     Status: None (Preliminary result)   Collection Time: 12/20/21  1:46 PM   Specimen: Abdomen; Peritoneal Fluid  Result Value Ref Range Status   Specimen Description   Final    ABDOMEN Performed at [facility], [address], [place], [place] [address]    Special Requests   Final    NONE Performed at [facility], [address] [place], [place] [address]    Gram Stain   Final    FEW WBC PRESENT, PREDOMINANTLY MONONUCLEAR NO ORGANISMS SEEN    Culture   Final    NO GROWTH 3 DAYS Performed at [facility], [place] [address] [place],  [address]    Report Status PENDING  Incomplete    Labs: CBC: Recent Labs  Lab 12/19/21 0646 12/21/21 0932 12/22/21 0423 12/23/21 0259  WBC 5.3 4.4 6.0 4.2  NEUTROABS 3.0 2.9  --   --   HGB 11.2* 10.8* 12.0 9.7*  HCT 35.7* 33.3* 37.0 30.3*  MCV 99.4 98.8 97.9 97.7  PLT 110* 108* 114* 233*   Basic Metabolic Panel: Recent Labs  Lab 12/19/21 0646 12/21/21 0932 12/22/21 0423 12/23/21 0259  NA 136 137 136 138  K 5.2* 4.6 5.5* 4.1  CL 100 97* 96* 98  CO2 '27 28 28 28  '$ GLUCOSE 92 123* 84 127*  BUN 36* 46* 55* 35*  CREATININE 5.69* 6.04* 7.10* 5.20*  CALCIUM 9.7 9.5 10.4* 9.9  MG  --  2.2 2.5* 2.2  PHOS  --   --  3.2  --    Liver Function Tests: Recent Labs  Lab 12/21/21 0932 12/22/21 0423  AST 31  --   ALT 21  --   ALKPHOS 62  --   BILITOT 1.2  --   PROT 6.9  --   ALBUMIN 3.7 3.9   CBG: No results for input(s): "GLUCAP" in the last 168 hours.  Discharge time spent: greater than 30 minutes.  Signed: Flora Lipps,  MD [facility] 12/23/2021

## 2021-12-24 DIAGNOSIS — Z992 Dependence on renal dialysis: Secondary | ICD-10-CM | POA: Diagnosis not present

## 2021-12-24 DIAGNOSIS — N186 End stage renal disease: Secondary | ICD-10-CM | POA: Diagnosis not present

## 2021-12-24 LAB — BODY FLUID CULTURE W GRAM STAIN: Culture: NO GROWTH

## 2021-12-25 LAB — LIPASE, FLUID: Lipase-Fluid: 25 U/L

## 2021-12-26 DIAGNOSIS — Z992 Dependence on renal dialysis: Secondary | ICD-10-CM | POA: Diagnosis not present

## 2021-12-26 DIAGNOSIS — N186 End stage renal disease: Secondary | ICD-10-CM | POA: Diagnosis not present

## 2021-12-29 DIAGNOSIS — Z992 Dependence on renal dialysis: Secondary | ICD-10-CM | POA: Diagnosis not present

## 2021-12-29 DIAGNOSIS — N186 End stage renal disease: Secondary | ICD-10-CM | POA: Diagnosis not present

## 2021-12-31 DIAGNOSIS — Z992 Dependence on renal dialysis: Secondary | ICD-10-CM | POA: Diagnosis not present

## 2021-12-31 DIAGNOSIS — N186 End stage renal disease: Secondary | ICD-10-CM | POA: Diagnosis not present

## 2022-01-02 ENCOUNTER — Other Ambulatory Visit: Payer: Self-pay | Admitting: Internal Medicine

## 2022-01-02 DIAGNOSIS — R188 Other ascites: Secondary | ICD-10-CM

## 2022-01-02 DIAGNOSIS — N186 End stage renal disease: Secondary | ICD-10-CM | POA: Diagnosis not present

## 2022-01-02 DIAGNOSIS — Z992 Dependence on renal dialysis: Secondary | ICD-10-CM | POA: Diagnosis not present

## 2022-01-05 DIAGNOSIS — Z992 Dependence on renal dialysis: Secondary | ICD-10-CM | POA: Diagnosis not present

## 2022-01-05 DIAGNOSIS — N186 End stage renal disease: Secondary | ICD-10-CM | POA: Diagnosis not present

## 2022-01-06 DIAGNOSIS — I509 Heart failure, unspecified: Secondary | ICD-10-CM | POA: Diagnosis not present

## 2022-01-07 DIAGNOSIS — N186 End stage renal disease: Secondary | ICD-10-CM | POA: Diagnosis not present

## 2022-01-07 DIAGNOSIS — Z992 Dependence on renal dialysis: Secondary | ICD-10-CM | POA: Diagnosis not present

## 2022-01-08 DIAGNOSIS — M545 Low back pain, unspecified: Secondary | ICD-10-CM | POA: Diagnosis not present

## 2022-01-08 DIAGNOSIS — I10 Essential (primary) hypertension: Secondary | ICD-10-CM | POA: Diagnosis not present

## 2022-01-08 DIAGNOSIS — I70212 Atherosclerosis of native arteries of extremities with intermittent claudication, left leg: Secondary | ICD-10-CM | POA: Diagnosis not present

## 2022-01-08 DIAGNOSIS — F32A Depression, unspecified: Secondary | ICD-10-CM | POA: Diagnosis not present

## 2022-01-08 DIAGNOSIS — M25561 Pain in right knee: Secondary | ICD-10-CM | POA: Diagnosis not present

## 2022-01-08 DIAGNOSIS — E039 Hypothyroidism, unspecified: Secondary | ICD-10-CM | POA: Diagnosis not present

## 2022-01-08 DIAGNOSIS — N186 End stage renal disease: Secondary | ICD-10-CM | POA: Diagnosis not present

## 2022-01-08 DIAGNOSIS — R188 Other ascites: Secondary | ICD-10-CM | POA: Diagnosis not present

## 2022-01-08 DIAGNOSIS — E782 Mixed hyperlipidemia: Secondary | ICD-10-CM | POA: Diagnosis not present

## 2022-01-09 ENCOUNTER — Observation Stay: Payer: Medicare Other

## 2022-01-09 ENCOUNTER — Emergency Department: Payer: Medicare Other

## 2022-01-09 ENCOUNTER — Inpatient Hospital Stay
Admission: EM | Admit: 2022-01-09 | Discharge: 2022-01-12 | DRG: 291 | Disposition: A | Discharge status: Home Health | Payer: Medicare Other | Attending: Osteopathic Medicine | Admitting: Osteopathic Medicine

## 2022-01-09 ENCOUNTER — Other Ambulatory Visit: Payer: Self-pay

## 2022-01-09 DIAGNOSIS — R188 Other ascites: Secondary | ICD-10-CM | POA: Diagnosis present

## 2022-01-09 DIAGNOSIS — Z91048 Other nonmedicinal substance allergy status: Secondary | ICD-10-CM | POA: Diagnosis not present

## 2022-01-09 DIAGNOSIS — Z951 Presence of aortocoronary bypass graft: Secondary | ICD-10-CM

## 2022-01-09 DIAGNOSIS — E875 Hyperkalemia: Secondary | ICD-10-CM | POA: Diagnosis not present

## 2022-01-09 DIAGNOSIS — E039 Hypothyroidism, unspecified: Secondary | ICD-10-CM | POA: Diagnosis not present

## 2022-01-09 DIAGNOSIS — Z992 Dependence on renal dialysis: Secondary | ICD-10-CM

## 2022-01-09 DIAGNOSIS — Z8673 Personal history of transient ischemic attack (TIA), and cerebral infarction without residual deficits: Secondary | ICD-10-CM | POA: Diagnosis not present

## 2022-01-09 DIAGNOSIS — D631 Anemia in chronic kidney disease: Secondary | ICD-10-CM | POA: Diagnosis present

## 2022-01-09 DIAGNOSIS — I132 Hypertensive heart and chronic kidney disease with heart failure and with stage 5 chronic kidney disease, or end stage renal disease: Secondary | ICD-10-CM | POA: Diagnosis not present

## 2022-01-09 DIAGNOSIS — Z841 Family history of disorders of kidney and ureter: Secondary | ICD-10-CM

## 2022-01-09 DIAGNOSIS — Z8249 Family history of ischemic heart disease and other diseases of the circulatory system: Secondary | ICD-10-CM

## 2022-01-09 DIAGNOSIS — K219 Gastro-esophageal reflux disease without esophagitis: Secondary | ICD-10-CM | POA: Diagnosis not present

## 2022-01-09 DIAGNOSIS — Z7984 Long term (current) use of oral hypoglycemic drugs: Secondary | ICD-10-CM

## 2022-01-09 DIAGNOSIS — Z87891 Personal history of nicotine dependence: Secondary | ICD-10-CM

## 2022-01-09 DIAGNOSIS — E785 Hyperlipidemia, unspecified: Secondary | ICD-10-CM | POA: Diagnosis not present

## 2022-01-09 DIAGNOSIS — I5042 Chronic combined systolic (congestive) and diastolic (congestive) heart failure: Secondary | ICD-10-CM | POA: Diagnosis not present

## 2022-01-09 DIAGNOSIS — N2581 Secondary hyperparathyroidism of renal origin: Secondary | ICD-10-CM | POA: Diagnosis not present

## 2022-01-09 DIAGNOSIS — J9611 Chronic respiratory failure with hypoxia: Secondary | ICD-10-CM | POA: Diagnosis not present

## 2022-01-09 DIAGNOSIS — R0602 Shortness of breath: Principal | ICD-10-CM

## 2022-01-09 DIAGNOSIS — Z91040 Latex allergy status: Secondary | ICD-10-CM | POA: Diagnosis not present

## 2022-01-09 DIAGNOSIS — I480 Paroxysmal atrial fibrillation: Secondary | ICD-10-CM | POA: Diagnosis not present

## 2022-01-09 DIAGNOSIS — D649 Anemia, unspecified: Secondary | ICD-10-CM | POA: Diagnosis not present

## 2022-01-09 DIAGNOSIS — I739 Peripheral vascular disease, unspecified: Secondary | ICD-10-CM | POA: Diagnosis present

## 2022-01-09 DIAGNOSIS — R140 Abdominal distension (gaseous): Secondary | ICD-10-CM | POA: Diagnosis not present

## 2022-01-09 DIAGNOSIS — Z7901 Long term (current) use of anticoagulants: Secondary | ICD-10-CM

## 2022-01-09 DIAGNOSIS — I9589 Other hypotension: Secondary | ICD-10-CM | POA: Diagnosis not present

## 2022-01-09 DIAGNOSIS — I2510 Atherosclerotic heart disease of native coronary artery without angina pectoris: Secondary | ICD-10-CM | POA: Diagnosis not present

## 2022-01-09 DIAGNOSIS — D638 Anemia in other chronic diseases classified elsewhere: Secondary | ICD-10-CM | POA: Diagnosis not present

## 2022-01-09 DIAGNOSIS — Z881 Allergy status to other antibiotic agents status: Secondary | ICD-10-CM | POA: Diagnosis not present

## 2022-01-09 DIAGNOSIS — N186 End stage renal disease: Secondary | ICD-10-CM

## 2022-01-09 DIAGNOSIS — Z79890 Hormone replacement therapy (postmenopausal): Secondary | ICD-10-CM

## 2022-01-09 DIAGNOSIS — I252 Old myocardial infarction: Secondary | ICD-10-CM

## 2022-01-09 DIAGNOSIS — R768 Other specified abnormal immunological findings in serum: Secondary | ICD-10-CM | POA: Diagnosis present

## 2022-01-09 DIAGNOSIS — J811 Chronic pulmonary edema: Secondary | ICD-10-CM | POA: Diagnosis not present

## 2022-01-09 DIAGNOSIS — Z885 Allergy status to narcotic agent status: Secondary | ICD-10-CM | POA: Diagnosis not present

## 2022-01-09 DIAGNOSIS — I509 Heart failure, unspecified: Secondary | ICD-10-CM | POA: Diagnosis not present

## 2022-01-09 DIAGNOSIS — I440 Atrioventricular block, first degree: Secondary | ICD-10-CM | POA: Diagnosis present

## 2022-01-09 DIAGNOSIS — B1920 Unspecified viral hepatitis C without hepatic coma: Secondary | ICD-10-CM | POA: Diagnosis present

## 2022-01-09 DIAGNOSIS — R069 Unspecified abnormalities of breathing: Secondary | ICD-10-CM | POA: Diagnosis not present

## 2022-01-09 DIAGNOSIS — Z79899 Other long term (current) drug therapy: Secondary | ICD-10-CM

## 2022-01-09 DIAGNOSIS — I5043 Acute on chronic combined systolic (congestive) and diastolic (congestive) heart failure: Secondary | ICD-10-CM

## 2022-01-09 DIAGNOSIS — I429 Cardiomyopathy, unspecified: Secondary | ICD-10-CM | POA: Diagnosis not present

## 2022-01-09 DIAGNOSIS — Z743 Need for continuous supervision: Secondary | ICD-10-CM | POA: Diagnosis not present

## 2022-01-09 DIAGNOSIS — E8770 Fluid overload, unspecified: Secondary | ICD-10-CM | POA: Diagnosis not present

## 2022-01-09 LAB — COMPREHENSIVE METABOLIC PANEL
ALT: 16 U/L (ref 0–44)
AST: 23 U/L (ref 15–41)
Albumin: 3.6 g/dL (ref 3.5–5.0)
Alkaline Phosphatase: 88 U/L (ref 38–126)
Anion gap: 10 (ref 5–15)
BUN: 17 mg/dL (ref 6–20)
CO2: 29 mmol/L (ref 22–32)
Calcium: 8.5 mg/dL — ABNORMAL LOW (ref 8.9–10.3)
Chloride: 103 mmol/L (ref 98–111)
Creatinine, Ser: 3.71 mg/dL — ABNORMAL HIGH (ref 0.44–1.00)
GFR, Estimated: 13 mL/min — ABNORMAL LOW (ref >60)
Glucose, Bld: 88 mg/dL (ref 70–99)
Potassium: 4.1 mmol/L (ref 3.5–5.1)
Sodium: 142 mmol/L (ref 135–145)
Total Bilirubin: 1 mg/dL (ref 0.3–1.2)
Total Protein: 7.1 g/dL (ref 6.5–8.1)

## 2022-01-09 LAB — TROPONIN I (HIGH SENSITIVITY)
Troponin I (High Sensitivity): 37 ng/L — ABNORMAL HIGH
Troponin I (High Sensitivity): 37 ng/L — ABNORMAL HIGH (ref <18)

## 2022-01-09 LAB — CBC WITH DIFFERENTIAL/PLATELET
Abs Immature Granulocytes: 0.01 K/uL (ref 0.00–0.07)
Basophils Absolute: 0 K/uL (ref 0.0–0.1)
Basophils Relative: 1 %
Eosinophils Absolute: 0.1 K/uL (ref 0.0–0.5)
Eosinophils Relative: 2 %
HCT: 30.9 % — ABNORMAL LOW (ref 36.0–46.0)
Hemoglobin: 9.4 g/dL — ABNORMAL LOW (ref 12.0–15.0)
Immature Granulocytes: 0 %
Lymphocytes Relative: 23 %
Lymphs Abs: 0.9 K/uL (ref 0.7–4.0)
MCH: 31.1 pg (ref 26.0–34.0)
MCHC: 30.4 g/dL (ref 30.0–36.0)
MCV: 102.3 fL — ABNORMAL HIGH (ref 80.0–100.0)
Monocytes Absolute: 0.6 K/uL (ref 0.1–1.0)
Monocytes Relative: 17 %
Neutro Abs: 2.2 K/uL (ref 1.7–7.7)
Neutrophils Relative %: 57 %
Platelets: 114 K/uL — ABNORMAL LOW (ref 150–400)
RBC: 3.02 MIL/uL — ABNORMAL LOW (ref 3.87–5.11)
RDW: 16.4 % — ABNORMAL HIGH (ref 11.5–15.5)
WBC: 3.8 K/uL — ABNORMAL LOW (ref 4.0–10.5)
nRBC: 0 % (ref 0.0–0.2)

## 2022-01-09 LAB — BRAIN NATRIURETIC PEPTIDE: B Natriuretic Peptide: 2400.6 pg/mL — ABNORMAL HIGH (ref 0.0–100.0)

## 2022-01-09 LAB — MAGNESIUM: Magnesium: 2.3 mg/dL (ref 1.7–2.4)

## 2022-01-09 MED ORDER — ACETAMINOPHEN 325 MG PO TABS: 650.0000 mg | ORAL_TABLET | Freq: Four times a day (QID) | ORAL | Status: DC | PRN

## 2022-01-09 MED ORDER — OXYCODONE-ACETAMINOPHEN 5-325 MG PO TABS
1.0000 | ORAL_TABLET | Freq: Two times a day (BID) | ORAL | Status: DC | PRN
  Administered 2022-01-10 – 2022-01-12 (×5): 1 via ORAL
  Filled 2022-01-09 (×6): qty 1

## 2022-01-09 MED ORDER — APIXABAN 5 MG PO TABS
5.0000 mg | ORAL_TABLET | Freq: Two times a day (BID) | ORAL | Status: DC
  Administered 2022-01-09 – 2022-01-12 (×4): 5 mg via ORAL
  Filled 2022-01-09 (×6): qty 1

## 2022-01-09 MED ORDER — LEVOTHYROXINE SODIUM 75 MCG PO TABS
75.0000 ug | ORAL_TABLET | Freq: Every day | ORAL | Status: DC
  Administered 2022-01-10 – 2022-01-12 (×3): 75 ug via ORAL
  Filled 2022-01-09 (×3): qty 1

## 2022-01-09 MED ORDER — ACETAMINOPHEN 650 MG RE SUPP: 650.0000 mg | Freq: Four times a day (QID) | RECTAL | Status: DC | PRN

## 2022-01-09 MED ORDER — SEVELAMER CARBONATE 800 MG PO TABS
3200.0000 mg | ORAL_TABLET | Freq: Three times a day (TID) | ORAL | Status: DC
  Administered 2022-01-10 – 2022-01-11 (×4): 3200 mg via ORAL
  Filled 2022-01-09 (×6): qty 4

## 2022-01-09 MED ORDER — SODIUM CHLORIDE 0.9% FLUSH
3.0000 mL | Freq: Two times a day (BID) | INTRAVENOUS | Status: DC
Start: 2022-01-09 — End: 2022-01-12
  Administered 2022-01-10 – 2022-01-11 (×4): 3 mL via INTRAVENOUS

## 2022-01-09 MED ORDER — ROSUVASTATIN CALCIUM 10 MG PO TABS
20.0000 mg | ORAL_TABLET | Freq: Every day | ORAL | Status: DC
Start: 2022-01-09 — End: 2022-01-12
  Administered 2022-01-09 – 2022-01-11 (×3): 20 mg via ORAL
  Filled 2022-01-09: qty 2
  Filled 2022-01-09: qty 1
  Filled 2022-01-09: qty 2
  Filled 2022-01-09: qty 1

## 2022-01-09 MED ORDER — AMIODARONE HCL 200 MG PO TABS
200.0000 mg | ORAL_TABLET | Freq: Every day | ORAL | Status: DC
  Administered 2022-01-10 – 2022-01-12 (×3): 200 mg via ORAL
  Filled 2022-01-09 (×3): qty 1

## 2022-01-09 MED ORDER — CINACALCET HCL 30 MG PO TABS
90.0000 mg | ORAL_TABLET | Freq: Every day | ORAL | Status: DC
  Administered 2022-01-10 – 2022-01-11 (×2): 90 mg via ORAL
  Filled 2022-01-09 (×3): qty 3

## 2022-01-09 MED ORDER — HYDROXYZINE HCL 25 MG PO TABS
50.0000 mg | ORAL_TABLET | Freq: Two times a day (BID) | ORAL | Status: DC | PRN
  Administered 2022-01-09 – 2022-01-10 (×3): 50 mg via ORAL
  Filled 2022-01-09 (×3): qty 2

## 2022-01-09 MED ORDER — RANOLAZINE ER 500 MG PO TB12
1000.0000 mg | ORAL_TABLET | Freq: Two times a day (BID) | ORAL | Status: DC
  Administered 2022-01-09 – 2022-01-11 (×5): 1000 mg via ORAL
  Filled 2022-01-09 (×5): qty 2

## 2022-01-09 MED ORDER — MIDODRINE HCL 5 MG PO TABS: 10.0000 mg | ORAL_TABLET | ORAL | Status: DC

## 2022-01-09 MED ORDER — POLYETHYLENE GLYCOL 3350 17 G PO PACK
17.0000 g | PACK | Freq: Every day | ORAL | Status: DC | PRN
  Administered 2022-01-11: 17 g via ORAL
  Filled 2022-01-09: qty 1

## 2022-01-09 MED ORDER — PANTOPRAZOLE SODIUM 40 MG PO TBEC
40.0000 mg | DELAYED_RELEASE_TABLET | Freq: Every day | ORAL | Status: DC
  Administered 2022-01-10 – 2022-01-11 (×2): 40 mg via ORAL
  Filled 2022-01-09 (×2): qty 1

## 2022-01-09 NOTE — H&P (View-Only) (Signed)
History and Physical   [name] Durante BHA:[id number] DOB: 04/09/1961 DOA: 01/09/2022  PCP: [name], MD   Patient coming from: Home  Chief Complaint: Shortness of breath  HPI: [name] is a 61 y.o. female with medical history significant of CHF, ESRD on HD, hypertension, hepatitis C status posttreatment, chronic respiratory failure with hypoxia, CAD status post CABG, atrial fibrillation, anemia, hypothyroidism, stroke, hyperlipidemia, pericardial effusion status post pericardiotomy presenting with shortness of breath.  Patient has had several days of shortness of breath.  She went to HD today and due to her hypotension they were unable to pull off fluid.  She states that upon returning home she felt significantly more short of breath and presented to the ED for further evaluation.  She also reports a nonproductive cough and some pleuritic chest pain.  She additionally has noted some increase in her abdominal size (which is of note as she had ascites admission earlier this month leave to be secondary to CHF due to SAAG less than 1.1, and concurrent admission for acute CHF at that time) she does not make urine.  She denies fevers, chills, abdominal pain, constipation, diarrhea, nausea, vomiting.  ED Course: Vital signs in the ED significant for blood pressure in the 97D to 532 systolic.  This is near her baseline.  Lab work-up included CMP with creatinine stable at 3.71, calcium 8.5.  CBC with hemoglobin stable at 9.4, platelets stable 114, white count near baseline at 3.8.  Troponin flat at 37 and then 37 on repeat.  BNP elevated to 2400.  Chest x-ray showed cardiomegaly with vascular congestion and mild pulmonary edema.  Review of Systems: As per HPI otherwise all other systems reviewed and are negative.  Past Medical History:  Diagnosis Date   Adult behavior problems    frontal lobe CVA   Anemia    chronic disease   Atrial flutter by electrocardiogram ([place]) 03/22/2014    Cardiomyopathy ([place])    CHF (congestive heart failure) ([place])    Chicken pox    Coronary artery disease    Delayed surgical wound healing 04/12/2014   Overview:  Left leg   GERD (gastroesophageal reflux disease)    Heart failure (HCC)    Hepatitis    history of hep c   History of bipolar disorder    History of CVA (cerebrovascular accident) 02/21/2014   Hyperlipidemia    Hypertension    Hypotension 02/24/2014   Myocardial infarction [facility])    Obesity    Paroxysmal atrial fibrillation (HCC)    Peripheral vascular disease ([place])    Renal failure    Renal insufficiency    Stroke [facility]) 2011   Superficial incisional surgical site infection 03/14/2014    Past Surgical History:  Procedure Laterality Date   A/V FISTULAGRAM Left 03/30/2019   Procedure: A/V FISTULAGRAM;  Surgeon: Algernon Huxley, MD;  Location: [place] [facility];  Service: Cardiovascular;  Laterality: Left;   A/V FISTULAGRAM Left 10/06/2019   Procedure: A/V FISTULAGRAM;  Surgeon: Algernon Huxley, MD;  Location: [place] [facility];  Service: Cardiovascular;  Laterality: Left;   A/V FISTULAGRAM Left 05/19/2021   Procedure: A/V FISTULAGRAM;  Surgeon: Algernon Huxley, MD;  Location: [place] [facility];  Service: Cardiovascular;  Laterality: Left;   COLONOSCOPY     COLONOSCOPY WITH PROPOFOL N/A 04/22/2021   Procedure: COLONOSCOPY WITH PROPOFOL;  Surgeon: Lesly Rubenstein, MD;  Location: [facility] ENDOSCOPY;  Service: Endoscopy;  Laterality: N/A;   CORONARY ANGIOPLASTY  WITH STENT PLACEMENT  2013   CORONARY ARTERY BYPASS GRAFT     DIALYSIS FISTULA CREATION     ESOPHAGOGASTRODUODENOSCOPY     ESOPHAGOGASTRODUODENOSCOPY N/A 04/22/2021   Procedure: ESOPHAGOGASTRODUODENOSCOPY (EGD);  Surgeon: Lesly Rubenstein, MD;  Location: [facility] ENDOSCOPY;  Service: Endoscopy;  Laterality: N/A;   FLEXIBLE SIGMOIDOSCOPY N/A 02/23/2019   Procedure: FLEXIBLE SIGMOIDOSCOPY;  Surgeon: Lollie Sails, MD;  Location: [facility] ENDOSCOPY;  Service: Endoscopy;   Laterality: N/A;   LEFT HEART CATH AND CORS/GRAFTS ANGIOGRAPHY N/A 08/08/2018   Procedure: LEFT HEART CATH AND CORS/GRAFTS ANGIOGRAPHY;  Surgeon: Dionisio David, MD;  Location: [place] [facility];  Service: Cardiovascular;  Laterality: N/A;   LEFT HEART CATH AND CORS/GRAFTS ANGIOGRAPHY N/A 01/30/2019   Procedure: LEFT HEART CATH AND CORS/GRAFTS ANGIOGRAPHY;  Surgeon: Dionisio David, MD;  Location: [place] [facility];  Service: Cardiovascular;  Laterality: N/A;   percutaneous insertion intra aortic balloon right cath     ultrasound guided pericardiocentesis      Social History  reports that she has quit smoking. Her smoking use included cigarettes. She has a 5.00 pack-year smoking history. She has never used smokeless tobacco. She reports that she does not drink alcohol and does not use drugs.  Allergies  Allergen Reactions   Morphine And Related Hives   Levaquin [Levofloxacin] Itching    Severe itching; prickly sensation   Other    Latex Rash   Tape Rash    Family History  Problem Relation Age of Onset   Hypertension Mother    Ovarian cancer Mother    Diabetes type II Mother    Hypertension Father    Kidney disease Father    Hypertension Sister    Diabetes type II Maternal Grandmother    Breast cancer Maternal Grandmother    Breast cancer Maternal Aunt    Hypertension Other    Cancer Other    Renal Disease Other   Reviewed on admission  Prior to Admission medications   Medication Sig Start Date End Date Taking? Authorizing Provider  amiodarone (PACERONE) 200 MG tablet Take 200 mg by mouth daily. 07/30/20   [provider]  apixaban (ELIQUIS) 5 MG TABS tablet Take 5 mg by mouth 2 (two) times daily. 09/11/19   [provider]  carvedilol (COREG) 6.25 MG tablet Take 6.25 mg by mouth 2 (two) times daily.    [provider]  cinacalcet (SENSIPAR) 90 MG tablet Take 90 mg by mouth daily.    [provider]  dapagliflozin propanediol  (FARXIGA) 10 MG TABS tablet Take 10 mg by mouth daily.    [provider]  guaiFENesin (MUCINEX) 600 MG 12 hr tablet Take 1 tablet (600 mg total) by mouth 2 (two) times daily. 08/09/21   Danford, Suann Larry, MD  guaiFENesin-dextromethorphan (ROBITUSSIN DM) 100-10 MG/5ML syrup Take 5 mLs by mouth every 4 (four) hours as needed for cough. 08/09/21   Danford, Suann Larry, MD  hydrOXYzine (ATARAX/VISTARIL) 50 MG tablet Take 50 mg by mouth at bedtime.    [provider]  levothyroxine (SYNTHROID) 75 MCG tablet Take 75 mcg by mouth daily before breakfast.  11/02/18   [provider]  lidocaine-prilocaine (EMLA) cream Apply 1 application topically daily as needed (port access).    [provider]  losartan (COZAAR) 25 MG tablet Take 12.5 mg by mouth daily.    [provider]  midodrine (PROAMATINE) 10 MG tablet Take 1 tablet (10 mg total) by mouth 3 (three) times  a week. Monday, Wednesday, Friday with hemodialysis 12/24/21   Pokhrel, Corrie Mckusick, MD  oxyCODONE-acetaminophen (PERCOCET/ROXICET) 5-325 MG tablet Take 1 tablet by mouth 2 (two) times daily as needed for moderate pain.    [provider]  pantoprazole (PROTONIX) 40 MG tablet Take 40 mg by mouth daily.    [provider]  ranolazine (RANEXA) 1000 MG SR tablet Take 1 tablet (1,000 mg total) by mouth 2 (two) times daily. 12/23/21 03/23/22  Pokhrel, Corrie Mckusick, MD  rosuvastatin (CRESTOR) 20 MG tablet Take 20 mg by mouth at bedtime. 11/07/21   [provider]  sevelamer carbonate (RENVELA) 800 MG tablet Take 3,200 mg by mouth 3 (three) times daily with meals.    [provider]    Physical Exam: Vitals:   01/09/22 1808 01/09/22 1830 01/09/22 1900 01/09/22 2000  BP:  109/70 107/70 (!) 97/59  Pulse:  82 80 83  Resp:  '14 13 16  '$ Temp: 98.1 F (36.7 C)     TempSrc: Oral     SpO2:  100% 99% 100%  Weight:      Height:        Physical Exam Constitutional:      General: She is  not in acute distress.    Appearance: Normal appearance.  HENT:     Head: Normocephalic and atraumatic.     Mouth/Throat:     Mouth: Mucous membranes are moist.     Pharynx: Oropharynx is clear.  Eyes:     Extraocular Movements: Extraocular movements intact.     Pupils: Pupils are equal, round, and reactive to light.  Cardiovascular:     Rate and Rhythm: Normal rate and regular rhythm.     Pulses: Normal pulses.     Heart sounds: Normal heart sounds.  Pulmonary:     Effort: Pulmonary effort is normal. No respiratory distress.     Breath sounds: Rales present.  Abdominal:     General: Bowel sounds are normal. There is distension.     Palpations: Abdomen is soft.     Tenderness: There is no abdominal tenderness.  Musculoskeletal:        General: No swelling or deformity.     Right lower leg: Edema present.     Left lower leg: Edema present.  Skin:    General: Skin is warm and dry.  Neurological:     General: No focal deficit present.     Mental Status: Mental status is at baseline.    Labs on Admission: I have personally reviewed following labs and imaging studies  CBC: Recent Labs  Lab 01/09/22 1804  WBC 3.8*  NEUTROABS 2.2  HGB 9.4*  HCT 30.9*  MCV 102.3*  PLT 114*    Basic Metabolic Panel: Recent Labs  Lab 01/09/22 1804  NA 142  K 4.1  CL 103  CO2 29  GLUCOSE 88  BUN 17  CREATININE 3.71*  CALCIUM 8.5*    GFR: Estimated Creatinine Clearance: 16.7 mL/min (A) (by C-G formula based on SCr of 3.71 mg/dL (H)).  Liver Function Tests: Recent Labs  Lab 01/09/22 1804  AST 23  ALT 16  ALKPHOS 88  BILITOT 1.0  PROT 7.1  ALBUMIN 3.6    Urine analysis:    Component Value Date/Time   COLORURINE Yellow 09/14/2012 0414   APPEARANCEUR Cloudy 09/14/2012 0414   LABSPEC 1.018 09/14/2012 0414   PHURINE 9.0 09/14/2012 0414   GLUCOSEU 50 mg/dL 09/14/2012 0414   HGBUR 1+ 09/14/2012 0414   BILIRUBINUR Negative  09/14/2012 [place] Negative 09/14/2012  0414   PROTEINUR >=500 09/14/2012 0414   NITRITE Negative 09/14/2012 0414   LEUKOCYTESUR 2+ 09/14/2012 0414    Radiological Exams on Admission: DG Chest Portable 1 View  Result Date: 01/09/2022 CLINICAL DATA:  Shortness of breath EXAM: PORTABLE CHEST 1 VIEW COMPARISON:  12/21/2021, 08/07/2021 FINDINGS: Post sternotomy changes. Cardiomegaly with vascular congestion and mild pulmonary edema. Stent in the left upper extremity. No consolidation or pneumothorax. IMPRESSION: Cardiomegaly with vascular congestion and mild pulmonary edema. Electronically Signed   By: Donavan Foil M.D.   On: 01/09/2022 18:12    EKG: Independently reviewed.  Sinus rhythm at 82 bpm.  Some baseline wander.  PVC noted.  Right bundle branch block.  Similar to previous.  Assessment/Plan Principal Problem:   Acute on chronic combined systolic and diastolic congestive heart failure (HCC) Active Problems:   ESRD on hemodialysis (HCC)   Chronic combined systolic and diastolic CHF (congestive heart failure) (HCC)   Chronic respiratory failure with hypoxia (HCC)   CAD, multiple vessel   Paroxysmal atrial fibrillation (HCC)   Chronic hypotension   Anemia of chronic disease   Hypothyroidism   S/P CABG x 3   Hyperlipidemia   H/O stroke without residual deficits   Hepatitis C antibody positive in blood   Acute on chronic combined systolic and diastolic congestive heart failure > Echo earlier this month with EF 25-30%, G2 DD, moderately reduced RV function. > Presenting with worsening shortness of breath with BNP elevated to 2400.  Fortunately no hypoxia currently however is significantly symptomatic.  Rales on exam, lower extreme edema. > Patient's symptoms worsened after she went to dialysis now unable to pull fluid due to her hypotension. > Patient does not make urine and will need dialysis to remove fluid. > Also noted to have increasing abdomen size likely from ascites which she did have during previous CHF  exacerbation earlier this month as well. - Monitor on progressive unit, due to risk of recurrent hypotension. - Strict I's and O's, daily weights - Trend renal function and electrolytes - Check magnesium - Already had recent echo - Dialysis for volume removal, will need nephrology consult in the morning.  ESRD on HD > As above is ESRD patient on HD was unable to have fluid removed today due to low blood pressure. > Electrolytes and creatinine stable in ED. > Of note, patient states that family is bringing [employer] from outside for her to eat, discouraged patient from consuming significant amount of the salty fast food if any. - Will need nephrology consult in the morning. - Trend renal function and electrolytes - Continue home Sensipar and sevelamer. - Renal diet with fluid restriction  Chronic hypertension > Patient with known history of chronic hypotension on midodrine with HD sessions. > Unable to pull fluid off due to hypotension today as above. > Remains in the 56E systolic in the ED which appears to be near her baseline. - Continue home midodrine, consider increasing frequency of dosing  CAD > Status post CABG - Continue home Ranexa, rosuvastatin - Holding home carvedilol and losartan in the setting of hypotension.  Atrial fibrillation - Continue home Eliquis - Holding home carvedilol in setting of borderline hypotension.  Anemia > Chronic kidney disease.  Hemoglobin stable at 9.4 in ED. - Trend CBC  Hypothyroidism - Continue home Synthroid  History of CVA Hyperlipidemia - Continue home rosuvastatin  DVT prophylaxis: Eliquis Code Status:   Full Family Communication:  None on  admission, states family is aware of admission  Disposition Plan:   Patient is from:  Home  Anticipated DC to:  Home  Anticipated DC date:  1 to 5 days  Anticipated DC barriers: None  Consults called:  None.  Nephrology will need to be consulted in the morning. Admission status:   Observation, progressive  Severity of Illness: The appropriate patient status for this patient is OBSERVATION. Observation status is judged to be reasonable and necessary in order to provide the required intensity of service to ensure the patient's safety. The patient's presenting symptoms, physical exam findings, and initial radiographic and laboratory data in the context of their medical condition is felt to place them at decreased risk for further clinical deterioration. Furthermore, it is anticipated that the patient will be medically stable for discharge from the hospital within 2 midnights of admission.    Marcelyn Bruins MD [facility]  How to contact the [facility] Attending or Consulting provider [place] or covering provider during after hours [place], for this patient?   Check the care team in [facility] and look for a) attending/consulting [facility] provider listed and b) the [facility] team listed Log into [email or web address] and use [place]'s universal password to access. If you do not have the password, please contact the hospital operator. Locate the [facility] provider you are looking for under [facility] and page to a number that you can be directly reached. If you still have difficulty reaching the provider, please page the [facility] (Director on Call) for the Hospitalists listed on amion for assistance.  01/09/2022, 8:44 PM

## 2022-01-09 NOTE — H&P (Addendum)
History and Physical   [name] XTK:[id number] DOB: 02-24-1961 DOA: 01/09/2022  PCP: [name], MD   Patient coming from: Home  Chief Complaint: Shortness of breath  HPI: [name] is a 61 y.o. female with medical history significant of CHF, ESRD on HD, hypertension, hepatitis C status posttreatment, chronic respiratory failure with hypoxia, CAD status post CABG, atrial fibrillation, anemia, hypothyroidism, stroke, hyperlipidemia, pericardial effusion status post pericardiotomy presenting with shortness of breath.  Patient has had several days of shortness of breath.  She went to HD today and due to her hypotension they were unable to pull off fluid.  She states that upon returning home she felt significantly more short of breath and presented to the ED for further evaluation.  She also reports a nonproductive cough and some pleuritic chest pain.  She additionally has noted some increase in her abdominal size (which is of note as she had ascites admission earlier this month leave to be secondary to CHF due to SAAG less than 1.1, and concurrent admission for acute CHF at that time) she does not make urine.  She denies fevers, chills, abdominal pain, constipation, diarrhea, nausea, vomiting.  ED Course: Vital signs in the ED significant for blood pressure in the 99M to 426 systolic.  This is near her baseline.  Lab work-up included CMP with creatinine stable at 3.71, calcium 8.5.  CBC with hemoglobin stable at 9.4, platelets stable 114, white count near baseline at 3.8.  Troponin flat at 37 and then 37 on repeat.  BNP elevated to 2400.  Chest x-ray showed cardiomegaly with vascular congestion and mild pulmonary edema.  Review of Systems: As per HPI otherwise all other systems reviewed and are negative.  Past Medical History:  Diagnosis Date   Adult behavior problems    frontal lobe CVA   Anemia    chronic disease   Atrial flutter by electrocardiogram ([place]) 03/22/2014    Cardiomyopathy ([place])    CHF (congestive heart failure) ([place])    Chicken pox    Coronary artery disease    Delayed surgical wound healing 04/12/2014   Overview:  Left leg   GERD (gastroesophageal reflux disease)    Heart failure (HCC)    Hepatitis    history of hep c   History of bipolar disorder    History of CVA (cerebrovascular accident) 02/21/2014   Hyperlipidemia    Hypertension    Hypotension 02/24/2014   Myocardial infarction [facility])    Obesity    Paroxysmal atrial fibrillation (HCC)    Peripheral vascular disease ([place])    Renal failure    Renal insufficiency    Stroke [facility]) 2011   Superficial incisional surgical site infection 03/14/2014    Past Surgical History:  Procedure Laterality Date   A/V FISTULAGRAM Left 03/30/2019   Procedure: A/V FISTULAGRAM;  Surgeon: Algernon Huxley, MD;  Location: [place] CV LAB;  Service: Cardiovascular;  Laterality: Left;   A/V FISTULAGRAM Left 10/06/2019   Procedure: A/V FISTULAGRAM;  Surgeon: Algernon Huxley, MD;  Location: [place] CV LAB;  Service: Cardiovascular;  Laterality: Left;   A/V FISTULAGRAM Left 05/19/2021   Procedure: A/V FISTULAGRAM;  Surgeon: Algernon Huxley, MD;  Location: [place] CV LAB;  Service: Cardiovascular;  Laterality: Left;   COLONOSCOPY     COLONOSCOPY WITH PROPOFOL N/A 04/22/2021   Procedure: COLONOSCOPY WITH PROPOFOL;  Surgeon: Lesly Rubenstein, MD;  Location: [facility] ENDOSCOPY;  Service: Endoscopy;  Laterality: N/A;   CORONARY ANGIOPLASTY  WITH STENT PLACEMENT  2013   CORONARY ARTERY BYPASS GRAFT     DIALYSIS FISTULA CREATION     ESOPHAGOGASTRODUODENOSCOPY     ESOPHAGOGASTRODUODENOSCOPY N/A 04/22/2021   Procedure: ESOPHAGOGASTRODUODENOSCOPY (EGD);  Surgeon: Lesly Rubenstein, MD;  Location: [facility] ENDOSCOPY;  Service: Endoscopy;  Laterality: N/A;   FLEXIBLE SIGMOIDOSCOPY N/A 02/23/2019   Procedure: FLEXIBLE SIGMOIDOSCOPY;  Surgeon: Lollie Sails, MD;  Location: [facility] ENDOSCOPY;  Service: Endoscopy;   Laterality: N/A;   LEFT HEART CATH AND CORS/GRAFTS ANGIOGRAPHY N/A 08/08/2018   Procedure: LEFT HEART CATH AND CORS/GRAFTS ANGIOGRAPHY;  Surgeon: Dionisio David, MD;  Location: [place] CV LAB;  Service: Cardiovascular;  Laterality: N/A;   LEFT HEART CATH AND CORS/GRAFTS ANGIOGRAPHY N/A 01/30/2019   Procedure: LEFT HEART CATH AND CORS/GRAFTS ANGIOGRAPHY;  Surgeon: Dionisio David, MD;  Location: [place] CV LAB;  Service: Cardiovascular;  Laterality: N/A;   percutaneous insertion intra aortic balloon right cath     ultrasound guided pericardiocentesis      Social History  reports that she has quit smoking. Her smoking use included cigarettes. She has a 5.00 pack-year smoking history. She has never used smokeless tobacco. She reports that she does not drink alcohol and does not use drugs.  Allergies  Allergen Reactions   Morphine And Related Hives   Levaquin [Levofloxacin] Itching    Severe itching; prickly sensation   Other    Latex Rash   Tape Rash    Family History  Problem Relation Age of Onset   Hypertension Mother    Ovarian cancer Mother    Diabetes type II Mother    Hypertension Father    Kidney disease Father    Hypertension Sister    Diabetes type II Maternal Grandmother    Breast cancer Maternal Grandmother    Breast cancer Maternal Aunt    Hypertension Other    Cancer Other    Renal Disease Other   Reviewed on admission  Prior to Admission medications   Medication Sig Start Date End Date Taking? Authorizing Provider  amiodarone (PACERONE) 200 MG tablet Take 200 mg by mouth daily. 07/30/20   [provider]  apixaban (ELIQUIS) 5 MG TABS tablet Take 5 mg by mouth 2 (two) times daily. 09/11/19   [provider]  carvedilol (COREG) 6.25 MG tablet Take 6.25 mg by mouth 2 (two) times daily.    [provider]  cinacalcet (SENSIPAR) 90 MG tablet Take 90 mg by mouth daily.    [provider]  dapagliflozin propanediol  (FARXIGA) 10 MG TABS tablet Take 10 mg by mouth daily.    [provider]  guaiFENesin (MUCINEX) 600 MG 12 hr tablet Take 1 tablet (600 mg total) by mouth 2 (two) times daily. 08/09/21   Danford, Suann Larry, MD  guaiFENesin-dextromethorphan (ROBITUSSIN DM) 100-10 MG/5ML syrup Take 5 mLs by mouth every 4 (four) hours as needed for cough. 08/09/21   Danford, Suann Larry, MD  hydrOXYzine (ATARAX/VISTARIL) 50 MG tablet Take 50 mg by mouth at bedtime.    [provider]  levothyroxine (SYNTHROID) 75 MCG tablet Take 75 mcg by mouth daily before breakfast.  11/02/18   [provider]  lidocaine-prilocaine (EMLA) cream Apply 1 application topically daily as needed (port access).    [provider]  losartan (COZAAR) 25 MG tablet Take 12.5 mg by mouth daily.    [provider]  midodrine (PROAMATINE) 10 MG tablet Take 1 tablet (10 mg total) by mouth 3 (three) times  a week. Monday, Wednesday, Friday with hemodialysis 12/24/21   Pokhrel, Corrie Mckusick, MD  oxyCODONE-acetaminophen (PERCOCET/ROXICET) 5-325 MG tablet Take 1 tablet by mouth 2 (two) times daily as needed for moderate pain.    [provider]  pantoprazole (PROTONIX) 40 MG tablet Take 40 mg by mouth daily.    [provider]  ranolazine (RANEXA) 1000 MG SR tablet Take 1 tablet (1,000 mg total) by mouth 2 (two) times daily. 12/23/21 03/23/22  Pokhrel, Corrie Mckusick, MD  rosuvastatin (CRESTOR) 20 MG tablet Take 20 mg by mouth at bedtime. 11/07/21   [provider]  sevelamer carbonate (RENVELA) 800 MG tablet Take 3,200 mg by mouth 3 (three) times daily with meals.    [provider]    Physical Exam: Vitals:   01/09/22 1808 01/09/22 1830 01/09/22 1900 01/09/22 2000  BP:  109/70 107/70 (!) 97/59  Pulse:  82 80 83  Resp:  '14 13 16  '$ Temp: 98.1 F (36.7 C)     TempSrc: Oral     SpO2:  100% 99% 100%  Weight:      Height:        Physical Exam Constitutional:      General: She is  not in acute distress.    Appearance: Normal appearance.  HENT:     Head: Normocephalic and atraumatic.     Mouth/Throat:     Mouth: Mucous membranes are moist.     Pharynx: Oropharynx is clear.  Eyes:     Extraocular Movements: Extraocular movements intact.     Pupils: Pupils are equal, round, and reactive to light.  Cardiovascular:     Rate and Rhythm: Normal rate and regular rhythm.     Pulses: Normal pulses.     Heart sounds: Normal heart sounds.  Pulmonary:     Effort: Pulmonary effort is normal. No respiratory distress.     Breath sounds: Rales present.  Abdominal:     General: Bowel sounds are normal. There is distension.     Palpations: Abdomen is soft.     Tenderness: There is no abdominal tenderness.  Musculoskeletal:        General: No swelling or deformity.     Right lower leg: Edema present.     Left lower leg: Edema present.  Skin:    General: Skin is warm and dry.  Neurological:     General: No focal deficit present.     Mental Status: Mental status is at baseline.    Labs on Admission: I have personally reviewed following labs and imaging studies  CBC: Recent Labs  Lab 01/09/22 1804  WBC 3.8*  NEUTROABS 2.2  HGB 9.4*  HCT 30.9*  MCV 102.3*  PLT 114*    Basic Metabolic Panel: Recent Labs  Lab 01/09/22 1804  NA 142  K 4.1  CL 103  CO2 29  GLUCOSE 88  BUN 17  CREATININE 3.71*  CALCIUM 8.5*    GFR: Estimated Creatinine Clearance: 16.7 mL/min (A) (by C-G formula based on SCr of 3.71 mg/dL (H)).  Liver Function Tests: Recent Labs  Lab 01/09/22 1804  AST 23  ALT 16  ALKPHOS 88  BILITOT 1.0  PROT 7.1  ALBUMIN 3.6    Urine analysis:    Component Value Date/Time   COLORURINE Yellow 09/14/2012 0414   APPEARANCEUR Cloudy 09/14/2012 0414   LABSPEC 1.018 09/14/2012 0414   PHURINE 9.0 09/14/2012 0414   GLUCOSEU 50 mg/dL 09/14/2012 0414   HGBUR 1+ 09/14/2012 0414   BILIRUBINUR Negative  09/14/2012 [place] Negative 09/14/2012  0414   PROTEINUR >=500 09/14/2012 0414   NITRITE Negative 09/14/2012 0414   LEUKOCYTESUR 2+ 09/14/2012 0414    Radiological Exams on Admission: DG Chest Portable 1 View  Result Date: 01/09/2022 CLINICAL DATA:  Shortness of breath EXAM: PORTABLE CHEST 1 VIEW COMPARISON:  12/21/2021, 08/07/2021 FINDINGS: Post sternotomy changes. Cardiomegaly with vascular congestion and mild pulmonary edema. Stent in the left upper extremity. No consolidation or pneumothorax. IMPRESSION: Cardiomegaly with vascular congestion and mild pulmonary edema. Electronically Signed   By: Donavan Foil M.D.   On: 01/09/2022 18:12    EKG: Independently reviewed.  Sinus rhythm at 82 bpm.  Some baseline wander.  PVC noted.  Right bundle branch block.  Similar to previous.  Assessment/Plan Principal Problem:   Acute on chronic combined systolic and diastolic congestive heart failure (HCC) Active Problems:   ESRD on hemodialysis (HCC)   Chronic combined systolic and diastolic CHF (congestive heart failure) (HCC)   Chronic respiratory failure with hypoxia (HCC)   CAD, multiple vessel   Paroxysmal atrial fibrillation (HCC)   Chronic hypotension   Anemia of chronic disease   Hypothyroidism   S/P CABG x 3   Hyperlipidemia   H/O stroke without residual deficits   Hepatitis C antibody positive in blood   Acute on chronic combined systolic and diastolic congestive heart failure > Echo earlier this month with EF 25-30%, G2 DD, moderately reduced RV function. > Presenting with worsening shortness of breath with BNP elevated to 2400.  Fortunately no hypoxia currently however is significantly symptomatic.  Rales on exam, lower extreme edema. > Patient's symptoms worsened after she went to dialysis now unable to pull fluid due to her hypotension. > Patient does not make urine and will need dialysis to remove fluid. > Also noted to have increasing abdomen size likely from ascites which she did have during previous CHF  exacerbation earlier this month as well. - Monitor on progressive unit, due to risk of recurrent hypotension. - Strict I's and O's, daily weights - Trend renal function and electrolytes - Check magnesium - Already had recent echo - Dialysis for volume removal, will need nephrology consult in the morning.  ESRD on HD > As above is ESRD patient on HD was unable to have fluid removed today due to low blood pressure. > Electrolytes and creatinine stable in ED. > Of note, patient states that family is bringing [employer] from outside for her to eat, discouraged patient from consuming significant amount of the salty fast food if any. - Will need nephrology consult in the morning. - Trend renal function and electrolytes - Continue home Sensipar and sevelamer. - Renal diet with fluid restriction  Chronic hypertension > Patient with known history of chronic hypotension on midodrine with HD sessions. > Unable to pull fluid off due to hypotension today as above. > Remains in the 85I systolic in the ED which appears to be near her baseline. - Continue home midodrine, consider increasing frequency of dosing  CAD > Status post CABG - Continue home Ranexa, rosuvastatin - Holding home carvedilol and losartan in the setting of hypotension.  Atrial fibrillation - Continue home Eliquis - Holding home carvedilol in setting of borderline hypotension.  Anemia > Chronic kidney disease.  Hemoglobin stable at 9.4 in ED. - Trend CBC  Hypothyroidism - Continue home Synthroid  History of CVA Hyperlipidemia - Continue home rosuvastatin  DVT prophylaxis: Eliquis Code Status:   Full Family Communication:  None on  admission, states family is aware of admission  Disposition Plan:   Patient is from:  Home  Anticipated DC to:  Home  Anticipated DC date:  1 to 5 days  Anticipated DC barriers: None  Consults called:  None.  Nephrology will need to be consulted in the morning. Admission status:   Observation, progressive  Severity of Illness: The appropriate patient status for this patient is OBSERVATION. Observation status is judged to be reasonable and necessary in order to provide the required intensity of service to ensure the patient's safety. The patient's presenting symptoms, physical exam findings, and initial radiographic and laboratory data in the context of their medical condition is felt to place them at decreased risk for further clinical deterioration. Furthermore, it is anticipated that the patient will be medically stable for discharge from the hospital within 2 midnights of admission.    Marcelyn Bruins MD [facility]  How to contact the [facility] Attending or Consulting provider [place] or covering provider during after hours [place], for this patient?   Check the care team in [facility] and look for a) attending/consulting [facility] provider listed and b) the [facility] team listed Log into [email or web address] and use [place]'s universal password to access. If you do not have the password, please contact the hospital operator. Locate the [facility] provider you are looking for under [facility] and page to a number that you can be directly reached. If you still have difficulty reaching the provider, please page the [facility] (Director on Call) for the Hospitalists listed on amion for assistance.  01/09/2022, 8:44 PM

## 2022-01-09 NOTE — ED Provider Notes (Signed)
[facility] Provider Note    Event Date/Time   First MD Initiated Contact with Patient 01/09/22 1746     (approximate)   History   Shortness of Breath   HPI  [name] is a 61 y.o. female past medical history of end-stage renal disease on HD, chronic respiratory failure A-fib on Eliquis, coronary disease chronic hypotension on midodrine who presents with shortness of breath.  Patient was at dialysis today apparently she was hypotensive and so they were not able to do ultrafiltration but she did have a normal session otherwise.  She went home and felt short of breath.  Has been short of breath over the last several days has cough is nonproductive denies fevers chills also chest pain with deep inspiration.  She notes that her abdomen is increasing in size.     Past Medical History:  Diagnosis Date   Adult behavior problems    frontal lobe CVA   Anemia    chronic disease   Cardiomyopathy ([place])    CHF (congestive heart failure) (HCC)    Chicken pox    Coronary artery disease    GERD (gastroesophageal reflux disease)    Heart failure (HCC)    Hepatitis    history of hep c   History of bipolar disorder    Hyperlipidemia    Hypertension    Myocardial infarction [facility])    Obesity    Paroxysmal atrial fibrillation ([place])    Peripheral vascular disease ([place])    Renal failure    Renal insufficiency    Stroke ([place]) 2011   Superficial incisional surgical site infection 03/14/2014    Patient Active Problem List   Diagnosis Date Noted   Chronic combined systolic and diastolic CHF (congestive heart failure) ([place]) 12/19/2021   Ascites 12/19/2021   Chronic respiratory failure with hypoxia ([place]) 12/19/2021   Hepatitis C antibody positive in blood 12/19/2021   Thrombocytopenia ([place]) 08/08/2021   Paroxysmal atrial fibrillation ([place])    Hypothyroidism    Syncope and collapse 07/25/2020   ESRD (end stage renal disease) ([place]) 01/30/2019   Unstable  angina ([place]) 01/23/2019   Swelling of limb 09/22/2016   Kidney dialysis as the cause of abnormal reaction of the patient, or of later complication, without mention of misadventure at the time of the procedure (CODE) 03/24/2016   Hyperkalemia 11/13/2014   Generalized weakness 10/15/2014   ESRD on hemodialysis ([place]) 10/15/2014   Congestive heart failure (CHF) ([place]) 10/15/2014   Chronic hypotension 10/15/2014   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   Pericardial effusion 04/12/2014   History of delirium 04/12/2014   H/O atrial flutter 04/12/2014   Delayed surgical wound healing 04/12/2014   Chest wall pain following surgery 04/12/2014   S/P CABG (coronary artery bypass graft) 04/02/2014   Lactic acidosis 03/26/2014   Arteriosclerotic dementia ([place]) 03/25/2014   Atrial flutter by electrocardiogram ([place]) 03/22/2014   Superficial incisional surgical site infection 03/14/2014   Postoperative anemia due to acute blood loss 02/27/2014   H/O: substance abuse ([place]) 02/27/2014   Anemia of chronic renal failure 02/27/2014   Leukocytosis 02/26/2014   Hypotension 02/24/2014   Exhausted vascular access 02/24/2014   Anemia of chronic disease 02/24/2014   Stroke ([place]) 02/21/2014   S/P CABG x 3 02/21/2014   Dialysis patient ([place]) 02/21/2014   Hyperlipidemia 02/13/2014   History of hepatitis C 02/13/2014   HFrEF (heart failure with reduced ejection fraction) ([place]) 02/13/2014   H/O stroke without residual deficits 02/13/2014  ESRD (end stage renal disease) on dialysis ([place]) 02/13/2014   End stage renal disease with hypertension ([place]) 02/13/2014   CAD, multiple vessel 02/13/2014   Retrosternal chest pain 05/30/2013   Gallstones 05/29/2013     Physical Exam  Triage Vital Signs: ED Triage Vitals  Enc Vitals Group     BP 01/09/22 1751 100/78     Pulse Rate 01/09/22 1751 82     Resp 01/09/22 1751 20     Temp 01/09/22 1808 98.1 F (36.7 C)     Temp Source 01/09/22 1808 Oral     SpO2  01/09/22 1751 96 %     Weight 01/09/22 1752 160 lb (72.6 kg)     Height 01/09/22 1752 '5\' 6"'$  (1.676 m)     Head Circumference --      Peak Flow --      Pain Score 01/09/22 1751 8     Pain Loc --      Pain Edu? --      Excl. in [place]? --     Most recent vital signs: Vitals:   01/09/22 1830 01/09/22 1900  BP: 109/70 107/70  Pulse: 82 80  Resp: 14 13  Temp:    SpO2: 100% 99%     General: Awake, no distress.  CV:  Good peripheral perfusion.  Pitting edema bilateral lower extremities Resp:  Normal effort.  Crackles, diminished breath sounds Abd:  Abdomen is significantly distended but nontender Neuro:             Awake, Alert, Oriented x 3  Other:     ED Results / Procedures / Treatments  Labs (all labs ordered are listed, but only abnormal results are displayed) Labs Reviewed  COMPREHENSIVE METABOLIC PANEL - Abnormal; Notable for the following components:      Result Value   Creatinine, Ser 3.71 (*)    Calcium 8.5 (*)    GFR, Estimated 13 (*)    All other components within normal limits  BRAIN NATRIURETIC PEPTIDE - Abnormal; Notable for the following components:   B Natriuretic Peptide 2,400.6 (*)    All other components within normal limits  CBC WITH DIFFERENTIAL/PLATELET - Abnormal; Notable for the following components:   WBC 3.8 (*)    RBC 3.02 (*)    Hemoglobin 9.4 (*)    HCT 30.9 (*)    MCV 102.3 (*)    RDW 16.4 (*)    Platelets 114 (*)    All other components within normal limits  TROPONIN I (HIGH SENSITIVITY) - Abnormal; Notable for the following components:   Troponin I (High Sensitivity) 37 (*)    All other components within normal limits  TROPONIN I (HIGH SENSITIVITY)     EKG  EKG interpreted by myself shows sinus rhythm with prolonged PR interval right bundle branch block with repolarization abnormality no acute ischemic changes   RADIOLOGY Chest x-ray interpreted myself shows cardiomegaly and vascular congestion   PROCEDURES:  Critical Care  performed: No  .1-3 Lead EKG Interpretation  Performed by: Rada Hay, MD Authorized by: Rada Hay, MD     Interpretation: normal     ECG rate assessment: normal     Rhythm: sinus rhythm     Ectopy: none     Conduction: normal     The patient is on the cardiac monitor to evaluate for evidence of arrhythmia and/or significant heart rate changes.   MEDICATIONS ORDERED IN ED: Medications - No data to display   IMPRESSION /  MDM / ASSESSMENT AND PLAN / ED COURSE  I reviewed the triage vital signs and the nursing notes.                              Patient's presentation is most consistent with acute presentation with potential threat to life or bodily function.  Differential diagnosis includes, but is not limited to, pulmonary edema, pneumonia, acute coronary syndrome, ascites related dyspnea  Patient is a 61 year old female with multiple chronic comorbidities including end-stage renal disease presents with shortness of breath.  Apparently she was hypotensive at dialysis today unable to get ultrafiltration.  She went home and felt increasingly short of breath has had mild cough no sputum production no fevers does have chest pain with deep inspiration.  She is on Eliquis for history of A-fib.  Blood pressures on the soft side she is satting well on no oxygen heart and respiratory rate are normal.  Her abdomen is significantly distended suspect from ascites.  She does sound congested and appears volume overloaded on exam.  Chest x-ray showing cardiomegaly with vascular congestion similar to prior.  BMP stably elevated troponin also stably elevated EKG showing a right bundle without acute ischemic change.  Patient's blood pressure is on the soft side.  Because they were not able to take fluid off of her and she is not going to have dialysis until Monday do think that she should be observed in the hospital and have dialysis sooner.  Midodrine may need to be uptitrated so that she  can have more fluid pulled off.       FINAL CLINICAL IMPRESSION(S) / ED DIAGNOSES   Final diagnoses:  SOB (shortness of breath)     Rx / DC Orders   ED Discharge Orders     None        Note:  This document was prepared using Dragon voice recognition software and may include unintentional dictation errors.   Rada Hay, MD 01/09/22 [phone]

## 2022-01-09 NOTE — ED Notes (Signed)
Wrote to provider to notify of BNP 2400.

## 2022-01-09 NOTE — ED Triage Notes (Signed)
Pt to ED [facility]  BP was SBP 70 when went to dialysis this AM, changing story by patient, first told EMS that did not get dialysis,, now saying did get dialysis with 1.8L removed. Pt left dialysis center with SBP of 90.  Chronic 2L at home, was 96% on RA fpr EMS, placed back on 2L for comfort  Ems vs: 108/61, HR 82. Others normal.  Pt also complains of generalized body pains. Pt states is SOB but does not appear labored at this time.

## 2022-01-10 DIAGNOSIS — R188 Other ascites: Secondary | ICD-10-CM | POA: Diagnosis present

## 2022-01-10 DIAGNOSIS — I252 Old myocardial infarction: Secondary | ICD-10-CM | POA: Diagnosis not present

## 2022-01-10 DIAGNOSIS — I2510 Atherosclerotic heart disease of native coronary artery without angina pectoris: Secondary | ICD-10-CM | POA: Diagnosis present

## 2022-01-10 DIAGNOSIS — Z885 Allergy status to narcotic agent status: Secondary | ICD-10-CM | POA: Diagnosis not present

## 2022-01-10 DIAGNOSIS — N186 End stage renal disease: Secondary | ICD-10-CM | POA: Diagnosis present

## 2022-01-10 DIAGNOSIS — E785 Hyperlipidemia, unspecified: Secondary | ICD-10-CM | POA: Diagnosis present

## 2022-01-10 DIAGNOSIS — I480 Paroxysmal atrial fibrillation: Secondary | ICD-10-CM | POA: Diagnosis present

## 2022-01-10 DIAGNOSIS — Z881 Allergy status to other antibiotic agents status: Secondary | ICD-10-CM | POA: Diagnosis not present

## 2022-01-10 DIAGNOSIS — Z91048 Other nonmedicinal substance allergy status: Secondary | ICD-10-CM | POA: Diagnosis not present

## 2022-01-10 DIAGNOSIS — B1920 Unspecified viral hepatitis C without hepatic coma: Secondary | ICD-10-CM | POA: Diagnosis present

## 2022-01-10 DIAGNOSIS — J9611 Chronic respiratory failure with hypoxia: Secondary | ICD-10-CM | POA: Diagnosis present

## 2022-01-10 DIAGNOSIS — Z8673 Personal history of transient ischemic attack (TIA), and cerebral infarction without residual deficits: Secondary | ICD-10-CM | POA: Diagnosis not present

## 2022-01-10 DIAGNOSIS — Z992 Dependence on renal dialysis: Secondary | ICD-10-CM | POA: Diagnosis not present

## 2022-01-10 DIAGNOSIS — Z91040 Latex allergy status: Secondary | ICD-10-CM | POA: Diagnosis not present

## 2022-01-10 DIAGNOSIS — I132 Hypertensive heart and chronic kidney disease with heart failure and with stage 5 chronic kidney disease, or end stage renal disease: Secondary | ICD-10-CM | POA: Diagnosis present

## 2022-01-10 DIAGNOSIS — K219 Gastro-esophageal reflux disease without esophagitis: Secondary | ICD-10-CM | POA: Diagnosis present

## 2022-01-10 DIAGNOSIS — I5042 Chronic combined systolic (congestive) and diastolic (congestive) heart failure: Secondary | ICD-10-CM | POA: Diagnosis not present

## 2022-01-10 DIAGNOSIS — I5043 Acute on chronic combined systolic (congestive) and diastolic (congestive) heart failure: Secondary | ICD-10-CM | POA: Diagnosis present

## 2022-01-10 DIAGNOSIS — E039 Hypothyroidism, unspecified: Secondary | ICD-10-CM | POA: Diagnosis present

## 2022-01-10 DIAGNOSIS — I9589 Other hypotension: Secondary | ICD-10-CM | POA: Diagnosis present

## 2022-01-10 DIAGNOSIS — Z951 Presence of aortocoronary bypass graft: Secondary | ICD-10-CM | POA: Diagnosis not present

## 2022-01-10 DIAGNOSIS — D631 Anemia in chronic kidney disease: Secondary | ICD-10-CM | POA: Diagnosis present

## 2022-01-10 DIAGNOSIS — I739 Peripheral vascular disease, unspecified: Secondary | ICD-10-CM | POA: Diagnosis present

## 2022-01-10 DIAGNOSIS — R0602 Shortness of breath: Secondary | ICD-10-CM | POA: Diagnosis present

## 2022-01-10 DIAGNOSIS — N2581 Secondary hyperparathyroidism of renal origin: Secondary | ICD-10-CM | POA: Diagnosis present

## 2022-01-10 DIAGNOSIS — E8770 Fluid overload, unspecified: Secondary | ICD-10-CM | POA: Diagnosis not present

## 2022-01-10 DIAGNOSIS — I429 Cardiomyopathy, unspecified: Secondary | ICD-10-CM | POA: Diagnosis present

## 2022-01-10 DIAGNOSIS — E875 Hyperkalemia: Secondary | ICD-10-CM | POA: Diagnosis not present

## 2022-01-10 LAB — COMPREHENSIVE METABOLIC PANEL
ALT: 14 U/L (ref 0–44)
AST: 19 U/L (ref 15–41)
Albumin: 3.4 g/dL — ABNORMAL LOW (ref 3.5–5.0)
Alkaline Phosphatase: 84 U/L (ref 38–126)
Anion gap: 9 (ref 5–15)
BUN: 20 mg/dL (ref 6–20)
CO2: 27 mmol/L (ref 22–32)
Calcium: 8.6 mg/dL — ABNORMAL LOW (ref 8.9–10.3)
Chloride: 105 mmol/L (ref 98–111)
Creatinine, Ser: 4.69 mg/dL — ABNORMAL HIGH (ref 0.44–1.00)
GFR, Estimated: 10 mL/min — ABNORMAL LOW (ref >60)
Glucose, Bld: 87 mg/dL (ref 70–99)
Potassium: 4 mmol/L (ref 3.5–5.1)
Sodium: 141 mmol/L (ref 135–145)
Total Bilirubin: 1 mg/dL (ref 0.3–1.2)
Total Protein: 6.9 g/dL (ref 6.5–8.1)

## 2022-01-10 LAB — CBC
HCT: 30.5 % — ABNORMAL LOW (ref 36.0–46.0)
Hemoglobin: 9.5 g/dL — ABNORMAL LOW (ref 12.0–15.0)
MCH: 31.6 pg (ref 26.0–34.0)
MCHC: 31.1 g/dL (ref 30.0–36.0)
MCV: 101.3 fL — ABNORMAL HIGH (ref 80.0–100.0)
Platelets: 106 10*3/uL — ABNORMAL LOW (ref 150–400)
RBC: 3.01 MIL/uL — ABNORMAL LOW (ref 3.87–5.11)
RDW: 16.5 % — ABNORMAL HIGH (ref 11.5–15.5)
WBC: 3.6 10*3/uL — ABNORMAL LOW (ref 4.0–10.5)
nRBC: 0 % (ref 0.0–0.2)

## 2022-01-10 LAB — PHOSPHORUS: Phosphorus: 2.7 mg/dL (ref 2.5–4.6)

## 2022-01-10 LAB — GLUCOSE, CAPILLARY: Glucose-Capillary: 95 mg/dL (ref 70–99)

## 2022-01-10 MED ORDER — ALBUMIN HUMAN 25 % IV SOLN
INTRAVENOUS | Status: AC
  Filled 2022-01-10: qty 50

## 2022-01-10 MED ORDER — ALBUMIN HUMAN 25 % IV SOLN
INTRAVENOUS | Status: AC
  Administered 2022-01-10: 25 g via INTRAVENOUS
  Filled 2022-01-10: qty 50

## 2022-01-10 MED ORDER — LIDOCAINE-PRILOCAINE 2.5-2.5 % EX CREA: 1.0000 | TOPICAL_CREAM | CUTANEOUS | Status: DC | PRN

## 2022-01-10 MED ORDER — MIDODRINE HCL 5 MG PO TABS
10.0000 mg | ORAL_TABLET | Freq: Three times a day (TID) | ORAL | Status: DC
  Administered 2022-01-10 – 2022-01-12 (×5): 10 mg via ORAL
  Filled 2022-01-10 (×5): qty 2

## 2022-01-10 MED ORDER — LIDOCAINE HCL (PF) 1 % IJ SOLN
5.0000 mL | INTRAMUSCULAR | Status: DC | PRN
Start: 2022-01-10 — End: 2022-01-12

## 2022-01-10 MED ORDER — ALBUMIN HUMAN 25 % IV SOLN
25.0000 g | Freq: Once | INTRAVENOUS | Status: AC
  Filled 2022-01-10: qty 100

## 2022-01-10 MED ORDER — PENTAFLUOROPROP-TETRAFLUOROETH EX AERO
1.0000 | INHALATION_SPRAY | CUTANEOUS | Status: DC | PRN
  Filled 2022-01-10: qty 30

## 2022-01-10 MED ORDER — PENTAFLUOROPROP-TETRAFLUOROETH EX AERO
INHALATION_SPRAY | CUTANEOUS | Status: AC
  Administered 2022-01-10: 1 via TOPICAL
  Filled 2022-01-10: qty 30

## 2022-01-10 MED ORDER — CHLORHEXIDINE GLUCONATE CLOTH 2 % EX PADS
6.0000 | MEDICATED_PAD | Freq: Every day | CUTANEOUS | Status: DC
Start: 2022-01-10 — End: 2022-01-12
  Administered 2022-01-11 – 2022-01-12 (×2): 6 via TOPICAL
  Filled 2022-01-10: qty 6

## 2022-01-10 NOTE — Assessment & Plan Note (Addendum)
   Paracentesis ordered, should be able to be performed tomorrow (IR not here over the weekend)

## 2022-01-10 NOTE — Progress Notes (Signed)
Pt HD end, goal of 2L fluid removal met, albumin given to support sbp during tx, pt alert, no c/o, stable, report to primary RN, ccmd notified, 45L BVP. Post standing weight 74.6 kg.

## 2022-01-10 NOTE — Progress Notes (Signed)
Pt reminded to try to keep access arm immobile during HD. Pt acknowledge. Currently resting comfortably.

## 2022-01-10 NOTE — Assessment & Plan Note (Signed)
   Continue home Ranexa, rosuvastatin  Holding home carvedilol and losartan in the setting of hypotension.

## 2022-01-10 NOTE — Assessment & Plan Note (Signed)
Nephrology following. 

## 2022-01-10 NOTE — Assessment & Plan Note (Signed)
   Cont midodrine

## 2022-01-10 NOTE — Progress Notes (Signed)
Post HD RN assessment 

## 2022-01-10 NOTE — Progress Notes (Signed)
Patient states some anxiety. Hydroxyzine  PRN administered as prescribed

## 2022-01-10 NOTE — Hospital Course (Addendum)
Calysta Craigo Riendeau is a 61 y.o. female with medical history significant of CHF, ESRD on HD (does not make urine), hypertension, hepatitis C status posttreatment, chronic respiratory failure with hypoxia, CAD status post CABG, atrial fibrillation on Eliquis, anemia, hypothyroidism, stroke, hyperlipidemia, pericardial effusion status post pericardiotomy presenting to ED 01/09/22 with shortness of breath x several days w/ cough and pleuritic CP, unable to pull fluid at HD on day of admission d/t SOB. Recent prior hospital admission for ascites w/ paracentesis 12/20/2021.  07/28: ED - BNP 2400, CXR cardiomegaly with vascular congestion and mild pulmonary edema. Admitted to hospitalist service for Acute on chronic combined systolic and diastolic congestive heart failure. Echo earlier this month with EF 25-30%, G2 DD, moderately reduced RV function. Hypotensive at baseline, on midodrine. Held home CAD Rx (BB, ARB). Abd [place] (+) mod/severe ascites.  07/29: nephrology following. Initial plan was paracentesis then HD but IR did not receive order so paracentesis was held up, ok for patient to receive HD in the meantime. HD removed 2L.  07/30: pending paracentesis, no IR over the weekend, waiting until tomorrow.  Progression goals: Nephrology recs - dialysis hopefully will improve CHF/symptoms  Paracentesis ordered - hopefully will improve CHF/symptoms

## 2022-01-10 NOTE — Progress Notes (Signed)
Patient started complaining of shortness of breath at 10pm. Last vital signs below. Slight perspiration and asked to turn the air up. Checked Blood sugar and it was 95. Patient improved after oxygen was increased. Currently at 6L. Patient feels better with room temperature at 70 but still has shortness of breath. No chest pain but states generalized pain. Will inform NP about changes in status.  01/10/22 1942  Vitals  Temp 98.2 F (36.8 C)  BP 100/70  MAP (mmHg) 81  BP Location Right Arm  BP Method Automatic  Patient Position (if appropriate) Sitting  Pulse Rate 74  Pulse Rate Source Monitor  Resp 17  MEWS COLOR  MEWS Score Color Green  Oxygen Therapy  SpO2 100 %  O2 Device Nasal Cannula  MEWS Score  MEWS Temp 0  MEWS Systolic 1  MEWS Pulse 0  MEWS RR 0  MEWS LOC 0  MEWS Score 1

## 2022-01-10 NOTE — Assessment & Plan Note (Signed)
   Cont home statin and plavix

## 2022-01-10 NOTE — Progress Notes (Signed)
[facility] [place], [place] 01/10/22  Subjective:   LOS: 0  Patient known to our practice from outpatient dialysis.  She currently dialyzes at [facility] dialysis on MWF first shift.  Last hemodialysis was July 28 which is yesterday.  Patient presents for several days of worsening shortness of breath.  She feels like ascites has worsened.  She has chronic ascites and periodically gets paracentesis.  She has developed some lower extremity edema also.   Objective:  Vital signs in last 24 hours:  Temp:  [97.8 F (36.6 C)-98.2 F (36.8 C)] 97.8 F (36.6 C) (07/29 0759) Pulse Rate:  [56-87] 77 (07/29 0733) Resp:  [13-27] 27 (07/29 0733) BP: (91-164)/(59-84) 164/81 (07/29 0733) SpO2:  [92 %-100 %] 100 % (07/29 0733) Weight:  [72.6 kg] 72.6 kg (07/28 1752)  Weight change:  Filed Weights   01/09/22 1752  Weight: 72.6 kg    Intake/Output:    Intake/Output Summary (Last 24 hours) at 01/10/2022 1050 Last data filed at 01/10/2022 0500 Gross per 24 hour  Intake 120 ml  Output --  Net 120 ml     Physical Exam: General: Laying in the bed, no acute distress  HEENT Anicteric, moist oral mucous membranes  Pulm/lungs Mild crackles at lung bases otherwise clear  CVS/Heart Regular  Abdomen:  Distended, ascites present  Extremities: 1-2+ pitting edema  Neurologic: Alert, oriented  Skin: Warm, dry  Access: Left upper extremity AV graft.       Basic Metabolic Panel:  Recent Labs  Lab 01/09/22 1804 01/10/22 0634  NA 142 141  K 4.1 4.0  CL 103 105  CO2 29 27  GLUCOSE 88 87  BUN 17 20  CREATININE 3.71* 4.69*  CALCIUM 8.5* 8.6*  MG 2.3  --      CBC: Recent Labs  Lab 01/09/22 1804 01/10/22 0634  WBC 3.8* 3.6*  NEUTROABS 2.2  --   HGB 9.4* 9.5*  HCT 30.9* 30.5*  MCV 102.3* 101.3*  PLT 114* 106*      Lab Results  Component Value Date   HEPBSAG NON REACTIVE 12/19/2021   HEPBSAB NON REACTIVE 12/19/2021   HEPBIGM Negative 01/18/2019       Microbiology:  No results found for this or any previous visit (from the past 240 hour(s)).  Coagulation Studies: No results for input(s): "LABPROT", "INR" in the last 72 hours.  Urinalysis: No results for input(s): "COLORURINE", "LABSPEC", "PHURINE", "GLUCOSEU", "HGBUR", "BILIRUBINUR", "KETONESUR", "PROTEINUR", "UROBILINOGEN", "NITRITE", "LEUKOCYTESUR" in the last 72 hours.  Invalid input(s): "APPERANCEUR"    Imaging: [place] ASCITES (ABDOMEN LIMITED)  Result Date: 01/09/2022 CLINICAL DATA:  Abdominal distension EXAM: LIMITED ABDOMEN ULTRASOUND FOR ASCITES TECHNIQUE: Limited ultrasound survey for ascites was performed in all four abdominal quadrants. COMPARISON:  None Available. FINDINGS: Scanning was performed in all 4 quadrants and demonstrates moderate to severe ascites throughout the abdomen. IMPRESSION: Moderate to severe abdominal ascites. Electronically Signed   By: Inez Catalina M.D.   On: 01/09/2022 22:30   DG Chest Portable 1 View  Result Date: 01/09/2022 CLINICAL DATA:  Shortness of breath EXAM: PORTABLE CHEST 1 VIEW COMPARISON:  12/21/2021, 08/07/2021 FINDINGS: Post sternotomy changes. Cardiomegaly with vascular congestion and mild pulmonary edema. Stent in the left upper extremity. No consolidation or pneumothorax. IMPRESSION: Cardiomegaly with vascular congestion and mild pulmonary edema. Electronically Signed   By: Donavan Foil M.D.   On: 01/09/2022 18:12     Medications:    albumin human      amiodarone  200  mg Oral Daily   apixaban  5 mg Oral BID   Chlorhexidine Gluconate Cloth  6 each Topical Q0600   cinacalcet  90 mg Oral Q breakfast   levothyroxine  75 mcg Oral QAC breakfast   [START ON 01/12/2022] midodrine  10 mg Oral Once per day on Mon Wed Fri   pantoprazole  40 mg Oral Daily   ranolazine  1,000 mg Oral BID   rosuvastatin  20 mg Oral QHS   sevelamer carbonate  3,200 mg Oral TID WC   sodium chloride flush  3 mL Intravenous Q12H   acetaminophen **OR**  acetaminophen, hydrOXYzine, lidocaine (PF), lidocaine-prilocaine, oxyCODONE-acetaminophen, pentafluoroprop-tetrafluoroeth, polyethylene glycol  Assessment/ Plan:  61 y.o. female with coronary artery disease, history of coronary artery disease status post CABG 2015, congestive heart failure, hypertension, stroke, end-stage renal disease, hepatitis C posttreatment, atrial fibrillation, anemia, hypothyroidism, history of pericardial effusion  was admitted on 01/09/2022 for  Principal Problem:   Acute on chronic combined systolic and diastolic congestive heart failure (HCC) Active Problems:   ESRD on hemodialysis (HCC)   Chronic hypotension   S/P CABG x 3   Hyperlipidemia   H/O stroke without residual deficits   CAD, multiple vessel   Anemia of chronic disease   Paroxysmal atrial fibrillation (HCC)   Hypothyroidism   Chronic combined systolic and diastolic CHF (congestive heart failure) (HCC)   Chronic respiratory failure with hypoxia (HCC)   Hepatitis C antibody positive in blood  Acute on chronic combined systolic and diastolic congestive heart failure [facility]) [I50.43]  Outpatient dialysis: [facility].  MWF 1.  Left arm AV graft.  Target weight 70.5 kg.  195 minutes.  #. ESRD Patient presents with significant volume overload.  She is scheduled to undergo paracentesis today.  We plan to dialyze her as UF only treatment for volume removal of about 2 L as tolerated.  This will be done after paracentesis is completed.  Oncotic support with IV albumin. Discussed with patient about potential peritoneal dialysis catheter placement.  She will think about it and get back with [place].  #. Anemia of CKD  Lab Results  Component Value Date   HGB 9.5 (L) 01/10/2022   Low dose EPO with HD  #. Secondary hyperparathyroidism of renal origin N 25.81      Component Value Date/Time   PTH 627 (H) 07/26/2020 0459   Lab Results  Component Value Date   PHOS 3.2 12/22/2021   Monitor calcium  and phos level during this admission  # Chronic systolic + d LXB-2I echo from December 22, 2021-LVEF 25 to 30%, left ventricle has severely decreased function, global hypokinesis, mild LVH, grade 2 diastolic dysfunction, moderately reduced right ventricular systolic function, left atrium severely dilated, right atrium severely dilated, mild mitral valve regurgitation, moderate to severe tricuspid regurgitation   LOS: 0 Kajsa Butrum Candiss Norse 7/29/202310:50 AM  [facility] [place], [place]

## 2022-01-10 NOTE — Assessment & Plan Note (Signed)
   Cont home synthroid

## 2022-01-10 NOTE — Progress Notes (Signed)
PROGRESS NOTE    [name]  WPY:[id number] DOB: Mar 26, 1961  DOA: 01/09/2022 Date of Service: 01/10/22 PCP: [name], MD     Brief Narrative / Hospital Course:  [name] is a 61 y.o. female with medical history significant of CHF, ESRD on HD (does not make urine), hypertension, hepatitis C status posttreatment, chronic respiratory failure with hypoxia, CAD status post CABG, atrial fibrillation on Eliquis, anemia, hypothyroidism, stroke, hyperlipidemia, pericardial effusion status post pericardiotomy presenting to ED 01/09/22 with shortness of breath x several days w/ cough and pleuritic CP, unable to pull fluid at HD on day of admission d/t SOB. Recent prior hospital admission for ascites w/ paracentesis 12/20/2021.  07/28: ED - BNP 2400, CXR cardiomegaly with vascular congestion and mild pulmonary edema. Admitted to hospitalist service for Acute on chronic combined systolic and diastolic congestive heart failure. Echo earlier this month with EF 25-30%, G2 DD, moderately reduced RV function. Hypotensive at baseline, on midodrine. Held home CAD Rx (BB, ARB). Abd [place] (+) mod/severe ascites.  07/29: nephrology following. Initial plan was paracentesis then HD but IR did not receive order so paracentesis was held up, ok for patient to receive HD in the meantime.   Progression goals:  Nephrology res - dialysis hopefully will improve CHF Paracentesis ordered Pending clinical improvement may need to make inpatient from observation      Consultants:  Nephrology Interventional radiology   Procedures: [Planned paracentesis and dialysis]    Subjective: Patient reports SOB and abdominal distension are bothersome but stable. No other complaints.      ASSESSMENT & PLAN:   Principal Problem:   Acute on chronic combined systolic and diastolic congestive heart failure (HCC) Active Problems:   Ascites   ESRD on hemodialysis (HCC)   Chronic combined systolic and  diastolic CHF (congestive heart failure) (HCC)   Chronic respiratory failure with hypoxia (HCC)   Paroxysmal atrial fibrillation (HCC)   Chronic hypotension   Anemia of chronic disease   Hypothyroidism   S/P CABG x 3   Hyperlipidemia   H/O stroke without residual deficits   Hepatitis C antibody positive in blood   Acute on chronic combined systolic and diastolic congestive heart failure (HCC) Exacerbation now is likely due to unable to pull fluid at recent dialysis d/t hypotension Dialysis - nephrology consulted, pt does not make urine  Close monitor BP - chronic/recurrent hypotension Had echo recently - will not repeat  Monitor BMP, Mag  ESRD on hemodialysis [facility]) Nephrology following   Ascites Paracentesis ordered  S/P CABG x 3 Continue home Ranexa, rosuvastatin Holding home carvedilol and losartan in the setting of hypotension.  Paroxysmal atrial fibrillation (HCC) Holding carvedilol d/t hypotension - monitor on tele for rebound tachycardia Cont home amiodarone and eliquis   Hypothyroidism Cone home synthroid   Hyperlipidemia Cone home statin   H/O stroke without residual deficits Cont home statin and plavix   Chronic hypotension Cont midodrine    DVT prophylaxis: Eliquis  Code Status: FULL Family Communication: declined call to family at this time  Disposition Plan / TOC needs: inpatient  Barriers to discharge / significant pending items: paracentesis, dialysis pending              Objective: Vitals:   01/10/22 1130 01/10/22 1200 01/10/22 1230 01/10/22 1300  BP: (!) 76/47 (!) 87/64 (!) 85/60 (!) 81/55  Pulse: 76 77 77 76  Resp: 13 (!) 23 (!) 21 13  Temp:   97.7 F (36.5 C)  TempSrc:      SpO2: 100% 100% 100% 100%  Weight:      Height:        Intake/Output Summary (Last 24 hours) at 01/10/2022 1355 Last data filed at 01/10/2022 0500 Gross per 24 hour  Intake 120 ml  Output --  Net 120 ml   Filed Weights   01/09/22 1752  Weight:  72.6 kg    Examination:  Constitutional:  VS as above General Appearance: alert, well-developed, well-nourished, NAD Eyes: Normal lids and conjunctive, non-icteric sclera Ears, Nose, Mouth, Throat: Normal appearance Neck: No masses, trachea midline Respiratory: Normal respiratory effort Breath sounds (+) diffuse rales Cardiovascular: S1/S2 normal (+)JVD No lower extremity edema Gastrointestinal: Nontender Distended  Musculoskeletal:  No clubbing/cyanosis of digits Neurological: No cranial nerve deficit on limited exam Psychiatric: Normal judgment/insight Normal mood and affect       Scheduled Medications:   amiodarone  200 mg Oral Daily   apixaban  5 mg Oral BID   Chlorhexidine Gluconate Cloth  6 each Topical Q0600   cinacalcet  90 mg Oral Q breakfast   levothyroxine  75 mcg Oral QAC breakfast   midodrine  10 mg Oral TID WC   pantoprazole  40 mg Oral Daily   ranolazine  1,000 mg Oral BID   rosuvastatin  20 mg Oral QHS   sevelamer carbonate  3,200 mg Oral TID WC   sodium chloride flush  3 mL Intravenous Q12H    Continuous Infusions:  albumin human      PRN Medications:  acetaminophen **OR** acetaminophen, hydrOXYzine, lidocaine (PF), lidocaine-prilocaine, oxyCODONE-acetaminophen, pentafluoroprop-tetrafluoroeth, polyethylene glycol  Antimicrobials:  Anti-infectives (From admission, onward)    None       Data Reviewed: I have personally reviewed following labs and imaging studies  CBC: Recent Labs  Lab 01/09/22 1804 01/10/22 0634  WBC 3.8* 3.6*  NEUTROABS 2.2  --   HGB 9.4* 9.5*  HCT 30.9* 30.5*  MCV 102.3* 101.3*  PLT 114* 810*   Basic Metabolic Panel: Recent Labs  Lab 01/09/22 1804 01/10/22 0634 01/10/22 0834  NA 142 141  --   K 4.1 4.0  --   CL 103 105  --   CO2 29 27  --   GLUCOSE 88 87  --   BUN 17 20  --   CREATININE 3.71* 4.69*  --   CALCIUM 8.5* 8.6*  --   MG 2.3  --   --   PHOS  --   --  2.7   GFR: Estimated  Creatinine Clearance: 13.2 mL/min (A) (by C-G formula based on SCr of 4.69 mg/dL (H)). Liver Function Tests: Recent Labs  Lab 01/09/22 1804 01/10/22 0634  AST 23 19  ALT 16 14  ALKPHOS 88 84  BILITOT 1.0 1.0  PROT 7.1 6.9  ALBUMIN 3.6 3.4*   No results for input(s): "LIPASE", "AMYLASE" in the last 168 hours. No results for input(s): "AMMONIA" in the last 168 hours. Coagulation Profile: No results for input(s): "INR", "PROTIME" in the last 168 hours. Cardiac Enzymes: No results for input(s): "CKTOTAL", "CKMB", "CKMBINDEX", "TROPONINI" in the last 168 hours. BNP (last 3 results) No results for input(s): "PROBNP" in the last 8760 hours. HbA1C: No results for input(s): "HGBA1C" in the last 72 hours. CBG: No results for input(s): "GLUCAP" in the last 168 hours. Lipid Profile: No results for input(s): "CHOL", "HDL", "LDLCALC", "TRIG", "CHOLHDL", "LDLDIRECT" in the last 72 hours. Thyroid Function Tests: No results for input(s): "TSH", "T4TOTAL", "FREET4", "T3FREE", "THYROIDAB"  in the last 72 hours. Anemia Panel: No results for input(s): "VITAMINB12", "FOLATE", "FERRITIN", "TIBC", "IRON", "RETICCTPCT" in the last 72 hours. Urine analysis:    Component Value Date/Time   COLORURINE Yellow 09/14/2012 0414   APPEARANCEUR Cloudy 09/14/2012 0414   LABSPEC 1.018 09/14/2012 0414   PHURINE 9.0 09/14/2012 0414   GLUCOSEU 50 mg/dL 09/14/2012 0414   HGBUR 1+ 09/14/2012 0414   BILIRUBINUR Negative 09/14/2012 0414   KETONESUR Negative 09/14/2012 0414   PROTEINUR >=500 09/14/2012 0414   NITRITE Negative 09/14/2012 0414   LEUKOCYTESUR 2+ 09/14/2012 0414   Sepsis Labs: '@LABRCNTIP'$ (procalcitonin:4,lacticidven:4)  No results found for this or any previous visit (from the past 240 hour(s)).       Radiology Studies last 96 hours: [place] ASCITES (ABDOMEN LIMITED)  Result Date: 01/09/2022 CLINICAL DATA:  Abdominal distension EXAM: LIMITED ABDOMEN ULTRASOUND FOR ASCITES TECHNIQUE: Limited  ultrasound survey for ascites was performed in all four abdominal quadrants. COMPARISON:  None Available. FINDINGS: Scanning was performed in all 4 quadrants and demonstrates moderate to severe ascites throughout the abdomen. IMPRESSION: Moderate to severe abdominal ascites. Electronically Signed   By: Inez Catalina M.D.   On: 01/09/2022 22:30   DG Chest Portable 1 View  Result Date: 01/09/2022 CLINICAL DATA:  Shortness of breath EXAM: PORTABLE CHEST 1 VIEW COMPARISON:  12/21/2021, 08/07/2021 FINDINGS: Post sternotomy changes. Cardiomegaly with vascular congestion and mild pulmonary edema. Stent in the left upper extremity. No consolidation or pneumothorax. IMPRESSION: Cardiomegaly with vascular congestion and mild pulmonary edema. Electronically Signed   By: Donavan Foil M.D.   On: 01/09/2022 18:12            LOS: 0 days     Emeterio Reeve, DO [facility] 01/10/2022, 1:55 PM   Staff may message me via secure chat in [place]  but this may not receive immediate response,  please page for urgent matters!  If 7PM-7AM, please contact night-coverage [email or web address]  Dictation software was used to generate the above note. Typos may occur and escape review, as with typed/written notes. Please contact Dr Sheppard Coil directly for clarity if needed.

## 2022-01-10 NOTE — Progress Notes (Signed)
Pre HD RN assessment 

## 2022-01-10 NOTE — ED Notes (Addendum)
Pt noted to be sleeping soundly and had to be woken up for medication administration.  BP is reading low, but Pt is lying on her side for comfort.   Hospitalist made aware. Will continue to monitor.

## 2022-01-10 NOTE — ED Notes (Signed)
Pt is now resting in bed on phone. Appears more comfortable at this time. Will continue to monitor

## 2022-01-10 NOTE — ED Notes (Signed)
Pt having episode of air hunger. Fan given. Assisted with repositioning and instructed that percocet will also help her with feeling air hunger. Pt requested graham crackers.

## 2022-01-10 NOTE — Progress Notes (Signed)
HD end 

## 2022-01-10 NOTE — Progress Notes (Signed)
Post HD vitals

## 2022-01-10 NOTE — ED Notes (Signed)
Lab notified of add-on phosphorus.

## 2022-01-10 NOTE — ED Notes (Signed)
Hospitalist at bedside 

## 2022-01-10 NOTE — Progress Notes (Signed)
HD start 

## 2022-01-10 NOTE — ED Notes (Signed)
Pt provided additional condiments for breakfast and a warm blanket.  NAD noted.

## 2022-01-10 NOTE — Assessment & Plan Note (Signed)
>>  ASSESSMENT AND PLAN FOR ACUTE ON CHRONIC COMBINED SYSTOLIC AND DIASTOLIC CONGESTIVE HEART FAILURE (HCC) WRITTEN ON 01/10/2022  1:43 PM BY ALEXANDER, NATALIE, DO  Exacerbation now is likely due to unable to pull fluid at recent dialysis d/t hypotension Dialysis - nephrology consulted, pt does not make urine  Close monitor BP - chronic/recurrent hypotension Had echo recently - will not repeat  Monitor BMP, Mag

## 2022-01-10 NOTE — Assessment & Plan Note (Signed)
-   Continue home statin °

## 2022-01-10 NOTE — ED Notes (Signed)
Pt reports hemodialysis will happen after US Paracentesis.

## 2022-01-10 NOTE — Assessment & Plan Note (Addendum)
Exacerbation now is likely due to unable to pull fluid at recent dialysis d/t hypotension  Dialysis - nephrology consulted, pt does not make urine   Close monitor BP - chronic/recurrent hypotension  Had echo recently - will not repeat   Monitor BMP, Mag

## 2022-01-10 NOTE — Assessment & Plan Note (Addendum)
   Holding carvedilol d/t hypotension - monitor on tele for rebound tachycardia  Cont home amiodarone and eliquis

## 2022-01-10 NOTE — Progress Notes (Signed)
Pt refusing HD until Gebauers spray administered. Pyxis reads none available. Pharmacy notified.

## 2022-01-11 ENCOUNTER — Inpatient Hospital Stay: Payer: Medicare Other

## 2022-01-11 DIAGNOSIS — I5043 Acute on chronic combined systolic (congestive) and diastolic (congestive) heart failure: Secondary | ICD-10-CM | POA: Diagnosis not present

## 2022-01-11 LAB — BASIC METABOLIC PANEL
Anion gap: 9 (ref 5–15)
BUN: 31 mg/dL — ABNORMAL HIGH (ref 6–20)
CO2: 26 mmol/L (ref 22–32)
Calcium: 8.4 mg/dL — ABNORMAL LOW (ref 8.9–10.3)
Chloride: 100 mmol/L (ref 98–111)
Creatinine, Ser: 5.64 mg/dL — ABNORMAL HIGH (ref 0.44–1.00)
GFR, Estimated: 8 mL/min — ABNORMAL LOW (ref >60)
Glucose, Bld: 89 mg/dL (ref 70–99)
Potassium: 6.6 mmol/L (ref 3.5–5.1)
Sodium: 135 mmol/L (ref 135–145)

## 2022-01-11 LAB — POTASSIUM: Potassium: 5.5 mmol/L — ABNORMAL HIGH (ref 3.5–5.1)

## 2022-01-11 LAB — TROPONIN I (HIGH SENSITIVITY)
Troponin I (High Sensitivity): 34 ng/L — ABNORMAL HIGH (ref <18)
Troponin I (High Sensitivity): 32 ng/L — ABNORMAL HIGH

## 2022-01-11 LAB — MAGNESIUM: Magnesium: 2.2 mg/dL (ref 1.7–2.4)

## 2022-01-11 MED ORDER — HYDROXYZINE HCL 25 MG PO TABS
50.0000 mg | ORAL_TABLET | Freq: Three times a day (TID) | ORAL | Status: DC | PRN
  Administered 2022-01-11: 50 mg via ORAL
  Filled 2022-01-11: qty 2

## 2022-01-11 MED ORDER — NITROGLYCERIN 0.4 MG SL SUBL
0.4000 mg | SUBLINGUAL_TABLET | SUBLINGUAL | Status: DC | PRN
  Administered 2022-01-11: 0.4 mg via SUBLINGUAL
  Filled 2022-01-11: qty 1

## 2022-01-11 MED ORDER — SODIUM ZIRCONIUM CYCLOSILICATE 10 G PO PACK
10.0000 g | PACK | Freq: Once | ORAL | Status: AC
Start: 2022-01-11 — End: 2022-01-11
  Administered 2022-01-11: 10 g via ORAL
  Filled 2022-01-11: qty 1

## 2022-01-11 NOTE — Progress Notes (Signed)
Pt c/o chest pain; MD notified

## 2022-01-11 NOTE — TOC Initial Note (Signed)
Transition of Care [facility]) - Initial/Assessment Note    Patient Details  Name: [name] MRN: [id number] Date of Birth: 14-Nov-1960  Transition of Care [facility]) CM/SW Contact:    Kerin Salen, RN Phone Number: 01/11/2022, 3:58 PM  Clinical Narrative:  Spoke with patient, family at bedside. Patient voices living alone, with O2 2L as needed. Medication delivered by [employer]. [place] equipment, Oxygen, [employer], walker, cane and wheelchair. Great family support who assist with transportation to medical visits. PCP Dr. Humphrey Rolls from [place], seen last week, nurse to set up for Medical Alert device and [place]. TOCCM to track for discharge needs, PT/OT pending.                Expected Discharge Plan: [place] Barriers to Discharge: Continued Medical Work up   Patient Goals and CMS Choice Patient states their goals for this hospitalization and ongoing recovery are:: To return home   Choice offered to / list presented to : NA  Expected Discharge Plan and Services Expected Discharge Plan: [place] In-house Referral: Clinical Social Work     Living arrangements for the past 2 months: [place]                                      Prior Living Arrangements/Services Living arrangements for the past 2 months: Single Family Home Lives with:: Self Patient language and need for interpreter reviewed:: Yes Do you feel safe going back to the place where you live?: Yes      Need for Family Participation in Patient Care: No (Comment) Care giver support system in place?: Yes (comment) Current home services: DME (Oxygen-[employer]. Walker, wheelchair and cane.) Criminal Activity/Legal Involvement Pertinent to Current Situation/Hospitalization: No - Comment as needed  Activities of Daily Living Home Assistive Devices/Equipment: None ADL Screening (condition at time of admission) Patient's cognitive ability adequate to safely  complete daily activities?: Yes Is the patient deaf or have difficulty hearing?: No Does the patient have difficulty seeing, even when wearing glasses/contacts?: Yes Does the patient have difficulty concentrating, remembering, or making decisions?: No Patient able to express need for assistance with ADLs?: No Does the patient have difficulty dressing or bathing?: No Independently performs ADLs?: Yes (appropriate for developmental age) Does the patient have difficulty walking or climbing stairs?: Yes Weakness of Legs: Both Weakness of Arms/Hands: None  Permission Sought/Granted                  Emotional Assessment Appearance:: Appears stated age Attitude/Demeanor/Rapport: Engaged Affect (typically observed): Accepting Orientation: : Oriented to Self, Oriented to Place, Oriented to  Time, Oriented to Situation Alcohol / Substance Use: Not Applicable Psych Involvement: No (comment)  Admission diagnosis:  SOB (shortness of breath) [R06.02] Other ascites [R18.8] Acute on chronic combined systolic and diastolic congestive heart failure (HCC) [I50.43] Patient Active Problem List   Diagnosis Date Noted   Acute on chronic combined systolic and diastolic congestive heart failure ([place]) 01/09/2022   Chronic combined systolic and diastolic CHF (congestive heart failure) ([place]) 12/19/2021   Ascites 12/19/2021   Chronic respiratory failure with hypoxia ([place]) 12/19/2021   Hepatitis C antibody positive in blood 12/19/2021   Thrombocytopenia ([place]) 08/08/2021   Paroxysmal atrial fibrillation ([place])    Hypothyroidism    Syncope and collapse 07/25/2020   Unstable angina ([place]) 01/23/2019   Swelling of limb 09/22/2016  Kidney dialysis as the cause of abnormal reaction of the patient, or of later complication, without mention of misadventure at the time of the procedure (CODE) 03/24/2016   Hyperkalemia 11/13/2014   Generalized weakness 10/15/2014   ESRD on hemodialysis ([place]) 10/15/2014    Congestive heart failure (CHF) ([place]) 10/15/2014   Chronic hypotension 10/15/2014   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   Pericardial effusion 04/12/2014   History of delirium 04/12/2014   H/O atrial flutter 04/12/2014   Chest wall pain following surgery 04/12/2014   Lactic acidosis 03/26/2014   Arteriosclerotic dementia ([place]) 03/25/2014   Superficial incisional surgical site infection 03/14/2014   Postoperative anemia due to acute blood loss 02/27/2014   H/O: substance abuse ([place]) 02/27/2014   Leukocytosis 02/26/2014   Exhausted vascular access 02/24/2014   Anemia of chronic disease 02/24/2014   S/P CABG x 3 02/21/2014   Hyperlipidemia 02/13/2014   History of hepatitis C 02/13/2014   H/O stroke without residual deficits 02/13/2014   Retrosternal chest pain 05/30/2013   Gallstones 05/29/2013   PCP:  [name], MD Pharmacy:   [place], [place] [address] [place]  [address] Phone: [phone] Fax: [phone]     Social Determinants of Health (SDOH) Interventions    Readmission Risk Interventions    12/23/2021   11:41 AM  Readmission Risk Prevention Plan  Transportation Screening Complete  HRI or Home Care Consult Complete  Social Work Consult for [place] Planning/Counseling Complete  Palliative Care Screening Not Applicable  Medication Review [employer]) Complete

## 2022-01-11 NOTE — Progress Notes (Signed)
PROGRESS NOTE    [name]  WCB:[id number] DOB: 24-Jan-1961  DOA: 01/09/2022 Date of Service: 01/11/22 PCP: [name], MD     Brief Narrative / Hospital Course:  [name] is a 61 y.o. female with medical history significant of CHF, ESRD on HD (does not make urine), hypertension, hepatitis C status posttreatment, chronic respiratory failure with hypoxia, CAD status post CABG, atrial fibrillation on Eliquis, anemia, hypothyroidism, stroke, hyperlipidemia, pericardial effusion status post pericardiotomy presenting to ED 01/09/22 with shortness of breath x several days w/ cough and pleuritic CP, unable to pull fluid at HD on day of admission d/t SOB. Recent prior hospital admission for ascites w/ paracentesis 12/20/2021.  07/28: ED - BNP 2400, CXR cardiomegaly with vascular congestion and mild pulmonary edema. Admitted to hospitalist service for Acute on chronic combined systolic and diastolic congestive heart failure. Echo earlier this month with EF 25-30%, G2 DD, moderately reduced RV function. Hypotensive at baseline, on midodrine. Held home CAD Rx (BB, ARB). Abd [place] (+) mod/severe ascites.  07/29: nephrology following. Initial plan was paracentesis then HD but IR did not receive order so paracentesis was held up, ok for patient to receive HD in the meantime. HD removed 2L.  07/30: pending paracentesis, no IR over the weekend, waiting until tomorrow.  Progression goals: Nephrology recs - dialysis hopefully will improve CHF/symptoms  Paracentesis ordered - hopefully will improve CHF/symptoms     Consultants:  Nephrology Interventional radiology   Procedures: [Planned paracentesis and dialysis]    Subjective: Patient reports SOB and abdominal distension are bothersome but stable. No other complaints.      ASSESSMENT & PLAN:   Principal Problem:   Acute on chronic combined systolic and diastolic congestive heart failure (HCC) Active Problems:   Ascites    ESRD on hemodialysis (HCC)   Chronic combined systolic and diastolic CHF (congestive heart failure) (HCC)   Chronic respiratory failure with hypoxia (HCC)   Paroxysmal atrial fibrillation (HCC)   Chronic hypotension   Anemia of chronic disease   Hypothyroidism   S/P CABG x 3   Hyperlipidemia   H/O stroke without residual deficits   Hepatitis C antibody positive in blood   Acute on chronic combined systolic and diastolic congestive heart failure (HCC) Exacerbation now is likely due to unable to pull fluid at recent dialysis d/t hypotension Dialysis - nephrology consulted, pt does not make urine  Close monitor BP - chronic/recurrent hypotension Had echo recently - will not repeat  Monitor BMP, Mag  ESRD on hemodialysis [facility]) Nephrology following   Ascites Paracentesis ordered, should be able to be performed tomorrow (IR not here over the weekend)  S/P CABG x 3 Continue home Ranexa, rosuvastatin Holding home carvedilol and losartan in the setting of hypotension.  Paroxysmal atrial fibrillation (HCC) Holding carvedilol d/t hypotension - monitor on tele for rebound tachycardia Cont home amiodarone and eliquis   Hypothyroidism Cont home synthroid   Hyperlipidemia Continue home statin   H/O stroke without residual deficits Cont home statin and plavix   Chronic hypotension Cont midodrine    DVT prophylaxis: Eliquis  Code Status: FULL Family Communication: declined call to family at this time  Disposition Plan / TOC needs: inpatient  Barriers to discharge / significant pending items: paracentesis, dialysis pending              Objective: Vitals:   01/10/22 2314 01/11/22 0426 01/11/22 0742 01/11/22 1200  BP: (!) 90/57 93/71 (!) 81/57 (!) 100/57  Pulse: (!) 51 Marland Kitchen)  56 95 87  Resp: '17 19 18 18  '$ Temp: (!) 97.5 F (36.4 C) 98.1 F (36.7 C)  98 F (36.7 C)  TempSrc:  Oral  Oral  SpO2: 100% 98% 100% 99%  Weight:  74.1 kg    Height:        Intake/Output  Summary (Last 24 hours) at 01/11/2022 1431 Last data filed at 01/11/2022 0900 Gross per 24 hour  Intake 442.77 ml  Output 2000 ml  Net -1557.23 ml   Filed Weights   01/10/22 1538 01/10/22 1913 01/11/22 0426  Weight: 76.2 kg 74.6 kg 74.1 kg    Examination:  Constitutional:  VS as above General Appearance: alert, well-developed, well-nourished, NAD Eyes: Normal lids and conjunctive, non-icteric sclera Ears, Nose, Mouth, Throat: Normal appearance Neck: No masses, trachea midline Respiratory: Normal respiratory effort Breath sounds (+) diffuse rales Cardiovascular: S1/S2 normal (+)JVD No lower extremity edema Gastrointestinal: Nontender Distended  Musculoskeletal:  No clubbing/cyanosis of digits Neurological: No cranial nerve deficit on limited exam Psychiatric: Normal judgment/insight Normal mood and affect       Scheduled Medications:   amiodarone  200 mg Oral Daily   apixaban  5 mg Oral BID   Chlorhexidine Gluconate Cloth  6 each Topical Q0600   cinacalcet  90 mg Oral Q breakfast   levothyroxine  75 mcg Oral QAC breakfast   midodrine  10 mg Oral TID WC   pantoprazole  40 mg Oral Daily   ranolazine  1,000 mg Oral BID   rosuvastatin  20 mg Oral QHS   sevelamer carbonate  3,200 mg Oral TID WC   sodium chloride flush  3 mL Intravenous Q12H    Continuous Infusions:    PRN Medications:  acetaminophen **OR** acetaminophen, hydrOXYzine, lidocaine (PF), lidocaine-prilocaine, oxyCODONE-acetaminophen, pentafluoroprop-tetrafluoroeth, polyethylene glycol  Antimicrobials:  Anti-infectives (From admission, onward)    None       Data Reviewed: I have personally reviewed following labs and imaging studies  CBC: Recent Labs  Lab 01/09/22 1804 01/10/22 0634  WBC 3.8* 3.6*  NEUTROABS 2.2  --   HGB 9.4* 9.5*  HCT 30.9* 30.5*  MCV 102.3* 101.3*  PLT 114* 785*   Basic Metabolic Panel: Recent Labs  Lab 01/09/22 1804 01/10/22 0634 01/10/22 0834  01/11/22 0629 01/11/22 1230  NA 142 141  --  135  --   K 4.1 4.0  --  6.6* 5.5*  CL 103 105  --  100  --   CO2 29 27  --  26  --   GLUCOSE 88 87  --  89  --   BUN 17 20  --  31*  --   CREATININE 3.71* 4.69*  --  5.64*  --   CALCIUM 8.5* 8.6*  --  8.4*  --   MG 2.3  --   --  2.2  --   PHOS  --   --  2.7  --   --    GFR: Estimated Creatinine Clearance: 11.1 mL/min (A) (by C-G formula based on SCr of 5.64 mg/dL (H)). Liver Function Tests: Recent Labs  Lab 01/09/22 1804 01/10/22 0634  AST 23 19  ALT 16 14  ALKPHOS 88 84  BILITOT 1.0 1.0  PROT 7.1 6.9  ALBUMIN 3.6 3.4*   No results for input(s): "LIPASE", "AMYLASE" in the last 168 hours. No results for input(s): "AMMONIA" in the last 168 hours. Coagulation Profile: No results for input(s): "INR", "PROTIME" in the last 168 hours. Cardiac Enzymes: No results for  input(s): "CKTOTAL", "CKMB", "CKMBINDEX", "TROPONINI" in the last 168 hours. BNP (last 3 results) No results for input(s): "PROBNP" in the last 8760 hours. HbA1C: No results for input(s): "HGBA1C" in the last 72 hours. CBG: Recent Labs  Lab 01/10/22 2139  GLUCAP 95   Lipid Profile: No results for input(s): "CHOL", "HDL", "LDLCALC", "TRIG", "CHOLHDL", "LDLDIRECT" in the last 72 hours. Thyroid Function Tests: No results for input(s): "TSH", "T4TOTAL", "FREET4", "T3FREE", "THYROIDAB" in the last 72 hours. Anemia Panel: No results for input(s): "VITAMINB12", "FOLATE", "FERRITIN", "TIBC", "IRON", "RETICCTPCT" in the last 72 hours. Urine analysis:    Component Value Date/Time   COLORURINE Yellow 09/14/2012 0414   APPEARANCEUR Cloudy 09/14/2012 0414   LABSPEC 1.018 09/14/2012 0414   PHURINE 9.0 09/14/2012 0414   GLUCOSEU 50 mg/dL 09/14/2012 0414   HGBUR 1+ 09/14/2012 0414   BILIRUBINUR Negative 09/14/2012 0414   KETONESUR Negative 09/14/2012 0414   PROTEINUR >=500 09/14/2012 0414   NITRITE Negative 09/14/2012 0414   LEUKOCYTESUR 2+ 09/14/2012 0414   Sepsis  Labs: '@LABRCNTIP'$ (procalcitonin:4,lacticidven:4)  No results found for this or any previous visit (from the past 240 hour(s)).       Radiology Studies last 96 hours: [place] ASCITES (ABDOMEN LIMITED)  Result Date: 01/09/2022 CLINICAL DATA:  Abdominal distension EXAM: LIMITED ABDOMEN ULTRASOUND FOR ASCITES TECHNIQUE: Limited ultrasound survey for ascites was performed in all four abdominal quadrants. COMPARISON:  None Available. FINDINGS: Scanning was performed in all 4 quadrants and demonstrates moderate to severe ascites throughout the abdomen. IMPRESSION: Moderate to severe abdominal ascites. Electronically Signed   By: Inez Catalina M.D.   On: 01/09/2022 22:30   DG Chest Portable 1 View  Result Date: 01/09/2022 CLINICAL DATA:  Shortness of breath EXAM: PORTABLE CHEST 1 VIEW COMPARISON:  12/21/2021, 08/07/2021 FINDINGS: Post sternotomy changes. Cardiomegaly with vascular congestion and mild pulmonary edema. Stent in the left upper extremity. No consolidation or pneumothorax. IMPRESSION: Cardiomegaly with vascular congestion and mild pulmonary edema. Electronically Signed   By: Donavan Foil M.D.   On: 01/09/2022 18:12            LOS: 1 day     Emeterio Reeve, DO [facility] 01/11/2022, 2:31 PM   Staff may message me via secure chat in [place]  but this may not receive immediate response,  please page for urgent matters!  If 7PM-7AM, please contact night-coverage [email or web address]  Dictation software was used to generate the above note. Typos may occur and escape review, as with typed/written notes. Please contact Dr Sheppard Coil directly for clarity if needed.

## 2022-01-11 NOTE — Progress Notes (Signed)
PT Cancellation Note  Patient Details Name: [name] MRN: [id number] DOB: [birth year]   Cancelled Treatment:    Reason Eval/Treat Not Completed: Patient not medically ready.  PT consult received.  Chart reviewed.  Pt's potassium noted to be elevated to 6.6 this morning; per therapy guidelines for elevated potassium, exertional activity contraindicated.  Will hold PT at this time and re-attempt PT evaluation at a later date/time as medically appropriate.  Leitha Bleak, PT 01/11/22, 11:57 AM

## 2022-01-11 NOTE — Progress Notes (Addendum)
RN sent secure chat to report patient having chest pain, CP resolved initially with Percocet.  Patient describes as right-sided chest pain that radiates into her shoulder, she states it feels similar to previous heart attack.  It is improved now but not completely resolved.  EKG reviewed, no acute changes for ACS, no STEMI.  Ordering troponin now and will repeat EKG and troponin in another 6 hours.  Nitro ordered if patient has chest pain again.  Suspect may also be due to referred pain from significant ascites, no burning pain that would suspect for GERD.  Will follow troponin.  Patient has history of CAD and is here for treatment of CHF.  --> troponin flat compared w/ admission, above WNL d/t ESRD, will still repeat around 2230 to trend again

## 2022-01-11 NOTE — Progress Notes (Signed)
[facility], [place] 01/11/22  Subjective:   LOS: 1  Patient known to our practice from outpatient dialysis.  She currently dialyzes at [facility] dialysis on MWF first shift.  Last hemodialysis was July 28 which is yesterday.  Patient presents for several days of worsening shortness of breath.  She feels like ascites has worsened.  She has chronic ascites and periodically gets paracentesis.  She has developed some lower extremity edema also.  Hospital course-patient underwent ultrafiltration only treatment on Saturday.  2 L volume was removed.  Awaiting paracentesis.  States appetite is low.  Not as short of breath as yesterday, however reports rough night.  Currently on 3 L oxygen supplementation by nasal cannula.   Objective:  Vital signs in last 24 hours:  Temp:  [97.3 F (36.3 C)-98.2 F (36.8 C)] 98 F (36.7 C) (07/30 1200) Pulse Rate:  [51-95] 87 (07/30 1200) Resp:  [10-22] 18 (07/30 1200) BP: (81-108)/(55-80) 100/57 (07/30 1200) SpO2:  [94 %-100 %] 99 % (07/30 1200) Weight:  [74.1 kg-76.2 kg] 74.1 kg (07/30 0426)  Weight change: 3.625 kg Filed Weights   01/10/22 1538 01/10/22 1913 01/11/22 0426  Weight: 76.2 kg 74.6 kg 74.1 kg    Intake/Output:    Intake/Output Summary (Last 24 hours) at 01/11/2022 1218 Last data filed at 01/11/2022 0900 Gross per 24 hour  Intake 442.77 ml  Output 2000 ml  Net -1557.23 ml      Physical Exam: General: Laying in the bed, no acute distress  HEENT Anicteric, moist oral mucous membranes  Pulm/lungs Mild crackles at lung bases otherwise clear  CVS/Heart Regular  Abdomen:  Distended, ascites present  Extremities: 1+ pitting edema  Neurologic: Alert, oriented  Skin: Warm, dry  Access: Left upper extremity AV graft.       Basic Metabolic Panel:  Recent Labs  Lab 01/09/22 1804 01/10/22 0634 01/10/22 0834 01/11/22 0629  NA 142 141  --  135  K 4.1 4.0  --  6.6*  CL 103 105  --  100  CO2 29  27  --  26  GLUCOSE 88 87  --  89  BUN 17 20  --  31*  CREATININE 3.71* 4.69*  --  5.64*  CALCIUM 8.5* 8.6*  --  8.4*  MG 2.3  --   --  2.2  PHOS  --   --  2.7  --       CBC: Recent Labs  Lab 01/09/22 1804 01/10/22 0634  WBC 3.8* 3.6*  NEUTROABS 2.2  --   HGB 9.4* 9.5*  HCT 30.9* 30.5*  MCV 102.3* 101.3*  PLT 114* 106*       Lab Results  Component Value Date   HEPBSAG NON REACTIVE 12/19/2021   HEPBSAB NON REACTIVE 12/19/2021   HEPBIGM Negative 01/18/2019      Microbiology:  No results found for this or any previous visit (from the past 240 hour(s)).  Coagulation Studies: No results for input(s): "LABPROT", "INR" in the last 72 hours.  Urinalysis: No results for input(s): "COLORURINE", "LABSPEC", "PHURINE", "GLUCOSEU", "HGBUR", "BILIRUBINUR", "KETONESUR", "PROTEINUR", "UROBILINOGEN", "NITRITE", "LEUKOCYTESUR" in the last 72 hours.  Invalid input(s): "APPERANCEUR"    Imaging: [place] ASCITES (ABDOMEN LIMITED)  Result Date: 01/09/2022 CLINICAL DATA:  Abdominal distension EXAM: LIMITED ABDOMEN ULTRASOUND FOR ASCITES TECHNIQUE: Limited ultrasound survey for ascites was performed in all four abdominal quadrants. COMPARISON:  None Available. FINDINGS: Scanning was performed in all 4 quadrants and demonstrates moderate to severe ascites throughout  the abdomen. IMPRESSION: Moderate to severe abdominal ascites. Electronically Signed   By: Inez Catalina M.D.   On: 01/09/2022 22:30   DG Chest Portable 1 View  Result Date: 01/09/2022 CLINICAL DATA:  Shortness of breath EXAM: PORTABLE CHEST 1 VIEW COMPARISON:  12/21/2021, 08/07/2021 FINDINGS: Post sternotomy changes. Cardiomegaly with vascular congestion and mild pulmonary edema. Stent in the left upper extremity. No consolidation or pneumothorax. IMPRESSION: Cardiomegaly with vascular congestion and mild pulmonary edema. Electronically Signed   By: Donavan Foil M.D.   On: 01/09/2022 18:12     Medications:      amiodarone   200 mg Oral Daily   apixaban  5 mg Oral BID   Chlorhexidine Gluconate Cloth  6 each Topical Q0600   cinacalcet  90 mg Oral Q breakfast   levothyroxine  75 mcg Oral QAC breakfast   midodrine  10 mg Oral TID WC   pantoprazole  40 mg Oral Daily   ranolazine  1,000 mg Oral BID   rosuvastatin  20 mg Oral QHS   sevelamer carbonate  3,200 mg Oral TID WC   sodium chloride flush  3 mL Intravenous Q12H   acetaminophen **OR** acetaminophen, hydrOXYzine, lidocaine (PF), lidocaine-prilocaine, oxyCODONE-acetaminophen, pentafluoroprop-tetrafluoroeth, polyethylene glycol  Assessment/ Plan:  61 y.o. female with coronary artery disease, history of coronary artery disease status post CABG 2015, congestive heart failure, hypertension, stroke, end-stage renal disease, hepatitis C posttreatment, atrial fibrillation, anemia, hypothyroidism, history of pericardial effusion  was admitted on 01/09/2022 for  Principal Problem:   Acute on chronic combined systolic and diastolic congestive heart failure ([place]) Active Problems:   ESRD on hemodialysis (HCC)   Chronic hypotension   S/P CABG x 3   Hyperlipidemia   H/O stroke without residual deficits   Anemia of chronic disease   Paroxysmal atrial fibrillation (HCC)   Hypothyroidism   Chronic combined systolic and diastolic CHF (congestive heart failure) (HCC)   Ascites   Chronic respiratory failure with hypoxia (HCC)   Hepatitis C antibody positive in blood  SOB (shortness of breath) [R06.02] Other ascites [R18.8] Acute on chronic combined systolic and diastolic congestive heart failure [facility]) [I50.43]  Outpatient dialysis: [facility].  MWF 1.  Left arm AV graft.  Target weight 70.5 kg.  195 minutes.  #. ESRD Patient presents with significant volume overload.  2 L volume removed on Saturday with UF only treatment.  Patient had some clinical improvement in shortness of breath.  However, she is noted to have hyperkalemia this morning.  We will repeat  potassium stat.  If still elevated, may need urgent hemodialysis.  #. Anemia of CKD  Lab Results  Component Value Date   HGB 9.5 (L) 01/10/2022   Low dose EPO with HD  #. Secondary hyperparathyroidism of renal origin N 25.81      Component Value Date/Time   PTH 627 (H) 07/26/2020 0459   Lab Results  Component Value Date   PHOS 2.7 01/10/2022   Monitor calcium and phos level during this admission  # Chronic systolic + d NAT-5T echo from December 22, 2021-LVEF 25 to 30%, left ventricle has severely decreased function, global hypokinesis, mild LVH, grade 2 diastolic dysfunction, moderately reduced right ventricular systolic function, left atrium severely dilated, right atrium severely dilated, mild mitral valve regurgitation, moderate to severe tricuspid regurgitation  #Chronic ascites Likely combination of CHF with liver disease.  Patient has previous history of hepatitis C.  CT abdomen and pelvis without contrast from July 7 showed cardiomegaly, did  not show cirrhosis. Gets periodic ascites.   LOS: 1 Ettore Trebilcock 7/30/202312:18 PM  [facility] [place], [place]

## 2022-01-12 ENCOUNTER — Inpatient Hospital Stay: Payer: Medicare Other

## 2022-01-12 DIAGNOSIS — Z992 Dependence on renal dialysis: Secondary | ICD-10-CM | POA: Diagnosis not present

## 2022-01-12 DIAGNOSIS — N186 End stage renal disease: Secondary | ICD-10-CM | POA: Diagnosis not present

## 2022-01-12 DIAGNOSIS — I5043 Acute on chronic combined systolic (congestive) and diastolic (congestive) heart failure: Secondary | ICD-10-CM | POA: Diagnosis not present

## 2022-01-12 LAB — CBC
HCT: 36.2 % (ref 36.0–46.0)
Hemoglobin: 11.5 g/dL — ABNORMAL LOW (ref 12.0–15.0)
MCH: 31.4 pg (ref 26.0–34.0)
MCHC: 31.8 g/dL (ref 30.0–36.0)
MCV: 98.9 fL (ref 80.0–100.0)
Platelets: 154 10*3/uL (ref 150–400)
RBC: 3.66 MIL/uL — ABNORMAL LOW (ref 3.87–5.11)
RDW: 16.3 % — ABNORMAL HIGH (ref 11.5–15.5)
WBC: 6.2 10*3/uL (ref 4.0–10.5)
nRBC: 0 % (ref 0.0–0.2)

## 2022-01-12 LAB — BASIC METABOLIC PANEL
Anion gap: 13 (ref 5–15)
BUN: 44 mg/dL — ABNORMAL HIGH (ref 6–20)
CO2: 24 mmol/L (ref 22–32)
Calcium: 8.9 mg/dL (ref 8.9–10.3)
Chloride: 98 mmol/L (ref 98–111)
Creatinine, Ser: 6.68 mg/dL — ABNORMAL HIGH (ref 0.44–1.00)
GFR, Estimated: 7 mL/min — ABNORMAL LOW (ref >60)
Glucose, Bld: 81 mg/dL (ref 70–99)
Potassium: 6.2 mmol/L — ABNORMAL HIGH (ref 3.5–5.1)
Sodium: 135 mmol/L (ref 135–145)

## 2022-01-12 MED ORDER — PENTAFLUOROPROP-TETRAFLUOROETH EX AERO
INHALATION_SPRAY | CUTANEOUS | Status: AC
  Filled 2022-01-12: qty 30

## 2022-01-12 MED ORDER — CARVEDILOL 6.25 MG PO TABS: 3.1250 mg | ORAL_TABLET | Freq: Two times a day (BID) | ORAL | Status: DC

## 2022-01-12 MED ORDER — ONDANSETRON HCL 4 MG/2ML IJ SOLN: 4.0000 mg | Freq: Once | INTRAMUSCULAR | Status: DC

## 2022-01-12 MED ORDER — NITROGLYCERIN 0.4 MG SL SUBL: 0.4000 mg | SUBLINGUAL_TABLET | SUBLINGUAL | 0 refills | Status: DC | PRN

## 2022-01-12 MED ORDER — ALBUMIN HUMAN 25 % IV SOLN
INTRAVENOUS | Status: AC
  Administered 2022-01-12: 25 g
  Filled 2022-01-12: qty 100

## 2022-01-12 NOTE — Discharge Summary (Signed)
Physician Discharge Summary   Patient: [name] MRN: [id number]  DOB: 06/22/1960   Admit:     Date of Admission: 01/09/2022 Admitted from: home   Discharge: Date of discharge: 01/12/22 Disposition: Home Condition at discharge: good  CODE STATUS: FULL   Diet recommendation: Renal diet   Discharge Physician: Emeterio Reeve, DO [facility]     PCP: [name], MD  Recommendations for Outpatient Follow-up:  Follow up with PCP [name], MD in 2-4 Please monitor BP at dialysis, carvedilol was reduced  Please follow up on the following pending results: none    Discharge Instructions     Increase activity slowly   Complete by: As directed           Brief/Interim Summary: [name] is a 61 y.o. female with medical history significant of CHF, ESRD on HD (does not make urine), hypertension, hepatitis C status posttreatment, chronic respiratory failure with hypoxia, CAD status post CABG, atrial fibrillation on Eliquis, anemia, hypothyroidism, stroke, hyperlipidemia, pericardial effusion status post pericardiotomy presenting to ED 01/09/22 with shortness of breath x several days w/ cough and pleuritic CP, unable to pull fluid at HD on day of admission d/t SOB. Recent prior hospital admission for ascites w/ paracentesis 12/20/2021.  07/28: ED - BNP 2400, CXR cardiomegaly with vascular congestion and mild pulmonary edema. Admitted to hospitalist service for Acute on chronic combined systolic and diastolic congestive heart failure. Echo earlier this month with EF 25-30%, G2 DD, moderately reduced RV function. Hypotensive at baseline, on midodrine. Held home CAD Rx (BB, ARB). Abd [place] (+) mod/severe ascites.  07/29: nephrology following. Initial plan was paracentesis then HD but IR did not receive order so paracentesis was held up, ok for patient to receive HD in the meantime. HD removed 2L.  07/30: pending paracentesis, no IR over the weekend,  waiting until tomorrow.  07/31: Paracentesis removed 3.7L, HD removed 0.5 L., Pt feeling great, BP better, ok for discharge home.      Consultants:  Nephrology IR  Procedures:  Dialysis Paracentesis       Discharge Diagnoses: Principal Problem:   Acute on chronic combined systolic and diastolic congestive heart failure (HCC) Active Problems:   Ascites   ESRD on hemodialysis (HCC)   Chronic combined systolic and diastolic CHF (congestive heart failure) (HCC)   Chronic respiratory failure with hypoxia (HCC)   Paroxysmal atrial fibrillation (HCC)   Chronic hypotension   Anemia of chronic disease   Hypothyroidism   S/P CABG x 3   Hyperlipidemia   H/O stroke without residual deficits   Hepatitis C antibody positive in blood    Assessment & Plan:  Acute on chronic combined systolic and diastolic congestive heart failure (HCC) - EXACERBATION RESOLVED  Exacerbation now is likely due to unable to pull fluid at recent dialysis d/t hypotension Dialysis - nephrology consulted Close monitor BP - chronic/recurrent hypotension - improved Had echo recently - will not repeat  Monitor BMP, Mag  ESRD on hemodialysis [facility]) Nephrology following - continue outpatient   Ascites Paracentesis removed 3.7 L to great relief   S/P CABG x 3 Continue home Ranexa, rosuvastatin Holding home carvedilol and losartan in the setting of hypotension BP better - reduced home carvedilol.  Paroxysmal atrial fibrillation (HCC) Reduced carvedilol given hypotension but BP has improved  Cont home amiodarone and eliquis   Hypothyroidism Cont home synthroid   Hyperlipidemia Continue home statin   H/O stroke without residual deficits Cont  home statin and plavix   Chronic hypotension Cont midodrine      Discharge Instructions  Allergies as of 01/12/2022       Reactions   Morphine And Related Hives   Levaquin [levofloxacin] Itching   Severe itching; prickly sensation   Other     Latex Rash   Tape Rash        Medication List     TAKE these medications    amiodarone 200 MG tablet Commonly known as: PACERONE Take 200 mg by mouth daily.   carvedilol 6.25 MG tablet Commonly known as: COREG Take 0.5 tablets (3.125 mg total) by mouth 2 (two) times daily. What changed: how much to take   cinacalcet 90 MG tablet Commonly known as: SENSIPAR Take 90 mg by mouth daily.   dapagliflozin propanediol 10 MG Tabs tablet Commonly known as: FARXIGA Take 10 mg by mouth daily.   Eliquis 5 MG Tabs tablet Generic drug: apixaban Take 5 mg by mouth 2 (two) times daily.   hydrOXYzine 50 MG tablet Commonly known as: ATARAX Take 50 mg by mouth at bedtime.   levothyroxine 75 MCG tablet Commonly known as: SYNTHROID Take 75 mcg by mouth daily before breakfast.   lidocaine-prilocaine cream Commonly known as: EMLA Apply 1 application topically daily as needed (port access).   losartan 25 MG tablet Commonly known as: COZAAR Take 12.5 mg by mouth daily.   midodrine 10 MG tablet Commonly known as: PROAMATINE Take 1 tablet (10 mg total) by mouth 3 (three) times a week. Monday, Wednesday, Friday with hemodialysis   nitroGLYCERIN 0.4 MG SL tablet Commonly known as: NITROSTAT Place 1 tablet (0.4 mg total) under the tongue every 5 (five) minutes as needed for chest pain.   oxyCODONE-acetaminophen 5-325 MG tablet Commonly known as: PERCOCET/ROXICET Take 1 tablet by mouth 2 (two) times daily as needed for moderate pain.   pantoprazole 40 MG tablet Commonly known as: PROTONIX Take 40 mg by mouth daily.   ranolazine 1000 MG SR tablet Commonly known as: RANEXA Take 1 tablet (1,000 mg total) by mouth 2 (two) times daily.   rosuvastatin 20 MG tablet Commonly known as: CRESTOR Take 20 mg by mouth at bedtime.   sevelamer carbonate 800 MG tablet Commonly known as: RENVELA Take 3,200 mg by mouth 3 (three) times daily with meals.          Allergies  Allergen  Reactions   Morphine And Related Hives   Levaquin [Levofloxacin] Itching    Severe itching; prickly sensation   Other    Latex Rash   Tape Rash     Subjective: Feeling great today after fluid removal, would like to go home, no CP/SOB.    Discharge Exam: Vitals:   01/12/22 1330 01/12/22 1337  BP: 134/64 (!) 141/84  Pulse:  79  Resp:  20  Temp:    SpO2:  99%  General: Pt is alert, awake, not in acute distress Cardiovascular: RRR, S1/S2 +, no rubs, no gallops Respiratory: CTA bilaterally, no wheezing, no rhonchi Abdominal: Soft, NT, ND, bowel sounds + Extremities: no edema, no cyanosis     The results of significant diagnostics from this hospitalization (including imaging, microbiology, ancillary and laboratory) are listed below for reference.     Microbiology: No results found for this or any previous visit (from the past 240 hour(s)).   Labs: BNP (last 3 results) Recent Labs    08/07/21 1431 12/19/21 0630 01/09/22 1804  BNP 2,696.8* 2,326.7* 2,400.6*   Basic  Metabolic Panel: Recent Labs  Lab 01/09/22 1804 01/10/22 0634 01/10/22 0834 01/11/22 0629 01/11/22 1230 01/12/22 0710  NA 142 141  --  135  --  135  K 4.1 4.0  --  6.6* 5.5* 6.2*  CL 103 105  --  100  --  98  CO2 29 27  --  26  --  24  GLUCOSE 88 87  --  89  --  81  BUN 17 20  --  31*  --  44*  CREATININE 3.71* 4.69*  --  5.64*  --  6.68*  CALCIUM 8.5* 8.6*  --  8.4*  --  8.9  MG 2.3  --   --  2.2  --   --   PHOS  --   --  2.7  --   --   --    Liver Function Tests: Recent Labs  Lab 01/09/22 1804 01/10/22 0634  AST 23 19  ALT 16 14  ALKPHOS 88 84  BILITOT 1.0 1.0  PROT 7.1 6.9  ALBUMIN 3.6 3.4*   No results for input(s): "LIPASE", "AMYLASE" in the last 168 hours. No results for input(s): "AMMONIA" in the last 168 hours. CBC: Recent Labs  Lab 01/09/22 1804 01/10/22 0634 01/12/22 0710  WBC 3.8* 3.6* 6.2  NEUTROABS 2.2  --   --   HGB 9.4* 9.5* 11.5*  HCT 30.9* 30.5* 36.2  MCV  102.3* 101.3* 98.9  PLT 114* 106* 154   Cardiac Enzymes: No results for input(s): "CKTOTAL", "CKMB", "CKMBINDEX", "TROPONINI" in the last 168 hours. BNP: Invalid input(s): "POCBNP" CBG: Recent Labs  Lab 01/10/22 2139  GLUCAP 95   D-Dimer No results for input(s): "DDIMER" in the last 72 hours. Hgb A1c No results for input(s): "HGBA1C" in the last 72 hours. Lipid Profile No results for input(s): "CHOL", "HDL", "LDLCALC", "TRIG", "CHOLHDL", "LDLDIRECT" in the last 72 hours. Thyroid function studies No results for input(s): "TSH", "T4TOTAL", "T3FREE", "THYROIDAB" in the last 72 hours.  Invalid input(s): "FREET3" Anemia work up No results for input(s): "VITAMINB12", "FOLATE", "FERRITIN", "TIBC", "IRON", "RETICCTPCT" in the last 72 hours. Urinalysis    Component Value Date/Time   COLORURINE Yellow 09/14/2012 0414   APPEARANCEUR Cloudy 09/14/2012 0414   LABSPEC 1.018 09/14/2012 0414   PHURINE 9.0 09/14/2012 0414   GLUCOSEU 50 mg/dL 09/14/2012 0414   HGBUR 1+ 09/14/2012 0414   BILIRUBINUR Negative 09/14/2012 0414   KETONESUR Negative 09/14/2012 0414   PROTEINUR >=500 09/14/2012 0414   NITRITE Negative 09/14/2012 0414   LEUKOCYTESUR 2+ 09/14/2012 0414   Sepsis Labs Recent Labs  Lab 01/09/22 1804 01/10/22 0634 01/12/22 0710  WBC 3.8* 3.6* 6.2   Microbiology No results found for this or any previous visit (from the past 240 hour(s)). Imaging [place] ASCITES (ABDOMEN LIMITED)  Result Date: 01/09/2022 CLINICAL DATA:  Abdominal distension EXAM: LIMITED ABDOMEN ULTRASOUND FOR ASCITES TECHNIQUE: Limited ultrasound survey for ascites was performed in all four abdominal quadrants. COMPARISON:  None Available. FINDINGS: Scanning was performed in all 4 quadrants and demonstrates moderate to severe ascites throughout the abdomen. IMPRESSION: Moderate to severe abdominal ascites. Electronically Signed   By: Inez Catalina M.D.   On: 01/09/2022 22:30   DG Chest Portable 1 View  Result  Date: 01/09/2022 CLINICAL DATA:  Shortness of breath EXAM: PORTABLE CHEST 1 VIEW COMPARISON:  12/21/2021, 08/07/2021 FINDINGS: Post sternotomy changes. Cardiomegaly with vascular congestion and mild pulmonary edema. Stent in the left upper extremity. No consolidation or pneumothorax. IMPRESSION: Cardiomegaly with vascular  congestion and mild pulmonary edema. Electronically Signed   By: Donavan Foil M.D.   On: 01/09/2022 18:12      Time coordinating discharge: Over 30 minutes  SIGNED:  Emeterio Reeve DO [facility]

## 2022-01-12 NOTE — Progress Notes (Signed)
Per Gwyneth Revels, NP goal lowered to 1L and tx time 2.5 hr. Based on pt request for tx time and sbp runs low.

## 2022-01-12 NOTE — Progress Notes (Signed)
       CROSS COVER NOTE  NAME: [name] MRN: [id number] DOB : 19-May-1961    Date of Service   01/12/22  HPI/Events of Note   Secure chat received from nursing reporting emesis. Will recheck EKG for both QTc and ischemic changes. At this time [name] denies chest pain, dyspnea, palpitation, abdominal pain, or diaphoresis.    Interventions   Plan: Nausea self resolved without medication. Consider non-qT prolonging antiemetic for future reports of nausea.  EKG (see media tab or paper chart)- NSR with 1st Degree AVB; prolonged QTc       This document was prepared using Dragon voice recognition software and may include unintentional dictation errors.  Neomia Glass DNP, MHA, FNP-BC Nurse Practitioner [facility] Pager [phone]

## 2022-01-12 NOTE — Procedures (Signed)
PROCEDURE SUMMARY:  Successful US guided paracentesis from RLQ. Yielded 3.7 L of clear yellow fluid.  No immediate complications.  Pt tolerated well.   Specimen was not sent for labs.  EBL < 64m  MTsosie BillingD PA-C 01/12/2022 10:06 AM

## 2022-01-12 NOTE — Progress Notes (Signed)
Pt completed 2.5 hr HD tx, met 0.5L fluid removal goal and 45L BVP. Pt no c/o, report to primary RN.

## 2022-01-12 NOTE — Progress Notes (Signed)
PT Cancellation Note  Patient Details Name: [name] MRN: [id number] DOB: 1961/03/26   Cancelled Treatment:    Reason Eval/Treat Not Completed: Patient not medically ready.  Chart reviewed. Per secure chat with MD, holding therapy at this time (K+ 6.2 and pending paracentesis and dialysis). Will re-attempt therapy evaluation at a later date/time as medically appropriate.   Leitha Bleak, PT 01/12/22, 9:27 AM

## 2022-01-12 NOTE — Progress Notes (Signed)
Post HD RN assessment 

## 2022-01-12 NOTE — Progress Notes (Signed)
[facility], [place] 01/12/22  Subjective:   LOS: 2  Patient known to our practice from outpatient dialysis.  She currently dialyzes at [facility] dialysis on MWF first shift.    Patient seen and evaluated during dialysis   HEMODIALYSIS FLOWSHEET:  Blood Flow Rate (mL/min): 300 mL/min Arterial Pressure (mmHg): -90 mmHg Venous Pressure (mmHg): 180 mmHg TMP (mmHg): 3 mmHg Ultrafiltration Rate (mL/min): 680 mL/min Dialysate Flow Rate (mL/min): 300 ml/min Dialysis Fluid Bolus: Normal Saline Bolus Amount (mL): 300 mL  No complaints at this time Completed paracentesis this morning, 3.7 L removed. Requesting shortened dialysis treatment with limited fluid removal.   Objective:  Vital signs in last 24 hours:  Temp:  [97.6 F (36.4 C)-98.5 F (36.9 C)] 98.1 F (36.7 C) (07/31 1031) Pulse Rate:  [52-94] 59 (07/31 1100) Resp:  [11-20] 12 (07/31 1100) BP: (89-118)/(52-87) 118/70 (07/31 1100) SpO2:  [94 %-100 %] 100 % (07/31 1100)  Weight change:  Filed Weights   01/10/22 1538 01/10/22 1913 01/11/22 0426  Weight: 76.2 kg 74.6 kg 74.1 kg    Intake/Output:    Intake/Output Summary (Last 24 hours) at 01/12/2022 1117 Last data filed at 01/11/2022 1300 Gross per 24 hour  Intake 120 ml  Output --  Net 120 ml      Physical Exam: General: Laying in the bed, no acute distress  HEENT Anicteric, moist oral mucous membranes  Pulm/lungs Diminished in bases, normal effort,  CVS/Heart Regular  Abdomen:  Distended, ascites present  Extremities: 1+ pitting edema  Neurologic: Alert, oriented  Skin: Warm, dry  Access: Left upper extremity AV graft.       Basic Metabolic Panel:  Recent Labs  Lab 01/09/22 1804 01/10/22 0634 01/10/22 0834 01/11/22 0629 01/11/22 1230 01/12/22 0710  NA 142 141  --  135  --  135  K 4.1 4.0  --  6.6* 5.5* 6.2*  CL 103 105  --  100  --  98  CO2 29 27  --  26  --  24  GLUCOSE 88 87  --  89  --  81  BUN 17 20   --  31*  --  44*  CREATININE 3.71* 4.69*  --  5.64*  --  6.68*  CALCIUM 8.5* 8.6*  --  8.4*  --  8.9  MG 2.3  --   --  2.2  --   --   PHOS  --   --  2.7  --   --   --       CBC: Recent Labs  Lab 01/09/22 1804 01/10/22 0634 01/12/22 0710  WBC 3.8* 3.6* 6.2  NEUTROABS 2.2  --   --   HGB 9.4* 9.5* 11.5*  HCT 30.9* 30.5* 36.2  MCV 102.3* 101.3* 98.9  PLT 114* 106* 154       Lab Results  Component Value Date   HEPBSAG NON REACTIVE 12/19/2021   HEPBSAB NON REACTIVE 12/19/2021   HEPBIGM Negative 01/18/2019      Microbiology:  No results found for this or any previous visit (from the past 240 hour(s)).  Coagulation Studies: No results for input(s): "LABPROT", "INR" in the last 72 hours.  Urinalysis: No results for input(s): "COLORURINE", "LABSPEC", "PHURINE", "GLUCOSEU", "HGBUR", "BILIRUBINUR", "KETONESUR", "PROTEINUR", "UROBILINOGEN", "NITRITE", "LEUKOCYTESUR" in the last 72 hours.  Invalid input(s): "APPERANCEUR"    Imaging: No results found.   Medications:      amiodarone  200 mg Oral Daily   apixaban  5  mg Oral BID   Chlorhexidine Gluconate Cloth  6 each Topical Q0600   cinacalcet  90 mg Oral Q breakfast   levothyroxine  75 mcg Oral QAC breakfast   midodrine  10 mg Oral TID WC   pantoprazole  40 mg Oral Daily   ranolazine  1,000 mg Oral BID   rosuvastatin  20 mg Oral QHS   sevelamer carbonate  3,200 mg Oral TID WC   sodium chloride flush  3 mL Intravenous Q12H   acetaminophen **OR** acetaminophen, hydrOXYzine, lidocaine (PF), lidocaine-prilocaine, nitroGLYCERIN, oxyCODONE-acetaminophen, pentafluoroprop-tetrafluoroeth, polyethylene glycol  Assessment/ Plan:  61 y.o. female with coronary artery disease, history of coronary artery disease status post CABG 2015, congestive heart failure, hypertension, stroke, end-stage renal disease, hepatitis C posttreatment, atrial fibrillation, anemia, hypothyroidism, history of pericardial effusion  was admitted on  01/09/2022 for  Principal Problem:   Acute on chronic combined systolic and diastolic congestive heart failure ([place]) Active Problems:   ESRD on hemodialysis (HCC)   Chronic hypotension   S/P CABG x 3   Hyperlipidemia   H/O stroke without residual deficits   Anemia of chronic disease   Paroxysmal atrial fibrillation (HCC)   Hypothyroidism   Chronic combined systolic and diastolic CHF (congestive heart failure) (HCC)   Ascites   Chronic respiratory failure with hypoxia (HCC)   Hepatitis C antibody positive in blood  SOB (shortness of breath) [R06.02] Other ascites [R18.8] Acute on chronic combined systolic and diastolic congestive heart failure [facility]) [I50.43]  Outpatient dialysis: [facility].  MWF 1.  Left arm AV graft.  Target weight 70.5 kg.  195 minutes.  #. ESRD with hyperkalemia Patient received scheduled dialysis treatment, agreeable to treatment length 2.5 to 3 hours.  We will dialyze on 1K bath to correct potassium.  Next treatment scheduled for Wednesday.  #. Anemia of CKD  Lab Results  Component Value Date   HGB 11.5 (L) 01/12/2022  Hemoglobin within acceptable range. Continue low dose EPO with HD  #. Secondary hyperparathyroidism of renal origin N 25.81      Component Value Date/Time   PTH 627 (H) 07/26/2020 0459   Lab Results  Component Value Date   PHOS 2.7 01/10/2022   Calcium and phosphorus within acceptable range.  We will continue to monitor.  Continue sevelamer with meals.  # Chronic systolic + d GNF-6O echo from December 22, 2021-LVEF 25 to 30%, left ventricle has severely decreased function, global hypokinesis, mild LVH, grade 2 diastolic dysfunction, moderately reduced right ventricular systolic function, left atrium severely dilated, right atrium severely dilated, mild mitral valve regurgitation, moderate to severe tricuspid regurgitation  #Chronic ascites Likely combination of CHF with liver disease.  Patient has previous history of  hepatitis C.  CT abdomen and pelvis without contrast from July 7 showed cardiomegaly, did not show cirrhosis. Paracentesis completed by radiology, 3.7 L of clear fluid removed.   LOS: [place] 7/31/202311:17 Russell [place], [place]

## 2022-01-12 NOTE — Progress Notes (Signed)
OT Cancellation Note  Patient Details Name: [name] MRN: [id number] DOB: 30-May-1961   Cancelled Treatment:    Reason Eval/Treat Not Completed: Medical issues which prohibited therapy. Consult received, chart reviewed. Per secure chat with MD, holding therapy at this time. K+ 6.2 and pending paracentesis and dialysis. Will re-attempt at later date/time as medically appropriate.   Ardeth Perfect., MPH, MS, OTR/L ascom [phone] 01/12/22, 8:39 AM

## 2022-01-12 NOTE — Progress Notes (Signed)
Pt switched to 1K per Candiss Norse MD

## 2022-01-13 DIAGNOSIS — N186 End stage renal disease: Secondary | ICD-10-CM | POA: Diagnosis not present

## 2022-01-13 DIAGNOSIS — Z992 Dependence on renal dialysis: Secondary | ICD-10-CM | POA: Diagnosis not present

## 2022-01-14 DIAGNOSIS — I70212 Atherosclerosis of native arteries of extremities with intermittent claudication, left leg: Secondary | ICD-10-CM | POA: Diagnosis not present

## 2022-01-15 ENCOUNTER — Telehealth (INDEPENDENT_AMBULATORY_CARE_PROVIDER_SITE_OTHER): Payer: Self-pay

## 2022-01-15 DIAGNOSIS — Z992 Dependence on renal dialysis: Secondary | ICD-10-CM | POA: Diagnosis not present

## 2022-01-15 DIAGNOSIS — N186 End stage renal disease: Secondary | ICD-10-CM | POA: Diagnosis not present

## 2022-01-15 NOTE — Telephone Encounter (Signed)
Spoke with the patient and she is scheduled with Dr. Lucky Cowboy for a left arm fistulagram on 01/29/22 with a 11:30 am arrival time to the [facility]. Pre-procedure instructions were discussed  and will be mailed. Anesthesia was scheduled with Colletta [place].

## 2022-01-16 DIAGNOSIS — N186 End stage renal disease: Secondary | ICD-10-CM | POA: Diagnosis not present

## 2022-01-16 DIAGNOSIS — Z992 Dependence on renal dialysis: Secondary | ICD-10-CM | POA: Diagnosis not present

## 2022-01-19 DIAGNOSIS — Z992 Dependence on renal dialysis: Secondary | ICD-10-CM | POA: Diagnosis not present

## 2022-01-19 DIAGNOSIS — N186 End stage renal disease: Secondary | ICD-10-CM | POA: Diagnosis not present

## 2022-01-20 IMAGING — CT CT HEAD W/O CM
4 series · 16 of 47 positions shown, 18 images · non-contrast
Comparison: 08/05/2018

CLINICAL DATA: Generalized weakness since dialysis yesterday

EXAM:
CT HEAD WITHOUT CONTRAST
TECHNIQUE: Contiguous axial images were obtained from the base of the skull
through the vertex without intravenous contrast.

[Series 2: head bone · axial · 0.40mm/px · z∈[-128,-100]mm · 3 of 69 slices shown]
[im 7/69  bone]
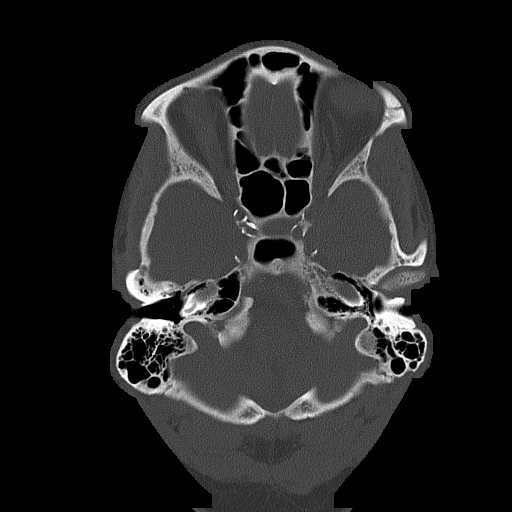
[im 14/69  bone]
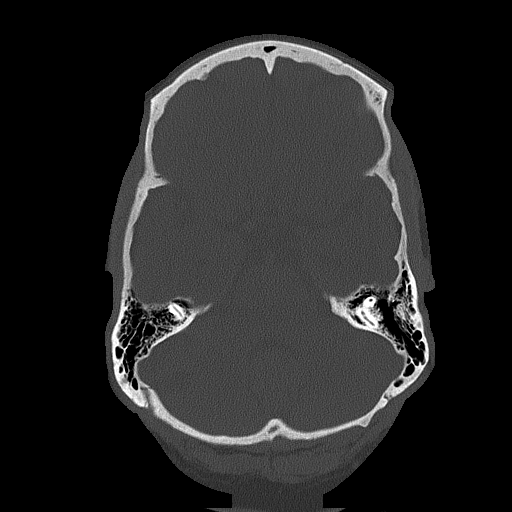
[im 21/69  bone]
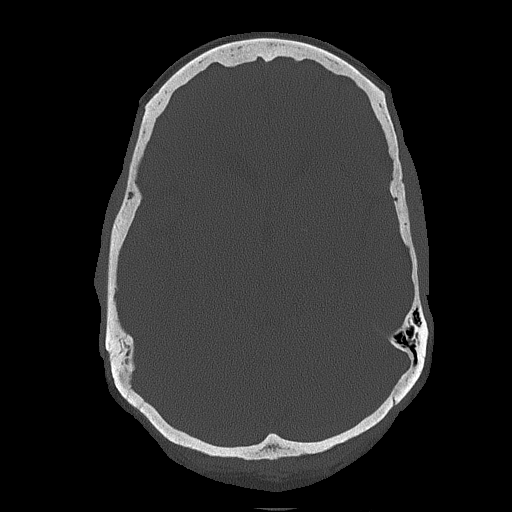

[Series 3: head wo · axial · 0.40mm/px · z∈[-125,-25]mm · 7 of 28 slices shown, 9 images]
[im 4/28  brain]
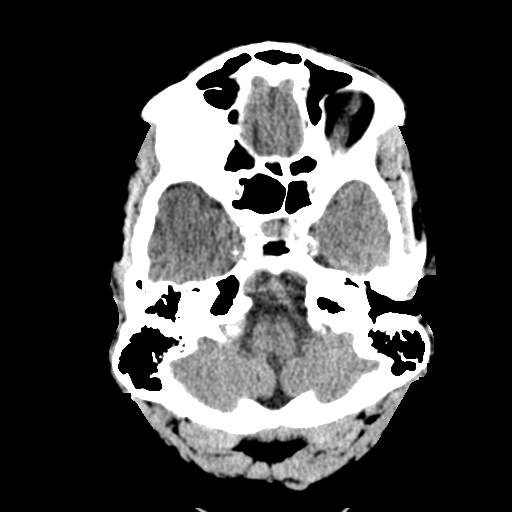
[im 4/28  bone]
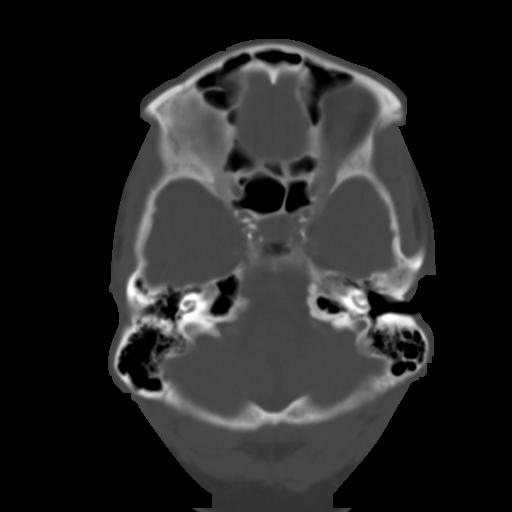
[im 7/28  brain]
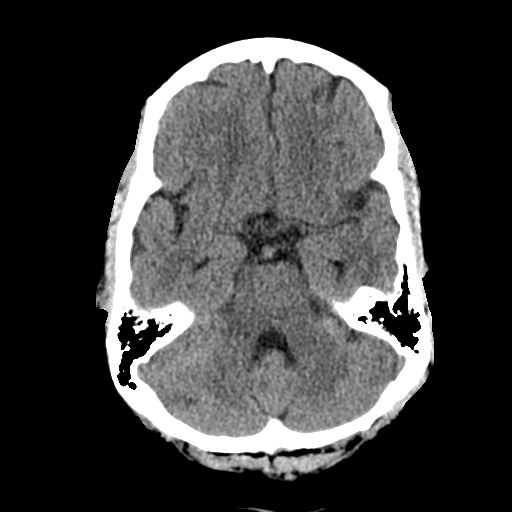
[im 11/28  brain]
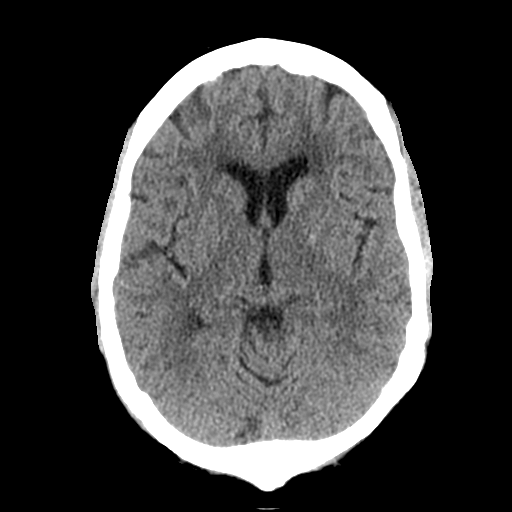
[im 14/28  brain]
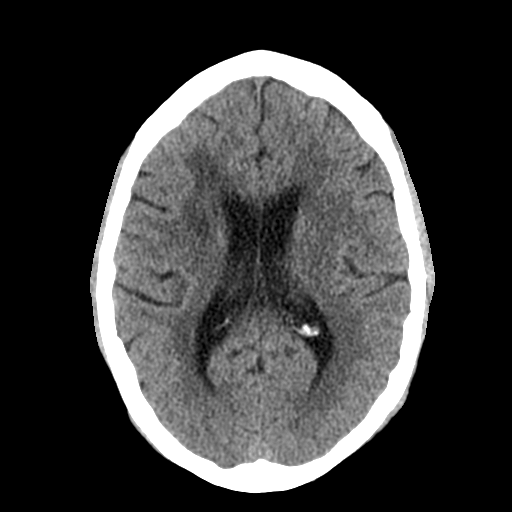
[im 17/28  brain]
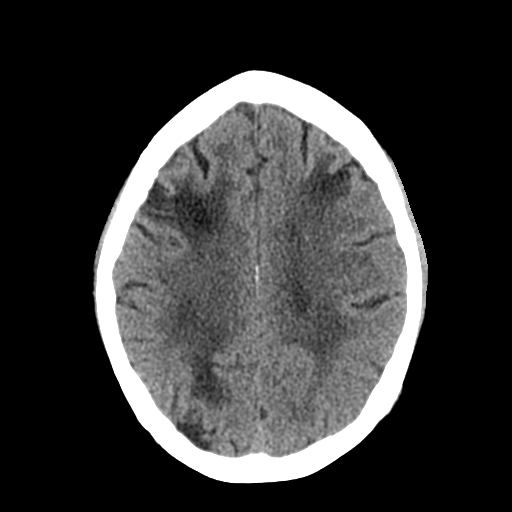
[im 17/28  bone]
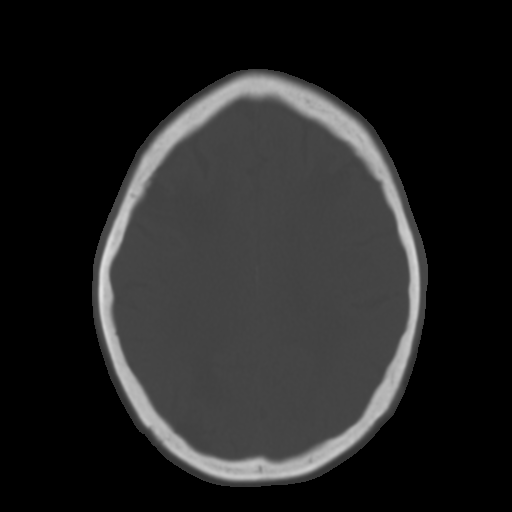
[im 21/28  brain]
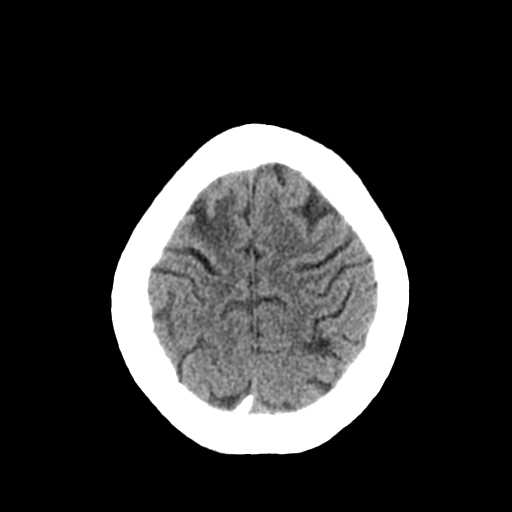
[im 24/28  brain]
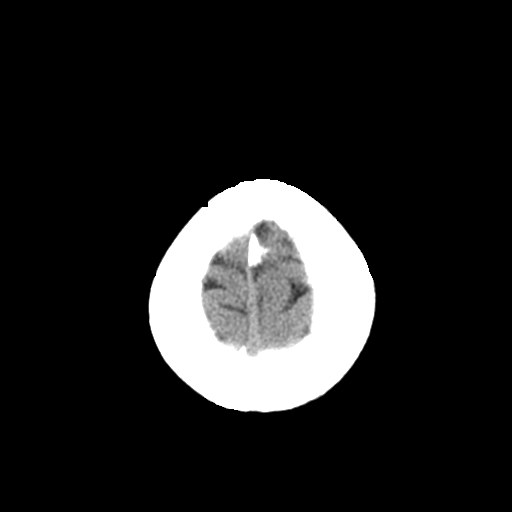

[Series 4: coronal soft tissue · coronal · 0.28mm/px · 3 of 66 slices shown]
[im 22/66  brain]
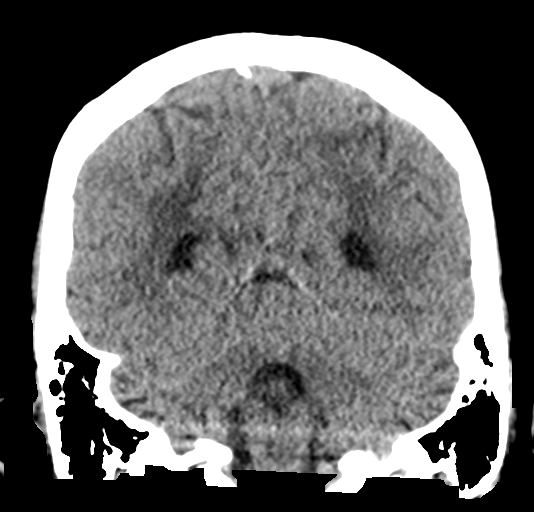
[im 29/66  brain]
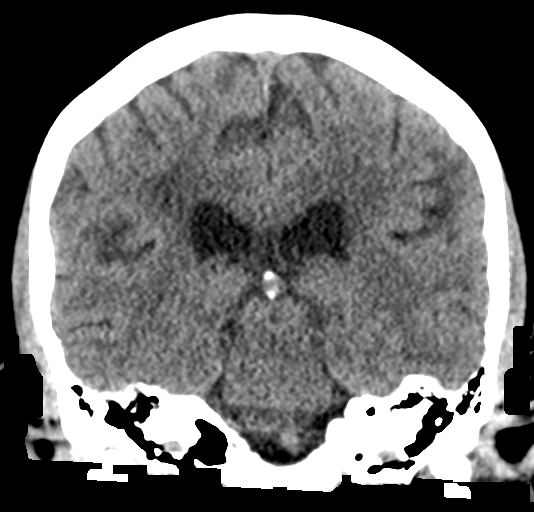
[im 37/66  brain]
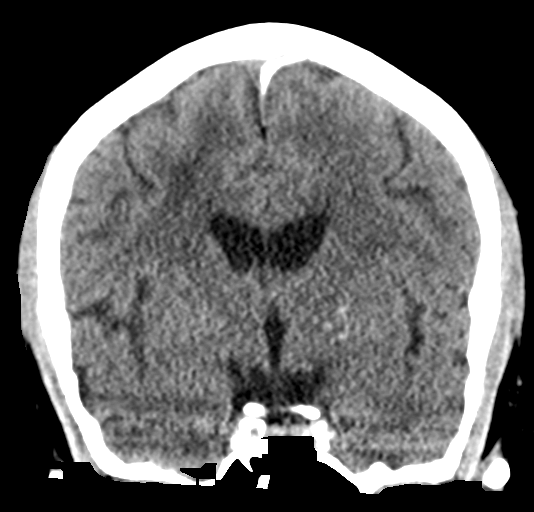

[Series 5: sagittal soft tissue · sagittal · 0.28mm/px · 3 of 50 slices shown]
[im 17/50  brain]
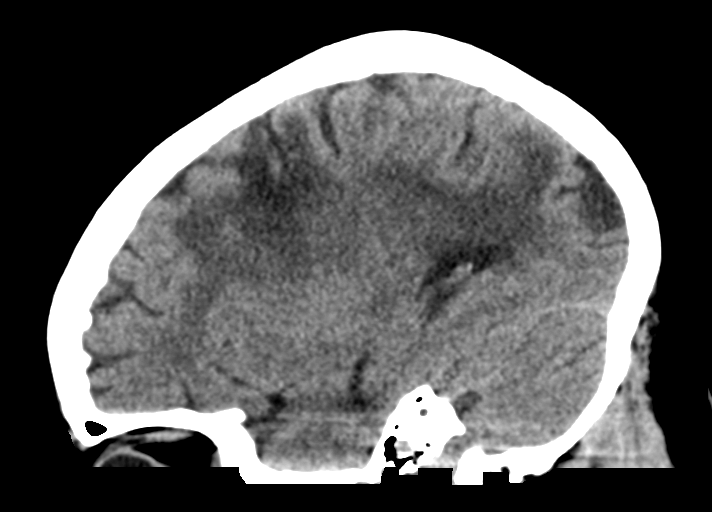
[im 25/50  brain]
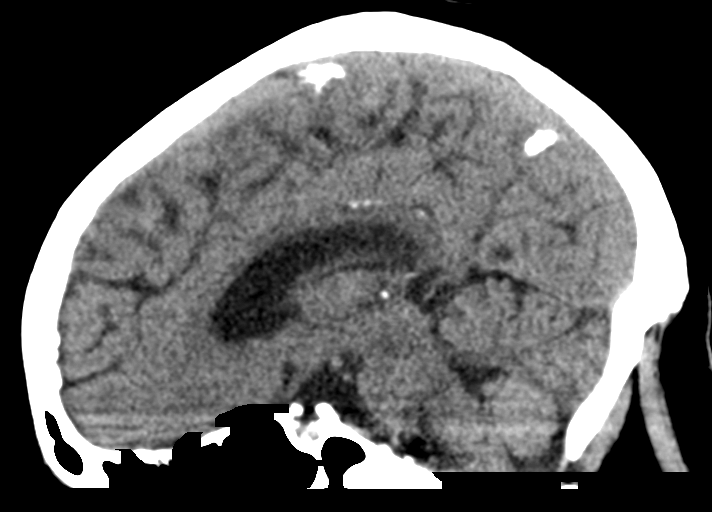
[im 33/50  brain]
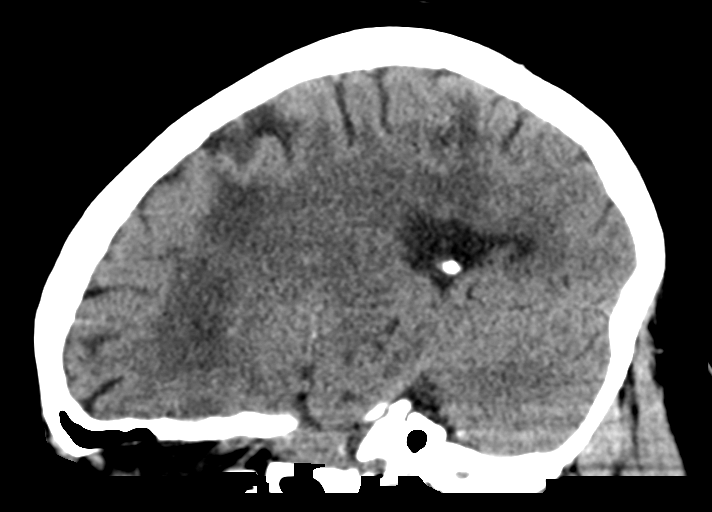

[16 of 47 positions shown; findings below may reference images not displayed]

FINDINGS: Brain: Patchy remote cortically based infarcts that are small to
moderate size and seen along the bilateral frontal and parietal
convexities, roughly along the MCA watershed distribution. Small
remote right cerebellar infarct. No evidence of acute or interval
infarction. No hemorrhage, hydrocephalus, or masslike finding.

Vascular: Atherosclerotic calcification

Skull: Normal. Negative for fracture or focal lesion.

Sinuses/Orbits: Negative
IMPRESSION: 1. No acute finding.  Stable from 1515.
2. Remote cerebral and cerebellar infarcts.

## 2022-01-20 IMAGING — CR DG CHEST 2V
2 series · 2 of 2 positions shown · non-contrast
Comparison: 01/18/2019

CLINICAL DATA: Weakness

EXAM:
CHEST - 2 VIEW

[chest pa]
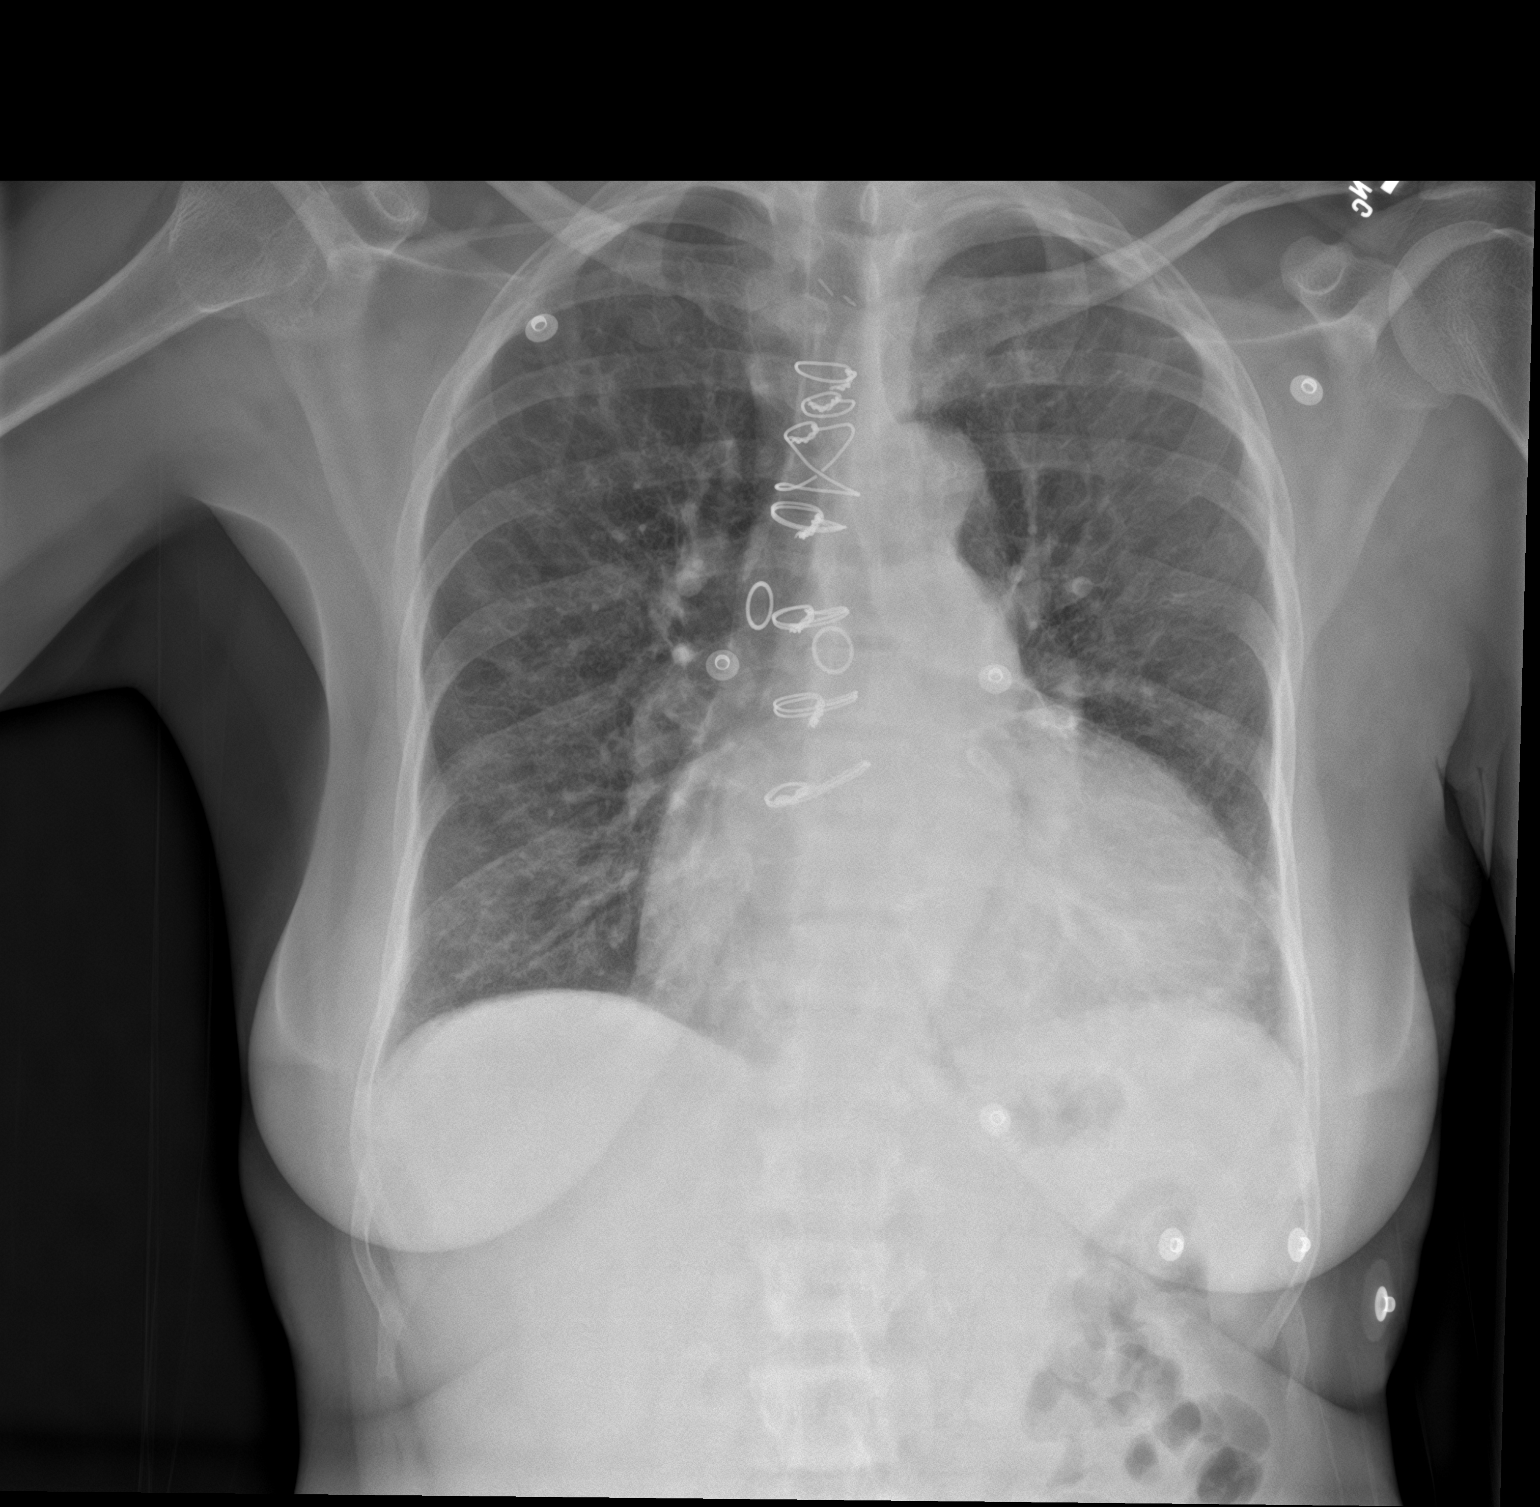

[chest lat]
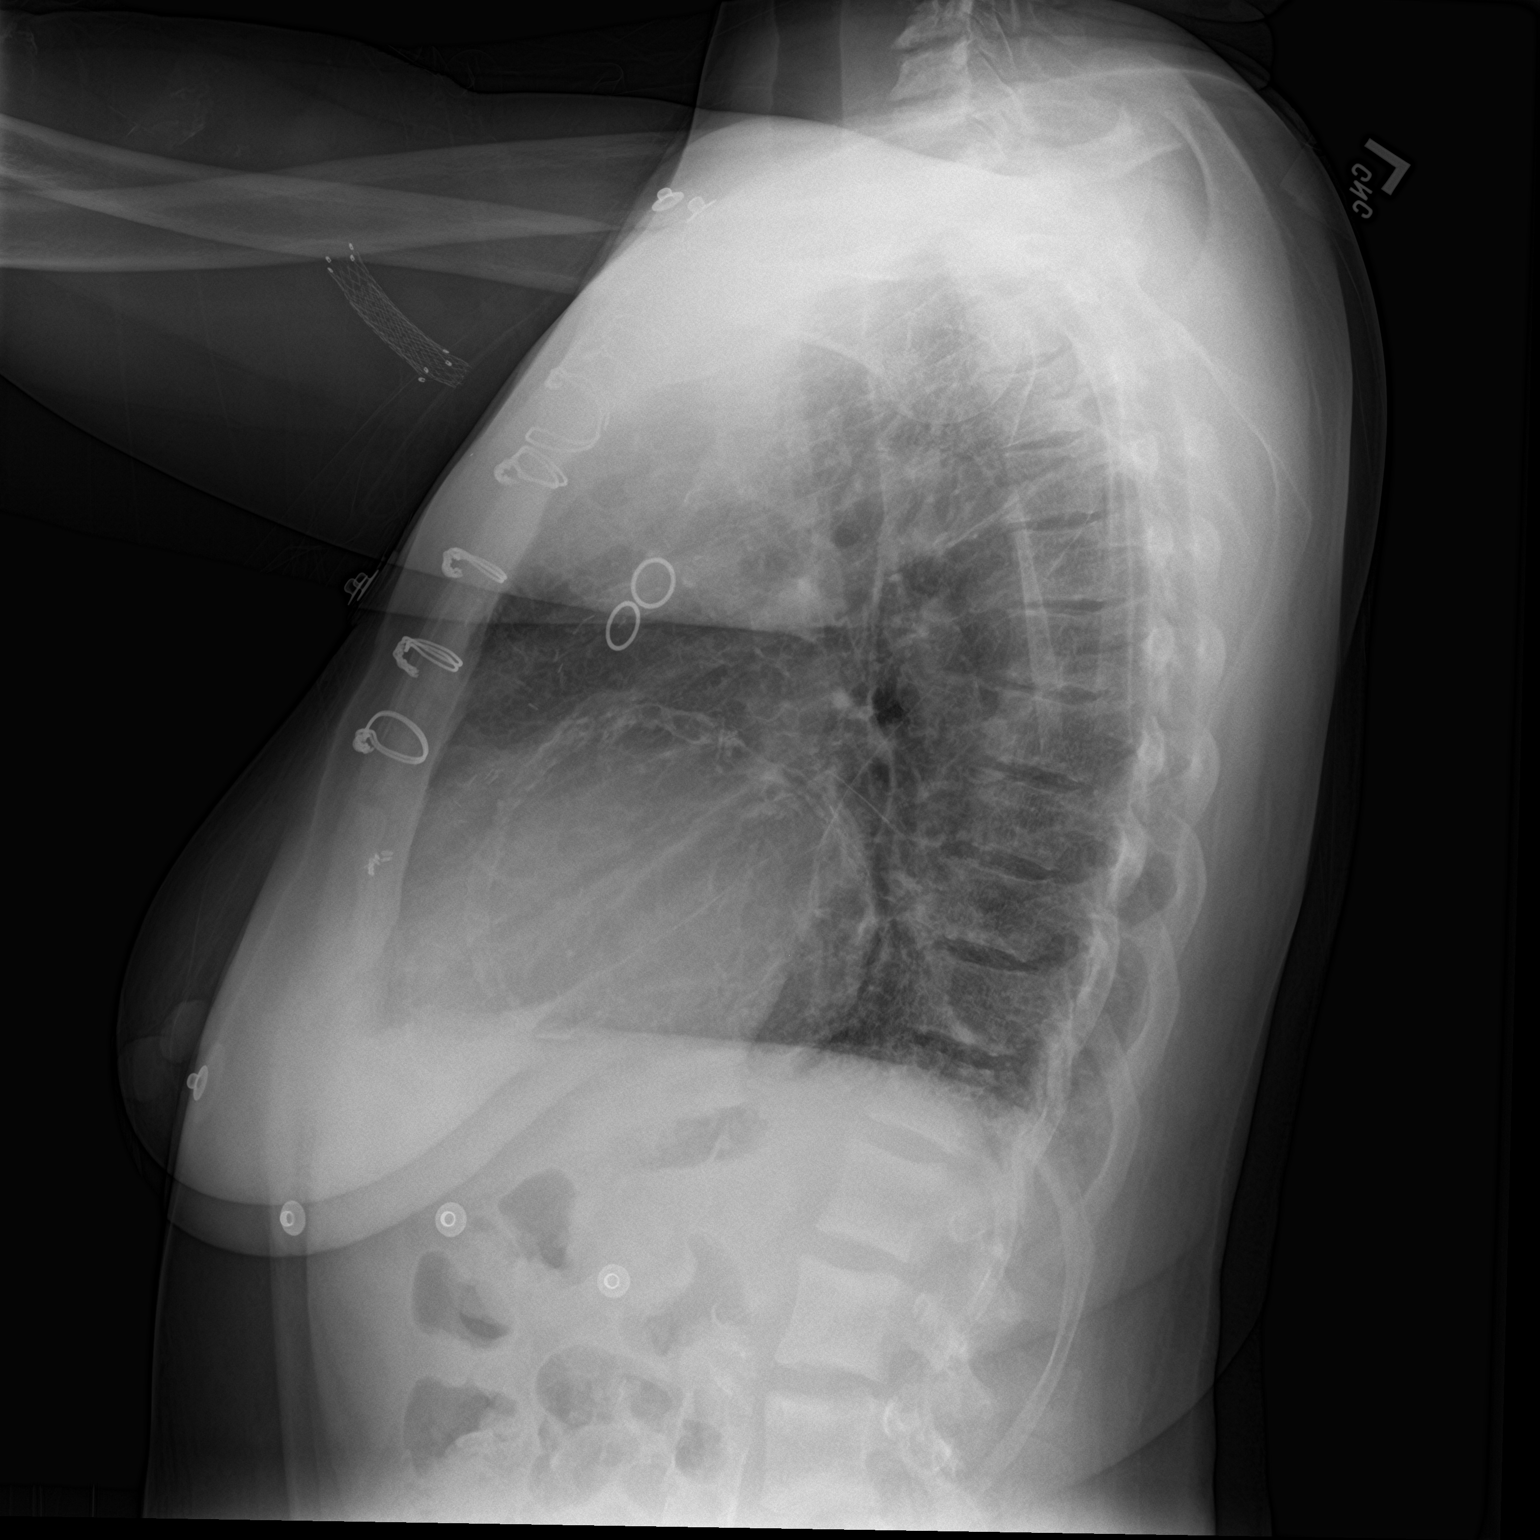

[2 of 2 positions shown; findings below may reference images not displayed]

FINDINGS: Chronic cardiomegaly. Prior CABG and extensive coronary
stenting/calcification. Chronic interstitial coarsening. There is no
edema, consolidation, effusion, or pneumothorax. Renal
osteodystrophy with multilevel endplate sclerosis
IMPRESSION: 1. No acute finding.
2. Chronic cardiomegaly.

## 2022-01-21 DIAGNOSIS — M8588 Other specified disorders of bone density and structure, other site: Secondary | ICD-10-CM | POA: Diagnosis not present

## 2022-01-21 DIAGNOSIS — N186 End stage renal disease: Secondary | ICD-10-CM | POA: Diagnosis not present

## 2022-01-21 DIAGNOSIS — Z992 Dependence on renal dialysis: Secondary | ICD-10-CM | POA: Diagnosis not present

## 2022-01-23 DIAGNOSIS — N186 End stage renal disease: Secondary | ICD-10-CM | POA: Diagnosis not present

## 2022-01-23 DIAGNOSIS — I509 Heart failure, unspecified: Secondary | ICD-10-CM | POA: Diagnosis not present

## 2022-01-23 DIAGNOSIS — Z992 Dependence on renal dialysis: Secondary | ICD-10-CM | POA: Diagnosis not present

## 2022-01-26 DIAGNOSIS — N186 End stage renal disease: Secondary | ICD-10-CM | POA: Diagnosis not present

## 2022-01-26 DIAGNOSIS — Z992 Dependence on renal dialysis: Secondary | ICD-10-CM | POA: Diagnosis not present

## 2022-01-28 DIAGNOSIS — N186 End stage renal disease: Secondary | ICD-10-CM | POA: Diagnosis not present

## 2022-01-28 DIAGNOSIS — Z992 Dependence on renal dialysis: Secondary | ICD-10-CM | POA: Diagnosis not present

## 2022-01-29 ENCOUNTER — Ambulatory Visit: Payer: Medicare Other | Admitting: General Practice

## 2022-01-29 ENCOUNTER — Encounter
Admission: RE | Disposition: A | Discharge status: Home / Self Care | Payer: Self-pay | Source: Home / Self Care | Attending: Vascular Surgery

## 2022-01-29 ENCOUNTER — Other Ambulatory Visit: Payer: Self-pay

## 2022-01-29 ENCOUNTER — Encounter: Payer: Self-pay | Admitting: Vascular Surgery

## 2022-01-29 ENCOUNTER — Ambulatory Visit
Admission: RE | Admit: 2022-01-29 | Discharge: 2022-01-29 | Disposition: A | Discharge status: Home / Self Care | Payer: Medicare Other | Attending: Vascular Surgery | Admitting: Vascular Surgery

## 2022-01-29 ENCOUNTER — Ambulatory Visit: Payer: Medicare Other

## 2022-01-29 DIAGNOSIS — N186 End stage renal disease: Secondary | ICD-10-CM | POA: Diagnosis not present

## 2022-01-29 DIAGNOSIS — I132 Hypertensive heart and chronic kidney disease with heart failure and with stage 5 chronic kidney disease, or end stage renal disease: Secondary | ICD-10-CM | POA: Diagnosis not present

## 2022-01-29 DIAGNOSIS — I5042 Chronic combined systolic (congestive) and diastolic (congestive) heart failure: Secondary | ICD-10-CM | POA: Diagnosis not present

## 2022-01-29 DIAGNOSIS — E039 Hypothyroidism, unspecified: Secondary | ICD-10-CM | POA: Diagnosis not present

## 2022-01-29 DIAGNOSIS — Z7901 Long term (current) use of anticoagulants: Secondary | ICD-10-CM | POA: Diagnosis not present

## 2022-01-29 DIAGNOSIS — Z87891 Personal history of nicotine dependence: Secondary | ICD-10-CM | POA: Diagnosis not present

## 2022-01-29 DIAGNOSIS — I480 Paroxysmal atrial fibrillation: Secondary | ICD-10-CM | POA: Diagnosis not present

## 2022-01-29 DIAGNOSIS — I5043 Acute on chronic combined systolic (congestive) and diastolic (congestive) heart failure: Secondary | ICD-10-CM | POA: Diagnosis not present

## 2022-01-29 DIAGNOSIS — Z79890 Hormone replacement therapy (postmenopausal): Secondary | ICD-10-CM | POA: Diagnosis not present

## 2022-01-29 DIAGNOSIS — Y841 Kidney dialysis as the cause of abnormal reaction of the patient, or of later complication, without mention of misadventure at the time of the procedure: Secondary | ICD-10-CM | POA: Insufficient documentation

## 2022-01-29 DIAGNOSIS — Z79899 Other long term (current) drug therapy: Secondary | ICD-10-CM | POA: Diagnosis not present

## 2022-01-29 DIAGNOSIS — E785 Hyperlipidemia, unspecified: Secondary | ICD-10-CM | POA: Insufficient documentation

## 2022-01-29 DIAGNOSIS — J9611 Chronic respiratory failure with hypoxia: Secondary | ICD-10-CM | POA: Diagnosis not present

## 2022-01-29 DIAGNOSIS — Z951 Presence of aortocoronary bypass graft: Secondary | ICD-10-CM | POA: Insufficient documentation

## 2022-01-29 DIAGNOSIS — J811 Chronic pulmonary edema: Secondary | ICD-10-CM | POA: Diagnosis not present

## 2022-01-29 DIAGNOSIS — Z992 Dependence on renal dialysis: Secondary | ICD-10-CM | POA: Diagnosis not present

## 2022-01-29 DIAGNOSIS — I2510 Atherosclerotic heart disease of native coronary artery without angina pectoris: Secondary | ICD-10-CM | POA: Insufficient documentation

## 2022-01-29 DIAGNOSIS — F17210 Nicotine dependence, cigarettes, uncomplicated: Secondary | ICD-10-CM | POA: Diagnosis not present

## 2022-01-29 DIAGNOSIS — D631 Anemia in chronic kidney disease: Secondary | ICD-10-CM | POA: Insufficient documentation

## 2022-01-29 DIAGNOSIS — R0602 Shortness of breath: Secondary | ICD-10-CM | POA: Diagnosis not present

## 2022-01-29 DIAGNOSIS — T82858A Stenosis of vascular prosthetic devices, implants and grafts, initial encounter: Secondary | ICD-10-CM | POA: Insufficient documentation

## 2022-01-29 DIAGNOSIS — Z8673 Personal history of transient ischemic attack (TIA), and cerebral infarction without residual deficits: Secondary | ICD-10-CM | POA: Diagnosis not present

## 2022-01-29 DIAGNOSIS — Z8619 Personal history of other infectious and parasitic diseases: Secondary | ICD-10-CM | POA: Diagnosis not present

## 2022-01-29 DIAGNOSIS — I130 Hypertensive heart and chronic kidney disease with heart failure and stage 1 through stage 4 chronic kidney disease, or unspecified chronic kidney disease: Secondary | ICD-10-CM | POA: Diagnosis not present

## 2022-01-29 HISTORY — PX: A/V FISTULAGRAM: CATH118298

## 2022-01-29 LAB — POTASSIUM (ARMC VASCULAR LAB ONLY): Potassium (ARMC vascular lab): 5.2 mmol/L — ABNORMAL HIGH (ref 3.5–5.1)

## 2022-01-29 SURGERY — A/V FISTULAGRAM
Anesthesia: General | Laterality: Left

## 2022-01-29 MED ORDER — FAMOTIDINE 20 MG PO TABS
40.0000 mg | ORAL_TABLET | Freq: Once | ORAL | Status: DC | PRN
Start: 2022-01-29 — End: 2022-01-29

## 2022-01-29 MED ORDER — SODIUM CHLORIDE 0.9 % IV SOLN: INTRAVENOUS | Status: DC

## 2022-01-29 MED ORDER — IPRATROPIUM-ALBUTEROL 0.5-2.5 (3) MG/3ML IN SOLN
3.0000 mL | Freq: Once | RESPIRATORY_TRACT | Status: AC
Start: 2022-01-29 — End: 2022-01-29

## 2022-01-29 MED ORDER — IPRATROPIUM-ALBUTEROL 0.5-2.5 (3) MG/3ML IN SOLN
RESPIRATORY_TRACT | Status: AC
  Administered 2022-01-29: 3 mL via RESPIRATORY_TRACT
  Filled 2022-01-29: qty 3

## 2022-01-29 MED ORDER — LIDOCAINE 2% (20 MG/ML) 5 ML SYRINGE
INTRAMUSCULAR | Status: DC | PRN
  Administered 2022-01-29: 50 mg via INTRAVENOUS

## 2022-01-29 MED ORDER — PROPOFOL 500 MG/50ML IV EMUL
INTRAVENOUS | Status: DC | PRN
  Administered 2022-01-29: 50 ug/kg/min via INTRAVENOUS

## 2022-01-29 MED ORDER — METHYLPREDNISOLONE SODIUM SUCC 125 MG IJ SOLR: 125.0000 mg | Freq: Once | INTRAMUSCULAR | Status: DC | PRN

## 2022-01-29 MED ORDER — FENTANYL CITRATE (PF) 100 MCG/2ML IJ SOLN
INTRAMUSCULAR | Status: AC
  Filled 2022-01-29: qty 2

## 2022-01-29 MED ORDER — MIDAZOLAM HCL 2 MG/2ML IJ SOLN
INTRAMUSCULAR | Status: AC
  Filled 2022-01-29: qty 2

## 2022-01-29 MED ORDER — HYDROMORPHONE HCL 1 MG/ML IJ SOLN: 1.0000 mg | Freq: Once | INTRAMUSCULAR | Status: DC | PRN

## 2022-01-29 MED ORDER — FENTANYL CITRATE (PF) 100 MCG/2ML IJ SOLN
INTRAMUSCULAR | Status: DC | PRN
  Administered 2022-01-29 (×2): 50 ug via INTRAVENOUS

## 2022-01-29 MED ORDER — GLYCOPYRROLATE 0.2 MG/ML IJ SOLN
INTRAMUSCULAR | Status: AC
  Filled 2022-01-29: qty 1

## 2022-01-29 MED ORDER — MIDAZOLAM HCL 2 MG/ML PO SYRP: 8.0000 mg | ORAL_SOLUTION | Freq: Once | ORAL | Status: DC | PRN

## 2022-01-29 MED ORDER — CEFAZOLIN SODIUM-DEXTROSE 1-4 GM/50ML-% IV SOLN
1.0000 g | INTRAVENOUS | Status: AC
Start: 2022-01-29 — End: 2022-01-29
  Administered 2022-01-29: 1 g via INTRAVENOUS

## 2022-01-29 MED ORDER — GLYCOPYRROLATE 0.2 MG/ML IJ SOLN
INTRAMUSCULAR | Status: DC | PRN
  Administered 2022-01-29: .2 mg via INTRAVENOUS

## 2022-01-29 MED ORDER — DIPHENHYDRAMINE HCL 50 MG/ML IJ SOLN: 50.0000 mg | Freq: Once | INTRAMUSCULAR | Status: DC | PRN

## 2022-01-29 MED ORDER — MIDAZOLAM HCL 5 MG/5ML IJ SOLN
INTRAMUSCULAR | Status: DC | PRN
  Administered 2022-01-29: 2 mg via INTRAVENOUS

## 2022-01-29 MED ORDER — DEXMEDETOMIDINE HCL IN NACL 200 MCG/50ML IV SOLN
INTRAVENOUS | Status: DC | PRN
  Administered 2022-01-29: 12 ug via INTRAVENOUS

## 2022-01-29 MED ORDER — CEFAZOLIN SODIUM-DEXTROSE 1-4 GM/50ML-% IV SOLN
INTRAVENOUS | Status: AC
  Filled 2022-01-29: qty 50

## 2022-01-29 MED ORDER — ONDANSETRON HCL 4 MG/2ML IJ SOLN: 4.0000 mg | Freq: Four times a day (QID) | INTRAMUSCULAR | Status: DC | PRN

## 2022-01-29 MED ORDER — IODIXANOL 320 MG/ML IV SOLN
INTRAVENOUS | Status: DC | PRN
  Administered 2022-01-29: 20 mL via INTRA_ARTERIAL

## 2022-01-29 SURGICAL SUPPLY — 11 items
BALLN LUTONIX AV 9X60X75 (BALLOONS) ×1
BALLN ULTRVRSE 9X60X75 (BALLOONS) ×1
BALLOON LUTONIX AV 9X60X75 (BALLOONS) IMPLANT
BALLOON ULTRVRSE 9X60X75 (BALLOONS) IMPLANT
CANNULA 5F STIFF (CANNULA) IMPLANT
DRAPE BRACHIAL (DRAPES) IMPLANT
GLIDEWIRE ADV .035X180CM (WIRE) IMPLANT
KIT ENCORE 26 ADVANTAGE (KITS) IMPLANT
PACK ANGIOGRAPHY (CUSTOM PROCEDURE TRAY) ×1 IMPLANT
SHEATH BRITE TIP 6FRX5.5 (SHEATH) IMPLANT
SUT MNCRL AB 4-0 PS2 18 (SUTURE) IMPLANT

## 2022-01-29 NOTE — Interval H&P Note (Signed)
History and Physical Interval Note:  01/29/2022 11:40 AM  [name]  has presented today for surgery, with the diagnosis of L arm fistulagram  ANESTHESIA    End Stage Renal.  The various methods of treatment have been discussed with the patient and family. After consideration of risks, benefits and other options for treatment, the patient has consented to  Procedure(s): A/V Fistulagram (Left) as a surgical intervention.  The patient's history has been reviewed, patient examined, no change in status, stable for surgery.  I have reviewed the patient's chart and labs.  Questions were answered to the patient's satisfaction.     Leotis Pain

## 2022-01-29 NOTE — Anesthesia Preprocedure Evaluation (Signed)
Anesthesia Evaluation  Patient identified by MRN, date of birth, ID band Patient awake    Reviewed: Allergy & Precautions, H&P , NPO status , Patient's Chart, lab work & pertinent test results, reviewed documented beta blocker date and time   Airway Mallampati: III  TM Distance: >3 FB Neck ROM: full    Dental  (+) Chipped   Pulmonary neg pulmonary ROS, Patient abstained from smoking., former smoker,  CXR 9/22: Negative   Pulmonary exam normal        Cardiovascular Exercise Tolerance: Poor hypertension, On Medications (-) angina (denies recent agina)+ CAD, + Past MI, + Cardiac Stents, + CABG (x3) and +CHF (Last known LVEF of 25 - 30% from a 2D echocardiogram which was done in February, 2021)  negative cardio ROS Normal cardiovascular exam(-) dysrhythmias + Valvular Problems/Murmurs  Rhythm:regular Rate:Normal  Cardiology increased Ranexa and nitroglycerin in response to recent complaints of chest pains.  Cardiac cath 2020: All grafts patent, including LIMA to LAD, SVG to Om and PDA, with extesive worsening of native disease            Prox RCA lesion is 100% stenosed.            Mid LAD lesion is 100% stenosed.            Prox Cx to Mid Cx lesion is 90% stenosed.       EKG 02/2021: Sinus rhythm with 1st degree A-V block Possible Left atrial enlargement Left axis deviation Right bundle branch block Possible Lateral infarct , age undetermined Inferior infarct , age undetermined Abnormal ECG  ECHO 07/2020: 1. Left ventricular ejection fraction, by estimation, is 25 to 30%. The  left ventricle has severely decreased function. The left ventricle  demonstrates global hypokinesis. The left ventricular internal cavity size  was severely dilated. There is moderate  concentric left ventricular hypertrophy. Left ventricular diastolic  parameters are consistent with Grade III diastolic dysfunction  (restrictive).  2. Right  ventricular systolic function is mildly reduced. The right  ventricular size is mildly enlarged. Mildly increased right ventricular  wall thickness.  3. Left atrial size was severely dilated.  4. Right atrial size was mild to moderately dilated.  5. The mitral valve is degenerative. Mild mitral valve regurgitation.  6. The tricuspid valve is degenerative. Tricuspid valve regurgitation is  mild to moderate.  7. The aortic valve is calcified. Aortic valve regurgitation is not  visualized.    Neuro/Psych Dementia No Residual Symptoms negative neurological ROS  negative psych ROS   GI/Hepatic negative GI ROS, Neg liver ROS, Medicated,History of hepatitis C with liver cirrhosis  Gastritis on recent EGD. 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) No date: Hepatitis     Comment:  history of hep c No date: History of bipolar disorder 02/21/2014: History of  CVA (cerebrovascular accident) No date: Hyperlipidemia No date: Hypertension 02/24/2014: Hypotension No date: Myocardial infarction [facility]) No date: Obesity No date: Paroxysmal atrial fibrillation (HCC) No date: Peripheral vascular disease ([place]) No date: Renal failure No date: Renal insufficiency 2011: Stroke ([place]) 03/14/2014: Superficial  incisional surgical site infection  Past Surgical History: 03/30/2019: A/V FISTULAGRAM; Left     Comment:  Procedure: A/V FISTULAGRAM;  Surgeon: Algernon Huxley, MD;               Location: [place] [facility];  Service: Cardiovascular;              Laterality: Left; 10/06/2019: A/V FISTULAGRAM; Left     Comment:  Procedure: A/V FISTULAGRAM;  Surgeon: Algernon Huxley, MD;               Location: [place] [facility];  Service: Cardiovascular;              Laterality: Left; 05/19/2021: A/V FISTULAGRAM; Left     Comment:  Procedure: A/V FISTULAGRAM;  Surgeon: Algernon Huxley, MD;               Location: [place] [facility];  Service: Cardiovascular;              Laterality: Left; No date: COLONOSCOPY 04/22/2021: COLONOSCOPY WITH PROPOFOL; N/A     Comment:  Procedure: COLONOSCOPY WITH PROPOFOL;  Surgeon:               Lesly Rubenstein, MD;  Location: [facility] ENDOSCOPY;                Service: Endoscopy;  Laterality: N/A; 2013: CORONARY ANGIOPLASTY WITH STENT PLACEMENT No date: CORONARY ARTERY BYPASS GRAFT No date: DIALYSIS FISTULA CREATION No date: ESOPHAGOGASTRODUODENOSCOPY 04/22/2021: ESOPHAGOGASTRODUODENOSCOPY; N/A     Comment:  Procedure: ESOPHAGOGASTRODUODENOSCOPY (EGD);  Surgeon:               Lesly Rubenstein, MD;  Location: [facility] ENDOSCOPY;                Service: Endoscopy;  Laterality: N/A; 02/23/2019: FLEXIBLE SIGMOIDOSCOPY; N/A     Comment:  Procedure: FLEXIBLE SIGMOIDOSCOPY;  Surgeon: Lollie Sails, MD;  Location: [facility] ENDOSCOPY;  Service:               Endoscopy;  Laterality: N/A; 08/08/2018: LEFT HEART CATH AND CORS/GRAFTS ANGIOGRAPHY; N/A     Comment:  Procedure: LEFT HEART CATH AND CORS/GRAFTS ANGIOGRAPHY;               Surgeon: Dionisio David, MD;  Location: [employer] [facility]              [facility];  Service: Cardiovascular;  Laterality: N/A; 01/30/2019: LEFT HEART CATH AND CORS/GRAFTS ANGIOGRAPHY; N/A     Comment:  Procedure: LEFT HEART CATH AND CORS/GRAFTS  ANGIOGRAPHY;               Surgeon: Dionisio David, MD;  Location: [place] [facility]              [facility];  Service: Cardiovascular;  Laterality: N/A; No date: percutaneous insertion intra aortic balloon right cath No date: ultrasound guided pericardiocentesis     Reproductive/Obstetrics negative OB ROS                            Anesthesia Physical  Anesthesia  Plan  ASA: 4  Anesthesia Plan: General   Post-op Pain Management: Minimal or no pain anticipated   Induction: Intravenous  PONV Risk Score and Plan: 3 and Propofol infusion, TIVA and Ondansetron  Airway Management Planned: Nasal Cannula  Additional Equipment: None  Intra-op Plan:   Post-operative Plan:   Informed Consent: I have reviewed the patients History and Physical, chart, labs and discussed the procedure including the risks, benefits and alternatives for the proposed anesthesia with the patient or authorized representative who has indicated his/her understanding and acceptance.     Dental advisory given  Plan Discussed with: CRNA and Surgeon  Anesthesia Plan Comments: (Discussed risks of anesthesia with patient, including possibility of difficulty with spontaneous ventilation under anesthesia necessitating airway intervention, PONV, and rare risks such as cardiac or respiratory or neurological events, and allergic reactions. Discussed the role of CRNA in patient's perioperative care. Patient understands.)       Anesthesia Quick Evaluation

## 2022-01-29 NOTE — Op Note (Signed)
[facility]    OPERATIVE NOTE   PROCEDURE: 1.   Left brachiobasilic arteriovenous fistula cannulation under ultrasound guidance 2.   Left arm fistulagram including central venogram 3.   Percutaneous transluminal angioplasty of jump graft and the axillary vein both with 9 mm diameter by 6 cm length angioplasty balloon  PRE-OPERATIVE DIAGNOSIS: 1. ESRD 2. Poorly functional left brachiobasilic AVF with previous jump graft revision  POST-OPERATIVE DIAGNOSIS: same as above   SURGEON: Leotis Pain, MD  ANESTHESIA: local with MCS  ESTIMATED BLOOD LOSS: 5 cc  FINDING(S): 75 to 80% stenosis in the midportion of the jump graft at the access site.  50 to 60% stenosis in the axillary vein.  The remainder of the central venous circulation was widely patent  SPECIMEN(S):  None  CONTRAST: 20 cc  FLUORO TIME: 1.3 minutes  Anesthesia: MAC  INDICATIONS: [name] is a 61 y.o. female who presents with malfunctioning left brachiobasilic arteriovenous fistula.  This is already had a surgical jump graft the patient is scheduled for left arm fistulagram.  The patient is aware the risks include but are not limited to: bleeding, infection, thrombosis of the cannulated access, and possible anaphylactic reaction to the contrast.  The patient is aware of the risks of the procedure and elects to proceed forward.  DESCRIPTION: After full informed written consent was obtained, the patient was brought back to the angiography suite and placed supine upon the angiography table.  The patient was connected to monitoring equipment. Moderate conscious sedation was administered with a face to face encounter with the patient throughout the procedure with my supervision of the RN administering medicines and monitoring the patient's vital signs and mental status throughout from the start of the procedure until the patient was taken to the recovery room. The left arm was prepped and draped in  the standard fashion for a percutaneous access intervention.  Under ultrasound guidance, the left brachiobasilic arteriovenous fistula was cannulated with a micropuncture needle under direct ultrasound guidance where it was patent and a permanent image was performed.  The microwire was advanced into the fistula and the needle was exchanged for the a microsheath.  I then upsized to a 6 Fr Sheath and imaging was performed.  Hand injections were completed to image the access including the central venous system. This demonstrated 75 to 80% stenosis in the midportion of the jump graft at the access site.  50 to 60% stenosis in the axillary vein.  The remainder of the central venous circulation was widely patent.  Based on the images, this patient will need intervention to these areas. I then gave the patient 3000 units of intravenous heparin.  I then crossed the stenoses with an Advantage wire.  Based on the imaging, a 9 mm x 6 cm   angioplasty balloon was selected.  The balloon was centered around the access site stenosis and inflated to 10 ATM for 1 minute(s).  It was then advanced to the axillary vein and again inflated this time up to 8 atm for 1 minute.  On completion imaging, a 20-25% residual stenosis was present at both locations.     Based on the completion imaging, no further intervention is necessary.  The wire and balloon were removed from the sheath.  A 4-0 Monocryl purse-string suture was sewn around the sheath.  The sheath was removed while tying down the suture.  A sterile bandage was applied to the puncture site.  COMPLICATIONS: None  CONDITION: Stable  Leotis Pain  01/29/2022 2:37 PM   This note was created with Dragon Medical transcription system. Any errors in dictation are purely unintentional.

## 2022-01-29 NOTE — Transfer of Care (Signed)
Immediate Anesthesia Transfer of Care Note  Patient: [name]  Procedure(s) Performed: A/V Fistulagram (Left)  Patient Location: spu  Anesthesia Type:General  Level of Consciousness: drowsy  Airway & Oxygen Therapy: Patient Spontanous Breathing  Post-op Assessment: Report given to RN and Post -op Vital signs reviewed and stable  Post vital signs: Reviewed  Last Vitals:  Vitals Value Taken Time  BP    Temp    Pulse    Resp    SpO2      Last Pain:  Vitals:   01/29/22 1151  TempSrc: Oral  PainSc: 0-No pain         Complications: There were no known notable events for this encounter.

## 2022-01-29 NOTE — Anesthesia Postprocedure Evaluation (Signed)
Anesthesia Post Note  Patient: [name]  Procedure(s) Performed: A/V Fistulagram (Left)  Patient location during evaluation: [facility] Recovery Anesthesia Type: General Level of consciousness: awake and alert Pain management: pain level controlled Vital Signs Assessment: post-procedure vital signs reviewed and stable Respiratory status: spontaneous breathing, nonlabored ventilation, respiratory function stable and patient connected to nasal cannula oxygen Cardiovascular status: blood pressure returned to baseline and stable Postop Assessment: no apparent nausea or vomiting Anesthetic complications: no   There were no known notable events for this encounter.   Last Vitals:  Vitals:   01/29/22 1600 01/29/22 1615  BP: 114/85 120/87  Pulse: 87 93  Resp: 16 14  Temp:    SpO2: 100% (!) 74%    Last Pain:  Vitals:   01/29/22 1615  TempSrc:   PainSc: 0-No pain                 Precious Haws Marceil Welp

## 2022-01-30 ENCOUNTER — Encounter
Admission: EM | Disposition: A | Discharge status: Home / Self Care | Payer: Self-pay | Source: Home / Self Care | Attending: Emergency Medicine

## 2022-01-30 ENCOUNTER — Emergency Department: Payer: Medicare Other

## 2022-01-30 ENCOUNTER — Emergency Department
Admission: EM | Admit: 2022-01-30 | Discharge: 2022-01-30 | Disposition: A | Discharge status: Home / Self Care | Payer: Medicare Other | Attending: Emergency Medicine | Admitting: Emergency Medicine

## 2022-01-30 ENCOUNTER — Ambulatory Visit: Payer: Medicare Other

## 2022-01-30 ENCOUNTER — Encounter: Payer: Self-pay | Admitting: Vascular Surgery

## 2022-01-30 DIAGNOSIS — N186 End stage renal disease: Secondary | ICD-10-CM | POA: Insufficient documentation

## 2022-01-30 DIAGNOSIS — R188 Other ascites: Secondary | ICD-10-CM | POA: Insufficient documentation

## 2022-01-30 DIAGNOSIS — Z7901 Long term (current) use of anticoagulants: Secondary | ICD-10-CM | POA: Diagnosis not present

## 2022-01-30 DIAGNOSIS — I509 Heart failure, unspecified: Secondary | ICD-10-CM | POA: Insufficient documentation

## 2022-01-30 DIAGNOSIS — R0789 Other chest pain: Secondary | ICD-10-CM | POA: Insufficient documentation

## 2022-01-30 DIAGNOSIS — I132 Hypertensive heart and chronic kidney disease with heart failure and with stage 5 chronic kidney disease, or end stage renal disease: Secondary | ICD-10-CM | POA: Insufficient documentation

## 2022-01-30 DIAGNOSIS — R109 Unspecified abdominal pain: Secondary | ICD-10-CM | POA: Diagnosis not present

## 2022-01-30 DIAGNOSIS — R079 Chest pain, unspecified: Secondary | ICD-10-CM | POA: Diagnosis not present

## 2022-01-30 DIAGNOSIS — I2510 Atherosclerotic heart disease of native coronary artery without angina pectoris: Secondary | ICD-10-CM | POA: Insufficient documentation

## 2022-01-30 DIAGNOSIS — Z992 Dependence on renal dialysis: Secondary | ICD-10-CM | POA: Diagnosis not present

## 2022-01-30 DIAGNOSIS — R1084 Generalized abdominal pain: Secondary | ICD-10-CM | POA: Diagnosis not present

## 2022-01-30 DIAGNOSIS — Z951 Presence of aortocoronary bypass graft: Secondary | ICD-10-CM | POA: Insufficient documentation

## 2022-01-30 DIAGNOSIS — I499 Cardiac arrhythmia, unspecified: Secondary | ICD-10-CM | POA: Diagnosis not present

## 2022-01-30 DIAGNOSIS — I700 Atherosclerosis of aorta: Secondary | ICD-10-CM | POA: Diagnosis not present

## 2022-01-30 LAB — CBC WITH DIFFERENTIAL/PLATELET
Abs Immature Granulocytes: 0.02 10*3/uL (ref 0.00–0.07)
Basophils Absolute: 0 10*3/uL (ref 0.0–0.1)
Basophils Relative: 1 %
Eosinophils Absolute: 0.1 10*3/uL (ref 0.0–0.5)
Eosinophils Relative: 3 %
HCT: 32.8 % — ABNORMAL LOW (ref 36.0–46.0)
Hemoglobin: 10.1 g/dL — ABNORMAL LOW (ref 12.0–15.0)
Immature Granulocytes: 1 %
Lymphocytes Relative: 30 %
Lymphs Abs: 1.1 10*3/uL (ref 0.7–4.0)
MCH: 30.9 pg (ref 26.0–34.0)
MCHC: 30.8 g/dL (ref 30.0–36.0)
MCV: 100.3 fL — ABNORMAL HIGH (ref 80.0–100.0)
Monocytes Absolute: 0.6 10*3/uL (ref 0.1–1.0)
Monocytes Relative: 15 %
Neutro Abs: 2 10*3/uL (ref 1.7–7.7)
Neutrophils Relative %: 50 %
Platelets: 127 10*3/uL — ABNORMAL LOW (ref 150–400)
RBC: 3.27 MIL/uL — ABNORMAL LOW (ref 3.87–5.11)
RDW: 15.9 % — ABNORMAL HIGH (ref 11.5–15.5)
WBC: 3.8 10*3/uL — ABNORMAL LOW (ref 4.0–10.5)
nRBC: 0 % (ref 0.0–0.2)

## 2022-01-30 LAB — COMPREHENSIVE METABOLIC PANEL
ALT: 11 U/L (ref 0–44)
AST: 23 U/L (ref 15–41)
Albumin: 3.6 g/dL (ref 3.5–5.0)
Alkaline Phosphatase: 68 U/L (ref 38–126)
Anion gap: 10 (ref 5–15)
BUN: 20 mg/dL (ref 6–20)
CO2: 25 mmol/L (ref 22–32)
Calcium: 8.9 mg/dL (ref 8.9–10.3)
Chloride: 105 mmol/L (ref 98–111)
Creatinine, Ser: 4.59 mg/dL — ABNORMAL HIGH (ref 0.44–1.00)
GFR, Estimated: 10 mL/min — ABNORMAL LOW (ref >60)
Glucose, Bld: 85 mg/dL (ref 70–99)
Potassium: 3.9 mmol/L (ref 3.5–5.1)
Sodium: 140 mmol/L (ref 135–145)
Total Bilirubin: 0.7 mg/dL (ref 0.3–1.2)
Total Protein: 6.9 g/dL (ref 6.5–8.1)

## 2022-01-30 LAB — GLUCOSE, PLEURAL OR PERITONEAL FLUID: Glucose, Fluid: 94 mg/dL

## 2022-01-30 LAB — TROPONIN I (HIGH SENSITIVITY)
Troponin I (High Sensitivity): 37 ng/L — ABNORMAL HIGH (ref <18)
Troponin I (High Sensitivity): 41 ng/L — ABNORMAL HIGH (ref <18)

## 2022-01-30 LAB — BODY FLUID CELL COUNT WITH DIFFERENTIAL
Eos, Fluid: 1 %
Lymphs, Fluid: 85 %
Monocyte-Macrophage-Serous Fluid: 7 %
Neutrophil Count, Fluid: 6 %
Other Cells, Fluid: 1 %
Total Nucleated Cell Count, Fluid: 2264 cu mm

## 2022-01-30 LAB — PROTEIN, PLEURAL OR PERITONEAL FLUID: Total protein, fluid: 5.2 g/dL

## 2022-01-30 LAB — MAGNESIUM: Magnesium: 2.2 mg/dL (ref 1.7–2.4)

## 2022-01-30 LAB — LIPASE, BLOOD: Lipase: 38 U/L (ref 11–51)

## 2022-01-30 SURGERY — CORONARY/GRAFT ACUTE MI REVASCULARIZATION
Anesthesia: Moderate Sedation

## 2022-01-30 MED ORDER — IOHEXOL 300 MG/ML  SOLN
100.0000 mL | Freq: Once | INTRAMUSCULAR | Status: AC | PRN
Start: 2022-01-30 — End: 2022-01-30
  Administered 2022-01-30: 100 mL via INTRAVENOUS

## 2022-01-30 NOTE — ED Triage Notes (Signed)
Patient brought in via ems from dialysis with complaint of chest pain. Patient c/o chest pain bilat arm pain. Patient was seen yesterday. Patient completed 2hrs and 15 min of dialysis today and is due for a paracentesis today. Ems gave 324 ASA

## 2022-01-30 NOTE — ED Provider Notes (Signed)
Patient received in signout from Dr. Charna Archer pending follow-up cell count on paracentesis.  Cell count is not consistent with SBP.  She does appear stable and appropriate for outpatient follow-up.   Vanessa Lot, MD 01/30/22 1745

## 2022-01-30 NOTE — ED Provider Notes (Signed)
[facility] Provider Note    Event Date/Time   First MD Initiated Contact with Patient 01/30/22 [phone]     (approximate)   History   Chief Complaint Chest Pain   HPI  [name] is a 61 y.o. female with past medical history of hypertension, CAD status post CABG, CHF, atrial fibrillation on Eliquis, ESRD on HD (MWF), and hep C status posttreatment who presents to the ED complaining of chest pain.  Patient reports that she was getting her usual dialysis treatment today when about 2 hours into the treatment she began having sharp pain in the center of her chest.  She points towards her epigastrium and states it seems to move upwards in her chest from there.  She endorses chronic shortness of breath, no worse than usual.  She denies any recent fevers or cough.  Initial EKG showed possible ST elevation and prehospital code STEMI was activated.  Patient given 324 mg of aspirin prior to arrival with no improvement in pain.  Patient additionally endorses pain across her entire abdomen, states it has been increasingly more swollen and she is due for paracentesis today.  She denies any fevers, nausea, vomiting, or changes in bowel movements.  She does not make urine.     Physical Exam   Triage Vital Signs: ED Triage Vitals  Enc Vitals Group     BP 01/30/22 0948 109/82     Pulse Rate 01/30/22 0948 90     Resp 01/30/22 0948 15     Temp 01/30/22 0948 98.1 F (36.7 C)     Temp Source 01/30/22 0948 Oral     SpO2 --      Weight 01/30/22 0950 158 lb (71.7 kg)     Height --      Head Circumference --      Peak Flow --      Pain Score 01/30/22 0950 10     Pain Loc --      Pain Edu? --      Excl. in [place]? --     Most recent vital signs: Vitals:   01/30/22 1454 01/30/22 1524  BP: 110/82 106/77  Pulse: 91 88  Resp:    Temp:    SpO2: 100% 100%    Constitutional: Alert and oriented. Eyes: Conjunctivae are normal. Head: Atraumatic. Nose: No  congestion/rhinnorhea. Mouth/Throat: Mucous membranes are moist.  Cardiovascular: Normal rate, regular rhythm. Grossly normal heart sounds.  2+ radial pulses bilaterally.  Left upper extremity fistula with palpable thrill. Respiratory: Normal respiratory effort.  No retractions. Lungs CTAB.  Diffuse chest wall tenderness to palpation noted. Gastrointestinal: Soft and diffusely tender to palpation with associated distention and fluid wave. Musculoskeletal: No lower extremity tenderness nor edema.  Neurologic:  Normal speech and language. No gross focal neurologic deficits are appreciated.    ED Results / Procedures / Treatments   Labs (all labs ordered are listed, but only abnormal results are displayed) Labs Reviewed  CBC WITH DIFFERENTIAL/PLATELET - Abnormal; Notable for the following components:      Result Value   WBC 3.8 (*)    RBC 3.27 (*)    Hemoglobin 10.1 (*)    HCT 32.8 (*)    MCV 100.3 (*)    RDW 15.9 (*)    Platelets 127 (*)    All other components within normal limits  COMPREHENSIVE METABOLIC PANEL - Abnormal; Notable for the following components:   Creatinine, Ser 4.59 (*)    GFR, Estimated  10 (*)    All other components within normal limits  TROPONIN I (HIGH SENSITIVITY) - Abnormal; Notable for the following components:   Troponin I (High Sensitivity) 37 (*)    All other components within normal limits  TROPONIN I (HIGH SENSITIVITY) - Abnormal; Notable for the following components:   Troponin I (High Sensitivity) 41 (*)    All other components within normal limits  BODY FLUID CULTURE W GRAM STAIN  LIPASE, BLOOD  MAGNESIUM  BODY FLUID CELL COUNT WITH DIFFERENTIAL  PROTEIN, PLEURAL OR PERITONEAL FLUID  GLUCOSE, PLEURAL OR PERITONEAL FLUID     EKG  ED ECG REPORT I, Blake Divine, the attending physician, personally viewed and interpreted this ECG.   Date: 01/30/2022  EKG Time: 9:41  Rate: 90  Rhythm: normal sinus rhythm, occasional PVCs  Axis: Normal   Intervals:right bundle branch block  ST&T Change: None  ED ECG REPORT I, Blake Divine, the attending physician, personally viewed and interpreted this ECG.   Date: 01/30/2022  EKG Time: 10:53  Rate: 91  Rhythm: normal sinus rhythm, frequent PVC's noted  Axis: Normal  Intervals:right bundle branch block  ST&T Change: None   RADIOLOGY Chest x-ray reviewed and interpreted by me with no infiltrate, edema, or effusion.  PROCEDURES:  Critical Care performed: No  Procedures   MEDICATIONS ORDERED IN ED: Medications  iohexol (OMNIPAQUE) 300 MG/ML solution 100 mL (100 mLs Intravenous Contrast Given 01/30/22 1206)     IMPRESSION / MDM / ASSESSMENT AND PLAN / ED COURSE  I reviewed the triage vital signs and the nursing notes.                              61 y.o. female with past medical history of hypertension, CAD status post CABG, CHF, atrial fibrillation on Eliquis, ESRD on HD (MWF), and hep C status posttreatment who presents to the ED with acute onset sharp pain in the center of her chest while at dialysis earlier today, additionally complains of diffuse abdominal pain and distention.  Patient's presentation is most consistent with acute presentation with potential threat to life or bodily function.  Differential diagnosis includes, but is not limited to, ACS, PE, pneumonia, pneumothorax, CHF exacerbation, SBP, bowel obstruction.  Patient nontoxic-appearing and in no acute distress, vital signs are unremarkable.  Prehospital EKG showed possible STEMI and code STEMI was activated by EMS.  Initial EKG reviewed by me and does not appear consistent with STEMI, does show right bundle branch block with occasional PVCs.  Dr. Saralyn Pilar of cardiology at bedside on arrival, EKG here in the ED again does not appear consistent with STEMI and code STEMI was canceled.  Her pain is reproducible with palpation and seems atypical for ACS, plan to screen 2 sets of troponin.  Chest x-ray is  unremarkable, low suspicion for PE given reassuring vital signs with reproducible pain.  She does have diffuse abdominal pain and distention, suspect this is from large volume ascites.  We will check CT of her abdomen/pelvis, if this is unremarkable she may undergo paracentesis.  Troponin mildly elevated but similar to prior baseline, on recheck remained stable.  On reassessment, patient denies any ongoing chest pain and low suspicion for ACS at this time given atypical pain reproduced with palpation.  CT of abdomen/pelvis shows large ascites as well as signs of cardiac enlargement with associated enlargement of her IVC.  Patient has known severe cardiomyopathy and right heart failure seen on  echocardiogram last month, findings on CT do not appear acute and unlikely to be contributing to or resulting from her chest pain.  Her abdominal pain is a likely due to large volume ascites and patient to receive paracentesis today.  Given significant tenderness, we will check cell count to ensure no SBP.  If this is unremarkable, patient may be discharged home with outpatient follow-up.  Patient turned over to oncoming provider pending paracentesis.      FINAL CLINICAL IMPRESSION(S) / ED DIAGNOSES   Final diagnoses:  Atypical chest pain  Generalized abdominal pain  Other ascites     Rx / DC Orders   ED Discharge Orders     None        Note:  This document was prepared using Dragon voice recognition software and may include unintentional dictation errors.   Blake Divine, MD 01/30/22 [phone]

## 2022-01-30 NOTE — Procedures (Signed)
PROCEDURE SUMMARY:  Successful US guided diagnostic and therapeutic paracentesis from [facility].  Yielded 1.4 L of clear, amber fluid.  No immediate complications.  Pt tolerated well.   Specimen was sent for labs.  EBL < 1 mL  Tyson Alias, AGNP 01/30/2022 3:48 PM

## 2022-02-02 DIAGNOSIS — Z992 Dependence on renal dialysis: Secondary | ICD-10-CM | POA: Diagnosis not present

## 2022-02-02 DIAGNOSIS — N186 End stage renal disease: Secondary | ICD-10-CM | POA: Diagnosis not present

## 2022-02-02 LAB — PATHOLOGIST SMEAR REVIEW

## 2022-02-03 LAB — BODY FLUID CULTURE W GRAM STAIN: Culture: NO GROWTH

## 2022-02-04 DIAGNOSIS — Z992 Dependence on renal dialysis: Secondary | ICD-10-CM | POA: Diagnosis not present

## 2022-02-04 DIAGNOSIS — N186 End stage renal disease: Secondary | ICD-10-CM | POA: Diagnosis not present

## 2022-02-05 ENCOUNTER — Telehealth: Payer: Self-pay | Admitting: *Deleted

## 2022-02-05 NOTE — Telephone Encounter (Signed)
     Patient  visit on 01/30/2022  at [facility] was for SOB  Have you been able to follow up with your primary care physician? Yes  The patient was able to obtain any needed medicine or equipment.  Are there diet recommendations that you are having difficulty following? Is working cutting down her fluid intake   Patient expresses understanding of discharge instructions and education provided has no other needs at this time.    [place] [phone] [address] , [place] [address] Email : [email or web address]

## 2022-02-06 DIAGNOSIS — Z992 Dependence on renal dialysis: Secondary | ICD-10-CM | POA: Diagnosis not present

## 2022-02-06 DIAGNOSIS — N186 End stage renal disease: Secondary | ICD-10-CM | POA: Diagnosis not present

## 2022-02-09 DIAGNOSIS — Z992 Dependence on renal dialysis: Secondary | ICD-10-CM | POA: Diagnosis not present

## 2022-02-09 DIAGNOSIS — N186 End stage renal disease: Secondary | ICD-10-CM | POA: Diagnosis not present

## 2022-02-11 DIAGNOSIS — Z992 Dependence on renal dialysis: Secondary | ICD-10-CM | POA: Diagnosis not present

## 2022-02-11 DIAGNOSIS — N186 End stage renal disease: Secondary | ICD-10-CM | POA: Diagnosis not present

## 2022-02-12 DIAGNOSIS — I120 Hypertensive chronic kidney disease with stage 5 chronic kidney disease or end stage renal disease: Secondary | ICD-10-CM | POA: Diagnosis not present

## 2022-02-12 DIAGNOSIS — E785 Hyperlipidemia, unspecified: Secondary | ICD-10-CM | POA: Diagnosis not present

## 2022-02-12 DIAGNOSIS — Z992 Dependence on renal dialysis: Secondary | ICD-10-CM | POA: Diagnosis not present

## 2022-02-12 DIAGNOSIS — N186 End stage renal disease: Secondary | ICD-10-CM | POA: Diagnosis not present

## 2022-02-13 DIAGNOSIS — Z992 Dependence on renal dialysis: Secondary | ICD-10-CM | POA: Diagnosis not present

## 2022-02-13 DIAGNOSIS — N186 End stage renal disease: Secondary | ICD-10-CM | POA: Diagnosis not present

## 2022-02-16 ENCOUNTER — Emergency Department (HOSPITAL_COMMUNITY)
Admission: EM | Admit: 2022-02-16 | Discharge: 2022-02-17 | Disposition: A | Discharge status: Home / Self Care | Payer: Medicare Other | Attending: Emergency Medicine | Admitting: Emergency Medicine

## 2022-02-16 ENCOUNTER — Emergency Department (HOSPITAL_COMMUNITY): Payer: Medicare Other

## 2022-02-16 DIAGNOSIS — R6889 Other general symptoms and signs: Secondary | ICD-10-CM | POA: Diagnosis not present

## 2022-02-16 DIAGNOSIS — K8020 Calculus of gallbladder without cholecystitis without obstruction: Secondary | ICD-10-CM | POA: Diagnosis not present

## 2022-02-16 DIAGNOSIS — E875 Hyperkalemia: Secondary | ICD-10-CM | POA: Diagnosis not present

## 2022-02-16 DIAGNOSIS — S0081XA Abrasion of other part of head, initial encounter: Secondary | ICD-10-CM | POA: Insufficient documentation

## 2022-02-16 DIAGNOSIS — Y9301 Activity, walking, marching and hiking: Secondary | ICD-10-CM | POA: Diagnosis not present

## 2022-02-16 DIAGNOSIS — Z20822 Contact with and (suspected) exposure to covid-19: Secondary | ICD-10-CM | POA: Diagnosis not present

## 2022-02-16 DIAGNOSIS — S0003XA Contusion of scalp, initial encounter: Secondary | ICD-10-CM | POA: Diagnosis not present

## 2022-02-16 DIAGNOSIS — R0789 Other chest pain: Secondary | ICD-10-CM | POA: Diagnosis not present

## 2022-02-16 DIAGNOSIS — Y92009 Unspecified place in unspecified non-institutional (private) residence as the place of occurrence of the external cause: Secondary | ICD-10-CM | POA: Insufficient documentation

## 2022-02-16 DIAGNOSIS — W01198A Fall on same level from slipping, tripping and stumbling with subsequent striking against other object, initial encounter: Secondary | ICD-10-CM | POA: Insufficient documentation

## 2022-02-16 DIAGNOSIS — Z992 Dependence on renal dialysis: Secondary | ICD-10-CM | POA: Insufficient documentation

## 2022-02-16 DIAGNOSIS — R42 Dizziness and giddiness: Secondary | ICD-10-CM | POA: Diagnosis present

## 2022-02-16 DIAGNOSIS — R079 Chest pain, unspecified: Secondary | ICD-10-CM | POA: Diagnosis not present

## 2022-02-16 DIAGNOSIS — R222 Localized swelling, mass and lump, trunk: Secondary | ICD-10-CM | POA: Diagnosis not present

## 2022-02-16 DIAGNOSIS — R404 Transient alteration of awareness: Secondary | ICD-10-CM | POA: Diagnosis not present

## 2022-02-16 DIAGNOSIS — N186 End stage renal disease: Secondary | ICD-10-CM | POA: Insufficient documentation

## 2022-02-16 DIAGNOSIS — W19XXXA Unspecified fall, initial encounter: Secondary | ICD-10-CM

## 2022-02-16 DIAGNOSIS — K5900 Constipation, unspecified: Secondary | ICD-10-CM | POA: Insufficient documentation

## 2022-02-16 DIAGNOSIS — R0602 Shortness of breath: Secondary | ICD-10-CM | POA: Diagnosis not present

## 2022-02-16 DIAGNOSIS — M4316 Spondylolisthesis, lumbar region: Secondary | ICD-10-CM | POA: Diagnosis not present

## 2022-02-16 DIAGNOSIS — Z043 Encounter for examination and observation following other accident: Secondary | ICD-10-CM | POA: Diagnosis not present

## 2022-02-16 DIAGNOSIS — R1900 Intra-abdominal and pelvic swelling, mass and lump, unspecified site: Secondary | ICD-10-CM | POA: Diagnosis not present

## 2022-02-16 DIAGNOSIS — Z743 Need for continuous supervision: Secondary | ICD-10-CM | POA: Diagnosis not present

## 2022-02-16 LAB — CBC
HCT: 39.9 % (ref 36.0–46.0)
Hemoglobin: 12.6 g/dL (ref 12.0–15.0)
MCH: 31.4 pg (ref 26.0–34.0)
MCHC: 31.6 g/dL (ref 30.0–36.0)
MCV: 99.5 fL (ref 80.0–100.0)
Platelets: 146 10*3/uL — ABNORMAL LOW (ref 150–400)
RBC: 4.01 MIL/uL (ref 3.87–5.11)
RDW: 16.5 % — ABNORMAL HIGH (ref 11.5–15.5)
WBC: 6.5 10*3/uL (ref 4.0–10.5)
nRBC: 0 % (ref 0.0–0.2)

## 2022-02-16 LAB — COMPREHENSIVE METABOLIC PANEL
ALT: 10 U/L (ref 0–44)
AST: 21 U/L (ref 15–41)
Albumin: 3.5 g/dL (ref 3.5–5.0)
Alkaline Phosphatase: 63 U/L (ref 38–126)
Anion gap: 18 — ABNORMAL HIGH (ref 5–15)
BUN: 42 mg/dL — ABNORMAL HIGH (ref 6–20)
CO2: 21 mmol/L — ABNORMAL LOW (ref 22–32)
Calcium: 7.8 mg/dL — ABNORMAL LOW (ref 8.9–10.3)
Chloride: 100 mmol/L (ref 98–111)
Creatinine, Ser: 8.22 mg/dL — ABNORMAL HIGH (ref 0.44–1.00)
GFR, Estimated: 5 mL/min — ABNORMAL LOW (ref >60)
Glucose, Bld: 75 mg/dL (ref 70–99)
Potassium: 6.5 mmol/L (ref 3.5–5.1)
Sodium: 139 mmol/L (ref 135–145)
Total Bilirubin: 1 mg/dL (ref 0.3–1.2)
Total Protein: 6.7 g/dL (ref 6.5–8.1)

## 2022-02-16 LAB — PROTIME-INR
INR: 1.6 — ABNORMAL HIGH (ref 0.8–1.2)
Prothrombin Time: 18.8 seconds — ABNORMAL HIGH (ref 11.4–15.2)

## 2022-02-16 LAB — RESP PANEL BY RT-PCR (FLU A&B, COVID) ARPGX2
Influenza A by PCR: NEGATIVE
Influenza B by PCR: NEGATIVE
SARS Coronavirus 2 by RT PCR: NEGATIVE

## 2022-02-16 MED ORDER — LIDOCAINE-PRILOCAINE 2.5-2.5 % EX CREA
1.0000 | TOPICAL_CREAM | CUTANEOUS | Status: DC | PRN
Start: 2022-02-16 — End: 2022-02-17

## 2022-02-16 MED ORDER — PENTAFLUOROPROP-TETRAFLUOROETH EX AERO: 1.0000 | INHALATION_SPRAY | CUTANEOUS | Status: DC | PRN

## 2022-02-16 MED ORDER — ALTEPLASE 2 MG IJ SOLR: 2.0000 mg | Freq: Once | INTRAMUSCULAR | Status: DC | PRN

## 2022-02-16 MED ORDER — ANTICOAGULANT SODIUM CITRATE 4% (200MG/5ML) IV SOLN: 5.0000 mL | Status: DC | PRN

## 2022-02-16 MED ORDER — FLUORESCEIN SODIUM 1 MG OP STRP
1.0000 | ORAL_STRIP | Freq: Once | OPHTHALMIC | Status: AC
  Administered 2022-02-16: 1 via OPHTHALMIC
  Filled 2022-02-16: qty 1

## 2022-02-16 MED ORDER — INSULIN ASPART 100 UNIT/ML IV SOLN
5.0000 [IU] | Freq: Once | INTRAVENOUS | Status: AC
  Administered 2022-02-16: 5 [IU] via INTRAVENOUS

## 2022-02-16 MED ORDER — CHLORHEXIDINE GLUCONATE CLOTH 2 % EX PADS: 6.0000 | MEDICATED_PAD | Freq: Every day | CUTANEOUS | Status: DC

## 2022-02-16 MED ORDER — SODIUM ZIRCONIUM CYCLOSILICATE 10 G PO PACK
10.0000 g | PACK | Freq: Once | ORAL | Status: AC
  Administered 2022-02-16: 10 g via ORAL
  Filled 2022-02-16: qty 1

## 2022-02-16 MED ORDER — CALCIUM GLUCONATE-NACL 1-0.675 GM/50ML-% IV SOLN
1.0000 g | Freq: Once | INTRAVENOUS | Status: AC
  Administered 2022-02-16: 1000 mg via INTRAVENOUS
  Filled 2022-02-16: qty 50

## 2022-02-16 MED ORDER — FENTANYL CITRATE PF 50 MCG/ML IJ SOSY
50.0000 ug | PREFILLED_SYRINGE | INTRAMUSCULAR | Status: AC | PRN
  Administered 2022-02-16 – 2022-02-17 (×3): 50 ug via INTRAVENOUS
  Filled 2022-02-16 (×3): qty 1

## 2022-02-16 MED ORDER — DEXTROSE 50 % IV SOLN
1.0000 | Freq: Once | INTRAVENOUS | Status: AC
  Administered 2022-02-16: 50 mL via INTRAVENOUS
  Filled 2022-02-16: qty 50

## 2022-02-16 MED ORDER — HEPARIN SODIUM (PORCINE) 1000 UNIT/ML DIALYSIS: 1000.0000 [IU] | INTRAMUSCULAR | Status: DC | PRN

## 2022-02-16 MED ORDER — LIDOCAINE HCL (PF) 1 % IJ SOLN
5.0000 mL | INTRAMUSCULAR | Status: DC | PRN
Start: 2022-02-16 — End: 2022-02-17

## 2022-02-16 MED ORDER — TETRACAINE HCL 0.5 % OP SOLN
1.0000 [drp] | Freq: Once | OPHTHALMIC | Status: AC
  Administered 2022-02-16: 1 [drp] via OPHTHALMIC
  Filled 2022-02-16: qty 4

## 2022-02-16 MED ORDER — IOHEXOL 300 MG/ML  SOLN
100.0000 mL | Freq: Once | INTRAMUSCULAR | Status: AC | PRN
  Administered 2022-02-16: 100 mL via INTRAVENOUS

## 2022-02-16 NOTE — ED Provider Notes (Signed)
[facility] Provider Note   CSN: [id number] Arrival date & time: 02/16/22  1727     History Chief Complaint  Patient presents with   Trauma    HPI [name] is a 61 y.o. female presenting for ground-level fall.  Patient states that she skipped dialysis today and was feeling short of breath and dizzy.  She was walking around her house and got tangled up in her oxygen cord and fell.  She fell over to her right side and hit her forehead.  She denies fevers or chills nausea or vomiting syncope or shortness of breath otherwise.  Patient otherwise ambulatory tolerating p.o. intake prior to this fall.  Patient does take blood thinners and was treated as a level 2 trauma evaluation.  Patient has ESRD and is on midodrine daily for low blood pressure.  Patient's recorded medical, surgical, social, medication list and allergies were reviewed in the Snapshot window as part of the initial history.   Review of Systems   Review of Systems  Constitutional:  Negative for chills and fever.  HENT:  Negative for ear pain and sore throat.   Eyes:  Negative for pain and visual disturbance.  Respiratory:  Negative for cough and shortness of breath.   Cardiovascular:  Negative for chest pain and palpitations.  Gastrointestinal:  Negative for abdominal pain and vomiting.  Genitourinary:  Negative for dysuria and hematuria.  Musculoskeletal:  Negative for arthralgias and back pain.  Skin:  Negative for color change and rash.  Neurological:  Negative for seizures and syncope.  All other systems reviewed and are negative.   Physical Exam Updated Vital Signs BP (!) 129/94 (BP Location: Right Arm)   Pulse 63   Temp 98.3 F (36.8 C) (Oral)   Resp (!) 26   Ht '5\' 6"'$  (1.676 m)   Wt 70.8 kg   SpO2 97%   BMI 25.18 kg/m  Physical Exam Vitals and nursing note reviewed.  Constitutional:      General: She is not in acute distress.    Appearance: She is  well-developed.  HENT:     Head: Normocephalic and atraumatic.  Eyes:     Conjunctiva/sclera: Conjunctivae normal.  Cardiovascular:     Rate and Rhythm: Normal rate and regular rhythm.     Heart sounds: No murmur heard. Pulmonary:     Effort: Pulmonary effort is normal. No respiratory distress.     Breath sounds: Normal breath sounds.  Abdominal:     General: There is no distension.     Palpations: Abdomen is soft.     Tenderness: There is no abdominal tenderness. There is no right CVA tenderness or left CVA tenderness.  Musculoskeletal:        General: Signs of injury (Soft tissue hematoma over the right eye, soft tissue abrasion to the forehead.  No midline spinal tenderness.  IOP 20 with 95% confidence interval, negative Seidel) present. No swelling or tenderness. Normal range of motion.     Cervical back: Neck supple.  Skin:    General: Skin is warm and dry.  Neurological:     General: No focal deficit present.     Mental Status: She is alert and oriented to person, place, and time. Mental status is at baseline.     Cranial Nerves: No cranial nerve deficit.      ED Course/ Medical Decision Making/ A&P Clinical Course as of 02/16/22 2358  Mon Feb 16, 2022  1924 Cts and  Labs. Trauma eval. IOP 20/95% CI [CC]    Clinical Course User Index [CC] Tretha Sciara, MD    Procedures .Critical Care  Performed by: Tretha Sciara, MD Authorized by: Tretha Sciara, MD   Critical care provider statement:    Critical care time (minutes):  30   Critical care was necessary to treat or prevent imminent or life-threatening deterioration of the following conditions:  Trauma, metabolic crisis and renal failure   Critical care was time spent personally by me on the following activities:  Development of treatment plan with patient or surrogate, discussions with consultants, evaluation of patient's response to treatment, examination of patient, ordering and review of laboratory  studies, ordering and review of radiographic studies, ordering and performing treatments and interventions, pulse oximetry, re-evaluation of patient's condition and review of old charts    Medications Ordered in ED Medications  fentaNYL (SUBLIMAZE) injection 50 mcg (50 mcg Intravenous Given 02/16/22 2318)  calcium gluconate 1 g/ 50 mL sodium chloride IVPB (1,000 mg Intravenous New Bag/Given 02/16/22 2355)  sodium zirconium cyclosilicate (LOKELMA) packet 10 g (has no administration in time range)  Chlorhexidine Gluconate Cloth 2 % PADS 6 each (has no administration in time range)  pentafluoroprop-tetrafluoroeth (GEBAUERS) aerosol 1 Application (has no administration in time range)  lidocaine (PF) (XYLOCAINE) 1 % injection 5 mL (has no administration in time range)  lidocaine-prilocaine (EMLA) cream 1 Application (has no administration in time range)  heparin injection 1,000 Units (has no administration in time range)  anticoagulant sodium citrate solution 5 mL (has no administration in time range)  alteplase (CATHFLO ACTIVASE) injection 2 mg (has no administration in time range)  tetracaine (PONTOCAINE) 0.5 % ophthalmic solution 1 drop (1 drop Right Eye Given 02/16/22 1749)  fluorescein ophthalmic strip 1 strip (1 strip Right Eye Given 02/16/22 1748)  iohexol (OMNIPAQUE) 300 MG/ML solution 100 mL (100 mLs Intravenous Contrast Given 02/16/22 2016)  insulin aspart (novoLOG) injection 5 Units (5 Units Intravenous Given 02/16/22 2317)    And  dextrose 50 % solution 50 mL (50 mLs Intravenous Given 02/16/22 2317)   Medical Decision Making:    [name] is a 61 y.o. female who presented to the ED today with a moderate mechanisma trauma, detailed above.    Patient's presentation is complicated by their history of ESRD, medication noncompliance, missed dialysis.  Patient placed on continuous vitals and telemetry monitoring while in ED which was reviewed periodically.   Given this mechanism of trauma, a  full physical exam was performed. Notably, patient was hemodynamically stable in no acute distress.  Has a soft tissue swelling over her right eye.   Reviewed and confirmed nursing documentation for past medical history, family history, social history.    Initial Assessment/Plan:   This is a patient presenting with a moderate mechanism trauma.  As such, I have considered intracranial injuries including intracranial hemorrhage, intrathoracic injuries including blunt myocardial or blunt lung injury, blunt abdominal injuries including aortic dissection, bladder injury, spleen injury, liver injury and I have considered orthopedic injuries including extremity or spinal injury.  With the patient's presentation of moderate mechanism trauma and abnormalities detailed above, patient warrants aggressive evaluation for potential traumatic injuries. Will proceed with non-level trauma protocol to evaluate for potential injuries. Will proceed with CT Head, Cervical/Thoracic/Lumbar Spine, and Chest/Abdomen/Pelvis with contrast. Scans resulted with no acute pathology.  However, patient's laboratory results revealed hyperkalemia.  EKG without emergent changes.  Patient treated with insulin dextrose, calcium gluconate and arrange for emergent dialysis after  conversation with nephrology.  I do believe this patient would be stable for discharge if symptomatically improved after dialysis.  Patient's sister is at bedside and agrees that patient could be brought home in a.m. if patient clinically improves after dialysis. Patient currently sitting up in bed on way to dialysis.    Clinical Impression:  1. Fall, initial encounter   2. Hyperkalemia      Data Unavailable   Final Clinical Impression(s) / ED Diagnoses Final diagnoses:  Fall, initial encounter  Hyperkalemia    Rx / DC Orders ED Discharge Orders     None         Tretha Sciara, MD 02/16/22 2358

## 2022-02-16 NOTE — ED Notes (Signed)
Patient transported to CT 

## 2022-02-16 NOTE — Progress Notes (Signed)
Chaplain Medinas-Lockley responding to page for level 2 trauma for pt [name] for fall on thinners. Pt unavailable at time of visit and no family present at this time.  Chaplain services remain available for follow-up spiritual/emotional support as needed.  Ortencia Kick, [place]      02/16/22 1700  Clinical Encounter Type  Visited With Patient not available  Visit Type Initial;ED;Trauma

## 2022-02-16 NOTE — Progress Notes (Signed)
Informed of patient in ER. Presented with fall, on eliquis. Did not have HD as an outpatient today. HyperK, volume up as per ER. Will arrange for HD overnight. Patient to be reassessed post HD to determine dispo. Please call for formal consultation if patient is to be admitted. Discussed w/ HD staff and ER.  Please call with any questions/concerns.  Gean Quint, MD [facility]

## 2022-02-16 NOTE — ED Triage Notes (Signed)
Fall on thinners, dialysis pt

## 2022-02-16 NOTE — Progress Notes (Signed)
Orthopedic Tech Progress Note Patient Details:  [name] 1960/10/02 [id number]  Level 2 trauma, Ortho not needed at this time.  Patient ID: [name], female   DOB: June 20, 1960, 61 y.o.   MRN: [id number]  Arville Go 02/16/2022, 5:43 PM

## 2022-02-16 NOTE — ED Notes (Signed)
Trauma Response Nurse Documentation   [name] is a 61 y.o. female arriving to [facility] ED via [facility]  On Eliquis (apixaban) daily. Trauma was activated as a Level 2 by charge RN  based on the following trauma criteria Elderly patients > 65 with head trauma on anti-coagulation (excluding ASA). Trauma team at the bedside on patient arrival.   Patient cleared for CT by Dr. Oswald Hillock.  Delay in CT due to IV team starting IV and lab draws. Primary RN aware GCS 15.  History   Past Medical History:  Diagnosis Date   Adult behavior problems    frontal lobe CVA   Anemia    chronic disease   Atrial flutter by electrocardiogram ([place]) 03/22/2014   Cardiomyopathy ([place])    CHF (congestive heart failure) ([place])    Chicken pox    Coronary artery disease    Delayed surgical wound healing 04/12/2014   Overview:  Left leg   GERD (gastroesophageal reflux disease)    Heart failure (HCC)    Hepatitis    history of hep c   History of bipolar disorder    History of CVA (cerebrovascular accident) 02/21/2014   Hyperlipidemia    Hypertension    Hypotension 02/24/2014   Myocardial infarction [facility])    Obesity    Paroxysmal atrial fibrillation (HCC)    Peripheral vascular disease ([place])    Renal failure    Renal insufficiency    Stroke [facility]) 2011   Superficial incisional surgical site infection 03/14/2014     Past Surgical History:  Procedure Laterality Date   A/V FISTULAGRAM Left 03/30/2019   Procedure: A/V FISTULAGRAM;  Surgeon: Algernon Huxley, MD;  Location: [facility];  Service: Cardiovascular;  Laterality: Left;   A/V FISTULAGRAM Left 10/06/2019   Procedure: A/V FISTULAGRAM;  Surgeon: Algernon Huxley, MD;  Location: [facility];  Service: Cardiovascular;  Laterality: Left;   A/V FISTULAGRAM Left 05/19/2021   Procedure: A/V FISTULAGRAM;  Surgeon: Algernon Huxley, MD;  Location: [facility];  Service: Cardiovascular;  Laterality: Left;   A/V FISTULAGRAM Left  01/29/2022   Procedure: A/V Fistulagram;  Surgeon: Algernon Huxley, MD;  Location: [facility];  Service: Cardiovascular;  Laterality: Left;   COLONOSCOPY     COLONOSCOPY WITH PROPOFOL N/A 04/22/2021   Procedure: COLONOSCOPY WITH PROPOFOL;  Surgeon: Lesly Rubenstein, MD;  Location: [facility] ENDOSCOPY;  Service: Endoscopy;  Laterality: N/A;   CORONARY ANGIOPLASTY WITH STENT PLACEMENT  2013   CORONARY ARTERY BYPASS GRAFT     DIALYSIS FISTULA CREATION     ESOPHAGOGASTRODUODENOSCOPY     ESOPHAGOGASTRODUODENOSCOPY N/A 04/22/2021   Procedure: ESOPHAGOGASTRODUODENOSCOPY (EGD);  Surgeon: Lesly Rubenstein, MD;  Location: [facility] ENDOSCOPY;  Service: Endoscopy;  Laterality: N/A;   FLEXIBLE SIGMOIDOSCOPY N/A 02/23/2019   Procedure: FLEXIBLE SIGMOIDOSCOPY;  Surgeon: Lollie Sails, MD;  Location: [facility] ENDOSCOPY;  Service: Endoscopy;  Laterality: N/A;   LEFT HEART CATH AND CORS/GRAFTS ANGIOGRAPHY N/A 08/08/2018   Procedure: LEFT HEART CATH AND CORS/GRAFTS ANGIOGRAPHY;  Surgeon: Dionisio David, MD;  Location: [facility];  Service: Cardiovascular;  Laterality: N/A;   LEFT HEART CATH AND CORS/GRAFTS ANGIOGRAPHY N/A 01/30/2019   Procedure: LEFT HEART CATH AND CORS/GRAFTS ANGIOGRAPHY;  Surgeon: Dionisio David, MD;  Location: [facility];  Service: Cardiovascular;  Laterality: N/A;   percutaneous insertion intra aortic balloon right cath     ultrasound guided pericardiocentesis  Initial Focused Assessment (If applicable, or please see trauma documentation): Airway -- clear Breathing- unlabored Circulation - No bleeding noted-- hematoma over right eye.   CT's Completed:   CT Head, CT C-Spine, CT Chest w/ contrast, and CT abdomen/pelvis w/ contrast   Interventions:  IV - per IV team Labs CT scans Tono pen and flourescein stain for eye   Event Summary: TO ED via [facility]  from home-- tripped and fell- hitting right eye- swelling and hematoma over right  eye- is unable to open eye, but Dr was able to examine eye without difficulty.   Pt is a dialysis pt [facility]- did not go today due to holiday, will go tomorrow. Also has a hx of paracentesis a month ago- does have distended abd -   Dialysis graft in left upper arm.   Bedside handoff with ED RN Paden, RN and TRN [name]    Lezlie Octave Majesti Gambrell  Trauma Response RN  Please call TRN at [phone] for further assistance.

## 2022-02-17 DIAGNOSIS — E875 Hyperkalemia: Secondary | ICD-10-CM | POA: Diagnosis not present

## 2022-02-17 LAB — RENAL FUNCTION PANEL
Albumin: 3.5 g/dL (ref 3.5–5.0)
Anion gap: 14 (ref 5–15)
BUN: 43 mg/dL — ABNORMAL HIGH (ref 6–20)
CO2: 22 mmol/L (ref 22–32)
Calcium: 8.1 mg/dL — ABNORMAL LOW (ref 8.9–10.3)
Chloride: 102 mmol/L (ref 98–111)
Creatinine, Ser: 8.45 mg/dL — ABNORMAL HIGH (ref 0.44–1.00)
GFR, Estimated: 5 mL/min — ABNORMAL LOW (ref >60)
Glucose, Bld: 70 mg/dL (ref 70–99)
Phosphorus: 3.4 mg/dL (ref 2.5–4.6)
Potassium: 5.2 mmol/L — ABNORMAL HIGH (ref 3.5–5.1)
Sodium: 138 mmol/L (ref 135–145)

## 2022-02-17 LAB — CBC
HCT: 34.5 % — ABNORMAL LOW (ref 36.0–46.0)
Hemoglobin: 11 g/dL — ABNORMAL LOW (ref 12.0–15.0)
MCH: 31.3 pg (ref 26.0–34.0)
MCHC: 31.9 g/dL (ref 30.0–36.0)
MCV: 98 fL (ref 80.0–100.0)
Platelets: 140 10*3/uL — ABNORMAL LOW (ref 150–400)
RBC: 3.52 MIL/uL — ABNORMAL LOW (ref 3.87–5.11)
RDW: 16.1 % — ABNORMAL HIGH (ref 11.5–15.5)
WBC: 6.8 10*3/uL (ref 4.0–10.5)
nRBC: 0 % (ref 0.0–0.2)

## 2022-02-17 LAB — HEPATITIS B CORE ANTIBODY, TOTAL: Hep B Core Total Ab: NONREACTIVE

## 2022-02-17 LAB — HEPATITIS B SURFACE ANTIBODY,QUALITATIVE: Hep B S Ab: NONREACTIVE

## 2022-02-17 LAB — HEPATITIS C ANTIBODY: HCV Ab: REACTIVE — AB

## 2022-02-17 LAB — HEPATITIS B SURFACE ANTIGEN: Hepatitis B Surface Ag: NONREACTIVE

## 2022-02-17 NOTE — Progress Notes (Signed)
Received patient in stretcherto unit.  Alert and oriented.  Informed consent signed and in chart.   Treatment initiated: 0100 Treatment completed: 0400  Patient tolerated well.  Transported back to the room  Alert, without acute distress.  Hand-off given to patient's nurse.   Access used: graft Access issues: none  Total UF removed: 3000 Medication(s) given: none Post HD VS: 98.6 129/92,  96 18 Post HD weight: unble to obtain   [facility]

## 2022-02-17 NOTE — ED Provider Notes (Signed)
Patient returned from dialysis.  Based on dialysis notes, she had a full dialysis session.  Patient reports she tolerated well.  She is having some diffuse body pain related to her fall.  Vital signs reviewed and reassuring.  Physical Exam  BP 115/74   Pulse 95   Temp 98.6 F (37 C) (Oral)   Resp 18   Ht 1.676 m ('5\' 6"'$ )   Wt 70.8 kg   SpO2 98%   BMI 25.18 kg/m   Physical Exam Awake, alert, no acute distress Swelling right side of the face  Procedures  Procedures  ED Course / MDM   Clinical Course as of 02/17/22 0507  Mon Feb 16, 2022  1924 Cts and Labs. Trauma eval. IOP 20/95% CI [CC]    Clinical Course User Index [CC] Tretha Sciara, MD   Medical Decision Making Amount and/or Complexity of Data Reviewed Labs: ordered. Radiology: ordered.  Risk OTC drugs. Prescription drug management.   Patient discharged home to continue dialysis on her regular schedule.       Merryl Hacker, MD 02/17/22 [phone]

## 2022-02-17 NOTE — ED Notes (Signed)
Transported to dialysis.

## 2022-02-18 DIAGNOSIS — N186 End stage renal disease: Secondary | ICD-10-CM | POA: Diagnosis not present

## 2022-02-18 DIAGNOSIS — Z992 Dependence on renal dialysis: Secondary | ICD-10-CM | POA: Diagnosis not present

## 2022-02-18 LAB — HEPATITIS B SURFACE ANTIBODY, QUANTITATIVE: Hep B S AB Quant (Post): <3.1 m[IU]/mL — ABNORMAL LOW (ref >9.9)

## 2022-02-19 ENCOUNTER — Telehealth: Payer: Self-pay | Admitting: *Deleted

## 2022-02-20 DIAGNOSIS — N186 End stage renal disease: Secondary | ICD-10-CM | POA: Diagnosis not present

## 2022-02-20 DIAGNOSIS — Z992 Dependence on renal dialysis: Secondary | ICD-10-CM | POA: Diagnosis not present

## 2022-02-23 DIAGNOSIS — I509 Heart failure, unspecified: Secondary | ICD-10-CM | POA: Diagnosis not present

## 2022-02-23 DIAGNOSIS — N186 End stage renal disease: Secondary | ICD-10-CM | POA: Diagnosis not present

## 2022-02-23 DIAGNOSIS — Z992 Dependence on renal dialysis: Secondary | ICD-10-CM | POA: Diagnosis not present

## 2022-02-25 ENCOUNTER — Other Ambulatory Visit: Payer: Self-pay | Admitting: Internal Medicine

## 2022-02-25 DIAGNOSIS — N186 End stage renal disease: Secondary | ICD-10-CM | POA: Diagnosis not present

## 2022-02-25 DIAGNOSIS — R188 Other ascites: Secondary | ICD-10-CM

## 2022-02-25 DIAGNOSIS — Z992 Dependence on renal dialysis: Secondary | ICD-10-CM | POA: Diagnosis not present

## 2022-02-27 ENCOUNTER — Ambulatory Visit: Admission: RE | Admit: 2022-02-27 | Payer: Medicare Other | Source: Ambulatory Visit

## 2022-02-27 DIAGNOSIS — Z992 Dependence on renal dialysis: Secondary | ICD-10-CM | POA: Diagnosis not present

## 2022-02-27 DIAGNOSIS — N186 End stage renal disease: Secondary | ICD-10-CM | POA: Diagnosis not present

## 2022-03-02 DIAGNOSIS — N186 End stage renal disease: Secondary | ICD-10-CM | POA: Diagnosis not present

## 2022-03-02 DIAGNOSIS — Z992 Dependence on renal dialysis: Secondary | ICD-10-CM | POA: Diagnosis not present

## 2022-03-03 DIAGNOSIS — R55 Syncope and collapse: Secondary | ICD-10-CM | POA: Diagnosis not present

## 2022-03-03 DIAGNOSIS — I2510 Atherosclerotic heart disease of native coronary artery without angina pectoris: Secondary | ICD-10-CM | POA: Diagnosis not present

## 2022-03-03 DIAGNOSIS — F17200 Nicotine dependence, unspecified, uncomplicated: Secondary | ICD-10-CM | POA: Diagnosis not present

## 2022-03-03 DIAGNOSIS — I6389 Other cerebral infarction: Secondary | ICD-10-CM | POA: Diagnosis not present

## 2022-03-03 DIAGNOSIS — I509 Heart failure, unspecified: Secondary | ICD-10-CM | POA: Diagnosis not present

## 2022-03-03 DIAGNOSIS — I25810 Atherosclerosis of coronary artery bypass graft(s) without angina pectoris: Secondary | ICD-10-CM | POA: Diagnosis not present

## 2022-03-03 DIAGNOSIS — I340 Nonrheumatic mitral (valve) insufficiency: Secondary | ICD-10-CM | POA: Diagnosis not present

## 2022-03-03 DIAGNOSIS — I219 Acute myocardial infarction, unspecified: Secondary | ICD-10-CM | POA: Diagnosis not present

## 2022-03-03 DIAGNOSIS — I10 Essential (primary) hypertension: Secondary | ICD-10-CM | POA: Diagnosis not present

## 2022-03-03 DIAGNOSIS — E782 Mixed hyperlipidemia: Secondary | ICD-10-CM | POA: Diagnosis not present

## 2022-03-04 ENCOUNTER — Ambulatory Visit
Admission: RE | Admit: 2022-03-04 | Discharge: 2022-03-04 | Disposition: A | Discharge status: Home / Self Care | Payer: Medicare Other | Source: Ambulatory Visit | Attending: Internal Medicine | Admitting: Internal Medicine

## 2022-03-04 DIAGNOSIS — I5020 Unspecified systolic (congestive) heart failure: Secondary | ICD-10-CM | POA: Diagnosis not present

## 2022-03-04 DIAGNOSIS — R188 Other ascites: Secondary | ICD-10-CM | POA: Insufficient documentation

## 2022-03-04 DIAGNOSIS — Z992 Dependence on renal dialysis: Secondary | ICD-10-CM | POA: Diagnosis not present

## 2022-03-04 DIAGNOSIS — N186 End stage renal disease: Secondary | ICD-10-CM | POA: Diagnosis not present

## 2022-03-04 NOTE — Procedures (Signed)
PROCEDURE SUMMARY:  Successful US guided paracentesis from right lower quadrant.  Yielded 2.5 L of clear yellow fluid.  No immediate complications.  Pt tolerated well.   Specimen not sent for labs.  EBL < 2 mL  Theresa Duty, NP 03/04/2022 2:29 PM

## 2022-03-06 DIAGNOSIS — N186 End stage renal disease: Secondary | ICD-10-CM | POA: Diagnosis not present

## 2022-03-06 DIAGNOSIS — Z992 Dependence on renal dialysis: Secondary | ICD-10-CM | POA: Diagnosis not present

## 2022-03-09 DIAGNOSIS — Z992 Dependence on renal dialysis: Secondary | ICD-10-CM | POA: Diagnosis not present

## 2022-03-09 DIAGNOSIS — N186 End stage renal disease: Secondary | ICD-10-CM | POA: Diagnosis not present

## 2022-03-11 DIAGNOSIS — Z992 Dependence on renal dialysis: Secondary | ICD-10-CM | POA: Diagnosis not present

## 2022-03-11 DIAGNOSIS — N186 End stage renal disease: Secondary | ICD-10-CM | POA: Diagnosis not present

## 2022-03-13 DIAGNOSIS — N186 End stage renal disease: Secondary | ICD-10-CM | POA: Diagnosis not present

## 2022-03-13 DIAGNOSIS — Z992 Dependence on renal dialysis: Secondary | ICD-10-CM | POA: Diagnosis not present

## 2022-03-14 DIAGNOSIS — N186 End stage renal disease: Secondary | ICD-10-CM | POA: Diagnosis not present

## 2022-03-14 DIAGNOSIS — I120 Hypertensive chronic kidney disease with stage 5 chronic kidney disease or end stage renal disease: Secondary | ICD-10-CM | POA: Diagnosis not present

## 2022-03-14 DIAGNOSIS — E785 Hyperlipidemia, unspecified: Secondary | ICD-10-CM | POA: Diagnosis not present

## 2022-03-14 DIAGNOSIS — Z992 Dependence on renal dialysis: Secondary | ICD-10-CM | POA: Diagnosis not present

## 2022-03-15 DIAGNOSIS — N186 End stage renal disease: Secondary | ICD-10-CM | POA: Diagnosis not present

## 2022-03-15 DIAGNOSIS — Z23 Encounter for immunization: Secondary | ICD-10-CM | POA: Diagnosis not present

## 2022-03-15 DIAGNOSIS — Z992 Dependence on renal dialysis: Secondary | ICD-10-CM | POA: Diagnosis not present

## 2022-03-17 DIAGNOSIS — E782 Mixed hyperlipidemia: Secondary | ICD-10-CM | POA: Diagnosis not present

## 2022-03-17 DIAGNOSIS — R55 Syncope and collapse: Secondary | ICD-10-CM | POA: Diagnosis not present

## 2022-03-17 DIAGNOSIS — G4452 New daily persistent headache (NDPH): Secondary | ICD-10-CM | POA: Diagnosis not present

## 2022-03-17 DIAGNOSIS — I340 Nonrheumatic mitral (valve) insufficiency: Secondary | ICD-10-CM | POA: Diagnosis not present

## 2022-03-17 DIAGNOSIS — I10 Essential (primary) hypertension: Secondary | ICD-10-CM | POA: Diagnosis not present

## 2022-03-17 DIAGNOSIS — I2510 Atherosclerotic heart disease of native coronary artery without angina pectoris: Secondary | ICD-10-CM | POA: Diagnosis not present

## 2022-03-17 DIAGNOSIS — I509 Heart failure, unspecified: Secondary | ICD-10-CM | POA: Diagnosis not present

## 2022-03-17 DIAGNOSIS — M25551 Pain in right hip: Secondary | ICD-10-CM | POA: Diagnosis not present

## 2022-03-17 DIAGNOSIS — R42 Dizziness and giddiness: Secondary | ICD-10-CM | POA: Diagnosis not present

## 2022-03-17 DIAGNOSIS — F17200 Nicotine dependence, unspecified, uncomplicated: Secondary | ICD-10-CM | POA: Diagnosis not present

## 2022-03-17 DIAGNOSIS — I6389 Other cerebral infarction: Secondary | ICD-10-CM | POA: Diagnosis not present

## 2022-03-17 DIAGNOSIS — I25810 Atherosclerosis of coronary artery bypass graft(s) without angina pectoris: Secondary | ICD-10-CM | POA: Diagnosis not present

## 2022-03-18 DIAGNOSIS — Z992 Dependence on renal dialysis: Secondary | ICD-10-CM | POA: Diagnosis not present

## 2022-03-18 DIAGNOSIS — Z23 Encounter for immunization: Secondary | ICD-10-CM | POA: Diagnosis not present

## 2022-03-18 DIAGNOSIS — N186 End stage renal disease: Secondary | ICD-10-CM | POA: Diagnosis not present

## 2022-03-20 DIAGNOSIS — N186 End stage renal disease: Secondary | ICD-10-CM | POA: Diagnosis not present

## 2022-03-20 DIAGNOSIS — Z992 Dependence on renal dialysis: Secondary | ICD-10-CM | POA: Diagnosis not present

## 2022-03-20 DIAGNOSIS — Z23 Encounter for immunization: Secondary | ICD-10-CM | POA: Diagnosis not present

## 2022-03-23 ENCOUNTER — Other Ambulatory Visit: Payer: Self-pay | Admitting: Internal Medicine

## 2022-03-23 DIAGNOSIS — Z23 Encounter for immunization: Secondary | ICD-10-CM | POA: Diagnosis not present

## 2022-03-23 DIAGNOSIS — N186 End stage renal disease: Secondary | ICD-10-CM | POA: Diagnosis not present

## 2022-03-23 DIAGNOSIS — R188 Other ascites: Secondary | ICD-10-CM

## 2022-03-23 DIAGNOSIS — Z992 Dependence on renal dialysis: Secondary | ICD-10-CM | POA: Diagnosis not present

## 2022-03-25 ENCOUNTER — Other Ambulatory Visit (INDEPENDENT_AMBULATORY_CARE_PROVIDER_SITE_OTHER): Payer: Self-pay | Admitting: Nurse Practitioner

## 2022-03-25 ENCOUNTER — Other Ambulatory Visit (INDEPENDENT_AMBULATORY_CARE_PROVIDER_SITE_OTHER): Payer: Self-pay | Admitting: Vascular Surgery

## 2022-03-25 DIAGNOSIS — Z23 Encounter for immunization: Secondary | ICD-10-CM | POA: Diagnosis not present

## 2022-03-25 DIAGNOSIS — N186 End stage renal disease: Secondary | ICD-10-CM

## 2022-03-25 DIAGNOSIS — I509 Heart failure, unspecified: Secondary | ICD-10-CM | POA: Diagnosis not present

## 2022-03-25 DIAGNOSIS — I739 Peripheral vascular disease, unspecified: Secondary | ICD-10-CM

## 2022-03-25 DIAGNOSIS — Z9862 Peripheral vascular angioplasty status: Secondary | ICD-10-CM

## 2022-03-25 DIAGNOSIS — Z992 Dependence on renal dialysis: Secondary | ICD-10-CM | POA: Diagnosis not present

## 2022-03-26 ENCOUNTER — Encounter (INDEPENDENT_AMBULATORY_CARE_PROVIDER_SITE_OTHER): Payer: Medicare Other

## 2022-03-26 ENCOUNTER — Ambulatory Visit (INDEPENDENT_AMBULATORY_CARE_PROVIDER_SITE_OTHER): Payer: Medicare Other | Admitting: Nurse Practitioner

## 2022-03-27 ENCOUNTER — Ambulatory Visit
Admission: RE | Admit: 2022-03-27 | Discharge: 2022-03-27 | Disposition: A | Discharge status: Home / Self Care | Payer: Medicare Other | Source: Ambulatory Visit | Attending: Internal Medicine | Admitting: Internal Medicine

## 2022-03-27 DIAGNOSIS — R188 Other ascites: Secondary | ICD-10-CM | POA: Insufficient documentation

## 2022-03-27 DIAGNOSIS — Z992 Dependence on renal dialysis: Secondary | ICD-10-CM | POA: Diagnosis not present

## 2022-03-27 DIAGNOSIS — N186 End stage renal disease: Secondary | ICD-10-CM | POA: Diagnosis not present

## 2022-03-27 DIAGNOSIS — Z23 Encounter for immunization: Secondary | ICD-10-CM | POA: Diagnosis not present

## 2022-03-27 NOTE — Progress Notes (Signed)
PROCEDURE SUMMARY:  Successful image-guided paracentesis from the right lower abdomen.  Yielded 2.5L liters of amber fluid.  No immediate complications.  EBL = trace. Patient tolerated well.   Specimen was not sent for labs.  Please see imaging section of Epic for full dictation.   Lura Em PA-C 03/27/2022 3:55 PM

## 2022-03-30 DIAGNOSIS — Z992 Dependence on renal dialysis: Secondary | ICD-10-CM | POA: Diagnosis not present

## 2022-03-30 DIAGNOSIS — Z23 Encounter for immunization: Secondary | ICD-10-CM | POA: Diagnosis not present

## 2022-03-30 DIAGNOSIS — N186 End stage renal disease: Secondary | ICD-10-CM | POA: Diagnosis not present

## 2022-04-01 ENCOUNTER — Ambulatory Visit (INDEPENDENT_AMBULATORY_CARE_PROVIDER_SITE_OTHER): Payer: Medicare Other

## 2022-04-01 ENCOUNTER — Ambulatory Visit (INDEPENDENT_AMBULATORY_CARE_PROVIDER_SITE_OTHER): Payer: Medicare Other | Admitting: Nurse Practitioner

## 2022-04-01 ENCOUNTER — Encounter (INDEPENDENT_AMBULATORY_CARE_PROVIDER_SITE_OTHER): Payer: Self-pay | Admitting: Nurse Practitioner

## 2022-04-01 VITALS — BP 99/67 | HR 85 | Resp 16

## 2022-04-01 DIAGNOSIS — I10 Essential (primary) hypertension: Secondary | ICD-10-CM

## 2022-04-01 DIAGNOSIS — I739 Peripheral vascular disease, unspecified: Secondary | ICD-10-CM

## 2022-04-01 DIAGNOSIS — Z23 Encounter for immunization: Secondary | ICD-10-CM | POA: Diagnosis not present

## 2022-04-01 DIAGNOSIS — Z992 Dependence on renal dialysis: Secondary | ICD-10-CM | POA: Diagnosis not present

## 2022-04-01 DIAGNOSIS — Z9862 Peripheral vascular angioplasty status: Secondary | ICD-10-CM

## 2022-04-01 DIAGNOSIS — N186 End stage renal disease: Secondary | ICD-10-CM | POA: Diagnosis not present

## 2022-04-01 NOTE — Progress Notes (Signed)
Subjective:    Patient ID: [name], female    DOB: [birth year], 61 y.o.   MRN: [id number] Chief Complaint  Patient presents with   Follow-up    Ultrasound follow up    [name] is a 61 year old female who presents today for follow-up evaluation of her dialysis access as well as a new referral for numbness and tingling in her left lower extremity.  She recently had intervention on her left upper extremity jump graft however she has recently been experiencing more problems such as pain and bleeding with accessing her fistula.  Currently there are no open wounds or ulcerations.  There are no signs symptoms of steal syndrome in her upper extremity.  She also endorses having discoloration of her left foot with numbness and tingling.  Skin is notably darker in the left foot with a cooler sensation.  Today noninvasive studies show a flow volume of 1387 but there are elevated velocities at the confluence suggesting a stenosis.  Patient has ABI of 0.95 on the right and 0.94 on the left.  Right TBI 0.6 on the left and 0.57.  She has dampened toe waveforms in the left lower extremity.    Review of Systems  Neurological:  Positive for numbness.  Hematological:  Bruises/bleeds easily.  All other systems reviewed and are negative.      Objective:   Physical Exam Vitals reviewed.  HENT:     Head: Normocephalic.  Cardiovascular:     Rate and Rhythm: Normal rate.     Pulses:          Dorsalis pedis pulses are 1+ on the right side and detected w/ Doppler on the left side.       Posterior tibial pulses are detected w/ Doppler on the left side.  Pulmonary:     Effort: Pulmonary effort is normal.  Skin:    General: Skin is dry.  Neurological:     Mental Status: She is alert and oriented to person, place, and time.  Psychiatric:        Mood and Affect: Mood normal.        Behavior: Behavior normal.        Thought Content: Thought content normal.        Judgment: Judgment  normal.     BP 99/67 (BP Location: Right Arm)   Pulse 85   Resp 16   Past Medical History:  Diagnosis Date   Adult behavior problems    frontal lobe CVA   Anemia    chronic disease   Atrial flutter by electrocardiogram ([place]) 03/22/2014   Cardiomyopathy ([place])    CHF (congestive heart failure) ([place])    Chicken pox    Coronary artery disease    Delayed surgical wound healing 04/12/2014   Overview:  Left leg   GERD (gastroesophageal reflux disease)    Heart failure (HCC)    Hepatitis    history of hep c   History of bipolar disorder    History of CVA (cerebrovascular accident) 02/21/2014   Hyperlipidemia    Hypertension    Hypotension 02/24/2014   Myocardial infarction [facility])    Obesity    Paroxysmal atrial fibrillation ([place])    Peripheral vascular disease ([place])    Renal failure    Renal insufficiency    Stroke [facility]) 2011   Superficial incisional surgical site infection 03/14/2014    Social History   Socioeconomic History   Marital status: Single    Spouse name:  Not on file   Number of children: Not on file   Years of education: Not on file   Highest education level: Not on file  Occupational History   Not on file  Tobacco Use   Smoking status: Some Days    Packs/day: 0.25    Years: 20.00    Total pack years: 5.00    Types: Cigarettes   Smokeless tobacco: Never   Tobacco comments:    smokes one a day  Vaping Use   Vaping Use: Never used  Substance and Sexual Activity   Alcohol use: No   Drug use: No   Sexual activity: Not Currently    Birth control/protection: Post-menopausal  Other Topics Concern   Not on file  Social History Narrative   Not on file   Social Determinants of Health   Financial Resource Strain: Not on file  Food Insecurity: Not on file  Transportation Needs: Not on file  Physical Activity: Not on file  Stress: Not on file  Social Connections: Not on file  Intimate Partner Violence: Not on file    Past Surgical History:   Procedure Laterality Date   A/V FISTULAGRAM Left 03/30/2019   Procedure: A/V FISTULAGRAM;  Surgeon: Algernon Huxley, MD;  Location: [place] [facility];  Service: Cardiovascular;  Laterality: Left;   A/V FISTULAGRAM Left 10/06/2019   Procedure: A/V FISTULAGRAM;  Surgeon: Algernon Huxley, MD;  Location: [place] [facility];  Service: Cardiovascular;  Laterality: Left;   A/V FISTULAGRAM Left 05/19/2021   Procedure: A/V FISTULAGRAM;  Surgeon: Algernon Huxley, MD;  Location: [place] [facility];  Service: Cardiovascular;  Laterality: Left;   A/V FISTULAGRAM Left 01/29/2022   Procedure: A/V Fistulagram;  Surgeon: Algernon Huxley, MD;  Location: [place] [facility];  Service: Cardiovascular;  Laterality: Left;   COLONOSCOPY     COLONOSCOPY WITH PROPOFOL N/A 04/22/2021   Procedure: COLONOSCOPY WITH PROPOFOL;  Surgeon: Lesly Rubenstein, MD;  Location: [facility] ENDOSCOPY;  Service: Endoscopy;  Laterality: N/A;   CORONARY ANGIOPLASTY WITH STENT PLACEMENT  2013   CORONARY ARTERY BYPASS GRAFT     DIALYSIS FISTULA CREATION     ESOPHAGOGASTRODUODENOSCOPY     ESOPHAGOGASTRODUODENOSCOPY N/A 04/22/2021   Procedure: ESOPHAGOGASTRODUODENOSCOPY (EGD);  Surgeon: Lesly Rubenstein, MD;  Location: [facility] ENDOSCOPY;  Service: Endoscopy;  Laterality: N/A;   FLEXIBLE SIGMOIDOSCOPY N/A 02/23/2019   Procedure: FLEXIBLE SIGMOIDOSCOPY;  Surgeon: Lollie Sails, MD;  Location: [facility] ENDOSCOPY;  Service: Endoscopy;  Laterality: N/A;   LEFT HEART CATH AND CORS/GRAFTS ANGIOGRAPHY N/A 08/08/2018   Procedure: LEFT HEART CATH AND CORS/GRAFTS ANGIOGRAPHY;  Surgeon: Dionisio David, MD;  Location: [place] [facility];  Service: Cardiovascular;  Laterality: N/A;   LEFT HEART CATH AND CORS/GRAFTS ANGIOGRAPHY N/A 01/30/2019   Procedure: LEFT HEART CATH AND CORS/GRAFTS ANGIOGRAPHY;  Surgeon: Dionisio David, MD;  Location: [place] [facility];  Service: Cardiovascular;  Laterality: N/A;   percutaneous insertion intra aortic balloon right  cath     ultrasound guided pericardiocentesis      Family History  Problem Relation Age of Onset   Hypertension Mother    Ovarian cancer Mother    Diabetes type II Mother    Hypertension Father    Kidney disease Father    Hypertension Sister    Diabetes type II Maternal Grandmother    Breast cancer Maternal Grandmother    Breast cancer Maternal Aunt    Hypertension Other    Cancer Other  Renal Disease Other     Allergies  Allergen Reactions   Morphine And Related Hives   Levaquin [Levofloxacin] Itching    Severe itching; prickly sensation   Other    Latex Rash   Tape Rash       Latest Ref Rng & Units 02/16/2022   11:55 PM 02/16/2022    6:42 PM 01/30/2022    9:56 AM  CBC  WBC 4.0 - 10.5 K/uL 6.8  6.5  3.8   Hemoglobin 12.0 - 15.0 g/dL 11.0  12.6  10.1   Hematocrit 36.0 - 46.0 % 34.5  39.9  32.8   Platelets 150 - 400 K/uL 140  146  127       CMP     Component Value Date/Time   NA 138 02/16/2022 2355   NA 139 07/09/2014 0503   K 5.2 (H) 02/16/2022 2355   K 4.7 07/11/2014 0958   CL 102 02/16/2022 2355   CL 98 07/09/2014 0503   CO2 22 02/16/2022 2355   CO2 30 07/09/2014 0503   GLUCOSE 70 02/16/2022 2355   GLUCOSE 107 (H) 07/09/2014 0503   BUN 43 (H) 02/16/2022 2355   BUN 39 (H) 07/09/2014 0503   CREATININE 8.45 (H) 02/16/2022 2355   CREATININE 5.53 (H) 07/09/2014 0503   CALCIUM 8.1 (L) 02/16/2022 2355   CALCIUM 8.3 (L) 07/09/2014 0503   PROT 6.7 02/16/2022 2215   PROT 8.4 (H) 07/07/2014 2207   ALBUMIN 3.5 02/16/2022 2355   ALBUMIN 3.1 (L) 07/07/2014 2207   AST 21 02/16/2022 2215   AST 76 (H) 07/07/2014 2207   ALT 10 02/16/2022 2215   ALT 41 07/07/2014 2207   ALKPHOS 63 02/16/2022 2215   ALKPHOS 107 07/07/2014 2207   BILITOT 1.0 02/16/2022 2215   BILITOT 0.7 07/07/2014 2207   GFRNONAA 5 (L) 02/16/2022 2355   GFRNONAA 9 (L) 07/09/2014 0503   GFRNONAA 9 (L) 02/11/2014 0300   GFRAA 6 (L) 02/06/2020 0708   GFRAA 10 (L) 07/09/2014 0503   GFRAA 10 (L)  02/11/2014 0300     VAS [place] ABI WITH/WO TBI  Result Date: 04/06/2022  LOWER EXTREMITY DOPPLER STUDY Patient Name:  [name]  Date of Exam:   04/01/2022 Medical Rec #: [id number]          Accession #:    [id number] Date of Birth: 10/18/1960           Patient Gender: F Patient Age:   63 years Exam Location:  [facility] Procedure:      VAS [place] ABI WITH/WO TBI Referring Phys: Eulogio Ditch --------------------------------------------------------------------------------  Indications: Peripheral artery disease. High Risk Factors: Hyperlipidemia, coronary artery disease. Other Factors: Discoloration left foot with numbness and tingling. ESRD.  Performing Technologist: Delorise Shiner RVT  Examination Guidelines: A complete evaluation includes at minimum, Doppler waveform signals and systolic blood pressure reading at the level of bilateral brachial, anterior tibial, and posterior tibial arteries, when vessel segments are accessible. Bilateral testing is considered an integral part of a complete examination. Photoelectric Plethysmograph (PPG) waveforms and toe systolic pressure readings are included as required and additional duplex testing as needed. Limited examinations for reoccurring indications may be performed as noted.  ABI Findings: +---------+------------------+-----+---------+--------+ Right    Rt Pressure (mmHg)IndexWaveform Comment  +---------+------------------+-----+---------+--------+ Brachial 97                                       +---------+------------------+-----+---------+--------+  ATA      93                0.96 triphasic         +---------+------------------+-----+---------+--------+ PTA      92                0.95 triphasic         +---------+------------------+-----+---------+--------+ Great Toe83                0.86                   +---------+------------------+-----+---------+--------+ +---------+------------------+-----+---------+-------+  Left     Lt Pressure (mmHg)IndexWaveform Comment +---------+------------------+-----+---------+-------+ ATA      86                0.89 triphasic        +---------+------------------+-----+---------+-------+ PTA      91                0.94 triphasic        +---------+------------------+-----+---------+-------+ Great Toe55                0.57                  +---------+------------------+-----+---------+-------+ +-------+-----------+-----------+------------+------------+ ABI/TBIToday's ABIToday's TBIPrevious ABIPrevious TBI +-------+-----------+-----------+------------+------------+ Right  0.95       0.86                                +-------+-----------+-----------+------------+------------+ Left   0.94       0.57                                +-------+-----------+-----------+------------+------------+  Summary: Right: Resting right ankle-brachial index is within normal range. The right toe-brachial index is normal. Left: Resting left ankle-brachial index indicates mild left lower extremity arterial disease. The left toe-brachial index is abnormal. *See table(s) above for measurements and observations.  Electronically signed by Hortencia Pilar MD on 04/06/2022 at 1:17:41 PM.    Final        Assessment & Plan:   1. ESRD (end stage renal disease) ([place]) Recommend:  The patient is experiencing increasing problems with their dialysis access.  Patient should have a fistulagram with the intention for intervention.  The intention for intervention is to restore appropriate flow and prevent thrombosis and possible loss of the access.  As well as improve the quality of dialysis therapy.  The risks, benefits and alternative therapies were reviewed in detail with the patient.  All questions were answered.  The patient agrees to proceed with angio/intervention.    The patient will follow up with me in the office after the procedure.    2. PAD (peripheral artery  disease) (HCC) Recommend:  The patient has evidence of severe atherosclerotic changes of both lower extremities with rest pain that is associated with preulcerative changes and impending tissue loss of the left foot.  This represents a limb threatening ischemia and places the patient at the risk for left limb loss.  Patient should undergo angiography of the left lower extremity with the hope for intervention for limb salvage.  The risks and benefits as well as the alternative therapies was discussed in detail with the patient.  All questions were answered.  Patient agrees to proceed with left lower extremity angiography.  The patient will follow up with me in the office  after the procedure.       3. Primary hypertension Continue antihypertensive medications as already ordered, these medications have been reviewed and there are no changes at this time.    Current Outpatient Medications on File Prior to Visit  Medication Sig Dispense Refill   albuterol (VENTOLIN HFA) 108 (90 Base) MCG/ACT inhaler Inhale into the lungs.     amiodarone (PACERONE) 200 MG tablet Take 200 mg by mouth daily.     apixaban (ELIQUIS) 5 MG TABS tablet Take 5 mg by mouth 2 (two) times daily.     cinacalcet (SENSIPAR) 90 MG tablet Take 90 mg by mouth daily.     hydrOXYzine (ATARAX/VISTARIL) 50 MG tablet Take 50 mg by mouth at bedtime.     lactulose (CHRONULAC) 10 GM/15ML solution SMARTSIG:Milliliter(s) By Mouth     levothyroxine (SYNTHROID) 75 MCG tablet Take 75 mcg by mouth daily before breakfast.      lidocaine-prilocaine (EMLA) cream Apply 1 application topically daily as needed (port access).     midodrine (PROAMATINE) 10 MG tablet Take 1 tablet (10 mg total) by mouth 3 (three) times a week. Monday, Wednesday, Friday with hemodialysis     nitroGLYCERIN (NITROSTAT) 0.4 MG SL tablet Place 1 tablet (0.4 mg total) under the tongue every 5 (five) minutes as needed for chest pain. 30 tablet 0   oxyCODONE-acetaminophen  (PERCOCET/ROXICET) 5-325 MG tablet Take 1 tablet by mouth 2 (two) times daily as needed for moderate pain.     pantoprazole (PROTONIX) 40 MG tablet Take 40 mg by mouth daily.     rosuvastatin (CRESTOR) 20 MG tablet Take 20 mg by mouth at bedtime.     sevelamer carbonate (RENVELA) 800 MG tablet Take 3,200 mg by mouth 3 (three) times daily with meals.     carvedilol (COREG) 6.25 MG tablet Take 0.5 tablets (3.125 mg total) by mouth 2 (two) times daily. (Patient not taking: Reported on 04/01/2022)     dapagliflozin propanediol (FARXIGA) 10 MG TABS tablet Take 10 mg by mouth daily. (Patient not taking: Reported on 01/29/2022)     losartan (COZAAR) 25 MG tablet Take 12.5 mg by mouth daily. (Patient not taking: Reported on 04/01/2022)     ranolazine (RANEXA) 1000 MG SR tablet Take 1 tablet (1,000 mg total) by mouth 2 (two) times daily. 60 tablet 2   Current Facility-Administered Medications on File Prior to Visit  Medication Dose Route Frequency Provider Last Rate Last Admin   sodium chloride flush (NS) 0.9 % injection 3 mL  3 mL Intravenous Q12H Neoma Laming A, MD   350 mL at 10/06/19 1212    There are no Patient Instructions on file for this visit. No follow-ups on file.   Kris Hartmann, NP

## 2022-04-01 NOTE — H&P (View-Only) (Signed)
Subjective:    Patient ID: [name], female    DOB: 1960-11-16, 61 y.o.   MRN: [id number] Chief Complaint  Patient presents with   Follow-up    Ultrasound follow up    [name] is a 61 year old female who presents today for follow-up evaluation of her dialysis access as well as a new referral for numbness and tingling in her left lower extremity.  She recently had intervention on her left upper extremity jump graft however she has recently been experiencing more problems such as pain and bleeding with accessing her fistula.  Currently there are no open wounds or ulcerations.  There are no signs symptoms of steal syndrome in her upper extremity.  She also endorses having discoloration of her left foot with numbness and tingling.  Skin is notably darker in the left foot with a cooler sensation.  Today noninvasive studies show a flow volume of 1387 but there are elevated velocities at the confluence suggesting a stenosis.  Patient has ABI of 0.95 on the right and 0.94 on the left.  Right TBI 0.6 on the left and 0.57.  She has dampened toe waveforms in the left lower extremity.    Review of Systems  Neurological:  Positive for numbness.  Hematological:  Bruises/bleeds easily.  All other systems reviewed and are negative.      Objective:   Physical Exam Vitals reviewed.  HENT:     Head: Normocephalic.  Cardiovascular:     Rate and Rhythm: Normal rate.     Pulses:          Dorsalis pedis pulses are 1+ on the right side and detected w/ Doppler on the left side.       Posterior tibial pulses are detected w/ Doppler on the left side.  Pulmonary:     Effort: Pulmonary effort is normal.  Skin:    General: Skin is dry.  Neurological:     Mental Status: She is alert and oriented to person, place, and time.  Psychiatric:        Mood and Affect: Mood normal.        Behavior: Behavior normal.        Thought Content: Thought content normal.        Judgment: Judgment  normal.     BP 99/67 (BP Location: Right Arm)   Pulse 85   Resp 16   Past Medical History:  Diagnosis Date   Adult behavior problems    frontal lobe CVA   Anemia    chronic disease   Atrial flutter by electrocardiogram ([place]) 03/22/2014   Cardiomyopathy ([place])    CHF (congestive heart failure) ([place])    Chicken pox    Coronary artery disease    Delayed surgical wound healing 04/12/2014   Overview:  Left leg   GERD (gastroesophageal reflux disease)    Heart failure (HCC)    Hepatitis    history of hep c   History of bipolar disorder    History of CVA (cerebrovascular accident) 02/21/2014   Hyperlipidemia    Hypertension    Hypotension 02/24/2014   Myocardial infarction [facility])    Obesity    Paroxysmal atrial fibrillation ([place])    Peripheral vascular disease ([place])    Renal failure    Renal insufficiency    Stroke [facility]) 2011   Superficial incisional surgical site infection 03/14/2014    Social History   Socioeconomic History   Marital status: Single    Spouse name:  Not on file   Number of children: Not on file   Years of education: Not on file   Highest education level: Not on file  Occupational History   Not on file  Tobacco Use   Smoking status: Some Days    Packs/day: 0.25    Years: 20.00    Total pack years: 5.00    Types: Cigarettes   Smokeless tobacco: Never   Tobacco comments:    smokes one a day  Vaping Use   Vaping Use: Never used  Substance and Sexual Activity   Alcohol use: No   Drug use: No   Sexual activity: Not Currently    Birth control/protection: Post-menopausal  Other Topics Concern   Not on file  Social History Narrative   Not on file   Social Determinants of Health   Financial Resource Strain: Not on file  Food Insecurity: Not on file  Transportation Needs: Not on file  Physical Activity: Not on file  Stress: Not on file  Social Connections: Not on file  Intimate Partner Violence: Not on file    Past Surgical History:   Procedure Laterality Date   A/V FISTULAGRAM Left 03/30/2019   Procedure: A/V FISTULAGRAM;  Surgeon: Algernon Huxley, MD;  Location: [place] [facility];  Service: Cardiovascular;  Laterality: Left;   A/V FISTULAGRAM Left 10/06/2019   Procedure: A/V FISTULAGRAM;  Surgeon: Algernon Huxley, MD;  Location: [place] [facility];  Service: Cardiovascular;  Laterality: Left;   A/V FISTULAGRAM Left 05/19/2021   Procedure: A/V FISTULAGRAM;  Surgeon: Algernon Huxley, MD;  Location: [place] [facility];  Service: Cardiovascular;  Laterality: Left;   A/V FISTULAGRAM Left 01/29/2022   Procedure: A/V Fistulagram;  Surgeon: Algernon Huxley, MD;  Location: [place] [facility];  Service: Cardiovascular;  Laterality: Left;   COLONOSCOPY     COLONOSCOPY WITH PROPOFOL N/A 04/22/2021   Procedure: COLONOSCOPY WITH PROPOFOL;  Surgeon: Lesly Rubenstein, MD;  Location: [facility] ENDOSCOPY;  Service: Endoscopy;  Laterality: N/A;   CORONARY ANGIOPLASTY WITH STENT PLACEMENT  2013   CORONARY ARTERY BYPASS GRAFT     DIALYSIS FISTULA CREATION     ESOPHAGOGASTRODUODENOSCOPY     ESOPHAGOGASTRODUODENOSCOPY N/A 04/22/2021   Procedure: ESOPHAGOGASTRODUODENOSCOPY (EGD);  Surgeon: Lesly Rubenstein, MD;  Location: [facility] ENDOSCOPY;  Service: Endoscopy;  Laterality: N/A;   FLEXIBLE SIGMOIDOSCOPY N/A 02/23/2019   Procedure: FLEXIBLE SIGMOIDOSCOPY;  Surgeon: Lollie Sails, MD;  Location: [facility] ENDOSCOPY;  Service: Endoscopy;  Laterality: N/A;   LEFT HEART CATH AND CORS/GRAFTS ANGIOGRAPHY N/A 08/08/2018   Procedure: LEFT HEART CATH AND CORS/GRAFTS ANGIOGRAPHY;  Surgeon: Dionisio David, MD;  Location: [place] [facility];  Service: Cardiovascular;  Laterality: N/A;   LEFT HEART CATH AND CORS/GRAFTS ANGIOGRAPHY N/A 01/30/2019   Procedure: LEFT HEART CATH AND CORS/GRAFTS ANGIOGRAPHY;  Surgeon: Dionisio David, MD;  Location: [place] [facility];  Service: Cardiovascular;  Laterality: N/A;   percutaneous insertion intra aortic balloon right  cath     ultrasound guided pericardiocentesis      Family History  Problem Relation Age of Onset   Hypertension Mother    Ovarian cancer Mother    Diabetes type II Mother    Hypertension Father    Kidney disease Father    Hypertension Sister    Diabetes type II Maternal Grandmother    Breast cancer Maternal Grandmother    Breast cancer Maternal Aunt    Hypertension Other    Cancer Other  Renal Disease Other     Allergies  Allergen Reactions   Morphine And Related Hives   Levaquin [Levofloxacin] Itching    Severe itching; prickly sensation   Other    Latex Rash   Tape Rash       Latest Ref Rng & Units 02/16/2022   11:55 PM 02/16/2022    6:42 PM 01/30/2022    9:56 AM  CBC  WBC 4.0 - 10.5 K/uL 6.8  6.5  3.8   Hemoglobin 12.0 - 15.0 g/dL 11.0  12.6  10.1   Hematocrit 36.0 - 46.0 % 34.5  39.9  32.8   Platelets 150 - 400 K/uL 140  146  127       CMP     Component Value Date/Time   NA 138 02/16/2022 2355   NA 139 07/09/2014 0503   K 5.2 (H) 02/16/2022 2355   K 4.7 07/11/2014 0958   CL 102 02/16/2022 2355   CL 98 07/09/2014 0503   CO2 22 02/16/2022 2355   CO2 30 07/09/2014 0503   GLUCOSE 70 02/16/2022 2355   GLUCOSE 107 (H) 07/09/2014 0503   BUN 43 (H) 02/16/2022 2355   BUN 39 (H) 07/09/2014 0503   CREATININE 8.45 (H) 02/16/2022 2355   CREATININE 5.53 (H) 07/09/2014 0503   CALCIUM 8.1 (L) 02/16/2022 2355   CALCIUM 8.3 (L) 07/09/2014 0503   PROT 6.7 02/16/2022 2215   PROT 8.4 (H) 07/07/2014 2207   ALBUMIN 3.5 02/16/2022 2355   ALBUMIN 3.1 (L) 07/07/2014 2207   AST 21 02/16/2022 2215   AST 76 (H) 07/07/2014 2207   ALT 10 02/16/2022 2215   ALT 41 07/07/2014 2207   ALKPHOS 63 02/16/2022 2215   ALKPHOS 107 07/07/2014 2207   BILITOT 1.0 02/16/2022 2215   BILITOT 0.7 07/07/2014 2207   GFRNONAA 5 (L) 02/16/2022 2355   GFRNONAA 9 (L) 07/09/2014 0503   GFRNONAA 9 (L) 02/11/2014 0300   GFRAA 6 (L) 02/06/2020 0708   GFRAA 10 (L) 07/09/2014 0503   GFRAA 10 (L)  02/11/2014 0300     VAS [place] ABI WITH/WO TBI  Result Date: 04/06/2022  LOWER EXTREMITY DOPPLER STUDY Patient Name:  S[place] Moss Li[place]ag  Date of Exam:   04/01/2022 Medical Rec #: [id number]          Accession #:    [id number] Date of Birth: 03-06-1961           Patient Gender: F Patient Age:   89 years Exam Location:  [facility] Procedure:      VAS [place] ABI WITH/WO TBI Referring Phys: Eulogio Ditch --------------------------------------------------------------------------------  Indications: Peripheral artery disease. High Risk Factors: Hyperlipidemia, coronary artery disease. Other Factors: Discoloration left foot with numbness and tingling. ESRD.  Performing Technologist: Delorise Shiner RVT  Examination Guidelines: A complete evaluation includes at minimum, Doppler waveform signals and systolic blood pressure reading at the level of bilateral brachial, anterior tibial, and posterior tibial arteries, when vessel segments are accessible. Bilateral testing is considered an integral part of a complete examination. Photoelectric Plethysmograph (PPG) waveforms and toe systolic pressure readings are included as required and additional duplex testing as needed. Limited examinations for reoccurring indications may be performed as noted.  ABI Findings: +---------+------------------+-----+---------+--------+ Right    Rt Pressure (mmHg)IndexWaveform Comment  +---------+------------------+-----+---------+--------+ Brachial 97                                       +---------+------------------+-----+---------+--------+  ATA      93                0.96 triphasic         +---------+------------------+-----+---------+--------+ PTA      92                0.95 triphasic         +---------+------------------+-----+---------+--------+ Great Toe83                0.86                   +---------+------------------+-----+---------+--------+ +---------+------------------+-----+---------+-------+  Left     Lt Pressure (mmHg)IndexWaveform Comment +---------+------------------+-----+---------+-------+ ATA      86                0.89 triphasic        +---------+------------------+-----+---------+-------+ PTA      91                0.94 triphasic        +---------+------------------+-----+---------+-------+ Great Toe55                0.57                  +---------+------------------+-----+---------+-------+ +-------+-----------+-----------+------------+------------+ ABI/TBIToday's ABIToday's TBIPrevious ABIPrevious TBI +-------+-----------+-----------+------------+------------+ Right  0.95       0.86                                +-------+-----------+-----------+------------+------------+ Left   0.94       0.57                                +-------+-----------+-----------+------------+------------+  Summary: Right: Resting right ankle-brachial index is within normal range. The right toe-brachial index is normal. Left: Resting left ankle-brachial index indicates mild left lower extremity arterial disease. The left toe-brachial index is abnormal. *See table(s) above for measurements and observations.  Electronically signed by Hortencia Pilar MD on 04/06/2022 at 1:17:41 PM.    Final        Assessment & Plan:   1. ESRD (end stage renal disease) ([place]) Recommend:  The patient is experiencing increasing problems with their dialysis access.  Patient should have a fistulagram with the intention for intervention.  The intention for intervention is to restore appropriate flow and prevent thrombosis and possible loss of the access.  As well as improve the quality of dialysis therapy.  The risks, benefits and alternative therapies were reviewed in detail with the patient.  All questions were answered.  The patient agrees to proceed with angio/intervention.    The patient will follow up with me in the office after the procedure.    2. PAD (peripheral artery  disease) (HCC) Recommend:  The patient has evidence of severe atherosclerotic changes of both lower extremities with rest pain that is associated with preulcerative changes and impending tissue loss of the left foot.  This represents a limb threatening ischemia and places the patient at the risk for left limb loss.  Patient should undergo angiography of the left lower extremity with the hope for intervention for limb salvage.  The risks and benefits as well as the alternative therapies was discussed in detail with the patient.  All questions were answered.  Patient agrees to proceed with left lower extremity angiography.  The patient will follow up with me in the office  after the procedure.       3. Primary hypertension Continue antihypertensive medications as already ordered, these medications have been reviewed and there are no changes at this time.    Current Outpatient Medications on File Prior to Visit  Medication Sig Dispense Refill   albuterol (VENTOLIN HFA) 108 (90 Base) MCG/ACT inhaler Inhale into the lungs.     amiodarone (PACERONE) 200 MG tablet Take 200 mg by mouth daily.     apixaban (ELIQUIS) 5 MG TABS tablet Take 5 mg by mouth 2 (two) times daily.     cinacalcet (SENSIPAR) 90 MG tablet Take 90 mg by mouth daily.     hydrOXYzine (ATARAX/VISTARIL) 50 MG tablet Take 50 mg by mouth at bedtime.     lactulose (CHRONULAC) 10 GM/15ML solution SMARTSIG:Milliliter(s) By Mouth     levothyroxine (SYNTHROID) 75 MCG tablet Take 75 mcg by mouth daily before breakfast.      lidocaine-prilocaine (EMLA) cream Apply 1 application topically daily as needed (port access).     midodrine (PROAMATINE) 10 MG tablet Take 1 tablet (10 mg total) by mouth 3 (three) times a week. Monday, Wednesday, Friday with hemodialysis     nitroGLYCERIN (NITROSTAT) 0.4 MG SL tablet Place 1 tablet (0.4 mg total) under the tongue every 5 (five) minutes as needed for chest pain. 30 tablet 0   oxyCODONE-acetaminophen  (PERCOCET/ROXICET) 5-325 MG tablet Take 1 tablet by mouth 2 (two) times daily as needed for moderate pain.     pantoprazole (PROTONIX) 40 MG tablet Take 40 mg by mouth daily.     rosuvastatin (CRESTOR) 20 MG tablet Take 20 mg by mouth at bedtime.     sevelamer carbonate (RENVELA) 800 MG tablet Take 3,200 mg by mouth 3 (three) times daily with meals.     carvedilol (COREG) 6.25 MG tablet Take 0.5 tablets (3.125 mg total) by mouth 2 (two) times daily. (Patient not taking: Reported on 04/01/2022)     dapagliflozin propanediol (FARXIGA) 10 MG TABS tablet Take 10 mg by mouth daily. (Patient not taking: Reported on 01/29/2022)     losartan (COZAAR) 25 MG tablet Take 12.5 mg by mouth daily. (Patient not taking: Reported on 04/01/2022)     ranolazine (RANEXA) 1000 MG SR tablet Take 1 tablet (1,000 mg total) by mouth 2 (two) times daily. 60 tablet 2   Current Facility-Administered Medications on File Prior to Visit  Medication Dose Route Frequency Provider Last Rate Last Admin   sodium chloride flush (NS) 0.9 % injection 3 mL  3 mL Intravenous Q12H Neoma Laming A, MD   350 mL at 10/06/19 1212    There are no Patient Instructions on file for this visit. No follow-ups on file.   Kris Hartmann, NP

## 2022-04-03 ENCOUNTER — Other Ambulatory Visit
Admission: RE | Admit: 2022-04-03 | Discharge: 2022-04-03 | Disposition: A | Discharge status: Home / Self Care | Payer: Medicare Other | Source: Ambulatory Visit | Attending: Internal Medicine | Admitting: Internal Medicine

## 2022-04-03 DIAGNOSIS — Z114 Encounter for screening for human immunodeficiency virus [HIV]: Secondary | ICD-10-CM | POA: Insufficient documentation

## 2022-04-03 DIAGNOSIS — Z0184 Encounter for antibody response examination: Secondary | ICD-10-CM | POA: Insufficient documentation

## 2022-04-03 DIAGNOSIS — Z118 Encounter for screening for other infectious and parasitic diseases: Secondary | ICD-10-CM | POA: Diagnosis not present

## 2022-04-03 DIAGNOSIS — N186 End stage renal disease: Secondary | ICD-10-CM | POA: Diagnosis not present

## 2022-04-03 DIAGNOSIS — Z23 Encounter for immunization: Secondary | ICD-10-CM | POA: Diagnosis not present

## 2022-04-03 DIAGNOSIS — Z992 Dependence on renal dialysis: Secondary | ICD-10-CM | POA: Diagnosis not present

## 2022-04-03 LAB — HEPATITIS C ANTIBODY: HCV Ab: REACTIVE — AB

## 2022-04-03 LAB — RAPID HIV SCREEN (HIV 1/2 AB+AG)
HIV 1/2 Antibodies: NONREACTIVE
HIV-1 P24 Antigen - HIV24: NONREACTIVE

## 2022-04-06 DIAGNOSIS — Z992 Dependence on renal dialysis: Secondary | ICD-10-CM | POA: Diagnosis not present

## 2022-04-06 DIAGNOSIS — Z23 Encounter for immunization: Secondary | ICD-10-CM | POA: Diagnosis not present

## 2022-04-06 DIAGNOSIS — N186 End stage renal disease: Secondary | ICD-10-CM | POA: Diagnosis not present

## 2022-04-08 DIAGNOSIS — R768 Other specified abnormal immunological findings in serum: Secondary | ICD-10-CM | POA: Diagnosis not present

## 2022-04-08 DIAGNOSIS — Z992 Dependence on renal dialysis: Secondary | ICD-10-CM | POA: Diagnosis not present

## 2022-04-08 DIAGNOSIS — Z23 Encounter for immunization: Secondary | ICD-10-CM | POA: Diagnosis not present

## 2022-04-08 DIAGNOSIS — N186 End stage renal disease: Secondary | ICD-10-CM | POA: Diagnosis not present

## 2022-04-09 DIAGNOSIS — Z1211 Encounter for screening for malignant neoplasm of colon: Secondary | ICD-10-CM | POA: Diagnosis not present

## 2022-04-09 DIAGNOSIS — I10 Essential (primary) hypertension: Secondary | ICD-10-CM | POA: Diagnosis not present

## 2022-04-09 DIAGNOSIS — N184 Chronic kidney disease, stage 4 (severe): Secondary | ICD-10-CM | POA: Diagnosis not present

## 2022-04-09 DIAGNOSIS — Z1231 Encounter for screening mammogram for malignant neoplasm of breast: Secondary | ICD-10-CM | POA: Diagnosis not present

## 2022-04-09 DIAGNOSIS — Z0000 Encounter for general adult medical examination without abnormal findings: Secondary | ICD-10-CM | POA: Diagnosis not present

## 2022-04-10 DIAGNOSIS — Z992 Dependence on renal dialysis: Secondary | ICD-10-CM | POA: Diagnosis not present

## 2022-04-10 DIAGNOSIS — N186 End stage renal disease: Secondary | ICD-10-CM | POA: Diagnosis not present

## 2022-04-10 DIAGNOSIS — Z23 Encounter for immunization: Secondary | ICD-10-CM | POA: Diagnosis not present

## 2022-04-11 ENCOUNTER — Encounter (INDEPENDENT_AMBULATORY_CARE_PROVIDER_SITE_OTHER): Payer: Self-pay | Admitting: Nurse Practitioner

## 2022-04-13 ENCOUNTER — Telehealth (INDEPENDENT_AMBULATORY_CARE_PROVIDER_SITE_OTHER): Payer: Self-pay

## 2022-04-13 DIAGNOSIS — Z992 Dependence on renal dialysis: Secondary | ICD-10-CM | POA: Diagnosis not present

## 2022-04-13 DIAGNOSIS — Z23 Encounter for immunization: Secondary | ICD-10-CM | POA: Diagnosis not present

## 2022-04-13 DIAGNOSIS — N186 End stage renal disease: Secondary | ICD-10-CM | POA: Diagnosis not present

## 2022-04-13 NOTE — Telephone Encounter (Signed)
I attempted to contact the patient and called the mobile number and a message was left for a return call. I then called the dialysis center and spoke with [facility] regarding getting the patient scheduled. Patient is scheduled with Dr. Lucky Cowboy on 04/20/22 with a 11 am arrival time to the [facility] for a left arm fistulagram. Scheduled on 04/30/22 with a 9:15 am arrival time to the MM for a LLE angio with Dr. Lucky Cowboy. Anesthesia has been scheduled through [facility]. Pre-procedure instructions will be faxed to [facility] as the patient is there now and mailed as well.

## 2022-04-14 ENCOUNTER — Other Ambulatory Visit: Payer: Self-pay | Admitting: Family

## 2022-04-14 DIAGNOSIS — N186 End stage renal disease: Secondary | ICD-10-CM | POA: Diagnosis not present

## 2022-04-14 DIAGNOSIS — I120 Hypertensive chronic kidney disease with stage 5 chronic kidney disease or end stage renal disease: Secondary | ICD-10-CM | POA: Diagnosis not present

## 2022-04-14 DIAGNOSIS — E785 Hyperlipidemia, unspecified: Secondary | ICD-10-CM | POA: Diagnosis not present

## 2022-04-14 DIAGNOSIS — Z992 Dependence on renal dialysis: Secondary | ICD-10-CM | POA: Diagnosis not present

## 2022-04-14 DIAGNOSIS — K219 Gastro-esophageal reflux disease without esophagitis: Secondary | ICD-10-CM | POA: Diagnosis not present

## 2022-04-14 DIAGNOSIS — R933 Abnormal findings on diagnostic imaging of other parts of digestive tract: Secondary | ICD-10-CM | POA: Diagnosis not present

## 2022-04-14 DIAGNOSIS — Z1231 Encounter for screening mammogram for malignant neoplasm of breast: Secondary | ICD-10-CM

## 2022-04-14 DIAGNOSIS — R1314 Dysphagia, pharyngoesophageal phase: Secondary | ICD-10-CM | POA: Diagnosis not present

## 2022-04-15 DIAGNOSIS — Z992 Dependence on renal dialysis: Secondary | ICD-10-CM | POA: Diagnosis not present

## 2022-04-15 DIAGNOSIS — N186 End stage renal disease: Secondary | ICD-10-CM | POA: Diagnosis not present

## 2022-04-17 ENCOUNTER — Encounter: Payer: Self-pay | Admitting: Anesthesiology

## 2022-04-17 DIAGNOSIS — N186 End stage renal disease: Secondary | ICD-10-CM | POA: Diagnosis not present

## 2022-04-17 DIAGNOSIS — Z992 Dependence on renal dialysis: Secondary | ICD-10-CM | POA: Diagnosis not present

## 2022-04-19 DIAGNOSIS — Z992 Dependence on renal dialysis: Secondary | ICD-10-CM | POA: Diagnosis not present

## 2022-04-19 DIAGNOSIS — N186 End stage renal disease: Secondary | ICD-10-CM | POA: Diagnosis not present

## 2022-04-20 ENCOUNTER — Other Ambulatory Visit: Payer: Self-pay | Admitting: Internal Medicine

## 2022-04-20 ENCOUNTER — Encounter
Admission: RE | Disposition: A | Discharge status: Home / Self Care | Payer: Self-pay | Source: Home / Self Care | Attending: Vascular Surgery

## 2022-04-20 ENCOUNTER — Ambulatory Visit
Admission: RE | Admit: 2022-04-20 | Discharge: 2022-04-20 | Disposition: A | Discharge status: Home / Self Care | Payer: Medicare Other | Source: Ambulatory Visit | Attending: Internal Medicine | Admitting: Internal Medicine

## 2022-04-20 ENCOUNTER — Ambulatory Visit
Admission: RE | Admit: 2022-04-20 | Discharge: 2022-04-20 | Disposition: A | Discharge status: Home / Self Care | Payer: Medicare Other | Attending: Vascular Surgery | Admitting: Vascular Surgery

## 2022-04-20 DIAGNOSIS — I5020 Unspecified systolic (congestive) heart failure: Secondary | ICD-10-CM | POA: Diagnosis not present

## 2022-04-20 DIAGNOSIS — N186 End stage renal disease: Secondary | ICD-10-CM | POA: Diagnosis not present

## 2022-04-20 DIAGNOSIS — Z992 Dependence on renal dialysis: Secondary | ICD-10-CM | POA: Diagnosis not present

## 2022-04-20 DIAGNOSIS — R188 Other ascites: Secondary | ICD-10-CM | POA: Diagnosis not present

## 2022-04-20 SURGERY — A/V FISTULAGRAM
Anesthesia: General | Laterality: Left

## 2022-04-20 MED ORDER — LIDOCAINE HCL (PF) 1 % IJ SOLN
10.0000 mL | Freq: Once | INTRAMUSCULAR | Status: AC
  Administered 2022-04-20: 10 mL via INTRADERMAL

## 2022-04-20 MED ORDER — MIDAZOLAM HCL 2 MG/ML PO SYRP: 8.0000 mg | ORAL_SOLUTION | Freq: Once | ORAL | Status: DC | PRN

## 2022-04-20 MED ORDER — FAMOTIDINE 20 MG PO TABS: 40.0000 mg | ORAL_TABLET | Freq: Once | ORAL | Status: DC | PRN

## 2022-04-20 MED ORDER — CEFAZOLIN SODIUM-DEXTROSE 2-4 GM/100ML-% IV SOLN: 2.0000 g | INTRAVENOUS | Status: DC

## 2022-04-20 MED ORDER — PROPOFOL 1000 MG/100ML IV EMUL
INTRAVENOUS | Status: AC
  Filled 2022-04-20: qty 100

## 2022-04-20 MED ORDER — SODIUM CHLORIDE 0.9 % IV SOLN: INTRAVENOUS | Status: DC

## 2022-04-20 MED ORDER — DIPHENHYDRAMINE HCL 50 MG/ML IJ SOLN: 50.0000 mg | Freq: Once | INTRAMUSCULAR | Status: DC | PRN

## 2022-04-20 MED ORDER — METHYLPREDNISOLONE SODIUM SUCC 125 MG IJ SOLR: 125.0000 mg | Freq: Once | INTRAMUSCULAR | Status: DC | PRN

## 2022-04-20 NOTE — Procedures (Signed)
PROCEDURE SUMMARY:  Successful US guided therapeutic paracentesis from RLQ.  Yielded 2.7 L of hazy, amber fluid fluid.  No immediate complications.  Pt tolerated well.   Specimen not sent for labs.  EBL < 1 mL  Tyson Alias, AGNP 04/20/2022 1:58 PM

## 2022-04-22 DIAGNOSIS — Z992 Dependence on renal dialysis: Secondary | ICD-10-CM | POA: Diagnosis not present

## 2022-04-22 DIAGNOSIS — N186 End stage renal disease: Secondary | ICD-10-CM | POA: Diagnosis not present

## 2022-04-23 DIAGNOSIS — E782 Mixed hyperlipidemia: Secondary | ICD-10-CM | POA: Diagnosis not present

## 2022-04-23 DIAGNOSIS — F17210 Nicotine dependence, cigarettes, uncomplicated: Secondary | ICD-10-CM | POA: Diagnosis not present

## 2022-04-23 DIAGNOSIS — R0602 Shortness of breath: Secondary | ICD-10-CM | POA: Diagnosis not present

## 2022-04-23 DIAGNOSIS — I2510 Atherosclerotic heart disease of native coronary artery without angina pectoris: Secondary | ICD-10-CM | POA: Diagnosis not present

## 2022-04-23 DIAGNOSIS — I219 Acute myocardial infarction, unspecified: Secondary | ICD-10-CM | POA: Diagnosis not present

## 2022-04-23 DIAGNOSIS — I25810 Atherosclerosis of coronary artery bypass graft(s) without angina pectoris: Secondary | ICD-10-CM | POA: Diagnosis not present

## 2022-04-23 DIAGNOSIS — I340 Nonrheumatic mitral (valve) insufficiency: Secondary | ICD-10-CM | POA: Diagnosis not present

## 2022-04-23 DIAGNOSIS — I10 Essential (primary) hypertension: Secondary | ICD-10-CM | POA: Diagnosis not present

## 2022-04-23 DIAGNOSIS — I509 Heart failure, unspecified: Secondary | ICD-10-CM | POA: Diagnosis not present

## 2022-04-24 DIAGNOSIS — Z992 Dependence on renal dialysis: Secondary | ICD-10-CM | POA: Diagnosis not present

## 2022-04-24 DIAGNOSIS — N186 End stage renal disease: Secondary | ICD-10-CM | POA: Diagnosis not present

## 2022-04-25 DIAGNOSIS — I509 Heart failure, unspecified: Secondary | ICD-10-CM | POA: Diagnosis not present

## 2022-04-27 DIAGNOSIS — N186 End stage renal disease: Secondary | ICD-10-CM | POA: Diagnosis not present

## 2022-04-27 DIAGNOSIS — Z992 Dependence on renal dialysis: Secondary | ICD-10-CM | POA: Diagnosis not present

## 2022-04-28 DIAGNOSIS — Z8673 Personal history of transient ischemic attack (TIA), and cerebral infarction without residual deficits: Secondary | ICD-10-CM | POA: Diagnosis not present

## 2022-04-28 DIAGNOSIS — R296 Repeated falls: Secondary | ICD-10-CM | POA: Diagnosis not present

## 2022-04-28 DIAGNOSIS — Z7689 Persons encountering health services in other specified circumstances: Secondary | ICD-10-CM | POA: Diagnosis not present

## 2022-04-28 DIAGNOSIS — R42 Dizziness and giddiness: Secondary | ICD-10-CM | POA: Diagnosis not present

## 2022-04-28 DIAGNOSIS — R519 Headache, unspecified: Secondary | ICD-10-CM | POA: Diagnosis not present

## 2022-04-29 DIAGNOSIS — N186 End stage renal disease: Secondary | ICD-10-CM | POA: Diagnosis not present

## 2022-04-29 DIAGNOSIS — Z992 Dependence on renal dialysis: Secondary | ICD-10-CM | POA: Diagnosis not present

## 2022-04-30 ENCOUNTER — Ambulatory Visit: Payer: Medicare Other | Admitting: Certified Registered Nurse Anesthetist

## 2022-04-30 ENCOUNTER — Other Ambulatory Visit: Payer: Self-pay

## 2022-04-30 ENCOUNTER — Ambulatory Visit
Admission: RE | Admit: 2022-04-30 | Discharge: 2022-04-30 | Disposition: A | Discharge status: Home / Self Care | Payer: Medicare Other | Attending: Vascular Surgery | Admitting: Vascular Surgery

## 2022-04-30 ENCOUNTER — Encounter: Payer: Self-pay | Admitting: Vascular Surgery

## 2022-04-30 ENCOUNTER — Encounter
Admission: RE | Disposition: A | Discharge status: Home / Self Care | Payer: Self-pay | Source: Home / Self Care | Attending: Vascular Surgery

## 2022-04-30 DIAGNOSIS — O252 Malnutrition in childbirth: Secondary | ICD-10-CM | POA: Insufficient documentation

## 2022-04-30 DIAGNOSIS — I70219 Atherosclerosis of native arteries of extremities with intermittent claudication, unspecified extremity: Secondary | ICD-10-CM | POA: Diagnosis not present

## 2022-04-30 DIAGNOSIS — Y841 Kidney dialysis as the cause of abnormal reaction of the patient, or of later complication, without mention of misadventure at the time of the procedure: Secondary | ICD-10-CM | POA: Insufficient documentation

## 2022-04-30 DIAGNOSIS — I132 Hypertensive heart and chronic kidney disease with heart failure and with stage 5 chronic kidney disease, or end stage renal disease: Secondary | ICD-10-CM | POA: Insufficient documentation

## 2022-04-30 DIAGNOSIS — I509 Heart failure, unspecified: Secondary | ICD-10-CM | POA: Insufficient documentation

## 2022-04-30 DIAGNOSIS — I70212 Atherosclerosis of native arteries of extremities with intermittent claudication, left leg: Secondary | ICD-10-CM | POA: Diagnosis not present

## 2022-04-30 DIAGNOSIS — T82858A Stenosis of vascular prosthetic devices, implants and grafts, initial encounter: Secondary | ICD-10-CM

## 2022-04-30 DIAGNOSIS — Z992 Dependence on renal dialysis: Secondary | ICD-10-CM | POA: Diagnosis not present

## 2022-04-30 DIAGNOSIS — I2510 Atherosclerotic heart disease of native coronary artery without angina pectoris: Secondary | ICD-10-CM | POA: Diagnosis not present

## 2022-04-30 DIAGNOSIS — F17210 Nicotine dependence, cigarettes, uncomplicated: Secondary | ICD-10-CM | POA: Diagnosis not present

## 2022-04-30 DIAGNOSIS — N186 End stage renal disease: Secondary | ICD-10-CM | POA: Diagnosis not present

## 2022-04-30 HISTORY — PX: A/V FISTULAGRAM: CATH118298

## 2022-04-30 LAB — POTASSIUM (ARMC VASCULAR LAB ONLY): Potassium (ARMC vascular lab): 4.8 mmol/L (ref 3.5–5.1)

## 2022-04-30 SURGERY — A/V FISTULAGRAM
Anesthesia: General | Laterality: Left

## 2022-04-30 MED ORDER — CEFAZOLIN SODIUM-DEXTROSE 1-4 GM/50ML-% IV SOLN
INTRAVENOUS | Status: AC
  Filled 2022-04-30: qty 50

## 2022-04-30 MED ORDER — DIPHENHYDRAMINE HCL 50 MG/ML IJ SOLN: 50.0000 mg | Freq: Once | INTRAMUSCULAR | Status: DC | PRN

## 2022-04-30 MED ORDER — FENTANYL CITRATE (PF) 100 MCG/2ML IJ SOLN
INTRAMUSCULAR | Status: AC
  Filled 2022-04-30: qty 2

## 2022-04-30 MED ORDER — ONDANSETRON HCL 4 MG/2ML IJ SOLN: 4.0000 mg | Freq: Once | INTRAMUSCULAR | Status: DC | PRN

## 2022-04-30 MED ORDER — PROPOFOL 10 MG/ML IV BOLUS
INTRAVENOUS | Status: DC | PRN
  Administered 2022-04-30: 30 mg via INTRAVENOUS
  Administered 2022-04-30 (×2): 40 mg via INTRAVENOUS
  Administered 2022-04-30: 30 mg via INTRAVENOUS

## 2022-04-30 MED ORDER — FENTANYL CITRATE (PF) 100 MCG/2ML IJ SOLN
INTRAMUSCULAR | Status: DC | PRN
  Administered 2022-04-30: 25 ug via INTRAVENOUS

## 2022-04-30 MED ORDER — LIDOCAINE HCL (CARDIAC) PF 100 MG/5ML IV SOSY
PREFILLED_SYRINGE | INTRAVENOUS | Status: DC | PRN
  Administered 2022-04-30: 20 mg via INTRAVENOUS

## 2022-04-30 MED ORDER — FAMOTIDINE 20 MG PO TABS: 40.0000 mg | ORAL_TABLET | Freq: Once | ORAL | Status: DC | PRN

## 2022-04-30 MED ORDER — METHYLPREDNISOLONE SODIUM SUCC 125 MG IJ SOLR: 125.0000 mg | Freq: Once | INTRAMUSCULAR | Status: DC | PRN

## 2022-04-30 MED ORDER — MIDAZOLAM HCL 2 MG/2ML IJ SOLN
INTRAMUSCULAR | Status: DC | PRN
  Administered 2022-04-30: 2 mg via INTRAVENOUS

## 2022-04-30 MED ORDER — EPHEDRINE SULFATE (PRESSORS) 50 MG/ML IJ SOLN
INTRAMUSCULAR | Status: DC | PRN
  Administered 2022-04-30: 5 mg via INTRAVENOUS

## 2022-04-30 MED ORDER — MIDAZOLAM HCL 2 MG/2ML IJ SOLN
INTRAMUSCULAR | Status: AC
  Filled 2022-04-30: qty 2

## 2022-04-30 MED ORDER — ONDANSETRON HCL 4 MG/2ML IJ SOLN: 4.0000 mg | Freq: Four times a day (QID) | INTRAMUSCULAR | Status: DC | PRN

## 2022-04-30 MED ORDER — HYDROMORPHONE HCL 1 MG/ML IJ SOLN: 1.0000 mg | Freq: Once | INTRAMUSCULAR | Status: DC | PRN

## 2022-04-30 MED ORDER — HEPARIN SODIUM (PORCINE) 1000 UNIT/ML IJ SOLN
INTRAMUSCULAR | Status: DC | PRN
  Administered 2022-04-30: 3000 [IU] via INTRAVENOUS

## 2022-04-30 MED ORDER — FENTANYL CITRATE (PF) 100 MCG/2ML IJ SOLN: 25.0000 ug | INTRAMUSCULAR | Status: DC | PRN

## 2022-04-30 MED ORDER — PROPOFOL 1000 MG/100ML IV EMUL
INTRAVENOUS | Status: AC
  Filled 2022-04-30: qty 100

## 2022-04-30 MED ORDER — MIDAZOLAM HCL 2 MG/ML PO SYRP: 8.0000 mg | ORAL_SOLUTION | Freq: Once | ORAL | Status: DC | PRN

## 2022-04-30 MED ORDER — SODIUM CHLORIDE 0.9 % IV SOLN: INTRAVENOUS | Status: DC

## 2022-04-30 MED ORDER — CEFAZOLIN SODIUM-DEXTROSE 1-4 GM/50ML-% IV SOLN
1.0000 g | INTRAVENOUS | Status: AC
  Administered 2022-04-30: 1 g via INTRAVENOUS

## 2022-04-30 SURGICAL SUPPLY — 10 items
BALLN LUTONIX AV 9X60X75 (BALLOONS) ×2
BALLOON LUTONIX AV 9X60X75 (BALLOONS) ×1 IMPLANT
CANNULA 5F STIFF (CANNULA) ×1 IMPLANT
COVER PROBE ULTRASOUND 5X96 (MISCELLANEOUS) ×1 IMPLANT
DRAPE BRACHIAL (DRAPES) ×1 IMPLANT
GLIDEWIRE ADV .035X180CM (WIRE) ×2 IMPLANT
KIT ENCORE 26 ADVANTAGE (KITS) ×1 IMPLANT
PACK ANGIOGRAPHY (CUSTOM PROCEDURE TRAY) ×2 IMPLANT
SHEATH BRITE TIP 6FRX5.5 (SHEATH) ×1 IMPLANT
SUT MNCRL AB 4-0 PS2 18 (SUTURE) ×1 IMPLANT

## 2022-04-30 NOTE — Anesthesia Postprocedure Evaluation (Signed)
Anesthesia Post Note  Patient: [name]  Procedure(s) Performed: A/V Fistulagram (Left)  Patient location during evaluation: PACU Anesthesia Type: General Level of consciousness: awake and alert Pain management: satisfactory to patient Vital Signs Assessment: post-procedure vital signs reviewed and stable Respiratory status: spontaneous breathing and nonlabored ventilation Cardiovascular status: stable Anesthetic complications: no  No notable events documented.   Last Vitals:  Vitals:   04/30/22 1006 04/30/22 1120  BP: (!) 113/95 96/71  Pulse: (!) 103 65  Resp: 13 13  Temp: 36.7 C   SpO2: (!) 32% 100%    Last Pain:  Vitals:   04/30/22 1120  TempSrc:   PainSc: 0-No pain                 [name],[name]

## 2022-04-30 NOTE — Op Note (Signed)
[facility]    OPERATIVE NOTE   PROCEDURE: 1.   Left brachiobasilic arteriovenous fistula cannulation under ultrasound guidance 2.   Left arm fistulagram including central venogram 3.   Percutaneous transluminal angioplasty of 2 areas of stenosis with a 9 mm diameter by 6 cm length Lutonix drug-coated angioplasty balloon, the first was in the mid access portions of the fistula and the second was in the axillary vein just proximal to the junction of the basilic vein  PRE-OPERATIVE DIAGNOSIS: 1. ESRD 2. Poorly functional left brachiobasilic AVF  POST-OPERATIVE DIAGNOSIS: same as above   SURGEON: Leotis Pain, MD  ANESTHESIA: MAC  ESTIMATED BLOOD LOSS: 5 cc  FINDING(S): 80% stenosis in the midportion of the AV fistula and areas of access sites, 65% stenosis of the axillary vein just proximal to the junction of the basilic vein.  Central venous circulation was widely patent.  SPECIMEN(S):  None  CONTRAST: 15 cc  FLUORO TIME: 1.6 minutes   INDICATIONS: [name] is a 61 y.o. female who presents with malfunctioning left brachiobasilic arteriovenous fistula.  The patient is scheduled for left arm fistulagram.  The patient is aware the risks include but are not limited to: bleeding, infection, thrombosis of the cannulated access, and possible anaphylactic reaction to the contrast.  The patient is aware of the risks of the procedure and elects to proceed forward.  DESCRIPTION: After full informed written consent was obtained, the patient was brought back to the angiography suite and placed supine upon the angiography table.  The patient was connected to monitoring equipment.  Anesthesia provided sedation for the procedure.  The left arm was prepped and draped in the standard fashion for a percutaneous access intervention.  Under ultrasound guidance, the left brachiobasilic arteriovenous fistula was cannulated with a micropuncture needle under direct ultrasound  guidance where it was patent and a permanent image was performed.  The microwire was advanced into the fistula and the needle was exchanged for the a microsheath.  I then upsized to a 6 Fr Sheath and imaging was performed.  Hand injections were completed to image the access including the central venous system. This demonstrated 80% stenosis in the midportion of the AV fistula and areas of access sites, 65% stenosis of the axillary vein just proximal to the junction of the basilic vein.  Central venous circulation was widely patent.  Based on the images, this patient will need intervention to both of these areas. I then gave the patient 3000 units of intravenous heparin.  I then crossed the stenosis with an advantage wire.  Based on the imaging, a 9 mm x 6 cm Lutonix drug-coated angioplasty balloon was selected.  The balloon was centered around the 80% access site stenosis and inflated to 10 ATM for 1 minute(s).  On completion imaging, a 10% residual stenosis was present.  The balloon was then advanced to the axillary vein and inflated to 12 atm for 1 minute.  On completion imaging about a 20% residual stenosis was present.   Based on the completion imaging, no further intervention is necessary.  The wire and balloon were removed from the sheath.  A 4-0 Monocryl purse-string suture was sewn around the sheath.  The sheath was removed while tying down the suture.  A sterile bandage was applied to the puncture site.  COMPLICATIONS: None  CONDITION: Stable   Leotis Pain  04/30/2022 11:12 AM   This note was created with Dragon Medical transcription system. Any errors in dictation  are purely unintentional.

## 2022-04-30 NOTE — Transfer of Care (Signed)
Immediate Anesthesia Transfer of Care Note  Patient: [name]  Procedure(s) Performed: A/V Fistulagram (Left)  Patient Location: Cath Lab  Anesthesia Type:General  Level of Consciousness: drowsy  Airway & Oxygen Therapy: Patient Spontanous Breathing and Patient connected to face mask oxygen  Post-op Assessment: Report given to RN, Post -op Vital signs reviewed and stable, and Patient moving all extremities  Post vital signs: Reviewed and stable  Last Vitals:  Vitals Value Taken Time  BP    Temp    Pulse    Resp    SpO2      Last Pain:  Vitals:   04/30/22 1006  TempSrc: Oral  PainSc: 0-No pain         Complications: No notable events documented.

## 2022-04-30 NOTE — Interval H&P Note (Signed)
History and Physical Interval Note:  04/30/2022 10:16 AM  [name]  has presented today for surgery, with the diagnosis of LLE Angio  ANESTHESIA   ASO w claudication.  The various methods of treatment have been discussed with the patient and family. After consideration of risks, benefits and other options for treatment, the patient has consented to  Procedure(s): A/V Fistulagram (Left) as a surgical intervention.  The patient's history has been reviewed, patient examined, no change in status, stable for surgery.  I have reviewed the patient's chart and labs.  Questions were answered to the patient's satisfaction.     Leotis Pain

## 2022-04-30 NOTE — Anesthesia Preprocedure Evaluation (Signed)
Anesthesia Evaluation  Patient identified by MRN, date of birth, ID band Patient awake    Reviewed: Allergy & Precautions, NPO status , Patient's Chart, lab work & pertinent test results  Airway Mallampati: II  TM Distance: >3 FB Neck ROM: Full    Dental  (+) Teeth Intact   Pulmonary neg pulmonary ROS, shortness of breath and with exertion, Current Smoker and Patient abstained from smoking.   Pulmonary exam normal  + decreased breath sounds      Cardiovascular Exercise Tolerance: Poor hypertension, Pt. on medications + angina  + CAD, + Past MI, + Cardiac Stents, + Peripheral Vascular Disease and +CHF  negative cardio ROS Normal cardiovascular exam+ dysrhythmias Atrial Fibrillation III Rhythm:Regular     Neuro/Psych       Dementia CVA negative neurological ROS  negative psych ROS   GI/Hepatic negative GI ROS, Neg liver ROS,GERD  Medicated,,(+) Hepatitis -, C  Endo/Other  negative endocrine ROSHypothyroidism    Renal/GU ESRFRenal disease  negative genitourinary   Musculoskeletal negative musculoskeletal ROS (+)    Abdominal  (+) + obese  Peds  Hematology negative hematology ROS (+) Blood dyscrasia, anemia   Anesthesia Other Findings Past Medical History: No date: Adult behavior problems     Comment:  frontal lobe CVA No date: Anemia     Comment:  chronic disease 03/22/2014: Atrial flutter by electrocardiogram (HCC) No date: Cardiomyopathy ([place]) No date: CHF (congestive heart failure) (HCC) No date: Chicken pox No date: Coronary artery disease 04/12/2014: Delayed surgical wound healing     Comment:  Overview:  Left leg No date: GERD (gastroesophageal reflux disease) No date: Heart failure (HCC) No date: Hepatitis     Comment:  history of hep c 02/21/2014: History of CVA (cerebrovascular accident) No date: Hyperlipidemia No date: Hypertension 02/24/2014: Hypotension No date: Myocardial infarction ([place]) No  date: Obesity No date: Paroxysmal atrial fibrillation (HCC) No date: Peripheral vascular disease ([place]) No date: Renal failure No date: Renal insufficiency 2011: Stroke ([place]) 03/14/2014: Superficial incisional surgical site infection  Past Surgical History: 03/30/2019: A/V FISTULAGRAM; Left     Comment:  Procedure: A/V FISTULAGRAM;  Surgeon: Algernon Huxley, MD;               Location: [place] [facility];  Service: Cardiovascular;              Laterality: Left; 10/06/2019: A/V FISTULAGRAM; Left     Comment:  Procedure: A/V FISTULAGRAM;  Surgeon: Algernon Huxley, MD;               Location: [place] [facility];  Service: Cardiovascular;              Laterality: Left; 05/19/2021: A/V FISTULAGRAM; Left     Comment:  Procedure: A/V FISTULAGRAM;  Surgeon: Algernon Huxley, MD;               Location: [place] [facility];  Service: Cardiovascular;              Laterality: Left; 01/29/2022: A/V FISTULAGRAM; Left     Comment:  Procedure: A/V Fistulagram;  Surgeon: Algernon Huxley, MD;               Location: [place] [facility];  Service: Cardiovascular;              Laterality: Left; No date: COLONOSCOPY 04/22/2021: COLONOSCOPY WITH PROPOFOL; N/A     Comment:  Procedure: COLONOSCOPY WITH PROPOFOL;  Surgeon:  Lesly Rubenstein, MD;  Location: [facility] ENDOSCOPY;                Service: Endoscopy;  Laterality: N/A; 2013: CORONARY ANGIOPLASTY WITH STENT PLACEMENT No date: CORONARY ARTERY BYPASS GRAFT No date: DIALYSIS FISTULA CREATION No date: ESOPHAGOGASTRODUODENOSCOPY 04/22/2021: ESOPHAGOGASTRODUODENOSCOPY; N/A     Comment:  Procedure: ESOPHAGOGASTRODUODENOSCOPY (EGD);  Surgeon:               Lesly Rubenstein, MD;  Location: [facility] ENDOSCOPY;                Service: Endoscopy;  Laterality: N/A; 02/23/2019: FLEXIBLE SIGMOIDOSCOPY; N/A     Comment:  Procedure: FLEXIBLE SIGMOIDOSCOPY;  Surgeon: Lollie Sails, MD;  Location: [facility] ENDOSCOPY;  Service:                Endoscopy;  Laterality: N/A; 08/08/2018: LEFT HEART CATH AND CORS/GRAFTS ANGIOGRAPHY; N/A     Comment:  Procedure: LEFT HEART CATH AND CORS/GRAFTS ANGIOGRAPHY;               Surgeon: Dionisio David, MD;  Location: [place] [facility];  Service: Cardiovascular;  Laterality: N/A; 01/30/2019: LEFT HEART CATH AND CORS/GRAFTS ANGIOGRAPHY; N/A     Comment:  Procedure: LEFT HEART CATH AND CORS/GRAFTS ANGIOGRAPHY;               Surgeon: Dionisio David, MD;  Location: [place] [facility];  Service: Cardiovascular;  Laterality: N/A; No date: percutaneous insertion intra aortic balloon right cath No date: ultrasound guided pericardiocentesis     Reproductive/Obstetrics negative OB ROS                             Anesthesia Physical Anesthesia Plan  ASA: 4  Anesthesia Plan: MAC   Post-op Pain Management:    Induction: Intravenous  PONV Risk Score and Plan:   Airway Management Planned: Natural Airway  Additional Equipment:   Intra-op Plan:   Post-operative Plan:   Informed Consent: I have reviewed the patients History and Physical, chart, labs and discussed the procedure including the risks, benefits and alternatives for the proposed anesthesia with the patient or authorized representative who has indicated his/her understanding and acceptance.       Plan Discussed with: CRNA and Surgeon  Anesthesia Plan Comments:        Anesthesia Quick Evaluation

## 2022-04-30 NOTE — Anesthesia Procedure Notes (Signed)
Procedure Name: MAC Date/Time: 04/30/2022 10:40 AM  Performed by: Esaw Grandchild, CRNAPre-anesthesia Checklist: Patient identified, Emergency Drugs available, Suction available and Patient being monitored Patient Re-evaluated:Patient Re-evaluated prior to induction Oxygen Delivery Method: Simple face mask Preoxygenation: Pre-oxygenation with 100% oxygen Induction Type: IV induction Dental Injury: Teeth and Oropharynx as per pre-operative assessment

## 2022-05-01 ENCOUNTER — Encounter: Payer: Self-pay | Admitting: Vascular Surgery

## 2022-05-01 DIAGNOSIS — N186 End stage renal disease: Secondary | ICD-10-CM | POA: Diagnosis not present

## 2022-05-01 DIAGNOSIS — Z992 Dependence on renal dialysis: Secondary | ICD-10-CM | POA: Diagnosis not present

## 2022-05-03 DIAGNOSIS — Z992 Dependence on renal dialysis: Secondary | ICD-10-CM | POA: Diagnosis not present

## 2022-05-03 DIAGNOSIS — N186 End stage renal disease: Secondary | ICD-10-CM | POA: Diagnosis not present

## 2022-05-05 DIAGNOSIS — Z992 Dependence on renal dialysis: Secondary | ICD-10-CM | POA: Diagnosis not present

## 2022-05-05 DIAGNOSIS — N186 End stage renal disease: Secondary | ICD-10-CM | POA: Diagnosis not present

## 2022-05-08 DIAGNOSIS — N186 End stage renal disease: Secondary | ICD-10-CM | POA: Diagnosis not present

## 2022-05-08 DIAGNOSIS — Z992 Dependence on renal dialysis: Secondary | ICD-10-CM | POA: Diagnosis not present

## 2022-05-11 ENCOUNTER — Other Ambulatory Visit: Payer: Self-pay | Admitting: Internal Medicine

## 2022-05-11 DIAGNOSIS — Z992 Dependence on renal dialysis: Secondary | ICD-10-CM | POA: Diagnosis not present

## 2022-05-11 DIAGNOSIS — R188 Other ascites: Secondary | ICD-10-CM

## 2022-05-11 DIAGNOSIS — N186 End stage renal disease: Secondary | ICD-10-CM | POA: Diagnosis not present

## 2022-05-12 ENCOUNTER — Ambulatory Visit: Payer: Medicare Other

## 2022-05-13 ENCOUNTER — Ambulatory Visit
Admission: RE | Admit: 2022-05-13 | Discharge: 2022-05-13 | Disposition: A | Discharge status: Home / Self Care | Payer: Medicare Other | Source: Ambulatory Visit | Attending: Internal Medicine | Admitting: Internal Medicine

## 2022-05-13 DIAGNOSIS — R188 Other ascites: Secondary | ICD-10-CM | POA: Insufficient documentation

## 2022-05-13 DIAGNOSIS — N186 End stage renal disease: Secondary | ICD-10-CM | POA: Diagnosis not present

## 2022-05-13 DIAGNOSIS — Z992 Dependence on renal dialysis: Secondary | ICD-10-CM | POA: Diagnosis not present

## 2022-05-13 DIAGNOSIS — I509 Heart failure, unspecified: Secondary | ICD-10-CM | POA: Diagnosis not present

## 2022-05-13 MED ORDER — LIDOCAINE HCL (PF) 1 % IJ SOLN
8.0000 mL | Freq: Once | INTRAMUSCULAR | Status: AC
  Administered 2022-05-13: 8 mL via INTRADERMAL
  Filled 2022-05-13: qty 8

## 2022-05-13 NOTE — Procedures (Signed)
PROCEDURE SUMMARY:  Successful image-guided paracentesis from the right lower abdomen.  Yielded 3.1 liters of amber fluid.  No immediate complications.  EBL = trace. Patient tolerated well.   Specimen was not sent for labs.  Please see imaging section of Epic for full dictation.   Lura Em PA-C 05/13/2022 3:11 PM

## 2022-05-14 DIAGNOSIS — N186 End stage renal disease: Secondary | ICD-10-CM | POA: Diagnosis not present

## 2022-05-14 DIAGNOSIS — I120 Hypertensive chronic kidney disease with stage 5 chronic kidney disease or end stage renal disease: Secondary | ICD-10-CM | POA: Diagnosis not present

## 2022-05-14 DIAGNOSIS — Z992 Dependence on renal dialysis: Secondary | ICD-10-CM | POA: Diagnosis not present

## 2022-05-15 DIAGNOSIS — Z992 Dependence on renal dialysis: Secondary | ICD-10-CM | POA: Diagnosis not present

## 2022-05-15 DIAGNOSIS — N186 End stage renal disease: Secondary | ICD-10-CM | POA: Diagnosis not present

## 2022-05-18 DIAGNOSIS — N186 End stage renal disease: Secondary | ICD-10-CM | POA: Diagnosis not present

## 2022-05-18 DIAGNOSIS — Z992 Dependence on renal dialysis: Secondary | ICD-10-CM | POA: Diagnosis not present

## 2022-05-19 DIAGNOSIS — Z992 Dependence on renal dialysis: Secondary | ICD-10-CM | POA: Diagnosis not present

## 2022-05-19 DIAGNOSIS — N186 End stage renal disease: Secondary | ICD-10-CM | POA: Diagnosis not present

## 2022-05-20 DIAGNOSIS — Z992 Dependence on renal dialysis: Secondary | ICD-10-CM | POA: Diagnosis not present

## 2022-05-20 DIAGNOSIS — N186 End stage renal disease: Secondary | ICD-10-CM | POA: Diagnosis not present

## 2022-05-22 DIAGNOSIS — N186 End stage renal disease: Secondary | ICD-10-CM | POA: Diagnosis not present

## 2022-05-22 DIAGNOSIS — Z992 Dependence on renal dialysis: Secondary | ICD-10-CM | POA: Diagnosis not present

## 2022-05-25 ENCOUNTER — Other Ambulatory Visit: Payer: Self-pay | Admitting: Internal Medicine

## 2022-05-25 DIAGNOSIS — Z992 Dependence on renal dialysis: Secondary | ICD-10-CM | POA: Diagnosis not present

## 2022-05-25 DIAGNOSIS — N186 End stage renal disease: Secondary | ICD-10-CM | POA: Diagnosis not present

## 2022-05-25 DIAGNOSIS — R188 Other ascites: Secondary | ICD-10-CM

## 2022-05-25 DIAGNOSIS — I509 Heart failure, unspecified: Secondary | ICD-10-CM | POA: Diagnosis not present

## 2022-05-26 ENCOUNTER — Ambulatory Visit
Admission: RE | Admit: 2022-05-26 | Discharge: 2022-05-26 | Disposition: A | Discharge status: Home / Self Care | Payer: Medicare Other | Source: Ambulatory Visit | Attending: Internal Medicine | Admitting: Internal Medicine

## 2022-05-26 DIAGNOSIS — N186 End stage renal disease: Secondary | ICD-10-CM | POA: Diagnosis not present

## 2022-05-26 DIAGNOSIS — R188 Other ascites: Secondary | ICD-10-CM | POA: Insufficient documentation

## 2022-05-26 MED ORDER — LIDOCAINE HCL (PF) 1 % IJ SOLN
10.0000 mL | Freq: Once | INTRAMUSCULAR | Status: AC
  Administered 2022-05-26: 10 mL via SUBCUTANEOUS
  Filled 2022-05-26: qty 10

## 2022-05-26 NOTE — Procedures (Signed)
Ultrasound-guided therapeutic paracentesis performed yielding 1.7 liters of amber colored fluid. No immediate complications. EBL is < 2 ml.

## 2022-05-27 DIAGNOSIS — Z992 Dependence on renal dialysis: Secondary | ICD-10-CM | POA: Diagnosis not present

## 2022-05-27 DIAGNOSIS — N186 End stage renal disease: Secondary | ICD-10-CM | POA: Diagnosis not present

## 2022-05-29 ENCOUNTER — Other Ambulatory Visit: Payer: Self-pay | Admitting: Internal Medicine

## 2022-05-29 DIAGNOSIS — R188 Other ascites: Secondary | ICD-10-CM

## 2022-05-29 DIAGNOSIS — Z992 Dependence on renal dialysis: Secondary | ICD-10-CM | POA: Diagnosis not present

## 2022-05-29 DIAGNOSIS — N186 End stage renal disease: Secondary | ICD-10-CM | POA: Diagnosis not present

## 2022-06-01 DIAGNOSIS — N186 End stage renal disease: Secondary | ICD-10-CM | POA: Diagnosis not present

## 2022-06-01 DIAGNOSIS — Z992 Dependence on renal dialysis: Secondary | ICD-10-CM | POA: Diagnosis not present

## 2022-06-02 ENCOUNTER — Ambulatory Visit
Admission: RE | Admit: 2022-06-02 | Discharge: 2022-06-02 | Disposition: A | Discharge status: Home / Self Care | Payer: Medicare Other | Source: Ambulatory Visit | Attending: Internal Medicine | Admitting: Internal Medicine

## 2022-06-02 DIAGNOSIS — R188 Other ascites: Secondary | ICD-10-CM | POA: Diagnosis not present

## 2022-06-02 MED ORDER — LIDOCAINE HCL (PF) 1 % IJ SOLN
10.0000 mL | Freq: Once | INTRAMUSCULAR | Status: AC
  Administered 2022-06-02: 10 mL via INTRADERMAL

## 2022-06-03 DIAGNOSIS — Z992 Dependence on renal dialysis: Secondary | ICD-10-CM | POA: Diagnosis not present

## 2022-06-03 DIAGNOSIS — N186 End stage renal disease: Secondary | ICD-10-CM | POA: Diagnosis not present

## 2022-06-05 DIAGNOSIS — N186 End stage renal disease: Secondary | ICD-10-CM | POA: Diagnosis not present

## 2022-06-05 DIAGNOSIS — Z992 Dependence on renal dialysis: Secondary | ICD-10-CM | POA: Diagnosis not present

## 2022-06-07 DIAGNOSIS — N186 End stage renal disease: Secondary | ICD-10-CM | POA: Diagnosis not present

## 2022-06-07 DIAGNOSIS — Z992 Dependence on renal dialysis: Secondary | ICD-10-CM | POA: Diagnosis not present

## 2022-06-10 DIAGNOSIS — N186 End stage renal disease: Secondary | ICD-10-CM | POA: Diagnosis not present

## 2022-06-10 DIAGNOSIS — Z992 Dependence on renal dialysis: Secondary | ICD-10-CM | POA: Diagnosis not present

## 2022-06-12 ENCOUNTER — Other Ambulatory Visit (INDEPENDENT_AMBULATORY_CARE_PROVIDER_SITE_OTHER): Payer: Self-pay | Admitting: Vascular Surgery

## 2022-06-12 DIAGNOSIS — N186 End stage renal disease: Secondary | ICD-10-CM

## 2022-06-12 DIAGNOSIS — Z9862 Peripheral vascular angioplasty status: Secondary | ICD-10-CM

## 2022-06-12 DIAGNOSIS — I739 Peripheral vascular disease, unspecified: Secondary | ICD-10-CM

## 2022-06-12 DIAGNOSIS — Z992 Dependence on renal dialysis: Secondary | ICD-10-CM | POA: Diagnosis not present

## 2022-06-14 DIAGNOSIS — N186 End stage renal disease: Secondary | ICD-10-CM | POA: Diagnosis not present

## 2022-06-14 DIAGNOSIS — Z992 Dependence on renal dialysis: Secondary | ICD-10-CM | POA: Diagnosis not present

## 2022-06-15 DIAGNOSIS — Z992 Dependence on renal dialysis: Secondary | ICD-10-CM | POA: Diagnosis not present

## 2022-06-15 DIAGNOSIS — N186 End stage renal disease: Secondary | ICD-10-CM | POA: Diagnosis not present

## 2022-06-16 ENCOUNTER — Other Ambulatory Visit: Payer: Self-pay | Admitting: Internal Medicine

## 2022-06-16 DIAGNOSIS — R188 Other ascites: Secondary | ICD-10-CM

## 2022-06-17 DIAGNOSIS — N186 End stage renal disease: Secondary | ICD-10-CM | POA: Diagnosis not present

## 2022-06-17 DIAGNOSIS — Z992 Dependence on renal dialysis: Secondary | ICD-10-CM | POA: Diagnosis not present

## 2022-06-18 ENCOUNTER — Ambulatory Visit (INDEPENDENT_AMBULATORY_CARE_PROVIDER_SITE_OTHER): Payer: Medicare Other

## 2022-06-18 ENCOUNTER — Ambulatory Visit (INDEPENDENT_AMBULATORY_CARE_PROVIDER_SITE_OTHER): Payer: Medicare Other | Admitting: Nurse Practitioner

## 2022-06-18 ENCOUNTER — Encounter (INDEPENDENT_AMBULATORY_CARE_PROVIDER_SITE_OTHER): Payer: Self-pay | Admitting: Nurse Practitioner

## 2022-06-18 VITALS — BP 116/81 | HR 93 | Resp 18 | Wt 159.2 lb

## 2022-06-18 DIAGNOSIS — Z9862 Peripheral vascular angioplasty status: Secondary | ICD-10-CM

## 2022-06-18 DIAGNOSIS — Z992 Dependence on renal dialysis: Secondary | ICD-10-CM | POA: Diagnosis not present

## 2022-06-18 DIAGNOSIS — N186 End stage renal disease: Secondary | ICD-10-CM | POA: Diagnosis not present

## 2022-06-18 DIAGNOSIS — I739 Peripheral vascular disease, unspecified: Secondary | ICD-10-CM | POA: Diagnosis not present

## 2022-06-18 DIAGNOSIS — R202 Paresthesia of skin: Secondary | ICD-10-CM

## 2022-06-18 DIAGNOSIS — R200 Anesthesia of skin: Secondary | ICD-10-CM | POA: Diagnosis not present

## 2022-06-18 DIAGNOSIS — I10 Essential (primary) hypertension: Secondary | ICD-10-CM | POA: Diagnosis not present

## 2022-06-18 NOTE — Progress Notes (Signed)
Subjective:    Patient ID: [name], female    DOB: May 25, 1961, 62 y.o.   MRN: [id number] Chief Complaint  Patient presents with   Follow-up    Ultrasound follow up    The patient returns to the office for followup status post intervention of their dialysis access on 04/30/2022.   Following the intervention the access function has significantly improved, with better flow rates and improved KT/V. The patient has not been experiencing increased bleeding times following decannulation and the patient denies increased recirculation.  The patient notes that there has been some swelling near the edges of the access but this has resolved.  At the present time the patient denies hand pain.  No recent shortening of the patient's walking distance or new symptoms consistent with claudication.  No history of rest pain symptoms. No new ulcers or wounds of the lower extremities have occurred.  The patient denies amaurosis fugax or recent TIA symptoms. There are no recent neurological changes noted. There is no history of DVT, PE or superficial thrombophlebitis. No recent episodes of angina or shortness of breath documented.   Duplex ultrasound of the AV access shows a patent access.  The previously noted stenosis is improved compared to last study.  Flow volume today is 1387 cc/min (previous flow volume was 1524 cc/min).  We are also following up on the patient's peripheral arterial disease.  Today she has a right ABI of 1.06 and a left 1.09.  She has normal TBI's bilaterally.  She has strong triphasic tibial artery waveforms bilaterally.  The patient is concerned about some discoloration near the ankle edge of her foot area.  There is no open wounds or ulcerations on her toes.  She also endorses having some swelling but will largely concerned her symptoms and tingling in her feet.       Review of Systems  Cardiovascular:  Positive for leg swelling.  Musculoskeletal:  Positive for gait  problem.  All other systems reviewed and are negative.      Objective:   Physical Exam Vitals reviewed.  HENT:     Head: Normocephalic.  Cardiovascular:     Rate and Rhythm: Normal rate.     Pulses:          Radial pulses are 2+ on the left side.     Arteriovenous access: Left arteriovenous access is present.    Comments: Good thrill and bruit Pulmonary:     Effort: Pulmonary effort is normal.  Skin:    General: Skin is warm and dry.  Neurological:     Mental Status: She is alert and oriented to person, place, and time.  Psychiatric:        Mood and Affect: Mood normal.        Behavior: Behavior normal.        Thought Content: Thought content normal.        Judgment: Judgment normal.     BP 116/81 (BP Location: Right Arm)   Pulse 93   Resp 18   Wt 159 lb 3.2 oz (72.2 kg)   BMI 25.70 kg/m   Past Medical History:  Diagnosis Date   Adult behavior problems    frontal lobe CVA   Anemia    chronic disease   Atrial flutter by electrocardiogram ([place]) 03/22/2014   Cardiomyopathy ([place])    CHF (congestive heart failure) ([place])    Chicken pox    Coronary artery disease    Delayed surgical wound healing 04/12/2014  Overview:  Left leg   GERD (gastroesophageal reflux disease)    Heart failure (HCC)    Hepatitis    history of hep c   History of CVA (cerebrovascular accident) 02/21/2014   Hyperlipidemia    Hypertension    Hypotension 02/24/2014   Myocardial infarction [facility])    Obesity    Paroxysmal atrial fibrillation (HCC)    Peripheral vascular disease (HCC)    Renal failure    Renal insufficiency    Stroke [facility]) 2011   Superficial incisional surgical site infection 03/14/2014    Social History   Socioeconomic History   Marital status: Single    Spouse name: Not on file   Number of children: Not on file   Years of education: Not on file   Highest education level: Not on file  Occupational History   Not on file  Tobacco Use   Smoking status: Some Days     Packs/day: 0.25    Years: 20.00    Total pack years: 5.00    Types: Cigarettes   Smokeless tobacco: Never   Tobacco comments:    smokes two cigarettes a day  Vaping Use   Vaping Use: Never used  Substance and Sexual Activity   Alcohol use: No   Drug use: No   Sexual activity: Not Currently    Birth control/protection: Post-menopausal  Other Topics Concern   Not on file  Social History Narrative   Not on file   Social Determinants of Health   Financial Resource Strain: Not on file  Food Insecurity: Not on file  Transportation Needs: Not on file  Physical Activity: Not on file  Stress: Not on file  Social Connections: Not on file  Intimate Partner Violence: Not on file    Past Surgical History:  Procedure Laterality Date   A/V FISTULAGRAM Left 03/30/2019   Procedure: A/V FISTULAGRAM;  Surgeon: Algernon Huxley, MD;  Location: [place] [facility];  Service: Cardiovascular;  Laterality: Left;   A/V FISTULAGRAM Left 10/06/2019   Procedure: A/V FISTULAGRAM;  Surgeon: Algernon Huxley, MD;  Location: [place] [facility];  Service: Cardiovascular;  Laterality: Left;   A/V FISTULAGRAM Left 05/19/2021   Procedure: A/V FISTULAGRAM;  Surgeon: Algernon Huxley, MD;  Location: [place] [facility];  Service: Cardiovascular;  Laterality: Left;   A/V FISTULAGRAM Left 01/29/2022   Procedure: A/V Fistulagram;  Surgeon: Algernon Huxley, MD;  Location: [place] [facility];  Service: Cardiovascular;  Laterality: Left;   A/V FISTULAGRAM Left 04/30/2022   Procedure: A/V Fistulagram;  Surgeon: Algernon Huxley, MD;  Location: [place] [facility];  Service: Cardiovascular;  Laterality: Left;   COLONOSCOPY     COLONOSCOPY WITH PROPOFOL N/A 04/22/2021   Procedure: COLONOSCOPY WITH PROPOFOL;  Surgeon: Lesly Rubenstein, MD;  Location: [facility] ENDOSCOPY;  Service: Endoscopy;  Laterality: N/A;   CORONARY ANGIOPLASTY WITH STENT PLACEMENT  2013   CORONARY ARTERY BYPASS GRAFT     DIALYSIS FISTULA CREATION      ESOPHAGOGASTRODUODENOSCOPY     ESOPHAGOGASTRODUODENOSCOPY N/A 04/22/2021   Procedure: ESOPHAGOGASTRODUODENOSCOPY (EGD);  Surgeon: Lesly Rubenstein, MD;  Location: [facility] ENDOSCOPY;  Service: Endoscopy;  Laterality: N/A;   FLEXIBLE SIGMOIDOSCOPY N/A 02/23/2019   Procedure: FLEXIBLE SIGMOIDOSCOPY;  Surgeon: Lollie Sails, MD;  Location: [facility] ENDOSCOPY;  Service: Endoscopy;  Laterality: N/A;   LEFT HEART CATH AND CORS/GRAFTS ANGIOGRAPHY N/A 08/08/2018   Procedure: LEFT HEART CATH AND CORS/GRAFTS ANGIOGRAPHY;  Surgeon: Dionisio David, MD;  Location:  [place] [facility];  Service: Cardiovascular;  Laterality: N/A;   LEFT HEART CATH AND CORS/GRAFTS ANGIOGRAPHY N/A 01/30/2019   Procedure: LEFT HEART CATH AND CORS/GRAFTS ANGIOGRAPHY;  Surgeon: Dionisio David, MD;  Location: [place] [facility];  Service: Cardiovascular;  Laterality: N/A;   percutaneous insertion intra aortic balloon right cath     ultrasound guided pericardiocentesis      Family History  Problem Relation Age of Onset   Hypertension Mother    Ovarian cancer Mother    Diabetes type II Mother    Hypertension Father    Kidney disease Father    Hypertension Sister    Diabetes type II Maternal Grandmother    Breast cancer Maternal Grandmother    Breast cancer Maternal Aunt    Hypertension Other    Cancer Other    Renal Disease Other     Allergies  Allergen Reactions   Morphine And Related Hives   Levaquin [Levofloxacin] Itching    Severe itching; prickly sensation   Other    Latex Rash   Tape Rash       Latest Ref Rng & Units 02/16/2022   11:55 PM 02/16/2022    6:42 PM 01/30/2022    9:56 AM  CBC  WBC 4.0 - 10.5 K/uL 6.8  6.5  3.8   Hemoglobin 12.0 - 15.0 g/dL 11.0  12.6  10.1   Hematocrit 36.0 - 46.0 % 34.5  39.9  32.8   Platelets 150 - 400 K/uL 140  146  127       CMP     Component Value Date/Time   NA 138 02/16/2022 2355   NA 139 07/09/2014 0503   K 5.2 (H) 02/16/2022 2355   K 4.7 07/11/2014 0958    CL 102 02/16/2022 2355   CL 98 07/09/2014 0503   CO2 22 02/16/2022 2355   CO2 30 07/09/2014 0503   GLUCOSE 70 02/16/2022 2355   GLUCOSE 107 (H) 07/09/2014 0503   BUN 43 (H) 02/16/2022 2355   BUN 39 (H) 07/09/2014 0503   CREATININE 8.45 (H) 02/16/2022 2355   CREATININE 5.53 (H) 07/09/2014 0503   CALCIUM 8.1 (L) 02/16/2022 2355   CALCIUM 8.3 (L) 07/09/2014 0503   PROT 6.7 02/16/2022 2215   PROT 8.4 (H) 07/07/2014 2207   ALBUMIN 3.5 02/16/2022 2355   ALBUMIN 3.1 (L) 07/07/2014 2207   AST 21 02/16/2022 2215   AST 76 (H) 07/07/2014 2207   ALT 10 02/16/2022 2215   ALT 41 07/07/2014 2207   ALKPHOS 63 02/16/2022 2215   ALKPHOS 107 07/07/2014 2207   BILITOT 1.0 02/16/2022 2215   BILITOT 0.7 07/07/2014 2207   GFRNONAA 5 (L) 02/16/2022 2355   GFRNONAA 9 (L) 07/09/2014 0503   GFRNONAA 9 (L) 02/11/2014 0300   GFRAA 6 (L) 02/06/2020 0708   GFRAA 10 (L) 07/09/2014 0503   GFRAA 10 (L) 02/11/2014 0300     No results found.     Assessment & Plan:   1. ESRD (end stage renal disease) ([place]) Recommend:  The patient is doing well and currently has adequate dialysis access. The patient's dialysis center is not reporting any access issues. Flow pattern is stable when compared to the prior ultrasound.  The patient should have a duplex ultrasound of the dialysis access in 6 months. The patient will follow-up with me in the office after each ultrasound    2. PAD (peripheral artery disease) (HCC) Today the patient's noninvasive studies are normal with normal triphasic  waveforms and warm toes.  The discoloration that the patient is concerned about is likely related to venous insufficiency as she does have a number of varicosities more approximately.  We discussed the possibility of obtaining a reflux study to better evaluate but the patient does not wish to move forward with that at this time.  Instead the patient will engage in conservative therapy including use of medical grade compression,  elevation and activity.  We can continue to follow the patient on an annual basis. 3. Primary hypertension Continue antihypertensive medications as already ordered, these medications have been reviewed and there are no changes at this time.  4. Numbness and tingling Based on the patient's noninvasive studies today as well as her prescriptions and numbness and tingling, I suspect that this is largely related to neuropathy.  She does have an upcoming visit with neurology which will follow-up on evaluation for possible neuropathy.   Current Outpatient Medications on File Prior to Visit  Medication Sig Dispense Refill   amiodarone (PACERONE) 200 MG tablet Take 200 mg by mouth daily.     apixaban (ELIQUIS) 5 MG TABS tablet Take 5 mg by mouth 2 (two) times daily.     cinacalcet (SENSIPAR) 90 MG tablet Take 90 mg by mouth daily.     hydrOXYzine (ATARAX/VISTARIL) 50 MG tablet Take 50 mg by mouth at bedtime.     lactulose (CHRONULAC) 10 GM/15ML solution SMARTSIG:Milliliter(s) By Mouth     levothyroxine (SYNTHROID) 75 MCG tablet Take 75 mcg by mouth daily before breakfast.      lidocaine-prilocaine (EMLA) cream Apply 1 application topically daily as needed (port access).     midodrine (PROAMATINE) 10 MG tablet Take 1 tablet (10 mg total) by mouth 3 (three) times a week. Monday, Wednesday, Friday with hemodialysis     nitroGLYCERIN (NITROSTAT) 0.4 MG SL tablet Place 1 tablet (0.4 mg total) under the tongue every 5 (five) minutes as needed for chest pain. 30 tablet 0   oxyCODONE-acetaminophen (PERCOCET/ROXICET) 5-325 MG tablet Take 1 tablet by mouth 2 (two) times daily as needed for moderate pain.     pantoprazole (PROTONIX) 40 MG tablet Take 40 mg by mouth daily.     rosuvastatin (CRESTOR) 20 MG tablet Take 20 mg by mouth at bedtime.     sevelamer carbonate (RENVELA) 800 MG tablet Take 3,200 mg by mouth 3 (three) times daily with meals.     albuterol (VENTOLIN HFA) 108 (90 Base) MCG/ACT inhaler Inhale  into the lungs. (Patient not taking: Reported on 06/18/2022)     carvedilol (COREG) 6.25 MG tablet Take 0.5 tablets (3.125 mg total) by mouth 2 (two) times daily. (Patient not taking: Reported on 04/01/2022)     dapagliflozin propanediol (FARXIGA) 10 MG TABS tablet Take 10 mg by mouth daily. (Patient not taking: Reported on 01/29/2022)     losartan (COZAAR) 25 MG tablet Take 12.5 mg by mouth daily. (Patient not taking: Reported on 04/01/2022)     ranolazine (RANEXA) 1000 MG SR tablet Take 1 tablet (1,000 mg total) by mouth 2 (two) times daily. 60 tablet 2   Current Facility-Administered Medications on File Prior to Visit  Medication Dose Route Frequency Provider Last Rate Last Admin   sodium chloride flush (NS) 0.9 % injection 3 mL  3 mL Intravenous Q12H Neoma Laming A, MD   350 mL at 10/06/19 1212    There are no Patient Instructions on file for this visit. No follow-ups on file.   Kris Hartmann, NP

## 2022-06-19 DIAGNOSIS — Z992 Dependence on renal dialysis: Secondary | ICD-10-CM | POA: Diagnosis not present

## 2022-06-19 DIAGNOSIS — N186 End stage renal disease: Secondary | ICD-10-CM | POA: Diagnosis not present

## 2022-06-22 DIAGNOSIS — N186 End stage renal disease: Secondary | ICD-10-CM | POA: Diagnosis not present

## 2022-06-22 DIAGNOSIS — Z992 Dependence on renal dialysis: Secondary | ICD-10-CM | POA: Diagnosis not present

## 2022-06-23 ENCOUNTER — Ambulatory Visit
Admission: RE | Admit: 2022-06-23 | Discharge: 2022-06-23 | Disposition: A | Discharge status: Home / Self Care | Payer: Medicare Other | Source: Ambulatory Visit | Attending: Internal Medicine | Admitting: Internal Medicine

## 2022-06-23 DIAGNOSIS — N186 End stage renal disease: Secondary | ICD-10-CM | POA: Diagnosis not present

## 2022-06-23 DIAGNOSIS — R188 Other ascites: Secondary | ICD-10-CM | POA: Insufficient documentation

## 2022-06-23 MED ORDER — LIDOCAINE HCL (PF) 1 % IJ SOLN
10.0000 mL | Freq: Once | INTRAMUSCULAR | Status: AC
  Administered 2022-06-23: 10 mL via INTRADERMAL
  Filled 2022-06-23: qty 10

## 2022-06-23 NOTE — Discharge Instructions (Signed)
Discharge instructions reviewed with patient.

## 2022-06-23 NOTE — Procedures (Signed)
PROCEDURE SUMMARY:  Successful image-guided paracentesis from the right lower abdomen.  Yielded 2.8 liters of amber fluid.  No immediate complications.  EBL = trace. Patient tolerated well.   Specimen was not sent for labs.  Please see imaging section of Epic for full dictation.   Lura Em PA-C 06/23/2022 12:44 PM

## 2022-06-25 DIAGNOSIS — Z8673 Personal history of transient ischemic attack (TIA), and cerebral infarction without residual deficits: Secondary | ICD-10-CM | POA: Diagnosis not present

## 2022-06-25 DIAGNOSIS — R519 Headache, unspecified: Secondary | ICD-10-CM | POA: Diagnosis not present

## 2022-06-25 DIAGNOSIS — R42 Dizziness and giddiness: Secondary | ICD-10-CM | POA: Diagnosis not present

## 2022-06-25 DIAGNOSIS — I509 Heart failure, unspecified: Secondary | ICD-10-CM | POA: Diagnosis not present

## 2022-06-25 DIAGNOSIS — R296 Repeated falls: Secondary | ICD-10-CM | POA: Diagnosis not present

## 2022-06-26 DIAGNOSIS — N186 End stage renal disease: Secondary | ICD-10-CM | POA: Diagnosis not present

## 2022-06-26 DIAGNOSIS — Z992 Dependence on renal dialysis: Secondary | ICD-10-CM | POA: Diagnosis not present

## 2022-06-29 DIAGNOSIS — N186 End stage renal disease: Secondary | ICD-10-CM | POA: Diagnosis not present

## 2022-06-29 DIAGNOSIS — Z992 Dependence on renal dialysis: Secondary | ICD-10-CM | POA: Diagnosis not present

## 2022-07-01 DIAGNOSIS — Z992 Dependence on renal dialysis: Secondary | ICD-10-CM | POA: Diagnosis not present

## 2022-07-01 DIAGNOSIS — N186 End stage renal disease: Secondary | ICD-10-CM | POA: Diagnosis not present

## 2022-07-03 DIAGNOSIS — Z992 Dependence on renal dialysis: Secondary | ICD-10-CM | POA: Diagnosis not present

## 2022-07-03 DIAGNOSIS — N186 End stage renal disease: Secondary | ICD-10-CM | POA: Diagnosis not present

## 2022-07-06 ENCOUNTER — Other Ambulatory Visit: Payer: Self-pay | Admitting: Internal Medicine

## 2022-07-06 DIAGNOSIS — R188 Other ascites: Secondary | ICD-10-CM

## 2022-07-06 DIAGNOSIS — Z992 Dependence on renal dialysis: Secondary | ICD-10-CM | POA: Diagnosis not present

## 2022-07-06 DIAGNOSIS — N186 End stage renal disease: Secondary | ICD-10-CM | POA: Diagnosis not present

## 2022-07-07 ENCOUNTER — Ambulatory Visit
Admission: RE | Admit: 2022-07-07 | Discharge: 2022-07-07 | Disposition: A | Discharge status: Home / Self Care | Payer: 59 | Source: Ambulatory Visit | Attending: Internal Medicine | Admitting: Internal Medicine

## 2022-07-07 DIAGNOSIS — R188 Other ascites: Secondary | ICD-10-CM | POA: Insufficient documentation

## 2022-07-07 MED ORDER — LIDOCAINE HCL (PF) 1 % IJ SOLN
20.0000 mL | Freq: Once | INTRAMUSCULAR | Status: AC
  Administered 2022-07-07: 20 mL via INTRADERMAL
  Filled 2022-07-07: qty 20

## 2022-07-07 NOTE — Procedures (Signed)
Interventional Radiology Procedure:   Indications: ESRD and recurrent ascites  Procedure: US guided paracentesis  Findings: Removed 3150 ml from left lower quadrant  Complications: None     EBL: Less than 10 ml   Leda [place]ille R. Anselm Pancoast, MD  Pager: [phone]

## 2022-07-08 DIAGNOSIS — Z992 Dependence on renal dialysis: Secondary | ICD-10-CM | POA: Diagnosis not present

## 2022-07-08 DIAGNOSIS — N186 End stage renal disease: Secondary | ICD-10-CM | POA: Diagnosis not present

## 2022-07-09 DIAGNOSIS — I10 Essential (primary) hypertension: Secondary | ICD-10-CM | POA: Diagnosis not present

## 2022-07-09 DIAGNOSIS — N185 Chronic kidney disease, stage 5: Secondary | ICD-10-CM | POA: Diagnosis not present

## 2022-07-09 DIAGNOSIS — E8779 Other fluid overload: Secondary | ICD-10-CM | POA: Diagnosis not present

## 2022-07-09 DIAGNOSIS — E782 Mixed hyperlipidemia: Secondary | ICD-10-CM | POA: Diagnosis not present

## 2022-07-09 DIAGNOSIS — N186 End stage renal disease: Secondary | ICD-10-CM | POA: Diagnosis not present

## 2022-07-09 DIAGNOSIS — I25110 Atherosclerotic heart disease of native coronary artery with unstable angina pectoris: Secondary | ICD-10-CM | POA: Diagnosis not present

## 2022-07-09 DIAGNOSIS — I5042 Chronic combined systolic (congestive) and diastolic (congestive) heart failure: Secondary | ICD-10-CM | POA: Diagnosis not present
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
## 2022-07-09 IMAGING — US US CAROTID DUPLEX BILAT
1 series · 13 of 24 positions shown · non-contrast
Comparison: 06/24/2014

CLINICAL DATA: Syncope

EXAM:
BILATERAL CAROTID DUPLEX ULTRASOUND
TECHNIQUE: Gray scale imaging, color Doppler and duplex ultrasound were
performed of bilateral carotid and vertebral arteries in the neck.

[Series 1: us carotid bilateral · 13 of 62 slices shown]
[im 1/62]
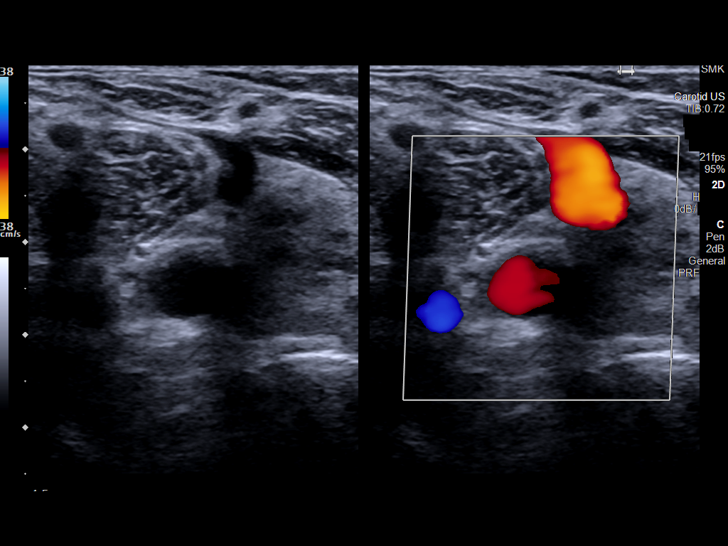
[im 6/62]
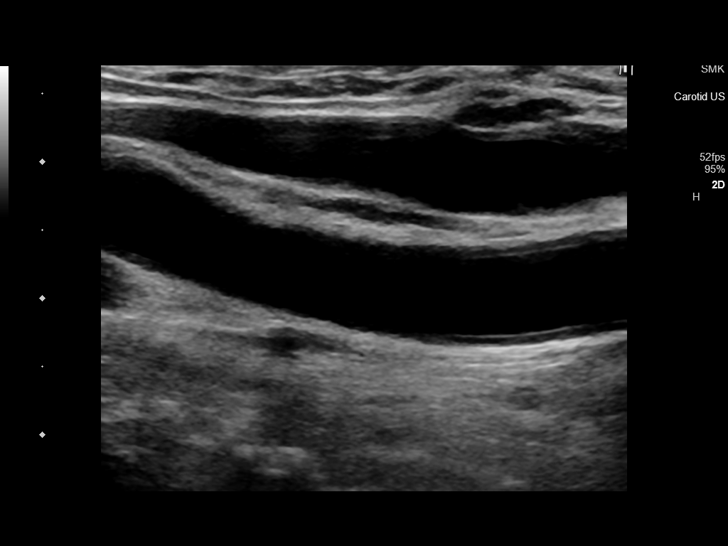
[im 11/62]
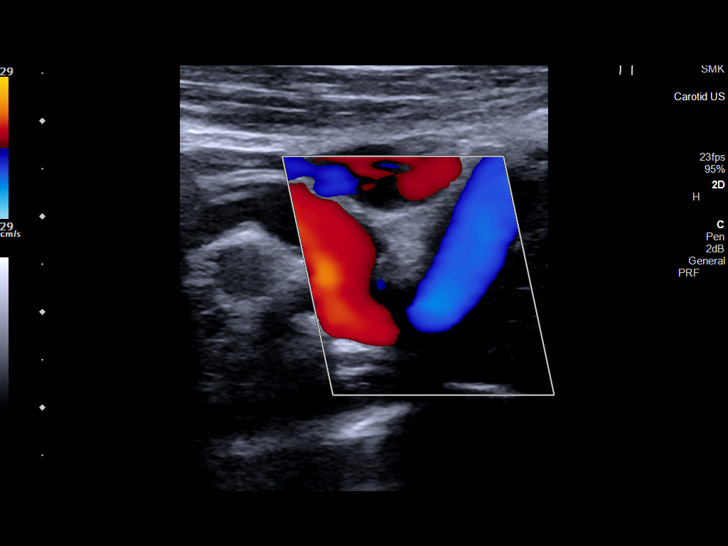
[im 16/62]
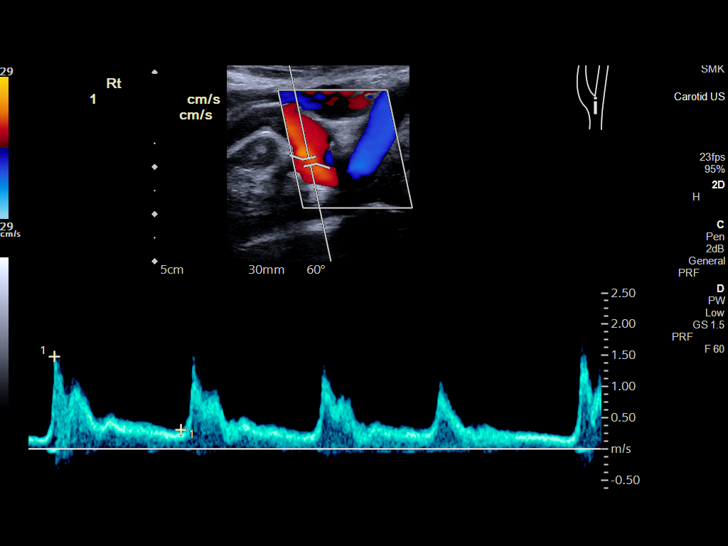
[im 22/62]
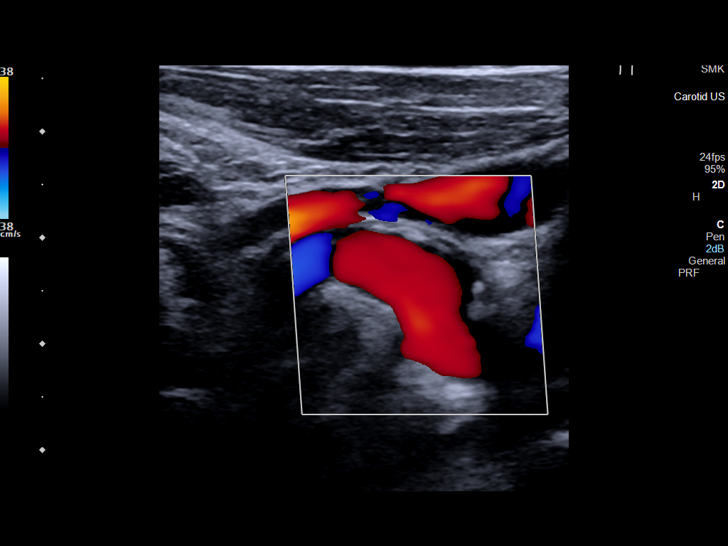
[im 27/62]
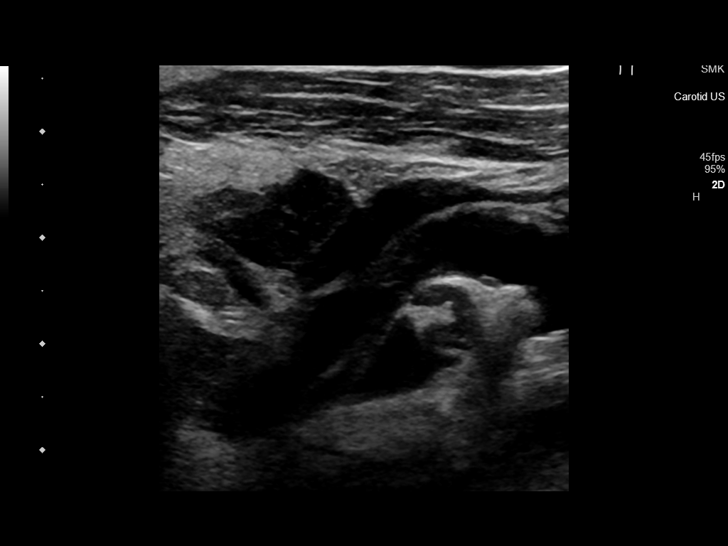
[im 32/62]
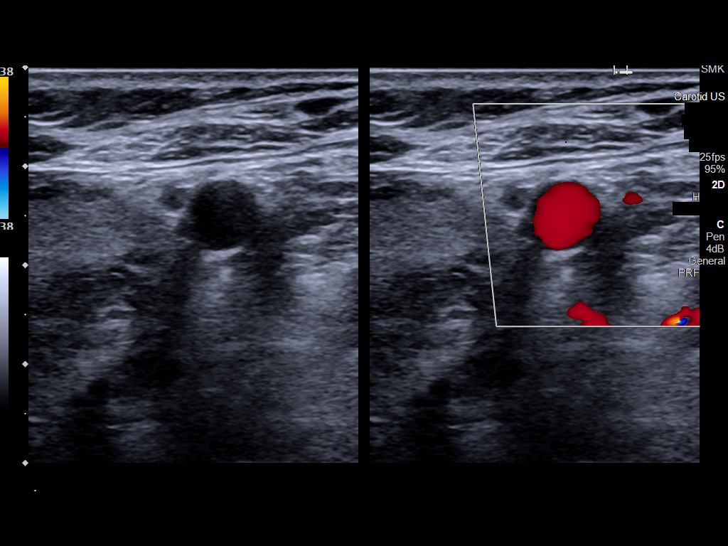
[im 35/62]
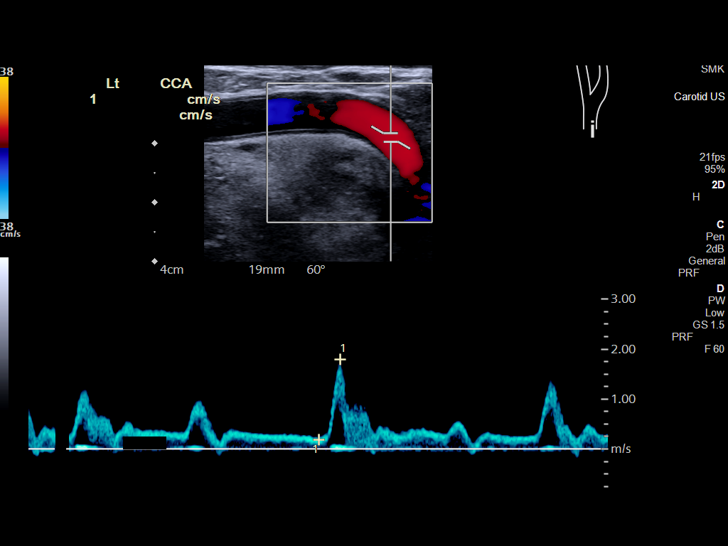
[im 40/62]
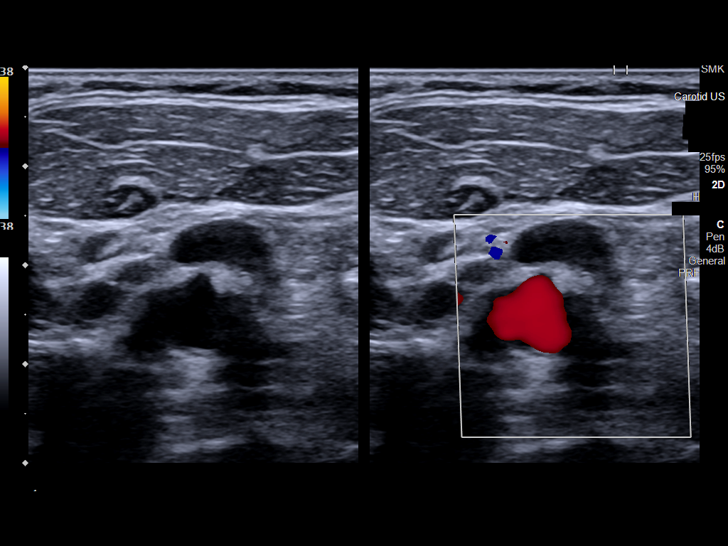
[im 46/62]
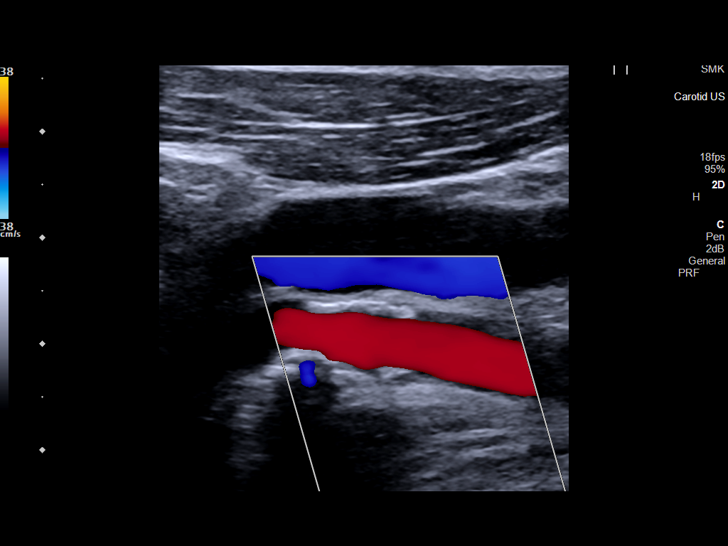
[im 51/62]
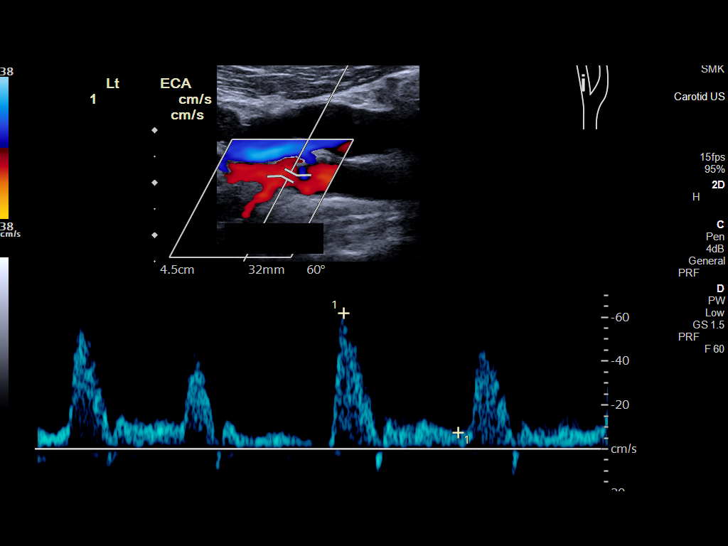
[im 56/62]
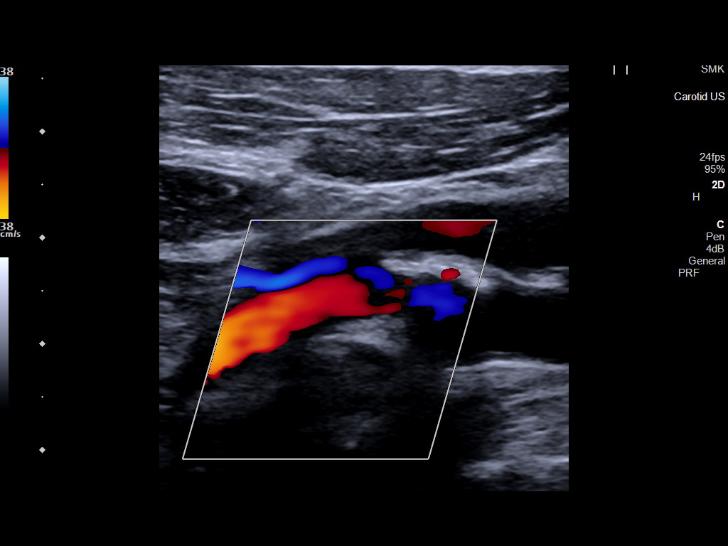
[im 62/62]
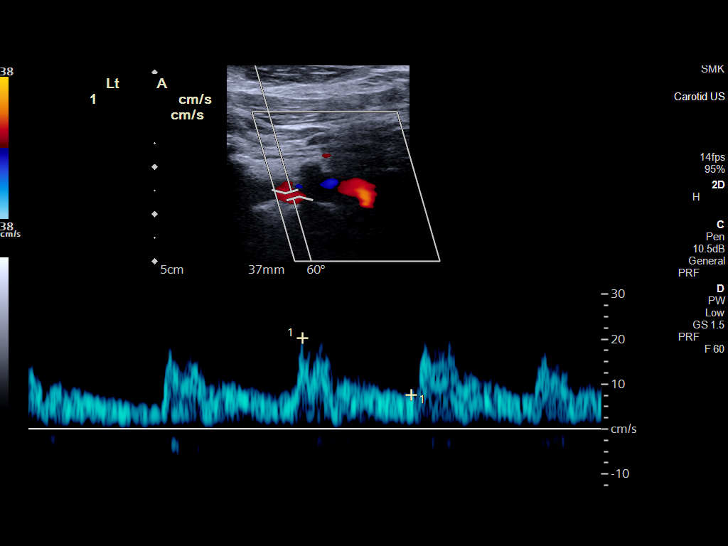

[13 of 24 positions shown; findings below may reference images not displayed]

FINDINGS: Criteria: Quantification of carotid stenosis is based on velocity
parameters that correlate the residual internal carotid diameter
with NASCET-based stenosis levels, using the diameter of the distal
internal carotid lumen as the denominator for stenosis measurement.

The following velocity measurements were obtained:

RIGHT

ICA: 159/22 cm/sec

CCA: 131/22 cm/sec

SYSTOLIC ICA/CCA RATIO:

ECA: 110 cm/sec

LEFT

ICA: 59/25 cm/sec

CCA: 108/20 cm/sec

SYSTOLIC ICA/CCA RATIO:

ECA: 62 cm/sec

RIGHT CAROTID ARTERY: Diffuse intimal thickening in the common
carotid. Moderate tortuosity. Eccentric partially calcified plaque
at the bifurcation and in the proximal ICA with mild stenosis.
Normal waveforms and color Doppler signal throughout.

RIGHT VERTEBRAL ARTERY:  Normal flow direction and waveform.

LEFT CAROTID ARTERY: Mild tortuosity. Somewhat irregular plaque in
the bulb extending into proximal internal and external carotid
arteries with mild stenosis. Normal waveforms and color Doppler
signal. A nonspecific cardiac arrhythmia is noted.

LEFT VERTEBRAL ARTERY:  Normal flow direction and waveform.
IMPRESSION: 1. Bilateral carotid bifurcation plaque resulting in less than 50%
diameter ICA stenosis.
2. Antegrade bilateral vertebral arterial flow.
3. Nonspecific cardiac arrhythmia.

## 2022-07-10 DIAGNOSIS — Z992 Dependence on renal dialysis: Secondary | ICD-10-CM | POA: Diagnosis not present

## 2022-07-10 DIAGNOSIS — N186 End stage renal disease: Secondary | ICD-10-CM | POA: Diagnosis not present

## 2022-07-13 DIAGNOSIS — Z992 Dependence on renal dialysis: Secondary | ICD-10-CM | POA: Diagnosis not present

## 2022-07-13 DIAGNOSIS — N186 End stage renal disease: Secondary | ICD-10-CM | POA: Diagnosis not present

## 2022-07-14 DIAGNOSIS — N186 End stage renal disease: Secondary | ICD-10-CM | POA: Diagnosis not present

## 2022-07-14 DIAGNOSIS — E785 Hyperlipidemia, unspecified: Secondary | ICD-10-CM | POA: Diagnosis not present

## 2022-07-14 DIAGNOSIS — I120 Hypertensive chronic kidney disease with stage 5 chronic kidney disease or end stage renal disease: Secondary | ICD-10-CM | POA: Diagnosis not present

## 2022-07-15 DIAGNOSIS — Z992 Dependence on renal dialysis: Secondary | ICD-10-CM | POA: Diagnosis not present

## 2022-07-15 DIAGNOSIS — N186 End stage renal disease: Secondary | ICD-10-CM | POA: Diagnosis not present

## 2022-07-16 ENCOUNTER — Other Ambulatory Visit: Payer: Self-pay | Admitting: Internal Medicine

## 2022-07-16 DIAGNOSIS — N186 End stage renal disease: Secondary | ICD-10-CM | POA: Diagnosis not present

## 2022-07-16 DIAGNOSIS — R188 Other ascites: Secondary | ICD-10-CM

## 2022-07-16 DIAGNOSIS — Z992 Dependence on renal dialysis: Secondary | ICD-10-CM | POA: Diagnosis not present

## 2022-07-17 DIAGNOSIS — N186 End stage renal disease: Secondary | ICD-10-CM | POA: Diagnosis not present

## 2022-07-17 DIAGNOSIS — Z992 Dependence on renal dialysis: Secondary | ICD-10-CM | POA: Diagnosis not present

## 2022-07-20 ENCOUNTER — Encounter: Payer: Self-pay | Admitting: *Deleted

## 2022-07-20 DIAGNOSIS — Z992 Dependence on renal dialysis: Secondary | ICD-10-CM | POA: Diagnosis not present

## 2022-07-20 DIAGNOSIS — N186 End stage renal disease: Secondary | ICD-10-CM | POA: Diagnosis not present

## 2022-07-21 ENCOUNTER — Ambulatory Visit: Admission: RE | Admit: 2022-07-21 | Payer: 59 | Source: Home / Self Care

## 2022-07-21 ENCOUNTER — Encounter: Admission: RE | Payer: Self-pay | Source: Home / Self Care

## 2022-07-21 SURGERY — COLONOSCOPY WITH PROPOFOL
Anesthesia: General

## 2022-07-22 DIAGNOSIS — N186 End stage renal disease: Secondary | ICD-10-CM | POA: Diagnosis not present

## 2022-07-22 DIAGNOSIS — Z992 Dependence on renal dialysis: Secondary | ICD-10-CM | POA: Diagnosis not present

## 2022-07-23 ENCOUNTER — Ambulatory Visit
Admission: RE | Admit: 2022-07-23 | Discharge: 2022-07-23 | Disposition: A | Discharge status: Home / Self Care | Payer: 59 | Source: Ambulatory Visit | Attending: Internal Medicine | Admitting: Internal Medicine

## 2022-07-23 DIAGNOSIS — R188 Other ascites: Secondary | ICD-10-CM | POA: Insufficient documentation

## 2022-07-23 MED ORDER — LIDOCAINE HCL (PF) 1 % IJ SOLN
15.0000 mL | Freq: Once | INTRAMUSCULAR | Status: AC
  Administered 2022-07-23: 15 mL via SUBCUTANEOUS
  Filled 2022-07-23: qty 15

## 2022-07-23 NOTE — Procedures (Signed)
PROCEDURE SUMMARY:  Successful US guided paracentesis from RLQ. Yielded 1.6 L of clear yellow fluid.  No immediate complications.  Pt tolerated well.   Specimen was not sent for labs.  EBL < 40m  MRockney Ghee2/01/2023 2:41 PM

## 2022-07-23 NOTE — Discharge Instructions (Signed)
Instructions discussed with patient. PCS

## 2022-07-24 DIAGNOSIS — N186 End stage renal disease: Secondary | ICD-10-CM | POA: Diagnosis not present

## 2022-07-24 DIAGNOSIS — Z992 Dependence on renal dialysis: Secondary | ICD-10-CM | POA: Diagnosis not present

## 2022-07-26 DIAGNOSIS — I509 Heart failure, unspecified: Secondary | ICD-10-CM | POA: Diagnosis not present

## 2022-07-27 ENCOUNTER — Other Ambulatory Visit: Payer: Self-pay | Admitting: Nephrology

## 2022-07-27 DIAGNOSIS — Z992 Dependence on renal dialysis: Secondary | ICD-10-CM | POA: Diagnosis not present

## 2022-07-27 DIAGNOSIS — R188 Other ascites: Secondary | ICD-10-CM

## 2022-07-27 DIAGNOSIS — N186 End stage renal disease: Secondary | ICD-10-CM | POA: Diagnosis not present

## 2022-07-28 ENCOUNTER — Ambulatory Visit
Admission: RE | Admit: 2022-07-28 | Discharge: 2022-07-28 | Disposition: A | Discharge status: Home / Self Care | Payer: 59 | Source: Ambulatory Visit | Attending: Nephrology | Admitting: Nephrology

## 2022-07-28 DIAGNOSIS — R188 Other ascites: Secondary | ICD-10-CM | POA: Insufficient documentation

## 2022-07-28 DIAGNOSIS — N186 End stage renal disease: Secondary | ICD-10-CM | POA: Diagnosis not present

## 2022-07-28 MED ORDER — LIDOCAINE HCL (PF) 1 % IJ SOLN
10.0000 mL | Freq: Once | INTRAMUSCULAR | Status: AC
  Administered 2022-07-28: 10 mL via INTRADERMAL

## 2022-07-28 NOTE — Procedures (Signed)
PROCEDURE SUMMARY:  Successful US guided therapeutic paracentesis from RLQ.  Yielded 2 L of clear, amber fluid.  No immediate complications.  Pt tolerated well.   Specimen not sent for labs.  EBL < 1 mL  Tyson Alias, AGNP 07/28/2022 3:30 PM

## 2022-07-29 DIAGNOSIS — Z992 Dependence on renal dialysis: Secondary | ICD-10-CM | POA: Diagnosis not present

## 2022-07-29 DIAGNOSIS — N186 End stage renal disease: Secondary | ICD-10-CM | POA: Diagnosis not present

## 2022-07-31 DIAGNOSIS — N186 End stage renal disease: Secondary | ICD-10-CM | POA: Diagnosis not present

## 2022-07-31 DIAGNOSIS — Z992 Dependence on renal dialysis: Secondary | ICD-10-CM | POA: Diagnosis not present

## 2022-08-03 DIAGNOSIS — N186 End stage renal disease: Secondary | ICD-10-CM | POA: Diagnosis not present

## 2022-08-03 DIAGNOSIS — Z992 Dependence on renal dialysis: Secondary | ICD-10-CM | POA: Diagnosis not present

## 2022-08-05 DIAGNOSIS — N186 End stage renal disease: Secondary | ICD-10-CM | POA: Diagnosis not present

## 2022-08-05 DIAGNOSIS — Z992 Dependence on renal dialysis: Secondary | ICD-10-CM | POA: Diagnosis not present

## 2022-08-10 ENCOUNTER — Other Ambulatory Visit (INDEPENDENT_AMBULATORY_CARE_PROVIDER_SITE_OTHER): Payer: Self-pay | Admitting: Nurse Practitioner

## 2022-08-10 DIAGNOSIS — N186 End stage renal disease: Secondary | ICD-10-CM | POA: Diagnosis not present

## 2022-08-10 DIAGNOSIS — Z992 Dependence on renal dialysis: Secondary | ICD-10-CM | POA: Diagnosis not present

## 2022-08-12 ENCOUNTER — Ambulatory Visit (INDEPENDENT_AMBULATORY_CARE_PROVIDER_SITE_OTHER): Payer: 59

## 2022-08-12 ENCOUNTER — Ambulatory Visit (INDEPENDENT_AMBULATORY_CARE_PROVIDER_SITE_OTHER): Payer: 59 | Admitting: Nurse Practitioner

## 2022-08-12 ENCOUNTER — Encounter (INDEPENDENT_AMBULATORY_CARE_PROVIDER_SITE_OTHER): Payer: Self-pay | Admitting: Nurse Practitioner

## 2022-08-12 VITALS — BP 107/72 | HR 82 | Resp 18 | Ht 66.0 in | Wt 162.0 lb

## 2022-08-12 DIAGNOSIS — N186 End stage renal disease: Secondary | ICD-10-CM

## 2022-08-12 DIAGNOSIS — I10 Essential (primary) hypertension: Secondary | ICD-10-CM | POA: Diagnosis not present

## 2022-08-12 DIAGNOSIS — E785 Hyperlipidemia, unspecified: Secondary | ICD-10-CM

## 2022-08-13 ENCOUNTER — Other Ambulatory Visit: Payer: Self-pay | Admitting: Internal Medicine

## 2022-08-13 DIAGNOSIS — N186 End stage renal disease: Secondary | ICD-10-CM | POA: Diagnosis not present

## 2022-08-13 DIAGNOSIS — Z992 Dependence on renal dialysis: Secondary | ICD-10-CM | POA: Diagnosis not present

## 2022-08-13 DIAGNOSIS — R188 Other ascites: Secondary | ICD-10-CM

## 2022-08-14 DIAGNOSIS — N2581 Secondary hyperparathyroidism of renal origin: Secondary | ICD-10-CM | POA: Diagnosis not present

## 2022-08-14 DIAGNOSIS — Z992 Dependence on renal dialysis: Secondary | ICD-10-CM | POA: Diagnosis not present

## 2022-08-14 DIAGNOSIS — N186 End stage renal disease: Secondary | ICD-10-CM | POA: Diagnosis not present

## 2022-08-17 ENCOUNTER — Telehealth (INDEPENDENT_AMBULATORY_CARE_PROVIDER_SITE_OTHER): Payer: Self-pay

## 2022-08-17 DIAGNOSIS — Z992 Dependence on renal dialysis: Secondary | ICD-10-CM | POA: Diagnosis not present

## 2022-08-17 DIAGNOSIS — N2581 Secondary hyperparathyroidism of renal origin: Secondary | ICD-10-CM | POA: Diagnosis not present

## 2022-08-17 DIAGNOSIS — N186 End stage renal disease: Secondary | ICD-10-CM | POA: Diagnosis not present

## 2022-08-17 NOTE — Telephone Encounter (Signed)
I attempted to to contact the patient and realized she is at dialysis. I contacted [employer] and spoke with Vivien Rota and will fax over the pre-procedure instructions and the patient will contact me if she has questions.

## 2022-08-19 ENCOUNTER — Encounter (INDEPENDENT_AMBULATORY_CARE_PROVIDER_SITE_OTHER): Payer: Self-pay | Admitting: Nurse Practitioner

## 2022-08-19 ENCOUNTER — Ambulatory Visit
Admission: RE | Admit: 2022-08-19 | Discharge: 2022-08-19 | Disposition: A | Discharge status: Home / Self Care | Payer: 59 | Source: Ambulatory Visit | Attending: Internal Medicine | Admitting: Internal Medicine

## 2022-08-19 DIAGNOSIS — R188 Other ascites: Secondary | ICD-10-CM | POA: Diagnosis not present

## 2022-08-19 DIAGNOSIS — Z992 Dependence on renal dialysis: Secondary | ICD-10-CM | POA: Diagnosis not present

## 2022-08-19 DIAGNOSIS — N186 End stage renal disease: Secondary | ICD-10-CM | POA: Diagnosis not present

## 2022-08-19 DIAGNOSIS — N2581 Secondary hyperparathyroidism of renal origin: Secondary | ICD-10-CM | POA: Diagnosis not present

## 2022-08-19 MED ORDER — LIDOCAINE HCL (PF) 1 % IJ SOLN
10.0000 mL | Freq: Once | INTRAMUSCULAR | Status: AC
  Administered 2022-08-19: 10 mL via INTRADERMAL

## 2022-08-19 NOTE — H&P (View-Only) (Signed)
Subjective:    Patient ID: [name], female    DOB: 1961/01/25, 62 y.o.   MRN: [id number] Chief Complaint  Patient presents with   Follow-up    URGENT: LS 1.4.24 fb/jd. HDA + consult. unable to dialyze. referred by singh, taran    The patient returns to the office for follow up regarding a problem with their dialysis access.   The patient notes that she had an incidence last week where she was not able to dialyze at all.  The next dialysis session she was able to dialyze but noted that they pose some significant clots.  The patient denies redness or swelling at the access site. The patient denies fever or chills at home or while on dialysis.  No recent shortening of the patient's walking distance or new symptoms consistent with claudication.  No history of rest pain symptoms. No new ulcers or wounds of the lower extremities have occurred.  The patient denies amaurosis fugax or recent TIA symptoms. There are no recent neurological changes noted. There is no history of DVT, PE or superficial thrombophlebitis. No recent episodes of angina or shortness of breath documented.   Duplex ultrasound of the AV access shows a patent access.  The previously noted stenosis is significantly increased compared to last study.  Flow volume today is 1387 cc/min (previous flow volume was 1524 cc/min)    Review of Systems  All other systems reviewed and are negative.      Objective:   Physical Exam Vitals reviewed.  HENT:     Head: Normocephalic.  Cardiovascular:     Rate and Rhythm: Normal rate.     Pulses:          Radial pulses are 1+ on the right side and 1+ on the left side.  Pulmonary:     Effort: Pulmonary effort is normal.  Skin:    General: Skin is warm and dry.  Neurological:     Mental Status: She is alert and oriented to person, place, and time.  Psychiatric:        Mood and Affect: Mood normal.        Behavior: Behavior normal.        Thought Content: Thought content  normal.        Judgment: Judgment normal.     BP 107/72 (BP Location: Right Arm)   Pulse 82   Resp 18   Ht '5\' 6"'$  (1.676 m)   Wt 162 lb (73.5 kg)   BMI 26.15 kg/m   Past Medical History:  Diagnosis Date   Adult behavior problems    frontal lobe CVA   Anemia    chronic disease   Atrial flutter by electrocardiogram ([place]) 03/22/2014   Cardiomyopathy ([place])    CHF (congestive heart failure) ([place])    Chicken pox    Coronary artery disease    Delayed surgical wound healing 04/12/2014   Overview:  Left leg   GERD (gastroesophageal reflux disease)    Heart failure (H[facility])    Hepatitis    history of hep c   History of CVA (cerebrovascular accident) 02/21/2014   Hyperlipidemia    Hypertension    Hypotension 02/24/2014   Myocardial infarction [facility])    Obesity    Paroxysmal atrial fibrillation ([place])    Peripheral vascular disease ([place])    Renal failure    Renal insufficiency    Stroke [facility]) 2011   Superficial incisional surgical site infection 03/14/2014    Social History  Socioeconomic History   Marital status: Single    Spouse name: Not on file   Number of children: Not on file   Years of education: Not on file   Highest education level: Not on file  Occupational History   Not on file  Tobacco Use   Smoking status: Some Days    Packs/day: 0.25    Years: 20.00    Total pack years: 5.00    Types: Cigarettes   Smokeless tobacco: Never   Tobacco comments:    smokes two cigarettes a day  Vaping Use   Vaping Use: Never used  Substance and Sexual Activity   Alcohol use: No   Drug use: No   Sexual activity: Not Currently    Birth control/protection: Post-menopausal  Other Topics Concern   Not on file  Social History Narrative   Not on file   Social Determinants of Health   Financial Resource Strain: Not on file  Food Insecurity: Not on file  Transportation Needs: Not on file  Physical Activity: Not on file  Stress: Not on file  Social Connections: Not on  file  Intimate Partner Violence: Not on file    Past Surgical History:  Procedure Laterality Date   A/V FISTULAGRAM Left 03/30/2019   Procedure: A/V FISTULAGRAM;  Surgeon: Algernon Huxley, MD;  Location: [place] [facility];  Service: Cardiovascular;  Laterality: Left;   A/V FISTULAGRAM Left 10/06/2019   Procedure: A/V FISTULAGRAM;  Surgeon: Algernon Huxley, MD;  Location: [place] [facility];  Service: Cardiovascular;  Laterality: Left;   A/V FISTULAGRAM Left 05/19/2021   Procedure: A/V FISTULAGRAM;  Surgeon: Algernon Huxley, MD;  Location: [place] [facility];  Service: Cardiovascular;  Laterality: Left;   A/V FISTULAGRAM Left 01/29/2022   Procedure: A/V Fistulagram;  Surgeon: Algernon Huxley, MD;  Location: [place] [facility];  Service: Cardiovascular;  Laterality: Left;   A/V FISTULAGRAM Left 04/30/2022   Procedure: A/V Fistulagram;  Surgeon: Algernon Huxley, MD;  Location: [place] [facility];  Service: Cardiovascular;  Laterality: Left;   COLONOSCOPY     COLONOSCOPY WITH PROPOFOL N/A 04/22/2021   Procedure: COLONOSCOPY WITH PROPOFOL;  Surgeon: Lesly Rubenstein, MD;  Location: [facility] ENDOSCOPY;  Service: Endoscopy;  Laterality: N/A;   CORONARY ANGIOPLASTY WITH STENT PLACEMENT  2013   CORONARY ARTERY BYPASS GRAFT     DIALYSIS FISTULA CREATION     ESOPHAGOGASTRODUODENOSCOPY     ESOPHAGOGASTRODUODENOSCOPY N/A 04/22/2021   Procedure: ESOPHAGOGASTRODUODENOSCOPY (EGD);  Surgeon: Lesly Rubenstein, MD;  Location: [facility] ENDOSCOPY;  Service: Endoscopy;  Laterality: N/A;   FLEXIBLE SIGMOIDOSCOPY N/A 02/23/2019   Procedure: FLEXIBLE SIGMOIDOSCOPY;  Surgeon: Lollie Sails, MD;  Location: [facility] ENDOSCOPY;  Service: Endoscopy;  Laterality: N/A;   LEFT HEART CATH AND CORS/GRAFTS ANGIOGRAPHY N/A 08/08/2018   Procedure: LEFT HEART CATH AND CORS/GRAFTS ANGIOGRAPHY;  Surgeon: Dionisio David, MD;  Location: [place] [facility];  Service: Cardiovascular;  Laterality: N/A;   LEFT HEART CATH AND CORS/GRAFTS  ANGIOGRAPHY N/A 01/30/2019   Procedure: LEFT HEART CATH AND CORS/GRAFTS ANGIOGRAPHY;  Surgeon: Dionisio David, MD;  Location: [place] [facility];  Service: Cardiovascular;  Laterality: N/A;   percutaneous insertion intra aortic balloon right cath     ultrasound guided pericardiocentesis      Family History  Problem Relation Age of Onset   Hypertension Mother    Ovarian cancer Mother    Diabetes type II Mother    Hypertension Father  Kidney disease Father    Hypertension Sister    Diabetes type II Maternal Grandmother    Breast cancer Maternal Grandmother    Breast cancer Maternal Aunt    Hypertension Other    Cancer Other    Renal Disease Other     Allergies  Allergen Reactions   Morphine And Related Hives   Levaquin [Levofloxacin] Itching    Severe itching; prickly sensation   Other    Latex Rash   Tape Rash       Latest Ref Rng & Units 02/16/2022   11:55 PM 02/16/2022    6:42 PM 01/30/2022    9:56 AM  CBC  WBC 4.0 - 10.5 K/uL 6.8  6.5  3.8   Hemoglobin 12.0 - 15.0 g/dL 11.0  12.6  10.1   Hematocrit 36.0 - 46.0 % 34.5  39.9  32.8   Platelets 150 - 400 K/uL 140  146  127       CMP     Component Value Date/Time   NA 138 02/16/2022 2355   NA 139 07/09/2014 0503   K 5.2 (H) 02/16/2022 2355   K 4.7 07/11/2014 0958   CL 102 02/16/2022 2355   CL 98 07/09/2014 0503   CO2 22 02/16/2022 2355   CO2 30 07/09/2014 0503   GLUCOSE 70 02/16/2022 2355   GLUCOSE 107 (H) 07/09/2014 0503   BUN 43 (H) 02/16/2022 2355   BUN 39 (H) 07/09/2014 0503   CREATININE 8.45 (H) 02/16/2022 2355   CREATININE 5.53 (H) 07/09/2014 0503   CALCIUM 8.1 (L) 02/16/2022 2355   CALCIUM 8.3 (L) 07/09/2014 0503   PROT 6.7 02/16/2022 2215   PROT 8.4 (H) 07/07/2014 2207   ALBUMIN 3.5 02/16/2022 2355   ALBUMIN 3.1 (L) 07/07/2014 2207   AST 21 02/16/2022 2215   AST 76 (H) 07/07/2014 2207   ALT 10 02/16/2022 2215   ALT 41 07/07/2014 2207   ALKPHOS 63 02/16/2022 2215   ALKPHOS 107 07/07/2014  2207   BILITOT 1.0 02/16/2022 2215   BILITOT 0.7 07/07/2014 2207   GFRNONAA 5 (L) 02/16/2022 2355   GFRNONAA 9 (L) 07/09/2014 0503   GFRNONAA 9 (L) 02/11/2014 0300   GFRAA 6 (L) 02/06/2020 0708   GFRAA 10 (L) 07/09/2014 0503   GFRAA 10 (L) 02/11/2014 0300     No results found.     Assessment & Plan:   1. ESRD (end stage renal disease) ([place]) Recommend:  The patient is experiencing increasing problems with their dialysis access.  Patient should have a fistulagram with the intention for intervention.  The intention for intervention is to restore appropriate flow and prevent thrombosis and possible loss of the access.  As well as improve the quality of dialysis therapy.  The risks, benefits and alternative therapies were reviewed in detail with the patient.  All questions were answered.  The patient agrees to proceed with angio/intervention.    The patient will follow up with me in the office after the procedure.   2. Primary hypertension Continue antihypertensive medications as already ordered, these medications have been reviewed and there are no changes at this time.  3. Hyperlipidemia, unspecified hyperlipidemia type Continue statin as ordered and reviewed, no changes at this time   Current Outpatient Medications on File Prior to Visit  Medication Sig Dispense Refill   albuterol (VENTOLIN HFA) 108 (90 Base) MCG/ACT inhaler Inhale into the lungs.     amiodarone (PACERONE) 200 MG tablet Take 200 mg by mouth daily.  apixaban (ELIQUIS) 5 MG TABS tablet Take 5 mg by mouth 2 (two) times daily.     carvedilol (COREG) 6.25 MG tablet Take 0.5 tablets (3.125 mg total) by mouth 2 (two) times daily.     cinacalcet (SENSIPAR) 90 MG tablet Take 90 mg by mouth daily.     dapagliflozin propanediol (FARXIGA) 10 MG TABS tablet Take 10 mg by mouth daily.     hydrOXYzine (ATARAX/VISTARIL) 50 MG tablet Take 50 mg by mouth at bedtime.     lactulose (CHRONULAC) 10 GM/15ML solution  SMARTSIG:Milliliter(s) By Mouth     levothyroxine (SYNTHROID) 75 MCG tablet Take 75 mcg by mouth daily before breakfast.      lidocaine-prilocaine (EMLA) cream Apply 1 application topically daily as needed (port access).     losartan (COZAAR) 25 MG tablet Take 12.5 mg by mouth daily.     midodrine (PROAMATINE) 10 MG tablet Take 1 tablet (10 mg total) by mouth 3 (three) times a week. Monday, Wednesday, Friday with hemodialysis     nitroGLYCERIN (NITROSTAT) 0.4 MG SL tablet Place 1 tablet (0.4 mg total) under the tongue every 5 (five) minutes as needed for chest pain. 30 tablet 0   nortriptyline (PAMELOR) 25 MG capsule Take 25 mg nightly for headaches.     oxyCODONE-acetaminophen (PERCOCET/ROXICET) 5-325 MG tablet Take 1 tablet by mouth 2 (two) times daily as needed for moderate pain.     pantoprazole (PROTONIX) 40 MG tablet Take 40 mg by mouth daily.     rosuvastatin (CRESTOR) 20 MG tablet Take 20 mg by mouth at bedtime.     sevelamer carbonate (RENVELA) 800 MG tablet Take 3,200 mg by mouth 3 (three) times daily with meals.     ranolazine (RANEXA) 1000 MG SR tablet Take 1 tablet (1,000 mg total) by mouth 2 (two) times daily. 60 tablet 2   Current Facility-Administered Medications on File Prior to Visit  Medication Dose Route Frequency Provider Last Rate Last Admin   sodium chloride flush (NS) 0.9 % injection 3 mL  3 mL Intravenous Q12H Neoma Laming A, MD   350 mL at 10/06/19 1212    There are no Patient Instructions on file for this visit. No follow-ups on file.   Kris Hartmann, NP

## 2022-08-19 NOTE — Procedures (Signed)
PROCEDURE SUMMARY:  Successful US guided paracentesis from left lower abdomen.  Yielded 2 L of clear yellow fluid.  No immediate complications.  Pt tolerated well.   Specimen not sent for labs.  EBL < 2 mL  Theresa Duty, NP 08/19/2022 11:34 AM

## 2022-08-19 NOTE — Progress Notes (Signed)
Subjective:    Patient ID: [name], female    DOB: June 03, 1961, 62 y.o.   MRN: [id number] Chief Complaint  Patient presents with   Follow-up    URGENT: LS 1.4.24 fb/jd. HDA + consult. unable to dialyze. referred by singh, taran    The patient returns to the office for follow up regarding a problem with their dialysis access.   The patient notes that she had an incidence last week where she was not able to dialyze at all.  The next dialysis session she was able to dialyze but noted that they pose some significant clots.  The patient denies redness or swelling at the access site. The patient denies fever or chills at home or while on dialysis.  No recent shortening of the patient's walking distance or new symptoms consistent with claudication.  No history of rest pain symptoms. No new ulcers or wounds of the lower extremities have occurred.  The patient denies amaurosis fugax or recent TIA symptoms. There are no recent neurological changes noted. There is no history of DVT, PE or superficial thrombophlebitis. No recent episodes of angina or shortness of breath documented.   Duplex ultrasound of the AV access shows a patent access.  The previously noted stenosis is significantly increased compared to last study.  Flow volume today is 1387 cc/min (previous flow volume was 1524 cc/min)    Review of Systems  All other systems reviewed and are negative.      Objective:   Physical Exam Vitals reviewed.  HENT:     Head: Normocephalic.  Cardiovascular:     Rate and Rhythm: Normal rate.     Pulses:          Radial pulses are 1+ on the right side and 1+ on the left side.  Pulmonary:     Effort: Pulmonary effort is normal.  Skin:    General: Skin is warm and dry.  Neurological:     Mental Status: She is alert and oriented to person, place, and time.  Psychiatric:        Mood and Affect: Mood normal.        Behavior: Behavior normal.        Thought Content: Thought content  normal.        Judgment: Judgment normal.     BP 107/72 (BP Location: Right Arm)   Pulse 82   Resp 18   Ht '5\' 6"'$  (1.676 m)   Wt 162 lb (73.5 kg)   BMI 26.15 kg/m   Past Medical History:  Diagnosis Date   Adult behavior problems    frontal lobe CVA   Anemia    chronic disease   Atrial flutter by electrocardiogram ([place]) 03/22/2014   Cardiomyopathy ([place])    CHF (congestive heart failure) ([place])    Chicken pox    Coronary artery disease    Delayed surgical wound healing 04/12/2014   Overview:  Left leg   GERD (gastroesophageal reflux disease)    Heart failure (HCC)    Hepatitis    history of hep c   History of CVA (cerebrovascular accident) 02/21/2014   Hyperlipidemia    Hypertension    Hypotension 02/24/2014   Myocardial infarction [facility])    Obesity    Paroxysmal atrial fibrillation ([place])    Peripheral vascular disease ([place])    Renal failure    Renal insufficiency    Stroke [facility]) 2011   Superficial incisional surgical site infection 03/14/2014    Social History  Socioeconomic History   Marital status: Single    Spouse name: Not on file   Number of children: Not on file   Years of education: Not on file   Highest education level: Not on file  Occupational History   Not on file  Tobacco Use   Smoking status: Some Days    Packs/day: 0.25    Years: 20.00    Total pack years: 5.00    Types: Cigarettes   Smokeless tobacco: Never   Tobacco comments:    smokes two cigarettes a day  Vaping Use   Vaping Use: Never used  Substance and Sexual Activity   Alcohol use: No   Drug use: No   Sexual activity: Not Currently    Birth control/protection: Post-menopausal  Other Topics Concern   Not on file  Social History Narrative   Not on file   Social Determinants of Health   Financial Resource Strain: Not on file  Food Insecurity: Not on file  Transportation Needs: Not on file  Physical Activity: Not on file  Stress: Not on file  Social Connections: Not on  file  Intimate Partner Violence: Not on file    Past Surgical History:  Procedure Laterality Date   A/V FISTULAGRAM Left 03/30/2019   Procedure: A/V FISTULAGRAM;  Surgeon: Algernon Huxley, MD;  Location: [place] CV LAB;  Service: Cardiovascular;  Laterality: Left;   A/V FISTULAGRAM Left 10/06/2019   Procedure: A/V FISTULAGRAM;  Surgeon: Algernon Huxley, MD;  Location: [place] CV LAB;  Service: Cardiovascular;  Laterality: Left;   A/V FISTULAGRAM Left 05/19/2021   Procedure: A/V FISTULAGRAM;  Surgeon: Algernon Huxley, MD;  Location: [place] CV LAB;  Service: Cardiovascular;  Laterality: Left;   A/V FISTULAGRAM Left 01/29/2022   Procedure: A/V Fistulagram;  Surgeon: Algernon Huxley, MD;  Location: [place] CV LAB;  Service: Cardiovascular;  Laterality: Left;   A/V FISTULAGRAM Left 04/30/2022   Procedure: A/V Fistulagram;  Surgeon: Algernon Huxley, MD;  Location: [place] CV LAB;  Service: Cardiovascular;  Laterality: Left;   COLONOSCOPY     COLONOSCOPY WITH PROPOFOL N/A 04/22/2021   Procedure: COLONOSCOPY WITH PROPOFOL;  Surgeon: Lesly Rubenstein, MD;  Location: [facility] ENDOSCOPY;  Service: Endoscopy;  Laterality: N/A;   CORONARY ANGIOPLASTY WITH STENT PLACEMENT  2013   CORONARY ARTERY BYPASS GRAFT     DIALYSIS FISTULA CREATION     ESOPHAGOGASTRODUODENOSCOPY     ESOPHAGOGASTRODUODENOSCOPY N/A 04/22/2021   Procedure: ESOPHAGOGASTRODUODENOSCOPY (EGD);  Surgeon: Lesly Rubenstein, MD;  Location: [facility] ENDOSCOPY;  Service: Endoscopy;  Laterality: N/A;   FLEXIBLE SIGMOIDOSCOPY N/A 02/23/2019   Procedure: FLEXIBLE SIGMOIDOSCOPY;  Surgeon: Lollie Sails, MD;  Location: [facility] ENDOSCOPY;  Service: Endoscopy;  Laterality: N/A;   LEFT HEART CATH AND CORS/GRAFTS ANGIOGRAPHY N/A 08/08/2018   Procedure: LEFT HEART CATH AND CORS/GRAFTS ANGIOGRAPHY;  Surgeon: Dionisio David, MD;  Location: [place] CV LAB;  Service: Cardiovascular;  Laterality: N/A;   LEFT HEART CATH AND CORS/GRAFTS  ANGIOGRAPHY N/A 01/30/2019   Procedure: LEFT HEART CATH AND CORS/GRAFTS ANGIOGRAPHY;  Surgeon: Dionisio David, MD;  Location: [place] CV LAB;  Service: Cardiovascular;  Laterality: N/A;   percutaneous insertion intra aortic balloon right cath     ultrasound guided pericardiocentesis      Family History  Problem Relation Age of Onset   Hypertension Mother    Ovarian cancer Mother    Diabetes type II Mother    Hypertension Father  Kidney disease Father    Hypertension Sister    Diabetes type II Maternal Grandmother    Breast cancer Maternal Grandmother    Breast cancer Maternal Aunt    Hypertension Other    Cancer Other    Renal Disease Other     Allergies  Allergen Reactions   Morphine And Related Hives   Levaquin [Levofloxacin] Itching    Severe itching; prickly sensation   Other    Latex Rash   Tape Rash       Latest Ref Rng & Units 02/16/2022   11:55 PM 02/16/2022    6:42 PM 01/30/2022    9:56 AM  CBC  WBC 4.0 - 10.5 K/uL 6.8  6.5  3.8   Hemoglobin 12.0 - 15.0 g/dL 11.0  12.6  10.1   Hematocrit 36.0 - 46.0 % 34.5  39.9  32.8   Platelets 150 - 400 K/uL 140  146  127       CMP     Component Value Date/Time   NA 138 02/16/2022 2355   NA 139 07/09/2014 0503   K 5.2 (H) 02/16/2022 2355   K 4.7 07/11/2014 0958   CL 102 02/16/2022 2355   CL 98 07/09/2014 0503   CO2 22 02/16/2022 2355   CO2 30 07/09/2014 0503   GLUCOSE 70 02/16/2022 2355   GLUCOSE 107 (H) 07/09/2014 0503   BUN 43 (H) 02/16/2022 2355   BUN 39 (H) 07/09/2014 0503   CREATININE 8.45 (H) 02/16/2022 2355   CREATININE 5.53 (H) 07/09/2014 0503   CALCIUM 8.1 (L) 02/16/2022 2355   CALCIUM 8.3 (L) 07/09/2014 0503   PROT 6.7 02/16/2022 2215   PROT 8.4 (H) 07/07/2014 2207   ALBUMIN 3.5 02/16/2022 2355   ALBUMIN 3.1 (L) 07/07/2014 2207   AST 21 02/16/2022 2215   AST 76 (H) 07/07/2014 2207   ALT 10 02/16/2022 2215   ALT 41 07/07/2014 2207   ALKPHOS 63 02/16/2022 2215   ALKPHOS 107 07/07/2014  2207   BILITOT 1.0 02/16/2022 2215   BILITOT 0.7 07/07/2014 2207   GFRNONAA 5 (L) 02/16/2022 2355   GFRNONAA 9 (L) 07/09/2014 0503   GFRNONAA 9 (L) 02/11/2014 0300   GFRAA 6 (L) 02/06/2020 0708   GFRAA 10 (L) 07/09/2014 0503   GFRAA 10 (L) 02/11/2014 0300     No results found.     Assessment & Plan:   1. ESRD (end stage renal disease) ([place]) Recommend:  The patient is experiencing increasing problems with their dialysis access.  Patient should have a fistulagram with the intention for intervention.  The intention for intervention is to restore appropriate flow and prevent thrombosis and possible loss of the access.  As well as improve the quality of dialysis therapy.  The risks, benefits and alternative therapies were reviewed in detail with the patient.  All questions were answered.  The patient agrees to proceed with angio/intervention.    The patient will follow up with me in the office after the procedure.   2. Primary hypertension Continue antihypertensive medications as already ordered, these medications have been reviewed and there are no changes at this time.  3. Hyperlipidemia, unspecified hyperlipidemia type Continue statin as ordered and reviewed, no changes at this time   Current Outpatient Medications on File Prior to Visit  Medication Sig Dispense Refill   albuterol (VENTOLIN HFA) 108 (90 Base) MCG/ACT inhaler Inhale into the lungs.     amiodarone (PACERONE) 200 MG tablet Take 200 mg by mouth daily.  apixaban (ELIQUIS) 5 MG TABS tablet Take 5 mg by mouth 2 (two) times daily.     carvedilol (COREG) 6.25 MG tablet Take 0.5 tablets (3.125 mg total) by mouth 2 (two) times daily.     cinacalcet (SENSIPAR) 90 MG tablet Take 90 mg by mouth daily.     dapagliflozin propanediol (FARXIGA) 10 MG TABS tablet Take 10 mg by mouth daily.     hydrOXYzine (ATARAX/VISTARIL) 50 MG tablet Take 50 mg by mouth at bedtime.     lactulose (CHRONULAC) 10 GM/15ML solution  SMARTSIG:Milliliter(s) By Mouth     levothyroxine (SYNTHROID) 75 MCG tablet Take 75 mcg by mouth daily before breakfast.      lidocaine-prilocaine (EMLA) cream Apply 1 application topically daily as needed (port access).     losartan (COZAAR) 25 MG tablet Take 12.5 mg by mouth daily.     midodrine (PROAMATINE) 10 MG tablet Take 1 tablet (10 mg total) by mouth 3 (three) times a week. Monday, Wednesday, Friday with hemodialysis     nitroGLYCERIN (NITROSTAT) 0.4 MG SL tablet Place 1 tablet (0.4 mg total) under the tongue every 5 (five) minutes as needed for chest pain. 30 tablet 0   nortriptyline (PAMELOR) 25 MG capsule Take 25 mg nightly for headaches.     oxyCODONE-acetaminophen (PERCOCET/ROXICET) 5-325 MG tablet Take 1 tablet by mouth 2 (two) times daily as needed for moderate pain.     pantoprazole (PROTONIX) 40 MG tablet Take 40 mg by mouth daily.     rosuvastatin (CRESTOR) 20 MG tablet Take 20 mg by mouth at bedtime.     sevelamer carbonate (RENVELA) 800 MG tablet Take 3,200 mg by mouth 3 (three) times daily with meals.     ranolazine (RANEXA) 1000 MG SR tablet Take 1 tablet (1,000 mg total) by mouth 2 (two) times daily. 60 tablet 2   Current Facility-Administered Medications on File Prior to Visit  Medication Dose Route Frequency Provider Last Rate Last Admin   sodium chloride flush (NS) 0.9 % injection 3 mL  3 mL Intravenous Q12H Neoma Laming A, MD   350 mL at 10/06/19 1212    There are no Patient Instructions on file for this visit. No follow-ups on file.   Kris Hartmann, NP

## 2022-08-20 ENCOUNTER — Other Ambulatory Visit: Payer: Self-pay

## 2022-08-20 ENCOUNTER — Ambulatory Visit
Admission: RE | Admit: 2022-08-20 | Discharge: 2022-08-20 | Disposition: A | Discharge status: Home / Self Care | Payer: 59 | Attending: Vascular Surgery | Admitting: Vascular Surgery

## 2022-08-20 ENCOUNTER — Ambulatory Visit: Payer: 59 | Admitting: Certified Registered"

## 2022-08-20 ENCOUNTER — Encounter
Admission: RE | Disposition: A | Discharge status: Home / Self Care | Payer: Self-pay | Source: Home / Self Care | Attending: Vascular Surgery

## 2022-08-20 ENCOUNTER — Ambulatory Visit: Payer: 59

## 2022-08-20 ENCOUNTER — Encounter: Payer: Self-pay | Admitting: Vascular Surgery

## 2022-08-20 DIAGNOSIS — J449 Chronic obstructive pulmonary disease, unspecified: Secondary | ICD-10-CM | POA: Diagnosis not present

## 2022-08-20 DIAGNOSIS — I132 Hypertensive heart and chronic kidney disease with heart failure and with stage 5 chronic kidney disease, or end stage renal disease: Secondary | ICD-10-CM | POA: Insufficient documentation

## 2022-08-20 DIAGNOSIS — I2510 Atherosclerotic heart disease of native coronary artery without angina pectoris: Secondary | ICD-10-CM | POA: Insufficient documentation

## 2022-08-20 DIAGNOSIS — E785 Hyperlipidemia, unspecified: Secondary | ICD-10-CM | POA: Diagnosis not present

## 2022-08-20 DIAGNOSIS — T82858A Stenosis of vascular prosthetic devices, implants and grafts, initial encounter: Secondary | ICD-10-CM | POA: Diagnosis not present

## 2022-08-20 DIAGNOSIS — Y841 Kidney dialysis as the cause of abnormal reaction of the patient, or of later complication, without mention of misadventure at the time of the procedure: Secondary | ICD-10-CM | POA: Diagnosis not present

## 2022-08-20 DIAGNOSIS — Z9981 Dependence on supplemental oxygen: Secondary | ICD-10-CM | POA: Insufficient documentation

## 2022-08-20 DIAGNOSIS — N186 End stage renal disease: Secondary | ICD-10-CM | POA: Diagnosis not present

## 2022-08-20 DIAGNOSIS — F17210 Nicotine dependence, cigarettes, uncomplicated: Secondary | ICD-10-CM | POA: Insufficient documentation

## 2022-08-20 DIAGNOSIS — E039 Hypothyroidism, unspecified: Secondary | ICD-10-CM | POA: Diagnosis not present

## 2022-08-20 DIAGNOSIS — Z992 Dependence on renal dialysis: Secondary | ICD-10-CM

## 2022-08-20 DIAGNOSIS — I509 Heart failure, unspecified: Secondary | ICD-10-CM | POA: Insufficient documentation

## 2022-08-20 DIAGNOSIS — I252 Old myocardial infarction: Secondary | ICD-10-CM | POA: Diagnosis not present

## 2022-08-20 HISTORY — PX: A/V FISTULAGRAM: CATH118298

## 2022-08-20 LAB — POTASSIUM (ARMC VASCULAR LAB ONLY): Potassium (ARMC vascular lab): 4.7 mmol/L (ref 3.5–5.1)

## 2022-08-20 SURGERY — A/V FISTULAGRAM
Anesthesia: General | Laterality: Left

## 2022-08-20 MED ORDER — PROPOFOL 500 MG/50ML IV EMUL
INTRAVENOUS | Status: DC | PRN
  Administered 2022-08-20: 50 ug/kg/min via INTRAVENOUS

## 2022-08-20 MED ORDER — MIDAZOLAM HCL 2 MG/ML PO SYRP: 8.0000 mg | ORAL_SOLUTION | Freq: Once | ORAL | Status: DC | PRN

## 2022-08-20 MED ORDER — CEFAZOLIN SODIUM-DEXTROSE 1-4 GM/50ML-% IV SOLN
INTRAVENOUS | Status: AC
  Filled 2022-08-20: qty 50

## 2022-08-20 MED ORDER — LIDOCAINE HCL (CARDIAC) PF 100 MG/5ML IV SOSY
PREFILLED_SYRINGE | INTRAVENOUS | Status: DC | PRN
  Administered 2022-08-20: 50 mg via INTRAVENOUS

## 2022-08-20 MED ORDER — FENTANYL CITRATE (PF) 100 MCG/2ML IJ SOLN
INTRAMUSCULAR | Status: AC
  Filled 2022-08-20: qty 2

## 2022-08-20 MED ORDER — CEFAZOLIN SODIUM-DEXTROSE 1-4 GM/50ML-% IV SOLN
1.0000 g | INTRAVENOUS | Status: AC
  Administered 2022-08-20: 2 g via INTRAVENOUS

## 2022-08-20 MED ORDER — SODIUM CHLORIDE 0.9 % IV SOLN: INTRAVENOUS | Status: DC

## 2022-08-20 MED ORDER — MIDAZOLAM HCL 2 MG/2ML IJ SOLN
INTRAMUSCULAR | Status: AC
  Filled 2022-08-20: qty 2

## 2022-08-20 MED ORDER — PROPOFOL 10 MG/ML IV BOLUS
INTRAVENOUS | Status: DC | PRN
  Administered 2022-08-20: 30 mg via INTRAVENOUS

## 2022-08-20 MED ORDER — PROPOFOL 10 MG/ML IV BOLUS
INTRAVENOUS | Status: AC
  Filled 2022-08-20: qty 20

## 2022-08-20 MED ORDER — METHYLPREDNISOLONE SODIUM SUCC 125 MG IJ SOLR: 125.0000 mg | Freq: Once | INTRAMUSCULAR | Status: DC | PRN

## 2022-08-20 MED ORDER — HEPARIN SODIUM (PORCINE) 1000 UNIT/ML IJ SOLN
INTRAMUSCULAR | Status: DC | PRN
  Administered 2022-08-20: 3000 [IU] via INTRAVENOUS

## 2022-08-20 MED ORDER — FENTANYL CITRATE (PF) 100 MCG/2ML IJ SOLN
INTRAMUSCULAR | Status: DC | PRN
  Administered 2022-08-20: 25 ug via INTRAVENOUS

## 2022-08-20 MED ORDER — FAMOTIDINE 20 MG PO TABS: 40.0000 mg | ORAL_TABLET | Freq: Once | ORAL | Status: DC | PRN

## 2022-08-20 MED ORDER — ONDANSETRON HCL 4 MG/2ML IJ SOLN: 4.0000 mg | Freq: Four times a day (QID) | INTRAMUSCULAR | Status: DC | PRN

## 2022-08-20 MED ORDER — EPHEDRINE SULFATE (PRESSORS) 50 MG/ML IJ SOLN
INTRAMUSCULAR | Status: DC | PRN
  Administered 2022-08-20: 5 mg via INTRAVENOUS

## 2022-08-20 MED ORDER — IODIXANOL 320 MG/ML IV SOLN
INTRAVENOUS | Status: DC | PRN
  Administered 2022-08-20: 15 mL

## 2022-08-20 MED ORDER — HYDROMORPHONE HCL 1 MG/ML IJ SOLN: 1.0000 mg | Freq: Once | INTRAMUSCULAR | Status: DC | PRN

## 2022-08-20 MED ORDER — MIDAZOLAM HCL 2 MG/2ML IJ SOLN
INTRAMUSCULAR | Status: DC | PRN
  Administered 2022-08-20: 2 mg via INTRAVENOUS

## 2022-08-20 MED ORDER — DIPHENHYDRAMINE HCL 50 MG/ML IJ SOLN: 50.0000 mg | Freq: Once | INTRAMUSCULAR | Status: DC | PRN

## 2022-08-20 SURGICAL SUPPLY — 10 items
BALLN LUTONIX AV 9X60X75 (BALLOONS) ×1
BALLOON LUTONIX AV 9X60X75 (BALLOONS) IMPLANT
CANNULA 5F STIFF (CANNULA) IMPLANT
COVER PROBE ULTRASOUND 5X96 (MISCELLANEOUS) IMPLANT
DRAPE BRACHIAL (DRAPES) IMPLANT
GLIDEWIRE ADV .035X180CM (WIRE) IMPLANT
KIT ENCORE 26 ADVANTAGE (KITS) IMPLANT
PACK ANGIOGRAPHY (CUSTOM PROCEDURE TRAY) ×1 IMPLANT
SHEATH BRITE TIP 6FRX5.5 (SHEATH) IMPLANT
SUT MNCRL AB 4-0 PS2 18 (SUTURE) IMPLANT

## 2022-08-20 NOTE — Anesthesia Postprocedure Evaluation (Signed)
Anesthesia Post Note  Patient: [name]  Procedure(s) Performed: A/V Fistulagram (Left)  Patient location during evaluation: Specials Recovery Anesthesia Type: General Level of consciousness: awake and alert Pain management: pain level controlled Vital Signs Assessment: post-procedure vital signs reviewed and stable Respiratory status: spontaneous breathing, nonlabored ventilation and respiratory function stable Cardiovascular status: blood pressure returned to baseline and stable Postop Assessment: no apparent nausea or vomiting Anesthetic complications: no   No notable events documented.   Last Vitals:  Vitals:   08/20/22 1230 08/20/22 1245  BP: 102/62 109/77  Pulse: 75 75  Resp:  15  Temp:    SpO2: 100% 100%    Last Pain:  Vitals:   08/20/22 1245  TempSrc:   PainSc: 0-No pain                 Alphonsus Sias

## 2022-08-20 NOTE — Interval H&P Note (Signed)
History and Physical Interval Note:  08/20/2022 10:55 AM  [name]  has presented today for surgery, with the diagnosis of L Fistula    End Stage Renal.  The various methods of treatment have been discussed with the patient and family. After consideration of risks, benefits and other options for treatment, the patient has consented to  Procedure(s): A/V Fistulagram (Left) as a surgical intervention.  The patient's history has been reviewed, patient examined, no change in status, stable for surgery.  I have reviewed the patient's chart and labs.  Questions were answered to the patient's satisfaction.     Leotis Pain

## 2022-08-20 NOTE — Transfer of Care (Signed)
Immediate Anesthesia Transfer of Care Note  Patient: [name]  Procedure(s) Performed: A/V Fistulagram (Left)  Patient Location: PACU and special recoveries  Anesthesia Type:General  Level of Consciousness: awake, drowsy, and patient cooperative  Airway & Oxygen Therapy: Patient Spontanous Breathing and Patient connected to face mask oxygen  Post-op Assessment: Report given to RN and Post -op Vital signs reviewed and stable  Post vital signs: Reviewed and stable  Last Vitals:  Vitals Value Taken Time  BP 95/62 08/20/22 1208  Temp    Pulse 65 08/20/22 1216  Resp 11 08/20/22 1216  SpO2 100 % 08/20/22 1216  Vitals shown include unvalidated device data.  Last Pain:  Vitals:   08/20/22 1027  TempSrc: Oral  PainSc: 0-No pain         Complications: No notable events documented.

## 2022-08-20 NOTE — Op Note (Signed)
.  [facility]    OPERATIVE NOTE   PROCEDURE: 1.   Left brachial basilic arteriovenous fistula cannulation under ultrasound guidance 2.   Left arm fistulagram including central venogram 3.   Percutaneous transluminal angioplasty of left basilic/axillary junction with 9 mm diameter by 6 cm length Lutonix drug-coated angioplasty balloon  PRE-OPERATIVE DIAGNOSIS: 1. ESRD 2. Poorly functional left brachiobasilic AVF  POST-OPERATIVE DIAGNOSIS: same as above   SURGEON: Leotis Pain, MD  ANESTHESIA: local with MCS  ESTIMATED BLOOD LOSS: 3 cc  FINDING(S): 65 to 70% stenosis of the basilic vein/axillary vein junction.  The remainder of the fistula is widely patent and the central venous circulation was widely patent.  SPECIMEN(S):  None  CONTRAST: 15 cc  FLUORO TIME: 0.6 minutes  ANESTHESIA:  MAC  INDICATIONS: [name] is a 62 y.o. female who presents with malfunctioning left brachiobasilic arteriovenous fistula.  The patient is scheduled for left arm fistulagram.  The patient is aware the risks include but are not limited to: bleeding, infection, thrombosis of the cannulated access, and possible anaphylactic reaction to the contrast.  The patient is aware of the risks of the procedure and elects to proceed forward.  DESCRIPTION: After full informed written consent was obtained, the patient was brought back to the angiography suite and placed supine upon the angiography table.  The patient was connected to monitoring equipment. The left arm was prepped and draped in the standard fashion for a percutaneous access intervention.  Under ultrasound guidance, the left brachiobasilic arteriovenous fistula was cannulated with a micropuncture needle under direct ultrasound guidance where it was patent and a permanent image was performed.  The microwire was advanced into the fistula and the needle was exchanged for the a microsheath.  I then upsized to a 6 Fr Sheath and  imaging was performed.  Hand injections were completed to image the access including the central venous system. This demonstrated 65 to 70% stenosis of the basilic vein/axillary vein junction.  The remainder of the fistula is widely patent and the central venous circulation was widely patent.  Based on the images, this patient will need intervention to this area of stenosis. I then gave the patient 3000 units of intravenous heparin.  I then crossed the stenosis with a Magic Tourqe wire.  Based on the imaging, a 9 mm x 6 cm  Lutonix drug coated angioplasty balloon was selected.  The balloon was centered around the basilic vein and axillary vein junction stenosis and inflated to 8 ATM for 1 minute(s).  On completion imaging, a 20-25% residual stenosis was present.     Based on the completion imaging, no further intervention is necessary.  The wire and balloon were removed from the sheath.  A 4-0 Monocryl purse-string suture was sewn around the sheath.  The sheath was removed while tying down the suture.  A sterile bandage was applied to the puncture site.  COMPLICATIONS: None  CONDITION: Stable   Leotis Pain  08/20/2022 12:19 PM   This note was created with Dragon Medical transcription system. Any errors in dictation are purely unintentional.

## 2022-08-20 NOTE — Anesthesia Procedure Notes (Signed)
Procedure Name: MAC Date/Time: 08/20/2022 11:30 AM  Performed by: Jerrye Noble, CRNAPre-anesthesia Checklist: Patient identified, Emergency Drugs available, Suction available and Patient being monitored Patient Re-evaluated:Patient Re-evaluated prior to induction Oxygen Delivery Method: Simple face mask

## 2022-08-20 NOTE — Anesthesia Preprocedure Evaluation (Addendum)
Anesthesia Evaluation  Patient identified by MRN, date of birth, ID band Patient awake    Reviewed: Allergy & Precautions, NPO status , Patient's Chart, lab work & pertinent test results  Airway Mallampati: III  TM Distance: >3 FB Neck ROM: full    Dental  (+) Chipped   Pulmonary COPD,  oxygen dependent, Current Smoker and Patient abstained from smoking.   Pulmonary exam normal        Cardiovascular hypertension, + angina  + CAD, + Past MI, + Peripheral Vascular Disease and +CHF  Normal cardiovascular exam     Neuro/Psych  PSYCHIATRIC DISORDERS     Dementia CVA    GI/Hepatic Neg liver ROS,GERD  Controlled,,(+)   ascites    , Hepatitis -S/P multiple paracentesis, 4L removed yesterday   Endo/Other  Hypothyroidism    Renal/GU DialysisRenal diseaseDialysis yesterday  negative genitourinary   Musculoskeletal   Abdominal   Peds  Hematology  (+) Blood dyscrasia, anemia   Anesthesia Other Findings Past Medical History: No date: Adult behavior problems     Comment:  frontal lobe CVA No date: Anemia     Comment:  chronic disease 03/22/2014: Atrial flutter by electrocardiogram (HCC) No date: Cardiomyopathy ([place]) No date: CHF (congestive heart failure) (HCC) No date: Chicken pox No date: Coronary artery disease 04/12/2014: Delayed surgical wound healing     Comment:  Overview:  Left leg No date: GERD (gastroesophageal reflux disease) No date: Heart failure ([place]) No date: Hepatitis     Comment:  history of hep c 02/21/2014: History of CVA (cerebrovascular accident) No date: Hyperlipidemia No date: Hypertension 02/24/2014: Hypotension No date: Myocardial infarction ([place]) No date: Obesity No date: Paroxysmal atrial fibrillation (HCC) No date: Peripheral vascular disease ([place]) No date: Renal failure No date: Renal insufficiency 2011: Stroke ([place]) 03/14/2014: Superficial incisional surgical site infection  Past  Surgical History: 03/30/2019: A/V FISTULAGRAM; Left     Comment:  Procedure: A/V FISTULAGRAM;  Surgeon: Algernon Huxley, MD;               Location: [place] CV LAB;  Service: Cardiovascular;              Laterality: Left; 10/06/2019: A/V FISTULAGRAM; Left     Comment:  Procedure: A/V FISTULAGRAM;  Surgeon: Algernon Huxley, MD;               Location: [place] CV LAB;  Service: Cardiovascular;              Laterality: Left; 05/19/2021: A/V FISTULAGRAM; Left     Comment:  Procedure: A/V FISTULAGRAM;  Surgeon: Algernon Huxley, MD;               Location: [place] CV LAB;  Service: Cardiovascular;              Laterality: Left; 01/29/2022: A/V FISTULAGRAM; Left     Comment:  Procedure: A/V Fistulagram;  Surgeon: Algernon Huxley, MD;               Location: [place] CV LAB;  Service: Cardiovascular;              Laterality: Left; 04/30/2022: A/V FISTULAGRAM; Left     Comment:  Procedure: A/V Fistulagram;  Surgeon: Algernon Huxley, MD;               Location: [place] CV LAB;  Service: Cardiovascular;              Laterality: Left; No  date: COLONOSCOPY 04/22/2021: COLONOSCOPY WITH PROPOFOL; N/A     Comment:  Procedure: COLONOSCOPY WITH PROPOFOL;  Surgeon:               Lesly Rubenstein, MD;  Location: [facility] ENDOSCOPY;                Service: Endoscopy;  Laterality: N/A; 2013: CORONARY ANGIOPLASTY WITH STENT PLACEMENT No date: CORONARY ARTERY BYPASS GRAFT No date: DIALYSIS FISTULA CREATION No date: ESOPHAGOGASTRODUODENOSCOPY 04/22/2021: ESOPHAGOGASTRODUODENOSCOPY; N/A     Comment:  Procedure: ESOPHAGOGASTRODUODENOSCOPY (EGD);  Surgeon:               Lesly Rubenstein, MD;  Location: [facility] ENDOSCOPY;                Service: Endoscopy;  Laterality: N/A; 02/23/2019: FLEXIBLE SIGMOIDOSCOPY; N/A     Comment:  Procedure: FLEXIBLE SIGMOIDOSCOPY;  Surgeon: Lollie Sails, MD;  Location: [facility] ENDOSCOPY;  Service:               Endoscopy;  Laterality: N/A; 08/08/2018:  LEFT HEART CATH AND CORS/GRAFTS ANGIOGRAPHY; N/A     Comment:  Procedure: LEFT HEART CATH AND CORS/GRAFTS ANGIOGRAPHY;               Surgeon: Dionisio David, MD;  Location: [place] CV              LAB;  Service: Cardiovascular;  Laterality: N/A; 01/30/2019: LEFT HEART CATH AND CORS/GRAFTS ANGIOGRAPHY; N/A     Comment:  Procedure: LEFT HEART CATH AND CORS/GRAFTS ANGIOGRAPHY;               Surgeon: Dionisio David, MD;  Location: [place] [facility]              [facility];  Service: Cardiovascular;  Laterality: N/A; No date: percutaneous insertion intra aortic balloon right cath No date: ultrasound guided pericardiocentesis  BMI    Body Mass Index: 25.18 kg/m      Reproductive/Obstetrics negative OB ROS                             Anesthesia Physical Anesthesia Plan  ASA: 4  Anesthesia Plan: General   Post-op Pain Management:    Induction: Intravenous  PONV Risk Score and Plan: Propofol infusion and TIVA  Airway Management Planned: Natural Airway and Nasal Cannula  Additional Equipment:   Intra-op Plan:   Post-operative Plan:   Informed Consent: I have reviewed the patients History and Physical, chart, labs and discussed the procedure including the risks, benefits and alternatives for the proposed anesthesia with the patient or authorized representative who has indicated his/her understanding and acceptance.     Dental Advisory Given  Plan Discussed with: Anesthesiologist, CRNA and Surgeon  Anesthesia Plan Comments: (Patient consented for risks of anesthesia including but not limited to:  - adverse reactions to medications - damage to eyes, teeth, lips or other oral mucosa - nerve damage due to positioning  - sore throat or hoarseness - Damage to heart, brain, nerves, lungs, other parts of body or loss of life  Patient voiced understanding.)       Anesthesia Quick Evaluation

## 2022-08-21 DIAGNOSIS — Z992 Dependence on renal dialysis: Secondary | ICD-10-CM | POA: Diagnosis not present

## 2022-08-21 DIAGNOSIS — N2581 Secondary hyperparathyroidism of renal origin: Secondary | ICD-10-CM | POA: Diagnosis not present

## 2022-08-21 DIAGNOSIS — N186 End stage renal disease: Secondary | ICD-10-CM | POA: Diagnosis not present

## 2022-08-24 DIAGNOSIS — I509 Heart failure, unspecified: Secondary | ICD-10-CM | POA: Diagnosis not present

## 2022-08-24 DIAGNOSIS — N186 End stage renal disease: Secondary | ICD-10-CM | POA: Diagnosis not present

## 2022-08-24 DIAGNOSIS — N2581 Secondary hyperparathyroidism of renal origin: Secondary | ICD-10-CM | POA: Diagnosis not present

## 2022-08-24 DIAGNOSIS — Z992 Dependence on renal dialysis: Secondary | ICD-10-CM | POA: Diagnosis not present

## 2022-08-25 ENCOUNTER — Ambulatory Visit: Payer: 59 | Admitting: Cardiovascular Disease

## 2022-08-26 DIAGNOSIS — N2581 Secondary hyperparathyroidism of renal origin: Secondary | ICD-10-CM | POA: Diagnosis not present

## 2022-08-26 DIAGNOSIS — Z992 Dependence on renal dialysis: Secondary | ICD-10-CM | POA: Diagnosis not present

## 2022-08-26 DIAGNOSIS — N186 End stage renal disease: Secondary | ICD-10-CM | POA: Diagnosis not present

## 2022-08-28 DIAGNOSIS — N186 End stage renal disease: Secondary | ICD-10-CM | POA: Diagnosis not present

## 2022-08-28 DIAGNOSIS — N2581 Secondary hyperparathyroidism of renal origin: Secondary | ICD-10-CM | POA: Diagnosis not present

## 2022-08-28 DIAGNOSIS — Z992 Dependence on renal dialysis: Secondary | ICD-10-CM | POA: Diagnosis not present

## 2022-08-31 ENCOUNTER — Other Ambulatory Visit: Payer: Self-pay | Admitting: Internal Medicine

## 2022-08-31 DIAGNOSIS — R188 Other ascites: Secondary | ICD-10-CM

## 2022-08-31 DIAGNOSIS — N186 End stage renal disease: Secondary | ICD-10-CM | POA: Diagnosis not present

## 2022-08-31 DIAGNOSIS — Z992 Dependence on renal dialysis: Secondary | ICD-10-CM | POA: Diagnosis not present

## 2022-08-31 DIAGNOSIS — N2581 Secondary hyperparathyroidism of renal origin: Secondary | ICD-10-CM | POA: Diagnosis not present

## 2022-09-01 ENCOUNTER — Encounter: Payer: Self-pay | Admitting: Cardiovascular Disease

## 2022-09-01 ENCOUNTER — Ambulatory Visit (INDEPENDENT_AMBULATORY_CARE_PROVIDER_SITE_OTHER): Payer: 59 | Admitting: Cardiovascular Disease

## 2022-09-01 VITALS — BP 108/56 | HR 78 | Temp 97.8°F | Ht 66.0 in | Wt 165.8 lb

## 2022-09-01 DIAGNOSIS — E782 Mixed hyperlipidemia: Secondary | ICD-10-CM | POA: Diagnosis not present

## 2022-09-01 DIAGNOSIS — I9589 Other hypotension: Secondary | ICD-10-CM | POA: Diagnosis not present

## 2022-09-01 DIAGNOSIS — I480 Paroxysmal atrial fibrillation: Secondary | ICD-10-CM | POA: Diagnosis not present

## 2022-09-01 DIAGNOSIS — I5020 Unspecified systolic (congestive) heart failure: Secondary | ICD-10-CM

## 2022-09-01 NOTE — Assessment & Plan Note (Signed)
Patient b/p well controlled today. No longer on ace inhibitor or beta blocker due to low b/p.

## 2022-09-01 NOTE — Progress Notes (Signed)
Cardiology Office Note   Date:  09/01/2022   ID:  [name], DOB 1960/12/14, MRN [id number]  PCP:  [name], MD  Cardiologist:  Neoma Laming, MD      History of Present Illness: [name] is a 62 y.o. female who presents for  Chief Complaint  Patient presents with   Follow-up    4 month follow up    Patient in office for routine cardiac exam. Denies chest pain, palpitations. Patient reports shortness of breath today, unable to find chord to her portable O2. Patient scheduled for paracentesis.      Past Medical History:  Diagnosis Date   Adult behavior problems    frontal lobe CVA   Anemia    chronic disease   Atrial flutter by electrocardiogram ([place]) 03/22/2014   Cardiomyopathy ([place])    CHF (congestive heart failure) (HCC)    Chicken pox    Coronary artery disease    Delayed surgical wound healing 04/12/2014   Overview:  Left leg   GERD (gastroesophageal reflux disease)    Heart failure (HCC)    Hepatitis    history of hep c   History of CVA (cerebrovascular accident) 02/21/2014   Hyperlipidemia    Hypertension    Hypotension 02/24/2014   Myocardial infarction [facility])    Obesity    Paroxysmal atrial fibrillation (HCC)    Peripheral vascular disease ([place])    Renal failure    Renal insufficiency    Stroke [facility]) 2011   Superficial incisional surgical site infection 03/14/2014     Past Surgical History:  Procedure Laterality Date   A/V FISTULAGRAM Left 03/30/2019   Procedure: A/V FISTULAGRAM;  Surgeon: Algernon Huxley, MD;  Location: [place] [facility];  Service: Cardiovascular;  Laterality: Left;   A/V FISTULAGRAM Left 10/06/2019   Procedure: A/V FISTULAGRAM;  Surgeon: Algernon Huxley, MD;  Location: [place] [facility];  Service: Cardiovascular;  Laterality: Left;   A/V FISTULAGRAM Left 05/19/2021   Procedure: A/V FISTULAGRAM;  Surgeon: Algernon Huxley, MD;  Location: [place] [facility];  Service: Cardiovascular;  Laterality: Left;   A/V  FISTULAGRAM Left 01/29/2022   Procedure: A/V Fistulagram;  Surgeon: Algernon Huxley, MD;  Location: [place] [facility];  Service: Cardiovascular;  Laterality: Left;   A/V FISTULAGRAM Left 04/30/2022   Procedure: A/V Fistulagram;  Surgeon: Algernon Huxley, MD;  Location: [place] [facility];  Service: Cardiovascular;  Laterality: Left;   A/V FISTULAGRAM Left 08/20/2022   Procedure: A/V Fistulagram;  Surgeon: Algernon Huxley, MD;  Location: [place] [facility];  Service: Cardiovascular;  Laterality: Left;   COLONOSCOPY     COLONOSCOPY WITH PROPOFOL N/A 04/22/2021   Procedure: COLONOSCOPY WITH PROPOFOL;  Surgeon: Lesly Rubenstein, MD;  Location: [facility] ENDOSCOPY;  Service: Endoscopy;  Laterality: N/A;   CORONARY ANGIOPLASTY WITH STENT PLACEMENT  2013   CORONARY ARTERY BYPASS GRAFT     DIALYSIS FISTULA CREATION     ESOPHAGOGASTRODUODENOSCOPY     ESOPHAGOGASTRODUODENOSCOPY N/A 04/22/2021   Procedure: ESOPHAGOGASTRODUODENOSCOPY (EGD);  Surgeon: Lesly Rubenstein, MD;  Location: [facility] ENDOSCOPY;  Service: Endoscopy;  Laterality: N/A;   FLEXIBLE SIGMOIDOSCOPY N/A 02/23/2019   Procedure: FLEXIBLE SIGMOIDOSCOPY;  Surgeon: Lollie Sails, MD;  Location: [facility] ENDOSCOPY;  Service: Endoscopy;  Laterality: N/A;   LEFT HEART CATH AND CORS/GRAFTS ANGIOGRAPHY N/A 08/08/2018   Procedure: LEFT HEART CATH AND CORS/GRAFTS ANGIOGRAPHY;  Surgeon: Dionisio David, MD;  Location: [facility] INVASIVE CV  LAB;  Service: Cardiovascular;  Laterality: N/A;   LEFT HEART CATH AND CORS/GRAFTS ANGIOGRAPHY N/A 01/30/2019   Procedure: LEFT HEART CATH AND CORS/GRAFTS ANGIOGRAPHY;  Surgeon: Dionisio David, MD;  Location: [place] [facility] LAB;  Service: Cardiovascular;  Laterality: N/A;   percutaneous insertion intra aortic balloon right cath     ultrasound guided pericardiocentesis       Current Outpatient Medications  Medication Sig Dispense Refill   albuterol (VENTOLIN HFA) 108 (90 Base) MCG/ACT inhaler Inhale into the lungs.      amiodarone (PACERONE) 200 MG tablet Take 200 mg by mouth daily.     apixaban (ELIQUIS) 5 MG TABS tablet Take 5 mg by mouth 2 (two) times daily.     hydrOXYzine (ATARAX/VISTARIL) 50 MG tablet Take 50 mg by mouth at bedtime.     lactulose (CHRONULAC) 10 GM/15ML solution SMARTSIG:Milliliter(s) By Mouth     levothyroxine (SYNTHROID) 75 MCG tablet Take 75 mcg by mouth daily before breakfast.      lidocaine-prilocaine (EMLA) cream Apply 1 application topically daily as needed (port access).     midodrine (PROAMATINE) 10 MG tablet Take 1 tablet (10 mg total) by mouth 3 (three) times a week. Monday, Wednesday, Friday with hemodialysis     oxyCODONE-acetaminophen (PERCOCET/ROXICET) 5-325 MG tablet Take 1 tablet by mouth 2 (two) times daily as needed for moderate pain.     pantoprazole (PROTONIX) 40 MG tablet Take 40 mg by mouth daily.     rosuvastatin (CRESTOR) 20 MG tablet Take 20 mg by mouth at bedtime.     sevelamer carbonate (RENVELA) 800 MG tablet Take 3,200 mg by mouth 3 (three) times daily with meals.     No current facility-administered medications for this visit.   Facility-Administered Medications Ordered in Other Visits  Medication Dose Route Frequency Provider Last Rate Last Admin   sodium chloride flush (NS) 0.9 % injection 3 mL  3 mL Intravenous Q12H Neoma Laming A, MD   350 mL at 10/06/19 1212    Allergies:   Morphine and related, Levaquin [levofloxacin], Other, Latex, and Tape    Social History:   reports that she has been smoking cigarettes. She has a 5.00 pack-year smoking history. She has never used smokeless tobacco. She reports that she does not drink alcohol and does not use drugs.   Family History:  family history includes Breast cancer in her maternal aunt and maternal grandmother; Cancer in an other family member; Diabetes type II in her maternal grandmother and mother; Hypertension in her father, mother, sister, and another family member; Kidney disease in her father;  Ovarian cancer in her mother; Renal Disease in an other family member.    ROS:     Review of Systems  Constitutional: Negative.   HENT: Negative.    Eyes: Negative.   Respiratory: Negative.    Cardiovascular: Negative.   Gastrointestinal: Negative.   Genitourinary: Negative.   Musculoskeletal: Negative.   Skin: Negative.   Neurological: Negative.   Endo/Heme/Allergies: Negative.   Psychiatric/Behavioral: Negative.    All other systems reviewed and are negative.   All other systems are reviewed and negative.   PHYSICAL EXAM: VS:  BP (!) 108/56   Pulse 78   Temp 97.8 F (36.6 C) (Tympanic)   Ht 5\' 6"  (1.676 m)   Wt 165 lb 12.8 oz (75.2 kg)   SpO2 (!) 79%   BMI 26.76 kg/m  , BMI Body mass index is 26.76 kg/m. Last weight:  Wt Readings from Last  3 Encounters:  09/01/22 165 lb 12.8 oz (75.2 kg)  08/20/22 156 lb (70.8 kg)  08/12/22 162 lb (73.5 kg)   Physical Exam Constitutional:      Appearance: Normal appearance.  Cardiovascular:     Rate and Rhythm: Normal rate and regular rhythm.     Heart sounds: Normal heart sounds.  Pulmonary:     Effort: Pulmonary effort is normal.     Breath sounds: Normal breath sounds.  Musculoskeletal:     Right lower leg: No edema.     Left lower leg: No edema.  Neurological:     Mental Status: She is alert.     EKG:  none today  Recent Labs: 01/09/2022: B Natriuretic Peptide 2,400.6 01/30/2022: Magnesium 2.2 02/16/2022: ALT 10; BUN 43; Creatinine, Ser 8.45; Hemoglobin 11.0; Platelets 140; Potassium 5.2; Sodium 138    Lipid Panel    Component Value Date/Time   CHOL 140 01/18/2019 1358   CHOL 79 06/14/2014 0404   TRIG 148 01/18/2019 1358   TRIG 85 06/14/2014 0404   HDL 43 01/18/2019 1358   HDL 35 (L) 06/14/2014 0404   CHOLHDL 3.3 01/18/2019 1358   VLDL 30 01/18/2019 1358   VLDL 17 06/14/2014 0404   LDLCALC 67 01/18/2019 1358   [place] 27 06/14/2014 0404      Other studies Reviewed: Patient: [id number] - Rejeana Brock.  Nevin DOB:  June 24, 1960  Date:  01/06/2022 11:00 Provider: Neoma Laming MD Encounter: ECHO   Page 2 REASON FOR VISIT  Visit for: Echocardiogram/I50.9  Sex:   Female  wt=157    lbs.  BP=110/64  Height= 66   inches.   TESTS  Imaging: Echocardiogram:  An echocardiogram in (2-d) mode was performed and in Doppler mode with color flow velocity mapping was performed. The aortic valve cusps are abnormal 1.8  cm, flow velocity 1.21   m/s, and systolic calculated mean flow gradient 4   mmHg. Mitral valve diastolic peak flow velocity E .912   m/s and E/A ratio 2.1. Aortic root diameter 3.1  cm. The LVOT internal diameter 2.6 cm and flow velocity was abnormal .123456   m/s. LV systolic dimension [id number]   cm, diastolic [id number] cm, posterior wall thickness 1.28  cm, fractional shortening 21.8 %, and EF 43.4 %. IVS thickness 1.11 cm. LA dimension 4.8 cm. Tricuspid Valve has Moderate-Severe Regurgitation. Mitral Valve has Moderate Regurgitation. Pulmonic Valve has Trace Regurgitation.     ASSESSMENT  Technically adequate study.  Moderate LV dysfunction  Unable to determine LV diastolic dysfunction due to atrial fibrillation.  Normal right ventricular systolic function.  Normal right ventricular diastolic function.  Moderate diffuse hypokinesis  Trace pulmonary regurgitation.  Moderate to Severe tricuspid regurgitation.  Moderate pulmonary hypertension.  Moderate mitral regurgitation.  No pericardial effusion.  Moderately dilated Left and Right atrium  Severely dilated Left and Right ventricle  Severe LVH.   THERAPY   Referring physician: Dionisio David  Sonographer: Neoma Laming.   Neoma Laming MD  Electronically signed by: Neoma Laming     Date: 01/08/2022 08:40  Patient: [name] DOB:  [birth year]  Date:  05/15/2021 08:00 Provider: Neoma Laming MD Encounter: NUCLEAR STRESS TEST   Page 1 TESTS    [facility] [address] [place], [place] [address] [phone] STUDY:  Gated Stress / Rest Myocardial Perfusion Imaging Tomographic (SPECT) Including attenuation correction Wall Motion, Left Ventricular Ejection Fraction By Gated Technique.Persantine Stress Test. SEX: Female  WEIGHT: 160 lbs  HEIGHT: 66 in    ARMS UP: YES/NO                                                                                                                                                                                REFERRING PHYSICIAN: Dr.Beza Steppe Humphrey Rolls                                                                                                                                                                                                                       INDICATION FOR STUDY: SOB  TECHNIQUE:  Approximately 20 minutes following the intravenous administration of 10.2 mCi of Tc-76m Sestamibi after stress testing in a reclined supine position with arms above their head if able to do so, gated SPECT imaging of the heart was performed. After about a 2hr break, the patient was injected intravenously with 30.3 mCi of Tc-95m Sestamibi.  Approximately 45 minutes later in the same position as stress imaging SPECT rest imaging of the heart was performed.  STRESS BY:  Neoma Laming, MD PROTOCOL:  Persantine  DOSE ADMIN: 8.3 cc      ROUTE OF ADMINISTRATION: IV                                                                            MAX PRED HR: 162                     85%: 138               75%: 122                                                                                                                   RESTING BP: 106/62  RESTING HR: 63  PEAK BP: 102/58   PEAK HR:  94                                                                   EXERCISE  DURATION:    4 min injection                                            REASON FOR TEST TERMINATION:    Protocol end  SYMPTOMS:   None                                                                                                                                                                                                          EKG RESULTS: NSR. 63/min. RBBB. No significant ST changes with persantine.                                                              IMAGE QUALITY: Fair  PERFUSION/WALL MOTION FINDINGS: EF = 18%. Dilated LV. Large severe fixed basal anterior, inferolateral, and anterolateral, mid anterior, inferolateral, and anterolateral, and apical anterior wall defects, global hypokinesis.                                                                           IMPRESSION: Infarction in the LAD/LCX territories with severe LV dysfunction, dilated LV.                                                                                                                                                                                                                                                                                         Neoma Laming, MD Stress Interpreting Physician / Nuclear Interpreting Physician        Neoma Laming MD  Electronically signed by: Neoma Laming     Date: 05/20/2021 09:59  Patient: [id number] - Rejeana Brock. Olshefski DOB:  1961/04/06  Date:  04/05/2018 10:00 Provider: Neoma Laming MD Encounter: ALL ANGIOGRAMS (CTA BRAIN, CAROTIDS,  RENAL ARTERIES, PE)   Page 2 REASON FOR VISIT  Referred by Dr.Iram Lundberg Humphrey Rolls.   TESTS  Imaging: Computed Tomographic Angiography:  Cardiac multidetector CT was performed paying particular attention to the coronary arteries for the diagnosis of: CAD. Diagnostic Drugs:  Administered iohexol (Omnipaque) through an antecubital vein and images from the examination were analyzed for the presence and extent of coronary artery disease, using 3D image processing software. 100 mL of non-ionic contrast (Omnipaque) was used.   ASSESSMENT   The LIMA origin was visualized To LAD  The SVG origin was visualized To PDA and OM     TEST CONCLUSIONS  Quality of study: Fair.  1-Calcium score: 14215.4  2-Right dominant system.  3-LIMA to LAD, SVG to OM and PDA are patent. Calcium score is very high.   Neoma Laming MD  Electronically signed by: Earlyne Iba  [facility]     Date: 04/11/2018 09:58   ASSESSMENT AND PLAN:    ICD-10-CM   1. Systolic congestive heart failure, unspecified HF chronicity (HCC)  I50.20 PCV ECHOCARDIOGRAM COMPLETE    2. Paroxysmal atrial fibrillation (HCC)  I48.0     3. Chronic hypotension  I95.89     4. Mixed hyperlipidemia  E78.2        Problem List Items Addressed This Visit       Cardiovascular and Mediastinum   Congestive heart failure (CHF) ([place]) - Primary (Chronic)    Previous echo 12/2021 EF 43%. Will repeat echo to check heart function. Not taking ace inhibitor or beta blocker due to hypotension.       Relevant Orders   PCV ECHOCARDIOGRAM COMPLETE   Chronic hypotension (Chronic)    Patient b/p well controlled today. No longer on ace inhibitor or beta blocker due to low b/p.      Paroxysmal atrial fibrillation (HCC)    On amiodarone and Eliquis, continue.         Other   Hyperlipidemia    Disposition:   Return in about 3 months (around 12/02/2022) for echo prior.    Total time spent: 30 minutes  Signed,  Neoma Laming, MD  09/01/2022 9:51 AM    [place]

## 2022-09-01 NOTE — Assessment & Plan Note (Signed)
On amiodarone and Eliquis, continue.

## 2022-09-01 NOTE — Assessment & Plan Note (Signed)
>>  ASSESSMENT AND PLAN FOR CONGESTIVE HEART FAILURE (CHF) (HCC) WRITTEN ON 09/01/2022  9:50 AM BY Welton Flakes, SHAUKAT A, MD  Previous echo 12/2021 EF 43%. Will repeat echo to check heart function. Not taking ace inhibitor or beta blocker due to hypotension.

## 2022-09-01 NOTE — Assessment & Plan Note (Signed)
Previous echo 12/2021 EF 43%. Will repeat echo to check heart function. Not taking ace inhibitor or beta blocker due to hypotension.

## 2022-09-02 DIAGNOSIS — Z992 Dependence on renal dialysis: Secondary | ICD-10-CM | POA: Diagnosis not present

## 2022-09-02 DIAGNOSIS — N186 End stage renal disease: Secondary | ICD-10-CM | POA: Diagnosis not present

## 2022-09-02 DIAGNOSIS — N2581 Secondary hyperparathyroidism of renal origin: Secondary | ICD-10-CM | POA: Diagnosis not present

## 2022-09-03 ENCOUNTER — Ambulatory Visit
Admission: RE | Admit: 2022-09-03 | Discharge: 2022-09-03 | Disposition: A | Discharge status: Home / Self Care | Payer: 59 | Source: Ambulatory Visit | Attending: Internal Medicine | Admitting: Internal Medicine

## 2022-09-03 DIAGNOSIS — R188 Other ascites: Secondary | ICD-10-CM | POA: Insufficient documentation

## 2022-09-03 DIAGNOSIS — N186 End stage renal disease: Secondary | ICD-10-CM | POA: Diagnosis not present

## 2022-09-03 MED ORDER — LIDOCAINE HCL (PF) 1 % IJ SOLN
10.0000 mL | Freq: Once | INTRAMUSCULAR | Status: AC
  Administered 2022-09-03: 10 mL via INTRADERMAL

## 2022-09-03 NOTE — Procedures (Signed)
PROCEDURE SUMMARY:  Successful US guided paracentesis from RUQ. Yielded 3 L of clear yellow fluid.  No immediate complications.  Pt tolerated well.   Specimen was not sent for labs.  EBL < 88mL  Rockney Ghee 09/03/2022 12:09 PM

## 2022-09-04 DIAGNOSIS — Z992 Dependence on renal dialysis: Secondary | ICD-10-CM | POA: Diagnosis not present

## 2022-09-04 DIAGNOSIS — N2581 Secondary hyperparathyroidism of renal origin: Secondary | ICD-10-CM | POA: Diagnosis not present

## 2022-09-04 DIAGNOSIS — N186 End stage renal disease: Secondary | ICD-10-CM | POA: Diagnosis not present

## 2022-09-07 DIAGNOSIS — N2581 Secondary hyperparathyroidism of renal origin: Secondary | ICD-10-CM | POA: Diagnosis not present

## 2022-09-07 DIAGNOSIS — Z992 Dependence on renal dialysis: Secondary | ICD-10-CM | POA: Diagnosis not present

## 2022-09-07 DIAGNOSIS — N186 End stage renal disease: Secondary | ICD-10-CM | POA: Diagnosis not present

## 2022-09-09 DIAGNOSIS — N2581 Secondary hyperparathyroidism of renal origin: Secondary | ICD-10-CM | POA: Diagnosis not present

## 2022-09-09 DIAGNOSIS — N186 End stage renal disease: Secondary | ICD-10-CM | POA: Diagnosis not present

## 2022-09-09 DIAGNOSIS — Z992 Dependence on renal dialysis: Secondary | ICD-10-CM | POA: Diagnosis not present

## 2022-09-10 ENCOUNTER — Ambulatory Visit (INDEPENDENT_AMBULATORY_CARE_PROVIDER_SITE_OTHER): Payer: Self-pay

## 2022-09-10 DIAGNOSIS — I371 Nonrheumatic pulmonary valve insufficiency: Secondary | ICD-10-CM | POA: Diagnosis not present

## 2022-09-10 DIAGNOSIS — I340 Nonrheumatic mitral (valve) insufficiency: Secondary | ICD-10-CM | POA: Diagnosis not present

## 2022-09-10 DIAGNOSIS — I361 Nonrheumatic tricuspid (valve) insufficiency: Secondary | ICD-10-CM

## 2022-09-10 DIAGNOSIS — I5020 Unspecified systolic (congestive) heart failure: Secondary | ICD-10-CM

## 2022-09-11 DIAGNOSIS — N186 End stage renal disease: Secondary | ICD-10-CM | POA: Diagnosis not present

## 2022-09-11 DIAGNOSIS — Z992 Dependence on renal dialysis: Secondary | ICD-10-CM | POA: Diagnosis not present

## 2022-09-11 DIAGNOSIS — N2581 Secondary hyperparathyroidism of renal origin: Secondary | ICD-10-CM | POA: Diagnosis not present

## 2022-09-13 DIAGNOSIS — Z992 Dependence on renal dialysis: Secondary | ICD-10-CM | POA: Diagnosis not present

## 2022-09-13 DIAGNOSIS — N186 End stage renal disease: Secondary | ICD-10-CM | POA: Diagnosis not present

## 2022-09-14 DIAGNOSIS — Z992 Dependence on renal dialysis: Secondary | ICD-10-CM | POA: Diagnosis not present

## 2022-09-14 DIAGNOSIS — N186 End stage renal disease: Secondary | ICD-10-CM | POA: Diagnosis not present

## 2022-09-14 DIAGNOSIS — N2581 Secondary hyperparathyroidism of renal origin: Secondary | ICD-10-CM | POA: Diagnosis not present

## 2022-09-14 IMAGING — CR DG CHEST 2V
1 series · 2 of 2 positions shown · non-contrast
Comparison: 02/06/2020

CLINICAL DATA: Chest pain

EXAM:
CHEST - 2 VIEW

[Series 1: dg chest 2 view · 0.14mm/px · 2 of 2 slices shown]
[im 1/2]
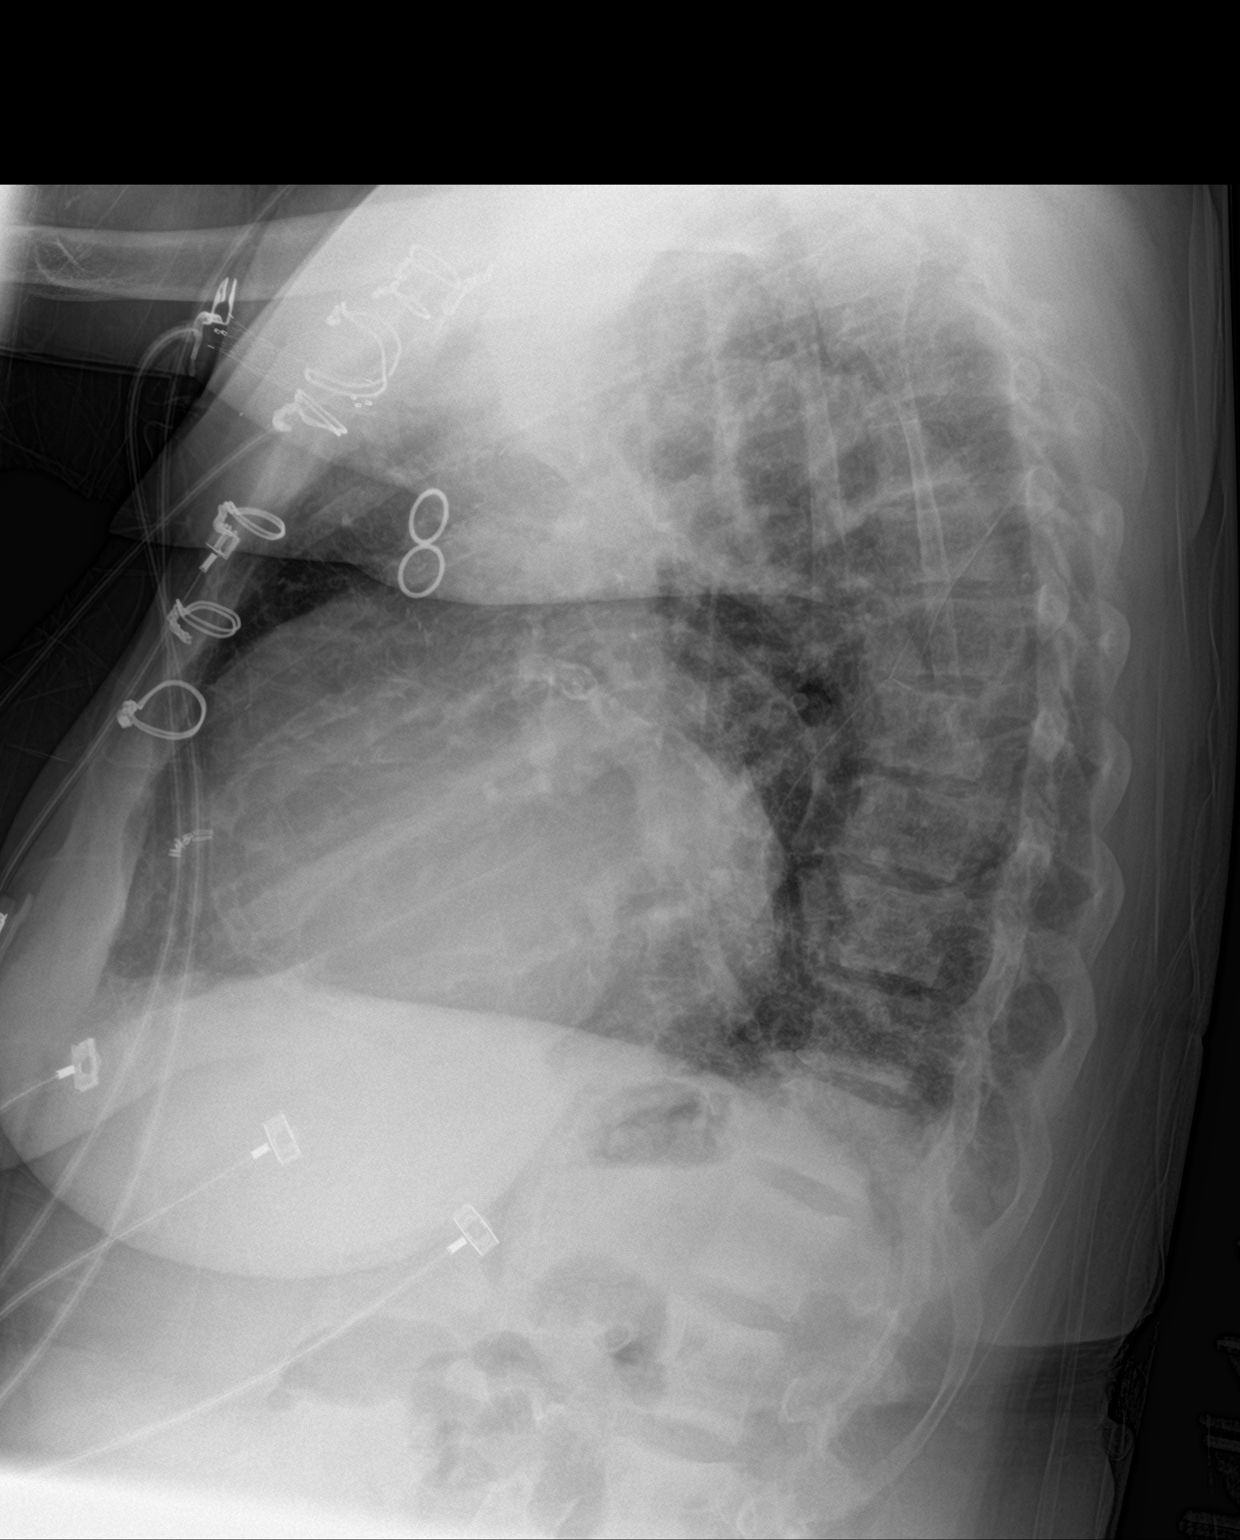
[im 2/2]
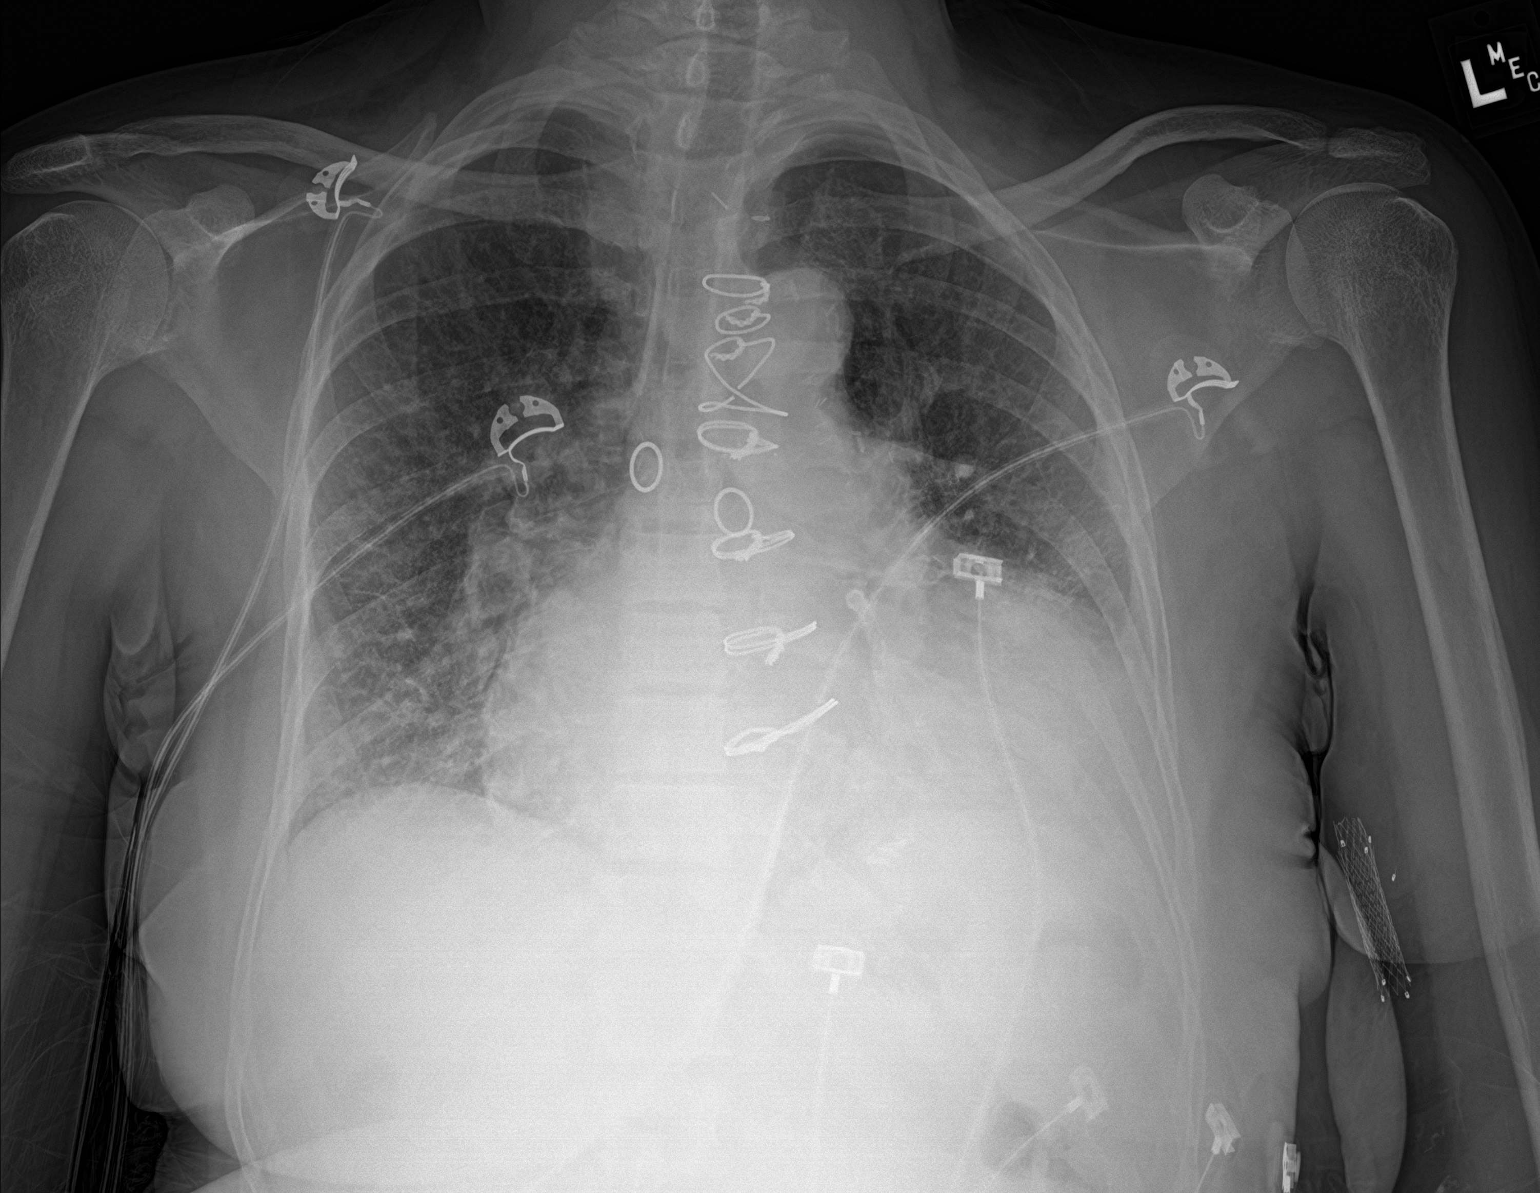

[2 of 2 positions shown; findings below may reference images not displayed]

FINDINGS: Diffuse interstitial opacity with Kerley lines. No effusion or
pneumothorax. Chronic marked cardiomegaly. There is aortic and
coronary atherosclerosis. Prior CABG. Stent in the left arm which is
likely dialysis related. Sclerotic appearance of endplates
attributed to renal osteodystrophy.
IMPRESSION: 1. Cardiomegaly and pulmonary edema.
2. Renal osteodystrophy.

## 2022-09-16 DIAGNOSIS — N186 End stage renal disease: Secondary | ICD-10-CM | POA: Diagnosis not present

## 2022-09-16 DIAGNOSIS — Z992 Dependence on renal dialysis: Secondary | ICD-10-CM | POA: Diagnosis not present

## 2022-09-16 DIAGNOSIS — N2581 Secondary hyperparathyroidism of renal origin: Secondary | ICD-10-CM | POA: Diagnosis not present

## 2022-09-17 ENCOUNTER — Ambulatory Visit
Admission: RE | Admit: 2022-09-17 | Discharge: 2022-09-17 | Disposition: A | Discharge status: Home / Self Care | Payer: 59 | Attending: Family | Admitting: Family

## 2022-09-17 ENCOUNTER — Ambulatory Visit
Admission: RE | Admit: 2022-09-17 | Discharge: 2022-09-17 | Disposition: A | Discharge status: Home / Self Care | Payer: 59 | Source: Ambulatory Visit | Attending: Family | Admitting: Family

## 2022-09-17 ENCOUNTER — Encounter: Payer: Self-pay | Admitting: Family

## 2022-09-17 ENCOUNTER — Ambulatory Visit (INDEPENDENT_AMBULATORY_CARE_PROVIDER_SITE_OTHER): Payer: 59 | Admitting: Family

## 2022-09-17 VITALS — BP 102/60 | Ht 66.0 in | Wt 164.0 lb

## 2022-09-17 DIAGNOSIS — M79645 Pain in left finger(s): Secondary | ICD-10-CM | POA: Diagnosis not present

## 2022-09-17 DIAGNOSIS — S62617A Displaced fracture of proximal phalanx of left little finger, initial encounter for closed fracture: Secondary | ICD-10-CM | POA: Diagnosis not present

## 2022-09-17 MED ORDER — OXYCODONE-ACETAMINOPHEN 5-325 MG PO TABS: 1.0000 | ORAL_TABLET | Freq: Four times a day (QID) | ORAL | 0 refills | Status: DC | PRN

## 2022-09-18 DIAGNOSIS — N2581 Secondary hyperparathyroidism of renal origin: Secondary | ICD-10-CM | POA: Diagnosis not present

## 2022-09-18 DIAGNOSIS — Z992 Dependence on renal dialysis: Secondary | ICD-10-CM | POA: Diagnosis not present

## 2022-09-18 DIAGNOSIS — N186 End stage renal disease: Secondary | ICD-10-CM | POA: Diagnosis not present

## 2022-09-19 ENCOUNTER — Encounter: Payer: Self-pay | Admitting: Family

## 2022-09-19 NOTE — Progress Notes (Signed)
   Acute Office Visit  Subjective:     Patient ID: [name], female    DOB: [birth year], 62 y.o.   MRN: [id number]  Chief Complaint  Patient presents with   Acute Visit    Hurt left pinkie    Patient here with pain in her left pinky.  She hurt it when she fell last week at her brother's house, but didn't notice it immediately because she had more pain in her knee.  Since then she has been having significant pain and swelling in the finger, and is having trouble bending the digit.      Review of Systems  Musculoskeletal:  Positive for joint pain.  All other systems reviewed and are negative.       Objective:    BP 102/60   Ht 5\' 6"  (1.676 m)   Wt 164 lb (74.4 kg)   BMI 26.47 kg/m   Physical Exam Vitals and nursing note reviewed.  Constitutional:      Appearance: Normal appearance. She is normal weight.  HENT:     Head: Normocephalic.  Eyes:     Pupils: Pupils are equal, round, and reactive to light.  Cardiovascular:     Rate and Rhythm: Normal rate.  Pulmonary:     Effort: Pulmonary effort is normal.  Musculoskeletal:     Right hand: Normal.     Left hand: Swelling, deformity, tenderness and bony tenderness present. Decreased range of motion.  Neurological:     Mental Status: She is alert.     No results found for any visits on 09/17/22.      Assessment & Plan:   Problem List Items Addressed This Visit   None Visit Diagnoses     Pain in finger of left hand    -  Primary   Relevant Orders   DG Hand Complete Left        Return as previously scheduled..  Total time spent: 20 minutes  Miki Kins, FNP  09/17/2022

## 2022-09-21 ENCOUNTER — Other Ambulatory Visit: Payer: Self-pay | Admitting: Internal Medicine

## 2022-09-21 DIAGNOSIS — N186 End stage renal disease: Secondary | ICD-10-CM | POA: Diagnosis not present

## 2022-09-21 DIAGNOSIS — R188 Other ascites: Secondary | ICD-10-CM

## 2022-09-21 DIAGNOSIS — Z992 Dependence on renal dialysis: Secondary | ICD-10-CM | POA: Diagnosis not present

## 2022-09-21 DIAGNOSIS — N2581 Secondary hyperparathyroidism of renal origin: Secondary | ICD-10-CM | POA: Diagnosis not present

## 2022-09-23 DIAGNOSIS — Z992 Dependence on renal dialysis: Secondary | ICD-10-CM | POA: Diagnosis not present

## 2022-09-23 DIAGNOSIS — N186 End stage renal disease: Secondary | ICD-10-CM | POA: Diagnosis not present

## 2022-09-23 DIAGNOSIS — N2581 Secondary hyperparathyroidism of renal origin: Secondary | ICD-10-CM | POA: Diagnosis not present

## 2022-09-24 ENCOUNTER — Ambulatory Visit
Admission: RE | Admit: 2022-09-24 | Discharge: 2022-09-24 | Disposition: A | Discharge status: Home / Self Care | Payer: 59 | Source: Ambulatory Visit | Attending: Internal Medicine | Admitting: Internal Medicine

## 2022-09-24 DIAGNOSIS — I509 Heart failure, unspecified: Secondary | ICD-10-CM | POA: Diagnosis not present

## 2022-09-24 DIAGNOSIS — R188 Other ascites: Secondary | ICD-10-CM | POA: Diagnosis not present

## 2022-09-24 DIAGNOSIS — N186 End stage renal disease: Secondary | ICD-10-CM | POA: Diagnosis not present

## 2022-09-24 MED ORDER — ALBUMIN HUMAN 25 % IV SOLN: 25.0000 g | Freq: Once | INTRAVENOUS | Status: DC

## 2022-09-24 MED ORDER — ALBUMIN HUMAN 25 % IV SOLN
INTRAVENOUS | Status: AC
  Filled 2022-09-24: qty 100

## 2022-09-24 MED ORDER — LIDOCAINE HCL (PF) 1 % IJ SOLN
10.0000 mL | Freq: Once | INTRAMUSCULAR | Status: AC
  Administered 2022-09-24: 10 mL via INTRADERMAL

## 2022-09-24 NOTE — OR Nursing (Signed)
Messaged Dr Thedore Mins that patient had only 3 liters fluid removed so he said hold albumin administration this time.

## 2022-09-24 NOTE — Procedures (Signed)
PROCEDURE SUMMARY:  Successful US guided paracentesis from RUQ.  Yielded 3 Liters of clear yellow fluid.  No immediate complications.  Pt tolerated well.   Specimen was not sent for labs.  EBL < 64mL  Cloretta Ned 09/24/2022 2:33 PM

## 2022-09-25 DIAGNOSIS — Z992 Dependence on renal dialysis: Secondary | ICD-10-CM | POA: Diagnosis not present

## 2022-09-25 DIAGNOSIS — N2581 Secondary hyperparathyroidism of renal origin: Secondary | ICD-10-CM | POA: Diagnosis not present

## 2022-09-25 DIAGNOSIS — N186 End stage renal disease: Secondary | ICD-10-CM | POA: Diagnosis not present

## 2022-09-28 DIAGNOSIS — N186 End stage renal disease: Secondary | ICD-10-CM | POA: Diagnosis not present

## 2022-09-28 DIAGNOSIS — N2581 Secondary hyperparathyroidism of renal origin: Secondary | ICD-10-CM | POA: Diagnosis not present

## 2022-09-28 DIAGNOSIS — Z992 Dependence on renal dialysis: Secondary | ICD-10-CM | POA: Diagnosis not present

## 2022-09-30 DIAGNOSIS — Z992 Dependence on renal dialysis: Secondary | ICD-10-CM | POA: Diagnosis not present

## 2022-09-30 DIAGNOSIS — N186 End stage renal disease: Secondary | ICD-10-CM | POA: Diagnosis not present

## 2022-09-30 DIAGNOSIS — N2581 Secondary hyperparathyroidism of renal origin: Secondary | ICD-10-CM | POA: Diagnosis not present

## 2022-10-02 DIAGNOSIS — N2581 Secondary hyperparathyroidism of renal origin: Secondary | ICD-10-CM | POA: Diagnosis not present

## 2022-10-02 DIAGNOSIS — Z992 Dependence on renal dialysis: Secondary | ICD-10-CM | POA: Diagnosis not present

## 2022-10-02 DIAGNOSIS — N186 End stage renal disease: Secondary | ICD-10-CM | POA: Diagnosis not present

## 2022-10-03 IMAGING — DX DG KNEE 1-2V*R*
1 series · 1 of 1 positions shown · non-contrast
Comparison: None.

CLINICAL DATA: 59-year-old female with fall and trauma to the right
knee.

EXAM:
RIGHT KNEE - 1-2 VIEW

[knee lat]
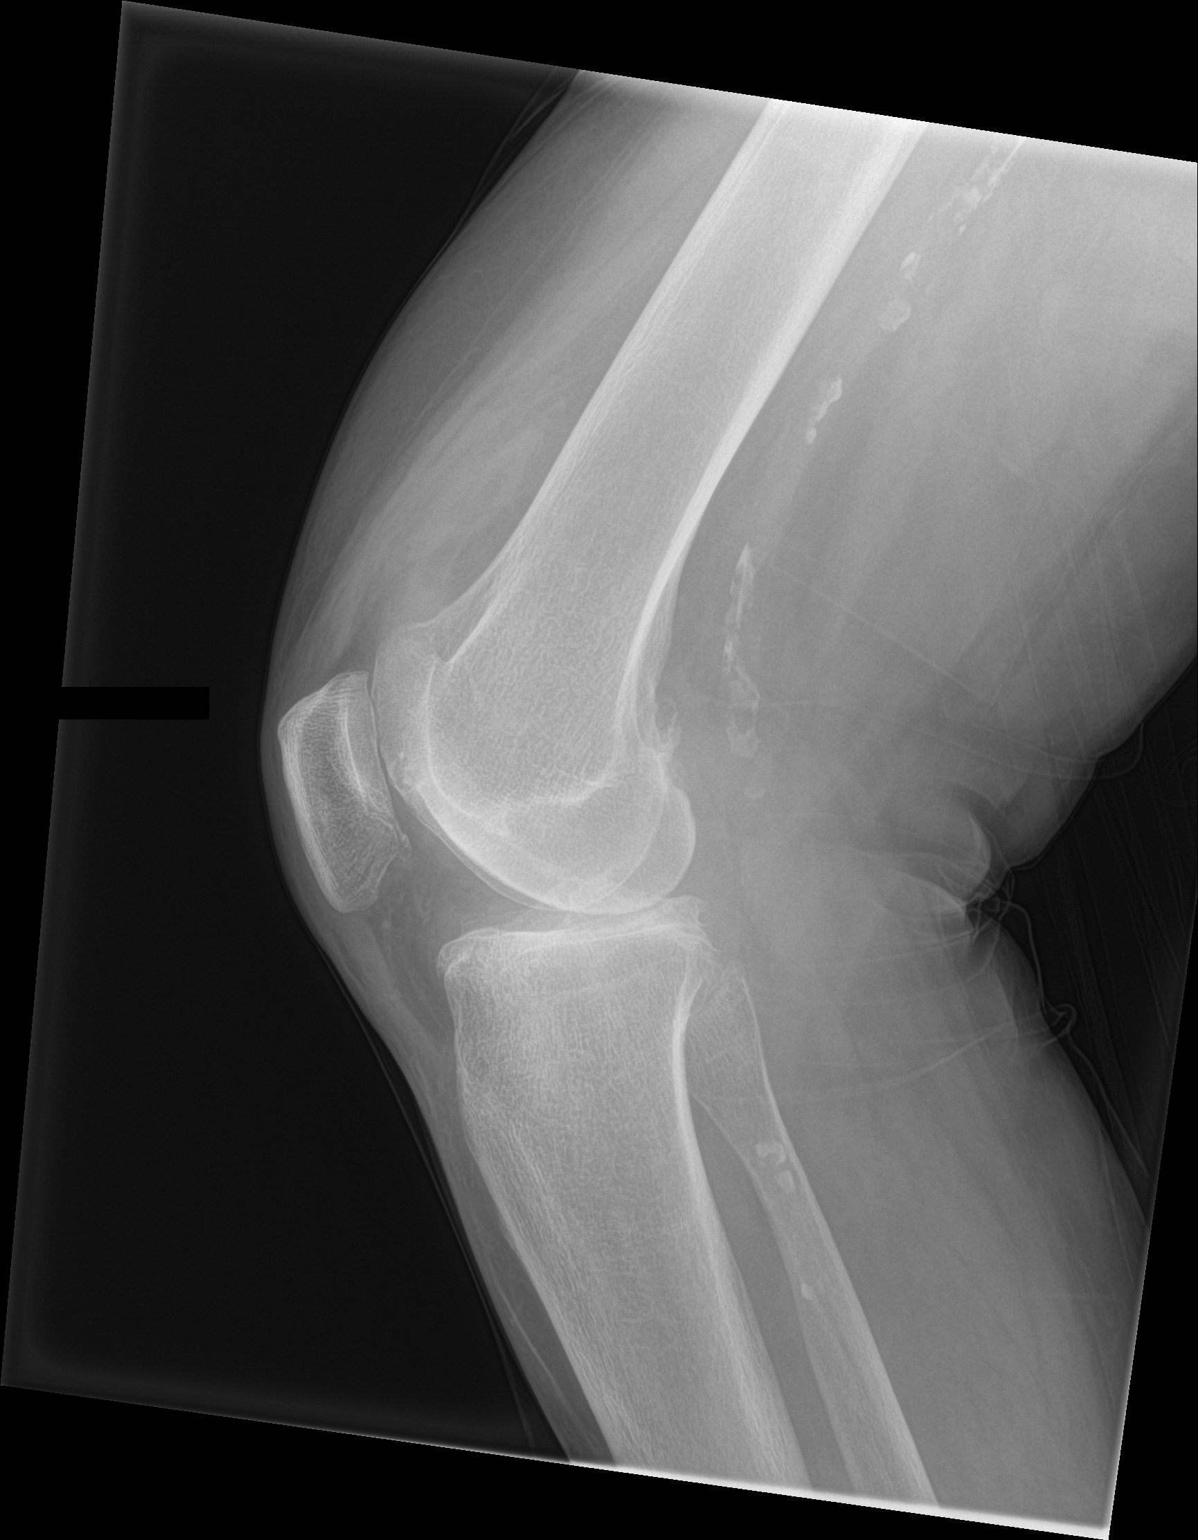

[1 of 1 positions shown; findings below may reference images not displayed]

FINDINGS: There is no acute fracture or dislocation. There is arthritic
changes of the right knee with tricompartmental narrowing and
spurring most prominent involving the medial compartment. There is a
small suprapatellar effusion. Vascular calcifications noted. The
soft tissues are unremarkable.
IMPRESSION: 1. No acute fracture or dislocation.
2. Osteoarthritic changes of the right knee and a small
suprapatellar effusion.

## 2022-10-03 IMAGING — DX DG SHOULDER 2+V*R*
3 series · 3 of 3 positions shown · non-contrast
Comparison: Chest radiograph dated 09/30/2020.

CLINICAL DATA: 59-year-old female with fall and right shoulder
pain.

EXAM:
RIGHT SHOULDER - 2+ VIEW

[shoulder swimmer]
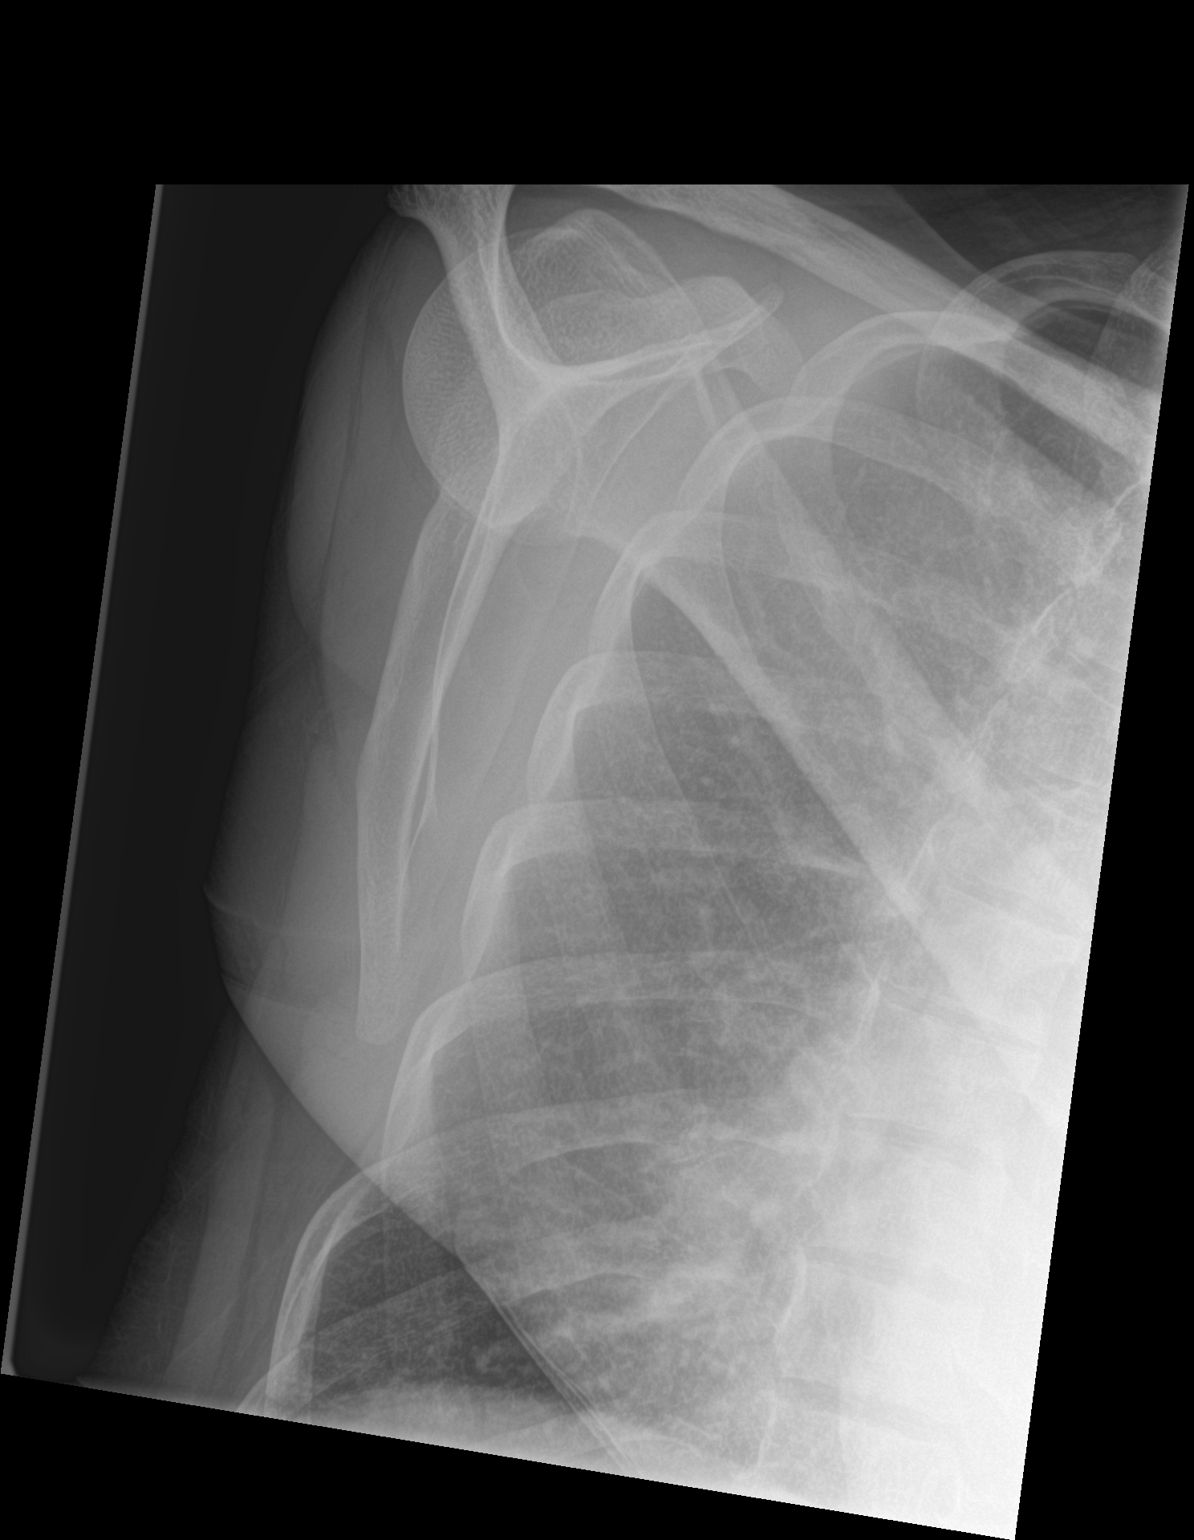

[shoulder ap]
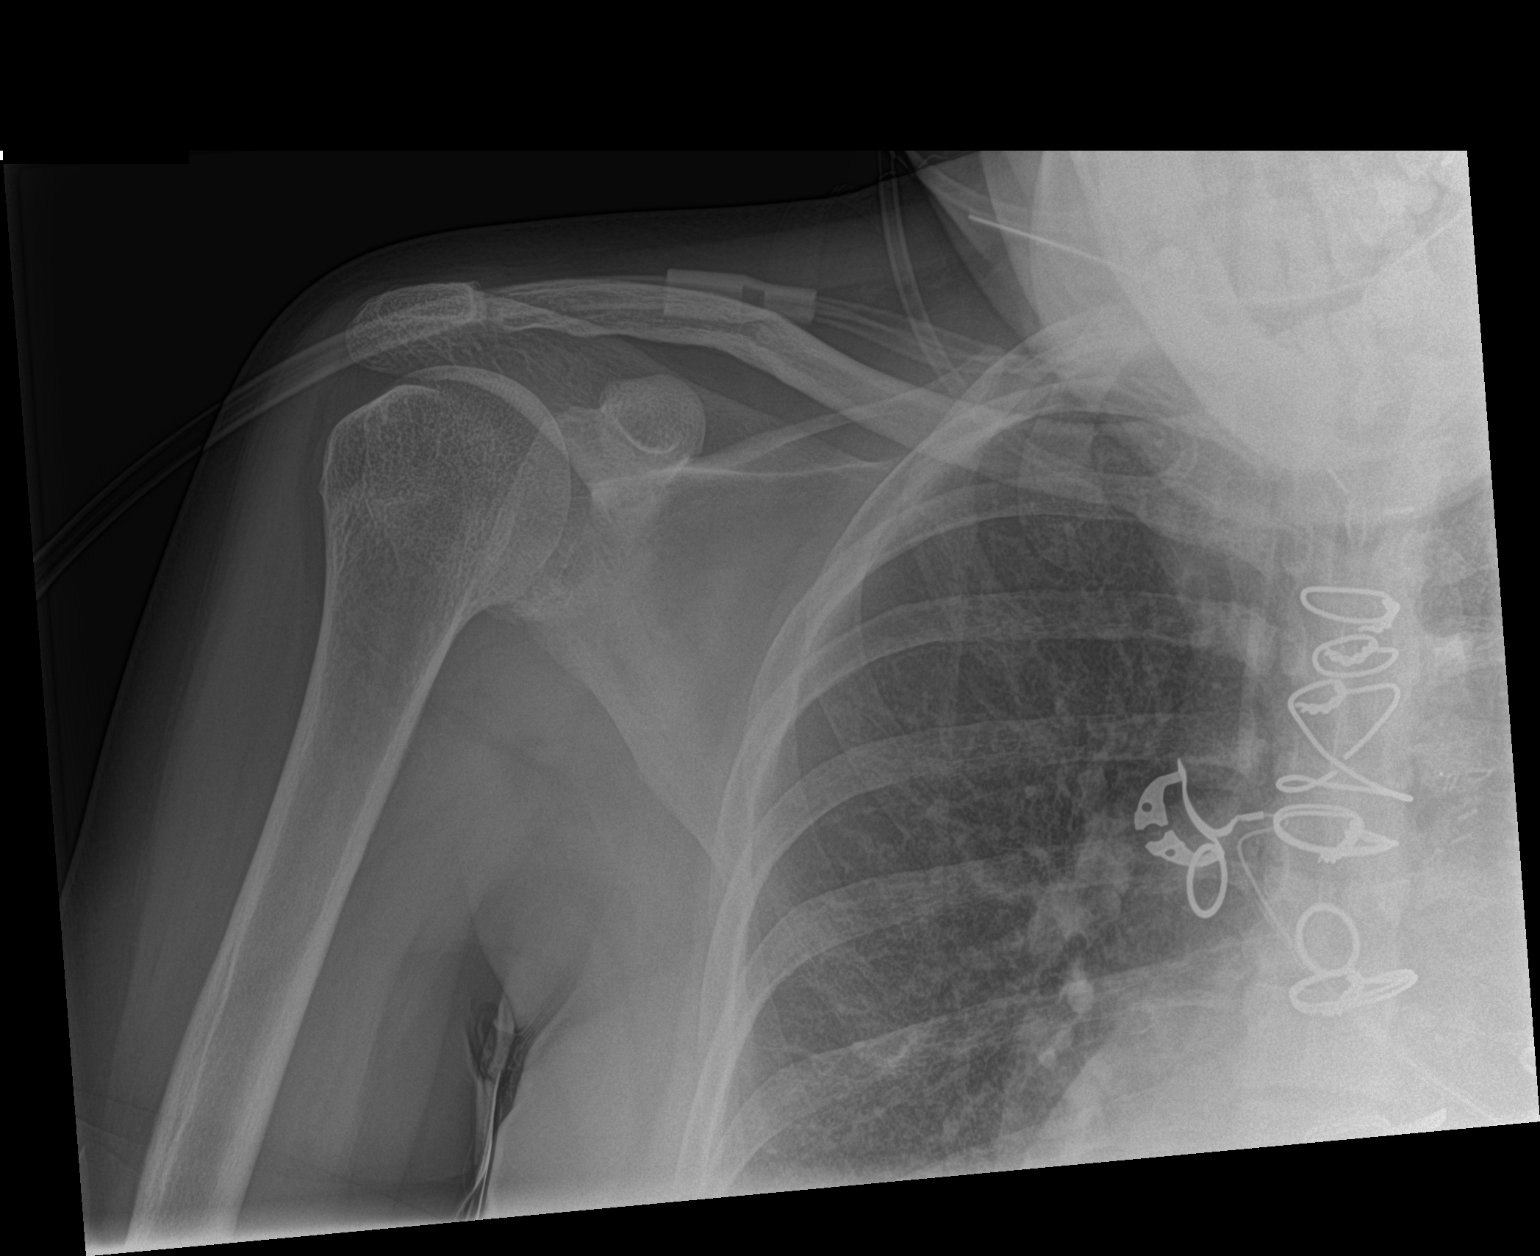

[shoulder obl]
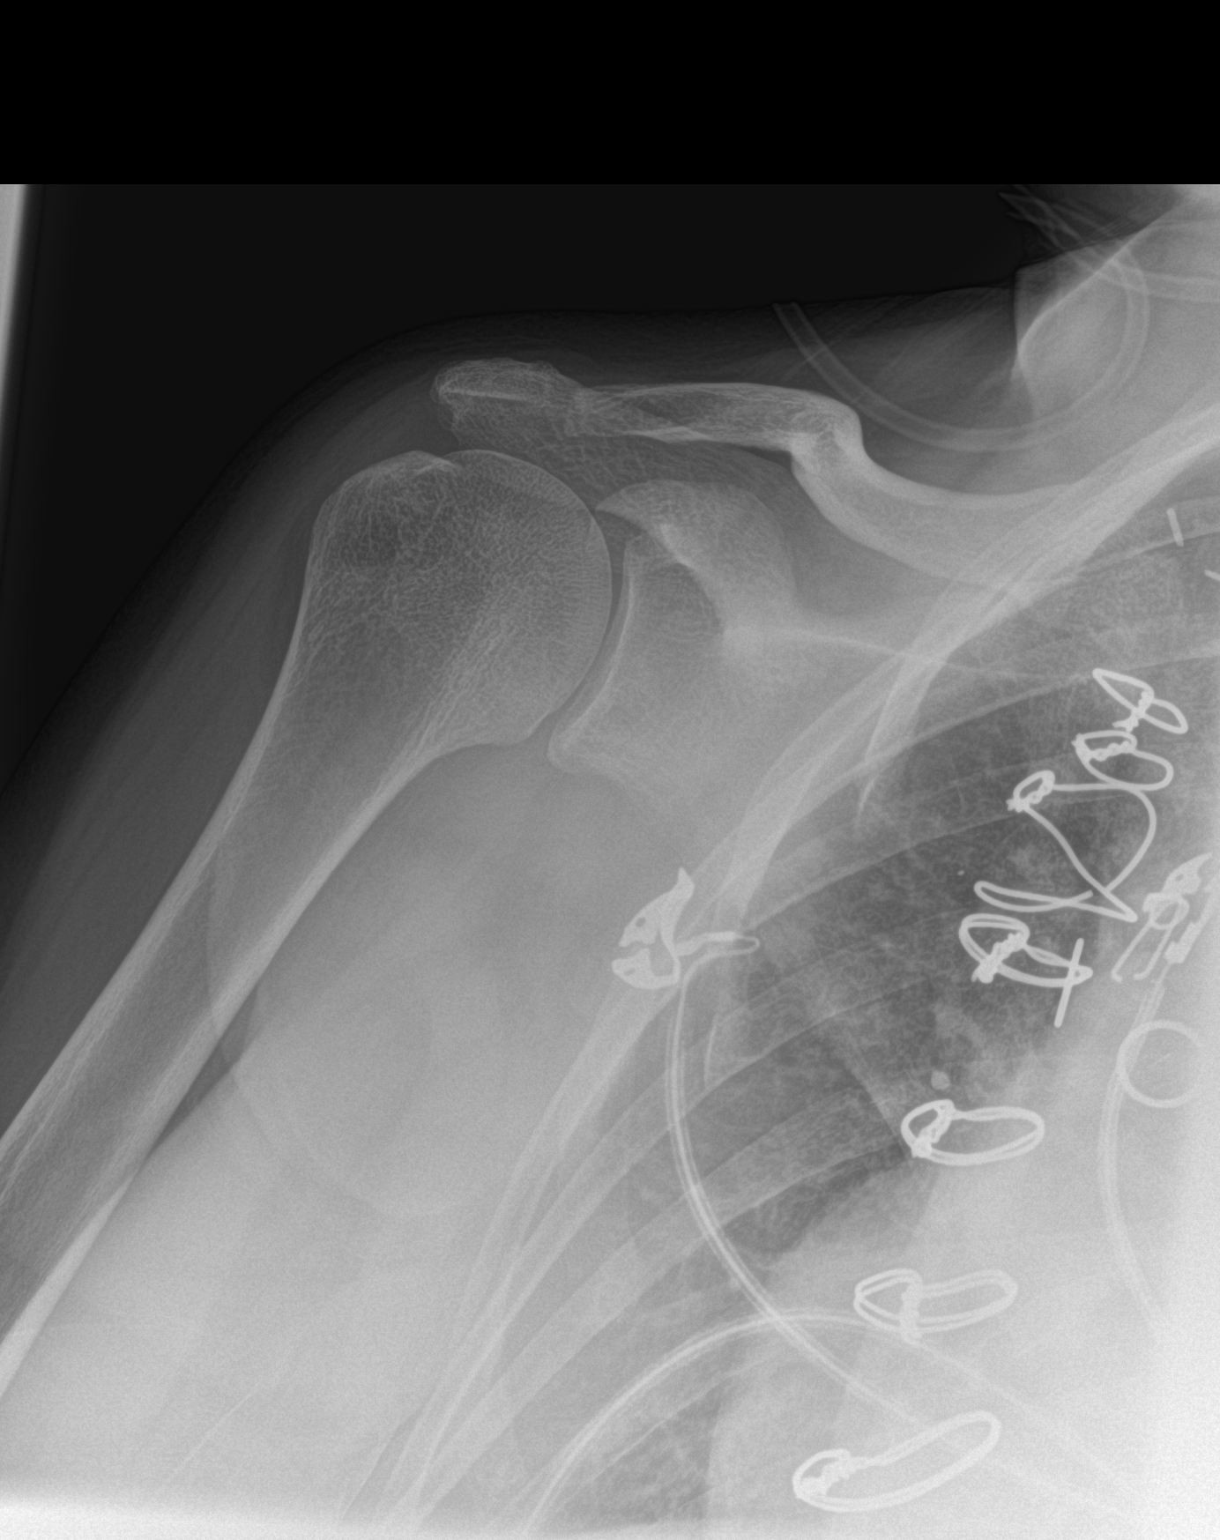

[3 of 3 positions shown; findings below may reference images not displayed]

FINDINGS: There is no evidence of fracture or dislocation. There is no
evidence of arthropathy or other focal bone abnormality. Soft
tissues are unremarkable.
IMPRESSION: Negative.

## 2022-10-03 IMAGING — CT CT CERVICAL SPINE W/O CM
3 of 4 series · 12 of 35 positions shown, 14 images · non-contrast
Comparison: CT head dated 08/22/2020 and MR cervical spine dated
06/25/2014.

CLINICAL DATA: Fall from standing with head and neck pain.

EXAM:
CT HEAD WITHOUT CONTRAST
CT CERVICAL SPINE WITHOUT CONTRAST
TECHNIQUE: Multidetector CT imaging of the head and cervical spine was
performed following the standard protocol without intravenous
contrast. Multiplanar CT image reconstructions of the cervical spine
were also generated.

[Series 6: orthogonal bone · axial · 0.27mm/px · z∈[-329,-198]mm · 4 of 104 slices shown, 5 images]
[im 15/104  soft-tissue]
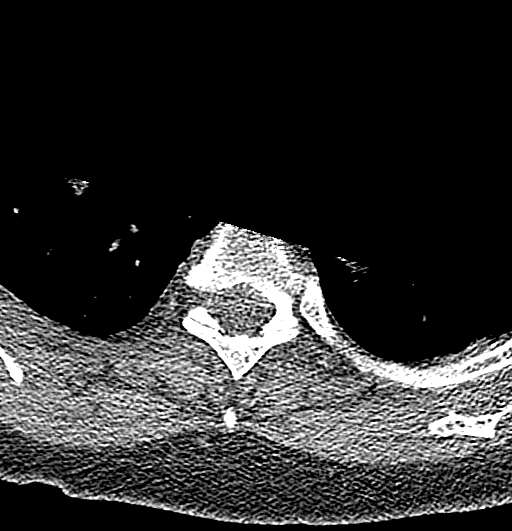
[im 15/104  bone]
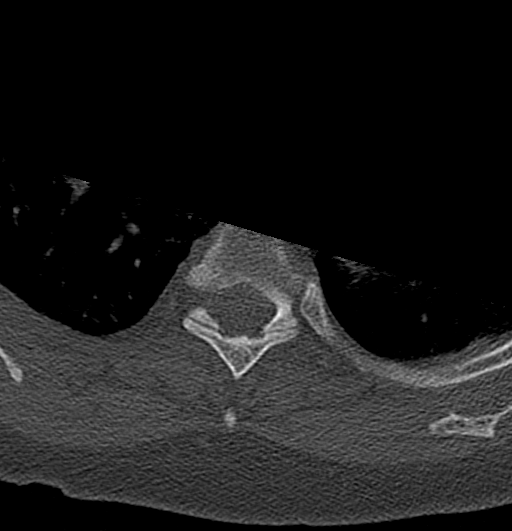
[im 45/104  bone]
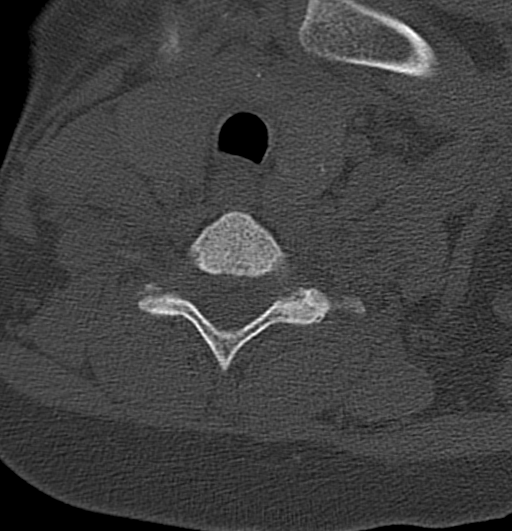
[im 59/104  bone]
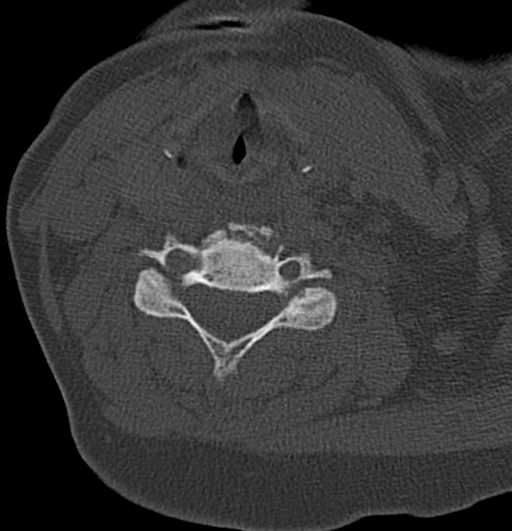
[im 89/104  bone]
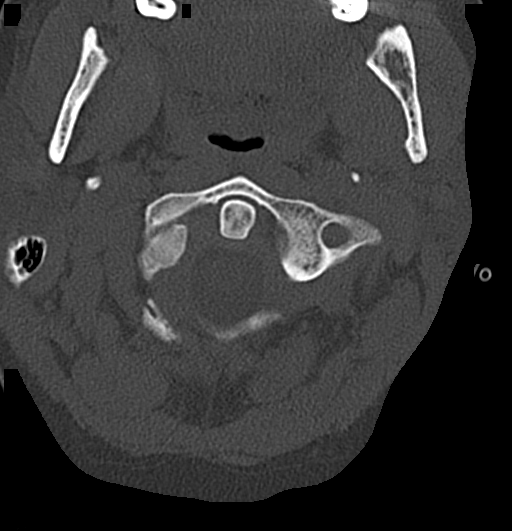

[Series 7: sagittal bone · sagittal · 0.28mm/px · 5 of 65 slices shown, 6 images]
[im 22/65  bone]
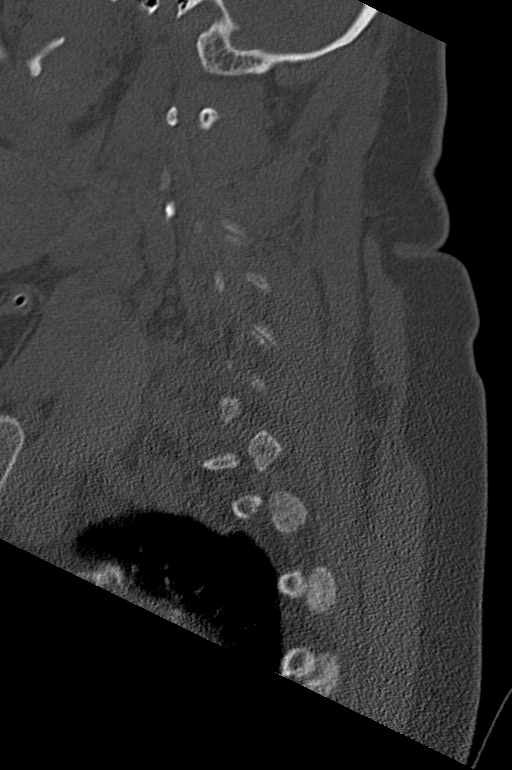
[im 27/65  bone]
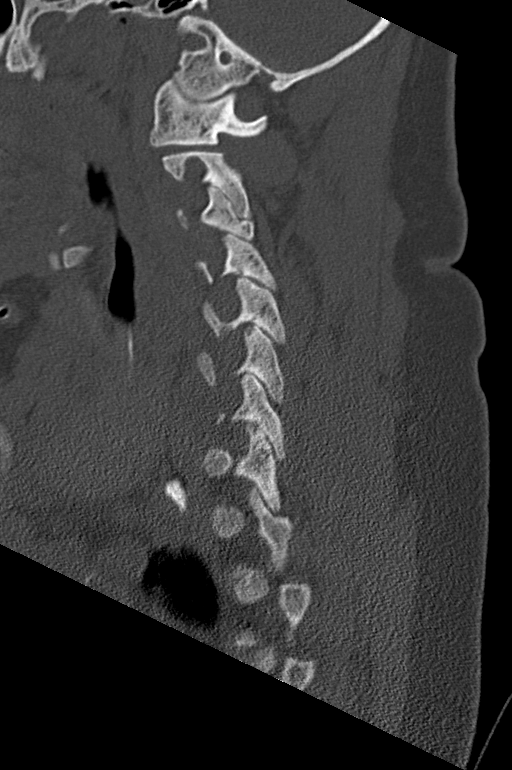
[im 33/65  soft-tissue]
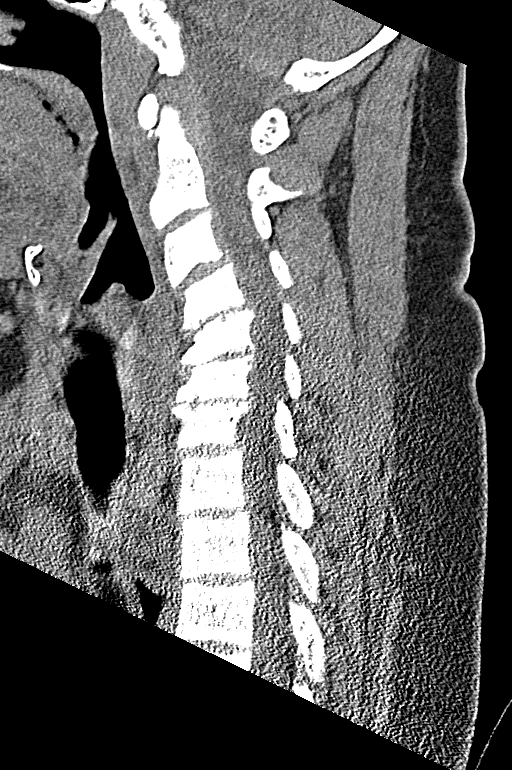
[im 33/65  bone]
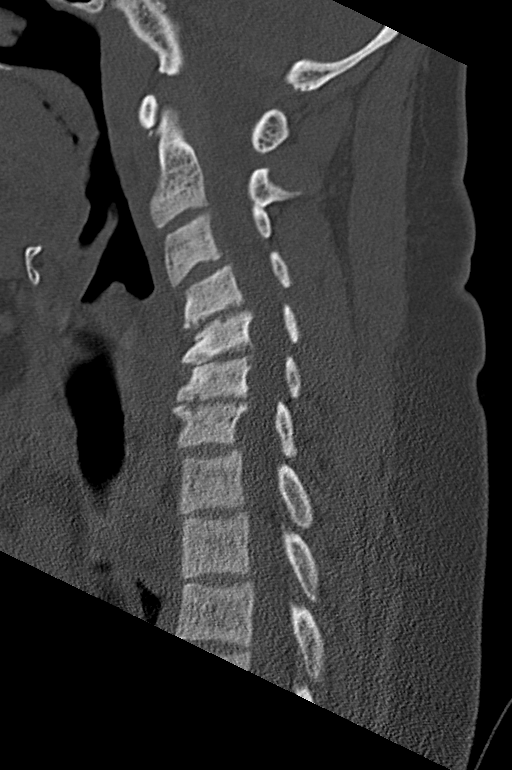
[im 38/65  bone]
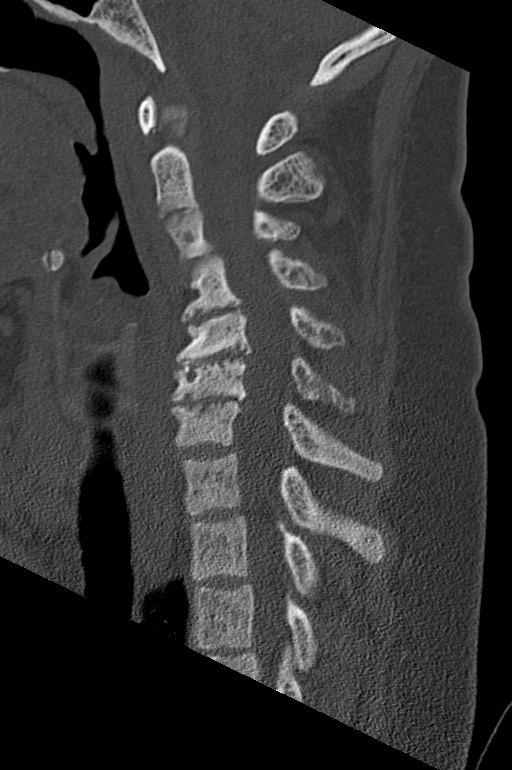
[im 43/65  bone]
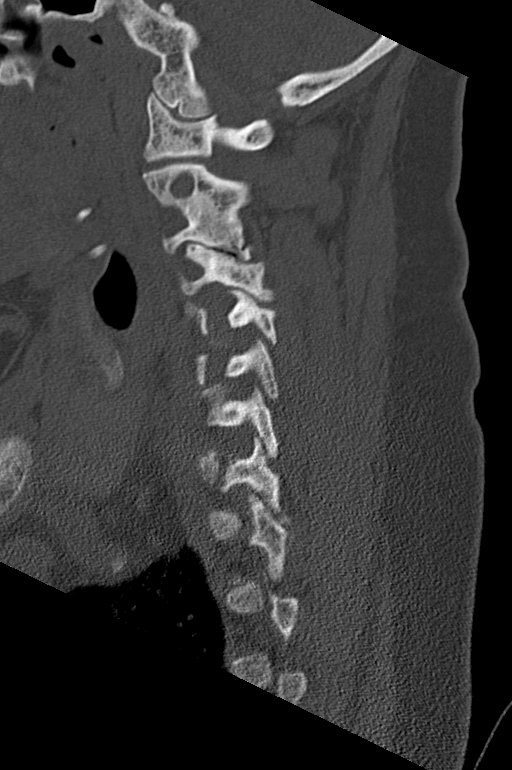

[Series 8: coronal bone · coronal · 0.24mm/px · 3 of 64 slices shown]
[im 16/64  bone]
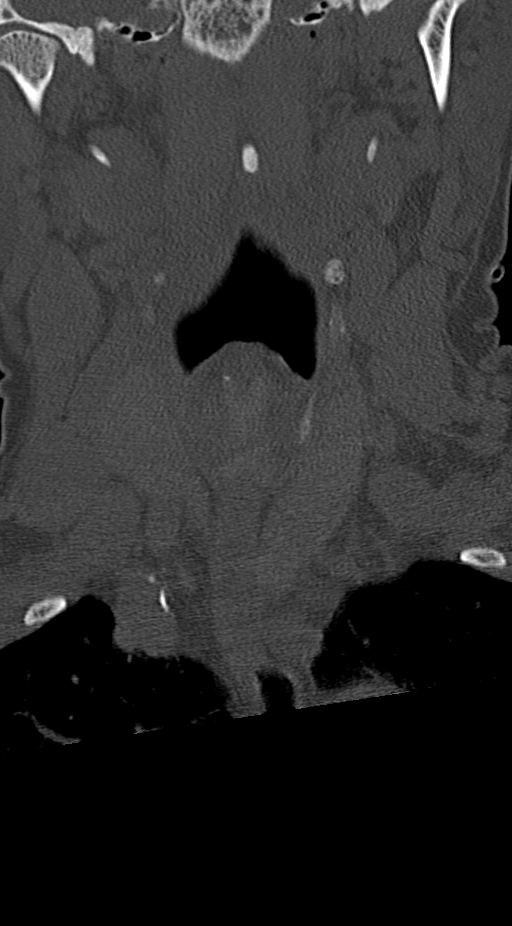
[im 27/64  bone]
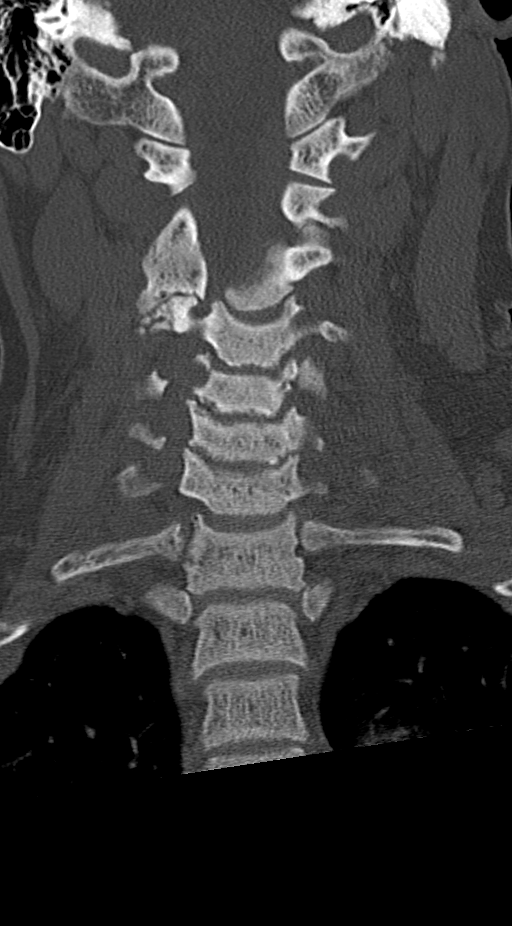
[im 37/64  bone]
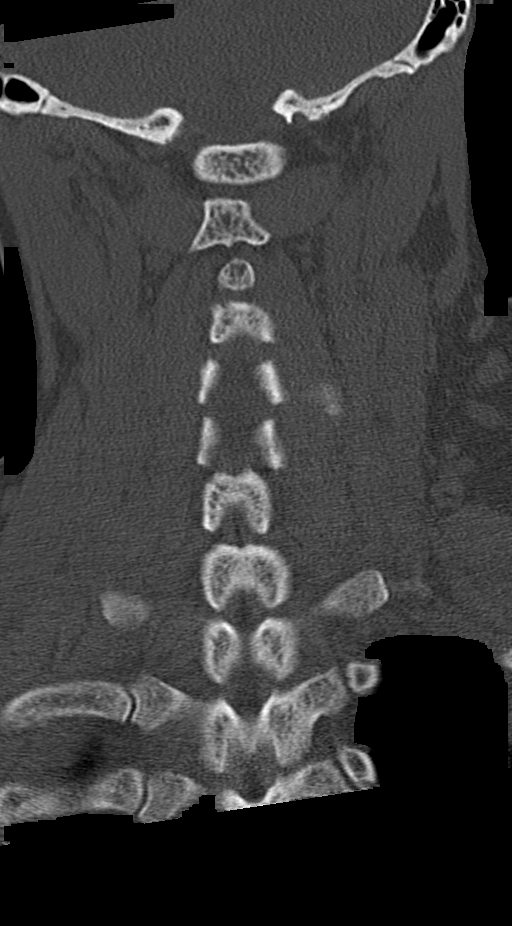

[12 of 35 positions shown; findings below may reference images not displayed]

FINDINGS: CT HEAD FINDINGS

Brain: No evidence of acute infarction, hemorrhage, hydrocephalus,
extra-axial collection or mass lesion/mass effect. There is mild
cerebral volume loss with associated ex vacuo dilatation.
Periventricular white matter hypoattenuation likely represents
chronic small vessel ischemic disease. Chronic infarcts in the
bilateral frontal, parietal, and occipital lobes as well as in the
right cerebellum are unchanged.

Vascular: There are vascular calcifications in the carotid siphons.

Skull: Normal. Negative for fracture or focal lesion.

Sinuses/Orbits: No acute finding.

Other: None.

CT CERVICAL SPINE FINDINGS

Alignment: Focal kyphosis at C4-5 is likely degenerative.

Skull base and vertebrae: No acute fracture. There is fusion of the
right lateral mass of C2-C3.

Soft tissues and spinal canal: No prevertebral fluid or swelling. No
visible canal hematoma.

Disc levels: There is severe degenerative disc disease from C4 to
C7. Facet arthropathy is most significant at C3-4 on the right.

Upper chest: Negative.

Other: None.
IMPRESSION: 1. No acute intracranial process.
2. No acute osseous injury in the cervical spine.

## 2022-10-03 IMAGING — CT CT HEAD W/O CM
3 series · 15 of 47 positions shown, 18 images · non-contrast
Comparison: CT head dated 08/22/2020 and MR cervical spine dated
06/25/2014.

CLINICAL DATA: Fall from standing with head and neck pain.

EXAM:
CT HEAD WITHOUT CONTRAST
CT CERVICAL SPINE WITHOUT CONTRAST
TECHNIQUE: Multidetector CT imaging of the head and cervical spine was
performed following the standard protocol without intravenous
contrast. Multiplanar CT image reconstructions of the cervical spine
were also generated.

[Series 2: head wo · axial · 0.43mm/px · z∈[-159,-34]mm · 9 of 31 slices shown, 12 images]
[im 3/31  brain]
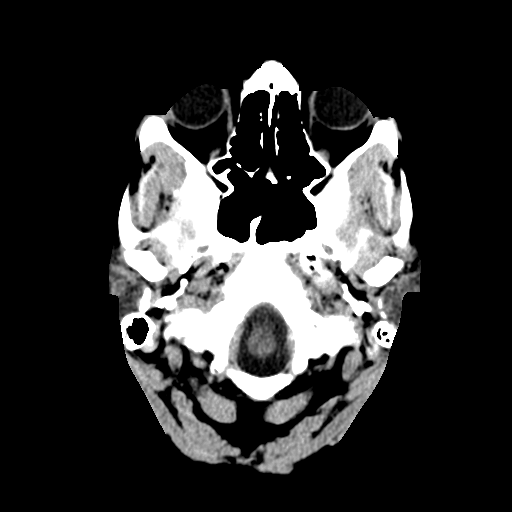
[im 3/31  bone]
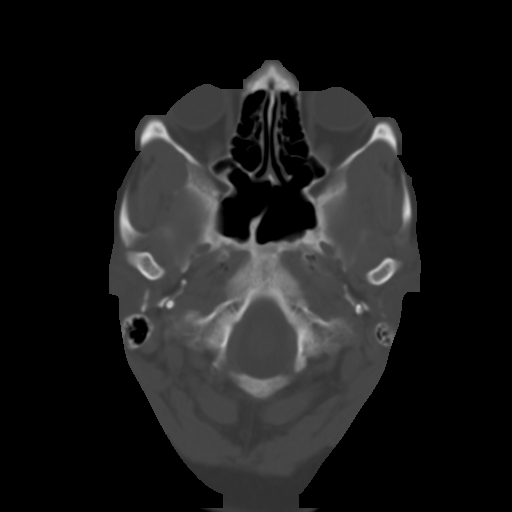
[im 6/31  brain]
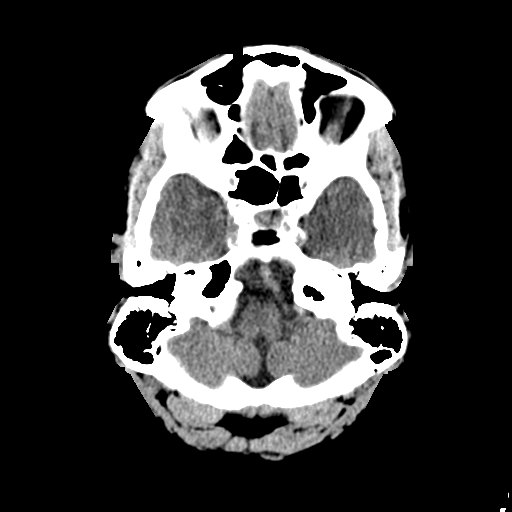
[im 9/31  brain]
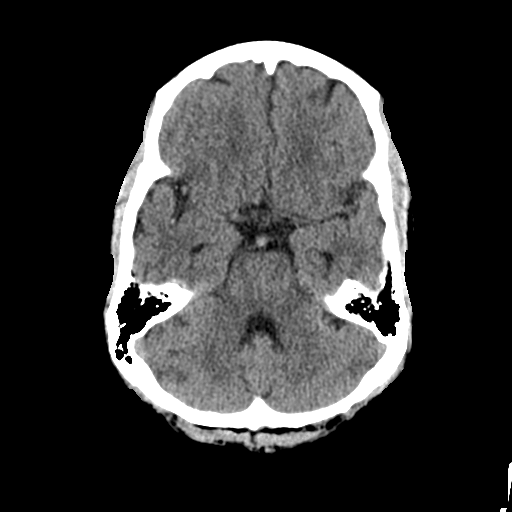
[im 12/31  brain]
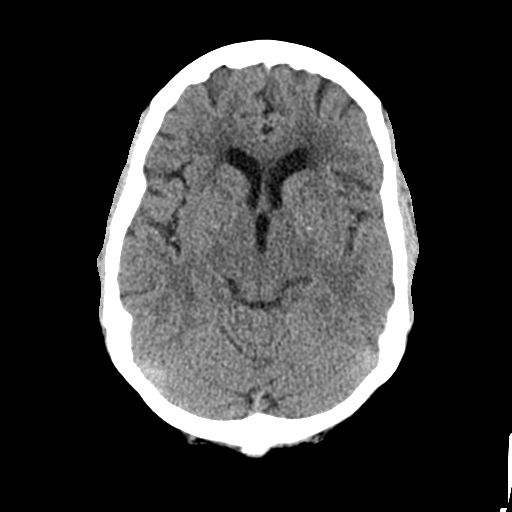
[im 16/31  brain]
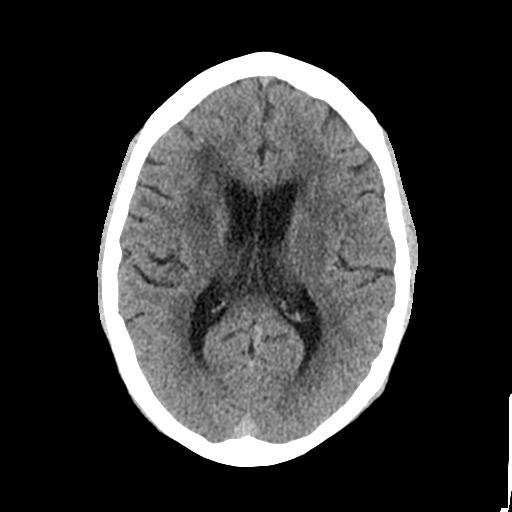
[im 16/31  bone]
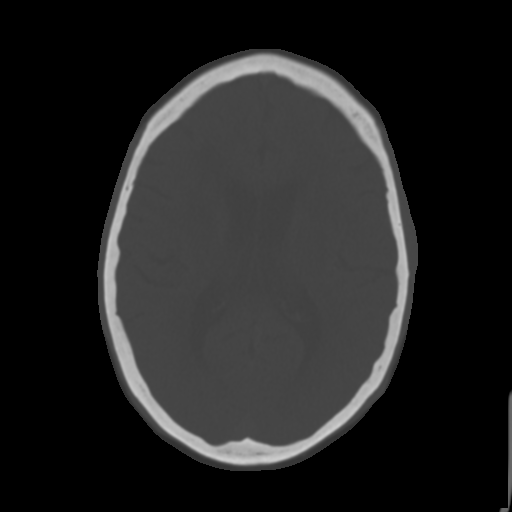
[im 19/31  brain]
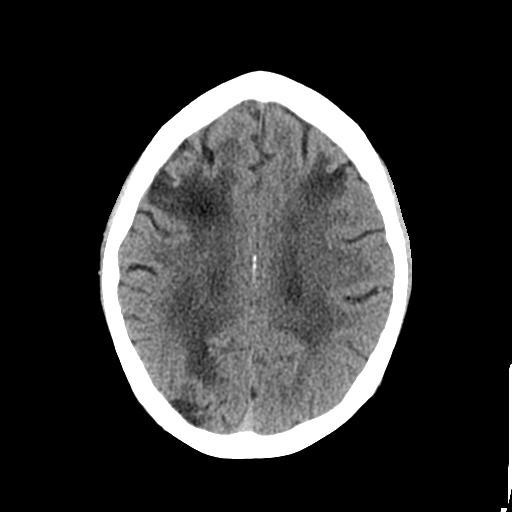
[im 22/31  brain]
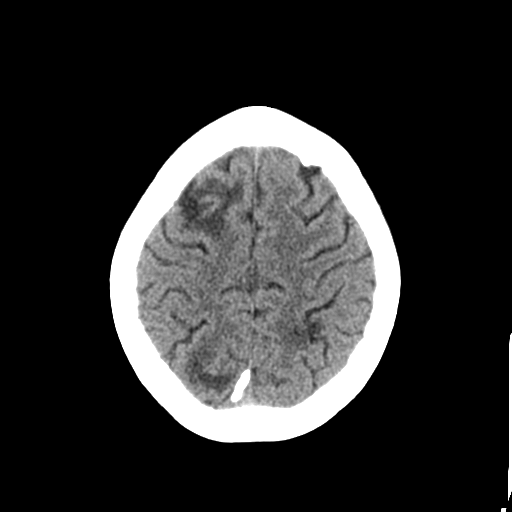
[im 25/31  brain]
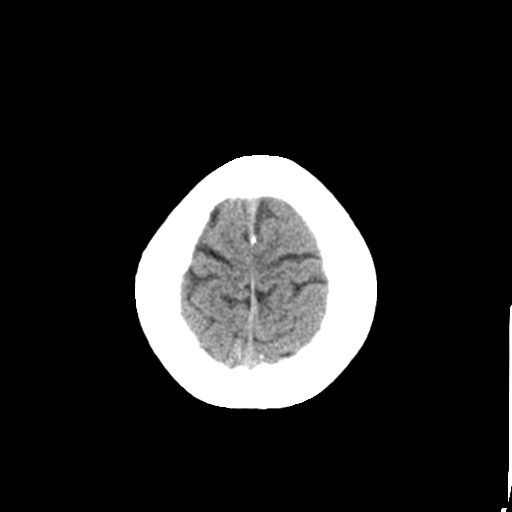
[im 28/31  brain]
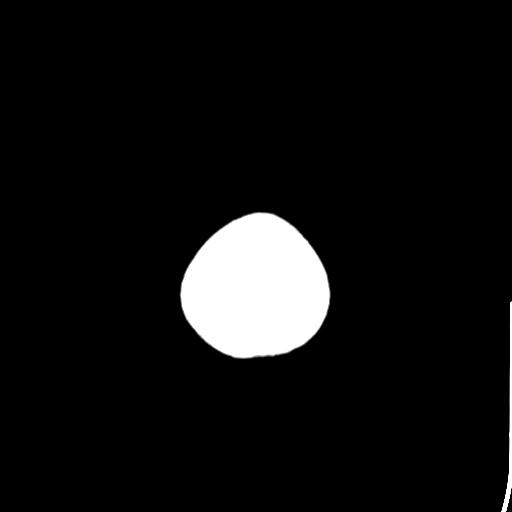
[im 28/31  bone]
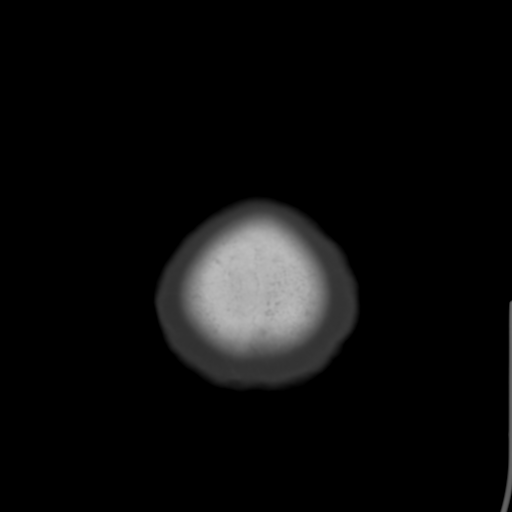

[Series 4: coronal soft tissue · coronal · 0.34mm/px · 3 of 70 slices shown]
[im 24/70  brain]
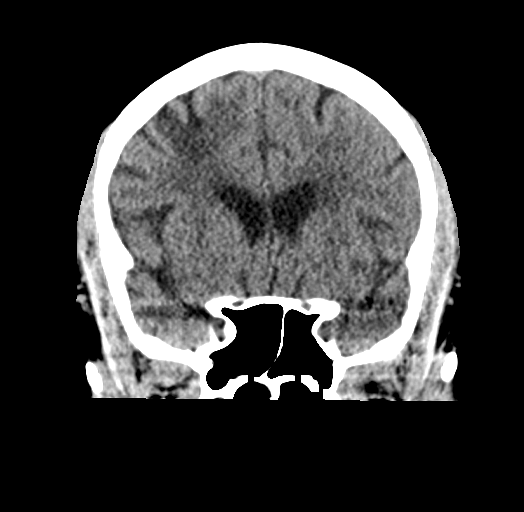
[im 31/70  brain]
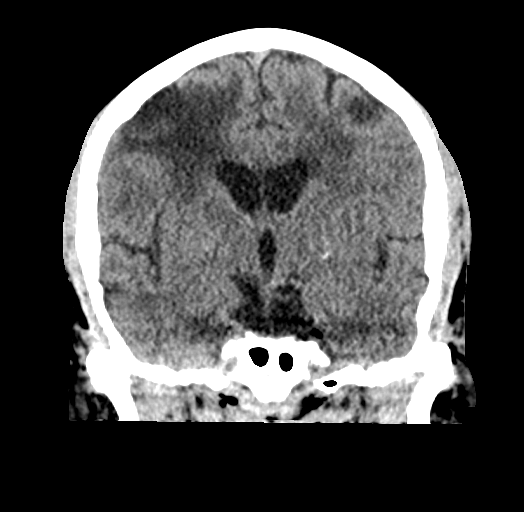
[im 39/70  brain]
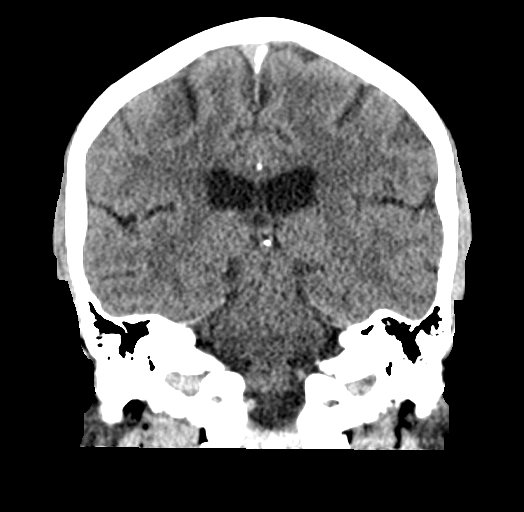

[Series 5: sagittal soft tissue · sagittal · 0.33mm/px · 3 of 56 slices shown]
[im 19/56  brain]
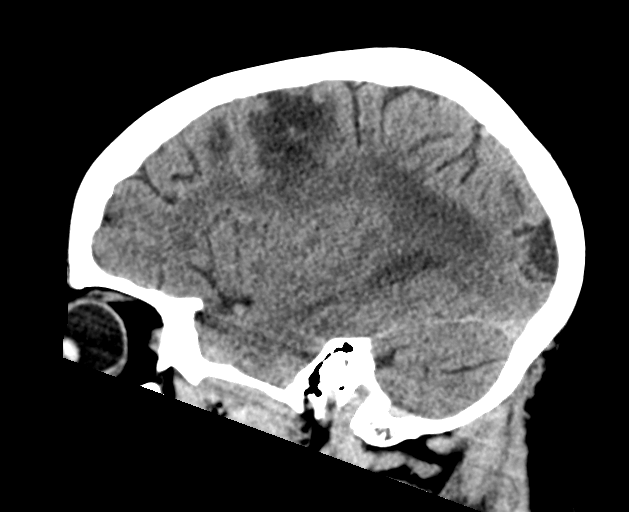
[im 28/56  brain]
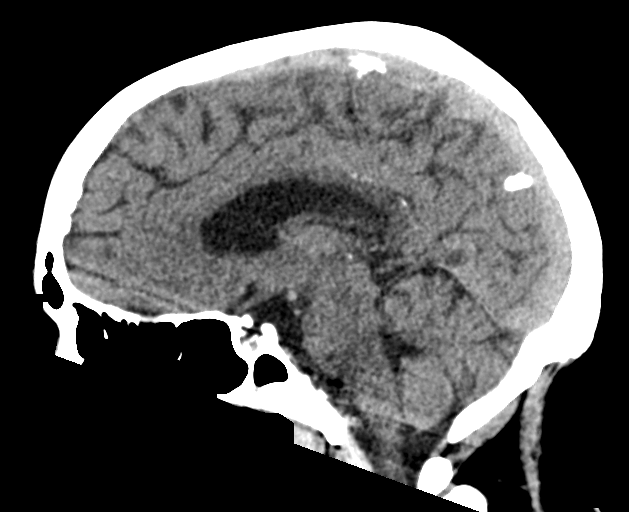
[im 37/56  brain]
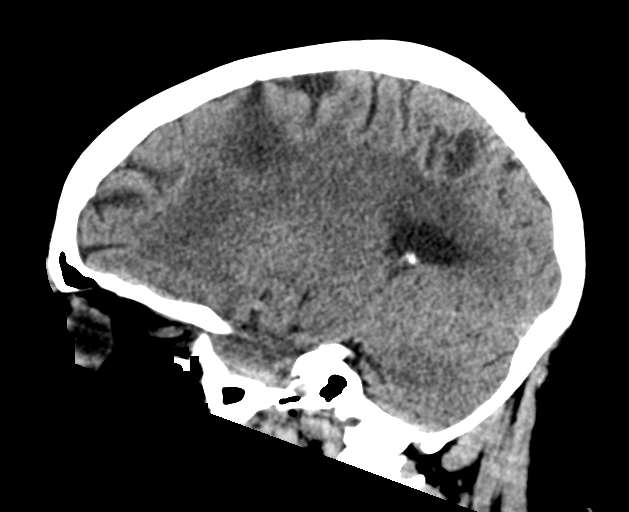

[15 of 47 positions shown; findings below may reference images not displayed]

FINDINGS: CT HEAD FINDINGS

Brain: No evidence of acute infarction, hemorrhage, hydrocephalus,
extra-axial collection or mass lesion/mass effect. There is mild
cerebral volume loss with associated ex vacuo dilatation.
Periventricular white matter hypoattenuation likely represents
chronic small vessel ischemic disease. Chronic infarcts in the
bilateral frontal, parietal, and occipital lobes as well as in the
right cerebellum are unchanged.

Vascular: There are vascular calcifications in the carotid siphons.

Skull: Normal. Negative for fracture or focal lesion.

Sinuses/Orbits: No acute finding.

Other: None.

CT CERVICAL SPINE FINDINGS

Alignment: Focal kyphosis at C4-5 is likely degenerative.

Skull base and vertebrae: No acute fracture. There is fusion of the
right lateral mass of C2-C3.

Soft tissues and spinal canal: No prevertebral fluid or swelling. No
visible canal hematoma.

Disc levels: There is severe degenerative disc disease from C4 to
C7. Facet arthropathy is most significant at C3-4 on the right.

Upper chest: Negative.

Other: None.
IMPRESSION: 1. No acute intracranial process.
2. No acute osseous injury in the cervical spine.

## 2022-10-05 ENCOUNTER — Other Ambulatory Visit: Payer: Self-pay | Admitting: Internal Medicine

## 2022-10-05 DIAGNOSIS — R188 Other ascites: Secondary | ICD-10-CM

## 2022-10-05 DIAGNOSIS — N186 End stage renal disease: Secondary | ICD-10-CM | POA: Diagnosis not present

## 2022-10-05 DIAGNOSIS — Z992 Dependence on renal dialysis: Secondary | ICD-10-CM | POA: Diagnosis not present

## 2022-10-05 DIAGNOSIS — N2581 Secondary hyperparathyroidism of renal origin: Secondary | ICD-10-CM | POA: Diagnosis not present

## 2022-10-07 DIAGNOSIS — Z992 Dependence on renal dialysis: Secondary | ICD-10-CM | POA: Diagnosis not present

## 2022-10-07 DIAGNOSIS — N186 End stage renal disease: Secondary | ICD-10-CM | POA: Diagnosis not present

## 2022-10-07 DIAGNOSIS — N2581 Secondary hyperparathyroidism of renal origin: Secondary | ICD-10-CM | POA: Diagnosis not present

## 2022-10-08 ENCOUNTER — Ambulatory Visit (INDEPENDENT_AMBULATORY_CARE_PROVIDER_SITE_OTHER): Payer: 59 | Admitting: Family

## 2022-10-08 ENCOUNTER — Ambulatory Visit
Admission: RE | Admit: 2022-10-08 | Discharge: 2022-10-08 | Disposition: A | Discharge status: Home / Self Care | Payer: 59 | Source: Ambulatory Visit | Attending: Internal Medicine | Admitting: Internal Medicine

## 2022-10-08 ENCOUNTER — Ambulatory Visit (INDEPENDENT_AMBULATORY_CARE_PROVIDER_SITE_OTHER): Payer: 59

## 2022-10-08 VITALS — BP 110/62 | HR 82 | Ht 66.0 in | Wt 160.0 lb

## 2022-10-08 DIAGNOSIS — D696 Thrombocytopenia, unspecified: Secondary | ICD-10-CM | POA: Diagnosis not present

## 2022-10-08 DIAGNOSIS — R188 Other ascites: Secondary | ICD-10-CM | POA: Insufficient documentation

## 2022-10-08 DIAGNOSIS — I200 Unstable angina: Secondary | ICD-10-CM | POA: Diagnosis not present

## 2022-10-08 DIAGNOSIS — I5043 Acute on chronic combined systolic (congestive) and diastolic (congestive) heart failure: Secondary | ICD-10-CM

## 2022-10-08 DIAGNOSIS — J9611 Chronic respiratory failure with hypoxia: Secondary | ICD-10-CM

## 2022-10-08 DIAGNOSIS — Z992 Dependence on renal dialysis: Secondary | ICD-10-CM | POA: Diagnosis not present

## 2022-10-08 DIAGNOSIS — I480 Paroxysmal atrial fibrillation: Secondary | ICD-10-CM | POA: Diagnosis not present

## 2022-10-08 DIAGNOSIS — R5381 Other malaise: Secondary | ICD-10-CM | POA: Diagnosis not present

## 2022-10-08 DIAGNOSIS — I5042 Chronic combined systolic (congestive) and diastolic (congestive) heart failure: Secondary | ICD-10-CM

## 2022-10-08 DIAGNOSIS — N186 End stage renal disease: Secondary | ICD-10-CM | POA: Diagnosis not present

## 2022-10-08 DIAGNOSIS — S62647D Nondisplaced fracture of proximal phalanx of left little finger, subsequent encounter for fracture with routine healing: Secondary | ICD-10-CM

## 2022-10-08 DIAGNOSIS — K7469 Other cirrhosis of liver: Secondary | ICD-10-CM | POA: Insufficient documentation

## 2022-10-08 MED ORDER — LIDOCAINE HCL (PF) 1 % IJ SOLN
10.0000 mL | Freq: Once | INTRAMUSCULAR | Status: AC
  Administered 2022-10-08: 10 mL via INTRADERMAL
  Filled 2022-10-08: qty 10

## 2022-10-08 NOTE — Procedures (Signed)
PROCEDURE SUMMARY:  Successful US guided paracentesis from RLQ. Yielded 2.2 L of clear yellow fluid.  No immediate complications.  Pt tolerated well.   Specimen was not sent for labs.  EBL < 5mL  Cloretta Ned 10/08/2022 3:56 PM

## 2022-10-09 DIAGNOSIS — N2581 Secondary hyperparathyroidism of renal origin: Secondary | ICD-10-CM | POA: Diagnosis not present

## 2022-10-09 DIAGNOSIS — Z992 Dependence on renal dialysis: Secondary | ICD-10-CM | POA: Diagnosis not present

## 2022-10-09 DIAGNOSIS — N186 End stage renal disease: Secondary | ICD-10-CM | POA: Diagnosis not present

## 2022-10-11 ENCOUNTER — Encounter: Payer: Self-pay | Admitting: Family

## 2022-10-11 NOTE — Assessment & Plan Note (Signed)
Patient well managed by cardiology currently.  Continue current meds Will continue deferring to them for treatment.   

## 2022-10-11 NOTE — Assessment & Plan Note (Signed)
Patient's liver enzymes are checked at dialysis.  She will be sure that they get me these results as is appropriate.

## 2022-10-11 NOTE — Assessment & Plan Note (Signed)
Likely 2/2 her liver.  Will continue monitoring her symptoms going forward and will adjust as needed.

## 2022-10-11 NOTE — Assessment & Plan Note (Signed)
Patient well managed by cardiology currently.  Continue current meds Will continue deferring to them for treatment.

## 2022-10-11 NOTE — Assessment & Plan Note (Signed)
Patient is managed by Cardiology and is doing well.  Denies any increase in chest pains today.   Continue current plan of care.

## 2022-10-11 NOTE — Progress Notes (Signed)
Established Patient Office Visit  Subjective:  Patient ID: [name], female    DOB: 1961/06/03  Age: 62 y.o. MRN: [id number]  Chief Complaint  Patient presents with   Follow-up    3 month follow up    Patient is here today for her 3 months follow up.  She has been feeling fairly well since last appointment.   She does not have additional concerns to discuss today.  Is still having pain in her left pinky, the x-rays did show a fracture in the pinky, patient was advised at that time to buddy tape this then go to [facility].   She did buddy tape the finger, but she has not been to ortho.   Labs are done at dialysis. She needs refills.   I have reviewed her active problem list, medication list, allergies, notes from last encounter for her appointment today.    No other concerns at this time.   Past Medical History:  Diagnosis Date   Adult behavior problems    frontal lobe CVA   Anemia    chronic disease   Atrial flutter by electrocardiogram (HCC) 03/22/2014   Cardiomyopathy (HCC)    CHF (congestive heart failure) (HCC)    Chicken pox    Chronic respiratory failure with hypoxia (HCC) 12/19/2021   Coronary artery disease    Delayed surgical wound healing 04/12/2014   Overview:  Left leg   GERD (gastroesophageal reflux disease)    H/O atrial flutter 04/12/2014   H/O stroke without residual deficits 02/13/2014   H/O: substance abuse (HCC) 02/27/2014   Heart failure (HCC)    Hepatitis    history of hep c   History of CVA (cerebrovascular accident) 02/21/2014   History of delirium 04/12/2014   Hyperkalemia 11/13/2014   Hyperlipidemia    Hypertension    Hypotension 02/24/2014   Leukocytosis 02/26/2014   Myocardial infarction [facility])    Obesity    Paroxysmal atrial fibrillation (HCC)    Peripheral vascular disease (HCC)    Postoperative anemia due to acute blood loss 02/27/2014   Renal failure    Renal insufficiency    Stroke [facility]) 2011   Superficial  incisional surgical site infection 03/14/2014   Syncope and collapse 07/25/2020    Past Surgical History:  Procedure Laterality Date   A/V FISTULAGRAM Left 03/30/2019   Procedure: A/V FISTULAGRAM;  Surgeon: Annice Needy, MD;  Location: [facility] INVASIVE CV LAB;  Service: Cardiovascular;  Laterality: Left;   A/V FISTULAGRAM Left 10/06/2019   Procedure: A/V FISTULAGRAM;  Surgeon: Annice Needy, MD;  Location: [facility] INVASIVE CV LAB;  Service: Cardiovascular;  Laterality: Left;   A/V FISTULAGRAM Left 05/19/2021   Procedure: A/V FISTULAGRAM;  Surgeon: Annice Needy, MD;  Location: [facility] INVASIVE CV LAB;  Service: Cardiovascular;  Laterality: Left;   A/V FISTULAGRAM Left 01/29/2022   Procedure: A/V Fistulagram;  Surgeon: Annice Needy, MD;  Location: [facility] INVASIVE CV LAB;  Service: Cardiovascular;  Laterality: Left;   A/V FISTULAGRAM Left 04/30/2022   Procedure: A/V Fistulagram;  Surgeon: Annice Needy, MD;  Location: [facility] INVASIVE CV LAB;  Service: Cardiovascular;  Laterality: Left;   A/V FISTULAGRAM Left 08/20/2022   Procedure: A/V Fistulagram;  Surgeon: Annice Needy, MD;  Location: [facility] INVASIVE CV LAB;  Service: Cardiovascular;  Laterality: Left;   COLONOSCOPY     COLONOSCOPY WITH PROPOFOL N/A 04/22/2021   Procedure: COLONOSCOPY WITH PROPOFOL;  Surgeon: Regis Bill, MD;  Location: [facility] ENDOSCOPY;  Service:  Endoscopy;  Laterality: N/A;   CORONARY ANGIOPLASTY WITH STENT PLACEMENT  2013   CORONARY ARTERY BYPASS GRAFT     DIALYSIS FISTULA CREATION     ESOPHAGOGASTRODUODENOSCOPY     ESOPHAGOGASTRODUODENOSCOPY N/A 04/22/2021   Procedure: ESOPHAGOGASTRODUODENOSCOPY (EGD);  Surgeon: Regis Bill, MD;  Location: [facility] ENDOSCOPY;  Service: Endoscopy;  Laterality: N/A;   FLEXIBLE SIGMOIDOSCOPY N/A 02/23/2019   Procedure: FLEXIBLE SIGMOIDOSCOPY;  Surgeon: Christena Deem, MD;  Location: [facility] ENDOSCOPY;  Service: Endoscopy;  Laterality: N/A;   LEFT HEART CATH AND CORS/GRAFTS ANGIOGRAPHY N/A 08/08/2018    Procedure: LEFT HEART CATH AND CORS/GRAFTS ANGIOGRAPHY;  Surgeon: Laurier Nancy, MD;  Location: [facility] INVASIVE CV LAB;  Service: Cardiovascular;  Laterality: N/A;   LEFT HEART CATH AND CORS/GRAFTS ANGIOGRAPHY N/A 01/30/2019   Procedure: LEFT HEART CATH AND CORS/GRAFTS ANGIOGRAPHY;  Surgeon: Laurier Nancy, MD;  Location: [facility] INVASIVE CV LAB;  Service: Cardiovascular;  Laterality: N/A;   percutaneous insertion intra aortic balloon right cath     ultrasound guided pericardiocentesis      Social History   Socioeconomic History   Marital status: Single    Spouse name: Not on file   Number of children: Not on file   Years of education: Not on file   Highest education level: Not on file  Occupational History   Not on file  Tobacco Use   Smoking status: Some Days    Packs/day: 0.25    Years: 20.00    Additional pack years: 0.00    Total pack years: 5.00    Types: Cigarettes   Smokeless tobacco: Never   Tobacco comments:    smokes two cigarettes a day  Vaping Use   Vaping Use: Never used  Substance and Sexual Activity   Alcohol use: No   Drug use: No   Sexual activity: Not Currently    Birth control/protection: Post-menopausal  Other Topics Concern   Not on file  Social History Narrative   Not on file   Social Determinants of Health   Financial Resource Strain: Not on file  Food Insecurity: Not on file  Transportation Needs: Not on file  Physical Activity: Not on file  Stress: Not on file  Social Connections: Not on file  Intimate Partner Violence: Not on file    Family History  Problem Relation Age of Onset   Hypertension Mother    Ovarian cancer Mother    Diabetes type II Mother    Hypertension Father    Kidney disease Father    Hypertension Sister    Diabetes type II Maternal Grandmother    Breast cancer Maternal Grandmother    Breast cancer Maternal Aunt    Hypertension Other    Cancer Other    Renal Disease Other     Allergies  Allergen Reactions    Morphine And Related Hives   Levaquin [Levofloxacin] Itching    Severe itching; prickly sensation   Enalapril Other (See Comments)    hallucinations   Other    Latex Rash   Tape Rash    Review of Systems  All other systems reviewed and are negative.      Objective:   BP 110/62   Pulse 82   Ht 5\' 6"  (1.676 m)   Wt 160 lb (72.6 kg)   SpO2 92%   BMI 25.82 kg/m   Vitals:   10/08/22 0910  BP: 110/62  Pulse: 82  Height: 5\' 6"  (1.676 m)  Weight: 160 lb (72.6 kg)  SpO2: 92%  BMI (Calculated): 25.84    Physical Exam Vitals and nursing note reviewed.  Constitutional:      Appearance: Normal appearance. She is normal weight.  HENT:     Head: Normocephalic.  Eyes:     Pupils: Pupils are equal, round, and reactive to light.  Cardiovascular:     Rate and Rhythm: Normal rate.  Pulmonary:     Effort: Pulmonary effort is normal.  Abdominal:     General: There is distension.  Neurological:     Mental Status: She is alert.      No results found for any visits on 10/08/22.  Recent Results (from the past 2160 hour(s))  Potassium [facility] vascular lab only)     Status: None   Collection Time: 08/20/22 10:37 AM  Result Value Ref Range   Potassium [facility] vascular lab) 4.7 3.5 - 5.1 mmol/L    Comment: Performed at [facility], [address] [place], [place] [address]       Assessment & Plan:   Problem List Items Addressed This Visit       Cardiovascular and Mediastinum   Unstable angina [facility])    Patient is managed by Cardiology and is doing well.  Denies any increase in chest pains today.   Continue current plan of care.       Paroxysmal atrial fibrillation [facility])    Patient well managed by cardiology currently.  Continue current meds Will continue deferring to them for treatment.        Chronic combined systolic and diastolic CHF (congestive heart failure) (HCC) (Chronic)    Patient well managed by cardiology currently.  Continue current  meds Will continue deferring to them for treatment.          Digestive   Other cirrhosis of liver (HCC)    Patient's liver enzymes are checked at dialysis.  She will be sure that they get me these results as is appropriate.        Genitourinary   ESRD on hemodialysis (HCC) (Chronic)    Patient is managed by nephrology and going to dialysis three times weekly.  Will continue to defer treatment to them.         Hematopoietic and Hemostatic   Thrombocytopenia (HCC)    Likely 2/2 her liver.  Will continue monitoring her symptoms going forward and will adjust as needed.       Other Visit Diagnoses     Closed nondisplaced fracture of proximal phalanx of left little finger with routine healing, subsequent encounter    -  Primary   Relevant Orders   Ambulatory referral to Orthopedic Surgery   DG Finger Little Left (Completed)   Physical deconditioning       Relevant Orders   Ambulatory referral to Physical Therapy       Return in about 3 months (around 01/07/2023) for F/U.   Total time spent: 30 minutes  Miki Kins, FNP  10/08/2022

## 2022-10-11 NOTE — Assessment & Plan Note (Signed)
Patient is managed by nephrology and going to dialysis three times weekly.  Will continue to defer treatment to them.

## 2022-10-12 DIAGNOSIS — N2581 Secondary hyperparathyroidism of renal origin: Secondary | ICD-10-CM | POA: Diagnosis not present

## 2022-10-12 DIAGNOSIS — N186 End stage renal disease: Secondary | ICD-10-CM | POA: Diagnosis not present

## 2022-10-12 DIAGNOSIS — Z992 Dependence on renal dialysis: Secondary | ICD-10-CM | POA: Diagnosis not present

## 2022-10-13 DIAGNOSIS — Z992 Dependence on renal dialysis: Secondary | ICD-10-CM | POA: Diagnosis not present

## 2022-10-13 DIAGNOSIS — N186 End stage renal disease: Secondary | ICD-10-CM | POA: Diagnosis not present

## 2022-10-14 DIAGNOSIS — N186 End stage renal disease: Secondary | ICD-10-CM | POA: Diagnosis not present

## 2022-10-14 DIAGNOSIS — Z992 Dependence on renal dialysis: Secondary | ICD-10-CM | POA: Diagnosis not present

## 2022-10-14 DIAGNOSIS — N2581 Secondary hyperparathyroidism of renal origin: Secondary | ICD-10-CM | POA: Diagnosis not present

## 2022-10-16 DIAGNOSIS — N186 End stage renal disease: Secondary | ICD-10-CM | POA: Diagnosis not present

## 2022-10-16 DIAGNOSIS — Z992 Dependence on renal dialysis: Secondary | ICD-10-CM | POA: Diagnosis not present

## 2022-10-16 DIAGNOSIS — N2581 Secondary hyperparathyroidism of renal origin: Secondary | ICD-10-CM | POA: Diagnosis not present

## 2022-10-19 ENCOUNTER — Telehealth (INDEPENDENT_AMBULATORY_CARE_PROVIDER_SITE_OTHER): Payer: Self-pay | Admitting: Nurse Practitioner

## 2022-10-19 ENCOUNTER — Other Ambulatory Visit: Payer: Self-pay | Admitting: Internal Medicine

## 2022-10-19 DIAGNOSIS — Z992 Dependence on renal dialysis: Secondary | ICD-10-CM | POA: Diagnosis not present

## 2022-10-19 DIAGNOSIS — S62647D Nondisplaced fracture of proximal phalanx of left little finger, subsequent encounter for fracture with routine healing: Secondary | ICD-10-CM | POA: Diagnosis not present

## 2022-10-19 DIAGNOSIS — N2581 Secondary hyperparathyroidism of renal origin: Secondary | ICD-10-CM | POA: Diagnosis not present

## 2022-10-19 DIAGNOSIS — R188 Other ascites: Secondary | ICD-10-CM

## 2022-10-19 DIAGNOSIS — N186 End stage renal disease: Secondary | ICD-10-CM | POA: Diagnosis not present

## 2022-10-19 NOTE — Telephone Encounter (Signed)
LVM for pt TCB and schedule appt per Sheppard Plumber, NP recommendations.   per phone encounter, ABI + see JD/FB

## 2022-10-19 NOTE — Telephone Encounter (Signed)
We can bring her in for ABIs.  The discoloration isn't an indication of poor blood flow as we checked her blood flow in January and it was normal.  We also discussed an angiogram and she decided she didn't want to move forward to evaluate her blood flow further.  So we can bring her in to see if anything has changed

## 2022-10-19 NOTE — Telephone Encounter (Signed)
Patient called stating that her left foot has been getting cooler and is staying purplr (states that the discoloration has been for a year ). She is worried. Please advise.

## 2022-10-21 ENCOUNTER — Encounter: Payer: Self-pay | Admitting: Family

## 2022-10-21 ENCOUNTER — Ambulatory Visit (INDEPENDENT_AMBULATORY_CARE_PROVIDER_SITE_OTHER): Payer: 59 | Admitting: Family

## 2022-10-21 VITALS — BP 116/68 | Temp 98.4°F | Ht 66.0 in | Wt 158.6 lb

## 2022-10-21 DIAGNOSIS — Z20822 Contact with and (suspected) exposure to covid-19: Secondary | ICD-10-CM

## 2022-10-21 DIAGNOSIS — N2581 Secondary hyperparathyroidism of renal origin: Secondary | ICD-10-CM | POA: Diagnosis not present

## 2022-10-21 DIAGNOSIS — N186 End stage renal disease: Secondary | ICD-10-CM | POA: Diagnosis not present

## 2022-10-21 DIAGNOSIS — J069 Acute upper respiratory infection, unspecified: Secondary | ICD-10-CM

## 2022-10-21 DIAGNOSIS — Z992 Dependence on renal dialysis: Secondary | ICD-10-CM | POA: Diagnosis not present

## 2022-10-21 MED ORDER — AMOXICILLIN-POT CLAVULANATE 500-125 MG PO TABS: 1.0000 | ORAL_TABLET | Freq: Every day | ORAL | 0 refills | Status: DC

## 2022-10-21 MED ORDER — OXYCODONE-ACETAMINOPHEN 5-325 MG PO TABS: 1.0000 | ORAL_TABLET | Freq: Four times a day (QID) | ORAL | 0 refills | Status: DC | PRN

## 2022-10-21 NOTE — Telephone Encounter (Signed)
LVM for pt TCB and schedule appt  per phone encounter, ABI + see JD/FB

## 2022-10-22 ENCOUNTER — Encounter: Payer: Self-pay | Admitting: Family

## 2022-10-22 ENCOUNTER — Ambulatory Visit
Admission: RE | Admit: 2022-10-22 | Discharge: 2022-10-22 | Disposition: A | Discharge status: Home / Self Care | Payer: 59 | Source: Ambulatory Visit | Attending: Internal Medicine | Admitting: Internal Medicine

## 2022-10-22 DIAGNOSIS — R188 Other ascites: Secondary | ICD-10-CM | POA: Insufficient documentation

## 2022-10-22 DIAGNOSIS — N186 End stage renal disease: Secondary | ICD-10-CM | POA: Diagnosis not present

## 2022-10-22 DIAGNOSIS — E785 Hyperlipidemia, unspecified: Secondary | ICD-10-CM | POA: Diagnosis not present

## 2022-10-22 DIAGNOSIS — K5730 Diverticulosis of large intestine without perforation or abscess without bleeding: Secondary | ICD-10-CM | POA: Diagnosis not present

## 2022-10-22 DIAGNOSIS — R0902 Hypoxemia: Secondary | ICD-10-CM | POA: Diagnosis not present

## 2022-10-22 DIAGNOSIS — I5032 Chronic diastolic (congestive) heart failure: Secondary | ICD-10-CM | POA: Diagnosis not present

## 2022-10-22 DIAGNOSIS — E079 Disorder of thyroid, unspecified: Secondary | ICD-10-CM | POA: Diagnosis not present

## 2022-10-22 DIAGNOSIS — Z539 Procedure and treatment not carried out, unspecified reason: Secondary | ICD-10-CM | POA: Diagnosis not present

## 2022-10-22 DIAGNOSIS — D122 Benign neoplasm of ascending colon: Secondary | ICD-10-CM | POA: Diagnosis not present

## 2022-10-22 DIAGNOSIS — Z794 Long term (current) use of insulin: Secondary | ICD-10-CM | POA: Diagnosis not present

## 2022-10-22 DIAGNOSIS — Z87891 Personal history of nicotine dependence: Secondary | ICD-10-CM | POA: Diagnosis not present

## 2022-10-22 DIAGNOSIS — G4733 Obstructive sleep apnea (adult) (pediatric): Secondary | ICD-10-CM | POA: Diagnosis not present

## 2022-10-22 DIAGNOSIS — E1165 Type 2 diabetes mellitus with hyperglycemia: Secondary | ICD-10-CM | POA: Diagnosis not present

## 2022-10-22 DIAGNOSIS — I110 Hypertensive heart disease with heart failure: Secondary | ICD-10-CM | POA: Diagnosis not present

## 2022-10-22 DIAGNOSIS — K219 Gastro-esophageal reflux disease without esophagitis: Secondary | ICD-10-CM | POA: Diagnosis not present

## 2022-10-22 LAB — COVID-19, FLU A+B AND RSV
Influenza A, NAA: NOT DETECTED
Influenza B, NAA: NOT DETECTED
RSV, NAA: NOT DETECTED
SARS-CoV-2, NAA: NOT DETECTED

## 2022-10-22 LAB — POCT INFLUENZA A/B
Influenza A, POC: NEGATIVE
Influenza B, POC: NEGATIVE

## 2022-10-22 MED ORDER — ALBUMIN HUMAN 25 % IV SOLN
INTRAVENOUS | Status: AC
  Filled 2022-10-22: qty 100

## 2022-10-22 MED ORDER — ALBUMIN HUMAN 25 % IV SOLN: 25.0000 g | Freq: Once | INTRAVENOUS | Status: DC

## 2022-10-22 MED ORDER — ALBUMIN HUMAN 25 % IV SOLN: 12.5000 g | Freq: Once | INTRAVENOUS | Status: DC

## 2022-10-22 MED ORDER — LIDOCAINE HCL (PF) 1 % IJ SOLN
40.0000 mL | Freq: Once | INTRAMUSCULAR | Status: AC
  Administered 2022-10-22: 40 mL via SUBCUTANEOUS
  Filled 2022-10-22: qty 40

## 2022-10-22 NOTE — Progress Notes (Signed)
[name] came to [facility] after paracentesis to have 1 bottle of albumin as ordered from Dr.Taran Singh. After 2 unsuccessful IV attempts, patient decided that she did not want anymore IV attempts and wanted to leave without receiving albumin as ordered. Educated patient, patient was adamant about leaving. Called Dr Doristine Church office and spoke to her nurse and left a message explaining what happened in case MD needed to follow up with patient. Also sent secure chat to MD to update and inform on situation.

## 2022-10-22 NOTE — Procedures (Signed)
PROCEDURE SUMMARY:  Successful US guided paracentesis from left lower quadrant.  Yielded 3.5 L of amber-colored fluid.  No immediate complications.  Pt tolerated well.   Specimen not sent for labs.  EBL < 2 mL  Mickie Kay,  NP 10/22/2022 4:12 PM

## 2022-10-22 NOTE — Progress Notes (Signed)
Nurses tried to insert an IV and patient screamed each time and refused to let anyone else try again. She stated that she had enough and that she felt fine so she didn't need the medication. She called her brother to pick her up. We told her that IV team could use ultrasound to place an IV and she refused. Call made to notify provider.

## 2022-10-22 NOTE — Progress Notes (Signed)
Established Patient Office Visit  Subjective:  Patient ID: [name], female    DOB: September 16, 1960  Age: 62 y.o. MRN: [id number]  Chief Complaint  Patient presents with   Acute Visit    Body aches, congestion, cough, and phlegm.   URI    URI  This is a new problem. The current episode started in the past 7 days (started Saturday Night). The problem has been gradually worsening. Maximum temperature: Fevers, but is unsure of what reading was. The fever has been present for 3 to 4 days. Associated symptoms include congestion, coughing, headaches, joint pain, neck pain, rhinorrhea, sinus pain, sneezing, a sore throat and vomiting. She has tried acetaminophen, decongestant, eating and steam for the symptoms. The treatment provided no relief.    No other concerns at this time.   Past Medical History:  Diagnosis Date   Adult behavior problems    frontal lobe CVA   Anemia    chronic disease   Atrial flutter by electrocardiogram (HCC) 03/22/2014   Cardiomyopathy (HCC)    CHF (congestive heart failure) (HCC)    Chicken pox    Chronic respiratory failure with hypoxia (HCC) 12/19/2021   Coronary artery disease    Delayed surgical wound healing 04/12/2014   Overview:  Left leg   GERD (gastroesophageal reflux disease)    H/O atrial flutter 04/12/2014   H/O stroke without residual deficits 02/13/2014   H/O: substance abuse (HCC) 02/27/2014   Heart failure (HCC)    Hepatitis    history of hep c   History of CVA (cerebrovascular accident) 02/21/2014   History of delirium 04/12/2014   Hyperkalemia 11/13/2014   Hyperlipidemia    Hypertension    Hypotension 02/24/2014   Leukocytosis 02/26/2014   Myocardial infarction [facility])    Obesity    Paroxysmal atrial fibrillation (HCC)    Peripheral vascular disease (HCC)    Postoperative anemia due to acute blood loss 02/27/2014   Renal failure    Renal insufficiency    Stroke [facility]) 2011   Superficial incisional surgical site infection  03/14/2014   Syncope and collapse 07/25/2020    Past Surgical History:  Procedure Laterality Date   A/V FISTULAGRAM Left 03/30/2019   Procedure: A/V FISTULAGRAM;  Surgeon: Annice Needy, MD;  Location: [facility] INVASIVE CV LAB;  Service: Cardiovascular;  Laterality: Left;   A/V FISTULAGRAM Left 10/06/2019   Procedure: A/V FISTULAGRAM;  Surgeon: Annice Needy, MD;  Location: [facility] INVASIVE CV LAB;  Service: Cardiovascular;  Laterality: Left;   A/V FISTULAGRAM Left 05/19/2021   Procedure: A/V FISTULAGRAM;  Surgeon: Annice Needy, MD;  Location: [facility] INVASIVE CV LAB;  Service: Cardiovascular;  Laterality: Left;   A/V FISTULAGRAM Left 01/29/2022   Procedure: A/V Fistulagram;  Surgeon: Annice Needy, MD;  Location: [facility] INVASIVE CV LAB;  Service: Cardiovascular;  Laterality: Left;   A/V FISTULAGRAM Left 04/30/2022   Procedure: A/V Fistulagram;  Surgeon: Annice Needy, MD;  Location: [facility] INVASIVE CV LAB;  Service: Cardiovascular;  Laterality: Left;   A/V FISTULAGRAM Left 08/20/2022   Procedure: A/V Fistulagram;  Surgeon: Annice Needy, MD;  Location: [facility] INVASIVE CV LAB;  Service: Cardiovascular;  Laterality: Left;   COLONOSCOPY     COLONOSCOPY WITH PROPOFOL N/A 04/22/2021   Procedure: COLONOSCOPY WITH PROPOFOL;  Surgeon: Regis Bill, MD;  Location: [facility] ENDOSCOPY;  Service: Endoscopy;  Laterality: N/A;   CORONARY ANGIOPLASTY WITH STENT PLACEMENT  2013   CORONARY ARTERY BYPASS GRAFT  DIALYSIS FISTULA CREATION     ESOPHAGOGASTRODUODENOSCOPY     ESOPHAGOGASTRODUODENOSCOPY N/A 04/22/2021   Procedure: ESOPHAGOGASTRODUODENOSCOPY (EGD);  Surgeon: Regis Bill, MD;  Location: [facility] ENDOSCOPY;  Service: Endoscopy;  Laterality: N/A;   FLEXIBLE SIGMOIDOSCOPY N/A 02/23/2019   Procedure: FLEXIBLE SIGMOIDOSCOPY;  Surgeon: Christena Deem, MD;  Location: [facility] ENDOSCOPY;  Service: Endoscopy;  Laterality: N/A;   LEFT HEART CATH AND CORS/GRAFTS ANGIOGRAPHY N/A 08/08/2018   Procedure: LEFT HEART CATH AND  CORS/GRAFTS ANGIOGRAPHY;  Surgeon: Laurier Nancy, MD;  Location: [facility] INVASIVE CV LAB;  Service: Cardiovascular;  Laterality: N/A;   LEFT HEART CATH AND CORS/GRAFTS ANGIOGRAPHY N/A 01/30/2019   Procedure: LEFT HEART CATH AND CORS/GRAFTS ANGIOGRAPHY;  Surgeon: Laurier Nancy, MD;  Location: [facility] INVASIVE CV LAB;  Service: Cardiovascular;  Laterality: N/A;   percutaneous insertion intra aortic balloon right cath     ultrasound guided pericardiocentesis      Social History   Socioeconomic History   Marital status: Single    Spouse name: Not on file   Number of children: Not on file   Years of education: Not on file   Highest education level: Not on file  Occupational History   Not on file  Tobacco Use   Smoking status: Some Days    Packs/day: 0.25    Years: 20.00    Additional pack years: 0.00    Total pack years: 5.00    Types: Cigarettes   Smokeless tobacco: Never   Tobacco comments:    smokes two cigarettes a day  Vaping Use   Vaping Use: Never used  Substance and Sexual Activity   Alcohol use: No   Drug use: No   Sexual activity: Not Currently    Birth control/protection: Post-menopausal  Other Topics Concern   Not on file  Social History Narrative   Not on file   Social Determinants of Health   Financial Resource Strain: Not on file  Food Insecurity: Not on file  Transportation Needs: Not on file  Physical Activity: Not on file  Stress: Not on file  Social Connections: Not on file  Intimate Partner Violence: Not on file    Family History  Problem Relation Age of Onset   Hypertension Mother    Ovarian cancer Mother    Diabetes type II Mother    Hypertension Father    Kidney disease Father    Hypertension Sister    Diabetes type II Maternal Grandmother    Breast cancer Maternal Grandmother    Breast cancer Maternal Aunt    Hypertension Other    Cancer Other    Renal Disease Other     Allergies  Allergen Reactions   Morphine And Related Hives    Levaquin [Levofloxacin] Itching    Severe itching; prickly sensation   Enalapril Other (See Comments)    hallucinations   Other    Latex Rash   Tape Rash    Review of Systems  Constitutional:  Positive for diaphoresis, fever and malaise/fatigue.  HENT:  Positive for congestion, rhinorrhea, sinus pain, sneezing and sore throat.   Respiratory:  Positive for cough.   Gastrointestinal:  Positive for vomiting.  Musculoskeletal:  Positive for joint pain and neck pain.  Neurological:  Positive for headaches.  All other systems reviewed and are negative.      Objective:   BP 116/68   Temp 98.4 F (36.9 C) (Tympanic)   Ht 5\' 6"  (1.676 m)   Wt 158 lb 9.6 oz (71.9 kg)  BMI 25.60 kg/m   Vitals:   10/21/22 1138  BP: 116/68  Temp: 98.4 F (36.9 C)  Height: 5\' 6"  (1.676 m)  Weight: 158 lb 9.6 oz (71.9 kg)  TempSrc: Tympanic  BMI (Calculated): 25.61    Physical Exam Constitutional:      General: She is awake. She is not in acute distress.    Appearance: She is ill-appearing and diaphoretic.  HENT:     Nose: Mucosal edema, congestion and rhinorrhea present.     Right Turbinates: Enlarged and swollen.     Left Turbinates: Enlarged and swollen.     Right Sinus: Maxillary sinus tenderness and frontal sinus tenderness present.     Left Sinus: Maxillary sinus tenderness and frontal sinus tenderness present.     Mouth/Throat:     Pharynx: Posterior oropharyngeal erythema present.  Pulmonary:     Breath sounds: Rhonchi present.  Psychiatric:        Behavior: Behavior is cooperative.      Results for orders placed or performed in visit on 10/21/22  POCT Influenza A/B  Result Value Ref Range   Influenza A, POC Negative Negative   Influenza B, POC Negative Negative    Recent Results (from the past 2160 hour(s))  Potassium [facility] vascular lab only)     Status: None   Collection Time: 08/20/22 10:37 AM  Result Value Ref Range   Potassium [facility] vascular lab) 4.7 3.5 - 5.1  mmol/L    Comment: Performed at [facility], [address] [place], [place] [address]  POCT Influenza A/B     Status: Normal   Collection Time: 10/22/22  5:37 PM  Result Value Ref Range   Influenza A, POC Negative Negative   Influenza B, POC Negative Negative       Assessment & Plan:   Problem List Items Addressed This Visit   None Visit Diagnoses     Upper respiratory tract infection, unspecified type    -  Primary   Relevant Orders   COVID-19, Flu A+B and RSV   POCT Influenza A/B (Completed)   Contact with and (suspected) exposure to covid-19       Relevant Orders   COVID-19, Flu A+B and RSV       Return as previously scheduled unless not improving..   Total time spent: 20 minutes  Miki Kins, FNP  10/21/2022

## 2022-10-23 DIAGNOSIS — Z992 Dependence on renal dialysis: Secondary | ICD-10-CM | POA: Diagnosis not present

## 2022-10-23 DIAGNOSIS — N2581 Secondary hyperparathyroidism of renal origin: Secondary | ICD-10-CM | POA: Diagnosis not present

## 2022-10-23 DIAGNOSIS — N186 End stage renal disease: Secondary | ICD-10-CM | POA: Diagnosis not present

## 2022-10-24 DIAGNOSIS — I509 Heart failure, unspecified: Secondary | ICD-10-CM | POA: Diagnosis not present

## 2022-10-26 DIAGNOSIS — N2581 Secondary hyperparathyroidism of renal origin: Secondary | ICD-10-CM | POA: Diagnosis not present

## 2022-10-26 DIAGNOSIS — Z992 Dependence on renal dialysis: Secondary | ICD-10-CM | POA: Diagnosis not present

## 2022-10-26 DIAGNOSIS — N186 End stage renal disease: Secondary | ICD-10-CM | POA: Diagnosis not present

## 2022-10-28 DIAGNOSIS — Z992 Dependence on renal dialysis: Secondary | ICD-10-CM | POA: Diagnosis not present

## 2022-10-28 DIAGNOSIS — N2581 Secondary hyperparathyroidism of renal origin: Secondary | ICD-10-CM | POA: Diagnosis not present

## 2022-10-28 DIAGNOSIS — N186 End stage renal disease: Secondary | ICD-10-CM | POA: Diagnosis not present

## 2022-10-30 DIAGNOSIS — N186 End stage renal disease: Secondary | ICD-10-CM | POA: Diagnosis not present

## 2022-10-30 DIAGNOSIS — N2581 Secondary hyperparathyroidism of renal origin: Secondary | ICD-10-CM | POA: Diagnosis not present

## 2022-10-30 DIAGNOSIS — Z992 Dependence on renal dialysis: Secondary | ICD-10-CM | POA: Diagnosis not present

## 2022-11-02 ENCOUNTER — Other Ambulatory Visit: Payer: Self-pay | Admitting: Internal Medicine

## 2022-11-02 DIAGNOSIS — N186 End stage renal disease: Secondary | ICD-10-CM | POA: Diagnosis not present

## 2022-11-02 DIAGNOSIS — Z992 Dependence on renal dialysis: Secondary | ICD-10-CM | POA: Diagnosis not present

## 2022-11-02 DIAGNOSIS — N2581 Secondary hyperparathyroidism of renal origin: Secondary | ICD-10-CM | POA: Diagnosis not present

## 2022-11-02 DIAGNOSIS — R188 Other ascites: Secondary | ICD-10-CM

## 2022-11-03 DIAGNOSIS — R531 Weakness: Secondary | ICD-10-CM | POA: Diagnosis not present

## 2022-11-03 DIAGNOSIS — R2681 Unsteadiness on feet: Secondary | ICD-10-CM | POA: Diagnosis not present

## 2022-11-04 ENCOUNTER — Ambulatory Visit
Admission: RE | Admit: 2022-11-04 | Discharge: 2022-11-04 | Disposition: A | Discharge status: Home / Self Care | Payer: 59 | Source: Ambulatory Visit | Attending: Internal Medicine | Admitting: Internal Medicine

## 2022-11-04 DIAGNOSIS — R188 Other ascites: Secondary | ICD-10-CM | POA: Insufficient documentation

## 2022-11-04 DIAGNOSIS — Z992 Dependence on renal dialysis: Secondary | ICD-10-CM | POA: Diagnosis not present

## 2022-11-04 DIAGNOSIS — N186 End stage renal disease: Secondary | ICD-10-CM | POA: Diagnosis not present

## 2022-11-04 DIAGNOSIS — N2581 Secondary hyperparathyroidism of renal origin: Secondary | ICD-10-CM | POA: Diagnosis not present

## 2022-11-04 NOTE — Procedures (Signed)
PROCEDURE SUMMARY:  Successful US guided paracentesis from left lateral abdomen.  Yielded 3.2 liters of dark, yellow fluid.  No immediate complications.  Pt tolerated well.   Specimen was not sent for labs.  EBL < 5mL  Hoyt Koch PA-C 11/04/2022 11:19 AM

## 2022-11-06 ENCOUNTER — Other Ambulatory Visit (INDEPENDENT_AMBULATORY_CARE_PROVIDER_SITE_OTHER): Payer: Self-pay | Admitting: Nurse Practitioner

## 2022-11-06 DIAGNOSIS — N2581 Secondary hyperparathyroidism of renal origin: Secondary | ICD-10-CM | POA: Diagnosis not present

## 2022-11-06 DIAGNOSIS — Z992 Dependence on renal dialysis: Secondary | ICD-10-CM | POA: Diagnosis not present

## 2022-11-06 DIAGNOSIS — N186 End stage renal disease: Secondary | ICD-10-CM

## 2022-11-06 DIAGNOSIS — L819 Disorder of pigmentation, unspecified: Secondary | ICD-10-CM

## 2022-11-06 DIAGNOSIS — R209 Unspecified disturbances of skin sensation: Secondary | ICD-10-CM

## 2022-11-09 DIAGNOSIS — Z992 Dependence on renal dialysis: Secondary | ICD-10-CM | POA: Diagnosis not present

## 2022-11-09 DIAGNOSIS — N2581 Secondary hyperparathyroidism of renal origin: Secondary | ICD-10-CM | POA: Diagnosis not present

## 2022-11-09 DIAGNOSIS — N186 End stage renal disease: Secondary | ICD-10-CM | POA: Diagnosis not present

## 2022-11-11 DIAGNOSIS — N2581 Secondary hyperparathyroidism of renal origin: Secondary | ICD-10-CM | POA: Diagnosis not present

## 2022-11-11 DIAGNOSIS — Z992 Dependence on renal dialysis: Secondary | ICD-10-CM | POA: Diagnosis not present

## 2022-11-11 DIAGNOSIS — N186 End stage renal disease: Secondary | ICD-10-CM | POA: Diagnosis not present

## 2022-11-12 ENCOUNTER — Encounter (INDEPENDENT_AMBULATORY_CARE_PROVIDER_SITE_OTHER): Payer: Self-pay | Admitting: Nurse Practitioner

## 2022-11-12 ENCOUNTER — Ambulatory Visit (INDEPENDENT_AMBULATORY_CARE_PROVIDER_SITE_OTHER): Payer: 59 | Admitting: Nurse Practitioner

## 2022-11-12 ENCOUNTER — Ambulatory Visit (INDEPENDENT_AMBULATORY_CARE_PROVIDER_SITE_OTHER): Payer: 59

## 2022-11-12 ENCOUNTER — Other Ambulatory Visit: Payer: Self-pay | Admitting: Internal Medicine

## 2022-11-12 VITALS — BP 104/74 | HR 87 | Resp 16 | Wt 165.0 lb

## 2022-11-12 DIAGNOSIS — E785 Hyperlipidemia, unspecified: Secondary | ICD-10-CM

## 2022-11-12 DIAGNOSIS — R188 Other ascites: Secondary | ICD-10-CM

## 2022-11-12 DIAGNOSIS — N186 End stage renal disease: Secondary | ICD-10-CM

## 2022-11-12 DIAGNOSIS — L819 Disorder of pigmentation, unspecified: Secondary | ICD-10-CM | POA: Diagnosis not present

## 2022-11-12 DIAGNOSIS — I10 Essential (primary) hypertension: Secondary | ICD-10-CM | POA: Diagnosis not present

## 2022-11-12 DIAGNOSIS — R209 Unspecified disturbances of skin sensation: Secondary | ICD-10-CM | POA: Diagnosis not present

## 2022-11-12 DIAGNOSIS — R202 Paresthesia of skin: Secondary | ICD-10-CM

## 2022-11-12 DIAGNOSIS — R200 Anesthesia of skin: Secondary | ICD-10-CM | POA: Diagnosis not present

## 2022-11-12 NOTE — Progress Notes (Signed)
Subjective:    Patient ID: [name], female    DOB: [birth year], 62 y.o.   MRN: [id number] Chief Complaint  Patient presents with   Follow-up    Ultrasound follow up    The patient returns to the office for follow up regarding a problem with their dialysis access.   The patient notes a significant increase in bleeding time after decannulation.  The patient has also been informed that there is increased recirculation.    The patient denies hand pain or other symptoms consistent with steal phenomena.  No significant arm swelling.  The patient denies redness or swelling at the access site. The patient denies fever or chills at home or while on dialysis.  The patient is also concerned about some numbness going on her feet.  She notes that she has burning and stinging in her feet and numbness.  She also notes that there is some discoloration but is near the ankle and on the top portion of her foot.  There is no evidence of gangrene or open wounds or ulcerations.  The patient denies amaurosis fugax or recent TIA symptoms. There are no recent neurological changes noted. There is no history of DVT, PE or superficial thrombophlebitis. No recent episodes of angina or shortness of breath documented.   Duplex ultrasound of the AV access shows a patent access.  The previously noted stenosis is  increased compared to last study.  Flow volume today is 1201 cc/min (previous flow volume was 1387 cc/min)  She has ABI of 1.21 on the right and 1.23 on the left with triphasic tibial artery waveforms bilaterally.    Review of Systems  Neurological:  Positive for weakness and numbness.  Hematological:  Bruises/bleeds easily.  All other systems reviewed and are negative.      Objective:   Physical Exam Vitals reviewed.  HENT:     Head: Normocephalic.  Cardiovascular:     Rate and Rhythm: Normal rate.     Pulses:          Dorsalis pedis pulses are 1+ on the right side and 1+ on the left  side.     Arteriovenous access: Left arteriovenous access is present.    Comments: Good thrill and bruit Skin:    General: Skin is warm and dry.  Neurological:     Mental Status: She is alert and oriented to person, place, and time.     Motor: Weakness present.     Gait: Gait abnormal.  Psychiatric:        Mood and Affect: Mood normal.        Behavior: Behavior normal.        Thought Content: Thought content normal.        Judgment: Judgment normal.     BP 104/74 (BP Location: Right Arm)   Pulse 87   Resp 16   Wt 165 lb (74.8 kg)   BMI 26.63 kg/m   Past Medical History:  Diagnosis Date   Adult behavior problems    frontal lobe CVA   Anemia    chronic disease   Atrial flutter by electrocardiogram (HCC) 03/22/2014   Cardiomyopathy (HCC)    CHF (congestive heart failure) (HCC)    Chicken pox    Chronic respiratory failure with hypoxia (HCC) 12/19/2021   Coronary artery disease    Delayed surgical wound healing 04/12/2014   Overview:  Left leg   GERD (gastroesophageal reflux disease)    H/O atrial flutter 04/12/2014  H/O stroke without residual deficits 02/13/2014   H/O: substance abuse (HCC) 02/27/2014   Heart failure (HCC)    Hepatitis    history of hep c   History of CVA (cerebrovascular accident) 02/21/2014   History of delirium 04/12/2014   Hyperkalemia 11/13/2014   Hyperlipidemia    Hypertension    Hypotension 02/24/2014   Leukocytosis 02/26/2014   Myocardial infarction [facility])    Obesity    Paroxysmal atrial fibrillation (HCC)    Peripheral vascular disease (HCC)    Postoperative anemia due to acute blood loss 02/27/2014   Renal failure    Renal insufficiency    Stroke [facility]) 2011   Superficial incisional surgical site infection 03/14/2014   Syncope and collapse 07/25/2020    Social History   Socioeconomic History   Marital status: Single    Spouse name: Not on file   Number of children: Not on file   Years of education: Not on file   Highest  education level: Not on file  Occupational History   Not on file  Tobacco Use   Smoking status: Some Days    Packs/day: 0.25    Years: 20.00    Additional pack years: 0.00    Total pack years: 5.00    Types: Cigarettes   Smokeless tobacco: Never   Tobacco comments:    smokes two cigarettes a day  Vaping Use   Vaping Use: Never used  Substance and Sexual Activity   Alcohol use: No   Drug use: No   Sexual activity: Not Currently    Birth control/protection: Post-menopausal  Other Topics Concern   Not on file  Social History Narrative   Not on file   Social Determinants of Health   Financial Resource Strain: Not on file  Food Insecurity: Not on file  Transportation Needs: Not on file  Physical Activity: Not on file  Stress: Not on file  Social Connections: Not on file  Intimate Partner Violence: Not on file    Past Surgical History:  Procedure Laterality Date   A/V FISTULAGRAM Left 03/30/2019   Procedure: A/V FISTULAGRAM;  Surgeon: Annice Needy, MD;  Location: [facility];  Service: Cardiovascular;  Laterality: Left;   A/V FISTULAGRAM Left 10/06/2019   Procedure: A/V FISTULAGRAM;  Surgeon: Annice Needy, MD;  Location: [facility];  Service: Cardiovascular;  Laterality: Left;   A/V FISTULAGRAM Left 05/19/2021   Procedure: A/V FISTULAGRAM;  Surgeon: Annice Needy, MD;  Location: [facility];  Service: Cardiovascular;  Laterality: Left;   A/V FISTULAGRAM Left 01/29/2022   Procedure: A/V Fistulagram;  Surgeon: Annice Needy, MD;  Location: [facility];  Service: Cardiovascular;  Laterality: Left;   A/V FISTULAGRAM Left 04/30/2022   Procedure: A/V Fistulagram;  Surgeon: Annice Needy, MD;  Location: [facility];  Service: Cardiovascular;  Laterality: Left;   A/V FISTULAGRAM Left 08/20/2022   Procedure: A/V Fistulagram;  Surgeon: Annice Needy, MD;  Location: [facility];  Service: Cardiovascular;  Laterality: Left;   COLONOSCOPY      COLONOSCOPY WITH PROPOFOL N/A 04/22/2021   Procedure: COLONOSCOPY WITH PROPOFOL;  Surgeon: Regis Bill, MD;  Location: [facility]OS[facility];  Service: Endoscopy;  Laterality: N/A;   CORONARY ANGIOPLASTY WITH STENT PLACEMENT  2013   CORONARY ARTERY BYPASS GRAFT     DIALYSIS FISTULA CREATION     ESOPHAGOGASTRODUODENOSCOPY     ESOPHAGOGASTRODUODENOSCOPY N/A 04/22/2021   Procedure: ESOPHAGOGASTRODUODENOSCOPY (EGD);  Surgeon:  Regis Bill, MD;  Location: [facility]OS[facility];  Service: Endoscopy;  Laterality: N/A;   FLEXIBLE SIGMOIDOSCOPY N/A 02/23/2019   Procedure: FLEXIBLE SIGMOIDOSCOPY;  Surgeon: Christena Deem, MD;  Location: [facility];  Service: Endoscopy;  Laterality: N/A;   LEFT HEART CATH AND CORS/GRAFTS ANGIOGRAPHY N/A 08/08/2018   Procedure: LEFT HEART CATH AND CORS/GRAFTS ANGIOGRAPHY;  Surgeon: Laurier Nancy, MD;  Location: [facility];  Service: Cardiovascular;  Laterality: N/A;   LEFT HEART CATH AND CORS/GRAFTS ANGIOGRAPHY N/A 01/30/2019   Procedure: LEFT HEART CATH AND CORS/GRAFTS ANGIOGRAPHY;  Surgeon: Laurier Nancy, MD;  Location: [facility];  Service: Cardiovascular;  Laterality: N/A;   percutaneous insertion intra aortic balloon right cath     ultrasound guided pericardiocentesis      Family History  Problem Relation Age of Onset   Hypertension Mother    Ovarian cancer Mother    Diabetes type II Mother    Hypertension Father    Kidney disease Father    Hypertension Sister    Diabetes type II Maternal Grandmother    Breast cancer Maternal Grandmother    Breast cancer Maternal Aunt    Hypertension Other    Cancer Other    Renal Disease Other     Allergies  Allergen Reactions   Morphine And Codeine Hives   Levaquin [Levofloxacin] Itching    Severe itching; prickly sensation   Enalapril Other (See Comments)    hallucinations   Other    Latex Rash   Tape Rash       Latest Ref Rng & Units 02/16/2022   11:55 PM 02/16/2022    6:42  PM 01/30/2022    9:56 AM  CBC  WBC 4.0 - 10.5 K/uL 6.8  6.5  3.8   Hemoglobin 12.0 - 15.0 g/dL 78.2  95.6  21.3   Hematocrit 36.0 - 46.0 % 34.5  39.9  32.8   Platelets 150 - 400 K/uL 140  146  127       CMP     Component Value Date/Time   NA 138 02/16/2022 2355   NA 139 07/09/2014 0503   K 5.2 (H) 02/16/2022 2355   K 4.7 07/11/2014 0958   CL 102 02/16/2022 2355   CL 98 07/09/2014 0503   CO2 22 02/16/2022 2355   CO2 30 07/09/2014 0503   GLUCOSE 70 02/16/2022 2355   GLUCOSE 107 (H) 07/09/2014 0503   BUN 43 (H) 02/16/2022 2355   BUN 39 (H) 07/09/2014 0503   CREATININE 8.45 (H) 02/16/2022 2355   CREATININE 5.53 (H) 07/09/2014 0503   CALCIUM 8.1 (L) 02/16/2022 2355   CALCIUM 8.3 (L) 07/09/2014 0503   PROT 6.7 02/16/2022 2215   PROT 8.4 (H) 07/07/2014 2207   ALBUMIN 3.5 02/16/2022 2355   ALBUMIN 3.1 (L) 07/07/2014 2207   AST 21 02/16/2022 2215   AST 76 (H) 07/07/2014 2207   ALT 10 02/16/2022 2215   ALT 41 07/07/2014 2207   ALKPHOS 63 02/16/2022 2215   ALKPHOS 107 07/07/2014 2207   BILITOT 1.0 02/16/2022 2215   BILITOT 0.7 07/07/2014 2207   GFRNONAA 5 (L) 02/16/2022 2355   GFRNONAA 9 (L) 07/09/2014 0503   GFRNONAA 9 (L) 02/11/2014 0300   GFRAA 6 (L) 02/06/2020 0708   GFRAA 10 (L) 07/09/2014 0503   GFRAA 10 (L) 02/11/2014 0300     No results found.     Assessment & Plan:   1. ESRD (end stage renal disease) (HCC) Recommend:  The patient is experiencing increasing problems with their dialysis access.  Patient should have a fistulagram with the intention for intervention.  The intention for intervention is to restore appropriate flow and prevent thrombosis and possible loss of the access.  As well as improve the quality of dialysis therapy.  The risks, benefits and alternative therapies were reviewed in detail with the patient.  All questions were answered.  The patient agrees to proceed with angio/intervention.    The patient will follow up with me in the office  after the procedure.   2. Primary hypertension Continue antihypertensive medications as already ordered, these medications have been reviewed and there are no changes at this time.  3. Numbness and tingling The patient's ABIs indicate that she has adequate perfusion bilaterally in fact her ABIs are normal.  Numbness and tingling that she is experiencing is likely related to nerve pain and neuropathy.  She should follow-up with her primary care provider for further workup and evaluation in this instance.  The discoloration that she is describing is actually spider varicosities.  No intervention recommended for her lower extremities.  4. Hyperlipidemia, unspecified hyperlipidemia type Continue statin as ordered and reviewed, no changes at this time   Current Outpatient Medications on File Prior to Visit  Medication Sig Dispense Refill   albuterol (VENTOLIN HFA) 108 (90 Base) MCG/ACT inhaler Inhale into the lungs.     amiodarone (PACERONE) 200 MG tablet Take 200 mg by mouth daily.     amoxicillin-clavulanate (AUGMENTIN) 500-125 MG tablet Take 1 tablet by mouth daily. Take an additional dose after dialysis. 10 tablet 0   apixaban (ELIQUIS) 5 MG TABS tablet Take 5 mg by mouth 2 (two) times daily.     hydrOXYzine (ATARAX/VISTARIL) 50 MG tablet Take 50 mg by mouth at bedtime.     levothyroxine (SYNTHROID) 75 MCG tablet Take 75 mcg by mouth daily before breakfast.      lidocaine-prilocaine (EMLA) cream Apply 1 application topically daily as needed (port access).     midodrine (PROAMATINE) 10 MG tablet Take 1 tablet (10 mg total) by mouth 3 (three) times a week. Monday, Wednesday, Friday with hemodialysis     oxyCODONE-acetaminophen (PERCOCET/ROXICET) 5-325 MG tablet Take 1 tablet by mouth every 6 (six) hours as needed for moderate pain or severe pain. 20 tablet 0   pantoprazole (PROTONIX) 40 MG tablet Take 40 mg by mouth daily.     rosuvastatin (CRESTOR) 20 MG tablet Take 20 mg by mouth at bedtime.      sevelamer carbonate (RENVELA) 800 MG tablet Take 3,200 mg by mouth 3 (three) times daily with meals.     lactulose (CHRONULAC) 10 GM/15ML solution SMARTSIG:Milliliter(s) By Mouth (Patient not taking: Reported on 09/17/2022)     No current facility-administered medications on file prior to visit.    There are no Patient Instructions on file for this visit. No follow-ups on file.   Georgiana Spinner, NP

## 2022-11-12 NOTE — H&P (View-Only) (Signed)
 Subjective:    Patient ID: [name], female    DOB: 05/25/1961, 61 y.o.   MRN: [id number] Chief Complaint  Patient presents with   Follow-up    Ultrasound follow up    The patient returns to the office for follow up regarding a problem with their dialysis access.   The patient notes a significant increase in bleeding time after decannulation.  The patient has also been informed that there is increased recirculation.    The patient denies hand pain or other symptoms consistent with steal phenomena.  No significant arm swelling.  The patient denies redness or swelling at the access site. The patient denies fever or chills at home or while on dialysis.  The patient is also concerned about some numbness going on her feet.  She notes that she has burning and stinging in her feet and numbness.  She also notes that there is some discoloration but is near the ankle and on the top portion of her foot.  There is no evidence of gangrene or open wounds or ulcerations.  The patient denies amaurosis fugax or recent TIA symptoms. There are no recent neurological changes noted. There is no history of DVT, PE or superficial thrombophlebitis. No recent episodes of angina or shortness of breath documented.   Duplex ultrasound of the AV access shows a patent access.  The previously noted stenosis is  increased compared to last study.  Flow volume today is 1201 cc/min (previous flow volume was 1387 cc/min)  She has ABI of 1.21 on the right and 1.23 on the left with triphasic tibial artery waveforms bilaterally.    Review of Systems  Neurological:  Positive for weakness and numbness.  Hematological:  Bruises/bleeds easily.  All other systems reviewed and are negative.      Objective:   Physical Exam Vitals reviewed.  HENT:     Head: Normocephalic.  Cardiovascular:     Rate and Rhythm: Normal rate.     Pulses:          Dorsalis pedis pulses are 1+ on the right side and 1+ on the left  side.     Arteriovenous access: Left arteriovenous access is present.    Comments: Good thrill and bruit Skin:    General: Skin is warm and dry.  Neurological:     Mental Status: She is alert and oriented to person, place, and time.     Motor: Weakness present.     Gait: Gait abnormal.  Psychiatric:        Mood and Affect: Mood normal.        Behavior: Behavior normal.        Thought Content: Thought content normal.        Judgment: Judgment normal.     BP 104/74 (BP Location: Right Arm)   Pulse 87   Resp 16   Wt 165 lb (74.8 kg)   BMI 26.63 kg/m   Past Medical History:  Diagnosis Date   Adult behavior problems    frontal lobe CVA   Anemia    chronic disease   Atrial flutter by electrocardiogram (HCC) 03/22/2014   Cardiomyopathy (HCC)    CHF (congestive heart failure) (HCC)    Chicken pox    Chronic respiratory failure with hypoxia (HCC) 12/19/2021   Coronary artery disease    Delayed surgical wound healing 04/12/2014   Overview:  Left leg   GERD (gastroesophageal reflux disease)    H/O atrial flutter 04/12/2014     H/O stroke without residual deficits 02/13/2014   H/O: substance abuse (HCC) 02/27/2014   Heart failure (HCC)    Hepatitis    history of hep c   History of CVA (cerebrovascular accident) 02/21/2014   History of delirium 04/12/2014   Hyperkalemia 11/13/2014   Hyperlipidemia    Hypertension    Hypotension 02/24/2014   Leukocytosis 02/26/2014   Myocardial infarction (HCC)    Obesity    Paroxysmal atrial fibrillation (HCC)    Peripheral vascular disease (HCC)    Postoperative anemia due to acute blood loss 02/27/2014   Renal failure    Renal insufficiency    Stroke (HCC) 2011   Superficial incisional surgical site infection 03/14/2014   Syncope and collapse 07/25/2020    Social History   Socioeconomic History   Marital status: Single    Spouse name: Not on file   Number of children: Not on file   Years of education: Not on file   Highest  education level: Not on file  Occupational History   Not on file  Tobacco Use   Smoking status: Some Days    Packs/day: 0.25    Years: 20.00    Additional pack years: 0.00    Total pack years: 5.00    Types: Cigarettes   Smokeless tobacco: Never   Tobacco comments:    smokes two cigarettes a day  Vaping Use   Vaping Use: Never used  Substance and Sexual Activity   Alcohol use: No   Drug use: No   Sexual activity: Not Currently    Birth control/protection: Post-menopausal  Other Topics Concern   Not on file  Social History Narrative   Not on file   Social Determinants of Health   Financial Resource Strain: Not on file  Food Insecurity: Not on file  Transportation Needs: Not on file  Physical Activity: Not on file  Stress: Not on file  Social Connections: Not on file  Intimate Partner Violence: Not on file    Past Surgical History:  Procedure Laterality Date   A/V FISTULAGRAM Left 03/30/2019   Procedure: A/V FISTULAGRAM;  Surgeon: Dew, Jason S, MD;  Location: [facility];  Service: Cardiovascular;  Laterality: Left;   A/V FISTULAGRAM Left 10/06/2019   Procedure: A/V FISTULAGRAM;  Surgeon: Dew, Jason S, MD;  Location: [facility];  Service: Cardiovascular;  Laterality: Left;   A/V FISTULAGRAM Left 05/19/2021   Procedure: A/V FISTULAGRAM;  Surgeon: Dew, Jason S, MD;  Location: [facility];  Service: Cardiovascular;  Laterality: Left;   A/V FISTULAGRAM Left 01/29/2022   Procedure: A/V Fistulagram;  Surgeon: Dew, Jason S, MD;  Location: [facility];  Service: Cardiovascular;  Laterality: Left;   A/V FISTULAGRAM Left 04/30/2022   Procedure: A/V Fistulagram;  Surgeon: Dew, Jason S, MD;  Location: [facility];  Service: Cardiovascular;  Laterality: Left;   A/V FISTULAGRAM Left 08/20/2022   Procedure: A/V Fistulagram;  Surgeon: Dew, Jason S, MD;  Location: [facility];  Service: Cardiovascular;  Laterality: Left;   COLONOSCOPY      COLONOSCOPY WITH PROPOFOL N/A 04/22/2021   Procedure: COLONOSCOPY WITH PROPOFOL;  Surgeon: Locklear, Cameron T, MD;  Location: [facility];  Service: Endoscopy;  Laterality: N/A;   CORONARY ANGIOPLASTY WITH STENT PLACEMENT  2013   CORONARY ARTERY BYPASS GRAFT     DIALYSIS FISTULA CREATION     ESOPHAGOGASTRODUODENOSCOPY     ESOPHAGOGASTRODUODENOSCOPY N/A 04/22/2021   Procedure: ESOPHAGOGASTRODUODENOSCOPY (EGD);  Surgeon:   Locklear, Cameron T, MD;  Location: [facility];  Service: Endoscopy;  Laterality: N/A;   FLEXIBLE SIGMOIDOSCOPY N/A 02/23/2019   Procedure: FLEXIBLE SIGMOIDOSCOPY;  Surgeon: Skulskie, Martin U, MD;  Location: [facility];  Service: Endoscopy;  Laterality: N/A;   LEFT HEART CATH AND CORS/GRAFTS ANGIOGRAPHY N/A 08/08/2018   Procedure: LEFT HEART CATH AND CORS/GRAFTS ANGIOGRAPHY;  Surgeon: Khan, Shaukat A, MD;  Location: [facility];  Service: Cardiovascular;  Laterality: N/A;   LEFT HEART CATH AND CORS/GRAFTS ANGIOGRAPHY N/A 01/30/2019   Procedure: LEFT HEART CATH AND CORS/GRAFTS ANGIOGRAPHY;  Surgeon: Khan, Shaukat A, MD;  Location: [facility];  Service: Cardiovascular;  Laterality: N/A;   percutaneous insertion intra aortic balloon right cath     ultrasound guided pericardiocentesis      Family History  Problem Relation Age of Onset   Hypertension Mother    Ovarian cancer Mother    Diabetes type II Mother    Hypertension Father    Kidney disease Father    Hypertension Sister    Diabetes type II Maternal Grandmother    Breast cancer Maternal Grandmother    Breast cancer Maternal Aunt    Hypertension Other    Cancer Other    Renal Disease Other     Allergies  Allergen Reactions   Morphine And Codeine Hives   Levaquin [Levofloxacin] Itching    Severe itching; prickly sensation   Enalapril Other (See Comments)    hallucinations   Other    Latex Rash   Tape Rash       Latest Ref Rng & Units 02/16/2022   11:55 PM 02/16/2022    6:42  PM 01/30/2022    9:56 AM  CBC  WBC 4.0 - 10.5 K/uL 6.8  6.5  3.8   Hemoglobin 12.0 - 15.0 g/dL 11.0  12.6  10.1   Hematocrit 36.0 - 46.0 % 34.5  39.9  32.8   Platelets 150 - 400 K/uL 140  146  127       CMP     Component Value Date/Time   NA 138 02/16/2022 2355   NA 139 07/09/2014 0503   K 5.2 (H) 02/16/2022 2355   K 4.7 07/11/2014 0958   CL 102 02/16/2022 2355   CL 98 07/09/2014 0503   CO2 22 02/16/2022 2355   CO2 30 07/09/2014 0503   GLUCOSE 70 02/16/2022 2355   GLUCOSE 107 (H) 07/09/2014 0503   BUN 43 (H) 02/16/2022 2355   BUN 39 (H) 07/09/2014 0503   CREATININE 8.45 (H) 02/16/2022 2355   CREATININE 5.53 (H) 07/09/2014 0503   CALCIUM 8.1 (L) 02/16/2022 2355   CALCIUM 8.3 (L) 07/09/2014 0503   PROT 6.7 02/16/2022 2215   PROT 8.4 (H) 07/07/2014 2207   ALBUMIN 3.5 02/16/2022 2355   ALBUMIN 3.1 (L) 07/07/2014 2207   AST 21 02/16/2022 2215   AST 76 (H) 07/07/2014 2207   ALT 10 02/16/2022 2215   ALT 41 07/07/2014 2207   ALKPHOS 63 02/16/2022 2215   ALKPHOS 107 07/07/2014 2207   BILITOT 1.0 02/16/2022 2215   BILITOT 0.7 07/07/2014 2207   GFRNONAA 5 (L) 02/16/2022 2355   GFRNONAA 9 (L) 07/09/2014 0503   GFRNONAA 9 (L) 02/11/2014 0300   GFRAA 6 (L) 02/06/2020 0708   GFRAA 10 (L) 07/09/2014 0503   GFRAA 10 (L) 02/11/2014 0300     No results found.     Assessment & Plan:   1. ESRD (end stage renal disease) (HCC) Recommend:    The patient is experiencing increasing problems with their dialysis access.  Patient should have a fistulagram with the intention for intervention.  The intention for intervention is to restore appropriate flow and prevent thrombosis and possible loss of the access.  As well as improve the quality of dialysis therapy.  The risks, benefits and alternative therapies were reviewed in detail with the patient.  All questions were answered.  The patient agrees to proceed with angio/intervention.    The patient will follow up with me in the office  after the procedure.   2. Primary hypertension Continue antihypertensive medications as already ordered, these medications have been reviewed and there are no changes at this time.  3. Numbness and tingling The patient's ABIs indicate that she has adequate perfusion bilaterally in fact her ABIs are normal.  Numbness and tingling that she is experiencing is likely related to nerve pain and neuropathy.  She should follow-up with her primary care provider for further workup and evaluation in this instance.  The discoloration that she is describing is actually spider varicosities.  No intervention recommended for her lower extremities.  4. Hyperlipidemia, unspecified hyperlipidemia type Continue statin as ordered and reviewed, no changes at this time   Current Outpatient Medications on File Prior to Visit  Medication Sig Dispense Refill   albuterol (VENTOLIN HFA) 108 (90 Base) MCG/ACT inhaler Inhale into the lungs.     amiodarone (PACERONE) 200 MG tablet Take 200 mg by mouth daily.     amoxicillin-clavulanate (AUGMENTIN) 500-125 MG tablet Take 1 tablet by mouth daily. Take an additional dose after dialysis. 10 tablet 0   apixaban (ELIQUIS) 5 MG TABS tablet Take 5 mg by mouth 2 (two) times daily.     hydrOXYzine (ATARAX/VISTARIL) 50 MG tablet Take 50 mg by mouth at bedtime.     levothyroxine (SYNTHROID) 75 MCG tablet Take 75 mcg by mouth daily before breakfast.      lidocaine-prilocaine (EMLA) cream Apply 1 application topically daily as needed (port access).     midodrine (PROAMATINE) 10 MG tablet Take 1 tablet (10 mg total) by mouth 3 (three) times a week. Monday, Wednesday, Friday with hemodialysis     oxyCODONE-acetaminophen (PERCOCET/ROXICET) 5-325 MG tablet Take 1 tablet by mouth every 6 (six) hours as needed for moderate pain or severe pain. 20 tablet 0   pantoprazole (PROTONIX) 40 MG tablet Take 40 mg by mouth daily.     rosuvastatin (CRESTOR) 20 MG tablet Take 20 mg by mouth at bedtime.      sevelamer carbonate (RENVELA) 800 MG tablet Take 3,200 mg by mouth 3 (three) times daily with meals.     lactulose (CHRONULAC) 10 GM/15ML solution SMARTSIG:Milliliter(s) By Mouth (Patient not taking: Reported on 09/17/2022)     No current facility-administered medications on file prior to visit.    There are no Patient Instructions on file for this visit. No follow-ups on file.   Cedrick Partain E September Mormile, NP   

## 2022-11-13 DIAGNOSIS — R531 Weakness: Secondary | ICD-10-CM | POA: Diagnosis not present

## 2022-11-13 DIAGNOSIS — R2681 Unsteadiness on feet: Secondary | ICD-10-CM | POA: Diagnosis not present

## 2022-11-13 DIAGNOSIS — Z992 Dependence on renal dialysis: Secondary | ICD-10-CM | POA: Diagnosis not present

## 2022-11-13 DIAGNOSIS — N186 End stage renal disease: Secondary | ICD-10-CM | POA: Diagnosis not present

## 2022-11-13 DIAGNOSIS — N2581 Secondary hyperparathyroidism of renal origin: Secondary | ICD-10-CM | POA: Diagnosis not present

## 2022-11-13 LAB — VAS US ABI WITH/WO TBI
Left ABI: 1.23
Right ABI: 1.21

## 2022-11-16 DIAGNOSIS — Z992 Dependence on renal dialysis: Secondary | ICD-10-CM | POA: Diagnosis not present

## 2022-11-16 DIAGNOSIS — N186 End stage renal disease: Secondary | ICD-10-CM | POA: Diagnosis not present

## 2022-11-16 DIAGNOSIS — N2581 Secondary hyperparathyroidism of renal origin: Secondary | ICD-10-CM | POA: Diagnosis not present

## 2022-11-18 ENCOUNTER — Ambulatory Visit
Admission: RE | Admit: 2022-11-18 | Discharge: 2022-11-18 | Disposition: A | Discharge status: Home / Self Care | Payer: 59 | Source: Ambulatory Visit | Attending: Internal Medicine | Admitting: Internal Medicine

## 2022-11-18 DIAGNOSIS — N2581 Secondary hyperparathyroidism of renal origin: Secondary | ICD-10-CM | POA: Diagnosis not present

## 2022-11-18 DIAGNOSIS — R188 Other ascites: Secondary | ICD-10-CM | POA: Insufficient documentation

## 2022-11-18 DIAGNOSIS — N186 End stage renal disease: Secondary | ICD-10-CM | POA: Diagnosis not present

## 2022-11-18 DIAGNOSIS — Z992 Dependence on renal dialysis: Secondary | ICD-10-CM | POA: Diagnosis not present

## 2022-11-18 MED ORDER — LIDOCAINE HCL (PF) 1 % IJ SOLN
20.0000 mL | Freq: Once | INTRAMUSCULAR | Status: AC
  Administered 2022-11-18: 20 mL via SUBCUTANEOUS
  Filled 2022-11-18: qty 20

## 2022-11-18 NOTE — Procedures (Signed)
PROCEDURE SUMMARY:  Successful image-guided paracentesis from the right lower abdomen.  Yielded 2.2 liters of cloudy amber fluid.  No immediate complications.  EBL = trace. Patient tolerated well.   Specimen was not sent for labs.  Please see imaging section of Epic for full dictation.   Kennieth Francois PA-C 11/18/2022 12:22 PM

## 2022-11-19 DIAGNOSIS — R2681 Unsteadiness on feet: Secondary | ICD-10-CM | POA: Diagnosis not present

## 2022-11-19 DIAGNOSIS — S62647D Nondisplaced fracture of proximal phalanx of left little finger, subsequent encounter for fracture with routine healing: Secondary | ICD-10-CM | POA: Diagnosis not present

## 2022-11-19 DIAGNOSIS — R531 Weakness: Secondary | ICD-10-CM | POA: Diagnosis not present

## 2022-11-20 DIAGNOSIS — N2581 Secondary hyperparathyroidism of renal origin: Secondary | ICD-10-CM | POA: Diagnosis not present

## 2022-11-20 DIAGNOSIS — Z992 Dependence on renal dialysis: Secondary | ICD-10-CM | POA: Diagnosis not present

## 2022-11-20 DIAGNOSIS — N186 End stage renal disease: Secondary | ICD-10-CM | POA: Diagnosis not present

## 2022-11-23 DIAGNOSIS — N186 End stage renal disease: Secondary | ICD-10-CM | POA: Diagnosis not present

## 2022-11-23 DIAGNOSIS — Z992 Dependence on renal dialysis: Secondary | ICD-10-CM | POA: Diagnosis not present

## 2022-11-23 DIAGNOSIS — N2581 Secondary hyperparathyroidism of renal origin: Secondary | ICD-10-CM | POA: Diagnosis not present

## 2022-11-24 DIAGNOSIS — R531 Weakness: Secondary | ICD-10-CM | POA: Diagnosis not present

## 2022-11-24 DIAGNOSIS — I509 Heart failure, unspecified: Secondary | ICD-10-CM | POA: Diagnosis not present

## 2022-11-24 DIAGNOSIS — R2681 Unsteadiness on feet: Secondary | ICD-10-CM | POA: Diagnosis not present

## 2022-11-25 DIAGNOSIS — N2581 Secondary hyperparathyroidism of renal origin: Secondary | ICD-10-CM | POA: Diagnosis not present

## 2022-11-25 DIAGNOSIS — N186 End stage renal disease: Secondary | ICD-10-CM | POA: Diagnosis not present

## 2022-11-25 DIAGNOSIS — Z992 Dependence on renal dialysis: Secondary | ICD-10-CM | POA: Diagnosis not present

## 2022-11-26 DIAGNOSIS — R531 Weakness: Secondary | ICD-10-CM | POA: Diagnosis not present

## 2022-11-26 DIAGNOSIS — R2681 Unsteadiness on feet: Secondary | ICD-10-CM | POA: Diagnosis not present

## 2022-11-27 ENCOUNTER — Ambulatory Visit (INDEPENDENT_AMBULATORY_CARE_PROVIDER_SITE_OTHER): Payer: 59 | Admitting: Family

## 2022-11-27 ENCOUNTER — Ambulatory Visit
Admission: RE | Admit: 2022-11-27 | Discharge: 2022-11-27 | Disposition: A | Discharge status: Home / Self Care | Payer: 59 | Source: Ambulatory Visit | Attending: Family | Admitting: Family

## 2022-11-27 ENCOUNTER — Telehealth (INDEPENDENT_AMBULATORY_CARE_PROVIDER_SITE_OTHER): Payer: Self-pay

## 2022-11-27 ENCOUNTER — Ambulatory Visit
Admission: RE | Admit: 2022-11-27 | Discharge: 2022-11-27 | Disposition: A | Discharge status: Home / Self Care | Payer: 59 | Attending: Family | Admitting: Family

## 2022-11-27 ENCOUNTER — Encounter: Payer: Self-pay | Admitting: Internal Medicine

## 2022-11-27 VITALS — BP 112/78 | HR 64 | Ht 66.0 in | Wt 153.4 lb

## 2022-11-27 DIAGNOSIS — M7989 Other specified soft tissue disorders: Secondary | ICD-10-CM | POA: Diagnosis not present

## 2022-11-27 DIAGNOSIS — M79672 Pain in left foot: Secondary | ICD-10-CM

## 2022-11-27 DIAGNOSIS — N2581 Secondary hyperparathyroidism of renal origin: Secondary | ICD-10-CM | POA: Diagnosis not present

## 2022-11-27 DIAGNOSIS — M2011 Hallux valgus (acquired), right foot: Secondary | ICD-10-CM

## 2022-11-27 DIAGNOSIS — N186 End stage renal disease: Secondary | ICD-10-CM | POA: Diagnosis not present

## 2022-11-27 DIAGNOSIS — Z992 Dependence on renal dialysis: Secondary | ICD-10-CM | POA: Diagnosis not present

## 2022-11-27 MED ORDER — OXYCODONE-ACETAMINOPHEN 5-325 MG PO TABS: 1.0000 | ORAL_TABLET | Freq: Four times a day (QID) | ORAL | 0 refills | Status: AC | PRN

## 2022-11-27 NOTE — Telephone Encounter (Signed)
Spoke with the patient and she is scheduled with Dr. Wyn Quaker on 12/03/22 with a 11:00 am at the [facility]. Pre-procedure instructions were discussed and will be mailed.

## 2022-11-30 ENCOUNTER — Other Ambulatory Visit: Payer: Self-pay | Admitting: Internal Medicine

## 2022-11-30 DIAGNOSIS — R188 Other ascites: Secondary | ICD-10-CM

## 2022-11-30 DIAGNOSIS — N2581 Secondary hyperparathyroidism of renal origin: Secondary | ICD-10-CM | POA: Diagnosis not present

## 2022-11-30 DIAGNOSIS — Z992 Dependence on renal dialysis: Secondary | ICD-10-CM | POA: Diagnosis not present

## 2022-11-30 DIAGNOSIS — N186 End stage renal disease: Secondary | ICD-10-CM | POA: Diagnosis not present

## 2022-12-02 DIAGNOSIS — M10072 Idiopathic gout, left ankle and foot: Secondary | ICD-10-CM | POA: Diagnosis not present

## 2022-12-02 DIAGNOSIS — N186 End stage renal disease: Secondary | ICD-10-CM | POA: Diagnosis not present

## 2022-12-02 DIAGNOSIS — S90122A Contusion of left lesser toe(s) without damage to nail, initial encounter: Secondary | ICD-10-CM | POA: Diagnosis not present

## 2022-12-02 DIAGNOSIS — M2011 Hallux valgus (acquired), right foot: Secondary | ICD-10-CM | POA: Diagnosis not present

## 2022-12-02 DIAGNOSIS — M2012 Hallux valgus (acquired), left foot: Secondary | ICD-10-CM | POA: Diagnosis not present

## 2022-12-02 DIAGNOSIS — Z992 Dependence on renal dialysis: Secondary | ICD-10-CM | POA: Diagnosis not present

## 2022-12-02 DIAGNOSIS — N2581 Secondary hyperparathyroidism of renal origin: Secondary | ICD-10-CM | POA: Diagnosis not present

## 2022-12-02 DIAGNOSIS — S9032XA Contusion of left foot, initial encounter: Secondary | ICD-10-CM | POA: Diagnosis not present

## 2022-12-03 ENCOUNTER — Encounter
Admission: RE | Disposition: A | Discharge status: Home / Self Care | Payer: Self-pay | Source: Home / Self Care | Attending: Vascular Surgery

## 2022-12-03 ENCOUNTER — Ambulatory Visit: Payer: 59 | Admitting: Certified Registered"

## 2022-12-03 ENCOUNTER — Ambulatory Visit: Payer: 59 | Admitting: Cardiovascular Disease

## 2022-12-03 ENCOUNTER — Encounter: Payer: Self-pay | Admitting: Vascular Surgery

## 2022-12-03 ENCOUNTER — Other Ambulatory Visit: Payer: Self-pay

## 2022-12-03 ENCOUNTER — Ambulatory Visit
Admission: RE | Admit: 2022-12-03 | Discharge: 2022-12-03 | Disposition: A | Discharge status: Home / Self Care | Payer: 59 | Attending: Vascular Surgery | Admitting: Vascular Surgery

## 2022-12-03 DIAGNOSIS — D631 Anemia in chronic kidney disease: Secondary | ICD-10-CM | POA: Diagnosis not present

## 2022-12-03 DIAGNOSIS — I5042 Chronic combined systolic (congestive) and diastolic (congestive) heart failure: Secondary | ICD-10-CM | POA: Diagnosis not present

## 2022-12-03 DIAGNOSIS — I480 Paroxysmal atrial fibrillation: Secondary | ICD-10-CM | POA: Diagnosis not present

## 2022-12-03 DIAGNOSIS — Z992 Dependence on renal dialysis: Secondary | ICD-10-CM | POA: Insufficient documentation

## 2022-12-03 DIAGNOSIS — I25119 Atherosclerotic heart disease of native coronary artery with unspecified angina pectoris: Secondary | ICD-10-CM | POA: Diagnosis not present

## 2022-12-03 DIAGNOSIS — I120 Hypertensive chronic kidney disease with stage 5 chronic kidney disease or end stage renal disease: Secondary | ICD-10-CM | POA: Diagnosis not present

## 2022-12-03 DIAGNOSIS — I132 Hypertensive heart and chronic kidney disease with heart failure and with stage 5 chronic kidney disease, or end stage renal disease: Secondary | ICD-10-CM | POA: Diagnosis not present

## 2022-12-03 DIAGNOSIS — E785 Hyperlipidemia, unspecified: Secondary | ICD-10-CM | POA: Diagnosis not present

## 2022-12-03 DIAGNOSIS — F17210 Nicotine dependence, cigarettes, uncomplicated: Secondary | ICD-10-CM | POA: Insufficient documentation

## 2022-12-03 DIAGNOSIS — T82858A Stenosis of vascular prosthetic devices, implants and grafts, initial encounter: Secondary | ICD-10-CM | POA: Diagnosis not present

## 2022-12-03 DIAGNOSIS — Y841 Kidney dialysis as the cause of abnormal reaction of the patient, or of later complication, without mention of misadventure at the time of the procedure: Secondary | ICD-10-CM | POA: Diagnosis not present

## 2022-12-03 DIAGNOSIS — N186 End stage renal disease: Secondary | ICD-10-CM | POA: Insufficient documentation

## 2022-12-03 DIAGNOSIS — R200 Anesthesia of skin: Secondary | ICD-10-CM | POA: Insufficient documentation

## 2022-12-03 HISTORY — PX: A/V FISTULAGRAM: CATH118298

## 2022-12-03 LAB — POTASSIUM (ARMC VASCULAR LAB ONLY): Potassium (ARMC vascular lab): 3.6 mmol/L (ref 3.5–5.1)

## 2022-12-03 SURGERY — A/V FISTULAGRAM
Anesthesia: General

## 2022-12-03 MED ORDER — DIPHENHYDRAMINE HCL 50 MG/ML IJ SOLN: 50.0000 mg | Freq: Once | INTRAMUSCULAR | Status: DC | PRN

## 2022-12-03 MED ORDER — METHYLPREDNISOLONE SODIUM SUCC 125 MG IJ SOLR: 125.0000 mg | Freq: Once | INTRAMUSCULAR | Status: DC | PRN

## 2022-12-03 MED ORDER — HEPARIN SODIUM (PORCINE) 1000 UNIT/ML IJ SOLN
INTRAMUSCULAR | Status: DC | PRN
  Administered 2022-12-03: 3000 [IU] via INTRAVENOUS

## 2022-12-03 MED ORDER — ONDANSETRON HCL 4 MG/2ML IJ SOLN: 4.0000 mg | Freq: Four times a day (QID) | INTRAMUSCULAR | Status: DC | PRN

## 2022-12-03 MED ORDER — FENTANYL CITRATE (PF) 100 MCG/2ML IJ SOLN
INTRAMUSCULAR | Status: AC
  Filled 2022-12-03: qty 2

## 2022-12-03 MED ORDER — CEFAZOLIN SODIUM-DEXTROSE 1-4 GM/50ML-% IV SOLN
1.0000 g | INTRAVENOUS | Status: AC
  Administered 2022-12-03: 1 g via INTRAVENOUS

## 2022-12-03 MED ORDER — IPRATROPIUM-ALBUTEROL 0.5-2.5 (3) MG/3ML IN SOLN
RESPIRATORY_TRACT | Status: AC
  Filled 2022-12-03: qty 3

## 2022-12-03 MED ORDER — OXYCODONE HCL 5 MG PO TABS: 5.0000 mg | ORAL_TABLET | Freq: Once | ORAL | Status: DC | PRN

## 2022-12-03 MED ORDER — FENTANYL CITRATE (PF) 100 MCG/2ML IJ SOLN: 25.0000 ug | INTRAMUSCULAR | Status: DC | PRN

## 2022-12-03 MED ORDER — IPRATROPIUM-ALBUTEROL 0.5-2.5 (3) MG/3ML IN SOLN
3.0000 mL | Freq: Once | RESPIRATORY_TRACT | Status: AC
  Administered 2022-12-03: 3 mL via RESPIRATORY_TRACT

## 2022-12-03 MED ORDER — PHENYLEPHRINE HCL (PRESSORS) 10 MG/ML IV SOLN
INTRAVENOUS | Status: DC | PRN
  Administered 2022-12-03: 100 ug via INTRAVENOUS

## 2022-12-03 MED ORDER — ONDANSETRON HCL 4 MG/2ML IJ SOLN
INTRAMUSCULAR | Status: AC
  Filled 2022-12-03: qty 2

## 2022-12-03 MED ORDER — EPHEDRINE 5 MG/ML INJ
INTRAVENOUS | Status: AC
  Filled 2022-12-03: qty 5

## 2022-12-03 MED ORDER — IODIXANOL 320 MG/ML IV SOLN
INTRAVENOUS | Status: DC | PRN
  Administered 2022-12-03: 20 mL via INTRAVENOUS

## 2022-12-03 MED ORDER — CEFAZOLIN SODIUM-DEXTROSE 1-4 GM/50ML-% IV SOLN
INTRAVENOUS | Status: AC
  Filled 2022-12-03: qty 50

## 2022-12-03 MED ORDER — EPHEDRINE SULFATE (PRESSORS) 50 MG/ML IJ SOLN
INTRAMUSCULAR | Status: DC | PRN
  Administered 2022-12-03: 5 mg via INTRAVENOUS

## 2022-12-03 MED ORDER — HEPARIN (PORCINE) IN NACL 1000-0.9 UT/500ML-% IV SOLN
INTRAVENOUS | Status: DC | PRN
  Administered 2022-12-03: 1000 mL

## 2022-12-03 MED ORDER — HYDROCOD POLI-CHLORPHE POLI ER 10-8 MG/5ML PO SUER
ORAL | Status: AC
  Administered 2022-12-03: 5 mL via ORAL
  Filled 2022-12-03: qty 5

## 2022-12-03 MED ORDER — OXYCODONE HCL 5 MG/5ML PO SOLN: 5.0000 mg | Freq: Once | ORAL | Status: DC | PRN

## 2022-12-03 MED ORDER — PHENYLEPHRINE 80 MCG/ML (10ML) SYRINGE FOR IV PUSH (FOR BLOOD PRESSURE SUPPORT)
PREFILLED_SYRINGE | INTRAVENOUS | Status: AC
  Filled 2022-12-03: qty 10

## 2022-12-03 MED ORDER — FAMOTIDINE 20 MG PO TABS: 40.0000 mg | ORAL_TABLET | Freq: Once | ORAL | Status: DC | PRN

## 2022-12-03 MED ORDER — LIDOCAINE HCL (CARDIAC) PF 100 MG/5ML IV SOSY
PREFILLED_SYRINGE | INTRAVENOUS | Status: DC | PRN
  Administered 2022-12-03: 60 mg via INTRAVENOUS

## 2022-12-03 MED ORDER — HYDROMORPHONE HCL 1 MG/ML IJ SOLN: 1.0000 mg | Freq: Once | INTRAMUSCULAR | Status: DC | PRN

## 2022-12-03 MED ORDER — HYDROCOD POLI-CHLORPHE POLI ER 10-8 MG/5ML PO SUER: 5.0000 mL | Freq: Once | ORAL | Status: AC

## 2022-12-03 MED ORDER — PROPOFOL 1000 MG/100ML IV EMUL
INTRAVENOUS | Status: AC
  Filled 2022-12-03: qty 100

## 2022-12-03 MED ORDER — LIDOCAINE HCL (PF) 2 % IJ SOLN
INTRAMUSCULAR | Status: AC
  Filled 2022-12-03: qty 5

## 2022-12-03 MED ORDER — FENTANYL CITRATE (PF) 100 MCG/2ML IJ SOLN
INTRAMUSCULAR | Status: DC | PRN
  Administered 2022-12-03: 25 ug via INTRAVENOUS

## 2022-12-03 MED ORDER — SODIUM CHLORIDE 0.9 % IV SOLN: INTRAVENOUS | Status: DC

## 2022-12-03 MED ORDER — PROPOFOL 500 MG/50ML IV EMUL
INTRAVENOUS | Status: DC | PRN
  Administered 2022-12-03: 100 ug/kg/min via INTRAVENOUS

## 2022-12-03 MED ORDER — MIDAZOLAM HCL 2 MG/ML PO SYRP: 8.0000 mg | ORAL_SOLUTION | Freq: Once | ORAL | Status: DC | PRN

## 2022-12-03 SURGICAL SUPPLY — 12 items
BALLN LUTONIX AV 8X60X75 (BALLOONS) ×1
BALLN LUTONIX AV 9X40X75 (BALLOONS) ×1
BALLOON LUTONIX AV 8X60X75 (BALLOONS) IMPLANT
BALLOON LUTONIX AV 9X40X75 (BALLOONS) IMPLANT
CANNULA 5F STIFF (CANNULA) IMPLANT
COVER PROBE ULTRASOUND 5X96 (MISCELLANEOUS) IMPLANT
DRAPE BRACHIAL (DRAPES) IMPLANT
KIT ENCORE 26 ADVANTAGE (KITS) IMPLANT
PACK ANGIOGRAPHY (CUSTOM PROCEDURE TRAY) ×1 IMPLANT
SHEATH BRITE TIP 6FRX5.5 (SHEATH) IMPLANT
SUT MNCRL AB 4-0 PS2 18 (SUTURE) IMPLANT
WIRE SUPRACORE 190CM (WIRE) IMPLANT

## 2022-12-03 NOTE — Interval H&P Note (Signed)
History and Physical Interval Note:  12/03/2022 10:44 AM  [name]  has presented today for surgery, with the diagnosis of L arm fistulagram   ANESTHESIA     End Stage Renal.  The various methods of treatment have been discussed with the patient and family. After consideration of risks, benefits and other options for treatment, the patient has consented to  Procedure(s): A/V Fistulagram (N/A) as a surgical intervention.  The patient's history has been reviewed, patient examined, no change in status, stable for surgery.  I have reviewed the patient's chart and labs.  Questions were answered to the patient's satisfaction.     Festus Barren

## 2022-12-03 NOTE — Anesthesia Preprocedure Evaluation (Addendum)
Anesthesia Evaluation  Patient identified by MRN, date of birth, ID band Patient awake    Reviewed: Allergy & Precautions, NPO status , Patient's Chart, lab work & pertinent test results  Airway Mallampati: III  TM Distance: >3 FB Neck ROM: full    Dental  (+) Chipped, Dental Advidsory Given   Pulmonary COPD,  oxygen dependent, Current Smoker and Patient abstained from smoking.   Pulmonary exam normal        Cardiovascular hypertension, + angina  + CAD, + Past MI, + Peripheral Vascular Disease and +CHF  Normal cardiovascular exam     Neuro/Psych  PSYCHIATRIC DISORDERS     Dementia CVA    GI/Hepatic Neg liver ROS,GERD  Controlled,,(+)   ascites    , Hepatitis -S/P multiple paracentesis, 4L removed yesterday   Endo/Other  Hypothyroidism    Renal/GU DialysisRenal diseaseDialysis yesterday  negative genitourinary   Musculoskeletal   Abdominal   Peds  Hematology  (+) Blood dyscrasia, anemia   Anesthesia Other Findings Past Medical History: No date: Adult behavior problems     Comment:  frontal lobe CVA No date: Anemia     Comment:  chronic disease 03/22/2014: Atrial flutter by electrocardiogram (HCC) No date: Cardiomyopathy (HCC) No date: CHF (congestive heart failure) (HCC) No date: Chicken pox No date: Coronary artery disease 04/12/2014: Delayed surgical wound healing     Comment:  Overview:  Left leg No date: GERD (gastroesophageal reflux disease) No date: Heart failure (HCC) No date: Hepatitis     Comment:  history of hep c 02/21/2014: History of CVA (cerebrovascular accident) No date: Hyperlipidemia No date: Hypertension 02/24/2014: Hypotension No date: Myocardial infarction (HCC) No date: Obesity No date: Paroxysmal atrial fibrillation (HCC) No date: Peripheral vascular disease (HCC) No date: Renal failure No date: Renal insufficiency 2011: Stroke (HCC) 03/14/2014: Superficial incisional surgical  site infection  Past Surgical History: 03/30/2019: A/V FISTULAGRAM; Left     Comment:  Procedure: A/V FISTULAGRAM;  Surgeon: Annice Needy, MD;               Location: [facility];  Service: Cardiovascular;              Laterality: Left; 10/06/2019: A/V FISTULAGRAM; Left     Comment:  Procedure: A/V FISTULAGRAM;  Surgeon: Annice Needy, MD;               Location: [facility];  Service: Cardiovascular;              Laterality: Left; 05/19/2021: A/V FISTULAGRAM; Left     Comment:  Procedure: A/V FISTULAGRAM;  Surgeon: Annice Needy, MD;               Location: [facility];  Service: Cardiovascular;              Laterality: Left; 01/29/2022: A/V FISTULAGRAM; Left     Comment:  Procedure: A/V Fistulagram;  Surgeon: Annice Needy, MD;               Location: [facility];  Service: Cardiovascular;              Laterality: Left; 04/30/2022: A/V FISTULAGRAM; Left     Comment:  Procedure: A/V Fistulagram;  Surgeon: Annice Needy, MD;               Location: [facility];  Service: Cardiovascular;  Laterality: Left; No date: COLONOSCOPY 04/22/2021: COLONOSCOPY WITH PROPOFOL; N/A     Comment:  Procedure: COLONOSCOPY WITH PROPOFOL;  Surgeon:               Regis Bill, MD;  Location: [facility] ENDOSCOPY;                Service: Endoscopy;  Laterality: N/A; 2013: CORONARY ANGIOPLASTY WITH STENT PLACEMENT No date: CORONARY ARTERY BYPASS GRAFT No date: DIALYSIS FISTULA CREATION No date: ESOPHAGOGASTRODUODENOSCOPY 04/22/2021: ESOPHAGOGASTRODUODENOSCOPY; N/A     Comment:  Procedure: ESOPHAGOGASTRODUODENOSCOPY (EGD);  Surgeon:               Regis Bill, MD;  Location: [facility] ENDOSCOPY;                Service: Endoscopy;  Laterality: N/A; 02/23/2019: FLEXIBLE SIGMOIDOSCOPY; N/A     Comment:  Procedure: FLEXIBLE SIGMOIDOSCOPY;  Surgeon: Christena Deem, MD;  Location: [facility] ENDOSCOPY;  Service:               Endoscopy;   Laterality: N/A; 08/08/2018: LEFT HEART CATH AND CORS/GRAFTS ANGIOGRAPHY; N/A     Comment:  Procedure: LEFT HEART CATH AND CORS/GRAFTS ANGIOGRAPHY;               Surgeon: Laurier Nancy, MD;  Location: [facility] CV              LAB;  Service: Cardiovascular;  Laterality: N/A; 01/30/2019: LEFT HEART CATH AND CORS/GRAFTS ANGIOGRAPHY; N/A     Comment:  Procedure: LEFT HEART CATH AND CORS/GRAFTS ANGIOGRAPHY;               Surgeon: Laurier Nancy, MD;  Location: [facility] CV              LAB;  Service: Cardiovascular;  Laterality: N/A; No date: percutaneous insertion intra aortic balloon right cath No date: ultrasound guided pericardiocentesis  BMI    Body Mass Index: 25.18 kg/m      Reproductive/Obstetrics negative OB ROS                             Anesthesia Physical Anesthesia Plan  ASA: 3  Anesthesia Plan: General   Post-op Pain Management:    Induction: Intravenous  PONV Risk Score and Plan: Propofol infusion, TIVA and Ondansetron  Airway Management Planned: Natural Airway and Nasal Cannula  Additional Equipment:   Intra-op Plan:   Post-operative Plan:   Informed Consent: I have reviewed the patients History and Physical, chart, labs and discussed the procedure including the risks, benefits and alternatives for the proposed anesthesia with the patient or authorized representative who has indicated his/her understanding and acceptance.     Dental Advisory Given  Plan Discussed with: Anesthesiologist, CRNA and Surgeon  Anesthesia Plan Comments: (Patient consented for risks of anesthesia including but not limited to:  - adverse reactions to medications - damage to eyes, teeth, lips or other oral mucosa - nerve damage due to positioning  - sore throat or hoarseness - Damage to heart, brain, nerves, lungs, other parts of body or loss of life  Patient voiced understanding.)       Anesthesia Quick Evaluation

## 2022-12-03 NOTE — Transfer of Care (Signed)
Immediate Anesthesia Transfer of Care Note  Patient: [name]  Procedure(s) Performed: A/V Fistulagram  Patient Location:  vascular recovery  Anesthesia Type:General  Level of Consciousness: sedated  Airway & Oxygen Therapy: Patient Spontanous Breathing and Patient connected to face mask oxygen  Post-op Assessment: Report given to RN and Post -op Vital signs reviewed and stable  Post vital signs: Reviewed and stable  Last Vitals:  Vitals Value Taken Time  BP 106/70 12/03/22 1448  Temp    Pulse 81 12/03/22 1453  Resp 20 12/03/22 1453  SpO2 100 % 12/03/22 1453  Vitals shown include unvalidated device data.  Last Pain:  Vitals:   12/03/22 1108  TempSrc: Oral  PainSc: 0-No pain         Complications: No notable events documented.

## 2022-12-03 NOTE — Op Note (Signed)
[facility]    OPERATIVE NOTE   PROCEDURE: 1.   Left brachiobasilic arteriovenous fistula cannulation under ultrasound guidance 2.   Left arm fistulagram including central venogram 3.   Percutaneous transluminal angioplasty of the left basilic vein to axillary vein junction stenosis with 8 to 9 mm diameter Lutonix drug-coated angioplasty balloons  PRE-OPERATIVE DIAGNOSIS: 1. ESRD 2. Poorly functional left brachiobasilic AVF  POST-OPERATIVE DIAGNOSIS: same as above   SURGEON: Festus Barren, MD  ANESTHESIA: local with MCS  ESTIMATED BLOOD LOSS: 5 cc  FINDING(S): 65 to 70% stenosis of the basilic vein to axillary vein junction area.  The remainder of the jump graft and the basilic vein were widely patent.  The remainder of the central venous circulation was widely patent.  SPECIMEN(S):  None  CONTRAST: 20 cc  FLUORO TIME: 1.3 minutes  Anesthesia: MAC  INDICATIONS: [name] is a 62 y.o. female who presents with malfunctioning left brachiobasilic arteriovenous fistula with a previous jump graft.  The patient is scheduled for left arm fistulagram.  The patient is aware the risks include but are not limited to: bleeding, infection, thrombosis of the cannulated access, and possible anaphylactic reaction to the contrast.  The patient is aware of the risks of the procedure and elects to proceed forward.  DESCRIPTION: After full informed written consent was obtained, the patient was brought back to the angiography suite and placed supine upon the angiography table.  The patient was connected to monitoring equipment. Moderate conscious sedation was administered with a face to face encounter with the patient throughout the procedure with my supervision of the RN administering medicines and monitoring the patient's vital signs and mental status throughout from the start of the procedure until the patient was taken to the recovery room. The left arm was prepped and  draped in the standard fashion for a percutaneous access intervention.  Under ultrasound guidance, the left brachiobasilic arteriovenous fistula was cannulated with a micropuncture needle under direct ultrasound guidance where it was patent and a permanent image was performed.  The microwire was advanced into the fistula and the needle was exchanged for the a microsheath.  I then upsized to a 6 Fr Sheath and imaging was performed.  Hand injections were completed to image the access including the central venous system. This demonstrated 65 to 70% stenosis of the basilic vein to axillary vein junction area.  The remainder of the jump graft and the basilic vein were widely patent.  The remainder of the central venous circulation was widely patent.  Based on the images, this patient will need intervention to this area of stenosis. I then gave the patient 3000 units of intravenous heparin.  I then crossed the stenosis with a supracore wire.  Based on the imaging, a 8 mm x 6 cm  Lutonix drug coated angioplasty balloon was selected.  The balloon was centered around the basilic vein to the axillary vein junction stenosis and inflated to 10 ATM for 1 minute(s).  This was slightly undersized so I upsized to a 9 mm diameter by 4 cm length Lutonix drug-coated angioplasty balloon and inflated this to 10 atm as well.  On completion imaging, a 20% residual stenosis was present.     Based on the completion imaging, no further intervention is necessary.  The wire and balloon were removed from the sheath.  A 4-0 Monocryl purse-string suture was sewn around the sheath.  The sheath was removed while tying down the suture.  A  sterile bandage was applied to the puncture site.  COMPLICATIONS: None  CONDITION: Stable   Festus Barren  12/03/2022 2:40 PM   This note was created with Dragon Medical transcription system. Any errors in dictation are purely unintentional.

## 2022-12-03 NOTE — Anesthesia Procedure Notes (Signed)
Date/Time: 12/03/2022 2:33 PM  Performed by: Ginger Carne, CRNAPre-anesthesia Checklist: Patient identified, Emergency Drugs available, Suction available, Patient being monitored and Timeout performed Patient Re-evaluated:Patient Re-evaluated prior to induction Oxygen Delivery Method: Simple face mask Preoxygenation: Pre-oxygenation with 100% oxygen Induction Type: IV induction

## 2022-12-04 ENCOUNTER — Encounter: Payer: Self-pay | Admitting: Vascular Surgery

## 2022-12-04 ENCOUNTER — Ambulatory Visit
Admission: RE | Admit: 2022-12-04 | Discharge: 2022-12-04 | Disposition: A | Discharge status: Home / Self Care | Payer: 59 | Source: Ambulatory Visit | Attending: Internal Medicine | Admitting: Internal Medicine

## 2022-12-04 DIAGNOSIS — R188 Other ascites: Secondary | ICD-10-CM | POA: Diagnosis not present

## 2022-12-04 DIAGNOSIS — N186 End stage renal disease: Secondary | ICD-10-CM | POA: Diagnosis not present

## 2022-12-04 DIAGNOSIS — N2581 Secondary hyperparathyroidism of renal origin: Secondary | ICD-10-CM | POA: Diagnosis not present

## 2022-12-04 DIAGNOSIS — Z992 Dependence on renal dialysis: Secondary | ICD-10-CM | POA: Diagnosis not present

## 2022-12-04 MED ORDER — LIDOCAINE HCL (PF) 1 % IJ SOLN
20.0000 mL | Freq: Once | INTRAMUSCULAR | Status: AC
  Administered 2022-12-04: 20 mL via SUBCUTANEOUS
  Filled 2022-12-04: qty 20

## 2022-12-04 NOTE — Procedures (Signed)
PROCEDURE SUMMARY:  Successful US guided paracentesis from LUQ and RLQ.  Yielded 3.2 L of hazy, amber fluid.  No immediate complications.  Pt tolerated well.   Specimen  sent for labs.  EBL < 1 mL  Shon Hough, AGNP 12/04/2022 12:23 PM

## 2022-12-04 NOTE — Anesthesia Postprocedure Evaluation (Signed)
Anesthesia Post Note  Patient: [name]  Procedure(s) Performed: A/V Fistulagram  Patient location during evaluation: Specials Recovery Anesthesia Type: General Level of consciousness: awake and alert Pain management: pain level controlled Vital Signs Assessment: post-procedure vital signs reviewed and stable Respiratory status: spontaneous breathing, nonlabored ventilation, respiratory function stable and patient connected to nasal cannula oxygen Cardiovascular status: blood pressure returned to baseline and stable Postop Assessment: no apparent nausea or vomiting Anesthetic complications: no   No notable events documented.   Last Vitals:  Vitals:   12/03/22 1515 12/03/22 1530  BP: 126/76 131/87  Pulse:  83  Resp: 17 18  Temp:    SpO2:  92%    Last Pain:  Vitals:   12/03/22 1530  TempSrc:   PainSc: 0-No pain                 Stephanie Coup

## 2022-12-07 DIAGNOSIS — Z992 Dependence on renal dialysis: Secondary | ICD-10-CM | POA: Diagnosis not present

## 2022-12-07 DIAGNOSIS — N186 End stage renal disease: Secondary | ICD-10-CM | POA: Diagnosis not present

## 2022-12-07 DIAGNOSIS — N2581 Secondary hyperparathyroidism of renal origin: Secondary | ICD-10-CM | POA: Diagnosis not present

## 2022-12-07 NOTE — Progress Notes (Unsigned)
Acute Office Visit  Subjective:     Patient ID: [name], female    DOB: [birth year], 61 y.o.   MRN: [id number]  Patient is in today for  Chief Complaint  Patient presents with   Foot Injury    Larey Seat and hurt left foot, having pain.    HPI   ROS      Objective:    BP 112/78   Pulse 64   Ht 5\' 6"  (1.676 m)   Wt 153 lb 6.4 oz (69.6 kg)   SpO2 90%   BMI 24.76 kg/m   Physical Exam  No results found for any visits on 11/27/22.  Recent Results (from the past 2160 hour(s))  COVID-19, Flu A+B and RSV     Status: None   Collection Time: 10/21/22  1:30 PM  Result Value Ref Range   SARS-CoV-2, NAA Not Detected Not Detected   Influenza A, NAA Not Detected Not Detected   Influenza B, NAA Not Detected Not Detected   RSV, NAA Not Detected Not Detected   Test Information: Comment     Comment: This nucleic acid amplification test was developed and its performance characteristics determined by [employer]. Nucleic acid amplification tests include RT-PCR and TMA. This test has not been FDA cleared or approved. This test has been authorized by FDA under an Emergency Use Authorization (EUA). This test is only authorized for the duration of time the declaration that circumstances exist justifying the authorization of the emergency use of in vitro diagnostic tests for detection of SARS-CoV-2 virus and/or diagnosis of COVID-19 infection under section 564(b)(1) of the Act, 21 U.S.C. 409WJX-9(J) (1), unless the authorization is terminated or revoked sooner. When diagnostic testing is negative, the possibility of a false negative result should be considered in the context of a patient's recent exposures and the presence of clinical signs and symptoms consistent with COVID-19. An individual without symptoms of COVID-19 and who is not shedding SARS-CoV-2 virus wo uld expect to have a negative (not detected) result in this assay.   POCT Influenza A/B     Status: Normal    Collection Time: 10/22/22  5:37 PM  Result Value Ref Range   Influenza A, POC Negative Negative   Influenza B, POC Negative Negative  VAS [place] ABI WITH/WO TBI     Status: None   Collection Time: 11/12/22  9:40 AM  Result Value Ref Range   Right ABI 1.21    Left ABI 1.23   Potassium [facility] vascular lab only)     Status: None   Collection Time: 12/03/22 11:21 AM  Result Value Ref Range   Potassium [facility] vascular lab) 3.6 3.5 - 5.1 mmol/L    Comment: Performed at [facility], [address] [place], [place] [address]       Assessment & Plan:   Problem List Items Addressed This Visit   None Visit Diagnoses     Pain of left foot    -  Primary   Relevant Orders   DG Foot Complete Left (Completed)   Ambulatory referral to Podiatry   Hallux valgus of right foot       Relevant Orders   Ambulatory referral to Podiatry        No follow-ups on file.  Total time spent: {AMA time spent:29001} minutes  Miki Kins, FNP  11/27/2022   This document may have been prepared by [facility] Voice Recognition software and as such may include unintentional dictation errors.

## 2022-12-08 ENCOUNTER — Telehealth: Payer: Self-pay

## 2022-12-08 ENCOUNTER — Encounter: Payer: Self-pay | Admitting: Vascular Surgery

## 2022-12-08 NOTE — Telephone Encounter (Signed)
Patient called asking for referral for patient to a foot dr at [facility]

## 2022-12-09 DIAGNOSIS — Z992 Dependence on renal dialysis: Secondary | ICD-10-CM | POA: Diagnosis not present

## 2022-12-09 DIAGNOSIS — N2581 Secondary hyperparathyroidism of renal origin: Secondary | ICD-10-CM | POA: Diagnosis not present

## 2022-12-09 DIAGNOSIS — N186 End stage renal disease: Secondary | ICD-10-CM | POA: Diagnosis not present

## 2022-12-10 ENCOUNTER — Encounter: Payer: Self-pay | Admitting: Family

## 2022-12-11 DIAGNOSIS — N2581 Secondary hyperparathyroidism of renal origin: Secondary | ICD-10-CM | POA: Diagnosis not present

## 2022-12-11 DIAGNOSIS — Z992 Dependence on renal dialysis: Secondary | ICD-10-CM | POA: Diagnosis not present

## 2022-12-11 DIAGNOSIS — N186 End stage renal disease: Secondary | ICD-10-CM | POA: Diagnosis not present

## 2022-12-13 DIAGNOSIS — Z992 Dependence on renal dialysis: Secondary | ICD-10-CM | POA: Diagnosis not present

## 2022-12-13 DIAGNOSIS — N186 End stage renal disease: Secondary | ICD-10-CM | POA: Diagnosis not present

## 2022-12-14 ENCOUNTER — Other Ambulatory Visit: Payer: Self-pay | Admitting: Internal Medicine

## 2022-12-14 DIAGNOSIS — N186 End stage renal disease: Secondary | ICD-10-CM | POA: Diagnosis not present

## 2022-12-14 DIAGNOSIS — R188 Other ascites: Secondary | ICD-10-CM

## 2022-12-14 DIAGNOSIS — N2581 Secondary hyperparathyroidism of renal origin: Secondary | ICD-10-CM | POA: Diagnosis not present

## 2022-12-14 DIAGNOSIS — Z992 Dependence on renal dialysis: Secondary | ICD-10-CM | POA: Diagnosis not present

## 2022-12-16 DIAGNOSIS — Z992 Dependence on renal dialysis: Secondary | ICD-10-CM | POA: Diagnosis not present

## 2022-12-16 DIAGNOSIS — N2581 Secondary hyperparathyroidism of renal origin: Secondary | ICD-10-CM | POA: Diagnosis not present

## 2022-12-16 DIAGNOSIS — N186 End stage renal disease: Secondary | ICD-10-CM | POA: Diagnosis not present

## 2022-12-18 ENCOUNTER — Ambulatory Visit
Admission: RE | Admit: 2022-12-18 | Discharge: 2022-12-18 | Disposition: A | Discharge status: Home / Self Care | Payer: 59 | Source: Ambulatory Visit | Attending: Internal Medicine | Admitting: Internal Medicine

## 2022-12-18 DIAGNOSIS — R188 Other ascites: Secondary | ICD-10-CM

## 2022-12-18 DIAGNOSIS — N2581 Secondary hyperparathyroidism of renal origin: Secondary | ICD-10-CM | POA: Diagnosis not present

## 2022-12-18 DIAGNOSIS — N186 End stage renal disease: Secondary | ICD-10-CM | POA: Diagnosis not present

## 2022-12-18 DIAGNOSIS — Z992 Dependence on renal dialysis: Secondary | ICD-10-CM | POA: Diagnosis not present

## 2022-12-18 MED ORDER — LIDOCAINE HCL (PF) 1 % IJ SOLN
10.0000 mL | Freq: Once | INTRAMUSCULAR | Status: AC
  Administered 2022-12-18: 10 mL via INTRADERMAL
  Filled 2022-12-18: qty 10

## 2022-12-21 DIAGNOSIS — N186 End stage renal disease: Secondary | ICD-10-CM | POA: Diagnosis not present

## 2022-12-21 DIAGNOSIS — N2581 Secondary hyperparathyroidism of renal origin: Secondary | ICD-10-CM | POA: Diagnosis not present

## 2022-12-21 DIAGNOSIS — Z992 Dependence on renal dialysis: Secondary | ICD-10-CM | POA: Diagnosis not present

## 2022-12-23 ENCOUNTER — Ambulatory Visit (INDEPENDENT_AMBULATORY_CARE_PROVIDER_SITE_OTHER): Payer: 59 | Admitting: Family

## 2022-12-23 VITALS — BP 100/62 | Ht 66.0 in | Wt 154.4 lb

## 2022-12-23 DIAGNOSIS — N2581 Secondary hyperparathyroidism of renal origin: Secondary | ICD-10-CM | POA: Diagnosis not present

## 2022-12-23 DIAGNOSIS — M7989 Other specified soft tissue disorders: Secondary | ICD-10-CM | POA: Diagnosis not present

## 2022-12-23 DIAGNOSIS — Z992 Dependence on renal dialysis: Secondary | ICD-10-CM | POA: Diagnosis not present

## 2022-12-23 DIAGNOSIS — I739 Peripheral vascular disease, unspecified: Secondary | ICD-10-CM | POA: Diagnosis not present

## 2022-12-23 DIAGNOSIS — N186 End stage renal disease: Secondary | ICD-10-CM | POA: Diagnosis not present

## 2022-12-23 MED ORDER — "XEROFORM PETROLAT PATCH 2""X2"" EX PADS": 1.0000 | MEDICATED_PAD | CUTANEOUS | 0 refills | Status: DC

## 2022-12-23 NOTE — Progress Notes (Signed)
Acute Office Visit  Subjective:     Patient ID: [name], female    DOB: [birth year], 62 y.o.   MRN: [id number]  Patient is in today for  Chief Complaint  Patient presents with   Acute Visit    Blister L leg    Patient here today with a blister on her left leg.  She says that it has been there for around 2 weeks.  It has popped twice, says that the second time it was red fluid.   She is concerned because the wound has not improved at all.  Given her peripheral vascular issues, this caused her concern.   No other concerns today.     Review of Systems  Skin:        Blister on left shin.  All other systems reviewed and are negative.       Objective:    BP 100/62   Ht 5\' 6"  (1.676 m)   Wt 154 lb 6.4 oz (70 kg)   BMI 24.92 kg/m   Physical Exam Vitals and nursing note reviewed.  Constitutional:      Appearance: Normal appearance. She is normal weight.  HENT:     Head: Normocephalic.  Eyes:     Extraocular Movements: Extraocular movements intact.     Conjunctiva/sclera: Conjunctivae normal.     Pupils: Pupils are equal, round, and reactive to light.  Cardiovascular:     Rate and Rhythm: Normal rate.  Pulmonary:     Effort: Pulmonary effort is normal.  Musculoskeletal:     Right lower leg: Edema present.     Left lower leg: Edema present.  Skin:    Findings: Lesion (blister on left shin.) present.       Neurological:     General: No focal deficit present.     Mental Status: She is alert and oriented to person, place, and time. Mental status is at baseline.  Psychiatric:        Mood and Affect: Mood normal.        Behavior: Behavior normal.        Thought Content: Thought content normal.        Judgment: Judgment normal.     No results found for any visits on 12/23/22.  Recent Results (from the past 2160 hour(s))  COVID-19, Flu A+B and RSV     Status: None   Collection Time: 10/21/22  1:30 PM  Result Value Ref Range   SARS-CoV-2, NAA Not  Detected Not Detected   Influenza A, NAA Not Detected Not Detected   Influenza B, NAA Not Detected Not Detected   RSV, NAA Not Detected Not Detected   Test Information: Comment     Comment: This nucleic acid amplification test was developed and its performance characteristics determined by [employer]. Nucleic acid amplification tests include RT-PCR and TMA. This test has not been FDA cleared or approved. This test has been authorized by FDA under an Emergency Use Authorization (EUA). This test is only authorized for the duration of time the declaration that circumstances exist justifying the authorization of the emergency use of in vitro diagnostic tests for detection of SARS-CoV-2 virus and/or diagnosis of COVID-19 infection under section 564(b)(1) of the Act, 21 U.S.C. 295MWU-1(L) (1), unless the authorization is terminated or revoked sooner. When diagnostic testing is negative, the possibility of a false negative result should be considered in the context of a patient's recent exposures and the presence of clinical signs and symptoms consistent  with COVID-19. An individual without symptoms of COVID-19 and who is not shedding SARS-CoV-2 virus wo uld expect to have a negative (not detected) result in this assay.   POCT Influenza A/B     Status: Normal   Collection Time: 10/22/22  5:37 PM  Result Value Ref Range   Influenza A, POC Negative Negative   Influenza B, POC Negative Negative  VAS [place] ABI WITH/WO TBI     Status: None   Collection Time: 11/12/22  9:40 AM  Result Value Ref Range   Right ABI 1.21    Left ABI 1.23   Potassium [facility] vascular lab only)     Status: None   Collection Time: 12/03/22 11:21 AM  Result Value Ref Range   Potassium [facility] vascular lab) 3.6 3.5 - 5.1 mmol/L    Comment: Performed at [facility], [address], [place], [place] [address]       Assessment & Plan:   Problem List Items Addressed This Visit       Active Problems    Swelling of limb - Primary    Sending to the wound clinic to get her leg taken care of.       Peripheral vascular disease of lower extremity (HCC)    Sending patient to wound clinic.  I have also asked that she get some vaseline gauze in the meantime to see if this helps keep the spot from hurting.          Return as previously scheduled.  Total time spent: 20 minutes  Miki Kins, FNP  12/23/2022   This document may have been prepared by [facility] Voice Recognition software and as such may include unintentional dictation errors.

## 2022-12-24 DIAGNOSIS — I509 Heart failure, unspecified: Secondary | ICD-10-CM | POA: Diagnosis not present

## 2022-12-25 DIAGNOSIS — N186 End stage renal disease: Secondary | ICD-10-CM | POA: Diagnosis not present

## 2022-12-25 DIAGNOSIS — N2581 Secondary hyperparathyroidism of renal origin: Secondary | ICD-10-CM | POA: Diagnosis not present

## 2022-12-25 DIAGNOSIS — Z992 Dependence on renal dialysis: Secondary | ICD-10-CM | POA: Diagnosis not present

## 2022-12-28 DIAGNOSIS — N186 End stage renal disease: Secondary | ICD-10-CM | POA: Diagnosis not present

## 2022-12-28 DIAGNOSIS — Z992 Dependence on renal dialysis: Secondary | ICD-10-CM | POA: Diagnosis not present

## 2022-12-28 DIAGNOSIS — N2581 Secondary hyperparathyroidism of renal origin: Secondary | ICD-10-CM | POA: Diagnosis not present

## 2022-12-29 ENCOUNTER — Ambulatory Visit (INDEPENDENT_AMBULATORY_CARE_PROVIDER_SITE_OTHER): Payer: Medicare Other | Admitting: Vascular Surgery

## 2022-12-29 ENCOUNTER — Other Ambulatory Visit: Payer: Self-pay | Admitting: Internal Medicine

## 2022-12-29 ENCOUNTER — Telehealth: Payer: Self-pay

## 2022-12-29 ENCOUNTER — Ambulatory Visit (INDEPENDENT_AMBULATORY_CARE_PROVIDER_SITE_OTHER): Payer: 59 | Admitting: Podiatry

## 2022-12-29 ENCOUNTER — Encounter (INDEPENDENT_AMBULATORY_CARE_PROVIDER_SITE_OTHER): Payer: Medicare Other

## 2022-12-29 DIAGNOSIS — M659 Synovitis and tenosynovitis, unspecified: Secondary | ICD-10-CM | POA: Diagnosis not present

## 2022-12-29 DIAGNOSIS — R188 Other ascites: Secondary | ICD-10-CM

## 2022-12-29 NOTE — Progress Notes (Signed)
Subjective:  Patient ID: [name], female    DOB: 14-Feb-1961,  MRN: [id number]  Chief Complaint  Patient presents with   Toe Pain    Pt stated that she was told by another dr that she had gout but she does not think that.     62 y.o. female presents with the above complaint.  Patient presents with left second metatarsophalangeal joint pain.  Patient states pain for touch is progressive gotten worse worse with ambulation worse with pressure she would like for me to discuss treatment options for her.  She has not seen MRIs prior to seeing me.  She was told that she may have gout but she does not think that her diet has been good.  She denies any other acute issues   Review of Systems: Negative except as noted in the HPI. Denies N/V/F/Ch.  Past Medical History:  Diagnosis Date   Adult behavior problems    frontal lobe CVA   Anemia    chronic disease   Atrial flutter by electrocardiogram (HCC) 03/22/2014   Cardiomyopathy (HCC)    CHF (congestive heart failure) (HCC)    Chicken pox    Chronic respiratory failure with hypoxia (HCC) 12/19/2021   Coronary artery disease    Delayed surgical wound healing 04/12/2014   Overview:  Left leg   GERD (gastroesophageal reflux disease)    H/O atrial flutter 04/12/2014   H/O stroke without residual deficits 02/13/2014   H/O: substance abuse (HCC) 02/27/2014   Heart failure (HCC)    Hepatitis    history of hep c   History of CVA (cerebrovascular accident) 02/21/2014   History of delirium 04/12/2014   Hyperkalemia 11/13/2014   Hyperlipidemia    Hypertension    Hypotension 02/24/2014   Leukocytosis 02/26/2014   Myocardial infarction [facility])    Obesity    Paroxysmal atrial fibrillation (HCC)    Peripheral vascular disease (HCC)    Postoperative anemia due to acute blood loss 02/27/2014   Renal failure    Renal insufficiency    Stroke [facility]) 2011   Superficial incisional surgical site infection 03/14/2014   Syncope and collapse  07/25/2020    Current Outpatient Medications:    albuterol (VENTOLIN HFA) 108 (90 Base) MCG/ACT inhaler, Inhale into the lungs., Disp: , Rfl:    amiodarone (PACERONE) 200 MG tablet, Take 200 mg by mouth daily., Disp: , Rfl:    amoxicillin-clavulanate (AUGMENTIN) 500-125 MG tablet, Take 1 tablet by mouth daily. Take an additional dose after dialysis. (Patient not taking: Reported on 12/03/2022), Disp: 10 tablet, Rfl: 0   apixaban (ELIQUIS) 5 MG TABS tablet, Take 5 mg by mouth 2 (two) times daily., Disp: , Rfl:    Bismuth Tribromoph-Petrolatum (XEROFORM PETROLAT PATCH 2"X2") PADS, Apply 1 each topically every 3 (three) days., Disp: 25 each, Rfl: 0   hydrOXYzine (ATARAX/VISTARIL) 50 MG tablet, Take 50 mg by mouth at bedtime., Disp: , Rfl:    lactulose (CHRONULAC) 10 GM/15ML solution, , Disp: , Rfl:    levothyroxine (SYNTHROID) 75 MCG tablet, Take 75 mcg by mouth daily before breakfast. , Disp: , Rfl:    lidocaine-prilocaine (EMLA) cream, Apply 1 application topically daily as needed (port access)., Disp: , Rfl:    midodrine (PROAMATINE) 10 MG tablet, Take 1 tablet (10 mg total) by mouth 3 (three) times a week. Monday, Wednesday, Friday with hemodialysis, Disp: , Rfl:    pantoprazole (PROTONIX) 40 MG tablet, Take 40 mg by mouth daily., Disp: , Rfl:  rosuvastatin (CRESTOR) 20 MG tablet, Take 20 mg by mouth at bedtime., Disp: , Rfl:    sevelamer carbonate (RENVELA) 800 MG tablet, Take 3,200 mg by mouth 3 (three) times daily with meals., Disp: , Rfl:   Social History   Tobacco Use  Smoking Status Some Days   Current packs/day: 0.25   Average packs/day: 0.3 packs/day for 20.0 years (5.0 ttl pk-yrs)   Types: Cigarettes  Smokeless Tobacco Never  Tobacco Comments   smokes two cigarettes a day    Allergies  Allergen Reactions   Morphine And Codeine Hives   Levaquin [Levofloxacin] Itching    Severe itching; prickly sensation   Enalapril Other (See Comments)    hallucinations   Other     Latex Rash   Tape Rash   Objective:  There were no vitals filed for this visit. There is no height or weight on file to calculate BMI. Constitutional Well developed. Well nourished.  Vascular Dorsalis pedis pulses palpable bilaterally. Posterior tibial pulses palpable bilaterally. Capillary refill normal to all digits.  No cyanosis or clubbing noted. Pedal hair growth normal.  Neurologic Normal speech. Oriented to person, place, and time. Epicritic sensation to light touch grossly present bilaterally.  Dermatologic Nails well groomed and normal in appearance. No open wounds. No skin lesions.  Orthopedic: Left severe bunion deformity noted with excessive pressure to the second metatarsophalangeal joint.  Pain with range of motion of the joint pain on palpation to the second metatarsophalangeal joint.  Patient is experiencing metatarsalgia pain due to abnormality in the metatarsal pattern.   Radiographs: None Assessment:   1. Synovitis of left foot    Plan:  Patient was evaluated and treated and all questions answered.  Left second metatarsophalangeal joint synovitis -Explained to patient the etiology of synovitis and various treatment options were discussed -Given the amount of pain that she is having she will benefit from steroid injection of decrease confirmed recurrent [place] surgical pain.  Patient agrees with plan like to proceed with steroid injection. -A steroid injection was performed at left second MTP using 1% plain Lidocaine and 10 mg of Kenalog. This was well tolerated.   No follow-ups on file.

## 2022-12-30 DIAGNOSIS — N2581 Secondary hyperparathyroidism of renal origin: Secondary | ICD-10-CM | POA: Diagnosis not present

## 2022-12-30 DIAGNOSIS — Z992 Dependence on renal dialysis: Secondary | ICD-10-CM | POA: Diagnosis not present

## 2022-12-30 DIAGNOSIS — N186 End stage renal disease: Secondary | ICD-10-CM | POA: Diagnosis not present

## 2023-01-01 ENCOUNTER — Ambulatory Visit
Admission: RE | Admit: 2023-01-01 | Discharge: 2023-01-01 | Disposition: A | Discharge status: Home / Self Care | Payer: 59 | Source: Ambulatory Visit | Attending: Internal Medicine | Admitting: Internal Medicine

## 2023-01-01 DIAGNOSIS — R188 Other ascites: Secondary | ICD-10-CM | POA: Insufficient documentation

## 2023-01-01 DIAGNOSIS — N2581 Secondary hyperparathyroidism of renal origin: Secondary | ICD-10-CM | POA: Diagnosis not present

## 2023-01-01 DIAGNOSIS — N186 End stage renal disease: Secondary | ICD-10-CM | POA: Diagnosis not present

## 2023-01-01 DIAGNOSIS — Z992 Dependence on renal dialysis: Secondary | ICD-10-CM | POA: Diagnosis not present

## 2023-01-01 MED ORDER — LIDOCAINE HCL (PF) 1 % IJ SOLN
10.0000 mL | Freq: Once | INTRAMUSCULAR | Status: AC
  Administered 2023-01-01: 10 mL via INTRADERMAL
  Filled 2023-01-01: qty 10

## 2023-01-02 ENCOUNTER — Encounter: Payer: Self-pay | Admitting: Family

## 2023-01-02 DIAGNOSIS — I739 Peripheral vascular disease, unspecified: Secondary | ICD-10-CM | POA: Insufficient documentation

## 2023-01-02 NOTE — Assessment & Plan Note (Signed)
Sending patient to wound clinic.  I have also asked that she get some vaseline gauze in the meantime to see if this helps keep the spot from hurting.

## 2023-01-02 NOTE — Assessment & Plan Note (Signed)
Sending to the wound clinic to get her leg taken care of.

## 2023-01-05 DIAGNOSIS — Z992 Dependence on renal dialysis: Secondary | ICD-10-CM | POA: Diagnosis not present

## 2023-01-05 DIAGNOSIS — N2581 Secondary hyperparathyroidism of renal origin: Secondary | ICD-10-CM | POA: Diagnosis not present

## 2023-01-05 DIAGNOSIS — N186 End stage renal disease: Secondary | ICD-10-CM | POA: Diagnosis not present

## 2023-01-06 DIAGNOSIS — Z992 Dependence on renal dialysis: Secondary | ICD-10-CM | POA: Diagnosis not present

## 2023-01-06 DIAGNOSIS — N2581 Secondary hyperparathyroidism of renal origin: Secondary | ICD-10-CM | POA: Diagnosis not present

## 2023-01-06 DIAGNOSIS — N186 End stage renal disease: Secondary | ICD-10-CM | POA: Diagnosis not present

## 2023-01-07 ENCOUNTER — Ambulatory Visit (INDEPENDENT_AMBULATORY_CARE_PROVIDER_SITE_OTHER): Payer: 59 | Admitting: Family

## 2023-01-07 ENCOUNTER — Encounter: Payer: Self-pay | Admitting: Family

## 2023-01-07 VITALS — BP 100/60 | Ht 66.0 in | Wt 156.6 lb

## 2023-01-07 DIAGNOSIS — N186 End stage renal disease: Secondary | ICD-10-CM | POA: Diagnosis not present

## 2023-01-07 DIAGNOSIS — E785 Hyperlipidemia, unspecified: Secondary | ICD-10-CM

## 2023-01-07 DIAGNOSIS — M79672 Pain in left foot: Secondary | ICD-10-CM

## 2023-01-07 DIAGNOSIS — I739 Peripheral vascular disease, unspecified: Secondary | ICD-10-CM

## 2023-01-07 DIAGNOSIS — K7469 Other cirrhosis of liver: Secondary | ICD-10-CM

## 2023-01-07 DIAGNOSIS — M79605 Pain in left leg: Secondary | ICD-10-CM | POA: Insufficient documentation

## 2023-01-07 DIAGNOSIS — M2011 Hallux valgus (acquired), right foot: Secondary | ICD-10-CM | POA: Diagnosis not present

## 2023-01-07 DIAGNOSIS — D696 Thrombocytopenia, unspecified: Secondary | ICD-10-CM

## 2023-01-07 DIAGNOSIS — Z992 Dependence on renal dialysis: Secondary | ICD-10-CM | POA: Diagnosis not present

## 2023-01-07 MED ORDER — ALBUTEROL SULFATE HFA 108 (90 BASE) MCG/ACT IN AERS: 1.0000 | INHALATION_SPRAY | Freq: Four times a day (QID) | RESPIRATORY_TRACT | 3 refills | Status: DC | PRN

## 2023-01-07 MED ORDER — OXYCODONE-ACETAMINOPHEN 5-325 MG PO TABS: 1.0000 | ORAL_TABLET | Freq: Four times a day (QID) | ORAL | 0 refills | Status: DC | PRN

## 2023-01-07 NOTE — Progress Notes (Signed)
Established Patient Office Visit  Subjective:  Patient ID: [name], female    DOB: [birth year]  Age: 62 y.o. MRN: [id number]  Chief Complaint  Patient presents with   Follow-up    3 mo F/U    Patient is here today for her 3 months follow up.  She has been feeling poorly since last appointment.   She does have additional concerns to discuss today.  She is still having significant pain from her left leg wound, but she did get a call from the wound clinic and has an appointment set up.   Labs are done at dialysis, so we will pull results from there.  She needs refills.   I have reviewed her active problem list, medication list, allergies, notes from last encounter, lab results for her appointment today.                                                                                                                                                                                                                                                                                                                      No other concerns at this time.   Past Medical History:  Diagnosis Date   Adult behavior problems    frontal lobe CVA   Anemia    chronic disease   Atrial flutter by electrocardiogram (HCC) 03/22/2014   Cardiomyopathy (HCC)    CHF (congestive heart failure) (HCC)    Chicken pox    Chronic respiratory failure with hypoxia (HCC) 12/19/2021   Coronary artery disease    Delayed surgical wound healing 04/12/2014   Overview:  Left leg   GERD (gastroesophageal reflux disease)    H/O atrial flutter 04/12/2014   H/O stroke without residual deficits 02/13/2014   H/O: substance abuse (HCC) 02/27/2014   Heart failure (HCC)    Hepatitis    history of hep c   History of CVA (cerebrovascular accident) 02/21/2014   History of delirium 04/12/2014   Hyperkalemia 11/13/2014  Hyperlipidemia    Hypertension    Hypotension 02/24/2014   Leukocytosis 02/26/2014   Myocardial  infarction [facility])    Obesity    Paroxysmal atrial fibrillation (HCC)    Peripheral vascular disease (HCC)    Postoperative anemia due to acute blood loss 02/27/2014   Renal failure    Renal insufficiency    Stroke [facility]) 2011   Superficial incisional surgical site infection 03/14/2014   Syncope and collapse 07/25/2020    Past Surgical History:  Procedure Laterality Date   A/V FISTULAGRAM Left 03/30/2019   Procedure: A/V FISTULAGRAM;  Surgeon: Annice Needy, MD;  Location: [facility] INVASIVE CV LAB;  Service: Cardiovascular;  Laterality: Left;   A/V FISTULAGRAM Left 10/06/2019   Procedure: A/V FISTULAGRAM;  Surgeon: Annice Needy, MD;  Location: [facility] INVASIVE CV LAB;  Service: Cardiovascular;  Laterality: Left;   A/V FISTULAGRAM Left 05/19/2021   Procedure: A/V FISTULAGRAM;  Surgeon: Annice Needy, MD;  Location: [facility] INVASIVE CV LAB;  Service: Cardiovascular;  Laterality: Left;   A/V FISTULAGRAM Left 01/29/2022   Procedure: A/V Fistulagram;  Surgeon: Annice Needy, MD;  Location: [facility] INVASIVE CV LAB;  Service: Cardiovascular;  Laterality: Left;   A/V FISTULAGRAM Left 04/30/2022   Procedure: A/V Fistulagram;  Surgeon: Annice Needy, MD;  Location: [facility] INVASIVE CV LAB;  Service: Cardiovascular;  Laterality: Left;   A/V FISTULAGRAM Left 08/20/2022   Procedure: A/V Fistulagram;  Surgeon: Annice Needy, MD;  Location: [facility] INVASIVE CV LAB;  Service: Cardiovascular;  Laterality: Left;   A/V FISTULAGRAM N/A 12/03/2022   Procedure: A/V Fistulagram;  Surgeon: Annice Needy, MD;  Location: [facility] INVASIVE CV LAB;  Service: Cardiovascular;  Laterality: N/A;   COLONOSCOPY     COLONOSCOPY WITH PROPOFOL N/A 04/22/2021   Procedure: COLONOSCOPY WITH PROPOFOL;  Surgeon: Regis Bill, MD;  Location: [facility] ENDOSCOPY;  Service: Endoscopy;  Laterality: N/A;   CORONARY ANGIOPLASTY WITH STENT PLACEMENT  2013   CORONARY ARTERY BYPASS GRAFT     DIALYSIS FISTULA CREATION     ESOPHAGOGASTRODUODENOSCOPY      ESOPHAGOGASTRODUODENOSCOPY N/A 04/22/2021   Procedure: ESOPHAGOGASTRODUODENOSCOPY (EGD);  Surgeon: Regis Bill, MD;  Location: [facility] ENDOSCOPY;  Service: Endoscopy;  Laterality: N/A;   FLEXIBLE SIGMOIDOSCOPY N/A 02/23/2019   Procedure: FLEXIBLE SIGMOIDOSCOPY;  Surgeon: Christena Deem, MD;  Location: [facility] ENDOSCOPY;  Service: Endoscopy;  Laterality: N/A;   LEFT HEART CATH AND CORS/GRAFTS ANGIOGRAPHY N/A 08/08/2018   Procedure: LEFT HEART CATH AND CORS/GRAFTS ANGIOGRAPHY;  Surgeon: Laurier Nancy, MD;  Location: [facility] INVASIVE CV LAB;  Service: Cardiovascular;  Laterality: N/A;   LEFT HEART CATH AND CORS/GRAFTS ANGIOGRAPHY N/A 01/30/2019   Procedure: LEFT HEART CATH AND CORS/GRAFTS ANGIOGRAPHY;  Surgeon: Laurier Nancy, MD;  Location: [facility] INVASIVE CV LAB;  Service: Cardiovascular;  Laterality: N/A;   percutaneous insertion intra aortic balloon right cath     ultrasound guided pericardiocentesis      Social History   Socioeconomic History   Marital status: Single    Spouse name: Not on file   Number of children: Not on file   Years of education: Not on file   Highest education level: Not on file  Occupational History   Not on file  Tobacco Use   Smoking status: Some Days    Current packs/day: 0.25    Average packs/day: 0.3 packs/day for 20.0 years (5.0 ttl pk-yrs)    Types: Cigarettes   Smokeless tobacco: Never   Tobacco comments:    smokes  two cigarettes a day  Vaping Use   Vaping status: Never Used  Substance and Sexual Activity   Alcohol use: No   Drug use: No   Sexual activity: Not Currently    Birth control/protection: Post-menopausal  Other Topics Concern   Not on file  Social History Narrative   Not on file   Social Determinants of Health   Financial Resource Strain: Not on file  Food Insecurity: Not on file  Transportation Needs: Not on file  Physical Activity: Not on file  Stress: Not on file  Social Connections: Not on file  Intimate Partner  Violence: Not on file    Family History  Problem Relation Age of Onset   Hypertension Mother    Ovarian cancer Mother    Diabetes type II Mother    Hypertension Father    Kidney disease Father    Hypertension Sister    Diabetes type II Maternal Grandmother    Breast cancer Maternal Grandmother    Breast cancer Maternal Aunt    Hypertension Other    Cancer Other    Renal Disease Other     Allergies  Allergen Reactions   Morphine And Codeine Hives   Levaquin [Levofloxacin] Itching    Severe itching; prickly sensation   Enalapril Other (See Comments)    hallucinations   Other    Latex Rash   Tape Rash    Review of Systems  Skin:        Wound on left shin, started as blister   All other systems reviewed and are negative.      Objective:   BP 100/60   Ht 5\' 6"  (1.676 m)   Wt 156 lb 9.6 oz (71 kg)   BMI 25.28 kg/m   Vitals:   01/07/23 0854  BP: 100/60  Height: 5\' 6"  (1.676 m)  Weight: 156 lb 9.6 oz (71 kg)  BMI (Calculated): 25.29    Physical Exam Vitals and nursing note reviewed.  Constitutional:      Appearance: Normal appearance. She is normal weight.  HENT:     Head: Normocephalic.  Eyes:     Pupils: Pupils are equal, round, and reactive to light.  Cardiovascular:     Rate and Rhythm: Normal rate.  Pulmonary:     Effort: Pulmonary effort is normal.  Neurological:     General: No focal deficit present.     Mental Status: She is alert and oriented to person, place, and time. Mental status is at baseline.  Psychiatric:        Mood and Affect: Mood normal.        Behavior: Behavior normal.        Thought Content: Thought content normal.        Judgment: Judgment normal.      No results found for any visits on 01/07/23.  Recent Results (from the past 2160 hour(s))  COVID-19, Flu A+B and RSV     Status: None   Collection Time: 10/21/22  1:30 PM  Result Value Ref Range   SARS-CoV-2, NAA Not Detected Not Detected   Influenza A, NAA Not  Detected Not Detected   Influenza B, NAA Not Detected Not Detected   RSV, NAA Not Detected Not Detected   Test Information: Comment     Comment: This nucleic acid amplification test was developed and its performance characteristics determined by [employer]. Nucleic acid amplification tests include RT-PCR and TMA. This test has not been FDA cleared or approved. This  test has been authorized by FDA under an Emergency Use Authorization (EUA). This test is only authorized for the duration of time the declaration that circumstances exist justifying the authorization of the emergency use of in vitro diagnostic tests for detection of SARS-CoV-2 virus and/or diagnosis of COVID-19 infection under section 564(b)(1) of the Act, 21 U.S.C. 782NFA-2(Z) (1), unless the authorization is terminated or revoked sooner. When diagnostic testing is negative, the possibility of a false negative result should be considered in the context of a patient's recent exposures and the presence of clinical signs and symptoms consistent with COVID-19. An individual without symptoms of COVID-19 and who is not shedding SARS-CoV-2 virus wo uld expect to have a negative (not detected) result in this assay.   POCT Influenza A/B     Status: Normal   Collection Time: 10/22/22  5:37 PM  Result Value Ref Range   Influenza A, POC Negative Negative   Influenza B, POC Negative Negative  VAS [place] ABI WITH/WO TBI     Status: None   Collection Time: 11/12/22  9:40 AM  Result Value Ref Range   Right ABI 1.21    Left ABI 1.23   Potassium [facility] vascular lab only)     Status: None   Collection Time: 12/03/22 11:21 AM  Result Value Ref Range   Potassium [facility] vascular lab) 3.6 3.5 - 5.1 mmol/L    Comment: Performed at [facility], [address] [place], [place] [address]       Assessment & Plan:   Problem List Items Addressed This Visit       Active Problems   ESRD on hemodialysis (HCC) - Primary  (Chronic)    Patient is seen by Nephrology, who manage this condition.  She is well controlled with current therapy.   Will defer to them for further changes to plan of care.       Hyperlipidemia    Checking labs today.  Continue current therapy for lipid control. Will modify as needed based on labwork results.        Thrombocytopenia (HCC)    Patient stable.  Well controlled with current therapy.   Continue current meds.       Other cirrhosis of liver (HCC)    Patient stable.  Well controlled with current therapy.   Continue current meds.        Peripheral vascular disease of lower extremity (HCC)    Wound still present, patient is scheduled for an appointment with wound clinic. Continue current dressing changes for now.   Will reassess at follow up.       Hallux valgus of right foot    Patient is seen by Podiatry., who manage this condition.  She is well controlled with current therapy.   Will defer to them for further changes to plan of care.       Pain of left foot    Patient is seen by Podiatry, who manage this condition.  She is well controlled with current therapy.   Will defer to them for further changes to plan of care.        Return in about 3 months (around 04/09/2023).   Total time spent: 30 minutes  Miki Kins, FNP  01/07/2023   This document may have been prepared by [facility] Voice Recognition software and as such may include unintentional dictation errors.

## 2023-01-07 NOTE — Assessment & Plan Note (Signed)
Patient stable.  Well controlled with current therapy.   Continue current meds.  

## 2023-01-07 NOTE — Assessment & Plan Note (Signed)
Checking labs today.  Continue current therapy for lipid control. Will modify as needed based on labwork results.  

## 2023-01-07 NOTE — Assessment & Plan Note (Signed)
Wound still present, patient is scheduled for an appointment with wound clinic. Continue current dressing changes for now.   Will reassess at follow up.

## 2023-01-07 NOTE — Assessment & Plan Note (Signed)
Patient is seen by Podiatry., who manage this condition.  She is well controlled with current therapy.   Will defer to them for further changes to plan of care.

## 2023-01-07 NOTE — Assessment & Plan Note (Signed)
Patient is seen by Nephrology, who manage this condition.  She is well controlled with current therapy.   Will defer to them for further changes to plan of care.

## 2023-01-08 DIAGNOSIS — Z992 Dependence on renal dialysis: Secondary | ICD-10-CM | POA: Diagnosis not present

## 2023-01-08 DIAGNOSIS — N186 End stage renal disease: Secondary | ICD-10-CM | POA: Diagnosis not present

## 2023-01-08 DIAGNOSIS — N2581 Secondary hyperparathyroidism of renal origin: Secondary | ICD-10-CM | POA: Diagnosis not present

## 2023-01-11 ENCOUNTER — Other Ambulatory Visit: Payer: Self-pay | Admitting: Internal Medicine

## 2023-01-11 DIAGNOSIS — N186 End stage renal disease: Secondary | ICD-10-CM | POA: Diagnosis not present

## 2023-01-11 DIAGNOSIS — Z992 Dependence on renal dialysis: Secondary | ICD-10-CM | POA: Diagnosis not present

## 2023-01-11 DIAGNOSIS — R188 Other ascites: Secondary | ICD-10-CM

## 2023-01-11 DIAGNOSIS — N2581 Secondary hyperparathyroidism of renal origin: Secondary | ICD-10-CM | POA: Diagnosis not present

## 2023-01-11 NOTE — Telephone Encounter (Signed)
Already taken care of at visit an they have called patient already

## 2023-01-13 ENCOUNTER — Other Ambulatory Visit (INDEPENDENT_AMBULATORY_CARE_PROVIDER_SITE_OTHER): Payer: Self-pay | Admitting: Vascular Surgery

## 2023-01-13 DIAGNOSIS — Z992 Dependence on renal dialysis: Secondary | ICD-10-CM | POA: Diagnosis not present

## 2023-01-13 DIAGNOSIS — N186 End stage renal disease: Secondary | ICD-10-CM

## 2023-01-13 DIAGNOSIS — N2581 Secondary hyperparathyroidism of renal origin: Secondary | ICD-10-CM | POA: Diagnosis not present

## 2023-01-14 ENCOUNTER — Ambulatory Visit (INDEPENDENT_AMBULATORY_CARE_PROVIDER_SITE_OTHER): Payer: 59

## 2023-01-14 ENCOUNTER — Encounter (INDEPENDENT_AMBULATORY_CARE_PROVIDER_SITE_OTHER): Payer: Self-pay | Admitting: Nurse Practitioner

## 2023-01-14 ENCOUNTER — Ambulatory Visit (INDEPENDENT_AMBULATORY_CARE_PROVIDER_SITE_OTHER): Payer: 59 | Admitting: Nurse Practitioner

## 2023-01-14 ENCOUNTER — Encounter: Payer: 59 | Attending: Physician Assistant | Admitting: Physician Assistant

## 2023-01-14 VITALS — BP 104/68 | HR 76 | Resp 18 | Ht 66.0 in | Wt 156.0 lb

## 2023-01-14 DIAGNOSIS — I10 Essential (primary) hypertension: Secondary | ICD-10-CM

## 2023-01-14 DIAGNOSIS — N186 End stage renal disease: Secondary | ICD-10-CM | POA: Insufficient documentation

## 2023-01-14 DIAGNOSIS — I87313 Chronic venous hypertension (idiopathic) with ulcer of bilateral lower extremity: Secondary | ICD-10-CM | POA: Diagnosis not present

## 2023-01-14 DIAGNOSIS — Z8673 Personal history of transient ischemic attack (TIA), and cerebral infarction without residual deficits: Secondary | ICD-10-CM | POA: Insufficient documentation

## 2023-01-14 DIAGNOSIS — E785 Hyperlipidemia, unspecified: Secondary | ICD-10-CM

## 2023-01-14 DIAGNOSIS — L97818 Non-pressure chronic ulcer of other part of right lower leg with other specified severity: Secondary | ICD-10-CM | POA: Diagnosis not present

## 2023-01-14 DIAGNOSIS — Z992 Dependence on renal dialysis: Secondary | ICD-10-CM | POA: Diagnosis not present

## 2023-01-14 DIAGNOSIS — I87323 Chronic venous hypertension (idiopathic) with inflammation of bilateral lower extremity: Secondary | ICD-10-CM | POA: Diagnosis not present

## 2023-01-14 DIAGNOSIS — I2510 Atherosclerotic heart disease of native coronary artery without angina pectoris: Secondary | ICD-10-CM | POA: Diagnosis not present

## 2023-01-14 DIAGNOSIS — F17200 Nicotine dependence, unspecified, uncomplicated: Secondary | ICD-10-CM | POA: Insufficient documentation

## 2023-01-14 DIAGNOSIS — L97828 Non-pressure chronic ulcer of other part of left lower leg with other specified severity: Secondary | ICD-10-CM | POA: Diagnosis not present

## 2023-01-14 DIAGNOSIS — I120 Hypertensive chronic kidney disease with stage 5 chronic kidney disease or end stage renal disease: Secondary | ICD-10-CM | POA: Insufficient documentation

## 2023-01-15 ENCOUNTER — Ambulatory Visit
Admission: RE | Admit: 2023-01-15 | Discharge: 2023-01-15 | Disposition: A | Discharge status: Home / Self Care | Payer: 59 | Source: Ambulatory Visit | Attending: Internal Medicine | Admitting: Internal Medicine

## 2023-01-15 DIAGNOSIS — N186 End stage renal disease: Secondary | ICD-10-CM | POA: Diagnosis not present

## 2023-01-15 DIAGNOSIS — Z992 Dependence on renal dialysis: Secondary | ICD-10-CM | POA: Diagnosis not present

## 2023-01-15 DIAGNOSIS — R188 Other ascites: Secondary | ICD-10-CM

## 2023-01-15 DIAGNOSIS — N2581 Secondary hyperparathyroidism of renal origin: Secondary | ICD-10-CM | POA: Diagnosis not present

## 2023-01-15 MED ORDER — LIDOCAINE HCL (PF) 1 % IJ SOLN
20.0000 mL | Freq: Once | INTRAMUSCULAR | Status: AC
  Administered 2023-01-15: 20 mL via SUBCUTANEOUS
  Filled 2023-01-15: qty 20

## 2023-01-15 NOTE — Progress Notes (Signed)
SHAVONE, [name] ([id number]) [id number] Page 1 of 6 Visit Report for 01/14/2023 Allergy List Details Patient Name: Date of Service: [place], [place]. 01/14/2023 2:00 PM Medical Record Number: [id number] Patient Account Number: [id number] Date of Birth/Sex: Treating RN: Jul 12, 1960 (62 y.o. Ginette Pitman Primary Care : Grayling Congress Other Clinician: Referring : Treating /Extender: Erasmo Leventhal Weeks in Treatment: 0 Allergies Active Allergies morphine latex adhesive Type: Allergen Levaquin Reaction: itching Type: Medication Allergy Notes Electronic Signature(s) Signed: 01/15/2023 1:16:50 PM By: Midge Aver MSN RN CNS WTA Entered By: Midge Aver on 01/14/2023 14:01:27 -------------------------------------------------------------------------------- Arrival Information Details Patient Name: Date of Service: Clent Ridges F. 01/14/2023 2:00 PM Medical Record Number: [id number] Patient Account Number: [id number] Date of Birth/Sex: Treating RN: 1961-05-20 (62 y.o. Ginette Pitman Primary Care : Grayling Congress Other Clinician: Referring : Treating /Extender: Weyman Pedro in Treatment: 0 Visit Information Patient Arrived: Cloyde Reams Time: 13:54 Accompanied By: Rockey Situ Transfer Assistance: None Patient Identification VerifiedVINETA, CARONE ([id number]) Secondary Verification Process Completed: Yes Patient Requires Transmission-Based No Precautions: Patient Has Alerts: Yes Patient Alerts: Dialysis MWF R ABI 1.21 TBI.88 11/02/22 LABI 1.23 TBI 1.10 History Since Last Visit Added or deleted any medications: No Any new allergies or adverse reactions: No Has Dressing in Place as Prescribed: Yes Pain Present Now: No [id number] Page 2 of 6 5/20 Electronic Signature(s) Signed: 01/15/2023 1:16:50 PM By: Midge Aver MSN RN CNS  WTA Entered By: Midge Aver on 01/14/2023 14:15:05 -------------------------------------------------------------------------------- Clinic Level of Care Assessment Details Patient Name: Date of Service: [place], [place] 01/14/2023 2:00 PM Medical Record Number: [id number] Patient Account Number: [id number] Date of Birth/Sex: Treating RN: 02-06-1961 (62 y.o. Ginette Pitman Primary Care : Grayling Congress Other Clinician: Referring : Treating /Extender: Erasmo Leventhal Weeks in Treatment: 0 Clinic Level of Care Assessment Items TOOL 2 Quantity Score X- 1 0 Use when only an EandM is performed on the INITIAL visit ASSESSMENTS - Nursing Assessment / Reassessment X- 1 20 General Physical Exam (combine w/ comprehensive assessment (listed just below) when performed on new pt. evals) X- 1 25 Comprehensive Assessment (HX, ROS, Risk Assessments, Wounds Hx, etc.) ASSESSMENTS - Wound and Skin A ssessment / Reassessment X - Simple Wound Assessment / Reassessment - one wound 1 5 []  - 0 Complex Wound Assessment / Reassessment - multiple wounds []  - 0 Dermatologic / Skin Assessment (not related to wound area) ASSESSMENTS - Ostomy and/or Continence Assessment and Care []  - 0 Incontinence Assessment and Management []  - 0 Ostomy Care Assessment and Management (repouching, etc.) PROCESS - Coordination of Care X - Simple Patient / Family Education for ongoing care 1 15 []  - 0 Complex (extensive) Patient / Family Education for ongoing care X- 1 10 Staff obtains Chiropractor, Records, T Results / Process Orders est []  - 0 Staff telephones HHA, Nursing Homes / Clarify orders / etc []  - 0 Routine Transfer to another Facility (non-emergent condition) []  - 0 Routine Hospital Admission (non-emergent condition) []  - 0 New Admissions / Manufacturing engineer / Ordering NPWT Apligraf, etc. , []  - 0 Emergency Hospital Admission (emergent condition) X- 1 10 Simple  Discharge Coordination []  - 0 Complex (extensive) Discharge Coordination HIBAH, ODONNELL F ([id number]) [id number] Page 3 of 6 PROCESS - Special Needs []  - 0 Pediatric / Minor Patient Management []  - 0 Isolation Patient Management []  - 0 Hearing / Language / Visual special needs []  - 0 Assessment of Community assistance (transportation,  D/C planning, etc.) []  - 0 Additional assistance / Altered mentation []  - 0 Support Surface(s) Assessment (bed, cushion, seat, etc.) INTERVENTIONS - Wound Cleansing / Measurement X- 1 5 Wound Imaging (photographs - any number of wounds) []  - 0 Wound Tracing (instead of photographs) []  - 0 Simple Wound Measurement - one wound []  - 0 Complex Wound Measurement - multiple wounds []  - 0 Simple Wound Cleansing - one wound []  - 0 Complex Wound Cleansing - multiple wounds INTERVENTIONS - Wound Dressings []  - 0 Small Wound Dressing one or multiple wounds []  - 0 Medium Wound Dressing one or multiple wounds []  - 0 Large Wound Dressing one or multiple wounds []  - 0 Application of Medications - injection INTERVENTIONS - Miscellaneous []  - 0 External ear exam []  - 0 Specimen Collection (cultures, biopsies, blood, body fluids, etc.) []  - 0 Specimen(s) / Culture(s) sent or taken to Lab for analysis []  - 0 Patient Transfer (multiple staff / Nurse, adult / Similar devices) []  - 0 Simple Staple / Suture removal (25 or less) []  - 0 Complex Staple / Suture removal (26 or more) []  - 0 Hypo / Hyperglycemic Management (close monitor of Blood Glucose) []  - 0 Ankle / Brachial Index (ABI) - do not check if billed separately Has the patient been seen at the hospital within the last three years: Yes Total Score: 90 Level Of Care: New/Established - Level 3 Electronic Signature(s) Signed: 01/15/2023 1:16:50 PM By: Midge Aver MSN RN CNS WTA Entered By: Midge Aver on 01/14/2023  14:35:22 -------------------------------------------------------------------------------- Lower Extremity Assessment Details Patient Name: Date of Service: Clent Ridges F. 01/14/2023 2:00 PM Medical Record Number: [id number] Patient Account Number: [id number] Date of Birth/Sex: Treating RN: 10-07-1960 (62 y.o. Ginette Pitman Primary Care : Grayling Congress Other Clinician: Referring : Treating /Extender: Erasmo Leventhal [place], Elease Hashimoto F ([id number]) [id number] Page 4 of 6 Weeks in Treatment: 0 Edema Assessment Assessed: [Left: No] [Right: No] Edema: [Left: No] [Right: No] Calf Left: Right: Point of Measurement: 34 cm From Medial Instep 34 cm 32 cm Ankle Left: Right: Point of Measurement: 11 cm From Medial Instep 20 cm 19.5 cm Knee To Floor Left: Right: From Medial Instep 40 cm 40 cm Vascular Assessment Pulses: Dorsalis Pedis Palpable: [Left:Yes] [Right:Yes] Extremity colors, hair growth, and conditions: Extremity Color: [Left:Normal] [Right:Normal] Hair Growth on Extremity: [Left:No] [Right:No] Temperature of Extremity: [Left:Warm] [Right:Warm] Capillary Refill: [Left:< 3 seconds] [Right:< 3 seconds] Dependent Rubor: [Left:No] [Right:No] Blanched when Elevated: [Left:No No] [Right:No No] Toe Nail Assessment Left: Right: Thick: Yes Yes Discolored: No No Deformed: No No Improper Length and Hygiene: No No Electronic Signature(s) Signed: 01/15/2023 1:16:50 PM By: Midge Aver MSN RN CNS WTA Entered By: Midge Aver on 01/14/2023 14:46:57 -------------------------------------------------------------------------------- Pain Assessment Details Patient Name: Date of Service: Clent Ridges F. 01/14/2023 2:00 PM Medical Record Number: [id number] Patient Account Number: [id number] Date of Birth/Sex: Treating RN: [birth year] (62 y.o. Ginette Pitman Primary Care : Grayling Congress Other Clinician: Referring  : Treating /Extender: Erasmo Leventhal Weeks in Treatment: 0 Active Problems Location of Pain Severity and Description of Pain Patient Has Paino No Site Locations [place], [place] F ([id number]) [id number] Page 5 of 6 Pain Management and Medication Current Pain Management: Electronic Signature(s) Signed: 01/15/2023 1:16:50 PM By: Midge Aver MSN RN CNS WTA Entered By: Midge Aver on 01/14/2023 13:59:26 -------------------------------------------------------------------------------- Patient/Caregiver Education Details Patient Name: Date of Service: Marikay Alar 8/1/2024andnbsp2:00 PM Medical Record Number: [id number] Patient Account Number: [id number]  Date of Birth/Gender: Treating RN: 02/25/1961 (62 y.o. Ginette Pitman Primary Care Physician: Grayling Congress Other Clinician: Referring Physician: Treating Physician/Extender: Weyman Pedro in Treatment: 0 Education Assessment Education Provided To: Patient Education Topics Provided Discharge Packet: Handouts: Other: consult only Electronic Signature(s) Signed: 01/15/2023 1:16:50 PM By: Midge Aver MSN RN CNS WTA Entered By: Midge Aver on 01/14/2023 14:37:18 Fenton Malling ([id number]) [id number] Page 6 of 6 -------------------------------------------------------------------------------- Vitals Details Patient Name: Date of Service: [place], [place] 01/14/2023 2:00 PM Medical Record Number: [id number] Patient Account Number: [id number] Date of Birth/Sex: Treating RN: 01-Apr-1961 (62 y.o. Ginette Pitman Primary Care : Grayling Congress Other Clinician: Referring : Treating /Extender: Erasmo Leventhal Weeks in Treatment: 0 Vital Signs Time Taken: 13:59 Temperature (F): 97.7 Height (in): 66 Pulse (bpm): 75 Source: Stated Respiratory Rate (breaths/min): 18 Weight (lbs):  151 Blood Pressure (mmHg): 104/71 Body Mass Index (BMI): 24.4 Reference Range: 80 - 120 mg / dl Electronic Signature(s) Signed: 01/15/2023 1:16:50 PM By: Midge Aver MSN RN CNS WTA Entered By: Midge Aver on 01/14/2023 14:00:07

## 2023-01-15 NOTE — Progress Notes (Signed)
[name], [name] ([id number]) [id number] Page 1 of 5 Visit Report for 01/14/2023 Abuse Risk Screen Details Patient Name: Date of Service: [name], [name] [name] 01/14/2023 2:00 PM Medical Record Number: [id number] Patient Account Number: [id number] Date of Birth/Sex: Treating RN: [name] (62 y.o. [name] Primary Care : [name] Other Clinician: Referring : Treating /Extender: [name] Weeks in Treatment: 0 Abuse Risk Screen Items Answer Electronic Signature(s) Signed: 01/15/2023 1:16:50 PM By: Vanessa Aver MSN RN CNS WTA Entered By: [name] on 01/14/2023 14:06:00 -------------------------------------------------------------------------------- Activities of Daily Living Details Patient Name: Date of Service: [name], [name] 01/14/2023 2:00 PM Medical Record Number: [id number] Patient Account Number: [id number] Date of Birth/Sex: Treating RN: [name] (62 y.o. [name] Primary Care : [name] Other Clinician: Referring : Treating /Extender: [name] Weeks in Treatment: 0 Activities of Daily Living Items Answer Activities of Daily Living (Please select one for each item) Drive Automobile Not Able T Medications ake Completely Able Use T elephone Completely Able Care for Appearance Completely Able Use T oilet Completely Able Bath / Shower Completely Able Dress Self Completely Able Feed Self Completely Able Walk Completely Able Get In / Out Bed Completely Able Housework Completely Able Prepare Meals Completely Able Handle Money Completely Able Shop for Self Completely [name], [name] ([id number]) [phone] [id number].pdf Page 2 of 5 Electronic Signature(s) Signed: 01/15/2023 1:16:50 PM By: Vanessa Aver MSN RN CNS WTA Entered By: [name] on 01/14/2023  14:06:23 -------------------------------------------------------------------------------- Education Screening Details Patient Name: Date of Service: Vanessa Ridges [name] 01/14/2023 2:00 PM Medical Record Number: [id number] Patient Account Number: [id number] Date of Birth/Sex: Treating RN: [name] (62 y.o. [name] Primary Care : [name] Other Clinician: Referring : Treating /Extender: [name] in Treatment: 0 Learning Preferences/Education Level/Primary Language Learning Preference: Explanation, Demonstration Highest Education Level: College or Above Preferred Language: English Cognitive Barrier Language Barrier: No Translator Needed: No Memory Deficit: No Emotional Barrier: No Cultural/Religious Beliefs Affecting Medical Care: No Physical Barrier Impaired Vision: No Impaired Hearing: No Decreased Hand dexterity: No Knowledge/Comprehension Knowledge Level: High Comprehension Level: High Ability to understand written instructions: High Ability to understand verbal instructions: High Motivation Anxiety Level: Calm Cooperation: Cooperative Education Importance: Acknowledges Need Interest in Health Problems: Asks Questions Perception: Coherent Willingness to Engage in Self-Management High Activities: Readiness to Engage in Self-Management High Activities: Electronic Signature(s) Signed: 01/15/2023 1:16:50 PM By: Vanessa Aver MSN RN CNS WTA Entered By: [name] on 01/14/2023 14:06:59 [name] ([id number]) [id number].pdf Page 3 of 5 -------------------------------------------------------------------------------- Fall Risk Assessment Details Patient Name: Date of Service: [name], [name] 01/14/2023 2:00 PM Medical Record Number: [id number] Patient Account Number: [id number] Date of Birth/Sex: Treating RN: [name] (62 y.o. [name] Primary Care :  [name] Other Clinician: Referring : Treating /Extender: [name] Weeks in Treatment: 0 Fall Risk Assessment Items Have you had 2 or more falls in the last 12 monthso 0 Yes Have you had any fall that resulted in injury in the last 12 monthso 0 Yes FALLS RISK SCREEN History of falling - immediate or within 3 months 25 Yes Secondary diagnosis (Do you have 2 or more medical diagnoseso) 15 Yes Ambulatory aid None/bed rest/wheelchair/nurse 0 No Crutches/cane/walker 15 Yes Furniture 0 No Intravenous therapy Access/Saline/Heparin Lock 0 No Gait/Transferring Normal/ bed rest/ wheelchair 0 No Weak (short steps with or without shuffle, stooped but able to lift head while walking, Vanessa  seek 0 No support from furniture) Impaired (short steps with shuffle, Vanessa have difficulty arising from chair, head down, impaired 0 No balance) Mental Status Oriented to own ability 0 Yes Electronic Signature(s) Signed: 01/15/2023 1:16:50 PM By: Vanessa Aver MSN RN CNS WTA Entered By: [name] on 01/14/2023 14:07:57 -------------------------------------------------------------------------------- Foot Assessment Details Patient Name: Date of Service: Vanessa Ridges [name] 01/14/2023 2:00 PM Medical Record Number: [id number] Patient Account Number: [id number] Date of Birth/Sex: Treating RN: [name] (62 y.o. [name] Primary Care : [name] Other Clinician: Referring : Treating /Extender: [name] Weeks in Treatment: 0 Foot Assessment Items Site Locations [place], [name] ([id number]) [id number] Page 4 of 5 + = Sensation present, - = Sensation absent, C = Callus, U = Ulcer R = Redness, W = Warmth, M = Maceration, PU = Pre-ulcerative lesion [name] = Fissure, S = Swelling, D = Dryness Assessment Right: Left: Other Deformity: No No Prior Foot Ulcer: No No Prior Amputation: No  No Charcot Joint: Yes No Ambulatory Status: Ambulatory With Help Assistance Device: Walker G[employer]utures trader) Signed: 01/15/2023 1:16:50 PM By: Vanessa Aver MSN RN CNS WTA Entered By: [name] on 01/14/2023 14:09:41 -------------------------------------------------------------------------------- Nutrition Risk Screening Details Patient Name: Date of Service: Vanessa Ridges [name] 01/14/2023 2:00 PM Medical Record Number: [id number] Patient Account Number: [id number] Date of Birth/Sex: Treating RN: [birth year] (62 y.o. [name] Primary Care : [name] Other Clinician: Referring : Treating /Extender: [name] Weeks in Treatment: 0 Height (in): 66 Weight (lbs): 151 Body Mass Index (BMI): 24.4 Nutrition Risk Screening Items Score Screening NUTRITION RISK SCREEN: I have an illness or condition that made me change the kind and/or amount of food I eat 0 No I eat fewer than two meals per day 0 No I eat few fruits and vegetables, or milk products 0 No I have three or more drinks of beer, liquor or wine almost every day 0 No I have tooth or mouth problems that make it hard for me to eat 0 No [name], [name] ([id number]) [id number] [id number].pdf Page 5 of 5 I don't always have enough money to buy the food I need 0 No I eat alone most of the time 0 No I take three or more different prescribed or over-the-counter drugs a day 1 Yes Without wanting to, I have lost or gained 10 pounds in the last six months 0 No I am not always physically able to shop, cook and/or feed myself 0 No Nutrition Protocols Good Risk Protocol 0 No interventions needed Moderate Risk Protocol High Risk Proctocol Risk Level: Good Risk Score: 1 Electronic Signature(s) Signed: 01/15/2023 1:16:50 PM By: Vanessa Aver MSN RN CNS WTA Entered By: [name] on 01/14/2023 14:08:16

## 2023-01-15 NOTE — Procedures (Signed)
PROCEDURE SUMMARY:  Successful image-guided paracentesis from the right lower abdomen.  Yielded 2.1 liters of amber fluid.  No immediate complications.  EBL = trace. Patient tolerated well.   Specimen was not sent for labs.  Please see imaging section of Epic for full dictation.   Kennieth Francois PA-C 01/15/2023 12:39 PM

## 2023-01-15 NOTE — H&P (View-Only) (Signed)
 Subjective:    Patient ID: [name], female    DOB: 12/17/1960, 62 y.o.   MRN: [id number] Chief Complaint  Patient presents with   Follow-up    6 weeks followup with HDA    The patient returns to the office for followup status post intervention of the dialysis access including a left basilic vein transposition.  Following the intervention the excess function was unchanged per the patient.  The patient continues to be experiencing increased bleeding times following decannulation and increased recirculation with diminished efficiency of their dialysis. The patient denies an increase in arm swelling. At the present time the patient denies hand pain.  No recent shortening of the patient's walking distance or new symptoms consistent with claudication.  No history of rest pain symptoms. No new ulcers or wounds of the lower extremities have occurred.  The patient denies amaurosis fugax or recent TIA symptoms. There are no recent neurological changes noted. There is no history of DVT, PE or superficial thrombophlebitis. No recent episodes of angina or shortness of breath documented.   Duplex ultrasound of the AV access shows a patent access.  The previously noted stenosis is not improved compared to last study.  Flow volume today is 1093 cc/min (previous flow volume was 1201 cc/min)    Review of Systems  Neurological:  Positive for weakness.  Hematological:  Bruises/bleeds easily.  All other systems reviewed and are negative.      Objective:   Physical Exam Vitals reviewed.  HENT:     Head: Normocephalic.  Cardiovascular:     Rate and Rhythm: Normal rate.     Pulses:          Radial pulses are 1+ on the right side and 1+ on the left side.     Arteriovenous access: Left arteriovenous access is present.    Comments: Diminished proximal bruit Pulmonary:     Effort: Pulmonary effort is normal.  Skin:    General: Skin is warm and dry.  Neurological:     Mental Status: She  is alert and oriented to person, place, and time.  Psychiatric:        Mood and Affect: Mood normal.        Behavior: Behavior normal.        Thought Content: Thought content normal.        Judgment: Judgment normal.     BP 104/68 (BP Location: Right Arm)   Pulse 76   Resp 18   Ht 5\' 6"  (1.676 m)   Wt 156 lb (70.8 kg)   BMI 25.18 kg/m   Past Medical History:  Diagnosis Date   Adult behavior problems    frontal lobe CVA   Anemia    chronic disease   Atrial flutter by electrocardiogram (HCC) 03/22/2014   Cardiomyopathy (HCC)    CHF (congestive heart failure) (HCC)    Chicken pox    Chronic respiratory failure with hypoxia (HCC) 12/19/2021   Coronary artery disease    Delayed surgical wound healing 04/12/2014   Overview:  Left leg   GERD (gastroesophageal reflux disease)    H/O atrial flutter 04/12/2014   H/O stroke without residual deficits 02/13/2014   H/O: substance abuse (HCC) 02/27/2014   Heart failure (HCC)    Hepatitis    history of hep c   History of CVA (cerebrovascular accident) 02/21/2014   History of delirium 04/12/2014   Hyperkalemia 11/13/2014   Hyperlipidemia    Hypertension    Hypotension 02/24/2014  Leukocytosis 02/26/2014   Myocardial infarction [facility])    Obesity    Paroxysmal atrial fibrillation (HCC)    Peripheral vascular disease (HCC)    Postoperative anemia due to acute blood loss 02/27/2014   Renal failure    Renal insufficiency    Stroke [facility]) 2011   Superficial incisional surgical site infection 03/14/2014   Syncope and collapse 07/25/2020    Social History   Socioeconomic History   Marital status: Single    Spouse name: Not on file   Number of children: Not on file   Years of education: Not on file   Highest education level: Not on file  Occupational History   Not on file  Tobacco Use   Smoking status: Some Days    Current packs/day: 0.25    Average packs/day: 0.3 packs/day for 20.0 years (5.0 ttl pk-yrs)    Types:  Cigarettes   Smokeless tobacco: Never   Tobacco comments:    smokes two cigarettes a day  Vaping Use   Vaping status: Never Used  Substance and Sexual Activity   Alcohol use: No   Drug use: No   Sexual activity: Not Currently    Birth control/protection: Post-menopausal  Other Topics Concern   Not on file  Social History Narrative   Not on file   Social Determinants of Health   Financial Resource Strain: Not on file  Food Insecurity: Not on file  Transportation Needs: Not on file  Physical Activity: Not on file  Stress: Not on file  Social Connections: Not on file  Intimate Partner Violence: Not on file    Past Surgical History:  Procedure Laterality Date   A/V FISTULAGRAM Left 03/30/2019   Procedure: A/V FISTULAGRAM;  Surgeon: Annice Needy, MD;  Location: [facility];  Service: Cardiovascular;  Laterality: Left;   A/V FISTULAGRAM Left 10/06/2019   Procedure: A/V FISTULAGRAM;  Surgeon: Annice Needy, MD;  Location: [facility];  Service: Cardiovascular;  Laterality: Left;   A/V FISTULAGRAM Left 05/19/2021   Procedure: A/V FISTULAGRAM;  Surgeon: Annice Needy, MD;  Location: [facility];  Service: Cardiovascular;  Laterality: Left;   A/V FISTULAGRAM Left 01/29/2022   Procedure: A/V Fistulagram;  Surgeon: Annice Needy, MD;  Location: [facility];  Service: Cardiovascular;  Laterality: Left;   A/V FISTULAGRAM Left 04/30/2022   Procedure: A/V Fistulagram;  Surgeon: Annice Needy, MD;  Location: [facility];  Service: Cardiovascular;  Laterality: Left;   A/V FISTULAGRAM Left 08/20/2022   Procedure: A/V Fistulagram;  Surgeon: Annice Needy, MD;  Location: [facility];  Service: Cardiovascular;  Laterality: Left;   A/V FISTULAGRAM N/A 12/03/2022   Procedure: A/V Fistulagram;  Surgeon: Annice Needy, MD;  Location: [facility];  Service: Cardiovascular;  Laterality: N/A;   COLONOSCOPY     COLONOSCOPY WITH PROPOFOL N/A 04/22/2021    Procedure: COLONOSCOPY WITH PROPOFOL;  Surgeon: Regis Bill, MD;  Location: [facility] ENDOS[facility];  Service: Endoscopy;  Laterality: N/A;   CORONARY ANGIOPLASTY WITH STENT PLACEMENT  2013   CORONARY ARTERY BYPASS GRAFT     DIALYSIS FISTULA CREATION     ESOPHAGOGASTRODUODENOSCOPY     ESOPHAGOGASTRODUODENOSCOPY N/A 04/22/2021   Procedure: ESOPHAGOGASTRODUODENOSCOPY (EGD);  Surgeon: Regis Bill, MD;  Location: [facility] ENDOSCOPY;  Service: Endoscopy;  Laterality: N/A;   FLEXIBLE SIGMOIDOSCOPY N/A 02/23/2019   Procedure: FLEXIBLE SIGMOIDOSCOPY;  Surgeon: Christena Deem, MD;  Location: [facility] ENDOSCOPY;  Service:  Endoscopy;  Laterality: N/A;   LEFT HEART CATH AND CORS/GRAFTS ANGIOGRAPHY N/A 08/08/2018   Procedure: LEFT HEART CATH AND CORS/GRAFTS ANGIOGRAPHY;  Surgeon: Laurier Nancy, MD;  Location: [facility];  Service: Cardiovascular;  Laterality: N/A;   LEFT HEART CATH AND CORS/GRAFTS ANGIOGRAPHY N/A 01/30/2019   Procedure: LEFT HEART CATH AND CORS/GRAFTS ANGIOGRAPHY;  Surgeon: Laurier Nancy, MD;  Location: [facility];  Service: Cardiovascular;  Laterality: N/A;   percutaneous insertion intra aortic balloon right cath     ultrasound guided pericardiocentesis      Family History  Problem Relation Age of Onset   Hypertension Mother    Ovarian cancer Mother    Diabetes type II Mother    Hypertension Father    Kidney disease Father    Hypertension Sister    Diabetes type II Maternal Grandmother    Breast cancer Maternal Grandmother    Breast cancer Maternal Aunt    Hypertension Other    Cancer Other    Renal Disease Other     Allergies  Allergen Reactions   Morphine And Codeine Hives   Levaquin [Levofloxacin] Itching    Severe itching; prickly sensation   Enalapril Other (See Comments)    hallucinations   Other    Latex Rash   Tape Rash       Latest Ref Rng & Units 02/16/2022   11:55 PM 02/16/2022    6:42 PM 01/30/2022    9:56 AM  CBC  WBC 4.0 -  10.5 K/uL 6.8  6.5  3.8   Hemoglobin 12.0 - 15.0 g/dL 16.1  09.6  04.5   Hematocrit 36.0 - 46.0 % 34.5  39.9  32.8   Platelets 150 - 400 K/uL 140  146  127       CMP     Component Value Date/Time   NA 138 02/16/2022 2355   NA 139 07/09/2014 0503   K 5.2 (H) 02/16/2022 2355   K 4.7 07/11/2014 0958   CL 102 02/16/2022 2355   CL 98 07/09/2014 0503   CO2 22 02/16/2022 2355   CO2 30 07/09/2014 0503   GLUCOSE 70 02/16/2022 2355   GLUCOSE 107 (H) 07/09/2014 0503   BUN 43 (H) 02/16/2022 2355   BUN 39 (H) 07/09/2014 0503   CREATININE 8.45 (H) 02/16/2022 2355   CREATININE 5.53 (H) 07/09/2014 0503   CALCIUM 8.1 (L) 02/16/2022 2355   CALCIUM 8.3 (L) 07/09/2014 0503   PROT 6.7 02/16/2022 2215   PROT 8.4 (H) 07/07/2014 2207   ALBUMIN 3.5 02/16/2022 2355   ALBUMIN 3.1 (L) 07/07/2014 2207   AST 21 02/16/2022 2215   AST 76 (H) 07/07/2014 2207   ALT 10 02/16/2022 2215   ALT 41 07/07/2014 2207   ALKPHOS 63 02/16/2022 2215   ALKPHOS 107 07/07/2014 2207   BILITOT 1.0 02/16/2022 2215   BILITOT 0.7 07/07/2014 2207   GFRNONAA 5 (L) 02/16/2022 2355   GFRNONAA 9 (L) 07/09/2014 0503   GFRNONAA 9 (L) 02/11/2014 0300     No results found.     Assessment & Plan:   1. ESRD (end stage renal disease) (HCC) Recommend:  The patient is experiencing increasing problems with their dialysis access.  Patient should have a fistulagram with the intention for intervention.  The intention for intervention is to restore appropriate flow and prevent thrombosis and possible loss of the access.  As well as improve the quality of dialysis therapy.  The risks, benefits and alternative therapies  were reviewed in detail with the patient.  All questions were answered.  The patient agrees to proceed with angio/intervention.    The patient will follow up with me in the office after the procedure.   2. Primary hypertension Continue antihypertensive medications as already ordered, these medications have been  reviewed and there are no changes at this time.  3. Hyperlipidemia, unspecified hyperlipidemia type Continue statin as ordered and reviewed, no changes at this time   Current Outpatient Medications on File Prior to Visit  Medication Sig Dispense Refill   albuterol (VENTOLIN HFA) 108 (90 Base) MCG/ACT inhaler Inhale 1-2 puffs into the lungs every 6 (six) hours as needed for wheezing or shortness of breath. 20.1 g 3   amiodarone (PACERONE) 200 MG tablet Take 200 mg by mouth daily.     apixaban (ELIQUIS) 5 MG TABS tablet Take 5 mg by mouth 2 (two) times daily.     Bismuth Tribromoph-Petrolatum (XEROFORM PETROLAT PATCH 2"X2") PADS Apply 1 each topically every 3 (three) days. 25 each 0   colchicine 0.6 MG tablet Take 0.6 mg by mouth daily.     hydrOXYzine (ATARAX/VISTARIL) 50 MG tablet Take 50 mg by mouth at bedtime.     levothyroxine (SYNTHROID) 75 MCG tablet Take 75 mcg by mouth daily before breakfast.      lidocaine-prilocaine (EMLA) cream Apply 1 application topically daily as needed (port access).     midodrine (PROAMATINE) 10 MG tablet Take 1 tablet (10 mg total) by mouth 3 (three) times a week. Monday, Wednesday, Friday with hemodialysis     omeprazole (PRILOSEC) 20 MG capsule Take 20 mg by mouth daily.     oxyCODONE-acetaminophen (PERCOCET/ROXICET) 5-325 MG tablet Take 1 tablet by mouth every 6 (six) hours as needed for severe pain. 20 tablet 0   pantoprazole (PROTONIX) 40 MG tablet Take 40 mg by mouth daily.     rosuvastatin (CRESTOR) 20 MG tablet Take 20 mg by mouth at bedtime.     sevelamer carbonate (RENVELA) 800 MG tablet Take 3,200 mg by mouth 3 (three) times daily with meals.     VELTASSA 8.4 g packet Take 1 packet by mouth daily.     No current facility-administered medications on file prior to visit.    There are no Patient Instructions on file for this visit. No follow-ups on file.   Georgiana Spinner, NP

## 2023-01-15 NOTE — Progress Notes (Signed)
Subjective:    Patient ID: [name], female    DOB: 12/17/1960, 62 y.o.   MRN: [id number] Chief Complaint  Patient presents with   Follow-up    6 weeks followup with HDA    The patient returns to the office for followup status post intervention of the dialysis access including a left basilic vein transposition.  Following the intervention the excess function was unchanged per the patient.  The patient continues to be experiencing increased bleeding times following decannulation and increased recirculation with diminished efficiency of their dialysis. The patient denies an increase in arm swelling. At the present time the patient denies hand pain.  No recent shortening of the patient's walking distance or new symptoms consistent with claudication.  No history of rest pain symptoms. No new ulcers or wounds of the lower extremities have occurred.  The patient denies amaurosis fugax or recent TIA symptoms. There are no recent neurological changes noted. There is no history of DVT, PE or superficial thrombophlebitis. No recent episodes of angina or shortness of breath documented.   Duplex ultrasound of the AV access shows a patent access.  The previously noted stenosis is not improved compared to last study.  Flow volume today is 1093 cc/min (previous flow volume was 1201 cc/min)    Review of Systems  Neurological:  Positive for weakness.  Hematological:  Bruises/bleeds easily.  All other systems reviewed and are negative.      Objective:   Physical Exam Vitals reviewed.  HENT:     Head: Normocephalic.  Cardiovascular:     Rate and Rhythm: Normal rate.     Pulses:          Radial pulses are 1+ on the right side and 1+ on the left side.     Arteriovenous access: Left arteriovenous access is present.    Comments: Diminished proximal bruit Pulmonary:     Effort: Pulmonary effort is normal.  Skin:    General: Skin is warm and dry.  Neurological:     Mental Status: She  is alert and oriented to person, place, and time.  Psychiatric:        Mood and Affect: Mood normal.        Behavior: Behavior normal.        Thought Content: Thought content normal.        Judgment: Judgment normal.     BP 104/68 (BP Location: Right Arm)   Pulse 76   Resp 18   Ht 5\' 6"  (1.676 m)   Wt 156 lb (70.8 kg)   BMI 25.18 kg/m   Past Medical History:  Diagnosis Date   Adult behavior problems    frontal lobe CVA   Anemia    chronic disease   Atrial flutter by electrocardiogram (HCC) 03/22/2014   Cardiomyopathy (HCC)    CHF (congestive heart failure) (HCC)    Chicken pox    Chronic respiratory failure with hypoxia (HCC) 12/19/2021   Coronary artery disease    Delayed surgical wound healing 04/12/2014   Overview:  Left leg   GERD (gastroesophageal reflux disease)    H/O atrial flutter 04/12/2014   H/O stroke without residual deficits 02/13/2014   H/O: substance abuse (HCC) 02/27/2014   Heart failure (HCC)    Hepatitis    history of hep c   History of CVA (cerebrovascular accident) 02/21/2014   History of delirium 04/12/2014   Hyperkalemia 11/13/2014   Hyperlipidemia    Hypertension    Hypotension 02/24/2014  Leukocytosis 02/26/2014   Myocardial infarction [facility])    Obesity    Paroxysmal atrial fibrillation (HCC)    Peripheral vascular disease (HCC)    Postoperative anemia due to acute blood loss 02/27/2014   Renal failure    Renal insufficiency    Stroke [facility]) 2011   Superficial incisional surgical site infection 03/14/2014   Syncope and collapse 07/25/2020    Social History   Socioeconomic History   Marital status: Single    Spouse name: Not on file   Number of children: Not on file   Years of education: Not on file   Highest education level: Not on file  Occupational History   Not on file  Tobacco Use   Smoking status: Some Days    Current packs/day: 0.25    Average packs/day: 0.3 packs/day for 20.0 years (5.0 ttl pk-yrs)    Types:  Cigarettes   Smokeless tobacco: Never   Tobacco comments:    smokes two cigarettes a day  Vaping Use   Vaping status: Never Used  Substance and Sexual Activity   Alcohol use: No   Drug use: No   Sexual activity: Not Currently    Birth control/protection: Post-menopausal  Other Topics Concern   Not on file  Social History Narrative   Not on file   Social Determinants of Health   Financial Resource Strain: Not on file  Food Insecurity: Not on file  Transportation Needs: Not on file  Physical Activity: Not on file  Stress: Not on file  Social Connections: Not on file  Intimate Partner Violence: Not on file    Past Surgical History:  Procedure Laterality Date   A/V FISTULAGRAM Left 03/30/2019   Procedure: A/V FISTULAGRAM;  Surgeon: Annice Needy, MD;  Location: [facility];  Service: Cardiovascular;  Laterality: Left;   A/V FISTULAGRAM Left 10/06/2019   Procedure: A/V FISTULAGRAM;  Surgeon: Annice Needy, MD;  Location: [facility];  Service: Cardiovascular;  Laterality: Left;   A/V FISTULAGRAM Left 05/19/2021   Procedure: A/V FISTULAGRAM;  Surgeon: Annice Needy, MD;  Location: [facility];  Service: Cardiovascular;  Laterality: Left;   A/V FISTULAGRAM Left 01/29/2022   Procedure: A/V Fistulagram;  Surgeon: Annice Needy, MD;  Location: [facility];  Service: Cardiovascular;  Laterality: Left;   A/V FISTULAGRAM Left 04/30/2022   Procedure: A/V Fistulagram;  Surgeon: Annice Needy, MD;  Location: [facility];  Service: Cardiovascular;  Laterality: Left;   A/V FISTULAGRAM Left 08/20/2022   Procedure: A/V Fistulagram;  Surgeon: Annice Needy, MD;  Location: [facility];  Service: Cardiovascular;  Laterality: Left;   A/V FISTULAGRAM N/A 12/03/2022   Procedure: A/V Fistulagram;  Surgeon: Annice Needy, MD;  Location: [facility];  Service: Cardiovascular;  Laterality: N/A;   COLONOSCOPY     COLONOSCOPY WITH PROPOFOL N/A 04/22/2021    Procedure: COLONOSCOPY WITH PROPOFOL;  Surgeon: Regis Bill, MD;  Location: [facility]OS[facility];  Service: Endoscopy;  Laterality: N/A;   CORONARY ANGIOPLASTY WITH STENT PLACEMENT  2013   CORONARY ARTERY BYPASS GRAFT     DIALYSIS FISTULA CREATION     ESOPHAGOGASTRODUODENOSCOPY     ESOPHAGOGASTRODUODENOSCOPY N/A 04/22/2021   Procedure: ESOPHAGOGASTRODUODENOSCOPY (EGD);  Surgeon: Regis Bill, MD;  Location: [facility] ENDOSCOPY;  Service: Endoscopy;  Laterality: N/A;   FLEXIBLE SIGMOIDOSCOPY N/A 02/23/2019   Procedure: FLEXIBLE SIGMOIDOSCOPY;  Surgeon: Christena Deem, MD;  Location: [facility] ENDOSCOPY;  Service:  Endoscopy;  Laterality: N/A;   LEFT HEART CATH AND CORS/GRAFTS ANGIOGRAPHY N/A 08/08/2018   Procedure: LEFT HEART CATH AND CORS/GRAFTS ANGIOGRAPHY;  Surgeon: Laurier Nancy, MD;  Location: [facility];  Service: Cardiovascular;  Laterality: N/A;   LEFT HEART CATH AND CORS/GRAFTS ANGIOGRAPHY N/A 01/30/2019   Procedure: LEFT HEART CATH AND CORS/GRAFTS ANGIOGRAPHY;  Surgeon: Laurier Nancy, MD;  Location: [facility];  Service: Cardiovascular;  Laterality: N/A;   percutaneous insertion intra aortic balloon right cath     ultrasound guided pericardiocentesis      Family History  Problem Relation Age of Onset   Hypertension Mother    Ovarian cancer Mother    Diabetes type II Mother    Hypertension Father    Kidney disease Father    Hypertension Sister    Diabetes type II Maternal Grandmother    Breast cancer Maternal Grandmother    Breast cancer Maternal Aunt    Hypertension Other    Cancer Other    Renal Disease Other     Allergies  Allergen Reactions   Morphine And Codeine Hives   Levaquin [Levofloxacin] Itching    Severe itching; prickly sensation   Enalapril Other (See Comments)    hallucinations   Other    Latex Rash   Tape Rash       Latest Ref Rng & Units 02/16/2022   11:55 PM 02/16/2022    6:42 PM 01/30/2022    9:56 AM  CBC  WBC 4.0 -  10.5 K/uL 6.8  6.5  3.8   Hemoglobin 12.0 - 15.0 g/dL 16.1  09.6  04.5   Hematocrit 36.0 - 46.0 % 34.5  39.9  32.8   Platelets 150 - 400 K/uL 140  146  127       CMP     Component Value Date/Time   NA 138 02/16/2022 2355   NA 139 07/09/2014 0503   K 5.2 (H) 02/16/2022 2355   K 4.7 07/11/2014 0958   CL 102 02/16/2022 2355   CL 98 07/09/2014 0503   CO2 22 02/16/2022 2355   CO2 30 07/09/2014 0503   GLUCOSE 70 02/16/2022 2355   GLUCOSE 107 (H) 07/09/2014 0503   BUN 43 (H) 02/16/2022 2355   BUN 39 (H) 07/09/2014 0503   CREATININE 8.45 (H) 02/16/2022 2355   CREATININE 5.53 (H) 07/09/2014 0503   CALCIUM 8.1 (L) 02/16/2022 2355   CALCIUM 8.3 (L) 07/09/2014 0503   PROT 6.7 02/16/2022 2215   PROT 8.4 (H) 07/07/2014 2207   ALBUMIN 3.5 02/16/2022 2355   ALBUMIN 3.1 (L) 07/07/2014 2207   AST 21 02/16/2022 2215   AST 76 (H) 07/07/2014 2207   ALT 10 02/16/2022 2215   ALT 41 07/07/2014 2207   ALKPHOS 63 02/16/2022 2215   ALKPHOS 107 07/07/2014 2207   BILITOT 1.0 02/16/2022 2215   BILITOT 0.7 07/07/2014 2207   GFRNONAA 5 (L) 02/16/2022 2355   GFRNONAA 9 (L) 07/09/2014 0503   GFRNONAA 9 (L) 02/11/2014 0300     No results found.     Assessment & Plan:   1. ESRD (end stage renal disease) (HCC) Recommend:  The patient is experiencing increasing problems with their dialysis access.  Patient should have a fistulagram with the intention for intervention.  The intention for intervention is to restore appropriate flow and prevent thrombosis and possible loss of the access.  As well as improve the quality of dialysis therapy.  The risks, benefits and alternative therapies  were reviewed in detail with the patient.  All questions were answered.  The patient agrees to proceed with angio/intervention.    The patient will follow up with me in the office after the procedure.   2. Primary hypertension Continue antihypertensive medications as already ordered, these medications have been  reviewed and there are no changes at this time.  3. Hyperlipidemia, unspecified hyperlipidemia type Continue statin as ordered and reviewed, no changes at this time   Current Outpatient Medications on File Prior to Visit  Medication Sig Dispense Refill   albuterol (VENTOLIN HFA) 108 (90 Base) MCG/ACT inhaler Inhale 1-2 puffs into the lungs every 6 (six) hours as needed for wheezing or shortness of breath. 20.1 g 3   amiodarone (PACERONE) 200 MG tablet Take 200 mg by mouth daily.     apixaban (ELIQUIS) 5 MG TABS tablet Take 5 mg by mouth 2 (two) times daily.     Bismuth Tribromoph-Petrolatum (XEROFORM PETROLAT PATCH 2"X2") PADS Apply 1 each topically every 3 (three) days. 25 each 0   colchicine 0.6 MG tablet Take 0.6 mg by mouth daily.     hydrOXYzine (ATARAX/VISTARIL) 50 MG tablet Take 50 mg by mouth at bedtime.     levothyroxine (SYNTHROID) 75 MCG tablet Take 75 mcg by mouth daily before breakfast.      lidocaine-prilocaine (EMLA) cream Apply 1 application topically daily as needed (port access).     midodrine (PROAMATINE) 10 MG tablet Take 1 tablet (10 mg total) by mouth 3 (three) times a week. Monday, Wednesday, Friday with hemodialysis     omeprazole (PRILOSEC) 20 MG capsule Take 20 mg by mouth daily.     oxyCODONE-acetaminophen (PERCOCET/ROXICET) 5-325 MG tablet Take 1 tablet by mouth every 6 (six) hours as needed for severe pain. 20 tablet 0   pantoprazole (PROTONIX) 40 MG tablet Take 40 mg by mouth daily.     rosuvastatin (CRESTOR) 20 MG tablet Take 20 mg by mouth at bedtime.     sevelamer carbonate (RENVELA) 800 MG tablet Take 3,200 mg by mouth 3 (three) times daily with meals.     VELTASSA 8.4 g packet Take 1 packet by mouth daily.     No current facility-administered medications on file prior to visit.    There are no Patient Instructions on file for this visit. No follow-ups on file.   Georgiana Spinner, NP

## 2023-01-15 NOTE — Progress Notes (Signed)
SKYAH, HANNON ([id number]) [id number] Page 1 of 7 Visit Report for 01/14/2023 Chief Complaint Document Details Patient Name: Date of Service: [name] 01/14/2023 2:00 PM Medical Record Number: [id number] Patient Account Number: [id number] Date of Birth/Sex: Treating RN: [birth year][name] (62 y.o. [name] Primary Care Provider: Grayling Congress Other Clinician: Referring Provider: Treating Provider/Extender: Erasmo Leventhal Weeks in Treatment: 0 Information Obtained from: Patient Chief Complaint Bilateral LE Edema Electronic Signature(s) Signed: 01/14/2023 2:32:05 PM By: Allen Derry PA-C Entered By: Allen Derry on [name] -------------------------------------------------------------------------------- HPI Details Patient Name: Date of Service: [name], [name] 01/14/2023 2:00 PM Medical Record Number: [id number] Patient Account Number: [id number] Date of Birth/Sex: Treating RN: [name], [name] (62 y.o. [name] Primary Care Provider: Grayling Congress Other Clinician: Referring Provider: Treating Provider/Extender: Erasmo Leventhal Weeks in Treatment: 0 History of Present Illness ssociated Signs and Symptoms: 11/02/22 Volanda Napoleon 1.21/TBI 0.88 Abi L 1.23/TBI 1.10 A HPI Description: 01-14-2023 upon evaluation today patient presents for readmission to the clinic although she was here last in 2015. With that being said I do think that at this point this is pretty much a completely Vanessa admission. The good news is her wound that she was coming for actually appears to be healed although it has been open she tells me it just recently closed in the last couple of days. The bad news is she does have a lot of issues with swelling in particular and I do believe that she is going to require ongoing compression therapy to keep everything under control. She voiced understanding. We will discuss that further in the plan. Patient does  have a history of chronic venous insufficiency bilaterally, coronary artery disease, end-stage renal disease with dependence on renal dialysis, and a personal history of stroke without any obvious residual deficits Electronic Signature(s) Signed: 01/14/2023 4:10:29 PM By: Allen Derry PA-C Entered By: Allen Derry on Vanessa 16:10:28 [name] ([id number]) [id number] Page 2 of 7 -------------------------------------------------------------------------------- Physical Exam Details Patient Name: Date of Service: [name], [name] 01/14/2023 2:00 PM Medical Record Number: [id number] Patient Account Number: [id number] Date of Birth/Sex: Treating RN: [birth year] (62 y.o. [name] Primary Care Provider: Grayling Congress Other Clinician: Referring Provider: Treating Provider/Extender: Erasmo Leventhal Weeks in Treatment: 0 Constitutional sitting or standing blood pressure is within target range for patient.. pulse regular and within target range for patient.Marland Kitchen respirations regular, non-labored and within target range for patient.Marland Kitchen temperature within target range for patient.. Well-nourished and well-hydrated in no acute distress. Eyes conjunctiva clear no eyelid edema noted. pupils equal round and reactive to light and accommodation. Ears, Nose, Mouth, and Throat no gross abnormality of ear auricles or external auditory canals. normal hearing noted during conversation. mucus membranes moist. Respiratory normal breathing without difficulty. Cardiovascular 2+ dorsalis pedis/posterior tibialis pulses. trace pitting edema of the bilateral lower extremities. Musculoskeletal normal gait and posture. no significant deformity or arthritic changes, no loss or range of motion, no clubbing. Psychiatric this patient is able to make decisions and demonstrates good insight into disease process. Alert and Oriented x 3. pleasant and cooperative. Notes Upon  inspection patient's legs on the left actually showed an area where she did have an obvious sign that there was a previous ulceration here although this is very well-healed and there is no signs of anything threatening to reopen I actually think that she did a great job taking care of this at home and on her own. With that being  said she unfortunately no she has good blood flow and seems to have a good ABI/TBI as well which was noted and documented does seem to have some issues here with chronic swelling probably secondary to her dialysis to some degree. Electronic Signature(s) Signed: 01/14/2023 4:24:18 PM By: Allen Derry PA-C Entered By: Allen Derry on Vanessa 16:24:18 -------------------------------------------------------------------------------- Problem List Details Patient Name: Date of Service: [name], [name] 01/14/2023 2:00 PM Medical Record Number: [id number] Patient Account Number: [id number] Date of Birth/Sex: Treating RN: [birth year] [name] (62 y.o. [name] Primary Care Provider: Grayling Congress Other Clinician: Referring Provider: Treating Provider/Extender: Weyman Pedro in Treatment: 0 [name] ([id number]) [id number] Page 3 of 7 Active Problems ICD-10 Encounter Code Description Active Date MDM Diagnosis I87.323 Chronic venous hypertension (idiopathic) with inflammation of bilateral lower 01/14/2023 No Yes extremity I25.10 Atherosclerotic heart disease of native coronary artery without angina pectoris 01/14/2023 No Yes N18.6 End stage renal disease 01/14/2023 No Yes Z99.2 Dependence on renal dialysis 01/14/2023 No Yes Z86.73 Personal history of transient ischemic attack (TIA), and cerebral infarction 01/14/2023 No Yes without residual deficits Inactive Problems Resolved Problems Electronic Signature(s) Signed: 01/14/2023 4:48:59 PM By: Allen Derry PA-C Signed: 01/15/2023 1:16:50 PM By: Midge Aver MSN RN CNS  WTA Previous Signature: 01/14/2023 2:31:47 PM Version By: Allen Derry PA-C Entered By: Midge Aver on Vanessa 14:37:40 -------------------------------------------------------------------------------- Progress Note Details Patient Name: Date of Service: Vanessa Ridges [name] 01/14/2023 2:00 PM Medical Record Number: [id number] Patient Account Number: [id number] Date of Birth/Sex: Treating RN: [name] (62 y.o. [name] Primary Care Provider: Grayling Congress Other Clinician: Referring Provider: Treating Provider/Extender: Erasmo Leventhal Weeks in Treatment: 0 Subjective Chief Complaint Information obtained from Patient Bilateral LE Edema History of Present Illness (HPI) The following HPI elements were documented for the patient's wound: Associated Signs and Symptoms: 11/02/22 Vanice Sarah R 1.21/TBI 0.88 Abi L 1.23/TBI 1.10 01-14-2023 upon evaluation today patient presents for readmission to the clinic although she was here last in 2015. With that being said I do think that at this point this is pretty much a completely Vanessa admission. The good news is her wound that she was coming for actually appears to be healed although it has been open she tells me it just recently closed in the last couple of days. The bad news is she does have a lot of issues with swelling in particular and I do believe that she is going to require ongoing compression therapy to keep everything under control. She voiced understanding. We will discuss that further in the plan. [name], [name] ([id number]) [id number] Page 4 of 7 Patient does have a history of chronic venous insufficiency bilaterally, coronary artery disease, end-stage renal disease with dependence on renal dialysis, and a personal history of stroke without any obvious residual deficits Patient History Information obtained from Patient. Allergies morphine, latex, adhesive, Levaquin (Reaction: itching) Social  History Current some day smoker, Marital Status - Single, Alcohol Use - Rarely, Drug Use - No History, Caffeine Use - Moderate. Medical History Cardiovascular Patient has history of Coronary Artery Disease, Hypertension, Hypotension, Myocardial Infarction Endocrine Denies history of Type I Diabetes, Type II Diabetes Genitourinary Patient has history of End Stage Renal Disease Integumentary (Skin) Denies history of History of Burn, History of pressure wounds Medical A Surgical History Notes nd Constitutional Symptoms (General Health) Triple by-pass surgery 6 weeks ago, End Stage Renal disease; Stoke, Neurologic CVA 05/2010 Review of Systems (ROS) Constitutional Symptoms (General Health) Denies complaints or  symptoms of Fatigue, Fever, Chills, Marked Weight Change. Eyes Denies complaints or symptoms of Dry Eyes, Vision Changes, Glasses / Contacts. Ear/Nose/Mouth/Throat Denies complaints or symptoms of Difficult clearing ears, Sinusitis. Hematologic/Lymphatic Denies complaints or symptoms of Bleeding / Clotting Disorders, Human Immunodeficiency Virus. Respiratory Denies complaints or symptoms of Chronic or frequent coughs, Shortness of Breath. Cardiovascular Denies complaints or symptoms of LE edema. Endocrine Complains or has symptoms of Thyroid disease. Denies complaints or symptoms of Hepatitis, Polydypsia (Excessive Thirst). Genitourinary Complains or has symptoms of Kidney failure/ Dialysis - MWF. Integumentary (Skin) Denies complaints or symptoms of Wounds, Bleeding or bruising tendency, Breakdown, Swelling. Musculoskeletal Denies complaints or symptoms of Muscle Pain, Muscle Weakness. Psychiatric Denies complaints or symptoms of Anxiety, Claustrophobia. Objective Constitutional sitting or standing blood pressure is within target range for patient.. pulse regular and within target range for patient.Marland Kitchen respirations regular, non-labored and within target range for  patient.Marland Kitchen temperature within target range for patient.. Well-nourished and well-hydrated in no acute distress. Vitals Time Taken: 1:59 PM, Height: 66 in, Source: Stated, Weight: 151 lbs, BMI: 24.4, Temperature: 97.7 [name], Pulse: 75 bpm, Respiratory Rate: 18 breaths/min, Blood Pressure: 104/71 mmHg. Eyes conjunctiva clear no eyelid edema noted. pupils equal round and reactive to light and accommodation. Ears, Nose, Mouth, and Throat no gross abnormality of ear auricles or external auditory canals. normal hearing noted during conversation. mucus membranes moist. Respiratory normal breathing without difficulty. Cardiovascular 2+ dorsalis pedis/posterior tibialis pulses. trace pitting edema of the bilateral lower extremities. Musculoskeletal normal gait and posture. no significant deformity or arthritic changes, no loss or range of motion, no clubbing. Psychiatric this patient is able to make decisions and demonstrates good insight into disease process. Alert and Oriented x 3. pleasant and cooperative. [name], [name] ([id number]) [id number] Page 5 of 7 General Notes: Upon inspection patient's legs on the left actually showed an area where she did have an obvious sign that there was a previous ulceration here although this is very well-healed and there is no signs of anything threatening to reopen I actually think that she did a great job taking care of this at home and on her own. With that being said she unfortunately no she has good blood flow and seems to have a good ABI/TBI as well which was noted and documented does seem to have some issues here with chronic swelling probably secondary to her dialysis to some degree. Assessment Active Problems ICD-10 Chronic venous hypertension (idiopathic) with inflammation of bilateral lower extremity Atherosclerotic heart disease of native coronary artery without angina pectoris End stage renal disease Dependence on renal  dialysis Personal history of transient ischemic attack (TIA), and cerebral infarction without residual deficits Plan 1. Based on what I am seeing I do believe that the patient is going to likely need to have some compression socks. I explained to her that compression is something that is going to be an ongoing requirement for her in my opinion. 2. I am good recommend as well the patient should continue to monitor for any signs of infection or worsening. Obviously right now I think she does not have any wounds open other than the compression socks which I think she should be wearing on a regular basis I think everything else looks like is doing quite well obviously if anything changes she can let me know. Will see back for follow-up visit as needed. Electronic Signature(s) Signed: 01/14/2023 4:46:46 PM By: Allen Derry PA-C Entered By: Allen Derry on Vanessa 16:46:46 -------------------------------------------------------------------------------- ROS/PFSH Details Patient Name:  Date of Service: [name], [name] 01/14/2023 2:00 PM Medical Record Number: [id number] Patient Account Number: [id number] Date of Birth/Sex: Treating RN: [name][birth year] (62 y.o. [name] Primary Care Provider: Grayling Congress Other Clinician: Referring Provider: Treating Provider/Extender: Erasmo Leventhal Weeks in Treatment: 0 Information Obtained From Patient Constitutional Symptoms (General Health) Complaints and Symptoms: Negative for: Fatigue; Fever; Chills; Marked Weight Change Medical History: Past Medical History Notes: Triple by-pass surgery 6 weeks ago, End Stage Renal disease; Stoke, Eyes Complaints and Symptoms: Negative for: Dry Eyes; Vision Changes; Glasses / Contacts [name], [name] ([id number]) [id number] Page 6 of 7 Ear/Nose/Mouth/Throat Complaints and Symptoms: Negative for: Difficult clearing ears; Sinusitis Hematologic/Lymphatic Complaints  and Symptoms: Negative for: Bleeding / Clotting Disorders; Human Immunodeficiency Virus Respiratory Complaints and Symptoms: Negative for: Chronic or frequent coughs; Shortness of Breath Cardiovascular Complaints and Symptoms: Negative for: LE edema Medical History: Positive for: Coronary Artery Disease; Hypertension; Hypotension; Myocardial Infarction Endocrine Complaints and Symptoms: Positive for: Thyroid disease Negative for: Hepatitis; Polydypsia (Excessive Thirst) Medical History: Negative for: Type I Diabetes; Type II Diabetes Genitourinary Complaints and Symptoms: Positive for: Kidney failure/ Dialysis - MWF Medical History: Positive for: End Stage Renal Disease Integumentary (Skin) Complaints and Symptoms: Negative for: Wounds; Bleeding or bruising tendency; Breakdown; Swelling Medical History: Negative for: History of Burn; History of pressure wounds Musculoskeletal Complaints and Symptoms: Negative for: Muscle Pain; Muscle Weakness Psychiatric Complaints and Symptoms: Negative for: Anxiety; Claustrophobia Neurologic Medical History: Past Medical History Notes: CVA 05/2010 Oncologic Immunizations Pneumococcal Vaccine: Received Pneumococcal Vaccination: Yes Received Pneumococcal Vaccination On or After 60th Birthday: No Implantable Devices None Family and Social History Current some day smoker; Marital Status - Single; Alcohol Use: Rarely; Drug Use: No History; Caffeine Use: Moderate [name], [name] ([id number]) [id number] Page 7 of 7 Electronic Signature(s) Signed: 01/14/2023 4:48:59 PM By: Allen Derry PA-C Signed: 01/15/2023 1:16:50 PM By: Midge Aver MSN RN CNS WTA Entered By: Midge Aver on Vanessa 14:05:56 -------------------------------------------------------------------------------- SuperBill Details Patient Name: Date of Service: Vanessa Ridges [name] 01/14/2023 Medical Record Number: [id number] Patient Account  Number: [id number] Date of Birth/Sex: Treating RN: [name][birth year] (62 y.o. [name] Primary Care Provider: Grayling Congress Other Clinician: Referring Provider: Treating Provider/Extender: Erasmo Leventhal Weeks in Treatment: 0 Diagnosis Coding ICD-10 Codes Code Description [phone] Chronic venous hypertension (idiopathic) with inflammation of bilateral lower extremity I25.10 Atherosclerotic heart disease of native coronary artery without angina pectoris N18.6 End stage renal disease Z99.2 Dependence on renal dialysis Z86.73 Personal history of transient ischemic attack (TIA), and cerebral infarction without residual deficits Facility Procedures : CPT4 Code: [id number] Description: 99213 - WOUND CARE VISIT-LEV 3 EST PT Modifier: Quantity: 1 Physician Procedures : CPT4 Code Description Modifier [id number] WC PHYS LEVEL 3 Vanessa PT ICD-10 Diagnosis Description I87.323 Chronic venous hypertension (idiopathic) with inflammation of bilateral lower extremity I25.10 Atherosclerotic heart disease of native coronary artery  without angina pectoris N18.6 End stage renal disease Z99.2 Dependence on renal dialysis Quantity: 1 Electronic Signature(s) Signed: 01/14/2023 4:47:21 PM By: Allen Derry PA-C Previous Signature: 01/14/2023 4:47:01 PM Version By: Allen Derry PA-C Entered By: Allen Derry on Vanessa 16:47:21

## 2023-01-16 IMAGING — CT CT ABD-PELV W/ CM
2 of 5 series · 15 of 46 positions shown, 17 images · IV contrast (APPLIED)
Comparison: None.

CLINICAL DATA: Nausea, vomiting, right lower quadrant abdominal
pain

EXAM:
CT ABDOMEN AND PELVIS WITH CONTRAST
TECHNIQUE: Multidetector CT imaging of the abdomen and pelvis was performed
using the standard protocol following bolus administration of
intravenous contrast.
CONTRAST:  75 cc Omnipaque 350

[Series 2: routine abd/pel with · axial · 0.84mm/px · z∈[-1121,-731]mm · 12 of 88 slices shown, 14 images]
[im 5/88  soft-tissue]
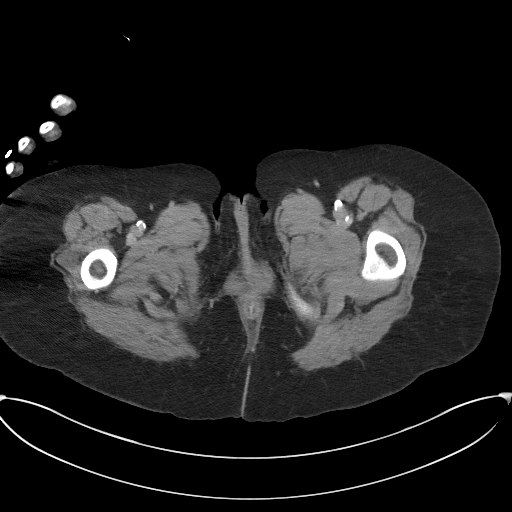
[im 5/88  bone]
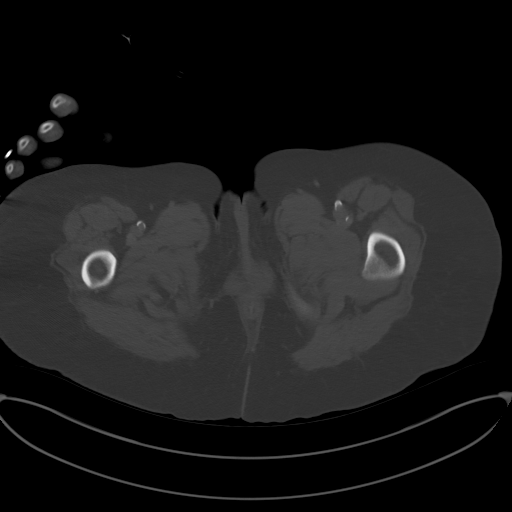
[im 14/88  soft-tissue]
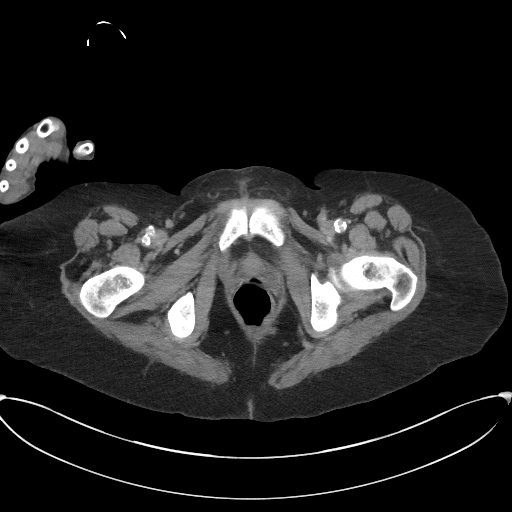
[im 19/88  soft-tissue]
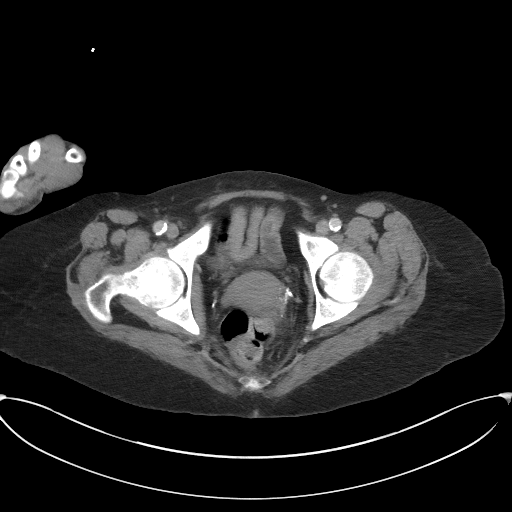
[im 28/88  soft-tissue]
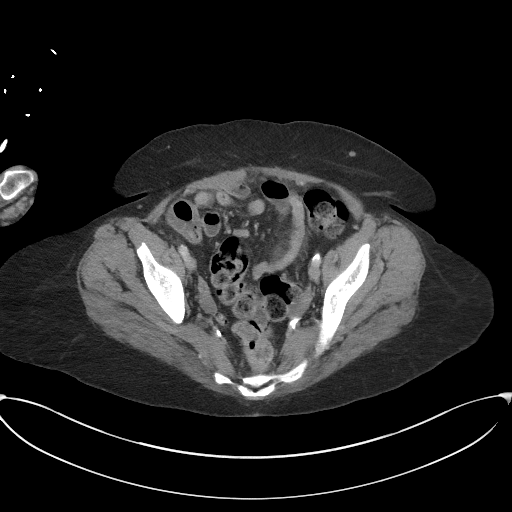
[im 33/88  soft-tissue]
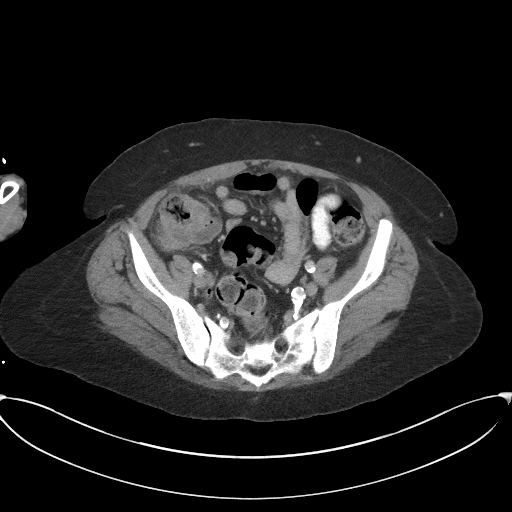
[im 42/88  soft-tissue]
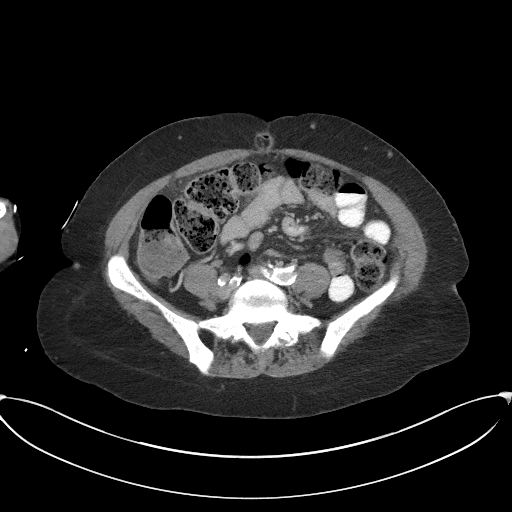
[im 46/88  soft-tissue]
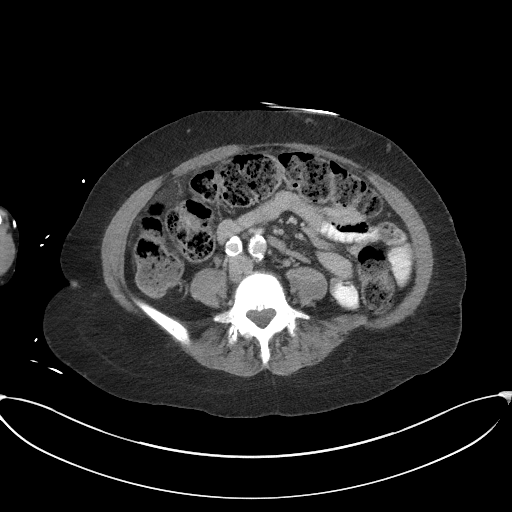
[im 55/88  soft-tissue]
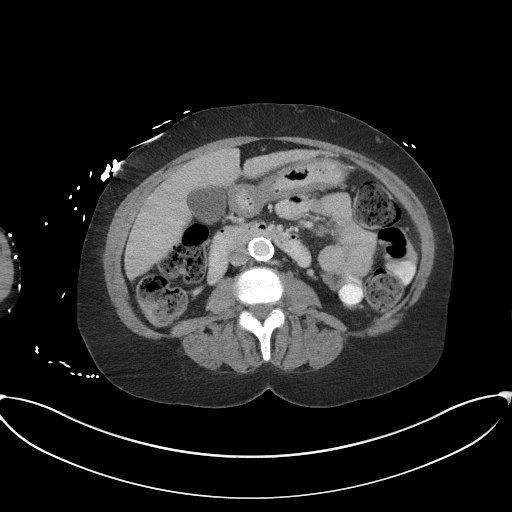
[im 60/88  soft-tissue]
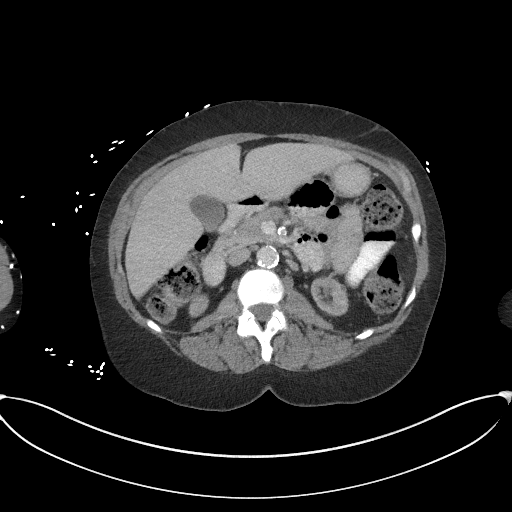
[im 60/88  bone]
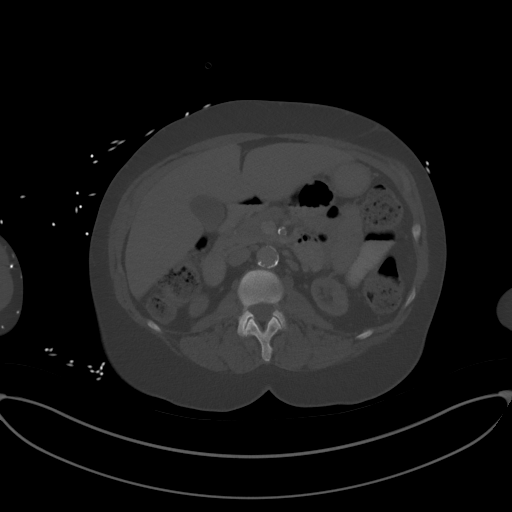
[im 69/88  soft-tissue]
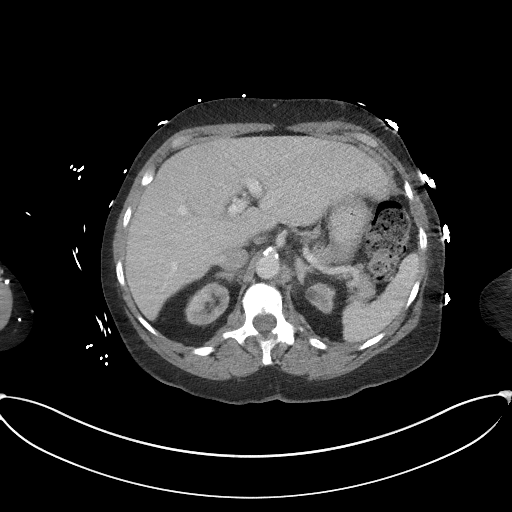
[im 74/88  soft-tissue]
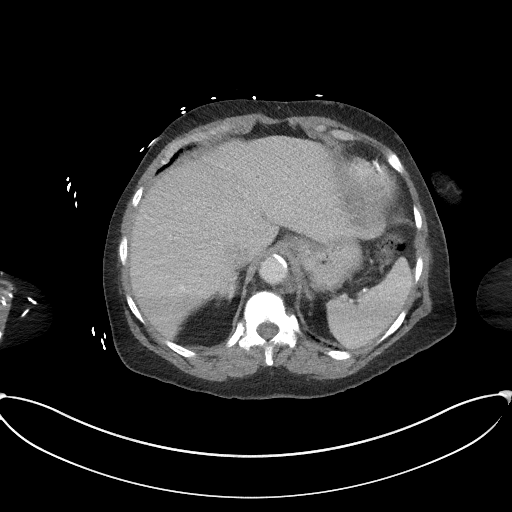
[im 83/88  soft-tissue]
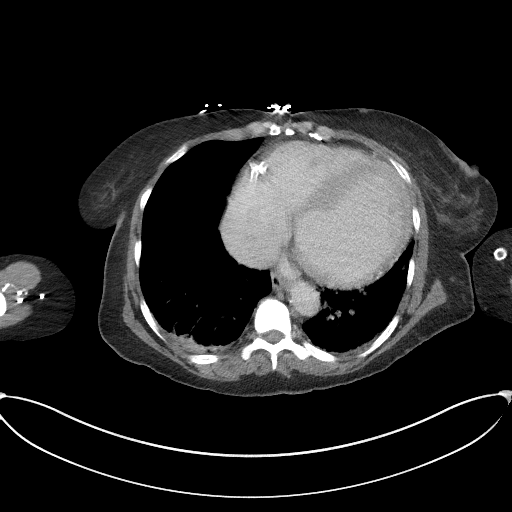

[Series 5: coronal st · coronal · 0.66mm/px · 3 of 89 slices shown]
[im 30/89  soft-tissue]
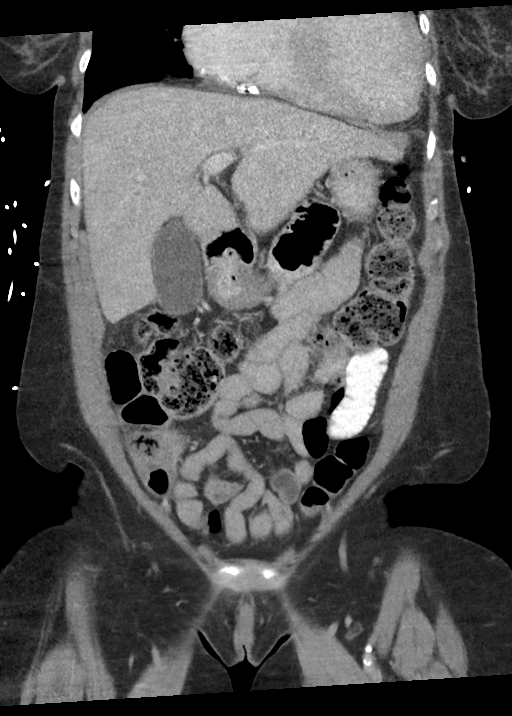
[im 40/89  soft-tissue]
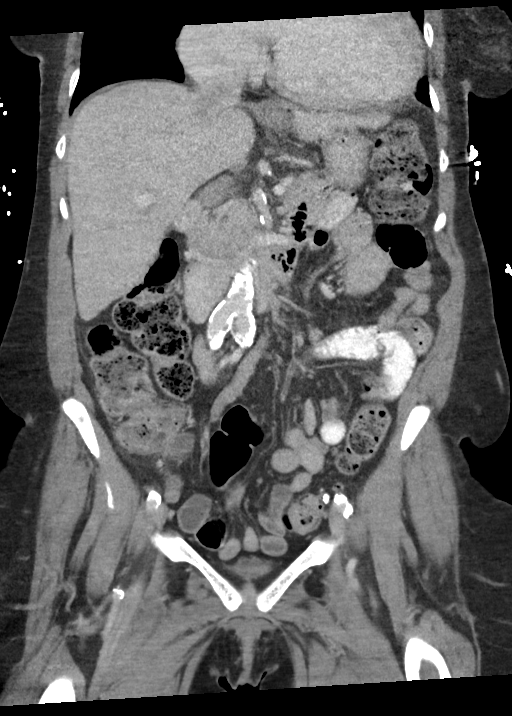
[im 49/89  soft-tissue]
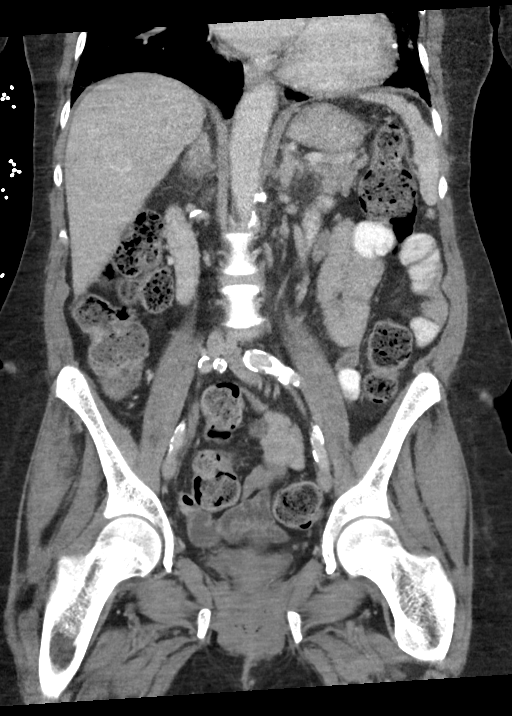

[15 of 46 positions shown; findings below may reference images not displayed]

FINDINGS: Lower chest: Mild bibasilar atelectasis. Moderate cardiomegaly.
Extensive coronary artery calcification

Hepatobiliary: Cholelithiasis without pericholecystic inflammatory
change. No intra or extrahepatic biliary ductal dilation. Liver
unremarkable.

Pancreas: Unremarkable

Spleen: Unremarkable

Adrenals/Urinary Tract: The adrenal glands are unremarkable. Marked
bilateral renal cortical atrophy is present in keeping with changes
of chronic renal failure. The bladder is decompressed and is
unremarkable.

Stomach/Bowel: The stomach and small bowel are unremarkable. There
is mild circumferential bowel wall thickening involving the cecum
with mild pericolonic soft tissue infiltration noted, best seen on
image # 57/2. There is mild infiltration of the adjacent omental
fat. In the acute setting, this may represent a focal infectious or
inflammatory colitis with adjacent inflammatory change.
Alternatively, this may represent a a focal mural mass with
transmural infiltration. The terminal is unremarkable. The adjacent
appendix is well visualized and is normal. There is no evidence of
obstruction or perforation. No free intraperitoneal gas or fluid

Vascular/Lymphatic: There is extensive aortoiliac atherosclerotic
calcification. No aortic aneurysm. No pathologic adenopathy within
the abdomen and pelvis.

Reproductive: Multiple calcified uterine fibroids are again
identified. The pelvic organs are otherwise unremarkable.

Other: Tiny fat containing umbilical hernia.  Rectum unremarkable.

Musculoskeletal: No acute bone abnormality. No lytic or blastic bone
lesions. Diffuse sclerosis of the osseous structures is in keeping
with renal osteodystrophy.
IMPRESSION: Mild focal cecal bowel wall thickening and pericolonic soft tissue
infiltration. In the acute setting, this may represent a focal
infectious or inflammatory colitis in adjacent inflammatory change.
However, an infiltrative mural mass could appear similarly with
pericolonic changes representing transmural extension of disease.
Correlation with recent screening endoscopy is recommended. No
evidence of obstruction or focal inflammation.

Cholelithiasis.

Changes in keeping with chronic renal failure. In this setting,
diffuse osseous sclerosis may reflect changes of renal
osteodystrophy.

Extensive coronary artery calcification.  Moderate cardiomegaly.

Aortic Atherosclerosis ([id number]).

## 2023-01-16 IMAGING — DX DG CHEST 1V PORT
1 series · 1 of 1 positions shown · non-contrast
Comparison: Chest radiograph dated 09/30/2020.

CLINICAL DATA: Chest pain.

EXAM:
PORTABLE CHEST 1 VIEW

[chest ap]
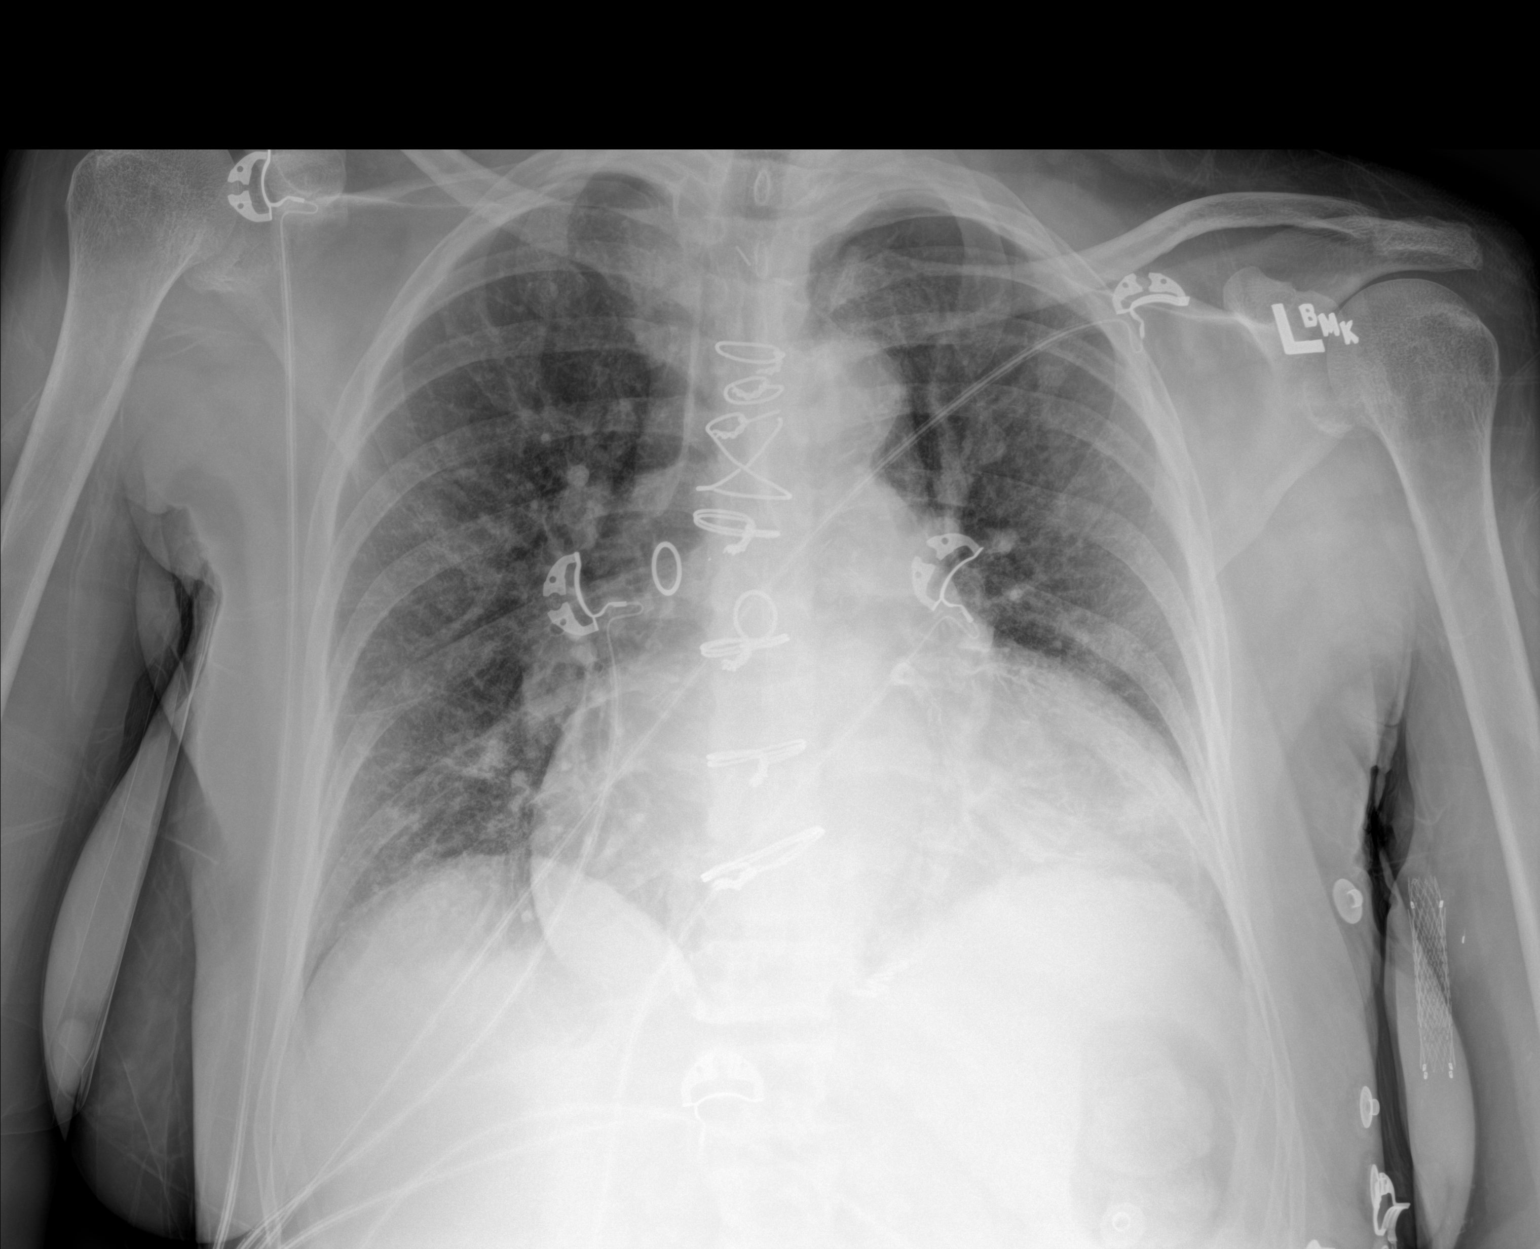

[1 of 1 positions shown; findings below may reference images not displayed]

FINDINGS: Cardiomegaly with mild vascular congestion. No focal consolidation,
pleural effusion, or pneumothorax. Median sternotomy wires and
coronary stent. No acute osseous pathology. Vascular stent in the
left upper extremity.
IMPRESSION: Cardiomegaly with mild vascular congestion. No focal consolidation.

## 2023-01-18 DIAGNOSIS — N2581 Secondary hyperparathyroidism of renal origin: Secondary | ICD-10-CM | POA: Diagnosis not present

## 2023-01-18 DIAGNOSIS — N186 End stage renal disease: Secondary | ICD-10-CM | POA: Diagnosis not present

## 2023-01-18 DIAGNOSIS — Z992 Dependence on renal dialysis: Secondary | ICD-10-CM | POA: Diagnosis not present

## 2023-01-20 DIAGNOSIS — Z992 Dependence on renal dialysis: Secondary | ICD-10-CM | POA: Diagnosis not present

## 2023-01-20 DIAGNOSIS — N186 End stage renal disease: Secondary | ICD-10-CM | POA: Diagnosis not present

## 2023-01-20 DIAGNOSIS — N2581 Secondary hyperparathyroidism of renal origin: Secondary | ICD-10-CM | POA: Diagnosis not present

## 2023-01-22 DIAGNOSIS — N2581 Secondary hyperparathyroidism of renal origin: Secondary | ICD-10-CM | POA: Diagnosis not present

## 2023-01-22 DIAGNOSIS — N186 End stage renal disease: Secondary | ICD-10-CM | POA: Diagnosis not present

## 2023-01-22 DIAGNOSIS — Z992 Dependence on renal dialysis: Secondary | ICD-10-CM | POA: Diagnosis not present

## 2023-01-24 DIAGNOSIS — I509 Heart failure, unspecified: Secondary | ICD-10-CM | POA: Diagnosis not present

## 2023-01-25 ENCOUNTER — Other Ambulatory Visit: Payer: Self-pay | Admitting: Internal Medicine

## 2023-01-25 DIAGNOSIS — R188 Other ascites: Secondary | ICD-10-CM

## 2023-01-25 DIAGNOSIS — N186 End stage renal disease: Secondary | ICD-10-CM | POA: Diagnosis not present

## 2023-01-25 DIAGNOSIS — Z992 Dependence on renal dialysis: Secondary | ICD-10-CM | POA: Diagnosis not present

## 2023-01-25 DIAGNOSIS — N2581 Secondary hyperparathyroidism of renal origin: Secondary | ICD-10-CM | POA: Diagnosis not present

## 2023-01-27 DIAGNOSIS — N186 End stage renal disease: Secondary | ICD-10-CM | POA: Diagnosis not present

## 2023-01-27 DIAGNOSIS — N2581 Secondary hyperparathyroidism of renal origin: Secondary | ICD-10-CM | POA: Diagnosis not present

## 2023-01-27 DIAGNOSIS — Z992 Dependence on renal dialysis: Secondary | ICD-10-CM | POA: Diagnosis not present

## 2023-01-29 ENCOUNTER — Ambulatory Visit
Admission: RE | Admit: 2023-01-29 | Discharge: 2023-01-29 | Disposition: A | Discharge status: Home / Self Care | Payer: 59 | Source: Ambulatory Visit | Attending: Internal Medicine | Admitting: Internal Medicine

## 2023-01-29 DIAGNOSIS — R188 Other ascites: Secondary | ICD-10-CM

## 2023-01-29 DIAGNOSIS — Z992 Dependence on renal dialysis: Secondary | ICD-10-CM | POA: Diagnosis not present

## 2023-01-29 DIAGNOSIS — N2581 Secondary hyperparathyroidism of renal origin: Secondary | ICD-10-CM | POA: Diagnosis not present

## 2023-01-29 DIAGNOSIS — N186 End stage renal disease: Secondary | ICD-10-CM | POA: Diagnosis not present

## 2023-01-29 MED ORDER — LIDOCAINE HCL (PF) 1 % IJ SOLN
10.0000 mL | Freq: Once | INTRAMUSCULAR | Status: AC
  Administered 2023-01-29: 10 mL via SUBCUTANEOUS
  Filled 2023-01-29: qty 10

## 2023-01-29 NOTE — Procedures (Signed)
PROCEDURE SUMMARY:  Successful US guided paracentesis from right lateral abdomen.  Yielded 2.6 liters of cloudy, yellow fluid.  No immediate complications.  Pt tolerated well.   Specimen was not sent for labs.  EBL < 5mL  Hoyt Koch PA-C 01/29/2023 11:24 AM

## 2023-02-01 ENCOUNTER — Telehealth (INDEPENDENT_AMBULATORY_CARE_PROVIDER_SITE_OTHER): Payer: Self-pay

## 2023-02-01 DIAGNOSIS — N186 End stage renal disease: Secondary | ICD-10-CM | POA: Diagnosis not present

## 2023-02-01 DIAGNOSIS — N2581 Secondary hyperparathyroidism of renal origin: Secondary | ICD-10-CM | POA: Diagnosis not present

## 2023-02-01 DIAGNOSIS — Z992 Dependence on renal dialysis: Secondary | ICD-10-CM | POA: Diagnosis not present

## 2023-02-01 NOTE — Telephone Encounter (Signed)
Spoke with the patient and she is scheduled with Dr. Wyn Quaker on 02/11/23 with a 11:00 am arrival time to the [facility] for a left arm fistulagram. Pre-procedure instructions were discussed and will be mailed.

## 2023-02-03 DIAGNOSIS — Z992 Dependence on renal dialysis: Secondary | ICD-10-CM | POA: Diagnosis not present

## 2023-02-03 DIAGNOSIS — N186 End stage renal disease: Secondary | ICD-10-CM | POA: Diagnosis not present

## 2023-02-03 DIAGNOSIS — N2581 Secondary hyperparathyroidism of renal origin: Secondary | ICD-10-CM | POA: Diagnosis not present

## 2023-02-05 DIAGNOSIS — Z992 Dependence on renal dialysis: Secondary | ICD-10-CM | POA: Diagnosis not present

## 2023-02-05 DIAGNOSIS — N186 End stage renal disease: Secondary | ICD-10-CM | POA: Diagnosis not present

## 2023-02-05 DIAGNOSIS — N2581 Secondary hyperparathyroidism of renal origin: Secondary | ICD-10-CM | POA: Diagnosis not present

## 2023-02-08 ENCOUNTER — Other Ambulatory Visit: Payer: Self-pay | Admitting: Internal Medicine

## 2023-02-08 DIAGNOSIS — Z992 Dependence on renal dialysis: Secondary | ICD-10-CM | POA: Diagnosis not present

## 2023-02-08 DIAGNOSIS — N2581 Secondary hyperparathyroidism of renal origin: Secondary | ICD-10-CM | POA: Diagnosis not present

## 2023-02-08 DIAGNOSIS — R188 Other ascites: Secondary | ICD-10-CM

## 2023-02-08 DIAGNOSIS — N186 End stage renal disease: Secondary | ICD-10-CM | POA: Diagnosis not present

## 2023-02-10 ENCOUNTER — Ambulatory Visit
Admission: RE | Admit: 2023-02-10 | Discharge: 2023-02-10 | Disposition: A | Discharge status: Home / Self Care | Payer: 59 | Source: Ambulatory Visit | Attending: Internal Medicine | Admitting: Internal Medicine

## 2023-02-10 DIAGNOSIS — Z992 Dependence on renal dialysis: Secondary | ICD-10-CM | POA: Diagnosis not present

## 2023-02-10 DIAGNOSIS — N2581 Secondary hyperparathyroidism of renal origin: Secondary | ICD-10-CM | POA: Diagnosis not present

## 2023-02-10 DIAGNOSIS — R188 Other ascites: Secondary | ICD-10-CM | POA: Diagnosis not present

## 2023-02-10 DIAGNOSIS — N186 End stage renal disease: Secondary | ICD-10-CM | POA: Diagnosis not present

## 2023-02-10 MED ORDER — LIDOCAINE HCL (PF) 1 % IJ SOLN
15.0000 mL | Freq: Once | INTRAMUSCULAR | Status: AC
  Administered 2023-02-10: 15 mL via INTRADERMAL
  Filled 2023-02-10: qty 15

## 2023-02-10 NOTE — Procedures (Signed)
PROCEDURE SUMMARY:  Successful US guided paracentesis from right abdomen.  Yielded 1.8 L of clear yellow fluid.  No immediate complications.  Pt tolerated well.   Specimen not sent for labs.  EBL < 2 mL  Mickie Kay, NP 02/10/2023 1:33 PM

## 2023-02-11 ENCOUNTER — Other Ambulatory Visit: Payer: Self-pay

## 2023-02-11 ENCOUNTER — Ambulatory Visit: Payer: 59 | Admitting: General Practice

## 2023-02-11 ENCOUNTER — Ambulatory Visit
Admission: RE | Admit: 2023-02-11 | Discharge: 2023-02-11 | Disposition: A | Discharge status: Home / Self Care | Payer: 59 | Attending: Vascular Surgery | Admitting: Vascular Surgery

## 2023-02-11 ENCOUNTER — Encounter: Payer: Self-pay | Admitting: Vascular Surgery

## 2023-02-11 ENCOUNTER — Encounter
Admission: RE | Disposition: A | Discharge status: Home / Self Care | Payer: Self-pay | Source: Home / Self Care | Attending: Vascular Surgery

## 2023-02-11 DIAGNOSIS — I132 Hypertensive heart and chronic kidney disease with heart failure and with stage 5 chronic kidney disease, or end stage renal disease: Secondary | ICD-10-CM | POA: Diagnosis not present

## 2023-02-11 DIAGNOSIS — Z833 Family history of diabetes mellitus: Secondary | ICD-10-CM | POA: Insufficient documentation

## 2023-02-11 DIAGNOSIS — E785 Hyperlipidemia, unspecified: Secondary | ICD-10-CM | POA: Diagnosis not present

## 2023-02-11 DIAGNOSIS — Y832 Surgical operation with anastomosis, bypass or graft as the cause of abnormal reaction of the patient, or of later complication, without mention of misadventure at the time of the procedure: Secondary | ICD-10-CM | POA: Insufficient documentation

## 2023-02-11 DIAGNOSIS — Z9889 Other specified postprocedural states: Secondary | ICD-10-CM | POA: Diagnosis not present

## 2023-02-11 DIAGNOSIS — Z992 Dependence on renal dialysis: Secondary | ICD-10-CM | POA: Diagnosis not present

## 2023-02-11 DIAGNOSIS — I5042 Chronic combined systolic (congestive) and diastolic (congestive) heart failure: Secondary | ICD-10-CM | POA: Diagnosis not present

## 2023-02-11 DIAGNOSIS — F17210 Nicotine dependence, cigarettes, uncomplicated: Secondary | ICD-10-CM | POA: Insufficient documentation

## 2023-02-11 DIAGNOSIS — T82858A Stenosis of vascular prosthetic devices, implants and grafts, initial encounter: Secondary | ICD-10-CM | POA: Insufficient documentation

## 2023-02-11 DIAGNOSIS — Z8249 Family history of ischemic heart disease and other diseases of the circulatory system: Secondary | ICD-10-CM | POA: Insufficient documentation

## 2023-02-11 DIAGNOSIS — N186 End stage renal disease: Secondary | ICD-10-CM | POA: Insufficient documentation

## 2023-02-11 DIAGNOSIS — I509 Heart failure, unspecified: Secondary | ICD-10-CM | POA: Insufficient documentation

## 2023-02-11 DIAGNOSIS — I480 Paroxysmal atrial fibrillation: Secondary | ICD-10-CM | POA: Diagnosis not present

## 2023-02-11 DIAGNOSIS — D631 Anemia in chronic kidney disease: Secondary | ICD-10-CM | POA: Diagnosis not present

## 2023-02-11 HISTORY — PX: A/V FISTULAGRAM: CATH118298

## 2023-02-11 LAB — POTASSIUM (ARMC VASCULAR LAB ONLY): Potassium (ARMC vascular lab): 4.4 mmol/L (ref 3.5–5.1)

## 2023-02-11 SURGERY — A/V FISTULAGRAM
Anesthesia: General | Laterality: Left

## 2023-02-11 MED ORDER — CEFAZOLIN SODIUM-DEXTROSE 1-4 GM/50ML-% IV SOLN
INTRAVENOUS | Status: AC
  Filled 2023-02-11: qty 50

## 2023-02-11 MED ORDER — KETAMINE HCL 50 MG/5ML IJ SOSY
PREFILLED_SYRINGE | INTRAMUSCULAR | Status: AC
  Filled 2023-02-11: qty 5

## 2023-02-11 MED ORDER — KETAMINE HCL 10 MG/ML IJ SOLN
INTRAMUSCULAR | Status: DC | PRN
Start: 2023-02-11 — End: 2023-02-11
  Administered 2023-02-11 (×2): 10 mg via INTRAVENOUS

## 2023-02-11 MED ORDER — ONDANSETRON HCL 4 MG/2ML IJ SOLN: 4.0000 mg | Freq: Four times a day (QID) | INTRAMUSCULAR | Status: DC | PRN

## 2023-02-11 MED ORDER — HEPARIN SODIUM (PORCINE) 1000 UNIT/ML IJ SOLN
INTRAMUSCULAR | Status: DC | PRN
Start: 2023-02-11 — End: 2023-02-11
  Administered 2023-02-11: 3000 [IU] via INTRAVENOUS

## 2023-02-11 MED ORDER — FAMOTIDINE 20 MG PO TABS: 40.0000 mg | ORAL_TABLET | Freq: Once | ORAL | Status: DC | PRN

## 2023-02-11 MED ORDER — LIDOCAINE-EPINEPHRINE (PF) 1 %-1:200000 IJ SOLN
INTRAMUSCULAR | Status: DC | PRN
  Administered 2023-02-11: 10 mL

## 2023-02-11 MED ORDER — PROPOFOL 10 MG/ML IV BOLUS: INTRAVENOUS | Status: DC | PRN

## 2023-02-11 MED ORDER — HEPARIN (PORCINE) IN NACL 1000-0.9 UT/500ML-% IV SOLN
INTRAVENOUS | Status: DC | PRN
  Administered 2023-02-11: 1000 mL

## 2023-02-11 MED ORDER — MIDAZOLAM HCL 2 MG/2ML IJ SOLN
INTRAMUSCULAR | Status: AC
  Filled 2023-02-11: qty 2

## 2023-02-11 MED ORDER — PROPOFOL 10 MG/ML IV BOLUS
INTRAVENOUS | Status: DC | PRN
  Administered 2023-02-11: 20 mg via INTRAVENOUS
  Administered 2023-02-11: 30 mg via INTRAVENOUS

## 2023-02-11 MED ORDER — MIDAZOLAM HCL 2 MG/ML PO SYRP
ORAL_SOLUTION | ORAL | Status: AC
  Filled 2023-02-11: qty 5

## 2023-02-11 MED ORDER — EPHEDRINE SULFATE (PRESSORS) 50 MG/ML IJ SOLN
INTRAMUSCULAR | Status: DC | PRN
  Administered 2023-02-11 (×2): 5 mg via INTRAVENOUS

## 2023-02-11 MED ORDER — DIPHENHYDRAMINE HCL 50 MG/ML IJ SOLN: 50.0000 mg | Freq: Once | INTRAMUSCULAR | Status: DC | PRN

## 2023-02-11 MED ORDER — PROPOFOL 500 MG/50ML IV EMUL
INTRAVENOUS | Status: DC | PRN
  Administered 2023-02-11: 125 ug/kg/min via INTRAVENOUS

## 2023-02-11 MED ORDER — METHYLPREDNISOLONE SODIUM SUCC 125 MG IJ SOLR: 125.0000 mg | Freq: Once | INTRAMUSCULAR | Status: DC | PRN

## 2023-02-11 MED ORDER — IODIXANOL 320 MG/ML IV SOLN
INTRAVENOUS | Status: DC | PRN
  Administered 2023-02-11: 15 mL via INTRAVENOUS

## 2023-02-11 MED ORDER — HYDROMORPHONE HCL 1 MG/ML IJ SOLN: 1.0000 mg | Freq: Once | INTRAMUSCULAR | Status: DC | PRN

## 2023-02-11 MED ORDER — VASOPRESSIN 20 UNIT/ML IV SOLN
INTRAVENOUS | Status: DC | PRN
  Administered 2023-02-11 (×2): 1 [IU] via INTRAVENOUS

## 2023-02-11 MED ORDER — CEFAZOLIN SODIUM-DEXTROSE 1-4 GM/50ML-% IV SOLN
1.0000 g | INTRAVENOUS | Status: AC
  Administered 2023-02-11: 2 g via INTRAVENOUS

## 2023-02-11 MED ORDER — PHENYLEPHRINE HCL (PRESSORS) 10 MG/ML IV SOLN
INTRAVENOUS | Status: DC | PRN
  Administered 2023-02-11: 160 ug via INTRAVENOUS
  Administered 2023-02-11 (×2): 80 ug via INTRAVENOUS

## 2023-02-11 MED ORDER — MIDAZOLAM HCL 2 MG/ML PO SYRP
8.0000 mg | ORAL_SOLUTION | Freq: Once | ORAL | Status: AC | PRN
  Administered 2023-02-11: 8 mg via ORAL

## 2023-02-11 MED ORDER — SODIUM CHLORIDE 0.9 % IV SOLN: INTRAVENOUS | Status: DC

## 2023-02-11 SURGICAL SUPPLY — 9 items
BALLN LUTONIX AV 9X60X75 (BALLOONS) ×1
BALLOON LUTONIX AV 9X60X75 (BALLOONS) IMPLANT
GUIDEWIRE SUPER STIFF .035X180 (WIRE) IMPLANT
KIT ENCORE 26 ADVANTAGE (KITS) IMPLANT
KIT MICROPUNCTURE NIT STIFF (SHEATH) IMPLANT
PACK ANGIOGRAPHY (CUSTOM PROCEDURE TRAY) ×1 IMPLANT
SHEATH BRITE TIP 6FRX5.5 (SHEATH) IMPLANT
SUT MNCRL AB 4-0 PS2 18 (SUTURE) IMPLANT
WIRE SUPRACORE 190CM (WIRE) IMPLANT

## 2023-02-11 NOTE — Op Note (Signed)
[facility]    OPERATIVE NOTE   PROCEDURE: 1.   Left brachiobasilic arteriovenous fistula cannulation under ultrasound guidance 2.   Left arm fistulagram including central venogram 3.   Percutaneous transluminal angioplasty of the mid upper arm access site stenosis with 9 mm diameter by 6 cm length Lutonix drug-coated angioplasty balloon  PRE-OPERATIVE DIAGNOSIS: 1. ESRD 2. Poorly functional left brachiobasilic AVF with previous jump graft  POST-OPERATIVE DIAGNOSIS: same as above   SURGEON: Festus Barren, MD  ANESTHESIA: local with MCS  ESTIMATED BLOOD LOSS: 3 cc  FINDING(S): 80 to 90% stenosis in the mid upper arm access portion of what was likely the Artegraft jump graft in the left brachiobasilic AV fistula.  Mild narrowing in the perianastomotic portion of the fistula.  The axillary vein, subclavian vein, and remainder of the central venous circulation was widely patent  SPECIMEN(S):  None  CONTRAST: 15 cc  FLUORO TIME: 1 minute  ANESTHESIA: MAC  INDICATIONS: [name] is a 62 y.o. female who presents with malfunctioning left brachiobasilic arteriovenous fistula.  This is already had a jump graft revision with an Artegraft several years ago.  The patient is scheduled for left arm fistulagram.  The patient is aware the risks include but are not limited to: bleeding, infection, thrombosis of the cannulated access, and possible anaphylactic reaction to the contrast.  The patient is aware of the risks of the procedure and elects to proceed forward.  DESCRIPTION: After full informed written consent was obtained, the patient was brought back to the angiography suite and placed supine upon the angiography table.  The patient was connected to monitoring equipment. Moderate conscious sedation was administered with a face to face encounter with the patient throughout the procedure with my supervision of the RN administering medicines and monitoring the  patient's vital signs and mental status throughout from the start of the procedure until the patient was taken to the recovery room. The left arm was prepped and draped in the standard fashion for a percutaneous access intervention.  Under ultrasound guidance, the left brachiobasilic arteriovenous fistula was cannulated with a micropuncture needle under direct ultrasound guidance where it was patent and a permanent image was performed.  The microwire was advanced into the fistula and the needle was exchanged for the a microsheath.  I then upsized to a 6 Fr Sheath and imaging was performed.  Hand injections were completed to image the access including the central venous system. This demonstrated 80 to 90% stenosis in the mid upper arm access portion of what was likely the Artegraft jump graft in the left brachiobasilic AV fistula.  Mild narrowing in the perianastomotic portion of the fistula.  The axillary vein, subclavian vein, and remainder of the central venous circulation was widely patent.  Based on the images, this patient will need intervention to the access site stenosis. I then gave the patient 3000 units of intravenous heparin.  I then crossed the stenosis with a supracore wire.  Based on the imaging, a 9 mm x 6 cm Lutonix drug coated angioplasty balloon was selected.  The balloon was centered around the mid arm access stenosis and inflated to 10 ATM for 1 minute(s).  On completion imaging, a 10% residual stenosis was present.     Based on the completion imaging, no further intervention is necessary.  The wire and balloon were removed from the sheath.  A 4-0 Monocryl purse-string suture was sewn around the sheath.  The sheath was removed while  tying down the suture.  A sterile bandage was applied to the puncture site.  COMPLICATIONS: None  CONDITION: Stable   Festus Barren  02/11/2023 1:30 PM   This note was created with Dragon Medical transcription system. Any errors in dictation are purely  unintentional.

## 2023-02-11 NOTE — Anesthesia Postprocedure Evaluation (Signed)
Anesthesia Post Note  Patient: [name]  Procedure(s) Performed: A/V Fistulagram (Left)  Patient location during evaluation: PACU Anesthesia Type: General Level of consciousness: awake and alert Pain management: pain level controlled Vital Signs Assessment: post-procedure vital signs reviewed and stable Respiratory status: spontaneous breathing, nonlabored ventilation, respiratory function stable and patient connected to nasal cannula oxygen Cardiovascular status: blood pressure returned to baseline and stable Postop Assessment: no apparent nausea or vomiting Anesthetic complications: no   No notable events documented.   Last Vitals:  Vitals:   02/11/23 1445 02/11/23 1450  BP: 125/81   Pulse:  76  Resp: 20 (!) 22  Temp:    SpO2:  91%    Last Pain:  Vitals:   02/11/23 1431  PainSc: 0-No pain                 Cleda Mccreedy Jomes Giraldo

## 2023-02-11 NOTE — Anesthesia Preprocedure Evaluation (Signed)
Anesthesia Evaluation  Patient identified by MRN, date of birth, ID band Patient awake    Reviewed: Allergy & Precautions, NPO status , Patient's Chart, lab work & pertinent test results  History of Anesthesia Complications Negative for: history of anesthetic complications  Airway Mallampati: III  TM Distance: <3 FB Neck ROM: full    Dental  (+) Chipped, Poor Dentition, Missing   Pulmonary neg shortness of breath, Current Smoker and Patient abstained from smoking.   Pulmonary exam normal        Cardiovascular Exercise Tolerance: Good hypertension, + angina  + CAD, + Past MI, + Peripheral Vascular Disease and +CHF  Normal cardiovascular exam     Neuro/Psych CVA, Residual Symptoms  negative psych ROS   GI/Hepatic ,GERD  Controlled,,(+) Hepatitis -  Endo/Other  Hypothyroidism    Renal/GU DialysisRenal disease  negative genitourinary   Musculoskeletal   Abdominal   Peds  Hematology negative hematology ROS (+)   Anesthesia Other Findings Past Medical History: No date: Adult behavior problems     Comment:  frontal lobe CVA No date: Anemia     Comment:  chronic disease 03/22/2014: Atrial flutter by electrocardiogram (HCC) No date: Cardiomyopathy (HCC) No date: CHF (congestive heart failure) (HCC) No date: Chicken pox 12/19/2021: Chronic respiratory failure with hypoxia (HCC) No date: Coronary artery disease 04/12/2014: Delayed surgical wound healing     Comment:  Overview:  Left leg No date: GERD (gastroesophageal reflux disease) 04/12/2014: H/O atrial flutter 02/13/2014: H/O stroke without residual deficits 02/27/2014: H/O: substance abuse (HCC) No date: Heart failure (HCC) No date: Hepatitis     Comment:  history of hep c 02/21/2014: History of CVA (cerebrovascular accident) 04/12/2014: History of delirium 11/13/2014: Hyperkalemia No date: Hyperlipidemia No date: Hypertension 02/24/2014:  Hypotension 02/26/2014: Leukocytosis No date: Myocardial infarction [facility]) No date: Obesity No date: Paroxysmal atrial fibrillation (HCC) No date: Peripheral vascular disease (HCC) 02/27/2014: Postoperative anemia due to acute blood loss No date: Renal failure No date: Renal insufficiency 2011: Stroke (HCC) 03/14/2014: Superficial incisional surgical site infection 07/25/2020: Syncope and collapse  Past Surgical History: 03/30/2019: A/V FISTULAGRAM; Left     Comment:  Procedure: A/V FISTULAGRAM;  Surgeon: Annice Needy, MD;               Location: [facility];  Service: Cardiovascular;              Laterality: Left; 10/06/2019: A/V FISTULAGRAM; Left     Comment:  Procedure: A/V FISTULAGRAM;  Surgeon: Annice Needy, MD;               Location: [facility];  Service: Cardiovascular;              Laterality: Left; 05/19/2021: A/V FISTULAGRAM; Left     Comment:  Procedure: A/V FISTULAGRAM;  Surgeon: Annice Needy, MD;               Location: [facility];  Service: Cardiovascular;              Laterality: Left; 01/29/2022: A/V FISTULAGRAM; Left     Comment:  Procedure: A/V Fistulagram;  Surgeon: Annice Needy, MD;               Location: [facility];  Service: Cardiovascular;              Laterality: Left; 04/30/2022: A/V FISTULAGRAM; Left     Comment:  Procedure: A/V Fistulagram;  Surgeon: Annice Needy,  MD;               Location: [facility];  Service: Cardiovascular;              Laterality: Left; 08/20/2022: A/V FISTULAGRAM; Left     Comment:  Procedure: A/V Fistulagram;  Surgeon: Annice Needy, MD;               Location: [facility];  Service: Cardiovascular;              Laterality: Left; 12/03/2022: A/V FISTULAGRAM; N/A     Comment:  Procedure: A/V Fistulagram;  Surgeon: Annice Needy, MD;               Location: [facility];  Service: Cardiovascular;              Laterality: N/A; No date: COLONOSCOPY 04/22/2021:  COLONOSCOPY WITH PROPOFOL; N/A     Comment:  Procedure: COLONOSCOPY WITH PROPOFOL;  Surgeon:               Regis Bill, MD;  Location: [facility];                Service: Endoscopy;  Laterality: N/A; 2013: CORONARY ANGIOPLASTY WITH STENT PLACEMENT No date: CORONARY ARTERY BYPASS GRAFT No date: DIALYSIS FISTULA CREATION No date: ESOPHAGOGASTRODUODENOSCOPY 04/22/2021: ESOPHAGOGASTRODUODENOSCOPY; N/A     Comment:  Procedure: ESOPHAGOGASTRODUODENOSCOPY (EGD);  Surgeon:               Regis Bill, MD;  Location: [facility];                Service: Endoscopy;  Laterality: N/A; 02/23/2019: FLEXIBLE SIGMOIDOSCOPY; N/A     Comment:  Procedure: FLEXIBLE SIGMOIDOSCOPY;  Surgeon: Christena Deem, MD;  Location: [facility] ENDOSCOPY;  Service:               Endoscopy;  Laterality: N/A; 08/08/2018: LEFT HEART CATH AND CORS/GRAFTS ANGIOGRAPHY; N/A     Comment:  Procedure: LEFT HEART CATH AND CORS/GRAFTS ANGIOGRAPHY;               Surgeon: Laurier Nancy, MD;  Location: [facility]              [facility];  Service: Cardiovascular;  Laterality: N/A; 01/30/2019: LEFT HEART CATH AND CORS/GRAFTS ANGIOGRAPHY; N/A     Comment:  Procedure: LEFT HEART CATH AND CORS/GRAFTS ANGIOGRAPHY;               Surgeon: Laurier Nancy, MD;  Location: [facility]              [facility];  Service: Cardiovascular;  Laterality: N/A; No date: percutaneous insertion intra aortic balloon right cath No date: ultrasound guided pericardiocentesis  BMI    Body Mass Index: 23.40 kg/m      Reproductive/Obstetrics negative OB ROS                             Anesthesia Physical Anesthesia Plan  ASA: 4  Anesthesia Plan: General   Post-op Pain Management:    Induction: Intravenous  PONV Risk Score and Plan: Propofol infusion and TIVA  Airway Management Planned: Natural Airway and Nasal Cannula  Additional Equipment:   Intra-op Plan:   Post-operative Plan:    Informed Consent: I have reviewed the patients History and  Physical, chart, labs and discussed the procedure including the risks, benefits and alternatives for the proposed anesthesia with the patient or authorized representative who has indicated his/her understanding and acceptance.     Dental Advisory Given  Plan Discussed with: Anesthesiologist, CRNA and Surgeon  Anesthesia Plan Comments: (Patient consented for risks of anesthesia including but not limited to:  - adverse reactions to medications - risk of airway placement if required - damage to eyes, teeth, lips or other oral mucosa - nerve damage due to positioning  - sore throat or hoarseness - Damage to heart, brain, nerves, lungs, other parts of body or loss of life  Patient voiced understanding.)       Anesthesia Quick Evaluation

## 2023-02-11 NOTE — Transfer of Care (Signed)
Immediate Anesthesia Transfer of Care Note  Patient: [name]  Procedure(s) Performed: A/V Fistulagram (Left)  Patient Location: PACU  Anesthesia Type:MAC  Level of Consciousness: drowsy and patient cooperative  Airway & Oxygen Therapy: Patient Spontanous Breathing and Patient connected to face mask oxygen  Post-op Assessment: Report given to RN and Post -op Vital signs reviewed and stable  Post vital signs: Reviewed and stable  Last Vitals:  Vitals Value Taken Time  BP 116/68 02/11/23 1400  Temp    Pulse 51 02/11/23 1402  Resp 21 02/11/23 1402  SpO2 97 % 02/11/23 1402  Vitals shown include unfiled device data.  Last Pain:  Vitals:   02/11/23 1339  PainSc: Asleep         Complications: No notable events documented.

## 2023-02-11 NOTE — Interval H&P Note (Signed)
History and Physical Interval Note:  02/11/2023 12:22 PM  [name]  has presented today for surgery, with the diagnosis of L arm fistulagram  ANESTHESIA   End Stage Renal.  The various methods of treatment have been discussed with the patient and family. After consideration of risks, benefits and other options for treatment, the patient has consented to  Procedure(s): A/V Fistulagram (Left) as a surgical intervention.  The patient's history has been reviewed, patient examined, no change in status, stable for surgery.  I have reviewed the patient's chart and labs.  Questions were answered to the patient's satisfaction.     Festus Barren

## 2023-02-12 ENCOUNTER — Encounter: Payer: Self-pay | Admitting: Vascular Surgery

## 2023-02-12 DIAGNOSIS — N2581 Secondary hyperparathyroidism of renal origin: Secondary | ICD-10-CM | POA: Diagnosis not present

## 2023-02-12 DIAGNOSIS — Z992 Dependence on renal dialysis: Secondary | ICD-10-CM | POA: Diagnosis not present

## 2023-02-12 DIAGNOSIS — N186 End stage renal disease: Secondary | ICD-10-CM | POA: Diagnosis not present

## 2023-02-13 DIAGNOSIS — N186 End stage renal disease: Secondary | ICD-10-CM | POA: Diagnosis not present

## 2023-02-13 DIAGNOSIS — Z992 Dependence on renal dialysis: Secondary | ICD-10-CM | POA: Diagnosis not present

## 2023-02-16 ENCOUNTER — Encounter: Payer: Self-pay | Admitting: Vascular Surgery

## 2023-02-17 DIAGNOSIS — N2581 Secondary hyperparathyroidism of renal origin: Secondary | ICD-10-CM | POA: Diagnosis not present

## 2023-02-17 DIAGNOSIS — N186 End stage renal disease: Secondary | ICD-10-CM | POA: Diagnosis not present

## 2023-02-17 DIAGNOSIS — E8779 Other fluid overload: Secondary | ICD-10-CM | POA: Diagnosis not present

## 2023-02-17 DIAGNOSIS — Z992 Dependence on renal dialysis: Secondary | ICD-10-CM | POA: Diagnosis not present

## 2023-02-18 DIAGNOSIS — N2581 Secondary hyperparathyroidism of renal origin: Secondary | ICD-10-CM | POA: Diagnosis not present

## 2023-02-18 DIAGNOSIS — Z992 Dependence on renal dialysis: Secondary | ICD-10-CM | POA: Diagnosis not present

## 2023-02-18 DIAGNOSIS — N186 End stage renal disease: Secondary | ICD-10-CM | POA: Diagnosis not present

## 2023-02-18 DIAGNOSIS — E8779 Other fluid overload: Secondary | ICD-10-CM | POA: Diagnosis not present

## 2023-02-19 ENCOUNTER — Other Ambulatory Visit: Payer: Self-pay | Admitting: Internal Medicine

## 2023-02-19 DIAGNOSIS — R188 Other ascites: Secondary | ICD-10-CM

## 2023-02-19 DIAGNOSIS — N186 End stage renal disease: Secondary | ICD-10-CM | POA: Diagnosis not present

## 2023-02-19 DIAGNOSIS — Z992 Dependence on renal dialysis: Secondary | ICD-10-CM | POA: Diagnosis not present

## 2023-02-19 DIAGNOSIS — E8779 Other fluid overload: Secondary | ICD-10-CM | POA: Diagnosis not present

## 2023-02-19 DIAGNOSIS — N2581 Secondary hyperparathyroidism of renal origin: Secondary | ICD-10-CM | POA: Diagnosis not present

## 2023-02-22 DIAGNOSIS — N2581 Secondary hyperparathyroidism of renal origin: Secondary | ICD-10-CM | POA: Diagnosis not present

## 2023-02-22 DIAGNOSIS — E8779 Other fluid overload: Secondary | ICD-10-CM | POA: Diagnosis not present

## 2023-02-22 DIAGNOSIS — N186 End stage renal disease: Secondary | ICD-10-CM | POA: Diagnosis not present

## 2023-02-22 DIAGNOSIS — Z992 Dependence on renal dialysis: Secondary | ICD-10-CM | POA: Diagnosis not present

## 2023-02-23 IMAGING — CR DG CHEST 2V
2 series · 2 of 2 positions shown · non-contrast
Comparison: 02/01/2021

CLINICAL DATA: Chest pain

EXAM:
CHEST - 2 VIEW

[chest pa]
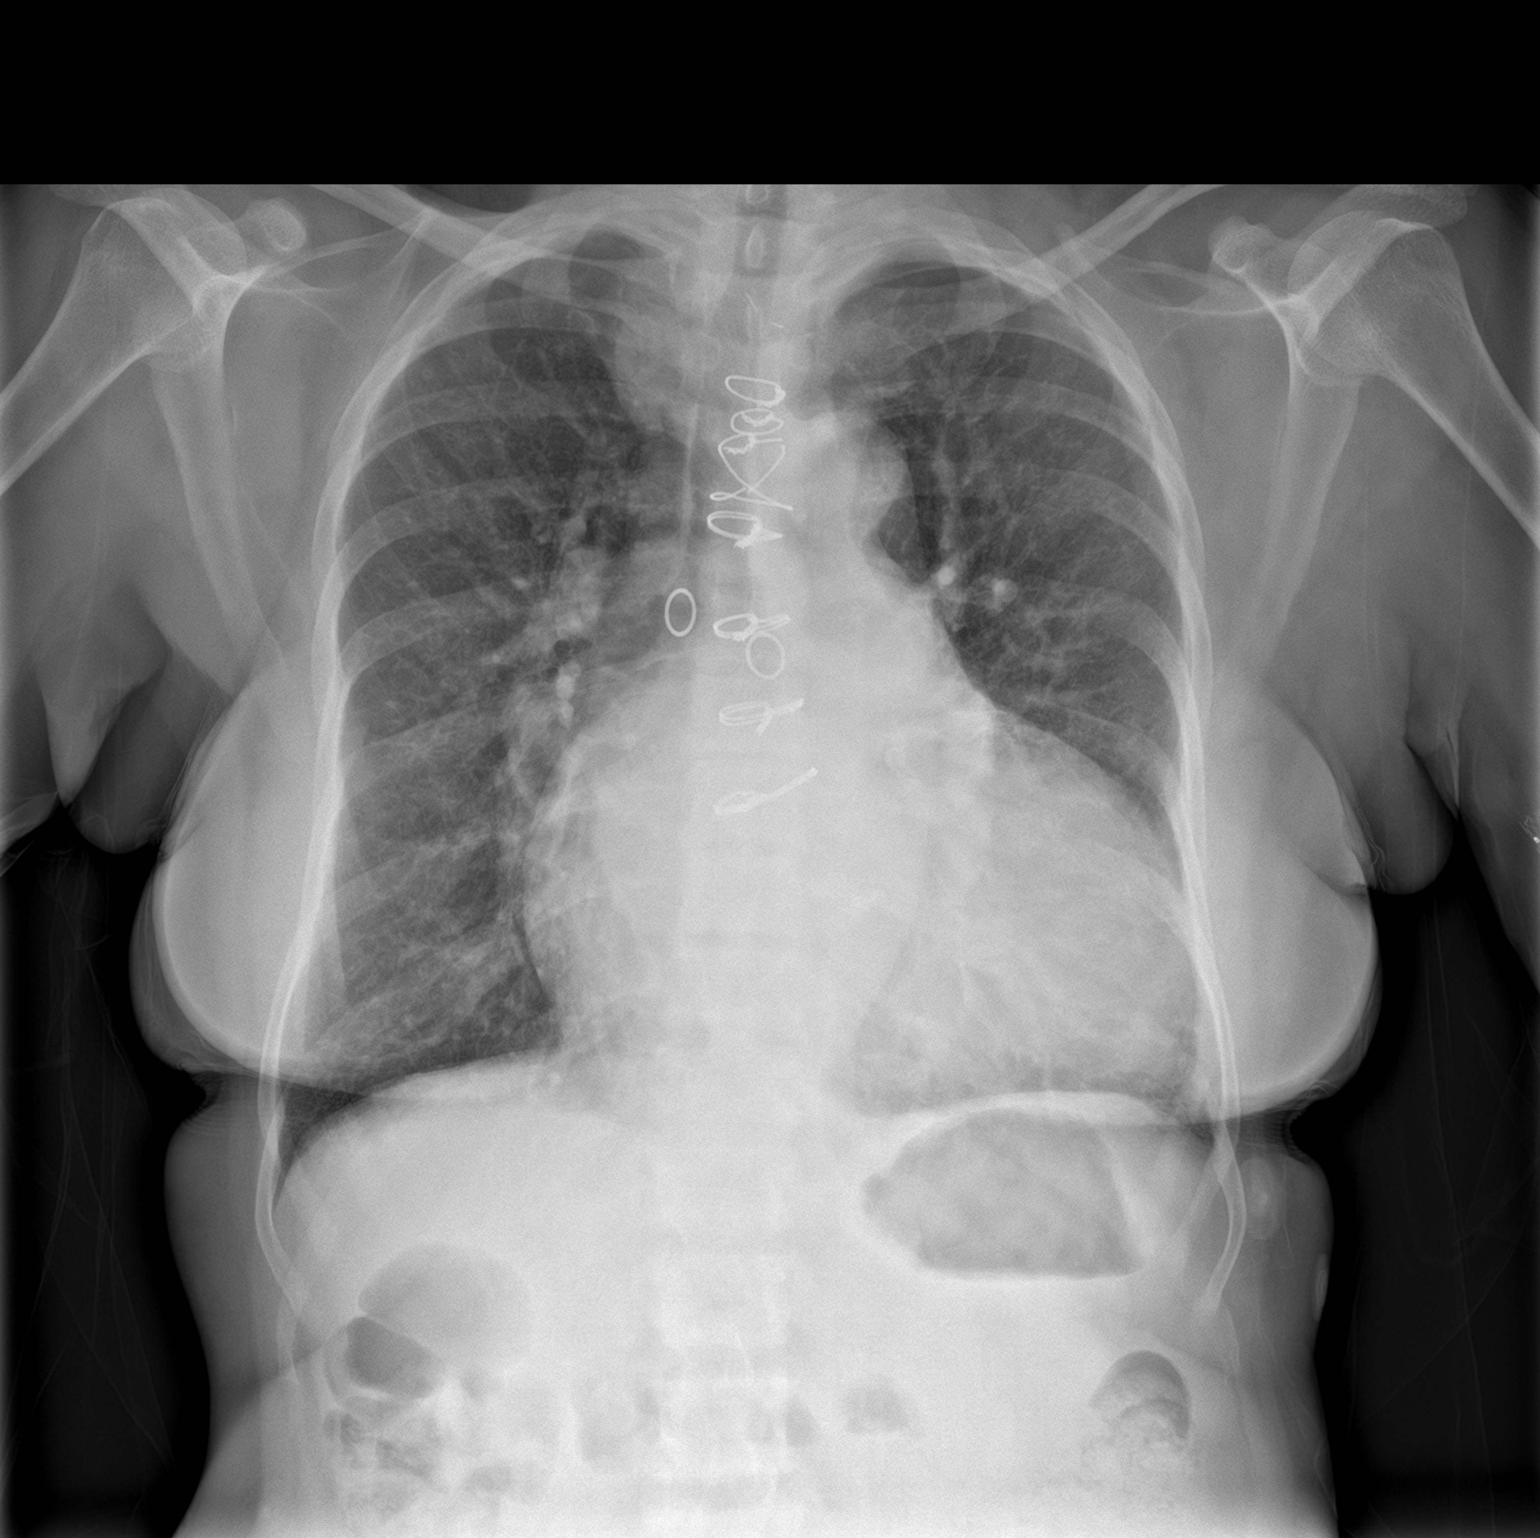

[chest lat]
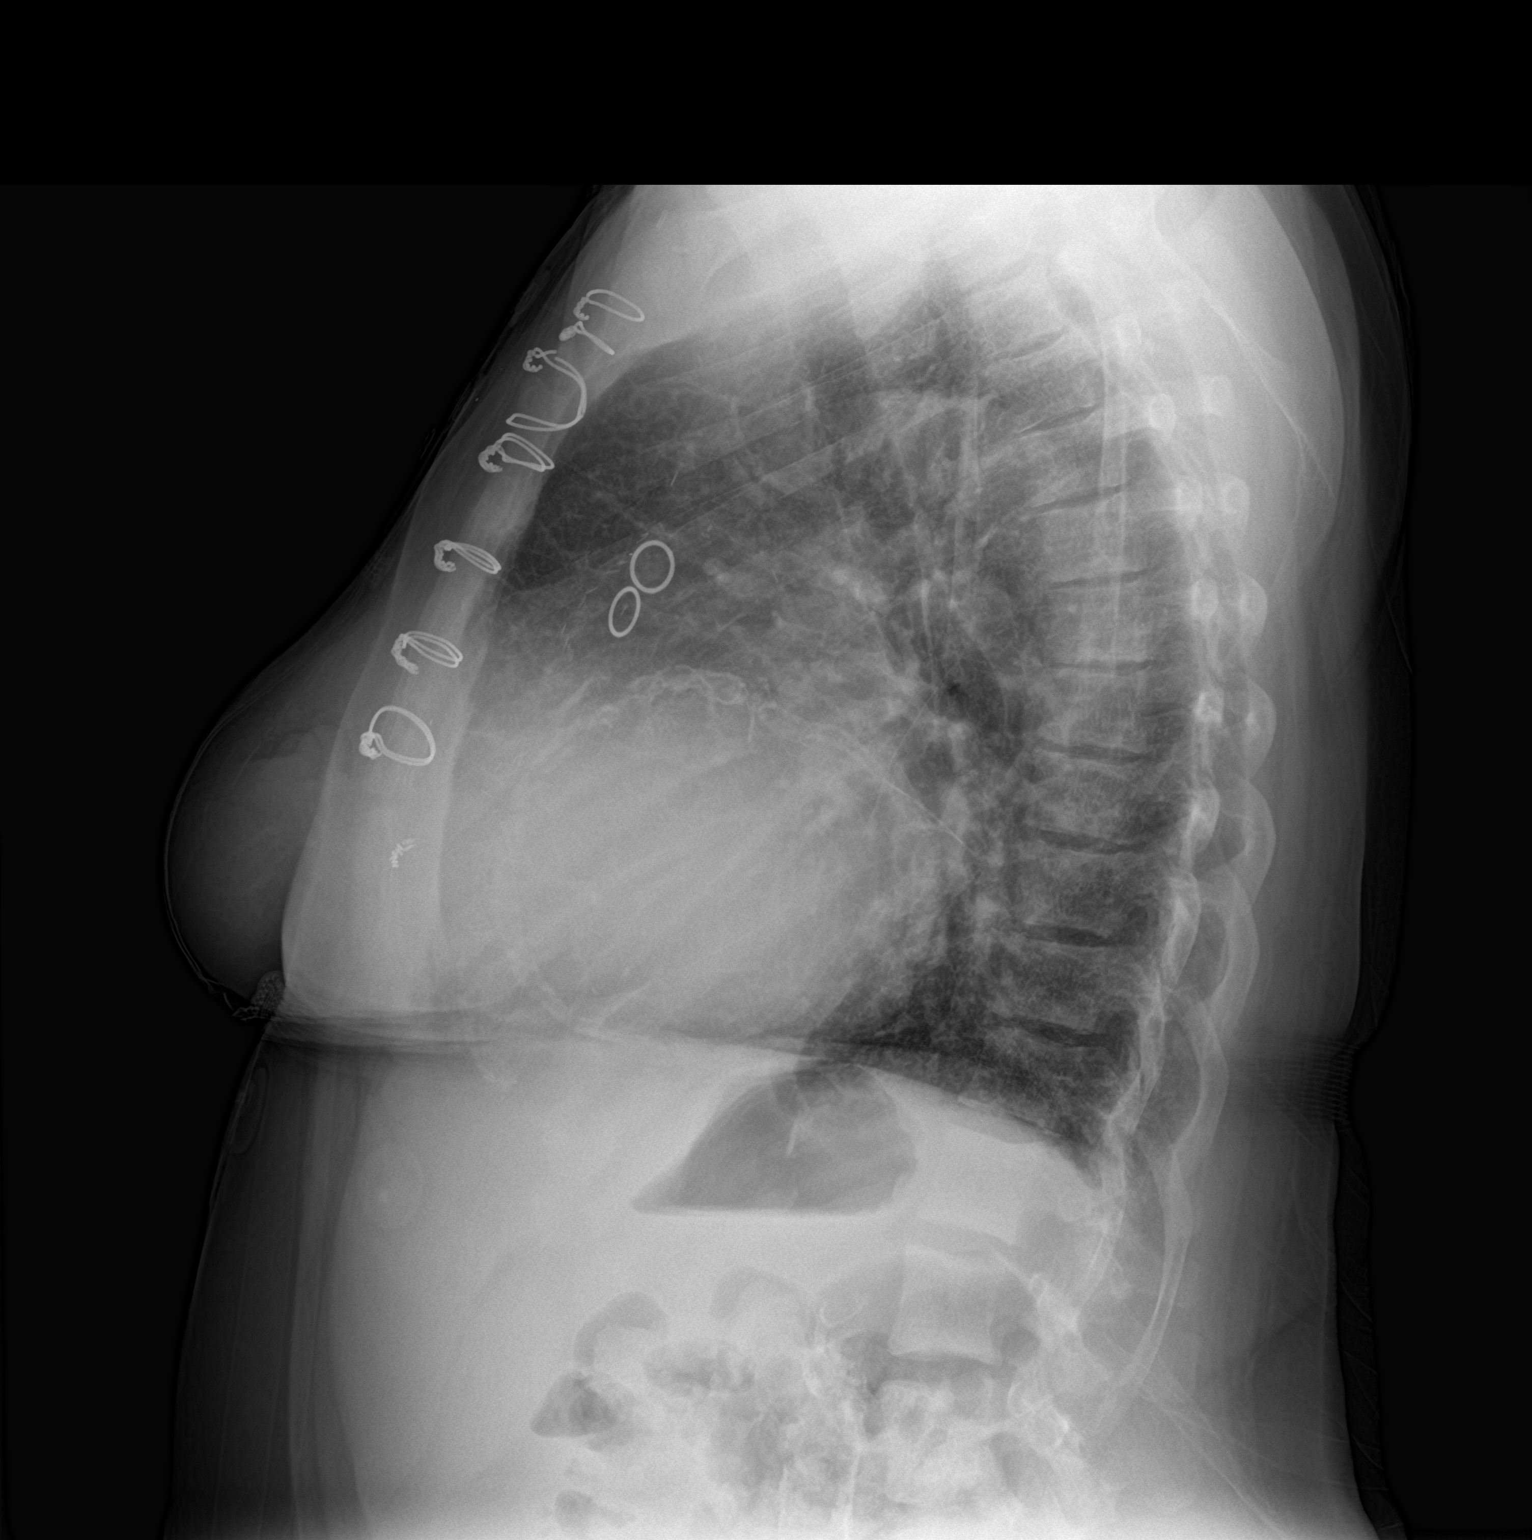

[2 of 2 positions shown; findings below may reference images not displayed]

FINDINGS: No new consolidation or edema. Chronic pulmonary vascular
congestion. Similar cardiomegaly. No pleural effusion or
pneumothorax. No acute osseous abnormality.
IMPRESSION: No acute process in the chest. Similar cardiomegaly and pulmonary
vascular congestion.

## 2023-02-23 IMAGING — CT CT HEAD W/O CM
4 series · 16 of 47 positions shown, 18 images · non-contrast
Comparison: PET-CT 10/19/2020

CLINICAL DATA: Pt presents to ED with c/o of chest pain and
headache and L sided mouth twitching. Pt states brother suggested pt
comes but pt does not have any complaints. Pt is [id number]. Pt has HX
of dialysis and strokeCerebral hemorrhage suspected

EXAM:
CT HEAD WITHOUT CONTRAST
TECHNIQUE: Contiguous axial images were obtained from the base of the skull
through the vertex without intravenous contrast.

[Series 2: head wo · axial · 0.44mm/px · z∈[+234,+344]mm · 6 of 32 slices shown, 8 images]
[im 5/32  brain]
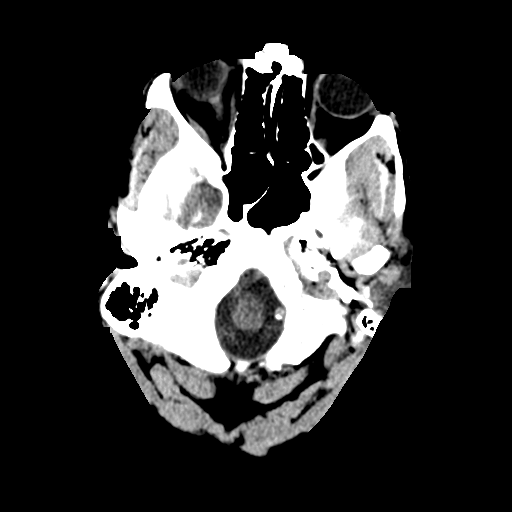
[im 5/32  bone]
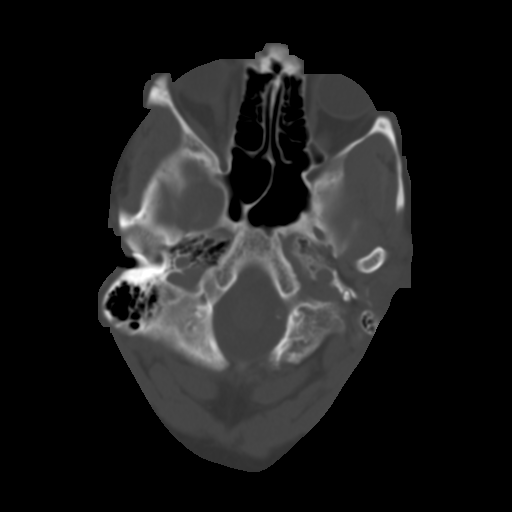
[im 9/32  brain]
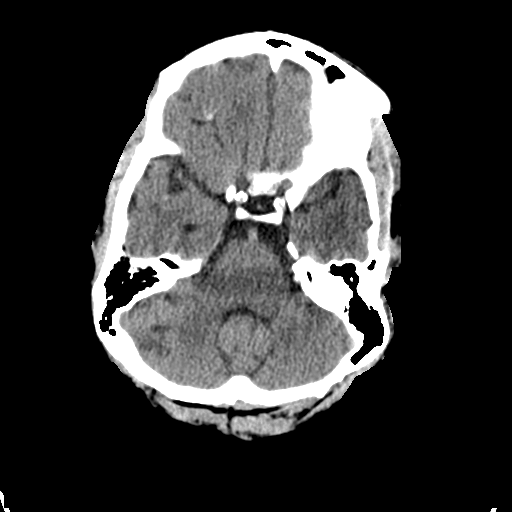
[im 14/32  brain]
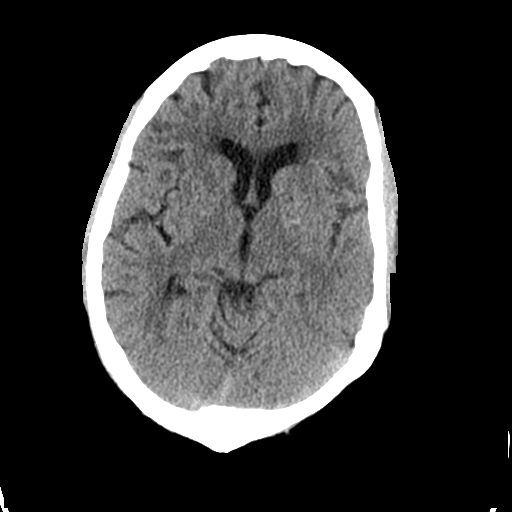
[im 18/32  brain]
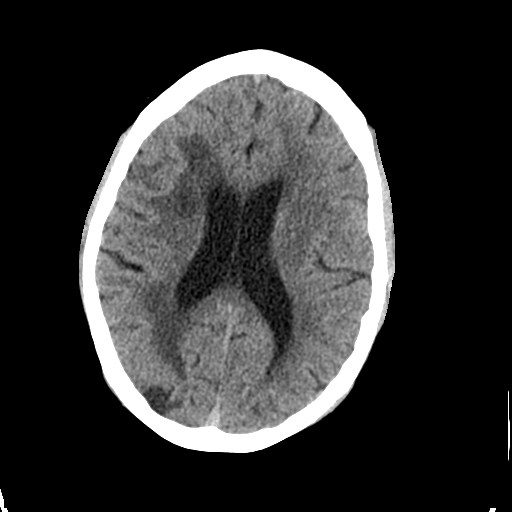
[im 23/32  brain]
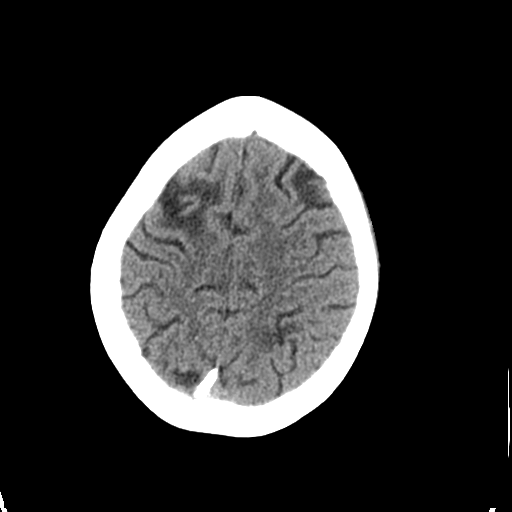
[im 23/32  bone]
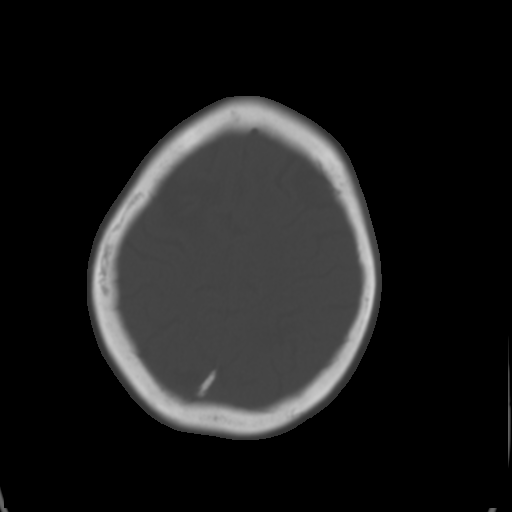
[im 27/32  brain]
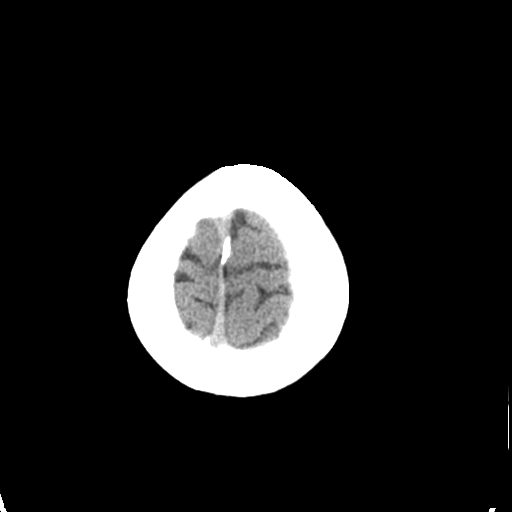

[Series 3: head bone · axial · 0.44mm/px · z∈[+228,+282]mm · 4 of 82 slices shown]
[im 8/82  bone]
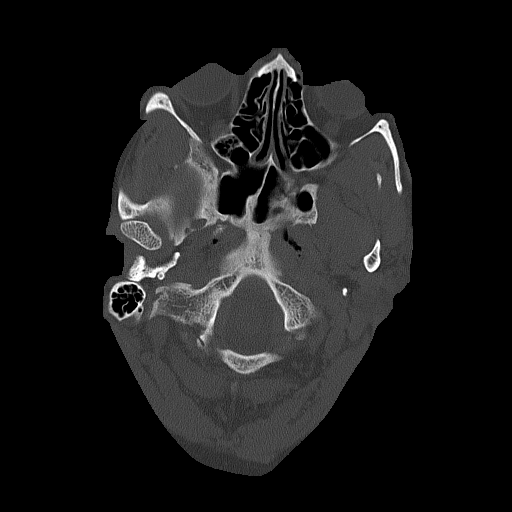
[im 16/82  bone]
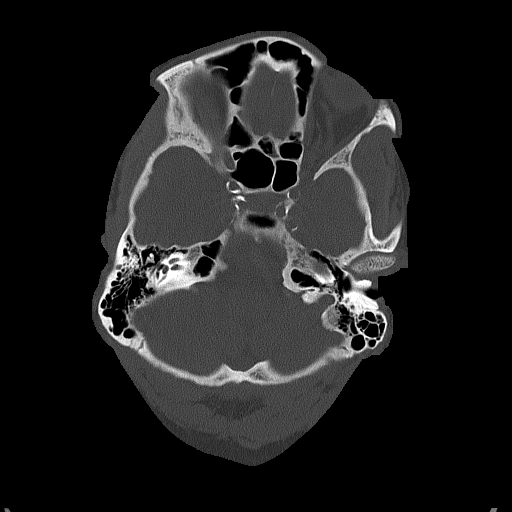
[im 28/82  bone]
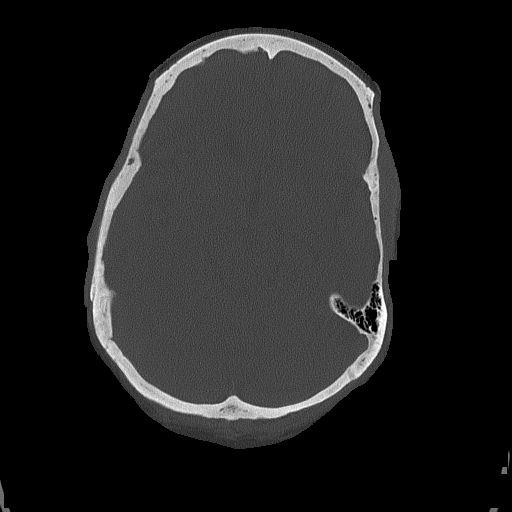
[im 35/82  bone]
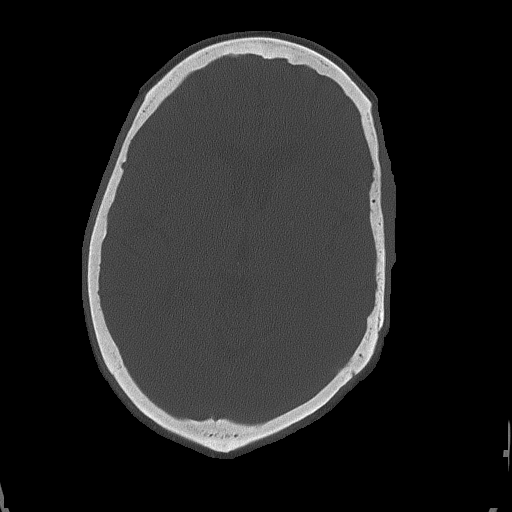

[Series 4: coronal soft tissue · coronal · 0.34mm/px · 3 of 72 slices shown]
[im 24/72  brain]
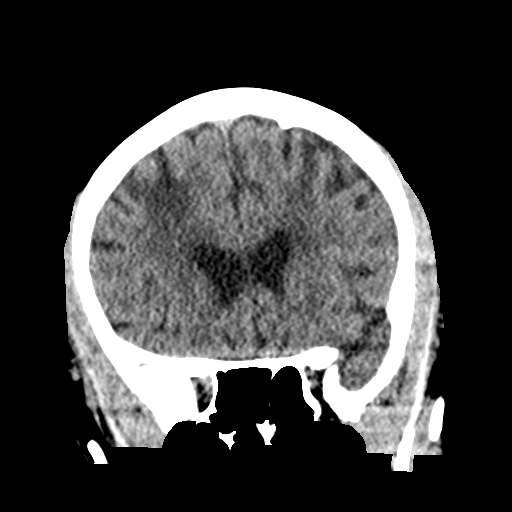
[im 32/72  brain]
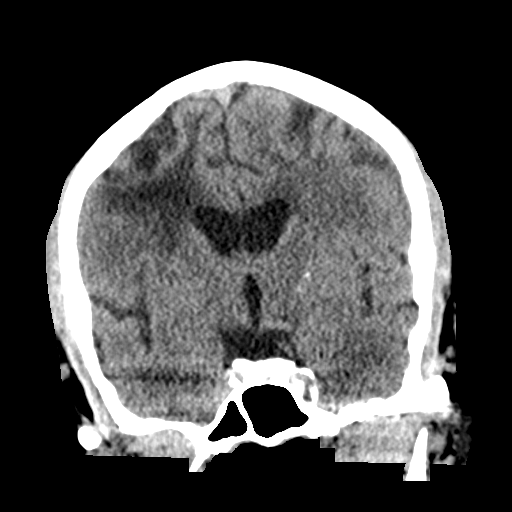
[im 40/72  brain]
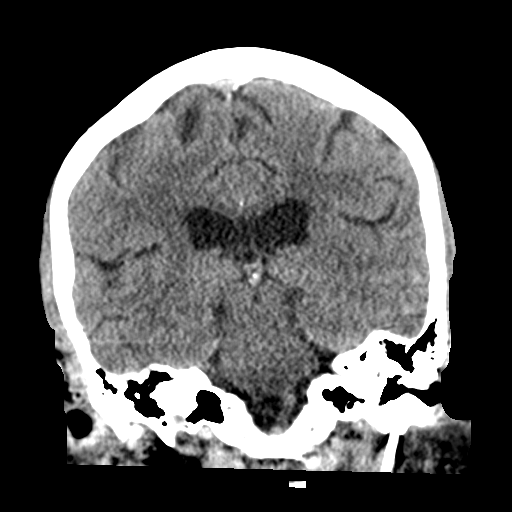

[Series 5: sagittal soft tissue · sagittal · 0.34mm/px · 3 of 59 slices shown]
[im 20/59  brain]
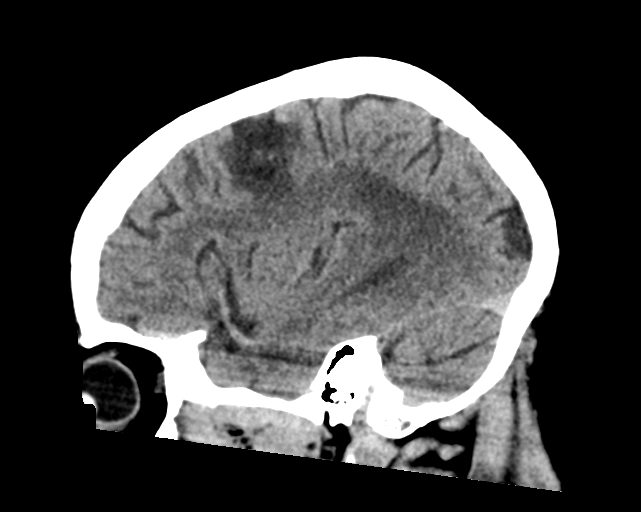
[im 30/59  brain]
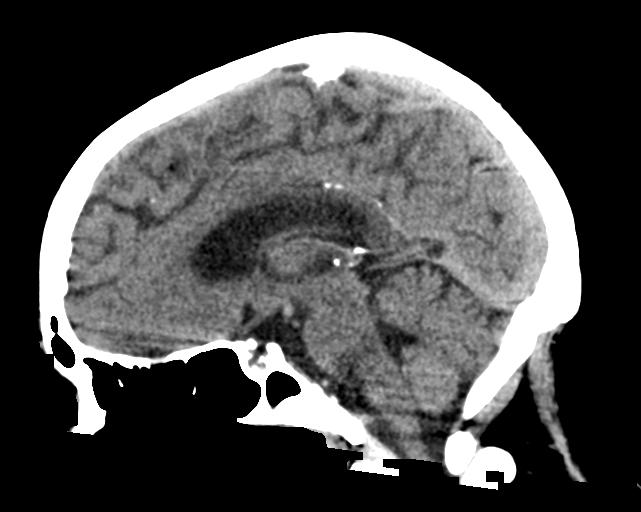
[im 39/59  brain]
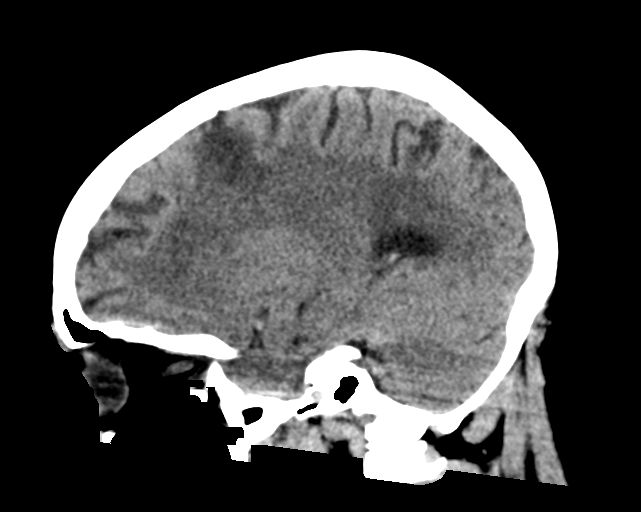

[16 of 47 positions shown; findings below may reference images not displayed]

FINDINGS: Brain: No acute intracranial hemorrhage. No focal mass lesion. No CT
evidence of acute infarction. No midline shift or mass effect. No
hydrocephalus. Basilar cisterns are patent.

Bilateral cortical hypodensities in the frontal lobes and parietal
lobes unchanged from comparison exam consistent remote cortical
infarctions.

There are periventricular and subcortical white matter
hypodensities. Generalized cortical atrophy.

Vascular: No hyperdense vessel or unexpected calcification.

Skull: Normal. Negative for fracture or focal lesion.

Sinuses/Orbits: Paranasal sinuses and mastoid air cells are clear.
Orbits are clear.

Other: None.
IMPRESSION: 1. No acute intracranial findings.
2. No change from comparison CT.
3. Bilateral cortical infarcts again noted.

## 2023-02-24 ENCOUNTER — Ambulatory Visit
Admission: RE | Admit: 2023-02-24 | Discharge: 2023-02-24 | Disposition: A | Discharge status: Home / Self Care | Payer: 59 | Source: Ambulatory Visit | Attending: Internal Medicine | Admitting: Internal Medicine

## 2023-02-24 DIAGNOSIS — R188 Other ascites: Secondary | ICD-10-CM | POA: Insufficient documentation

## 2023-02-24 DIAGNOSIS — N2581 Secondary hyperparathyroidism of renal origin: Secondary | ICD-10-CM | POA: Diagnosis not present

## 2023-02-24 DIAGNOSIS — E8779 Other fluid overload: Secondary | ICD-10-CM | POA: Diagnosis not present

## 2023-02-24 DIAGNOSIS — Z992 Dependence on renal dialysis: Secondary | ICD-10-CM | POA: Diagnosis not present

## 2023-02-24 DIAGNOSIS — N186 End stage renal disease: Secondary | ICD-10-CM | POA: Diagnosis not present

## 2023-02-24 DIAGNOSIS — I509 Heart failure, unspecified: Secondary | ICD-10-CM | POA: Diagnosis not present

## 2023-02-24 MED ORDER — LIDOCAINE HCL (PF) 1 % IJ SOLN
10.0000 mL | Freq: Once | INTRAMUSCULAR | Status: AC
  Administered 2023-02-24: 10 mL via INTRADERMAL
  Filled 2023-02-24: qty 10

## 2023-02-24 NOTE — Procedures (Addendum)
PROCEDURE SUMMARY:  Successful image-guided paracentesis from the right lower abdomen.  Yielded 2.1 liters of amber fluid.  No immediate complications.  EBL = trace. Patient tolerated well.   Specimen was not sent for labs.  Please see imaging section of Epic for full dictation.   Kennieth Francois PA-C 02/24/2023 11:47 AM

## 2023-02-25 ENCOUNTER — Encounter: Payer: Self-pay | Admitting: Family

## 2023-02-25 ENCOUNTER — Ambulatory Visit (INDEPENDENT_AMBULATORY_CARE_PROVIDER_SITE_OTHER): Payer: 59 | Admitting: Family

## 2023-02-25 VITALS — BP 137/62 | HR 59 | Resp 96 | Ht 66.0 in | Wt 158.0 lb

## 2023-02-25 DIAGNOSIS — S62647D Nondisplaced fracture of proximal phalanx of left little finger, subsequent encounter for fracture with routine healing: Secondary | ICD-10-CM | POA: Diagnosis not present

## 2023-02-25 DIAGNOSIS — M79672 Pain in left foot: Secondary | ICD-10-CM | POA: Diagnosis not present

## 2023-02-25 DIAGNOSIS — R5381 Other malaise: Secondary | ICD-10-CM

## 2023-02-25 MED ORDER — OXYCODONE-ACETAMINOPHEN 5-325 MG PO TABS: 1.0000 | ORAL_TABLET | Freq: Four times a day (QID) | ORAL | 0 refills | Status: DC | PRN

## 2023-02-26 DIAGNOSIS — Z992 Dependence on renal dialysis: Secondary | ICD-10-CM | POA: Diagnosis not present

## 2023-02-26 DIAGNOSIS — N186 End stage renal disease: Secondary | ICD-10-CM | POA: Diagnosis not present

## 2023-02-26 DIAGNOSIS — E8779 Other fluid overload: Secondary | ICD-10-CM | POA: Diagnosis not present

## 2023-02-26 DIAGNOSIS — N2581 Secondary hyperparathyroidism of renal origin: Secondary | ICD-10-CM | POA: Diagnosis not present

## 2023-03-01 DIAGNOSIS — N186 End stage renal disease: Secondary | ICD-10-CM | POA: Diagnosis not present

## 2023-03-01 DIAGNOSIS — Z992 Dependence on renal dialysis: Secondary | ICD-10-CM | POA: Diagnosis not present

## 2023-03-01 DIAGNOSIS — N2581 Secondary hyperparathyroidism of renal origin: Secondary | ICD-10-CM | POA: Diagnosis not present

## 2023-03-01 DIAGNOSIS — E8779 Other fluid overload: Secondary | ICD-10-CM | POA: Diagnosis not present

## 2023-03-03 DIAGNOSIS — N2581 Secondary hyperparathyroidism of renal origin: Secondary | ICD-10-CM | POA: Diagnosis not present

## 2023-03-03 DIAGNOSIS — E8779 Other fluid overload: Secondary | ICD-10-CM | POA: Diagnosis not present

## 2023-03-03 DIAGNOSIS — Z992 Dependence on renal dialysis: Secondary | ICD-10-CM | POA: Diagnosis not present

## 2023-03-03 DIAGNOSIS — N186 End stage renal disease: Secondary | ICD-10-CM | POA: Diagnosis not present

## 2023-03-05 ENCOUNTER — Other Ambulatory Visit: Payer: Self-pay | Admitting: Internal Medicine

## 2023-03-05 DIAGNOSIS — R188 Other ascites: Secondary | ICD-10-CM

## 2023-03-05 DIAGNOSIS — E8779 Other fluid overload: Secondary | ICD-10-CM | POA: Diagnosis not present

## 2023-03-05 DIAGNOSIS — N2581 Secondary hyperparathyroidism of renal origin: Secondary | ICD-10-CM | POA: Diagnosis not present

## 2023-03-05 DIAGNOSIS — N186 End stage renal disease: Secondary | ICD-10-CM | POA: Diagnosis not present

## 2023-03-05 DIAGNOSIS — Z992 Dependence on renal dialysis: Secondary | ICD-10-CM | POA: Diagnosis not present

## 2023-03-08 DIAGNOSIS — N186 End stage renal disease: Secondary | ICD-10-CM | POA: Diagnosis not present

## 2023-03-08 DIAGNOSIS — E8779 Other fluid overload: Secondary | ICD-10-CM | POA: Diagnosis not present

## 2023-03-08 DIAGNOSIS — N2581 Secondary hyperparathyroidism of renal origin: Secondary | ICD-10-CM | POA: Diagnosis not present

## 2023-03-08 DIAGNOSIS — Z992 Dependence on renal dialysis: Secondary | ICD-10-CM | POA: Diagnosis not present

## 2023-03-10 ENCOUNTER — Ambulatory Visit
Admission: RE | Admit: 2023-03-10 | Discharge: 2023-03-10 | Disposition: A | Discharge status: Home / Self Care | Payer: 59 | Source: Ambulatory Visit | Attending: Internal Medicine | Admitting: Internal Medicine

## 2023-03-10 DIAGNOSIS — Z992 Dependence on renal dialysis: Secondary | ICD-10-CM | POA: Diagnosis not present

## 2023-03-10 DIAGNOSIS — N186 End stage renal disease: Secondary | ICD-10-CM | POA: Diagnosis not present

## 2023-03-10 DIAGNOSIS — R188 Other ascites: Secondary | ICD-10-CM | POA: Diagnosis not present

## 2023-03-10 DIAGNOSIS — E8779 Other fluid overload: Secondary | ICD-10-CM | POA: Diagnosis not present

## 2023-03-10 DIAGNOSIS — N2581 Secondary hyperparathyroidism of renal origin: Secondary | ICD-10-CM | POA: Diagnosis not present

## 2023-03-10 MED ORDER — LIDOCAINE HCL (PF) 1 % IJ SOLN
10.0000 mL | Freq: Once | INTRAMUSCULAR | Status: AC
  Administered 2023-03-10: 10 mL via INTRADERMAL

## 2023-03-10 NOTE — Procedures (Signed)
PROCEDURE SUMMARY:  Successful image-guided paracentesis from the right lower abdomen.  Yielded 2.25 liters of clear yellow fluid.  No immediate complications.  EBL = trace. Patient tolerated well.   Specimen was not sent for labs.  Please see imaging section of Epic for full dictation.   Kennieth Francois PA-C 03/10/2023 2:02 PM

## 2023-03-12 DIAGNOSIS — N2581 Secondary hyperparathyroidism of renal origin: Secondary | ICD-10-CM | POA: Diagnosis not present

## 2023-03-12 DIAGNOSIS — Z992 Dependence on renal dialysis: Secondary | ICD-10-CM | POA: Diagnosis not present

## 2023-03-12 DIAGNOSIS — N186 End stage renal disease: Secondary | ICD-10-CM | POA: Diagnosis not present

## 2023-03-12 DIAGNOSIS — E8779 Other fluid overload: Secondary | ICD-10-CM | POA: Diagnosis not present

## 2023-03-15 DIAGNOSIS — N2581 Secondary hyperparathyroidism of renal origin: Secondary | ICD-10-CM | POA: Diagnosis not present

## 2023-03-15 DIAGNOSIS — E8779 Other fluid overload: Secondary | ICD-10-CM | POA: Diagnosis not present

## 2023-03-15 DIAGNOSIS — Z992 Dependence on renal dialysis: Secondary | ICD-10-CM | POA: Diagnosis not present

## 2023-03-15 DIAGNOSIS — N186 End stage renal disease: Secondary | ICD-10-CM | POA: Diagnosis not present

## 2023-03-17 DIAGNOSIS — N2581 Secondary hyperparathyroidism of renal origin: Secondary | ICD-10-CM | POA: Diagnosis not present

## 2023-03-17 DIAGNOSIS — Z992 Dependence on renal dialysis: Secondary | ICD-10-CM | POA: Diagnosis not present

## 2023-03-17 DIAGNOSIS — Z23 Encounter for immunization: Secondary | ICD-10-CM | POA: Diagnosis not present

## 2023-03-17 DIAGNOSIS — N186 End stage renal disease: Secondary | ICD-10-CM | POA: Diagnosis not present

## 2023-03-17 DIAGNOSIS — E8779 Other fluid overload: Secondary | ICD-10-CM | POA: Diagnosis not present

## 2023-03-18 ENCOUNTER — Other Ambulatory Visit: Payer: Self-pay | Admitting: Internal Medicine

## 2023-03-18 DIAGNOSIS — Z992 Dependence on renal dialysis: Secondary | ICD-10-CM | POA: Diagnosis not present

## 2023-03-18 DIAGNOSIS — Z23 Encounter for immunization: Secondary | ICD-10-CM | POA: Diagnosis not present

## 2023-03-18 DIAGNOSIS — N186 End stage renal disease: Secondary | ICD-10-CM | POA: Diagnosis not present

## 2023-03-18 DIAGNOSIS — R188 Other ascites: Secondary | ICD-10-CM

## 2023-03-18 DIAGNOSIS — N2581 Secondary hyperparathyroidism of renal origin: Secondary | ICD-10-CM | POA: Diagnosis not present

## 2023-03-18 DIAGNOSIS — E8779 Other fluid overload: Secondary | ICD-10-CM | POA: Diagnosis not present

## 2023-03-19 DIAGNOSIS — Z23 Encounter for immunization: Secondary | ICD-10-CM | POA: Diagnosis not present

## 2023-03-19 DIAGNOSIS — N186 End stage renal disease: Secondary | ICD-10-CM | POA: Diagnosis not present

## 2023-03-19 DIAGNOSIS — N2581 Secondary hyperparathyroidism of renal origin: Secondary | ICD-10-CM | POA: Diagnosis not present

## 2023-03-19 DIAGNOSIS — E8779 Other fluid overload: Secondary | ICD-10-CM | POA: Diagnosis not present

## 2023-03-19 DIAGNOSIS — Z992 Dependence on renal dialysis: Secondary | ICD-10-CM | POA: Diagnosis not present

## 2023-03-22 ENCOUNTER — Other Ambulatory Visit (INDEPENDENT_AMBULATORY_CARE_PROVIDER_SITE_OTHER): Payer: Self-pay | Admitting: Vascular Surgery

## 2023-03-22 DIAGNOSIS — N186 End stage renal disease: Secondary | ICD-10-CM

## 2023-03-22 DIAGNOSIS — E8779 Other fluid overload: Secondary | ICD-10-CM | POA: Diagnosis not present

## 2023-03-22 DIAGNOSIS — Z992 Dependence on renal dialysis: Secondary | ICD-10-CM | POA: Diagnosis not present

## 2023-03-22 DIAGNOSIS — N2581 Secondary hyperparathyroidism of renal origin: Secondary | ICD-10-CM | POA: Diagnosis not present

## 2023-03-22 DIAGNOSIS — Z23 Encounter for immunization: Secondary | ICD-10-CM | POA: Diagnosis not present

## 2023-03-23 ENCOUNTER — Ambulatory Visit (INDEPENDENT_AMBULATORY_CARE_PROVIDER_SITE_OTHER): Payer: 59 | Admitting: Cardiovascular Disease

## 2023-03-23 ENCOUNTER — Encounter: Payer: Self-pay | Admitting: Cardiovascular Disease

## 2023-03-23 VITALS — BP 95/65 | Ht 66.0 in | Wt 160.0 lb

## 2023-03-23 DIAGNOSIS — R0789 Other chest pain: Secondary | ICD-10-CM

## 2023-03-23 DIAGNOSIS — Z992 Dependence on renal dialysis: Secondary | ICD-10-CM

## 2023-03-23 DIAGNOSIS — I480 Paroxysmal atrial fibrillation: Secondary | ICD-10-CM | POA: Diagnosis not present

## 2023-03-23 DIAGNOSIS — N186 End stage renal disease: Secondary | ICD-10-CM | POA: Diagnosis not present

## 2023-03-23 DIAGNOSIS — E782 Mixed hyperlipidemia: Secondary | ICD-10-CM | POA: Diagnosis not present

## 2023-03-23 DIAGNOSIS — I739 Peripheral vascular disease, unspecified: Secondary | ICD-10-CM

## 2023-03-23 DIAGNOSIS — I5020 Unspecified systolic (congestive) heart failure: Secondary | ICD-10-CM | POA: Diagnosis not present

## 2023-03-23 NOTE — Progress Notes (Signed)
Cardiology Office Note   Date:  03/23/2023   ID:  [name], DOB 30-Nov-1960, MRN [id number]  PCP:  Miki Kins, FNP  Cardiologist:  Adrian Blackwater, MD      History of Present Illness: [name] is a 62 y.o. female who presents for  Chief Complaint  Patient presents with   Follow-up    4 mo f/u    Chest Pain  This is a new problem. The current episode started 1 to 4 weeks ago. The problem has been waxing and waning.      Past Medical History:  Diagnosis Date   Adult behavior problems    frontal lobe CVA   Anemia    chronic disease   Atrial flutter by electrocardiogram (HCC) 03/22/2014   Cardiomyopathy (HCC)    CHF (congestive heart failure) (HCC)    Chicken pox    Chronic respiratory failure with hypoxia (HCC) 12/19/2021   Coronary artery disease    Delayed surgical wound healing 04/12/2014   Overview:  Left leg   GERD (gastroesophageal reflux disease)    H/O atrial flutter 04/12/2014   H/O stroke without residual deficits 02/13/2014   H/O: substance abuse (HCC) 02/27/2014   Heart failure (HCC)    Hepatitis    history of hep c   History of CVA (cerebrovascular accident) 02/21/2014   History of delirium 04/12/2014   Hyperkalemia 11/13/2014   Hyperlipidemia    Hypertension    Hypotension 02/24/2014   Leukocytosis 02/26/2014   Myocardial infarction [facility])    Obesity    Paroxysmal atrial fibrillation (HCC)    Peripheral vascular disease (HCC)    Postoperative anemia due to acute blood loss 02/27/2014   Renal failure    Renal insufficiency    Stroke [facility]) 2011   Superficial incisional surgical site infection 03/14/2014   Syncope and collapse 07/25/2020     Past Surgical History:  Procedure Laterality Date   A/V FISTULAGRAM Left 03/30/2019   Procedure: A/V FISTULAGRAM;  Surgeon: Annice Needy, MD;  Location: [facility] INVASIVE CV LAB;  Service: Cardiovascular;  Laterality: Left;   A/V FISTULAGRAM Left 10/06/2019   Procedure: A/V  FISTULAGRAM;  Surgeon: Annice Needy, MD;  Location: [facility] IN[facility] CV [facility];  Service: Cardiovascular;  Laterality: Left;   A/V FISTULAGRAM Left 05/19/2021   Procedure: A/V FISTULAGRAM;  Surgeon: Annice Needy, MD;  Location: [facility];  Service: Cardiovascular;  Laterality: Left;   A/V FISTULAGRAM Left 01/29/2022   Procedure: A/V Fistulagram;  Surgeon: Annice Needy, MD;  Location: [facility];  Service: Cardiovascular;  Laterality: Left;   A/V FISTULAGRAM Left 04/30/2022   Procedure: A/V Fistulagram;  Surgeon: Annice Needy, MD;  Location: [facility];  Service: Cardiovascular;  Laterality: Left;   A/V FISTULAGRAM Left 08/20/2022   Procedure: A/V Fistulagram;  Surgeon: Annice Needy, MD;  Location: [facility];  Service: Cardiovascular;  Laterality: Left;   A/V FISTULAGRAM N/A 12/03/2022   Procedure: A/V Fistulagram;  Surgeon: Annice Needy, MD;  Location: [facility];  Service: Cardiovascular;  Laterality: N/A;   A/V FISTULAGRAM Left 02/11/2023   Procedure: A/V Fistulagram;  Surgeon: Annice Needy, MD;  Location: [facility];  Service: Cardiovascular;  Laterality: Left;   COLONOSCOPY     COLONOSCOPY WITH PROPOFOL N/A 04/22/2021   Procedure: COLONOSCOPY WITH PROPOFOL;  Surgeon: Regis Bill, MD;  Location: [facility] ENDOSCOPY;  Service: Endoscopy;  Laterality: N/A;  CORONARY ANGIOPLASTY WITH STENT PLACEMENT  2013   CORONARY ARTERY BYPASS GRAFT     DIALYSIS FISTULA CREATION     ESOPHAGOGASTRODUODENOSCOPY     ESOPHAGOGASTRODUODENOSCOPY N/A 04/22/2021   Procedure: ESOPHAGOGASTRODUODENOSCOPY (EGD);  Surgeon: Regis Bill, MD;  Location: [facility] ENDOSCOPY;  Service: Endoscopy;  Laterality: N/A;   FLEXIBLE SIGMOIDOSCOPY N/A 02/23/2019   Procedure: FLEXIBLE SIGMOIDOSCOPY;  Surgeon: Christena Deem, MD;  Location: [facility] ENDOSCOPY;  Service: Endoscopy;  Laterality: N/A;   LEFT HEART CATH AND CORS/GRAFTS ANGIOGRAPHY N/A 08/08/2018   Procedure: LEFT  HEART CATH AND CORS/GRAFTS ANGIOGRAPHY;  Surgeon: Laurier Nancy, MD;  Location: [facility] INV[facility] CV [facility];  Service: Cardiovascular;  Laterality: N/A;   LEFT HEART CATH AND CORS/GRAFTS ANGIOGRAPHY N/A 01/30/2019   Procedure: LEFT HEART CATH AND CORS/GRAFTS ANGIOGRAPHY;  Surgeon: Laurier Nancy, MD;  Location: [facility] CV [facility];  Service: Cardiovascular;  Laterality: N/A;   percutaneous insertion intra aortic balloon right cath     ultrasound guided pericardiocentesis       Current Outpatient Medications  Medication Sig Dispense Refill   albuterol (VENTOLIN HFA) 108 (90 Base) MCG/ACT inhaler Inhale 1-2 puffs into the lungs every 6 (six) hours as needed for wheezing or shortness of breath. 20.1 g 3   amiodarone (PACERONE) 200 MG tablet Take 200 mg by mouth daily.     apixaban (ELIQUIS) 5 MG TABS tablet Take 5 mg by mouth 2 (two) times daily.     Bismuth Tribromoph-Petrolatum (XEROFORM PETROLAT PATCH 2"X2") PADS Apply 1 each topically every 3 (three) days. 25 each 0   colchicine 0.6 MG tablet Take 0.6 mg by mouth daily. (Patient not taking: Reported on 02/11/2023)     hydrOXYzine (ATARAX/VISTARIL) 50 MG tablet Take 50 mg by mouth at bedtime.     levothyroxine (SYNTHROID) 75 MCG tablet Take 75 mcg by mouth daily before breakfast.      lidocaine-prilocaine (EMLA) cream Apply 1 application topically daily as needed (port access).     midodrine (PROAMATINE) 10 MG tablet Take 1 tablet (10 mg total) by mouth 3 (three) times a week. Monday, Wednesday, Friday with hemodialysis     omeprazole (PRILOSEC) 20 MG capsule Take 20 mg by mouth daily.     oxyCODONE-acetaminophen (PERCOCET/ROXICET) 5-325 MG tablet Take 1 tablet by mouth every 6 (six) hours as needed for severe pain. 20 tablet 0   pantoprazole (PROTONIX) 40 MG tablet Take 40 mg by mouth daily.     rosuvastatin (CRESTOR) 20 MG tablet Take 20 mg by mouth at bedtime.     sevelamer carbonate (RENVELA) 800 MG tablet Take 3,200 mg by mouth 3 (three)  times daily with meals.     VELTASSA 8.4 g packet Take 1 packet by mouth daily.     No current facility-administered medications for this visit.    Allergies:   Morphine and codeine, Levaquin [levofloxacin], Enalapril, Other, Latex, and Tape    Social History:   reports that she has been smoking cigarettes. She has a 5 pack-year smoking history. She has never used smokeless tobacco. She reports that she does not drink alcohol and does not use drugs.   Family History:  family history includes Breast cancer in her maternal aunt and maternal grandmother; Cancer in an other family member; Diabetes type II in her maternal grandmother and mother; Hypertension in her father, mother, sister, and another family member; Kidney disease in her father; Ovarian cancer in her mother; Renal Disease in an other family member.  ROS:     Review of Systems  Constitutional: Negative.   HENT: Negative.    Eyes: Negative.   Respiratory: Negative.    Cardiovascular:  Positive for chest pain.  Gastrointestinal: Negative.   Genitourinary: Negative.   Musculoskeletal: Negative.   Skin: Negative.   Neurological: Negative.   Endo/Heme/Allergies: Negative.   Psychiatric/Behavioral: Negative.    All other systems reviewed and are negative.     All other systems are reviewed and negative.    PHYSICAL EXAM: VS:  BP 95/65   Ht 5\' 6"  (1.676 m)   Wt 160 lb (72.6 kg)   BMI 25.82 kg/m  , BMI Body mass index is 25.82 kg/m. Last weight:  Wt Readings from Last 3 Encounters:  03/23/23 160 lb (72.6 kg)  02/25/23 158 lb (71.7 kg)  02/11/23 145 lb (65.8 kg)     Physical Exam Constitutional:      Appearance: Normal appearance.  Cardiovascular:     Rate and Rhythm: Normal rate and regular rhythm.     Heart sounds: Normal heart sounds.  Pulmonary:     Effort: Pulmonary effort is normal.     Breath sounds: Normal breath sounds.  Musculoskeletal:     Right lower leg: No edema.     Left lower leg: No  edema.  Neurological:     Mental Status: She is alert.       EKG:   Recent Labs: No results found for requested labs within last 365 days.    Lipid Panel    Component Value Date/Time   CHOL 140 01/18/2019 1358   CHOL 79 06/14/2014 0404   TRIG 148 01/18/2019 1358   TRIG 85 06/14/2014 0404   HDL 43 01/18/2019 1358   HDL 35 (L) 06/14/2014 0404   CHOLHDL 3.3 01/18/2019 1358   VLDL 30 01/18/2019 1358   VLDL 17 06/14/2014 0404   LDLCALC 67 01/18/2019 1358   LDLCALC 27 06/14/2014 0404      Other studies Reviewed: Additional studies/ records that were reviewed today include:  Review of the above records demonstrates:       No data to display            ASSESSMENT AND PLAN:    ICD-10-CM   1. Other chest pain  R07.89 PCV ECHOCARDIOGRAM COMPLETE    MYOCARDIAL PERFUSION IMAGING   Had chest pain and has ascities, needing paracentesis, will do echo to r/o PE and do stress test    2. ESRD on hemodialysis (HCC)  N18.6 PCV ECHOCARDIOGRAM COMPLETE   Z99.2 MYOCARDIAL PERFUSION IMAGING    3. Peripheral vascular disease of lower extremity (HCC)  I73.9 PCV ECHOCARDIOGRAM COMPLETE    MYOCARDIAL PERFUSION IMAGING    4. Paroxysmal atrial fibrillation (HCC)  I48.0 PCV ECHOCARDIOGRAM COMPLETE    MYOCARDIAL PERFUSION IMAGING    5. Mixed hyperlipidemia  E78.2 PCV ECHOCARDIOGRAM COMPLETE    MYOCARDIAL PERFUSION IMAGING    6. Systolic congestive heart failure, unspecified HF chronicity (HCC)  I50.20 PCV ECHOCARDIOGRAM COMPLETE    MYOCARDIAL PERFUSION IMAGING       Problem List Items Addressed This Visit       Cardiovascular and Mediastinum   Paroxysmal atrial fibrillation (HCC)   Relevant Orders   PCV ECHOCARDIOGRAM COMPLETE   MYOCARDIAL PERFUSION IMAGING   Peripheral vascular disease of lower extremity (HCC)   Relevant Orders   PCV ECHOCARDIOGRAM COMPLETE   MYOCARDIAL PERFUSION IMAGING     Genitourinary   ESRD on hemodialysis (HCC) (Chronic)  Relevant Orders    PCV ECHOCARDIOGRAM COMPLETE   MYOCARDIAL PERFUSION IMAGING     Other   Hyperlipidemia   Relevant Orders   PCV ECHOCARDIOGRAM COMPLETE   MYOCARDIAL PERFUSION IMAGING   Other Visit Diagnoses     Other chest pain    -  Primary   Had chest pain and has ascities, needing paracentesis, will do echo to r/o PE and do stress test   Relevant Orders   PCV ECHOCARDIOGRAM COMPLETE   MYOCARDIAL PERFUSION IMAGING   Systolic congestive heart failure, unspecified HF chronicity (HCC)       Relevant Orders   PCV ECHOCARDIOGRAM COMPLETE   MYOCARDIAL PERFUSION IMAGING          Disposition:   No follow-ups on file.    Total time spent: 30 minutes  Signed,  Adrian Blackwater, MD  03/23/2023 10:32 AM    [facility]

## 2023-03-24 DIAGNOSIS — E8779 Other fluid overload: Secondary | ICD-10-CM | POA: Diagnosis not present

## 2023-03-24 DIAGNOSIS — N186 End stage renal disease: Secondary | ICD-10-CM | POA: Diagnosis not present

## 2023-03-24 DIAGNOSIS — Z23 Encounter for immunization: Secondary | ICD-10-CM | POA: Diagnosis not present

## 2023-03-24 DIAGNOSIS — Z992 Dependence on renal dialysis: Secondary | ICD-10-CM | POA: Diagnosis not present

## 2023-03-24 DIAGNOSIS — N2581 Secondary hyperparathyroidism of renal origin: Secondary | ICD-10-CM | POA: Diagnosis not present

## 2023-03-25 ENCOUNTER — Other Ambulatory Visit (INDEPENDENT_AMBULATORY_CARE_PROVIDER_SITE_OTHER): Payer: Self-pay | Admitting: Nurse Practitioner

## 2023-03-25 ENCOUNTER — Ambulatory Visit (INDEPENDENT_AMBULATORY_CARE_PROVIDER_SITE_OTHER): Payer: 59 | Admitting: Nurse Practitioner

## 2023-03-25 ENCOUNTER — Encounter (INDEPENDENT_AMBULATORY_CARE_PROVIDER_SITE_OTHER): Payer: 59

## 2023-03-25 ENCOUNTER — Ambulatory Visit
Admission: RE | Admit: 2023-03-25 | Discharge: 2023-03-25 | Disposition: A | Discharge status: Home / Self Care | Payer: 59 | Source: Ambulatory Visit | Attending: Internal Medicine | Admitting: Internal Medicine

## 2023-03-25 ENCOUNTER — Ambulatory Visit (INDEPENDENT_AMBULATORY_CARE_PROVIDER_SITE_OTHER): Payer: 59

## 2023-03-25 ENCOUNTER — Encounter (INDEPENDENT_AMBULATORY_CARE_PROVIDER_SITE_OTHER): Payer: Self-pay | Admitting: Nurse Practitioner

## 2023-03-25 VITALS — BP 110/70 | HR 59 | Resp 18 | Ht 66.0 in | Wt 160.0 lb

## 2023-03-25 DIAGNOSIS — R188 Other ascites: Secondary | ICD-10-CM | POA: Insufficient documentation

## 2023-03-25 DIAGNOSIS — E785 Hyperlipidemia, unspecified: Secondary | ICD-10-CM

## 2023-03-25 DIAGNOSIS — N186 End stage renal disease: Secondary | ICD-10-CM

## 2023-03-25 DIAGNOSIS — T148XXA Other injury of unspecified body region, initial encounter: Secondary | ICD-10-CM | POA: Diagnosis not present

## 2023-03-25 DIAGNOSIS — I10 Essential (primary) hypertension: Secondary | ICD-10-CM

## 2023-03-25 MED ORDER — LIDOCAINE HCL (PF) 1 % IJ SOLN
20.0000 mL | Freq: Once | INTRAMUSCULAR | Status: AC
  Administered 2023-03-25: 20 mL via INTRADERMAL
  Filled 2023-03-25: qty 20

## 2023-03-25 MED ORDER — DOXYCYCLINE HYCLATE 100 MG PO CAPS: 100.0000 mg | ORAL_CAPSULE | Freq: Two times a day (BID) | ORAL | 0 refills | Status: DC

## 2023-03-25 NOTE — Procedures (Signed)
PROCEDURE SUMMARY:  Successful US guided paracentesis from right abdomen.  Yielded 2.2 L of clear yellow fluid.  No immediate complications.  Pt tolerated well.   Specimen not sent for labs.  EBL < 2 mL  Mickie Kay, NP 03/25/2023 3:45 PM

## 2023-03-26 DIAGNOSIS — Z992 Dependence on renal dialysis: Secondary | ICD-10-CM | POA: Diagnosis not present

## 2023-03-26 DIAGNOSIS — I509 Heart failure, unspecified: Secondary | ICD-10-CM | POA: Diagnosis not present

## 2023-03-26 DIAGNOSIS — N2581 Secondary hyperparathyroidism of renal origin: Secondary | ICD-10-CM | POA: Diagnosis not present

## 2023-03-26 DIAGNOSIS — E8779 Other fluid overload: Secondary | ICD-10-CM | POA: Diagnosis not present

## 2023-03-26 DIAGNOSIS — Z23 Encounter for immunization: Secondary | ICD-10-CM | POA: Diagnosis not present

## 2023-03-26 DIAGNOSIS — N186 End stage renal disease: Secondary | ICD-10-CM | POA: Diagnosis not present

## 2023-03-27 ENCOUNTER — Encounter: Payer: Self-pay | Admitting: Family

## 2023-03-27 DIAGNOSIS — R5381 Other malaise: Secondary | ICD-10-CM | POA: Insufficient documentation

## 2023-03-27 NOTE — Progress Notes (Signed)
Established Patient Office Visit  Subjective:  Patient ID: [name], female    DOB: June 26, 1960  Age: 62 y.o. MRN: [id number]  Chief Complaint  Patient presents with   Follow-up    Left foot right hand pain    Patient is here today with pain in her right hand and in her left foot.  She says that the pain in her foot is significantly worse than her hand, and she thinks that the pain in her henad is porbably secondary to the fracture she had previously.   She does have known deformities in her left foot, and she has previously been set up with a podiatrist, but they weren't able to get her in immediately.   She needs a refill on her meds.      No other concerns at this time.   Past Medical History:  Diagnosis Date   Adult behavior problems    frontal lobe CVA   Anemia    chronic disease   Atrial flutter by electrocardiogram (HCC) 03/22/2014   Cardiomyopathy (HCC)    CHF (congestive heart failure) (HCC)    Chicken pox    Chronic respiratory failure with hypoxia (HCC) 12/19/2021   Coronary artery disease    Delayed surgical wound healing 04/12/2014   Overview:  Left leg   GERD (gastroesophageal reflux disease)    H/O atrial flutter 04/12/2014   H/O stroke without residual deficits 02/13/2014   H/O: substance abuse (HCC) 02/27/2014   Heart failure (HCC)    Hepatitis    history of hep c   History of CVA (cerebrovascular accident) 02/21/2014   History of delirium 04/12/2014   Hyperkalemia 11/13/2014   Hyperlipidemia    Hypertension    Hypotension 02/24/2014   Leukocytosis 02/26/2014   Myocardial infarction [facility])    Obesity    Paroxysmal atrial fibrillation (HCC)    Peripheral vascular disease (HCC)    Postoperative anemia due to acute blood loss 02/27/2014   Renal failure    Renal insufficiency    Stroke [facility]) 2011   Superficial incisional surgical site infection 03/14/2014   Syncope and collapse 07/25/2020    Past Surgical History:  Procedure  Laterality Date   A/V FISTULAGRAM Left 03/30/2019   Procedure: A/V FISTULAGRAM;  Surgeon: Annice Needy, MD;  Location: [facility] INV[facility] CV LAB;  Service: Cardiovascular;  Laterality: Left;   A/V FISTULAGRAM Left 10/06/2019   Procedure: A/V FISTULAGRAM;  Surgeon: Annice Needy, MD;  Location: [facility];  Service: Cardiovascular;  Laterality: Left;   A/V FISTULAGRAM Left 05/19/2021   Procedure: A/V FISTULAGRAM;  Surgeon: Annice Needy, MD;  Location: [facility];  Service: Cardiovascular;  Laterality: Left;   A/V FISTULAGRAM Left 01/29/2022   Procedure: A/V Fistulagram;  Surgeon: Annice Needy, MD;  Location: [facility];  Service: Cardiovascular;  Laterality: Left;   A/V FISTULAGRAM Left 04/30/2022   Procedure: A/V Fistulagram;  Surgeon: Annice Needy, MD;  Location: [facility];  Service: Cardiovascular;  Laterality: Left;   A/V FISTULAGRAM Left 08/20/2022   Procedure: A/V Fistulagram;  Surgeon: Annice Needy, MD;  Location: [facility];  Service: Cardiovascular;  Laterality: Left;   A/V FISTULAGRAM N/A 12/03/2022   Procedure: A/V Fistulagram;  Surgeon: Annice Needy, MD;  Location: [facility] LAB;  Service: Cardiovascular;  Laterality: N/A;   A/V FISTULAGRAM Left 02/11/2023   Procedure: A/V Fistulagram;  Surgeon: Annice Needy, MD;  Location: [facility]  INVASIVE CV LAB;  Service: Cardiovascular;  Laterality: Left;   COLONOSCOPY     COLONOSCOPY WITH PROPOFOL N/A 04/22/2021   Procedure: COLONOSCOPY WITH PROPOFOL;  Surgeon: Regis Bill, MD;  Location: [facility] ENDOSCOPY;  Service: Endoscopy;  Laterality: N/A;   CORONARY ANGIOPLASTY WITH STENT PLACEMENT  2013   CORONARY ARTERY BYPASS GRAFT     DIALYSIS FISTULA CREATION     ESOPHAGOGASTRODUODENOSCOPY     ESOPHAGOGASTRODUODENOSCOPY N/A 04/22/2021   Procedure: ESOPHAGOGASTRODUODENOSCOPY (EGD);  Surgeon: Regis Bill, MD;  Location: [facility] ENDOSCOPY;  Service: Endoscopy;  Laterality: N/A;   FLEXIBLE  SIGMOIDOSCOPY N/A 02/23/2019   Procedure: FLEXIBLE SIGMOIDOSCOPY;  Surgeon: Christena Deem, MD;  Location: [facility] ENDOSCOPY;  Service: Endoscopy;  Laterality: N/A;   LEFT HEART CATH AND CORS/GRAFTS ANGIOGRAPHY N/A 08/08/2018   Procedure: LEFT HEART CATH AND CORS/GRAFTS ANGIOGRAPHY;  Surgeon: Laurier Nancy, MD;  Location: [facility] INVASIVE CV LAB;  Service: Cardiovascular;  Laterality: N/A;   LEFT HEART CATH AND CORS/GRAFTS ANGIOGRAPHY N/A 01/30/2019   Procedure: LEFT HEART CATH AND CORS/GRAFTS ANGIOGRAPHY;  Surgeon: Laurier Nancy, MD;  Location: [facility] INVASIVE CV LAB;  Service: Cardiovascular;  Laterality: N/A;   percutaneous insertion intra aortic balloon right cath     ultrasound guided pericardiocentesis      Social History   Socioeconomic History   Marital status: Single    Spouse name: Not on file   Number of children: Not on file   Years of education: Not on file   Highest education level: Not on file  Occupational History   Not on file  Tobacco Use   Smoking status: Some Days    Current packs/day: 0.25    Average packs/day: 0.3 packs/day for 20.0 years (5.0 ttl pk-yrs)    Types: Cigarettes   Smokeless tobacco: Never   Tobacco comments:    smokes two cigarettes a day  Vaping Use   Vaping status: Never Used  Substance and Sexual Activity   Alcohol use: No   Drug use: No   Sexual activity: Not Currently    Birth control/protection: Post-menopausal  Other Topics Concern   Not on file  Social History Narrative   Not on file   Social Determinants of Health   Financial Resource Strain: Not on file  Food Insecurity: Not on file  Transportation Needs: Not on file  Physical Activity: Not on file  Stress: Not on file  Social Connections: Not on file  Intimate Partner Violence: Not on file    Family History  Problem Relation Age of Onset   Hypertension Mother    Ovarian cancer Mother    Diabetes type II Mother    Hypertension Father    Kidney disease Father     Hypertension Sister    Diabetes type II Maternal Grandmother    Breast cancer Maternal Grandmother    Breast cancer Maternal Aunt    Hypertension Other    Cancer Other    Renal Disease Other     Allergies  Allergen Reactions   Morphine And Codeine Hives   Levaquin [Levofloxacin] Itching    Severe itching; prickly sensation   Enalapril Other (See Comments)    hallucinations   Other    Latex Rash   Tape Rash    Review of Systems  Musculoskeletal:  Positive for joint pain (left foot, at site of bunion, right hand at site of previous fracture.).  All other systems reviewed and are negative.      Objective:   BP  137/62   Pulse (!) 59   Resp (!) 96   Ht 5\' 6"  (1.676 m)   Wt 158 lb (71.7 kg)   BMI 25.50 kg/m   Vitals:   02/25/23 1127  BP: 137/62  Pulse: (!) 59  Resp: (!) 96  Height: 5\' 6"  (1.676 m)  Weight: 158 lb (71.7 kg)  BMI (Calculated): 25.51    Physical Exam Vitals and nursing note reviewed.  Constitutional:      Appearance: Normal appearance. She is normal weight.  HENT:     Head: Normocephalic.  Eyes:     Extraocular Movements: Extraocular movements intact.     Conjunctiva/sclera: Conjunctivae normal.     Pupils: Pupils are equal, round, and reactive to light.  Cardiovascular:     Rate and Rhythm: Normal rate.  Pulmonary:     Effort: Pulmonary effort is normal.     Breath sounds: Normal breath sounds.  Musculoskeletal:     Left foot: Deformity and bunion present.       Feet:  Feet:     Right foot:     Skin integrity: Skin integrity normal.     Toenail Condition: Right toenails are abnormally thick and long.     Left foot:     Skin integrity: Skin integrity normal.     Toenail Condition: Left toenails are abnormally thick and long.  Neurological:     General: No focal deficit present.     Mental Status: She is alert and oriented to person, place, and time. Mental status is at baseline.  Psychiatric:        Mood and Affect: Mood normal.         Behavior: Behavior normal.        Thought Content: Thought content normal.        Judgment: Judgment normal.      No results found for any visits on 02/25/23.  Recent Results (from the past 2160 hour(s))  Potassium [facility] vascular lab only)     Status: None   Collection Time: 02/11/23 11:39 AM  Result Value Ref Range   Potassium [facility] vascular lab) 4.4 3.5 - 5.1 mmol/L    Comment: Performed at [facility], [address] [place], [place] [address]       Assessment & Plan:   Problem List Items Addressed This Visit       Active Problems   Pain of left foot - Primary    Suggested patient should use topicals to help manage her foot pain.  I gave her a sample of Salon Pas patches, which she applied prior to leaving office.  She says that this was extremely helpful with her pain, even before leaving she is starting to feel improvement.       Physical deconditioning    I think patient does need to continue her physical therapy.  She has been having continued trouble with the physical deconditioning.   No changes to plan of care.       Other Visit Diagnoses     Closed nondisplaced fracture of proximal phalanx of left little finger with routine healing, subsequent encounter       Suggested patient utilize topical meds where able, suggested salon pas patches.  She will let me know if this does not improve.       Return in about 6 weeks (around 04/08/2023).   Total time spent: 30 minutes  Miki Kins, FNP  02/25/2023   This document may have been  prepared by [employer] and as such may include unintentional dictation errors.

## 2023-03-27 NOTE — Assessment & Plan Note (Signed)
Suggested patient should use topicals to help manage her foot pain.  I gave her a sample of Salon Pas patches, which she applied prior to leaving office.  She says that this was extremely helpful with her pain, even before leaving she is starting to feel improvement.

## 2023-03-27 NOTE — Assessment & Plan Note (Signed)
I think patient does need to continue her physical therapy.  She has been having continued trouble with the physical deconditioning.   No changes to plan of care.

## 2023-03-28 LAB — AEROBIC CULTURE

## 2023-03-29 DIAGNOSIS — Z992 Dependence on renal dialysis: Secondary | ICD-10-CM | POA: Diagnosis not present

## 2023-03-29 DIAGNOSIS — E8779 Other fluid overload: Secondary | ICD-10-CM | POA: Diagnosis not present

## 2023-03-29 DIAGNOSIS — N2581 Secondary hyperparathyroidism of renal origin: Secondary | ICD-10-CM | POA: Diagnosis not present

## 2023-03-29 DIAGNOSIS — N186 End stage renal disease: Secondary | ICD-10-CM | POA: Diagnosis not present

## 2023-03-29 DIAGNOSIS — Z23 Encounter for immunization: Secondary | ICD-10-CM | POA: Diagnosis not present

## 2023-03-30 ENCOUNTER — Ambulatory Visit (INDEPENDENT_AMBULATORY_CARE_PROVIDER_SITE_OTHER): Payer: 59

## 2023-03-30 ENCOUNTER — Telehealth (INDEPENDENT_AMBULATORY_CARE_PROVIDER_SITE_OTHER): Payer: Self-pay

## 2023-03-30 DIAGNOSIS — I480 Paroxysmal atrial fibrillation: Secondary | ICD-10-CM

## 2023-03-30 DIAGNOSIS — I739 Peripheral vascular disease, unspecified: Secondary | ICD-10-CM

## 2023-03-30 DIAGNOSIS — I5020 Unspecified systolic (congestive) heart failure: Secondary | ICD-10-CM

## 2023-03-30 DIAGNOSIS — R0789 Other chest pain: Secondary | ICD-10-CM

## 2023-03-30 DIAGNOSIS — I351 Nonrheumatic aortic (valve) insufficiency: Secondary | ICD-10-CM

## 2023-03-30 DIAGNOSIS — E782 Mixed hyperlipidemia: Secondary | ICD-10-CM

## 2023-03-30 DIAGNOSIS — I361 Nonrheumatic tricuspid (valve) insufficiency: Secondary | ICD-10-CM | POA: Diagnosis not present

## 2023-03-30 DIAGNOSIS — I340 Nonrheumatic mitral (valve) insufficiency: Secondary | ICD-10-CM | POA: Diagnosis not present

## 2023-03-30 DIAGNOSIS — Z992 Dependence on renal dialysis: Secondary | ICD-10-CM

## 2023-03-30 NOTE — Telephone Encounter (Signed)
I spoke with the patient on 03/25/23 to schedule her for a permcath placement with Dr. Wyn Quaker. Pateint was offered 04/01/23 and 04/08/23 and declined both and and requested 04/15/23. Patient then left a message on 03/29/23 requesting to be scheduled on 04/08/23. Patient has been scheduled to 04/08/23 with a 12:00 pm arrival time to the [facility]. Pre-procedure instructions will be mailed.

## 2023-03-31 DIAGNOSIS — N2581 Secondary hyperparathyroidism of renal origin: Secondary | ICD-10-CM | POA: Diagnosis not present

## 2023-03-31 DIAGNOSIS — Z23 Encounter for immunization: Secondary | ICD-10-CM | POA: Diagnosis not present

## 2023-03-31 DIAGNOSIS — E8779 Other fluid overload: Secondary | ICD-10-CM | POA: Diagnosis not present

## 2023-03-31 DIAGNOSIS — N186 End stage renal disease: Secondary | ICD-10-CM | POA: Diagnosis not present

## 2023-03-31 DIAGNOSIS — Z992 Dependence on renal dialysis: Secondary | ICD-10-CM | POA: Diagnosis not present

## 2023-04-01 ENCOUNTER — Ambulatory Visit (INDEPENDENT_AMBULATORY_CARE_PROVIDER_SITE_OTHER): Payer: 59

## 2023-04-01 ENCOUNTER — Telehealth: Payer: Self-pay

## 2023-04-01 DIAGNOSIS — E782 Mixed hyperlipidemia: Secondary | ICD-10-CM

## 2023-04-01 DIAGNOSIS — I5020 Unspecified systolic (congestive) heart failure: Secondary | ICD-10-CM

## 2023-04-01 DIAGNOSIS — I480 Paroxysmal atrial fibrillation: Secondary | ICD-10-CM

## 2023-04-01 DIAGNOSIS — N186 End stage renal disease: Secondary | ICD-10-CM

## 2023-04-01 DIAGNOSIS — I739 Peripheral vascular disease, unspecified: Secondary | ICD-10-CM

## 2023-04-01 DIAGNOSIS — R0789 Other chest pain: Secondary | ICD-10-CM | POA: Diagnosis not present

## 2023-04-01 MED ORDER — TECHNETIUM TC 99M SESTAMIBI GENERIC - CARDIOLITE
32.3000 | Freq: Once | INTRAVENOUS | Status: AC | PRN
  Administered 2023-04-01: 32.3 via INTRAVENOUS

## 2023-04-01 MED ORDER — TECHNETIUM TC 99M SESTAMIBI GENERIC - CARDIOLITE
9.6000 | Freq: Once | INTRAVENOUS | Status: AC | PRN
  Administered 2023-04-01: 9.6 via INTRAVENOUS

## 2023-04-01 NOTE — H&P (View-Only) (Signed)
Subjective:    Patient ID: [name], female    DOB: 14-Sep-1960, 62 y.o.   MRN: [id number] Chief Complaint  Patient presents with   Follow-up    6 week follow up with HDA    The patient is a 62 year old female who presents today for follow-up evaluation of of her left upper extremity tremor graft extension.  She underwent intervention on 02/11/2023 with angioplasty of the mid upper arm access site.  Today noninvasive studies show improved flow volume with no areas of obvious stenosis identified however the patient continues to have significant pain in her fistula.  It is not just 1 area but the entire fistula itself.  It is very painful and sore with just the slightest of touch.  She also notes that she continues to have some bleeding from the area but is more of a dribble.  There is a small area that seems to have some serous fluid present.  Today her flow volume is 1742 whereas previously prior to intervention was 1093.    Review of Systems  Musculoskeletal:        Arm pain  Hematological:  Bruises/bleeds easily.  All other systems reviewed and are negative.      Objective:   Physical Exam Vitals reviewed.  HENT:     Head: Normocephalic.  Cardiovascular:     Rate and Rhythm: Normal rate.     Pulses: Normal pulses.          Radial pulses are 2+ on the left side.     Arteriovenous access: Left arteriovenous access is present.    Comments: Good thrill and bruit but extremely sore fistula Pulmonary:     Effort: Pulmonary effort is normal.  Skin:    General: Skin is warm and dry.  Neurological:     Mental Status: She is alert and oriented to person, place, and time.  Psychiatric:        Mood and Affect: Mood normal.        Behavior: Behavior normal.        Thought Content: Thought content normal.        Judgment: Judgment normal.     BP 110/70 (BP Location: Right Arm)   Pulse (!) 59   Resp 18   Ht 5\' 6"  (1.676 m)   Wt 160 lb (72.6 kg)   BMI 25.82 kg/m   Past  Medical History:  Diagnosis Date   Adult behavior problems    frontal lobe CVA   Anemia    chronic disease   Atrial flutter by electrocardiogram (HCC) 03/22/2014   Cardiomyopathy (HCC)    CHF (congestive heart failure) (HCC)    Chicken pox    Chronic respiratory failure with hypoxia (HCC) 12/19/2021   Coronary artery disease    Delayed surgical wound healing 04/12/2014   Overview:  Left leg   GERD (gastroesophageal reflux disease)    H/O atrial flutter 04/12/2014   H/O stroke without residual deficits 02/13/2014   H/O: substance abuse (HCC) 02/27/2014   Heart failure (HCC)    Hepatitis    history of hep c   History of CVA (cerebrovascular accident) 02/21/2014   History of delirium 04/12/2014   Hyperkalemia 11/13/2014   Hyperlipidemia    Hypertension    Hypotension 02/24/2014   Leukocytosis 02/26/2014   Myocardial infarction (HCC)    Obesity    Paroxysmal atrial fibrillation (HCC)    Peripheral vascular disease (HCC)    Postoperative anemia due to acute  blood loss 02/27/2014   Renal failure    Renal insufficiency    Stroke [facility]) 2011   Superficial incisional surgical site infection 03/14/2014   Syncope and collapse 07/25/2020    Social History   Socioeconomic History   Marital status: Single    Spouse name: Not on file   Number of children: Not on file   Years of education: Not on file   Highest education level: Not on file  Occupational History   Not on file  Tobacco Use   Smoking status: Some Days    Current packs/day: 0.25    Average packs/day: 0.3 packs/day for 20.0 years (5.0 ttl pk-yrs)    Types: Cigarettes   Smokeless tobacco: Never   Tobacco comments:    smokes two cigarettes a day  Vaping Use   Vaping status: Never Used  Substance and Sexual Activity   Alcohol use: No   Drug use: No   Sexual activity: Not Currently    Birth control/protection: Post-menopausal  Other Topics Concern   Not on file  Social History Narrative   Not on file    Social Determinants of Health   Financial Resource Strain: Not on file  Food Insecurity: Not on file  Transportation Needs: Not on file  Physical Activity: Not on file  Stress: Not on file  Social Connections: Not on file  Intimate Partner Violence: Not on file    Past Surgical History:  Procedure Laterality Date   A/V FISTULAGRAM Left 03/30/2019   Procedure: A/V FISTULAGRAM;  Surgeon: Annice Needy, MD;  Location: [facility];  Service: Cardiovascular;  Laterality: Left;   A/V FISTULAGRAM Left 10/06/2019   Procedure: A/V FISTULAGRAM;  Surgeon: Annice Needy, MD;  Location: [facility];  Service: Cardiovascular;  Laterality: Left;   A/V FISTULAGRAM Left 05/19/2021   Procedure: A/V FISTULAGRAM;  Surgeon: Annice Needy, MD;  Location: [facility];  Service: Cardiovascular;  Laterality: Left;   A/V FISTULAGRAM Left 01/29/2022   Procedure: A/V Fistulagram;  Surgeon: Annice Needy, MD;  Location: [facility];  Service: Cardiovascular;  Laterality: Left;   A/V FISTULAGRAM Left 04/30/2022   Procedure: A/V Fistulagram;  Surgeon: Annice Needy, MD;  Location: [facility];  Service: Cardiovascular;  Laterality: Left;   A/V FISTULAGRAM Left 08/20/2022   Procedure: A/V Fistulagram;  Surgeon: Annice Needy, MD;  Location: [facility];  Service: Cardiovascular;  Laterality: Left;   A/V FISTULAGRAM N/A 12/03/2022   Procedure: A/V Fistulagram;  Surgeon: Annice Needy, MD;  Location: [facility];  Service: Cardiovascular;  Laterality: N/A;   A/V FISTULAGRAM Left 02/11/2023   Procedure: A/V Fistulagram;  Surgeon: Annice Needy, MD;  Location: [facility];  Service: Cardiovascular;  Laterality: Left;   COLONOSCOPY     COLONOSCOPY WITH PROPOFOL N/A 04/22/2021   Procedure: COLONOSCOPY WITH PROPOFOL;  Surgeon: Regis Bill, MD;  Location: [facility];  Service: Endoscopy;  Laterality: N/A;   CORONARY ANGIOPLASTY WITH STENT PLACEMENT   2013   CORONARY ARTERY BYPASS GRAFT     DIALYSIS FISTULA CREATION     ESOPHAGOGASTRODUODENOSCOPY     ESOPHAGOGASTRODUODENOSCOPY N/A 04/22/2021   Procedure: ESOPHAGOGASTRODUODENOSCOPY (EGD);  Surgeon: Regis Bill, MD;  Location: [facility];  Service: Endoscopy;  Laterality: N/A;   FLEXIBLE SIGMOIDOSCOPY N/A 02/23/2019   Procedure: FLEXIBLE SIGMOIDOSCOPY;  Surgeon: Christena Deem, MD;  Location: [facility] ENDOSCOPY;  Service: Endoscopy;  Laterality: N/A;  LEFT HEART CATH AND CORS/GRAFTS ANGIOGRAPHY N/A 08/08/2018   Procedure: LEFT HEART CATH AND CORS/GRAFTS ANGIOGRAPHY;  Surgeon: Laurier Nancy, MD;  Location: [facility];  Service: Cardiovascular;  Laterality: N/A;   LEFT HEART CATH AND CORS/GRAFTS ANGIOGRAPHY N/A 01/30/2019   Procedure: LEFT HEART CATH AND CORS/GRAFTS ANGIOGRAPHY;  Surgeon: Laurier Nancy, MD;  Location: [facility];  Service: Cardiovascular;  Laterality: N/A;   percutaneous insertion intra aortic balloon right cath     ultrasound guided pericardiocentesis      Family History  Problem Relation Age of Onset   Hypertension Mother    Ovarian cancer Mother    Diabetes type II Mother    Hypertension Father    Kidney disease Father    Hypertension Sister    Diabetes type II Maternal Grandmother    Breast cancer Maternal Grandmother    Breast cancer Maternal Aunt    Hypertension Other    Cancer Other    Renal Disease Other     Allergies  Allergen Reactions   Morphine And Codeine Hives   Levaquin [Levofloxacin] Itching    Severe itching; prickly sensation   Enalapril Other (See Comments)    hallucinations   Other    Latex Rash   Tape Rash       Latest Ref Rng & Units 02/16/2022   11:55 PM 02/16/2022    6:42 PM 01/30/2022    9:56 AM  CBC  WBC 4.0 - 10.5 K/uL 6.8  6.5  3.8   Hemoglobin 12.0 - 15.0 g/dL 52.8  41.3  24.4   Hematocrit 36.0 - 46.0 % 34.5  39.9  32.8   Platelets 150 - 400 K/uL 140  146  127       CMP      Component Value Date/Time   NA 138 02/16/2022 2355   NA 139 07/09/2014 0503   K 5.2 (H) 02/16/2022 2355   K 4.7 07/11/2014 0958   CL 102 02/16/2022 2355   CL 98 07/09/2014 0503   CO2 22 02/16/2022 2355   CO2 30 07/09/2014 0503   GLUCOSE 70 02/16/2022 2355   GLUCOSE 107 (H) 07/09/2014 0503   BUN 43 (H) 02/16/2022 2355   BUN 39 (H) 07/09/2014 0503   CREATININE 8.45 (H) 02/16/2022 2355   CREATININE 5.53 (H) 07/09/2014 0503   CALCIUM 8.1 (L) 02/16/2022 2355   CALCIUM 8.3 (L) 07/09/2014 0503   PROT 6.7 02/16/2022 2215   PROT 8.4 (H) 07/07/2014 2207   ALBUMIN 3.5 02/16/2022 2355   ALBUMIN 3.1 (L) 07/07/2014 2207   AST 21 02/16/2022 2215   AST 76 (H) 07/07/2014 2207   ALT 10 02/16/2022 2215   ALT 41 07/07/2014 2207   ALKPHOS 63 02/16/2022 2215   ALKPHOS 107 07/07/2014 2207   BILITOT 1.0 02/16/2022 2215   BILITOT 0.7 07/07/2014 2207   GFRNONAA 5 (L) 02/16/2022 2355   GFRNONAA 9 (L) 07/09/2014 0503   GFRNONAA 9 (L) 02/11/2014 0300     No results found.     Assessment & Plan:   1. ESRD (end stage renal disease) (HCC) Currently my largest concern is that there may be an underlying infection.  We have cultured the area and this is come back without any evidence of significant infection.  Will still proceed to treat with doxycycline in the case of possible superficial cellulitis.  Alternatively the patient struck graft as many years old and certainly it may just be that it is an overuse  area and it needs replacement.  Will plan on having the patient get a PermCath in order to rest the area we will also have the patient undergo vein mapping of the right upper extremity to evaluate for possible new access.  2. Primary hypertension Continue antihypertensive medications as already ordered, these medications have been reviewed and there are no changes at this time.  3. Hyperlipidemia, unspecified hyperlipidemia type Continue statin as ordered and reviewed, no changes at this  time   Current Outpatient Medications on File Prior to Visit  Medication Sig Dispense Refill   albuterol (VENTOLIN HFA) 108 (90 Base) MCG/ACT inhaler Inhale 1-2 puffs into the lungs every 6 (six) hours as needed for wheezing or shortness of breath. 20.1 g 3   amiodarone (PACERONE) 200 MG tablet Take 200 mg by mouth daily.     apixaban (ELIQUIS) 5 MG TABS tablet Take 5 mg by mouth 2 (two) times daily.     Bismuth Tribromoph-Petrolatum (XEROFORM PETROLAT PATCH 2"X2") PADS Apply 1 each topically every 3 (three) days. 25 each 0   hydrOXYzine (ATARAX/VISTARIL) 50 MG tablet Take 50 mg by mouth at bedtime.     levothyroxine (SYNTHROID) 75 MCG tablet Take 75 mcg by mouth daily before breakfast.      lidocaine-prilocaine (EMLA) cream Apply 1 application topically daily as needed (port access).     midodrine (PROAMATINE) 10 MG tablet Take 1 tablet (10 mg total) by mouth 3 (three) times a week. Monday, Wednesday, Friday with hemodialysis     omeprazole (PRILOSEC) 20 MG capsule Take 20 mg by mouth daily.     oxyCODONE-acetaminophen (PERCOCET/ROXICET) 5-325 MG tablet Take 1 tablet by mouth every 6 (six) hours as needed for severe pain. 20 tablet 0   pantoprazole (PROTONIX) 40 MG tablet Take 40 mg by mouth daily.     ranolazine (RANEXA) 1000 MG SR tablet Take 1,000 mg by mouth 2 (two) times daily.     rosuvastatin (CRESTOR) 20 MG tablet Take 20 mg by mouth at bedtime.     sevelamer carbonate (RENVELA) 800 MG tablet Take 3,200 mg by mouth 3 (three) times daily with meals.     VELTASSA 8.4 g packet Take 1 packet by mouth daily.     colchicine 0.6 MG tablet Take 0.6 mg by mouth daily. (Patient not taking: Reported on 02/11/2023)     No current facility-administered medications on file prior to visit.    There are no Patient Instructions on file for this visit. No follow-ups on file.   Georgiana Spinner, NP

## 2023-04-01 NOTE — Progress Notes (Signed)
Subjective:    Patient ID: [name], female    DOB: 14-Sep-1960, 62 y.o.   MRN: [id number] Chief Complaint  Patient presents with   Follow-up    6 week follow up with HDA    The patient is a 62 year old female who presents today for follow-up evaluation of of her left upper extremity tremor graft extension.  She underwent intervention on 02/11/2023 with angioplasty of the mid upper arm access site.  Today noninvasive studies show improved flow volume with no areas of obvious stenosis identified however the patient continues to have significant pain in her fistula.  It is not just 1 area but the entire fistula itself.  It is very painful and sore with just the slightest of touch.  She also notes that she continues to have some bleeding from the area but is more of a dribble.  There is a small area that seems to have some serous fluid present.  Today her flow volume is 1742 whereas previously prior to intervention was 1093.    Review of Systems  Musculoskeletal:        Arm pain  Hematological:  Bruises/bleeds easily.  All other systems reviewed and are negative.      Objective:   Physical Exam Vitals reviewed.  HENT:     Head: Normocephalic.  Cardiovascular:     Rate and Rhythm: Normal rate.     Pulses: Normal pulses.          Radial pulses are 2+ on the left side.     Arteriovenous access: Left arteriovenous access is present.    Comments: Good thrill and bruit but extremely sore fistula Pulmonary:     Effort: Pulmonary effort is normal.  Skin:    General: Skin is warm and dry.  Neurological:     Mental Status: She is alert and oriented to person, place, and time.  Psychiatric:        Mood and Affect: Mood normal.        Behavior: Behavior normal.        Thought Content: Thought content normal.        Judgment: Judgment normal.     BP 110/70 (BP Location: Right Arm)   Pulse (!) 59   Resp 18   Ht 5\' 6"  (1.676 m)   Wt 160 lb (72.6 kg)   BMI 25.82 kg/m   Past  Medical History:  Diagnosis Date   Adult behavior problems    frontal lobe CVA   Anemia    chronic disease   Atrial flutter by electrocardiogram (HCC) 03/22/2014   Cardiomyopathy (HCC)    CHF (congestive heart failure) (HCC)    Chicken pox    Chronic respiratory failure with hypoxia (HCC) 12/19/2021   Coronary artery disease    Delayed surgical wound healing 04/12/2014   Overview:  Left leg   GERD (gastroesophageal reflux disease)    H/O atrial flutter 04/12/2014   H/O stroke without residual deficits 02/13/2014   H/O: substance abuse (HCC) 02/27/2014   Heart failure (HCC)    Hepatitis    history of hep c   History of CVA (cerebrovascular accident) 02/21/2014   History of delirium 04/12/2014   Hyperkalemia 11/13/2014   Hyperlipidemia    Hypertension    Hypotension 02/24/2014   Leukocytosis 02/26/2014   Myocardial infarction (HCC)    Obesity    Paroxysmal atrial fibrillation (HCC)    Peripheral vascular disease (HCC)    Postoperative anemia due to acute  blood loss 02/27/2014   Renal failure    Renal insufficiency    Stroke [facility]) 2011   Superficial incisional surgical site infection 03/14/2014   Syncope and collapse 07/25/2020    Social History   Socioeconomic History   Marital status: Single    Spouse name: Not on file   Number of children: Not on file   Years of education: Not on file   Highest education level: Not on file  Occupational History   Not on file  Tobacco Use   Smoking status: Some Days    Current packs/day: 0.25    Average packs/day: 0.3 packs/day for 20.0 years (5.0 ttl pk-yrs)    Types: Cigarettes   Smokeless tobacco: Never   Tobacco comments:    smokes two cigarettes a day  Vaping Use   Vaping status: Never Used  Substance and Sexual Activity   Alcohol use: No   Drug use: No   Sexual activity: Not Currently    Birth control/protection: Post-menopausal  Other Topics Concern   Not on file  Social History Narrative   Not on file    Social Determinants of Health   Financial Resource Strain: Not on file  Food Insecurity: Not on file  Transportation Needs: Not on file  Physical Activity: Not on file  Stress: Not on file  Social Connections: Not on file  Intimate Partner Violence: Not on file    Past Surgical History:  Procedure Laterality Date   A/V FISTULAGRAM Left 03/30/2019   Procedure: A/V FISTULAGRAM;  Surgeon: Annice Needy, MD;  Location: [facility];  Service: Cardiovascular;  Laterality: Left;   A/V FISTULAGRAM Left 10/06/2019   Procedure: A/V FISTULAGRAM;  Surgeon: Annice Needy, MD;  Location: [facility];  Service: Cardiovascular;  Laterality: Left;   A/V FISTULAGRAM Left 05/19/2021   Procedure: A/V FISTULAGRAM;  Surgeon: Annice Needy, MD;  Location: [facility];  Service: Cardiovascular;  Laterality: Left;   A/V FISTULAGRAM Left 01/29/2022   Procedure: A/V Fistulagram;  Surgeon: Annice Needy, MD;  Location: [facility];  Service: Cardiovascular;  Laterality: Left;   A/V FISTULAGRAM Left 04/30/2022   Procedure: A/V Fistulagram;  Surgeon: Annice Needy, MD;  Location: [facility];  Service: Cardiovascular;  Laterality: Left;   A/V FISTULAGRAM Left 08/20/2022   Procedure: A/V Fistulagram;  Surgeon: Annice Needy, MD;  Location: [facility];  Service: Cardiovascular;  Laterality: Left;   A/V FISTULAGRAM N/A 12/03/2022   Procedure: A/V Fistulagram;  Surgeon: Annice Needy, MD;  Location: [facility];  Service: Cardiovascular;  Laterality: N/A;   A/V FISTULAGRAM Left 02/11/2023   Procedure: A/V Fistulagram;  Surgeon: Annice Needy, MD;  Location: [facility];  Service: Cardiovascular;  Laterality: Left;   COLONOSCOPY     COLONOSCOPY WITH PROPOFOL N/A 04/22/2021   Procedure: COLONOSCOPY WITH PROPOFOL;  Surgeon: Regis Bill, MD;  Location: [facility];  Service: Endoscopy;  Laterality: N/A;   CORONARY ANGIOPLASTY WITH STENT PLACEMENT   2013   CORONARY ARTERY BYPASS GRAFT     DIALYSIS FISTULA CREATION     ESOPHAGOGASTRODUODENOSCOPY     ESOPHAGOGASTRODUODENOSCOPY N/A 04/22/2021   Procedure: ESOPHAGOGASTRODUODENOSCOPY (EGD);  Surgeon: Regis Bill, MD;  Location: [facility] ENDOSCOPY;  Service: Endoscopy;  Laterality: N/A;   FLEXIBLE SIGMOIDOSCOPY N/A 02/23/2019   Procedure: FLEXIBLE SIGMOIDOSCOPY;  Surgeon: Christena Deem, MD;  Location: [facility] ENDOSCOPY;  Service: Endoscopy;  Laterality: N/A;  LEFT HEART CATH AND CORS/GRAFTS ANGIOGRAPHY N/A 08/08/2018   Procedure: LEFT HEART CATH AND CORS/GRAFTS ANGIOGRAPHY;  Surgeon: Laurier Nancy, MD;  Location: [facility];  Service: Cardiovascular;  Laterality: N/A;   LEFT HEART CATH AND CORS/GRAFTS ANGIOGRAPHY N/A 01/30/2019   Procedure: LEFT HEART CATH AND CORS/GRAFTS ANGIOGRAPHY;  Surgeon: Laurier Nancy, MD;  Location: [facility];  Service: Cardiovascular;  Laterality: N/A;   percutaneous insertion intra aortic balloon right cath     ultrasound guided pericardiocentesis      Family History  Problem Relation Age of Onset   Hypertension Mother    Ovarian cancer Mother    Diabetes type II Mother    Hypertension Father    Kidney disease Father    Hypertension Sister    Diabetes type II Maternal Grandmother    Breast cancer Maternal Grandmother    Breast cancer Maternal Aunt    Hypertension Other    Cancer Other    Renal Disease Other     Allergies  Allergen Reactions   Morphine And Codeine Hives   Levaquin [Levofloxacin] Itching    Severe itching; prickly sensation   Enalapril Other (See Comments)    hallucinations   Other    Latex Rash   Tape Rash       Latest Ref Rng & Units 02/16/2022   11:55 PM 02/16/2022    6:42 PM 01/30/2022    9:56 AM  CBC  WBC 4.0 - 10.5 K/uL 6.8  6.5  3.8   Hemoglobin 12.0 - 15.0 g/dL 52.8  41.3  24.4   Hematocrit 36.0 - 46.0 % 34.5  39.9  32.8   Platelets 150 - 400 K/uL 140  146  127       CMP      Component Value Date/Time   NA 138 02/16/2022 2355   NA 139 07/09/2014 0503   K 5.2 (H) 02/16/2022 2355   K 4.7 07/11/2014 0958   CL 102 02/16/2022 2355   CL 98 07/09/2014 0503   CO2 22 02/16/2022 2355   CO2 30 07/09/2014 0503   GLUCOSE 70 02/16/2022 2355   GLUCOSE 107 (H) 07/09/2014 0503   BUN 43 (H) 02/16/2022 2355   BUN 39 (H) 07/09/2014 0503   CREATININE 8.45 (H) 02/16/2022 2355   CREATININE 5.53 (H) 07/09/2014 0503   CALCIUM 8.1 (L) 02/16/2022 2355   CALCIUM 8.3 (L) 07/09/2014 0503   PROT 6.7 02/16/2022 2215   PROT 8.4 (H) 07/07/2014 2207   ALBUMIN 3.5 02/16/2022 2355   ALBUMIN 3.1 (L) 07/07/2014 2207   AST 21 02/16/2022 2215   AST 76 (H) 07/07/2014 2207   ALT 10 02/16/2022 2215   ALT 41 07/07/2014 2207   ALKPHOS 63 02/16/2022 2215   ALKPHOS 107 07/07/2014 2207   BILITOT 1.0 02/16/2022 2215   BILITOT 0.7 07/07/2014 2207   GFRNONAA 5 (L) 02/16/2022 2355   GFRNONAA 9 (L) 07/09/2014 0503   GFRNONAA 9 (L) 02/11/2014 0300     No results found.     Assessment & Plan:   1. ESRD (end stage renal disease) (HCC) Currently my largest concern is that there may be an underlying infection.  We have cultured the area and this is come back without any evidence of significant infection.  Will still proceed to treat with doxycycline in the case of possible superficial cellulitis.  Alternatively the patient struck graft as many years old and certainly it may just be that it is an overuse  area and it needs replacement.  Will plan on having the patient get a PermCath in order to rest the area we will also have the patient undergo vein mapping of the right upper extremity to evaluate for possible new access.  2. Primary hypertension Continue antihypertensive medications as already ordered, these medications have been reviewed and there are no changes at this time.  3. Hyperlipidemia, unspecified hyperlipidemia type Continue statin as ordered and reviewed, no changes at this  time   Current Outpatient Medications on File Prior to Visit  Medication Sig Dispense Refill   albuterol (VENTOLIN HFA) 108 (90 Base) MCG/ACT inhaler Inhale 1-2 puffs into the lungs every 6 (six) hours as needed for wheezing or shortness of breath. 20.1 g 3   amiodarone (PACERONE) 200 MG tablet Take 200 mg by mouth daily.     apixaban (ELIQUIS) 5 MG TABS tablet Take 5 mg by mouth 2 (two) times daily.     Bismuth Tribromoph-Petrolatum (XEROFORM PETROLAT PATCH 2"X2") PADS Apply 1 each topically every 3 (three) days. 25 each 0   hydrOXYzine (ATARAX/VISTARIL) 50 MG tablet Take 50 mg by mouth at bedtime.     levothyroxine (SYNTHROID) 75 MCG tablet Take 75 mcg by mouth daily before breakfast.      lidocaine-prilocaine (EMLA) cream Apply 1 application topically daily as needed (port access).     midodrine (PROAMATINE) 10 MG tablet Take 1 tablet (10 mg total) by mouth 3 (three) times a week. Monday, Wednesday, Friday with hemodialysis     omeprazole (PRILOSEC) 20 MG capsule Take 20 mg by mouth daily.     oxyCODONE-acetaminophen (PERCOCET/ROXICET) 5-325 MG tablet Take 1 tablet by mouth every 6 (six) hours as needed for severe pain. 20 tablet 0   pantoprazole (PROTONIX) 40 MG tablet Take 40 mg by mouth daily.     ranolazine (RANEXA) 1000 MG SR tablet Take 1,000 mg by mouth 2 (two) times daily.     rosuvastatin (CRESTOR) 20 MG tablet Take 20 mg by mouth at bedtime.     sevelamer carbonate (RENVELA) 800 MG tablet Take 3,200 mg by mouth 3 (three) times daily with meals.     VELTASSA 8.4 g packet Take 1 packet by mouth daily.     colchicine 0.6 MG tablet Take 0.6 mg by mouth daily. (Patient not taking: Reported on 02/11/2023)     No current facility-administered medications on file prior to visit.    There are no Patient Instructions on file for this visit. No follow-ups on file.   Georgiana Spinner, NP

## 2023-04-01 NOTE — Patient Outreach (Signed)
[facility] Assistant attempted to call patient on today regarding preventative mammogram screening. No answer from patient after multiple rings. Assistant left confidential voicemail for patient to return call.  Will call back patient back for final attempt.  Baruch Gouty [facility] Assistant [facility] [phone]

## 2023-04-02 ENCOUNTER — Other Ambulatory Visit: Payer: Self-pay | Admitting: Internal Medicine

## 2023-04-02 DIAGNOSIS — N186 End stage renal disease: Secondary | ICD-10-CM | POA: Diagnosis not present

## 2023-04-02 DIAGNOSIS — Z992 Dependence on renal dialysis: Secondary | ICD-10-CM | POA: Diagnosis not present

## 2023-04-02 DIAGNOSIS — R188 Other ascites: Secondary | ICD-10-CM

## 2023-04-02 DIAGNOSIS — E8779 Other fluid overload: Secondary | ICD-10-CM | POA: Diagnosis not present

## 2023-04-02 DIAGNOSIS — N2581 Secondary hyperparathyroidism of renal origin: Secondary | ICD-10-CM | POA: Diagnosis not present

## 2023-04-02 DIAGNOSIS — Z23 Encounter for immunization: Secondary | ICD-10-CM | POA: Diagnosis not present

## 2023-04-05 DIAGNOSIS — Z23 Encounter for immunization: Secondary | ICD-10-CM | POA: Diagnosis not present

## 2023-04-05 DIAGNOSIS — Z992 Dependence on renal dialysis: Secondary | ICD-10-CM | POA: Diagnosis not present

## 2023-04-05 DIAGNOSIS — E8779 Other fluid overload: Secondary | ICD-10-CM | POA: Diagnosis not present

## 2023-04-05 DIAGNOSIS — N2581 Secondary hyperparathyroidism of renal origin: Secondary | ICD-10-CM | POA: Diagnosis not present

## 2023-04-05 DIAGNOSIS — N186 End stage renal disease: Secondary | ICD-10-CM | POA: Diagnosis not present

## 2023-04-06 ENCOUNTER — Ambulatory Visit (INDEPENDENT_AMBULATORY_CARE_PROVIDER_SITE_OTHER): Payer: 59 | Admitting: Family

## 2023-04-06 VITALS — BP 110/60 | Ht 66.0 in | Wt 164.0 lb

## 2023-04-06 DIAGNOSIS — N186 End stage renal disease: Secondary | ICD-10-CM

## 2023-04-06 DIAGNOSIS — K7469 Other cirrhosis of liver: Secondary | ICD-10-CM | POA: Diagnosis not present

## 2023-04-06 DIAGNOSIS — D696 Thrombocytopenia, unspecified: Secondary | ICD-10-CM

## 2023-04-06 DIAGNOSIS — Z992 Dependence on renal dialysis: Secondary | ICD-10-CM

## 2023-04-06 MED ORDER — OXYCODONE-ACETAMINOPHEN 5-325 MG PO TABS: 1.0000 | ORAL_TABLET | Freq: Four times a day (QID) | ORAL | 0 refills | Status: DC | PRN

## 2023-04-06 NOTE — Progress Notes (Signed)
Established Patient Office Visit  Subjective:  Patient ID: [name], female    DOB: Dec 02, 1960  Age: 61 y.o. MRN: [id number]  Chief Complaint  Patient presents with   Follow-up    Patient is here today for her 3 months follow up.  She has been feeling poorly since last appointment.   She does have additional concerns to discuss today.  Her fistula has become infected, and she is having surgery soon to get new dialysis access so that she can continue her treatments.   Labs are done at her dialysis facility, so we will pull the results from there as we can. She needs refills.   I have reviewed her active problem list, medication list, allergies, health maintenance, notes from last encounter, lab results for her appointment today.      No other concerns at this time.   Past Medical History:  Diagnosis Date   Adult behavior problems    frontal lobe CVA   Anemia    chronic disease   Atrial flutter by electrocardiogram (HCC) 03/22/2014   Cardiomyopathy (HCC)    CHF (congestive heart failure) (HCC)    Chicken pox    Chronic respiratory failure with hypoxia (HCC) 12/19/2021   Coronary artery disease    Delayed surgical wound healing 04/12/2014   Overview:  Left leg   GERD (gastroesophageal reflux disease)    H/O atrial flutter 04/12/2014   H/O stroke without residual deficits 02/13/2014   H/O: substance abuse (HCC) 02/27/2014   Heart failure (HCC)    Hepatitis    history of hep c   History of CVA (cerebrovascular accident) 02/21/2014   History of delirium 04/12/2014   Hyperkalemia 11/13/2014   Hyperlipidemia    Hypertension    Hypotension 02/24/2014   Leukocytosis 02/26/2014   Myocardial infarction [facility])    Obesity    Paroxysmal atrial fibrillation (HCC)    Peripheral vascular disease (HCC)    Postoperative anemia due to acute blood loss 02/27/2014   Renal failure    Renal insufficiency    Stroke [facility]) 2011   Superficial incisional surgical site  infection 03/14/2014   Syncope and collapse 07/25/2020    Past Surgical History:  Procedure Laterality Date   A/V FISTULAGRAM Left 03/30/2019   Procedure: A/V FISTULAGRAM;  Surgeon: Annice Needy, MD;  Location: [facility] INVASIVE CV LAB;  Service: Cardiovascular;  Laterality: Left;   A/V FISTULAGRAM Left 10/06/2019   Procedure: A/V FISTULAGRAM;  Surgeon: Annice Needy, MD;  Location: [facility] INVASIVE CV LAB;  Service: Cardiovascular;  Laterality: Left;   A/V FISTULAGRAM Left 05/19/2021   Procedure: A/V FISTULAGRAM;  Surgeon: Annice Needy, MD;  Location: [facility] INVASIVE CV LAB;  Service: Cardiovascular;  Laterality: Left;   A/V FISTULAGRAM Left 01/29/2022   Procedure: A/V Fistulagram;  Surgeon: Annice Needy, MD;  Location: [facility] INVASIVE CV LAB;  Service: Cardiovascular;  Laterality: Left;   A/V FISTULAGRAM Left 04/30/2022   Procedure: A/V Fistulagram;  Surgeon: Annice Needy, MD;  Location: [facility] INV[facility]IVE CV [facility];  Service: Cardiovascular;  Laterality: Left;   A/V FISTULAGRAM Left 08/20/2022   Procedure: A/V Fistulagram;  Surgeon: Annice Needy, MD;  Location: [facility] INV[facility]IVE CV LAB;  Service: Cardiovascular;  Laterality: Left;   A/V FISTULAGRAM N/A 12/03/2022   Procedure: A/V Fistulagram;  Surgeon: Annice Needy, MD;  Location: [facility] INVASIVE CV LAB;  Service: Cardiovascular;  Laterality: N/A;   A/V FISTULAGRAM Left 02/11/2023   Procedure: A/V Fistulagram;  Surgeon: Festus Barren  S, MD;  Location: [facility] INVASIVE CV LAB;  Service: Cardiovascular;  Laterality: Left;   COLONOSCOPY     COLONOSCOPY WITH PROPOFOL N/A 04/22/2021   Procedure: COLONOSCOPY WITH PROPOFOL;  Surgeon: Regis Bill, MD;  Location: [facility] ENDOSCOPY;  Service: Endoscopy;  Laterality: N/A;   CORONARY ANGIOPLASTY WITH STENT PLACEMENT  2013   CORONARY ARTERY BYPASS GRAFT     DIALYSIS FISTULA CREATION     DIALYSIS/PERMA CATHETER INSERTION N/A 04/08/2023   Procedure: DIALYSIS/PERMA CATHETER INSERTION;  Surgeon: Annice Needy, MD;  Location: [facility]  INVASIVE CV LAB;  Service: Cardiovascular;  Laterality: N/A;   ESOPHAGOGASTRODUODENOSCOPY     ESOPHAGOGASTRODUODENOSCOPY N/A 04/22/2021   Procedure: ESOPHAGOGASTRODUODENOSCOPY (EGD);  Surgeon: Regis Bill, MD;  Location: [facility] ENDOSCOPY;  Service: Endoscopy;  Laterality: N/A;   FLEXIBLE SIGMOIDOSCOPY N/A 02/23/2019   Procedure: FLEXIBLE SIGMOIDOSCOPY;  Surgeon: Christena Deem, MD;  Location: [facility] ENDOSCOPY;  Service: Endoscopy;  Laterality: N/A;   LEFT HEART CATH AND CORS/GRAFTS ANGIOGRAPHY N/A 08/08/2018   Procedure: LEFT HEART CATH AND CORS/GRAFTS ANGIOGRAPHY;  Surgeon: Laurier Nancy, MD;  Location: [facility] INVASIVE CV LAB;  Service: Cardiovascular;  Laterality: N/A;   LEFT HEART CATH AND CORS/GRAFTS ANGIOGRAPHY N/A 01/30/2019   Procedure: LEFT HEART CATH AND CORS/GRAFTS ANGIOGRAPHY;  Surgeon: Laurier Nancy, MD;  Location: [facility] INVASIVE CV LAB;  Service: Cardiovascular;  Laterality: N/A;   percutaneous insertion intra aortic balloon right cath     ultrasound guided pericardiocentesis      Social History   Socioeconomic History   Marital status: Single    Spouse name: Not on file   Number of children: Not on file   Years of education: Not on file   Highest education level: Not on file  Occupational History   Not on file  Tobacco Use   Smoking status: Some Days    Current packs/day: 0.25    Average packs/day: 0.3 packs/day for 20.0 years (5.0 ttl pk-yrs)    Types: Cigarettes   Smokeless tobacco: Never   Tobacco comments:    smokes two cigarettes a day  Vaping Use   Vaping status: Never Used  Substance and Sexual Activity   Alcohol use: No   Drug use: No   Sexual activity: Not Currently    Birth control/protection: Post-menopausal  Other Topics Concern   Not on file  Social History Narrative   Not on file   Social Drivers of Health   Financial Resource Strain: Not on file  Food Insecurity: No Food Insecurity (05/18/2023)   Hunger Vital Sign    Worried About  Running Out of Food in the Last Year: Never true    Ran Out of Food in the Last Year: Never true  Transportation Needs: No Transportation Needs (05/18/2023)   PRAPARE - Administrator, Civil Service (Medical): No    Lack of Transportation (Non-Medical): No  Physical Activity: Not on file  Stress: Not on file  Social Connections: Not on file  Intimate Partner Violence: Not At Risk (05/18/2023)   Humiliation, Afraid, Rape, and Kick questionnaire    Fear of Current or Ex-Partner: No    Emotionally Abused: No    Physically Abused: No    Sexually Abused: No    Family History  Problem Relation Age of Onset   Hypertension Mother    Ovarian cancer Mother    Diabetes type II Mother    Hypertension Father    Kidney disease Father    Hypertension Sister  Diabetes type II Maternal Grandmother    Breast cancer Maternal Grandmother    Breast cancer Maternal Aunt    Hypertension Other    Cancer Other    Renal Disease Other     Allergies  Allergen Reactions   Morphine And Codeine Hives   Levaquin [Levofloxacin] Itching    Severe itching; prickly sensation   Enalapril Other (See Comments)    hallucinations   Latex Rash   Tape Rash and Other (See Comments)    Review of Systems  All other systems reviewed and are negative.      Objective:   BP 110/60   Ht 5\' 6"  (1.676 m)   Wt 164 lb (74.4 kg)   BMI 26.47 kg/m   Vitals:   04/06/23 1316  BP: 110/60  Height: 5\' 6"  (1.676 m)  Weight: 164 lb (74.4 kg)  BMI (Calculated): 26.48    Physical Exam Vitals and nursing note reviewed.  Constitutional:      General: She is not in acute distress.    Appearance: Normal appearance. She is normal weight. She is ill-appearing.  HENT:     Head: Normocephalic and atraumatic.  Eyes:     Extraocular Movements: Extraocular movements intact.     Conjunctiva/sclera: Conjunctivae normal.     Pupils: Pupils are equal, round, and reactive to light.  Cardiovascular:     Rate  and Rhythm: Normal rate.  Pulmonary:     Effort: Pulmonary effort is normal.  Neurological:     General: No focal deficit present.     Mental Status: She is alert and oriented to person, place, and time. Mental status is at baseline.  Psychiatric:        Mood and Affect: Mood normal.        Behavior: Behavior normal.        Thought Content: Thought content normal.        Judgment: Judgment normal.      No results found for any visits on 04/06/23.  Recent Results (from the past 2160 hours)  Aerobic culture     Status: None   Collection Time: 03/25/23 10:27 AM  Result Value Ref Range   Aerobic Bacterial Culture Final report    Organism ID, Bacteria Mixed skin flora     Comment: Scant growth  Potassium [facility] vascular lab only)     Status: Abnormal   Collection Time: 04/08/23  1:02 PM  Result Value Ref Range   Potassium [facility] vascular lab) 5.3 (H) 3.5 - 5.1 mmol/L    Comment: Performed at [facility], [address] [place], [place] [address]  CBC with Differential     Status: Abnormal   Collection Time: 05/17/23  7:47 AM  Result Value Ref Range   WBC 4.9 4.0 - 10.5 K/uL   RBC 3.72 (L) 3.87 - 5.11 MIL/uL   Hemoglobin 11.7 (L) 12.0 - 15.0 g/dL   HCT 57.8 46.9 - 62.9 %   MCV 99.7 80.0 - 100.0 fL   MCH 31.5 26.0 - 34.0 pg   MCHC 31.5 30.0 - 36.0 g/dL   RDW 52.8 41.3 - 24.4 %   Platelets 113 (L) 150 - 400 K/uL   nRBC 0.0 0.0 - 0.2 %   Neutrophils Relative % 63 %   Neutro Abs 3.1 1.7 - 7.7 K/uL   Lymphocytes Relative 22 %   Lymphs Abs 1.1 0.7 - 4.0 K/uL   Monocytes Relative 11 %   Monocytes Absolute 0.5 0.1 - 1.0  K/uL   Eosinophils Relative 3 %   Eosinophils Absolute 0.1 0.0 - 0.5 K/uL   Basophils Relative 0 %   Basophils Absolute 0.0 0.0 - 0.1 K/uL   Immature Granulocytes 1 %   Abs Immature Granulocytes 0.03 0.00 - 0.07 K/uL    Comment: Performed at [facility], [address] [place], [place] [address]  Comprehensive metabolic panel     Status:  Abnormal   Collection Time: 05/17/23  7:47 AM  Result Value Ref Range   Sodium 137 135 - 145 mmol/L   Potassium 5.0 3.5 - 5.1 mmol/L   Chloride 100 98 - 111 mmol/L   CO2 22 22 - 32 mmol/L   Glucose, Bld 102 (H) 70 - 99 mg/dL    Comment: Glucose reference range applies only to samples taken after fasting for at least 8 hours.   BUN 64 (H) 6 - 20 mg/dL   Creatinine, Ser 1.91 (H) 0.44 - 1.00 mg/dL   Calcium 9.6 8.9 - 47.8 mg/dL   Total Protein 7.7 6.5 - 8.1 g/dL   Albumin 3.6 3.5 - 5.0 g/dL   AST 24 15 - 41 U/L   ALT 16 0 - 44 U/L   Alkaline Phosphatase 105 38 - 126 U/L   Total Bilirubin 0.8 <1.2 mg/dL   GFR, Estimated 5 (L) >60 mL/min    Comment: (NOTE) Calculated using the CKD-EPI Creatinine Equation (2021)    Anion gap 15 5 - 15    Comment: Performed at [facility], [address], [place], [place] [address]  Troponin I (High Sensitivity)     Status: Abnormal   Collection Time: 05/17/23  7:47 AM  Result Value Ref Range   Troponin I (High Sensitivity) 75 (H) <18 ng/L    Comment: (NOTE) Elevated high sensitivity troponin I (hsTnI) values and significant  changes across serial measurements may suggest ACS but many other  chronic and acute conditions are known to elevate hsTnI results.  Refer to the "Links" section for chest pain algorithms and additional  guidance. Performed at [facility], [address] [place], [place] [address]   Heparin level (unfractionated)     Status: Abnormal   Collection Time: 05/17/23  7:47 AM  Result Value Ref Range   Heparin Unfractionated >1.10 (H) 0.30 - 0.70 IU/mL    Comment: (NOTE) The clinical reportable range upper limit is being lowered to >1.10 to align with the FDA approved guidance for the current laboratory assay.  If heparin results are below expected values, and patient dosage has  been confirmed, suggest follow up testing of antithrombin III levels. Performed at [facility], [address] [place], [place] [address]   APTT     Status: None   Collection Time: 05/17/23  7:47 AM  Result Value Ref Range   aPTT 30 24 - 36 seconds    Comment: Performed at [facility], [address] [place], [place] [address]  Protime-INR     Status: Abnormal   Collection Time: 05/17/23  7:47 AM  Result Value Ref Range   Prothrombin Time 18.9 (H) 11.4 - 15.2 seconds   INR 1.6 (H) 0.8 - 1.2    Comment: (NOTE) INR goal varies based on device and disease states. Performed at [facility], [address] [place], [place] [address]   Lactic acid, plasma     Status: None   Collection Time: 05/17/23  8:54 AM  Result Value Ref Range  Lactic Acid, Venous 0.9 0.5 - 1.9 mmol/L    Comment: Performed at [facility], [address] [place], [place] [address]  Troponin I (High Sensitivity)     Status: Abnormal   Collection Time: 05/17/23 10:48 AM  Result Value Ref Range   Troponin I (High Sensitivity) 68 (H) <18 ng/L    Comment: (NOTE) Elevated high sensitivity troponin I (hsTnI) values and significant  changes across serial measurements may suggest ACS but many other  chronic and acute conditions are known to elevate hsTnI results.  Refer to the "Links" section for chest pain algorithms and additional  guidance. Performed at [facility], [address] [place], [place] [address]   ECHOCARDIOGRAM COMPLETE     Status: None   Collection Time: 05/17/23  1:58 PM  Result Value Ref Range   Weight 2,592 oz   Height 66 in   BP 103/76 mmHg   Ao pk vel 1.56 m/s   AV Area VTI 2.21 cm2   AR max vel 2.10 cm2   AV Mean grad 4.5 mmHg   AV Peak grad 9.7 mmHg   Single Plane A2C EF 30.9 %   Single Plane A4C EF 25.1 %   Calc EF 24.5 %   S' Lateral 5.80 cm   AV Area mean vel 1.91 cm2   Area-P 1/2 6.17 cm2   MV VTI 1.75 cm2   Est EF 25 - 30%   Hepatitis B surface antigen     Status: None   Collection Time: 05/17/23  2:50 PM  Result Value Ref Range    Hepatitis B Surface Ag NON REACTIVE NON REACTIVE    Comment: Performed at [facility], [address], [place], [place] [address]  Hepatitis B surface antibody,quantitative     Status: Abnormal   Collection Time: 05/17/23  2:50 PM  Result Value Ref Range   Hep B S AB Quant (Post) <3.5 (L) Immunity>10 mIU/mL    Comment: (NOTE)  Status of Immunity                     Anti-HBs Level  ------------------                     -------------- Inconsistent with Immunity                  0.0 - 10.0 Consistent with Immunity                         >10.0 Performed At: [facility] [address] [place], [place] [id number] Jolene Schimke MD OZ:[id number]   APTT     Status: Abnormal   Collection Time: 05/17/23  9:33 PM  Result Value Ref Range   aPTT 62 (H) 24 - 36 seconds    Comment:        IF BASELINE aPTT IS ELEVATED, SUGGEST PATIENT RISK ASSESSMENT BE USED TO DETERMINE APPROPRIATE ANTICOAGULANT THERAPY. Performed at [facility], [address] [place], [place] [address]   MRSA Next Gen by PCR, Nasal     Status: Abnormal   Collection Time: 05/18/23  2:57 AM   Specimen: Nasal Mucosa; Nasal Swab  Result Value Ref Range   MRSA by PCR Next Gen DETECTED (A) NOT DETECTED    Comment: RESULT CALLED TO, READ BACK BY AND VERIFIED WITH:  MARCEL TURNER AT 0506 05/18/23 JG (NOTE) The GeneXpert MRSA Assay (FDA approved for NASAL  specimens only), is one component of a comprehensive MRSA colonization surveillance program. It is not intended to diagnose MRSA infection nor to guide or monitor treatment for MRSA infections. Test performance is not FDA approved in patients less than 25 years old. Performed at [facility], [address] [place], [place] [address]   Lipoprotein A (LPA)     Status: Abnormal   Collection Time: 05/18/23  6:01 AM  Result Value Ref Range   Lipoprotein (a) 96.8 (H) <75.0 nmol/L    Comment: (NOTE) Note:  Values greater than or equal to  75.0 nmol/L may       indicate an independent risk factor for CHD,       but must be evaluated with caution when applied       to non-Caucasian populations due to the       influence of genetic factors on Lp(a) across       ethnicities. Performed At: [facility] [address] [place], [place] [id number] Jolene Schimke MD EX:[id number]   CBC     Status: Abnormal   Collection Time: 05/18/23  6:01 AM  Result Value Ref Range   WBC 4.5 4.0 - 10.5 K/uL   RBC 3.39 (L) 3.87 - 5.11 MIL/uL   Hemoglobin 10.7 (L) 12.0 - 15.0 g/dL   HCT 10.2 (L) 72.5 - 36.6 %   MCV 96.8 80.0 - 100.0 fL   MCH 31.6 26.0 - 34.0 pg   MCHC 32.6 30.0 - 36.0 g/dL   RDW 44.0 34.7 - 42.5 %   Platelets 116 (L) 150 - 400 K/uL   nRBC 0.0 0.0 - 0.2 %    Comment: Performed at [facility], [address] [place], [place] [address]  APTT     Status: Abnormal   Collection Time: 05/18/23  6:01 AM  Result Value Ref Range   aPTT 90 (H) 24 - 36 seconds    Comment:        IF BASELINE aPTT IS ELEVATED, SUGGEST PATIENT RISK ASSESSMENT BE USED TO DETERMINE APPROPRIATE ANTICOAGULANT THERAPY. Performed at [facility], [address] [place], [place] [address]   Heparin level (unfractionated)     Status: Abnormal   Collection Time: 05/18/23  6:01 AM  Result Value Ref Range   Heparin Unfractionated 0.95 (H) 0.30 - 0.70 IU/mL    Comment: (NOTE) The clinical reportable range upper limit is being lowered to >1.10 to align with the FDA approved guidance for the current laboratory assay.  If heparin results are below expected values, and patient dosage has  been confirmed, suggest follow up testing of antithrombin III levels. Performed at [facility], [address] [place], [place] [address]   HIV Antibody (routine testing w rflx)     Status: None   Collection Time: 05/18/23  6:01 AM  Result Value Ref Range   HIV Screen 4th Generation wRfx Non Reactive Non Reactive    Comment:  Performed at [facility], [address], [place], [place] [address]  CBC     Status: Abnormal   Collection Time: 05/18/23  2:24 PM  Result Value Ref Range   WBC 4.2 4.0 - 10.5 K/uL   RBC 3.29 (L) 3.87 - 5.11 MIL/uL   Hemoglobin 10.4 (L) 12.0 - 15.0 g/dL   HCT 60.6 (L) 30.1 - 60.1 %   MCV 98.5 80.0 - 100.0 fL   MCH 31.6 26.0 - 34.0 pg   MCHC 32.1 30.0 - 36.0  g/dL   RDW 40.9 81.1 - 91.4 %   Platelets 117 (L) 150 - 400 K/uL   nRBC 0.0 0.0 - 0.2 %    Comment: Performed at [facility], [address] [place], [place] [address]  Renal function panel     Status: Abnormal   Collection Time: 05/18/23  2:24 PM  Result Value Ref Range   Sodium 136 135 - 145 mmol/L   Potassium 4.2 3.5 - 5.1 mmol/L   Chloride 98 98 - 111 mmol/L   CO2 26 22 - 32 mmol/L   Glucose, Bld 117 (H) 70 - 99 mg/dL    Comment: Glucose reference range applies only to samples taken after fasting for at least 8 hours.   BUN 47 (H) 6 - 20 mg/dL   Creatinine, Ser 6.21 (H) 0.44 - 1.00 mg/dL   Calcium 8.7 (L) 8.9 - 10.3 mg/dL   Phosphorus 4.2 2.5 - 4.6 mg/dL   Albumin 3.4 (L) 3.5 - 5.0 g/dL   GFR, Estimated 7 (L) >60 mL/min    Comment: (NOTE) Calculated using the CKD-EPI Creatinine Equation (2021)    Anion gap 12 5 - 15    Comment: Performed at [facility], [address] [place], [place] [address]  CBC     Status: Abnormal   Collection Time: 05/19/23  7:43 AM  Result Value Ref Range   WBC 4.7 4.0 - 10.5 K/uL   RBC 3.36 (L) 3.87 - 5.11 MIL/uL   Hemoglobin 10.6 (L) 12.0 - 15.0 g/dL   HCT 78.4 (L) 69.6 - 29.5 %   MCV 97.9 80.0 - 100.0 fL   MCH 31.5 26.0 - 34.0 pg   MCHC 32.2 30.0 - 36.0 g/dL   RDW 28.4 13.2 - 44.0 %   Platelets 117 (L) 150 - 400 K/uL   nRBC 0.0 0.0 - 0.2 %    Comment: Performed at [facility], [address], [place], [place] [address]  Basic metabolic panel     Status: Abnormal   Collection Time: 05/19/23  7:43 AM  Result Value Ref Range   Sodium 136 135  - 145 mmol/L   Potassium 4.7 3.5 - 5.1 mmol/L   Chloride 97 (L) 98 - 111 mmol/L   CO2 24 22 - 32 mmol/L   Glucose, Bld 99 70 - 99 mg/dL    Comment: Glucose reference range applies only to samples taken after fasting for at least 8 hours.   BUN 56 (H) 6 - 20 mg/dL   Creatinine, Ser 5.36 (H) 0.44 - 1.00 mg/dL   Calcium 9.6 8.9 - 64.4 mg/dL   GFR, Estimated 5 (L) >60 mL/min    Comment: (NOTE) Calculated using the CKD-EPI Creatinine Equation (2021)    Anion gap 15 5 - 15    Comment: Performed at [facility], [address] [place], [place] [address]  CBC     Status: Abnormal   Collection Time: 05/19/23  9:40 AM  Result Value Ref Range   WBC 5.3 4.0 - 10.5 K/uL   RBC 3.54 (L) 3.87 - 5.11 MIL/uL   Hemoglobin 11.0 (L) 12.0 - 15.0 g/dL   HCT 25.9 (L) 56.3 - 87.5 %   MCV 97.7 80.0 - 100.0 fL   MCH 31.1 26.0 - 34.0 pg   MCHC 31.8 30.0 - 36.0 g/dL   RDW 64.3 32.9 - 51.8 %   Platelets 128 (L) 150 - 400 K/uL    Comment: REPEATED TO VERIFY  nRBC 0.0 0.0 - 0.2 %    Comment: Performed at [facility], [address] [place], [place] [address]  Renal function panel     Status: Abnormal   Collection Time: 05/19/23  9:40 AM  Result Value Ref Range   Sodium 134 (L) 135 - 145 mmol/L   Potassium 4.7 3.5 - 5.1 mmol/L   Chloride 96 (L) 98 - 111 mmol/L   CO2 25 22 - 32 mmol/L   Glucose, Bld 111 (H) 70 - 99 mg/dL    Comment: Glucose reference range applies only to samples taken after fasting for at least 8 hours.   BUN 59 (H) 6 - 20 mg/dL   Creatinine, Ser 6.04 (H) 0.44 - 1.00 mg/dL   Calcium 9.5 8.9 - 54.0 mg/dL   Phosphorus 4.4 2.5 - 4.6 mg/dL   Albumin 3.6 3.5 - 5.0 g/dL   GFR, Estimated 5 (L) >60 mL/min    Comment: (NOTE) Calculated using the CKD-EPI Creatinine Equation (2021)    Anion gap 13 5 - 15    Comment: Performed at [facility], [address] [place], [place] [address]       Assessment & Plan:   Problem List Items Addressed This Visit        Active Problems   ESRD on hemodialysis (HCC) - Primary (Chronic)   Patient is seen by Nephrology, who manage this condition.  She is well controlled with current therapy.  Is set up for procedure to obtain new dialysis access.   Will defer to them for further changes to plan of care.       Thrombocytopenia (HCC)   Likely 2/2 her ESRD.  Managed by nephrology.   Will defer to them for further treatment.        Other cirrhosis of liver (HCC)    Return in about 3 months (around 07/07/2023).   Total time spent: 20 minutes  Miki Kins, FNP  04/06/2023   This document may have been prepared by [facility] Voice Recognition software and as such may include unintentional dictation errors.

## 2023-04-07 ENCOUNTER — Ambulatory Visit
Admission: RE | Admit: 2023-04-07 | Discharge: 2023-04-07 | Disposition: A | Discharge status: Home / Self Care | Payer: 59 | Source: Ambulatory Visit | Attending: Internal Medicine | Admitting: Internal Medicine

## 2023-04-07 DIAGNOSIS — E8779 Other fluid overload: Secondary | ICD-10-CM | POA: Diagnosis not present

## 2023-04-07 DIAGNOSIS — R188 Other ascites: Secondary | ICD-10-CM | POA: Diagnosis not present

## 2023-04-07 DIAGNOSIS — Z992 Dependence on renal dialysis: Secondary | ICD-10-CM | POA: Diagnosis not present

## 2023-04-07 DIAGNOSIS — N2581 Secondary hyperparathyroidism of renal origin: Secondary | ICD-10-CM | POA: Diagnosis not present

## 2023-04-07 DIAGNOSIS — Z23 Encounter for immunization: Secondary | ICD-10-CM | POA: Diagnosis not present

## 2023-04-07 DIAGNOSIS — N186 End stage renal disease: Secondary | ICD-10-CM | POA: Diagnosis not present

## 2023-04-07 MED ORDER — LIDOCAINE HCL (PF) 1 % IJ SOLN
10.0000 mL | Freq: Once | INTRAMUSCULAR | Status: AC
  Administered 2023-04-07: 10 mL via INTRADERMAL
  Filled 2023-04-07: qty 10

## 2023-04-07 NOTE — Procedures (Signed)
PROCEDURE SUMMARY:  Unsuccessful image-guided paracentesis attempt from the right lower abdomen.  No immediate complications.  EBL = trace. Patient tolerated well.   Please see imaging section of Epic for full dictation.   Kennieth Francois PA-C 04/07/2023 2:23 PM

## 2023-04-08 ENCOUNTER — Ambulatory Visit: Payer: 59 | Admitting: Anesthesiology

## 2023-04-08 ENCOUNTER — Ambulatory Visit: Payer: 59 | Admitting: Family

## 2023-04-08 ENCOUNTER — Other Ambulatory Visit: Payer: Self-pay

## 2023-04-08 ENCOUNTER — Encounter
Admission: RE | Disposition: A | Discharge status: Home / Self Care | Payer: Self-pay | Source: Home / Self Care | Attending: Vascular Surgery

## 2023-04-08 ENCOUNTER — Ambulatory Visit
Admission: RE | Admit: 2023-04-08 | Discharge: 2023-04-08 | Disposition: A | Discharge status: Home / Self Care | Payer: 59 | Attending: Vascular Surgery | Admitting: Vascular Surgery

## 2023-04-08 ENCOUNTER — Other Ambulatory Visit: Payer: Self-pay | Admitting: Internal Medicine

## 2023-04-08 ENCOUNTER — Encounter: Payer: Self-pay | Admitting: Vascular Surgery

## 2023-04-08 DIAGNOSIS — Z992 Dependence on renal dialysis: Secondary | ICD-10-CM | POA: Diagnosis not present

## 2023-04-08 DIAGNOSIS — I252 Old myocardial infarction: Secondary | ICD-10-CM | POA: Diagnosis not present

## 2023-04-08 DIAGNOSIS — F17210 Nicotine dependence, cigarettes, uncomplicated: Secondary | ICD-10-CM | POA: Insufficient documentation

## 2023-04-08 DIAGNOSIS — E785 Hyperlipidemia, unspecified: Secondary | ICD-10-CM | POA: Diagnosis not present

## 2023-04-08 DIAGNOSIS — Y841 Kidney dialysis as the cause of abnormal reaction of the patient, or of later complication, without mention of misadventure at the time of the procedure: Secondary | ICD-10-CM | POA: Diagnosis not present

## 2023-04-08 DIAGNOSIS — I132 Hypertensive heart and chronic kidney disease with heart failure and with stage 5 chronic kidney disease, or end stage renal disease: Secondary | ICD-10-CM | POA: Insufficient documentation

## 2023-04-08 DIAGNOSIS — Z79899 Other long term (current) drug therapy: Secondary | ICD-10-CM | POA: Diagnosis not present

## 2023-04-08 DIAGNOSIS — N186 End stage renal disease: Secondary | ICD-10-CM | POA: Diagnosis not present

## 2023-04-08 DIAGNOSIS — I509 Heart failure, unspecified: Secondary | ICD-10-CM | POA: Insufficient documentation

## 2023-04-08 DIAGNOSIS — D631 Anemia in chronic kidney disease: Secondary | ICD-10-CM | POA: Diagnosis not present

## 2023-04-08 DIAGNOSIS — K219 Gastro-esophageal reflux disease without esophagitis: Secondary | ICD-10-CM | POA: Insufficient documentation

## 2023-04-08 DIAGNOSIS — E039 Hypothyroidism, unspecified: Secondary | ICD-10-CM | POA: Insufficient documentation

## 2023-04-08 DIAGNOSIS — Z8673 Personal history of transient ischemic attack (TIA), and cerebral infarction without residual deficits: Secondary | ICD-10-CM | POA: Insufficient documentation

## 2023-04-08 DIAGNOSIS — T82898A Other specified complication of vascular prosthetic devices, implants and grafts, initial encounter: Secondary | ICD-10-CM | POA: Diagnosis not present

## 2023-04-08 DIAGNOSIS — I2510 Atherosclerotic heart disease of native coronary artery without angina pectoris: Secondary | ICD-10-CM | POA: Insufficient documentation

## 2023-04-08 DIAGNOSIS — J449 Chronic obstructive pulmonary disease, unspecified: Secondary | ICD-10-CM | POA: Insufficient documentation

## 2023-04-08 DIAGNOSIS — I739 Peripheral vascular disease, unspecified: Secondary | ICD-10-CM | POA: Insufficient documentation

## 2023-04-08 DIAGNOSIS — I5042 Chronic combined systolic (congestive) and diastolic (congestive) heart failure: Secondary | ICD-10-CM | POA: Diagnosis not present

## 2023-04-08 DIAGNOSIS — R188 Other ascites: Secondary | ICD-10-CM

## 2023-04-08 HISTORY — PX: DIALYSIS/PERMA CATHETER INSERTION: CATH118288

## 2023-04-08 LAB — POTASSIUM (ARMC VASCULAR LAB ONLY): Potassium (ARMC vascular lab): 5.3 mmol/L — ABNORMAL HIGH (ref 3.5–5.1)

## 2023-04-08 SURGERY — DIALYSIS/PERMA CATHETER INSERTION
Anesthesia: General

## 2023-04-08 MED ORDER — PROPOFOL 10 MG/ML IV BOLUS
INTRAVENOUS | Status: AC
  Filled 2023-04-08: qty 40

## 2023-04-08 MED ORDER — SODIUM CHLORIDE 0.9 % IV SOLN: INTRAVENOUS | Status: DC

## 2023-04-08 MED ORDER — LIDOCAINE-EPINEPHRINE (PF) 1 %-1:200000 IJ SOLN
INTRAMUSCULAR | Status: DC | PRN
  Administered 2023-04-08: 20 mL via INTRADERMAL

## 2023-04-08 MED ORDER — CEFAZOLIN SODIUM-DEXTROSE 1-4 GM/50ML-% IV SOLN
1.0000 g | INTRAVENOUS | Status: AC
  Administered 2023-04-08: 2 g via INTRAVENOUS

## 2023-04-08 MED ORDER — HEPARIN (PORCINE) IN NACL 1000-0.9 UT/500ML-% IV SOLN
INTRAVENOUS | Status: DC | PRN
  Administered 2023-04-08: 500 mL

## 2023-04-08 MED ORDER — ONDANSETRON HCL 4 MG/2ML IJ SOLN
INTRAMUSCULAR | Status: DC | PRN
  Administered 2023-04-08: 4 mg via INTRAVENOUS

## 2023-04-08 MED ORDER — CEFAZOLIN SODIUM-DEXTROSE 1-4 GM/50ML-% IV SOLN
INTRAVENOUS | Status: AC
Start: 2023-04-08 — End: ?
  Filled 2023-04-08: qty 50

## 2023-04-08 MED ORDER — MIDAZOLAM HCL 2 MG/2ML IJ SOLN
INTRAMUSCULAR | Status: AC
  Filled 2023-04-08: qty 2

## 2023-04-08 MED ORDER — MIDAZOLAM HCL 2 MG/2ML IJ SOLN
INTRAMUSCULAR | Status: DC | PRN
  Administered 2023-04-08: 2 mg via INTRAVENOUS

## 2023-04-08 MED ORDER — PROPOFOL 10 MG/ML IV BOLUS
INTRAVENOUS | Status: DC | PRN
  Administered 2023-04-08: 100 mg via INTRAVENOUS
  Administered 2023-04-08: 20 mg via INTRAVENOUS
  Administered 2023-04-08 (×3): 50 mg via INTRAVENOUS

## 2023-04-08 MED ORDER — PROPOFOL 10 MG/ML IV BOLUS
INTRAVENOUS | Status: AC
Start: 2023-04-08 — End: ?
  Filled 2023-04-08: qty 20

## 2023-04-08 SURGICAL SUPPLY — 10 items
ADH SKN CLS APL DERMABOND .7 (GAUZE/BANDAGES/DRESSINGS) ×1
BIOPATCH RED 1 DISK 7.0 (GAUZE/BANDAGES/DRESSINGS) IMPLANT
CANNULA 5F STIFF (CANNULA) IMPLANT
CATH PALINDROME-P 19CM W/VT (CATHETERS) IMPLANT
COVER PROBE ULTRASOUND 5X96 (MISCELLANEOUS) IMPLANT
DERMABOND ADVANCED .7 DNX12 (GAUZE/BANDAGES/DRESSINGS) IMPLANT
DRAPE INCISE IOBAN 66X45 STRL (DRAPES) IMPLANT
PACK ANGIOGRAPHY (CUSTOM PROCEDURE TRAY) IMPLANT
SUT MNCRL AB 4-0 PS2 18 (SUTURE) IMPLANT
SUT PROLENE 0 CT 1 30 (SUTURE) IMPLANT

## 2023-04-08 NOTE — Interval H&P Note (Signed)
History and Physical Interval Note:  04/08/2023 12:30 PM  [name]  has presented today for surgery, with the diagnosis of Perma Cath Placement   ANESTHESIA   End Stage Renal.  The various methods of treatment have been discussed with the patient and family. After consideration of risks, benefits and other options for treatment, the patient has consented to  Procedure(s): DIALYSIS/PERMA CATHETER INSERTION (N/A) as a surgical intervention.  The patient's history has been reviewed, patient examined, no change in status, stable for surgery.  I have reviewed the patient's chart and labs.  Questions were answered to the patient's satisfaction.     Festus Barren

## 2023-04-08 NOTE — Anesthesia Preprocedure Evaluation (Signed)
Anesthesia Evaluation  Patient identified by MRN, date of birth, ID band Patient awake    Reviewed: Allergy & Precautions, NPO status , Patient's Chart, lab work & pertinent test results  History of Anesthesia Complications Negative for: history of anesthetic complications  Airway Mallampati: III  TM Distance: >3 FB Neck ROM: full    Dental  (+) Chipped, Poor Dentition, Missing   Pulmonary neg shortness of breath, COPD, Current Smoker and Patient abstained from smoking.   Pulmonary exam normal        Cardiovascular Exercise Tolerance: Good hypertension, (-) angina + CAD, + Past MI, + Peripheral Vascular Disease and +CHF  Normal cardiovascular exam     Neuro/Psych CVA, Residual Symptoms  negative psych ROS   GI/Hepatic ,GERD  Controlled,,(+) Hepatitis -  Endo/Other  Hypothyroidism    Renal/GU DialysisRenal disease  negative genitourinary   Musculoskeletal   Abdominal   Peds  Hematology negative hematology ROS (+)   Anesthesia Other Findings Past Medical History: No date: Adult behavior problems     Comment:  frontal lobe CVA No date: Anemia     Comment:  chronic disease 03/22/2014: Atrial flutter by electrocardiogram (HCC) No date: Cardiomyopathy (HCC) No date: CHF (congestive heart failure) (HCC) No date: Chicken pox 12/19/2021: Chronic respiratory failure with hypoxia (HCC) No date: Coronary artery disease 04/12/2014: Delayed surgical wound healing     Comment:  Overview:  Left leg No date: GERD (gastroesophageal reflux disease) 04/12/2014: H/O atrial flutter 02/13/2014: H/O stroke without residual deficits 02/27/2014: H/O: substance abuse (HCC) No date: Heart failure (HCC) No date: Hepatitis     Comment:  history of hep c 02/21/2014: History of CVA (cerebrovascular accident) 04/12/2014: History of delirium 11/13/2014: Hyperkalemia No date: Hyperlipidemia No date: Hypertension 02/24/2014:  Hypotension 02/26/2014: Leukocytosis No date: Myocardial infarction [facility]) No date: Obesity No date: Paroxysmal atrial fibrillation (HCC) No date: Peripheral vascular disease (HCC) 02/27/2014: Postoperative anemia due to acute blood loss No date: Renal failure No date: Renal insufficiency 2011: Stroke (HCC) 03/14/2014: Superficial incisional surgical site infection 07/25/2020: Syncope and collapse  Past Surgical History: 03/30/2019: A/V FISTULAGRAM; Left     Comment:  Procedure: A/V FISTULAGRAM;  Surgeon: Annice Needy, MD;               Location: [facility];  Service: Cardiovascular;              Laterality: Left; 10/06/2019: A/V FISTULAGRAM; Left     Comment:  Procedure: A/V FISTULAGRAM;  Surgeon: Annice Needy, MD;               Location: [facility];  Service: Cardiovascular;              Laterality: Left; 05/19/2021: A/V FISTULAGRAM; Left     Comment:  Procedure: A/V FISTULAGRAM;  Surgeon: Annice Needy, MD;               Location: [facility];  Service: Cardiovascular;              Laterality: Left; 01/29/2022: A/V FISTULAGRAM; Left     Comment:  Procedure: A/V Fistulagram;  Surgeon: Annice Needy, MD;               Location: [facility];  Service: Cardiovascular;              Laterality: Left; 04/30/2022: A/V FISTULAGRAM; Left     Comment:  Procedure: A/V Fistulagram;  Surgeon: Annice Needy,  MD;               Location: [facility];  Service: Cardiovascular;              Laterality: Left; 08/20/2022: A/V FISTULAGRAM; Left     Comment:  Procedure: A/V Fistulagram;  Surgeon: Annice Needy, MD;               Location: [facility];  Service: Cardiovascular;              Laterality: Left; 12/03/2022: A/V FISTULAGRAM; N/A     Comment:  Procedure: A/V Fistulagram;  Surgeon: Annice Needy, MD;               Location: [facility];  Service: Cardiovascular;              Laterality: N/A; 02/11/2023: A/V FISTULAGRAM; Left      Comment:  Procedure: A/V Fistulagram;  Surgeon: Annice Needy, MD;               Location: [facility];  Service: Cardiovascular;              Laterality: Left; No date: COLONOSCOPY 04/22/2021: COLONOSCOPY WITH PROPOFOL; N/A     Comment:  Procedure: COLONOSCOPY WITH PROPOFOL;  Surgeon:               Regis Bill, MD;  Location: [facility] ENDOSCOPY;                Service: Endoscopy;  Laterality: N/A; 2013: CORONARY ANGIOPLASTY WITH STENT PLACEMENT No date: CORONARY ARTERY BYPASS GRAFT No date: DIALYSIS FISTULA CREATION No date: ESOPHAGOGASTRODUODENOSCOPY 04/22/2021: ESOPHAGOGASTRODUODENOSCOPY; N/A     Comment:  Procedure: ESOPHAGOGASTRODUODENOSCOPY (EGD);  Surgeon:               Regis Bill, MD;  Location: [facility] ENDOSCOPY;                Service: Endoscopy;  Laterality: N/A; 02/23/2019: FLEXIBLE SIGMOIDOSCOPY; N/A     Comment:  Procedure: FLEXIBLE SIGMOIDOSCOPY;  Surgeon: Christena Deem, MD;  Location: [facility] ENDOSCOPY;  Service:               Endoscopy;  Laterality: N/A; 08/08/2018: LEFT HEART CATH AND CORS/GRAFTS ANGIOGRAPHY; N/A     Comment:  Procedure: LEFT HEART CATH AND CORS/GRAFTS ANGIOGRAPHY;               Surgeon: Laurier Nancy, MD;  Location: [facility]              LAB;  Service: Cardiovascular;  Laterality: N/A; 01/30/2019: LEFT HEART CATH AND CORS/GRAFTS ANGIOGRAPHY; N/A     Comment:  Procedure: LEFT HEART CATH AND CORS/GRAFTS ANGIOGRAPHY;               Surgeon: Laurier Nancy, MD;  Location: [facility]              LAB;  Service: Cardiovascular;  Laterality: N/A; No date: percutaneous insertion intra aortic balloon right cath No date: ultrasound guided pericardiocentesis  BMI    Body Mass Index: 25.29 kg/m      Reproductive/Obstetrics negative OB ROS                             Anesthesia Physical Anesthesia Plan  ASA: 4  Anesthesia Plan: General   Post-op Pain Management:    Induction:  Intravenous  PONV Risk Score and Plan: Propofol infusion and TIVA  Airway Management Planned: Natural Airway and Nasal Cannula  Additional Equipment:   Intra-op Plan:   Post-operative Plan:   Informed Consent: I have reviewed the patients History and Physical, chart, labs and discussed the procedure including the risks, benefits and alternatives for the proposed anesthesia with the patient or authorized representative who has indicated his/her understanding and acceptance.     Dental Advisory Given  Plan Discussed with: Anesthesiologist, CRNA and Surgeon  Anesthesia Plan Comments: (Patient consented for risks of anesthesia including but not limited to:  - adverse reactions to medications - risk of airway placement if required - damage to eyes, teeth, lips or other oral mucosa - nerve damage due to positioning  - sore throat or hoarseness - Damage to heart, brain, nerves, lungs, other parts of body or loss of life  Patient voiced understanding and assent.)       Anesthesia Quick Evaluation

## 2023-04-08 NOTE — Op Note (Signed)
OPERATIVE NOTE    PRE-OPERATIVE DIAGNOSIS: 1. ESRD 2. Left arm AVF with skin breakdown  POST-OPERATIVE DIAGNOSIS: same as above  PROCEDURE: Ultrasound guidance for vascular access to the right internal jugular vein Fluoroscopic guidance for placement of catheter Placement of a 19 cm tip to cuff tunneled hemodialysis catheter via the right internal jugular vein  SURGEON: Festus Barren, MD  ANESTHESIA:  MAC  ESTIMATED BLOOD LOSS: 20 cc  FLUORO TIME: less than one minute  CONTRAST: none  FINDING(S): 1.  Patent right internal jugular vein  SPECIMEN(S):  None  INDICATIONS:   [name] is a 62 y.o.female who presents with a left arm AV fistula with some skin breakdown and need for a PermCath for at least the next several weeks.  The patient needs long term dialysis access for their ESRD, and a Permcath is necessary.  Risks and benefits are discussed and informed consent is obtained.    DESCRIPTION: After obtaining full informed written consent, the patient was brought back to the vascular suited. The patient's right neck and chest were sterilely prepped and draped in a sterile surgical field was created.  Anesthesia provided sedation for the procedure.  The right internal jugular vein was visualized with ultrasound and found to be patent. It was then accessed under direct ultrasound guidance and a permanent image was recorded. A wire was placed. After skin nick and dilatation, the peel-away sheath was placed over the wire. I then turned my attention to an area under the clavicle. Approximately 1-2 fingerbreadths below the clavicle a small counterincision was created and tunneled from the subclavicular incision to the access site. Using fluoroscopic guidance, a 19 centimeter tip to cuff tunneled hemodialysis catheter was selected, and tunneled from the subclavicular incision to the access site. It was then placed through the peel-away sheath and the peel-away sheath was removed. Using  fluoroscopic guidance the catheter tips were parked in the right atrium. The appropriate distal connectors were placed. It withdrew blood well and flushed easily with heparinized saline and a concentrated heparin solution was then placed. It was secured to the chest wall with 2 Prolene sutures. The access incision was closed single 4-0 Monocryl. A 4-0 Monocryl pursestring suture was placed around the exit site. Sterile dressings were placed. The patient tolerated the procedure well and was taken to the recovery room in stable condition.  COMPLICATIONS: None  CONDITION: Stable  Festus Barren, MD 04/08/2023 2:23 PM   This note was created with Dragon Medical transcription system. Any errors in dictation are purely unintentional.

## 2023-04-08 NOTE — Transfer of Care (Signed)
Immediate Anesthesia Transfer of Care Note  Patient: [name]  Procedure(s) Performed: DIALYSIS/PERMA CATHETER INSERTION  Patient Location: PACU  Anesthesia Type:General  Level of Consciousness: awake  Airway & Oxygen Therapy: Patient Spontanous Breathing  Post-op Assessment: Report given to RN and Post -op Vital signs reviewed and stable  Post vital signs: Reviewed and stable  Last Vitals:  Vitals Value Taken Time  BP    Temp    Pulse    Resp    SpO2      Last Pain:  Vitals:   04/08/23 1227  TempSrc: Oral  PainSc: 0-No pain         Complications: There were no known notable events for this encounter.

## 2023-04-09 ENCOUNTER — Encounter: Payer: Self-pay | Admitting: Vascular Surgery

## 2023-04-09 DIAGNOSIS — N2581 Secondary hyperparathyroidism of renal origin: Secondary | ICD-10-CM | POA: Diagnosis not present

## 2023-04-09 DIAGNOSIS — Z23 Encounter for immunization: Secondary | ICD-10-CM | POA: Diagnosis not present

## 2023-04-09 DIAGNOSIS — Z992 Dependence on renal dialysis: Secondary | ICD-10-CM | POA: Diagnosis not present

## 2023-04-09 DIAGNOSIS — E8779 Other fluid overload: Secondary | ICD-10-CM | POA: Diagnosis not present

## 2023-04-09 DIAGNOSIS — N186 End stage renal disease: Secondary | ICD-10-CM | POA: Diagnosis not present

## 2023-04-09 NOTE — Anesthesia Postprocedure Evaluation (Signed)
Anesthesia Post Note  Patient: [name]  Procedure(s) Performed: DIALYSIS/PERMA CATHETER INSERTION  Patient location during evaluation: PACU Anesthesia Type: General Level of consciousness: awake Pain management: pain level controlled Respiratory status: spontaneous breathing Cardiovascular status: stable Anesthetic complications: no   There were no known notable events for this encounter.   Last Vitals:  Vitals:   04/08/23 1445 04/08/23 1500  BP: (!) 110/97 (!) 147/90  Pulse: 81 82  Resp: 18 18  Temp:    SpO2: 95% 92%    Last Pain:  Vitals:   04/08/23 1500  TempSrc:   PainSc: 0-No pain                 [name],Flower Franko

## 2023-04-12 ENCOUNTER — Ambulatory Visit
Admission: RE | Admit: 2023-04-12 | Discharge: 2023-04-12 | Disposition: A | Discharge status: Home / Self Care | Payer: 59 | Source: Ambulatory Visit | Attending: Internal Medicine | Admitting: Internal Medicine

## 2023-04-12 ENCOUNTER — Encounter: Payer: Self-pay | Admitting: Vascular Surgery

## 2023-04-12 DIAGNOSIS — N2581 Secondary hyperparathyroidism of renal origin: Secondary | ICD-10-CM | POA: Diagnosis not present

## 2023-04-12 DIAGNOSIS — E8779 Other fluid overload: Secondary | ICD-10-CM | POA: Diagnosis not present

## 2023-04-12 DIAGNOSIS — Z23 Encounter for immunization: Secondary | ICD-10-CM | POA: Diagnosis not present

## 2023-04-12 DIAGNOSIS — N186 End stage renal disease: Secondary | ICD-10-CM | POA: Diagnosis not present

## 2023-04-12 DIAGNOSIS — Z992 Dependence on renal dialysis: Secondary | ICD-10-CM | POA: Diagnosis not present

## 2023-04-12 DIAGNOSIS — R188 Other ascites: Secondary | ICD-10-CM | POA: Diagnosis not present

## 2023-04-12 MED ORDER — HEPARIN SODIUM (PORCINE) 10000 UNIT/ML IJ SOLN
INTRAMUSCULAR | Status: DC | PRN
  Administered 2023-04-08: 10000 [IU]

## 2023-04-12 MED ORDER — LIDOCAINE HCL (PF) 1 % IJ SOLN
10.0000 mL | Freq: Once | INTRAMUSCULAR | Status: AC
  Administered 2023-04-12: 10 mL via INTRADERMAL
  Filled 2023-04-12: qty 10

## 2023-04-14 ENCOUNTER — Encounter: Payer: Self-pay | Admitting: Vascular Surgery

## 2023-04-14 DIAGNOSIS — N2581 Secondary hyperparathyroidism of renal origin: Secondary | ICD-10-CM | POA: Diagnosis not present

## 2023-04-14 DIAGNOSIS — Z992 Dependence on renal dialysis: Secondary | ICD-10-CM | POA: Diagnosis not present

## 2023-04-14 DIAGNOSIS — Z23 Encounter for immunization: Secondary | ICD-10-CM | POA: Diagnosis not present

## 2023-04-14 DIAGNOSIS — N186 End stage renal disease: Secondary | ICD-10-CM | POA: Diagnosis not present

## 2023-04-14 DIAGNOSIS — E8779 Other fluid overload: Secondary | ICD-10-CM | POA: Diagnosis not present

## 2023-04-15 DIAGNOSIS — N186 End stage renal disease: Secondary | ICD-10-CM | POA: Diagnosis not present

## 2023-04-15 DIAGNOSIS — Z992 Dependence on renal dialysis: Secondary | ICD-10-CM | POA: Diagnosis not present

## 2023-04-16 ENCOUNTER — Other Ambulatory Visit: Payer: Self-pay | Admitting: Internal Medicine

## 2023-04-16 DIAGNOSIS — N186 End stage renal disease: Secondary | ICD-10-CM | POA: Diagnosis not present

## 2023-04-16 DIAGNOSIS — R188 Other ascites: Secondary | ICD-10-CM

## 2023-04-16 DIAGNOSIS — N2581 Secondary hyperparathyroidism of renal origin: Secondary | ICD-10-CM | POA: Diagnosis not present

## 2023-04-16 DIAGNOSIS — Z992 Dependence on renal dialysis: Secondary | ICD-10-CM | POA: Diagnosis not present

## 2023-04-19 DIAGNOSIS — N2581 Secondary hyperparathyroidism of renal origin: Secondary | ICD-10-CM | POA: Diagnosis not present

## 2023-04-19 DIAGNOSIS — N186 End stage renal disease: Secondary | ICD-10-CM | POA: Diagnosis not present

## 2023-04-19 DIAGNOSIS — Z992 Dependence on renal dialysis: Secondary | ICD-10-CM | POA: Diagnosis not present

## 2023-04-20 ENCOUNTER — Encounter: Payer: Self-pay | Admitting: Cardiovascular Disease

## 2023-04-20 ENCOUNTER — Ambulatory Visit (INDEPENDENT_AMBULATORY_CARE_PROVIDER_SITE_OTHER): Payer: 59 | Admitting: Cardiovascular Disease

## 2023-04-20 ENCOUNTER — Ambulatory Visit
Admission: RE | Admit: 2023-04-20 | Discharge: 2023-04-20 | Disposition: A | Discharge status: Home / Self Care | Payer: 59 | Source: Ambulatory Visit | Attending: Internal Medicine | Admitting: Internal Medicine

## 2023-04-20 ENCOUNTER — Other Ambulatory Visit: Payer: Self-pay | Admitting: Internal Medicine

## 2023-04-20 VITALS — BP 137/72 | HR 100 | Ht 66.0 in | Wt 162.4 lb

## 2023-04-20 DIAGNOSIS — I480 Paroxysmal atrial fibrillation: Secondary | ICD-10-CM

## 2023-04-20 DIAGNOSIS — R188 Other ascites: Secondary | ICD-10-CM | POA: Insufficient documentation

## 2023-04-20 DIAGNOSIS — Z992 Dependence on renal dialysis: Secondary | ICD-10-CM

## 2023-04-20 DIAGNOSIS — E782 Mixed hyperlipidemia: Secondary | ICD-10-CM | POA: Diagnosis not present

## 2023-04-20 DIAGNOSIS — R0602 Shortness of breath: Secondary | ICD-10-CM

## 2023-04-20 DIAGNOSIS — I5020 Unspecified systolic (congestive) heart failure: Secondary | ICD-10-CM

## 2023-04-20 DIAGNOSIS — N186 End stage renal disease: Secondary | ICD-10-CM | POA: Diagnosis not present

## 2023-04-20 MED ORDER — LOSARTAN POTASSIUM 25 MG PO TABS
25.0000 mg | ORAL_TABLET | Freq: Every day | ORAL | 1 refills | Status: DC
Start: 2023-04-20 — End: 2023-05-19

## 2023-04-20 NOTE — Progress Notes (Signed)
Cardiology Office Note   Date:  04/20/2023   ID:  [name], DOB [birth year], MRN [id number]  PCP:  Miki Kins, FNP  Cardiologist:  Adrian Blackwater, MD      History of Present Illness: [name] is a 62 y.o. female who presents for No chief complaint on file.   Still short of breath, getting paracentesis today thogh.  Shortness of Breath This is a recurrent problem. The current episode started more than 1 month ago. The problem has been waxing and waning.      Past Medical History:  Diagnosis Date  . Adult behavior problems    frontal lobe CVA  . Anemia    chronic disease  . Atrial flutter by electrocardiogram (HCC) 03/22/2014  . Cardiomyopathy (HCC)   . CHF (congestive heart failure) (HCC)   . Chicken pox   . Chronic respiratory failure with hypoxia (HCC) 12/19/2021  . Coronary artery disease   . Delayed surgical wound healing 04/12/2014   Overview:  Left leg  . GERD (gastroesophageal reflux disease)   . H/O atrial flutter 04/12/2014  . H/O stroke without residual deficits 02/13/2014  . H/O: substance abuse (HCC) 02/27/2014  . Heart failure (HCC)   . Hepatitis    history of hep c  . History of CVA (cerebrovascular accident) 02/21/2014  . History of delirium 04/12/2014  . Hyperkalemia 11/13/2014  . Hyperlipidemia   . Hypertension   . Hypotension 02/24/2014  . Leukocytosis 02/26/2014  . Myocardial infarction (HCC)   . Obesity   . Paroxysmal atrial fibrillation (HCC)   . Peripheral vascular disease (HCC)   . Postoperative anemia due to acute blood loss 02/27/2014  . Renal failure   . Renal insufficiency   . Stroke [facility]) 2011  . Superficial incisional surgical site infection 03/14/2014  . Syncope and collapse 07/25/2020     Past Surgical History:  Procedure Laterality Date  . A/V FISTULAGRAM Left 03/30/2019   Procedure: A/V FISTULAGRAM;  Surgeon: Annice Needy, MD;  Location: [facility];  Service: Cardiovascular;   Laterality: Left;  . A/V FISTULAGRAM Left 10/06/2019   Procedure: A/V FISTULAGRAM;  Surgeon: Annice Needy, MD;  Location: [facility];  Service: Cardiovascular;  Laterality: Left;  . A/V FISTULAGRAM Left 05/19/2021   Procedure: A/V FISTULAGRAM;  Surgeon: Annice Needy, MD;  Location: [facility];  Service: Cardiovascular;  Laterality: Left;  . A/V FISTULAGRAM Left 01/29/2022   Procedure: A/V Fistulagram;  Surgeon: Annice Needy, MD;  Location: [facility];  Service: Cardiovascular;  Laterality: Left;  . A/V FISTULAGRAM Left 04/30/2022   Procedure: A/V Fistulagram;  Surgeon: Annice Needy, MD;  Location: [facility];  Service: Cardiovascular;  Laterality: Left;  . A/V FISTULAGRAM Left 08/20/2022   Procedure: A/V Fistulagram;  Surgeon: Annice Needy, MD;  Location: [facility];  Service: Cardiovascular;  Laterality: Left;  . A/V FISTULAGRAM N/A 12/03/2022   Procedure: A/V Fistulagram;  Surgeon: Annice Needy, MD;  Location: [facility];  Service: Cardiovascular;  Laterality: N/A;  . A/V FISTULAGRAM Left 02/11/2023   Procedure: A/V Fistulagram;  Surgeon: Annice Needy, MD;  Location: [facility];  Service: Cardiovascular;  Laterality: Left;  . COLONOSCOPY    . COLONOSCOPY WITH PROPOFOL N/A 04/22/2021   Procedure: COLONOSCOPY WITH PROPOFOL;  Surgeon: Regis Bill, MD;  Location: [facility] ENDOSCOPY;  Service: Endoscopy;  Laterality: N/A;  . CORONARY  ANGIOPLASTY WITH STENT PLACEMENT  2013  . CORONARY ARTERY BYPASS GRAFT    . DIALYSIS FISTULA CREATION    . DIALYSIS/PERMA CATHETER INSERTION N/A 04/08/2023   Procedure: DIALYSIS/PERMA CATHETER INSERTION;  Surgeon: Annice Needy, MD;  Location: [facility];  Service: Cardiovascular;  Laterality: N/A;  . ESOPHAGOGASTRODUODENOSCOPY    . ESOPHAGOGASTRODUODENOSCOPY N/A 04/22/2021   Procedure: ESOPHAGOGASTRODUODENOSCOPY (EGD);  Surgeon: Regis Bill, MD;  Location: [facility] ENDOSCOPY;  Service:  Endoscopy;  Laterality: N/A;  . FLEXIBLE SIGMOIDOSCOPY N/A 02/23/2019   Procedure: FLEXIBLE SIGMOIDOSCOPY;  Surgeon: Christena Deem, MD;  Location: [facility] ENDOSCOPY;  Service: Endoscopy;  Laterality: N/A;  . LEFT HEART CATH AND CORS/GRAFTS ANGIOGRAPHY N/A 08/08/2018   Procedure: LEFT HEART CATH AND CORS/GRAFTS ANGIOGRAPHY;  Surgeon: Laurier Nancy, MD;  Location: [facility];  Service: Cardiovascular;  Laterality: N/A;  . LEFT HEART CATH AND CORS/GRAFTS ANGIOGRAPHY N/A 01/30/2019   Procedure: LEFT HEART CATH AND CORS/GRAFTS ANGIOGRAPHY;  Surgeon: Laurier Nancy, MD;  Location: [facility];  Service: Cardiovascular;  Laterality: N/A;  . percutaneous insertion intra aortic balloon right cath    . ultrasound guided pericardiocentesis       Current Outpatient Medications  Medication Sig Dispense Refill  . losartan (COZAAR) 25 MG tablet Take 1 tablet (25 mg total) by mouth daily. 90 tablet 1  . albuterol (VENTOLIN HFA) 108 (90 Base) MCG/ACT inhaler Inhale 1-2 puffs into the lungs every 6 (six) hours as needed for wheezing or shortness of breath. 20.1 g 3  . amiodarone (PACERONE) 200 MG tablet Take 200 mg by mouth daily.    Marland Kitchen apixaban (ELIQUIS) 5 MG TABS tablet Take 5 mg by mouth 2 (two) times daily.    . Bismuth Tribromoph-Petrolatum (XEROFORM PETROLAT PATCH 2"X2") PADS Apply 1 each topically every 3 (three) days. 25 each 0  . doxycycline (VIBRAMYCIN) 100 MG capsule Take 1 capsule (100 mg total) by mouth 2 (two) times daily. 20 capsule 0  . hydrOXYzine (ATARAX/VISTARIL) 50 MG tablet Take 50 mg by mouth at bedtime.    Marland Kitchen levothyroxine (SYNTHROID) 75 MCG tablet Take 75 mcg by mouth daily before breakfast.     . lidocaine-prilocaine (EMLA) cream Apply 1 application topically daily as needed (port access).    . midodrine (PROAMATINE) 10 MG tablet Take 1 tablet (10 mg total) by mouth 3 (three) times a week. Monday, Wednesday, Friday with hemodialysis    . omeprazole (PRILOSEC) 20  MG capsule Take 20 mg by mouth daily.    Marland Kitchen oxyCODONE-acetaminophen (PERCOCET/ROXICET) 5-325 MG tablet Take 1 tablet by mouth every 6 (six) hours as needed for severe pain (pain score 7-10). 20 tablet 0  . pantoprazole (PROTONIX) 40 MG tablet Take 40 mg by mouth daily.    . ranolazine (RANEXA) 1000 MG SR tablet Take 1,000 mg by mouth 2 (two) times daily.    . rosuvastatin (CRESTOR) 20 MG tablet Take 20 mg by mouth at bedtime.    . sevelamer carbonate (RENVELA) 800 MG tablet Take 3,200 mg by mouth 3 (three) times daily with meals.    . VELTASSA 8.4 g packet Take 1 packet by mouth daily.     No current facility-administered medications for this visit.   Facility-Administered Medications Ordered in Other Visits  Medication Dose Route Frequency Provider Last Rate Last Admin  . heparin injection    PRN Annice Needy, MD   10,000 Units at 04/08/23 1412    Allergies:   Morphine and  codeine, Levaquin [levofloxacin], Enalapril, Latex, and Tape    Social History:   reports that she has been smoking cigarettes. She has a 5 pack-year smoking history. She has never used smokeless tobacco. She reports that she does not drink alcohol and does not use drugs.   Family History:  family history includes Breast cancer in her maternal aunt and maternal grandmother; Cancer in an other family member; Diabetes type II in her maternal grandmother and mother; Hypertension in her father, mother, sister, and another family member; Kidney disease in her father; Ovarian cancer in her mother; Renal Disease in an other family member.    ROS:     Review of Systems  Constitutional: Negative.   HENT: Negative.    Eyes: Negative.   Respiratory:  Positive for shortness of breath.   Gastrointestinal: Negative.   Genitourinary: Negative.   Musculoskeletal: Negative.   Skin: Negative.   Neurological: Negative.   Endo/Heme/Allergies: Negative.   Psychiatric/Behavioral: Negative.    All other systems reviewed and are  negative.     All other systems are reviewed and negative.    PHYSICAL EXAM: VS:  BP 137/72   Pulse 100   Ht 5\' 6"  (1.676 m)   Wt 162 lb 6.4 oz (73.7 kg)   SpO2 (!) 73%   BMI 26.21 kg/m  , BMI Body mass index is 26.21 kg/m. Last weight:  Wt Readings from Last 3 Encounters:  04/20/23 162 lb 6.4 oz (73.7 kg)  04/08/23 156 lb 11.2 oz (71.1 kg)  04/06/23 164 lb (74.4 kg)     Physical Exam Constitutional:      Appearance: Normal appearance.  Cardiovascular:     Rate and Rhythm: Normal rate and regular rhythm.     Heart sounds: Normal heart sounds.  Pulmonary:     Effort: Pulmonary effort is normal.     Breath sounds: Normal breath sounds.  Musculoskeletal:     Right lower leg: No edema.     Left lower leg: No edema.  Neurological:     Mental Status: She is alert.      EKG:   Recent Labs: No results found for requested labs within last 365 days.    Lipid Panel    Component Value Date/Time   CHOL 140 01/18/2019 1358   CHOL 79 06/14/2014 0404   TRIG 148 01/18/2019 1358   TRIG 85 06/14/2014 0404   HDL 43 01/18/2019 1358   HDL 35 (L) 06/14/2014 0404   CHOLHDL 3.3 01/18/2019 1358   VLDL 30 01/18/2019 1358   VLDL 17 06/14/2014 0404   LDLCALC 67 01/18/2019 1358   LDLCALC 27 06/14/2014 0404      Other studies Reviewed: Additional studies/ records that were reviewed today include:  Review of the above records demonstrates:       No data to display            ASSESSMENT AND PLAN:    ICD-10-CM   1. Systolic congestive heart failure, unspecified HF chronicity (HCC)  I50.20 losartan (COZAAR) 25 MG tablet   LVEF on echo 35%, 4 chamber dilataion. BP better today and will add loastan 25    2. Paroxysmal atrial fibrillation (HCC)  I48.0 losartan (COZAAR) 25 MG tablet    3. Mixed hyperlipidemia  E78.2 losartan (COZAAR) 25 MG tablet    4. ESRD on hemodialysis (HCC)  N18.6 losartan (COZAAR) 25 MG tablet   Z99.2     5. SOB (shortness of breath)  R06.02  losartan (COZAAR)  25 MG tablet   Probably will improve after paracentesis       Problem List Items Addressed This Visit       Cardiovascular and Mediastinum   Paroxysmal atrial fibrillation (HCC)   Relevant Medications   losartan (COZAAR) 25 MG tablet     Genitourinary   ESRD on hemodialysis (HCC) (Chronic)   Relevant Medications   losartan (COZAAR) 25 MG tablet     Other   Hyperlipidemia   Relevant Medications   losartan (COZAAR) 25 MG tablet   Other Visit Diagnoses     Systolic congestive heart failure, unspecified HF chronicity (HCC)    -  Primary   LVEF on echo 35%, 4 chamber dilataion. BP better today and will add loastan 25   Relevant Medications   losartan (COZAAR) 25 MG tablet   SOB (shortness of breath)       Probably will improve after paracentesis   Relevant Medications   losartan (COZAAR) 25 MG tablet          Disposition:   Return in about 4 weeks (around 05/18/2023).    Total time spent: 30 minutes  Signed,  Adrian Blackwater, MD  04/20/2023 10:18 AM    [facility]

## 2023-04-21 DIAGNOSIS — N186 End stage renal disease: Secondary | ICD-10-CM | POA: Diagnosis not present

## 2023-04-21 DIAGNOSIS — N2581 Secondary hyperparathyroidism of renal origin: Secondary | ICD-10-CM | POA: Diagnosis not present

## 2023-04-21 DIAGNOSIS — Z992 Dependence on renal dialysis: Secondary | ICD-10-CM | POA: Diagnosis not present

## 2023-04-23 DIAGNOSIS — Z992 Dependence on renal dialysis: Secondary | ICD-10-CM | POA: Diagnosis not present

## 2023-04-23 DIAGNOSIS — N186 End stage renal disease: Secondary | ICD-10-CM | POA: Diagnosis not present

## 2023-04-23 DIAGNOSIS — N2581 Secondary hyperparathyroidism of renal origin: Secondary | ICD-10-CM | POA: Diagnosis not present

## 2023-04-26 ENCOUNTER — Other Ambulatory Visit: Payer: Self-pay | Admitting: Internal Medicine

## 2023-04-26 ENCOUNTER — Other Ambulatory Visit (INDEPENDENT_AMBULATORY_CARE_PROVIDER_SITE_OTHER): Payer: Self-pay | Admitting: Vascular Surgery

## 2023-04-26 DIAGNOSIS — N186 End stage renal disease: Secondary | ICD-10-CM | POA: Diagnosis not present

## 2023-04-26 DIAGNOSIS — R188 Other ascites: Secondary | ICD-10-CM

## 2023-04-26 DIAGNOSIS — N2581 Secondary hyperparathyroidism of renal origin: Secondary | ICD-10-CM | POA: Diagnosis not present

## 2023-04-26 DIAGNOSIS — Z992 Dependence on renal dialysis: Secondary | ICD-10-CM | POA: Diagnosis not present

## 2023-04-26 DIAGNOSIS — I509 Heart failure, unspecified: Secondary | ICD-10-CM | POA: Diagnosis not present

## 2023-04-27 ENCOUNTER — Encounter (INDEPENDENT_AMBULATORY_CARE_PROVIDER_SITE_OTHER): Payer: Self-pay | Admitting: Nurse Practitioner

## 2023-04-27 ENCOUNTER — Ambulatory Visit (INDEPENDENT_AMBULATORY_CARE_PROVIDER_SITE_OTHER): Payer: 59 | Admitting: Vascular Surgery

## 2023-04-27 ENCOUNTER — Ambulatory Visit (INDEPENDENT_AMBULATORY_CARE_PROVIDER_SITE_OTHER): Payer: 59

## 2023-04-27 ENCOUNTER — Encounter (INDEPENDENT_AMBULATORY_CARE_PROVIDER_SITE_OTHER): Payer: 59

## 2023-04-27 ENCOUNTER — Ambulatory Visit (INDEPENDENT_AMBULATORY_CARE_PROVIDER_SITE_OTHER): Payer: 59 | Admitting: Nurse Practitioner

## 2023-04-27 VITALS — BP 114/63 | HR 56 | Resp 16 | Wt 155.4 lb

## 2023-04-27 DIAGNOSIS — Z992 Dependence on renal dialysis: Secondary | ICD-10-CM

## 2023-04-27 DIAGNOSIS — I10 Essential (primary) hypertension: Secondary | ICD-10-CM | POA: Diagnosis not present

## 2023-04-27 DIAGNOSIS — I5042 Chronic combined systolic (congestive) and diastolic (congestive) heart failure: Secondary | ICD-10-CM | POA: Diagnosis not present

## 2023-04-27 DIAGNOSIS — N186 End stage renal disease: Secondary | ICD-10-CM

## 2023-04-27 NOTE — Assessment & Plan Note (Signed)
Managed medically.  Follows with cardiology.

## 2023-04-27 NOTE — Assessment & Plan Note (Signed)
blood pressure control important in reducing the progression of atherosclerotic disease. On appropriate oral medications.  

## 2023-04-27 NOTE — Assessment & Plan Note (Signed)
Her left arm access site is markedly improved and really back to its baseline.  There is definitely a lot of scar tissue and some thinning of the skin because this has been used for many years now, but all in all I think it would be prudent to go ahead and start using that again.  We have given it a rest of several weeks with a PermCath.  We will leave her PermCath in place for several more weeks to ensure that this works.  I will plan to see her back in about a month's time.  If for some reason her left arm access becomes unusable again, her options for access in the right arm would be a basilic transposition versus an AV graft.  We discussed those today and those are reasonable backup option.

## 2023-04-27 NOTE — Progress Notes (Signed)
MRN : [id number]  [name] is a 62 y.o. ([birth year]) female who presents with chief complaint of  Chief Complaint  Patient presents with   Follow-up    [facility] 3 week follow up  .  History of Present Illness: Patient returns today in follow up of her dialysis access.  A few weeks ago, she developed erythema and drainage from her left upper arm AV access.  She was treated with oral antibiotics and this resolved once we put in a PermCath and did not use the fistula for a few weeks.  The access is actually healed well and looks fairly normal today.  Her PermCath is irritating to her because of the adhesives but it is working well for dialysis.  We performed noninvasive studies to evaluate her right arm for access today.  Arterial studies were fairly normal.  Venous study showed no usable cephalic vein in the upper arm.  The right basilic vein is likely usable for a basilic transposition and the deep veins would support an AV graft in the upper arm.  Current Outpatient Medications  Medication Sig Dispense Refill   albuterol (VENTOLIN HFA) 108 (90 Base) MCG/ACT inhaler Inhale 1-2 puffs into the lungs every 6 (six) hours as needed for wheezing or shortness of breath. 20.1 g 3   amiodarone (PACERONE) 200 MG tablet Take 200 mg by mouth daily.     apixaban (ELIQUIS) 5 MG TABS tablet Take 5 mg by mouth 2 (two) times daily.     Bismuth Tribromoph-Petrolatum (XEROFORM PETROLAT PATCH 2"X2") PADS Apply 1 each topically every 3 (three) days. 25 each 0   doxycycline (VIBRAMYCIN) 100 MG capsule Take 1 capsule (100 mg total) by mouth 2 (two) times daily. 20 capsule 0   hydrOXYzine (ATARAX/VISTARIL) 50 MG tablet Take 50 mg by mouth at bedtime.     levothyroxine (SYNTHROID) 75 MCG tablet Take 75 mcg by mouth daily before breakfast.      lidocaine-prilocaine (EMLA) cream Apply 1 application topically daily as needed (port access).     losartan (COZAAR) 25 MG tablet Take 1 tablet (25 mg total) by mouth  daily. 90 tablet 1   midodrine (PROAMATINE) 10 MG tablet Take 1 tablet (10 mg total) by mouth 3 (three) times a week. Monday, Wednesday, Friday with hemodialysis     omeprazole (PRILOSEC) 20 MG capsule Take 20 mg by mouth daily.     oxyCODONE-acetaminophen (PERCOCET/ROXICET) 5-325 MG tablet Take 1 tablet by mouth every 6 (six) hours as needed for severe pain (pain score 7-10). 20 tablet 0   pantoprazole (PROTONIX) 40 MG tablet Take 40 mg by mouth daily.     ranolazine (RANEXA) 1000 MG SR tablet Take 1,000 mg by mouth 2 (two) times daily.     rosuvastatin (CRESTOR) 20 MG tablet Take 20 mg by mouth at bedtime.     sevelamer carbonate (RENVELA) 800 MG tablet Take 3,200 mg by mouth 3 (three) times daily with meals.     VELTASSA 8.4 g packet Take 1 packet by mouth daily.     No current facility-administered medications for this visit.   Facility-Administered Medications Ordered in Other Visits  Medication Dose Route Frequency Provider Last Rate Last Admin   heparin injection    PRN Annice Needy, MD   10,000 Units at 04/08/23 1412    Past Medical History:  Diagnosis Date   Adult behavior problems    frontal lobe CVA   Anemia    chronic disease  Atrial flutter by electrocardiogram (HCC) 03/22/2014   Cardiomyopathy (HCC)    CHF (congestive heart failure) (HCC)    Chicken pox    Chronic respiratory failure with hypoxia (HCC) 12/19/2021   Coronary artery disease    Delayed surgical wound healing 04/12/2014   Overview:  Left leg   GERD (gastroesophageal reflux disease)    H/O atrial flutter 04/12/2014   H/O stroke without residual deficits 02/13/2014   H/O: substance abuse (HCC) 02/27/2014   Heart failure (HCC)    Hepatitis    history of hep c   History of CVA (cerebrovascular accident) 02/21/2014   History of delirium 04/12/2014   Hyperkalemia 11/13/2014   Hyperlipidemia    Hypertension    Hypotension 02/24/2014   Leukocytosis 02/26/2014   Myocardial infarction [facility])    Obesity     Paroxysmal atrial fibrillation (HCC)    Peripheral vascular disease (HCC)    Postoperative anemia due to acute blood loss 02/27/2014   Renal failure    Renal insufficiency    Stroke [facility]) 2011   Superficial incisional surgical site infection 03/14/2014   Syncope and collapse 07/25/2020    Past Surgical History:  Procedure Laterality Date   A/V FISTULAGRAM Left 03/30/2019   Procedure: A/V FISTULAGRAM;  Surgeon: Annice Needy, MD;  Location: [facility] INVASIVE CV LAB;  Service: Cardiovascular;  Laterality: Left;   A/V FISTULAGRAM Left 10/06/2019   Procedure: A/V FISTULAGRAM;  Surgeon: Annice Needy, MD;  Location: [facility] INVASIVE CV LAB;  Service: Cardiovascular;  Laterality: Left;   A/V FISTULAGRAM Left 05/19/2021   Procedure: A/V FISTULAGRAM;  Surgeon: Annice Needy, MD;  Location: [facility] INVASIVE CV LAB;  Service: Cardiovascular;  Laterality: Left;   A/V FISTULAGRAM Left 01/29/2022   Procedure: A/V Fistulagram;  Surgeon: Annice Needy, MD;  Location: [facility] INVASIVE CV LAB;  Service: Cardiovascular;  Laterality: Left;   A/V FISTULAGRAM Left 04/30/2022   Procedure: A/V Fistulagram;  Surgeon: Annice Needy, MD;  Location: [facility] INVASIVE CV LAB;  Service: Cardiovascular;  Laterality: Left;   A/V FISTULAGRAM Left 08/20/2022   Procedure: A/V Fistulagram;  Surgeon: Annice Needy, MD;  Location: [facility] INVASIVE CV LAB;  Service: Cardiovascular;  Laterality: Left;   A/V FISTULAGRAM N/A 12/03/2022   Procedure: A/V Fistulagram;  Surgeon: Annice Needy, MD;  Location: [facility] INVASIVE CV LAB;  Service: Cardiovascular;  Laterality: N/A;   A/V FISTULAGRAM Left 02/11/2023   Procedure: A/V Fistulagram;  Surgeon: Annice Needy, MD;  Location: [facility] INVASIVE CV LAB;  Service: Cardiovascular;  Laterality: Left;   COLONOSCOPY     COLONOSCOPY WITH PROPOFOL N/A 04/22/2021   Procedure: COLONOSCOPY WITH PROPOFOL;  Surgeon: Regis Bill, MD;  Location: [facility] ENDOSCOPY;  Service: Endoscopy;  Laterality: N/A;   CORONARY ANGIOPLASTY  WITH STENT PLACEMENT  2013   CORONARY ARTERY BYPASS GRAFT     DIALYSIS FISTULA CREATION     DIALYSIS/PERMA CATHETER INSERTION N/A 04/08/2023   Procedure: DIALYSIS/PERMA CATHETER INSERTION;  Surgeon: Annice Needy, MD;  Location: [facility] INVASIVE CV LAB;  Service: Cardiovascular;  Laterality: N/A;   ESOPHAGOGASTRODUODENOSCOPY     ESOPHAGOGASTRODUODENOSCOPY N/A 04/22/2021   Procedure: ESOPHAGOGASTRODUODENOSCOPY (EGD);  Surgeon: Regis Bill, MD;  Location: [facility] ENDOSCOPY;  Service: Endoscopy;  Laterality: N/A;   FLEXIBLE SIGMOIDOSCOPY N/A 02/23/2019   Procedure: FLEXIBLE SIGMOIDOSCOPY;  Surgeon: Christena Deem, MD;  Location: [facility] ENDOSCOPY;  Service: Endoscopy;  Laterality: N/A;   LEFT HEART CATH AND CORS/GRAFTS ANGIOGRAPHY N/A 08/08/2018   Procedure: LEFT HEART CATH  AND CORS/GRAFTS ANGIOGRAPHY;  Surgeon: Laurier Nancy, MD;  Location: [facility] INVASIVE CV LAB;  Service: Cardiovascular;  Laterality: N/A;   LEFT HEART CATH AND CORS/GRAFTS ANGIOGRAPHY N/A 01/30/2019   Procedure: LEFT HEART CATH AND CORS/GRAFTS ANGIOGRAPHY;  Surgeon: Laurier Nancy, MD;  Location: [facility] INVASIVE CV LAB;  Service: Cardiovascular;  Laterality: N/A;   percutaneous insertion intra aortic balloon right cath     ultrasound guided pericardiocentesis       Social History   Tobacco Use   Smoking status: Some Days    Current packs/day: 0.25    Average packs/day: 0.3 packs/day for 20.0 years (5.0 ttl pk-yrs)    Types: Cigarettes   Smokeless tobacco: Never   Tobacco comments:    smokes two cigarettes a day  Vaping Use   Vaping status: Never Used  Substance Use Topics   Alcohol use: No   Drug use: No      Family History  Problem Relation Age of Onset   Hypertension Mother    Ovarian cancer Mother    Diabetes type II Mother    Hypertension Father    Kidney disease Father    Hypertension Sister    Diabetes type II Maternal Grandmother    Breast cancer Maternal Grandmother    Breast cancer Maternal  Aunt    Hypertension Other    Cancer Other    Renal Disease Other      Allergies  Allergen Reactions   Morphine And Codeine Hives   Levaquin [Levofloxacin] Itching    Severe itching; prickly sensation   Enalapril Other (See Comments)    hallucinations   Latex Rash   Tape Rash and Other (See Comments)     REVIEW OF SYSTEMS (Negative unless checked)  Constitutional: [] Weight loss  [] Fever  [] Chills Cardiac: [x] Chest pain   [] Chest pressure   [x] Palpitations   [] Shortness of breath when laying flat   [] Shortness of breath at rest   [x] Shortness of breath with exertion. Vascular:  [] Pain in legs with walking   [] Pain in legs at rest   [] Pain in legs when laying flat   [] Claudication   [] Pain in feet when walking  [] Pain in feet at rest  [] Pain in feet when laying flat   [] History of DVT   [] Phlebitis   [] Swelling in legs   [] Varicose veins   [] Non-healing ulcers Pulmonary:   [] Uses home oxygen   [] Productive cough   [] Hemoptysis   [] Wheeze  [] COPD   [] Asthma Neurologic:  [] Dizziness  [] Blackouts   [] Seizures   [x] History of stroke   [] History of TIA  [] Aphasia   [] Temporary blindness   [] Dysphagia   [] Weakness or numbness in arms   [] Weakness or numbness in legs Musculoskeletal:  [] Arthritis   [] Joint swelling   [] Joint pain   [] Low back pain Hematologic:  [] Easy bruising  [] Easy bleeding   [] Hypercoagulable state   [x] Anemic   Gastrointestinal:  [] Blood in stool   [] Vomiting blood  [] Gastroesophageal reflux/heartburn   [] Abdominal pain Genitourinary:  [x] Chronic kidney disease   [] Difficult urination  [] Frequent urination  [] Burning with urination   [] Hematuria Skin:  [] Rashes   [] Ulcers   [] Wounds Psychological:  [] History of anxiety   []  History of major depression.  Physical Examination  BP 114/63 (BP Location: Right Arm)   Pulse (!) 56   Resp 16   Wt 155 lb 6.4 oz (70.5 kg)   BMI 25.08 kg/m  Gen:  WD/WN, NAD Head: [place]/AT, No temporalis wasting. Ear/Nose/Throat:  Hearing  grossly intact, nares w/o erythema or drainage Eyes: Conjunctiva clear. Sclera non-icteric Neck: Supple.  Trachea midline Pulmonary:  Good air movement, no use of accessory muscles.  Cardiac: irregular Vascular: Left upper arm access with good thrill.  No erythema.  No drainage.  There are some areas of mildly thin skin, but overall this appears reasonably healthy and adequate for use for dialysis. Vessel Right Left  Radial Palpable Palpable               Musculoskeletal: M/S 5/5 throughout.  No deformity or atrophy. Mild LE edema. Neurologic: Sensation grossly intact in extremities.  Symmetrical.  Speech is fluent.  Psychiatric: Judgment intact, Mood & affect appropriate for pt's clinical situation. Dermatologic: No rashes or ulcers noted.  No cellulitis or open wounds.      Labs Recent Results (from the past 2160 hour(s))  Potassium [facility] vascular lab only)     Status: None   Collection Time: 02/11/23 11:39 AM  Result Value Ref Range   Potassium [facility] vascular lab) 4.4 3.5 - 5.1 mmol/L    Comment: Performed at [facility], [address] [place], [place] [address]  Aerobic culture     Status: None   Collection Time: 03/25/23 10:27 AM  Result Value Ref Range   Aerobic Bacterial Culture Final report    Organism ID, Bacteria Mixed skin flora     Comment: Scant growth  Potassium [facility] vascular lab only)     Status: Abnormal   Collection Time: 04/08/23  1:02 PM  Result Value Ref Range   Potassium [facility] vascular lab) 5.3 (H) 3.5 - 5.1 mmol/L    Comment: Performed at [facility], [address] [place], [place] [address]    Radiology [place] ASCITES (ABDOMEN LIMITED)  Result Date: 04/20/2023 INDICATION: 62 year old female. History of end-stage renal disease with recurrent ascites. Request is for therapeutic paracentesis EXAM: ULTRASOUND ABDOMEN LIMITED FINDINGS: Imaging of all 4 quadrants of the abdomen reveals trace ascites. IMPRESSION: No significant  ascites seen by ultrasound. No paracentesis performed. Read by: Anders Grant, NP Electronically Signed   By: Olive Bass M.D.   On: 04/20/2023 13:42   US Paracentesis  Result Date: 04/12/2023 INDICATION: Ascites EXAM: ULTRASOUND GUIDED  PARACENTESIS MEDICATIONS: None. COMPLICATIONS: None immediate. PROCEDURE: Informed written consent was obtained from the patient after a discussion of the risks, benefits and alternatives to treatment. A timeout was performed prior to the initiation of the procedure. Initial ultrasound scanning demonstrates a moderate amount of ascites within the right lower abdominal quadrant. The right lower abdomen was prepped and draped in the usual sterile fashion. 1% lidocaine was used for local anesthesia. Following this, a 19 gauge, 7-cm, Yueh catheter was introduced. An ultrasound image was saved for documentation purposes. The paracentesis was performed. The catheter was removed and a dressing was applied. The patient tolerated the procedure well without immediate post procedural complication. FINDINGS: A total of approximately 1.75 L of clear peritoneal fluid was removed. IMPRESSION: Successful ultrasound-guided paracentesis yielding 1.75 liters of peritoneal fluid. Electronically Signed   By: Olive Bass M.D.   On: 04/12/2023 15:13   PERIPHERAL VASCULAR CATHETERIZATION  Result Date: 04/08/2023 See surgical note for result.  US Paracentesis  Result Date: 04/07/2023 INDICATION: Patient with history of end-stage renal disease with recurrent ascites. Therapeutic paracentesis requested. EXAM: ULTRASOUND GUIDED  PARACENTESIS MEDICATIONS: 10 mL 1% lidocaine COMPLICATIONS: None immediate. PROCEDURE: Informed written consent was obtained from the patient after a discussion of the risks, benefits  and alternatives to treatment. A timeout was performed prior to the initiation of the procedure. Initial ultrasound scanning demonstrates a small amount of ascites within the  right lower abdominal quadrant. The right lower abdomen was prepped and draped in the usual sterile fashion. 1% lidocaine was used for local anesthesia. Following this, a 19 gauge, 7-cm, Yueh catheter was introduced. An ultrasound image was saved for documentation purposes. A unsuccessful paracentesis attempt was made. Decision was made not to attempt again due to significant patient movement and small amount of ascites present. The catheter was removed and a dressing was applied. The patient tolerated the procedure well without immediate post procedural complication. FINDINGS: Unsuccessful paracentesis attempt. IMPRESSION: Attempted paracentesis, unsuccessful due to small volume fluid and patient motion. The patient will return in several days to allow for additional fluid accumulation. Procedure performed by Mina Marble, PA-C Electronically Signed   By: Olive Bass M.D.   On: 04/07/2023 14:58   MYOCARDIAL PERFUSION IMAGING  Result Date: 04/02/2023 Images from the original result were not included. STUDY: Gated Stress / Rest Myocardial Perfusion Imaging Tomographic (SPECT) Including attenuation correction Wall Motion, Left Ventricular Ejection Fraction By Gated Technique. Persantine Stress test. SEX: female  ARMS UP: Yes INDICATION FOR STUDY: R06.09 Technique: Approximately 20 minutes following the Intravenous administration  of 9.6 mCi Tc-26m Sestamibi after stress testing in a reclined supine position with arms above their head if able to do so, gated SPECT imaging of the heart was performed. After about a 2hr break, the patient was injected intravenously with 32.3 mCi of Tc-35m Sestamibi. Approximately 45 minutes later in the same position as stress imaging SPECT rest imaging of the heart was performed. STRESSED BY: Adrian Blackwater, MD PROTOCOL: Persantine    DOSE  ADMINISTERED: 8.0 cc              ROUTE OF ADMINISTRATION: IV MAX PRED HR: 160  85%: 136                 75%: 120 RESTING BP:  100/60 RESTING  HR:  64 PEAK BP: 100/60    PEAK HR: 65 EXERCISE DURATION: 4 min injection REASON FOR TEST TERMINATION: Protocol end SYMPTOMS: None EKG RESULTS: NSR. 62/min. RBBB. No significant ST changes with persantine. IMAGE QUALITY: Good PERFUSION/WALL MOTION FINDINGS: EF = 24%. Dilated LV. Large severe fixed partially reversible basal and mid inferolateral and anterolateral walls, fixed basal mid and apical inferior wall defects, diffuse hypokinesis.   Infarction in the LCX/RCA territories with severe LV dysfunction, dilated LV and diffuse hypokinesis. Adrian Blackwater, MD Stress Interpreting Physician / Nuclear Interpreting Physician   PCV ECHOCARDIOGRAM COMPLETE  Result Date: 04/01/2023 Images from the original result were not included. Reason for Visit  INDICATIONS:  I48.0 Echocardiogram: An echocardiogram in (2-d) mode was performed and in Doppler mode with color flow velocity mapping was performed. ventricular septum thickness 1.34 cm, L ventricular posterior wall thickness (diastole) 1.55 cm, left atrium size 4.9 cm, aortic root diameter 3.7 cm, L ventricle diastolic dimension 5.38 cm, L ventricle systolic dimension 3.12, L ventricle ejection fraction 35 %, and LV fractional shortening 42 % L ventricular outflow tract internal diameter 2.7 cm, L ventricular outflow tract flow velocity .947 m/s, aortic valve cusps 2.0 cm , aortic valve flow velocity 1.54 (m/sec), aortic valve systolic calculated mean flow gradient 5 mmHg, mitral valve diastolic peak flow velocity E .798 m/sec, and mitral valve diastolic peak flow E/A ratio 1.5 Mitral valve has mild regurgitation Aortic valve has  trace regurgitation Pulmonic valve has mild regurgitation Tricuspid valve has moderate regurgitation ASSESSMENT Technically adequate study. Mild left ventricular hypertrophy with GRADE 2 (psuedonormalization) diastolic dysfunction. Normal right ventricular systolic function. Normal right ventricular diastolic function. Severe diffuse  hypokinesis left ventricular wall motion. Severe diffuse hypokinesis right ventricular wall motion. Mild pulmonary regurgitation. Moderate tricuspid regurgitation. Severe pulmonary hypertension. Mild mitral regurgitation. Trace aortic regurgitation. No pericardial effusion. Severe 4 chamber dilation with severe LV systolic dysfunction Visualized LVEF = 35%    Assessment/Plan  ESRD on hemodialysis (HCC) Her left arm access site is markedly improved and really back to its baseline.  There is definitely a lot of scar tissue and some thinning of the skin because this has been used for many years now, but all in all I think it would be prudent to go ahead and start using that again.  We have given it a rest of several weeks with a PermCath.  We will leave her PermCath in place for several more weeks to ensure that this works.  I will plan to see her back in about a month's time.  If for some reason her left arm access becomes unusable again, her options for access in the right arm would be a basilic transposition versus an AV graft.  We discussed those today and those are reasonable backup option.  Chronic combined systolic and diastolic CHF (congestive heart failure) (HCC) Managed medically.  Follows with cardiology.  Hypertension blood pressure control important in reducing the progression of atherosclerotic disease. On appropriate oral medications.    Festus Barren, MD  04/27/2023 10:06 AM    This note was created with Dragon medical transcription system.  Any errors from dictation are purely unintentional

## 2023-04-28 DIAGNOSIS — N2581 Secondary hyperparathyroidism of renal origin: Secondary | ICD-10-CM | POA: Diagnosis not present

## 2023-04-28 DIAGNOSIS — N186 End stage renal disease: Secondary | ICD-10-CM | POA: Diagnosis not present

## 2023-04-28 DIAGNOSIS — Z992 Dependence on renal dialysis: Secondary | ICD-10-CM | POA: Diagnosis not present

## 2023-04-30 ENCOUNTER — Ambulatory Visit
Admission: RE | Admit: 2023-04-30 | Discharge: 2023-04-30 | Disposition: A | Discharge status: Home / Self Care | Payer: 59 | Source: Ambulatory Visit | Attending: Internal Medicine | Admitting: Internal Medicine

## 2023-04-30 ENCOUNTER — Other Ambulatory Visit: Payer: Self-pay | Admitting: Internal Medicine

## 2023-04-30 DIAGNOSIS — Z992 Dependence on renal dialysis: Secondary | ICD-10-CM | POA: Diagnosis not present

## 2023-04-30 DIAGNOSIS — R188 Other ascites: Secondary | ICD-10-CM

## 2023-04-30 DIAGNOSIS — N2581 Secondary hyperparathyroidism of renal origin: Secondary | ICD-10-CM | POA: Diagnosis not present

## 2023-04-30 DIAGNOSIS — N186 End stage renal disease: Secondary | ICD-10-CM | POA: Diagnosis not present

## 2023-05-03 DIAGNOSIS — Z992 Dependence on renal dialysis: Secondary | ICD-10-CM | POA: Diagnosis not present

## 2023-05-03 DIAGNOSIS — N2581 Secondary hyperparathyroidism of renal origin: Secondary | ICD-10-CM | POA: Diagnosis not present

## 2023-05-03 DIAGNOSIS — N186 End stage renal disease: Secondary | ICD-10-CM | POA: Diagnosis not present

## 2023-05-05 DIAGNOSIS — N2581 Secondary hyperparathyroidism of renal origin: Secondary | ICD-10-CM | POA: Diagnosis not present

## 2023-05-05 DIAGNOSIS — Z992 Dependence on renal dialysis: Secondary | ICD-10-CM | POA: Diagnosis not present

## 2023-05-05 DIAGNOSIS — N186 End stage renal disease: Secondary | ICD-10-CM | POA: Diagnosis not present

## 2023-05-07 DIAGNOSIS — Z992 Dependence on renal dialysis: Secondary | ICD-10-CM | POA: Diagnosis not present

## 2023-05-07 DIAGNOSIS — N186 End stage renal disease: Secondary | ICD-10-CM | POA: Diagnosis not present

## 2023-05-07 DIAGNOSIS — N2581 Secondary hyperparathyroidism of renal origin: Secondary | ICD-10-CM | POA: Diagnosis not present

## 2023-05-10 DIAGNOSIS — Z992 Dependence on renal dialysis: Secondary | ICD-10-CM | POA: Diagnosis not present

## 2023-05-10 DIAGNOSIS — N2581 Secondary hyperparathyroidism of renal origin: Secondary | ICD-10-CM | POA: Diagnosis not present

## 2023-05-10 DIAGNOSIS — N186 End stage renal disease: Secondary | ICD-10-CM | POA: Diagnosis not present

## 2023-05-12 DIAGNOSIS — N186 End stage renal disease: Secondary | ICD-10-CM | POA: Diagnosis not present

## 2023-05-12 DIAGNOSIS — N2581 Secondary hyperparathyroidism of renal origin: Secondary | ICD-10-CM | POA: Diagnosis not present

## 2023-05-12 DIAGNOSIS — Z992 Dependence on renal dialysis: Secondary | ICD-10-CM | POA: Diagnosis not present

## 2023-05-14 DIAGNOSIS — Z992 Dependence on renal dialysis: Secondary | ICD-10-CM | POA: Diagnosis not present

## 2023-05-14 DIAGNOSIS — N2581 Secondary hyperparathyroidism of renal origin: Secondary | ICD-10-CM | POA: Diagnosis not present

## 2023-05-14 DIAGNOSIS — N186 End stage renal disease: Secondary | ICD-10-CM | POA: Diagnosis not present

## 2023-05-15 DIAGNOSIS — N186 End stage renal disease: Secondary | ICD-10-CM | POA: Diagnosis not present

## 2023-05-15 DIAGNOSIS — Z992 Dependence on renal dialysis: Secondary | ICD-10-CM | POA: Diagnosis not present

## 2023-05-17 ENCOUNTER — Inpatient Hospital Stay: Admit: 2023-05-17 | Payer: 59

## 2023-05-17 ENCOUNTER — Other Ambulatory Visit: Payer: 59

## 2023-05-17 ENCOUNTER — Emergency Department: Payer: 59

## 2023-05-17 ENCOUNTER — Other Ambulatory Visit: Payer: Self-pay

## 2023-05-17 ENCOUNTER — Inpatient Hospital Stay
Admission: EM | Admit: 2023-05-17 | Discharge: 2023-05-19 | DRG: 302 | Disposition: A | Discharge status: Home / Self Care | Payer: 59 | Attending: Internal Medicine | Admitting: Internal Medicine

## 2023-05-17 ENCOUNTER — Inpatient Hospital Stay
Admit: 2023-05-17 | Discharge: 2023-05-17 | Disposition: A | Discharge status: Home / Self Care | Payer: 59 | Attending: Internal Medicine | Admitting: Internal Medicine

## 2023-05-17 DIAGNOSIS — Z8673 Personal history of transient ischemic attack (TIA), and cerebral infarction without residual deficits: Secondary | ICD-10-CM

## 2023-05-17 DIAGNOSIS — R519 Headache, unspecified: Secondary | ICD-10-CM | POA: Diagnosis not present

## 2023-05-17 DIAGNOSIS — I214 Non-ST elevation (NSTEMI) myocardial infarction: Secondary | ICD-10-CM

## 2023-05-17 DIAGNOSIS — I429 Cardiomyopathy, unspecified: Secondary | ICD-10-CM | POA: Diagnosis not present

## 2023-05-17 DIAGNOSIS — Z91048 Other nonmedicinal substance allergy status: Secondary | ICD-10-CM

## 2023-05-17 DIAGNOSIS — D696 Thrombocytopenia, unspecified: Secondary | ICD-10-CM | POA: Diagnosis present

## 2023-05-17 DIAGNOSIS — I959 Hypotension, unspecified: Secondary | ICD-10-CM | POA: Diagnosis not present

## 2023-05-17 DIAGNOSIS — R001 Bradycardia, unspecified: Secondary | ICD-10-CM | POA: Diagnosis not present

## 2023-05-17 DIAGNOSIS — I2489 Other forms of acute ischemic heart disease: Secondary | ICD-10-CM | POA: Diagnosis present

## 2023-05-17 DIAGNOSIS — Z22322 Carrier or suspected carrier of Methicillin resistant Staphylococcus aureus: Secondary | ICD-10-CM

## 2023-05-17 DIAGNOSIS — I132 Hypertensive heart and chronic kidney disease with heart failure and with stage 5 chronic kidney disease, or end stage renal disease: Secondary | ICD-10-CM | POA: Diagnosis present

## 2023-05-17 DIAGNOSIS — Z7901 Long term (current) use of anticoagulants: Secondary | ICD-10-CM | POA: Diagnosis not present

## 2023-05-17 DIAGNOSIS — Z79890 Hormone replacement therapy (postmenopausal): Secondary | ICD-10-CM

## 2023-05-17 DIAGNOSIS — J449 Chronic obstructive pulmonary disease, unspecified: Secondary | ICD-10-CM | POA: Diagnosis present

## 2023-05-17 DIAGNOSIS — Z743 Need for continuous supervision: Secondary | ICD-10-CM | POA: Diagnosis not present

## 2023-05-17 DIAGNOSIS — I25118 Atherosclerotic heart disease of native coronary artery with other forms of angina pectoris: Principal | ICD-10-CM | POA: Diagnosis present

## 2023-05-17 DIAGNOSIS — E785 Hyperlipidemia, unspecified: Secondary | ICD-10-CM | POA: Diagnosis present

## 2023-05-17 DIAGNOSIS — K219 Gastro-esophageal reflux disease without esophagitis: Secondary | ICD-10-CM | POA: Diagnosis not present

## 2023-05-17 DIAGNOSIS — R0989 Other specified symptoms and signs involving the circulatory and respiratory systems: Secondary | ICD-10-CM | POA: Diagnosis not present

## 2023-05-17 DIAGNOSIS — R079 Chest pain, unspecified: Principal | ICD-10-CM | POA: Diagnosis present

## 2023-05-17 DIAGNOSIS — N186 End stage renal disease: Secondary | ICD-10-CM

## 2023-05-17 DIAGNOSIS — I739 Peripheral vascular disease, unspecified: Secondary | ICD-10-CM | POA: Diagnosis not present

## 2023-05-17 DIAGNOSIS — Z91040 Latex allergy status: Secondary | ICD-10-CM

## 2023-05-17 DIAGNOSIS — I200 Unstable angina: Secondary | ICD-10-CM | POA: Diagnosis present

## 2023-05-17 DIAGNOSIS — D631 Anemia in chronic kidney disease: Secondary | ICD-10-CM | POA: Diagnosis not present

## 2023-05-17 DIAGNOSIS — R7989 Other specified abnormal findings of blood chemistry: Secondary | ICD-10-CM

## 2023-05-17 DIAGNOSIS — Z888 Allergy status to other drugs, medicaments and biological substances status: Secondary | ICD-10-CM

## 2023-05-17 DIAGNOSIS — Z951 Presence of aortocoronary bypass graft: Secondary | ICD-10-CM | POA: Diagnosis not present

## 2023-05-17 DIAGNOSIS — E669 Obesity, unspecified: Secondary | ICD-10-CM | POA: Diagnosis present

## 2023-05-17 DIAGNOSIS — I5022 Chronic systolic (congestive) heart failure: Secondary | ICD-10-CM | POA: Diagnosis present

## 2023-05-17 DIAGNOSIS — R069 Unspecified abnormalities of breathing: Secondary | ICD-10-CM | POA: Diagnosis not present

## 2023-05-17 DIAGNOSIS — Z79899 Other long term (current) drug therapy: Secondary | ICD-10-CM

## 2023-05-17 DIAGNOSIS — R0789 Other chest pain: Secondary | ICD-10-CM | POA: Diagnosis not present

## 2023-05-17 DIAGNOSIS — N2581 Secondary hyperparathyroidism of renal origin: Secondary | ICD-10-CM | POA: Diagnosis present

## 2023-05-17 DIAGNOSIS — Z841 Family history of disorders of kidney and ureter: Secondary | ICD-10-CM

## 2023-05-17 DIAGNOSIS — Z6824 Body mass index (BMI) 24.0-24.9, adult: Secondary | ICD-10-CM

## 2023-05-17 DIAGNOSIS — R0602 Shortness of breath: Secondary | ICD-10-CM

## 2023-05-17 DIAGNOSIS — I252 Old myocardial infarction: Secondary | ICD-10-CM

## 2023-05-17 DIAGNOSIS — Z885 Allergy status to narcotic agent status: Secondary | ICD-10-CM

## 2023-05-17 DIAGNOSIS — R778 Other specified abnormalities of plasma proteins: Secondary | ICD-10-CM | POA: Diagnosis not present

## 2023-05-17 DIAGNOSIS — I517 Cardiomegaly: Secondary | ICD-10-CM | POA: Diagnosis not present

## 2023-05-17 DIAGNOSIS — R0689 Other abnormalities of breathing: Secondary | ICD-10-CM | POA: Diagnosis not present

## 2023-05-17 DIAGNOSIS — Z992 Dependence on renal dialysis: Secondary | ICD-10-CM | POA: Diagnosis not present

## 2023-05-17 DIAGNOSIS — Z881 Allergy status to other antibiotic agents status: Secondary | ICD-10-CM

## 2023-05-17 DIAGNOSIS — Z8249 Family history of ischemic heart disease and other diseases of the circulatory system: Secondary | ICD-10-CM | POA: Diagnosis not present

## 2023-05-17 DIAGNOSIS — R6889 Other general symptoms and signs: Secondary | ICD-10-CM | POA: Diagnosis not present

## 2023-05-17 DIAGNOSIS — F17210 Nicotine dependence, cigarettes, uncomplicated: Secondary | ICD-10-CM | POA: Diagnosis present

## 2023-05-17 DIAGNOSIS — I480 Paroxysmal atrial fibrillation: Secondary | ICD-10-CM | POA: Diagnosis not present

## 2023-05-17 DIAGNOSIS — Z955 Presence of coronary angioplasty implant and graft: Secondary | ICD-10-CM

## 2023-05-17 DIAGNOSIS — I499 Cardiac arrhythmia, unspecified: Secondary | ICD-10-CM | POA: Diagnosis not present

## 2023-05-17 LAB — ECHOCARDIOGRAM COMPLETE
AR max vel: 2.1 cm2
AV Area VTI: 2.21 cm2
AV Area mean vel: 1.91 cm2
AV Mean grad: 4.5 mm[Hg]
AV Peak grad: 9.7 mm[Hg]
Ao pk vel: 1.56 m/s
Area-P 1/2: 6.17 cm2
Calc EF: 24.5 %
Height: 66 in
MV VTI: 1.75 cm2
S' Lateral: 5.8 cm
Single Plane A2C EF: 30.9 %
Single Plane A4C EF: 25.1 %
Weight: 2592 [oz_av]

## 2023-05-17 LAB — COMPREHENSIVE METABOLIC PANEL
ALT: 16 U/L (ref 0–44)
AST: 24 U/L (ref 15–41)
Albumin: 3.6 g/dL (ref 3.5–5.0)
Alkaline Phosphatase: 105 U/L (ref 38–126)
Anion gap: 15 (ref 5–15)
BUN: 64 mg/dL — ABNORMAL HIGH (ref 6–20)
CO2: 22 mmol/L (ref 22–32)
Calcium: 9.6 mg/dL (ref 8.9–10.3)
Chloride: 100 mmol/L (ref 98–111)
Creatinine, Ser: 8.54 mg/dL — ABNORMAL HIGH (ref 0.44–1.00)
GFR, Estimated: 5 mL/min — ABNORMAL LOW (ref >60)
Glucose, Bld: 102 mg/dL — ABNORMAL HIGH (ref 70–99)
Potassium: 5 mmol/L (ref 3.5–5.1)
Sodium: 137 mmol/L (ref 135–145)
Total Bilirubin: 0.8 mg/dL (ref <1.2)
Total Protein: 7.7 g/dL (ref 6.5–8.1)

## 2023-05-17 LAB — HEPATITIS B SURFACE ANTIGEN: Hepatitis B Surface Ag: NONREACTIVE

## 2023-05-17 LAB — CBC WITH DIFFERENTIAL/PLATELET
Abs Immature Granulocytes: 0.03 10*3/uL (ref 0.00–0.07)
Basophils Absolute: 0 10*3/uL (ref 0.0–0.1)
Basophils Relative: 0 %
Eosinophils Absolute: 0.1 10*3/uL (ref 0.0–0.5)
Eosinophils Relative: 3 %
HCT: 37.1 % (ref 36.0–46.0)
Hemoglobin: 11.7 g/dL — ABNORMAL LOW (ref 12.0–15.0)
Immature Granulocytes: 1 %
Lymphocytes Relative: 22 %
Lymphs Abs: 1.1 10*3/uL (ref 0.7–4.0)
MCH: 31.5 pg (ref 26.0–34.0)
MCHC: 31.5 g/dL (ref 30.0–36.0)
MCV: 99.7 fL (ref 80.0–100.0)
Monocytes Absolute: 0.5 10*3/uL (ref 0.1–1.0)
Monocytes Relative: 11 %
Neutro Abs: 3.1 10*3/uL (ref 1.7–7.7)
Neutrophils Relative %: 63 %
Platelets: 113 10*3/uL — ABNORMAL LOW (ref 150–400)
RBC: 3.72 MIL/uL — ABNORMAL LOW (ref 3.87–5.11)
RDW: 15.2 % (ref 11.5–15.5)
WBC: 4.9 10*3/uL (ref 4.0–10.5)
nRBC: 0 % (ref 0.0–0.2)

## 2023-05-17 LAB — TROPONIN I (HIGH SENSITIVITY)
Troponin I (High Sensitivity): 75 ng/L — ABNORMAL HIGH (ref <18)
Troponin I (High Sensitivity): 68 ng/L — ABNORMAL HIGH (ref <18)

## 2023-05-17 LAB — PROTIME-INR
INR: 1.6 — ABNORMAL HIGH (ref 0.8–1.2)
Prothrombin Time: 18.9 s — ABNORMAL HIGH (ref 11.4–15.2)

## 2023-05-17 LAB — APTT
aPTT: 30 s (ref 24–36)
aPTT: 62 s — ABNORMAL HIGH (ref 24–36)

## 2023-05-17 LAB — HEPARIN LEVEL (UNFRACTIONATED): Heparin Unfractionated: >1.1 [IU]/mL — ABNORMAL HIGH (ref 0.30–0.70)

## 2023-05-17 LAB — LACTIC ACID, PLASMA: Lactic Acid, Venous: 0.9 mmol/L (ref 0.5–1.9)

## 2023-05-17 MED ORDER — ACETAMINOPHEN 325 MG PO TABS
650.0000 mg | ORAL_TABLET | Freq: Four times a day (QID) | ORAL | Status: DC | PRN
  Administered 2023-05-18: 650 mg via ORAL
  Filled 2023-05-17: qty 2

## 2023-05-17 MED ORDER — LEVOTHYROXINE SODIUM 50 MCG PO TABS
75.0000 ug | ORAL_TABLET | Freq: Every day | ORAL | Status: DC
  Administered 2023-05-17 – 2023-05-19 (×3): 75 ug via ORAL
  Filled 2023-05-17 (×2): qty 1
  Filled 2023-05-17: qty 2

## 2023-05-17 MED ORDER — SEVELAMER CARBONATE 800 MG PO TABS
3200.0000 mg | ORAL_TABLET | Freq: Three times a day (TID) | ORAL | Status: DC
  Administered 2023-05-17 – 2023-05-18 (×4): 3200 mg via ORAL
  Filled 2023-05-17 (×4): qty 4

## 2023-05-17 MED ORDER — ASPIRIN 300 MG RE SUPP: 300.0000 mg | RECTAL | Status: AC

## 2023-05-17 MED ORDER — XEROFORM PETROLAT PATCH 2"X2" EX PADS: 1.0000 | MEDICATED_PAD | CUTANEOUS | Status: DC

## 2023-05-17 MED ORDER — MIDODRINE HCL 5 MG PO TABS: 2.5000 mg | ORAL_TABLET | Freq: Three times a day (TID) | ORAL | Status: DC

## 2023-05-17 MED ORDER — HEPARIN (PORCINE) 25000 UT/250ML-% IV SOLN
1050.0000 [IU]/h | INTRAVENOUS | Status: DC
  Administered 2023-05-17: 900 [IU]/h via INTRAVENOUS
  Filled 2023-05-17 (×2): qty 250

## 2023-05-17 MED ORDER — RANOLAZINE ER 500 MG PO TB12: 1000.0000 mg | ORAL_TABLET | Freq: Two times a day (BID) | ORAL | Status: DC

## 2023-05-17 MED ORDER — CHLORHEXIDINE GLUCONATE CLOTH 2 % EX PADS
6.0000 | MEDICATED_PAD | Freq: Every day | CUTANEOUS | Status: DC
Start: 2023-05-18 — End: 2023-05-19
  Administered 2023-05-18 – 2023-05-19 (×2): 6 via TOPICAL
  Filled 2023-05-17: qty 6

## 2023-05-17 MED ORDER — ROSUVASTATIN CALCIUM 10 MG PO TABS
20.0000 mg | ORAL_TABLET | Freq: Every day | ORAL | Status: DC
  Administered 2023-05-17 – 2023-05-18 (×2): 20 mg via ORAL
  Filled 2023-05-17: qty 1
  Filled 2023-05-17: qty 2

## 2023-05-17 MED ORDER — HEPARIN SODIUM (PORCINE) 1000 UNIT/ML DIALYSIS
1000.0000 [IU] | INTRAMUSCULAR | Status: DC | PRN
  Filled 2023-05-17: qty 1

## 2023-05-17 MED ORDER — OXYCODONE-ACETAMINOPHEN 5-325 MG PO TABS
1.0000 | ORAL_TABLET | Freq: Four times a day (QID) | ORAL | Status: DC | PRN
  Administered 2023-05-18 – 2023-05-19 (×2): 1 via ORAL
  Filled 2023-05-17 (×2): qty 1

## 2023-05-17 MED ORDER — SODIUM CHLORIDE 0.9 % IV BOLUS
500.0000 mL | Freq: Once | INTRAVENOUS | Status: AC
  Administered 2023-05-17: 500 mL via INTRAVENOUS

## 2023-05-17 MED ORDER — LIDOCAINE-PRILOCAINE 2.5-2.5 % EX CREA: 1.0000 | TOPICAL_CREAM | CUTANEOUS | Status: DC | PRN

## 2023-05-17 MED ORDER — PANTOPRAZOLE SODIUM 40 MG PO TBEC
40.0000 mg | DELAYED_RELEASE_TABLET | Freq: Every day | ORAL | Status: DC
  Administered 2023-05-17 – 2023-05-19 (×3): 40 mg via ORAL
  Filled 2023-05-17 (×3): qty 1

## 2023-05-17 MED ORDER — ONDANSETRON HCL 4 MG/2ML IJ SOLN
4.0000 mg | Freq: Four times a day (QID) | INTRAMUSCULAR | Status: DC | PRN
  Administered 2023-05-17: 4 mg via INTRAVENOUS
  Filled 2023-05-17: qty 2

## 2023-05-17 MED ORDER — PATIROMER SORBITEX CALCIUM 8.4 G PO PACK
1.0000 | PACK | Freq: Every day | ORAL | Status: DC
Start: 2023-05-17 — End: 2023-05-19
  Filled 2023-05-17 (×4): qty 1

## 2023-05-17 MED ORDER — PENTAFLUOROPROP-TETRAFLUOROETH EX AERO
1.0000 | INHALATION_SPRAY | CUTANEOUS | Status: DC | PRN
  Filled 2023-05-17: qty 30

## 2023-05-17 MED ORDER — ALBUTEROL SULFATE (2.5 MG/3ML) 0.083% IN NEBU: 3.0000 mL | INHALATION_SOLUTION | Freq: Four times a day (QID) | RESPIRATORY_TRACT | Status: DC | PRN

## 2023-05-17 MED ORDER — ASPIRIN 81 MG PO CHEW
324.0000 mg | CHEWABLE_TABLET | Freq: Once | ORAL | Status: AC
Start: 2023-05-17 — End: 2023-05-17
  Administered 2023-05-17: 324 mg via ORAL
  Filled 2023-05-17: qty 4

## 2023-05-17 MED ORDER — HYDROXYZINE HCL 25 MG PO TABS
50.0000 mg | ORAL_TABLET | Freq: Every day | ORAL | Status: DC
  Administered 2023-05-17 – 2023-05-18 (×2): 50 mg via ORAL
  Filled 2023-05-17 (×2): qty 2

## 2023-05-17 MED ORDER — ALBUMIN HUMAN 25 % IV SOLN: 25.0000 g | Freq: Once | INTRAVENOUS | Status: DC

## 2023-05-17 MED ORDER — MIDODRINE HCL 5 MG PO TABS
10.0000 mg | ORAL_TABLET | ORAL | Status: DC
  Administered 2023-05-17: 10 mg via ORAL
  Filled 2023-05-17: qty 2

## 2023-05-17 MED ORDER — RANOLAZINE ER 500 MG PO TB12
500.0000 mg | ORAL_TABLET | Freq: Two times a day (BID) | ORAL | Status: DC
  Administered 2023-05-17 – 2023-05-18 (×2): 500 mg via ORAL
  Filled 2023-05-17 (×2): qty 1

## 2023-05-17 MED ORDER — MIDODRINE HCL 5 MG PO TABS
ORAL_TABLET | ORAL | Status: AC
Start: 2023-05-17 — End: ?
  Filled 2023-05-17: qty 2

## 2023-05-17 MED ORDER — MIDODRINE HCL 5 MG PO TABS: 10.0000 mg | ORAL_TABLET | ORAL | Status: DC

## 2023-05-17 MED ORDER — NITROGLYCERIN 0.4 MG SL SUBL
0.4000 mg | SUBLINGUAL_TABLET | SUBLINGUAL | Status: DC | PRN
  Administered 2023-05-18 (×3): 0.4 mg via SUBLINGUAL
  Filled 2023-05-17: qty 1

## 2023-05-17 MED ORDER — ASPIRIN 81 MG PO CHEW
324.0000 mg | CHEWABLE_TABLET | ORAL | Status: AC
  Administered 2023-05-17: 324 mg via ORAL
  Filled 2023-05-17: qty 4

## 2023-05-17 MED ORDER — HEPARIN BOLUS VIA INFUSION
1100.0000 [IU] | Freq: Once | INTRAVENOUS | Status: AC
Start: 2023-05-17 — End: 2023-05-17
  Administered 2023-05-17: 1100 [IU] via INTRAVENOUS
  Filled 2023-05-17: qty 1100

## 2023-05-17 MED ORDER — HEPARIN BOLUS VIA INFUSION
4000.0000 [IU] | Freq: Once | INTRAVENOUS | Status: AC
  Administered 2023-05-17: 4000 [IU] via INTRAVENOUS
  Filled 2023-05-17: qty 4000

## 2023-05-17 NOTE — ED Notes (Signed)
Patient given a dinner tray.

## 2023-05-17 NOTE — ED Notes (Signed)
Called lab about the add on orders for the INR, PT-INR, ATP, and to verify that a blue top was sent down earlier this morning. Lab confirmed that they did receive the tube and that they will run those labs ASAP.

## 2023-05-17 NOTE — ED Notes (Signed)
Echo at bedside

## 2023-05-17 NOTE — Consult Note (Addendum)
PHARMACY - ANTICOAGULATION CONSULT NOTE  Pharmacy Consult for Heparin  Indication: chest pain/ACS  Allergies  Allergen Reactions   Morphine And Codeine Hives   Levaquin [Levofloxacin] Itching    Severe itching; prickly sensation   Enalapril Other (See Comments)    hallucinations   Latex Rash   Tape Rash and Other (See Comments)   Patient Measurements: Height: 5\' 6"  (167.6 cm) Weight: 73.5 kg (162 lb) IBW/kg (Calculated) : 59.3 Heparin Dosing Weight: 73.5 kg   Vital Signs: Temp: 97.9 F (36.6 C) (12/02 0735) Temp Source: Oral (12/02 0735) BP: 93/58 (12/02 0930) Pulse Rate: 49 (12/02 0930)  Labs: Recent Labs    05/17/23 0747  HGB 11.7*  HCT 37.1  PLT 113*  CREATININE 8.54*  TROPONINIHS 75*   Estimated Creatinine Clearance: 7.2 mL/min (A) (by C-G formula based on SCr of 8.54 mg/dL (H)).  Medical History: Past Medical History:  Diagnosis Date   Adult behavior problems    frontal lobe CVA   Anemia    chronic disease   Atrial flutter by electrocardiogram (HCC) 03/22/2014   Cardiomyopathy (HCC)    CHF (congestive heart failure) (HCC)    Chicken pox    Chronic respiratory failure with hypoxia (HCC) 12/19/2021   Coronary artery disease    Delayed surgical wound healing 04/12/2014   Overview:  Left leg   GERD (gastroesophageal reflux disease)    H/O atrial flutter 04/12/2014   H/O stroke without residual deficits 02/13/2014   H/O: substance abuse (HCC) 02/27/2014   Heart failure (HCC)    Hepatitis    history of hep c   History of CVA (cerebrovascular accident) 02/21/2014   History of delirium 04/12/2014   Hyperkalemia 11/13/2014   Hyperlipidemia    Hypertension    Hypotension 02/24/2014   Leukocytosis 02/26/2014   Myocardial infarction (HCC)    Obesity    Paroxysmal atrial fibrillation (HCC)    Peripheral vascular disease (HCC)    Postoperative anemia due to acute blood loss 02/27/2014   Renal failure    Renal insufficiency    Stroke [facility]) 2011    Superficial incisional surgical site infection 03/14/2014   Syncope and collapse 07/25/2020   Medications:  PTA Eliquis - last dose taken 12/01 PM at 2100 per patient   Assessment: [name] is a 62 year female that presented with hypotension and chest pain following her dialysis session this morning. Patient's troponin elevated to 75. Per patient, she takes Eliquis at home and last dose was taken last night around 2100. Pharmacy has been consulted for management of a heparin infusion. Baseline labs: Hgb 11.7, PLT 113, aPTT, HL, and PT/INR pending. No signs/symptoms of bleeding noted.   Goal of Therapy:  Heparin level 0.3-0.7 units/ml Monitor platelets by anticoagulation protocol: Yes   Plan:  Give 4000 unit bolus x 1  Start heparin infusion at 900 units/hr  Check aPTT 8 hours after initiation of infusion Will monitor aPTT levels until correlation with anti-Xa levels    Monitor CBC daily while on heparin   Littie Deeds, PharmD Pharmacy Resident  05/17/2023 10:04 AM

## 2023-05-17 NOTE — ED Notes (Signed)
Pt report provided to [facility] RN pt transferred to room 35 pt remains aox4 appears in nad smiling no emesis event post consuming food from home pt provided requested food and drink in room 35 [place] O2 provided per pt request pt on heart monitor and pulse provided blankets for comfort lights low per pt request 22g PIV to RFA remains cdi patent secured not infiltrated @ this time infusing Heparin gtt per order pt noted to stand and take multiple steps without apparent difficulty non labored resps noted equal bilateral chest rise and fall skin appears wdi no stridor VSS

## 2023-05-17 NOTE — Progress Notes (Signed)
*  PRELIMINARY RESULTS* Echocardiogram 2D Echocardiogram has been performed.  [name] 05/17/2023, 1:58 PM

## 2023-05-17 NOTE — ED Notes (Signed)
This nurse gave report to dialysis Avis, RN

## 2023-05-17 NOTE — Consult Note (Signed)
[name] is a 62 y.o. female  [id number]  Primary Cardiologist: Adrian Blackwater Reason for Consultation: Chest pain  HPI: This is a 62 year old African-American female with a past medical history of CABG last cardiac catheterization 2020 which showed all the bypass grafts were patent presented to the hospital after having episode where she was hypotensive and bradycardic during dialysis and dialysis was canceled and sent to for evaluation because of chest pain.  She still has intermittent chest pain but appears to be atypical.   Review of Systems: No orthopnea PND or leg swelling   Past Medical History:  Diagnosis Date   Adult behavior problems    frontal lobe CVA   Anemia    chronic disease   Atrial flutter by electrocardiogram (HCC) 03/22/2014   Cardiomyopathy (HCC)    CHF (congestive heart failure) (HCC)    Chicken pox    Chronic respiratory failure with hypoxia (HCC) 12/19/2021   Coronary artery disease    Delayed surgical wound healing 04/12/2014   Overview:  Left leg   GERD (gastroesophageal reflux disease)    H/O atrial flutter 04/12/2014   H/O stroke without residual deficits 02/13/2014   H/O: substance abuse (HCC) 02/27/2014   Heart failure (HCC)    Hepatitis    history of hep c   History of CVA (cerebrovascular accident) 02/21/2014   History of delirium 04/12/2014   Hyperkalemia 11/13/2014   Hyperlipidemia    Hypertension    Hypotension 02/24/2014   Leukocytosis 02/26/2014   Myocardial infarction [facility])    Obesity    Paroxysmal atrial fibrillation (HCC)    Peripheral vascular disease (HCC)    Postoperative anemia due to acute blood loss 02/27/2014   Renal failure    Renal insufficiency    Stroke [facility]) 2011   Superficial incisional surgical site infection 03/14/2014   Syncope and collapse 07/25/2020    (Not in a hospital admission)     heparin  4,000 Units Intravenous Once   hydrOXYzine  50 mg Oral QHS   levothyroxine  75 mcg Oral Q0600    [START ON 05/19/2023] midodrine  10 mg Oral Q M,W,F-HD   pantoprazole  40 mg Oral Daily   patiromer  1 packet Oral Daily   ranolazine  500 mg Oral BID   rosuvastatin  20 mg Oral QHS   sevelamer carbonate  3,200 mg Oral TID WC    Infusions:  heparin      Allergies  Allergen Reactions   Morphine And Codeine Hives   Levaquin [Levofloxacin] Itching    Severe itching; prickly sensation   Enalapril Other (See Comments)    hallucinations   Latex Rash   Tape Rash and Other (See Comments)    Social History   Socioeconomic History   Marital status: Single    Spouse name: Not on file   Number of children: Not on file   Years of education: Not on file   Highest education level: Not on file  Occupational History   Not on file  Tobacco Use   Smoking status: Some Days    Current packs/day: 0.25    Average packs/day: 0.3 packs/day for 20.0 years (5.0 ttl pk-yrs)    Types: Cigarettes   Smokeless tobacco: Never   Tobacco comments:    smokes two cigarettes a day  Vaping Use   Vaping status: Never Used  Substance and Sexual Activity   Alcohol use: No   Drug use: No   Sexual activity: Not Currently  Birth control/protection: Post-menopausal  Other Topics Concern   Not on file  Social History Narrative   Not on file   Social Determinants of Health   Financial Resource Strain: Not on file  Food Insecurity: Not on file  Transportation Needs: Not on file  Physical Activity: Not on file  Stress: Not on file  Social Connections: Not on file  Intimate Partner Violence: Not on file    Family History  Problem Relation Age of Onset   Hypertension Mother    Ovarian cancer Mother    Diabetes type II Mother    Hypertension Father    Kidney disease Father    Hypertension Sister    Diabetes type II Maternal Grandmother    Breast cancer Maternal Grandmother    Breast cancer Maternal Aunt    Hypertension Other    Cancer Other    Renal Disease Other     PHYSICAL  EXAM: Vitals:   05/17/23 1147 05/17/23 1149  BP:    Pulse:    Resp:    Temp:  97.6 F (36.4 C)  SpO2: 99%     No intake or output data in the 24 hours ending 05/17/23 1222  General:  Well appearing. No respiratory difficulty HEENT: normal Neck: supple. no JVD. Carotids 2+ bilat; no bruits. No lymphadenopathy or thryomegaly appreciated. Cor: PMI nondisplaced. Regular rate & rhythm. No rubs, gallops or murmurs. Lungs: clear Abdomen: soft, nontender, nondistended. No hepatosplenomegaly. No bruits or masses. Good bowel sounds. Extremities: no cyanosis, clubbing, rash, edema Neuro: alert & oriented x 3, cranial nerves grossly intact. moves all 4 extremities w/o difficulty. Affect pleasant.  ECG: Sinus bradycardia about 45 bpm with right bundle branch block and nonspecific ST-T changes with no ST elevation.  Results for orders placed or performed during the hospital encounter of 05/17/23 (from the past 24 hour(s))  CBC with Differential     Status: Abnormal   Collection Time: 05/17/23  7:47 AM  Result Value Ref Range   WBC 4.9 4.0 - 10.5 K/uL   RBC 3.72 (L) 3.87 - 5.11 MIL/uL   Hemoglobin 11.7 (L) 12.0 - 15.0 g/dL   HCT 54.0 98.1 - 19.1 %   MCV 99.7 80.0 - 100.0 fL   MCH 31.5 26.0 - 34.0 pg   MCHC 31.5 30.0 - 36.0 g/dL   RDW 47.8 29.5 - 62.1 %   Platelets 113 (L) 150 - 400 K/uL   nRBC 0.0 0.0 - 0.2 %   Neutrophils Relative % 63 %   Neutro Abs 3.1 1.7 - 7.7 K/uL   Lymphocytes Relative 22 %   Lymphs Abs 1.1 0.7 - 4.0 K/uL   Monocytes Relative 11 %   Monocytes Absolute 0.5 0.1 - 1.0 K/uL   Eosinophils Relative 3 %   Eosinophils Absolute 0.1 0.0 - 0.5 K/uL   Basophils Relative 0 %   Basophils Absolute 0.0 0.0 - 0.1 K/uL   Immature Granulocytes 1 %   Abs Immature Granulocytes 0.03 0.00 - 0.07 K/uL  Comprehensive metabolic panel     Status: Abnormal   Collection Time: 05/17/23  7:47 AM  Result Value Ref Range   Sodium 137 135 - 145 mmol/L   Potassium 5.0 3.5 - 5.1 mmol/L    Chloride 100 98 - 111 mmol/L   CO2 22 22 - 32 mmol/L   Glucose, Bld 102 (H) 70 - 99 mg/dL   BUN 64 (H) 6 - 20 mg/dL   Creatinine, Ser 3.08 (H) 0.44 - 1.00  mg/dL   Calcium 9.6 8.9 - 29.5 mg/dL   Total Protein 7.7 6.5 - 8.1 g/dL   Albumin 3.6 3.5 - 5.0 g/dL   AST 24 15 - 41 U/L   ALT 16 0 - 44 U/L   Alkaline Phosphatase 105 38 - 126 U/L   Total Bilirubin 0.8 <1.2 mg/dL   GFR, Estimated 5 (L) >60 mL/min   Anion gap 15 5 - 15  Troponin I (High Sensitivity)     Status: Abnormal   Collection Time: 05/17/23  7:47 AM  Result Value Ref Range   Troponin I (High Sensitivity) 75 (H) <18 ng/L  Heparin level (unfractionated)     Status: Abnormal   Collection Time: 05/17/23  7:47 AM  Result Value Ref Range   Heparin Unfractionated >1.10 (H) 0.30 - 0.70 IU/mL  APTT     Status: None   Collection Time: 05/17/23  7:47 AM  Result Value Ref Range   aPTT 30 24 - 36 seconds  Protime-INR     Status: Abnormal   Collection Time: 05/17/23  7:47 AM  Result Value Ref Range   Prothrombin Time 18.9 (H) 11.4 - 15.2 seconds   INR 1.6 (H) 0.8 - 1.2  Lactic acid, plasma     Status: None   Collection Time: 05/17/23  8:54 AM  Result Value Ref Range   Lactic Acid, Venous 0.9 0.5 - 1.9 mmol/L  Troponin I (High Sensitivity)     Status: Abnormal   Collection Time: 05/17/23 10:48 AM  Result Value Ref Range   Troponin I (High Sensitivity) 68 (H) <18 ng/L   DG Chest Portable 1 View  Result Date: 05/17/2023 CLINICAL DATA:  Shortness of breath and chest pain.  On dialysis. EXAM: PORTABLE CHEST 1 VIEW COMPARISON:  Chest radiographs 01/30/2022 and CT 02/16/2022 FINDINGS: A right jugular dialysis catheter terminates near the superior cavoatrial junction. Sequelae of CABG are again identified. The cardiac silhouette remains moderately to severely enlarged. There is mild central pulmonary vascular congestion without overt edema. Minimal left basilar opacity likely reflects atelectasis. No sizable pleural effusion or  pneumothorax is identified. No acute osseous abnormality is seen. IMPRESSION: Cardiomegaly and mild pulmonary vascular congestion. Electronically Signed   By: Sebastian Ache M.D.   On: 05/17/2023 08:26     ASSESSMENT AND PLAN: Patient has atypical chest pain with borderline troponin elevation and no acute ST changes on EKG.  Patient developed hypotension and bradycardia which may have contributed to hypoperfusion and chest pain.  No need for coronary intervention or cardiac catheterization at this time.  Once blood pressure and heart rate are reasonable can be discharged with follow-up in the office.  She had an appointment tomorrow to see me but if she cannot be discharged today I can see her end of the week on Thursday or Friday.  Thank you very much for referral.  Adrian Blackwater

## 2023-05-17 NOTE — ED Notes (Signed)
Pt to dialysis.

## 2023-05-17 NOTE — ED Notes (Signed)
MD Goodman at bedside. 

## 2023-05-17 NOTE — Progress Notes (Signed)
Received patient in bed to unit.    Informed consent signed and in chart.    TX duration: 3     Transported back to floor  Hand-off given to patient's nurse.   Access used:  rt chest cvc  Access issues: n/a  Total UF removed: 2500 mls Medication(s) given: Midodrine 10 mg po      Maple Hudson, RN Dialysis Unit

## 2023-05-17 NOTE — ED Provider Notes (Signed)
[facility] Provider Note    Event Date/Time   First MD Initiated Contact with Patient 05/17/23 [phone]     (approximate)   History   Hypotension   HPI  [name] is a 62 y.o. female  who presents to the ED via EMS from dialysis center for hypotension and chest pain. The chest pain is described as pressure.  Located in the center part of her chest.  Started while she was waiting for the Zenaida Niece to take her to dialysis.  She feels it is getting somewhat worse since it started.  It does radiate slightly into her right neck.  Patient has history of coronary disease and states this somewhat reminds her of when she has had an MI in the past.  Apparently when she arrived to dialysis they also noted the patient to be hypotensive.  The patient denies any recent illness.     Physical Exam   Triage Vital Signs: ED Triage Vitals  Encounter Vitals Group     BP 05/17/23 0735 (!) 81/59     Systolic BP Percentile --      Diastolic BP Percentile --      Pulse Rate 05/17/23 0735 (!) 52     Resp --      Temp 05/17/23 0735 97.9 F (36.6 C)     Temp Source 05/17/23 0735 Oral     SpO2 05/17/23 0735 100 %     Weight 05/17/23 0741 162 lb (73.5 kg)     Height 05/17/23 0741 5\' 6"  (1.676 m)     Head Circumference --      Peak Flow --      Pain Score 05/17/23 0736 7     Pain Loc --      Pain Education --      Exclude from Growth Chart --     Most recent vital signs: Vitals:   05/17/23 0735  BP: (!) 81/59  Pulse: (!) 52  Temp: 97.9 F (36.6 C)  SpO2: 100%   General: Awake, alert, oriented. CV:  Good peripheral perfusion. Bradycardia Resp:  Normal effort. Lungs clear. Abd:  No distention.    ED Results / Procedures / Treatments   Labs (all labs ordered are listed, but only abnormal results are displayed) Labs Reviewed  CBC WITH DIFFERENTIAL/PLATELET - Abnormal; Notable for the following components:      Result Value   RBC 3.72 (*)    Hemoglobin 11.7 (*)     Platelets 113 (*)    All other components within normal limits  COMPREHENSIVE METABOLIC PANEL - Abnormal; Notable for the following components:   Glucose, Bld 102 (*)    BUN 64 (*)    Creatinine, Ser 8.54 (*)    GFR, Estimated 5 (*)    All other components within normal limits  TROPONIN I (HIGH SENSITIVITY) - Abnormal; Notable for the following components:   Troponin I (High Sensitivity) 75 (*)    All other components within normal limits  LACTIC ACID, PLASMA  HEPARIN LEVEL (UNFRACTIONATED)  APTT  PROTIME-INR  TROPONIN I (HIGH SENSITIVITY)     EKG  I, Phineas Semen, attending physician, personally viewed and interpreted this EKG  EKG Time: 0744 Rate: 45 Rhythm: sinus bradycardia Axis: rightward axis Intervals: qtc 483 QRS: RBBB, LPFB ST changes: no st elevation Impression: abnormal ekg    RADIOLOGY I independently interpreted and visualized the CXR. My interpretation: cardiomegaly Radiology interpretation:  IMPRESSION:  Cardiomegaly and mild pulmonary vascular  congestion.     PROCEDURES:  Critical Care performed: Yes  CRITICAL CARE Performed by: Phineas Semen   Total critical care time: 35 minutes  Critical care time was exclusive of separately billable procedures and treating other patients.  Critical care was necessary to treat or prevent imminent or life-threatening deterioration.  Critical care was time spent personally by me on the following activities: development of treatment plan with patient and/or surrogate as well as nursing, discussions with consultants, evaluation of patient's response to treatment, examination of patient, obtaining history from patient or surrogate, ordering and performing treatments and interventions, ordering and review of laboratory studies, ordering and review of radiographic studies, pulse oximetry and re-evaluation of patient's condition.   Procedures    MEDICATIONS ORDERED IN ED: Medications - No data to  display   IMPRESSION / MDM / ASSESSMENT AND PLAN / ED COURSE  I reviewed the triage vital signs and the nursing notes.                              Differential diagnosis includes, but is not limited to, anemia, dehydration, infection, ACS  Patient's presentation is most consistent with acute presentation with potential threat to life or bodily function.   The patient is on the cardiac monitor to evaluate for evidence of arrhythmia and/or significant heart rate changes.  Patient presented to the emergency department today because of concerns for hypotension and chest pressure.  Patient was noted be hypotensive here in the emergency department.  Patient is afebrile and not tachycardic.  Will check blood work as well as chest x-ray.  EKG without ST elevation.  However will check troponin as well given cardiac history.  Patient's troponin was elevated to 75.  This does appear to be roughly double what her baseline has been in the past.  Patient was given aspirin and written for heparin drip.  I discussed with Dr. Welton Flakes with cardiology.  Additionally patient continued to be slightly hypotensive.  Because of this we will give small dose of IV fluids to see if that helps.  Discussed with Dr. Chipper Herb with the hospitalist service who will evaluate for admission.      FINAL CLINICAL IMPRESSION(S) / ED DIAGNOSES   Final diagnoses:  Chest pain, unspecified type  Elevated troponin  Bradycardia      Note:  This document was prepared using Dragon voice recognition software and may include unintentional dictation errors.    Phineas Semen, MD 05/17/23 1049

## 2023-05-17 NOTE — ED Triage Notes (Addendum)
Pt arrives via [facility] from [facility] where they had gone to get dialysis treatment this morning but was found to be hypotensive. Pt c/o of chest pain 8/10 and is SOB. Pt last dialysis tx was Friday. Is A&Ox4 during triage.

## 2023-05-17 NOTE — Consult Note (Signed)
PHARMACY - ANTICOAGULATION CONSULT NOTE  Pharmacy Consult for Heparin  Indication: chest pain/ACS  Allergies  Allergen Reactions   Morphine And Codeine Hives   Levaquin [Levofloxacin] Itching    Severe itching; prickly sensation   Enalapril Other (See Comments)    hallucinations   Latex Rash   Tape Rash and Other (See Comments)   Patient Measurements: Height: 5\' 6"  (167.6 cm) Weight: 73.5 kg (162 lb) IBW/kg (Calculated) : 59.3 Heparin Dosing Weight: 73.5 kg   Vital Signs: Temp: 97.8 F (36.6 C) (12/02 1841) Temp Source: Oral (12/02 1841) BP: 100/57 (12/02 2036) Pulse Rate: 74 (12/02 2036)  Labs: Recent Labs    05/17/23 0747 05/17/23 1048 05/17/23 2133  HGB 11.7*  --   --   HCT 37.1  --   --   PLT 113*  --   --   APTT 30  --  62*  LABPROT 18.9*  --   --   INR 1.6*  --   --   HEPARINUNFRC >1.10*  --   --   CREATININE 8.54*  --   --   TROPONINIHS 75* 68*  --    Estimated Creatinine Clearance: 7.2 mL/min (A) (by C-G formula based on SCr of 8.54 mg/dL (H)).  Medical History: Past Medical History:  Diagnosis Date   Adult behavior problems    frontal lobe CVA   Anemia    chronic disease   Atrial flutter by electrocardiogram (HCC) 03/22/2014   Cardiomyopathy (HCC)    CHF (congestive heart failure) (HCC)    Chicken pox    Chronic respiratory failure with hypoxia (HCC) 12/19/2021   Coronary artery disease    Delayed surgical wound healing 04/12/2014   Overview:  Left leg   GERD (gastroesophageal reflux disease)    H/O atrial flutter 04/12/2014   H/O stroke without residual deficits 02/13/2014   H/O: substance abuse (HCC) 02/27/2014   Heart failure (HCC)    Hepatitis    history of hep c   History of CVA (cerebrovascular accident) 02/21/2014   History of delirium 04/12/2014   Hyperkalemia 11/13/2014   Hyperlipidemia    Hypertension    Hypotension 02/24/2014   Leukocytosis 02/26/2014   Myocardial infarction (HCC)    Obesity    Paroxysmal atrial  fibrillation (HCC)    Peripheral vascular disease (HCC)    Postoperative anemia due to acute blood loss 02/27/2014   Renal failure    Renal insufficiency    Stroke [facility]) 2011   Superficial incisional surgical site infection 03/14/2014   Syncope and collapse 07/25/2020   Medications:  PTA Eliquis - last dose taken 12/01 PM at 2100 per patient   Assessment: [name] is a 62 year female that presented with hypotension and chest pain following her dialysis session this morning. Patient's troponin elevated to 75. Per patient, she takes Eliquis at home and last dose was taken last night around 2100. Pharmacy has been consulted for management of a heparin infusion. Baseline labs: Hgb 11.7, PLT 113, aPTT, HL, and PT/INR pending. No signs/symptoms of bleeding noted.   Goal of Therapy:  Heparin level 0.3-0.7 units/ml Monitor platelets by anticoagulation protocol: Yes   Plan:  12/2:  aPTT @ 2133 = 62, SUBtherapeutic - Will order heparin 1100 units IV X 1 bolus and increase drip rate to 1050 units/hr - Will recheck aPTT and HL 8 hrs after rate change  Will monitor aPTT levels until correlation with anti-Xa levels    Monitor CBC daily while on heparin  Scherrie Gerlach, PharmD Pharmacy Resident  05/17/2023 10:18 PM

## 2023-05-17 NOTE — ED Notes (Signed)
Pt ate 100% of lunch. Pt is calm and cooperative. Family at bedside. Pt si sitting upright and said headache has improved.

## 2023-05-17 NOTE — Progress Notes (Signed)
[facility]  ROUNDING NOTE   Subjective:   [name] is a 62 year old African-American female with past medical histories including CAD with CABG and end-stage renal disease on dialysis. Patient presents to the emergency department with complaints of chest pain and low BP. She has been admitted for NSTEMI (non-ST elevated myocardial infarction) [facility]) [I21.4]  Patient is known to our clinic and receives outpatient dialysis treatments at [facility] on MWF schedule, supervised by Dr. Thedore Mins. Denies missing any recent treatments. States she arrived to her treatment this morning with a low blood pressure. States she began having chest pain as well. She has not received any dialysis. Currently seen sitting up on stretcher, eating breakfast. Denies shortness of breath, room air. Reporting chest pain while sitting up.   Labs on ED arrival consisted of potassium 5.0, glucose 102, BUN 64, creatinine 8.54 with GFR 5, troponin 75, and hemoglobin 11.7.  Chest x-ray shows mild pulmonary vascular congestion.  Echo shows EF 25 to 30% with a left ventricle will be hypokinesis, grade 2 diastolic dysfunction, severely enlarged right ventricle, bilateral atrial dilation, mild to moderate MVR, moderate TVR, and aortic valve calcification.  We have been consulted to provide dialysis during this admission.  Objective:  Vital signs in last 24 hours:  Temp:  [97.6 F (36.4 C)-97.9 F (36.6 C)] 97.6 F (36.4 C) (12/02 1149) Pulse Rate:  [44-56] 56 (12/02 1130) Resp:  [13-18] 14 (12/02 1100) BP: (80-104)/(55-76) 103/76 (12/02 1100) SpO2:  [98 %-100 %] 99 % (12/02 1147) Weight:  [73.5 kg] 73.5 kg (12/02 0741)  Weight change:  Filed Weights   05/17/23 0741  Weight: 73.5 kg    Intake/Output: No intake/output data recorded.   Intake/Output this shift:  No intake/output data recorded.  Physical Exam: General: NAD  Head: Normocephalic, atraumatic. Moist oral mucosal membranes   Eyes: Anicteric  Lungs:  Clear to auscultation, normal effort  Heart: Regular rate and rhythm  Abdomen:  Soft, nontender  Extremities: No peripheral edema.  Neurologic: Alert and oriented, moving all four extremities  Skin: No lesions  Access: Left chest PermCath    Basic Metabolic Panel: Recent Labs  Lab 05/17/23 0747  NA 137  K 5.0  CL 100  CO2 22  GLUCOSE 102*  BUN 64*  CREATININE 8.54*  CALCIUM 9.6    Liver Function Tests: Recent Labs  Lab 05/17/23 0747  AST 24  ALT 16  ALKPHOS 105  BILITOT 0.8  PROT 7.7  ALBUMIN 3.6   No results for input(s): "LIPASE", "AMYLASE" in the last 168 hours. No results for input(s): "AMMONIA" in the last 168 hours.  CBC: Recent Labs  Lab 05/17/23 0747  WBC 4.9  NEUTROABS 3.1  HGB 11.7*  HCT 37.1  MCV 99.7  PLT 113*    Cardiac Enzymes: No results for input(s): "CKTOTAL", "CKMB", "CKMBINDEX", "TROPONINI" in the last 168 hours.  BNP: Invalid input(s): "POCBNP"  CBG: No results for input(s): "GLUCAP" in the last 168 hours.  Microbiology: Results for orders placed or performed in visit on 03/25/23  Aerobic culture     Status: None   Collection Time: 03/25/23 10:27 AM  Result Value Ref Range Status   Aerobic Bacterial Culture Final report  Final   Organism ID, Bacteria Mixed skin flora  Final    Comment: Scant growth    Coagulation Studies: Recent Labs    05/17/23 0747  LABPROT 18.9*  INR 1.6*    Urinalysis: No results for input(s): "COLORURINE", "LABSPEC", "  PHURINE", "GLUCOSEU", "HGBUR", "BILIRUBINUR", "KETONESUR", "PROTEINUR", "UROBILINOGEN", "NITRITE", "LEUKOCYTESUR" in the last 72 hours.  Invalid input(s): "APPERANCEUR"    Imaging: ECHOCARDIOGRAM COMPLETE  Result Date: 05/17/2023    ECHOCARDIOGRAM REPORT   Patient Name:   [name] [facility] Date of Exam: 05/17/2023 Medical Rec #:  [id number]         Height:       66.0 in Accession #:    [id number]        Weight:       162.0 lb Date of Birth:  [birth year]           BSA:          1.828 m Patient Age:    62 years          BP:           103/76 mmHg Patient Gender: F                 HR:           51 bpm. Exam Location:  [facility] Procedure: 2D Echo, 3D Echo, Cardiac Doppler, Color Doppler and Strain Analysis Indications:     NSTEMI  History:         Patient has prior history of Echocardiogram examinations, most                  recent 12/22/2021. CHF, Acute MI and Angina, Prior CABG,                  Arrythmias:Atrial Fibrillation; Risk Factors:Hypertension,                  Dyslipidemia and Former Smoker. ESRD on Dialysis.  Sonographer:     Mikki Harbor Referring Phys:  [id number] Emeline General Diagnosing Phys: Adrian Blackwater  Sonographer Comments: Global longitudinal strain was attempted. IMPRESSIONS  1. Left ventricular ejection fraction, by estimation, is 25 to 30%. The left ventricle has severely decreased function. The left ventricle demonstrates global hypokinesis. The left ventricular internal cavity size was severely dilated. There is moderate  left ventricular hypertrophy. Left ventricular diastolic parameters are consistent with Grade II diastolic dysfunction (pseudonormalization).  2. Right ventricular systolic function is severely reduced. The right ventricular size is severely enlarged. Mildly increased right ventricular wall thickness. There is severely elevated pulmonary artery systolic pressure.  3. Left atrial size was severely dilated.  4. Right atrial size was mildly dilated.  5. The mitral valve is grossly normal. Mild to moderate mitral valve regurgitation.  6. Tricuspid valve regurgitation is moderate.  7. The aortic valve is calcified. Aortic valve regurgitation is not visualized. FINDINGS  Left Ventricle: Left ventricular ejection fraction, by estimation, is 25 to 30%. The left ventricle has severely decreased function. The left ventricle demonstrates global hypokinesis. The left ventricular internal cavity size was severely dilated. There is moderate left  ventricular hypertrophy. Left ventricular diastolic parameters are consistent with Grade II diastolic dysfunction (pseudonormalization). Right Ventricle: The right ventricular size is severely enlarged. Mildly increased right ventricular wall thickness. Right ventricular systolic function is severely reduced. There is severely elevated pulmonary artery systolic pressure. The tricuspid regurgitant velocity is 3.80 m/s, and with an assumed right atrial pressure of 15 mmHg, the estimated right ventricular systolic pressure is 72.8 mmHg. Left Atrium: Left atrial size was severely dilated. Right Atrium: Right atrial size was mildly dilated. Pericardium: There is no evidence of pericardial effusion. Mitral Valve: The mitral valve is grossly normal. Mild to moderate mitral valve regurgitation. MV  peak gradient, 4.8 mmHg. The mean mitral valve gradient is 1.0 mmHg. Tricuspid Valve: The tricuspid valve is grossly normal. Tricuspid valve regurgitation is moderate. Aortic Valve: The aortic valve is calcified. Aortic valve regurgitation is not visualized. Aortic valve mean gradient measures 4.5 mmHg. Aortic valve peak gradient measures 9.7 mmHg. Aortic valve area, by VTI measures 2.21 cm. Pulmonic Valve: The pulmonic valve was grossly normal. Pulmonic valve regurgitation is not visualized. Aorta: The aortic root, ascending aorta and aortic arch are all structurally normal, with no evidence of dilitation or obstruction. IAS/Shunts: No atrial level shunt detected by color flow Doppler.  LEFT VENTRICLE PLAX 2D LVIDd:         6.70 cm      Diastology LVIDs:         5.80 cm      LV e' lateral:   8.68 cm/s LV PW:         0.90 cm      LV E/e' lateral: 11.4 LV IVS:        1.40 cm LVOT diam:     2.10 cm LV SV:         67 LV SV Index:   36 LVOT Area:     3.46 cm  LV Volumes (MOD) LV vol d, MOD A2C: 204.0 ml LV vol d, MOD A4C: 167.0 ml LV vol s, MOD A2C: 141.0 ml LV vol s, MOD A4C: 125.0 ml LV SV MOD A2C:     63.0 ml LV SV MOD A4C:      167.0 ml LV SV MOD BP:      46.6 ml RIGHT VENTRICLE RV Basal diam:  4.15 cm RV Mid diam:    3.10 cm RV S prime:     7.05 cm/s TAPSE (M-mode): 1.5 cm LEFT ATRIUM              Index        RIGHT ATRIUM           Index LA diam:        5.60 cm  3.06 cm/m   RA Area:     33.30 cm LA Vol (A2C):   84.7 ml  46.32 ml/m  RA Volume:   132.00 ml 72.19 ml/m LA Vol (A4C):   107.0 ml 58.52 ml/m LA Biplane Vol: 96.9 ml  52.99 ml/m  AORTIC VALVE                    PULMONIC VALVE AV Area (Vmax):    2.10 cm     PV Vmax:       0.80 m/s AV Area (Vmean):   1.91 cm     PV Peak grad:  2.6 mmHg AV Area (VTI):     2.21 cm AV Vmax:           155.50 cm/s AV Vmean:          99.300 cm/s AV VTI:            0.301 m AV Peak Grad:      9.7 mmHg AV Mean Grad:      4.5 mmHg LVOT Vmax:         94.30 cm/s LVOT Vmean:        54.800 cm/s LVOT VTI:          0.192 m LVOT/AV VTI ratio: 0.64  AORTA Ao Root diam: 3.20 cm MITRAL VALVE  TRICUSPID VALVE MV Area (PHT): 6.17 cm    TR Peak grad:   57.8 mmHg MV Area VTI:   1.75 cm    TR Vmax:        380.00 cm/s MV Peak grad:  4.8 mmHg MV Mean grad:  1.0 mmHg    SHUNTS MV Vmax:       1.10 m/s    Systemic VTI:  0.19 m MV Vmean:      45.2 cm/s   Systemic Diam: 2.10 cm MV Decel Time: 123 msec MV E velocity: 99.10 cm/s MV A velocity: 35.50 cm/s MV E/A ratio:  2.79 Shaukat Khan Electronically signed by Adrian Blackwater Signature Date/Time: 05/17/2023/2:04:57 PM    Final    DG Chest Portable 1 View  Result Date: 05/17/2023 CLINICAL DATA:  Shortness of breath and chest pain.  On dialysis. EXAM: PORTABLE CHEST 1 VIEW COMPARISON:  Chest radiographs 01/30/2022 and CT 02/16/2022 FINDINGS: A right jugular dialysis catheter terminates near the superior cavoatrial junction. Sequelae of CABG are again identified. The cardiac silhouette remains moderately to severely enlarged. There is mild central pulmonary vascular congestion without overt edema. Minimal left basilar opacity likely reflects atelectasis. No  sizable pleural effusion or pneumothorax is identified. No acute osseous abnormality is seen. IMPRESSION: Cardiomegaly and mild pulmonary vascular congestion. Electronically Signed   By: Sebastian Ache M.D.   On: 05/17/2023 08:26     Medications:    heparin 900 Units/hr (05/17/23 1242)    [START ON 05/18/2023] Chlorhexidine Gluconate Cloth  6 each Topical Q0600   hydrOXYzine  50 mg Oral QHS   levothyroxine  75 mcg Oral Q0600   [START ON 05/19/2023] midodrine  10 mg Oral Q M,W,F-HD   pantoprazole  40 mg Oral Daily   patiromer  1 packet Oral Daily   ranolazine  500 mg Oral BID   rosuvastatin  20 mg Oral QHS   sevelamer carbonate  3,200 mg Oral TID WC   acetaminophen, albuterol, heparin, lidocaine-prilocaine, nitroGLYCERIN, ondansetron (ZOFRAN) IV, oxyCODONE-acetaminophen, pentafluoroprop-tetrafluoroeth  Assessment/ Plan:  [name] is a 62 y.o.  female with past medical histories including CAD with CABG and end-stage renal disease on dialysis. Patient presents to the emergency department with complaints of chest pain and low BP. She has been admitted for Bradycardia [R00.1] Elevated troponin [R79.89] NSTEMI (non-ST elevated myocardial infarction) (HCC) [I21.4] Chest pain, unspecified type [R07.9]   End-stage renal disease on hemodialysis.  No missed recent treatments. Will provide dialysis treatment today, as patient is 4.9kg over dry weight. Will attempt to remove 2.5kg depending on blood pressure. Receives Will assess need for dialysis tomorrow.   2. Anemia of chronic kidney disease Lab Results  Component Value Date   HGB 11.7 (L) 05/17/2023    Hgb within desired range. Mircera received at outpatient clinic. Will continue to monitor.   3. Secondary Hyperparathyroidism: with outpatient labs: PTH 417, phosphorus 5.5, calcium 9.4 on 04/26/23.   Lab Results  Component Value Date   PTH 627 (H) 07/26/2020   CALCIUM 9.6 05/17/2023   CAION 0.58 (LL) 04/22/2021   PHOS 3.4  02/16/2022    Prescribed calcitriol and sevelamer outpatient. Will continue to monitor bone minerals.   4. Atypical chest pain, Troponin 75-68. No ST changes on EKG. Cardiology consulted and feels hypotension and bradycardia produced chest pain from hypoperfusion. Will follow up outpatient.    LOS: 0 Jaiden Dinkins 12/2/20242:15 PM

## 2023-05-17 NOTE — H&P (Signed)
History and Physical    [name] WUJ:[id number] DOB: [birth year] DOA: 05/17/2023  PCP: Miki Kins, FNP (Confirm with patient/family/NH records and if not entered, this has to be entered at [facility] point of entry) Patient coming from: Home  I have personally briefly reviewed patient's old medical records in [facility]  Chief Complaint: Chest pain  HPI: [name] is a 62 y.o. female with medical history significant of CAD status post CABG x 3 with chronic stable angina, ESRD on HD MWF, HD associated hypotension on midodrine, chronic HFrEF with LVEF 20-25%, PAF and PVD on Eliquis, sent from HD center for evaluation of new onset chest pain.  Patient woke up this morning feeling lightheadedness and shortness of breath.  She went to her regular HD and was found to be hypotensive.  Soon after, patient started to feel pressure-like chest pain 7-8/10 associated with worsening of shortness of breath.  Patient reported that her BP/CHF medication was adjusted recently with losartan added to " straightening of the heart" about 1 month ago.  Otherwise she denied chronic chest pain or exertional dyspnea.  Chest pain subsided after given aspirin x 3 in the ED.  Patient estimated chest pain lasted 20 to 33 minutes in total.  ED Course: Bradycardia heart rate 40-50, blood pressure 80/50 on arrival responded to small IV bolus 500 mL blood pressure improved to 103/70.  Chest x-ray showed cardiomegaly and pulmonary congestion.  Blood work showed creatinine 8.5, BUN 64, troponin 75.  EKG showed sinus bradycardia and no acute ST changes.  The patient was started on heparin drip and cardiology consulted.  Review of Systems: As per HPI otherwise 14 point review of systems negative.    Past Medical History:  Diagnosis Date   Adult behavior problems    frontal lobe CVA   Anemia    chronic disease   Atrial flutter by electrocardiogram (HCC) 03/22/2014   Cardiomyopathy (HCC)    CHF  (congestive heart failure) (HCC)    Chicken pox    Chronic respiratory failure with hypoxia (HCC) 12/19/2021   Coronary artery disease    Delayed surgical wound healing 04/12/2014   Overview:  Left leg   GERD (gastroesophageal reflux disease)    H/O atrial flutter 04/12/2014   H/O stroke without residual deficits 02/13/2014   H/O: substance abuse (HCC) 02/27/2014   Heart failure (HCC)    Hepatitis    history of hep c   History of CVA (cerebrovascular accident) 02/21/2014   History of delirium 04/12/2014   Hyperkalemia 11/13/2014   Hyperlipidemia    Hypertension    Hypotension 02/24/2014   Leukocytosis 02/26/2014   Myocardial infarction [facility])    Obesity    Paroxysmal atrial fibrillation (HCC)    Peripheral vascular disease (HCC)    Postoperative anemia due to acute blood loss 02/27/2014   Renal failure    Renal insufficiency    Stroke [facility]) 2011   Superficial incisional surgical site infection 03/14/2014   Syncope and collapse 07/25/2020    Past Surgical History:  Procedure Laterality Date   A/V FISTULAGRAM Left 03/30/2019   Procedure: A/V FISTULAGRAM;  Surgeon: Annice Needy, MD;  Location: [facility] INVASIVE CV LAB;  Service: Cardiovascular;  Laterality: Left;   A/V FISTULAGRAM Left 10/06/2019   Procedure: A/V FISTULAGRAM;  Surgeon: Annice Needy, MD;  Location: [facility] INV[facility] CV [facility];  Service: Cardiovascular;  Laterality: Left;   A/V FISTULAGRAM Left 05/19/2021   Procedure: A/V FISTULAGRAM;  Surgeon: Festus Barren  S, MD;  Location: [facility] CV [facility];  Service: Cardiovascular;  Laterality: Left;   A/V FISTULAGRAM Left 01/29/2022   Procedure: A/V Fistulagram;  Surgeon: Annice Needy, MD;  Location: [facility] CV [facility];  Service: Cardiovascular;  Laterality: Left;   A/V FISTULAGRAM Left 04/30/2022   Procedure: A/V Fistulagram;  Surgeon: Annice Needy, MD;  Location: [facility] CV [facility];  Service: Cardiovascular;  Laterality: Left;   A/V FISTULAGRAM Left 08/20/2022   Procedure: A/V  Fistulagram;  Surgeon: Annice Needy, MD;  Location: [facility];  Service: Cardiovascular;  Laterality: Left;   A/V FISTULAGRAM N/A 12/03/2022   Procedure: A/V Fistulagram;  Surgeon: Annice Needy, MD;  Location: [facility] CV [facility];  Service: Cardiovascular;  Laterality: N/A;   A/V FISTULAGRAM Left 02/11/2023   Procedure: A/V Fistulagram;  Surgeon: Annice Needy, MD;  Location: [facility] CV [facility];  Service: Cardiovascular;  Laterality: Left;   COLONOSCOPY     COLONOSCOPY WITH PROPOFOL N/A 04/22/2021   Procedure: COLONOSCOPY WITH PROPOFOL;  Surgeon: Regis Bill, MD;  Location: [facility] ENDOSCOPY;  Service: Endoscopy;  Laterality: N/A;   CORONARY ANGIOPLASTY WITH STENT PLACEMENT  2013   CORONARY ARTERY BYPASS GRAFT     DIALYSIS FISTULA CREATION     DIALYSIS/PERMA CATHETER INSERTION N/A 04/08/2023   Procedure: DIALYSIS/PERMA CATHETER INSERTION;  Surgeon: Annice Needy, MD;  Location: [facility]VAS[facility] CV LAB;  Service: Cardiovascular;  Laterality: N/A;   ESOPHAGOGASTRODUODENOSCOPY     ESOPHAGOGASTRODUODENOSCOPY N/A 04/22/2021   Procedure: ESOPHAGOGASTRODUODENOSCOPY (EGD);  Surgeon: Regis Bill, MD;  Location: [facility] ENDOSCOPY;  Service: Endoscopy;  Laterality: N/A;   FLEXIBLE SIGMOIDOSCOPY N/A 02/23/2019   Procedure: FLEXIBLE SIGMOIDOSCOPY;  Surgeon: Christena Deem, MD;  Location: [facility] ENDOSCOPY;  Service: Endoscopy;  Laterality: N/A;   LEFT HEART CATH AND CORS/GRAFTS ANGIOGRAPHY N/A 08/08/2018   Procedure: LEFT HEART CATH AND CORS/GRAFTS ANGIOGRAPHY;  Surgeon: Laurier Nancy, MD;  Location: [facility]VAS[facility] CV LAB;  Service: Cardiovascular;  Laterality: N/A;   LEFT HEART CATH AND CORS/GRAFTS ANGIOGRAPHY N/A 01/30/2019   Procedure: LEFT HEART CATH AND CORS/GRAFTS ANGIOGRAPHY;  Surgeon: Laurier Nancy, MD;  Location: [facility] INV[facility] CV LAB;  Service: Cardiovascular;  Laterality: N/A;   percutaneous insertion intra aortic balloon right cath     ultrasound guided pericardiocentesis        reports that she has been smoking cigarettes. She has a 5 pack-year smoking history. She has never used smokeless tobacco. She reports that she does not drink alcohol and does not use drugs.  Allergies  Allergen Reactions   Morphine And Codeine Hives   Levaquin [Levofloxacin] Itching    Severe itching; prickly sensation   Enalapril Other (See Comments)    hallucinations   Latex Rash   Tape Rash and Other (See Comments)    Family History  Problem Relation Age of Onset   Hypertension Mother    Ovarian cancer Mother    Diabetes type II Mother    Hypertension Father    Kidney disease Father    Hypertension Sister    Diabetes type II Maternal Grandmother    Breast cancer Maternal Grandmother    Breast cancer Maternal Aunt    Hypertension Other    Cancer Other    Renal Disease Other      Prior to Admission medications   Medication Sig Start Date End Date Taking? Authorizing Provider  albuterol (VENTOLIN HFA) 108 (90 Base) MCG/ACT inhaler Inhale 1-2 puffs into the lungs every  6 (six) hours as needed for wheezing or shortness of breath. 01/07/23  Yes Miki Kins, FNP  amiodarone (PACERONE) 200 MG tablet Take 200 mg by mouth daily. 07/30/20  Yes [provider]  apixaban (ELIQUIS) 5 MG TABS tablet Take 5 mg by mouth 2 (two) times daily. 09/11/19  Yes [provider]  hydrOXYzine (ATARAX/VISTARIL) 50 MG tablet Take 50 mg by mouth at bedtime.   Yes [provider]  levothyroxine (SYNTHROID) 75 MCG tablet Take 75 mcg by mouth daily before breakfast.  11/02/18  Yes [provider]  losartan (COZAAR) 25 MG tablet Take 1 tablet (25 mg total) by mouth daily. 04/20/23  Yes Laurier Nancy, MD  midodrine (PROAMATINE) 10 MG tablet Take 1 tablet (10 mg total) by mouth 3 (three) times a week. Monday, Wednesday, Friday with hemodialysis 12/24/21  Yes Pokhrel, Laxman, MD  omeprazole (PRILOSEC) 20 MG capsule Take 20 mg by mouth daily. 12/02/22  Yes [provider]  oxyCODONE-acetaminophen (PERCOCET/ROXICET) 5-325 MG tablet Take 1 tablet by mouth every 6 (six) hours as needed for severe pain (pain score 7-10). 04/06/23  Yes Miki Kins, FNP  pantoprazole (PROTONIX) 40 MG tablet Take 40 mg by mouth daily.   Yes [provider]  ranolazine (RANEXA) 1000 MG SR tablet Take 1,000 mg by mouth 2 (two) times daily. 03/18/23  Yes [provider]  rosuvastatin (CRESTOR) 20 MG tablet Take 20 mg by mouth at bedtime. 11/07/21  Yes [provider]  sevelamer carbonate (RENVELA) 800 MG tablet Take 3,200 mg by mouth 3 (three) times daily with meals.   Yes [provider]  Bismuth Tribromoph-Petrolatum (XEROFORM PETROLAT PATCH 2"X2") PADS Apply 1 each topically every 3 (three) days. 12/23/22   Miki Kins, FNP  doxycycline (VIBRAMYCIN) 100 MG capsule Take 1 capsule (100 mg total) by mouth 2 (two) times daily. Patient not taking: Reported on 05/17/2023 03/25/23   Georgiana Spinner, NP  lidocaine-prilocaine (EMLA) cream Apply 1 application topically daily as needed (port access).    [provider]  VELTASSA 8.4 g packet Take 1 packet by mouth daily. 01/05/23   [provider]    Physical Exam: Vitals:   05/17/23 1100 05/17/23 1130 05/17/23 1147 05/17/23 1149  BP: 103/76     Pulse: (!) 44 (!) 56    Resp: 14     Temp:    97.6 F (36.4 C)  TempSrc:    Oral  SpO2: 100%  99%   Weight:      Height:        Constitutional: NAD, calm, comfortable Vitals:   05/17/23 1100 05/17/23 1130 05/17/23 1147 05/17/23 1149  BP: 103/76     Pulse: (!) 44 (!) 56    Resp: 14     Temp:    97.6 F (36.4 C)  TempSrc:    Oral  SpO2: 100%  99%   Weight:      Height:       Eyes: PERRL, lids and conjunctivae normal ENMT: Mucous membranes are moist. Posterior pharynx clear of any exudate or lesions.Normal dentition.  Neck: normal, supple, no masses, no thyromegaly Respiratory: clear to auscultation bilaterally,  no wheezing, no crackles. Normal respiratory effort. No accessory muscle use.  Cardiovascular: Regular rate and rhythm, no murmurs / rubs / gallops. No extremity edema. 2+ pedal pulses. No carotid bruits.  Abdomen: no tenderness, no masses palpated. No hepatosplenomegaly. Bowel sounds positive.  Musculoskeletal: no clubbing / cyanosis. No joint  deformity upper and lower extremities. Good ROM, no contractures. Normal muscle tone.  Skin: no rashes, lesions, ulcers. No induration Neurologic: CN 2-12 grossly intact. Sensation intact, DTR normal. Strength 5/5 in all 4.  Psychiatric: Normal judgment and insight. Alert and oriented x 3. Normal mood.     Labs on Admission: I have personally reviewed following labs and imaging studies  CBC: Recent Labs  Lab 05/17/23 0747  WBC 4.9  NEUTROABS 3.1  HGB 11.7*  HCT 37.1  MCV 99.7  PLT 113*   Basic Metabolic Panel: Recent Labs  Lab 05/17/23 0747  NA 137  K 5.0  CL 100  CO2 22  GLUCOSE 102*  BUN 64*  CREATININE 8.54*  CALCIUM 9.6   GFR: Estimated Creatinine Clearance: 7.2 mL/min (A) (by C-G formula based on SCr of 8.54 mg/dL (H)). Liver Function Tests: Recent Labs  Lab 05/17/23 0747  AST 24  ALT 16  ALKPHOS 105  BILITOT 0.8  PROT 7.7  ALBUMIN 3.6   No results for input(s): "LIPASE", "AMYLASE" in the last 168 hours. No results for input(s): "AMMONIA" in the last 168 hours. Coagulation Profile: Recent Labs  Lab 05/17/23 0747  INR 1.6*   Cardiac Enzymes: No results for input(s): "CKTOTAL", "CKMB", "CKMBINDEX", "TROPONINI" in the last 168 hours. BNP (last 3 results) No results for input(s): "PROBNP" in the last 8760 hours. HbA1C: No results for input(s): "HGBA1C" in the last 72 hours. CBG: No results for input(s): "GLUCAP" in the last 168 hours. Lipid Profile: No results for input(s): "CHOL", "HDL", "LDLCALC", "TRIG", "CHOLHDL", "LDLDIRECT" in the last 72 hours. Thyroid Function Tests: No results for input(s): "TSH",  "T4TOTAL", "FREET4", "T3FREE", "THYROIDAB" in the last 72 hours. Anemia Panel: No results for input(s): "VITAMINB12", "FOLATE", "FERRITIN", "TIBC", "IRON", "RETICCTPCT" in the last 72 hours. Urine analysis:    Component Value Date/Time   COLORURINE Yellow 09/14/2012 0414   APPEARANCEUR Cloudy 09/14/2012 0414   LABSPEC 1.018 09/14/2012 0414   PHURINE 9.0 09/14/2012 0414   GLUCOSEU 50 mg/dL 16/03/9603 5409   HGBUR 1+ 09/14/2012 0414   BILIRUBINUR Negative 09/14/2012 0414   KETONESUR Negative 09/14/2012 0414   PROTEINUR >=500 09/14/2012 0414   NITRITE Negative 09/14/2012 0414   LEUKOCYTESUR 2+ 09/14/2012 0414    Radiological Exams on Admission: DG Chest Portable 1 View  Result Date: 05/17/2023 CLINICAL DATA:  Shortness of breath and chest pain.  On dialysis. EXAM: PORTABLE CHEST 1 VIEW COMPARISON:  Chest radiographs 01/30/2022 and CT 02/16/2022 FINDINGS: A right jugular dialysis catheter terminates near the superior cavoatrial junction. Sequelae of CABG are again identified. The cardiac silhouette remains moderately to severely enlarged. There is mild central pulmonary vascular congestion without overt edema. Minimal left basilar opacity likely reflects atelectasis. No sizable pleural effusion or pneumothorax is identified. No acute osseous abnormality is seen. IMPRESSION: Cardiomegaly and mild pulmonary vascular congestion. Electronically Signed   By: Sebastian Ache M.D.   On: 05/17/2023 08:26    EKG: Independently reviewed.  Sinus rhythm, no acute ST changes.  Assessment/Plan Principal Problem:   NSTEMI (non-ST elevated myocardial infarction) (HCC) Active Problems:   ESRD on hemodialysis (HCC)   S/P CABG x 3   Unstable angina (HCC)  (please populate well all problems here in Problem List. (For example, if patient is on BP meds at home and you resume or decide to hold them, it is a problem that needs to be her. Same for CAD, COPD, HLD and so on)  Unstable angina vs NSTEMI -New onset  chest pain, cardiac chest pain like -Review of patient record showed most recent stress test 2 months ago showed large severe fixed partially reversible basal and mid inferior and anterior lateral wall fixed basal and mid apical inferior wall defect.  Most recent cardiac cath in 2020 showed overall CABG graft patent.  ED physician discussed case with cardiology Dr. Welton Flakes who will see the patient emergently this morning. -Plan to continue ACS medications including heparin drip -Echocardiogram -BP too low to allow nitroglycerin  Chronic HFrEF -Appears to be euvolemic -D/W on-call nephrology, currently, we will focus on treating angina/NSTEMI, reevaluate her labs and volume status and the HD tonight versus tomorrow morning.  Nephrology agreed.  Borderline hypotension Borderline bradycardia -Suspect the changes are related to unstable angina and MI -Cardiology aware -Off all BP/CHF medication including losartan  PAF -Hold off amiodarone for hypotension and bradycardia -Change Eliquis to heparin drip to treat unstable angina/MI  ESRD on HD -As above  DVT prophylaxis: Heparin drip Code Status: Full code Family Communication: None at bedside Disposition Plan: Patient is sick with unstable angina/NSTEMI with known history of CAD CABG, requiring inpatient cardiology workup, expect more than 2 midnight hospital stay Consults called: Cardiology Dr. Welton Flakes Admission status: PCU admit   Emeline General MD [facility] Pager [phone]  05/17/2023, 12:11 PM

## 2023-05-18 ENCOUNTER — Ambulatory Visit: Payer: 59 | Admitting: Cardiovascular Disease

## 2023-05-18 DIAGNOSIS — Z951 Presence of aortocoronary bypass graft: Secondary | ICD-10-CM

## 2023-05-18 DIAGNOSIS — I200 Unstable angina: Secondary | ICD-10-CM | POA: Diagnosis not present

## 2023-05-18 DIAGNOSIS — R079 Chest pain, unspecified: Secondary | ICD-10-CM

## 2023-05-18 LAB — RENAL FUNCTION PANEL
Albumin: 3.4 g/dL — ABNORMAL LOW (ref 3.5–5.0)
Anion gap: 12 (ref 5–15)
BUN: 47 mg/dL — ABNORMAL HIGH (ref 6–20)
CO2: 26 mmol/L (ref 22–32)
Calcium: 8.7 mg/dL — ABNORMAL LOW (ref 8.9–10.3)
Chloride: 98 mmol/L (ref 98–111)
Creatinine, Ser: 6.5 mg/dL — ABNORMAL HIGH (ref 0.44–1.00)
GFR, Estimated: 7 mL/min — ABNORMAL LOW (ref >60)
Glucose, Bld: 117 mg/dL — ABNORMAL HIGH (ref 70–99)
Phosphorus: 4.2 mg/dL (ref 2.5–4.6)
Potassium: 4.2 mmol/L (ref 3.5–5.1)
Sodium: 136 mmol/L (ref 135–145)

## 2023-05-18 LAB — CBC
HCT: 32.8 % — ABNORMAL LOW (ref 36.0–46.0)
HCT: 32.4 % — ABNORMAL LOW (ref 36.0–46.0)
Hemoglobin: 10.7 g/dL — ABNORMAL LOW (ref 12.0–15.0)
Hemoglobin: 10.4 g/dL — ABNORMAL LOW (ref 12.0–15.0)
MCH: 31.6 pg (ref 26.0–34.0)
MCH: 31.6 pg (ref 26.0–34.0)
MCHC: 32.6 g/dL (ref 30.0–36.0)
MCHC: 32.1 g/dL (ref 30.0–36.0)
MCV: 96.8 fL (ref 80.0–100.0)
MCV: 98.5 fL (ref 80.0–100.0)
Platelets: 116 10*3/uL — ABNORMAL LOW (ref 150–400)
Platelets: 117 10*3/uL — ABNORMAL LOW (ref 150–400)
RBC: 3.39 MIL/uL — ABNORMAL LOW (ref 3.87–5.11)
RBC: 3.29 MIL/uL — ABNORMAL LOW (ref 3.87–5.11)
RDW: 15.1 % (ref 11.5–15.5)
RDW: 15.2 % (ref 11.5–15.5)
WBC: 4.5 10*3/uL (ref 4.0–10.5)
WBC: 4.2 10*3/uL (ref 4.0–10.5)
nRBC: 0 % (ref 0.0–0.2)
nRBC: 0 % (ref 0.0–0.2)

## 2023-05-18 LAB — MRSA NEXT GEN BY PCR, NASAL: MRSA by PCR Next Gen: DETECTED — AB

## 2023-05-18 LAB — HEPARIN LEVEL (UNFRACTIONATED): Heparin Unfractionated: 0.95 [IU]/mL — ABNORMAL HIGH (ref 0.30–0.70)

## 2023-05-18 LAB — HIV ANTIBODY (ROUTINE TESTING W REFLEX): HIV Screen 4th Generation wRfx: NONREACTIVE

## 2023-05-18 LAB — HEPATITIS B SURFACE ANTIBODY, QUANTITATIVE: Hep B S AB Quant (Post): <3.5 m[IU]/mL — ABNORMAL LOW

## 2023-05-18 LAB — APTT: aPTT: 90 s — ABNORMAL HIGH (ref 24–36)

## 2023-05-18 MED ORDER — ALTEPLASE 2 MG IJ SOLR: 2.0000 mg | Freq: Once | INTRAMUSCULAR | Status: DC | PRN

## 2023-05-18 MED ORDER — MIDODRINE HCL 5 MG PO TABS
10.0000 mg | ORAL_TABLET | ORAL | Status: DC
  Administered 2023-05-18: 10 mg via ORAL

## 2023-05-18 MED ORDER — RANOLAZINE ER 500 MG PO TB12: 500.0000 mg | ORAL_TABLET | Freq: Two times a day (BID) | ORAL | Status: DC

## 2023-05-18 MED ORDER — MIDODRINE HCL 5 MG PO TABS
10.0000 mg | ORAL_TABLET | Freq: Three times a day (TID) | ORAL | Status: DC
  Administered 2023-05-18 – 2023-05-19 (×3): 10 mg via ORAL
  Filled 2023-05-18 (×2): qty 2

## 2023-05-18 MED ORDER — APIXABAN 5 MG PO TABS
5.0000 mg | ORAL_TABLET | Freq: Two times a day (BID) | ORAL | Status: DC
  Administered 2023-05-18 – 2023-05-19 (×3): 5 mg via ORAL
  Filled 2023-05-18 (×3): qty 1

## 2023-05-18 MED ORDER — RANOLAZINE ER 500 MG PO TB12
500.0000 mg | ORAL_TABLET | Freq: Two times a day (BID) | ORAL | Status: DC
  Administered 2023-05-18 – 2023-05-19 (×2): 500 mg via ORAL
  Filled 2023-05-18 (×2): qty 1

## 2023-05-18 MED ORDER — MUPIROCIN 2 % EX OINT
1.0000 | TOPICAL_OINTMENT | Freq: Two times a day (BID) | CUTANEOUS | Status: DC
  Administered 2023-05-18 – 2023-05-19 (×3): 1 via NASAL
  Filled 2023-05-18 (×2): qty 22

## 2023-05-18 MED ORDER — MIDODRINE HCL 5 MG PO TABS
10.0000 mg | ORAL_TABLET | Freq: Once | ORAL | Status: AC
  Administered 2023-05-18: 10 mg via ORAL
  Filled 2023-05-18: qty 2

## 2023-05-18 MED ORDER — BUTALBITAL-APAP-CAFFEINE 50-325-40 MG PO TABS
1.0000 | ORAL_TABLET | Freq: Four times a day (QID) | ORAL | Status: DC | PRN
  Administered 2023-05-18: 1 via ORAL
  Filled 2023-05-18: qty 1

## 2023-05-18 NOTE — Progress Notes (Unsigned)
Cardiology Office Note   Date:  05/18/2023   ID:  [name], DOB 05-05-1961, MRN [id number]  PCP:  Miki Kins, FNP  Cardiologist:  Adrian Blackwater, MD      History of Present Illness: [name] is a 62 y.o. female who presents for No chief complaint on file.   HPI    Past Medical History:  Diagnosis Date   Adult behavior problems    frontal lobe CVA   Anemia    chronic disease   Atrial flutter by electrocardiogram (HCC) 03/22/2014   Cardiomyopathy (HCC)    CHF (congestive heart failure) (HCC)    Chicken pox    Chronic respiratory failure with hypoxia (HCC) 12/19/2021   Coronary artery disease    Delayed surgical wound healing 04/12/2014   Overview:  Left leg   GERD (gastroesophageal reflux disease)    H/O atrial flutter 04/12/2014   H/O stroke without residual deficits 02/13/2014   H/O: substance abuse (HCC) 02/27/2014   Heart failure (HCC)    Hepatitis    history of hep c   History of CVA (cerebrovascular accident) 02/21/2014   History of delirium 04/12/2014   Hyperkalemia 11/13/2014   Hyperlipidemia    Hypertension    Hypotension 02/24/2014   Leukocytosis 02/26/2014   Myocardial infarction [facility])    Obesity    Paroxysmal atrial fibrillation (HCC)    Peripheral vascular disease (HCC)    Postoperative anemia due to acute blood loss 02/27/2014   Renal failure    Renal insufficiency    Stroke [facility]) 2011   Superficial incisional surgical site infection 03/14/2014   Syncope and collapse 07/25/2020     Past Surgical History:  Procedure Laterality Date   A/V FISTULAGRAM Left 03/30/2019   Procedure: A/V FISTULAGRAM;  Surgeon: Annice Needy, MD;  Location: [facility] INV[facility] CV LAB;  Service: Cardiovascular;  Laterality: Left;   A/V FISTULAGRAM Left 10/06/2019   Procedure: A/V FISTULAGRAM;  Surgeon: Annice Needy, MD;  Location: [facility] CV LAB;  Service: Cardiovascular;  Laterality: Left;   A/V FISTULAGRAM Left 05/19/2021   Procedure: A/V  FISTULAGRAM;  Surgeon: Annice Needy, MD;  Location: [facility] LAB;  Service: Cardiovascular;  Laterality: Left;   A/V FISTULAGRAM Left 01/29/2022   Procedure: A/V Fistulagram;  Surgeon: Annice Needy, MD;  Location: [facility];  Service: Cardiovascular;  Laterality: Left;   A/V FISTULAGRAM Left 04/30/2022   Procedure: A/V Fistulagram;  Surgeon: Annice Needy, MD;  Location: [facility] CV [facility];  Service: Cardiovascular;  Laterality: Left;   A/V FISTULAGRAM Left 08/20/2022   Procedure: A/V Fistulagram;  Surgeon: Annice Needy, MD;  Location: [facility] LAB;  Service: Cardiovascular;  Laterality: Left;   A/V FISTULAGRAM N/A 12/03/2022   Procedure: A/V Fistulagram;  Surgeon: Annice Needy, MD;  Location: [facility] CV LAB;  Service: Cardiovascular;  Laterality: N/A;   A/V FISTULAGRAM Left 02/11/2023   Procedure: A/V Fistulagram;  Surgeon: Annice Needy, MD;  Location: [facility] CV LAB;  Service: Cardiovascular;  Laterality: Left;   COLONOSCOPY     COLONOSCOPY WITH PROPOFOL N/A 04/22/2021   Procedure: COLONOSCOPY WITH PROPOFOL;  Surgeon: Regis Bill, MD;  Location: [facility] ENDOSCOPY;  Service: Endoscopy;  Laterality: N/A;   CORONARY ANGIOPLASTY WITH STENT PLACEMENT  2013   CORONARY ARTERY BYPASS GRAFT     DIALYSIS FISTULA CREATION     DIALYSIS/PERMA CATHETER INSERTION N/A 04/08/2023   Procedure: DIALYSIS/PERMA CATHETER INSERTION;  Surgeon: Annice Needy, MD;  Location: [facility] INVAS[facility] CV LAB;  Service: Cardiovascular;  Laterality: N/A;   ESOPHAGOGASTRODUODENOSCOPY     ESOPHAGOGASTRODUODENOSCOPY N/A 04/22/2021   Procedure: ESOPHAGOGASTRODUODENOSCOPY (EGD);  Surgeon: Regis Bill, MD;  Location: [facility] ENDOSCOPY;  Service: Endoscopy;  Laterality: N/A;   FLEXIBLE SIGMOIDOSCOPY N/A 02/23/2019   Procedure: FLEXIBLE SIGMOIDOSCOPY;  Surgeon: Christena Deem, MD;  Location: [facility] ENDOSCOPY;  Service: Endoscopy;  Laterality: N/A;   LEFT HEART CATH AND CORS/GRAFTS ANGIOGRAPHY  N/A 08/08/2018   Procedure: LEFT HEART CATH AND CORS/GRAFTS ANGIOGRAPHY;  Surgeon: Laurier Nancy, MD;  Location: [facility] INVASIVE CV LAB;  Service: Cardiovascular;  Laterality: N/A;   LEFT HEART CATH AND CORS/GRAFTS ANGIOGRAPHY N/A 01/30/2019   Procedure: LEFT HEART CATH AND CORS/GRAFTS ANGIOGRAPHY;  Surgeon: Laurier Nancy, MD;  Location: [facility] INVASIVE CV LAB;  Service: Cardiovascular;  Laterality: N/A;   percutaneous insertion intra aortic balloon right cath     ultrasound guided pericardiocentesis       No current facility-administered medications for this visit.   No current outpatient medications on file.   Facility-Administered Medications Ordered in Other Visits  Medication Dose Route Frequency Provider Last Rate Last Admin   acetaminophen (TYLENOL) tablet 650 mg  650 mg Oral Q6H PRN Mikey College T, MD   650 mg at 05/18/23 0821   albumin human 25 % solution 25 g  25 g Intravenous Once in dialysis Wendee Beavers, NP       albuterol (PROVENTIL) (2.5 MG/3ML) 0.083% nebulizer solution 3 mL  3 mL Inhalation Q6H PRN Emeline General, MD       Chlorhexidine Gluconate Cloth 2 % PADS 6 each  6 each Topical Q0600 Wendee Beavers, NP   6 each at 05/18/23 0634   heparin ADULT infusion 100 units/mL (25000 units/2105mL)  1,050 Units/hr Intravenous Continuous Mikey College T, MD 10.5 mL/hr at 05/17/23 2224 1,050 Units/hr at 05/17/23 2224   heparin injection    PRN Annice Needy, MD   10,000 Units at 04/08/23 1412   hydrOXYzine (ATARAX) tablet 50 mg  50 mg Oral QHS Mikey College T, MD   50 mg at 05/17/23 2037   levothyroxine (SYNTHROID) tablet 75 mcg  75 mcg Oral Q0600 Mikey College T, MD   75 mcg at 05/18/23 0634   midodrine (PROAMATINE) tablet 10 mg  10 mg Oral Q M,W,F-HD Charise Killian, MD   10 mg at 05/18/23 0272   mupirocin ointment (BACTROBAN) 2 % 1 Application  1 Application Nasal BID Mikey College T, MD       nitroGLYCERIN (NITROSTAT) SL tablet 0.4 mg  0.4 mg Sublingual Q5 Min x 3 PRN Mikey College  T, MD       ondansetron [facility]) injection 4 mg  4 mg Intravenous Q6H PRN Mikey College T, MD   4 mg at 05/17/23 1958   oxyCODONE-acetaminophen (PERCOCET/ROXICET) 5-325 MG per tablet 1 tablet  1 tablet Oral Q6H PRN Emeline General, MD       pantoprazole (PROTONIX) EC tablet 40 mg  40 mg Oral Daily Mikey College T, MD   40 mg at 05/18/23 5366   patiromer Lelon Perla) packet 1 packet  1 packet Oral Daily Mikey College T, MD       ranolazine (RANEXA) 12 hr tablet 500 mg  500 mg Oral BID Mikey College T, MD   500 mg at 05/18/23 0820   rosuvastatin (CRESTOR) tablet 20 mg  20 mg Oral QHS Emeline General, MD  20 mg at 05/17/23 2325   sevelamer carbonate (RENVELA) tablet 3,200 mg  3,200 mg Oral TID WC Emeline General, MD   3,200 mg at 05/18/23 0820    Allergies:   Morphine and codeine, Levaquin [levofloxacin], Enalapril, Latex, and Tape    Social History:   reports that she has been smoking cigarettes. She has a 5 pack-year smoking history. She has never used smokeless tobacco. She reports that she does not drink alcohol and does not use drugs.   Family History:  family history includes Breast cancer in her maternal aunt and maternal grandmother; Cancer in an other family member; Diabetes type II in her maternal grandmother and mother; Hypertension in her father, mother, sister, and another family member; Kidney disease in her father; Ovarian cancer in her mother; Renal Disease in an other family member.    ROS:     ROS    All other systems are reviewed and negative.    PHYSICAL EXAM: VS:  There were no vitals taken for this visit. , BMI There is no height or weight on file to calculate BMI. Last weight:  Wt Readings from Last 3 Encounters:  05/18/23 162 lb (73.5 kg)  04/27/23 155 lb 6.4 oz (70.5 kg)  04/20/23 162 lb 6.4 oz (73.7 kg)     Physical Exam    EKG:   Recent Labs: 05/17/2023: ALT 16; BUN 64; Creatinine, Ser 8.54; Potassium 5.0; Sodium 137 05/18/2023: Hemoglobin 10.7; Platelets 116     Lipid Panel    Component Value Date/Time   CHOL 140 01/18/2019 1358   CHOL 79 06/14/2014 0404   TRIG 148 01/18/2019 1358   TRIG 85 06/14/2014 0404   HDL 43 01/18/2019 1358   HDL 35 (L) 06/14/2014 0404   CHOLHDL 3.3 01/18/2019 1358   VLDL 30 01/18/2019 1358   VLDL 17 06/14/2014 0404   LDLCALC 67 01/18/2019 1358   LDLCALC 27 06/14/2014 0404      Other studies Reviewed: Additional studies/ records that were reviewed today include:  Review of the above records demonstrates:       No data to display            ASSESSMENT AND PLAN:  No diagnosis found.   Problem List Items Addressed This Visit   None      Disposition:   No follow-ups on file.    Total time spent: {AMA time spent:29001} minutes  Signed,  Adrian Blackwater, MD  05/18/2023 9:58 AM    [facility]

## 2023-05-18 NOTE — Progress Notes (Signed)
PROGRESS NOTE    HPI was taken from Dr. Chipper Herb: [name] is a 62 y.o. female with medical history significant of CAD status post CABG x 3 with chronic stable angina, ESRD on HD MWF, HD associated hypotension on midodrine, chronic HFrEF with LVEF 20-25%, PAF and PVD on Eliquis, sent from HD center for evaluation of new onset chest pain.   Patient woke up this morning feeling lightheadedness and shortness of breath.  She went to her regular HD and was found to be hypotensive.  Soon after, patient started to feel pressure-like chest pain 7-8/10 associated with worsening of shortness of breath.  Patient reported that her BP/CHF medication was adjusted recently with losartan added to " straightening of the heart" about 1 month ago.  Otherwise she denied chronic chest pain or exertional dyspnea.  Chest pain subsided after given aspirin x 3 in the ED.  Patient estimated chest pain lasted 20 to 33 minutes in total.   ED Course: Bradycardia heart rate 40-50, blood pressure 80/50 on arrival responded to small IV bolus 500 mL blood pressure improved to 103/70.  Chest x-ray showed cardiomegaly and pulmonary congestion.  Blood work showed creatinine 8.5, BUN 64, troponin 75.  EKG showed sinus bradycardia and no acute ST changes.   The patient was started on heparin drip and cardiology consulted.  [name]  EAV:[id number] DOB: 13-Mar-1961 DOA: 05/17/2023 PCP: Miki Kins, FNP    Assessment & Plan:   Principal Problem:   NSTEMI (non-ST elevated myocardial infarction) [facility]) Active Problems:   ESRD on hemodialysis (HCC)   S/P CABG x 3   Unstable angina (HCC)  Assessment and Plan: Unstable angina: NSTEMI r/o. Troponins are minimally elevated and trending down. Pt does not want a cardiac cath & recently had cardiac stress test which showed a fixed defect as per cardio. Unable to tolerated GDMT as per cardio. Continue on ranexa, eliquis as per cardio. Cardio following and recs apprec    Chronic systolic CHF: appears euvolemic. Continue w/ ranexa. Fluid management w/ HD.    Hypotension: was on midodrine only HD days but changed to TID currently.   Sinus bradycardia: not currently on BB or CCB. Continue on tele.    PAF: holding amio. Continue on tele. Continue on eliquis.    ESRD on HD. Nephro following and recs apprec   Thrombocytopenia: etiology unclear. Will continue to monitor     DVT prophylaxis: eliquis  Code Status:full  Family Communication: discussed pt's care w/ pt's brother at bedside and answered his questions  Disposition Plan: depends on PT/OT recs   Level of care: Progressive  Status is: Inpatient Remains inpatient appropriate because: severity of illness    Consultants:  Cardio Nephro   Procedures:  Antimicrobials:    Subjective: Pt c/o chest pain that is tender to palpation   Objective: Vitals:   05/18/23 0005 05/18/23 0254 05/18/23 0455 05/18/23 0801  BP: 101/66 (!) 93/53 (!) 87/59 (!) 85/47  Pulse: (!) 54 (!) 49 (!) 50 (!) 33  Resp: 13 18 18 20   Temp:  98.3 F (36.8 C) 97.8 F (36.6 C) 98 F (36.7 C)  TempSrc:  Oral Oral Oral  SpO2: 100% 100% 95% 93%  Weight:  73.5 kg    Height:        Intake/Output Summary (Last 24 hours) at 05/18/2023 1136 Last data filed at 05/18/2023 0800 Gross per 24 hour  Intake 595.31 ml  Output 2500 ml  Net -1904.69 ml  Filed Weights   05/17/23 0741 05/18/23 0254  Weight: 73.5 kg 73.5 kg    Examination:  General exam: Appears calm and comfortable  Respiratory system: decreased breath sounds b/l Cardiovascular system: S1 & S2+. No rubs, gallops or clicks.  Gastrointestinal system: Abdomen is nondistended, soft and nontender. Normal bowel sounds heard. Central nervous system: Alert and awake. Moves all extremities  Psychiatry: Judgement and insight appear normal. Flat mood and affect     Data Reviewed: I have personally reviewed following labs and imaging studies  CBC: Recent  Labs  Lab 05/17/23 0747 05/18/23 0601  WBC 4.9 4.5  NEUTROABS 3.1  --   HGB 11.7* 10.7*  HCT 37.1 32.8*  MCV 99.7 96.8  PLT 113* 116*   Basic Metabolic Panel: Recent Labs  Lab 05/17/23 0747  NA 137  K 5.0  CL 100  CO2 22  GLUCOSE 102*  BUN 64*  CREATININE 8.54*  CALCIUM 9.6   GFR: Estimated Creatinine Clearance: 7.2 mL/min (A) (by C-G formula based on SCr of 8.54 mg/dL (H)). Liver Function Tests: Recent Labs  Lab 05/17/23 0747  AST 24  ALT 16  ALKPHOS 105  BILITOT 0.8  PROT 7.7  ALBUMIN 3.6   No results for input(s): "LIPASE", "AMYLASE" in the last 168 hours. No results for input(s): "AMMONIA" in the last 168 hours. Coagulation Profile: Recent Labs  Lab 05/17/23 0747  INR 1.6*   Cardiac Enzymes: No results for input(s): "CKTOTAL", "CKMB", "CKMBINDEX", "TROPONINI" in the last 168 hours. BNP (last 3 results) No results for input(s): "PROBNP" in the last 8760 hours. HbA1C: No results for input(s): "HGBA1C" in the last 72 hours. CBG: No results for input(s): "GLUCAP" in the last 168 hours. Lipid Profile: No results for input(s): "CHOL", "HDL", "LDLCALC", "TRIG", "CHOLHDL", "LDLDIRECT" in the last 72 hours. Thyroid Function Tests: No results for input(s): "TSH", "T4TOTAL", "FREET4", "T3FREE", "THYROIDAB" in the last 72 hours. Anemia Panel: No results for input(s): "VITAMINB12", "FOLATE", "FERRITIN", "TIBC", "IRON", "RETICCTPCT" in the last 72 hours. Sepsis Labs: Recent Labs  Lab 05/17/23 0854  LATICACIDVEN 0.9    Recent Results (from the past 240 hour(s))  MRSA Next Gen by PCR, Nasal     Status: Abnormal   Collection Time: 05/18/23  2:57 AM   Specimen: Nasal Mucosa; Nasal Swab  Result Value Ref Range Status   MRSA by PCR Next Gen DETECTED (A) NOT DETECTED Final    Comment: RESULT CALLED TO, READ BACK BY AND VERIFIED WITH:  MARCEL TURNER AT 0506 05/18/23 JG (NOTE) The GeneXpert MRSA Assay (FDA approved for NASAL specimens only), is one component  of a comprehensive MRSA colonization surveillance program. It is not intended to diagnose MRSA infection nor to guide or monitor treatment for MRSA infections. Test performance is not FDA approved in patients less than 101 years old. Performed at [facility], [address], [place], [place] [address]          Radiology Studies: ECHOCARDIOGRAM COMPLETE  Result Date: 05/17/2023    ECHOCARDIOGRAM REPORT   Patient Name:   MAYUMI RIDDLES [facility] Date of Exam: 05/17/2023 Medical Rec #:  [id number]         Height:       66.0 in Accession #:    [id number]        Weight:       162.0 lb Date of Birth:  [birth year]          BSA:  1.828 m Patient Age:    62 years          BP:           103/76 mmHg Patient Gender: F                 HR:           51 bpm. Exam Location:  [facility] Procedure: 2D Echo, 3D Echo, Cardiac Doppler, Color Doppler and Strain Analysis Indications:     NSTEMI  History:         Patient has prior history of Echocardiogram examinations, most                  recent 12/22/2021. CHF, Acute MI and Angina, Prior CABG,                  Arrythmias:Atrial Fibrillation; Risk Factors:Hypertension,                  Dyslipidemia and Former Smoker. ESRD on Dialysis.  Sonographer:     Mikki Harbor Referring Phys:  [id number] Emeline General Diagnosing Phys: Adrian Blackwater  Sonographer Comments: Global longitudinal strain was attempted. IMPRESSIONS  1. Left ventricular ejection fraction, by estimation, is 25 to 30%. The left ventricle has severely decreased function. The left ventricle demonstrates global hypokinesis. The left ventricular internal cavity size was severely dilated. There is moderate  left ventricular hypertrophy. Left ventricular diastolic parameters are consistent with Grade II diastolic dysfunction (pseudonormalization).  2. Right ventricular systolic function is severely reduced. The right ventricular size is severely enlarged. Mildly increased right ventricular wall thickness. There is  severely elevated pulmonary artery systolic pressure.  3. Left atrial size was severely dilated.  4. Right atrial size was mildly dilated.  5. The mitral valve is grossly normal. Mild to moderate mitral valve regurgitation.  6. Tricuspid valve regurgitation is moderate.  7. The aortic valve is calcified. Aortic valve regurgitation is not visualized. FINDINGS  Left Ventricle: Left ventricular ejection fraction, by estimation, is 25 to 30%. The left ventricle has severely decreased function. The left ventricle demonstrates global hypokinesis. The left ventricular internal cavity size was severely dilated. There is moderate left ventricular hypertrophy. Left ventricular diastolic parameters are consistent with Grade II diastolic dysfunction (pseudonormalization). Right Ventricle: The right ventricular size is severely enlarged. Mildly increased right ventricular wall thickness. Right ventricular systolic function is severely reduced. There is severely elevated pulmonary artery systolic pressure. The tricuspid regurgitant velocity is 3.80 m/s, and with an assumed right atrial pressure of 15 mmHg, the estimated right ventricular systolic pressure is 72.8 mmHg. Left Atrium: Left atrial size was severely dilated. Right Atrium: Right atrial size was mildly dilated. Pericardium: There is no evidence of pericardial effusion. Mitral Valve: The mitral valve is grossly normal. Mild to moderate mitral valve regurgitation. MV peak gradient, 4.8 mmHg. The mean mitral valve gradient is 1.0 mmHg. Tricuspid Valve: The tricuspid valve is grossly normal. Tricuspid valve regurgitation is moderate. Aortic Valve: The aortic valve is calcified. Aortic valve regurgitation is not visualized. Aortic valve mean gradient measures 4.5 mmHg. Aortic valve peak gradient measures 9.7 mmHg. Aortic valve area, by VTI measures 2.21 cm. Pulmonic Valve: The pulmonic valve was grossly normal. Pulmonic valve regurgitation is not visualized. Aorta: The  aortic root, ascending aorta and aortic arch are all structurally normal, with no evidence of dilitation or obstruction. IAS/Shunts: No atrial level shunt detected by color flow Doppler.  LEFT VENTRICLE PLAX 2D LVIDd:  6.70 cm      Diastology LVIDs:         5.80 cm      LV e' lateral:   8.68 cm/s LV PW:         0.90 cm      LV E/e' lateral: 11.4 LV IVS:        1.40 cm LVOT diam:     2.10 cm LV SV:         67 LV SV Index:   36 LVOT Area:     3.46 cm  LV Volumes (MOD) LV vol d, MOD A2C: 204.0 ml LV vol d, MOD A4C: 167.0 ml LV vol s, MOD A2C: 141.0 ml LV vol s, MOD A4C: 125.0 ml LV SV MOD A2C:     63.0 ml LV SV MOD A4C:     167.0 ml LV SV MOD BP:      46.6 ml RIGHT VENTRICLE RV Basal diam:  4.15 cm RV Mid diam:    3.10 cm RV S prime:     7.05 cm/s TAPSE (M-mode): 1.5 cm LEFT ATRIUM              Index        RIGHT ATRIUM           Index LA diam:        5.60 cm  3.06 cm/m   RA Area:     33.30 cm LA Vol (A2C):   84.7 ml  46.32 ml/m  RA Volume:   132.00 ml 72.19 ml/m LA Vol (A4C):   107.0 ml 58.52 ml/m LA Biplane Vol: 96.9 ml  52.99 ml/m  AORTIC VALVE                    PULMONIC VALVE AV Area (Vmax):    2.10 cm     PV Vmax:       0.80 m/s AV Area (Vmean):   1.91 cm     PV Peak grad:  2.6 mmHg AV Area (VTI):     2.21 cm AV Vmax:           155.50 cm/s AV Vmean:          99.300 cm/s AV VTI:            0.301 m AV Peak Grad:      9.7 mmHg AV Mean Grad:      4.5 mmHg LVOT Vmax:         94.30 cm/s LVOT Vmean:        54.800 cm/s LVOT VTI:          0.192 m LVOT/AV VTI ratio: 0.64  AORTA Ao Root diam: 3.20 cm MITRAL VALVE               TRICUSPID VALVE MV Area (PHT): 6.17 cm    TR Peak grad:   57.8 mmHg MV Area VTI:   1.75 cm    TR Vmax:        380.00 cm/s MV Peak grad:  4.8 mmHg MV Mean grad:  1.0 mmHg    SHUNTS MV Vmax:       1.10 m/s    Systemic VTI:  0.19 m MV Vmean:      45.2 cm/s   Systemic Diam: 2.10 cm MV Decel Time: 123 msec MV E velocity: 99.10 cm/s MV A velocity: 35.50 cm/s MV E/A ratio:  2.79 [name]  [employer] signed by Adrian Blackwater Signature Date/Time: 05/17/2023/2:04:57 PM  Final    DG Chest Portable 1 View  Result Date: 05/17/2023 CLINICAL DATA:  Shortness of breath and chest pain.  On dialysis. EXAM: PORTABLE CHEST 1 VIEW COMPARISON:  Chest radiographs 01/30/2022 and CT 02/16/2022 FINDINGS: A right jugular dialysis catheter terminates near the superior cavoatrial junction. Sequelae of CABG are again identified. The cardiac silhouette remains moderately to severely enlarged. There is mild central pulmonary vascular congestion without overt edema. Minimal left basilar opacity likely reflects atelectasis. No sizable pleural effusion or pneumothorax is identified. No acute osseous abnormality is seen. IMPRESSION: Cardiomegaly and mild pulmonary vascular congestion. Electronically Signed   By: Sebastian Ache M.D.   On: 05/17/2023 08:26        Scheduled Meds:  apixaban  5 mg Oral BID   Chlorhexidine Gluconate Cloth  6 each Topical Q0600   hydrOXYzine  50 mg Oral QHS   levothyroxine  75 mcg Oral Q0600   midodrine  10 mg Oral Q M,W,F-HD   mupirocin ointment  1 Application Nasal BID   pantoprazole  40 mg Oral Daily   patiromer  1 packet Oral Daily   ranolazine  500 mg Oral BID   rosuvastatin  20 mg Oral QHS   sevelamer carbonate  3,200 mg Oral TID WC   Continuous Infusions:  albumin human       LOS: 1 day      Charise Killian, MD [facility] Pager [phone]  If 7PM-7AM, please contact night-coverage [email or web address] 05/18/2023, 11:36 AM

## 2023-05-18 NOTE — Plan of Care (Signed)

## 2023-05-18 NOTE — Plan of Care (Signed)
  Problem: Cardiac: Goal: Ability to achieve and maintain adequate cardiovascular perfusion will improve Outcome: Not Progressing   Problem: Clinical Measurements: Goal: Will remain free from infection Outcome: Progressing Goal: Diagnostic test results will improve Outcome: Progressing

## 2023-05-18 NOTE — Progress Notes (Signed)
Consult to HF Navigation Team Placed. Unfortunately, this patient does not meet criteria given ESRD on HD and GDMT is limited. Please feel free to reach out with any questions or medication assistance needs.  Thank you for involving the HF Navigation Team in this patient's care.  Enos Fling, PharmD, BCPS Clinical Pharmacist 01/21/2023 12:50 PM

## 2023-05-18 NOTE — Progress Notes (Signed)
   05/18/23 0254  Assess: MEWS Score  Temp 98.3 F (36.8 C)  BP (!) 93/53  MAP (mmHg) 66  Pulse Rate (!) 49  Resp 18  SpO2 100 %  O2 Device Nasal Cannula  Assess: MEWS Score  MEWS Temp 0  MEWS Systolic 1  MEWS Pulse 1  MEWS RR 0  MEWS LOC 0  MEWS Score 2  MEWS Score Color Yellow  Assess: if the MEWS score is Yellow or Red  Were vital signs accurate and taken at a resting state? Yes  Does the patient meet 2 or more of the SIRS criteria? No  MEWS guidelines implemented  Yes, yellow  Treat  MEWS Interventions Considered administering scheduled or prn medications/treatments as ordered  Take Vital Signs  Increase Vital Sign Frequency  Yellow: Q2hr x1, continue Q4hrs until patient remains green for 12hrs  Escalate  MEWS: Escalate Yellow: Discuss with charge nurse and consider notifying provider and/or RRT  Notify: Charge Nurse/RN  Name of Charge Nurse/RN Notified Charnay Nazario turner  Assess: SIRS CRITERIA  SIRS Temperature  0  SIRS Pulse 0  SIRS Respirations  0  SIRS WBC 0  SIRS Score Sum  0   Patient was admitted for hypotension

## 2023-05-18 NOTE — Progress Notes (Signed)
Heart Failure Navigator Progress Note  Assessed for Heart & Vascular TOC clinic readiness.  Patient does not meet criteria due to ESRD on Hemodialysis.   Navigator will sign off at this time.  Roxy Horseman, RN, BSN [facility] Heart Failure Navigator Secure Chat Only

## 2023-05-18 NOTE — Progress Notes (Signed)
SUBJECTIVE: Patient still has intermittent chest pain but is much better compared to when she first came in.  Right now not having any chest pain but headache.   Vitals:   05/18/23 0005 05/18/23 0254 05/18/23 0455 05/18/23 0801  BP: 101/66 (!) 93/53 (!) 87/59 (!) 85/47  Pulse: (!) 54 (!) 49 (!) 50 (!) 33  Resp: 13 18 18 20   Temp:  98.3 F (36.8 C) 97.8 F (36.6 C) 98 F (36.7 C)  TempSrc:  Oral Oral Oral  SpO2: 100% 100% 95% 93%  Weight:  73.5 kg    Height:        Intake/Output Summary (Last 24 hours) at 05/18/2023 1000 Last data filed at 05/18/2023 0800 Gross per 24 hour  Intake 595.31 ml  Output 2500 ml  Net -1904.69 ml    LABS: Basic Metabolic Panel: Recent Labs    05/17/23 0747  NA 137  K 5.0  CL 100  CO2 22  GLUCOSE 102*  BUN 64*  CREATININE 8.54*  CALCIUM 9.6   Liver Function Tests: Recent Labs    05/17/23 0747  AST 24  ALT 16  ALKPHOS 105  BILITOT 0.8  PROT 7.7  ALBUMIN 3.6   No results for input(s): "LIPASE", "AMYLASE" in the last 72 hours. CBC: Recent Labs    05/17/23 0747 05/18/23 0601  WBC 4.9 4.5  NEUTROABS 3.1  --   HGB 11.7* 10.7*  HCT 37.1 32.8*  MCV 99.7 96.8  PLT 113* 116*   Cardiac Enzymes: No results for input(s): "CKTOTAL", "CKMB", "CKMBINDEX", "TROPONINI" in the last 72 hours. BNP: Invalid input(s): "POCBNP" D-Dimer: No results for input(s): "DDIMER" in the last 72 hours. Hemoglobin A1C: No results for input(s): "HGBA1C" in the last 72 hours. Fasting Lipid Panel: No results for input(s): "CHOL", "HDL", "LDLCALC", "TRIG", "CHOLHDL", "LDLDIRECT" in the last 72 hours. Thyroid Function Tests: No results for input(s): "TSH", "T4TOTAL", "T3FREE", "THYROIDAB" in the last 72 hours.  Invalid input(s): "FREET3" Anemia Panel: No results for input(s): "VITAMINB12", "FOLATE", "FERRITIN", "TIBC", "IRON", "RETICCTPCT" in the last 72 hours.   PHYSICAL EXAM General: Well developed, well nourished, in no acute distress HEENT:   Normocephalic and atramatic Neck:  No JVD.  Lungs: Clear bilaterally to auscultation and percussion. Heart: HRRR . Normal S1 and S2 without gallops or murmurs.  Abdomen: Bowel sounds are positive, abdomen soft and non-tender  Msk:  Back normal, normal gait. Normal strength and tone for age. Extremities: No clubbing, cyanosis or edema.   Neuro: Alert and oriented X 3. Psych:  Good affect, responds appropriately  TELEMETRY: Sinus bradycardia about 57 bpm  ASSESSMENT AND PLAN: Chest pain appears to be atypical with no significant bump in cardiac enzymes does not doubt it is non-STEMI.  Most likely demand ischemia.  Echocardiogram showed severe LV dysfunction with four-chamber dilatation LVEF 30%.  Patient had echo in the office which also showed severe LV dysfunction losartan was started but unfortunately developed hypotension.  Patient ideally should be on Entresto and carvedilol and Aldactone for guideline directed medical therapy for ischemic cardiomyopathy, but unfortunately due to hypotension and bradycardia unable to do that.  May have to start once blood pressure is stable low-dose of enalapril 2.5 but not right now.  Also patient was on amiodarone 200 mg p.o. once a day which is on hold because of bradycardia and may have to change that to 100 mg once a day as an outpatient but not right now.  May restart Eliquis as patient does not  want to have cardiac catheterization at this time.  Will add Ranexa 500 p.o. twice daily for chest pain as patient will not be able to tolerate isosorbide.  EKG does not have any changes suggestive of severe ischemia.  She already had few few months back nuclear stress test which revealed mostly fixed defect.  It is going to be very difficult to treat her cardiomyopathy when she is unable to tolerate GDMT.  At this time we will just start the patient on Ranexa restart Eliquis and she can be cardiac point of view discharged with follow-up in the office.  She can see me on  Friday at 9 AM.   ICD-10-CM   1. Chest pain, unspecified type  R07.9     2. Elevated troponin  R79.89     3. Bradycardia  R00.1       Principal Problem:   NSTEMI (non-ST elevated myocardial infarction) [facility]) Active Problems:   ESRD on hemodialysis (HCC)   S/P CABG x 3   Unstable angina (HCC)    Adrian Blackwater, MD, [facility] 05/18/2023 10:00 AM

## 2023-05-18 NOTE — Progress Notes (Signed)
[facility]  ROUNDING NOTE   Subjective:   [name] is a 62 year old African-American female with past medical histories including CAD with CABG and end-stage renal disease on dialysis. Patient presents to the emergency department with complaints of chest pain and low BP. She has been admitted for Bradycardia [R00.1] Elevated troponin [R79.89] NSTEMI (non-ST elevated myocardial infarction) [facility]) [I21.4] Chest pain, unspecified type [R07.9]  Patient is known to our clinic and receives outpatient dialysis treatments at [facility] on MWF schedule, supervised by Dr. Thedore Mins.  Patient seen laying in bed Alert States she feels dizzy, low BP today Breathing status has improved Denies chest pain   Objective:  Vital signs in last 24 hours:  Temp:  [97.4 F (36.3 C)-98.3 F (36.8 C)] 98 F (36.7 C) (12/03 1146) Pulse Rate:  [33-79] 53 (12/03 1148) Resp:  [12-20] 20 (12/03 0801) BP: (85-120)/(47-80) 103/57 (12/03 1148) SpO2:  [93 %-100 %] 100 % (12/03 1148) Weight:  [73.5 kg] 73.5 kg (12/03 0254)  Weight change:  Filed Weights   05/17/23 0741 05/18/23 0254  Weight: 73.5 kg 73.5 kg    Intake/Output: I/O last 3 completed shifts: In: 302.8 [P.O.:200; I.V.:102.8] Out: 2500 [Other:2500]   Intake/Output this shift:  Total I/O In: 410.5 [P.O.:358; I.V.:52.5] Out: 0   Physical Exam: General: NAD  Head: Normocephalic, atraumatic. Moist oral mucosal membranes  Eyes: Anicteric  Lungs:  Clear to auscultation, normal effort  Heart: Regular rate and rhythm  Abdomen:  Soft, nontender  Extremities: No peripheral edema.  Neurologic: Alert and oriented, moving all four extremities  Skin: No lesions  Access: Left chest PermCath    Basic Metabolic Panel: Recent Labs  Lab 05/17/23 0747  NA 137  K 5.0  CL 100  CO2 22  GLUCOSE 102*  BUN 64*  CREATININE 8.54*  CALCIUM 9.6    Liver Function Tests: Recent Labs  Lab 05/17/23 0747  AST 24  ALT  16  ALKPHOS 105  BILITOT 0.8  PROT 7.7  ALBUMIN 3.6   No results for input(s): "LIPASE", "AMYLASE" in the last 168 hours. No results for input(s): "AMMONIA" in the last 168 hours.  CBC: Recent Labs  Lab 05/17/23 0747 05/18/23 0601  WBC 4.9 4.5  NEUTROABS 3.1  --   HGB 11.7* 10.7*  HCT 37.1 32.8*  MCV 99.7 96.8  PLT 113* 116*    Cardiac Enzymes: No results for input(s): "CKTOTAL", "CKMB", "CKMBINDEX", "TROPONINI" in the last 168 hours.  BNP: Invalid input(s): "POCBNP"  CBG: No results for input(s): "GLUCAP" in the last 168 hours.  Microbiology: Results for orders placed or performed during the hospital encounter of 05/17/23  MRSA Next Gen by PCR, Nasal     Status: Abnormal   Collection Time: 05/18/23  2:57 AM   Specimen: Nasal Mucosa; Nasal Swab  Result Value Ref Range Status   MRSA by PCR Next Gen DETECTED (A) NOT DETECTED Final    Comment: RESULT CALLED TO, READ BACK BY AND VERIFIED WITH:  MARCEL TURNER AT 0506 05/18/23 JG (NOTE) The GeneXpert MRSA Assay (FDA approved for NASAL specimens only), is one component of a comprehensive MRSA colonization surveillance program. It is not intended to diagnose MRSA infection nor to guide or monitor treatment for MRSA infections. Test performance is not FDA approved in patients less than 54 years old. Performed at [facility], [address] [place], [place] [address]     Coagulation Studies: Recent Labs    05/17/23 0747  LABPROT  18.9*  INR 1.6*    Urinalysis: No results for input(s): "COLORURINE", "LABSPEC", "PHURINE", "GLUCOSEU", "HGBUR", "BILIRUBINUR", "KETONESUR", "PROTEINUR", "UROBILINOGEN", "NITRITE", "LEUKOCYTESUR" in the last 72 hours.  Invalid input(s): "APPERANCEUR"    Imaging: ECHOCARDIOGRAM COMPLETE  Result Date: 05/17/2023    ECHOCARDIOGRAM REPORT   Patient Name:   [name] [facility] Date of Exam: 05/17/2023 Medical Rec #:  [id number]         Height:       66.0 in Accession #:     [id number]        Weight:       162.0 lb Date of Birth:  18-Jul-1960          BSA:          1.828 m Patient Age:    62 years          BP:           103/76 mmHg Patient Gender: F                 HR:           51 bpm. Exam Location:  [facility] Procedure: 2D Echo, 3D Echo, Cardiac Doppler, Color Doppler and Strain Analysis Indications:     NSTEMI  History:         Patient has prior history of Echocardiogram examinations, most                  recent 12/22/2021. CHF, Acute MI and Angina, Prior CABG,                  Arrythmias:Atrial Fibrillation; Risk Factors:Hypertension,                  Dyslipidemia and Former Smoker. ESRD on Dialysis.  Sonographer:     Mikki Harbor Referring Phys:  [id number] Emeline General Diagnosing Phys: Adrian Blackwater  Sonographer Comments: Global longitudinal strain was attempted. IMPRESSIONS  1. Left ventricular ejection fraction, by estimation, is 25 to 30%. The left ventricle has severely decreased function. The left ventricle demonstrates global hypokinesis. The left ventricular internal cavity size was severely dilated. There is moderate  left ventricular hypertrophy. Left ventricular diastolic parameters are consistent with Grade II diastolic dysfunction (pseudonormalization).  2. Right ventricular systolic function is severely reduced. The right ventricular size is severely enlarged. Mildly increased right ventricular wall thickness. There is severely elevated pulmonary artery systolic pressure.  3. Left atrial size was severely dilated.  4. Right atrial size was mildly dilated.  5. The mitral valve is grossly normal. Mild to moderate mitral valve regurgitation.  6. Tricuspid valve regurgitation is moderate.  7. The aortic valve is calcified. Aortic valve regurgitation is not visualized. FINDINGS  Left Ventricle: Left ventricular ejection fraction, by estimation, is 25 to 30%. The left ventricle has severely decreased function. The left ventricle demonstrates global hypokinesis. The left  ventricular internal cavity size was severely dilated. There is moderate left ventricular hypertrophy. Left ventricular diastolic parameters are consistent with Grade II diastolic dysfunction (pseudonormalization). Right Ventricle: The right ventricular size is severely enlarged. Mildly increased right ventricular wall thickness. Right ventricular systolic function is severely reduced. There is severely elevated pulmonary artery systolic pressure. The tricuspid regurgitant velocity is 3.80 m/s, and with an assumed right atrial pressure of 15 mmHg, the estimated right ventricular systolic pressure is 72.8 mmHg. Left Atrium: Left atrial size was severely dilated. Right Atrium: Right atrial size was mildly dilated. Pericardium: There is no evidence of pericardial effusion. Mitral  Valve: The mitral valve is grossly normal. Mild to moderate mitral valve regurgitation. MV peak gradient, 4.8 mmHg. The mean mitral valve gradient is 1.0 mmHg. Tricuspid Valve: The tricuspid valve is grossly normal. Tricuspid valve regurgitation is moderate. Aortic Valve: The aortic valve is calcified. Aortic valve regurgitation is not visualized. Aortic valve mean gradient measures 4.5 mmHg. Aortic valve peak gradient measures 9.7 mmHg. Aortic valve area, by VTI measures 2.21 cm. Pulmonic Valve: The pulmonic valve was grossly normal. Pulmonic valve regurgitation is not visualized. Aorta: The aortic root, ascending aorta and aortic arch are all structurally normal, with no evidence of dilitation or obstruction. IAS/Shunts: No atrial level shunt detected by color flow Doppler.  LEFT VENTRICLE PLAX 2D LVIDd:         6.70 cm      Diastology LVIDs:         5.80 cm      LV e' lateral:   8.68 cm/s LV PW:         0.90 cm      LV E/e' lateral: 11.4 LV IVS:        1.40 cm LVOT diam:     2.10 cm LV SV:         67 LV SV Index:   36 LVOT Area:     3.46 cm  LV Volumes (MOD) LV vol d, MOD A2C: 204.0 ml LV vol d, MOD A4C: 167.0 ml LV vol s, MOD A2C: 141.0  ml LV vol s, MOD A4C: 125.0 ml LV SV MOD A2C:     63.0 ml LV SV MOD A4C:     167.0 ml LV SV MOD BP:      46.6 ml RIGHT VENTRICLE RV Basal diam:  4.15 cm RV Mid diam:    3.10 cm RV S prime:     7.05 cm/s TAPSE (M-mode): 1.5 cm LEFT ATRIUM              Index        RIGHT ATRIUM           Index LA diam:        5.60 cm  3.06 cm/m   RA Area:     33.30 cm LA Vol (A2C):   84.7 ml  46.32 ml/m  RA Volume:   132.00 ml 72.19 ml/m LA Vol (A4C):   107.0 ml 58.52 ml/m LA Biplane Vol: 96.9 ml  52.99 ml/m  AORTIC VALVE                    PULMONIC VALVE AV Area (Vmax):    2.10 cm     PV Vmax:       0.80 m/s AV Area (Vmean):   1.91 cm     PV Peak grad:  2.6 mmHg AV Area (VTI):     2.21 cm AV Vmax:           155.50 cm/s AV Vmean:          99.300 cm/s AV VTI:            0.301 m AV Peak Grad:      9.7 mmHg AV Mean Grad:      4.5 mmHg LVOT Vmax:         94.30 cm/s LVOT Vmean:        54.800 cm/s LVOT VTI:          0.192 m LVOT/AV VTI ratio: 0.64  AORTA Ao Root diam: 3.20 cm MITRAL VALVE  TRICUSPID VALVE MV Area (PHT): 6.17 cm    TR Peak grad:   57.8 mmHg MV Area VTI:   1.75 cm    TR Vmax:        380.00 cm/s MV Peak grad:  4.8 mmHg MV Mean grad:  1.0 mmHg    SHUNTS MV Vmax:       1.10 m/s    Systemic VTI:  0.19 m MV Vmean:      45.2 cm/s   Systemic Diam: 2.10 cm MV Decel Time: 123 msec MV E velocity: 99.10 cm/s MV A velocity: 35.50 cm/s MV E/A ratio:  2.79 Shaukat Khan Electronically signed by Adrian Blackwater Signature Date/Time: 05/17/2023/2:04:57 PM    Final    DG Chest Portable 1 View  Result Date: 05/17/2023 CLINICAL DATA:  Shortness of breath and chest pain.  On dialysis. EXAM: PORTABLE CHEST 1 VIEW COMPARISON:  Chest radiographs 01/30/2022 and CT 02/16/2022 FINDINGS: A right jugular dialysis catheter terminates near the superior cavoatrial junction. Sequelae of CABG are again identified. The cardiac silhouette remains moderately to severely enlarged. There is mild central pulmonary vascular congestion without  overt edema. Minimal left basilar opacity likely reflects atelectasis. No sizable pleural effusion or pneumothorax is identified. No acute osseous abnormality is seen. IMPRESSION: Cardiomegaly and mild pulmonary vascular congestion. Electronically Signed   By: Sebastian Ache M.D.   On: 05/17/2023 08:26     Medications:    albumin human      apixaban  5 mg Oral BID   Chlorhexidine Gluconate Cloth  6 each Topical Q0600   hydrOXYzine  50 mg Oral QHS   levothyroxine  75 mcg Oral Q0600   midodrine  10 mg Oral TID   midodrine  10 mg Oral Once in dialysis   mupirocin ointment  1 Application Nasal BID   pantoprazole  40 mg Oral Daily   patiromer  1 packet Oral Daily   ranolazine  500 mg Oral BID   rosuvastatin  20 mg Oral QHS   sevelamer carbonate  3,200 mg Oral TID WC   albuterol, alteplase, butalbital-acetaminophen-caffeine, nitroGLYCERIN, ondansetron (ZOFRAN) IV, oxyCODONE-acetaminophen  Assessment/ Plan:  [name] is a 62 y.o.  female with past medical histories including CAD with CABG and end-stage renal disease on dialysis. Patient presents to the emergency department with complaints of chest pain and low BP. She has been admitted for Bradycardia [R00.1] Elevated troponin [R79.89] NSTEMI (non-ST elevated myocardial infarction) (HCC) [I21.4] Chest pain, unspecified type [R07.9]  [facility]/MWF/RT permcath  End-stage renal disease on hemodialysis.  No missed recent treatments. Will provide dialysis treatment today, as patient is 4.9kg over dry weight.  Dialysis received yesterday with 2.5L fluid removal. Patient remains above target weight and agreeable to UF only treatment today. Next treatment scheduled for Wednesday.  2. Anemia of chronic kidney disease Lab Results  Component Value Date   HGB 10.7 (L) 05/18/2023    Hgb stable. Mircera received at outpatient clinic.   3. Secondary Hyperparathyroidism: with outpatient labs: PTH 417, phosphorus 5.5, calcium  9.4 on 04/26/23.   Lab Results  Component Value Date   PTH 627 (H) 07/26/2020   CALCIUM 9.6 05/17/2023   CAION 0.58 (LL) 04/22/2021   PHOS 3.4 02/16/2022    Prescribed calcitriol and sevelamer outpatient. Will continue to monitor bone minerals during this admission.   4. Atypical chest pain, Troponin 75-68. No ST changes on EKG. Cardiology consulted and feels hypotension and bradycardia produced chest pain from hypoperfusion.  Denies chest pain today   LOS: 1 Blannie Shedlock 12/3/202412:48 PM

## 2023-05-18 NOTE — Progress Notes (Signed)
Hemodialysis note  Received patient in bed to unit. Alert and oriented.  Informed consent signed and in chart.  Treatment initiated: 1414 Treatment completed: 1655  Patient tolerated well. Transported back to room, alert without acute distress.  Report given to patient's RN.   Access used: Right Chest HD Catheter Access issues: None  Total UF removed: 2.5L Medication(s) given:  none  Post HD weight: 67.3 kg   [name] [facility]

## 2023-05-18 NOTE — Consult Note (Signed)
PHARMACY - ANTICOAGULATION CONSULT NOTE  Pharmacy Consult for Heparin  Indication: chest pain/ACS  Allergies  Allergen Reactions   Morphine And Codeine Hives   Levaquin [Levofloxacin] Itching    Severe itching; prickly sensation   Enalapril Other (See Comments)    hallucinations   Latex Rash   Tape Rash and Other (See Comments)   Patient Measurements: Height: 5\' 6"  (167.6 cm) Weight: 73.5 kg (162 lb) IBW/kg (Calculated) : 59.3 Heparin Dosing Weight: 73.5 kg   Vital Signs: Temp: 97.8 F (36.6 C) (12/03 0455) Temp Source: Oral (12/03 0455) BP: 87/59 (12/03 0455) Pulse Rate: 50 (12/03 0455)  Labs: Recent Labs    05/17/23 0747 05/17/23 1048 05/17/23 2133 05/18/23 0601  HGB 11.7*  --   --  10.7*  HCT 37.1  --   --  32.8*  PLT 113*  --   --  116*  APTT 30  --  62* 90*  LABPROT 18.9*  --   --   --   INR 1.6*  --   --   --   HEPARINUNFRC >1.10*  --   --  0.95*  CREATININE 8.54*  --   --   --   TROPONINIHS 75* 68*  --   --    Estimated Creatinine Clearance: 7.2 mL/min (A) (by C-G formula based on SCr of 8.54 mg/dL (H)).  Medical History: Past Medical History:  Diagnosis Date   Adult behavior problems    frontal lobe CVA   Anemia    chronic disease   Atrial flutter by electrocardiogram (HCC) 03/22/2014   Cardiomyopathy (HCC)    CHF (congestive heart failure) (HCC)    Chicken pox    Chronic respiratory failure with hypoxia (HCC) 12/19/2021   Coronary artery disease    Delayed surgical wound healing 04/12/2014   Overview:  Left leg   GERD (gastroesophageal reflux disease)    H/O atrial flutter 04/12/2014   H/O stroke without residual deficits 02/13/2014   H/O: substance abuse (HCC) 02/27/2014   Heart failure (HCC)    Hepatitis    history of hep c   History of CVA (cerebrovascular accident) 02/21/2014   History of delirium 04/12/2014   Hyperkalemia 11/13/2014   Hyperlipidemia    Hypertension    Hypotension 02/24/2014   Leukocytosis 02/26/2014    Myocardial infarction (HCC)    Obesity    Paroxysmal atrial fibrillation (HCC)    Peripheral vascular disease (HCC)    Postoperative anemia due to acute blood loss 02/27/2014   Renal failure    Renal insufficiency    Stroke [facility]) 2011   Superficial incisional surgical site infection 03/14/2014   Syncope and collapse 07/25/2020   Medications:  PTA Eliquis - last dose taken 12/01 PM at 2100 per patient   Assessment: [name] is a 62 year female that presented with hypotension and chest pain following her dialysis session this morning. Patient's troponin elevated to 75. Per patient, she takes Eliquis at home and last dose was taken last night around 2100. Pharmacy has been consulted for management of a heparin infusion. Baseline labs: Hgb 11.7, PLT 113, aPTT, HL, and PT/INR pending. No signs/symptoms of bleeding noted.   Goal of Therapy:  Heparin level 0.3-0.7 units/ml Monitor platelets by anticoagulation protocol: Yes   Plan:  12/3 @ 0601:  aPTT = 90,  HL = 0.95 - aPTT therapeutic X 1,  HL elevated from Eliquis PTA - Will continue pt on current rate and recheck aPTT in 8 hrs  -  Will recheck HL on 12/4 with AM labs Will monitor aPTT levels until correlation with anti-Xa levels    Monitor CBC daily while on heparin   Maxamillian Tienda D, PharmD Pharmacy Resident  05/18/2023 7:06 AM

## 2023-05-19 DIAGNOSIS — N186 End stage renal disease: Secondary | ICD-10-CM | POA: Diagnosis not present

## 2023-05-19 DIAGNOSIS — Z992 Dependence on renal dialysis: Secondary | ICD-10-CM | POA: Diagnosis not present

## 2023-05-19 DIAGNOSIS — R079 Chest pain, unspecified: Secondary | ICD-10-CM | POA: Diagnosis not present

## 2023-05-19 LAB — CBC
HCT: 32.9 % — ABNORMAL LOW (ref 36.0–46.0)
HCT: 34.6 % — ABNORMAL LOW (ref 36.0–46.0)
Hemoglobin: 10.6 g/dL — ABNORMAL LOW (ref 12.0–15.0)
Hemoglobin: 11 g/dL — ABNORMAL LOW (ref 12.0–15.0)
MCH: 31.5 pg (ref 26.0–34.0)
MCH: 31.1 pg (ref 26.0–34.0)
MCHC: 32.2 g/dL (ref 30.0–36.0)
MCHC: 31.8 g/dL (ref 30.0–36.0)
MCV: 97.9 fL (ref 80.0–100.0)
MCV: 97.7 fL (ref 80.0–100.0)
Platelets: 117 10*3/uL — ABNORMAL LOW (ref 150–400)
Platelets: 128 10*3/uL — ABNORMAL LOW (ref 150–400)
RBC: 3.36 MIL/uL — ABNORMAL LOW (ref 3.87–5.11)
RBC: 3.54 MIL/uL — ABNORMAL LOW (ref 3.87–5.11)
RDW: 15.3 % (ref 11.5–15.5)
RDW: 15.1 % (ref 11.5–15.5)
WBC: 4.7 10*3/uL (ref 4.0–10.5)
WBC: 5.3 10*3/uL (ref 4.0–10.5)
nRBC: 0 % (ref 0.0–0.2)
nRBC: 0 % (ref 0.0–0.2)

## 2023-05-19 LAB — RENAL FUNCTION PANEL
Albumin: 3.6 g/dL (ref 3.5–5.0)
Anion gap: 13 (ref 5–15)
BUN: 59 mg/dL — ABNORMAL HIGH (ref 6–20)
CO2: 25 mmol/L (ref 22–32)
Calcium: 9.5 mg/dL (ref 8.9–10.3)
Chloride: 96 mmol/L — ABNORMAL LOW (ref 98–111)
Creatinine, Ser: 8.16 mg/dL — ABNORMAL HIGH (ref 0.44–1.00)
GFR, Estimated: 5 mL/min — ABNORMAL LOW (ref >60)
Glucose, Bld: 111 mg/dL — ABNORMAL HIGH (ref 70–99)
Phosphorus: 4.4 mg/dL (ref 2.5–4.6)
Potassium: 4.7 mmol/L (ref 3.5–5.1)
Sodium: 134 mmol/L — ABNORMAL LOW (ref 135–145)

## 2023-05-19 LAB — BASIC METABOLIC PANEL
Anion gap: 15 (ref 5–15)
BUN: 56 mg/dL — ABNORMAL HIGH (ref 6–20)
CO2: 24 mmol/L (ref 22–32)
Calcium: 9.6 mg/dL (ref 8.9–10.3)
Chloride: 97 mmol/L — ABNORMAL LOW (ref 98–111)
Creatinine, Ser: 8.07 mg/dL — ABNORMAL HIGH (ref 0.44–1.00)
GFR, Estimated: 5 mL/min — ABNORMAL LOW (ref >60)
Glucose, Bld: 99 mg/dL (ref 70–99)
Potassium: 4.7 mmol/L (ref 3.5–5.1)
Sodium: 136 mmol/L (ref 135–145)

## 2023-05-19 LAB — LIPOPROTEIN A (LPA): Lipoprotein (a): 96.8 nmol/L — ABNORMAL HIGH (ref <75.0)

## 2023-05-19 MED ORDER — MUPIROCIN 2 % EX OINT: 1.0000 | TOPICAL_OINTMENT | Freq: Two times a day (BID) | CUTANEOUS | Status: AC

## 2023-05-19 MED ORDER — MIDODRINE HCL 10 MG PO TABS: 10.0000 mg | ORAL_TABLET | Freq: Three times a day (TID) | ORAL | 0 refills | Status: DC

## 2023-05-19 MED ORDER — PENTAFLUOROPROP-TETRAFLUOROETH EX AERO: 1.0000 | INHALATION_SPRAY | CUTANEOUS | Status: DC | PRN

## 2023-05-19 MED ORDER — MIDODRINE HCL 5 MG PO TABS
ORAL_TABLET | ORAL | Status: AC
  Filled 2023-05-19: qty 2

## 2023-05-19 MED ORDER — LIDOCAINE-PRILOCAINE 2.5-2.5 % EX CREA: 1.0000 | TOPICAL_CREAM | CUTANEOUS | Status: DC | PRN

## 2023-05-19 MED ORDER — ROSUVASTATIN CALCIUM 10 MG PO TABS
10.0000 mg | ORAL_TABLET | Freq: Every day | ORAL | Status: DC
  Filled 2023-05-19: qty 1

## 2023-05-19 MED ORDER — TRAZODONE HCL 50 MG PO TABS
50.0000 mg | ORAL_TABLET | Freq: Once | ORAL | Status: AC
  Administered 2023-05-19: 50 mg via ORAL
  Filled 2023-05-19: qty 1

## 2023-05-19 MED ORDER — HEPARIN SODIUM (PORCINE) 1000 UNIT/ML IJ SOLN
INTRAMUSCULAR | Status: AC
  Filled 2023-05-19: qty 10

## 2023-05-19 NOTE — Evaluation (Signed)
Physical Therapy Evaluation Patient Details Name: [name] MRN: [id number] DOB: 09/11/1960 Today's Date: 05/19/2023  History of Present Illness  Pt is a 62 yo female that presented to the ED for chest pain, low BP. Workup for bradycardia, elevated troponin. PMH of CAD, CABG, ESRD on HD, chronic HFrEF, PAF, PVD.   Clinical Impression  Pt alert, agreeable to PT with some encouragement. She reported at baseline she is modI/I; will use rollator, RW or cane depending on how she is feeling that day. Reported one fall at Easter, normally independent with ADLs but does not drive so family assists with IADLS (grocery shoppping, errands etc).   The pt demonstrated bed mobility modI. Sit<> stand without AD several times supervision. She ambulated two bouts ~15ft, with handheld assist for safety due to complaints of light headedness. HR in 50s, spO2 on room air >90%, and BP in standing with complaints of light headedness 105/70. Pt agreeable to utilize DME as needed to improve safety at discharge. The patient demonstrated and reported near return to baseline level of functioning, no further acute PT needs indicated. PT to sign off. Please reconsult PT if pt status changes or acute needs are identified.         If plan is discharge home, recommend the following: Assist for transportation   Can travel by private vehicle        Equipment Recommendations None recommended by PT  Recommendations for Other Services       Functional Status Assessment Patient has not had a recent decline in their functional status     Precautions / Restrictions Precautions Precautions: Fall Precaution Comments: watch BP Restrictions Weight Bearing Restrictions: No      Mobility  Bed Mobility Overal bed mobility: Modified Independent                  Transfers Overall transfer level: Needs assistance Equipment used: None Transfers: Sit to/from Stand Sit to Stand: Supervision                 Ambulation/Gait Ambulation/Gait assistance: Supervision, Contact guard assist Gait Distance (Feet):  (25ft x 2 bouts) Assistive device: 1 person hand held assist         General Gait Details: handheld assist for safety, pt complained of light headedness.  Stairs            Wheelchair Mobility     Tilt Bed    Modified Rankin (Stroke Patients Only)       Balance Overall balance assessment: Needs assistance Sitting-balance support: Feet supported Sitting balance-Leahy Scale: Good     Standing balance support: Single extremity supported Standing balance-Leahy Scale: Good                               Pertinent Vitals/Pain Pain Assessment Pain Assessment: No/denies pain    Home Living Family/patient expects to be discharged to:: Private residence Living Arrangements: Alone Available Help at Discharge: Family Type of Home: House Home Access: Stairs to enter;Ramped entrance Entrance Stairs-Rails: Can reach both Entrance Stairs-Number of Steps: 2   Home Layout: One level Home Equipment: Agricultural consultant (2 wheels);Rollator (4 wheels);Cane - quad      Prior Function Prior Level of Function : Independent/Modified Independent             Mobility Comments: amb with AD PRN ADLs Comments: assist for driving and grocery shopping from familiy     Extremity/Trunk Assessment  Lower Extremity Assessment Lower Extremity Assessment: Overall WFL for tasks assessed    Cervical / Trunk Assessment Cervical / Trunk Assessment: Normal  Communication      Cognition Arousal: Alert Behavior During Therapy: WFL for tasks assessed/performed Overall Cognitive Status: Within Functional Limits for tasks assessed                                          General Comments      Exercises     Assessment/Plan    PT Assessment Patient does not need any further PT services  PT Problem List         PT Treatment Interventions       PT Goals (Current goals can be found in the Care Plan section)       Frequency Min 1X/week     Co-evaluation               AM-PAC PT "6 Clicks" Mobility  Outcome Measure Help needed turning from your back to your side while in a flat bed without using bedrails?: None Help needed moving from lying on your back to sitting on the side of a flat bed without using bedrails?: None Help needed moving to and from a bed to a chair (including a wheelchair)?: None Help needed standing up from a chair using your arms (e.g., wheelchair or bedside chair)?: None Help needed to walk in hospital room?: None Help needed climbing 3-5 steps with a railing? : None 6 Click Score: 24    End of Session   Activity Tolerance: Patient tolerated treatment well Patient left: in bed;Other (comment) (with transport at bedside)   PT Visit Diagnosis: Other abnormalities of gait and mobility (R26.89)    Time: 4098-1191 PT Time Calculation (min) (ACUTE ONLY): 19 min   Charges:   PT Evaluation $PT Eval Low Complexity: 1 Low PT Treatments $Therapeutic Activity: 8-22 mins PT General Charges $$ ACUTE PT VISIT: 1 Visit         Olga Coaster PT, DPT 10:01 AM,05/19/23

## 2023-05-19 NOTE — Progress Notes (Signed)
Transition of Care [facility]) - Inpatient Brief Assessment   Patient Details  Name: [name] MRN: [id number] Date of Birth: 1961-03-08  [facility] CM/SW Contact:    Truddie Hidden, RN Phone Number: 05/19/2023, 12:15 PM   Clinical Narrative: TOC continuing to follow patient's progress throughout discharge planning.   Transition of Care Asessment: Insurance and Status: Insurance coverage has been reviewed Patient has primary care physician: Yes Home environment has been reviewed: Home Prior level of function:: Independent Prior/Current Home Services: No current home services Social Determinants of Health Reivew: SDOH reviewed no interventions necessary Readmission risk has been reviewed: Yes Transition of care needs: no transition of care needs at this time

## 2023-05-19 NOTE — Discharge Summary (Addendum)
Physician Discharge Summary   Patient: [name] MRN: [id number] DOB: [birth year]  Admit date:     05/17/2023  Discharge date: 05/19/23  Discharge Physician: Lurene Shadow   PCP: Miki Kins, FNP   Recommendations at discharge:   Follow up with PCP in 1 week Follow-up with Dr. Welton Flakes, cardiologist, in 1 to 2 weeks  Discharge Diagnoses: Principal Problem:   Chest pain Active Problems:   ESRD on hemodialysis (HCC)   S/P CABG x 3  Resolved Problems:   * No resolved hospital problems. *  Hospital Course:  [name] is a 62 y.o. female  with medical history significant of CAD status post CABG x 3 with chronic stable angina, ESRD on HD MWF, HD associated hypotension on midodrine, chronic HFrEF with LVEF 20-25%, PAF and PVD on Eliquis, sent from HD center for evaluation of new onset chest pain. She woke up, on the day of admission, feeling short of breath and lightheaded.  She went to an outpatient hemodialysis when she was found to be hypotensive.  Subsequently, she developed chest pain as there was shortness of breath.  She was referred to the ED for further management.  In the ED, she was bradycardic with heart rate in the 40s and 50s and was hypotensive with BP of 80/50 on arrival. BP improved with IV fluids.   Assessment and Plan:   Hypotension: Discontinue losartan at discharge.  Continue midodrine.   Paroxysmal atrial fibrillation: Amiodarone discontinued because of bradycardia.  Continue Eliquis   CAD: Acute NSTEMI has been ruled out.  Continue Ranexa. Elevated troponins (75, 68) attributed to demand ischemia. She was evaluated by the cardiologist.  Patient does not want to have cardiac catheterization   ESRD: Outpatient follow-up with nephrologist for hemodialysis.   Chronic systolic CHF: Compensated. Unable to tolerate guideline-directed medical therapy because of hypotension and bradycardia 2D echo shows EF estimated at 25 to 30%, moderate LVH,  grade 2 diastolic dysfunction, severely reduced RV systolic function, severely elevated pulmonary artery systolic pressure, mild to moderate MR, moderate TR   Comorbidities include PVD, history of stroke   Her condition has improved and she is deemed stable for discharge to home.     Consultants: Nephrologist, cardiologist Procedures performed: None Disposition: Home Diet recommendation:  Discharge Diet Orders (From admission, onward)     Start     Ordered   05/19/23 0000  Diet renal with fluid restriction        05/19/23 1429           Renal diet DISCHARGE MEDICATION: Allergies as of 05/19/2023       Reactions   Morphine And Codeine Hives   Levaquin [levofloxacin] Itching   Severe itching; prickly sensation   Enalapril Other (See Comments)   hallucinations   Latex Rash   Tape Rash, Other (See Comments)        Medication List     STOP taking these medications    amiodarone 200 MG tablet Commonly known as: PACERONE   doxycycline 100 MG capsule Commonly known as: VIBRAMYCIN   lidocaine-prilocaine cream Commonly known as: EMLA   losartan 25 MG tablet Commonly known as: COZAAR   pantoprazole 40 MG tablet Commonly known as: PROTONIX       TAKE these medications    albuterol 108 (90 Base) MCG/ACT inhaler Commonly known as: VENTOLIN HFA Inhale 1-2 puffs into the lungs every 6 (six) hours as needed for wheezing or shortness of breath.   Eliquis 5  MG Tabs tablet Generic drug: apixaban Take 5 mg by mouth 2 (two) times daily.   hydrOXYzine 50 MG tablet Commonly known as: ATARAX Take 50 mg by mouth at bedtime.   levothyroxine 75 MCG tablet Commonly known as: SYNTHROID Take 75 mcg by mouth daily before breakfast.   midodrine 10 MG tablet Commonly known as: PROAMATINE Take 1 tablet (10 mg total) by mouth 3 (three) times daily. Monday, Wednesday, Friday with hemodialysis What changed: when to take this   mupirocin ointment 2 % Commonly known as:  BACTROBAN Place 1 Application into the nose 2 (two) times daily for 4 days.   omeprazole 20 MG capsule Commonly known as: PRILOSEC Take 20 mg by mouth daily.   oxyCODONE-acetaminophen 5-325 MG tablet Commonly known as: PERCOCET/ROXICET Take 1 tablet by mouth every 6 (six) hours as needed for severe pain (pain score 7-10).   ranolazine 1000 MG SR tablet Commonly known as: RANEXA Take 1,000 mg by mouth 2 (two) times daily.   rosuvastatin 20 MG tablet Commonly known as: CRESTOR Take 20 mg by mouth at bedtime.   sevelamer carbonate 800 MG tablet Commonly known as: RENVELA Take 3,200 mg by mouth 3 (three) times daily with meals.   Veltassa 8.4 g packet Generic drug: patiromer Take 1 packet by mouth daily.   Xeroform Petrolat Patch 2"x2" Pads Apply 1 each topically every 3 (three) days.        Patient was seen and examined at the hemodialysis unit. Elon Jester, LPN, was at the bedside  Discharge Exam: Filed Weights   05/18/23 1700 05/19/23 0929 05/19/23 1255  Weight: 67.3 kg 70.7 kg 69.6 kg   GEN: NAD SKIN: Warm and dry EYES: No pallor or icterus ENT: MMM CV: RRR PULM: Mild bibasilar rales, no wheezing ABD: soft, ND, NT, +BS CNS: AAO x 3, non focal EXT: No edema or tenderness   Condition at discharge: good  The results of significant diagnostics from this hospitalization (including imaging, microbiology, ancillary and laboratory) are listed below for reference.   Imaging Studies: ECHOCARDIOGRAM COMPLETE  Result Date: 05/17/2023    ECHOCARDIOGRAM REPORT   Patient Name:   [name] [facility] Date of Exam: 05/17/2023 Medical Rec #:  [id number]         Height:       66.0 in Accession #:    [id number]        Weight:       162.0 lb Date of Birth:  12/30/1960          BSA:          1.828 m Patient Age:    62 years          BP:           103/76 mmHg Patient Gender: F                 HR:           51 bpm. Exam Location:  [facility] Procedure: 2D Echo, 3D Echo, Cardiac Doppler, Color  Doppler and Strain Analysis Indications:     NSTEMI  History:         Patient has prior history of Echocardiogram examinations, most                  recent 12/22/2021. CHF, Acute MI and Angina, Prior CABG,                  Arrythmias:Atrial Fibrillation; Risk Factors:Hypertension,  Dyslipidemia and Former Smoker. ESRD on Dialysis.  Sonographer:     Mikki Harbor Referring Phys:  [id number] Emeline General Diagnosing Phys: Adrian Blackwater  Sonographer Comments: Global longitudinal strain was attempted. IMPRESSIONS  1. Left ventricular ejection fraction, by estimation, is 25 to 30%. The left ventricle has severely decreased function. The left ventricle demonstrates global hypokinesis. The left ventricular internal cavity size was severely dilated. There is moderate  left ventricular hypertrophy. Left ventricular diastolic parameters are consistent with Grade II diastolic dysfunction (pseudonormalization).  2. Right ventricular systolic function is severely reduced. The right ventricular size is severely enlarged. Mildly increased right ventricular wall thickness. There is severely elevated pulmonary artery systolic pressure.  3. Left atrial size was severely dilated.  4. Right atrial size was mildly dilated.  5. The mitral valve is grossly normal. Mild to moderate mitral valve regurgitation.  6. Tricuspid valve regurgitation is moderate.  7. The aortic valve is calcified. Aortic valve regurgitation is not visualized. FINDINGS  Left Ventricle: Left ventricular ejection fraction, by estimation, is 25 to 30%. The left ventricle has severely decreased function. The left ventricle demonstrates global hypokinesis. The left ventricular internal cavity size was severely dilated. There is moderate left ventricular hypertrophy. Left ventricular diastolic parameters are consistent with Grade II diastolic dysfunction (pseudonormalization). Right Ventricle: The right ventricular size is severely enlarged. Mildly  increased right ventricular wall thickness. Right ventricular systolic function is severely reduced. There is severely elevated pulmonary artery systolic pressure. The tricuspid regurgitant velocity is 3.80 m/s, and with an assumed right atrial pressure of 15 mmHg, the estimated right ventricular systolic pressure is 72.8 mmHg. Left Atrium: Left atrial size was severely dilated. Right Atrium: Right atrial size was mildly dilated. Pericardium: There is no evidence of pericardial effusion. Mitral Valve: The mitral valve is grossly normal. Mild to moderate mitral valve regurgitation. MV peak gradient, 4.8 mmHg. The mean mitral valve gradient is 1.0 mmHg. Tricuspid Valve: The tricuspid valve is grossly normal. Tricuspid valve regurgitation is moderate. Aortic Valve: The aortic valve is calcified. Aortic valve regurgitation is not visualized. Aortic valve mean gradient measures 4.5 mmHg. Aortic valve peak gradient measures 9.7 mmHg. Aortic valve area, by VTI measures 2.21 cm. Pulmonic Valve: The pulmonic valve was grossly normal. Pulmonic valve regurgitation is not visualized. Aorta: The aortic root, ascending aorta and aortic arch are all structurally normal, with no evidence of dilitation or obstruction. IAS/Shunts: No atrial level shunt detected by color flow Doppler.  LEFT VENTRICLE PLAX 2D LVIDd:         6.70 cm      Diastology LVIDs:         5.80 cm      LV e' lateral:   8.68 cm/s LV PW:         0.90 cm      LV E/e' lateral: 11.4 LV IVS:        1.40 cm LVOT diam:     2.10 cm LV SV:         67 LV SV Index:   36 LVOT Area:     3.46 cm  LV Volumes (MOD) LV vol d, MOD A2C: 204.0 ml LV vol d, MOD A4C: 167.0 ml LV vol s, MOD A2C: 141.0 ml LV vol s, MOD A4C: 125.0 ml LV SV MOD A2C:     63.0 ml LV SV MOD A4C:     167.0 ml LV SV MOD BP:      46.6 ml RIGHT VENTRICLE RV Basal diam:  4.15 cm RV Mid diam:    3.10 cm RV S prime:     7.05 cm/s TAPSE (M-mode): 1.5 cm LEFT ATRIUM              Index        RIGHT ATRIUM            Index LA diam:        5.60 cm  3.06 cm/m   RA Area:     33.30 cm LA Vol (A2C):   84.7 ml  46.32 ml/m  RA Volume:   132.00 ml 72.19 ml/m LA Vol (A4C):   107.0 ml 58.52 ml/m LA Biplane Vol: 96.9 ml  52.99 ml/m  AORTIC VALVE                    PULMONIC VALVE AV Area (Vmax):    2.10 cm     PV Vmax:       0.80 m/s AV Area (Vmean):   1.91 cm     PV Peak grad:  2.6 mmHg AV Area (VTI):     2.21 cm AV Vmax:           155.50 cm/s AV Vmean:          99.300 cm/s AV VTI:            0.301 m AV Peak Grad:      9.7 mmHg AV Mean Grad:      4.5 mmHg LVOT Vmax:         94.30 cm/s LVOT Vmean:        54.800 cm/s LVOT VTI:          0.192 m LVOT/AV VTI ratio: 0.64  AORTA Ao Root diam: 3.20 cm MITRAL VALVE               TRICUSPID VALVE MV Area (PHT): 6.17 cm    TR Peak grad:   57.8 mmHg MV Area VTI:   1.75 cm    TR Vmax:        380.00 cm/s MV Peak grad:  4.8 mmHg MV Mean grad:  1.0 mmHg    SHUNTS MV Vmax:       1.10 m/s    Systemic VTI:  0.19 m MV Vmean:      45.2 cm/s   Systemic Diam: 2.10 cm MV Decel Time: 123 msec MV E velocity: 99.10 cm/s MV A velocity: 35.50 cm/s MV E/A ratio:  2.79 Shaukat Khan Electronically signed by Adrian Blackwater Signature Date/Time: 05/17/2023/2:04:57 PM    Final    DG Chest Portable 1 View  Result Date: 05/17/2023 CLINICAL DATA:  Shortness of breath and chest pain.  On dialysis. EXAM: PORTABLE CHEST 1 VIEW COMPARISON:  Chest radiographs 01/30/2022 and CT 02/16/2022 FINDINGS: A right jugular dialysis catheter terminates near the superior cavoatrial junction. Sequelae of CABG are again identified. The cardiac silhouette remains moderately to severely enlarged. There is mild central pulmonary vascular congestion without overt edema. Minimal left basilar opacity likely reflects atelectasis. No sizable pleural effusion or pneumothorax is identified. No acute osseous abnormality is seen. IMPRESSION: Cardiomegaly and mild pulmonary vascular congestion. Electronically Signed   By: Sebastian Ache M.D.   On:  05/17/2023 08:26   US Abdomen Limited  Result Date: 04/30/2023 CLINICAL DATA:  History of ascites and multiple prior paracentesis procedures. The most recent was performed on 04/12/2023. Evaluation for ascites prior to possible paracentesis. EXAM: LIMITED ABDOMEN ULTRASOUND FOR ASCITES TECHNIQUE: Limited ultrasound survey for  ascites was performed in all four abdominal quadrants. COMPARISON:  04/20/2023 FINDINGS: A small amount of ascites is present in the right peritoneal cavity. A large enough pocket was not present to allow for safe paracentesis today. IMPRESSION: Small amount of ascites in the right peritoneal cavity. A large enough pocket was not present to allow for safe paracentesis today. Electronically Signed   By: Irish Lack M.D.   On: 04/30/2023 13:55   VAS [place] UPPER EXT VEIN MAPPING (PRE-OP AVF)  Result Date: 04/30/2023 UPPER EXTREMITY VEIN MAPPING Patient Name:  [name]  Date of Exam:   04/27/2023 Medical Rec #: [id number]          Accession #:    [id number] Date of Birth: 06/06/1961           Patient Gender: F Patient Age:   38 years Exam Location:  [facility] Procedure:      VAS [place] UPPER EXT VEIN MAPPING (PRE-OP AVF) Referring Phys: Festus Barren --------------------------------------------------------------------------------  History: 02/11/2023 PTA mid upper arm access site           11/2022 PTA of confluence.           08/20/2022 Percutaneous transluminal angioplasty of left          basilic/axillary junction with 9 mm diameter by 6 cm length Lutonix          drug-coated          angioplasty balloon           History of Artegraft jump graft revision.  Performing Technologist: Hardie Lora RVT  Examination Guidelines: A complete evaluation includes B-mode imaging, spectral Doppler, color Doppler, and power Doppler as needed of all accessible portions of each vessel. Bilateral testing is considered an integral part of a complete examination. Limited examinations for  reoccurring indications may be performed as noted. +-----------------+-------------+----------+----------------+ Right Cephalic   Diameter (cm)Depth (cm)    Findings     +-----------------+-------------+----------+----------------+ Shoulder             0.14                                +-----------------+-------------+----------+----------------+ Prox upper arm       0.15                                +-----------------+-------------+----------+----------------+ Mid upper arm        0.11                                +-----------------+-------------+----------+----------------+ Dist upper arm       0.10               chronic thrombus +-----------------+-------------+----------+----------------+ Antecubital fossa    0.11               chronic thrombus +-----------------+-------------+----------+----------------+ Prox forearm                             not visualized  +-----------------+-------------+----------+----------------+ Mid forearm                              not visualized  +-----------------+-------------+----------+----------------+ Dist forearm  not visualized  +-----------------+-------------+----------+----------------+ +-----------------+-------------+----------+--------+ Right Basilic    Diameter (cm)Depth (cm)Findings +-----------------+-------------+----------+--------+ Prox upper arm       0.43                        +-----------------+-------------+----------+--------+ Mid upper arm        0.33                        +-----------------+-------------+----------+--------+ Dist upper arm       0.21                        +-----------------+-------------+----------+--------+ Antecubital fossa    0.20                        +-----------------+-------------+----------+--------+ Prox forearm         0.24                        +-----------------+-------------+----------+--------+ The confluence of  the basilic and brachial veins is at the distal upper arm. Left arm not imaged, multiple prior dialysis access. Summary: Right: The cephalic vein appears to be chronically thrombosed at the        distal upper arm and antecubital fossa.         Basilic vein is patent with diameters as described above. The        basilic vein originates off the brachial vein in the distal        third of the upper arm. Left: Not imaged. Multiple failed dialysis access. *See table(s) above for measurements and observations.  Diagnosing physician: Festus Barren MD Electronically signed by Festus Barren MD on 04/30/2023 at 7:29:22 AM.    Final    VAS [place] UPPER EXTREMITY ARTERIAL DUPLEX  Result Date: 04/30/2023  UPPER EXTREMITY DUPLEX STUDY Patient Name:  [name]  Date of Exam:   04/27/2023 Medical Rec #: [id number]          Accession #:    [id number] Date of Birth: 1960/08/06           Patient Gender: F Patient Age:   85 years Exam Location:  [facility] Procedure:      VAS [place] UPPER EXTREMITY ARTERIAL DUPLEX Referring Phys: Festus Barren --------------------------------------------------------------------------------  Indications: Evaluation prior to placement of dialysis access.  Other Factors: 02/11/2023 PTA mid upper arm access site                11/2022 PTA of confluence.                 08/20/2022 Percutaneous transluminal angioplasty of left                basilic/axillary junction with 9 mm diameter by 6 cm length                Lutonix drug-coated angioplasty balloon                 History of Artegraft jump graft revision. Performing Technologist: Hardie Lora RVT  Examination Guidelines: A complete evaluation includes B-mode imaging, spectral Doppler, color Doppler, and power Doppler as needed of all accessible portions of each vessel. Bilateral testing is considered an integral part of a complete examination. Limited examinations for reoccurring indications may be performed as noted.  Right Doppler Findings:  +---------------+----------+---------+--------+--------+ Site  PSV (cm/s)Waveform StenosisComments +---------------+----------+---------+--------+--------+ Subclavian Dist          biphasic                  +---------------+----------+---------+--------+--------+ Brachial Dist  58        triphasic                 +---------------+----------+---------+--------+--------+ Radial Mid     38        biphasic                  +---------------+----------+---------+--------+--------+ Radial Dist    17        biphasic                  +---------------+----------+---------+--------+--------+ Ulnar Dist     73        triphasic                 +---------------+----------+---------+--------+--------+   Right Pre-Dialysis Findings: +----------------------------+---------+-------------------+----------+--------+ Location                    PSV      Intralum. Diam.    Waveform  Comments                             (cm/s)   (cm)                                  +----------------------------+---------+-------------------+----------+--------+ Ulnar artery distal upper   58       0.41               Triphasic          arm                                                                        +----------------------------+---------+-------------------+----------+--------+ Brachial Antecub. fossa     17       0.18               biphasic           +----------------------------+---------+-------------------+----------+--------+ Radial Art at Wrist         73       0.19               triphasic          +----------------------------+---------+-------------------+----------+--------+  Left Doppler Findings: +----+----------+--------+--------+--------+ SitePSV (cm/s)WaveformStenosisComments +----+----------+--------+--------+--------+ Left arm not imaged. Multiple prior dialysis access.  Summary:  Right: No obstruction visualized in the right upper extremity.  *See table(s) above for measurements and observations. Electronically signed by Festus Barren MD on 04/30/2023 at 7:29:16 AM.    Final    [place] ASCITES (ABDOMEN LIMITED)  Result Date: 04/20/2023 INDICATION: 62 year old female. History of end-stage renal disease with recurrent ascites. Request is for therapeutic paracentesis EXAM: ULTRASOUND ABDOMEN LIMITED FINDINGS: Imaging of all 4 quadrants of the abdomen reveals trace ascites. IMPRESSION: No significant ascites seen by ultrasound. No paracentesis performed. Read by: Anders Grant, NP Electronically Signed   By: Olive Bass M.D.   On: 04/20/2023 13:42    Microbiology: Results for orders placed or performed during the hospital encounter  of 05/17/23  MRSA Next Gen by PCR, Nasal     Status: Abnormal   Collection Time: 05/18/23  2:57 AM   Specimen: Nasal Mucosa; Nasal Swab  Result Value Ref Range Status   MRSA by PCR Next Gen DETECTED (A) NOT DETECTED Final    Comment: RESULT CALLED TO, READ BACK BY AND VERIFIED WITH:  MARCEL TURNER AT 0506 05/18/23 JG (NOTE) The GeneXpert MRSA Assay (FDA approved for NASAL specimens only), is one component of a comprehensive MRSA colonization surveillance program. It is not intended to diagnose MRSA infection nor to guide or monitor treatment for MRSA infections. Test performance is not FDA approved in patients less than 58 years old. Performed at [facility], [address] [place], [place] [address]     Labs: CBC: Recent Labs  Lab 05/17/23 [phone] 05/18/23 0601 05/18/23 1424 05/19/23 0743 05/19/23 0940  WBC 4.9 4.5 4.2 4.7 5.3  NEUTROABS 3.1  --   --   --   --   HGB 11.7* 10.7* 10.4* 10.6* 11.0*  HCT 37.1 32.8* 32.4* 32.9* 34.6*  MCV 99.7 96.8 98.5 97.9 97.7  PLT 113* 116* 117* 117* 128*   Basic Metabolic Panel: Recent Labs  Lab 05/17/23 0747 05/18/23 1424 05/19/23 0743 05/19/23 0940  NA 137 136 136 134*  K 5.0 4.2 4.7 4.7  CL 100 98 97* 96*  CO2 22 26 24 25   GLUCOSE  102* 117* 99 111*  BUN 64* 47* 56* 59*  CREATININE 8.54* 6.50* 8.07* 8.16*  CALCIUM 9.6 8.7* 9.6 9.5  PHOS  --  4.2  --  4.4   Liver Function Tests: Recent Labs  Lab 05/17/23 0747 05/18/23 1424 05/19/23 0940  AST 24  --   --   ALT 16  --   --   ALKPHOS 105  --   --   BILITOT 0.8  --   --   PROT 7.7  --   --   ALBUMIN 3.6 3.4* 3.6   CBG: No results for input(s): "GLUCAP" in the last 168 hours.  Discharge time spent: greater than 30 minutes.  Signed: Lurene Shadow, MD [facility] 05/19/2023

## 2023-05-19 NOTE — Progress Notes (Signed)
    05/19/23 0031  Provider Notification  Provider Name/Title Dr. Larkin Ina  Date Provider Notified 05/19/23  Time Provider Notified 0010  Method of Notification Face-to-face;Page; and Call  Notification Reason Change in status. Pt has active chest pain, unrelieved after Nitroglycerine 0.4 mg x 3 doses sublingual. Her pain is described as sharp stabbing pain and radiated to her left chest and right shoulder. Stat EKG 12 leads shows Sinus bradycardia with sinus arrhythmia with 1st degree A-V block with occasional Premature ventricular complexes, Right bundle branch block Inferior infarct , age undetermined, which is not changed from the previous EKG. Her BP stable. On 4 LPMof O2 NCL, no respiratory distress. Pt appears anxious with anxiety and diaphoresis.  Provider response Evaluate remotely. New orders for continue to monitor. Extra dose of Percocet and Trazodone stat. Please notify if active chest pain is worsen.   Date of Provider Response 05/19/23  Time of Provider Response 0025    Filiberto Pinks, RN

## 2023-05-19 NOTE — Progress Notes (Signed)
This RN provided discharge instructions and teaching to the patient. The patient verbalized and demonstrated understanding of the provided instructions. All outstanding questions resolved. Cannula intact. Pt tolerated well. All belongings packed and in tow. Okey Regal, CNA transported patient to private vehicle via wheelchair to facilitate departure.

## 2023-05-19 NOTE — Evaluation (Signed)
Occupational Therapy Evaluation Patient Details Name: [name] MRN: [id number] DOB: March 09, 1961 Today's Date: 05/19/2023   History of Present Illness Pt is a 62 yo female that presented to the ED for chest pain, low BP. Workup for bradycardia, elevated troponin. PMH of CAD, CABG, ESRD on HD, chronic HFrEF, PAF, PVD.   Clinical Impression   Chart reviewed, hand off from PT after mobility. PTA pt reports she is MOD I with ADL, assist Prn for IADL and amb with AE PRN. Pt is performing Adl with Set up-supervision on this date, amb approx 80' in hallway with PT prior to OT entering room. Pt reports she feels she is at/close to her baseline. Recommend pt continue to participate in ADLs as tolerated with nursing staff. OT will sign off. Please re consult if there is a change in functional status.     If plan is discharge home, recommend the following: Assistance with cooking/housework    Functional Status Assessment  Patient has not had a recent decline in their functional status  Equipment Recommendations  None recommended by OT    Recommendations for Other Services       Precautions / Restrictions Precautions Precautions: Fall Precaution Comments: watch BP Restrictions Weight Bearing Restrictions: No      Mobility Bed Mobility Overal bed mobility: Modified Independent                  Transfers Overall transfer level: Needs assistance Equipment used: None Transfers: Sit to/from Stand Sit to Stand: Supervision                  Balance Overall balance assessment: Needs assistance Sitting-balance support: Feet supported Sitting balance-Leahy Scale: Good     Standing balance support: Single extremity supported Standing balance-Leahy Scale: Good                             ADL either performed or assessed with clinical judgement   ADL Overall ADL's : Needs assistance/impaired Eating/Feeding: Set up               Upper Body Dressing  : Supervision/safety   Lower Body Dressing: Supervision/safety   Toilet Transfer: Supervision/safety   Toileting- Clothing Manipulation and Hygiene: Supervision/safety Toileting - Clothing Manipulation Details (indicate cue type and reason): anticipate     Functional mobility during ADLs: Supervision/safety       Vision Patient Visual Report: No change from baseline       Perception         Praxis         Pertinent Vitals/Pain Pain Assessment Pain Assessment: No/denies pain     Extremity/Trunk Assessment Upper Extremity Assessment Upper Extremity Assessment: RUE deficits/detail RUE Deficits / Details: R pinky with fixed flexion contracture at PIP- reports she broke it last year; swan neck deformitied noted in pointer, middle, ring finger   Lower Extremity Assessment Lower Extremity Assessment: Overall WFL for tasks assessed   Cervical / Trunk Assessment Cervical / Trunk Assessment: Normal   Communication Communication Communication: No apparent difficulties Cueing Techniques: Verbal cues   Cognition Arousal: Alert Behavior During Therapy: WFL for tasks assessed/performed                                         General Comments  vss throughout; BP 105/70 (MAP 82) after amb with PT  Exercises Other Exercises Other Exercises: edu re: role of OT, role of rehab   Shoulder Instructions      Home Living Family/patient expects to be discharged to:: Private residence Living Arrangements: Alone Available Help at Discharge: Family Type of Home: House Home Access: Stairs to enter;Ramped entrance Entrance Stairs-Number of Steps: 2 Entrance Stairs-Rails: Can reach both Home Layout: One level               Home Equipment: Agricultural consultant (2 wheels);Rollator (4 wheels);Cane - quad          Prior Functioning/Environment Prior Level of Function : Independent/Modified Independent             Mobility Comments: amb with AD PRN ADLs  Comments: assist for driving and grocery shopping from familiy        OT Problem List:        OT Treatment/Interventions:      OT Goals(Current goals can be found in the care plan section)    OT Frequency:      Co-evaluation PT/OT/SLP Co-Evaluation/Treatment:  (hand off due to time constraints, pt with HD appt)            AM-PAC OT "6 Clicks" Daily Activity     Outcome Measure Help from another person eating meals?: None Help from another person taking care of personal grooming?: None Help from another person toileting, which includes using toliet, bedpan, or urinal?: None Help from another person bathing (including washing, rinsing, drying)?: None Help from another person to put on and taking off regular upper body clothing?: None Help from another person to put on and taking off regular lower body clothing?: None 6 Click Score: 24   End of Session    Activity Tolerance: Patient tolerated treatment well Patient left: in bed;with call bell/phone within reach (with transport to go to HD)                   Time: 1478-2956 OT Time Calculation (min): 9 min Charges:  OT General Charges $OT Visit: 1 Visit OT Evaluation $OT Eval Low Complexity: 1 Low  Oleta Mouse, OTD OTR/L  05/19/23, 12:58 PM

## 2023-05-19 NOTE — Progress Notes (Signed)
  Received patient in bed to unit.   Informed consent signed and in chart.    TX duration:3 hrs     Transported back to floor  Hand-off given to patient's nurse. No c/o and no distress noted    Access used: R HD Catheter Access issues: none   Total UF removed: 0.7L Medication(s) given: midodrine  Post HD weight: 69.6kg     Lynann Beaver  [facility]

## 2023-05-19 NOTE — Progress Notes (Signed)
[facility]  ROUNDING NOTE   Subjective:   [name] is a 62 year old African-American female with past medical histories including CAD with CABG and end-stage renal disease on dialysis. Patient presents to the emergency department with complaints of chest pain and low BP. She has been admitted for Bradycardia [R00.1] Elevated troponin [R79.89] NSTEMI (non-ST elevated myocardial infarction) [facility]) [I21.4] Chest pain, unspecified type [R07.9]  Patient is known to our clinic and receives outpatient dialysis treatments at [facility] on MWF schedule, supervised by Dr. Thedore Mins.  Patient seen and evaluated during dialysis   HEMODIALYSIS FLOWSHEET:  Blood Flow Rate (mL/min): 300 mL/min Arterial Pressure (mmHg): -133.32 mmHg Venous Pressure (mmHg): 137.57 mmHg TMP (mmHg): 25.86 mmHg Ultrafiltration Rate (mL/min): 933 mL/min Dialysate Flow Rate (mL/min): 300 ml/min States she feels better today Denies shortness of breath or weakness.    Objective:  Vital signs in last 24 hours:  Temp:  [97.4 F (36.3 C)-98 F (36.7 C)] 97.6 F (36.4 C) (12/04 0922) Pulse Rate:  [51-74] 74 (12/04 1000) Resp:  [12-21] 21 (12/04 1000) BP: (98-143)/(50-85) 120/80 (12/04 1000) SpO2:  [97 %-100 %] 100 % (12/04 1000) Weight:  [67.3 kg-71.3 kg] 70.7 kg (12/04 0929)  Weight change: -2.183 kg Filed Weights   05/18/23 1349 05/18/23 1700 05/19/23 0929  Weight: 71.3 kg 67.3 kg 70.7 kg    Intake/Output: I/O last 3 completed shifts: In: 1565.3 [P.O.:1410; I.V.:155.3] Out: 2500 [Other:2500]   Intake/Output this shift:  No intake/output data recorded.  Physical Exam: General: NAD  Head: Normocephalic, atraumatic. Moist oral mucosal membranes  Eyes: Anicteric  Lungs:  Clear to auscultation, normal effort  Heart: Regular rate and rhythm  Abdomen:  Soft, nontender  Extremities: No peripheral edema.  Neurologic: Alert and oriented, moving all four extremities  Skin: No  lesions  Access: Left chest PermCath    Basic Metabolic Panel: Recent Labs  Lab 05/17/23 0747 05/18/23 1424 05/19/23 0743  NA 137 136 136  K 5.0 4.2 4.7  CL 100 98 97*  CO2 22 26 24   GLUCOSE 102* 117* 99  BUN 64* 47* 56*  CREATININE 8.54* 6.50* 8.07*  CALCIUM 9.6 8.7* 9.6  PHOS  --  4.2  --     Liver Function Tests: Recent Labs  Lab 05/17/23 0747 05/18/23 1424  AST 24  --   ALT 16  --   ALKPHOS 105  --   BILITOT 0.8  --   PROT 7.7  --   ALBUMIN 3.6 3.4*   No results for input(s): "LIPASE", "AMYLASE" in the last 168 hours. No results for input(s): "AMMONIA" in the last 168 hours.  CBC: Recent Labs  Lab 05/17/23 0747 05/18/23 0601 05/18/23 1424 05/19/23 0743 05/19/23 0940  WBC 4.9 4.5 4.2 4.7 5.3  NEUTROABS 3.1  --   --   --   --   HGB 11.7* 10.7* 10.4* 10.6* 11.0*  HCT 37.1 32.8* 32.4* 32.9* 34.6*  MCV 99.7 96.8 98.5 97.9 97.7  PLT 113* 116* 117* 117* 128*    Cardiac Enzymes: No results for input(s): "CKTOTAL", "CKMB", "CKMBINDEX", "TROPONINI" in the last 168 hours.  BNP: Invalid input(s): "POCBNP"  CBG: No results for input(s): "GLUCAP" in the last 168 hours.  Microbiology: Results for orders placed or performed during the hospital encounter of 05/17/23  MRSA Next Gen by PCR, Nasal     Status: Abnormal   Collection Time: 05/18/23  2:57 AM   Specimen: Nasal Mucosa; Nasal Swab  Result Value Ref  Range Status   MRSA by PCR Next Gen DETECTED (A) NOT DETECTED Final    Comment: RESULT CALLED TO, READ BACK BY AND VERIFIED WITH:  MARCEL TURNER AT 0506 05/18/23 JG (NOTE) The GeneXpert MRSA Assay (FDA approved for NASAL specimens only), is one component of a comprehensive MRSA colonization surveillance program. It is not intended to diagnose MRSA infection nor to guide or monitor treatment for MRSA infections. Test performance is not FDA approved in patients less than 2 years old. Performed at [facility], [address]  [place], [place] [address]     Coagulation Studies: Recent Labs    05/17/23 0747  LABPROT 18.9*  INR 1.6*    Urinalysis: No results for input(s): "COLORURINE", "LABSPEC", "PHURINE", "GLUCOSEU", "HGBUR", "BILIRUBINUR", "KETONESUR", "PROTEINUR", "UROBILINOGEN", "NITRITE", "LEUKOCYTESUR" in the last 72 hours.  Invalid input(s): "APPERANCEUR"    Imaging: ECHOCARDIOGRAM COMPLETE  Result Date: 05/17/2023    ECHOCARDIOGRAM REPORT   Patient Name:   [name] [facility] Date of Exam: 05/17/2023 Medical Rec #:  [id number]         Height:       66.0 in Accession #:    [id number]        Weight:       162.0 lb Date of Birth:  Feb 27, 1961          BSA:          1.828 m Patient Age:    62 years          BP:           103/76 mmHg Patient Gender: F                 HR:           51 bpm. Exam Location:  [facility] Procedure: 2D Echo, 3D Echo, Cardiac Doppler, Color Doppler and Strain Analysis Indications:     NSTEMI  History:         Patient has prior history of Echocardiogram examinations, most                  recent 12/22/2021. CHF, Acute MI and Angina, Prior CABG,                  Arrythmias:Atrial Fibrillation; Risk Factors:Hypertension,                  Dyslipidemia and Former Smoker. ESRD on Dialysis.  Sonographer:     Mikki Harbor Referring Phys:  [id number] Emeline General Diagnosing Phys: Adrian Blackwater  Sonographer Comments: Global longitudinal strain was attempted. IMPRESSIONS  1. Left ventricular ejection fraction, by estimation, is 25 to 30%. The left ventricle has severely decreased function. The left ventricle demonstrates global hypokinesis. The left ventricular internal cavity size was severely dilated. There is moderate  left ventricular hypertrophy. Left ventricular diastolic parameters are consistent with Grade II diastolic dysfunction (pseudonormalization).  2. Right ventricular systolic function is severely reduced. The right ventricular size is severely enlarged. Mildly increased right ventricular wall thickness.  There is severely elevated pulmonary artery systolic pressure.  3. Left atrial size was severely dilated.  4. Right atrial size was mildly dilated.  5. The mitral valve is grossly normal. Mild to moderate mitral valve regurgitation.  6. Tricuspid valve regurgitation is moderate.  7. The aortic valve is calcified. Aortic valve regurgitation is not visualized. FINDINGS  Left Ventricle: Left ventricular ejection fraction, by estimation, is 25 to 30%. The left ventricle has severely decreased function. The left  ventricle demonstrates global hypokinesis. The left ventricular internal cavity size was severely dilated. There is moderate left ventricular hypertrophy. Left ventricular diastolic parameters are consistent with Grade II diastolic dysfunction (pseudonormalization). Right Ventricle: The right ventricular size is severely enlarged. Mildly increased right ventricular wall thickness. Right ventricular systolic function is severely reduced. There is severely elevated pulmonary artery systolic pressure. The tricuspid regurgitant velocity is 3.80 m/s, and with an assumed right atrial pressure of 15 mmHg, the estimated right ventricular systolic pressure is 72.8 mmHg. Left Atrium: Left atrial size was severely dilated. Right Atrium: Right atrial size was mildly dilated. Pericardium: There is no evidence of pericardial effusion. Mitral Valve: The mitral valve is grossly normal. Mild to moderate mitral valve regurgitation. MV peak gradient, 4.8 mmHg. The mean mitral valve gradient is 1.0 mmHg. Tricuspid Valve: The tricuspid valve is grossly normal. Tricuspid valve regurgitation is moderate. Aortic Valve: The aortic valve is calcified. Aortic valve regurgitation is not visualized. Aortic valve mean gradient measures 4.5 mmHg. Aortic valve peak gradient measures 9.7 mmHg. Aortic valve area, by VTI measures 2.21 cm. Pulmonic Valve: The pulmonic valve was grossly normal. Pulmonic valve regurgitation is not visualized. Aorta:  The aortic root, ascending aorta and aortic arch are all structurally normal, with no evidence of dilitation or obstruction. IAS/Shunts: No atrial level shunt detected by color flow Doppler.  LEFT VENTRICLE PLAX 2D LVIDd:         6.70 cm      Diastology LVIDs:         5.80 cm      LV e' lateral:   8.68 cm/s LV PW:         0.90 cm      LV E/e' lateral: 11.4 LV IVS:        1.40 cm LVOT diam:     2.10 cm LV SV:         67 LV SV Index:   36 LVOT Area:     3.46 cm  LV Volumes (MOD) LV vol d, MOD A2C: 204.0 ml LV vol d, MOD A4C: 167.0 ml LV vol s, MOD A2C: 141.0 ml LV vol s, MOD A4C: 125.0 ml LV SV MOD A2C:     63.0 ml LV SV MOD A4C:     167.0 ml LV SV MOD BP:      46.6 ml RIGHT VENTRICLE RV Basal diam:  4.15 cm RV Mid diam:    3.10 cm RV S prime:     7.05 cm/s TAPSE (M-mode): 1.5 cm LEFT ATRIUM              Index        RIGHT ATRIUM           Index LA diam:        5.60 cm  3.06 cm/m   RA Area:     33.30 cm LA Vol (A2C):   84.7 ml  46.32 ml/m  RA Volume:   132.00 ml 72.19 ml/m LA Vol (A4C):   107.0 ml 58.52 ml/m LA Biplane Vol: 96.9 ml  52.99 ml/m  AORTIC VALVE                    PULMONIC VALVE AV Area (Vmax):    2.10 cm     PV Vmax:       0.80 m/s AV Area (Vmean):   1.91 cm     PV Peak grad:  2.6 mmHg AV Area (VTI):     2.21  cm AV Vmax:           155.50 cm/s AV Vmean:          99.300 cm/s AV VTI:            0.301 m AV Peak Grad:      9.7 mmHg AV Mean Grad:      4.5 mmHg LVOT Vmax:         94.30 cm/s LVOT Vmean:        54.800 cm/s LVOT VTI:          0.192 m LVOT/AV VTI ratio: 0.64  AORTA Ao Root diam: 3.20 cm MITRAL VALVE               TRICUSPID VALVE MV Area (PHT): 6.17 cm    TR Peak grad:   57.8 mmHg MV Area VTI:   1.75 cm    TR Vmax:        380.00 cm/s MV Peak grad:  4.8 mmHg MV Mean grad:  1.0 mmHg    SHUNTS MV Vmax:       1.10 m/s    Systemic VTI:  0.19 m MV Vmean:      45.2 cm/s   Systemic Diam: 2.10 cm MV Decel Time: 123 msec MV E velocity: 99.10 cm/s MV A velocity: 35.50 cm/s MV E/A ratio:  2.79  Shaukat Khan Electronically signed by Adrian Blackwater Signature Date/Time: 05/17/2023/2:04:57 PM    Final      Medications:    albumin human      apixaban  5 mg Oral BID   Chlorhexidine Gluconate Cloth  6 each Topical Q0600   hydrOXYzine  50 mg Oral QHS   levothyroxine  75 mcg Oral Q0600   midodrine  10 mg Oral TID   mupirocin ointment  1 Application Nasal BID   pantoprazole  40 mg Oral Daily   patiromer  1 packet Oral Daily   ranolazine  500 mg Oral BID   rosuvastatin  20 mg Oral QHS   sevelamer carbonate  3,200 mg Oral TID WC   albuterol, butalbital-acetaminophen-caffeine, lidocaine-prilocaine, nitroGLYCERIN, ondansetron (ZOFRAN) IV, oxyCODONE-acetaminophen, pentafluoroprop-tetrafluoroeth  Assessment/ Plan:  [name] is a 62 y.o.  female with past medical histories including CAD with CABG and end-stage renal disease on dialysis. Patient presents to the emergency department with complaints of chest pain and low BP. She has been admitted for Bradycardia [R00.1] Elevated troponin [R79.89] NSTEMI (non-ST elevated myocardial infarction) (HCC) [I21.4] Chest pain, unspecified type [R07.9]  [facility] [place]/MWF/RT permcath  End-stage renal disease on hemodialysis.  No missed recent treatments. Will provide dialysis treatment today, as patient is 4.9kg over dry weight.   Patient received UF only treatment yesterday, 2.5L fluid removal. Receiving treatment today to maintain outpatient schedule. Patient has removed 5L of fluid during this admission. Patient cleared to discharge from renal stance.   2. Anemia of chronic kidney disease Lab Results  Component Value Date   HGB 11.0 (L) 05/19/2023    Hgb 11.0, within optimal range. Mircera received at outpatient clinic.   3. Secondary Hyperparathyroidism: with outpatient labs: PTH 417, phosphorus 5.5, calcium 9.4 on 04/26/23.   Lab Results  Component Value Date   PTH 627 (H) 07/26/2020   CALCIUM 9.6 05/19/2023   CAION  0.58 (LL) 04/22/2021   PHOS 4.2 05/18/2023    Prescribed calcitriol and sevelamer outpatient. Bone minerals within desired range.   4. Atypical chest pain, Troponin 75-68. No ST changes on EKG.  Cardiology consulted and feels hypotension and bradycardia produced chest pain from hypoperfusion.  Denies chest pain and shortness of breath   LOS: 2 Zaedyn Covin 12/4/202410:16 AM

## 2023-05-21 DIAGNOSIS — N2581 Secondary hyperparathyroidism of renal origin: Secondary | ICD-10-CM | POA: Diagnosis not present

## 2023-05-21 DIAGNOSIS — N186 End stage renal disease: Secondary | ICD-10-CM | POA: Diagnosis not present

## 2023-05-21 DIAGNOSIS — Z992 Dependence on renal dialysis: Secondary | ICD-10-CM | POA: Diagnosis not present

## 2023-05-22 IMAGING — CT CT HEAD W/O CM
4 series · 16 of 47 positions shown, 18 images · non-contrast
Comparison: None.

CLINICAL DATA: Trauma.

EXAM:
CT HEAD WITHOUT CONTRAST
CT CERVICAL SPINE WITHOUT CONTRAST
TECHNIQUE: Multidetector CT imaging of the head and cervical spine was
performed following the standard protocol without intravenous
contrast. Multiplanar CT image reconstructions of the cervical spine
were also generated.

[Series 2: head wo · axial · 0.43mm/px · z∈[-106,+14]mm · 7 of 33 slices shown, 9 images]
[im 5/33  brain]
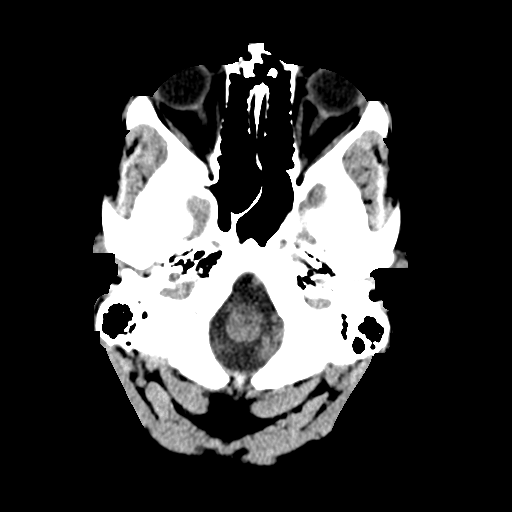
[im 5/33  bone]
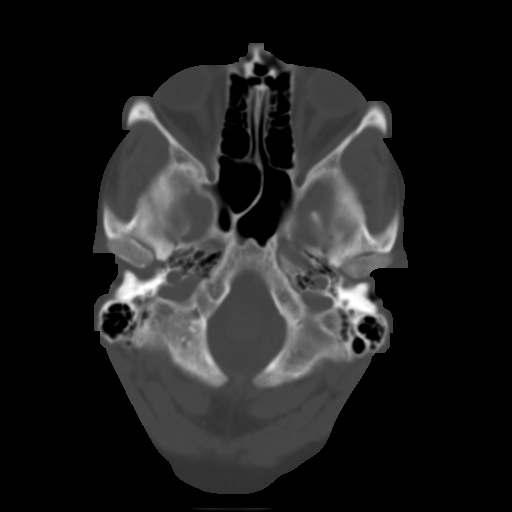
[im 9/33  brain]
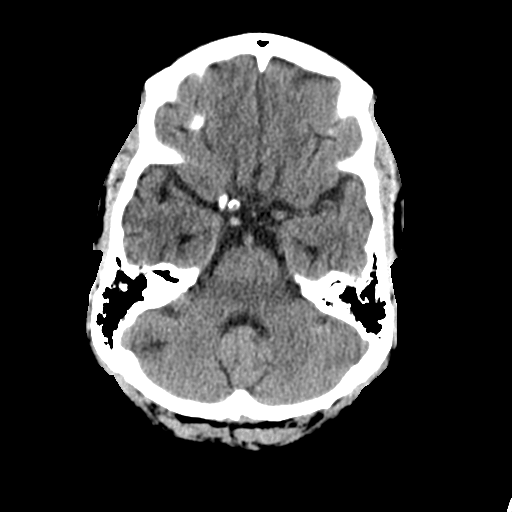
[im 13/33  brain]
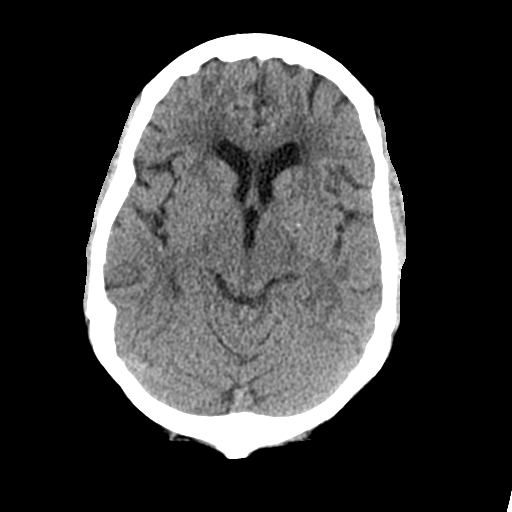
[im 17/33  brain]
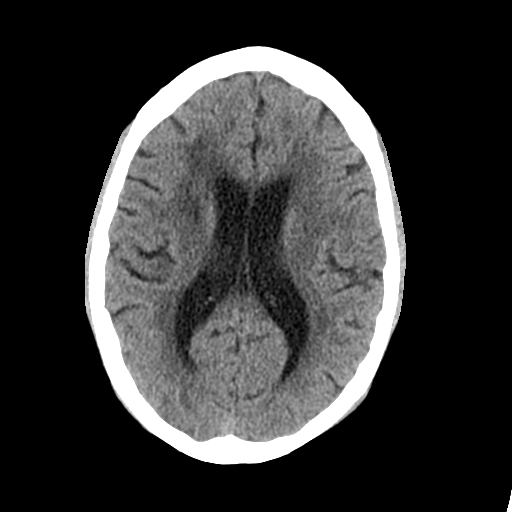
[im 21/33  brain]
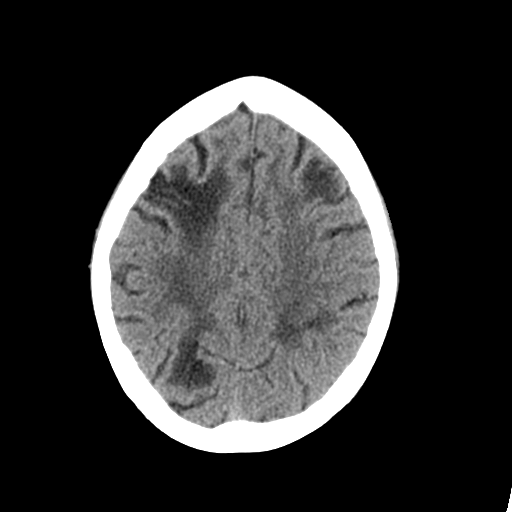
[im 21/33  bone]
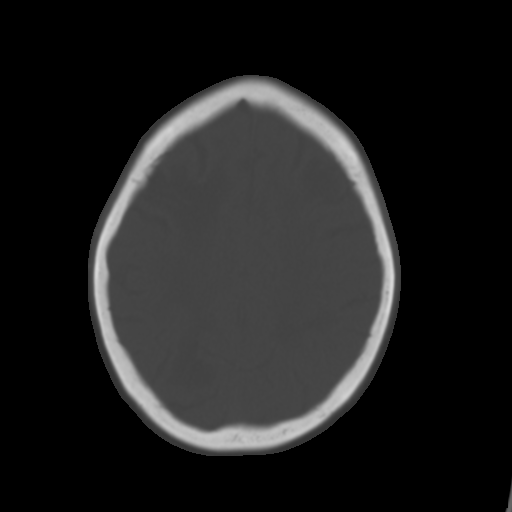
[im 25/33  brain]
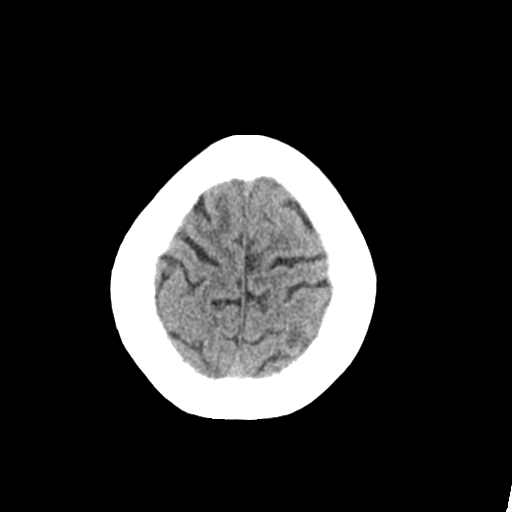
[im 29/33  brain]
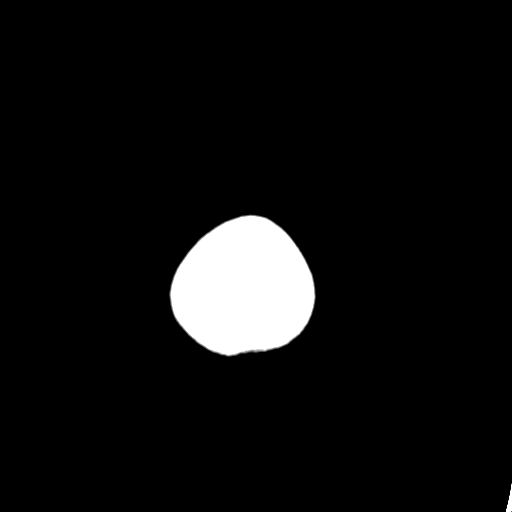

[Series 3: head bone · axial · 0.43mm/px · z∈[-110,-78]mm · 3 of 81 slices shown]
[im 9/81  bone]
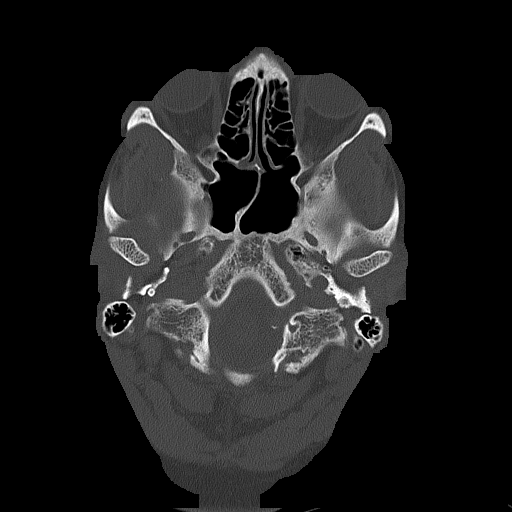
[im 17/81  bone]
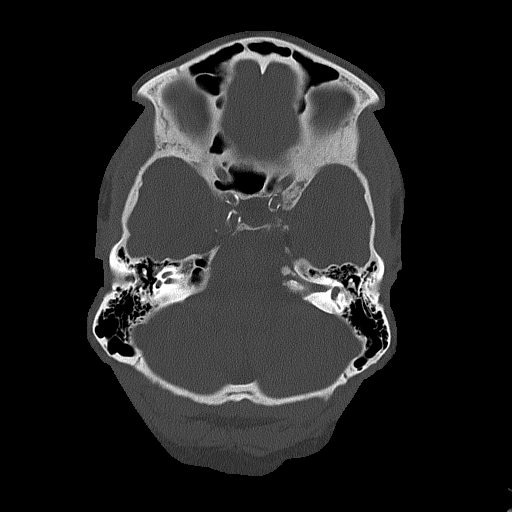
[im 25/81  bone]
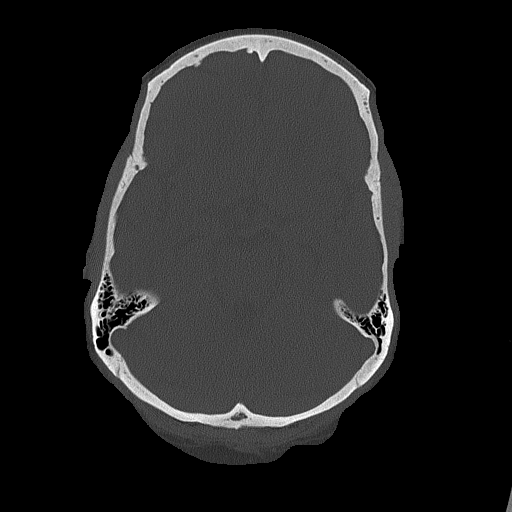

[Series 4: coronal soft tissue · coronal · 0.34mm/px · 3 of 71 slices shown]
[im 24/71  brain]
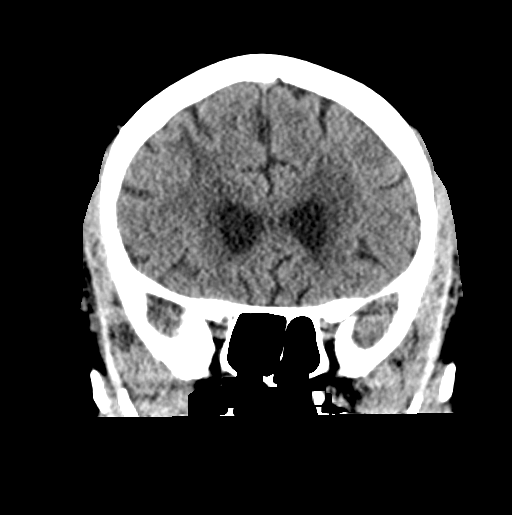
[im 32/71  brain]
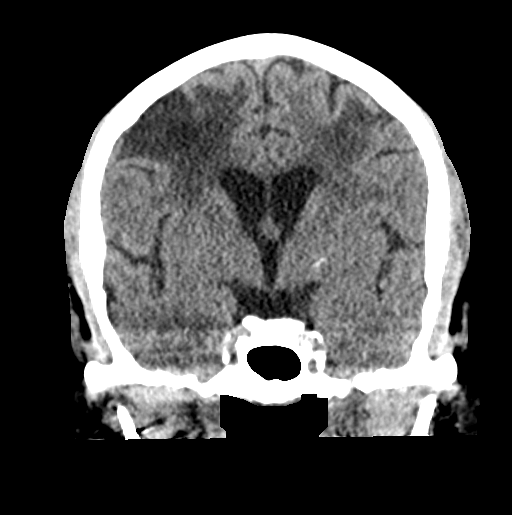
[im 39/71  brain]
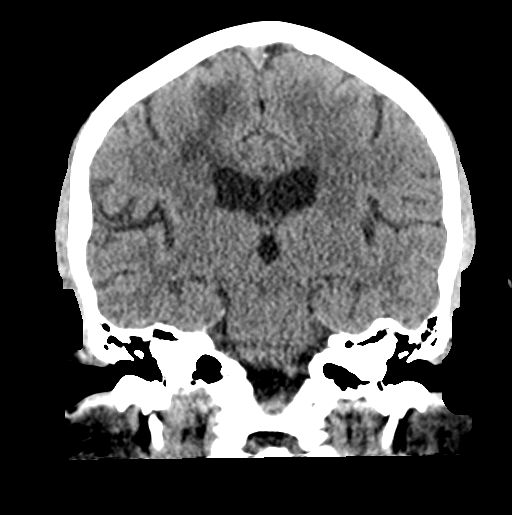

[Series 5: sagittal soft tissue · sagittal · 0.35mm/px · 3 of 54 slices shown]
[im 18/54  brain]
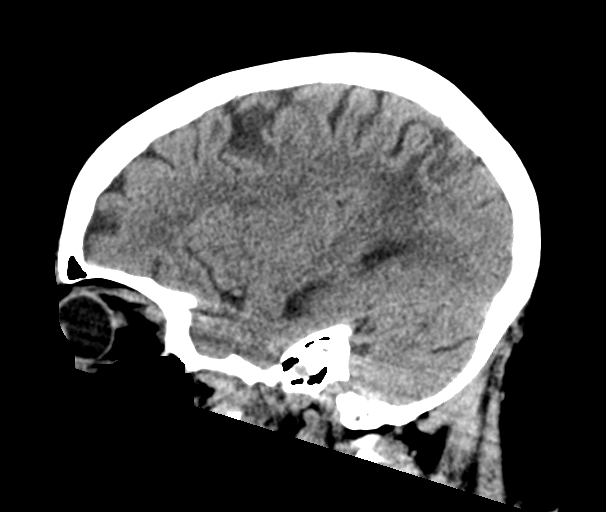
[im 27/54  brain]
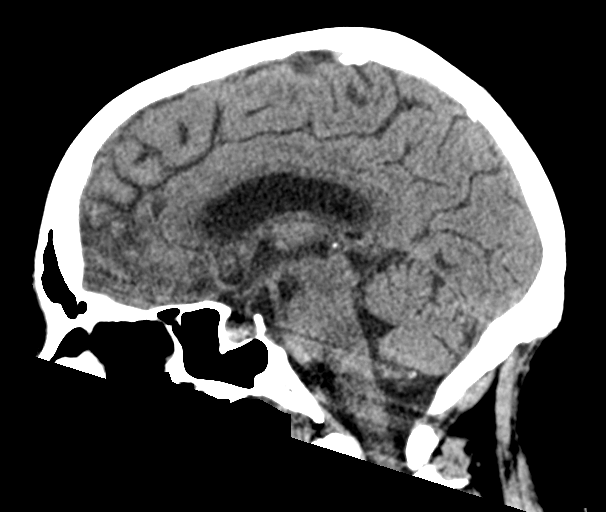
[im 36/54  brain]
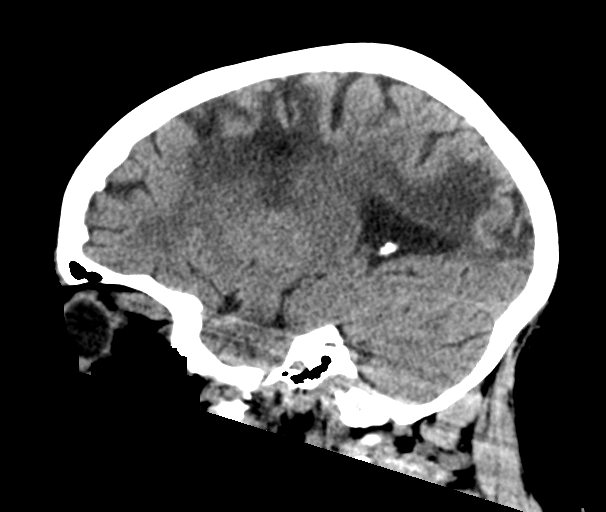

[16 of 47 positions shown; findings below may reference images not displayed]

FINDINGS: CT HEAD FINDINGS

Brain: Mild age-related atrophy and moderate microvascular ischemic
changes. Bilateral with hypodensities may represent ischemic changes
versus white-matter disease. There is no acute intracranial
hemorrhage. No effect or midline shift. No extra-axial fluid
collection.

Vascular: No hyperdense vessel or unexpected calcification.

Skull: Normal. Negative for fracture or focal lesion.

Sinuses/Orbits: No acute finding.

Other: None

CT CERVICAL SPINE FINDINGS

Alignment: No acute subluxation. There is reversal normal cervical
lordosis which may be positional or due to muscle spasm or secondary
to degenerative changes.

Skull base and vertebrae: No acute fracture.

Soft tissues and spinal canal: No prevertebral fluid or swelling. No
visible canal hematoma.

Disc levels: Multilevel degenerative changes with disc space
narrowing and endplate irregularity.

Upper chest: Negative.

Other: Bilateral carotid plaques.
IMPRESSION: 1. No acute intracranial pathology.
2. Age-related atrophy and microvascular ischemic changes. Bilateral
periventricular hypodensities may represent ischemic changes versus
white-matter disease.
3. No acute/traumatic cervical spine pathology. Multilevel
degenerative changes.

## 2023-05-22 IMAGING — CT CT CERVICAL SPINE W/O CM
3 of 4 series · 12 of 35 positions shown, 14 images · non-contrast
Comparison: None.

CLINICAL DATA: Trauma.

EXAM:
CT HEAD WITHOUT CONTRAST
CT CERVICAL SPINE WITHOUT CONTRAST
TECHNIQUE: Multidetector CT imaging of the head and cervical spine was
performed following the standard protocol without intravenous
contrast. Multiplanar CT image reconstructions of the cervical spine
were also generated.

[Series 6: orthogonal bone · axial · 0.27mm/px · z∈[-255,-138]mm · 4 of 95 slices shown, 5 images]
[im 14/95  soft-tissue]
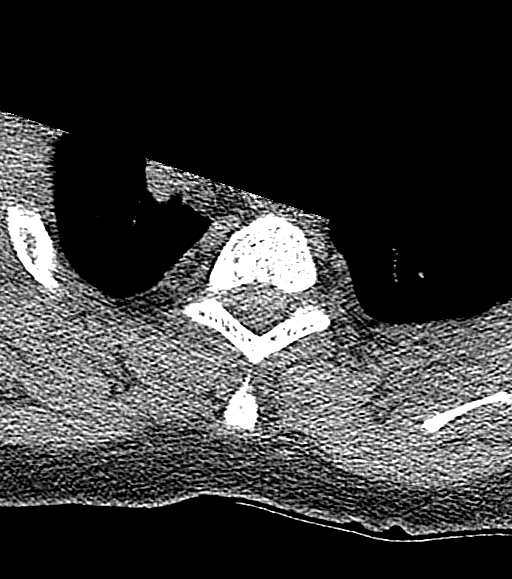
[im 14/95  bone]
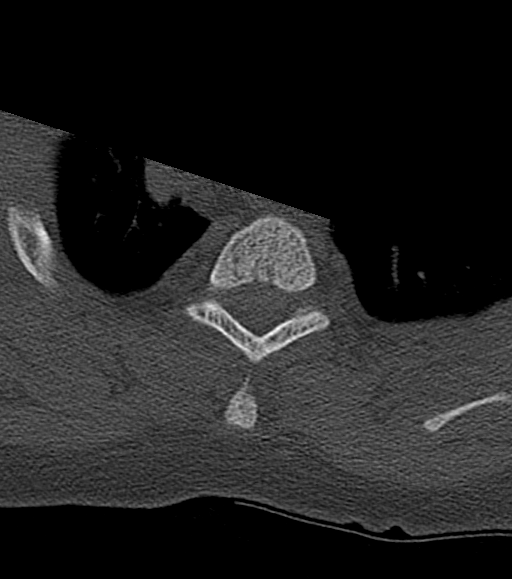
[im 41/95  bone]
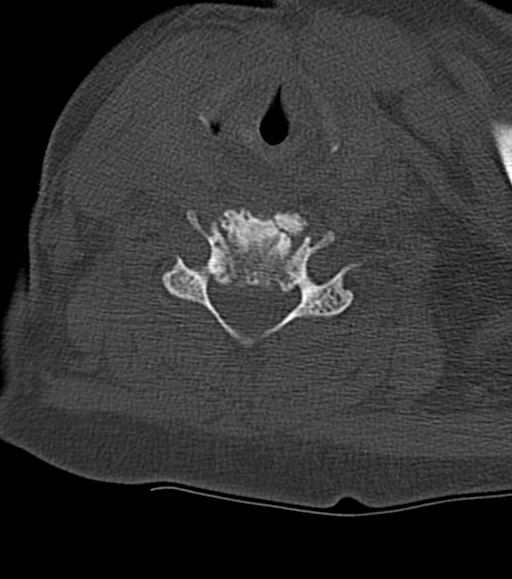
[im 54/95  bone]
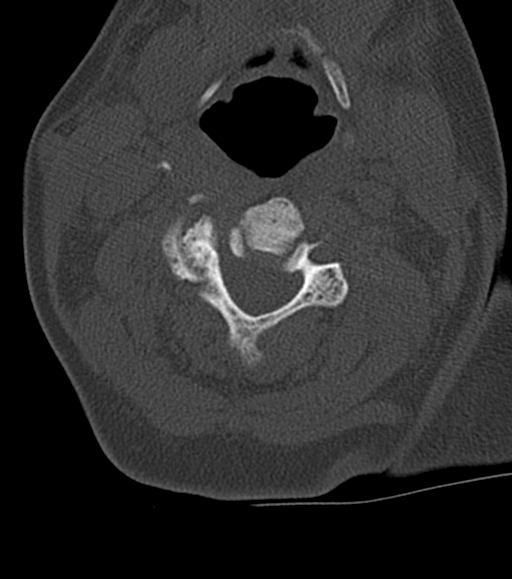
[im 81/95  bone]
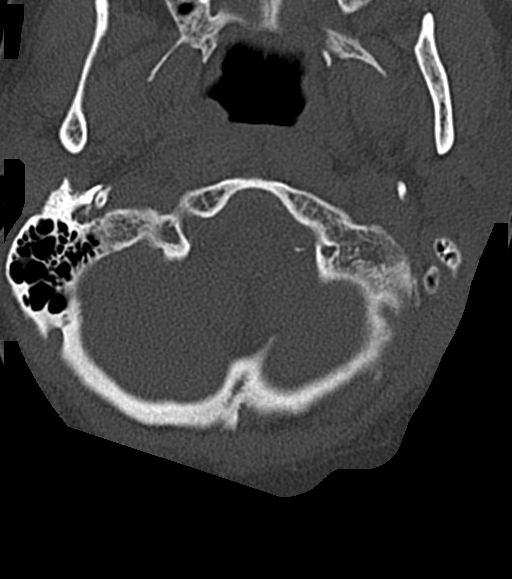

[Series 7: sagittal bone · sagittal · 0.33mm/px · 5 of 69 slices shown, 6 images]
[im 23/69  bone]
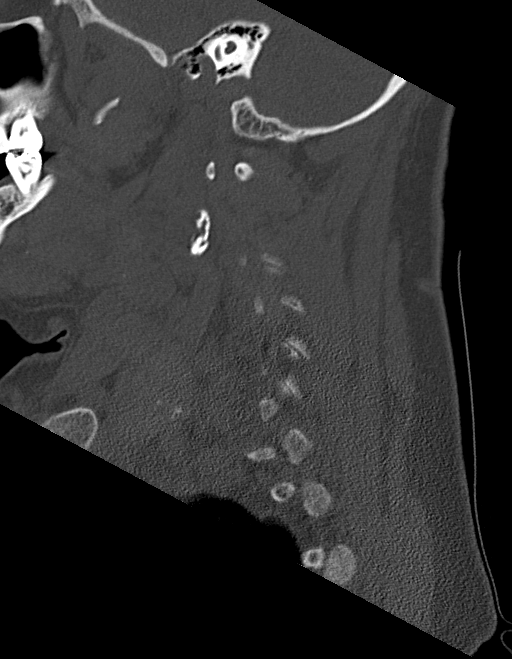
[im 29/69  bone]
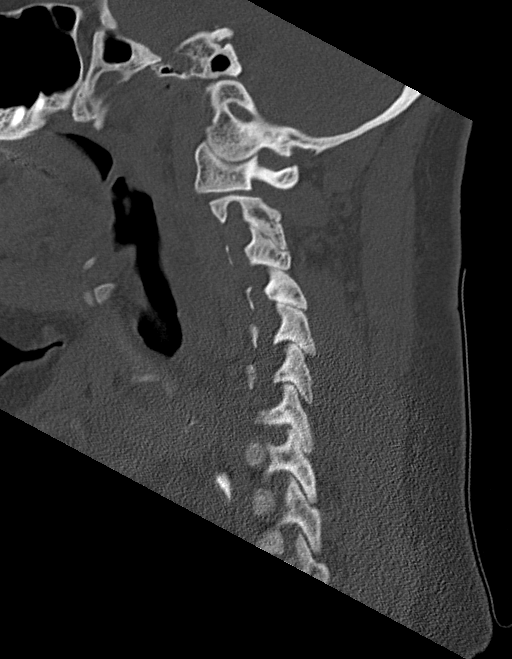
[im 35/69  soft-tissue]
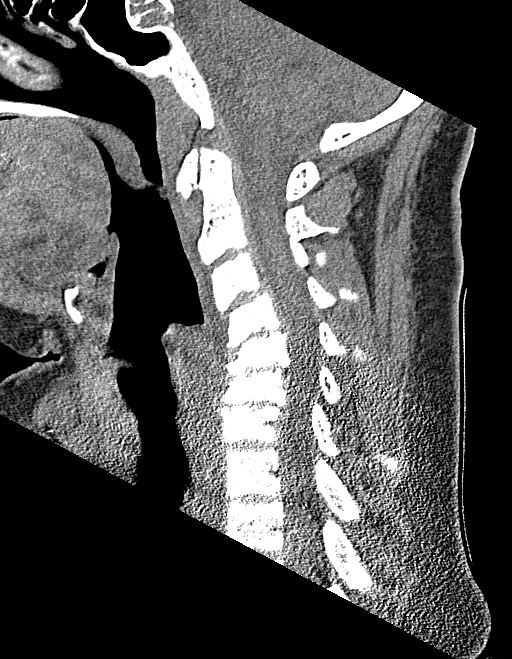
[im 35/69  bone]
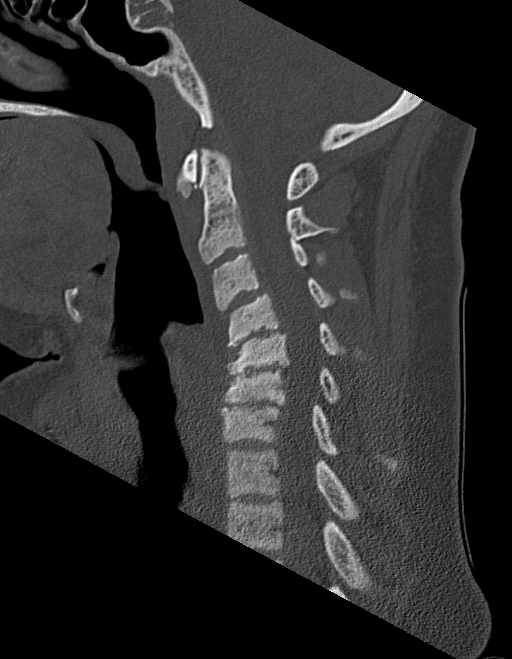
[im 40/69  bone]
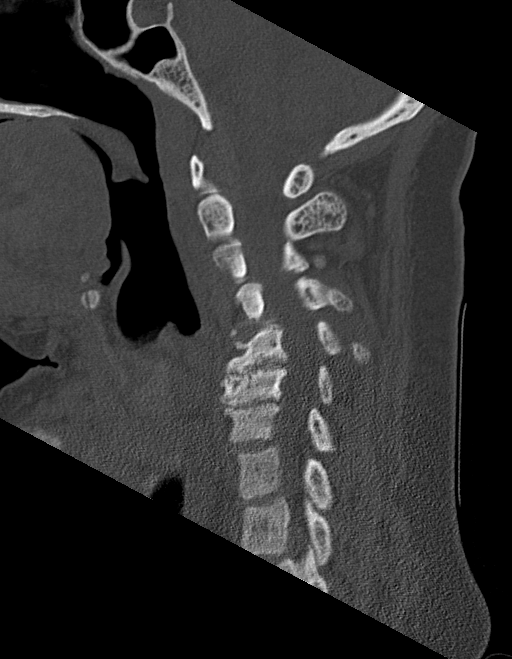
[im 46/69  bone]
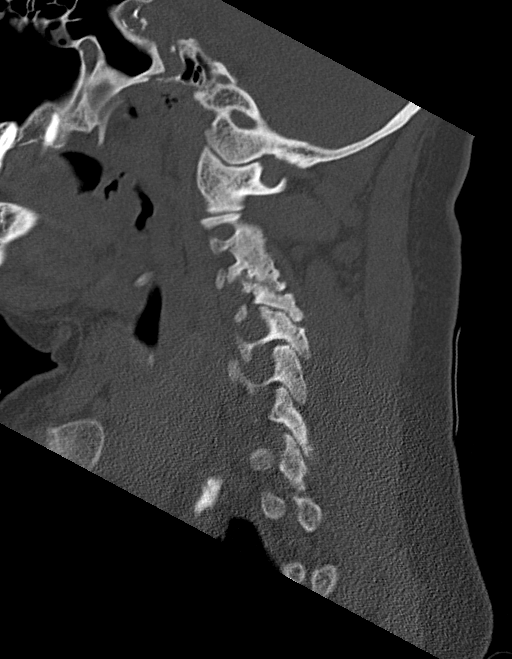

[Series 8: coronal bone · coronal · 0.25mm/px · 3 of 76 slices shown]
[im 22/76  bone]
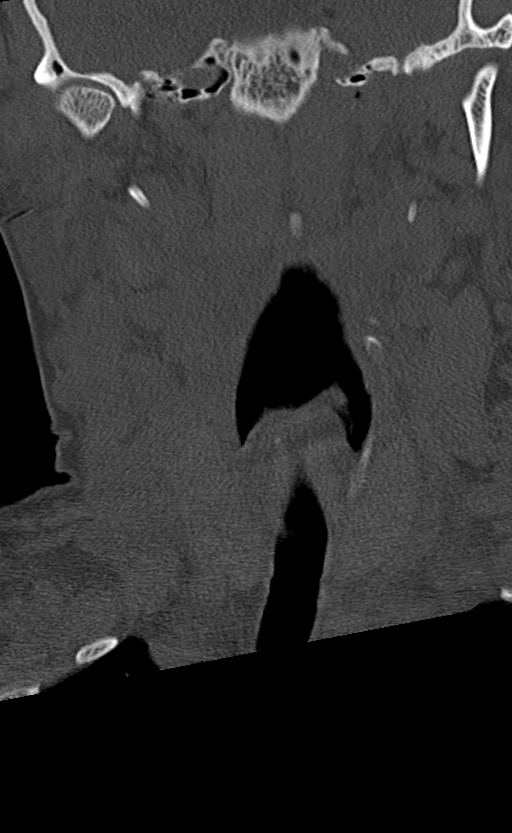
[im 33/76  bone]
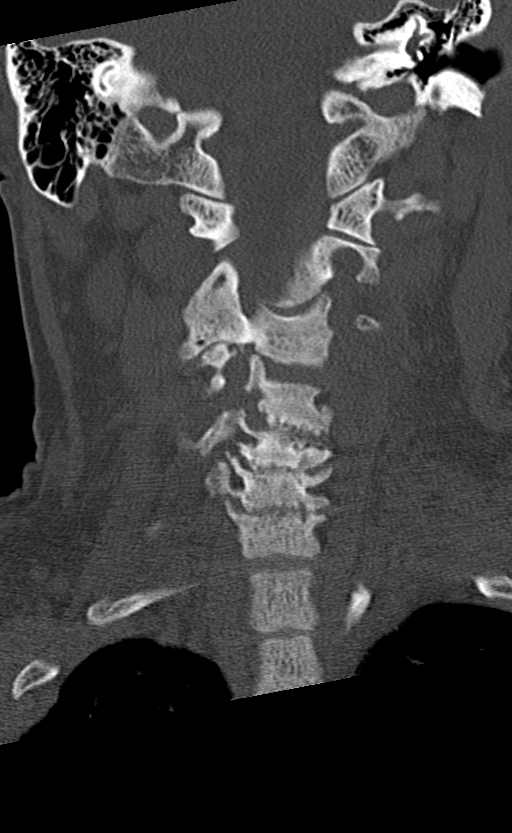
[im 44/76  bone]
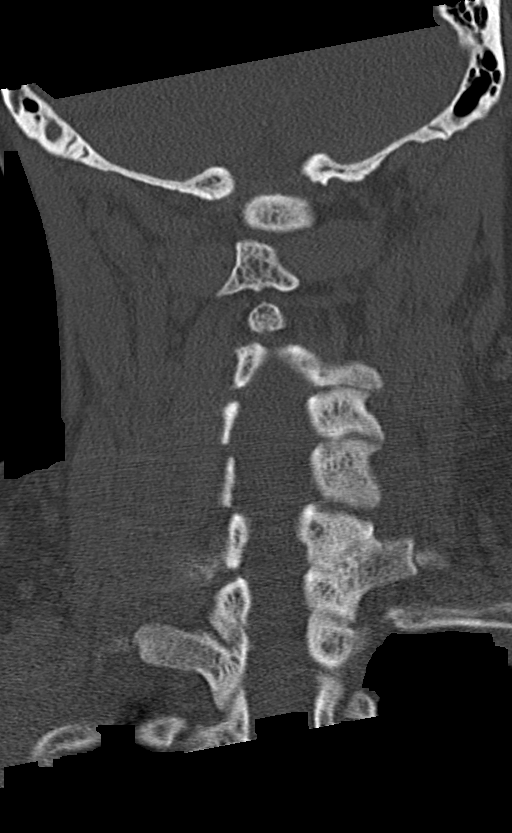

[12 of 35 positions shown; findings below may reference images not displayed]

FINDINGS: CT HEAD FINDINGS

Brain: Mild age-related atrophy and moderate microvascular ischemic
changes. Bilateral with hypodensities may represent ischemic changes
versus white-matter disease. There is no acute intracranial
hemorrhage. No effect or midline shift. No extra-axial fluid
collection.

Vascular: No hyperdense vessel or unexpected calcification.

Skull: Normal. Negative for fracture or focal lesion.

Sinuses/Orbits: No acute finding.

Other: None

CT CERVICAL SPINE FINDINGS

Alignment: No acute subluxation. There is reversal normal cervical
lordosis which may be positional or due to muscle spasm or secondary
to degenerative changes.

Skull base and vertebrae: No acute fracture.

Soft tissues and spinal canal: No prevertebral fluid or swelling. No
visible canal hematoma.

Disc levels: Multilevel degenerative changes with disc space
narrowing and endplate irregularity.

Upper chest: Negative.

Other: Bilateral carotid plaques.
IMPRESSION: 1. No acute intracranial pathology.
2. Age-related atrophy and microvascular ischemic changes. Bilateral
periventricular hypodensities may represent ischemic changes versus
white-matter disease.
3. No acute/traumatic cervical spine pathology. Multilevel
degenerative changes.

## 2023-05-24 DIAGNOSIS — N2581 Secondary hyperparathyroidism of renal origin: Secondary | ICD-10-CM | POA: Diagnosis not present

## 2023-05-24 DIAGNOSIS — Z992 Dependence on renal dialysis: Secondary | ICD-10-CM | POA: Diagnosis not present

## 2023-05-24 DIAGNOSIS — N186 End stage renal disease: Secondary | ICD-10-CM | POA: Diagnosis not present

## 2023-05-25 ENCOUNTER — Encounter (INDEPENDENT_AMBULATORY_CARE_PROVIDER_SITE_OTHER): Payer: Self-pay | Admitting: Vascular Surgery

## 2023-05-25 ENCOUNTER — Ambulatory Visit (INDEPENDENT_AMBULATORY_CARE_PROVIDER_SITE_OTHER): Payer: 59 | Admitting: Vascular Surgery

## 2023-05-25 VITALS — BP 98/65 | HR 53 | Resp 16

## 2023-05-25 DIAGNOSIS — I5042 Chronic combined systolic (congestive) and diastolic (congestive) heart failure: Secondary | ICD-10-CM

## 2023-05-25 DIAGNOSIS — I10 Essential (primary) hypertension: Secondary | ICD-10-CM

## 2023-05-25 DIAGNOSIS — N186 End stage renal disease: Secondary | ICD-10-CM | POA: Diagnosis not present

## 2023-05-25 DIAGNOSIS — Z992 Dependence on renal dialysis: Secondary | ICD-10-CM | POA: Diagnosis not present

## 2023-05-25 NOTE — Assessment & Plan Note (Signed)
Her left arm is healing well and I think we can use that again in 2 to 3 weeks.  We discussed just starting using this access again at the first of the year.  We will use her PermCath until that time.  Once her fistula is being used again, we can take her PermCath out.  I will plan on reassessing this fistula with a duplex in about 3 months.

## 2023-05-25 NOTE — Progress Notes (Signed)
MRN : [id number]  [name] is a 62 y.o. (09-18-1960) female who presents with chief complaint of  Chief Complaint  Patient presents with   Follow-up    1 month follow up  .  History of Present Illness: Patient returns today in follow up of her dialysis access.  Her left arm is healing nicely.  They have been using her PermCath for the last few weeks, and the redness, swelling, and tenderness on the left arm is much improved.  She has access options on the right arm but not in the form of the cephalic vein.  The left arm is still patent and as the skin has healed, we have discussed potentially using this again going forward in the near future.  Current Outpatient Medications  Medication Sig Dispense Refill   albuterol (VENTOLIN HFA) 108 (90 Base) MCG/ACT inhaler Inhale 1-2 puffs into the lungs every 6 (six) hours as needed for wheezing or shortness of breath. 20.1 g 3   apixaban (ELIQUIS) 5 MG TABS tablet Take 5 mg by mouth 2 (two) times daily.     Bismuth Tribromoph-Petrolatum (XEROFORM PETROLAT PATCH 2"X2") PADS Apply 1 each topically every 3 (three) days. 25 each 0   hydrOXYzine (ATARAX/VISTARIL) 50 MG tablet Take 50 mg by mouth at bedtime.     levothyroxine (SYNTHROID) 75 MCG tablet Take 75 mcg by mouth daily before breakfast.      midodrine (PROAMATINE) 10 MG tablet Take 1 tablet (10 mg total) by mouth 3 (three) times daily. Monday, Wednesday, Friday with hemodialysis 90 tablet 0   omeprazole (PRILOSEC) 20 MG capsule Take 20 mg by mouth daily.     oxyCODONE-acetaminophen (PERCOCET/ROXICET) 5-325 MG tablet Take 1 tablet by mouth every 6 (six) hours as needed for severe pain (pain score 7-10). 20 tablet 0   ranolazine (RANEXA) 1000 MG SR tablet Take 1,000 mg by mouth 2 (two) times daily.     rosuvastatin (CRESTOR) 20 MG tablet Take 20 mg by mouth at bedtime.     sevelamer carbonate (RENVELA) 800 MG tablet Take 3,200 mg by mouth 3 (three) times daily with meals.     VELTASSA 8.4  g packet Take 1 packet by mouth daily.     No current facility-administered medications for this visit.   Facility-Administered Medications Ordered in Other Visits  Medication Dose Route Frequency Provider Last Rate Last Admin   heparin injection    PRN Annice Needy, MD   10,000 Units at 04/08/23 1412    Past Medical History:  Diagnosis Date   Adult behavior problems    frontal lobe CVA   Anemia    chronic disease   Atrial flutter by electrocardiogram (HCC) 03/22/2014   Cardiomyopathy (HCC)    CHF (congestive heart failure) (HCC)    Chicken pox    Chronic respiratory failure with hypoxia (HCC) 12/19/2021   Coronary artery disease    Delayed surgical wound healing 04/12/2014   Overview:  Left leg   GERD (gastroesophageal reflux disease)    H/O atrial flutter 04/12/2014   H/O stroke without residual deficits 02/13/2014   H/O: substance abuse (HCC) 02/27/2014   Heart failure (HCC)    Hepatitis    history of hep c   History of CVA (cerebrovascular accident) 02/21/2014   History of delirium 04/12/2014   Hyperkalemia 11/13/2014   Hyperlipidemia    Hypertension    Hypotension 02/24/2014   Leukocytosis 02/26/2014   Myocardial infarction (HCC)    Obesity  Paroxysmal atrial fibrillation (HCC)    Peripheral vascular disease (HCC)    Postoperative anemia due to acute blood loss 02/27/2014   Renal failure    Renal insufficiency    Stroke [facility]) 2011   Superficial incisional surgical site infection 03/14/2014   Syncope and collapse 07/25/2020    Past Surgical History:  Procedure Laterality Date   A/V FISTULAGRAM Left 03/30/2019   Procedure: A/V FISTULAGRAM;  Surgeon: Annice Needy, MD;  Location: [facility] INVASIVE CV LAB;  Service: Cardiovascular;  Laterality: Left;   A/V FISTULAGRAM Left 10/06/2019   Procedure: A/V FISTULAGRAM;  Surgeon: Annice Needy, MD;  Location: [facility] CV LAB;  Service: Cardiovascular;  Laterality: Left;   A/V FISTULAGRAM Left 05/19/2021   Procedure:  A/V FISTULAGRAM;  Surgeon: Annice Needy, MD;  Location: [facility] CV LAB;  Service: Cardiovascular;  Laterality: Left;   A/V FISTULAGRAM Left 01/29/2022   Procedure: A/V Fistulagram;  Surgeon: Annice Needy, MD;  Location: [facility] CV [facility];  Service: Cardiovascular;  Laterality: Left;   A/V FISTULAGRAM Left 04/30/2022   Procedure: A/V Fistulagram;  Surgeon: Annice Needy, MD;  Location: [facility];  Service: Cardiovascular;  Laterality: Left;   A/V FISTULAGRAM Left 08/20/2022   Procedure: A/V Fistulagram;  Surgeon: Annice Needy, MD;  Location: [facility];  Service: Cardiovascular;  Laterality: Left;   A/V FISTULAGRAM N/A 12/03/2022   Procedure: A/V Fistulagram;  Surgeon: Annice Needy, MD;  Location: [facility];  Service: Cardiovascular;  Laterality: N/A;   A/V FISTULAGRAM Left 02/11/2023   Procedure: A/V Fistulagram;  Surgeon: Annice Needy, MD;  Location: [facility];  Service: Cardiovascular;  Laterality: Left;   COLONOSCOPY     COLONOSCOPY WITH PROPOFOL N/A 04/22/2021   Procedure: COLONOSCOPY WITH PROPOFOL;  Surgeon: Regis Bill, MD;  Location: [facility] ENDOSCOPY;  Service: Endoscopy;  Laterality: N/A;   CORONARY ANGIOPLASTY WITH STENT PLACEMENT  2013   CORONARY ARTERY BYPASS GRAFT     DIALYSIS FISTULA CREATION     DIALYSIS/PERMA CATHETER INSERTION N/A 04/08/2023   Procedure: DIALYSIS/PERMA CATHETER INSERTION;  Surgeon: Annice Needy, MD;  Location: [facility];  Service: Cardiovascular;  Laterality: N/A;   ESOPHAGOGASTRODUODENOSCOPY     ESOPHAGOGASTRODUODENOSCOPY N/A 04/22/2021   Procedure: ESOPHAGOGASTRODUODENOSCOPY (EGD);  Surgeon: Regis Bill, MD;  Location: [facility] ENDOSCOPY;  Service: Endoscopy;  Laterality: N/A;   FLEXIBLE SIGMOIDOSCOPY N/A 02/23/2019   Procedure: FLEXIBLE SIGMOIDOSCOPY;  Surgeon: Christena Deem, MD;  Location: [facility] ENDOSCOPY;  Service: Endoscopy;  Laterality: N/A;   LEFT HEART CATH AND CORS/GRAFTS  ANGIOGRAPHY N/A 08/08/2018   Procedure: LEFT HEART CATH AND CORS/GRAFTS ANGIOGRAPHY;  Surgeon: Laurier Nancy, MD;  Location: [facility];  Service: Cardiovascular;  Laterality: N/A;   LEFT HEART CATH AND CORS/GRAFTS ANGIOGRAPHY N/A 01/30/2019   Procedure: LEFT HEART CATH AND CORS/GRAFTS ANGIOGRAPHY;  Surgeon: Laurier Nancy, MD;  Location: [facility] CV LAB;  Service: Cardiovascular;  Laterality: N/A;   percutaneous insertion intra aortic balloon right cath     ultrasound guided pericardiocentesis       Social History   Tobacco Use   Smoking status: Some Days    Current packs/day: 0.25    Average packs/day: 0.3 packs/day for 20.0 years (5.0 ttl pk-yrs)    Types: Cigarettes   Smokeless tobacco: Never   Tobacco comments:    smokes two cigarettes a day  Vaping Use   Vaping status: Never Used  Substance Use Topics   Alcohol use: No   Drug use: No      Family History  Problem Relation Age of Onset   Hypertension Mother    Ovarian cancer Mother    Diabetes type II Mother    Hypertension Father    Kidney disease Father    Hypertension Sister    Diabetes type II Maternal Grandmother    Breast cancer Maternal Grandmother    Breast cancer Maternal Aunt    Hypertension Other    Cancer Other    Renal Disease Other      Allergies  Allergen Reactions   Morphine And Codeine Hives   Levaquin [Levofloxacin] Itching    Severe itching; prickly sensation   Enalapril Other (See Comments)    hallucinations   Latex Rash   Tape Rash and Other (See Comments)     REVIEW OF SYSTEMS (Negative unless checked)   Constitutional: [] Weight loss  [] Fever  [] Chills Cardiac: [x] Chest pain   [] Chest pressure   [x] Palpitations   [] Shortness of breath when laying flat   [] Shortness of breath at rest   [x] Shortness of breath with exertion. Vascular:  [] Pain in legs with walking   [] Pain in legs at rest   [] Pain in legs when laying flat   [] Claudication   [] Pain in feet when  walking  [] Pain in feet at rest  [] Pain in feet when laying flat   [] History of DVT   [] Phlebitis   [] Swelling in legs   [] Varicose veins   [] Non-healing ulcers Pulmonary:   [] Uses home oxygen   [] Productive cough   [] Hemoptysis   [] Wheeze  [] COPD   [] Asthma Neurologic:  [] Dizziness  [] Blackouts   [] Seizures   [x] History of stroke   [] History of TIA  [] Aphasia   [] Temporary blindness   [] Dysphagia   [] Weakness or numbness in arms   [] Weakness or numbness in legs Musculoskeletal:  [] Arthritis   [] Joint swelling   [] Joint pain   [] Low back pain Hematologic:  [] Easy bruising  [] Easy bleeding   [] Hypercoagulable state   [x] Anemic   Gastrointestinal:  [] Blood in stool   [] Vomiting blood  [] Gastroesophageal reflux/heartburn   [] Abdominal pain Genitourinary:  [x] Chronic kidney disease   [] Difficult urination  [] Frequent urination  [] Burning with urination   [] Hematuria Skin:  [] Rashes   [] Ulcers   [] Wounds Psychological:  [] History of anxiety   []  History of major depression.  Physical Examination  BP 98/65 (BP Location: Right Arm)   Pulse (!) 53   Resp 16  Gen:  WD/WN, NAD Head: [place]/AT, No temporalis wasting. Ear/Nose/Throat: Hearing grossly intact, nares w/o erythema or drainage Eyes: Conjunctiva clear. Sclera non-icteric Neck: Supple.  Trachea midline Pulmonary:  Good air movement, no use of accessory muscles.  Cardiac: Bradycardic Vascular: Right jugular PermCath without erythema or drainage.  Left arm AV fistula and jump graft are patent with good thrill.  Erythema has resolved.  The scabs are healed over at this point largely. Vessel Right Left  Radial Palpable Palpable               Musculoskeletal: M/S 5/5 throughout.  No deformity or atrophy.  Trace lower extremity edema. Neurologic: Sensation grossly intact in extremities.  Symmetrical.  Speech is fluent.  Psychiatric: Judgment intact, Mood & affect appropriate for pt's clinical situation. Dermatologic: No rashes or ulcers noted.   No cellulitis or open wounds.      Labs Recent Results (from the past 2160 hour(s))  Aerobic culture  Status: None   Collection Time: 03/25/23 10:27 AM  Result Value Ref Range   Aerobic Bacterial Culture Final report    Organism ID, Bacteria Mixed skin flora     Comment: Scant growth  Potassium [facility] vascular lab only)     Status: Abnormal   Collection Time: 04/08/23  1:02 PM  Result Value Ref Range   Potassium [facility] vascular lab) 5.3 (H) 3.5 - 5.1 mmol/L    Comment: Performed at [facility], [address] [place], [place] [address]  CBC with Differential     Status: Abnormal   Collection Time: 05/17/23  7:47 AM  Result Value Ref Range   WBC 4.9 4.0 - 10.5 K/uL   RBC 3.72 (L) 3.87 - 5.11 MIL/uL   Hemoglobin 11.7 (L) 12.0 - 15.0 g/dL   HCT 65.7 84.6 - 96.2 %   MCV 99.7 80.0 - 100.0 fL   MCH 31.5 26.0 - 34.0 pg   MCHC 31.5 30.0 - 36.0 g/dL   RDW 95.2 84.1 - 32.4 %   Platelets 113 (L) 150 - 400 K/uL   nRBC 0.0 0.0 - 0.2 %   Neutrophils Relative % 63 %   Neutro Abs 3.1 1.7 - 7.7 K/uL   Lymphocytes Relative 22 %   Lymphs Abs 1.1 0.7 - 4.0 K/uL   Monocytes Relative 11 %   Monocytes Absolute 0.5 0.1 - 1.0 K/uL   Eosinophils Relative 3 %   Eosinophils Absolute 0.1 0.0 - 0.5 K/uL   Basophils Relative 0 %   Basophils Absolute 0.0 0.0 - 0.1 K/uL   Immature Granulocytes 1 %   Abs Immature Granulocytes 0.03 0.00 - 0.07 K/uL    Comment: Performed at [facility], [address] [place], [place] [address]  Comprehensive metabolic panel     Status: Abnormal   Collection Time: 05/17/23  7:47 AM  Result Value Ref Range   Sodium 137 135 - 145 mmol/L   Potassium 5.0 3.5 - 5.1 mmol/L   Chloride 100 98 - 111 mmol/L   CO2 22 22 - 32 mmol/L   Glucose, Bld 102 (H) 70 - 99 mg/dL    Comment: Glucose reference range applies only to samples taken after fasting for at least 8 hours.   BUN 64 (H) 6 - 20 mg/dL   Creatinine, Ser 7.25 (H) 0.44 - 1.00 mg/dL   Calcium  9.6 8.9 - 36.6 mg/dL   Total Protein 7.7 6.5 - 8.1 g/dL   Albumin 3.6 3.5 - 5.0 g/dL   AST 24 15 - 41 U/L   ALT 16 0 - 44 U/L   Alkaline Phosphatase 105 38 - 126 U/L   Total Bilirubin 0.8 <1.2 mg/dL   GFR, Estimated 5 (L) >60 mL/min    Comment: (NOTE) Calculated using the CKD-EPI Creatinine Equation (2021)    Anion gap 15 5 - 15    Comment: Performed at [facility], [address] [place], [place] [address]  Troponin I (High Sensitivity)     Status: Abnormal   Collection Time: 05/17/23  7:47 AM  Result Value Ref Range   Troponin I (High Sensitivity) 75 (H) <18 ng/L    Comment: (NOTE) Elevated high sensitivity troponin I (hsTnI) values and significant  changes across serial measurements may suggest ACS but many other  chronic and acute conditions are known to elevate hsTnI results.  Refer to the "Links" section for chest pain algorithms and additional  guidance. Performed at [facility], [phone]  [address] [place], [place] [address]   Heparin level (unfractionated)     Status: Abnormal   Collection Time: 05/17/23  7:47 AM  Result Value Ref Range   Heparin Unfractionated >1.10 (H) 0.30 - 0.70 IU/mL    Comment: (NOTE) The clinical reportable range upper limit is being lowered to >1.10 to align with the FDA approved guidance for the current laboratory assay.  If heparin results are below expected values, and patient dosage has  been confirmed, suggest follow up testing of antithrombin III levels. Performed at [facility], [address] [place], [place] [address]   APTT     Status: None   Collection Time: 05/17/23  7:47 AM  Result Value Ref Range   aPTT 30 24 - 36 seconds    Comment: Performed at [facility], [address] [place], [place] [address]  Protime-INR     Status: Abnormal   Collection Time: 05/17/23  7:47 AM  Result Value Ref Range   Prothrombin Time 18.9 (H) 11.4 - 15.2 seconds   INR 1.6 (H) 0.8 - 1.2    Comment:  (NOTE) INR goal varies based on device and disease states. Performed at [facility], [address] [place], [place] [address]   Lactic acid, plasma     Status: None   Collection Time: 05/17/23  8:54 AM  Result Value Ref Range   Lactic Acid, Venous 0.9 0.5 - 1.9 mmol/L    Comment: Performed at [facility], [address] [place], [place] [address]  Troponin I (High Sensitivity)     Status: Abnormal   Collection Time: 05/17/23 10:48 AM  Result Value Ref Range   Troponin I (High Sensitivity) 68 (H) <18 ng/L    Comment: (NOTE) Elevated high sensitivity troponin I (hsTnI) values and significant  changes across serial measurements may suggest ACS but many other  chronic and acute conditions are known to elevate hsTnI results.  Refer to the "Links" section for chest pain algorithms and additional  guidance. Performed at [facility], [address] [place], [place] [address]   ECHOCARDIOGRAM COMPLETE     Status: None   Collection Time: 05/17/23  1:58 PM  Result Value Ref Range   Weight 2,592 oz   Height 66 in   BP 103/76 mmHg   Ao pk vel 1.56 m/s   AV Area VTI 2.21 cm2   AR max vel 2.10 cm2   AV Mean grad 4.5 mmHg   AV Peak grad 9.7 mmHg   Single Plane A2C EF 30.9 %   Single Plane A4C EF 25.1 %   Calc EF 24.5 %   S' Lateral 5.80 cm   AV Area mean vel 1.91 cm2   Area-P 1/2 6.17 cm2   MV VTI 1.75 cm2   Est EF 25 - 30%   Hepatitis B surface antigen     Status: None   Collection Time: 05/17/23  2:50 PM  Result Value Ref Range   Hepatitis B Surface Ag NON REACTIVE NON REACTIVE    Comment: Performed at [facility], [address], [place], [place] [address]  Hepatitis B surface antibody,quantitative     Status: Abnormal   Collection Time: 05/17/23  2:50 PM  Result Value Ref Range   Hep B S AB Quant (Post) <3.5 (L) Immunity>10 mIU/mL    Comment: (NOTE)  Status of Immunity  Anti-HBs Level  ------------------                      -------------- Inconsistent with Immunity                  0.0 - 10.0 Consistent with Immunity                         >10.0 Performed At: [facility] [address] [place], [place] [id number] Jolene Schimke MD NW:[id number]   APTT     Status: Abnormal   Collection Time: 05/17/23  9:33 PM  Result Value Ref Range   aPTT 62 (H) 24 - 36 seconds    Comment:        IF BASELINE aPTT IS ELEVATED, SUGGEST PATIENT RISK ASSESSMENT BE USED TO DETERMINE APPROPRIATE ANTICOAGULANT THERAPY. Performed at [facility], [address] [place], [place] [address]   MRSA Next Gen by PCR, Nasal     Status: Abnormal   Collection Time: 05/18/23  2:57 AM   Specimen: Nasal Mucosa; Nasal Swab  Result Value Ref Range   MRSA by PCR Next Gen DETECTED (A) NOT DETECTED    Comment: RESULT CALLED TO, READ BACK BY AND VERIFIED WITH:  MARCEL TURNER AT 0506 05/18/23 JG (NOTE) The GeneXpert MRSA Assay (FDA approved for NASAL specimens only), is one component of a comprehensive MRSA colonization surveillance program. It is not intended to diagnose MRSA infection nor to guide or monitor treatment for MRSA infections. Test performance is not FDA approved in patients less than 25 years old. Performed at [facility], [address] [place], [place] [address]   Lipoprotein A (LPA)     Status: Abnormal   Collection Time: 05/18/23  6:01 AM  Result Value Ref Range   Lipoprotein (a) 96.8 (H) <75.0 nmol/L    Comment: (NOTE) Note:  Values greater than or equal to 75.0 nmol/L may       indicate an independent risk factor for CHD,       but must be evaluated with caution when applied       to non-Caucasian populations due to the       influence of genetic factors on Lp(a) across       ethnicities. Performed At: [facility] [address] [place], [place] [id number] Jolene Schimke MD NU:[id number]   CBC     Status: Abnormal   Collection Time: 05/18/23  6:01 AM   Result Value Ref Range   WBC 4.5 4.0 - 10.5 K/uL   RBC 3.39 (L) 3.87 - 5.11 MIL/uL   Hemoglobin 10.7 (L) 12.0 - 15.0 g/dL   HCT 34.7 (L) 42.5 - 95.6 %   MCV 96.8 80.0 - 100.0 fL   MCH 31.6 26.0 - 34.0 pg   MCHC 32.6 30.0 - 36.0 g/dL   RDW 38.7 56.4 - 33.2 %   Platelets 116 (L) 150 - 400 K/uL   nRBC 0.0 0.0 - 0.2 %    Comment: Performed at [facility], [address] [place], [place] [address]  APTT     Status: Abnormal   Collection Time: 05/18/23  6:01 AM  Result Value Ref Range   aPTT 90 (H) 24 - 36 seconds    Comment:        IF BASELINE aPTT IS ELEVATED, SUGGEST PATIENT RISK ASSESSMENT BE USED TO DETERMINE APPROPRIATE ANTICOAGULANT THERAPY. Performed at [facility], [address] [place], [place] [address]   Heparin level (unfractionated)     Status: Abnormal   Collection Time: 05/18/23  6:01 AM  Result Value Ref Range   Heparin Unfractionated 0.95 (H) 0.30 - 0.70 IU/mL    Comment: (NOTE) The clinical reportable range upper limit is being lowered to >1.10 to align with the FDA approved guidance for the current laboratory assay.  If heparin results are below expected values, and patient dosage has  been confirmed, suggest follow up testing of antithrombin III levels. Performed at [facility], [address] [place], [place] [address]   HIV Antibody (routine testing w rflx)     Status: None   Collection Time: 05/18/23  6:01 AM  Result Value Ref Range   HIV Screen 4th Generation wRfx Non Reactive Non Reactive    Comment: Performed at [facility], [address], [place], [place] [address]  CBC     Status: Abnormal   Collection Time: 05/18/23  2:24 PM  Result Value Ref Range   WBC 4.2 4.0 - 10.5 K/uL   RBC 3.29 (L) 3.87 - 5.11 MIL/uL   Hemoglobin 10.4 (L) 12.0 - 15.0 g/dL   HCT 69.6 (L) 29.5 - 28.4 %   MCV 98.5 80.0 - 100.0 fL   MCH 31.6 26.0 - 34.0 pg   MCHC 32.1 30.0 - 36.0 g/dL   RDW 13.2 44.0 - 10.2 %   Platelets 117  (L) 150 - 400 K/uL   nRBC 0.0 0.0 - 0.2 %    Comment: Performed at [facility], [address] [place], [place] [address]  Renal function panel     Status: Abnormal   Collection Time: 05/18/23  2:24 PM  Result Value Ref Range   Sodium 136 135 - 145 mmol/L   Potassium 4.2 3.5 - 5.1 mmol/L   Chloride 98 98 - 111 mmol/L   CO2 26 22 - 32 mmol/L   Glucose, Bld 117 (H) 70 - 99 mg/dL    Comment: Glucose reference range applies only to samples taken after fasting for at least 8 hours.   BUN 47 (H) 6 - 20 mg/dL   Creatinine, Ser 6.44 (H) 0.44 - 1.00 mg/dL   Calcium 8.7 (L) 8.9 - 10.3 mg/dL   Phosphorus 4.2 2.5 - 4.6 mg/dL   Albumin 3.4 (L) 3.5 - 5.0 g/dL   GFR, Estimated 7 (L) >60 mL/min    Comment: (NOTE) Calculated using the CKD-EPI Creatinine Equation (2021)    Anion gap 12 5 - 15    Comment: Performed at [facility], [address] [place], [place] [address]  CBC     Status: Abnormal   Collection Time: 05/19/23  7:43 AM  Result Value Ref Range   WBC 4.7 4.0 - 10.5 K/uL   RBC 3.36 (L) 3.87 - 5.11 MIL/uL   Hemoglobin 10.6 (L) 12.0 - 15.0 g/dL   HCT 25.9 (L) 56.3 - 87.5 %   MCV 97.9 80.0 - 100.0 fL   MCH 31.5 26.0 - 34.0 pg   MCHC 32.2 30.0 - 36.0 g/dL   RDW 64.3 32.9 - 51.8 %   Platelets 117 (L) 150 - 400 K/uL   nRBC 0.0 0.0 - 0.2 %    Comment: Performed at [facility], [address] [place], [place] [address]  Basic metabolic panel     Status: Abnormal   Collection Time: 05/19/23  7:43 AM  Result Value Ref Range   Sodium 136  135 - 145 mmol/L   Potassium 4.7 3.5 - 5.1 mmol/L   Chloride 97 (L) 98 - 111 mmol/L   CO2 24 22 - 32 mmol/L   Glucose, Bld 99 70 - 99 mg/dL    Comment: Glucose reference range applies only to samples taken after fasting for at least 8 hours.   BUN 56 (H) 6 - 20 mg/dL   Creatinine, Ser 5.18 (H) 0.44 - 1.00 mg/dL   Calcium 9.6 8.9 - 84.1 mg/dL   GFR, Estimated 5 (L) >60 mL/min    Comment: (NOTE) Calculated using the  CKD-EPI Creatinine Equation (2021)    Anion gap 15 5 - 15    Comment: Performed at [facility], [address] [place], [place] [address]  CBC     Status: Abnormal   Collection Time: 05/19/23  9:40 AM  Result Value Ref Range   WBC 5.3 4.0 - 10.5 K/uL   RBC 3.54 (L) 3.87 - 5.11 MIL/uL   Hemoglobin 11.0 (L) 12.0 - 15.0 g/dL   HCT 01.6 (L) 01.0 - 93.2 %   MCV 97.7 80.0 - 100.0 fL   MCH 31.1 26.0 - 34.0 pg   MCHC 31.8 30.0 - 36.0 g/dL   RDW 35.5 73.2 - 20.2 %   Platelets 128 (L) 150 - 400 K/uL    Comment: REPEATED TO VERIFY   nRBC 0.0 0.0 - 0.2 %    Comment: Performed at [facility], [address] [place], [place] [address]  Renal function panel     Status: Abnormal   Collection Time: 05/19/23  9:40 AM  Result Value Ref Range   Sodium 134 (L) 135 - 145 mmol/L   Potassium 4.7 3.5 - 5.1 mmol/L   Chloride 96 (L) 98 - 111 mmol/L   CO2 25 22 - 32 mmol/L   Glucose, Bld 111 (H) 70 - 99 mg/dL    Comment: Glucose reference range applies only to samples taken after fasting for at least 8 hours.   BUN 59 (H) 6 - 20 mg/dL   Creatinine, Ser 6.23 (H) 0.44 - 1.00 mg/dL   Calcium 9.5 8.9 - 76.2 mg/dL   Phosphorus 4.4 2.5 - 4.6 mg/dL   Albumin 3.6 3.5 - 5.0 g/dL   GFR, Estimated 5 (L) >60 mL/min    Comment: (NOTE) Calculated using the CKD-EPI Creatinine Equation (2021)    Anion gap 13 5 - 15    Comment: Performed at [facility], [address], [place], [place] [address]    Radiology ECHOCARDIOGRAM COMPLETE  Result Date: 05/17/2023    ECHOCARDIOGRAM REPORT   Patient Name:   KINDYL BARSCH [facility] Date of Exam: 05/17/2023 Medical Rec #:  [id number]         Height:       66.0 in Accession #:    [id number]        Weight:       162.0 lb Date of Birth:  1960/06/29          BSA:          1.828 m Patient Age:    62 years          BP:           103/76 mmHg Patient Gender: F                 HR:           51 bpm. Exam Location:  [facility] Procedure: 2D Echo, 3D Echo,  Cardiac  Doppler, Color Doppler and Strain Analysis Indications:     NSTEMI  History:         Patient has prior history of Echocardiogram examinations, most                  recent 12/22/2021. CHF, Acute MI and Angina, Prior CABG,                  Arrythmias:Atrial Fibrillation; Risk Factors:Hypertension,                  Dyslipidemia and Former Smoker. ESRD on Dialysis.  Sonographer:     Mikki Harbor Referring Phys:  [id number] [place] General Diagnosing Phys: Adrian Blackwater  Sonographer Comments: Global longitudinal strain was attempted. IMPRESSIONS  1. Left ventricular ejection fraction, by estimation, is 25 to 30%. The left ventricle has severely decreased function. The left ventricle demonstrates global hypokinesis. The left ventricular internal cavity size was severely dilated. There is moderate  left ventricular hypertrophy. Left ventricular diastolic parameters are consistent with Grade II diastolic dysfunction (pseudonormalization).  2. Right ventricular systolic function is severely reduced. The right ventricular size is severely enlarged. Mildly increased right ventricular wall thickness. There is severely elevated pulmonary artery systolic pressure.  3. Left atrial size was severely dilated.  4. Right atrial size was mildly dilated.  5. The mitral valve is grossly normal. Mild to moderate mitral valve regurgitation.  6. Tricuspid valve regurgitation is moderate.  7. The aortic valve is calcified. Aortic valve regurgitation is not visualized. FINDINGS  Left Ventricle: Left ventricular ejection fraction, by estimation, is 25 to 30%. The left ventricle has severely decreased function. The left ventricle demonstrates global hypokinesis. The left ventricular internal cavity size was severely dilated. There is moderate left ventricular hypertrophy. Left ventricular diastolic parameters are consistent with Grade II diastolic dysfunction (pseudonormalization). Right Ventricle: The right ventricular size is severely enlarged.  Mildly increased right ventricular wall thickness. Right ventricular systolic function is severely reduced. There is severely elevated pulmonary artery systolic pressure. The tricuspid regurgitant velocity is 3.80 m/s, and with an assumed right atrial pressure of 15 mmHg, the estimated right ventricular systolic pressure is 72.8 mmHg. Left Atrium: Left atrial size was severely dilated. Right Atrium: Right atrial size was mildly dilated. Pericardium: There is no evidence of pericardial effusion. Mitral Valve: The mitral valve is grossly normal. Mild to moderate mitral valve regurgitation. MV peak gradient, 4.8 mmHg. The mean mitral valve gradient is 1.0 mmHg. Tricuspid Valve: The tricuspid valve is grossly normal. Tricuspid valve regurgitation is moderate. Aortic Valve: The aortic valve is calcified. Aortic valve regurgitation is not visualized. Aortic valve mean gradient measures 4.5 mmHg. Aortic valve peak gradient measures 9.7 mmHg. Aortic valve area, by VTI measures 2.21 cm. Pulmonic Valve: The pulmonic valve was grossly normal. Pulmonic valve regurgitation is not visualized. Aorta: The aortic root, ascending aorta and aortic arch are all structurally normal, with no evidence of dilitation or obstruction. IAS/Shunts: No atrial level shunt detected by color flow Doppler.  LEFT VENTRICLE PLAX 2D LVIDd:         6.70 cm      Diastology LVIDs:         5.80 cm      LV e' lateral:   8.68 cm/s LV PW:         0.90 cm      LV E/e' lateral: 11.4 LV IVS:        1.40 cm LVOT diam:  2.10 cm LV SV:         67 LV SV Index:   36 LVOT Area:     3.46 cm  LV Volumes (MOD) LV vol d, MOD A2C: 204.0 ml LV vol d, MOD A4C: 167.0 ml LV vol s, MOD A2C: 141.0 ml LV vol s, MOD A4C: 125.0 ml LV SV MOD A2C:     63.0 ml LV SV MOD A4C:     167.0 ml LV SV MOD BP:      46.6 ml RIGHT VENTRICLE RV Basal diam:  4.15 cm RV Mid diam:    3.10 cm RV S prime:     7.05 cm/s TAPSE (M-mode): 1.5 cm LEFT ATRIUM              Index        RIGHT ATRIUM            Index LA diam:        5.60 cm  3.06 cm/m   RA Area:     33.30 cm LA Vol (A2C):   84.7 ml  46.32 ml/m  RA Volume:   132.00 ml 72.19 ml/m LA Vol (A4C):   107.0 ml 58.52 ml/m LA Biplane Vol: 96.9 ml  52.99 ml/m  AORTIC VALVE                    PULMONIC VALVE AV Area (Vmax):    2.10 cm     PV Vmax:       0.80 m/s AV Area (Vmean):   1.91 cm     PV Peak grad:  2.6 mmHg AV Area (VTI):     2.21 cm AV Vmax:           155.50 cm/s AV Vmean:          99.300 cm/s AV VTI:            0.301 m AV Peak Grad:      9.7 mmHg AV Mean Grad:      4.5 mmHg LVOT Vmax:         94.30 cm/s LVOT Vmean:        54.800 cm/s LVOT VTI:          0.192 m LVOT/AV VTI ratio: 0.64  AORTA Ao Root diam: 3.20 cm MITRAL VALVE               TRICUSPID VALVE MV Area (PHT): 6.17 cm    TR Peak grad:   57.8 mmHg MV Area VTI:   1.75 cm    TR Vmax:        380.00 cm/s MV Peak grad:  4.8 mmHg MV Mean grad:  1.0 mmHg    SHUNTS MV Vmax:       1.10 m/s    Systemic VTI:  0.19 m MV Vmean:      45.2 cm/s   Systemic Diam: 2.10 cm MV Decel Time: 123 msec MV E velocity: 99.10 cm/s MV A velocity: 35.50 cm/s MV E/A ratio:  2.79 Shaukat Khan Electronically signed by Adrian Blackwater Signature Date/Time: 05/17/2023/2:04:57 PM    Final    DG Chest Portable 1 View  Result Date: 05/17/2023 CLINICAL DATA:  Shortness of breath and chest pain.  On dialysis. EXAM: PORTABLE CHEST 1 VIEW COMPARISON:  Chest radiographs 01/30/2022 and CT 02/16/2022 FINDINGS: A right jugular dialysis catheter terminates near the superior cavoatrial junction. Sequelae of CABG are again identified. The cardiac silhouette remains moderately to severely enlarged. There is  mild central pulmonary vascular congestion without overt edema. Minimal left basilar opacity likely reflects atelectasis. No sizable pleural effusion or pneumothorax is identified. No acute osseous abnormality is seen. IMPRESSION: Cardiomegaly and mild pulmonary vascular congestion. Electronically Signed   By: Sebastian Ache M.D.    On: 05/17/2023 08:26   US Abdomen Limited  Result Date: 04/30/2023 CLINICAL DATA:  History of ascites and multiple prior paracentesis procedures. The most recent was performed on 04/12/2023. Evaluation for ascites prior to possible paracentesis. EXAM: LIMITED ABDOMEN ULTRASOUND FOR ASCITES TECHNIQUE: Limited ultrasound survey for ascites was performed in all four abdominal quadrants. COMPARISON:  04/20/2023 FINDINGS: A small amount of ascites is present in the right peritoneal cavity. A large enough pocket was not present to allow for safe paracentesis today. IMPRESSION: Small amount of ascites in the right peritoneal cavity. A large enough pocket was not present to allow for safe paracentesis today. Electronically Signed   By: Irish Lack M.D.   On: 04/30/2023 13:55   VAS [place] UPPER EXT VEIN MAPPING (PRE-OP AVF)  Result Date: 04/30/2023 UPPER EXTREMITY VEIN MAPPING Patient Name:  JANEISHA BREECE  Date of Exam:   04/27/2023 Medical Rec #: [id number]          Accession #:    [id number] Date of Birth: 19-Nov-1960           Patient Gender: F Patient Age:   59 years Exam Location:  [facility] Procedure:      VAS [place] UPPER EXT VEIN MAPPING (PRE-OP AVF) Referring Phys: Festus Barren --------------------------------------------------------------------------------  History: 02/11/2023 PTA mid upper arm access site           11/2022 PTA of confluence.           08/20/2022 Percutaneous transluminal angioplasty of left          basilic/axillary junction with 9 mm diameter by 6 cm length Lutonix          drug-coated          angioplasty balloon           History of Artegraft jump graft revision.  Performing Technologist: Hardie Lora RVT  Examination Guidelines: A complete evaluation includes B-mode imaging, spectral Doppler, color Doppler, and power Doppler as needed of all accessible portions of each vessel. Bilateral testing is considered an integral part of a complete examination. Limited examinations for  reoccurring indications may be performed as noted. +-----------------+-------------+----------+----------------+ Right Cephalic   Diameter (cm)Depth (cm)    Findings     +-----------------+-------------+----------+----------------+ Shoulder             0.14                                +-----------------+-------------+----------+----------------+ Prox upper arm       0.15                                +-----------------+-------------+----------+----------------+ Mid upper arm        0.11                                +-----------------+-------------+----------+----------------+ Dist upper arm       0.10               chronic thrombus +-----------------+-------------+----------+----------------+ Antecubital fossa    0.11  chronic thrombus +-----------------+-------------+----------+----------------+ Prox forearm                             not visualized  +-----------------+-------------+----------+----------------+ Mid forearm                              not visualized  +-----------------+-------------+----------+----------------+ Dist forearm                             not visualized  +-----------------+-------------+----------+----------------+ +-----------------+-------------+----------+--------+ Right Basilic    Diameter (cm)Depth (cm)Findings +-----------------+-------------+----------+--------+ Prox upper arm       0.43                        +-----------------+-------------+----------+--------+ Mid upper arm        0.33                        +-----------------+-------------+----------+--------+ Dist upper arm       0.21                        +-----------------+-------------+----------+--------+ Antecubital fossa    0.20                        +-----------------+-------------+----------+--------+ Prox forearm         0.24                        +-----------------+-------------+----------+--------+ The confluence of  the basilic and brachial veins is at the distal upper arm. Left arm not imaged, multiple prior dialysis access. Summary: Right: The cephalic vein appears to be chronically thrombosed at the        distal upper arm and antecubital fossa.         Basilic vein is patent with diameters as described above. The        basilic vein originates off the brachial vein in the distal        third of the upper arm. Left: Not imaged. Multiple failed dialysis access. *See table(s) above for measurements and observations.  Diagnosing physician: Festus Barren MD Electronically signed by Festus Barren MD on 04/30/2023 at 7:29:22 AM.    Final    VAS [place] UPPER EXTREMITY ARTERIAL DUPLEX  Result Date: 04/30/2023  UPPER EXTREMITY DUPLEX STUDY Patient Name:  ORFELINDA SANOW  Date of Exam:   04/27/2023 Medical Rec #: [id number]          Accession #:    [id number] Date of Birth: February 17, 1961           Patient Gender: F Patient Age:   72 years Exam Location:  [facility] Procedure:      VAS [place] UPPER EXTREMITY ARTERIAL DUPLEX Referring Phys: Festus Barren --------------------------------------------------------------------------------  Indications: Evaluation prior to placement of dialysis access.  Other Factors: 02/11/2023 PTA mid upper arm access site                11/2022 PTA of confluence.                 08/20/2022 Percutaneous transluminal angioplasty of left                basilic/axillary junction with 9 mm diameter by 6 cm length  Lutonix drug-coated angioplasty balloon                 History of Artegraft jump graft revision. Performing Technologist: Hardie Lora RVT  Examination Guidelines: A complete evaluation includes B-mode imaging, spectral Doppler, color Doppler, and power Doppler as needed of all accessible portions of each vessel. Bilateral testing is considered an integral part of a complete examination. Limited examinations for reoccurring indications may be performed as noted.  Right Doppler Findings:  +---------------+----------+---------+--------+--------+ Site           PSV (cm/s)Waveform StenosisComments +---------------+----------+---------+--------+--------+ Subclavian Dist          biphasic                  +---------------+----------+---------+--------+--------+ Brachial Dist  58        triphasic                 +---------------+----------+---------+--------+--------+ Radial Mid     38        biphasic                  +---------------+----------+---------+--------+--------+ Radial Dist    17        biphasic                  +---------------+----------+---------+--------+--------+ Ulnar Dist     73        triphasic                 +---------------+----------+---------+--------+--------+   Right Pre-Dialysis Findings: +----------------------------+---------+-------------------+----------+--------+ Location                    PSV      Intralum. Diam.    Waveform  Comments                             (cm/s)   (cm)                                  +----------------------------+---------+-------------------+----------+--------+ Ulnar artery distal upper   58       0.41               Triphasic          arm                                                                        +----------------------------+---------+-------------------+----------+--------+ Brachial Antecub. fossa     17       0.18               biphasic           +----------------------------+---------+-------------------+----------+--------+ Radial Art at Wrist         73       0.19               triphasic          +----------------------------+---------+-------------------+----------+--------+  Left Doppler Findings: +----+----------+--------+--------+--------+ SitePSV (cm/s)WaveformStenosisComments +----+----------+--------+--------+--------+ Left arm not imaged. Multiple prior dialysis access.  Summary:  Right: No obstruction visualized in the right upper extremity.  *See table(s) above for measurements and observations. Electronically signed by Festus Barren MD on  04/30/2023 at 7:29:16 AM.    Final     Assessment/Plan  ESRD on hemodialysis (HCC) Her left arm is healing well and I think we can use that again in 2 to 3 weeks.  We discussed just starting using this access again at the first of the year.  We will use her PermCath until that time.  Once her fistula is being used again, we can take her PermCath out.  I will plan on reassessing this fistula with a duplex in about 3 months.  Chronic combined systolic and diastolic CHF (congestive heart failure) (HCC) Managed medically.  Follows with cardiology.   Hypertension blood pressure control important in reducing the progression of atherosclerotic disease. On appropriate oral medications.  Festus Barren, MD  05/25/2023 12:45 PM    This note was created with Dragon medical transcription system.  Any errors from dictation are purely unintentional

## 2023-05-26 DIAGNOSIS — I509 Heart failure, unspecified: Secondary | ICD-10-CM | POA: Diagnosis not present

## 2023-05-26 DIAGNOSIS — Z992 Dependence on renal dialysis: Secondary | ICD-10-CM | POA: Diagnosis not present

## 2023-05-26 DIAGNOSIS — N2581 Secondary hyperparathyroidism of renal origin: Secondary | ICD-10-CM | POA: Diagnosis not present

## 2023-05-26 DIAGNOSIS — N186 End stage renal disease: Secondary | ICD-10-CM | POA: Diagnosis not present

## 2023-05-28 ENCOUNTER — Other Ambulatory Visit: Payer: Self-pay | Admitting: Internal Medicine

## 2023-05-28 DIAGNOSIS — R188 Other ascites: Secondary | ICD-10-CM

## 2023-05-28 DIAGNOSIS — Z992 Dependence on renal dialysis: Secondary | ICD-10-CM | POA: Diagnosis not present

## 2023-05-28 DIAGNOSIS — N186 End stage renal disease: Secondary | ICD-10-CM | POA: Diagnosis not present

## 2023-05-28 DIAGNOSIS — N2581 Secondary hyperparathyroidism of renal origin: Secondary | ICD-10-CM | POA: Diagnosis not present

## 2023-05-30 ENCOUNTER — Encounter: Payer: Self-pay | Admitting: Family

## 2023-05-30 NOTE — Assessment & Plan Note (Signed)
Patient is seen by Nephrology, who manage this condition.  She is well controlled with current therapy.  Is set up for procedure to obtain new dialysis access.   Will defer to them for further changes to plan of care.

## 2023-05-30 NOTE — Assessment & Plan Note (Signed)
Likely 2/2 her ESRD.  Managed by nephrology.   Will defer to them for further treatment.

## 2023-05-31 DIAGNOSIS — N186 End stage renal disease: Secondary | ICD-10-CM | POA: Diagnosis not present

## 2023-05-31 DIAGNOSIS — N2581 Secondary hyperparathyroidism of renal origin: Secondary | ICD-10-CM | POA: Diagnosis not present

## 2023-05-31 DIAGNOSIS — Z992 Dependence on renal dialysis: Secondary | ICD-10-CM | POA: Diagnosis not present

## 2023-06-01 ENCOUNTER — Ambulatory Visit
Admission: RE | Admit: 2023-06-01 | Discharge: 2023-06-01 | Disposition: A | Discharge status: Home / Self Care | Payer: 59 | Source: Ambulatory Visit | Attending: Internal Medicine | Admitting: Internal Medicine

## 2023-06-01 DIAGNOSIS — N186 End stage renal disease: Secondary | ICD-10-CM | POA: Insufficient documentation

## 2023-06-01 DIAGNOSIS — R188 Other ascites: Secondary | ICD-10-CM | POA: Diagnosis not present

## 2023-06-01 MED ORDER — LIDOCAINE HCL (PF) 1 % IJ SOLN
10.0000 mL | Freq: Once | INTRAMUSCULAR | Status: AC
  Administered 2023-06-01: 10 mL via INTRADERMAL
  Filled 2023-06-01: qty 10

## 2023-06-01 NOTE — Procedures (Signed)
PROCEDURE SUMMARY:  Successful image-guided paracentesis from the right abdomen.  Yielded 1.5 liters of clear, straw-colored fluid.  No immediate complications.  EBL: zero Patient tolerated well.   Specimen were not sent for labs.  Please see imaging section of Epic for full dictation.  Bing Neighbors Kelii Chittum PA-C 06/01/2023 11:21 AM

## 2023-06-02 DIAGNOSIS — N186 End stage renal disease: Secondary | ICD-10-CM | POA: Diagnosis not present

## 2023-06-02 DIAGNOSIS — Z992 Dependence on renal dialysis: Secondary | ICD-10-CM | POA: Diagnosis not present

## 2023-06-02 DIAGNOSIS — N2581 Secondary hyperparathyroidism of renal origin: Secondary | ICD-10-CM | POA: Diagnosis not present

## 2023-06-03 ENCOUNTER — Ambulatory Visit (INDEPENDENT_AMBULATORY_CARE_PROVIDER_SITE_OTHER): Payer: 59 | Admitting: Cardiovascular Disease

## 2023-06-03 ENCOUNTER — Encounter: Payer: Self-pay | Admitting: Cardiovascular Disease

## 2023-06-03 VITALS — BP 108/68 | Ht 66.0 in | Wt 154.0 lb

## 2023-06-03 DIAGNOSIS — R0602 Shortness of breath: Secondary | ICD-10-CM | POA: Diagnosis not present

## 2023-06-03 DIAGNOSIS — Z992 Dependence on renal dialysis: Secondary | ICD-10-CM | POA: Diagnosis not present

## 2023-06-03 DIAGNOSIS — I480 Paroxysmal atrial fibrillation: Secondary | ICD-10-CM

## 2023-06-03 DIAGNOSIS — I5020 Unspecified systolic (congestive) heart failure: Secondary | ICD-10-CM

## 2023-06-03 DIAGNOSIS — N186 End stage renal disease: Secondary | ICD-10-CM | POA: Diagnosis not present

## 2023-06-03 DIAGNOSIS — E782 Mixed hyperlipidemia: Secondary | ICD-10-CM

## 2023-06-03 DIAGNOSIS — Z0130 Encounter for examination of blood pressure without abnormal findings: Secondary | ICD-10-CM

## 2023-06-03 NOTE — Progress Notes (Signed)
Cardiology Office Note   Date:  06/03/2023   ID:  [name], DOB [birth year], MRN [id number]  PCP:  Miki Kins, FNP  Cardiologist:  Adrian Blackwater, MD      History of Present Illness: [name] is a 62 y.o. female who presents for  Chief Complaint  Patient presents with   Hospitalization Follow-up    Hospital follow up    Was admitted with chest pain and mildly elevated troponin. Had bradycardia and hypotension, needing midrodine. LVEF 25% on echo but could not toleated losartan 25.      Past Medical History:  Diagnosis Date   Adult behavior problems    frontal lobe CVA   Anemia    chronic disease   Atrial flutter by electrocardiogram (HCC) 03/22/2014   Cardiomyopathy (HCC)    CHF (congestive heart failure) (HCC)    Chicken pox    Chronic respiratory failure with hypoxia (HCC) 12/19/2021   Coronary artery disease    Delayed surgical wound healing 04/12/2014   Overview:  Left leg   GERD (gastroesophageal reflux disease)    H/O atrial flutter 04/12/2014   H/O stroke without residual deficits 02/13/2014   H/O: substance abuse (HCC) 02/27/2014   Heart failure (HCC)    Hepatitis    history of hep c   History of CVA (cerebrovascular accident) 02/21/2014   History of delirium 04/12/2014   Hyperkalemia 11/13/2014   Hyperlipidemia    Hypertension    Hypotension 02/24/2014   Leukocytosis 02/26/2014   Myocardial infarction [facility])    Obesity    Paroxysmal atrial fibrillation (HCC)    Peripheral vascular disease (HCC)    Postoperative anemia due to acute blood loss 02/27/2014   Renal failure    Renal insufficiency    Stroke [facility]) 2011   Superficial incisional surgical site infection 03/14/2014   Syncope and collapse 07/25/2020     Past Surgical History:  Procedure Laterality Date   A/V FISTULAGRAM Left 03/30/2019   Procedure: A/V FISTULAGRAM;  Surgeon: Annice Needy, MD;  Location: [facility] INVASIVE CV LAB;  Service: Cardiovascular;   Laterality: Left;   A/V FISTULAGRAM Left 10/06/2019   Procedure: A/V FISTULAGRAM;  Surgeon: Annice Needy, MD;  Location: [facility] INVASIVE CV LAB;  Service: Cardiovascular;  Laterality: Left;   A/V FISTULAGRAM Left 05/19/2021   Procedure: A/V FISTULAGRAM;  Surgeon: Annice Needy, MD;  Location: [facility] INVASIVE CV LAB;  Service: Cardiovascular;  Laterality: Left;   A/V FISTULAGRAM Left 01/29/2022   Procedure: A/V Fistulagram;  Surgeon: Annice Needy, MD;  Location: [facility] INV[facility] CV LAB;  Service: Cardiovascular;  Laterality: Left;   A/V FISTULAGRAM Left 04/30/2022   Procedure: A/V Fistulagram;  Surgeon: Annice Needy, MD;  Location: [facility] CV LAB;  Service: Cardiovascular;  Laterality: Left;   A/V FISTULAGRAM Left 08/20/2022   Procedure: A/V Fistulagram;  Surgeon: Annice Needy, MD;  Location: [facility] CV LAB;  Service: Cardiovascular;  Laterality: Left;   A/V FISTULAGRAM N/A 12/03/2022   Procedure: A/V Fistulagram;  Surgeon: Annice Needy, MD;  Location: [facility]VAS[facility] CV LAB;  Service: Cardiovascular;  Laterality: N/A;   A/V FISTULAGRAM Left 02/11/2023   Procedure: A/V Fistulagram;  Surgeon: Annice Needy, MD;  Location: [facility] INVASIVE CV LAB;  Service: Cardiovascular;  Laterality: Left;   COLONOSCOPY     COLONOSCOPY WITH PROPOFOL N/A 04/22/2021   Procedure: COLONOSCOPY WITH PROPOFOL;  Surgeon: Regis Bill, MD;  Location: [facility] ENDOSCOPY;  Service: Endoscopy;  Laterality: N/A;   CORONARY ANGIOPLASTY WITH STENT PLACEMENT  2013   CORONARY ARTERY BYPASS GRAFT     DIALYSIS FISTULA CREATION     DIALYSIS/PERMA CATHETER INSERTION N/A 04/08/2023   Procedure: DIALYSIS/PERMA CATHETER INSERTION;  Surgeon: Annice Needy, MD;  Location: [facility] INVASIVE CV LAB;  Service: Cardiovascular;  Laterality: N/A;   ESOPHAGOGASTRODUODENOSCOPY     ESOPHAGOGASTRODUODENOSCOPY N/A 04/22/2021   Procedure: ESOPHAGOGASTRODUODENOSCOPY (EGD);  Surgeon: Regis Bill, MD;  Location: [facility] ENDOSCOPY;  Service: Endoscopy;   Laterality: N/A;   FLEXIBLE SIGMOIDOSCOPY N/A 02/23/2019   Procedure: FLEXIBLE SIGMOIDOSCOPY;  Surgeon: Christena Deem, MD;  Location: [facility] ENDOSCOPY;  Service: Endoscopy;  Laterality: N/A;   LEFT HEART CATH AND CORS/GRAFTS ANGIOGRAPHY N/A 08/08/2018   Procedure: LEFT HEART CATH AND CORS/GRAFTS ANGIOGRAPHY;  Surgeon: Laurier Nancy, MD;  Location: [facility] INVASIVE CV LAB;  Service: Cardiovascular;  Laterality: N/A;   LEFT HEART CATH AND CORS/GRAFTS ANGIOGRAPHY N/A 01/30/2019   Procedure: LEFT HEART CATH AND CORS/GRAFTS ANGIOGRAPHY;  Surgeon: Laurier Nancy, MD;  Location: [facility] INVASIVE CV LAB;  Service: Cardiovascular;  Laterality: N/A;   percutaneous insertion intra aortic balloon right cath     ultrasound guided pericardiocentesis       Current Outpatient Medications  Medication Sig Dispense Refill   albuterol (VENTOLIN HFA) 108 (90 Base) MCG/ACT inhaler Inhale 1-2 puffs into the lungs every 6 (six) hours as needed for wheezing or shortness of breath. 20.1 g 3   apixaban (ELIQUIS) 5 MG TABS tablet Take 5 mg by mouth 2 (two) times daily.     Bismuth Tribromoph-Petrolatum (XEROFORM PETROLAT PATCH 2"X2") PADS Apply 1 each topically every 3 (three) days. 25 each 0   hydrOXYzine (ATARAX/VISTARIL) 50 MG tablet Take 50 mg by mouth at bedtime.     levothyroxine (SYNTHROID) 75 MCG tablet Take 75 mcg by mouth daily before breakfast.      midodrine (PROAMATINE) 10 MG tablet Take 1 tablet (10 mg total) by mouth 3 (three) times daily. Monday, Wednesday, Friday with hemodialysis 90 tablet 0   omeprazole (PRILOSEC) 20 MG capsule Take 20 mg by mouth daily.     oxyCODONE-acetaminophen (PERCOCET/ROXICET) 5-325 MG tablet Take 1 tablet by mouth every 6 (six) hours as needed for severe pain (pain score 7-10). 20 tablet 0   ranolazine (RANEXA) 1000 MG SR tablet Take 1,000 mg by mouth 2 (two) times daily.     rosuvastatin (CRESTOR) 20 MG tablet Take 20 mg by mouth at bedtime.     sevelamer carbonate (RENVELA)  800 MG tablet Take 3,200 mg by mouth 3 (three) times daily with meals.     VELTASSA 8.4 g packet Take 1 packet by mouth daily.     No current facility-administered medications for this visit.   Facility-Administered Medications Ordered in Other Visits  Medication Dose Route Frequency Provider Last Rate Last Admin   heparin injection    PRN Annice Needy, MD   10,000 Units at 04/08/23 1412    Allergies:   Morphine and codeine, Levaquin [levofloxacin], Enalapril, Latex, and Tape    Social History:   reports that she has been smoking cigarettes. She has a 5 pack-year smoking history. She has never used smokeless tobacco. She reports that she does not drink alcohol and does not use drugs.   Family History:  family history includes Breast cancer in her maternal aunt and maternal grandmother; Cancer in an other family member; Diabetes type II in her maternal grandmother and mother; Hypertension in her  father, mother, sister, and another family member; Kidney disease in her father; Ovarian cancer in her mother; Renal Disease in an other family member.    ROS:     Review of Systems  Constitutional: Negative.   HENT: Negative.    Eyes: Negative.   Respiratory: Negative.    Gastrointestinal: Negative.   Genitourinary: Negative.   Musculoskeletal: Negative.   Skin: Negative.   Neurological: Negative.   Endo/Heme/Allergies: Negative.   Psychiatric/Behavioral: Negative.    All other systems reviewed and are negative.     All other systems are reviewed and negative.    PHYSICAL EXAM: VS:  BP 108/68   Ht 5\' 6"  (1.676 m)   Wt 154 lb (69.9 kg)   BMI 24.86 kg/m  , BMI Body mass index is 24.86 kg/m. Last weight:  Wt Readings from Last 3 Encounters:  06/03/23 154 lb (69.9 kg)  05/19/23 153 lb 7 oz (69.6 kg)  04/27/23 155 lb 6.4 oz (70.5 kg)     Physical Exam Constitutional:      Appearance: Normal appearance.  Cardiovascular:     Rate and Rhythm: Normal rate and regular rhythm.      Heart sounds: Normal heart sounds.  Pulmonary:     Effort: Pulmonary effort is normal.     Breath sounds: Normal breath sounds.  Musculoskeletal:     Right lower leg: No edema.     Left lower leg: No edema.  Neurological:     Mental Status: She is alert.       EKG:   Recent Labs: 05/17/2023: ALT 16 05/19/2023: BUN 59; Creatinine, Ser 8.16; Hemoglobin 11.0; Platelets 128; Potassium 4.7; Sodium 134    Lipid Panel    Component Value Date/Time   CHOL 140 01/18/2019 1358   CHOL 79 06/14/2014 0404   TRIG 148 01/18/2019 1358   TRIG 85 06/14/2014 0404   HDL 43 01/18/2019 1358   HDL 35 (L) 06/14/2014 0404   CHOLHDL 3.3 01/18/2019 1358   VLDL 30 01/18/2019 1358   VLDL 17 06/14/2014 0404   LDLCALC 67 01/18/2019 1358   LDLCALC 27 06/14/2014 0404      Other studies Reviewed: Additional studies/ records that were reviewed today include:  Review of the above records demonstrates:       No data to display            ASSESSMENT AND PLAN:    ICD-10-CM   1. SOB (shortness of breath)  R06.02    Less SOB after paracentesis    2. ESRD on hemodialysis (HCC)  N18.6    Z99.2     3. Mixed hyperlipidemia  E78.2     4. Paroxysmal atrial fibrillation (HCC)  I48.0     5. Systolic congestive heart failure, unspecified HF chronicity (HCC)  I50.20    Unable to tolerate GDMT, losartan had to be stopped. HR too low for coreg.       Problem List Items Addressed This Visit       Cardiovascular and Mediastinum   Paroxysmal atrial fibrillation (HCC)     Genitourinary   ESRD on hemodialysis (HCC) (Chronic)     Other   Hyperlipidemia   Other Visit Diagnoses       SOB (shortness of breath)    -  Primary   Less SOB after paracentesis     Systolic congestive heart failure, unspecified HF chronicity (HCC)       Unable to tolerate GDMT, losartan had to be stopped. HR  too low for coreg.          Disposition:   Return in about 6 weeks (around 07/15/2023).    Total time  spent: 30 minutes  Signed,  Adrian Blackwater, MD  06/03/2023 10:41 AM    [facility]

## 2023-06-04 DIAGNOSIS — N2581 Secondary hyperparathyroidism of renal origin: Secondary | ICD-10-CM | POA: Diagnosis not present

## 2023-06-04 DIAGNOSIS — Z992 Dependence on renal dialysis: Secondary | ICD-10-CM | POA: Diagnosis not present

## 2023-06-04 DIAGNOSIS — N186 End stage renal disease: Secondary | ICD-10-CM | POA: Diagnosis not present

## 2023-06-07 DIAGNOSIS — N186 End stage renal disease: Secondary | ICD-10-CM | POA: Diagnosis not present

## 2023-06-07 DIAGNOSIS — Z992 Dependence on renal dialysis: Secondary | ICD-10-CM | POA: Diagnosis not present

## 2023-06-07 DIAGNOSIS — N2581 Secondary hyperparathyroidism of renal origin: Secondary | ICD-10-CM | POA: Diagnosis not present

## 2023-06-10 DIAGNOSIS — N2581 Secondary hyperparathyroidism of renal origin: Secondary | ICD-10-CM | POA: Diagnosis not present

## 2023-06-10 DIAGNOSIS — N186 End stage renal disease: Secondary | ICD-10-CM | POA: Diagnosis not present

## 2023-06-10 DIAGNOSIS — Z992 Dependence on renal dialysis: Secondary | ICD-10-CM | POA: Diagnosis not present

## 2023-06-11 DIAGNOSIS — N186 End stage renal disease: Secondary | ICD-10-CM | POA: Diagnosis not present

## 2023-06-11 DIAGNOSIS — N2581 Secondary hyperparathyroidism of renal origin: Secondary | ICD-10-CM | POA: Diagnosis not present

## 2023-06-11 DIAGNOSIS — Z992 Dependence on renal dialysis: Secondary | ICD-10-CM | POA: Diagnosis not present

## 2023-06-14 DIAGNOSIS — Z992 Dependence on renal dialysis: Secondary | ICD-10-CM | POA: Diagnosis not present

## 2023-06-14 DIAGNOSIS — N186 End stage renal disease: Secondary | ICD-10-CM | POA: Diagnosis not present

## 2023-06-14 DIAGNOSIS — N2581 Secondary hyperparathyroidism of renal origin: Secondary | ICD-10-CM | POA: Diagnosis not present

## 2023-06-15 DIAGNOSIS — Z992 Dependence on renal dialysis: Secondary | ICD-10-CM | POA: Diagnosis not present

## 2023-06-15 DIAGNOSIS — N186 End stage renal disease: Secondary | ICD-10-CM | POA: Diagnosis not present

## 2023-06-16 DIAGNOSIS — Z992 Dependence on renal dialysis: Secondary | ICD-10-CM | POA: Diagnosis not present

## 2023-06-16 DIAGNOSIS — N2581 Secondary hyperparathyroidism of renal origin: Secondary | ICD-10-CM | POA: Diagnosis not present

## 2023-06-16 DIAGNOSIS — N186 End stage renal disease: Secondary | ICD-10-CM | POA: Diagnosis not present

## 2023-06-18 DIAGNOSIS — N2581 Secondary hyperparathyroidism of renal origin: Secondary | ICD-10-CM | POA: Diagnosis not present

## 2023-06-18 DIAGNOSIS — N186 End stage renal disease: Secondary | ICD-10-CM | POA: Diagnosis not present

## 2023-06-18 DIAGNOSIS — Z992 Dependence on renal dialysis: Secondary | ICD-10-CM | POA: Diagnosis not present

## 2023-06-21 DIAGNOSIS — Z992 Dependence on renal dialysis: Secondary | ICD-10-CM | POA: Diagnosis not present

## 2023-06-21 DIAGNOSIS — N186 End stage renal disease: Secondary | ICD-10-CM | POA: Diagnosis not present

## 2023-06-21 DIAGNOSIS — N2581 Secondary hyperparathyroidism of renal origin: Secondary | ICD-10-CM | POA: Diagnosis not present

## 2023-06-22 ENCOUNTER — Ambulatory Visit: Payer: 59 | Admitting: Family

## 2023-06-22 VITALS — BP 82/52 | HR 68 | Ht 66.0 in | Wt 159.0 lb

## 2023-06-22 DIAGNOSIS — I5020 Unspecified systolic (congestive) heart failure: Secondary | ICD-10-CM

## 2023-06-22 DIAGNOSIS — I5042 Chronic combined systolic (congestive) and diastolic (congestive) heart failure: Secondary | ICD-10-CM | POA: Diagnosis not present

## 2023-06-22 DIAGNOSIS — I9589 Other hypotension: Secondary | ICD-10-CM | POA: Diagnosis not present

## 2023-06-22 DIAGNOSIS — Z0130 Encounter for examination of blood pressure without abnormal findings: Secondary | ICD-10-CM

## 2023-06-22 MED ORDER — OXYCODONE-ACETAMINOPHEN 5-325 MG PO TABS: 1.0000 | ORAL_TABLET | Freq: Four times a day (QID) | ORAL | 0 refills | Status: DC | PRN

## 2023-06-22 MED ORDER — MIDODRINE HCL 10 MG PO TABS: 10.0000 mg | ORAL_TABLET | Freq: Three times a day (TID) | ORAL | 0 refills | Status: DC

## 2023-06-23 DIAGNOSIS — Z992 Dependence on renal dialysis: Secondary | ICD-10-CM | POA: Diagnosis not present

## 2023-06-23 DIAGNOSIS — N186 End stage renal disease: Secondary | ICD-10-CM | POA: Diagnosis not present

## 2023-06-23 DIAGNOSIS — N2581 Secondary hyperparathyroidism of renal origin: Secondary | ICD-10-CM | POA: Diagnosis not present

## 2023-06-25 DIAGNOSIS — Z992 Dependence on renal dialysis: Secondary | ICD-10-CM | POA: Diagnosis not present

## 2023-06-25 DIAGNOSIS — N2581 Secondary hyperparathyroidism of renal origin: Secondary | ICD-10-CM | POA: Diagnosis not present

## 2023-06-25 DIAGNOSIS — N186 End stage renal disease: Secondary | ICD-10-CM | POA: Diagnosis not present

## 2023-06-26 DIAGNOSIS — I509 Heart failure, unspecified: Secondary | ICD-10-CM | POA: Diagnosis not present

## 2023-06-28 DIAGNOSIS — N2581 Secondary hyperparathyroidism of renal origin: Secondary | ICD-10-CM | POA: Diagnosis not present

## 2023-06-28 DIAGNOSIS — Z992 Dependence on renal dialysis: Secondary | ICD-10-CM | POA: Diagnosis not present

## 2023-06-28 DIAGNOSIS — N186 End stage renal disease: Secondary | ICD-10-CM | POA: Diagnosis not present

## 2023-06-30 ENCOUNTER — Telehealth (INDEPENDENT_AMBULATORY_CARE_PROVIDER_SITE_OTHER): Payer: Self-pay

## 2023-06-30 DIAGNOSIS — N2581 Secondary hyperparathyroidism of renal origin: Secondary | ICD-10-CM | POA: Diagnosis not present

## 2023-06-30 DIAGNOSIS — N186 End stage renal disease: Secondary | ICD-10-CM | POA: Diagnosis not present

## 2023-06-30 DIAGNOSIS — Z992 Dependence on renal dialysis: Secondary | ICD-10-CM | POA: Diagnosis not present

## 2023-06-30 NOTE — Telephone Encounter (Signed)
I attempted to contact the patient to schedule a permcath removal. A message was left for a return call. 

## 2023-07-01 NOTE — Telephone Encounter (Signed)
Sheralyn Boatman from [employer] called wanting to know what was happening with the patient having a permcath removal. I explained that she was called and it was sent to voicemail. Sheralyn Boatman asked if I could send the information to her. Patient is scheduled on 07/06/23 with a 2:00 pm arrival time to the [facility] for permcath removal.

## 2023-07-02 DIAGNOSIS — N186 End stage renal disease: Secondary | ICD-10-CM | POA: Diagnosis not present

## 2023-07-02 DIAGNOSIS — N2581 Secondary hyperparathyroidism of renal origin: Secondary | ICD-10-CM | POA: Diagnosis not present

## 2023-07-02 DIAGNOSIS — Z992 Dependence on renal dialysis: Secondary | ICD-10-CM | POA: Diagnosis not present

## 2023-07-05 DIAGNOSIS — N186 End stage renal disease: Secondary | ICD-10-CM | POA: Diagnosis not present

## 2023-07-05 DIAGNOSIS — Z992 Dependence on renal dialysis: Secondary | ICD-10-CM | POA: Diagnosis not present

## 2023-07-05 DIAGNOSIS — N2581 Secondary hyperparathyroidism of renal origin: Secondary | ICD-10-CM | POA: Diagnosis not present

## 2023-07-06 ENCOUNTER — Encounter
Admission: RE | Disposition: A | Discharge status: Home / Self Care | Payer: Self-pay | Source: Home / Self Care | Attending: Vascular Surgery

## 2023-07-06 ENCOUNTER — Ambulatory Visit
Admission: RE | Admit: 2023-07-06 | Discharge: 2023-07-06 | Disposition: A | Discharge status: Home / Self Care | Payer: 59 | Attending: Vascular Surgery | Admitting: Vascular Surgery

## 2023-07-06 ENCOUNTER — Ambulatory Visit (INDEPENDENT_AMBULATORY_CARE_PROVIDER_SITE_OTHER): Payer: 59 | Admitting: Family

## 2023-07-06 ENCOUNTER — Encounter: Payer: Self-pay | Admitting: Family

## 2023-07-06 VITALS — BP 120/84 | HR 62 | Ht 66.0 in | Wt 158.0 lb

## 2023-07-06 DIAGNOSIS — Z4901 Encounter for fitting and adjustment of extracorporeal dialysis catheter: Secondary | ICD-10-CM | POA: Insufficient documentation

## 2023-07-06 DIAGNOSIS — N186 End stage renal disease: Secondary | ICD-10-CM | POA: Diagnosis not present

## 2023-07-06 DIAGNOSIS — Z95828 Presence of other vascular implants and grafts: Secondary | ICD-10-CM

## 2023-07-06 DIAGNOSIS — Z9889 Other specified postprocedural states: Secondary | ICD-10-CM

## 2023-07-06 DIAGNOSIS — I9589 Other hypotension: Secondary | ICD-10-CM

## 2023-07-06 DIAGNOSIS — Z992 Dependence on renal dialysis: Secondary | ICD-10-CM | POA: Diagnosis not present

## 2023-07-06 DIAGNOSIS — G4452 New daily persistent headache (NDPH): Secondary | ICD-10-CM | POA: Insufficient documentation

## 2023-07-06 DIAGNOSIS — M654 Radial styloid tenosynovitis [de Quervain]: Secondary | ICD-10-CM | POA: Diagnosis not present

## 2023-07-06 DIAGNOSIS — Z452 Encounter for adjustment and management of vascular access device: Secondary | ICD-10-CM

## 2023-07-06 HISTORY — PX: DIALYSIS/PERMA CATHETER REMOVAL: CATH118289

## 2023-07-06 SURGERY — DIALYSIS/PERMA CATHETER REMOVAL
Anesthesia: LOCAL

## 2023-07-06 MED ORDER — LIDOCAINE-EPINEPHRINE (PF) 1 %-1:200000 IJ SOLN
INTRAMUSCULAR | Status: DC | PRN
  Administered 2023-07-06: 20 mL

## 2023-07-06 SURGICAL SUPPLY — 2 items
BLADE SURG SZ10 CARB STEEL (BLADE) IMPLANT
TRAY LACERAT/PLASTIC (MISCELLANEOUS) IMPLANT

## 2023-07-06 NOTE — Assessment & Plan Note (Signed)
Will get CT scan of head given her symptoms and the duration.  She has contraindications to contrast, so will get w/o.  Follow up after CT.

## 2023-07-06 NOTE — Progress Notes (Signed)
MRN : [id number]  [name] is a 63 y.o. (31-Dec-1960) female who presents with chief complaint of check access.  History of Present Illness:   Patient presents to [facility] for removal of her tunneled catheter.  The patient reports the function of the access has been stable. Patient denies difficulty with cannulation. The patient denies increased bleeding time after removing the needles. The patient denies hand pain or other symptoms consistent with steal phenomena.  No significant arm swelling.  The patient denies any complaints from the dialysis center or their nephrologist.  The patient denies redness or swelling at the access site. The patient denies fever or chills at home or while on dialysis.  No recent shortening of the patient's walking distance or new symptoms consistent with claudication.  No history of rest pain symptoms. No new ulcers or wounds of the lower extremities have occurred.  The patient denies amaurosis fugax or recent TIA symptoms. There are no recent neurological changes noted. There is no history of DVT, PE or superficial thrombophlebitis. No recent episodes of angina or shortness of breath documented.     No outpatient medications have been marked as taking for the 07/06/23 encounter [facility] Encounter).    Past Medical History:  Diagnosis Date   Adult behavior problems    frontal lobe CVA   Anemia    chronic disease   Atrial flutter by electrocardiogram (HCC) 03/22/2014   Cardiomyopathy (HCC)    CHF (congestive heart failure) (HCC)    Chicken pox    Chronic respiratory failure with hypoxia (HCC) 12/19/2021   Coronary artery disease    Delayed surgical wound healing 04/12/2014   Overview:  Left leg   GERD (gastroesophageal reflux disease)    H/O atrial flutter 04/12/2014   H/O stroke without residual deficits 02/13/2014   H/O: substance abuse (HCC) 02/27/2014   Heart failure (HCC)     Hepatitis    history of hep c   History of CVA (cerebrovascular accident) 02/21/2014   History of delirium 04/12/2014   Hyperkalemia 11/13/2014   Hyperlipidemia    Hypertension    Hypotension 02/24/2014   Leukocytosis 02/26/2014   Myocardial infarction [facility])    Obesity    Paroxysmal atrial fibrillation (HCC)    Peripheral vascular disease (HCC)    Postoperative anemia due to acute blood loss 02/27/2014   Renal failure    Renal insufficiency    Stroke [facility]) 2011   Superficial incisional surgical site infection 03/14/2014   Syncope and collapse 07/25/2020    Past Surgical History:  Procedure Laterality Date   A/V FISTULAGRAM Left 03/30/2019   Procedure: A/V FISTULAGRAM;  Surgeon: Annice Needy, MD;  Location: [facility] INVASIVE CV LAB;  Service: Cardiovascular;  Laterality: Left;   A/V FISTULAGRAM Left 10/06/2019   Procedure: A/V FISTULAGRAM;  Surgeon: Annice Needy, MD;  Location: [facility] INVASIVE CV LAB;  Service: Cardiovascular;  Laterality: Left;   A/V FISTULAGRAM Left 05/19/2021   Procedure: A/V FISTULAGRAM;  Surgeon: Annice Needy, MD;  Location: [facility] INVASIVE CV LAB;  Service: Cardiovascular;  Laterality: Left;   A/V FISTULAGRAM Left 01/29/2022   Procedure: A/V Fistulagram;  Surgeon: Annice Needy, MD;  Location: [facility] INVASIVE CV LAB;  Service: Cardiovascular;  Laterality: Left;   A/V FISTULAGRAM Left 04/30/2022   Procedure: A/V Fistulagram;  Surgeon: Annice Needy, MD;  Location: [facility] INVASIVE  CV LAB;  Service: Cardiovascular;  Laterality: Left;   A/V FISTULAGRAM Left 08/20/2022   Procedure: A/V Fistulagram;  Surgeon: Annice Needy, MD;  Location: [facility] INVASIVE CV LAB;  Service: Cardiovascular;  Laterality: Left;   A/V FISTULAGRAM N/A 12/03/2022   Procedure: A/V Fistulagram;  Surgeon: Annice Needy, MD;  Location: [facility] INVASIVE CV LAB;  Service: Cardiovascular;  Laterality: N/A;   A/V FISTULAGRAM Left 02/11/2023   Procedure: A/V Fistulagram;  Surgeon: Annice Needy, MD;  Location: [facility] INVASIVE  CV LAB;  Service: Cardiovascular;  Laterality: Left;   COLONOSCOPY     COLONOSCOPY WITH PROPOFOL N/A 04/22/2021   Procedure: COLONOSCOPY WITH PROPOFOL;  Surgeon: Regis Bill, MD;  Location: [facility] ENDOSCOPY;  Service: Endoscopy;  Laterality: N/A;   CORONARY ANGIOPLASTY WITH STENT PLACEMENT  2013   CORONARY ARTERY BYPASS GRAFT     DIALYSIS FISTULA CREATION     DIALYSIS/PERMA CATHETER INSERTION N/A 04/08/2023   Procedure: DIALYSIS/PERMA CATHETER INSERTION;  Surgeon: Annice Needy, MD;  Location: [facility] INVASIVE CV LAB;  Service: Cardiovascular;  Laterality: N/A;   ESOPHAGOGASTRODUODENOSCOPY     ESOPHAGOGASTRODUODENOSCOPY N/A 04/22/2021   Procedure: ESOPHAGOGASTRODUODENOSCOPY (EGD);  Surgeon: Regis Bill, MD;  Location: [facility] ENDOSCOPY;  Service: Endoscopy;  Laterality: N/A;   FLEXIBLE SIGMOIDOSCOPY N/A 02/23/2019   Procedure: FLEXIBLE SIGMOIDOSCOPY;  Surgeon: Christena Deem, MD;  Location: [facility] ENDOSCOPY;  Service: Endoscopy;  Laterality: N/A;   LEFT HEART CATH AND CORS/GRAFTS ANGIOGRAPHY N/A 08/08/2018   Procedure: LEFT HEART CATH AND CORS/GRAFTS ANGIOGRAPHY;  Surgeon: Laurier Nancy, MD;  Location: [facility] INVASIVE CV LAB;  Service: Cardiovascular;  Laterality: N/A;   LEFT HEART CATH AND CORS/GRAFTS ANGIOGRAPHY N/A 01/30/2019   Procedure: LEFT HEART CATH AND CORS/GRAFTS ANGIOGRAPHY;  Surgeon: Laurier Nancy, MD;  Location: [facility] INVASIVE CV LAB;  Service: Cardiovascular;  Laterality: N/A;   percutaneous insertion intra aortic balloon right cath     ultrasound guided pericardiocentesis      Social History Social History   Tobacco Use   Smoking status: Some Days    Current packs/day: 0.25    Average packs/day: 0.3 packs/day for 20.0 years (5.0 ttl pk-yrs)    Types: Cigarettes   Smokeless tobacco: Never   Tobacco comments:    smokes two cigarettes a day  Vaping Use   Vaping status: Never Used  Substance Use Topics   Alcohol use: No   Drug use: No    Family  History Family History  Problem Relation Age of Onset   Hypertension Mother    Ovarian cancer Mother    Diabetes type II Mother    Hypertension Father    Kidney disease Father    Hypertension Sister    Diabetes type II Maternal Grandmother    Breast cancer Maternal Grandmother    Breast cancer Maternal Aunt    Hypertension Other    Cancer Other    Renal Disease Other     Allergies  Allergen Reactions   Morphine And Codeine Hives   Levaquin [Levofloxacin] Itching    Severe itching; prickly sensation   Enalapril Other (See Comments)    hallucinations   Latex Rash   Tape Rash and Other (See Comments)     REVIEW OF SYSTEMS (Negative unless checked)  Constitutional: [] Weight loss  [] Fever  [] Chills Cardiac: [] Chest pain   [] Chest pressure   [] Palpitations   [] Shortness of breath when laying flat   [] Shortness of breath with exertion. Vascular:  [] Pain in legs with walking   []   Pain in legs at rest  [] History of DVT   [] Phlebitis   [] Swelling in legs   [] Varicose veins   [] Non-healing ulcers Pulmonary:   [] Uses home oxygen   [] Productive cough   [] Hemoptysis   [] Wheeze  [] COPD   [] Asthma Neurologic:  [] Dizziness   [] Seizures   [] History of stroke   [] History of TIA  [] Aphasia   [] Vissual changes   [] Weakness or numbness in arm   [] Weakness or numbness in leg Musculoskeletal:   [] Joint swelling   [] Joint pain   [] Low back pain Hematologic:  [] Easy bruising  [] Easy bleeding   [] Hypercoagulable state   [] Anemic Gastrointestinal:  [] Diarrhea   [] Vomiting  [] Gastroesophageal reflux/heartburn   [] Difficulty swallowing. Genitourinary:  [x] Chronic kidney disease   [] Difficult urination  [] Frequent urination   [] Blood in urine Skin:  [] Rashes   [] Ulcers  Psychological:  [] History of anxiety   []  History of major depression.  Physical Examination  Vitals:   07/06/23 1334  BP: 127/77  Pulse: 93  Resp: 16  Temp: 97.6 F (36.4 C)  TempSrc: Oral  SpO2: 95%   There is no height or  weight on file to calculate BMI. Gen: WD/WN, NAD Head: [place]/AT, No temporalis wasting.  Ear/Nose/Throat: Hearing grossly intact, nares w/o erythema or drainage Eyes: PER, EOMI, sclera nonicteric.  Neck: Supple, no gross masses or lesions.  No JVD.  Pulmonary:  Good air movement, no audible wheezing, no use of accessory muscles.  Cardiac: RRR, precordium non-hyperdynamic. Vascular:   Left arm basilic fistula good thrill good bruit Vessel Right Left  Radial Palpable Palpable  Brachial Palpable Palpable  Gastrointestinal: soft, non-distended. No guarding/no peritoneal signs.  Musculoskeletal: M/S 5/5 throughout.  No deformity.  Neurologic: CN 2-12 intact. Pain and light touch intact in extremities.  Symmetrical.  Speech is fluent. Motor exam as listed above. Psychiatric: Judgment intact, Mood & affect appropriate for pt's clinical situation. Dermatologic: No rashes or ulcers noted.  No changes consistent with cellulitis.   CBC Lab Results  Component Value Date   WBC 5.3 05/19/2023   HGB 11.0 (L) 05/19/2023   HCT 34.6 (L) 05/19/2023   MCV 97.7 05/19/2023   PLT 128 (L) 05/19/2023    BMET    Component Value Date/Time   NA 134 (L) 05/19/2023 0940   NA 139 07/09/2014 0503   K 4.7 05/19/2023 0940   K 4.7 07/11/2014 0958   CL 96 (L) 05/19/2023 0940   CL 98 07/09/2014 0503   CO2 25 05/19/2023 0940   CO2 30 07/09/2014 0503   GLUCOSE 111 (H) 05/19/2023 0940   GLUCOSE 107 (H) 07/09/2014 0503   BUN 59 (H) 05/19/2023 0940   BUN 39 (H) 07/09/2014 0503   CREATININE 8.16 (H) 05/19/2023 0940   CREATININE 5.53 (H) 07/09/2014 0503   CALCIUM 9.5 05/19/2023 0940   CALCIUM 8.3 (L) 07/09/2014 0503   GFRNONAA 5 (L) 05/19/2023 0940   GFRNONAA 9 (L) 07/09/2014 0503   GFRNONAA 9 (L) 02/11/2014 0300   GFRAA 6 (L) 02/06/2020 0708   GFRAA 10 (L) 07/09/2014 0503   GFRAA 10 (L) 02/11/2014 0300   CrCl cannot be calculated (Patient's most recent lab result is older than the maximum 21 days  allowed.).  COAG Lab Results  Component Value Date   INR 1.6 (H) 05/17/2023   INR 1.6 (H) 02/16/2022   INR 1.6 (H) 08/07/2021    Radiology No results found.   Assessment/Plan ESRD on hemodialysis [facility]) Recommend:  The patient is doing  well and currently has adequate dialysis access. The patient's dialysis center is not reporting any access issues. Given this she no longer needs a permacath and will be removed to prevent septic complications.  Risks and benefits are reviewed all questions answered patient has agreed to proceed.  The patient should have a duplex ultrasound of the dialysis access in 3 months. The patient will follow-up with Dr Wyn Quaker in the office after each ultrasound     Chronic combined systolic and diastolic CHF (congestive heart failure) (HCC) Managed medically.  Follows with cardiology.   Hypertension blood pressure control important in reducing the progression of atherosclerotic disease. On appropriate oral medications.     Levora Dredge, MD  07/06/2023 2:17 PM

## 2023-07-06 NOTE — Interval H&P Note (Signed)
History and Physical Interval Note:  07/06/2023 2:21 PM  [name]  has presented today for surgery, with the diagnosis of Perma Cath Removal   End Stage Renal.  The various methods of treatment have been discussed with the patient and family. After consideration of risks, benefits and other options for treatment, the patient has consented to  Procedure(s): DIALYSIS/PERMA CATHETER REMOVAL (N/A) as a surgical intervention.  The patient's history has been reviewed, patient examined, no change in status, stable for surgery.  I have reviewed the patient's chart and labs.  Questions were answered to the patient's satisfaction.     Levora Dredge

## 2023-07-06 NOTE — Op Note (Signed)
  OPERATIVE NOTE   PROCEDURE: Removal of a right IJ tunneled dialysis catheter  PRE-OPERATIVE DIAGNOSIS: Complication of dialysis catheter, End stage renal disease  POST-OPERATIVE DIAGNOSIS: Same  SURGEON: Levora Dredge, M.D.  ANESTHESIA: Local anesthetic with 1% lidocaine with epinephrine   ESTIMATED BLOOD LOSS: Minimal   FINDING(S): 1. Catheter intact   SPECIMEN(S):  Catheter  INDICATIONS:   [name] is a 63 y.o. female who presents with functioning access.  The patient has undergone placement of an extremity access which is working and this has been successfully cannulated without difficulty.  therefore is undergoing removal of his tunneled catheter which is no longer needed to avoid septic complications.   DESCRIPTION: After obtaining full informed written consent, the patient was positioned supine. The right IJ catheter and surrounding area is prepped and draped in a sterile fashion. The cuff was localized by palpation and noted to be less than 3 cm from the exit site. After appropriate timeout is called, 1% lidocaine with epinephrine is infiltrated into the surrounding tissues around the cuff. Small transverse incision is created at the exit site with an 11 blade scalpel and the dissection was carried up along the catheter to expose the cuff of the tunneled catheter.  The catheter cuff is then freed from the surrounding attachments and adhesions. Once the catheter has been freed circumferentially it is removed in 1 piece. Light pressure was held at the base of the neck.   Antibiotic ointment and a sterile dressing is applied to the exit site. Patient tolerated procedure well and there were no complications.  COMPLICATIONS: None  CONDITION: Unchanged  Levora Dredge, M.D. [facility] Office: [phone]  07/06/2023,3:58 PM

## 2023-07-06 NOTE — Assessment & Plan Note (Signed)
Continue increased midodrine.

## 2023-07-06 NOTE — Progress Notes (Signed)
Established Patient Office Visit  Subjective:  Patient ID: [name], female    DOB: 01/18/1961  Age: 63 y.o. MRN: [id number]  Chief Complaint  Patient presents with   Follow-up    2 week follow up    Patient is here today for her 2 weeks follow up.  She has been feeling fairly well since last appointment.   She does have additional concerns to discuss today.  Having pain in her right thumb, shooting down her right arm from the base of her thumb.  Is still having headaches, despite blood pressure being much better.   Labs are due today. She needs refills.   I have reviewed her active problem list, medication list, allergies, notes from last encounter, lab results for her appointment today.      No other concerns at this time.   Past Medical History:  Diagnosis Date   Adult behavior problems    frontal lobe CVA   Anemia    chronic disease   Atrial flutter by electrocardiogram (HCC) 03/22/2014   Cardiomyopathy (HCC)    CHF (congestive heart failure) (HCC)    Chicken pox    Chronic respiratory failure with hypoxia (HCC) 12/19/2021   Coronary artery disease    Delayed surgical wound healing 04/12/2014   Overview:  Left leg   GERD (gastroesophageal reflux disease)    H/O atrial flutter 04/12/2014   H/O stroke without residual deficits 02/13/2014   H/O: substance abuse (HCC) 02/27/2014   Heart failure (HCC)    Hepatitis    history of hep c   History of CVA (cerebrovascular accident) 02/21/2014   History of delirium 04/12/2014   Hyperkalemia 11/13/2014   Hyperlipidemia    Hypertension    Hypotension 02/24/2014   Leukocytosis 02/26/2014   Myocardial infarction [facility])    Obesity    Paroxysmal atrial fibrillation (HCC)    Peripheral vascular disease (HCC)    Postoperative anemia due to acute blood loss 02/27/2014   Renal failure    Renal insufficiency    Stroke [facility]) 2011   Superficial incisional surgical site infection 03/14/2014   Syncope and collapse  07/25/2020    Past Surgical History:  Procedure Laterality Date   A/V FISTULAGRAM Left 03/30/2019   Procedure: A/V FISTULAGRAM;  Surgeon: Annice Needy, MD;  Location: [facility] INVASIVE CV LAB;  Service: Cardiovascular;  Laterality: Left;   A/V FISTULAGRAM Left 10/06/2019   Procedure: A/V FISTULAGRAM;  Surgeon: Annice Needy, MD;  Location: [facility] INVASIVE CV LAB;  Service: Cardiovascular;  Laterality: Left;   A/V FISTULAGRAM Left 05/19/2021   Procedure: A/V FISTULAGRAM;  Surgeon: Annice Needy, MD;  Location: [facility] CV LAB;  Service: Cardiovascular;  Laterality: Left;   A/V FISTULAGRAM Left 01/29/2022   Procedure: A/V Fistulagram;  Surgeon: Annice Needy, MD;  Location: [facility] CV LAB;  Service: Cardiovascular;  Laterality: Left;   A/V FISTULAGRAM Left 04/30/2022   Procedure: A/V Fistulagram;  Surgeon: Annice Needy, MD;  Location: [facility] CV [facility];  Service: Cardiovascular;  Laterality: Left;   A/V FISTULAGRAM Left 08/20/2022   Procedure: A/V Fistulagram;  Surgeon: Annice Needy, MD;  Location: [facility] CV LAB;  Service: Cardiovascular;  Laterality: Left;   A/V FISTULAGRAM N/A 12/03/2022   Procedure: A/V Fistulagram;  Surgeon: Annice Needy, MD;  Location: [facility] CV LAB;  Service: Cardiovascular;  Laterality: N/A;   A/V FISTULAGRAM Left 02/11/2023   Procedure: A/V Fistulagram;  Surgeon: Annice Needy, MD;  Location: [facility] CV LAB;  Service: Cardiovascular;  Laterality: Left;   COLONOSCOPY     COLONOSCOPY WITH PROPOFOL N/A 04/22/2021   Procedure: COLONOSCOPY WITH PROPOFOL;  Surgeon: Regis Bill, MD;  Location: [facility] ENDOSCOPY;  Service: Endoscopy;  Laterality: N/A;   CORONARY ANGIOPLASTY WITH STENT PLACEMENT  2013   CORONARY ARTERY BYPASS GRAFT     DIALYSIS FISTULA CREATION     DIALYSIS/PERMA CATHETER INSERTION N/A 04/08/2023   Procedure: DIALYSIS/PERMA CATHETER INSERTION;  Surgeon: Annice Needy, MD;  Location: [facility] CV LAB;  Service: Cardiovascular;   Laterality: N/A;   ESOPHAGOGASTRODUODENOSCOPY     ESOPHAGOGASTRODUODENOSCOPY N/A 04/22/2021   Procedure: ESOPHAGOGASTRODUODENOSCOPY (EGD);  Surgeon: Regis Bill, MD;  Location: [facility] ENDOSCOPY;  Service: Endoscopy;  Laterality: N/A;   FLEXIBLE SIGMOIDOSCOPY N/A 02/23/2019   Procedure: FLEXIBLE SIGMOIDOSCOPY;  Surgeon: Christena Deem, MD;  Location: [facility] ENDOSCOPY;  Service: Endoscopy;  Laterality: N/A;   LEFT HEART CATH AND CORS/GRAFTS ANGIOGRAPHY N/A 08/08/2018   Procedure: LEFT HEART CATH AND CORS/GRAFTS ANGIOGRAPHY;  Surgeon: Laurier Nancy, MD;  Location: [facility] CV LAB;  Service: Cardiovascular;  Laterality: N/A;   LEFT HEART CATH AND CORS/GRAFTS ANGIOGRAPHY N/A 01/30/2019   Procedure: LEFT HEART CATH AND CORS/GRAFTS ANGIOGRAPHY;  Surgeon: Laurier Nancy, MD;  Location: [facility]IVE CV LAB;  Service: Cardiovascular;  Laterality: N/A;   percutaneous insertion intra aortic balloon right cath     ultrasound guided pericardiocentesis      Social History   Socioeconomic History   Marital status: Single    Spouse name: Not on file   Number of children: Not on file   Years of education: Not on file   Highest education level: Not on file  Occupational History   Not on file  Tobacco Use   Smoking status: Some Days    Current packs/day: 0.25    Average packs/day: 0.3 packs/day for 20.0 years (5.0 ttl pk-yrs)    Types: Cigarettes   Smokeless tobacco: Never   Tobacco comments:    smokes two cigarettes a day  Vaping Use   Vaping status: Never Used  Substance and Sexual Activity   Alcohol use: No   Drug use: No   Sexual activity: Not Currently    Birth control/protection: Post-menopausal  Other Topics Concern   Not on file  Social History Narrative   Not on file   Social Drivers of Health   Financial Resource Strain: Not on file  Food Insecurity: No Food Insecurity (05/18/2023)   Hunger Vital Sign    Worried About Running Out of Food in the Last Year: Never  true    Ran Out of Food in the Last Year: Never true  Transportation Needs: No Transportation Needs (05/18/2023)   PRAPARE - Administrator, Civil Service (Medical): No    Lack of Transportation (Non-Medical): No  Physical Activity: Not on file  Stress: Not on file  Social Connections: Not on file  Intimate Partner Violence: Not At Risk (05/18/2023)   Humiliation, Afraid, Rape, and Kick questionnaire    Fear of Current or Ex-Partner: No    Emotionally Abused: No    Physically Abused: No    Sexually Abused: No    Family History  Problem Relation Age of Onset   Hypertension Mother    Ovarian cancer Mother    Diabetes type II Mother    Hypertension Father    Kidney disease Father    Hypertension Sister    Diabetes  type II Maternal Grandmother    Breast cancer Maternal Grandmother    Breast cancer Maternal Aunt    Hypertension Other    Cancer Other    Renal Disease Other     Allergies  Allergen Reactions   Morphine And Codeine Hives   Levaquin [Levofloxacin] Itching    Severe itching; prickly sensation   Enalapril Other (See Comments)    hallucinations   Latex Rash   Tape Rash and Other (See Comments)    Review of Systems  All other systems reviewed and are negative.      Objective:   BP 120/84   Pulse 62   Ht 5\' 6"  (1.676 m)   Wt 158 lb (71.7 kg)   SpO2 97%   BMI 25.50 kg/m   Vitals:   07/06/23 0915  BP: 120/84  Pulse: 62  Height: 5\' 6"  (1.676 m)  Weight: 158 lb (71.7 kg)  SpO2: 97%  BMI (Calculated): 25.51    Physical Exam Vitals and nursing note reviewed.  Constitutional:      Appearance: Normal appearance. She is normal weight.  HENT:     Head: Normocephalic.  Eyes:     Extraocular Movements: Extraocular movements intact.     Conjunctiva/sclera: Conjunctivae normal.     Pupils: Pupils are equal, round, and reactive to light.  Cardiovascular:     Rate and Rhythm: Normal rate.  Pulmonary:     Effort: Pulmonary effort is normal.   Musculoskeletal:     Right hand: Tenderness (thumb) present. Decreased strength.  Neurological:     General: No focal deficit present.     Mental Status: She is alert and oriented to person, place, and time. Mental status is at baseline.  Psychiatric:        Mood and Affect: Mood normal.        Behavior: Behavior normal.        Thought Content: Thought content normal.        Judgment: Judgment normal.      No results found for any visits on 07/06/23.  Recent Results (from the past 2160 hours)  Potassium [facility] vascular lab only)     Status: Abnormal   Collection Time: 04/08/23  1:02 PM  Result Value Ref Range   Potassium [facility] vascular lab) 5.3 (H) 3.5 - 5.1 mmol/L    Comment: Performed at [facility], [address] [place], [place] [address]  CBC with Differential     Status: Abnormal   Collection Time: 05/17/23  7:47 AM  Result Value Ref Range   WBC 4.9 4.0 - 10.5 K/uL   RBC 3.72 (L) 3.87 - 5.11 MIL/uL   Hemoglobin 11.7 (L) 12.0 - 15.0 g/dL   HCT 60.4 54.0 - 98.1 %   MCV 99.7 80.0 - 100.0 fL   MCH 31.5 26.0 - 34.0 pg   MCHC 31.5 30.0 - 36.0 g/dL   RDW 19.1 47.8 - 29.5 %   Platelets 113 (L) 150 - 400 K/uL   nRBC 0.0 0.0 - 0.2 %   Neutrophils Relative % 63 %   Neutro Abs 3.1 1.7 - 7.7 K/uL   Lymphocytes Relative 22 %   Lymphs Abs 1.1 0.7 - 4.0 K/uL   Monocytes Relative 11 %   Monocytes Absolute 0.5 0.1 - 1.0 K/uL   Eosinophils Relative 3 %   Eosinophils Absolute 0.1 0.0 - 0.5 K/uL   Basophils Relative 0 %   Basophils Absolute 0.0 0.0 - 0.1 K/uL  Immature Granulocytes 1 %   Abs Immature Granulocytes 0.03 0.00 - 0.07 K/uL    Comment: Performed at [facility], [address] [place], [place] [address]  Comprehensive metabolic panel     Status: Abnormal   Collection Time: 05/17/23  7:47 AM  Result Value Ref Range   Sodium 137 135 - 145 mmol/L   Potassium 5.0 3.5 - 5.1 mmol/L   Chloride 100 98 - 111 mmol/L   CO2 22 22 - 32 mmol/L   Glucose,  Bld 102 (H) 70 - 99 mg/dL    Comment: Glucose reference range applies only to samples taken after fasting for at least 8 hours.   BUN 64 (H) 6 - 20 mg/dL   Creatinine, Ser 1.91 (H) 0.44 - 1.00 mg/dL   Calcium 9.6 8.9 - 47.8 mg/dL   Total Protein 7.7 6.5 - 8.1 g/dL   Albumin 3.6 3.5 - 5.0 g/dL   AST 24 15 - 41 U/L   ALT 16 0 - 44 U/L   Alkaline Phosphatase 105 38 - 126 U/L   Total Bilirubin 0.8 <1.2 mg/dL   GFR, Estimated 5 (L) >60 mL/min    Comment: (NOTE) Calculated using the CKD-EPI Creatinine Equation (2021)    Anion gap 15 5 - 15    Comment: Performed at [facility], [address], [place], [place] [address]  Troponin I (High Sensitivity)     Status: Abnormal   Collection Time: 05/17/23  7:47 AM  Result Value Ref Range   Troponin I (High Sensitivity) 75 (H) <18 ng/L    Comment: (NOTE) Elevated high sensitivity troponin I (hsTnI) values and significant  changes across serial measurements may suggest ACS but many other  chronic and acute conditions are known to elevate hsTnI results.  Refer to the "Links" section for chest pain algorithms and additional  guidance. Performed at [facility], [address] [place], [place] [address]   Heparin level (unfractionated)     Status: Abnormal   Collection Time: 05/17/23  7:47 AM  Result Value Ref Range   Heparin Unfractionated >1.10 (H) 0.30 - 0.70 IU/mL    Comment: (NOTE) The clinical reportable range upper limit is being lowered to >1.10 to align with the FDA approved guidance for the current laboratory assay.  If heparin results are below expected values, and patient dosage has  been confirmed, suggest follow up testing of antithrombin III levels. Performed at [facility], [address] [place], [place] [address]   APTT     Status: None   Collection Time: 05/17/23  7:47 AM  Result Value Ref Range   aPTT 30 24 - 36 seconds    Comment: Performed at [facility], [address] [name], [place] [address]  Protime-INR     Status: Abnormal   Collection Time: 05/17/23  7:47 AM  Result Value Ref Range   Prothrombin Time 18.9 (H) 11.4 - 15.2 seconds   INR 1.6 (H) 0.8 - 1.2    Comment: (NOTE) INR goal varies based on device and disease states. Performed at [facility], [address] [place], [place] [address]   Lactic acid, plasma     Status: None   Collection Time: 05/17/23  8:54 AM  Result Value Ref Range   Lactic Acid, Venous 0.9 0.5 - 1.9 mmol/L    Comment: Performed at [facility], [address], [place], [place] [address]  Troponin I (High Sensitivity)  Status: Abnormal   Collection Time: 05/17/23 10:48 AM  Result Value Ref Range   Troponin I (High Sensitivity) 68 (H) <18 ng/L    Comment: (NOTE) Elevated high sensitivity troponin I (hsTnI) values and significant  changes across serial measurements may suggest ACS but many other  chronic and acute conditions are known to elevate hsTnI results.  Refer to the "Links" section for chest pain algorithms and additional  guidance. Performed at [facility], [address] [place], [place] [address]   ECHOCARDIOGRAM COMPLETE     Status: None   Collection Time: 05/17/23  1:58 PM  Result Value Ref Range   Weight 2,592 oz   Height 66 in   BP 103/76 mmHg   Ao pk vel 1.56 m/s   AV Area VTI 2.21 cm2   AR max vel 2.10 cm2   AV Mean grad 4.5 mmHg   AV Peak grad 9.7 mmHg   Single Plane A2C EF 30.9 %   Single Plane A4C EF 25.1 %   Calc EF 24.5 %   S' Lateral 5.80 cm   AV Area mean vel 1.91 cm2   Area-P 1/2 6.17 cm2   MV VTI 1.75 cm2   Est EF 25 - 30%   Hepatitis B surface antigen     Status: None   Collection Time: 05/17/23  2:50 PM  Result Value Ref Range   Hepatitis B Surface Ag NON REACTIVE NON REACTIVE    Comment: Performed at [facility], [address], [place], [place] [address]  Hepatitis B surface antibody,quantitative     Status: Abnormal   Collection  Time: 05/17/23  2:50 PM  Result Value Ref Range   Hep B S AB Quant (Post) <3.5 (L) Immunity>10 mIU/mL    Comment: (NOTE)  Status of Immunity                     Anti-HBs Level  ------------------                     -------------- Inconsistent with Immunity                  0.0 - 10.0 Consistent with Immunity                         >10.0 Performed At: [facility] [address] [place], [place] [id number] Jolene Schimke MD WG:[id number]   APTT     Status: Abnormal   Collection Time: 05/17/23  9:33 PM  Result Value Ref Range   aPTT 62 (H) 24 - 36 seconds    Comment:        IF BASELINE aPTT IS ELEVATED, SUGGEST PATIENT RISK ASSESSMENT BE USED TO DETERMINE APPROPRIATE ANTICOAGULANT THERAPY. Performed at [facility], [address] [place], [place] [address]   MRSA Next Gen by PCR, Nasal     Status: Abnormal   Collection Time: 05/18/23  2:57 AM   Specimen: Nasal Mucosa; Nasal Swab  Result Value Ref Range   MRSA by PCR Next Gen DETECTED (A) NOT DETECTED    Comment: RESULT CALLED TO, READ BACK BY AND VERIFIED WITH:  MARCEL TURNER AT 0506 05/18/23 JG (NOTE) The GeneXpert MRSA Assay (FDA approved for NASAL specimens only), is one component of a comprehensive MRSA colonization surveillance program. It is not intended to diagnose MRSA infection nor to guide or monitor treatment for MRSA infections. Test performance is not  FDA approved in patients less than 33 years old. Performed at [facility], [address] [place], [place] [address]   Lipoprotein A (LPA)     Status: Abnormal   Collection Time: 05/18/23  6:01 AM  Result Value Ref Range   Lipoprotein (a) 96.8 (H) <75.0 nmol/L    Comment: (NOTE) Note:  Values greater than or equal to 75.0 nmol/L may       indicate an independent risk factor for CHD,       but must be evaluated with caution when applied       to non-Caucasian populations due to the       influence of genetic factors on Lp(a)  across       ethnicities. Performed At: [facility] [address] [place], [place] [id number] Jolene Schimke MD NW:[id number]   CBC     Status: Abnormal   Collection Time: 05/18/23  6:01 AM  Result Value Ref Range   WBC 4.5 4.0 - 10.5 K/uL   RBC 3.39 (L) 3.87 - 5.11 MIL/uL   Hemoglobin 10.7 (L) 12.0 - 15.0 g/dL   HCT 57.8 (L) 46.9 - 62.9 %   MCV 96.8 80.0 - 100.0 fL   MCH 31.6 26.0 - 34.0 pg   MCHC 32.6 30.0 - 36.0 g/dL   RDW 52.8 41.3 - 24.4 %   Platelets 116 (L) 150 - 400 K/uL   nRBC 0.0 0.0 - 0.2 %    Comment: Performed at [facility], [address] [place], [place] [address]  APTT     Status: Abnormal   Collection Time: 05/18/23  6:01 AM  Result Value Ref Range   aPTT 90 (H) 24 - 36 seconds    Comment:        IF BASELINE aPTT IS ELEVATED, SUGGEST PATIENT RISK ASSESSMENT BE USED TO DETERMINE APPROPRIATE ANTICOAGULANT THERAPY. Performed at [facility], [name] [place], [place] [address]   Heparin level (unfractionated)     Status: Abnormal   Collection Time: 05/18/23  6:01 AM  Result Value Ref Range   Heparin Unfractionated 0.95 (H) 0.30 - 0.70 IU/mL    Comment: (NOTE) The clinical reportable range upper limit is being lowered to >1.10 to align with the FDA approved guidance for the current laboratory assay.  If heparin results are below expected values, and patient dosage has  been confirmed, suggest follow up testing of antithrombin III levels. Performed at [facility], [address] [place], [place] [address]   HIV Antibody (routine testing w rflx)     Status: None   Collection Time: 05/18/23  6:01 AM  Result Value Ref Range   HIV Screen 4th Generation wRfx Non Reactive Non Reactive    Comment: Performed at [facility], [address] [place], [place] [address]  CBC     Status: Abnormal   Collection Time: 05/18/23  2:24 PM  Result Value Ref Range   WBC 4.2 4.0 - 10.5 K/uL   RBC 3.29 (L) 3.87 -  5.11 MIL/uL   Hemoglobin 10.4 (L) 12.0 - 15.0 g/dL   HCT 56.3 (L) 87.5 - 64.3 %   MCV 98.5 80.0 - 100.0 fL   MCH 31.6 26.0 - 34.0 pg   MCHC 32.1 30.0 - 36.0 g/dL   RDW 32.9 51.8 - 84.1 %   Platelets 117 (L) 150 - 400 K/uL   nRBC 0.0 0.0 - 0.2 %    Comment: Performed at [facility], [address] [place], [place] [address]  Renal function panel     Status: Abnormal   Collection Time: 05/18/23  2:24 PM  Result Value Ref Range   Sodium 136 135 - 145 mmol/L   Potassium 4.2 3.5 - 5.1 mmol/L   Chloride 98 98 - 111 mmol/L   CO2 26 22 - 32 mmol/L   Glucose, Bld 117 (H) 70 - 99 mg/dL    Comment: Glucose reference range applies only to samples taken after fasting for at least 8 hours.   BUN 47 (H) 6 - 20 mg/dL   Creatinine, Ser 6.04 (H) 0.44 - 1.00 mg/dL   Calcium 8.7 (L) 8.9 - 10.3 mg/dL   Phosphorus 4.2 2.5 - 4.6 mg/dL   Albumin 3.4 (L) 3.5 - 5.0 g/dL   GFR, Estimated 7 (L) >60 mL/min    Comment: (NOTE) Calculated using the CKD-EPI Creatinine Equation (2021)    Anion gap 12 5 - 15    Comment: Performed at [facility], [address] [place], [place] [address]  CBC     Status: Abnormal   Collection Time: 05/19/23  7:43 AM  Result Value Ref Range   WBC 4.7 4.0 - 10.5 K/uL   RBC 3.36 (L) 3.87 - 5.11 MIL/uL   Hemoglobin 10.6 (L) 12.0 - 15.0 g/dL   HCT 11.9 (L) 14.7 - 82.9 %   MCV 97.9 80.0 - 100.0 fL   MCH 31.5 26.0 - 34.0 pg   MCHC 32.2 30.0 - 36.0 g/dL   RDW 56.2 13.0 - 86.5 %   Platelets 117 (L) 150 - 400 K/uL   nRBC 0.0 0.0 - 0.2 %    Comment: Performed at [facility], [address], [place], [place] [address]  Basic metabolic panel     Status: Abnormal   Collection Time: 05/19/23  7:43 AM  Result Value Ref Range   Sodium 136 135 - 145 mmol/L   Potassium 4.7 3.5 - 5.1 mmol/L   Chloride 97 (L) 98 - 111 mmol/L   CO2 24 22 - 32 mmol/L   Glucose, Bld 99 70 - 99 mg/dL    Comment: Glucose reference range applies only to samples taken after  fasting for at least 8 hours.   BUN 56 (H) 6 - 20 mg/dL   Creatinine, Ser 6.29 (H) 0.44 - 1.00 mg/dL   Calcium 9.6 8.9 - 52.8 mg/dL   GFR, Estimated 5 (L) >60 mL/min    Comment: (NOTE) Calculated using the CKD-EPI Creatinine Equation (2021)    Anion gap 15 5 - 15    Comment: Performed at [facility], [address] [place], [place] [address]  CBC     Status: Abnormal   Collection Time: 05/19/23  9:40 AM  Result Value Ref Range   WBC 5.3 4.0 - 10.5 K/uL   RBC 3.54 (L) 3.87 - 5.11 MIL/uL   Hemoglobin 11.0 (L) 12.0 - 15.0 g/dL   HCT 40.1 (L) 02.7 - 25.3 %   MCV 97.7 80.0 - 100.0 fL   MCH 31.1 26.0 - 34.0 pg   MCHC 31.8 30.0 - 36.0 g/dL   RDW 66.4 40.3 - 47.4 %   Platelets 128 (L) 150 - 400 K/uL    Comment: REPEATED TO VERIFY   nRBC 0.0 0.0 - 0.2 %    Comment: Performed at [facility], [address], [place], [place] [address]  Renal function panel     Status: Abnormal  Collection Time: 05/19/23  9:40 AM  Result Value Ref Range   Sodium 134 (L) 135 - 145 mmol/L   Potassium 4.7 3.5 - 5.1 mmol/L   Chloride 96 (L) 98 - 111 mmol/L   CO2 25 22 - 32 mmol/L   Glucose, Bld 111 (H) 70 - 99 mg/dL    Comment: Glucose reference range applies only to samples taken after fasting for at least 8 hours.   BUN 59 (H) 6 - 20 mg/dL   Creatinine, Ser 1.61 (H) 0.44 - 1.00 mg/dL   Calcium 9.5 8.9 - 09.6 mg/dL   Phosphorus 4.4 2.5 - 4.6 mg/dL   Albumin 3.6 3.5 - 5.0 g/dL   GFR, Estimated 5 (L) >60 mL/min    Comment: (NOTE) Calculated using the CKD-EPI Creatinine Equation (2021)    Anion gap 13 5 - 15    Comment: Performed at [facility], [address] [place], [place] [address]       Assessment & Plan:   Problem List Items Addressed This Visit       Cardiovascular and Mediastinum   Chronic hypotension (Chronic)   Continue increased midodrine.         Musculoskeletal and Integument   De Quervain's tenosynovitis, right   Sending referral to Ortho  for her, as this has worsened since her last appointment.        Relevant Orders   Ambulatory referral to Orthopedic Surgery     Other   [facility] (new persistent daily headache) - Primary   Will get CT scan of head given her symptoms and the duration.  She has contraindications to contrast, so will get w/o.  Follow up after CT.       Relevant Orders   CT HEAD WO CONTRAST ( )    Return 1 week after CT scan.   Total time spent: 20 minutes  Miki Kins, FNP  07/06/2023   This document may have been prepared by [facility] Voice Recognition software and as such may include unintentional dictation errors.

## 2023-07-06 NOTE — H&P (View-Only) (Signed)
MRN : [id number]  [name] is a 63 y.o. (31-Dec-1960) female who presents with chief complaint of check access.  History of Present Illness:   Patient presents to [facility] for removal of her tunneled catheter.  The patient reports the function of the access has been stable. Patient denies difficulty with cannulation. The patient denies increased bleeding time after removing the needles. The patient denies hand pain or other symptoms consistent with steal phenomena.  No significant arm swelling.  The patient denies any complaints from the dialysis center or their nephrologist.  The patient denies redness or swelling at the access site. The patient denies fever or chills at home or while on dialysis.  No recent shortening of the patient's walking distance or new symptoms consistent with claudication.  No history of rest pain symptoms. No new ulcers or wounds of the lower extremities have occurred.  The patient denies amaurosis fugax or recent TIA symptoms. There are no recent neurological changes noted. There is no history of DVT, PE or superficial thrombophlebitis. No recent episodes of angina or shortness of breath documented.     No outpatient medications have been marked as taking for the 07/06/23 encounter [facility] Encounter).    Past Medical History:  Diagnosis Date   Adult behavior problems    frontal lobe CVA   Anemia    chronic disease   Atrial flutter by electrocardiogram (HCC) 03/22/2014   Cardiomyopathy (HCC)    CHF (congestive heart failure) (HCC)    Chicken pox    Chronic respiratory failure with hypoxia (HCC) 12/19/2021   Coronary artery disease    Delayed surgical wound healing 04/12/2014   Overview:  Left leg   GERD (gastroesophageal reflux disease)    H/O atrial flutter 04/12/2014   H/O stroke without residual deficits 02/13/2014   H/O: substance abuse (HCC) 02/27/2014   Heart failure (HCC)     Hepatitis    history of hep c   History of CVA (cerebrovascular accident) 02/21/2014   History of delirium 04/12/2014   Hyperkalemia 11/13/2014   Hyperlipidemia    Hypertension    Hypotension 02/24/2014   Leukocytosis 02/26/2014   Myocardial infarction [facility])    Obesity    Paroxysmal atrial fibrillation (HCC)    Peripheral vascular disease (HCC)    Postoperative anemia due to acute blood loss 02/27/2014   Renal failure    Renal insufficiency    Stroke [facility]) 2011   Superficial incisional surgical site infection 03/14/2014   Syncope and collapse 07/25/2020    Past Surgical History:  Procedure Laterality Date   A/V FISTULAGRAM Left 03/30/2019   Procedure: A/V FISTULAGRAM;  Surgeon: Annice Needy, MD;  Location: [facility] INVASIVE CV LAB;  Service: Cardiovascular;  Laterality: Left;   A/V FISTULAGRAM Left 10/06/2019   Procedure: A/V FISTULAGRAM;  Surgeon: Annice Needy, MD;  Location: [facility] INVASIVE CV LAB;  Service: Cardiovascular;  Laterality: Left;   A/V FISTULAGRAM Left 05/19/2021   Procedure: A/V FISTULAGRAM;  Surgeon: Annice Needy, MD;  Location: [facility] INVASIVE CV LAB;  Service: Cardiovascular;  Laterality: Left;   A/V FISTULAGRAM Left 01/29/2022   Procedure: A/V Fistulagram;  Surgeon: Annice Needy, MD;  Location: [facility] INVASIVE CV LAB;  Service: Cardiovascular;  Laterality: Left;   A/V FISTULAGRAM Left 04/30/2022   Procedure: A/V Fistulagram;  Surgeon: Annice Needy, MD;  Location: [facility] INVASIVE  CV LAB;  Service: Cardiovascular;  Laterality: Left;   A/V FISTULAGRAM Left 08/20/2022   Procedure: A/V Fistulagram;  Surgeon: Annice Needy, MD;  Location: [facility] INVASIVE CV LAB;  Service: Cardiovascular;  Laterality: Left;   A/V FISTULAGRAM N/A 12/03/2022   Procedure: A/V Fistulagram;  Surgeon: Annice Needy, MD;  Location: [facility] INVASIVE CV LAB;  Service: Cardiovascular;  Laterality: N/A;   A/V FISTULAGRAM Left 02/11/2023   Procedure: A/V Fistulagram;  Surgeon: Annice Needy, MD;  Location: [facility] INVASIVE  CV LAB;  Service: Cardiovascular;  Laterality: Left;   COLONOSCOPY     COLONOSCOPY WITH PROPOFOL N/A 04/22/2021   Procedure: COLONOSCOPY WITH PROPOFOL;  Surgeon: Regis Bill, MD;  Location: [facility] ENDOSCOPY;  Service: Endoscopy;  Laterality: N/A;   CORONARY ANGIOPLASTY WITH STENT PLACEMENT  2013   CORONARY ARTERY BYPASS GRAFT     DIALYSIS FISTULA CREATION     DIALYSIS/PERMA CATHETER INSERTION N/A 04/08/2023   Procedure: DIALYSIS/PERMA CATHETER INSERTION;  Surgeon: Annice Needy, MD;  Location: [facility] INVASIVE CV LAB;  Service: Cardiovascular;  Laterality: N/A;   ESOPHAGOGASTRODUODENOSCOPY     ESOPHAGOGASTRODUODENOSCOPY N/A 04/22/2021   Procedure: ESOPHAGOGASTRODUODENOSCOPY (EGD);  Surgeon: Regis Bill, MD;  Location: [facility] ENDOSCOPY;  Service: Endoscopy;  Laterality: N/A;   FLEXIBLE SIGMOIDOSCOPY N/A 02/23/2019   Procedure: FLEXIBLE SIGMOIDOSCOPY;  Surgeon: Christena Deem, MD;  Location: [facility] ENDOSCOPY;  Service: Endoscopy;  Laterality: N/A;   LEFT HEART CATH AND CORS/GRAFTS ANGIOGRAPHY N/A 08/08/2018   Procedure: LEFT HEART CATH AND CORS/GRAFTS ANGIOGRAPHY;  Surgeon: Laurier Nancy, MD;  Location: [facility] INVASIVE CV LAB;  Service: Cardiovascular;  Laterality: N/A;   LEFT HEART CATH AND CORS/GRAFTS ANGIOGRAPHY N/A 01/30/2019   Procedure: LEFT HEART CATH AND CORS/GRAFTS ANGIOGRAPHY;  Surgeon: Laurier Nancy, MD;  Location: [facility] INVASIVE CV LAB;  Service: Cardiovascular;  Laterality: N/A;   percutaneous insertion intra aortic balloon right cath     ultrasound guided pericardiocentesis      Social History Social History   Tobacco Use   Smoking status: Some Days    Current packs/day: 0.25    Average packs/day: 0.3 packs/day for 20.0 years (5.0 ttl pk-yrs)    Types: Cigarettes   Smokeless tobacco: Never   Tobacco comments:    smokes two cigarettes a day  Vaping Use   Vaping status: Never Used  Substance Use Topics   Alcohol use: No   Drug use: No    Family  History Family History  Problem Relation Age of Onset   Hypertension Mother    Ovarian cancer Mother    Diabetes type II Mother    Hypertension Father    Kidney disease Father    Hypertension Sister    Diabetes type II Maternal Grandmother    Breast cancer Maternal Grandmother    Breast cancer Maternal Aunt    Hypertension Other    Cancer Other    Renal Disease Other     Allergies  Allergen Reactions   Morphine And Codeine Hives   Levaquin [Levofloxacin] Itching    Severe itching; prickly sensation   Enalapril Other (See Comments)    hallucinations   Latex Rash   Tape Rash and Other (See Comments)     REVIEW OF SYSTEMS (Negative unless checked)  Constitutional: [] Weight loss  [] Fever  [] Chills Cardiac: [] Chest pain   [] Chest pressure   [] Palpitations   [] Shortness of breath when laying flat   [] Shortness of breath with exertion. Vascular:  [] Pain in legs with walking   []   Pain in legs at rest  [] History of DVT   [] Phlebitis   [] Swelling in legs   [] Varicose veins   [] Non-healing ulcers Pulmonary:   [] Uses home oxygen   [] Productive cough   [] Hemoptysis   [] Wheeze  [] COPD   [] Asthma Neurologic:  [] Dizziness   [] Seizures   [] History of stroke   [] History of TIA  [] Aphasia   [] Vissual changes   [] Weakness or numbness in arm   [] Weakness or numbness in leg Musculoskeletal:   [] Joint swelling   [] Joint pain   [] Low back pain Hematologic:  [] Easy bruising  [] Easy bleeding   [] Hypercoagulable state   [] Anemic Gastrointestinal:  [] Diarrhea   [] Vomiting  [] Gastroesophageal reflux/heartburn   [] Difficulty swallowing. Genitourinary:  [x] Chronic kidney disease   [] Difficult urination  [] Frequent urination   [] Blood in urine Skin:  [] Rashes   [] Ulcers  Psychological:  [] History of anxiety   []  History of major depression.  Physical Examination  Vitals:   07/06/23 1334  BP: 127/77  Pulse: 93  Resp: 16  Temp: 97.6 F (36.4 C)  TempSrc: Oral  SpO2: 95%   There is no height or  weight on file to calculate BMI. Gen: WD/WN, NAD Head: [place]/AT, No temporalis wasting.  Ear/Nose/Throat: Hearing grossly intact, nares w/o erythema or drainage Eyes: PER, EOMI, sclera nonicteric.  Neck: Supple, no gross masses or lesions.  No JVD.  Pulmonary:  Good air movement, no audible wheezing, no use of accessory muscles.  Cardiac: RRR, precordium non-hyperdynamic. Vascular:   Left arm basilic fistula good thrill good bruit Vessel Right Left  Radial Palpable Palpable  Brachial Palpable Palpable  Gastrointestinal: soft, non-distended. No guarding/no peritoneal signs.  Musculoskeletal: M/S 5/5 throughout.  No deformity.  Neurologic: CN 2-12 intact. Pain and light touch intact in extremities.  Symmetrical.  Speech is fluent. Motor exam as listed above. Psychiatric: Judgment intact, Mood & affect appropriate for pt's clinical situation. Dermatologic: No rashes or ulcers noted.  No changes consistent with cellulitis.   CBC Lab Results  Component Value Date   WBC 5.3 05/19/2023   HGB 11.0 (L) 05/19/2023   HCT 34.6 (L) 05/19/2023   MCV 97.7 05/19/2023   PLT 128 (L) 05/19/2023    BMET    Component Value Date/Time   NA 134 (L) 05/19/2023 0940   NA 139 07/09/2014 0503   K 4.7 05/19/2023 0940   K 4.7 07/11/2014 0958   CL 96 (L) 05/19/2023 0940   CL 98 07/09/2014 0503   CO2 25 05/19/2023 0940   CO2 30 07/09/2014 0503   GLUCOSE 111 (H) 05/19/2023 0940   GLUCOSE 107 (H) 07/09/2014 0503   BUN 59 (H) 05/19/2023 0940   BUN 39 (H) 07/09/2014 0503   CREATININE 8.16 (H) 05/19/2023 0940   CREATININE 5.53 (H) 07/09/2014 0503   CALCIUM 9.5 05/19/2023 0940   CALCIUM 8.3 (L) 07/09/2014 0503   GFRNONAA 5 (L) 05/19/2023 0940   GFRNONAA 9 (L) 07/09/2014 0503   GFRNONAA 9 (L) 02/11/2014 0300   GFRAA 6 (L) 02/06/2020 0708   GFRAA 10 (L) 07/09/2014 0503   GFRAA 10 (L) 02/11/2014 0300   CrCl cannot be calculated (Patient's most recent lab result is older than the maximum 21 days  allowed.).  COAG Lab Results  Component Value Date   INR 1.6 (H) 05/17/2023   INR 1.6 (H) 02/16/2022   INR 1.6 (H) 08/07/2021    Radiology No results found.   Assessment/Plan ESRD on hemodialysis [facility]) Recommend:  The patient is doing  well and currently has adequate dialysis access. The patient's dialysis center is not reporting any access issues. Given this she no longer needs a permacath and will be removed to prevent septic complications.  Risks and benefits are reviewed all questions answered patient has agreed to proceed.  The patient should have a duplex ultrasound of the dialysis access in 3 months. The patient will follow-up with Dr Wyn Quaker in the office after each ultrasound     Chronic combined systolic and diastolic CHF (congestive heart failure) (HCC) Managed medically.  Follows with cardiology.   Hypertension blood pressure control important in reducing the progression of atherosclerotic disease. On appropriate oral medications.     Levora Dredge, MD  07/06/2023 2:17 PM

## 2023-07-06 NOTE — Assessment & Plan Note (Signed)
Sending referral to Ortho for her, as this has worsened since her last appointment.

## 2023-07-07 ENCOUNTER — Encounter: Payer: Self-pay | Admitting: Vascular Surgery

## 2023-07-07 DIAGNOSIS — N186 End stage renal disease: Secondary | ICD-10-CM | POA: Diagnosis not present

## 2023-07-07 DIAGNOSIS — N2581 Secondary hyperparathyroidism of renal origin: Secondary | ICD-10-CM | POA: Diagnosis not present

## 2023-07-07 DIAGNOSIS — Z992 Dependence on renal dialysis: Secondary | ICD-10-CM | POA: Diagnosis not present

## 2023-07-08 ENCOUNTER — Ambulatory Visit: Payer: 59 | Admitting: Family

## 2023-07-08 DIAGNOSIS — M189 Osteoarthritis of first carpometacarpal joint, unspecified: Secondary | ICD-10-CM | POA: Diagnosis not present

## 2023-07-08 DIAGNOSIS — M1811 Unilateral primary osteoarthritis of first carpometacarpal joint, right hand: Secondary | ICD-10-CM | POA: Diagnosis not present

## 2023-07-09 DIAGNOSIS — N2581 Secondary hyperparathyroidism of renal origin: Secondary | ICD-10-CM | POA: Diagnosis not present

## 2023-07-09 DIAGNOSIS — Z992 Dependence on renal dialysis: Secondary | ICD-10-CM | POA: Diagnosis not present

## 2023-07-09 DIAGNOSIS — N186 End stage renal disease: Secondary | ICD-10-CM | POA: Diagnosis not present

## 2023-07-12 DIAGNOSIS — Z992 Dependence on renal dialysis: Secondary | ICD-10-CM | POA: Diagnosis not present

## 2023-07-12 DIAGNOSIS — N186 End stage renal disease: Secondary | ICD-10-CM | POA: Diagnosis not present

## 2023-07-12 DIAGNOSIS — N2581 Secondary hyperparathyroidism of renal origin: Secondary | ICD-10-CM | POA: Diagnosis not present

## 2023-07-14 DIAGNOSIS — N2581 Secondary hyperparathyroidism of renal origin: Secondary | ICD-10-CM | POA: Diagnosis not present

## 2023-07-14 DIAGNOSIS — N186 End stage renal disease: Secondary | ICD-10-CM | POA: Diagnosis not present

## 2023-07-14 DIAGNOSIS — Z992 Dependence on renal dialysis: Secondary | ICD-10-CM | POA: Diagnosis not present

## 2023-07-15 ENCOUNTER — Encounter: Payer: Self-pay | Admitting: Cardiovascular Disease

## 2023-07-15 ENCOUNTER — Ambulatory Visit: Payer: 59 | Admitting: Cardiovascular Disease

## 2023-07-15 VITALS — BP 126/71 | HR 99 | Ht 66.0 in | Wt 151.0 lb

## 2023-07-15 DIAGNOSIS — I970 Postcardiotomy syndrome: Secondary | ICD-10-CM

## 2023-07-15 DIAGNOSIS — I3139 Other pericardial effusion (noninflammatory): Secondary | ICD-10-CM

## 2023-07-15 DIAGNOSIS — I739 Peripheral vascular disease, unspecified: Secondary | ICD-10-CM | POA: Diagnosis not present

## 2023-07-15 DIAGNOSIS — I9589 Other hypotension: Secondary | ICD-10-CM

## 2023-07-15 DIAGNOSIS — Z951 Presence of aortocoronary bypass graft: Secondary | ICD-10-CM

## 2023-07-15 DIAGNOSIS — I5042 Chronic combined systolic (congestive) and diastolic (congestive) heart failure: Secondary | ICD-10-CM

## 2023-07-15 DIAGNOSIS — I480 Paroxysmal atrial fibrillation: Secondary | ICD-10-CM | POA: Diagnosis not present

## 2023-07-15 DIAGNOSIS — I5020 Unspecified systolic (congestive) heart failure: Secondary | ICD-10-CM

## 2023-07-15 MED ORDER — LOSARTAN POTASSIUM 25 MG PO TABS: ORAL_TABLET | ORAL | 1 refills | Status: DC

## 2023-07-15 MED ORDER — ENALAPRIL MALEATE 2.5 MG PO TABS: 2.5000 mg | ORAL_TABLET | Freq: Every day | ORAL | 11 refills | Status: DC

## 2023-07-15 NOTE — Progress Notes (Signed)
Cardiology Office Note   Date:  07/15/2023   ID:  [name], DOB 31-Aug-1960, MRN [id number]  PCP:  Miki Kins, FNP  Cardiologist:  Adrian Blackwater, MD      History of Present Illness: [name] is a 63 y.o. female who presents for  Chief Complaint  Patient presents with   Follow-up    6 week follow up    Feeling good      Past Medical History:  Diagnosis Date   Adult behavior problems    frontal lobe CVA   Anemia    chronic disease   Atrial flutter by electrocardiogram (HCC) 03/22/2014   Cardiomyopathy (HCC)    CHF (congestive heart failure) (HCC)    Chicken pox    Chronic respiratory failure with hypoxia (HCC) 12/19/2021   Coronary artery disease    Delayed surgical wound healing 04/12/2014   Overview:  Left leg   GERD (gastroesophageal reflux disease)    H/O atrial flutter 04/12/2014   H/O stroke without residual deficits 02/13/2014   H/O: substance abuse (HCC) 02/27/2014   Heart failure (HCC)    Hepatitis    history of hep c   History of CVA (cerebrovascular accident) 02/21/2014   History of delirium 04/12/2014   Hyperkalemia 11/13/2014   Hyperlipidemia    Hypertension    Hypotension 02/24/2014   Leukocytosis 02/26/2014   Myocardial infarction [facility])    Obesity    Paroxysmal atrial fibrillation (HCC)    Peripheral vascular disease (HCC)    Postoperative anemia due to acute blood loss 02/27/2014   Renal failure    Renal insufficiency    Stroke [facility]) 2011   Superficial incisional surgical site infection 03/14/2014   Syncope and collapse 07/25/2020     Past Surgical History:  Procedure Laterality Date   A/V FISTULAGRAM Left 03/30/2019   Procedure: A/V FISTULAGRAM;  Surgeon: Annice Needy, MD;  Location: [facility] INVASIVE CV LAB;  Service: Cardiovascular;  Laterality: Left;   A/V FISTULAGRAM Left 10/06/2019   Procedure: A/V FISTULAGRAM;  Surgeon: Annice Needy, MD;  Location: [facility] INVASIVE CV LAB;  Service: Cardiovascular;   Laterality: Left;   A/V FISTULAGRAM Left 05/19/2021   Procedure: A/V FISTULAGRAM;  Surgeon: Annice Needy, MD;  Location: [facility] INVASIVE CV LAB;  Service: Cardiovascular;  Laterality: Left;   A/V FISTULAGRAM Left 01/29/2022   Procedure: A/V Fistulagram;  Surgeon: Annice Needy, MD;  Location: [facility] INVASIVE CV LAB;  Service: Cardiovascular;  Laterality: Left;   A/V FISTULAGRAM Left 04/30/2022   Procedure: A/V Fistulagram;  Surgeon: Annice Needy, MD;  Location: [facility] INVASIVE CV LAB;  Service: Cardiovascular;  Laterality: Left;   A/V FISTULAGRAM Left 08/20/2022   Procedure: A/V Fistulagram;  Surgeon: Annice Needy, MD;  Location: [facility] INVASIVE CV LAB;  Service: Cardiovascular;  Laterality: Left;   A/V FISTULAGRAM N/A 12/03/2022   Procedure: A/V Fistulagram;  Surgeon: Annice Needy, MD;  Location: [facility] INVASIVE CV LAB;  Service: Cardiovascular;  Laterality: N/A;   A/V FISTULAGRAM Left 02/11/2023   Procedure: A/V Fistulagram;  Surgeon: Annice Needy, MD;  Location: [facility] INVASIVE CV LAB;  Service: Cardiovascular;  Laterality: Left;   COLONOSCOPY     COLONOSCOPY WITH PROPOFOL N/A 04/22/2021   Procedure: COLONOSCOPY WITH PROPOFOL;  Surgeon: Regis Bill, MD;  Location: [facility] ENDOSCOPY;  Service: Endoscopy;  Laterality: N/A;   CORONARY ANGIOPLASTY WITH STENT PLACEMENT  2013   CORONARY ARTERY BYPASS GRAFT     DIALYSIS FISTULA  CREATION     DIALYSIS/PERMA CATHETER INSERTION N/A 04/08/2023   Procedure: DIALYSIS/PERMA CATHETER INSERTION;  Surgeon: Annice Needy, MD;  Location: [facility] INVASIVE CV LAB;  Service: Cardiovascular;  Laterality: N/A;   DIALYSIS/PERMA CATHETER REMOVAL N/A 07/06/2023   Procedure: DIALYSIS/PERMA CATHETER REMOVAL;  Surgeon: Renford Dills, MD;  Location: [facility] INVASIVE CV LAB;  Service: Cardiovascular;  Laterality: N/A;   ESOPHAGOGASTRODUODENOSCOPY     ESOPHAGOGASTRODUODENOSCOPY N/A 04/22/2021   Procedure: ESOPHAGOGASTRODUODENOSCOPY (EGD);  Surgeon: Regis Bill, MD;  Location: [facility]  ENDOSCOPY;  Service: Endoscopy;  Laterality: N/A;   FLEXIBLE SIGMOIDOSCOPY N/A 02/23/2019   Procedure: FLEXIBLE SIGMOIDOSCOPY;  Surgeon: Christena Deem, MD;  Location: [facility] ENDOSCOPY;  Service: Endoscopy;  Laterality: N/A;   LEFT HEART CATH AND CORS/GRAFTS ANGIOGRAPHY N/A 08/08/2018   Procedure: LEFT HEART CATH AND CORS/GRAFTS ANGIOGRAPHY;  Surgeon: Laurier Nancy, MD;  Location: [facility] INVASIVE CV LAB;  Service: Cardiovascular;  Laterality: N/A;   LEFT HEART CATH AND CORS/GRAFTS ANGIOGRAPHY N/A 01/30/2019   Procedure: LEFT HEART CATH AND CORS/GRAFTS ANGIOGRAPHY;  Surgeon: Laurier Nancy, MD;  Location: [facility] INVASIVE CV LAB;  Service: Cardiovascular;  Laterality: N/A;   percutaneous insertion intra aortic balloon right cath     ultrasound guided pericardiocentesis       Current Outpatient Medications  Medication Sig Dispense Refill   albuterol (VENTOLIN HFA) 108 (90 Base) MCG/ACT inhaler Inhale 1-2 puffs into the lungs every 6 (six) hours as needed for wheezing or shortness of breath. 20.1 g 3   apixaban (ELIQUIS) 5 MG TABS tablet Take 5 mg by mouth 2 (two) times daily.     Bismuth Tribromoph-Petrolatum (XEROFORM PETROLAT PATCH 2"X2") PADS Apply 1 each topically every 3 (three) days. 25 each 0   hydrOXYzine (ATARAX/VISTARIL) 50 MG tablet Take 50 mg by mouth at bedtime.     levothyroxine (SYNTHROID) 75 MCG tablet Take 75 mcg by mouth daily before breakfast.      losartan (COZAAR) 25 MG tablet Take 1/2 tab daily 90 tablet 1   midodrine (PROAMATINE) 10 MG tablet Take 1 tablet (10 mg total) by mouth 3 (three) times daily. Monday, Wednesday, Friday with hemodialysis.   Take 1 tablet once daily on non-dialysis days. 90 tablet 0   omeprazole (PRILOSEC) 20 MG capsule Take 20 mg by mouth daily.     oxyCODONE-acetaminophen (PERCOCET/ROXICET) 5-325 MG tablet Take 1 tablet by mouth every 6 (six) hours as needed for severe pain (pain score 7-10). 20 tablet 0   ranolazine (RANEXA) 1000 MG SR tablet Take  1,000 mg by mouth 2 (two) times daily.     rosuvastatin (CRESTOR) 20 MG tablet Take 20 mg by mouth at bedtime.     sevelamer carbonate (RENVELA) 800 MG tablet Take 3,200 mg by mouth 3 (three) times daily with meals.     VELTASSA 8.4 g packet Take 1 packet by mouth daily.     No current facility-administered medications for this visit.   Facility-Administered Medications Ordered in Other Visits  Medication Dose Route Frequency Provider Last Rate Last Admin   heparin injection    PRN Annice Needy, MD   10,000 Units at 04/08/23 1412    Allergies:   Morphine and codeine, Levaquin [levofloxacin], Enalapril, Latex, and Tape    Social History:   reports that she has been smoking cigarettes. She has a 5 pack-year smoking history. She has never used smokeless tobacco. She reports that she does not drink alcohol and does not use drugs.   Family  History:  family history includes Breast cancer in her maternal aunt and maternal grandmother; Cancer in an other family member; Diabetes type II in her maternal grandmother and mother; Hypertension in her father, mother, sister, and another family member; Kidney disease in her father; Ovarian cancer in her mother; Renal Disease in an other family member.    ROS:     Review of Systems  Constitutional: Negative.   HENT: Negative.    Eyes: Negative.   Respiratory: Negative.    Gastrointestinal: Negative.   Genitourinary: Negative.   Musculoskeletal: Negative.   Skin: Negative.   Neurological: Negative.   Endo/Heme/Allergies: Negative.   Psychiatric/Behavioral: Negative.    All other systems reviewed and are negative.     All other systems are reviewed and negative.    PHYSICAL EXAM: VS:  BP 126/71   Pulse 99   Ht 5\' 6"  (1.676 m)   Wt 151 lb (68.5 kg)   SpO2 95%   BMI 24.37 kg/m  , BMI Body mass index is 24.37 kg/m. Last weight:  Wt Readings from Last 3 Encounters:  07/15/23 151 lb (68.5 kg)  07/06/23 158 lb (71.7 kg)  06/22/23 159 lb  (72.1 kg)     Physical Exam Constitutional:      Appearance: Normal appearance.  Cardiovascular:     Rate and Rhythm: Normal rate and regular rhythm.     Heart sounds: Normal heart sounds.  Pulmonary:     Effort: Pulmonary effort is normal.     Breath sounds: Normal breath sounds.  Musculoskeletal:     Right lower leg: No edema.     Left lower leg: No edema.  Neurological:     Mental Status: She is alert.       EKG:   Recent Labs: 05/17/2023: ALT 16 05/19/2023: BUN 59; Creatinine, Ser 8.16; Hemoglobin 11.0; Platelets 128; Potassium 4.7; Sodium 134    Lipid Panel    Component Value Date/Time   CHOL 140 01/18/2019 1358   CHOL 79 06/14/2014 0404   TRIG 148 01/18/2019 1358   TRIG 85 06/14/2014 0404   HDL 43 01/18/2019 1358   HDL 35 (L) 06/14/2014 0404   CHOLHDL 3.3 01/18/2019 1358   VLDL 30 01/18/2019 1358   VLDL 17 06/14/2014 0404   LDLCALC 67 01/18/2019 1358   LDLCALC 27 06/14/2014 0404      Other studies Reviewed: Additional studies/ records that were reviewed today include:  Review of the above records demonstrates:       No data to display            ASSESSMENT AND PLAN:    ICD-10-CM   1. Chronic hypotension  I95.89 DISCONTINUED: enalapril (VASOTEC) 2.5 MG tablet    2. Post pericardiotomy syndrome  I97.0 DISCONTINUED: enalapril (VASOTEC) 2.5 MG tablet    3. Pericardial effusion  I31.39 DISCONTINUED: enalapril (VASOTEC) 2.5 MG tablet    4. HFrEF (heart failure with reduced ejection fraction) (HCC)  I50.20 DISCONTINUED: enalapril (VASOTEC) 2.5 MG tablet    5. Paroxysmal atrial fibrillation (HCC)  I48.0 DISCONTINUED: enalapril (VASOTEC) 2.5 MG tablet    6. Chronic combined systolic and diastolic CHF (congestive heart failure) (HCC)  I50.42 DISCONTINUED: enalapril (VASOTEC) 2.5 MG tablet   add 12.5 losarten    7. Peripheral vascular disease of lower extremity (HCC)  I73.9 DISCONTINUED: enalapril (VASOTEC) 2.5 MG tablet    8. S/P CABG x 3  Z95.1  DISCONTINUED: enalapril (VASOTEC) 2.5 MG tablet       Problem  List Items Addressed This Visit       Cardiovascular and Mediastinum   Chronic hypotension - Primary (Chronic)   Relevant Medications   losartan (COZAAR) 25 MG tablet   HFrEF (heart failure with reduced ejection fraction) (HCC) (Chronic)   Relevant Medications   losartan (COZAAR) 25 MG tablet   Chronic combined systolic and diastolic CHF (congestive heart failure) (HCC) (Chronic)   Relevant Medications   losartan (COZAAR) 25 MG tablet   Post pericardiotomy syndrome   Relevant Medications   losartan (COZAAR) 25 MG tablet   Pericardial effusion   Relevant Medications   losartan (COZAAR) 25 MG tablet   Paroxysmal atrial fibrillation (HCC)   Relevant Medications   losartan (COZAAR) 25 MG tablet   Peripheral vascular disease of lower extremity (HCC)   Relevant Medications   losartan (COZAAR) 25 MG tablet     Other   S/P CABG x 3       Disposition:   Return in about 2 months (around 09/12/2023).    Total time spent: 30 minutes  Signed,  Adrian Blackwater, MD  07/15/2023 9:59 AM    [facility]

## 2023-07-16 DIAGNOSIS — N186 End stage renal disease: Secondary | ICD-10-CM | POA: Diagnosis not present

## 2023-07-16 DIAGNOSIS — N2581 Secondary hyperparathyroidism of renal origin: Secondary | ICD-10-CM | POA: Diagnosis not present

## 2023-07-16 DIAGNOSIS — Z992 Dependence on renal dialysis: Secondary | ICD-10-CM | POA: Diagnosis not present

## 2023-07-17 DIAGNOSIS — Z992 Dependence on renal dialysis: Secondary | ICD-10-CM | POA: Diagnosis not present

## 2023-07-17 DIAGNOSIS — N186 End stage renal disease: Secondary | ICD-10-CM | POA: Diagnosis not present

## 2023-07-17 DIAGNOSIS — N2581 Secondary hyperparathyroidism of renal origin: Secondary | ICD-10-CM | POA: Diagnosis not present

## 2023-07-19 DIAGNOSIS — N186 End stage renal disease: Secondary | ICD-10-CM | POA: Diagnosis not present

## 2023-07-19 DIAGNOSIS — N2581 Secondary hyperparathyroidism of renal origin: Secondary | ICD-10-CM | POA: Diagnosis not present

## 2023-07-19 DIAGNOSIS — Z992 Dependence on renal dialysis: Secondary | ICD-10-CM | POA: Diagnosis not present

## 2023-07-20 ENCOUNTER — Ambulatory Visit: Payer: 59

## 2023-07-20 DIAGNOSIS — G4452 New daily persistent headache (NDPH): Secondary | ICD-10-CM

## 2023-07-21 ENCOUNTER — Telehealth (INDEPENDENT_AMBULATORY_CARE_PROVIDER_SITE_OTHER): Payer: Self-pay

## 2023-07-21 DIAGNOSIS — Z992 Dependence on renal dialysis: Secondary | ICD-10-CM | POA: Diagnosis not present

## 2023-07-21 DIAGNOSIS — N2581 Secondary hyperparathyroidism of renal origin: Secondary | ICD-10-CM | POA: Diagnosis not present

## 2023-07-21 DIAGNOSIS — N186 End stage renal disease: Secondary | ICD-10-CM | POA: Diagnosis not present

## 2023-07-21 NOTE — Telephone Encounter (Signed)
Patient left a message stating that she has big knot, redness,throbbing, and swelling at perm cath removal site.  Patient informed that symptoms started few days ago. Patient perm cath removal was 07/06/23. Please Advise

## 2023-07-21 NOTE — Telephone Encounter (Signed)
We can bring her in to take a look ..me or jd

## 2023-07-22 IMAGING — CR DG CHEST 2V
1 series · 2 of 2 positions shown · non-contrast
Comparison: Radiograph 03/11/2021

CLINICAL DATA: Chest pain

EXAM:
CHEST - 2 VIEW

[Series 1: w chest pa · 0.14mm/px · 2 of 2 slices shown]
[im 1/2]
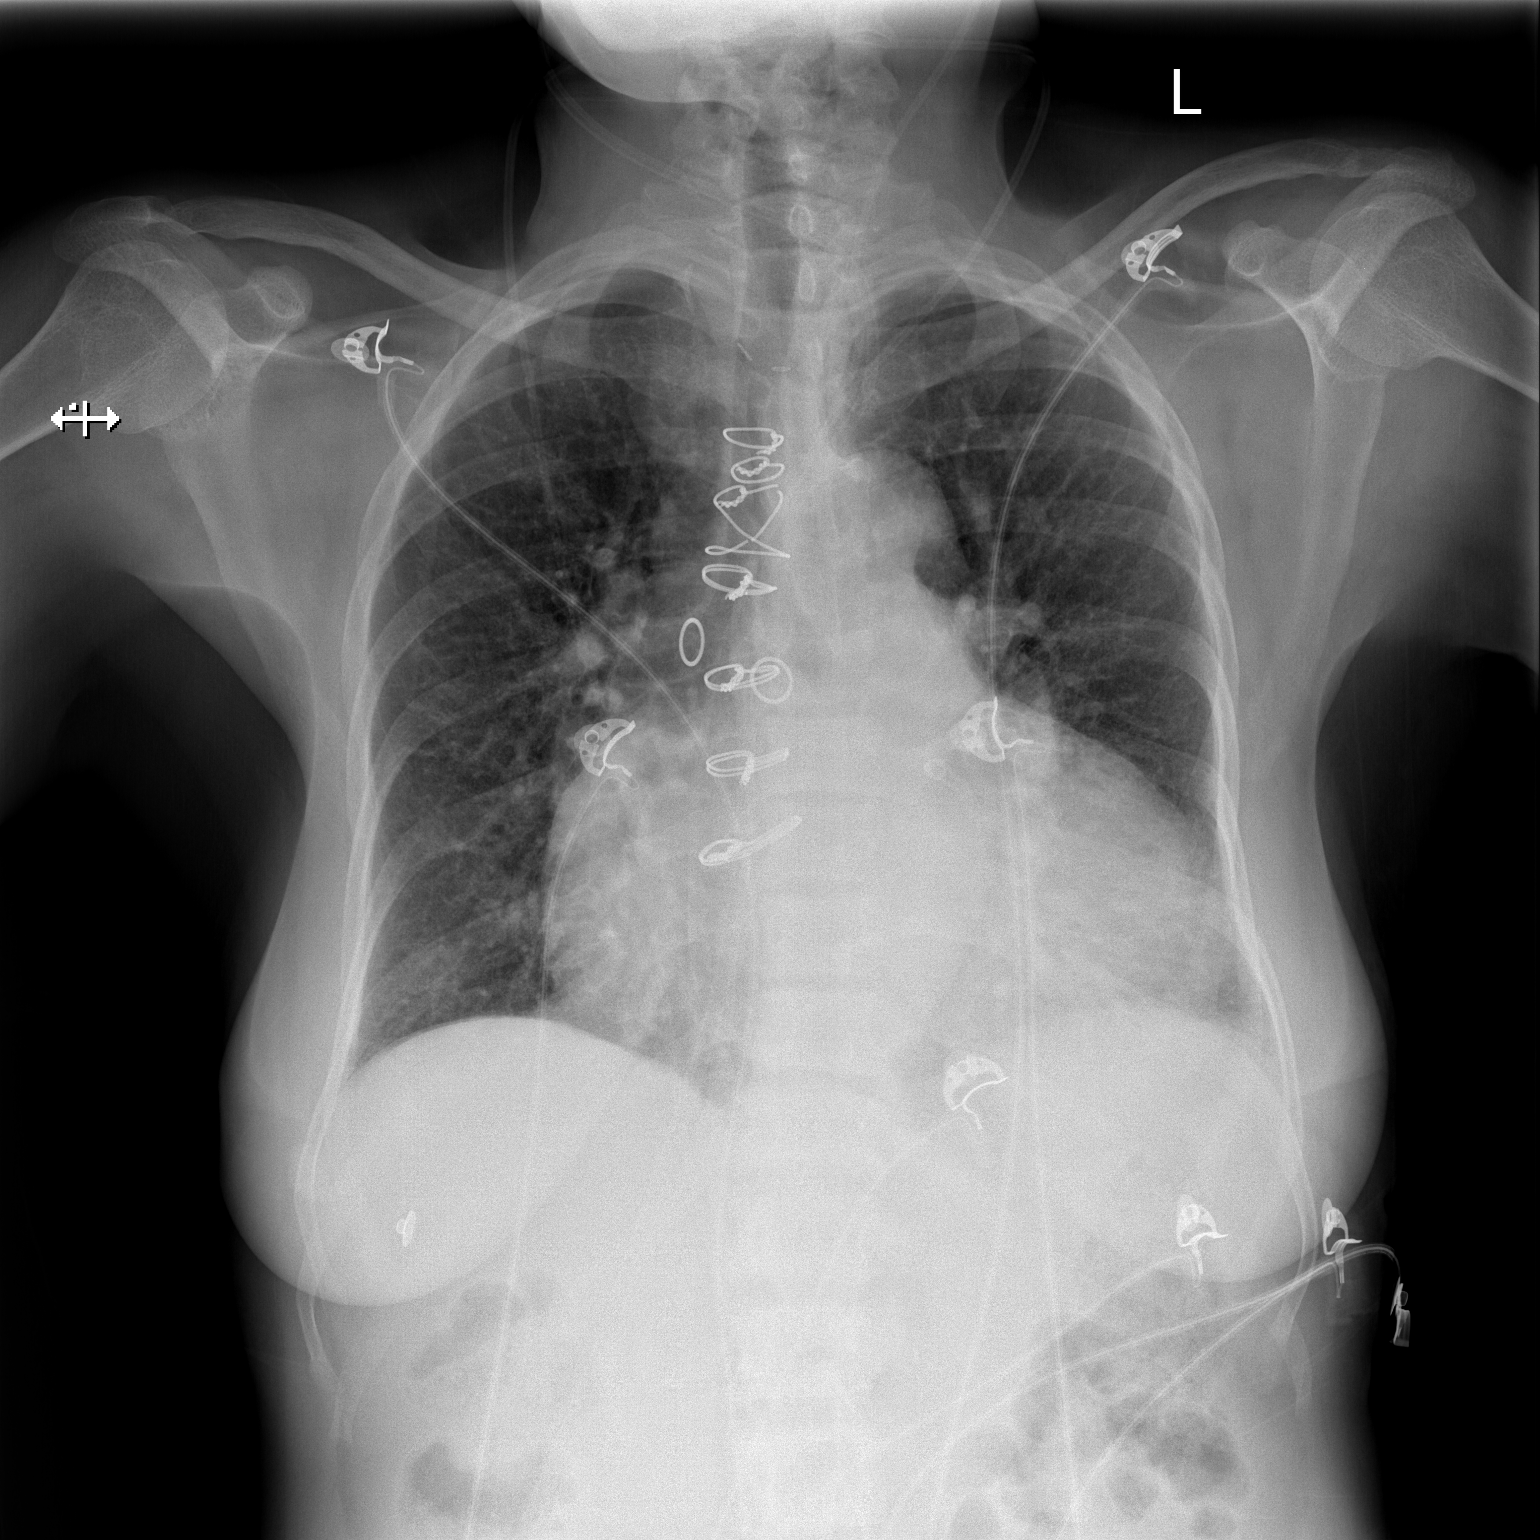
[im 2/2]
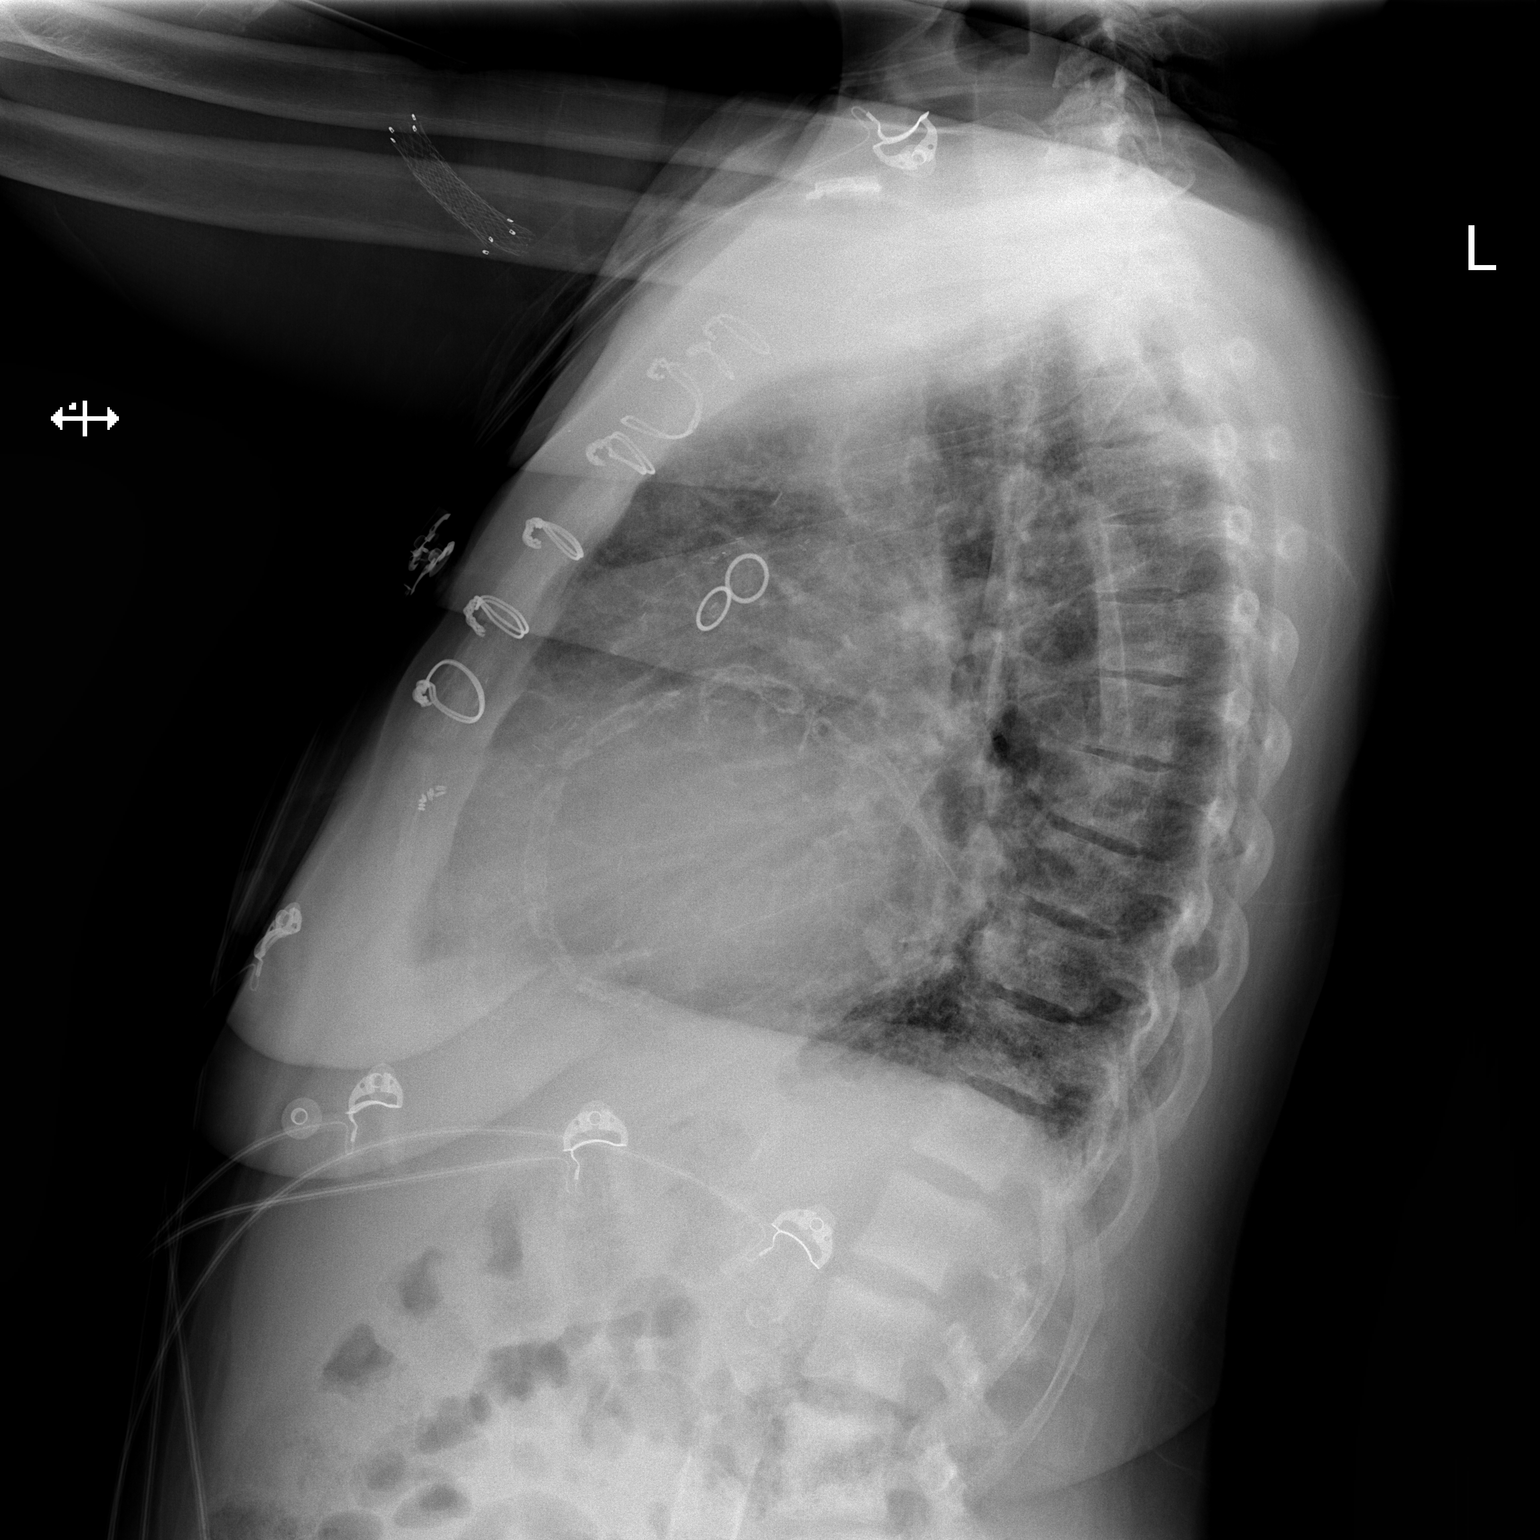

[2 of 2 positions shown; findings below may reference images not displayed]

FINDINGS: Cardiomegaly. Prior median sternotomy CABG. There is no focal
airspace consolidation. Mild central pulmonary vascular congestion.
There is no large pleural effusion. There is no visible
pneumothorax. No acute osseous abnormality.
IMPRESSION: Cardiomegaly with central pulmonary vascular congestion.

## 2023-07-22 IMAGING — US US ABDOMEN LIMITED
1 series · 14 of 25 positions shown · non-contrast
Comparison: CT scan 02/09/2021

CLINICAL DATA: Right upper quadrant abdominal pain for 2 days.

EXAM:
ULTRASOUND ABDOMEN LIMITED RIGHT UPPER QUADRANT

[Series 1: us abdomen limited ruq (liver/gb) · 14 of 58 slices shown]
[im 1/58]
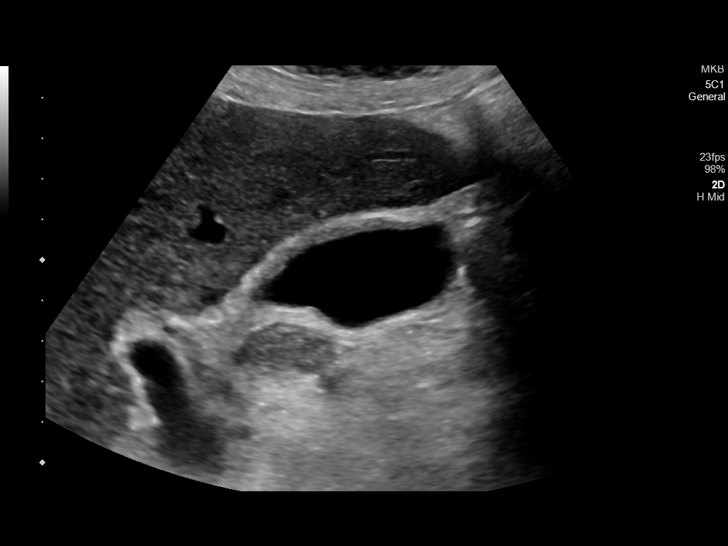
[im 5/58]
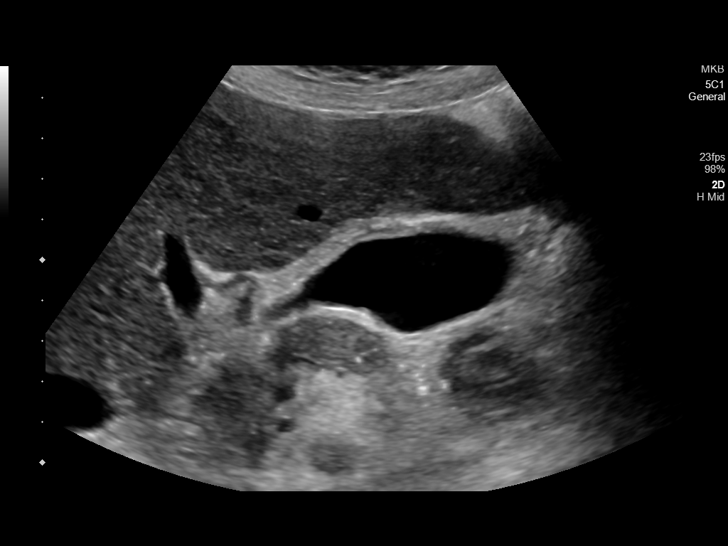
[im 10/58]
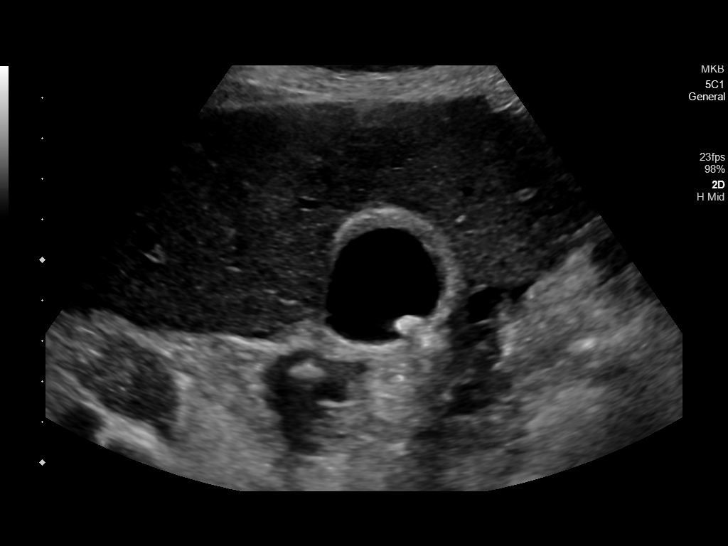
[im 15/58]
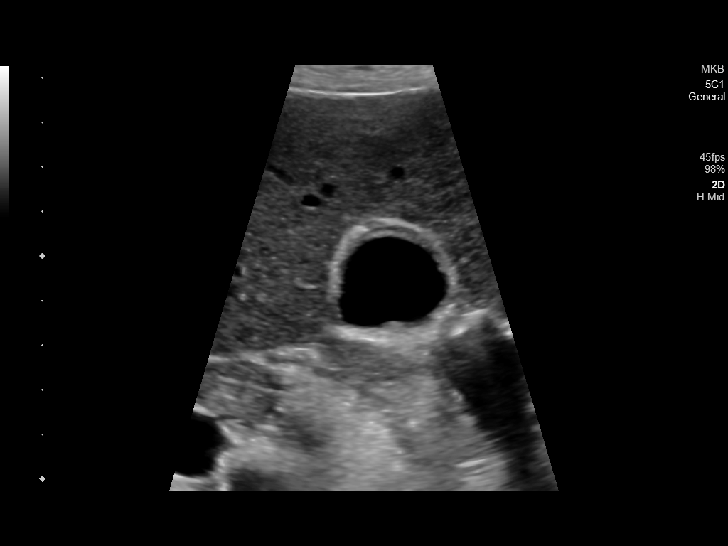
[im 20/58]
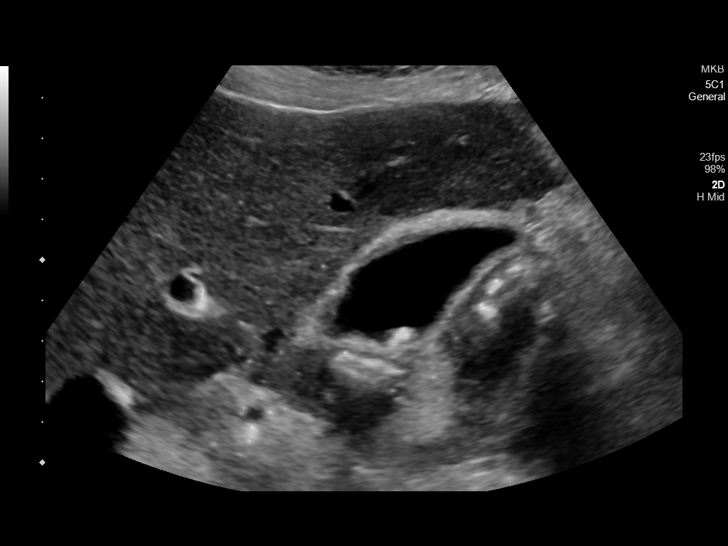
[im 22/58]
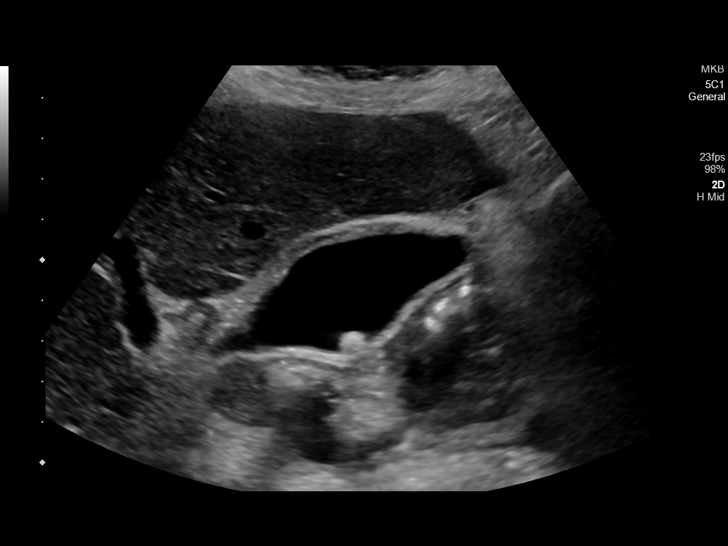
[im 27/58]
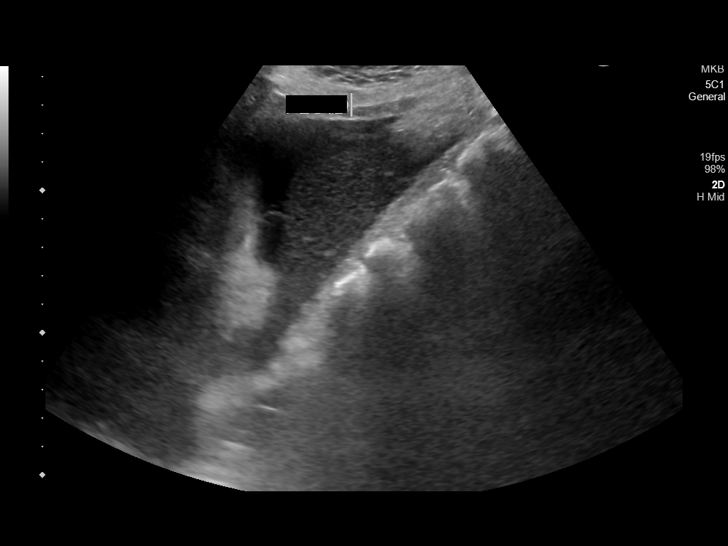
[im 31/58]
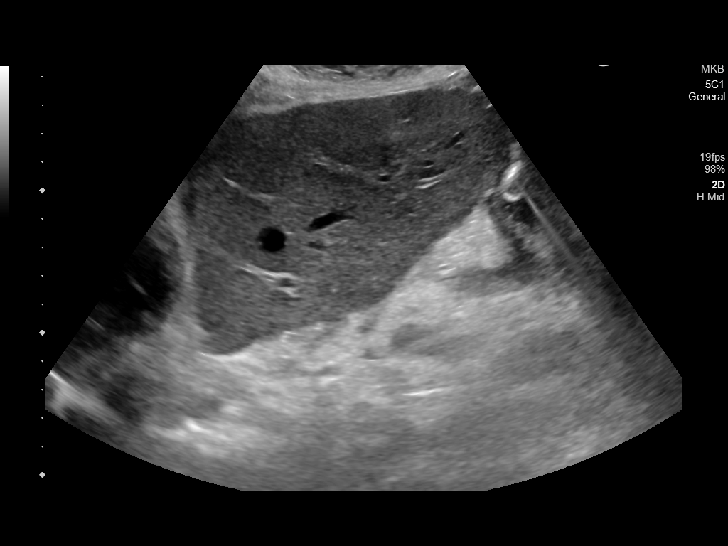
[im 36/58]
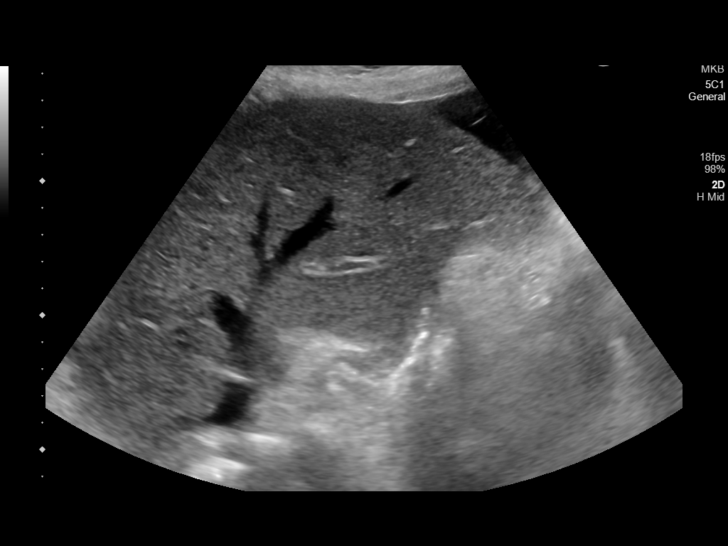
[im 39/58]
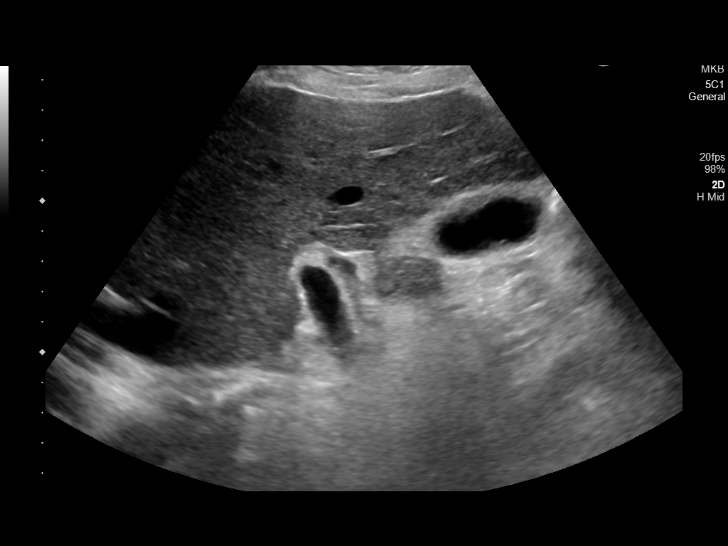
[im 43/58]
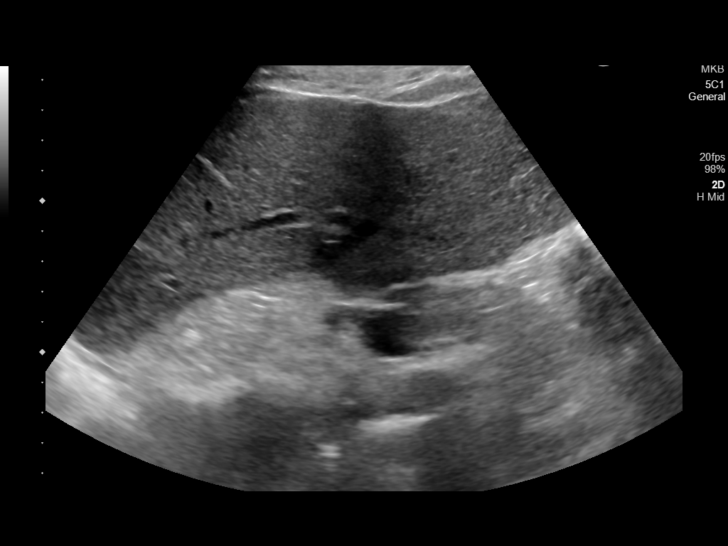
[im 48/58]
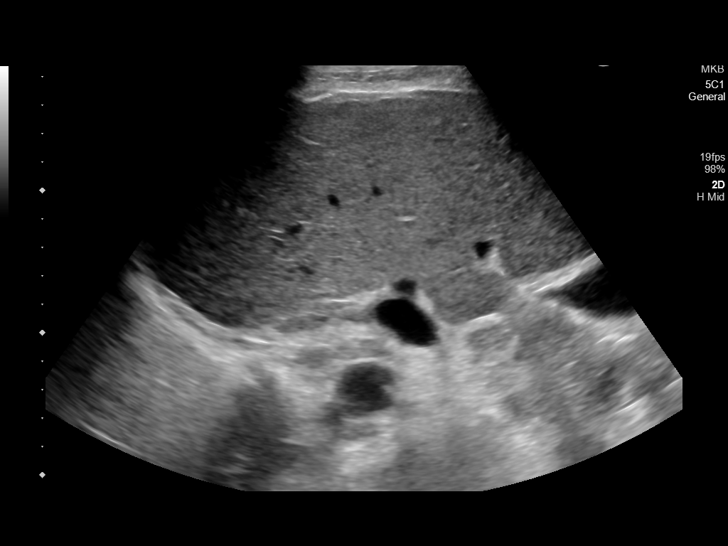
[im 53/58]
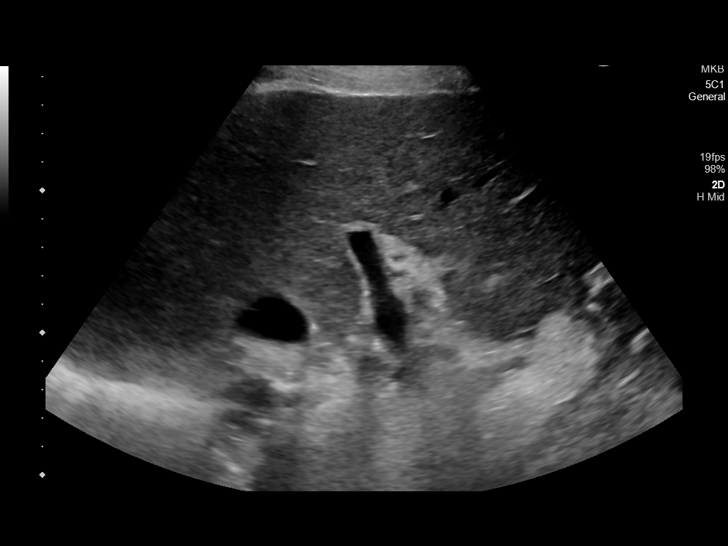
[im 58/58]
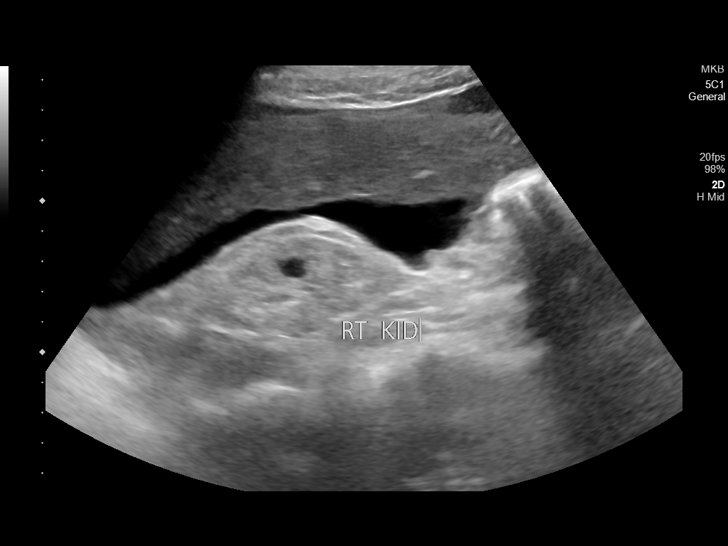

[14 of 25 positions shown; findings below may reference images not displayed]

FINDINGS: Gallbladder:

Single 7 mm mobile gallstone in the gallbladder. Moderate
gallbladder wall thickening measuring a maximum of 4.7 mm. Small
amount of pericholecystic fluid also noted. No sonographic Murphy
sign elicited.

Common bile duct:

Diameter: 5.4 mm

Liver:

No hepatic lesions or intrahepatic biliary dilatation. Perihepatic
fluid is noted. Portal vein is patent on color Doppler imaging with
normal direction of blood flow towards the liver.

Other: Echogenic right kidney consistent with known renal failure.
IMPRESSION: 1. Single multiple echogenic gallstone in the gallbladder.
2. Gallbladder wall thickening pericholecystic fluid/ascites.
3. Normal caliber common bile duct and no intrahepatic biliary
dilatation.

## 2023-07-23 DIAGNOSIS — N186 End stage renal disease: Secondary | ICD-10-CM | POA: Diagnosis not present

## 2023-07-23 DIAGNOSIS — Z992 Dependence on renal dialysis: Secondary | ICD-10-CM | POA: Diagnosis not present

## 2023-07-23 DIAGNOSIS — N2581 Secondary hyperparathyroidism of renal origin: Secondary | ICD-10-CM | POA: Diagnosis not present

## 2023-07-26 DIAGNOSIS — N186 End stage renal disease: Secondary | ICD-10-CM | POA: Diagnosis not present

## 2023-07-26 DIAGNOSIS — Z992 Dependence on renal dialysis: Secondary | ICD-10-CM | POA: Diagnosis not present

## 2023-07-26 DIAGNOSIS — N2581 Secondary hyperparathyroidism of renal origin: Secondary | ICD-10-CM | POA: Diagnosis not present

## 2023-07-27 ENCOUNTER — Ambulatory Visit (INDEPENDENT_AMBULATORY_CARE_PROVIDER_SITE_OTHER): Payer: 59 | Admitting: Family

## 2023-07-27 ENCOUNTER — Encounter: Payer: Self-pay | Admitting: Family

## 2023-07-27 ENCOUNTER — Ambulatory Visit (INDEPENDENT_AMBULATORY_CARE_PROVIDER_SITE_OTHER): Payer: 59 | Admitting: Nurse Practitioner

## 2023-07-27 ENCOUNTER — Encounter (INDEPENDENT_AMBULATORY_CARE_PROVIDER_SITE_OTHER): Payer: Self-pay | Admitting: Nurse Practitioner

## 2023-07-27 VITALS — BP 121/83 | HR 96 | Resp 18 | Ht 66.0 in | Wt 157.4 lb

## 2023-07-27 VITALS — BP 100/64 | Ht 66.0 in | Wt 157.6 lb

## 2023-07-27 DIAGNOSIS — G4452 New daily persistent headache (NDPH): Secondary | ICD-10-CM

## 2023-07-27 DIAGNOSIS — I9589 Other hypotension: Secondary | ICD-10-CM

## 2023-07-27 DIAGNOSIS — N186 End stage renal disease: Secondary | ICD-10-CM | POA: Diagnosis not present

## 2023-07-27 DIAGNOSIS — Z992 Dependence on renal dialysis: Secondary | ICD-10-CM | POA: Diagnosis not present

## 2023-07-27 DIAGNOSIS — I10 Essential (primary) hypertension: Secondary | ICD-10-CM | POA: Diagnosis not present

## 2023-07-27 DIAGNOSIS — I509 Heart failure, unspecified: Secondary | ICD-10-CM | POA: Diagnosis not present

## 2023-07-27 MED ORDER — OXYCODONE-ACETAMINOPHEN 5-325 MG PO TABS: 1.0000 | ORAL_TABLET | Freq: Four times a day (QID) | ORAL | 0 refills | Status: DC | PRN

## 2023-07-27 MED ORDER — DOXYCYCLINE HYCLATE 100 MG PO CAPS: 100.0000 mg | ORAL_CAPSULE | Freq: Two times a day (BID) | ORAL | 0 refills | Status: DC

## 2023-07-27 NOTE — Progress Notes (Signed)
 Established Patient Office Visit  Subjective:  Patient ID: [name], female    DOB: 1961/01/17  Age: 63 y.o. MRN: [id number]  Chief Complaint  Patient presents with   Follow-up    CT follow up    Patient is here today for her follow-up after CT scan. CT scan was done due to consistent headaches since recent hospitalization. Showed areas of possible ischemic infarction, MRI was suggested as follow-up imaging to further evaluate these lesions.  Since last visit patient has also had her PermCath removed but she got a subsequent infection in the site.  She says that this is quite painful and it is swollen today, though it has improved significantly as it recently drained. She does have a follow-up today with vascular.    No other concerns at this time.   Past Medical History:  Diagnosis Date   Adult behavior problems    frontal lobe CVA   Anemia    chronic disease   Atrial flutter by electrocardiogram (HCC) 03/22/2014   Cardiomyopathy (HCC)    CHF (congestive heart failure) (HCC)    Chicken pox    Chronic respiratory failure with hypoxia (HCC) 12/19/2021   Coronary artery disease    Delayed surgical wound healing 04/12/2014   Overview:  Left leg   GERD (gastroesophageal reflux disease)    H/O atrial flutter 04/12/2014   H/O stroke without residual deficits 02/13/2014   H/O: substance abuse (HCC) 02/27/2014   Heart failure (HCC)    Hepatitis    history of hep c   History of CVA (cerebrovascular accident) 02/21/2014   History of delirium 04/12/2014   Hyperkalemia 11/13/2014   Hyperlipidemia    Hypertension    Hypotension 02/24/2014   Leukocytosis 02/26/2014   Myocardial infarction [facility])    Obesity    Paroxysmal atrial fibrillation (HCC)    Peripheral vascular disease (HCC)    Postoperative anemia due to acute blood loss 02/27/2014   Renal failure    Renal insufficiency    Stroke [facility]) 2011   Superficial incisional surgical site infection 03/14/2014    Syncope and collapse 07/25/2020    Past Surgical History:  Procedure Laterality Date   A/V FISTULAGRAM Left 03/30/2019   Procedure: A/V FISTULAGRAM;  Surgeon: Celso College, MD;  Location: [facility] INVASIVE CV LAB;  Service: Cardiovascular;  Laterality: Left;   A/V FISTULAGRAM Left 10/06/2019   Procedure: A/V FISTULAGRAM;  Surgeon: Celso College, MD;  Location: [facility];  Service: Cardiovascular;  Laterality: Left;   A/V FISTULAGRAM Left 05/19/2021   Procedure: A/V FISTULAGRAM;  Surgeon: Celso College, MD;  Location: [facility];  Service: Cardiovascular;  Laterality: Left;   A/V FISTULAGRAM Left 01/29/2022   Procedure: A/V Fistulagram;  Surgeon: Celso College, MD;  Location: [facility];  Service: Cardiovascular;  Laterality: Left;   A/V FISTULAGRAM Left 04/30/2022   Procedure: A/V Fistulagram;  Surgeon: Celso College, MD;  Location: [facility];  Service: Cardiovascular;  Laterality: Left;   A/V FISTULAGRAM Left 08/20/2022   Procedure: A/V Fistulagram;  Surgeon: Celso College, MD;  Location: [facility];  Service: Cardiovascular;  Laterality: Left;   A/V FISTULAGRAM N/A 12/03/2022   Procedure: A/V Fistulagram;  Surgeon: Celso College, MD;  Location: [facility];  Service: Cardiovascular;  Laterality: N/A;   A/V FISTULAGRAM Left 02/11/2023   Procedure: A/V Fistulagram;  Surgeon: Celso College, MD;  Location: [facility];  Service: Cardiovascular;  Laterality: Left;   COLONOSCOPY     COLONOSCOPY WITH PROPOFOL N/A 04/22/2021   Procedure: COLONOSCOPY WITH PROPOFOL;  Surgeon: Shane Darling, MD;  Location: [facility] ENDOSCOPY;  Service: Endoscopy;  Laterality: N/A;   CORONARY ANGIOPLASTY WITH STENT PLACEMENT  2013   CORONARY ARTERY BYPASS GRAFT     DIALYSIS FISTULA CREATION     DIALYSIS/PERMA CATHETER INSERTION N/A 04/08/2023   Procedure: DIALYSIS/PERMA CATHETER INSERTION;  Surgeon: Celso College, MD;  Location: [facility];  Service:  Cardiovascular;  Laterality: N/A;   DIALYSIS/PERMA CATHETER REMOVAL N/A 07/06/2023   Procedure: DIALYSIS/PERMA CATHETER REMOVAL;  Surgeon: Jackquelyn Mass, MD;  Location: [facility];  Service: Cardiovascular;  Laterality: N/A;   ESOPHAGOGASTRODUODENOSCOPY     ESOPHAGOGASTRODUODENOSCOPY N/A 04/22/2021   Procedure: ESOPHAGOGASTRODUODENOSCOPY (EGD);  Surgeon: Shane Darling, MD;  Location: [facility] ENDOSCOPY;  Service: Endoscopy;  Laterality: N/A;   FLEXIBLE SIGMOIDOSCOPY N/A 02/23/2019   Procedure: FLEXIBLE SIGMOIDOSCOPY;  Surgeon: Deveron Fly, MD;  Location: [facility] ENDOSCOPY;  Service: Endoscopy;  Laterality: N/A;   LEFT HEART CATH AND CORS/GRAFTS ANGIOGRAPHY N/A 08/08/2018   Procedure: LEFT HEART CATH AND CORS/GRAFTS ANGIOGRAPHY;  Surgeon: Cherrie Cornwall, MD;  Location: [facility];  Service: Cardiovascular;  Laterality: N/A;   LEFT HEART CATH AND CORS/GRAFTS ANGIOGRAPHY N/A 01/30/2019   Procedure: LEFT HEART CATH AND CORS/GRAFTS ANGIOGRAPHY;  Surgeon: Cherrie Cornwall, MD;  Location: [facility];  Service: Cardiovascular;  Laterality: N/A;   percutaneous insertion intra aortic balloon right cath     ultrasound guided pericardiocentesis      Social History   Socioeconomic History   Marital status: Single    Spouse name: Not on file   Number of children: Not on file   Years of education: Not on file   Highest education level: Not on file  Occupational History   Not on file  Tobacco Use   Smoking status: Some Days    Current packs/day: 0.25    Average packs/day: 0.3 packs/day for 20.0 years (5.0 ttl pk-yrs)    Types: Cigarettes   Smokeless tobacco: Never   Tobacco comments:    smokes two cigarettes a day  Vaping Use   Vaping status: Never Used  Substance and Sexual Activity   Alcohol use: No   Drug use: No   Sexual activity: Not Currently    Birth control/protection: Post-menopausal  Other Topics Concern   Not on file  Social History  Narrative   Not on file   Social Drivers of Health   Financial Resource Strain: Not on file  Food Insecurity: No Food Insecurity (05/18/2023)   Hunger Vital Sign    Worried About Running Out of Food in the Last Year: Never true    Ran Out of Food in the Last Year: Never true  Transportation Needs: No Transportation Needs (05/18/2023)   PRAPARE - Administrator, Civil Service (Medical): No    Lack of Transportation (Non-Medical): No  Physical Activity: Not on file  Stress: Not on file  Social Connections: Not on file  Intimate Partner Violence: Not At Risk (05/18/2023)   Humiliation, Afraid, Rape, and Kick questionnaire    Fear of Current or Ex-Partner: No    Emotionally Abused: No    Physically Abused: No    Sexually Abused: No    Family History  Problem Relation Age of Onset   Hypertension Mother    Ovarian cancer Mother  Diabetes type II Mother    Hypertension Father    Kidney disease Father    Hypertension Sister    Diabetes type II Maternal Grandmother    Breast cancer Maternal Grandmother    Breast cancer Maternal Aunt    Hypertension Other    Cancer Other    Renal Disease Other     Allergies  Allergen Reactions   Morphine And Codeine Hives   Levaquin [Levofloxacin] Itching    Severe itching; prickly sensation   Enalapril Other (See Comments)    hallucinations   Latex Rash   Tape Rash and Other (See Comments)    Review of Systems  Skin:        Swelling and pain, scabbing, at the site of her perm cath.  Neurological:  Positive for headaches.  Psychiatric/Behavioral:  Positive for memory loss.   All other systems reviewed and are negative.      Objective:   BP 100/64   Ht 5\' 6"  (1.676 m)   Wt 157 lb 9.6 oz (71.5 kg)   BMI 25.44 kg/m   Vitals:   07/27/23 0912  BP: 100/64  Height: 5\' 6"  (1.676 m)  Weight: 157 lb 9.6 oz (71.5 kg)  BMI (Calculated): 25.45    Physical Exam Vitals and nursing note reviewed.  Constitutional:       Appearance: Normal appearance. She is normal weight.  HENT:     Head: Normocephalic.  Eyes:     Extraocular Movements: Extraocular movements intact.     Conjunctiva/sclera: Conjunctivae normal.     Pupils: Pupils are equal, round, and reactive to light.  Cardiovascular:     Rate and Rhythm: Normal rate.  Pulmonary:     Effort: Pulmonary effort is normal.  Skin:      Neurological:     General: No focal deficit present.     Mental Status: She is alert and oriented to person, place, and time. Mental status is at baseline.  Psychiatric:        Mood and Affect: Mood normal.        Behavior: Behavior normal.        Thought Content: Thought content normal.        Judgment: Judgment normal.      No results found for any visits on 07/27/23.  No results found for this or any previous visit (from the past 2160 hours).      Assessment & Plan:   Problem List Items Addressed This Visit       Cardiovascular and Mediastinum   Chronic hypotension (Chronic)   Relevant Medications   enalapril (VASOTEC) 2.5 MG tablet     Other   NPDH (new persistent daily headache) - Primary   Relevant Medications   oxyCODONE-acetaminophen (PERCOCET/ROXICET) 5-325 MG tablet   Other Relevant Orders   MR Brain Wo Contrast (Completed)    Return in about 1 month (around 08/24/2023) for F/U.   Total time spent: 20 minutes  Trenda Frisk, FNP  07/27/2023   This document may have been prepared by [facility] Voice Recognition software and as such may include unintentional dictation errors.

## 2023-07-28 DIAGNOSIS — N2581 Secondary hyperparathyroidism of renal origin: Secondary | ICD-10-CM | POA: Diagnosis not present

## 2023-07-28 DIAGNOSIS — N186 End stage renal disease: Secondary | ICD-10-CM | POA: Diagnosis not present

## 2023-07-28 DIAGNOSIS — Z992 Dependence on renal dialysis: Secondary | ICD-10-CM | POA: Diagnosis not present

## 2023-07-28 NOTE — Progress Notes (Signed)
Subjective:    Patient ID: [name], female    DOB: 06/29/1960, 63 y.o.   MRN: [id number] Chief Complaint  Patient presents with   Follow-up    fu swelling at perm cath site    The patient presents today for evaluation of her right upper chest following PermCath removal.  She notes that she had a swollen area that was very tender and fluid-filled.  Prior to her visit today she notes that the area opened and drained.  Today the area is flat with no purulence and some scabbing but it is very tender for the patient and slightly reddened around the area.    Review of Systems  Skin:  Positive for wound.  All other systems reviewed and are negative.      Objective:   Physical Exam Vitals reviewed.  HENT:     Head: Normocephalic.  Cardiovascular:     Rate and Rhythm: Normal rate.  Pulmonary:     Effort: Pulmonary effort is normal.  Skin:    General: Skin is warm and dry.  Neurological:     Mental Status: She is alert and oriented to person, place, and time.  Psychiatric:        Mood and Affect: Mood normal.        Behavior: Behavior normal.        Thought Content: Thought content normal.        Judgment: Judgment normal.     BP 121/83   Pulse 96   Resp 18   Ht 5\' 6"  (1.676 m)   Wt 157 lb 6.4 oz (71.4 kg)   BMI 25.41 kg/m   Past Medical History:  Diagnosis Date   Adult behavior problems    frontal lobe CVA   Anemia    chronic disease   Atrial flutter by electrocardiogram (HCC) 03/22/2014   Cardiomyopathy (HCC)    CHF (congestive heart failure) (HCC)    Chicken pox    Chronic respiratory failure with hypoxia (HCC) 12/19/2021   Coronary artery disease    Delayed surgical wound healing 04/12/2014   Overview:  Left leg   GERD (gastroesophageal reflux disease)    H/O atrial flutter 04/12/2014   H/O stroke without residual deficits 02/13/2014   H/O: substance abuse (HCC) 02/27/2014   Heart failure (HCC)    Hepatitis    history of hep c   History of CVA  (cerebrovascular accident) 02/21/2014   History of delirium 04/12/2014   Hyperkalemia 11/13/2014   Hyperlipidemia    Hypertension    Hypotension 02/24/2014   Leukocytosis 02/26/2014   Myocardial infarction (HCC)    Obesity    Paroxysmal atrial fibrillation (HCC)    Peripheral vascular disease (HCC)    Postoperative anemia due to acute blood loss 02/27/2014   Renal failure    Renal insufficiency    Stroke [facility]) 2011   Superficial incisional surgical site infection 03/14/2014   Syncope and collapse 07/25/2020    Social History   Socioeconomic History   Marital status: Single    Spouse name: Not on file   Number of children: Not on file   Years of education: Not on file   Highest education level: Not on file  Occupational History   Not on file  Tobacco Use   Smoking status: Some Days    Current packs/day: 0.25    Average packs/day: 0.3 packs/day for 20.0 years (5.0 ttl pk-yrs)    Types: Cigarettes   Smokeless tobacco: Never  Tobacco comments:    smokes two cigarettes a day  Vaping Use   Vaping status: Never Used  Substance and Sexual Activity   Alcohol use: No   Drug use: No   Sexual activity: Not Currently    Birth control/protection: Post-menopausal  Other Topics Concern   Not on file  Social History Narrative   Not on file   Social Drivers of Health   Financial Resource Strain: Not on file  Food Insecurity: No Food Insecurity (05/18/2023)   Hunger Vital Sign    Worried About Running Out of Food in the Last Year: Never true    Ran Out of Food in the Last Year: Never true  Transportation Needs: No Transportation Needs (05/18/2023)   PRAPARE - Administrator, Civil Service (Medical): No    Lack of Transportation (Non-Medical): No  Physical Activity: Not on file  Stress: Not on file  Social Connections: Not on file  Intimate Partner Violence: Not At Risk (05/18/2023)   Humiliation, Afraid, Rape, and Kick questionnaire    Fear of Current or  Ex-Partner: No    Emotionally Abused: No    Physically Abused: No    Sexually Abused: No    Past Surgical History:  Procedure Laterality Date   A/V FISTULAGRAM Left 03/30/2019   Procedure: A/V FISTULAGRAM;  Surgeon: Annice Needy, MD;  Location: [facility];  Service: Cardiovascular;  Laterality: Left;   A/V FISTULAGRAM Left 10/06/2019   Procedure: A/V FISTULAGRAM;  Surgeon: Annice Needy, MD;  Location: [facility];  Service: Cardiovascular;  Laterality: Left;   A/V FISTULAGRAM Left 05/19/2021   Procedure: A/V FISTULAGRAM;  Surgeon: Annice Needy, MD;  Location: [facility];  Service: Cardiovascular;  Laterality: Left;   A/V FISTULAGRAM Left 01/29/2022   Procedure: A/V Fistulagram;  Surgeon: Annice Needy, MD;  Location: [facility];  Service: Cardiovascular;  Laterality: Left;   A/V FISTULAGRAM Left 04/30/2022   Procedure: A/V Fistulagram;  Surgeon: Annice Needy, MD;  Location: [facility];  Service: Cardiovascular;  Laterality: Left;   A/V FISTULAGRAM Left 08/20/2022   Procedure: A/V Fistulagram;  Surgeon: Annice Needy, MD;  Location: [facility];  Service: Cardiovascular;  Laterality: Left;   A/V FISTULAGRAM N/A 12/03/2022   Procedure: A/V Fistulagram;  Surgeon: Annice Needy, MD;  Location: [facility];  Service: Cardiovascular;  Laterality: N/A;   A/V FISTULAGRAM Left 02/11/2023   Procedure: A/V Fistulagram;  Surgeon: Annice Needy, MD;  Location: [facility];  Service: Cardiovascular;  Laterality: Left;   COLONOSCOPY     COLONOSCOPY WITH PROPOFOL N/A 04/22/2021   Procedure: COLONOSCOPY WITH PROPOFOL;  Surgeon: Regis Bill, MD;  Location: [facility] ENDOSCOPY;  Service: Endoscopy;  Laterality: N/A;   CORONARY ANGIOPLASTY WITH STENT PLACEMENT  2013   CORONARY ARTERY BYPASS GRAFT     DIALYSIS FISTULA CREATION     DIALYSIS/PERMA CATHETER INSERTION N/A 04/08/2023   Procedure: DIALYSIS/PERMA CATHETER INSERTION;  Surgeon:  Annice Needy, MD;  Location: [facility];  Service: Cardiovascular;  Laterality: N/A;   DIALYSIS/PERMA CATHETER REMOVAL N/A 07/06/2023   Procedure: DIALYSIS/PERMA CATHETER REMOVAL;  Surgeon: Renford Dills, MD;  Location: [facility];  Service: Cardiovascular;  Laterality: N/A;   ESOPHAGOGASTRODUODENOSCOPY     ESOPHAGOGASTRODUODENOSCOPY N/A 04/22/2021   Procedure: ESOPHAGOGASTRODUODENOSCOPY (EGD);  Surgeon: Regis Bill, MD;  Location: [facility] ENDOSCOPY;  Service: Endoscopy;  Laterality:  N/A;   FLEXIBLE SIGMOIDOSCOPY N/A 02/23/2019   Procedure: FLEXIBLE SIGMOIDOSCOPY;  Surgeon: Christena Deem, MD;  Location: [facility] ENDOSCOPY;  Service: Endoscopy;  Laterality: N/A;   LEFT HEART CATH AND CORS/GRAFTS ANGIOGRAPHY N/A 08/08/2018   Procedure: LEFT HEART CATH AND CORS/GRAFTS ANGIOGRAPHY;  Surgeon: Laurier Nancy, MD;  Location: [facility];  Service: Cardiovascular;  Laterality: N/A;   LEFT HEART CATH AND CORS/GRAFTS ANGIOGRAPHY N/A 01/30/2019   Procedure: LEFT HEART CATH AND CORS/GRAFTS ANGIOGRAPHY;  Surgeon: Laurier Nancy, MD;  Location: [facility];  Service: Cardiovascular;  Laterality: N/A;   percutaneous insertion intra aortic balloon right cath     ultrasound guided pericardiocentesis      Family History  Problem Relation Age of Onset   Hypertension Mother    Ovarian cancer Mother    Diabetes type II Mother    Hypertension Father    Kidney disease Father    Hypertension Sister    Diabetes type II Maternal Grandmother    Breast cancer Maternal Grandmother    Breast cancer Maternal Aunt    Hypertension Other    Cancer Other    Renal Disease Other     Allergies  Allergen Reactions   Morphine And Codeine Hives   Levaquin [Levofloxacin] Itching    Severe itching; prickly sensation   Enalapril Other (See Comments)    hallucinations   Latex Rash   Tape Rash and Other (See Comments)       Latest Ref Rng & Units 05/19/2023    9:40 AM  05/19/2023    7:43 AM 05/18/2023    2:24 PM  CBC  WBC 4.0 - 10.5 K/uL 5.3  4.7  4.2   Hemoglobin 12.0 - 15.0 g/dL 16.1  09.6  04.5   Hematocrit 36.0 - 46.0 % 34.6  32.9  32.4   Platelets 150 - 400 K/uL 128  117  117       CMP     Component Value Date/Time   NA 134 (L) 05/19/2023 0940   NA 139 07/09/2014 0503   K 4.7 05/19/2023 0940   K 4.7 07/11/2014 0958   CL 96 (L) 05/19/2023 0940   CL 98 07/09/2014 0503   CO2 25 05/19/2023 0940   CO2 30 07/09/2014 0503   GLUCOSE 111 (H) 05/19/2023 0940   GLUCOSE 107 (H) 07/09/2014 0503   BUN 59 (H) 05/19/2023 0940   BUN 39 (H) 07/09/2014 0503   CREATININE 8.16 (H) 05/19/2023 0940   CREATININE 5.53 (H) 07/09/2014 0503   CALCIUM 9.5 05/19/2023 0940   CALCIUM 8.3 (L) 07/09/2014 0503   PROT 7.7 05/17/2023 0747   PROT 8.4 (H) 07/07/2014 2207   ALBUMIN 3.6 05/19/2023 0940   ALBUMIN 3.1 (L) 07/07/2014 2207   AST 24 05/17/2023 0747   AST 76 (H) 07/07/2014 2207   ALT 16 05/17/2023 0747   ALT 41 07/07/2014 2207   ALKPHOS 105 05/17/2023 0747   ALKPHOS 107 07/07/2014 2207   BILITOT 0.8 05/17/2023 0747   BILITOT 0.7 07/07/2014 2207   GFRNONAA 5 (L) 05/19/2023 0940   GFRNONAA 9 (L) 07/09/2014 0503   GFRNONAA 9 (L) 02/11/2014 0300     No results found.     Assessment & Plan:   1. ESRD on hemodialysis (HCC) (Primary) I suspect that the patient may have had a fluid collection following removal of the PermCath.  It is currently drained as I was unable to express any purulent drainage.  However it still  red and very tender and so we will put the patient on antibiotics.  She has a follow-up in several weeks.  She is advised that if it does not improve to contact us we will see her back sooner  2. Primary hypertension Continue antihypertensive medications as already ordered, these medications have been reviewed and there are no changes at this time.   Current Outpatient Medications on File Prior to Visit  Medication Sig Dispense Refill    albuterol (VENTOLIN HFA) 108 (90 Base) MCG/ACT inhaler Inhale 1-2 puffs into the lungs every 6 (six) hours as needed for wheezing or shortness of breath. 20.1 g 3   apixaban (ELIQUIS) 5 MG TABS tablet Take 5 mg by mouth 2 (two) times daily.     Bismuth Tribromoph-Petrolatum (XEROFORM PETROLAT PATCH 2"X2") PADS Apply 1 each topically every 3 (three) days. 25 each 0   hydrOXYzine (ATARAX/VISTARIL) 50 MG tablet Take 50 mg by mouth at bedtime.     levothyroxine (SYNTHROID) 75 MCG tablet Take 75 mcg by mouth daily before breakfast.      midodrine (PROAMATINE) 10 MG tablet Take 1 tablet (10 mg total) by mouth 3 (three) times daily. Monday, Wednesday, Friday with hemodialysis.   Take 1 tablet once daily on non-dialysis days. 90 tablet 0   omeprazole (PRILOSEC) 20 MG capsule Take 20 mg by mouth daily.     oxyCODONE-acetaminophen (PERCOCET/ROXICET) 5-325 MG tablet Take 1 tablet by mouth every 6 (six) hours as needed for severe pain (pain score 7-10). 20 tablet 0   ranolazine (RANEXA) 1000 MG SR tablet Take 1,000 mg by mouth 2 (two) times daily.     rosuvastatin (CRESTOR) 20 MG tablet Take 20 mg by mouth at bedtime.     sevelamer carbonate (RENVELA) 800 MG tablet Take 3,200 mg by mouth 3 (three) times daily with meals.     VELTASSA 8.4 g packet Take 1 packet by mouth daily.     enalapril (VASOTEC) 2.5 MG tablet Take 2.5 mg by mouth daily. (Patient not taking: Reported on 07/27/2023)     losartan (COZAAR) 25 MG tablet Take 1/2 tab daily (Patient not taking: Reported on 07/27/2023) 90 tablet 1   Current Facility-Administered Medications on File Prior to Visit  Medication Dose Route Frequency Provider Last Rate Last Admin   heparin injection    PRN Annice Needy, MD   10,000 Units at 04/08/23 1412    There are no Patient Instructions on file for this visit. No follow-ups on file.   Georgiana Spinner, NP

## 2023-07-30 DIAGNOSIS — N2581 Secondary hyperparathyroidism of renal origin: Secondary | ICD-10-CM | POA: Diagnosis not present

## 2023-07-30 DIAGNOSIS — Z992 Dependence on renal dialysis: Secondary | ICD-10-CM | POA: Diagnosis not present

## 2023-07-30 DIAGNOSIS — N186 End stage renal disease: Secondary | ICD-10-CM | POA: Diagnosis not present

## 2023-08-02 ENCOUNTER — Telehealth: Payer: Self-pay

## 2023-08-02 DIAGNOSIS — N186 End stage renal disease: Secondary | ICD-10-CM | POA: Diagnosis not present

## 2023-08-02 DIAGNOSIS — N2581 Secondary hyperparathyroidism of renal origin: Secondary | ICD-10-CM | POA: Diagnosis not present

## 2023-08-02 DIAGNOSIS — Z992 Dependence on renal dialysis: Secondary | ICD-10-CM | POA: Diagnosis not present

## 2023-08-02 NOTE — Telephone Encounter (Signed)
Pt LM stating " that medication shaukat has her taking is still making her head hurt" she  didn't name the medication, need to call her back to find out what she talking about

## 2023-08-06 DIAGNOSIS — N2581 Secondary hyperparathyroidism of renal origin: Secondary | ICD-10-CM | POA: Diagnosis not present

## 2023-08-06 DIAGNOSIS — Z992 Dependence on renal dialysis: Secondary | ICD-10-CM | POA: Diagnosis not present

## 2023-08-06 DIAGNOSIS — N186 End stage renal disease: Secondary | ICD-10-CM | POA: Diagnosis not present

## 2023-08-09 DIAGNOSIS — N2581 Secondary hyperparathyroidism of renal origin: Secondary | ICD-10-CM | POA: Diagnosis not present

## 2023-08-09 DIAGNOSIS — Z992 Dependence on renal dialysis: Secondary | ICD-10-CM | POA: Diagnosis not present

## 2023-08-09 DIAGNOSIS — N186 End stage renal disease: Secondary | ICD-10-CM | POA: Diagnosis not present

## 2023-08-10 ENCOUNTER — Encounter: Payer: Self-pay | Admitting: Internal Medicine

## 2023-08-11 DIAGNOSIS — N2581 Secondary hyperparathyroidism of renal origin: Secondary | ICD-10-CM | POA: Diagnosis not present

## 2023-08-11 DIAGNOSIS — N186 End stage renal disease: Secondary | ICD-10-CM | POA: Diagnosis not present

## 2023-08-11 DIAGNOSIS — Z992 Dependence on renal dialysis: Secondary | ICD-10-CM | POA: Diagnosis not present

## 2023-08-13 DIAGNOSIS — Z992 Dependence on renal dialysis: Secondary | ICD-10-CM | POA: Diagnosis not present

## 2023-08-13 DIAGNOSIS — N186 End stage renal disease: Secondary | ICD-10-CM | POA: Diagnosis not present

## 2023-08-13 DIAGNOSIS — N2581 Secondary hyperparathyroidism of renal origin: Secondary | ICD-10-CM | POA: Diagnosis not present

## 2023-08-16 ENCOUNTER — Ambulatory Visit
Admission: RE | Admit: 2023-08-16 | Discharge: 2023-08-16 | Disposition: A | Discharge status: Home / Self Care | Payer: 59 | Source: Ambulatory Visit | Attending: Family | Admitting: Family

## 2023-08-16 DIAGNOSIS — N186 End stage renal disease: Secondary | ICD-10-CM | POA: Diagnosis not present

## 2023-08-16 DIAGNOSIS — Z992 Dependence on renal dialysis: Secondary | ICD-10-CM | POA: Diagnosis not present

## 2023-08-16 DIAGNOSIS — G4452 New daily persistent headache (NDPH): Secondary | ICD-10-CM

## 2023-08-16 DIAGNOSIS — N2581 Secondary hyperparathyroidism of renal origin: Secondary | ICD-10-CM | POA: Diagnosis not present

## 2023-08-18 DIAGNOSIS — N2581 Secondary hyperparathyroidism of renal origin: Secondary | ICD-10-CM | POA: Diagnosis not present

## 2023-08-18 DIAGNOSIS — Z992 Dependence on renal dialysis: Secondary | ICD-10-CM | POA: Diagnosis not present

## 2023-08-18 DIAGNOSIS — N186 End stage renal disease: Secondary | ICD-10-CM | POA: Diagnosis not present

## 2023-08-20 DIAGNOSIS — Z992 Dependence on renal dialysis: Secondary | ICD-10-CM | POA: Diagnosis not present

## 2023-08-20 DIAGNOSIS — N186 End stage renal disease: Secondary | ICD-10-CM | POA: Diagnosis not present

## 2023-08-20 DIAGNOSIS — N2581 Secondary hyperparathyroidism of renal origin: Secondary | ICD-10-CM | POA: Diagnosis not present

## 2023-08-20 NOTE — Progress Notes (Signed)
 Established Patient Office Visit  Subjective:  Patient ID: [name], female    DOB: April 02, 1961  Age: 63 y.o. MRN: [id number]  Chief Complaint  Patient presents with   Follow-up    3 month follow up, discuss referral.    Patient is here today for her 3 months follow up.  She has been feeling fairly well since last appointment.   She does have additional concerns to discuss today.  Was recently admitted to [facility] with severe bradycardia and low BP.  She was started on midodrine, but she has been continuing to have issues.  She also has had headaches since then as well.  Asks if we can get her set up for a referral to the cardiologist she used to see at [facility] in [place].   [facility] are done at dialysis. She needs refills.   I have reviewed her active problem list, medication list, allergies, notes from last encounter, lab results for her appointment today.      No other concerns at this time.   Past Medical History:  Diagnosis Date   Adult behavior problems    frontal lobe CVA   Anemia    chronic disease   Atrial flutter by electrocardiogram (HCC) 03/22/2014   Cardiomyopathy (HCC)    CHF (congestive heart failure) (HCC)    Chicken pox    Chronic respiratory failure with hypoxia (HCC) 12/19/2021   Coronary artery disease    Delayed surgical wound healing 04/12/2014   Overview:  Left leg   GERD (gastroesophageal reflux disease)    H/O atrial flutter 04/12/2014   H/O stroke without residual deficits 02/13/2014   H/O: substance abuse (HCC) 02/27/2014   Heart failure (HCC)    Hepatitis    history of hep c   History of CVA (cerebrovascular accident) 02/21/2014   History of delirium 04/12/2014   Hyperkalemia 11/13/2014   Hyperlipidemia    Hypertension    Hypotension 02/24/2014   Leukocytosis 02/26/2014   Myocardial infarction [facility])    Obesity    Paroxysmal atrial fibrillation (HCC)    Peripheral vascular disease (HCC)    Postoperative anemia due to acute blood  loss 02/27/2014   Renal failure    Renal insufficiency    Stroke [facility]) 2011   Superficial incisional surgical site infection 03/14/2014   Syncope and collapse 07/25/2020    Past Surgical History:  Procedure Laterality Date   A/V FISTULAGRAM Left 03/30/2019   Procedure: A/V FISTULAGRAM;  Surgeon: Annice Needy, MD;  Location: [facility] INVASIVE CV LAB;  Service: Cardiovascular;  Laterality: Left;   A/V FISTULAGRAM Left 10/06/2019   Procedure: A/V FISTULAGRAM;  Surgeon: Annice Needy, MD;  Location: [facility] INV[facility] CV LAB;  Service: Cardiovascular;  Laterality: Left;   A/V FISTULAGRAM Left 05/19/2021   Procedure: A/V FISTULAGRAM;  Surgeon: Annice Needy, MD;  Location: [facility] INV[facility] CV LAB;  Service: Cardiovascular;  Laterality: Left;   A/V FISTULAGRAM Left 01/29/2022   Procedure: A/V Fistulagram;  Surgeon: Annice Needy, MD;  Location: [facility] INV[facility] CV [facility];  Service: Cardiovascular;  Laterality: Left;   A/V FISTULAGRAM Left 04/30/2022   Procedure: A/V Fistulagram;  Surgeon: Annice Needy, MD;  Location: [facility] INV[facility] CV [facility];  Service: Cardiovascular;  Laterality: Left;   A/V FISTULAGRAM Left 08/20/2022   Procedure: A/V Fistulagram;  Surgeon: Annice Needy, MD;  Location: [facility] INV[facility] CV [facility];  Service: Cardiovascular;  Laterality: Left;   A/V FISTULAGRAM N/A 12/03/2022   Procedure: A/V Fistulagram;  Surgeon: Wyn Quaker,  Marlow Baars, MD;  Location: [facility] CV [facility];  Service: Cardiovascular;  Laterality: N/A;   A/V FISTULAGRAM Left 02/11/2023   Procedure: A/V Fistulagram;  Surgeon: Annice Needy, MD;  Location: [facility] INV[facility] CV [facility];  Service: Cardiovascular;  Laterality: Left;   COLONOSCOPY     COLONOSCOPY WITH PROPOFOL N/A 04/22/2021   Procedure: COLONOSCOPY WITH PROPOFOL;  Surgeon: Regis Bill, MD;  Location: [facility] ENDOSCOPY;  Service: Endoscopy;  Laterality: N/A;   CORONARY ANGIOPLASTY WITH STENT PLACEMENT  2013   CORONARY ARTERY BYPASS GRAFT     DIALYSIS FISTULA CREATION     DIALYSIS/PERMA CATHETER  INSERTION N/A 04/08/2023   Procedure: DIALYSIS/PERMA CATHETER INSERTION;  Surgeon: Annice Needy, MD;  Location: [facility] CV [facility];  Service: Cardiovascular;  Laterality: N/A;   DIALYSIS/PERMA CATHETER REMOVAL N/A 07/06/2023   Procedure: DIALYSIS/PERMA CATHETER REMOVAL;  Surgeon: Renford Dills, MD;  Location: [facility] CV [facility];  Service: Cardiovascular;  Laterality: N/A;   ESOPHAGOGASTRODUODENOSCOPY     ESOPHAGOGASTRODUODENOSCOPY N/A 04/22/2021   Procedure: ESOPHAGOGASTRODUODENOSCOPY (EGD);  Surgeon: Regis Bill, MD;  Location: [facility] ENDOSCOPY;  Service: Endoscopy;  Laterality: N/A;   FLEXIBLE SIGMOIDOSCOPY N/A 02/23/2019   Procedure: FLEXIBLE SIGMOIDOSCOPY;  Surgeon: Christena Deem, MD;  Location: [facility] ENDOSCOPY;  Service: Endoscopy;  Laterality: N/A;   LEFT HEART CATH AND CORS/GRAFTS ANGIOGRAPHY N/A 08/08/2018   Procedure: LEFT HEART CATH AND CORS/GRAFTS ANGIOGRAPHY;  Surgeon: Laurier Nancy, MD;  Location: [facility] CV [facility];  Service: Cardiovascular;  Laterality: N/A;   LEFT HEART CATH AND CORS/GRAFTS ANGIOGRAPHY N/A 01/30/2019   Procedure: LEFT HEART CATH AND CORS/GRAFTS ANGIOGRAPHY;  Surgeon: Laurier Nancy, MD;  Location: [facility] INV[facility] CV [facility];  Service: Cardiovascular;  Laterality: N/A;   percutaneous insertion intra aortic balloon right cath     ultrasound guided pericardiocentesis      Social History   Socioeconomic History   Marital status: Single    Spouse name: Not on file   Number of children: Not on file   Years of education: Not on file   Highest education level: Not on file  Occupational History   Not on file  Tobacco Use   Smoking status: Some Days    Current packs/day: 0.25    Average packs/day: 0.3 packs/day for 20.0 years (5.0 ttl pk-yrs)    Types: Cigarettes   Smokeless tobacco: Never   Tobacco comments:    smokes two cigarettes a day  Vaping Use   Vaping status: Never Used  Substance and Sexual Activity   Alcohol use: No   Drug use:  No   Sexual activity: Not Currently    Birth control/protection: Post-menopausal  Other Topics Concern   Not on file  Social History Narrative   Not on file   Social Drivers of Health   Financial Resource Strain: Not on file  Food Insecurity: No Food Insecurity (05/18/2023)   Hunger Vital Sign    Worried About Running Out of Food in the Last Year: Never true    Ran Out of Food in the Last Year: Never true  Transportation Needs: No Transportation Needs (05/18/2023)   PRAPARE - Administrator, Civil Service (Medical): No    Lack of Transportation (Non-Medical): No  Physical Activity: Not on file  Stress: Not on file  Social Connections: Not on file  Intimate Partner Violence: Not At Risk (05/18/2023)   Humiliation, Afraid, Rape, and Kick questionnaire    Fear of Current or Ex-Partner: No  Emotionally Abused: No    Physically Abused: No    Sexually Abused: No    Family History  Problem Relation Age of Onset   Hypertension Mother    Ovarian cancer Mother    Diabetes type II Mother    Hypertension Father    Kidney disease Father    Hypertension Sister    Diabetes type II Maternal Grandmother    Breast cancer Maternal Grandmother    Breast cancer Maternal Aunt    Hypertension Other    Cancer Other    Renal Disease Other     Allergies  Allergen Reactions   Morphine And Codeine Hives   Levaquin [Levofloxacin] Itching    Severe itching; prickly sensation   Enalapril Other (See Comments)    hallucinations   Latex Rash   Tape Rash and Other (See Comments)    Review of Systems  Constitutional:  Positive for malaise/fatigue.  Neurological:  Positive for weakness and headaches.       Objective:   BP (!) 82/52   Pulse 68   Ht 5\' 6"  (1.676 m)   Wt 159 lb (72.1 kg)   SpO2 99%   BMI 25.66 kg/m   Vitals:   06/22/23 1054  BP: (!) 82/52  Pulse: 68  Height: 5\' 6"  (1.676 m)  Weight: 159 lb (72.1 kg)  SpO2: 99%  BMI (Calculated): 25.68    Physical  Exam Vitals and nursing note reviewed.  Constitutional:      Appearance: Normal appearance. She is normal weight.  HENT:     Head: Normocephalic.  Eyes:     Extraocular Movements: Extraocular movements intact.     Conjunctiva/sclera: Conjunctivae normal.     Pupils: Pupils are equal, round, and reactive to light.  Cardiovascular:     Rate and Rhythm: Normal rate and regular rhythm.     Heart sounds: Murmur heard.  Pulmonary:     Effort: Pulmonary effort is normal.  Neurological:     General: No focal deficit present.     Mental Status: She is alert and oriented to person, place, and time. Mental status is at baseline.  Psychiatric:        Mood and Affect: Mood normal.        Behavior: Behavior normal.        Thought Content: Thought content normal.        Judgment: Judgment normal.      No results found for any visits on 06/22/23.  No results found for this or any previous visit (from the past 2160 hours).     Assessment & Plan:   Problem List Items Addressed This Visit       Cardiovascular and Mediastinum   Chronic hypotension - Primary (Chronic)   Relevant Medications   midodrine (PROAMATINE) 10 MG tablet   Other Relevant Orders   Ambulatory referral to Cardiology   HFrEF (heart failure with reduced ejection fraction) (HCC) (Chronic)   Relevant Medications   midodrine (PROAMATINE) 10 MG tablet   Other Relevant Orders   Ambulatory referral to Cardiology   Chronic combined systolic and diastolic CHF (congestive heart failure) (HCC) (Chronic)   Relevant Medications   midodrine (PROAMATINE) 10 MG tablet   Other Relevant Orders   Ambulatory referral to Cardiology   Will send referral to Cardiologist for her.  Increase Midodrine dose.  Will reassess at follow up.  Return in about 2 weeks (around 07/06/2023) for F/U.   Total time spent: 20 minutes  Keyundra Fant M  [facility], FNP  06/22/2023   This document may have been prepared by [facility] Voice Recognition software  and as such may include unintentional dictation errors.

## 2023-08-23 DIAGNOSIS — Z992 Dependence on renal dialysis: Secondary | ICD-10-CM | POA: Diagnosis not present

## 2023-08-23 DIAGNOSIS — N186 End stage renal disease: Secondary | ICD-10-CM | POA: Diagnosis not present

## 2023-08-23 DIAGNOSIS — N2581 Secondary hyperparathyroidism of renal origin: Secondary | ICD-10-CM | POA: Diagnosis not present

## 2023-08-24 ENCOUNTER — Encounter: Payer: Self-pay | Admitting: Family

## 2023-08-24 ENCOUNTER — Ambulatory Visit (INDEPENDENT_AMBULATORY_CARE_PROVIDER_SITE_OTHER): Payer: 59 | Admitting: Family

## 2023-08-24 VITALS — BP 118/70 | HR 45 | Ht 66.0 in | Wt 156.0 lb

## 2023-08-24 DIAGNOSIS — G4452 New daily persistent headache (NDPH): Secondary | ICD-10-CM | POA: Diagnosis not present

## 2023-08-24 DIAGNOSIS — R9089 Other abnormal findings on diagnostic imaging of central nervous system: Secondary | ICD-10-CM

## 2023-08-24 DIAGNOSIS — Z0130 Encounter for examination of blood pressure without abnormal findings: Secondary | ICD-10-CM

## 2023-08-24 DIAGNOSIS — I509 Heart failure, unspecified: Secondary | ICD-10-CM | POA: Diagnosis not present

## 2023-08-25 DIAGNOSIS — N2581 Secondary hyperparathyroidism of renal origin: Secondary | ICD-10-CM | POA: Diagnosis not present

## 2023-08-25 DIAGNOSIS — N186 End stage renal disease: Secondary | ICD-10-CM | POA: Diagnosis not present

## 2023-08-25 DIAGNOSIS — Z992 Dependence on renal dialysis: Secondary | ICD-10-CM | POA: Diagnosis not present

## 2023-08-26 ENCOUNTER — Ambulatory Visit (INDEPENDENT_AMBULATORY_CARE_PROVIDER_SITE_OTHER): Payer: 59

## 2023-08-26 DIAGNOSIS — N186 End stage renal disease: Secondary | ICD-10-CM

## 2023-08-26 DIAGNOSIS — Z992 Dependence on renal dialysis: Secondary | ICD-10-CM | POA: Diagnosis not present

## 2023-08-27 DIAGNOSIS — Z992 Dependence on renal dialysis: Secondary | ICD-10-CM | POA: Diagnosis not present

## 2023-08-27 DIAGNOSIS — I110 Hypertensive heart disease with heart failure: Secondary | ICD-10-CM | POA: Diagnosis not present

## 2023-08-27 DIAGNOSIS — D638 Anemia in other chronic diseases classified elsewhere: Secondary | ICD-10-CM | POA: Diagnosis not present

## 2023-08-27 DIAGNOSIS — N2581 Secondary hyperparathyroidism of renal origin: Secondary | ICD-10-CM | POA: Diagnosis not present

## 2023-08-27 DIAGNOSIS — N186 End stage renal disease: Secondary | ICD-10-CM | POA: Diagnosis not present

## 2023-08-27 DIAGNOSIS — Z7901 Long term (current) use of anticoagulants: Secondary | ICD-10-CM | POA: Diagnosis not present

## 2023-08-27 DIAGNOSIS — I5020 Unspecified systolic (congestive) heart failure: Secondary | ICD-10-CM | POA: Diagnosis not present

## 2023-08-27 DIAGNOSIS — I4892 Unspecified atrial flutter: Secondary | ICD-10-CM | POA: Diagnosis not present

## 2023-08-27 NOTE — Telephone Encounter (Signed)
Already taken care of by provider

## 2023-08-30 DIAGNOSIS — N186 End stage renal disease: Secondary | ICD-10-CM | POA: Diagnosis not present

## 2023-08-30 DIAGNOSIS — N2581 Secondary hyperparathyroidism of renal origin: Secondary | ICD-10-CM | POA: Diagnosis not present

## 2023-08-30 DIAGNOSIS — Z992 Dependence on renal dialysis: Secondary | ICD-10-CM | POA: Diagnosis not present

## 2023-08-31 ENCOUNTER — Encounter (INDEPENDENT_AMBULATORY_CARE_PROVIDER_SITE_OTHER): Payer: Self-pay | Admitting: Vascular Surgery

## 2023-08-31 ENCOUNTER — Ambulatory Visit (INDEPENDENT_AMBULATORY_CARE_PROVIDER_SITE_OTHER): Payer: 59 | Admitting: Vascular Surgery

## 2023-08-31 VITALS — BP 109/71 | HR 87 | Resp 18 | Ht 66.0 in | Wt 156.0 lb

## 2023-08-31 DIAGNOSIS — I5042 Chronic combined systolic (congestive) and diastolic (congestive) heart failure: Secondary | ICD-10-CM | POA: Diagnosis not present

## 2023-08-31 DIAGNOSIS — Z992 Dependence on renal dialysis: Secondary | ICD-10-CM

## 2023-08-31 DIAGNOSIS — I10 Essential (primary) hypertension: Secondary | ICD-10-CM | POA: Diagnosis not present

## 2023-08-31 DIAGNOSIS — N186 End stage renal disease: Secondary | ICD-10-CM | POA: Diagnosis not present

## 2023-08-31 NOTE — Assessment & Plan Note (Signed)
 Access is currently patent and working well.  Continue to rotate her access sites to avoid wearing the fistula out.  No role for intervention at current.  Recheck in 6 months.

## 2023-08-31 NOTE — Progress Notes (Signed)
 MRN : [id number]  [name] is a 63 y.o. (01-22-1961) female who presents with chief complaint of  Chief Complaint  Patient presents with   Follow-up    F/u 3-4 months  HDA  .  History of Present Illness: Patient returns today in follow up of her dialysis access.  She had her PermCath removed a couple of months ago and has been using her left upper arm AV fistula with jump graft.  This is doing well.  She had a recent duplex in the last few days which showed a patent fistula and jump graft without recurrent stenosis.  Current Outpatient Medications  Medication Sig Dispense Refill   albuterol (VENTOLIN HFA) 108 (90 Base) MCG/ACT inhaler Inhale 1-2 puffs into the lungs every 6 (six) hours as needed for wheezing or shortness of breath. 20.1 g 3   apixaban (ELIQUIS) 5 MG TABS tablet Take 5 mg by mouth 2 (two) times daily.     Bismuth Tribromoph-Petrolatum (XEROFORM PETROLAT PATCH 2"X2") PADS Apply 1 each topically every 3 (three) days. 25 each 0   doxycycline (VIBRAMYCIN) 100 MG capsule Take 1 capsule (100 mg total) by mouth 2 (two) times daily. 20 capsule 0   enalapril (VASOTEC) 2.5 MG tablet Take 2.5 mg by mouth daily.     hydrOXYzine (ATARAX/VISTARIL) 50 MG tablet Take 50 mg by mouth at bedtime.     levothyroxine (SYNTHROID) 75 MCG tablet Take 75 mcg by mouth daily before breakfast.      lidocaine-prilocaine (EMLA) cream Apply 1 Application topically once.     losartan (COZAAR) 25 MG tablet Take 1/2 tab daily 90 tablet 1   metoprolol succinate (TOPROL-XL) 25 MG 24 hr tablet Take 1 tablet by mouth daily.     midodrine (PROAMATINE) 10 MG tablet Take 1 tablet (10 mg total) by mouth 3 (three) times daily. Monday, Wednesday, Friday with hemodialysis.   Take 1 tablet once daily on non-dialysis days. 90 tablet 0   omeprazole (PRILOSEC) 20 MG capsule Take 20 mg by mouth daily.     oxyCODONE-acetaminophen (PERCOCET/ROXICET) 5-325 MG tablet Take 1 tablet by mouth every 6 (six) hours as  needed for severe pain (pain score 7-10). 20 tablet 0   ranolazine (RANEXA) 1000 MG SR tablet Take 1,000 mg by mouth 2 (two) times daily.     rosuvastatin (CRESTOR) 20 MG tablet Take 20 mg by mouth at bedtime.     sevelamer carbonate (RENVELA) 800 MG tablet Take 3,200 mg by mouth 3 (three) times daily with meals.     VELTASSA 8.4 g packet Take 1 packet by mouth daily.     No current facility-administered medications for this visit.   Facility-Administered Medications Ordered in Other Visits  Medication Dose Route Frequency Provider Last Rate Last Admin   heparin injection    PRN Annice Needy, MD   10,000 Units at 04/08/23 1412    Past Medical History:  Diagnosis Date   Adult behavior problems    frontal lobe CVA   Anemia    chronic disease   Atrial flutter by electrocardiogram (HCC) 03/22/2014   Cardiomyopathy (HCC)    CHF (congestive heart failure) (HCC)    Chicken pox    Chronic respiratory failure with hypoxia (HCC) 12/19/2021   Coronary artery disease    Delayed surgical wound healing 04/12/2014   Overview:  Left leg   GERD (gastroesophageal reflux disease)    H/O atrial flutter 04/12/2014   H/O stroke without residual deficits 02/13/2014  H/O: substance abuse (HCC) 02/27/2014   Heart failure (HCC)    Hepatitis    history of hep c   History of CVA (cerebrovascular accident) 02/21/2014   History of delirium 04/12/2014   Hyperkalemia 11/13/2014   Hyperlipidemia    Hypertension    Hypotension 02/24/2014   Leukocytosis 02/26/2014   Myocardial infarction [facility])    Obesity    Paroxysmal atrial fibrillation (HCC)    Peripheral vascular disease (HCC)    Postoperative anemia due to acute blood loss 02/27/2014   Renal failure    Renal insufficiency    Stroke [facility]) 2011   Superficial incisional surgical site infection 03/14/2014   Syncope and collapse 07/25/2020    Past Surgical History:  Procedure Laterality Date   A/V FISTULAGRAM Left 03/30/2019   Procedure: A/V  FISTULAGRAM;  Surgeon: Annice Needy, MD;  Location: [facility] INV[facility]IVE CV [facility];  Service: Cardiovascular;  Laterality: Left;   A/V FISTULAGRAM Left 10/06/2019   Procedure: A/V FISTULAGRAM;  Surgeon: Annice Needy, MD;  Location: [facility];  Service: Cardiovascular;  Laterality: Left;   A/V FISTULAGRAM Left 05/19/2021   Procedure: A/V FISTULAGRAM;  Surgeon: Annice Needy, MD;  Location: [facility];  Service: Cardiovascular;  Laterality: Left;   A/V FISTULAGRAM Left 01/29/2022   Procedure: A/V Fistulagram;  Surgeon: Annice Needy, MD;  Location: [facility];  Service: Cardiovascular;  Laterality: Left;   A/V FISTULAGRAM Left 04/30/2022   Procedure: A/V Fistulagram;  Surgeon: Annice Needy, MD;  Location: [facility];  Service: Cardiovascular;  Laterality: Left;   A/V FISTULAGRAM Left 08/20/2022   Procedure: A/V Fistulagram;  Surgeon: Annice Needy, MD;  Location: [facility];  Service: Cardiovascular;  Laterality: Left;   A/V FISTULAGRAM N/A 12/03/2022   Procedure: A/V Fistulagram;  Surgeon: Annice Needy, MD;  Location: [facility];  Service: Cardiovascular;  Laterality: N/A;   A/V FISTULAGRAM Left 02/11/2023   Procedure: A/V Fistulagram;  Surgeon: Annice Needy, MD;  Location: [facility];  Service: Cardiovascular;  Laterality: Left;   COLONOSCOPY     COLONOSCOPY WITH PROPOFOL N/A 04/22/2021   Procedure: COLONOSCOPY WITH PROPOFOL;  Surgeon: Regis Bill, MD;  Location: [facility] ENDOSCOPY;  Service: Endoscopy;  Laterality: N/A;   CORONARY ANGIOPLASTY WITH STENT PLACEMENT  2013   CORONARY ARTERY BYPASS GRAFT     DIALYSIS FISTULA CREATION     DIALYSIS/PERMA CATHETER INSERTION N/A 04/08/2023   Procedure: DIALYSIS/PERMA CATHETER INSERTION;  Surgeon: Annice Needy, MD;  Location: [facility];  Service: Cardiovascular;  Laterality: N/A;   DIALYSIS/PERMA CATHETER REMOVAL N/A 07/06/2023   Procedure: DIALYSIS/PERMA CATHETER REMOVAL;  Surgeon:  Renford Dills, MD;  Location: [facility];  Service: Cardiovascular;  Laterality: N/A;   ESOPHAGOGASTRODUODENOSCOPY     ESOPHAGOGASTRODUODENOSCOPY N/A 04/22/2021   Procedure: ESOPHAGOGASTRODUODENOSCOPY (EGD);  Surgeon: Regis Bill, MD;  Location: [facility] ENDOSCOPY;  Service: Endoscopy;  Laterality: N/A;   FLEXIBLE SIGMOIDOSCOPY N/A 02/23/2019   Procedure: FLEXIBLE SIGMOIDOSCOPY;  Surgeon: Christena Deem, MD;  Location: [facility] ENDOSCOPY;  Service: Endoscopy;  Laterality: N/A;   LEFT HEART CATH AND CORS/GRAFTS ANGIOGRAPHY N/A 08/08/2018   Procedure: LEFT HEART CATH AND CORS/GRAFTS ANGIOGRAPHY;  Surgeon: Laurier Nancy, MD;  Location: [facility];  Service: Cardiovascular;  Laterality: N/A;   LEFT HEART CATH AND CORS/GRAFTS ANGIOGRAPHY N/A 01/30/2019   Procedure: LEFT HEART CATH AND CORS/GRAFTS ANGIOGRAPHY;  Surgeon: Adrian Blackwater  A, MD;  Location: [facility];  Service: Cardiovascular;  Laterality: N/A;   percutaneous insertion intra aortic balloon right cath     ultrasound guided pericardiocentesis       Social History   Tobacco Use   Smoking status: Some Days    Current packs/day: 0.25    Average packs/day: 0.3 packs/day for 20.0 years (5.0 ttl pk-yrs)    Types: Cigarettes   Smokeless tobacco: Never   Tobacco comments:    smokes two cigarettes a day  Vaping Use   Vaping status: Never Used  Substance Use Topics   Alcohol use: No   Drug use: No      Family History  Problem Relation Age of Onset   Hypertension Mother    Ovarian cancer Mother    Diabetes type II Mother    Hypertension Father    Kidney disease Father    Hypertension Sister    Diabetes type II Maternal Grandmother    Breast cancer Maternal Grandmother    Breast cancer Maternal Aunt    Hypertension Other    Cancer Other    Renal Disease Other      Allergies  Allergen Reactions   Morphine And Codeine Hives   Levaquin [Levofloxacin] Itching    Severe itching;  prickly sensation   Enalapril Other (See Comments)    hallucinations   Latex Rash   Tape Rash and Other (See Comments)     REVIEW OF SYSTEMS (Negative unless checked)   Constitutional: [] Weight loss  [] Fever  [] Chills Cardiac: [x] Chest pain   [] Chest pressure   [x] Palpitations   [] Shortness of breath when laying flat   [] Shortness of breath at rest   [x] Shortness of breath with exertion. Vascular:  [] Pain in legs with walking   [] Pain in legs at rest   [] Pain in legs when laying flat   [] Claudication   [] Pain in feet when walking  [] Pain in feet at rest  [] Pain in feet when laying flat   [] History of DVT   [] Phlebitis   [] Swelling in legs   [] Varicose veins   [] Non-healing ulcers Pulmonary:   [] Uses home oxygen   [] Productive cough   [] Hemoptysis   [] Wheeze  [] COPD   [] Asthma Neurologic:  [] Dizziness  [] Blackouts   [] Seizures   [x] History of stroke   [] History of TIA  [] Aphasia   [] Temporary blindness   [] Dysphagia   [] Weakness or numbness in arms   [] Weakness or numbness in legs Musculoskeletal:  [] Arthritis   [] Joint swelling   [] Joint pain   [] Low back pain Hematologic:  [] Easy bruising  [] Easy bleeding   [] Hypercoagulable state   [x] Anemic   Gastrointestinal:  [] Blood in stool   [] Vomiting blood  [] Gastroesophageal reflux/heartburn   [] Abdominal pain Genitourinary:  [x] Chronic kidney disease   [] Difficult urination  [] Frequent urination  [] Burning with urination   [] Hematuria Skin:  [] Rashes   [] Ulcers   [] Wounds Psychological:  [] History of anxiety   []  History of major depression.  Physical Examination  BP 109/71   Pulse 87   Resp 18   Ht 5\' 6"  (1.676 m)   Wt 156 lb (70.8 kg)   BMI 25.18 kg/m  Gen:  WD/WN, NAD Head: [place]/AT, No temporalis wasting. Ear/Nose/Throat: Hearing grossly intact, nares w/o erythema or drainage Eyes: Conjunctiva clear. Sclera non-icteric Neck: Supple.  Trachea midline Pulmonary:  Good air movement, no use of accessory muscles.  Cardiac: RRR, no  JVD Vascular: Excellent thrill was present in the left upper  arm AV fistula/jump graft.fistula/jump graft.  Scarring present from long term access sites Vessel Right Left  Radial Palpable Palpable               Musculoskeletal: M/S 5/5 throughout.  No deformity or atrophy. Using a cane. No edema. Neurologic: Sensation grossly intact in extremities.  Symmetrical.  Speech is fluent.  Psychiatric: Judgment intact, Mood & affect appropriate for pt's clinical situation. Dermatologic: No rashes or ulcers noted.  No cellulitis or open wounds.      Labs No results found for this or any previous visit (from the past 2160 hours).  Radiology VAS US DUPLEX DIALYSIS ACCESS (AVF, AVG) Result Date: 08/26/2023 DIALYSIS ACCESS Patient Name:  [name] [facility]  Date of Exam:   08/26/2023 Medical Rec #: [id number]          Accession #:    [id number] Date of Birth: Feb 26, 1961           Patient Gender: F Patient Age:   84 years Exam Location:  [facility] Procedure:      VAS US DUPLEX DIALYSIS ACCESS (AVF, AVG) Referring Phys: Festus Barren --------------------------------------------------------------------------------  Reason for Exam: Routine follow up. Access Site: Left Upper Extremity. Access Type: Basilic vein transposition. History: 02/11/2023 PTA mid upper arm access site           11/2022          PTA of confluence.          08/20/2022 Percutaneous transluminal angioplasty of left basilic/axillary          junction with 9 mm diameter by 6 cm length Lutonix drug-coated          angioplasty balloon           History of Artegraft jump graft revision. Performing Technologist: Salvadore Farber RVT  Examination Guidelines: A complete evaluation includes B-mode imaging, spectral Doppler, color Doppler, and power Doppler as needed of all accessible portions of each vessel. Unilateral testing is considered an integral part of a complete examination. Limited examinations for reoccurring indications may be performed as  noted.  Findings: +--------------------+----------+-----------------+--------+ AVF                 PSV (cm/s)Flow Vol (mL/min)Comments +--------------------+----------+-----------------+--------+ Native artery inflow    68          1236                +--------------------+----------+-----------------+--------+ AVF Anastomosis        251                              +--------------------+----------+-----------------+--------+  +-------------+----------+-------------+----------+--------+ OUTFLOW VEIN PSV (cm/s)Diameter (cm)Depth (cm)Describe +-------------+----------+-------------+----------+--------+ Axillary vein    90                                    +-------------+----------+-------------+----------+--------+ Confluence      150                                    +-------------+----------+-------------+----------+--------+ Prox UA         123                                    +-------------+----------+-------------+----------+--------+  Mid UA          185                                    +-------------+----------+-------------+----------+--------+ Dist UA         159                                    +-------------+----------+-------------+----------+--------+  +-------------+-------------+----------+---------+---------+-------------------+              Diameter (cm)Depth (cm)Branching   PSV       Flow Volume                                                   (cm/s)       (ml/min)       +-------------+-------------+----------+---------+---------+-------------------+ Lt Rad Art                                      34                        Dis                                                                       +-------------+-------------+----------+---------+---------+-------------------+  Summary: Patent arteriovenous fistula. *See table(s) above for measurements and observations.  Diagnosing physician: Festus Barren MD  Electronically signed by Festus Barren MD on 08/26/2023 at 4:04:25 PM.   --------------------------------------------------------------------------------   Final    MR Brain Wo Contrast Result Date: 08/16/2023 CLINICAL DATA:  Severe headaches for 1 year, weakness and nausea. Fall with head trauma and possible concussion. EXAM: MRI HEAD WITHOUT CONTRAST TECHNIQUE: Multiplanar, multiecho pulse sequences of the brain and surrounding structures were obtained without intravenous contrast. COMPARISON:  CT head 02/16/2022 FINDINGS: Brain: No acute infarct. Redemonstration of multiple foci of encephalomalacia with surrounding gliosis in the bilateral frontal lobes, right parietooccipital lobes, and left parietal lobe. Additional small remote infarcts in the right cerebellum. No evidence of intracranial hemorrhage. Scattered and confluent FLAIR hyperintensity in the periventricular and subcortical white matter suggestive of chronic microvascular ischemic changes. No mass lesion or midline shift. Normal appearance of midline structures. The basilar cisterns are patent. No extra-axial fluid collections. Ventricles: Normal size and configuration of the ventricles. Vascular: Abnormal appearance of the left vertebral artery flow void at the skull base. Skull base flow voids otherwise unremarkable. Skull and upper cervical spine: No focal abnormality. Sinuses/Orbits: Orbits are symmetric. Paranasal sinuses are clear. Other: Mastoid air cells are clear. IMPRESSION: 1. No acute intracranial abnormality. 2. Multiple remote infarcts as above. 3. Abnormal appearance of the left vertebral artery flow void at the skull base. This could be secondary to slow flow or occlusion. Recommend further evaluation with CTA of the head and neck. 4. Moderate chronic microvascular ischemic changes. Electronically Signed   By: Molli Hazard  Hunt M.D.   On: 08/16/2023 12:05    Assessment/Plan  ESRD on hemodialysis [facility]) Access is currently patent and  working well.  Continue to rotate her access sites to avoid wearing the fistula out.  No role for intervention at current.  Recheck in 6 months.  Chronic combined systolic and diastolic CHF (congestive heart failure) (HCC) Managed medically.  Follows with cardiology.   Hypertension blood pressure control important in reducing the progression of atherosclerotic disease. On appropriate oral medications.  Festus Barren, MD  08/31/2023 12:34 PM    This note was created with Dragon medical transcription system.  Any errors from dictation are purely unintentional

## 2023-09-01 DIAGNOSIS — Z992 Dependence on renal dialysis: Secondary | ICD-10-CM | POA: Diagnosis not present

## 2023-09-01 DIAGNOSIS — N186 End stage renal disease: Secondary | ICD-10-CM | POA: Diagnosis not present

## 2023-09-01 DIAGNOSIS — N2581 Secondary hyperparathyroidism of renal origin: Secondary | ICD-10-CM | POA: Diagnosis not present

## 2023-09-03 DIAGNOSIS — N186 End stage renal disease: Secondary | ICD-10-CM | POA: Diagnosis not present

## 2023-09-03 DIAGNOSIS — N2581 Secondary hyperparathyroidism of renal origin: Secondary | ICD-10-CM | POA: Diagnosis not present

## 2023-09-03 DIAGNOSIS — Z992 Dependence on renal dialysis: Secondary | ICD-10-CM | POA: Diagnosis not present

## 2023-09-06 DIAGNOSIS — N186 End stage renal disease: Secondary | ICD-10-CM | POA: Diagnosis not present

## 2023-09-06 DIAGNOSIS — Z992 Dependence on renal dialysis: Secondary | ICD-10-CM | POA: Diagnosis not present

## 2023-09-06 DIAGNOSIS — N2581 Secondary hyperparathyroidism of renal origin: Secondary | ICD-10-CM | POA: Diagnosis not present

## 2023-09-07 ENCOUNTER — Other Ambulatory Visit

## 2023-09-08 DIAGNOSIS — N186 End stage renal disease: Secondary | ICD-10-CM | POA: Diagnosis not present

## 2023-09-08 DIAGNOSIS — Z992 Dependence on renal dialysis: Secondary | ICD-10-CM | POA: Diagnosis not present

## 2023-09-08 DIAGNOSIS — N2581 Secondary hyperparathyroidism of renal origin: Secondary | ICD-10-CM | POA: Diagnosis not present

## 2023-09-10 DIAGNOSIS — N2581 Secondary hyperparathyroidism of renal origin: Secondary | ICD-10-CM | POA: Diagnosis not present

## 2023-09-10 DIAGNOSIS — N186 End stage renal disease: Secondary | ICD-10-CM | POA: Diagnosis not present

## 2023-09-10 DIAGNOSIS — Z992 Dependence on renal dialysis: Secondary | ICD-10-CM | POA: Diagnosis not present

## 2023-09-13 DIAGNOSIS — Z992 Dependence on renal dialysis: Secondary | ICD-10-CM | POA: Diagnosis not present

## 2023-09-13 DIAGNOSIS — N186 End stage renal disease: Secondary | ICD-10-CM | POA: Diagnosis not present

## 2023-09-13 DIAGNOSIS — N2581 Secondary hyperparathyroidism of renal origin: Secondary | ICD-10-CM | POA: Diagnosis not present

## 2023-09-14 ENCOUNTER — Encounter: Payer: Self-pay | Admitting: Cardiovascular Disease

## 2023-09-14 ENCOUNTER — Ambulatory Visit (INDEPENDENT_AMBULATORY_CARE_PROVIDER_SITE_OTHER): Payer: 59 | Admitting: Cardiovascular Disease

## 2023-09-14 VITALS — BP 105/68 | HR 90 | Ht 66.0 in | Wt 152.0 lb

## 2023-09-14 DIAGNOSIS — Z951 Presence of aortocoronary bypass graft: Secondary | ICD-10-CM | POA: Diagnosis not present

## 2023-09-14 DIAGNOSIS — I3139 Other pericardial effusion (noninflammatory): Secondary | ICD-10-CM | POA: Diagnosis not present

## 2023-09-14 DIAGNOSIS — F17218 Nicotine dependence, cigarettes, with other nicotine-induced disorders: Secondary | ICD-10-CM

## 2023-09-14 DIAGNOSIS — Z0130 Encounter for examination of blood pressure without abnormal findings: Secondary | ICD-10-CM

## 2023-09-14 DIAGNOSIS — I480 Paroxysmal atrial fibrillation: Secondary | ICD-10-CM

## 2023-09-14 DIAGNOSIS — I5020 Unspecified systolic (congestive) heart failure: Secondary | ICD-10-CM | POA: Diagnosis not present

## 2023-09-14 DIAGNOSIS — R0602 Shortness of breath: Secondary | ICD-10-CM

## 2023-09-14 NOTE — Progress Notes (Signed)
 Cardiology Office Note   Date:  09/14/2023   ID:  [name], DOB October 11, 1960, MRN [id number]  PCP:  Miki Kins, FNP  Cardiologist:  Adrian Blackwater, MD      History of Present Illness: [name] is a 63 y.o. female who presents for  Chief Complaint  Patient presents with   Follow-up    2 month follow up     No chest pain, occasional SOB.      Past Medical History:  Diagnosis Date   Adult behavior problems    frontal lobe CVA   Anemia    chronic disease   Atrial flutter by electrocardiogram (HCC) 03/22/2014   Cardiomyopathy (HCC)    CHF (congestive heart failure) (HCC)    Chicken pox    Chronic respiratory failure with hypoxia (HCC) 12/19/2021   Coronary artery disease    Delayed surgical wound healing 04/12/2014   Overview:  Left leg   GERD (gastroesophageal reflux disease)    H/O atrial flutter 04/12/2014   H/O stroke without residual deficits 02/13/2014   H/O: substance abuse (HCC) 02/27/2014   Heart failure (HCC)    Hepatitis    history of hep c   History of CVA (cerebrovascular accident) 02/21/2014   History of delirium 04/12/2014   Hyperkalemia 11/13/2014   Hyperlipidemia    Hypertension    Hypotension 02/24/2014   Leukocytosis 02/26/2014   Myocardial infarction [facility])    Obesity    Paroxysmal atrial fibrillation (HCC)    Peripheral vascular disease (HCC)    Postoperative anemia due to acute blood loss 02/27/2014   Renal failure    Renal insufficiency    Stroke [facility]) 2011   Superficial incisional surgical site infection 03/14/2014   Syncope and collapse 07/25/2020     Past Surgical History:  Procedure Laterality Date   A/V FISTULAGRAM Left 03/30/2019   Procedure: A/V FISTULAGRAM;  Surgeon: Annice Needy, MD;  Location: [facility];  Service: Cardiovascular;  Laterality: Left;   A/V FISTULAGRAM Left 10/06/2019   Procedure: A/V FISTULAGRAM;  Surgeon: Annice Needy, MD;  Location: [facility];  Service:  Cardiovascular;  Laterality: Left;   A/V FISTULAGRAM Left 05/19/2021   Procedure: A/V FISTULAGRAM;  Surgeon: Annice Needy, MD;  Location: [facility];  Service: Cardiovascular;  Laterality: Left;   A/V FISTULAGRAM Left 01/29/2022   Procedure: A/V Fistulagram;  Surgeon: Annice Needy, MD;  Location: [facility];  Service: Cardiovascular;  Laterality: Left;   A/V FISTULAGRAM Left 04/30/2022   Procedure: A/V Fistulagram;  Surgeon: Annice Needy, MD;  Location: [facility];  Service: Cardiovascular;  Laterality: Left;   A/V FISTULAGRAM Left 08/20/2022   Procedure: A/V Fistulagram;  Surgeon: Annice Needy, MD;  Location: [facility];  Service: Cardiovascular;  Laterality: Left;   A/V FISTULAGRAM N/A 12/03/2022   Procedure: A/V Fistulagram;  Surgeon: Annice Needy, MD;  Location: [facility];  Service: Cardiovascular;  Laterality: N/A;   A/V FISTULAGRAM Left 02/11/2023   Procedure: A/V Fistulagram;  Surgeon: Annice Needy, MD;  Location: [facility];  Service: Cardiovascular;  Laterality: Left;   COLONOSCOPY     COLONOSCOPY WITH PROPOFOL N/A 04/22/2021   Procedure: COLONOSCOPY WITH PROPOFOL;  Surgeon: Regis Bill, MD;  Location: [facility] ENDOSCOPY;  Service: Endoscopy;  Laterality: N/A;   CORONARY ANGIOPLASTY WITH STENT PLACEMENT  2013   CORONARY ARTERY BYPASS GRAFT  DIALYSIS FISTULA CREATION     DIALYSIS/PERMA CATHETER INSERTION N/A 04/08/2023   Procedure: DIALYSIS/PERMA CATHETER INSERTION;  Surgeon: Annice Needy, MD;  Location: [facility];  Service: Cardiovascular;  Laterality: N/A;   DIALYSIS/PERMA CATHETER REMOVAL N/A 07/06/2023   Procedure: DIALYSIS/PERMA CATHETER REMOVAL;  Surgeon: Renford Dills, MD;  Location: [facility];  Service: Cardiovascular;  Laterality: N/A;   ESOPHAGOGASTRODUODENOSCOPY     ESOPHAGOGASTRODUODENOSCOPY N/A 04/22/2021   Procedure: ESOPHAGOGASTRODUODENOSCOPY (EGD);  Surgeon: Regis Bill, MD;   Location: [facility] ENDOSCOPY;  Service: Endoscopy;  Laterality: N/A;   FLEXIBLE SIGMOIDOSCOPY N/A 02/23/2019   Procedure: FLEXIBLE SIGMOIDOSCOPY;  Surgeon: Christena Deem, MD;  Location: [facility] ENDOSCOPY;  Service: Endoscopy;  Laterality: N/A;   LEFT HEART CATH AND CORS/GRAFTS ANGIOGRAPHY N/A 08/08/2018   Procedure: LEFT HEART CATH AND CORS/GRAFTS ANGIOGRAPHY;  Surgeon: Laurier Nancy, MD;  Location: [facility];  Service: Cardiovascular;  Laterality: N/A;   LEFT HEART CATH AND CORS/GRAFTS ANGIOGRAPHY N/A 01/30/2019   Procedure: LEFT HEART CATH AND CORS/GRAFTS ANGIOGRAPHY;  Surgeon: Laurier Nancy, MD;  Location: [facility];  Service: Cardiovascular;  Laterality: N/A;   percutaneous insertion intra aortic balloon right cath     ultrasound guided pericardiocentesis       Current Outpatient Medications  Medication Sig Dispense Refill   albuterol (VENTOLIN HFA) 108 (90 Base) MCG/ACT inhaler Inhale 1-2 puffs into the lungs every 6 (six) hours as needed for wheezing or shortness of breath. 20.1 g 3   apixaban (ELIQUIS) 5 MG TABS tablet Take 5 mg by mouth 2 (two) times daily.     Bismuth Tribromoph-Petrolatum (XEROFORM PETROLAT PATCH 2"X2") PADS Apply 1 each topically every 3 (three) days. 25 each 0   doxycycline (VIBRAMYCIN) 100 MG capsule Take 1 capsule (100 mg total) by mouth 2 (two) times daily. 20 capsule 0   enalapril (VASOTEC) 2.5 MG tablet Take 2.5 mg by mouth daily.     hydrOXYzine (ATARAX/VISTARIL) 50 MG tablet Take 50 mg by mouth at bedtime.     levothyroxine (SYNTHROID) 75 MCG tablet Take 75 mcg by mouth daily before breakfast.      lidocaine-prilocaine (EMLA) cream Apply 1 Application topically once.     losartan (COZAAR) 25 MG tablet Take 1/2 tab daily 90 tablet 1   metoprolol succinate (TOPROL-XL) 25 MG 24 hr tablet Take 1 tablet by mouth daily.     midodrine (PROAMATINE) 10 MG tablet Take 1 tablet (10 mg total) by mouth 3 (three) times daily. Monday, Wednesday,  Friday with hemodialysis.   Take 1 tablet once daily on non-dialysis days. 90 tablet 0   omeprazole (PRILOSEC) 20 MG capsule Take 20 mg by mouth daily.     oxyCODONE-acetaminophen (PERCOCET/ROXICET) 5-325 MG tablet Take 1 tablet by mouth every 6 (six) hours as needed for severe pain (pain score 7-10). 20 tablet 0   ranolazine (RANEXA) 1000 MG SR tablet Take 1,000 mg by mouth 2 (two) times daily.     rosuvastatin (CRESTOR) 20 MG tablet Take 20 mg by mouth at bedtime.     sevelamer carbonate (RENVELA) 800 MG tablet Take 3,200 mg by mouth 3 (three) times daily with meals.     VELTASSA 8.4 g packet Take 1 packet by mouth daily.     No current facility-administered medications for this visit.   Facility-Administered Medications Ordered in Other Visits  Medication Dose Route Frequency Provider Last Rate Last Admin   heparin injection    PRN  Annice Needy, MD   10,000 Units at 04/08/23 1412    Allergies:   Morphine and codeine, Levaquin [levofloxacin], Enalapril, Latex, and Tape    Social History:   reports that she has been smoking cigarettes. She has a 5 pack-year smoking history. She has never used smokeless tobacco. She reports that she does not drink alcohol and does not use drugs.   Family History:  family history includes Breast cancer in her maternal aunt and maternal grandmother; Cancer in an other family member; Diabetes type II in her maternal grandmother and mother; Hypertension in her father, mother, sister, and another family member; Kidney disease in her father; Ovarian cancer in her mother; Renal Disease in an other family member.    ROS:     Review of Systems  Constitutional: Negative.   HENT: Negative.    Eyes: Negative.   Respiratory: Negative.    Gastrointestinal: Negative.   Genitourinary: Negative.   Musculoskeletal: Negative.   Skin: Negative.   Neurological: Negative.   Endo/Heme/Allergies: Negative.   Psychiatric/Behavioral: Negative.    All other systems reviewed  and are negative.     All other systems are reviewed and negative.    PHYSICAL EXAM: VS:  BP 105/68   Pulse 90   Ht 5\' 6"  (1.676 m)   Wt 152 lb (68.9 kg)   SpO2 95%   BMI 24.53 kg/m  , BMI Body mass index is 24.53 kg/m. Last weight:  Wt Readings from Last 3 Encounters:  09/14/23 152 lb (68.9 kg)  08/31/23 156 lb (70.8 kg)  08/24/23 156 lb (70.8 kg)     Physical Exam Constitutional:      Appearance: Normal appearance.  Cardiovascular:     Rate and Rhythm: Normal rate and regular rhythm.     Heart sounds: Normal heart sounds.  Pulmonary:     Effort: Pulmonary effort is normal.     Breath sounds: Normal breath sounds.  Musculoskeletal:     Right lower leg: No edema.     Left lower leg: No edema.  Neurological:     Mental Status: She is alert.       EKG:   Recent Labs: 05/17/2023: ALT 16 05/19/2023: BUN 59; Creatinine, Ser 8.16; Hemoglobin 11.0; Platelets 128; Potassium 4.7; Sodium 134    Lipid Panel    Component Value Date/Time   CHOL 140 01/18/2019 1358   CHOL 79 06/14/2014 0404   TRIG 148 01/18/2019 1358   TRIG 85 06/14/2014 0404   HDL 43 01/18/2019 1358   HDL 35 (L) 06/14/2014 0404   CHOLHDL 3.3 01/18/2019 1358   VLDL 30 01/18/2019 1358   VLDL 17 06/14/2014 0404   LDLCALC 67 01/18/2019 1358   LDLCALC 27 06/14/2014 0404      Other studies Reviewed: Additional studies/ records that were reviewed today include:  Review of the above records demonstrates:       No data to display            ASSESSMENT AND PLAN:    ICD-10-CM   1. Pericardial effusion  I31.39 PCV ECHOCARDIOGRAM COMPLETE   will repeat echo    2. HFrEF (heart failure with reduced ejection fraction) (HCC)  I50.20 PCV ECHOCARDIOGRAM COMPLETE    3. Paroxysmal atrial fibrillation (HCC)  I48.0 PCV ECHOCARDIOGRAM COMPLETE    4. S/P CABG x 3  Z95.1 PCV ECHOCARDIOGRAM COMPLETE    5. SOB (shortness of breath)  R06.02 PCV ECHOCARDIOGRAM COMPLETE       Problem List  Items  Addressed This Visit       Cardiovascular and Mediastinum   HFrEF (heart failure with reduced ejection fraction) (HCC) (Chronic)   Relevant Orders   PCV ECHOCARDIOGRAM COMPLETE   Pericardial effusion - Primary   Relevant Orders   PCV ECHOCARDIOGRAM COMPLETE   Paroxysmal atrial fibrillation (HCC)   Relevant Orders   PCV ECHOCARDIOGRAM COMPLETE     Other   S/P CABG x 3   Relevant Orders   PCV ECHOCARDIOGRAM COMPLETE   Other Visit Diagnoses       SOB (shortness of breath)       Relevant Orders   PCV ECHOCARDIOGRAM COMPLETE          Disposition:   Return in about 4 weeks (around 10/12/2023) for echo, and f/u.    Total time spent: 30 minutes  Signed,  Adrian Blackwater, MD  09/14/2023 9:47 AM    [facility]

## 2023-09-15 DIAGNOSIS — N186 End stage renal disease: Secondary | ICD-10-CM | POA: Diagnosis not present

## 2023-09-15 DIAGNOSIS — N2581 Secondary hyperparathyroidism of renal origin: Secondary | ICD-10-CM | POA: Diagnosis not present

## 2023-09-15 DIAGNOSIS — Z992 Dependence on renal dialysis: Secondary | ICD-10-CM | POA: Diagnosis not present

## 2023-09-17 DIAGNOSIS — N2581 Secondary hyperparathyroidism of renal origin: Secondary | ICD-10-CM | POA: Diagnosis not present

## 2023-09-17 DIAGNOSIS — Z992 Dependence on renal dialysis: Secondary | ICD-10-CM | POA: Diagnosis not present

## 2023-09-17 DIAGNOSIS — N186 End stage renal disease: Secondary | ICD-10-CM | POA: Diagnosis not present

## 2023-09-20 DIAGNOSIS — N2581 Secondary hyperparathyroidism of renal origin: Secondary | ICD-10-CM | POA: Diagnosis not present

## 2023-09-20 DIAGNOSIS — Z992 Dependence on renal dialysis: Secondary | ICD-10-CM | POA: Diagnosis not present

## 2023-09-20 DIAGNOSIS — N186 End stage renal disease: Secondary | ICD-10-CM | POA: Diagnosis not present

## 2023-09-22 DIAGNOSIS — N186 End stage renal disease: Secondary | ICD-10-CM | POA: Diagnosis not present

## 2023-09-22 DIAGNOSIS — N2581 Secondary hyperparathyroidism of renal origin: Secondary | ICD-10-CM | POA: Diagnosis not present

## 2023-09-22 DIAGNOSIS — Z992 Dependence on renal dialysis: Secondary | ICD-10-CM | POA: Diagnosis not present

## 2023-09-24 DIAGNOSIS — Z992 Dependence on renal dialysis: Secondary | ICD-10-CM | POA: Diagnosis not present

## 2023-09-24 DIAGNOSIS — N186 End stage renal disease: Secondary | ICD-10-CM | POA: Diagnosis not present

## 2023-09-24 DIAGNOSIS — I509 Heart failure, unspecified: Secondary | ICD-10-CM | POA: Diagnosis not present

## 2023-09-24 DIAGNOSIS — N2581 Secondary hyperparathyroidism of renal origin: Secondary | ICD-10-CM | POA: Diagnosis not present

## 2023-09-26 ENCOUNTER — Encounter: Payer: Self-pay | Admitting: Family

## 2023-09-27 DIAGNOSIS — N186 End stage renal disease: Secondary | ICD-10-CM | POA: Diagnosis not present

## 2023-09-27 DIAGNOSIS — Z992 Dependence on renal dialysis: Secondary | ICD-10-CM | POA: Diagnosis not present

## 2023-09-27 DIAGNOSIS — N2581 Secondary hyperparathyroidism of renal origin: Secondary | ICD-10-CM | POA: Diagnosis not present

## 2023-09-28 ENCOUNTER — Other Ambulatory Visit

## 2023-09-29 DIAGNOSIS — N2581 Secondary hyperparathyroidism of renal origin: Secondary | ICD-10-CM | POA: Diagnosis not present

## 2023-09-29 DIAGNOSIS — Z992 Dependence on renal dialysis: Secondary | ICD-10-CM | POA: Diagnosis not present

## 2023-09-29 DIAGNOSIS — N186 End stage renal disease: Secondary | ICD-10-CM | POA: Diagnosis not present

## 2023-09-30 ENCOUNTER — Ambulatory Visit
Admission: RE | Admit: 2023-09-30 | Discharge: 2023-09-30 | Disposition: A | Discharge status: Home / Self Care | Source: Ambulatory Visit | Attending: Family | Admitting: Family

## 2023-09-30 DIAGNOSIS — R9089 Other abnormal findings on diagnostic imaging of central nervous system: Secondary | ICD-10-CM

## 2023-09-30 DIAGNOSIS — R519 Headache, unspecified: Secondary | ICD-10-CM | POA: Diagnosis not present

## 2023-09-30 DIAGNOSIS — G4452 New daily persistent headache (NDPH): Secondary | ICD-10-CM

## 2023-09-30 MED ORDER — IOPAMIDOL (ISOVUE-370) INJECTION 76%
75.0000 mL | Freq: Once | INTRAVENOUS | Status: AC | PRN
  Administered 2023-09-30: 75 mL via INTRAVENOUS

## 2023-10-01 DIAGNOSIS — N2581 Secondary hyperparathyroidism of renal origin: Secondary | ICD-10-CM | POA: Diagnosis not present

## 2023-10-01 DIAGNOSIS — Z992 Dependence on renal dialysis: Secondary | ICD-10-CM | POA: Diagnosis not present

## 2023-10-01 DIAGNOSIS — N186 End stage renal disease: Secondary | ICD-10-CM | POA: Diagnosis not present

## 2023-10-04 ENCOUNTER — Telehealth: Payer: Self-pay | Admitting: Family

## 2023-10-04 DIAGNOSIS — Z992 Dependence on renal dialysis: Secondary | ICD-10-CM | POA: Diagnosis not present

## 2023-10-04 DIAGNOSIS — N2581 Secondary hyperparathyroidism of renal origin: Secondary | ICD-10-CM | POA: Diagnosis not present

## 2023-10-04 DIAGNOSIS — N186 End stage renal disease: Secondary | ICD-10-CM | POA: Diagnosis not present

## 2023-10-04 NOTE — Telephone Encounter (Signed)
 Patient left VM requesting an allergy relief pill be sent in to the pharmacy. Please advise.

## 2023-10-06 DIAGNOSIS — N2581 Secondary hyperparathyroidism of renal origin: Secondary | ICD-10-CM | POA: Diagnosis not present

## 2023-10-06 DIAGNOSIS — Z992 Dependence on renal dialysis: Secondary | ICD-10-CM | POA: Diagnosis not present

## 2023-10-06 DIAGNOSIS — N186 End stage renal disease: Secondary | ICD-10-CM | POA: Diagnosis not present

## 2023-10-07 ENCOUNTER — Other Ambulatory Visit

## 2023-10-08 DIAGNOSIS — Z992 Dependence on renal dialysis: Secondary | ICD-10-CM | POA: Diagnosis not present

## 2023-10-08 DIAGNOSIS — N2581 Secondary hyperparathyroidism of renal origin: Secondary | ICD-10-CM | POA: Diagnosis not present

## 2023-10-08 DIAGNOSIS — N186 End stage renal disease: Secondary | ICD-10-CM | POA: Diagnosis not present

## 2023-10-11 ENCOUNTER — Encounter: Payer: Self-pay | Admitting: Family

## 2023-10-11 ENCOUNTER — Ambulatory Visit (INDEPENDENT_AMBULATORY_CARE_PROVIDER_SITE_OTHER): Admitting: Family

## 2023-10-11 VITALS — BP 108/68 | HR 55 | Ht 66.0 in | Wt 154.4 lb

## 2023-10-11 DIAGNOSIS — I872 Venous insufficiency (chronic) (peripheral): Secondary | ICD-10-CM | POA: Diagnosis not present

## 2023-10-11 DIAGNOSIS — I739 Peripheral vascular disease, unspecified: Secondary | ICD-10-CM | POA: Diagnosis not present

## 2023-10-11 DIAGNOSIS — I10 Essential (primary) hypertension: Secondary | ICD-10-CM

## 2023-10-11 DIAGNOSIS — M7989 Other specified soft tissue disorders: Secondary | ICD-10-CM | POA: Diagnosis not present

## 2023-10-11 DIAGNOSIS — E538 Deficiency of other specified B group vitamins: Secondary | ICD-10-CM

## 2023-10-11 DIAGNOSIS — R7303 Prediabetes: Secondary | ICD-10-CM

## 2023-10-11 DIAGNOSIS — E039 Hypothyroidism, unspecified: Secondary | ICD-10-CM | POA: Diagnosis not present

## 2023-10-11 DIAGNOSIS — E785 Hyperlipidemia, unspecified: Secondary | ICD-10-CM | POA: Diagnosis not present

## 2023-10-11 DIAGNOSIS — E559 Vitamin D deficiency, unspecified: Secondary | ICD-10-CM | POA: Diagnosis not present

## 2023-10-11 DIAGNOSIS — N186 End stage renal disease: Secondary | ICD-10-CM | POA: Diagnosis not present

## 2023-10-11 DIAGNOSIS — N2581 Secondary hyperparathyroidism of renal origin: Secondary | ICD-10-CM | POA: Diagnosis not present

## 2023-10-11 DIAGNOSIS — K7469 Other cirrhosis of liver: Secondary | ICD-10-CM | POA: Diagnosis not present

## 2023-10-11 DIAGNOSIS — Z992 Dependence on renal dialysis: Secondary | ICD-10-CM | POA: Diagnosis not present

## 2023-10-11 NOTE — Progress Notes (Signed)
 Established Patient Office Visit  Subjective:  Patient ID: [name], female    DOB: 01-24-1961  Age: 63 y.o. MRN: [id number]  Chief Complaint  Patient presents with   Leg Pain    Pain in feet, legs Itching and had bumps this weekend.   She has tried lotion and vaseline over but that has not helped with the itching.   Needs refill for  her pain meds.   Allergy meds    No other concerns at this time.   Past Medical History:  Diagnosis Date   Adult behavior problems    frontal lobe CVA   Anemia    chronic disease   Atrial flutter by electrocardiogram (HCC) 03/22/2014   Cardiomyopathy (HCC)    CHF (congestive heart failure) (HCC)    Chicken pox    Chronic respiratory failure with hypoxia (HCC) 12/19/2021   Coronary artery disease    Delayed surgical wound healing 04/12/2014   Overview:  Left leg   GERD (gastroesophageal reflux disease)    H/O atrial flutter 04/12/2014   H/O stroke without residual deficits 02/13/2014   H/O: substance abuse (HCC) 02/27/2014   Heart failure (HCC)    Hepatitis    history of hep c   History of CVA (cerebrovascular accident) 02/21/2014   History of delirium 04/12/2014   Hyperkalemia 11/13/2014   Hyperlipidemia    Hypertension    Hypotension 02/24/2014   Leukocytosis 02/26/2014   Myocardial infarction [facility])    Obesity    Paroxysmal atrial fibrillation (HCC)    Peripheral vascular disease (HCC)    Postoperative anemia due to acute blood loss 02/27/2014   Renal failure    Renal insufficiency    Stroke [facility]) 2011   Superficial incisional surgical site infection 03/14/2014   Syncope and collapse 07/25/2020    Past Surgical History:  Procedure Laterality Date   A/V FISTULAGRAM Left 03/30/2019   Procedure: A/V FISTULAGRAM;  Surgeon: Marea Selinda RAMAN, MD;  Location: [facility];  Service: Cardiovascular;  Laterality: Left;   A/V FISTULAGRAM Left 10/06/2019   Procedure: A/V FISTULAGRAM;  Surgeon: Marea Selinda RAMAN, MD;   Location: [facility];  Service: Cardiovascular;  Laterality: Left;   A/V FISTULAGRAM Left 05/19/2021   Procedure: A/V FISTULAGRAM;  Surgeon: Marea Selinda RAMAN, MD;  Location: [facility];  Service: Cardiovascular;  Laterality: Left;   A/V FISTULAGRAM Left 01/29/2022   Procedure: A/V Fistulagram;  Surgeon: Marea Selinda RAMAN, MD;  Location: [facility];  Service: Cardiovascular;  Laterality: Left;   A/V FISTULAGRAM Left 04/30/2022   Procedure: A/V Fistulagram;  Surgeon: Marea Selinda RAMAN, MD;  Location: [facility];  Service: Cardiovascular;  Laterality: Left;   A/V FISTULAGRAM Left 08/20/2022   Procedure: A/V Fistulagram;  Surgeon: Marea Selinda RAMAN, MD;  Location: [facility];  Service: Cardiovascular;  Laterality: Left;   A/V FISTULAGRAM N/A 12/03/2022   Procedure: A/V Fistulagram;  Surgeon: Marea Selinda RAMAN, MD;  Location: [facility];  Service: Cardiovascular;  Laterality: N/A;   A/V FISTULAGRAM Left 02/11/2023   Procedure: A/V Fistulagram;  Surgeon: Marea Selinda RAMAN, MD;  Location: [facility];  Service: Cardiovascular;  Laterality: Left;   COLONOSCOPY     COLONOSCOPY WITH PROPOFOL  N/A 04/22/2021   Procedure: COLONOSCOPY WITH PROPOFOL ;  Surgeon: Maryruth Ole DASEN, MD;  Location: [facility] ENDOSCOPY;  Service: Endoscopy;  Laterality: N/A;   CORONARY ANGIOPLASTY WITH STENT PLACEMENT  2013   CORONARY ARTERY  BYPASS GRAFT     DIALYSIS FISTULA CREATION     DIALYSIS/PERMA CATHETER INSERTION N/A 04/08/2023   Procedure: DIALYSIS/PERMA CATHETER INSERTION;  Surgeon: Marea Selinda RAMAN, MD;  Location: [facility];  Service: Cardiovascular;  Laterality: N/A;   DIALYSIS/PERMA CATHETER REMOVAL N/A 07/06/2023   Procedure: DIALYSIS/PERMA CATHETER REMOVAL;  Surgeon: [name] Cordella MATSU, MD;  Location: [facility];  Service: Cardiovascular;  Laterality: N/A;   ESOPHAGOGASTRODUODENOSCOPY     ESOPHAGOGASTRODUODENOSCOPY N/A 04/22/2021   Procedure: ESOPHAGOGASTRODUODENOSCOPY  (EGD);  Surgeon: Maryruth Ole DASEN, MD;  Location[facility] ENDOSCOPY;  Service: Endoscopy;  Laterality: N/A;   FLEXIBLE SIGMOIDOSCOPY N/A 02/23/2019   Procedure: FLEXIBLE SIGMOIDOSCOPY;  Surgeon: Gaylyn Gladis PENNER, MD;  Location: [facility] ENDOSCOPY;  Service: Endoscopy;  Laterality: N/A;   LEFT HEART CATH AND CORS/GRAFTS ANGIOGRAPHY N/A 08/08/2018   Procedure: LEFT HEART CATH AND CORS/GRAFTS ANGIOGRAPHY;  Surgeon: Fernand Denyse LABOR, MD;  Location: [facility];  Service: Cardiovascular;  Laterality: N/A;   LEFT HEART CATH AND CORS/GRAFTS ANGIOGRAPHY N/A 01/30/2019   Procedure: LEFT HEART CATH AND CORS/GRAFTS ANGIOGRAPHY;  Surgeon: Fernand Denyse LABOR, MD;  Location: [facility];  Service: Cardiovascular;  Laterality: N/A;   percutaneous insertion intra aortic balloon right cath     ultrasound guided pericardiocentesis      Social History   Socioeconomic History   Marital status: Single    Spouse name: Not on file   Number of children: Not on file   Years of education: Not on file   Highest education level: Not on file  Occupational History   Not on file  Tobacco Use   Smoking status: Some Days    Current packs/day: 0.25    Average packs/day: 0.3 packs/day for 20.0 years (5.0 ttl pk-yrs)    Types: Cigarettes   Smokeless tobacco: Never   Tobacco comments:    smokes two cigarettes a day  Vaping Use   Vaping status: Never Used  Substance and Sexual Activity   Alcohol use: No   Drug use: No   Sexual activity: Not Currently    Birth control/protection: Post-menopausal  Other Topics Concern   Not on file  Social History Narrative   Not on file   Social Drivers of Health   Financial Resource Strain: Not on file  Food Insecurity: No Food Insecurity (05/18/2023)   Hunger Vital Sign    Worried About Running Out of Food in the Last Year: Never true    Ran Out of Food in the Last Year: Never true  Transportation Needs: No Transportation Needs (05/18/2023)   PRAPARE -  Administrator, Civil Service (Medical): No    Lack of Transportation (Non-Medical): No  Physical Activity: Not on file  Stress: Not on file  Social Connections: Not on file  Intimate Partner Violence: Not At Risk (05/18/2023)   Humiliation, Afraid, Rape, and Kick questionnaire    Fear of Current or Ex-Partner: No    Emotionally Abused: No    Physically Abused: No    Sexually Abused: No    Family History  Problem Relation Age of Onset   Hypertension Mother    Ovarian cancer Mother    Diabetes type II Mother    Hypertension Father    Kidney disease Father    Hypertension Sister    Diabetes type II Maternal Grandmother    Breast cancer Maternal Grandmother    Breast cancer Maternal Aunt    Hypertension Other    Cancer  Other    Renal Disease Other     Allergies  Allergen Reactions   Morphine  And Codeine Hives   Levaquin [Levofloxacin] Itching    Severe itching; prickly sensation   Enalapril  Other (See Comments)    hallucinations   Latex Rash   Tape Rash and Other (See Comments)    Review of Systems  Cardiovascular:  Positive for leg swelling.  Musculoskeletal:  Positive for joint pain.  All other systems reviewed and are negative.      Objective:   BP 108/68   Pulse (!) 55   Ht 5' 6 (1.676 m)   Wt 154 lb 6.4 oz (70 kg)   SpO2 99%   BMI 24.92 kg/m   Vitals:   10/11/23 1120  BP: 108/68  Pulse: (!) 55  Height: 5' 6 (1.676 m)  Weight: 154 lb 6.4 oz (70 kg)  SpO2: 99%  BMI (Calculated): 24.93    Physical Exam Vitals and nursing note reviewed.  Constitutional:      Appearance: Normal appearance. She is normal weight.  HENT:     Head: Normocephalic.  Eyes:     Extraocular Movements: Extraocular movements intact.     Conjunctiva/sclera: Conjunctivae normal.     Pupils: Pupils are equal, round, and reactive to light.  Cardiovascular:     Rate and Rhythm: Normal rate.  Pulmonary:     Effort: Pulmonary effort is normal.  Neurological:      General: No focal deficit present.     Mental Status: She is alert and oriented to person, place, and time. Mental status is at baseline.  Psychiatric:        Mood and Affect: Mood normal.        Behavior: Behavior normal.        Thought Content: Thought content normal.        Judgment: Judgment normal.      No results found for any visits on 10/11/23.  No results found for this or any previous visit (from the past 2160 hours).     Assessment & Plan Primary hypertension Blood pressure well controlled with current medications.  Continue current therapy.  Will reassess at follow up.   - CBC w/Diff - CMP w/eGFR  Other cirrhosis of liver (HCC) Patient stable.  Well controlled with current therapy.   Continue current meds.   Hyperlipidemia, unspecified hyperlipidemia type Checking labs today.  Continue current therapy for lipid control. Will modify as needed based on labwork results.   -CMP w/eGFR -Lipid Panel  Vitamin D deficiency Hypothyroidism, unspecified type B12 deficiency due to diet Checking labs today.  Will continue supplements as needed.   - Vitamin D - Vitamin B12 - TSH  Prediabetes A1C Continues to be in prediabetic ranges.  Will reassess at follow up after next lab check.  Patient counseled on dietary choices and verbalized understanding.   -CBC w/Diff -CMP w/eGFR -Hemoglobin A1C  Localized swelling of lower extremity Intermittent claudication (HCC) Venous stasis dermatitis of both lower extremities Will get US  of legs bilaterally. (Arterial and venous).  Will call pt with results when available.   Consider referral to vascular.     Return in about 1 month (around 11/10/2023).   Total time spent: 20 minutes  ALAN CHRISTELLA ARRANT, FNP  10/11/2023   This document may have been prepared by [facility] Voice Recognition software and as such may include unintentional dictation errors.

## 2023-10-12 ENCOUNTER — Ambulatory Visit: Admitting: Cardiovascular Disease

## 2023-10-12 ENCOUNTER — Ambulatory Visit

## 2023-10-12 DIAGNOSIS — R0602 Shortness of breath: Secondary | ICD-10-CM

## 2023-10-12 DIAGNOSIS — I371 Nonrheumatic pulmonary valve insufficiency: Secondary | ICD-10-CM

## 2023-10-12 DIAGNOSIS — I340 Nonrheumatic mitral (valve) insufficiency: Secondary | ICD-10-CM

## 2023-10-12 DIAGNOSIS — I361 Nonrheumatic tricuspid (valve) insufficiency: Secondary | ICD-10-CM | POA: Diagnosis not present

## 2023-10-12 DIAGNOSIS — I5020 Unspecified systolic (congestive) heart failure: Secondary | ICD-10-CM

## 2023-10-12 DIAGNOSIS — Z951 Presence of aortocoronary bypass graft: Secondary | ICD-10-CM

## 2023-10-12 DIAGNOSIS — I3139 Other pericardial effusion (noninflammatory): Secondary | ICD-10-CM

## 2023-10-12 DIAGNOSIS — I480 Paroxysmal atrial fibrillation: Secondary | ICD-10-CM

## 2023-10-12 MED ORDER — ONDANSETRON 4 MG PO TBDP: 4.0000 mg | ORAL_TABLET | Freq: Three times a day (TID) | ORAL | 0 refills | Status: DC | PRN

## 2023-10-12 MED ORDER — TRIAMCINOLONE ACETONIDE 0.1 % EX CREA: 1.0000 | TOPICAL_CREAM | Freq: Two times a day (BID) | CUTANEOUS | 0 refills | Status: DC

## 2023-10-12 MED ORDER — OXYCODONE-ACETAMINOPHEN 5-325 MG PO TABS: 1.0000 | ORAL_TABLET | Freq: Four times a day (QID) | ORAL | 0 refills | Status: DC | PRN

## 2023-10-12 MED ORDER — FEXOFENADINE HCL 180 MG PO TABS: 180.0000 mg | ORAL_TABLET | Freq: Every day | ORAL | 1 refills | Status: DC

## 2023-10-13 DIAGNOSIS — N2581 Secondary hyperparathyroidism of renal origin: Secondary | ICD-10-CM | POA: Diagnosis not present

## 2023-10-13 DIAGNOSIS — N186 End stage renal disease: Secondary | ICD-10-CM | POA: Diagnosis not present

## 2023-10-13 DIAGNOSIS — Z992 Dependence on renal dialysis: Secondary | ICD-10-CM | POA: Diagnosis not present

## 2023-10-15 ENCOUNTER — Encounter: Payer: Self-pay | Admitting: Cardiovascular Disease

## 2023-10-15 ENCOUNTER — Ambulatory Visit (INDEPENDENT_AMBULATORY_CARE_PROVIDER_SITE_OTHER): Admitting: Cardiovascular Disease

## 2023-10-15 VITALS — BP 103/60 | HR 87 | Ht 66.0 in | Wt 150.4 lb

## 2023-10-15 DIAGNOSIS — I5020 Unspecified systolic (congestive) heart failure: Secondary | ICD-10-CM

## 2023-10-15 DIAGNOSIS — F17210 Nicotine dependence, cigarettes, uncomplicated: Secondary | ICD-10-CM

## 2023-10-15 DIAGNOSIS — R0602 Shortness of breath: Secondary | ICD-10-CM | POA: Diagnosis not present

## 2023-10-15 DIAGNOSIS — N186 End stage renal disease: Secondary | ICD-10-CM | POA: Diagnosis not present

## 2023-10-15 DIAGNOSIS — E785 Hyperlipidemia, unspecified: Secondary | ICD-10-CM

## 2023-10-15 DIAGNOSIS — Z992 Dependence on renal dialysis: Secondary | ICD-10-CM | POA: Diagnosis not present

## 2023-10-15 DIAGNOSIS — I480 Paroxysmal atrial fibrillation: Secondary | ICD-10-CM

## 2023-10-15 DIAGNOSIS — I10 Essential (primary) hypertension: Secondary | ICD-10-CM | POA: Diagnosis not present

## 2023-10-15 DIAGNOSIS — Z951 Presence of aortocoronary bypass graft: Secondary | ICD-10-CM | POA: Diagnosis not present

## 2023-10-15 DIAGNOSIS — N2581 Secondary hyperparathyroidism of renal origin: Secondary | ICD-10-CM | POA: Diagnosis not present

## 2023-10-15 MED ORDER — ENALAPRIL MALEATE 2.5 MG PO TABS: 2.5000 mg | ORAL_TABLET | Freq: Every day | ORAL | 2 refills | Status: DC

## 2023-10-15 NOTE — Progress Notes (Signed)
 Cardiology Office Note   Date:  10/15/2023   ID:  [name], DOB 1960-07-29, MRN [id number]  PCP:  Trenda Frisk, FNP  Cardiologist:  Debborah Fairly, MD      History of Present Illness: [name] is a 63 y.o. female who presents for  Chief Complaint  Patient presents with   Follow-up    4 week echo results    Doing good      Past Medical History:  Diagnosis Date   Adult behavior problems    frontal lobe CVA   Anemia    chronic disease   Atrial flutter by electrocardiogram (HCC) 03/22/2014   Cardiomyopathy (HCC)    CHF (congestive heart failure) (HCC)    Chicken pox    Chronic respiratory failure with hypoxia (HCC) 12/19/2021   Coronary artery disease    Delayed surgical wound healing 04/12/2014   Overview:  Left leg   GERD (gastroesophageal reflux disease)    H/O atrial flutter 04/12/2014   H/O stroke without residual deficits 02/13/2014   H/O: substance abuse (HCC) 02/27/2014   Heart failure (HCC)    Hepatitis    history of hep c   History of CVA (cerebrovascular accident) 02/21/2014   History of delirium 04/12/2014   Hyperkalemia 11/13/2014   Hyperlipidemia    Hypertension    Hypotension 02/24/2014   Leukocytosis 02/26/2014   Myocardial infarction [facility])    Obesity    Paroxysmal atrial fibrillation (HCC)    Peripheral vascular disease (HCC)    Postoperative anemia due to acute blood loss 02/27/2014   Renal failure    Renal insufficiency    Stroke [facility]) 2011   Superficial incisional surgical site infection 03/14/2014   Syncope and collapse 07/25/2020     Past Surgical History:  Procedure Laterality Date   A/V FISTULAGRAM Left 03/30/2019   Procedure: A/V FISTULAGRAM;  Surgeon: Celso College, MD;  Location: [facility] INV[facility]IVE CV LAB;  Service: Cardiovascular;  Laterality: Left;   A/V FISTULAGRAM Left 10/06/2019   Procedure: A/V FISTULAGRAM;  Surgeon: Celso College, MD;  Location: [facility];  Service: Cardiovascular;   Laterality: Left;   A/V FISTULAGRAM Left 05/19/2021   Procedure: A/V FISTULAGRAM;  Surgeon: Celso College, MD;  Location: [facility];  Service: Cardiovascular;  Laterality: Left;   A/V FISTULAGRAM Left 01/29/2022   Procedure: A/V Fistulagram;  Surgeon: Celso College, MD;  Location: [facility];  Service: Cardiovascular;  Laterality: Left;   A/V FISTULAGRAM Left 04/30/2022   Procedure: A/V Fistulagram;  Surgeon: Celso College, MD;  Location: [facility];  Service: Cardiovascular;  Laterality: Left;   A/V FISTULAGRAM Left 08/20/2022   Procedure: A/V Fistulagram;  Surgeon: Celso College, MD;  Location: [facility];  Service: Cardiovascular;  Laterality: Left;   A/V FISTULAGRAM N/A 12/03/2022   Procedure: A/V Fistulagram;  Surgeon: Celso College, MD;  Location: [facility];  Service: Cardiovascular;  Laterality: N/A;   A/V FISTULAGRAM Left 02/11/2023   Procedure: A/V Fistulagram;  Surgeon: Celso College, MD;  Location: [facility];  Service: Cardiovascular;  Laterality: Left;   COLONOSCOPY     COLONOSCOPY WITH PROPOFOL  N/A 04/22/2021   Procedure: COLONOSCOPY WITH PROPOFOL ;  Surgeon: Shane Darling, MD;  Location: [facility] ENDOSCOPY;  Service: Endoscopy;  Laterality: N/A;   CORONARY ANGIOPLASTY WITH STENT PLACEMENT  2013   CORONARY ARTERY BYPASS GRAFT     DIALYSIS FISTULA  CREATION     DIALYSIS/PERMA CATHETER INSERTION N/A 04/08/2023   Procedure: DIALYSIS/PERMA CATHETER INSERTION;  Surgeon: Celso College, MD;  Location: [facility];  Service: Cardiovascular;  Laterality: N/A;   DIALYSIS/PERMA CATHETER REMOVAL N/A 07/06/2023   Procedure: DIALYSIS/PERMA CATHETER REMOVAL;  Surgeon: Jackquelyn Mass, MD;  Location: [facility];  Service: Cardiovascular;  Laterality: N/A;   ESOPHAGOGASTRODUODENOSCOPY     ESOPHAGOGASTRODUODENOSCOPY N/A 04/22/2021   Procedure: ESOPHAGOGASTRODUODENOSCOPY (EGD);  Surgeon: Shane Darling, MD;  Location[facility]  ENDOSCOPY;  Service: Endoscopy;  Laterality: N/A;   FLEXIBLE SIGMOIDOSCOPY N/A 02/23/2019   Procedure: FLEXIBLE SIGMOIDOSCOPY;  Surgeon: Deveron Fly, MD;  Location: [facility] ENDOSCOPY;  Service: Endoscopy;  Laterality: N/A;   LEFT HEART CATH AND CORS/GRAFTS ANGIOGRAPHY N/A 08/08/2018   Procedure: LEFT HEART CATH AND CORS/GRAFTS ANGIOGRAPHY;  Surgeon: Cherrie Cornwall, MD;  Location: [facility];  Service: Cardiovascular;  Laterality: N/A;   LEFT HEART CATH AND CORS/GRAFTS ANGIOGRAPHY N/A 01/30/2019   Procedure: LEFT HEART CATH AND CORS/GRAFTS ANGIOGRAPHY;  Surgeon: Cherrie Cornwall, MD;  Location: [facility];  Service: Cardiovascular;  Laterality: N/A;   percutaneous insertion intra aortic balloon right cath     ultrasound guided pericardiocentesis       Current Outpatient Medications  Medication Sig Dispense Refill   albuterol  (VENTOLIN  HFA) 108 (90 Base) MCG/ACT inhaler Inhale 1-2 puffs into the lungs every 6 (six) hours as needed for wheezing or shortness of breath. 20.1 g 3   apixaban  (ELIQUIS ) 5 MG TABS tablet Take 5 mg by mouth 2 (two) times daily.     Bismuth Tribromoph-Petrolatum (XEROFORM PETROLAT PATCH 2"X2") PADS Apply 1 each topically every 3 (three) days. 25 each 0   fexofenadine  (ALLEGRA ) 180 MG tablet Take 1 tablet (180 mg total) by mouth daily. 90 tablet 1   hydrOXYzine  (ATARAX /VISTARIL ) 50 MG tablet Take 50 mg by mouth at bedtime.     levothyroxine  (SYNTHROID ) 75 MCG tablet Take 75 mcg by mouth daily before breakfast.      lidocaine -prilocaine  (EMLA ) cream Apply 1 Application topically once.     metoprolol  succinate (TOPROL -XL) 25 MG 24 hr tablet Take 1 tablet by mouth daily.     midodrine  (PROAMATINE ) 10 MG tablet Take 1 tablet (10 mg total) by mouth 3 (three) times daily. Monday, Wednesday, Friday with hemodialysis.   Take 1 tablet once daily on non-dialysis days. 90 tablet 0   omeprazole (PRILOSEC) 20 MG capsule Take 20 mg by mouth daily.      ondansetron  (ZOFRAN -ODT) 4 MG disintegrating tablet Take 1 tablet (4 mg total) by mouth every 8 (eight) hours as needed for nausea or vomiting. 20 tablet 0   oxyCODONE -acetaminophen  (PERCOCET/ROXICET) 5-325 MG tablet Take 1 tablet by mouth every 6 (six) hours as needed for severe pain (pain score 7-10). 20 tablet 0   ranolazine  (RANEXA ) 1000 MG SR tablet Take 1,000 mg by mouth 2 (two) times daily.     rosuvastatin  (CRESTOR ) 20 MG tablet Take 20 mg by mouth at bedtime.     sevelamer  carbonate (RENVELA ) 800 MG tablet Take 3,200 mg by mouth 3 (three) times daily with meals.     triamcinolone  cream (KENALOG ) 0.1 % Apply 1 Application topically 2 (two) times daily. 30 g 0   VELTASSA  8.4 g packet Take 1 packet by mouth daily.     doxycycline  (VIBRAMYCIN ) 100 MG capsule Take 1 capsule (100 mg total) by mouth 2 (two) times daily. (Patient not taking:  Reported on 10/15/2023) 20 capsule 0   enalapril  (VASOTEC ) 2.5 MG tablet Take 1 tablet (2.5 mg total) by mouth daily. 30 tablet 2   No current facility-administered medications for this visit.   Facility-Administered Medications Ordered in Other Visits  Medication Dose Route Frequency Provider Last Rate Last Admin   heparin  injection    PRN Dew, Jason S, MD   10,000 Units at 04/08/23 1412    Allergies:   Morphine  and codeine, Levaquin [levofloxacin], Enalapril , Latex, and Tape    Social History:   reports that she has been smoking cigarettes. She has a 5 pack-year smoking history. She has never used smokeless tobacco. She reports that she does not drink alcohol and does not use drugs.   Family History:  family history includes Breast cancer in her maternal aunt and maternal grandmother; Cancer in an other family member; Diabetes type II in her maternal grandmother and mother; Hypertension in her father, mother, sister, and another family member; Kidney disease in her father; Ovarian cancer in her mother; Renal Disease in an other family member.    ROS:      Review of Systems  Constitutional: Negative.   HENT: Negative.    Eyes: Negative.   Respiratory: Negative.    Gastrointestinal: Negative.   Genitourinary: Negative.   Musculoskeletal: Negative.   Skin: Negative.   Neurological: Negative.   Endo/Heme/Allergies: Negative.   Psychiatric/Behavioral: Negative.    All other systems reviewed and are negative.     All other systems are reviewed and negative.    PHYSICAL EXAM: VS:  BP 103/60   Pulse 87   Ht 5\' 6"  (1.676 m)   Wt 150 lb 6.4 oz (68.2 kg)   SpO2 94%   BMI 24.28 kg/m  , BMI Body mass index is 24.28 kg/m. Last weight:  Wt Readings from Last 3 Encounters:  10/15/23 150 lb 6.4 oz (68.2 kg)  10/11/23 154 lb 6.4 oz (70 kg)  09/14/23 152 lb (68.9 kg)     Physical Exam Constitutional:      Appearance: Normal appearance.  Cardiovascular:     Rate and Rhythm: Normal rate and regular rhythm.     Heart sounds: Normal heart sounds.  Pulmonary:     Effort: Pulmonary effort is normal.     Breath sounds: Normal breath sounds.  Musculoskeletal:     Right lower leg: No edema.     Left lower leg: No edema.  Neurological:     Mental Status: She is alert.       EKG:   Recent Labs: 05/17/2023: ALT 16 05/19/2023: BUN 59; Creatinine, Ser 8.16; Hemoglobin 11.0; Platelets 128; Potassium 4.7; Sodium 134    Lipid Panel    Component Value Date/Time   CHOL 140 01/18/2019 1358   CHOL 79 06/14/2014 0404   TRIG 148 01/18/2019 1358   TRIG 85 06/14/2014 0404   HDL 43 01/18/2019 1358   HDL 35 (L) 06/14/2014 0404   CHOLHDL 3.3 01/18/2019 1358   VLDL 30 01/18/2019 1358   VLDL 17 06/14/2014 0404   LDLCALC 67 01/18/2019 1358   LDLCALC 27 06/14/2014 0404      Other studies Reviewed: Additional studies/ records that were reviewed today include:  Review of the above records demonstrates:       No data to display            ASSESSMENT AND PLAN:    ICD-10-CM   1. Primary hypertension  I10 enalapril  (VASOTEC ) 2.5  MG tablet  2. SOB (shortness of breath)  R06.02 enalapril  (VASOTEC ) 2.5 MG tablet    3. S/P CABG x 3  Z95.1 enalapril  (VASOTEC ) 2.5 MG tablet    4. Paroxysmal atrial fibrillation (HCC)  I48.0 enalapril  (VASOTEC ) 2.5 MG tablet    5. HFrEF (heart failure with reduced ejection fraction) (HCC)  I50.20 enalapril  (VASOTEC ) 2.5 MG tablet   LVEF 36.3, start enalpril 2.5 as not able to take losartan . Continue metoprolol .    6. Hyperlipidemia, unspecified hyperlipidemia type  E78.5 enalapril  (VASOTEC ) 2.5 MG tablet       Problem List Items Addressed This Visit       Cardiovascular and Mediastinum   HFrEF (heart failure with reduced ejection fraction) (HCC) (Chronic)   Relevant Medications   enalapril  (VASOTEC ) 2.5 MG tablet   Paroxysmal atrial fibrillation (HCC)   Relevant Medications   enalapril  (VASOTEC ) 2.5 MG tablet   Hypertension - Primary   Relevant Medications   enalapril  (VASOTEC ) 2.5 MG tablet     Other   S/P CABG x 3   Relevant Medications   enalapril  (VASOTEC ) 2.5 MG tablet   Hyperlipidemia   Relevant Medications   enalapril  (VASOTEC ) 2.5 MG tablet   Other Visit Diagnoses       SOB (shortness of breath)       Relevant Medications   enalapril  (VASOTEC ) 2.5 MG tablet          Disposition:   Return in about 2 months (around 12/15/2023).    Total time spent: 40 minutes  Signed,  Debborah Fairly, MD  10/15/2023 10:57 AM    [facility]

## 2023-10-18 DIAGNOSIS — Z992 Dependence on renal dialysis: Secondary | ICD-10-CM | POA: Diagnosis not present

## 2023-10-18 DIAGNOSIS — N2581 Secondary hyperparathyroidism of renal origin: Secondary | ICD-10-CM | POA: Diagnosis not present

## 2023-10-18 DIAGNOSIS — N186 End stage renal disease: Secondary | ICD-10-CM | POA: Diagnosis not present

## 2023-10-20 DIAGNOSIS — N186 End stage renal disease: Secondary | ICD-10-CM | POA: Diagnosis not present

## 2023-10-20 DIAGNOSIS — N2581 Secondary hyperparathyroidism of renal origin: Secondary | ICD-10-CM | POA: Diagnosis not present

## 2023-10-20 DIAGNOSIS — Z992 Dependence on renal dialysis: Secondary | ICD-10-CM | POA: Diagnosis not present

## 2023-10-22 DIAGNOSIS — N186 End stage renal disease: Secondary | ICD-10-CM | POA: Diagnosis not present

## 2023-10-22 DIAGNOSIS — N2581 Secondary hyperparathyroidism of renal origin: Secondary | ICD-10-CM | POA: Diagnosis not present

## 2023-10-22 DIAGNOSIS — Z992 Dependence on renal dialysis: Secondary | ICD-10-CM | POA: Diagnosis not present

## 2023-10-24 DIAGNOSIS — I509 Heart failure, unspecified: Secondary | ICD-10-CM | POA: Diagnosis not present

## 2023-10-25 DIAGNOSIS — Z992 Dependence on renal dialysis: Secondary | ICD-10-CM | POA: Diagnosis not present

## 2023-10-25 DIAGNOSIS — N186 End stage renal disease: Secondary | ICD-10-CM | POA: Diagnosis not present

## 2023-10-25 DIAGNOSIS — N2581 Secondary hyperparathyroidism of renal origin: Secondary | ICD-10-CM | POA: Diagnosis not present

## 2023-10-26 ENCOUNTER — Ambulatory Visit
Admission: RE | Admit: 2023-10-26 | Discharge: 2023-10-26 | Disposition: A | Discharge status: Home / Self Care | Source: Ambulatory Visit | Attending: Family | Admitting: Family

## 2023-10-26 DIAGNOSIS — I872 Venous insufficiency (chronic) (peripheral): Secondary | ICD-10-CM

## 2023-10-26 DIAGNOSIS — M79605 Pain in left leg: Secondary | ICD-10-CM | POA: Diagnosis not present

## 2023-10-26 DIAGNOSIS — M7989 Other specified soft tissue disorders: Secondary | ICD-10-CM

## 2023-10-26 DIAGNOSIS — M79672 Pain in left foot: Secondary | ICD-10-CM | POA: Diagnosis not present

## 2023-10-26 DIAGNOSIS — M79671 Pain in right foot: Secondary | ICD-10-CM | POA: Diagnosis not present

## 2023-10-26 DIAGNOSIS — I739 Peripheral vascular disease, unspecified: Secondary | ICD-10-CM

## 2023-10-26 DIAGNOSIS — M79604 Pain in right leg: Secondary | ICD-10-CM | POA: Diagnosis not present

## 2023-10-27 DIAGNOSIS — N2581 Secondary hyperparathyroidism of renal origin: Secondary | ICD-10-CM | POA: Diagnosis not present

## 2023-10-27 DIAGNOSIS — Z992 Dependence on renal dialysis: Secondary | ICD-10-CM | POA: Diagnosis not present

## 2023-10-27 DIAGNOSIS — N186 End stage renal disease: Secondary | ICD-10-CM | POA: Diagnosis not present

## 2023-10-29 DIAGNOSIS — Z992 Dependence on renal dialysis: Secondary | ICD-10-CM | POA: Diagnosis not present

## 2023-10-29 DIAGNOSIS — N2581 Secondary hyperparathyroidism of renal origin: Secondary | ICD-10-CM | POA: Diagnosis not present

## 2023-10-29 DIAGNOSIS — N186 End stage renal disease: Secondary | ICD-10-CM | POA: Diagnosis not present

## 2023-11-01 DIAGNOSIS — Z992 Dependence on renal dialysis: Secondary | ICD-10-CM | POA: Diagnosis not present

## 2023-11-01 DIAGNOSIS — N2581 Secondary hyperparathyroidism of renal origin: Secondary | ICD-10-CM | POA: Diagnosis not present

## 2023-11-01 DIAGNOSIS — Z13220 Encounter for screening for lipoid disorders: Secondary | ICD-10-CM | POA: Diagnosis not present

## 2023-11-01 DIAGNOSIS — N186 End stage renal disease: Secondary | ICD-10-CM | POA: Diagnosis not present

## 2023-11-01 DIAGNOSIS — E1122 Type 2 diabetes mellitus with diabetic chronic kidney disease: Secondary | ICD-10-CM | POA: Diagnosis not present

## 2023-11-01 DIAGNOSIS — R5383 Other fatigue: Secondary | ICD-10-CM | POA: Diagnosis not present

## 2023-11-02 ENCOUNTER — Encounter: Payer: Self-pay | Admitting: Family

## 2023-11-02 ENCOUNTER — Ambulatory Visit (INDEPENDENT_AMBULATORY_CARE_PROVIDER_SITE_OTHER): Admitting: Family

## 2023-11-02 VITALS — BP 112/68 | HR 60 | Ht 66.0 in | Wt 155.0 lb

## 2023-11-02 DIAGNOSIS — K7469 Other cirrhosis of liver: Secondary | ICD-10-CM

## 2023-11-02 DIAGNOSIS — M25532 Pain in left wrist: Secondary | ICD-10-CM

## 2023-11-02 DIAGNOSIS — R930 Abnormal findings on diagnostic imaging of skull and head, not elsewhere classified: Secondary | ICD-10-CM | POA: Diagnosis not present

## 2023-11-02 DIAGNOSIS — Z0130 Encounter for examination of blood pressure without abnormal findings: Secondary | ICD-10-CM

## 2023-11-02 MED ORDER — TRIAMCINOLONE ACETONIDE 0.1 % EX CREA: 1.0000 | TOPICAL_CREAM | Freq: Two times a day (BID) | CUTANEOUS | 2 refills | Status: DC

## 2023-11-02 MED ORDER — OXYCODONE-ACETAMINOPHEN 5-325 MG PO TABS: 1.0000 | ORAL_TABLET | Freq: Four times a day (QID) | ORAL | 0 refills | Status: DC | PRN

## 2023-11-02 MED ORDER — DOXYCYCLINE HYCLATE 100 MG PO CAPS: 100.0000 mg | ORAL_CAPSULE | Freq: Two times a day (BID) | ORAL | 0 refills | Status: DC

## 2023-11-02 NOTE — Assessment & Plan Note (Signed)
 Patient stable.  Well controlled with current therapy.   Continue current meds.

## 2023-11-02 NOTE — Progress Notes (Signed)
 Established Patient Office Visit  Subjective:  Patient ID: [name], female    DOB: Jan 01, 1961  Age: 63 y.o. MRN: [id number]  Chief Complaint  Patient presents with   Follow-up    3 week follow up    Patient is here for her 3 week follow up.   She did have her CTA which shows decreased or minimal flow in the left vertebral artery branches.  Given that she has been having symptoms, I would like to set her up with Neurosurgery.   She also asks if we can send a referral to ortho regarding her left wrist, as she has started to have pain from the arthritis there again, which she had several years ago.    No other concerns at this time.   Past Medical History:  Diagnosis Date   Adult behavior problems    frontal lobe CVA   Anemia    chronic disease   Atrial flutter by electrocardiogram (HCC) 03/22/2014   Cardiomyopathy (HCC)    CHF (congestive heart failure) (HCC)    Chicken pox    Chronic respiratory failure with hypoxia (HCC) 12/19/2021   Coronary artery disease    Delayed surgical wound healing 04/12/2014   Overview:  Left leg   GERD (gastroesophageal reflux disease)    H/O atrial flutter 04/12/2014   H/O stroke without residual deficits 02/13/2014   H/O: substance abuse (HCC) 02/27/2014   Heart failure (HCC)    Hepatitis    history of hep c   History of CVA (cerebrovascular accident) 02/21/2014   History of delirium 04/12/2014   Hyperkalemia 11/13/2014   Hyperlipidemia    Hypertension    Hypotension 02/24/2014   Leukocytosis 02/26/2014   Myocardial infarction [facility])    Obesity    Paroxysmal atrial fibrillation (HCC)    Peripheral vascular disease (HCC)    Postoperative anemia due to acute blood loss 02/27/2014   Renal failure    Renal insufficiency    Stroke [facility]) 2011   Superficial incisional surgical site infection 03/14/2014   Syncope and collapse 07/25/2020    Past Surgical History:  Procedure Laterality Date   A/V FISTULAGRAM Left 03/30/2019    Procedure: A/V FISTULAGRAM;  Surgeon: Celso College, MD;  Location: [facility];  Service: Cardiovascular;  Laterality: Left;   A/V FISTULAGRAM Left 10/06/2019   Procedure: A/V FISTULAGRAM;  Surgeon: Celso College, MD;  Location: [facility];  Service: Cardiovascular;  Laterality: Left;   A/V FISTULAGRAM Left 05/19/2021   Procedure: A/V FISTULAGRAM;  Surgeon: Celso College, MD;  Location: [facility];  Service: Cardiovascular;  Laterality: Left;   A/V FISTULAGRAM Left 01/29/2022   Procedure: A/V Fistulagram;  Surgeon: Celso College, MD;  Location: [facility];  Service: Cardiovascular;  Laterality: Left;   A/V FISTULAGRAM Left 04/30/2022   Procedure: A/V Fistulagram;  Surgeon: Celso College, MD;  Location: [facility];  Service: Cardiovascular;  Laterality: Left;   A/V FISTULAGRAM Left 08/20/2022   Procedure: A/V Fistulagram;  Surgeon: Celso College, MD;  Location: [facility];  Service: Cardiovascular;  Laterality: Left;   A/V FISTULAGRAM N/A 12/03/2022   Procedure: A/V Fistulagram;  Surgeon: Celso College, MD;  Location: [facility];  Service: Cardiovascular;  Laterality: N/A;   A/V FISTULAGRAM Left 02/11/2023   Procedure: A/V Fistulagram;  Surgeon: Celso College, MD;  Location: [facility];  Service: Cardiovascular;  Laterality: Left;  COLONOSCOPY     COLONOSCOPY WITH PROPOFOL  N/A 04/22/2021   Procedure: COLONOSCOPY WITH PROPOFOL ;  Surgeon: Shane Darling, MD;  Location: [facility] ENDOSCOPY;  Service: Endoscopy;  Laterality: N/A;   CORONARY ANGIOPLASTY WITH STENT PLACEMENT  2013   CORONARY ARTERY BYPASS GRAFT     DIALYSIS FISTULA CREATION     DIALYSIS/PERMA CATHETER INSERTION N/A 04/08/2023   Procedure: DIALYSIS/PERMA CATHETER INSERTION;  Surgeon: Celso College, MD;  Location: [facility];  Service: Cardiovascular;  Laterality: N/A;   DIALYSIS/PERMA CATHETER REMOVAL N/A 07/06/2023   Procedure: DIALYSIS/PERMA CATHETER  REMOVAL;  Surgeon: Jackquelyn Mass, MD;  Location: [facility];  Service: Cardiovascular;  Laterality: N/A;   ESOPHAGOGASTRODUODENOSCOPY     ESOPHAGOGASTRODUODENOSCOPY N/A 04/22/2021   Procedure: ESOPHAGOGASTRODUODENOSCOPY (EGD);  Surgeon: Shane Darling, MD;  Location[facility] ENDOSCOPY;  Service: Endoscopy;  Laterality: N/A;   FLEXIBLE SIGMOIDOSCOPY N/A 02/23/2019   Procedure: FLEXIBLE SIGMOIDOSCOPY;  Surgeon: Deveron Fly, MD;  Location: [facility] ENDOSCOPY;  Service: Endoscopy;  Laterality: N/A;   LEFT HEART CATH AND CORS/GRAFTS ANGIOGRAPHY N/A 08/08/2018   Procedure: LEFT HEART CATH AND CORS/GRAFTS ANGIOGRAPHY;  Surgeon: Cherrie Cornwall, MD;  Location: [facility];  Service: Cardiovascular;  Laterality: N/A;   LEFT HEART CATH AND CORS/GRAFTS ANGIOGRAPHY N/A 01/30/2019   Procedure: LEFT HEART CATH AND CORS/GRAFTS ANGIOGRAPHY;  Surgeon: Cherrie Cornwall, MD;  Location: [facility];  Service: Cardiovascular;  Laterality: N/A;   percutaneous insertion intra aortic balloon right cath     ultrasound guided pericardiocentesis      Social History   Socioeconomic History   Marital status: Single    Spouse name: Not on file   Number of children: Not on file   Years of education: Not on file   Highest education level: Not on file  Occupational History   Not on file  Tobacco Use   Smoking status: Some Days    Current packs/day: 0.25    Average packs/day: 0.3 packs/day for 20.0 years (5.0 ttl pk-yrs)    Types: Cigarettes   Smokeless tobacco: Never   Tobacco comments:    smokes two cigarettes a day  Vaping Use   Vaping status: Never Used  Substance and Sexual Activity   Alcohol use: No   Drug use: No   Sexual activity: Not Currently    Birth control/protection: Post-menopausal  Other Topics Concern   Not on file  Social History Narrative   Not on file   Social Drivers of Health   Financial Resource Strain: Not on file  Food Insecurity: No Food  Insecurity (05/18/2023)   Hunger Vital Sign    Worried About Running Out of Food in the Last Year: Never true    Ran Out of Food in the Last Year: Never true  Transportation Needs: No Transportation Needs (05/18/2023)   PRAPARE - Administrator, Civil Service (Medical): No    Lack of Transportation (Non-Medical): No  Physical Activity: Not on file  Stress: Not on file  Social Connections: Not on file  Intimate Partner Violence: Not At Risk (05/18/2023)   Humiliation, Afraid, Rape, and Kick questionnaire    Fear of Current or Ex-Partner: No    Emotionally Abused: No    Physically Abused: No    Sexually Abused: No    Family History  Problem Relation Age of Onset   Hypertension Mother    Ovarian cancer Mother    Diabetes type II Mother  Hypertension Father    Kidney disease Father    Hypertension Sister    Diabetes type II Maternal Grandmother    Breast cancer Maternal Grandmother    Breast cancer Maternal Aunt    Hypertension Other    Cancer Other    Renal Disease Other     Allergies  Allergen Reactions   Morphine  And Codeine Hives   Levaquin [Levofloxacin] Itching    Severe itching; prickly sensation   Enalapril  Other (See Comments)    hallucinations   Latex Rash   Tape Rash and Other (See Comments)    Review of Systems  Cardiovascular:  Positive for leg swelling.  Musculoskeletal:  Positive for joint pain.  Neurological:  Positive for headaches.  All other systems reviewed and are negative.      Objective:   BP 112/68   Pulse 60   Ht 5\' 6"  (1.676 m)   Wt 155 lb (70.3 kg)   SpO2 95%   BMI 25.02 kg/m   Vitals:   11/02/23 0914  BP: 112/68  Pulse: 60  Height: 5\' 6"  (1.676 m)  Weight: 155 lb (70.3 kg)  SpO2: 95%  BMI (Calculated): 25.03    Physical Exam Vitals and nursing note reviewed.  Constitutional:      Appearance: Normal appearance. She is normal weight.  HENT:     Head: Normocephalic.  Eyes:     Extraocular Movements:  Extraocular movements intact.     Conjunctiva/sclera: Conjunctivae normal.     Pupils: Pupils are equal, round, and reactive to light.  Cardiovascular:     Rate and Rhythm: Normal rate.  Pulmonary:     Effort: Pulmonary effort is normal.     Breath sounds: Normal breath sounds.  Musculoskeletal:     Left wrist: Tenderness present. Decreased range of motion.  Neurological:     General: No focal deficit present.     Mental Status: She is alert and oriented to person, place, and time. Mental status is at baseline.  Psychiatric:        Mood and Affect: Mood normal.        Behavior: Behavior normal.        Thought Content: Thought content normal.        Judgment: Judgment normal.      No results found for any visits on 11/02/23.  No results found for this or any previous visit (from the past 2160 hours).     Assessment & Plan:   Problem List Items Addressed This Visit       Digestive   Other cirrhosis of liver (HCC)   Patient stable.  Well controlled with current therapy.   Continue current meds.        Other Visit Diagnoses       Abnormal CT of the head    -  Primary   Sending referral to Neurosurgery.  Will defer to them for further treatment decisions   Relevant Orders   Ambulatory referral to Neurosurgery     Left wrist pain       Sending referral to ortho.  She has seen them previously for this, so we will [facility].   Relevant Orders   Ambulatory referral to Orthopedic Surgery       Return in about 2 months (around 01/02/2024).   Total time spent: 20 minutes  Trenda Frisk, FNP  11/02/2023   This document may have been prepared by [facility] Voice Recognition software and as such  may include unintentional dictation errors.

## 2023-11-03 DIAGNOSIS — Z992 Dependence on renal dialysis: Secondary | ICD-10-CM | POA: Diagnosis not present

## 2023-11-03 DIAGNOSIS — N186 End stage renal disease: Secondary | ICD-10-CM | POA: Diagnosis not present

## 2023-11-03 DIAGNOSIS — N2581 Secondary hyperparathyroidism of renal origin: Secondary | ICD-10-CM | POA: Diagnosis not present

## 2023-11-04 DIAGNOSIS — N186 End stage renal disease: Secondary | ICD-10-CM | POA: Diagnosis not present

## 2023-11-04 DIAGNOSIS — Z992 Dependence on renal dialysis: Secondary | ICD-10-CM | POA: Diagnosis not present

## 2023-11-04 DIAGNOSIS — N2581 Secondary hyperparathyroidism of renal origin: Secondary | ICD-10-CM | POA: Diagnosis not present

## 2023-11-05 DIAGNOSIS — N2581 Secondary hyperparathyroidism of renal origin: Secondary | ICD-10-CM | POA: Diagnosis not present

## 2023-11-05 DIAGNOSIS — Z992 Dependence on renal dialysis: Secondary | ICD-10-CM | POA: Diagnosis not present

## 2023-11-05 DIAGNOSIS — N186 End stage renal disease: Secondary | ICD-10-CM | POA: Diagnosis not present

## 2023-11-08 DIAGNOSIS — N186 End stage renal disease: Secondary | ICD-10-CM | POA: Diagnosis not present

## 2023-11-08 DIAGNOSIS — N2581 Secondary hyperparathyroidism of renal origin: Secondary | ICD-10-CM | POA: Diagnosis not present

## 2023-11-08 DIAGNOSIS — Z992 Dependence on renal dialysis: Secondary | ICD-10-CM | POA: Diagnosis not present

## 2023-11-09 DIAGNOSIS — M1811 Unilateral primary osteoarthritis of first carpometacarpal joint, right hand: Secondary | ICD-10-CM | POA: Diagnosis not present

## 2023-11-10 DIAGNOSIS — N2581 Secondary hyperparathyroidism of renal origin: Secondary | ICD-10-CM | POA: Diagnosis not present

## 2023-11-10 DIAGNOSIS — N186 End stage renal disease: Secondary | ICD-10-CM | POA: Diagnosis not present

## 2023-11-10 DIAGNOSIS — Z992 Dependence on renal dialysis: Secondary | ICD-10-CM | POA: Diagnosis not present

## 2023-11-13 DIAGNOSIS — Z992 Dependence on renal dialysis: Secondary | ICD-10-CM | POA: Diagnosis not present

## 2023-11-13 DIAGNOSIS — N186 End stage renal disease: Secondary | ICD-10-CM | POA: Diagnosis not present

## 2023-11-14 DIAGNOSIS — N186 End stage renal disease: Secondary | ICD-10-CM | POA: Diagnosis not present

## 2023-11-14 DIAGNOSIS — Z992 Dependence on renal dialysis: Secondary | ICD-10-CM | POA: Diagnosis not present

## 2023-11-14 DIAGNOSIS — N2581 Secondary hyperparathyroidism of renal origin: Secondary | ICD-10-CM | POA: Diagnosis not present

## 2023-11-15 DIAGNOSIS — N186 End stage renal disease: Secondary | ICD-10-CM | POA: Diagnosis not present

## 2023-11-15 DIAGNOSIS — N2581 Secondary hyperparathyroidism of renal origin: Secondary | ICD-10-CM | POA: Diagnosis not present

## 2023-11-15 DIAGNOSIS — Z992 Dependence on renal dialysis: Secondary | ICD-10-CM | POA: Diagnosis not present

## 2023-11-17 DIAGNOSIS — N186 End stage renal disease: Secondary | ICD-10-CM | POA: Diagnosis not present

## 2023-11-17 DIAGNOSIS — Z992 Dependence on renal dialysis: Secondary | ICD-10-CM | POA: Diagnosis not present

## 2023-11-17 DIAGNOSIS — N2581 Secondary hyperparathyroidism of renal origin: Secondary | ICD-10-CM | POA: Diagnosis not present

## 2023-11-19 DIAGNOSIS — Z992 Dependence on renal dialysis: Secondary | ICD-10-CM | POA: Diagnosis not present

## 2023-11-19 DIAGNOSIS — N186 End stage renal disease: Secondary | ICD-10-CM | POA: Diagnosis not present

## 2023-11-19 DIAGNOSIS — N2581 Secondary hyperparathyroidism of renal origin: Secondary | ICD-10-CM | POA: Diagnosis not present

## 2023-11-22 DIAGNOSIS — Z992 Dependence on renal dialysis: Secondary | ICD-10-CM | POA: Diagnosis not present

## 2023-11-22 DIAGNOSIS — N186 End stage renal disease: Secondary | ICD-10-CM | POA: Diagnosis not present

## 2023-11-22 DIAGNOSIS — N2581 Secondary hyperparathyroidism of renal origin: Secondary | ICD-10-CM | POA: Diagnosis not present

## 2023-11-24 DIAGNOSIS — N186 End stage renal disease: Secondary | ICD-10-CM | POA: Diagnosis not present

## 2023-11-24 DIAGNOSIS — N2581 Secondary hyperparathyroidism of renal origin: Secondary | ICD-10-CM | POA: Diagnosis not present

## 2023-11-24 DIAGNOSIS — I509 Heart failure, unspecified: Secondary | ICD-10-CM | POA: Diagnosis not present

## 2023-11-24 DIAGNOSIS — Z992 Dependence on renal dialysis: Secondary | ICD-10-CM | POA: Diagnosis not present

## 2023-11-26 DIAGNOSIS — N186 End stage renal disease: Secondary | ICD-10-CM | POA: Diagnosis not present

## 2023-11-26 DIAGNOSIS — Z992 Dependence on renal dialysis: Secondary | ICD-10-CM | POA: Diagnosis not present

## 2023-11-26 DIAGNOSIS — N2581 Secondary hyperparathyroidism of renal origin: Secondary | ICD-10-CM | POA: Diagnosis not present

## 2023-11-29 DIAGNOSIS — N186 End stage renal disease: Secondary | ICD-10-CM | POA: Diagnosis not present

## 2023-11-29 DIAGNOSIS — N2581 Secondary hyperparathyroidism of renal origin: Secondary | ICD-10-CM | POA: Diagnosis not present

## 2023-11-29 DIAGNOSIS — Z992 Dependence on renal dialysis: Secondary | ICD-10-CM | POA: Diagnosis not present

## 2023-12-01 DIAGNOSIS — Z992 Dependence on renal dialysis: Secondary | ICD-10-CM | POA: Diagnosis not present

## 2023-12-01 DIAGNOSIS — N2581 Secondary hyperparathyroidism of renal origin: Secondary | ICD-10-CM | POA: Diagnosis not present

## 2023-12-01 DIAGNOSIS — N186 End stage renal disease: Secondary | ICD-10-CM | POA: Diagnosis not present

## 2023-12-03 DIAGNOSIS — N186 End stage renal disease: Secondary | ICD-10-CM | POA: Diagnosis not present

## 2023-12-03 DIAGNOSIS — N2581 Secondary hyperparathyroidism of renal origin: Secondary | ICD-10-CM | POA: Diagnosis not present

## 2023-12-03 DIAGNOSIS — Z992 Dependence on renal dialysis: Secondary | ICD-10-CM | POA: Diagnosis not present

## 2023-12-06 DIAGNOSIS — Z992 Dependence on renal dialysis: Secondary | ICD-10-CM | POA: Diagnosis not present

## 2023-12-06 DIAGNOSIS — N186 End stage renal disease: Secondary | ICD-10-CM | POA: Diagnosis not present

## 2023-12-06 DIAGNOSIS — N2581 Secondary hyperparathyroidism of renal origin: Secondary | ICD-10-CM | POA: Diagnosis not present

## 2023-12-08 DIAGNOSIS — N186 End stage renal disease: Secondary | ICD-10-CM | POA: Diagnosis not present

## 2023-12-08 DIAGNOSIS — N2581 Secondary hyperparathyroidism of renal origin: Secondary | ICD-10-CM | POA: Diagnosis not present

## 2023-12-08 DIAGNOSIS — Z992 Dependence on renal dialysis: Secondary | ICD-10-CM | POA: Diagnosis not present

## 2023-12-10 DIAGNOSIS — N2581 Secondary hyperparathyroidism of renal origin: Secondary | ICD-10-CM | POA: Diagnosis not present

## 2023-12-10 DIAGNOSIS — N186 End stage renal disease: Secondary | ICD-10-CM | POA: Diagnosis not present

## 2023-12-10 DIAGNOSIS — Z992 Dependence on renal dialysis: Secondary | ICD-10-CM | POA: Diagnosis not present

## 2023-12-13 DIAGNOSIS — N2581 Secondary hyperparathyroidism of renal origin: Secondary | ICD-10-CM | POA: Diagnosis not present

## 2023-12-13 DIAGNOSIS — N186 End stage renal disease: Secondary | ICD-10-CM | POA: Diagnosis not present

## 2023-12-13 DIAGNOSIS — Z992 Dependence on renal dialysis: Secondary | ICD-10-CM | POA: Diagnosis not present

## 2023-12-15 DIAGNOSIS — N2581 Secondary hyperparathyroidism of renal origin: Secondary | ICD-10-CM | POA: Diagnosis not present

## 2023-12-15 DIAGNOSIS — Z992 Dependence on renal dialysis: Secondary | ICD-10-CM | POA: Diagnosis not present

## 2023-12-15 DIAGNOSIS — N186 End stage renal disease: Secondary | ICD-10-CM | POA: Diagnosis not present

## 2023-12-16 ENCOUNTER — Ambulatory Visit: Admitting: Cardiovascular Disease

## 2023-12-16 ENCOUNTER — Encounter: Payer: Self-pay | Admitting: Cardiovascular Disease

## 2023-12-16 VITALS — BP 90/60 | HR 92 | Ht 66.0 in | Wt 154.8 lb

## 2023-12-16 DIAGNOSIS — I10 Essential (primary) hypertension: Secondary | ICD-10-CM | POA: Diagnosis not present

## 2023-12-16 DIAGNOSIS — F17209 Nicotine dependence, unspecified, with unspecified nicotine-induced disorders: Secondary | ICD-10-CM | POA: Diagnosis not present

## 2023-12-16 DIAGNOSIS — I5020 Unspecified systolic (congestive) heart failure: Secondary | ICD-10-CM | POA: Diagnosis not present

## 2023-12-16 DIAGNOSIS — Z951 Presence of aortocoronary bypass graft: Secondary | ICD-10-CM

## 2023-12-16 DIAGNOSIS — I480 Paroxysmal atrial fibrillation: Secondary | ICD-10-CM

## 2023-12-16 DIAGNOSIS — R0602 Shortness of breath: Secondary | ICD-10-CM

## 2023-12-16 NOTE — Progress Notes (Signed)
 Cardiology Office Note   Date:  12/16/2023   ID:  [name], DOB February 06, 1961, MRN [id number]  PCP:  Vanessa Alan HERO, FNP  Cardiologist:  Denyse Bathe, MD      History of Present Illness: [name] is a 63 y.o. female who presents for  Chief Complaint  Patient presents with   Follow-up    2 month follow up    Has no SOB. But gets SOB when not enough fluids taken out o dialysis. No dizziness.      Past Medical History:  Diagnosis Date   Adult behavior problems    frontal lobe CVA   Anemia    chronic disease   Atrial flutter by electrocardiogram (HCC) 03/22/2014   Cardiomyopathy (HCC)    CHF (congestive heart failure) (HCC)    Chicken pox    Chronic respiratory failure with hypoxia (HCC) 12/19/2021   Coronary artery disease    Delayed surgical wound healing 04/12/2014   Overview:  Left leg   GERD (gastroesophageal reflux disease)    H/O atrial flutter 04/12/2014   H/O stroke without residual deficits 02/13/2014   H/O: substance abuse (HCC) 02/27/2014   Heart failure (HCC)    Hepatitis    history of hep c   History of CVA (cerebrovascular accident) 02/21/2014   History of delirium 04/12/2014   Hyperkalemia 11/13/2014   Hyperlipidemia    Hypertension    Hypotension 02/24/2014   Leukocytosis 02/26/2014   Myocardial infarction [facility])    Obesity    Paroxysmal atrial fibrillation (HCC)    Peripheral vascular disease (HCC)    Postoperative anemia due to acute blood loss 02/27/2014   Renal failure    Renal insufficiency    Stroke [facility]) 2011   Superficial incisional surgical site infection 03/14/2014   Syncope and collapse 07/25/2020     Past Surgical History:  Procedure Laterality Date   A/V FISTULAGRAM Left 03/30/2019   Procedure: A/V FISTULAGRAM;  Surgeon: Marea Selinda RAMAN, MD;  Location: [facility];  Service: Cardiovascular;  Laterality: Left;   A/V FISTULAGRAM Left 10/06/2019   Procedure: A/V FISTULAGRAM;  Surgeon: Marea Selinda RAMAN,  MD;  Location: [facility];  Service: Cardiovascular;  Laterality: Left;   A/V FISTULAGRAM Left 05/19/2021   Procedure: A/V FISTULAGRAM;  Surgeon: Marea Selinda RAMAN, MD;  Location: [facility];  Service: Cardiovascular;  Laterality: Left;   A/V FISTULAGRAM Left 01/29/2022   Procedure: A/V Fistulagram;  Surgeon: Marea Selinda RAMAN, MD;  Location: [facility];  Service: Cardiovascular;  Laterality: Left;   A/V FISTULAGRAM Left 04/30/2022   Procedure: A/V Fistulagram;  Surgeon: Marea Selinda RAMAN, MD;  Location: [facility];  Service: Cardiovascular;  Laterality: Left;   A/V FISTULAGRAM Left 08/20/2022   Procedure: A/V Fistulagram;  Surgeon: Marea Selinda RAMAN, MD;  Location: [facility];  Service: Cardiovascular;  Laterality: Left;   A/V FISTULAGRAM N/A 12/03/2022   Procedure: A/V Fistulagram;  Surgeon: Marea Selinda RAMAN, MD;  Location: [facility];  Service: Cardiovascular;  Laterality: N/A;   A/V FISTULAGRAM Left 02/11/2023   Procedure: A/V Fistulagram;  Surgeon: Marea Selinda RAMAN, MD;  Location: [facility];  Service: Cardiovascular;  Laterality: Left;   COLONOSCOPY     COLONOSCOPY WITH PROPOFOL  N/A 04/22/2021   Procedure: COLONOSCOPY WITH PROPOFOL ;  Surgeon: Maryruth Ole DASEN, MD;  Location: [facility] ENDOSCOPY;  Service: Endoscopy;  Laterality: N/A;   CORONARY ANGIOPLASTY WITH STENT PLACEMENT  2013   CORONARY ARTERY BYPASS GRAFT     DIALYSIS FISTULA CREATION     DIALYSIS/PERMA CATHETER INSERTION N/A 04/08/2023   Procedure: DIALYSIS/PERMA CATHETER INSERTION;  Surgeon: Marea Selinda RAMAN, MD;  Location: [facility];  Service: Cardiovascular;  Laterality: N/A;   DIALYSIS/PERMA CATHETER REMOVAL N/A 07/06/2023   Procedure: DIALYSIS/PERMA CATHETER REMOVAL;  Surgeon: [name] Cordella MATSU, MD;  Location: [facility];  Service: Cardiovascular;  Laterality: N/A;   ESOPHAGOGASTRODUODENOSCOPY     ESOPHAGOGASTRODUODENOSCOPY N/A 04/22/2021   Procedure:  ESOPHAGOGASTRODUODENOSCOPY (EGD);  Surgeon: Maryruth Ole DASEN, MD;  Location: [facility] ENDOSCOPY;  Service: Endoscopy;  Laterality: N/A;   FLEXIBLE SIGMOIDOSCOPY N/A 02/23/2019   Procedure: FLEXIBLE SIGMOIDOSCOPY;  Surgeon: Gaylyn Gladis PENNER, MD;  Location: [facility] ENDOSCOPY;  Service: Endoscopy;  Laterality: N/A;   LEFT HEART CATH AND CORS/GRAFTS ANGIOGRAPHY N/A 08/08/2018   Procedure: LEFT HEART CATH AND CORS/GRAFTS ANGIOGRAPHY;  Surgeon: Fernand Denyse LABOR, MD;  Location: [facility];  Service: Cardiovascular;  Laterality: N/A;   LEFT HEART CATH AND CORS/GRAFTS ANGIOGRAPHY N/A 01/30/2019   Procedure: LEFT HEART CATH AND CORS/GRAFTS ANGIOGRAPHY;  Surgeon: Fernand Denyse LABOR, MD;  Location: [facility];  Service: Cardiovascular;  Laterality: N/A;   percutaneous insertion intra aortic balloon right cath     ultrasound guided pericardiocentesis       Current Outpatient Medications  Medication Sig Dispense Refill   apixaban  (ELIQUIS ) 5 MG TABS tablet Take 5 mg by mouth 2 (two) times daily.     enalapril  (VASOTEC ) 2.5 MG tablet Take 1 tablet (2.5 mg total) by mouth daily. 30 tablet 2   hydrOXYzine  (ATARAX /VISTARIL ) 50 MG tablet Take 50 mg by mouth at bedtime.     levothyroxine  (SYNTHROID ) 75 MCG tablet Take 75 mcg by mouth daily before breakfast.      lidocaine -prilocaine  (EMLA ) cream Apply 1 Application topically once.     metoprolol  succinate (TOPROL -XL) 25 MG 24 hr tablet Take 1 tablet by mouth daily.     midodrine  (PROAMATINE ) 10 MG tablet Take 1 tablet (10 mg total) by mouth 3 (three) times daily. Monday, Wednesday, Friday with hemodialysis.   Take 1 tablet once daily on non-dialysis days. 90 tablet 0   omeprazole (PRILOSEC) 20 MG capsule Take 20 mg by mouth daily.     ondansetron  (ZOFRAN -ODT) 4 MG disintegrating tablet Take 1 tablet (4 mg total) by mouth every 8 (eight) hours as needed for nausea or vomiting. 20 tablet 0   oxyCODONE -acetaminophen  (PERCOCET/ROXICET) 5-325 MG tablet  Take 1 tablet by mouth every 6 (six) hours as needed for severe pain (pain score 7-10). 20 tablet 0   ranolazine  (RANEXA ) 1000 MG SR tablet Take 1,000 mg by mouth 2 (two) times daily.     rosuvastatin  (CRESTOR ) 20 MG tablet Take 20 mg by mouth at bedtime.     sevelamer  carbonate (RENVELA ) 800 MG tablet Take 3,200 mg by mouth 3 (three) times daily with meals.     triamcinolone  cream (KENALOG ) 0.1 % Apply 1 Application topically 2 (two) times daily. 453.6 g 2   VELTASSA  8.4 g packet Take 1 packet by mouth daily.     albuterol  (VENTOLIN  HFA) 108 (90 Base) MCG/ACT inhaler Inhale 1-2 puffs into the lungs every 6 (six) hours as needed for wheezing or shortness of breath. (Patient not taking: Reported on 12/16/2023) 20.1 g 3   Bismuth Tribromoph-Petrolatum (XEROFORM PETROLAT PATCH 2X2) PADS Apply 1 each topically every 3 (three) days. (Patient not taking: Reported on 12/16/2023)  25 each 0   doxycycline  (VIBRAMYCIN ) 100 MG capsule Take 1 capsule (100 mg total) by mouth 2 (two) times daily. (Patient not taking: Reported on 12/16/2023) 20 capsule 0   fexofenadine  (ALLEGRA ) 180 MG tablet Take 1 tablet (180 mg total) by mouth daily. (Patient not taking: Reported on 12/16/2023) 90 tablet 1   No current facility-administered medications for this visit.   Facility-Administered Medications Ordered in Other Visits  Medication Dose Route Frequency Provider Last Rate Last Admin   heparin  injection    PRN Dew, Jason S, MD   10,000 Units at 04/08/23 1412    Allergies:   Morphine  and codeine, Levaquin [levofloxacin], Enalapril , Latex, and Tape    Social History:   reports that she has been smoking cigarettes. She has a 5 pack-year smoking history. She has never used smokeless tobacco. She reports that she does not drink alcohol and does not use drugs.   Family History:  family history includes Breast cancer in her maternal aunt and maternal grandmother; Cancer in an other family member; Diabetes type II in her maternal  grandmother and mother; Hypertension in her father, mother, sister, and another family member; Kidney disease in her father; Ovarian cancer in her mother; Renal Disease in an other family member.    ROS:     Review of Systems  Constitutional: Negative.   HENT: Negative.    Eyes: Negative.   Respiratory: Negative.    Gastrointestinal: Negative.   Genitourinary: Negative.   Musculoskeletal: Negative.   Skin: Negative.   Neurological: Negative.   Endo/Heme/Allergies: Negative.   Psychiatric/Behavioral: Negative.    All other systems reviewed and are negative.     All other systems are reviewed and negative.    PHYSICAL EXAM: VS:  BP 90/60   Pulse 92   Ht 5' 6 (1.676 m)   Wt 154 lb 12.8 oz (70.2 kg)   SpO2 98%   BMI 24.99 kg/m  , BMI Body mass index is 24.99 kg/m. Last weight:  Wt Readings from Last 3 Encounters:  12/16/23 154 lb 12.8 oz (70.2 kg)  11/02/23 155 lb (70.3 kg)  10/15/23 150 lb 6.4 oz (68.2 kg)     Physical Exam Constitutional:      Appearance: Normal appearance.  Cardiovascular:     Rate and Rhythm: Normal rate and regular rhythm.     Heart sounds: Normal heart sounds.  Pulmonary:     Effort: Pulmonary effort is normal.     Breath sounds: Normal breath sounds.  Musculoskeletal:     Right lower leg: No edema.     Left lower leg: No edema.  Neurological:     Mental Status: She is alert.       EKG:   Recent Labs: 05/17/2023: ALT 16 05/19/2023: BUN 59; Creatinine, Ser 8.16; Hemoglobin 11.0; Platelets 128; Potassium 4.7; Sodium 134    Lipid Panel    Component Value Date/Time   CHOL 140 01/18/2019 1358   CHOL 79 06/14/2014 0404   TRIG 148 01/18/2019 1358   TRIG 85 06/14/2014 0404   HDL 43 01/18/2019 1358   HDL 35 (L) 06/14/2014 0404   CHOLHDL 3.3 01/18/2019 1358   VLDL 30 01/18/2019 1358   VLDL 17 06/14/2014 0404   LDLCALC 67 01/18/2019 1358   LDLCALC 27 06/14/2014 0404      Other studies Reviewed: Additional studies/ records that  were reviewed today include:  Review of the above records demonstrates:       No data to  display            ASSESSMENT AND PLAN:    ICD-10-CM   1. HFrEF (heart failure with reduced ejection fraction) (HCC)  I50.20    Compensated.    2. Paroxysmal atrial fibrillation (HCC)  I48.0    In sinus rhythm.    3. S/P CABG x 3  Z95.1     4. SOB (shortness of breath)  R06.02     5. Primary hypertension  I10    Hypotensive but denies any dizziness.       Problem List Items Addressed This Visit       Cardiovascular and Mediastinum   HFrEF (heart failure with reduced ejection fraction) (HCC) - Primary (Chronic)   Paroxysmal atrial fibrillation (HCC)   Hypertension     Other   S/P CABG x 3   Other Visit Diagnoses       SOB (shortness of breath)              Disposition:   Return in about 2 months (around 02/16/2024).    Total time spent: 30 minutes  Signed,  Denyse Bathe, MD  12/16/2023 12:02 PM    [facility]

## 2023-12-17 DIAGNOSIS — N186 End stage renal disease: Secondary | ICD-10-CM | POA: Diagnosis not present

## 2023-12-17 DIAGNOSIS — Z992 Dependence on renal dialysis: Secondary | ICD-10-CM | POA: Diagnosis not present

## 2023-12-17 DIAGNOSIS — N2581 Secondary hyperparathyroidism of renal origin: Secondary | ICD-10-CM | POA: Diagnosis not present

## 2023-12-20 ENCOUNTER — Encounter: Payer: Self-pay | Admitting: Family

## 2023-12-20 DIAGNOSIS — Z992 Dependence on renal dialysis: Secondary | ICD-10-CM | POA: Diagnosis not present

## 2023-12-20 DIAGNOSIS — N186 End stage renal disease: Secondary | ICD-10-CM | POA: Diagnosis not present

## 2023-12-20 DIAGNOSIS — N2581 Secondary hyperparathyroidism of renal origin: Secondary | ICD-10-CM | POA: Diagnosis not present

## 2023-12-20 NOTE — Progress Notes (Signed)
 Established Patient Office Visit  Subjective:  Patient ID: [name], female    DOB: April 07, 1961  Age: 63 y.o. MRN: [id number]  Chief Complaint  Patient presents with   Follow-up    1 month follow up    Patient is here today for her 1 month follow up.  She has been feeling fairly well since last appointment.   She does have additional concerns to discuss today. Her MRI of her brain was abnormal, but they suggested a CTA with Contrast to further evaluate.   Labs are not due today. She needs refills.   I have reviewed her active problem list, medication list, allergies, notes from last encounter, lab results for her appointment today.      No other concerns at this time.   Past Medical History:  Diagnosis Date   Adult behavior problems    frontal lobe CVA   Anemia    chronic disease   Atrial flutter by electrocardiogram (HCC) 03/22/2014   Cardiomyopathy (HCC)    CHF (congestive heart failure) (HCC)    Chicken pox    Chronic respiratory failure with hypoxia (HCC) 12/19/2021   Coronary artery disease    Delayed surgical wound healing 04/12/2014   Overview:  Left leg   GERD (gastroesophageal reflux disease)    H/O atrial flutter 04/12/2014   H/O stroke without residual deficits 02/13/2014   H/O: substance abuse (HCC) 02/27/2014   Heart failure (HCC)    Hepatitis    history of hep c   History of CVA (cerebrovascular accident) 02/21/2014   History of delirium 04/12/2014   Hyperkalemia 11/13/2014   Hyperlipidemia    Hypertension    Hypotension 02/24/2014   Leukocytosis 02/26/2014   Myocardial infarction [facility])    Obesity    Paroxysmal atrial fibrillation (HCC)    Peripheral vascular disease (HCC)    Postoperative anemia due to acute blood loss 02/27/2014   Renal failure    Renal insufficiency    Stroke [facility]) 2011   Superficial incisional surgical site infection 03/14/2014   Syncope and collapse 07/25/2020    Past Surgical History:  Procedure  Laterality Date   A/V FISTULAGRAM Left 03/30/2019   Procedure: A/V FISTULAGRAM;  Surgeon: Marea Selinda RAMAN, MD;  Location: [facility];  Service: Cardiovascular;  Laterality: Left;   A/V FISTULAGRAM Left 10/06/2019   Procedure: A/V FISTULAGRAM;  Surgeon: Marea Selinda RAMAN, MD;  Location: [facility];  Service: Cardiovascular;  Laterality: Left;   A/V FISTULAGRAM Left 05/19/2021   Procedure: A/V FISTULAGRAM;  Surgeon: Marea Selinda RAMAN, MD;  Location: [facility];  Service: Cardiovascular;  Laterality: Left;   A/V FISTULAGRAM Left 01/29/2022   Procedure: A/V Fistulagram;  Surgeon: Marea Selinda RAMAN, MD;  Location: [facility];  Service: Cardiovascular;  Laterality: Left;   A/V FISTULAGRAM Left 04/30/2022   Procedure: A/V Fistulagram;  Surgeon: Marea Selinda RAMAN, MD;  Location: [facility];  Service: Cardiovascular;  Laterality: Left;   A/V FISTULAGRAM Left 08/20/2022   Procedure: A/V Fistulagram;  Surgeon: Marea Selinda RAMAN, MD;  Location: [facility];  Service: Cardiovascular;  Laterality: Left;   A/V FISTULAGRAM N/A 12/03/2022   Procedure: A/V Fistulagram;  Surgeon: Marea Selinda RAMAN, MD;  Location: [facility];  Service: Cardiovascular;  Laterality: N/A;   A/V FISTULAGRAM Left 02/11/2023   Procedure: A/V Fistulagram;  Surgeon: Marea Selinda RAMAN, MD;  Location: [facility];  Service: Cardiovascular;  Laterality: Left;  COLONOSCOPY     COLONOSCOPY WITH PROPOFOL  N/A 04/22/2021   Procedure: COLONOSCOPY WITH PROPOFOL ;  Surgeon: Maryruth Ole DASEN, MD;  Location: [facility] ENDOSCOPY;  Service: Endoscopy;  Laterality: N/A;   CORONARY ANGIOPLASTY WITH STENT PLACEMENT  2013   CORONARY ARTERY BYPASS GRAFT     DIALYSIS FISTULA CREATION     DIALYSIS/PERMA CATHETER INSERTION N/A 04/08/2023   Procedure: DIALYSIS/PERMA CATHETER INSERTION;  Surgeon: Marea Selinda RAMAN, MD;  Location: [facility];  Service: Cardiovascular;  Laterality: N/A;   DIALYSIS/PERMA CATHETER REMOVAL N/A  07/06/2023   Procedure: DIALYSIS/PERMA CATHETER REMOVAL;  Surgeon: [name] Cordella MATSU, MD;  Location: [facility];  Service: Cardiovascular;  Laterality: N/A;   ESOPHAGOGASTRODUODENOSCOPY     ESOPHAGOGASTRODUODENOSCOPY N/A 04/22/2021   Procedure: ESOPHAGOGASTRODUODENOSCOPY (EGD);  Surgeon: Maryruth Ole DASEN, MD;  Location[facility] ENDOSCOPY;  Service: Endoscopy;  Laterality: N/A;   FLEXIBLE SIGMOIDOSCOPY N/A 02/23/2019   Procedure: FLEXIBLE SIGMOIDOSCOPY;  Surgeon: Gaylyn Gladis PENNER, MD;  Location: [facility] ENDOSCOPY;  Service: Endoscopy;  Laterality: N/A;   LEFT HEART CATH AND CORS/GRAFTS ANGIOGRAPHY N/A 08/08/2018   Procedure: LEFT HEART CATH AND CORS/GRAFTS ANGIOGRAPHY;  Surgeon: Fernand Denyse LABOR, MD;  Location: [facility];  Service: Cardiovascular;  Laterality: N/A;   LEFT HEART CATH AND CORS/GRAFTS ANGIOGRAPHY N/A 01/30/2019   Procedure: LEFT HEART CATH AND CORS/GRAFTS ANGIOGRAPHY;  Surgeon: Fernand Denyse LABOR, MD;  Location: [facility];  Service: Cardiovascular;  Laterality: N/A;   percutaneous insertion intra aortic balloon right cath     ultrasound guided pericardiocentesis      Social History   Socioeconomic History   Marital status: Single    Spouse name: Not on file   Number of children: Not on file   Years of education: Not on file   Highest education level: Not on file  Occupational History   Not on file  Tobacco Use   Smoking status: Some Days    Current packs/day: 0.25    Average packs/day: 0.3 packs/day for 20.0 years (5.0 ttl pk-yrs)    Types: Cigarettes   Smokeless tobacco: Never   Tobacco comments:    smokes two cigarettes a day  Vaping Use   Vaping status: Never Used  Substance and Sexual Activity   Alcohol use: No   Drug use: No   Sexual activity: Not Currently    Birth control/protection: Post-menopausal  Other Topics Concern   Not on file  Social History Narrative   Not on file   Social Drivers of Health   Financial Resource  Strain: Not on file  Food Insecurity: No Food Insecurity (05/18/2023)   Hunger Vital Sign    Worried About Running Out of Food in the Last Year: Never true    Ran Out of Food in the Last Year: Never true  Transportation Needs: No Transportation Needs (05/18/2023)   PRAPARE - Administrator, Civil Service (Medical): No    Lack of Transportation (Non-Medical): No  Physical Activity: Not on file  Stress: Not on file  Social Connections: Not on file  Intimate Partner Violence: Not At Risk (05/18/2023)   Humiliation, Afraid, Rape, and Kick questionnaire    Fear of Current or Ex-Partner: No    Emotionally Abused: No    Physically Abused: No    Sexually Abused: No    Family History  Problem Relation Age of Onset   Hypertension Mother    Ovarian cancer Mother    Diabetes type II Mother  Hypertension Father    Kidney disease Father    Hypertension Sister    Diabetes type II Maternal Grandmother    Breast cancer Maternal Grandmother    Breast cancer Maternal Aunt    Hypertension Other    Cancer Other    Renal Disease Other     Allergies  Allergen Reactions   Morphine  And Codeine Hives   Levaquin [Levofloxacin] Itching    Severe itching; prickly sensation   Enalapril  Other (See Comments)    hallucinations   Latex Rash   Tape Rash and Other (See Comments)    Review of Systems  Neurological:  Positive for dizziness, weakness and headaches.  All other systems reviewed and are negative.      Objective:   BP 118/70   Pulse (!) 45   Ht 5' 6 (1.676 m)   Wt 156 lb (70.8 kg)   SpO2 94%   BMI 25.18 kg/m   Vitals:   08/24/23 1015  BP: 118/70  Pulse: (!) 45  Height: 5' 6 (1.676 m)  Weight: 156 lb (70.8 kg)  SpO2: 94%  BMI (Calculated): 25.19    Physical Exam Vitals and nursing note reviewed.  Constitutional:      Appearance: Normal appearance. She is normal weight.  HENT:     Head: Normocephalic.  Eyes:     Extraocular Movements: Extraocular  movements intact.     Conjunctiva/sclera: Conjunctivae normal.     Pupils: Pupils are equal, round, and reactive to light.  Cardiovascular:     Rate and Rhythm: Normal rate.  Pulmonary:     Effort: Pulmonary effort is normal.  Neurological:     General: No focal deficit present.     Mental Status: She is alert and oriented to person, place, and time. Mental status is at baseline.  Psychiatric:        Mood and Affect: Mood normal.        Behavior: Behavior normal.        Thought Content: Thought content normal.      No results found for any visits on 08/24/23.  No results found for this or any previous visit (from the past 2160 hours).     Assessment & Plan New daily persistent headache Magnetic resonance imaging of brain abnormal CTA brain ordered.  Will discuss results with patient at follow up.      Return in about 6 months (around 02/24/2024).   Total time spent: 20 minutes  ALAN CHRISTELLA ARRANT, FNP  08/24/2023   This document may have been prepared by [facility] Voice Recognition software and as such may include unintentional dictation errors.

## 2023-12-22 DIAGNOSIS — N186 End stage renal disease: Secondary | ICD-10-CM | POA: Diagnosis not present

## 2023-12-22 DIAGNOSIS — N2581 Secondary hyperparathyroidism of renal origin: Secondary | ICD-10-CM | POA: Diagnosis not present

## 2023-12-22 DIAGNOSIS — Z992 Dependence on renal dialysis: Secondary | ICD-10-CM | POA: Diagnosis not present

## 2023-12-23 ENCOUNTER — Encounter: Payer: Self-pay | Admitting: Neuroradiology

## 2023-12-23 ENCOUNTER — Ambulatory Visit: Admitting: Neuroradiology

## 2023-12-23 VITALS — BP 98/68 | Ht 66.0 in | Wt 157.0 lb

## 2023-12-23 DIAGNOSIS — I4891 Unspecified atrial fibrillation: Secondary | ICD-10-CM

## 2023-12-23 DIAGNOSIS — I6502 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: Secondary | ICD-10-CM | POA: Diagnosis not present

## 2023-12-23 DIAGNOSIS — Z7901 Long term (current) use of anticoagulants: Secondary | ICD-10-CM

## 2023-12-23 NOTE — Progress Notes (Signed)
   Progress Note:   Progress Note: Referring Physician:  Orlean Alan HERO, FNP [address] [place],  [place] [address]  Primary Physician:  Orlean Alan HERO, FNP  Chief Complaint: Headaches, left vertebral artery occlusion  History of Present Illness: [name] is a 63 y.o. female who presents with the chief complaint of headaches.  She was referred for a incidentally detected left vertebral artery occlusion.  She has had multiple strokes, but has not had any new stroke symptoms within the last year.  She denies symptoms of acute dizziness, acute ataxia, numbness or swallowing difficulty.  Significant comorbid conditions of atrial fibrillation, congestive heart failure, end-stage renal disease (on dialysis for 14 years), hypertension I reviewed her Review of Systems:  Negative neurologic review as above  Exam: Today's Vitals   12/23/23 1321  BP: 98/68  Weight: 157 lb (71.2 kg)  Height: 5' 6 (1.676 m)   Body mass index is 25.34 kg/m.    NEUROLOGICAL:   Alert and oriented with normal speech expression, fluency and comprehension. Visual fields are full to confrontation.   Face is symmetric. Strength in the arms and legs is symmetric with no drift.  Sensation is normal and symmetric. Walks with a 4 point cane. No inattention.   Imaging:  I reviewed her brain MRI from August 16, 2023 and her CT arteriogram from September 24, 2023.  The brain MRI shows multiple old infarcts, most likely related to her atrial fibrillation.  These appear to be embolic.  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  No vascular intervention needed.  No routine imaging follow-up needed.  She is on Eliquis  for her atrial fibrillation.  Considering also her end-stage renal disease, I  would not add any antiplatelet therapy.

## 2023-12-24 DIAGNOSIS — Z992 Dependence on renal dialysis: Secondary | ICD-10-CM | POA: Diagnosis not present

## 2023-12-24 DIAGNOSIS — N2581 Secondary hyperparathyroidism of renal origin: Secondary | ICD-10-CM | POA: Diagnosis not present

## 2023-12-24 DIAGNOSIS — N186 End stage renal disease: Secondary | ICD-10-CM | POA: Diagnosis not present

## 2023-12-24 DIAGNOSIS — I509 Heart failure, unspecified: Secondary | ICD-10-CM | POA: Diagnosis not present

## 2023-12-27 DIAGNOSIS — Z992 Dependence on renal dialysis: Secondary | ICD-10-CM | POA: Diagnosis not present

## 2023-12-27 DIAGNOSIS — N186 End stage renal disease: Secondary | ICD-10-CM | POA: Diagnosis not present

## 2023-12-27 DIAGNOSIS — N2581 Secondary hyperparathyroidism of renal origin: Secondary | ICD-10-CM | POA: Diagnosis not present

## 2023-12-29 DIAGNOSIS — Z992 Dependence on renal dialysis: Secondary | ICD-10-CM | POA: Diagnosis not present

## 2023-12-29 DIAGNOSIS — N2581 Secondary hyperparathyroidism of renal origin: Secondary | ICD-10-CM | POA: Diagnosis not present

## 2023-12-29 DIAGNOSIS — N186 End stage renal disease: Secondary | ICD-10-CM | POA: Diagnosis not present

## 2023-12-31 DIAGNOSIS — N186 End stage renal disease: Secondary | ICD-10-CM | POA: Diagnosis not present

## 2023-12-31 DIAGNOSIS — Z992 Dependence on renal dialysis: Secondary | ICD-10-CM | POA: Diagnosis not present

## 2023-12-31 DIAGNOSIS — N2581 Secondary hyperparathyroidism of renal origin: Secondary | ICD-10-CM | POA: Diagnosis not present

## 2024-01-03 DIAGNOSIS — N2581 Secondary hyperparathyroidism of renal origin: Secondary | ICD-10-CM | POA: Diagnosis not present

## 2024-01-03 DIAGNOSIS — N186 End stage renal disease: Secondary | ICD-10-CM | POA: Diagnosis not present

## 2024-01-03 DIAGNOSIS — Z992 Dependence on renal dialysis: Secondary | ICD-10-CM | POA: Diagnosis not present

## 2024-01-04 ENCOUNTER — Ambulatory Visit: Admitting: Family

## 2024-01-04 ENCOUNTER — Encounter: Payer: Self-pay | Admitting: Family

## 2024-01-04 MED ORDER — ALBUTEROL SULFATE HFA 108 (90 BASE) MCG/ACT IN AERS: 1.0000 | INHALATION_SPRAY | Freq: Four times a day (QID) | RESPIRATORY_TRACT | 3 refills | Status: AC | PRN

## 2024-01-04 NOTE — Patient Instructions (Signed)
 3

## 2024-01-05 DIAGNOSIS — N186 End stage renal disease: Secondary | ICD-10-CM | POA: Diagnosis not present

## 2024-01-05 DIAGNOSIS — N2581 Secondary hyperparathyroidism of renal origin: Secondary | ICD-10-CM | POA: Diagnosis not present

## 2024-01-05 DIAGNOSIS — Z992 Dependence on renal dialysis: Secondary | ICD-10-CM | POA: Diagnosis not present

## 2024-01-06 MED ORDER — OXYCODONE-ACETAMINOPHEN 5-325 MG PO TABS: 1.0000 | ORAL_TABLET | Freq: Four times a day (QID) | ORAL | 0 refills | Status: DC | PRN

## 2024-01-07 DIAGNOSIS — N186 End stage renal disease: Secondary | ICD-10-CM | POA: Diagnosis not present

## 2024-01-07 DIAGNOSIS — N2581 Secondary hyperparathyroidism of renal origin: Secondary | ICD-10-CM | POA: Diagnosis not present

## 2024-01-07 DIAGNOSIS — Z992 Dependence on renal dialysis: Secondary | ICD-10-CM | POA: Diagnosis not present

## 2024-01-10 ENCOUNTER — Encounter: Payer: Self-pay | Admitting: Family

## 2024-01-10 DIAGNOSIS — N186 End stage renal disease: Secondary | ICD-10-CM | POA: Diagnosis not present

## 2024-01-10 DIAGNOSIS — Z992 Dependence on renal dialysis: Secondary | ICD-10-CM | POA: Diagnosis not present

## 2024-01-10 DIAGNOSIS — N2581 Secondary hyperparathyroidism of renal origin: Secondary | ICD-10-CM | POA: Diagnosis not present

## 2024-01-10 NOTE — Assessment & Plan Note (Signed)
 Checking labs today.  Continue current therapy for lipid control. Will modify as needed based on labwork results.   -CMP w/eGFR -Lipid Panel

## 2024-01-10 NOTE — Assessment & Plan Note (Signed)
 Patient stable.  Well controlled with current therapy.   Continue current meds.

## 2024-01-10 NOTE — Assessment & Plan Note (Signed)
 Blood pressure well controlled with current medications.  Continue current therapy.  Will reassess at follow up.   - CBC w/Diff - CMP w/eGFR

## 2024-01-10 NOTE — Assessment & Plan Note (Signed)
 Checking labs today.  Will continue supplements as needed.   - Vitamin D  - Vitamin B12 - TSH

## 2024-01-12 DIAGNOSIS — N186 End stage renal disease: Secondary | ICD-10-CM | POA: Diagnosis not present

## 2024-01-12 DIAGNOSIS — Z992 Dependence on renal dialysis: Secondary | ICD-10-CM | POA: Diagnosis not present

## 2024-01-12 DIAGNOSIS — N2581 Secondary hyperparathyroidism of renal origin: Secondary | ICD-10-CM | POA: Diagnosis not present

## 2024-01-13 DIAGNOSIS — N186 End stage renal disease: Secondary | ICD-10-CM | POA: Diagnosis not present

## 2024-01-13 DIAGNOSIS — Z992 Dependence on renal dialysis: Secondary | ICD-10-CM | POA: Diagnosis not present

## 2024-01-14 ENCOUNTER — Telehealth (INDEPENDENT_AMBULATORY_CARE_PROVIDER_SITE_OTHER): Payer: Self-pay

## 2024-01-14 DIAGNOSIS — Z992 Dependence on renal dialysis: Secondary | ICD-10-CM | POA: Diagnosis not present

## 2024-01-14 DIAGNOSIS — N2581 Secondary hyperparathyroidism of renal origin: Secondary | ICD-10-CM | POA: Diagnosis not present

## 2024-01-14 DIAGNOSIS — Z23 Encounter for immunization: Secondary | ICD-10-CM | POA: Diagnosis not present

## 2024-01-14 DIAGNOSIS — N186 End stage renal disease: Secondary | ICD-10-CM | POA: Diagnosis not present

## 2024-01-14 NOTE — Telephone Encounter (Signed)
 Patient left a message stating that she is having sever pain with left arm (dialysis arm). She informed that dialysis did not go well today. I spoke with Dr Marea and recommended that patient come in next week with HDA and see himself or app.

## 2024-01-17 DIAGNOSIS — N2581 Secondary hyperparathyroidism of renal origin: Secondary | ICD-10-CM | POA: Diagnosis not present

## 2024-01-17 DIAGNOSIS — Z992 Dependence on renal dialysis: Secondary | ICD-10-CM | POA: Diagnosis not present

## 2024-01-17 DIAGNOSIS — Z23 Encounter for immunization: Secondary | ICD-10-CM | POA: Diagnosis not present

## 2024-01-17 DIAGNOSIS — N186 End stage renal disease: Secondary | ICD-10-CM | POA: Diagnosis not present

## 2024-01-19 ENCOUNTER — Other Ambulatory Visit (INDEPENDENT_AMBULATORY_CARE_PROVIDER_SITE_OTHER)

## 2024-01-19 DIAGNOSIS — Z992 Dependence on renal dialysis: Secondary | ICD-10-CM | POA: Diagnosis not present

## 2024-01-19 DIAGNOSIS — N186 End stage renal disease: Secondary | ICD-10-CM

## 2024-01-19 DIAGNOSIS — N2581 Secondary hyperparathyroidism of renal origin: Secondary | ICD-10-CM | POA: Diagnosis not present

## 2024-01-19 DIAGNOSIS — Z23 Encounter for immunization: Secondary | ICD-10-CM | POA: Diagnosis not present

## 2024-01-20 ENCOUNTER — Ambulatory Visit (INDEPENDENT_AMBULATORY_CARE_PROVIDER_SITE_OTHER): Admitting: Nurse Practitioner

## 2024-01-20 ENCOUNTER — Encounter (INDEPENDENT_AMBULATORY_CARE_PROVIDER_SITE_OTHER): Payer: Self-pay | Admitting: Nurse Practitioner

## 2024-01-20 VITALS — BP 101/66 | HR 108 | Resp 16 | Ht 66.0 in | Wt 161.0 lb

## 2024-01-20 DIAGNOSIS — I10 Essential (primary) hypertension: Secondary | ICD-10-CM

## 2024-01-20 DIAGNOSIS — N186 End stage renal disease: Secondary | ICD-10-CM

## 2024-01-20 DIAGNOSIS — E785 Hyperlipidemia, unspecified: Secondary | ICD-10-CM

## 2024-01-20 DIAGNOSIS — Z992 Dependence on renal dialysis: Secondary | ICD-10-CM | POA: Diagnosis not present

## 2024-01-21 DIAGNOSIS — N2581 Secondary hyperparathyroidism of renal origin: Secondary | ICD-10-CM | POA: Diagnosis not present

## 2024-01-21 DIAGNOSIS — Z992 Dependence on renal dialysis: Secondary | ICD-10-CM | POA: Diagnosis not present

## 2024-01-21 DIAGNOSIS — N186 End stage renal disease: Secondary | ICD-10-CM | POA: Diagnosis not present

## 2024-01-21 DIAGNOSIS — Z23 Encounter for immunization: Secondary | ICD-10-CM | POA: Diagnosis not present

## 2024-01-23 NOTE — H&P (View-Only) (Signed)
 Subjective:    Patient ID: [name], female    DOB: [birth year], 63 y.o.   MRN: [id number] Chief Complaint  Patient presents with   Follow-up    Follow up HDA     The patient presents today due to having pain and discomfort in her fistula.  She notes that it is very tender and painful with cannulation.  She notes that it is swollen and has some bruising around it.  It appears that it was infiltrated.  She denies any open wounds or significant bleeding.  She has previously had this issue before and it was remedied by allowing the fistula to rest for several weeks with the use of a PermCath.  Unfortunately she developed an abscess after it was removed.  The patient a flow volume of 1492    Review of Systems  All other systems reviewed and are negative.      Objective:   Physical Exam Vitals reviewed.  HENT:     Head: Normocephalic.  Cardiovascular:     Rate and Rhythm: Normal rate.     Pulses: Normal pulses.          Radial pulses are 2+ on the left side.  Pulmonary:     Effort: Pulmonary effort is normal.  Skin:    General: Skin is warm and dry.  Neurological:     Mental Status: She is alert and oriented to person, place, and time.  Psychiatric:        Mood and Affect: Mood normal.        Behavior: Behavior normal.        Thought Content: Thought content normal.        Judgment: Judgment normal.     BP 101/66 (BP Location: Right Arm, Patient Position: Sitting, Cuff Size: Normal)   Pulse (!) 108   Resp 16   Ht 5' 6 (1.676 m)   Wt 161 lb (73 kg)   BMI 25.99 kg/m   Past Medical History:  Diagnosis Date   Adult behavior problems    frontal lobe CVA   Anemia    chronic disease   Atrial flutter by electrocardiogram (HCC) 03/22/2014   Cardiomyopathy (HCC)    CHF (congestive heart failure) (HCC)    Chicken pox    Chronic respiratory failure with hypoxia (HCC) 12/19/2021   Coronary artery disease    Delayed surgical wound healing 04/12/2014   Overview:   Left leg   GERD (gastroesophageal reflux disease)    H/O atrial flutter 04/12/2014   H/O stroke without residual deficits 02/13/2014   H/O: substance abuse (HCC) 02/27/2014   Heart failure (HCC)    Hepatitis    history of hep c   History of CVA (cerebrovascular accident) 02/21/2014   History of delirium 04/12/2014   Hyperkalemia 11/13/2014   Hyperlipidemia    Hypertension    Hypotension 02/24/2014   Leukocytosis 02/26/2014   Myocardial infarction (HCC)    Obesity    Paroxysmal atrial fibrillation (HCC)    Peripheral vascular disease (HCC)    Postoperative anemia due to acute blood loss 02/27/2014   Renal failure    Renal insufficiency    Stroke [facility]) 2011   Superficial incisional surgical site infection 03/14/2014   Syncope and collapse 07/25/2020    Social History   Socioeconomic History   Marital status: Single    Spouse name: Not on file   Number of children: Not on file   Years of education: Not on file  Highest education level: Not on file  Occupational History   Not on file  Tobacco Use   Smoking status: Some Days    Current packs/day: 0.25    Average packs/day: 0.3 packs/day for 20.0 years (5.0 ttl pk-yrs)    Types: Cigarettes   Smokeless tobacco: Never   Tobacco comments:    smokes two cigarettes a day  Vaping Use   Vaping status: Never Used  Substance and Sexual Activity   Alcohol use: No   Drug use: No   Sexual activity: Not Currently    Birth control/protection: Post-menopausal  Other Topics Concern   Not on file  Social History Narrative   Not on file   Social Drivers of Health   Financial Resource Strain: Not on file  Food Insecurity: No Food Insecurity (05/18/2023)   Hunger Vital Sign    Worried About Running Out of Food in the Last Year: Never true    Ran Out of Food in the Last Year: Never true  Transportation Needs: No Transportation Needs (05/18/2023)   PRAPARE - Administrator, Civil Service (Medical): No    Lack of  Transportation (Non-Medical): No  Physical Activity: Not on file  Stress: Not on file  Social Connections: Not on file  Intimate Partner Violence: Not At Risk (05/18/2023)   Humiliation, Afraid, Rape, and Kick questionnaire    Fear of Current or Ex-Partner: No    Emotionally Abused: No    Physically Abused: No    Sexually Abused: No    Past Surgical History:  Procedure Laterality Date   A/V FISTULAGRAM Left 03/30/2019   Procedure: A/V FISTULAGRAM;  Surgeon: Marea Selinda RAMAN, MD;  Location: [facility];  Service: Cardiovascular;  Laterality: Left;   A/V FISTULAGRAM Left 10/06/2019   Procedure: A/V FISTULAGRAM;  Surgeon: Marea Selinda RAMAN, MD;  Location: [facility];  Service: Cardiovascular;  Laterality: Left;   A/V FISTULAGRAM Left 05/19/2021   Procedure: A/V FISTULAGRAM;  Surgeon: Marea Selinda RAMAN, MD;  Location: [facility];  Service: Cardiovascular;  Laterality: Left;   A/V FISTULAGRAM Left 01/29/2022   Procedure: A/V Fistulagram;  Surgeon: Marea Selinda RAMAN, MD;  Location: [facility];  Service: Cardiovascular;  Laterality: Left;   A/V FISTULAGRAM Left 04/30/2022   Procedure: A/V Fistulagram;  Surgeon: Marea Selinda RAMAN, MD;  Location: [facility];  Service: Cardiovascular;  Laterality: Left;   A/V FISTULAGRAM Left 08/20/2022   Procedure: A/V Fistulagram;  Surgeon: Marea Selinda RAMAN, MD;  Location: [facility];  Service: Cardiovascular;  Laterality: Left;   A/V FISTULAGRAM N/A 12/03/2022   Procedure: A/V Fistulagram;  Surgeon: Marea Selinda RAMAN, MD;  Location: [facility];  Service: Cardiovascular;  Laterality: N/A;   A/V FISTULAGRAM Left 02/11/2023   Procedure: A/V Fistulagram;  Surgeon: Marea Selinda RAMAN, MD;  Location: [facility];  Service: Cardiovascular;  Laterality: Left;   COLONOSCOPY     COLONOSCOPY WITH PROPOFOL  N/A 04/22/2021   Procedure: COLONOSCOPY WITH PROPOFOL ;  Surgeon: Maryruth Ole DASEN, MD;  Location: [facility];  Service:  Endoscopy;  Laterality: N/A;   CORONARY ANGIOPLASTY WITH STENT PLACEMENT  2013   CORONARY ARTERY BYPASS GRAFT     DIALYSIS FISTULA CREATION     DIALYSIS/PERMA CATHETER INSERTION N/A 04/08/2023   Procedure: DIALYSIS/PERMA CATHETER INSERTION;  Surgeon: Marea Selinda RAMAN, MD;  Location: [facility];  Service: Cardiovascular;  Laterality: N/A;   DIALYSIS/PERMA CATHETER REMOVAL N/A 07/06/2023  Procedure: DIALYSIS/PERMA CATHETER REMOVAL;  Surgeon: [name] Cordella MATSU, MD;  Location: [facility];  Service: Cardiovascular;  Laterality: N/A;   ESOPHAGOGASTRODUODENOSCOPY     ESOPHAGOGASTRODUODENOSCOPY N/A 04/22/2021   Procedure: ESOPHAGOGASTRODUODENOSCOPY (EGD);  Surgeon: Maryruth Ole DASEN, MD;  Location: [facility];  Service: Endoscopy;  Laterality: N/A;   FLEXIBLE SIGMOIDOSCOPY N/A 02/23/2019   Procedure: FLEXIBLE SIGMOIDOSCOPY;  Surgeon: Gaylyn Gladis PENNER, MD;  Location: [facility];  Service: Endoscopy;  Laterality: N/A;   LEFT HEART CATH AND CORS/GRAFTS ANGIOGRAPHY N/A 08/08/2018   Procedure: LEFT HEART CATH AND CORS/GRAFTS ANGIOGRAPHY;  Surgeon: Fernand Denyse LABOR, MD;  Location: [facility];  Service: Cardiovascular;  Laterality: N/A;   LEFT HEART CATH AND CORS/GRAFTS ANGIOGRAPHY N/A 01/30/2019   Procedure: LEFT HEART CATH AND CORS/GRAFTS ANGIOGRAPHY;  Surgeon: Fernand Denyse LABOR, MD;  Location: [facility];  Service: Cardiovascular;  Laterality: N/A;   percutaneous insertion intra aortic balloon right cath     ultrasound guided pericardiocentesis      Family History  Problem Relation Age of Onset   Hypertension Mother    Ovarian cancer Mother    Diabetes type II Mother    Hypertension Father    Kidney disease Father    Hypertension Sister    Diabetes type II Maternal Grandmother    Breast cancer Maternal Grandmother    Breast cancer Maternal Aunt    Hypertension Other    Cancer Other    Renal Disease Other     Allergies  Allergen Reactions    Morphine  And Codeine Hives   Levaquin [Levofloxacin] Itching    Severe itching; prickly sensation   Enalapril  Other (See Comments)    hallucinations   Latex Rash   Tape Rash and Other (See Comments)       Latest Ref Rng & Units 05/19/2023    9:40 AM 05/19/2023    7:43 AM 05/18/2023    2:24 PM  CBC  WBC 4.0 - 10.5 K/uL 5.3  4.7  4.2   Hemoglobin 12.0 - 15.0 g/dL 88.9  89.3  89.5   Hematocrit 36.0 - 46.0 % 34.6  32.9  32.4   Platelets 150 - 400 K/uL 128  117  117       CMP     Component Value Date/Time   NA 134 (L) 05/19/2023 0940   NA 139 07/09/2014 0503   K 4.7 05/19/2023 0940   K 4.7 07/11/2014 0958   CL 96 (L) 05/19/2023 0940   CL 98 07/09/2014 0503   CO2 25 05/19/2023 0940   CO2 30 07/09/2014 0503   GLUCOSE 111 (H) 05/19/2023 0940   GLUCOSE 107 (H) 07/09/2014 0503   BUN 59 (H) 05/19/2023 0940   BUN 39 (H) 07/09/2014 0503   CREATININE 8.16 (H) 05/19/2023 0940   CREATININE 5.53 (H) 07/09/2014 0503   CALCIUM  9.5 05/19/2023 0940   CALCIUM  8.3 (L) 07/09/2014 0503   PROT 7.7 05/17/2023 0747   PROT 8.4 (H) 07/07/2014 2207   ALBUMIN  3.6 05/19/2023 0940   ALBUMIN  3.1 (L) 07/07/2014 2207   AST 24 05/17/2023 0747   AST 76 (H) 07/07/2014 2207   ALT 16 05/17/2023 0747   ALT 41 07/07/2014 2207   ALKPHOS 105 05/17/2023 0747   ALKPHOS 107 07/07/2014 2207   BILITOT 0.8 05/17/2023 0747   BILITOT 0.7 07/07/2014 2207   GFRNONAA 5 (L) 05/19/2023 0940   GFRNONAA 9 (L) 07/09/2014 0503   GFRNONAA 9 (L) 02/11/2014 0300  No results found.     Assessment & Plan:   1. ESRD on hemodialysis (HCC) (Primary) The patient has significant pain and tenderness in her current fistula.  She has had revisions to this over the years.  This pain may certainly be due to what appears to be a recent infiltration.  I suggested placing a PermCath to allow this area to heal and rest for some time but she had an abscess after her last PermCath removal and so she is not quite willing to do that.   An alternative option is to move more access to her right arm.  Previous studies noted that she would have suitable access for either a right brachial axillary AV graft or a right brachiobasilic.  Following discussion she is more agreeable with a right brachial axillary graft.  We discussed that the infiltration may have been related to possible stenosis.  Based on this the patient will undergo a fistulogram for evaluation to see if this helps to improve her situation.  We have discussed the risk, benefits and alternatives of this and she is agreeable to proceed.  2. Primary hypertension Continue antihypertensive medications as already ordered, these medications have been reviewed and there are no changes at this time.  3. Hyperlipidemia, unspecified hyperlipidemia type Continue statin as ordered and reviewed, no changes at this time   Current Outpatient Medications on File Prior to Visit  Medication Sig Dispense Refill   albuterol  (VENTOLIN  HFA) 108 (90 Base) MCG/ACT inhaler Inhale 1-2 puffs into the lungs every 6 (six) hours as needed for wheezing or shortness of breath. 20.1 g 3   apixaban  (ELIQUIS ) 5 MG TABS tablet Take 5 mg by mouth 2 (two) times daily.     hydrOXYzine  (ATARAX /VISTARIL ) 50 MG tablet Take 50 mg by mouth at bedtime.     levothyroxine  (SYNTHROID ) 75 MCG tablet Take 75 mcg by mouth daily before breakfast.      lidocaine -prilocaine  (EMLA ) cream Apply 1 Application topically once.     metoprolol  succinate (TOPROL -XL) 25 MG 24 hr tablet Take 1 tablet by mouth daily.     midodrine  (PROAMATINE ) 10 MG tablet Take 1 tablet (10 mg total) by mouth 3 (three) times daily. Monday, Wednesday, Friday with hemodialysis.   Take 1 tablet once daily on non-dialysis days. 90 tablet 0   oxyCODONE -acetaminophen  (PERCOCET/ROXICET) 5-325 MG tablet Take 1 tablet by mouth every 6 (six) hours as needed for severe pain (pain score 7-10). 20 tablet 0   pantoprazole  (PROTONIX ) 40 MG tablet Take 40 mg by  mouth daily.     ranolazine  (RANEXA ) 1000 MG SR tablet Take 1,000 mg by mouth 2 (two) times daily.     rosuvastatin  (CRESTOR ) 20 MG tablet Take 20 mg by mouth at bedtime.     sevelamer  carbonate (RENVELA ) 800 MG tablet Take 3,200 mg by mouth 3 (three) times daily with meals.     triamcinolone  cream (KENALOG ) 0.1 % Apply 1 Application topically 2 (two) times daily. 453.6 g 2   Current Facility-Administered Medications on File Prior to Visit  Medication Dose Route Frequency Provider Last Rate Last Admin   heparin  injection    PRN Dew, Jason S, MD   10,000 Units at 04/08/23 1412    There are no Patient Instructions on file for this visit. No follow-ups on file.   Koral Thaden E Linden Ducre, NP

## 2024-01-23 NOTE — Progress Notes (Signed)
 Subjective:    Patient ID: [name], female    DOB: [birth year], 63 y.o.   MRN: [id number] Chief Complaint  Patient presents with   Follow-up    Follow up HDA     The patient presents today due to having pain and discomfort in her fistula.  She notes that it is very tender and painful with cannulation.  She notes that it is swollen and has some bruising around it.  It appears that it was infiltrated.  She denies any open wounds or significant bleeding.  She has previously had this issue before and it was remedied by allowing the fistula to rest for several weeks with the use of a PermCath.  Unfortunately she developed an abscess after it was removed.  The patient a flow volume of 1492    Review of Systems  All other systems reviewed and are negative.      Objective:   Physical Exam Vitals reviewed.  HENT:     Head: Normocephalic.  Cardiovascular:     Rate and Rhythm: Normal rate.     Pulses: Normal pulses.          Radial pulses are 2+ on the left side.  Pulmonary:     Effort: Pulmonary effort is normal.  Skin:    General: Skin is warm and dry.  Neurological:     Mental Status: She is alert and oriented to person, place, and time.  Psychiatric:        Mood and Affect: Mood normal.        Behavior: Behavior normal.        Thought Content: Thought content normal.        Judgment: Judgment normal.     BP 101/66 (BP Location: Right Arm, Patient Position: Sitting, Cuff Size: Normal)   Pulse (!) 108   Resp 16   Ht 5' 6 (1.676 m)   Wt 161 lb (73 kg)   BMI 25.99 kg/m   Past Medical History:  Diagnosis Date   Adult behavior problems    frontal lobe CVA   Anemia    chronic disease   Atrial flutter by electrocardiogram (HCC) 03/22/2014   Cardiomyopathy (HCC)    CHF (congestive heart failure) (HCC)    Chicken pox    Chronic respiratory failure with hypoxia (HCC) 12/19/2021   Coronary artery disease    Delayed surgical wound healing 04/12/2014   Overview:   Left leg   GERD (gastroesophageal reflux disease)    H/O atrial flutter 04/12/2014   H/O stroke without residual deficits 02/13/2014   H/O: substance abuse (HCC) 02/27/2014   Heart failure (HCC)    Hepatitis    history of hep c   History of CVA (cerebrovascular accident) 02/21/2014   History of delirium 04/12/2014   Hyperkalemia 11/13/2014   Hyperlipidemia    Hypertension    Hypotension 02/24/2014   Leukocytosis 02/26/2014   Myocardial infarction (HCC)    Obesity    Paroxysmal atrial fibrillation (HCC)    Peripheral vascular disease (HCC)    Postoperative anemia due to acute blood loss 02/27/2014   Renal failure    Renal insufficiency    Stroke [facility]) 2011   Superficial incisional surgical site infection 03/14/2014   Syncope and collapse 07/25/2020    Social History   Socioeconomic History   Marital status: Single    Spouse name: Not on file   Number of children: Not on file   Years of education: Not on file  Highest education level: Not on file  Occupational History   Not on file  Tobacco Use   Smoking status: Some Days    Current packs/day: 0.25    Average packs/day: 0.3 packs/day for 20.0 years (5.0 ttl pk-yrs)    Types: Cigarettes   Smokeless tobacco: Never   Tobacco comments:    smokes two cigarettes a day  Vaping Use   Vaping status: Never Used  Substance and Sexual Activity   Alcohol use: No   Drug use: No   Sexual activity: Not Currently    Birth control/protection: Post-menopausal  Other Topics Concern   Not on file  Social History Narrative   Not on file   Social Drivers of Health   Financial Resource Strain: Not on file  Food Insecurity: No Food Insecurity (05/18/2023)   Hunger Vital Sign    Worried About Running Out of Food in the Last Year: Never true    Ran Out of Food in the Last Year: Never true  Transportation Needs: No Transportation Needs (05/18/2023)   PRAPARE - Administrator, Civil Service (Medical): No    Lack of  Transportation (Non-Medical): No  Physical Activity: Not on file  Stress: Not on file  Social Connections: Not on file  Intimate Partner Violence: Not At Risk (05/18/2023)   Humiliation, Afraid, Rape, and Kick questionnaire    Fear of Current or Ex-Partner: No    Emotionally Abused: No    Physically Abused: No    Sexually Abused: No    Past Surgical History:  Procedure Laterality Date   A/V FISTULAGRAM Left 03/30/2019   Procedure: A/V FISTULAGRAM;  Surgeon: Marea Selinda RAMAN, MD;  Location: [facility];  Service: Cardiovascular;  Laterality: Left;   A/V FISTULAGRAM Left 10/06/2019   Procedure: A/V FISTULAGRAM;  Surgeon: Marea Selinda RAMAN, MD;  Location: [facility];  Service: Cardiovascular;  Laterality: Left;   A/V FISTULAGRAM Left 05/19/2021   Procedure: A/V FISTULAGRAM;  Surgeon: Marea Selinda RAMAN, MD;  Location: [facility];  Service: Cardiovascular;  Laterality: Left;   A/V FISTULAGRAM Left 01/29/2022   Procedure: A/V Fistulagram;  Surgeon: Marea Selinda RAMAN, MD;  Location: [facility];  Service: Cardiovascular;  Laterality: Left;   A/V FISTULAGRAM Left 04/30/2022   Procedure: A/V Fistulagram;  Surgeon: Marea Selinda RAMAN, MD;  Location: [facility];  Service: Cardiovascular;  Laterality: Left;   A/V FISTULAGRAM Left 08/20/2022   Procedure: A/V Fistulagram;  Surgeon: Marea Selinda RAMAN, MD;  Location: [facility];  Service: Cardiovascular;  Laterality: Left;   A/V FISTULAGRAM N/A 12/03/2022   Procedure: A/V Fistulagram;  Surgeon: Marea Selinda RAMAN, MD;  Location: [facility];  Service: Cardiovascular;  Laterality: N/A;   A/V FISTULAGRAM Left 02/11/2023   Procedure: A/V Fistulagram;  Surgeon: Marea Selinda RAMAN, MD;  Location: [facility];  Service: Cardiovascular;  Laterality: Left;   COLONOSCOPY     COLONOSCOPY WITH PROPOFOL  N/A 04/22/2021   Procedure: COLONOSCOPY WITH PROPOFOL ;  Surgeon: Maryruth Ole DASEN, MD;  Location: [facility];  Service:  Endoscopy;  Laterality: N/A;   CORONARY ANGIOPLASTY WITH STENT PLACEMENT  2013   CORONARY ARTERY BYPASS GRAFT     DIALYSIS FISTULA CREATION     DIALYSIS/PERMA CATHETER INSERTION N/A 04/08/2023   Procedure: DIALYSIS/PERMA CATHETER INSERTION;  Surgeon: Marea Selinda RAMAN, MD;  Location: [facility];  Service: Cardiovascular;  Laterality: N/A;   DIALYSIS/PERMA CATHETER REMOVAL N/A 07/06/2023  Procedure: DIALYSIS/PERMA CATHETER REMOVAL;  Surgeon: [name] Cordella MATSU, MD;  Location: [facility];  Service: Cardiovascular;  Laterality: N/A;   ESOPHAGOGASTRODUODENOSCOPY     ESOPHAGOGASTRODUODENOSCOPY N/A 04/22/2021   Procedure: ESOPHAGOGASTRODUODENOSCOPY (EGD);  Surgeon: Maryruth Ole DASEN, MD;  Location: [facility];  Service: Endoscopy;  Laterality: N/A;   FLEXIBLE SIGMOIDOSCOPY N/A 02/23/2019   Procedure: FLEXIBLE SIGMOIDOSCOPY;  Surgeon: Gaylyn Gladis PENNER, MD;  Location: [facility];  Service: Endoscopy;  Laterality: N/A;   LEFT HEART CATH AND CORS/GRAFTS ANGIOGRAPHY N/A 08/08/2018   Procedure: LEFT HEART CATH AND CORS/GRAFTS ANGIOGRAPHY;  Surgeon: Fernand Denyse LABOR, MD;  Location: [facility];  Service: Cardiovascular;  Laterality: N/A;   LEFT HEART CATH AND CORS/GRAFTS ANGIOGRAPHY N/A 01/30/2019   Procedure: LEFT HEART CATH AND CORS/GRAFTS ANGIOGRAPHY;  Surgeon: Fernand Denyse LABOR, MD;  Location: [facility];  Service: Cardiovascular;  Laterality: N/A;   percutaneous insertion intra aortic balloon right cath     ultrasound guided pericardiocentesis      Family History  Problem Relation Age of Onset   Hypertension Mother    Ovarian cancer Mother    Diabetes type II Mother    Hypertension Father    Kidney disease Father    Hypertension Sister    Diabetes type II Maternal Grandmother    Breast cancer Maternal Grandmother    Breast cancer Maternal Aunt    Hypertension Other    Cancer Other    Renal Disease Other     Allergies  Allergen Reactions    Morphine  And Codeine Hives   Levaquin [Levofloxacin] Itching    Severe itching; prickly sensation   Enalapril  Other (See Comments)    hallucinations   Latex Rash   Tape Rash and Other (See Comments)       Latest Ref Rng & Units 05/19/2023    9:40 AM 05/19/2023    7:43 AM 05/18/2023    2:24 PM  CBC  WBC 4.0 - 10.5 K/uL 5.3  4.7  4.2   Hemoglobin 12.0 - 15.0 g/dL 88.9  89.3  89.5   Hematocrit 36.0 - 46.0 % 34.6  32.9  32.4   Platelets 150 - 400 K/uL 128  117  117       CMP     Component Value Date/Time   NA 134 (L) 05/19/2023 0940   NA 139 07/09/2014 0503   K 4.7 05/19/2023 0940   K 4.7 07/11/2014 0958   CL 96 (L) 05/19/2023 0940   CL 98 07/09/2014 0503   CO2 25 05/19/2023 0940   CO2 30 07/09/2014 0503   GLUCOSE 111 (H) 05/19/2023 0940   GLUCOSE 107 (H) 07/09/2014 0503   BUN 59 (H) 05/19/2023 0940   BUN 39 (H) 07/09/2014 0503   CREATININE 8.16 (H) 05/19/2023 0940   CREATININE 5.53 (H) 07/09/2014 0503   CALCIUM  9.5 05/19/2023 0940   CALCIUM  8.3 (L) 07/09/2014 0503   PROT 7.7 05/17/2023 0747   PROT 8.4 (H) 07/07/2014 2207   ALBUMIN  3.6 05/19/2023 0940   ALBUMIN  3.1 (L) 07/07/2014 2207   AST 24 05/17/2023 0747   AST 76 (H) 07/07/2014 2207   ALT 16 05/17/2023 0747   ALT 41 07/07/2014 2207   ALKPHOS 105 05/17/2023 0747   ALKPHOS 107 07/07/2014 2207   BILITOT 0.8 05/17/2023 0747   BILITOT 0.7 07/07/2014 2207   GFRNONAA 5 (L) 05/19/2023 0940   GFRNONAA 9 (L) 07/09/2014 0503   GFRNONAA 9 (L) 02/11/2014 0300  No results found.     Assessment & Plan:   1. ESRD on hemodialysis (HCC) (Primary) The patient has significant pain and tenderness in her current fistula.  She has had revisions to this over the years.  This pain may certainly be due to what appears to be a recent infiltration.  I suggested placing a PermCath to allow this area to heal and rest for some time but she had an abscess after her last PermCath removal and so she is not quite willing to do that.   An alternative option is to move more access to her right arm.  Previous studies noted that she would have suitable access for either a right brachial axillary AV graft or a right brachiobasilic.  Following discussion she is more agreeable with a right brachial axillary graft.  We discussed that the infiltration may have been related to possible stenosis.  Based on this the patient will undergo a fistulogram for evaluation to see if this helps to improve her situation.  We have discussed the risk, benefits and alternatives of this and she is agreeable to proceed.  2. Primary hypertension Continue antihypertensive medications as already ordered, these medications have been reviewed and there are no changes at this time.  3. Hyperlipidemia, unspecified hyperlipidemia type Continue statin as ordered and reviewed, no changes at this time   Current Outpatient Medications on File Prior to Visit  Medication Sig Dispense Refill   albuterol  (VENTOLIN  HFA) 108 (90 Base) MCG/ACT inhaler Inhale 1-2 puffs into the lungs every 6 (six) hours as needed for wheezing or shortness of breath. 20.1 g 3   apixaban  (ELIQUIS ) 5 MG TABS tablet Take 5 mg by mouth 2 (two) times daily.     hydrOXYzine  (ATARAX /VISTARIL ) 50 MG tablet Take 50 mg by mouth at bedtime.     levothyroxine  (SYNTHROID ) 75 MCG tablet Take 75 mcg by mouth daily before breakfast.      lidocaine -prilocaine  (EMLA ) cream Apply 1 Application topically once.     metoprolol  succinate (TOPROL -XL) 25 MG 24 hr tablet Take 1 tablet by mouth daily.     midodrine  (PROAMATINE ) 10 MG tablet Take 1 tablet (10 mg total) by mouth 3 (three) times daily. Monday, Wednesday, Friday with hemodialysis.   Take 1 tablet once daily on non-dialysis days. 90 tablet 0   oxyCODONE -acetaminophen  (PERCOCET/ROXICET) 5-325 MG tablet Take 1 tablet by mouth every 6 (six) hours as needed for severe pain (pain score 7-10). 20 tablet 0   pantoprazole  (PROTONIX ) 40 MG tablet Take 40 mg by  mouth daily.     ranolazine  (RANEXA ) 1000 MG SR tablet Take 1,000 mg by mouth 2 (two) times daily.     rosuvastatin  (CRESTOR ) 20 MG tablet Take 20 mg by mouth at bedtime.     sevelamer  carbonate (RENVELA ) 800 MG tablet Take 3,200 mg by mouth 3 (three) times daily with meals.     triamcinolone  cream (KENALOG ) 0.1 % Apply 1 Application topically 2 (two) times daily. 453.6 g 2   Current Facility-Administered Medications on File Prior to Visit  Medication Dose Route Frequency Provider Last Rate Last Admin   heparin  injection    PRN Dew, Jason S, MD   10,000 Units at 04/08/23 1412    There are no Patient Instructions on file for this visit. No follow-ups on file.   Koral Thaden E Linden Ducre, NP

## 2024-01-24 ENCOUNTER — Telehealth: Payer: Self-pay | Admitting: Family

## 2024-01-24 DIAGNOSIS — I509 Heart failure, unspecified: Secondary | ICD-10-CM | POA: Diagnosis not present

## 2024-01-24 DIAGNOSIS — N186 End stage renal disease: Secondary | ICD-10-CM | POA: Diagnosis not present

## 2024-01-24 DIAGNOSIS — Z992 Dependence on renal dialysis: Secondary | ICD-10-CM | POA: Diagnosis not present

## 2024-01-24 DIAGNOSIS — N2581 Secondary hyperparathyroidism of renal origin: Secondary | ICD-10-CM | POA: Diagnosis not present

## 2024-01-24 DIAGNOSIS — Z23 Encounter for immunization: Secondary | ICD-10-CM | POA: Diagnosis not present

## 2024-01-24 NOTE — Telephone Encounter (Signed)
 Patient left VM about a referral to pain management that she is waiting on. Please advise.

## 2024-01-25 ENCOUNTER — Telehealth (INDEPENDENT_AMBULATORY_CARE_PROVIDER_SITE_OTHER): Payer: Self-pay

## 2024-01-25 ENCOUNTER — Other Ambulatory Visit: Payer: Self-pay

## 2024-01-25 NOTE — Telephone Encounter (Signed)
 Spoke with the patient and she is scheduled for a left arm fistulagram on 02/07/24 with a 8:30 am arrival time to the [facility]. Pre-procedure instructions were discussed and will be mailed. Anesthesia was ordered as well with Megan.

## 2024-01-26 DIAGNOSIS — N2581 Secondary hyperparathyroidism of renal origin: Secondary | ICD-10-CM | POA: Diagnosis not present

## 2024-01-26 DIAGNOSIS — N186 End stage renal disease: Secondary | ICD-10-CM | POA: Diagnosis not present

## 2024-01-26 DIAGNOSIS — Z992 Dependence on renal dialysis: Secondary | ICD-10-CM | POA: Diagnosis not present

## 2024-01-26 DIAGNOSIS — Z23 Encounter for immunization: Secondary | ICD-10-CM | POA: Diagnosis not present

## 2024-01-28 DIAGNOSIS — Z23 Encounter for immunization: Secondary | ICD-10-CM | POA: Diagnosis not present

## 2024-01-28 DIAGNOSIS — Z992 Dependence on renal dialysis: Secondary | ICD-10-CM | POA: Diagnosis not present

## 2024-01-28 DIAGNOSIS — N186 End stage renal disease: Secondary | ICD-10-CM | POA: Diagnosis not present

## 2024-01-28 DIAGNOSIS — N2581 Secondary hyperparathyroidism of renal origin: Secondary | ICD-10-CM | POA: Diagnosis not present

## 2024-01-31 ENCOUNTER — Other Ambulatory Visit (INDEPENDENT_AMBULATORY_CARE_PROVIDER_SITE_OTHER): Payer: Self-pay | Admitting: Nurse Practitioner

## 2024-01-31 DIAGNOSIS — Z992 Dependence on renal dialysis: Secondary | ICD-10-CM | POA: Diagnosis not present

## 2024-01-31 DIAGNOSIS — Z23 Encounter for immunization: Secondary | ICD-10-CM | POA: Diagnosis not present

## 2024-01-31 DIAGNOSIS — N186 End stage renal disease: Secondary | ICD-10-CM | POA: Diagnosis not present

## 2024-01-31 DIAGNOSIS — N2581 Secondary hyperparathyroidism of renal origin: Secondary | ICD-10-CM | POA: Diagnosis not present

## 2024-02-01 ENCOUNTER — Telehealth: Payer: Self-pay | Admitting: Urgent Care

## 2024-02-01 ENCOUNTER — Encounter: Payer: Self-pay | Admitting: Urgent Care

## 2024-02-01 ENCOUNTER — Encounter
Admission: RE | Admit: 2024-02-01 | Discharge: 2024-02-01 | Disposition: A | Discharge status: Home / Self Care | Source: Ambulatory Visit | Attending: Vascular Surgery | Admitting: Vascular Surgery

## 2024-02-01 ENCOUNTER — Other Ambulatory Visit: Payer: Self-pay

## 2024-02-01 VITALS — BP 83/65 | HR 97 | Temp 98.1°F | Resp 17 | Ht 66.0 in | Wt 160.0 lb

## 2024-02-01 DIAGNOSIS — I4892 Unspecified atrial flutter: Secondary | ICD-10-CM

## 2024-02-01 DIAGNOSIS — I129 Hypertensive chronic kidney disease with stage 1 through stage 4 chronic kidney disease, or unspecified chronic kidney disease: Secondary | ICD-10-CM | POA: Insufficient documentation

## 2024-02-01 DIAGNOSIS — Z22322 Carrier or suspected carrier of Methicillin resistant Staphylococcus aureus: Secondary | ICD-10-CM

## 2024-02-01 DIAGNOSIS — Z992 Dependence on renal dialysis: Secondary | ICD-10-CM | POA: Diagnosis not present

## 2024-02-01 DIAGNOSIS — Z01812 Encounter for preprocedural laboratory examination: Secondary | ICD-10-CM | POA: Diagnosis not present

## 2024-02-01 DIAGNOSIS — I10 Essential (primary) hypertension: Secondary | ICD-10-CM

## 2024-02-01 DIAGNOSIS — N186 End stage renal disease: Secondary | ICD-10-CM | POA: Insufficient documentation

## 2024-02-01 HISTORY — DX: 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: I65.02

## 2024-02-01 HISTORY — DX: Anemia in chronic kidney disease: D63.1

## 2024-02-01 HISTORY — DX: Carrier or suspected carrier of methicillin resistant Staphylococcus aureus: Z22.322

## 2024-02-01 LAB — BASIC METABOLIC PANEL WITH GFR
Anion gap: 17 — ABNORMAL HIGH (ref 5–15)
BUN: 46 mg/dL — ABNORMAL HIGH (ref 8–23)
CO2: 24 mmol/L (ref 22–32)
Calcium: 8.5 mg/dL — ABNORMAL LOW (ref 8.9–10.3)
Chloride: 97 mmol/L — ABNORMAL LOW (ref 98–111)
Creatinine, Ser: 7.02 mg/dL — ABNORMAL HIGH (ref 0.44–1.00)
GFR, Estimated: 6 mL/min — ABNORMAL LOW (ref >60)
Glucose, Bld: 72 mg/dL (ref 70–99)
Potassium: 4.3 mmol/L (ref 3.5–5.1)
Sodium: 138 mmol/L (ref 135–145)

## 2024-02-01 LAB — CBC
HCT: 41.2 % (ref 36.0–46.0)
Hemoglobin: 13.5 g/dL (ref 12.0–15.0)
MCH: 32.2 pg (ref 26.0–34.0)
MCHC: 32.8 g/dL (ref 30.0–36.0)
MCV: 98.3 fL (ref 80.0–100.0)
Platelets: 116 K/uL — ABNORMAL LOW (ref 150–400)
RBC: 4.19 MIL/uL (ref 3.87–5.11)
RDW: 15.6 % — ABNORMAL HIGH (ref 11.5–15.5)
WBC: 4.9 K/uL (ref 4.0–10.5)
nRBC: 0 % (ref 0.0–0.2)

## 2024-02-01 LAB — SURGICAL PCR SCREEN
MRSA, PCR: POSITIVE — AB
Staphylococcus aureus: POSITIVE — AB

## 2024-02-01 MED ORDER — MUPIROCIN 2 % EX OINT: TOPICAL_OINTMENT | CUTANEOUS | 0 refills | Status: DC

## 2024-02-01 NOTE — Patient Instructions (Addendum)
 Your procedure is scheduled on: Monday 8/25 Report to the Registration Desk on the 1st floor of the [facility]. To find out your arrival time, please call [phone] between 1PM - 3PM on: Friday 8/22 If your arrival time is 6:00 am, do not arrive before that time as the [facility] entrance doors do not open until 6:00 am.  REMEMBER: Instructions that are not followed completely may result in serious medical risk, up to and including death; or upon the discretion of your surgeon and anesthesiologist your surgery may need to be rescheduled.  Do not eat food or drink fluids after midnight the night before surgery.  No gum chewing or hard candies.  One week prior to surgery: Stop Anti-inflammatories (NSAIDS) such as Advil , Aleve, Ibuprofen , Motrin , Naproxen, Naprosyn and Aspirin  based products such as Excedrin, Goody's Powder, BC Powder. Stop ANY OVER THE COUNTER supplements until after surgery.  You may however, continue to take Tylenol  if needed for pain up until the day of surgery.   Stop apixaban  (ELIQUIS ) 5 MG TABS tablet 2 days prior to surgery (Take last dose Friday 02/04/24)  Continue taking all of your other prescription medications up until the day of surgery.  ON THE DAY OF SURGERY ONLY TAKE THESE MEDICATIONS WITH SIPS OF WATER:  levothyroxine  (SYNTHROID ) 75 MCG tablet  metoprolol  succinate (TOPROL -XL) 25 MG 24 hr tablet  pantoprazole  (PROTONIX ) 40 MG tablet   Use inhalers on the day of surgery and bring to the hospital.  1 albuterol  (VENTOLIN  HFA) 108 (90 Base) MCG/ACT inhaler   No Alcohol for 24 hours before or after surgery.  No Smoking including e-cigarettes for 24 hours before surgery.  No chewable tobacco products for at least 6 hours before surgery.  No nicotine patches on the day of surgery.  Do not use any recreational drugs for at least a week (preferably 2 weeks) before your surgery.  Please be advised that the combination of cocaine and anesthesia may  have negative outcomes, up to and including death. If you test positive for cocaine, your surgery will be cancelled.  On the morning of surgery brush your teeth with toothpaste and water, you may rinse your mouth with mouthwash if you wish. Do not swallow any toothpaste or mouthwash.  Use CHG Soap or wipes as directed on instruction sheet.  Do not wear jewelry, make-up, hairpins, clips or nail polish.  For welded (permanent) jewelry: bracelets, anklets, waist bands, etc.  Please have this removed prior to surgery.  If it is not removed, there is a chance that hospital personnel will need to cut it off on the day of surgery.  Do not wear lotions, powders, or perfumes.   Do not shave body hair from the neck down 48 hours before surgery.  Contact lenses, hearing aids and dentures may not be worn into surgery.  Do not bring valuables to the hospital. [facility] is not responsible for any missing/lost belongings or valuables.   Notify your doctor if there is any change in your medical condition (cold, fever, infection).  Wear comfortable clothing (specific to your surgery type) to the hospital.  If you are being admitted to the hospital overnight, leave your suitcase in the car. After surgery it may be brought to your room.  In case of increased patient census, it may be necessary for you, the patient, to continue your postoperative care in the Same Day Surgery department.  If you are being discharged the day of surgery, you will not  be allowed to drive home. You will need a responsible individual to drive you home and stay with you for 24 hours after surgery.   If you are taking public transportation, you will need to have a responsible individual with you.  Please call the Pre-admissions Testing Dept. at [phone] if you have any questions about these instructions.   Merchandiser, retail to address health-related social needs:   [email or web address]     Preparing for Surgery with CHLORHEXIDINE  GLUCONATE (CHG) Soap  Chlorhexidine  Gluconate (CHG) Soap  o An antiseptic cleaner that kills germs and bonds with the skin to continue killing germs even after washing  o Used for showering the night before surgery and morning of surgery  Before surgery, you can play an important role by reducing the number of germs on your skin.  CHG (Chlorhexidine  gluconate) soap is an antiseptic cleanser which kills germs and bonds with the skin to continue killing germs even after washing.  Please do not use if you have an allergy to CHG or antibacterial soaps. If your skin becomes reddened/irritated stop using the CHG.  1. Shower the NIGHT BEFORE SURGERY and the MORNING OF SURGERY with CHG soap.  2. If you choose to wash your hair, wash your hair first as usual with your normal shampoo.  3. After shampooing, rinse your hair and body thoroughly to remove the shampoo.  4. Use CHG as you would any other liquid soap. You can apply CHG directly to the skin and wash gently with a scrungie or a clean washcloth.  5. Apply the CHG soap to your body only from the neck down. Do not use on open wounds or open sores. Avoid contact with your eyes, ears, mouth, and genitals (private parts). Wash face and genitals (private parts) with your normal soap.  6. Wash thoroughly, paying special attention to the area where your surgery will be performed.  7. Thoroughly rinse your body with warm water.  8. Do not shower/wash with your normal soap after using and rinsing off the CHG soap.  9. Pat yourself dry with a clean towel.  10. Wear clean pajamas to bed the night before surgery.  12. Place clean sheets on your bed the night of your first shower and do not sleep with pets.  13. Shower again with the CHG soap on the day of surgery prior to arriving at the hospital.  14. Do not apply any deodorants/lotions/powders.  15. Please  wear clean clothes to the hospital.

## 2024-02-01 NOTE — Progress Notes (Signed)
  Perioperative Services  Abnormal Lab Notification and Treatment Plan of Care  Date: 02/01/24  Name: [name] DOB: 1961/06/10 MRN:   [id number]  Re: Abnormal labs noted during PAT appointment  Provider(s) Notified: Marea Mayo, MD          Delores Pickles, FNP-C  Notification mode: Routed and/or faxed via [facility]  Labs of concern: Lab Results  Component Value Date   STAPHAUREUS POSITIVE (A) 02/01/2024   MRSAPCR POSITIVE (A) 02/01/2024    Notes: Patient is scheduled for A/V FISTULAGRAM on 02/07/2024. She is scheduled to receive CEFAZOLIN  pre-operatively. Pre-surgical PCR (+) for MRSA; see above.  PLANS:  Review renal function. Estimated Creatinine Clearance: 8.6 mL/min (A) (by C-G formula based on SCr of 7.02 mg/dL (H)).  Review allergies. No documented allergy to vancomycin.  Order for VANCOMYCIN to be added after pharmacy consult. Pharmacy involvement requested as patient has severely reduced renal function and is on hemodialysis on M,W,F.   Patient with orders for both CEFAZOLIN  + VANCOMYCIN to be given in the setting of documented MRSA (+) surgical PCR.   Guidelines suggest that a beta-lactam antibiotic (first or second generation cephalosporin) should be added for activity against gram-negative organisms.  Vancomycin appears to be less effective than cefazolin  for preventing SSIs caused by MSSA. For this reason, the use of vancomycin in combination with cefazolin  is favored for prevention of SSI due to MRSA and coagulase-negative staphylococci.  Medical history in [facility] updated to reflect (+) PCR result indicating nasal MRSA colonization   Rx for additional preoperative nasal decolonization sent to patient's pharmacy of record.   Meds ordered this encounter  Medications   mupirocin  ointment (BACTROBAN ) 2 %    Sig: Apply small amount to the inside of both nostrils TWICE a day for the next 5 days.    Dispense:  15 g    Refill:  0   Encounter Diagnoses  Name Primary?    Pre-operative laboratory examination Yes   Nose colonized with MRSA    Dorise Pereyra, MSN, APRN, FNP-C, CEN [facility]  Perioperative Services Nurse Practitioner Phone: [phone] 02/01/24 2:35 PM

## 2024-02-01 NOTE — Pre-Procedure Instructions (Signed)
 Attempted to call patient [name] to inform her to continue with her Eliquis  5 mg until the day before surgery, no answer so left voice mail. Called and spoke with her brother Kiondra Caicedo who came with her to the PAT appointment and was informed to have patient continue with Eliquis  5mg  until the surgery to please inform patient since she is not answering phone call. Brother verbalized that he will inform patient.

## 2024-02-02 DIAGNOSIS — Z23 Encounter for immunization: Secondary | ICD-10-CM | POA: Diagnosis not present

## 2024-02-02 DIAGNOSIS — N2581 Secondary hyperparathyroidism of renal origin: Secondary | ICD-10-CM | POA: Diagnosis not present

## 2024-02-02 DIAGNOSIS — Z992 Dependence on renal dialysis: Secondary | ICD-10-CM | POA: Diagnosis not present

## 2024-02-02 DIAGNOSIS — N186 End stage renal disease: Secondary | ICD-10-CM | POA: Diagnosis not present

## 2024-02-02 LAB — TYPE AND SCREEN
ABO/RH(D): O POS
Antibody Screen: POSITIVE
DAT, IgG: NEGATIVE
DAT, complement: NEGATIVE

## 2024-02-03 ENCOUNTER — Encounter: Payer: Self-pay | Admitting: Vascular Surgery

## 2024-02-04 ENCOUNTER — Encounter: Payer: Self-pay | Admitting: Vascular Surgery

## 2024-02-04 DIAGNOSIS — N2581 Secondary hyperparathyroidism of renal origin: Secondary | ICD-10-CM | POA: Diagnosis not present

## 2024-02-04 DIAGNOSIS — N186 End stage renal disease: Secondary | ICD-10-CM | POA: Diagnosis not present

## 2024-02-04 DIAGNOSIS — Z23 Encounter for immunization: Secondary | ICD-10-CM | POA: Diagnosis not present

## 2024-02-04 DIAGNOSIS — Z992 Dependence on renal dialysis: Secondary | ICD-10-CM | POA: Diagnosis not present

## 2024-02-04 NOTE — Progress Notes (Signed)
 Perioperative / Anesthesia Services  Pre-Admission Testing Clinical Review / Pre-Operative Anesthesia Consult  Date: 02/04/24  PATIENT DEMOGRAPHICS: Name: [name] DOB: 1961-02-10 MRN:   [id number]  Note: Available PAT nursing documentation and vital signs have been reviewed. Clinical nursing staff has updated patient's PMH/PSHx, current medication list, and drug allergies/intolerances to ensure complete and comprehensive history available to assist care teams in MDM as it pertains to the aforementioned surgical procedure and anticipated anesthetic course. Extensive review of available clinical information personally performed. [place] PMH and PSHx updated with any diagnoses/procedures that  may have been inadvertently omitted during her intake with the pre-admission testing department's nursing staff.  PLANNED SURGICAL PROCEDURE(S):   Case: [id number] Date/Time: 02/07/24 0930   Procedure: A/V Fistulagram (Left)   Anesthesia type: General   Diagnosis: End stage renal disease (HCC) [N18.6]   Pre-op  diagnosis: L Arm Fistulagram    ANESTHESIA    End Stage Renal   Location: [facility] / [facility]   Providers: Marea Selinda RAMAN, MD        CLINICAL DISCUSSION: [name] is a 63 y.o. female who is submitted for pre-surgical anesthesia review and clearance prior to her undergoing the above procedure. Patient is a Current Smoker (5 pack years). Pertinent PMH includes: CAD (s/p CABG), multiple NSTEMI's, multiple CVAs, chronic cerebral microvascular disease, atrial fibrillation/flutter, ischemic cardiomyopathy with resulting HFrEF, PVD, aortic atherosclerosis, cardiomegaly, RBBB, HTN, HLD, hypothyroidism, ESRD on hemodialysis (M, W, F), pulmonary hypertension, dyspnea, COPD, chronic hypoxic respiratory failure, GERD (on daily PPI), anemia of chronic renal disease, HCV, cervical DDD, right lumbar DDD, renal osteodystrophy, bipolar disorder, substance abuse (crack  cocaine).  Patient is followed by cardiology Orvil, MD). She was last seen in the cardiology clinic on 12/16/2023; notes reviewed. At the time of her clinic visit, patient doing well overall from a cardiovascular perspective.  Patient complained of shortness of breath in the setting of not enough fluid being taken out with dialysis.  Patient denied any chest pain,  PND, orthopnea, palpitations, significant peripheral edema, weakness, fatigue, vertiginous symptoms, or presyncope/syncope. Patient with a past medical history significant for cardiovascular diagnoses. Documented physical exam was grossly benign, providing no evidence of acute exacerbation and/or decompensation of the patient's known cardiovascular conditions.  Patient suffered an NSTEMI on 01/23/2002.  Diagnostic LEFT heart catheterization was performed the following day revealing multivessel CAD with RA determined to be a 90% occlusion in OM2.  PCI was delayed until the following day, at which time patient underwent placement of a 3.0 x 23 mm BX velocity stent to OM2.  Procedure yielded excellent angiographic result and TIMI-3 flow.  MRI imaging of the brain performed on 05/23/2010 revealed BILATERAL acute infarcts and old prior infarcts.  Area of acute insult was noted to the RIGHT frontal lobe extending into the white matter.  Additionally, there was a small focus of restricted diffusion in the cortex of the medial LEFT frontoparietal lobe.  Periventricular T2 and multiple foci of T2 hyperintensity in the subcortical white matter and increased FLAIR signal in the cortex of the LEFT frontal lobe.  Patient underwent diagnostic LEFT heart catheterization on 05/25/2012.  Procedure revealed multivessel CAD; 50% LM, 90% ostial LAD, 50% mid LAD, 50% distal LAD, 75% ostial LCx, 20% ISR proximal LCx, 75% ISR mid LCx, 50% ostial RCA, 25% ISR proximal RCA, 90% mid RCA, 100% RPDA, and 60% RPLS.  Cardiology made the decision to defer further intervention  at that time opting for secondary  prevention through medical management.  Patient suffered an NSTEMI on 10/08/2013.  Diagnostic LEFT heart catheterization was performed on 10/09/2013 revealing multivessel CAD; 25% distal LM, 25% proximal LAD, 50% ISR proximal LCx, 20% proximal RCA, 90% ISR mid RCA-1, and 75% ISR mid RCA-2. Given the degree and complexity of patient's coronary artery disease, patient was referred to CVTS for consideration of revascularization.  Patient ultimately underwent revascularization on 02/20/2014 at [facility].  LIMA-LAD, SVG-OM1, and SVG-PDA bypass grafts were placed.  Procedure was complicated by postoperative CVA on POD 1. Patient developed RIGHT upper/lower extremity weakness.  Patient was reintubated.  Neurology was consulted and CT imaging obtained that showed hypodensity noted in the area of the LEFT motor cortex consistent with a component of acute ischemia.  Symptoms improved and patient was able to be extubated following day.  MRI imaging of the brain performed on 03/03/2014 revealed 2 acute punctate lacunar infarcts anteriorly in the BILATERAL centrum semiovale.   Most recent myocardial perfusion imaging study was performed on 04/01/2023 revealing a severely reduced left ventricular systolic function with an EF of 24%.  Global hypokinesis was observed.  No artifact or left ventricular cavity size enlargement appreciated on review of imaging. SPECT images demonstrated a large severe fixed partially reversible basal and mid anterolateral and anterolateral perfusion defect.  Additionally, there was fixed basal mid and apical inferior wall defects noted.  Results consistent with infarction in the LCx/RCA territories.  Most recent TTE performed on 10/11/2013 revealed a severely reduced left ventricular systolic function with an EF of 35%.  Mild LVH observed.  There was severe hypokinesis of the left ventricular wall noted.  Diastolic Doppler parameters were  indeterminant..There was severe biatrial enlargement.  Right ventricular size and function normal. RVSP = 53 mmHg consistent with patient's known pulmonary hypertension. There was mild pulmonary, moderate tricuspid, and mild mitral valve regurgitation.  All transvalvular gradients were noted to be normal providing no evidence of hemodynamically significant valvular stenosis. Aorta normal in size with no evidence of ectasia or aneurysmal dilatation.  Patient with an atrial fibrillation diagnosis; CHA2DS2-VASc Score = 6 (sex, HFrEF, HTN, CVA x 2, prior MI/vascular disease). Her rate and rhythm are currently being maintained on oral metoprolol  succinate.  Patient is on ranolazine  for anginal symptom prevention.  She is chronically anticoagulated using apixaban ; reported to be compliant with therapy with no evidence or reports of GI/GU bleeding.  Blood pressure well controlled at 90/60 mmHg on currently prescribed beta-blocker (metoprolol  succinate) monotherapy.  Given her hemodialysis and past cardiovascular history, patient with documented labile blood pressures, which is controlled with midodrine .  She is on rosuvastatin  for her HLD diagnosis and further ASCVD prevention.  Patient is not diabetic.  She does not have an OSAH diagnosis. Patient is able to complete all of her  ADL/IADLs without cardiovascular limitation.  Per the DASI, patient is able to achieve at least 4 METS of physical activity without experiencing any significant degree of angina/anginal equivalent symptoms.  No changes were made to her medication regimen.  Patient to follow-up with outpatient cardiology in 2 months or sooner if needed.  Nuvia Hileman Reaney is scheduled for an elective A/V Fistulagram (Left) on 02/07/2024 with Dr. Selinda GORMAN Gu, MD. Given patient's past medical history significant for cardiovascular diagnoses, presurgical cardiac clearance was sought by the PAT team. Per cardiology, this patient is optimized for surgery and may  proceed with the planned procedural course with a LOW risk of significant perioperative cardiovascular complications.  Again,  this patient is on daily oral anticoagulation therapy using a DOAC medication.  Per standing orders from vascular surgery for this procedure, patient will continue apixaban  throughout the perioperative course.  Patient was asked to hold her apixaban  dose on the morning of her procedure only.  Patient denies previous perioperative complications with anesthesia in the past. In review her EMR, it is noted that patient underwent a general anesthetic course here at [facility] ([facility]) in 03/2023 without documented complications.   MOST RECENT VITAL SIGNS:    02/01/2024    9:51 AM 01/20/2024    3:09 PM 01/04/2024    8:58 AM  Vitals with BMI  Height 5' 6 5' 6 5' 6  Weight 160 lbs 161 lbs 161 lbs 3 oz  BMI 25.84 26 26.03  Systolic 83 101 122  Diastolic 65 66 70  Pulse 97 108 101   PROVIDERS/SPECIALISTS: NOTE: Primary physician provider listed below. Patient may have been seen by APP or partner within same practice.   PROVIDER ROLE / SPECIALTY LAST SHERLEAN Marea Selinda GORMAN, MD Vascular Surgery (Surgeon) 01/20/2024  Orlean Alan HERO, FNP Primary Care Provider 11/02/2023  Fernand Alter, MD Cardiology 12/16/2023  Lateef, Munsoor, MD Nephrology seen during inpatient admission 05/2023  Lane Hussar, MD Neurology 06/25/2022   ALLERGIES: Allergies  Allergen Reactions   Morphine  And Codeine Hives   Levaquin [Levofloxacin] Itching    Severe itching; prickly sensation   Enalapril  Other (See Comments)    hallucinations   Latex Rash   Tape Rash and Other (See Comments)    CURRENT HOME MEDICATIONS: No current facility-administered medications for this encounter.    albuterol  (VENTOLIN  HFA) 108 (90 Base) MCG/ACT inhaler   apixaban  (ELIQUIS ) 5 MG TABS tablet   hydrOXYzine  (ATARAX /VISTARIL ) 50 MG tablet   levothyroxine  (SYNTHROID ) 75 MCG  tablet   lidocaine -prilocaine  (EMLA ) cream   metoprolol  succinate (TOPROL -XL) 25 MG 24 hr tablet   midodrine  (PROAMATINE ) 10 MG tablet   mupirocin  ointment (BACTROBAN ) 2 %   oxyCODONE -acetaminophen  (PERCOCET/ROXICET) 5-325 MG tablet   pantoprazole  (PROTONIX ) 40 MG tablet   ranolazine  (RANEXA ) 1000 MG SR tablet   rosuvastatin  (CRESTOR ) 20 MG tablet   sevelamer  carbonate (RENVELA ) 800 MG tablet   triamcinolone  cream (KENALOG ) 0.1 %    heparin  injection   HISTORY: Past Medical History:  Diagnosis Date   Acute lacunar infarction (HCC) 03/03/2014   a.) MRI brain 03/03/2014: two acute punctate lacunar infarcts anteriorly in the BILATERAL centrum semiovale   Adult behavior problems    Anemia of chronic renal failure    Aortic atherosclerosis (HCC)    Atrial fibrillation and flutter (HCC)    a.) CHA2DS2VASc = 6 (sex, HFrEF, HTN, CVA x 2, prior MI/vascular disease) as of 02/04/2024; b.) rate/rhythm maintained on oral metoprolol  succinate; chronically anticoagulated with apixaban    Bipolar disorder (HCC)    Cardiomegaly    Cerebral microvascular disease    Chicken pox    Cholelithiasis    Chronic respiratory failure with hypoxia (HCC) 12/19/2021   COPD (chronic obstructive pulmonary disease) (HCC)    Coronary artery disease    DDD (degenerative disc disease), cervical    DDD (degenerative disc disease), thoracolumbar    Diverticulosis    Dyspnea    ESRD (end stage renal disease) on dialysis (M,W,F)    GERD (gastroesophageal reflux disease)    HCV (hepatitis C virus)    HFrEF (heart failure with reduced ejection fraction) (HCC)    History of delirium  04/12/2014   History of substance abuse (HCC)    a.) tobacco + cocaine   Hyperkalemia 11/13/2014   Hyperlipidemia    Hypertension    Hypotension 02/24/2014   Hypothyroidism    Ischemic cardiomyopathy    Nose colonized with MRSA 02/01/2024   a.) presurgical PCR (+) on 02/01/2024 prior to A/V FISTULAGRAM   NSTEMI (non-ST elevated  myocardial infarction) (HCC) 10/08/2013   NSTEMI (non-ST elevated myocardial infarction) (HCC) 01/23/2002   a.) LHC 01/24/2002 --> multi-vessel CAD with IRA being 90% OM2 --> delayed PCI until 01/25/2002 --> 3.0 x 23 mm BX Velocity stent   Obesity    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    On apixaban  therapy    Peripheral vascular disease (HCC)    Postoperative anemia due to acute blood loss 02/27/2014   Postoperative cerebrovascular infarction following cardiac surgery (HCC) 02/21/2014   a.) developed RIGHT upper/lower extremity weakness following cardiac revascularization --> neuro consult and head CT --> Hypodensity noted in the area of the LEFT motor cortex consistent with a component of acute ischemia   Pulmonary hypertension (HCC)    RBBB (right bundle branch block)    Renal osteodystrophy    S/P CABG x 3 02/20/2014   a.) LIMA-LAD, SVG-OM1, SVG-PDA   Stroke (HCC) 05/23/2010   a.) brain MRI 05/23/2010: BILATERAL acute infarcts and old prior infarcts; RIGHT frontal lobe extending into the white matter. Small focus of restricted diffusion in the cortex of the medial LEFT frontoparietal lobe. Periventricular T2 and multiple foci of T2 hyperintensity in the subcortical white matter and increased FLAIR signal in the cortex of the LEFT frontal lobe.   Syncope and collapse 07/25/2020   Past Surgical History:  Procedure Laterality Date   A/V FISTULAGRAM Left 03/30/2019   Procedure: A/V FISTULAGRAM;  Surgeon: Marea Selinda RAMAN, MD;  Location: [facility];  Service: Cardiovascular;  Laterality: Left;   A/V FISTULAGRAM Left 10/06/2019   Procedure: A/V FISTULAGRAM;  Surgeon: Marea Selinda RAMAN, MD;  Location: [facility];  Service: Cardiovascular;  Laterality: Left;   A/V FISTULAGRAM Left 05/19/2021   Procedure: A/V FISTULAGRAM;  Surgeon: Marea Selinda RAMAN, MD;  Location: [facility];  Service: Cardiovascular;  Laterality: Left;   A/V FISTULAGRAM Left 01/29/2022    Procedure: A/V Fistulagram;  Surgeon: Marea Selinda RAMAN, MD;  Location: [facility];  Service: Cardiovascular;  Laterality: Left;   A/V FISTULAGRAM Left 04/30/2022   Procedure: A/V Fistulagram;  Surgeon: Marea Selinda RAMAN, MD;  Location: [facility];  Service: Cardiovascular;  Laterality: Left;   A/V FISTULAGRAM Left 08/20/2022   Procedure: A/V Fistulagram;  Surgeon: Marea Selinda RAMAN, MD;  Location: [facility];  Service: Cardiovascular;  Laterality: Left;   A/V FISTULAGRAM N/A 12/03/2022   Procedure: A/V Fistulagram;  Surgeon: Marea Selinda RAMAN, MD;  Location: [facility];  Service: Cardiovascular;  Laterality: N/A;   A/V FISTULAGRAM Left 02/11/2023   Procedure: A/V Fistulagram;  Surgeon: Marea Selinda RAMAN, MD;  Location: [facility];  Service: Cardiovascular;  Laterality: Left;   COLONOSCOPY     COLONOSCOPY WITH PROPOFOL  N/A 04/22/2021   Procedure: COLONOSCOPY WITH PROPOFOL ;  Surgeon: Maryruth Ole DASEN, MD;  Location: [facility];  Service: Endoscopy;  Laterality: N/A;   CORONARY ANGIOPLASTY WITH STENT PLACEMENT Left 01/25/2002   Procedure: CORONARY ANGIOPLASTY WITH STENT PLACEMENT; Location: [facility]; Surgeon: Margie Lovelace, MD   CORONARY ARTERY BYPASS GRAFT N/A 02/20/2014   Procedure: CORONARY  ARTERY BYPASS GRAFT; Location: [facility]; Surgeon: Reyes Fruits, MD   DIALYSIS FISTULA CREATION     DIALYSIS/PERMA CATHETER INSERTION N/A 04/08/2023   Procedure: DIALYSIS/PERMA CATHETER INSERTION;  Surgeon: Marea Selinda RAMAN, MD;  Location: [facility];  Service: Cardiovascular;  Laterality: N/A;   DIALYSIS/PERMA CATHETER REMOVAL N/A 07/06/2023   Procedure: DIALYSIS/PERMA CATHETER REMOVAL;  Surgeon: [name] Cordella MATSU, MD;  Location: [facility];  Service: Cardiovascular;  Laterality: N/A;   ESOPHAGOGASTRODUODENOSCOPY     ESOPHAGOGASTRODUODENOSCOPY N/A 04/22/2021   Procedure: ESOPHAGOGASTRODUODENOSCOPY (EGD);  Surgeon: Maryruth Ole DASEN, MD;  Location: [facility]  ENDOSCOPY;  Service: Endoscopy;  Laterality: N/A;   FLEXIBLE SIGMOIDOSCOPY N/A 02/23/2019   Procedure: FLEXIBLE SIGMOIDOSCOPY;  Surgeon: Gaylyn Gladis PENNER, MD;  Location: [facility] ENDOSCOPY;  Service: Endoscopy;  Laterality: N/A;   IABP INSERTION  02/20/2014   Procedure: INTRA-AORTIC BALLOON PUMP INSERTION; Location: [facility]; Surgeon: Reyes Fruits, MD   LEFT HEART CATH AND CORONARY ANGIOGRAPHY Left 01/24/2002   Procedure: LEFT HEART CATH AND CORONARY ANGIOGRAPHY; Location: [facility]; Surgeon: Margie Lovelace, MD   LEFT HEART CATH AND CORONARY ANGIOGRAPHY Left 05/25/2012   Procedure: LEFT HEART CATH AND CORONARY ANGIOGRAPHY; Location: [facility]; Surgeon: Margie Lovelace, MD   LEFT HEART CATH AND CORONARY ANGIOGRAPHY Left 10/09/2013   Procedure: LEFT HEART CATH AND CORONARY ANGIOGRAPHY; Location: [facility]; Surgeon: Vinie Jude, MD   LEFT HEART CATH AND CORS/GRAFTS ANGIOGRAPHY N/A 08/08/2018   Procedure: LEFT HEART CATH AND CORS/GRAFTS ANGIOGRAPHY;  Surgeon: Fernand Denyse LABOR, MD;  Location: [facility];  Service: Cardiovascular;  Laterality: N/A;   LEFT HEART CATH AND CORS/GRAFTS ANGIOGRAPHY N/A 01/30/2019   Procedure: LEFT HEART CATH AND CORS/GRAFTS ANGIOGRAPHY;  Surgeon: Fernand Denyse LABOR, MD;  Location: [facility];  Service: Cardiovascular;  Laterality: N/A;   ultrasound guided pericardiocentesis     Family History  Problem Relation Age of Onset   Hypertension Mother    Ovarian cancer Mother    Diabetes type II Mother    Hypertension Father    Kidney disease Father    Hypertension Sister    Diabetes type II Maternal Grandmother    Breast cancer Maternal Grandmother    Breast cancer Maternal Aunt    Hypertension Other    Cancer Other    Renal Disease Other    Social History   Tobacco Use   Smoking status: Some Days    Current packs/day: 0.25    Average packs/day: 0.3 packs/day for 20.0 years (5.0 ttl pk-yrs)    Types: Cigarettes   Smokeless tobacco: Never   Tobacco comments:     smokes two cigarettes a day  Substance Use Topics   Alcohol use: No    Comment: social   LABS:  Hospital Outpatient Visit on 02/01/2024  Component Date Value Ref Range Status   ABO/RH(D) 02/01/2024 O POS   Final   Antibody Screen 02/01/2024 POS   Final   Sample Expiration 02/01/2024 02/15/2024,2359   Final   Extend sample reason 02/01/2024 NO TRANSFUSIONS OR PREGNANCY IN THE PAST 3 MONTHS   Final   Antibody Identification 02/01/2024 NON SPECIFIC ANTIBODY REACTIVITY   Final   DAT, IgG 02/01/2024 NEG   Final   DAT, complement 02/01/2024    Final                   Value:NEG Performed at [facility], [address] [place], [place] [address]    WBC 02/01/2024 4.9  4.0 - 10.5 K/uL Final  RBC 02/01/2024 4.19  3.87 - 5.11 MIL/uL Final   Hemoglobin 02/01/2024 13.5  12.0 - 15.0 g/dL Final   HCT 91/80/7974 41.2  36.0 - 46.0 % Final   MCV 02/01/2024 98.3  80.0 - 100.0 fL Final   MCH 02/01/2024 32.2  26.0 - 34.0 pg Final   MCHC 02/01/2024 32.8  30.0 - 36.0 g/dL Final   RDW 91/80/7974 15.6 (H)  11.5 - 15.5 % Final   Platelets 02/01/2024 116 (L)  150 - 400 K/uL Final   nRBC 02/01/2024 0.0  0.0 - 0.2 % Final   Performed at [facility], [address] [place], [place] [address]   Sodium 02/01/2024 138  135 - 145 mmol/L Final   Potassium 02/01/2024 4.3  3.5 - 5.1 mmol/L Final   Chloride 02/01/2024 97 (L)  98 - 111 mmol/L Final   CO2 02/01/2024 24  22 - 32 mmol/L Final   Glucose, Bld 02/01/2024 72  70 - 99 mg/dL Final   Glucose reference range applies only to samples taken after fasting for at least 8 hours.   BUN 02/01/2024 46 (H)  8 - 23 mg/dL Final   Creatinine, Ser 02/01/2024 7.02 (H)  0.44 - 1.00 mg/dL Final   Calcium  02/01/2024 8.5 (L)  8.9 - 10.3 mg/dL Final   GFR, Estimated 02/01/2024 6 (L)  >60 mL/min Final   Comment: (NOTE) Calculated using the CKD-EPI Creatinine Equation (2021)    Anion gap 02/01/2024 17 (H)  5 - 15 Final   Performed at [facility], [address] [place], [place] [address]   MRSA, PCR 02/01/2024 POSITIVE (A)  NEGATIVE Final   Comment: RESULT CALLED TO, READ BACK BY AND VERIFIED WITH: Arra Connaughton AT 1416 ON 02/01/24 BY SS    Staphylococcus aureus 02/01/2024 POSITIVE (A)  NEGATIVE Final   Comment: (NOTE) The Xpert SA Assay (FDA approved for NASAL specimens in patients 28 years of age and older), is one component of a comprehensive surveillance program. It is not intended to diagnose infection nor to guide or monitor treatment. Performed at [facility], [address] [place], [place] [address]     ECG: Date: 08/27/2023 Time ECG obtained: 1016 AM Rate: 85 bpm Rhythm: Sinus rhythm with frequent PACs and PVCs; RBBB Axis (leads I and aVF): left Intervals: PR 202 ms. QRS 168 ms. QTc 559 ms. ST segment and T wave changes: No evidence of acute T wave abnormalities or significant ST segment elevation or depression.  Evidence of a possible, age undetermined, prior infarct:  No Comparison: Similar to previous tracing obtained on 05/19/2023 NOTE: Tracing obtained at [facility]; unable for review. Above based on cardiologist's interpretation.    IMAGING / PROCEDURES: TRANSTHORACIC ECHOCARDIOGRAM performed on 10/12/2023 Technically adequate study.  Severe biatrial dilatation  Severe left ventricular systolic dysfunction.  Mild left ventricular hypertrophy with indeterminate diastolic function due to in atrial fibrillation.  Severe hypokinesis shown in left ventricular wall motion.  Mild pulmonary regurgitation.  Moderate tricuspid regurgitation.  Severe pulmonary hypertension.  Mild mitral regurgitation.  No aortic regurgitation.  No pericardial effusion.   CT ANGIO HEAD W OR WO CONTRAST performed on 09/30/2023 No acute intracranial abnormality.   Multiple chronic infarcts. Diminished flow within the visualized extracranial left vertebral artery. Absent or minimal flow within the intracranial left  vertebral with reconstitution distally from retrograde flow. This reflects a change from 2016 MRA head.   MYOCARDIAL PERFUSION IMAGING STUDY (LEXISCAN ) performed on 04/01/2023 Severely reduced left ventricular  systolic function with an EF of 24% Dilated left ventricle Diffuse hypokinesis Large severe fixed partially reversible basal, mid anterolateral, and anterolateral wall perfusion defects Fixed basal mid and apical inferior wall perfusion defect Findings consistent with infarction in the LCx/RCA territories with severe LV dysfunction, dilated left ventricle, and diffuse hypokinesis.  CT CERVICAL/THORACIC/LUMBAR SPINE WO CONTRAST performed on 02/16/2022 No acute intracranial CT findings or intracranial interval changes. Bilateral chronic infarcts, atrophy and small-vessel disease. Large right frontotemporal scalp hematoma with swelling continuing over the right preorbital area. No evidence of acute facial fracture, and no fracture seen in the cervical, thoracic and lumbar spine. Degenerative changes detailed above. Dense bones consistent with renal osteodystrophy. Regional skeletal fracture is seen elsewhere. Moderate to severe panchamber cardiomegaly with coronary artery and aortic atherosclerosis. In the abdomen with extensive aortoiliac and branch vessel after sclerosis as well. Chronic changes in the lungs.  No acute infiltrate or contusion. Moderate to large volume free ascites increased since 01/30/2022. No free hemorrhage or free air is seen. Cholelithiasis. Constipation and diverticulosis.  IMPRESSION AND PLAN: [name] has been referred for pre-anesthesia review and clearance prior to her undergoing the planned anesthetic and procedural courses. Available labs, pertinent testing, and imaging results were personally reviewed by me in preparation for upcoming operative/procedural course. [facility] medical record has been updated following extensive record review and  patient interview with PAT staff.   Preoperative labs demonstrated (+) MRSA results.  Rx for nasal decolonization using mupirocin  was sent to patient's pharmacy.  Pharmacy consult entered for preoperative vancomycin dosing due to severely reduced renal function and patient being on hemodialysis.  Patient is also scheduled to receive cefazolin  with her procedure.  This patient has been appropriately cleared by cardiology with an overall LOW risk of patient experiencing significant perioperative cardiovascular complications. Based on clinical review performed today (02/04/24), barring any significant acute changes in the patient's overall condition, it is anticipated that she will be able to proceed with the planned surgical intervention. Any acute changes in clinical condition may necessitate her procedure being postponed and/or cancelled. Patient will meet with anesthesia team (MD and/or CRNA) on the day of her procedure for preoperative evaluation/assessment. Questions regarding anesthetic course will be fielded at that time.   Pre-surgical instructions were reviewed with the patient during his PAT appointment, and questions were fielded to satisfaction by PAT clinical staff. She has been instructed on which medications that she will need to hold prior to surgery, as well as the ones that have been deemed safe/appropriate to take on the day of her procedure. As part of the general education provided by PAT, patient made aware both verbally and in writing, that she would need to abstain from the use of any illegal substances during her perioperative course. She was advised that failure to follow the provided instructions could necessitate case cancellation or result in serious perioperative complications up to and including death. Patient encouraged to contact PAT and/or her surgeon's office to discuss any questions or concerns that may arise prior to surgery; verbalized understanding.   Dorise Pereyra, MSN,  APRN, FNP-C, CEN [facility]  Perioperative Services Nurse Practitioner Phone: [phone] Fax: [phone] 02/04/24 2:24 PM  NOTE: This note has been prepared using Dragon dictation software. Despite my best ability to proofread, there is always the potential that unintentional transcriptional errors may still occur from this process.

## 2024-02-07 ENCOUNTER — Ambulatory Visit
Admission: RE | Admit: 2024-02-07 | Discharge: 2024-02-07 | Disposition: A | Discharge status: Home / Self Care | Attending: Vascular Surgery | Admitting: Vascular Surgery

## 2024-02-07 ENCOUNTER — Ambulatory Visit: Payer: Self-pay | Admitting: Urgent Care

## 2024-02-07 ENCOUNTER — Encounter
Admission: RE | Disposition: A | Discharge status: Home / Self Care | Payer: Self-pay | Source: Home / Self Care | Attending: Vascular Surgery

## 2024-02-07 ENCOUNTER — Other Ambulatory Visit: Payer: Self-pay

## 2024-02-07 ENCOUNTER — Encounter: Payer: Self-pay | Admitting: Vascular Surgery

## 2024-02-07 DIAGNOSIS — E039 Hypothyroidism, unspecified: Secondary | ICD-10-CM | POA: Insufficient documentation

## 2024-02-07 DIAGNOSIS — Y832 Surgical operation with anastomosis, bypass or graft as the cause of abnormal reaction of the patient, or of later complication, without mention of misadventure at the time of the procedure: Secondary | ICD-10-CM | POA: Insufficient documentation

## 2024-02-07 DIAGNOSIS — E785 Hyperlipidemia, unspecified: Secondary | ICD-10-CM | POA: Diagnosis not present

## 2024-02-07 DIAGNOSIS — Z951 Presence of aortocoronary bypass graft: Secondary | ICD-10-CM | POA: Diagnosis not present

## 2024-02-07 DIAGNOSIS — Z8673 Personal history of transient ischemic attack (TIA), and cerebral infarction without residual deficits: Secondary | ICD-10-CM | POA: Diagnosis not present

## 2024-02-07 DIAGNOSIS — I509 Heart failure, unspecified: Secondary | ICD-10-CM | POA: Diagnosis not present

## 2024-02-07 DIAGNOSIS — I2510 Atherosclerotic heart disease of native coronary artery without angina pectoris: Secondary | ICD-10-CM | POA: Diagnosis not present

## 2024-02-07 DIAGNOSIS — I252 Old myocardial infarction: Secondary | ICD-10-CM | POA: Diagnosis not present

## 2024-02-07 DIAGNOSIS — I739 Peripheral vascular disease, unspecified: Secondary | ICD-10-CM | POA: Diagnosis not present

## 2024-02-07 DIAGNOSIS — Z992 Dependence on renal dialysis: Secondary | ICD-10-CM | POA: Diagnosis not present

## 2024-02-07 DIAGNOSIS — Z79899 Other long term (current) drug therapy: Secondary | ICD-10-CM | POA: Diagnosis not present

## 2024-02-07 DIAGNOSIS — T82858A Stenosis of vascular prosthetic devices, implants and grafts, initial encounter: Secondary | ICD-10-CM

## 2024-02-07 DIAGNOSIS — Z9889 Other specified postprocedural states: Secondary | ICD-10-CM

## 2024-02-07 DIAGNOSIS — I5020 Unspecified systolic (congestive) heart failure: Secondary | ICD-10-CM | POA: Diagnosis not present

## 2024-02-07 DIAGNOSIS — J449 Chronic obstructive pulmonary disease, unspecified: Secondary | ICD-10-CM | POA: Diagnosis not present

## 2024-02-07 DIAGNOSIS — N186 End stage renal disease: Secondary | ICD-10-CM

## 2024-02-07 DIAGNOSIS — Z22322 Carrier or suspected carrier of Methicillin resistant Staphylococcus aureus: Secondary | ICD-10-CM

## 2024-02-07 DIAGNOSIS — F17210 Nicotine dependence, cigarettes, uncomplicated: Secondary | ICD-10-CM | POA: Diagnosis not present

## 2024-02-07 DIAGNOSIS — Z01812 Encounter for preprocedural laboratory examination: Secondary | ICD-10-CM

## 2024-02-07 DIAGNOSIS — I132 Hypertensive heart and chronic kidney disease with heart failure and with stage 5 chronic kidney disease, or end stage renal disease: Secondary | ICD-10-CM | POA: Diagnosis not present

## 2024-02-07 DIAGNOSIS — F17200 Nicotine dependence, unspecified, uncomplicated: Secondary | ICD-10-CM | POA: Diagnosis not present

## 2024-02-07 HISTORY — DX: Bipolar disorder, unspecified: F31.9

## 2024-02-07 HISTORY — DX: Renal osteodystrophy: N25.0

## 2024-02-07 HISTORY — PX: A/V FISTULAGRAM: CATH118298

## 2024-02-07 HISTORY — DX: Unspecified right bundle-branch block: I45.10

## 2024-02-07 HISTORY — DX: Chronic obstructive pulmonary disease, unspecified: J44.9

## 2024-02-07 HISTORY — DX: Hypothyroidism, unspecified: E03.9

## 2024-02-07 HISTORY — DX: Other intervertebral disc degeneration, thoracolumbar region: M51.35

## 2024-02-07 HISTORY — DX: Unspecified atrial fibrillation: I48.91

## 2024-02-07 HISTORY — DX: Long term (current) use of anticoagulants: Z79.01

## 2024-02-07 HISTORY — DX: Dependence on renal dialysis: Z99.2

## 2024-02-07 HISTORY — DX: Dyspnea, unspecified: R06.00

## 2024-02-07 HISTORY — DX: End stage renal disease: N18.6

## 2024-02-07 HISTORY — DX: Unspecified viral hepatitis C without hepatic coma: B19.20

## 2024-02-07 HISTORY — DX: Diverticulosis of intestine, part unspecified, without perforation or abscess without bleeding: K57.90

## 2024-02-07 HISTORY — DX: Pulmonary hypertension, unspecified: I27.20

## 2024-02-07 HISTORY — DX: Other cervical disc degeneration, unspecified cervical region: M50.30

## 2024-02-07 HISTORY — DX: Ischemic cardiomyopathy: I25.5

## 2024-02-07 HISTORY — DX: Calculus of gallbladder without cholecystitis without obstruction: K80.20

## 2024-02-07 HISTORY — DX: Other cerebrovascular disease: I67.89

## 2024-02-07 HISTORY — DX: Other psychoactive substance abuse, in remission: F19.11

## 2024-02-07 HISTORY — DX: Cardiomegaly: I51.7

## 2024-02-07 HISTORY — DX: Atherosclerosis of aorta: I70.0

## 2024-02-07 HISTORY — DX: Unspecified systolic (congestive) heart failure: I50.20

## 2024-02-07 LAB — POTASSIUM: Potassium: 5.4 mmol/L — ABNORMAL HIGH (ref 3.5–5.1)

## 2024-02-07 LAB — POTASSIUM (ARMC VASCULAR LAB ONLY): Potassium (ARMC vascular lab): 5.7 mmol/L — ABNORMAL HIGH (ref 3.5–5.1)

## 2024-02-07 SURGERY — A/V FISTULAGRAM
Anesthesia: General | Laterality: Left

## 2024-02-07 MED ORDER — FENTANYL CITRATE (PF) 100 MCG/2ML IJ SOLN
INTRAMUSCULAR | Status: AC
  Filled 2024-02-07: qty 2

## 2024-02-07 MED ORDER — OXYCODONE HCL 5 MG/5ML PO SOLN: 5.0000 mg | Freq: Once | ORAL | Status: DC | PRN

## 2024-02-07 MED ORDER — HYDROMORPHONE HCL 1 MG/ML IJ SOLN: 1.0000 mg | Freq: Once | INTRAMUSCULAR | Status: DC | PRN

## 2024-02-07 MED ORDER — FENTANYL CITRATE (PF) 100 MCG/2ML IJ SOLN
INTRAMUSCULAR | Status: DC | PRN
  Administered 2024-02-07: 25 ug via INTRAVENOUS

## 2024-02-07 MED ORDER — PROPOFOL 500 MG/50ML IV EMUL
INTRAVENOUS | Status: DC | PRN
  Administered 2024-02-07: 80 ug/kg/min via INTRAVENOUS
  Administered 2024-02-07: 40 mg via INTRAVENOUS

## 2024-02-07 MED ORDER — HEPARIN (PORCINE) IN NACL 1000-0.9 UT/500ML-% IV SOLN
INTRAVENOUS | Status: DC | PRN
  Administered 2024-02-07: 500 mL

## 2024-02-07 MED ORDER — MIDAZOLAM HCL 2 MG/2ML IJ SOLN
INTRAMUSCULAR | Status: AC
  Filled 2024-02-07: qty 2

## 2024-02-07 MED ORDER — CEFAZOLIN SODIUM-DEXTROSE 1-4 GM/50ML-% IV SOLN
1.0000 g | INTRAVENOUS | Status: AC
  Administered 2024-02-07: 1 g via INTRAVENOUS

## 2024-02-07 MED ORDER — CHLORHEXIDINE GLUCONATE 0.12 % MT SOLN
15.0000 mL | Freq: Once | OROMUCOSAL | Status: DC
  Filled 2024-02-07: qty 15

## 2024-02-07 MED ORDER — CEFAZOLIN SODIUM-DEXTROSE 1-4 GM/50ML-% IV SOLN
INTRAVENOUS | Status: AC
  Filled 2024-02-07: qty 50

## 2024-02-07 MED ORDER — ROCURONIUM BROMIDE 100 MG/10ML IV SOLN
INTRAVENOUS | Status: DC | PRN
  Administered 2024-02-07: 50 mg via INTRAVENOUS

## 2024-02-07 MED ORDER — LIDOCAINE HCL (CARDIAC) PF 100 MG/5ML IV SOSY
PREFILLED_SYRINGE | INTRAVENOUS | Status: DC | PRN
  Administered 2024-02-07: 40 mg via INTRAVENOUS

## 2024-02-07 MED ORDER — FAMOTIDINE 20 MG PO TABS: 40.0000 mg | ORAL_TABLET | Freq: Once | ORAL | Status: DC | PRN

## 2024-02-07 MED ORDER — DEXAMETHASONE SODIUM PHOSPHATE 10 MG/ML IJ SOLN
INTRAMUSCULAR | Status: DC | PRN
  Administered 2024-02-07: 5 mg via INTRAVENOUS

## 2024-02-07 MED ORDER — PROPOFOL 10 MG/ML IV BOLUS
INTRAVENOUS | Status: AC
Start: 2024-02-07 — End: 2024-02-07
  Filled 2024-02-07: qty 20

## 2024-02-07 MED ORDER — SODIUM CHLORIDE 0.9 % IV SOLN: INTRAVENOUS | Status: DC

## 2024-02-07 MED ORDER — PHENYLEPHRINE 80 MCG/ML (10ML) SYRINGE FOR IV PUSH (FOR BLOOD PRESSURE SUPPORT)
PREFILLED_SYRINGE | INTRAVENOUS | Status: DC | PRN
Start: 2024-02-07 — End: 2024-02-07
  Administered 2024-02-07 (×8): 80 ug via INTRAVENOUS

## 2024-02-07 MED ORDER — LIDOCAINE-EPINEPHRINE (PF) 1 %-1:200000 IJ SOLN
INTRAMUSCULAR | Status: DC | PRN
  Administered 2024-02-07: 10 mL

## 2024-02-07 MED ORDER — OXYCODONE HCL 5 MG PO TABS: 5.0000 mg | ORAL_TABLET | Freq: Once | ORAL | Status: DC | PRN

## 2024-02-07 MED ORDER — ROCURONIUM BROMIDE 10 MG/ML (PF) SYRINGE
PREFILLED_SYRINGE | INTRAVENOUS | Status: AC
  Filled 2024-02-07: qty 10

## 2024-02-07 MED ORDER — ONDANSETRON HCL 4 MG/2ML IJ SOLN
INTRAMUSCULAR | Status: AC
  Filled 2024-02-07: qty 2

## 2024-02-07 MED ORDER — PHENYLEPHRINE HCL-NACL 20-0.9 MG/250ML-% IV SOLN
INTRAVENOUS | Status: AC
  Filled 2024-02-07: qty 250

## 2024-02-07 MED ORDER — MIDAZOLAM HCL 2 MG/ML PO SYRP: 8.0000 mg | ORAL_SOLUTION | Freq: Once | ORAL | Status: DC | PRN

## 2024-02-07 MED ORDER — SUGAMMADEX SODIUM 200 MG/2ML IV SOLN
INTRAVENOUS | Status: DC | PRN
Start: 2024-02-07 — End: 2024-02-07
  Administered 2024-02-07: 300 mg via INTRAVENOUS

## 2024-02-07 MED ORDER — VANCOMYCIN HCL 1000 MG/200ML IV SOLN
1000.0000 mg | Freq: Once | INTRAVENOUS | Status: DC
  Filled 2024-02-07: qty 200

## 2024-02-07 MED ORDER — DEXAMETHASONE SODIUM PHOSPHATE 10 MG/ML IJ SOLN
INTRAMUSCULAR | Status: AC
  Filled 2024-02-07: qty 1

## 2024-02-07 MED ORDER — DIPHENHYDRAMINE HCL 50 MG/ML IJ SOLN: 50.0000 mg | Freq: Once | INTRAMUSCULAR | Status: DC | PRN

## 2024-02-07 MED ORDER — ONDANSETRON HCL 4 MG/2ML IJ SOLN: 4.0000 mg | Freq: Four times a day (QID) | INTRAMUSCULAR | Status: DC | PRN

## 2024-02-07 MED ORDER — ONDANSETRON HCL 4 MG/2ML IJ SOLN
INTRAMUSCULAR | Status: DC | PRN
  Administered 2024-02-07: 4 mg via INTRAVENOUS

## 2024-02-07 MED ORDER — METHYLPREDNISOLONE SODIUM SUCC 125 MG IJ SOLR: 125.0000 mg | Freq: Once | INTRAMUSCULAR | Status: DC | PRN

## 2024-02-07 MED ORDER — HEPARIN SODIUM (PORCINE) 1000 UNIT/ML IJ SOLN
INTRAMUSCULAR | Status: DC | PRN
  Administered 2024-02-07: 3000 [IU] via INTRAVENOUS

## 2024-02-07 MED ORDER — MIDAZOLAM HCL 2 MG/2ML IJ SOLN
INTRAMUSCULAR | Status: DC | PRN
  Administered 2024-02-07: 2 mg via INTRAVENOUS

## 2024-02-07 MED ORDER — IODIXANOL 320 MG/ML IV SOLN
INTRAVENOUS | Status: DC | PRN
  Administered 2024-02-07: 25 mL

## 2024-02-07 MED ORDER — LIDOCAINE HCL (PF) 2 % IJ SOLN
INTRAMUSCULAR | Status: AC
  Filled 2024-02-07: qty 5

## 2024-02-07 MED ORDER — ORAL CARE MOUTH RINSE: 15.0000 mL | Freq: Once | OROMUCOSAL | Status: DC

## 2024-02-07 MED ORDER — GLYCOPYRROLATE 0.2 MG/ML IJ SOLN
INTRAMUSCULAR | Status: DC | PRN
  Administered 2024-02-07: .1 mg via INTRAVENOUS

## 2024-02-07 MED ORDER — FENTANYL CITRATE (PF) 100 MCG/2ML IJ SOLN: 25.0000 ug | INTRAMUSCULAR | Status: DC | PRN

## 2024-02-07 SURGICAL SUPPLY — 10 items
BALLOON LUTONIX AV 8X60X75 (BALLOONS) IMPLANT
BALLOON ULTRVRSE 10X40X75 (BALLOONS) IMPLANT
COVER PROBE ULTRASOUND 5X96 (MISCELLANEOUS) IMPLANT
DEVICE PRESTO INFLATION (MISCELLANEOUS) IMPLANT
DRAPE BRACHIAL (DRAPES) IMPLANT
KIT MICROPUNCTURE VSI 5F STIFF (SHEATH) IMPLANT
PACK ANGIOGRAPHY (CUSTOM PROCEDURE TRAY) ×1 IMPLANT
SHEATH BRITE TIP 6FRX5.5 (SHEATH) IMPLANT
SUT MNCRL AB 4-0 PS2 18 (SUTURE) IMPLANT
WIRE SUPRACORE 190CM (WIRE) IMPLANT

## 2024-02-07 NOTE — Anesthesia Postprocedure Evaluation (Signed)
 Anesthesia Post Note  Patient: [name]  Procedure(s) Performed: A/V Fistulagram (Left)  Patient location during evaluation: PACU Anesthesia Type: General Level of consciousness: awake and alert Pain management: pain level controlled Vital Signs Assessment: post-procedure vital signs reviewed and stable Respiratory status: spontaneous breathing, nonlabored ventilation and respiratory function stable Cardiovascular status: blood pressure returned to baseline and stable Postop Assessment: no apparent nausea or vomiting Anesthetic complications: no   No notable events documented.   Last Vitals:  Vitals:   02/07/24 1151 02/07/24 1215  BP:  (!) 80/55  Pulse: 94 95  Resp: (!) 22 16  Temp:    SpO2: 96%     Last Pain:  Vitals:   02/07/24 1215  TempSrc:   PainSc: 0-No pain                 Fairy POUR Lilias Lorensen

## 2024-02-07 NOTE — Interval H&P Note (Signed)
 History and Physical Interval Note:  02/07/2024 8:19 AM  [name]  has presented today for surgery, with the diagnosis of L Arm Fistulagram    ANESTHESIA    End Stage Renal.  The various methods of treatment have been discussed with the patient and family. After consideration of risks, benefits and other options for treatment, the patient has consented to  Procedure(s): A/V Fistulagram (Left) as a surgical intervention.  The patient's history has been reviewed, patient examined, no change in status, stable for surgery.  I have reviewed the patient's chart and labs.  Questions were answered to the patient's satisfaction.     Fayrene Towner

## 2024-02-07 NOTE — Anesthesia Preprocedure Evaluation (Signed)
 Anesthesia Evaluation  Patient identified by MRN, date of birth, ID band Patient awake    Reviewed: Allergy & Precautions, NPO status , Patient's Chart, lab work & pertinent test results  History of Anesthesia Complications Negative for: history of anesthetic complications  Airway Mallampati: III  TM Distance: <3 FB Neck ROM: full    Dental  (+) Chipped, Poor Dentition, Missing   Pulmonary shortness of breath and with exertion, COPD, Current Smoker and Patient abstained from smoking.   Pulmonary exam normal        Cardiovascular hypertension, + CAD, + Past MI, + Peripheral Vascular Disease and +CHF  Normal cardiovascular exam+ dysrhythmias      Neuro/Psych  Headaches PSYCHIATRIC DISORDERS      CVA, Residual Symptoms    GI/Hepatic ,GERD  Controlled,,(+) Hepatitis -  Endo/Other  Hypothyroidism    Renal/GU Renal disease  negative genitourinary   Musculoskeletal   Abdominal   Peds  Hematology negative hematology ROS (+)   Anesthesia Other Findings Past Medical History: 03/03/2014: Acute lacunar infarction [facility])     Comment:  a.) MRI brain 03/03/2014: two acute punctate lacunar               infarcts anteriorly in the BILATERAL centrum semiovale No date: Adult behavior problems No date: Anemia of chronic renal failure No date: Aortic atherosclerosis (HCC) No date: Atrial fibrillation and flutter (HCC)     Comment:  a.) CHA2DS2VASc = 6 (sex, HFrEF, HTN, CVA x 2, prior               MI/vascular disease) as of 02/04/2024; b.) rate/rhythm               maintained on oral metoprolol  succinate; chronically               anticoagulated with apixaban  No date: Bipolar disorder (HCC) No date: Cardiomegaly No date: Cerebral microvascular disease No date: Chicken pox No date: Cholelithiasis 12/19/2021: Chronic respiratory failure with hypoxia (HCC) No date: COPD (chronic obstructive pulmonary disease) (HCC) No date:  Coronary artery disease No date: DDD (degenerative disc disease), cervical No date: DDD (degenerative disc disease), thoracolumbar No date: Diverticulosis No date: Dyspnea No date: ESRD (end stage renal disease) on dialysis (M,W,F) No date: GERD (gastroesophageal reflux disease) No date: HCV (hepatitis C virus) No date: HFrEF (heart failure with reduced ejection fraction) (HCC) 04/12/2014: History of delirium No date: History of substance abuse (HCC)     Comment:  a.) tobacco + cocaine 11/13/2014: Hyperkalemia No date: Hyperlipidemia No date: Hypertension 02/24/2014: Hypotension No date: Hypothyroidism No date: Ischemic cardiomyopathy 02/01/2024: Nose colonized with MRSA     Comment:  a.) presurgical PCR (+) on 02/01/2024 prior to A/V               FISTULAGRAM 10/08/2013: NSTEMI (non-ST elevated myocardial infarction) (HCC) 01/23/2002: NSTEMI (non-ST elevated myocardial infarction) [facility])     Comment:  a.) LHC 01/24/2002 --> multi-vessel CAD with IRA being               90% OM2 --> delayed PCI until 01/25/2002 --> 3.0 x 23 mm               BX Velocity stent No date: Obesity No date: 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 No date: On apixaban  therapy No date: Peripheral vascular disease (HCC) 02/27/2014: Postoperative anemia due to acute blood loss 02/21/2014: Postoperative cerebrovascular infarction following  cardiac surgery (HCC)     Comment:  a.) developed  RIGHT upper/lower extremity weakness               following cardiac revascularization --> neuro consult and              head CT --> Hypodensity noted in the area of the LEFT               motor cortex consistent with a component of acute               ischemia No date: Pulmonary hypertension (HCC) No date: RBBB (right bundle branch block) No date: Renal osteodystrophy 02/20/2014: S/P CABG x 3     Comment:  a.) LIMA-LAD, SVG-OM1, SVG-PDA 05/23/2010: Stroke (HCC)     Comment:  a.) brain MRI 05/23/2010:  BILATERAL acute infarcts and               old prior infarcts; RIGHT frontal lobe extending into the              white matter. Small focus of restricted diffusion in the               cortex of the medial LEFT frontoparietal lobe.               Periventricular T2 and multiple foci of T2 hyperintensity              in the subcortical white matter and increased FLAIR               signal in the cortex of the LEFT frontal lobe. 07/25/2020: Syncope and collapse  Past Surgical History: 03/30/2019: A/V FISTULAGRAM; Left     Comment:  Procedure: A/V FISTULAGRAM;  Surgeon: Marea Selinda RAMAN, MD;               Location: [facility];  Service: Cardiovascular;              Laterality: Left; 10/06/2019: A/V FISTULAGRAM; Left     Comment:  Procedure: A/V FISTULAGRAM;  Surgeon: Marea Selinda RAMAN, MD;               Location: [facility];  Service: Cardiovascular;              Laterality: Left; 05/19/2021: A/V FISTULAGRAM; Left     Comment:  Procedure: A/V FISTULAGRAM;  Surgeon: Marea Selinda RAMAN, MD;               Location: [facility];  Service: Cardiovascular;              Laterality: Left; 01/29/2022: A/V FISTULAGRAM; Left     Comment:  Procedure: A/V Fistulagram;  Surgeon: Marea Selinda RAMAN, MD;               Location: [facility];  Service: Cardiovascular;              Laterality: Left; 04/30/2022: A/V FISTULAGRAM; Left     Comment:  Procedure: A/V Fistulagram;  Surgeon: Marea Selinda RAMAN, MD;               Location: [facility];  Service: Cardiovascular;              Laterality: Left; 08/20/2022: A/V FISTULAGRAM; Left     Comment:  Procedure: A/V Fistulagram;  Surgeon: Marea Selinda RAMAN, MD;               Location: [facility];  Service: Cardiovascular;  Laterality: Left; 12/03/2022: A/V FISTULAGRAM; N/A     Comment:  Procedure: A/V Fistulagram;  Surgeon: Marea Selinda RAMAN, MD;               Location: [facility];  Service: Cardiovascular;               Laterality: N/A; 02/11/2023: A/V FISTULAGRAM; Left     Comment:  Procedure: A/V Fistulagram;  Surgeon: Marea Selinda RAMAN, MD;               Location: [facility];  Service: Cardiovascular;              Laterality: Left; No date: COLONOSCOPY 04/22/2021: COLONOSCOPY WITH PROPOFOL ; N/A     Comment:  Procedure: COLONOSCOPY WITH PROPOFOL ;  Surgeon:               Maryruth Ole DASEN, MD;  Location: [facility];                Service: Endoscopy;  Laterality: N/A; 01/25/2002: CORONARY ANGIOPLASTY WITH STENT PLACEMENT; Left     Comment:  Procedure: CORONARY ANGIOPLASTY WITH STENT PLACEMENT;               Location: [facility]; Surgeon: Margie Lovelace, MD 02/20/2014: CORONARY ARTERY BYPASS GRAFT; N/A     Comment:  Procedure: CORONARY ARTERY BYPASS GRAFT; Location: [facility];              Surgeon: Reyes Fruits, MD No date: DIALYSIS FISTULA CREATION 04/08/2023: DIALYSIS/PERMA CATHETER INSERTION; N/A     Comment:  Procedure: DIALYSIS/PERMA CATHETER INSERTION;  Surgeon:               Marea Selinda RAMAN, MD;  Location: [facility];                Service: Cardiovascular;  Laterality: N/A; 07/06/2023: DIALYSIS/PERMA CATHETER REMOVAL; N/A     Comment:  Procedure: DIALYSIS/PERMA CATHETER REMOVAL;  Surgeon:               [name] Cordella MATSU, MD;  Location: [facility];               Service: Cardiovascular;  Laterality: N/A; No date: ESOPHAGOGASTRODUODENOSCOPY 04/22/2021: ESOPHAGOGASTRODUODENOSCOPY; N/A     Comment:  Procedure: ESOPHAGOGASTRODUODENOSCOPY (EGD);  Surgeon:               Maryruth Ole DASEN, MD;  Location: [facility] ENDOSCOPY;                Service: Endoscopy;  Laterality: N/A; 02/23/2019: FLEXIBLE SIGMOIDOSCOPY; N/A     Comment:  Procedure: FLEXIBLE SIGMOIDOSCOPY;  Surgeon: Gaylyn Gladis PENNER, MD;  Location: [facility] END[facility];  Service:               Endoscopy;  Laterality: N/A; 02/20/2014: IABP INSERTION     Comment:  Procedure: INTRA-AORTIC BALLOON PUMP INSERTION;                Location: [facility]; Surgeon: Reyes Fruits, MD 01/24/2002: LEFT HEART CATH AND CORONARY ANGIOGRAPHY; Left     Comment:  Procedure: LEFT HEART CATH AND CORONARY ANGIOGRAPHY;               Location: [facility]; Surgeon: Margie Lovelace, MD 05/25/2012: LEFT HEART CATH AND CORONARY ANGIOGRAPHY; Left     Comment:  Procedure: LEFT HEART CATH AND CORONARY ANGIOGRAPHY;  Location: [facility]; Surgeon: Margie Lovelace, MD 10/09/2013: LEFT HEART CATH AND CORONARY ANGIOGRAPHY; Left     Comment:  Procedure: LEFT HEART CATH AND CORONARY ANGIOGRAPHY;               Location: [facility]; Surgeon: Vinie Jude, MD 08/08/2018: LEFT HEART CATH AND CORS/GRAFTS ANGIOGRAPHY; N/A     Comment:  Procedure: LEFT HEART CATH AND CORS/GRAFTS ANGIOGRAPHY;               Surgeon: Fernand Denyse LABOR, MD;  Location: [facility]              [facility];  Service: Cardiovascular;  Laterality: N/A; 01/30/2019: LEFT HEART CATH AND CORS/GRAFTS ANGIOGRAPHY; N/A     Comment:  Procedure: LEFT HEART CATH AND CORS/GRAFTS ANGIOGRAPHY;               Surgeon: Fernand Denyse LABOR, MD;  Location: [facility]              [facility];  Service: Cardiovascular;  Laterality: N/A; No date: ultrasound guided pericardiocentesis     Reproductive/Obstetrics negative OB ROS                              Anesthesia Physical Anesthesia Plan  ASA: 4  Anesthesia Plan: General   Post-op Pain Management:    Induction: Intravenous  PONV Risk Score and Plan: Propofol  infusion and TIVA  Airway Management Planned: Natural Airway and Nasal Cannula  Additional Equipment:   Intra-op Plan:   Post-operative Plan:   Informed Consent: I have reviewed the patients History and Physical, chart, labs and discussed the procedure including the risks, benefits and alternatives for the proposed anesthesia with the patient or authorized representative who has indicated his/her understanding and acceptance.     Dental Advisory  Given  Plan Discussed with: Anesthesiologist, CRNA and Surgeon  Anesthesia Plan Comments: (Patient consented for risks of anesthesia including but not limited to:  - adverse reactions to medications - risk of airway placement if required - damage to eyes, teeth, lips or other oral mucosa - nerve damage due to positioning  - sore throat or hoarseness - Damage to heart, brain, nerves, lungs, other parts of body or loss of life  Patient voiced understanding and assent.)        Anesthesia Quick Evaluation

## 2024-02-07 NOTE — Anesthesia Procedure Notes (Signed)
 Procedure Name: Intubation Date/Time: 02/07/2024 10:31 AM  Performed by: Jackye Spanner, CRNAPre-anesthesia Checklist: Patient identified, Patient being monitored, Timeout performed, Emergency Drugs available and Suction available Patient Re-evaluated:Patient Re-evaluated prior to induction Oxygen  Delivery Method: Circle system utilized Preoxygenation: Pre-oxygenation with 100% oxygen  Induction Type: IV induction and Rapid sequence Laryngoscope Size: 3 and McGrath Grade View: Grade I Tube type: Oral Tube size: 7.0 mm Number of attempts: 1 Airway Equipment and Method: Stylet Placement Confirmation: ETT inserted through vocal cords under direct vision, positive ETCO2 and breath sounds checked- equal and bilateral Secured at: 21 cm Tube secured with: Tape Dental Injury: Teeth and Oropharynx as per pre-operative assessment  Comments: Smooth atraumatic intubation. No complications noted.

## 2024-02-07 NOTE — Op Note (Signed)
 [facility]    OPERATIVE NOTE   PROCEDURE: 1.   Left brachiobasilic arteriovenous fistula cannulation under ultrasound guidance 2.   Left arm fistulagram including central venogram 3.   Percutaneous transluminal angioplasty of mid upper arm access site stenosis with 8 mm diameter by 6 cm length angioplasty balloon 4.   Percutaneous transluminal angioplasty of the junction to the axillary vein stenosis with 8 mm diameter Lutonix drug-coated angioplasty balloon and then 10 mm diameter by 4 cm length conventional angioplasty balloon  PRE-OPERATIVE DIAGNOSIS: 1. ESRD 2. Poorly functional left brachiobasilic AVF  POST-OPERATIVE DIAGNOSIS: same as above   SURGEON: Selinda Gu, MD  ANESTHESIA: general  ESTIMATED BLOOD LOSS: 3 cc  FINDING(S): This demonstrated stenosis in the midportion of the access in the mid upper arm of about 80%.  There was another stenosis at the junction to the axillary vein in the 80% range as well.  The remainder of the central venous circulation was widely patent.  SPECIMEN(S):  None  CONTRAST: 25 cc  FLUORO TIME: 1.6 minutes  Anesthesia: General  INDICATIONS: Vanessa GOSSER is a 63 y.o. female who presents with malfunctioning left brachiobasilic arteriovenous fistula.  This has already had a surgical jump graft revision.  The patient is scheduled for left arm fistulagram.  The patient is aware the risks include but are not limited to: bleeding, infection, thrombosis of the cannulated access, and possible anaphylactic reaction to the contrast.  The patient is aware of the risks of the procedure and elects to proceed forward.  DESCRIPTION: After full informed written consent was obtained, the patient was brought back to the angiography suite and placed supine upon the angiography table.  The patient was connected to monitoring equipment. Anesthesia provided sedation. The left arm was prepped and draped in the standard fashion for a  percutaneous access intervention.  Under ultrasound guidance, the perianastomotic portion of the left brachiobasilic arteriovenous fistula was cannulated with a micropuncture needle under direct ultrasound guidance where it was patent and a permanent image was performed.  The microwire was advanced into the fistula and the needle was exchanged for the a microsheath.  I then upsized to a 6 Fr Sheath and imaging was performed.  Hand injections were completed to image the access including the central venous system. This demonstrated stenosis in the midportion of the access in the mid upper arm of about 80%.  There was another stenosis at the junction to the axillary vein in the 80% range as well.  The remainder of the central venous circulation was widely patent.  Based on the images, this patient will need intervention to both these areas of stenosis. I then gave the patient 3000 units of intravenous heparin .  I then crossed the stenoses with a Supracore wire.  Based on the imaging, a 8 mm x 6 cm Lutonix drug-coated angioplasty balloon was selected.  The balloon was centered around the axillary vein junction stenosis and inflated to 12 ATM for 1 minute(s) but this was slightly undersized.  It was pulled back to the mid upper arm stenosis at the access sites and inflated to 10 atm for 1 minute.  In the mid upper arm stenosis at the access sites, there is only about a 15 to 20% residual stenosis.  I then upsized to a 10 mm diameter by 4 cm length balloon for the axillary vein junction stenosis and inflated this to 8 atm for 1 minute..  On completion imaging, a 10-15% residual stenosis  was present in the axillary junction stenosis.     Based on the completion imaging, no further intervention is necessary.  The wire and balloon were removed from the sheath.  A 4-0 Monocryl purse-string suture was sewn around the sheath.  The sheath was removed while tying down the suture.  A sterile bandage was applied to the  puncture site.  COMPLICATIONS: None  CONDITION: Stable   Selinda Gu  02/07/2024 11:00 AM   This note was created with Dragon Medical transcription system. Any errors in dictation are purely unintentional.

## 2024-02-07 NOTE — Transfer of Care (Signed)
 Immediate Anesthesia Transfer of Care Note  Patient: [name]  Procedure(s) Performed: A/V Fistulagram (Left)  Patient Location: PACU  Anesthesia Type:General  Level of Consciousness: awake and drowsy  Airway & Oxygen  Therapy: Patient Spontanous Breathing and Patient connected to face mask oxygen   Post-op Assessment: Report given to RN and Post -op Vital signs reviewed and stable  Post vital signs: Reviewed and stable  Last Vitals:  Vitals Value Taken Time  BP 155/70 02/07/24 11:21  Temp 35.9 1120  Pulse 91 02/07/24 11:24  Resp 21 02/07/24 11:25  SpO2 94 % 02/07/24 11:23  Vitals shown include unfiled device data. Pt. Transported to PACU in her stretcher with help of OR circulator , oxygen  and portable transport monitors.   Last Pain:  Vitals:   02/07/24 0845  TempSrc: Temporal  PainSc: 0-No pain         Complications: No notable events documented.

## 2024-02-08 DIAGNOSIS — Z23 Encounter for immunization: Secondary | ICD-10-CM | POA: Diagnosis not present

## 2024-02-08 DIAGNOSIS — N2581 Secondary hyperparathyroidism of renal origin: Secondary | ICD-10-CM | POA: Diagnosis not present

## 2024-02-08 DIAGNOSIS — Z992 Dependence on renal dialysis: Secondary | ICD-10-CM | POA: Diagnosis not present

## 2024-02-08 DIAGNOSIS — N186 End stage renal disease: Secondary | ICD-10-CM | POA: Diagnosis not present

## 2024-02-11 DIAGNOSIS — Z23 Encounter for immunization: Secondary | ICD-10-CM | POA: Diagnosis not present

## 2024-02-11 DIAGNOSIS — N186 End stage renal disease: Secondary | ICD-10-CM | POA: Diagnosis not present

## 2024-02-11 DIAGNOSIS — N2581 Secondary hyperparathyroidism of renal origin: Secondary | ICD-10-CM | POA: Diagnosis not present

## 2024-02-11 DIAGNOSIS — Z992 Dependence on renal dialysis: Secondary | ICD-10-CM | POA: Diagnosis not present

## 2024-02-13 DIAGNOSIS — Z992 Dependence on renal dialysis: Secondary | ICD-10-CM | POA: Diagnosis not present

## 2024-02-13 DIAGNOSIS — N186 End stage renal disease: Secondary | ICD-10-CM | POA: Diagnosis not present

## 2024-02-14 DIAGNOSIS — N2581 Secondary hyperparathyroidism of renal origin: Secondary | ICD-10-CM | POA: Diagnosis not present

## 2024-02-14 DIAGNOSIS — Z992 Dependence on renal dialysis: Secondary | ICD-10-CM | POA: Diagnosis not present

## 2024-02-14 DIAGNOSIS — E8779 Other fluid overload: Secondary | ICD-10-CM | POA: Diagnosis not present

## 2024-02-14 DIAGNOSIS — N186 End stage renal disease: Secondary | ICD-10-CM | POA: Diagnosis not present

## 2024-02-16 ENCOUNTER — Encounter: Payer: Self-pay | Admitting: Cardiology

## 2024-02-16 ENCOUNTER — Ambulatory Visit (INDEPENDENT_AMBULATORY_CARE_PROVIDER_SITE_OTHER): Admitting: Cardiology

## 2024-02-16 VITALS — BP 110/70 | Ht 66.0 in | Wt 158.0 lb

## 2024-02-16 DIAGNOSIS — Z0130 Encounter for examination of blood pressure without abnormal findings: Secondary | ICD-10-CM

## 2024-02-16 DIAGNOSIS — M79605 Pain in left leg: Secondary | ICD-10-CM | POA: Diagnosis not present

## 2024-02-16 NOTE — Progress Notes (Signed)
 Established Patient Office Visit  Subjective:  Patient ID: [name], female    DOB: August 03, 1960  Age: 63 y.o. MRN: [id number]  Chief Complaint  Patient presents with   Leg Pain    Patient in office for an acute visit. Patient reports she was prescribed Levaquin 5 days ago took two doses 48 hours apart. Patient has a known intolerance to Levaquin. Patient reports developing severe leg pain, was dragging her left leg to walk. Patient took last dose on Monday. Patient reports feeling better today, no longer having leg pain.  No changes at this time.     No other concerns at this time.   Past Medical History:  Diagnosis Date   Acute lacunar infarction (HCC) 03/03/2014   a.) MRI brain 03/03/2014: two acute punctate lacunar infarcts anteriorly in the BILATERAL centrum semiovale   Adult behavior problems    Anemia of chronic renal failure    Aortic atherosclerosis (HCC)    Atrial fibrillation and flutter (HCC)    a.) CHA2DS2VASc = 6 (sex, HFrEF, HTN, CVA x 2, prior MI/vascular disease) as of 02/04/2024; b.) rate/rhythm maintained on oral metoprolol  succinate; chronically anticoagulated with apixaban    Bipolar disorder (HCC)    Cardiomegaly    Cerebral microvascular disease    Chicken pox    Cholelithiasis    Chronic respiratory failure with hypoxia (HCC) 12/19/2021   COPD (chronic obstructive pulmonary disease) (HCC)    Coronary artery disease    DDD (degenerative disc disease), cervical    DDD (degenerative disc disease), thoracolumbar    Diverticulosis    Dyspnea    ESRD (end stage renal disease) on dialysis (M,W,F)    GERD (gastroesophageal reflux disease)    HCV (hepatitis C virus)    HFrEF (heart failure with reduced ejection fraction) (HCC)    History of delirium 04/12/2014   History of substance abuse (HCC)    a.) tobacco + cocaine   Hyperkalemia 11/13/2014   Hyperlipidemia    Hypertension    Hypotension 02/24/2014   Hypothyroidism    Ischemic cardiomyopathy     Nose colonized with MRSA 02/01/2024   a.) presurgical PCR (+) on 02/01/2024 prior to A/V FISTULAGRAM   NSTEMI (non-ST elevated myocardial infarction) (HCC) 10/08/2013   NSTEMI (non-ST elevated myocardial infarction) (HCC) 01/23/2002   a.) LHC 01/24/2002 --> multi-vessel CAD with IRA being 90% OM2 --> delayed PCI until 01/25/2002 --> 3.0 x 23 mm BX Velocity stent   Obesity    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    On apixaban  therapy    Peripheral vascular disease (HCC)    Postoperative anemia due to acute blood loss 02/27/2014   Postoperative cerebrovascular infarction following cardiac surgery (HCC) 02/21/2014   a.) developed RIGHT upper/lower extremity weakness following cardiac revascularization --> neuro consult and head CT --> Hypodensity noted in the area of the LEFT motor cortex consistent with a component of acute ischemia   Pulmonary hypertension (HCC)    RBBB (right bundle branch block)    Renal osteodystrophy    S/P CABG x 3 02/20/2014   a.) LIMA-LAD, SVG-OM1, SVG-PDA   Stroke (HCC) 05/23/2010   a.) brain MRI 05/23/2010: BILATERAL acute infarcts and old prior infarcts; RIGHT frontal lobe extending into the white matter. Small focus of restricted diffusion in the cortex of the medial LEFT frontoparietal lobe. Periventricular T2 and multiple foci of T2 hyperintensity in the subcortical white matter and increased FLAIR signal in the cortex of the LEFT frontal lobe.  Syncope and collapse 07/25/2020    Past Surgical History:  Procedure Laterality Date   A/V FISTULAGRAM Left 03/30/2019   Procedure: A/V FISTULAGRAM;  Surgeon: Marea Selinda RAMAN, MD;  Location: [facility];  Service: Cardiovascular;  Laterality: Left;   A/V FISTULAGRAM Left 10/06/2019   Procedure: A/V FISTULAGRAM;  Surgeon: Marea Selinda RAMAN, MD;  Location: [facility];  Service: Cardiovascular;  Laterality: Left;   A/V FISTULAGRAM Left 05/19/2021   Procedure: A/V FISTULAGRAM;  Surgeon: Marea Selinda RAMAN, MD;  Location: [facility];  Service: Cardiovascular;  Laterality: Left;   A/V FISTULAGRAM Left 01/29/2022   Procedure: A/V Fistulagram;  Surgeon: Marea Selinda RAMAN, MD;  Location: [facility];  Service: Cardiovascular;  Laterality: Left;   A/V FISTULAGRAM Left 04/30/2022   Procedure: A/V Fistulagram;  Surgeon: Marea Selinda RAMAN, MD;  Location: [facility];  Service: Cardiovascular;  Laterality: Left;   A/V FISTULAGRAM Left 08/20/2022   Procedure: A/V Fistulagram;  Surgeon: Marea Selinda RAMAN, MD;  Location: [facility];  Service: Cardiovascular;  Laterality: Left;   A/V FISTULAGRAM N/A 12/03/2022   Procedure: A/V Fistulagram;  Surgeon: Marea Selinda RAMAN, MD;  Location: [facility];  Service: Cardiovascular;  Laterality: N/A;   A/V FISTULAGRAM Left 02/11/2023   Procedure: A/V Fistulagram;  Surgeon: Marea Selinda RAMAN, MD;  Location: [facility];  Service: Cardiovascular;  Laterality: Left;   A/V FISTULAGRAM Left 02/07/2024   Procedure: A/V Fistulagram;  Surgeon: Marea Selinda RAMAN, MD;  Location: [facility];  Service: Cardiovascular;  Laterality: Left;   COLONOSCOPY     COLONOSCOPY WITH PROPOFOL  N/A 04/22/2021   Procedure: COLONOSCOPY WITH PROPOFOL ;  Surgeon: Maryruth Ole DASEN, MD;  Location: [facility];  Service: Endoscopy;  Laterality: N/A;   CORONARY ANGIOPLASTY WITH STENT PLACEMENT Left 01/25/2002   Procedure: CORONARY ANGIOPLASTY WITH STENT PLACEMENT; Location: [facility]; Surgeon: Margie Lovelace, MD   CORONARY ARTERY BYPASS GRAFT N/A 02/20/2014   Procedure: CORONARY ARTERY BYPASS GRAFT; Location: [facility]; Surgeon: Reyes Fruits, MD   DIALYSIS FISTULA CREATION     DIALYSIS/PERMA CATHETER INSERTION N/A 04/08/2023   Procedure: DIALYSIS/PERMA CATHETER INSERTION;  Surgeon: Marea Selinda RAMAN, MD;  Location: [facility];  Service: Cardiovascular;  Laterality: N/A;   DIALYSIS/PERMA CATHETER REMOVAL N/A 07/06/2023   Procedure: DIALYSIS/PERMA CATHETER  REMOVAL;  Surgeon: [name] Cordella MATSU, MD;  Location: [facility];  Service: Cardiovascular;  Laterality: N/A;   ESOPHAGOGASTRODUODENOSCOPY     ESOPHAGOGASTRODUODENOSCOPY N/A 04/22/2021   Procedure: ESOPHAGOGASTRODUODENOSCOPY (EGD);  Surgeon: Maryruth Ole DASEN, MD;  Location: [facility];  Service: Endoscopy;  Laterality: N/A;   FLEXIBLE SIGMOIDOSCOPY N/A 02/23/2019   Procedure: FLEXIBLE SIGMOIDOSCOPY;  Surgeon: Gaylyn Gladis PENNER, MD;  Location: [facility];  Service: Endoscopy;  Laterality: N/A;   IABP INSERTION  02/20/2014   Procedure: INTRA-AORTIC BALLOON PUMP INSERTION; Location: [facility]; Surgeon: Reyes Fruits, MD   LEFT HEART CATH AND CORONARY ANGIOGRAPHY Left 01/24/2002   Procedure: LEFT HEART CATH AND CORONARY ANGIOGRAPHY; Location: [facility]; Surgeon: Margie Lovelace, MD   LEFT HEART CATH AND CORONARY ANGIOGRAPHY Left 05/25/2012   Procedure: LEFT HEART CATH AND CORONARY ANGIOGRAPHY; Location: [facility]; Surgeon: Margie Lovelace, MD   LEFT HEART CATH AND CORONARY ANGIOGRAPHY Left 10/09/2013   Procedure: LEFT HEART CATH AND CORONARY ANGIOGRAPHY; Location: [facility]; Surgeon: Vinie Jude, MD   LEFT HEART CATH AND CORS/GRAFTS ANGIOGRAPHY N/A 08/08/2018   Procedure: LEFT HEART CATH AND CORS/GRAFTS ANGIOGRAPHY;  Surgeon: Fernand Denyse LABOR,  MD;  Location: [facility];  Service: Cardiovascular;  Laterality: N/A;   LEFT HEART CATH AND CORS/GRAFTS ANGIOGRAPHY N/A 01/30/2019   Procedure: LEFT HEART CATH AND CORS/GRAFTS ANGIOGRAPHY;  Surgeon: Fernand Denyse LABOR, MD;  Location: [facility];  Service: Cardiovascular;  Laterality: N/A;   ultrasound guided pericardiocentesis      Social History   Socioeconomic History   Marital status: Single    Spouse name: Not on file   Number of children: Not on file   Years of education: Not on file   Highest education level: Not on file  Occupational History   Not on file  Tobacco Use   Smoking status: Every Day    Current packs/day: 0.25     Average packs/day: 0.3 packs/day for 20.0 years (5.0 ttl pk-yrs)    Types: Cigarettes   Smokeless tobacco: Never   Tobacco comments:    smokes 1 - 1.5 cigarettes a day  Vaping Use   Vaping status: Never Used  Substance and Sexual Activity   Alcohol use: No    Comment: social   Drug use: No   Sexual activity: Not Currently    Birth control/protection: Post-menopausal  Other Topics Concern   Not on file  Social History Narrative   Lives alone   Social Drivers of Health   Financial Resource Strain: Not on file  Food Insecurity: No Food Insecurity (05/18/2023)   Hunger Vital Sign    Worried About Running Out of Food in the Last Year: Never true    Ran Out of Food in the Last Year: Never true  Transportation Needs: No Transportation Needs (05/18/2023)   PRAPARE - Administrator, Civil Service (Medical): No    Lack of Transportation (Non-Medical): No  Physical Activity: Not on file  Stress: Not on file  Social Connections: Not on file  Intimate Partner Violence: Not At Risk (05/18/2023)   Humiliation, Afraid, Rape, and Kick questionnaire    Fear of Current or Ex-Partner: No    Emotionally Abused: No    Physically Abused: No    Sexually Abused: No    Family History  Problem Relation Age of Onset   Hypertension Mother    Ovarian cancer Mother    Diabetes type II Mother    Hypertension Father    Kidney disease Father    Hypertension Sister    Diabetes type II Maternal Grandmother    Breast cancer Maternal Grandmother    Breast cancer Maternal Aunt    Hypertension Other    Cancer Other    Renal Disease Other     Allergies  Allergen Reactions   Morphine  And Codeine Hives   Levaquin [Levofloxacin] Itching and Other (See Comments)    Severe itching; prickly sensation; severe left leg pain   Enalapril  Other (See Comments)    hallucinations   Latex Rash   Tape Rash and Other (See Comments)    Outpatient Medications Prior to Visit  Medication Sig    albuterol  (VENTOLIN  HFA) 108 (90 Base) MCG/ACT inhaler Inhale 1-2 puffs into the lungs every 6 (six) hours as needed for wheezing or shortness of breath.   apixaban  (ELIQUIS ) 5 MG TABS tablet Take 5 mg by mouth 2 (two) times daily.   hydrOXYzine  (ATARAX /VISTARIL ) 50 MG tablet Take 50 mg by mouth at bedtime.   levothyroxine  (SYNTHROID ) 75 MCG tablet Take 75 mcg by mouth daily before breakfast.    lidocaine -prilocaine  (EMLA ) cream Apply 1 Application topically once.  metoprolol  succinate (TOPROL -XL) 25 MG 24 hr tablet Take 1 tablet by mouth daily.   midodrine  (PROAMATINE ) 10 MG tablet Take 1 tablet (10 mg total) by mouth 3 (three) times daily. Monday, Wednesday, Friday with hemodialysis.   Take 1 tablet once daily on non-dialysis days.   mupirocin  ointment (BACTROBAN ) 2 % Apply small amount to the inside of both nostrils TWICE a day for the next 5 days.   oxyCODONE -acetaminophen  (PERCOCET/ROXICET) 5-325 MG tablet Take 1 tablet by mouth every 6 (six) hours as needed for severe pain (pain score 7-10).   pantoprazole  (PROTONIX ) 40 MG tablet Take 40 mg by mouth daily.   ranolazine  (RANEXA ) 1000 MG SR tablet Take 1,000 mg by mouth 2 (two) times daily.   rosuvastatin  (CRESTOR ) 20 MG tablet Take 20 mg by mouth at bedtime.   sevelamer  carbonate (RENVELA ) 800 MG tablet Take 3,200 mg by mouth 3 (three) times daily with meals.   triamcinolone  cream (KENALOG ) 0.1 % Apply 1 Application topically 2 (two) times daily.   Facility-Administered Medications Prior to Visit  Medication Dose Route Frequency Provider   heparin  injection    PRN Marea Selinda RAMAN, MD    ROS     Objective:   BP 110/70   Ht 5' 6 (1.676 m)   Wt 158 lb (71.7 kg)   BMI 25.50 kg/m   Vitals:   02/16/24 1029  BP: 110/70  Height: 5' 6 (1.676 m)  Weight: 158 lb (71.7 kg)  BMI (Calculated): 25.51    Physical Exam   No results found for any visits on 02/16/24.  Recent Results (from the past 2160 hours)  Type and screen [facility]     Status: None   Collection Time: 02/01/24 10:26 AM  Result Value Ref Range   ABO/RH(D) O POS    Antibody Screen POS    Sample Expiration 02/15/2024,2359    Extend sample reason NO TRANSFUSIONS OR PREGNANCY IN THE PAST 3 MONTHS    Antibody Identification NON SPECIFIC ANTIBODY REACTIVITY    DAT, IgG NEG    DAT, complement      NEG Performed at [facility], [address] [place], [place] [address]   CBC     Status: Abnormal   Collection Time: 02/01/24 10:26 AM  Result Value Ref Range   WBC 4.9 4.0 - 10.5 K/uL   RBC 4.19 3.87 - 5.11 MIL/uL   Hemoglobin 13.5 12.0 - 15.0 g/dL   HCT 58.7 63.9 - 53.9 %   MCV 98.3 80.0 - 100.0 fL   MCH 32.2 26.0 - 34.0 pg   MCHC 32.8 30.0 - 36.0 g/dL   RDW 84.3 (H) 88.4 - 84.4 %   Platelets 116 (L) 150 - 400 K/uL   nRBC 0.0 0.0 - 0.2 %    Comment: Performed at [facility], [address], [place], [place] [address]  Basic metabolic panel     Status: Abnormal   Collection Time: 02/01/24 10:26 AM  Result Value Ref Range   Sodium 138 135 - 145 mmol/L   Potassium 4.3 3.5 - 5.1 mmol/L   Chloride 97 (L) 98 - 111 mmol/L   CO2 24 22 - 32 mmol/L   Glucose, Bld 72 70 - 99 mg/dL    Comment: Glucose reference range applies only to samples taken after fasting for at least 8 hours.   BUN 46 (H) 8 - 23 mg/dL   Creatinine, Ser 2.97 (H) 0.44 - 1.00 mg/dL  Calcium  8.5 (L) 8.9 - 10.3 mg/dL   GFR, Estimated 6 (L) >60 mL/min    Comment: (NOTE) Calculated using the CKD-EPI Creatinine Equation (2021)    Anion gap 17 (H) 5 - 15    Comment: Performed at [facility], [address] [place], [place] [address]  Surgical pcr screen     Status: Abnormal   Collection Time: 02/01/24 10:35 AM   Specimen: Nasal Mucosa; Nasal Swab  Result Value Ref Range   MRSA, PCR POSITIVE (A) NEGATIVE    Comment: RESULT CALLED TO, READ BACK BY AND VERIFIED WITH: BRYAN GRAY AT 1416 ON 02/01/24 BY SS    Staphylococcus aureus  POSITIVE (A) NEGATIVE    Comment: (NOTE) The Xpert SA Assay (FDA approved for NASAL specimens in patients 57 years of age and older), is one component of a comprehensive surveillance program. It is not intended to diagnose infection nor to guide or monitor treatment. Performed at [facility], [address] [place], [place] [address]   Potassium [facility] vascular lab only)     Status: Abnormal   Collection Time: 02/07/24  8:46 AM  Result Value Ref Range   Potassium [facility] vascular lab) 5.7 (H) 3.5 - 5.1 mmol/L    Comment: Performed at [facility], [address] [place], [place] [address]  Potassium     Status: Abnormal   Collection Time: 02/07/24  9:07 AM  Result Value Ref Range   Potassium 5.4 (H) 3.5 - 5.1 mmol/L    Comment: Performed at [facility], [address] [place], [place] [address]      Assessment & Plan:  Continue same medications.  Problem List Items Addressed This Visit       Other   Pain of left lower extremity - Primary    Return in about 3 weeks (around 03/08/2024), or as schedule with Alan.   Total time spent: 25 minutes  [employer], NP  02/16/2024   This document may have been prepared by Dragon Voice Recognition software and as such may include unintentional dictation errors.

## 2024-02-17 ENCOUNTER — Encounter: Payer: Self-pay | Admitting: Cardiovascular Disease

## 2024-02-17 ENCOUNTER — Ambulatory Visit (INDEPENDENT_AMBULATORY_CARE_PROVIDER_SITE_OTHER): Admitting: Cardiovascular Disease

## 2024-02-17 VITALS — BP 80/40 | HR 90 | Ht 66.0 in | Wt 160.0 lb

## 2024-02-17 DIAGNOSIS — I480 Paroxysmal atrial fibrillation: Secondary | ICD-10-CM | POA: Diagnosis not present

## 2024-02-17 DIAGNOSIS — R0602 Shortness of breath: Secondary | ICD-10-CM

## 2024-02-17 DIAGNOSIS — I9589 Other hypotension: Secondary | ICD-10-CM | POA: Diagnosis not present

## 2024-02-17 DIAGNOSIS — I5020 Unspecified systolic (congestive) heart failure: Secondary | ICD-10-CM

## 2024-02-17 DIAGNOSIS — Z951 Presence of aortocoronary bypass graft: Secondary | ICD-10-CM | POA: Diagnosis not present

## 2024-02-17 MED ORDER — MIDODRINE HCL 10 MG PO TABS: 10.0000 mg | ORAL_TABLET | Freq: Three times a day (TID) | ORAL | 0 refills | Status: DC

## 2024-02-17 NOTE — Progress Notes (Signed)
 Cardiology Office Note   Date:  02/17/2024   ID:  [name], DOB [birth year], MRN [id number]  PCP:  Orlean Alan HERO, FNP  Cardiologist:  Denyse Bathe, MD      History of Present Illness: [name] is a 63 y.o. female who presents for  Chief Complaint  Patient presents with   Follow-up    2 month follow up     Feels dizzy, and SOB. BP is low.      Past Medical History:  Diagnosis Date   Acute lacunar infarction (HCC) 03/03/2014   a.) MRI brain 03/03/2014: two acute punctate lacunar infarcts anteriorly in the BILATERAL centrum semiovale   Adult behavior problems    Anemia of chronic renal failure    Aortic atherosclerosis (HCC)    Atrial fibrillation and flutter (HCC)    a.) CHA2DS2VASc = 6 (sex, HFrEF, HTN, CVA x 2, prior MI/vascular disease) as of 02/04/2024; b.) rate/rhythm maintained on oral metoprolol  succinate; chronically anticoagulated with apixaban    Bipolar disorder (HCC)    Cardiomegaly    Cerebral microvascular disease    Chicken pox    Cholelithiasis    Chronic respiratory failure with hypoxia (HCC) 12/19/2021   COPD (chronic obstructive pulmonary disease) (HCC)    Coronary artery disease    DDD (degenerative disc disease), cervical    DDD (degenerative disc disease), thoracolumbar    Diverticulosis    Dyspnea    ESRD (end stage renal disease) on dialysis (M,W,F)    GERD (gastroesophageal reflux disease)    HCV (hepatitis C virus)    HFrEF (heart failure with reduced ejection fraction) (HCC)    History of delirium 04/12/2014   History of substance abuse (HCC)    a.) tobacco + cocaine   Hyperkalemia 11/13/2014   Hyperlipidemia    Hypertension    Hypotension 02/24/2014   Hypothyroidism    Ischemic cardiomyopathy    Nose colonized with MRSA 02/01/2024   a.) presurgical PCR (+) on 02/01/2024 prior to A/V FISTULAGRAM   NSTEMI (non-ST elevated myocardial infarction) (HCC) 10/08/2013   NSTEMI (non-ST elevated myocardial infarction)  (HCC) 01/23/2002   a.) LHC 01/24/2002 --> multi-vessel CAD with IRA being 90% OM2 --> delayed PCI until 01/25/2002 --> 3.0 x 23 mm BX Velocity stent   Obesity    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    On apixaban  therapy    Peripheral vascular disease (HCC)    Postoperative anemia due to acute blood loss 02/27/2014   Postoperative cerebrovascular infarction following cardiac surgery (HCC) 02/21/2014   a.) developed RIGHT upper/lower extremity weakness following cardiac revascularization --> neuro consult and head CT --> Hypodensity noted in the area of the LEFT motor cortex consistent with a component of acute ischemia   Pulmonary hypertension (HCC)    RBBB (right bundle branch block)    Renal osteodystrophy    S/P CABG x 3 02/20/2014   a.) LIMA-LAD, SVG-OM1, SVG-PDA   Stroke (HCC) 05/23/2010   a.) brain MRI 05/23/2010: BILATERAL acute infarcts and old prior infarcts; RIGHT frontal lobe extending into the white matter. Small focus of restricted diffusion in the cortex of the medial LEFT frontoparietal lobe. Periventricular T2 and multiple foci of T2 hyperintensity in the subcortical white matter and increased FLAIR signal in the cortex of the LEFT frontal lobe.   Syncope and collapse 07/25/2020     Past Surgical History:  Procedure Laterality Date   A/V FISTULAGRAM Left 03/30/2019   Procedure: A/V FISTULAGRAM;  Surgeon: Marea Selinda RAMAN, MD;  Location: [facility];  Service: Cardiovascular;  Laterality: Left;   A/V FISTULAGRAM Left 10/06/2019   Procedure: A/V FISTULAGRAM;  Surgeon: Marea Selinda RAMAN, MD;  Location: [facility];  Service: Cardiovascular;  Laterality: Left;   A/V FISTULAGRAM Left 05/19/2021   Procedure: A/V FISTULAGRAM;  Surgeon: Marea Selinda RAMAN, MD;  Location: [facility];  Service: Cardiovascular;  Laterality: Left;   A/V FISTULAGRAM Left 01/29/2022   Procedure: A/V Fistulagram;  Surgeon: Marea Selinda RAMAN, MD;  Location: [facility];   Service: Cardiovascular;  Laterality: Left;   A/V FISTULAGRAM Left 04/30/2022   Procedure: A/V Fistulagram;  Surgeon: Marea Selinda RAMAN, MD;  Location: [facility];  Service: Cardiovascular;  Laterality: Left;   A/V FISTULAGRAM Left 08/20/2022   Procedure: A/V Fistulagram;  Surgeon: Marea Selinda RAMAN, MD;  Location: [facility];  Service: Cardiovascular;  Laterality: Left;   A/V FISTULAGRAM N/A 12/03/2022   Procedure: A/V Fistulagram;  Surgeon: Marea Selinda RAMAN, MD;  Location: [facility];  Service: Cardiovascular;  Laterality: N/A;   A/V FISTULAGRAM Left 02/11/2023   Procedure: A/V Fistulagram;  Surgeon: Marea Selinda RAMAN, MD;  Location: [facility];  Service: Cardiovascular;  Laterality: Left;   A/V FISTULAGRAM Left 02/07/2024   Procedure: A/V Fistulagram;  Surgeon: Marea Selinda RAMAN, MD;  Location: [facility];  Service: Cardiovascular;  Laterality: Left;   COLONOSCOPY     COLONOSCOPY WITH PROPOFOL  N/A 04/22/2021   Procedure: COLONOSCOPY WITH PROPOFOL ;  Surgeon: Maryruth Ole DASEN, MD;  Location: [facility];  Service: Endoscopy;  Laterality: N/A;   CORONARY ANGIOPLASTY WITH STENT PLACEMENT Left 01/25/2002   Procedure: CORONARY ANGIOPLASTY WITH STENT PLACEMENT; Location: [facility]; Surgeon: Margie Lovelace, MD   CORONARY ARTERY BYPASS GRAFT N/A 02/20/2014   Procedure: CORONARY ARTERY BYPASS GRAFT; Location: [facility]; Surgeon: Reyes Fruits, MD   DIALYSIS FISTULA CREATION     DIALYSIS/PERMA CATHETER INSERTION N/A 04/08/2023   Procedure: DIALYSIS/PERMA CATHETER INSERTION;  Surgeon: Marea Selinda RAMAN, MD;  Location: [facility];  Service: Cardiovascular;  Laterality: N/A;   DIALYSIS/PERMA CATHETER REMOVAL N/A 07/06/2023   Procedure: DIALYSIS/PERMA CATHETER REMOVAL;  Surgeon: [name] Cordella MATSU, MD;  Location: [facility];  Service: Cardiovascular;  Laterality: N/A;   ESOPHAGOGASTRODUODENOSCOPY     ESOPHAGOGASTRODUODENOSCOPY N/A 04/22/2021   Procedure:  ESOPHAGOGASTRODUODENOSCOPY (EGD);  Surgeon: Maryruth Ole DASEN, MD;  Location: [facility];  Service: Endoscopy;  Laterality: N/A;   FLEXIBLE SIGMOIDOSCOPY N/A 02/23/2019   Procedure: FLEXIBLE SIGMOIDOSCOPY;  Surgeon: Gaylyn Gladis PENNER, MD;  Location[facility];  Service: Endoscopy;  Laterality: N/A;   IABP INSERTION  02/20/2014   Procedure: INTRA-AORTIC BALLOON PUMP INSERTION; Location: [facility]; Surgeon: Reyes Fruits, MD   LEFT HEART CATH AND CORONARY ANGIOGRAPHY Left 01/24/2002   Procedure: LEFT HEART CATH AND CORONARY ANGIOGRAPHY; Location: [facility]; Surgeon: Margie Lovelace, MD   LEFT HEART CATH AND CORONARY ANGIOGRAPHY Left 05/25/2012   Procedure: LEFT HEART CATH AND CORONARY ANGIOGRAPHY; Location: [facility]; Surgeon: Margie Lovelace, MD   LEFT HEART CATH AND CORONARY ANGIOGRAPHY Left 10/09/2013   Procedure: LEFT HEART CATH AND CORONARY ANGIOGRAPHY; Location: [facility]; Surgeon: Vinie Jude, MD   LEFT HEART CATH AND CORS/GRAFTS ANGIOGRAPHY N/A 08/08/2018   Procedure: LEFT HEART CATH AND CORS/GRAFTS ANGIOGRAPHY;  Surgeon: Fernand Denyse LABOR, MD;  Location: [facility];  Service: Cardiovascular;  Laterality: N/A;   LEFT HEART CATH AND CORS/GRAFTS ANGIOGRAPHY N/A 01/30/2019   Procedure:  LEFT HEART CATH AND CORS/GRAFTS ANGIOGRAPHY;  Surgeon: Fernand Denyse LABOR, MD;  Location: [facility];  Service: Cardiovascular;  Laterality: N/A;   ultrasound guided pericardiocentesis       Current Outpatient Medications  Medication Sig Dispense Refill   albuterol  (VENTOLIN  HFA) 108 (90 Base) MCG/ACT inhaler Inhale 1-2 puffs into the lungs every 6 (six) hours as needed for wheezing or shortness of breath. 20.1 g 3   apixaban  (ELIQUIS ) 5 MG TABS tablet Take 5 mg by mouth 2 (two) times daily.     hydrOXYzine  (ATARAX /VISTARIL ) 50 MG tablet Take 50 mg by mouth at bedtime.     levothyroxine  (SYNTHROID ) 75 MCG tablet Take 75 mcg by mouth daily before breakfast.  (Patient not taking: Reported on 02/17/2024)      lidocaine -prilocaine  (EMLA ) cream Apply 1 Application topically once.     metoprolol  succinate (TOPROL -XL) 25 MG 24 hr tablet Take 1 tablet by mouth daily.     midodrine  (PROAMATINE ) 10 MG tablet Take 1 tablet (10 mg total) by mouth 3 (three) times daily. 90 tablet 0   mupirocin  ointment (BACTROBAN ) 2 % Apply small amount to the inside of both nostrils TWICE a day for the next 5 days. 15 g 0   oxyCODONE -acetaminophen  (PERCOCET/ROXICET) 5-325 MG tablet Take 1 tablet by mouth every 6 (six) hours as needed for severe pain (pain score 7-10). 20 tablet 0   pantoprazole  (PROTONIX ) 40 MG tablet Take 40 mg by mouth daily.     ranolazine  (RANEXA ) 1000 MG SR tablet Take 1,000 mg by mouth 2 (two) times daily.     rosuvastatin  (CRESTOR ) 20 MG tablet Take 20 mg by mouth at bedtime.     sevelamer  carbonate (RENVELA ) 800 MG tablet Take 3,200 mg by mouth 3 (three) times daily with meals.     triamcinolone  cream (KENALOG ) 0.1 % Apply 1 Application topically 2 (two) times daily. 453.6 g 2   No current facility-administered medications for this visit.   Facility-Administered Medications Ordered in Other Visits  Medication Dose Route Frequency Provider Last Rate Last Admin   heparin  injection    PRN Dew, Jason S, MD   10,000 Units at 04/08/23 1412    Allergies:   Morphine  and codeine, Levaquin [levofloxacin], Enalapril , Levothyroxine , Latex, and Tape    Social History:   reports that she has been smoking cigarettes. She has a 5 pack-year smoking history. She has never used smokeless tobacco. She reports that she does not drink alcohol and does not use drugs.   Family History:  family history includes Breast cancer in her maternal aunt and maternal grandmother; Cancer in an other family member; Diabetes type II in her maternal grandmother and mother; Hypertension in her father, mother, sister, and another family member; Kidney disease in her father; Ovarian cancer in her mother; Renal Disease in an other family  member.    ROS:     Review of Systems  Constitutional: Negative.   HENT: Negative.    Eyes: Negative.   Respiratory: Negative.    Gastrointestinal: Negative.   Genitourinary: Negative.   Musculoskeletal: Negative.   Skin: Negative.   Neurological: Negative.   Endo/Heme/Allergies: Negative.   Psychiatric/Behavioral: Negative.    All other systems reviewed and are negative.     All other systems are reviewed and negative.    PHYSICAL EXAM: VS:  BP (!) 80/40   Pulse 90   Ht 5' 6 (1.676 m)   Wt 160 lb (72.6 kg)   BMI 25.82  kg/m  , BMI Body mass index is 25.82 kg/m. Last weight:  Wt Readings from Last 3 Encounters:  02/17/24 160 lb (72.6 kg)  02/16/24 158 lb (71.7 kg)  02/07/24 161 lb (73 kg)     Physical Exam Constitutional:      Appearance: Normal appearance.  Cardiovascular:     Rate and Rhythm: Normal rate and regular rhythm.     Heart sounds: Normal heart sounds.  Pulmonary:     Effort: Pulmonary effort is normal.     Breath sounds: Normal breath sounds.  Musculoskeletal:     Right lower leg: No edema.     Left lower leg: No edema.  Neurological:     Mental Status: She is alert.       EKG:   Recent Labs: 05/17/2023: ALT 16 02/01/2024: BUN 46; Creatinine, Ser 7.02; Hemoglobin 13.5; Platelets 116; Sodium 138 02/07/2024: Potassium 5.4    Lipid Panel    Component Value Date/Time   CHOL 140 01/18/2019 1358   CHOL 79 06/14/2014 0404   TRIG 148 01/18/2019 1358   TRIG 85 06/14/2014 0404   HDL 43 01/18/2019 1358   HDL 35 (L) 06/14/2014 0404   CHOLHDL 3.3 01/18/2019 1358   VLDL 30 01/18/2019 1358   VLDL 17 06/14/2014 0404   LDLCALC 67 01/18/2019 1358   LDLCALC 27 06/14/2014 0404      Other studies Reviewed: Additional studies/ records that were reviewed today include:  Review of the above records demonstrates:       No data to display            ASSESSMENT AND PLAN:    ICD-10-CM   1. Chronic hypotension  I95.89 midodrine   (PROAMATINE ) 10 MG tablet    PCV ECHOCARDIOGRAM COMPLETE   feeling dizzy, add midrodine 10 tid every day    2. Paroxysmal atrial fibrillation (HCC)  I48.0 midodrine  (PROAMATINE ) 10 MG tablet    PCV ECHOCARDIOGRAM COMPLETE    3. HFrEF (heart failure with reduced ejection fraction) (HCC)  I50.20 midodrine  (PROAMATINE ) 10 MG tablet    PCV ECHOCARDIOGRAM COMPLETE    4. SOB (shortness of breath)  R06.02 midodrine  (PROAMATINE ) 10 MG tablet    PCV ECHOCARDIOGRAM COMPLETE    5. S/P CABG x 3  Z95.1 midodrine  (PROAMATINE ) 10 MG tablet    PCV ECHOCARDIOGRAM COMPLETE       Problem List Items Addressed This Visit       Cardiovascular and Mediastinum   Chronic hypotension - Primary (Chronic)   Relevant Medications   midodrine  (PROAMATINE ) 10 MG tablet   Other Relevant Orders   PCV ECHOCARDIOGRAM COMPLETE   HFrEF (heart failure with reduced ejection fraction) (HCC) (Chronic)   Relevant Medications   midodrine  (PROAMATINE ) 10 MG tablet   Other Relevant Orders   PCV ECHOCARDIOGRAM COMPLETE   Paroxysmal atrial fibrillation (HCC)   Relevant Medications   midodrine  (PROAMATINE ) 10 MG tablet   Other Relevant Orders   PCV ECHOCARDIOGRAM COMPLETE     Other   S/P CABG x 3   Relevant Medications   midodrine  (PROAMATINE ) 10 MG tablet   Other Relevant Orders   PCV ECHOCARDIOGRAM COMPLETE   Other Visit Diagnoses       SOB (shortness of breath)       Relevant Medications   midodrine  (PROAMATINE ) 10 MG tablet   Other Relevant Orders   PCV ECHOCARDIOGRAM COMPLETE          Disposition:   Return in about 4 weeks (around 03/16/2024)  for echo and f/u.    Total time spent: 30 minutes  Signed,  Denyse Bathe, MD  02/17/2024 9:45 AM    [facility]

## 2024-02-18 DIAGNOSIS — N186 End stage renal disease: Secondary | ICD-10-CM | POA: Diagnosis not present

## 2024-02-21 ENCOUNTER — Telehealth (INDEPENDENT_AMBULATORY_CARE_PROVIDER_SITE_OTHER): Payer: Self-pay | Admitting: Vascular Surgery

## 2024-02-21 NOTE — Telephone Encounter (Signed)
 Patient LVM on [facility] line stating she needed to make appt at [facility]. Already scheduled for post op on 03/21/24 w/HDA. Pt states she needs more than a follow up appt Arm still not doing right. Please advise if we should be moving post op appt sooner or scheduling something else for pt.

## 2024-02-21 NOTE — Telephone Encounter (Signed)
 Move up her post op appt

## 2024-02-24 DIAGNOSIS — I509 Heart failure, unspecified: Secondary | ICD-10-CM | POA: Diagnosis not present

## 2024-02-26 DIAGNOSIS — N186 End stage renal disease: Secondary | ICD-10-CM | POA: Diagnosis not present

## 2024-02-28 ENCOUNTER — Other Ambulatory Visit: Payer: Self-pay | Admitting: Internal Medicine

## 2024-02-28 ENCOUNTER — Ambulatory Visit
Admission: RE | Admit: 2024-02-28 | Discharge: 2024-02-28 | Disposition: A | Discharge status: Home / Self Care | Source: Ambulatory Visit | Attending: Family | Admitting: Family

## 2024-02-28 ENCOUNTER — Ambulatory Visit
Admission: RE | Admit: 2024-02-28 | Discharge: 2024-02-28 | Disposition: A | Discharge status: Home / Self Care | Source: Ambulatory Visit | Attending: Internal Medicine | Admitting: Internal Medicine

## 2024-02-28 DIAGNOSIS — R0602 Shortness of breath: Secondary | ICD-10-CM | POA: Insufficient documentation

## 2024-02-28 DIAGNOSIS — Z452 Encounter for adjustment and management of vascular access device: Secondary | ICD-10-CM | POA: Diagnosis not present

## 2024-02-28 DIAGNOSIS — R0989 Other specified symptoms and signs involving the circulatory and respiratory systems: Secondary | ICD-10-CM | POA: Diagnosis not present

## 2024-02-28 DIAGNOSIS — E8779 Other fluid overload: Secondary | ICD-10-CM | POA: Diagnosis not present

## 2024-02-28 DIAGNOSIS — N2581 Secondary hyperparathyroidism of renal origin: Secondary | ICD-10-CM | POA: Diagnosis not present

## 2024-02-28 DIAGNOSIS — R059 Cough, unspecified: Secondary | ICD-10-CM | POA: Diagnosis not present

## 2024-02-28 DIAGNOSIS — R918 Other nonspecific abnormal finding of lung field: Secondary | ICD-10-CM | POA: Diagnosis not present

## 2024-02-28 DIAGNOSIS — Z992 Dependence on renal dialysis: Secondary | ICD-10-CM | POA: Diagnosis not present

## 2024-02-28 DIAGNOSIS — N186 End stage renal disease: Secondary | ICD-10-CM | POA: Diagnosis not present

## 2024-02-29 ENCOUNTER — Telehealth (INDEPENDENT_AMBULATORY_CARE_PROVIDER_SITE_OTHER): Payer: Self-pay | Admitting: Vascular Surgery

## 2024-02-29 ENCOUNTER — Encounter (INDEPENDENT_AMBULATORY_CARE_PROVIDER_SITE_OTHER)

## 2024-02-29 ENCOUNTER — Ambulatory Visit (INDEPENDENT_AMBULATORY_CARE_PROVIDER_SITE_OTHER): Admitting: Vascular Surgery

## 2024-02-29 NOTE — Telephone Encounter (Signed)
 Patient left voicemail on the nurse line stating that she is still bleeding. She does not feel any better. Patient did have her post op appointment moved up to 03/21/2024.  Please advise

## 2024-02-29 NOTE — Telephone Encounter (Signed)
 Referral was pended on 01/25/24

## 2024-02-29 NOTE — Telephone Encounter (Signed)
 Let's bring her in sooner, she will not need studies

## 2024-03-01 DIAGNOSIS — Z992 Dependence on renal dialysis: Secondary | ICD-10-CM | POA: Diagnosis not present

## 2024-03-01 DIAGNOSIS — N2581 Secondary hyperparathyroidism of renal origin: Secondary | ICD-10-CM | POA: Diagnosis not present

## 2024-03-01 DIAGNOSIS — N186 End stage renal disease: Secondary | ICD-10-CM | POA: Diagnosis not present

## 2024-03-01 DIAGNOSIS — E8779 Other fluid overload: Secondary | ICD-10-CM | POA: Diagnosis not present

## 2024-03-02 ENCOUNTER — Ambulatory Visit (INDEPENDENT_AMBULATORY_CARE_PROVIDER_SITE_OTHER): Admitting: Nurse Practitioner

## 2024-03-02 ENCOUNTER — Encounter (INDEPENDENT_AMBULATORY_CARE_PROVIDER_SITE_OTHER): Payer: Self-pay | Admitting: Nurse Practitioner

## 2024-03-02 VITALS — BP 99/73 | HR 130 | Ht 66.0 in | Wt 162.0 lb

## 2024-03-02 DIAGNOSIS — I10 Essential (primary) hypertension: Secondary | ICD-10-CM

## 2024-03-02 DIAGNOSIS — Z992 Dependence on renal dialysis: Secondary | ICD-10-CM

## 2024-03-02 DIAGNOSIS — E785 Hyperlipidemia, unspecified: Secondary | ICD-10-CM | POA: Diagnosis not present

## 2024-03-02 DIAGNOSIS — N186 End stage renal disease: Secondary | ICD-10-CM | POA: Diagnosis not present

## 2024-03-03 ENCOUNTER — Telehealth (INDEPENDENT_AMBULATORY_CARE_PROVIDER_SITE_OTHER): Payer: Self-pay

## 2024-03-03 NOTE — Telephone Encounter (Signed)
 Spoke with the patient and she is scheduled with Dr. Marea on 03/09/24 for a right arm brachial axillary [facility] at [facility]. Pre-admit will call to schedule pre-op  at the [facility]. Pre-surgical instructions were discussed and will be mailed.

## 2024-03-06 ENCOUNTER — Encounter (INDEPENDENT_AMBULATORY_CARE_PROVIDER_SITE_OTHER): Payer: Self-pay | Admitting: Nurse Practitioner

## 2024-03-06 NOTE — H&P (View-Only) (Signed)
 Subjective:    Patient ID: [name], female    DOB: Oct 22, 1960, 63 y.o.   MRN: [id number] Chief Complaint  Patient presents with   Follow-up    Fu per FB Let's bring her in sooner, she will not need studies. Patient left voicemail on the nurse line stating that she is still bleeding.     The patient returns today for follow-up regarding her left upper extremity Artegraft following jump graft revision.  She recently underwent fistulogram which showed several areas of 80% stenosis which were angioplastied.  However despite intervention she continues to have significant issues with these.  Her primary issue is pain is very difficult for him to state this access.  In addition she notes that she has significant bleeding.  She has had this access for essentially greater than 10 years at this timeframe.  This was a revision from a brachiobasilic AV fistula in the left upper extremity.  She had vein mapping several months ago in anticipation that she may require a new upper extremity dialysis access.    Review of Systems  Hematological:  Bruises/bleeds easily.  All other systems reviewed and are negative.      Objective:   Physical Exam Vitals reviewed.  HENT:     Head: Normocephalic.  Cardiovascular:     Rate and Rhythm: Normal rate.  Pulmonary:     Effort: Pulmonary effort is normal.  Skin:    General: Skin is warm and dry.  Neurological:     Mental Status: She is alert and oriented to person, place, and time.  Psychiatric:        Mood and Affect: Mood normal.        Behavior: Behavior normal.        Thought Content: Thought content normal.        Judgment: Judgment normal.     BP 99/73   Pulse (!) 130   Ht 5' 6 (1.676 m)   Wt 162 lb (73.5 kg)   BMI 26.15 kg/m   Past Medical History:  Diagnosis Date   Acute lacunar infarction (HCC) 03/03/2014   a.) MRI brain 03/03/2014: two acute punctate lacunar infarcts anteriorly in the BILATERAL centrum semiovale   Adult  behavior problems    Anemia of chronic renal failure    Aortic atherosclerosis (HCC)    Atrial fibrillation and flutter (HCC)    a.) CHA2DS2VASc = 6 (sex, HFrEF, HTN, CVA x 2, prior MI/vascular disease) as of 02/04/2024; b.) rate/rhythm maintained on oral metoprolol  succinate; chronically anticoagulated with apixaban    Bipolar disorder (HCC)    Cardiomegaly    Cerebral microvascular disease    Chicken pox    Cholelithiasis    Chronic respiratory failure with hypoxia (HCC) 12/19/2021   COPD (chronic obstructive pulmonary disease) (HCC)    Coronary artery disease    DDD (degenerative disc disease), cervical    DDD (degenerative disc disease), thoracolumbar    Diverticulosis    Dyspnea    ESRD (end stage renal disease) on dialysis (M,W,F)    GERD (gastroesophageal reflux disease)    HCV (hepatitis C virus)    HFrEF (heart failure with reduced ejection fraction) (HCC)    History of delirium 04/12/2014   History of substance abuse (HCC)    a.) tobacco + cocaine   Hyperkalemia 11/13/2014   Hyperlipidemia    Hypertension    Hypotension 02/24/2014   Hypothyroidism    Ischemic cardiomyopathy    Nose colonized with MRSA 02/01/2024  a.) presurgical PCR (+) on 02/01/2024 prior to A/V FISTULAGRAM   NSTEMI (non-ST elevated myocardial infarction) (HCC) 10/08/2013   NSTEMI (non-ST elevated myocardial infarction) (HCC) 01/23/2002   a.) LHC 01/24/2002 --> multi-vessel CAD with IRA being 90% OM2 --> delayed PCI until 01/25/2002 --> 3.0 x 23 mm BX Velocity stent   Obesity    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    On apixaban  therapy    Peripheral vascular disease (HCC)    Postoperative anemia due to acute blood loss 02/27/2014   Postoperative cerebrovascular infarction following cardiac surgery (HCC) 02/21/2014   a.) developed RIGHT upper/lower extremity weakness following cardiac revascularization --> neuro consult and head CT --> Hypodensity noted in the area of the LEFT motor  cortex consistent with a component of acute ischemia   Pulmonary hypertension (HCC)    RBBB (right bundle branch block)    Renal osteodystrophy    S/P CABG x 3 02/20/2014   a.) LIMA-LAD, SVG-OM1, SVG-PDA   Stroke (HCC) 05/23/2010   a.) brain MRI 05/23/2010: BILATERAL acute infarcts and old prior infarcts; RIGHT frontal lobe extending into the white matter. Small focus of restricted diffusion in the cortex of the medial LEFT frontoparietal lobe. Periventricular T2 and multiple foci of T2 hyperintensity in the subcortical white matter and increased FLAIR signal in the cortex of the LEFT frontal lobe.   Syncope and collapse 07/25/2020    Social History   Socioeconomic History   Marital status: Single    Spouse name: Not on file   Number of children: Not on file   Years of education: Not on file   Highest education level: Not on file  Occupational History   Not on file  Tobacco Use   Smoking status: Every Day    Current packs/day: 0.25    Average packs/day: 0.3 packs/day for 20.0 years (5.0 ttl pk-yrs)    Types: Cigarettes   Smokeless tobacco: Never   Tobacco comments:    smokes 1 - 1.5 cigarettes a day  Vaping Use   Vaping status: Never Used  Substance and Sexual Activity   Alcohol use: No    Comment: social   Drug use: No   Sexual activity: Not Currently    Birth control/protection: Post-menopausal  Other Topics Concern   Not on file  Social History Narrative   Lives alone   Social Drivers of Health   Financial Resource Strain: Not on file  Food Insecurity: No Food Insecurity (05/18/2023)   Hunger Vital Sign    Worried About Running Out of Food in the Last Year: Never true    Ran Out of Food in the Last Year: Never true  Transportation Needs: No Transportation Needs (05/18/2023)   PRAPARE - Administrator, Civil Service (Medical): No    Lack of Transportation (Non-Medical): No  Physical Activity: Not on file  Stress: Not on file  Social Connections: Not  on file  Intimate Partner Violence: Not At Risk (05/18/2023)   Humiliation, Afraid, Rape, and Kick questionnaire    Fear of Current or Ex-Partner: No    Emotionally Abused: No    Physically Abused: No    Sexually Abused: No    Past Surgical History:  Procedure Laterality Date   A/V FISTULAGRAM Left 03/30/2019   Procedure: A/V FISTULAGRAM;  Surgeon: Marea Selinda RAMAN, MD;  Location: [facility];  Service: Cardiovascular;  Laterality: Left;   A/V FISTULAGRAM Left 10/06/2019   Procedure: A/V FISTULAGRAM;  Surgeon: Marea,  Selinda RAMAN, MD;  Location: [facility];  Service: Cardiovascular;  Laterality: Left;   A/V FISTULAGRAM Left 05/19/2021   Procedure: A/V FISTULAGRAM;  Surgeon: Marea Selinda RAMAN, MD;  Location: [facility];  Service: Cardiovascular;  Laterality: Left;   A/V FISTULAGRAM Left 01/29/2022   Procedure: A/V Fistulagram;  Surgeon: Marea Selinda RAMAN, MD;  Location: [facility];  Service: Cardiovascular;  Laterality: Left;   A/V FISTULAGRAM Left 04/30/2022   Procedure: A/V Fistulagram;  Surgeon: Marea Selinda RAMAN, MD;  Location: [facility];  Service: Cardiovascular;  Laterality: Left;   A/V FISTULAGRAM Left 08/20/2022   Procedure: A/V Fistulagram;  Surgeon: Marea Selinda RAMAN, MD;  Location: [facility];  Service: Cardiovascular;  Laterality: Left;   A/V FISTULAGRAM N/A 12/03/2022   Procedure: A/V Fistulagram;  Surgeon: Marea Selinda RAMAN, MD;  Location: [facility];  Service: Cardiovascular;  Laterality: N/A;   A/V FISTULAGRAM Left 02/11/2023   Procedure: A/V Fistulagram;  Surgeon: Marea Selinda RAMAN, MD;  Location: [facility];  Service: Cardiovascular;  Laterality: Left;   A/V FISTULAGRAM Left 02/07/2024   Procedure: A/V Fistulagram;  Surgeon: Marea Selinda RAMAN, MD;  Location: [facility];  Service: Cardiovascular;  Laterality: Left;   COLONOSCOPY     COLONOSCOPY WITH PROPOFOL  N/A 04/22/2021   Procedure: COLONOSCOPY WITH PROPOFOL ;  Surgeon:  Maryruth Ole DASEN, MD;  Location: [facility];  Service: Endoscopy;  Laterality: N/A;   CORONARY ANGIOPLASTY WITH STENT PLACEMENT Left 01/25/2002   Procedure: CORONARY ANGIOPLASTY WITH STENT PLACEMENT; Location: [facility]; Surgeon: Margie Lovelace, MD   CORONARY ARTERY BYPASS GRAFT N/A 02/20/2014   Procedure: CORONARY ARTERY BYPASS GRAFT; Location: [facility]; Surgeon: Reyes Fruits, MD   DIALYSIS FISTULA CREATION     DIALYSIS/PERMA CATHETER INSERTION N/A 04/08/2023   Procedure: DIALYSIS/PERMA CATHETER INSERTION;  Surgeon: Marea Selinda RAMAN, MD;  Location: [facility];  Service: Cardiovascular;  Laterality: N/A;   DIALYSIS/PERMA CATHETER REMOVAL N/A 07/06/2023   Procedure: DIALYSIS/PERMA CATHETER REMOVAL;  Surgeon: [name] Cordella MATSU, MD;  Location: [facility];  Service: Cardiovascular;  Laterality: N/A;   ESOPHAGOGASTRODUODENOSCOPY     ESOPHAGOGASTRODUODENOSCOPY N/A 04/22/2021   Procedure: ESOPHAGOGASTRODUODENOSCOPY (EGD);  Surgeon: Maryruth Ole DASEN, MD;  Location: [facility];  Service: Endoscopy;  Laterality: N/A;   FLEXIBLE SIGMOIDOSCOPY N/A 02/23/2019   Procedure: FLEXIBLE SIGMOIDOSCOPY;  Surgeon: Gaylyn Gladis PENNER, MD;  Location: [facility];  Service: Endoscopy;  Laterality: N/A;   IABP INSERTION  02/20/2014   Procedure: INTRA-AORTIC BALLOON PUMP INSERTION; Location: [facility]; Surgeon: Reyes Fruits, MD   LEFT HEART CATH AND CORONARY ANGIOGRAPHY Left 01/24/2002   Procedure: LEFT HEART CATH AND CORONARY ANGIOGRAPHY; Location: [facility]; Surgeon: Margie Lovelace, MD   LEFT HEART CATH AND CORONARY ANGIOGRAPHY Left 05/25/2012   Procedure: LEFT HEART CATH AND CORONARY ANGIOGRAPHY; Location: [facility]; Surgeon: Margie Lovelace, MD   LEFT HEART CATH AND CORONARY ANGIOGRAPHY Left 10/09/2013   Procedure: LEFT HEART CATH AND CORONARY ANGIOGRAPHY; Location: [facility]; Surgeon: Vinie Jude, MD   LEFT HEART CATH AND CORS/GRAFTS ANGIOGRAPHY N/A 08/08/2018   Procedure: LEFT HEART CATH AND CORS/GRAFTS  ANGIOGRAPHY;  Surgeon: Fernand Denyse LABOR, MD;  Location: [facility];  Service: Cardiovascular;  Laterality: N/A;   LEFT HEART CATH AND CORS/GRAFTS ANGIOGRAPHY N/A 01/30/2019   Procedure: LEFT HEART CATH AND CORS/GRAFTS ANGIOGRAPHY;  Surgeon: Fernand Denyse LABOR, MD;  Location: [facility];  Service: Cardiovascular;  Laterality: N/A;   ultrasound guided pericardiocentesis  Family History  Problem Relation Age of Onset   Hypertension Mother    Ovarian cancer Mother    Diabetes type II Mother    Hypertension Father    Kidney disease Father    Hypertension Sister    Diabetes type II Maternal Grandmother    Breast cancer Maternal Grandmother    Breast cancer Maternal Aunt    Hypertension Other    Cancer Other    Renal Disease Other     Allergies  Allergen Reactions   Morphine  And Codeine Hives   Levaquin [Levofloxacin] Itching and Other (See Comments)    Severe itching; prickly sensation; severe left leg pain   Enalapril  Other (See Comments)    hallucinations   Levothyroxine      Immense pain in left hip area    Latex Rash   Tape Rash and Other (See Comments)       Latest Ref Rng & Units 02/01/2024   10:26 AM 05/19/2023    9:40 AM 05/19/2023    7:43 AM  CBC  WBC 4.0 - 10.5 K/uL 4.9  5.3  4.7   Hemoglobin 12.0 - 15.0 g/dL 86.4  88.9  89.3   Hematocrit 36.0 - 46.0 % 41.2  34.6  32.9   Platelets 150 - 400 K/uL 116  128  117       CMP     Component Value Date/Time   NA 138 02/01/2024 1026   NA 139 07/09/2014 0503   K 5.4 (H) 02/07/2024 0907   K 4.7 07/11/2014 0958   CL 97 (L) 02/01/2024 1026   CL 98 07/09/2014 0503   CO2 24 02/01/2024 1026   CO2 30 07/09/2014 0503   GLUCOSE 72 02/01/2024 1026   GLUCOSE 107 (H) 07/09/2014 0503   BUN 46 (H) 02/01/2024 1026   BUN 39 (H) 07/09/2014 0503   CREATININE 7.02 (H) 02/01/2024 1026   CREATININE 5.53 (H) 07/09/2014 0503   CALCIUM  8.5 (L) 02/01/2024 1026   CALCIUM  8.3 (L) 07/09/2014 0503   PROT 7.7 05/17/2023  0747   PROT 8.4 (H) 07/07/2014 2207   ALBUMIN  3.6 05/19/2023 0940   ALBUMIN  3.1 (L) 07/07/2014 2207   AST 24 05/17/2023 0747   AST 76 (H) 07/07/2014 2207   ALT 16 05/17/2023 0747   ALT 41 07/07/2014 2207   ALKPHOS 105 05/17/2023 0747   ALKPHOS 107 07/07/2014 2207   BILITOT 0.8 05/17/2023 0747   BILITOT 0.7 07/07/2014 2207   GFRNONAA 6 (L) 02/01/2024 1026   GFRNONAA 9 (L) 07/09/2014 0503   GFRNONAA 9 (L) 02/11/2014 0300     No results found.     Assessment & Plan:   1. ESRD on hemodialysis (HCC) (Primary) Following discussion with the patient we will move forward with a right brachial axillary AV graft.  However I did explain to the patient that if it is found that she has adequate vein diameter she may of a brachiocephalic AV fistula placed.  This is because her vein mapping was done several months ago.  In addition I discussed the stability of placing a PermCath for the rest of her upper extremity however she has had a negative experience with infection with a PermCath and is very against having 1 placed unless absolutely necessary.  Based on this we will continue to let her utilize her right axis but she does understand that if the bleeding becomes uncontrollable a PermCath may be unavoidable.  Following this discussion the patient is agreeable to proceed with the  plan as noted above we discussed risk, benefits and alternatives and she is agreeable to proceed.  2. Primary hypertension Continue antihypertensive medications as already ordered, these medications have been reviewed and there are no changes at this time.  3. Hyperlipidemia, unspecified hyperlipidemia type Continue statin as ordered and reviewed, no changes at this time   Current Outpatient Medications on File Prior to Visit  Medication Sig Dispense Refill   albuterol  (VENTOLIN  HFA) 108 (90 Base) MCG/ACT inhaler Inhale 1-2 puffs into the lungs every 6 (six) hours as needed for wheezing or shortness of breath. 20.1 g  3   apixaban  (ELIQUIS ) 5 MG TABS tablet Take 5 mg by mouth 2 (two) times daily.     hydrOXYzine  (ATARAX /VISTARIL ) 50 MG tablet Take 50 mg by mouth at bedtime.     levothyroxine  (SYNTHROID ) 75 MCG tablet Take 75 mcg by mouth daily before breakfast.      lidocaine -prilocaine  (EMLA ) cream Apply 1 Application topically once.     metoprolol  succinate (TOPROL -XL) 25 MG 24 hr tablet Take 1 tablet by mouth daily.     midodrine  (PROAMATINE ) 10 MG tablet Take 1 tablet (10 mg total) by mouth 3 (three) times daily. 90 tablet 0   mupirocin  ointment (BACTROBAN ) 2 % Apply small amount to the inside of both nostrils TWICE a day for the next 5 days. 15 g 0   oxyCODONE -acetaminophen  (PERCOCET/ROXICET) 5-325 MG tablet Take 1 tablet by mouth every 6 (six) hours as needed for severe pain (pain score 7-10). 20 tablet 0   pantoprazole  (PROTONIX ) 40 MG tablet Take 40 mg by mouth daily.     ranolazine  (RANEXA ) 1000 MG SR tablet Take 1,000 mg by mouth 2 (two) times daily.     rosuvastatin  (CRESTOR ) 20 MG tablet Take 20 mg by mouth at bedtime.     sevelamer  carbonate (RENVELA ) 800 MG tablet Take 3,200 mg by mouth 3 (three) times daily with meals.     triamcinolone  cream (KENALOG ) 0.1 % Apply 1 Application topically 2 (two) times daily. 453.6 g 2   Current Facility-Administered Medications on File Prior to Visit  Medication Dose Route Frequency Provider Last Rate Last Admin   heparin  injection    PRN Dew, Jason S, MD   10,000 Units at 04/08/23 1412    There are no Patient Instructions on file for this visit. No follow-ups on file.   Jonmarc Bodkin E Eliette Drumwright, NP

## 2024-03-06 NOTE — Progress Notes (Signed)
 Subjective:    Patient ID: [name], female    DOB: Oct 22, 1960, 63 y.o.   MRN: [id number] Chief Complaint  Patient presents with   Follow-up    Fu per FB Let's bring her in sooner, she will not need studies. Patient left voicemail on the nurse line stating that she is still bleeding.     The patient returns today for follow-up regarding her left upper extremity Artegraft following jump graft revision.  She recently underwent fistulogram which showed several areas of 80% stenosis which were angioplastied.  However despite intervention she continues to have significant issues with these.  Her primary issue is pain is very difficult for him to state this access.  In addition she notes that she has significant bleeding.  She has had this access for essentially greater than 10 years at this timeframe.  This was a revision from a brachiobasilic AV fistula in the left upper extremity.  She had vein mapping several months ago in anticipation that she may require a new upper extremity dialysis access.    Review of Systems  Hematological:  Bruises/bleeds easily.  All other systems reviewed and are negative.      Objective:   Physical Exam Vitals reviewed.  HENT:     Head: Normocephalic.  Cardiovascular:     Rate and Rhythm: Normal rate.  Pulmonary:     Effort: Pulmonary effort is normal.  Skin:    General: Skin is warm and dry.  Neurological:     Mental Status: She is alert and oriented to person, place, and time.  Psychiatric:        Mood and Affect: Mood normal.        Behavior: Behavior normal.        Thought Content: Thought content normal.        Judgment: Judgment normal.     BP 99/73   Pulse (!) 130   Ht 5' 6 (1.676 m)   Wt 162 lb (73.5 kg)   BMI 26.15 kg/m   Past Medical History:  Diagnosis Date   Acute lacunar infarction (HCC) 03/03/2014   a.) MRI brain 03/03/2014: two acute punctate lacunar infarcts anteriorly in the BILATERAL centrum semiovale   Adult  behavior problems    Anemia of chronic renal failure    Aortic atherosclerosis (HCC)    Atrial fibrillation and flutter (HCC)    a.) CHA2DS2VASc = 6 (sex, HFrEF, HTN, CVA x 2, prior MI/vascular disease) as of 02/04/2024; b.) rate/rhythm maintained on oral metoprolol  succinate; chronically anticoagulated with apixaban    Bipolar disorder (HCC)    Cardiomegaly    Cerebral microvascular disease    Chicken pox    Cholelithiasis    Chronic respiratory failure with hypoxia (HCC) 12/19/2021   COPD (chronic obstructive pulmonary disease) (HCC)    Coronary artery disease    DDD (degenerative disc disease), cervical    DDD (degenerative disc disease), thoracolumbar    Diverticulosis    Dyspnea    ESRD (end stage renal disease) on dialysis (M,W,F)    GERD (gastroesophageal reflux disease)    HCV (hepatitis C virus)    HFrEF (heart failure with reduced ejection fraction) (HCC)    History of delirium 04/12/2014   History of substance abuse (HCC)    a.) tobacco + cocaine   Hyperkalemia 11/13/2014   Hyperlipidemia    Hypertension    Hypotension 02/24/2014   Hypothyroidism    Ischemic cardiomyopathy    Nose colonized with MRSA 02/01/2024  a.) presurgical PCR (+) on 02/01/2024 prior to A/V FISTULAGRAM   NSTEMI (non-ST elevated myocardial infarction) (HCC) 10/08/2013   NSTEMI (non-ST elevated myocardial infarction) (HCC) 01/23/2002   a.) LHC 01/24/2002 --> multi-vessel CAD with IRA being 90% OM2 --> delayed PCI until 01/25/2002 --> 3.0 x 23 mm BX Velocity stent   Obesity    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    On apixaban  therapy    Peripheral vascular disease (HCC)    Postoperative anemia due to acute blood loss 02/27/2014   Postoperative cerebrovascular infarction following cardiac surgery (HCC) 02/21/2014   a.) developed RIGHT upper/lower extremity weakness following cardiac revascularization --> neuro consult and head CT --> Hypodensity noted in the area of the LEFT motor  cortex consistent with a component of acute ischemia   Pulmonary hypertension (HCC)    RBBB (right bundle branch block)    Renal osteodystrophy    S/P CABG x 3 02/20/2014   a.) LIMA-LAD, SVG-OM1, SVG-PDA   Stroke (HCC) 05/23/2010   a.) brain MRI 05/23/2010: BILATERAL acute infarcts and old prior infarcts; RIGHT frontal lobe extending into the white matter. Small focus of restricted diffusion in the cortex of the medial LEFT frontoparietal lobe. Periventricular T2 and multiple foci of T2 hyperintensity in the subcortical white matter and increased FLAIR signal in the cortex of the LEFT frontal lobe.   Syncope and collapse 07/25/2020    Social History   Socioeconomic History   Marital status: Single    Spouse name: Not on file   Number of children: Not on file   Years of education: Not on file   Highest education level: Not on file  Occupational History   Not on file  Tobacco Use   Smoking status: Every Day    Current packs/day: 0.25    Average packs/day: 0.3 packs/day for 20.0 years (5.0 ttl pk-yrs)    Types: Cigarettes   Smokeless tobacco: Never   Tobacco comments:    smokes 1 - 1.5 cigarettes a day  Vaping Use   Vaping status: Never Used  Substance and Sexual Activity   Alcohol use: No    Comment: social   Drug use: No   Sexual activity: Not Currently    Birth control/protection: Post-menopausal  Other Topics Concern   Not on file  Social History Narrative   Lives alone   Social Drivers of Health   Financial Resource Strain: Not on file  Food Insecurity: No Food Insecurity (05/18/2023)   Hunger Vital Sign    Worried About Running Out of Food in the Last Year: Never true    Ran Out of Food in the Last Year: Never true  Transportation Needs: No Transportation Needs (05/18/2023)   PRAPARE - Administrator, Civil Service (Medical): No    Lack of Transportation (Non-Medical): No  Physical Activity: Not on file  Stress: Not on file  Social Connections: Not  on file  Intimate Partner Violence: Not At Risk (05/18/2023)   Humiliation, Afraid, Rape, and Kick questionnaire    Fear of Current or Ex-Partner: No    Emotionally Abused: No    Physically Abused: No    Sexually Abused: No    Past Surgical History:  Procedure Laterality Date   A/V FISTULAGRAM Left 03/30/2019   Procedure: A/V FISTULAGRAM;  Surgeon: Marea Selinda RAMAN, MD;  Location: [facility];  Service: Cardiovascular;  Laterality: Left;   A/V FISTULAGRAM Left 10/06/2019   Procedure: A/V FISTULAGRAM;  Surgeon: Marea,  Selinda RAMAN, MD;  Location: [facility];  Service: Cardiovascular;  Laterality: Left;   A/V FISTULAGRAM Left 05/19/2021   Procedure: A/V FISTULAGRAM;  Surgeon: Marea Selinda RAMAN, MD;  Location: [facility];  Service: Cardiovascular;  Laterality: Left;   A/V FISTULAGRAM Left 01/29/2022   Procedure: A/V Fistulagram;  Surgeon: Marea Selinda RAMAN, MD;  Location: [facility];  Service: Cardiovascular;  Laterality: Left;   A/V FISTULAGRAM Left 04/30/2022   Procedure: A/V Fistulagram;  Surgeon: Marea Selinda RAMAN, MD;  Location: [facility];  Service: Cardiovascular;  Laterality: Left;   A/V FISTULAGRAM Left 08/20/2022   Procedure: A/V Fistulagram;  Surgeon: Marea Selinda RAMAN, MD;  Location: [facility];  Service: Cardiovascular;  Laterality: Left;   A/V FISTULAGRAM N/A 12/03/2022   Procedure: A/V Fistulagram;  Surgeon: Marea Selinda RAMAN, MD;  Location: [facility];  Service: Cardiovascular;  Laterality: N/A;   A/V FISTULAGRAM Left 02/11/2023   Procedure: A/V Fistulagram;  Surgeon: Marea Selinda RAMAN, MD;  Location: [facility];  Service: Cardiovascular;  Laterality: Left;   A/V FISTULAGRAM Left 02/07/2024   Procedure: A/V Fistulagram;  Surgeon: Marea Selinda RAMAN, MD;  Location: [facility];  Service: Cardiovascular;  Laterality: Left;   COLONOSCOPY     COLONOSCOPY WITH PROPOFOL  N/A 04/22/2021   Procedure: COLONOSCOPY WITH PROPOFOL ;  Surgeon:  Maryruth Ole DASEN, MD;  Location: [facility];  Service: Endoscopy;  Laterality: N/A;   CORONARY ANGIOPLASTY WITH STENT PLACEMENT Left 01/25/2002   Procedure: CORONARY ANGIOPLASTY WITH STENT PLACEMENT; Location: [facility]; Surgeon: Margie Lovelace, MD   CORONARY ARTERY BYPASS GRAFT N/A 02/20/2014   Procedure: CORONARY ARTERY BYPASS GRAFT; Location: [facility]; Surgeon: Reyes Fruits, MD   DIALYSIS FISTULA CREATION     DIALYSIS/PERMA CATHETER INSERTION N/A 04/08/2023   Procedure: DIALYSIS/PERMA CATHETER INSERTION;  Surgeon: Marea Selinda RAMAN, MD;  Location: [facility];  Service: Cardiovascular;  Laterality: N/A;   DIALYSIS/PERMA CATHETER REMOVAL N/A 07/06/2023   Procedure: DIALYSIS/PERMA CATHETER REMOVAL;  Surgeon: [name] Cordella MATSU, MD;  Location: [facility];  Service: Cardiovascular;  Laterality: N/A;   ESOPHAGOGASTRODUODENOSCOPY     ESOPHAGOGASTRODUODENOSCOPY N/A 04/22/2021   Procedure: ESOPHAGOGASTRODUODENOSCOPY (EGD);  Surgeon: Maryruth Ole DASEN, MD;  Location: [facility];  Service: Endoscopy;  Laterality: N/A;   FLEXIBLE SIGMOIDOSCOPY N/A 02/23/2019   Procedure: FLEXIBLE SIGMOIDOSCOPY;  Surgeon: Gaylyn Gladis PENNER, MD;  Location: [facility];  Service: Endoscopy;  Laterality: N/A;   IABP INSERTION  02/20/2014   Procedure: INTRA-AORTIC BALLOON PUMP INSERTION; Location: [facility]; Surgeon: Reyes Fruits, MD   LEFT HEART CATH AND CORONARY ANGIOGRAPHY Left 01/24/2002   Procedure: LEFT HEART CATH AND CORONARY ANGIOGRAPHY; Location: [facility]; Surgeon: Margie Lovelace, MD   LEFT HEART CATH AND CORONARY ANGIOGRAPHY Left 05/25/2012   Procedure: LEFT HEART CATH AND CORONARY ANGIOGRAPHY; Location: [facility]; Surgeon: Margie Lovelace, MD   LEFT HEART CATH AND CORONARY ANGIOGRAPHY Left 10/09/2013   Procedure: LEFT HEART CATH AND CORONARY ANGIOGRAPHY; Location: [facility]; Surgeon: Vinie Jude, MD   LEFT HEART CATH AND CORS/GRAFTS ANGIOGRAPHY N/A 08/08/2018   Procedure: LEFT HEART CATH AND CORS/GRAFTS  ANGIOGRAPHY;  Surgeon: Fernand Denyse LABOR, MD;  Location: [facility];  Service: Cardiovascular;  Laterality: N/A;   LEFT HEART CATH AND CORS/GRAFTS ANGIOGRAPHY N/A 01/30/2019   Procedure: LEFT HEART CATH AND CORS/GRAFTS ANGIOGRAPHY;  Surgeon: Fernand Denyse LABOR, MD;  Location: [facility];  Service: Cardiovascular;  Laterality: N/A;   ultrasound guided pericardiocentesis  Family History  Problem Relation Age of Onset   Hypertension Mother    Ovarian cancer Mother    Diabetes type II Mother    Hypertension Father    Kidney disease Father    Hypertension Sister    Diabetes type II Maternal Grandmother    Breast cancer Maternal Grandmother    Breast cancer Maternal Aunt    Hypertension Other    Cancer Other    Renal Disease Other     Allergies  Allergen Reactions   Morphine  And Codeine Hives   Levaquin [Levofloxacin] Itching and Other (See Comments)    Severe itching; prickly sensation; severe left leg pain   Enalapril  Other (See Comments)    hallucinations   Levothyroxine      Immense pain in left hip area    Latex Rash   Tape Rash and Other (See Comments)       Latest Ref Rng & Units 02/01/2024   10:26 AM 05/19/2023    9:40 AM 05/19/2023    7:43 AM  CBC  WBC 4.0 - 10.5 K/uL 4.9  5.3  4.7   Hemoglobin 12.0 - 15.0 g/dL 86.4  88.9  89.3   Hematocrit 36.0 - 46.0 % 41.2  34.6  32.9   Platelets 150 - 400 K/uL 116  128  117       CMP     Component Value Date/Time   NA 138 02/01/2024 1026   NA 139 07/09/2014 0503   K 5.4 (H) 02/07/2024 0907   K 4.7 07/11/2014 0958   CL 97 (L) 02/01/2024 1026   CL 98 07/09/2014 0503   CO2 24 02/01/2024 1026   CO2 30 07/09/2014 0503   GLUCOSE 72 02/01/2024 1026   GLUCOSE 107 (H) 07/09/2014 0503   BUN 46 (H) 02/01/2024 1026   BUN 39 (H) 07/09/2014 0503   CREATININE 7.02 (H) 02/01/2024 1026   CREATININE 5.53 (H) 07/09/2014 0503   CALCIUM  8.5 (L) 02/01/2024 1026   CALCIUM  8.3 (L) 07/09/2014 0503   PROT 7.7 05/17/2023  0747   PROT 8.4 (H) 07/07/2014 2207   ALBUMIN  3.6 05/19/2023 0940   ALBUMIN  3.1 (L) 07/07/2014 2207   AST 24 05/17/2023 0747   AST 76 (H) 07/07/2014 2207   ALT 16 05/17/2023 0747   ALT 41 07/07/2014 2207   ALKPHOS 105 05/17/2023 0747   ALKPHOS 107 07/07/2014 2207   BILITOT 0.8 05/17/2023 0747   BILITOT 0.7 07/07/2014 2207   GFRNONAA 6 (L) 02/01/2024 1026   GFRNONAA 9 (L) 07/09/2014 0503   GFRNONAA 9 (L) 02/11/2014 0300     No results found.     Assessment & Plan:   1. ESRD on hemodialysis (HCC) (Primary) Following discussion with the patient we will move forward with a right brachial axillary AV graft.  However I did explain to the patient that if it is found that she has adequate vein diameter she may of a brachiocephalic AV fistula placed.  This is because her vein mapping was done several months ago.  In addition I discussed the stability of placing a PermCath for the rest of her upper extremity however she has had a negative experience with infection with a PermCath and is very against having 1 placed unless absolutely necessary.  Based on this we will continue to let her utilize her right axis but she does understand that if the bleeding becomes uncontrollable a PermCath may be unavoidable.  Following this discussion the patient is agreeable to proceed with the  plan as noted above we discussed risk, benefits and alternatives and she is agreeable to proceed.  2. Primary hypertension Continue antihypertensive medications as already ordered, these medications have been reviewed and there are no changes at this time.  3. Hyperlipidemia, unspecified hyperlipidemia type Continue statin as ordered and reviewed, no changes at this time   Current Outpatient Medications on File Prior to Visit  Medication Sig Dispense Refill   albuterol  (VENTOLIN  HFA) 108 (90 Base) MCG/ACT inhaler Inhale 1-2 puffs into the lungs every 6 (six) hours as needed for wheezing or shortness of breath. 20.1 g  3   apixaban  (ELIQUIS ) 5 MG TABS tablet Take 5 mg by mouth 2 (two) times daily.     hydrOXYzine  (ATARAX /VISTARIL ) 50 MG tablet Take 50 mg by mouth at bedtime.     levothyroxine  (SYNTHROID ) 75 MCG tablet Take 75 mcg by mouth daily before breakfast.      lidocaine -prilocaine  (EMLA ) cream Apply 1 Application topically once.     metoprolol  succinate (TOPROL -XL) 25 MG 24 hr tablet Take 1 tablet by mouth daily.     midodrine  (PROAMATINE ) 10 MG tablet Take 1 tablet (10 mg total) by mouth 3 (three) times daily. 90 tablet 0   mupirocin  ointment (BACTROBAN ) 2 % Apply small amount to the inside of both nostrils TWICE a day for the next 5 days. 15 g 0   oxyCODONE -acetaminophen  (PERCOCET/ROXICET) 5-325 MG tablet Take 1 tablet by mouth every 6 (six) hours as needed for severe pain (pain score 7-10). 20 tablet 0   pantoprazole  (PROTONIX ) 40 MG tablet Take 40 mg by mouth daily.     ranolazine  (RANEXA ) 1000 MG SR tablet Take 1,000 mg by mouth 2 (two) times daily.     rosuvastatin  (CRESTOR ) 20 MG tablet Take 20 mg by mouth at bedtime.     sevelamer  carbonate (RENVELA ) 800 MG tablet Take 3,200 mg by mouth 3 (three) times daily with meals.     triamcinolone  cream (KENALOG ) 0.1 % Apply 1 Application topically 2 (two) times daily. 453.6 g 2   Current Facility-Administered Medications on File Prior to Visit  Medication Dose Route Frequency Provider Last Rate Last Admin   heparin  injection    PRN Dew, Jason S, MD   10,000 Units at 04/08/23 1412    There are no Patient Instructions on file for this visit. No follow-ups on file.   Jonmarc Bodkin E Eliette Drumwright, NP

## 2024-03-07 ENCOUNTER — Encounter: Payer: Self-pay | Admitting: Family

## 2024-03-07 ENCOUNTER — Other Ambulatory Visit: Payer: Self-pay

## 2024-03-07 ENCOUNTER — Encounter: Payer: Self-pay | Admitting: Vascular Surgery

## 2024-03-07 ENCOUNTER — Other Ambulatory Visit (INDEPENDENT_AMBULATORY_CARE_PROVIDER_SITE_OTHER): Payer: Self-pay | Admitting: Nurse Practitioner

## 2024-03-07 ENCOUNTER — Ambulatory Visit: Admitting: Family

## 2024-03-07 ENCOUNTER — Encounter
Admission: RE | Admit: 2024-03-07 | Discharge: 2024-03-07 | Disposition: A | Discharge status: Home / Self Care | Source: Ambulatory Visit | Attending: Vascular Surgery | Admitting: Vascular Surgery

## 2024-03-07 ENCOUNTER — Telehealth: Payer: Self-pay | Admitting: Urgent Care

## 2024-03-07 VITALS — BP 104/76 | Ht 66.0 in | Wt 158.0 lb

## 2024-03-07 VITALS — BP 92/66 | HR 111 | Resp 16 | Wt 160.5 lb

## 2024-03-07 DIAGNOSIS — G894 Chronic pain syndrome: Secondary | ICD-10-CM | POA: Diagnosis not present

## 2024-03-07 DIAGNOSIS — Z01810 Encounter for preprocedural cardiovascular examination: Secondary | ICD-10-CM | POA: Diagnosis not present

## 2024-03-07 DIAGNOSIS — Z992 Dependence on renal dialysis: Secondary | ICD-10-CM

## 2024-03-07 DIAGNOSIS — F1910 Other psychoactive substance abuse, uncomplicated: Secondary | ICD-10-CM

## 2024-03-07 DIAGNOSIS — Z01812 Encounter for preprocedural laboratory examination: Secondary | ICD-10-CM

## 2024-03-07 DIAGNOSIS — Z22322 Carrier or suspected carrier of Methicillin resistant Staphylococcus aureus: Secondary | ICD-10-CM

## 2024-03-07 DIAGNOSIS — Z01818 Encounter for other preprocedural examination: Secondary | ICD-10-CM | POA: Diagnosis not present

## 2024-03-07 DIAGNOSIS — Z0130 Encounter for examination of blood pressure without abnormal findings: Secondary | ICD-10-CM

## 2024-03-07 DIAGNOSIS — K7469 Other cirrhosis of liver: Secondary | ICD-10-CM | POA: Diagnosis not present

## 2024-03-07 DIAGNOSIS — F02A0 Dementia in other diseases classified elsewhere, mild, without behavioral disturbance, psychotic disturbance, mood disturbance, and anxiety: Secondary | ICD-10-CM

## 2024-03-07 DIAGNOSIS — I5042 Chronic combined systolic (congestive) and diastolic (congestive) heart failure: Secondary | ICD-10-CM

## 2024-03-07 DIAGNOSIS — N186 End stage renal disease: Secondary | ICD-10-CM | POA: Diagnosis not present

## 2024-03-07 DIAGNOSIS — I158 Other secondary hypertension: Secondary | ICD-10-CM | POA: Diagnosis not present

## 2024-03-07 DIAGNOSIS — F3170 Bipolar disorder, currently in remission, most recent episode unspecified: Secondary | ICD-10-CM

## 2024-03-07 HISTORY — DX: Unspecified osteoarthritis, unspecified site: M19.90

## 2024-03-07 HISTORY — DX: Headache, unspecified: R51.9

## 2024-03-07 HISTORY — DX: Pneumonia, unspecified organism: J18.9

## 2024-03-07 LAB — BASIC METABOLIC PANEL WITH GFR
Anion gap: 18 — ABNORMAL HIGH (ref 5–15)
BUN: 52 mg/dL — ABNORMAL HIGH (ref 8–23)
CO2: 24 mmol/L (ref 22–32)
Calcium: 8.7 mg/dL — ABNORMAL LOW (ref 8.9–10.3)
Chloride: 97 mmol/L — ABNORMAL LOW (ref 98–111)
Creatinine, Ser: 6.96 mg/dL — ABNORMAL HIGH (ref 0.44–1.00)
GFR, Estimated: 6 mL/min — ABNORMAL LOW (ref >60)
Glucose, Bld: 88 mg/dL (ref 70–99)
Potassium: 4 mmol/L (ref 3.5–5.1)
Sodium: 139 mmol/L (ref 135–145)

## 2024-03-07 LAB — CBC WITH DIFFERENTIAL/PLATELET
Abs Immature Granulocytes: 0.03 K/uL (ref 0.00–0.07)
Basophils Absolute: 0 K/uL (ref 0.0–0.1)
Basophils Relative: 0 %
Eosinophils Absolute: 0 K/uL (ref 0.0–0.5)
Eosinophils Relative: 1 %
HCT: 44.9 % (ref 36.0–46.0)
Hemoglobin: 14.3 g/dL (ref 12.0–15.0)
Immature Granulocytes: 1 %
Lymphocytes Relative: 19 %
Lymphs Abs: 1.2 K/uL (ref 0.7–4.0)
MCH: 31.6 pg (ref 26.0–34.0)
MCHC: 31.8 g/dL (ref 30.0–36.0)
MCV: 99.3 fL (ref 80.0–100.0)
Monocytes Absolute: 0.8 K/uL (ref 0.1–1.0)
Monocytes Relative: 12 %
Neutro Abs: 4.3 K/uL (ref 1.7–7.7)
Neutrophils Relative %: 67 %
Platelets: 146 K/uL — ABNORMAL LOW (ref 150–400)
RBC: 4.52 MIL/uL (ref 3.87–5.11)
RDW: 15.7 % — ABNORMAL HIGH (ref 11.5–15.5)
WBC: 6.4 K/uL (ref 4.0–10.5)
nRBC: 0 % (ref 0.0–0.2)

## 2024-03-07 LAB — SURGICAL PCR SCREEN
MRSA, PCR: POSITIVE — AB
Staphylococcus aureus: POSITIVE — AB

## 2024-03-07 MED ORDER — MUPIROCIN 2 % EX OINT: TOPICAL_OINTMENT | CUTANEOUS | 0 refills | Status: DC

## 2024-03-07 MED ORDER — OXYCODONE-ACETAMINOPHEN 5-325 MG PO TABS: 1.0000 | ORAL_TABLET | Freq: Four times a day (QID) | ORAL | 0 refills | Status: DC | PRN

## 2024-03-07 NOTE — Patient Instructions (Addendum)
 Your procedure is scheduled on: 03/09/24  Report to the Registration Desk on the 1st floor of the [facility]. To find out your arrival time, please call [phone] between 1PM - 3PM on: 03/08/24 If your arrival time is 6:00 am, do not arrive before that time as the [facility] entrance doors do not open until 6:00 am.  REMEMBER: Instructions that are not followed completely may result in serious medical risk, up to and including death; or upon the discretion of your surgeon and anesthesiologist your surgery may need to be rescheduled.  Do not eat food or drink any liquids after midnight the night before surgery.  No gum chewing or hard candies.  One week prior to surgery: Stop Anti-inflammatories (NSAIDS) such as Advil , Aleve, Ibuprofen , Motrin , Naproxen, Naprosyn and Aspirin  based products such as Excedrin, Goody's Powder, BC Powder. You may take Tylenol  if needed for pain up until the day of surgery.  Stop ANY OVER THE COUNTER supplements until after surgery.  HOLD Eliquis  beginning 03/07/24  ON THE DAY OF SURGERY ONLY TAKE THESE MEDICATIONS WITH SIPS OF WATER:  levothyroxine  (SYNTHROID )  metoprolol  succinate (TOPROL -XL)  pantoprazole  (PROTONIX )   Use inhalers on the day of surgery and bring to the hospital.  No Alcohol for 24 hours before or after surgery.  No Smoking including e-cigarettes for 24 hours before surgery.  No chewable tobacco products for at least 6 hours before surgery.  No nicotine patches on the day of surgery.  Do not use any recreational drugs for at least a week (preferably 2 weeks) before your surgery.  Please be advised that the combination of cocaine and anesthesia may have negative outcomes, up to and including death. If you test positive for cocaine, your surgery will be cancelled.  On the morning of surgery brush your teeth with toothpaste and water, you may rinse your mouth with mouthwash if you wish. Do not swallow any toothpaste or  mouthwash.  Use CHG Soap or wipes as directed on instruction sheet.  Do not wear jewelry, make-up, hairpins, clips or nail polish.  For welded (permanent) jewelry: bracelets, anklets, waist bands, etc.  Please have this removed prior to surgery.  If it is not removed, there is a chance that hospital personnel will need to cut it off on the day of surgery.  Do not wear lotions, powders, or perfumes.   Do not shave body hair from the neck down 48 hours before surgery.  Contact lenses, hearing aids and dentures may not be worn into surgery.  Do not bring valuables to the hospital. [facility] is not responsible for any missing/lost belongings or valuables.   Notify your doctor if there is any change in your medical condition (cold, fever, infection).  Wear comfortable clothing (specific to your surgery type) to the hospital.  After surgery, you can help prevent lung complications by doing breathing exercises.  Take deep breaths and cough every 1-2 hours. Your doctor may order a device called an Incentive Spirometer to help you take deep breaths.  When coughing or sneezing, hold a pillow firmly against your incision with both hands. This is called "splinting." Doing this helps protect your incision. It also decreases belly discomfort.  If you are being admitted to the hospital overnight, leave your suitcase in the car. After surgery it may be brought to your room.  In case of increased patient census, it may be necessary for you, the patient, to continue your postoperative care in the Same Day Surgery  department.  If you are being discharged the day of surgery, you will not be allowed to drive home. You will need a responsible individual to drive you home and stay with you for 24 hours after surgery.   If you are taking public transportation, you will need to have a responsible individual with you.  Please call the Pre-admissions Testing Dept. at [phone] if you have any questions  about these instructions.  Surgery Visitation Policy:  Patients having surgery or a procedure may have two visitors.  Children under the age of 76 must have an adult with them who is not the patient.  Inpatient Visitation:    Visiting hours are 7 a.m. to 8 p.m. Up to four visitors are allowed at one time in a patient room. The visitors may rotate out with other people during the day.  One visitor age 61 or older may stay with the patient overnight and must be in the room by 8 p.m.   Merchandiser, retail to address health-related social needs:  [email or web address]                                                                                                             Preparing for Surgery with CHLORHEXIDINE  GLUCONATE (CHG) Soap  Chlorhexidine  Gluconate (CHG) Soap  o An antiseptic cleaner that kills germs and bonds with the skin to continue killing germs even after washing  o Used for showering the night before surgery and morning of surgery  Before surgery, you can play an important role by reducing the number of germs on your skin.  CHG (Chlorhexidine  gluconate) soap is an antiseptic cleanser which kills germs and bonds with the skin to continue killing germs even after washing.  Please do not use if you have an allergy to CHG or antibacterial soaps. If your skin becomes reddened/irritated stop using the CHG.  1. Shower the NIGHT BEFORE SURGERY and the MORNING OF SURGERY with CHG soap.  2. If you choose to wash your hair, wash your hair first as usual with your normal shampoo.  3. After shampooing, rinse your hair and body thoroughly to remove the shampoo.  4. Use CHG as you would any other liquid soap. You can apply CHG directly to the skin and wash gently with a scrungie or a clean washcloth.  5. Apply the CHG soap to your body only from the neck down. Do not use on open wounds or open sores. Avoid contact with your eyes, ears, mouth, and genitals  (private parts). Wash face and genitals (private parts) with your normal soap.  6. Wash thoroughly, paying special attention to the area where your surgery will be performed.  7. Thoroughly rinse your body with warm water.  8. Do not shower/wash with your normal soap after using and rinsing off the CHG soap.  9. Pat yourself dry with a clean towel.  10. Wear clean pajamas to bed the night before surgery.  12. Place clean sheets on your bed the night of your first shower and do  not sleep with pets.  13. Shower again with the CHG soap on the day of surgery prior to arriving at the hospital.  14. Do not apply any deodorants/lotions/powders.  15. Please wear clean clothes to the hospital.

## 2024-03-07 NOTE — Progress Notes (Addendum)
  Perioperative Services  Abnormal Lab Notification and Treatment Plan of Care  Date: 03/07/24  Name: [name] DOB: 05/30/1961 MRN:   [id number]  Re: Abnormal labs noted during PAT appointment  Provider Notified: Delores Pickles, FNP-C Notification mode: Routed and/or faxed via [facility]  Labs of concern: Lab Results  Component Value Date   STAPHAUREUS POSITIVE (A) 03/07/2024   MRSAPCR POSITIVE (A) 03/07/2024     Notes: Patient is scheduled for a INSERTION, GRAFT, ARTERIOVENOUS, UPPER EXTREMITY on 03/09/2024. She is scheduled to receive CEFAZOLIN  pre-operatively. Pre-surgical PCR (+) for MRSA; see above.  PLANS:  Review renal function. Estimated Creatinine Clearance: 8.7 mL/min (A) (by C-G formula based on SCr of 6.96 mg/dL (H)).  Review allergies. No documented allergy to vancomycin .  Order for VANCOMYCIN  to be added after pharmacy consult. Pharmacy involvement requested as patient has severely reduced renal function and is on hemodialysis on M,W,F.    Patient with orders for both CEFAZOLIN  + VANCOMYCIN  to be given in the setting of documented MRSA (+) surgical PCR.   Guidelines suggest that a beta-lactam antibiotic (first or second generation cephalosporin) should be added for activity against gram-negative organisms.  Vancomycin  appears to be less effective than cefazolin  for preventing SSIs caused by MSSA. For this reason, the use of vancomycin  in combination with cefazolin  is favored for prevention of SSI due to MRSA and coagulase-negative staphylococci.  Medical history in [facility] updated to reflect (+) PCR result indicating nasal MRSA colonization   Rx for additional preoperative nasal decolonization sent to patient's pharmacy of record. We did not have sufficient time between PAT visit for testing and actual procedure, therefore full decolonization protocol unable to be followed. With that said, I will send the mupirocin  for patient to use between now and surgery, which will  allow for some coverage prior to her going to the OR.    Meds ordered this encounter  Medications   mupirocin  ointment (BACTROBAN ) 2 %    Sig: Apply small amount to the inside of both nostrils TWICE a day for the next 5 days.    Dispense:  15 g    Refill:  0    Please contact the patient as soon as it is available for pickup. Rx is for preoperative nasal decolonization following (+) MRSA result on PCR testing. Needs to be started ASAP.   Dorise Pereyra, MSN, APRN, FNP-C, CEN [facility]  Perioperative Services Nurse Practitioner Phone: [phone] 03/07/24 2:18 PM

## 2024-03-08 ENCOUNTER — Other Ambulatory Visit: Payer: Self-pay | Admitting: Internal Medicine

## 2024-03-08 DIAGNOSIS — R188 Other ascites: Secondary | ICD-10-CM

## 2024-03-08 DIAGNOSIS — Z992 Dependence on renal dialysis: Secondary | ICD-10-CM | POA: Diagnosis not present

## 2024-03-08 DIAGNOSIS — N186 End stage renal disease: Secondary | ICD-10-CM | POA: Diagnosis not present

## 2024-03-09 ENCOUNTER — Encounter
Admission: RE | Disposition: A | Discharge status: Home / Self Care | Payer: Self-pay | Source: Home / Self Care | Attending: Vascular Surgery

## 2024-03-09 ENCOUNTER — Encounter: Payer: Self-pay | Admitting: Vascular Surgery

## 2024-03-09 ENCOUNTER — Ambulatory Visit
Admission: RE | Admit: 2024-03-09 | Discharge: 2024-03-09 | Disposition: A | Discharge status: Home / Self Care | Attending: Vascular Surgery | Admitting: Vascular Surgery

## 2024-03-09 ENCOUNTER — Other Ambulatory Visit: Payer: Self-pay

## 2024-03-09 ENCOUNTER — Ambulatory Visit: Admitting: Urgent Care

## 2024-03-09 ENCOUNTER — Encounter: Admitting: Urgent Care

## 2024-03-09 DIAGNOSIS — F17210 Nicotine dependence, cigarettes, uncomplicated: Secondary | ICD-10-CM | POA: Insufficient documentation

## 2024-03-09 DIAGNOSIS — I5022 Chronic systolic (congestive) heart failure: Secondary | ICD-10-CM | POA: Insufficient documentation

## 2024-03-09 DIAGNOSIS — I739 Peripheral vascular disease, unspecified: Secondary | ICD-10-CM | POA: Diagnosis not present

## 2024-03-09 DIAGNOSIS — N186 End stage renal disease: Secondary | ICD-10-CM | POA: Diagnosis not present

## 2024-03-09 DIAGNOSIS — I2510 Atherosclerotic heart disease of native coronary artery without angina pectoris: Secondary | ICD-10-CM | POA: Insufficient documentation

## 2024-03-09 DIAGNOSIS — I132 Hypertensive heart and chronic kidney disease with heart failure and with stage 5 chronic kidney disease, or end stage renal disease: Secondary | ICD-10-CM | POA: Diagnosis not present

## 2024-03-09 DIAGNOSIS — Z8673 Personal history of transient ischemic attack (TIA), and cerebral infarction without residual deficits: Secondary | ICD-10-CM | POA: Diagnosis not present

## 2024-03-09 DIAGNOSIS — T82898A Other specified complication of vascular prosthetic devices, implants and grafts, initial encounter: Secondary | ICD-10-CM | POA: Diagnosis not present

## 2024-03-09 DIAGNOSIS — J449 Chronic obstructive pulmonary disease, unspecified: Secondary | ICD-10-CM | POA: Diagnosis not present

## 2024-03-09 DIAGNOSIS — I252 Old myocardial infarction: Secondary | ICD-10-CM | POA: Diagnosis not present

## 2024-03-09 DIAGNOSIS — Z992 Dependence on renal dialysis: Secondary | ICD-10-CM | POA: Insufficient documentation

## 2024-03-09 DIAGNOSIS — I5042 Chronic combined systolic (congestive) and diastolic (congestive) heart failure: Secondary | ICD-10-CM | POA: Diagnosis not present

## 2024-03-09 DIAGNOSIS — Z79890 Hormone replacement therapy (postmenopausal): Secondary | ICD-10-CM | POA: Diagnosis not present

## 2024-03-09 DIAGNOSIS — D631 Anemia in chronic kidney disease: Secondary | ICD-10-CM | POA: Diagnosis not present

## 2024-03-09 DIAGNOSIS — E039 Hypothyroidism, unspecified: Secondary | ICD-10-CM | POA: Insufficient documentation

## 2024-03-09 HISTORY — PX: INSERTION OF ARTERIOVENOUS (AV) ARTEGRAFT ARM: SHX6779

## 2024-03-09 LAB — POCT I-STAT, CHEM 8
BUN: 67 mg/dL — ABNORMAL HIGH (ref 8–23)
Calcium, Ion: 0.71 mmol/L — CL (ref 1.15–1.40)
Chloride: 104 mmol/L (ref 98–111)
Creatinine, Ser: 6.6 mg/dL — ABNORMAL HIGH (ref 0.44–1.00)
Glucose, Bld: 103 mg/dL — ABNORMAL HIGH (ref 70–99)
HCT: 44 % (ref 36.0–46.0)
Hemoglobin: 15 g/dL (ref 12.0–15.0)
Potassium: 5.6 mmol/L — ABNORMAL HIGH (ref 3.5–5.1)
Sodium: 132 mmol/L — ABNORMAL LOW (ref 135–145)
TCO2: 24 mmol/L (ref 22–32)

## 2024-03-09 LAB — POTASSIUM: Potassium: 3.4 mmol/L — ABNORMAL LOW (ref 3.5–5.1)

## 2024-03-09 SURGERY — INSERTION, GRAFT, ARTERIOVENOUS, UPPER EXTREMITY
Anesthesia: General | Laterality: Right

## 2024-03-09 MED ORDER — PHENYLEPHRINE 80 MCG/ML (10ML) SYRINGE FOR IV PUSH (FOR BLOOD PRESSURE SUPPORT)
PREFILLED_SYRINGE | INTRAVENOUS | Status: DC | PRN
  Administered 2024-03-09 (×3): 80 ug via INTRAVENOUS

## 2024-03-09 MED ORDER — OXIDIZED CELLULOSE EX PADS
MEDICATED_PAD | CUTANEOUS | Status: DC | PRN
  Administered 2024-03-09: 1 via TOPICAL

## 2024-03-09 MED ORDER — CEFAZOLIN SODIUM-DEXTROSE 2-4 GM/100ML-% IV SOLN
2.0000 g | INTRAVENOUS | Status: AC
  Administered 2024-03-09: 2 g via INTRAVENOUS

## 2024-03-09 MED ORDER — PROPOFOL 1000 MG/100ML IV EMUL
INTRAVENOUS | Status: AC
Start: 2024-03-09 — End: 2024-03-09
  Filled 2024-03-09: qty 100

## 2024-03-09 MED ORDER — CHLORHEXIDINE GLUCONATE 0.12 % MT SOLN
15.0000 mL | Freq: Once | OROMUCOSAL | Status: AC
  Administered 2024-03-09: 15 mL via OROMUCOSAL

## 2024-03-09 MED ORDER — PROPOFOL 10 MG/ML IV BOLUS
INTRAVENOUS | Status: AC
  Filled 2024-03-09: qty 20

## 2024-03-09 MED ORDER — CEFAZOLIN SODIUM-DEXTROSE 2-4 GM/100ML-% IV SOLN
INTRAVENOUS | Status: AC
Start: 2024-03-09 — End: 2024-03-09
  Filled 2024-03-09: qty 100

## 2024-03-09 MED ORDER — VANCOMYCIN HCL 1250 MG/250ML IV SOLN
1250.0000 mg | Freq: Once | INTRAVENOUS | Status: DC
  Filled 2024-03-09: qty 250

## 2024-03-09 MED ORDER — PHENYLEPHRINE HCL-NACL 20-0.9 MG/250ML-% IV SOLN
INTRAVENOUS | Status: DC | PRN
  Administered 2024-03-09: 30 ug/min via INTRAVENOUS

## 2024-03-09 MED ORDER — SODIUM CHLORIDE 0.9 % IV SOLN
INTRAVENOUS | Status: DC | PRN
  Administered 2024-03-09: 501 mL via SURGICAL_CAVITY

## 2024-03-09 MED ORDER — CHLORHEXIDINE GLUCONATE 0.12 % MT SOLN
OROMUCOSAL | Status: AC
  Filled 2024-03-09: qty 15

## 2024-03-09 MED ORDER — HEPARIN SODIUM (PORCINE) 5000 UNIT/ML IJ SOLN
INTRAMUSCULAR | Status: AC
  Filled 2024-03-09: qty 1

## 2024-03-09 MED ORDER — PROPOFOL 10 MG/ML IV BOLUS
INTRAVENOUS | Status: DC | PRN
  Administered 2024-03-09: 50 mg via INTRAVENOUS
  Administered 2024-03-09: 120 mg via INTRAVENOUS
  Administered 2024-03-09: 30 mg via INTRAVENOUS

## 2024-03-09 MED ORDER — VANCOMYCIN HCL 1250 MG/250ML IV SOLN
1250.0000 mg | INTRAVENOUS | Status: AC
  Administered 2024-03-09: 1250 mg via INTRAVENOUS
  Filled 2024-03-09: qty 250

## 2024-03-09 MED ORDER — DROPERIDOL 2.5 MG/ML IJ SOLN: 0.6250 mg | Freq: Once | INTRAMUSCULAR | Status: DC | PRN

## 2024-03-09 MED ORDER — SODIUM CHLORIDE 0.9 % IV SOLN: INTRAVENOUS | Status: DC

## 2024-03-09 MED ORDER — FENTANYL CITRATE (PF) 100 MCG/2ML IJ SOLN
INTRAMUSCULAR | Status: DC | PRN
  Administered 2024-03-09 (×4): 25 ug via INTRAVENOUS

## 2024-03-09 MED ORDER — HEPARIN SODIUM (PORCINE) 1000 UNIT/ML IJ SOLN
INTRAMUSCULAR | Status: DC | PRN
Start: 2024-03-09 — End: 2024-03-09
  Administered 2024-03-09: 3000 [IU] via INTRAVENOUS

## 2024-03-09 MED ORDER — VASHE WOUND IRRIGATION OPTIME
TOPICAL | Status: DC | PRN
  Administered 2024-03-09: 4 [oz_av] via TOPICAL

## 2024-03-09 MED ORDER — ONDANSETRON HCL 4 MG/2ML IJ SOLN
INTRAMUSCULAR | Status: DC | PRN
  Administered 2024-03-09: 4 mg via INTRAVENOUS

## 2024-03-09 MED ORDER — FENTANYL CITRATE (PF) 100 MCG/2ML IJ SOLN
INTRAMUSCULAR | Status: AC
  Filled 2024-03-09: qty 2

## 2024-03-09 MED ORDER — ORAL CARE MOUTH RINSE: 15.0000 mL | Freq: Once | OROMUCOSAL | Status: AC

## 2024-03-09 MED ORDER — MIDAZOLAM HCL 2 MG/2ML IJ SOLN
INTRAMUSCULAR | Status: AC
  Filled 2024-03-09: qty 2

## 2024-03-09 MED ORDER — CHLORHEXIDINE GLUCONATE CLOTH 2 % EX PADS
6.0000 | MEDICATED_PAD | Freq: Once | CUTANEOUS | Status: DC
Start: 2024-03-09 — End: 2024-03-09

## 2024-03-09 MED ORDER — CHLORHEXIDINE GLUCONATE CLOTH 2 % EX PADS: 6.0000 | MEDICATED_PAD | Freq: Once | CUTANEOUS | Status: DC

## 2024-03-09 MED ORDER — DEXAMETHASONE SODIUM PHOSPHATE 10 MG/ML IJ SOLN
INTRAMUSCULAR | Status: DC | PRN
  Administered 2024-03-09: 10 mg via INTRAVENOUS

## 2024-03-09 MED ORDER — LIDOCAINE HCL (CARDIAC) PF 100 MG/5ML IV SOSY
PREFILLED_SYRINGE | INTRAVENOUS | Status: DC | PRN
  Administered 2024-03-09: 50 mg via INTRAVENOUS

## 2024-03-09 MED ORDER — FENTANYL CITRATE (PF) 100 MCG/2ML IJ SOLN
25.0000 ug | INTRAMUSCULAR | Status: DC | PRN
  Administered 2024-03-09: 25 ug via INTRAVENOUS

## 2024-03-09 MED ORDER — OXYCODONE-ACETAMINOPHEN 5-325 MG PO TABS: 1.0000 | ORAL_TABLET | Freq: Four times a day (QID) | ORAL | 0 refills | Status: DC | PRN

## 2024-03-09 SURGICAL SUPPLY — 36 items
BAG DECANTER FOR FLEXI CONT (MISCELLANEOUS) ×1 IMPLANT
BLADE SURG SZ11 CARB STEEL (BLADE) ×1 IMPLANT
BRUSH SCRUB EZ 4% CHG (MISCELLANEOUS) ×1 IMPLANT
CHLORAPREP W/TINT 26 (MISCELLANEOUS) ×1 IMPLANT
CLAMP SUTURE YELLOW 5 PAIRS (MISCELLANEOUS) ×1 IMPLANT
CLEANSER WND VASHE 4 (WOUND CARE) ×1 IMPLANT
DERMABOND ADVANCED .7 DNX12 (GAUZE/BANDAGES/DRESSINGS) IMPLANT
DRESSING SURGICEL FIBRLLR 1X2 (HEMOSTASIS) ×1 IMPLANT
ELECT CAUTERY BLADE 6.4 (BLADE) ×1 IMPLANT
ELECTRODE REM PT RTRN 9FT ADLT (ELECTROSURGICAL) ×1 IMPLANT
GLOVE BIO SURGEON STRL SZ7 (GLOVE) ×2 IMPLANT
GOWN STRL REUS W/ TWL LRG LVL3 (GOWN DISPOSABLE) ×2 IMPLANT
GOWN STRL REUS W/TWL 2XL LVL3 (GOWN DISPOSABLE) ×1 IMPLANT
GRAFT PROPATEN STD WALL 6X40 (Vascular Products) IMPLANT
IV NS 500ML BAXH (IV SOLUTION) ×1 IMPLANT
KIT TURNOVER KIT A (KITS) ×1 IMPLANT
LOOP VESSEL MAXI 1X406 RED (MISCELLANEOUS) ×1 IMPLANT
LOOP VESSEL MINI 0.8X406 BLUE (MISCELLANEOUS) ×1 IMPLANT
MANIFOLD NEPTUNE II (INSTRUMENTS) ×1 IMPLANT
PACK EXTREMITY ARMC (MISCELLANEOUS) ×1 IMPLANT
PAD PREP OB/GYN DISP 24X41 (PERSONAL CARE ITEMS) ×1 IMPLANT
PENCIL SMOKE EVACUATOR (MISCELLANEOUS) ×1 IMPLANT
SLING ARM M TX990204 (SOFTGOODS) IMPLANT
SPIKE FLUID TRANSFER (MISCELLANEOUS) ×1 IMPLANT
STOCKINETTE 48X4 2 PLY STRL (GAUZE/BANDAGES/DRESSINGS) ×1 IMPLANT
STOCKINETTE STRL 4IN 9604848 (GAUZE/BANDAGES/DRESSINGS) ×1 IMPLANT
SUT GORETEX 5-0 CV-6/TTC-9 (SUTURE) ×1 IMPLANT
SUT MNCRL AB 4-0 PS2 18 (SUTURE) ×1 IMPLANT
SUT PROLENE 6 0 BV (SUTURE) ×4 IMPLANT
SUT SILK 0 SH 30 (SUTURE) ×1 IMPLANT
SUT SILK 2-0 18XBRD TIE 12 (SUTURE) ×1 IMPLANT
SUT SILK 3-0 18XBRD TIE 12 (SUTURE) ×1 IMPLANT
SUT VIC AB 3-0 SH 27X BRD (SUTURE) ×1 IMPLANT
SYR 20ML LL LF (SYRINGE) ×1 IMPLANT
TRAP FLUID SMOKE EVACUATOR (MISCELLANEOUS) ×1 IMPLANT
WATER STERILE IRR 500ML POUR (IV SOLUTION) ×1 IMPLANT

## 2024-03-09 NOTE — Interval H&P Note (Signed)
 History and Physical Interval Note:  03/09/2024 7:22 AM  [name]  has presented today for surgery, with the diagnosis of ESRD.  The various methods of treatment have been discussed with the patient and family. After consideration of risks, benefits and other options for treatment, the patient has consented to  Procedure(s) with comments: INSERTION, GRAFT, ARTERIOVENOUS, UPPER EXTREMITY (Right) - BRACHIAL AXILLARY as a surgical intervention.  The patient's history has been reviewed, patient examined, no change in status, stable for surgery.  I have reviewed the patient's chart and labs.  Questions were answered to the patient's satisfaction.     [name]

## 2024-03-09 NOTE — Op Note (Signed)
 [facility]  OPERATIVE NOTE   PROCEDURE:  Right upper arm brachial artery to axillary vein arteriovenous graft with 6 mm Gore Propaten PTFE  PRE-OPERATIVE DIAGNOSIS: 1. end stage renal disease  2.  Failing left upper extremity fistula  POST-OPERATIVE DIAGNOSIS: same  SURGEON: Selinda Gu  ASSISTANT(S): None  ANESTHESIA: general  ESTIMATED BLOOD LOSS: 25 cc  SPECIMEN(S):  None  INDICATIONS:   [name] is a 63 y.o. female who presents with end stage renal disease and a failing left arm AV fistula that she has had for over a decade.  Has had multiple previous surgical revisions and interventions, and is now having prolonged bleeding and pain.  She needs a new access on the right arm.  Risk, benefits, and alternatives to access surgery were discussed.  The patient is aware the risks include but are not limited to: bleeding, infection, steal syndrome, nerve damage, ischemic monomelic neuropathy, failure to mature, and need for additional procedures.  The patient is aware of the risks and elects to proceed forward.  DESCRIPTION: After full informed written consent was obtained from the patient, the patient was brought back to the operating room and placed supine upon the operating table.  The patient was given IV antibiotics prior to proceeding.  After obtaining adequate sedation, the patient was prepped and draped in standard fashion for a right arm access procedure.  I turned my attention first to the antecubitum.   I made an incision over the brachial artery, and dissected down through the subcutaneous tissue to the fascia carefully and was able to dissect out the brachial artery.  The artery was about patent and of adequate size to support a graft.  It was controlled proximally and distally with vessel loops and then I turned my attention to the high bicipital groove in the axilla.  I made an incision and dissected down through the subcutaneous tissue and  fascia until I reached the axillary vein.  It was patent and adequate size for graft creation.  I then dissected this vein proximally and distal and prepared it for control with Bulldog clamps.  I took a Company secretary and dissected from the antecubital up to the axillary incision.  Then I delivered the 6 mm Gore Propaten PTFE, through this metal tunneler and then pulled out the metal tunneler leaving the graft in place and making sure the line was up for orientation.  I then gave the patient 3000 units of heparin  to gain some anticoagulation.  After waiting 3 minutes, I placed the brachial artery under tension proximally and distally with vessel loops, made an arteriotomy and extended it with a Potts scissor.  I sewed the graft to this arteriotomy with a running stitch of CV-6 suture.  At this point, then I completed the anastomosis in the usual fashion.  I released the vessel loops on the inflow and allowed the artery to decompress through the graft. There was good pulsatile bleeding through this graft.  I clamped the graft near its arterial anastomosis and sucked out all the blood in the graft and loaded the graft with heparinized saline.  At this point, I pulled the graft to appropriate length and reset my exposure of the axillary vein.  Then, I controlled the vein with Bulldog clamps.  An anterior wall venotomy was created with an 11 blade and extended with Potts scissors.  I then spatulated the graft to facilitate an end-to- side anastomosis matching the arteriotomy.  In the  process of spatulating, I cut the graft to appropriate length for this anastomosis.  This graft was sewn to the vein in an end-to-side configuration with a running CV-6 suture.  Prior to completing this anastomosis, I allowed the vein to back bleed and then I also allowed the artery to bleed in an antegrade fashion.  I completed this anastomosis in the usual fashion, and then irrigated out the high bicipital exposure and then placed  Surgicel and Evicel.  I then turned my attention back to the antecubitum.  There was pulsatile flow in the artery beyond the graft.   At this point, I washed out the antecubital incision. Surgicel and Evicel were then placed. There was no more active bleeding.  The subcutaneous tissue was reapproximated with a running stitch of 3-0 Vicryl.  The skin was then reapproximated with a running subcuticular 4-0 Monocryl.  The skin was then cleaned, dried, and Dermabond used to reinforce the skin closure.  We then turned our attention to the axillary incision.  The subcutaneous tissue was repaired with running stitch of 3-0 Vicryl.  The skin was then reapproximated with running subcuticular 4-0 Monocryl.  The skin was then cleaned, dried, and then the skin closure was reinforced with Dermabond.  The patient was then awakened from anesthesia and taken to the recovery room in stable condition having tolerated the procedure well.    COMPLICATIONS: None  CONDITION: Stable   Selinda Gu 03/09/2024 10:05 AM   This note was created with Dragon Medical transcription system. Any errors in dictation are purely unintentional.

## 2024-03-09 NOTE — Anesthesia Postprocedure Evaluation (Signed)
 Anesthesia Post Note  Patient: Rakeb Kibble Turrell  Procedure(s) Performed: INSERTION, GRAFT, ARTERIOVENOUS, UPPER EXTREMITY (Right)  Patient location during evaluation: PACU Anesthesia Type: General Level of consciousness: awake and alert Pain management: pain level controlled Vital Signs Assessment: post-procedure vital signs reviewed and stable Respiratory status: spontaneous breathing, nonlabored ventilation and respiratory function stable Cardiovascular status: blood pressure returned to baseline, tachycardic and stable Postop Assessment: no apparent nausea or vomiting Anesthetic complications: no   No notable events documented.   Last Vitals:  Vitals:   03/09/24 1030 03/09/24 1105  BP: (!) 99/49 (!) 133/105  Pulse: (!) 108 (!) 105  Resp: 10 16  Temp:    SpO2: 90% 93%    Last Pain:  Vitals:   03/09/24 1105  PainSc: 6                  Jazlene Bares Merilee Louder

## 2024-03-09 NOTE — Anesthesia Preprocedure Evaluation (Signed)
 Anesthesia Evaluation  Patient identified by MRN, date of birth, ID band Patient awake    Reviewed: Allergy & Precautions, NPO status , Patient's Chart, lab work & pertinent test results  History of Anesthesia Complications Negative for: history of anesthetic complications  Airway Mallampati: III  TM Distance: <3 FB Neck ROM: full    Dental  (+) Chipped, Poor Dentition, Missing, Dental Advidsory Given   Pulmonary shortness of breath and with exertion, neg sleep apnea, COPD, neg recent URI, Current Smoker and Patient abstained from smoking.   Pulmonary exam normal        Cardiovascular hypertension, (-) angina + CAD, + Past MI, + CABG, + Peripheral Vascular Disease and +CHF  (-) Cardiac Stents Normal cardiovascular exam+ dysrhythmias (-) Valvular Problems/Murmurs  ECHO 05/17/23: 1. Left ventricular ejection fraction, by estimation, is 25 to 30%. The left ventricle has severely decreased function. The left ventricle demonstrates global hypokinesis. The left ventricular internal cavity size was severely dilated. There is moderate  left ventricular hypertrophy. Left ventricular diastolic parameters are consistent with Grade II diastolic dysfunction (pseudonormalization).   2. Right ventricular systolic function is severely reduced. The right ventricular size is severely enlarged. Mildly increased right ventricular wall thickness. There is severely elevated pulmonary artery systolic pressure.   3. Left atrial size was severely dilated.   4. Right atrial size was mildly dilated.   5. The mitral valve is grossly normal. Mild to moderate mitral valve regurgitation.   6. Tricuspid valve regurgitation is moderate.   7. The aortic valve is calcified. Aortic valve regurgitation is not visualized.     Neuro/Psych  Headaches PSYCHIATRIC DISORDERS      CVA, Residual Symptoms    GI/Hepatic ,GERD  Controlled,,(+) Hepatitis -  Endo/Other   Hypothyroidism    Renal/GU Renal disease  negative genitourinary   Musculoskeletal   Abdominal   Peds  Hematology negative hematology ROS (+)   Anesthesia Other Findings Past Medical History: 03/03/2014: Acute lacunar infarction [facility])     Comment:  a.) MRI brain 03/03/2014: two acute punctate lacunar               infarcts anteriorly in the BILATERAL centrum semiovale No date: Adult behavior problems No date: Anemia of chronic renal failure No date: Aortic atherosclerosis (HCC) No date: Atrial fibrillation and flutter (HCC)     Comment:  a.) CHA2DS2VASc = 6 (sex, HFrEF, HTN, CVA x 2, prior               MI/vascular disease) as of 02/04/2024; b.) rate/rhythm               maintained on oral metoprolol  succinate; chronically               anticoagulated with apixaban  No date: Bipolar disorder (HCC) No date: Cardiomegaly No date: Cerebral microvascular disease No date: Chicken pox No date: Cholelithiasis 12/19/2021: Chronic respiratory failure with hypoxia (HCC) No date: COPD (chronic obstructive pulmonary disease) (HCC) No date: Coronary artery disease No date: DDD (degenerative disc disease), cervical No date: DDD (degenerative disc disease), thoracolumbar No date: Diverticulosis No date: Dyspnea No date: ESRD (end stage renal disease) on dialysis (M,W,F) No date: GERD (gastroesophageal reflux disease) No date: HCV (hepatitis C virus) No date: HFrEF (heart failure with reduced ejection fraction) (HCC) 04/12/2014: History of delirium No date: History of substance abuse (HCC)     Comment:  a.) tobacco + cocaine 11/13/2014: Hyperkalemia No date: Hyperlipidemia No date: Hypertension 02/24/2014: Hypotension No date: Hypothyroidism  No date: Ischemic cardiomyopathy 02/01/2024: Nose colonized with MRSA     Comment:  a.) presurgical PCR (+) on 02/01/2024 prior to A/V               FISTULAGRAM 10/08/2013: NSTEMI (non-ST elevated myocardial infarction) (HCC) 01/23/2002:  NSTEMI (non-ST elevated myocardial infarction) [facility])     Comment:  a.) LHC 01/24/2002 --> multi-vessel CAD with IRA being               90% OM2 --> delayed PCI until 01/25/2002 --> 3.0 x 23 mm               BX Velocity stent No date: Obesity No date: 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 No date: On apixaban  therapy No date: Peripheral vascular disease (HCC) 02/27/2014: Postoperative anemia due to acute blood loss 02/21/2014: Postoperative cerebrovascular infarction following  cardiac surgery (HCC)     Comment:  a.) developed RIGHT upper/lower extremity weakness               following cardiac revascularization --> neuro consult and              head CT --> Hypodensity noted in the area of the LEFT               motor cortex consistent with a component of acute               ischemia No date: Pulmonary hypertension (HCC) No date: RBBB (right bundle branch block) No date: Renal osteodystrophy 02/20/2014: S/P CABG x 3     Comment:  a.) LIMA-LAD, SVG-OM1, SVG-PDA 05/23/2010: Stroke (HCC)     Comment:  a.) brain MRI 05/23/2010: BILATERAL acute infarcts and               old prior infarcts; RIGHT frontal lobe extending into the              white matter. Small focus of restricted diffusion in the               cortex of the medial LEFT frontoparietal lobe.               Periventricular T2 and multiple foci of T2 hyperintensity              in the subcortical white matter and increased FLAIR               signal in the cortex of the LEFT frontal lobe. 07/25/2020: Syncope and collapse  Past Surgical History: 03/30/2019: A/V FISTULAGRAM; Left     Comment:  Procedure: A/V FISTULAGRAM;  Surgeon: Marea Selinda RAMAN, MD;               Location: [facility];  Service: Cardiovascular;              Laterality: Left; 10/06/2019: A/V FISTULAGRAM; Left     Comment:  Procedure: A/V FISTULAGRAM;  Surgeon: Marea Selinda RAMAN, MD;               Location: [facility];  Service:  Cardiovascular;              Laterality: Left; 05/19/2021: A/V FISTULAGRAM; Left     Comment:  Procedure: A/V FISTULAGRAM;  Surgeon: Marea Selinda RAMAN, MD;               Location: [facility];  Service: Cardiovascular;  Laterality: Left; 01/29/2022: A/V FISTULAGRAM; Left     Comment:  Procedure: A/V Fistulagram;  Surgeon: Marea Selinda RAMAN, MD;               Location: [facility];  Service: Cardiovascular;              Laterality: Left; 04/30/2022: A/V FISTULAGRAM; Left     Comment:  Procedure: A/V Fistulagram;  Surgeon: Marea Selinda RAMAN, MD;               Location: [facility];  Service: Cardiovascular;              Laterality: Left; 08/20/2022: A/V FISTULAGRAM; Left     Comment:  Procedure: A/V Fistulagram;  Surgeon: Marea Selinda RAMAN, MD;               Location: [facility];  Service: Cardiovascular;              Laterality: Left; 12/03/2022: A/V FISTULAGRAM; N/A     Comment:  Procedure: A/V Fistulagram;  Surgeon: Marea Selinda RAMAN, MD;               Location: [facility];  Service: Cardiovascular;              Laterality: N/A; 02/11/2023: A/V FISTULAGRAM; Left     Comment:  Procedure: A/V Fistulagram;  Surgeon: Marea Selinda RAMAN, MD;               Location: [facility];  Service: Cardiovascular;              Laterality: Left; No date: COLONOSCOPY 04/22/2021: COLONOSCOPY WITH PROPOFOL ; N/A     Comment:  Procedure: COLONOSCOPY WITH PROPOFOL ;  Surgeon:               Maryruth Ole DASEN, MD;  Location: [facility];                Service: Endoscopy;  Laterality: N/A; 01/25/2002: CORONARY ANGIOPLASTY WITH STENT PLACEMENT; Left     Comment:  Procedure: CORONARY ANGIOPLASTY WITH STENT PLACEMENT;               Location: [facility]; Surgeon: Margie Lovelace, MD 02/20/2014: CORONARY ARTERY BYPASS GRAFT; N/A     Comment:  Procedure: CORONARY ARTERY BYPASS GRAFT; Location: [facility];              Surgeon: Reyes Fruits, MD No date: DIALYSIS FISTULA  CREATION 04/08/2023: DIALYSIS/PERMA CATHETER INSERTION; N/A     Comment:  Procedure: DIALYSIS/PERMA CATHETER INSERTION;  Surgeon:               Marea Selinda RAMAN, MD;  Location: [facility];                Service: Cardiovascular;  Laterality: N/A; 07/06/2023: DIALYSIS/PERMA CATHETER REMOVAL; N/A     Comment:  Procedure: DIALYSIS/PERMA CATHETER REMOVAL;  Surgeon:               [name] Cordella MATSU, MD;  Location: [facility];               Service: Cardiovascular;  Laterality: N/A; No date: ESOPHAGOGASTRODUODENOSCOPY 04/22/2021: ESOPHAGOGASTRODUODENOSCOPY; N/A     Comment:  Procedure: ESOPHAGOGASTRODUODENOSCOPY (EGD);  Surgeon:               Maryruth Ole DASEN, MD;  Location: [facility];  Service: Endoscopy;  Laterality: N/A; 02/23/2019: FLEXIBLE SIGMOIDOSCOPY; N/A     Comment:  Procedure: FLEXIBLE SIGMOIDOSCOPY;  Surgeon: Gaylyn Gladis PENNER, MD;  Location: [facility];  Service:               Endoscopy;  Laterality: N/A; 02/20/2014: IABP INSERTION     Comment:  Procedure: INTRA-AORTIC BALLOON PUMP INSERTION;               Location: [facility]; Surgeon: Reyes Fruits, MD 01/24/2002: LEFT HEART CATH AND CORONARY ANGIOGRAPHY; Left     Comment:  Procedure: LEFT HEART CATH AND CORONARY ANGIOGRAPHY;               Location: [facility]; Surgeon: Margie Lovelace, MD 05/25/2012: LEFT HEART CATH AND CORONARY ANGIOGRAPHY; Left     Comment:  Procedure: LEFT HEART CATH AND CORONARY ANGIOGRAPHY;               Location: [facility]; Surgeon: Margie Lovelace, MD 10/09/2013: LEFT HEART CATH AND CORONARY ANGIOGRAPHY; Left     Comment:  Procedure: LEFT HEART CATH AND CORONARY ANGIOGRAPHY;               Location: [facility]; Surgeon: Vinie Jude, MD 08/08/2018: LEFT HEART CATH AND CORS/GRAFTS ANGIOGRAPHY; N/A     Comment:  Procedure: LEFT HEART CATH AND CORS/GRAFTS ANGIOGRAPHY;               Surgeon: Fernand Denyse LABOR, MD;  Location: [facility];  Service:  Cardiovascular;  Laterality: N/A; 01/30/2019: LEFT HEART CATH AND CORS/GRAFTS ANGIOGRAPHY; N/A     Comment:  Procedure: LEFT HEART CATH AND CORS/GRAFTS ANGIOGRAPHY;               Surgeon: Fernand Denyse LABOR, MD;  Location: [facility];  Service: Cardiovascular;  Laterality: N/A; No date: ultrasound guided pericardiocentesis     Reproductive/Obstetrics negative OB ROS                              Anesthesia Physical Anesthesia Plan  ASA: 4  Anesthesia Plan: General   Post-op Pain Management:    Induction: Intravenous  PONV Risk Score and Plan:   Airway Management Planned: LMA  Additional Equipment:   Intra-op Plan:   Post-operative Plan: Extubation in OR  Informed Consent: I have reviewed the patients History and Physical, chart, labs and discussed the procedure including the risks, benefits and alternatives for the proposed anesthesia with the patient or authorized representative who has indicated his/her understanding and acceptance.     Dental Advisory Given  Plan Discussed with: Anesthesiologist, CRNA and Surgeon  Anesthesia Plan Comments: (Patient consented for risks of anesthesia including but not limited to:  - adverse reactions to medications - risk of airway placement if required - damage to eyes, teeth, lips or other oral mucosa - nerve damage due to positioning  - sore throat or hoarseness - Damage to heart, brain, nerves, lungs, other parts of body or loss of life  Patient voiced understanding and assent.)         Anesthesia Quick Evaluation

## 2024-03-09 NOTE — Transfer of Care (Signed)
 Immediate Anesthesia Transfer of Care Note  Patient: [name]  Procedure(s) Performed: INSERTION, GRAFT, ARTERIOVENOUS, UPPER EXTREMITY (Right)  Patient Location: PACU  Anesthesia Type:General  Level of Consciousness: drowsy  Airway & Oxygen  Therapy: Patient Spontanous Breathing and Patient connected to face mask oxygen   Post-op Assessment: Report given to RN and Post -op Vital signs reviewed and stable  Post vital signs: Reviewed and stable  Last Vitals:  Vitals Value Taken Time  BP 107/78 03/09/24 09:54  Temp    Pulse 29 03/09/24 09:58  Resp 24 03/09/24 09:59  SpO2 97 % 03/09/24 09:58  Vitals shown include unfiled device data.  Last Pain:  Vitals:   03/09/24 0613  PainSc: 5          Complications: No notable events documented.

## 2024-03-09 NOTE — Progress Notes (Signed)
 OK to place IV in left hand per Dr Marea.

## 2024-03-09 NOTE — Progress Notes (Signed)
 Pharmacy Antibiotic Note  [name] is a 63 y.o. female admitted on (Not on file) with surgical prophylaxis.  Pharmacy has been consulted for Vancomycin  dosing.  Plan: Vancomycin  1250 mg (15 mg/kg) IV X 1 ordered for 9/25 ,  60 min pre-op       No data recorded.  Recent Labs  Lab 03/07/24 1111  WBC 6.4  CREATININE 6.96*    Estimated Creatinine Clearance: 8.7 mL/min (A) (by C-G formula based on SCr of 6.96 mg/dL (H)).    Allergies  Allergen Reactions   Morphine  And Codeine Hives   Levaquin [Levofloxacin] Itching and Other (See Comments)    Severe itching; prickly sensation; severe left leg pain; immobility.   Enalapril  Other (See Comments)    hallucinations   Levothyroxine      Immense pain in left hip area    Latex Rash   Tape Rash and Other (See Comments)    Antimicrobials this admission:   >>    >>   Dose adjustments this admission:   Microbiology results:  BCx:   UCx:    Sputum:    MRSA PCR:   Thank you for allowing pharmacy to be a part of this patient's care.  Wrigley Winborne D 03/09/2024 1:15 AM

## 2024-03-09 NOTE — Anesthesia Procedure Notes (Signed)
 Procedure Name: LMA Insertion Date/Time: 03/09/2024 8:02 AM  Performed by: Aletta Edmunds, CRNAPre-anesthesia Checklist: Patient identified, Emergency Drugs available, Suction available and Patient being monitored Patient Re-evaluated:Patient Re-evaluated prior to induction Oxygen  Delivery Method: Circle System Utilized Preoxygenation: Pre-oxygenation with 100% oxygen  Induction Type: IV induction Ventilation: Mask ventilation without difficulty LMA: LMA inserted LMA Size: 4.0 Number of attempts: 1 Airway Equipment and Method: Bite block Placement Confirmation: positive ETCO2 Tube secured with: Tape Dental Injury: Teeth and Oropharynx as per pre-operative assessment

## 2024-03-10 ENCOUNTER — Encounter: Payer: Self-pay | Admitting: Vascular Surgery

## 2024-03-10 DIAGNOSIS — Z992 Dependence on renal dialysis: Secondary | ICD-10-CM | POA: Diagnosis not present

## 2024-03-10 DIAGNOSIS — N2581 Secondary hyperparathyroidism of renal origin: Secondary | ICD-10-CM | POA: Diagnosis not present

## 2024-03-10 DIAGNOSIS — E8779 Other fluid overload: Secondary | ICD-10-CM | POA: Diagnosis not present

## 2024-03-13 DIAGNOSIS — E8779 Other fluid overload: Secondary | ICD-10-CM | POA: Diagnosis not present

## 2024-03-13 DIAGNOSIS — N2581 Secondary hyperparathyroidism of renal origin: Secondary | ICD-10-CM | POA: Diagnosis not present

## 2024-03-13 DIAGNOSIS — Z992 Dependence on renal dialysis: Secondary | ICD-10-CM | POA: Diagnosis not present

## 2024-03-13 LAB — TYPE AND SCREEN
ABO/RH(D): O POS
Antibody Screen: NEGATIVE
Unit division: 0
Unit division: 0

## 2024-03-13 LAB — BPAM RBC
Blood Product Expiration Date: 202510182359
Blood Product Expiration Date: 202510182359
Unit Type and Rh: 5100
Unit Type and Rh: 5100

## 2024-03-14 ENCOUNTER — Other Ambulatory Visit: Payer: Self-pay | Admitting: Internal Medicine

## 2024-03-14 ENCOUNTER — Ambulatory Visit
Admission: RE | Admit: 2024-03-14 | Discharge: 2024-03-14 | Disposition: A | Discharge status: Home / Self Care | Source: Ambulatory Visit | Attending: Internal Medicine | Admitting: Internal Medicine

## 2024-03-14 DIAGNOSIS — R188 Other ascites: Secondary | ICD-10-CM | POA: Insufficient documentation

## 2024-03-14 DIAGNOSIS — N186 End stage renal disease: Secondary | ICD-10-CM | POA: Diagnosis not present

## 2024-03-14 DIAGNOSIS — Z992 Dependence on renal dialysis: Secondary | ICD-10-CM | POA: Diagnosis not present

## 2024-03-14 NOTE — Procedures (Signed)
 Patient presents for therapeutic paracentesis. I was present during limited US  of the abdomen to evaluate for ascites. There is minimal peritoneal fluid with numerous mobile loops of bowel. Insufficient pocket of fluid to safely perform a paracentesis.   After discussion of the risks versus the benefits of the procedure the patient elected to defer the paracentesis at this time.  Procedure not performed.  Electronically Signed: Jaylaa Gallion M Hiram Mciver, PA-C 03/14/2024, 11:04 AM

## 2024-03-15 DIAGNOSIS — F1910 Other psychoactive substance abuse, uncomplicated: Secondary | ICD-10-CM | POA: Insufficient documentation

## 2024-03-15 DIAGNOSIS — F3170 Bipolar disorder, currently in remission, most recent episode unspecified: Secondary | ICD-10-CM | POA: Insufficient documentation

## 2024-03-15 DIAGNOSIS — G894 Chronic pain syndrome: Secondary | ICD-10-CM | POA: Insufficient documentation

## 2024-03-15 NOTE — Progress Notes (Signed)
 Established Patient Office Visit  Subjective:  Patient ID: [name], female    DOB: Aug 17, 1960  Age: 63 y.o. MRN: [id number]  Chief Complaint  Patient presents with   Follow-up    2 month follow up    Patient is here today for her 2 months follow up.  She has been feeling fairly well since last appointment.   She does have additional concerns to discuss today.  She has a procedure later this week to help with her dialysis access, as the current fistula is going bad.  She also reports that she has been continuing to have severe pain in her back and legs, asks if we can send a refill of her meds until we can get her set up with a pain clinic.   Labs are not due today.  She needs refills.   I have reviewed her active problem list, medication list, allergies, notes from last encounter, lab results for her appointment today.      No other concerns at this time.   Past Medical History:  Diagnosis Date   Acute lacunar infarction (HCC) 03/03/2014   a.) MRI brain 03/03/2014: two acute punctate lacunar infarcts anteriorly in the BILATERAL centrum semiovale   Adult behavior problems    Anemia of chronic renal failure    Aortic atherosclerosis    Arthritis    Atrial fibrillation and flutter (HCC)    a.) CHA2DS2VASc = 6 (sex, HFrEF, HTN, CVA x 2, prior MI/vascular disease) as of 02/04/2024; b.) rate/rhythm maintained on oral metoprolol  succinate; chronically anticoagulated with apixaban    Bipolar disorder (HCC)    Cardiomegaly    Cerebral microvascular disease    CHF (congestive heart failure) (HCC)    Chicken pox    Cholelithiasis    Chronic respiratory failure with hypoxia (HCC) 12/19/2021   COPD (chronic obstructive pulmonary disease) (HCC)    Coronary artery disease    DDD (degenerative disc disease), cervical    DDD (degenerative disc disease), thoracolumbar    Diverticulosis    Dyspnea    ESRD (end stage renal disease) on dialysis (M,W,F)    GERD  (gastroesophageal reflux disease)    HCV (hepatitis C virus)    Headache    HFrEF (heart failure with reduced ejection fraction) (HCC)    History of delirium 04/12/2014   History of substance abuse (HCC)    a.) tobacco + cocaine   Hyperkalemia 11/13/2014   Hyperlipidemia    Hypertension    Hypotension 02/24/2014   Hypothyroidism    Ischemic cardiomyopathy    Nose colonized with MRSA 02/01/2024   a.) presurgical PCR (+) on 02/01/2024 prior to A/V FISTULAGRAM; b.) presurgical PCR (+) on 03/07/2024 prior to AV FISTULA CREATION   NSTEMI (non-ST elevated myocardial infarction) (HCC) 10/08/2013   NSTEMI (non-ST elevated myocardial infarction) (HCC) 01/23/2002   a.) LHC 01/24/2002 --> multi-vessel CAD with IRA being 90% OM2 --> delayed PCI until 01/25/2002 --> 3.0 x 23 mm BX Velocity stent   Obesity    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    On apixaban  therapy    Peripheral vascular disease    Pneumonia    Postoperative anemia due to acute blood loss 02/27/2014   Postoperative cerebrovascular infarction following cardiac surgery 02/21/2014   a.) developed RIGHT upper/lower extremity weakness following cardiac revascularization --> neuro consult and head CT --> Hypodensity noted in the area of the LEFT motor cortex consistent with a component of acute ischemia   Pulmonary hypertension (  HCC)    RBBB (right bundle branch block)    Renal osteodystrophy    S/P CABG x 3 02/20/2014   a.) LIMA-LAD, SVG-OM1, SVG-PDA   Stroke (HCC) 05/23/2010   a.) brain MRI 05/23/2010: BILATERAL acute infarcts and old prior infarcts; RIGHT frontal lobe extending into the white matter. Small focus of restricted diffusion in the cortex of the medial LEFT frontoparietal lobe. Periventricular T2 and multiple foci of T2 hyperintensity in the subcortical white matter and increased FLAIR signal in the cortex of the LEFT frontal lobe.   Syncope and collapse 07/25/2020    Past Surgical History:  Procedure  Laterality Date   A/V FISTULAGRAM Left 03/30/2019   Procedure: A/V FISTULAGRAM;  Surgeon: Marea Selinda RAMAN, MD;  Location: [facility];  Service: Cardiovascular;  Laterality: Left;   A/V FISTULAGRAM Left 10/06/2019   Procedure: A/V FISTULAGRAM;  Surgeon: Marea Selinda RAMAN, MD;  Location: [facility];  Service: Cardiovascular;  Laterality: Left;   A/V FISTULAGRAM Left 05/19/2021   Procedure: A/V FISTULAGRAM;  Surgeon: Marea Selinda RAMAN, MD;  Location: [facility];  Service: Cardiovascular;  Laterality: Left;   A/V FISTULAGRAM Left 01/29/2022   Procedure: A/V Fistulagram;  Surgeon: Marea Selinda RAMAN, MD;  Location: [facility];  Service: Cardiovascular;  Laterality: Left;   A/V FISTULAGRAM Left 04/30/2022   Procedure: A/V Fistulagram;  Surgeon: Marea Selinda RAMAN, MD;  Location: [facility];  Service: Cardiovascular;  Laterality: Left;   A/V FISTULAGRAM Left 08/20/2022   Procedure: A/V Fistulagram;  Surgeon: Marea Selinda RAMAN, MD;  Location: [facility];  Service: Cardiovascular;  Laterality: Left;   A/V FISTULAGRAM N/A 12/03/2022   Procedure: A/V Fistulagram;  Surgeon: Marea Selinda RAMAN, MD;  Location: [facility];  Service: Cardiovascular;  Laterality: N/A;   A/V FISTULAGRAM Left 02/11/2023   Procedure: A/V Fistulagram;  Surgeon: Marea Selinda RAMAN, MD;  Location: [facility];  Service: Cardiovascular;  Laterality: Left;   A/V FISTULAGRAM Left 02/07/2024   Procedure: A/V Fistulagram;  Surgeon: Marea Selinda RAMAN, MD;  Location: [facility];  Service: Cardiovascular;  Laterality: Left;   COLONOSCOPY     COLONOSCOPY WITH PROPOFOL  N/A 04/22/2021   Procedure: COLONOSCOPY WITH PROPOFOL ;  Surgeon: Maryruth Ole DASEN, MD;  Location: [facility];  Service: Endoscopy;  Laterality: N/A;   CORONARY ANGIOPLASTY WITH STENT PLACEMENT Left 01/25/2002   Procedure: CORONARY ANGIOPLASTY WITH STENT PLACEMENT; Location: [facility]; Surgeon: Margie Lovelace, MD   CORONARY ARTERY  BYPASS GRAFT N/A 02/20/2014   Procedure: CORONARY ARTERY BYPASS GRAFT; Location: [facility]; Surgeon: Reyes Fruits, MD   DIALYSIS FISTULA CREATION     DIALYSIS/PERMA CATHETER INSERTION N/A 04/08/2023   Procedure: DIALYSIS/PERMA CATHETER INSERTION;  Surgeon: Marea Selinda RAMAN, MD;  Location: [facility];  Service: Cardiovascular;  Laterality: N/A;   DIALYSIS/PERMA CATHETER REMOVAL N/A 07/06/2023   Procedure: DIALYSIS/PERMA CATHETER REMOVAL;  Surgeon: [name] Cordella MATSU, MD;  Location: [facility];  Service: Cardiovascular;  Laterality: N/A;   ESOPHAGOGASTRODUODENOSCOPY     ESOPHAGOGASTRODUODENOSCOPY N/A 04/22/2021   Procedure: ESOPHAGOGASTRODUODENOSCOPY (EGD);  Surgeon: Maryruth Ole DASEN, MD;  Location: [facility];  Service: Endoscopy;  Laterality: N/A;   FLEXIBLE SIGMOIDOSCOPY N/A 02/23/2019   Procedure: FLEXIBLE SIGMOIDOSCOPY;  Surgeon: Gaylyn Gladis PENNER, MD;  Location: [facility] ENDOSCOPY;  Service: Endoscopy;  Laterality: N/A;   IABP INSERTION  02/20/2014   Procedure: INTRA-AORTIC BALLOON PUMP INSERTION; Location: [facility]; Surgeon: Reyes Fruits, MD   INSERTION OF  ARTERIOVENOUS (AV) ARTEGRAFT ARM Right 03/09/2024   Procedure: INSERTION, GRAFT, ARTERIOVENOUS, UPPER EXTREMITY;  Surgeon: Marea Selinda RAMAN, MD;  Location: [facility];  Service: Vascular;  Laterality: Right;  BRACHIAL AXILLARY   LEFT HEART CATH AND CORONARY ANGIOGRAPHY Left 01/24/2002   Procedure: LEFT HEART CATH AND CORONARY ANGIOGRAPHY; Location: [facility]; Surgeon: Margie Lovelace, MD   LEFT HEART CATH AND CORONARY ANGIOGRAPHY Left 05/25/2012   Procedure: LEFT HEART CATH AND CORONARY ANGIOGRAPHY; Location: [facility]; Surgeon: Margie Lovelace, MD   LEFT HEART CATH AND CORONARY ANGIOGRAPHY Left 10/09/2013   Procedure: LEFT HEART CATH AND CORONARY ANGIOGRAPHY; Location: [facility]; Surgeon: Vinie Jude, MD   LEFT HEART CATH AND CORS/GRAFTS ANGIOGRAPHY N/A 08/08/2018   Procedure: LEFT HEART CATH AND CORS/GRAFTS ANGIOGRAPHY;  Surgeon: Fernand Denyse LABOR, MD;  Location: [facility];  Service: Cardiovascular;  Laterality: N/A;   LEFT HEART CATH AND CORS/GRAFTS ANGIOGRAPHY N/A 01/30/2019   Procedure: LEFT HEART CATH AND CORS/GRAFTS ANGIOGRAPHY;  Surgeon: Fernand Denyse LABOR, MD;  Location: [facility];  Service: Cardiovascular;  Laterality: N/A;   ultrasound guided pericardiocentesis      Social History   Socioeconomic History   Marital status: Single    Spouse name: Not on file   Number of children: Not on file   Years of education: Not on file   Highest education level: Not on file  Occupational History   Not on file  Tobacco Use   Smoking status: Every Day    Current packs/day: 0.25    Average packs/day: 0.3 packs/day for 20.0 years (5.0 ttl pk-yrs)    Types: Cigarettes   Smokeless tobacco: Never   Tobacco comments:    smokes 1 - 1.5 cigarettes a day  Vaping Use   Vaping status: Never Used  Substance and Sexual Activity   Alcohol use: No    Comment: social   Drug use: No   Sexual activity: Not Currently    Birth control/protection: Post-menopausal  Other Topics Concern   Not on file  Social History Narrative   Lives alone   Social Drivers of Health   Financial Resource Strain: Not on file  Food Insecurity: No Food Insecurity (05/18/2023)   Hunger Vital Sign    Worried About Running Out of Food in the Last Year: Never true    Ran Out of Food in the Last Year: Never true  Transportation Needs: No Transportation Needs (05/18/2023)   PRAPARE - Administrator, Civil Service (Medical): No    Lack of Transportation (Non-Medical): No  Physical Activity: Not on file  Stress: Not on file  Social Connections: Not on file  Intimate Partner Violence: Not At Risk (05/18/2023)   Humiliation, Afraid, Rape, and Kick questionnaire    Fear of Current or Ex-Partner: No    Emotionally Abused: No    Physically Abused: No    Sexually Abused: No    Family History  Problem Relation Age of Onset    Hypertension Mother    Ovarian cancer Mother    Diabetes type II Mother    Hypertension Father    Kidney disease Father    Hypertension Sister    Diabetes type II Maternal Grandmother    Breast cancer Maternal Grandmother    Breast cancer Maternal Aunt    Hypertension Other    Cancer Other    Renal Disease Other     Allergies  Allergen Reactions   Morphine  And Codeine Hives   Levaquin [Levofloxacin] Itching and Other (  See Comments)    Severe itching; prickly sensation; severe left leg pain; immobility.   Enalapril  Other (See Comments)    hallucinations   Levothyroxine      Immense pain in left hip area    Latex Rash   Tape Rash and Other (See Comments)    Review of Systems  Musculoskeletal:  Positive for back pain and joint pain.  All other systems reviewed and are negative.      Objective:   BP 104/76   Ht 5' 6 (1.676 m)   Wt 158 lb (71.7 kg)   BMI 25.50 kg/m   Vitals:   03/07/24 0928  BP: 104/76  Height: 5' 6 (1.676 m)  Weight: 158 lb (71.7 kg)  BMI (Calculated): 25.51    Physical Exam Vitals and nursing note reviewed.  Constitutional:      Appearance: Normal appearance. She is normal weight.  HENT:     Head: Normocephalic.  Eyes:     Extraocular Movements: Extraocular movements intact.     Conjunctiva/sclera: Conjunctivae normal.     Pupils: Pupils are equal, round, and reactive to light.  Cardiovascular:     Rate and Rhythm: Normal rate.  Pulmonary:     Effort: Pulmonary effort is normal.  Abdominal:     General: There is distension.  Neurological:     General: No focal deficit present.     Mental Status: She is alert and oriented to person, place, and time. Mental status is at baseline.  Psychiatric:        Mood and Affect: Mood normal.        Behavior: Behavior normal.        Thought Content: Thought content normal.        Judgment: Judgment normal.      Results for orders placed or performed during the hospital encounter of  03/07/24  Surgical pcr screen   Specimen: Nasal Mucosa; Nasal Swab  Result Value Ref Range   MRSA, PCR POSITIVE (A) NEGATIVE   Staphylococcus aureus POSITIVE (A) NEGATIVE  Basic metabolic panel  Result Value Ref Range   Sodium 139 135 - 145 mmol/L   Potassium 4.0 3.5 - 5.1 mmol/L   Chloride 97 (L) 98 - 111 mmol/L   CO2 24 22 - 32 mmol/L   Glucose, Bld 88 70 - 99 mg/dL   BUN 52 (H) 8 - 23 mg/dL   Creatinine, Ser 3.03 (H) 0.44 - 1.00 mg/dL   Calcium  8.7 (L) 8.9 - 10.3 mg/dL   GFR, Estimated 6 (L) >60 mL/min   Anion gap 18 (H) 5 - 15  CBC with Differential/Platelet  Result Value Ref Range   WBC 6.4 4.0 - 10.5 K/uL   RBC 4.52 3.87 - 5.11 MIL/uL   Hemoglobin 14.3 12.0 - 15.0 g/dL   HCT 55.0 63.9 - 53.9 %   MCV 99.3 80.0 - 100.0 fL   MCH 31.6 26.0 - 34.0 pg   MCHC 31.8 30.0 - 36.0 g/dL   RDW 84.2 (H) 88.4 - 84.4 %   Platelets 146 (L) 150 - 400 K/uL   nRBC 0.0 0.0 - 0.2 %   Neutrophils Relative % 67 %   Neutro Abs 4.3 1.7 - 7.7 K/uL   Lymphocytes Relative 19 %   Lymphs Abs 1.2 0.7 - 4.0 K/uL   Monocytes Relative 12 %   Monocytes Absolute 0.8 0.1 - 1.0 K/uL   Eosinophils Relative 1 %   Eosinophils Absolute 0.0 0.0 - 0.5 K/uL  Basophils Relative 0 %   Basophils Absolute 0.0 0.0 - 0.1 K/uL   Immature Granulocytes 1 %   Abs Immature Granulocytes 0.03 0.00 - 0.07 K/uL  Type and screen [facility]  Result Value Ref Range   ABO/RH(D) O POS    Antibody Screen NEG    Sample Expiration 03/21/2024,2359    Extend sample reason NO TRANSFUSIONS OR PREGNANCY IN THE PAST 3 MONTHS    Unit Number [id number]    Blood Component Type RED CELLS,LR    Unit division 00    Status of Unit REL FROM [facility]    Transfusion Status OK TO TRANSFUSE    Crossmatch Result COMPATIBLE    Unit Number [id number]    Blood Component Type RBC LR PHER1    Unit division 00    Status of Unit REL FROM [facility]    Transfusion Status OK TO TRANSFUSE    Crossmatch Result COMPATIBLE   BPAM  RBC  Result Value Ref Range   Blood Product Unit Number [id number]    PRODUCT CODE E0382V00    Unit Type and Rh 5100    Blood Product Expiration Date 797489817640    Blood Product Unit Number [id number]    PRODUCT CODE Z5467C99    Unit Type and Rh 5100    Blood Product Expiration Date 797489817640     Recent Results (from the past 2160 hours)  Type and screen [facility]     Status: None   Collection Time: 02/01/24 10:26 AM  Result Value Ref Range   ABO/RH(D) O POS    Antibody Screen POS    Sample Expiration 02/15/2024,2359    Extend sample reason NO TRANSFUSIONS OR PREGNANCY IN THE PAST 3 MONTHS    Antibody Identification NON SPECIFIC ANTIBODY REACTIVITY    DAT, IgG NEG    DAT, complement      NEG Performed at [facility], [address] [place], [place] [address]   CBC     Status: Abnormal   Collection Time: 02/01/24 10:26 AM  Result Value Ref Range   WBC 4.9 4.0 - 10.5 K/uL   RBC 4.19 3.87 - 5.11 MIL/uL   Hemoglobin 13.5 12.0 - 15.0 g/dL   HCT 58.7 63.9 - 53.9 %   MCV 98.3 80.0 - 100.0 fL   MCH 32.2 26.0 - 34.0 pg   MCHC 32.8 30.0 - 36.0 g/dL   RDW 84.3 (H) 88.4 - 84.4 %   Platelets 116 (L) 150 - 400 K/uL   nRBC 0.0 0.0 - 0.2 %    Comment: Performed at [facility], [address] [place], [place] [address]  Basic metabolic panel     Status: Abnormal   Collection Time: 02/01/24 10:26 AM  Result Value Ref Range   Sodium 138 135 - 145 mmol/L   Potassium 4.3 3.5 - 5.1 mmol/L   Chloride 97 (L) 98 - 111 mmol/L   CO2 24 22 - 32 mmol/L   Glucose, Bld 72 70 - 99 mg/dL    Comment: Glucose reference range applies only to samples taken after fasting for at least 8 hours.   BUN 46 (H) 8 - 23 mg/dL   Creatinine, Ser 2.97 (H) 0.44 - 1.00 mg/dL   Calcium  8.5 (L) 8.9 - 10.3 mg/dL   GFR, Estimated 6 (L) >60 mL/min    Comment: (NOTE) Calculated using the CKD-EPI Creatinine Equation (2021)    Anion gap 17 (H) 5 - 15  Comment: Performed at [facility], [address] [place], [place] [address]  Surgical pcr screen     Status: Abnormal   Collection Time: 02/01/24 10:35 AM   Specimen: Nasal Mucosa; Nasal Swab  Result Value Ref Range   MRSA, PCR POSITIVE (A) NEGATIVE    Comment: RESULT CALLED TO, READ BACK BY AND VERIFIED WITH: BRYAN GRAY AT 1416 ON 02/01/24 BY SS    Staphylococcus aureus POSITIVE (A) NEGATIVE    Comment: (NOTE) The Xpert SA Assay (FDA approved for NASAL specimens in patients 32 years of age and older), is one component of a comprehensive surveillance program. It is not intended to diagnose infection nor to guide or monitor treatment. Performed at [facility], [address], [place], [place] [address]   Potassium [facility] vascular lab only)     Status: Abnormal   Collection Time: 02/07/24  8:46 AM  Result Value Ref Range   Potassium [facility] vascular lab) 5.7 (H) 3.5 - 5.1 mmol/L    Comment: Performed at [facility], [address] [place], [place] [address]  Potassium     Status: Abnormal   Collection Time: 02/07/24  9:07 AM  Result Value Ref Range   Potassium 5.4 (H) 3.5 - 5.1 mmol/L    Comment: Performed at [facility], [address] [place], [place] [address]  Basic metabolic panel     Status: Abnormal   Collection Time: 03/07/24 11:11 AM  Result Value Ref Range   Sodium 139 135 - 145 mmol/L   Potassium 4.0 3.5 - 5.1 mmol/L   Chloride 97 (L) 98 - 111 mmol/L   CO2 24 22 - 32 mmol/L   Glucose, Bld 88 70 - 99 mg/dL    Comment: Glucose reference range applies only to samples taken after fasting for at least 8 hours.   BUN 52 (H) 8 - 23 mg/dL   Creatinine, Ser 3.03 (H) 0.44 - 1.00 mg/dL   Calcium  8.7 (L) 8.9 - 10.3 mg/dL   GFR, Estimated 6 (L) >60 mL/min    Comment: (NOTE) Calculated using the CKD-EPI Creatinine Equation (2021)    Anion gap 18 (H) 5 - 15    Comment: Performed at [facility], [address]  [place], [place] [address]  Type and screen [facility]     Status: None   Collection Time: 03/07/24 11:11 AM  Result Value Ref Range   ABO/RH(D) O POS    Antibody Screen NEG    Sample Expiration 03/21/2024,2359    Extend sample reason NO TRANSFUSIONS OR PREGNANCY IN THE PAST 3 MONTHS    Unit Number [id number]    Blood Component Type RED CELLS,LR    Unit division 00    Status of Unit REL FROM [facility]    Transfusion Status OK TO TRANSFUSE    Crossmatch Result COMPATIBLE    Unit Number [id number]    Blood Component Type RBC LR PHER1    Unit division 00    Status of Unit REL FROM [facility]    Transfusion Status OK TO TRANSFUSE    Crossmatch Result COMPATIBLE   Surgical pcr screen     Status: Abnormal   Collection Time: 03/07/24 11:11 AM   Specimen: Nasal Mucosa; Nasal Swab  Result Value Ref Range   MRSA, PCR POSITIVE (A) NEGATIVE    Comment: RESULT CALLED TO, READ BACK BY AND VERIFIED WITH: BRYAN GRAY 03/07/24 1409 MW    Staphylococcus aureus POSITIVE (A) NEGATIVE  Comment: (NOTE) The Xpert SA Assay (FDA approved for NASAL specimens in patients 57 years of age and older), is one component of a comprehensive surveillance program. It is not intended to diagnose infection nor to guide or monitor treatment. Performed at [facility], [address] [place], [place] [address]   CBC with Differential/Platelet     Status: Abnormal   Collection Time: 03/07/24 11:11 AM  Result Value Ref Range   WBC 6.4 4.0 - 10.5 K/uL   RBC 4.52 3.87 - 5.11 MIL/uL   Hemoglobin 14.3 12.0 - 15.0 g/dL   HCT 55.0 63.9 - 53.9 %   MCV 99.3 80.0 - 100.0 fL   MCH 31.6 26.0 - 34.0 pg   MCHC 31.8 30.0 - 36.0 g/dL   RDW 84.2 (H) 88.4 - 84.4 %   Platelets 146 (L) 150 - 400 K/uL   nRBC 0.0 0.0 - 0.2 %   Neutrophils Relative % 67 %   Neutro Abs 4.3 1.7 - 7.7 K/uL   Lymphocytes Relative 19 %   Lymphs Abs 1.2 0.7 - 4.0 K/uL   Monocytes Relative 12 %   Monocytes Absolute 0.8 0.1 -  1.0 K/uL   Eosinophils Relative 1 %   Eosinophils Absolute 0.0 0.0 - 0.5 K/uL   Basophils Relative 0 %   Basophils Absolute 0.0 0.0 - 0.1 K/uL   Immature Granulocytes 1 %   Abs Immature Granulocytes 0.03 0.00 - 0.07 K/uL    Comment: Performed at [facility], [address] [place], [place] [address]  BPAM RBC     Status: None   Collection Time: 03/07/24 11:11 AM  Result Value Ref Range   Blood Product Unit Number [id number]    PRODUCT CODE E0382V00    Unit Type and Rh 5100    Blood Product Expiration Date 797489817640    Blood Product Unit Number [id number]    PRODUCT CODE [id number]    Unit Type and Rh 5100    Blood Product Expiration Date 797489817640   I-STAT, chem 8     Status: Abnormal   Collection Time: 03/09/24  6:51 AM  Result Value Ref Range   Sodium 132 (L) 135 - 145 mmol/L   Potassium 5.6 (H) 3.5 - 5.1 mmol/L   Chloride 104 98 - 111 mmol/L   BUN 67 (H) 8 - 23 mg/dL   Creatinine, Ser 3.39 (H) 0.44 - 1.00 mg/dL   Glucose, Bld 896 (H) 70 - 99 mg/dL    Comment: Glucose reference range applies only to samples taken after fasting for at least 8 hours.   Calcium , Ion 0.71 (LL) 1.15 - 1.40 mmol/L   TCO2 24 22 - 32 mmol/L   Hemoglobin 15.0 12.0 - 15.0 g/dL   HCT 55.9 63.9 - 53.9 %  Potassium     Status: Abnormal   Collection Time: 03/09/24  7:15 AM  Result Value Ref Range   Potassium 3.4 (L) 3.5 - 5.1 mmol/L    Comment: Performed at [facility], [address], [place], [place] [address]       Assessment & Plan Chronic combined systolic and diastolic CHF (congestive heart failure) [facility]) Patient is seen by cardiology, who manage this condition.  She is well controlled with current therapy.   Will defer to them for further changes to plan of care.  Bipolar disorder in full remission, most recent episode unspecified type Other psychoactive substance abuse, uncomplicated (HCC) Patient stable.  Well controlled with current therapy.  Continue current meds.   Other cirrhosis of liver (HCC) Patient stable.  Well controlled with current therapy.   Continue current meds.   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  Well controlled with current therapy.   Continue current meds.   Chronic pain syndrome Sending refill for patient's pain meds.  Will discuss possible pain clinic spot with Dr. Albina.      Return in about 3 months (around 06/06/2024) for F/U.   Total time spent: 30 minutes  ALAN CHRISTELLA ARRANT, FNP  03/07/2024   This document may have been prepared by [facility] Voice Recognition software and as such may include unintentional dictation errors.

## 2024-03-15 NOTE — Assessment & Plan Note (Signed)
 Patient stable.  Well controlled with current therapy.   Continue current meds.

## 2024-03-15 NOTE — Assessment & Plan Note (Signed)
 Sending refill for patient's pain meds.  Will discuss possible pain clinic spot with Dr. Albina.

## 2024-03-15 NOTE — Assessment & Plan Note (Signed)
Patient is seen by cardiology, who manage this condition.  She is well controlled with current therapy.   Will defer to them for further changes to plan of care.

## 2024-03-16 ENCOUNTER — Encounter: Payer: Self-pay | Admitting: Oncology

## 2024-03-21 ENCOUNTER — Ambulatory Visit (INDEPENDENT_AMBULATORY_CARE_PROVIDER_SITE_OTHER): Admitting: Nurse Practitioner

## 2024-03-21 ENCOUNTER — Encounter (INDEPENDENT_AMBULATORY_CARE_PROVIDER_SITE_OTHER)

## 2024-03-22 ENCOUNTER — Other Ambulatory Visit (INDEPENDENT_AMBULATORY_CARE_PROVIDER_SITE_OTHER): Payer: Self-pay | Admitting: Vascular Surgery

## 2024-03-22 DIAGNOSIS — N186 End stage renal disease: Secondary | ICD-10-CM

## 2024-03-23 ENCOUNTER — Ambulatory Visit (INDEPENDENT_AMBULATORY_CARE_PROVIDER_SITE_OTHER)

## 2024-03-23 ENCOUNTER — Ambulatory Visit (INDEPENDENT_AMBULATORY_CARE_PROVIDER_SITE_OTHER): Admitting: Nurse Practitioner

## 2024-03-23 ENCOUNTER — Encounter (INDEPENDENT_AMBULATORY_CARE_PROVIDER_SITE_OTHER): Payer: Self-pay | Admitting: Nurse Practitioner

## 2024-03-23 VITALS — BP 127/88 | HR 92 | Resp 18 | Ht 66.0 in | Wt 164.2 lb

## 2024-03-23 DIAGNOSIS — N186 End stage renal disease: Secondary | ICD-10-CM

## 2024-03-23 DIAGNOSIS — E785 Hyperlipidemia, unspecified: Secondary | ICD-10-CM

## 2024-03-23 MED ORDER — OXYCODONE-ACETAMINOPHEN 5-325 MG PO TABS: 1.0000 | ORAL_TABLET | Freq: Four times a day (QID) | ORAL | 0 refills | Status: DC | PRN

## 2024-03-23 NOTE — Progress Notes (Unsigned)
 Subjective:    Patient ID: [name], female    DOB: Oct 01, 1960, 63 y.o.   MRN: [id number] Chief Complaint  Patient presents with  . Follow-up    Follow up 6 weeks HDA    HPI  Review of Systems  Cardiovascular:  Positive for leg swelling.  All other systems reviewed and are negative.      Objective:   Physical Exam Vitals reviewed.  HENT:     Head: Normocephalic.  Cardiovascular:     Rate and Rhythm: Normal rate.     Pulses:          Radial pulses are 0 on the right side.  Pulmonary:     Effort: Pulmonary effort is normal.  Skin:    General: Skin is dry.     Comments: Cool right hand   Neurological:     Mental Status: She is alert and oriented to person, place, and time.  Psychiatric:        Mood and Affect: Mood normal.        Behavior: Behavior normal.        Thought Content: Thought content normal.        Judgment: Judgment normal.     BP 127/88 (BP Location: Left Leg, Patient Position: Sitting, Cuff Size: Small)   Pulse 92   Resp 18   Ht 5' 6 (1.676 m)   Wt 164 lb 3.2 oz (74.5 kg)   BMI 26.50 kg/m   Past Medical History:  Diagnosis Date  . Acute lacunar infarction (HCC) 03/03/2014   a.) MRI brain 03/03/2014: two acute punctate lacunar infarcts anteriorly in the BILATERAL centrum semiovale  . Adult behavior problems   . Anemia of chronic renal failure   . Aortic atherosclerosis   . Arthritis   . Atrial fibrillation and flutter (HCC)    a.) CHA2DS2VASc = 6 (sex, HFrEF, HTN, CVA x 2, prior MI/vascular disease) as of 02/04/2024; b.) rate/rhythm maintained on oral metoprolol  succinate; chronically anticoagulated with apixaban   . Bipolar disorder (HCC)   . Cardiomegaly   . Cerebral microvascular disease   . CHF (congestive heart failure) (HCC)   . Chicken pox   . Cholelithiasis   . Chronic respiratory failure with hypoxia (HCC) 12/19/2021  . COPD (chronic obstructive pulmonary disease) (HCC)   . Coronary artery disease   . DDD (degenerative  disc disease), cervical   . DDD (degenerative disc disease), thoracolumbar   . Diverticulosis   . Dyspnea   . ESRD (end stage renal disease) on dialysis (M,W,F)   . GERD (gastroesophageal reflux disease)   . HCV (hepatitis C virus)   . Headache   . HFrEF (heart failure with reduced ejection fraction) (HCC)   . History of delirium 04/12/2014  . History of substance abuse (HCC)    a.) tobacco + cocaine  . Hyperkalemia 11/13/2014  . Hyperlipidemia   . Hypertension   . Hypotension 02/24/2014  . Hypothyroidism   . Ischemic cardiomyopathy   . Nose colonized with MRSA 02/01/2024   a.) presurgical PCR (+) on 02/01/2024 prior to A/V FISTULAGRAM; b.) presurgical PCR (+) on 03/07/2024 prior to AV FISTULA CREATION  . NSTEMI (non-ST elevated myocardial infarction) (HCC) 10/08/2013  . NSTEMI (non-ST elevated myocardial infarction) (HCC) 01/23/2002   a.) LHC 01/24/2002 --> multi-vessel CAD with IRA being 90% OM2 --> delayed PCI until 01/25/2002 --> 3.0 x 23 mm BX Velocity stent  . Obesity   . 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   .  On apixaban  therapy   . Peripheral vascular disease   . Pneumonia   . Postoperative anemia due to acute blood loss 02/27/2014  . Postoperative cerebrovascular infarction following cardiac surgery 02/21/2014   a.) developed RIGHT upper/lower extremity weakness following cardiac revascularization --> neuro consult and head CT --> Hypodensity noted in the area of the LEFT motor cortex consistent with a component of acute ischemia  . Pulmonary hypertension (HCC)   . RBBB (right bundle branch block)   . Renal osteodystrophy   . S/P CABG x 3 02/20/2014   a.) LIMA-LAD, SVG-OM1, SVG-PDA  . Stroke (HCC) 05/23/2010   a.) brain MRI 05/23/2010: BILATERAL acute infarcts and old prior infarcts; RIGHT frontal lobe extending into the white matter. Small focus of restricted diffusion in the cortex of the medial LEFT frontoparietal lobe. Periventricular T2 and multiple  foci of T2 hyperintensity in the subcortical white matter and increased FLAIR signal in the cortex of the LEFT frontal lobe.  . Syncope and collapse 07/25/2020    Social History   Socioeconomic History  . Marital status: Single    Spouse name: Not on file  . Number of children: Not on file  . Years of education: Not on file  . Highest education level: Not on file  Occupational History  . Not on file  Tobacco Use  . Smoking status: Every Day    Current packs/day: 0.25    Average packs/day: 0.3 packs/day for 20.0 years (5.0 ttl pk-yrs)    Types: Cigarettes  . Smokeless tobacco: Never  . Tobacco comments:    smokes 1 - 1.5 cigarettes a day  Vaping Use  . Vaping status: Never Used  Substance and Sexual Activity  . Alcohol use: No    Comment: social  . Drug use: No  . Sexual activity: Not Currently    Birth control/protection: Post-menopausal  Other Topics Concern  . Not on file  Social History Narrative   Lives alone   Social Drivers of Health   Financial Resource Strain: Not on file  Food Insecurity: No Food Insecurity (05/18/2023)   Hunger Vital Sign   . Worried About Programme researcher, broadcasting/film/video in the Last Year: Never true   . Ran Out of Food in the Last Year: Never true  Transportation Needs: No Transportation Needs (05/18/2023)   PRAPARE - Transportation   . Lack of Transportation (Medical): No   . Lack of Transportation (Non-Medical): No  Physical Activity: Not on file  Stress: Not on file  Social Connections: Not on file  Intimate Partner Violence: Not At Risk (05/18/2023)   Humiliation, Afraid, Rape, and Kick questionnaire   . Fear of Current or Ex-Partner: No   . Emotionally Abused: No   . Physically Abused: No   . Sexually Abused: No    Past Surgical History:  Procedure Laterality Date  . A/V FISTULAGRAM Left 03/30/2019   Procedure: A/V FISTULAGRAM;  Surgeon: Marea Selinda RAMAN, MD;  Location: [facility];  Service: Cardiovascular;  Laterality: Left;  . A/V  FISTULAGRAM Left 10/06/2019   Procedure: A/V FISTULAGRAM;  Surgeon: Marea Selinda RAMAN, MD;  Location: [facility];  Service: Cardiovascular;  Laterality: Left;  . A/V FISTULAGRAM Left 05/19/2021   Procedure: A/V FISTULAGRAM;  Surgeon: Marea Selinda RAMAN, MD;  Location: [facility];  Service: Cardiovascular;  Laterality: Left;  . A/V FISTULAGRAM Left 01/29/2022   Procedure: A/V Fistulagram;  Surgeon: Marea Selinda RAMAN, MD;  Location: [facility];  Service: Cardiovascular;  Laterality: Left;  . A/V FISTULAGRAM Left 04/30/2022   Procedure: A/V Fistulagram;  Surgeon: Marea Selinda RAMAN, MD;  Location: [facility];  Service: Cardiovascular;  Laterality: Left;  . A/V FISTULAGRAM Left 08/20/2022   Procedure: A/V Fistulagram;  Surgeon: Marea Selinda RAMAN, MD;  Location: [facility];  Service: Cardiovascular;  Laterality: Left;  . A/V FISTULAGRAM N/A 12/03/2022   Procedure: A/V Fistulagram;  Surgeon: Marea Selinda RAMAN, MD;  Location: [facility];  Service: Cardiovascular;  Laterality: N/A;  . A/V FISTULAGRAM Left 02/11/2023   Procedure: A/V Fistulagram;  Surgeon: Marea Selinda RAMAN, MD;  Location: [facility];  Service: Cardiovascular;  Laterality: Left;  . A/V FISTULAGRAM Left 02/07/2024   Procedure: A/V Fistulagram;  Surgeon: Marea Selinda RAMAN, MD;  Location: [facility];  Service: Cardiovascular;  Laterality: Left;  . COLONOSCOPY    . COLONOSCOPY WITH PROPOFOL  N/A 04/22/2021   Procedure: COLONOSCOPY WITH PROPOFOL ;  Surgeon: Maryruth Ole DASEN, MD;  Location: [facility];  Service: Endoscopy;  Laterality: N/A;  . CORONARY ANGIOPLASTY WITH STENT PLACEMENT Left 01/25/2002   Procedure: CORONARY ANGIOPLASTY WITH STENT PLACEMENT; Location: [facility]; Surgeon: Margie Lovelace, MD  . CORONARY ARTERY BYPASS GRAFT N/A 02/20/2014   Procedure: CORONARY ARTERY BYPASS GRAFT; Location: [facility]; Surgeon: Reyes Fruits, MD  . DIALYSIS FISTULA CREATION    . DIALYSIS/PERMA CATHETER INSERTION N/A  04/08/2023   Procedure: DIALYSIS/PERMA CATHETER INSERTION;  Surgeon: Marea Selinda RAMAN, MD;  Location: [facility];  Service: Cardiovascular;  Laterality: N/A;  . DIALYSIS/PERMA CATHETER REMOVAL N/A 07/06/2023   Procedure: DIALYSIS/PERMA CATHETER REMOVAL;  Surgeon: [name] Cordella MATSU, MD;  Location: [facility];  Service: Cardiovascular;  Laterality: N/A;  . ESOPHAGOGASTRODUODENOSCOPY    . ESOPHAGOGASTRODUODENOSCOPY N/A 04/22/2021   Procedure: ESOPHAGOGASTRODUODENOSCOPY (EGD);  Surgeon: Maryruth Ole DASEN, MD;  Location[facility];  Service: Endoscopy;  Laterality: N/A;  . FLEXIBLE SIGMOIDOSCOPY N/A 02/23/2019   Procedure: FLEXIBLE SIGMOIDOSCOPY;  Surgeon: Gaylyn Gladis PENNER, MD;  Location: [facility] ENDOSCOPY;  Service: Endoscopy;  Laterality: N/A;  . IABP INSERTION  02/20/2014   Procedure: INTRA-AORTIC BALLOON PUMP INSERTION; Location: [facility]; Surgeon: Reyes Fruits, MD  . INSERTION OF ARTERIOVENOUS (AV) ARTEGRAFT ARM Right 03/09/2024   Procedure: INSERTION, GRAFT, ARTERIOVENOUS, UPPER EXTREMITY;  Surgeon: Marea Selinda RAMAN, MD;  Location: [facility];  Service: Vascular;  Laterality: Right;  BRACHIAL AXILLARY  . LEFT HEART CATH AND CORONARY ANGIOGRAPHY Left 01/24/2002   Procedure: LEFT HEART CATH AND CORONARY ANGIOGRAPHY; Location: [facility]; Surgeon: Margie Lovelace, MD  . LEFT HEART CATH AND CORONARY ANGIOGRAPHY Left 05/25/2012   Procedure: LEFT HEART CATH AND CORONARY ANGIOGRAPHY; Location: [facility]; Surgeon: Margie Lovelace, MD  . LEFT HEART CATH AND CORONARY ANGIOGRAPHY Left 10/09/2013   Procedure: LEFT HEART CATH AND CORONARY ANGIOGRAPHY; Location: [facility]; Surgeon: Vinie Jude, MD  . LEFT HEART CATH AND CORS/GRAFTS ANGIOGRAPHY N/A 08/08/2018   Procedure: LEFT HEART CATH AND CORS/GRAFTS ANGIOGRAPHY;  Surgeon: Fernand Denyse LABOR, MD;  Location: [facility];  Service: Cardiovascular;  Laterality: N/A;  . LEFT HEART CATH AND CORS/GRAFTS ANGIOGRAPHY N/A 01/30/2019   Procedure: LEFT HEART CATH  AND CORS/GRAFTS ANGIOGRAPHY;  Surgeon: Fernand Denyse LABOR, MD;  Location: [facility];  Service: Cardiovascular;  Laterality: N/A;  . ultrasound guided pericardiocentesis      Family History  Problem Relation Age of Onset  . Hypertension Mother   . Ovarian cancer Mother   . Diabetes type II Mother   .  Hypertension Father   . Kidney disease Father   . Hypertension Sister   . Diabetes type II Maternal Grandmother   . Breast cancer Maternal Grandmother   . Breast cancer Maternal Aunt   . Hypertension Other   . Cancer Other   . Renal Disease Other     Allergies  Allergen Reactions  . Morphine  And Codeine Hives  . Levaquin [Levofloxacin] Itching and Other (See Comments)    Severe itching; prickly sensation; severe left leg pain; immobility.  . Enalapril  Other (See Comments)    hallucinations  . Levothyroxine      Immense pain in left hip area   . Latex Rash  . Tape Rash and Other (See Comments)       Latest Ref Rng & Units 03/09/2024    6:51 AM 03/07/2024   11:11 AM 02/01/2024   10:26 AM  CBC  WBC 4.0 - 10.5 K/uL  6.4  4.9   Hemoglobin 12.0 - 15.0 g/dL 84.9  85.6  86.4   Hematocrit 36.0 - 46.0 % 44.0  44.9  41.2   Platelets 150 - 400 K/uL  146  116       CMP     Component Value Date/Time   NA 132 (L) 03/09/2024 0651   NA 139 07/09/2014 0503   K 3.4 (L) 03/09/2024 0715   K 4.7 07/11/2014 0958   CL 104 03/09/2024 0651   CL 98 07/09/2014 0503   CO2 24 03/07/2024 1111   CO2 30 07/09/2014 0503   GLUCOSE 103 (H) 03/09/2024 0651   GLUCOSE 107 (H) 07/09/2014 0503   BUN 67 (H) 03/09/2024 0651   BUN 39 (H) 07/09/2014 0503   CREATININE 6.60 (H) 03/09/2024 0651   CREATININE 5.53 (H) 07/09/2014 0503   CALCIUM  8.7 (L) 03/07/2024 1111   CALCIUM  8.3 (L) 07/09/2014 0503   PROT 7.7 05/17/2023 0747   PROT 8.4 (H) 07/07/2014 2207   ALBUMIN  3.6 05/19/2023 0940   ALBUMIN  3.1 (L) 07/07/2014 2207   AST 24 05/17/2023 0747   AST 76 (H) 07/07/2014 2207   ALT 16 05/17/2023  0747   ALT 41 07/07/2014 2207   ALKPHOS 105 05/17/2023 0747   ALKPHOS 107 07/07/2014 2207   BILITOT 0.8 05/17/2023 0747   BILITOT 0.7 07/07/2014 2207   GFRNONAA 6 (L) 03/07/2024 1111   GFRNONAA 9 (L) 07/09/2014 0503   GFRNONAA 9 (L) 02/11/2014 0300     No results found.     Assessment & Plan:   1. ESRD (end stage renal disease) (HCC) (Primary) Despite the patient's flow volume and her access being low she is exhibiting symptoms of steal syndrome.  She is having numbness in her hand during dialysis and her hand is notably cooler than her left today.  She also does not have a palpable pulse.  That said, it will be imperative to treat her still before we try to treat her stenosis in her access.  I am fearful that if we were to treat her access before we treat her still she would end up with much more severe still symptoms that she is currently exhibiting.  I have discussed a right upper extremity angiogram with the patient.  We have discussed the risk benefits and alternatives.  Based on this discussion she is agreeable to proceed.  I have discussed that if we are ultimately unable to treat her steal syndrome, she would need to have a catheter placed for dialysis.  2. Hyperlipidemia, unspecified hyperlipidemia type Continue statin  as ordered and reviewed, no changes at this time   Current Outpatient Medications on File Prior to Visit  Medication Sig Dispense Refill  . albuterol  (VENTOLIN  HFA) 108 (90 Base) MCG/ACT inhaler Inhale 1-2 puffs into the lungs every 6 (six) hours as needed for wheezing or shortness of breath. 20.1 g 3  . apixaban  (ELIQUIS ) 5 MG TABS tablet Take 5 mg by mouth 2 (two) times daily.    . hydrOXYzine  (ATARAX /VISTARIL ) 50 MG tablet Take 50 mg by mouth at bedtime.    . levothyroxine  (SYNTHROID ) 75 MCG tablet Take 75 mcg by mouth daily before breakfast.     . lidocaine -prilocaine  (EMLA ) cream Apply 1 Application topically once.    . metoprolol  succinate (TOPROL -XL) 25  MG 24 hr tablet Take 1 tablet by mouth daily.    . midodrine  (PROAMATINE ) 10 MG tablet Take 1 tablet (10 mg total) by mouth 3 (three) times daily. 90 tablet 0  . mupirocin  ointment (BACTROBAN ) 2 % Apply small amount to the inside of both nostrils TWICE a day for the next 5 days. 15 g 0  . pantoprazole  (PROTONIX ) 40 MG tablet Take 40 mg by mouth daily.    . ranolazine  (RANEXA ) 1000 MG SR tablet Take 1,000 mg by mouth 2 (two) times daily.    . rosuvastatin  (CRESTOR ) 20 MG tablet Take 20 mg by mouth at bedtime.    . sevelamer  carbonate (RENVELA ) 800 MG tablet Take 3,200 mg by mouth 3 (three) times daily with meals.    . triamcinolone  cream (KENALOG ) 0.1 % Apply 1 Application topically 2 (two) times daily. 453.6 g 2   Current Facility-Administered Medications on File Prior to Visit  Medication Dose Route Frequency Provider Last Rate Last Admin  . heparin  injection    PRN Dew, Jason S, MD   10,000 Units at 04/08/23 1412    There are no Patient Instructions on file for this visit. No follow-ups on file.   Vianny Schraeder E Tajuana Kniskern, NP

## 2024-03-23 NOTE — H&P (View-Only) (Signed)
 Subjective:    Patient ID: [name], female    DOB: [birth year], 63 y.o.   MRN: [id number] Chief Complaint  Patient presents with   Follow-up    Follow up 6 weeks HDA    The patient returns today for follow-up after right brachial axillary AV graft insertion on 03/09/2024.  She notes that the area is still very sore and very tender.  There has been no drainage of of the access.  However she does note that she has numbness and tingling in her hand when she is on dialysis.  On physical exam it is noted that she does not have a palpable pulse and her hand is notably cooler than her left.  She still continuing to use her left upper extremity joint graft for dialysis.  Today her studies indicate a stenosis near the arterial anastomosis.  She has a flow volume of 315.    Review of Systems  Cardiovascular:  Positive for leg swelling.  All other systems reviewed and are negative.      Objective:   Physical Exam Vitals reviewed.  HENT:     Head: Normocephalic.  Cardiovascular:     Rate and Rhythm: Normal rate.     Pulses:          Radial pulses are 0 on the right side.  Pulmonary:     Effort: Pulmonary effort is normal.  Skin:    General: Skin is dry.     Comments: Cool right hand   Neurological:     Mental Status: She is alert and oriented to person, place, and time.  Psychiatric:        Mood and Affect: Mood normal.        Behavior: Behavior normal.        Thought Content: Thought content normal.        Judgment: Judgment normal.     BP 127/88 (BP Location: Left Leg, Patient Position: Sitting, Cuff Size: Small)   Pulse 92   Resp 18   Ht 5' 6 (1.676 m)   Wt 164 lb 3.2 oz (74.5 kg)   BMI 26.50 kg/m   Past Medical History:  Diagnosis Date   Acute lacunar infarction (HCC) 03/03/2014   a.) MRI brain 03/03/2014: two acute punctate lacunar infarcts anteriorly in the BILATERAL centrum semiovale   Adult behavior problems    Anemia of chronic renal failure    Aortic  atherosclerosis    Arthritis    Atrial fibrillation and flutter (HCC)    a.) CHA2DS2VASc = 6 (sex, HFrEF, HTN, CVA x 2, prior MI/vascular disease) as of 02/04/2024; b.) rate/rhythm maintained on oral metoprolol  succinate; chronically anticoagulated with apixaban    Bipolar disorder (HCC)    Cardiomegaly    Cerebral microvascular disease    CHF (congestive heart failure) (HCC)    Chicken pox    Cholelithiasis    Chronic respiratory failure with hypoxia (HCC) 12/19/2021   COPD (chronic obstructive pulmonary disease) (HCC)    Coronary artery disease    DDD (degenerative disc disease), cervical    DDD (degenerative disc disease), thoracolumbar    Diverticulosis    Dyspnea    ESRD (end stage renal disease) on dialysis (M,W,F)    GERD (gastroesophageal reflux disease)    HCV (hepatitis C virus)    Headache    HFrEF (heart failure with reduced ejection fraction) (HCC)    History of delirium 04/12/2014   History of substance abuse (HCC)    a.) tobacco +  cocaine   Hyperkalemia 11/13/2014   Hyperlipidemia    Hypertension    Hypotension 02/24/2014   Hypothyroidism    Ischemic cardiomyopathy    Nose colonized with MRSA 02/01/2024   a.) presurgical PCR (+) on 02/01/2024 prior to A/V FISTULAGRAM; b.) presurgical PCR (+) on 03/07/2024 prior to AV FISTULA CREATION   NSTEMI (non-ST elevated myocardial infarction) (HCC) 10/08/2013   NSTEMI (non-ST elevated myocardial infarction) (HCC) 01/23/2002   a.) LHC 01/24/2002 --> multi-vessel CAD with IRA being 90% OM2 --> delayed PCI until 01/25/2002 --> 3.0 x 23 mm BX Velocity stent   Obesity    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    On apixaban  therapy    Peripheral vascular disease    Pneumonia    Postoperative anemia due to acute blood loss 02/27/2014   Postoperative cerebrovascular infarction following cardiac surgery 02/21/2014   a.) developed RIGHT upper/lower extremity weakness following cardiac revascularization --> neuro consult  and head CT --> Hypodensity noted in the area of the LEFT motor cortex consistent with a component of acute ischemia   Pulmonary hypertension (HCC)    RBBB (right bundle branch block)    Renal osteodystrophy    S/P CABG x 3 02/20/2014   a.) LIMA-LAD, SVG-OM1, SVG-PDA   Stroke (HCC) 05/23/2010   a.) brain MRI 05/23/2010: BILATERAL acute infarcts and old prior infarcts; RIGHT frontal lobe extending into the white matter. Small focus of restricted diffusion in the cortex of the medial LEFT frontoparietal lobe. Periventricular T2 and multiple foci of T2 hyperintensity in the subcortical white matter and increased FLAIR signal in the cortex of the LEFT frontal lobe.   Syncope and collapse 07/25/2020    Social History   Socioeconomic History   Marital status: Single    Spouse name: Not on file   Number of children: Not on file   Years of education: Not on file   Highest education level: Not on file  Occupational History   Not on file  Tobacco Use   Smoking status: Every Day    Current packs/day: 0.25    Average packs/day: 0.3 packs/day for 20.0 years (5.0 ttl pk-yrs)    Types: Cigarettes   Smokeless tobacco: Never   Tobacco comments:    smokes 1 - 1.5 cigarettes a day  Vaping Use   Vaping status: Never Used  Substance and Sexual Activity   Alcohol use: No    Comment: social   Drug use: No   Sexual activity: Not Currently    Birth control/protection: Post-menopausal  Other Topics Concern   Not on file  Social History Narrative   Lives alone   Social Drivers of Health   Financial Resource Strain: Not on file  Food Insecurity: No Food Insecurity (05/18/2023)   Hunger Vital Sign    Worried About Running Out of Food in the Last Year: Never true    Ran Out of Food in the Last Year: Never true  Transportation Needs: No Transportation Needs (05/18/2023)   PRAPARE - Administrator, Civil Service (Medical): No    Lack of Transportation (Non-Medical): No  Physical  Activity: Not on file  Stress: Not on file  Social Connections: Not on file  Intimate Partner Violence: Not At Risk (05/18/2023)   Humiliation, Afraid, Rape, and Kick questionnaire    Fear of Current or Ex-Partner: No    Emotionally Abused: No    Physically Abused: No    Sexually Abused: No    Past Surgical  History:  Procedure Laterality Date   A/V FISTULAGRAM Left 03/30/2019   Procedure: A/V FISTULAGRAM;  Surgeon: Marea Selinda RAMAN, MD;  Location: [facility];  Service: Cardiovascular;  Laterality: Left;   A/V FISTULAGRAM Left 10/06/2019   Procedure: A/V FISTULAGRAM;  Surgeon: Marea Selinda RAMAN, MD;  Location: [facility];  Service: Cardiovascular;  Laterality: Left;   A/V FISTULAGRAM Left 05/19/2021   Procedure: A/V FISTULAGRAM;  Surgeon: Marea Selinda RAMAN, MD;  Location: [facility];  Service: Cardiovascular;  Laterality: Left;   A/V FISTULAGRAM Left 01/29/2022   Procedure: A/V Fistulagram;  Surgeon: Marea Selinda RAMAN, MD;  Location: [facility];  Service: Cardiovascular;  Laterality: Left;   A/V FISTULAGRAM Left 04/30/2022   Procedure: A/V Fistulagram;  Surgeon: Marea Selinda RAMAN, MD;  Location: [facility];  Service: Cardiovascular;  Laterality: Left;   A/V FISTULAGRAM Left 08/20/2022   Procedure: A/V Fistulagram;  Surgeon: Marea Selinda RAMAN, MD;  Location: [facility];  Service: Cardiovascular;  Laterality: Left;   A/V FISTULAGRAM N/A 12/03/2022   Procedure: A/V Fistulagram;  Surgeon: Marea Selinda RAMAN, MD;  Location: [facility];  Service: Cardiovascular;  Laterality: N/A;   A/V FISTULAGRAM Left 02/11/2023   Procedure: A/V Fistulagram;  Surgeon: Marea Selinda RAMAN, MD;  Location: [facility];  Service: Cardiovascular;  Laterality: Left;   A/V FISTULAGRAM Left 02/07/2024   Procedure: A/V Fistulagram;  Surgeon: Marea Selinda RAMAN, MD;  Location: [facility];  Service: Cardiovascular;  Laterality: Left;   COLONOSCOPY     COLONOSCOPY WITH PROPOFOL   N/A 04/22/2021   Procedure: COLONOSCOPY WITH PROPOFOL ;  Surgeon: Maryruth Ole DASEN, MD;  Location: [facility];  Service: Endoscopy;  Laterality: N/A;   CORONARY ANGIOPLASTY WITH STENT PLACEMENT Left 01/25/2002   Procedure: CORONARY ANGIOPLASTY WITH STENT PLACEMENT; Location: [facility]; Surgeon: Margie Lovelace, MD   CORONARY ARTERY BYPASS GRAFT N/A 02/20/2014   Procedure: CORONARY ARTERY BYPASS GRAFT; Location: [facility]; Surgeon: Reyes Fruits, MD   DIALYSIS FISTULA CREATION     DIALYSIS/PERMA CATHETER INSERTION N/A 04/08/2023   Procedure: DIALYSIS/PERMA CATHETER INSERTION;  Surgeon: Marea Selinda RAMAN, MD;  Location: [facility];  Service: Cardiovascular;  Laterality: N/A;   DIALYSIS/PERMA CATHETER REMOVAL N/A 07/06/2023   Procedure: DIALYSIS/PERMA CATHETER REMOVAL;  Surgeon: [name] Cordella MATSU, MD;  Location: [facility];  Service: Cardiovascular;  Laterality: N/A;   ESOPHAGOGASTRODUODENOSCOPY     ESOPHAGOGASTRODUODENOSCOPY N/A 04/22/2021   Procedure: ESOPHAGOGASTRODUODENOSCOPY (EGD);  Surgeon: Maryruth Ole DASEN, MD;  Location: [facility];  Service: Endoscopy;  Laterality: N/A;   FLEXIBLE SIGMOIDOSCOPY N/A 02/23/2019   Procedure: FLEXIBLE SIGMOIDOSCOPY;  Surgeon: Gaylyn Gladis PENNER, MD;  Location: [facility];  Service: Endoscopy;  Laterality: N/A;   IABP INSERTION  02/20/2014   Procedure: INTRA-AORTIC BALLOON PUMP INSERTION; Location: [facility]; Surgeon: Reyes Fruits, MD   INSERTION OF ARTERIOVENOUS (AV) ARTEGRAFT ARM Right 03/09/2024   Procedure: INSERTION, GRAFT, ARTERIOVENOUS, UPPER EXTREMITY;  Surgeon: Marea Selinda RAMAN, MD;  Location: [facility];  Service: Vascular;  Laterality: Right;  BRACHIAL AXILLARY   LEFT HEART CATH AND CORONARY ANGIOGRAPHY Left 01/24/2002   Procedure: LEFT HEART CATH AND CORONARY ANGIOGRAPHY; Location: [facility]; Surgeon: Margie Lovelace, MD   LEFT HEART CATH AND CORONARY ANGIOGRAPHY Left 05/25/2012   Procedure: LEFT HEART CATH AND CORONARY ANGIOGRAPHY; Location:  [facility]; Surgeon: Margie Lovelace, MD   LEFT HEART CATH AND CORONARY ANGIOGRAPHY Left 10/09/2013   Procedure: LEFT HEART CATH AND CORONARY ANGIOGRAPHY; Location: [facility];  Surgeon: Vinie Jude, MD   LEFT HEART CATH AND CORS/GRAFTS ANGIOGRAPHY N/A 08/08/2018   Procedure: LEFT HEART CATH AND CORS/GRAFTS ANGIOGRAPHY;  Surgeon: Fernand Denyse LABOR, MD;  Location: [facility];  Service: Cardiovascular;  Laterality: N/A;   LEFT HEART CATH AND CORS/GRAFTS ANGIOGRAPHY N/A 01/30/2019   Procedure: LEFT HEART CATH AND CORS/GRAFTS ANGIOGRAPHY;  Surgeon: Fernand Denyse LABOR, MD;  Location: [facility];  Service: Cardiovascular;  Laterality: N/A;   ultrasound guided pericardiocentesis      Family History  Problem Relation Age of Onset   Hypertension Mother    Ovarian cancer Mother    Diabetes type II Mother    Hypertension Father    Kidney disease Father    Hypertension Sister    Diabetes type II Maternal Grandmother    Breast cancer Maternal Grandmother    Breast cancer Maternal Aunt    Hypertension Other    Cancer Other    Renal Disease Other     Allergies  Allergen Reactions   Morphine  And Codeine Hives   Levaquin [Levofloxacin] Itching and Other (See Comments)    Severe itching; prickly sensation; severe left leg pain; immobility.   Enalapril  Other (See Comments)    hallucinations   Levothyroxine      Immense pain in left hip area    Latex Rash   Tape Rash and Other (See Comments)       Latest Ref Rng & Units 03/09/2024    6:51 AM 03/07/2024   11:11 AM 02/01/2024   10:26 AM  CBC  WBC 4.0 - 10.5 K/uL  6.4  4.9   Hemoglobin 12.0 - 15.0 g/dL 84.9  85.6  86.4   Hematocrit 36.0 - 46.0 % 44.0  44.9  41.2   Platelets 150 - 400 K/uL  146  116       CMP     Component Value Date/Time   NA 132 (L) 03/09/2024 0651   NA 139 07/09/2014 0503   K 3.4 (L) 03/09/2024 0715   K 4.7 07/11/2014 0958   CL 104 03/09/2024 0651   CL 98 07/09/2014 0503   CO2 24 03/07/2024 1111   CO2 30  07/09/2014 0503   GLUCOSE 103 (H) 03/09/2024 0651   GLUCOSE 107 (H) 07/09/2014 0503   BUN 67 (H) 03/09/2024 0651   BUN 39 (H) 07/09/2014 0503   CREATININE 6.60 (H) 03/09/2024 0651   CREATININE 5.53 (H) 07/09/2014 0503   CALCIUM  8.7 (L) 03/07/2024 1111   CALCIUM  8.3 (L) 07/09/2014 0503   PROT 7.7 05/17/2023 0747   PROT 8.4 (H) 07/07/2014 2207   ALBUMIN  3.6 05/19/2023 0940   ALBUMIN  3.1 (L) 07/07/2014 2207   AST 24 05/17/2023 0747   AST 76 (H) 07/07/2014 2207   ALT 16 05/17/2023 0747   ALT 41 07/07/2014 2207   ALKPHOS 105 05/17/2023 0747   ALKPHOS 107 07/07/2014 2207   BILITOT 0.8 05/17/2023 0747   BILITOT 0.7 07/07/2014 2207   GFRNONAA 6 (L) 03/07/2024 1111   GFRNONAA 9 (L) 07/09/2014 0503   GFRNONAA 9 (L) 02/11/2014 0300     No results found.     Assessment & Plan:   1. ESRD (end stage renal disease) (HCC) (Primary) Despite the patient's flow volume and her access being low she is exhibiting symptoms of steal syndrome.  She is having numbness in her hand during dialysis and her hand is notably cooler than her left today.  She also does not have a palpable pulse.  That said, it will be imperative to treat her still before we try to treat her stenosis in her access.  I am fearful that if we were to treat her access before we treat her still she would end up with much more severe still symptoms that she is currently exhibiting.  I have discussed a right upper extremity angiogram with the patient.  We have discussed the risk benefits and alternatives.  Based on this discussion she is agreeable to proceed.  I have discussed that if we are ultimately unable to treat her steal syndrome, she would need to have a catheter placed for dialysis.  2. Hyperlipidemia, unspecified hyperlipidemia type Continue statin as ordered and reviewed, no changes at this time   Current Outpatient Medications on File Prior to Visit  Medication Sig Dispense Refill   albuterol  (VENTOLIN  HFA) 108 (90 Base)  MCG/ACT inhaler Inhale 1-2 puffs into the lungs every 6 (six) hours as needed for wheezing or shortness of breath. 20.1 g 3   apixaban  (ELIQUIS ) 5 MG TABS tablet Take 5 mg by mouth 2 (two) times daily.     hydrOXYzine  (ATARAX /VISTARIL ) 50 MG tablet Take 50 mg by mouth at bedtime.     levothyroxine  (SYNTHROID ) 75 MCG tablet Take 75 mcg by mouth daily before breakfast.      lidocaine -prilocaine  (EMLA ) cream Apply 1 Application topically once.     metoprolol  succinate (TOPROL -XL) 25 MG 24 hr tablet Take 1 tablet by mouth daily.     midodrine  (PROAMATINE ) 10 MG tablet Take 1 tablet (10 mg total) by mouth 3 (three) times daily. 90 tablet 0   mupirocin  ointment (BACTROBAN ) 2 % Apply small amount to the inside of both nostrils TWICE a day for the next 5 days. 15 g 0   pantoprazole  (PROTONIX ) 40 MG tablet Take 40 mg by mouth daily.     ranolazine  (RANEXA ) 1000 MG SR tablet Take 1,000 mg by mouth 2 (two) times daily.     rosuvastatin  (CRESTOR ) 20 MG tablet Take 20 mg by mouth at bedtime.     sevelamer  carbonate (RENVELA ) 800 MG tablet Take 3,200 mg by mouth 3 (three) times daily with meals.     triamcinolone  cream (KENALOG ) 0.1 % Apply 1 Application topically 2 (two) times daily. 453.6 g 2   Current Facility-Administered Medications on File Prior to Visit  Medication Dose Route Frequency Provider Last Rate Last Admin   heparin  injection    PRN Dew, Jason S, MD   10,000 Units at 04/08/23 1412    There are no Patient Instructions on file for this visit. No follow-ups on file.   Shalea Tomczak E Krishana Lutze, NP

## 2024-03-24 ENCOUNTER — Ambulatory Visit

## 2024-03-24 DIAGNOSIS — I5020 Unspecified systolic (congestive) heart failure: Secondary | ICD-10-CM

## 2024-03-24 DIAGNOSIS — I340 Nonrheumatic mitral (valve) insufficiency: Secondary | ICD-10-CM | POA: Diagnosis not present

## 2024-03-24 DIAGNOSIS — I361 Nonrheumatic tricuspid (valve) insufficiency: Secondary | ICD-10-CM | POA: Diagnosis not present

## 2024-03-24 DIAGNOSIS — Z951 Presence of aortocoronary bypass graft: Secondary | ICD-10-CM

## 2024-03-24 DIAGNOSIS — I480 Paroxysmal atrial fibrillation: Secondary | ICD-10-CM

## 2024-03-24 DIAGNOSIS — R0602 Shortness of breath: Secondary | ICD-10-CM

## 2024-03-24 DIAGNOSIS — I9589 Other hypotension: Secondary | ICD-10-CM

## 2024-03-24 DIAGNOSIS — I371 Nonrheumatic pulmonary valve insufficiency: Secondary | ICD-10-CM

## 2024-03-25 DIAGNOSIS — I509 Heart failure, unspecified: Secondary | ICD-10-CM | POA: Diagnosis not present

## 2024-03-27 ENCOUNTER — Telehealth (INDEPENDENT_AMBULATORY_CARE_PROVIDER_SITE_OTHER): Payer: Self-pay

## 2024-03-27 NOTE — Telephone Encounter (Signed)
 Spoke with the patient regarding being scheduled for a RUE angio with Dr. Marea. Patient is scheduled for 03/30/24 with a 1:30 pm arrival time to the [facility]. Pre-procedure instructions were discussed and will be mailed.

## 2024-03-28 ENCOUNTER — Ambulatory Visit: Admitting: Cardiovascular Disease

## 2024-03-30 ENCOUNTER — Other Ambulatory Visit: Payer: Self-pay

## 2024-03-30 ENCOUNTER — Ambulatory Visit: Admitting: Certified Registered"

## 2024-03-30 ENCOUNTER — Encounter: Admission: RE | Disposition: A | Payer: Self-pay | Source: Home / Self Care | Attending: Vascular Surgery

## 2024-03-30 ENCOUNTER — Encounter: Payer: Self-pay | Admitting: Vascular Surgery

## 2024-03-30 ENCOUNTER — Ambulatory Visit
Admission: RE | Admit: 2024-03-30 | Discharge: 2024-03-30 | Disposition: A | Discharge status: Home / Self Care | Attending: Vascular Surgery | Admitting: Vascular Surgery

## 2024-03-30 ENCOUNTER — Ambulatory Visit (INDEPENDENT_AMBULATORY_CARE_PROVIDER_SITE_OTHER): Admitting: Vascular Surgery

## 2024-03-30 DIAGNOSIS — F17210 Nicotine dependence, cigarettes, uncomplicated: Secondary | ICD-10-CM | POA: Diagnosis not present

## 2024-03-30 DIAGNOSIS — I132 Hypertensive heart and chronic kidney disease with heart failure and with stage 5 chronic kidney disease, or end stage renal disease: Secondary | ICD-10-CM | POA: Diagnosis not present

## 2024-03-30 DIAGNOSIS — I252 Old myocardial infarction: Secondary | ICD-10-CM | POA: Insufficient documentation

## 2024-03-30 DIAGNOSIS — I5042 Chronic combined systolic (congestive) and diastolic (congestive) heart failure: Secondary | ICD-10-CM | POA: Diagnosis not present

## 2024-03-30 DIAGNOSIS — F319 Bipolar disorder, unspecified: Secondary | ICD-10-CM | POA: Insufficient documentation

## 2024-03-30 DIAGNOSIS — E785 Hyperlipidemia, unspecified: Secondary | ICD-10-CM | POA: Insufficient documentation

## 2024-03-30 DIAGNOSIS — I5022 Chronic systolic (congestive) heart failure: Secondary | ICD-10-CM | POA: Diagnosis not present

## 2024-03-30 DIAGNOSIS — J449 Chronic obstructive pulmonary disease, unspecified: Secondary | ICD-10-CM | POA: Diagnosis not present

## 2024-03-30 DIAGNOSIS — K219 Gastro-esophageal reflux disease without esophagitis: Secondary | ICD-10-CM | POA: Diagnosis not present

## 2024-03-30 DIAGNOSIS — N186 End stage renal disease: Secondary | ICD-10-CM | POA: Diagnosis not present

## 2024-03-30 DIAGNOSIS — T82898A Other specified complication of vascular prosthetic devices, implants and grafts, initial encounter: Secondary | ICD-10-CM | POA: Diagnosis not present

## 2024-03-30 DIAGNOSIS — Z992 Dependence on renal dialysis: Secondary | ICD-10-CM

## 2024-03-30 DIAGNOSIS — Y832 Surgical operation with anastomosis, bypass or graft as the cause of abnormal reaction of the patient, or of later complication, without mention of misadventure at the time of the procedure: Secondary | ICD-10-CM | POA: Diagnosis not present

## 2024-03-30 DIAGNOSIS — I70208 Unspecified atherosclerosis of native arteries of extremities, other extremity: Secondary | ICD-10-CM

## 2024-03-30 DIAGNOSIS — I2510 Atherosclerotic heart disease of native coronary artery without angina pectoris: Secondary | ICD-10-CM | POA: Diagnosis not present

## 2024-03-30 DIAGNOSIS — Z8673 Personal history of transient ischemic attack (TIA), and cerebral infarction without residual deficits: Secondary | ICD-10-CM | POA: Diagnosis not present

## 2024-03-30 DIAGNOSIS — E1122 Type 2 diabetes mellitus with diabetic chronic kidney disease: Secondary | ICD-10-CM | POA: Diagnosis not present

## 2024-03-30 DIAGNOSIS — Z79899 Other long term (current) drug therapy: Secondary | ICD-10-CM | POA: Insufficient documentation

## 2024-03-30 HISTORY — PX: UPPER EXTREMITY ANGIOGRAPHY: CATH118270

## 2024-03-30 LAB — POTASSIUM (ARMC VASCULAR LAB ONLY): Potassium (ARMC vascular lab): 6.2 mmol/L — ABNORMAL HIGH (ref 3.5–5.1)

## 2024-03-30 LAB — POTASSIUM: Potassium: 5.7 mmol/L — ABNORMAL HIGH (ref 3.5–5.1)

## 2024-03-30 SURGERY — UPPER EXTREMITY ANGIOGRAPHY
Anesthesia: General | Laterality: Right

## 2024-03-30 MED ORDER — METHYLPREDNISOLONE SODIUM SUCC 125 MG IJ SOLR: 125.0000 mg | Freq: Once | INTRAMUSCULAR | Status: DC | PRN

## 2024-03-30 MED ORDER — MIDAZOLAM HCL 2 MG/ML PO SYRP: 8.0000 mg | ORAL_SOLUTION | Freq: Once | ORAL | Status: DC | PRN

## 2024-03-30 MED ORDER — PHENYLEPHRINE HCL-NACL 20-0.9 MG/250ML-% IV SOLN
INTRAVENOUS | Status: AC
  Filled 2024-03-30: qty 250

## 2024-03-30 MED ORDER — FENTANYL CITRATE (PF) 100 MCG/2ML IJ SOLN
INTRAMUSCULAR | Status: AC
  Filled 2024-03-30: qty 2

## 2024-03-30 MED ORDER — FENTANYL CITRATE (PF) 100 MCG/2ML IJ SOLN
INTRAMUSCULAR | Status: DC | PRN
  Administered 2024-03-30: 25 ug via INTRAVENOUS

## 2024-03-30 MED ORDER — VASOPRESSIN 20 UNIT/ML IV SOLN
INTRAVENOUS | Status: DC | PRN
  Administered 2024-03-30 (×2): 1 [IU] via INTRAVENOUS

## 2024-03-30 MED ORDER — MIDAZOLAM HCL 2 MG/2ML IJ SOLN
INTRAMUSCULAR | Status: AC
  Filled 2024-03-30: qty 2

## 2024-03-30 MED ORDER — LIDOCAINE-EPINEPHRINE (PF) 1 %-1:200000 IJ SOLN
INTRAMUSCULAR | Status: DC | PRN
  Administered 2024-03-30: 10 mL

## 2024-03-30 MED ORDER — CEFAZOLIN SODIUM-DEXTROSE 1-4 GM/50ML-% IV SOLN
INTRAVENOUS | Status: AC
Start: 2024-03-30 — End: 2024-03-30
  Filled 2024-03-30: qty 50

## 2024-03-30 MED ORDER — KETAMINE HCL 50 MG/5ML IJ SOSY
PREFILLED_SYRINGE | INTRAMUSCULAR | Status: DC | PRN
  Administered 2024-03-30: 5 mg via INTRAVENOUS
  Administered 2024-03-30 (×2): 10 mg via INTRAVENOUS

## 2024-03-30 MED ORDER — FAMOTIDINE 20 MG PO TABS: 40.0000 mg | ORAL_TABLET | Freq: Once | ORAL | Status: DC | PRN

## 2024-03-30 MED ORDER — IODIXANOL 320 MG/ML IV SOLN
INTRAVENOUS | Status: DC | PRN
  Administered 2024-03-30: 55 mL

## 2024-03-30 MED ORDER — HEPARIN SODIUM (PORCINE) 1000 UNIT/ML IJ SOLN
INTRAMUSCULAR | Status: DC | PRN
Start: 2024-03-30 — End: 2024-03-30
  Administered 2024-03-30: 4000 [IU] via INTRAVENOUS

## 2024-03-30 MED ORDER — CEFAZOLIN SODIUM-DEXTROSE 1-4 GM/50ML-% IV SOLN
1.0000 g | INTRAVENOUS | Status: AC
  Administered 2024-03-30: 2 g via INTRAVENOUS

## 2024-03-30 MED ORDER — VASOPRESSIN 20 UNIT/ML IV SOLN
INTRAVENOUS | Status: AC
  Filled 2024-03-30: qty 1

## 2024-03-30 MED ORDER — PROPOFOL 500 MG/50ML IV EMUL
INTRAVENOUS | Status: DC | PRN
  Administered 2024-03-30: 100 ug/kg/min via INTRAVENOUS

## 2024-03-30 MED ORDER — PHENYLEPHRINE 80 MCG/ML (10ML) SYRINGE FOR IV PUSH (FOR BLOOD PRESSURE SUPPORT)
PREFILLED_SYRINGE | INTRAVENOUS | Status: DC | PRN
  Administered 2024-03-30 (×2): 80 ug via INTRAVENOUS

## 2024-03-30 MED ORDER — MIDAZOLAM HCL (PF) 2 MG/2ML IJ SOLN
INTRAMUSCULAR | Status: DC | PRN
  Administered 2024-03-30: 2 mg via INTRAVENOUS

## 2024-03-30 MED ORDER — DIPHENHYDRAMINE HCL 50 MG/ML IJ SOLN: 50.0000 mg | Freq: Once | INTRAMUSCULAR | Status: DC | PRN

## 2024-03-30 MED ORDER — SODIUM CHLORIDE 0.9 % IV SOLN: INTRAVENOUS | Status: DC

## 2024-03-30 MED ORDER — PHENYLEPHRINE 80 MCG/ML (10ML) SYRINGE FOR IV PUSH (FOR BLOOD PRESSURE SUPPORT)
PREFILLED_SYRINGE | INTRAVENOUS | Status: AC
  Filled 2024-03-30: qty 10

## 2024-03-30 MED ORDER — KETAMINE HCL 50 MG/5ML IJ SOSY
PREFILLED_SYRINGE | INTRAMUSCULAR | Status: AC
  Filled 2024-03-30: qty 5

## 2024-03-30 MED ORDER — HEPARIN (PORCINE) IN NACL 2000-0.9 UNIT/L-% IV SOLN
INTRAVENOUS | Status: DC | PRN
  Administered 2024-03-30: 1000 mL

## 2024-03-30 MED ORDER — PHENYLEPHRINE HCL-NACL 20-0.9 MG/250ML-% IV SOLN
INTRAVENOUS | Status: DC | PRN
  Administered 2024-03-30: 25 ug/min via INTRAVENOUS

## 2024-03-30 MED ORDER — PROPOFOL 1000 MG/100ML IV EMUL
INTRAVENOUS | Status: AC
  Filled 2024-03-30: qty 100

## 2024-03-30 SURGICAL SUPPLY — 19 items
BALLOON LUTONIX 018 4X100X130 (BALLOONS) IMPLANT
BALLOON LUTONIX 018 5X100X130 (BALLOONS) IMPLANT
BALLOON ULTRVRSE 2.5X300X150 (BALLOONS) IMPLANT
BALLOON ULTRVRSE 3X150X150 (BALLOONS) IMPLANT
CATH ANGIO 5F PIGTAIL 100CM (CATHETERS) IMPLANT
CATH BEACON 5 .035 100 H1 TIP (CATHETERS) IMPLANT
CATH CXI SUPP ANG 4FR 135 (CATHETERS) IMPLANT
COVER PROBE ULTRASOUND 5X96 (MISCELLANEOUS) IMPLANT
DEVICE PRESTO INFLATION (MISCELLANEOUS) IMPLANT
DEVICE VASC CLSR CELT ART 6 (Vascular Products) IMPLANT
GLIDEWIRE ANGLED SS 035X260CM (WIRE) IMPLANT
SHEATH BRITE TIP 5FRX11 (SHEATH) IMPLANT
SHEATH BRITE TIP 6FRX11 (SHEATH) IMPLANT
SHEATH SHUTTLE SELECT 6F (SHEATH) IMPLANT
SYR MEDRAD MARK 7 150ML (SYRINGE) IMPLANT
TUBING CONTRAST HIGH PRESS 72 (TUBING) IMPLANT
WIRE G V18X300CM (WIRE) IMPLANT
WIRE J 3MM .035X145CM (WIRE) IMPLANT
WIRE SUPRACORE 300CM (WIRE) IMPLANT

## 2024-03-30 NOTE — Op Note (Signed)
 OPERATIVE REPORT     PREOPERATIVE DIAGNOSIS: 1. End-stage renal disease. 2. Steal syndrome, right arm with patent right brachial artery to axillary vein AV graft.   POSTOPERATIVE DIAGNOSIS: Same as above but she has a high brachial bifurcation and this is actually an ulnar artery to axillary vein AV graft   PROCEDURE PERFORMED: 1. Ultrasound guidance vascular access to right femoral artery. 2. Catheter placement to right radial artery and right ulnar arteries     from right femoral approach. 3. Thoracic aortogram and selective right upper extremity angiogram     including selective images of the radial and ulnar arteries. 4.  Percutaneous transluminal angioplasty of the right radial artery with 2.5 mm diameter by 30 cm angioplasty balloon and 3 cm x 15 cm length angioplasty balloon 5.  Percutaneous transluminal angioplasty of the right ulnar artery (analogous to a typical brachial artery location at the antecubital fossa) with 4 mm diameter and 5 mm diameter by 10 cm length Lutonix drug-coated angioplasty balloons 6. Celt closure device right femoral artery.   SURGEON:  Selinda GORMAN Gu, MD   ANESTHESIA:  MAC   BLOOD LOSS:  Minimal.   FLUOROSCOPY TIME: 5.2 minutes   INDICATION FOR PROCEDURE:  This is a 63 y.o.female who presented to our office with steal syndrome.  The patient's right arm AV graft is working well, but their hand is numb and painful.  To further evaluate this to determine what options would be possible to treat the steal syndrome, angiogram of the left upper extremity is indicated.  Risks and benefits are discussed.  Informed consent was obtained.   DESCRIPTION OF PROCEDURE:  The patient was brought to the vascular suite.  Moderate conscious sedation was administered during a face to face encounter with the patient throughout the procedure with my supervision of the RN administering medicines and monitoring the patient's vital signs, pulse oximetry, telemetry and mental  status throughout from the start of the procedure until the patient was taken to the recovery room.  Groins were shaved and prepped and sterile surgical field was created.  The right femoral head was localized with fluoroscopy and the right femoral artery was then visualized with ultrasound and found to be widely patent.  It was then accessed under direct ultrasound guidance without difficulty with a Seldinger needle and a permanent image was recorded.  A J-wire and 5-French sheath were then placed.  Pigtail catheter was placed into the ascending aorta and a thoracic aortogram was then performed in the LAO projection. This demonstrated normal origins to the great vessels without significant proximal stenoses and a normal configuration of the great vessels.  The patient was given 4000 units of intravenous heparin  and a headhunter catheter was used to selectively cannulate the innominate artery within the right subclavian artery without difficulty.  This was then sequentially advanced to the brachial artery.  Steal was demonstrated with all of the flow from the artery going into the fistula and not downstream to the hand on images with the catheter proximal to the access.  On imaging, she actually had a very high brachial bifurcation in the proximal to mid upper arm.  The graft was actually an ulnar artery to axillary vein AV graft.  The radial artery came off separate but was small and diseased with a medium to long segment occlusion in the forearm but there was reconstitution at the wrist.  There was significant narrowing of the ulnar artery at the anastomosis with flow going out the graft  and nothing going downstream initially.  I initially then got a CXI catheter and a V18 wire and got out the radial artery.  I was able to cross the well and radial artery occlusion after selective imaging of the radial artery with CXI catheter demonstrated the occlusion in the forearm.  I then used a 2.5 mm diameter by  30 cm length angioplasty balloon inflated the radial artery from the wrist all the way back up to the antecubital fossa.  A 3 mm diameter by 15 cm length angioplasty balloon was then used to treat the more proximal radial artery near the antecubital fossa.  Each of these inflations were 10 atm for 1 minute.  Completion imaging showed markedly improved flow now with inline flow through the radial artery into the hand which was a separate circuit from the ulnar artery to axillary vein AV graft which should markedly improve her steal.  I then pulled the CXI catheter back and cannulated the ulnar artery in the proximal to mid upper arm and then crossed the anastomosis to the AV graft.  Selective imaging showed a fairly high-grade stenosis of greater than 80% in the perianastomotic ulnar artery.  I then treated this artery with angioplasty first with a 4 mm diameter by 10 cm length Lutonix drug-coated angioplasty balloon inflated to 10 atm and then with a 5 mm diameter Lutonix drug-coated angioplasty balloon inflated to 6 atm.  There was still sluggish flow beyond the access but now there was more brisk flow into the access and the area of stenosis appeared to be less than 20%. The diagnostic catheter was removed.  Oblique arteriogram was performed of the right femoral artery and Celt closure device deployed in the usual fashion with excellent hemostatic result. The patient tolerated the procedure well and was taken to the recovery room in stable condition.    Selinda Gu 03/30/2024 3:17 PM

## 2024-03-30 NOTE — Anesthesia Preprocedure Evaluation (Addendum)
 Anesthesia Evaluation  Patient identified by MRN, date of birth, ID band Patient awake    Reviewed: Allergy & Precautions, NPO status , Patient's Chart, lab work & pertinent test results  Airway Mallampati: II  TM Distance: >3 FB Neck ROM: full    Dental  (+) Teeth Intact   Pulmonary shortness of breath and with exertion, COPD,  COPD inhaler, Current Smoker and Patient abstained from smoking.   Pulmonary exam normal  + decreased breath sounds      Cardiovascular hypertension, Pt. on medications + CAD, + Past MI and +CHF  + dysrhythmias Atrial Fibrillation  Rhythm:Regular Rate:Normal     Neuro/Psych     Bipolar Disorder   CVA, No Residual Symptoms negative neurological ROS  negative psych ROS   GI/Hepatic negative GI ROS,GERD  Medicated,,(+) Hepatitis -, C  Endo/Other  negative endocrine ROSHypothyroidism    Renal/GU DialysisRenal disease     Musculoskeletal   Abdominal  (+) + obese  Peds  Hematology negative hematology ROS (+)   Anesthesia Other Findings Past Medical History: 03/03/2014: Acute lacunar infarction [facility])     Comment:  a.) MRI brain 03/03/2014: two acute punctate lacunar               infarcts anteriorly in the BILATERAL centrum semiovale No date: Adult behavior problems No date: Anemia of chronic renal failure No date: Aortic atherosclerosis No date: Arthritis No date: Atrial fibrillation and flutter (HCC)     Comment:  a.) CHA2DS2VASc = 6 (sex, HFrEF, HTN, CVA x 2, prior               MI/vascular disease) as of 02/04/2024; b.) rate/rhythm               maintained on oral metoprolol  succinate; chronically               anticoagulated with apixaban  No date: Bipolar disorder (HCC) No date: Cardiomegaly No date: Cerebral microvascular disease No date: CHF (congestive heart failure) (HCC) No date: Chicken pox No date: Cholelithiasis 12/19/2021: Chronic respiratory failure with hypoxia (HCC) No  date: COPD (chronic obstructive pulmonary disease) (HCC) No date: Coronary artery disease No date: DDD (degenerative disc disease), cervical No date: DDD (degenerative disc disease), thoracolumbar No date: Diverticulosis No date: Dyspnea No date: ESRD (end stage renal disease) on dialysis (M,W,F) No date: GERD (gastroesophageal reflux disease) No date: HCV (hepatitis C virus) No date: Headache No date: HFrEF (heart failure with reduced ejection fraction) (HCC) 04/12/2014: History of delirium No date: History of substance abuse (HCC)     Comment:  a.) tobacco + cocaine 11/13/2014: Hyperkalemia No date: Hyperlipidemia No date: Hypertension 02/24/2014: Hypotension No date: Hypothyroidism No date: Ischemic cardiomyopathy 02/01/2024: Nose colonized with MRSA     Comment:  a.) presurgical PCR (+) on 02/01/2024 prior to A/V               FISTULAGRAM; b.) presurgical PCR (+) on 03/07/2024 prior               to AV FISTULA CREATION 10/08/2013: NSTEMI (non-ST elevated myocardial infarction) (HCC) 01/23/2002: NSTEMI (non-ST elevated myocardial infarction) (HCC)     Comment:  a.) LHC 01/24/2002 --> multi-vessel CAD with IRA being               90% OM2 --> delayed PCI until 01/25/2002 --> 3.0 x 23 mm               BX Velocity stent No date: Obesity No  date: 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 No date: On apixaban  therapy No date: Peripheral vascular disease No date: Pneumonia 02/27/2014: Postoperative anemia due to acute blood loss 02/21/2014: Postoperative cerebrovascular infarction following  cardiac surgery     Comment:  a.) developed RIGHT upper/lower extremity weakness               following cardiac revascularization --> neuro consult and              head CT --> Hypodensity noted in the area of the LEFT               motor cortex consistent with a component of acute               ischemia No date: Pulmonary hypertension (HCC) No date: RBBB (right bundle branch  block) No date: Renal osteodystrophy 02/20/2014: S/P CABG x 3     Comment:  a.) LIMA-LAD, SVG-OM1, SVG-PDA 05/23/2010: Stroke (HCC)     Comment:  a.) brain MRI 05/23/2010: BILATERAL acute infarcts and               old prior infarcts; RIGHT frontal lobe extending into the              white matter. Small focus of restricted diffusion in the               cortex of the medial LEFT frontoparietal lobe.               Periventricular T2 and multiple foci of T2 hyperintensity              in the subcortical white matter and increased FLAIR               signal in the cortex of the LEFT frontal lobe. 07/25/2020: Syncope and collapse  Past Surgical History: 03/30/2019: A/V FISTULAGRAM; Left     Comment:  Procedure: A/V FISTULAGRAM;  Surgeon: Marea Selinda RAMAN, MD;               Location: [facility];  Service: Cardiovascular;              Laterality: Left; 10/06/2019: A/V FISTULAGRAM; Left     Comment:  Procedure: A/V FISTULAGRAM;  Surgeon: Marea Selinda RAMAN, MD;               Location: [facility];  Service: Cardiovascular;              Laterality: Left; 05/19/2021: A/V FISTULAGRAM; Left     Comment:  Procedure: A/V FISTULAGRAM;  Surgeon: Marea Selinda RAMAN, MD;               Location: [facility];  Service: Cardiovascular;              Laterality: Left; 01/29/2022: A/V FISTULAGRAM; Left     Comment:  Procedure: A/V Fistulagram;  Surgeon: Marea Selinda RAMAN, MD;               Location: [facility];  Service: Cardiovascular;              Laterality: Left; 04/30/2022: A/V FISTULAGRAM; Left     Comment:  Procedure: A/V Fistulagram;  Surgeon: Marea Selinda RAMAN, MD;               Location: [facility];  Service: Cardiovascular;              Laterality: Left;  08/20/2022: A/V FISTULAGRAM; Left     Comment:  Procedure: A/V Fistulagram;  Surgeon: Marea Selinda RAMAN, MD;               Location: [facility];  Service: Cardiovascular;              Laterality:  Left; 12/03/2022: A/V FISTULAGRAM; N/A     Comment:  Procedure: A/V Fistulagram;  Surgeon: Marea Selinda RAMAN, MD;               Location: [facility];  Service: Cardiovascular;              Laterality: N/A; 02/11/2023: A/V FISTULAGRAM; Left     Comment:  Procedure: A/V Fistulagram;  Surgeon: Marea Selinda RAMAN, MD;               Location: [facility];  Service: Cardiovascular;              Laterality: Left; 02/07/2024: A/V FISTULAGRAM; Left     Comment:  Procedure: A/V Fistulagram;  Surgeon: Marea Selinda RAMAN, MD;               Location: [facility];  Service: Cardiovascular;              Laterality: Left; No date: COLONOSCOPY 04/22/2021: COLONOSCOPY WITH PROPOFOL ; N/A     Comment:  Procedure: COLONOSCOPY WITH PROPOFOL ;  Surgeon:               Maryruth Ole DASEN, MD;  Location: [facility];                Service: Endoscopy;  Laterality: N/A; 01/25/2002: CORONARY ANGIOPLASTY WITH STENT PLACEMENT; Left     Comment:  Procedure: CORONARY ANGIOPLASTY WITH STENT PLACEMENT;               Location: [facility]; Surgeon: Margie Lovelace, MD 02/20/2014: CORONARY ARTERY BYPASS GRAFT; N/A     Comment:  Procedure: CORONARY ARTERY BYPASS GRAFT; Location: [facility];              Surgeon: Reyes Fruits, MD No date: DIALYSIS FISTULA CREATION 04/08/2023: DIALYSIS/PERMA CATHETER INSERTION; N/A     Comment:  Procedure: DIALYSIS/PERMA CATHETER INSERTION;  Surgeon:               Marea Selinda RAMAN, MD;  Location: [facility];                Service: Cardiovascular;  Laterality: N/A; 07/06/2023: DIALYSIS/PERMA CATHETER REMOVAL; N/A     Comment:  Procedure: DIALYSIS/PERMA CATHETER REMOVAL;  Surgeon:               [name] Cordella MATSU, MD;  Location: [facility];               Service: Cardiovascular;  Laterality: N/A; No date: ESOPHAGOGASTRODUODENOSCOPY 04/22/2021: ESOPHAGOGASTRODUODENOSCOPY; N/A     Comment:  Procedure: ESOPHAGOGASTRODUODENOSCOPY (EGD);  Surgeon:               Maryruth Ole DASEN, MD;  Location: [facility];                Service: Endoscopy;  Laterality: N/A; 02/23/2019: FLEXIBLE SIGMOIDOSCOPY; N/A     Comment:  Procedure: FLEXIBLE SIGMOIDOSCOPY;  Surgeon: Gaylyn Gladis PENNER, MD;  Location: [facility];  Service:  Endoscopy;  Laterality: N/A; 02/20/2014: IABP INSERTION     Comment:  Procedure: INTRA-AORTIC BALLOON PUMP INSERTION;               Location: [facility]; Surgeon: Reyes Fruits, MD 03/09/2024: INSERTION OF ARTERIOVENOUS (AV) ARTEGRAFT ARM; Right     Comment:  Procedure: INSERTION, GRAFT, ARTERIOVENOUS, UPPER               EXTREMITY;  Surgeon: Marea Selinda RAMAN, MD;  Location: [facility]               ORS;  Service: Vascular;  Laterality: Right;  BRACHIAL               AXILLARY 01/24/2002: LEFT HEART CATH AND CORONARY ANGIOGRAPHY; Left     Comment:  Procedure: LEFT HEART CATH AND CORONARY ANGIOGRAPHY;               Location: [facility]; Surgeon: Margie Lovelace, MD 05/25/2012: LEFT HEART CATH AND CORONARY ANGIOGRAPHY; Left     Comment:  Procedure: LEFT HEART CATH AND CORONARY ANGIOGRAPHY;               Location: [facility]; Surgeon: Margie Lovelace, MD 10/09/2013: LEFT HEART CATH AND CORONARY ANGIOGRAPHY; Left     Comment:  Procedure: LEFT HEART CATH AND CORONARY ANGIOGRAPHY;               Location: [facility]; Surgeon: Vinie Jude, MD 08/08/2018: LEFT HEART CATH AND CORS/GRAFTS ANGIOGRAPHY; N/A     Comment:  Procedure: LEFT HEART CATH AND CORS/GRAFTS ANGIOGRAPHY;               Surgeon: Fernand Denyse LABOR, MD;  Location: [facility];  Service: Cardiovascular;  Laterality: N/A; 01/30/2019: LEFT HEART CATH AND CORS/GRAFTS ANGIOGRAPHY; N/A     Comment:  Procedure: LEFT HEART CATH AND CORS/GRAFTS ANGIOGRAPHY;               Surgeon: Fernand Denyse LABOR, MD;  Location: [facility];  Service: Cardiovascular;  Laterality: N/A; No date: ultrasound guided pericardiocentesis     Reproductive/Obstetrics negative OB  ROS                              Anesthesia Physical Anesthesia Plan  ASA: 4  Anesthesia Plan: General   Post-op Pain Management:    Induction: Intravenous  PONV Risk Score and Plan: Ondansetron , Dexamethasone , Midazolam  and Treatment may vary due to age or medical condition  Airway Management Planned: Natural Airway and Nasal Cannula  Additional Equipment:   Intra-op Plan:   Post-operative Plan:   Informed Consent: I have reviewed the patients History and Physical, chart, labs and discussed the procedure including the risks, benefits and alternatives for the proposed anesthesia with the patient or authorized representative who has indicated his/her understanding and acceptance.     Dental Advisory Given  Plan Discussed with: CRNA  Anesthesia Plan Comments:          Anesthesia Quick Evaluation

## 2024-03-30 NOTE — Transfer of Care (Signed)
 Immediate Anesthesia Transfer of Care Note  Patient: [name]  Procedure(s) Performed: Upper Extremity Angiography (Right)  Patient Location: PACU  Anesthesia Type:General  Level of Consciousness: drowsy  Airway & Oxygen  Therapy: Patient Spontanous Breathing and Patient connected to face mask oxygen   Post-op Assessment: Report given to RN and Post -op Vital signs reviewed and stable  Post vital signs: Reviewed and stable  Last Vitals:  Vitals Value Taken Time  BP 95/69 03/30/24 15:35  Temp    Pulse 107 03/30/24 15:43  Resp 13 03/30/24 15:43  SpO2 99 % 03/30/24 15:43  Vitals shown include unfiled device data.  Last Pain:  Vitals:   03/30/24 1038  TempSrc: Oral  PainSc: 8          Complications: There were no known notable events for this encounter.

## 2024-03-30 NOTE — Interval H&P Note (Signed)
 History and Physical Interval Note:  03/30/2024 10:57 AM  [name]  has presented today for surgery, with the diagnosis of RUE Angio   ANESTHESIA    End Stage Renal.  The various methods of treatment have been discussed with the patient and family. After consideration of risks, benefits and other options for treatment, the patient has consented to  Procedure(s): Upper Extremity Angiography (Right) as a surgical intervention.  The patient's history has been reviewed, patient examined, no change in status, stable for surgery.  I have reviewed the patient's chart and labs.  Questions were answered to the patient's satisfaction.     Deunte Bledsoe

## 2024-03-30 NOTE — Anesthesia Postprocedure Evaluation (Signed)
 Anesthesia Post Note  Patient: [name]  Procedure(s) Performed: Upper Extremity Angiography (Right)  Patient location during evaluation: PACU Anesthesia Type: General Level of consciousness: awake and alert Pain management: pain level controlled Vital Signs Assessment: post-procedure vital signs reviewed and stable Respiratory status: spontaneous breathing, nonlabored ventilation and respiratory function stable Cardiovascular status: blood pressure returned to baseline, stable and tachycardic Postop Assessment: no apparent nausea or vomiting Anesthetic complications: no   There were no known notable events for this encounter.   Last Vitals:  Vitals:   03/30/24 1635 03/30/24 1645  BP: 103/75 107/62  Pulse: (!) 104 (!) 105  Resp: 15 16  Temp:  36.4 C  SpO2: 95% 95%    Last Pain:  Vitals:   03/30/24 1645  TempSrc: Axillary  PainSc: 0-No pain                 Camellia Merilee Louder

## 2024-03-31 ENCOUNTER — Telehealth (INDEPENDENT_AMBULATORY_CARE_PROVIDER_SITE_OTHER): Payer: Self-pay | Admitting: Nurse Practitioner

## 2024-03-31 ENCOUNTER — Encounter: Payer: Self-pay | Admitting: Vascular Surgery

## 2024-03-31 NOTE — Telephone Encounter (Signed)
 LVM for pt TCB and schedule procedure follow up  Follow-Ups: Follow up with Brown, Fallon E, NP (Vascular Surgery) in 2 weeks (04/13/2024); with steal study right arm - need Tues, Thurs appt due to dialysis

## 2024-04-03 ENCOUNTER — Telehealth (INDEPENDENT_AMBULATORY_CARE_PROVIDER_SITE_OTHER): Payer: Self-pay

## 2024-04-03 NOTE — Telephone Encounter (Signed)
 Patient called in stating her right arm is hurting pain spreading from hand to her arm at a level 11 out of 10, pt notes arm is not red and was warm to touch over the weekend but not today. Pain began on Friday and has intensified, feels numb is hard to move fingers, no swelling noted. Patient stated she has pain medication but she does not want to take it but has had to take it. Patient reports she is open to coming in if need be but is only available on Tue/ Thurs Mornings, advised her I would forward this information to her for further instruction.

## 2024-04-05 NOTE — Telephone Encounter (Signed)
 Let's see about getting her in tomorrow

## 2024-04-06 ENCOUNTER — Encounter (INDEPENDENT_AMBULATORY_CARE_PROVIDER_SITE_OTHER): Payer: Self-pay | Admitting: Nurse Practitioner

## 2024-04-06 ENCOUNTER — Ambulatory Visit (INDEPENDENT_AMBULATORY_CARE_PROVIDER_SITE_OTHER): Admitting: Nurse Practitioner

## 2024-04-06 VITALS — BP 138/100 | HR 112 | Resp 18 | Ht 66.0 in | Wt 161.2 lb

## 2024-04-06 DIAGNOSIS — I10 Essential (primary) hypertension: Secondary | ICD-10-CM

## 2024-04-06 DIAGNOSIS — N186 End stage renal disease: Secondary | ICD-10-CM

## 2024-04-06 DIAGNOSIS — E785 Hyperlipidemia, unspecified: Secondary | ICD-10-CM

## 2024-04-06 MED ORDER — OXYCODONE-ACETAMINOPHEN 5-325 MG PO TABS: 1.0000 | ORAL_TABLET | Freq: Four times a day (QID) | ORAL | 0 refills | Status: DC | PRN

## 2024-04-09 NOTE — Progress Notes (Signed)
 Subjective:    Patient ID: [name], female    DOB: October 06, 1960, 63 y.o.   MRN: [id number] Chief Complaint  Patient presents with   Follow-up    HDA R arm/continuous pain     The patient returns today following right upper extremity angiogram on 03/30/2024 which included:  PROCEDURE PERFORMED: 1. Ultrasound guidance vascular access to right femoral artery. 2. Catheter placement to right radial artery and right ulnar arteries     from right femoral approach. 3. Thoracic aortogram and selective right upper extremity angiogram     including selective images of the radial and ulnar arteries. 4.  Percutaneous transluminal angioplasty of the right radial artery with 2.5 mm diameter by 30 cm angioplasty balloon and 3 cm x 15 cm length angioplasty balloon 5.  Percutaneous transluminal angioplasty of the right ulnar artery (analogous to a typical brachial artery location at the antecubital fossa) with 4 mm diameter and 5 mm diameter by 10 cm length Lutonix drug-coated angioplasty balloons 6. Celt closure device right femoral artery.   She notes that she continues to have significant pain in her right upper extremity.  She continues to have numbness while on dialysis.  She does note that her hand is warm compared to previous assessment but she continues to have significant pain and it tends to be worse at night.  She denies any swelling of the upper extremity.  She also endorses having difficulty with use of her right hand.  While she can move it she cannot grip anything with her right hand.    Review of Systems  Neurological:  Positive for numbness.  All other systems reviewed and are negative.      Objective:   Physical Exam Vitals reviewed.  HENT:     Head: Normocephalic.  Cardiovascular:     Rate and Rhythm: Normal rate.     Pulses:          Radial pulses are 1+ on the right side.  Pulmonary:     Effort: Pulmonary effort is normal.  Neurological:     Mental Status: She  is alert and oriented to person, place, and time.     Motor: Weakness present.  Psychiatric:        Mood and Affect: Mood normal.        Behavior: Behavior normal.        Thought Content: Thought content normal.        Judgment: Judgment normal.     BP (!) 138/100 (BP Location: Right Leg, Patient Position: Sitting, Cuff Size: Small) Comment: patient in pain and was irritated on phone in waiting room  Pulse (!) 112   Resp 18   Ht 5' 6 (1.676 m)   Wt 161 lb 3.2 oz (73.1 kg)   BMI 26.02 kg/m   Past Medical History:  Diagnosis Date   Acute lacunar infarction (HCC) 03/03/2014   a.) MRI brain 03/03/2014: two acute punctate lacunar infarcts anteriorly in the BILATERAL centrum semiovale   Adult behavior problems    Anemia of chronic renal failure    Aortic atherosclerosis    Arthritis    Atrial fibrillation and flutter (HCC)    a.) CHA2DS2VASc = 6 (sex, HFrEF, HTN, CVA x 2, prior MI/vascular disease) as of 02/04/2024; b.) rate/rhythm maintained on oral metoprolol  succinate; chronically anticoagulated with apixaban    Bipolar disorder (HCC)    Cardiomegaly    Cerebral microvascular disease    CHF (congestive heart failure) (HCC)  Chicken pox    Cholelithiasis    Chronic respiratory failure with hypoxia (HCC) 12/19/2021   COPD (chronic obstructive pulmonary disease) (HCC)    Coronary artery disease    DDD (degenerative disc disease), cervical    DDD (degenerative disc disease), thoracolumbar    Diverticulosis    Dyspnea    ESRD (end stage renal disease) on dialysis (M,W,F)    GERD (gastroesophageal reflux disease)    HCV (hepatitis C virus)    Headache    HFrEF (heart failure with reduced ejection fraction) (HCC)    History of delirium 04/12/2014   History of substance abuse (HCC)    a.) tobacco + cocaine   Hyperkalemia 11/13/2014   Hyperlipidemia    Hypertension    Hypotension 02/24/2014   Hypothyroidism    Ischemic cardiomyopathy    Nose colonized with MRSA 02/01/2024    a.) presurgical PCR (+) on 02/01/2024 prior to A/V FISTULAGRAM; b.) presurgical PCR (+) on 03/07/2024 prior to AV FISTULA CREATION   NSTEMI (non-ST elevated myocardial infarction) (HCC) 10/08/2013   NSTEMI (non-ST elevated myocardial infarction) (HCC) 01/23/2002   a.) LHC 01/24/2002 --> multi-vessel CAD with IRA being 90% OM2 --> delayed PCI until 01/25/2002 --> 3.0 x 23 mm BX Velocity stent   Obesity    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    On apixaban  therapy    Peripheral vascular disease    Pneumonia    Postoperative anemia due to acute blood loss 02/27/2014   Postoperative cerebrovascular infarction following cardiac surgery 02/21/2014   a.) developed RIGHT upper/lower extremity weakness following cardiac revascularization --> neuro consult and head CT --> Hypodensity noted in the area of the LEFT motor cortex consistent with a component of acute ischemia   Pulmonary hypertension (HCC)    RBBB (right bundle branch block)    Renal osteodystrophy    S/P CABG x 3 02/20/2014   a.) LIMA-LAD, SVG-OM1, SVG-PDA   Stroke (HCC) 05/23/2010   a.) brain MRI 05/23/2010: BILATERAL acute infarcts and old prior infarcts; RIGHT frontal lobe extending into the white matter. Small focus of restricted diffusion in the cortex of the medial LEFT frontoparietal lobe. Periventricular T2 and multiple foci of T2 hyperintensity in the subcortical white matter and increased FLAIR signal in the cortex of the LEFT frontal lobe.   Syncope and collapse 07/25/2020    Social History   Socioeconomic History   Marital status: Single    Spouse name: Not on file   Number of children: Not on file   Years of education: Not on file   Highest education level: Not on file  Occupational History   Not on file  Tobacco Use   Smoking status: Every Day    Current packs/day: 0.25    Average packs/day: 0.3 packs/day for 20.0 years (5.0 ttl pk-yrs)    Types: Cigarettes   Smokeless tobacco: Never   Tobacco  comments:    smokes 1 - 1.5 cigarettes a day  Vaping Use   Vaping status: Never Used  Substance and Sexual Activity   Alcohol use: No    Comment: social   Drug use: No   Sexual activity: Not Currently    Birth control/protection: Post-menopausal  Other Topics Concern   Not on file  Social History Narrative   Lives alone   Social Drivers of Health   Financial Resource Strain: Not on file  Food Insecurity: No Food Insecurity (05/18/2023)   Hunger Vital Sign    Worried About Running Out  of Food in the Last Year: Never true    Ran Out of Food in the Last Year: Never true  Transportation Needs: No Transportation Needs (05/18/2023)   PRAPARE - Administrator, Civil Service (Medical): No    Lack of Transportation (Non-Medical): No  Physical Activity: Not on file  Stress: Not on file  Social Connections: Not on file  Intimate Partner Violence: Not At Risk (05/18/2023)   Humiliation, Afraid, Rape, and Kick questionnaire    Fear of Current or Ex-Partner: No    Emotionally Abused: No    Physically Abused: No    Sexually Abused: No    Past Surgical History:  Procedure Laterality Date   A/V FISTULAGRAM Left 03/30/2019   Procedure: A/V FISTULAGRAM;  Surgeon: Marea Selinda RAMAN, MD;  Location: [facility];  Service: Cardiovascular;  Laterality: Left;   A/V FISTULAGRAM Left 10/06/2019   Procedure: A/V FISTULAGRAM;  Surgeon: Marea Selinda RAMAN, MD;  Location: [facility];  Service: Cardiovascular;  Laterality: Left;   A/V FISTULAGRAM Left 05/19/2021   Procedure: A/V FISTULAGRAM;  Surgeon: Marea Selinda RAMAN, MD;  Location: [facility];  Service: Cardiovascular;  Laterality: Left;   A/V FISTULAGRAM Left 01/29/2022   Procedure: A/V Fistulagram;  Surgeon: Marea Selinda RAMAN, MD;  Location: [facility];  Service: Cardiovascular;  Laterality: Left;   A/V FISTULAGRAM Left 04/30/2022   Procedure: A/V Fistulagram;  Surgeon: Marea Selinda RAMAN, MD;  Location: [facility];   Service: Cardiovascular;  Laterality: Left;   A/V FISTULAGRAM Left 08/20/2022   Procedure: A/V Fistulagram;  Surgeon: Marea Selinda RAMAN, MD;  Location: [facility];  Service: Cardiovascular;  Laterality: Left;   A/V FISTULAGRAM N/A 12/03/2022   Procedure: A/V Fistulagram;  Surgeon: Marea Selinda RAMAN, MD;  Location: [facility];  Service: Cardiovascular;  Laterality: N/A;   A/V FISTULAGRAM Left 02/11/2023   Procedure: A/V Fistulagram;  Surgeon: Marea Selinda RAMAN, MD;  Location: [facility];  Service: Cardiovascular;  Laterality: Left;   A/V FISTULAGRAM Left 02/07/2024   Procedure: A/V Fistulagram;  Surgeon: Marea Selinda RAMAN, MD;  Location: [facility];  Service: Cardiovascular;  Laterality: Left;   COLONOSCOPY     COLONOSCOPY WITH PROPOFOL  N/A 04/22/2021   Procedure: COLONOSCOPY WITH PROPOFOL ;  Surgeon: Maryruth Ole DASEN, MD;  Location: [facility];  Service: Endoscopy;  Laterality: N/A;   CORONARY ANGIOPLASTY WITH STENT PLACEMENT Left 01/25/2002   Procedure: CORONARY ANGIOPLASTY WITH STENT PLACEMENT; Location: [facility]; Surgeon: Margie Lovelace, MD   CORONARY ARTERY BYPASS GRAFT N/A 02/20/2014   Procedure: CORONARY ARTERY BYPASS GRAFT; Location: [facility]; Surgeon: Reyes Fruits, MD   DIALYSIS FISTULA CREATION     DIALYSIS/PERMA CATHETER INSERTION N/A 04/08/2023   Procedure: DIALYSIS/PERMA CATHETER INSERTION;  Surgeon: Marea Selinda RAMAN, MD;  Location: [facility];  Service: Cardiovascular;  Laterality: N/A;   DIALYSIS/PERMA CATHETER REMOVAL N/A 07/06/2023   Procedure: DIALYSIS/PERMA CATHETER REMOVAL;  Surgeon: [name] Cordella MATSU, MD;  Location: [facility];  Service: Cardiovascular;  Laterality: N/A;   ESOPHAGOGASTRODUODENOSCOPY     ESOPHAGOGASTRODUODENOSCOPY N/A 04/22/2021   Procedure: ESOPHAGOGASTRODUODENOSCOPY (EGD);  Surgeon: Maryruth Ole DASEN, MD;  Location: [facility];  Service: Endoscopy;  Laterality: N/A;   FLEXIBLE SIGMOIDOSCOPY N/A 02/23/2019    Procedure: FLEXIBLE SIGMOIDOSCOPY;  Surgeon: Gaylyn Gladis PENNER, MD;  Location: [facility];  Service: Endoscopy;  Laterality: N/A;   IABP INSERTION  02/20/2014   Procedure: INTRA-AORTIC BALLOON PUMP INSERTION;  Location: [facility]; Surgeon: Reyes Fruits, MD   INSERTION OF ARTERIOVENOUS (AV) ARTEGRAFT ARM Right 03/09/2024   Procedure: INSERTION, GRAFT, ARTERIOVENOUS, UPPER EXTREMITY;  Surgeon: Marea Selinda RAMAN, MD;  Location: [facility];  Service: Vascular;  Laterality: Right;  BRACHIAL AXILLARY   LEFT HEART CATH AND CORONARY ANGIOGRAPHY Left 01/24/2002   Procedure: LEFT HEART CATH AND CORONARY ANGIOGRAPHY; Location: [facility]; Surgeon: Margie Lovelace, MD   LEFT HEART CATH AND CORONARY ANGIOGRAPHY Left 05/25/2012   Procedure: LEFT HEART CATH AND CORONARY ANGIOGRAPHY; Location: [facility]; Surgeon: Margie Lovelace, MD   LEFT HEART CATH AND CORONARY ANGIOGRAPHY Left 10/09/2013   Procedure: LEFT HEART CATH AND CORONARY ANGIOGRAPHY; Location: [facility]; Surgeon: Vinie Jude, MD   LEFT HEART CATH AND CORS/GRAFTS ANGIOGRAPHY N/A 08/08/2018   Procedure: LEFT HEART CATH AND CORS/GRAFTS ANGIOGRAPHY;  Surgeon: Fernand Denyse LABOR, MD;  Location: [facility];  Service: Cardiovascular;  Laterality: N/A;   LEFT HEART CATH AND CORS/GRAFTS ANGIOGRAPHY N/A 01/30/2019   Procedure: LEFT HEART CATH AND CORS/GRAFTS ANGIOGRAPHY;  Surgeon: Fernand Denyse LABOR, MD;  Location: [facility];  Service: Cardiovascular;  Laterality: N/A;   ultrasound guided pericardiocentesis     UPPER EXTREMITY ANGIOGRAPHY Right 03/30/2024   Procedure: Upper Extremity Angiography;  Surgeon: Marea Selinda RAMAN, MD;  Location: [facility];  Service: Cardiovascular;  Laterality: Right;    Family History  Problem Relation Age of Onset   Hypertension Mother    Ovarian cancer Mother    Diabetes type II Mother    Hypertension Father    Kidney disease Father    Hypertension Sister    Diabetes type II Maternal Grandmother    Breast cancer Maternal  Grandmother    Breast cancer Maternal Aunt    Hypertension Other    Cancer Other    Renal Disease Other     Allergies  Allergen Reactions   Morphine  And Codeine Hives   Levaquin [Levofloxacin] Itching and Other (See Comments)    Severe itching; prickly sensation; severe left leg pain; immobility.   Enalapril  Other (See Comments)    hallucinations   Levothyroxine      Immense pain in left hip area    Latex Rash   Tape Rash and Other (See Comments)       Latest Ref Rng & Units 03/09/2024    6:51 AM 03/07/2024   11:11 AM 02/01/2024   10:26 AM  CBC  WBC 4.0 - 10.5 K/uL  6.4  4.9   Hemoglobin 12.0 - 15.0 g/dL 84.9  85.6  86.4   Hematocrit 36.0 - 46.0 % 44.0  44.9  41.2   Platelets 150 - 400 K/uL  146  116       CMP     Component Value Date/Time   NA 132 (L) 03/09/2024 0651   NA 139 07/09/2014 0503   K 5.7 (H) 03/30/2024 1242   K 4.7 07/11/2014 0958   CL 104 03/09/2024 0651   CL 98 07/09/2014 0503   CO2 24 03/07/2024 1111   CO2 30 07/09/2014 0503   GLUCOSE 103 (H) 03/09/2024 0651   GLUCOSE 107 (H) 07/09/2014 0503   BUN 67 (H) 03/09/2024 0651   BUN 39 (H) 07/09/2014 0503   CREATININE 6.60 (H) 03/09/2024 0651   CREATININE 5.53 (H) 07/09/2014 0503   CALCIUM  8.7 (L) 03/07/2024 1111   CALCIUM  8.3 (L) 07/09/2014 0503   PROT 7.7 05/17/2023 0747   PROT 8.4 (H) 07/07/2014 2207   ALBUMIN  3.6 05/19/2023 0940   ALBUMIN   3.1 (L) 07/07/2014 2207   AST 24 05/17/2023 0747   AST 76 (H) 07/07/2014 2207   ALT 16 05/17/2023 0747   ALT 41 07/07/2014 2207   ALKPHOS 105 05/17/2023 0747   ALKPHOS 107 07/07/2014 2207   BILITOT 0.8 05/17/2023 0747   BILITOT 0.7 07/07/2014 2207   GFRNONAA 6 (L) 03/07/2024 1111   GFRNONAA 9 (L) 07/09/2014 0503   GFRNONAA 9 (L) 02/11/2014 0300     No results found.     Assessment & Plan:   1. ESRD (end stage renal disease) (HCC) (Primary) The patient continues to have extensive pain in her right upper extremity.  While this could be reperfusion  from her recent revascularization, there is still numbness and decreased motor function in the right hand.  Because of this I think it would be most prudent to ligate her fistula and wanted to ensure that we do not have further decrease in function in her right upper extremity.  At this time the patient will also have a PermCath placed.  I have discussed the risk benefits and alternatives to the patient she is agreeable to proceed at this time.  2. Hyperlipidemia, unspecified hyperlipidemia type Continue statin as ordered and reviewed, no changes at this time  3. Primary hypertension Continue antihypertensive medications as already ordered, these medications have been reviewed and there are no changes at this time.   Current Outpatient Medications on File Prior to Visit  Medication Sig Dispense Refill   albuterol  (VENTOLIN  HFA) 108 (90 Base) MCG/ACT inhaler Inhale 1-2 puffs into the lungs every 6 (six) hours as needed for wheezing or shortness of breath. 20.1 g 3   apixaban  (ELIQUIS ) 5 MG TABS tablet Take 5 mg by mouth 2 (two) times daily.     hydrOXYzine  (ATARAX /VISTARIL ) 50 MG tablet Take 50 mg by mouth at bedtime.     levothyroxine  (SYNTHROID ) 75 MCG tablet Take 75 mcg by mouth daily before breakfast.      lidocaine -prilocaine  (EMLA ) cream Apply 1 Application topically once.     metoprolol  succinate (TOPROL -XL) 25 MG 24 hr tablet Take 1 tablet by mouth daily.     midodrine  (PROAMATINE ) 10 MG tablet Take 1 tablet (10 mg total) by mouth 3 (three) times daily. 90 tablet 0   mupirocin  ointment (BACTROBAN ) 2 % Apply small amount to the inside of both nostrils TWICE a day for the next 5 days. 15 g 0   pantoprazole  (PROTONIX ) 40 MG tablet Take 40 mg by mouth daily.     ranolazine  (RANEXA ) 1000 MG SR tablet Take 1,000 mg by mouth 2 (two) times daily.     rosuvastatin  (CRESTOR ) 20 MG tablet Take 20 mg by mouth at bedtime.     sevelamer  carbonate (RENVELA ) 800 MG tablet Take 3,200 mg by mouth 3  (three) times daily with meals.     triamcinolone  cream (KENALOG ) 0.1 % Apply 1 Application topically 2 (two) times daily. 453.6 g 2   Current Facility-Administered Medications on File Prior to Visit  Medication Dose Route Frequency Provider Last Rate Last Admin   heparin  injection    PRN Dew, Jason S, MD   10,000 Units at 04/08/23 1412    There are no Patient Instructions on file for this visit. No follow-ups on file.   Avraham Benish E Rebeca Valdivia, NP

## 2024-04-09 NOTE — H&P (View-Only) (Signed)
 Subjective:    Patient ID: [name], female    DOB: October 06, 1960, 63 y.o.   MRN: [id number] Chief Complaint  Patient presents with   Follow-up    HDA R arm/continuous pain     The patient returns today following right upper extremity angiogram on 03/30/2024 which included:  PROCEDURE PERFORMED: 1. Ultrasound guidance vascular access to right femoral artery. 2. Catheter placement to right radial artery and right ulnar arteries     from right femoral approach. 3. Thoracic aortogram and selective right upper extremity angiogram     including selective images of the radial and ulnar arteries. 4.  Percutaneous transluminal angioplasty of the right radial artery with 2.5 mm diameter by 30 cm angioplasty balloon and 3 cm x 15 cm length angioplasty balloon 5.  Percutaneous transluminal angioplasty of the right ulnar artery (analogous to a typical brachial artery location at the antecubital fossa) with 4 mm diameter and 5 mm diameter by 10 cm length Lutonix drug-coated angioplasty balloons 6. Celt closure device right femoral artery.   She notes that she continues to have significant pain in her right upper extremity.  She continues to have numbness while on dialysis.  She does note that her hand is warm compared to previous assessment but she continues to have significant pain and it tends to be worse at night.  She denies any swelling of the upper extremity.  She also endorses having difficulty with use of her right hand.  While she can move it she cannot grip anything with her right hand.    Review of Systems  Neurological:  Positive for numbness.  All other systems reviewed and are negative.      Objective:   Physical Exam Vitals reviewed.  HENT:     Head: Normocephalic.  Cardiovascular:     Rate and Rhythm: Normal rate.     Pulses:          Radial pulses are 1+ on the right side.  Pulmonary:     Effort: Pulmonary effort is normal.  Neurological:     Mental Status: She  is alert and oriented to person, place, and time.     Motor: Weakness present.  Psychiatric:        Mood and Affect: Mood normal.        Behavior: Behavior normal.        Thought Content: Thought content normal.        Judgment: Judgment normal.     BP (!) 138/100 (BP Location: Right Leg, Patient Position: Sitting, Cuff Size: Small) Comment: patient in pain and was irritated on phone in waiting room  Pulse (!) 112   Resp 18   Ht 5' 6 (1.676 m)   Wt 161 lb 3.2 oz (73.1 kg)   BMI 26.02 kg/m   Past Medical History:  Diagnosis Date   Acute lacunar infarction (HCC) 03/03/2014   a.) MRI brain 03/03/2014: two acute punctate lacunar infarcts anteriorly in the BILATERAL centrum semiovale   Adult behavior problems    Anemia of chronic renal failure    Aortic atherosclerosis    Arthritis    Atrial fibrillation and flutter (HCC)    a.) CHA2DS2VASc = 6 (sex, HFrEF, HTN, CVA x 2, prior MI/vascular disease) as of 02/04/2024; b.) rate/rhythm maintained on oral metoprolol  succinate; chronically anticoagulated with apixaban    Bipolar disorder (HCC)    Cardiomegaly    Cerebral microvascular disease    CHF (congestive heart failure) (HCC)  Chicken pox    Cholelithiasis    Chronic respiratory failure with hypoxia (HCC) 12/19/2021   COPD (chronic obstructive pulmonary disease) (HCC)    Coronary artery disease    DDD (degenerative disc disease), cervical    DDD (degenerative disc disease), thoracolumbar    Diverticulosis    Dyspnea    ESRD (end stage renal disease) on dialysis (M,W,F)    GERD (gastroesophageal reflux disease)    HCV (hepatitis C virus)    Headache    HFrEF (heart failure with reduced ejection fraction) (HCC)    History of delirium 04/12/2014   History of substance abuse (HCC)    a.) tobacco + cocaine   Hyperkalemia 11/13/2014   Hyperlipidemia    Hypertension    Hypotension 02/24/2014   Hypothyroidism    Ischemic cardiomyopathy    Nose colonized with MRSA 02/01/2024    a.) presurgical PCR (+) on 02/01/2024 prior to A/V FISTULAGRAM; b.) presurgical PCR (+) on 03/07/2024 prior to AV FISTULA CREATION   NSTEMI (non-ST elevated myocardial infarction) (HCC) 10/08/2013   NSTEMI (non-ST elevated myocardial infarction) (HCC) 01/23/2002   a.) LHC 01/24/2002 --> multi-vessel CAD with IRA being 90% OM2 --> delayed PCI until 01/25/2002 --> 3.0 x 23 mm BX Velocity stent   Obesity    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    On apixaban  therapy    Peripheral vascular disease    Pneumonia    Postoperative anemia due to acute blood loss 02/27/2014   Postoperative cerebrovascular infarction following cardiac surgery 02/21/2014   a.) developed RIGHT upper/lower extremity weakness following cardiac revascularization --> neuro consult and head CT --> Hypodensity noted in the area of the LEFT motor cortex consistent with a component of acute ischemia   Pulmonary hypertension (HCC)    RBBB (right bundle branch block)    Renal osteodystrophy    S/P CABG x 3 02/20/2014   a.) LIMA-LAD, SVG-OM1, SVG-PDA   Stroke (HCC) 05/23/2010   a.) brain MRI 05/23/2010: BILATERAL acute infarcts and old prior infarcts; RIGHT frontal lobe extending into the white matter. Small focus of restricted diffusion in the cortex of the medial LEFT frontoparietal lobe. Periventricular T2 and multiple foci of T2 hyperintensity in the subcortical white matter and increased FLAIR signal in the cortex of the LEFT frontal lobe.   Syncope and collapse 07/25/2020    Social History   Socioeconomic History   Marital status: Single    Spouse name: Not on file   Number of children: Not on file   Years of education: Not on file   Highest education level: Not on file  Occupational History   Not on file  Tobacco Use   Smoking status: Every Day    Current packs/day: 0.25    Average packs/day: 0.3 packs/day for 20.0 years (5.0 ttl pk-yrs)    Types: Cigarettes   Smokeless tobacco: Never   Tobacco  comments:    smokes 1 - 1.5 cigarettes a day  Vaping Use   Vaping status: Never Used  Substance and Sexual Activity   Alcohol use: No    Comment: social   Drug use: No   Sexual activity: Not Currently    Birth control/protection: Post-menopausal  Other Topics Concern   Not on file  Social History Narrative   Lives alone   Social Drivers of Health   Financial Resource Strain: Not on file  Food Insecurity: No Food Insecurity (05/18/2023)   Hunger Vital Sign    Worried About Running Out  of Food in the Last Year: Never true    Ran Out of Food in the Last Year: Never true  Transportation Needs: No Transportation Needs (05/18/2023)   PRAPARE - Administrator, Civil Service (Medical): No    Lack of Transportation (Non-Medical): No  Physical Activity: Not on file  Stress: Not on file  Social Connections: Not on file  Intimate Partner Violence: Not At Risk (05/18/2023)   Humiliation, Afraid, Rape, and Kick questionnaire    Fear of Current or Ex-Partner: No    Emotionally Abused: No    Physically Abused: No    Sexually Abused: No    Past Surgical History:  Procedure Laterality Date   A/V FISTULAGRAM Left 03/30/2019   Procedure: A/V FISTULAGRAM;  Surgeon: Marea Selinda RAMAN, MD;  Location: [facility];  Service: Cardiovascular;  Laterality: Left;   A/V FISTULAGRAM Left 10/06/2019   Procedure: A/V FISTULAGRAM;  Surgeon: Marea Selinda RAMAN, MD;  Location: [facility];  Service: Cardiovascular;  Laterality: Left;   A/V FISTULAGRAM Left 05/19/2021   Procedure: A/V FISTULAGRAM;  Surgeon: Marea Selinda RAMAN, MD;  Location: [facility];  Service: Cardiovascular;  Laterality: Left;   A/V FISTULAGRAM Left 01/29/2022   Procedure: A/V Fistulagram;  Surgeon: Marea Selinda RAMAN, MD;  Location: [facility];  Service: Cardiovascular;  Laterality: Left;   A/V FISTULAGRAM Left 04/30/2022   Procedure: A/V Fistulagram;  Surgeon: Marea Selinda RAMAN, MD;  Location: [facility];   Service: Cardiovascular;  Laterality: Left;   A/V FISTULAGRAM Left 08/20/2022   Procedure: A/V Fistulagram;  Surgeon: Marea Selinda RAMAN, MD;  Location: [facility];  Service: Cardiovascular;  Laterality: Left;   A/V FISTULAGRAM N/A 12/03/2022   Procedure: A/V Fistulagram;  Surgeon: Marea Selinda RAMAN, MD;  Location: [facility];  Service: Cardiovascular;  Laterality: N/A;   A/V FISTULAGRAM Left 02/11/2023   Procedure: A/V Fistulagram;  Surgeon: Marea Selinda RAMAN, MD;  Location: [facility];  Service: Cardiovascular;  Laterality: Left;   A/V FISTULAGRAM Left 02/07/2024   Procedure: A/V Fistulagram;  Surgeon: Marea Selinda RAMAN, MD;  Location: [facility];  Service: Cardiovascular;  Laterality: Left;   COLONOSCOPY     COLONOSCOPY WITH PROPOFOL  N/A 04/22/2021   Procedure: COLONOSCOPY WITH PROPOFOL ;  Surgeon: Maryruth Ole DASEN, MD;  Location: [facility];  Service: Endoscopy;  Laterality: N/A;   CORONARY ANGIOPLASTY WITH STENT PLACEMENT Left 01/25/2002   Procedure: CORONARY ANGIOPLASTY WITH STENT PLACEMENT; Location: [facility]; Surgeon: Margie Lovelace, MD   CORONARY ARTERY BYPASS GRAFT N/A 02/20/2014   Procedure: CORONARY ARTERY BYPASS GRAFT; Location: [facility]; Surgeon: Reyes Fruits, MD   DIALYSIS FISTULA CREATION     DIALYSIS/PERMA CATHETER INSERTION N/A 04/08/2023   Procedure: DIALYSIS/PERMA CATHETER INSERTION;  Surgeon: Marea Selinda RAMAN, MD;  Location: [facility];  Service: Cardiovascular;  Laterality: N/A;   DIALYSIS/PERMA CATHETER REMOVAL N/A 07/06/2023   Procedure: DIALYSIS/PERMA CATHETER REMOVAL;  Surgeon: [name] Cordella MATSU, MD;  Location: [facility];  Service: Cardiovascular;  Laterality: N/A;   ESOPHAGOGASTRODUODENOSCOPY     ESOPHAGOGASTRODUODENOSCOPY N/A 04/22/2021   Procedure: ESOPHAGOGASTRODUODENOSCOPY (EGD);  Surgeon: Maryruth Ole DASEN, MD;  Location: [facility];  Service: Endoscopy;  Laterality: N/A;   FLEXIBLE SIGMOIDOSCOPY N/A 02/23/2019    Procedure: FLEXIBLE SIGMOIDOSCOPY;  Surgeon: Gaylyn Gladis PENNER, MD;  Location: [facility];  Service: Endoscopy;  Laterality: N/A;   IABP INSERTION  02/20/2014   Procedure: INTRA-AORTIC BALLOON PUMP INSERTION;  Location: [facility]; Surgeon: Reyes Fruits, MD   INSERTION OF ARTERIOVENOUS (AV) ARTEGRAFT ARM Right 03/09/2024   Procedure: INSERTION, GRAFT, ARTERIOVENOUS, UPPER EXTREMITY;  Surgeon: Marea Selinda RAMAN, MD;  Location: [facility];  Service: Vascular;  Laterality: Right;  BRACHIAL AXILLARY   LEFT HEART CATH AND CORONARY ANGIOGRAPHY Left 01/24/2002   Procedure: LEFT HEART CATH AND CORONARY ANGIOGRAPHY; Location: [facility]; Surgeon: Margie Lovelace, MD   LEFT HEART CATH AND CORONARY ANGIOGRAPHY Left 05/25/2012   Procedure: LEFT HEART CATH AND CORONARY ANGIOGRAPHY; Location: [facility]; Surgeon: Margie Lovelace, MD   LEFT HEART CATH AND CORONARY ANGIOGRAPHY Left 10/09/2013   Procedure: LEFT HEART CATH AND CORONARY ANGIOGRAPHY; Location: [facility]; Surgeon: Vinie Jude, MD   LEFT HEART CATH AND CORS/GRAFTS ANGIOGRAPHY N/A 08/08/2018   Procedure: LEFT HEART CATH AND CORS/GRAFTS ANGIOGRAPHY;  Surgeon: Fernand Denyse LABOR, MD;  Location: [facility];  Service: Cardiovascular;  Laterality: N/A;   LEFT HEART CATH AND CORS/GRAFTS ANGIOGRAPHY N/A 01/30/2019   Procedure: LEFT HEART CATH AND CORS/GRAFTS ANGIOGRAPHY;  Surgeon: Fernand Denyse LABOR, MD;  Location: [facility];  Service: Cardiovascular;  Laterality: N/A;   ultrasound guided pericardiocentesis     UPPER EXTREMITY ANGIOGRAPHY Right 03/30/2024   Procedure: Upper Extremity Angiography;  Surgeon: Marea Selinda RAMAN, MD;  Location: [facility];  Service: Cardiovascular;  Laterality: Right;    Family History  Problem Relation Age of Onset   Hypertension Mother    Ovarian cancer Mother    Diabetes type II Mother    Hypertension Father    Kidney disease Father    Hypertension Sister    Diabetes type II Maternal Grandmother    Breast cancer Maternal  Grandmother    Breast cancer Maternal Aunt    Hypertension Other    Cancer Other    Renal Disease Other     Allergies  Allergen Reactions   Morphine  And Codeine Hives   Levaquin [Levofloxacin] Itching and Other (See Comments)    Severe itching; prickly sensation; severe left leg pain; immobility.   Enalapril  Other (See Comments)    hallucinations   Levothyroxine      Immense pain in left hip area    Latex Rash   Tape Rash and Other (See Comments)       Latest Ref Rng & Units 03/09/2024    6:51 AM 03/07/2024   11:11 AM 02/01/2024   10:26 AM  CBC  WBC 4.0 - 10.5 K/uL  6.4  4.9   Hemoglobin 12.0 - 15.0 g/dL 84.9  85.6  86.4   Hematocrit 36.0 - 46.0 % 44.0  44.9  41.2   Platelets 150 - 400 K/uL  146  116       CMP     Component Value Date/Time   NA 132 (L) 03/09/2024 0651   NA 139 07/09/2014 0503   K 5.7 (H) 03/30/2024 1242   K 4.7 07/11/2014 0958   CL 104 03/09/2024 0651   CL 98 07/09/2014 0503   CO2 24 03/07/2024 1111   CO2 30 07/09/2014 0503   GLUCOSE 103 (H) 03/09/2024 0651   GLUCOSE 107 (H) 07/09/2014 0503   BUN 67 (H) 03/09/2024 0651   BUN 39 (H) 07/09/2014 0503   CREATININE 6.60 (H) 03/09/2024 0651   CREATININE 5.53 (H) 07/09/2014 0503   CALCIUM  8.7 (L) 03/07/2024 1111   CALCIUM  8.3 (L) 07/09/2014 0503   PROT 7.7 05/17/2023 0747   PROT 8.4 (H) 07/07/2014 2207   ALBUMIN  3.6 05/19/2023 0940   ALBUMIN   3.1 (L) 07/07/2014 2207   AST 24 05/17/2023 0747   AST 76 (H) 07/07/2014 2207   ALT 16 05/17/2023 0747   ALT 41 07/07/2014 2207   ALKPHOS 105 05/17/2023 0747   ALKPHOS 107 07/07/2014 2207   BILITOT 0.8 05/17/2023 0747   BILITOT 0.7 07/07/2014 2207   GFRNONAA 6 (L) 03/07/2024 1111   GFRNONAA 9 (L) 07/09/2014 0503   GFRNONAA 9 (L) 02/11/2014 0300     No results found.     Assessment & Plan:   1. ESRD (end stage renal disease) (HCC) (Primary) The patient continues to have extensive pain in her right upper extremity.  While this could be reperfusion  from her recent revascularization, there is still numbness and decreased motor function in the right hand.  Because of this I think it would be most prudent to ligate her fistula and wanted to ensure that we do not have further decrease in function in her right upper extremity.  At this time the patient will also have a PermCath placed.  I have discussed the risk benefits and alternatives to the patient she is agreeable to proceed at this time.  2. Hyperlipidemia, unspecified hyperlipidemia type Continue statin as ordered and reviewed, no changes at this time  3. Primary hypertension Continue antihypertensive medications as already ordered, these medications have been reviewed and there are no changes at this time.   Current Outpatient Medications on File Prior to Visit  Medication Sig Dispense Refill   albuterol  (VENTOLIN  HFA) 108 (90 Base) MCG/ACT inhaler Inhale 1-2 puffs into the lungs every 6 (six) hours as needed for wheezing or shortness of breath. 20.1 g 3   apixaban  (ELIQUIS ) 5 MG TABS tablet Take 5 mg by mouth 2 (two) times daily.     hydrOXYzine  (ATARAX /VISTARIL ) 50 MG tablet Take 50 mg by mouth at bedtime.     levothyroxine  (SYNTHROID ) 75 MCG tablet Take 75 mcg by mouth daily before breakfast.      lidocaine -prilocaine  (EMLA ) cream Apply 1 Application topically once.     metoprolol  succinate (TOPROL -XL) 25 MG 24 hr tablet Take 1 tablet by mouth daily.     midodrine  (PROAMATINE ) 10 MG tablet Take 1 tablet (10 mg total) by mouth 3 (three) times daily. 90 tablet 0   mupirocin  ointment (BACTROBAN ) 2 % Apply small amount to the inside of both nostrils TWICE a day for the next 5 days. 15 g 0   pantoprazole  (PROTONIX ) 40 MG tablet Take 40 mg by mouth daily.     ranolazine  (RANEXA ) 1000 MG SR tablet Take 1,000 mg by mouth 2 (two) times daily.     rosuvastatin  (CRESTOR ) 20 MG tablet Take 20 mg by mouth at bedtime.     sevelamer  carbonate (RENVELA ) 800 MG tablet Take 3,200 mg by mouth 3  (three) times daily with meals.     triamcinolone  cream (KENALOG ) 0.1 % Apply 1 Application topically 2 (two) times daily. 453.6 g 2   Current Facility-Administered Medications on File Prior to Visit  Medication Dose Route Frequency Provider Last Rate Last Admin   heparin  injection    PRN Dew, Jason S, MD   10,000 Units at 04/08/23 1412    There are no Patient Instructions on file for this visit. No follow-ups on file.   Avraham Benish E Rebeca Valdivia, NP

## 2024-04-10 ENCOUNTER — Telehealth (INDEPENDENT_AMBULATORY_CARE_PROVIDER_SITE_OTHER): Payer: Self-pay

## 2024-04-10 NOTE — Telephone Encounter (Signed)
 Spoke with the patient and she is scheduled with Dr. Marea for a ligation on right arm graft and permcath insertion on 04/20/24 at [facility]. Pre-admit will call to schedule a pre-op  at the [facility]. Pre-surgical instructions were discussed and will be mailed.

## 2024-04-12 ENCOUNTER — Other Ambulatory Visit (INDEPENDENT_AMBULATORY_CARE_PROVIDER_SITE_OTHER): Payer: Self-pay | Admitting: Nurse Practitioner

## 2024-04-12 DIAGNOSIS — N186 End stage renal disease: Secondary | ICD-10-CM

## 2024-04-12 NOTE — Patient Instructions (Addendum)
 Your procedure is scheduled on:04-20-24 Thursday Report to the Registration Desk on the 1st floor of the [facility]. Then proceed to the 2nd floor Surgery Desk To find out your arrival time, please call [phone] between 1PM - 3PM on:04-19-24 Wednesday If your arrival time is 6:00 am, do not arrive before that time as the [facility] entrance doors do not open until 6:00 am.  REMEMBER: Instructions that are not followed completely may result in serious medical risk, up to and including death; or upon the discretion of your surgeon and anesthesiologist your surgery may need to be rescheduled.  Do not eat food OR drink liquids after midnight the night before surgery.  No gum chewing or hard candies.  One week prior to surgery:Stop NOW (04-12-24) Stop Anti-inflammatories (NSAIDS) such as Advil , Aleve, Ibuprofen , Motrin , Naproxen, Naprosyn and Aspirin  based products such as Excedrin, Goody's Powder, BC Powder. Stop ANY OVER THE COUNTER supplements until after surgery.  You may however, continue to take Tylenol  if needed for pain up until the day of surgery.  Stop apixaban  (ELIQUIS ) 1 day prior to surgery-Last dose will be on 04-18-24 Tuesday  Continue taking all of your other prescription medications up until the day of surgery.  ON THE DAY OF SURGERY ONLY TAKE THESE MEDICATIONS WITH SIPS OF WATER: -levothyroxine  (SYNTHROID )  -metoprolol  succinate (TOPROL -XL)  -midodrine  (PROAMATINE )  -pantoprazole  (PROTONIX )  -ranolazine  (RANEXA )   Bring your Albuterol  Inhaler to the hospital  No Alcohol for 24 hours before or after surgery.  No Smoking including e-cigarettes for 24 hours before surgery.  No chewable tobacco products for at least 6 hours before surgery.  No nicotine patches on the day of surgery.  Do not use any recreational drugs for at least a week (preferably 2 weeks) before your surgery.  Please be advised that the combination of cocaine and anesthesia may have negative  outcomes, up to and including death. If you test positive for cocaine, your surgery will be cancelled.  On the morning of surgery brush your teeth with toothpaste and water, you may rinse your mouth with mouthwash if you wish. Do not swallow any toothpaste or mouthwash.  Use CHG Soap as directed on instruction sheet.  Do not wear jewelry, make-up, hairpins, clips or nail polish.  For welded (permanent) jewelry: bracelets, anklets, waist bands, etc.  Please have this removed prior to surgery.  If it is not removed, there is a chance that hospital personnel will need to cut it off on the day of surgery.  Do not wear lotions, powders, or perfumes.   Do not shave body hair from the neck down 48 hours before surgery.  Contact lenses, hearing aids and dentures may not be worn into surgery.  Do not bring valuables to the hospital. [facility] is not responsible for any missing/lost belongings or valuables.   Notify your doctor if there is any change in your medical condition (cold, fever, infection).  Wear comfortable clothing (specific to your surgery type) to the hospital.  After surgery, you can help prevent lung complications by doing breathing exercises.  Take deep breaths and cough every 1-2 hours. Your doctor may order a device called an Incentive Spirometer to help you take deep breaths. When coughing or sneezing, hold a pillow firmly against your incision with both hands. This is called "splinting." Doing this helps protect your incision. It also decreases belly discomfort.  If you are being admitted to the hospital overnight, leave your suitcase in the car. After  surgery it may be brought to your room.  In case of increased patient census, it may be necessary for you, the patient, to continue your postoperative care in the Same Day Surgery department.  If you are being discharged the day of surgery, you will not be allowed to drive home. You will need a responsible individual to  drive you home and stay with you for 24 hours after surgery.   If you are taking public transportation, you will need to have a responsible individual with you.  Please call the Pre-admissions Testing Dept. at [phone] if you have any questions about these instructions.  Surgery Visitation Policy:  Patients having surgery or a procedure may have two visitors.  Children under the age of 1 must have an adult with them who is not the patient.                                                                                                             Preparing for Surgery with CHLORHEXIDINE  GLUCONATE (CHG) Soap  Chlorhexidine  Gluconate (CHG) Soap  o An antiseptic cleaner that kills germs and bonds with the skin to continue killing germs even after washing  o Used for showering the night before surgery and morning of surgery  Before surgery, you can play an important role by reducing the number of germs on your skin.  CHG (Chlorhexidine  gluconate) soap is an antiseptic cleanser which kills germs and bonds with the skin to continue killing germs even after washing.  Please do not use if you have an allergy to CHG or antibacterial soaps. If your skin becomes reddened/irritated stop using the CHG.  1. Shower the NIGHT BEFORE SURGERY with CHG soap.  2. If you choose to wash your hair, wash your hair first as usual with your normal shampoo.  3. After shampooing, rinse your hair and body thoroughly to remove the shampoo.  4. Use CHG as you would any other liquid soap. You can apply CHG directly to the skin and wash gently with a clean washcloth.  5. Apply the CHG soap to your body only from the neck down. Do not use on open wounds or open sores. Avoid contact with your eyes, ears, mouth, and genitals (private parts). Wash face and genitals (private parts) with your normal soap.  6. Wash thoroughly, paying special attention to the area where your surgery will be performed.  7.  Thoroughly rinse your body with warm water.  8. Do not shower/wash with your normal soap after using and rinsing off the CHG soap.  9. Do not use lotions, oils, etc., after showering with CHG.  10. Pat yourself dry with a clean towel.  11. Wear clean pajamas to bed the night before surgery.  12. Place clean sheets on your bed the night of your shower and do not sleep with pets.  13. Do not apply any deodorants/lotions/powders.  14. Please wear clean clothes to the hospital.  15. Remember to brush your teeth with your regular toothpaste.   Merch[employer], Retail  to address health-related social needs:  [email or web address]

## 2024-04-13 ENCOUNTER — Encounter
Admission: RE | Admit: 2024-04-13 | Discharge: 2024-04-13 | Disposition: A | Discharge status: Home / Self Care | Source: Ambulatory Visit | Attending: Vascular Surgery | Admitting: Vascular Surgery

## 2024-04-13 ENCOUNTER — Ambulatory Visit: Admitting: Cardiology

## 2024-04-13 ENCOUNTER — Encounter: Payer: Self-pay | Admitting: Cardiology

## 2024-04-13 ENCOUNTER — Other Ambulatory Visit (INDEPENDENT_AMBULATORY_CARE_PROVIDER_SITE_OTHER): Payer: Self-pay | Admitting: Nurse Practitioner

## 2024-04-13 VITALS — BP 132/95 | HR 69 | Resp 16 | Ht 66.0 in | Wt 161.0 lb

## 2024-04-13 VITALS — BP 132/95 | HR 69 | Ht 66.0 in | Wt 161.0 lb

## 2024-04-13 DIAGNOSIS — N186 End stage renal disease: Secondary | ICD-10-CM | POA: Insufficient documentation

## 2024-04-13 DIAGNOSIS — M21619 Bunion of unspecified foot: Secondary | ICD-10-CM | POA: Insufficient documentation

## 2024-04-13 DIAGNOSIS — I10 Essential (primary) hypertension: Secondary | ICD-10-CM | POA: Diagnosis not present

## 2024-04-13 DIAGNOSIS — Z992 Dependence on renal dialysis: Secondary | ICD-10-CM | POA: Diagnosis not present

## 2024-04-13 DIAGNOSIS — M10072 Idiopathic gout, left ankle and foot: Secondary | ICD-10-CM | POA: Diagnosis not present

## 2024-04-13 DIAGNOSIS — Z01818 Encounter for other preprocedural examination: Secondary | ICD-10-CM

## 2024-04-13 DIAGNOSIS — Z01812 Encounter for preprocedural laboratory examination: Secondary | ICD-10-CM | POA: Insufficient documentation

## 2024-04-13 LAB — BASIC METABOLIC PANEL WITH GFR
Anion gap: 18 — ABNORMAL HIGH (ref 5–15)
BUN: 20 mg/dL (ref 8–23)
CO2: 23 mmol/L (ref 22–32)
Calcium: 8.4 mg/dL — ABNORMAL LOW (ref 8.9–10.3)
Chloride: 99 mmol/L (ref 98–111)
Creatinine, Ser: 5.48 mg/dL — ABNORMAL HIGH (ref 0.44–1.00)
GFR, Estimated: 8 mL/min — ABNORMAL LOW (ref >60)
Glucose, Bld: 81 mg/dL (ref 70–99)
Potassium: 4.2 mmol/L (ref 3.5–5.1)
Sodium: 140 mmol/L (ref 135–145)

## 2024-04-13 MED ORDER — COLCHICINE 0.6 MG PO TABS: 0.6000 mg | ORAL_TABLET | Freq: Once | ORAL | 0 refills | Status: DC

## 2024-04-13 NOTE — Progress Notes (Signed)
 Established Patient Office Visit  Subjective:  Patient ID: [name], female    DOB: 12-Apr-1961  Age: 63 y.o. MRN: [id number]  Chief Complaint  Patient presents with   Acute Visit    Left foot pain. Stabbing pain since last night.    Patient in office for an acute visit. Patient complaining of left foot pain. Pain is on the lateral side of the left foot, start with the small toe down the lateral side. Foot is swollen. No pain on top or medial side of foot. Patient denies injury, states she noticed the pain when she woke up this morning. Will get a uric acid today. Will send in colchicine. Due to ESRD, will send in one single dose.  Patient has bunions on both feet, will send referral to podiatry.   Blood pressure elevated today. Patient scheduled for fistula revision next week.   Foot Injury  The incident occurred 6 to 12 hours ago. The incident occurred at home. There was no injury mechanism. The pain is present in the left foot. The quality of the pain is described as stabbing and shooting. The pain is at a severity of 10/10. The pain is severe. The pain has been Constant since onset. Associated symptoms include an inability to bear weight and numbness. Pertinent negatives include no tingling. She reports no foreign bodies present. The symptoms are aggravated by movement, weight bearing and palpation. Treatments tried: Oxycodone . The treatment provided no relief.    No other concerns at this time.   Past Medical History:  Diagnosis Date   Acute lacunar infarction (HCC) 03/03/2014   a.) MRI brain 03/03/2014: two acute punctate lacunar infarcts anteriorly in the BILATERAL centrum semiovale   Adult behavior problems    Anemia of chronic renal failure    Aortic atherosclerosis    Arthritis    Atrial fibrillation and flutter (HCC)    a.) CHA2DS2VASc = 6 (sex, HFrEF, HTN, CVA x 2, prior MI/vascular disease) as of 02/04/2024; b.) rate/rhythm maintained on oral metoprolol  succinate;  chronically anticoagulated with apixaban    Bipolar disorder (HCC)    Cardiomegaly    Cerebral microvascular disease    CHF (congestive heart failure) (HCC)    Chicken pox    Cholelithiasis    Chronic respiratory failure with hypoxia (HCC) 12/19/2021   COPD (chronic obstructive pulmonary disease) (HCC)    Coronary artery disease    DDD (degenerative disc disease), cervical    DDD (degenerative disc disease), thoracolumbar    Diverticulosis    Dyspnea    ESRD (end stage renal disease) on dialysis (M,W,F)    GERD (gastroesophageal reflux disease)    HCV (hepatitis C virus)    Headache    HFrEF (heart failure with reduced ejection fraction) (HCC)    History of delirium 04/12/2014   History of substance abuse (HCC)    a.) tobacco + cocaine   Hyperkalemia 11/13/2014   Hyperlipidemia    Hypertension    Hypotension 02/24/2014   Hypothyroidism    Ischemic cardiomyopathy    Nose colonized with MRSA 02/01/2024   a.) presurgical PCR (+) on 02/01/2024 prior to A/V FISTULAGRAM; b.) presurgical PCR (+) on 03/07/2024 prior to AV FISTULA CREATION   NSTEMI (non-ST elevated myocardial infarction) (HCC) 10/08/2013   NSTEMI (non-ST elevated myocardial infarction) (HCC) 01/23/2002   a.) LHC 01/24/2002 --> multi-vessel CAD with IRA being 90% OM2 --> delayed PCI until 01/25/2002 --> 3.0 x 23 mm BX Velocity stent   Obesity  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    On apixaban  therapy    Peripheral vascular disease    Pneumonia    Postoperative anemia due to acute blood loss 02/27/2014   Postoperative cerebrovascular infarction following cardiac surgery 02/21/2014   a.) developed RIGHT upper/lower extremity weakness following cardiac revascularization --> neuro consult and head CT --> Hypodensity noted in the area of the LEFT motor cortex consistent with a component of acute ischemia   Pulmonary hypertension (HCC)    RBBB (right bundle branch block)    Renal osteodystrophy    S/P CABG x  3 02/20/2014   a.) LIMA-LAD, SVG-OM1, SVG-PDA   Stroke (HCC) 05/23/2010   a.) brain MRI 05/23/2010: BILATERAL acute infarcts and old prior infarcts; RIGHT frontal lobe extending into the white matter. Small focus of restricted diffusion in the cortex of the medial LEFT frontoparietal lobe. Periventricular T2 and multiple foci of T2 hyperintensity in the subcortical white matter and increased FLAIR signal in the cortex of the LEFT frontal lobe.   Syncope and collapse 07/25/2020    Past Surgical History:  Procedure Laterality Date   A/V FISTULAGRAM Left 03/30/2019   Procedure: A/V FISTULAGRAM;  Surgeon: Marea Selinda RAMAN, MD;  Location: [facility];  Service: Cardiovascular;  Laterality: Left;   A/V FISTULAGRAM Left 10/06/2019   Procedure: A/V FISTULAGRAM;  Surgeon: Marea Selinda RAMAN, MD;  Location: [facility];  Service: Cardiovascular;  Laterality: Left;   A/V FISTULAGRAM Left 05/19/2021   Procedure: A/V FISTULAGRAM;  Surgeon: Marea Selinda RAMAN, MD;  Location: [facility];  Service: Cardiovascular;  Laterality: Left;   A/V FISTULAGRAM Left 01/29/2022   Procedure: A/V Fistulagram;  Surgeon: Marea Selinda RAMAN, MD;  Location: [facility];  Service: Cardiovascular;  Laterality: Left;   A/V FISTULAGRAM Left 04/30/2022   Procedure: A/V Fistulagram;  Surgeon: Marea Selinda RAMAN, MD;  Location: [facility];  Service: Cardiovascular;  Laterality: Left;   A/V FISTULAGRAM Left 08/20/2022   Procedure: A/V Fistulagram;  Surgeon: Marea Selinda RAMAN, MD;  Location: [facility];  Service: Cardiovascular;  Laterality: Left;   A/V FISTULAGRAM N/A 12/03/2022   Procedure: A/V Fistulagram;  Surgeon: Marea Selinda RAMAN, MD;  Location: [facility];  Service: Cardiovascular;  Laterality: N/A;   A/V FISTULAGRAM Left 02/11/2023   Procedure: A/V Fistulagram;  Surgeon: Marea Selinda RAMAN, MD;  Location: [facility];  Service: Cardiovascular;  Laterality: Left;   A/V FISTULAGRAM Left  02/07/2024   Procedure: A/V Fistulagram;  Surgeon: Marea Selinda RAMAN, MD;  Location: [facility];  Service: Cardiovascular;  Laterality: Left;   COLONOSCOPY     COLONOSCOPY WITH PROPOFOL  N/A 04/22/2021   Procedure: COLONOSCOPY WITH PROPOFOL ;  Surgeon: Maryruth Ole DASEN, MD;  Location: [facility];  Service: Endoscopy;  Laterality: N/A;   CORONARY ANGIOPLASTY WITH STENT PLACEMENT Left 01/25/2002   Procedure: CORONARY ANGIOPLASTY WITH STENT PLACEMENT; Location: [facility]; Surgeon: Margie Lovelace, MD   CORONARY ARTERY BYPASS GRAFT N/A 02/20/2014   Procedure: CORONARY ARTERY BYPASS GRAFT; Location: [facility]; Surgeon: Reyes Fruits, MD   DIALYSIS FISTULA CREATION     DIALYSIS/PERMA CATHETER INSERTION N/A 04/08/2023   Procedure: DIALYSIS/PERMA CATHETER INSERTION;  Surgeon: Marea Selinda RAMAN, MD;  Location: [facility];  Service: Cardiovascular;  Laterality: N/A;   DIALYSIS/PERMA CATHETER REMOVAL N/A 07/06/2023   Procedure: DIALYSIS/PERMA CATHETER REMOVAL;  Surgeon: [name] Cordella MATSU, MD;  Location: [facility];  Service: Cardiovascular;  Laterality:  N/A;   ESOPHAGOGASTRODUODENOSCOPY     ESOPHAGOGASTRODUODENOSCOPY N/A 04/22/2021   Procedure: ESOPHAGOGASTRODUODENOSCOPY (EGD);  Surgeon: Maryruth Ole DASEN, MD;  Location: [facility];  Service: Endoscopy;  Laterality: N/A;   FLEXIBLE SIGMOIDOSCOPY N/A 02/23/2019   Procedure: FLEXIBLE SIGMOIDOSCOPY;  Surgeon: Gaylyn Gladis PENNER, MD;  Location: [facility] ENDOSCOPY;  Service: Endoscopy;  Laterality: N/A;   IABP INSERTION  02/20/2014   Procedure: INTRA-AORTIC BALLOON PUMP INSERTION; Location: [facility]; Surgeon: Reyes Fruits, MD   INSERTION OF ARTERIOVENOUS (AV) ARTEGRAFT ARM Right 03/09/2024   Procedure: INSERTION, GRAFT, ARTERIOVENOUS, UPPER EXTREMITY;  Surgeon: Marea Selinda RAMAN, MD;  Location: [facility];  Service: Vascular;  Laterality: Right;  BRACHIAL AXILLARY   LEFT HEART CATH AND CORONARY ANGIOGRAPHY Left 01/24/2002   Procedure: LEFT HEART CATH AND  CORONARY ANGIOGRAPHY; Location: [facility]; Surgeon: Margie Lovelace, MD   LEFT HEART CATH AND CORONARY ANGIOGRAPHY Left 05/25/2012   Procedure: LEFT HEART CATH AND CORONARY ANGIOGRAPHY; Location: [facility]; Surgeon: Margie Lovelace, MD   LEFT HEART CATH AND CORONARY ANGIOGRAPHY Left 10/09/2013   Procedure: LEFT HEART CATH AND CORONARY ANGIOGRAPHY; Location: [facility]; Surgeon: Vinie Jude, MD   LEFT HEART CATH AND CORS/GRAFTS ANGIOGRAPHY N/A 08/08/2018   Procedure: LEFT HEART CATH AND CORS/GRAFTS ANGIOGRAPHY;  Surgeon: Fernand Denyse LABOR, MD;  Location: [facility];  Service: Cardiovascular;  Laterality: N/A;   LEFT HEART CATH AND CORS/GRAFTS ANGIOGRAPHY N/A 01/30/2019   Procedure: LEFT HEART CATH AND CORS/GRAFTS ANGIOGRAPHY;  Surgeon: Fernand Denyse LABOR, MD;  Location: [facility];  Service: Cardiovascular;  Laterality: N/A;   ultrasound guided pericardiocentesis     UPPER EXTREMITY ANGIOGRAPHY Right 03/30/2024   Procedure: Upper Extremity Angiography;  Surgeon: Marea Selinda RAMAN, MD;  Location: [facility];  Service: Cardiovascular;  Laterality: Right;    Social History   Socioeconomic History   Marital status: Single    Spouse name: Not on file   Number of children: Not on file   Years of education: Not on file   Highest education level: Not on file  Occupational History   Not on file  Tobacco Use   Smoking status: Every Day    Current packs/day: 0.25    Average packs/day: 0.3 packs/day for 20.0 years (5.0 ttl pk-yrs)    Types: Cigarettes   Smokeless tobacco: Never   Tobacco comments:    smokes 1 - 1.5 cigarettes a day  Vaping Use   Vaping status: Never Used  Substance and Sexual Activity   Alcohol use: No    Comment: social   Drug use: No   Sexual activity: Not Currently    Birth control/protection: Post-menopausal  Other Topics Concern   Not on file  Social History Narrative   Lives alone   Social Drivers of Health   Financial Resource Strain: Not on file  Food  Insecurity: No Food Insecurity (05/18/2023)   Hunger Vital Sign    Worried About Running Out of Food in the Last Year: Never true    Ran Out of Food in the Last Year: Never true  Transportation Needs: No Transportation Needs (05/18/2023)   PRAPARE - Administrator, Civil Service (Medical): No    Lack of Transportation (Non-Medical): No  Physical Activity: Not on file  Stress: Not on file  Social Connections: Not on file  Intimate Partner Violence: Not At Risk (05/18/2023)   Humiliation, Afraid, Rape, and Kick questionnaire    Fear of Current or Ex-Partner: No    Emotionally Abused: No  Physically Abused: No    Sexually Abused: No    Family History  Problem Relation Age of Onset   Hypertension Mother    Ovarian cancer Mother    Diabetes type II Mother    Hypertension Father    Kidney disease Father    Hypertension Sister    Diabetes type II Maternal Grandmother    Breast cancer Maternal Grandmother    Breast cancer Maternal Aunt    Hypertension Other    Cancer Other    Renal Disease Other     Allergies  Allergen Reactions   Morphine  And Codeine Hives   Levaquin [Levofloxacin] Itching and Other (See Comments)    Severe itching; prickly sensation; severe left leg pain; immobility.   Enalapril  Other (See Comments)    hallucinations   Latex Rash   Tape Rash and Other (See Comments)    Outpatient Medications Prior to Visit  Medication Sig   albuterol  (VENTOLIN  HFA) 108 (90 Base) MCG/ACT inhaler Inhale 1-2 puffs into the lungs every 6 (six) hours as needed for wheezing or shortness of breath.   apixaban  (ELIQUIS ) 5 MG TABS tablet Take 5 mg by mouth 2 (two) times daily.   hydrOXYzine  (ATARAX /VISTARIL ) 50 MG tablet Take 50 mg by mouth at bedtime.   levothyroxine  (SYNTHROID ) 75 MCG tablet Take 75 mcg by mouth daily before breakfast.    lidocaine -prilocaine  (EMLA ) cream Apply 1 Application topically once.   metoprolol  succinate (TOPROL -XL) 25 MG 24 hr tablet Take 1  tablet by mouth every morning.   midodrine  (PROAMATINE ) 10 MG tablet Take 1 tablet (10 mg total) by mouth 3 (three) times daily.   oxyCODONE -acetaminophen  (PERCOCET/ROXICET) 5-325 MG tablet Take 1-2 tablets by mouth every 6 (six) hours as needed for severe pain (pain score 7-10).   pantoprazole  (PROTONIX ) 40 MG tablet Take 40 mg by mouth every morning.   ranolazine  (RANEXA ) 1000 MG SR tablet Take 1,000 mg by mouth 2 (two) times daily.   rosuvastatin  (CRESTOR ) 20 MG tablet Take 20 mg by mouth at bedtime.   sevelamer  carbonate (RENVELA ) 800 MG tablet Take 3,200 mg by mouth 3 (three) times daily with meals.   triamcinolone  cream (KENALOG ) 0.1 % Apply 1 Application topically 2 (two) times daily.   Facility-Administered Medications Prior to Visit  Medication Dose Route Frequency Provider   heparin  injection    PRN Marea Selinda RAMAN, MD    Review of Systems  Constitutional: Negative.   HENT: Negative.    Eyes: Negative.   Respiratory: Negative.  Negative for shortness of breath.   Cardiovascular: Negative.  Negative for chest pain.  Gastrointestinal: Negative.  Negative for abdominal pain, constipation and diarrhea.  Genitourinary: Negative.   Musculoskeletal:  Positive for joint pain. Negative for myalgias.       Left foot edema  Skin: Negative.   Neurological:  Positive for numbness. Negative for dizziness, tingling and headaches.  Endo/Heme/Allergies: Negative.   All other systems reviewed and are negative.      Objective:   BP (!) 132/95   Pulse 69   Ht 5' 6 (1.676 m)   Wt 161 lb (73 kg)   SpO2 99%   BMI 25.99 kg/m   Vitals:   04/13/24 1012  BP: (!) 132/95  Pulse: 69  Height: 5' 6 (1.676 m)  Weight: 161 lb (73 kg)  SpO2: 99%  BMI (Calculated): 26    Physical Exam Vitals and nursing note reviewed.  Constitutional:      Appearance: Normal appearance. She  is normal weight.  HENT:     Head: Normocephalic and atraumatic.     Nose: Nose normal.     Mouth/Throat:      Mouth: Mucous membranes are moist.  Eyes:     Extraocular Movements: Extraocular movements intact.     Conjunctiva/sclera: Conjunctivae normal.     Pupils: Pupils are equal, round, and reactive to light.  Cardiovascular:     Rate and Rhythm: Normal rate and regular rhythm.     Pulses: Normal pulses.     Heart sounds: Normal heart sounds.  Pulmonary:     Effort: Pulmonary effort is normal.     Breath sounds: Normal breath sounds.  Abdominal:     General: Abdomen is flat. Bowel sounds are normal.     Palpations: Abdomen is soft.  Musculoskeletal:        General: Normal range of motion.     Cervical back: Normal range of motion.  Skin:    General: Skin is warm and dry.  Neurological:     General: No focal deficit present.     Mental Status: She is alert and oriented to person, place, and time.  Psychiatric:        Mood and Affect: Mood normal.        Behavior: Behavior normal.        Thought Content: Thought content normal.        Judgment: Judgment normal.      Results for orders placed or performed during the hospital encounter of 04/13/24  Basic metabolic panel  Result Value Ref Range   Sodium 140 135 - 145 mmol/L   Potassium 4.2 3.5 - 5.1 mmol/L   Chloride 99 98 - 111 mmol/L   CO2 23 22 - 32 mmol/L   Glucose, Bld 81 70 - 99 mg/dL   BUN 20 8 - 23 mg/dL   Creatinine, Ser 4.51 (H) 0.44 - 1.00 mg/dL   Calcium  8.4 (L) 8.9 - 10.3 mg/dL   GFR, Estimated 8 (L) >60 mL/min   Anion gap 18 (H) 5 - 15    Recent Results (from the past 2160 hours)  Type and screen [facility]     Status: None   Collection Time: 02/01/24 10:26 AM  Result Value Ref Range   ABO/RH(D) O POS    Antibody Screen POS    Sample Expiration 02/15/2024,2359    Extend sample reason NO TRANSFUSIONS OR PREGNANCY IN THE PAST 3 MONTHS    Antibody Identification NON SPECIFIC ANTIBODY REACTIVITY    DAT, IgG NEG    DAT, complement      NEG Performed at [facility], [address] [place], [place] [address]   CBC     Status: Abnormal   Collection Time: 02/01/24 10:26 AM  Result Value Ref Range   WBC 4.9 4.0 - 10.5 K/uL   RBC 4.19 3.87 - 5.11 MIL/uL   Hemoglobin 13.5 12.0 - 15.0 g/dL   HCT 58.7 63.9 - 53.9 %   MCV 98.3 80.0 - 100.0 fL   MCH 32.2 26.0 - 34.0 pg   MCHC 32.8 30.0 - 36.0 g/dL   RDW 84.3 (H) 88.4 - 84.4 %   Platelets 116 (L) 150 - 400 K/uL   nRBC 0.0 0.0 - 0.2 %    Comment: Performed at [facility], [address], [place], [place] [address]  Basic metabolic panel     Status: Abnormal   Collection Time: 02/01/24 10:26 AM  Result Value Ref Range   Sodium 138 135 - 145 mmol/L   Potassium 4.3 3.5 - 5.1 mmol/L   Chloride 97 (L) 98 - 111 mmol/L   CO2 24 22 - 32 mmol/L   Glucose, Bld 72 70 - 99 mg/dL    Comment: Glucose reference range applies only to samples taken after fasting for at least 8 hours.   BUN 46 (H) 8 - 23 mg/dL   Creatinine, Ser 2.97 (H) 0.44 - 1.00 mg/dL   Calcium  8.5 (L) 8.9 - 10.3 mg/dL   GFR, Estimated 6 (L) >60 mL/min    Comment: (NOTE) Calculated using the CKD-EPI Creatinine Equation (2021)    Anion gap 17 (H) 5 - 15    Comment: Performed at [facility], [address], [place], [place] [address]  Surgical pcr screen     Status: Abnormal   Collection Time: 02/01/24 10:35 AM   Specimen: Nasal Mucosa; Nasal Swab  Result Value Ref Range   MRSA, PCR POSITIVE (A) NEGATIVE    Comment: RESULT CALLED TO, READ BACK BY AND VERIFIED WITH: BRYAN GRAY AT 1416 ON 02/01/24 BY SS    Staphylococcus aureus POSITIVE (A) NEGATIVE    Comment: (NOTE) The Xpert SA Assay (FDA approved for NASAL specimens in patients 74 years of age and older), is one component of a comprehensive surveillance program. It is not intended to diagnose infection nor to guide or monitor treatment. Performed at [facility], [address], [place], [place] [address]   Potassium [facility] vascular lab only)     Status: Abnormal    Collection Time: 02/07/24  8:46 AM  Result Value Ref Range   Potassium [facility] vascular lab) 5.7 (H) 3.5 - 5.1 mmol/L    Comment: Performed at [facility], [address] [place], [place] [address]  Potassium     Status: Abnormal   Collection Time: 02/07/24  9:07 AM  Result Value Ref Range   Potassium 5.4 (H) 3.5 - 5.1 mmol/L    Comment: Performed at [facility], [address], [place], [place] [address]  Basic metabolic panel     Status: Abnormal   Collection Time: 03/07/24 11:11 AM  Result Value Ref Range   Sodium 139 135 - 145 mmol/L   Potassium 4.0 3.5 - 5.1 mmol/L   Chloride 97 (L) 98 - 111 mmol/L   CO2 24 22 - 32 mmol/L   Glucose, Bld 88 70 - 99 mg/dL    Comment: Glucose reference range applies only to samples taken after fasting for at least 8 hours.   BUN 52 (H) 8 - 23 mg/dL   Creatinine, Ser 3.03 (H) 0.44 - 1.00 mg/dL   Calcium  8.7 (L) 8.9 - 10.3 mg/dL   GFR, Estimated 6 (L) >60 mL/min    Comment: (NOTE) Calculated using the CKD-EPI Creatinine Equation (2021)    Anion gap 18 (H) 5 - 15    Comment: Performed at [facility], [address] [place], [place] [address]  Type and screen [facility]     Status: None   Collection Time: 03/07/24 11:11 AM  Result Value Ref Range   ABO/RH(D) O POS    Antibody Screen NEG    Sample Expiration 03/21/2024,2359    Extend sample reason NO TRANSFUSIONS OR PREGNANCY IN THE PAST 3 MONTHS    Unit Number [id number]    Blood Component Type RED CELLS,LR    Unit division 00    Status of  Unit REL FROM [facility]    Transfusion Status OK TO TRANSFUSE    Crossmatch Result COMPATIBLE    Unit Number [id number]    Blood Component Type RBC LR PHER1    Unit division 00    Status of Unit REL FROM [facility]    Transfusion Status OK TO TRANSFUSE    Crossmatch Result COMPATIBLE   Surgical pcr screen     Status: Abnormal   Collection Time: 03/07/24 11:11 AM   Specimen: Nasal Mucosa; Nasal Swab   Result Value Ref Range   MRSA, PCR POSITIVE (A) NEGATIVE    Comment: RESULT CALLED TO, READ BACK BY AND VERIFIED WITH: BRYAN GRAY 03/07/24 1409 MW    Staphylococcus aureus POSITIVE (A) NEGATIVE    Comment: (NOTE) The Xpert SA Assay (FDA approved for NASAL specimens in patients 27 years of age and older), is one component of a comprehensive surveillance program. It is not intended to diagnose infection nor to guide or monitor treatment. Performed at [facility], [address] [place], [place] [address]   CBC with Differential/Platelet     Status: Abnormal   Collection Time: 03/07/24 11:11 AM  Result Value Ref Range   WBC 6.4 4.0 - 10.5 K/uL   RBC 4.52 3.87 - 5.11 MIL/uL   Hemoglobin 14.3 12.0 - 15.0 g/dL   HCT 55.0 63.9 - 53.9 %   MCV 99.3 80.0 - 100.0 fL   MCH 31.6 26.0 - 34.0 pg   MCHC 31.8 30.0 - 36.0 g/dL   RDW 84.2 (H) 88.4 - 84.4 %   Platelets 146 (L) 150 - 400 K/uL   nRBC 0.0 0.0 - 0.2 %   Neutrophils Relative % 67 %   Neutro Abs 4.3 1.7 - 7.7 K/uL   Lymphocytes Relative 19 %   Lymphs Abs 1.2 0.7 - 4.0 K/uL   Monocytes Relative 12 %   Monocytes Absolute 0.8 0.1 - 1.0 K/uL   Eosinophils Relative 1 %   Eosinophils Absolute 0.0 0.0 - 0.5 K/uL   Basophils Relative 0 %   Basophils Absolute 0.0 0.0 - 0.1 K/uL   Immature Granulocytes 1 %   Abs Immature Granulocytes 0.03 0.00 - 0.07 K/uL    Comment: Performed at [facility], [address] [place], [place] [address]  BPAM RBC     Status: None   Collection Time: 03/07/24 11:11 AM  Result Value Ref Range   Blood Product Unit Number [id number]    PRODUCT CODE E0382V00    Unit Type and Rh 5100    Blood Product Expiration Date 797489817640    Blood Product Unit Number [id number]    PRODUCT CODE Z5467C99    Unit Type and Rh 5100    Blood Product Expiration Date 797489817640   I-STAT, chem 8     Status: Abnormal   Collection Time: 03/09/24  6:51 AM  Result Value Ref Range   Sodium 132 (L)  135 - 145 mmol/L   Potassium 5.6 (H) 3.5 - 5.1 mmol/L   Chloride 104 98 - 111 mmol/L   BUN 67 (H) 8 - 23 mg/dL   Creatinine, Ser 3.39 (H) 0.44 - 1.00 mg/dL   Glucose, Bld 896 (H) 70 - 99 mg/dL    Comment: Glucose reference range applies only to samples taken after fasting for at least 8 hours.   Calcium , Ion 0.71 (LL) 1.15 - 1.40 mmol/L   TCO2 24 22 - 32 mmol/L   Hemoglobin 15.0 12.0 - 15.0 g/dL  HCT 44.0 36.0 - 46.0 %  Potassium     Status: Abnormal   Collection Time: 03/09/24  7:15 AM  Result Value Ref Range   Potassium 3.4 (L) 3.5 - 5.1 mmol/L    Comment: Performed at [facility], [address], [place], [place] [address]  Potassium [facility] vascular lab only)     Status: Abnormal   Collection Time: 03/30/24 11:54 AM  Result Value Ref Range   Potassium [facility] vascular lab) 6.2 (H) 3.5 - 5.1 mmol/L    Comment: Performed at [facility], [address] [place], [place] [address]  Potassium     Status: Abnormal   Collection Time: 03/30/24 12:42 PM  Result Value Ref Range   Potassium 5.7 (H) 3.5 - 5.1 mmol/L    Comment: Performed at [facility], [address], [place], [place] [address]  Basic metabolic panel     Status: Abnormal   Collection Time: 04/13/24  9:37 AM  Result Value Ref Range   Sodium 140 135 - 145 mmol/L   Potassium 4.2 3.5 - 5.1 mmol/L   Chloride 99 98 - 111 mmol/L   CO2 23 22 - 32 mmol/L   Glucose, Bld 81 70 - 99 mg/dL    Comment: Glucose reference range applies only to samples taken after fasting for at least 8 hours.   BUN 20 8 - 23 mg/dL   Creatinine, Ser 4.51 (H) 0.44 - 1.00 mg/dL   Calcium  8.4 (L) 8.9 - 10.3 mg/dL   GFR, Estimated 8 (L) >60 mL/min    Comment: (NOTE) Calculated using the CKD-EPI Creatinine Equation (2021)    Anion gap 18 (H) 5 - 15    Comment: Performed at [facility], [address] [place], [place] [address]      Assessment & Plan:  Uric acid today Colchicine 0.6 mg once Referral to  podiatry sent  Problem List Items Addressed This Visit       Cardiovascular and Mediastinum   Hypertension     Musculoskeletal and Integument   Acute idiopathic gout of left foot - Primary   Relevant Medications   colchicine 0.6 MG tablet   Other Relevant Orders   Uric acid   Ambulatory referral to Podiatry   Bunion of great toe   Relevant Orders   Ambulatory referral to Podiatry     Genitourinary   ESRD on hemodialysis (HCC) (Chronic)    Return if symptoms worsen or fail to improve, for as scheduled.   Total time spent: 25 minutes. This time includes review of previous notes and results and patient face to face interaction during today's visit.    Jeoffrey Pollen, NP  04/13/2024   This document may have been prepared by [employer] Voice Recognition software and as such may include unintentional dictation errors.

## 2024-04-17 ENCOUNTER — Encounter: Payer: Self-pay | Admitting: Vascular Surgery

## 2024-04-17 NOTE — Progress Notes (Signed)
 Perioperative / Anesthesia Services  Pre-Admission Testing Clinical Review / Pre-Operative Anesthesia Consult  Date: 04/17/24  PATIENT DEMOGRAPHICS: Name: [name] DOB: 03/14/1961 MRN:   [id number]  Note: Available PAT nursing documentation and vital signs have been reviewed. Clinical nursing staff has updated patient's PMH/PSHx, current medication list, and drug allergies/intolerances to ensure complete and comprehensive history available to assist care teams in MDM as it pertains to the aforementioned surgical procedure and anticipated anesthetic course. Extensive review of available clinical information personally performed. Nursing documentation reviewed. Gordon PMH and PSHx updated with any diagnoses and/or procedures that I have knowledge of that may have been inadvertently omitted during her intake with the pre-admission testing department's nursing staff.  PLANNED SURGICAL PROCEDURE(S):   Case: [id number] Date/Time: 04/20/24 0715   Procedures:      LIGATION ARTERIOVENOUS GORTEX GRAFT (Right)     INSERTION OF DIALYSIS CATHETER   Anesthesia type: General   Diagnosis: End stage renal disease (HCC) [N18.6]   Pre-op  diagnosis: ESRD   Location: [facility] OR ROOM 08 / [facility]   Surgeons: Marea Selinda RAMAN, MD        CLINICAL DISCUSSION: [name] is a 63 y.o. female who is submitted for pre-surgical anesthesia review and clearance prior to her undergoing the above procedure.  Patient is a Current Smoker (5 pack years). Pertinent PMH includes: CAD (s/p CABG), multiple NSTEMI's, multiple CVAs, chronic cerebral microvascular disease, atrial fibrillation/flutter, ischemic cardiomyopathy with resulting HFrEF, PVD, aortic atherosclerosis, cardiomegaly, RBBB, HTN, HLD, hypothyroidism, ESRD on hemodialysis (M, W, F), pulmonary hypertension, dyspnea, COPD, chronic hypoxic respiratory failure, GERD (on daily PPI), anemia of chronic renal disease, HCV, cervical DDD,  right lumbar DDD, renal osteodystrophy, bipolar disorder, substance abuse (crack cocaine).   Patient is followed by cardiology Orvil, MD). She was last seen in the cardiology clinic on 04/13/2024; notes reviewed. At the time of her clinic visit, patient complained of vertiginous symptoms and shortness of breath in the setting of hypotension. Patient denied any chest pain, PND, orthopnea, palpitations, significant peripheral edema, weakness, fatigue, or presyncope/syncope. Patient with a past medical history significant for cardiovascular diagnoses. Documented physical exam was grossly benign, providing no evidence of acute exacerbation and/or decompensation of the patient's known cardiovascular conditions.  Patient suffered an NSTEMI on 01/23/2002.  Diagnostic LEFT heart catheterization was performed the following day revealing multivessel CAD with RA determined to be a 90% occlusion in OM2.  PCI was delayed until the following day, at which time patient underwent placement of a 3.0 x 23 mm BX velocity stent to OM2.  Procedure yielded excellent angiographic result and TIMI-3 flow.   MRI imaging of the brain performed on 05/23/2010 revealed BILATERAL acute infarcts and old prior infarcts.  Area of acute insult was noted to the RIGHT frontal lobe extending into the white matter.  Additionally, there was a small focus of restricted diffusion in the cortex of the medial LEFT frontoparietal lobe.  Periventricular T2 and multiple foci of T2 hyperintensity in the subcortical white matter and increased FLAIR signal in the cortex of the LEFT frontal lobe.   Patient underwent diagnostic LEFT heart catheterization on 05/25/2012.  Procedure revealed multivessel CAD; 50% LM, 90% ostial LAD, 50% mid LAD, 50% distal LAD, 75% ostial LCx, 20% ISR proximal LCx, 75% ISR mid LCx, 50% ostial RCA, 25% ISR proximal RCA, 90% mid RCA, 100% RPDA, and 60% RPLS.  Cardiology made the decision to defer further intervention at that time  opting for  secondary prevention through medical management.   Patient suffered an NSTEMI on 10/08/2013.  Diagnostic LEFT heart catheterization was performed on 10/09/2013 revealing multivessel CAD; 25% distal LM, 25% proximal LAD, 50% ISR proximal LCx, 20% proximal RCA, 90% ISR mid RCA-1, and 75% ISR mid RCA-2. Given the degree and complexity of patient's coronary artery disease, patient was referred to CVTS for consideration of revascularization.   Patient ultimately underwent revascularization on 02/20/2014 at [facility].  LIMA-LAD, SVG-OM1, and SVG-PDA bypass grafts were placed.  Procedure was complicated by postoperative CVA on POD 1. Patient developed RIGHT upper/lower extremity weakness.  Patient was reintubated.  Neurology was consulted and CT imaging obtained that showed hypodensity noted in the area of the LEFT motor cortex consistent with a component of acute ischemia.  Symptoms improved and patient was able to be extubated following day.   MRI imaging of the brain performed on 03/03/2014 revealed 2 acute punctate lacunar infarcts anteriorly in the BILATERAL centrum semiovale.     Most recent myocardial perfusion imaging study was performed on 04/01/2023 revealing a severely reduced left ventricular systolic function with an EF of 24%.  Global hypokinesis was observed.  No artifact or left ventricular cavity size enlargement appreciated on review of imaging. SPECT images demonstrated a large severe fixed partially reversible basal and mid anterolateral and anterolateral perfusion defect.  Additionally, there was fixed basal mid and apical inferior wall defects noted.  Results consistent with infarction in the LCx/RCA territories.  Most recent TTE performed on 03/24/2024 revealed a severely reduced left ventricular systolic function with an EF of 25%.  There is mild LVH.  There was septal, anterior, and apical hypokinesis.  Posterior, inferior, and lateral walls all akinetic.  Left ventricular diastolic Doppler parameters were indeterminate.SABRA LEFT atrium moderately enlarged.  RIGHT atrium severely enlarged.  Right ventricular size normal with a moderately decreased systolic function.  RVSP = 59 mmHg consistent with patient's known pulmonary hypertension.  There was trivial pulmonary, moderate tricuspid, and mild mitral valve regurgitation.  All transvalvular gradients were noted to be normal providing no evidence of hemodynamically significant valvular stenosis. Aorta normal in size with no evidence of ectasia or aneurysmal dilatation.  Patient with an atrial fibrillation diagnosis; CHA2DS2-VASc Score = 6 (sex, HFrEF, HTN, CVA x 2, prior MI/vascular disease). Her rate and rhythm are currently being maintained on oral metoprolol  succinate.  Patient is on ranolazine  for anginal symptom prevention.  She is chronically anticoagulated using apixaban ; reported to be compliant with therapy with no evidence or reports of GI/GU bleeding.  Blood pressure well controlled at 90/60 mmHg on currently prescribed beta-blocker (metoprolol  succinate) monotherapy.  Due to her hemodialysis and past cardiovascular history, patient with documented labile blood pressures, which is being controlled with midodrine .  She is on rosuvastatin  for her HLD diagnosis and further ASCVD prevention.  Patient is not diabetic.  She does not have an OSAH diagnosis. Patient is able to complete all of her  ADL/IADLs without cardiovascular limitation.  Per the DASI, patient is able to achieve at least 4 METS of physical activity without experiencing any significant degree of angina/anginal equivalent symptoms.  No changes were made to her medication regimen.  Patient to follow-up with outpatient cardiology in 2 months or sooner if needed.   Nicolette Gieske Cardamone is scheduled for an elective LIGATION ARTERIOVENOUS GORTEX GRAFT (Right); INSERTION OF DIALYSIS CATHETER on 04/21/2023 with Dr. Selinda GORMAN Gu, MD. Given patient's past  medical history significant for cardiovascular diagnoses, presurgical cardiac clearance  was sought by the PAT team. Per cardiology, this patient is optimized for surgery and may proceed with the planned procedural course with a :PW risk of significant perioperative cardiovascular complications.  Again, this patient is on daily oral anticoagulation therapy using a DOAC medication.  She has been instructed on recommendations for holding her apixaban  for 1 day prior to her procedure with plans to restart as soon as postoperative bleeding risk felt to be minimized by his primary attending surgeon. The patient has been instructed that her last dose should be on 04/18/2024.  Patient denies previous perioperative complications with anesthesia in the past. In review her EMR, it is noted that patient underwent a general anesthetic course here at [facility] ([facility] IV) in 03/2024 without documented complications.   MOST RECENT VITAL SIGNS:    04/13/2024   10:12 AM 04/13/2024    9:24 AM 04/06/2024    1:10 PM  Vitals with BMI  Height 5' 6 5' 6 5' 6  Weight 161 lbs 161 lbs 161 lbs 3 oz  BMI 26 26 26.03  Systolic 132 132 861  Diastolic 95 95 100  Pulse 69 69 112   PROVIDERS/SPECIALISTS: NOTE: Primary physician provider listed below. Patient may have been seen by APP or partner within same practice.   PROVIDER ROLE / SPECIALTY LAST SHERLEAN Marea Selinda GORMAN, MD Vascular Surgery (Surgeon) 04/06/2024  Orlean Alan HERO, FNP Primary Care Provider 04/13/2024  Fernand Alter, MD Cardiology 02/17/2024  Lateef, Munsoor, MD Nephrology ???  Lane Hussar, MD Neurology 06/25/2022   ALLERGIES: Allergies  Allergen Reactions   Morphine  And Codeine Hives   Levaquin [Levofloxacin] Itching and Other (See Comments)    Severe itching; prickly sensation; severe left leg pain; immobility.   Enalapril  Other (See Comments)    hallucinations   Latex Rash   Tape Rash and Other (See  Comments)    CURRENT HOME MEDICATIONS: No current facility-administered medications for this encounter.    albuterol  (VENTOLIN  HFA) 108 (90 Base) MCG/ACT inhaler   apixaban  (ELIQUIS ) 5 MG TABS tablet   colchicine 0.6 MG tablet   hydrOXYzine  (ATARAX /VISTARIL ) 50 MG tablet   levothyroxine  (SYNTHROID ) 75 MCG tablet   lidocaine -prilocaine  (EMLA ) cream   metoprolol  succinate (TOPROL -XL) 25 MG 24 hr tablet   midodrine  (PROAMATINE ) 10 MG tablet   oxyCODONE -acetaminophen  (PERCOCET/ROXICET) 5-325 MG tablet   pantoprazole  (PROTONIX ) 40 MG tablet   ranolazine  (RANEXA ) 1000 MG SR tablet   rosuvastatin  (CRESTOR ) 20 MG tablet   sevelamer  carbonate (RENVELA ) 800 MG tablet   triamcinolone  cream (KENALOG ) 0.1 %    heparin  injection   HISTORY: Past Medical History:  Diagnosis Date   Acute lacunar infarction (HCC) 03/03/2014   a.) MRI brain 03/03/2014: two acute punctate lacunar infarcts anteriorly in the BILATERAL centrum semiovale   Adult behavior problems    Anemia of chronic renal failure    Aortic atherosclerosis    Arthritis    Atrial fibrillation and flutter (HCC)    a.) CHA2DS2VASc = 6 (sex, HFrEF, HTN, CVA x 2, prior MI/vascular disease) as of 02/04/2024; b.) rate/rhythm maintained on oral metoprolol  succinate; chronically anticoagulated with apixaban    Bipolar disorder (HCC)    Cardiomegaly    Cerebral microvascular disease    CHF (congestive heart failure) (HCC)    Chicken pox    Cholelithiasis    Chronic respiratory failure with hypoxia (HCC) 12/19/2021   COPD (chronic obstructive pulmonary disease) (HCC)    Coronary artery disease  DDD (degenerative disc disease), cervical    DDD (degenerative disc disease), thoracolumbar    Diverticulosis    Dyspnea    ESRD (end stage renal disease) on dialysis (M,W,F)    GERD (gastroesophageal reflux disease)    Gout    HCV (hepatitis C virus)    Headache    HFrEF (heart failure with reduced ejection fraction) (HCC)    History of  delirium 04/12/2014   History of substance abuse (HCC)    a.) tobacco + cocaine   Hyperkalemia 11/13/2014   Hyperlipidemia    Hypertension    Hypotension 02/24/2014   Hypothyroidism    Ischemic cardiomyopathy    Nose colonized with MRSA 02/01/2024   a.) presurgical PCR (+) on 02/01/2024 prior to A/V FISTULAGRAM; b.) presurgical PCR (+) on 03/07/2024 prior to AV FISTULA CREATION   NSTEMI (non-ST elevated myocardial infarction) (HCC) 10/08/2013   NSTEMI (non-ST elevated myocardial infarction) (HCC) 01/23/2002   a.) LHC 01/24/2002 --> multi-vessel CAD with IRA being 90% OM2 --> delayed PCI until 01/25/2002 --> 3.0 x 23 mm BX Velocity stent   Obesity    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    On apixaban  therapy    Peripheral vascular disease    Pneumonia    Postoperative anemia due to acute blood loss 02/27/2014   Postoperative cerebrovascular infarction following cardiac surgery 02/21/2014   a.) developed RIGHT upper/lower extremity weakness following cardiac revascularization --> neuro consult and head CT --> Hypodensity noted in the area of the LEFT motor cortex consistent with a component of acute ischemia   Pulmonary hypertension (HCC)    RBBB (right bundle branch block)    Renal osteodystrophy    S/P CABG x 3 02/20/2014   a.) LIMA-LAD, SVG-OM1, SVG-PDA   Stroke (HCC) 05/23/2010   a.) brain MRI 05/23/2010: BILATERAL acute infarcts and old prior infarcts; RIGHT frontal lobe extending into the white matter. Small focus of restricted diffusion in the cortex of the medial LEFT frontoparietal lobe. Periventricular T2 and multiple foci of T2 hyperintensity in the subcortical white matter and increased FLAIR signal in the cortex of the LEFT frontal lobe.   Syncope and collapse 07/25/2020   Past Surgical History:  Procedure Laterality Date   A/V FISTULAGRAM Left 03/30/2019   Procedure: A/V FISTULAGRAM;  Surgeon: Marea Selinda RAMAN, MD;  Location: [facility];  Service:  Cardiovascular;  Laterality: Left;   A/V FISTULAGRAM Left 10/06/2019   Procedure: A/V FISTULAGRAM;  Surgeon: Marea Selinda RAMAN, MD;  Location: [facility];  Service: Cardiovascular;  Laterality: Left;   A/V FISTULAGRAM Left 05/19/2021   Procedure: A/V FISTULAGRAM;  Surgeon: Marea Selinda RAMAN, MD;  Location: [facility];  Service: Cardiovascular;  Laterality: Left;   A/V FISTULAGRAM Left 01/29/2022   Procedure: A/V Fistulagram;  Surgeon: Marea Selinda RAMAN, MD;  Location: [facility];  Service: Cardiovascular;  Laterality: Left;   A/V FISTULAGRAM Left 04/30/2022   Procedure: A/V Fistulagram;  Surgeon: Marea Selinda RAMAN, MD;  Location: [facility];  Service: Cardiovascular;  Laterality: Left;   A/V FISTULAGRAM Left 08/20/2022   Procedure: A/V Fistulagram;  Surgeon: Marea Selinda RAMAN, MD;  Location: [facility];  Service: Cardiovascular;  Laterality: Left;   A/V FISTULAGRAM N/A 12/03/2022   Procedure: A/V Fistulagram;  Surgeon: Marea Selinda RAMAN, MD;  Location: [facility];  Service: Cardiovascular;  Laterality: N/A;   A/V FISTULAGRAM Left 02/11/2023   Procedure: A/V Fistulagram;  Surgeon: Marea,  Selinda RAMAN, MD;  Location: [facility];  Service: Cardiovascular;  Laterality: Left;   A/V FISTULAGRAM Left 02/07/2024   Procedure: A/V Fistulagram;  Surgeon: Marea Selinda RAMAN, MD;  Location: [facility];  Service: Cardiovascular;  Laterality: Left;   COLONOSCOPY     COLONOSCOPY WITH PROPOFOL  N/A 04/22/2021   Procedure: COLONOSCOPY WITH PROPOFOL ;  Surgeon: Maryruth Ole DASEN, MD;  Location: [facility];  Service: Endoscopy;  Laterality: N/A;   CORONARY ANGIOPLASTY WITH STENT PLACEMENT Left 01/25/2002   Procedure: CORONARY ANGIOPLASTY WITH STENT PLACEMENT; Location: [facility]; Surgeon: Margie Lovelace, MD   CORONARY ARTERY BYPASS GRAFT N/A 02/20/2014   Procedure: CORONARY ARTERY BYPASS GRAFT; Location: [facility]; Surgeon: Reyes Fruits, MD   DIALYSIS FISTULA CREATION      DIALYSIS/PERMA CATHETER INSERTION N/A 04/08/2023   Procedure: DIALYSIS/PERMA CATHETER INSERTION;  Surgeon: Marea Selinda RAMAN, MD;  Location: [facility];  Service: Cardiovascular;  Laterality: N/A;   DIALYSIS/PERMA CATHETER REMOVAL N/A 07/06/2023   Procedure: DIALYSIS/PERMA CATHETER REMOVAL;  Surgeon: [name] Cordella MATSU, MD;  Location: [facility];  Service: Cardiovascular;  Laterality: N/A;   ESOPHAGOGASTRODUODENOSCOPY     ESOPHAGOGASTRODUODENOSCOPY N/A 04/22/2021   Procedure: ESOPHAGOGASTRODUODENOSCOPY (EGD);  Surgeon: Maryruth Ole DASEN, MD;  Location[facility] ENDOSCOPY;  Service: Endoscopy;  Laterality: N/A;   FLEXIBLE SIGMOIDOSCOPY N/A 02/23/2019   Procedure: FLEXIBLE SIGMOIDOSCOPY;  Surgeon: Gaylyn Gladis PENNER, MD;  Location: [facility] ENDOSCOPY;  Service: Endoscopy;  Laterality: N/A;   IABP INSERTION  02/20/2014   Procedure: INTRA-AORTIC BALLOON PUMP INSERTION; Location: [facility]; Surgeon: Reyes Fruits, MD   INSERTION OF ARTERIOVENOUS (AV) ARTEGRAFT ARM Right 03/09/2024   Procedure: INSERTION, GRAFT, ARTERIOVENOUS, UPPER EXTREMITY;  Surgeon: Marea Selinda RAMAN, MD;  Location: [facility] ORS;  Service: Vascular;  Laterality: Right;  BRACHIAL AXILLARY   LEFT HEART CATH AND CORONARY ANGIOGRAPHY Left 01/24/2002   Procedure: LEFT HEART CATH AND CORONARY ANGIOGRAPHY; Location: [facility]; Surgeon: Margie Lovelace, MD   LEFT HEART CATH AND CORONARY ANGIOGRAPHY Left 05/25/2012   Procedure: LEFT HEART CATH AND CORONARY ANGIOGRAPHY; Location: [facility]; Surgeon: Margie Lovelace, MD   LEFT HEART CATH AND CORONARY ANGIOGRAPHY Left 10/09/2013   Procedure: LEFT HEART CATH AND CORONARY ANGIOGRAPHY; Location: [facility]; Surgeon: Vinie Jude, MD   LEFT HEART CATH AND CORS/GRAFTS ANGIOGRAPHY N/A 08/08/2018   Procedure: LEFT HEART CATH AND CORS/GRAFTS ANGIOGRAPHY;  Surgeon: Fernand Denyse LABOR, MD;  Location: [facility];  Service: Cardiovascular;  Laterality: N/A;   LEFT HEART CATH AND CORS/GRAFTS ANGIOGRAPHY N/A 01/30/2019    Procedure: LEFT HEART CATH AND CORS/GRAFTS ANGIOGRAPHY;  Surgeon: Fernand Denyse LABOR, MD;  Location: [facility];  Service: Cardiovascular;  Laterality: N/A;   ultrasound guided pericardiocentesis     UPPER EXTREMITY ANGIOGRAPHY Right 03/30/2024   Procedure: Upper Extremity Angiography;  Surgeon: Marea Selinda RAMAN, MD;  Location: [facility];  Service: Cardiovascular;  Laterality: Right;   Family History  Problem Relation Age of Onset   Hypertension Mother    Ovarian cancer Mother    Diabetes type II Mother    Hypertension Father    Kidney disease Father    Hypertension Sister    Diabetes type II Maternal Grandmother    Breast cancer Maternal Grandmother    Breast cancer Maternal Aunt    Hypertension Other    Cancer Other    Renal Disease Other    Social History   Tobacco Use   Smoking status: Every Day    Current packs/day: 0.25    Average  packs/day: 0.3 packs/day for 20.0 years (5.0 ttl pk-yrs)    Types: Cigarettes   Smokeless tobacco: Never   Tobacco comments:    smokes 1 - 1.5 cigarettes a day  Substance Use Topics   Alcohol use: No    Comment: social   LABS:  Lab Results  Component Value Date   WBC 6.4 03/07/2024   HGB 15.0 03/09/2024   HCT 44.0 03/09/2024   MCV 99.3 03/07/2024   PLT 146 (L) 03/07/2024   Lab Results  Component Value Date   NA 140 04/13/2024   CL 99 04/13/2024   K 4.2 04/13/2024   CO2 23 04/13/2024   BUN 20 04/13/2024   CREATININE 5.48 (H) 04/13/2024   GFRNONAA 8 (L) 04/13/2024   CALCIUM  8.4 (L) 04/13/2024   PHOS 4.4 05/19/2023   ALBUMIN  3.6 05/19/2023   GLUCOSE 81 04/13/2024    ECG: Date: 03/07/2024  Time ECG obtained: 1121 AM Rate: 109 bpm Rhythm: Sinus tachycardia + RBBB Axis (leads I and aVF): normal Intervals: PR 154 ms. QRS 170 ms. QTc 606 ms. ST segment and T wave changes: No evidence of acute T wave abnormalities or significant ST segment elevation or depression.  Evidence of a possible, age undetermined, prior  infarct:  Yes; inferolateral Comparison: Similar to previous tracing obtained on 08/27/2023   IMAGING / PROCEDURES: TRANSTHORACIC ECHOCARDIOGRAM performed on 03/24/2024 Technically adequate study.  LA Moderately Enlarged  RA Severely Enlarged  Severe LV Systolic Dysfunction  Moderate RV Systolic Dysfunction  Mild left ventricular hypertrophy  Right ventricular diastolic dysfunction.  LV  Regional Wall Motion Abnormalities  Septal, Anterior, Apex walls- Hypokinesis  Posterior, Inferior, Lateral walls- Akinetic  Abnormal right ventricular wall motion.  Trace pulmonary regurgitation.  Moderate to Severe tricuspid regurgitation.  Moderate pulmonary hypertension; RVSP = 59 mmHg Mild mitral regurgitation.  No pericardial effusion. Dilated IVC Simpson's Biplane 20% Unable to determine LV diastolic dysfunction due to patient being in  Atrial Fibrillation.    CT ANGIO HEAD W OR WO CONTRAST performed on 09/30/2023 No acute intracranial abnormality.   Multiple chronic infarcts. Diminished flow within the visualized extracranial left vertebral artery. Absent or minimal flow within the intracranial left vertebral with reconstitution distally from retrograde flow. This reflects a change from 2016 MRA head.   MYOCARDIAL PERFUSION IMAGING STUDY (LEXISCAN ) performed on 04/01/2023 Severely reduced left ventricular systolic function with an EF of 24% Dilated left ventricle Diffuse hypokinesis Large severe fixed partially reversible basal, mid anterolateral, and anterolateral wall perfusion defects Fixed basal mid and apical inferior wall perfusion defect Findings consistent with infarction in the LCx/RCA territories with severe LV dysfunction, dilated left ventricle, and diffuse hypokinesis.   CT CERVICAL/THORACIC/LUMBAR SPINE WO CONTRAST performed on 02/16/2022 No acute intracranial CT findings or intracranial interval changes. Bilateral chronic infarcts, atrophy and small-vessel  disease. Large right frontotemporal scalp hematoma with swelling continuing over the right preorbital area. No evidence of acute facial fracture, and no fracture seen in the cervical, thoracic and lumbar spine. Degenerative changes detailed above. Dense bones consistent with renal osteodystrophy. Regional skeletal fracture is seen elsewhere. Moderate to severe panchamber cardiomegaly with coronary artery and aortic atherosclerosis. In the abdomen with extensive aortoiliac and branch vessel after sclerosis as well. Chronic changes in the lungs.  No acute infiltrate or contusion. Moderate to large volume free ascites increased since 01/30/2022. No free hemorrhage or free air is seen. Cholelithiasis. Constipation and diverticulosis.   IMPRESSION AND PLAN: [name] has been referred for pre-anesthesia review and  clearance prior to her undergoing the planned anesthetic and procedural courses. Available labs, pertinent testing, and imaging results were personally reviewed by me in preparation for upcoming operative/procedural course. [facility] medical record has been updated following extensive record review and patient interview with PAT staff.   This patient has been appropriately cleared by cardiology with an overall LOW risk of patient experiencing significant perioperative cardiovascular complications. Based on clinical review performed today (04/17/24), barring any significant acute changes in the patient's overall condition, it is anticipated that she will be able to proceed with the planned surgical intervention. Any acute changes in clinical condition may necessitate her procedure being postponed and/or cancelled. Patient will meet with anesthesia team (MD and/or CRNA) on the day of her procedure for preoperative evaluation/assessment. Questions regarding anesthetic course will be fielded at that time.   Pre-surgical instructions were reviewed with the patient during his PAT appointment,  and questions were fielded to satisfaction by PAT clinical staff. She has been instructed on which medications that she will need to hold prior to surgery, as well as the ones that have been deemed safe/appropriate to take on the day of her procedure. As part of the general education provided by PAT, patient made aware both verbally and in writing, that she would need to abstain from the use of any illegal substances during her perioperative course. She was advised that failure to follow the provided instructions could necessitate case cancellation or result in serious perioperative complications up to and including death. Patient encouraged to contact PAT and/or her surgeon's office to discuss any questions or concerns that may arise prior to surgery; verbalized understanding.   Dorise Pereyra, MSN, APRN, FNP-C, CEN [facility]  Perioperative Services Nurse Practitioner Phone: [phone] Fax: [phone] 04/17/24 11:50 AM  NOTE: This note has been prepared using Dragon dictation software. Despite my best ability to proofread, there is always the potential that unintentional transcriptional errors may still occur from this process.

## 2024-04-18 ENCOUNTER — Ambulatory Visit: Admitting: Podiatry

## 2024-04-18 ENCOUNTER — Encounter: Payer: Self-pay | Admitting: Podiatry

## 2024-04-18 ENCOUNTER — Ambulatory Visit (INDEPENDENT_AMBULATORY_CARE_PROVIDER_SITE_OTHER)

## 2024-04-18 VITALS — Ht 66.0 in | Wt 161.0 lb

## 2024-04-18 DIAGNOSIS — M65972 Unspecified synovitis and tenosynovitis, left ankle and foot: Secondary | ICD-10-CM | POA: Diagnosis not present

## 2024-04-18 DIAGNOSIS — I70222 Atherosclerosis of native arteries of extremities with rest pain, left leg: Secondary | ICD-10-CM

## 2024-04-18 DIAGNOSIS — M2012 Hallux valgus (acquired), left foot: Secondary | ICD-10-CM

## 2024-04-18 NOTE — Progress Notes (Signed)
 Chief Complaint  Patient presents with   Foot Pain    Pt is here due to left foot pain and swelling, she states this has been going on for about a week, states over the weekend the whole foot was swollen and hard to walk on due to pain, states she seen another doctor for this issue and was told that she had gout but never had blood work done to confirm, she is also concern about the discoloration to the foot, foot is darker then the other.    Subjective: 63 y.o. female presents today for evaluation of pain and tenderness to the left lower extremity ongoing for about 1 week now.  Idiopathic onset.  No history of injury.  Past Medical History:  Diagnosis Date   Acute lacunar infarction (HCC) 03/03/2014   a.) MRI brain 03/03/2014: two acute punctate lacunar infarcts anteriorly in the BILATERAL centrum semiovale   Adult behavior problems    Anemia of chronic renal failure    Aortic atherosclerosis    Arthritis    Atrial fibrillation and flutter (HCC)    a.) CHA2DS2VASc = 6 (sex, HFrEF, HTN, CVA x 2, prior MI/vascular disease) as of 02/04/2024; b.) rate/rhythm maintained on oral metoprolol  succinate; chronically anticoagulated with apixaban    Bipolar disorder (HCC)    Cardiomegaly    Cerebral microvascular disease    CHF (congestive heart failure) (HCC)    Chicken pox    Cholelithiasis    Chronic respiratory failure with hypoxia (HCC) 12/19/2021   COPD (chronic obstructive pulmonary disease) (HCC)    Coronary artery disease    DDD (degenerative disc disease), cervical    DDD (degenerative disc disease), thoracolumbar    Diverticulosis    Dyspnea    ESRD (end stage renal disease) on dialysis (M,W,F)    GERD (gastroesophageal reflux disease)    Gout    HCV (hepatitis C virus)    Headache    HFrEF (heart failure with reduced ejection fraction) (HCC)    History of delirium 04/12/2014   History of substance abuse (HCC)    a.) tobacco + cocaine   Hyperkalemia 11/13/2014    Hyperlipidemia    Hypertension    Hypotension 02/24/2014   Hypothyroidism    Ischemic cardiomyopathy    Nose colonized with MRSA 02/01/2024   a.) presurgical PCR (+) on 02/01/2024 prior to A/V FISTULAGRAM; b.) presurgical PCR (+) on 03/07/2024 prior to AV FISTULA CREATION   NSTEMI (non-ST elevated myocardial infarction) (HCC) 10/08/2013   NSTEMI (non-ST elevated myocardial infarction) (HCC) 01/23/2002   a.) LHC 01/24/2002 --> multi-vessel CAD with IRA being 90% OM2 --> delayed PCI until 01/25/2002 --> 3.0 x 23 mm BX Velocity stent   Obesity    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    On apixaban  therapy    Peripheral vascular disease    Pneumonia    Postoperative anemia due to acute blood loss 02/27/2014   Postoperative cerebrovascular infarction following cardiac surgery 02/21/2014   a.) developed RIGHT upper/lower extremity weakness following cardiac revascularization --> neuro consult and head CT --> Hypodensity noted in the area of the LEFT motor cortex consistent with a component of acute ischemia   Pulmonary hypertension (HCC)    RBBB (right bundle branch block)    Renal osteodystrophy    S/P CABG x 3 02/20/2014   a.) LIMA-LAD, SVG-OM1, SVG-PDA   Stroke (HCC) 05/23/2010   a.) brain MRI 05/23/2010: BILATERAL acute infarcts and old prior infarcts; RIGHT frontal lobe extending into  the white matter. Small focus of restricted diffusion in the cortex of the medial LEFT frontoparietal lobe. Periventricular T2 and multiple foci of T2 hyperintensity in the subcortical white matter and increased FLAIR signal in the cortex of the LEFT frontal lobe.   Syncope and collapse 07/25/2020    Past Surgical History:  Procedure Laterality Date   A/V FISTULAGRAM Left 03/30/2019   Procedure: A/V FISTULAGRAM;  Surgeon: Marea Selinda RAMAN, MD;  Location: [facility];  Service: Cardiovascular;  Laterality: Left;   A/V FISTULAGRAM Left 10/06/2019   Procedure: A/V FISTULAGRAM;  Surgeon: Marea Selinda RAMAN, MD;  Location: [facility];  Service: Cardiovascular;  Laterality: Left;   A/V FISTULAGRAM Left 05/19/2021   Procedure: A/V FISTULAGRAM;  Surgeon: Marea Selinda RAMAN, MD;  Location: [facility];  Service: Cardiovascular;  Laterality: Left;   A/V FISTULAGRAM Left 01/29/2022   Procedure: A/V Fistulagram;  Surgeon: Marea Selinda RAMAN, MD;  Location: [facility];  Service: Cardiovascular;  Laterality: Left;   A/V FISTULAGRAM Left 04/30/2022   Procedure: A/V Fistulagram;  Surgeon: Marea Selinda RAMAN, MD;  Location: [facility];  Service: Cardiovascular;  Laterality: Left;   A/V FISTULAGRAM Left 08/20/2022   Procedure: A/V Fistulagram;  Surgeon: Marea Selinda RAMAN, MD;  Location: [facility];  Service: Cardiovascular;  Laterality: Left;   A/V FISTULAGRAM N/A 12/03/2022   Procedure: A/V Fistulagram;  Surgeon: Marea Selinda RAMAN, MD;  Location: [facility];  Service: Cardiovascular;  Laterality: N/A;   A/V FISTULAGRAM Left 02/11/2023   Procedure: A/V Fistulagram;  Surgeon: Marea Selinda RAMAN, MD;  Location: [facility];  Service: Cardiovascular;  Laterality: Left;   A/V FISTULAGRAM Left 02/07/2024   Procedure: A/V Fistulagram;  Surgeon: Marea Selinda RAMAN, MD;  Location: [facility];  Service: Cardiovascular;  Laterality: Left;   COLONOSCOPY     COLONOSCOPY WITH PROPOFOL  N/A 04/22/2021   Procedure: COLONOSCOPY WITH PROPOFOL ;  Surgeon: Maryruth Ole DASEN, MD;  Location: [facility];  Service: Endoscopy;  Laterality: N/A;   CORONARY ANGIOPLASTY WITH STENT PLACEMENT Left 01/25/2002   Procedure: CORONARY ANGIOPLASTY WITH STENT PLACEMENT; Location: [facility]; Surgeon: Margie Lovelace, MD   CORONARY ARTERY BYPASS GRAFT N/A 02/20/2014   Procedure: CORONARY ARTERY BYPASS GRAFT; Location: [facility]; Surgeon: Reyes Fruits, MD   DIALYSIS FISTULA CREATION     DIALYSIS/PERMA CATHETER INSERTION N/A 04/08/2023   Procedure: DIALYSIS/PERMA CATHETER INSERTION;  Surgeon: Marea Selinda RAMAN,  MD;  Location: [facility];  Service: Cardiovascular;  Laterality: N/A;   DIALYSIS/PERMA CATHETER REMOVAL N/A 07/06/2023   Procedure: DIALYSIS/PERMA CATHETER REMOVAL;  Surgeon: [name] Cordella MATSU, MD;  Location: [facility];  Service: Cardiovascular;  Laterality: N/A;   ESOPHAGOGASTRODUODENOSCOPY     ESOPHAGOGASTRODUODENOSCOPY N/A 04/22/2021   Procedure: ESOPHAGOGASTRODUODENOSCOPY (EGD);  Surgeon: Maryruth Ole DASEN, MD;  Location: [facility];  Service: Endoscopy;  Laterality: N/A;   FLEXIBLE SIGMOIDOSCOPY N/A 02/23/2019   Procedure: FLEXIBLE SIGMOIDOSCOPY;  Surgeon: Gaylyn Gladis PENNER, MD;  Location: [facility];  Service: Endoscopy;  Laterality: N/A;   IABP INSERTION  02/20/2014   Procedure: INTRA-AORTIC BALLOON PUMP INSERTION; Location: [facility]; Surgeon: Reyes Fruits, MD   INSERTION OF ARTERIOVENOUS (AV) ARTEGRAFT ARM Right 03/09/2024   Procedure: INSERTION, GRAFT, ARTERIOVENOUS, UPPER EXTREMITY;  Surgeon: Marea Selinda RAMAN, MD;  Location: [facility];  Service: Vascular;  Laterality: Right;  BRACHIAL AXILLARY   LEFT HEART CATH AND CORONARY ANGIOGRAPHY Left 01/24/2002   Procedure: LEFT HEART CATH AND CORONARY  ANGIOGRAPHY; Location: [facility]; Surgeon: Margie Lovelace, MD   LEFT HEART CATH AND CORONARY ANGIOGRAPHY Left 05/25/2012   Procedure: LEFT HEART CATH AND CORONARY ANGIOGRAPHY; Location: [facility]; Surgeon: Margie Lovelace, MD   LEFT HEART CATH AND CORONARY ANGIOGRAPHY Left 10/09/2013   Procedure: LEFT HEART CATH AND CORONARY ANGIOGRAPHY; Location: [facility]; Surgeon: Vinie Jude, MD   LEFT HEART CATH AND CORS/GRAFTS ANGIOGRAPHY N/A 08/08/2018   Procedure: LEFT HEART CATH AND CORS/GRAFTS ANGIOGRAPHY;  Surgeon: Fernand Denyse LABOR, MD;  Location: [facility];  Service: Cardiovascular;  Laterality: N/A;   LEFT HEART CATH AND CORS/GRAFTS ANGIOGRAPHY N/A 01/30/2019   Procedure: LEFT HEART CATH AND CORS/GRAFTS ANGIOGRAPHY;  Surgeon: Fernand Denyse LABOR, MD;  Location: [facility];   Service: Cardiovascular;  Laterality: N/A;   ultrasound guided pericardiocentesis     UPPER EXTREMITY ANGIOGRAPHY Right 03/30/2024   Procedure: Upper Extremity Angiography;  Surgeon: Marea Selinda RAMAN, MD;  Location: [facility];  Service: Cardiovascular;  Laterality: Right;    Allergies  Allergen Reactions   Morphine  And Codeine Hives   Levaquin [Levofloxacin] Itching and Other (See Comments)    Severe itching; prickly sensation; severe left leg pain; immobility.   Enalapril  Other (See Comments)    hallucinations   Latex Rash   Tape Rash and Other (See Comments)    04/18/2024  Objective: Physical Exam General: The patient is alert and oriented x3 in no acute distress.  Dermatology: Skin is cool, dry and supple bilateral lower extremities. Negative for open lesions or macerations.  Vascular: Left lower extremity is cold compared to the right lower extremity.  No capillary refill.  Pulses nonpalpable.  The upper calf is nice and warm and supple. VAS US  ABI WITH/WO TBI (Accession [id number]) (Order [id number]) Vascular Ultrasound Date: 11/12/2022 Department: [facility] Released By: Naomi, [place]  L Authorizing: Delores Orvin BRAVO, NP   ABI Findings:  +---------+------------------+-----+---------+--------+  Right   Rt Pressure (mmHg)IndexWaveform Comment   +---------+------------------+-----+---------+--------+  Brachial 100                                       +---------+------------------+-----+---------+--------+  ATA     121               1.21 triphasic          +---------+------------------+-----+---------+--------+  PTA     115               1.15 triphasic          +---------+------------------+-----+---------+--------+  Great Toe88                0.88 Abnormal           +---------+------------------+-----+---------+--------+   +---------+------------------+-----+--------+-------+  Left    Lt Pressure  (mmHg)IndexWaveformComment  +---------+------------------+-----+--------+-------+  Brachial                                AVF      +---------+------------------+-----+--------+-------+  ATA     123               1.23                  +---------+------------------+-----+--------+-------+  PTA     123               1.23                  +---------+------------------+-----+--------+-------+  Great Toe110               1.10 Normal           +---------+------------------+-----+--------+-------+   +-------+-----------+-----------+------------+------------+  ABI/TBIToday's ABIToday's TBIPrevious ABIPrevious TBI  +-------+-----------+-----------+------------+------------+  Right 1.21       .88        1.06        .96           +-------+-----------+-----------+------------+------------+  Left  1.23       1.10       1.09        .79           +-------+-----------+-----------+------------+------------+  Bilateral TBIs and ABIs appear essentially unchanged compared to prior study on 06/22/2022.    Summary:  Right: Resting right ankle-brachial index is within normal range. The right toe-brachial index is normal.  Left: Resting left ankle-brachial index is within normal range. The left toe-brachial index is normal.   Neurological: Light touch and protective threshold diminished  Musculoskeletal Exam: Severe bunion deformity LLE.  Patient ambulatory with a wheelchair  Radiographic Exam LT foot 04/18/2024: Normal osseous mineralization.  No acute fractures identified.  Increased intermetatarsal angle greater than 15 with a hallux abductus angle greater than 30 noted on AP view.   Assessment: 1.  Hallux valgus left 2.  PAD LLE; critical limb ischemia LLE   Plan of Care:  -Patient was evaluated. X-Rays reviewed. -New order placed for ABIs.  I do believe the patient's symptoms are coming from an ischemic limb -Patient is already established with [facility], Dr. Marea.  Recommend follow-up with him to have the left lower extremity evaluated -Patient will contact his office to schedule an appointment for evaluation.  She actually has a procedure with him on Thursday, 04/20/2024, for AV fistula revision -Return to clinic with me PRN   Thresa EMERSON Sar, DPM [facility]  Dr. Thresa EMERSON Sar, DPM    [address] [place], [place] [address]                Office [phone]  Fax [phone]

## 2024-04-19 MED ORDER — CHLORHEXIDINE GLUCONATE CLOTH 2 % EX PADS: 6.0000 | MEDICATED_PAD | Freq: Once | CUTANEOUS | Status: DC

## 2024-04-19 MED ORDER — CHLORHEXIDINE GLUCONATE CLOTH 2 % EX PADS
6.0000 | MEDICATED_PAD | Freq: Once | CUTANEOUS | Status: AC
  Administered 2024-04-20: 6 via TOPICAL

## 2024-04-19 MED ORDER — ORAL CARE MOUTH RINSE: 15.0000 mL | Freq: Once | OROMUCOSAL | Status: AC

## 2024-04-19 MED ORDER — CEFAZOLIN SODIUM-DEXTROSE 2-4 GM/100ML-% IV SOLN
2.0000 g | INTRAVENOUS | Status: AC
  Administered 2024-04-20: 2 g via INTRAVENOUS

## 2024-04-19 MED ORDER — LACTATED RINGERS IV SOLN: INTRAVENOUS | Status: DC

## 2024-04-19 MED ORDER — CHLORHEXIDINE GLUCONATE 0.12 % MT SOLN
15.0000 mL | Freq: Once | OROMUCOSAL | Status: AC
  Administered 2024-04-20: 15 mL via OROMUCOSAL

## 2024-04-20 ENCOUNTER — Ambulatory Visit
Admission: RE | Admit: 2024-04-20 | Discharge: 2024-04-20 | Disposition: A | Discharge status: Home / Self Care | Attending: Vascular Surgery | Admitting: Vascular Surgery

## 2024-04-20 ENCOUNTER — Encounter: Payer: Self-pay | Admitting: Vascular Surgery

## 2024-04-20 ENCOUNTER — Ambulatory Visit: Payer: Self-pay | Admitting: Urgent Care

## 2024-04-20 ENCOUNTER — Encounter
Admission: RE | Disposition: A | Discharge status: Home / Self Care | Payer: Self-pay | Source: Home / Self Care | Attending: Vascular Surgery

## 2024-04-20 ENCOUNTER — Ambulatory Visit

## 2024-04-20 ENCOUNTER — Telehealth (INDEPENDENT_AMBULATORY_CARE_PROVIDER_SITE_OTHER): Payer: Self-pay

## 2024-04-20 DIAGNOSIS — I132 Hypertensive heart and chronic kidney disease with heart failure and with stage 5 chronic kidney disease, or end stage renal disease: Secondary | ICD-10-CM | POA: Insufficient documentation

## 2024-04-20 DIAGNOSIS — Z7901 Long term (current) use of anticoagulants: Secondary | ICD-10-CM | POA: Insufficient documentation

## 2024-04-20 DIAGNOSIS — E039 Hypothyroidism, unspecified: Secondary | ICD-10-CM | POA: Diagnosis not present

## 2024-04-20 DIAGNOSIS — F17210 Nicotine dependence, cigarettes, uncomplicated: Secondary | ICD-10-CM | POA: Insufficient documentation

## 2024-04-20 DIAGNOSIS — I2510 Atherosclerotic heart disease of native coronary artery without angina pectoris: Secondary | ICD-10-CM | POA: Diagnosis not present

## 2024-04-20 DIAGNOSIS — Z9889 Other specified postprocedural states: Secondary | ICD-10-CM | POA: Diagnosis not present

## 2024-04-20 DIAGNOSIS — Y832 Surgical operation with anastomosis, bypass or graft as the cause of abnormal reaction of the patient, or of later complication, without mention of misadventure at the time of the procedure: Secondary | ICD-10-CM | POA: Diagnosis not present

## 2024-04-20 DIAGNOSIS — Z79899 Other long term (current) drug therapy: Secondary | ICD-10-CM | POA: Diagnosis not present

## 2024-04-20 DIAGNOSIS — K219 Gastro-esophageal reflux disease without esophagitis: Secondary | ICD-10-CM | POA: Diagnosis not present

## 2024-04-20 DIAGNOSIS — Z01818 Encounter for other preprocedural examination: Secondary | ICD-10-CM

## 2024-04-20 DIAGNOSIS — Z992 Dependence on renal dialysis: Secondary | ICD-10-CM | POA: Diagnosis not present

## 2024-04-20 DIAGNOSIS — I5022 Chronic systolic (congestive) heart failure: Secondary | ICD-10-CM | POA: Insufficient documentation

## 2024-04-20 DIAGNOSIS — I252 Old myocardial infarction: Secondary | ICD-10-CM | POA: Insufficient documentation

## 2024-04-20 DIAGNOSIS — Z01812 Encounter for preprocedural laboratory examination: Secondary | ICD-10-CM

## 2024-04-20 DIAGNOSIS — I739 Peripheral vascular disease, unspecified: Secondary | ICD-10-CM | POA: Insufficient documentation

## 2024-04-20 DIAGNOSIS — N186 End stage renal disease: Secondary | ICD-10-CM | POA: Insufficient documentation

## 2024-04-20 DIAGNOSIS — T82898A Other specified complication of vascular prosthetic devices, implants and grafts, initial encounter: Secondary | ICD-10-CM | POA: Diagnosis not present

## 2024-04-20 DIAGNOSIS — E785 Hyperlipidemia, unspecified: Secondary | ICD-10-CM | POA: Insufficient documentation

## 2024-04-20 DIAGNOSIS — Z951 Presence of aortocoronary bypass graft: Secondary | ICD-10-CM | POA: Diagnosis not present

## 2024-04-20 HISTORY — DX: Gout, unspecified: M10.9

## 2024-04-20 HISTORY — PX: INSERTION OF DIALYSIS CATHETER: SHX1324

## 2024-04-20 HISTORY — PX: LIGATION ARTERIOVENOUS GORTEX GRAFT: SHX5947

## 2024-04-20 LAB — POCT I-STAT, CHEM 8
BUN: 32 mg/dL — ABNORMAL HIGH (ref 8–23)
Calcium, Ion: 0.99 mmol/L — ABNORMAL LOW (ref 1.15–1.40)
Chloride: 102 mmol/L (ref 98–111)
Creatinine, Ser: 5.3 mg/dL — ABNORMAL HIGH (ref 0.44–1.00)
Glucose, Bld: 66 mg/dL — ABNORMAL LOW (ref 70–99)
HCT: 47 % — ABNORMAL HIGH (ref 36.0–46.0)
Hemoglobin: 16 g/dL — ABNORMAL HIGH (ref 12.0–15.0)
Potassium: 4.2 mmol/L (ref 3.5–5.1)
Sodium: 141 mmol/L (ref 135–145)
TCO2: 26 mmol/L (ref 22–32)

## 2024-04-20 LAB — GLUCOSE, CAPILLARY: Glucose-Capillary: 99 mg/dL (ref 70–99)

## 2024-04-20 LAB — TYPE AND SCREEN
ABO/RH(D): O POS
Antibody Screen: NEGATIVE

## 2024-04-20 SURGERY — LIGATION ARTERIOVENOUS GORTEX GRAFT
Anesthesia: General | Laterality: Right

## 2024-04-20 MED ORDER — VASOPRESSIN 20 UNIT/ML IV SOLN
INTRAVENOUS | Status: DC | PRN
  Administered 2024-04-20: 2 [IU] via INTRAVENOUS
  Administered 2024-04-20: 1 [IU] via INTRAVENOUS
  Administered 2024-04-20: 2 [IU] via INTRAVENOUS
  Administered 2024-04-20: 1 [IU] via INTRAVENOUS

## 2024-04-20 MED ORDER — PHENYLEPHRINE HCL-NACL 20-0.9 MG/250ML-% IV SOLN
INTRAVENOUS | Status: DC | PRN
  Administered 2024-04-20: 50 ug/min via INTRAVENOUS

## 2024-04-20 MED ORDER — ONDANSETRON HCL 4 MG/2ML IJ SOLN
INTRAMUSCULAR | Status: DC | PRN
  Administered 2024-04-20: 4 mg via INTRAVENOUS

## 2024-04-20 MED ORDER — FENTANYL CITRATE (PF) 100 MCG/2ML IJ SOLN
INTRAMUSCULAR | Status: DC | PRN
  Administered 2024-04-20: 50 ug via INTRAVENOUS
  Administered 2024-04-20 (×2): 25 ug via INTRAVENOUS

## 2024-04-20 MED ORDER — CEFAZOLIN SODIUM-DEXTROSE 2-4 GM/100ML-% IV SOLN
INTRAVENOUS | Status: AC
  Filled 2024-04-20: qty 100

## 2024-04-20 MED ORDER — SODIUM CHLORIDE 0.9 % IV SOLN: INTRAVENOUS | Status: DC

## 2024-04-20 MED ORDER — FENTANYL CITRATE (PF) 100 MCG/2ML IJ SOLN
INTRAMUSCULAR | Status: AC
  Filled 2024-04-20: qty 2

## 2024-04-20 MED ORDER — FENTANYL CITRATE (PF) 100 MCG/2ML IJ SOLN
25.0000 ug | INTRAMUSCULAR | Status: DC | PRN
  Administered 2024-04-20 (×4): 25 ug via INTRAVENOUS

## 2024-04-20 MED ORDER — DROPERIDOL 2.5 MG/ML IJ SOLN: 0.6250 mg | Freq: Once | INTRAMUSCULAR | Status: DC | PRN

## 2024-04-20 MED ORDER — ONDANSETRON HCL 4 MG/2ML IJ SOLN: 4.0000 mg | Freq: Four times a day (QID) | INTRAMUSCULAR | Status: DC | PRN

## 2024-04-20 MED ORDER — HYDROMORPHONE HCL 1 MG/ML IJ SOLN: 1.0000 mg | Freq: Once | INTRAMUSCULAR | Status: DC | PRN

## 2024-04-20 MED ORDER — CHLORHEXIDINE GLUCONATE 0.12 % MT SOLN
OROMUCOSAL | Status: AC
  Filled 2024-04-20: qty 15

## 2024-04-20 MED ORDER — ROCURONIUM BROMIDE 10 MG/ML (PF) SYRINGE
PREFILLED_SYRINGE | INTRAVENOUS | Status: AC
  Filled 2024-04-20: qty 10

## 2024-04-20 MED ORDER — LIDOCAINE HCL (PF) 2 % IJ SOLN
INTRAMUSCULAR | Status: AC
  Filled 2024-04-20: qty 5

## 2024-04-20 MED ORDER — HYDROMORPHONE HCL 1 MG/ML IJ SOLN
INTRAMUSCULAR | Status: AC
  Filled 2024-04-20: qty 1

## 2024-04-20 MED ORDER — PROPOFOL 10 MG/ML IV BOLUS
INTRAVENOUS | Status: AC
  Filled 2024-04-20: qty 20

## 2024-04-20 MED ORDER — SODIUM CHLORIDE 0.9 % IR SOLN
Status: DC | PRN
  Administered 2024-04-20: 500 mL

## 2024-04-20 MED ORDER — HEPARIN SODIUM (PORCINE) 5000 UNIT/ML IJ SOLN
INTRAMUSCULAR | Status: AC
  Filled 2024-04-20: qty 1

## 2024-04-20 MED ORDER — PHENYLEPHRINE HCL-NACL 20-0.9 MG/250ML-% IV SOLN
INTRAVENOUS | Status: AC
  Filled 2024-04-20: qty 250

## 2024-04-20 MED ORDER — ONDANSETRON HCL 4 MG/2ML IJ SOLN
INTRAMUSCULAR | Status: AC
  Filled 2024-04-20: qty 2

## 2024-04-20 MED ORDER — DEXAMETHASONE SOD PHOSPHATE PF 10 MG/ML IJ SOLN
INTRAMUSCULAR | Status: DC | PRN
  Administered 2024-04-20: 5 mg via INTRAVENOUS

## 2024-04-20 MED ORDER — BUPIVACAINE-EPINEPHRINE (PF) 0.25% -1:200000 IJ SOLN
INTRAMUSCULAR | Status: AC
  Filled 2024-04-20: qty 30

## 2024-04-20 MED ORDER — PROPOFOL 10 MG/ML IV BOLUS
INTRAVENOUS | Status: DC | PRN
  Administered 2024-04-20: 20 mg via INTRAVENOUS
  Administered 2024-04-20: 150 mg via INTRAVENOUS

## 2024-04-20 MED ORDER — DEXTROSE 50 % IV SOLN
INTRAVENOUS | Status: AC
  Filled 2024-04-20: qty 50

## 2024-04-20 MED ORDER — PHENYLEPHRINE 80 MCG/ML (10ML) SYRINGE FOR IV PUSH (FOR BLOOD PRESSURE SUPPORT)
PREFILLED_SYRINGE | INTRAVENOUS | Status: DC | PRN
Start: 2024-04-20 — End: 2024-04-20
  Administered 2024-04-20: 80 ug via INTRAVENOUS
  Administered 2024-04-20: 160 ug via INTRAVENOUS

## 2024-04-20 MED ORDER — DEXTROSE 50 % IV SOLN
INTRAVENOUS | Status: DC | PRN
  Administered 2024-04-20: 12.5 g via INTRAVENOUS

## 2024-04-20 MED ORDER — OXYCODONE-ACETAMINOPHEN 5-325 MG PO TABS: 1.0000 | ORAL_TABLET | Freq: Four times a day (QID) | ORAL | 0 refills | Status: DC | PRN

## 2024-04-20 MED ORDER — LIDOCAINE HCL (CARDIAC) PF 100 MG/5ML IV SOSY
PREFILLED_SYRINGE | INTRAVENOUS | Status: DC | PRN
  Administered 2024-04-20: 60 mg via INTRAVENOUS

## 2024-04-20 SURGICAL SUPPLY — 52 items
BAG DECANTER FOR FLEXI CONT (MISCELLANEOUS) ×2 IMPLANT
BIOPATCH RED 1 DISK 7.0 (GAUZE/BANDAGES/DRESSINGS) IMPLANT
BLADE SURG 15 STRL LF DISP TIS (BLADE) ×2 IMPLANT
BLADE SURG SZ11 CARB STEEL (BLADE) ×2 IMPLANT
CATH CANNON HEMO 15FR 19 (HEMODIALYSIS SUPPLIES) IMPLANT
CHLORAPREP W/TINT 26 (MISCELLANEOUS) ×2 IMPLANT
CLAMP SUTURE YELLOW 5 PAIRS (MISCELLANEOUS) ×2 IMPLANT
CLEANSER WND VASHE 4 (WOUND CARE) ×2 IMPLANT
CLIP SPRNG 6 S-JAW DBL (CLIP) IMPLANT
COVER PROBE FLX POLY STRL (MISCELLANEOUS) ×2 IMPLANT
DERMABOND ADVANCED .7 DNX12 (GAUZE/BANDAGES/DRESSINGS) ×2 IMPLANT
DRAPE C-ARM XRAY 36X54 (DRAPES) ×2 IMPLANT
DRAPE LAPAROTOMY 77X122 PED (DRAPES) ×2 IMPLANT
DRESSING SURGICEL FIBRLLR 1X2 (HEMOSTASIS) ×2 IMPLANT
DRSG TEGADERM 2X2.25 PEDS (GAUZE/BANDAGES/DRESSINGS) IMPLANT
ELECT CAUTERY BLADE 6.4 (BLADE) ×2 IMPLANT
ELECTRODE REM PT RTRN 9FT ADLT (ELECTROSURGICAL) ×2 IMPLANT
GAUZE 4X4 16PLY ~~LOC~~+RFID DBL (SPONGE) ×2 IMPLANT
GLOVE BIO SURGEON STRL SZ7 (GLOVE) ×4 IMPLANT
GOWN STRL REUS W/ TWL LRG LVL3 (GOWN DISPOSABLE) ×4 IMPLANT
GOWN STRL REUS W/TWL 2XL LVL3 (GOWN DISPOSABLE) ×2 IMPLANT
IV 0.9% NACL 500 ML (IV SOLUTION) ×2 IMPLANT
LABEL OR SOLS (LABEL) ×2 IMPLANT
LOOP VESSEL MAXI 1X406 RED (MISCELLANEOUS) ×2 IMPLANT
LOOP VESSEL MINI 0.8X406 BLUE (MISCELLANEOUS) ×2 IMPLANT
MANIFOLD NEPTUNE II (INSTRUMENTS) ×2 IMPLANT
NDL FILTER BLUNT 18X1 1/2 (NEEDLE) ×2 IMPLANT
NDL HYPO 25X1 1.5 SAFETY (NEEDLE) IMPLANT
NEEDLE FILTER BLUNT 18X1 1/2 (NEEDLE) ×2 IMPLANT
NEEDLE HYPO 25X1 1.5 SAFETY (NEEDLE) IMPLANT
NS IRRIG 500ML POUR BTL (IV SOLUTION) ×2 IMPLANT
PACK BASIN MINOR ARMC (MISCELLANEOUS) ×2 IMPLANT
PACK EXTREMITY ARMC (MISCELLANEOUS) ×2 IMPLANT
PAD PREP OB/GYN DISP 24X41 (PERSONAL CARE ITEMS) ×2 IMPLANT
PENCIL SMOKE EVACUATOR (MISCELLANEOUS) ×2 IMPLANT
SHEATH BRITE TIP 8FRX11 (SHEATH) IMPLANT
SOLUTION CELL SAVER (CLIP) IMPLANT
STOCKINETTE 48X4 2 PLY STRL (GAUZE/BANDAGES/DRESSINGS) ×2 IMPLANT
SUT MNCRL AB 4-0 PS2 18 (SUTURE) ×2 IMPLANT
SUT MNCRL+ 5-0 UNDYED PC-3 (SUTURE) ×2 IMPLANT
SUT PROLENE 6 0 BV (SUTURE) ×2 IMPLANT
SUT SILK 0 30XBRD TIE 6 (SUTURE) ×2 IMPLANT
SUT SILK 2 0 SH (SUTURE) ×2 IMPLANT
SUT SILK 2-0 18XBRD TIE 12 (SUTURE) IMPLANT
SUT SILK 3-0 18XBRD TIE 12 (SUTURE) IMPLANT
SUT SILK 4-0 18XBRD TIE 12 (SUTURE) IMPLANT
SUT VIC AB 3-0 SH 27X BRD (SUTURE) ×4 IMPLANT
SYR 10ML LL (SYRINGE) ×2 IMPLANT
SYR 20ML LL LF (SYRINGE) ×2 IMPLANT
SYR 3ML LL SCALE MARK (SYRINGE) ×2 IMPLANT
TRAP FLUID SMOKE EVACUATOR (MISCELLANEOUS) ×2 IMPLANT
WATER STERILE IRR 500ML POUR (IV SOLUTION) ×2 IMPLANT

## 2024-04-20 NOTE — Op Note (Signed)
 [facility]   OPERATIVE NOTE  DATE: 04/20/2024  PRE-OPERATIVE DIAGNOSIS: Steal syndrome right arm after right brachial artery to axillary vein AV graft placement  POST-OPERATIVE DIAGNOSIS: same as above  PROCEDURE: 1.   Ligation of right brachial artery to axillary vein AV graft  SURGEON: Selinda Gu, MD  ASSISTANT(S): None  ANESTHESIA: General  ESTIMATED BLOOD LOSS: minimal  FINDING(S): 1.  none  SPECIMEN(S):  None  INDICATIONS:   Patient is a 63 y.o.female who presents with significant steal syndrome to the right hand after previous right brachial artery to axillary vein AV graft placement.  Attempts at percutaneous therapy were performed but her symptoms continued to progress.  Her graft needs to be ligated.  Risks and benefits were discussed and the patient was agreeable to proceed.  DESCRIPTION: After obtaining full informed written consent, the patient was brought back to the operating room and placed supine upon the operating table.  The patient received IV antibiotics prior to induction.  After obtaining adequate anesthesia, the patient was prepped and draped in the standard fashion. I created a small transverse incision in the mid to distal upper arm overlying the AVG.  The graft was dissected out.  In that area, I doubly ligated the AV graft with 0 silk sutures.  This included the AV graft.  The wound was then closed with a 3-0 Vicryl and a 4-0 Monocryl. Dermabond was placed as a dressing. The patient was taken to the recovery room in stable condition having tolerated the procedure well.  COMPLICATIONS: None  CONDITION: Stable   Selinda Gu 04/20/2024 9:21 AM  This note was created with Dragon Medical transcription system. Any errors in dictation are purely unintentional.

## 2024-04-20 NOTE — Interval H&P Note (Signed)
 History and Physical Interval Note:  04/20/2024 7:15 AM  [name]  has presented today for surgery, with the diagnosis of ESRD.  The various methods of treatment have been discussed with the patient and family. After consideration of risks, benefits and other options for treatment, the patient has consented to  Procedure(s): LIGATION ARTERIOVENOUS GORTEX GRAFT (Right) INSERTION OF DIALYSIS CATHETER (N/A) as a surgical intervention.  The patient's history has been reviewed, patient examined, no change in status, stable for surgery.  I have reviewed the patient's chart and labs.  Questions were answered to the patient's satisfaction.     Jessah Danser

## 2024-04-20 NOTE — Op Note (Signed)
 OPERATIVE NOTE    PRE-OPERATIVE DIAGNOSIS: 1. ESRD 2.  Failing left arm AV access and steal syndrome after placement of right arm AV graft requiring ligation  POST-OPERATIVE DIAGNOSIS: same as above  PROCEDURE: Ultrasound guidance for vascular access to the right internal jugular vein Fluoroscopic guidance for placement of catheter Placement of a 19 cm tip to cuff tunneled hemodialysis catheter via the right internal jugular vein  SURGEON: Selinda Gu, MD  ANESTHESIA:  general  ESTIMATED BLOOD LOSS: 25 cc  CONTRAST: none  FINDING(S): 1.  Patent right internal jugular vein  SPECIMEN(S):  None  INDICATIONS:   [name] is a 63 y.o.female who presents with steal syndrome of the right arm and we are ligating her graft.  Her left arm access is failing and she will need a PermCath.  The patient needs long term dialysis access for their ESRD, and a Permcath is necessary.  Risks and benefits are discussed and informed consent is obtained.    DESCRIPTION: After obtaining full informed written consent, the patient was brought back to the vascular suited. The patient's right neck and chest were sterilely prepped and draped in a sterile surgical field was created. Moderate conscious sedation was administered during a face to face encounter with the patient throughout the procedure with my supervision of the RN administering medicines and monitoring the patient's vital signs, pulse oximetry, telemetry and mental status throughout from the start of the procedure until the patient was taken to the recovery room.  The right internal jugular vein was visualized with ultrasound and found to be patent. It was then accessed under direct ultrasound guidance and a permanent image was recorded. A wire was placed. After skin nick and dilatation, the peel-away sheath was placed over the wire. I then turned my attention to an area under the clavicle. Approximately 1-2 fingerbreadths below the clavicle a  small counterincision was created and tunneled from the subclavicular incision to the access site. Using fluoroscopic guidance, a 19 centimeter tip to cuff tunneled hemodialysis catheter was selected, and tunneled from the subclavicular incision to the access site. It was then placed through the peel-away sheath and the peel-away sheath was removed. Using fluoroscopic guidance the catheter tips were parked in the right atrium. The appropriate distal connectors were placed. It withdrew blood well and flushed easily with heparinized saline and a concentrated heparin  solution was then placed. It was secured to the chest wall with 2 Prolene sutures. The access incision was closed single 4-0 Monocryl. A 4-0 Monocryl pursestring suture was placed around the exit site. Sterile dressings were placed. The patient tolerated the procedure well and then turned our attention to the ligation of the graft portion of the procedure.  COMPLICATIONS: None  CONDITION: Stable  Selinda Gu, MD 04/20/2024 9:23 AM   This note was created with Dragon Medical transcription system. Any errors in dictation are purely unintentional.

## 2024-04-20 NOTE — Anesthesia Procedure Notes (Signed)
 Procedure Name: LMA Insertion Date/Time: 04/20/2024 7:46 AM  Performed by: Lorrene Camelia LABOR, CRNAPre-anesthesia Checklist: Patient identified, Patient being monitored, Emergency Drugs available and Suction available Patient Re-evaluated:Patient Re-evaluated prior to induction Oxygen  Delivery Method: Circle system utilized Preoxygenation: Pre-oxygenation with 100% oxygen  Induction Type: IV induction Ventilation: Mask ventilation without difficulty LMA: LMA inserted LMA Size: 4.0 Tube type: Oral Number of attempts: 1 Placement Confirmation: positive ETCO2 and breath sounds checked- equal and bilateral Tube secured with: Tape Dental Injury: Teeth and Oropharynx as per pre-operative assessment

## 2024-04-20 NOTE — Anesthesia Preprocedure Evaluation (Signed)
 Anesthesia Evaluation  Patient identified by MRN, date of birth, ID band Patient awake    Reviewed: Allergy & Precautions, NPO status , Patient's Chart, lab work & pertinent test results  History of Anesthesia Complications Negative for: history of anesthetic complications  Airway Mallampati: III  TM Distance: <3 FB Neck ROM: full    Dental  (+) Chipped, Poor Dentition, Missing, Dental Advidsory Given   Pulmonary shortness of breath and with exertion, neg sleep apnea, COPD, neg recent URI, Current Smoker and Patient abstained from smoking.   Pulmonary exam normal        Cardiovascular hypertension, (-) angina + CAD, + Past MI, + CABG, + Peripheral Vascular Disease and +CHF  (-) Cardiac Stents Normal cardiovascular exam+ dysrhythmias (-) Valvular Problems/Murmurs  ECHO 05/17/23: 1. Left ventricular ejection fraction, by estimation, is 25 to 30%. The left ventricle has severely decreased function. The left ventricle demonstrates global hypokinesis. The left ventricular internal cavity size was severely dilated. There is moderate  left ventricular hypertrophy. Left ventricular diastolic parameters are consistent with Grade II diastolic dysfunction (pseudonormalization).   2. Right ventricular systolic function is severely reduced. The right ventricular size is severely enlarged. Mildly increased right ventricular wall thickness. There is severely elevated pulmonary artery systolic pressure.   3. Left atrial size was severely dilated.   4. Right atrial size was mildly dilated.   5. The mitral valve is grossly normal. Mild to moderate mitral valve regurgitation.   6. Tricuspid valve regurgitation is moderate.   7. The aortic valve is calcified. Aortic valve regurgitation is not visualized.     Neuro/Psych  Headaches PSYCHIATRIC DISORDERS      CVA, Residual Symptoms    GI/Hepatic ,GERD  Controlled,,(+) Hepatitis -  Endo/Other   Hypothyroidism    Renal/GU Renal disease  negative genitourinary   Musculoskeletal   Abdominal   Peds  Hematology negative hematology ROS (+)   Anesthesia Other Findings Past Medical History: 03/03/2014: Acute lacunar infarction [facility])     Comment:  a.) MRI brain 03/03/2014: two acute punctate lacunar               infarcts anteriorly in the BILATERAL centrum semiovale No date: Adult behavior problems No date: Anemia of chronic renal failure No date: Aortic atherosclerosis (HCC) No date: Atrial fibrillation and flutter (HCC)     Comment:  a.) CHA2DS2VASc = 6 (sex, HFrEF, HTN, CVA x 2, prior               MI/vascular disease) as of 02/04/2024; b.) rate/rhythm               maintained on oral metoprolol  succinate; chronically               anticoagulated with apixaban  No date: Bipolar disorder (HCC) No date: Cardiomegaly No date: Cerebral microvascular disease No date: Chicken pox No date: Cholelithiasis 12/19/2021: Chronic respiratory failure with hypoxia (HCC) No date: COPD (chronic obstructive pulmonary disease) (HCC) No date: Coronary artery disease No date: DDD (degenerative disc disease), cervical No date: DDD (degenerative disc disease), thoracolumbar No date: Diverticulosis No date: Dyspnea No date: ESRD (end stage renal disease) on dialysis (M,W,F) No date: GERD (gastroesophageal reflux disease) No date: HCV (hepatitis C virus) No date: HFrEF (heart failure with reduced ejection fraction) (HCC) 04/12/2014: History of delirium No date: History of substance abuse (HCC)     Comment:  a.) tobacco + cocaine 11/13/2014: Hyperkalemia No date: Hyperlipidemia No date: Hypertension 02/24/2014: Hypotension No date: Hypothyroidism  No date: Ischemic cardiomyopathy 02/01/2024: Nose colonized with MRSA     Comment:  a.) presurgical PCR (+) on 02/01/2024 prior to A/V               FISTULAGRAM 10/08/2013: NSTEMI (non-ST elevated myocardial infarction) (HCC) 01/23/2002:  NSTEMI (non-ST elevated myocardial infarction) [facility])     Comment:  a.) LHC 01/24/2002 --> multi-vessel CAD with IRA being               90% OM2 --> delayed PCI until 01/25/2002 --> 3.0 x 23 mm               BX Velocity stent No date: Obesity No date: 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 No date: On apixaban  therapy No date: Peripheral vascular disease (HCC) 02/27/2014: Postoperative anemia due to acute blood loss 02/21/2014: Postoperative cerebrovascular infarction following  cardiac surgery (HCC)     Comment:  a.) developed RIGHT upper/lower extremity weakness               following cardiac revascularization --> neuro consult and              head CT --> Hypodensity noted in the area of the LEFT               motor cortex consistent with a component of acute               ischemia No date: Pulmonary hypertension (HCC) No date: RBBB (right bundle branch block) No date: Renal osteodystrophy 02/20/2014: S/P CABG x 3     Comment:  a.) LIMA-LAD, SVG-OM1, SVG-PDA 05/23/2010: Stroke (HCC)     Comment:  a.) brain MRI 05/23/2010: BILATERAL acute infarcts and               old prior infarcts; RIGHT frontal lobe extending into the              white matter. Small focus of restricted diffusion in the               cortex of the medial LEFT frontoparietal lobe.               Periventricular T2 and multiple foci of T2 hyperintensity              in the subcortical white matter and increased FLAIR               signal in the cortex of the LEFT frontal lobe. 07/25/2020: Syncope and collapse  Past Surgical History: 03/30/2019: A/V FISTULAGRAM; Left     Comment:  Procedure: A/V FISTULAGRAM;  Surgeon: Marea Selinda RAMAN, MD;               Location: [facility];  Service: Cardiovascular;              Laterality: Left; 10/06/2019: A/V FISTULAGRAM; Left     Comment:  Procedure: A/V FISTULAGRAM;  Surgeon: Marea Selinda RAMAN, MD;               Location: [facility];  Service:  Cardiovascular;              Laterality: Left; 05/19/2021: A/V FISTULAGRAM; Left     Comment:  Procedure: A/V FISTULAGRAM;  Surgeon: Marea Selinda RAMAN, MD;               Location: [facility];  Service: Cardiovascular;  Laterality: Left; 01/29/2022: A/V FISTULAGRAM; Left     Comment:  Procedure: A/V Fistulagram;  Surgeon: Marea Selinda RAMAN, MD;               Location: [facility];  Service: Cardiovascular;              Laterality: Left; 04/30/2022: A/V FISTULAGRAM; Left     Comment:  Procedure: A/V Fistulagram;  Surgeon: Marea Selinda RAMAN, MD;               Location: [facility];  Service: Cardiovascular;              Laterality: Left; 08/20/2022: A/V FISTULAGRAM; Left     Comment:  Procedure: A/V Fistulagram;  Surgeon: Marea Selinda RAMAN, MD;               Location: [facility];  Service: Cardiovascular;              Laterality: Left; 12/03/2022: A/V FISTULAGRAM; N/A     Comment:  Procedure: A/V Fistulagram;  Surgeon: Marea Selinda RAMAN, MD;               Location: [facility];  Service: Cardiovascular;              Laterality: N/A; 02/11/2023: A/V FISTULAGRAM; Left     Comment:  Procedure: A/V Fistulagram;  Surgeon: Marea Selinda RAMAN, MD;               Location: [facility];  Service: Cardiovascular;              Laterality: Left; No date: COLONOSCOPY 04/22/2021: COLONOSCOPY WITH PROPOFOL ; N/A     Comment:  Procedure: COLONOSCOPY WITH PROPOFOL ;  Surgeon:               Maryruth Ole DASEN, MD;  Location: [facility];                Service: Endoscopy;  Laterality: N/A; 01/25/2002: CORONARY ANGIOPLASTY WITH STENT PLACEMENT; Left     Comment:  Procedure: CORONARY ANGIOPLASTY WITH STENT PLACEMENT;               Location: [facility]; Surgeon: Margie Lovelace, MD 02/20/2014: CORONARY ARTERY BYPASS GRAFT; N/A     Comment:  Procedure: CORONARY ARTERY BYPASS GRAFT; Location: [facility];              Surgeon: Reyes Fruits, MD No date: DIALYSIS FISTULA  CREATION 04/08/2023: DIALYSIS/PERMA CATHETER INSERTION; N/A     Comment:  Procedure: DIALYSIS/PERMA CATHETER INSERTION;  Surgeon:               Marea Selinda RAMAN, MD;  Location: [facility];                Service: Cardiovascular;  Laterality: N/A; 07/06/2023: DIALYSIS/PERMA CATHETER REMOVAL; N/A     Comment:  Procedure: DIALYSIS/PERMA CATHETER REMOVAL;  Surgeon:               [name] Cordella MATSU, MD;  Location: [facility];               Service: Cardiovascular;  Laterality: N/A; No date: ESOPHAGOGASTRODUODENOSCOPY 04/22/2021: ESOPHAGOGASTRODUODENOSCOPY; N/A     Comment:  Procedure: ESOPHAGOGASTRODUODENOSCOPY (EGD);  Surgeon:               Maryruth Ole DASEN, MD;  Location: [facility];  Service: Endoscopy;  Laterality: N/A; 02/23/2019: FLEXIBLE SIGMOIDOSCOPY; N/A     Comment:  Procedure: FLEXIBLE SIGMOIDOSCOPY;  Surgeon: Gaylyn Gladis PENNER, MD;  Location: [facility];  Service:               Endoscopy;  Laterality: N/A; 02/20/2014: IABP INSERTION     Comment:  Procedure: INTRA-AORTIC BALLOON PUMP INSERTION;               Location: [facility]; Surgeon: Reyes Fruits, MD 01/24/2002: LEFT HEART CATH AND CORONARY ANGIOGRAPHY; Left     Comment:  Procedure: LEFT HEART CATH AND CORONARY ANGIOGRAPHY;               Location: [facility]; Surgeon: Margie Lovelace, MD 05/25/2012: LEFT HEART CATH AND CORONARY ANGIOGRAPHY; Left     Comment:  Procedure: LEFT HEART CATH AND CORONARY ANGIOGRAPHY;               Location: [facility]; Surgeon: Margie Lovelace, MD 10/09/2013: LEFT HEART CATH AND CORONARY ANGIOGRAPHY; Left     Comment:  Procedure: LEFT HEART CATH AND CORONARY ANGIOGRAPHY;               Location: [facility]; Surgeon: Vinie Jude, MD 08/08/2018: LEFT HEART CATH AND CORS/GRAFTS ANGIOGRAPHY; N/A     Comment:  Procedure: LEFT HEART CATH AND CORS/GRAFTS ANGIOGRAPHY;               Surgeon: Fernand Denyse LABOR, MD;  Location: [facility];  Service:  Cardiovascular;  Laterality: N/A; 01/30/2019: LEFT HEART CATH AND CORS/GRAFTS ANGIOGRAPHY; N/A     Comment:  Procedure: LEFT HEART CATH AND CORS/GRAFTS ANGIOGRAPHY;               Surgeon: Fernand Denyse LABOR, MD;  Location: [facility]              [facility];  Service: Cardiovascular;  Laterality: N/A; No date: ultrasound guided pericardiocentesis     Reproductive/Obstetrics negative OB ROS                              Anesthesia Physical Anesthesia Plan  ASA: 4  Anesthesia Plan: General   Post-op Pain Management:    Induction: Intravenous  PONV Risk Score and Plan: 3 and Ondansetron , Dexamethasone  and Treatment may vary due to age or medical condition  Airway Management Planned: LMA  Additional Equipment:   Intra-op Plan:   Post-operative Plan: Extubation in OR  Informed Consent: I have reviewed the patients History and Physical, chart, labs and discussed the procedure including the risks, benefits and alternatives for the proposed anesthesia with the patient or authorized representative who has indicated his/her understanding and acceptance.     Dental Advisory Given  Plan Discussed with: Anesthesiologist, CRNA and Surgeon  Anesthesia Plan Comments: (Patient consented for risks of anesthesia including but not limited to:  - adverse reactions to medications - risk of airway placement if required - damage to eyes, teeth, lips or other oral mucosa - nerve damage due to positioning  - sore throat or hoarseness - Damage to heart, brain, nerves, lungs, other parts of body or loss of life  Patient voiced understanding and assent.)         Anesthesia Quick Evaluation

## 2024-04-20 NOTE — Telephone Encounter (Signed)
 Patient has been schedule for a left leg angio w/ anesthesia on 04/24/24 with Dr. Marea at the [facility] with a 1:30 pm arrival time.

## 2024-04-20 NOTE — Transfer of Care (Signed)
 Immediate Anesthesia Transfer of Care Note  Patient: [name]  Procedure(s) Performed: LIGATION ARTERIOVENOUS GORTEX GRAFT (Right) INSERTION OF DIALYSIS CATHETER  Patient Location: PACU  Anesthesia Type:General  Level of Consciousness: drowsy and patient cooperative  Airway & Oxygen  Therapy: Patient Spontanous Breathing and Patient connected to nasal cannula oxygen   Post-op Assessment: Report given to RN and Post -op Vital signs reviewed and stable  Post vital signs: Reviewed and stable  Last Vitals:  Vitals Value Taken Time  BP 92/53 04/20/24 09:37  Temp 37 C 04/20/24 09:37  Pulse 100 04/20/24 09:37  Resp 16 04/20/24 09:37  SpO2 42 % 04/20/24 09:37  Vitals shown include unfiled device data.  Last Pain:  Vitals:   04/20/24 0709  TempSrc: Temporal  PainSc: 10-Worst pain ever      Patients Stated Pain Goal: 3 (04/20/24 0709)  Complications: No notable events documented.

## 2024-04-21 ENCOUNTER — Encounter: Payer: Self-pay | Admitting: Vascular Surgery

## 2024-04-24 ENCOUNTER — Encounter: Payer: Self-pay | Admitting: Vascular Surgery

## 2024-04-24 ENCOUNTER — Other Ambulatory Visit: Payer: Self-pay

## 2024-04-24 ENCOUNTER — Encounter
Admission: RE | Disposition: A | Discharge status: Home / Self Care | Payer: Self-pay | Source: Home / Self Care | Attending: Vascular Surgery

## 2024-04-24 ENCOUNTER — Ambulatory Visit: Admitting: General Practice

## 2024-04-24 ENCOUNTER — Ambulatory Visit
Admission: RE | Admit: 2024-04-24 | Discharge: 2024-04-24 | Disposition: A | Discharge status: Home / Self Care | Attending: Vascular Surgery | Admitting: Vascular Surgery

## 2024-04-24 DIAGNOSIS — Z992 Dependence on renal dialysis: Secondary | ICD-10-CM | POA: Insufficient documentation

## 2024-04-24 DIAGNOSIS — I708 Atherosclerosis of other arteries: Secondary | ICD-10-CM

## 2024-04-24 DIAGNOSIS — I5022 Chronic systolic (congestive) heart failure: Secondary | ICD-10-CM | POA: Insufficient documentation

## 2024-04-24 DIAGNOSIS — Z79899 Other long term (current) drug therapy: Secondary | ICD-10-CM | POA: Insufficient documentation

## 2024-04-24 DIAGNOSIS — R29898 Other symptoms and signs involving the musculoskeletal system: Secondary | ICD-10-CM | POA: Diagnosis not present

## 2024-04-24 DIAGNOSIS — N186 End stage renal disease: Secondary | ICD-10-CM

## 2024-04-24 DIAGNOSIS — K219 Gastro-esophageal reflux disease without esophagitis: Secondary | ICD-10-CM | POA: Insufficient documentation

## 2024-04-24 DIAGNOSIS — I70292 Other atherosclerosis of native arteries of extremities, left leg: Secondary | ICD-10-CM

## 2024-04-24 DIAGNOSIS — I132 Hypertensive heart and chronic kidney disease with heart failure and with stage 5 chronic kidney disease, or end stage renal disease: Secondary | ICD-10-CM | POA: Insufficient documentation

## 2024-04-24 DIAGNOSIS — E785 Hyperlipidemia, unspecified: Secondary | ICD-10-CM | POA: Insufficient documentation

## 2024-04-24 DIAGNOSIS — I70229 Atherosclerosis of native arteries of extremities with rest pain, unspecified extremity: Secondary | ICD-10-CM | POA: Diagnosis present

## 2024-04-24 DIAGNOSIS — I2510 Atherosclerotic heart disease of native coronary artery without angina pectoris: Secondary | ICD-10-CM | POA: Diagnosis not present

## 2024-04-24 DIAGNOSIS — I252 Old myocardial infarction: Secondary | ICD-10-CM | POA: Insufficient documentation

## 2024-04-24 DIAGNOSIS — I70222 Atherosclerosis of native arteries of extremities with rest pain, left leg: Secondary | ICD-10-CM | POA: Diagnosis not present

## 2024-04-24 DIAGNOSIS — Z8673 Personal history of transient ischemic attack (TIA), and cerebral infarction without residual deficits: Secondary | ICD-10-CM | POA: Diagnosis not present

## 2024-04-24 DIAGNOSIS — F17210 Nicotine dependence, cigarettes, uncomplicated: Secondary | ICD-10-CM | POA: Insufficient documentation

## 2024-04-24 DIAGNOSIS — F319 Bipolar disorder, unspecified: Secondary | ICD-10-CM | POA: Diagnosis not present

## 2024-04-24 DIAGNOSIS — J449 Chronic obstructive pulmonary disease, unspecified: Secondary | ICD-10-CM | POA: Diagnosis not present

## 2024-04-24 HISTORY — PX: LOWER EXTREMITY ANGIOGRAPHY: CATH118251

## 2024-04-24 LAB — POTASSIUM (ARMC VASCULAR LAB ONLY): Potassium (ARMC vascular lab): 3.5 mmol/L (ref 3.5–5.1)

## 2024-04-24 SURGERY — LOWER EXTREMITY ANGIOGRAPHY
Anesthesia: General | Site: Leg Lower | Laterality: Left

## 2024-04-24 MED ORDER — METHYLPREDNISOLONE SODIUM SUCC 125 MG IJ SOLR: 125.0000 mg | Freq: Once | INTRAMUSCULAR | Status: DC | PRN

## 2024-04-24 MED ORDER — VASOPRESSIN 20 UNIT/ML IV SOLN
INTRAVENOUS | Status: DC | PRN
  Administered 2024-04-24: 1 [IU] via INTRAVENOUS
  Administered 2024-04-24 (×2): 2 [IU] via INTRAVENOUS
  Administered 2024-04-24: 1 [IU] via INTRAVENOUS
  Administered 2024-04-24: 2 [IU] via INTRAVENOUS

## 2024-04-24 MED ORDER — PROPOFOL 1000 MG/100ML IV EMUL
INTRAVENOUS | Status: AC
Start: 2024-04-24 — End: 2024-04-24
  Filled 2024-04-24: qty 100

## 2024-04-24 MED ORDER — OXYCODONE HCL 5 MG PO TABS: 5.0000 mg | ORAL_TABLET | Freq: Once | ORAL | Status: DC | PRN

## 2024-04-24 MED ORDER — FENTANYL CITRATE (PF) 100 MCG/2ML IJ SOLN
INTRAMUSCULAR | Status: DC | PRN
  Administered 2024-04-24 (×4): 25 ug via INTRAVENOUS

## 2024-04-24 MED ORDER — OXYCODONE HCL 5 MG/5ML PO SOLN
5.0000 mg | Freq: Once | ORAL | Status: DC | PRN
  Filled 2024-04-24: qty 5

## 2024-04-24 MED ORDER — IODIXANOL 320 MG/ML IV SOLN
INTRAVENOUS | Status: DC | PRN
  Administered 2024-04-24: 40 mL via INTRA_ARTERIAL

## 2024-04-24 MED ORDER — HYDROMORPHONE HCL 1 MG/ML IJ SOLN: 1.0000 mg | Freq: Once | INTRAMUSCULAR | Status: DC | PRN

## 2024-04-24 MED ORDER — FENTANYL CITRATE (PF) 100 MCG/2ML IJ SOLN
INTRAMUSCULAR | Status: AC
  Filled 2024-04-24: qty 2

## 2024-04-24 MED ORDER — DIPHENHYDRAMINE HCL 50 MG/ML IJ SOLN: 50.0000 mg | Freq: Once | INTRAMUSCULAR | Status: DC | PRN

## 2024-04-24 MED ORDER — LIDOCAINE-EPINEPHRINE (PF) 1 %-1:200000 IJ SOLN
INTRAMUSCULAR | Status: DC | PRN
  Administered 2024-04-24 (×2): 10 mL

## 2024-04-24 MED ORDER — CEFAZOLIN SODIUM-DEXTROSE 1-4 GM/50ML-% IV SOLN
INTRAVENOUS | Status: AC
  Filled 2024-04-24: qty 50

## 2024-04-24 MED ORDER — MIDAZOLAM HCL 2 MG/ML PO SYRP: 8.0000 mg | ORAL_SOLUTION | Freq: Once | ORAL | Status: DC | PRN

## 2024-04-24 MED ORDER — PHENYLEPHRINE 80 MCG/ML (10ML) SYRINGE FOR IV PUSH (FOR BLOOD PRESSURE SUPPORT)
PREFILLED_SYRINGE | INTRAVENOUS | Status: AC
  Filled 2024-04-24: qty 10

## 2024-04-24 MED ORDER — FAMOTIDINE 20 MG PO TABS: 40.0000 mg | ORAL_TABLET | Freq: Once | ORAL | Status: DC | PRN

## 2024-04-24 MED ORDER — PROPOFOL 500 MG/50ML IV EMUL
INTRAVENOUS | Status: DC | PRN
  Administered 2024-04-24: 100 ug/kg/min via INTRAVENOUS

## 2024-04-24 MED ORDER — CEFAZOLIN SODIUM-DEXTROSE 1-4 GM/50ML-% IV SOLN
1.0000 g | INTRAVENOUS | Status: AC
  Administered 2024-04-24: 1 g via INTRAVENOUS

## 2024-04-24 MED ORDER — LIDOCAINE HCL (CARDIAC) PF 100 MG/5ML IV SOSY
PREFILLED_SYRINGE | INTRAVENOUS | Status: DC | PRN
  Administered 2024-04-24: 40 mg via INTRAVENOUS

## 2024-04-24 MED ORDER — FENTANYL CITRATE (PF) 100 MCG/2ML IJ SOLN: 25.0000 ug | INTRAMUSCULAR | Status: DC | PRN

## 2024-04-24 MED ORDER — ONDANSETRON HCL 4 MG/2ML IJ SOLN: 4.0000 mg | Freq: Four times a day (QID) | INTRAMUSCULAR | Status: DC | PRN

## 2024-04-24 MED ORDER — SODIUM CHLORIDE 0.9 % IV SOLN: INTRAVENOUS | Status: DC

## 2024-04-24 MED ORDER — MIDAZOLAM HCL 2 MG/2ML IJ SOLN
INTRAMUSCULAR | Status: AC
  Filled 2024-04-24: qty 2

## 2024-04-24 MED ORDER — PHENYLEPHRINE 80 MCG/ML (10ML) SYRINGE FOR IV PUSH (FOR BLOOD PRESSURE SUPPORT)
PREFILLED_SYRINGE | INTRAVENOUS | Status: DC | PRN
  Administered 2024-04-24 (×2): 80 ug via INTRAVENOUS
  Administered 2024-04-24: 160 ug via INTRAVENOUS
  Administered 2024-04-24 (×5): 80 ug via INTRAVENOUS
  Administered 2024-04-24: 160 ug via INTRAVENOUS

## 2024-04-24 MED ORDER — HEPARIN (PORCINE) IN NACL 2000-0.9 UNIT/L-% IV SOLN
INTRAVENOUS | Status: DC | PRN
  Administered 2024-04-24 (×2): 1000 mL

## 2024-04-24 MED ORDER — MIDAZOLAM HCL (PF) 2 MG/2ML IJ SOLN
INTRAMUSCULAR | Status: DC | PRN
  Administered 2024-04-24 (×2): 1 mg via INTRAVENOUS

## 2024-04-24 SURGICAL SUPPLY — 9 items
CATH ANGIO 5F PIGTAIL 65CM (CATHETERS) IMPLANT
COVER PROBE ULTRASOUND 5X96 (MISCELLANEOUS) IMPLANT
DEVICE STARCLOSE SE CLOSURE (Vascular Products) IMPLANT
GLIDEWIRE ADV .035X260CM (WIRE) IMPLANT
PACK ANGIOGRAPHY (CUSTOM PROCEDURE TRAY) ×1 IMPLANT
SHEATH BRITE TIP 5FRX11 (SHEATH) IMPLANT
SYR MEDRAD MARK 7 150ML (SYRINGE) IMPLANT
TUBING CONTRAST HIGH PRESS 72 (TUBING) IMPLANT
WIRE J 3MM .035X145CM (WIRE) IMPLANT

## 2024-04-24 NOTE — Op Note (Signed)
 [facility]  Percutaneous Study/Intervention Procedural Note   Date of Surgery: 04/24/2024  Surgeon(s):Jacee Enerson    Assistants:none  Pre-operative Diagnosis: PAD with suspected rest pain left lower extremity, ESRD, coronary disease  Post-operative diagnosis:  Same  Procedure(s) Performed:             1.  Ultrasound guidance for vascular access right femoral artery             2.  Catheter placement into left SFA from right femoral approach             3.  Aortogram and selective left lower extremity angiogram             4.  StarClose closure device right femoral artery  EBL: 3 cc  Contrast: 40 cc  Fluoro Time: 1.5 minutes  Anesthesia: MAC              Indications:  Patient is a 63 y.o.female with symptoms worrisome for ischemic rest pain of the left foot.  This was discovered when she presented to her podiatrist office initially and then to the hospital for her arm intervention for steal syndrome.  She has longstanding end-stage renal disease and a litany of other medical issues.  The patient is brought in for angiography for further evaluation and potential treatment.  Due to the limb threatening nature of the situation, angiogram was performed for attempted limb salvage. The patient is aware that if the procedure fails, amputation would be expected.  The patient also understands that even with successful revascularization, amputation may still be required due to the severity of the situation. Risks and benefits are discussed and informed consent is obtained.   Procedure:  The patient was identified and appropriate procedural time out was performed.  The patient was then placed supine on the table and prepped and draped in the usual sterile fashion. Moderate conscious sedation was administered during a face to face encounter with the patient throughout the procedure with my supervision of the RN administering medicines and monitoring the patient's vital signs,  pulse oximetry, telemetry and mental status throughout from the start of the procedure until the patient was taken to the recovery room. Ultrasound was used to evaluate the right common femoral artery.  It was patent .  A digital ultrasound image was acquired.  A Seldinger needle was used to access the right common femoral artery under direct ultrasound guidance and a permanent image was performed.  A 0.035 J wire was advanced without resistance and a 5Fr sheath was placed.  Pigtail catheter was placed into the aorta and an AP aortogram was performed. This demonstrated minimal flow in the renal arteries and normal aorta and iliac segments without significant stenosis although they were highly calcific. I then crossed the aortic bifurcation and advanced to the left femoral head and then into the proximal left SFA to opacify distally. Selective left lower extremity angiogram was then performed. This demonstrated calcific changes throughout the lower extremity.  Despite the calcific changes, the vessels actually were patent with no significant disease within the common femoral artery, profunda femoris artery, superficial femoral or popliteal arteries.  There was a typical tibial trifurcation and then three-vessel runoff without focal stenosis in the tibial vessels with brisk filling of both the anterior tibial and posterior tibial arteries into the foot.  Her flow was surprisingly good and no revascularization was required.  I elected to terminate the procedure. The sheath was removed and StarClose closure device  was deployed in the right femoral artery with excellent hemostatic result. The patient was taken to the recovery room in stable condition having tolerated the procedure well.  Findings:               Aortogram:  This demonstrated minimal flow in the renal arteries and normal aorta and iliac segments without significant stenosis although they were highly calcific.             Left lower Extremity:  This  demonstrated calcific changes throughout the lower extremity.  Despite the calcific changes, the vessels actually were patent with no significant disease within the common femoral artery, profunda femoris artery, superficial femoral or popliteal arteries.  There was a typical tibial trifurcation and then three-vessel runoff without focal stenosis in the tibial vessels with brisk filling of both the anterior tibial and posterior tibial arteries into the foot   Disposition: Patient was taken to the recovery room in stable condition having tolerated the procedure well.  Complications: None  Selinda Gu 04/24/2024 3:29 PM   This note was created with Dragon Medical transcription system. Any errors in dictation are purely unintentional.

## 2024-04-24 NOTE — Transfer of Care (Signed)
 Immediate Anesthesia Transfer of Care Note  Patient: [name]  Procedure(s) Performed: Lower Extremity Angiography (Left: Leg Lower)  Patient Location: PACU  Anesthesia Type:General  Level of Consciousness: drowsy  Airway & Oxygen  Therapy: Patient Spontanous Breathing and Patient connected to face mask oxygen   Post-op Assessment: Report given to RN and Post -op Vital signs reviewed and stable  Post vital signs: Reviewed and stable  Last Vitals:  Vitals Value Taken Time  BP 101/70 04/24/24 15:52  Temp 36.1 C 04/24/24 15:52  Pulse 102 04/24/24 15:57  Resp 14 04/24/24 15:57  SpO2 100 % 04/24/24 15:57  Vitals shown include unfiled device data.  Last Pain:  Vitals:   04/24/24 1552  TempSrc:   PainSc: Asleep         Complications: There were no known notable events for this encounter.

## 2024-04-24 NOTE — Interval H&P Note (Signed)
 History and Physical Interval Note:  04/24/2024 1:38 PM  [name]  has presented today for surgery, with the diagnosis of LLE Angio   ANESTHESIA   ASO w rest pain.  The various methods of treatment have been discussed with the patient and family. After consideration of risks, benefits and other options for treatment, the patient has consented to  Procedure(s): Lower Extremity Angiography (Left) as a surgical intervention.  When the patient presented for her arm procedure last week, her primary complaint was severe left foot ischemic pain.  She had seen her podiatrist who told her her circulation was markedly reduced on that left side.  As such, left leg angiogram is indicated for further evaluation and potential treatment.  The patient's history has been reviewed, patient examined, no change in status, stable for surgery.  I have reviewed the patient's chart and labs.  Questions were answered to the patient's satisfaction.     Elin Seats

## 2024-04-24 NOTE — Anesthesia Postprocedure Evaluation (Signed)
 Anesthesia Post Note  Patient: [name]  Procedure(s) Performed: Lower Extremity Angiography (Left: Leg Lower)  Patient location during evaluation: PACU Anesthesia Type: General Level of consciousness: awake and alert Pain management: pain level controlled Vital Signs Assessment: post-procedure vital signs reviewed and stable Respiratory status: spontaneous breathing, nonlabored ventilation, respiratory function stable and patient connected to nasal cannula oxygen  Cardiovascular status: blood pressure returned to baseline and stable Postop Assessment: no apparent nausea or vomiting Anesthetic complications: no   There were no known notable events for this encounter.   Last Vitals:  Vitals:   04/24/24 1645 04/24/24 1700  BP: 122/71 119/89  Pulse: 100 (!) 101  Resp: 12 14  Temp: 36.7 C   SpO2: 90% 90%    Last Pain:  Vitals:   04/24/24 1700  TempSrc:   PainSc: 0-No pain                 Debby Mines

## 2024-04-24 NOTE — Anesthesia Preprocedure Evaluation (Signed)
 Anesthesia Evaluation  Patient identified by MRN, date of birth, ID band Patient awake    Reviewed: Allergy & Precautions, NPO status , Patient's Chart, lab work & pertinent test results  Airway Mallampati: II  TM Distance: >3 FB Neck ROM: full    Dental  (+) Teeth Intact   Pulmonary shortness of breath and with exertion, COPD,  COPD inhaler, Current Smoker and Patient abstained from smoking.   Pulmonary exam normal  + decreased breath sounds      Cardiovascular hypertension, Pt. on medications + CAD, + Past MI and +CHF  + dysrhythmias Atrial Fibrillation  Rhythm:Regular Rate:Normal     Neuro/Psych     Bipolar Disorder   CVA, No Residual Symptoms negative neurological ROS  negative psych ROS   GI/Hepatic negative GI ROS,GERD  Medicated,,(+) Hepatitis -, C  Endo/Other  negative endocrine ROSHypothyroidism    Renal/GU DialysisRenal disease     Musculoskeletal   Abdominal  (+) + obese  Peds  Hematology negative hematology ROS (+)   Anesthesia Other Findings Past Medical History: 03/03/2014: Acute lacunar infarction [facility])     Comment:  a.) MRI brain 03/03/2014: two acute punctate lacunar               infarcts anteriorly in the BILATERAL centrum semiovale No date: Adult behavior problems No date: Anemia of chronic renal failure No date: Aortic atherosclerosis No date: Arthritis No date: Atrial fibrillation and flutter (HCC)     Comment:  a.) CHA2DS2VASc = 6 (sex, HFrEF, HTN, CVA x 2, prior               MI/vascular disease) as of 02/04/2024; b.) rate/rhythm               maintained on oral metoprolol  succinate; chronically               anticoagulated with apixaban  No date: Bipolar disorder (HCC) No date: Cardiomegaly No date: Cerebral microvascular disease No date: CHF (congestive heart failure) (HCC) No date: Chicken pox No date: Cholelithiasis 12/19/2021: Chronic respiratory failure with hypoxia (HCC) No  date: COPD (chronic obstructive pulmonary disease) (HCC) No date: Coronary artery disease No date: DDD (degenerative disc disease), cervical No date: DDD (degenerative disc disease), thoracolumbar No date: Diverticulosis No date: Dyspnea No date: ESRD (end stage renal disease) on dialysis (M,W,F) No date: GERD (gastroesophageal reflux disease) No date: HCV (hepatitis C virus) No date: Headache No date: HFrEF (heart failure with reduced ejection fraction) (HCC) 04/12/2014: History of delirium No date: History of substance abuse (HCC)     Comment:  a.) tobacco + cocaine 11/13/2014: Hyperkalemia No date: Hyperlipidemia No date: Hypertension 02/24/2014: Hypotension No date: Hypothyroidism No date: Ischemic cardiomyopathy 02/01/2024: Nose colonized with MRSA     Comment:  a.) presurgical PCR (+) on 02/01/2024 prior to A/V               FISTULAGRAM; b.) presurgical PCR (+) on 03/07/2024 prior               to AV FISTULA CREATION 10/08/2013: NSTEMI (non-ST elevated myocardial infarction) (HCC) 01/23/2002: NSTEMI (non-ST elevated myocardial infarction) (HCC)     Comment:  a.) LHC 01/24/2002 --> multi-vessel CAD with IRA being               90% OM2 --> delayed PCI until 01/25/2002 --> 3.0 x 23 mm               BX Velocity stent No date: Obesity No  date: 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 No date: On apixaban  therapy No date: Peripheral vascular disease No date: Pneumonia 02/27/2014: Postoperative anemia due to acute blood loss 02/21/2014: Postoperative cerebrovascular infarction following  cardiac surgery     Comment:  a.) developed RIGHT upper/lower extremity weakness               following cardiac revascularization --> neuro consult and              head CT --> Hypodensity noted in the area of the LEFT               motor cortex consistent with a component of acute               ischemia No date: Pulmonary hypertension (HCC) No date: RBBB (right bundle branch  block) No date: Renal osteodystrophy 02/20/2014: S/P CABG x 3     Comment:  a.) LIMA-LAD, SVG-OM1, SVG-PDA 05/23/2010: Stroke (HCC)     Comment:  a.) brain MRI 05/23/2010: BILATERAL acute infarcts and               old prior infarcts; RIGHT frontal lobe extending into the              white matter. Small focus of restricted diffusion in the               cortex of the medial LEFT frontoparietal lobe.               Periventricular T2 and multiple foci of T2 hyperintensity              in the subcortical white matter and increased FLAIR               signal in the cortex of the LEFT frontal lobe. 07/25/2020: Syncope and collapse  Past Surgical History: 03/30/2019: A/V FISTULAGRAM; Left     Comment:  Procedure: A/V FISTULAGRAM;  Surgeon: Marea Selinda RAMAN, MD;               Location: [facility];  Service: Cardiovascular;              Laterality: Left; 10/06/2019: A/V FISTULAGRAM; Left     Comment:  Procedure: A/V FISTULAGRAM;  Surgeon: Marea Selinda RAMAN, MD;               Location: [facility];  Service: Cardiovascular;              Laterality: Left; 05/19/2021: A/V FISTULAGRAM; Left     Comment:  Procedure: A/V FISTULAGRAM;  Surgeon: Marea Selinda RAMAN, MD;               Location: [facility];  Service: Cardiovascular;              Laterality: Left; 01/29/2022: A/V FISTULAGRAM; Left     Comment:  Procedure: A/V Fistulagram;  Surgeon: Marea Selinda RAMAN, MD;               Location: [facility];  Service: Cardiovascular;              Laterality: Left; 04/30/2022: A/V FISTULAGRAM; Left     Comment:  Procedure: A/V Fistulagram;  Surgeon: Marea Selinda RAMAN, MD;               Location: [facility];  Service: Cardiovascular;              Laterality: Left;  08/20/2022: A/V FISTULAGRAM; Left     Comment:  Procedure: A/V Fistulagram;  Surgeon: Marea Selinda RAMAN, MD;               Location: [facility];  Service: Cardiovascular;              Laterality:  Left; 12/03/2022: A/V FISTULAGRAM; N/A     Comment:  Procedure: A/V Fistulagram;  Surgeon: Marea Selinda RAMAN, MD;               Location: [facility];  Service: Cardiovascular;              Laterality: N/A; 02/11/2023: A/V FISTULAGRAM; Left     Comment:  Procedure: A/V Fistulagram;  Surgeon: Marea Selinda RAMAN, MD;               Location: [facility];  Service: Cardiovascular;              Laterality: Left; 02/07/2024: A/V FISTULAGRAM; Left     Comment:  Procedure: A/V Fistulagram;  Surgeon: Marea Selinda RAMAN, MD;               Location: [facility];  Service: Cardiovascular;              Laterality: Left; No date: COLONOSCOPY 04/22/2021: COLONOSCOPY WITH PROPOFOL ; N/A     Comment:  Procedure: COLONOSCOPY WITH PROPOFOL ;  Surgeon:               Maryruth Ole DASEN, MD;  Location: [facility];                Service: Endoscopy;  Laterality: N/A; 01/25/2002: CORONARY ANGIOPLASTY WITH STENT PLACEMENT; Left     Comment:  Procedure: CORONARY ANGIOPLASTY WITH STENT PLACEMENT;               Location: [facility]; Surgeon: Margie Lovelace, MD 02/20/2014: CORONARY ARTERY BYPASS GRAFT; N/A     Comment:  Procedure: CORONARY ARTERY BYPASS GRAFT; Location: [facility];              Surgeon: Reyes Fruits, MD No date: DIALYSIS FISTULA CREATION 04/08/2023: DIALYSIS/PERMA CATHETER INSERTION; N/A     Comment:  Procedure: DIALYSIS/PERMA CATHETER INSERTION;  Surgeon:               Marea Selinda RAMAN, MD;  Location: [facility];                Service: Cardiovascular;  Laterality: N/A; 07/06/2023: DIALYSIS/PERMA CATHETER REMOVAL; N/A     Comment:  Procedure: DIALYSIS/PERMA CATHETER REMOVAL;  Surgeon:               [name] Cordella MATSU, MD;  Location: [facility];               Service: Cardiovascular;  Laterality: N/A; No date: ESOPHAGOGASTRODUODENOSCOPY 04/22/2021: ESOPHAGOGASTRODUODENOSCOPY; N/A     Comment:  Procedure: ESOPHAGOGASTRODUODENOSCOPY (EGD);  Surgeon:               Maryruth Ole DASEN, MD;  Location: [facility];                Service: Endoscopy;  Laterality: N/A; 02/23/2019: FLEXIBLE SIGMOIDOSCOPY; N/A     Comment:  Procedure: FLEXIBLE SIGMOIDOSCOPY;  Surgeon: Gaylyn Gladis PENNER, MD;  Location: [facility];  Service:  Endoscopy;  Laterality: N/A; 02/20/2014: IABP INSERTION     Comment:  Procedure: INTRA-AORTIC BALLOON PUMP INSERTION;               Location: [facility]; Surgeon: Reyes Fruits, MD 03/09/2024: INSERTION OF ARTERIOVENOUS (AV) ARTEGRAFT ARM; Right     Comment:  Procedure: INSERTION, GRAFT, ARTERIOVENOUS, UPPER               EXTREMITY;  Surgeon: Marea Selinda RAMAN, MD;  Location: [facility]               ORS;  Service: Vascular;  Laterality: Right;  BRACHIAL               AXILLARY 01/24/2002: LEFT HEART CATH AND CORONARY ANGIOGRAPHY; Left     Comment:  Procedure: LEFT HEART CATH AND CORONARY ANGIOGRAPHY;               Location: [facility]; Surgeon: Margie Lovelace, MD 05/25/2012: LEFT HEART CATH AND CORONARY ANGIOGRAPHY; Left     Comment:  Procedure: LEFT HEART CATH AND CORONARY ANGIOGRAPHY;               Location: [facility]; Surgeon: Margie Lovelace, MD 10/09/2013: LEFT HEART CATH AND CORONARY ANGIOGRAPHY; Left     Comment:  Procedure: LEFT HEART CATH AND CORONARY ANGIOGRAPHY;               Location: [facility]; Surgeon: Vinie Jude, MD 08/08/2018: LEFT HEART CATH AND CORS/GRAFTS ANGIOGRAPHY; N/A     Comment:  Procedure: LEFT HEART CATH AND CORS/GRAFTS ANGIOGRAPHY;               Surgeon: Fernand Denyse LABOR, MD;  Location: [facility]              [facility];  Service: Cardiovascular;  Laterality: N/A; 01/30/2019: LEFT HEART CATH AND CORS/GRAFTS ANGIOGRAPHY; N/A     Comment:  Procedure: LEFT HEART CATH AND CORS/GRAFTS ANGIOGRAPHY;               Surgeon: Fernand Denyse LABOR, MD;  Location: [facility]              [facility];  Service: Cardiovascular;  Laterality: N/A; No date: ultrasound guided pericardiocentesis     Reproductive/Obstetrics negative OB  ROS                              Anesthesia Physical Anesthesia Plan  ASA: 3  Anesthesia Plan: General   Post-op Pain Management: Minimal or no pain anticipated   Induction: Intravenous  PONV Risk Score and Plan: 2 and Ondansetron , Dexamethasone , Midazolam  and Treatment may vary due to age or medical condition  Airway Management Planned: Natural Airway and Nasal Cannula  Additional Equipment: None  Intra-op Plan:   Post-operative Plan:   Informed Consent: I have reviewed the patients History and Physical, chart, labs and discussed the procedure including the risks, benefits and alternatives for the proposed anesthesia with the patient or authorized representative who has indicated his/her understanding and acceptance.     Dental Advisory Given  Plan Discussed with: CRNA  Anesthesia Plan Comments: (Discussed risks of anesthesia with patient, including possibility of difficulty with spontaneous ventilation under anesthesia necessitating airway intervention, PONV, and rare risks such as cardiac or respiratory or neurological events, and allergic reactions. Discussed the role of CRNA in patient's perioperative care. Patient understands.)         Anesthesia Quick Evaluation

## 2024-04-25 ENCOUNTER — Encounter: Payer: Self-pay | Admitting: Vascular Surgery

## 2024-04-28 ENCOUNTER — Emergency Department
Admission: EM | Admit: 2024-04-28 | Discharge: 2024-04-28 | Disposition: A | Discharge status: Home / Self Care | Attending: Emergency Medicine | Admitting: Emergency Medicine

## 2024-04-28 ENCOUNTER — Other Ambulatory Visit: Payer: Self-pay

## 2024-04-28 ENCOUNTER — Emergency Department

## 2024-04-28 DIAGNOSIS — M79605 Pain in left leg: Secondary | ICD-10-CM | POA: Diagnosis present

## 2024-04-28 DIAGNOSIS — M7989 Other specified soft tissue disorders: Secondary | ICD-10-CM | POA: Insufficient documentation

## 2024-04-28 DIAGNOSIS — I2510 Atherosclerotic heart disease of native coronary artery without angina pectoris: Secondary | ICD-10-CM | POA: Insufficient documentation

## 2024-04-28 DIAGNOSIS — N186 End stage renal disease: Secondary | ICD-10-CM | POA: Diagnosis not present

## 2024-04-28 DIAGNOSIS — M79604 Pain in right leg: Secondary | ICD-10-CM | POA: Diagnosis not present

## 2024-04-28 LAB — BASIC METABOLIC PANEL WITH GFR
Anion gap: 19 — ABNORMAL HIGH (ref 5–15)
BUN: 30 mg/dL — ABNORMAL HIGH (ref 8–23)
CO2: 24 mmol/L (ref 22–32)
Calcium: 9.3 mg/dL (ref 8.9–10.3)
Chloride: 99 mmol/L (ref 98–111)
Creatinine, Ser: 5.87 mg/dL — ABNORMAL HIGH (ref 0.44–1.00)
GFR, Estimated: 8 mL/min — ABNORMAL LOW (ref >60)
Glucose, Bld: 79 mg/dL (ref 70–99)
Potassium: 4.1 mmol/L (ref 3.5–5.1)
Sodium: 142 mmol/L (ref 135–145)

## 2024-04-28 LAB — CBC WITH DIFFERENTIAL/PLATELET
Abs Immature Granulocytes: 0.03 K/uL (ref 0.00–0.07)
Basophils Absolute: 0 K/uL (ref 0.0–0.1)
Basophils Relative: 0 %
Eosinophils Absolute: 0.2 K/uL (ref 0.0–0.5)
Eosinophils Relative: 3 %
HCT: 39.5 % (ref 36.0–46.0)
Hemoglobin: 12.6 g/dL (ref 12.0–15.0)
Immature Granulocytes: 1 %
Lymphocytes Relative: 15 %
Lymphs Abs: 0.8 K/uL (ref 0.7–4.0)
MCH: 31.9 pg (ref 26.0–34.0)
MCHC: 31.9 g/dL (ref 30.0–36.0)
MCV: 100 fL (ref 80.0–100.0)
Monocytes Absolute: 0.8 K/uL (ref 0.1–1.0)
Monocytes Relative: 14 %
Neutro Abs: 3.5 K/uL (ref 1.7–7.7)
Neutrophils Relative %: 67 %
Platelets: 109 K/uL — ABNORMAL LOW (ref 150–400)
RBC: 3.95 MIL/uL (ref 3.87–5.11)
RDW: 16 % — ABNORMAL HIGH (ref 11.5–15.5)
WBC: 5.2 K/uL (ref 4.0–10.5)
nRBC: 0.4 % — ABNORMAL HIGH (ref 0.0–0.2)

## 2024-04-28 MED ORDER — OXYCODONE-ACETAMINOPHEN 5-325 MG PO TABS
1.0000 | ORAL_TABLET | Freq: Once | ORAL | Status: AC
  Administered 2024-04-28: 1 via ORAL
  Filled 2024-04-28: qty 1

## 2024-04-28 MED ORDER — OXYCODONE-ACETAMINOPHEN 5-325 MG PO TABS: 1.0000 | ORAL_TABLET | ORAL | 0 refills | Status: AC | PRN

## 2024-04-28 NOTE — ED Provider Notes (Signed)
 [facility] Provider Note    Event Date/Time   First MD Initiated Contact with Patient 04/28/24 1522     (approximate)   History   Leg Pain   HPI  [name] is a 63 y.o. female with a history of peripheral artery disease, ESRD, and CAD who presents with left leg pain and swelling which has been present for several weeks, but has gotten worse since the recent angiogram she had several days ago.  She is worried that there could be a circulation problem although she was told that her circulation was fine.  She reports pain throughout the lower leg from the knee down, sharp and burning pain, worse with even minor touch.  She denies any numbness or weakness.  She denies any trauma.  She reports mild swelling.  She states that she is starting to have a milder version of this pain in the right leg as well.  I reviewed the past medical records.  The patient had an angiogram for rest pain of the left foot by Dr. Marea on 11/10.  There were calcific changes, but the vessels were found to be patent.   Physical Exam   Triage Vital Signs: ED Triage Vitals  Encounter Vitals Group     BP 04/28/24 1424 (!) 147/97     Girls Systolic BP Percentile --      Girls Diastolic BP Percentile --      Boys Systolic BP Percentile --      Boys Diastolic BP Percentile --      Pulse Rate 04/28/24 1424 100     Resp 04/28/24 1424 20     Temp 04/28/24 1424 98 F (36.7 C)     Temp Source 04/28/24 1424 Oral     SpO2 04/28/24 1424 96 %     Weight 04/28/24 1423 166 lb (75.3 kg)     Height 04/28/24 1423 5' 6 (1.676 m)     Head Circumference --      Peak Flow --      Pain Score 04/28/24 1423 10     Pain Loc --      Pain Education --      Exclude from Growth Chart --     Most recent vital signs: Vitals:   04/28/24 1424  BP: (!) 147/97  Pulse: 100  Resp: 20  Temp: 98 F (36.7 C)  SpO2: 96%     General: Alert, well-appearing, no distress.  CV:  Good peripheral  perfusion.  Resp:  Normal effort.  Abd:  No distention.  Other:  Bilateral lower extremities with venous stasis type skin changes.  LLE with 2+ DP pulse, normal cap refill, no erythema, induration, or abnormal warmth.  Motor and sensory intact distally.  Tenderness to light touch throughout the lower leg.   ED Results / Procedures / Treatments   Labs (all labs ordered are listed, but only abnormal results are displayed) Labs Reviewed  BASIC METABOLIC PANEL WITH GFR - Abnormal; Notable for the following components:      Result Value   BUN 30 (*)    Creatinine, Ser 5.87 (*)    GFR, Estimated 8 (*)    Anion gap 19 (*)    All other components within normal limits  CBC WITH DIFFERENTIAL/PLATELET - Abnormal; Notable for the following components:   RDW 16.0 (*)    Platelets 109 (*)    nRBC 0.4 (*)    All other components within normal limits  EKG     RADIOLOGY  US  venous LLE: No acute DVT  PROCEDURES:  Critical Care performed: No  Procedures   MEDICATIONS ORDERED IN ED: Medications  oxyCODONE -acetaminophen  (PERCOCET/ROXICET) 5-325 MG per tablet 1 tablet (1 tablet Oral Given 04/28/24 1554)  oxyCODONE -acetaminophen  (PERCOCET/ROXICET) 5-325 MG per tablet 1 tablet (1 tablet Oral Given 04/28/24 1728)     IMPRESSION / MDM / ASSESSMENT AND PLAN / ED COURSE  I reviewed the triage vital signs and the nursing notes.  63 year old female with PMH as noted above presents with atraumatic left lower leg pain which has been persistent for a while and seems to have worsened since her recent angiogram that she had on 11/10.  Exam reveals normal vital signs and the left lower extremity is neuro/vascular intact with some venous stasis changes.  Differential diagnosis includes, but is not limited to, peripheral neuropathy, venous stasis, lymphedema electrolyte abnormality, DVT.  There is no evidence of cellulitis.  I discussed the case with Dr. Marea who did the angiogram and confirms that  he has no vascular concerns, and the patient's exam is not consistent with an arterial occlusion or other acute peripheral artery disease.  We will obtain basic labs, DVT study (the patient last had one in May) and reassess.  Patient's presentation is most consistent with acute complicated illness / injury requiring diagnostic workup.  ----------------------------------------- 5:48 PM on 04/28/2024 -----------------------------------------  DVT study is negative.  BMP shows no significant electrode abnormalities.  CMP is unremarkable with no leukocytosis.  The patient's pain is controlled well with Percocet.  She is stable for discharge home.  I recommended elevation, compression stockings analgesia, and follow-up with her primary care provider.  The patient feels comfortable going home, and is in agreement with the plan.  I gave strict return precautions, and she expresses understanding.   FINAL CLINICAL IMPRESSION(S) / ED DIAGNOSES   Final diagnoses:  Left leg pain     Rx / DC Orders   ED Discharge Orders          Ordered    oxyCODONE -acetaminophen  (PERCOCET) 5-325 MG tablet  Every 4 hours PRN        04/28/24 1723             Note:  This document was prepared using Dragon voice recognition software and may include unintentional dictation errors.    Jacolyn Pae, MD 04/28/24 [phone]

## 2024-04-28 NOTE — ED Notes (Signed)
 Pt to [place] via wheelchair.

## 2024-04-28 NOTE — ED Triage Notes (Signed)
 Patient states left lower leg pain from knee down to left toes; states discoloration as well. Patient has seen Podiatry and Vascular and told everything was normal.

## 2024-04-28 NOTE — ED Notes (Signed)
 ED Provider at bedside.

## 2024-04-28 NOTE — Discharge Instructions (Addendum)
 Your workup in the ER is reassuring.  There are no signs of a blood clot.  Keep the leg elevated.  You may use an Ace bandage or compression sock to help with the swelling.  Take the Percocet as needed for pain.  Make an appointment to follow-up with your primary care provider as soon as possible.  We suspect he likely may have neuropathy, or other nerve pain.  Return to the ER immediately for new, worsening, or persistent severe pain, numbness, inability to put weight on the leg, increasing swelling or redness, or any other new or worsening symptoms that concern you.

## 2024-05-06 NOTE — Anesthesia Postprocedure Evaluation (Signed)
 Anesthesia Post Note  Patient: [name]  Procedure(s) Performed: LIGATION ARTERIOVENOUS GORTEX GRAFT (Right) INSERTION OF DIALYSIS CATHETER  Patient location during evaluation: PACU Anesthesia Type: General Level of consciousness: awake and alert Pain management: pain level controlled Vital Signs Assessment: post-procedure vital signs reviewed and stable Respiratory status: spontaneous breathing, nonlabored ventilation, respiratory function stable and patient connected to nasal cannula oxygen  Cardiovascular status: blood pressure returned to baseline and stable Postop Assessment: no apparent nausea or vomiting Anesthetic complications: no   No notable events documented.   Last Vitals:  Vitals:   04/20/24 1036 04/20/24 1058  BP: (!) 94/56 106/76  Pulse: 97 (!) 105  Resp: 10 12  Temp: (!) 36.2 C (!) 36.2 C  SpO2: 94% 95%    Last Pain:  Vitals:   04/20/24 1058  TempSrc: Temporal  PainSc:                  Prentice Murphy

## 2024-05-09 ENCOUNTER — Ambulatory Visit (INDEPENDENT_AMBULATORY_CARE_PROVIDER_SITE_OTHER): Admitting: Podiatry

## 2024-05-09 ENCOUNTER — Encounter (INDEPENDENT_AMBULATORY_CARE_PROVIDER_SITE_OTHER): Payer: Self-pay | Admitting: Vascular Surgery

## 2024-05-09 ENCOUNTER — Ambulatory Visit (INDEPENDENT_AMBULATORY_CARE_PROVIDER_SITE_OTHER): Admitting: Vascular Surgery

## 2024-05-09 ENCOUNTER — Encounter: Payer: Self-pay | Admitting: Podiatry

## 2024-05-09 VITALS — Ht 66.0 in | Wt 166.0 lb

## 2024-05-09 VITALS — BP 105/65 | HR 92 | Resp 18

## 2024-05-09 DIAGNOSIS — Z992 Dependence on renal dialysis: Secondary | ICD-10-CM

## 2024-05-09 DIAGNOSIS — L03116 Cellulitis of left lower limb: Secondary | ICD-10-CM

## 2024-05-09 DIAGNOSIS — M79605 Pain in left leg: Secondary | ICD-10-CM

## 2024-05-09 DIAGNOSIS — N186 End stage renal disease: Secondary | ICD-10-CM

## 2024-05-09 DIAGNOSIS — I10 Essential (primary) hypertension: Secondary | ICD-10-CM

## 2024-05-09 MED ORDER — OXYCODONE-ACETAMINOPHEN 5-325 MG PO TABS: 1.0000 | ORAL_TABLET | Freq: Four times a day (QID) | ORAL | 0 refills | Status: DC | PRN

## 2024-05-09 MED ORDER — DOXYCYCLINE HYCLATE 100 MG PO TABS: 100.0000 mg | ORAL_TABLET | Freq: Two times a day (BID) | ORAL | 0 refills | Status: DC

## 2024-05-09 NOTE — Assessment & Plan Note (Signed)
 blood pressure control important in reducing the progression of atherosclerotic disease. On appropriate oral medications.

## 2024-05-09 NOTE — Progress Notes (Signed)
 MRN : [id number]  [name] is a 63 y.o. (1960/07/27) female who presents with chief complaint of  Chief Complaint  Patient presents with   Follow-up    [facility] 2 week follow up  .  History of Present Illness: Patient returns today in follow up of multiple issues.  We ligated her right arm AV graft recently for severe steal syndrome.  Her right arm is feeling much better and no longer hurting her.  She has definitely gotten some muscular atrophy on that right side and I recommended she get a squeeze ball and exercise it more. Her bigger complaint is of severe left foot pain.  We actually did an angiogram on her a few weeks ago and there was no significant arterial disease in the left lower extremity.  She is seeing podiatry and is scheduled for an MRI tomorrow.  Current Outpatient Medications  Medication Sig Dispense Refill   albuterol  (VENTOLIN  HFA) 108 (90 Base) MCG/ACT inhaler Inhale 1-2 puffs into the lungs every 6 (six) hours as needed for wheezing or shortness of breath. 20.1 g 3   apixaban  (ELIQUIS ) 5 MG TABS tablet Take 5 mg by mouth 2 (two) times daily.     colchicine  0.6 MG tablet Take 1 tablet (0.6 mg total) by mouth once for 1 dose. 1 tablet 0   doxycycline  (VIBRA -TABS) 100 MG tablet Take 1 tablet (100 mg total) by mouth 2 (two) times daily. 20 tablet 0   hydrOXYzine  (ATARAX /VISTARIL ) 50 MG tablet Take 50 mg by mouth at bedtime.     levothyroxine  (SYNTHROID ) 75 MCG tablet Take 75 mcg by mouth daily before breakfast.      lidocaine -prilocaine  (EMLA ) cream Apply 1 Application topically once.     metoprolol  succinate (TOPROL -XL) 25 MG 24 hr tablet Take 1 tablet by mouth every morning.     midodrine  (PROAMATINE ) 10 MG tablet Take 1 tablet (10 mg total) by mouth 3 (three) times daily. 90 tablet 0   oxyCODONE -acetaminophen  (PERCOCET) 5-325 MG tablet Take 1 tablet by mouth every 6 (six) hours as needed for severe pain (pain score 7-10). 28 tablet 0   pantoprazole  (PROTONIX ) 40 MG  tablet Take 40 mg by mouth every morning.     ranolazine  (RANEXA ) 1000 MG SR tablet Take 1,000 mg by mouth 2 (two) times daily.     rosuvastatin  (CRESTOR ) 20 MG tablet Take 20 mg by mouth at bedtime.     sevelamer  carbonate (RENVELA ) 800 MG tablet Take 3,200 mg by mouth 3 (three) times daily with meals.     triamcinolone  cream (KENALOG ) 0.1 % Apply 1 Application topically 2 (two) times daily. 453.6 g 2   No current facility-administered medications for this visit.   Facility-Administered Medications Ordered in Other Visits  Medication Dose Route Frequency Provider Last Rate Last Admin   heparin  injection    PRN Marea Selinda RAMAN, MD   10,000 Units at 04/08/23 1412    Past Medical History:  Diagnosis Date   Acute lacunar infarction (HCC) 03/03/2014   a.) MRI brain 03/03/2014: two acute punctate lacunar infarcts anteriorly in the BILATERAL centrum semiovale   Adult behavior problems    Anemia of chronic renal failure    Aortic atherosclerosis    Arthritis    Atrial fibrillation and flutter (HCC)    a.) CHA2DS2VASc = 6 (sex, HFrEF, HTN, CVA x 2, prior MI/vascular disease) as of 02/04/2024; b.) rate/rhythm maintained on oral metoprolol  succinate; chronically anticoagulated with apixaban    Bipolar disorder (  HCC)    Cardiomegaly    Cerebral microvascular disease    CHF (congestive heart failure) (HCC)    Chicken pox    Cholelithiasis    Chronic respiratory failure with hypoxia (HCC) 12/19/2021   COPD (chronic obstructive pulmonary disease) (HCC)    Coronary artery disease    DDD (degenerative disc disease), cervical    DDD (degenerative disc disease), thoracolumbar    Diverticulosis    Dyspnea    ESRD (end stage renal disease) on dialysis (M,W,F)    GERD (gastroesophageal reflux disease)    Gout    HCV (hepatitis C virus)    Headache    HFrEF (heart failure with reduced ejection fraction) (HCC)    History of delirium 04/12/2014   History of substance abuse (HCC)    a.) tobacco +  cocaine   Hyperkalemia 11/13/2014   Hyperlipidemia    Hypertension    Hypotension 02/24/2014   Hypothyroidism    Ischemic cardiomyopathy    Nose colonized with MRSA 02/01/2024   a.) presurgical PCR (+) on 02/01/2024 prior to A/V FISTULAGRAM; b.) presurgical PCR (+) on 03/07/2024 prior to AV FISTULA CREATION   NSTEMI (non-ST elevated myocardial infarction) (HCC) 10/08/2013   NSTEMI (non-ST elevated myocardial infarction) (HCC) 01/23/2002   a.) LHC 01/24/2002 --> multi-vessel CAD with IRA being 90% OM2 --> delayed PCI until 01/25/2002 --> 3.0 x 23 mm BX Velocity stent   Obesity    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    On apixaban  therapy    Peripheral vascular disease    Pneumonia    Postoperative anemia due to acute blood loss 02/27/2014   Postoperative cerebrovascular infarction following cardiac surgery 02/21/2014   a.) developed RIGHT upper/lower extremity weakness following cardiac revascularization --> neuro consult and head CT --> Hypodensity noted in the area of the LEFT motor cortex consistent with a component of acute ischemia   Pulmonary hypertension (HCC)    RBBB (right bundle branch block)    Renal osteodystrophy    S/P CABG x 3 02/20/2014   a.) LIMA-LAD, SVG-OM1, SVG-PDA   Stroke (HCC) 05/23/2010   a.) brain MRI 05/23/2010: BILATERAL acute infarcts and old prior infarcts; RIGHT frontal lobe extending into the white matter. Small focus of restricted diffusion in the cortex of the medial LEFT frontoparietal lobe. Periventricular T2 and multiple foci of T2 hyperintensity in the subcortical white matter and increased FLAIR signal in the cortex of the LEFT frontal lobe.   Syncope and collapse 07/25/2020    Past Surgical History:  Procedure Laterality Date   A/V FISTULAGRAM Left 03/30/2019   Procedure: A/V FISTULAGRAM;  Surgeon: Marea Selinda RAMAN, MD;  Location: [facility];  Service: Cardiovascular;  Laterality: Left;   A/V FISTULAGRAM Left 10/06/2019    Procedure: A/V FISTULAGRAM;  Surgeon: Marea Selinda RAMAN, MD;  Location: [facility];  Service: Cardiovascular;  Laterality: Left;   A/V FISTULAGRAM Left 05/19/2021   Procedure: A/V FISTULAGRAM;  Surgeon: Marea Selinda RAMAN, MD;  Location: [facility];  Service: Cardiovascular;  Laterality: Left;   A/V FISTULAGRAM Left 01/29/2022   Procedure: A/V Fistulagram;  Surgeon: Marea Selinda RAMAN, MD;  Location: [facility];  Service: Cardiovascular;  Laterality: Left;   A/V FISTULAGRAM Left 04/30/2022   Procedure: A/V Fistulagram;  Surgeon: Marea Selinda RAMAN, MD;  Location: [facility];  Service: Cardiovascular;  Laterality: Left;   A/V FISTULAGRAM Left 08/20/2022   Procedure: A/V Fistulagram;  Surgeon: Marea Selinda RAMAN,  MD;  Location: [facility];  Service: Cardiovascular;  Laterality: Left;   A/V FISTULAGRAM N/A 12/03/2022   Procedure: A/V Fistulagram;  Surgeon: Marea Selinda RAMAN, MD;  Location: [facility];  Service: Cardiovascular;  Laterality: N/A;   A/V FISTULAGRAM Left 02/11/2023   Procedure: A/V Fistulagram;  Surgeon: Marea Selinda RAMAN, MD;  Location: [facility];  Service: Cardiovascular;  Laterality: Left;   A/V FISTULAGRAM Left 02/07/2024   Procedure: A/V Fistulagram;  Surgeon: Marea Selinda RAMAN, MD;  Location: [facility];  Service: Cardiovascular;  Laterality: Left;   COLONOSCOPY     COLONOSCOPY WITH PROPOFOL  N/A 04/22/2021   Procedure: COLONOSCOPY WITH PROPOFOL ;  Surgeon: Maryruth Ole DASEN, MD;  Location: [facility];  Service: Endoscopy;  Laterality: N/A;   CORONARY ANGIOPLASTY WITH STENT PLACEMENT Left 01/25/2002   Procedure: CORONARY ANGIOPLASTY WITH STENT PLACEMENT; Location: [facility]; Surgeon: Margie Lovelace, MD   CORONARY ARTERY BYPASS GRAFT N/A 02/20/2014   Procedure: CORONARY ARTERY BYPASS GRAFT; Location: [facility]; Surgeon: Reyes Fruits, MD   DIALYSIS FISTULA CREATION     DIALYSIS/PERMA CATHETER INSERTION N/A 04/08/2023   Procedure: DIALYSIS/PERMA  CATHETER INSERTION;  Surgeon: Marea Selinda RAMAN, MD;  Location: [facility];  Service: Cardiovascular;  Laterality: N/A;   DIALYSIS/PERMA CATHETER REMOVAL N/A 07/06/2023   Procedure: DIALYSIS/PERMA CATHETER REMOVAL;  Surgeon: [name] Cordella MATSU, MD;  Location: [facility];  Service: Cardiovascular;  Laterality: N/A;   ESOPHAGOGASTRODUODENOSCOPY     ESOPHAGOGASTRODUODENOSCOPY N/A 04/22/2021   Procedure: ESOPHAGOGASTRODUODENOSCOPY (EGD);  Surgeon: Maryruth Ole DASEN, MD;  Location: [facility] ENDOSCOPY;  Service: Endoscopy;  Laterality: N/A;   FLEXIBLE SIGMOIDOSCOPY N/A 02/23/2019   Procedure: FLEXIBLE SIGMOIDOSCOPY;  Surgeon: Gaylyn Gladis PENNER, MD;  Location: [facility] ENDOSCOPY;  Service: Endoscopy;  Laterality: N/A;   IABP INSERTION  02/20/2014   Procedure: INTRA-AORTIC BALLOON PUMP INSERTION; Location: [facility]; Surgeon: Reyes Fruits, MD   INSERTION OF ARTERIOVENOUS (AV) ARTEGRAFT ARM Right 03/09/2024   Procedure: INSERTION, GRAFT, ARTERIOVENOUS, UPPER EXTREMITY;  Surgeon: Marea Selinda RAMAN, MD;  Location: [facility];  Service: Vascular;  Laterality: Right;  BRACHIAL AXILLARY   INSERTION OF DIALYSIS CATHETER N/A 04/20/2024   Procedure: INSERTION OF DIALYSIS CATHETER;  Surgeon: Marea Selinda RAMAN, MD;  Location: [facility];  Service: Vascular;  Laterality: N/A;   LEFT HEART CATH AND CORONARY ANGIOGRAPHY Left 01/24/2002   Procedure: LEFT HEART CATH AND CORONARY ANGIOGRAPHY; Location: [facility]; Surgeon: Margie Lovelace, MD   LEFT HEART CATH AND CORONARY ANGIOGRAPHY Left 05/25/2012   Procedure: LEFT HEART CATH AND CORONARY ANGIOGRAPHY; Location: [facility]; Surgeon: Margie Lovelace, MD   LEFT HEART CATH AND CORONARY ANGIOGRAPHY Left 10/09/2013   Procedure: LEFT HEART CATH AND CORONARY ANGIOGRAPHY; Location: [facility]; Surgeon: Vinie Jude, MD   LEFT HEART CATH AND CORS/GRAFTS ANGIOGRAPHY N/A 08/08/2018   Procedure: LEFT HEART CATH AND CORS/GRAFTS ANGIOGRAPHY;  Surgeon: Fernand Denyse LABOR, MD;  Location: [facility];   Service: Cardiovascular;  Laterality: N/A;   LEFT HEART CATH AND CORS/GRAFTS ANGIOGRAPHY N/A 01/30/2019   Procedure: LEFT HEART CATH AND CORS/GRAFTS ANGIOGRAPHY;  Surgeon: Fernand Denyse LABOR, MD;  Location: [facility];  Service: Cardiovascular;  Laterality: N/A;   LIGATION ARTERIOVENOUS GORTEX GRAFT Right 04/20/2024   Procedure: LIGATION ARTERIOVENOUS GORTEX GRAFT;  Surgeon: Marea Selinda RAMAN, MD;  Location: [facility];  Service: Vascular;  Laterality: Right;   LOWER EXTREMITY ANGIOGRAPHY Left 04/24/2024   Procedure: Lower Extremity Angiography;  Surgeon: Marea Selinda RAMAN, MD;  Location: [facility];  Service: Cardiovascular;  Laterality: Left;   ultrasound guided pericardiocentesis     UPPER EXTREMITY ANGIOGRAPHY Right 03/30/2024   Procedure: Upper Extremity Angiography;  Surgeon: Marea Selinda RAMAN, MD;  Location: [facility];  Service: Cardiovascular;  Laterality: Right;     Social History   Tobacco Use   Smoking status: Every Day    Current packs/day: 0.25    Average packs/day: 0.3 packs/day for 20.0 years (5.0 ttl pk-yrs)    Types: Cigarettes   Smokeless tobacco: Never   Tobacco comments:    smokes 1 - 1.5 cigarettes a day  Vaping Use   Vaping status: Never Used  Substance Use Topics   Alcohol use: No    Comment: social   Drug use: No       Family History  Problem Relation Age of Onset   Hypertension Mother    Ovarian cancer Mother    Diabetes type II Mother    Hypertension Father    Kidney disease Father    Hypertension Sister    Diabetes type II Maternal Grandmother    Breast cancer Maternal Grandmother    Breast cancer Maternal Aunt    Hypertension Other    Cancer Other    Renal Disease Other      Allergies  Allergen Reactions   Morphine  And Codeine Hives   Levaquin [Levofloxacin] Itching and Other (See Comments)    Severe itching; prickly sensation; severe left leg pain; immobility.   Enalapril  Other (See Comments)    hallucinations   Latex  Rash   Tape Rash and Other (See Comments)      Physical Examination  BP 105/65 (BP Location: Right Leg)   Pulse 92   Resp 18  Gen:  WD/WN, patient somewhat lethargic and not as interactive as usual. Head: [place]/AT, No temporalis wasting. Ear/Nose/Throat: Hearing grossly intact, nares w/o erythema or drainage Eyes: Conjunctiva clear. Sclera non-icteric Neck: Supple.  Trachea midline Pulmonary:  Good air movement, no use of accessory muscles.  Cardiac: RRR, no JVD Vascular: left arm AVF aneurysmal but patent. Right arm surgery site healing well. Vessel Right Left  Radial Palpable Palpable       Musculoskeletal: M/S 5/5 throughout.  No deformity or atrophy. 1+ BLE edema. Neurologic: Sensation grossly intact in extremities.  Symmetrical.  Speech is fluent.  Psychiatric: Judgment intact, Mood & affect appropriate for pt's clinical situation. Dermatologic: No rashes or ulcers noted.  No cellulitis or open wounds.      Labs Recent Results (from the past 2160 hours)  Basic metabolic panel     Status: Abnormal   Collection Time: 03/07/24 11:11 AM  Result Value Ref Range   Sodium 139 135 - 145 mmol/L   Potassium 4.0 3.5 - 5.1 mmol/L   Chloride 97 (L) 98 - 111 mmol/L   CO2 24 22 - 32 mmol/L   Glucose, Bld 88 70 - 99 mg/dL    Comment: Glucose reference range applies only to samples taken after fasting for at least 8 hours.   BUN 52 (H) 8 - 23 mg/dL   Creatinine, Ser 3.03 (H) 0.44 - 1.00 mg/dL   Calcium  8.7 (L) 8.9 - 10.3 mg/dL   GFR, Estimated 6 (L) >60 mL/min    Comment: (NOTE) Calculated using the CKD-EPI Creatinine Equation (2021)    Anion gap 18 (H) 5 - 15    Comment: Performed at [facility], [address] [place], [place] [address]  Type and screen [facility]  Status: None   Collection Time: 03/07/24 11:11 AM  Result Value Ref Range   ABO/RH(D) O POS    Antibody Screen NEG    Sample Expiration 03/21/2024,2359    Extend sample  reason NO TRANSFUSIONS OR PREGNANCY IN THE PAST 3 MONTHS    Unit Number [id number]    Blood Component Type RED CELLS,LR    Unit division 00    Status of Unit REL FROM [facility]    Transfusion Status OK TO TRANSFUSE    Crossmatch Result COMPATIBLE    Unit Number [id number]    Blood Component Type RBC LR PHER1    Unit division 00    Status of Unit REL FROM [facility]    Transfusion Status OK TO TRANSFUSE    Crossmatch Result COMPATIBLE   Surgical pcr screen     Status: Abnormal   Collection Time: 03/07/24 11:11 AM   Specimen: Nasal Mucosa; Nasal Swab  Result Value Ref Range   MRSA, PCR POSITIVE (A) NEGATIVE    Comment: RESULT CALLED TO, READ BACK BY AND VERIFIED WITH: BRYAN GRAY 03/07/24 1409 MW    Staphylococcus aureus POSITIVE (A) NEGATIVE    Comment: (NOTE) The Xpert SA Assay (FDA approved for NASAL specimens in patients 52 years of age and older), is one component of a comprehensive surveillance program. It is not intended to diagnose infection nor to guide or monitor treatment. Performed at [facility], [address] [place], [place] [address]   CBC with Differential/Platelet     Status: Abnormal   Collection Time: 03/07/24 11:11 AM  Result Value Ref Range   WBC 6.4 4.0 - 10.5 K/uL   RBC 4.52 3.87 - 5.11 MIL/uL   Hemoglobin 14.3 12.0 - 15.0 g/dL   HCT 55.0 63.9 - 53.9 %   MCV 99.3 80.0 - 100.0 fL   MCH 31.6 26.0 - 34.0 pg   MCHC 31.8 30.0 - 36.0 g/dL   RDW 84.2 (H) 88.4 - 84.4 %   Platelets 146 (L) 150 - 400 K/uL   nRBC 0.0 0.0 - 0.2 %   Neutrophils Relative % 67 %   Neutro Abs 4.3 1.7 - 7.7 K/uL   Lymphocytes Relative 19 %   Lymphs Abs 1.2 0.7 - 4.0 K/uL   Monocytes Relative 12 %   Monocytes Absolute 0.8 0.1 - 1.0 K/uL   Eosinophils Relative 1 %   Eosinophils Absolute 0.0 0.0 - 0.5 K/uL   Basophils Relative 0 %   Basophils Absolute 0.0 0.0 - 0.1 K/uL   Immature Granulocytes 1 %   Abs Immature Granulocytes 0.03 0.00 - 0.07 K/uL    Comment: Performed  at [facility], [address] [place], [place] [address]  BPAM RBC     Status: None   Collection Time: 03/07/24 11:11 AM  Result Value Ref Range   Blood Product Unit Number [id number]    PRODUCT CODE E0382V00    Unit Type and Rh 5100    Blood Product Expiration Date 797489817640    Blood Product Unit Number [id number]    PRODUCT CODE [id number]    Unit Type and Rh 5100    Blood Product Expiration Date 797489817640   I-STAT, chem 8     Status: Abnormal   Collection Time: 03/09/24  6:51 AM  Result Value Ref Range   Sodium 132 (L) 135 - 145 mmol/L   Potassium 5.6 (H) 3.5 - 5.1 mmol/L   Chloride 104 98 - 111 mmol/L   BUN 67 (  H) 8 - 23 mg/dL   Creatinine, Ser 3.39 (H) 0.44 - 1.00 mg/dL   Glucose, Bld 896 (H) 70 - 99 mg/dL    Comment: Glucose reference range applies only to samples taken after fasting for at least 8 hours.   Calcium , Ion 0.71 (LL) 1.15 - 1.40 mmol/L   TCO2 24 22 - 32 mmol/L   Hemoglobin 15.0 12.0 - 15.0 g/dL   HCT 55.9 63.9 - 53.9 %  Potassium     Status: Abnormal   Collection Time: 03/09/24  7:15 AM  Result Value Ref Range   Potassium 3.4 (L) 3.5 - 5.1 mmol/L    Comment: Performed at [facility], [address] [place], [place] [address]  Potassium [facility] vascular lab only)     Status: Abnormal   Collection Time: 03/30/24 11:54 AM  Result Value Ref Range   Potassium [facility] vascular lab) 6.2 (H) 3.5 - 5.1 mmol/L    Comment: Performed at [facility], [address] [place], [place] [address]  Potassium     Status: Abnormal   Collection Time: 03/30/24 12:42 PM  Result Value Ref Range   Potassium 5.7 (H) 3.5 - 5.1 mmol/L    Comment: Performed at [facility], [address], [place], [place] [address]  Basic metabolic panel     Status: Abnormal   Collection Time: 04/13/24  9:37 AM  Result Value Ref Range   Sodium 140 135 - 145 mmol/L   Potassium 4.2 3.5 - 5.1 mmol/L   Chloride 99 98 - 111 mmol/L   CO2 23 22 - 32  mmol/L   Glucose, Bld 81 70 - 99 mg/dL    Comment: Glucose reference range applies only to samples taken after fasting for at least 8 hours.   BUN 20 8 - 23 mg/dL   Creatinine, Ser 4.51 (H) 0.44 - 1.00 mg/dL   Calcium  8.4 (L) 8.9 - 10.3 mg/dL   GFR, Estimated 8 (L) >60 mL/min    Comment: (NOTE) Calculated using the CKD-EPI Creatinine Equation (2021)    Anion gap 18 (H) 5 - 15    Comment: Performed at [facility], [address] [place], [place] [address]  Type and screen [facility]     Status: None   Collection Time: 04/20/24  6:48 AM  Result Value Ref Range   ABO/RH(D) O POS    Antibody Screen NEG    Sample Expiration      04/23/2024,2359 Performed at [facility], [address] [place], [place] [address]   I-STAT, [place] 8     Status: Abnormal   Collection Time: 04/20/24  7:08 AM  Result Value Ref Range   Sodium 141 135 - 145 mmol/L   Potassium 4.2 3.5 - 5.1 mmol/L   Chloride 102 98 - 111 mmol/L   BUN 32 (H) 8 - 23 mg/dL   Creatinine, Ser 4.69 (H) 0.44 - 1.00 mg/dL   Glucose, Bld 66 (L) 70 - 99 mg/dL    Comment: Glucose reference range applies only to samples taken after fasting for at least 8 hours.   Calcium , Ion 0.99 (L) 1.15 - 1.40 mmol/L   TCO2 26 22 - 32 mmol/L   Hemoglobin 16.0 (H) 12.0 - 15.0 g/dL   HCT 52.9 (H) 63.9 - 53.9 %  Glucose, capillary     Status: None   Collection Time: 04/20/24 10:36 AM  Result Value Ref Range   Glucose-Capillary 99 70 - 99 mg/dL  Comment: Glucose reference range applies only to samples taken after fasting for at least 8 hours.  Potassium [facility] vascular lab only)     Status: None   Collection Time: 04/24/24  1:56 PM  Result Value Ref Range   Potassium [facility] vascular lab) 3.5 3.5 - 5.1 mmol/L    Comment: Performed at [facility], [address] [place], [place] [address]  Basic metabolic panel     Status: Abnormal   Collection Time: 04/28/24  3:41 PM  Result Value Ref Range    Sodium 142 135 - 145 mmol/L   Potassium 4.1 3.5 - 5.1 mmol/L   Chloride 99 98 - 111 mmol/L   CO2 24 22 - 32 mmol/L   Glucose, Bld 79 70 - 99 mg/dL    Comment: Glucose reference range applies only to samples taken after fasting for at least 8 hours.   BUN 30 (H) 8 - 23 mg/dL   Creatinine, Ser 4.12 (H) 0.44 - 1.00 mg/dL   Calcium  9.3 8.9 - 10.3 mg/dL   GFR, Estimated 8 (L) >60 mL/min    Comment: (NOTE) Calculated using the CKD-EPI Creatinine Equation (2021)    Anion gap 19 (H) 5 - 15    Comment: Performed at [facility], [address] [place], [place] [address]  CBC with Differential     Status: Abnormal   Collection Time: 04/28/24  3:41 PM  Result Value Ref Range   WBC 5.2 4.0 - 10.5 K/uL   RBC 3.95 3.87 - 5.11 MIL/uL   Hemoglobin 12.6 12.0 - 15.0 g/dL   HCT 60.4 63.9 - 53.9 %   MCV 100.0 80.0 - 100.0 fL   MCH 31.9 26.0 - 34.0 pg   MCHC 31.9 30.0 - 36.0 g/dL   RDW 83.9 (H) 88.4 - 84.4 %   Platelets 109 (L) 150 - 400 K/uL   nRBC 0.4 (H) 0.0 - 0.2 %   Neutrophils Relative % 67 %   Neutro Abs 3.5 1.7 - 7.7 K/uL   Lymphocytes Relative 15 %   Lymphs Abs 0.8 0.7 - 4.0 K/uL   Monocytes Relative 14 %   Monocytes Absolute 0.8 0.1 - 1.0 K/uL   Eosinophils Relative 3 %   Eosinophils Absolute 0.2 0.0 - 0.5 K/uL   Basophils Relative 0 %   Basophils Absolute 0.0 0.0 - 0.1 K/uL   Immature Granulocytes 1 %   Abs Immature Granulocytes 0.03 0.00 - 0.07 K/uL    Comment: Performed at [facility], [address] [place], [place] [address]    Radiology US  Venous Img Lower Unilateral Left Result Date: 04/28/2024 CLINICAL DATA:  Left lower extremity edema. EXAM: LEFT LOWER EXTREMITY VENOUS DOPPLER ULTRASOUND TECHNIQUE: Gray-scale sonography with graded compression, as well as color Doppler and duplex ultrasound were performed to evaluate the lower extremity deep venous systems from the level of the common femoral vein and including the common femoral, femoral,  profunda femoral, popliteal and calf veins including the posterior tibial, peroneal and gastrocnemius veins when visible. The superficial great saphenous vein was also interrogated. Spectral Doppler was utilized to evaluate flow at rest and with distal augmentation maneuvers in the common femoral, femoral and popliteal veins. COMPARISON:  None Available. FINDINGS: Contralateral Common Femoral Vein: Respiratory phasicity is normal and symmetric with the symptomatic side. No evidence of thrombus. Normal compressibility. Common Femoral Vein: No evidence of thrombus. Normal compressibility, respiratory phasicity and response to augmentation. Saphenofemoral Junction: No evidence of thrombus. Normal compressibility and  flow on color Doppler imaging. Profunda Femoral Vein: No evidence of thrombus. Normal compressibility and flow on color Doppler imaging. Femoral Vein: No evidence of thrombus. Normal compressibility, respiratory phasicity and response to augmentation. Popliteal Vein: No evidence of thrombus. Normal compressibility, respiratory phasicity and response to augmentation. Calf Veins: No evidence of thrombus. Normal compressibility and flow on color Doppler imaging. Superficial Great Saphenous Vein: No evidence of thrombus. Normal compressibility. Venous Reflux:  None. Other Findings: No evidence of superficial thrombophlebitis or abnormal fluid collection. IMPRESSION: No evidence of left lower extremity deep venous thrombosis. Electronically Signed   By: Marcey Moan M.D.   On: 04/28/2024 16:51   PERIPHERAL VASCULAR CATHETERIZATION Result Date: 04/24/2024 See surgical note for result.  DG C-Arm 1-60 Min-No Report Result Date: 04/20/2024 Fluoroscopy was utilized by the requesting physician.  No radiographic interpretation.   DG Foot Complete Left Result Date: 04/18/2024 Please see detailed radiograph report in office note.   Assessment/Plan  ESRD on hemodialysis (HCC) Steal syndrome symptoms of  the right arm are much better after ligation of her graft.  She has a failing revised fistula in the left upper extremity that can still be used for dialysis but I think this is not going to last long.  She has a Comptroller which she is currently using and this 1 seems to be working well.  We are not going to put any more accesses in her right arm, so at this point she is either going to have to have her left arm used which is failing, or stay PermCath dependent.  Pain of left lower extremity Seeing podiatry.  Normal angiogram.  MRI pending  Hypertension blood pressure control important in reducing the progression of atherosclerotic disease. On appropriate oral medications.    Selinda Gu, MD  05/09/2024 12:06 PM    This note was created with Dragon medical transcription system.  Any errors from dictation are purely unintentional

## 2024-05-09 NOTE — Assessment & Plan Note (Signed)
 Steal syndrome symptoms of the right arm are much better after ligation of her graft.  She has a failing revised fistula in the left upper extremity that can still be used for dialysis but I think this is not going to last long.  She has a Comptroller which she is currently using and this 1 seems to be working well.  We are not going to put any more accesses in her right arm, so at this point she is either going to have to have her left arm used which is failing, or stay PermCath dependent.

## 2024-05-09 NOTE — Assessment & Plan Note (Signed)
 Seeing podiatry.  Normal angiogram.  MRI pending

## 2024-05-09 NOTE — Progress Notes (Signed)
 Chief Complaint  Patient presents with   Foot Pain    Pt is here to f/u on left lower extremity pain, states she f/u with vein and vascular was told nothing was wrong with her circulation and had no clots, was seen in the ER last week due to pain, was given oxycodone  for pain relief, states she took her last pill yesterday. Stating the pain is worse then before, pain shoots throughout  the foot and leg. She has another appointment with vein and vascular today.    Subjective: 63 y.o. female presents today for follow-up evaluation of severe left foot and ankle pain.  She continues to have severe pain even with light touch.  She is unable to walk now because of the pain.  This is an idiopathic onset ongoing for about 4 weeks now.  Past Medical History:  Diagnosis Date   Acute lacunar infarction (HCC) 03/03/2014   a.) MRI brain 03/03/2014: two acute punctate lacunar infarcts anteriorly in the BILATERAL centrum semiovale   Adult behavior problems    Anemia of chronic renal failure    Aortic atherosclerosis    Arthritis    Atrial fibrillation and flutter (HCC)    a.) CHA2DS2VASc = 6 (sex, HFrEF, HTN, CVA x 2, prior MI/vascular disease) as of 02/04/2024; b.) rate/rhythm maintained on oral metoprolol  succinate; chronically anticoagulated with apixaban    Bipolar disorder (HCC)    Cardiomegaly    Cerebral microvascular disease    CHF (congestive heart failure) (HCC)    Chicken pox    Cholelithiasis    Chronic respiratory failure with hypoxia (HCC) 12/19/2021   COPD (chronic obstructive pulmonary disease) (HCC)    Coronary artery disease    DDD (degenerative disc disease), cervical    DDD (degenerative disc disease), thoracolumbar    Diverticulosis    Dyspnea    ESRD (end stage renal disease) on dialysis (M,W,F)    GERD (gastroesophageal reflux disease)    Gout    HCV (hepatitis C virus)    Headache    HFrEF (heart failure with reduced ejection fraction) (HCC)    History of delirium  04/12/2014   History of substance abuse (HCC)    a.) tobacco + cocaine   Hyperkalemia 11/13/2014   Hyperlipidemia    Hypertension    Hypotension 02/24/2014   Hypothyroidism    Ischemic cardiomyopathy    Nose colonized with MRSA 02/01/2024   a.) presurgical PCR (+) on 02/01/2024 prior to A/V FISTULAGRAM; b.) presurgical PCR (+) on 03/07/2024 prior to AV FISTULA CREATION   NSTEMI (non-ST elevated myocardial infarction) (HCC) 10/08/2013   NSTEMI (non-ST elevated myocardial infarction) (HCC) 01/23/2002   a.) LHC 01/24/2002 --> multi-vessel CAD with IRA being 90% OM2 --> delayed PCI until 01/25/2002 --> 3.0 x 23 mm BX Velocity stent   Obesity    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    On apixaban  therapy    Peripheral vascular disease    Pneumonia    Postoperative anemia due to acute blood loss 02/27/2014   Postoperative cerebrovascular infarction following cardiac surgery 02/21/2014   a.) developed RIGHT upper/lower extremity weakness following cardiac revascularization --> neuro consult and head CT --> Hypodensity noted in the area of the LEFT motor cortex consistent with a component of acute ischemia   Pulmonary hypertension (HCC)    RBBB (right bundle branch block)    Renal osteodystrophy    S/P CABG x 3 02/20/2014   a.) LIMA-LAD, SVG-OM1, SVG-PDA   Stroke (HCC) 05/23/2010  a.) brain MRI 05/23/2010: BILATERAL acute infarcts and old prior infarcts; RIGHT frontal lobe extending into the white matter. Small focus of restricted diffusion in the cortex of the medial LEFT frontoparietal lobe. Periventricular T2 and multiple foci of T2 hyperintensity in the subcortical white matter and increased FLAIR signal in the cortex of the LEFT frontal lobe.   Syncope and collapse 07/25/2020    Past Surgical History:  Procedure Laterality Date   A/V FISTULAGRAM Left 03/30/2019   Procedure: A/V FISTULAGRAM;  Surgeon: Marea Selinda RAMAN, MD;  Location: [facility];  Service:  Cardiovascular;  Laterality: Left;   A/V FISTULAGRAM Left 10/06/2019   Procedure: A/V FISTULAGRAM;  Surgeon: Marea Selinda RAMAN, MD;  Location: [facility];  Service: Cardiovascular;  Laterality: Left;   A/V FISTULAGRAM Left 05/19/2021   Procedure: A/V FISTULAGRAM;  Surgeon: Marea Selinda RAMAN, MD;  Location: [facility];  Service: Cardiovascular;  Laterality: Left;   A/V FISTULAGRAM Left 01/29/2022   Procedure: A/V Fistulagram;  Surgeon: Marea Selinda RAMAN, MD;  Location: [facility];  Service: Cardiovascular;  Laterality: Left;   A/V FISTULAGRAM Left 04/30/2022   Procedure: A/V Fistulagram;  Surgeon: Marea Selinda RAMAN, MD;  Location: [facility];  Service: Cardiovascular;  Laterality: Left;   A/V FISTULAGRAM Left 08/20/2022   Procedure: A/V Fistulagram;  Surgeon: Marea Selinda RAMAN, MD;  Location: [facility];  Service: Cardiovascular;  Laterality: Left;   A/V FISTULAGRAM N/A 12/03/2022   Procedure: A/V Fistulagram;  Surgeon: Marea Selinda RAMAN, MD;  Location: [facility];  Service: Cardiovascular;  Laterality: N/A;   A/V FISTULAGRAM Left 02/11/2023   Procedure: A/V Fistulagram;  Surgeon: Marea Selinda RAMAN, MD;  Location: [facility];  Service: Cardiovascular;  Laterality: Left;   A/V FISTULAGRAM Left 02/07/2024   Procedure: A/V Fistulagram;  Surgeon: Marea Selinda RAMAN, MD;  Location: [facility];  Service: Cardiovascular;  Laterality: Left;   COLONOSCOPY     COLONOSCOPY WITH PROPOFOL  N/A 04/22/2021   Procedure: COLONOSCOPY WITH PROPOFOL ;  Surgeon: Maryruth Ole DASEN, MD;  Location: [facility];  Service: Endoscopy;  Laterality: N/A;   CORONARY ANGIOPLASTY WITH STENT PLACEMENT Left 01/25/2002   Procedure: CORONARY ANGIOPLASTY WITH STENT PLACEMENT; Location: [facility]; Surgeon: Margie Lovelace, MD   CORONARY ARTERY BYPASS GRAFT N/A 02/20/2014   Procedure: CORONARY ARTERY BYPASS GRAFT; Location: [facility]; Surgeon: Reyes Fruits, MD   DIALYSIS FISTULA CREATION      DIALYSIS/PERMA CATHETER INSERTION N/A 04/08/2023   Procedure: DIALYSIS/PERMA CATHETER INSERTION;  Surgeon: Marea Selinda RAMAN, MD;  Location: [facility];  Service: Cardiovascular;  Laterality: N/A;   DIALYSIS/PERMA CATHETER REMOVAL N/A 07/06/2023   Procedure: DIALYSIS/PERMA CATHETER REMOVAL;  Surgeon: [name] Cordella MATSU, MD;  Location: [facility];  Service: Cardiovascular;  Laterality: N/A;   ESOPHAGOGASTRODUODENOSCOPY     ESOPHAGOGASTRODUODENOSCOPY N/A 04/22/2021   Procedure: ESOPHAGOGASTRODUODENOSCOPY (EGD);  Surgeon: Maryruth Ole DASEN, MD;  Location: [facility];  Service: Endoscopy;  Laterality: N/A;   FLEXIBLE SIGMOIDOSCOPY N/A 02/23/2019   Procedure: FLEXIBLE SIGMOIDOSCOPY;  Surgeon: Gaylyn Gladis PENNER, MD;  Location: [facility];  Service: Endoscopy;  Laterality: N/A;   IABP INSERTION  02/20/2014   Procedure: INTRA-AORTIC BALLOON PUMP INSERTION; Location: [facility]; Surgeon: Reyes Fruits, MD   INSERTION OF ARTERIOVENOUS (AV) ARTEGRAFT ARM Right 03/09/2024   Procedure: INSERTION, GRAFT, ARTERIOVENOUS, UPPER EXTREMITY;  Surgeon: Marea Selinda RAMAN, MD;  Location: [facility];  Service: Vascular;  Laterality: Right;  BRACHIAL AXILLARY  INSERTION OF DIALYSIS CATHETER N/A 04/20/2024   Procedure: INSERTION OF DIALYSIS CATHETER;  Surgeon: Marea Selinda RAMAN, MD;  Location: [facility];  Service: Vascular;  Laterality: N/A;   LEFT HEART CATH AND CORONARY ANGIOGRAPHY Left 01/24/2002   Procedure: LEFT HEART CATH AND CORONARY ANGIOGRAPHY; Location: [facility]; Surgeon: Margie Lovelace, MD   LEFT HEART CATH AND CORONARY ANGIOGRAPHY Left 05/25/2012   Procedure: LEFT HEART CATH AND CORONARY ANGIOGRAPHY; Location: [facility]; Surgeon: Margie Lovelace, MD   LEFT HEART CATH AND CORONARY ANGIOGRAPHY Left 10/09/2013   Procedure: LEFT HEART CATH AND CORONARY ANGIOGRAPHY; Location: [facility]; Surgeon: Vinie Jude, MD   LEFT HEART CATH AND CORS/GRAFTS ANGIOGRAPHY N/A 08/08/2018   Procedure: LEFT HEART CATH AND CORS/GRAFTS  ANGIOGRAPHY;  Surgeon: Fernand Denyse LABOR, MD;  Location: [facility];  Service: Cardiovascular;  Laterality: N/A;   LEFT HEART CATH AND CORS/GRAFTS ANGIOGRAPHY N/A 01/30/2019   Procedure: LEFT HEART CATH AND CORS/GRAFTS ANGIOGRAPHY;  Surgeon: Fernand Denyse LABOR, MD;  Location: [facility];  Service: Cardiovascular;  Laterality: N/A;   LIGATION ARTERIOVENOUS GORTEX GRAFT Right 04/20/2024   Procedure: LIGATION ARTERIOVENOUS GORTEX GRAFT;  Surgeon: Marea Selinda RAMAN, MD;  Location: [facility];  Service: Vascular;  Laterality: Right;   LOWER EXTREMITY ANGIOGRAPHY Left 04/24/2024   Procedure: Lower Extremity Angiography;  Surgeon: Marea Selinda RAMAN, MD;  Location: [facility];  Service: Cardiovascular;  Laterality: Left;   ultrasound guided pericardiocentesis     UPPER EXTREMITY ANGIOGRAPHY Right 03/30/2024   Procedure: Upper Extremity Angiography;  Surgeon: Marea Selinda RAMAN, MD;  Location: [facility];  Service: Cardiovascular;  Laterality: Right;    Allergies  Allergen Reactions   Morphine  And Codeine Hives   Levaquin [Levofloxacin] Itching and Other (See Comments)    Severe itching; prickly sensation; severe left leg pain; immobility.   Enalapril  Other (See Comments)    hallucinations   Latex Rash   Tape Rash and Other (See Comments)    04/18/2024  Objective: Physical Exam General: The patient is alert and oriented x3 in no acute distress.  Dermatology: Swelling and waxy appearance of the skin noted.  No open wounds.  Vascular: Left foot and ankle is actually warm today compared to the contralateral limb.  Edema noted with moderate erythema Lower extremity angiogram 04/24/2024.  Dr. Selinda Marea [facility] Findings:               Aortogram:  This demonstrated minimal flow in the renal arteries and normal aorta and iliac segments without significant stenosis although they were highly calcific.             Left lower Extremity:  This demonstrated calcific changes throughout the  lower extremity.  Despite the calcific changes, the vessels actually were patent with no significant disease within the common femoral artery, profunda femoris artery, superficial femoral or popliteal arteries.  There was a typical tibial trifurcation and then three-vessel runoff without focal stenosis in the tibial vessels with brisk filling of both the anterior tibial and posterior tibial arteries into the foot  VAS US  ABI WITH/WO TBI (Accession [id number]) (Order [id number]) Vascular Ultrasound Date: 11/12/2022 Department: [facility] Released By: Naomi, [place]  L Authorizing: Delores Orvin BRAVO, NP   ABI Findings:  +---------+------------------+-----+---------+--------+  Right   Rt Pressure (mmHg)IndexWaveform Comment   +---------+------------------+-----+---------+--------+  Brachial 100                                       +---------+------------------+-----+---------+--------+  ATA     121               1.21 triphasic          +---------+------------------+-----+---------+--------+  PTA     115               1.15 triphasic          +---------+------------------+-----+---------+--------+  Great Toe88                0.88 Abnormal           +---------+------------------+-----+---------+--------+   +---------+------------------+-----+--------+-------+  Left    Lt Pressure (mmHg)IndexWaveformComment  +---------+------------------+-----+--------+-------+  Brachial                                AVF      +---------+------------------+-----+--------+-------+  ATA     123               1.23                  +---------+------------------+-----+--------+-------+  PTA     123               1.23                  +---------+------------------+-----+--------+-------+  Great Toe110               1.10 Normal           +---------+------------------+-----+--------+-------+    +-------+-----------+-----------+------------+------------+  ABI/TBIToday's ABIToday's TBIPrevious ABIPrevious TBI  +-------+-----------+-----------+------------+------------+  Right 1.21       .88        1.06        .96           +-------+-----------+-----------+------------+------------+  Left  1.23       1.10       1.09        .79           +-------+-----------+-----------+------------+------------+  Bilateral TBIs and ABIs appear essentially unchanged compared to prior study on 06/22/2022.    Summary:  Right: Resting right ankle-brachial index is within normal range. The right toe-brachial index is normal.  Left: Resting left ankle-brachial index is within normal range. The left toe-brachial index is normal.   Neurological: Light touch and protective threshold diminished  Musculoskeletal Exam: Severe pain and tenderness even with light touch to the left foot and ankle diffusely.  Unable to move the foot and ankle without severe pain  Radiographic Exam LT foot 04/18/2024: Normal osseous mineralization.  No acute fractures identified.  Increased intermetatarsal angle greater than 15 with a hallux abductus angle greater than 30 noted on AP view.   Assessment: 1.  Severe pain LT foot and ankle; idiopathic 2.  Suspicion for possible cellulitis/septic arthritis left foot and ankle   Plan of Care:  -Patient was evaluated.  -Patient continues to have severe foot and ankle pain with warmth compared to contralateral limb today.  Concerning for possible cellulitis/septic arthritis -Today I placed an order for MRI LT ankle wo contrast as well as MRI LT foot wo contrast; STAT to r/o septic arthritis -Due to the severe pain that she is having I did also send in a prescription for Percocet 5/325 mg Q6H #28 -Prescription for doxycycline  100 mg twice daily x 10 days -Return to clinic after MRI to review results and discuss further treatment options  Thresa EMERSON Sar, DPM [facility]  Dr. Thresa EMERSON Sar, DPM    [address] [place], [place] [address]                Office [phone]  Fax [phone]

## 2024-05-10 ENCOUNTER — Ambulatory Visit
Admission: RE | Admit: 2024-05-10 | Discharge: 2024-05-10 | Disposition: A | Discharge status: Home / Self Care | Source: Ambulatory Visit | Attending: Podiatry | Admitting: Podiatry

## 2024-05-10 DIAGNOSIS — L03116 Cellulitis of left lower limb: Secondary | ICD-10-CM

## 2024-05-16 ENCOUNTER — Telehealth: Payer: Self-pay | Admitting: Podiatry

## 2024-05-16 NOTE — Telephone Encounter (Signed)
 Patient called in to get status of MRI results. Please advise.

## 2024-05-17 ENCOUNTER — Telehealth: Payer: Self-pay | Admitting: Lab

## 2024-05-17 NOTE — Telephone Encounter (Signed)
 Patient calling for MRI results states having chronic pain in both feet and needs some advise on what to do next.

## 2024-05-18 ENCOUNTER — Emergency Department
Admission: EM | Admit: 2024-05-18 | Discharge: 2024-05-18 | Disposition: A | Discharge status: Home / Self Care | Attending: Emergency Medicine | Admitting: Emergency Medicine

## 2024-05-18 ENCOUNTER — Other Ambulatory Visit: Payer: Self-pay

## 2024-05-18 DIAGNOSIS — N186 End stage renal disease: Secondary | ICD-10-CM | POA: Insufficient documentation

## 2024-05-18 DIAGNOSIS — Z992 Dependence on renal dialysis: Secondary | ICD-10-CM | POA: Diagnosis not present

## 2024-05-18 DIAGNOSIS — M25572 Pain in left ankle and joints of left foot: Secondary | ICD-10-CM | POA: Insufficient documentation

## 2024-05-18 DIAGNOSIS — I5020 Unspecified systolic (congestive) heart failure: Secondary | ICD-10-CM | POA: Diagnosis not present

## 2024-05-18 DIAGNOSIS — M79672 Pain in left foot: Secondary | ICD-10-CM

## 2024-05-18 MED ORDER — OXYCODONE-ACETAMINOPHEN 5-325 MG PO TABS
1.0000 | ORAL_TABLET | Freq: Once | ORAL | Status: AC
  Administered 2024-05-18: 1 via ORAL
  Filled 2024-05-18: qty 1

## 2024-05-18 NOTE — ED Notes (Signed)
 Pt  refused post op shoe.   Summer pa-c aware.  Pt taken to lobby via wheelchair.  D/c inst to pt and daughter.

## 2024-05-18 NOTE — ED Provider Notes (Signed)
 [facility] Provider Note    Event Date/Time   First MD Initiated Contact with Patient 05/18/24 1420     (approximate)   History   Foot Pain   HPI  [name] is a 63 y.o. female  with a past medical history of ESRD on hemodialysis, paroxysmal atrial fibrillation, HFrEF, atrial flutter, chronic pain syndrome, NSTEMI, CVA, GERD, pulmonary HTN, Hepatitic C presents to the emergency department with acute on chronic left ankle and foot pain and swelling that has been present for several weeks.  She reports the pain is sharp, burning and increased with palpation.  She denies any fall or injury, numbness or weakness in the leg. She reports she last took a Percocet this morning around 10-10:30 AM for this pain.  She has been evaluated for this concern with vascular specialist Dr. Marea and podiatry Dr. Janit. She has not followed up with the podiatrist regarding her MRI results obtained on 05/10/2024 of her left ankle and foot.   I reviewed the medical chart. Angiogram performed by Dr. Marea, vascular specialist, on 04/24/2024 which showed calcific changes in the LLE with patent vessels. DVT study for LE performed in ER 2-3 weeks ago was negative.  MRI ordered by podiatry completed of left ankle and foot on 05/10/2024 that shows ankle subcutaneous edema w/o osteomyelitis, osteochondral lesion of the medial talar dome, mild distal Achilles tendinopathy, tenosynovitis of the tibialis posterior and FDL, and plantar calcaneal spur. MRI foot shows proximal metaphysis of the proximal phalanx fracture.   Physical Exam   Triage Vital Signs: ED Triage Vitals [05/18/24 1401]  Encounter Vitals Group     BP (!) 140/110     Girls Systolic BP Percentile      Girls Diastolic BP Percentile      Boys Systolic BP Percentile      Boys Diastolic BP Percentile      Pulse Rate 82     Resp 18     Temp 98.1 F (36.7 C)     Temp Source Oral     SpO2 98 %     Weight      Height       Head Circumference      Peak Flow      Pain Score 10     Pain Loc      Pain Education      Exclude from Growth Chart     Most recent vital signs: Vitals:   05/18/24 1401 05/18/24 1541  BP: (!) 140/110 (!) 116/97  Pulse: 82 86  Resp: 18 18  Temp: 98.1 F (36.7 C) 98 F (36.7 C)  SpO2: 98% 97%    General: Awake, in no acute distress. Appears stated age. CV: Good peripheral perfusion. DP pulses 2+ b/l. Respiratory:Normal respiratory effort.  No respiratory distress.  GI: Soft, non-distended, non-tender.  MSK: Normal ROM and 5/5 strength in b/l lower extremities. TTP diffusely over the left foot and ankle with light touch. Skin:Warm, dry, intact. B/l LE show skin consistencies with venous stasis. No increased warmth, swelling, erythema, obvious wound, or induration, or area of fluctuance.  Neurological: A&Ox4 to person, place, time, and situation. Sensation intact and equal to b/l lower extremities.  No calf, ankle or foot swelling or asymmetry b/l.  ED Results / Procedures / Treatments   Labs (all labs ordered are listed, but only abnormal results are displayed) Labs Reviewed - No data to display   EKG     RADIOLOGY  PROCEDURES:  Critical Care performed: No   Procedures   MEDICATIONS ORDERED IN ED: Medications  oxyCODONE -acetaminophen  (PERCOCET/ROXICET) 5-325 MG per tablet 1 tablet (1 tablet Oral Given 05/18/24 1521)     IMPRESSION / MDM / ASSESSMENT AND PLAN / ED COURSE  I reviewed the triage vital signs and the nursing notes.                              Differential diagnosis includes, but is not limited to, peripheral neuropathy, proximal phalanx fracture, venous stasis, bone lesion   Patient's presentation is most consistent with exacerbation of chronic illness.  Patient is a 63 year old female here for chronic left foot pain.  Patient's pain seems most consistent with a peripheral neuropathy. She has no swelling, good distal pulses and is  neurovascularly intact. Patient was in severe pain on my evaluation and asked for oxycodone  immediately. I gave her one dose of Percocet here in the emergency department prior to discharge. I did briefly review her MRI results with her from the podiatry's office but told her she needs to have an in-depth discussion for proper follow up with Dr. Janit. Review of chart as discussed above in the HPI shows osteochondral lesion of the medial talar dome and Achilles tendinopathy, tenosynovitis and proximal phalanx proximal metaphysis fracture of the left foot and ankle. Given this is a chronic problem and she has been evaluated recently for the same concern without any new fall or trauma, I did not do any further imaging or workup. I offered a post-op shoe, but patient states she will not wear it and persistently asked the RN for more oxycodone . I do not believe there is an acute, emergent diagnosis today. I will have her follow up with podiatry outpatient as previously scheduled with possible referral to pain management following their evaluation. I did not feel comfortable starting her on gabapentin given I cannot follow up with her on the medication usage. She may use Tylenol  at home for her pain, compression stockings and RICE.  The patient may return to the emergency department for any new, worsening, or concerning symptoms. Patient was given the opportunity to ask questions; all questions were answered. Emergency department return precautions were discussed with the patient.  Patient is in agreement to the treatment plan.  Patient is stable for discharge.    FINAL CLINICAL IMPRESSION(S) / ED DIAGNOSES   Final diagnoses:  Foot pain, left     Rx / DC Orders   ED Discharge Orders     None        Note:  This document was prepared using Dragon voice recognition software and may include unintentional dictation errors.     Sheron Kinloch, PA-C 05/18/24 1813    Bradler, Evan K, MD 05/19/24  [phone]

## 2024-05-18 NOTE — ED Triage Notes (Signed)
 Patient states left foot pain; had MRI on 05/10/2024 but does not have results.

## 2024-05-18 NOTE — Discharge Instructions (Addendum)
 You were seen in the emergency department for left foot pain.  Your MRI from 11/26 shows inflammation of one of the tendons in your ankle, a lesion of one of the bones in your foot and a fracture of one of your toes. Follow up with your podiatrist (foot doctor) outpatient, Dr. Janit, who can go over your MRI results in depth. Apply ice where it hurts. Give their office a call to schedule an appointment. Continue your regular pain regimen at home. You may take Tylenol  500 mg (1 pill) every 6 hours for pain. Wear the boot to help with your pain. Return to the ER for any new, worsening, or concerning symptoms.

## 2024-05-18 NOTE — ED Notes (Signed)
 Pt requesting pain meds for left foot pain, reports last oxy at 1030. Summer PA notified.

## 2024-05-19 ENCOUNTER — Encounter: Payer: Self-pay | Admitting: Podiatry

## 2024-05-19 ENCOUNTER — Ambulatory Visit: Admitting: Podiatry

## 2024-05-19 VITALS — Wt 166.0 lb

## 2024-05-19 DIAGNOSIS — I70222 Atherosclerosis of native arteries of extremities with rest pain, left leg: Secondary | ICD-10-CM

## 2024-05-19 MED ORDER — OXYCODONE-ACETAMINOPHEN 5-325 MG PO TABS: 1.0000 | ORAL_TABLET | Freq: Four times a day (QID) | ORAL | 0 refills | Status: DC | PRN

## 2024-05-19 NOTE — Progress Notes (Signed)
 Chief Complaint  Patient presents with   Foot Pain    Pt is here to f/u on left foot/ankle due to pain, states she was in the ER yesterday for the same reason, still having pain, wants to discuss recent MRI results.    Subjective: 63 y.o. female presents today for follow-up evaluation of severe pain to the left lower extremity.  The pain has progressed and she actually went to the emergency department yesterday.  Past Medical History:  Diagnosis Date   Acute lacunar infarction (HCC) 03/03/2014   a.) MRI brain 03/03/2014: two acute punctate lacunar infarcts anteriorly in the BILATERAL centrum semiovale   Adult behavior problems    Anemia of chronic renal failure    Aortic atherosclerosis    Arthritis    Atrial fibrillation and flutter (HCC)    a.) CHA2DS2VASc = 6 (sex, HFrEF, HTN, CVA x 2, prior MI/vascular disease) as of 02/04/2024; b.) rate/rhythm maintained on oral metoprolol  succinate; chronically anticoagulated with apixaban    Bipolar disorder (HCC)    Cardiomegaly    Cerebral microvascular disease    CHF (congestive heart failure) (HCC)    Chicken pox    Cholelithiasis    Chronic respiratory failure with hypoxia (HCC) 12/19/2021   COPD (chronic obstructive pulmonary disease) (HCC)    Coronary artery disease    DDD (degenerative disc disease), cervical    DDD (degenerative disc disease), thoracolumbar    Diverticulosis    Dyspnea    ESRD (end stage renal disease) on dialysis (M,W,F)    GERD (gastroesophageal reflux disease)    Gout    HCV (hepatitis C virus)    Headache    HFrEF (heart failure with reduced ejection fraction) (HCC)    History of delirium 04/12/2014   History of substance abuse (HCC)    a.) tobacco + cocaine   Hyperkalemia 11/13/2014   Hyperlipidemia    Hypertension    Hypotension 02/24/2014   Hypothyroidism    Ischemic cardiomyopathy    Nose colonized with MRSA 02/01/2024   a.) presurgical PCR (+) on 02/01/2024 prior to A/V FISTULAGRAM; b.)  presurgical PCR (+) on 03/07/2024 prior to AV FISTULA CREATION   NSTEMI (non-ST elevated myocardial infarction) (HCC) 10/08/2013   NSTEMI (non-ST elevated myocardial infarction) (HCC) 01/23/2002   a.) LHC 01/24/2002 --> multi-vessel CAD with IRA being 90% OM2 --> delayed PCI until 01/25/2002 --> 3.0 x 23 mm BX Velocity stent   Obesity    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    On apixaban  therapy    Peripheral vascular disease    Pneumonia    Postoperative anemia due to acute blood loss 02/27/2014   Postoperative cerebrovascular infarction following cardiac surgery 02/21/2014   a.) developed RIGHT upper/lower extremity weakness following cardiac revascularization --> neuro consult and head CT --> Hypodensity noted in the area of the LEFT motor cortex consistent with a component of acute ischemia   Pulmonary hypertension (HCC)    RBBB (right bundle branch block)    Renal osteodystrophy    S/P CABG x 3 02/20/2014   a.) LIMA-LAD, SVG-OM1, SVG-PDA   Stroke (HCC) 05/23/2010   a.) brain MRI 05/23/2010: BILATERAL acute infarcts and old prior infarcts; RIGHT frontal lobe extending into the white matter. Small focus of restricted diffusion in the cortex of the medial LEFT frontoparietal lobe. Periventricular T2 and multiple foci of T2 hyperintensity in the subcortical white matter and increased FLAIR signal in the cortex of the LEFT frontal lobe.   Syncope and  collapse 07/25/2020    Past Surgical History:  Procedure Laterality Date   A/V FISTULAGRAM Left 03/30/2019   Procedure: A/V FISTULAGRAM;  Surgeon: Marea Selinda RAMAN, MD;  Location: [facility];  Service: Cardiovascular;  Laterality: Left;   A/V FISTULAGRAM Left 10/06/2019   Procedure: A/V FISTULAGRAM;  Surgeon: Marea Selinda RAMAN, MD;  Location: [facility];  Service: Cardiovascular;  Laterality: Left;   A/V FISTULAGRAM Left 05/19/2021   Procedure: A/V FISTULAGRAM;  Surgeon: Marea Selinda RAMAN, MD;  Location: [facility];  Service: Cardiovascular;  Laterality: Left;   A/V FISTULAGRAM Left 01/29/2022   Procedure: A/V Fistulagram;  Surgeon: Marea Selinda RAMAN, MD;  Location: [facility];  Service: Cardiovascular;  Laterality: Left;   A/V FISTULAGRAM Left 04/30/2022   Procedure: A/V Fistulagram;  Surgeon: Marea Selinda RAMAN, MD;  Location: [facility];  Service: Cardiovascular;  Laterality: Left;   A/V FISTULAGRAM Left 08/20/2022   Procedure: A/V Fistulagram;  Surgeon: Marea Selinda RAMAN, MD;  Location: [facility];  Service: Cardiovascular;  Laterality: Left;   A/V FISTULAGRAM N/A 12/03/2022   Procedure: A/V Fistulagram;  Surgeon: Marea Selinda RAMAN, MD;  Location: [facility];  Service: Cardiovascular;  Laterality: N/A;   A/V FISTULAGRAM Left 02/11/2023   Procedure: A/V Fistulagram;  Surgeon: Marea Selinda RAMAN, MD;  Location: [facility];  Service: Cardiovascular;  Laterality: Left;   A/V FISTULAGRAM Left 02/07/2024   Procedure: A/V Fistulagram;  Surgeon: Marea Selinda RAMAN, MD;  Location: [facility];  Service: Cardiovascular;  Laterality: Left;   COLONOSCOPY     COLONOSCOPY WITH PROPOFOL  N/A 04/22/2021   Procedure: COLONOSCOPY WITH PROPOFOL ;  Surgeon: Maryruth Ole DASEN, MD;  Location: [facility];  Service: Endoscopy;  Laterality: N/A;   CORONARY ANGIOPLASTY WITH STENT PLACEMENT Left 01/25/2002   Procedure: CORONARY ANGIOPLASTY WITH STENT PLACEMENT; Location: [facility]; Surgeon: Margie Lovelace, MD   CORONARY ARTERY BYPASS GRAFT N/A 02/20/2014   Procedure: CORONARY ARTERY BYPASS GRAFT; Location: [facility]; Surgeon: Reyes Fruits, MD   DIALYSIS FISTULA CREATION     DIALYSIS/PERMA CATHETER INSERTION N/A 04/08/2023   Procedure: DIALYSIS/PERMA CATHETER INSERTION;  Surgeon: Marea Selinda RAMAN, MD;  Location: [facility];  Service: Cardiovascular;  Laterality: N/A;   DIALYSIS/PERMA CATHETER REMOVAL N/A 07/06/2023   Procedure: DIALYSIS/PERMA CATHETER REMOVAL;  Surgeon: [name] Cordella MATSU, MD;   Location: [facility];  Service: Cardiovascular;  Laterality: N/A;   ESOPHAGOGASTRODUODENOSCOPY     ESOPHAGOGASTRODUODENOSCOPY N/A 04/22/2021   Procedure: ESOPHAGOGASTRODUODENOSCOPY (EGD);  Surgeon: Maryruth Ole DASEN, MD;  Location: [facility];  Service: Endoscopy;  Laterality: N/A;   FLEXIBLE SIGMOIDOSCOPY N/A 02/23/2019   Procedure: FLEXIBLE SIGMOIDOSCOPY;  Surgeon: Gaylyn Gladis PENNER, MD;  Location: [facility] ENDOSCOPY;  Service: Endoscopy;  Laterality: N/A;   IABP INSERTION  02/20/2014   Procedure: INTRA-AORTIC BALLOON PUMP INSERTION; Location: [facility]; Surgeon: Reyes Fruits, MD   INSERTION OF ARTERIOVENOUS (AV) ARTEGRAFT ARM Right 03/09/2024   Procedure: INSERTION, GRAFT, ARTERIOVENOUS, UPPER EXTREMITY;  Surgeon: Marea Selinda RAMAN, MD;  Location: [facility];  Service: Vascular;  Laterality: Right;  BRACHIAL AXILLARY   INSERTION OF DIALYSIS CATHETER N/A 04/20/2024   Procedure: INSERTION OF DIALYSIS CATHETER;  Surgeon: Marea Selinda RAMAN, MD;  Location: [facility];  Service: Vascular;  Laterality: N/A;   LEFT HEART CATH AND CORONARY ANGIOGRAPHY Left 01/24/2002   Procedure: LEFT HEART CATH AND CORONARY ANGIOGRAPHY; Location: [facility]; Surgeon: Margie Lovelace, MD   LEFT HEART CATH AND CORONARY ANGIOGRAPHY  Left 05/25/2012   Procedure: LEFT HEART CATH AND CORONARY ANGIOGRAPHY; Location: [facility]; Surgeon: Margie Lovelace, MD   LEFT HEART CATH AND CORONARY ANGIOGRAPHY Left 10/09/2013   Procedure: LEFT HEART CATH AND CORONARY ANGIOGRAPHY; Location: [facility]; Surgeon: Vinie Jude, MD   LEFT HEART CATH AND CORS/GRAFTS ANGIOGRAPHY N/A 08/08/2018   Procedure: LEFT HEART CATH AND CORS/GRAFTS ANGIOGRAPHY;  Surgeon: Fernand Denyse LABOR, MD;  Location: [facility];  Service: Cardiovascular;  Laterality: N/A;   LEFT HEART CATH AND CORS/GRAFTS ANGIOGRAPHY N/A 01/30/2019   Procedure: LEFT HEART CATH AND CORS/GRAFTS ANGIOGRAPHY;  Surgeon: Fernand Denyse LABOR, MD;  Location: [facility];  Service: Cardiovascular;   Laterality: N/A;   LIGATION ARTERIOVENOUS GORTEX GRAFT Right 04/20/2024   Procedure: LIGATION ARTERIOVENOUS GORTEX GRAFT;  Surgeon: Marea Selinda RAMAN, MD;  Location: [facility];  Service: Vascular;  Laterality: Right;   LOWER EXTREMITY ANGIOGRAPHY Left 04/24/2024   Procedure: Lower Extremity Angiography;  Surgeon: Marea Selinda RAMAN, MD;  Location: [facility];  Service: Cardiovascular;  Laterality: Left;   ultrasound guided pericardiocentesis     UPPER EXTREMITY ANGIOGRAPHY Right 03/30/2024   Procedure: Upper Extremity Angiography;  Surgeon: Marea Selinda RAMAN, MD;  Location: [facility];  Service: Cardiovascular;  Laterality: Right;    Allergies  Allergen Reactions   Morphine  And Codeine Hives   Levaquin [Levofloxacin] Itching and Other (See Comments)    Severe itching; prickly sensation; severe left leg pain; immobility.   Enalapril  Other (See Comments)    hallucinations   Latex Rash   Tape Rash and Other (See Comments)    04/18/2024  Focused exam:  Skin is waxy and shiny with peeling noted to the foot.  Increased swelling with increased discoloration to the left lower extremity compared to the contralateral limb extending proximal to the level of the knee.  Severe pain with light touch especially around the posterior ankle and calf.  The calf is very hard and distended.  Concern for progressive critical limb ischemia  Vascular: Left foot and ankle is actually warm today compared to the contralateral limb.  Edema noted with moderate erythema Lower extremity angiogram 04/24/2024.  Dr. Selinda [facility] Findings:               Aortogram:  This demonstrated minimal flow in the renal arteries and normal aorta and iliac segments without significant stenosis although they were highly calcific.             Left lower Extremity:  This demonstrated calcific changes throughout the lower extremity.  Despite the calcific changes, the vessels actually were patent with no significant disease within  the common femoral artery, profunda femoris artery, superficial femoral or popliteal arteries.  There was a typical tibial trifurcation and then three-vessel runoff without focal stenosis in the tibial vessels with brisk filling of both the anterior tibial and posterior tibial arteries into the foot  VAS US  ABI WITH/WO TBI (Accession [id number]) (Order [id number]) Vascular Ultrasound Date: 11/12/2022 Department: [facility] Released By: Naomi, [place]  L Authorizing: Delores Orvin BRAVO, NP   ABI Findings:  +---------+------------------+-----+---------+--------+  Right   Rt Pressure (mmHg)IndexWaveform Comment   +---------+------------------+-----+---------+--------+  Brachial 100                                       +---------+------------------+-----+---------+--------+  ATA     121  1.21 triphasic          +---------+------------------+-----+---------+--------+  PTA     115               1.15 triphasic          +---------+------------------+-----+---------+--------+  Great Toe88                0.88 Abnormal           +---------+------------------+-----+---------+--------+   +---------+------------------+-----+--------+-------+  Left    Lt Pressure (mmHg)IndexWaveformComment  +---------+------------------+-----+--------+-------+  Brachial                                AVF      +---------+------------------+-----+--------+-------+  ATA     123               1.23                  +---------+------------------+-----+--------+-------+  PTA     123               1.23                  +---------+------------------+-----+--------+-------+  Great Toe110               1.10 Normal           +---------+------------------+-----+--------+-------+   +-------+-----------+-----------+------------+------------+  ABI/TBIToday's ABIToday's TBIPrevious ABIPrevious TBI   +-------+-----------+-----------+------------+------------+  Right 1.21       .88        1.06        .96           +-------+-----------+-----------+------------+------------+  Left  1.23       1.10       1.09        .79           +-------+-----------+-----------+------------+------------+  Bilateral TBIs and ABIs appear essentially unchanged compared to prior study on 06/22/2022.  Summary:  Right: Resting right ankle-brachial index is within normal range. The right toe-brachial index is normal.  Left: Resting left ankle-brachial index is within normal range. The left toe-brachial index is normal.   Radiographic Exam LT foot 04/18/2024: Normal osseous mineralization.  No acute fractures identified.  Increased intermetatarsal angle greater than 15 with a hallux abductus angle greater than 30 noted on AP view.   MR FOOT LEFT WO CONTRAST (Accession [id number]) (Order [id number]) Imaging Date: 05/10/2024 Department: [facility] Released By: Jakie Milling Authorizing: Janit Thresa HERO, DPM  IMPRESSION: 1. Fracture of the proximal metaphysis of the proximal phalanx extending to the proximal articular surface. 2. No MRI evidence of osteomyelitis in the forefoot. 3. Dorsal forefoot subcutaneous edema without abscess or drainable fluid collection; cellulitis not excluded.  MR ANKLE LEFT WO CONTRAST (Accession [id number]) (Order [id number]) Imaging Date: 05/10/2024 Department: [facility] Released By: Jakie Milling Authorizing: Janit Thresa HERO, DPM  IMPRESSION: 1. Subcutaneous edema about the ankle extending into the dorsum of the foot without evidence of hindfoot osteomyelitis. 2. 1.5 x 0.7 cm nonfragmented osteochondral lesion of the medial talar dome with overlying prominent chondral thinning. 3. Mild tibialis posterior and flexor digitorum longus tenosynovitis. 4. Mild distal Achilles tendinopathy. 5. Plantar calcaneal spur.  Assessment: 1.  Severe pain  LT foot and leg concerning for persistent critical limb ischemia   Plan of Care:  -Patient was evaluated.  Imaging reviewed today -The findings on MRI do not correlate with the patient's symptoms  and pain that she is experiencing.  Very concerning for critical limb ischemia especially with the increased discoloration and distention of the leg and waxy skin appearance.  Severe pain with light touch.  It is now extended proximal to the level of the knee.  Due to the progressive nature of her symptoms concerning for possible limb loss -Reached back out to vascular today for possible reevaluation.  Hopefully they can get her in soon to reevaluate and reassess.  There may be nothing additional they can offer and possibly due to small vessel disease -Due to the severe pain refill prescription for Percocet 5/325 mg Q6H #28 -no f/u with me currently  Thresa EMERSON Sar, DPM [facility]  Dr. Thresa EMERSON Sar, DPM    [address] [place], [place] [address]                Office [phone]  Fax [phone]

## 2024-05-23 ENCOUNTER — Encounter: Payer: Self-pay | Admitting: Internal Medicine

## 2024-05-23 ENCOUNTER — Ambulatory Visit: Admitting: Internal Medicine

## 2024-05-23 VITALS — BP 124/76 | Ht 66.0 in | Wt 165.4 lb

## 2024-05-23 DIAGNOSIS — N186 End stage renal disease: Secondary | ICD-10-CM

## 2024-05-23 DIAGNOSIS — M79661 Pain in right lower leg: Secondary | ICD-10-CM

## 2024-05-23 DIAGNOSIS — I872 Venous insufficiency (chronic) (peripheral): Secondary | ICD-10-CM

## 2024-05-23 DIAGNOSIS — G894 Chronic pain syndrome: Secondary | ICD-10-CM

## 2024-05-23 DIAGNOSIS — L03115 Cellulitis of right lower limb: Secondary | ICD-10-CM

## 2024-05-23 DIAGNOSIS — I5020 Unspecified systolic (congestive) heart failure: Secondary | ICD-10-CM

## 2024-05-23 DIAGNOSIS — L03116 Cellulitis of left lower limb: Secondary | ICD-10-CM

## 2024-05-23 DIAGNOSIS — E785 Hyperlipidemia, unspecified: Secondary | ICD-10-CM

## 2024-05-23 MED ORDER — OXYCODONE-ACETAMINOPHEN 5-325 MG PO TABS: 1.0000 | ORAL_TABLET | Freq: Two times a day (BID) | ORAL | 0 refills | Status: DC | PRN

## 2024-05-23 MED ORDER — CEPHALEXIN 500 MG PO CAPS: 500.0000 mg | ORAL_CAPSULE | Freq: Two times a day (BID) | ORAL | 0 refills | Status: DC

## 2024-05-23 NOTE — Progress Notes (Signed)
 Established Patient Office Visit  Subjective:  Patient ID: [name], female    DOB: Jan 12, 1961  Age: 63 y.o. MRN: [id number]  Chief Complaint  Patient presents with   Follow-up    Discuss referral    Patient comes in with increased pain and swelling in her both lower extremities.  Patient reports that this has been going on for some time and has already been treated for bilateral cellulitis.  Initially the pain was mostly located in her left lower leg, and was evaluated by the vascular surgeon as well as the podiatrist.  Her left lower extremity angiogram showed calcifications but patent arteries.  Patient has already been treated with antibiotics previously, but it did not show any improvement.  There is some clear fluid oozing out from above her left ankle.  There is redness and increased warmth in both her legs along with stasis dermatitis and cellulitis.   Patient is still concerned about possible ischemia of her legs and requests a second opinion at [facility] for a vascular surgeon.  She reports that her pain and swelling has gotten worse along with the reddish discoloration of her lower legs.  Will set up referral at [facility]. Will also send a prescription for p.o. Keflex  which she will take following her dialysis schedule. Patient also advised to try wearing compression stockings    No other concerns at this time.   Past Medical History:  Diagnosis Date   Acute lacunar infarction (HCC) 03/03/2014   a.) MRI brain 03/03/2014: two acute punctate lacunar infarcts anteriorly in the BILATERAL centrum semiovale   Adult behavior problems    Anemia of chronic renal failure    Aortic atherosclerosis    Arthritis    Atrial fibrillation and flutter (HCC)    a.) CHA2DS2VASc = 6 (sex, HFrEF, HTN, CVA x 2, prior MI/vascular disease) as of 02/04/2024; b.) rate/rhythm maintained on oral metoprolol  succinate; chronically anticoagulated with apixaban    Bipolar disorder (HCC)    Cardiomegaly     Cerebral microvascular disease    CHF (congestive heart failure) (HCC)    Chicken pox    Cholelithiasis    Chronic respiratory failure with hypoxia (HCC) 12/19/2021   COPD (chronic obstructive pulmonary disease) (HCC)    Coronary artery disease    DDD (degenerative disc disease), cervical    DDD (degenerative disc disease), thoracolumbar    Diverticulosis    Dyspnea    ESRD (end stage renal disease) on dialysis (M,W,F)    GERD (gastroesophageal reflux disease)    Gout    HCV (hepatitis C virus)    Headache    HFrEF (heart failure with reduced ejection fraction) (HCC)    History of delirium 04/12/2014   History of substance abuse (HCC)    a.) tobacco + cocaine   Hyperkalemia 11/13/2014   Hyperlipidemia    Hypertension    Hypotension 02/24/2014   Hypothyroidism    Ischemic cardiomyopathy    Nose colonized with MRSA 02/01/2024   a.) presurgical PCR (+) on 02/01/2024 prior to A/V FISTULAGRAM; b.) presurgical PCR (+) on 03/07/2024 prior to AV FISTULA CREATION   NSTEMI (non-ST elevated myocardial infarction) (HCC) 10/08/2013   NSTEMI (non-ST elevated myocardial infarction) (HCC) 01/23/2002   a.) LHC 01/24/2002 --> multi-vessel CAD with IRA being 90% OM2 --> delayed PCI until 01/25/2002 --> 3.0 x 23 mm BX Velocity stent   Obesity    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    On apixaban  therapy    Peripheral  vascular disease    Pneumonia    Postoperative anemia due to acute blood loss 02/27/2014   Postoperative cerebrovascular infarction following cardiac surgery 02/21/2014   a.) developed RIGHT upper/lower extremity weakness following cardiac revascularization --> neuro consult and head CT --> Hypodensity noted in the area of the LEFT motor cortex consistent with a component of acute ischemia   Pulmonary hypertension (HCC)    RBBB (right bundle branch block)    Renal osteodystrophy    S/P CABG x 3 02/20/2014   a.) LIMA-LAD, SVG-OM1, SVG-PDA   Stroke (HCC) 05/23/2010    a.) brain MRI 05/23/2010: BILATERAL acute infarcts and old prior infarcts; RIGHT frontal lobe extending into the white matter. Small focus of restricted diffusion in the cortex of the medial LEFT frontoparietal lobe. Periventricular T2 and multiple foci of T2 hyperintensity in the subcortical white matter and increased FLAIR signal in the cortex of the LEFT frontal lobe.   Syncope and collapse 07/25/2020    Past Surgical History:  Procedure Laterality Date   A/V FISTULAGRAM Left 03/30/2019   Procedure: A/V FISTULAGRAM;  Surgeon: Marea Selinda RAMAN, MD;  Location: [facility];  Service: Cardiovascular;  Laterality: Left;   A/V FISTULAGRAM Left 10/06/2019   Procedure: A/V FISTULAGRAM;  Surgeon: Marea Selinda RAMAN, MD;  Location: [facility];  Service: Cardiovascular;  Laterality: Left;   A/V FISTULAGRAM Left 05/19/2021   Procedure: A/V FISTULAGRAM;  Surgeon: Marea Selinda RAMAN, MD;  Location: [facility];  Service: Cardiovascular;  Laterality: Left;   A/V FISTULAGRAM Left 01/29/2022   Procedure: A/V Fistulagram;  Surgeon: Marea Selinda RAMAN, MD;  Location: [facility];  Service: Cardiovascular;  Laterality: Left;   A/V FISTULAGRAM Left 04/30/2022   Procedure: A/V Fistulagram;  Surgeon: Marea Selinda RAMAN, MD;  Location: [facility];  Service: Cardiovascular;  Laterality: Left;   A/V FISTULAGRAM Left 08/20/2022   Procedure: A/V Fistulagram;  Surgeon: Marea Selinda RAMAN, MD;  Location: [facility];  Service: Cardiovascular;  Laterality: Left;   A/V FISTULAGRAM N/A 12/03/2022   Procedure: A/V Fistulagram;  Surgeon: Marea Selinda RAMAN, MD;  Location: [facility];  Service: Cardiovascular;  Laterality: N/A;   A/V FISTULAGRAM Left 02/11/2023   Procedure: A/V Fistulagram;  Surgeon: Marea Selinda RAMAN, MD;  Location: [facility];  Service: Cardiovascular;  Laterality: Left;   A/V FISTULAGRAM Left 02/07/2024   Procedure: A/V Fistulagram;  Surgeon: Marea Selinda RAMAN, MD;  Location: [facility];  Service: Cardiovascular;  Laterality: Left;   COLONOSCOPY     COLONOSCOPY WITH PROPOFOL  N/A 04/22/2021   Procedure: COLONOSCOPY WITH PROPOFOL ;  Surgeon: Maryruth Ole DASEN, MD;  Location: [facility];  Service: Endoscopy;  Laterality: N/A;   CORONARY ANGIOPLASTY WITH STENT PLACEMENT Left 01/25/2002   Procedure: CORONARY ANGIOPLASTY WITH STENT PLACEMENT; Location: [facility]; Surgeon: Margie Lovelace, MD   CORONARY ARTERY BYPASS GRAFT N/A 02/20/2014   Procedure: CORONARY ARTERY BYPASS GRAFT; Location: [facility]; Surgeon: Reyes Fruits, MD   DIALYSIS FISTULA CREATION     DIALYSIS/PERMA CATHETER INSERTION N/A 04/08/2023   Procedure: DIALYSIS/PERMA CATHETER INSERTION;  Surgeon: Marea Selinda RAMAN, MD;  Location: [facility];  Service: Cardiovascular;  Laterality: N/A;   DIALYSIS/PERMA CATHETER REMOVAL N/A 07/06/2023   Procedure: DIALYSIS/PERMA CATHETER REMOVAL;  Surgeon: [name] Cordella MATSU, MD;  Location: [facility];  Service: Cardiovascular;  Laterality: N/A;   ESOPHAGOGASTRODUODENOSCOPY     ESOPHAGOGASTRODUODENOSCOPY N/A 04/22/2021   Procedure: ESOPHAGOGASTRODUODENOSCOPY (EGD);  Surgeon: Maryruth Ole DASEN, MD;  Location: [facility] ENDOSCOPY;  Service: Endoscopy;  Laterality: N/A;   FLEXIBLE SIGMOIDOSCOPY N/A 02/23/2019   Procedure: FLEXIBLE SIGMOIDOSCOPY;  Surgeon: Gaylyn Gladis PENNER, MD;  Location: [facility] ENDOSCOPY;  Service: Endoscopy;  Laterality: N/A;   IABP INSERTION  02/20/2014   Procedure: INTRA-AORTIC BALLOON PUMP INSERTION; Location: [facility]; Surgeon: Reyes Fruits, MD   INSERTION OF ARTERIOVENOUS (AV) ARTEGRAFT ARM Right 03/09/2024   Procedure: INSERTION, GRAFT, ARTERIOVENOUS, UPPER EXTREMITY;  Surgeon: Marea Selinda RAMAN, MD;  Location: [facility];  Service: Vascular;  Laterality: Right;  BRACHIAL AXILLARY   INSERTION OF DIALYSIS CATHETER N/A 04/20/2024   Procedure: INSERTION OF DIALYSIS CATHETER;  Surgeon: Marea Selinda RAMAN, MD;  Location: [facility];  Service: Vascular;  Laterality:  N/A;   LEFT HEART CATH AND CORONARY ANGIOGRAPHY Left 01/24/2002   Procedure: LEFT HEART CATH AND CORONARY ANGIOGRAPHY; Location: [facility]; Surgeon: Margie Lovelace, MD   LEFT HEART CATH AND CORONARY ANGIOGRAPHY Left 05/25/2012   Procedure: LEFT HEART CATH AND CORONARY ANGIOGRAPHY; Location: [facility]; Surgeon: Margie Lovelace, MD   LEFT HEART CATH AND CORONARY ANGIOGRAPHY Left 10/09/2013   Procedure: LEFT HEART CATH AND CORONARY ANGIOGRAPHY; Location: [facility]; Surgeon: Vinie Jude, MD   LEFT HEART CATH AND CORS/GRAFTS ANGIOGRAPHY N/A 08/08/2018   Procedure: LEFT HEART CATH AND CORS/GRAFTS ANGIOGRAPHY;  Surgeon: Fernand Denyse LABOR, MD;  Location: [facility];  Service: Cardiovascular;  Laterality: N/A;   LEFT HEART CATH AND CORS/GRAFTS ANGIOGRAPHY N/A 01/30/2019   Procedure: LEFT HEART CATH AND CORS/GRAFTS ANGIOGRAPHY;  Surgeon: Fernand Denyse LABOR, MD;  Location: [facility];  Service: Cardiovascular;  Laterality: N/A;   LIGATION ARTERIOVENOUS GORTEX GRAFT Right 04/20/2024   Procedure: LIGATION ARTERIOVENOUS GORTEX GRAFT;  Surgeon: Marea Selinda RAMAN, MD;  Location: [facility];  Service: Vascular;  Laterality: Right;   LOWER EXTREMITY ANGIOGRAPHY Left 04/24/2024   Procedure: Lower Extremity Angiography;  Surgeon: Marea Selinda RAMAN, MD;  Location: [facility];  Service: Cardiovascular;  Laterality: Left;   ultrasound guided pericardiocentesis     UPPER EXTREMITY ANGIOGRAPHY Right 03/30/2024   Procedure: Upper Extremity Angiography;  Surgeon: Marea Selinda RAMAN, MD;  Location: [facility];  Service: Cardiovascular;  Laterality: Right;    Social History   Socioeconomic History   Marital status: Single    Spouse name: Not on file   Number of children: Not on file   Years of education: Not on file   Highest education level: Not on file  Occupational History   Not on file  Tobacco Use   Smoking status: Every Day    Current packs/day: 0.25    Average packs/day: 0.3 packs/day for 20.0 years  (5.0 ttl pk-yrs)    Types: Cigarettes   Smokeless tobacco: Never   Tobacco comments:    smokes 1 - 1.5 cigarettes a day  Vaping Use   Vaping status: Never Used  Substance and Sexual Activity   Alcohol use: No    Comment: social   Drug use: No   Sexual activity: Not Currently    Birth control/protection: Post-menopausal  Other Topics Concern   Not on file  Social History Narrative   Lives alone   Social Drivers of Health   Financial Resource Strain: Not on file  Food Insecurity: No Food Insecurity (05/18/2023)   Hunger Vital Sign    Worried About Running Out of Food in the Last Year: Never true    Ran Out of Food in the Last Year: Never true  Transportation  Needs: No Transportation Needs (05/18/2023)   PRAPARE - Administrator, Civil Service (Medical): No    Lack of Transportation (Non-Medical): No  Physical Activity: Not on file  Stress: Not on file  Social Connections: Not on file  Intimate Partner Violence: Not At Risk (05/18/2023)   Humiliation, Afraid, Rape, and Kick questionnaire    Fear of Current or Ex-Partner: No    Emotionally Abused: No    Physically Abused: No    Sexually Abused: No    Family History  Problem Relation Age of Onset   Hypertension Mother    Ovarian cancer Mother    Diabetes type II Mother    Hypertension Father    Kidney disease Father    Hypertension Sister    Diabetes type II Maternal Grandmother    Breast cancer Maternal Grandmother    Breast cancer Maternal Aunt    Hypertension Other    Cancer Other    Renal Disease Other     Allergies  Allergen Reactions   Morphine  And Codeine Hives   Levaquin [Levofloxacin] Itching and Other (See Comments)    Severe itching; prickly sensation; severe left leg pain; immobility.   Enalapril  Other (See Comments)    hallucinations   Latex Rash   Tape Rash and Other (See Comments)    Outpatient Medications Prior to Visit  Medication Sig   albuterol  (VENTOLIN  HFA) 108 (90 Base)  MCG/ACT inhaler Inhale 1-2 puffs into the lungs every 6 (six) hours as needed for wheezing or shortness of breath.   apixaban  (ELIQUIS ) 5 MG TABS tablet Take 5 mg by mouth 2 (two) times daily.   hydrOXYzine  (ATARAX /VISTARIL ) 50 MG tablet Take 50 mg by mouth at bedtime.   levothyroxine  (SYNTHROID ) 75 MCG tablet Take 75 mcg by mouth daily before breakfast.    lidocaine -prilocaine  (EMLA ) cream Apply 1 Application topically once.   metoprolol  succinate (TOPROL -XL) 25 MG 24 hr tablet Take 1 tablet by mouth every morning.   midodrine  (PROAMATINE ) 10 MG tablet Take 1 tablet (10 mg total) by mouth 3 (three) times daily.   pantoprazole  (PROTONIX ) 40 MG tablet Take 40 mg by mouth every morning.   ranolazine  (RANEXA ) 1000 MG SR tablet Take 1,000 mg by mouth 2 (two) times daily.   rosuvastatin  (CRESTOR ) 20 MG tablet Take 20 mg by mouth at bedtime.   sevelamer  carbonate (RENVELA ) 800 MG tablet Take 3,200 mg by mouth 3 (three) times daily with meals.   triamcinolone  cream (KENALOG ) 0.1 % Apply 1 Application topically 2 (two) times daily.   [DISCONTINUED] oxyCODONE -acetaminophen  (PERCOCET) 5-325 MG tablet Take 1 tablet by mouth every 6 (six) hours as needed for severe pain (pain score 7-10).   colchicine  0.6 MG tablet Take 1 tablet (0.6 mg total) by mouth once for 1 dose. (Patient not taking: Reported on 05/23/2024)   doxycycline  (VIBRA -TABS) 100 MG tablet Take 1 tablet (100 mg total) by mouth 2 (two) times daily. (Patient not taking: Reported on 05/23/2024)   Facility-Administered Medications Prior to Visit  Medication Dose Route Frequency Provider   heparin  injection    PRN Marea Selinda RAMAN, MD    Review of Systems  Constitutional: Negative.  Negative for chills, fever and malaise/fatigue.  HENT: Negative.  Negative for congestion and sore throat.   Eyes: Negative.  Negative for blurred vision and pain.  Respiratory: Negative.  Negative for cough and shortness of breath.   Cardiovascular: Negative.  Negative  for chest pain, palpitations and leg swelling.  Gastrointestinal:  Negative.  Negative for abdominal pain, blood in stool, constipation, diarrhea, heartburn, melena, nausea and vomiting.  Genitourinary: Negative.  Negative for dysuria, flank pain, frequency and urgency.  Musculoskeletal:  Positive for joint pain and myalgias.  Skin: Negative.   Neurological: Negative.  Negative for dizziness, tingling, sensory change, weakness and headaches.  Endo/Heme/Allergies: Negative.   Psychiatric/Behavioral: Negative.  Negative for depression and suicidal ideas. The patient is not nervous/anxious.        Objective:   BP 124/76   Ht 5' 6 (1.676 m)   Wt 165 lb 6.4 oz (75 kg)   BMI 26.70 kg/m   Vitals:   05/23/24 1125  BP: 124/76  Height: 5' 6 (1.676 m)  Weight: 165 lb 6.4 oz (75 kg)  BMI (Calculated): 26.71    Physical Exam Vitals and nursing note reviewed.  Constitutional:      Appearance: Normal appearance.  HENT:     Head: Normocephalic and atraumatic.     Nose: Nose normal.     Mouth/Throat:     Mouth: Mucous membranes are moist.     Pharynx: Oropharynx is clear.  Eyes:     Conjunctiva/sclera: Conjunctivae normal.     Pupils: Pupils are equal, round, and reactive to light.  Cardiovascular:     Rate and Rhythm: Normal rate and regular rhythm.     Pulses: Normal pulses.     Heart sounds: Normal heart sounds. No murmur heard. Pulmonary:     Effort: Pulmonary effort is normal.     Breath sounds: Normal breath sounds. No wheezing.  Abdominal:     General: Bowel sounds are normal.     Palpations: Abdomen is soft.     Tenderness: There is no abdominal tenderness. There is no right CVA tenderness or left CVA tenderness.  Musculoskeletal:        General: Normal range of motion.     Cervical back: Normal range of motion.     Right lower leg: Edema present.     Left lower leg: Edema present.  Skin:    General: Skin is warm and dry.  Neurological:     General: No focal  deficit present.     Mental Status: She is alert and oriented to person, place, and time.  Psychiatric:        Mood and Affect: Mood normal.        Behavior: Behavior normal.      No results found for any visits on 05/23/24.  Recent Results (from the past 2160 hours)  Basic metabolic panel     Status: Abnormal   Collection Time: 03/07/24 11:11 AM  Result Value Ref Range   Sodium 139 135 - 145 mmol/L   Potassium 4.0 3.5 - 5.1 mmol/L   Chloride 97 (L) 98 - 111 mmol/L   CO2 24 22 - 32 mmol/L   Glucose, Bld 88 70 - 99 mg/dL    Comment: Glucose reference range applies only to samples taken after fasting for at least 8 hours.   BUN 52 (H) 8 - 23 mg/dL   Creatinine, Ser 3.03 (H) 0.44 - 1.00 mg/dL   Calcium  8.7 (L) 8.9 - 10.3 mg/dL   GFR, Estimated 6 (L) >60 mL/min    Comment: (NOTE) Calculated using the CKD-EPI Creatinine Equation (2021)    Anion gap 18 (H) 5 - 15    Comment: Performed at [facility], [address] [place], [place] [address]  Type and screen [facility]  Status: None   Collection Time: 03/07/24 11:11 AM  Result Value Ref Range   ABO/RH(D) O POS    Antibody Screen NEG    Sample Expiration 03/21/2024,2359    Extend sample reason NO TRANSFUSIONS OR PREGNANCY IN THE PAST 3 MONTHS    Unit Number [id number]    Blood Component Type RED CELLS,LR    Unit division 00    Status of Unit REL FROM [facility]    Transfusion Status OK TO TRANSFUSE    Crossmatch Result COMPATIBLE    Unit Number [id number]    Blood Component Type RBC LR PHER1    Unit division 00    Status of Unit REL FROM [facility]    Transfusion Status OK TO TRANSFUSE    Crossmatch Result COMPATIBLE   Surgical pcr screen     Status: Abnormal   Collection Time: 03/07/24 11:11 AM   Specimen: Nasal Mucosa; Nasal Swab  Result Value Ref Range   MRSA, PCR POSITIVE (A) NEGATIVE    Comment: RESULT CALLED TO, READ BACK BY AND VERIFIED WITH: BRYAN GRAY 03/07/24 1409 MW     Staphylococcus aureus POSITIVE (A) NEGATIVE    Comment: (NOTE) The Xpert SA Assay (FDA approved for NASAL specimens in patients 5 years of age and older), is one component of a comprehensive surveillance program. It is not intended to diagnose infection nor to guide or monitor treatment. Performed at [facility], [address] [place], [place] [address]   CBC with Differential/Platelet     Status: Abnormal   Collection Time: 03/07/24 11:11 AM  Result Value Ref Range   WBC 6.4 4.0 - 10.5 K/uL   RBC 4.52 3.87 - 5.11 MIL/uL   Hemoglobin 14.3 12.0 - 15.0 g/dL   HCT 55.0 63.9 - 53.9 %   MCV 99.3 80.0 - 100.0 fL   MCH 31.6 26.0 - 34.0 pg   MCHC 31.8 30.0 - 36.0 g/dL   RDW 84.2 (H) 88.4 - 84.4 %   Platelets 146 (L) 150 - 400 K/uL   nRBC 0.0 0.0 - 0.2 %   Neutrophils Relative % 67 %   Neutro Abs 4.3 1.7 - 7.7 K/uL   Lymphocytes Relative 19 %   Lymphs Abs 1.2 0.7 - 4.0 K/uL   Monocytes Relative 12 %   Monocytes Absolute 0.8 0.1 - 1.0 K/uL   Eosinophils Relative 1 %   Eosinophils Absolute 0.0 0.0 - 0.5 K/uL   Basophils Relative 0 %   Basophils Absolute 0.0 0.0 - 0.1 K/uL   Immature Granulocytes 1 %   Abs Immature Granulocytes 0.03 0.00 - 0.07 K/uL    Comment: Performed at [facility], [address] [place], [place] [address]  BPAM RBC     Status: None   Collection Time: 03/07/24 11:11 AM  Result Value Ref Range   Blood Product Unit Number [id number]    PRODUCT CODE E0382V00    Unit Type and Rh 5100    Blood Product Expiration Date 797489817640    Blood Product Unit Number [id number]    PRODUCT CODE Z5467C99    Unit Type and Rh 5100    Blood Product Expiration Date 797489817640   I-STAT, chem 8     Status: Abnormal   Collection Time: 03/09/24  6:51 AM  Result Value Ref Range   Sodium 132 (L) 135 - 145 mmol/L   Potassium 5.6 (H) 3.5 - 5.1 mmol/L   Chloride 104 98 - 111 mmol/L   BUN 67 (H)  8 - 23 mg/dL   Creatinine, Ser 3.39 (H) 0.44 - 1.00  mg/dL   Glucose, Bld 896 (H) 70 - 99 mg/dL    Comment: Glucose reference range applies only to samples taken after fasting for at least 8 hours.   Calcium , Ion 0.71 (LL) 1.15 - 1.40 mmol/L   TCO2 24 22 - 32 mmol/L   Hemoglobin 15.0 12.0 - 15.0 g/dL   HCT 55.9 63.9 - 53.9 %  Potassium     Status: Abnormal   Collection Time: 03/09/24  7:15 AM  Result Value Ref Range   Potassium 3.4 (L) 3.5 - 5.1 mmol/L    Comment: Performed at [facility], [address], [place], [place] [address]  Potassium [facility] vascular lab only)     Status: Abnormal   Collection Time: 03/30/24 11:54 AM  Result Value Ref Range   Potassium [facility] vascular lab) 6.2 (H) 3.5 - 5.1 mmol/L    Comment: Performed at [facility], [address] [place], [place] [address]  Potassium     Status: Abnormal   Collection Time: 03/30/24 12:42 PM  Result Value Ref Range   Potassium 5.7 (H) 3.5 - 5.1 mmol/L    Comment: Performed at [facility], [address], [place], [place] [address]  Basic metabolic panel     Status: Abnormal   Collection Time: 04/13/24  9:37 AM  Result Value Ref Range   Sodium 140 135 - 145 mmol/L   Potassium 4.2 3.5 - 5.1 mmol/L   Chloride 99 98 - 111 mmol/L   CO2 23 22 - 32 mmol/L   Glucose, Bld 81 70 - 99 mg/dL    Comment: Glucose reference range applies only to samples taken after fasting for at least 8 hours.   BUN 20 8 - 23 mg/dL   Creatinine, Ser 4.51 (H) 0.44 - 1.00 mg/dL   Calcium  8.4 (L) 8.9 - 10.3 mg/dL   GFR, Estimated 8 (L) >60 mL/min    Comment: (NOTE) Calculated using the CKD-EPI Creatinine Equation (2021)    Anion gap 18 (H) 5 - 15    Comment: Performed at [facility], [address] [place], [place] [address]  Type and screen [facility]     Status: None   Collection Time: 04/20/24  6:48 AM  Result Value Ref Range   ABO/RH(D) O POS    Antibody Screen NEG    Sample Expiration      04/23/2024,2359 Performed at  [facility], [address] [place], [place] [address]   I-STAT, [place] 8     Status: Abnormal   Collection Time: 04/20/24  7:08 AM  Result Value Ref Range   Sodium 141 135 - 145 mmol/L   Potassium 4.2 3.5 - 5.1 mmol/L   Chloride 102 98 - 111 mmol/L   BUN 32 (H) 8 - 23 mg/dL   Creatinine, Ser 4.69 (H) 0.44 - 1.00 mg/dL   Glucose, Bld 66 (L) 70 - 99 mg/dL    Comment: Glucose reference range applies only to samples taken after fasting for at least 8 hours.   Calcium , Ion 0.99 (L) 1.15 - 1.40 mmol/L   TCO2 26 22 - 32 mmol/L   Hemoglobin 16.0 (H) 12.0 - 15.0 g/dL   HCT 52.9 (H) 63.9 - 53.9 %  Glucose, capillary     Status: None   Collection Time: 04/20/24 10:36 AM  Result Value Ref Range   Glucose-Capillary 99 70 - 99 mg/dL  Comment: Glucose reference range applies only to samples taken after fasting for at least 8 hours.  Potassium [facility] vascular lab only)     Status: None   Collection Time: 04/24/24  1:56 PM  Result Value Ref Range   Potassium [facility] vascular lab) 3.5 3.5 - 5.1 mmol/L    Comment: Performed at [facility], [address] [place], [place] [address]  Basic metabolic panel     Status: Abnormal   Collection Time: 04/28/24  3:41 PM  Result Value Ref Range   Sodium 142 135 - 145 mmol/L   Potassium 4.1 3.5 - 5.1 mmol/L   Chloride 99 98 - 111 mmol/L   CO2 24 22 - 32 mmol/L   Glucose, Bld 79 70 - 99 mg/dL    Comment: Glucose reference range applies only to samples taken after fasting for at least 8 hours.   BUN 30 (H) 8 - 23 mg/dL   Creatinine, Ser 4.12 (H) 0.44 - 1.00 mg/dL   Calcium  9.3 8.9 - 10.3 mg/dL   GFR, Estimated 8 (L) >60 mL/min    Comment: (NOTE) Calculated using the CKD-EPI Creatinine Equation (2021)    Anion gap 19 (H) 5 - 15    Comment: Performed at [facility], [address] [place], [place] [address]  CBC with Differential     Status: Abnormal   Collection Time: 04/28/24  3:41 PM  Result Value Ref Range   WBC 5.2  4.0 - 10.5 K/uL   RBC 3.95 3.87 - 5.11 MIL/uL   Hemoglobin 12.6 12.0 - 15.0 g/dL   HCT 60.4 63.9 - 53.9 %   MCV 100.0 80.0 - 100.0 fL   MCH 31.9 26.0 - 34.0 pg   MCHC 31.9 30.0 - 36.0 g/dL   RDW 83.9 (H) 88.4 - 84.4 %   Platelets 109 (L) 150 - 400 K/uL   nRBC 0.4 (H) 0.0 - 0.2 %   Neutrophils Relative % 67 %   Neutro Abs 3.5 1.7 - 7.7 K/uL   Lymphocytes Relative 15 %   Lymphs Abs 0.8 0.7 - 4.0 K/uL   Monocytes Relative 14 %   Monocytes Absolute 0.8 0.1 - 1.0 K/uL   Eosinophils Relative 3 %   Eosinophils Absolute 0.2 0.0 - 0.5 K/uL   Basophils Relative 0 %   Basophils Absolute 0.0 0.0 - 0.1 K/uL   Immature Granulocytes 1 %   Abs Immature Granulocytes 0.03 0.00 - 0.07 K/uL    Comment: Performed at [facility], [address] [place], [place] [address]      Assessment & Plan:  Leg elevation, compression stockings, p.o. Keflex , vascular per surgeon referral. Pain control. Problem List Items Addressed This Visit     ESRD on hemodialysis (HCC) (Chronic)   Hyperlipidemia   HFrEF (heart failure with reduced ejection fraction) (HCC) (Chronic)   Chronic pain syndrome   Relevant Medications   oxyCODONE -acetaminophen  (PERCOCET) 5-325 MG tablet   Other Visit Diagnoses       Cellulitis of right lower extremity    -  Primary   Relevant Medications   cephALEXin  (KEFLEX ) 500 MG capsule     Cellulitis of left lower extremity       Relevant Medications   cephALEXin  (KEFLEX ) 500 MG capsule   oxyCODONE -acetaminophen  (PERCOCET) 5-325 MG tablet     Pain in both lower legs       Relevant Medications   oxyCODONE -acetaminophen  (PERCOCET) 5-325 MG tablet   Other Relevant Orders  Ambulatory referral to Vascular Surgery     Venous stasis dermatitis of both lower extremities           Follow up as scheduled.  Total time spent: 30 minutes. This time includes review of previous notes and results and patient face to face interaction during today's visit.    FERNAND FREDY RAMAN,  MD  05/23/2024   This document may have been prepared by [facility] Voice Recognition software and as such may include unintentional dictation errors.

## 2024-05-30 NOTE — Progress Notes (Signed)
 I performed a history and physical examination of [name]  as documented in the resident/fellow/APP note and discussed her  management with:  Treatment Team:  Attending Provider: Darci Orvis Jansky, MD Registered Nurse: Claudene Castilla, RN Physician Assistant: Orlando Leonor Helling, PA    I agree with the history, physical, assessment, and plan of care, with the following exceptions: None  Attestation Statement:   I personally saw the patient and performed a substantive portion of the medical decision making, in conjunction with the Advanced Practice Provider for the condition/treatment of  L foot wound and redness extending into lower leg, present ~ 2 months, worsening overall with erythema now extending proximally into lower leg up to knee. On 12/9 she started a course of keflex  for this lower leg erythema, but redness has continued to spread and pain has increased despite antibiotics. HD normal, afebrile, has significant pain in LEs. Will control pain, consider broadening antibiotics, obtain labs, DVT US , anticipate admission.   CATHLEEN JANSKY DARCI, MD   I personally saw the patient and performed a substantive portion of this encounter in conjunction with the listed APP or resident as documented above. I have reviewed the vital signs and the nursing notes.  Additional history obtained from family, EMS, or other collateral sources if available. Past Medical and Surgical History: PMH/PSH discussed with patient, reviewed in medical record and nursing notes. Social History: Discussed with patient and reviewed in EMR. Medications: Discussed with patient and reviewed in EMR. Allergies: Discussed with patient and reviewed in EMR. Review of Systems: A complete review of systems was performed and is otherwise negative except as noted in the HPI and medical decision making. Labs and radiology results that were available during my care of the patient were independently reviewed by me and  considered in my medical decision making.  I independently visualized the EKG tracing if performed I independently visualized the radiology images if performed I reviewed the patient's prior medical records if available.   ORVIS JANSKY DARCI, MD

## 2024-05-30 NOTE — ED Notes (Signed)
 Patient transported to X-ray

## 2024-05-30 NOTE — ED Provider Notes (Signed)
 " [facility] EMERGENCY DEPT  ED Provider Note   HPI   [name] is a 63 y.o. female with ESRD on hemodialysis, paroxysmal atrial fibrillation, HFrEF, atrial flutter, chronic pain syndrome, NSTEMI, CVA, GERD, pulmonary HTN, Hepatitic C  presenting to ED for evaluation of:  Chief Complaint  Patient presents with   Leg Swelling   History of Present Illness Patient presents with a painful, discolored L foot and leg.  She has had progressive right foot pain and discoloration for eight weeks, initially limited to the foot and in the past few L extending up to the knee. The foot is described as purple and painful to even light touch. About two months ago a vascular doctor reportedly  told her that her circulation was good, but two weeks later another physician noted poor circulation and a cold foot. The pain has markedly worsened over the past month, leading her to seek care. She started Keflex  last week for presumed infection and has three days remaining, but the pain and discoloration have continued to spread up the leg. She denies chest pain, shortness of breath, nausea, vomiting, or fever. She does not have diabetes and does not use a pacemaker.  On chart review: Known history of severe PVD 4 recent ED Visits for same   05/10/24 MRI IMPRESSION:  1. Subcutaneous edema about the ankle extending into the dorsum of the foot  without evidence of hindfoot osteomyelitis.  2. 1.5 x 0.7 cm nonfragmented osteochondral lesion of the medial talar dome  with overlying prominent chondral thinning.  3. Mild tibialis posterior and flexor digitorum longus tenosynovitis.  4. Mild distal Achilles tendinopathy.  5. Plantar calcaneal spur.  Due to language barrier, an interpreter was present:  Level of Interpreter Services: No interpreter needed (no language barrier)  Past Medical History:  Diagnosis Date   Angina pectoris (CMS-HCC)    Behavioral problems    Behavioral issues in the setting of  frontal lobe CVA.    Cardiomyopathy (CMS/HHS-HCC)    CHF (congestive heart failure) (CMS/HHS-HCC)    Chicken pox    Coronary disease    End stage renal disease (CMS/HHS-HCC)    on dialysis   GERD (gastroesophageal reflux disease)    History of bipolar disorder    Hypertension    Myocardial infarction (CMS/HHS-HCC) 12/13   with cardiac stenting   Obesity    PVD (peripheral vascular disease) (CMS-HCC)    Smoking    Stroke (CMS/HHS-HCC) 2011   Substance abuse (CMS/HHS-HCC)    Nonadherence, positive cocaine test 2012.   Superficial incisional surgical site infection 03/14/2014    Past Surgical History:  Procedure Laterality Date   EGD  02/17/2013   (dysphagia) LA grade C reflux esophagitis, biopsied, dilated.   CORONARY ARTERY BYPASS W/SINGLE ARTERY GRAFT N/A 02/20/2014   Procedure: CORONARY ARTERY BYPASS W/SINGLE ARTERY GRAFT, Left internal mammary artery harvest;  Surgeon: Reyes Renella Fruits, MD;  Location: [facility]PER[facility];  Service: Cardiothoracic;  Laterality: N/A;   CORONARY ARTERY BYPASS W/VENOUS & ARTERIAL GRAFTS N/A 02/20/2014   Procedure: CORONARY ARTERY BYPASS W/VENOUS & ARTERIAL GRAFTS;  Surgeon: Reyes Renella Fruits, MD;  Location: [facility];  Service: Cardiothoracic;  Laterality: N/A;   TRANSESOPHAGEAL ECHOCARDIOGRAPHY N/A 02/20/2014   Procedure: TRANSESOPHAGEAL ECHOCARDIOGRAPHY;  Surgeon: Reyes Renella Fruits, MD;  Location: [facility];  Service: Cardiothoracic;  Laterality: N/A;   PERCUTANEOUS INSERTION INTRA-AORTIC BALLOON CATH Right 02/20/2014   Procedure: PERCUTANEOUS INSERTION INTRA-AORTIC BALLOON CATH;  Surgeon: Reyes Renella Fruits, MD;  Location: [facility];  Service: Cardiothoracic;  Laterality: Right;   ULTRASOUND-GUIDED PERICARDIOCENTESIS  04/13/2014       THORACOSCOPY WITH CREATION PERICARDIAL WINDOW Left 04/20/2014   Procedure: THORACOSCOPY W/CREATION PERICARDIAL WINDOW;  Surgeon: Alm Helayne Starlin Mickey., MD;   Location: [facility];  Service: Cardiothoracic;  Laterality: Left;  possible thoracotomy   COLONOSCOPY  02/23/2019   Hyperplastic colon polyp/Abnormal Cologaurd/Repeat 2 to 3 yrs/MUS   COLONOSCOPY  04/22/2021   Poor colon prep/Stool present/Repeat next available/CTL Scheduled for 07/22/2021   EGD  04/22/2021   Gastritis/Hx Cirrhosis/Repeat 32yrs/CTL   CARDIAC SURGERY  April 2015, Feb 15, 2014   PCI   Status post angioplasty     with stent placement   Status post shunt placement      Family History  Problem Relation Age of Onset   Diabetes type II Mother    High blood pressure (Hypertension) Mother    High blood pressure (Hypertension) Father    High blood pressure (Hypertension) Sister    No Known Problems Brother    Breast cancer Maternal Aunt    Breast cancer Maternal Grandmother    Diabetes type II Maternal Grandmother    High blood pressure (Hypertension) Maternal Grandmother     Social History   Socioeconomic History   Marital status: Single   Number of children: 2   Years of education: HS  Occupational History   Occupation: disabled  Tobacco Use   Smoking status: Every Day    Current packs/day: 0.00    Types: Cigarettes    Last attempt to quit: 02/19/2014    Years since quitting: 10.2   Smokeless tobacco: Never  Substance and Sexual Activity   Alcohol use: No    Alcohol/week: 0.0 standard drinks of alcohol   Drug use: No   Sexual activity: Never    Partners: Male    Birth control/protection: None  Social History Narrative   She has high school education.  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  She smoked less than a pack of cigarettes a day for 10 years but quit smoking in September, 2015. She does not use any alcohol.  She used to smoke some marijuana and to a few other recreational drugs in her youth.  She is single and has 2 healthy daughters.  She lives with her daughter in [place], [place]  but is now at [facility] in [place].   Social Drivers of Health   Food Insecurity: No Food Insecurity (05/18/2023)   Received from [facility]   Hunger Vital Sign    Within the past 12 months, you worried that your food would run out before you got the money to buy more.: Never true    Within the past 12 months, the food you bought just didn't last and you didn't have money to get more.: Never true  Transportation Needs: No Transportation Needs (05/18/2023)   Received from [facility] - Transportation    Lack of Transportation (Medical): No    Lack of Transportation (Non-Medical): No     Review of Systems   A focused ROS was performed and is negative aside from the pertinent positives and negatives noted in the HPI.   Physical Exam   BP 109/69   Pulse 82   Temp 36.6 C (97.9 F) (Tympanic)   Resp 16   SpO2 97%  Physical Exam Vitals and nursing note reviewed.  Constitutional:      General: She  is not in acute distress.    Appearance: Normal appearance. She is ill-appearing (Appears uncomfortable). She is not toxic-appearing.  HENT:     Mouth/Throat:     Mouth: Mucous membranes are dry.  Cardiovascular:     Rate and Rhythm: Normal rate and regular rhythm.  Musculoskeletal:     Comments: L foot and lower leg with erythema, extending past knee. Exquisitely ttp. Cool to touch. Doppler signal present DP & PT. Compartment firm but symmetrical. Ulcer to dorsum of foot - see photo      Physical Exam  Procedures  Procedures  Assessment:  MDM, ED COURSE, INTERVENTIONS and DISPOSITION:   Medical Decision Making A 63 year old female with ESRD on hemodialysis, paroxysmal atrial fibrillation, HFrEF, atrial flutter, chronic pain syndrome, NSTEMI, CVA, GERD, pulmonary HTN, Hepatitic C presenting with a two-month history of left lower extremity discoloration and pain, worsening over the past month, now with extension of symptoms up to the knee, despite outpatient keflex . She has a history  of PAD and PVD.Exam revealed a cold, painful foot with suboptimal but present Doppler signal, no wounds between toes, +ulcer to foot dorsum. She denied chest pain, dyspnea, fever, or recent trauma.  Differential diagnosis includes, but is not limited to: - Cellulitis with possible deep tissue or bone involvement - Necrotizing fasciitis - Compartment syndrome - Venous thromboembolism (DVT).  Workup ongoing, disposition pending though favor admission. Plan for symptomatic management in interim.     Plan:   US  lower extremity venous left    (Results Pending)  X-ray foot left 3 plus views    (Results Pending)    Results for orders placed or performed during the hospital encounter of 05/30/24  ECG 12-lead   Collection Time: 05/30/24 11:05 AM  Result Value Ref Range   Vent Rate (bpm) 97    PR Interval (msec) 208    QRS Interval (msec) 178    QT Interval (msec) 344    QTc (msec) 436     Labs Reviewed  COMPLETE BLOOD COUNT (CBC) WITH DIFFERENTIAL  COMPREHENSIVE METABOLIC PANEL (CMP)  SEDIMENTATION RATE - AUTOMATED  C-REACTIVE PROTEIN (CRP), INFLAMMATORY  LACTATE, PLASMA (LACTIC ACID) VENOUS    Medications  fentaNYL  (PF) (SUBLIMAZE ) injection 50 mcg (has no administration in time range)  lactated ringers  bolus 1,000 mL (has no administration in time range)    MEDICAL COMPLEXITY  This is a face-to-face encounter in which all nursing notes, pertinent labs, x-rays, past medical history, previous chart history, and clinical data have been independently reviewed, acted upon, and discussed with the patient and/or guardian.   Medical Complexity:    New and requires workup.     Pertinent labs & imaging results that were available during my care of the patient were reviewed by me and considered in my medical decision making.     I obtained history from someone other than the patient.         Family member present at bedside   I reviewed previous medical records.     I discussed the  patient with another provider.     Updates ED Course as of 05/30/24 1816  Tue May 30, 2024  1217 Creatinine(!): 6.2 Mild Aoc aki [SP]  1217 C Reactive Protein: 0.81 [SP]  1313 X-ray foot left 3 plus views FINDINGS/IMPRESSION:  Diffusely decreased bone density. No acute osseous abnormality.  Moderate hallux valgus with metatarsus primus varus and sesamoid lateralization.  No dislocation. Mild multifocal joint space loss. Small plantar calcaneal enthesophyte.    [  SP]  1446 Signed out pending admit v CEU for continued monitoring.  [SP]  1459 I assumed care on 05/30/2024 at 2:59 PM at shift change. Per verbal report from the previous care team:  Progressively worsening redness/warmth to LLE over the last 2 months. Ulcerative lesion to top of L foot that does not probe to bone. X-ray did not show bony involvement. Streaking up to thigh. No crepitus. Low concern for nec fasc. Has been on keflex  since 05/23/2024. Vascular - poor circulation.   Pending  - ESR/CRP - U/S - ?CEU for cellulitis vs ?gen med - ?consult vascular surgery - Bactrim (for PO MRSA coverage) [AL]  1533 Lactate, Blood: 1.6 Lactate within normal limits. Patient is not currently in shock. [AL]  1534 C Reactive Protein: 0.81 Lower suspicion for systemic infectious or inflammatory process. [AL]  1535 Creatinine(!): 6.2 Slightly elevated compared to prior.  Has known history of CKD.  Otherwise, CMP does not show any electrolyte derangement, transaminitis, hyperbilirubinemia requiring emergent management. [AL]  1537 X-ray foot left 3 plus views Diffusely decreased bone density. No acute osseous abnormality. Moderate hallux valgus with metatarsus primus varus and sesamoid lateralization. No dislocation. Mild multifocal joint space loss. Small plantar calcaneal enthesophyte. I personally interpreted this and agree with the findings. Lower suspicion for osteomyelitis. [AL]  1538 ECG 12-lead Probable Atrial flutter with  premature ventricular or aberrantly conducted complexes  The poor quality of the tracing precludes definitive diagnosis of the rhythm  Indeterminate axis  Right bundle branch block  Marked QRS Prolongation  Compared to EKG from 08/27/2023, sinus rhythm no longer present difficult to determine with the EKG quality. I personally interpreted this and agree with the findings.  [AL]  1541 Evaluating prior OSH note from podiatry (Dr. Thresa Sar) from 05/19/24, he states:  The findings on MRI do not correlate with the patient's symptoms and pain that she is experiencing. Very concerning for critical limb ischemia especially with the increased discoloration and distention of the leg and waxy skin appearance. Severe pain with light touch. It is now extended proximal to the level of the knee. Due to the progressive nature of her symptoms concerning for possible limb loss. This is likely contributing to patient's symptoms today. Will consult vascular surgery. [AL]  1641 Sedimentation Rate-Automated: 3 Sedimentation rate within normal limits. Lower concern for infectious or inflammatory process. [AL]  1641 WBC: 4.9 CBC shows no anemia, leukocytosis, or thrombocytopenia requiring emergent management at this time.  Lower suspicion for systemic infectious process. [AL]  1656 Consulted Vascular ([facility]) and they recommended CT A/P with run off, admit to gen med with vascular consulted, ABIs, toe pressures, consult nephrology for dialysis (M/W/F schedule), and IV antibiotics (CTX ordered). [AL]  1718 US  lower extremity venous left Examination is limited as patient was unable to tolerate compression views of the proximal thigh, and by poor beam echo penetrance in the calf. Within these constraints, the following impression is made: 1. No deep vein thrombosis identified in the left common femoral, femoral, and popliteal veins. 2. No definite deep vein thrombosis identified in the visualized portions of the left  posterior tibial and peroneal veins. I personally interpreted this and agree with the findings. [AL]  1747 Per vascular ([facility]), patient's graft is no longer usable and blood pressures can be taken on right arm. [AL]  1809 Consulted general medicine and admitted to  [AL]    ED Course User Index [AL] Karna Lavanda Dixon, [place] [SP] [place], [place], [place]  Signed out pending remaining workup  Patient care signed out to incoming team. Relevant history, physical finding, labs and tentative disposition plans discussed with incoming team.     ED Clinical Impression  1. Dialysis patient ()                 Leonor Ellen, PA-C [facility]   Parts of this note may have been written using Dragon dictation. There may be spelling and/or grammatical errors and phrases that were mistakenly interpreted by the software and missed in my review.   This note has been created using automated tools and reviewed for accuracy by [facility] LYNN PHILLIPS.      Ellen Leonor Kep'el, [place] 06/12/24 2213  "

## 2024-05-30 NOTE — H&P (Signed)
 "  Hospital Medicine Admission History & Physical  Time of Service: 05/30/2024, 7:12 PM  PCP: Orlean Alan Jansky, NP, Phone [phone], Fax [phone]   Chief Complaint  Bilateral lower leg pain  History of Present Illness  [name] is a 63 y.o. female with ESRD on HD MWF, pAF/flutter on Eliquis , HFrEF, h/o NSTEMI, pulmonary hypertension, chronic pain, GERD h/o hepatitis C that presented to the ED for progressive bilateral foot pain and discoloration for the last month. She describes it as a burning pain. No aggravating or alleviating factors, sometimes feels better when she dangles it off the side of bed, sometimes feels better when she elevates her leg. She has been unable to ambulate, and sits/ sleeps in a recliner, and has been using a wheelchair to get around. She denies fever/chills, chest pain, shortness of breath, abdominal pain, nausea/vomiting.   She has been seen at [facility]. She was last seen by vascular surgery on 11/25, angiogram was done without significant arterial disease in her lower extremities. She was seen on 12/5 by podiatry, had MR done, that did not explain her leg pain and noted concerns for limb ischemia and follow-up with vascular. She also saw her PCP and was recently started on Keflex  due to c/f cellulitis without improvement in her pain. She feels like pain and redness had increased in her right leg with extension up to her knee.   She has poor quality of life due to the pain. She comes to [facility] for a second opinion.  In the ED, temp was 97.1, HR 96, BP 109/69, 99% RA. Labs with WBC 4.9, Hgb 12.6, plts 140. Na 143, K 4.9, Cl 101, bicarb 25, BUN 23, and Cr 6.2. US  with no DVT identified, exam limited by pain. XR left foot with moderate hallux valgus with metatarsus primus varus and sesamoid lateralization. Vascular surgery was consulted in the ED.      Medical History  Past Medical History Past Medical History:  Diagnosis Date   Angina pectoris  (CMS-HCC)    Behavioral problems    Behavioral issues in the setting of frontal lobe CVA.    Cardiomyopathy (CMS/HHS-HCC)    CHF (congestive heart failure) (CMS/HHS-HCC)    Chicken pox    Coronary disease    End stage renal disease (CMS/HHS-HCC)    on dialysis   GERD (gastroesophageal reflux disease)    History of bipolar disorder    Hypertension    Myocardial infarction (CMS/HHS-HCC) 12/13   with cardiac stenting   Obesity    PVD (peripheral vascular disease) (CMS-HCC)    Smoking    Stroke (CMS/HHS-HCC) 2011   Substance abuse (CMS/HHS-HCC)    Nonadherence, positive cocaine test 2012.   Superficial incisional surgical site infection 03/14/2014    Past Surgical History Past Surgical History:  Procedure Laterality Date   EGD  02/17/2013   (dysphagia) LA grade C reflux esophagitis, biopsied, dilated.   CORONARY ARTERY BYPASS W/SINGLE ARTERY GRAFT N/A 02/20/2014   Procedure: CORONARY ARTERY BYPASS W/SINGLE ARTERY GRAFT, Left internal mammary artery harvest;  Surgeon: Reyes Renella Fruits, MD;  Location: [facility];  Service: Cardiothoracic;  Laterality: N/A;   CORONARY ARTERY BYPASS W/VENOUS & ARTERIAL GRAFTS N/A 02/20/2014   Procedure: CORONARY ARTERY BYPASS W/VENOUS & ARTERIAL GRAFTS;  Surgeon: Reyes Renella Fruits, MD;  Location: [facility];  Service: Cardiothoracic;  Laterality: N/A;   TRANSESOPHAGEAL ECHOCARDIOGRAPHY N/A 02/20/2014   Procedure: TRANSESOPHAGEAL ECHOCARDIOGRAPHY;  Surgeon: Reyes Renella Fruits, MD;  Location: [facility];  Service: Cardiothoracic;  Laterality: N/A;   PERCUTANEOUS INSERTION INTRA-AORTIC BALLOON CATH Right 02/20/2014   Procedure: PERCUTANEOUS INSERTION INTRA-AORTIC BALLOON CATH;  Surgeon: Reyes Renella Fruits, MD;  Location: [facility];  Service: Cardiothoracic;  Laterality: Right;   ULTRASOUND-GUIDED PERICARDIOCENTESIS  04/13/2014       THORACOSCOPY WITH CREATION PERICARDIAL WINDOW Left 04/20/2014    Procedure: THORACOSCOPY W/CREATION PERICARDIAL WINDOW;  Surgeon: Alm Helayne Starlin Mickey., MD;  Location: [facility];  Service: Cardiothoracic;  Laterality: Left;  possible thoracotomy   COLONOSCOPY  02/23/2019   Hyperplastic colon polyp/Abnormal Cologaurd/Repeat 2 to 3 yrs/MUS   COLONOSCOPY  04/22/2021   Poor colon prep/Stool present/Repeat next available/CTL Scheduled for 07/22/2021   EGD  04/22/2021   Gastritis/Hx Cirrhosis/Repeat 53yrs/CTL   CARDIAC SURGERY  April 2015, Feb 15, 2014   PCI   Status post angioplasty     with stent placement   Status post shunt placement      Family History Family History  Problem Relation Name Age of Onset   Diabetes type II Mother     High blood pressure (Hypertension) Mother     High blood pressure (Hypertension) Father     High blood pressure (Hypertension) Sister     No Known Problems Brother     Breast cancer Maternal Aunt     Breast cancer Maternal Grandmother     Diabetes type II Maternal Grandmother     High blood pressure (Hypertension) Maternal Grandmother      Social History Social History   Socioeconomic History   Marital status: Single   Number of children: 2   Years of education: HS  Occupational History   Occupation: disabled  Tobacco Use   Smoking status: Every Day    Current packs/day: 0.00    Types: Cigarettes    Last attempt to quit: 02/19/2014    Years since quitting: 10.2   Smokeless tobacco: Never  Substance and Sexual Activity   Alcohol use: No    Alcohol/week: 0.0 standard drinks of alcohol   Drug use: No   Sexual activity: Never    Partners: Male    Birth control/protection: None  Social History Narrative   She has high school education.  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  She smoked less than a pack of cigarettes a day for 10 years but quit smoking in September, 2015. She does not use any alcohol.  She used to smoke some marijuana and to a few other  recreational drugs in her youth.  She is single and has 2 healthy daughters.  She lives with her daughter in [place], [place]  but is now at [facility] in [place].   Social Drivers of Health   Food Insecurity: No Food Insecurity (05/18/2023)   Received from [facility]   Hunger Vital Sign    Within the past 12 months, you worried that your food would run out before you got the money to buy more.: Never true    Within the past 12 months, the food you bought just didn't last and you didn't have money to get more.: Never true  Transportation Needs: No Transportation Needs (05/18/2023)   Received from [facility] - Transportation    Lack of Transportation (Medical): No    Lack of Transportation (Non-Medical): No    Allergies & Medications   Allergies  Allergen Reactions   Morphine  Hives    Has tolerated Dilaudid  in the past.   Levofloxacin  Itching    Severe itching; prickly sensation   Adhesive Other (See Comments)   Adhesive Tape-Silicones Rash   Enalapril  Other (See Comments)    hallucinations   Latex Rash    Medications Prior to Admission Medications  Prescriptions Last Dose Taking?  APIXABAN  (ELIQUIS  ORAL)  No  Sig: Take 2 tablets by mouth once daily 5 mg twice daily  hydrOXYzine  (ATARAX ) 50 MG tablet  No  Sig: Take 50 mg by mouth 3 (three) times daily as needed for Itching.  levothyroxine  (SYNTHROID ) 88 MCG tablet  No  lidocaine -prilocaine  (EMLA ) cream  No  Sig: Apply 1.5 g topically as needed  metoprolol  SUCCinate (TOPROL -XL) 25 MG XL tablet  No  Sig: Take 1 tablet (25 mg total) by mouth once daily  midodrine  (PROAMATINE ) 10 MG tablet  No  Sig: Take 10 mg by mouth once daily Taking dialysis days, MWF  pantoprazole  (PROTONIX ) 40 MG DR tablet  No  Sig: Take 40 mg by mouth 2 (two) times daily. Reported on 07/26/2015   predniSONE (DELTASONE) 5 MG tablet  No  Sig: Take 1 tablet (5 mg total) by mouth once daily 6, 5, 4, 3, 2,1, off  rosuvastatin   (CRESTOR ) 20 MG tablet  No  Sig: Take 20 mg by mouth once daily    Facility-Administered Medications: None     Review of Systems  A complete review of systems was performed and is negative except as reviewed in the HPI.  Physical Exam    Current Vital Signs 24h Vital Sign Ranges  T 36.2 C (97.1 F) (05/30/24 1634) Temp  Avg: 36.4 C (97.5 F)  Min: 36.2 C (97.1 F)  Max: 36.6 C (97.9 F)  BP 109/69 (05/30/24 0944) BP  Min: 109/69  Max: 109/69  HR 96 (05/30/24 1634) Pulse  Avg: 89  Min: 82  Max: 96  RR 17 (05/30/24 1634) Resp  Avg: 16.5  Min: 16  Max: 17  O2sat 99 %   SpO2  Avg: 98 %  Min: 97 %  Max: 99 %  Weight     There is no height or weight on file to calculate BMI. General: alert, cooperative, acute distress Eyes: conjunctiva clear, anicteric sclera HENT: normocephalic, atraumatic, oropharynx clear, moist mucous membranes Neck: no adenopathy, no JVD, supple, symmetrical, trachea midline, and no thyromegaly CV: irregularly irregular rhythm, without murmurs, rubs or gallops Resp: clear to auscultation, good air exchange Abd: soft, nontender, nondistended, normoactive bowel sounds  Rectal: deferred Ext: Mild bilateral lower extremity edema Skin: Bilateral lower extremities tender to light touch, erythema noted on anterior shins bilaterally, no open lesions or drainage Psych: oriented to time, place and person, mood and affect are appropriate Neuro: CN II-XII intact. Grossly normal and symmetric strength in upper and lower extremities.  Data   Recent Results (from the past 24 hours)  ECG 12-lead   Collection Time: 05/30/24 11:05 AM  Result Value Ref Range   Vent Rate (bpm) 97    PR Interval (msec) 208    QRS Interval (msec) 178    QT Interval (msec) 344    QTc (msec) 436   Comprehensive Metabolic Panel (CMP)   Collection Time: 05/30/24 11:34 AM  Result Value Ref Range   Sodium 143 135 - 145 mmol/L   Potassium 4.9 3.5 - 5.0 mmol/L   Chloride 101 98 - 108 mmol/L    Carbon Dioxide (CO2) 25 21 - 30 mmol/L   Urea Nitrogen (BUN) 23 (H) 7 - 20 mg/dL  Creatinine 6.2 (H) 0.4 - 1.0 mg/dL   Glucose 84 70 - 859 mg/dL   Calcium  9.1 8.7 - 10.2 mg/dL   AST (Aspartate Aminotransferase) 35 15 - 41 U/L   ALT (Alanine Aminotransferase) 15 10 - 39 U/L   Bilirubin, Total 1.5 0.4 - 1.5 mg/dL   Alk Phos (Alkaline Phosphatase) 73 24 - 110 U/L   Albumin  3.6 3.5 - 4.8 g/dL   Protein, Total 7.5 6.2 - 8.1 g/dL   Anion Gap 17 (H) 3 - 12 mmol/L   BUN/CREA Ratio 4 (L) 6 - 27   Glomerular Filtration Rate (eGFR)  7 mL/min/1.73sq m  C-Reactive Protein (CRP), Inflammatory   Collection Time: 05/30/24 11:34 AM  Result Value Ref Range   CRP (C-reactive Protein, Inflammatory) 0.81 <=0.85 mg/dL  Lactate, Plasma (Lactic Acid) Venous   Collection Time: 05/30/24 12:14 PM  Result Value Ref Range   Lactate, Blood 1.6 0.6 - 2.2 mmol/L  Sedimentation Rate-Automated   Collection Time: 05/30/24  3:05 PM  Result Value Ref Range   Sedimentation Rate-Automated 3 <30 mm/hr  Complete Blood Count (CBC) with Differential   Collection Time: 05/30/24  3:05 PM  Result Value Ref Range   WBC (White Blood Cell Count) 4.9 3.2 - 9.8 x109/L   Hemoglobin 12.6 11.7 - 15.5 g/dL   Hematocrit 59.1 64.9 - 45.0 %   Platelets 140 (L) 150 - 450 x109/L   MCV (Mean Corpuscular Volume) 101 (H) 80 - 98 fL   MCH (Mean Corpuscular Hemoglobin) 31.3 26.5 - 34.0 pg   MCHC (Mean Corpuscular Hemoglobin Concentration) 30.9 (L) 31.0 - 36.0 %   RBC (Red Blood Cell Count) 4.03 3.77 - 5.16 x1012/L   RDW-CV (Red Cell Distribution Width) 17.0 (H) 11.5 - 14.5 %   NRBC (Nucleated Red Blood Cell Count) 0.00 0 x109/L   NRBC % (Nucleated Red Blood Cell %) 0.0 %   MPV (Mean Platelet Volume) 12.4 (H) 7.2 - 11.7 fL   Neutrophil Count 2.8 2.0 - 8.6 x109/L   Neutrophil % 57.6 37 - 80 %   Lymphocyte Count 1.2 0.6 - 4.2 x109/L   Lymphocyte % 23.9 10 - 50 %   Monocyte Count 0.8 0 - 0.9 x109/L   Monocyte % 16.1 (H) 0 - 12 %    Eosinophil Count 0.08 0 - 0.70 x109/L   Eosinophil % 1.6 0 - 7 %   Basophil Count 0.01 0 - 0.20 x109/L   Basophil % 0.2 0 - 2 %   Immature Granulocyte Count 0.03 <=0.06 x109/L   Immature Granulocyte % 0.6 <=0.7 %    EKG: aflutter, HR 97  Radiology Studies on Admission: US  lower extremity venous left Result Date: 05/30/2024 Procedure: US  LOWER EXTREMITY VEIN LEFT Indication: LLE pain, Z99.2 Dependence on renal dialysis ()  Comparison: None Technique: Gray-scale ultrasound and color Doppler images of the deep veins of the left lower extremity from the level of the common femoral vein to the level of the popliteal vein. Spectral Doppler evaluation of the bilateral common femoral veins and the left popliteal vein. Gray-scale ultrasound and color Doppler images of left posterior tibial and peroneal veins. Findings: Spectral Doppler evaluation of the bilateral common femoral veins demonstrates symmetric waveforms.     Common femoral vein: Compressible. No thrombus seen.     Proximal portion of the deep femoral vein: No thrombus seen. Exam limited as patient unable to tolerate compression.     Proximal portion of the great saphenous vein: No thrombus  seen.     Femoral vein: Compressible. No thrombus seen.     Popliteal vein: Compressible. No thrombus seen.     Visualized portions of the posterior tibial and peroneal veins: No thrombus seen. Evaluation is limited due to poor beam echo penetrance. Impression: Examination is limited as patient was unable to tolerate compression views of the proximal thigh, and by poor beam echo penetrance in the calf. Within these constraints, the following impression is made: 1. No deep vein thrombosis identified in the left common femoral, femoral, and popliteal veins. 2. No definite deep vein thrombosis identified in the visualized portions of the left posterior tibial and peroneal veins. Electronically Reviewed by:  Mabel Layer, MD, [facility] Electronically  Reviewed on:  05/30/2024 3:54 PM I have reviewed the images and concur with the above findings. Electronically Signed by:  Beverley Norfolk, MD, [facility] Electronically Signed on:  05/30/2024 4:28 PM  X-ray foot left 3 plus views Result Date: 05/30/2024 LEFT FOOT 3 VIEWS CLINICAL INFORMATION: LEG SWELLING, Z99.2 Dependence on renal dialysis (). COMPARISON: None FINDINGS/IMPRESSION: Diffusely decreased bone density. No acute osseous abnormality. Moderate hallux valgus with metatarsus primus varus and sesamoid lateralization. No dislocation. Mild multifocal joint space loss. Small plantar calcaneal enthesophyte. Electronically Signed by:  Glade Root, MD, [facility] Electronically Signed on:  05/30/2024 1:10 PM   Prior imaging from outside hospital MR FOOT LEFT WO CONTRAST (Accession [id number]) (Order [id number]) Imaging Date: 05/10/2024 Department: [facility] Released By: Jakie Milling Authorizing: Janit Thresa HERO, DPM  IMPRESSION: 1. Fracture of the proximal metaphysis of the proximal phalanx extending to the proximal articular surface. 2. No MRI evidence of osteomyelitis in the forefoot. 3. Dorsal forefoot subcutaneous edema without abscess or drainable fluid collection; cellulitis not excluded.   MR ANKLE LEFT WO CONTRAST (Accession [id number]) (Order [id number]) Imaging Date: 05/10/2024 Department: [facility] Released By: Jakie Milling Authorizing: Janit Thresa HERO, DPM  IMPRESSION: 1. Subcutaneous edema about the ankle extending into the dorsum of the foot without evidence of hindfoot osteomyelitis. 2. 1.5 x 0.7 cm nonfragmented osteochondral lesion of the medial talar dome with overlying prominent chondral thinning. 3. Mild tibialis posterior and flexor digitorum longus tenosynovitis. 4. Mild distal Achilles tendinopathy. 5. Plantar calcaneal spur.  US  Venous Img Lower Unilateral Left Result Date: 04/28/2024 CLINICAL DATA:  Left lower extremity  edema. EXAM: LEFT LOWER EXTREMITY VENOUS DOPPLER ULTRASOUND TECHNIQUE: Gray-scale sonography with graded compression, as well as color Doppler and duplex ultrasound were performed to evaluate the lower extremity deep venous systems from the level of the common femoral vein and including the common femoral, femoral, profunda femoral, popliteal and calf veins including the posterior tibial, peroneal and gastrocnemius veins when visible. The superficial great saphenous vein was also interrogated. Spectral Doppler was utilized to evaluate flow at rest and with distal augmentation maneuvers in the common femoral, femoral and popliteal veins. COMPARISON:  None Available. FINDINGS: Contralateral Common Femoral Vein: Respiratory phasicity is normal and symmetric with the symptomatic side. No evidence of thrombus. Normal compressibility. Common Femoral Vein: No evidence of thrombus. Normal compressibility, respiratory phasicity and response to augmentation. Saphenofemoral Junction: No evidence of thrombus. Normal compressibility and flow on color Doppler imaging. Profunda Femoral Vein: No evidence of thrombus. Normal compressibility and flow on color Doppler imaging. Femoral Vein: No evidence of thrombus. Normal compressibility, respiratory phasicity and response to augmentation. Popliteal Vein: No evidence of thrombus. Normal compressibility, respiratory phasicity and response to augmentation. Calf Veins: No evidence of thrombus. Normal  compressibility and flow on color Doppler imaging. Superficial Great Saphenous Vein: No evidence of thrombus. Normal compressibility. Venous Reflux:  None. Other Findings: No evidence of superficial thrombophlebitis or abnormal fluid collection. IMPRESSION: No evidence of left lower extremity deep venous thrombosis. Electronically Signed   By: Marcey Moan M.D.   On: 04/28/2024 16:51    Assessment & Plan  [name] is a 63 y.o. female admitted for the following  problems: Active Problems:   HFrEF (heart failure with reduced ejection fraction) (CMS/HHS-HCC)   History of hepatitis C   ESRD (end stage renal disease) on dialysis (CMS/HHS-HCC)   Bilateral leg pain   # Bilateral lower extremity neuropathy and hyperesthesia - Vascular surgery consulted in ED - Consulted podiatry - Will obtain CTA with runoff of bilateral lower extremities - ABI's with toe pressures - Pain management:   Scheduled Tylenol  975mg  TID  Oxycodone  5-10mg  PRN moderate to severe pain  Dilaudid  0.5mg  IV PRN severe/ breakthrough pain  Start Lyrica 25mg  qHS   If not improving can reach out to pain management. Daughter's preference is that pt not be started on PCA due to prior history of substance abuse - Bowel regimen while on opioids - Obtain A1c, thyroid  studies, vitamin B12, Ca, Mag  # C/f cellulitis # Venous stasis dermatitis Recently prescribed Keflex , has completed 3 days thus far. Unclear is presentation is consistent with cellulitis. She does have redness in both of her lower extremities and significant pain. She notes that the redness to the RLE has extended up to her knee. Physical exam and symptoms are affecting both of her legs, timeline and degree of pain appear out of proportion for cellulitis. No systemic signs of infection. CRP 0.81, ESR 3.  - Continue IV abx with Ceftriaxone  for now.   # ESRD on HD MWF - Nephrology consulted for continuation of dialysis - Continue home midodrine  10mg  on MWF prior to dialysis  # Paroxysmal afib/ flutter - Continue home Eliquis  5mg  BID - Metoprolol  succinate 25mg  daily  # Hypothyroidism - Continue levothyroxine  75mcg daily  # GERD - Continue pantoprazole  40mg  daily  # HLD - Continue rosuvastatin  20mg  daily   Comorbid Conditions: Hematologic Disorders:    Thrombocytopenia:  Thrombocytopenia present with lowest platelet count of 140.  Will continue to monitor and assess for bleeding complications.       VTE  prophylaxis: Continue home Eliquis   Code Status: Full Code  Patient Class & Status: Inpatient, Intermediate  Discharge Planning: Current Ambulatory Status: non-ambulatory Anticipated Discharge Needs: PT and OT Anticipated Discharge Needs: Pending therapy evaluation    LORANE RUTH, MD  [facility] 05/30/2024 7:12 PM   "

## 2024-05-30 NOTE — Consults (Signed)
 "  Nephrology Consult Note: [facility]  History of Present Illness   [name] is a 63 y.o. female with PMH of ESRD on HD MWF through [facility], prior CVA, Afib, Bipolar disorder, CHF, COPD, gout, CAD with NSTEMI in 2015 with CABG, pHTN,  presents with bilateral leg pain. Nephrology is consulted for maintenance HD.   She has presented to multiple facilities for bilateral lower extremity pain without clear etiology found. This pain in her legs has been getting worse over 2 months. She last had hemodialysis on Monday without complication through a perm cath. Her old AVF has been ligated. No urine made. She has been on dialysis for 14 years. Dry weight 160, last weight was 164.4. Denies nausea, metallic taste, shortness of breath, or LE edema.   ROS  The review of systems was negative unless otherwise stated in the HPI.   Past Medical History   Patient Active Problem List   Diagnosis Date Noted   Bilateral leg pain 05/30/2024   Hypothyroidism 09/18/2021   Paroxysmal atrial fibrillation (CMS/HHS-HCC) 09/18/2021   Acute colitis, unspecified 02/01/2021   Subdural hematoma (CMS-HCC) 07/25/2020   Non-STEMI (non-ST elevated myocardial infarction) (CMS/HHS-HCC) 08/07/2018   Pneumonia 01/31/2018   H/O atrial flutter 04/12/2014   History of delirium 04/12/2014   S/P CABG (coronary artery bypass graft) 04/02/2014   Arteriosclerotic dementia, with behavioral disturbance (CMS-HCC) 03/25/2014   Atrial flutter (CMS/HHS-HCC) 03/22/2014   Obesity 02/27/2014   H/O: substance abuse (CMS-HCC) 02/27/2014   Anemia of chronic renal failure 02/27/2014   Dialysis patient () 02/21/2014   HFrEF (heart failure with reduced ejection fraction) (CMS/HHS-HCC) 02/13/2014   History of hepatitis C 02/13/2014   End stage renal disease with hypertension (CMS/HHS-HCC) 02/13/2014   ESRD (end stage renal disease) on dialysis (CMS/HHS-HCC) 02/13/2014   Hyperlipidemia 02/13/2014   H/O stroke without  residual deficits 02/13/2014   CAD, multiple vessel 02/13/2014    Social History   Socioeconomic History   Marital status: Single   Number of children: 2   Years of education: HS  Occupational History   Occupation: disabled  Tobacco Use   Smoking status: Every Day    Current packs/day: 0.00    Types: Cigarettes    Last attempt to quit: 02/19/2014    Years since quitting: 10.2   Smokeless tobacco: Never  Substance and Sexual Activity   Alcohol use: No    Alcohol/week: 0.0 standard drinks of alcohol   Drug use: No   Sexual activity: Never    Partners: Male    Birth control/protection: None  Social History Narrative   She has high school education.  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  She smoked less than a pack of cigarettes a day for 10 years but quit smoking in September, 2015. She does not use any alcohol.  She used to smoke some marijuana and to a few other recreational drugs in her youth.  She is single and has 2 healthy daughters.  She lives with her daughter in [place], [place]  but is now at [facility] in [place].   Social Drivers of Health   Food Insecurity: No Food Insecurity (05/18/2023)   Received [facility]   Hunger Vital Sign    Within the past 12 months, you worried that your food would run out before you got the money to buy more.: Never true    Within the past 12 months, the food you bought just didn't last and you didn't  have money to get more.: Never true  Transportation Needs: No Transportation Needs (05/18/2023)   Received from [facility] - Transportation    Lack of Transportation (Medical): No    Lack of Transportation (Non-Medical): No    Family History  Problem Relation Name Age of Onset   Diabetes type II Mother     High blood pressure (Hypertension) Mother     High blood pressure (Hypertension) Father     High blood pressure (Hypertension) Sister     No Known Problems Brother     Breast  cancer Maternal Aunt     Breast cancer Maternal Grandmother     Diabetes type II Maternal Grandmother     High blood pressure (Hypertension) Maternal Grandmother      Allergies:  Allergies  Allergen Reactions   Morphine  Hives    Has tolerated Dilaudid  in the past.   Levofloxacin Itching    Severe itching; prickly sensation   Adhesive Other (See Comments)   Adhesive Tape-Silicones Rash   Enalapril  Other (See Comments)    hallucinations   Latex Rash    Home meds: Current Outpatient Medications  Medication Instructions   APIXABAN  (ELIQUIS  ORAL) 2 tablets, Daily   hydrOXYzine  (ATARAX ) 50 mg, 3 times Daily PRN   levothyroxine  (SYNTHROID ) 88 MCG tablet No dose, route, or frequency recorded.   lidocaine -prilocaine  (EMLA ) cream 1.5 g, As needed   metoprolol  SUCCinate (TOPROL -XL) 25 mg, Oral, Daily   midodrine  (PROAMATINE ) 10 mg, Daily   pantoprazole  (PROTONIX ) 40 mg, 2 times Daily   predniSONE (DELTASONE) 5 mg, Oral, Daily, 6, 5, 4, 3, 2,1, off   rosuvastatin  (CRESTOR ) 20 mg, Daily     Active Meds:  acetaminophen   975 mg Oral TID   [START ON 05/31/2024] cefTRIAXone   1 g Intravenous Q24H   lactated ringers   1,000 mL Intravenous Once    Objective    Current Vital Signs 24h Vital Sign Ranges  T 36.2 C (97.1 F) Temp  Avg: 36.4 C (97.5 F)  Min: 36.2 C (97.1 F)  Max: 36.6 C (97.9 F)  BP 109/69 BP  Min: 109/69  Max: 109/69  HR 96 Pulse  Avg: 89  Min: 82  Max: 96  RR 17 Resp  Avg: 16.5  Min: 16  Max: 17  SaO2 99 %   SpO2  Avg: 98 %  Min: 97 %  Max: 99 %          Ins & Outs No intake/output data recorded. Current Shift:  No intake/output data recorded.    Physical Exam   Physical Exam Gen:   NAD CV:   RRR, no extra heart sounds, JVP Pulm:   normal WOB, CTAB Abd:   soft, NTND MSK:   Brawny colored bilateral legs with erythema to the shin Access: RIJ PC in place    Labs   Recent Labs  Lab 05/30/24 1505  WBC 4.9  HGB 12.6  PLT 140*    Recent Labs  Lab 05/30/24 1134  NA 143  K 4.9  CL 101  CO2 25  BUN 23*  CREATININE 6.2*  CALCIUM  9.1  ALKPHOS 73  GLUCOSE 84   Lab Results  Component Value Date   PTH 28 05/04/2014   CALCIUM  9.1 05/30/2024   CAION 1.10 (L) 04/22/2014   PHOS 3.2 05/04/2014   Lab Results  Component Value Date   IRON 15 (L) 03/06/2014   TIBC 282 03/06/2014   FERRITIN 675 (H) 03/06/2014  PCTSAT 5 (L) 03/06/2014    Assessment and Recommendations   [name] is a 63 y.o. female with PMH of ESRD on HD MWF through [facility], prior CVA, Afib, Bipolar disorder, CHF, COPD, gout, CAD with NSTEMI in 2015 with CABG, pHTN,  presents with bilateral leg pain. Nephrology is consulted for maintenance HD.   [facility]KD on HD MWF Continue routine HD Will need verification of prescription from home unit in the AM Duration: 3.5 hours  BFR: 450 ml/min DFR: 800 ml/min UF: 2 L AC: none Nursing dialysate protocol No acute indications, will plan on doing dialysis 05/31/2024 Consent signed and in HD unit  Midodrine  10mg  30 minutes prior to dialysis  CKD Mineral and Bone Disorder (MBD) Phos goal 3.5-5.5 mg/dL per K/DOQI guidelines Check phosphate on dialysis days  Anemia Hgb at goal  Access No active issues  Standard Recommendations Daily standing weights Daily renal function panel (RFP) Please dose all medications for reduced eGFR  Avoid NSAIDs, morphine , and baclofen. Avoid magnesium-containing laxatives (milk of magnesia, magnesium hydroxide) and enemas (SMOG). Avoid phosphate-containing laxative (sodium phosphate ) enemas (Fleet phospho-soda). Use caution when administering iodinated contrast with eGFR < 30 Please monitor intake and urine output. Primary team should consider removing Foley if appropriate and present. If removed, it is recommended to utilize non invasive means of urine quantification and to follow the [facility] Urinary Retention Algorithm (attachment 3)  Thank you for this consult.  We will continue to follow the patient with you.  Please feel free to call with questions or concerns.   NORLEEN PATSY KINGSLEY, MD  Maintenance service pager [phone]   The Nephrology Consult team is available in-house from 7A-5P. If urgent assistance is needed outside of these hours, please page the Nephrology on-call pager [phone] for assistance. The Nephrology consult pagers are available after business hours for emergencies only.    ------------------------------------------------------------------------------- Attestation signed by Forrest Mooring, MD at 05/31/2024 12:26 PM Attestation Statement:   I personally saw and evaluated the patient, and participated in the management and treatment plan as documented in the resident/fellow note.  The a patient who has been on dialysis for 14 years presenting with bilateral extremity erythema and pain. Vascular surgery had been following along in outpatient setting and general surgery have been consulted in the ED. She reports significant pain.   Her last dialysis was on Monday. We will plan for dialysis today with 3.5 L UF   MOORING FORREST, MD  ------------------------------------------------------------------------------- "

## 2024-05-30 NOTE — ED Notes (Signed)
 Verbal report attempted to be called x 2. Talked with charge RN for 2124. Gave primary RN phone number. No answer. ED to inpatient hand off in. Transport requested.

## 2024-05-30 NOTE — ED Notes (Addendum)
 Patient transported to Ultrasound. Pt declined Vital sign reassessment at this time.

## 2024-05-30 NOTE — ED Notes (Addendum)
 Pt reported to ED for lower extremity pain x multiple months. Erythema, warm to touch, and edema noted to LLE. Pt flinched in pain with light touch to LLE. RLE noted to be cool to touch.

## 2024-05-30 NOTE — ED Provider Notes (Signed)
 2:57 PM  Assumed care of patient from off-going team. For more details, please see note from same day.  HPI from primary note: [name] is a 63 year old female who presents with a painful, discolored right foot and leg.   She has had progressive right foot pain and discoloration for eight weeks, initially limited to the foot and in the past few days extending up to the knee. The foot is purple and painful to touch. About two months ago a vascular doctor told her circulation was good, but two weeks later another physician noted poor circulation and a cold foot. The pain has markedly worsened over the past month, leading her to seek care. She started Keflex  last week for presumed infection and has three days remaining, but the pain and discoloration have continued to spread up the leg. She denies chest pain, shortness of breath, nausea, vomiting, or fever. She does not have diabetes and does not use a pacemaker.  ED Course as of 05/30/24 1816  Tue May 30, 2024  1217 Creatinine(!): 6.2 Mild Aoc aki [SP]  1217 C Reactive Protein: 0.81 [SP]  1313 X-ray foot left 3 plus views FINDINGS/IMPRESSION:  Diffusely decreased bone density. No acute osseous abnormality.  Moderate hallux valgus with metatarsus primus varus and sesamoid lateralization.  No dislocation. Mild multifocal joint space loss. Small plantar calcaneal enthesophyte.    [SP]  1446 Signed out pending admit v CEU for continued monitoring.  [SP]  1459 I assumed care on 05/30/2024 at 2:59 PM at shift change. Per verbal report from the previous care team:  Progressively worsening redness/warmth to LLE over the last 2 months. Ulcerative lesion to top of L foot that does not probe to bone. X-ray did not show bony involvement. Streaking up to thigh. No crepitus. Low concern for nec fasc. Has been on keflex  since 05/23/2024. Vascular - poor circulation.   Pending  - ESR/CRP - U/S - ?CEU for cellulitis vs ?gen med - ?consult  vascular surgery - Bactrim (for PO MRSA coverage) [AL]  1533 Lactate, Blood: 1.6 Lactate within normal limits. Patient is not currently in shock. [AL]  1534 C Reactive Protein: 0.81 Lower suspicion for systemic infectious or inflammatory process. [AL]  1535 Creatinine(!): 6.2 Slightly elevated compared to prior.  Has known history of CKD.  Otherwise, CMP does not show any electrolyte derangement, transaminitis, hyperbilirubinemia requiring emergent management. [AL]  1537 X-ray foot left 3 plus views Diffusely decreased bone density. No acute osseous abnormality. Moderate hallux valgus with metatarsus primus varus and sesamoid lateralization. No dislocation. Mild multifocal joint space loss. Small plantar calcaneal enthesophyte. I personally interpreted this and agree with the findings. Lower suspicion for osteomyelitis. [AL]  1538 ECG 12-lead Probable Atrial flutter with premature ventricular or aberrantly conducted complexes  The poor quality of the tracing precludes definitive diagnosis of the rhythm  Indeterminate axis  Right bundle branch block  Marked QRS Prolongation  Compared to EKG from 08/27/2023, sinus rhythm no longer present difficult to determine with the EKG quality. I personally interpreted this and agree with the findings.  [AL]  1541 Evaluating prior OSH note from podiatry (Dr. Thresa Sar) from 05/19/24, he states:  The findings on MRI do not correlate with the patient's symptoms and pain that she is experiencing. Very concerning for critical limb ischemia especially with the increased discoloration and distention of the leg and waxy skin appearance. Severe pain with light touch. It is now extended proximal to the level of the knee.  Due to the progressive nature of her symptoms concerning for possible limb loss. This is likely contributing to patient's symptoms today. Will consult vascular surgery. [AL]  1641 Sedimentation Rate-Automated: 3 Sedimentation rate within normal  limits. Lower concern for infectious or inflammatory process. [AL]  1641 WBC: 4.9 CBC shows no anemia, leukocytosis, or thrombocytopenia requiring emergent management at this time.  Lower suspicion for systemic infectious process. [AL]  1656 Consulted Vascular ([facility]) and they recommended CT A/P with run off, admit to gen med with vascular consulted, ABIs, toe pressures, consult nephrology for dialysis (M/W/F schedule), and IV antibiotics (CTX ordered). [AL]  1718 US  lower extremity venous left Examination is limited as patient was unable to tolerate compression views of the proximal thigh, and by poor beam echo penetrance in the calf. Within these constraints, the following impression is made: 1. No deep vein thrombosis identified in the left common femoral, femoral, and popliteal veins. 2. No definite deep vein thrombosis identified in the visualized portions of the left posterior tibial and peroneal veins. I personally interpreted this and agree with the findings. [AL]  1747 Per vascular ([facility]), patient's graft is no longer usable and blood pressures can be taken on right arm. [AL]  1809 Consulted general medicine and admitted to  [AL]    ED Course User Index [AL] [name], [name] [SP] [name], [name]   Disposition: The workup, evaluation, and plan for admission was reviewed with the patient. The patient's history, presentation, exam, and workup are concerning for peripheral artery disease (severe) and cellulitis and the patient has been accepted for admission to general medicine. All questions answered to the best of my ability. They expressed understanding and are comfortable with the plan. At the time of admission, patient was hemodynamically stable and in good condition.    [name], [name] 05/30/24 [phone]

## 2024-05-30 NOTE — ED Triage Notes (Signed)
 Patient is here for foot pain. Patient reports increased swelling and pain for the past months now. Recently, left leg swelling has been increasing. Multiple sores around the Left foot. Warm to touch.   Dialysis patient MWF  Glascow Coma Scale 4 - Opens eyes on own 6 - Follows simple motor commands 5 - Alert and oriented GCS Range: 13-15

## 2024-05-30 NOTE — ED Notes (Signed)
 Pt provided with blanket

## 2024-05-30 NOTE — ED Notes (Signed)
Vascular providers at bedside

## 2024-05-30 NOTE — ED Notes (Signed)
 Pt returned from U/S. Even and unlabored respirations noted.

## 2024-05-30 NOTE — ED Notes (Addendum)
 Providers at bedside performing U/S of lower extremities.

## 2024-05-30 NOTE — ED Notes (Signed)
 U/S trained medic at bedside to obtain PIV. This RN attempt was unsuccessful.

## 2024-05-30 NOTE — Progress Notes (Signed)
 [facility] Emergency Department Nursing Handoff    I - Illness Severity:   Patient is being admitted as Inpatient to Internal Medicine  [name], A. The primary encounter diagnosis was Dialysis patient (). A diagnosis of PAD (peripheral artery disease) was also pertinent to this visit. First Call Provider is [name], A.  Code Status: Full Code Isolation Status:Contact Telemetry: No  -   Safety Concern: Falls Risk  Family/Friend at Bedside? yes  Weight:   Weight Method:    Mobility: ambulates with assistance  Communication Barriers: None Identified  Transition orders: No   P- Patient Summary:  Chief Complaint  Patient presents with   Leg Swelling   Past Medical History:  Diagnosis Date   Angina pectoris (CMS-HCC)    Behavioral problems    Behavioral issues in the setting of frontal lobe CVA.    Cardiomyopathy (CMS/HHS-HCC)    CHF (congestive heart failure) (CMS/HHS-HCC)    Chicken pox    Coronary disease    End stage renal disease (CMS/HHS-HCC)    on dialysis   GERD (gastroesophageal reflux disease)    History of bipolar disorder    Hypertension    Myocardial infarction (CMS/HHS-HCC) 12/13   with cardiac stenting   Obesity    PVD (peripheral vascular disease) (CMS-HCC)    Smoking    Stroke (CMS/HHS-HCC) 2011   Substance abuse (CMS/HHS-HCC)    Nonadherence, positive cocaine test 2012.   Superficial incisional surgical site infection 03/14/2014   Past Surgical History:  Procedure Laterality Date   EGD  02/17/2013   (dysphagia) LA grade C reflux esophagitis, biopsied, dilated.   CORONARY ARTERY BYPASS W/SINGLE ARTERY GRAFT N/A 02/20/2014   Procedure: CORONARY ARTERY BYPASS W/SINGLE ARTERY GRAFT, Left internal mammary artery harvest;  Surgeon: Reyes Renella Fruits, MD;  Location: [facility];  Service: Cardiothoracic;  Laterality: N/A;   CORONARY ARTERY BYPASS W/VENOUS & ARTERIAL GRAFTS N/A 02/20/2014   Procedure: CORONARY ARTERY BYPASS W/VENOUS &  ARTERIAL GRAFTS;  Surgeon: Reyes Renella Fruits, MD;  Location: [facility];  Service: Cardiothoracic;  Laterality: N/A;   TRANSESOPHAGEAL ECHOCARDIOGRAPHY N/A 02/20/2014   Procedure: TRANSESOPHAGEAL ECHOCARDIOGRAPHY;  Surgeon: Reyes Renella Fruits, MD;  Location: [facility];  Service: Cardiothoracic;  Laterality: N/A;   PERCUTANEOUS INSERTION INTRA-AORTIC BALLOON CATH Right 02/20/2014   Procedure: PERCUTANEOUS INSERTION INTRA-AORTIC BALLOON CATH;  Surgeon: Reyes Renella Fruits, MD;  Location: [facility];  Service: Cardiothoracic;  Laterality: Right;   ULTRASOUND-GUIDED PERICARDIOCENTESIS  04/13/2014       THORACOSCOPY WITH CREATION PERICARDIAL WINDOW Left 04/20/2014   Procedure: THORACOSCOPY W/CREATION PERICARDIAL WINDOW;  Surgeon: Alm Helayne Starlin Mickey., MD;  Location: [facility];  Service: Cardiothoracic;  Laterality: Left;  possible thoracotomy   COLONOSCOPY  02/23/2019   Hyperplastic colon polyp/Abnormal Cologaurd/Repeat 2 to 3 yrs/MUS   COLONOSCOPY  04/22/2021   Poor colon prep/Stool present/Repeat next available/CTL Scheduled for 07/22/2021   EGD  04/22/2021   Gastritis/Hx Cirrhosis/Repeat 31yrs/CTL   CARDIAC SURGERY  April 2015, Feb 15, 2014   PCI   Status post angioplasty     with stent placement   Status post shunt placement      Assessment Assessment significant for: Neuro, Respiratory, and Cardiac (see flowsheet for additional assessment details) Assessments (last filed documentation for the encounter) Adult (>=13yo)   Neuro: Glasgow Coma Scale Eye Opening: Spontaneous (05/30/24 1200) Best Verbal Response: Oriented (05/30/24 1200) Best Motor Response: Obeys commands (05/30/24 1200) Glasgow Coma Scale Score: 15 (05/30/24 1200) Richmond Agitation Sedation Scale Richmond Agitation  Sedation Scale %%: 0 Alert and calm (05/30/24 1914) Neurological Neuro (WDL): Exceptions to WDL (05/30/24 1200) Level of Consciousness: Alert (05/30/24  1200) Orientation Level: Oriented X4 (05/30/24 1200) Cognition: Appropriate judgement;Appropriate safety awareness;Appropriate attention/concentration (05/30/24 1200) Comprehension: Follows simple commands (05/30/24 1200) Communication: Verbal (05/30/24 1200) Speech: Clear (05/30/24 1200) Facial weakness: Intact (05/30/24 1200) Facial Sensation (Cranial Nerve V): Intact (05/30/24 1200) Motor Function/Sensation Assessment: Motor response/strength, Sensation (05/30/24 1200)      Motor Response RUE: Follows commands;Purposeful movement (05/30/24 1200)      Sensation RUE: Normal (05/30/24 1200)      Motor Strength RUE: Normal power (05/30/24 1200)      Motor Response LUE: Follows commands;Purposeful movement (05/30/24 1200)      Sensation LUE: Normal (05/30/24 1200)      Motor Strength LUE: Normal power (05/30/24 1200)      Motor Response RLE: Follows commands;Purposeful movement (05/30/24 1200)      Sensation RLE: Pain;Present but diminished (Cool to touch) (05/30/24 1200)      Motor Strength RLE: Can overcome resistance (05/30/24 1200)      Motor Response LLE: Follows commands;Purposeful movement (05/30/24 1200)      Sensation LLE: Pain;Present but diminished (Erythema, Swelling) (05/30/24 1200)      Motor Strength LLE: Can overcome resistance (05/30/24 1200) Cerebellar Function: Intact (05/30/24 1200) Neglect: Not present (05/30/24 1200) HEENT:   Respiratory: Respiratory Respiratory (WDL): Exceptions to WDL (05/30/24 1200) Respiratory Pattern:  (Even and unlabored) (05/30/24 1200) Chest Assessment:  (Equal chest rise and fall) (05/30/24 1200) Cough/Sputum Description Cough: None (05/30/24 1200) Cardiac: Cardiovascular Cardiovascular  (WDL): Exceptions to WDL (05/30/24 1200) Cardiac Regularity: Regular (05/30/24 1817) Cardiac Rhythm: Normal sinus rhythm (05/30/24 1817) Cardiac Symptoms: Asymptomatic (05/30/24 1200) Pulses: R radial;L radial;R posterior tibial;L posterior tibial;R  pedal;L pedal (05/30/24 1200)      R Radial Pulse: Moderate (05/30/24 1200)      L Radial Pulse: Moderate (05/30/24 1200)      R Posterior Tibial    Pulse: Weak (05/30/24 1200)      L Posterior Tibial    Pulse: Doppler signal strong (05/30/24 1200)      R Pedal Pulse: Weak (05/30/24 1200)      L Pedal Pulse: Weak (05/30/24 1200) Capillary Refill: Right Lower Extremity;Brisk - Less than or equal to 2 seconds - bilateral UEs (UTA LLE due to pain) (05/30/24 1200)      R Toes Capillary Refill: (Sluggish) Greater than 3 seconds (05/30/24 1200)  GI:   GU:   Musculoskeletal: Neurovascular Assessment Pulses: R radial;L radial;R posterior tibial;L posterior tibial;R pedal;L pedal (05/30/24 1200)      R Radial Pulse: Moderate (05/30/24 1200)      L Radial Pulse: Moderate (05/30/24 1200)      R Posterior Tibial    Pulse: Weak (05/30/24 1200)      L Posterior Tibial    Pulse: Doppler signal strong (05/30/24 1200)      R Pedal Pulse: Weak (05/30/24 1200)      L Pedal Pulse: Weak (05/30/24 1200)      R Toes Capillary Refill: (Sluggish) Greater than 3 seconds (05/30/24 1200) Motor Function/Sensation Assessment: Motor response/strength, Sensation (05/30/24 1200)      Motor Response RUE: Follows commands;Purposeful movement (05/30/24 1200)      Sensation RUE: Normal (05/30/24 1200)      Motor Strength RUE: Normal power (05/30/24 1200)      Motor Response LUE: Follows commands;Purposeful movement (05/30/24 1200)  Sensation LUE: Normal (05/30/24 1200)      Motor Strength LUE: Normal power (05/30/24 1200)      Motor Response RLE: Follows commands;Purposeful movement (05/30/24 1200)      Sensation RLE: Pain;Present but diminished (Cool to touch) (05/30/24 1200)      Motor Strength RLE: Can overcome resistance (05/30/24 1200)      Motor Response LLE: Follows commands;Purposeful movement (05/30/24 1200)      Sensation LLE: Pain;Present but diminished (Erythema, Swelling) (05/30/24 1200)      Motor  Strength LLE: Can overcome resistance (05/30/24 1200) Stroke:     Skin: Obvious skin breakdown noted in ED: No  Pt voided in the ED: Yes   Patient is oriented to person, oriented to place, oriented to time, and oriented to situation.  Last Vital Signs: T 36.2 C (97.1 F) (05/30/24 1634)  BP 109/69 (05/30/24 0944)  HR 96 (05/30/24 1634)  RR 17 (05/30/24 1634)  O2sat 99 %        Pain 10-Worst pain ever (05/30/24 1914)   LDA: Peripheral IV 05/30/24 Right Basilic;Forearm (0)  Pain: Last medicated at [facility].   Medications/IV Fluids currently infusing:   A - Action List:   Important Nursing handoff items:abnormal labs, abnormal assessments, and abnormal imaging  S- Synthesis by Receiver:  For questions or clarification from the ED Nurse please call [phone] and ask for [facility]  ED Arrival: 05/30/2024 [phone]

## 2024-06-01 NOTE — Progress Notes (Signed)
 "  Nephrology Progress Note: [facility]  History of Present Illness   [name] is a 63 y.o. female with PMH of ESRD on HD MWF through [facility], prior CVA, Afib, Bipolar disorder, CHF, COPD, gout, CAD with NSTEMI in 2015 with CABG, pHTN,  presents with bilateral leg pain. Nephrology is consulted for maintenance HD.   Interval hx: Pt last HD session was on 12/17 with UF of 3L.  Pt still c/o of having alot of pain in her bilateral lower extremities.  Pt is s/p CT abdominal angiogram on yesterday.  Vascular surgery has been consulted. Primary team concerned with possible early calciphylaxis advised to consult dermatology.    ROS  The review of systems was negative unless otherwise stated in the HPI.   Past Medical History   Patient Active Problem List   Diagnosis Date Noted   Bilateral leg pain 05/30/2024   Hypothyroidism 09/18/2021   Paroxysmal atrial fibrillation (CMS/HHS-HCC) 09/18/2021   Acute colitis, unspecified 02/01/2021   Subdural hematoma (CMS-HCC) 07/25/2020   Non-STEMI (non-ST elevated myocardial infarction) (CMS/HHS-HCC) 08/07/2018   Pneumonia 01/31/2018   H/O atrial flutter 04/12/2014   History of delirium 04/12/2014   S/P CABG (coronary artery bypass graft) 04/02/2014   Arteriosclerotic dementia, with behavioral disturbance (CMS-HCC) 03/25/2014   Atrial flutter (CMS/HHS-HCC) 03/22/2014   Obesity 02/27/2014   H/O: substance abuse (CMS-HCC) 02/27/2014   Anemia of chronic renal failure 02/27/2014   Dialysis patient () 02/21/2014   HFrEF (heart failure with reduced ejection fraction) (CMS/HHS-HCC) 02/13/2014   History of hepatitis C 02/13/2014   End stage renal disease with hypertension (CMS/HHS-HCC) 02/13/2014   ESRD (end stage renal disease) on dialysis (CMS/HHS-HCC) 02/13/2014   Hyperlipidemia 02/13/2014   H/O stroke without residual deficits 02/13/2014   CAD, multiple vessel 02/13/2014    Social History   Socioeconomic History    Marital status: Single   Number of children: 2   Years of education: HS  Occupational History   Occupation: disabled  Tobacco Use   Smoking status: Every Day    Current packs/day: 0.00    Types: Cigarettes    Last attempt to quit: 02/19/2014    Years since quitting: 10.2   Smokeless tobacco: Never  Vaping Use   Vaping status: Never Used  Substance and Sexual Activity   Alcohol use: No    Alcohol/week: 0.0 standard drinks of alcohol   Drug use: No   Sexual activity: Never    Partners: Male    Birth control/protection: None  Social History Narrative   She has high school education.  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  She smoked less than a pack of cigarettes a day for 10 years but quit smoking in September, 2015. She does not use any alcohol.  She used to smoke some marijuana and to a few other recreational drugs in her youth.  She is single and has 2 healthy daughters.  She lives with her daughter in [place], [place]  but is now at [facility] in [place].   Social Drivers of Health   Financial Resource Strain: Low Risk  (05/31/2024)   Overall Financial Resource Strain (CARDIA)    Difficulty of Paying Living Expenses: Not hard at all  Food Insecurity: No Food Insecurity (05/31/2024)   Hunger Vital Sign    Worried About Running Out of Food in the Last Year: Never true    Ran Out of Food in the Last Year: Never true  Transportation Needs: No Transportation  Needs (05/31/2024)   PRAPARE - Administrator, Civil Service (Medical): No    Lack of Transportation (Non-Medical): No  Housing Stability: Low Risk  (05/31/2024)   Housing Stability Vital Sign    Unable to Pay for Housing in the Last Year: No    Number of Times Moved in the Last Year: 0    Homeless in the Last Year: No    Family History  Problem Relation Name Age of Onset   Diabetes type II Mother     High blood pressure (Hypertension) Mother     High blood pressure  (Hypertension) Father     High blood pressure (Hypertension) Sister     No Known Problems Brother     Breast cancer Maternal Aunt     Breast cancer Maternal Grandmother     Diabetes type II Maternal Grandmother     High blood pressure (Hypertension) Maternal Grandmother      Allergies:  Allergies  Allergen Reactions   Morphine  Hives    Has tolerated Dilaudid  in the past.   Levofloxacin Itching    Severe itching; prickly sensation   Adhesive Other (See Comments)   Adhesive Tape-Silicones Rash   Enalapril  Other (See Comments)    hallucinations   Latex Rash    Home meds: Current Outpatient Medications  Medication Instructions   APIXABAN  (ELIQUIS  ORAL) 2 tablets, Daily   hydrOXYzine  (ATARAX ) 50 mg, 3 times Daily PRN   levothyroxine  (SYNTHROID ) 88 MCG tablet No dose, route, or frequency recorded.   lidocaine -prilocaine  (EMLA ) cream 1.5 g, As needed   metoprolol  SUCCinate (TOPROL -XL) 25 mg, Oral, Daily   midodrine  (PROAMATINE ) 10 mg, Daily   pantoprazole  (PROTONIX ) 40 mg, 2 times Daily   predniSONE (DELTASONE) 5 mg, Oral, Daily, 6, 5, 4, 3, 2,1, off   rosuvastatin  (CRESTOR ) 20 mg, Daily     Active Meds:  acetaminophen   975 mg Oral TID   [Held by provider] apixaban   5 mg Oral Q12H SCH   heparin  (porcine)   Intracatheter As Directed Admin   heparin  (porcine)  5,000 Units Subcutaneous Q8H [facility]   levothyroxine   75 mcg Oral Daily before breakfast (0630)   metoprolol  SUCCinate  25 mg Oral Daily   midodrine   10 mg Oral Dialysis   pantoprazole   40 mg Oral Daily   rosuvastatin   20 mg Oral Daily   sennosides-docusate  2 tablet Oral BID    Objective    Current Vital Signs 24h Vital Sign Ranges  T 36.6 C (97.8 F) Temp  Avg: 36.5 C (97.7 F)  Min: 36.3 C (97.3 F)  Max: 36.6 C (97.9 F)  BP (!) 136/109 BP  Min: 73/46  Max: 146/129  HR 95 Pulse  Avg: 92  Min: 90  Max: 95  RR 18 Resp  Avg: 17.1  Min: 16  Max: 20  SaO2 94 % Nasal cannula SpO2   Avg: 98.9 %  Min: 94 %  Max: 100 %          Ins & Outs I/O last 2 completed shifts: In: 565.8 [IV Piggyback:565.8] Out: 3000 [Other:3000] Current Shift:  No intake/output data recorded.    Physical Exam   Physical Exam Gen:   NAD CV:   RRR, no extra heart sounds, JVP Pulm:   normal WOB, CTAB Abd:   soft, NTND MSK:   Brawny colored bilateral legs with erythema to the shin Access: RIJ PC in place    Labs   Recent Labs  Lab  05/30/24 1505 05/31/24 0718 06/01/24 0559  WBC 4.9 5.3 5.8  HGB 12.6 12.8 12.0  PLT 140* 138* 99*   Recent Labs  Lab 05/30/24 1134 05/31/24 0718 06/01/24 0559  NA 143 141 139  K 4.9 4.7 4.0  CL 101 99 100  CO2 25 20* 20*  BUN 23* 28* 15  CREATININE 6.2* 7.0* 5.1*  CALCIUM  9.1 9.3 9.0  ALKPHOS 73  --   --   GLUCOSE 84 59* 79   Lab Results  Component Value Date   PTH 132 (H) 06/01/2024   CALCIUM  9.0 06/01/2024   CAION 1.10 (L) 04/22/2014   PHOS 4.3 06/01/2024   Lab Results  Component Value Date   IRON 15 (L) 03/06/2014   TIBC 282 03/06/2014   FERRITIN 675 (H) 03/06/2014   PCTSAT 5 (L) 03/06/2014    Assessment and Recommendations   [name] is a 63 y.o. female with PMH of ESRD on HD MWF through [facility], prior CVA, Afib, Bipolar disorder, CHF, COPD, gout, CAD with NSTEMI in 2015 with CABG, pHTN,  presents with bilateral leg pain. Nephrology is consulted for maintenance HD.   ESKD on HD MWF Continue routine HD Will need verification of prescription from home unit in the AM Duration: 3.5 hours  BFR: 450 ml/min DFR: 800 ml/min UF: 2 L AC: none Nursing dialysate protocol No acute indications, will plan on doing dialysis 06/01/2024 Consent signed and in HD unit  Midodrine  10mg  30 minutes prior to dialysis  CKD Mineral and Bone Disorder (MBD) Phos goal 3.5-5.5 mg/dL per K/DOQI guidelines Check phosphate on dialysis days  Anemia Hgb at goal  Access No active issues  Standard Recommendations Daily standing  weights Daily renal function panel (RFP) Please dose all medications for reduced eGFR  Avoid NSAIDs, morphine , and baclofen. Avoid magnesium-containing laxatives (milk of magnesia, magnesium hydroxide) and enemas (SMOG). Avoid phosphate-containing laxative (sodium phosphate ) enemas (Fleet phospho-soda). Use caution when administering iodinated contrast with eGFR < 30 Please monitor intake and urine output. Primary team should consider removing Foley if appropriate and present. If removed, it is recommended to utilize non invasive means of urine quantification and to follow the [facility] Urinary Retention Algorithm (attachment 3)  Thank you for this consult. We will continue to follow the patient with you.  Please feel free to call with questions or concerns.   Vanessa DENAIE WILLIAMS, NP  Maintenance service pager [phone]   The Nephrology Consult team is available in-house from 7A-5P. If urgent assistance is needed outside of these hours, please page the Nephrology on-call pager [phone] for assistance. The Nephrology consult pagers are available after business hours for emergencies only.    ------------------------------------------------------------------------------- Attestation signed by Forrest Mooring, MD at 06/01/2024  2:45 PM Attestation Statement:   I personally saw the patient and performed a substantive portion of the medical decision making, in conjunction with the Advanced Practice Provider for the condition/treatment of ESRD on dialysis.  Continues to have significant lower extremity pain and erythema. Dermatology consulted today with unclear exact etiology  Planning for dialysis tomorrow   MOORING FORREST, MD  ------------------------------------------------------------------------------- "

## 2024-06-02 NOTE — Care Plan (Signed)
 [name] is a 63 y.o. female with PMH of ESRD on HD MWF through [facility], prior CVA, Afib, Bipolar disorder, CHF, COPD, gout, CAD with NSTEMI in 2015 with CABG, pHTN, presents with bilateral leg pain. Dermatology consulted for bilateral LE erythema.   It is difficult to pinpoint what could be the cause of the patient's exquisite pain. She has some venous insufficiency, although it would be atypical for venous insufficiency alone to cause this degree of pain. Erythromelalgia is considered, however would not expect this extensive involvement and would expect more episodic involvement with certain triggers. Finally, fibrosing dermopathies such as scleredema, scleromyxedema, eosinophilic fasciitis, and lipodermatosclerosis were considered as causes of persistent pain although clinical exam and history are inconsistent. Notably, her examination is not consistent with calciphylaxis. She does have significant erythema extending to the knees which would also be inconsistent with the above diagnoses.   Neurology was consulted on 12/19 with suspicion for small fiber neuropathy. Treatment initiated 12/19.   Recommendations - Continue treatment for stasis dermatitis with clobetasol cream or ointment twice daily. Avoid applying to feet. Stop after 1 week, then change to triamcinolone  cream/ointment twice daily as needed/tolerated to itchy areas  - Continue clotrimazole cream twice daily for tinea pedis   - Encourage leg elevation as much as possible   Derm will sign off with PRN outpatient follow up. Please page dermatology if new or changing skin lesions develop for repeat evaluation.  Ray Agent, MD PGY3 Dermatology

## 2024-06-02 NOTE — Progress Notes (Signed)
 Hemodialysis Progress Note:  [name] is seen during hemodialysis at 9:15 AM  Dermatology consulted yesterday and have low suspicion for calciphylaxis Primary team planning on consulting neuro for neuropathy eval   Outpatient Dialysis Center: (Not currently on dialysis)  Objective:  Vitals:   06/02/24 0858  BP: 113/78  Pulse:   Resp:   Temp:    Lungs: clear to auscultation CV: Regular rate and rhythm Extremities: trace ankle edema with marked erythema and tenderness on palpation  Dialysis Treatment:   Current Hemodialysis Prescription: Dialyzer: Revaclear Dialysis Bath: 2K with 2.5Ca Na: Sodium: 140 HCO3: Bicarbonate: 35 Dialysis Access:   BFR: Blood Flow Rate (+/- 50 mL/min): 400 DFR: Dialysate Flow Rate (mL/min): 800 mL/min Sodium modeling:   Heparin : Not ordered Pre-treatment weight:  Estimated Dry Weight:    After clinical assessment the ultrafiltration goal will be 2 liters.  Previous Hemodialysis Treatment Information: Last Post-HD weight:   Last treatment patient fluid removal:   Last Pre treatment BP:   Last Post treatment BP:    Assessment: [name] is tolerating hemodialysis.  Plan: Continue current dialysis treatment.  HARREL IHA, MD

## 2024-06-02 NOTE — Progress Notes (Signed)
 Hemodialysis Handoff Note  The patient received a hemodialysis treatment that lasted 3:30hours:minutes Patient's net fluid removal was 2 L.  Did the patient tolerate the treatment? Yes Was an intervention required? None  Post Dialysis Vitals:  HR: 90 Pulse: 89 BP: 92/57 Position: Lying Post-Hemodialysis Comments: BP soft. with low BP readings, patient oriented. UF net 2L reached. Blood Products Given:no Outstanding Dialysis Medications:n/a AVF/AVG Dressing Removal:n/a Report given to: Millard RN

## 2024-06-02 NOTE — Progress Notes (Signed)
 Case Management Progress Note   CM discussed pt's progress/DCP with MD.  Pt is hospital day 3.  CM sent referrals to SNF.  PASRR under manual review. Referral sent to [facility] team.   CM met with pt and provided pt with Choice list.  At time list was provided referrals has been sent out about 2 hours prior.  Pt had 2 bed offers.  CM informed pt of bed offers.  Pt wished to review list provided and states that [facility] would be first choice as pt has been there before.   CM placed call to [facility] @ [phone], CM left a VM for Montie in admissions requested referral be reviewed.   CM will need to follow up with pt on all bed offers and obtain choice.   CM will continue to follow and assist as needed.   ADRIEN POSEY, MSW Case Manager

## 2024-06-02 NOTE — Care Plan (Signed)
" °  Problem: All patients positive for delirium Goal: Patient will remain safe Outcome: Progressing   Problem: Disorientation Goal: The patient will be oriented to person, time, or place in the environment Outcome: Progressing   Problem: Illusions/Hallucinations Goal: The patient will no longer see or hear things that are not there, distortion of visual objects Outcome: Progressing   Problem: Psychomotor Delay Goal: The patient will no longer have delayed responsiveness, few or spontaneous actions/word e.g. when patient is prodded, reaction is deferred and/or the patient is unarousable Outcome: Progressing   Problem: Knowledge deficit: Goal: Patient/caregiver ability to state and carry out methods to decrease the pain will improve Description:      Outcome: Progressing Note: Patient satisfied with pain goal. Pain Intervention(s): Medication (IV push short acting opioid given)  Goal: Patient/caregiver ability to identify factors that increase the pain will improve Outcome: Progressing Note: Patient satisfied with pain goal. Pain Intervention(s): Medication (IV push short acting opioid given)    Problem: Altered ability to manage pain: Goal: Ability to develop a pain control plan will improve Outcome: Progressing Note: Patient satisfied with pain goal. Pain Intervention(s): Medication (IV push short acting opioid given)  Goal: Pain level will decrease Outcome: Progressing Note: Patient satisfied with pain goal. Pain Intervention(s): Medication (IV push short acting opioid given)  Goal: Satisfaction with pain management regimen will improve Outcome: Progressing Note: Patient satisfied with pain goal. Pain Intervention(s): Medication (IV push short acting opioid given)  Goal: Patient's ability to cope will be supported Outcome: Progressing Note: Patient satisfied with pain goal. Pain Intervention(s): Medication (IV push short acting opioid given)    Problem: Fall Risk  Prevention for Adult Populations Goal: Moderate Risk Fall Prevention - Adults Description: The patient will remain free from falls Outcome: Progressing Flowsheets (Taken 06/02/2024 1137 by Javierto, Myrvette Louie Marie, RN) Moderate Falls Risk Interventions:  Place yellow falls wrist band & post fall risk signage  Re-orient to falls prevention plan as needed & include personal risk factors  Utilize 3 side rails (lower rail closest to bathroom unless equipment/room/patient factor prohibits)  Reinforce calling for assistance before getting out of bed/chair  Implement toileting plan (strategy includes frequency, mobility, equipment)  Implement mobility plan (may include equipment & injury risk assessment)  Apply yellow non-skid socks  Collaborate on a safety plan with other disciplines (ex: PT/OT, Pharmacist, Provider, BRT/BHRT)  Use exit alarms (bed/stretcher, chair pad/lap belt)   "

## 2024-06-06 ENCOUNTER — Ambulatory Visit: Admitting: Family

## 2024-06-13 NOTE — Progress Notes (Addendum)
 [name]                                          MRN: [id number]   06/13/2024   The [facility] Quality Team Specialist reviewed this patient medical record for the purposes of chart review for care gap closure. The following were reviewed: abstraction for care gap closure-glycemic status assessment. RECHECKED FOR UPDATED LABS.  [name]                                          MRN: [id number]   07/06/2024   The [facility] Quality Team Specialist reviewed this patient medical record for the purposes of chart review for care gap closure. The following were reviewed: chart review for care gap closure-breast cancer screening.    [facility] Quality Team

## 2024-06-22 ENCOUNTER — Emergency Department

## 2024-06-22 ENCOUNTER — Inpatient Hospital Stay
Admission: EM | Admit: 2024-06-22 | Discharge: 2024-06-28 | DRG: 314 | Disposition: A | Discharge status: Home Health | Attending: Obstetrics and Gynecology | Admitting: Obstetrics and Gynecology

## 2024-06-22 ENCOUNTER — Telehealth: Payer: Self-pay

## 2024-06-22 DIAGNOSIS — E039 Hypothyroidism, unspecified: Secondary | ICD-10-CM | POA: Diagnosis present

## 2024-06-22 DIAGNOSIS — I214 Non-ST elevation (NSTEMI) myocardial infarction: Secondary | ICD-10-CM | POA: Diagnosis not present

## 2024-06-22 DIAGNOSIS — B1920 Unspecified viral hepatitis C without hepatic coma: Secondary | ICD-10-CM | POA: Diagnosis present

## 2024-06-22 DIAGNOSIS — F319 Bipolar disorder, unspecified: Secondary | ICD-10-CM | POA: Diagnosis present

## 2024-06-22 DIAGNOSIS — J449 Chronic obstructive pulmonary disease, unspecified: Secondary | ICD-10-CM | POA: Diagnosis present

## 2024-06-22 DIAGNOSIS — Z809 Family history of malignant neoplasm, unspecified: Secondary | ICD-10-CM

## 2024-06-22 DIAGNOSIS — Z803 Family history of malignant neoplasm of breast: Secondary | ICD-10-CM

## 2024-06-22 DIAGNOSIS — G894 Chronic pain syndrome: Secondary | ICD-10-CM | POA: Diagnosis present

## 2024-06-22 DIAGNOSIS — R578 Other shock: Secondary | ICD-10-CM | POA: Diagnosis present

## 2024-06-22 DIAGNOSIS — I2489 Other forms of acute ischemic heart disease: Secondary | ICD-10-CM | POA: Diagnosis present

## 2024-06-22 DIAGNOSIS — R55 Syncope and collapse: Secondary | ICD-10-CM | POA: Diagnosis present

## 2024-06-22 DIAGNOSIS — I081 Rheumatic disorders of both mitral and tricuspid valves: Secondary | ICD-10-CM | POA: Diagnosis present

## 2024-06-22 DIAGNOSIS — Z91158 Patient's noncompliance with renal dialysis for other reason: Secondary | ICD-10-CM

## 2024-06-22 DIAGNOSIS — I4901 Ventricular fibrillation: Secondary | ICD-10-CM | POA: Diagnosis not present

## 2024-06-22 DIAGNOSIS — Z7901 Long term (current) use of anticoagulants: Secondary | ICD-10-CM | POA: Diagnosis not present

## 2024-06-22 DIAGNOSIS — Z833 Family history of diabetes mellitus: Secondary | ICD-10-CM

## 2024-06-22 DIAGNOSIS — Z95828 Presence of other vascular implants and grafts: Secondary | ICD-10-CM | POA: Diagnosis not present

## 2024-06-22 DIAGNOSIS — K219 Gastro-esophageal reflux disease without esophagitis: Secondary | ICD-10-CM | POA: Diagnosis not present

## 2024-06-22 DIAGNOSIS — Z992 Dependence on renal dialysis: Secondary | ICD-10-CM | POA: Diagnosis not present

## 2024-06-22 DIAGNOSIS — I2510 Atherosclerotic heart disease of native coronary artery without angina pectoris: Secondary | ICD-10-CM | POA: Diagnosis present

## 2024-06-22 DIAGNOSIS — L97429 Non-pressure chronic ulcer of left heel and midfoot with unspecified severity: Secondary | ICD-10-CM | POA: Diagnosis present

## 2024-06-22 DIAGNOSIS — I9589 Other hypotension: Secondary | ICD-10-CM | POA: Diagnosis present

## 2024-06-22 DIAGNOSIS — R570 Cardiogenic shock: Secondary | ICD-10-CM | POA: Diagnosis present

## 2024-06-22 DIAGNOSIS — I272 Pulmonary hypertension, unspecified: Secondary | ICD-10-CM | POA: Diagnosis present

## 2024-06-22 DIAGNOSIS — Z515 Encounter for palliative care: Secondary | ICD-10-CM | POA: Diagnosis not present

## 2024-06-22 DIAGNOSIS — M47812 Spondylosis without myelopathy or radiculopathy, cervical region: Secondary | ICD-10-CM | POA: Diagnosis not present

## 2024-06-22 DIAGNOSIS — Z1152 Encounter for screening for COVID-19: Secondary | ICD-10-CM | POA: Diagnosis not present

## 2024-06-22 DIAGNOSIS — N179 Acute kidney failure, unspecified: Secondary | ICD-10-CM | POA: Diagnosis present

## 2024-06-22 DIAGNOSIS — Z91048 Other nonmedicinal substance allergy status: Secondary | ICD-10-CM

## 2024-06-22 DIAGNOSIS — E669 Obesity, unspecified: Secondary | ICD-10-CM | POA: Diagnosis present

## 2024-06-22 DIAGNOSIS — J9690 Respiratory failure, unspecified, unspecified whether with hypoxia or hypercapnia: Secondary | ICD-10-CM | POA: Diagnosis not present

## 2024-06-22 DIAGNOSIS — Z930 Tracheostomy status: Secondary | ICD-10-CM | POA: Diagnosis not present

## 2024-06-22 DIAGNOSIS — I700 Atherosclerosis of aorta: Secondary | ICD-10-CM | POA: Diagnosis present

## 2024-06-22 DIAGNOSIS — F17210 Nicotine dependence, cigarettes, uncomplicated: Secondary | ICD-10-CM | POA: Diagnosis present

## 2024-06-22 DIAGNOSIS — I5023 Acute on chronic systolic (congestive) heart failure: Secondary | ICD-10-CM | POA: Diagnosis present

## 2024-06-22 DIAGNOSIS — Z789 Other specified health status: Secondary | ICD-10-CM | POA: Diagnosis not present

## 2024-06-22 DIAGNOSIS — G928 Other toxic encephalopathy: Secondary | ICD-10-CM | POA: Diagnosis not present

## 2024-06-22 DIAGNOSIS — I480 Paroxysmal atrial fibrillation: Secondary | ICD-10-CM

## 2024-06-22 DIAGNOSIS — Z8419 Family history of other disorders of kidney and ureter: Secondary | ICD-10-CM

## 2024-06-22 DIAGNOSIS — Z8673 Personal history of transient ischemic attack (TIA), and cerebral infarction without residual deficits: Secondary | ICD-10-CM

## 2024-06-22 DIAGNOSIS — J9621 Acute and chronic respiratory failure with hypoxia: Secondary | ICD-10-CM | POA: Diagnosis present

## 2024-06-22 DIAGNOSIS — Z881 Allergy status to other antibiotic agents status: Secondary | ICD-10-CM

## 2024-06-22 DIAGNOSIS — D631 Anemia in chronic kidney disease: Secondary | ICD-10-CM | POA: Diagnosis present

## 2024-06-22 DIAGNOSIS — E875 Hyperkalemia: Secondary | ICD-10-CM | POA: Diagnosis not present

## 2024-06-22 DIAGNOSIS — I5082 Biventricular heart failure: Secondary | ICD-10-CM | POA: Diagnosis not present

## 2024-06-22 DIAGNOSIS — I959 Hypotension, unspecified: Secondary | ICD-10-CM | POA: Diagnosis not present

## 2024-06-22 DIAGNOSIS — I469 Cardiac arrest, cause unspecified: Secondary | ICD-10-CM | POA: Diagnosis not present

## 2024-06-22 DIAGNOSIS — E785 Hyperlipidemia, unspecified: Secondary | ICD-10-CM | POA: Diagnosis present

## 2024-06-22 DIAGNOSIS — Z885 Allergy status to narcotic agent status: Secondary | ICD-10-CM

## 2024-06-22 DIAGNOSIS — Z6826 Body mass index (BMI) 26.0-26.9, adult: Secondary | ICD-10-CM

## 2024-06-22 DIAGNOSIS — R0602 Shortness of breath: Secondary | ICD-10-CM

## 2024-06-22 DIAGNOSIS — I252 Old myocardial infarction: Secondary | ICD-10-CM

## 2024-06-22 DIAGNOSIS — Z91040 Latex allergy status: Secondary | ICD-10-CM

## 2024-06-22 DIAGNOSIS — Z79899 Other long term (current) drug therapy: Secondary | ICD-10-CM

## 2024-06-22 DIAGNOSIS — J9622 Acute and chronic respiratory failure with hypercapnia: Secondary | ICD-10-CM | POA: Diagnosis not present

## 2024-06-22 DIAGNOSIS — R112 Nausea with vomiting, unspecified: Secondary | ICD-10-CM | POA: Diagnosis not present

## 2024-06-22 DIAGNOSIS — Z8041 Family history of malignant neoplasm of ovary: Secondary | ICD-10-CM

## 2024-06-22 DIAGNOSIS — I255 Ischemic cardiomyopathy: Secondary | ICD-10-CM | POA: Diagnosis present

## 2024-06-22 DIAGNOSIS — I472 Ventricular tachycardia, unspecified: Secondary | ICD-10-CM | POA: Diagnosis not present

## 2024-06-22 DIAGNOSIS — M79672 Pain in left foot: Secondary | ICD-10-CM | POA: Diagnosis not present

## 2024-06-22 DIAGNOSIS — I5020 Unspecified systolic (congestive) heart failure: Secondary | ICD-10-CM

## 2024-06-22 DIAGNOSIS — M79661 Pain in right lower leg: Secondary | ICD-10-CM

## 2024-06-22 DIAGNOSIS — D696 Thrombocytopenia, unspecified: Secondary | ICD-10-CM | POA: Diagnosis present

## 2024-06-22 DIAGNOSIS — R6521 Severe sepsis with septic shock: Secondary | ICD-10-CM | POA: Diagnosis not present

## 2024-06-22 DIAGNOSIS — B954 Other streptococcus as the cause of diseases classified elsewhere: Secondary | ICD-10-CM | POA: Diagnosis not present

## 2024-06-22 DIAGNOSIS — R579 Shock, unspecified: Secondary | ICD-10-CM | POA: Diagnosis not present

## 2024-06-22 DIAGNOSIS — Z888 Allergy status to other drugs, medicaments and biological substances status: Secondary | ICD-10-CM

## 2024-06-22 DIAGNOSIS — I4510 Unspecified right bundle-branch block: Secondary | ICD-10-CM | POA: Diagnosis present

## 2024-06-22 DIAGNOSIS — E872 Acidosis, unspecified: Secondary | ICD-10-CM | POA: Diagnosis present

## 2024-06-22 DIAGNOSIS — L03116 Cellulitis of left lower limb: Secondary | ICD-10-CM

## 2024-06-22 DIAGNOSIS — Z79890 Hormone replacement therapy (postmenopausal): Secondary | ICD-10-CM

## 2024-06-22 DIAGNOSIS — M109 Gout, unspecified: Secondary | ICD-10-CM | POA: Diagnosis present

## 2024-06-22 DIAGNOSIS — I70223 Atherosclerosis of native arteries of extremities with rest pain, bilateral legs: Secondary | ICD-10-CM | POA: Diagnosis present

## 2024-06-22 DIAGNOSIS — B9562 Methicillin resistant Staphylococcus aureus infection as the cause of diseases classified elsewhere: Secondary | ICD-10-CM | POA: Diagnosis not present

## 2024-06-22 DIAGNOSIS — Z951 Presence of aortocoronary bypass graft: Secondary | ICD-10-CM

## 2024-06-22 DIAGNOSIS — I4891 Unspecified atrial fibrillation: Secondary | ICD-10-CM | POA: Diagnosis present

## 2024-06-22 DIAGNOSIS — R188 Other ascites: Secondary | ICD-10-CM | POA: Diagnosis not present

## 2024-06-22 DIAGNOSIS — N250 Renal osteodystrophy: Secondary | ICD-10-CM | POA: Diagnosis present

## 2024-06-22 DIAGNOSIS — Z9889 Other specified postprocedural states: Secondary | ICD-10-CM | POA: Diagnosis not present

## 2024-06-22 DIAGNOSIS — E8770 Fluid overload, unspecified: Secondary | ICD-10-CM | POA: Diagnosis not present

## 2024-06-22 DIAGNOSIS — Z7189 Other specified counseling: Secondary | ICD-10-CM | POA: Diagnosis not present

## 2024-06-22 DIAGNOSIS — I132 Hypertensive heart and chronic kidney disease with heart failure and with stage 5 chronic kidney disease, or end stage renal disease: Secondary | ICD-10-CM | POA: Diagnosis present

## 2024-06-22 DIAGNOSIS — R7881 Bacteremia: Secondary | ICD-10-CM | POA: Diagnosis not present

## 2024-06-22 DIAGNOSIS — Z22322 Carrier or suspected carrier of Methicillin resistant Staphylococcus aureus: Secondary | ICD-10-CM

## 2024-06-22 DIAGNOSIS — B955 Unspecified streptococcus as the cause of diseases classified elsewhere: Secondary | ICD-10-CM | POA: Diagnosis not present

## 2024-06-22 DIAGNOSIS — R7989 Other specified abnormal findings of blood chemistry: Secondary | ICD-10-CM | POA: Diagnosis not present

## 2024-06-22 DIAGNOSIS — M4012 Other secondary kyphosis, cervical region: Secondary | ICD-10-CM | POA: Diagnosis not present

## 2024-06-22 DIAGNOSIS — N186 End stage renal disease: Secondary | ICD-10-CM | POA: Diagnosis present

## 2024-06-22 DIAGNOSIS — Z955 Presence of coronary angioplasty implant and graft: Secondary | ICD-10-CM

## 2024-06-22 DIAGNOSIS — A419 Sepsis, unspecified organism: Secondary | ICD-10-CM | POA: Diagnosis not present

## 2024-06-22 DIAGNOSIS — M4642 Discitis, unspecified, cervical region: Secondary | ICD-10-CM | POA: Diagnosis not present

## 2024-06-22 DIAGNOSIS — Z8249 Family history of ischemic heart disease and other diseases of the circulatory system: Secondary | ICD-10-CM

## 2024-06-22 LAB — CBC
HCT: 43.6 % (ref 36.0–46.0)
Hemoglobin: 13.6 g/dL (ref 12.0–15.0)
MCH: 32 pg (ref 26.0–34.0)
MCHC: 31.2 g/dL (ref 30.0–36.0)
MCV: 102.6 fL — ABNORMAL HIGH (ref 80.0–100.0)
Platelets: 116 K/uL — ABNORMAL LOW (ref 150–400)
RBC: 4.25 MIL/uL (ref 3.87–5.11)
RDW: 19 % — ABNORMAL HIGH (ref 11.5–15.5)
WBC: 5.8 K/uL (ref 4.0–10.5)
nRBC: 0 % (ref 0.0–0.2)

## 2024-06-22 LAB — COMPREHENSIVE METABOLIC PANEL WITH GFR
ALT: 27 U/L (ref 0–44)
AST: 49 U/L — ABNORMAL HIGH (ref 15–41)
Albumin: 4 g/dL (ref 3.5–5.0)
Alkaline Phosphatase: 85 U/L (ref 38–126)
Anion gap: 22 — ABNORMAL HIGH (ref 5–15)
BUN: 35 mg/dL — ABNORMAL HIGH (ref 8–23)
CO2: 19 mmol/L — ABNORMAL LOW (ref 22–32)
Calcium: 9.8 mg/dL (ref 8.9–10.3)
Chloride: 96 mmol/L — ABNORMAL LOW (ref 98–111)
Creatinine, Ser: 6.26 mg/dL — ABNORMAL HIGH (ref 0.44–1.00)
GFR, Estimated: 7 mL/min — ABNORMAL LOW
Glucose, Bld: 90 mg/dL (ref 70–99)
Potassium: 4.8 mmol/L (ref 3.5–5.1)
Sodium: 137 mmol/L (ref 135–145)
Total Bilirubin: 0.7 mg/dL (ref 0.0–1.2)
Total Protein: 7.4 g/dL (ref 6.5–8.1)

## 2024-06-22 LAB — TROPONIN T, HIGH SENSITIVITY: Troponin T High Sensitivity: 154 ng/L (ref 0–19)

## 2024-06-22 LAB — RESP PANEL BY RT-PCR (RSV, FLU A&B, COVID)  RVPGX2
Influenza A by PCR: NEGATIVE
Influenza B by PCR: NEGATIVE
Resp Syncytial Virus by PCR: NEGATIVE
SARS Coronavirus 2 by RT PCR: NEGATIVE

## 2024-06-22 MED ORDER — SENNA 8.6 MG PO TABS
1.0000 | ORAL_TABLET | Freq: Two times a day (BID) | ORAL | Status: DC | PRN
  Administered 2024-06-25: 8.6 mg via ORAL
  Filled 2024-06-22: qty 1

## 2024-06-22 MED ORDER — POLYETHYLENE GLYCOL 3350 17 G PO PACK: 17.0000 g | PACK | Freq: Every day | ORAL | Status: DC | PRN

## 2024-06-22 MED ORDER — NOREPINEPHRINE 4 MG/250ML-% IV SOLN
0.0000 ug/min | INTRAVENOUS | Status: DC
  Administered 2024-06-22: 10 ug/min via INTRAVENOUS
  Administered 2024-06-23: 4 ug/min via INTRAVENOUS
  Administered 2024-06-23: 7 ug/min via INTRAVENOUS
  Filled 2024-06-22 (×3): qty 250

## 2024-06-22 MED ORDER — SODIUM CHLORIDE 0.9 % IV BOLUS
500.0000 mL | Freq: Once | INTRAVENOUS | Status: AC
  Administered 2024-06-22: 500 mL via INTRAVENOUS

## 2024-06-22 MED ORDER — CHLORHEXIDINE GLUCONATE CLOTH 2 % EX PADS: 6.0000 | MEDICATED_PAD | Freq: Every day | CUTANEOUS | Status: DC

## 2024-06-22 NOTE — ED Notes (Signed)
 ED Provider at bedside.

## 2024-06-22 NOTE — ED Notes (Signed)
 SBAR given to Abigail, CHARITY FUNDRAISER

## 2024-06-22 NOTE — ED Notes (Signed)
"  RT at bedside for ABG  "

## 2024-06-22 NOTE — ED Triage Notes (Signed)
 Pt BIB EMS for syncopal event. Pt states she woke up on the floor  I have no earthly idea what happened. Pt LKN at 1pm per her brother. Dialysis M, W, F, states she went for dialysis yesterday, but her BP was too low so they sent her home, states she is due for dialysis tomorrow morning. Also c/o nausea.

## 2024-06-22 NOTE — ED Provider Notes (Signed)
 "  [facility] Provider Note    Event Date/Time   First MD Initiated Contact with Patient 06/22/24 2108     (approximate)  History   Chief Complaint: Loss of Consciousness and Hypotension  HPI  [name] is a 64 y.o. female with a past medical history of of ESRD on HD Monday/Wednesday/Friday, COPD, CHF, bipolar, hypertension, hyperlipidemia, prior CVA, presents to the emergency department for possible syncopal event possible fall.  According to the patient she normally gets dialysis Monday, Wednesday, Friday, she went yesterday but was told that her blood pressure was too low to get dialysis.  Patient states today she may have passed out she is not sure but she fell to the ground possibly hit her head she states.  Patient states she has been very nauseated with multiple episodes of vomiting today and has felt generalized weak.  Hypotensive per EMS currently 71/44 in the emergency department.  Patient is awake alert she is mentating but states she feels very weak.  Denies any shortness of breath  Physical Exam   Triage Vital Signs: ED Triage Vitals  Encounter Vitals Group     BP --      Girls Systolic BP Percentile --      Girls Diastolic BP Percentile --      Boys Systolic BP Percentile --      Boys Diastolic BP Percentile --      Pulse --      Resp --      Temp --      Temp src --      SpO2 06/22/24 2105 98 %     Weight 06/22/24 2106 160 lb 7.9 oz (72.8 kg)     Height --      Head Circumference --      Peak Flow --      Pain Score --      Pain Loc --      Pain Education --      Exclude from Growth Chart --     Most recent vital signs: Vitals:   06/22/24 2105  SpO2: 98%    General: Awake, no distress.  Dried vomitus on her face.  No obvious signs of head trauma on exam. CV:  Good peripheral perfusion.  Regular rate and rhythm  Resp:  Normal effort.  Equal breath sounds bilaterally.  Right subclavian dialysis catheter present. Abd:  No  distention.  Soft, nontender.    ED Results / Procedures / Treatments   EKG  EKG viewed and interpreted by myself shows a sinus rhythm 81 bpm with a lightly widened QRS, normal axis, nonspecific ST changes with significant ectopy.  RADIOLOGY  I reviewed interpret the chest x-ray images.  Patient significant cardiomegaly some mild possible congestion or interstitial edema. Radiology has read the x-rays cardiomegaly with vascular congestion without edema.   MEDICATIONS ORDERED IN ED: Medications - No data to display   IMPRESSION / MDM / ASSESSMENT AND PLAN / ED COURSE  I reviewed the triage vital signs and the nursing notes.  Patient's presentation is most consistent with acute presentation with potential threat to life or bodily function.  Patient presents emergency department for generalized weakness and possible fall of her syncopal episode.  Patient states she is nauseated she has dried vomitus on her face.  Patient is hypotensive in the 70s.  Has not had dialysis since Monday however given the patient's hypertension we will IV hydrate 500 cc of IV fluids.  Will check labs including CBC chemistry will obtain cardiac enzymes and a chest x-ray.  Given the possible fall with possible head injury we will obtain CT imaging of the head and C-spine.  Will obtain a COVID/flu test and continue to closely monitor.  Will maintain on cardiac monitoring and obtain EKG.  Differential is quite broad but would include electrolyte or metabolic abnormality, dehydration, cardiac abnormality/ACS, infectious etiology among others.  Patient's labs are resulted showing a reassuring CBC is a normal white blood cell count patient's chemistry does not appear overly concerning given her dialysis status potassium is 4.8.  Patient's troponin is significantly elevated at 154 respiratory panel is negative.  CT scan of the head negative, CT cervical spine negative for trauma.  Chest x-ray shows vascular congestion.   After receiving 500 cc of fluid blood pressure did briefly elevate into the 80s however it has now dropped again into the 60s we will start the patient on norepinephrine .  After receiving 500 cc of fluids patient is now satting in the upper 80s placed on 2 L nasal cannula.  I spoke with Dr. Marcelino of nephrology who will be closely monitoring the patient to see if she needs emergent dialysis overnight otherwise we will plan to dialyze tomorrow.  I spoke with ICU will be down to evaluate the patient for admission.   CRITICAL CARE Performed by: Franky Moores   Total critical care time: 45 minutes  Critical care time was exclusive of separately billable procedures and treating other patients.  Critical care was necessary to treat or prevent imminent or life-threatening deterioration.  Critical care was time spent personally by me on the following activities: development of treatment plan with patient and/or surrogate as well as nursing, discussions with consultants, evaluation of patient's response to treatment, examination of patient, obtaining history from patient or surrogate, ordering and performing treatments and interventions, ordering and review of laboratory studies, ordering and review of radiographic studies, pulse oximetry and re-evaluation of patient's condition.   FINAL CLINICAL IMPRESSION(S) / ED DIAGNOSES   Weakness Hypotension Fluid overload NSTEMI  Note:  This document was prepared using Dragon voice recognition software and may include unintentional dictation errors.   Moores Franky, MD 06/22/24 2249  "

## 2024-06-23 ENCOUNTER — Inpatient Hospital Stay: Admit: 2024-06-23 | Discharge: 2024-06-23 | Disposition: A

## 2024-06-23 ENCOUNTER — Other Ambulatory Visit: Payer: Self-pay

## 2024-06-23 ENCOUNTER — Encounter: Payer: Self-pay | Admitting: Internal Medicine

## 2024-06-23 DIAGNOSIS — R112 Nausea with vomiting, unspecified: Secondary | ICD-10-CM

## 2024-06-23 DIAGNOSIS — R55 Syncope and collapse: Secondary | ICD-10-CM

## 2024-06-23 DIAGNOSIS — I5023 Acute on chronic systolic (congestive) heart failure: Secondary | ICD-10-CM

## 2024-06-23 DIAGNOSIS — Z992 Dependence on renal dialysis: Secondary | ICD-10-CM

## 2024-06-23 DIAGNOSIS — Z7901 Long term (current) use of anticoagulants: Secondary | ICD-10-CM

## 2024-06-23 DIAGNOSIS — Z79899 Other long term (current) drug therapy: Secondary | ICD-10-CM

## 2024-06-23 DIAGNOSIS — Z95828 Presence of other vascular implants and grafts: Secondary | ICD-10-CM

## 2024-06-23 DIAGNOSIS — Z9889 Other specified postprocedural states: Secondary | ICD-10-CM

## 2024-06-23 DIAGNOSIS — I472 Ventricular tachycardia, unspecified: Secondary | ICD-10-CM

## 2024-06-23 DIAGNOSIS — M79672 Pain in left foot: Secondary | ICD-10-CM

## 2024-06-23 DIAGNOSIS — J9621 Acute and chronic respiratory failure with hypoxia: Secondary | ICD-10-CM

## 2024-06-23 DIAGNOSIS — R7989 Other specified abnormal findings of blood chemistry: Secondary | ICD-10-CM

## 2024-06-23 DIAGNOSIS — N186 End stage renal disease: Secondary | ICD-10-CM

## 2024-06-23 DIAGNOSIS — I4891 Unspecified atrial fibrillation: Secondary | ICD-10-CM

## 2024-06-23 DIAGNOSIS — K219 Gastro-esophageal reflux disease without esophagitis: Secondary | ICD-10-CM

## 2024-06-23 DIAGNOSIS — N179 Acute kidney failure, unspecified: Secondary | ICD-10-CM

## 2024-06-23 DIAGNOSIS — J449 Chronic obstructive pulmonary disease, unspecified: Secondary | ICD-10-CM

## 2024-06-23 LAB — CBC
HCT: 38.8 % (ref 36.0–46.0)
Hemoglobin: 12.5 g/dL (ref 12.0–15.0)
MCH: 32.5 pg (ref 26.0–34.0)
MCHC: 32.2 g/dL (ref 30.0–36.0)
MCV: 100.8 fL — ABNORMAL HIGH (ref 80.0–100.0)
Platelets: 125 K/uL — ABNORMAL LOW (ref 150–400)
RBC: 3.85 MIL/uL — ABNORMAL LOW (ref 3.87–5.11)
RDW: 18.6 % — ABNORMAL HIGH (ref 11.5–15.5)
WBC: 6.2 K/uL (ref 4.0–10.5)
nRBC: 0.3 % — ABNORMAL HIGH (ref 0.0–0.2)

## 2024-06-23 LAB — TROPONIN T, HIGH SENSITIVITY
Troponin T High Sensitivity: 155 ng/L (ref 0–19)
Troponin T High Sensitivity: 164 ng/L (ref 0–19)
Troponin T High Sensitivity: 177 ng/L (ref 0–19)
Troponin T High Sensitivity: 165 ng/L (ref 0–19)
Troponin T High Sensitivity: 178 ng/L (ref 0–19)

## 2024-06-23 LAB — GLUCOSE, CAPILLARY
Glucose-Capillary: 88 mg/dL (ref 70–99)
Glucose-Capillary: 97 mg/dL (ref 70–99)
Glucose-Capillary: 76 mg/dL (ref 70–99)
Glucose-Capillary: 97 mg/dL (ref 70–99)
Glucose-Capillary: 101 mg/dL — ABNORMAL HIGH (ref 70–99)
Glucose-Capillary: 164 mg/dL — ABNORMAL HIGH (ref 70–99)
Glucose-Capillary: 155 mg/dL — ABNORMAL HIGH (ref 70–99)

## 2024-06-23 LAB — ECHOCARDIOGRAM COMPLETE
AR max vel: 1.81 cm2
AV Area VTI: 2.08 cm2
AV Area mean vel: 1.64 cm2
AV Mean grad: 3 mmHg
AV Peak grad: 4.4 mmHg
Ao pk vel: 1.05 m/s
Calc EF: 19.4 %
Height: 66 in
S' Lateral: 4.64 cm
Single Plane A2C EF: 2.3 %
Single Plane A4C EF: 31.2 %
Weight: 2631.41 [oz_av]

## 2024-06-23 LAB — MAGNESIUM
Magnesium: 2 mg/dL (ref 1.7–2.4)
Magnesium: 2 mg/dL (ref 1.7–2.4)

## 2024-06-23 LAB — HIV ANTIBODY (ROUTINE TESTING W REFLEX): HIV Screen 4th Generation wRfx: NONREACTIVE

## 2024-06-23 LAB — LACTIC ACID, PLASMA
Lactic Acid, Venous: 3 mmol/L (ref 0.5–1.9)
Lactic Acid, Venous: 2 mmol/L (ref 0.5–1.9)
Lactic Acid, Venous: 1.5 mmol/L (ref 0.5–1.9)
Lactic Acid, Venous: 1.9 mmol/L (ref 0.5–1.9)

## 2024-06-23 LAB — PHOSPHORUS: Phosphorus: 6.4 mg/dL — ABNORMAL HIGH (ref 2.5–4.6)

## 2024-06-23 LAB — MRSA NEXT GEN BY PCR, NASAL: MRSA by PCR Next Gen: DETECTED — AB

## 2024-06-23 LAB — HEPATITIS B SURFACE ANTIGEN: Hepatitis B Surface Ag: NONREACTIVE

## 2024-06-23 LAB — PRO BRAIN NATRIURETIC PEPTIDE: Pro Brain Natriuretic Peptide: >35000 pg/mL — ABNORMAL HIGH

## 2024-06-23 LAB — POTASSIUM: Potassium: 3.8 mmol/L (ref 3.5–5.1)

## 2024-06-23 MED ORDER — HEPARIN SODIUM (PORCINE) 1000 UNIT/ML IJ SOLN
INTRAMUSCULAR | Status: AC
  Filled 2024-06-23: qty 6

## 2024-06-23 MED ORDER — HYDROMORPHONE HCL 1 MG/ML IJ SOLN
0.5000 mg | INTRAMUSCULAR | Status: DC | PRN
  Administered 2024-06-23 – 2024-06-26 (×10): 1 mg via INTRAVENOUS
  Filled 2024-06-23 (×10): qty 1

## 2024-06-23 MED ORDER — AMIODARONE IV BOLUS ONLY 150 MG/100ML: 150.0000 mg | Freq: Once | INTRAVENOUS | Status: AC

## 2024-06-23 MED ORDER — RENA-VITE PO TABS
1.0000 | ORAL_TABLET | Freq: Every day | ORAL | Status: DC
  Administered 2024-06-23 – 2024-06-27 (×5): 1 via ORAL
  Filled 2024-06-23 (×5): qty 1

## 2024-06-23 MED ORDER — APIXABAN 5 MG PO TABS
5.0000 mg | ORAL_TABLET | Freq: Two times a day (BID) | ORAL | Status: DC
  Administered 2024-06-23 – 2024-06-28 (×12): 5 mg via ORAL
  Filled 2024-06-23 (×12): qty 1

## 2024-06-23 MED ORDER — CHLORHEXIDINE GLUCONATE CLOTH 2 % EX PADS
6.0000 | MEDICATED_PAD | Freq: Every day | CUTANEOUS | Status: DC
  Administered 2024-06-23 – 2024-06-25 (×3): 6 via TOPICAL

## 2024-06-23 MED ORDER — AMIODARONE IV BOLUS ONLY 150 MG/100ML
INTRAVENOUS | Status: AC
  Administered 2024-06-23: 150 mg via INTRAVENOUS
  Filled 2024-06-23: qty 100

## 2024-06-23 MED ORDER — AMIODARONE HCL IN DEXTROSE 360-4.14 MG/200ML-% IV SOLN
30.0000 mg/h | INTRAVENOUS | Status: DC
  Administered 2024-06-23: 30 mg/h via INTRAVENOUS
  Filled 2024-06-23: qty 200

## 2024-06-23 MED ORDER — MIDODRINE HCL 5 MG PO TABS: 10.0000 mg | ORAL_TABLET | Freq: Three times a day (TID) | ORAL | Status: DC

## 2024-06-23 MED ORDER — AMIODARONE HCL IN DEXTROSE 360-4.14 MG/200ML-% IV SOLN
60.0000 mg/h | INTRAVENOUS | Status: AC
  Administered 2024-06-23 (×2): 60 mg/h via INTRAVENOUS
  Filled 2024-06-23 (×2): qty 200

## 2024-06-23 MED ORDER — LEVOTHYROXINE SODIUM 50 MCG PO TABS
75.0000 ug | ORAL_TABLET | Freq: Every day | ORAL | Status: DC
  Administered 2024-06-23 – 2024-06-28 (×6): 75 ug via ORAL
  Filled 2024-06-23 (×3): qty 2
  Filled 2024-06-23: qty 1
  Filled 2024-06-23: qty 2
  Filled 2024-06-23: qty 1

## 2024-06-23 MED ORDER — MIDODRINE HCL 5 MG PO TABS
10.0000 mg | ORAL_TABLET | Freq: Three times a day (TID) | ORAL | Status: DC
  Administered 2024-06-23 – 2024-06-28 (×18): 10 mg via ORAL
  Filled 2024-06-23 (×18): qty 2

## 2024-06-23 MED ORDER — PENTAFLUOROPROP-TETRAFLUOROETH EX AERO: 1.0000 | INHALATION_SPRAY | CUTANEOUS | Status: DC | PRN

## 2024-06-23 MED ORDER — NEPRO/CARBSTEADY PO LIQD
237.0000 mL | Freq: Three times a day (TID) | ORAL | Status: DC
  Administered 2024-06-24: 237 mL via ORAL

## 2024-06-23 MED ORDER — HEPARIN SODIUM (PORCINE) 1000 UNIT/ML DIALYSIS: 1000.0000 [IU] | INTRAMUSCULAR | Status: DC | PRN

## 2024-06-23 MED ORDER — IPRATROPIUM-ALBUTEROL 0.5-2.5 (3) MG/3ML IN SOLN: 3.0000 mL | RESPIRATORY_TRACT | Status: DC | PRN

## 2024-06-23 MED ORDER — ROSUVASTATIN CALCIUM 20 MG PO TABS
20.0000 mg | ORAL_TABLET | Freq: Every day | ORAL | Status: DC
  Administered 2024-06-23 – 2024-06-27 (×6): 20 mg via ORAL
  Filled 2024-06-23: qty 1
  Filled 2024-06-23 (×3): qty 2
  Filled 2024-06-23: qty 1
  Filled 2024-06-23: qty 2

## 2024-06-23 MED ORDER — SODIUM CHLORIDE 0.9 % IV BOLUS
500.0000 mL | Freq: Once | INTRAVENOUS | Status: AC
  Administered 2024-06-23: 500 mL via INTRAVENOUS

## 2024-06-23 MED ORDER — AMIODARONE IV BOLUS ONLY 150 MG/100ML: 150.0000 mg | Freq: Once | INTRAVENOUS | Status: DC

## 2024-06-23 MED ORDER — OXYCODONE-ACETAMINOPHEN 5-325 MG PO TABS: 1.0000 | ORAL_TABLET | Freq: Once | ORAL | Status: DC

## 2024-06-23 MED ORDER — LIDOCAINE-PRILOCAINE 2.5-2.5 % EX CREA: 1.0000 | TOPICAL_CREAM | CUTANEOUS | Status: DC | PRN

## 2024-06-23 NOTE — Progress Notes (Signed)
*  PRELIMINARY RESULTS* Echocardiogram 2D Echocardiogram has been performed.  Floydene Harder 06/23/2024, 8:16 AM

## 2024-06-23 NOTE — Progress Notes (Signed)
" °  Bedside ICU 11  Informed consent signed and in chart.    TX duration: 3.5hrs     Hand-off given to patient's nurse. No acute distress noted    Access used: R HD permcath  dressing changed  Access issues: none   Total UF removed: 1.5L Medication(s) given: none Post HD VS: wnl Post HD weight: 76.3kg     Olivia Hurst LPN [facility]   "

## 2024-06-23 NOTE — Consult Note (Signed)
 " CARDIOLOGY CONSULT NOTE               Patient ID: [name] MRN: [id number] DOB/AGE: 62/10/1960 62 y.o.  Admit date: 06/22/2024 Referring Physician Dr. Isaiah ICU critical care attending Primary Physician Dr. Fredy Bathe primary Primary Cardiologist Dr. CANDIE Bathe Reason for Consultation syncope leg pain congestive heart failure  HPI: 64 year old history of end-stage renal disease on dialysis COPD congestive heart failure bipolar disorder chronic hypotension on midodrine  hyperlipidemia previous CVA who presented with a syncopal episode found to be hypotensive with critical limb ischemia and chronic pain patient complains of significant bilateral lower extremity discomfort has had extensive peripheral vascular disease evaluation and management with vascular.  Patient has extensive history of HFrEF with EF less than 25% had AICD in place but was removed after history of pericarditis as well as possible infection patient history of coronary bypass surgery in the past hypoxic hypotensive in the 70s on pressors.  Cardiology was consulted for A-fib cardiomyopathy heart failure hypotension  Review of systems complete and found to be negative unless listed above     Past Medical History:  Diagnosis Date   Acute lacunar infarction (HCC) 03/03/2014   a.) MRI brain 03/03/2014: two acute punctate lacunar infarcts anteriorly in the BILATERAL centrum semiovale   Adult behavior problems    Anemia of chronic renal failure    Aortic atherosclerosis    Arthritis    Atrial fibrillation and flutter (HCC)    a.) CHA2DS2VASc = 6 (sex, HFrEF, HTN, CVA x 2, prior MI/vascular disease) as of 02/04/2024; b.) rate/rhythm maintained on oral metoprolol  succinate; chronically anticoagulated with apixaban    Bipolar disorder (HCC)    Cardiomegaly    Cerebral microvascular disease    CHF (congestive heart failure) (HCC)    Chicken pox    Cholelithiasis    Chronic respiratory failure with hypoxia (HCC)  12/19/2021   COPD (chronic obstructive pulmonary disease) (HCC)    Coronary artery disease    DDD (degenerative disc disease), cervical    DDD (degenerative disc disease), thoracolumbar    Diverticulosis    Dyspnea    ESRD (end stage renal disease) on dialysis (M,W,F)    GERD (gastroesophageal reflux disease)    Gout    HCV (hepatitis C virus)    Headache    HFrEF (heart failure with reduced ejection fraction) (HCC)    History of delirium 04/12/2014   History of substance abuse (HCC)    a.) tobacco + cocaine   Hyperkalemia 11/13/2014   Hyperlipidemia    Hypertension    Hypotension 02/24/2014   Hypothyroidism    Ischemic cardiomyopathy    Nose colonized with MRSA 02/01/2024   a.) presurgical PCR (+) on 02/01/2024 prior to A/V FISTULAGRAM; b.) presurgical PCR (+) on 03/07/2024 prior to AV FISTULA CREATION   NSTEMI (non-ST elevated myocardial infarction) (HCC) 10/08/2013   NSTEMI (non-ST elevated myocardial infarction) (HCC) 01/23/2002   a.) LHC 01/24/2002 --> multi-vessel CAD with IRA being 90% OM2 --> delayed PCI until 01/25/2002 --> 3.0 x 23 mm BX Velocity stent   Obesity    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    On apixaban  therapy    Peripheral vascular disease    Pneumonia    Postoperative anemia due to acute blood loss 02/27/2014   Postoperative cerebrovascular infarction following cardiac surgery 02/21/2014   a.) developed RIGHT upper/lower extremity weakness following cardiac revascularization --> neuro consult and head CT --> Hypodensity noted in the area of the  LEFT motor cortex consistent with a component of acute ischemia   Pulmonary hypertension (HCC)    RBBB (right bundle branch block)    Renal osteodystrophy    S/P CABG x 3 02/20/2014   a.) LIMA-LAD, SVG-OM1, SVG-PDA   Stroke (HCC) 05/23/2010   a.) brain MRI 05/23/2010: BILATERAL acute infarcts and old prior infarcts; RIGHT frontal lobe extending into the white matter. Small focus of restricted  diffusion in the cortex of the medial LEFT frontoparietal lobe. Periventricular T2 and multiple foci of T2 hyperintensity in the subcortical white matter and increased FLAIR signal in the cortex of the LEFT frontal lobe.   Syncope and collapse 07/25/2020    Past Surgical History:  Procedure Laterality Date   A/V FISTULAGRAM Left 03/30/2019   Procedure: A/V FISTULAGRAM;  Surgeon: Marea Selinda RAMAN, MD;  Location: [facility];  Service: Cardiovascular;  Laterality: Left;   A/V FISTULAGRAM Left 10/06/2019   Procedure: A/V FISTULAGRAM;  Surgeon: Marea Selinda RAMAN, MD;  Location: [facility];  Service: Cardiovascular;  Laterality: Left;   A/V FISTULAGRAM Left 05/19/2021   Procedure: A/V FISTULAGRAM;  Surgeon: Marea Selinda RAMAN, MD;  Location: [facility];  Service: Cardiovascular;  Laterality: Left;   A/V FISTULAGRAM Left 01/29/2022   Procedure: A/V Fistulagram;  Surgeon: Marea Selinda RAMAN, MD;  Location: [facility];  Service: Cardiovascular;  Laterality: Left;   A/V FISTULAGRAM Left 04/30/2022   Procedure: A/V Fistulagram;  Surgeon: Marea Selinda RAMAN, MD;  Location: [facility];  Service: Cardiovascular;  Laterality: Left;   A/V FISTULAGRAM Left 08/20/2022   Procedure: A/V Fistulagram;  Surgeon: Marea Selinda RAMAN, MD;  Location: [facility];  Service: Cardiovascular;  Laterality: Left;   A/V FISTULAGRAM N/A 12/03/2022   Procedure: A/V Fistulagram;  Surgeon: Marea Selinda RAMAN, MD;  Location: [facility];  Service: Cardiovascular;  Laterality: N/A;   A/V FISTULAGRAM Left 02/11/2023   Procedure: A/V Fistulagram;  Surgeon: Marea Selinda RAMAN, MD;  Location: [facility];  Service: Cardiovascular;  Laterality: Left;   A/V FISTULAGRAM Left 02/07/2024   Procedure: A/V Fistulagram;  Surgeon: Marea Selinda RAMAN, MD;  Location: [facility];  Service: Cardiovascular;  Laterality: Left;   COLONOSCOPY     COLONOSCOPY WITH PROPOFOL  N/A 04/22/2021   Procedure: COLONOSCOPY WITH  PROPOFOL ;  Surgeon: Maryruth Ole DASEN, MD;  Location: [facility];  Service: Endoscopy;  Laterality: N/A;   CORONARY ANGIOPLASTY WITH STENT PLACEMENT Left 01/25/2002   Procedure: CORONARY ANGIOPLASTY WITH STENT PLACEMENT; Location: [facility]; Surgeon: Margie Lovelace, MD   CORONARY ARTERY BYPASS GRAFT N/A 02/20/2014   Procedure: CORONARY ARTERY BYPASS GRAFT; Location: [facility]; Surgeon: Reyes Fruits, MD   DIALYSIS FISTULA CREATION     DIALYSIS/PERMA CATHETER INSERTION N/A 04/08/2023   Procedure: DIALYSIS/PERMA CATHETER INSERTION;  Surgeon: Marea Selinda RAMAN, MD;  Location: [facility];  Service: Cardiovascular;  Laterality: N/A;   DIALYSIS/PERMA CATHETER REMOVAL N/A 07/06/2023   Procedure: DIALYSIS/PERMA CATHETER REMOVAL;  Surgeon: [name] Cordella MATSU, MD;  Location: [facility];  Service: Cardiovascular;  Laterality: N/A;   ESOPHAGOGASTRODUODENOSCOPY     ESOPHAGOGASTRODUODENOSCOPY N/A 04/22/2021   Procedure: ESOPHAGOGASTRODUODENOSCOPY (EGD);  Surgeon: Maryruth Ole DASEN, MD;  Location: [facility];  Service: Endoscopy;  Laterality: N/A;   FLEXIBLE SIGMOIDOSCOPY N/A 02/23/2019   Procedure: FLEXIBLE SIGMOIDOSCOPY;  Surgeon: Gaylyn Gladis PENNER, MD;  Location: [facility];  Service: Endoscopy;  Laterality: N/A;   IABP INSERTION  02/20/2014   Procedure:  INTRA-AORTIC BALLOON PUMP INSERTION; Location: [facility]; Surgeon: Reyes Fruits, MD   INSERTION OF ARTERIOVENOUS (AV) ARTEGRAFT ARM Right 03/09/2024   Procedure: INSERTION, GRAFT, ARTERIOVENOUS, UPPER EXTREMITY;  Surgeon: Marea Selinda RAMAN, MD;  Location: [facility];  Service: Vascular;  Laterality: Right;  BRACHIAL AXILLARY   INSERTION OF DIALYSIS CATHETER N/A 04/20/2024   Procedure: INSERTION OF DIALYSIS CATHETER;  Surgeon: Marea Selinda RAMAN, MD;  Location: [facility];  Service: Vascular;  Laterality: N/A;   LEFT HEART CATH AND CORONARY ANGIOGRAPHY Left 01/24/2002   Procedure: LEFT HEART CATH AND CORONARY ANGIOGRAPHY; Location: [facility]; Surgeon: Margie Lovelace, MD   LEFT HEART CATH AND CORONARY ANGIOGRAPHY Left 05/25/2012   Procedure: LEFT HEART CATH AND CORONARY ANGIOGRAPHY; Location: [facility]; Surgeon: Margie Lovelace, MD   LEFT HEART CATH AND CORONARY ANGIOGRAPHY Left 10/09/2013   Procedure: LEFT HEART CATH AND CORONARY ANGIOGRAPHY; Location: [facility]; Surgeon: Vinie Jude, MD   LEFT HEART CATH AND CORS/GRAFTS ANGIOGRAPHY N/A 08/08/2018   Procedure: LEFT HEART CATH AND CORS/GRAFTS ANGIOGRAPHY;  Surgeon: Fernand Denyse LABOR, MD;  Location: [facility];  Service: Cardiovascular;  Laterality: N/A;   LEFT HEART CATH AND CORS/GRAFTS ANGIOGRAPHY N/A 01/30/2019   Procedure: LEFT HEART CATH AND CORS/GRAFTS ANGIOGRAPHY;  Surgeon: Fernand Denyse LABOR, MD;  Location: [facility];  Service: Cardiovascular;  Laterality: N/A;   LIGATION ARTERIOVENOUS GORTEX GRAFT Right 04/20/2024   Procedure: LIGATION ARTERIOVENOUS GORTEX GRAFT;  Surgeon: Marea Selinda RAMAN, MD;  Location: [facility];  Service: Vascular;  Laterality: Right;   LOWER EXTREMITY ANGIOGRAPHY Left 04/24/2024   Procedure: Lower Extremity Angiography;  Surgeon: Marea Selinda RAMAN, MD;  Location: [facility];  Service: Cardiovascular;  Laterality: Left;   ultrasound guided pericardiocentesis     UPPER EXTREMITY ANGIOGRAPHY Right 03/30/2024   Procedure: Upper Extremity Angiography;  Surgeon: Marea Selinda RAMAN, MD;  Location: [facility];  Service: Cardiovascular;  Laterality: Right;    Medications Prior to Admission  Medication Sig Dispense Refill Last Dose/Taking   albuterol  (VENTOLIN  HFA) 108 (90 Base) MCG/ACT inhaler Inhale 1-2 puffs into the lungs every 6 (six) hours as needed for wheezing or shortness of breath. 20.1 g 3 Taking As Needed   apixaban  (ELIQUIS ) 5 MG TABS tablet Take 5 mg by mouth 2 (two) times daily.   06/22/2024 Morning   clobetasol cream (TEMOVATE) 0.05 % Apply 1 Application topically daily.   Taking   hydrOXYzine  (ATARAX /VISTARIL ) 50 MG tablet Take 50 mg by mouth 2 (two)  times daily.   06/22/2024   levothyroxine  (SYNTHROID ) 75 MCG tablet Take 75 mcg by mouth daily before breakfast.    06/22/2024   lidocaine -prilocaine  (EMLA ) cream Apply 1 Application topically once.   Taking   metoprolol  succinate (TOPROL -XL) 25 MG 24 hr tablet Take 1 tablet by mouth every morning.   06/22/2024   midodrine  (PROAMATINE ) 10 MG tablet Take 1 tablet (10 mg total) by mouth 3 (three) times daily. (Patient taking differently: Take 10 mg by mouth 2 (two) times daily.) 90 tablet 0 06/22/2024   oxyCODONE -acetaminophen  (PERCOCET) 5-325 MG tablet Take 1 tablet by mouth 2 (two) times daily as needed for severe pain (pain score 7-10). 20 tablet 0 Taking As Needed   cephALEXin  (KEFLEX ) 500 MG capsule Take 1 capsule (500 mg total) by mouth 2 (two) times daily. (Patient not taking: Reported on 06/23/2024) 14 capsule 0 Not Taking   colchicine  0.6 MG tablet Take 1 tablet (0.6 mg total) by mouth once for 1 dose. (Patient not taking:  Reported on 05/23/2024) 1 tablet 0    doxycycline  (VIBRA -TABS) 100 MG tablet Take 1 tablet (100 mg total) by mouth 2 (two) times daily. (Patient not taking: Reported on 05/23/2024) 20 tablet 0 Not Taking   pantoprazole  (PROTONIX ) 40 MG tablet Take 40 mg by mouth every morning. (Patient not taking: Reported on 06/23/2024)   Not Taking   pregabalin (LYRICA) 50 MG capsule Take 50 mg by mouth daily. (Patient not taking: Reported on 06/23/2024)   Not Taking   ranolazine  (RANEXA ) 1000 MG SR tablet Take 1,000 mg by mouth 2 (two) times daily. (Patient not taking: Reported on 06/23/2024)   Not Taking   rosuvastatin  (CRESTOR ) 20 MG tablet Take 20 mg by mouth at bedtime. (Patient not taking: Reported on 06/23/2024)   Not Taking   sevelamer  carbonate (RENVELA ) 800 MG tablet Take 3,200 mg by mouth 3 (three) times daily with meals. (Patient not taking: Reported on 06/23/2024)   Not Taking   triamcinolone  cream (KENALOG ) 0.1 % Apply 1 Application topically 2 (two) times daily. (Patient not taking: Reported on  06/23/2024) 453.6 g 2 Not Taking   venlafaxine (EFFEXOR) 37.5 MG tablet Take 37.5 mg by mouth 2 (two) times daily with a meal. (Patient not taking: Reported on 06/23/2024)   Not Taking   Social History   Socioeconomic History   Marital status: Single    Spouse name: Not on file   Number of children: Not on file   Years of education: Not on file   Highest education level: Not on file  Occupational History   Not on file  Tobacco Use   Smoking status: Every Day    Current packs/day: 0.25    Average packs/day: 0.3 packs/day for 20.0 years (5.0 ttl pk-yrs)    Types: Cigarettes   Smokeless tobacco: Never   Tobacco comments:    smokes 1 - 1.5 cigarettes a day  Vaping Use   Vaping status: Never Used  Substance and Sexual Activity   Alcohol use: No    Comment: social   Drug use: No   Sexual activity: Not Currently    Birth control/protection: Post-menopausal  Other Topics Concern   Not on file  Social History Narrative   Lives alone   Social Drivers of Health   Tobacco Use: High Risk (06/23/2024)   Patient History    Smoking Tobacco Use: Every Day    Smokeless Tobacco Use: Never    Passive Exposure: Not on file  Financial Resource Strain: Low Risk  (05/31/2024)   Received from [facility]   Overall Financial Resource Strain (CARDIA)    Difficulty of Paying Living Expenses: Not hard at all  Food Insecurity: No Food Insecurity (06/23/2024)   Epic    Worried About Radiation Protection Practitioner of Food in the Last Year: Never true    Ran Out of Food in the Last Year: Never true  Transportation Needs: No Transportation Needs (06/23/2024)   Epic    Lack of Transportation (Medical): No    Lack of Transportation (Non-Medical): No  Physical Activity: Not on file  Stress: Not on file  Social Connections: Not on file  Intimate Partner Violence: Not At Risk (06/23/2024)   Epic    Fear of Current or Ex-Partner: No    Emotionally Abused: No    Physically Abused: No    Sexually Abused: No   Depression (PHQ2-9): Low Risk (07/06/2023)   Depression (PHQ2-9)    PHQ-2 Score: 3  Alcohol Screen: Not on file  Housing: Low Risk (06/23/2024)   Epic    Unable to Pay for Housing in the Last Year: No    Number of Times Moved in the Last Year: 0    Homeless in the Last Year: No  Utilities: Not At Risk (06/23/2024)   Epic    Threatened with loss of utilities: No  Health Literacy: Not on file    Family History  Problem Relation Age of Onset   Hypertension Mother    Ovarian cancer Mother    Diabetes type II Mother    Hypertension Father    Kidney disease Father    Hypertension Sister    Diabetes type II Maternal Grandmother    Breast cancer Maternal Grandmother    Breast cancer Maternal Aunt    Hypertension Other    Cancer Other    Renal Disease Other       Review of systems complete and found to be negative unless listed above      PHYSICAL EXAM  General: Well developed, well nourished, in no acute distress HEENT:  Normocephalic and atramatic Neck:  No JVD.  Lungs: Clear bilaterally to auscultation and percussion. Heart: HRRR . Normal S1 and S2 without gallops or murmurs.  Abdomen: Bowel sounds are positive, abdomen soft and non-tender  Msk:  Back normal, normal gait. Normal strength and tone for age. Extremities: No clubbing, cyanosis or edema.  Reduced pulses discoloration pain tenderness Neuro: Alert and oriented X 3. Psych:  Good affect, responds appropriately  Labs:   Lab Results  Component Value Date   WBC 6.2 06/23/2024   HGB 12.5 06/23/2024   HCT 38.8 06/23/2024   MCV 100.8 (H) 06/23/2024   PLT 125 (L) 06/23/2024    Recent Labs  Lab 06/22/24 2128 06/23/24 0902  NA 137  --   K 4.8 3.8  CL 96*  --   CO2 19*  --   BUN 35*  --   CREATININE 6.26*  --   CALCIUM  9.8  --   PROT 7.4  --   BILITOT 0.7  --   ALKPHOS 85  --   ALT 27  --   AST 49*  --   GLUCOSE 90  --    Lab Results  Component Value Date   CKTOTAL 42 06/23/2014   CKMB 1.2  06/23/2014   TROPONINI 0.07 (HH) 08/25/2018    Lab Results  Component Value Date   CHOL 140 01/18/2019   CHOL 79 06/14/2014   CHOL 127 02/10/2014   Lab Results  Component Value Date   HDL 43 01/18/2019   HDL 35 (L) 06/14/2014   HDL 53 02/10/2014   Lab Results  Component Value Date   LDLCALC 67 01/18/2019   LDLCALC 27 06/14/2014   LDLCALC 53 02/10/2014   Lab Results  Component Value Date   TRIG 148 01/18/2019   TRIG 85 06/14/2014   TRIG 105 02/10/2014   Lab Results  Component Value Date   CHOLHDL 3.3 01/18/2019   No results found for: LDLDIRECT    Radiology: ECHOCARDIOGRAM COMPLETE Result Date: 06/23/2024    ECHOCARDIOGRAM REPORT   Patient Name:   [name] Date of Exam: 06/23/2024 Medical Rec #:  [id number]         Height:       66.0 in Accession #:    [id number]        Weight:       164.5 lb Date of Birth:  [birth year]  BSA:          1.840 m Patient Age:    63 years          BP:           101/73 mmHg Patient Gender: F                 HR:           107 bpm. Exam Location:  [facility] Procedure: 2D Echo, Color Doppler and Cardiac Doppler (Both Spectral and Color            Flow Doppler were utilized during procedure). STAT ECHO Indications:     Syncope R55  History:         Patient has prior history of Echocardiogram examinations, most                  recent 05/17/2023. COPD; Arrythmias:Atrial Fibrillation and                  Atrial Flutter.  Sonographer:     Christopher Furnace Referring Phys:  [id number] ROBET KIM Diagnosing Phys: Chevy Virgo D Lyanne Kates MD IMPRESSIONS  1. Left ventricular ejection fraction, by estimation, is 25 to 30%. The left ventricle has severely decreased function. The left ventricle demonstrates global hypokinesis. The left ventricular internal cavity size was severely dilated. Left ventricular diastolic function could not be evaluated.  2. Right ventricular systolic function is severely reduced. The right ventricular size is severely enlarged. Mildly increased  right ventricular wall thickness.  3. Left atrial size was mildly dilated.  4. Right atrial size was severely dilated.  5. The mitral valve is normal in structure. Mild mitral valve regurgitation.  6. Tricuspid valve regurgitation is severe.  7. The aortic valve is grossly normal. Aortic valve regurgitation is not visualized. Aortic valve sclerosis is present, with no evidence of aortic valve stenosis. FINDINGS  Left Ventricle: Left ventricular ejection fraction, by estimation, is 25 to 30%. The left ventricle has severely decreased function. The left ventricle demonstrates global hypokinesis. Strain was performed and the global longitudinal strain is indeterminate. The left ventricular internal cavity size was severely dilated. There is borderline left ventricular hypertrophy. Left ventricular diastolic function could not be evaluated. Right Ventricle: The right ventricular size is severely enlarged. Mildly increased right ventricular wall thickness. Right ventricular systolic function is severely reduced. Left Atrium: Left atrial size was mildly dilated. Right Atrium: Right atrial size was severely dilated. Pericardium: There is no evidence of pericardial effusion. Mitral Valve: The mitral valve is normal in structure. Mild mitral valve regurgitation. Tricuspid Valve: The tricuspid valve is grossly normal. Tricuspid valve regurgitation is severe. Aortic Valve: The aortic valve is grossly normal. Aortic valve regurgitation is not visualized. Aortic valve sclerosis is present, with no evidence of aortic valve stenosis. Aortic valve mean gradient measures 3.0 mmHg. Aortic valve peak gradient measures 4.4 mmHg. Aortic valve area, by VTI measures 2.08 cm. Pulmonic Valve: The pulmonic valve was not well visualized. Pulmonic valve regurgitation is mild. Aorta: The aortic root was not well visualized. IAS/Shunts: No atrial level shunt detected by color flow Doppler. Additional Comments: 3D was performed not requiring  image post processing on an independent workstation and was indeterminate.  LEFT VENTRICLE PLAX 2D LVIDd:         6.02 cm      Diastology LVIDs:         4.64 cm      LV e' medial:  4.64 cm/s LV PW:  1.11 cm      LV e' lateral: 8.32 cm/s LV IVS:        1.31 cm LVOT diam:     2.00 cm LV SV:         28 LV SV Index:   15 LVOT Area:     3.14 cm  LV Volumes (MOD) LV vol d, MOD A2C: 172.0 ml LV vol d, MOD A4C: 173.0 ml LV vol s, MOD A2C: 168.0 ml LV vol s, MOD A4C: 119.0 ml LV SV MOD A2C:     4.0 ml LV SV MOD A4C:     173.0 ml LV SV MOD BP:      33.8 ml RIGHT VENTRICLE RV Basal diam:  4.52 cm RV Mid diam:    3.20 cm LEFT ATRIUM             Index        RIGHT ATRIUM           Index LA diam:        3.40 cm 1.85 cm/m   RA Area:     28.70 cm LA Vol (A2C):   86.5 ml 47.00 ml/m  RA Volume:   110.00 ml 59.77 ml/m LA Vol (A4C):   67.8 ml 36.84 ml/m LA Biplane Vol: 78.1 ml 42.44 ml/m  AORTIC VALVE AV Area (Vmax):    1.81 cm AV Area (Vmean):   1.64 cm AV Area (VTI):     2.08 cm AV Vmax:           105.00 cm/s AV Vmean:          79.900 cm/s AV VTI:            0.133 m AV Peak Grad:      4.4 mmHg AV Mean Grad:      3.0 mmHg LVOT Vmax:         60.60 cm/s LVOT Vmean:        41.700 cm/s LVOT VTI:          0.088 m LVOT/AV VTI ratio: 0.66  AORTA Ao Root diam: 2.30 cm TRICUSPID VALVE TR Peak grad:   45.7 mmHg TR Vmax:        338.00 cm/s  SHUNTS Systemic VTI:  0.09 m Systemic Diam: 2.00 cm [name] MD Electronically signed by [name] MD Signature Date/Time: 06/23/2024/2:44:50 PM    Final    CT Cervical Spine Wo Contrast Result Date: 06/22/2024 EXAM: CT CERVICAL SPINE WITHOUT CONTRAST 06/22/2024 09:55:46 PM TECHNIQUE: CT of the cervical spine was performed without the administration of intravenous contrast. Multiplanar reformatted images are provided for review. Automated exposure control, iterative reconstruction, and/or weight based adjustment of the mA/kV was utilized to reduce the radiation dose to as low  as reasonably achievable. COMPARISON: None available. CLINICAL HISTORY: Polytrauma, blunt. FINDINGS: BONES AND ALIGNMENT: Reversal of the normal mid cervical lordosis, chronic. No acute fracture or traumatic malalignment. DEGENERATIVE CHANGES: Severe erosive endplate changes at C5-C6, chronic but progressive when compared to remote prior. Differential considerations include chronic osteomyelitis versus severe degenerative changes with bony remodeling. SOFT TISSUES: No prevertebral soft tissue swelling. IMPRESSION: 1. No traumatic injury to the cervical spine. 2. Severe erosive endplate changes at C5-C6, chronic but progressive. Differential considerations include chronic osteomyelitis versus severe degenerative changes with bony remodeling. Correlate with laboratory evaluation to exclude infection. Electronically signed by: Pinkie Pebbles MD MD 06/22/2024 10:07 PM EST RP Workstation: [id number]   CT HEAD WO CONTRAST ( ) Result  Date: 06/22/2024 EXAM: CT HEAD WITHOUT CONTRAST 06/22/2024 09:55:46 PM TECHNIQUE: CT of the head was performed without the administration of intravenous contrast. Automated exposure control, iterative reconstruction, and/or weight based adjustment of the mA/kV was utilized to reduce the radiation dose to as low as reasonably achievable. COMPARISON: MRI brain dated 08/16/2023. CLINICAL HISTORY: Polytrauma, blunt. FINDINGS: BRAIN AND VENTRICLES: No acute hemorrhage. No evidence of acute infarct. Remote infarcts in bilateral frontal lobes and right parietal lobe. Moderate white matter disease. Intracranial atherosclerosis. No hydrocephalus. No extra-axial collection. No mass effect or midline shift. ORBITS: No acute abnormality. SINUSES: No acute abnormality. SOFT TISSUES AND SKULL: No acute soft tissue abnormality. No skull fracture. IMPRESSION: 1. No acute intracranial abnormality. 2. 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 Electronically signed by: Pinkie Pebbles MD MD 06/22/2024 10:03 PM  EST RP Workstation: [id number]   DG Chest Portable 1 View Result Date: 06/22/2024 EXAM: 1 VIEW XRAY OF THE CHEST 06/22/2024 09:30:41 PM COMPARISON: 02/28/2024. Prior CAG. CLINICAL HISTORY: weakness FINDINGS: LINES, TUBES AND DEVICES: Right dialysis catheter tip in the right atrium. LUNGS AND PLEURA: Vascular congestion. No overt edema. No focal pulmonary opacity. No pleural effusion. No pneumothorax. HEART AND MEDIASTINUM: Cardiomegaly. BONES AND SOFT TISSUES: No acute osseous abnormality. IMPRESSION: 1. Cardiomegaly with vascular congestion without overt edema. Electronically signed by: Franky Crease MD 06/22/2024 09:35 PM EST RP Workstation: [id number]    EKG: Atrial fibrillation ventricular bigeminy nonspecific interventricular conduction delay polarization abnormality rate of 80  ASSESSMENT AND PLAN:  Critical limb ischemia Acute on chronic hypoxic respiratory failure COPD Circulatory shock Acute on chronic HFrEF Syncope Atrial fibrillation on Eliquis  Borderline troponin Hypoxemia End-stage renal disease on dialysis . Plan Agree with vascular surgery consult evaluation for severe peripheral vascular disease and critical limb ischemia Inhalers as necessary COPD Acute on chronic HFrEF continue current therapy Hypotension continue current therapy pressors as necessary Chronic thrombocytopenia diffuse anemia Chronic pain syndrome recommend follow-up with pain clinic Agree with anticoagulation with Eliquis  5 fibrillation .  SABRA   Signed: [name] MD, 06/23/2024, 8:54 PM      "

## 2024-06-23 NOTE — Plan of Care (Signed)

## 2024-06-23 NOTE — Progress Notes (Signed)
 eLink Physician-Brief Progress Note Patient Name: ANGILA WOMBLES DOB: [birth year] MRN: [id number]   Date of Service  06/23/2024  HPI/Events of Note  Patient with ESRD-DD admitted with hypotension and a syncopal episode, work-up in progress.  eICU Interventions  New Patient Evaluation.        Anja Neuzil U Yoltzin Ransom 06/23/2024, 12:12 AM

## 2024-06-23 NOTE — Plan of Care (Signed)

## 2024-06-23 NOTE — H&P (Signed)
 "  NAME:  [name], MRN:  [id number], DOB:  Jul 21, 1960, LOS: 1 ADMISSION DATE:  06/22/2024, CONSULTATION DATE:  06/22/24 REFERRING MD:  Dr. Dorothyann, CHIEF COMPLAINT:  syncope   History of Present Illness:  [name] is a 64 y.o. female with a pmh of of ESRD on HD (MWF), COPD, CHF, bipolar disorder, chronic hypotension on midodrine , HLD and prior CVA who presented to [facility] ED via EMS following syncopal episode.   History was gathered per chart review Per patient, her normal dialysis schedule is MWF. At her most recent session on Wednesday, she was told that her blood pressure was too low to get dialysis.  Patient states prior to arrival she may have lost consciousness, fell to the ground but was unsure if she hit her head. She reported she has been very nauseated with multiple episodes of vomiting prior to arrival and has felt generalized weakness. Per chart review, it is noted she had a history of syncope and collapse in 2023.  ED course: Upon arrival to [facility] ED, the patient was awake and alert, mentating well but hypotensive at 71/44. She was given 1L NS bolus with minimal effect on her blood pressure, therefore levophed  was started. Following fluid resuscitation, she became mildly hypoxic and was started on 3L [place].  Vitals on Arrival: temp 97.9, BP 69/38, HR 28, RR 13, SpO2 98 on 3L [place]  Pertinent Labs/Diagnostics: Revealed AGMA with lactic acidosis, mildly elevated troponin s/t suspected demand ischemia, acute renal failure with chronic thrombocytopenia  I, Robet Kim, PA-C personally viewed and interpreted this ECG. EKG Interpretation: Date: 06/22/24, EKG Time: 2112, Rate: 81, Rhythm: sinus rhythm, QRS Axis: normal axis, mildly widened QRS ST/T Wave abnormalities: non specific ST changes with ectopy, Narrative Interpretation: ventricular bigeminy with RBBB  Chemistry: Na+:137, K+: 4.8, BUN/Cr.: 35/6.26, Serum CO2/ AG: 19/22 AST: 49  Hematology: WBC: 5.8, Hgb: 13.6, Platelets  116 Troponin: 154 >> 155, Lactic/ PCT: 3.0, COVID-19 & Influenza A/B: negative  Resp Panel PCR: negative CXR : cardiomegaly with vascular congestion without edema CT Head: no acute intracranial abnormality; old bilateral infarcts CT C Spine: no traumatic injury to the c spine; severe endplate changes at c5-c6, chronic but progressive - chronic osteomyelitis vs severe degenerative changes  Medications Administered:  1L NS x 1 Levophed  gtt   PCCM consulted for admission due to hypotension requiring levophed  gtt.   Pertinent Medical History  Hypotension & Hypertension HLD HFrEF ESRD on HD Stroke CAD s/p CABG x 3 RBBB Pulmonary hypertension PVD Ischemic cardiomyopathy Hyperkalemia Hypothyroidism Atrial fibrillation on eliquis  Anemia of chronic kidney disease   Significant Hospital Events: Including procedures, antibiotic start and stop dates in addition to other pertinent events   06/22/24: Admitted for circulatory shock requiring levophed  gtt. Transferred to the ICU. On room air.   Interim History / Subjective:  Levophed  requirements decreasing. Reporting left foot pain which is chronic  Objective    Blood pressure (!) 88/52, pulse (!) 113, temperature (!) 97.4 F (36.3 C), temperature source Axillary, resp. rate 13, height 5' 6 (1.676 m), weight 74.6 kg, SpO2 92%.        Intake/Output Summary (Last 24 hours) at 06/23/2024 0256 Last data filed at 06/23/2024 0000 Gross per 24 hour  Intake 523.91 ml  Output --  Net 523.91 ml   Filed Weights   06/22/24 2106 06/22/24 2330  Weight: 72.8 kg 74.6 kg    Examination: General: Adult female, acutely ill, lying in bed in mild distress d/t  heel pain HENT: atraumatic, normocephalic, neck supple  Cardiovascular: ventricular rhythm, S1 S2, no r/m/g Pulm: Regular, non labored on [place] , breath sounds equal throughout GI: soft, non-tender, non-distended, no rebound/guarding, bs x 4 Extremities: warm/dry, pulses + 2 R/P, no edema  noted Neuro: A&O x 4, follows commands, no focal neuro deficits, PERRL  GU: deferred  Resolved problem list   Assessment and Plan   #Acute on Chronic Hypoxic Respiratory Failure ~ Improving #COPD - Supplemental O2 to maintain SpO2 >92% - Consider NPPV with BIPAP thereapy as needed; currently on room air - No longer requiring 3LNC - Duonebs prn  #Circulatory Shock - cardiogenic vs hypovolemic #HFrEF #Syncope and Collapse (Hx of the same in 2023) #Chronic Hypotension on midodrine  #Acute on Chronic HFrEF exacerbation #Atrial Fibrillation on Eliquis  #Mildly Elevated Troponin s/t Demand Ischemia Hx: Hypotension, HLD, CAD s/p CABG, Ischemic Cardiomyopathy, CVA 06/22/24 CT Head and C Spine: no acute intracranial abnormality or traumatic injury. - Continuous cardiac monitoring - Vasopressors to maintain MAP goal >65 ~ currently on levophed  gtt - Wean pressors as able - EKG prn - Midodrine  10mg  TID - ECHO ordered - BNP pending - Given she is mentating appropriately, will hold off on neuro imaging for now  #AGMA s/t Lactic acidosis #ESRD on HD (MWF) #Acute on Chronic Renal Failure Cr on admission 6.26 (5.87 in November 2025) 06/22/24 CXR: cardiomegaly with vascular congestion without overt edema Initial interventions/workup included: 1 L of NS  - Strict I/Os - Trend BMP, Mg and Phosphorus - Trend lactic: 3.0 >> 2.0 - NS bolus x 1 - Avoid nephrotoxins as able - Ensure adequate renal perfusion - ICU electrolyte replacement protocol - Pharmacy to assist with replacement as indicate - Nephrology informed via EDP; plan for HD on 1/9  #GERD #Nausea and Vomiting Hx: GERD, HCV, Diverticulosis - Monitor hepatic function as indication - Antiemetics prn - Constipation protocol prn - Diet: NPO except with meds  #Chronic Thrombocytopenia #VTE prophylaxis - Trend CBC  - Transfuse if Hgb < 7 or platelets <10 - Monitor for s/sx of bleeding - Continue eliquis  5mg  BID  #Chronic Pain  s/t Vascular Ulcers - percocet 5-325 mg x 1 - supportive care   Labs   CBC: Recent Labs  Lab 06/22/24 2128  WBC 5.8  HGB 13.6  HCT 43.6  MCV 102.6*  PLT 116*    Basic Metabolic Panel: Recent Labs  Lab 06/22/24 2128  NA 137  K 4.8  CL 96*  CO2 19*  GLUCOSE 90  BUN 35*  CREATININE 6.26*  CALCIUM  9.8   GFR: Estimated Creatinine Clearance: 9.7 mL/min (A) (by C-G formula based on SCr of 6.26 mg/dL (H)). Recent Labs  Lab 06/22/24 2128 06/23/24 0015  WBC 5.8  --   LATICACIDVEN  --  3.0*    Liver Function Tests: Recent Labs  Lab 06/22/24 2128  AST 49*  ALT 27  ALKPHOS 85  BILITOT 0.7  PROT 7.4  ALBUMIN  4.0   No results for input(s): LIPASE, AMYLASE in the last 168 hours. No results for input(s): AMMONIA in the last 168 hours.  ABG    Component Value Date/Time   HCO3 29.4 (H) 02/25/2018 0848   TCO2 26 04/20/2024 0708   O2SAT 58.6 02/25/2018 0848     Coagulation Profile: No results for input(s): INR, PROTIME in the last 168 hours.  Cardiac Enzymes: No results for input(s): CKTOTAL, CKMB, CKMBINDEX, TROPONINI in the last 168 hours.  HbA1C: No results found for: HGBA1C  CBG: No results for input(s): GLUCAP in the last 168 hours.  Review of Systems:   Review of Systems  Constitutional:  Positive for malaise/fatigue. Negative for chills and fever.  Respiratory:  Negative for cough and shortness of breath.   Cardiovascular:  Negative for chest pain and palpitations.  Gastrointestinal:  Positive for nausea. Negative for abdominal pain and vomiting.  Musculoskeletal:        Left heel pain s/t vascular ulcers  Neurological:  Positive for weakness. Negative for dizziness and headaches.     Past Medical History:  She,  has a past medical history of Acute lacunar infarction [facility]) (03/03/2014), Adult behavior problems, Anemia of chronic renal failure, Aortic atherosclerosis, Arthritis, Atrial fibrillation and flutter (HCC),  Bipolar disorder (HCC), Cardiomegaly, Cerebral microvascular disease, CHF (congestive heart failure) (HCC), Chicken pox, Cholelithiasis, Chronic respiratory failure with hypoxia (HCC) (12/19/2021), COPD (chronic obstructive pulmonary disease) (HCC), Coronary artery disease, DDD (degenerative disc disease), cervical, DDD (degenerative disc disease), thoracolumbar, Diverticulosis, Dyspnea, ESRD (end stage renal disease) on dialysis (M,W,F), GERD (gastroesophageal reflux disease), Gout, HCV (hepatitis C virus), Headache, HFrEF (heart failure with reduced ejection fraction) (HCC), History of delirium (04/12/2014), History of substance abuse (HCC), Hyperkalemia (11/13/2014), Hyperlipidemia, Hypertension, Hypotension (02/24/2014), Hypothyroidism, Ischemic cardiomyopathy, Nose colonized with MRSA (02/01/2024), NSTEMI (non-ST elevated myocardial infarction) (HCC) (10/08/2013), NSTEMI (non-ST elevated myocardial infarction) (HCC) (01/23/2002), Obesity, 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, On apixaban  therapy, Peripheral vascular disease, Pneumonia, Postoperative anemia due to acute blood loss (02/27/2014), Postoperative cerebrovascular infarction following cardiac surgery (02/21/2014), Pulmonary hypertension (HCC), RBBB (right bundle branch block), Renal osteodystrophy, S/P CABG x 3 (02/20/2014), Stroke (HCC) (05/23/2010), and Syncope and collapse (07/25/2020).   Surgical History:   Past Surgical History:  Procedure Laterality Date   A/V FISTULAGRAM Left 03/30/2019   Procedure: A/V FISTULAGRAM;  Surgeon: Marea Selinda RAMAN, MD;  Location: [facility] IN[facility] CV [facility];  Service: Cardiovascular;  Laterality: Left;   A/V FISTULAGRAM Left 10/06/2019   Procedure: A/V FISTULAGRAM;  Surgeon: Marea Selinda RAMAN, MD;  Location: [facility];  Service: Cardiovascular;  Laterality: Left;   A/V FISTULAGRAM Left 05/19/2021   Procedure: A/V FISTULAGRAM;  Surgeon: Marea Selinda RAMAN, MD;  Location: [facility];  Service:  Cardiovascular;  Laterality: Left;   A/V FISTULAGRAM Left 01/29/2022   Procedure: A/V Fistulagram;  Surgeon: Marea Selinda RAMAN, MD;  Location: [facility];  Service: Cardiovascular;  Laterality: Left;   A/V FISTULAGRAM Left 04/30/2022   Procedure: A/V Fistulagram;  Surgeon: Marea Selinda RAMAN, MD;  Location: [facility];  Service: Cardiovascular;  Laterality: Left;   A/V FISTULAGRAM Left 08/20/2022   Procedure: A/V Fistulagram;  Surgeon: Marea Selinda RAMAN, MD;  Location: [facility];  Service: Cardiovascular;  Laterality: Left;   A/V FISTULAGRAM N/A 12/03/2022   Procedure: A/V Fistulagram;  Surgeon: Marea Selinda RAMAN, MD;  Location: [facility];  Service: Cardiovascular;  Laterality: N/A;   A/V FISTULAGRAM Left 02/11/2023   Procedure: A/V Fistulagram;  Surgeon: Marea Selinda RAMAN, MD;  Location: [facility];  Service: Cardiovascular;  Laterality: Left;   A/V FISTULAGRAM Left 02/07/2024   Procedure: A/V Fistulagram;  Surgeon: Marea Selinda RAMAN, MD;  Location: [facility];  Service: Cardiovascular;  Laterality: Left;   COLONOSCOPY     COLONOSCOPY WITH PROPOFOL  N/A 04/22/2021   Procedure: COLONOSCOPY WITH PROPOFOL ;  Surgeon: Maryruth Ole DASEN, MD;  Location: [facility]OS[facility];  Service: Endoscopy;  Laterality: N/A;   CORONARY ANGIOPLASTY WITH  STENT PLACEMENT Left 01/25/2002   Procedure: CORONARY ANGIOPLASTY WITH STENT PLACEMENT; Location: [facility]; Surgeon: Margie Lovelace, MD   CORONARY ARTERY BYPASS GRAFT N/A 02/20/2014   Procedure: CORONARY ARTERY BYPASS GRAFT; Location: [facility]; Surgeon: Reyes Fruits, MD   DIALYSIS FISTULA CREATION     DIALYSIS/PERMA CATHETER INSERTION N/A 04/08/2023   Procedure: DIALYSIS/PERMA CATHETER INSERTION;  Surgeon: Marea Selinda RAMAN, MD;  Location: [facility];  Service: Cardiovascular;  Laterality: N/A;   DIALYSIS/PERMA CATHETER REMOVAL N/A 07/06/2023   Procedure: DIALYSIS/PERMA CATHETER REMOVAL;  Surgeon: [name] Cordella MATSU, MD;  Location: [facility] CV [facility];  Service: Cardiovascular;  Laterality: N/A;   ESOPHAGOGASTRODUODENOSCOPY     ESOPHAGOGASTRODUODENOSCOPY N/A 04/22/2021   Procedure: ESOPHAGOGASTRODUODENOSCOPY (EGD);  Surgeon: Maryruth Ole DASEN, MD;  Location: [facility] ENDOSCOPY;  Service: Endoscopy;  Laterality: N/A;   FLEXIBLE SIGMOIDOSCOPY N/A 02/23/2019   Procedure: FLEXIBLE SIGMOIDOSCOPY;  Surgeon: Gaylyn Gladis PENNER, MD;  Location: [facility] ENDOSCOPY;  Service: Endoscopy;  Laterality: N/A;   IABP INSERTION  02/20/2014   Procedure: INTRA-AORTIC BALLOON PUMP INSERTION; Location: [facility]; Surgeon: Reyes Fruits, MD   INSERTION OF ARTERIOVENOUS (AV) ARTEGRAFT ARM Right 03/09/2024   Procedure: INSERTION, GRAFT, ARTERIOVENOUS, UPPER EXTREMITY;  Surgeon: Marea Selinda RAMAN, MD;  Location: [facility] OR[facility];  Service: Vascular;  Laterality: Right;  BRACHIAL AXILLARY   INSERTION OF DIALYSIS CATHETER N/A 04/20/2024   Procedure: INSERTION OF DIALYSIS CATHETER;  Surgeon: Marea Selinda RAMAN, MD;  Location: [facility] OR[facility];  Service: Vascular;  Laterality: N/A;   LEFT HEART CATH AND CORONARY ANGIOGRAPHY Left 01/24/2002   Procedure: LEFT HEART CATH AND CORONARY ANGIOGRAPHY; Location: [facility]; Surgeon: Margie Lovelace, MD   LEFT HEART CATH AND CORONARY ANGIOGRAPHY Left 05/25/2012   Procedure: LEFT HEART CATH AND CORONARY ANGIOGRAPHY; Location: [facility]; Surgeon: Margie Lovelace, MD   LEFT HEART CATH AND CORONARY ANGIOGRAPHY Left 10/09/2013   Procedure: LEFT HEART CATH AND CORONARY ANGIOGRAPHY; Location: [facility]; Surgeon: Vinie Jude, MD   LEFT HEART CATH AND CORS/GRAFTS ANGIOGRAPHY N/A 08/08/2018   Procedure: LEFT HEART CATH AND CORS/GRAFTS ANGIOGRAPHY;  Surgeon: Fernand Denyse LABOR, MD;  Location: [facility];  Service: Cardiovascular;  Laterality: N/A;   LEFT HEART CATH AND CORS/GRAFTS ANGIOGRAPHY N/A 01/30/2019   Procedure: LEFT HEART CATH AND CORS/GRAFTS ANGIOGRAPHY;  Surgeon: Fernand Denyse LABOR, MD;  Location: [facility];  Service: Cardiovascular;  Laterality: N/A;    LIGATION ARTERIOVENOUS GORTEX GRAFT Right 04/20/2024   Procedure: LIGATION ARTERIOVENOUS GORTEX GRAFT;  Surgeon: Marea Selinda RAMAN, MD;  Location: [facility];  Service: Vascular;  Laterality: Right;   LOWER EXTREMITY ANGIOGRAPHY Left 04/24/2024   Procedure: Lower Extremity Angiography;  Surgeon: Marea Selinda RAMAN, MD;  Location: [facility];  Service: Cardiovascular;  Laterality: Left;   ultrasound guided pericardiocentesis     UPPER EXTREMITY ANGIOGRAPHY Right 03/30/2024   Procedure: Upper Extremity Angiography;  Surgeon: Marea Selinda RAMAN, MD;  Location: [facility];  Service: Cardiovascular;  Laterality: Right;     Social History:   reports that she has been smoking cigarettes. She has a 5 pack-year smoking history. She has never used smokeless tobacco. She reports that she does not drink alcohol and does not use drugs.   Family History:  Her family history includes Breast cancer in her maternal aunt and maternal grandmother; Cancer in an other family member; Diabetes type II in her maternal grandmother and mother; Hypertension in her father, mother, sister, and another family member; Kidney disease in her father; Ovarian cancer in her mother; Renal Disease  in an other family member.   Allergies Allergies[1]   Home Medications  Prior to Admission medications  Medication Sig Start Date End Date Taking? Authorizing Provider  albuterol  (VENTOLIN  HFA) 108 (90 Base) MCG/ACT inhaler Inhale 1-2 puffs into the lungs every 6 (six) hours as needed for wheezing or shortness of breath. 01/04/24   Orlean Alan HERO, FNP  apixaban  (ELIQUIS ) 5 MG TABS tablet Take 5 mg by mouth 2 (two) times daily. 09/11/19   [provider]  cephALEXin  (KEFLEX ) 500 MG capsule Take 1 capsule (500 mg total) by mouth 2 (two) times daily. 05/23/24   Fernand Fredy RAMAN, MD  colchicine  0.6 MG tablet Take 1 tablet (0.6 mg total) by mouth once for 1 dose. Patient not taking: Reported on 05/23/2024 04/13/24 05/19/24  Scoggins,  [facility], NP  doxycycline  (VIBRA -TABS) 100 MG tablet Take 1 tablet (100 mg total) by mouth 2 (two) times daily. Patient not taking: Reported on 05/23/2024 05/09/24   Janit Thresa HERO, DPM  hydrOXYzine  (ATARAX /VISTARIL ) 50 MG tablet Take 50 mg by mouth at bedtime.    [provider]  levothyroxine  (SYNTHROID ) 75 MCG tablet Take 75 mcg by mouth daily before breakfast.  11/02/18   [provider]  lidocaine -prilocaine  (EMLA ) cream Apply 1 Application topically once. 01/22/21   [provider]  metoprolol  succinate (TOPROL -XL) 25 MG 24 hr tablet Take 1 tablet by mouth every morning. 08/27/23 08/26/24  [provider]  midodrine  (PROAMATINE ) 10 MG tablet Take 1 tablet (10 mg total) by mouth 3 (three) times daily. 02/17/24   Fernand Denyse LABOR, MD  oxyCODONE -acetaminophen  (PERCOCET) 5-325 MG tablet Take 1 tablet by mouth 2 (two) times daily as needed for severe pain (pain score 7-10). 05/23/24   Fernand Fredy RAMAN, MD  pantoprazole  (PROTONIX ) 40 MG tablet Take 40 mg by mouth every morning. 11/29/23   [provider]  ranolazine  (RANEXA ) 1000 MG SR tablet Take 1,000 mg by mouth 2 (two) times daily. 03/18/23   [provider]  rosuvastatin  (CRESTOR ) 20 MG tablet Take 20 mg by mouth at bedtime. 11/07/21   [provider]  sevelamer  carbonate (RENVELA ) 800 MG tablet Take 3,200 mg by mouth 3 (three) times daily with meals.    [provider]  triamcinolone  cream (KENALOG ) 0.1 % Apply 1 Application topically 2 (two) times daily. 11/02/23   Orlean Alan HERO, FNP     Critical care time: 65 minutes     Robet Kim, PA-C [facility] PCCM Team Contact Info: [phone]         [1]  Allergies Allergen Reactions   Morphine  And Codeine Hives   Levaquin [Levofloxacin] Itching and Other (See Comments)    Severe itching; prickly sensation; severe left leg pain; immobility.   Enalapril  Other (See Comments)    hallucinations    Latex Rash   Tape Rash and Other (See Comments)   "

## 2024-06-23 NOTE — Consult Note (Signed)
 " [facility] Vascular Consult Note  MRN : [id number]  [name] is a 64 y.o. (12/15/1960) female who presents with chief complaint of  Chief Complaint  Patient presents with   Loss of Consciousness   Hypotension  .  History of Present Illness: I am asked to see the patient by Dr. Isaiah for evaluation of left leg pain and possible ischemia.  Patient is well-known to our service and we have followed her for many years now.  We have performed her dialysis access as well as assessed her left leg for ischemia due to intractable pain.  She had a normal left lower extremity angiogram less than 2 months ago.  She has been complaining of severe pain in the left foot while in hospital.  She is currently so sleepy she barely arouses.  She is getting a cardiac echocardiogram which is showing markedly reduced cardiac function at this time.  She is having severe and life-threatening arrhythmias.  Cardiology is coming to see the patient currently.  She remains hypotensive on pressors worsening her peripheral perfusion as well.  Current Facility-Administered Medications  Medication Dose Route Frequency Provider Last Rate Last Admin   amiodarone  (NEXTERONE  PREMIX) 360-4.14 MG/200ML-% (1.8 mg/mL) IV infusion  60 mg/hr Intravenous Continuous Keene, Jeremiah D, NP 33.3 mL/hr at 06/23/24 0757 60 mg/hr at 06/23/24 0757   amiodarone  (NEXTERONE  PREMIX) 360-4.14 MG/200ML-% (1.8 mg/mL) IV infusion  30 mg/hr Intravenous Continuous Keene, Jeremiah D, NP       apixaban  (ELIQUIS ) tablet 5 mg  5 mg Oral BID Bousman, Karlie, PA-C   5 mg at 06/23/24 0129   Chlorhexidine  Gluconate Cloth 2 % PADS 6 each  6 each Topical Daily Bousman, Karlie, PA-C       HYDROmorphone  (DILAUDID ) injection 0.5-1 mg  0.5-1 mg Intravenous Q4H PRN Keene, Jeremiah D, NP   1 mg at 06/23/24 0723   ipratropium-albuterol  (DUONEB) 0.5-2.5 (3) MG/3ML nebulizer solution 3 mL  3 mL Nebulization Q4H PRN Bousman, Karlie, PA-C        levothyroxine  (SYNTHROID ) tablet 75 mcg  75 mcg Oral Q0600 Bousman, Karlie, PA-C   75 mcg at 06/23/24 0636   midodrine  (PROAMATINE ) tablet 10 mg  10 mg Oral TID WC Dail Rankin RAMAN, RPH   10 mg at 06/23/24 0741   norepinephrine  (LEVOPHED ) 4mg  in (0.016 mg/mL) premix infusion  0-40 mcg/min Intravenous Continuous Bousman, Karlie, PA-C 33.8 mL/hr at 06/23/24 0757 9 mcg/min at 06/23/24 0757   oxyCODONE -acetaminophen  (PERCOCET/ROXICET) 5-325 MG per tablet 1 tablet  1 tablet Oral Once Bousman, Karlie, PA-C       polyethylene glycol (MIRALAX  / GLYCOLAX ) packet 17 g  17 g Oral Daily PRN Bousman, Karlie, PA-C       rosuvastatin  (CRESTOR ) tablet 20 mg  20 mg Oral QHS Bousman, Karlie, PA-C   20 mg at 06/23/24 0128   senna (SENOKOT) tablet 8.6 mg  1 tablet Oral BID PRN Bousman, Karlie, PA-C       Facility-Administered Medications Ordered in Other Encounters  Medication Dose Route Frequency Provider Last Rate Last Admin   heparin  injection    PRN Marea Selinda RAMAN, MD   10,000 Units at 04/08/23 1412    Past Medical History:  Diagnosis Date   Acute lacunar infarction (HCC) 03/03/2014   a.) MRI brain 03/03/2014: two acute punctate lacunar infarcts anteriorly in the BILATERAL centrum semiovale   Adult behavior problems    Anemia of chronic renal failure    Aortic  atherosclerosis    Arthritis    Atrial fibrillation and flutter (HCC)    a.) CHA2DS2VASc = 6 (sex, HFrEF, HTN, CVA x 2, prior MI/vascular disease) as of 02/04/2024; b.) rate/rhythm maintained on oral metoprolol  succinate; chronically anticoagulated with apixaban    Bipolar disorder (HCC)    Cardiomegaly    Cerebral microvascular disease    CHF (congestive heart failure) (HCC)    Chicken pox    Cholelithiasis    Chronic respiratory failure with hypoxia (HCC) 12/19/2021   COPD (chronic obstructive pulmonary disease) (HCC)    Coronary artery disease    DDD (degenerative disc disease), cervical    DDD (degenerative disc disease), thoracolumbar     Diverticulosis    Dyspnea    ESRD (end stage renal disease) on dialysis (M,W,F)    GERD (gastroesophageal reflux disease)    Gout    HCV (hepatitis C virus)    Headache    HFrEF (heart failure with reduced ejection fraction) (HCC)    History of delirium 04/12/2014   History of substance abuse (HCC)    a.) tobacco + cocaine   Hyperkalemia 11/13/2014   Hyperlipidemia    Hypertension    Hypotension 02/24/2014   Hypothyroidism    Ischemic cardiomyopathy    Nose colonized with MRSA 02/01/2024   a.) presurgical PCR (+) on 02/01/2024 prior to A/V FISTULAGRAM; b.) presurgical PCR (+) on 03/07/2024 prior to AV FISTULA CREATION   NSTEMI (non-ST elevated myocardial infarction) (HCC) 10/08/2013   NSTEMI (non-ST elevated myocardial infarction) (HCC) 01/23/2002   a.) LHC 01/24/2002 --> multi-vessel CAD with IRA being 90% OM2 --> delayed PCI until 01/25/2002 --> 3.0 x 23 mm BX Velocity stent   Obesity    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    On apixaban  therapy    Peripheral vascular disease    Pneumonia    Postoperative anemia due to acute blood loss 02/27/2014   Postoperative cerebrovascular infarction following cardiac surgery 02/21/2014   a.) developed RIGHT upper/lower extremity weakness following cardiac revascularization --> neuro consult and head CT --> Hypodensity noted in the area of the LEFT motor cortex consistent with a component of acute ischemia   Pulmonary hypertension (HCC)    RBBB (right bundle branch block)    Renal osteodystrophy    S/P CABG x 3 02/20/2014   a.) LIMA-LAD, SVG-OM1, SVG-PDA   Stroke (HCC) 05/23/2010   a.) brain MRI 05/23/2010: BILATERAL acute infarcts and old prior infarcts; RIGHT frontal lobe extending into the white matter. Small focus of restricted diffusion in the cortex of the medial LEFT frontoparietal lobe. Periventricular T2 and multiple foci of T2 hyperintensity in the subcortical white matter and increased FLAIR signal in the cortex of  the LEFT frontal lobe.   Syncope and collapse 07/25/2020    Past Surgical History:  Procedure Laterality Date   A/V FISTULAGRAM Left 03/30/2019   Procedure: A/V FISTULAGRAM;  Surgeon: Marea Selinda RAMAN, MD;  Location: [facility];  Service: Cardiovascular;  Laterality: Left;   A/V FISTULAGRAM Left 10/06/2019   Procedure: A/V FISTULAGRAM;  Surgeon: Marea Selinda RAMAN, MD;  Location: [facility];  Service: Cardiovascular;  Laterality: Left;   A/V FISTULAGRAM Left 05/19/2021   Procedure: A/V FISTULAGRAM;  Surgeon: Marea Selinda RAMAN, MD;  Location: [facility];  Service: Cardiovascular;  Laterality: Left;   A/V FISTULAGRAM Left 01/29/2022   Procedure: A/V Fistulagram;  Surgeon: Marea Selinda RAMAN, MD;  Location: [facility];  Service: Cardiovascular;  Laterality: Left;   A/V FISTULAGRAM Left 04/30/2022   Procedure: A/V Fistulagram;  Surgeon: Marea Selinda RAMAN, MD;  Location: [facility];  Service: Cardiovascular;  Laterality: Left;   A/V FISTULAGRAM Left 08/20/2022   Procedure: A/V Fistulagram;  Surgeon: Marea Selinda RAMAN, MD;  Location: [facility];  Service: Cardiovascular;  Laterality: Left;   A/V FISTULAGRAM N/A 12/03/2022   Procedure: A/V Fistulagram;  Surgeon: Marea Selinda RAMAN, MD;  Location: [facility];  Service: Cardiovascular;  Laterality: N/A;   A/V FISTULAGRAM Left 02/11/2023   Procedure: A/V Fistulagram;  Surgeon: Marea Selinda RAMAN, MD;  Location: [facility];  Service: Cardiovascular;  Laterality: Left;   A/V FISTULAGRAM Left 02/07/2024   Procedure: A/V Fistulagram;  Surgeon: Marea Selinda RAMAN, MD;  Location: [facility];  Service: Cardiovascular;  Laterality: Left;   COLONOSCOPY     COLONOSCOPY WITH PROPOFOL  N/A 04/22/2021   Procedure: COLONOSCOPY WITH PROPOFOL ;  Surgeon: Maryruth Ole DASEN, MD;  Location: [facility];  Service: Endoscopy;  Laterality: N/A;   CORONARY ANGIOPLASTY WITH STENT PLACEMENT Left 01/25/2002   Procedure: CORONARY  ANGIOPLASTY WITH STENT PLACEMENT; Location: [facility]; Surgeon: Margie Lovelace, MD   CORONARY ARTERY BYPASS GRAFT N/A 02/20/2014   Procedure: CORONARY ARTERY BYPASS GRAFT; Location: [facility]; Surgeon: Reyes Fruits, MD   DIALYSIS FISTULA CREATION     DIALYSIS/PERMA CATHETER INSERTION N/A 04/08/2023   Procedure: DIALYSIS/PERMA CATHETER INSERTION;  Surgeon: Marea Selinda RAMAN, MD;  Location: [facility];  Service: Cardiovascular;  Laterality: N/A;   DIALYSIS/PERMA CATHETER REMOVAL N/A 07/06/2023   Procedure: DIALYSIS/PERMA CATHETER REMOVAL;  Surgeon: [name] Cordella MATSU, MD;  Location: [facility];  Service: Cardiovascular;  Laterality: N/A;   ESOPHAGOGASTRODUODENOSCOPY     ESOPHAGOGASTRODUODENOSCOPY N/A 04/22/2021   Procedure: ESOPHAGOGASTRODUODENOSCOPY (EGD);  Surgeon: Maryruth Ole DASEN, MD;  Location: [facility];  Service: Endoscopy;  Laterality: N/A;   FLEXIBLE SIGMOIDOSCOPY N/A 02/23/2019   Procedure: FLEXIBLE SIGMOIDOSCOPY;  Surgeon: Gaylyn Gladis PENNER, MD;  Location: [facility];  Service: Endoscopy;  Laterality: N/A;   IABP INSERTION  02/20/2014   Procedure: INTRA-AORTIC BALLOON PUMP INSERTION; Location: [facility]; Surgeon: Reyes Fruits, MD   INSERTION OF ARTERIOVENOUS (AV) ARTEGRAFT ARM Right 03/09/2024   Procedure: INSERTION, GRAFT, ARTERIOVENOUS, UPPER EXTREMITY;  Surgeon: Marea Selinda RAMAN, MD;  Location: [facility];  Service: Vascular;  Laterality: Right;  BRACHIAL AXILLARY   INSERTION OF DIALYSIS CATHETER N/A 04/20/2024   Procedure: INSERTION OF DIALYSIS CATHETER;  Surgeon: Marea Selinda RAMAN, MD;  Location: [facility];  Service: Vascular;  Laterality: N/A;   LEFT HEART CATH AND CORONARY ANGIOGRAPHY Left 01/24/2002   Procedure: LEFT HEART CATH AND CORONARY ANGIOGRAPHY; Location: [facility]; Surgeon: Margie Lovelace, MD   LEFT HEART CATH AND CORONARY ANGIOGRAPHY Left 05/25/2012   Procedure: LEFT HEART CATH AND CORONARY ANGIOGRAPHY; Location: [facility]; Surgeon: Margie Lovelace, MD   LEFT HEART CATH AND  CORONARY ANGIOGRAPHY Left 10/09/2013   Procedure: LEFT HEART CATH AND CORONARY ANGIOGRAPHY; Location: [facility]; Surgeon: Vinie Jude, MD   LEFT HEART CATH AND CORS/GRAFTS ANGIOGRAPHY N/A 08/08/2018   Procedure: LEFT HEART CATH AND CORS/GRAFTS ANGIOGRAPHY;  Surgeon: Fernand Denyse LABOR, MD;  Location: [facility];  Service: Cardiovascular;  Laterality: N/A;   LEFT HEART CATH AND CORS/GRAFTS ANGIOGRAPHY N/A 01/30/2019   Procedure: LEFT HEART CATH AND CORS/GRAFTS ANGIOGRAPHY;  Surgeon: Fernand Denyse LABOR, MD;  Location: [facility];  Service: Cardiovascular;  Laterality: N/A;   LIGATION ARTERIOVENOUS GORTEX GRAFT Right 04/20/2024  Procedure: LIGATION ARTERIOVENOUS GORTEX GRAFT;  Surgeon: Marea Selinda RAMAN, MD;  Location: [facility];  Service: Vascular;  Laterality: Right;   LOWER EXTREMITY ANGIOGRAPHY Left 04/24/2024   Procedure: Lower Extremity Angiography;  Surgeon: Marea Selinda RAMAN, MD;  Location: [facility];  Service: Cardiovascular;  Laterality: Left;   ultrasound guided pericardiocentesis     UPPER EXTREMITY ANGIOGRAPHY Right 03/30/2024   Procedure: Upper Extremity Angiography;  Surgeon: Marea Selinda RAMAN, MD;  Location: [facility];  Service: Cardiovascular;  Laterality: Right;    Social History Social History[1]  Family History Family History  Problem Relation Age of Onset   Hypertension Mother    Ovarian cancer Mother    Diabetes type II Mother    Hypertension Father    Kidney disease Father    Hypertension Sister    Diabetes type II Maternal Grandmother    Breast cancer Maternal Grandmother    Breast cancer Maternal Aunt    Hypertension Other    Cancer Other    Renal Disease Other     Allergies[2]   REVIEW OF SYSTEMS (Negative unless checked)  Constitutional: [] Weight loss  [] Fever  [] Chills Cardiac: [] Chest pain   [] Chest pressure   [x] Palpitations   [] Shortness of breath when laying flat   [x] Shortness of breath at rest   [x] Shortness of breath with  exertion. Vascular:  [] Pain in legs with walking   [] Pain in legs at rest   [] Pain in legs when laying flat   [] Claudication   [x] Pain in feet when walking  [x] Pain in feet at rest  [x] Pain in feet when laying flat   [] History of DVT   [] Phlebitis   [] Swelling in legs   [] Varicose veins   [] Non-healing ulcers Pulmonary:   [] Uses home oxygen    [] Productive cough   [] Hemoptysis   [] Wheeze  [] COPD   [] Asthma Neurologic:  [] Dizziness  [] Blackouts   [] Seizures   [x] History of stroke   [] History of TIA  [] Aphasia   [] Temporary blindness   [] Dysphagia   [] Weakness or numbness in arms   [] Weakness or numbness in legs Musculoskeletal:  [] Arthritis   [] Joint swelling   [x] Joint pain   [] Low back pain Hematologic:  [] Easy bruising  [] Easy bleeding   [] Hypercoagulable state   [x] Anemic  [] Hepatitis Gastrointestinal:  [] Blood in stool   [] Vomiting blood  [x] Gastroesophageal reflux/heartburn   [] Difficulty swallowing. Genitourinary:  [x] Chronic kidney disease   [] Difficult urination  [] Frequent urination  [] Burning with urination   [] Blood in urine Skin:  [] Rashes   [] Ulcers   [] Wounds Psychological:  [] History of anxiety   []  History of major depression.  Physical Examination  Vitals:   06/23/24 0600 06/23/24 0715 06/23/24 0730 06/23/24 0745  BP: 100/66 103/88 (!) 71/51 (!) 83/62  Pulse:   (!) 52 (!) 57  Resp: 13 14 15 13   Temp:      TempSrc:      SpO2:   94% 100%  Weight:      Height:       Body mass index is 26.55 kg/m. Gen:   somnolent, critically ill-appearing female Head: [place]/AT, No temporalis wasting.  Ear/Nose/Throat: Hearing grossly intact, nares w/o erythema or drainage, oropharynx w/o Erythema/Exudate Eyes: Sclera non-icteric, conjunctiva clear Neck: Trachea midline.  Pulmonary:  Good air movement, respirations not labored, equal bilaterally.  Cardiac: Rhythm markedly irregular Vascular: PermCath is present in the right chest Vessel Right Left  Radial Palpable Palpable  PT Not palpable Not palpable  DP Not palpable Not palpable   Musculoskeletal: Diffusely weak.  Both feet are cool with diminished capillary refill.  No open ulcerations.  No palpable pulses at this time but the patient is hypotensive on pressors. Neurologic: Sensation grossly intact in extremities.  Symmetrical.  Speech is present but she is so somnolent she cannot hold a conversation.  Psychiatric: Judgment and insight are difficult to assess due to her somnolence Dermatologic: No rashes or ulcers noted.  No cellulitis or open wounds.      CBC Lab Results  Component Value Date   WBC 6.2 06/23/2024   HGB 12.5 06/23/2024   HCT 38.8 06/23/2024   MCV 100.8 (H) 06/23/2024   PLT 125 (L) 06/23/2024    BMET    Component Value Date/Time   NA 137 06/22/2024 2128   NA 139 07/09/2014 0503   K 4.8 06/22/2024 2128   K 4.7 07/11/2014 0958   CL 96 (L) 06/22/2024 2128   CL 98 07/09/2014 0503   CO2 19 (L) 06/22/2024 2128   CO2 30 07/09/2014 0503   GLUCOSE 90 06/22/2024 2128   GLUCOSE 107 (H) 07/09/2014 0503   BUN 35 (H) 06/22/2024 2128   BUN 39 (H) 07/09/2014 0503   CREATININE 6.26 (H) 06/22/2024 2128   CREATININE 5.53 (H) 07/09/2014 0503   CALCIUM  9.8 06/22/2024 2128   CALCIUM  8.3 (L) 07/09/2014 0503   GFRNONAA 7 (L) 06/22/2024 2128   GFRNONAA 9 (L) 07/09/2014 0503   GFRNONAA 9 (L) 02/11/2014 0300   GFRAA 6 (L) 02/06/2020 0708   GFRAA 10 (L) 07/09/2014 0503   GFRAA 10 (L) 02/11/2014 0300   Estimated Creatinine Clearance: 9.7 mL/min (A) (by C-G formula based on SCr of 6.26 mg/dL (H)).  COAG Lab Results  Component Value Date   INR 1.6 (H) 05/17/2023   INR 1.6 (H) 02/16/2022   INR 1.6 (H) 08/07/2021    Radiology CT Cervical Spine Wo Contrast Result Date: 06/22/2024 EXAM: CT CERVICAL SPINE WITHOUT CONTRAST 06/22/2024 09:55:46 PM TECHNIQUE: CT of the cervical spine was performed without the administration of intravenous contrast. Multiplanar reformatted images  are provided for review. Automated exposure control, iterative reconstruction, and/or weight based adjustment of the mA/kV was utilized to reduce the radiation dose to as low as reasonably achievable. COMPARISON: None available. CLINICAL HISTORY: Polytrauma, blunt. FINDINGS: BONES AND ALIGNMENT: Reversal of the normal mid cervical lordosis, chronic. No acute fracture or traumatic malalignment. DEGENERATIVE CHANGES: Severe erosive endplate changes at C5-C6, chronic but progressive when compared to remote prior. Differential considerations include chronic osteomyelitis versus severe degenerative changes with bony remodeling. SOFT TISSUES: No prevertebral soft tissue swelling. IMPRESSION: 1. No traumatic injury to the cervical spine. 2. Severe erosive endplate changes at C5-C6, chronic but progressive. Differential considerations include chronic osteomyelitis versus severe degenerative changes with bony remodeling. Correlate with laboratory evaluation to exclude infection. Electronically signed by: Pinkie Pebbles MD MD 06/22/2024 10:07 PM EST RP Workstation: [id number]   CT HEAD WO CONTRAST ( ) Result Date: 06/22/2024 EXAM: CT HEAD WITHOUT CONTRAST 06/22/2024 09:55:46 PM TECHNIQUE: CT of the head was performed without the administration of intravenous contrast. Automated exposure control, iterative reconstruction, and/or weight based adjustment of the mA/kV was utilized to reduce the radiation dose to as low as reasonably achievable. COMPARISON: MRI brain dated 08/16/2023. CLINICAL HISTORY: Polytrauma, blunt. FINDINGS: BRAIN AND VENTRICLES: No acute hemorrhage. No evidence of acute infarct. Remote infarcts in bilateral frontal lobes and right parietal lobe. Moderate white matter disease.  Intracranial atherosclerosis. No hydrocephalus. No extra-axial collection. No mass effect or midline shift. ORBITS: No acute abnormality. SINUSES: No acute abnormality. SOFT TISSUES AND SKULL: No acute soft tissue abnormality. No  skull fracture. IMPRESSION: 1. No acute intracranial abnormality. 2. 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 Electronically signed by: Pinkie Pebbles MD MD 06/22/2024 10:03 PM EST RP Workstation: [id number]   DG Chest Portable 1 View Result Date: 06/22/2024 EXAM: 1 VIEW XRAY OF THE CHEST 06/22/2024 09:30:41 PM COMPARISON: 02/28/2024. Prior CAG. CLINICAL HISTORY: weakness FINDINGS: LINES, TUBES AND DEVICES: Right dialysis catheter tip in the right atrium. LUNGS AND PLEURA: Vascular congestion. No overt edema. No focal pulmonary opacity. No pleural effusion. No pneumothorax. HEART AND MEDIASTINUM: Cardiomegaly. BONES AND SOFT TISSUES: No acute osseous abnormality. IMPRESSION: 1. Cardiomegaly with vascular congestion without overt edema. Electronically signed by: Franky Crease MD 06/22/2024 09:35 PM EST RP Workstation: [id number]      Assessment/Plan 1.  Left foot pain.  This has been a chronic problem for several months.  With her incredibly compromised cardiopulmonary status currently, I am sure her perfusion is suboptimal but I do not think there is much that we will be able to do.  She had a normal angiogram of the left lower extremity less than 2 months ago.  She is not clinically stable for any further evaluation of the left leg at this time invasively or even with a CT angiogram.  I suspect that any malperfusion now is due to her incredibly compromised cardiac status currently.  If heparin  is not contraindicated, this is certainly reasonable from a vascular standpoint.  If she is profoundly ischemic, there is very little we can do currently and we would have to allow for demarcation and less she rapidly improves clinically from a medical and cardiopulmonary standpoint 2.  End-stage renal disease.  Longstanding.  Had steal syndrome of the right arm and had to have her access ligated.  Now catheter dependent. 3.  Cardiac arrhythmia.  Appears to be in some sort of nonsustained V. tach with marked irregularity.   Cardiology to see her today.   Selinda Gu, MD  06/23/2024 8:29 AM    This note was created with Dragon medical transcription system.  Any error is purely unintentional    [1]  Social History Tobacco Use   Smoking status: Every Day    Current packs/day: 0.25    Average packs/day: 0.3 packs/day for 20.0 years (5.0 ttl pk-yrs)    Types: Cigarettes   Smokeless tobacco: Never   Tobacco comments:    smokes 1 - 1.5 cigarettes a day  Vaping Use   Vaping status: Never Used  Substance Use Topics   Alcohol use: No    Comment: social   Drug use: No  [2]  Allergies Allergen Reactions   Morphine  And Codeine Hives   Levaquin [Levofloxacin] Itching and Other (See Comments)    Severe itching; prickly sensation; severe left leg pain; immobility.   Enalapril  Other (See Comments)    hallucinations   Latex Rash   Tape Rash and Other (See Comments)   "

## 2024-06-23 NOTE — Progress Notes (Signed)
 " [facility]  ROUNDING NOTE   Subjective:   [name] is a 64 year old African-American female with past medical histories including CAD with CABG and end-stage renal disease on dialysis.  Patient presents to the emergency department after suffering a syncopal episode.  Patient has been admitted for Syncope [R55] NSTEMI (non-ST elevated myocardial infarction) (HCC) [I21.4] Hypotension, unspecified hypotension type [I95.9]   Patient is known to our clinic and receives outpatient dialysis treatments at [facility] on MWF schedule, supervised by Dr. Dennise.  Patient states she does not remember episode.  States she just awoke on the floor.  Unaware if she hit her head.  Reports she has been missing dialysis treatments due to increased lower extremity pain.  She reports 1 missed treatment every 1 to 2 weeks.  Labs on ED arrival significant for serum bicarb 19, creatinine 6.26 with GFR 7, BUN 35, troponin 154, respiratory panel negative for influenza, COVID-19, and RSV.  MRSA PCR positive.  Chest x-ray shows vascular congestion without edema.  CT head negative.  CT spine shows age-related changes.  Patient placed in ICU receiving pressor support with Levophed  and amiodarone  drip.  We have been consulted to manage dialysis needs.  Objective:  Vital signs in last 24 hours:  Temp:  [97.4 F (36.3 C)-98 F (36.7 C)] 97.8 F (36.6 C) (01/09 1200) Pulse Rate:  [25-126] 58 (01/09 1415) Resp:  [9-21] 10 (01/09 1415) BP: (69-144)/(27-114) 98/74 (01/09 1415) SpO2:  [65 %-100 %] 99 % (01/09 1415) Weight:  [72.8 kg-77.4 kg] 77.4 kg (01/09 1333)  Weight change:  Filed Weights   06/22/24 2330 06/23/24 0403 06/23/24 1333  Weight: 74.6 kg 74.6 kg 77.4 kg    Intake/Output: I/O last 3 completed shifts: In: 590.8 [I.V.:90.8; IV Piggyback:500] Out: -    Intake/Output this shift:  Total I/O In: 168.2 [I.V.:168.2] Out: -   Physical Exam: General: NAD  Head:  Normocephalic, atraumatic. Moist oral mucosal membranes  Eyes: Anicteric  Lungs:  Clear to auscultation  Heart: Regular rate and rhythm  Abdomen:  Soft, nontender  Extremities: No peripheral edema.  Neurologic: Awake, alert, conversant  Skin: Warm,dry, no rash  Access: Right chest PermCath    Basic Metabolic Panel: Recent Labs  Lab 06/22/24 2128 06/23/24 0349 06/23/24 0902  NA 137  --   --   K 4.8  --  3.8  CL 96*  --   --   CO2 19*  --   --   GLUCOSE 90  --   --   BUN 35*  --   --   CREATININE 6.26*  --   --   CALCIUM  9.8  --   --   MG  --  2.0 2.0  PHOS  --  6.4*  --     Liver Function Tests: Recent Labs  Lab 06/22/24 2128  AST 49*  ALT 27  ALKPHOS 85  BILITOT 0.7  PROT 7.4  ALBUMIN  4.0   No results for input(s): LIPASE, AMYLASE in the last 168 hours. No results for input(s): AMMONIA in the last 168 hours.  CBC: Recent Labs  Lab 06/22/24 2128 06/23/24 0349  WBC 5.8 6.2  HGB 13.6 12.5  HCT 43.6 38.8  MCV 102.6* 100.8*  PLT 116* 125*    Cardiac Enzymes: No results for input(s): CKTOTAL, CKMB, CKMBINDEX, TROPONINI in the last 168 hours.  BNP: Invalid input(s): POCBNP  CBG: Recent Labs  Lab 06/22/24 2347 06/23/24 0345 06/23/24 0736 06/23/24 1124  GLUCAP 76 88 97 97    Microbiology: Results for orders placed or performed during the hospital encounter of 06/22/24  Resp panel by RT-PCR (RSV, Flu A&B, Covid) Anterior Nasal Swab     Status: None   Collection Time: 06/22/24  9:28 PM   Specimen: Anterior Nasal Swab  Result Value Ref Range Status   SARS Coronavirus 2 by RT PCR NEGATIVE NEGATIVE Final    Comment: (NOTE) SARS-CoV-2 target nucleic acids are NOT DETECTED.  The SARS-CoV-2 RNA is generally detectable in upper respiratory specimens during the acute phase of infection. The lowest concentration of SARS-CoV-2 viral copies this assay can detect is 138 copies/mL. A negative result does not preclude SARS-Cov-2 infection  and should not be used as the sole basis for treatment or other patient management decisions. A negative result may occur with  improper specimen collection/handling, submission of specimen other than nasopharyngeal swab, presence of viral mutation(s) within the areas targeted by this assay, and inadequate number of viral copies(<138 copies/mL). A negative result must be combined with clinical observations, patient history, and epidemiological information. The expected result is Negative.  Fact Sheet for Patients:  bloggercourse.com  Fact Sheet for Healthcare Providers:  seriousbroker.it  This test is no t yet approved or cleared by the [place]  FDA and  has been authorized for detection and/or diagnosis of SARS-CoV-2 by FDA under an Emergency Use Authorization (EUA). This EUA will remain  in effect (meaning this test can be used) for the duration of the COVID-19 declaration under Section 564(b)(1) of the Act, 21 U.S.C.section 360bbb-3(b)(1), unless the authorization is terminated  or revoked sooner.       Influenza A by PCR NEGATIVE NEGATIVE Final   Influenza B by PCR NEGATIVE NEGATIVE Final    Comment: (NOTE) The Xpert Xpress SARS-CoV-2/FLU/RSV plus assay is intended as an aid in the diagnosis of influenza from Nasopharyngeal swab specimens and should not be used as a sole basis for treatment. Nasal washings and aspirates are unacceptable for Xpert Xpress SARS-CoV-2/FLU/RSV testing.  Fact Sheet for Patients: bloggercourse.com  Fact Sheet for Healthcare Providers: seriousbroker.it  This test is not yet approved or cleared by the [place]  FDA and has been authorized for detection and/or diagnosis of SARS-CoV-2 by FDA under an Emergency Use Authorization (EUA). This EUA will remain in effect (meaning this test can be used) for the duration of the COVID-19 declaration  under Section 564(b)(1) of the Act, 21 U.S.C. section 360bbb-3(b)(1), unless the authorization is terminated or revoked.     Resp Syncytial Virus by PCR NEGATIVE NEGATIVE Final    Comment: (NOTE) Fact Sheet for Patients: bloggercourse.com  Fact Sheet for Healthcare Providers: seriousbroker.it  This test is not yet approved or cleared by the [place]  FDA and has been authorized for detection and/or diagnosis of SARS-CoV-2 by FDA under an Emergency Use Authorization (EUA). This EUA will remain in effect (meaning this test can be used) for the duration of the COVID-19 declaration under Section 564(b)(1) of the Act, 21 U.S.C. section 360bbb-3(b)(1), unless the authorization is terminated or revoked.  Performed at [facility], [address] [place], [place] [address]   MRSA Next Gen by PCR, Nasal     Status: Abnormal   Collection Time: 06/22/24 11:51 PM   Specimen: Nasal Mucosa; Nasal Swab  Result Value Ref Range Status   MRSA by PCR Next Gen DETECTED (A) NOT DETECTED Final    Comment: RESULT CALLED TO, READ BACK BY AND VERIFIED WITH: ABIGAIL  FOWLKE @0215  ON 06/23/24 SKL (NOTE) The GeneXpert MRSA Assay (FDA approved for NASAL specimens only), is one component of a comprehensive MRSA colonization surveillance program. It is not intended to diagnose MRSA infection nor to guide or monitor treatment for MRSA infections. Test performance is not FDA approved in patients less than 45 years old. Performed at [facility], [address] [place], [place] [address]     Coagulation Studies: No results for input(s): LABPROT, INR in the last 72 hours.  Urinalysis: No results for input(s): COLORURINE, LABSPEC, PHURINE, GLUCOSEU, HGBUR, BILIRUBINUR, KETONESUR, PROTEINUR, UROBILINOGEN, NITRITE, LEUKOCYTESUR in the last 72 hours.  Invalid input(s): APPERANCEUR    Imaging: CT Cervical  Spine Wo Contrast Result Date: 06/22/2024 EXAM: CT CERVICAL SPINE WITHOUT CONTRAST 06/22/2024 09:55:46 PM TECHNIQUE: CT of the cervical spine was performed without the administration of intravenous contrast. Multiplanar reformatted images are provided for review. Automated exposure control, iterative reconstruction, and/or weight based adjustment of the mA/kV was utilized to reduce the radiation dose to as low as reasonably achievable. COMPARISON: None available. CLINICAL HISTORY: Polytrauma, blunt. FINDINGS: BONES AND ALIGNMENT: Reversal of the normal mid cervical lordosis, chronic. No acute fracture or traumatic malalignment. DEGENERATIVE CHANGES: Severe erosive endplate changes at C5-C6, chronic but progressive when compared to remote prior. Differential considerations include chronic osteomyelitis versus severe degenerative changes with bony remodeling. SOFT TISSUES: No prevertebral soft tissue swelling. IMPRESSION: 1. No traumatic injury to the cervical spine. 2. Severe erosive endplate changes at C5-C6, chronic but progressive. Differential considerations include chronic osteomyelitis versus severe degenerative changes with bony remodeling. Correlate with laboratory evaluation to exclude infection. Electronically signed by: Pinkie Pebbles MD MD 06/22/2024 10:07 PM EST RP Workstation: [id number]   CT HEAD WO CONTRAST ( ) Result Date: 06/22/2024 EXAM: CT HEAD WITHOUT CONTRAST 06/22/2024 09:55:46 PM TECHNIQUE: CT of the head was performed without the administration of intravenous contrast. Automated exposure control, iterative reconstruction, and/or weight based adjustment of the mA/kV was utilized to reduce the radiation dose to as low as reasonably achievable. COMPARISON: MRI brain dated 08/16/2023. CLINICAL HISTORY: Polytrauma, blunt. FINDINGS: BRAIN AND VENTRICLES: No acute hemorrhage. No evidence of acute infarct. Remote infarcts in bilateral frontal lobes and right parietal lobe. Moderate white matter  disease. Intracranial atherosclerosis. No hydrocephalus. No extra-axial collection. No mass effect or midline shift. ORBITS: No acute abnormality. SINUSES: No acute abnormality. SOFT TISSUES AND SKULL: No acute soft tissue abnormality. No skull fracture. IMPRESSION: 1. No acute intracranial abnormality. 2. 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 Electronically signed by: Pinkie Pebbles MD MD 06/22/2024 10:03 PM EST RP Workstation: [id number]   DG Chest Portable 1 View Result Date: 06/22/2024 EXAM: 1 VIEW XRAY OF THE CHEST 06/22/2024 09:30:41 PM COMPARISON: 02/28/2024. Prior CAG. CLINICAL HISTORY: weakness FINDINGS: LINES, TUBES AND DEVICES: Right dialysis catheter tip in the right atrium. LUNGS AND PLEURA: Vascular congestion. No overt edema. No focal pulmonary opacity. No pleural effusion. No pneumothorax. HEART AND MEDIASTINUM: Cardiomegaly. BONES AND SOFT TISSUES: No acute osseous abnormality. IMPRESSION: 1. Cardiomegaly with vascular congestion without overt edema. Electronically signed by: Franky Crease MD 06/22/2024 09:35 PM EST RP Workstation: [id number]     Medications:    amiodarone  30 mg/hr (06/23/24 1358)   norepinephrine  (LEVOPHED ) Adult infusion 7 mcg/min (06/23/24 1240)    apixaban   5 mg Oral BID   Chlorhexidine  Gluconate Cloth  6 each Topical Q0600   levothyroxine   75 mcg Oral Q0600   midodrine   10 mg Oral TID WC   oxyCODONE -acetaminophen   1 tablet Oral Once  rosuvastatin   20 mg Oral QHS   heparin , HYDROmorphone  (DILAUDID ) injection, ipratropium-albuterol , lidocaine -prilocaine , pentafluoroprop-tetrafluoroeth, polyethylene glycol, senna  Assessment/ Plan:  [name] is a 64 y.o.  female with past medical histories including CAD with CABG and end-stage renal disease on dialysis.  Patient presents to the emergency department after suffering a syncopal episode.  Patient has been admitted for Syncope [R55] NSTEMI (non-ST elevated myocardial infarction) (HCC)  [I21.4] Hypotension, unspecified hypotension type [I95.9]  [facility]/MWF/right PermCath/72.5 kg  End-stage renal disease on hemodialysis.  Last treatment received on Wednesday.  Patient scheduled to receive dialysis treatment today, UF 1 to 1.5 L as tolerated.  Next treatment scheduled for Monday.  2.  Syncopal episode with collapse.  Unknown head injury.  CT head and spine negative for acute findings.  Blood pressure support with Levophed .  Echo pending.  Currently prescribed midodrine  10 mg 3 times daily.  3. Anemia of chronic kidney disease Lab Results  Component Value Date   HGB 12.5 06/23/2024    Hemoglobin stable.  No need for ESA's.  4. Secondary Hyperparathyroidism: with outpatient labs: PTH 1577, phosphorus 8.7, calcium  9.3 on 05/22/24.   Lab Results  Component Value Date   PTH 627 (H) 07/26/2020   CALCIUM  9.8 06/22/2024   CAION 0.99 (L) 04/20/2024   PHOS 6.4 (H) 06/23/2024    Will continue to monitor bone minerals during this admission.  Phosphorus elevated.  Patient prescribed calcitriol and sevelamer  outpatient.   LOS: 1 Yordy Matton 1/9/20262:25 PM  . "

## 2024-06-23 NOTE — Progress Notes (Signed)
 PHARMACY CONSULT NOTE - ELECTROLYTES  Pharmacy Consult for Electrolyte Monitoring and Replacement   Recent Labs: Height: 5' 6 (167.6 cm) Weight: 74.6 kg (164 lb 7.4 oz) IBW/kg (Calculated) : 59.3 Estimated Creatinine Clearance: 9.7 mL/min (A) (by C-G formula based on SCr of 6.26 mg/dL (H)). Potassium (mmol/L)  Date Value  06/23/2024 3.8  07/11/2014 4.7   Magnesium (mg/dL)  Date Value  98/90/7973 2.0  04/10/2014 1.9   Calcium  (mg/dL)  Date Value  98/91/7973 9.8   Calcium , Total (mg/dL)  Date Value  98/74/7983 8.3 (L)   Albumin  (g/dL)  Date Value  98/91/7973 4.0  07/07/2014 3.1 (L)   Phosphorus (mg/dL)  Date Value  98/90/7973 6.4 (H)  07/09/2014 3.9   Sodium (mmol/L)  Date Value  06/22/2024 137  07/09/2014 139   Corrected Ca: 9.8 mg/dL  Assessment  [name] is a 64 y.o. female presenting with syncopal episode. PMH significant for  ESRD on HD (MWF), COPD, CHF, bipolar disorder, chronic hypotension on midodrine , HLD and prior CVA . Pharmacy has been consulted to monitor and replace electrolytes.  Diet: NPO MIVF: N/A Pertinent medications: N/A  Goal of Therapy: Electrolytes WNL  Plan:  No supplementation needed at this time Check BMP, Mg, Phos with AM labs  Thank you for allowing pharmacy to be a part of this patient's care.  Lum VEAR Mania, PharmD Clinical Pharmacist 06/23/2024 11:35 AM

## 2024-06-24 DIAGNOSIS — I214 Non-ST elevation (NSTEMI) myocardial infarction: Secondary | ICD-10-CM

## 2024-06-24 DIAGNOSIS — Z515 Encounter for palliative care: Secondary | ICD-10-CM

## 2024-06-24 DIAGNOSIS — I959 Hypotension, unspecified: Secondary | ICD-10-CM | POA: Diagnosis not present

## 2024-06-24 DIAGNOSIS — Z789 Other specified health status: Secondary | ICD-10-CM

## 2024-06-24 DIAGNOSIS — Z7189 Other specified counseling: Secondary | ICD-10-CM | POA: Diagnosis not present

## 2024-06-24 DIAGNOSIS — R55 Syncope and collapse: Secondary | ICD-10-CM | POA: Diagnosis not present

## 2024-06-24 DIAGNOSIS — R579 Shock, unspecified: Secondary | ICD-10-CM

## 2024-06-24 DIAGNOSIS — J9622 Acute and chronic respiratory failure with hypercapnia: Secondary | ICD-10-CM

## 2024-06-24 LAB — CBC
HCT: 36.8 % (ref 36.0–46.0)
Hemoglobin: 11.9 g/dL — ABNORMAL LOW (ref 12.0–15.0)
MCH: 31.8 pg (ref 26.0–34.0)
MCHC: 32.3 g/dL (ref 30.0–36.0)
MCV: 98.4 fL (ref 80.0–100.0)
Platelets: 118 K/uL — ABNORMAL LOW (ref 150–400)
RBC: 3.74 MIL/uL — ABNORMAL LOW (ref 3.87–5.11)
RDW: 18.4 % — ABNORMAL HIGH (ref 11.5–15.5)
WBC: 6.8 K/uL (ref 4.0–10.5)
nRBC: 0 % (ref 0.0–0.2)

## 2024-06-24 LAB — THYROID PANEL WITH TSH
Free Thyroxine Index: 2.3 (ref 1.2–4.9)
T3 Uptake Ratio: 30 % (ref 24–39)
T4, Total: 7.7 ug/dL (ref 4.5–12.0)
TSH: 2.45 u[IU]/mL (ref 0.450–4.500)

## 2024-06-24 LAB — RENAL FUNCTION PANEL
Albumin: 4.1 g/dL (ref 3.5–5.0)
Anion gap: 16 — ABNORMAL HIGH (ref 5–15)
BUN: 25 mg/dL — ABNORMAL HIGH (ref 8–23)
CO2: 25 mmol/L (ref 22–32)
Calcium: 9.9 mg/dL (ref 8.9–10.3)
Chloride: 95 mmol/L — ABNORMAL LOW (ref 98–111)
Creatinine, Ser: 4.79 mg/dL — ABNORMAL HIGH (ref 0.44–1.00)
GFR, Estimated: 10 mL/min — ABNORMAL LOW
Glucose, Bld: 124 mg/dL — ABNORMAL HIGH (ref 70–99)
Phosphorus: 4.6 mg/dL (ref 2.5–4.6)
Potassium: 3.6 mmol/L (ref 3.5–5.1)
Sodium: 137 mmol/L (ref 135–145)

## 2024-06-24 LAB — GLUCOSE, CAPILLARY
Glucose-Capillary: 135 mg/dL — ABNORMAL HIGH (ref 70–99)
Glucose-Capillary: 138 mg/dL — ABNORMAL HIGH (ref 70–99)
Glucose-Capillary: 130 mg/dL — ABNORMAL HIGH (ref 70–99)
Glucose-Capillary: 89 mg/dL (ref 70–99)
Glucose-Capillary: 100 mg/dL — ABNORMAL HIGH (ref 70–99)

## 2024-06-24 MED ORDER — ONDANSETRON HCL 4 MG/2ML IJ SOLN
4.0000 mg | Freq: Four times a day (QID) | INTRAMUSCULAR | Status: DC | PRN
  Administered 2024-06-24 (×2): 4 mg via INTRAVENOUS
  Filled 2024-06-24 (×2): qty 2

## 2024-06-24 MED ORDER — OXYCODONE-ACETAMINOPHEN 5-325 MG PO TABS
1.0000 | ORAL_TABLET | Freq: Two times a day (BID) | ORAL | Status: DC
  Administered 2024-06-24 – 2024-06-25 (×4): 1 via ORAL
  Filled 2024-06-24 (×4): qty 1

## 2024-06-24 NOTE — Progress Notes (Signed)
 PHARMACY CONSULT NOTE - ELECTROLYTES  Pharmacy Consult for Electrolyte Monitoring and Replacement   Recent Labs: Height: 5' 6 (167.6 cm) Weight: 75.9 kg (167 lb 5.3 oz) IBW/kg (Calculated) : 59.3 Estimated Creatinine Clearance: 12.8 mL/min (A) (by C-G formula based on SCr of 4.79 mg/dL (H)). Potassium (mmol/L)  Date Value  06/24/2024 3.6  07/11/2014 4.7   Magnesium (mg/dL)  Date Value  98/90/7973 2.0  04/10/2014 1.9   Calcium  (mg/dL)  Date Value  98/89/7973 9.9   Calcium , Total (mg/dL)  Date Value  98/74/7983 8.3 (L)   Albumin  (g/dL)  Date Value  98/89/7973 4.1  07/07/2014 3.1 (L)   Phosphorus (mg/dL)  Date Value  98/89/7973 4.6  07/09/2014 3.9   Sodium (mmol/L)  Date Value  06/24/2024 137  07/09/2014 139   Corrected Ca: 9.8 mg/dL  Assessment  [name] is a 64 y.o. female presenting with syncopal episode. PMH significant for  ESRD on HD (MWF), COPD, CHF, bipolar disorder, chronic hypotension on midodrine , HLD and prior CVA . Pharmacy has been consulted to monitor and replace electrolytes.  Goal of Therapy: Electrolytes WNL  Plan:  No supplementation needed at this time Check BMP, Mg, Phos with AM labs  Thank you for allowing pharmacy to be a part of this patient's care.  Adriana JONETTA Bolster, PharmD Clinical Pharmacist 06/24/2024 8:12 AM

## 2024-06-24 NOTE — Progress Notes (Signed)
 "  NAME:  [name], MRN:  [id number], DOB:  1960/08/06, LOS: 2 ADMISSION DATE:  06/22/2024, CONSULTATION DATE:  06/22/24 REFERRING MD:  Dr. Dorothyann, CHIEF COMPLAINT:  syncope   History of Present Illness:  [name] is a 64 y.o. female with a pmh of of ESRD on HD (MWF), COPD, CHF, bipolar disorder, chronic hypotension on midodrine , HLD and prior CVA who presented to [facility] ED via EMS following syncopal episode.   History was gathered per chart review Per patient, her normal dialysis schedule is MWF. At her most recent session on Wednesday, she was told that her blood pressure was too low to get dialysis.  Patient states prior to arrival she may have lost consciousness, fell to the ground but was unsure if she hit her head. She reported she has been very nauseated with multiple episodes of vomiting prior to arrival and has felt generalized weakness. Per chart review, it is noted she had a history of syncope and collapse in 2023.  ED course: Upon arrival to [facility] ED, the patient was awake and alert, mentating well but hypotensive at 71/44. She was given 1L NS bolus with minimal effect on her blood pressure, therefore levophed  was started. Following fluid resuscitation, she became mildly hypoxic and was started on 3L [place].  Vitals on Arrival: temp 97.9, BP 69/38, HR 28, RR 13, SpO2 98 on 3L [place]  Pertinent Labs/Diagnostics: Revealed AGMA with lactic acidosis, mildly elevated troponin s/t suspected demand ischemia, acute renal failure with chronic thrombocytopenia  I, Robet Kim, PA-C personally viewed and interpreted this ECG. EKG Interpretation: Date: 06/22/24, EKG Time: 2112, Rate: 81, Rhythm: sinus rhythm, QRS Axis: normal axis, mildly widened QRS ST/T Wave abnormalities: non specific ST changes with ectopy, Narrative Interpretation: ventricular bigeminy with RBBB  Chemistry: Na+:137, K+: 4.8, BUN/Cr.: 35/6.26, Serum CO2/ AG: 19/22 AST: 49  Hematology: WBC: 5.8, Hgb: 13.6, Platelets  116 Troponin: 154 >> 155, Lactic/ PCT: 3.0, COVID-19 & Influenza A/B: negative  Resp Panel PCR: negative CXR : cardiomegaly with vascular congestion without edema CT Head: no acute intracranial abnormality; old bilateral infarcts CT C Spine: no traumatic injury to the c spine; severe endplate changes at c5-c6, chronic but progressive - chronic osteomyelitis vs severe degenerative changes  Medications Administered:  1L NS x 1 Levophed  gtt   PCCM consulted for admission due to hypotension requiring levophed  gtt.   Pertinent Medical History  Hypotension & Hypertension HLD HFrEF ESRD on HD Stroke CAD s/p CABG x 3 RBBB Pulmonary hypertension PVD Ischemic cardiomyopathy Hyperkalemia Hypothyroidism Atrial fibrillation on eliquis  Anemia of chronic kidney disease   Significant Hospital Events: Including procedures, antibiotic start and stop dates in addition to other pertinent events   06/22/24: Admitted for circulatory shock requiring levophed  gtt. Transferred to the ICU. On room air.  06/24/24: Still requiring low-dose norepinephrine   Interim History / Subjective:  Awake, pleasant and asking if she can be discharged.  She is still on a low-dose of norepinephrine .  Reports left heel pain from venous ulcer.  She has severe PVD and states that besides her feet hurting, she has no issues this morning.    Objective    Blood pressure (!) 87/64, pulse 79, temperature (!) 97.5 F (36.4 C), temperature source Oral, resp. rate 18, height 5' 6 (1.676 m), weight 75.9 kg, SpO2 97%.        Intake/Output Summary (Last 24 hours) at 06/24/2024 1729 Last data filed at 06/24/2024 1330 Gross per 24 hour  Intake 1093.28  ml  Output 2300 ml  Net -1206.72 ml   Filed Weights   06/23/24 1333 06/23/24 1743 06/24/24 0500  Weight: 77.4 kg 76.3 kg 75.9 kg    Examination: General: Pleasant African-American female, chronically ill looking, in no acute distress . HEENT: atraumatic, normocephalic,  neck supple  Cardiovascular: ventricular rhythm, S1 S2, no r/m/g Pulm: Normal work of breathing, lungs are clear to auscultation bilaterally  GI: soft, non-tender, non-distended, no rebound/guarding, bs x 4 Extremities: warm/dry, pulses + 2 R/P, no edema noted Neuro: A&O x 4, follows commands, no focal neuro deficits, PERRL  GU: deferred  Resolved problem list   Assessment and Plan   #Acute on Chronic Hypoxic Respiratory Failure ~ Improving #COPD -Continue supplemental O2 to maintain SpO2 >92% - Consider NPPV with BIPAP thereapy as needed; currently on room air - Duonebs prn - Wean FiO2 as needed  #Circulatory Shock - cardiogenic vs hypovolemic-off norepinephrine  #HFrEF #Syncope and Collapse (Hx of the same in 2023) #Chronic Hypotension on midodrine  #Acute on Chronic HFrEF exacerbation #Atrial Fibrillation on Eliquis  #Mildly Elevated Troponin s/t Demand Ischemia Hx: Hypotension, HLD, CAD s/p CABG, Ischemic Cardiomyopathy, CVA 06/22/24 CT Head and C Spine: no acute intracranial abnormality or traumatic injury. - Continuous cardiac monitoring -As needed vasopressin  if mean arterial blood pressure drops - EKG prn - Midodrine  10mg  TID - ECHO ordered - BNP pending  #AGMA s/t Lactic acidosis-lactic acid down to 1.9 #ESRD on HD (MWF) #Acute on Chronic Renal Failure Cr on admission 6.26 (5.87 in November 2025) 06/22/24 CXR: cardiomegaly with vascular congestion without overt edema Initial interventions/workup included: 1 L of NS  - Strict I/Os - Trend BMP, Mg and Phosphorus - Trend lactic: 3.0 >> 2.0 - Avoid nephrotoxins as able - Ensure adequate renal perfusion - ICU electrolyte replacement protocol - Pharmacy to assist with replacement as indicate - Nephrology informed via EDP; plan for HD on 1/9  #GERD #Nausea and Vomiting Hx: GERD, HCV, Diverticulosis - Monitor hepatic function as indication - Antiemetics prn - Constipation protocol prn - Diet: NPO except with  meds  #Chronic Thrombocytopenia #VTE prophylaxis - Trend CBC  - Transfuse if Hgb < 7 or platelets <10 - Monitor for s/sx of bleeding - Continue eliquis  5mg  BID  #Chronic Pain s/t Vascular Ulcers - percocet 5-325 mg x 1 - supportive care   Best Practice: Diet/type: Heart healthy VTE prophylaxis:  SCD's /apixaban  GI prophylaxis: Not indicated Lines: PIV's Foley: No Code Status: Full code Last date of multidisciplinary goals of care discussion 06/24/2024 during interdisciplinary rounds  Patient updated on current treatment plan.  PCCM will sign off on 06/25/2024 and transferred to [facility].  Labs   CBC: Recent Labs  Lab 06/22/24 2128 06/23/24 0349 06/24/24 0734  WBC 5.8 6.2 6.8  HGB 13.6 12.5 11.9*  HCT 43.6 38.8 36.8  MCV 102.6* 100.8* 98.4  PLT 116* 125* 118*    Basic Metabolic Panel: Recent Labs  Lab 06/22/24 2128 06/23/24 0349 06/23/24 0902 06/24/24 0734  NA 137  --   --  137  K 4.8  --  3.8 3.6  CL 96*  --   --  95*  CO2 19*  --   --  25  GLUCOSE 90  --   --  124*  BUN 35*  --   --  25*  CREATININE 6.26*  --   --  4.79*  CALCIUM  9.8  --   --  9.9  MG  --  2.0 2.0  --  PHOS  --  6.4*  --  4.6   GFR: Estimated Creatinine Clearance: 12.8 mL/min (A) (by C-G formula based on SCr of 4.79 mg/dL (H)). Recent Labs  Lab 06/22/24 2128 06/23/24 0015 06/23/24 0246 06/23/24 0349 06/23/24 0902 06/23/24 1207 06/24/24 0734  WBC 5.8  --   --  6.2  --   --  6.8  LATICACIDVEN  --  3.0* 2.0*  --  1.5 1.9  --     Liver Function Tests: Recent Labs  Lab 06/22/24 2128 06/24/24 0734  AST 49*  --   ALT 27  --   ALKPHOS 85  --   BILITOT 0.7  --   PROT 7.4  --   ALBUMIN  4.0 4.1   No results for input(s): LIPASE, AMYLASE in the last 168 hours. No results for input(s): AMMONIA in the last 168 hours.  ABG    Component Value Date/Time   HCO3 29.4 (H) 02/25/2018 0848   TCO2 26 04/20/2024 0708   O2SAT 58.6 02/25/2018 0848     Coagulation Profile: No  results for input(s): INR, PROTIME in the last 168 hours.  Cardiac Enzymes: No results for input(s): CKTOTAL, CKMB, CKMBINDEX, TROPONINI in the last 168 hours.  HbA1C: No results found for: HGBA1C  CBG: Recent Labs  Lab 06/23/24 2319 06/24/24 0326 06/24/24 0729 06/24/24 1124 06/24/24 1550  GLUCAP 155* 135* 138* 130* 89    Review of Systems:   Constitutional:  Positive for malaise/fatigue. Negative for chills and fever.  Respiratory:  Negative for cough, shortness of breath and wheezing.   Cardiovascular:  Negative for chest pain, palpitations and leg swelling.  Gastrointestinal:  Positive for nausea. Negative for abdominal pain, constipation and vomiting.  Musculoskeletal:        Left heel pain s/t vascular ulcers  Neurological:  Positive for weakness. Negative for dizziness, tingling and headaches.  Psychiatric/Behavioral: Negative.     Past Medical History:  She,  has a past medical history of Acute lacunar infarction [facility]) (03/03/2014), Adult behavior problems, Anemia of chronic renal failure, Aortic atherosclerosis, Arthritis, Atrial fibrillation and flutter (HCC), Bipolar disorder (HCC), Cardiomegaly, Cerebral microvascular disease, CHF (congestive heart failure) (HCC), Chicken pox, Cholelithiasis, Chronic respiratory failure with hypoxia (HCC) (12/19/2021), COPD (chronic obstructive pulmonary disease) (HCC), Coronary artery disease, DDD (degenerative disc disease), cervical, DDD (degenerative disc disease), thoracolumbar, Diverticulosis, Dyspnea, ESRD (end stage renal disease) on dialysis (M,W,F), GERD (gastroesophageal reflux disease), Gout, HCV (hepatitis C virus), Headache, HFrEF (heart failure with reduced ejection fraction) (HCC), History of delirium (04/12/2014), History of substance abuse (HCC), Hyperkalemia (11/13/2014), Hyperlipidemia, Hypertension, Hypotension (02/24/2014), Hypothyroidism, Ischemic cardiomyopathy, Nose colonized with MRSA (02/01/2024),  NSTEMI (non-ST elevated myocardial infarction) (HCC) (10/08/2013), NSTEMI (non-ST elevated myocardial infarction) (HCC) (01/23/2002), Obesity, 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, On apixaban  therapy, Peripheral vascular disease, Pneumonia, Postoperative anemia due to acute blood loss (02/27/2014), Postoperative cerebrovascular infarction following cardiac surgery (02/21/2014), Pulmonary hypertension (HCC), RBBB (right bundle branch block), Renal osteodystrophy, S/P CABG x 3 (02/20/2014), Stroke (HCC) (05/23/2010), and Syncope and collapse (07/25/2020).   Surgical History:   Past Surgical History:  Procedure Laterality Date   A/V FISTULAGRAM Left 03/30/2019   Procedure: A/V FISTULAGRAM;  Surgeon: Marea Selinda RAMAN, MD;  Location: [facility];  Service: Cardiovascular;  Laterality: Left;   A/V FISTULAGRAM Left 10/06/2019   Procedure: A/V FISTULAGRAM;  Surgeon: Marea Selinda RAMAN, MD;  Location: [facility];  Service: Cardiovascular;  Laterality: Left;   A/V FISTULAGRAM Left 05/19/2021  Procedure: A/V FISTULAGRAM;  Surgeon: Marea Selinda RAMAN, MD;  Location: [facility];  Service: Cardiovascular;  Laterality: Left;   A/V FISTULAGRAM Left 01/29/2022   Procedure: A/V Fistulagram;  Surgeon: Marea Selinda RAMAN, MD;  Location: [facility];  Service: Cardiovascular;  Laterality: Left;   A/V FISTULAGRAM Left 04/30/2022   Procedure: A/V Fistulagram;  Surgeon: Marea Selinda RAMAN, MD;  Location: [facility];  Service: Cardiovascular;  Laterality: Left;   A/V FISTULAGRAM Left 08/20/2022   Procedure: A/V Fistulagram;  Surgeon: Marea Selinda RAMAN, MD;  Location: [facility];  Service: Cardiovascular;  Laterality: Left;   A/V FISTULAGRAM N/A 12/03/2022   Procedure: A/V Fistulagram;  Surgeon: Marea Selinda RAMAN, MD;  Location: [facility];  Service: Cardiovascular;  Laterality: N/A;   A/V FISTULAGRAM Left 02/11/2023   Procedure: A/V Fistulagram;  Surgeon: Marea Selinda RAMAN, MD;   Location: [facility];  Service: Cardiovascular;  Laterality: Left;   A/V FISTULAGRAM Left 02/07/2024   Procedure: A/V Fistulagram;  Surgeon: Marea Selinda RAMAN, MD;  Location: [facility];  Service: Cardiovascular;  Laterality: Left;   COLONOSCOPY     COLONOSCOPY WITH PROPOFOL  N/A 04/22/2021   Procedure: COLONOSCOPY WITH PROPOFOL ;  Surgeon: Maryruth Ole DASEN, MD;  Location: [facility];  Service: Endoscopy;  Laterality: N/A;   CORONARY ANGIOPLASTY WITH STENT PLACEMENT Left 01/25/2002   Procedure: CORONARY ANGIOPLASTY WITH STENT PLACEMENT; Location: [facility]; Surgeon: Margie Lovelace, MD   CORONARY ARTERY BYPASS GRAFT N/A 02/20/2014   Procedure: CORONARY ARTERY BYPASS GRAFT; Location: [facility]; Surgeon: Reyes Fruits, MD   DIALYSIS FISTULA CREATION     DIALYSIS/PERMA CATHETER INSERTION N/A 04/08/2023   Procedure: DIALYSIS/PERMA CATHETER INSERTION;  Surgeon: Marea Selinda RAMAN, MD;  Location: [facility];  Service: Cardiovascular;  Laterality: N/A;   DIALYSIS/PERMA CATHETER REMOVAL N/A 07/06/2023   Procedure: DIALYSIS/PERMA CATHETER REMOVAL;  Surgeon: [name] Cordella MATSU, MD;  Location: [facility];  Service: Cardiovascular;  Laterality: N/A;   ESOPHAGOGASTRODUODENOSCOPY     ESOPHAGOGASTRODUODENOSCOPY N/A 04/22/2021   Procedure: ESOPHAGOGASTRODUODENOSCOPY (EGD);  Surgeon: Maryruth Ole DASEN, MD;  Location: [facility];  Service: Endoscopy;  Laterality: N/A;   FLEXIBLE SIGMOIDOSCOPY N/A 02/23/2019   Procedure: FLEXIBLE SIGMOIDOSCOPY;  Surgeon: Gaylyn Gladis PENNER, MD;  Location: [facility] ENDOSCOPY;  Service: Endoscopy;  Laterality: N/A;   IABP INSERTION  02/20/2014   Procedure: INTRA-AORTIC BALLOON PUMP INSERTION; Location: [facility]; Surgeon: Reyes Fruits, MD   INSERTION OF ARTERIOVENOUS (AV) ARTEGRAFT ARM Right 03/09/2024   Procedure: INSERTION, GRAFT, ARTERIOVENOUS, UPPER EXTREMITY;  Surgeon: Marea Selinda RAMAN, MD;  Location: [facility];  Service: Vascular;  Laterality: Right;  BRACHIAL  AXILLARY   INSERTION OF DIALYSIS CATHETER N/A 04/20/2024   Procedure: INSERTION OF DIALYSIS CATHETER;  Surgeon: Marea Selinda RAMAN, MD;  Location: [facility];  Service: Vascular;  Laterality: N/A;   LEFT HEART CATH AND CORONARY ANGIOGRAPHY Left 01/24/2002   Procedure: LEFT HEART CATH AND CORONARY ANGIOGRAPHY; Location: [facility]; Surgeon: Margie Lovelace, MD   LEFT HEART CATH AND CORONARY ANGIOGRAPHY Left 05/25/2012   Procedure: LEFT HEART CATH AND CORONARY ANGIOGRAPHY; Location: [facility]; Surgeon: Margie Lovelace, MD   LEFT HEART CATH AND CORONARY ANGIOGRAPHY Left 10/09/2013   Procedure: LEFT HEART CATH AND CORONARY ANGIOGRAPHY; Location: [facility]; Surgeon: Vinie Jude, MD   LEFT HEART CATH AND CORS/GRAFTS ANGIOGRAPHY N/A 08/08/2018   Procedure: LEFT HEART CATH AND CORS/GRAFTS ANGIOGRAPHY;  Surgeon: Fernand Denyse LABOR, MD;  Location: [facility];  Service: Cardiovascular;  Laterality:  N/A;   LEFT HEART CATH AND CORS/GRAFTS ANGIOGRAPHY N/A 01/30/2019   Procedure: LEFT HEART CATH AND CORS/GRAFTS ANGIOGRAPHY;  Surgeon: Fernand Denyse LABOR, MD;  Location: [facility];  Service: Cardiovascular;  Laterality: N/A;   LIGATION ARTERIOVENOUS GORTEX GRAFT Right 04/20/2024   Procedure: LIGATION ARTERIOVENOUS GORTEX GRAFT;  Surgeon: Marea Selinda RAMAN, MD;  Location: [facility];  Service: Vascular;  Laterality: Right;   LOWER EXTREMITY ANGIOGRAPHY Left 04/24/2024   Procedure: Lower Extremity Angiography;  Surgeon: Marea Selinda RAMAN, MD;  Location: [facility];  Service: Cardiovascular;  Laterality: Left;   ultrasound guided pericardiocentesis     UPPER EXTREMITY ANGIOGRAPHY Right 03/30/2024   Procedure: Upper Extremity Angiography;  Surgeon: Marea Selinda RAMAN, MD;  Location: [facility];  Service: Cardiovascular;  Laterality: Right;     Social History:   reports that she has been smoking cigarettes. She has a 5 pack-year smoking history. She has never used smokeless tobacco. She reports that she does not drink  alcohol and does not use drugs.   Family History:  Her family history includes Breast cancer in her maternal aunt and maternal grandmother; Cancer in an other family member; Diabetes type II in her maternal grandmother and mother; Hypertension in her father, mother, sister, and another family member; Kidney disease in her father; Ovarian cancer in her mother; Renal Disease in an other family member.   Allergies Allergies[1]   Home Medications  Prior to Admission medications  Medication Sig Start Date End Date Taking? Authorizing Provider  albuterol  (VENTOLIN  HFA) 108 (90 Base) MCG/ACT inhaler Inhale 1-2 puffs into the lungs every 6 (six) hours as needed for wheezing or shortness of breath. 01/04/24   Orlean Alan HERO, FNP  apixaban  (ELIQUIS ) 5 MG TABS tablet Take 5 mg by mouth 2 (two) times daily. 09/11/19   [provider]  cephALEXin  (KEFLEX ) 500 MG capsule Take 1 capsule (500 mg total) by mouth 2 (two) times daily. 05/23/24   Fernand Fredy RAMAN, MD  colchicine  0.6 MG tablet Take 1 tablet (0.6 mg total) by mouth once for 1 dose. Patient not taking: Reported on 05/23/2024 04/13/24 05/19/24  Scoggins, [facility], NP  doxycycline  (VIBRA -TABS) 100 MG tablet Take 1 tablet (100 mg total) by mouth 2 (two) times daily. Patient not taking: Reported on 05/23/2024 05/09/24   Janit Thresa HERO, DPM  hydrOXYzine  (ATARAX /VISTARIL ) 50 MG tablet Take 50 mg by mouth at bedtime.    [provider]  levothyroxine  (SYNTHROID ) 75 MCG tablet Take 75 mcg by mouth daily before breakfast.  11/02/18   [provider]  lidocaine -prilocaine  (EMLA ) cream Apply 1 Application topically once. 01/22/21   [provider]  metoprolol  succinate (TOPROL -XL) 25 MG 24 hr tablet Take 1 tablet by mouth every morning. 08/27/23 08/26/24  [provider]  midodrine  (PROAMATINE ) 10 MG tablet Take 1 tablet (10 mg total) by mouth 3 (three) times daily. 02/17/24   Fernand Denyse LABOR, MD  oxyCODONE -acetaminophen   (PERCOCET) 5-325 MG tablet Take 1 tablet by mouth 2 (two) times daily as needed for severe pain (pain score 7-10). 05/23/24   Fernand Fredy RAMAN, MD  pantoprazole  (PROTONIX ) 40 MG tablet Take 40 mg by mouth every morning. 11/29/23   [provider]  ranolazine  (RANEXA ) 1000 MG SR tablet Take 1,000 mg by mouth 2 (two) times daily. 03/18/23   [provider]  rosuvastatin  (CRESTOR ) 20 MG tablet Take 20 mg by mouth at bedtime. 11/07/21   [provider]  sevelamer  carbonate (  RENVELA ) 800 MG tablet Take 3,200 mg by mouth 3 (three) times daily with meals.    [provider]  triamcinolone  cream (KENALOG ) 0.1 % Apply 1 Application topically 2 (two) times daily. 11/02/23   Orlean Alan HERO, FNP     Critical care time: 40 minutes    Aliea Bobe S. Griffin, PhD, MSN, MPH, ANP-BC [facility] Prefer epic messenger for cross cover needs If after hours, please call E-link  NB: This document was prepared using Dragon voice recognition software and may include unintentional dictation errors.            [1]  Allergies Allergen Reactions   Morphine  And Codeine Hives   Levaquin [Levofloxacin] Itching and Other (See Comments)    Severe itching; prickly sensation; severe left leg pain; immobility.   Enalapril  Other (See Comments)    hallucinations   Latex Rash   Tape Rash and Other (See Comments)   "

## 2024-06-24 NOTE — Progress Notes (Signed)
 Patient refusal to wear pulse oximeter. Education provided on need to measure oxygen  sat reading for safety of patient ongoing monitoring and health. Risk discussed including decline health including risk of death. Patient again states she refuses because it it getting on her nerves and can't take one more thing.

## 2024-06-24 NOTE — Plan of Care (Signed)
" °  Problem: Education: Goal: Knowledge of General Education information will improve Description: Including pain rating scale, medication(s)/side effects and non-pharmacologic comfort measures Outcome: Progressing   Problem: Clinical Measurements: Goal: Cardiovascular complication will be avoided Outcome: Progressing   Problem: Pain Managment: Goal: General experience of comfort will improve and/or be controlled Outcome: Progressing   Problem: Safety: Goal: Ability to remain free from injury will improve Outcome: Progressing   Problem: Skin Integrity: Goal: Risk for impaired skin integrity will decrease Outcome: Progressing   "

## 2024-06-24 NOTE — Consult Note (Signed)
 "                                                                                   Consultation Note Date: 06/24/2024 at 0942 Reason for consultation: goals of care    Patient Name: [name]  DOB: 1961-05-11  MRN: [id number]  Age / Sex: 64 y.o., female  PCP: Orlean Alan HERO, FNP Referring Physician: Isaiah Scrivener, MD  HPI/Patient Profile: 64 y.o. female  with past medical history significant for ESRD on HD (M,W,F), COPD, HFrEF (25-30%), bipolar disorder, chronic hypotension on midodrine , HLD and previous CVA. Patient presented to ED 06/22/2024 c/o syncopal episode with possible fall.  Patient reported when she arrived at her HD 1/7, she was todl her BP was too low for dialysis. She reported continuing nausea with multiple episodes of emesis with weakness. She thinks she may have passes out but is not sure what happened.  ED exam found patient to be hypotensive on arrival at 77/44.   ED labs significant for troponin 154-->155. Lactic acid 3.0-->2.0. WBC WNL 5.8, Hgb 13.6. CMP not concerning due to being dialysis patient.   ED vitals 70/41, HR 82, RR 13, SpO2 92% 3L, 97.4 F  CXR  1. Cardiomegaly with vascular congestion without overt edema.   CT Head demonstrated:  1. No acute intracranial abnormality. 2. 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  CT cervical spine showed: 1. No traumatic injury to the cervical spine. 2. Severe erosive endplate changes at C5-C6, chronic but progressive. Differential considerations include chronic osteomyelitis versus severe degenerative changes with bony remodeling. Correlate with laboratory evaluation to exclude infection.   Clinical Assessment and Goals of Care: Chart reviewed:   Labs: Troponin remains consistently increasing over admission 154-->178 today Lactic now WNL. Renal function of no concern due to dialysis status.      Latest Ref Rng & Units 06/24/2024    7:34 AM 06/23/2024    9:02 AM 06/22/2024    9:28 PM  CMP  Glucose 70 - 99 mg/dL  875   90   BUN 8 - 23 mg/dL 25   35   Creatinine 9.55 - 1.00 mg/dL 5.20   3.73   Sodium 864 - 145 mmol/L 137   137   Potassium 3.5 - 5.1 mmol/L 3.6  3.8  4.8   Chloride 98 - 111 mmol/L 95   96   CO2 22 - 32 mmol/L 25   19   Calcium  8.9 - 10.3 mg/dL 9.9   9.8   Total Protein 6.5 - 8.1 g/dL   7.4   Total Bilirubin 0.0 - 1.2 mg/dL   0.7   Alkaline Phos 38 - 126 U/L   85   AST 15 - 41 U/L   49   ALT 0 - 44 U/L   27     WBC WNL. H&H stable.    Latest Ref Rng & Units 06/24/2024    7:34 AM 06/23/2024    3:49 AM 06/22/2024    9:28 PM  CBC  WBC 4.0 - 10.5 K/uL 6.8  6.2  5.8   Hemoglobin 12.0 - 15.0 g/dL 88.0  87.4  86.3   Hematocrit 36.0 -  46.0 % 36.8  38.8  43.6   Platelets 150 - 400 K/uL 118  125  116      Vitals: Blood pressure (!) 87/64, pulse 79, temperature (!) 97.5 F (36.4 C), temperature source Oral, resp. rate 18, height 5' 6 (1.676 m), weight 75.9 kg, SpO2 97%.   Progress notes: Reviewed progress notes from ED staff/providers, TRH, TOC, PCCM, Cardiology, Nephrology, Vascular surgery and nursing staff.  Imaging: No new imaging since admission   MAR: No changes made  ACP documents: None on file  Very pleasant, ill-appearing female lying in bed. She is alert and oriented to self, time, location and situation. She is able to participate in conversation making her wishes known. Patient noted to be continuously having episode of emesis but eating in between. Respirations are even and unlabored. She is in no distress.   She complains of nausea and continued emesis today. She is eating but unable to keep anything down despite IV medication just being given. She complains on chronic L foot pain. She denies CP/SOB.   I introduced Palliative Medicine as specialized medical care for people living with serious illness. It focuses on providing relief from the symptoms and stress of a serious illness. The goal is to improve quality of life for both the patient and the family.  We  discussed a brief life review of the patient. [name] is single and lives alone. She has 2 daughters and 1 grandson. 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   As far as functional and nutritional status, she lives alone and is completely independent in her ADLs. She describes her appetite as very good.   We discussed patient's current illness and what it means in the larger context of patient's on-going co-morbidities.  Natural disease trajectory and expectations at EOL were discussed.  I attempted to elicit values and goals of care important to the patient.  [name] is adamant and states several times that she wants to go home.   The difference between aggressive medical intervention and comfort care was considered in light of the patient's goals of care.   Advance directives, concepts specific to code status, artificial feeding and hydration, and rehospitalization were considered and discussed. [name] states her wish to continue to be FULL CODE with full scope care. She states she would want all the things needed to extend her life.   Education offered regarding concept specific to human mortality and the limitations of medical interventions to prolong life when the body begins to fail to thrive.  Family is facing treatment option decisions, advanced directive, and anticipatory care needs. [name] shares her brother Fareedah Mahler is her surrogate decision maker in the event she is not able to make decisions for herself. Her sister, Iantha Titsworth, is her second runner, broadcasting/film/video.  Will discuss HPOA documentation once patient can focus on conversation after emesis is more controlled.    Discussed with patient/family the importance of continued conversation with family and the medical providers regarding overall plan of care and treatment options, ensuring decisions are within the context of the patients values and GOCs.    Questions and concerns were  addressed. The family was encouraged to call with questions or concerns.   Primary Decision Maker PATIENT  Physical Exam Vitals reviewed.  Constitutional:      Appearance: She is ill-appearing.  HENT:     Head: Normocephalic and atraumatic.     Mouth/Throat:     Mouth:  Mucous membranes are moist.  Pulmonary:     Effort: Pulmonary effort is normal. No respiratory distress.  Abdominal:     Comments: Nausea/emesis  Musculoskeletal:     Right lower leg: No edema.     Left lower leg: No edema.  Skin:    General: Skin is warm and dry.  Neurological:     Mental Status: She is alert and oriented to person, place, and time.  Psychiatric:        Mood and Affect: Mood normal.        Behavior: Behavior normal.        Thought Content: Thought content normal.        Judgment: Judgment normal.   Recommendations/Plan: FULL CODE status as previously documented    Continue current supportive interventions Following TOC for disposition   Palliative Assessment/Data: 70-80%   Discussed plan of care with primary RN  Thank you for this consult. Palliative medicine will continue to follow and assist holistically.   I personally spent a total of 90 minutes in the care of the patient today including preparing to see the patient, getting/reviewing separately obtained history, performing a medically appropriate exam/evaluation, counseling and educating, referring and communicating with other health care professionals, documenting clinical information in the EHR, independently interpreting results, and coordinating care.     Devere Sacks, AMANDA [facility] Palliative Medicine Team  06/24/2024 9:42 AM  Office [phone]  Pager [phone]     Please contact Palliative Medicine Team providers via AMION for questions and concerns.   "

## 2024-06-24 NOTE — Progress Notes (Signed)
 [facility] Cardiology    SUBJECTIVE: Patient still has significant leg pain wounds of bandage still dark discoloration pain tenderness no swelling no shortness of breath blood pressure improved no chest pain resting comfortably in bed does mainly complain of leg pain   Vitals:   06/24/24 1200 06/24/24 1300 06/24/24 1332 06/24/24 1400  BP: 100/65 116/81 (!) 78/53 (!) 81/58  Pulse: 75 79    Resp: 18 13 15 13   Temp: 97.7 F (36.5 C)     TempSrc: Axillary     SpO2: 92%     Weight:      Height:         Intake/Output Summary (Last 24 hours) at 06/24/2024 1446 Last data filed at 06/24/2024 1330 Gross per 24 hour  Intake 1285.77 ml  Output 2300 ml  Net -1014.23 ml      PHYSICAL EXAM  General: Well developed, well nourished, in no acute distress HEENT:  Normocephalic and atramatic Neck:  No JVD.  Lungs: Clear bilaterally to auscultation and percussion. Heart: HRRR . Normal S1 and S2 without gallops or murmurs.  Abdomen: Bowel sounds are positive, abdomen soft and non-tender  Msk:  Back normal, normal gait. Normal strength and tone for age. Extremities: No clubbing, cyanosis or edema.   Neuro: Alert and oriented X 3. Psych:  Good affect, responds appropriately   LABS: Basic Metabolic Panel: Recent Labs    06/22/24 2128 06/23/24 0349 06/23/24 0902 06/24/24 0734  NA 137  --   --  137  K 4.8  --  3.8 3.6  CL 96*  --   --  95*  CO2 19*  --   --  25  GLUCOSE 90  --   --  124*  BUN 35*  --   --  25*  CREATININE 6.26*  --   --  4.79*  CALCIUM  9.8  --   --  9.9  MG  --  2.0 2.0  --   PHOS  --  6.4*  --  4.6   Liver Function Tests: Recent Labs    06/22/24 2128 06/24/24 0734  AST 49*  --   ALT 27  --   ALKPHOS 85  --   BILITOT 0.7  --   PROT 7.4  --   ALBUMIN  4.0 4.1   No results for input(s): LIPASE, AMYLASE in the last 72 hours. CBC: Recent Labs    06/23/24 0349 06/24/24 0734  WBC 6.2 6.8  HGB 12.5 11.9*  HCT 38.8 36.8  MCV 100.8* 98.4  PLT 125* 118*    Cardiac Enzymes: No results for input(s): CKTOTAL, CKMB, CKMBINDEX, TROPONINI in the last 72 hours. BNP: Invalid input(s): POCBNP D-Dimer: No results for input(s): DDIMER in the last 72 hours. Hemoglobin A1C: No results for input(s): HGBA1C in the last 72 hours. Fasting Lipid Panel: No results for input(s): CHOL, HDL, LDLCALC, TRIG, CHOLHDL, LDLDIRECT in the last 72 hours. Thyroid  Function Tests: Recent Labs    06/23/24 0902  TSH 2.450  T4TOTAL 7.7   Anemia Panel: No results for input(s): VITAMINB12, FOLATE, FERRITIN, TIBC, IRON, RETICCTPCT in the last 72 hours.  ECHOCARDIOGRAM COMPLETE Result Date: 06/23/2024    ECHOCARDIOGRAM REPORT   Patient Name:   [name] Date of Exam: 06/23/2024 Medical Rec #:  [id number]         Height:       66.0 in Accession #:    [id number]        Weight:  164.5 lb Date of Birth:  [birth year]          BSA:          1.840 m Patient Age:    64 years          BP:           101/73 mmHg Patient Gender: F                 HR:           107 bpm. Exam Location:  [facility] Procedure: 2D Echo, Color Doppler and Cardiac Doppler (Both Spectral and Color            Flow Doppler were utilized during procedure). STAT ECHO Indications:     Syncope R55  History:         Patient has prior history of Echocardiogram examinations, most                  recent 05/17/2023. COPD; Arrythmias:Atrial Fibrillation and                  Atrial Flutter.  Sonographer:     Christopher Furnace Referring Phys:  [id number] ROBET KIM Diagnosing Phys: Catha Ontko D Kenyetta Wimbish MD IMPRESSIONS  1. Left ventricular ejection fraction, by estimation, is 25 to 30%. The left ventricle has severely decreased function. The left ventricle demonstrates global hypokinesis. The left ventricular internal cavity size was severely dilated. Left ventricular diastolic function could not be evaluated.  2. Right ventricular systolic function is severely reduced. The right ventricular size is  severely enlarged. Mildly increased right ventricular wall thickness.  3. Left atrial size was mildly dilated.  4. Right atrial size was severely dilated.  5. The mitral valve is normal in structure. Mild mitral valve regurgitation.  6. Tricuspid valve regurgitation is severe.  7. The aortic valve is grossly normal. Aortic valve regurgitation is not visualized. Aortic valve sclerosis is present, with no evidence of aortic valve stenosis. FINDINGS  Left Ventricle: Left ventricular ejection fraction, by estimation, is 25 to 30%. The left ventricle has severely decreased function. The left ventricle demonstrates global hypokinesis. Strain was performed and the global longitudinal strain is indeterminate. The left ventricular internal cavity size was severely dilated. There is borderline left ventricular hypertrophy. Left ventricular diastolic function could not be evaluated. Right Ventricle: The right ventricular size is severely enlarged. Mildly increased right ventricular wall thickness. Right ventricular systolic function is severely reduced. Left Atrium: Left atrial size was mildly dilated. Right Atrium: Right atrial size was severely dilated. Pericardium: There is no evidence of pericardial effusion. Mitral Valve: The mitral valve is normal in structure. Mild mitral valve regurgitation. Tricuspid Valve: The tricuspid valve is grossly normal. Tricuspid valve regurgitation is severe. Aortic Valve: The aortic valve is grossly normal. Aortic valve regurgitation is not visualized. Aortic valve sclerosis is present, with no evidence of aortic valve stenosis. Aortic valve mean gradient measures 3.0 mmHg. Aortic valve peak gradient measures 4.4 mmHg. Aortic valve area, by VTI measures 2.08 cm. Pulmonic Valve: The pulmonic valve was not well visualized. Pulmonic valve regurgitation is mild. Aorta: The aortic root was not well visualized. IAS/Shunts: No atrial level shunt detected by color flow Doppler. Additional  Comments: 3D was performed not requiring image post processing on an independent workstation and was indeterminate.  LEFT VENTRICLE PLAX 2D LVIDd:         6.02 cm      Diastology LVIDs:  4.64 cm      LV e' medial:  4.64 cm/s LV PW:         1.11 cm      LV e' lateral: 8.32 cm/s LV IVS:        1.31 cm LVOT diam:     2.00 cm LV SV:         28 LV SV Index:   15 LVOT Area:     3.14 cm  LV Volumes (MOD) LV vol d, MOD A2C: 172.0 ml LV vol d, MOD A4C: 173.0 ml LV vol s, MOD A2C: 168.0 ml LV vol s, MOD A4C: 119.0 ml LV SV MOD A2C:     4.0 ml LV SV MOD A4C:     173.0 ml LV SV MOD BP:      33.8 ml RIGHT VENTRICLE RV Basal diam:  4.52 cm RV Mid diam:    3.20 cm LEFT ATRIUM             Index        RIGHT ATRIUM           Index LA diam:        3.40 cm 1.85 cm/m   RA Area:     28.70 cm LA Vol (A2C):   86.5 ml 47.00 ml/m  RA Volume:   110.00 ml 59.77 ml/m LA Vol (A4C):   67.8 ml 36.84 ml/m LA Biplane Vol: 78.1 ml 42.44 ml/m  AORTIC VALVE AV Area (Vmax):    1.81 cm AV Area (Vmean):   1.64 cm AV Area (VTI):     2.08 cm AV Vmax:           105.00 cm/s AV Vmean:          79.900 cm/s AV VTI:            0.133 m AV Peak Grad:      4.4 mmHg AV Mean Grad:      3.0 mmHg LVOT Vmax:         60.60 cm/s LVOT Vmean:        41.700 cm/s LVOT VTI:          0.088 m LVOT/AV VTI ratio: 0.66  AORTA Ao Root diam: 2.30 cm TRICUSPID VALVE TR Peak grad:   45.7 mmHg TR Vmax:        338.00 cm/s  SHUNTS Systemic VTI:  0.09 m Systemic Diam: 2.00 cm [name] MD Electronically signed by [name] MD Signature Date/Time: 06/23/2024/2:44:50 PM    Final    CT Cervical Spine Wo Contrast Result Date: 06/22/2024 EXAM: CT CERVICAL SPINE WITHOUT CONTRAST 06/22/2024 09:55:46 PM TECHNIQUE: CT of the cervical spine was performed without the administration of intravenous contrast. Multiplanar reformatted images are provided for review. Automated exposure control, iterative reconstruction, and/or weight based adjustment of the mA/kV was  utilized to reduce the radiation dose to as low as reasonably achievable. COMPARISON: None available. CLINICAL HISTORY: Polytrauma, blunt. FINDINGS: BONES AND ALIGNMENT: Reversal of the normal mid cervical lordosis, chronic. No acute fracture or traumatic malalignment. DEGENERATIVE CHANGES: Severe erosive endplate changes at C5-C6, chronic but progressive when compared to remote prior. Differential considerations include chronic osteomyelitis versus severe degenerative changes with bony remodeling. SOFT TISSUES: No prevertebral soft tissue swelling. IMPRESSION: 1. No traumatic injury to the cervical spine. 2. Severe erosive endplate changes at C5-C6, chronic but progressive. Differential considerations include chronic osteomyelitis versus severe degenerative changes with bony remodeling. Correlate with laboratory evaluation to exclude  infection. Electronically signed by: Pinkie Pebbles MD MD 06/22/2024 10:07 PM EST RP Workstation: [id number]   CT HEAD WO CONTRAST ( ) Result Date: 06/22/2024 EXAM: CT HEAD WITHOUT CONTRAST 06/22/2024 09:55:46 PM TECHNIQUE: CT of the head was performed without the administration of intravenous contrast. Automated exposure control, iterative reconstruction, and/or weight based adjustment of the mA/kV was utilized to reduce the radiation dose to as low as reasonably achievable. COMPARISON: MRI brain dated 08/16/2023. CLINICAL HISTORY: Polytrauma, blunt. FINDINGS: BRAIN AND VENTRICLES: No acute hemorrhage. No evidence of acute infarct. Remote infarcts in bilateral frontal lobes and right parietal lobe. Moderate white matter disease. Intracranial atherosclerosis. No hydrocephalus. No extra-axial collection. No mass effect or midline shift. ORBITS: No acute abnormality. SINUSES: No acute abnormality. SOFT TISSUES AND SKULL: No acute soft tissue abnormality. No skull fracture. IMPRESSION: 1. No acute intracranial abnormality. 2. 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 Electronically signed  by: Pinkie Pebbles MD MD 06/22/2024 10:03 PM EST RP Workstation: [id number]   DG Chest Portable 1 View Result Date: 06/22/2024 EXAM: 1 VIEW XRAY OF THE CHEST 06/22/2024 09:30:41 PM COMPARISON: 02/28/2024. Prior CAG. CLINICAL HISTORY: weakness FINDINGS: LINES, TUBES AND DEVICES: Right dialysis catheter tip in the right atrium. LUNGS AND PLEURA: Vascular congestion. No overt edema. No focal pulmonary opacity. No pleural effusion. No pneumothorax. HEART AND MEDIASTINUM: Cardiomegaly. BONES AND SOFT TISSUES: No acute osseous abnormality. IMPRESSION: 1. Cardiomegaly with vascular congestion without overt edema. Electronically signed by: Franky Crease MD 06/22/2024 09:35 PM EST RP Workstation: [id number]     Echo severely depressed left ventricular function EF around 25 to 30% globally  TELEMETRY: Atrial fibrillation rate of 75 nonspecific ST-T wave changes:  ASSESSMENT AND PLAN:  Principal Problem:   Syncope Ischemic cardiomyopathy HFrEF COPD Severe peripheral vascular disease with rest pain Obesity End-stage renal disease on dialysis Hypotension Acute on chronic hypoxic respiratory failure Atrial fibrillation  Plan Continue anticoagulation for ischemic leg and atrial fibrillation with heparin  transition to Eliquis  Continue respiratory support for COPD Hypotension improved no longer on pressor therapy Agree with dialysis therapy for end-stage renal disease on dialysis Critical limb ischemia limited interventional options as per vascular continue conservative therapy anticoagulation Etiology of syncope unclear multifactorial continue conservative therapy Continue heart failure therapy for depressed left ventricular function and HFrEF No invasive cardiology procedures recommended at this point   [name], MD, 06/24/2024 2:46 PM

## 2024-06-24 NOTE — Progress Notes (Signed)
 " [facility]  ROUNDING NOTE   Subjective:   [name] is a 64 year old African-American female with past medical histories including CAD with CABG and end-stage renal disease on dialysis.  Patient presents to the emergency department after suffering a syncopal episode.  Patient has been admitted for Syncope [R55] NSTEMI (non-ST elevated myocardial infarction) (HCC) [I21.4] Hypotension, unspecified hypotension type [I95.9]   Patient is known to our clinic and receives outpatient dialysis treatments at [facility] on MWF schedule, supervised by Dr. Dennise.  Patient states she does not remember episode.  States she just awoke on the floor.  Unaware if she hit her head.  Reports she has been missing dialysis treatments due to increased lower extremity pain.  She reports 1 missed treatment every 1 to 2 weeks.  Labs on ED arrival significant for serum bicarb 19, creatinine 6.26 with GFR 7, BUN 35, troponin 154, respiratory panel negative for influenza, COVID-19, and RSV.  MRSA PCR positive.  Chest x-ray shows vascular congestion without edema.  CT head negative.  CT spine shows age-related changes.  Patient placed in ICU receiving pressor support with Levophed  and amiodarone  drip.  Update:  Hemodialysis treatment yesterday. Tolerated treatment yesterday. 1.5 liters removed.   Remains on norepinephrine  and amiodarone  gtt.   Objective:  Vital signs in last 24 hours:  Temp:  [97.4 F (36.3 C)-97.9 F (36.6 C)] 97.7 F (36.5 C) (01/10 1200) Pulse Rate:  [30-107] 79 (01/10 1300) Resp:  [10-26] 13 (01/10 1400) BP: (70-137)/(29-121) 81/58 (01/10 1400) SpO2:  [57 %-100 %] 92 % (01/10 1200) Weight:  [75.9 kg-76.3 kg] 75.9 kg (01/10 0500)  Weight change: 4.6 kg Filed Weights   06/23/24 1333 06/23/24 1743 06/24/24 0500  Weight: 77.4 kg 76.3 kg 75.9 kg    Intake/Output: I/O last 3 completed shifts: In: 1636.9 [I.V.:1136.9; IV Piggyback:500] Out: 1500  [Other:1500]   Intake/Output this shift:  Total I/O In: 381.2 [P.O.:240; I.V.:141.2] Out: 800 [Emesis/NG output:800]  Physical Exam: General: NAD, sitting up in bed  Head: Normocephalic, atraumatic. Moist oral mucosal membranes  Eyes: Anicteric  Lungs:  Clear to auscultation  Heart: irregular  Abdomen:  Soft, nontender  Extremities: No peripheral edema.  Neurologic: Awake, alert,   Skin: Warm,dry, no rash  Access: Right chest PermCath, left AVF +thrill +bruit    Basic Metabolic Panel: Recent Labs  Lab 06/22/24 2128 06/23/24 0349 06/23/24 0902 06/24/24 0734  NA 137  --   --  137  K 4.8  --  3.8 3.6  CL 96*  --   --  95*  CO2 19*  --   --  25  GLUCOSE 90  --   --  124*  BUN 35*  --   --  25*  CREATININE 6.26*  --   --  4.79*  CALCIUM  9.8  --   --  9.9  MG  --  2.0 2.0  --   PHOS  --  6.4*  --  4.6    Liver Function Tests: Recent Labs  Lab 06/22/24 2128 06/24/24 0734  AST 49*  --   ALT 27  --   ALKPHOS 85  --   BILITOT 0.7  --   PROT 7.4  --   ALBUMIN  4.0 4.1   No results for input(s): LIPASE, AMYLASE in the last 168 hours. No results for input(s): AMMONIA in the last 168 hours.  CBC: Recent Labs  Lab 06/22/24 2128 06/23/24 0349 06/24/24 0734  WBC 5.8 6.2  6.8  HGB 13.6 12.5 11.9*  HCT 43.6 38.8 36.8  MCV 102.6* 100.8* 98.4  PLT 116* 125* 118*    Cardiac Enzymes: No results for input(s): CKTOTAL, CKMB, CKMBINDEX, TROPONINI in the last 168 hours.  BNP: Invalid input(s): POCBNP  CBG: Recent Labs  Lab 06/23/24 1902 06/23/24 2319 06/24/24 0326 06/24/24 0729 06/24/24 1124  GLUCAP 164* 155* 135* 138* 130*    Microbiology: Results for orders placed or performed during the hospital encounter of 06/22/24  Resp panel by RT-PCR (RSV, Flu A&B, Covid) Anterior Nasal Swab     Status: None   Collection Time: 06/22/24  9:28 PM   Specimen: Anterior Nasal Swab  Result Value Ref Range Status   SARS Coronavirus 2 by RT PCR NEGATIVE  NEGATIVE Final    Comment: (NOTE) SARS-CoV-2 target nucleic acids are NOT DETECTED.  The SARS-CoV-2 RNA is generally detectable in upper respiratory specimens during the acute phase of infection. The lowest concentration of SARS-CoV-2 viral copies this assay can detect is 138 copies/mL. A negative result does not preclude SARS-Cov-2 infection and should not be used as the sole basis for treatment or other patient management decisions. A negative result may occur with  improper specimen collection/handling, submission of specimen other than nasopharyngeal swab, presence of viral mutation(s) within the areas targeted by this assay, and inadequate number of viral copies(<138 copies/mL). A negative result must be combined with clinical observations, patient history, and epidemiological information. The expected result is Negative.  Fact Sheet for Patients:  bloggercourse.com  Fact Sheet for Healthcare Providers:  seriousbroker.it  This test is no t yet approved or cleared by the [place]  FDA and  has been authorized for detection and/or diagnosis of SARS-CoV-2 by FDA under an Emergency Use Authorization (EUA). This EUA will remain  in effect (meaning this test can be used) for the duration of the COVID-19 declaration under Section 564(b)(1) of the Act, 21 U.S.C.section 360bbb-3(b)(1), unless the authorization is terminated  or revoked sooner.       Influenza A by PCR NEGATIVE NEGATIVE Final   Influenza B by PCR NEGATIVE NEGATIVE Final    Comment: (NOTE) The Xpert Xpress SARS-CoV-2/FLU/RSV plus assay is intended as an aid in the diagnosis of influenza from Nasopharyngeal swab specimens and should not be used as a sole basis for treatment. Nasal washings and aspirates are unacceptable for Xpert Xpress SARS-CoV-2/FLU/RSV testing.  Fact Sheet for Patients: bloggercourse.com  Fact Sheet for Healthcare  Providers: seriousbroker.it  This test is not yet approved or cleared by the [place]  FDA and has been authorized for detection and/or diagnosis of SARS-CoV-2 by FDA under an Emergency Use Authorization (EUA). This EUA will remain in effect (meaning this test can be used) for the duration of the COVID-19 declaration under Section 564(b)(1) of the Act, 21 U.S.C. section 360bbb-3(b)(1), unless the authorization is terminated or revoked.     Resp Syncytial Virus by PCR NEGATIVE NEGATIVE Final    Comment: (NOTE) Fact Sheet for Patients: bloggercourse.com  Fact Sheet for Healthcare Providers: seriousbroker.it  This test is not yet approved or cleared by the [place]  FDA and has been authorized for detection and/or diagnosis of SARS-CoV-2 by FDA under an Emergency Use Authorization (EUA). This EUA will remain in effect (meaning this test can be used) for the duration of the COVID-19 declaration under Section 564(b)(1) of the Act, 21 U.S.C. section 360bbb-3(b)(1), unless the authorization is terminated or revoked.  Performed at [facility], [address] [place],  [place] [address]   MRSA Next Gen by PCR, Nasal     Status: Abnormal   Collection Time: 06/22/24 11:51 PM   Specimen: Nasal Mucosa; Nasal Swab  Result Value Ref Range Status   MRSA by PCR Next Gen DETECTED (A) NOT DETECTED Final    Comment: RESULT CALLED TO, READ BACK BY AND VERIFIED WITH: ABIGAIL FOWLKE @0215  ON 06/23/24 SKL (NOTE) The GeneXpert MRSA Assay (FDA approved for NASAL specimens only), is one component of a comprehensive MRSA colonization surveillance program. It is not intended to diagnose MRSA infection nor to guide or monitor treatment for MRSA infections. Test performance is not FDA approved in patients less than 23 years old. Performed at [facility], [address] [place], [place] [address]      Coagulation Studies: No results for input(s): LABPROT, INR in the last 72 hours.  Urinalysis: No results for input(s): COLORURINE, LABSPEC, PHURINE, GLUCOSEU, HGBUR, BILIRUBINUR, KETONESUR, PROTEINUR, UROBILINOGEN, NITRITE, LEUKOCYTESUR in the last 72 hours.  Invalid input(s): APPERANCEUR    Imaging: ECHOCARDIOGRAM COMPLETE Result Date: 06/23/2024    ECHOCARDIOGRAM REPORT   Patient Name:   LOGEN FOWLE [facility] Date of Exam: 06/23/2024 Medical Rec #:  [id number]         Height:       66.0 in Accession #:    [id number]        Weight:       164.5 lb Date of Birth:  07-11-1960          BSA:          1.840 m Patient Age:    63 years          BP:           101/73 mmHg Patient Gender: F                 HR:           107 bpm. Exam Location:  [facility] Procedure: 2D Echo, Color Doppler and Cardiac Doppler (Both Spectral and Color            Flow Doppler were utilized during procedure). STAT ECHO Indications:     Syncope R55  History:         Patient has prior history of Echocardiogram examinations, most                  recent 05/17/2023. COPD; Arrythmias:Atrial Fibrillation and                  Atrial Flutter.  Sonographer:     Christopher Furnace Referring Phys:  [id number] ROBET KIM Diagnosing Phys: Dwayne D Callwood MD IMPRESSIONS  1. Left ventricular ejection fraction, by estimation, is 25 to 30%. The left ventricle has severely decreased function. The left ventricle demonstrates global hypokinesis. The left ventricular internal cavity size was severely dilated. Left ventricular diastolic function could not be evaluated.  2. Right ventricular systolic function is severely reduced. The right ventricular size is severely enlarged. Mildly increased right ventricular wall thickness.  3. Left atrial size was mildly dilated.  4. Right atrial size was severely dilated.  5. The mitral valve is normal in structure. Mild mitral valve regurgitation.  6. Tricuspid valve regurgitation is severe.  7. The  aortic valve is grossly normal. Aortic valve regurgitation is not visualized. Aortic valve sclerosis is present, with no evidence of aortic valve stenosis. FINDINGS  Left Ventricle: Left ventricular ejection fraction, by estimation, is 25 to 30%. The left ventricle has  severely decreased function. The left ventricle demonstrates global hypokinesis. Strain was performed and the global longitudinal strain is indeterminate. The left ventricular internal cavity size was severely dilated. There is borderline left ventricular hypertrophy. Left ventricular diastolic function could not be evaluated. Right Ventricle: The right ventricular size is severely enlarged. Mildly increased right ventricular wall thickness. Right ventricular systolic function is severely reduced. Left Atrium: Left atrial size was mildly dilated. Right Atrium: Right atrial size was severely dilated. Pericardium: There is no evidence of pericardial effusion. Mitral Valve: The mitral valve is normal in structure. Mild mitral valve regurgitation. Tricuspid Valve: The tricuspid valve is grossly normal. Tricuspid valve regurgitation is severe. Aortic Valve: The aortic valve is grossly normal. Aortic valve regurgitation is not visualized. Aortic valve sclerosis is present, with no evidence of aortic valve stenosis. Aortic valve mean gradient measures 3.0 mmHg. Aortic valve peak gradient measures 4.4 mmHg. Aortic valve area, by VTI measures 2.08 cm. Pulmonic Valve: The pulmonic valve was not well visualized. Pulmonic valve regurgitation is mild. Aorta: The aortic root was not well visualized. IAS/Shunts: No atrial level shunt detected by color flow Doppler. Additional Comments: 3D was performed not requiring image post processing on an independent workstation and was indeterminate.  LEFT VENTRICLE PLAX 2D LVIDd:         6.02 cm      Diastology LVIDs:         4.64 cm      LV e' medial:  4.64 cm/s LV PW:         1.11 cm      LV e' lateral: 8.32 cm/s LV IVS:         1.31 cm LVOT diam:     2.00 cm LV SV:         28 LV SV Index:   15 LVOT Area:     3.14 cm  LV Volumes (MOD) LV vol d, MOD A2C: 172.0 ml LV vol d, MOD A4C: 173.0 ml LV vol s, MOD A2C: 168.0 ml LV vol s, MOD A4C: 119.0 ml LV SV MOD A2C:     4.0 ml LV SV MOD A4C:     173.0 ml LV SV MOD BP:      33.8 ml RIGHT VENTRICLE RV Basal diam:  4.52 cm RV Mid diam:    3.20 cm LEFT ATRIUM             Index        RIGHT ATRIUM           Index LA diam:        3.40 cm 1.85 cm/m   RA Area:     28.70 cm LA Vol (A2C):   86.5 ml 47.00 ml/m  RA Volume:   110.00 ml 59.77 ml/m LA Vol (A4C):   67.8 ml 36.84 ml/m LA Biplane Vol: 78.1 ml 42.44 ml/m  AORTIC VALVE AV Area (Vmax):    1.81 cm AV Area (Vmean):   1.64 cm AV Area (VTI):     2.08 cm AV Vmax:           105.00 cm/s AV Vmean:          79.900 cm/s AV VTI:            0.133 m AV Peak Grad:      4.4 mmHg AV Mean Grad:      3.0 mmHg LVOT Vmax:         60.60 cm/s LVOT Vmean:  41.700 cm/s LVOT VTI:          0.088 m LVOT/AV VTI ratio: 0.66  AORTA Ao Root diam: 2.30 cm TRICUSPID VALVE TR Peak grad:   45.7 mmHg TR Vmax:        338.00 cm/s  SHUNTS Systemic VTI:  0.09 m Systemic Diam: 2.00 cm [name] MD Electronically signed by [name] MD Signature Date/Time: 06/23/2024/2:44:50 PM    Final    CT Cervical Spine Wo Contrast Result Date: 06/22/2024 EXAM: CT CERVICAL SPINE WITHOUT CONTRAST 06/22/2024 09:55:46 PM TECHNIQUE: CT of the cervical spine was performed without the administration of intravenous contrast. Multiplanar reformatted images are provided for review. Automated exposure control, iterative reconstruction, and/or weight based adjustment of the mA/kV was utilized to reduce the radiation dose to as low as reasonably achievable. COMPARISON: None available. CLINICAL HISTORY: Polytrauma, blunt. FINDINGS: BONES AND ALIGNMENT: Reversal of the normal mid cervical lordosis, chronic. No acute fracture or traumatic malalignment. DEGENERATIVE CHANGES: Severe  erosive endplate changes at C5-C6, chronic but progressive when compared to remote prior. Differential considerations include chronic osteomyelitis versus severe degenerative changes with bony remodeling. SOFT TISSUES: No prevertebral soft tissue swelling. IMPRESSION: 1. No traumatic injury to the cervical spine. 2. Severe erosive endplate changes at C5-C6, chronic but progressive. Differential considerations include chronic osteomyelitis versus severe degenerative changes with bony remodeling. Correlate with laboratory evaluation to exclude infection. Electronically signed by: Pinkie Pebbles MD MD 06/22/2024 10:07 PM EST RP Workstation: [id number]   CT HEAD WO CONTRAST ( ) Result Date: 06/22/2024 EXAM: CT HEAD WITHOUT CONTRAST 06/22/2024 09:55:46 PM TECHNIQUE: CT of the head was performed without the administration of intravenous contrast. Automated exposure control, iterative reconstruction, and/or weight based adjustment of the mA/kV was utilized to reduce the radiation dose to as low as reasonably achievable. COMPARISON: MRI brain dated 08/16/2023. CLINICAL HISTORY: Polytrauma, blunt. FINDINGS: BRAIN AND VENTRICLES: No acute hemorrhage. No evidence of acute infarct. Remote infarcts in bilateral frontal lobes and right parietal lobe. Moderate white matter disease. Intracranial atherosclerosis. No hydrocephalus. No extra-axial collection. No mass effect or midline shift. ORBITS: No acute abnormality. SINUSES: No acute abnormality. SOFT TISSUES AND SKULL: No acute soft tissue abnormality. No skull fracture. IMPRESSION: 1. No acute intracranial abnormality. 2. 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 Electronically signed by: Pinkie Pebbles MD MD 06/22/2024 10:03 PM EST RP Workstation: [id number]   DG Chest Portable 1 View Result Date: 06/22/2024 EXAM: 1 VIEW XRAY OF THE CHEST 06/22/2024 09:30:41 PM COMPARISON: 02/28/2024. Prior CAG. CLINICAL HISTORY: weakness FINDINGS: LINES, TUBES AND DEVICES: Right dialysis  catheter tip in the right atrium. LUNGS AND PLEURA: Vascular congestion. No overt edema. No focal pulmonary opacity. No pleural effusion. No pneumothorax. HEART AND MEDIASTINUM: Cardiomegaly. BONES AND SOFT TISSUES: No acute osseous abnormality. IMPRESSION: 1. Cardiomegaly with vascular congestion without overt edema. Electronically signed by: Kevin Dover MD 06/22/2024 09:35 PM EST RP Workstation: [id number]     Medications:    norepinephrine  (LEVOPHED ) Adult infusion Stopped (06/24/24 1200)    apixaban   5 mg Oral BID   Chlorhexidine  Gluconate Cloth  6 each Topical Q0600   feeding supplement (NEPRO CARB STEADY)  237 mL Oral TID BM   levothyroxine   75 mcg Oral Q0600   midodrine   10 mg Oral TID WC   multivitamin  1 tablet Oral QHS   oxyCODONE -acetaminophen   1 tablet Oral Once   oxyCODONE -acetaminophen   1 tablet Oral BID   rosuvastatin   20 mg Oral QHS   heparin , HYDROmorphone  (DILAUDID ) injection, ipratropium-albuterol ,  lidocaine -prilocaine , ondansetron  (ZOFRAN ) IV, pentafluoroprop-tetrafluoroeth, polyethylene glycol, senna  Assessment/ Plan:  [name] is a 64 y.o.  female with past medical histories including CAD with CABG and end-stage renal disease on dialysis.  Patient presents to the emergency department after suffering a syncopal episode.  Patient has been admitted for Syncope [R55] NSTEMI (non-ST elevated myocardial infarction) (HCC) [I21.4] Hypotension, unspecified hypotension type [I95.9]  [facility]/MWF/right PermCath/72.5 kg  End-stage renal disease on hemodialysis.  Last treatment received on Friday. Continue MWF schedule. Next treatment for Monday.   2.  Hypotension with atrial fibrillation: Echo with EF 20-25%. with Syncopal episode with collapse.   - norepinephrine  - amiodarone  - continue midorine   3. Anemia of chronic kidney disease Lab Results  Component Value Date   HGB 11.9 (L) 06/24/2024    Hemoglobin stable.  No need for  ESA's.  4. Secondary Hyperparathyroidism: with outpatient labs: PTH 1577, phosphorus 8.7, calcium  9.3 on 05/22/24.   Lab Results  Component Value Date   PTH 627 (H) 07/26/2020   CALCIUM  9.9 06/24/2024   CAION 0.99 (L) 04/20/2024   PHOS 4.6 06/24/2024    Will continue to monitor bone minerals during this admission.  Phosphorus elevated.  Patient prescribed calcitriol and sevelamer  outpatient.Prescribed Parsabiv outpatient.    LOS: 2 Pierre Cumpton 1/10/20262:21 PM  . "

## 2024-06-24 NOTE — Progress Notes (Signed)
 Verbal order received from Dr Douglas to give a one time additional dose of zofran  4mg  IV for patients vomiting.

## 2024-06-24 NOTE — Consult Note (Addendum)
 WOC Nurse Consult Note: patient has been evaluated by vascular surgeon Reason for Consult: L posterior heel wound  Wound type: full thickness L posterior heel/achilles area r/t PVD  Pressure Injury POA: NA not pressure  Measurement: see nursing flowsheet  Wound bed:dry red and yellow  Drainage (amount, consistency, odor) scant per bedside nurse  Periwound: peeling dark skin  Dressing procedure/placement/frequency: Cleanse L posterior heel wound with Vashe, do not rinse. Apply a small piece of silver hydrofiber Soila ([phone] Aquacel AG) to wound bed daily, cover over entire area with Xeroform gauze Soila [phone]) and secure with silicone foam or Kerlix roll gauze whichever is preferred.   POC discussed with bedside nurse. WOC team will not follow. Patient should continue to follow with vascular surgeon/podiatry for ongoing management.   ADDENDUM 12:24:  Per bedside nurse no Aquacel AG available, will change order to Vashe moistened gauze to wound bed daily   Thank you,    Dagon Budai MSN, RN-BC, [employer]

## 2024-06-25 ENCOUNTER — Inpatient Hospital Stay

## 2024-06-25 DIAGNOSIS — Z789 Other specified health status: Secondary | ICD-10-CM | POA: Diagnosis not present

## 2024-06-25 DIAGNOSIS — Z515 Encounter for palliative care: Secondary | ICD-10-CM | POA: Diagnosis not present

## 2024-06-25 DIAGNOSIS — R55 Syncope and collapse: Secondary | ICD-10-CM | POA: Diagnosis not present

## 2024-06-25 DIAGNOSIS — I959 Hypotension, unspecified: Secondary | ICD-10-CM | POA: Diagnosis not present

## 2024-06-25 DIAGNOSIS — I214 Non-ST elevation (NSTEMI) myocardial infarction: Secondary | ICD-10-CM | POA: Diagnosis not present

## 2024-06-25 DIAGNOSIS — E8770 Fluid overload, unspecified: Secondary | ICD-10-CM | POA: Diagnosis not present

## 2024-06-25 LAB — GLUCOSE, CAPILLARY
Glucose-Capillary: 116 mg/dL — ABNORMAL HIGH (ref 70–99)
Glucose-Capillary: 118 mg/dL — ABNORMAL HIGH (ref 70–99)
Glucose-Capillary: 83 mg/dL (ref 70–99)
Glucose-Capillary: 86 mg/dL (ref 70–99)
Glucose-Capillary: 96 mg/dL (ref 70–99)

## 2024-06-25 LAB — RENAL FUNCTION PANEL
Albumin: 3.9 g/dL (ref 3.5–5.0)
Anion gap: 17 — ABNORMAL HIGH (ref 5–15)
BUN: 31 mg/dL — ABNORMAL HIGH (ref 8–23)
CO2: 25 mmol/L (ref 22–32)
Calcium: 9.8 mg/dL (ref 8.9–10.3)
Chloride: 97 mmol/L — ABNORMAL LOW (ref 98–111)
Creatinine, Ser: 5.94 mg/dL — ABNORMAL HIGH (ref 0.44–1.00)
GFR, Estimated: 8 mL/min — ABNORMAL LOW
Glucose, Bld: 88 mg/dL (ref 70–99)
Phosphorus: 5.7 mg/dL — ABNORMAL HIGH (ref 2.5–4.6)
Potassium: 4.1 mmol/L (ref 3.5–5.1)
Sodium: 138 mmol/L (ref 135–145)

## 2024-06-25 LAB — CBC
HCT: 36.7 % (ref 36.0–46.0)
Hemoglobin: 11.7 g/dL — ABNORMAL LOW (ref 12.0–15.0)
MCH: 31.9 pg (ref 26.0–34.0)
MCHC: 31.9 g/dL (ref 30.0–36.0)
MCV: 100 fL (ref 80.0–100.0)
Platelets: 106 K/uL — ABNORMAL LOW (ref 150–400)
RBC: 3.67 MIL/uL — ABNORMAL LOW (ref 3.87–5.11)
RDW: 18.2 % — ABNORMAL HIGH (ref 11.5–15.5)
WBC: 4.6 K/uL (ref 4.0–10.5)
nRBC: 0 % (ref 0.0–0.2)

## 2024-06-25 LAB — MAGNESIUM: Magnesium: 2.2 mg/dL (ref 1.7–2.4)

## 2024-06-25 LAB — PRO BRAIN NATRIURETIC PEPTIDE: Pro Brain Natriuretic Peptide: >35000 pg/mL — ABNORMAL HIGH

## 2024-06-25 MED ORDER — FUROSEMIDE 10 MG/ML IJ SOLN: 60.0000 mg | Freq: Once | INTRAMUSCULAR | Status: DC

## 2024-06-25 MED ORDER — NOREPINEPHRINE 4 MG/250ML-% IV SOLN: 0.0000 ug/min | INTRAVENOUS | Status: DC

## 2024-06-25 MED ORDER — PANTOPRAZOLE SODIUM 40 MG PO TBEC
40.0000 mg | DELAYED_RELEASE_TABLET | ORAL | Status: DC
  Administered 2024-06-26 – 2024-06-28 (×3): 40 mg via ORAL
  Filled 2024-06-25 (×3): qty 1

## 2024-06-25 NOTE — Progress Notes (Signed)
 " [facility]  ROUNDING NOTE   Subjective:   [name] is a 64 year old African-American female with past medical histories including CAD with CABG and end-stage renal disease on dialysis.  Patient presents to the emergency department after suffering a syncopal episode.  Patient has been admitted for Syncope [R55] NSTEMI (non-ST elevated myocardial infarction) (HCC) [I21.4] Hypotension, unspecified hypotension type [I95.9]   Patient is known to our clinic and receives outpatient dialysis treatments at [facility] on MWF schedule, supervised by Dr. Dennise.  Patient states she does not remember episode.  States she just awoke on the floor.  Unaware if she hit her head.  Reports she has been missing dialysis treatments due to increased lower extremity pain.  She reports 1 missed treatment every 1 to 2 weeks.  Labs on ED arrival significant for serum bicarb 19, creatinine 6.26 with GFR 7, BUN 35, troponin 154, respiratory panel negative for influenza, COVID-19, and RSV.  MRSA PCR positive.  Chest x-ray shows vascular congestion without edema.  CT head negative.  CT spine shows age-related changes.  Patient placed in ICU receiving pressor support with Levophed  and amiodarone  drip.  Update:  Hemodialysis treatment Friday. Tolerated treatment yesterday. 1.5 liters removed.   Weaned off norepinephrine .  Patient is asking to go home.  Patient converted to sinus yesterday.   Objective:  Vital signs in last 24 hours:  Temp:  [97.2 F (36.2 C)-98 F (36.7 C)] 98 F (36.7 C) (01/11 0800) Pulse Rate:  [30-79] 64 (01/11 0200) Resp:  [9-21] 21 (01/11 1000) BP: (69-116)/(50-81) 106/55 (01/11 1000) SpO2:  [32 %-100 %] 95 % (01/11 1000) Weight:  [75.3 kg] 75.3 kg (01/11 0500)  Weight change: -2.1 kg Filed Weights   06/23/24 1743 06/24/24 0500 06/25/24 0500  Weight: 76.3 kg 75.9 kg 75.3 kg    Intake/Output: I/O last 3 completed shifts: In: 1172.8 [P.O.:600;  I.V.:572.8] Out: 800 [Emesis/NG output:800]   Intake/Output this shift:  Total I/O In: 240 [P.O.:240] Out: -   Physical Exam: General: NAD, sitting up in bed  Head: Normocephalic, atraumatic. Moist oral mucosal membranes  Eyes: Anicteric  Lungs:  Clear to auscultation  Heart: irregular  Abdomen:  Soft, nontender  Extremities: No peripheral edema.  Neurologic: Awake, alert,   Skin: Warm,dry, no rash  Access: Right chest PermCath, left AVF +thrill +bruit    Basic Metabolic Panel: Recent Labs  Lab 06/22/24 2128 06/23/24 0349 06/23/24 0902 06/24/24 0734 06/25/24 0354  NA 137  --   --  137 138  K 4.8  --  3.8 3.6 4.1  CL 96*  --   --  95* 97*  CO2 19*  --   --  25 25  GLUCOSE 90  --   --  124* 88  BUN 35*  --   --  25* 31*  CREATININE 6.26*  --   --  4.79* 5.94*  CALCIUM  9.8  --   --  9.9 9.8  MG  --  2.0 2.0  --  2.2  PHOS  --  6.4*  --  4.6 5.7*    Liver Function Tests: Recent Labs  Lab 06/22/24 2128 06/24/24 0734 06/25/24 0354  AST 49*  --   --   ALT 27  --   --   ALKPHOS 85  --   --   BILITOT 0.7  --   --   PROT 7.4  --   --   ALBUMIN  4.0 4.1 3.9  No results for input(s): LIPASE, AMYLASE in the last 168 hours. No results for input(s): AMMONIA in the last 168 hours.  CBC: Recent Labs  Lab 06/22/24 2128 06/23/24 0349 06/24/24 0734 06/25/24 0354  WBC 5.8 6.2 6.8 4.6  HGB 13.6 12.5 11.9* 11.7*  HCT 43.6 38.8 36.8 36.7  MCV 102.6* 100.8* 98.4 100.0  PLT 116* 125* 118* 106*    Cardiac Enzymes: No results for input(s): CKTOTAL, CKMB, CKMBINDEX, TROPONINI in the last 168 hours.  BNP: Invalid input(s): POCBNP  CBG: Recent Labs  Lab 06/24/24 2021 06/25/24 0035 06/25/24 0419 06/25/24 0757 06/25/24 1119  GLUCAP 100* 118* 86 83 116*    Microbiology: Results for orders placed or performed during the hospital encounter of 06/22/24  Resp panel by RT-PCR (RSV, Flu A&B, Covid) Anterior Nasal Swab     Status: None   Collection  Time: 06/22/24  9:28 PM   Specimen: Anterior Nasal Swab  Result Value Ref Range Status   SARS Coronavirus 2 by RT PCR NEGATIVE NEGATIVE Final    Comment: (NOTE) SARS-CoV-2 target nucleic acids are NOT DETECTED.  The SARS-CoV-2 RNA is generally detectable in upper respiratory specimens during the acute phase of infection. The lowest concentration of SARS-CoV-2 viral copies this assay can detect is 138 copies/mL. A negative result does not preclude SARS-Cov-2 infection and should not be used as the sole basis for treatment or other patient management decisions. A negative result may occur with  improper specimen collection/handling, submission of specimen other than nasopharyngeal swab, presence of viral mutation(s) within the areas targeted by this assay, and inadequate number of viral copies(<138 copies/mL). A negative result must be combined with clinical observations, patient history, and epidemiological information. The expected result is Negative.  Fact Sheet for Patients:  bloggercourse.com  Fact Sheet for Healthcare Providers:  seriousbroker.it  This test is no t yet approved or cleared by the [place]  FDA and  has been authorized for detection and/or diagnosis of SARS-CoV-2 by FDA under an Emergency Use Authorization (EUA). This EUA will remain  in effect (meaning this test can be used) for the duration of the COVID-19 declaration under Section 564(b)(1) of the Act, 21 U.S.C.section 360bbb-3(b)(1), unless the authorization is terminated  or revoked sooner.       Influenza A by PCR NEGATIVE NEGATIVE Final   Influenza B by PCR NEGATIVE NEGATIVE Final    Comment: (NOTE) The Xpert Xpress SARS-CoV-2/FLU/RSV plus assay is intended as an aid in the diagnosis of influenza from Nasopharyngeal swab specimens and should not be used as a sole basis for treatment. Nasal washings and aspirates are unacceptable for Xpert Xpress  SARS-CoV-2/FLU/RSV testing.  Fact Sheet for Patients: bloggercourse.com  Fact Sheet for Healthcare Providers: seriousbroker.it  This test is not yet approved or cleared by the [place]  FDA and has been authorized for detection and/or diagnosis of SARS-CoV-2 by FDA under an Emergency Use Authorization (EUA). This EUA will remain in effect (meaning this test can be used) for the duration of the COVID-19 declaration under Section 564(b)(1) of the Act, 21 U.S.C. section 360bbb-3(b)(1), unless the authorization is terminated or revoked.     Resp Syncytial Virus by PCR NEGATIVE NEGATIVE Final    Comment: (NOTE) Fact Sheet for Patients: bloggercourse.com  Fact Sheet for Healthcare Providers: seriousbroker.it  This test is not yet approved or cleared by the [place]  FDA and has been authorized for detection and/or diagnosis of SARS-CoV-2 by FDA under an Emergency Use Authorization (EUA). This EUA will  remain in effect (meaning this test can be used) for the duration of the COVID-19 declaration under Section 564(b)(1) of the Act, 21 U.S.C. section 360bbb-3(b)(1), unless the authorization is terminated or revoked.  Performed at [facility], [address] [place], [place] [address]   MRSA Next Gen by PCR, Nasal     Status: Abnormal   Collection Time: 06/22/24 11:51 PM   Specimen: Nasal Mucosa; Nasal Swab  Result Value Ref Range Status   MRSA by PCR Next Gen DETECTED (A) NOT DETECTED Final    Comment: RESULT CALLED TO, READ BACK BY AND VERIFIED WITH: ABIGAIL FOWLKE @0215  ON 06/23/24 SKL (NOTE) The GeneXpert MRSA Assay (FDA approved for NASAL specimens only), is one component of a comprehensive MRSA colonization surveillance program. It is not intended to diagnose MRSA infection nor to guide or monitor treatment for MRSA infections. Test performance is not FDA  approved in patients less than 65 years old. Performed at [facility], [address] [place], [place] [address]     Coagulation Studies: No results for input(s): LABPROT, INR in the last 72 hours.  Urinalysis: No results for input(s): COLORURINE, LABSPEC, PHURINE, GLUCOSEU, HGBUR, BILIRUBINUR, KETONESUR, PROTEINUR, UROBILINOGEN, NITRITE, LEUKOCYTESUR in the last 72 hours.  Invalid input(s): APPERANCEUR    Imaging: DG Chest 1 View Result Date: 06/25/2024 CLINICAL DATA:  Pulmonary edema. EXAM: CHEST  1 VIEW COMPARISON:  02/28/2024 and CT chest 02/16/2022. FINDINGS: Right IJ dialysis catheter terminates in the right atrium. Trachea is midline. Heart is enlarged. There may be subtle interstitial prominence bilaterally. Probable left pleural effusion. Findings appear unchanged. IMPRESSION: Probable mild interstitial pulmonary edema and small left pleural effusion. Electronically Signed   By: Newell Eke M.D.   On: 06/25/2024 11:11     Medications:      apixaban   5 mg Oral BID   Chlorhexidine  Gluconate Cloth  6 each Topical Q0600   feeding supplement (NEPRO CARB STEADY)  237 mL Oral TID BM   levothyroxine   75 mcg Oral Q0600   midodrine   10 mg Oral TID WC   multivitamin  1 tablet Oral QHS   oxyCODONE -acetaminophen   1 tablet Oral Once   oxyCODONE -acetaminophen   1 tablet Oral BID   rosuvastatin   20 mg Oral QHS   heparin , HYDROmorphone  (DILAUDID ) injection, ipratropium-albuterol , lidocaine -prilocaine , ondansetron  (ZOFRAN ) IV, pentafluoroprop-tetrafluoroeth, polyethylene glycol, senna  Assessment/ Plan:  [name] is a 64 y.o.  female with past medical histories including CAD with CABG and end-stage renal disease on dialysis.  Patient presents to the emergency department after suffering a syncopal episode.  Patient has been admitted for Syncope [R55] NSTEMI (non-ST elevated myocardial infarction) (HCC) [I21.4] Hypotension,  unspecified hypotension type [I95.9]  [facility]/MWF/right PermCath/72.5 kg  End-stage renal disease on hemodialysis.  Last treatment received on Friday. Continue MWF schedule. Next treatment for Monday.  - orders prepared.   2.  Hypotension with atrial fibrillation: Echo with EF 20-25%. with Syncopal episode with collapse.  Status post amiodarone .  - continue midorine   3. Anemia of chronic kidney disease Lab Results  Component Value Date   HGB 11.7 (L) 06/25/2024    Hemoglobin stable.  No need for ESA's.  4. Secondary Hyperparathyroidism: with outpatient labs: PTH 1577, phosphorus 8.7, calcium  9.3 on 05/22/24.   Lab Results  Component Value Date   PTH 627 (H) 07/26/2020   CALCIUM  9.8 06/25/2024   CAION 0.99 (L) 04/20/2024   PHOS 5.7 (H) 06/25/2024    Will  continue to monitor bone minerals during this admission.  Phosphorus elevated.  Patient prescribed calcitriol and sevelamer  outpatient.Prescribed Parsabiv outpatient.    LOS: 3 [name] 1/11/202611:54 AM  . "

## 2024-06-25 NOTE — Progress Notes (Signed)
 [facility] Cardiology    SUBJECTIVE: Resting comfortably still having leg pain no shortness of breath no chest pain denies palpitations or tachycardia   Vitals:   06/25/24 0954 06/25/24 1000 06/25/24 1100 06/25/24 1200  BP: 92/64 (!) 106/55 (!) 85/52 (!) 94/56  Pulse:   (!) 50 60  Resp: 20 (!) 21 13 (!) 21  Temp:    (!) 97 F (36.1 C)  TempSrc:    Axillary  SpO2:  95% 98% 99%  Weight:      Height:         Intake/Output Summary (Last 24 hours) at 06/25/2024 1225 Last data filed at 06/25/2024 1000 Gross per 24 hour  Intake 600.07 ml  Output 600 ml  Net 0.07 ml      PHYSICAL EXAM  General: Well developed, well nourished, in no acute distress HEENT:  Normocephalic and atramatic Neck:  No JVD.  Lungs: Clear bilaterally to auscultation and percussion. Heart: Irregular irregular. Normal S1 and S2 without gallops or murmurs.  Abdomen: Bowel sounds are positive, abdomen soft and non-tender  Msk:  Back normal, normal gait. Normal strength and tone for age. Extremities: No clubbing, cyanosis or edema.   Neuro: Alert and oriented X 3. Psych:  Good affect, responds appropriately   LABS: Basic Metabolic Panel: Recent Labs    06/23/24 0902 06/24/24 0734 06/25/24 0354  NA  --  137 138  K 3.8 3.6 4.1  CL  --  95* 97*  CO2  --  25 25  GLUCOSE  --  124* 88  BUN  --  25* 31*  CREATININE  --  4.79* 5.94*  CALCIUM   --  9.9 9.8  MG 2.0  --  2.2  PHOS  --  4.6 5.7*   Liver Function Tests: Recent Labs    06/22/24 2128 06/24/24 0734 06/25/24 0354  AST 49*  --   --   ALT 27  --   --   ALKPHOS 85  --   --   BILITOT 0.7  --   --   PROT 7.4  --   --   ALBUMIN  4.0 4.1 3.9   No results for input(s): LIPASE, AMYLASE in the last 72 hours. CBC: Recent Labs    06/24/24 0734 06/25/24 0354  WBC 6.8 4.6  HGB 11.9* 11.7*  HCT 36.8 36.7  MCV 98.4 100.0  PLT 118* 106*   Cardiac Enzymes: No results for input(s): CKTOTAL, CKMB, CKMBINDEX, TROPONINI in the last 72  hours. BNP: Invalid input(s): POCBNP D-Dimer: No results for input(s): DDIMER in the last 72 hours. Hemoglobin A1C: No results for input(s): HGBA1C in the last 72 hours. Fasting Lipid Panel: No results for input(s): CHOL, HDL, LDLCALC, TRIG, CHOLHDL, LDLDIRECT in the last 72 hours. Thyroid  Function Tests: Recent Labs    06/23/24 0902  TSH 2.450  T4TOTAL 7.7   Anemia Panel: No results for input(s): VITAMINB12, FOLATE, FERRITIN, TIBC, IRON, RETICCTPCT in the last 72 hours.  DG Chest 1 View Result Date: 06/25/2024 CLINICAL DATA:  Pulmonary edema. EXAM: CHEST  1 VIEW COMPARISON:  02/28/2024 and CT chest 02/16/2022. FINDINGS: Right IJ dialysis catheter terminates in the right atrium. Trachea is midline. Heart is enlarged. There may be subtle interstitial prominence bilaterally. Probable left pleural effusion. Findings appear unchanged. IMPRESSION: Probable mild interstitial pulmonary edema and small left pleural effusion. Electronically Signed   By: Newell Eke M.D.   On: 06/25/2024 11:11     Echo severely depressed left ventricular function EF of  20 to 25%  TELEMETRY: Atrial fibrillation wide-complex rate of 80:  ASSESSMENT AND PLAN:  Principal Problem:   Syncope Acute on chronic heart failure Hypotension improved Severe peripheral vascular disease Mild elevated troponin Coronary disease ischemic cardiomyopathy Atrial fibrillation End-stage renal disease on dialysis Critical limb ischemia End-stage renal disease on dialysis . Plan Anticoagulation for atrial fibrillation Continue heart failure management therapy End -stage renal disease on dialysis GERD continue PPI therapy for reflux management Coronary artery disease with history of ischemic cardiomyopathy continue medical therapy Peripheral vascular disease continue current therapy per vascular Hypotension resolved recommend continue medical therapy weaned off of  pressors      [name], MD 06/25/2024 12:25 PM

## 2024-06-25 NOTE — Progress Notes (Signed)
 " PROGRESS NOTE    [name]  FMW:[id number] DOB: 1960/08/20 DOA: 06/22/2024 PCP: Orlean Alan HERO, FNP  Chief Complaint  Patient presents with   Loss of Consciousness   Hypotension    Hospital Course:  [name] is a 64 y.o. female with a pmh of of ESRD on HD (MWF), COPD, CHF, bipolar disorder, chronic hypotension on midodrine , HLD and prior CVA who presented to [facility] ED via EMS following syncopal episode.    History was gathered per chart review Per patient, her normal dialysis schedule is MWF. At her most recent session on Wednesday 01/07 , she was told that her blood pressure was too low to get dialysis.  Patient states prior to arrival she may have lost consciousness, fell to the ground but was unsure if she hit her head. She reported she has been very nauseated with multiple episodes of vomiting prior to arrival and has felt generalized weakness. Per chart review, it is noted she had a history of syncope and collapse in 2023  On arrival to the ED found to be hypotensive, 71/44 received IV fluid bolus, became hypoxic.  Was placed on Levophed  for hypotension.  PCCM was contacted for admission.  Patient slowly improving, able to come off Levophed .  BP borderline soft.  Downgraded to TRH for further management on 01/11  Subjective: Patient was examined at the bedside, new to me today. Denies any complaints at the bedside, states she does not remember what happened Continues to desaturate, placed on supplemental oxygen . Nephrology and cardiology following   Objective: Vitals:   06/25/24 0500 06/25/24 0600 06/25/24 0700 06/25/24 0800  BP: 98/75 90/62 (!) 89/50 94/67  Pulse:      Resp: 13 12 13 15   Temp:    98 F (36.7 C)  TempSrc:    Axillary  SpO2:      Weight: 75.3 kg     Height:        Intake/Output Summary (Last 24 hours) at 06/25/2024 0948 Last data filed at 06/24/2024 1900 Gross per 24 hour  Intake 437.11 ml  Output 800 ml  Net -362.89 ml   Filed  Weights   06/23/24 1743 06/24/24 0500 06/25/24 0500  Weight: 76.3 kg 75.9 kg 75.3 kg    Examination: General: Pleasant African-American female, chronically ill looking, in no acute distress . HEENT: atraumatic, normocephalic, neck supple  Cardiovascular: ventricular rhythm, S1 S2, no r/m/g Pulm: Normal work of breathing, fine crackles bilaterally GI: soft, non-tender, non-distended, no rebound/guarding, bs x 4 Extremities: warm/dry, pulses + 2 R/P, no edema noted Neuro: A&O x 3, follows commands, no focal neuro deficits, PERRL   Assessment & Plan:  Acute on Chronic Hypoxic Respiratory Failure ~ Improving Suspect due to fluid overload with elevated BNP and interstitial edema on CXR Continue supplemental O2 to maintain SpO2 >92%   Circulatory Shock - cardiogenic vs hypovolemic-off norepinephrine  Chronic Hypotension Off Levophed  On midodrine  10 mg TID at home  Acute on chronic HFrEF Syncope and Collapse (Hx of the same in 2023) - unclear etiology ? Suspect syncope and collapse likely due to hypotension, trauma workup negative BNP elevated, CXR with overt edema.  Lactic acidosis resolved Echo shows EF 25 to 30%, severely decreased, LV global hypokinesis.  RV systolic function severely reduced.  Mild MR, severe TR Not on diuresis as patient is anuric Metoprolol  on hold Cardiology following, appreciate recs Telemetry monitoring  Mildly Elevated Troponin CAD s/p, ischemic cardiomyopathy CVA Elevated troponin likely due to demand ischemia  On Eliquis , statin  Atrial Fibrillation on Eliquis  Metoprolol  on hold due to HR in 50-60's On Eliquis   ESRD on HD (MWF) Acute on Chronic Renal Failure Was given 1 L NS on admission due to hypotension Nephrology follow-up for dialysis and volume overload Next dialysis tomorrow  Left foot pain Chronic Pain s/t Vascular Ulcers Seen by vascular surgery, appreciate recs Perfusion likely suboptimal, no further workup due to cardiopulmonary  status Normal angiogram of LLE 2 months ago Limited interventional options per vascular surgeon Pain management  GERD PPI  COPD Not in exacerbation, DuoNebs as needed  Chronic Thrombocytopenia Monitor platelet count  Incidental findings Severe erosive endplate changes at C5-C6, chronic but progressive.  Differentials include chronic osteomyelitis vs severe degenerative changes with bony remodeling -  Denies pain.  No leukocytosis, no evidence of infection clinically, will monitor  Goals of Care Palliative care following, appreciate recs Full code OT eval    DVT prophylaxis: Eliquis    Code Status: Full Code Disposition:  TBD  Consultants:  Cardiology Nephrology Vascular surgery PCCM  Procedures:  None  Antimicrobials:  Anti-infectives (From admission, onward)    None       Data Reviewed: I have personally reviewed following labs and imaging studies CBC: Recent Labs  Lab 06/22/24 2128 06/23/24 0349 06/24/24 0734 06/25/24 0354  WBC 5.8 6.2 6.8 4.6  HGB 13.6 12.5 11.9* 11.7*  HCT 43.6 38.8 36.8 36.7  MCV 102.6* 100.8* 98.4 100.0  PLT 116* 125* 118* 106*   Basic Metabolic Panel: Recent Labs  Lab 06/22/24 2128 06/23/24 0349 06/23/24 0902 06/24/24 0734 06/25/24 0354  NA 137  --   --  137 138  K 4.8  --  3.8 3.6 4.1  CL 96*  --   --  95* 97*  CO2 19*  --   --  25 25  GLUCOSE 90  --   --  124* 88  BUN 35*  --   --  25* 31*  CREATININE 6.26*  --   --  4.79* 5.94*  CALCIUM  9.8  --   --  9.9 9.8  MG  --  2.0 2.0  --  2.2  PHOS  --  6.4*  --  4.6 5.7*   GFR: Estimated Creatinine Clearance: 10.3 mL/min (A) (by C-G formula based on SCr of 5.94 mg/dL (H)). Liver Function Tests: Recent Labs  Lab 06/22/24 2128 06/24/24 0734 06/25/24 0354  AST 49*  --   --   ALT 27  --   --   ALKPHOS 85  --   --   BILITOT 0.7  --   --   PROT 7.4  --   --   ALBUMIN  4.0 4.1 3.9   CBG: Recent Labs  Lab 06/24/24 1550 06/24/24 2021 06/25/24 0035 06/25/24 0419  06/25/24 0757  GLUCAP 89 100* 118* 86 83    Recent Results (from the past 240 hours)  Resp panel by RT-PCR (RSV, Flu A&B, Covid) Anterior Nasal Swab     Status: None   Collection Time: 06/22/24  9:28 PM   Specimen: Anterior Nasal Swab  Result Value Ref Range Status   SARS Coronavirus 2 by RT PCR NEGATIVE NEGATIVE Final    Comment: (NOTE) SARS-CoV-2 target nucleic acids are NOT DETECTED.  The SARS-CoV-2 RNA is generally detectable in upper respiratory specimens during the acute phase of infection. The lowest concentration of SARS-CoV-2 viral copies this assay can detect is 138 copies/mL. A negative result does not preclude SARS-Cov-2 infection and  should not be used as the sole basis for treatment or other patient management decisions. A negative result may occur with  improper specimen collection/handling, submission of specimen other than nasopharyngeal swab, presence of viral mutation(s) within the areas targeted by this assay, and inadequate number of viral copies(<138 copies/mL). A negative result must be combined with clinical observations, patient history, and epidemiological information. The expected result is Negative.  Fact Sheet for Patients:  bloggercourse.com  Fact Sheet for Healthcare Providers:  seriousbroker.it  This test is no t yet approved or cleared by the [place]  FDA and  has been authorized for detection and/or diagnosis of SARS-CoV-2 by FDA under an Emergency Use Authorization (EUA). This EUA will remain  in effect (meaning this test can be used) for the duration of the COVID-19 declaration under Section 564(b)(1) of the Act, 21 U.S.C.section 360bbb-3(b)(1), unless the authorization is terminated  or revoked sooner.       Influenza A by PCR NEGATIVE NEGATIVE Final   Influenza B by PCR NEGATIVE NEGATIVE Final    Comment: (NOTE) The Xpert Xpress SARS-CoV-2/FLU/RSV plus assay is intended as an  aid in the diagnosis of influenza from Nasopharyngeal swab specimens and should not be used as a sole basis for treatment. Nasal washings and aspirates are unacceptable for Xpert Xpress SARS-CoV-2/FLU/RSV testing.  Fact Sheet for Patients: bloggercourse.com  Fact Sheet for Healthcare Providers: seriousbroker.it  This test is not yet approved or cleared by the [place]  FDA and has been authorized for detection and/or diagnosis of SARS-CoV-2 by FDA under an Emergency Use Authorization (EUA). This EUA will remain in effect (meaning this test can be used) for the duration of the COVID-19 declaration under Section 564(b)(1) of the Act, 21 U.S.C. section 360bbb-3(b)(1), unless the authorization is terminated or revoked.     Resp Syncytial Virus by PCR NEGATIVE NEGATIVE Final    Comment: (NOTE) Fact Sheet for Patients: bloggercourse.com  Fact Sheet for Healthcare Providers: seriousbroker.it  This test is not yet approved or cleared by the [place]  FDA and has been authorized for detection and/or diagnosis of SARS-CoV-2 by FDA under an Emergency Use Authorization (EUA). This EUA will remain in effect (meaning this test can be used) for the duration of the COVID-19 declaration under Section 564(b)(1) of the Act, 21 U.S.C. section 360bbb-3(b)(1), unless the authorization is terminated or revoked.  Performed at [facility], [address] [place], [place] [address]   MRSA Next Gen by PCR, Nasal     Status: Abnormal   Collection Time: 06/22/24 11:51 PM   Specimen: Nasal Mucosa; Nasal Swab  Result Value Ref Range Status   MRSA by PCR Next Gen DETECTED (A) NOT DETECTED Final    Comment: RESULT CALLED TO, READ BACK BY AND VERIFIED WITH: ABIGAIL FOWLKE @0215  ON 06/23/24 SKL (NOTE) The GeneXpert MRSA Assay (FDA approved for NASAL specimens only), is one component of a  comprehensive MRSA colonization surveillance program. It is not intended to diagnose MRSA infection nor to guide or monitor treatment for MRSA infections. Test performance is not FDA approved in patients less than 65 years old. Performed at [facility], [address], [place], [place] [address]      Radiology Studies: No results found.  Scheduled Meds:  apixaban   5 mg Oral BID   Chlorhexidine  Gluconate Cloth  6 each Topical Q0600   feeding supplement (NEPRO CARB STEADY)  237 mL Oral TID BM   levothyroxine   75 mcg Oral Q0600   midodrine   10 mg Oral TID WC   multivitamin  1 tablet Oral QHS   oxyCODONE -acetaminophen   1 tablet Oral Once   oxyCODONE -acetaminophen   1 tablet Oral BID   rosuvastatin   20 mg Oral QHS   Continuous Infusions:   LOS: 3 days  MDM: Patient is high risk for one or more organ failure.  They necessitate ongoing hospitalization for continued IV therapies and subsequent lab monitoring. Total time spent interpreting labs and vitals, reviewing the medical record, coordinating care amongst consultants and care team members, directly assessing and discussing care with the patient and/or family: 55 min Laree Lock, MD [facility]  To contact the attending physician between 7A-7P please use Epic Chat. To contact the covering physician during after hours 7P-7A, please review Amion.  06/25/2024, 9:48 AM   *This document has been created with the assistance of dictation software. Please excuse typographical errors. *   "

## 2024-06-25 NOTE — Plan of Care (Signed)
 Discussed with patient plan of care for the evening, pain management and wound change plan with some teach back displayed.  Stayed in room with patient for about an hour to not rush her wound dressing since her leg is very tender and sore at this time.  Pain medication given before wound change performed as well.    Problem: Education: Goal: Knowledge of General Education information will improve Description: Including pain rating scale, medication(s)/side effects and non-pharmacologic comfort measures Outcome: Progressing   Problem: Health Behavior/Discharge Planning: Goal: Ability to manage health-related needs will improve Outcome: Progressing

## 2024-06-25 NOTE — Progress Notes (Signed)
 PHARMACY CONSULT NOTE - ELECTROLYTES  Pharmacy Consult for Electrolyte Monitoring and Replacement   Recent Labs: Height: 5' 6 (167.6 cm) Weight: 75.3 kg (166 lb 0.1 oz) IBW/kg (Calculated) : 59.3 Estimated Creatinine Clearance: 10.3 mL/min (A) (by C-G formula based on SCr of 5.94 mg/dL (H)). Potassium (mmol/L)  Date Value  06/25/2024 4.1  07/11/2014 4.7   Magnesium (mg/dL)  Date Value  98/88/7973 2.2  04/10/2014 1.9   Calcium  (mg/dL)  Date Value  98/88/7973 9.8   Calcium , Total (mg/dL)  Date Value  98/74/7983 8.3 (L)   Albumin  (g/dL)  Date Value  98/88/7973 3.9  07/07/2014 3.1 (L)   Phosphorus (mg/dL)  Date Value  98/88/7973 5.7 (H)  07/09/2014 3.9   Sodium (mmol/L)  Date Value  06/25/2024 138  07/09/2014 139   Corrected Ca: 9.8 mg/dL  Assessment  [name] is a 64 y.o. female presenting with syncopal episode. PMH significant for  ESRD on HD (MWF), COPD, CHF, bipolar disorder, chronic hypotension on midodrine , HLD and prior CVA . Pharmacy has been consulted to monitor and replace electrolytes.  Goal of Therapy: Electrolytes WNL  Plan:  No supplementation needed at this time Check BMP, Mg, Phos with AM labs  Thank you for allowing pharmacy to be a part of this patient's care.  Adriana JONETTA Bolster, PharmD Clinical Pharmacist 06/25/2024 6:53 AM

## 2024-06-25 NOTE — Plan of Care (Signed)
  Problem: Clinical Measurements: Goal: Ability to maintain clinical measurements within normal limits will improve Outcome: Progressing Goal: Diagnostic test results will improve Outcome: Progressing Goal: Cardiovascular complication will be avoided Outcome: Progressing   Problem: Activity: Goal: Risk for activity intolerance will decrease Outcome: Progressing   

## 2024-06-25 NOTE — Progress Notes (Signed)
 "                                                                                                                                                                                               Palliative Care Progress Note, Assessment & Plan   Patient Name: [name]       Date: 06/25/2024 DOB: 22-Jun-1960  Age: 64 y.o. MRN#: [id number] Attending Physician: Jerelene Critchley, MD Primary Care Physician: Orlean Alan HERO, FNP Admit Date: 06/22/2024  Subjective: Denies pain. Nausea/emesis has resolved. No CP/SOB. Eating well with good appetite. Patient states several time she wants to go home.   HPI:  HPI/Patient Profile: 64 y.o. female  with past medical history significant for ESRD on HD (M,W,F), COPD, HFrEF (25-30%), bipolar disorder, chronic hypotension on midodrine , HLD and previous CVA. Patient presented to ED 06/22/2024 c/o syncopal episode with possible fall.  Patient reported when she arrived at her HD 1/7, she was todl her BP was too low for dialysis. She reported continuing nausea with multiple episodes of emesis with weakness. She thinks she may have passes out but is not sure what happened.  ED exam found patient to be hypotensive on arrival at 77/44.    ED labs significant for troponin 154-->155. Lactic acid 3.0-->2.0. WBC WNL 5.8, Hgb 13.6. CMP not concerning due to being dialysis patient.    ED vitals 70/41, HR 82, RR 13, SpO2 92% 3L, 97.4 F   CXR  1. Cardiomegaly with vascular congestion without overt edema.   CT Head demonstrated:  1. No acute intracranial abnormality. 2. 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   CT cervical spine showed: 1. No traumatic injury to the cervical spine. 2. Severe erosive endplate changes at C5-C6, chronic but progressive. Differential considerations include chronic osteomyelitis versus severe degenerative changes with bony remodeling. Correlate with laboratory evaluation to exclude infection.  PCCM was consulted for admission and management  of acute on chronic hypoxic respiratory failure, circulatory shock, syncope and collapse, chronic hypotension on midodrine , mildly elevated troponin s/t demand ischemia.   PMT was consulted for assistance with goals of care conversations.   Summary of counseling/coordination of care Chart reviewed:   Labs: Stable, increase in phosphorus 4.6-->5.7. BMP remains elevated at >35,000. Hgb stable. Renal function expected in setting of HD    Latest Ref Rng & Units 06/25/2024    3:54 AM 06/24/2024    7:34 AM 06/23/2024    3:49 AM  CBC  WBC 4.0 - 10.5 K/uL 4.6  6.8  6.2   Hemoglobin 12.0 - 15.0 g/dL 88.2  88.0  87.4   Hematocrit 36.0 -  46.0 % 36.7  36.8  38.8   Platelets 150 - 400 K/uL 106  118  125       Latest Ref Rng & Units 06/25/2024    3:54 AM 06/24/2024    7:34 AM 06/23/2024    9:02 AM  CMP  Glucose 70 - 99 mg/dL 88  875    BUN 8 - 23 mg/dL 31  25    Creatinine 9.55 - 1.00 mg/dL 4.05  5.20    Sodium 864 - 145 mmol/L 138  137    Potassium 3.5 - 5.1 mmol/L 4.1  3.6  3.8   Chloride 98 - 111 mmol/L 97  95    CO2 22 - 32 mmol/L 25  25    Calcium  8.9 - 10.3 mg/dL 9.8  9.9       Vitals: Blood pressure 93/60, pulse (!) 57, temperature (!) 97.4 F (36.3 C), temperature source Axillary, resp. rate 13, height 5' 6 (1.676 m), weight 75.3 kg, SpO2 94%.   Progress notes: Reviewed progress notes from PCCM, [facility], cardiology, nephrology, vascular surgery and nursing staff.   Imaging: CXR today shows probable mild interstitial pulmonary edema and small left pleural effusion.  MAR: No changes made   ACP documents: None on file   After reviewing the patient's chart and assessing the patient at bedside, I spoke with patient in regards to symptom management and goals of care.   Ill-appearing, very pleasant female lying in bed with daughter, [name], granddaughter and primary RN at bedside. She is alert and oriented. She is able to participate in conversation making her wishes known. Respirations  are even and unlabored. She is in no distress.   Patient continues to state she wants to go home. Daughter states her mother needs to stay in hospital until her low blood pressure is stabilized. [name] shares she feels her mother almost died. Patient continues to request to go home.   Therapeutic silence and active listening provided for patient and daughter to share their thoughts and emotions regarding current medical situation.  Emotional support provided.  Physical Exam Vitals reviewed.  Constitutional:      General: She is not in acute distress.    Appearance: She is ill-appearing.  HENT:     Head: Normocephalic and atraumatic.     Mouth/Throat:     Mouth: Mucous membranes are moist.  Pulmonary:     Effort: Pulmonary effort is normal. No respiratory distress.  Musculoskeletal:     Right lower leg: No edema.     Left lower leg: No edema.  Skin:    General: Skin is warm and dry.  Neurological:     Mental Status: She is alert and oriented to person, place, and time.  Psychiatric:        Mood and Affect: Mood normal.        Behavior: Behavior normal.        Thought Content: Thought content normal.        Judgment: Judgment normal.   Recommendations/Plan: FULL CODE status as previously documented    Continue current supportive interventions Following TOC for disposition            Discussed plan of care with primary RN.  I personally spent a total of 50 minutes in the care of the patient today including preparing to see the patient, getting/reviewing separately obtained history, performing a medically appropriate exam/evaluation, counseling and educating, referring and communicating with other health care professionals, documenting clinical information in the  EHR, and independently interpreting results.    Devere Sacks, ELNITA- [facility] Palliative Medicine Team  06/25/2024 8:52 AM  Office [phone]  Pager [phone]     "

## 2024-06-26 DIAGNOSIS — Z789 Other specified health status: Secondary | ICD-10-CM | POA: Diagnosis not present

## 2024-06-26 DIAGNOSIS — E8770 Fluid overload, unspecified: Secondary | ICD-10-CM | POA: Diagnosis not present

## 2024-06-26 DIAGNOSIS — R55 Syncope and collapse: Secondary | ICD-10-CM | POA: Diagnosis not present

## 2024-06-26 DIAGNOSIS — Z515 Encounter for palliative care: Secondary | ICD-10-CM | POA: Diagnosis not present

## 2024-06-26 DIAGNOSIS — Z7189 Other specified counseling: Secondary | ICD-10-CM | POA: Diagnosis not present

## 2024-06-26 LAB — GLUCOSE, CAPILLARY
Glucose-Capillary: 91 mg/dL (ref 70–99)
Glucose-Capillary: 86 mg/dL (ref 70–99)
Glucose-Capillary: 103 mg/dL — ABNORMAL HIGH (ref 70–99)
Glucose-Capillary: 106 mg/dL — ABNORMAL HIGH (ref 70–99)

## 2024-06-26 LAB — RENAL FUNCTION PANEL
Albumin: 4 g/dL (ref 3.5–5.0)
Anion gap: 19 — ABNORMAL HIGH (ref 5–15)
BUN: 40 mg/dL — ABNORMAL HIGH (ref 8–23)
CO2: 24 mmol/L (ref 22–32)
Calcium: 10.1 mg/dL (ref 8.9–10.3)
Chloride: 98 mmol/L (ref 98–111)
Creatinine, Ser: 7.21 mg/dL — ABNORMAL HIGH (ref 0.44–1.00)
GFR, Estimated: 6 mL/min — ABNORMAL LOW
Glucose, Bld: 89 mg/dL (ref 70–99)
Phosphorus: 6.5 mg/dL — ABNORMAL HIGH (ref 2.5–4.6)
Potassium: 4 mmol/L (ref 3.5–5.1)
Sodium: 141 mmol/L (ref 135–145)

## 2024-06-26 LAB — HEPATITIS B SURFACE ANTIBODY, QUANTITATIVE: Hep B S AB Quant (Post): <3.5 m[IU]/mL — ABNORMAL LOW

## 2024-06-26 LAB — CBC
HCT: 37.4 % (ref 36.0–46.0)
Hemoglobin: 11.6 g/dL — ABNORMAL LOW (ref 12.0–15.0)
MCH: 31.1 pg (ref 26.0–34.0)
MCHC: 31 g/dL (ref 30.0–36.0)
MCV: 100.3 fL — ABNORMAL HIGH (ref 80.0–100.0)
Platelets: 110 K/uL — ABNORMAL LOW (ref 150–400)
RBC: 3.73 MIL/uL — ABNORMAL LOW (ref 3.87–5.11)
RDW: 18.2 % — ABNORMAL HIGH (ref 11.5–15.5)
WBC: 5 K/uL (ref 4.0–10.5)
nRBC: 0 % (ref 0.0–0.2)

## 2024-06-26 LAB — MAGNESIUM: Magnesium: 2.1 mg/dL (ref 1.7–2.4)

## 2024-06-26 MED ORDER — MELATONIN 5 MG PO TABS
5.0000 mg | ORAL_TABLET | Freq: Once | ORAL | Status: AC
  Administered 2024-06-26: 5 mg via ORAL
  Filled 2024-06-26: qty 1

## 2024-06-26 MED ORDER — HEPARIN SODIUM (PORCINE) 1000 UNIT/ML IJ SOLN
INTRAMUSCULAR | Status: AC
  Filled 2024-06-26: qty 5

## 2024-06-26 MED ORDER — OXYCODONE-ACETAMINOPHEN 5-325 MG PO TABS
1.0000 | ORAL_TABLET | Freq: Four times a day (QID) | ORAL | Status: DC | PRN
  Administered 2024-06-26 – 2024-06-28 (×7): 1 via ORAL
  Filled 2024-06-26 (×7): qty 1

## 2024-06-26 MED ORDER — CHLORHEXIDINE GLUCONATE CLOTH 2 % EX PADS
6.0000 | MEDICATED_PAD | Freq: Every day | CUTANEOUS | Status: DC
  Administered 2024-06-28: 6 via TOPICAL

## 2024-06-26 MED ORDER — SODIUM THIOSULFATE 250 MG/ML IV SOLN
25.0000 g | INTRAVENOUS | Status: DC
  Administered 2024-06-26 – 2024-06-28 (×2): 25 g via INTRAVENOUS
  Filled 2024-06-26 (×2): qty 100

## 2024-06-26 NOTE — TOC Initial Note (Signed)
 Transition of Care [facility]) - Initial/Assessment Note    Patient Details  Name: [name] MRN: [id number] Date of Birth: 03-10-1961  [facility] CM/SW Contact:    Corrie JINNY Ruts, LCSW Phone Number: 06/26/2024, 3:52 PM  Clinical Narrative:                 Chart reviewed. I was able to speak with the patient at bedside today. I introduced myself, my role, and reason for visit. The patient PCP is Alan Arrant. The patient reports that she lives by herself.   The patient reports that she was able to complete task independently. The patient reports that her brother drives her to medical appointments. The patient reports that her brother will assidt her during D/C.   The patient reports that she uses [facility] pharmacy. The patient reports that she is currently active with [facility]. The patient reports that she has at [facility] health care almost a week ago and was there for a week. The patient reports that she does not want to go back into a SNF.   The patient reports that she has a cane, walker, shower seat and BSC in the home. The patient has no concerns or questions during the time of the visit.   SW called Channing with [employer] and she confirmed that the patient is active and receives nursing, PT/OT.    Barriers to Discharge: Continued Medical Work up   Patient Goals and CMS Choice            Expected Discharge Plan and Services                                              Prior Living Arrangements/Services   Lives with:: Self Patient language and need for interpreter reviewed:: Yes        Need for Family Participation in Patient Care: Yes (Comment) Care giver support system in place?: Yes (comment)   Criminal Activity/Legal Involvement Pertinent to Current Situation/Hospitalization: No - Comment as needed  Activities of Daily Living   ADL Screening (condition at time of admission) Independently performs ADLs?: Yes (appropriate for  developmental age) Is the patient deaf or have difficulty hearing?: No Does the patient have difficulty seeing, even when wearing glasses/contacts?: No Does the patient have difficulty concentrating, remembering, or making decisions?: No  Permission Sought/Granted                  Emotional Assessment Appearance:: Appears stated age Attitude/Demeanor/Rapport: Gracious Affect (typically observed): Calm Orientation: : Oriented to Place, Oriented to Self, Oriented to  Time, Oriented to Situation Alcohol / Substance Use: Not Applicable Psych Involvement: No (comment)  Admission diagnosis:  Syncope [R55] NSTEMI (non-ST elevated myocardial infarction) (HCC) [I21.4] Hypotension, unspecified hypotension type [I95.9] Patient Active Problem List   Diagnosis Date Noted   Syncope 06/22/2024   Acute idiopathic gout of left foot 04/13/2024   Bunion of great toe 04/13/2024   Other psychoactive substance abuse, uncomplicated (HCC) 03/15/2024   Bipolar disorder in full remission, most recent episode unspecified type 03/15/2024   Chronic pain syndrome 03/15/2024   De Quervain's tenosynovitis, right 07/06/2023   NPDH (new persistent daily headache) 07/06/2023   Chest pain 05/17/2023   Hypertension    Physical deconditioning 03/27/2023   Hallux valgus of right foot 01/07/2023   Pain of left lower extremity 01/07/2023  Peripheral vascular disease of lower extremity 01/02/2023   Other cirrhosis of liver (HCC) 10/08/2022   Ascites 12/19/2021   Hepatitis C antibody positive in blood 12/19/2021   Thrombocytopenia 08/08/2021   Paroxysmal atrial fibrillation (HCC)    Hypothyroidism    Swelling of limb 09/22/2016   Generalized weakness 10/15/2014   ESRD on hemodialysis (HCC) 10/15/2014   Chronic hypotension 10/15/2014   Chronic combined systolic and diastolic CHF (congestive heart failure) (HCC) 10/15/2014   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   Pericardial effusion 04/12/2014   Chest  wall pain following surgery 04/12/2014   Lactic acidosis 03/26/2014   Mild dementia associated with other underlying disease, without behavioral disturbance, psychotic disturbance, mood disturbance, or anxiety (HCC) 03/25/2014   Atrial flutter (HCC) 03/22/2014   Superficial incisional surgical site infection 03/14/2014   Exhausted vascular access 02/24/2014   Anemia of chronic disease 02/24/2014   S/P CABG x 3 02/21/2014   Hyperlipidemia 02/13/2014   History of hepatitis C 02/13/2014   HFrEF (heart failure with reduced ejection fraction) (HCC) 02/13/2014   Retrosternal chest pain 05/30/2013   Gallstones 05/29/2013   PCP:  Orlean Alan HERO, FNP Pharmacy:   [facility] - [place], [place] - [address] [place] [address] Phone: [phone] Fax: [phone]     Social Drivers of Health (SDOH) Social History: SDOH Screenings   Food Insecurity: No Food Insecurity (06/23/2024)  Housing: Low Risk (06/23/2024)  Transportation Needs: No Transportation Needs (06/23/2024)  Utilities: Not At Risk (06/23/2024)  Depression (PHQ2-9): Low Risk (07/06/2023)  Financial Resource Strain: Low Risk  (05/31/2024)   Received from [facility]  Tobacco Use: High Risk (06/23/2024)   SDOH Interventions:     Readmission Risk Interventions    06/26/2024   12:20 PM 12/23/2021   11:41 AM  Readmission Risk Prevention Plan  Transportation Screening Complete Complete  PCP or Specialist Appt within 3-5 Days Complete   HRI or Home Care Consult Complete Complete  Social Work Consult for Recovery Care Planning/Counseling Complete Complete  Palliative Care Screening Not Applicable Not Applicable  Medication Review Oceanographer) Complete Complete

## 2024-06-26 NOTE — Progress Notes (Signed)
 Bedside ICU 11  Informed consent signed and in chart.    TX duration:3:15     Hand-off given to patient's nurse. Pt c/o 10/10 L foot Pain  primary nurse notified    Access used: R hd perncath  Access issues: none   Total UF removed: 1.0L Medication(s) given: na thiosulfate  Post HD VS: wnl      Olivia Hurst LPN [facility]

## 2024-06-26 NOTE — Progress Notes (Signed)
 SPIRITUAL CARE AND COUNSELING CONSULT NOTE  Patient stated that all their affairs were in order and they did not need an advance directive. Chaplain explain the advance directive takes care of patient's health wishes if they are unable to say for some reason. Patient did not want to complete an advance directive.   SPIRITUAL ENCOUNTER                                                                                                                                                                      Type of Visit: Initial Care provided to:: Patient Reason for visit: Advance directives OnCall Visit: No   SPIRITUAL FRAMEWORK  Presenting Themes: Significant life change   GOALS       INTERVENTIONS   Spiritual Care Interventions Made: Compassionate presence    INTERVENTION OUTCOMES   Outcomes: Connection to spiritual care  SPIRITUAL CARE PLAN        If immediate needs arise, please contact [facility] 24 hour on call [phone]   [name]  06/26/2024 3:37 PM

## 2024-06-26 NOTE — Progress Notes (Signed)
 Report called to Devaughn, care nurse for room 160.

## 2024-06-26 NOTE — Progress Notes (Signed)
 " PROGRESS NOTE    [name]  FMW:[id number] DOB: Mar 22, 1961 DOA: 06/22/2024 PCP: Orlean Alan HERO, FNP  Chief Complaint  Patient presents with   Loss of Consciousness   Hypotension    Hospital Course:  [name] is a 64 y.o. female with a pmh of of ESRD on HD (MWF), COPD, CHF, bipolar disorder, chronic hypotension on midodrine , HLD and prior CVA who presented to [facility] ED via EMS following syncopal episode.    History was gathered per chart review Per patient, her normal dialysis schedule is MWF. At her most recent session on Wednesday 01/07 , she was told that her blood pressure was too low to get dialysis.  Patient states prior to arrival she may have lost consciousness, fell to the ground but was unsure if she hit her head. She reported she has been very nauseated with multiple episodes of vomiting prior to arrival and has felt generalized weakness. Per chart review, it is noted she had a history of syncope and collapse in 2023  On arrival to the ED found to be hypotensive, 71/44 received IV fluid bolus, became hypoxic.  Was placed on Levophed  for hypotension.  PCCM was contacted for admission.  Patient slowly improving, able to come off Levophed .  BP borderline soft.  Downgraded to TRH for further management on 01/11  Subjective: Patient was examined at the bedside, reports Left foot pain with difficulty walking Seen by nephrology, cardiology Discussed with vascular surgery Dr. Marea, patient has been having similar symptoms with workup done in the last 2 months We will continue pain management, PT/OT eval pending   Objective: Vitals:   06/26/24 1745 06/26/24 1750 06/26/24 1800 06/26/24 1905  BP: 104/69  104/69 (!) 87/64  Pulse: (!) 59 66  66  Resp:    (!) 22  Temp:  97.8 F (36.6 C)  99 F (37.2 C)  TempSrc:  Oral  Oral  SpO2: 94%   92%  Weight:      Height:        Intake/Output Summary (Last 24 hours) at 06/26/2024 1931 Last data filed at 06/26/2024  1750 Gross per 24 hour  Intake 480 ml  Output 1000 ml  Net -520 ml   Filed Weights   06/24/24 0500 06/25/24 0500 06/26/24 0409  Weight: 75.9 kg 75.3 kg 77.2 kg    Examination: General: Pleasant African-American female, chronically ill looking, in no acute distress . HEENT: atraumatic, normocephalic, neck supple  Cardiovascular: ventricular rhythm, S1 S2, no r/m/g Pulm: Normal work of breathing, fine crackles bilaterally GI: soft, non-tender, non-distended, no rebound/guarding, bs x 4 Extremities: warm/dry, pulses + 2 R/P, no edema noted Neuro: A&O x 3, follows commands, no focal neuro deficits, PERRL   Assessment & Plan:  Acute on Chronic Hypoxic Respiratory Failure ~ Improving Suspect due to fluid overload with elevated BNP and interstitial edema on CXR On 5L [place] Continue supplemental O2 to maintain SpO2 >92%   Circulatory Shock - cardiogenic vs hypovolemic-off norepinephrine  Chronic Hypotension Off Levophed  On midodrine  10 mg TID at home  Chronic HFrEF Syncope and Collapse (Hx of the same in 2023) - unclear etiology ? Suspect syncope and collapse likely due to hypotension, trauma workup negative BNP elevated, CXR with overt edema.  Lactic acidosis resolved Echo shows EF 25 to 30%, severely decreased, LV global hypokinesis.  RV systolic function severely reduced.  Mild MR, severe TR Not on diuresis as patient is anuric Metoprolol  on hold due to bradycardia Cardiology following, appreciate  recs Recommend following up with primary cardiology Dr. Fernand for outpatient cardiac monitor Telemetry monitoring  Mildly Elevated Troponin CAD s/p, ischemic cardiomyopathy CVA Elevated troponin likely due to demand ischemia On Eliquis , statin  Atrial Fibrillation on Eliquis  Metoprolol  on hold due to HR in 50-60's On Eliquis   ESRD on HD (MWF) Acute on Chronic Renal Failure Was given 1 L NS on admission due to hypotension Nephrology follow-up for dialysis and volume  overload  Left foot pain Chronic Pain s/t Vascular Ulcers Seen by vascular surgery, appreciate recs Perfusion likely suboptimal, no further workup due to cardiopulmonary status Normal angiogram of LLE 2 months ago Limited interventional options per vascular surgeon Pain management, requiring Percocet, will discontinue IV Dilaudid   GERD PPI  COPD Not in exacerbation, DuoNebs as needed  Chronic Thrombocytopenia Monitor platelet count  Incidental findings Severe erosive endplate changes at C5-C6, chronic but progressive.  Differentials include chronic osteomyelitis vs severe degenerative changes with bony remodeling -  Denies pain.  No leukocytosis, no evidence of infection clinically, will monitor  Goals of Care Palliative care following, appreciate recs Full code PT/OT  eval    DVT prophylaxis: Eliquis    Code Status: Full Code Disposition:  TBD  Consultants:  Cardiology Nephrology Vascular surgery PCCM  Procedures:  None  Antimicrobials:  Anti-infectives (From admission, onward)    None       Data Reviewed: I have personally reviewed following labs and imaging studies CBC: Recent Labs  Lab 06/22/24 2128 06/23/24 0349 06/24/24 0734 06/25/24 0354 06/26/24 0358  WBC 5.8 6.2 6.8 4.6 5.0  HGB 13.6 12.5 11.9* 11.7* 11.6*  HCT 43.6 38.8 36.8 36.7 37.4  MCV 102.6* 100.8* 98.4 100.0 100.3*  PLT 116* 125* 118* 106* 110*   Basic Metabolic Panel: Recent Labs  Lab 06/22/24 2128 06/23/24 0349 06/23/24 0902 06/24/24 0734 06/25/24 0354 06/26/24 0358  NA 137  --   --  137 138 141  K 4.8  --  3.8 3.6 4.1 4.0  CL 96*  --   --  95* 97* 98  CO2 19*  --   --  25 25 24   GLUCOSE 90  --   --  124* 88 89  BUN 35*  --   --  25* 31* 40*  CREATININE 6.26*  --   --  4.79* 5.94* 7.21*  CALCIUM  9.8  --   --  9.9 9.8 10.1  MG  --  2.0 2.0  --  2.2 2.1  PHOS  --  6.4*  --  4.6 5.7* 6.5*   GFR: Estimated Creatinine Clearance: 8.6 mL/min (A) (by C-G formula based on  SCr of 7.21 mg/dL (H)). Liver Function Tests: Recent Labs  Lab 06/22/24 2128 06/24/24 0734 06/25/24 0354 06/26/24 0358  AST 49*  --   --   --   ALT 27  --   --   --   ALKPHOS 85  --   --   --   BILITOT 0.7  --   --   --   PROT 7.4  --   --   --   ALBUMIN  4.0 4.1 3.9 4.0   CBG: Recent Labs  Lab 06/25/24 1119 06/25/24 2334 06/26/24 0341 06/26/24 0735 06/26/24 1137  GLUCAP 116* 96 91 86 103*    Recent Results (from the past 240 hours)  Resp panel by RT-PCR (RSV, Flu A&B, Covid) Anterior Nasal Swab     Status: None   Collection Time: 06/22/24  9:28 PM   Specimen:  Anterior Nasal Swab  Result Value Ref Range Status   SARS Coronavirus 2 by RT PCR NEGATIVE NEGATIVE Final    Comment: (NOTE) SARS-CoV-2 target nucleic acids are NOT DETECTED.  The SARS-CoV-2 RNA is generally detectable in upper respiratory specimens during the acute phase of infection. The lowest concentration of SARS-CoV-2 viral copies this assay can detect is 138 copies/mL. A negative result does not preclude SARS-Cov-2 infection and should not be used as the sole basis for treatment or other patient management decisions. A negative result may occur with  improper specimen collection/handling, submission of specimen other than nasopharyngeal swab, presence of viral mutation(s) within the areas targeted by this assay, and inadequate number of viral copies(<138 copies/mL). A negative result must be combined with clinical observations, patient history, and epidemiological information. The expected result is Negative.  Fact Sheet for Patients:  bloggercourse.com  Fact Sheet for Healthcare Providers:  seriousbroker.it  This test is no t yet approved or cleared by the [place]  FDA and  has been authorized for detection and/or diagnosis of SARS-CoV-2 by FDA under an Emergency Use Authorization (EUA). This EUA will remain  in effect (meaning this test can be  used) for the duration of the COVID-19 declaration under Section 564(b)(1) of the Act, 21 U.S.C.section 360bbb-3(b)(1), unless the authorization is terminated  or revoked sooner.       Influenza A by PCR NEGATIVE NEGATIVE Final   Influenza B by PCR NEGATIVE NEGATIVE Final    Comment: (NOTE) The Xpert Xpress SARS-CoV-2/FLU/RSV plus assay is intended as an aid in the diagnosis of influenza from Nasopharyngeal swab specimens and should not be used as a sole basis for treatment. Nasal washings and aspirates are unacceptable for Xpert Xpress SARS-CoV-2/FLU/RSV testing.  Fact Sheet for Patients: bloggercourse.com  Fact Sheet for Healthcare Providers: seriousbroker.it  This test is not yet approved or cleared by the [place]  FDA and has been authorized for detection and/or diagnosis of SARS-CoV-2 by FDA under an Emergency Use Authorization (EUA). This EUA will remain in effect (meaning this test can be used) for the duration of the COVID-19 declaration under Section 564(b)(1) of the Act, 21 U.S.C. section 360bbb-3(b)(1), unless the authorization is terminated or revoked.     Resp Syncytial Virus by PCR NEGATIVE NEGATIVE Final    Comment: (NOTE) Fact Sheet for Patients: bloggercourse.com  Fact Sheet for Healthcare Providers: seriousbroker.it  This test is not yet approved or cleared by the [place]  FDA and has been authorized for detection and/or diagnosis of SARS-CoV-2 by FDA under an Emergency Use Authorization (EUA). This EUA will remain in effect (meaning this test can be used) for the duration of the COVID-19 declaration under Section 564(b)(1) of the Act, 21 U.S.C. section 360bbb-3(b)(1), unless the authorization is terminated or revoked.  Performed at [facility], [address] [place], [place] [address]   MRSA Next Gen by PCR, Nasal     Status:  Abnormal   Collection Time: 06/22/24 11:51 PM   Specimen: Nasal Mucosa; Nasal Swab  Result Value Ref Range Status   MRSA by PCR Next Gen DETECTED (A) NOT DETECTED Final    Comment: RESULT CALLED TO, READ BACK BY AND VERIFIED WITH: ABIGAIL FOWLKE @0215  ON 06/23/24 SKL (NOTE) The GeneXpert MRSA Assay (FDA approved for NASAL specimens only), is one component of a comprehensive MRSA colonization surveillance program. It is not intended to diagnose MRSA infection nor to guide or monitor treatment for MRSA infections. Test performance is not FDA approved in  patients less than 62 years old. Performed at [facility], [address], [place], [place] [address]      Radiology Studies: DG Chest 1 View Result Date: 06/25/2024 CLINICAL DATA:  Pulmonary edema. EXAM: CHEST  1 VIEW COMPARISON:  02/28/2024 and CT chest 02/16/2022. FINDINGS: Right IJ dialysis catheter terminates in the right atrium. Trachea is midline. Heart is enlarged. There may be subtle interstitial prominence bilaterally. Probable left pleural effusion. Findings appear unchanged. IMPRESSION: Probable mild interstitial pulmonary edema and small left pleural effusion. Electronically Signed   By: Newell Eke M.D.   On: 06/25/2024 11:11    Scheduled Meds:  apixaban   5 mg Oral BID   Chlorhexidine  Gluconate Cloth  6 each Topical Q0600   feeding supplement (NEPRO CARB STEADY)  237 mL Oral TID BM   furosemide   60 mg Intravenous Once   levothyroxine   75 mcg Oral Q0600   midodrine   10 mg Oral TID WC   multivitamin  1 tablet Oral QHS   pantoprazole   40 mg Oral BH-q7a   rosuvastatin   20 mg Oral QHS   Continuous Infusions:  sodium thiosulfate  25 g in sodium chloride  0.9 % 200 mL Infusion for Calciphylaxis 25 g (06/26/24 1646)     LOS: 4 days  MDM: Patient is high risk for one or more organ failure.  They necessitate ongoing hospitalization for continued IV therapies and subsequent lab monitoring. Total time spent  interpreting labs and vitals, reviewing the medical record, coordinating care amongst consultants and care team members, directly assessing and discussing care with the patient and/or family: 55 min Laree Lock, MD [facility]  To contact the attending physician between 7A-7P please use Epic Chat. To contact the covering physician during after hours 7P-7A, please review Amion.  06/26/2024, 7:31 PM   *This document has been created with the assistance of dictation software. Please excuse typographical errors. *   "

## 2024-06-26 NOTE — Progress Notes (Signed)
 "                                                                                                                                                                                               Palliative Care Progress Note, Assessment & Plan   Patient Name: [name]       Date: 06/26/2024 DOB: 08/31/1960  Age: 64 y.o. MRN#: [id number] Attending Physician: Jerelene Critchley, MD Primary Care Physician: Orlean Alan HERO, FNP Admit Date: 06/22/2024  Subjective: Patient endorses 9.5/10 chronic left foot pain.  States she is going to have HD later today.  Patient request to go home multiple times during visit.  She denies shortness of breath or chest pain.  She is eating most of her meals.  Denies nausea or vomiting today.  HPI:  64 y.o. female  with past medical history significant for ESRD on HD (M,W,F), COPD, HFrEF (25-30%), bipolar disorder, chronic hypotension on midodrine , HLD and previous CVA. Patient presented to ED 06/22/2024 c/o syncopal episode with possible fall.  Patient reported when she arrived at her HD 1/7, she was todl her BP was too low for dialysis. She reported continuing nausea with multiple episodes of emesis with weakness. She thinks she may have passes out but is not sure what happened.  ED exam found patient to be hypotensive on arrival at 77/44.    ED labs significant for troponin 154-->155. Lactic acid 3.0-->2.0. WBC WNL 5.8, Hgb 13.6. CMP not concerning due to being dialysis patient.    ED vitals 70/41, HR 82, RR 13, SpO2 92% 3L, 97.4 F   CXR  1. Cardiomegaly with vascular congestion without overt edema.   CT Head demonstrated:  1. No acute intracranial abnormality. 2. 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   CT cervical spine showed: 1. No traumatic injury to the cervical spine. 2. Severe erosive endplate changes at C5-C6, chronic but progressive. Differential considerations include chronic osteomyelitis versus severe degenerative changes with bony remodeling.  Correlate with laboratory evaluation to exclude infection.   PCCM was consulted for admission and management of acute on chronic hypoxic respiratory failure, circulatory shock, syncope and collapse, chronic hypotension on midodrine , mildly elevated troponin s/t demand ischemia.    PMT was consulted for assistance with goals of care conversations.     Summary of counseling/coordination of care Chart reviewed:   Labs: Renal function and electrolyte abnormalities expected in dialysis patient.    Latest Ref Rng & Units 06/26/2024    3:58 AM 06/25/2024    3:54 AM 06/24/2024    7:34 AM  CBC  WBC 4.0 - 10.5 K/uL 5.0  4.6  6.8   Hemoglobin 12.0 - 15.0 g/dL 88.3  88.2  88.0   Hematocrit 36.0 - 46.0 % 37.4  36.7  36.8   Platelets 150 - 400 K/uL 110  106  118       Latest Ref Rng & Units 06/26/2024    3:58 AM 06/25/2024    3:54 AM 06/24/2024    7:34 AM  CMP  Glucose 70 - 99 mg/dL 89  88  875   BUN 8 - 23 mg/dL 40  31  25   Creatinine 0.44 - 1.00 mg/dL 2.78  4.05  5.20   Sodium 135 - 145 mmol/L 141  138  137   Potassium 3.5 - 5.1 mmol/L 4.0  4.1  3.6   Chloride 98 - 111 mmol/L 98  97  95   CO2 22 - 32 mmol/L 24  25  25    Calcium  8.9 - 10.3 mg/dL 89.8  9.8  9.9     Vitals: Blood pressure 104/69, pulse (!) 59, temperature (!) 97.4 F (36.3 C), temperature source Oral, resp. rate 19, height 5' 6 (1.676 m), weight 77.2 kg, SpO2 94%.   Progress notes: Reviewed progress notes from TRH, cardiology, nephrology, PT/OT, TOC and nursing staff.  Imaging: No new imaging since yesterday  MAR: No changes made to [facility]  ACP documents: No ACP documentation to review on file  After reviewing the patient's chart and assessing the patient at bedside, I spoke with patient in regards to symptom management and goals of care.   Ill-appearing female sitting upright in recliner with primary RN at bedside.  She is alert and oriented.  She is able to participate in conversation making her wishes known.   Respirations are even and unlabored.  She is in no distress.  Patient expresses again her wish for her brother Fairy and Darin Rockers to make decisions on her behalf should she become confused or unable to make decisions for herself.  Advised that patient needs to complete H POA documentation while she is here to ensure her wishes are followed.  Educated patient if she were to become confused and unable to hide her medical treatment, her daughter would be next of kin legally to make decision.  Ms. Kalis shares that she would rather her brother Fairy make decisions on her behalf.  Patient agreeable to spiritual consult to complete H POA documentation.  Consult for spiritual support placed.  Therapeutic silence and active listening provided for patient to share her thoughts and emotions regarding current medical situation.  Emotional support provided.  Physical Exam Vitals reviewed.  Constitutional:      General: She is not in acute distress.    Appearance: She is ill-appearing.  HENT:     Head: Normocephalic and atraumatic.     Mouth/Throat:     Mouth: Mucous membranes are moist.  Pulmonary:     Effort: Pulmonary effort is normal. No respiratory distress.  Musculoskeletal:     Right lower leg: No edema.     Left lower leg: No edema.     Comments: Bandaged wound to left heel  Skin:    General: Skin is warm and dry.  Neurological:     Mental Status: She is alert.  Psychiatric:        Mood and Affect: Mood normal.        Behavior: Behavior normal.        Thought Content: Thought content normal.        Judgment:  Judgment normal.   Recommendations/Plan: FULL CODE status as previously documented    Continue current supportive interventions Following TOC for disposition   Spiritual consult placed to assist with completion of H POA documentation                  I personally spent a total of 50 minutes in the care of the patient today including preparing to see the patient,  getting/reviewing separately obtained history, performing a medically appropriate exam/evaluation, counseling and educating, placing orders, referring and communicating with other health care professionals, documenting clinical information in the EHR, independently interpreting results, communicating results, and coordinating care.    Devere Sacks, AMANDA [facility] Palliative Medicine Team  06/26/2024 9:58 AM  Office [phone]  Pager [phone]     "

## 2024-06-26 NOTE — Progress Notes (Signed)
 Pt receives outpt HD at [facility] on MWF at 6:00am. Navigator following to assist with any HD needs.  Suzen Satchel Dialysis Navigator [phone]

## 2024-06-26 NOTE — Progress Notes (Signed)
 " [facility] CLINIC CARDIOLOGY CONSULT NOTE       Patient ID: [name] MRN: [id number] DOB/AGE: 62-Dec-1962 62 y.o.  Admit date: 06/22/2024 Referring Physician Dr. Isaiah Primary Physician Orlean Alan HERO, FNP  Primary Cardiologist Dr. Fernand Reason for Consultation Syncope, heart failure  HPI: [name] is a 64 y.o. female  with a past medical history of chronic HFpEF, COPD, ESRD on HD, bipolar disorder, hypotension on midodrine , hx CVA who presented to the ED on 06/22/2024 for syncope. Cardiology was consulted for further evaluation.   Interval history: - Patient seen and examined this morning, sitting upright in hospital bed. - States that she is feeling well overall with no complaints this a.m.  Denies any chest pain, shortness of breath, lower extremity pain. - BP and heart rate remained stable, no events noted on telemetry.  Review of systems complete and found to be negative unless listed above    Past Medical History:  Diagnosis Date   Acute lacunar infarction (HCC) 03/03/2014   a.) MRI brain 03/03/2014: two acute punctate lacunar infarcts anteriorly in the BILATERAL centrum semiovale   Adult behavior problems    Anemia of chronic renal failure    Aortic atherosclerosis    Arthritis    Atrial fibrillation and flutter (HCC)    a.) CHA2DS2VASc = 6 (sex, HFrEF, HTN, CVA x 2, prior MI/vascular disease) as of 02/04/2024; b.) rate/rhythm maintained on oral metoprolol  succinate; chronically anticoagulated with apixaban    Bipolar disorder (HCC)    Cardiomegaly    Cerebral microvascular disease    CHF (congestive heart failure) (HCC)    Chicken pox    Cholelithiasis    Chronic respiratory failure with hypoxia (HCC) 12/19/2021   COPD (chronic obstructive pulmonary disease) (HCC)    Coronary artery disease    DDD (degenerative disc disease), cervical    DDD (degenerative disc disease), thoracolumbar    Diverticulosis    Dyspnea    ESRD (end stage renal disease) on  dialysis (M,W,F)    GERD (gastroesophageal reflux disease)    Gout    HCV (hepatitis C virus)    Headache    HFrEF (heart failure with reduced ejection fraction) (HCC)    History of delirium 04/12/2014   History of substance abuse (HCC)    a.) tobacco + cocaine   Hyperkalemia 11/13/2014   Hyperlipidemia    Hypertension    Hypotension 02/24/2014   Hypothyroidism    Ischemic cardiomyopathy    Nose colonized with MRSA 02/01/2024   a.) presurgical PCR (+) on 02/01/2024 prior to A/V FISTULAGRAM; b.) presurgical PCR (+) on 03/07/2024 prior to AV FISTULA CREATION   NSTEMI (non-ST elevated myocardial infarction) (HCC) 10/08/2013   NSTEMI (non-ST elevated myocardial infarction) (HCC) 01/23/2002   a.) LHC 01/24/2002 --> multi-vessel CAD with IRA being 90% OM2 --> delayed PCI until 01/25/2002 --> 3.0 x 23 mm BX Velocity stent   Obesity    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    On apixaban  therapy    Peripheral vascular disease    Pneumonia    Postoperative anemia due to acute blood loss 02/27/2014   Postoperative cerebrovascular infarction following cardiac surgery 02/21/2014   a.) developed RIGHT upper/lower extremity weakness following cardiac revascularization --> neuro consult and head CT --> Hypodensity noted in the area of the LEFT motor cortex consistent with a component of acute ischemia   Pulmonary hypertension (HCC)    RBBB (right bundle branch block)    Renal osteodystrophy  S/P CABG x 3 02/20/2014   a.) LIMA-LAD, SVG-OM1, SVG-PDA   Stroke (HCC) 05/23/2010   a.) brain MRI 05/23/2010: BILATERAL acute infarcts and old prior infarcts; RIGHT frontal lobe extending into the white matter. Small focus of restricted diffusion in the cortex of the medial LEFT frontoparietal lobe. Periventricular T2 and multiple foci of T2 hyperintensity in the subcortical white matter and increased FLAIR signal in the cortex of the LEFT frontal lobe.   Syncope and collapse 07/25/2020    Past  Surgical History:  Procedure Laterality Date   A/V FISTULAGRAM Left 03/30/2019   Procedure: A/V FISTULAGRAM;  Surgeon: Marea Selinda RAMAN, MD;  Location: [facility] CV [facility];  Service: Cardiovascular;  Laterality: Left;   A/V FISTULAGRAM Left 10/06/2019   Procedure: A/V FISTULAGRAM;  Surgeon: Marea Selinda RAMAN, MD;  Location: [facility];  Service: Cardiovascular;  Laterality: Left;   A/V FISTULAGRAM Left 05/19/2021   Procedure: A/V FISTULAGRAM;  Surgeon: Marea Selinda RAMAN, MD;  Location: [facility];  Service: Cardiovascular;  Laterality: Left;   A/V FISTULAGRAM Left 01/29/2022   Procedure: A/V Fistulagram;  Surgeon: Marea Selinda RAMAN, MD;  Location: [facility];  Service: Cardiovascular;  Laterality: Left;   A/V FISTULAGRAM Left 04/30/2022   Procedure: A/V Fistulagram;  Surgeon: Marea Selinda RAMAN, MD;  Location: [facility];  Service: Cardiovascular;  Laterality: Left;   A/V FISTULAGRAM Left 08/20/2022   Procedure: A/V Fistulagram;  Surgeon: Marea Selinda RAMAN, MD;  Location: [facility];  Service: Cardiovascular;  Laterality: Left;   A/V FISTULAGRAM N/A 12/03/2022   Procedure: A/V Fistulagram;  Surgeon: Marea Selinda RAMAN, MD;  Location: [facility];  Service: Cardiovascular;  Laterality: N/A;   A/V FISTULAGRAM Left 02/11/2023   Procedure: A/V Fistulagram;  Surgeon: Marea Selinda RAMAN, MD;  Location: [facility];  Service: Cardiovascular;  Laterality: Left;   A/V FISTULAGRAM Left 02/07/2024   Procedure: A/V Fistulagram;  Surgeon: Marea Selinda RAMAN, MD;  Location: [facility];  Service: Cardiovascular;  Laterality: Left;   COLONOSCOPY     COLONOSCOPY WITH PROPOFOL  N/A 04/22/2021   Procedure: COLONOSCOPY WITH PROPOFOL ;  Surgeon: Maryruth Ole DASEN, MD;  Location: [facility];  Service: Endoscopy;  Laterality: N/A;   CORONARY ANGIOPLASTY WITH STENT PLACEMENT Left 01/25/2002   Procedure: CORONARY ANGIOPLASTY WITH STENT PLACEMENT; Location: [facility]; Surgeon: Margie Lovelace, MD   CORONARY ARTERY BYPASS GRAFT N/A 02/20/2014   Procedure: CORONARY ARTERY BYPASS GRAFT; Location: [facility]; Surgeon: Reyes Fruits, MD   DIALYSIS FISTULA CREATION     DIALYSIS/PERMA CATHETER INSERTION N/A 04/08/2023   Procedure: DIALYSIS/PERMA CATHETER INSERTION;  Surgeon: Marea Selinda RAMAN, MD;  Location: [facility];  Service: Cardiovascular;  Laterality: N/A;   DIALYSIS/PERMA CATHETER REMOVAL N/A 07/06/2023   Procedure: DIALYSIS/PERMA CATHETER REMOVAL;  Surgeon: [name] Cordella MATSU, MD;  Location: [facility];  Service: Cardiovascular;  Laterality: N/A;   ESOPHAGOGASTRODUODENOSCOPY     ESOPHAGOGASTRODUODENOSCOPY N/A 04/22/2021   Procedure: ESOPHAGOGASTRODUODENOSCOPY (EGD);  Surgeon: Maryruth Ole DASEN, MD;  Location: [facility] ENDOSCOPY;  Service: Endoscopy;  Laterality: N/A;   FLEXIBLE SIGMOIDOSCOPY N/A 02/23/2019   Procedure: FLEXIBLE SIGMOIDOSCOPY;  Surgeon: Gaylyn Gladis PENNER, MD;  Location: [facility] ENDOSCOPY;  Service: Endoscopy;  Laterality: N/A;   IABP INSERTION  02/20/2014   Procedure: INTRA-AORTIC BALLOON PUMP INSERTION; Location: [facility]; Surgeon: Reyes Fruits, MD   INSERTION OF ARTERIOVENOUS (AV) ARTEGRAFT ARM Right 03/09/2024   Procedure: INSERTION, GRAFT, ARTERIOVENOUS, UPPER EXTREMITY;  Surgeon: Marea,  Selinda RAMAN, MD;  Location: [facility];  Service: Vascular;  Laterality: Right;  BRACHIAL AXILLARY   INSERTION OF DIALYSIS CATHETER N/A 04/20/2024   Procedure: INSERTION OF DIALYSIS CATHETER;  Surgeon: Marea Selinda RAMAN, MD;  Location: [facility];  Service: Vascular;  Laterality: N/A;   LEFT HEART CATH AND CORONARY ANGIOGRAPHY Left 01/24/2002   Procedure: LEFT HEART CATH AND CORONARY ANGIOGRAPHY; Location: [facility]; Surgeon: Margie Lovelace, MD   LEFT HEART CATH AND CORONARY ANGIOGRAPHY Left 05/25/2012   Procedure: LEFT HEART CATH AND CORONARY ANGIOGRAPHY; Location: [facility]; Surgeon: Margie Lovelace, MD   LEFT HEART CATH AND CORONARY ANGIOGRAPHY Left 10/09/2013   Procedure: LEFT HEART CATH  AND CORONARY ANGIOGRAPHY; Location: [facility]; Surgeon: Vinie Jude, MD   LEFT HEART CATH AND CORS/GRAFTS ANGIOGRAPHY N/A 08/08/2018   Procedure: LEFT HEART CATH AND CORS/GRAFTS ANGIOGRAPHY;  Surgeon: Fernand Denyse LABOR, MD;  Location: [facility];  Service: Cardiovascular;  Laterality: N/A;   LEFT HEART CATH AND CORS/GRAFTS ANGIOGRAPHY N/A 01/30/2019   Procedure: LEFT HEART CATH AND CORS/GRAFTS ANGIOGRAPHY;  Surgeon: Fernand Denyse LABOR, MD;  Location: [facility];  Service: Cardiovascular;  Laterality: N/A;   LIGATION ARTERIOVENOUS GORTEX GRAFT Right 04/20/2024   Procedure: LIGATION ARTERIOVENOUS GORTEX GRAFT;  Surgeon: Marea Selinda RAMAN, MD;  Location: [facility];  Service: Vascular;  Laterality: Right;   LOWER EXTREMITY ANGIOGRAPHY Left 04/24/2024   Procedure: Lower Extremity Angiography;  Surgeon: Marea Selinda RAMAN, MD;  Location: [facility];  Service: Cardiovascular;  Laterality: Left;   ultrasound guided pericardiocentesis     UPPER EXTREMITY ANGIOGRAPHY Right 03/30/2024   Procedure: Upper Extremity Angiography;  Surgeon: Marea Selinda RAMAN, MD;  Location: [facility];  Service: Cardiovascular;  Laterality: Right;    Medications Prior to Admission  Medication Sig Dispense Refill Last Dose/Taking   albuterol  (VENTOLIN  HFA) 108 (90 Base) MCG/ACT inhaler Inhale 1-2 puffs into the lungs every 6 (six) hours as needed for wheezing or shortness of breath. 20.1 g 3 Taking As Needed   apixaban  (ELIQUIS ) 5 MG TABS tablet Take 5 mg by mouth 2 (two) times daily.   06/22/2024 Morning   clobetasol cream (TEMOVATE) 0.05 % Apply 1 Application topically daily.   Taking   hydrOXYzine  (ATARAX /VISTARIL ) 50 MG tablet Take 50 mg by mouth 2 (two) times daily.   06/22/2024   levothyroxine  (SYNTHROID ) 75 MCG tablet Take 75 mcg by mouth daily before breakfast.    06/22/2024   lidocaine -prilocaine  (EMLA ) cream Apply 1 Application topically once.   Taking   metoprolol  succinate (TOPROL -XL) 25 MG 24 hr tablet Take 1  tablet by mouth every morning.   06/22/2024   midodrine  (PROAMATINE ) 10 MG tablet Take 1 tablet (10 mg total) by mouth 3 (three) times daily. (Patient taking differently: Take 10 mg by mouth 2 (two) times daily.) 90 tablet 0 06/22/2024   oxyCODONE -acetaminophen  (PERCOCET) 5-325 MG tablet Take 1 tablet by mouth 2 (two) times daily as needed for severe pain (pain score 7-10). 20 tablet 0 Taking As Needed   cephALEXin  (KEFLEX ) 500 MG capsule Take 1 capsule (500 mg total) by mouth 2 (two) times daily. (Patient not taking: Reported on 06/23/2024) 14 capsule 0 Not Taking   colchicine  0.6 MG tablet Take 1 tablet (0.6 mg total) by mouth once for 1 dose. (Patient not taking: Reported on 05/23/2024) 1 tablet 0    doxycycline  (VIBRA -TABS) 100 MG tablet Take 1 tablet (100 mg total) by mouth 2 (two) times daily. (Patient not taking: Reported on  05/23/2024) 20 tablet 0 Not Taking   pantoprazole  (PROTONIX ) 40 MG tablet Take 40 mg by mouth every morning. (Patient not taking: Reported on 06/23/2024)   Not Taking   pregabalin (LYRICA) 50 MG capsule Take 50 mg by mouth daily. (Patient not taking: Reported on 06/23/2024)   Not Taking   ranolazine  (RANEXA ) 1000 MG SR tablet Take 1,000 mg by mouth 2 (two) times daily. (Patient not taking: Reported on 06/23/2024)   Not Taking   rosuvastatin  (CRESTOR ) 20 MG tablet Take 20 mg by mouth at bedtime. (Patient not taking: Reported on 06/23/2024)   Not Taking   sevelamer  carbonate (RENVELA ) 800 MG tablet Take 3,200 mg by mouth 3 (three) times daily with meals. (Patient not taking: Reported on 06/23/2024)   Not Taking   triamcinolone  cream (KENALOG ) 0.1 % Apply 1 Application topically 2 (two) times daily. (Patient not taking: Reported on 06/23/2024) 453.6 g 2 Not Taking   venlafaxine (EFFEXOR) 37.5 MG tablet Take 37.5 mg by mouth 2 (two) times daily with a meal. (Patient not taking: Reported on 06/23/2024)   Not Taking   Social History   Socioeconomic History   Marital status: Single    Spouse name:  Not on file   Number of children: Not on file   Years of education: Not on file   Highest education level: Not on file  Occupational History   Not on file  Tobacco Use   Smoking status: Every Day    Current packs/day: 0.25    Average packs/day: 0.3 packs/day for 20.0 years (5.0 ttl pk-yrs)    Types: Cigarettes   Smokeless tobacco: Never   Tobacco comments:    smokes 1 - 1.5 cigarettes a day  Vaping Use   Vaping status: Never Used  Substance and Sexual Activity   Alcohol use: No    Comment: social   Drug use: No   Sexual activity: Not Currently    Birth control/protection: Post-menopausal  Other Topics Concern   Not on file  Social History Narrative   Lives alone   Social Drivers of Health   Tobacco Use: High Risk (06/23/2024)   Patient History    Smoking Tobacco Use: Every Day    Smokeless Tobacco Use: Never    Passive Exposure: Not on file  Financial Resource Strain: Low Risk  (05/31/2024)   Received from [facility]   Overall Financial Resource Strain (CARDIA)    Difficulty of Paying Living Expenses: Not hard at all  Food Insecurity: No Food Insecurity (06/23/2024)   Epic    Worried About Radiation Protection Practitioner of Food in the Last Year: Never true    Ran Out of Food in the Last Year: Never true  Transportation Needs: No Transportation Needs (06/23/2024)   Epic    Lack of Transportation (Medical): No    Lack of Transportation (Non-Medical): No  Physical Activity: Not on file  Stress: Not on file  Social Connections: Not on file  Intimate Partner Violence: Not At Risk (06/23/2024)   Epic    Fear of Current or Ex-Partner: No    Emotionally Abused: No    Physically Abused: No    Sexually Abused: No  Depression (PHQ2-9): Low Risk (07/06/2023)   Depression (PHQ2-9)    PHQ-2 Score: 3  Alcohol Screen: Not on file  Housing: Low Risk (06/23/2024)   Epic    Unable to Pay for Housing in the Last Year: No    Number of Times Moved in the Last Year:  0    Homeless in the  Last Year: No  Utilities: Not At Risk (06/23/2024)   Epic    Threatened with loss of utilities: No  Health Literacy: Not on file    Family History  Problem Relation Age of Onset   Hypertension Mother    Ovarian cancer Mother    Diabetes type II Mother    Hypertension Father    Kidney disease Father    Hypertension Sister    Diabetes type II Maternal Grandmother    Breast cancer Maternal Grandmother    Breast cancer Maternal Aunt    Hypertension Other    Cancer Other    Renal Disease Other      Vitals:   06/26/24 0600 06/26/24 0632 06/26/24 0723 06/26/24 1024  BP:  (!) 89/53 (!) 92/57 99/62  Pulse: (!) 54 (!) 53 (!) 50   Resp: 16 15 (!) 21   Temp:      TempSrc:      SpO2: 98% 95% 92%   Weight:      Height:        PHYSICAL EXAM General: Chronically ill appearing female, well nourished, in no acute distress. HEENT: Normocephalic and atraumatic. Neck: No JVD.  Lungs: Normal respiratory effort on room air. Clear bilaterally to auscultation. No wheezes, crackles, rhonchi.  Heart: HRRR. Normal S1 and S2 without gallops or murmurs.  Abdomen: Non-distended appearing.  Msk: Normal strength and tone for age. Extremities: Warm and well perfused. No clubbing, cyanosis. No edema.  Neuro: Alert and oriented X 3. Psych: Answers questions appropriately.   Labs: Basic Metabolic Panel: Recent Labs    06/25/24 0354 06/26/24 0358  NA 138 141  K 4.1 4.0  CL 97* 98  CO2 25 24  GLUCOSE 88 89  BUN 31* 40*  CREATININE 5.94* 7.21*  CALCIUM  9.8 10.1  MG 2.2 2.1  PHOS 5.7* 6.5*   Liver Function Tests: Recent Labs    06/25/24 0354 06/26/24 0358  ALBUMIN  3.9 4.0   No results for input(s): LIPASE, AMYLASE in the last 72 hours. CBC: Recent Labs    06/25/24 0354 06/26/24 0358  WBC 4.6 5.0  HGB 11.7* 11.6*  HCT 36.7 37.4  MCV 100.0 100.3*  PLT 106* 110*   Cardiac Enzymes: No results for input(s): CKTOTAL, CKMB, CKMBINDEX, TROPONINIHS in the last 72  hours. BNP: No results for input(s): BNP in the last 72 hours. D-Dimer: No results for input(s): DDIMER in the last 72 hours. Hemoglobin A1C: No results for input(s): HGBA1C in the last 72 hours. Fasting Lipid Panel: No results for input(s): CHOL, HDL, LDLCALC, TRIG, CHOLHDL, LDLDIRECT in the last 72 hours. Thyroid  Function Tests: No results for input(s): TSH, T4TOTAL, T3FREE, THYROIDAB in the last 72 hours.  Invalid input(s): FREET3 Anemia Panel: No results for input(s): VITAMINB12, FOLATE, FERRITIN, TIBC, IRON, RETICCTPCT in the last 72 hours.   Radiology: DG Chest 1 View Result Date: 06/25/2024 CLINICAL DATA:  Pulmonary edema. EXAM: CHEST  1 VIEW COMPARISON:  02/28/2024 and CT chest 02/16/2022. FINDINGS: Right IJ dialysis catheter terminates in the right atrium. Trachea is midline. Heart is enlarged. There may be subtle interstitial prominence bilaterally. Probable left pleural effusion. Findings appear unchanged. IMPRESSION: Probable mild interstitial pulmonary edema and small left pleural effusion. Electronically Signed   By: Newell Eke M.D.   On: 06/25/2024 11:11   ECHOCARDIOGRAM COMPLETE Result Date: 06/23/2024    ECHOCARDIOGRAM REPORT   Patient Name:   ZULAY CORRIE Date of Exam: 06/23/2024  Medical Rec #:  [id number]         Height:       66.0 in Accession #:    [id number]        Weight:       164.5 lb Date of Birth:  1960/08/12          BSA:          1.840 m Patient Age:    63 years          BP:           101/73 mmHg Patient Gender: F                 HR:           107 bpm. Exam Location:  [facility] Procedure: 2D Echo, Color Doppler and Cardiac Doppler (Both Spectral and Color            Flow Doppler were utilized during procedure). STAT ECHO Indications:     Syncope R55  History:         Patient has prior history of Echocardiogram examinations, most                  recent 05/17/2023. COPD; Arrythmias:Atrial Fibrillation and                  Atrial  Flutter.  Sonographer:     Christopher Furnace Referring Phys:  [id number] ROBET KIM Diagnosing Phys: Dwayne D Callwood MD IMPRESSIONS  1. Left ventricular ejection fraction, by estimation, is 25 to 30%. The left ventricle has severely decreased function. The left ventricle demonstrates global hypokinesis. The left ventricular internal cavity size was severely dilated. Left ventricular diastolic function could not be evaluated.  2. Right ventricular systolic function is severely reduced. The right ventricular size is severely enlarged. Mildly increased right ventricular wall thickness.  3. Left atrial size was mildly dilated.  4. Right atrial size was severely dilated.  5. The mitral valve is normal in structure. Mild mitral valve regurgitation.  6. Tricuspid valve regurgitation is severe.  7. The aortic valve is grossly normal. Aortic valve regurgitation is not visualized. Aortic valve sclerosis is present, with no evidence of aortic valve stenosis. FINDINGS  Left Ventricle: Left ventricular ejection fraction, by estimation, is 25 to 30%. The left ventricle has severely decreased function. The left ventricle demonstrates global hypokinesis. Strain was performed and the global longitudinal strain is indeterminate. The left ventricular internal cavity size was severely dilated. There is borderline left ventricular hypertrophy. Left ventricular diastolic function could not be evaluated. Right Ventricle: The right ventricular size is severely enlarged. Mildly increased right ventricular wall thickness. Right ventricular systolic function is severely reduced. Left Atrium: Left atrial size was mildly dilated. Right Atrium: Right atrial size was severely dilated. Pericardium: There is no evidence of pericardial effusion. Mitral Valve: The mitral valve is normal in structure. Mild mitral valve regurgitation. Tricuspid Valve: The tricuspid valve is grossly normal. Tricuspid valve regurgitation is severe. Aortic Valve: The aortic  valve is grossly normal. Aortic valve regurgitation is not visualized. Aortic valve sclerosis is present, with no evidence of aortic valve stenosis. Aortic valve mean gradient measures 3.0 mmHg. Aortic valve peak gradient measures 4.4 mmHg. Aortic valve area, by VTI measures 2.08 cm. Pulmonic Valve: The pulmonic valve was not well visualized. Pulmonic valve regurgitation is mild. Aorta: The aortic root was not well visualized. IAS/Shunts: No atrial level shunt detected by color flow Doppler. Additional Comments: 3D was performed not  requiring image post processing on an independent workstation and was indeterminate.  LEFT VENTRICLE PLAX 2D LVIDd:         6.02 cm      Diastology LVIDs:         4.64 cm      LV e' medial:  4.64 cm/s LV PW:         1.11 cm      LV e' lateral: 8.32 cm/s LV IVS:        1.31 cm LVOT diam:     2.00 cm LV SV:         28 LV SV Index:   15 LVOT Area:     3.14 cm  LV Volumes (MOD) LV vol d, MOD A2C: 172.0 ml LV vol d, MOD A4C: 173.0 ml LV vol s, MOD A2C: 168.0 ml LV vol s, MOD A4C: 119.0 ml LV SV MOD A2C:     4.0 ml LV SV MOD A4C:     173.0 ml LV SV MOD BP:      33.8 ml RIGHT VENTRICLE RV Basal diam:  4.52 cm RV Mid diam:    3.20 cm LEFT ATRIUM             Index        RIGHT ATRIUM           Index LA diam:        3.40 cm 1.85 cm/m   RA Area:     28.70 cm LA Vol (A2C):   86.5 ml 47.00 ml/m  RA Volume:   110.00 ml 59.77 ml/m LA Vol (A4C):   67.8 ml 36.84 ml/m LA Biplane Vol: 78.1 ml 42.44 ml/m  AORTIC VALVE AV Area (Vmax):    1.81 cm AV Area (Vmean):   1.64 cm AV Area (VTI):     2.08 cm AV Vmax:           105.00 cm/s AV Vmean:          79.900 cm/s AV VTI:            0.133 m AV Peak Grad:      4.4 mmHg AV Mean Grad:      3.0 mmHg LVOT Vmax:         60.60 cm/s LVOT Vmean:        41.700 cm/s LVOT VTI:          0.088 m LVOT/AV VTI ratio: 0.66  AORTA Ao Root diam: 2.30 cm TRICUSPID VALVE TR Peak grad:   45.7 mmHg TR Vmax:        338.00 cm/s  SHUNTS Systemic VTI:  0.09 m Systemic Diam: 2.00  cm [name] MD Electronically signed by [name] MD Signature Date/Time: 06/23/2024/2:44:50 PM    Final    CT Cervical Spine Wo Contrast Result Date: 06/22/2024 EXAM: CT CERVICAL SPINE WITHOUT CONTRAST 06/22/2024 09:55:46 PM TECHNIQUE: CT of the cervical spine was performed without the administration of intravenous contrast. Multiplanar reformatted images are provided for review. Automated exposure control, iterative reconstruction, and/or weight based adjustment of the mA/kV was utilized to reduce the radiation dose to as low as reasonably achievable. COMPARISON: None available. CLINICAL HISTORY: Polytrauma, blunt. FINDINGS: BONES AND ALIGNMENT: Reversal of the normal mid cervical lordosis, chronic. No acute fracture or traumatic malalignment. DEGENERATIVE CHANGES: Severe erosive endplate changes at C5-C6, chronic but progressive when compared to remote prior. Differential considerations include chronic osteomyelitis versus severe degenerative changes with bony remodeling. SOFT TISSUES:  No prevertebral soft tissue swelling. IMPRESSION: 1. No traumatic injury to the cervical spine. 2. Severe erosive endplate changes at C5-C6, chronic but progressive. Differential considerations include chronic osteomyelitis versus severe degenerative changes with bony remodeling. Correlate with laboratory evaluation to exclude infection. Electronically signed by: Pinkie Pebbles MD MD 06/22/2024 10:07 PM EST RP Workstation: [id number]   CT HEAD WO CONTRAST ( ) Result Date: 06/22/2024 EXAM: CT HEAD WITHOUT CONTRAST 06/22/2024 09:55:46 PM TECHNIQUE: CT of the head was performed without the administration of intravenous contrast. Automated exposure control, iterative reconstruction, and/or weight based adjustment of the mA/kV was utilized to reduce the radiation dose to as low as reasonably achievable. COMPARISON: MRI brain dated 08/16/2023. CLINICAL HISTORY: Polytrauma, blunt. FINDINGS: BRAIN AND VENTRICLES: No  acute hemorrhage. No evidence of acute infarct. Remote infarcts in bilateral frontal lobes and right parietal lobe. Moderate white matter disease. Intracranial atherosclerosis. No hydrocephalus. No extra-axial collection. No mass effect or midline shift. ORBITS: No acute abnormality. SINUSES: No acute abnormality. SOFT TISSUES AND SKULL: No acute soft tissue abnormality. No skull fracture. IMPRESSION: 1. No acute intracranial abnormality. 2. 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 Electronically signed by: Pinkie Pebbles MD MD 06/22/2024 10:03 PM EST RP Workstation: [id number]   DG Chest Portable 1 View Result Date: 06/22/2024 EXAM: 1 VIEW XRAY OF THE CHEST 06/22/2024 09:30:41 PM COMPARISON: 02/28/2024. Prior CAG. CLINICAL HISTORY: weakness FINDINGS: LINES, TUBES AND DEVICES: Right dialysis catheter tip in the right atrium. LUNGS AND PLEURA: Vascular congestion. No overt edema. No focal pulmonary opacity. No pleural effusion. No pneumothorax. HEART AND MEDIASTINUM: Cardiomegaly. BONES AND SOFT TISSUES: No acute osseous abnormality. IMPRESSION: 1. Cardiomegaly with vascular congestion without overt edema. Electronically signed by: Franky Crease MD 06/22/2024 09:35 PM EST RP Workstation: [id number]    ECHO 06/23/2024: 1. Left ventricular ejection fraction, by estimation, is 25 to 30%. The left ventricle has severely decreased function. The left ventricle demonstrates global hypokinesis. The left ventricular internal cavity size was severely dilated. Left ventricular diastolic function could not be evaluated.   2. Right ventricular systolic function is severely reduced. The right ventricular size is severely enlarged. Mildly increased right ventricular  wall thickness.   3. Left atrial size was mildly dilated.   4. Right atrial size was severely dilated.   5. The mitral valve is normal in structure. Mild mitral valve regurgitation.   6. Tricuspid valve regurgitation is severe.   7. The aortic valve is grossly  normal. Aortic valve regurgitation is not visualized. Aortic valve sclerosis is present, with no evidence of aortic  valve stenosis.   TELEMETRY (personally reviewed): sinus rhythm, PVCs rate 50s  EKG (personally reviewed): NSR rate 60 bpm, PVC  Data reviewed by me 06/26/2024: last 24h vitals tele labs imaging I/O ED provider note, admission H&P  Principal Problem:   Syncope    ASSESSMENT AND PLAN:  CHIQUITTA MATTY is a 65 y.o. female  with a past medical history of chronic HFrEF, COPD, ESRD on HD, bipolar disorder, hypotension on midodrine , hx CVA who presented to the ED on 06/22/2024 for syncope. Cardiology was consulted for further evaluation.   # Syncope # Chronic HFrEF # ESRD on HD Patient presented after syncopal episode with associated recent episodes of nausea and vomiting and generalized weakness.  EKG without acute ischemic change and no evidence of been noted on telemetry during admission.  Echo this admission with EF stable at 25-30%, global hypokinesis, severe LV dilation, severe RV enlargement with severe dysfunction, mild MR, severe TR. - Continue  Crestor  20 mg daily. - Continue Eliquis  5 mg twice daily. - Given episode of syncope, patient would benefit from outpatient cardiac monitoring.  Recommend she follow-up with her primary cardiologist for set up of this.   This patient's plan of care was discussed and created with Dr. Florencio and he is in agreement.  Signed: Danita Bloch, PA-C  06/26/2024, 11:01 AM [facility] Card[facility]      "

## 2024-06-26 NOTE — Evaluation (Signed)
 Occupational Therapy Evaluation Patient Details Name: AJAYA CRUTCHFIELD MRN: [id number] DOB: 09/28/1960 Today's Date: 06/26/2024   History of Present Illness   64 y.o. female  with a past medical history of chronic HFpEF, COPD, ESRD on HD, bipolar disorder, hypotension on midodrine , hx CVA who presented to the ED on 06/22/2024 for syncope.     Clinical Impressions Patient presenting with decreased Ind in self care,balance, functional mobility, transfers, endurance, and safety awareness. Patient reports living at home with use of various equipment depending on how she feels for mobility. Pt use medical transport for HD appts. Family assists with IADLs and grocery shopping. She endorses tons of family involvement. Pt performs bed mobility without assistance and stands and transfers with RW with min guard. Pt seated in recliner chair at end of session with call bell and all needed items. Patient will benefit from acute OT to increase overall independence in the areas of ADLs, functional mobility, and safety awareness in order to safely discharge.      If plan is discharge home, recommend the following:   Assist for transportation;Help with stairs or ramp for entrance;A little help with walking and/or transfers;A little help with bathing/dressing/bathroom     Functional Status Assessment   Patient has had a recent decline in their functional status and demonstrates the ability to make significant improvements in function in a reasonable and predictable amount of time.       Precautions/Restrictions   Precautions Precautions: Fall     Mobility Bed Mobility Overal bed mobility: Modified Independent             General bed mobility comments: increased time but no physical assistance    Transfers Overall transfer level: Needs assistance Equipment used: Rolling walker (2 wheels) Transfers: Sit to/from Stand Sit to Stand: Contact guard assist                  Balance  Overall balance assessment: Needs assistance Sitting-balance support: Feet supported Sitting balance-Leahy Scale: Good     Standing balance support: During functional activity, Bilateral upper extremity supported Standing balance-Leahy Scale: Fair                             ADL either performed or assessed with clinical judgement   ADL Overall ADL's : Needs assistance/impaired     Grooming: Wash/dry hands;Wash/dry face;Sitting;Set up;Supervision/safety                   Toilet Transfer: Contact guard assist;Rolling walker (2 wheels) Toilet Transfer Details (indicate cue type and reason): simulated         Functional mobility during ADLs: Contact guard assist;Supervision/safety       Vision Patient Visual Report: No change from baseline              Pertinent Vitals/Pain Pain Assessment Pain Assessment: No/denies pain     Extremity/Trunk Assessment Upper Extremity Assessment Upper Extremity Assessment: Generalized weakness   Lower Extremity Assessment Lower Extremity Assessment: Generalized weakness       Communication Communication Communication: No apparent difficulties   Cognition Arousal: Alert Behavior During Therapy: WFL for tasks assessed/performed Cognition: No apparent impairments                               Following commands: Impaired Following commands impaired: Follows one step commands with increased time     Cueing  General Comments   Cueing Techniques: Verbal cues              Home Living Family/patient expects to be discharged to:: Private residence Living Arrangements: Alone Available Help at Discharge: Family;Available PRN/intermittently Type of Home: House Home Access: Stairs to enter;Ramped entrance Entrance Stairs-Number of Steps: 2 Entrance Stairs-Rails: Can reach both Home Layout: One level     Bathroom Shower/Tub: Sponge bathes at baseline         Home Equipment: Clinical Biochemist (2 wheels);Rollator (4 wheels);Cane - quad;Wheelchair - manual          Prior Functioning/Environment Prior Level of Function : Independent/Modified Independent;Needs assist             Mobility Comments: pt reports ambulation with SPC or RW and sometimes will transfer to wheelchair for mobility depending on how she feels ADLs Comments: Pt endorses Mod I for ADLs, medical transport for HD, and family assist with grocery shopping    OT Problem List: Impaired balance (sitting and/or standing);Decreased strength;Decreased safety awareness;Decreased activity tolerance   OT Treatment/Interventions: Self-care/ADL training;Therapeutic exercise;Patient/family education;Balance training;Therapeutic activities      OT Goals(Current goals can be found in the care plan section)   Acute Rehab OT Goals Patient Stated Goal: to go home OT Goal Formulation: With patient Time For Goal Achievement: 07/10/24 Potential to Achieve Goals: Fair ADL Goals Pt Will Perform Grooming: with modified independence;standing Pt Will Perform Lower Body Dressing: with modified independence;sit to/from stand Pt Will Transfer to Toilet: with modified independence;ambulating Pt Will Perform Toileting - Clothing Manipulation and hygiene: with modified independence;sit to/from stand   OT Frequency:  Min 2X/week       AM-PAC OT 6 Clicks Daily Activity     Outcome Measure Help from another person eating meals?: None Help from another person taking care of personal grooming?: A Little Help from another person toileting, which includes using toliet, bedpan, or urinal?: A Little Help from another person bathing (including washing, rinsing, drying)?: A Little Help from another person to put on and taking off regular upper body clothing?: A Little Help from another person to put on and taking off regular lower body clothing?: A Little 6 Click Score: 19   End of Session Equipment Utilized During Treatment:  Rolling walker (2 wheels) Nurse Communication: Mobility status  Activity Tolerance: Patient tolerated treatment well Patient left: in bed;with call bell/phone within reach;with bed alarm set  OT Visit Diagnosis: Unsteadiness on feet (R26.81);Repeated falls (R29.6);Muscle weakness (generalized) (M62.81)                Time: 1013-1030 OT Time Calculation (min): 17 min Charges:  OT General Charges $OT Visit: 1 Visit OT Evaluation $OT Eval Moderate Complexity: 1 Mod OT Treatments $Self Care/Home Management : 8-22 mins  Izetta Claude, MS, OTR/L , CBIS ascom [phone]  06/26/2024, 3:41 PM

## 2024-06-26 NOTE — Progress Notes (Signed)
 " [facility]  ROUNDING NOTE   Subjective:   [name] is a 64 year old African-American female with past medical histories including CAD with CABG and end-stage renal disease on dialysis.  Patient presents to the emergency department after suffering a syncopal episode.  Patient has been admitted for Syncope [R55] NSTEMI (non-ST elevated myocardial infarction) (HCC) [I21.4] Hypotension, unspecified hypotension type [I95.9]   Patient is known to our clinic and receives outpatient dialysis treatments at [facility] on MWF schedule, supervised by Dr. Dennise.  Patient states she does not remember episode.  States she just awoke on the floor.  Unaware if she hit her head.  Reports she has been missing dialysis treatments due to increased lower extremity pain.  She reports 1 missed treatment every 1 to 2 weeks.  Labs on ED arrival significant for serum bicarb 19, creatinine 6.26 with GFR 7, BUN 35, troponin 154, respiratory panel negative for influenza, COVID-19, and RSV.  MRSA PCR positive.  Chest x-ray shows vascular congestion without edema.  CT head negative.  CT spine shows age-related changes.  Patient placed in ICU receiving pressor support with Levophed  and amiodarone  drip.  Update:  Last hemodialysis treatment was on Friday. Resting comfortably in bed at the moment. Remains off norepinephrine .  Objective:  Vital signs in last 24 hours:  Temp:  [97 F (36.1 C)-97.7 F (36.5 C)] 97.7 F (36.5 C) (01/12 0408) Pulse Rate:  [43-99] 50 (01/12 0723) Resp:  [10-21] 21 (01/12 0723) BP: (73-109)/(38-67) 92/57 (01/12 0723) SpO2:  [86 %-100 %] 92 % (01/12 0723) Weight:  [77.2 kg] 77.2 kg (01/12 0409)  Weight change: 1.9 kg Filed Weights   06/24/24 0500 06/25/24 0500 06/26/24 0409  Weight: 75.9 kg 75.3 kg 77.2 kg    Intake/Output: I/O last 3 completed shifts: In: 600 [P.O.:600] Out: 0    Intake/Output this shift:  No intake/output data  recorded.  Physical Exam: General: NAD, sitting up in bed  Head: Normocephalic, atraumatic. Moist oral mucosal membranes  Eyes: Anicteric  Lungs:  Clear to auscultation  Heart: irregular  Abdomen:  Soft, nontender  Extremities: No peripheral edema.  Dressing over heel ulcer  Neurologic: Awake, alert, conversant  Skin: Warm,dry, no rash  Access: Right chest PermCath, left AVF +thrill +bruit    Basic Metabolic Panel: Recent Labs  Lab 06/22/24 2128 06/23/24 0349 06/23/24 0902 06/24/24 0734 06/25/24 0354 06/26/24 0358  NA 137  --   --  137 138 141  K 4.8  --  3.8 3.6 4.1 4.0  CL 96*  --   --  95* 97* 98  CO2 19*  --   --  25 25 24   GLUCOSE 90  --   --  124* 88 89  BUN 35*  --   --  25* 31* 40*  CREATININE 6.26*  --   --  4.79* 5.94* 7.21*  CALCIUM  9.8  --   --  9.9 9.8 10.1  MG  --  2.0 2.0  --  2.2 2.1  PHOS  --  6.4*  --  4.6 5.7* 6.5*    Liver Function Tests: Recent Labs  Lab 06/22/24 2128 06/24/24 0734 06/25/24 0354 06/26/24 0358  AST 49*  --   --   --   ALT 27  --   --   --   ALKPHOS 85  --   --   --   BILITOT 0.7  --   --   --   PROT 7.4  --   --   --  ALBUMIN  4.0 4.1 3.9 4.0   No results for input(s): LIPASE, AMYLASE in the last 168 hours. No results for input(s): AMMONIA in the last 168 hours.  CBC: Recent Labs  Lab 06/22/24 2128 06/23/24 0349 06/24/24 0734 06/25/24 0354 06/26/24 0358  WBC 5.8 6.2 6.8 4.6 5.0  HGB 13.6 12.5 11.9* 11.7* 11.6*  HCT 43.6 38.8 36.8 36.7 37.4  MCV 102.6* 100.8* 98.4 100.0 100.3*  PLT 116* 125* 118* 106* 110*    Cardiac Enzymes: No results for input(s): CKTOTAL, CKMB, CKMBINDEX, TROPONINI in the last 168 hours.  BNP: Invalid input(s): POCBNP  CBG: Recent Labs  Lab 06/25/24 0757 06/25/24 1119 06/25/24 2334 06/26/24 0341 06/26/24 0735  GLUCAP 83 116* 96 91 86    Microbiology: Results for orders placed or performed during the hospital encounter of 06/22/24  Resp panel by RT-PCR (RSV,  Flu A&B, Covid) Anterior Nasal Swab     Status: None   Collection Time: 06/22/24  9:28 PM   Specimen: Anterior Nasal Swab  Result Value Ref Range Status   SARS Coronavirus 2 by RT PCR NEGATIVE NEGATIVE Final    Comment: (NOTE) SARS-CoV-2 target nucleic acids are NOT DETECTED.  The SARS-CoV-2 RNA is generally detectable in upper respiratory specimens during the acute phase of infection. The lowest concentration of SARS-CoV-2 viral copies this assay can detect is 138 copies/mL. A negative result does not preclude SARS-Cov-2 infection and should not be used as the sole basis for treatment or other patient management decisions. A negative result may occur with  improper specimen collection/handling, submission of specimen other than nasopharyngeal swab, presence of viral mutation(s) within the areas targeted by this assay, and inadequate number of viral copies(<138 copies/mL). A negative result must be combined with clinical observations, patient history, and epidemiological information. The expected result is Negative.  Fact Sheet for Patients:  bloggercourse.com  Fact Sheet for Healthcare Providers:  seriousbroker.it  This test is no t yet approved or cleared by the [place]  FDA and  has been authorized for detection and/or diagnosis of SARS-CoV-2 by FDA under an Emergency Use Authorization (EUA). This EUA will remain  in effect (meaning this test can be used) for the duration of the COVID-19 declaration under Section 564(b)(1) of the Act, 21 U.S.C.section 360bbb-3(b)(1), unless the authorization is terminated  or revoked sooner.       Influenza A by PCR NEGATIVE NEGATIVE Final   Influenza B by PCR NEGATIVE NEGATIVE Final    Comment: (NOTE) The Xpert Xpress SARS-CoV-2/FLU/RSV plus assay is intended as an aid in the diagnosis of influenza from Nasopharyngeal swab specimens and should not be used as a sole basis for  treatment. Nasal washings and aspirates are unacceptable for Xpert Xpress SARS-CoV-2/FLU/RSV testing.  Fact Sheet for Patients: bloggercourse.com  Fact Sheet for Healthcare Providers: seriousbroker.it  This test is not yet approved or cleared by the [place]  FDA and has been authorized for detection and/or diagnosis of SARS-CoV-2 by FDA under an Emergency Use Authorization (EUA). This EUA will remain in effect (meaning this test can be used) for the duration of the COVID-19 declaration under Section 564(b)(1) of the Act, 21 U.S.C. section 360bbb-3(b)(1), unless the authorization is terminated or revoked.     Resp Syncytial Virus by PCR NEGATIVE NEGATIVE Final    Comment: (NOTE) Fact Sheet for Patients: bloggercourse.com  Fact Sheet for Healthcare Providers: seriousbroker.it  This test is not yet approved or cleared by the [place]  FDA and has been authorized for detection and/or  diagnosis of SARS-CoV-2 by FDA under an Emergency Use Authorization (EUA). This EUA will remain in effect (meaning this test can be used) for the duration of the COVID-19 declaration under Section 564(b)(1) of the Act, 21 U.S.C. section 360bbb-3(b)(1), unless the authorization is terminated or revoked.  Performed at [facility], [address] [place], [place] [address]   MRSA Next Gen by PCR, Nasal     Status: Abnormal   Collection Time: 06/22/24 11:51 PM   Specimen: Nasal Mucosa; Nasal Swab  Result Value Ref Range Status   MRSA by PCR Next Gen DETECTED (A) NOT DETECTED Final    Comment: RESULT CALLED TO, READ BACK BY AND VERIFIED WITH: ABIGAIL FOWLKE @0215  ON 06/23/24 SKL (NOTE) The GeneXpert MRSA Assay (FDA approved for NASAL specimens only), is one component of a comprehensive MRSA colonization surveillance program. It is not intended to diagnose MRSA infection nor to  guide or monitor treatment for MRSA infections. Test performance is not FDA approved in patients less than 62 years old. Performed at [facility], [address] [place], [place] [address]     Coagulation Studies: No results for input(s): LABPROT, INR in the last 72 hours.  Urinalysis: No results for input(s): COLORURINE, LABSPEC, PHURINE, GLUCOSEU, HGBUR, BILIRUBINUR, KETONESUR, PROTEINUR, UROBILINOGEN, NITRITE, LEUKOCYTESUR in the last 72 hours.  Invalid input(s): APPERANCEUR    Imaging: DG Chest 1 View Result Date: 06/25/2024 CLINICAL DATA:  Pulmonary edema. EXAM: CHEST  1 VIEW COMPARISON:  02/28/2024 and CT chest 02/16/2022. FINDINGS: Right IJ dialysis catheter terminates in the right atrium. Trachea is midline. Heart is enlarged. There may be subtle interstitial prominence bilaterally. Probable left pleural effusion. Findings appear unchanged. IMPRESSION: Probable mild interstitial pulmonary edema and small left pleural effusion. Electronically Signed   By: Newell Eke M.D.   On: 06/25/2024 11:11     Medications:      apixaban   5 mg Oral BID   Chlorhexidine  Gluconate Cloth  6 each Topical Q0600   feeding supplement (NEPRO CARB STEADY)  237 mL Oral TID BM   furosemide   60 mg Intravenous Once   levothyroxine   75 mcg Oral Q0600   midodrine   10 mg Oral TID WC   multivitamin  1 tablet Oral QHS   oxyCODONE -acetaminophen   1 tablet Oral Once   oxyCODONE -acetaminophen   1 tablet Oral BID   pantoprazole   40 mg Oral BH-q7a   rosuvastatin   20 mg Oral QHS   heparin , HYDROmorphone  (DILAUDID ) injection, ipratropium-albuterol , lidocaine -prilocaine , ondansetron  (ZOFRAN ) IV, pentafluoroprop-tetrafluoroeth, polyethylene glycol, senna  Assessment/ Plan:  [name] is a 64 y.o.  female with past medical histories including CAD with CABG and end-stage renal disease on dialysis.  Patient presents to the emergency department after  suffering a syncopal episode.  Patient has been admitted for Syncope [R55] NSTEMI (non-ST elevated myocardial infarction) (HCC) [I21.4] Hypotension, unspecified hypotension type [I95.9]  [facility]/MWF/right PermCath/72.5 kg  End-stage renal disease on hemodialysis.  Last treatment received on Friday. Continue MWF schedule. Next treatment for Monday.  - orders prepared.   2.  Hypotension with atrial fibrillation: Echo with EF 20-25%. with Syncopal episode with collapse.  Status post amiodarone .  - continue midorine   3. Anemia of chronic kidney disease Lab Results  Component Value Date   HGB 11.6 (L) 06/26/2024    Hemoglobin stable.  No need for ESA's.  4. Secondary Hyperparathyroidism: with outpatient labs: PTH 1577, phosphorus 8.7, calcium  9.3 on 05/22/24.   Lab Results  Component Value Date   PTH 627 (H) 07/26/2020   CALCIUM  10.1 06/26/2024   CAION 0.99 (L) 04/20/2024   PHOS 6.5 (H) 06/26/2024    Continues to have significant issues with bone mineral metabolism parameters.  Also has ulceration overlying her left Achilles tendon.  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  We will discuss with her outpatient team.   LOS: 4 Natanael Saladin 1/12/20268:05 AM  . "

## 2024-06-27 ENCOUNTER — Ambulatory Visit: Admitting: Family

## 2024-06-27 ENCOUNTER — Other Ambulatory Visit: Payer: Self-pay

## 2024-06-27 LAB — CBC
HCT: 35.5 % — ABNORMAL LOW (ref 36.0–46.0)
Hemoglobin: 11.5 g/dL — ABNORMAL LOW (ref 12.0–15.0)
MCH: 32.2 pg (ref 26.0–34.0)
MCHC: 32.4 g/dL (ref 30.0–36.0)
MCV: 99.4 fL (ref 80.0–100.0)
Platelets: 103 K/uL — ABNORMAL LOW (ref 150–400)
RBC: 3.57 MIL/uL — ABNORMAL LOW (ref 3.87–5.11)
RDW: 17.9 % — ABNORMAL HIGH (ref 11.5–15.5)
WBC: 5.7 K/uL (ref 4.0–10.5)
nRBC: 0 % (ref 0.0–0.2)

## 2024-06-27 LAB — BASIC METABOLIC PANEL WITH GFR
Anion gap: 23 — ABNORMAL HIGH (ref 5–15)
Anion gap: 23 — ABNORMAL HIGH (ref 5–15)
BUN: 22 mg/dL (ref 8–23)
BUN: 26 mg/dL — ABNORMAL HIGH (ref 8–23)
CO2: 24 mmol/L (ref 22–32)
CO2: 24 mmol/L (ref 22–32)
Calcium: 10.2 mg/dL (ref 8.9–10.3)
Calcium: 10.6 mg/dL — ABNORMAL HIGH (ref 8.9–10.3)
Chloride: 95 mmol/L — ABNORMAL LOW (ref 98–111)
Chloride: 94 mmol/L — ABNORMAL LOW (ref 98–111)
Creatinine, Ser: 4.85 mg/dL — ABNORMAL HIGH (ref 0.44–1.00)
Creatinine, Ser: 5.25 mg/dL — ABNORMAL HIGH (ref 0.44–1.00)
GFR, Estimated: 10 mL/min — ABNORMAL LOW
GFR, Estimated: 9 mL/min — ABNORMAL LOW
Glucose, Bld: 91 mg/dL (ref 70–99)
Glucose, Bld: 100 mg/dL — ABNORMAL HIGH (ref 70–99)
Potassium: 4 mmol/L (ref 3.5–5.1)
Potassium: 4.2 mmol/L (ref 3.5–5.1)
Sodium: 142 mmol/L (ref 135–145)
Sodium: 140 mmol/L (ref 135–145)

## 2024-06-27 LAB — GLUCOSE, CAPILLARY
Glucose-Capillary: 111 mg/dL — ABNORMAL HIGH (ref 70–99)
Glucose-Capillary: 114 mg/dL — ABNORMAL HIGH (ref 70–99)
Glucose-Capillary: 126 mg/dL — ABNORMAL HIGH (ref 70–99)
Glucose-Capillary: 92 mg/dL (ref 70–99)

## 2024-06-27 LAB — BETA-HYDROXYBUTYRIC ACID: Beta-Hydroxybutyric Acid: 0.12 mmol/L (ref 0.05–0.27)

## 2024-06-27 LAB — LACTIC ACID, PLASMA: Lactic Acid, Venous: 1.2 mmol/L (ref 0.5–1.9)

## 2024-06-27 LAB — TSH: TSH: 3.31 u[IU]/mL (ref 0.350–4.500)

## 2024-06-27 MED ORDER — OXYCODONE-ACETAMINOPHEN 5-325 MG PO TABS
1.0000 | ORAL_TABLET | Freq: Four times a day (QID) | ORAL | 0 refills | Status: AC | PRN
  Filled 2024-06-27: qty 20, 5d supply, fill #0

## 2024-06-27 MED ORDER — HYDROMORPHONE HCL 1 MG/ML IJ SOLN
0.5000 mg | INTRAMUSCULAR | Status: DC | PRN
  Filled 2024-06-27: qty 0.5

## 2024-06-27 MED ORDER — MIDODRINE HCL 10 MG PO TABS
10.0000 mg | ORAL_TABLET | Freq: Three times a day (TID) | ORAL | 0 refills | Status: AC
  Filled 2024-06-27: qty 89, 30d supply, fill #0

## 2024-06-27 MED ORDER — PANTOPRAZOLE SODIUM 40 MG PO TBEC
40.0000 mg | DELAYED_RELEASE_TABLET | ORAL | 1 refills | Status: AC
  Filled 2024-06-27: qty 30, 30d supply, fill #0

## 2024-06-27 NOTE — Progress Notes (Signed)
 " PROGRESS NOTE    [name]  FMW:[id number] DOB: 1960-08-07 DOA: 06/22/2024 PCP: [name] HERO, FNP  Chief Complaint  Patient presents with   Loss of Consciousness   Hypotension    Hospital Course:  [name] is a 64 y.o. female with a pmh of of ESRD on HD (MWF), COPD, CHF, bipolar disorder, chronic hypotension on midodrine , HLD and prior CVA who presented to [facility] ED via EMS following syncopal episode.    History was gathered per chart review Per patient, her normal dialysis schedule is MWF. At her most recent session on Wednesday 01/07 , she was told that her blood pressure was too low to get dialysis.  Patient states prior to arrival she may have lost consciousness, fell to the ground but was unsure if she hit her head. She reported she has been very nauseated with multiple episodes of vomiting prior to arrival and has felt generalized weakness. Per chart review, it is noted she had a history of syncope and collapse in 2023  On arrival to the ED found to be hypotensive, 71/44 received IV fluid bolus, became hypoxic.  Was placed on Levophed  for hypotension.  PCCM was contacted for admission.  Patient slowly improving, able to come off Levophed .  BP borderline soft.  Downgraded to TRH for further management on 01/11  Subjective: Patient was examined at the bedside, reports left foot pain ongoing, controlled with pain meds Seen by nephrology, cardiology -okay for discharge Plan was to discharge patient today Spoke with daughter [name], requesting for PT/OT reevaluation -as patient has trouble with memory and taking medication as she lives by herself Also family unable to pick her up today   Objective: Vitals:   06/27/24 0504 06/27/24 0612 06/27/24 0831 06/27/24 1640  BP: 108/65  107/71 (!) 112/92  Pulse: (!) 42 68 85 (!) 58  Resp: 17  18 18   Temp: 98.3 F (36.8 C)  97.9 F (36.6 C) 97.9 F (36.6 C)  TempSrc:   Oral Oral  SpO2: 98% 97% 99% 92%  Weight: 75.2 kg      Height:        Intake/Output Summary (Last 24 hours) at 06/27/2024 1745 Last data filed at 06/27/2024 1300 Gross per 24 hour  Intake 673.37 ml  Output 1000 ml  Net -326.63 ml   Filed Weights   06/25/24 0500 06/26/24 0409 06/27/24 0504  Weight: 75.3 kg 77.2 kg 75.2 kg    Examination: General: Pleasant African-American female, chronically ill looking, in no acute distress . HEENT: atraumatic, normocephalic, neck supple  Cardiovascular: ventricular rhythm, S1 S2, no r/m/g Pulm: Normal work of breathing, fine crackles bilaterally GI: soft, non-tender, non-distended, no rebound/guarding, bs x 4 Extremities: warm/dry, pulses + 2 R/P, no edema noted Neuro: A&O x 3, follows commands, no focal neuro deficits, PERRL   Assessment & Plan:  Acute on Chronic Hypoxic Respiratory Failure - resolved Suspect due to fluid overload with elevated BNP and interstitial edema on CXR On 2L [place], which is baseline   Circulatory Shock - cardiogenic vs hypovolemic-off norepinephrine  Chronic Hypotension Off Levophed  On midodrine  10 mg TID at home  Chronic HFrEF Syncope and Collapse (Hx of the same in 2023) - unclear etiology ? Suspect syncope and collapse likely due to hypotension, trauma workup negative BNP elevated, CXR with overt edema.  Lactic acidosis resolved Echo shows EF 25 to 30%, severely decreased, LV global hypokinesis.  RV systolic function severely reduced.  Mild MR, severe TR Not on diuresis  as patient is anuric Metoprolol  on hold due to bradycardia Cardiology following, appreciate recs Recommend following up with primary cardiology [name] for outpatient cardiac monitor Telemetry monitoring  Mildly Elevated Troponin CAD s/p, ischemic cardiomyopathy CVA Elevated troponin likely due to demand ischemia On Eliquis , statin  Atrial Fibrillation on Eliquis  Metoprolol  on hold due to HR in 50-60's On Eliquis   ESRD on HD (MWF) Acute on Chronic Renal Failure Anion gap metabolic  acidosis Was given 1 L NS on admission due to hypotension Lactic acid normal, will check TSH, serum ketones Nephrology follow-up for dialysis and volume overload  Calciphylaxis On sodium thiosulfate   Left foot pain Chronic Pain s/t Vascular Ulcers Seen by vascular surgery, appreciate recs Perfusion likely suboptimal, no further workup, Normal angiogram of LLE 2 months ago Limited interventional options per vascular surgeon-discussed with [name] on 01/12 Pain management, requiring Percocet  GERD PPI  COPD Not in exacerbation, DuoNebs as needed  Chronic Thrombocytopenia Monitor platelet count  Incidental findings Severe erosive endplate changes at C5-C6, chronic but progressive.  Differentials include chronic osteomyelitis vs severe degenerative changes with bony remodeling -  Denies pain.  No leukocytosis, no evidence of infection clinically, will monitor  Goals of Care Palliative care following, appreciate recs Full code PT/OT recommend home PT/OT/RN Plan was to discharge patient today, daughter [name] requests reevaluation by PT/OT as patient lives alone and is forgetful and not taking meds appropriately Also family unavailable to pick her up today   DVT prophylaxis: Eliquis    Code Status: Full Code Disposition:  Home with Home PT/OT/RN  Consultants:  Cardiology Nephrology Vascular surgery PCCM  Procedures:  None  Antimicrobials:  Anti-infectives (From admission, onward)    None       Data Reviewed: I have personally reviewed following labs and imaging studies CBC: Recent Labs  Lab 06/23/24 0349 06/24/24 0734 06/25/24 0354 06/26/24 0358 06/27/24 0523  WBC 6.2 6.8 4.6 5.0 5.7  HGB 12.5 11.9* 11.7* 11.6* 11.5*  HCT 38.8 36.8 36.7 37.4 35.5*  MCV 100.8* 98.4 100.0 100.3* 99.4  PLT 125* 118* 106* 110* 103*   Basic Metabolic Panel: Recent Labs  Lab 06/23/24 0349 06/23/24 0902 06/24/24 0734 06/25/24 0354 06/26/24 0358 06/27/24 0523  06/27/24 1431  NA  --   --  137 138 141 142 140  K  --  3.8 3.6 4.1 4.0 4.0 4.2  CL  --   --  95* 97* 98 95* 94*  CO2  --   --  25 25 24 24 24   GLUCOSE  --   --  124* 88 89 91 100*  BUN  --   --  25* 31* 40* 22 26*  CREATININE  --   --  4.79* 5.94* 7.21* 4.85* 5.25*  CALCIUM   --   --  9.9 9.8 10.1 10.2 10.6*  MG 2.0 2.0  --  2.2 2.1  --   --   PHOS 6.4*  --  4.6 5.7* 6.5*  --   --    GFR: Estimated Creatinine Clearance: 11.7 mL/min (A) (by C-G formula based on SCr of 5.25 mg/dL (H)). Liver Function Tests: Recent Labs  Lab 06/22/24 2128 06/24/24 0734 06/25/24 0354 06/26/24 0358  AST 49*  --   --   --   ALT 27  --   --   --   ALKPHOS 85  --   --   --   BILITOT 0.7  --   --   --   PROT 7.4  --   --   --  ALBUMIN  4.0 4.1 3.9 4.0   CBG: Recent Labs  Lab 06/26/24 2030 06/27/24 0037 06/27/24 0427 06/27/24 0839 06/27/24 1200  GLUCAP 106* 111* 114* 126* 92    Recent Results (from the past 240 hours)  Resp panel by RT-PCR (RSV, Flu A&B, Covid) Anterior Nasal Swab     Status: None   Collection Time: 06/22/24  9:28 PM   Specimen: Anterior Nasal Swab  Result Value Ref Range Status   SARS Coronavirus 2 by RT PCR NEGATIVE NEGATIVE Final    Comment: (NOTE) SARS-CoV-2 target nucleic acids are NOT DETECTED.  The SARS-CoV-2 RNA is generally detectable in upper respiratory specimens during the acute phase of infection. The lowest concentration of SARS-CoV-2 viral copies this assay can detect is 138 copies/mL. A negative result does not preclude SARS-Cov-2 infection and should not be used as the sole basis for treatment or other patient management decisions. A negative result may occur with  improper specimen collection/handling, submission of specimen other than nasopharyngeal swab, presence of viral mutation(s) within the areas targeted by this assay, and inadequate number of viral copies(<138 copies/mL). A negative result must be combined with clinical observations, patient  history, and epidemiological information. The expected result is Negative.  Fact Sheet for Patients:  bloggercourse.com  Fact Sheet for Healthcare Providers:  seriousbroker.it  This test is no t yet approved or cleared by the [place]  FDA and  has been authorized for detection and/or diagnosis of SARS-CoV-2 by FDA under an Emergency Use Authorization (EUA). This EUA will remain  in effect (meaning this test can be used) for the duration of the COVID-19 declaration under Section 564(b)(1) of the Act, 21 U.S.C.section 360bbb-3(b)(1), unless the authorization is terminated  or revoked sooner.       Influenza A by PCR NEGATIVE NEGATIVE Final   Influenza B by PCR NEGATIVE NEGATIVE Final    Comment: (NOTE) The Xpert Xpress SARS-CoV-2/FLU/RSV plus assay is intended as an aid in the diagnosis of influenza from Nasopharyngeal swab specimens and should not be used as a sole basis for treatment. Nasal washings and aspirates are unacceptable for Xpert Xpress SARS-CoV-2/FLU/RSV testing.  Fact Sheet for Patients: bloggercourse.com  Fact Sheet for Healthcare Providers: seriousbroker.it  This test is not yet approved or cleared by the [place]  FDA and has been authorized for detection and/or diagnosis of SARS-CoV-2 by FDA under an Emergency Use Authorization (EUA). This EUA will remain in effect (meaning this test can be used) for the duration of the COVID-19 declaration under Section 564(b)(1) of the Act, 21 U.S.C. section 360bbb-3(b)(1), unless the authorization is terminated or revoked.     Resp Syncytial Virus by PCR NEGATIVE NEGATIVE Final    Comment: (NOTE) Fact Sheet for Patients: bloggercourse.com  Fact Sheet for Healthcare Providers: seriousbroker.it  This test is not yet approved or cleared by the [place]  FDA  and has been authorized for detection and/or diagnosis of SARS-CoV-2 by FDA under an Emergency Use Authorization (EUA). This EUA will remain in effect (meaning this test can be used) for the duration of the COVID-19 declaration under Section 564(b)(1) of the Act, 21 U.S.C. section 360bbb-3(b)(1), unless the authorization is terminated or revoked.  Performed at [facility], [address] [place], [place] [address]   MRSA Next Gen by PCR, Nasal     Status: Abnormal   Collection Time: 06/22/24 11:51 PM   Specimen: Nasal Mucosa; Nasal Swab  Result Value Ref Range Status   MRSA by PCR Next Gen DETECTED (  A) NOT DETECTED Final    Comment: RESULT CALLED TO, READ BACK BY AND VERIFIED WITH: ABIGAIL FOWLKE @0215  ON 06/23/24 SKL (NOTE) The GeneXpert MRSA Assay (FDA approved for NASAL specimens only), is one component of a comprehensive MRSA colonization surveillance program. It is not intended to diagnose MRSA infection nor to guide or monitor treatment for MRSA infections. Test performance is not FDA approved in patients less than 39 years old. Performed at [facility], [address] [place], [place] [address]      Radiology Studies: No results found.   Scheduled Meds:  apixaban   5 mg Oral BID   Chlorhexidine  Gluconate Cloth  6 each Topical Q0600   feeding supplement (NEPRO CARB STEADY)  237 mL Oral TID BM   furosemide   60 mg Intravenous Once   levothyroxine   75 mcg Oral Q0600   midodrine   10 mg Oral TID WC   multivitamin  1 tablet Oral QHS   pantoprazole   40 mg Oral BH-q7a   rosuvastatin   20 mg Oral QHS   Continuous Infusions:  sodium thiosulfate  25 g in sodium chloride  0.9 % 200 mL Infusion for Calciphylaxis 25 g (06/26/24 1646)     LOS: 5 days  MDM: Patient is high risk for one or more organ failure.  They necessitate ongoing hospitalization for continued IV therapies and subsequent lab monitoring. Total time spent interpreting labs and vitals,  reviewing the medical record, coordinating care amongst consultants and care team members, directly assessing and discussing care with the patient and/or family: 55 min Laree Lock, MD [facility]  To contact the attending physician between 7A-7P please use Epic Chat. To contact the covering physician during after hours 7P-7A, please review Amion.  06/27/2024, 5:45 PM   *This document has been created with the assistance of dictation software. Please excuse typographical errors. *   "

## 2024-06-27 NOTE — Progress Notes (Signed)
 " [facility]  ROUNDING NOTE   Subjective:   [name] is a 64 year old African-American female with past medical histories including CAD with CABG and end-stage renal disease on dialysis.  Patient presents to the emergency department after suffering a syncopal episode.  Patient has been admitted for Syncope [R55] NSTEMI (non-ST elevated myocardial infarction) (HCC) [I21.4] Hypotension, unspecified hypotension type [I95.9]   Patient is known to our clinic and receives outpatient dialysis treatments at [facility] on MWF schedule, supervised by Dr. Dennise.  Patient states she does not remember episode.  States she just awoke on the floor.  Unaware if she hit her head.  Reports she has been missing dialysis treatments due to increased lower extremity pain.  She reports 1 missed treatment every 1 to 2 weeks.  Labs on ED arrival significant for serum bicarb 19, creatinine 6.26 with GFR 7, BUN 35, troponin 154, respiratory panel negative for influenza, COVID-19, and RSV.  MRSA PCR positive.  Chest x-ray shows vascular congestion without edema.  CT head negative.  CT spine shows age-related changes.  Patient placed in ICU receiving pressor support with Levophed  and amiodarone  drip.  Update:  Patient has transferred out of ICU Sitting at side of bed Fully alert and oriented Requesting discharge   Objective:  Vital signs in last 24 hours:  Temp:  [97.4 F (36.3 C)-99 F (37.2 C)] 97.9 F (36.6 C) (01/13 0831) Pulse Rate:  [42-85] 85 (01/13 0831) Resp:  [10-22] 18 (01/13 0831) BP: (87-118)/(59-75) 107/71 (01/13 0831) SpO2:  [92 %-100 %] 99 % (01/13 0831) Weight:  [75.2 kg] 75.2 kg (01/13 0504)  Weight change: -2.039 kg Filed Weights   06/25/24 0500 06/26/24 0409 06/27/24 0504  Weight: 75.3 kg 77.2 kg 75.2 kg    Intake/Output: I/O last 3 completed shifts: In: 673.4 [P.O.:480; IV Piggyback:193.4] Out: 1000 [Other:1000]   Intake/Output this shift:   Total I/O In: 240 [P.O.:240] Out: -   Physical Exam: General: NAD, sitting at bedside  Head: Normocephalic, atraumatic. Moist oral mucosal membranes  Eyes: Anicteric  Lungs:  Clear to auscultation  Heart: irregular  Abdomen:  Soft, nontender  Extremities: No peripheral edema.  Dressing over heel ulcer  Neurologic: Awake, alert, conversant  Skin: Warm,dry, no rash, RLE wound  Access: Right chest PermCath, left AVF +thrill +bruit    Basic Metabolic Panel: Recent Labs  Lab 06/22/24 2128 06/23/24 0349 06/23/24 0902 06/24/24 0734 06/25/24 0354 06/26/24 0358 06/27/24 0523  NA 137  --   --  137 138 141 142  K 4.8  --  3.8 3.6 4.1 4.0 4.0  CL 96*  --   --  95* 97* 98 95*  CO2 19*  --   --  25 25 24 24   GLUCOSE 90  --   --  124* 88 89 91  BUN 35*  --   --  25* 31* 40* 22  CREATININE 6.26*  --   --  4.79* 5.94* 7.21* 4.85*  CALCIUM  9.8  --   --  9.9 9.8 10.1 10.2  MG  --  2.0 2.0  --  2.2 2.1  --   PHOS  --  6.4*  --  4.6 5.7* 6.5*  --     Liver Function Tests: Recent Labs  Lab 06/22/24 2128 06/24/24 0734 06/25/24 0354 06/26/24 0358  AST 49*  --   --   --   ALT 27  --   --   --   CICERO  85  --   --   --   BILITOT 0.7  --   --   --   PROT 7.4  --   --   --   ALBUMIN  4.0 4.1 3.9 4.0   No results for input(s): LIPASE, AMYLASE in the last 168 hours. No results for input(s): AMMONIA in the last 168 hours.  CBC: Recent Labs  Lab 06/23/24 0349 06/24/24 0734 06/25/24 0354 06/26/24 0358 06/27/24 0523  WBC 6.2 6.8 4.6 5.0 5.7  HGB 12.5 11.9* 11.7* 11.6* 11.5*  HCT 38.8 36.8 36.7 37.4 35.5*  MCV 100.8* 98.4 100.0 100.3* 99.4  PLT 125* 118* 106* 110* 103*    Cardiac Enzymes: No results for input(s): CKTOTAL, CKMB, CKMBINDEX, TROPONINI in the last 168 hours.  BNP: Invalid input(s): POCBNP  CBG: Recent Labs  Lab 06/26/24 1137 06/26/24 2030 06/27/24 0037 06/27/24 0427 06/27/24 0839  GLUCAP 103* 106* 111* 114* 126*     Microbiology: Results for orders placed or performed during the hospital encounter of 06/22/24  Resp panel by RT-PCR (RSV, Flu A&B, Covid) Anterior Nasal Swab     Status: None   Collection Time: 06/22/24  9:28 PM   Specimen: Anterior Nasal Swab  Result Value Ref Range Status   SARS Coronavirus 2 by RT PCR NEGATIVE NEGATIVE Final    Comment: (NOTE) SARS-CoV-2 target nucleic acids are NOT DETECTED.  The SARS-CoV-2 RNA is generally detectable in upper respiratory specimens during the acute phase of infection. The lowest concentration of SARS-CoV-2 viral copies this assay can detect is 138 copies/mL. A negative result does not preclude SARS-Cov-2 infection and should not be used as the sole basis for treatment or other patient management decisions. A negative result may occur with  improper specimen collection/handling, submission of specimen other than nasopharyngeal swab, presence of viral mutation(s) within the areas targeted by this assay, and inadequate number of viral copies(<138 copies/mL). A negative result must be combined with clinical observations, patient history, and epidemiological information. The expected result is Negative.  Fact Sheet for Patients:  bloggercourse.com  Fact Sheet for Healthcare Providers:  seriousbroker.it  This test is no t yet approved or cleared by the [place]  FDA and  has been authorized for detection and/or diagnosis of SARS-CoV-2 by FDA under an Emergency Use Authorization (EUA). This EUA will remain  in effect (meaning this test can be used) for the duration of the COVID-19 declaration under Section 564(b)(1) of the Act, 21 U.S.C.section 360bbb-3(b)(1), unless the authorization is terminated  or revoked sooner.       Influenza A by PCR NEGATIVE NEGATIVE Final   Influenza B by PCR NEGATIVE NEGATIVE Final    Comment: (NOTE) The Xpert Xpress SARS-CoV-2/FLU/RSV plus assay is  intended as an aid in the diagnosis of influenza from Nasopharyngeal swab specimens and should not be used as a sole basis for treatment. Nasal washings and aspirates are unacceptable for Xpert Xpress SARS-CoV-2/FLU/RSV testing.  Fact Sheet for Patients: bloggercourse.com  Fact Sheet for Healthcare Providers: seriousbroker.it  This test is not yet approved or cleared by the [place]  FDA and has been authorized for detection and/or diagnosis of SARS-CoV-2 by FDA under an Emergency Use Authorization (EUA). This EUA will remain in effect (meaning this test can be used) for the duration of the COVID-19 declaration under Section 564(b)(1) of the Act, 21 U.S.C. section 360bbb-3(b)(1), unless the authorization is terminated or revoked.     Resp Syncytial Virus by PCR NEGATIVE NEGATIVE Final  Comment: (NOTE) Fact Sheet for Patients: bloggercourse.com  Fact Sheet for Healthcare Providers: seriousbroker.it  This test is not yet approved or cleared by the [place]  FDA and has been authorized for detection and/or diagnosis of SARS-CoV-2 by FDA under an Emergency Use Authorization (EUA). This EUA will remain in effect (meaning this test can be used) for the duration of the COVID-19 declaration under Section 564(b)(1) of the Act, 21 U.S.C. section 360bbb-3(b)(1), unless the authorization is terminated or revoked.  Performed at [facility], [address] [place], [place] [address]   MRSA Next Gen by PCR, Nasal     Status: Abnormal   Collection Time: 06/22/24 11:51 PM   Specimen: Nasal Mucosa; Nasal Swab  Result Value Ref Range Status   MRSA by PCR Next Gen DETECTED (A) NOT DETECTED Final    Comment: RESULT CALLED TO, READ BACK BY AND VERIFIED WITH: ABIGAIL FOWLKE @0215  ON 06/23/24 SKL (NOTE) The GeneXpert MRSA Assay (FDA approved for NASAL specimens only), is one  component of a comprehensive MRSA colonization surveillance program. It is not intended to diagnose MRSA infection nor to guide or monitor treatment for MRSA infections. Test performance is not FDA approved in patients less than 45 years old. Performed at [facility], [address] [place], [place] [address]     Coagulation Studies: No results for input(s): LABPROT, INR in the last 72 hours.  Urinalysis: No results for input(s): COLORURINE, LABSPEC, PHURINE, GLUCOSEU, HGBUR, BILIRUBINUR, KETONESUR, PROTEINUR, UROBILINOGEN, NITRITE, LEUKOCYTESUR in the last 72 hours.  Invalid input(s): APPERANCEUR    Imaging: No results found.    Medications:    sodium thiosulfate  25 g in sodium chloride  0.9 % 200 mL Infusion for Calciphylaxis 25 g (06/26/24 1646)     apixaban   5 mg Oral BID   Chlorhexidine  Gluconate Cloth  6 each Topical Q0600   feeding supplement (NEPRO CARB STEADY)  237 mL Oral TID BM   furosemide   60 mg Intravenous Once   levothyroxine   75 mcg Oral Q0600   midodrine   10 mg Oral TID WC   multivitamin  1 tablet Oral QHS   pantoprazole   40 mg Oral BH-q7a   rosuvastatin   20 mg Oral QHS   HYDROmorphone  (DILAUDID ) injection, ipratropium-albuterol , lidocaine -prilocaine , ondansetron  (ZOFRAN ) IV, oxyCODONE -acetaminophen , pentafluoroprop-tetrafluoroeth, polyethylene glycol, senna  Assessment/ Plan:  [name] is a 64 y.o.  female with past medical histories including CAD with CABG and end-stage renal disease on dialysis.  Patient presents to the emergency department after suffering a syncopal episode.  Patient has been admitted for Syncope [R55] NSTEMI (non-ST elevated myocardial infarction) (HCC) [I21.4] Hypotension, unspecified hypotension type [I95.9]  [facility]/MWF/right PermCath/72.5 kg  End-stage renal disease on hemodialysis.  Received treatment yesterday, 1L achieved. Next treatment scheduled for  Wednesday  2.  Hypotension with atrial fibrillation: Echo with EF 20-25%. with Syncopal episode with collapse.  Status post amiodarone .  - continue midorine   3. Anemia of chronic kidney disease Lab Results  Component Value Date   HGB 11.5 (L) 06/27/2024    Hemoglobin acceptable  4. Secondary Hyperparathyroidism: with outpatient labs: PTH 1577, phosphorus 8.7, calcium  9.3 on 05/22/24. Suspected calciphylaxis   Lab Results  Component Value Date   PTH 627 (H) 07/26/2020   CALCIUM  10.2 06/27/2024   CAION 0.99 (L) 04/20/2024   PHOS 6.5 (H) 06/26/2024    Continues to have significant issues with bone mineral metabolism parameters.  Also has ulceration overlying her left Achilles tendon.  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  Sodium thiosulfate  ordered with dialysis.    LOS: 5 Nuvia Hileman 1/13/202611:18 AM  . "

## 2024-06-27 NOTE — Progress Notes (Signed)
 Patient was given verbal and written discharge instructions, she acknowledges understanding and states she will comply. Patient brother will transport her home after 8 pm

## 2024-06-27 NOTE — TOC Progression Note (Signed)
 Transition of Care [facility]) - Progression Note    Patient Details  Name: [name] MRN: [id number] Date of Birth: December 22, 1960  [facility] CM/SW Contact  Alvaro Louder, [place] Phone Number: 06/27/2024, 11:44 AM  Clinical Narrative:   Patient is active with [facility] and will DC with [facility] PT OT and RN.   TOC to follow for discharge      Barriers to Discharge: Continued Medical Work up               Expected Discharge Plan and Services                                               Social Drivers of Health (SDOH) Interventions SDOH Screenings   Food Insecurity: No Food Insecurity (06/23/2024)  Housing: Low Risk (06/23/2024)  Transportation Needs: No Transportation Needs (06/23/2024)  Utilities: Not At Risk (06/23/2024)  Depression (PHQ2-9): Low Risk (07/06/2023)  Financial Resource Strain: Low Risk  (05/31/2024)   Received from [facility]  Tobacco Use: High Risk (06/23/2024)    Readmission Risk Interventions    06/26/2024   12:20 PM 12/23/2021   11:41 AM  Readmission Risk Prevention Plan  Transportation Screening Complete Complete  PCP or Specialist Appt within 3-5 Days Complete   HRI or Home Care Consult Complete Complete  Social Work Consult for Recovery Care Planning/Counseling Complete Complete  Palliative Care Screening Not Applicable Not Applicable  Medication Review Oceanographer) Complete Complete

## 2024-06-27 NOTE — Evaluation (Signed)
 Physical Therapy Evaluation Patient Details Name: [name] MRN: [id number] DOB: 02-09-1961 Today's Date: 06/27/2024  History of Present Illness  64 y.o. female  with a past medical history of chronic HFpEF, COPD, ESRD on HD, bipolar disorder, hypotension on midodrine , hx CVA who presented to the ED on 06/22/2024 for syncope.  Clinical Impression  Patient received sitting up on side of bed finishing breakfast. She is motivated to ambulate so that she can go home. Patient is able to stand with supervision. She ambulated 125 feet with RW. Slow, steady pace. Flexed posture. She has wounds on L foot causing some pain during mobility. No reports of dizziness. Patient will continue to benefit from skilled PT to improve functional independence and safety.           If plan is discharge home, recommend the following: A little help with walking and/or transfers;A little help with bathing/dressing/bathroom;Assist for transportation;Help with stairs or ramp for entrance   Can travel by private vehicle    yes    Equipment Recommendations None recommended by PT  Recommendations for Other Services       Functional Status Assessment Patient has had a recent decline in their functional status and demonstrates the ability to make significant improvements in function in a reasonable and predictable amount of time.     Precautions / Restrictions Precautions Precautions: Fall Recall of Precautions/Restrictions: Intact Precaution/Restrictions Comments: has wounds on left foot Restrictions Weight Bearing Restrictions Per Provider Order: No      Mobility  Bed Mobility Overal bed mobility: Independent                  Transfers Overall transfer level: Needs assistance Equipment used: Rolling walker (2 wheels) Transfers: Sit to/from Stand Sit to Stand: Supervision                Ambulation/Gait Ambulation/Gait assistance: Supervision Gait Distance (Feet): 125 Feet Assistive  device: Rolling walker (2 wheels) Gait Pattern/deviations: Trunk flexed, Step-to pattern, Decreased step length - right, Decreased step length - left, Decreased stride length Gait velocity: decr     General Gait Details: ambulates with slow pace, flexed posture, forward head. no lob.  Stairs            Wheelchair Mobility     Tilt Bed    Modified Rankin (Stroke Patients Only)       Balance Overall balance assessment: Modified Independent Sitting-balance support: Feet supported Sitting balance-Leahy Scale: Normal     Standing balance support: Bilateral upper extremity supported, During functional activity, Reliant on assistive device for balance Standing balance-Leahy Scale: Good Standing balance comment: able to stand at sink and brush teeth without asistance other than supervision.                             Pertinent Vitals/Pain Pain Assessment Pain Assessment: Faces Faces Pain Scale: Hurts a little bit Pain Location: L foot Pain Descriptors / Indicators: Discomfort, Sore Pain Intervention(s): Monitored during session, Repositioned    Home Living Family/patient expects to be discharged to:: Private residence Living Arrangements: Alone Available Help at Discharge: Family;Available PRN/intermittently Type of Home: House Home Access: Stairs to enter;Ramped entrance Entrance Stairs-Rails: Right;Left;Can reach both Entrance Stairs-Number of Steps: 2   Home Layout: One level Home Equipment: [employer]ant (2 wheels);Rollator (4 wheels);Cane - quad;Wheelchair - manual      Prior Function Prior Level of Function : Independent/Modified Independent;Needs assist  Mobility Comments: pt reports ambulation with SPC or RW and sometimes will transfer to wheelchair for mobility depending on how she feels ADLs Comments: Pt endorses Mod I for ADLs, medical transport for HD, and family assist with grocery shopping     Extremity/Trunk  Assessment   Upper Extremity Assessment Upper Extremity Assessment: Defer to OT evaluation    Lower Extremity Assessment Lower Extremity Assessment: Generalized weakness;LLE deficits/detail LLE Deficits / Details: L foot pain due to wounds    Cervical / Trunk Assessment Cervical / Trunk Assessment: Normal  Communication   Communication Communication: No apparent difficulties    Cognition Arousal: Alert Behavior During Therapy: WFL for tasks assessed/performed   PT - Cognitive impairments: No apparent impairments                         Following commands: Impaired Following commands impaired: Follows one step commands with increased time     Cueing Cueing Techniques: Verbal cues     General Comments      Exercises     Assessment/Plan    PT Assessment Patient needs continued PT services  PT Problem List Decreased strength;Decreased activity tolerance;Decreased mobility;Pain;Decreased skin integrity       PT Treatment Interventions DME instruction;Gait training;Stair training;Therapeutic activities;Functional mobility training;Therapeutic exercise;Patient/family education;Neuromuscular re-education    PT Goals (Current goals can be found in the Care Plan section)  Acute Rehab PT Goals Patient Stated Goal: return home PT Goal Formulation: With patient Time For Goal Achievement: 07/01/24 Potential to Achieve Goals: Good    Frequency Min 2X/week     Co-evaluation               AM-PAC PT 6 Clicks Mobility  Outcome Measure Help needed turning from your back to your side while in a flat bed without using bedrails?: None Help needed moving from lying on your back to sitting on the side of a flat bed without using bedrails?: None Help needed moving to and from a bed to a chair (including a wheelchair)?: None Help needed standing up from a chair using your arms (e.g., wheelchair or bedside chair)?: A Little Help needed to walk in hospital room?: A  Little Help needed climbing 3-5 steps with a railing? : A Little 6 Click Score: 21    End of Session   Activity Tolerance: Patient tolerated treatment well Patient left: in bed;with call bell/phone within reach;with bed alarm set Nurse Communication: Mobility status PT Visit Diagnosis: Other abnormalities of gait and mobility (R26.89);Pain;Difficulty in walking, not elsewhere classified (R26.2) Pain - Right/Left: Left Pain - part of body: Ankle and joints of foot    Time: 9082-9052 PT Time Calculation (min) (ACUTE ONLY): 30 min   Charges:   PT Evaluation $PT Eval Low Complexity: 1 Low PT Treatments $Gait Training: 8-22 mins PT General Charges $$ ACUTE PT VISIT: 1 Visit         Reynold Mantell, PT, GCS 06/27/2024,10:41 AM

## 2024-06-27 NOTE — Progress Notes (Signed)
 " [facility] CLINIC CARDIOLOGY PROGRESS NOTE       Patient ID: [name] MRN: [id number] DOB/AGE: 1960-07-28 64 y.o.  Admit date: 06/22/2024 Referring Physician Dr. Isaiah Primary Physician Orlean Alan HERO, FNP  Primary Cardiologist Dr. Fernand Reason for Consultation Syncope, heart failure  HPI: [name] is a 64 y.o. female  with a past medical history of chronic HFpEF, COPD, ESRD on HD, bipolar disorder, hypotension on midodrine , hx CVA who presented to the ED on 06/22/2024 for syncope. Cardiology was consulted for further evaluation.   Interval history: - Patient seen and examined this morning, sitting upright on side of bed eating breakfast. - Reports feeling well today, no CP or SOB. States her foot hurts but this is stable.  - BP and heart rate remain stable, no events noted on telemetry.  Review of systems complete and found to be negative unless listed above    Past Medical History:  Diagnosis Date   Acute lacunar infarction (HCC) 03/03/2014   a.) MRI brain 03/03/2014: two acute punctate lacunar infarcts anteriorly in the BILATERAL centrum semiovale   Adult behavior problems    Anemia of chronic renal failure    Aortic atherosclerosis    Arthritis    Atrial fibrillation and flutter (HCC)    a.) CHA2DS2VASc = 6 (sex, HFrEF, HTN, CVA x 2, prior MI/vascular disease) as of 02/04/2024; b.) rate/rhythm maintained on oral metoprolol  succinate; chronically anticoagulated with apixaban    Bipolar disorder (HCC)    Cardiomegaly    Cerebral microvascular disease    CHF (congestive heart failure) (HCC)    Chicken pox    Cholelithiasis    Chronic respiratory failure with hypoxia (HCC) 12/19/2021   COPD (chronic obstructive pulmonary disease) (HCC)    Coronary artery disease    DDD (degenerative disc disease), cervical    DDD (degenerative disc disease), thoracolumbar    Diverticulosis    Dyspnea    ESRD (end stage renal disease) on dialysis (M,W,F)    GERD  (gastroesophageal reflux disease)    Gout    HCV (hepatitis C virus)    Headache    HFrEF (heart failure with reduced ejection fraction) (HCC)    History of delirium 04/12/2014   History of substance abuse (HCC)    a.) tobacco + cocaine   Hyperkalemia 11/13/2014   Hyperlipidemia    Hypertension    Hypotension 02/24/2014   Hypothyroidism    Ischemic cardiomyopathy    Nose colonized with MRSA 02/01/2024   a.) presurgical PCR (+) on 02/01/2024 prior to A/V FISTULAGRAM; b.) presurgical PCR (+) on 03/07/2024 prior to AV FISTULA CREATION   NSTEMI (non-ST elevated myocardial infarction) (HCC) 10/08/2013   NSTEMI (non-ST elevated myocardial infarction) (HCC) 01/23/2002   a.) LHC 01/24/2002 --> multi-vessel CAD with IRA being 90% OM2 --> delayed PCI until 01/25/2002 --> 3.0 x 23 mm BX Velocity stent   Obesity    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    On apixaban  therapy    Peripheral vascular disease    Pneumonia    Postoperative anemia due to acute blood loss 02/27/2014   Postoperative cerebrovascular infarction following cardiac surgery 02/21/2014   a.) developed RIGHT upper/lower extremity weakness following cardiac revascularization --> neuro consult and head CT --> Hypodensity noted in the area of the LEFT motor cortex consistent with a component of acute ischemia   Pulmonary hypertension (HCC)    RBBB (right bundle branch block)    Renal osteodystrophy    S/P CABG  x 3 02/20/2014   a.) LIMA-LAD, SVG-OM1, SVG-PDA   Stroke (HCC) 05/23/2010   a.) brain MRI 05/23/2010: BILATERAL acute infarcts and old prior infarcts; RIGHT frontal lobe extending into the white matter. Small focus of restricted diffusion in the cortex of the medial LEFT frontoparietal lobe. Periventricular T2 and multiple foci of T2 hyperintensity in the subcortical white matter and increased FLAIR signal in the cortex of the LEFT frontal lobe.   Syncope and collapse 07/25/2020    Past Surgical History:   Procedure Laterality Date   A/V FISTULAGRAM Left 03/30/2019   Procedure: A/V FISTULAGRAM;  Surgeon: Marea Selinda RAMAN, MD;  Location: [facility];  Service: Cardiovascular;  Laterality: Left;   A/V FISTULAGRAM Left 10/06/2019   Procedure: A/V FISTULAGRAM;  Surgeon: Marea Selinda RAMAN, MD;  Location: [facility];  Service: Cardiovascular;  Laterality: Left;   A/V FISTULAGRAM Left 05/19/2021   Procedure: A/V FISTULAGRAM;  Surgeon: Marea Selinda RAMAN, MD;  Location: [facility];  Service: Cardiovascular;  Laterality: Left;   A/V FISTULAGRAM Left 01/29/2022   Procedure: A/V Fistulagram;  Surgeon: Marea Selinda RAMAN, MD;  Location: [facility];  Service: Cardiovascular;  Laterality: Left;   A/V FISTULAGRAM Left 04/30/2022   Procedure: A/V Fistulagram;  Surgeon: Marea Selinda RAMAN, MD;  Location: [facility];  Service: Cardiovascular;  Laterality: Left;   A/V FISTULAGRAM Left 08/20/2022   Procedure: A/V Fistulagram;  Surgeon: Marea Selinda RAMAN, MD;  Location: [facility];  Service: Cardiovascular;  Laterality: Left;   A/V FISTULAGRAM N/A 12/03/2022   Procedure: A/V Fistulagram;  Surgeon: Marea Selinda RAMAN, MD;  Location: [facility];  Service: Cardiovascular;  Laterality: N/A;   A/V FISTULAGRAM Left 02/11/2023   Procedure: A/V Fistulagram;  Surgeon: Marea Selinda RAMAN, MD;  Location: [facility];  Service: Cardiovascular;  Laterality: Left;   A/V FISTULAGRAM Left 02/07/2024   Procedure: A/V Fistulagram;  Surgeon: Marea Selinda RAMAN, MD;  Location: [facility];  Service: Cardiovascular;  Laterality: Left;   COLONOSCOPY     COLONOSCOPY WITH PROPOFOL  N/A 04/22/2021   Procedure: COLONOSCOPY WITH PROPOFOL ;  Surgeon: Maryruth Ole DASEN, MD;  Location: [facility];  Service: Endoscopy;  Laterality: N/A;   CORONARY ANGIOPLASTY WITH STENT PLACEMENT Left 01/25/2002   Procedure: CORONARY ANGIOPLASTY WITH STENT PLACEMENT; Location: [facility]; Surgeon: Margie Lovelace, MD    CORONARY ARTERY BYPASS GRAFT N/A 02/20/2014   Procedure: CORONARY ARTERY BYPASS GRAFT; Location: [facility]; Surgeon: Reyes Fruits, MD   DIALYSIS FISTULA CREATION     DIALYSIS/PERMA CATHETER INSERTION N/A 04/08/2023   Procedure: DIALYSIS/PERMA CATHETER INSERTION;  Surgeon: Marea Selinda RAMAN, MD;  Location: [facility];  Service: Cardiovascular;  Laterality: N/A;   DIALYSIS/PERMA CATHETER REMOVAL N/A 07/06/2023   Procedure: DIALYSIS/PERMA CATHETER REMOVAL;  Surgeon: [name] Cordella MATSU, MD;  Location: [facility];  Service: Cardiovascular;  Laterality: N/A;   ESOPHAGOGASTRODUODENOSCOPY     ESOPHAGOGASTRODUODENOSCOPY N/A 04/22/2021   Procedure: ESOPHAGOGASTRODUODENOSCOPY (EGD);  Surgeon: Maryruth Ole DASEN, MD;  Location[facility];  Service: Endoscopy;  Laterality: N/A;   FLEXIBLE SIGMOIDOSCOPY N/A 02/23/2019   Procedure: FLEXIBLE SIGMOIDOSCOPY;  Surgeon: Gaylyn Gladis PENNER, MD;  Location: [facility] ENDOSCOPY;  Service: Endoscopy;  Laterality: N/A;   IABP INSERTION  02/20/2014   Procedure: INTRA-AORTIC BALLOON PUMP INSERTION; Location: [facility]; Surgeon: Reyes Fruits, MD   INSERTION OF ARTERIOVENOUS (AV) ARTEGRAFT ARM Right 03/09/2024   Procedure: INSERTION, GRAFT, ARTERIOVENOUS, UPPER EXTREMITY;  Surgeon: Marea Selinda RAMAN,  MD;  Location: [facility];  Service: Vascular;  Laterality: Right;  BRACHIAL AXILLARY   INSERTION OF DIALYSIS CATHETER N/A 04/20/2024   Procedure: INSERTION OF DIALYSIS CATHETER;  Surgeon: Marea Selinda RAMAN, MD;  Location: [facility];  Service: Vascular;  Laterality: N/A;   LEFT HEART CATH AND CORONARY ANGIOGRAPHY Left 01/24/2002   Procedure: LEFT HEART CATH AND CORONARY ANGIOGRAPHY; Location: [facility]; Surgeon: Margie Lovelace, MD   LEFT HEART CATH AND CORONARY ANGIOGRAPHY Left 05/25/2012   Procedure: LEFT HEART CATH AND CORONARY ANGIOGRAPHY; Location: [facility]; Surgeon: Margie Lovelace, MD   LEFT HEART CATH AND CORONARY ANGIOGRAPHY Left 10/09/2013   Procedure: LEFT HEART CATH AND CORONARY  ANGIOGRAPHY; Location: [facility]; Surgeon: Vinie Jude, MD   LEFT HEART CATH AND CORS/GRAFTS ANGIOGRAPHY N/A 08/08/2018   Procedure: LEFT HEART CATH AND CORS/GRAFTS ANGIOGRAPHY;  Surgeon: Fernand Denyse LABOR, MD;  Location: [facility];  Service: Cardiovascular;  Laterality: N/A;   LEFT HEART CATH AND CORS/GRAFTS ANGIOGRAPHY N/A 01/30/2019   Procedure: LEFT HEART CATH AND CORS/GRAFTS ANGIOGRAPHY;  Surgeon: Fernand Denyse LABOR, MD;  Location: [facility];  Service: Cardiovascular;  Laterality: N/A;   LIGATION ARTERIOVENOUS GORTEX GRAFT Right 04/20/2024   Procedure: LIGATION ARTERIOVENOUS GORTEX GRAFT;  Surgeon: Marea Selinda RAMAN, MD;  Location: [facility];  Service: Vascular;  Laterality: Right;   LOWER EXTREMITY ANGIOGRAPHY Left 04/24/2024   Procedure: Lower Extremity Angiography;  Surgeon: Marea Selinda RAMAN, MD;  Location: [facility];  Service: Cardiovascular;  Laterality: Left;   ultrasound guided pericardiocentesis     UPPER EXTREMITY ANGIOGRAPHY Right 03/30/2024   Procedure: Upper Extremity Angiography;  Surgeon: Marea Selinda RAMAN, MD;  Location: [facility];  Service: Cardiovascular;  Laterality: Right;    Medications Prior to Admission  Medication Sig Dispense Refill Last Dose/Taking   albuterol  (VENTOLIN  HFA) 108 (90 Base) MCG/ACT inhaler Inhale 1-2 puffs into the lungs every 6 (six) hours as needed for wheezing or shortness of breath. 20.1 g 3 Taking As Needed   apixaban  (ELIQUIS ) 5 MG TABS tablet Take 5 mg by mouth 2 (two) times daily.   06/22/2024 Morning   clobetasol cream (TEMOVATE) 0.05 % Apply 1 Application topically daily.   Taking   hydrOXYzine  (ATARAX /VISTARIL ) 50 MG tablet Take 50 mg by mouth 2 (two) times daily.   06/22/2024   levothyroxine  (SYNTHROID ) 75 MCG tablet Take 75 mcg by mouth daily before breakfast.    06/22/2024   lidocaine -prilocaine  (EMLA ) cream Apply 1 Application topically once.   Taking   metoprolol  succinate (TOPROL -XL) 25 MG 24 hr tablet Take 1 tablet by  mouth every morning.   06/22/2024   midodrine  (PROAMATINE ) 10 MG tablet Take 1 tablet (10 mg total) by mouth 3 (three) times daily. (Patient taking differently: Take 10 mg by mouth 2 (two) times daily.) 90 tablet 0 06/22/2024   oxyCODONE -acetaminophen  (PERCOCET) 5-325 MG tablet Take 1 tablet by mouth 2 (two) times daily as needed for severe pain (pain score 7-10). 20 tablet 0 Taking As Needed   cephALEXin  (KEFLEX ) 500 MG capsule Take 1 capsule (500 mg total) by mouth 2 (two) times daily. (Patient not taking: Reported on 06/23/2024) 14 capsule 0 Not Taking   colchicine  0.6 MG tablet Take 1 tablet (0.6 mg total) by mouth once for 1 dose. (Patient not taking: Reported on 05/23/2024) 1 tablet 0    doxycycline  (VIBRA -TABS) 100 MG tablet Take 1 tablet (100 mg total) by mouth 2 (two) times daily. (Patient not taking: Reported on 05/23/2024) 20  tablet 0 Not Taking   pantoprazole  (PROTONIX ) 40 MG tablet Take 40 mg by mouth every morning. (Patient not taking: Reported on 06/23/2024)   Not Taking   pregabalin (LYRICA) 50 MG capsule Take 50 mg by mouth daily. (Patient not taking: Reported on 06/23/2024)   Not Taking   ranolazine  (RANEXA ) 1000 MG SR tablet Take 1,000 mg by mouth 2 (two) times daily. (Patient not taking: Reported on 06/23/2024)   Not Taking   rosuvastatin  (CRESTOR ) 20 MG tablet Take 20 mg by mouth at bedtime. (Patient not taking: Reported on 06/23/2024)   Not Taking   sevelamer  carbonate (RENVELA ) 800 MG tablet Take 3,200 mg by mouth 3 (three) times daily with meals. (Patient not taking: Reported on 06/23/2024)   Not Taking   triamcinolone  cream (KENALOG ) 0.1 % Apply 1 Application topically 2 (two) times daily. (Patient not taking: Reported on 06/23/2024) 453.6 g 2 Not Taking   venlafaxine (EFFEXOR) 37.5 MG tablet Take 37.5 mg by mouth 2 (two) times daily with a meal. (Patient not taking: Reported on 06/23/2024)   Not Taking   Social History   Socioeconomic History   Marital status: Single    Spouse name: Not on file    Number of children: Not on file   Years of education: Not on file   Highest education level: Not on file  Occupational History   Not on file  Tobacco Use   Smoking status: Every Day    Current packs/day: 0.25    Average packs/day: 0.3 packs/day for 20.0 years (5.0 ttl pk-yrs)    Types: Cigarettes   Smokeless tobacco: Never   Tobacco comments:    smokes 1 - 1.5 cigarettes a day  Vaping Use   Vaping status: Never Used  Substance and Sexual Activity   Alcohol use: No    Comment: social   Drug use: No   Sexual activity: Not Currently    Birth control/protection: Post-menopausal  Other Topics Concern   Not on file  Social History Narrative   Lives alone   Social Drivers of Health   Tobacco Use: High Risk (06/23/2024)   Patient History    Smoking Tobacco Use: Every Day    Smokeless Tobacco Use: Never    Passive Exposure: Not on file  Financial Resource Strain: Low Risk  (05/31/2024)   Received from [facility]   Overall Financial Resource Strain (CARDIA)    Difficulty of Paying Living Expenses: Not hard at all  Food Insecurity: No Food Insecurity (06/23/2024)   Epic    Worried About Radiation Protection Practitioner of Food in the Last Year: Never true    Ran Out of Food in the Last Year: Never true  Transportation Needs: No Transportation Needs (06/23/2024)   Epic    Lack of Transportation (Medical): No    Lack of Transportation (Non-Medical): No  Physical Activity: Not on file  Stress: Not on file  Social Connections: Not on file  Intimate Partner Violence: Not At Risk (06/23/2024)   Epic    Fear of Current or Ex-Partner: No    Emotionally Abused: No    Physically Abused: No    Sexually Abused: No  Depression (PHQ2-9): Low Risk (07/06/2023)   Depression (PHQ2-9)    PHQ-2 Score: 3  Alcohol Screen: Not on file  Housing: Low Risk (06/23/2024)   Epic    Unable to Pay for Housing in the Last Year: No    Number of Times Moved in the Last Year: 0  Homeless in the Last Year:  No  Utilities: Not At Risk (06/23/2024)   Epic    Threatened with loss of utilities: No  Health Literacy: Not on file    Family History  Problem Relation Age of Onset   Hypertension Mother    Ovarian cancer Mother    Diabetes type II Mother    Hypertension Father    Kidney disease Father    Hypertension Sister    Diabetes type II Maternal Grandmother    Breast cancer Maternal Grandmother    Breast cancer Maternal Aunt    Hypertension Other    Cancer Other    Renal Disease Other      Vitals:   06/27/24 0204 06/27/24 0504 06/27/24 0612 06/27/24 0831  BP: 92/60 108/65  107/71  Pulse: 62 (!) 42 68 85  Resp: 17 17  18   Temp: 97.7 F (36.5 C) 98.3 F (36.8 C)  97.9 F (36.6 C)  TempSrc:    Oral  SpO2: 99% 98% 97% 99%  Weight:  75.2 kg    Height:        PHYSICAL EXAM General: Chronically ill appearing female, well nourished, in no acute distress. HEENT: Normocephalic and atraumatic. Neck: No JVD.  Lungs: Normal respiratory effort on room air. Clear bilaterally to auscultation. No wheezes, crackles, rhonchi.  Heart: HRRR. Normal S1 and S2 without gallops or murmurs.  Abdomen: Non-distended appearing.  Msk: Normal strength and tone for age. Extremities: Warm and well perfused. No clubbing, cyanosis. No edema.  Neuro: Alert and oriented X 3. Psych: Answers questions appropriately.   Labs: Basic Metabolic Panel: Recent Labs    06/25/24 0354 06/26/24 0358 06/27/24 0523  NA 138 141 142  K 4.1 4.0 4.0  CL 97* 98 95*  CO2 25 24 24   GLUCOSE 88 89 91  BUN 31* 40* 22  CREATININE 5.94* 7.21* 4.85*  CALCIUM  9.8 10.1 10.2  MG 2.2 2.1  --   PHOS 5.7* 6.5*  --    Liver Function Tests: Recent Labs    06/25/24 0354 06/26/24 0358  ALBUMIN  3.9 4.0   No results for input(s): LIPASE, AMYLASE in the last 72 hours. CBC: Recent Labs    06/26/24 0358 06/27/24 0523  WBC 5.0 5.7  HGB 11.6* 11.5*  HCT 37.4 35.5*  MCV 100.3* 99.4  PLT 110* 103*   Cardiac  Enzymes: No results for input(s): CKTOTAL, CKMB, CKMBINDEX, TROPONINIHS in the last 72 hours. BNP: No results for input(s): BNP in the last 72 hours. D-Dimer: No results for input(s): DDIMER in the last 72 hours. Hemoglobin A1C: No results for input(s): HGBA1C in the last 72 hours. Fasting Lipid Panel: No results for input(s): CHOL, HDL, LDLCALC, TRIG, CHOLHDL, LDLDIRECT in the last 72 hours. Thyroid  Function Tests: No results for input(s): TSH, T4TOTAL, T3FREE, THYROIDAB in the last 72 hours.  Invalid input(s): FREET3 Anemia Panel: No results for input(s): VITAMINB12, FOLATE, FERRITIN, TIBC, IRON, RETICCTPCT in the last 72 hours.   Radiology: DG Chest 1 View Result Date: 06/25/2024 CLINICAL DATA:  Pulmonary edema. EXAM: CHEST  1 VIEW COMPARISON:  02/28/2024 and CT chest 02/16/2022. FINDINGS: Right IJ dialysis catheter terminates in the right atrium. Trachea is midline. Heart is enlarged. There may be subtle interstitial prominence bilaterally. Probable left pleural effusion. Findings appear unchanged. IMPRESSION: Probable mild interstitial pulmonary edema and small left pleural effusion. Electronically Signed   By: Newell Eke M.D.   On: 06/25/2024 11:11   ECHOCARDIOGRAM COMPLETE Result Date: 06/23/2024  ECHOCARDIOGRAM REPORT   Patient Name:   [name] Date of Exam: 06/23/2024 Medical Rec #:  [id number]         Height:       66.0 in Accession #:    [id number]        Weight:       164.5 lb Date of Birth:  1961/05/23          BSA:          1.840 m Patient Age:    63 years          BP:           101/73 mmHg Patient Gender: F                 HR:           107 bpm. Exam Location:  [facility] Procedure: 2D Echo, Color Doppler and Cardiac Doppler (Both Spectral and Color            Flow Doppler were utilized during procedure). STAT ECHO Indications:     Syncope R55  History:         Patient has prior history of Echocardiogram examinations, most                   recent 05/17/2023. COPD; Arrythmias:Atrial Fibrillation and                  Atrial Flutter.  Sonographer:     Christopher Furnace Referring Phys:  [id number] ROBET KIM Diagnosing Phys: Dwayne D Callwood MD IMPRESSIONS  1. Left ventricular ejection fraction, by estimation, is 25 to 30%. The left ventricle has severely decreased function. The left ventricle demonstrates global hypokinesis. The left ventricular internal cavity size was severely dilated. Left ventricular diastolic function could not be evaluated.  2. Right ventricular systolic function is severely reduced. The right ventricular size is severely enlarged. Mildly increased right ventricular wall thickness.  3. Left atrial size was mildly dilated.  4. Right atrial size was severely dilated.  5. The mitral valve is normal in structure. Mild mitral valve regurgitation.  6. Tricuspid valve regurgitation is severe.  7. The aortic valve is grossly normal. Aortic valve regurgitation is not visualized. Aortic valve sclerosis is present, with no evidence of aortic valve stenosis. FINDINGS  Left Ventricle: Left ventricular ejection fraction, by estimation, is 25 to 30%. The left ventricle has severely decreased function. The left ventricle demonstrates global hypokinesis. Strain was performed and the global longitudinal strain is indeterminate. The left ventricular internal cavity size was severely dilated. There is borderline left ventricular hypertrophy. Left ventricular diastolic function could not be evaluated. Right Ventricle: The right ventricular size is severely enlarged. Mildly increased right ventricular wall thickness. Right ventricular systolic function is severely reduced. Left Atrium: Left atrial size was mildly dilated. Right Atrium: Right atrial size was severely dilated. Pericardium: There is no evidence of pericardial effusion. Mitral Valve: The mitral valve is normal in structure. Mild mitral valve regurgitation. Tricuspid Valve: The  tricuspid valve is grossly normal. Tricuspid valve regurgitation is severe. Aortic Valve: The aortic valve is grossly normal. Aortic valve regurgitation is not visualized. Aortic valve sclerosis is present, with no evidence of aortic valve stenosis. Aortic valve mean gradient measures 3.0 mmHg. Aortic valve peak gradient measures 4.4 mmHg. Aortic valve area, by VTI measures 2.08 cm. Pulmonic Valve: The pulmonic valve was not well visualized. Pulmonic valve regurgitation is mild. Aorta: The aortic root was not well visualized. IAS/Shunts:  No atrial level shunt detected by color flow Doppler. Additional Comments: 3D was performed not requiring image post processing on an independent workstation and was indeterminate.  LEFT VENTRICLE PLAX 2D LVIDd:         6.02 cm      Diastology LVIDs:         4.64 cm      LV e' medial:  4.64 cm/s LV PW:         1.11 cm      LV e' lateral: 8.32 cm/s LV IVS:        1.31 cm LVOT diam:     2.00 cm LV SV:         28 LV SV Index:   15 LVOT Area:     3.14 cm  LV Volumes (MOD) LV vol d, MOD A2C: 172.0 ml LV vol d, MOD A4C: 173.0 ml LV vol s, MOD A2C: 168.0 ml LV vol s, MOD A4C: 119.0 ml LV SV MOD A2C:     4.0 ml LV SV MOD A4C:     173.0 ml LV SV MOD BP:      33.8 ml RIGHT VENTRICLE RV Basal diam:  4.52 cm RV Mid diam:    3.20 cm LEFT ATRIUM             Index        RIGHT ATRIUM           Index LA diam:        3.40 cm 1.85 cm/m   RA Area:     28.70 cm LA Vol (A2C):   86.5 ml 47.00 ml/m  RA Volume:   110.00 ml 59.77 ml/m LA Vol (A4C):   67.8 ml 36.84 ml/m LA Biplane Vol: 78.1 ml 42.44 ml/m  AORTIC VALVE AV Area (Vmax):    1.81 cm AV Area (Vmean):   1.64 cm AV Area (VTI):     2.08 cm AV Vmax:           105.00 cm/s AV Vmean:          79.900 cm/s AV VTI:            0.133 m AV Peak Grad:      4.4 mmHg AV Mean Grad:      3.0 mmHg LVOT Vmax:         60.60 cm/s LVOT Vmean:        41.700 cm/s LVOT VTI:          0.088 m LVOT/AV VTI ratio: 0.66  AORTA Ao Root diam: 2.30 cm TRICUSPID VALVE TR  Peak grad:   45.7 mmHg TR Vmax:        338.00 cm/s  SHUNTS Systemic VTI:  0.09 m Systemic Diam: 2.00 cm [name] MD Electronically signed by [name] MD Signature Date/Time: 06/23/2024/2:44:50 PM    Final    CT Cervical Spine Wo Contrast Result Date: 06/22/2024 EXAM: CT CERVICAL SPINE WITHOUT CONTRAST 06/22/2024 09:55:46 PM TECHNIQUE: CT of the cervical spine was performed without the administration of intravenous contrast. Multiplanar reformatted images are provided for review. Automated exposure control, iterative reconstruction, and/or weight based adjustment of the mA/kV was utilized to reduce the radiation dose to as low as reasonably achievable. COMPARISON: None available. CLINICAL HISTORY: Polytrauma, blunt. FINDINGS: BONES AND ALIGNMENT: Reversal of the normal mid cervical lordosis, chronic. No acute fracture or traumatic malalignment. DEGENERATIVE CHANGES: Severe erosive endplate changes at C5-C6, chronic but progressive when compared to remote  prior. Differential considerations include chronic osteomyelitis versus severe degenerative changes with bony remodeling. SOFT TISSUES: No prevertebral soft tissue swelling. IMPRESSION: 1. No traumatic injury to the cervical spine. 2. Severe erosive endplate changes at C5-C6, chronic but progressive. Differential considerations include chronic osteomyelitis versus severe degenerative changes with bony remodeling. Correlate with laboratory evaluation to exclude infection. Electronically signed by: Pinkie Pebbles MD MD 06/22/2024 10:07 PM EST RP Workstation: [id number]   CT HEAD WO CONTRAST ( ) Result Date: 06/22/2024 EXAM: CT HEAD WITHOUT CONTRAST 06/22/2024 09:55:46 PM TECHNIQUE: CT of the head was performed without the administration of intravenous contrast. Automated exposure control, iterative reconstruction, and/or weight based adjustment of the mA/kV was utilized to reduce the radiation dose to as low as reasonably achievable. COMPARISON:  MRI brain dated 08/16/2023. CLINICAL HISTORY: Polytrauma, blunt. FINDINGS: BRAIN AND VENTRICLES: No acute hemorrhage. No evidence of acute infarct. Remote infarcts in bilateral frontal lobes and right parietal lobe. Moderate white matter disease. Intracranial atherosclerosis. No hydrocephalus. No extra-axial collection. No mass effect or midline shift. ORBITS: No acute abnormality. SINUSES: No acute abnormality. SOFT TISSUES AND SKULL: No acute soft tissue abnormality. No skull fracture. IMPRESSION: 1. No acute intracranial abnormality. 2. 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 Electronically signed by: Pinkie Pebbles MD MD 06/22/2024 10:03 PM EST RP Workstation: [id number]   DG Chest Portable 1 View Result Date: 06/22/2024 EXAM: 1 VIEW XRAY OF THE CHEST 06/22/2024 09:30:41 PM COMPARISON: 02/28/2024. Prior CAG. CLINICAL HISTORY: weakness FINDINGS: LINES, TUBES AND DEVICES: Right dialysis catheter tip in the right atrium. LUNGS AND PLEURA: Vascular congestion. No overt edema. No focal pulmonary opacity. No pleural effusion. No pneumothorax. HEART AND MEDIASTINUM: Cardiomegaly. BONES AND SOFT TISSUES: No acute osseous abnormality. IMPRESSION: 1. Cardiomegaly with vascular congestion without overt edema. Electronically signed by: Franky Crease MD 06/22/2024 09:35 PM EST RP Workstation: [id number]    ECHO 06/23/2024: 1. Left ventricular ejection fraction, by estimation, is 25 to 30%. The left ventricle has severely decreased function. The left ventricle demonstrates global hypokinesis. The left ventricular internal cavity size was severely dilated. Left ventricular diastolic function could not be evaluated.   2. Right ventricular systolic function is severely reduced. The right ventricular size is severely enlarged. Mildly increased right ventricular  wall thickness.   3. Left atrial size was mildly dilated.   4. Right atrial size was severely dilated.   5. The mitral valve is normal in structure. Mild mitral valve  regurgitation.   6. Tricuspid valve regurgitation is severe.   7. The aortic valve is grossly normal. Aortic valve regurgitation is not visualized. Aortic valve sclerosis is present, with no evidence of aortic  valve stenosis.   TELEMETRY (personally reviewed): sinus rhythm, PVCs rate 60s  EKG (personally reviewed): NSR rate 60 bpm, PVC  Data reviewed by me 06/27/2024: last 24h vitals tele labs imaging I/O ED provider note, admission H&P, hospitalist progress note, vascular notes  Principal Problem:   Syncope    ASSESSMENT AND PLAN:  [name] is a 64 y.o. female  with a past medical history of chronic HFrEF, COPD, ESRD on HD, bipolar disorder, hypotension on midodrine , hx CVA who presented to the ED on 06/22/2024 for syncope. Cardiology was consulted for further evaluation.   # Syncope # Chronic HFrEF # ESRD on HD Patient presented after syncopal episode with associated recent episodes of nausea and vomiting and generalized weakness.  EKG without acute ischemic change and no evidence of been noted on telemetry during admission.  Echo this admission with EF  stable at 25-30%, global hypokinesis, severe LV dilation, severe RV enlargement with severe dysfunction, mild MR, severe TR. - Continue Crestor  20 mg daily. - Continue Eliquis  5 mg twice daily. - Given episode of syncope, patient would benefit from outpatient cardiac monitoring.  Recommend she follow-up with her primary cardiologist for set up of this.  Stable for discharge from cardiac perspective. Recommend outpatient monitoring and follow up with Dr. Fernand.  This patient's plan of care was discussed and created with Dr. Florencio and he is in agreement.  Signed: Danita Bloch, PA-C  06/27/2024, 9:29 AM [facility]      "

## 2024-06-27 NOTE — Plan of Care (Signed)
   Problem: Education: Goal: Knowledge of General Education information will improve Description Including pain rating scale, medication(s)/side effects and non-pharmacologic comfort measures Outcome: Progressing   Problem: Health Behavior/Discharge Planning: Goal: Ability to manage health-related needs will improve Outcome: Progressing

## 2024-06-27 NOTE — Plan of Care (Signed)
  Problem: Education: Goal: Knowledge of General Education information will improve Description: Including pain rating scale, medication(s)/side effects and non-pharmacologic comfort measures Outcome: Progressing   Problem: Nutrition: Goal: Adequate nutrition will be maintained Outcome: Progressing   Problem: Coping: Goal: Level of anxiety will decrease Outcome: Progressing   Problem: Activity: Goal: Risk for activity intolerance will decrease Outcome: Progressing

## 2024-06-28 ENCOUNTER — Other Ambulatory Visit: Payer: Self-pay

## 2024-06-28 LAB — BASIC METABOLIC PANEL WITH GFR
Anion gap: 23 — ABNORMAL HIGH (ref 5–15)
BUN: 32 mg/dL — ABNORMAL HIGH (ref 8–23)
CO2: 22 mmol/L (ref 22–32)
Calcium: 10.5 mg/dL — ABNORMAL HIGH (ref 8.9–10.3)
Chloride: 96 mmol/L — ABNORMAL LOW (ref 98–111)
Creatinine, Ser: 5.87 mg/dL — ABNORMAL HIGH (ref 0.44–1.00)
GFR, Estimated: 8 mL/min — ABNORMAL LOW
Glucose, Bld: 102 mg/dL — ABNORMAL HIGH (ref 70–99)
Potassium: 4.2 mmol/L (ref 3.5–5.1)
Sodium: 141 mmol/L (ref 135–145)

## 2024-06-28 LAB — CBC
HCT: 35.1 % — ABNORMAL LOW (ref 36.0–46.0)
Hemoglobin: 11.3 g/dL — ABNORMAL LOW (ref 12.0–15.0)
MCH: 31.8 pg (ref 26.0–34.0)
MCHC: 32.2 g/dL (ref 30.0–36.0)
MCV: 98.9 fL (ref 80.0–100.0)
Platelets: 101 K/uL — ABNORMAL LOW (ref 150–400)
RBC: 3.55 MIL/uL — ABNORMAL LOW (ref 3.87–5.11)
RDW: 17.9 % — ABNORMAL HIGH (ref 11.5–15.5)
WBC: 6.2 K/uL (ref 4.0–10.5)
nRBC: 0 % (ref 0.0–0.2)

## 2024-06-28 MED ORDER — HEPARIN SODIUM (PORCINE) 1000 UNIT/ML IJ SOLN
INTRAMUSCULAR | Status: AC
  Filled 2024-06-28: qty 5

## 2024-06-28 NOTE — Progress Notes (Signed)
 Hemodialysis Note:  Received patient in bed to unit. Alert and oriented. Informed consent singed and in chart.  Treatment initiated: 0800 Treatment completed: 1146  Access used: Right internal jugular catheter Access issues: None  Patient tolerated well. Transported back to room, alert without acute distress. Report given to patient's RN.  Total UF removed: 2 Liters Medications given: Sodium Thiosulfate  IV  Post HD weight: 70.5 Kg  [facility]

## 2024-06-28 NOTE — Discharge Summary (Signed)
 [name] FMW:[id number] DOB: 1961/05/23 DOA: 06/22/2024  PCP: Orlean Alan HERO, FNP  Admit date: 06/22/2024 Discharge date: 06/28/2024  Time spent: 35 minutes  Recommendations for Outpatient Follow-up:  Pcp f/u Cardiology f/u, consider event monitor     Discharge Diagnoses:  Principal Problem:   Syncope   Discharge Condition: stable  Diet recommendation: heart healthy low sodium  Filed Weights   06/27/24 0504 06/28/24 0443 06/28/24 0737  Weight: 75.2 kg 75.1 kg 72.5 kg    History of present illness:  [name] is a 64 y.o. female with a pmh of of ESRD on HD (MWF), COPD, CHF, bipolar disorder, chronic hypotension on midodrine , HLD and prior CVA who presented to [facility] ED via EMS following syncopal episode.    History was gathered per chart review Per patient, her normal dialysis schedule is MWF. At her most recent session on Wednesday, she was told that her blood pressure was too low to get dialysis.  Patient states prior to arrival she may have lost consciousness, fell to the ground but was unsure if she hit her head. She reported she has been very nauseated with multiple episodes of vomiting prior to arrival and has felt generalized weakness. Per chart review, it is noted she had a history of syncope and collapse in 2023.    Hospital Course:   Acute on Chronic Hypoxic Respiratory Failure - resolved Suspect due to fluid overload with elevated BNP and interstitial edema on CXR Now weaned to 2L [place], which is baseline   Circulatory Shock - cardiogenic   Chronic Hypotension Required levophed  in the icu, now weaned to home midodrine    Chronic HFrEF Syncope and Collapse (Hx of the same in 2023) - unclear etiology ? Suspect syncope and collapse likely due to hypotension, trauma workup negative BNP elevated, CXR with overt edema.  Lactic acidosis resolved Echo shows EF 25 to 30%, severely decreased, LV global hypokinesis.  RV systolic function severely reduced.  Mild  MR, severe TR Not on diuresis as patient is anuric Metoprolol  on hold due to bradycardia Cardiology following, appreciate recs Recommend following up with primary cardiology Dr. Fernand for outpatient cardiac monitor Dc plan reviewed with patient's daughter who is in agreement   Mildly Elevated Troponin CAD s/p, ischemic cardiomyopathy CVA Elevated troponin likely due to demand ischemia On Eliquis , statin   Atrial Fibrillation on Eliquis  Metoprolol  on hold due to HR in 50-60's On Eliquis    ESRD on HD (MWF) Acute on Chronic Renal Failure Anion gap metabolic acidosis Maintained on dialysis here  Calciphylaxis On sodium thiosulfate    Left foot pain Chronic Pain s/t Vascular Ulcers Seen by vascular surgery, appreciate recs Perfusion likely suboptimal, no further workup, Normal angiogram of LLE 2 months ago Limited interventional options per vascular surgeon-discussed with Dr. Marea on 01/12 Pain management, requiring Percocet Likely calciphylaxis as above   GERD PPI   COPD Not in exacerbation, DuoNebs as needed   Chronic Thrombocytopenia stable   Incidental findings Severe erosive endplate changes at C5-C6, chronic but progressive.  Differentials include chronic osteomyelitis vs severe degenerative changes with bony remodeling -  Denies pain.  No leukocytosis, no evidence of infection clinically, will monitor  Debility Pt/ot advising home health, ordered  Procedures: hemodialysis   Consultations: Vascular nephrology  Discharge Exam: Vitals:   06/28/24 1000 06/28/24 1030  BP: (!) 133/109 (!) 151/112  Pulse: (!) 51 65  Resp: 18   Temp:    SpO2: 99% 98%    General: NAD Cardiovascular:  RRR, distant heart sounds, soft systolic murmur Respiratory: CTAB save for bibasilar rales  Discharge Instructions   Discharge Instructions     Diet - low sodium heart healthy   Complete by: As directed    Diet renal with fluid restriction   Complete by: As directed     Discharge instructions   Complete by: As directed    Follow-up with PCP within 1 week -repeat BMP, CBC HR, BP  Follow-up with nephrology for ongoing dialysis Follow-up with primary cardiology -recommend outpatient monitor placed Has chronic home O2 as needed  Home PT/OT/RN arranged   Discharge wound care:   Complete by: As directed    Cleanse L posterior heel wound with Vashe, do not rinse. Apply a piece of Vashe moistened gauze to open wound bed daily then cover over entire area with Xeroform gauze Soila [phone]) and secure with silicone foam or Kerlix roll gauze whichever is preferred.   Discharge wound care:   Complete by: As directed    Cleanse L posterior heel wound with Vashe, do not rinse. Apply a piece of Vashe moistened gauze to open wound bed daily then cover over entire area with Xeroform gauze Soila [phone] and secure with silicone foam or Kerlix roll gauze whichever is preferred.   Increase activity slowly   Complete by: As directed    Increase activity slowly   Complete by: As directed       Allergies as of 06/28/2024       Reactions   Morphine  And Codeine Hives   Levaquin [levofloxacin] Itching, Other (See Comments)   Severe itching; prickly sensation; severe left leg pain; immobility.   Enalapril  Other (See Comments)   hallucinations   Latex Rash   Tape Rash, Other (See Comments)        Medication List     PAUSE taking these medications    metoprolol  succinate 25 MG 24 hr tablet Wait to take this until your doctor or other care provider tells you to start again. Commonly known as: TOPROL -XL Take 1 tablet by mouth every morning.       STOP taking these medications    cephALEXin  500 MG capsule Commonly known as: KEFLEX    doxycycline  100 MG tablet Commonly known as: VIBRA -TABS       TAKE these medications    albuterol  108 (90 Base) MCG/ACT inhaler Commonly known as: VENTOLIN  HFA Inhale 1-2 puffs into the lungs every 6 (six) hours as needed  for wheezing or shortness of breath.   clobetasol cream 0.05 % Commonly known as: TEMOVATE Apply 1 Application topically daily.   colchicine  0.6 MG tablet Take 1 tablet (0.6 mg total) by mouth once for 1 dose.   Eliquis  5 MG Tabs tablet Generic drug: apixaban  Take 5 mg by mouth 2 (two) times daily.   hydrOXYzine  50 MG tablet Commonly known as: ATARAX  Take 50 mg by mouth 2 (two) times daily.   levothyroxine  75 MCG tablet Commonly known as: SYNTHROID  Take 75 mcg by mouth daily before breakfast.   lidocaine -prilocaine  cream Commonly known as: EMLA  Apply 1 Application topically once.   midodrine  10 MG tablet Commonly known as: PROAMATINE  Take 1 tablet (10 mg total) by mouth 3 (three) times daily. What changed: when to take this   oxyCODONE -acetaminophen  5-325 MG tablet Commonly known as: Percocet Take 1 tablet by mouth every 6 (six) hours as needed for severe pain (pain score 7-10). What changed: when to take this   pantoprazole  40 MG tablet Commonly known  as: PROTONIX  Take 1 tablet (40 mg total) by mouth every morning.   pregabalin 50 MG capsule Commonly known as: LYRICA Take 50 mg by mouth daily.   ranolazine  1000 MG SR tablet Commonly known as: RANEXA  Take 1,000 mg by mouth 2 (two) times daily.   rosuvastatin  20 MG tablet Commonly known as: CRESTOR  Take 20 mg by mouth at bedtime.   sevelamer  carbonate 800 MG tablet Commonly known as: RENVELA  Take 3,200 mg by mouth 3 (three) times daily with meals.   triamcinolone  cream 0.1 % Commonly known as: KENALOG  Apply 1 Application topically 2 (two) times daily.   venlafaxine 37.5 MG tablet Commonly known as: EFFEXOR Take 37.5 mg by mouth 2 (two) times daily with a meal.               Discharge Care Instructions  (From admission, onward)           Start     Ordered   06/28/24 0000  Discharge wound care:       Comments: Cleanse L posterior heel wound with Vashe, do not rinse. Apply a piece of Vashe  moistened gauze to open wound bed daily then cover over entire area with Xeroform gauze Soila [phone]) and secure with silicone foam or Kerlix roll gauze whichever is preferred.   06/28/24 1104   06/27/24 0000  Discharge wound care:       Comments: Cleanse L posterior heel wound with Vashe, do not rinse. Apply a piece of Vashe moistened gauze to open wound bed daily then cover over entire area with Xeroform gauze Soila [phone]) and secure with silicone foam or Kerlix roll gauze whichever is preferred.   06/27/24 1538           Allergies[1]  Contact information for follow-up providers     Fernand Denyse LABOR, MD. Go on 07/04/2024.   Specialty: Cardiology Why: Appt @ 11:15 am Contact information: [address] [place] [address] [phone]         Orlean Alan HERO, FNP Follow up.   Specialty: Family Medicine Contact information: [address] [place] [address] [phone]              Contact information for after-discharge care     Home Medical Care     [facility] .   Service: Home Health Services                      The results of significant diagnostics from this hospitalization (including imaging, microbiology, ancillary and laboratory) are listed below for reference.    Significant Diagnostic Studies: DG Chest 1 View Result Date: 06/25/2024 CLINICAL DATA:  Pulmonary edema. EXAM: CHEST  1 VIEW COMPARISON:  02/28/2024 and CT chest 02/16/2022. FINDINGS: Right IJ dialysis catheter terminates in the right atrium. Trachea is midline. Heart is enlarged. There may be subtle interstitial prominence bilaterally. Probable left pleural effusion. Findings appear unchanged. IMPRESSION: Probable mild interstitial pulmonary edema and small left pleural effusion. Electronically Signed   By: Newell Eke M.D.   On: 06/25/2024 11:11   ECHOCARDIOGRAM COMPLETE Result Date: 06/23/2024    ECHOCARDIOGRAM REPORT   Patient Name:   [name] Date of Exam: 06/23/2024 Medical Rec #:  [id number]         Height:       66.0 in Accession #:    [id number]        Weight:  164.5 lb Date of Birth:  [birth year]          BSA:          1.840 m Patient Age:    63 years          BP:           101/73 mmHg Patient Gender: F                 HR:           107 bpm. Exam Location:  [facility] Procedure: 2D Echo, Color Doppler and Cardiac Doppler (Both Spectral and Color            Flow Doppler were utilized during procedure). STAT ECHO Indications:     Syncope R55  History:         Patient has prior history of Echocardiogram examinations, most                  recent 05/17/2023. COPD; Arrythmias:Atrial Fibrillation and                  Atrial Flutter.  Sonographer:     Christopher Furnace Referring Phys:  [id number] ROBET KIM Diagnosing Phys: Dwayne D Callwood MD IMPRESSIONS  1. Left ventricular ejection fraction, by estimation, is 25 to 30%. The left ventricle has severely decreased function. The left ventricle demonstrates global hypokinesis. The left ventricular internal cavity size was severely dilated. Left ventricular diastolic function could not be evaluated.  2. Right ventricular systolic function is severely reduced. The right ventricular size is severely enlarged. Mildly increased right ventricular wall thickness.  3. Left atrial size was mildly dilated.  4. Right atrial size was severely dilated.  5. The mitral valve is normal in structure. Mild mitral valve regurgitation.  6. Tricuspid valve regurgitation is severe.  7. The aortic valve is grossly normal. Aortic valve regurgitation is not visualized. Aortic valve sclerosis is present, with no evidence of aortic valve stenosis. FINDINGS  Left Ventricle: Left ventricular ejection fraction, by estimation, is 25 to 30%. The left ventricle has severely decreased function. The left ventricle demonstrates global hypokinesis. Strain was performed and the global longitudinal strain is indeterminate. The left ventricular  internal cavity size was severely dilated. There is borderline left ventricular hypertrophy. Left ventricular diastolic function could not be evaluated. Right Ventricle: The right ventricular size is severely enlarged. Mildly increased right ventricular wall thickness. Right ventricular systolic function is severely reduced. Left Atrium: Left atrial size was mildly dilated. Right Atrium: Right atrial size was severely dilated. Pericardium: There is no evidence of pericardial effusion. Mitral Valve: The mitral valve is normal in structure. Mild mitral valve regurgitation. Tricuspid Valve: The tricuspid valve is grossly normal. Tricuspid valve regurgitation is severe. Aortic Valve: The aortic valve is grossly normal. Aortic valve regurgitation is not visualized. Aortic valve sclerosis is present, with no evidence of aortic valve stenosis. Aortic valve mean gradient measures 3.0 mmHg. Aortic valve peak gradient measures 4.4 mmHg. Aortic valve area, by VTI measures 2.08 cm. Pulmonic Valve: The pulmonic valve was not well visualized. Pulmonic valve regurgitation is mild. Aorta: The aortic root was not well visualized. IAS/Shunts: No atrial level shunt detected by color flow Doppler. Additional Comments: 3D was performed not requiring image post processing on an independent workstation and was indeterminate.  LEFT VENTRICLE PLAX 2D LVIDd:         6.02 cm      Diastology LVIDs:         4.64  cm      LV e' medial:  4.64 cm/s LV PW:         1.11 cm      LV e' lateral: 8.32 cm/s LV IVS:        1.31 cm LVOT diam:     2.00 cm LV SV:         28 LV SV Index:   15 LVOT Area:     3.14 cm  LV Volumes (MOD) LV vol d, MOD A2C: 172.0 ml LV vol d, MOD A4C: 173.0 ml LV vol s, MOD A2C: 168.0 ml LV vol s, MOD A4C: 119.0 ml LV SV MOD A2C:     4.0 ml LV SV MOD A4C:     173.0 ml LV SV MOD BP:      33.8 ml RIGHT VENTRICLE RV Basal diam:  4.52 cm RV Mid diam:    3.20 cm LEFT ATRIUM             Index        RIGHT ATRIUM           Index LA diam:         3.40 cm 1.85 cm/m   RA Area:     28.70 cm LA Vol (A2C):   86.5 ml 47.00 ml/m  RA Volume:   110.00 ml 59.77 ml/m LA Vol (A4C):   67.8 ml 36.84 ml/m LA Biplane Vol: 78.1 ml 42.44 ml/m  AORTIC VALVE AV Area (Vmax):    1.81 cm AV Area (Vmean):   1.64 cm AV Area (VTI):     2.08 cm AV Vmax:           105.00 cm/s AV Vmean:          79.900 cm/s AV VTI:            0.133 m AV Peak Grad:      4.4 mmHg AV Mean Grad:      3.0 mmHg LVOT Vmax:         60.60 cm/s LVOT Vmean:        41.700 cm/s LVOT VTI:          0.088 m LVOT/AV VTI ratio: 0.66  AORTA Ao Root diam: 2.30 cm TRICUSPID VALVE TR Peak grad:   45.7 mmHg TR Vmax:        338.00 cm/s  SHUNTS Systemic VTI:  0.09 m Systemic Diam: 2.00 cm [name] MD Electronically signed by [name] MD Signature Date/Time: 06/23/2024/2:44:50 PM    Final    CT Cervical Spine Wo Contrast Result Date: 06/22/2024 EXAM: CT CERVICAL SPINE WITHOUT CONTRAST 06/22/2024 09:55:46 PM TECHNIQUE: CT of the cervical spine was performed without the administration of intravenous contrast. Multiplanar reformatted images are provided for review. Automated exposure control, iterative reconstruction, and/or weight based adjustment of the mA/kV was utilized to reduce the radiation dose to as low as reasonably achievable. COMPARISON: None available. CLINICAL HISTORY: Polytrauma, blunt. FINDINGS: BONES AND ALIGNMENT: Reversal of the normal mid cervical lordosis, chronic. No acute fracture or traumatic malalignment. DEGENERATIVE CHANGES: Severe erosive endplate changes at C5-C6, chronic but progressive when compared to remote prior. Differential considerations include chronic osteomyelitis versus severe degenerative changes with bony remodeling. SOFT TISSUES: No prevertebral soft tissue swelling. IMPRESSION: 1. No traumatic injury to the cervical spine. 2. Severe erosive endplate changes at C5-C6, chronic but progressive. Differential considerations include chronic osteomyelitis  versus severe degenerative changes with bony remodeling. Correlate with laboratory evaluation to exclude  infection. Electronically signed by: Pinkie Pebbles MD MD 06/22/2024 10:07 PM EST RP Workstation: [id number]   CT HEAD WO CONTRAST ( ) Result Date: 06/22/2024 EXAM: CT HEAD WITHOUT CONTRAST 06/22/2024 09:55:46 PM TECHNIQUE: CT of the head was performed without the administration of intravenous contrast. Automated exposure control, iterative reconstruction, and/or weight based adjustment of the mA/kV was utilized to reduce the radiation dose to as low as reasonably achievable. COMPARISON: MRI brain dated 08/16/2023. CLINICAL HISTORY: Polytrauma, blunt. FINDINGS: BRAIN AND VENTRICLES: No acute hemorrhage. No evidence of acute infarct. Remote infarcts in bilateral frontal lobes and right parietal lobe. Moderate white matter disease. Intracranial atherosclerosis. No hydrocephalus. No extra-axial collection. No mass effect or midline shift. ORBITS: No acute abnormality. SINUSES: No acute abnormality. SOFT TISSUES AND SKULL: No acute soft tissue abnormality. No skull fracture. IMPRESSION: 1. No acute intracranial abnormality. 2. 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 Electronically signed by: Pinkie Pebbles MD MD 06/22/2024 10:03 PM EST RP Workstation: [id number]   DG Chest Portable 1 View Result Date: 06/22/2024 EXAM: 1 VIEW XRAY OF THE CHEST 06/22/2024 09:30:41 PM COMPARISON: 02/28/2024. Prior CAG. CLINICAL HISTORY: weakness FINDINGS: LINES, TUBES AND DEVICES: Right dialysis catheter tip in the right atrium. LUNGS AND PLEURA: Vascular congestion. No overt edema. No focal pulmonary opacity. No pleural effusion. No pneumothorax. HEART AND MEDIASTINUM: Cardiomegaly. BONES AND SOFT TISSUES: No acute osseous abnormality. IMPRESSION: 1. Cardiomegaly with vascular congestion without overt edema. Electronically signed by: Franky Crease MD 06/22/2024 09:35 PM EST RP Workstation: [id number]    Microbiology: Recent  Results (from the past 240 hours)  Resp panel by RT-PCR (RSV, Flu A&B, Covid) Anterior Nasal Swab     Status: None   Collection Time: 06/22/24  9:28 PM   Specimen: Anterior Nasal Swab  Result Value Ref Range Status   SARS Coronavirus 2 by RT PCR NEGATIVE NEGATIVE Final    Comment: (NOTE) SARS-CoV-2 target nucleic acids are NOT DETECTED.  The SARS-CoV-2 RNA is generally detectable in upper respiratory specimens during the acute phase of infection. The lowest concentration of SARS-CoV-2 viral copies this assay can detect is 138 copies/mL. A negative result does not preclude SARS-Cov-2 infection and should not be used as the sole basis for treatment or other patient management decisions. A negative result may occur with  improper specimen collection/handling, submission of specimen other than nasopharyngeal swab, presence of viral mutation(s) within the areas targeted by this assay, and inadequate number of viral copies(<138 copies/mL). A negative result must be combined with clinical observations, patient history, and epidemiological information. The expected result is Negative.  Fact Sheet for Patients:  bloggercourse.com  Fact Sheet for Healthcare Providers:  seriousbroker.it  This test is no t yet approved or cleared by the [place]  FDA and  has been authorized for detection and/or diagnosis of SARS-CoV-2 by FDA under an Emergency Use Authorization (EUA). This EUA will remain  in effect (meaning this test can be used) for the duration of the COVID-19 declaration under Section 564(b)(1) of the Act, 21 U.S.C.section 360bbb-3(b)(1), unless the authorization is terminated  or revoked sooner.       Influenza A by PCR NEGATIVE NEGATIVE Final   Influenza B by PCR NEGATIVE NEGATIVE Final    Comment: (NOTE) The Xpert Xpress SARS-CoV-2/FLU/RSV plus assay is intended as an aid in the diagnosis of influenza from Nasopharyngeal swab  specimens and should not be used as a sole basis for treatment. Nasal washings and aspirates are unacceptable for Xpert Xpress SARS-CoV-2/FLU/RSV testing.  Fact Sheet for Patients:  bloggercourse.com  Fact Sheet for Healthcare Providers: seriousbroker.it  This test is not yet approved or cleared by the [place]  FDA and has been authorized for detection and/or diagnosis of SARS-CoV-2 by FDA under an Emergency Use Authorization (EUA). This EUA will remain in effect (meaning this test can be used) for the duration of the COVID-19 declaration under Section 564(b)(1) of the Act, 21 U.S.C. section 360bbb-3(b)(1), unless the authorization is terminated or revoked.     Resp Syncytial Virus by PCR NEGATIVE NEGATIVE Final    Comment: (NOTE) Fact Sheet for Patients: bloggercourse.com  Fact Sheet for Healthcare Providers: seriousbroker.it  This test is not yet approved or cleared by the [place]  FDA and has been authorized for detection and/or diagnosis of SARS-CoV-2 by FDA under an Emergency Use Authorization (EUA). This EUA will remain in effect (meaning this test can be used) for the duration of the COVID-19 declaration under Section 564(b)(1) of the Act, 21 U.S.C. section 360bbb-3(b)(1), unless the authorization is terminated or revoked.  Performed at [facility], [address] [place], [place] [address]   MRSA Next Gen by PCR, Nasal     Status: Abnormal   Collection Time: 06/22/24 11:51 PM   Specimen: Nasal Mucosa; Nasal Swab  Result Value Ref Range Status   MRSA by PCR Next Gen DETECTED (A) NOT DETECTED Final    Comment: RESULT CALLED TO, READ BACK BY AND VERIFIED WITH: ABIGAIL FOWLKE @0215  ON 06/23/24 SKL (NOTE) The GeneXpert MRSA Assay (FDA approved for NASAL specimens only), is one component of a comprehensive MRSA colonization surveillance program. It is  not intended to diagnose MRSA infection nor to guide or monitor treatment for MRSA infections. Test performance is not FDA approved in patients less than 31 years old. Performed at [facility], [address] [place], [place] [address]      Labs: Basic Metabolic Panel: Recent Labs  Lab 06/23/24 [phone] 06/23/24 0902 06/24/24 0734 06/25/24 0354 06/26/24 0358 06/27/24 0523 06/27/24 1431 06/28/24 0226  NA  --   --  137 138 141 142 140 141  K  --  3.8 3.6 4.1 4.0 4.0 4.2 4.2  CL  --   --  95* 97* 98 95* 94* 96*  CO2  --   --  25 25 24 24 24 22   GLUCOSE  --   --  124* 88 89 91 100* 102*  BUN  --   --  25* 31* 40* 22 26* 32*  CREATININE  --   --  4.79* 5.94* 7.21* 4.85* 5.25* 5.87*  CALCIUM   --   --  9.9 9.8 10.1 10.2 10.6* 10.5*  MG 2.0 2.0  --  2.2 2.1  --   --   --   PHOS 6.4*  --  4.6 5.7* 6.5*  --   --   --    Liver Function Tests: Recent Labs  Lab 06/22/24 2128 06/24/24 0734 06/25/24 0354 06/26/24 0358  AST 49*  --   --   --   ALT 27  --   --   --   ALKPHOS 85  --   --   --   BILITOT 0.7  --   --   --   PROT 7.4  --   --   --   ALBUMIN  4.0 4.1 3.9 4.0   No results for input(s): LIPASE, AMYLASE in the last 168 hours. No results for input(s): AMMONIA in the last 168 hours. CBC: Recent Labs  Lab 06/24/24 0734 06/25/24 0354 06/26/24 0358 06/27/24 0523 06/28/24 0226  WBC 6.8 4.6 5.0 5.7 6.2  HGB 11.9* 11.7* 11.6* 11.5* 11.3*  HCT 36.8 36.7 37.4 35.5* 35.1*  MCV 98.4 100.0 100.3* 99.4 98.9  PLT 118* 106* 110* 103* 101*   Cardiac Enzymes: No results for input(s): CKTOTAL, CKMB, CKMBINDEX, TROPONINI in the last 168 hours. BNP: BNP (last 3 results) No results for input(s): BNP in the last 8760 hours.  ProBNP (last 3 results) Recent Labs    06/23/24 0700 06/25/24 0354  PROBNP >35,000.0* >35,000.0*    CBG: Recent Labs  Lab 06/26/24 2030 06/27/24 0037 06/27/24 0427 06/27/24 0839 06/27/24 1200  GLUCAP 106* 111* 114* 126*  92       Signed:  Devaughn KATHEE Ban MD.  [facility] 06/28/2024, 11:06 AM     [1]  Allergies Allergen Reactions   Morphine  And Codeine Hives   Levaquin [Levofloxacin] Itching and Other (See Comments)    Severe itching; prickly sensation; severe left leg pain; immobility.   Enalapril  Other (See Comments)    hallucinations   Latex Rash   Tape Rash and Other (See Comments)

## 2024-06-28 NOTE — Plan of Care (Signed)

## 2024-06-28 NOTE — Progress Notes (Signed)
 " [facility]  ROUNDING NOTE   Subjective:   [name] is a 64 year old African-American female with past medical histories including CAD with CABG and end-stage renal disease on dialysis.  Patient presents to the emergency department after suffering a syncopal episode.  Patient has been admitted for Syncope [R55] NSTEMI (non-ST elevated myocardial infarction) (HCC) [I21.4] Hypotension, unspecified hypotension type [I95.9]   Patient is known to our clinic and receives outpatient dialysis treatments at [facility] on MWF schedule, supervised by Dr. Dennise.  Patient states she does not remember episode.  States she just awoke on the floor.  Unaware if she hit her head.  Reports she has been missing dialysis treatments due to increased lower extremity pain.   Update:  Patient seen and evaluated during dialysis   HEMODIALYSIS FLOWSHEET:  Blood Flow Rate (mL/min): 400 mL/min Arterial Pressure (mmHg): -170.5 mmHg Venous Pressure (mmHg): 185.66 mmHg TMP (mmHg): 14.75 mmHg Ultrafiltration Rate (mL/min): 1139 mL/min Dialysate Flow Rate (mL/min): 300 ml/min  Tolerating treatment well Denies pain   Objective:  Vital signs in last 24 hours:  Temp:  [97.6 F (36.4 C)-98.9 F (37.2 C)] 98 F (36.7 C) (01/14 0737) Pulse Rate:  [32-92] 62 (01/14 0930) Resp:  [11-18] 16 (01/14 0930) BP: (94-127)/(52-103) 94/52 (01/14 0930) SpO2:  [91 %-100 %] 97 % (01/14 0930) Weight:  [72.5 kg-75.1 kg] 72.5 kg (01/14 0737)  Weight change: -0.045 kg Filed Weights   06/27/24 0504 06/28/24 0443 06/28/24 0737  Weight: 75.2 kg 75.1 kg 72.5 kg    Intake/Output: I/O last 3 completed shifts: In: 913.4 [P.O.:720; IV Piggyback:193.4] Out: -    Intake/Output this shift:  No intake/output data recorded.  Physical Exam: General: NAD  Head: Normocephalic, atraumatic. Moist oral mucosal membranes  Eyes: Anicteric  Lungs:  Clear to auscultation  Heart: irregular  Abdomen:   Soft, nontender  Extremities: No peripheral edema.  Dressing over heel ulcer  Neurologic: Awake, alert, conversant  Skin: Warm,dry, no rash, RLE wound  Access: Right chest PermCath, left AVF +thrill +bruit    Basic Metabolic Panel: Recent Labs  Lab 06/23/24 0349 06/23/24 0902 06/24/24 0734 06/25/24 0354 06/26/24 0358 06/27/24 0523 06/27/24 1431 06/28/24 0226  NA  --   --  137 138 141 142 140 141  K  --  3.8 3.6 4.1 4.0 4.0 4.2 4.2  CL  --   --  95* 97* 98 95* 94* 96*  CO2  --   --  25 25 24 24 24 22   GLUCOSE  --   --  124* 88 89 91 100* 102*  BUN  --   --  25* 31* 40* 22 26* 32*  CREATININE  --   --  4.79* 5.94* 7.21* 4.85* 5.25* 5.87*  CALCIUM   --   --  9.9 9.8 10.1 10.2 10.6* 10.5*  MG 2.0 2.0  --  2.2 2.1  --   --   --   PHOS 6.4*  --  4.6 5.7* 6.5*  --   --   --     Liver Function Tests: Recent Labs  Lab 06/22/24 2128 06/24/24 0734 06/25/24 0354 06/26/24 0358  AST 49*  --   --   --   ALT 27  --   --   --   ALKPHOS 85  --   --   --   BILITOT 0.7  --   --   --   PROT 7.4  --   --   --  ALBUMIN  4.0 4.1 3.9 4.0   No results for input(s): LIPASE, AMYLASE in the last 168 hours. No results for input(s): AMMONIA in the last 168 hours.  CBC: Recent Labs  Lab 06/24/24 0734 06/25/24 0354 06/26/24 0358 06/27/24 0523 06/28/24 0226  WBC 6.8 4.6 5.0 5.7 6.2  HGB 11.9* 11.7* 11.6* 11.5* 11.3*  HCT 36.8 36.7 37.4 35.5* 35.1*  MCV 98.4 100.0 100.3* 99.4 98.9  PLT 118* 106* 110* 103* 101*    Cardiac Enzymes: No results for input(s): CKTOTAL, CKMB, CKMBINDEX, TROPONINI in the last 168 hours.  BNP: Invalid input(s): POCBNP  CBG: Recent Labs  Lab 06/26/24 2030 06/27/24 0037 06/27/24 0427 06/27/24 0839 06/27/24 1200  GLUCAP 106* 111* 114* 126* 92    Microbiology: Results for orders placed or performed during the hospital encounter of 06/22/24  Resp panel by RT-PCR (RSV, Flu A&B, Covid) Anterior Nasal Swab     Status: None   Collection  Time: 06/22/24  9:28 PM   Specimen: Anterior Nasal Swab  Result Value Ref Range Status   SARS Coronavirus 2 by RT PCR NEGATIVE NEGATIVE Final    Comment: (NOTE) SARS-CoV-2 target nucleic acids are NOT DETECTED.  The SARS-CoV-2 RNA is generally detectable in upper respiratory specimens during the acute phase of infection. The lowest concentration of SARS-CoV-2 viral copies this assay can detect is 138 copies/mL. A negative result does not preclude SARS-Cov-2 infection and should not be used as the sole basis for treatment or other patient management decisions. A negative result may occur with  improper specimen collection/handling, submission of specimen other than nasopharyngeal swab, presence of viral mutation(s) within the areas targeted by this assay, and inadequate number of viral copies(<138 copies/mL). A negative result must be combined with clinical observations, patient history, and epidemiological information. The expected result is Negative.  Fact Sheet for Patients:  bloggercourse.com  Fact Sheet for Healthcare Providers:  seriousbroker.it  This test is no t yet approved or cleared by the [place]  FDA and  has been authorized for detection and/or diagnosis of SARS-CoV-2 by FDA under an Emergency Use Authorization (EUA). This EUA will remain  in effect (meaning this test can be used) for the duration of the COVID-19 declaration under Section 564(b)(1) of the Act, 21 U.S.C.section 360bbb-3(b)(1), unless the authorization is terminated  or revoked sooner.       Influenza A by PCR NEGATIVE NEGATIVE Final   Influenza B by PCR NEGATIVE NEGATIVE Final    Comment: (NOTE) The Xpert Xpress SARS-CoV-2/FLU/RSV plus assay is intended as an aid in the diagnosis of influenza from Nasopharyngeal swab specimens and should not be used as a sole basis for treatment. Nasal washings and aspirates are unacceptable for Xpert Xpress  SARS-CoV-2/FLU/RSV testing.  Fact Sheet for Patients: bloggercourse.com  Fact Sheet for Healthcare Providers: seriousbroker.it  This test is not yet approved or cleared by the [place]  FDA and has been authorized for detection and/or diagnosis of SARS-CoV-2 by FDA under an Emergency Use Authorization (EUA). This EUA will remain in effect (meaning this test can be used) for the duration of the COVID-19 declaration under Section 564(b)(1) of the Act, 21 U.S.C. section 360bbb-3(b)(1), unless the authorization is terminated or revoked.     Resp Syncytial Virus by PCR NEGATIVE NEGATIVE Final    Comment: (NOTE) Fact Sheet for Patients: bloggercourse.com  Fact Sheet for Healthcare Providers: seriousbroker.it  This test is not yet approved or cleared by the [place]  FDA and has been authorized for detection and/or  diagnosis of SARS-CoV-2 by FDA under an Emergency Use Authorization (EUA). This EUA will remain in effect (meaning this test can be used) for the duration of the COVID-19 declaration under Section 564(b)(1) of the Act, 21 U.S.C. section 360bbb-3(b)(1), unless the authorization is terminated or revoked.  Performed at [facility], [address] [place], [place] [address]   MRSA Next Gen by PCR, Nasal     Status: Abnormal   Collection Time: 06/22/24 11:51 PM   Specimen: Nasal Mucosa; Nasal Swab  Result Value Ref Range Status   MRSA by PCR Next Gen DETECTED (A) NOT DETECTED Final    Comment: RESULT CALLED TO, READ BACK BY AND VERIFIED WITH: ABIGAIL FOWLKE @0215  ON 06/23/24 SKL (NOTE) The GeneXpert MRSA Assay (FDA approved for NASAL specimens only), is one component of a comprehensive MRSA colonization surveillance program. It is not intended to diagnose MRSA infection nor to guide or monitor treatment for MRSA infections. Test performance is not FDA  approved in patients less than 85 years old. Performed at [facility], [address] [place], [place] [address]     Coagulation Studies: No results for input(s): LABPROT, INR in the last 72 hours.  Urinalysis: No results for input(s): COLORURINE, LABSPEC, PHURINE, GLUCOSEU, HGBUR, BILIRUBINUR, KETONESUR, PROTEINUR, UROBILINOGEN, NITRITE, LEUKOCYTESUR in the last 72 hours.  Invalid input(s): APPERANCEUR    Imaging: No results found.    Medications:    sodium thiosulfate  25 g in sodium chloride  0.9 % 200 mL Infusion for Calciphylaxis 25 g (06/26/24 1646)     apixaban   5 mg Oral BID   Chlorhexidine  Gluconate Cloth  6 each Topical Q0600   feeding supplement (NEPRO CARB STEADY)  237 mL Oral TID BM   furosemide   60 mg Intravenous Once   levothyroxine   75 mcg Oral Q0600   midodrine   10 mg Oral TID WC   multivitamin  1 tablet Oral QHS   pantoprazole   40 mg Oral BH-q7a   rosuvastatin   20 mg Oral QHS   ipratropium-albuterol , lidocaine -prilocaine , ondansetron  (ZOFRAN ) IV, oxyCODONE -acetaminophen , pentafluoroprop-tetrafluoroeth, polyethylene glycol, senna  Assessment/ Plan:  [name] is a 64 y.o.  female with past medical histories including CAD with CABG and end-stage renal disease on dialysis.  Patient presents to the emergency department after suffering a syncopal episode.  Patient has been admitted for Syncope [R55] NSTEMI (non-ST elevated myocardial infarction) (HCC) [I21.4] Hypotension, unspecified hypotension type [I95.9]  [facility]/MWF/right PermCath/72.5 kg  End-stage renal disease on hemodialysis.  Receiving dialysis today, UF goal 1.5-2L as tolerated. Next treatment scheduled for Friday.   2.  Hypotension with atrial fibrillation: Echo with EF 20-25%. with Syncopal episode with collapse.  Status post amiodarone .  - continue midodrine  - Blood pressure 94/52 during dialysis   3. Anemia of chronic  kidney disease Lab Results  Component Value Date   HGB 11.3 (L) 06/28/2024    Hemoglobin acceptable, no need for ESA  4. Secondary Hyperparathyroidism: with outpatient labs: PTH 1577, phosphorus 8.7, calcium  9.3 on 05/22/24. Suspected calciphylaxis   Lab Results  Component Value Date   PTH 627 (H) 07/26/2020   CALCIUM  10.5 (H) 06/28/2024   CAION 0.99 (L) 04/20/2024   PHOS 6.5 (H) 06/26/2024    Continues to have significant issues with bone mineral metabolism parameters.  Also has ulceration overlying her left Achilles tendon.  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  Sodium thiosulfate  ordered with dialysis.    LOS: 6 Axyl Sitzman 1/14/20269:47 AM  . "

## 2024-06-28 NOTE — Plan of Care (Signed)
  Problem: Education: Goal: Knowledge of General Education information will improve Description: Including pain rating scale, medication(s)/side effects and non-pharmacologic comfort measures Outcome: Adequate for Discharge   Problem: Health Behavior/Discharge Planning: Goal: Ability to manage health-related needs will improve Outcome: Adequate for Discharge   Problem: Clinical Measurements: Goal: Ability to maintain clinical measurements within normal limits will improve Outcome: Adequate for Discharge Goal: Will remain free from infection Outcome: Adequate for Discharge Goal: Diagnostic test results will improve Outcome: Adequate for Discharge Goal: Respiratory complications will improve Outcome: Adequate for Discharge Goal: Cardiovascular complication will be avoided Outcome: Adequate for Discharge   Problem: Activity: Goal: Risk for activity intolerance will decrease Outcome: Adequate for Discharge   Problem: Nutrition: Goal: Adequate nutrition will be maintained Outcome: Adequate for Discharge   Problem: Coping: Goal: Level of anxiety will decrease Outcome: Adequate for Discharge   Problem: Elimination: Goal: Will not experience complications related to bowel motility Outcome: Adequate for Discharge Goal: Will not experience complications related to urinary retention Outcome: Adequate for Discharge   Problem: Pain Managment: Goal: General experience of comfort will improve and/or be controlled Outcome: Adequate for Discharge   Problem: Safety: Goal: Ability to remain free from injury will improve Outcome: Adequate for Discharge   Problem: Skin Integrity: Goal: Risk for impaired skin integrity will decrease Outcome: Adequate for Discharge   Problem: Acute Rehab PT Goals(only PT should resolve) Goal: Patient Will Transfer Sit To/From Stand Outcome: Adequate for Discharge Goal: Pt Will Ambulate Outcome: Adequate for Discharge Goal: Pt Will Go Up/Down  Stairs Outcome: Adequate for Discharge

## 2024-06-29 ENCOUNTER — Telehealth: Payer: Self-pay

## 2024-06-29 NOTE — Transitions of Care (Post Inpatient/ED Visit) (Signed)
" ° °  06/29/2024  Name: [name] MRN: [id number] DOB: Jul 19, 1960  Today's TOC FU Call Status: Today's TOC FU Call Status:: Unsuccessful Call (1st Attempt) Unsuccessful Call (1st Attempt) Date: 06/29/24  Attempted to reach the patient regarding the most recent Inpatient/ED visit.  Follow Up Plan: Additional outreach attempts will be made to reach the patient to complete the Transitions of Care (Post Inpatient/ED visit) call.   Medford Balboa, BSN, RN [place]  [facility]BC[facility] - Population Health RN Care Manager [phone]  "

## 2024-06-30 ENCOUNTER — Telehealth: Payer: Self-pay

## 2024-06-30 NOTE — Transitions of Care (Post Inpatient/ED Visit) (Signed)
" ° °  06/30/2024  Name: [name] MRN: [id number] DOB: December 31, 1960  Today's TOC FU Call Status: Today's TOC FU Call Status:: Unsuccessful Call (2nd Attempt) Unsuccessful Call (1st Attempt) Date: 06/29/24 Unsuccessful Call (2nd Attempt) Date: 06/30/24  Attempted to reach the patient regarding the most recent Inpatient/ED visit.  Follow Up Plan: Additional outreach attempts will be made to reach the patient to complete the Transitions of Care (Post Inpatient/ED visit) call.   Medford Balboa, BSN, RN [place]  VBCI - Population Health RN Care Manager [phone]  "

## 2024-07-01 ENCOUNTER — Other Ambulatory Visit: Payer: Self-pay

## 2024-07-01 ENCOUNTER — Emergency Department

## 2024-07-01 ENCOUNTER — Encounter: Payer: Self-pay | Admitting: Emergency Medicine

## 2024-07-01 ENCOUNTER — Inpatient Hospital Stay
Admission: EM | Admit: 2024-07-01 | Source: Home / Self Care | Attending: Student in an Organized Health Care Education/Training Program | Admitting: Student in an Organized Health Care Education/Training Program

## 2024-07-01 DIAGNOSIS — J9621 Acute and chronic respiratory failure with hypoxia: Secondary | ICD-10-CM

## 2024-07-01 DIAGNOSIS — I480 Paroxysmal atrial fibrillation: Secondary | ICD-10-CM | POA: Diagnosis present

## 2024-07-01 DIAGNOSIS — W19XXXA Unspecified fall, initial encounter: Principal | ICD-10-CM

## 2024-07-01 DIAGNOSIS — I9589 Other hypotension: Secondary | ICD-10-CM | POA: Diagnosis present

## 2024-07-01 DIAGNOSIS — M4642 Discitis, unspecified, cervical region: Secondary | ICD-10-CM

## 2024-07-01 DIAGNOSIS — G894 Chronic pain syndrome: Secondary | ICD-10-CM | POA: Diagnosis present

## 2024-07-01 DIAGNOSIS — I5042 Chronic combined systolic (congestive) and diastolic (congestive) heart failure: Secondary | ICD-10-CM | POA: Diagnosis present

## 2024-07-01 DIAGNOSIS — M25531 Pain in right wrist: Secondary | ICD-10-CM

## 2024-07-01 DIAGNOSIS — J810 Acute pulmonary edema: Secondary | ICD-10-CM

## 2024-07-01 DIAGNOSIS — N186 End stage renal disease: Secondary | ICD-10-CM | POA: Diagnosis present

## 2024-07-01 DIAGNOSIS — I10 Essential (primary) hypertension: Secondary | ICD-10-CM | POA: Diagnosis present

## 2024-07-01 DIAGNOSIS — Z951 Presence of aortocoronary bypass graft: Secondary | ICD-10-CM

## 2024-07-01 DIAGNOSIS — K7469 Other cirrhosis of liver: Secondary | ICD-10-CM | POA: Diagnosis present

## 2024-07-01 DIAGNOSIS — R55 Syncope and collapse: Secondary | ICD-10-CM | POA: Diagnosis present

## 2024-07-01 DIAGNOSIS — M4012 Other secondary kyphosis, cervical region: Secondary | ICD-10-CM

## 2024-07-01 DIAGNOSIS — E162 Hypoglycemia, unspecified: Secondary | ICD-10-CM

## 2024-07-01 DIAGNOSIS — R7881 Bacteremia: Secondary | ICD-10-CM

## 2024-07-01 DIAGNOSIS — F02A0 Dementia in other diseases classified elsewhere, mild, without behavioral disturbance, psychotic disturbance, mood disturbance, and anxiety: Secondary | ICD-10-CM | POA: Diagnosis present

## 2024-07-01 DIAGNOSIS — M47812 Spondylosis without myelopathy or radiculopathy, cervical region: Secondary | ICD-10-CM

## 2024-07-01 DIAGNOSIS — D638 Anemia in other chronic diseases classified elsewhere: Secondary | ICD-10-CM | POA: Diagnosis present

## 2024-07-01 DIAGNOSIS — E872 Acidosis, unspecified: Secondary | ICD-10-CM

## 2024-07-01 DIAGNOSIS — L8990 Pressure ulcer of unspecified site, unspecified stage: Secondary | ICD-10-CM | POA: Insufficient documentation

## 2024-07-01 DIAGNOSIS — G928 Other toxic encephalopathy: Secondary | ICD-10-CM

## 2024-07-01 DIAGNOSIS — F3170 Bipolar disorder, currently in remission, most recent episode unspecified: Secondary | ICD-10-CM | POA: Diagnosis present

## 2024-07-01 DIAGNOSIS — I469 Cardiac arrest, cause unspecified: Secondary | ICD-10-CM

## 2024-07-01 DIAGNOSIS — I5023 Acute on chronic systolic (congestive) heart failure: Secondary | ICD-10-CM

## 2024-07-01 DIAGNOSIS — Z992 Dependence on renal dialysis: Secondary | ICD-10-CM

## 2024-07-01 DIAGNOSIS — E039 Hypothyroidism, unspecified: Secondary | ICD-10-CM | POA: Diagnosis present

## 2024-07-01 DIAGNOSIS — R7689 Other specified abnormal immunological findings in serum: Secondary | ICD-10-CM | POA: Diagnosis present

## 2024-07-01 LAB — TROPONIN T, HIGH SENSITIVITY
Troponin T High Sensitivity: 155 ng/L (ref 0–19)
Troponin T High Sensitivity: 154 ng/L (ref 0–19)

## 2024-07-01 LAB — COMPREHENSIVE METABOLIC PANEL WITH GFR
ALT: 15 U/L (ref 0–44)
ALT: 15 U/L (ref 0–44)
AST: 36 U/L (ref 15–41)
AST: 36 U/L (ref 15–41)
Albumin: 3.8 g/dL (ref 3.5–5.0)
Albumin: 3.5 g/dL (ref 3.5–5.0)
Alkaline Phosphatase: 71 U/L (ref 38–126)
Alkaline Phosphatase: 68 U/L (ref 38–126)
Anion gap: 26 — ABNORMAL HIGH (ref 5–15)
Anion gap: 24 — ABNORMAL HIGH (ref 5–15)
BUN: 28 mg/dL — ABNORMAL HIGH (ref 8–23)
BUN: 32 mg/dL — ABNORMAL HIGH (ref 8–23)
CO2: 21 mmol/L — ABNORMAL LOW (ref 22–32)
CO2: 19 mmol/L — ABNORMAL LOW (ref 22–32)
Calcium: 10.4 mg/dL — ABNORMAL HIGH (ref 8.9–10.3)
Calcium: 10.2 mg/dL (ref 8.9–10.3)
Chloride: 92 mmol/L — ABNORMAL LOW (ref 98–111)
Chloride: 93 mmol/L — ABNORMAL LOW (ref 98–111)
Creatinine, Ser: 5.07 mg/dL — ABNORMAL HIGH (ref 0.44–1.00)
Creatinine, Ser: 5.43 mg/dL — ABNORMAL HIGH (ref 0.44–1.00)
GFR, Estimated: 9 mL/min — ABNORMAL LOW
GFR, Estimated: 8 mL/min — ABNORMAL LOW
Glucose, Bld: 70 mg/dL (ref 70–99)
Glucose, Bld: 90 mg/dL (ref 70–99)
Potassium: 4 mmol/L (ref 3.5–5.1)
Potassium: 4.3 mmol/L (ref 3.5–5.1)
Sodium: 138 mmol/L (ref 135–145)
Sodium: 135 mmol/L (ref 135–145)
Total Bilirubin: 1.2 mg/dL (ref 0.0–1.2)
Total Bilirubin: 1.2 mg/dL (ref 0.0–1.2)
Total Protein: 7.2 g/dL (ref 6.5–8.1)
Total Protein: 6.8 g/dL (ref 6.5–8.1)

## 2024-07-01 LAB — CBC WITH DIFFERENTIAL/PLATELET
Abs Immature Granulocytes: 0.19 K/uL — ABNORMAL HIGH (ref 0.00–0.07)
Basophils Absolute: 0 K/uL (ref 0.0–0.1)
Basophils Relative: 0 %
Eosinophils Absolute: 0.2 K/uL (ref 0.0–0.5)
Eosinophils Relative: 2 %
HCT: 38.7 % (ref 36.0–46.0)
Hemoglobin: 12.7 g/dL (ref 12.0–15.0)
Immature Granulocytes: 2 %
Lymphocytes Relative: 4 %
Lymphs Abs: 0.5 K/uL — ABNORMAL LOW (ref 0.7–4.0)
MCH: 31.8 pg (ref 26.0–34.0)
MCHC: 32.8 g/dL (ref 30.0–36.0)
MCV: 97 fL (ref 80.0–100.0)
Monocytes Absolute: 0.7 K/uL (ref 0.1–1.0)
Monocytes Relative: 6 %
Neutro Abs: 10.4 K/uL — ABNORMAL HIGH (ref 1.7–7.7)
Neutrophils Relative %: 86 %
Platelets: 82 K/uL — ABNORMAL LOW (ref 150–400)
RBC: 3.99 MIL/uL (ref 3.87–5.11)
RDW: 18.5 % — ABNORMAL HIGH (ref 11.5–15.5)
WBC: 12 K/uL — ABNORMAL HIGH (ref 4.0–10.5)
nRBC: 0 % (ref 0.0–0.2)

## 2024-07-01 LAB — BLOOD GAS, VENOUS
Acid-Base Excess: 2.5 mmol/L — ABNORMAL HIGH (ref 0.0–2.0)
Acid-base deficit: 0.2 mmol/L (ref 0.0–2.0)
Bicarbonate: 27.9 mmol/L (ref 20.0–28.0)
Bicarbonate: 24 mmol/L (ref 20.0–28.0)
O2 Saturation: 52.5 %
O2 Saturation: 73.5 %
Patient temperature: 37
Patient temperature: 37
pCO2, Ven: 45 mmHg (ref 44–60)
pCO2, Ven: 37 mmHg — ABNORMAL LOW (ref 44–60)
pH, Ven: 7.4 (ref 7.25–7.43)
pH, Ven: 7.42 (ref 7.25–7.43)
pO2, Ven: 37 mmHg (ref 32–45)
pO2, Ven: 46 mmHg — ABNORMAL HIGH (ref 32–45)

## 2024-07-01 LAB — AMMONIA: Ammonia: 15 umol/L (ref 9–35)

## 2024-07-01 LAB — CBG MONITORING, ED
Glucose-Capillary: 127 mg/dL — ABNORMAL HIGH (ref 70–99)
Glucose-Capillary: 59 mg/dL — ABNORMAL LOW (ref 70–99)
Glucose-Capillary: 112 mg/dL — ABNORMAL HIGH (ref 70–99)
Glucose-Capillary: 79 mg/dL (ref 70–99)

## 2024-07-01 LAB — GLUCOSE, CAPILLARY: Glucose-Capillary: 83 mg/dL (ref 70–99)

## 2024-07-01 LAB — MAGNESIUM: Magnesium: 2.1 mg/dL (ref 1.7–2.4)

## 2024-07-01 LAB — LACTIC ACID, PLASMA: Lactic Acid, Venous: 1.9 mmol/L (ref 0.5–1.9)

## 2024-07-01 MED ORDER — DEXTROSE 50 % IV SOLN
1.0000 | Freq: Once | INTRAVENOUS | Status: AC
  Administered 2024-07-01: 50 mL via INTRAVENOUS

## 2024-07-01 MED ORDER — ONDANSETRON HCL 4 MG PO TABS: 4.0000 mg | ORAL_TABLET | Freq: Four times a day (QID) | ORAL | Status: DC | PRN

## 2024-07-01 MED ORDER — ACETAMINOPHEN 650 MG RE SUPP: 650.0000 mg | Freq: Four times a day (QID) | RECTAL | Status: DC | PRN

## 2024-07-01 MED ORDER — OXYCODONE HCL 5 MG PO TABS
5.0000 mg | ORAL_TABLET | ORAL | Status: DC | PRN
  Administered 2024-07-02 (×2): 5 mg via ORAL
  Filled 2024-07-01 (×3): qty 1

## 2024-07-01 MED ORDER — FENTANYL CITRATE (PF) 50 MCG/ML IJ SOSY
12.5000 ug | PREFILLED_SYRINGE | INTRAMUSCULAR | Status: DC | PRN
  Administered 2024-07-02 – 2024-07-10 (×8): 12.5 ug via INTRAVENOUS
  Filled 2024-07-01 (×10): qty 1

## 2024-07-01 MED ORDER — POLYETHYLENE GLYCOL 3350 17 G PO PACK
17.0000 g | PACK | Freq: Every day | ORAL | Status: DC | PRN
  Administered 2024-07-05: 17 g via ORAL
  Filled 2024-07-01: qty 1

## 2024-07-01 MED ORDER — CHLORHEXIDINE GLUCONATE CLOTH 2 % EX PADS
6.0000 | MEDICATED_PAD | Freq: Every day | CUTANEOUS | Status: DC
  Administered 2024-07-02 – 2024-07-04 (×3): 6 via TOPICAL

## 2024-07-01 MED ORDER — APIXABAN 5 MG PO TABS
5.0000 mg | ORAL_TABLET | Freq: Two times a day (BID) | ORAL | Status: DC
  Administered 2024-07-01 – 2024-07-02 (×2): 5 mg via ORAL
  Filled 2024-07-01 (×2): qty 1

## 2024-07-01 MED ORDER — FUROSEMIDE 10 MG/ML IJ SOLN
40.0000 mg | Freq: Once | INTRAMUSCULAR | Status: AC
  Administered 2024-07-01: 40 mg via INTRAVENOUS
  Filled 2024-07-01: qty 4

## 2024-07-01 MED ORDER — ONDANSETRON HCL 4 MG/2ML IJ SOLN: 4.0000 mg | Freq: Four times a day (QID) | INTRAMUSCULAR | Status: DC | PRN

## 2024-07-01 MED ORDER — MIDODRINE HCL 5 MG PO TABS
10.0000 mg | ORAL_TABLET | Freq: Once | ORAL | Status: AC
  Administered 2024-07-01: 10 mg via ORAL
  Filled 2024-07-01: qty 2

## 2024-07-01 MED ORDER — PANTOPRAZOLE SODIUM 40 MG PO TBEC
40.0000 mg | DELAYED_RELEASE_TABLET | ORAL | Status: DC
  Administered 2024-07-02: 40 mg via ORAL
  Filled 2024-07-01 (×2): qty 1

## 2024-07-01 MED ORDER — ACETAMINOPHEN 325 MG PO TABS
650.0000 mg | ORAL_TABLET | Freq: Four times a day (QID) | ORAL | Status: DC | PRN
  Administered 2024-07-01 – 2024-07-20 (×8): 650 mg via ORAL
  Filled 2024-07-01 (×8): qty 2

## 2024-07-01 MED ORDER — VENLAFAXINE HCL 37.5 MG PO TABS
37.5000 mg | ORAL_TABLET | Freq: Two times a day (BID) | ORAL | Status: DC
  Administered 2024-07-02 – 2024-07-08 (×12): 37.5 mg via ORAL
  Filled 2024-07-01 (×15): qty 1

## 2024-07-01 MED ORDER — MIDODRINE HCL 5 MG PO TABS
10.0000 mg | ORAL_TABLET | Freq: Three times a day (TID) | ORAL | Status: DC
  Administered 2024-07-01 – 2024-07-08 (×19): 10 mg via ORAL
  Filled 2024-07-01 (×19): qty 2

## 2024-07-01 MED ORDER — LEVOTHYROXINE SODIUM 50 MCG PO TABS
75.0000 ug | ORAL_TABLET | Freq: Every day | ORAL | Status: DC
  Administered 2024-07-02 – 2024-07-08 (×6): 75 ug via ORAL
  Filled 2024-07-01: qty 1
  Filled 2024-07-01 (×5): qty 2

## 2024-07-01 NOTE — ED Notes (Signed)
 PT sleepy, NAD, calm.

## 2024-07-01 NOTE — ED Provider Notes (Signed)
 "  [facility] Provider Note    Event Date/Time   First MD Initiated Contact with Patient 07/01/24 1126     (approximate)   History   No chief complaint on file.   HPI  [name] is a 64 y.o. female  ESRD on HD (MWF), COPD, CHF, bipolar disorder, chronic hypotension on midodrine , HLD and prior CVA who comes in with weakness and fall.  I reviewed a note from 06/22/2024 for patient was admitted secondary to fluid overload, cardiogenic shock requiring Levophed  now weaned to home midodrine , syncope and collapse likely related to hypotension.  Patient has known EF of 25-30%.,  On Eliquis .  Patient denies any falls or hitting her head.  She reports that she just felt weak and, slid down.  She states that she did not hit her right wrist and does report some pain there.  She denies any chest pain, shortness of breath, abdominal pain or any other concerns.  She does report that she had dialysis yesterday but did not get a full session.  Per EMS she was just very weak to stand up.  Patient does report being on 4 L at baseline.  Patient states that she is not sure how she ended up on the ground.  According to the brother who is then at bedside he states that when she came in last time she was also found down on the ground.  He states that it made her worried because this is how she presented last time.  I reviewed the ER summary where patient was admitted last time had a CT head and neck that were negative given 500 cc of fluid but given she was in the 60 systolic she required pressors and admission to the ICU  Physical Exam   Triage Vital Signs: ED Triage Vitals  Encounter Vitals Group     BP      Girls Systolic BP Percentile      Girls Diastolic BP Percentile      Boys Systolic BP Percentile      Boys Diastolic BP Percentile      Pulse      Resp      Temp      Temp src      SpO2      Weight      Height      Head Circumference      Peak Flow      Pain  Score      Pain Loc      Pain Education      Exclude from Growth Chart     Most recent vital signs: Vitals:   07/01/24 1132  BP: (!) 116/55  Pulse: 88  Resp: 18  Temp: 98.2 F (36.8 C)  SpO2: 100%     General: Awake, no distress.  CV:  Good peripheral perfusion.  Resp:  Normal effort.  Patient on 4 L Abd:  No distention.  Soft and nontender Other:  Cool extremities No obvious pain in her extremities. Cranial nerves appear grossly intact.  Skin no evidence of trauma to the head.  No C-spine tenderness.  Full range of motion of neck.  No chest wall tenderness.  Dialysis catheter on the right chest wall. Patient does have a ulcer on the heel of her left foot.  She has got Doppler DP pulses bilaterally.  ED Results / Procedures / Treatments   Labs (all labs ordered are listed, but only abnormal results are  displayed) Labs Reviewed  CBC WITH DIFFERENTIAL/PLATELET - Abnormal; Notable for the following components:      Result Value   WBC 12.0 (*)    RDW 18.5 (*)    Platelets 82 (*)    Neutro Abs 10.4 (*)    Lymphs Abs 0.5 (*)    Abs Immature Granulocytes 0.19 (*)    All other components within normal limits  COMPREHENSIVE METABOLIC PANEL WITH GFR - Abnormal; Notable for the following components:   Chloride 92 (*)    CO2 21 (*)    BUN 28 (*)    Creatinine, Ser 5.07 (*)    Calcium  10.4 (*)    GFR, Estimated 9 (*)    Anion gap 26 (*)    All other components within normal limits  TROPONIN T, HIGH SENSITIVITY - Abnormal; Notable for the following components:   Troponin T High Sensitivity 155 (*)    All other components within normal limits  LACTIC ACID, PLASMA  TROPONIN T, HIGH SENSITIVITY     EKG  My interpretation of EKG:  Sinus rate of 85 without any ST elevation, patient does have right bundle branch block and I did review prior EKG and she has got similar T wave inversions in V2 V3.  RADIOLOGY I have reviewed the xray personally and interpreted no evidence of  pelvic fracture   PROCEDURES:  Critical Care performed: No  .1-3 Lead EKG Interpretation  Performed by: Ernest Ronal BRAVO, MD Authorized by: Ernest Ronal BRAVO, MD     Interpretation: normal     ECG rate:  60   ECG rate assessment: normal     Rhythm: sinus rhythm     Ectopy: PVCs     Conduction: normal      MEDICATIONS ORDERED IN ED: Medications  Chlorhexidine  Gluconate Cloth 2 % PADS 6 each (has no administration in time range)  acetaminophen  (TYLENOL ) tablet 650 mg (has no administration in time range)    Or  acetaminophen  (TYLENOL ) suppository 650 mg (has no administration in time range)  oxyCODONE  (Oxy IR/ROXICODONE ) immediate release tablet 5 mg (has no administration in time range)  fentaNYL  (SUBLIMAZE ) injection 12.5 mcg (has no administration in time range)  polyethylene glycol (MIRALAX  / GLYCOLAX ) packet 17 g (has no administration in time range)  ondansetron  (ZOFRAN ) tablet 4 mg (has no administration in time range)    Or  ondansetron  (ZOFRAN ) injection 4 mg (has no administration in time range)  apixaban  (ELIQUIS ) tablet 5 mg (has no administration in time range)  levothyroxine  (SYNTHROID ) tablet 75 mcg (has no administration in time range)  midodrine  (PROAMATINE ) tablet 10 mg (has no administration in time range)  venlafaxine  (EFFEXOR ) tablet 37.5 mg (has no administration in time range)  pantoprazole  (PROTONIX ) EC tablet 40 mg (has no administration in time range)  midodrine  (PROAMATINE ) tablet 10 mg (10 mg Oral Given 07/01/24 1503)  dextrose  50 % solution 50 mL (50 mLs Intravenous Given 07/01/24 1605)     IMPRESSION / MDM / ASSESSMENT AND PLAN / ED COURSE  I reviewed the triage vital signs and the nursing notes.   Patient's presentation is most consistent with acute presentation with potential threat to life or bodily function.   Will get workup to evaluate for causes of weakness such as Electra abnormalities, AKI, edema on her chest x-ray.  Patient is adamant that  she did not hit her head.  However family member then reports that she was not even sure how she ended up on the  ground and she is on Eliquis  therefore the concern was for potential syncopal episode again.  Therefore I did order a CT head and neck.  Pelvic x-ray is negative.  Right wrist x-ray negative  IMPRESSION: 1. Pulmonary interstitial prominence with mild pulmonary edema, similar . 2. Decreased left pleural effusion. 3. Cardiomegaly.  Patient troponin is elevated but similar to her prior.  Her CBC shows elevated white count but she really does not have any other infectious symptoms lactate was normal.  Her CMP shows an elevated anion gap of 26.  Given her bifascicular block I did discuss with Dr. Florencio and he does not feel like patient would benefit from pacemaker but can follow-up outpatient.  He does not feel a syncope is cardiac in nature   4:04 PM patient's glucose this was low at 70 on CMP and I went to go reevaluate patient and she was less responsive so we got up point-of-care glucose and it was 58.  She is not safe to take anything by mouth therefore she was given 1 amp of dextrose .  After the dextrose  patient does wake up more.  She is able to wake up and move all extremities do not see any evidence of obvious stroke.  I very placed patient up for admission I did order a VBG, ammonia level.  Patient was able to talk some to the hospitalist as well.  Do not see any indication for repeat imaging of her head as she already had a CT head that was negative and I do not see a focal deficits to suggest stroke.  Patient will be admitted to the hospital team pending these results.  Patient did have pain over her snuffbox on her right hand she had good distal pulse.  Do not feel that she would tolerate thumb spica splint that is not removable therefore we will place her in a removable thumb spica  and she will follow-up for repeat x-ray in 1 week  I did discuss case with dialysis and they  will get her dialysis tomorrow  The patient is on the cardiac monitor to evaluate for evidence of arrhythmia and/or significant heart rate changes.      FINAL CLINICAL IMPRESSION(S) / ED DIAGNOSES   Final diagnoses:  Fall, initial encounter  Right wrist pain  Hypoglycemia  Acute pulmonary edema (HCC)  Acidosis  ESRD (end stage renal disease) on dialysis [facility])     Rx / DC Orders   ED Discharge Orders     None        Note:  This document was prepared using Dragon voice recognition software and may include unintentional dictation errors.   Ernest Ronal BRAVO, MD 07/01/24 1651  "

## 2024-07-01 NOTE — Progress Notes (Signed)
 Referring Provider: No ref. provider found Primary Care Physician:  Orlean Alan HERO, FNP Primary Nephrologist:  Dr.   [place] for Consultation: ESRD  HPI: 64 year old female with history of COPD, atrial fibrillation, bipolar disorder, end-stage renal disease on hemodialysis Monday Wednesday and Friday now admitted with history of fall.  She had partial dialysis treatment yesterday.  Patient's son says she always has been hypotensive.  Past Medical History:  Diagnosis Date   Acute lacunar infarction (HCC) 03/03/2014   a.) MRI brain 03/03/2014: two acute punctate lacunar infarcts anteriorly in the BILATERAL centrum semiovale   Adult behavior problems    Anemia of chronic renal failure    Aortic atherosclerosis    Arthritis    Atrial fibrillation and flutter (HCC)    a.) CHA2DS2VASc = 6 (sex, HFrEF, HTN, CVA x 2, prior MI/vascular disease) as of 02/04/2024; b.) rate/rhythm maintained on oral metoprolol  succinate; chronically anticoagulated with apixaban    Bipolar disorder (HCC)    Cardiomegaly    Cerebral microvascular disease    CHF (congestive heart failure) (HCC)    Chicken pox    Cholelithiasis    Chronic respiratory failure with hypoxia (HCC) 12/19/2021   COPD (chronic obstructive pulmonary disease) (HCC)    Coronary artery disease    DDD (degenerative disc disease), cervical    DDD (degenerative disc disease), thoracolumbar    Diverticulosis    Dyspnea    ESRD (end stage renal disease) on dialysis (M,W,F)    GERD (gastroesophageal reflux disease)    Gout    HCV (hepatitis C virus)    Headache    HFrEF (heart failure with reduced ejection fraction) (HCC)    History of delirium 04/12/2014   History of substance abuse (HCC)    a.) tobacco + cocaine   Hyperkalemia 11/13/2014   Hyperlipidemia    Hypertension    Hypotension 02/24/2014   Hypothyroidism    Ischemic cardiomyopathy    Nose colonized with MRSA 02/01/2024   a.) presurgical PCR (+) on 02/01/2024 prior to A/V  FISTULAGRAM; b.) presurgical PCR (+) on 03/07/2024 prior to AV FISTULA CREATION   NSTEMI (non-ST elevated myocardial infarction) (HCC) 10/08/2013   NSTEMI (non-ST elevated myocardial infarction) (HCC) 01/23/2002   a.) LHC 01/24/2002 --> multi-vessel CAD with IRA being 90% OM2 --> delayed PCI until 01/25/2002 --> 3.0 x 23 mm BX Velocity stent   Obesity    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    On apixaban  therapy    Peripheral vascular disease    Pneumonia    Postoperative anemia due to acute blood loss 02/27/2014   Postoperative cerebrovascular infarction following cardiac surgery 02/21/2014   a.) developed RIGHT upper/lower extremity weakness following cardiac revascularization --> neuro consult and head CT --> Hypodensity noted in the area of the LEFT motor cortex consistent with a component of acute ischemia   Pulmonary hypertension (HCC)    RBBB (right bundle branch block)    Renal osteodystrophy    S/P CABG x 3 02/20/2014   a.) LIMA-LAD, SVG-OM1, SVG-PDA   Stroke (HCC) 05/23/2010   a.) brain MRI 05/23/2010: BILATERAL acute infarcts and old prior infarcts; RIGHT frontal lobe extending into the white matter. Small focus of restricted diffusion in the cortex of the medial LEFT frontoparietal lobe. Periventricular T2 and multiple foci of T2 hyperintensity in the subcortical white matter and increased FLAIR signal in the cortex of the LEFT frontal lobe.   Syncope and collapse 07/25/2020    Past Surgical History:  Procedure Laterality Date  A/V FISTULAGRAM Left 03/30/2019   Procedure: A/V FISTULAGRAM;  Surgeon: Marea Selinda RAMAN, MD;  Location: [facility];  Service: Cardiovascular;  Laterality: Left;   A/V FISTULAGRAM Left 10/06/2019   Procedure: A/V FISTULAGRAM;  Surgeon: Marea Selinda RAMAN, MD;  Location: [facility];  Service: Cardiovascular;  Laterality: Left;   A/V FISTULAGRAM Left 05/19/2021   Procedure: A/V FISTULAGRAM;  Surgeon: Marea Selinda RAMAN, MD;  Location:  [facility];  Service: Cardiovascular;  Laterality: Left;   A/V FISTULAGRAM Left 01/29/2022   Procedure: A/V Fistulagram;  Surgeon: Marea Selinda RAMAN, MD;  Location: [facility];  Service: Cardiovascular;  Laterality: Left;   A/V FISTULAGRAM Left 04/30/2022   Procedure: A/V Fistulagram;  Surgeon: Marea Selinda RAMAN, MD;  Location: [facility];  Service: Cardiovascular;  Laterality: Left;   A/V FISTULAGRAM Left 08/20/2022   Procedure: A/V Fistulagram;  Surgeon: Marea Selinda RAMAN, MD;  Location: [facility];  Service: Cardiovascular;  Laterality: Left;   A/V FISTULAGRAM N/A 12/03/2022   Procedure: A/V Fistulagram;  Surgeon: Marea Selinda RAMAN, MD;  Location: [facility];  Service: Cardiovascular;  Laterality: N/A;   A/V FISTULAGRAM Left 02/11/2023   Procedure: A/V Fistulagram;  Surgeon: Marea Selinda RAMAN, MD;  Location: [facility];  Service: Cardiovascular;  Laterality: Left;   A/V FISTULAGRAM Left 02/07/2024   Procedure: A/V Fistulagram;  Surgeon: Marea Selinda RAMAN, MD;  Location: [facility];  Service: Cardiovascular;  Laterality: Left;   COLONOSCOPY     COLONOSCOPY WITH PROPOFOL  N/A 04/22/2021   Procedure: COLONOSCOPY WITH PROPOFOL ;  Surgeon: Maryruth Ole DASEN, MD;  Location: [facility];  Service: Endoscopy;  Laterality: N/A;   CORONARY ANGIOPLASTY WITH STENT PLACEMENT Left 01/25/2002   Procedure: CORONARY ANGIOPLASTY WITH STENT PLACEMENT; Location: [facility]; Surgeon: Margie Lovelace, MD   CORONARY ARTERY BYPASS GRAFT N/A 02/20/2014   Procedure: CORONARY ARTERY BYPASS GRAFT; Location: [facility]; Surgeon: Reyes Fruits, MD   DIALYSIS FISTULA CREATION     DIALYSIS/PERMA CATHETER INSERTION N/A 04/08/2023   Procedure: DIALYSIS/PERMA CATHETER INSERTION;  Surgeon: Marea Selinda RAMAN, MD;  Location: [facility];  Service: Cardiovascular;  Laterality: N/A;   DIALYSIS/PERMA CATHETER REMOVAL N/A 07/06/2023   Procedure: DIALYSIS/PERMA CATHETER REMOVAL;  Surgeon: [name] Cordella MATSU, MD;  Location: [facility];  Service: Cardiovascular;  Laterality: N/A;   ESOPHAGOGASTRODUODENOSCOPY     ESOPHAGOGASTRODUODENOSCOPY N/A 04/22/2021   Procedure: ESOPHAGOGASTRODUODENOSCOPY (EGD);  Surgeon: Maryruth Ole DASEN, MD;  Location: [facility];  Service: Endoscopy;  Laterality: N/A;   FLEXIBLE SIGMOIDOSCOPY N/A 02/23/2019   Procedure: FLEXIBLE SIGMOIDOSCOPY;  Surgeon: Gaylyn Gladis PENNER, MD;  Location: [facility] ENDOSCOPY;  Service: Endoscopy;  Laterality: N/A;   IABP INSERTION  02/20/2014   Procedure: INTRA-AORTIC BALLOON PUMP INSERTION; Location: [facility]; Surgeon: Reyes Fruits, MD   INSERTION OF ARTERIOVENOUS (AV) ARTEGRAFT ARM Right 03/09/2024   Procedure: INSERTION, GRAFT, ARTERIOVENOUS, UPPER EXTREMITY;  Surgeon: Marea Selinda RAMAN, MD;  Location: [facility];  Service: Vascular;  Laterality: Right;  BRACHIAL AXILLARY   INSERTION OF DIALYSIS CATHETER N/A 04/20/2024   Procedure: INSERTION OF DIALYSIS CATHETER;  Surgeon: Marea Selinda RAMAN, MD;  Location: [facility];  Service: Vascular;  Laterality: N/A;   LEFT HEART CATH AND CORONARY ANGIOGRAPHY Left 01/24/2002   Procedure: LEFT HEART CATH AND CORONARY ANGIOGRAPHY; Location: [facility]; Surgeon: Margie Lovelace, MD   LEFT HEART CATH AND CORONARY ANGIOGRAPHY Left 05/25/2012   Procedure: LEFT HEART CATH AND CORONARY ANGIOGRAPHY; Location: [facility]; Surgeon:  Margie Lovelace, MD   LEFT HEART CATH AND CORONARY ANGIOGRAPHY Left 10/09/2013   Procedure: LEFT HEART CATH AND CORONARY ANGIOGRAPHY; Location: [facility]; Surgeon: Vinie Jude, MD   LEFT HEART CATH AND CORS/GRAFTS ANGIOGRAPHY N/A 08/08/2018   Procedure: LEFT HEART CATH AND CORS/GRAFTS ANGIOGRAPHY;  Surgeon: Fernand Denyse LABOR, MD;  Location: [facility];  Service: Cardiovascular;  Laterality: N/A;   LEFT HEART CATH AND CORS/GRAFTS ANGIOGRAPHY N/A 01/30/2019   Procedure: LEFT HEART CATH AND CORS/GRAFTS ANGIOGRAPHY;  Surgeon: Fernand Denyse LABOR, MD;  Location: [facility];  Service:  Cardiovascular;  Laterality: N/A;   LIGATION ARTERIOVENOUS GORTEX GRAFT Right 04/20/2024   Procedure: LIGATION ARTERIOVENOUS GORTEX GRAFT;  Surgeon: Marea Selinda RAMAN, MD;  Location: [facility];  Service: Vascular;  Laterality: Right;   LOWER EXTREMITY ANGIOGRAPHY Left 04/24/2024   Procedure: Lower Extremity Angiography;  Surgeon: Marea Selinda RAMAN, MD;  Location: [facility];  Service: Cardiovascular;  Laterality: Left;   ultrasound guided pericardiocentesis     UPPER EXTREMITY ANGIOGRAPHY Right 03/30/2024   Procedure: Upper Extremity Angiography;  Surgeon: Marea Selinda RAMAN, MD;  Location: [facility];  Service: Cardiovascular;  Laterality: Right;    Prior to Admission medications  Medication Sig Start Date End Date Taking? Authorizing Provider  albuterol  (VENTOLIN  HFA) 108 (90 Base) MCG/ACT inhaler Inhale 1-2 puffs into the lungs every 6 (six) hours as needed for wheezing or shortness of breath. 01/04/24  Yes Orlean Alan HERO, FNP  apixaban  (ELIQUIS ) 5 MG TABS tablet Take 5 mg by mouth 2 (two) times daily. 09/11/19  Yes [provider]  clobetasol cream (TEMOVATE) 0.05 % Apply 1 Application topically daily. 06/16/24  Yes [provider]  hydrOXYzine  (ATARAX /VISTARIL ) 50 MG tablet Take 50 mg by mouth 2 (two) times daily.   Yes [provider]  levothyroxine  (SYNTHROID ) 75 MCG tablet Take 75 mcg by mouth daily before breakfast.  11/02/18  Yes [provider]  lidocaine -prilocaine  (EMLA ) cream Apply 1 Application topically once. 01/22/21  Yes [provider]  [Paused] metoprolol  succinate (TOPROL -XL) 25 MG 24 hr tablet Take 1 tablet by mouth every morning. Wait to take this until your doctor or other care provider tells you to start again. 08/27/23 08/26/24 Yes [provider]  midodrine  (PROAMATINE ) 10 MG tablet Take 1 tablet (10 mg total) by mouth 3 (three) times daily. 06/27/24  Yes Ponnala, Shruthi, MD  oxyCODONE -acetaminophen  (PERCOCET) 5-325 MG  tablet Take 1 tablet by mouth every 6 (six) hours as needed for severe pain (pain score 7-10). 06/27/24  Yes Ponnala, Shruthi, MD  pantoprazole  (PROTONIX ) 40 MG tablet Take 1 tablet (40 mg total) by mouth every morning. 06/27/24  Yes Ponnala, Shruthi, MD  colchicine  0.6 MG tablet Take 1 tablet (0.6 mg total) by mouth once for 1 dose. Patient not taking: Reported on 05/23/2024 04/13/24 05/19/24  Scoggins, Amber, NP  pregabalin (LYRICA) 50 MG capsule Take 50 mg by mouth daily. Patient not taking: Reported on 06/23/2024    [provider]  ranolazine  (RANEXA ) 1000 MG SR tablet Take 1,000 mg by mouth 2 (two) times daily. Patient not taking: Reported on 06/23/2024 03/18/23   [provider]  rosuvastatin  (CRESTOR ) 20 MG tablet Take 20 mg by mouth at bedtime. Patient not taking: Reported on 06/23/2024 11/07/21   [provider]  sevelamer  carbonate (RENVELA ) 800 MG tablet Take 3,200 mg by mouth 3 (three) times daily with meals. Patient not taking: Reported on 06/23/2024    [provider]  triamcinolone  cream (KENALOG ) 0.1 % Apply 1 Application topically 2 (two) times daily. Patient not taking: Reported on 06/23/2024 11/02/23   Orlean Alan HERO, FNP  venlafaxine  (EFFEXOR ) 37.5 MG tablet Take 37.5 mg by mouth 2 (two) times daily with a meal. Patient not taking: Reported on 06/23/2024    [provider]    Current Facility-Administered Medications  Medication Dose Route Frequency Provider Last Rate Last Admin   acetaminophen  (TYLENOL ) tablet 650 mg  650 mg Oral Q6H PRN Paudel, Keshab, MD       Or   acetaminophen  (TYLENOL ) suppository 650 mg  650 mg Rectal Q6H PRN Roann Gouty, MD       apixaban  (ELIQUIS ) tablet 5 mg  5 mg Oral BID Paudel, Keshab, MD       [START ON 07/02/2024] Chlorhexidine  Gluconate Cloth 2 % PADS 6 each  6 each Topical Q0600 Kyndal Gloster, MD       fentaNYL  (SUBLIMAZE ) injection 12.5 mcg  12.5 mcg Intravenous Q2H PRN Paudel, Keshab, MD       [START  ON 07/02/2024] levothyroxine  (SYNTHROID ) tablet 75 mcg  75 mcg Oral Q0600 Paudel, Keshab, MD       midodrine  (PROAMATINE ) tablet 10 mg  10 mg Oral TID WC Paudel, Keshab, MD   10 mg at 07/01/24 1828   ondansetron  (ZOFRAN ) tablet 4 mg  4 mg Oral Q6H PRN Roann Gouty, MD       Or   ondansetron  (ZOFRAN ) injection 4 mg  4 mg Intravenous Q6H PRN Roann Gouty, MD       oxyCODONE  (Oxy IR/ROXICODONE ) immediate release tablet 5 mg  5 mg Oral Q4H PRN Paudel, Keshab, MD       [START ON 07/02/2024] pantoprazole  (PROTONIX ) EC tablet 40 mg  40 mg Oral BH-q7a Paudel, Keshab, MD       polyethylene glycol (MIRALAX  / GLYCOLAX ) packet 17 g  17 g Oral Daily PRN Paudel, Gouty, MD       venlafaxine  (EFFEXOR ) tablet 37.5 mg  37.5 mg Oral BID WC Paudel, Keshab, MD       Facility-Administered Medications Ordered in Other Encounters  Medication Dose Route Frequency Provider Last Rate Last Admin   heparin  injection    PRN Marea Selinda RAMAN, MD   10,000 Units at 04/08/23 1412    Allergies as of 07/01/2024 - Review Complete 07/01/2024  Allergen Reaction Noted   Morphine  and codeine Hives 05/29/2013   Levaquin [levofloxacin] Itching and Other (See Comments) 04/21/2018   Enalapril  Other (See Comments) 10/08/2022   Latex Rash 05/29/2013   Tape Rash and Other (See Comments) 12/04/2013    Family History  Problem Relation Age of Onset   Hypertension Mother    Ovarian cancer Mother    Diabetes type II Mother    Hypertension Father    Kidney disease Father    Hypertension Sister    Diabetes type II Maternal Grandmother    Breast cancer Maternal Grandmother    Breast cancer Maternal Aunt    Hypertension Other    Cancer Other    Renal Disease Other     Social History   Socioeconomic History   Marital status: Single    Spouse name: Not on file   Number of children: Not on file   Years of education: Not on file   Highest education level: Not on file  Occupational History   Not on file  Tobacco Use   Smoking  status: Every Day    Current  packs/day: 0.25    Average packs/day: 0.3 packs/day for 20.0 years (5.0 ttl pk-yrs)    Types: Cigarettes   Smokeless tobacco: Never   Tobacco comments:    smokes 1 - 1.5 cigarettes a day  Vaping Use   Vaping status: Never Used  Substance and Sexual Activity   Alcohol use: No    Comment: social   Drug use: No   Sexual activity: Not Currently    Birth control/protection: Post-menopausal  Other Topics Concern   Not on file  Social History Narrative   Lives alone   Social Drivers of Health   Tobacco Use: High Risk (07/01/2024)   Patient History    Smoking Tobacco Use: Every Day    Smokeless Tobacco Use: Never    Passive Exposure: Not on file  Financial Resource Strain: Low Risk  (05/31/2024)   Received from [facility]   Overall Financial Resource Strain (CARDIA)    Difficulty of Paying Living Expenses: Not hard at all  Food Insecurity: No Food Insecurity (06/23/2024)   Epic    Worried About Radiation Protection Practitioner of Food in the Last Year: Never true    Ran Out of Food in the Last Year: Never true  Transportation Needs: No Transportation Needs (06/23/2024)   Epic    Lack of Transportation (Medical): No    Lack of Transportation (Non-Medical): No  Physical Activity: Not on file  Stress: Not on file  Social Connections: Not on file  Intimate Partner Violence: Not At Risk (06/23/2024)   Epic    Fear of Current or Ex-Partner: No    Emotionally Abused: No    Physically Abused: No    Sexually Abused: No  Depression (PHQ2-9): Low Risk (07/06/2023)   Depression (PHQ2-9)    PHQ-2 Score: 3  Alcohol Screen: Not on file  Housing: Low Risk (06/23/2024)   Epic    Unable to Pay for Housing in the Last Year: No    Number of Times Moved in the Last Year: 0    Homeless in the Last Year: No  Utilities: Not At Risk (06/23/2024)   Epic    Threatened with loss of utilities: No  Health Literacy: Not on file    Physical Exam: Vital signs in last 24  hours: Temp:  [98.2 F (36.8 C)-100 F (37.8 C)] 100 F (37.8 C) (01/17 2005) Pulse Rate:  [72-115] 72 (01/17 2005) Resp:  [13-30] 18 (01/17 2005) BP: (100-129)/(52-104) 119/104 (01/17 2005) SpO2:  [83 %-100 %] 83 % (01/17 2005) FiO2 (%):  [100 %] 100 % (01/17 1700) Weight:  [70.5 kg] 70.5 kg (01/17 1132)   General:   Alert,  Well-developed, well-nourished, pleasant and cooperative in NAD Head:  Normocephalic and atraumatic. Eyes:  Sclera clear, no icterus.   Conjunctiva pink. Ears:  Normal auditory acuity. Nose:  No deformity, discharge,  or lesions. Lungs:  Clear throughout to auscultation.   No wheezes, crackles, or rhonchi. No acute distress. Heart:  Regular rate and rhythm; no murmurs, clicks, rubs,  or gallops. Abdomen:  Soft, nontender and nondistended. No masses, hepatosplenomegaly or hernias noted. Normal bowel sounds, without guarding, and without rebound.   Extremities:  Without clubbing or edema.  Intake/Output from previous day: No intake/output data recorded. Intake/Output this shift: No intake/output data recorded.  Lab Results: Recent Labs    07/01/24 1336  WBC 12.0*  HGB 12.7  HCT 38.7  PLT 82*   BMET Recent Labs    07/01/24 1336  NA 138  K 4.0  CL 92*  CO2 21*  GLUCOSE 70  BUN 28*  CREATININE 5.07*  CALCIUM  10.4*   LFT Recent Labs    07/01/24 1336  PROT 7.2  ALBUMIN  3.8  AST 36  ALT 15  ALKPHOS 71  BILITOT 1.2   PT/INR No results for input(s): LABPROT, INR in the last 72 hours. Hepatitis Panel No results for input(s): HEPBSAG, HCVAB, HEPAIGM, HEPBIGM in the last 72 hours.  Studies/Results: CT Cervical Spine Wo Contrast Result Date: 07/01/2024 EXAM: CT CERVICAL SPINE WITHOUT CONTRAST 07/01/2024 03:13:30 PM TECHNIQUE: CT of the cervical spine was performed without the administration of intravenous contrast. Multiplanar reformatted images are provided for review. Automated exposure control, iterative reconstruction, and/or  weight based adjustment of the mA/kV was utilized to reduce the radiation dose to as low as reasonably achievable. COMPARISON: 06/22/2024 and 02/16/2022 CLINICAL HISTORY: Ataxia, cervical trauma FINDINGS: BONES AND ALIGNMENT: No acute fracture or traumatic malalignment. Focal kyphosis at C5-C6 is stable. Straightening of the normal cervical lordosis otherwise is stable. DEGENERATIVE CHANGES: Chronic endplate changes at C5-C6. SOFT TISSUES: Adjacent soft tissues are unremarkable. Calcifications are again seen within the thyroid  bilaterally. Atherosclerotic calcifications are present in the proximal great vessels and at the carotid bifurcations without definite stenosis. A right IJ catheter is in place. No prevertebral soft tissue swelling. IMPRESSION: 1. No acute cervical spine findings. 2. Stable chronic endplate changes and focal kyphosis at C5-6. Electronically signed by: Lonni Necessary MD 07/01/2024 03:57 PM EST RP Workstation: [id number]   CT HEAD WO CONTRAST ( ) Result Date: 07/01/2024 EXAM: CT HEAD WITHOUT CONTRAST 07/01/2024 03:13:30 PM TECHNIQUE: CT of the head was performed without the administration of intravenous contrast. Automated exposure control, iterative reconstruction, and/or weight based adjustment of the mA/kV was utilized to reduce the radiation dose to as low as reasonably achievable. COMPARISON: 06/22/2024 CLINICAL HISTORY: Head trauma, abnormal mental status (Age 54-64y). Fall today. FINDINGS: BRAIN AND VENTRICLES: No acute hemorrhage. No evidence of acute infarct. Chronic encephalomalacia within bilateral frontal, parietal, and right occipital lobes compatible with remote infarcts. Extensive periventricular and subcortical white matter low-density changes compatible with chronic microvascular ischemic change. No hydrocephalus. No extra-axial collection. No mass effect or midline shift. ORBITS: No acute abnormality. SINUSES: No acute abnormality. SOFT TISSUES AND SKULL: No acute  soft tissue abnormality. No skull fracture. Atherosclerotic calcifications in large vessels of skull base. IMPRESSION: 1. No acute intracranial abnormality. 2. Chronic encephalomalacia within bilateral frontal, parietal, and right occipital lobes compatible with remote infarcts. 3. Extensive periventricular and subcortical white matter low-density changes compatible with chronic microvascular ischemic change. Electronically signed by: Lonni Necessary MD 07/01/2024 03:54 PM EST RP Workstation: [id number]   DG Wrist Complete Right Result Date: 07/01/2024 EXAM: 3 or more VIEW(S) XRAY OF THE RIGHT WRIST 07/01/2024 12:22:33 PM COMPARISON: None available. CLINICAL HISTORY: fall FINDINGS: BONES AND JOINTS: No acute fracture. No malalignment. Subchondral cysts in the base of the first metacarpal bone with degenerative changes of the first [facility] joint. SOFT TISSUES: Unremarkable. IMPRESSION: 1. No acute fracture or dislocation. Electronically signed by: Waddell Calk MD 07/01/2024 12:51 PM EST RP Workstation: [id number]   DG Pelvis Portable Result Date: 07/01/2024 EXAM: 1 or 2 VIEW(S) XRAY OF THE PELVIS 07/01/2024 12:22:33 PM COMPARISON: 01/13/2016 CLINICAL HISTORY: fall FINDINGS: BONES AND JOINTS: No acute fracture. No malalignment. SOFT TISSUES: Likely calcified uterine fibroids. Severe atherosclerotic plaque. IMPRESSION: 1. No evidence of acute traumatic injury. Electronically signed by: Waddell Calk MD 07/01/2024 12:49 PM EST RP Workstation: [id number]  DG Chest Portable 1 View Result Date: 07/01/2024 EXAM: 1 VIEW(S) XRAY OF THE CHEST 07/01/2024 11:45:00 AM COMPARISON: 06/25/2024 CLINICAL HISTORY: SOB FINDINGS: LINES, TUBES AND DEVICES: Right chest wall dialysis catheter in place with tip overlying right atrium. Left brachial stent noted. LUNGS AND PLEURA: Pulmonary interstitial prominence with mild pulmonary edema. Decreased left pleural effusion. No pneumothorax. HEART AND MEDIASTINUM: Cardiomegaly.  Coronary artery calcifications and stents. Surgical changes in mediastinum. Aortic calcification. BONES AND SOFT TISSUES: Intact sternotomy wires. IMPRESSION: 1. Pulmonary interstitial prominence with mild pulmonary edema, similar . 2. Decreased left pleural effusion. 3. Cardiomegaly. Electronically signed by: Waddell Calk MD 07/01/2024 11:55 AM EST RP Workstation: [id number]    Assessment/Plan:  64 year old female with history of COPD, atrial fibrillation, bipolar disorder, end-stage renal disease on hemodialysis Monday Wednesday and Friday now admitted with history of fall.  She had partial dialysis treatment yesterday.  Patient's son says she always has been hypotensive.  Chest x-ray shows fluid overload.    ESRD: Will schedule for dialysis tonight if possible with fluid removal.  If not we will dialyze her tomorrow morning.  Spoke to the patient's son at bedside.  ANEMIA: Will continue to monitor closely.  MBD: Will check PTH, calcium  and phosphorus levels.  HTN/VOL: Blood pressure is stable today.  Chest x-ray shows congestive heart failure.  Will give a dose of Lasix  today.  Labs and medications reviewed. Will continue to monitor closely.    LOS: 0 Pinkey Edman, MD [facility] @TODAY @8 :52 PM

## 2024-07-01 NOTE — ED Notes (Addendum)
 Sleepy, NAD, calm, swallowed meds w/o difficulty. Taken to CT via stretcher on monitor. VSS.

## 2024-07-01 NOTE — H&P (Signed)
 "  History and Physical    [name] FMW:[id number] DOB: 02-27-1961 DOA: 07/01/2024  DOS: the patient was seen and examined on 07/01/2024  PCP: Orlean Alan HERO, FNP   Patient coming from: Home  I have personally briefly reviewed patient's old medical records in [facility]  Chief Complaint: Found down on the floor by her brother at 9:30 AM  HPI: [name] is a pleasant 64 y.o. female with medical history significant for ESRD on hemodialysis MWF, COPD on home oxygen  2 L via nasal cannula, HFrEF with last EF 25 to 30%, A-fib on Eliquis ,, bipolar disorder, chronic hypotension on midodrine , HLD, prior history of stroke who was brought into ED when she was found down at her house floor by the brother when he went to check this morning around 9:30 AM.  Patient had a similar admission on 06/22/2024 due to fluid overload, cardiogenic shock requiring Levophed  then placed on midodrine , had a syncope and collapse at that time due to hypotension.  She had hemodialysis Friday but she could not complete due to unknown reason. Patient is alert awake and oriented but falls back to sleep when talking.  She denies any fever, chills, cough, nausea vomiting abdominal pain chest pain shortness of breath palpitations.  She complains of pain on the right wrist and she has a wound on the left heel.    ED Course: Upon arrival to the ED, patient is found to be sleepy but arousable and conversive normally, chest x-ray showed possible interstitial prominences, pelvic x-ray was negative, wrist x-ray of the right hand was negative for fractures, EKG showed sinus rhythm with right bundle branch block, bicarb was 21, anion gap of 26, white count of 12, troponin around 155 almost around at baseline, CT brain and cervical spine without any fractures or bleeding.  Hospitalist service was consulted for evaluation for admission for fall and lethargy.  Review of Systems:  ROS  All other systems negative except as  noted in the HPI.  Past Medical History:  Diagnosis Date   Acute lacunar infarction (HCC) 03/03/2014   a.) MRI brain 03/03/2014: two acute punctate lacunar infarcts anteriorly in the BILATERAL centrum semiovale   Adult behavior problems    Anemia of chronic renal failure    Aortic atherosclerosis    Arthritis    Atrial fibrillation and flutter (HCC)    a.) CHA2DS2VASc = 6 (sex, HFrEF, HTN, CVA x 2, prior MI/vascular disease) as of 02/04/2024; b.) rate/rhythm maintained on oral metoprolol  succinate; chronically anticoagulated with apixaban    Bipolar disorder (HCC)    Cardiomegaly    Cerebral microvascular disease    CHF (congestive heart failure) (HCC)    Chicken pox    Cholelithiasis    Chronic respiratory failure with hypoxia (HCC) 12/19/2021   COPD (chronic obstructive pulmonary disease) (HCC)    Coronary artery disease    DDD (degenerative disc disease), cervical    DDD (degenerative disc disease), thoracolumbar    Diverticulosis    Dyspnea    ESRD (end stage renal disease) on dialysis (M,W,F)    GERD (gastroesophageal reflux disease)    Gout    HCV (hepatitis C virus)    Headache    HFrEF (heart failure with reduced ejection fraction) (HCC)    History of delirium 04/12/2014   History of substance abuse (HCC)    a.) tobacco + cocaine   Hyperkalemia 11/13/2014   Hyperlipidemia    Hypertension    Hypotension 02/24/2014   Hypothyroidism  Ischemic cardiomyopathy    Nose colonized with MRSA 02/01/2024   a.) presurgical PCR (+) on 02/01/2024 prior to A/V FISTULAGRAM; b.) presurgical PCR (+) on 03/07/2024 prior to AV FISTULA CREATION   NSTEMI (non-ST elevated myocardial infarction) (HCC) 10/08/2013   NSTEMI (non-ST elevated myocardial infarction) (HCC) 01/23/2002   a.) LHC 01/24/2002 --> multi-vessel CAD with IRA being 90% OM2 --> delayed PCI until 01/25/2002 --> 3.0 x 23 mm BX Velocity stent   Obesity    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    On apixaban   therapy    Peripheral vascular disease    Pneumonia    Postoperative anemia due to acute blood loss 02/27/2014   Postoperative cerebrovascular infarction following cardiac surgery 02/21/2014   a.) developed RIGHT upper/lower extremity weakness following cardiac revascularization --> neuro consult and head CT --> Hypodensity noted in the area of the LEFT motor cortex consistent with a component of acute ischemia   Pulmonary hypertension (HCC)    RBBB (right bundle branch block)    Renal osteodystrophy    S/P CABG x 3 02/20/2014   a.) LIMA-LAD, SVG-OM1, SVG-PDA   Stroke (HCC) 05/23/2010   a.) brain MRI 05/23/2010: BILATERAL acute infarcts and old prior infarcts; RIGHT frontal lobe extending into the white matter. Small focus of restricted diffusion in the cortex of the medial LEFT frontoparietal lobe. Periventricular T2 and multiple foci of T2 hyperintensity in the subcortical white matter and increased FLAIR signal in the cortex of the LEFT frontal lobe.   Syncope and collapse 07/25/2020    Past Surgical History:  Procedure Laterality Date   A/V FISTULAGRAM Left 03/30/2019   Procedure: A/V FISTULAGRAM;  Surgeon: Marea Selinda RAMAN, MD;  Location: [facility];  Service: Cardiovascular;  Laterality: Left;   A/V FISTULAGRAM Left 10/06/2019   Procedure: A/V FISTULAGRAM;  Surgeon: Marea Selinda RAMAN, MD;  Location: [facility];  Service: Cardiovascular;  Laterality: Left;   A/V FISTULAGRAM Left 05/19/2021   Procedure: A/V FISTULAGRAM;  Surgeon: Marea Selinda RAMAN, MD;  Location: [facility];  Service: Cardiovascular;  Laterality: Left;   A/V FISTULAGRAM Left 01/29/2022   Procedure: A/V Fistulagram;  Surgeon: Marea Selinda RAMAN, MD;  Location: [facility];  Service: Cardiovascular;  Laterality: Left;   A/V FISTULAGRAM Left 04/30/2022   Procedure: A/V Fistulagram;  Surgeon: Marea Selinda RAMAN, MD;  Location: [facility];  Service: Cardiovascular;  Laterality: Left;   A/V  FISTULAGRAM Left 08/20/2022   Procedure: A/V Fistulagram;  Surgeon: Marea Selinda RAMAN, MD;  Location: [facility];  Service: Cardiovascular;  Laterality: Left;   A/V FISTULAGRAM N/A 12/03/2022   Procedure: A/V Fistulagram;  Surgeon: Marea Selinda RAMAN, MD;  Location: [facility];  Service: Cardiovascular;  Laterality: N/A;   A/V FISTULAGRAM Left 02/11/2023   Procedure: A/V Fistulagram;  Surgeon: Marea Selinda RAMAN, MD;  Location: [facility];  Service: Cardiovascular;  Laterality: Left;   A/V FISTULAGRAM Left 02/07/2024   Procedure: A/V Fistulagram;  Surgeon: Marea Selinda RAMAN, MD;  Location: [facility];  Service: Cardiovascular;  Laterality: Left;   COLONOSCOPY     COLONOSCOPY WITH PROPOFOL  N/A 04/22/2021   Procedure: COLONOSCOPY WITH PROPOFOL ;  Surgeon: Maryruth Ole DASEN, MD;  Location: [facility];  Service: Endoscopy;  Laterality: N/A;   CORONARY ANGIOPLASTY WITH STENT PLACEMENT Left 01/25/2002   Procedure: CORONARY ANGIOPLASTY WITH STENT PLACEMENT; Location: [facility]; Surgeon: Margie Lovelace, MD   CORONARY ARTERY BYPASS GRAFT  N/A 02/20/2014   Procedure: CORONARY ARTERY BYPASS GRAFT; Location: [facility]; Surgeon: Reyes Fruits, MD   DIALYSIS FISTULA CREATION     DIALYSIS/PERMA CATHETER INSERTION N/A 04/08/2023   Procedure: DIALYSIS/PERMA CATHETER INSERTION;  Surgeon: Marea Selinda RAMAN, MD;  Location: [facility];  Service: Cardiovascular;  Laterality: N/A;   DIALYSIS/PERMA CATHETER REMOVAL N/A 07/06/2023   Procedure: DIALYSIS/PERMA CATHETER REMOVAL;  Surgeon: [name] Cordella MATSU, MD;  Location: [facility];  Service: Cardiovascular;  Laterality: N/A;   ESOPHAGOGASTRODUODENOSCOPY     ESOPHAGOGASTRODUODENOSCOPY N/A 04/22/2021   Procedure: ESOPHAGOGASTRODUODENOSCOPY (EGD);  Surgeon: Maryruth Ole DASEN, MD;  Location: [facility];  Service: Endoscopy;  Laterality: N/A;   FLEXIBLE SIGMOIDOSCOPY N/A 02/23/2019   Procedure: FLEXIBLE SIGMOIDOSCOPY;  Surgeon: Gaylyn Gladis PENNER, MD;  Location: [facility];  Service: Endoscopy;  Laterality: N/A;   IABP INSERTION  02/20/2014   Procedure: INTRA-AORTIC BALLOON PUMP INSERTION; Location: [facility]; Surgeon: Reyes Fruits, MD   INSERTION OF ARTERIOVENOUS (AV) ARTEGRAFT ARM Right 03/09/2024   Procedure: INSERTION, GRAFT, ARTERIOVENOUS, UPPER EXTREMITY;  Surgeon: Marea Selinda RAMAN, MD;  Location: [facility];  Service: Vascular;  Laterality: Right;  BRACHIAL AXILLARY   INSERTION OF DIALYSIS CATHETER N/A 04/20/2024   Procedure: INSERTION OF DIALYSIS CATHETER;  Surgeon: Marea Selinda RAMAN, MD;  Location: [facility];  Service: Vascular;  Laterality: N/A;   LEFT HEART CATH AND CORONARY ANGIOGRAPHY Left 01/24/2002   Procedure: LEFT HEART CATH AND CORONARY ANGIOGRAPHY; Location: [facility]; Surgeon: Margie Lovelace, MD   LEFT HEART CATH AND CORONARY ANGIOGRAPHY Left 05/25/2012   Procedure: LEFT HEART CATH AND CORONARY ANGIOGRAPHY; Location: [facility]; Surgeon: Margie Lovelace, MD   LEFT HEART CATH AND CORONARY ANGIOGRAPHY Left 10/09/2013   Procedure: LEFT HEART CATH AND CORONARY ANGIOGRAPHY; Location: [facility]; Surgeon: Vinie Jude, MD   LEFT HEART CATH AND CORS/GRAFTS ANGIOGRAPHY N/A 08/08/2018   Procedure: LEFT HEART CATH AND CORS/GRAFTS ANGIOGRAPHY;  Surgeon: Fernand Denyse LABOR, MD;  Location: [facility];  Service: Cardiovascular;  Laterality: N/A;   LEFT HEART CATH AND CORS/GRAFTS ANGIOGRAPHY N/A 01/30/2019   Procedure: LEFT HEART CATH AND CORS/GRAFTS ANGIOGRAPHY;  Surgeon: Fernand Denyse LABOR, MD;  Location: [facility];  Service: Cardiovascular;  Laterality: N/A;   LIGATION ARTERIOVENOUS GORTEX GRAFT Right 04/20/2024   Procedure: LIGATION ARTERIOVENOUS GORTEX GRAFT;  Surgeon: Marea Selinda RAMAN, MD;  Location: [facility];  Service: Vascular;  Laterality: Right;   LOWER EXTREMITY ANGIOGRAPHY Left 04/24/2024   Procedure: Lower Extremity Angiography;  Surgeon: Marea Selinda RAMAN, MD;  Location: [facility];  Service: Cardiovascular;  Laterality: Left;    ultrasound guided pericardiocentesis     UPPER EXTREMITY ANGIOGRAPHY Right 03/30/2024   Procedure: Upper Extremity Angiography;  Surgeon: Marea Selinda RAMAN, MD;  Location: [facility];  Service: Cardiovascular;  Laterality: Right;     reports that she has been smoking cigarettes. She has a 5 pack-year smoking history. She has never used smokeless tobacco. She reports that she does not drink alcohol and does not use drugs.  Allergies[1]  Family History  Problem Relation Age of Onset   Hypertension Mother    Ovarian cancer Mother    Diabetes type II Mother    Hypertension Father    Kidney disease Father    Hypertension Sister    Diabetes type II Maternal Grandmother    Breast cancer Maternal Grandmother    Breast cancer Maternal Aunt    Hypertension Other    Cancer Other    Renal Disease Other  Prior to Admission medications  Medication Sig Start Date End Date Taking? Authorizing Provider  albuterol  (VENTOLIN  HFA) 108 (90 Base) MCG/ACT inhaler Inhale 1-2 puffs into the lungs every 6 (six) hours as needed for wheezing or shortness of breath. 01/04/24   Orlean Alan HERO, FNP  apixaban  (ELIQUIS ) 5 MG TABS tablet Take 5 mg by mouth 2 (two) times daily. 09/11/19   [provider]  clobetasol cream (TEMOVATE) 0.05 % Apply 1 Application topically daily. 06/16/24   [provider]  colchicine  0.6 MG tablet Take 1 tablet (0.6 mg total) by mouth once for 1 dose. Patient not taking: Reported on 05/23/2024 04/13/24 05/19/24  Scoggins, [facility], NP  hydrOXYzine  (ATARAX /VISTARIL ) 50 MG tablet Take 50 mg by mouth 2 (two) times daily.    [provider]  levothyroxine  (SYNTHROID ) 75 MCG tablet Take 75 mcg by mouth daily before breakfast.  11/02/18   [provider]  lidocaine -prilocaine  (EMLA ) cream Apply 1 Application topically once. 01/22/21   [provider]  [Paused] metoprolol  succinate (TOPROL -XL) 25 MG 24 hr tablet Take 1 tablet by mouth every  morning. Wait to take this until your doctor or other care provider tells you to start again. 08/27/23 08/26/24  [provider]  midodrine  (PROAMATINE ) 10 MG tablet Take 1 tablet (10 mg total) by mouth 3 (three) times daily. 06/27/24   Jerelene Critchley, MD  oxyCODONE -acetaminophen  (PERCOCET) 5-325 MG tablet Take 1 tablet by mouth every 6 (six) hours as needed for severe pain (pain score 7-10). 06/27/24   Jerelene Critchley, MD  pantoprazole  (PROTONIX ) 40 MG tablet Take 1 tablet (40 mg total) by mouth every morning. 06/27/24   Jerelene Critchley, MD  pregabalin (LYRICA) 50 MG capsule Take 50 mg by mouth daily. Patient not taking: Reported on 06/23/2024    [provider]  ranolazine  (RANEXA ) 1000 MG SR tablet Take 1,000 mg by mouth 2 (two) times daily. Patient not taking: Reported on 06/23/2024 03/18/23   [provider]  rosuvastatin  (CRESTOR ) 20 MG tablet Take 20 mg by mouth at bedtime. Patient not taking: Reported on 06/23/2024 11/07/21   [provider]  sevelamer  carbonate (RENVELA ) 800 MG tablet Take 3,200 mg by mouth 3 (three) times daily with meals. Patient not taking: Reported on 06/23/2024    [provider]  triamcinolone  cream (KENALOG ) 0.1 % Apply 1 Application topically 2 (two) times daily. Patient not taking: Reported on 06/23/2024 11/02/23   Orlean Alan HERO, FNP  venlafaxine  (EFFEXOR ) 37.5 MG tablet Take 37.5 mg by mouth 2 (two) times daily with a meal. Patient not taking: Reported on 06/23/2024    [provider]    Physical Exam: Vitals:   07/01/24 1530 07/01/24 1545 07/01/24 1600 07/01/24 1630  BP: (!) 127/56  (!) 119/55 (!) 103/90  Pulse:  90 88   Resp: (!) 30 (!) 23 (!) 23 19  Temp:      TempSrc:      SpO2:  91% 99%   Weight:      Height:        Physical Exam   Constitutional: Alert, awake, calm, comfortable HEENT: Neck supple Respiratory: Clear to auscultation B/L, no wheezing, no rales.  Cardiovascular: Regular rate and  rhythm, no murmurs / rubs / gallops. No extremity edema. 2+ pedal pulses. No carotid bruits.  Abdomen: Soft, no tenderness, Bowel sounds positive.  Musculoskeletal: no clubbing / cyanosis. Good ROM, no contractures. Normal muscle tone.  Skin: no rashes, lesions, ulcers. Neurologic: CN 2-12 grossly intact.  Sensation intact, No focal deficit identified Psychiatric: Alert and oriented x 3. Normal mood.    Labs on Admission: I have personally reviewed following labs and imaging studies  CBC: Recent Labs  Lab 06/25/24 0354 06/26/24 0358 06/27/24 0523 06/28/24 0226 07/01/24 1336  WBC 4.6 5.0 5.7 6.2 12.0*  NEUTROABS  --   --   --   --  10.4*  HGB 11.7* 11.6* 11.5* 11.3* 12.7  HCT 36.7 37.4 35.5* 35.1* 38.7  MCV 100.0 100.3* 99.4 98.9 97.0  PLT 106* 110* 103* 101* 82*   Basic Metabolic Panel: Recent Labs  Lab 06/25/24 0354 06/26/24 0358 06/27/24 0523 06/27/24 1431 06/28/24 0226 07/01/24 1336  NA 138 141 142 140 141 138  K 4.1 4.0 4.0 4.2 4.2 4.0  CL 97* 98 95* 94* 96* 92*  CO2 25 24 24 24 22  21*  GLUCOSE 88 89 91 100* 102* 70  BUN 31* 40* 22 26* 32* 28*  CREATININE 5.94* 7.21* 4.85* 5.25* 5.87* 5.07*  CALCIUM  9.8 10.1 10.2 10.6* 10.5* 10.4*  MG 2.2 2.1  --   --   --   --   PHOS 5.7* 6.5*  --   --   --   --    GFR: Estimated Creatinine Clearance: 10.9 mL/min (A) (by C-G formula based on SCr of 5.07 mg/dL (H)). Liver Function Tests: Recent Labs  Lab 06/25/24 0354 06/26/24 0358 07/01/24 1336  AST  --   --  36  ALT  --   --  15  ALKPHOS  --   --  71  BILITOT  --   --  1.2  PROT  --   --  7.2  ALBUMIN  3.9 4.0 3.8   No results for input(s): LIPASE, AMYLASE in the last 168 hours. No results for input(s): AMMONIA in the last 168 hours. Coagulation Profile: No results for input(s): INR, PROTIME in the last 168 hours. Cardiac Enzymes: No results for input(s): CKTOTAL, CKMB, CKMBINDEX, TROPONINI, TROPONINIHS in the last 168 hours. BNP (last 3  results) No results for input(s): BNP in the last 8760 hours. HbA1C: No results for input(s): HGBA1C in the last 72 hours. CBG: Recent Labs  Lab 06/27/24 0427 06/27/24 0839 06/27/24 1200 07/01/24 1602 07/01/24 1625  GLUCAP 114* 126* 92 59* 127*   Lipid Profile: No results for input(s): CHOL, HDL, LDLCALC, TRIG, CHOLHDL, LDLDIRECT in the last 72 hours. Thyroid  Function Tests: No results for input(s): TSH, T4TOTAL, FREET4, T3FREE, THYROIDAB in the last 72 hours. Anemia Panel: No results for input(s): VITAMINB12, FOLATE, FERRITIN, TIBC, IRON, RETICCTPCT in the last 72 hours. Urine analysis:    Component Value Date/Time   COLORURINE Yellow 09/14/2012 0414   APPEARANCEUR Cloudy 09/14/2012 0414   LABSPEC 1.018 09/14/2012 0414   PHURINE 9.0 09/14/2012 0414   GLUCOSEU 50 mg/dL 95/97/7985 9585   HGBUR 1+ 09/14/2012 0414   BILIRUBINUR Negative 09/14/2012 0414   KETONESUR Negative 09/14/2012 0414   PROTEINUR >=500 09/14/2012 0414   NITRITE Negative 09/14/2012 0414   LEUKOCYTESUR 2+ 09/14/2012 0414    Radiological Exams on Admission: I have personally reviewed images CT Cervical Spine Wo Contrast Result Date: 07/01/2024 EXAM: CT CERVICAL SPINE WITHOUT CONTRAST 07/01/2024 03:13:30 PM TECHNIQUE: CT of the cervical spine was performed without the administration of intravenous contrast. Multiplanar reformatted images are provided for review. Automated exposure control, iterative reconstruction, and/or weight based adjustment of the mA/kV was utilized to reduce the radiation dose to as low as reasonably achievable. COMPARISON: 06/22/2024 and  02/16/2022 CLINICAL HISTORY: Ataxia, cervical trauma FINDINGS: BONES AND ALIGNMENT: No acute fracture or traumatic malalignment. Focal kyphosis at C5-C6 is stable. Straightening of the normal cervical lordosis otherwise is stable. DEGENERATIVE CHANGES: Chronic endplate changes at C5-C6. SOFT TISSUES: Adjacent soft  tissues are unremarkable. Calcifications are again seen within the thyroid  bilaterally. Atherosclerotic calcifications are present in the proximal great vessels and at the carotid bifurcations without definite stenosis. A right IJ catheter is in place. No prevertebral soft tissue swelling. IMPRESSION: 1. No acute cervical spine findings. 2. Stable chronic endplate changes and focal kyphosis at C5-6. Electronically signed by: Lonni Necessary MD 07/01/2024 03:57 PM EST RP Workstation: [id number]   CT HEAD WO CONTRAST ( ) Result Date: 07/01/2024 EXAM: CT HEAD WITHOUT CONTRAST 07/01/2024 03:13:30 PM TECHNIQUE: CT of the head was performed without the administration of intravenous contrast. Automated exposure control, iterative reconstruction, and/or weight based adjustment of the mA/kV was utilized to reduce the radiation dose to as low as reasonably achievable. COMPARISON: 06/22/2024 CLINICAL HISTORY: Head trauma, abnormal mental status (Age 60-64y). Fall today. FINDINGS: BRAIN AND VENTRICLES: No acute hemorrhage. No evidence of acute infarct. Chronic encephalomalacia within bilateral frontal, parietal, and right occipital lobes compatible with remote infarcts. Extensive periventricular and subcortical white matter low-density changes compatible with chronic microvascular ischemic change. No hydrocephalus. No extra-axial collection. No mass effect or midline shift. ORBITS: No acute abnormality. SINUSES: No acute abnormality. SOFT TISSUES AND SKULL: No acute soft tissue abnormality. No skull fracture. Atherosclerotic calcifications in large vessels of skull base. IMPRESSION: 1. No acute intracranial abnormality. 2. Chronic encephalomalacia within bilateral frontal, parietal, and right occipital lobes compatible with remote infarcts. 3. Extensive periventricular and subcortical white matter low-density changes compatible with chronic microvascular ischemic change. Electronically signed by: Lonni Necessary MD  07/01/2024 03:54 PM EST RP Workstation: [id number]   DG Wrist Complete Right Result Date: 07/01/2024 EXAM: 3 or more VIEW(S) XRAY OF THE RIGHT WRIST 07/01/2024 12:22:33 PM COMPARISON: None available. CLINICAL HISTORY: fall FINDINGS: BONES AND JOINTS: No acute fracture. No malalignment. Subchondral cysts in the base of the first metacarpal bone with degenerative changes of the first [facility] joint. SOFT TISSUES: Unremarkable. IMPRESSION: 1. No acute fracture or dislocation. Electronically signed by: Waddell Calk MD 07/01/2024 12:51 PM EST RP Workstation: [id number]   DG Pelvis Portable Result Date: 07/01/2024 EXAM: 1 or 2 VIEW(S) XRAY OF THE PELVIS 07/01/2024 12:22:33 PM COMPARISON: 01/13/2016 CLINICAL HISTORY: fall FINDINGS: BONES AND JOINTS: No acute fracture. No malalignment. SOFT TISSUES: Likely calcified uterine fibroids. Severe atherosclerotic plaque. IMPRESSION: 1. No evidence of acute traumatic injury. Electronically signed by: Waddell Calk MD 07/01/2024 12:49 PM EST RP Workstation: [id number]   DG Chest Portable 1 View Result Date: 07/01/2024 EXAM: 1 VIEW(S) XRAY OF THE CHEST 07/01/2024 11:45:00 AM COMPARISON: 06/25/2024 CLINICAL HISTORY: SOB FINDINGS: LINES, TUBES AND DEVICES: Right chest wall dialysis catheter in place with tip overlying right atrium. Left brachial stent noted. LUNGS AND PLEURA: Pulmonary interstitial prominence with mild pulmonary edema. Decreased left pleural effusion. No pneumothorax. HEART AND MEDIASTINUM: Cardiomegaly. Coronary artery calcifications and stents. Surgical changes in mediastinum. Aortic calcification. BONES AND SOFT TISSUES: Intact sternotomy wires. IMPRESSION: 1. Pulmonary interstitial prominence with mild pulmonary edema, similar . 2. Decreased left pleural effusion. 3. Cardiomegaly. Electronically signed by: Waddell Calk MD 07/01/2024 11:55 AM EST RP Workstation: [id number]    EKG: My personal interpretation of EKG shows: Sinus rhythm, no ST  elevation    Assessment/Plan Active Problems:   ESRD on hemodialysis (HCC)   Chronic combined  systolic and diastolic CHF (congestive heart failure) (HCC)   Paroxysmal atrial fibrillation (HCC)   Chronic hypotension   Anemia of chronic disease   Hypothyroidism   S/P CABG x 3   Hepatitis C antibody positive in blood   Other cirrhosis of liver (HCC)   Bipolar disorder in full remission, most recent episode unspecified type   Chronic pain syndrome   Syncope and collapse    Assessment and Plan: 64 year old female with multiple medical problems including but not limited to recurrent syncope, ESRD on hemodialysis, HFrEF, A-fib on Eliquis , chronic hypotension on midodrine , hypothyroidism, anemia, CAD s/p CABG, history of liver cirrhosis, bipolar, chronic pain syndrome who was brought in for fall/syncope found down on the house floor this morning around 9:30 AM.  1.  Fall/syncope/collapse - Patient had similar incident in the past. -There was no definite etiology of not completing dialysis yesterday--suspect this is due to hypotension. - Blood pressure appears to be fairly okay today. - Continue to monitor her blood pressure and heart rate in telemetry. - Orthostatic blood pressure - PT/OT - She lives alone and may need to place her in the nursing home.  2.  ESRD on hemodialysis - Patient did not complete dialysis on Friday - Nephrology has been consulted by ED provider - She will be given dialysis today or tomorrow.  3.  Lethargy//sleepiness - There could be an etiology for that. - Will get ammonia level checked and VBG to see if she has a hypercarbia. - She continues oxygen  2 L via nasal cannula and oxygen  saturation is good - Depending on the result of ammonia and CO2, will need further intervention.  4.  HFrEF/atrial fibrillation on Eliquis /elevated troponin - Resume Eliquis  - Her blood pressure medications were held due to low blood pressure before. - Continue to monitor  blood pressure and resume medication as needed - Patient denies any chest pain and her troponin have been high from before. - This may be related to kidney failure or heart failure.  5.  Chronic hypotension on midodrine  - Continue midodrine  - Continue to monitor blood pressure  6.  Anemia of chronic disease - Continue to monitor hemoglobin hematocrit  7.  CAD s/p CABG - Patient was prescribed ranolazine  in the past.  But patient is not on at this point.   8.  Hypothyroidism - Resume levothyroxine  at 75 mcg daily.  9.  GERD Continue Protonix  40 mg  10.  Depression Continue venlafaxine  at home dose  11.  Leukocytosis/right hand pain/left heel chronic ulcer - There is no signs of acute infection. - Leukocytosis could be reactive due to lying down on the floor - Will continue to monitor leukocytosis and signs of infection - No antibiotic at this point.     DVT prophylaxis: Eliquis  Code Status: Full Code Family Communication: Brother Fairy was at bedside.  Patient is alert awake and oriented.  She wanted to be full code.  Fairy confirmed that she does not have a power of attorney.  Patient stated that she does not want to make anybody power of attorney.  She wants to make her decision.  She wants to be full code. Disposition Plan: Long-term nursing home Consults called: Nephrology by EDP Admission status: Observation, Telemetry bed   Nena Rebel, MD [facility] 07/01/2024, 4:55 PM        [1]  Allergies Allergen Reactions   Morphine  And Codeine Hives   Levaquin [Levofloxacin] Itching and Other (See Comments)    Severe itching; prickly  sensation; severe left leg pain; immobility.   Enalapril  Other (See Comments)    hallucinations   Latex Rash   Tape Rash and Other (See Comments)   "

## 2024-07-01 NOTE — ED Notes (Signed)
 More awake after dextrose . Will recheck and monitor. Family at [facility].

## 2024-07-01 NOTE — ED Notes (Signed)
 Repeat gray and light green sent for lab per lab

## 2024-07-01 NOTE — ED Notes (Signed)
 EDP at [facility] with doppler for BLE assessment

## 2024-07-01 NOTE — ED Notes (Addendum)
Back from CT. EDP at [facility].  

## 2024-07-01 NOTE — ED Notes (Signed)
 EDP back at [facility]. Family at [facility]. Pt more awake and interactive.

## 2024-07-01 NOTE — ED Notes (Signed)
 Pt alert, NAD, calm, interactive, resps e/u, speaking in clear complete sentences, VSS. Family at [facility]. Pt c/o pain all over, specifically R hand and BLE/feet. Exquisitely TTP everywhere. BP cuff to R FA at this time. IV team in to see, to obtain IV access and blood.

## 2024-07-02 ENCOUNTER — Observation Stay

## 2024-07-02 ENCOUNTER — Inpatient Hospital Stay

## 2024-07-02 LAB — GLUCOSE, CAPILLARY
Glucose-Capillary: 79 mg/dL (ref 70–99)
Glucose-Capillary: 82 mg/dL (ref 70–99)

## 2024-07-02 LAB — BLOOD GAS, ARTERIAL
Acid-Base Excess: 0.8 mmol/L (ref 0.0–2.0)
Acid-Base Excess: 2.9 mmol/L — ABNORMAL HIGH (ref 0.0–2.0)
Bicarbonate: 24.2 mmol/L (ref 20.0–28.0)
Bicarbonate: 25.7 mmol/L (ref 20.0–28.0)
FIO2: 1 %
FIO2: 100 %
MECHVT: 450 mL
MECHVT: 450 mL
Mechanical Rate: 18
O2 Saturation: 97.6 %
O2 Saturation: 99.2 %
PEEP: 5 cmH2O
PEEP: 5 cmH2O
Patient temperature: 37
Patient temperature: 37
RATE: 18 {breaths}/min
pCO2 arterial: 34 mmHg (ref 32–48)
pCO2 arterial: 33 mmHg (ref 32–48)
pH, Arterial: 7.46 — ABNORMAL HIGH (ref 7.35–7.45)
pH, Arterial: 7.5 — ABNORMAL HIGH (ref 7.35–7.45)
pO2, Arterial: 85 mmHg (ref 83–108)
pO2, Arterial: 202 mmHg — ABNORMAL HIGH (ref 83–108)

## 2024-07-02 LAB — COMPREHENSIVE METABOLIC PANEL WITH GFR
ALT: 14 U/L (ref 0–44)
ALT: 19 U/L (ref 0–44)
AST: 37 U/L (ref 15–41)
AST: 54 U/L — ABNORMAL HIGH (ref 15–41)
Albumin: 3.5 g/dL (ref 3.5–5.0)
Albumin: 3.5 g/dL (ref 3.5–5.0)
Alkaline Phosphatase: 72 U/L (ref 38–126)
Alkaline Phosphatase: 81 U/L (ref 38–126)
Anion gap: 21 — ABNORMAL HIGH (ref 5–15)
Anion gap: 23 — ABNORMAL HIGH (ref 5–15)
BUN: 35 mg/dL — ABNORMAL HIGH (ref 8–23)
BUN: 18 mg/dL (ref 8–23)
CO2: 23 mmol/L (ref 22–32)
CO2: 23 mmol/L (ref 22–32)
Calcium: 10.5 mg/dL — ABNORMAL HIGH (ref 8.9–10.3)
Calcium: 10.3 mg/dL (ref 8.9–10.3)
Chloride: 92 mmol/L — ABNORMAL LOW (ref 98–111)
Chloride: 90 mmol/L — ABNORMAL LOW (ref 98–111)
Creatinine, Ser: 5.79 mg/dL — ABNORMAL HIGH (ref 0.44–1.00)
Creatinine, Ser: 3.57 mg/dL — ABNORMAL HIGH (ref 0.44–1.00)
GFR, Estimated: 8 mL/min — ABNORMAL LOW
GFR, Estimated: 14 mL/min — ABNORMAL LOW
Glucose, Bld: 106 mg/dL — ABNORMAL HIGH (ref 70–99)
Glucose, Bld: 154 mg/dL — ABNORMAL HIGH (ref 70–99)
Potassium: 4.3 mmol/L (ref 3.5–5.1)
Potassium: 3.9 mmol/L (ref 3.5–5.1)
Sodium: 135 mmol/L (ref 135–145)
Sodium: 136 mmol/L (ref 135–145)
Total Bilirubin: 1.3 mg/dL — ABNORMAL HIGH (ref 0.0–1.2)
Total Bilirubin: 1.4 mg/dL — ABNORMAL HIGH (ref 0.0–1.2)
Total Protein: 6.9 g/dL (ref 6.5–8.1)
Total Protein: 6.6 g/dL (ref 6.5–8.1)

## 2024-07-02 LAB — CBC
HCT: 37 % (ref 36.0–46.0)
HCT: 37.3 % (ref 36.0–46.0)
Hemoglobin: 12.3 g/dL (ref 12.0–15.0)
Hemoglobin: 12.2 g/dL (ref 12.0–15.0)
MCH: 31.9 pg (ref 26.0–34.0)
MCH: 31.5 pg (ref 26.0–34.0)
MCHC: 33.2 g/dL (ref 30.0–36.0)
MCHC: 32.7 g/dL (ref 30.0–36.0)
MCV: 95.9 fL (ref 80.0–100.0)
MCV: 96.4 fL (ref 80.0–100.0)
Platelets: 68 K/uL — ABNORMAL LOW (ref 150–400)
Platelets: 70 K/uL — ABNORMAL LOW (ref 150–400)
RBC: 3.86 MIL/uL — ABNORMAL LOW (ref 3.87–5.11)
RBC: 3.87 MIL/uL (ref 3.87–5.11)
RDW: 17.7 % — ABNORMAL HIGH (ref 11.5–15.5)
RDW: 18 % — ABNORMAL HIGH (ref 11.5–15.5)
WBC: 12.2 K/uL — ABNORMAL HIGH (ref 4.0–10.5)
WBC: 14.2 K/uL — ABNORMAL HIGH (ref 4.0–10.5)
nRBC: 0 % (ref 0.0–0.2)
nRBC: 0 % (ref 0.0–0.2)

## 2024-07-02 LAB — TROPONIN T, HIGH SENSITIVITY: Troponin T High Sensitivity: 167 ng/L (ref 0–19)

## 2024-07-02 LAB — LACTIC ACID, PLASMA
Lactic Acid, Venous: 5.4 mmol/L (ref 0.5–1.9)
Lactic Acid, Venous: 3.7 mmol/L (ref 0.5–1.9)

## 2024-07-02 LAB — HEPATITIS B SURFACE ANTIGEN: Hepatitis B Surface Ag: NONREACTIVE

## 2024-07-02 LAB — PRO BRAIN NATRIURETIC PEPTIDE: Pro Brain Natriuretic Peptide: >35000 pg/mL — ABNORMAL HIGH

## 2024-07-02 MED ORDER — VASOPRESSIN 20 UNITS/100 ML INFUSION FOR SHOCK
0.0000 [IU]/min | INTRAVENOUS | Status: DC
  Administered 2024-07-02 – 2024-07-07 (×4): 0.03 [IU]/min via INTRAVENOUS
  Filled 2024-07-02 (×4): qty 100

## 2024-07-02 MED ORDER — AMIODARONE HCL IN DEXTROSE 360-4.14 MG/200ML-% IV SOLN
INTRAVENOUS | Status: AC
  Filled 2024-07-02: qty 200

## 2024-07-02 MED ORDER — PROPOFOL 1000 MG/100ML IV EMUL
0.0000 ug/kg/min | INTRAVENOUS | Status: DC
  Administered 2024-07-02: 20 ug/kg/min via INTRAVENOUS
  Administered 2024-07-02 – 2024-07-03 (×2): 30 ug/kg/min via INTRAVENOUS
  Administered 2024-07-03: 10 ug/kg/min via INTRAVENOUS
  Administered 2024-07-03 – 2024-07-06 (×9): 30 ug/kg/min via INTRAVENOUS
  Filled 2024-07-02 (×12): qty 100

## 2024-07-02 MED ORDER — HEPARIN (PORCINE) 25000 UT/250ML-% IV SOLN
1450.0000 [IU]/h | INTRAVENOUS | Status: DC
  Administered 2024-07-02: 1000 [IU]/h via INTRAVENOUS
  Administered 2024-07-03: 950 [IU]/h via INTRAVENOUS
  Administered 2024-07-04 – 2024-07-05 (×2): 1300 [IU]/h via INTRAVENOUS
  Administered 2024-07-06 – 2024-07-14 (×10): 1450 [IU]/h via INTRAVENOUS
  Filled 2024-07-02 (×15): qty 250

## 2024-07-02 MED ORDER — METOPROLOL TARTRATE 5 MG/5ML IV SOLN
5.0000 mg | INTRAVENOUS | Status: DC | PRN
  Administered 2024-07-02: 5 mg via INTRAVENOUS
  Filled 2024-07-02: qty 5

## 2024-07-02 MED ORDER — HEPARIN SODIUM (PORCINE) 1000 UNIT/ML IJ SOLN
INTRAMUSCULAR | Status: AC
  Filled 2024-07-02: qty 6

## 2024-07-02 MED ORDER — DIGOXIN 0.25 MG/ML IJ SOLN
INTRAMUSCULAR | Status: AC
  Filled 2024-07-02: qty 2

## 2024-07-02 MED ORDER — DIGOXIN 0.25 MG/ML IJ SOLN
0.5000 mg | Freq: Once | INTRAMUSCULAR | Status: AC
  Administered 2024-07-02: 0.5 mg via INTRAVENOUS

## 2024-07-02 MED ORDER — NOREPINEPHRINE 4 MG/250ML-% IV SOLN
INTRAVENOUS | Status: AC
  Filled 2024-07-02: qty 250

## 2024-07-02 MED ORDER — COLLAGENASE 250 UNIT/GM EX OINT
1.0000 | TOPICAL_OINTMENT | Freq: Every day | CUTANEOUS | Status: AC
  Administered 2024-07-02 – 2024-07-21 (×20): 1 via TOPICAL
  Filled 2024-07-02 (×4): qty 30

## 2024-07-02 MED ORDER — ORAL CARE MOUTH RINSE: 15.0000 mL | OROMUCOSAL | Status: DC | PRN

## 2024-07-02 MED ORDER — AMIODARONE HCL IN DEXTROSE 360-4.14 MG/200ML-% IV SOLN: 30.0000 mg/h | INTRAVENOUS | Status: DC

## 2024-07-02 MED ORDER — AMIODARONE HCL IN DEXTROSE 360-4.14 MG/200ML-% IV SOLN
30.0000 mg/h | INTRAVENOUS | Status: AC
  Administered 2024-07-02 – 2024-07-03 (×2): 30 mg/h via INTRAVENOUS
  Filled 2024-07-02 (×3): qty 200

## 2024-07-02 MED ORDER — SODIUM CHLORIDE 0.9 % IV SOLN
100.0000 mg | Freq: Two times a day (BID) | INTRAVENOUS | Status: DC
  Administered 2024-07-02 – 2024-07-03 (×3): 100 mg via INTRAVENOUS
  Filled 2024-07-02 (×3): qty 100

## 2024-07-02 MED ORDER — AMIODARONE HCL IN DEXTROSE 360-4.14 MG/200ML-% IV SOLN
60.0000 mg/h | INTRAVENOUS | Status: AC
  Administered 2024-07-02 (×2): 60 mg/h via INTRAVENOUS

## 2024-07-02 MED ORDER — SODIUM CHLORIDE 0.9% FLUSH: 10.0000 mL | INTRAVENOUS | Status: AC | PRN

## 2024-07-02 MED ORDER — SODIUM CHLORIDE 0.9% FLUSH
10.0000 mL | Freq: Two times a day (BID) | INTRAVENOUS | Status: AC
  Administered 2024-07-02: 30 mL
  Administered 2024-07-03 (×2): 10 mL
  Administered 2024-07-04: 30 mL
  Administered 2024-07-04: 10 mL
  Administered 2024-07-05: 20 mL
  Administered 2024-07-05: 10 mL
  Administered 2024-07-06: 20 mL
  Administered 2024-07-06: 30 mL
  Administered 2024-07-07: 10 mL
  Administered 2024-07-07: 20 mL
  Administered 2024-07-08 – 2024-07-09 (×4): 10 mL
  Administered 2024-07-10: 20 mL
  Administered 2024-07-10 – 2024-07-11 (×2): 10 mL
  Administered 2024-07-11: 30 mL
  Administered 2024-07-12 – 2024-07-15 (×8): 10 mL
  Administered 2024-07-16: 20 mL
  Administered 2024-07-16 – 2024-07-21 (×11): 10 mL

## 2024-07-02 MED ORDER — ORAL CARE MOUTH RINSE
15.0000 mL | OROMUCOSAL | Status: DC
  Administered 2024-07-02 – 2024-07-10 (×92): 15 mL via OROMUCOSAL

## 2024-07-02 MED ORDER — FENTANYL CITRATE (PF) 50 MCG/ML IJ SOSY
PREFILLED_SYRINGE | INTRAMUSCULAR | Status: AC
  Administered 2024-07-02: 12.5 ug via INTRAVENOUS
  Filled 2024-07-02: qty 2

## 2024-07-02 MED ORDER — NOREPINEPHRINE 4 MG/250ML-% IV SOLN
0.0000 ug/min | INTRAVENOUS | Status: DC
  Administered 2024-07-02: 4 ug/min via INTRAVENOUS
  Administered 2024-07-02: 2 ug/min via INTRAVENOUS
  Administered 2024-07-03: 1 ug/min via INTRAVENOUS
  Administered 2024-07-03: 2 ug/min via INTRAVENOUS
  Administered 2024-07-04: 1 ug/min via INTRAVENOUS
  Filled 2024-07-02 (×2): qty 250

## 2024-07-02 MED ORDER — LACTATED RINGERS IV BOLUS
500.0000 mL | Freq: Once | INTRAVENOUS | Status: AC
  Administered 2024-07-02: 500 mL via INTRAVENOUS

## 2024-07-02 MED ORDER — HYDROXYZINE HCL 25 MG PO TABS
50.0000 mg | ORAL_TABLET | Freq: Two times a day (BID) | ORAL | Status: DC
  Administered 2024-07-02: 50 mg via ORAL
  Filled 2024-07-02: qty 1

## 2024-07-02 NOTE — Consult Note (Signed)
 WOC Nurse Consult Note: patient is known to WOC team from previous admissions with L achilles/heel wound r/t PAD; patient evaluated by vascular surgeon at last visit with no intervention; patient did have an angiogram within last 2 months per his note  Reason for Consult: L foot wounds  Wound type: Full thickness L dorsal foot and L achilles/heel r/t PAD  Pressure Injury POA: NA not pressure  Measurement: see nursing flowsheet  Wound bed: L dorsal foot 100% tan tissue; L posterior heel/achilles dark eschar like tissue  Drainage (amount, consistency, odor) see nursing flowsheet  Periwound:flaky scaly skin  Dressing procedure/placement/frequency: Paint L posterior heel/achilles wound with Betadine 2 times daily and allow to air dry. Cover with dry gauze and tape or silicone foam whichever is preferred.  Cleanse L dorsal foot wound with NS, Apply 1/4 thick layer of Santyl  to wound bed daily, top with saline moist gauze and dry gauze. Secure with silicone foam or Kerlix roll gauze and tape whichever is preferred.   Patient should continue to follow with vascular surgeon as outpatient for ongoing management.  POC discussed with bedside nurse. WOC team will not follow. Reconsult if further needs arise.   Thank you,    Powell Bar MSN, RN-BC, [employer]

## 2024-07-02 NOTE — Progress Notes (Signed)
 Patient arrived to the unit at 1608 after coding on the regular floor.  Patient is on mechanical ventilation, in atrial fibrillation with heart rate into the 170s, triple lumen central venous line emergently placed by provider.  EKG obtained due to tachycardia, cardiology provider notified and placed orders for medications to be administered.  Patient's family came to bedside and soon after brother who was emotional on seeing patient status.  Will continue to monitor patient.

## 2024-07-02 NOTE — Progress Notes (Addendum)
 OT Cancellation Note  Patient Details Name: [name] MRN: [id number] DOB: 22-Sep-1960   Cancelled Treatment:    Reason Eval/Treat Not Completed: Patient at procedure or test/ unavailable. Consult received, chart reviewed. Pt at dialysis this morning. This afternoon was being transported to CT. Will re-attempt next date as medically appropriate.   Addendum 4:30pm: Code blue called, pt transferred to ICU. Will sign off. Please re-consult when medically appropriate.   Avalie Oconnor R., MPH, MS, OTR/L ascom [phone] 07/02/24, 3:10 PM

## 2024-07-02 NOTE — Plan of Care (Signed)
  Problem: Coping: Goal: Level of anxiety will decrease Outcome: Progressing   Problem: Pain Managment: Goal: General experience of comfort will improve and/or be controlled Outcome: Progressing   Problem: Safety: Goal: Ability to remain free from injury will improve Outcome: Progressing   Problem: Skin Integrity: Goal: Risk for impaired skin integrity will decrease Outcome: Progressing

## 2024-07-02 NOTE — Progress Notes (Signed)
 CCMD notified unit secretary that patient was in vfib. Nearby RN was notified who then went to go assess patient at bedside. Patient was unresponsive so CODE BLUE was activated. This RN responded to overhead CODE BLUE announcement for patient and went to patient bedside. Code Blue team, RT and attending MD to bedside. Patient was stabilized and immediately transferred to ICU rm 14. Bedside report given to ICU RN. Patient belongings brought to new room.

## 2024-07-02 NOTE — Progress Notes (Addendum)
 PT Cancellation Note  Patient Details Name: [name] MRN: [id number] DOB: 05/05/1961   Cancelled Treatment:    Reason Eval/Treat Not Completed: Patient at procedure or test/unavailable Pt attempt x 2 Pt receiving dialysis and afterwards transporting to imaging. PT to check back at later time.   Addendum 4:30pm: Code blue called, pt transferred to ICU. Will sign off. Please re-consult when medically appropriate.    Donis Pinder A Tacori Kvamme 07/02/2024, 2:23 PM

## 2024-07-02 NOTE — Progress Notes (Addendum)
 Hemodialysis Note:  Received patient in bed. Alert and oriented. C/O of neck pain 8/10 Informed consent singed and in chart.  Treatment initiated: 1054 Treatment completed: 1400  Access used: Right internal jugular catheter Access issues: None  Patient tolerated well, alert but still on pain. Report given to patient's RN.  Total UF removed: 2 Liters Medications given during treatment: Given by floor nurse Oxycodone  5 mg Tablet, Midodrine  10 mg Tablet, Fentanyl  12.5 mcg IV, Atarax  50 mg tablet  Post HD weight: 67 Kg  [facility]

## 2024-07-02 NOTE — Procedures (Signed)
 Central Venous Catheter Placement:  TRIPLE LUMEN     Procedure: Insertion of Non-tunneled Central Venous Catheter(36556) with US  guidance (23062)    Indication(s) Medication administration and Difficult access  CVP monitoring Patient receiving vesicant or irritant drug.; Patient receiving intravenous therapy for longer than 5 days.; Patient has limited or no vascular access.      Consent Risks of the procedure as well as the alternatives and risks of each were explained to the patient and/or caregiver.  Consent for the procedure was obtained and is signed in the bedside chart   Consent-verbal/written         Timeout Verified patient identification, verified procedure, site/side was marked, verified correct patient position, special equipment/implants available, medications/allergies/relevant history reviewed, required imaging and test results available. Patient comfort was obtained.     Sterile Technique Maximal sterile technique including full sterile barrier drape, hand hygiene, sterile gown, sterile gloves, mask, hair covering, sterile ultrasound probe cover (if used).   Hand washing performed prior to starting the procedure.      Procedure Description Area of catheter insertion was cleaned with chlorhexidine  and draped in sterile fashion.  With real-time ultrasound guidance a central venous catheter was placed into the right internal jugular vein. Nonpulsatile blood flow and easy flushing noted in all ports.  The catheter was sutured in place and sterile dressing applied.   A triple lumen catheter was placed in LEFT FEMORAL Vein There was good blood return, catheter caps were placed on lumens, catheter flushed easily, the line was secured and a sterile dressing and dressing applied.      Complications/Tolerance None; patient tolerated the procedure well. Chest X-ray is ordered to verify placement     EBL Minimal   Specimen(s) None     Number of Attempts:  1 Complications:none Estimated Blood Loss: none    Amir Fick, M.D.  Pulmonary & Critical Care Medicine  [facility]

## 2024-07-02 NOTE — Consult Note (Addendum)
 "  NAME:  [name], MRN:  [id number], DOB:  [birth year], LOS: 0 ADMISSION DATE:  07/01/2024, CONSULTATION DATE:  06/22/24 REFERRING MD:  Dr. Dorothyann, CHIEF COMPLAINT:  syncope   History of Present Illness:  [name] is a 64 y.o. female with a pmh of of ESRD on HD (MWF), COPD, CHF, bipolar disorder, chronic hypotension on midodrine , HLD and prior CVA who presented to [facility] ED via EMS following syncopal episode.   History was gathered per chart review Per patient, her normal dialysis schedule is MWF.  Patient was recently admitted to the intensive care unit earlier this month July 03, 2024.  At that time she was treated for acute on chronic hypoxemia with circulatory shock since then she has been discharged home and while at home was found down by her family and was brought into the hospital.  After receiving medical management on the mat on the floor and dialysis patient had episode of pulselessness with wide complex tachyarrhythmia.  Rapid response was initiated and ACLS was started on patient.  She received 2 rounds of epinephrine  as well as shock with return of spontaneous circulation.  1 dose of IV amiodarone  was delivered and cardiology was contacted with initiation of amiodarone  infusion.  Patient was brought into the ICU on ventilator and intubated by EDP during CODE BLUE.   Pertinent Medical History  Hypotension & Hypertension HLD HFrEF ESRD on HD Stroke CAD s/p CABG x 3 RBBB Pulmonary hypertension PVD Ischemic cardiomyopathy Hyperkalemia Hypothyroidism Atrial fibrillation on eliquis  Anemia of chronic kidney disease   Significant Hospital Events: Including procedures, antibiotic start and stop dates in addition to other pertinent events   07/02/24-transferred to ICU post CODE BLUE on medical floor status post ACLS with ROSC    Objective    Blood pressure (!) 130/91, pulse 70, temperature 98.2 F (36.8 C), temperature source Axillary, resp. rate (!) 23,  height 5' 6 (1.676 m), weight 67 kg, SpO2 97%.    FiO2 (%):  [100 %] 100 %   Intake/Output Summary (Last 24 hours) at 07/02/2024 1610 Last data filed at 07/02/2024 1400 Gross per 24 hour  Intake 240 ml  Output 2000 ml  Net -1760 ml   Filed Weights   07/01/24 1132 07/02/24 1038 07/02/24 1400  Weight: 70.5 kg 69 kg 67 kg    Examination: General: Adult female, acutely ill, lying in bed in mild distress d/t heel pain HENT: atraumatic, normocephalic, neck supple  Cardiovascular: ventricular rhythm, S1 S2, no r/m/g Pulm: Regular, non labored on [place] , breath sounds equal throughout GI: soft, non-tender, non-distended, no rebound/guarding, bs x 4 Extremities: warm/dry, pulses + 2 R/P, no edema noted Neuro: A&O x 4, follows commands, no focal neuro deficits, PERRL  GU: deferred   Assessment and Plan   # Cardiac arrest Hx: BiVfailure, CAD s/p CABG, Ischemic Cardiomyopathy, CVA       Wide-complex tachyarrhythmia status post ACLS with shock and return of spontaneous circulation.  Patient currently on mechanical ventilation status post endotracheal intubation.  Cardiology consultation placed-appreciate input Dr. Kerwin  infusion initiated        -hx of AF with biventricular failure        -reviewed with cardiologist        - RBBB on EKG         - LR x 1 , digoxin  x 1        - s/p fem CVL on right - levophed  infusion  - Continuous cardiac  monitoring - Vasopressors to maintain MAP goal >65 ~ currently on levophed  gtt - EKG prn - ECHO ordered #COPD - xopenex  #HFrEF #Atrial Fibrillation on Eliquis  #ESRD on HD (MWF)- s/p HD #GERD #Nausea and Vomiting Hx: GERD, HCV, Diverticulosis - Monitor hepatic function as indication - Antiemetics prn - Constipation protocol prn - Diet: NPO except with meds #Chronic Thrombocytopenia #VTE prophylaxis - Trend CBC  - Transfuse if Hgb < 7 or platelets <10 - Monitor for s/sx of bleeding - Continue eliquis  5mg   BID  #Chronic Pain s/t Vascular Ulcers - percocet 5-325 mg x 1 - supportive care   Labs   CBC: Recent Labs  Lab 06/26/24 0358 06/27/24 0523 06/28/24 0226 07/01/24 1336 07/02/24 0526  WBC 5.0 5.7 6.2 12.0* 12.2*  NEUTROABS  --   --   --  10.4*  --   HGB 11.6* 11.5* 11.3* 12.7 12.3  HCT 37.4 35.5* 35.1* 38.7 37.0  MCV 100.3* 99.4 98.9 97.0 95.9  PLT 110* 103* 101* 82* 68*    Basic Metabolic Panel: Recent Labs  Lab 06/26/24 0358 06/27/24 0523 06/27/24 1431 06/28/24 0226 07/01/24 1336 07/01/24 2155 07/02/24 0526  NA 141   < > 140 141 138 135 135  K 4.0   < > 4.2 4.2 4.0 4.3 4.3  CL 98   < > 94* 96* 92* 93* 92*  CO2 24   < > 24 22 21* 19* 23  GLUCOSE 89   < > 100* 102* 70 90 106*  BUN 40*   < > 26* 32* 28* 32* 35*  CREATININE 7.21*   < > 5.25* 5.87* 5.07* 5.43* 5.79*  CALCIUM  10.1   < > 10.6* 10.5* 10.4* 10.2 10.5*  MG 2.1  --   --   --   --  2.1  --   PHOS 6.5*  --   --   --   --   --   --    < > = values in this interval not displayed.   GFR: Estimated Creatinine Clearance: 9.6 mL/min (A) (by C-G formula based on SCr of 5.79 mg/dL (H)). Recent Labs  Lab 06/27/24 0523 06/27/24 1322 06/28/24 0226 07/01/24 1336 07/02/24 0526  WBC 5.7  --  6.2 12.0* 12.2*  LATICACIDVEN  --  1.2  --  1.9  --     Liver Function Tests: Recent Labs  Lab 06/26/24 0358 07/01/24 1336 07/01/24 2155 07/02/24 0526  AST  --  36 36 37  ALT  --  15 15 14   ALKPHOS  --  71 68 72  BILITOT  --  1.2 1.2 1.3*  PROT  --  7.2 6.8 6.9  ALBUMIN  4.0 3.8 3.5 3.5   No results for input(s): LIPASE, AMYLASE in the last 168 hours. Recent Labs  Lab 07/01/24 1726  AMMONIA 15    ABG    Component Value Date/Time   HCO3 24.0 07/01/2024 2155   TCO2 26 04/20/2024 0708   ACIDBASEDEF 0.2 07/01/2024 2155   O2SAT 73.5 07/01/2024 2155     Coagulation Profile: No results for input(s): INR, PROTIME in the last 168 hours.  Cardiac Enzymes: No results for input(s): CKTOTAL, CKMB,  CKMBINDEX, TROPONINI in the last 168 hours.  HbA1C: No results found for: HGBA1C  CBG: Recent Labs  Lab 07/01/24 1625 07/01/24 1739 07/01/24 1834 07/01/24 2029 07/02/24 1550  GLUCAP 127* 112* 79 83 82    Review of Systems:    10 point review of systems unable to  conduct due to acutely comatose state on mechanical ventilation  Past Medical History:  She,  has a past medical history of Acute lacunar infarction [facility]) (03/03/2014), Adult behavior problems, Anemia of chronic renal failure, Aortic atherosclerosis, Arthritis, Atrial fibrillation and flutter (HCC), Bipolar disorder (HCC), Cardiomegaly, Cerebral microvascular disease, CHF (congestive heart failure) (HCC), Chicken pox, Cholelithiasis, Chronic respiratory failure with hypoxia (HCC) (12/19/2021), COPD (chronic obstructive pulmonary disease) (HCC), Coronary artery disease, DDD (degenerative disc disease), cervical, DDD (degenerative disc disease), thoracolumbar, Diverticulosis, Dyspnea, ESRD (end stage renal disease) on dialysis (M,W,F), GERD (gastroesophageal reflux disease), Gout, HCV (hepatitis C virus), Headache, HFrEF (heart failure with reduced ejection fraction) (HCC), History of delirium (04/12/2014), History of substance abuse (HCC), Hyperkalemia (11/13/2014), Hyperlipidemia, Hypertension, Hypotension (02/24/2014), Hypothyroidism, Ischemic cardiomyopathy, Nose colonized with MRSA (02/01/2024), NSTEMI (non-ST elevated myocardial infarction) (HCC) (10/08/2013), NSTEMI (non-ST elevated myocardial infarction) (HCC) (01/23/2002), Obesity, 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, On apixaban  therapy, Peripheral vascular disease, Pneumonia, Postoperative anemia due to acute blood loss (02/27/2014), Postoperative cerebrovascular infarction following cardiac surgery (02/21/2014), Pulmonary hypertension (HCC), RBBB (right bundle branch block), Renal osteodystrophy, S/P CABG x 3 (02/20/2014), Stroke (HCC) (05/23/2010), and  Syncope and collapse (07/25/2020).   Surgical History:   Past Surgical History:  Procedure Laterality Date   A/V FISTULAGRAM Left 03/30/2019   Procedure: A/V FISTULAGRAM;  Surgeon: Marea Selinda RAMAN, MD;  Location: [facility];  Service: Cardiovascular;  Laterality: Left;   A/V FISTULAGRAM Left 10/06/2019   Procedure: A/V FISTULAGRAM;  Surgeon: Marea Selinda RAMAN, MD;  Location: [facility];  Service: Cardiovascular;  Laterality: Left;   A/V FISTULAGRAM Left 05/19/2021   Procedure: A/V FISTULAGRAM;  Surgeon: Marea Selinda RAMAN, MD;  Location: [facility];  Service: Cardiovascular;  Laterality: Left;   A/V FISTULAGRAM Left 01/29/2022   Procedure: A/V Fistulagram;  Surgeon: Marea Selinda RAMAN, MD;  Location: [facility];  Service: Cardiovascular;  Laterality: Left;   A/V FISTULAGRAM Left 04/30/2022   Procedure: A/V Fistulagram;  Surgeon: Marea Selinda RAMAN, MD;  Location: [facility];  Service: Cardiovascular;  Laterality: Left;   A/V FISTULAGRAM Left 08/20/2022   Procedure: A/V Fistulagram;  Surgeon: Marea Selinda RAMAN, MD;  Location: [facility];  Service: Cardiovascular;  Laterality: Left;   A/V FISTULAGRAM N/A 12/03/2022   Procedure: A/V Fistulagram;  Surgeon: Marea Selinda RAMAN, MD;  Location: [facility];  Service: Cardiovascular;  Laterality: N/A;   A/V FISTULAGRAM Left 02/11/2023   Procedure: A/V Fistulagram;  Surgeon: Marea Selinda RAMAN, MD;  Location: [facility];  Service: Cardiovascular;  Laterality: Left;   A/V FISTULAGRAM Left 02/07/2024   Procedure: A/V Fistulagram;  Surgeon: Marea Selinda RAMAN, MD;  Location: [facility];  Service: Cardiovascular;  Laterality: Left;   COLONOSCOPY     COLONOSCOPY WITH PROPOFOL  N/A 04/22/2021   Procedure: COLONOSCOPY WITH PROPOFOL ;  Surgeon: Maryruth Ole DASEN, MD;  Location: [facility];  Service: Endoscopy;  Laterality: N/A;   CORONARY ANGIOPLASTY WITH STENT PLACEMENT Left 01/25/2002   Procedure: CORONARY  ANGIOPLASTY WITH STENT PLACEMENT; Location: [facility]; Surgeon: Margie Lovelace, MD   CORONARY ARTERY BYPASS GRAFT N/A 02/20/2014   Procedure: CORONARY ARTERY BYPASS GRAFT; Location: [facility]; Surgeon: Reyes Fruits, MD   DIALYSIS FISTULA CREATION     DIALYSIS/PERMA CATHETER INSERTION N/A 04/08/2023   Procedure: DIALYSIS/PERMA CATHETER INSERTION;  Surgeon: Marea Selinda RAMAN, MD;  Location: [facility];  Service: Cardiovascular;  Laterality: N/A;   DIALYSIS/PERMA CATHETER REMOVAL N/A  07/06/2023   Procedure: DIALYSIS/PERMA CATHETER REMOVAL;  Surgeon: [name] Cordella MATSU, MD;  Location: [facility];  Service: Cardiovascular;  Laterality: N/A;   ESOPHAGOGASTRODUODENOSCOPY     ESOPHAGOGASTRODUODENOSCOPY N/A 04/22/2021   Procedure: ESOPHAGOGASTRODUODENOSCOPY (EGD);  Surgeon: Maryruth Ole DASEN, MD;  Location: [facility] ENDOSCOPY;  Service: Endoscopy;  Laterality: N/A;   FLEXIBLE SIGMOIDOSCOPY N/A 02/23/2019   Procedure: FLEXIBLE SIGMOIDOSCOPY;  Surgeon: Gaylyn Gladis PENNER, MD;  Location: [facility] ENDOSCOPY;  Service: Endoscopy;  Laterality: N/A;   IABP INSERTION  02/20/2014   Procedure: INTRA-AORTIC BALLOON PUMP INSERTION; Location: [facility]; Surgeon: Reyes Fruits, MD   INSERTION OF ARTERIOVENOUS (AV) ARTEGRAFT ARM Right 03/09/2024   Procedure: INSERTION, GRAFT, ARTERIOVENOUS, UPPER EXTREMITY;  Surgeon: Marea Selinda RAMAN, MD;  Location: [facility];  Service: Vascular;  Laterality: Right;  BRACHIAL AXILLARY   INSERTION OF DIALYSIS CATHETER N/A 04/20/2024   Procedure: INSERTION OF DIALYSIS CATHETER;  Surgeon: Marea Selinda RAMAN, MD;  Location: [facility];  Service: Vascular;  Laterality: N/A;   LEFT HEART CATH AND CORONARY ANGIOGRAPHY Left 01/24/2002   Procedure: LEFT HEART CATH AND CORONARY ANGIOGRAPHY; Location: [facility]; Surgeon: Margie Lovelace, MD   LEFT HEART CATH AND CORONARY ANGIOGRAPHY Left 05/25/2012   Procedure: LEFT HEART CATH AND CORONARY ANGIOGRAPHY; Location: [facility]; Surgeon: Margie Lovelace, MD   LEFT HEART CATH AND  CORONARY ANGIOGRAPHY Left 10/09/2013   Procedure: LEFT HEART CATH AND CORONARY ANGIOGRAPHY; Location: [facility]; Surgeon: Vinie Jude, MD   LEFT HEART CATH AND CORS/GRAFTS ANGIOGRAPHY N/A 08/08/2018   Procedure: LEFT HEART CATH AND CORS/GRAFTS ANGIOGRAPHY;  Surgeon: Fernand Denyse LABOR, MD;  Location: [facility];  Service: Cardiovascular;  Laterality: N/A;   LEFT HEART CATH AND CORS/GRAFTS ANGIOGRAPHY N/A 01/30/2019   Procedure: LEFT HEART CATH AND CORS/GRAFTS ANGIOGRAPHY;  Surgeon: Fernand Denyse LABOR, MD;  Location: [facility];  Service: Cardiovascular;  Laterality: N/A;   LIGATION ARTERIOVENOUS GORTEX GRAFT Right 04/20/2024   Procedure: LIGATION ARTERIOVENOUS GORTEX GRAFT;  Surgeon: Marea Selinda RAMAN, MD;  Location: [facility];  Service: Vascular;  Laterality: Right;   LOWER EXTREMITY ANGIOGRAPHY Left 04/24/2024   Procedure: Lower Extremity Angiography;  Surgeon: Marea Selinda RAMAN, MD;  Location: [facility];  Service: Cardiovascular;  Laterality: Left;   ultrasound guided pericardiocentesis     UPPER EXTREMITY ANGIOGRAPHY Right 03/30/2024   Procedure: Upper Extremity Angiography;  Surgeon: Marea Selinda RAMAN, MD;  Location: [facility];  Service: Cardiovascular;  Laterality: Right;     Social History:   reports that she has been smoking cigarettes. She has a 5 pack-year smoking history. She has never used smokeless tobacco. She reports that she does not drink alcohol and does not use drugs.   Family History:  Her family history includes Breast cancer in her maternal aunt and maternal grandmother; Cancer in an other family member; Diabetes type II in her maternal grandmother and mother; Hypertension in her father, mother, sister, and another family member; Kidney disease in her father; Ovarian cancer in her mother; Renal Disease in an other family member.   Allergies Allergies[1]   Home Medications  Prior to Admission medications  Medication Sig Start Date End Date Taking?  Authorizing Provider  albuterol  (VENTOLIN  HFA) 108 (90 Base) MCG/ACT inhaler Inhale 1-2 puffs into the lungs every 6 (six) hours as needed for wheezing or shortness of breath. 01/04/24   Orlean Alan HERO, FNP  apixaban  (ELIQUIS ) 5 MG TABS tablet Take 5 mg by mouth 2 (two) times daily. 09/11/19   [provider]  cephALEXin  (KEFLEX ) 500 MG capsule Take 1 capsule (500 mg total) by mouth 2 (two) times daily. 05/23/24   Fernand Fredy RAMAN, MD  colchicine  0.6 MG tablet Take 1 tablet (0.6 mg total) by mouth once for 1 dose. Patient not taking: Reported on 05/23/2024 04/13/24 05/19/24  Scoggins, [facility], NP  doxycycline  (VIBRA -TABS) 100 MG tablet Take 1 tablet (100 mg total) by mouth 2 (two) times daily. Patient not taking: Reported on 05/23/2024 05/09/24   Janit Thresa HERO, DPM  hydrOXYzine  (ATARAX /VISTARIL ) 50 MG tablet Take 50 mg by mouth at bedtime.    [provider]  levothyroxine  (SYNTHROID ) 75 MCG tablet Take 75 mcg by mouth daily before breakfast.  11/02/18   [provider]  lidocaine -prilocaine  (EMLA ) cream Apply 1 Application topically once. 01/22/21   [provider]  metoprolol  succinate (TOPROL -XL) 25 MG 24 hr tablet Take 1 tablet by mouth every morning. 08/27/23 08/26/24  [provider]  midodrine  (PROAMATINE ) 10 MG tablet Take 1 tablet (10 mg total) by mouth 3 (three) times daily. 02/17/24   Fernand Denyse LABOR, MD  oxyCODONE -acetaminophen  (PERCOCET) 5-325 MG tablet Take 1 tablet by mouth 2 (two) times daily as needed for severe pain (pain score 7-10). 05/23/24   Fernand Fredy RAMAN, MD  pantoprazole  (PROTONIX ) 40 MG tablet Take 40 mg by mouth every morning. 11/29/23   [provider]  ranolazine  (RANEXA ) 1000 MG SR tablet Take 1,000 mg by mouth 2 (two) times daily. 03/18/23   [provider]  rosuvastatin  (CRESTOR ) 20 MG tablet Take 20 mg by mouth at bedtime. 11/07/21   [provider]  sevelamer  carbonate (RENVELA ) 800 MG tablet Take 3,200 mg by  mouth 3 (three) times daily with meals.    [provider]  triamcinolone  cream (KENALOG ) 0.1 % Apply 1 Application topically 2 (two) times daily. 11/02/23   Orlean Alan HERO, FNP     Critical care provider statement:   Total critical care time: 63 minutes   Performed by: Parris MD   Critical care time was exclusive of separately billable procedures and treating other patients.   Critical care was necessary to treat or prevent imminent or life-threatening deterioration.   Critical care was time spent personally by me on the following activities: development of treatment plan with patient and/or surrogate as well as nursing, discussions with consultants, evaluation of patient's response to treatment, examination of patient, obtaining history from patient or surrogate, ordering and performing treatments and interventions, ordering and review of laboratory studies, ordering and review of radiographic studies, pulse oximetry and re-evaluation of patient's condition.    Alanta Scobey, M.D.  Pulmonary & Critical Care Medicine             [1]  Allergies Allergen Reactions   Morphine  And Codeine Hives   Levaquin [Levofloxacin] Itching and Other (See Comments)    Severe itching; prickly sensation; severe left leg pain; immobility.   Enalapril  Other (See Comments)    hallucinations   Latex Rash   Tape Rash and Other (See Comments)   "

## 2024-07-02 NOTE — Consult Note (Signed)
 " CARDIOLOGY CONSULT NOTE               Patient ID: [name] MRN: [id number] DOB/AGE: 1960-10-05 64 y.o.  Admit date: 07/01/2024 Referring Physician Dr. Devaughn Ban  hospitalist Primary Physician Alan Arrant, FNP Primary Cardiologist Dr GORMAN Bathe cardiologist Reason for Consultation syncope  HPI: History of 64 year old female with multiple medical problems end-stage renal disease on dialysis Monday Wednesday Friday COPD hypoxemia on home O2 atrial fibrillation on Eliquis  systolic cardiomyopathy EF around 25 to 30% multivessel coronary disease including coronary bypass surgery history of significant device infection requiring removal of AICD severe peripheral vascular disease hypertension chronic pain previous CVA bipolar disorder was recently admitted for chronic leg pain rest pain.  There were no interventional procedures likely to benefit show she was treated medically patient now returns with reported episode of syncope found on the floor by relative EKGs remained unchanged patient presented lethargic weak but arousable troponins were relatively flat now here for further evaluation for questionable syncope  Review of systems complete and found to be negative unless listed above     Past Medical History:  Diagnosis Date   Acute lacunar infarction (HCC) 03/03/2014   a.) MRI brain 03/03/2014: two acute punctate lacunar infarcts anteriorly in the BILATERAL centrum semiovale   Adult behavior problems    Anemia of chronic renal failure    Aortic atherosclerosis    Arthritis    Atrial fibrillation and flutter (HCC)    a.) CHA2DS2VASc = 6 (sex, HFrEF, HTN, CVA x 2, prior MI/vascular disease) as of 02/04/2024; b.) rate/rhythm maintained on oral metoprolol  succinate; chronically anticoagulated with apixaban    Bipolar disorder (HCC)    Cardiomegaly    Cerebral microvascular disease    CHF (congestive heart failure) (HCC)    Chicken pox    Cholelithiasis    Chronic respiratory  failure with hypoxia (HCC) 12/19/2021   COPD (chronic obstructive pulmonary disease) (HCC)    Coronary artery disease    DDD (degenerative disc disease), cervical    DDD (degenerative disc disease), thoracolumbar    Diverticulosis    Dyspnea    ESRD (end stage renal disease) on dialysis (M,W,F)    GERD (gastroesophageal reflux disease)    Gout    HCV (hepatitis C virus)    Headache    HFrEF (heart failure with reduced ejection fraction) (HCC)    History of delirium 04/12/2014   History of substance abuse (HCC)    a.) tobacco + cocaine   Hyperkalemia 11/13/2014   Hyperlipidemia    Hypertension    Hypotension 02/24/2014   Hypothyroidism    Ischemic cardiomyopathy    Nose colonized with MRSA 02/01/2024   a.) presurgical PCR (+) on 02/01/2024 prior to A/V FISTULAGRAM; b.) presurgical PCR (+) on 03/07/2024 prior to AV FISTULA CREATION   NSTEMI (non-ST elevated myocardial infarction) (HCC) 10/08/2013   NSTEMI (non-ST elevated myocardial infarction) (HCC) 01/23/2002   a.) LHC 01/24/2002 --> multi-vessel CAD with IRA being 90% OM2 --> delayed PCI until 01/25/2002 --> 3.0 x 23 mm BX Velocity stent   Obesity    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    On apixaban  therapy    Peripheral vascular disease    Pneumonia    Postoperative anemia due to acute blood loss 02/27/2014   Postoperative cerebrovascular infarction following cardiac surgery 02/21/2014   a.) developed RIGHT upper/lower extremity weakness following cardiac revascularization --> neuro consult and head CT --> Hypodensity noted in the area of the  LEFT motor cortex consistent with a component of acute ischemia   Pulmonary hypertension (HCC)    RBBB (right bundle branch block)    Renal osteodystrophy    S/P CABG x 3 02/20/2014   a.) LIMA-LAD, SVG-OM1, SVG-PDA   Stroke (HCC) 05/23/2010   a.) brain MRI 05/23/2010: BILATERAL acute infarcts and old prior infarcts; RIGHT frontal lobe extending into the white matter. Small  focus of restricted diffusion in the cortex of the medial LEFT frontoparietal lobe. Periventricular T2 and multiple foci of T2 hyperintensity in the subcortical white matter and increased FLAIR signal in the cortex of the LEFT frontal lobe.   Syncope and collapse 07/25/2020    Past Surgical History:  Procedure Laterality Date   A/V FISTULAGRAM Left 03/30/2019   Procedure: A/V FISTULAGRAM;  Surgeon: Marea Selinda RAMAN, MD;  Location: [facility];  Service: Cardiovascular;  Laterality: Left;   A/V FISTULAGRAM Left 10/06/2019   Procedure: A/V FISTULAGRAM;  Surgeon: Marea Selinda RAMAN, MD;  Location: [facility];  Service: Cardiovascular;  Laterality: Left;   A/V FISTULAGRAM Left 05/19/2021   Procedure: A/V FISTULAGRAM;  Surgeon: Marea Selinda RAMAN, MD;  Location: [facility];  Service: Cardiovascular;  Laterality: Left;   A/V FISTULAGRAM Left 01/29/2022   Procedure: A/V Fistulagram;  Surgeon: Marea Selinda RAMAN, MD;  Location: [facility];  Service: Cardiovascular;  Laterality: Left;   A/V FISTULAGRAM Left 04/30/2022   Procedure: A/V Fistulagram;  Surgeon: Marea Selinda RAMAN, MD;  Location: [facility];  Service: Cardiovascular;  Laterality: Left;   A/V FISTULAGRAM Left 08/20/2022   Procedure: A/V Fistulagram;  Surgeon: Marea Selinda RAMAN, MD;  Location: [facility];  Service: Cardiovascular;  Laterality: Left;   A/V FISTULAGRAM N/A 12/03/2022   Procedure: A/V Fistulagram;  Surgeon: Marea Selinda RAMAN, MD;  Location: [facility];  Service: Cardiovascular;  Laterality: N/A;   A/V FISTULAGRAM Left 02/11/2023   Procedure: A/V Fistulagram;  Surgeon: Marea Selinda RAMAN, MD;  Location: [facility];  Service: Cardiovascular;  Laterality: Left;   A/V FISTULAGRAM Left 02/07/2024   Procedure: A/V Fistulagram;  Surgeon: Marea Selinda RAMAN, MD;  Location: [facility];  Service: Cardiovascular;  Laterality: Left;   COLONOSCOPY     COLONOSCOPY WITH PROPOFOL  N/A 04/22/2021    Procedure: COLONOSCOPY WITH PROPOFOL ;  Surgeon: Maryruth Ole DASEN, MD;  Location: [facility];  Service: Endoscopy;  Laterality: N/A;   CORONARY ANGIOPLASTY WITH STENT PLACEMENT Left 01/25/2002   Procedure: CORONARY ANGIOPLASTY WITH STENT PLACEMENT; Location: [facility]; Surgeon: Margie Lovelace, MD   CORONARY ARTERY BYPASS GRAFT N/A 02/20/2014   Procedure: CORONARY ARTERY BYPASS GRAFT; Location: [facility]; Surgeon: Reyes Fruits, MD   DIALYSIS FISTULA CREATION     DIALYSIS/PERMA CATHETER INSERTION N/A 04/08/2023   Procedure: DIALYSIS/PERMA CATHETER INSERTION;  Surgeon: Marea Selinda RAMAN, MD;  Location: [facility];  Service: Cardiovascular;  Laterality: N/A;   DIALYSIS/PERMA CATHETER REMOVAL N/A 07/06/2023   Procedure: DIALYSIS/PERMA CATHETER REMOVAL;  Surgeon: [name] Cordella MATSU, MD;  Location: [facility];  Service: Cardiovascular;  Laterality: N/A;   ESOPHAGOGASTRODUODENOSCOPY     ESOPHAGOGASTRODUODENOSCOPY N/A 04/22/2021   Procedure: ESOPHAGOGASTRODUODENOSCOPY (EGD);  Surgeon: Maryruth Ole DASEN, MD;  Location: [facility];  Service: Endoscopy;  Laterality: N/A;   FLEXIBLE SIGMOIDOSCOPY N/A 02/23/2019   Procedure: FLEXIBLE SIGMOIDOSCOPY;  Surgeon: Gaylyn Gladis PENNER, MD;  Location: [facility]COPY;  Service: Endoscopy;  Laterality: N/A;   IABP INSERTION  02/20/2014   Procedure:  INTRA-AORTIC BALLOON PUMP INSERTION; Location: [facility]; Surgeon: Reyes Fruits, MD   INSERTION OF ARTERIOVENOUS (AV) ARTEGRAFT ARM Right 03/09/2024   Procedure: INSERTION, GRAFT, ARTERIOVENOUS, UPPER EXTREMITY;  Surgeon: Marea Selinda RAMAN, MD;  Location: [facility];  Service: Vascular;  Laterality: Right;  BRACHIAL AXILLARY   INSERTION OF DIALYSIS CATHETER N/A 04/20/2024   Procedure: INSERTION OF DIALYSIS CATHETER;  Surgeon: Marea Selinda RAMAN, MD;  Location: [facility];  Service: Vascular;  Laterality: N/A;   LEFT HEART CATH AND CORONARY ANGIOGRAPHY Left 01/24/2002   Procedure: LEFT HEART CATH AND CORONARY ANGIOGRAPHY;  Location: [facility]; Surgeon: Margie Lovelace, MD   LEFT HEART CATH AND CORONARY ANGIOGRAPHY Left 05/25/2012   Procedure: LEFT HEART CATH AND CORONARY ANGIOGRAPHY; Location: [facility]; Surgeon: Margie Lovelace, MD   LEFT HEART CATH AND CORONARY ANGIOGRAPHY Left 10/09/2013   Procedure: LEFT HEART CATH AND CORONARY ANGIOGRAPHY; Location: [facility]; Surgeon: Vinie Jude, MD   LEFT HEART CATH AND CORS/GRAFTS ANGIOGRAPHY N/A 08/08/2018   Procedure: LEFT HEART CATH AND CORS/GRAFTS ANGIOGRAPHY;  Surgeon: Fernand Denyse LABOR, MD;  Location: [facility];  Service: Cardiovascular;  Laterality: N/A;   LEFT HEART CATH AND CORS/GRAFTS ANGIOGRAPHY N/A 01/30/2019   Procedure: LEFT HEART CATH AND CORS/GRAFTS ANGIOGRAPHY;  Surgeon: Fernand Denyse LABOR, MD;  Location: [facility];  Service: Cardiovascular;  Laterality: N/A;   LIGATION ARTERIOVENOUS GORTEX GRAFT Right 04/20/2024   Procedure: LIGATION ARTERIOVENOUS GORTEX GRAFT;  Surgeon: Marea Selinda RAMAN, MD;  Location: [facility];  Service: Vascular;  Laterality: Right;   LOWER EXTREMITY ANGIOGRAPHY Left 04/24/2024   Procedure: Lower Extremity Angiography;  Surgeon: Marea Selinda RAMAN, MD;  Location: [facility];  Service: Cardiovascular;  Laterality: Left;   ultrasound guided pericardiocentesis     UPPER EXTREMITY ANGIOGRAPHY Right 03/30/2024   Procedure: Upper Extremity Angiography;  Surgeon: Marea Selinda RAMAN, MD;  Location: [facility];  Service: Cardiovascular;  Laterality: Right;    Medications Prior to Admission  Medication Sig Dispense Refill Last Dose/Taking   albuterol  (VENTOLIN  HFA) 108 (90 Base) MCG/ACT inhaler Inhale 1-2 puffs into the lungs every 6 (six) hours as needed for wheezing or shortness of breath. 20.1 g 3 Unknown   apixaban  (ELIQUIS ) 5 MG TABS tablet Take 5 mg by mouth 2 (two) times daily.   06/30/2024   clobetasol cream (TEMOVATE) 0.05 % Apply 1 Application topically daily.   Unknown   hydrOXYzine  (ATARAX /VISTARIL ) 50 MG tablet Take 50 mg by  mouth 2 (two) times daily.   06/30/2024   levothyroxine  (SYNTHROID ) 75 MCG tablet Take 75 mcg by mouth daily before breakfast.    06/30/2024   lidocaine -prilocaine  (EMLA ) cream Apply 1 Application topically once.   Unknown   [Paused] metoprolol  succinate (TOPROL -XL) 25 MG 24 hr tablet Take 1 tablet by mouth every morning.   Taking   midodrine  (PROAMATINE ) 10 MG tablet Take 1 tablet (10 mg total) by mouth 3 (three) times daily. 90 tablet 0 06/30/2024   oxyCODONE -acetaminophen  (PERCOCET) 5-325 MG tablet Take 1 tablet by mouth every 6 (six) hours as needed for severe pain (pain score 7-10). 20 tablet 0 07/01/2024 at  4:00 AM   pantoprazole  (PROTONIX ) 40 MG tablet Take 1 tablet (40 mg total) by mouth every morning. 30 tablet 1 06/30/2024   colchicine  0.6 MG tablet Take 1 tablet (0.6 mg total) by mouth once for 1 dose. (Patient not taking: Reported on 05/23/2024) 1 tablet 0    pregabalin (LYRICA) 50 MG capsule Take 50 mg by mouth daily. (Patient  not taking: Reported on 06/23/2024)   Not Taking   ranolazine  (RANEXA ) 1000 MG SR tablet Take 1,000 mg by mouth 2 (two) times daily. (Patient not taking: Reported on 06/23/2024)   Not Taking   rosuvastatin  (CRESTOR ) 20 MG tablet Take 20 mg by mouth at bedtime. (Patient not taking: Reported on 06/23/2024)   Not Taking   sevelamer  carbonate (RENVELA ) 800 MG tablet Take 3,200 mg by mouth 3 (three) times daily with meals. (Patient not taking: Reported on 06/23/2024)   Not Taking   triamcinolone  cream (KENALOG ) 0.1 % Apply 1 Application topically 2 (two) times daily. (Patient not taking: Reported on 06/23/2024) 453.6 g 2 Not Taking   venlafaxine  (EFFEXOR ) 37.5 MG tablet Take 37.5 mg by mouth 2 (two) times daily with a meal. (Patient not taking: Reported on 06/23/2024)   Not Taking   Social History   Socioeconomic History   Marital status: Single    Spouse name: Not on file   Number of children: Not on file   Years of education: Not on file   Highest education level: Not on file   Occupational History   Not on file  Tobacco Use   Smoking status: Every Day    Current packs/day: 0.25    Average packs/day: 0.3 packs/day for 20.0 years (5.0 ttl pk-yrs)    Types: Cigarettes   Smokeless tobacco: Never   Tobacco comments:    smokes 1 - 1.5 cigarettes a day  Vaping Use   Vaping status: Never Used  Substance and Sexual Activity   Alcohol use: No    Comment: social   Drug use: No   Sexual activity: Not Currently    Birth control/protection: Post-menopausal  Other Topics Concern   Not on file  Social History Narrative   Lives alone   Social Drivers of Health   Tobacco Use: High Risk (07/01/2024)   Patient History    Smoking Tobacco Use: Every Day    Smokeless Tobacco Use: Never    Passive Exposure: Not on file  Financial Resource Strain: Low Risk  (05/31/2024)   [facility]   Overall Financial Resource Strain (CARDIA)    Difficulty of Paying Living Expenses: Not hard at all  Food Insecurity: No Food Insecurity (07/02/2024)   Epic    Worried About Radiation Protection Practitioner of Food in the Last Year: Never true    Ran Out of Food in the Last Year: Never true  Transportation Needs: No Transportation Needs (07/02/2024)   Epic    Lack of Transportation (Medical): No    Lack of Transportation (Non-Medical): No  Physical Activity: Not on file  Stress: Not on file  Social Connections: Not on file  Intimate Partner Violence: Not At Risk (07/02/2024)   Epic    Fear of Current or Ex-Partner: No    Emotionally Abused: No    Physically Abused: No    Sexually Abused: No  Depression (PHQ2-9): Low Risk (07/06/2023)   Depression (PHQ2-9)    PHQ-2 Score: 3  Alcohol Screen: Not on file  Housing: Low Risk (07/02/2024)   Epic    Unable to Pay for Housing in the Last Year: No    Number of Times Moved in the Last Year: 0    Homeless in the Last Year: No  Utilities: Not At Risk (07/02/2024)   Epic    Threatened with loss of utilities: No  Health  Literacy: Not on file    Family History  Problem Relation Age  of Onset   Hypertension Mother    Ovarian cancer Mother    Diabetes type II Mother    Hypertension Father    Kidney disease Father    Hypertension Sister    Diabetes type II Maternal Grandmother    Breast cancer Maternal Grandmother    Breast cancer Maternal Aunt    Hypertension Other    Cancer Other    Renal Disease Other       Review of systems complete and found to be negative unless listed above      PHYSICAL EXAM  General: Well developed, well nourished, in no acute distress HEENT:  Normocephalic and atramatic Neck:  No JVD.  Lungs: Clear bilaterally to auscultation and percussion. Heart: HRRR . Normal S1 and S2 without gallops or murmurs.  Abdomen: Bowel sounds are positive, abdomen soft and non-tender  Msk:  Back normal, normal gait. Normal strength and tone for age. Extremities: No significant peripheral pulses discoloration chronic venous stasis changes Neuro: Alert and oriented X 3. Psych:  Good affect, responds appropriately  Labs:   Lab Results  Component Value Date   WBC 12.2 (H) 07/02/2024   HGB 12.3 07/02/2024   HCT 37.0 07/02/2024   MCV 95.9 07/02/2024   PLT 68 (L) 07/02/2024    Recent Labs  Lab 07/02/24 0526  NA 135  K 4.3  CL 92*  CO2 23  BUN 35*  CREATININE 5.79*  CALCIUM  10.5*  PROT 6.9  BILITOT 1.3*  ALKPHOS 72  ALT 14  AST 37  GLUCOSE 106*   Lab Results  Component Value Date   CKTOTAL 42 06/23/2014   CKMB 1.2 06/23/2014   TROPONINI 0.07 (HH) 08/25/2018    Lab Results  Component Value Date   CHOL 140 01/18/2019   CHOL 79 06/14/2014   CHOL 127 02/10/2014   Lab Results  Component Value Date   HDL 43 01/18/2019   HDL 35 (L) 06/14/2014   HDL 53 02/10/2014   Lab Results  Component Value Date   LDLCALC 67 01/18/2019   LDLCALC 27 06/14/2014   LDLCALC 53 02/10/2014   Lab Results  Component Value Date   TRIG 148 01/18/2019   TRIG 85 06/14/2014   TRIG  105 02/10/2014   Lab Results  Component Value Date   CHOLHDL 3.3 01/18/2019   No results found for: LDLDIRECT    Radiology: CT Cervical Spine Wo Contrast Result Date: 07/01/2024 EXAM: CT CERVICAL SPINE WITHOUT CONTRAST 07/01/2024 03:13:30 PM TECHNIQUE: CT of the cervical spine was performed without the administration of intravenous contrast. Multiplanar reformatted images are provided for review. Automated exposure control, iterative reconstruction, and/or weight based adjustment of the mA/kV was utilized to reduce the radiation dose to as low as reasonably achievable. COMPARISON: 06/22/2024 and 02/16/2022 CLINICAL HISTORY: Ataxia, cervical trauma FINDINGS: BONES AND ALIGNMENT: No acute fracture or traumatic malalignment. Focal kyphosis at C5-C6 is stable. Straightening of the normal cervical lordosis otherwise is stable. DEGENERATIVE CHANGES: Chronic endplate changes at C5-C6. SOFT TISSUES: Adjacent soft tissues are unremarkable. Calcifications are again seen within the thyroid  bilaterally. Atherosclerotic calcifications are present in the proximal great vessels and at the carotid bifurcations without definite stenosis. A right IJ catheter is in place. No prevertebral soft tissue swelling. IMPRESSION: 1. No acute cervical spine findings. 2. Stable chronic endplate changes and focal kyphosis at C5-6. Electronically signed by: Lonni Necessary MD 07/01/2024 03:57 PM EST RP Workstation: [id number]   CT HEAD WO CONTRAST ( ) Result Date: 07/01/2024 EXAM: CT HEAD  WITHOUT CONTRAST 07/01/2024 03:13:30 PM TECHNIQUE: CT of the head was performed without the administration of intravenous contrast. Automated exposure control, iterative reconstruction, and/or weight based adjustment of the mA/kV was utilized to reduce the radiation dose to as low as reasonably achievable. COMPARISON: 06/22/2024 CLINICAL HISTORY: Head trauma, abnormal mental status (Age 40-64y). Fall today. FINDINGS: BRAIN AND VENTRICLES: No  acute hemorrhage. No evidence of acute infarct. Chronic encephalomalacia within bilateral frontal, parietal, and right occipital lobes compatible with remote infarcts. Extensive periventricular and subcortical white matter low-density changes compatible with chronic microvascular ischemic change. No hydrocephalus. No extra-axial collection. No mass effect or midline shift. ORBITS: No acute abnormality. SINUSES: No acute abnormality. SOFT TISSUES AND SKULL: No acute soft tissue abnormality. No skull fracture. Atherosclerotic calcifications in large vessels of skull base. IMPRESSION: 1. No acute intracranial abnormality. 2. Chronic encephalomalacia within bilateral frontal, parietal, and right occipital lobes compatible with remote infarcts. 3. Extensive periventricular and subcortical white matter low-density changes compatible with chronic microvascular ischemic change. Electronically signed by: Lonni Necessary MD 07/01/2024 03:54 PM EST RP Workstation: [id number]   DG Wrist Complete Right Result Date: 07/01/2024 EXAM: 3 or more VIEW(S) XRAY OF THE RIGHT WRIST 07/01/2024 12:22:33 PM COMPARISON: None available. CLINICAL HISTORY: fall FINDINGS: BONES AND JOINTS: No acute fracture. No malalignment. Subchondral cysts in the base of the first metacarpal bone with degenerative changes of the first [facility] joint. SOFT TISSUES: Unremarkable. IMPRESSION: 1. No acute fracture or dislocation. Electronically signed by: Waddell Calk MD 07/01/2024 12:51 PM EST RP Workstation: [id number]   DG Pelvis Portable Result Date: 07/01/2024 EXAM: 1 or 2 VIEW(S) XRAY OF THE PELVIS 07/01/2024 12:22:33 PM COMPARISON: 01/13/2016 CLINICAL HISTORY: fall FINDINGS: BONES AND JOINTS: No acute fracture. No malalignment. SOFT TISSUES: Likely calcified uterine fibroids. Severe atherosclerotic plaque. IMPRESSION: 1. No evidence of acute traumatic injury. Electronically signed by: Waddell Calk MD 07/01/2024 12:49 PM EST RP Workstation:  [id number]   DG Chest Portable 1 View Result Date: 07/01/2024 EXAM: 1 VIEW(S) XRAY OF THE CHEST 07/01/2024 11:45:00 AM COMPARISON: 06/25/2024 CLINICAL HISTORY: SOB FINDINGS: LINES, TUBES AND DEVICES: Right chest wall dialysis catheter in place with tip overlying right atrium. Left brachial stent noted. LUNGS AND PLEURA: Pulmonary interstitial prominence with mild pulmonary edema. Decreased left pleural effusion. No pneumothorax. HEART AND MEDIASTINUM: Cardiomegaly. Coronary artery calcifications and stents. Surgical changes in mediastinum. Aortic calcification. BONES AND SOFT TISSUES: Intact sternotomy wires. IMPRESSION: 1. Pulmonary interstitial prominence with mild pulmonary edema, similar . 2. Decreased left pleural effusion. 3. Cardiomegaly. Electronically signed by: Waddell Calk MD 07/01/2024 11:55 AM EST RP Workstation: [id number]   DG Chest 1 View Result Date: 06/25/2024 CLINICAL DATA:  Pulmonary edema. EXAM: CHEST  1 VIEW COMPARISON:  02/28/2024 and CT chest 02/16/2022. FINDINGS: Right IJ dialysis catheter terminates in the right atrium. Trachea is midline. Heart is enlarged. There may be subtle interstitial prominence bilaterally. Probable left pleural effusion. Findings appear unchanged. IMPRESSION: Probable mild interstitial pulmonary edema and small left pleural effusion. Electronically Signed   By: Newell Eke M.D.   On: 06/25/2024 11:11   ECHOCARDIOGRAM COMPLETE Result Date: 06/23/2024    ECHOCARDIOGRAM REPORT   Patient Name:   LIZANNE ERKER Date of Exam: 06/23/2024 Medical Rec #:  [id number]         Height:       66.0 in Accession #:    [id number]        Weight:       164.5 lb Date of Birth:  08-09-1960  BSA:          1.840 m Patient Age:    63 years          BP:           101/73 mmHg Patient Gender: F                 HR:           107 bpm. Exam Location:  [facility] Procedure: 2D Echo, Color Doppler and Cardiac Doppler (Both Spectral and Color            Flow Doppler were utilized  during procedure). STAT ECHO Indications:     Syncope R55  History:         Patient has prior history of Echocardiogram examinations, most                  recent 05/17/2023. COPD; Arrythmias:Atrial Fibrillation and                  Atrial Flutter.  Sonographer:     Christopher Furnace Referring Phys:  [id number] ROBET KIM Diagnosing Phys: Jeryn Bertoni D Bren Borys MD IMPRESSIONS  1. Left ventricular ejection fraction, by estimation, is 25 to 30%. The left ventricle has severely decreased function. The left ventricle demonstrates global hypokinesis. The left ventricular internal cavity size was severely dilated. Left ventricular diastolic function could not be evaluated.  2. Right ventricular systolic function is severely reduced. The right ventricular size is severely enlarged. Mildly increased right ventricular wall thickness.  3. Left atrial size was mildly dilated.  4. Right atrial size was severely dilated.  5. The mitral valve is normal in structure. Mild mitral valve regurgitation.  6. Tricuspid valve regurgitation is severe.  7. The aortic valve is grossly normal. Aortic valve regurgitation is not visualized. Aortic valve sclerosis is present, with no evidence of aortic valve stenosis. FINDINGS  Left Ventricle: Left ventricular ejection fraction, by estimation, is 25 to 30%. The left ventricle has severely decreased function. The left ventricle demonstrates global hypokinesis. Strain was performed and the global longitudinal strain is indeterminate. The left ventricular internal cavity size was severely dilated. There is borderline left ventricular hypertrophy. Left ventricular diastolic function could not be evaluated. Right Ventricle: The right ventricular size is severely enlarged. Mildly increased right ventricular wall thickness. Right ventricular systolic function is severely reduced. Left Atrium: Left atrial size was mildly dilated. Right Atrium: Right atrial size was severely dilated. Pericardium: There is no  evidence of pericardial effusion. Mitral Valve: The mitral valve is normal in structure. Mild mitral valve regurgitation. Tricuspid Valve: The tricuspid valve is grossly normal. Tricuspid valve regurgitation is severe. Aortic Valve: The aortic valve is grossly normal. Aortic valve regurgitation is not visualized. Aortic valve sclerosis is present, with no evidence of aortic valve stenosis. Aortic valve mean gradient measures 3.0 mmHg. Aortic valve peak gradient measures 4.4 mmHg. Aortic valve area, by VTI measures 2.08 cm. Pulmonic Valve: The pulmonic valve was not well visualized. Pulmonic valve regurgitation is mild. Aorta: The aortic root was not well visualized. IAS/Shunts: No atrial level shunt detected by color flow Doppler. Additional Comments: 3D was performed not requiring image post processing on an independent workstation and was indeterminate.  LEFT VENTRICLE PLAX 2D LVIDd:         6.02 cm      Diastology LVIDs:         4.64 cm      LV e' medial:  4.64 cm/s LV PW:  1.11 cm      LV e' lateral: 8.32 cm/s LV IVS:        1.31 cm LVOT diam:     2.00 cm LV SV:         28 LV SV Index:   15 LVOT Area:     3.14 cm  LV Volumes (MOD) LV vol d, MOD A2C: 172.0 ml LV vol d, MOD A4C: 173.0 ml LV vol s, MOD A2C: 168.0 ml LV vol s, MOD A4C: 119.0 ml LV SV MOD A2C:     4.0 ml LV SV MOD A4C:     173.0 ml LV SV MOD BP:      33.8 ml RIGHT VENTRICLE RV Basal diam:  4.52 cm RV Mid diam:    3.20 cm LEFT ATRIUM             Index        RIGHT ATRIUM           Index LA diam:        3.40 cm 1.85 cm/m   RA Area:     28.70 cm LA Vol (A2C):   86.5 ml 47.00 ml/m  RA Volume:   110.00 ml 59.77 ml/m LA Vol (A4C):   67.8 ml 36.84 ml/m LA Biplane Vol: 78.1 ml 42.44 ml/m  AORTIC VALVE AV Area (Vmax):    1.81 cm AV Area (Vmean):   1.64 cm AV Area (VTI):     2.08 cm AV Vmax:           105.00 cm/s AV Vmean:          79.900 cm/s AV VTI:            0.133 m AV Peak Grad:      4.4 mmHg AV Mean Grad:      3.0 mmHg LVOT Vmax:          60.60 cm/s LVOT Vmean:        41.700 cm/s LVOT VTI:          0.088 m LVOT/AV VTI ratio: 0.66  AORTA Ao Root diam: 2.30 cm TRICUSPID VALVE TR Peak grad:   45.7 mmHg TR Vmax:        338.00 cm/s  SHUNTS Systemic VTI:  0.09 m Systemic Diam: 2.00 cm [name] MD Electronically signed by [name] MD Signature Date/Time: 06/23/2024/2:44:50 PM    Final    CT Cervical Spine Wo Contrast Result Date: 06/22/2024 EXAM: CT CERVICAL SPINE WITHOUT CONTRAST 06/22/2024 09:55:46 PM TECHNIQUE: CT of the cervical spine was performed without the administration of intravenous contrast. Multiplanar reformatted images are provided for review. Automated exposure control, iterative reconstruction, and/or weight based adjustment of the mA/kV was utilized to reduce the radiation dose to as low as reasonably achievable. COMPARISON: None available. CLINICAL HISTORY: Polytrauma, blunt. FINDINGS: BONES AND ALIGNMENT: Reversal of the normal mid cervical lordosis, chronic. No acute fracture or traumatic malalignment. DEGENERATIVE CHANGES: Severe erosive endplate changes at C5-C6, chronic but progressive when compared to remote prior. Differential considerations include chronic osteomyelitis versus severe degenerative changes with bony remodeling. SOFT TISSUES: No prevertebral soft tissue swelling. IMPRESSION: 1. No traumatic injury to the cervical spine. 2. Severe erosive endplate changes at C5-C6, chronic but progressive. Differential considerations include chronic osteomyelitis versus severe degenerative changes with bony remodeling. Correlate with laboratory evaluation to exclude infection. Electronically signed by: Pinkie Pebbles MD MD 06/22/2024 10:07 PM EST RP Workstation: [id number]   CT HEAD WO CONTRAST ( ) Result  Date: 06/22/2024 EXAM: CT HEAD WITHOUT CONTRAST 06/22/2024 09:55:46 PM TECHNIQUE: CT of the head was performed without the administration of intravenous contrast. Automated exposure control, iterative  reconstruction, and/or weight based adjustment of the mA/kV was utilized to reduce the radiation dose to as low as reasonably achievable. COMPARISON: MRI brain dated 08/16/2023. CLINICAL HISTORY: Polytrauma, blunt. FINDINGS: BRAIN AND VENTRICLES: No acute hemorrhage. No evidence of acute infarct. Remote infarcts in bilateral frontal lobes and right parietal lobe. Moderate white matter disease. Intracranial atherosclerosis. No hydrocephalus. No extra-axial collection. No mass effect or midline shift. ORBITS: No acute abnormality. SINUSES: No acute abnormality. SOFT TISSUES AND SKULL: No acute soft tissue abnormality. No skull fracture. IMPRESSION: 1. No acute intracranial abnormality. 2. 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 Electronically signed by: Pinkie Pebbles MD MD 06/22/2024 10:03 PM EST RP Workstation: [id number]   DG Chest Portable 1 View Result Date: 06/22/2024 EXAM: 1 VIEW XRAY OF THE CHEST 06/22/2024 09:30:41 PM COMPARISON: 02/28/2024. Prior CAG. CLINICAL HISTORY: weakness FINDINGS: LINES, TUBES AND DEVICES: Right dialysis catheter tip in the right atrium. LUNGS AND PLEURA: Vascular congestion. No overt edema. No focal pulmonary opacity. No pleural effusion. No pneumothorax. HEART AND MEDIASTINUM: Cardiomegaly. BONES AND SOFT TISSUES: No acute osseous abnormality. IMPRESSION: 1. Cardiomegaly with vascular congestion without overt edema. Electronically signed by: Franky Crease MD 06/22/2024 09:35 PM EST RP Workstation: [id number]    EKG: Sinus rhythm right bundle branch block left posterior fascicular block unchanged from previously nonspecific findings with PVCs  ASSESSMENT AND PLAN:  Syncope End-stage renal disease on dialysis COPD hypoxemia Acute on chronic HFrEF Cardiomyopathy EF around 25 to 30% History of CVA History of atrial fibrillation Hyperlipidemia Chronic hypotension Previous history of AICD which was removed secondary to possible infection History of pericarditis Bipolar  disorder . Plan Agreed admit for observation No direct cardiac studies or procedures recommended for this chronic recurrent problem Patient has no significant peripheral vascular disease access sites as they have been exhaustive Continue dialysis management as per nephrology Continue supportive blood pressure control currently on midodrine  Continue Eliquis  therapy for atrial fibrillation and stroke prevention Recommend continued management for bipolar disorder No invasive cardiac procedures recommended  Signed: [name] MD 07/02/2024, 8:29 AM      "

## 2024-07-02 NOTE — Progress Notes (Signed)
 PHARMACY - ANTICOAGULATION CONSULT NOTE  Pharmacy Consult for UFH infusion Indication: atrial fibrillation  Allergies[1]  Patient Measurements: Height: 5' 6 (167.6 cm) Weight: 67 kg (147 lb 11.3 oz) IBW/kg (Calculated) : 59.3 HEPARIN  DW (KG): 70.5  Vital Signs: Temp: 98.2 F (36.8 C) (01/18 1608) Temp Source: Axillary (01/18 1608) BP: 151/107 (01/18 1900) Pulse Rate: 77 (01/18 1900)  Labs: Recent Labs    07/01/24 1336 07/01/24 2155 07/02/24 0526 07/02/24 1646  HGB 12.7  --  12.3 12.2  HCT 38.7  --  37.0 37.3  PLT 82*  --  68* 70*  CREATININE 5.07* 5.43* 5.79* 3.57*    Estimated Creatinine Clearance: 15.5 mL/min (A) (by C-G formula based on SCr of 3.57 mg/dL (H)).   Medical History: Past Medical History:  Diagnosis Date   Acute lacunar infarction (HCC) 03/03/2014   a.) MRI brain 03/03/2014: two acute punctate lacunar infarcts anteriorly in the BILATERAL centrum semiovale   Adult behavior problems    Anemia of chronic renal failure    Aortic atherosclerosis    Arthritis    Atrial fibrillation and flutter (HCC)    a.) CHA2DS2VASc = 6 (sex, HFrEF, HTN, CVA x 2, prior MI/vascular disease) as of 02/04/2024; b.) rate/rhythm maintained on oral metoprolol  succinate; chronically anticoagulated with apixaban    Bipolar disorder (HCC)    Cardiomegaly    Cerebral microvascular disease    CHF (congestive heart failure) (HCC)    Chicken pox    Cholelithiasis    Chronic respiratory failure with hypoxia (HCC) 12/19/2021   COPD (chronic obstructive pulmonary disease) (HCC)    Coronary artery disease    DDD (degenerative disc disease), cervical    DDD (degenerative disc disease), thoracolumbar    Diverticulosis    Dyspnea    ESRD (end stage renal disease) on dialysis (M,W,F)    GERD (gastroesophageal reflux disease)    Gout    HCV (hepatitis C virus)    Headache    HFrEF (heart failure with reduced ejection fraction) (HCC)    History of delirium 04/12/2014   History of  substance abuse (HCC)    a.) tobacco + cocaine   Hyperkalemia 11/13/2014   Hyperlipidemia    Hypertension    Hypotension 02/24/2014   Hypothyroidism    Ischemic cardiomyopathy    Nose colonized with MRSA 02/01/2024   a.) presurgical PCR (+) on 02/01/2024 prior to A/V FISTULAGRAM; b.) presurgical PCR (+) on 03/07/2024 prior to AV FISTULA CREATION   NSTEMI (non-ST elevated myocardial infarction) (HCC) 10/08/2013   NSTEMI (non-ST elevated myocardial infarction) (HCC) 01/23/2002   a.) LHC 01/24/2002 --> multi-vessel CAD with IRA being 90% OM2 --> delayed PCI until 01/25/2002 --> 3.0 x 23 mm BX Velocity stent   Obesity    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    On apixaban  therapy    Peripheral vascular disease    Pneumonia    Postoperative anemia due to acute blood loss 02/27/2014   Postoperative cerebrovascular infarction following cardiac surgery 02/21/2014   a.) developed RIGHT upper/lower extremity weakness following cardiac revascularization --> neuro consult and head CT --> Hypodensity noted in the area of the LEFT motor cortex consistent with a component of acute ischemia   Pulmonary hypertension (HCC)    RBBB (right bundle branch block)    Renal osteodystrophy    S/P CABG x 3 02/20/2014   a.) LIMA-LAD, SVG-OM1, SVG-PDA   Stroke (HCC) 05/23/2010   a.) brain MRI 05/23/2010: BILATERAL acute infarcts and old prior infarcts;  RIGHT frontal lobe extending into the white matter. Small focus of restricted diffusion in the cortex of the medial LEFT frontoparietal lobe. Periventricular T2 and multiple foci of T2 hyperintensity in the subcortical white matter and increased FLAIR signal in the cortex of the LEFT frontal lobe.   Syncope and collapse 07/25/2020    Medications:  Scheduled:   Chlorhexidine  Gluconate Cloth  6 each Topical Q0600   collagenase   1 Application Topical Daily   digoxin        levothyroxine   75 mcg Oral Q0600   midodrine   10 mg Oral TID WC   mouth rinse   15 mL Mouth Rinse Q2H   pantoprazole   40 mg Oral BH-q7a   sodium chloride  flush  10-40 mL Intracatheter Q12H   venlafaxine   37.5 mg Oral BID WC    Assessment: 64 y/o F with a h/o atrial fibrillation on apixaban . Last dose was today at 0900. Patient coded on 2024-07-11 with subsequent ROSC and is now in the ICU on ventilation and pressor support. Pharmacy consulted for heparin  bridging.   Goal of Therapy:  Heparin  level 0.3-0.7 units/ml Monitor platelets by anticoagulation protocol: Yes   Plan:  Start heparin  infusion at 1000 units/hr  Check aPTT 8 hours after starting infusion Transition to anti-Xa levels when able Daily anti-Xa/CBC  Bari Hamilton D 2024-07-11,8:48 PM      [1]  Allergies Allergen Reactions   Morphine  And Codeine Hives   Levaquin [Levofloxacin] Itching and Other (See Comments)    Severe itching; prickly sensation; severe left leg pain; immobility.   Enalapril  Other (See Comments)    hallucinations   Latex Rash   Tape Rash and Other (See Comments)

## 2024-07-02 NOTE — Significant Event (Addendum)
 I responded to code blue.  On arrival chest compressions taking place.   One dose epi ordered.   EDP arrived and made preparations to intubate.  Pulse check revealed no pulse, compressions resumed.  Rhythm check showed v-tach. Defibrillation performed and compressions resumed.  Second dose epi given.  Intubation performed  Pulse check showed ROSC.  EKG showed wide complex tachycardia. Amio bolus given along with bicarb and calcium .  Labs ordered. Glucose 82.   Cardiology notified, will see, amio infusion advised, ordered.  Sign-out given to ICU attending. Family (siblings, daughter) updated. Condition is critical and prognosis is poor.

## 2024-07-02 NOTE — Progress Notes (Signed)
 " [facility]  PROGRESS NOTE   Subjective:   Patient seen at bedside.  Comfortable.  Denies any chest pain or shortness of breath.  Being dialyzed today.  Objective:  Vital signs: Blood pressure 99/63, pulse 70, temperature (!) 97.5 F (36.4 C), temperature source Oral, resp. rate 20, height 5' 6 (1.676 m), weight 69 kg, SpO2 (!) 89%.  Intake/Output Summary (Last 24 hours) at 07/02/2024 1210 Last data filed at 07/02/2024 0914 Gross per 24 hour  Intake 240 ml  Output --  Net 240 ml   Filed Weights   07/01/24 1132 07/02/24 1038  Weight: 70.5 kg 69 kg     Physical Exam: General:  No acute distress  Head:  Normocephalic, atraumatic. Moist oral mucosal membranes  Eyes:  Anicteric  Neck:  Supple  Lungs:   Clear to auscultation, normal effort  Heart:  S1S2 no rubs  Abdomen:   Soft, nontender, bowel sounds present  Extremities:  peripheral edema.  Neurologic:  Awake, alert, following commands  Skin:  No lesions  Access:     Basic Metabolic Panel: Recent Labs  Lab 06/26/24 0358 06/27/24 0523 06/27/24 1431 06/28/24 0226 07/01/24 1336 07/01/24 2155 07/02/24 0526  NA 141   < > 140 141 138 135 135  K 4.0   < > 4.2 4.2 4.0 4.3 4.3  CL 98   < > 94* 96* 92* 93* 92*  CO2 24   < > 24 22 21* 19* 23  GLUCOSE 89   < > 100* 102* 70 90 106*  BUN 40*   < > 26* 32* 28* 32* 35*  CREATININE 7.21*   < > 5.25* 5.87* 5.07* 5.43* 5.79*  CALCIUM  10.1   < > 10.6* 10.5* 10.4* 10.2 10.5*  MG 2.1  --   --   --   --  2.1  --   PHOS 6.5*  --   --   --   --   --   --    < > = values in this interval not displayed.   GFR: Estimated Creatinine Clearance: 9.6 mL/min (A) (by C-G formula based on SCr of 5.79 mg/dL (H)).  Liver Function Tests: Recent Labs  Lab 06/26/24 0358 07/01/24 1336 07/01/24 2155 07/02/24 0526  AST  --  36 36 37  ALT  --  15 15 14   ALKPHOS  --  71 68 72  BILITOT  --  1.2 1.2 1.3*  PROT  --  7.2 6.8 6.9  ALBUMIN  4.0 3.8 3.5 3.5   No results for  input(s): LIPASE, AMYLASE in the last 168 hours. Recent Labs  Lab 07/01/24 1726  AMMONIA 15    CBC: Recent Labs  Lab 06/26/24 0358 06/27/24 0523 06/28/24 0226 07/01/24 1336 07/02/24 0526  WBC 5.0 5.7 6.2 12.0* 12.2*  NEUTROABS  --   --   --  10.4*  --   HGB 11.6* 11.5* 11.3* 12.7 12.3  HCT 37.4 35.5* 35.1* 38.7 37.0  MCV 100.3* 99.4 98.9 97.0 95.9  PLT 110* 103* 101* 82* 68*     HbA1C: No results found for: HGBA1C  Urinalysis: No results for input(s): COLORURINE, LABSPEC, PHURINE, GLUCOSEU, HGBUR, BILIRUBINUR, KETONESUR, PROTEINUR, UROBILINOGEN, NITRITE, LEUKOCYTESUR in the last 72 hours.  Invalid input(s): APPERANCEUR    Imaging: CT Cervical Spine Wo Contrast Result Date: 07/01/2024 EXAM: CT CERVICAL SPINE WITHOUT CONTRAST 07/01/2024 03:13:30 PM TECHNIQUE: CT of the cervical spine was performed without the administration of intravenous contrast. Multiplanar reformatted images are  provided for review. Automated exposure control, iterative reconstruction, and/or weight based adjustment of the mA/kV was utilized to reduce the radiation dose to as low as reasonably achievable. COMPARISON: 06/22/2024 and 02/16/2022 CLINICAL HISTORY: Ataxia, cervical trauma FINDINGS: BONES AND ALIGNMENT: No acute fracture or traumatic malalignment. Focal kyphosis at C5-C6 is stable. Straightening of the normal cervical lordosis otherwise is stable. DEGENERATIVE CHANGES: Chronic endplate changes at C5-C6. SOFT TISSUES: Adjacent soft tissues are unremarkable. Calcifications are again seen within the thyroid  bilaterally. Atherosclerotic calcifications are present in the proximal great vessels and at the carotid bifurcations without definite stenosis. A right IJ catheter is in place. No prevertebral soft tissue swelling. IMPRESSION: 1. No acute cervical spine findings. 2. Stable chronic endplate changes and focal kyphosis at C5-6. Electronically signed by: Lonni Necessary  MD 07/01/2024 03:57 PM EST RP Workstation: [id number]   CT HEAD WO CONTRAST ( ) Result Date: 07/01/2024 EXAM: CT HEAD WITHOUT CONTRAST 07/01/2024 03:13:30 PM TECHNIQUE: CT of the head was performed without the administration of intravenous contrast. Automated exposure control, iterative reconstruction, and/or weight based adjustment of the mA/kV was utilized to reduce the radiation dose to as low as reasonably achievable. COMPARISON: 06/22/2024 CLINICAL HISTORY: Head trauma, abnormal mental status (Age 45-64y). Fall today. FINDINGS: BRAIN AND VENTRICLES: No acute hemorrhage. No evidence of acute infarct. Chronic encephalomalacia within bilateral frontal, parietal, and right occipital lobes compatible with remote infarcts. Extensive periventricular and subcortical white matter low-density changes compatible with chronic microvascular ischemic change. No hydrocephalus. No extra-axial collection. No mass effect or midline shift. ORBITS: No acute abnormality. SINUSES: No acute abnormality. SOFT TISSUES AND SKULL: No acute soft tissue abnormality. No skull fracture. Atherosclerotic calcifications in large vessels of skull base. IMPRESSION: 1. No acute intracranial abnormality. 2. Chronic encephalomalacia within bilateral frontal, parietal, and right occipital lobes compatible with remote infarcts. 3. Extensive periventricular and subcortical white matter low-density changes compatible with chronic microvascular ischemic change. Electronically signed by: Lonni Necessary MD 07/01/2024 03:54 PM EST RP Workstation: [id number]   DG Wrist Complete Right Result Date: 07/01/2024 EXAM: 3 or more VIEW(S) XRAY OF THE RIGHT WRIST 07/01/2024 12:22:33 PM COMPARISON: None available. CLINICAL HISTORY: fall FINDINGS: BONES AND JOINTS: No acute fracture. No malalignment. Subchondral cysts in the base of the first metacarpal bone with degenerative changes of the first [facility] joint. SOFT TISSUES: Unremarkable. IMPRESSION: 1. No  acute fracture or dislocation. Electronically signed by: Waddell Calk MD 07/01/2024 12:51 PM EST RP Workstation: [id number]   DG Pelvis Portable Result Date: 07/01/2024 EXAM: 1 or 2 VIEW(S) XRAY OF THE PELVIS 07/01/2024 12:22:33 PM COMPARISON: 01/13/2016 CLINICAL HISTORY: fall FINDINGS: BONES AND JOINTS: No acute fracture. No malalignment. SOFT TISSUES: Likely calcified uterine fibroids. Severe atherosclerotic plaque. IMPRESSION: 1. No evidence of acute traumatic injury. Electronically signed by: Waddell Calk MD 07/01/2024 12:49 PM EST RP Workstation: [id number]   DG Chest Portable 1 View Result Date: 07/01/2024 EXAM: 1 VIEW(S) XRAY OF THE CHEST 07/01/2024 11:45:00 AM COMPARISON: 06/25/2024 CLINICAL HISTORY: SOB FINDINGS: LINES, TUBES AND DEVICES: Right chest wall dialysis catheter in place with tip overlying right atrium. Left brachial stent noted. LUNGS AND PLEURA: Pulmonary interstitial prominence with mild pulmonary edema. Decreased left pleural effusion. No pneumothorax. HEART AND MEDIASTINUM: Cardiomegaly. Coronary artery calcifications and stents. Surgical changes in mediastinum. Aortic calcification. BONES AND SOFT TISSUES: Intact sternotomy wires. IMPRESSION: 1. Pulmonary interstitial prominence with mild pulmonary edema, similar . 2. Decreased left pleural effusion. 3. Cardiomegaly. Electronically signed by: Waddell Calk MD 07/01/2024 11:55 AM EST RP Workstation: [id number]  Medications:     apixaban   5 mg Oral BID   Chlorhexidine  Gluconate Cloth  6 each Topical Q0600   collagenase   1 Application Topical Daily   hydrOXYzine   50 mg Oral BID   levothyroxine   75 mcg Oral Q0600   midodrine   10 mg Oral TID WC   pantoprazole   40 mg Oral BH-q7a   venlafaxine   37.5 mg Oral BID WC    Assessment/ Plan:     64 year old female with history of COPD, atrial fibrillation, bipolar disorder, end-stage renal disease on hemodialysis Monday Wednesday and Friday now admitted with history of fall.   She had partial dialysis treatment yesterday.  Patient's son says she always has been hypotensive.  Chest x-ray shows fluid overload.     ESRD: Will schedule for dialysis today with fluid removal. Spoke to the patient's son at bedside.   ANEMIA: Will continue to monitor closely.   MBD: Will check PTH, calcium  and phosphorus levels.   HTN/VOL: Blood pressure is stable today.  Chest x-ray shows congestive heart failure.  She was given a dose of Lasix  last night.  Patient says she did not urinate much.     Labs and medications reviewed. Will continue to follow along with you.   LOS: 0 Pinkey Edman, MD [facility] 1/18/202612:10 PM  "

## 2024-07-02 NOTE — Progress Notes (Signed)
 " PROGRESS NOTE    [name]  FMW:[id number] DOB: 03-09-1961 DOA: 07/01/2024 PCP: Orlean Alan HERO, FNP    Brief Narrative:   [name] is a pleasant 64 y.o. female with medical history significant for ESRD on hemodialysis MWF, COPD on home oxygen  2 L via nasal cannula, HFrEF with last EF 25 to 30%, A-fib on Eliquis ,, bipolar disorder, chronic hypotension on midodrine , HLD, prior history of stroke who was brought into ED when she was found down at her house floor by the brother when he went to check this morning around 9:30 AM.  Patient had a similar admission on 06/22/2024 due to fluid overload, cardiogenic shock requiring Levophed  then placed on midodrine , had a syncope and collapse at that time due to hypotension.  She had hemodialysis Friday but she could not complete due to unknown reason. Patient is alert awake and oriented but falls back to sleep when talking.  She denies any fever, chills, cough, nausea vomiting abdominal pain chest pain shortness of breath palpitations.  She complains of pain on the right wrist and she has a wound on the left heel.     Assessment & Plan:   Active Problems:   ESRD on hemodialysis (HCC)   Chronic combined systolic and diastolic CHF (congestive heart failure) (HCC)   Paroxysmal atrial fibrillation (HCC)   Chronic hypotension   Anemia of chronic disease   Mild dementia associated with other underlying disease, without behavioral disturbance, psychotic disturbance, mood disturbance, or anxiety (HCC)   Hypothyroidism   S/P CABG x 3   Hepatitis C antibody positive in blood   Other cirrhosis of liver (HCC)   Hypertension   Bipolar disorder in full remission, most recent episode unspecified type   Chronic pain syndrome   Syncope and collapse  # Found down Etiology unclear, likely multifactorial. Individual problems as below  # Debility Lives alone but not safe for that - therapy consults  # Recurrent syncope # Biventricular heart  failure Has history hypotension, currently on midodrine . At last hospitalization cardiology advised outpatient event monitor. EF 25-30 with severely reduced RV function, severe TR - f/u cardiology consult  # Encephalopathy On arrival. Awake and alert today but appears confused. No significant electrolyte abnormality - f/u blood cultures, ascites scan  # Cirrhosis With confusion will check abdominal u/s to eval for fluid, will sample if present  # Thrombocytopenia Likely 2/2 cirrhosis, has chronic mild hyponatremia, worsening here to the 60s - monitor for now  # Posterior neck pain Acute this morning. CT neck negative last night but this is a new complaint - will re-image neck  # Bipolar disorder Complicates care - cont home hydroxyzine , effexor   # COPD # Chronic hypoxic respiratory failure.  On 2 liters at home, here on 4 2/2 pulm edema - cont [place] o2 - dialysis today  # ESRD With recent partial session, appears volume overloaded on cxr and with increased o2 requirement - undergoing dialysis right now  # A-fib Rate controlled, meds held 2/2 hypotension - cont home apixaban   # Hypothyroid - cont home levothyroxine   # Chronic hypotension - cont home midodrine   # End-of-life care Multiorgan failure, poor prognosis. Palliative consulted last week, patient made clear she remains full code and full scope, doesn't appear her opinions have changed so little benefit re-consulting palliative  # Calciphylaxis With chronic wounds of LEs, has been seen by vascular who advises no additional w/u, limited interventional options per Dr. Marea on 1/12   DVT  prophylaxis: apixaban  Code Status: full Family Communication: brother at bedside 1/18  Level of care: Telemetry Status is: Observation    Consultants:  Nephrology cardiology  Procedures: hemodialysis  Antimicrobials:  None thus far    Subjective: Reports significant posterior neck pain  Objective: Vitals:    07/02/24 1054 07/02/24 1100 07/02/24 1115 07/02/24 1130  BP: (!) 124/100 120/75 112/86 109/84  Pulse:   70   Resp:   20   Temp:      TempSrc:      SpO2: 91% 90% 93% 91%  Weight:      Height:        Intake/Output Summary (Last 24 hours) at 07/02/2024 1146 Last data filed at 07/02/2024 9085 Gross per 24 hour  Intake 240 ml  Output --  Net 240 ml   Filed Weights   07/01/24 1132 07/02/24 1038  Weight: 70.5 kg 69 kg    Examination:  General exam: Appears in distress Respiratory system: basilar rales Cardiovascular system: S1 & S2 heard, systolic murmur Gastrointestinal system: Abdomen is obese, mildly distended, soft and nontender. Central nervous system: Alert, moving all 4 Extremities: warm, mod LE edema,  Skin:   Psychiatry: slightly agitated     Data Reviewed: I have personally reviewed following labs and imaging studies  CBC: Recent Labs  Lab 06/26/24 0358 06/27/24 0523 06/28/24 0226 07/01/24 1336 07/02/24 0526  WBC 5.0 5.7 6.2 12.0* 12.2*  NEUTROABS  --   --   --  10.4*  --   HGB 11.6* 11.5* 11.3* 12.7 12.3  HCT 37.4 35.5* 35.1* 38.7 37.0  MCV 100.3* 99.4 98.9 97.0 95.9  PLT 110* 103* 101* 82* 68*   Basic Metabolic Panel: Recent Labs  Lab 06/26/24 0358 06/27/24 0523 06/27/24 1431 06/28/24 0226 07/01/24 1336 07/01/24 2155 07/02/24 0526  NA 141   < > 140 141 138 135 135  K 4.0   < > 4.2 4.2 4.0 4.3 4.3  CL 98   < > 94* 96* 92* 93* 92*  CO2 24   < > 24 22 21* 19* 23  GLUCOSE 89   < > 100* 102* 70 90 106*  BUN 40*   < > 26* 32* 28* 32* 35*  CREATININE 7.21*   < > 5.25* 5.87* 5.07* 5.43* 5.79*  CALCIUM  10.1   < > 10.6* 10.5* 10.4* 10.2 10.5*  MG 2.1  --   --   --   --  2.1  --   PHOS 6.5*  --   --   --   --   --   --    < > = values in this interval not displayed.   GFR: Estimated Creatinine Clearance: 9.6 mL/min (A) (by C-G formula based on SCr of 5.79 mg/dL (H)). Liver Function Tests: Recent Labs  Lab 06/26/24 0358 07/01/24 1336  07/01/24 2155 07/02/24 0526  AST  --  36 36 37  ALT  --  15 15 14   ALKPHOS  --  71 68 72  BILITOT  --  1.2 1.2 1.3*  PROT  --  7.2 6.8 6.9  ALBUMIN  4.0 3.8 3.5 3.5   No results for input(s): LIPASE, AMYLASE in the last 168 hours. Recent Labs  Lab 07/01/24 1726  AMMONIA 15   Coagulation Profile: No results for input(s): INR, PROTIME in the last 168 hours. Cardiac Enzymes: No results for input(s): CKTOTAL, CKMB, CKMBINDEX, TROPONINI in the last 168 hours. BNP (last 3 results) Recent Labs    06/23/24  0700 06/25/24 0354  PROBNP >35,000.0* >35,000.0*   HbA1C: No results for input(s): HGBA1C in the last 72 hours. CBG: Recent Labs  Lab 07/01/24 1602 07/01/24 1625 07/01/24 1739 07/01/24 1834 07/01/24 2029  GLUCAP 59* 127* 112* 79 83   Lipid Profile: No results for input(s): CHOL, HDL, LDLCALC, TRIG, CHOLHDL, LDLDIRECT in the last 72 hours. Thyroid  Function Tests: No results for input(s): TSH, T4TOTAL, FREET4, T3FREE, THYROIDAB in the last 72 hours. Anemia Panel: No results for input(s): VITAMINB12, FOLATE, FERRITIN, TIBC, IRON, RETICCTPCT in the last 72 hours. Urine analysis:    Component Value Date/Time   COLORURINE Yellow 09/14/2012 0414   APPEARANCEUR Cloudy 09/14/2012 0414   LABSPEC 1.018 09/14/2012 0414   PHURINE 9.0 09/14/2012 0414   GLUCOSEU 50 mg/dL 95/97/7985 9585   HGBUR 1+ 09/14/2012 0414   BILIRUBINUR Negative 09/14/2012 0414   KETONESUR Negative 09/14/2012 0414   PROTEINUR >=500 09/14/2012 0414   NITRITE Negative 09/14/2012 0414   LEUKOCYTESUR 2+ 09/14/2012 0414   Sepsis Labs: @LABRCNTIP (procalcitonin:4,lacticidven:4)  ) Recent Results (from the past 240 hours)  Resp panel by RT-PCR (RSV, Flu A&B, Covid) Anterior Nasal Swab     Status: None   Collection Time: 06/22/24  9:28 PM   Specimen: Anterior Nasal Swab  Result Value Ref Range Status   SARS Coronavirus 2 by RT PCR NEGATIVE NEGATIVE Final     Comment: (NOTE) SARS-CoV-2 target nucleic acids are NOT DETECTED.  The SARS-CoV-2 RNA is generally detectable in upper respiratory specimens during the acute phase of infection. The lowest concentration of SARS-CoV-2 viral copies this assay can detect is 138 copies/mL. A negative result does not preclude SARS-Cov-2 infection and should not be used as the sole basis for treatment or other patient management decisions. A negative result may occur with  improper specimen collection/handling, submission of specimen other than nasopharyngeal swab, presence of viral mutation(s) within the areas targeted by this assay, and inadequate number of viral copies(<138 copies/mL). A negative result must be combined with clinical observations, patient history, and epidemiological information. The expected result is Negative.  Fact Sheet for Patients:  bloggercourse.com  Fact Sheet for Healthcare Providers:  seriousbroker.it  This test is no t yet approved or cleared by the [place]  FDA and  has been authorized for detection and/or diagnosis of SARS-CoV-2 by FDA under an Emergency Use Authorization (EUA). This EUA will remain  in effect (meaning this test can be used) for the duration of the COVID-19 declaration under Section 564(b)(1) of the Act, 21 U.S.C.section 360bbb-3(b)(1), unless the authorization is terminated  or revoked sooner.       Influenza A by PCR NEGATIVE NEGATIVE Final   Influenza B by PCR NEGATIVE NEGATIVE Final    Comment: (NOTE) The Xpert Xpress SARS-CoV-2/FLU/RSV plus assay is intended as an aid in the diagnosis of influenza from Nasopharyngeal swab specimens and should not be used as a sole basis for treatment. Nasal washings and aspirates are unacceptable for Xpert Xpress SARS-CoV-2/FLU/RSV testing.  Fact Sheet for Patients: bloggercourse.com  Fact Sheet for Healthcare  Providers: seriousbroker.it  This test is not yet approved or cleared by the [place]  FDA and has been authorized for detection and/or diagnosis of SARS-CoV-2 by FDA under an Emergency Use Authorization (EUA). This EUA will remain in effect (meaning this test can be used) for the duration of the COVID-19 declaration under Section 564(b)(1) of the Act, 21 U.S.C. section 360bbb-3(b)(1), unless the authorization is terminated or revoked.     Resp Syncytial Virus  by PCR NEGATIVE NEGATIVE Final    Comment: (NOTE) Fact Sheet for Patients: bloggercourse.com  Fact Sheet for Healthcare Providers: seriousbroker.it  This test is not yet approved or cleared by the [place]  FDA and has been authorized for detection and/or diagnosis of SARS-CoV-2 by FDA under an Emergency Use Authorization (EUA). This EUA will remain in effect (meaning this test can be used) for the duration of the COVID-19 declaration under Section 564(b)(1) of the Act, 21 U.S.C. section 360bbb-3(b)(1), unless the authorization is terminated or revoked.  Performed at [facility], [address] [place], [place] [address]   MRSA Next Gen by PCR, Nasal     Status: Abnormal   Collection Time: 06/22/24 11:51 PM   Specimen: Nasal Mucosa; Nasal Swab  Result Value Ref Range Status   MRSA by PCR Next Gen DETECTED (A) NOT DETECTED Final    Comment: RESULT CALLED TO, READ BACK BY AND VERIFIED WITH: ABIGAIL FOWLKE @0215  ON 06/23/24 SKL (NOTE) The GeneXpert MRSA Assay (FDA approved for NASAL specimens only), is one component of a comprehensive MRSA colonization surveillance program. It is not intended to diagnose MRSA infection nor to guide or monitor treatment for MRSA infections. Test performance is not FDA approved in patients less than 58 years old. Performed at [facility], [address] [place], [place] [address]           Radiology Studies: CT Cervical Spine Wo Contrast Result Date: 07/01/2024 EXAM: CT CERVICAL SPINE WITHOUT CONTRAST 07/01/2024 03:13:30 PM TECHNIQUE: CT of the cervical spine was performed without the administration of intravenous contrast. Multiplanar reformatted images are provided for review. Automated exposure control, iterative reconstruction, and/or weight based adjustment of the mA/kV was utilized to reduce the radiation dose to as low as reasonably achievable. COMPARISON: 06/22/2024 and 02/16/2022 CLINICAL HISTORY: Ataxia, cervical trauma FINDINGS: BONES AND ALIGNMENT: No acute fracture or traumatic malalignment. Focal kyphosis at C5-C6 is stable. Straightening of the normal cervical lordosis otherwise is stable. DEGENERATIVE CHANGES: Chronic endplate changes at C5-C6. SOFT TISSUES: Adjacent soft tissues are unremarkable. Calcifications are again seen within the thyroid  bilaterally. Atherosclerotic calcifications are present in the proximal great vessels and at the carotid bifurcations without definite stenosis. A right IJ catheter is in place. No prevertebral soft tissue swelling. IMPRESSION: 1. No acute cervical spine findings. 2. Stable chronic endplate changes and focal kyphosis at C5-6. Electronically signed by: Lonni Necessary MD 07/01/2024 03:57 PM EST RP Workstation: [id number]   CT HEAD WO CONTRAST ( ) Result Date: 07/01/2024 EXAM: CT HEAD WITHOUT CONTRAST 07/01/2024 03:13:30 PM TECHNIQUE: CT of the head was performed without the administration of intravenous contrast. Automated exposure control, iterative reconstruction, and/or weight based adjustment of the mA/kV was utilized to reduce the radiation dose to as low as reasonably achievable. COMPARISON: 06/22/2024 CLINICAL HISTORY: Head trauma, abnormal mental status (Age 42-64y). Fall today. FINDINGS: BRAIN AND VENTRICLES: No acute hemorrhage. No evidence of acute infarct. Chronic encephalomalacia within bilateral frontal,  parietal, and right occipital lobes compatible with remote infarcts. Extensive periventricular and subcortical white matter low-density changes compatible with chronic microvascular ischemic change. No hydrocephalus. No extra-axial collection. No mass effect or midline shift. ORBITS: No acute abnormality. SINUSES: No acute abnormality. SOFT TISSUES AND SKULL: No acute soft tissue abnormality. No skull fracture. Atherosclerotic calcifications in large vessels of skull base. IMPRESSION: 1. No acute intracranial abnormality. 2. Chronic encephalomalacia within bilateral frontal, parietal, and right occipital lobes compatible with remote infarcts. 3. Extensive periventricular and subcortical white matter low-density changes  compatible with chronic microvascular ischemic change. Electronically signed by: Lonni Necessary MD 07/01/2024 03:54 PM EST RP Workstation: [id number]   DG Wrist Complete Right Result Date: 07/01/2024 EXAM: 3 or more VIEW(S) XRAY OF THE RIGHT WRIST 07/01/2024 12:22:33 PM COMPARISON: None available. CLINICAL HISTORY: fall FINDINGS: BONES AND JOINTS: No acute fracture. No malalignment. Subchondral cysts in the base of the first metacarpal bone with degenerative changes of the first [facility] joint. SOFT TISSUES: Unremarkable. IMPRESSION: 1. No acute fracture or dislocation. Electronically signed by: Waddell Calk MD 07/01/2024 12:51 PM EST RP Workstation: [id number]   DG Pelvis Portable Result Date: 07/01/2024 EXAM: 1 or 2 VIEW(S) XRAY OF THE PELVIS 07/01/2024 12:22:33 PM COMPARISON: 01/13/2016 CLINICAL HISTORY: fall FINDINGS: BONES AND JOINTS: No acute fracture. No malalignment. SOFT TISSUES: Likely calcified uterine fibroids. Severe atherosclerotic plaque. IMPRESSION: 1. No evidence of acute traumatic injury. Electronically signed by: Waddell Calk MD 07/01/2024 12:49 PM EST RP Workstation: [id number]   DG Chest Portable 1 View Result Date: 07/01/2024 EXAM: 1 VIEW(S) XRAY OF THE CHEST  07/01/2024 11:45:00 AM COMPARISON: 06/25/2024 CLINICAL HISTORY: SOB FINDINGS: LINES, TUBES AND DEVICES: Right chest wall dialysis catheter in place with tip overlying right atrium. Left brachial stent noted. LUNGS AND PLEURA: Pulmonary interstitial prominence with mild pulmonary edema. Decreased left pleural effusion. No pneumothorax. HEART AND MEDIASTINUM: Cardiomegaly. Coronary artery calcifications and stents. Surgical changes in mediastinum. Aortic calcification. BONES AND SOFT TISSUES: Intact sternotomy wires. IMPRESSION: 1. Pulmonary interstitial prominence with mild pulmonary edema, similar . 2. Decreased left pleural effusion. 3. Cardiomegaly. Electronically signed by: Waddell Calk MD 07/01/2024 11:55 AM EST RP Workstation: [id number]        Scheduled Meds:  apixaban   5 mg Oral BID   Chlorhexidine  Gluconate Cloth  6 each Topical Q0600   collagenase   1 Application Topical Daily   levothyroxine   75 mcg Oral Q0600   midodrine   10 mg Oral TID WC   pantoprazole   40 mg Oral BH-q7a   venlafaxine   37.5 mg Oral BID WC   Continuous Infusions:   LOS: 0 days     Devaughn KATHEE Ban, MD [facility]   If 7PM-7AM, please contact night-coverage [email or web address] Password [email or web address] 07/02/2024, 11:46 AM     "

## 2024-07-02 NOTE — Progress Notes (Signed)
 1552 Fentanyl  100mcg override by this nurse in pyxis during code verbal orders given by Ronal Lewandowsky MD. Given at 1555 by [facility] ED charge nurse.

## 2024-07-02 NOTE — ED Provider Notes (Signed)
 [facility] Department of Emergency Medicine   Code Blue CONSULT NOTE  Chief Complaint: Cardiac arrest/unresponsive   Level V Caveat: Unresponsive  History of present illness: I was contacted by the hospital for a CODE BLUE cardiac arrest upstairs and presented to the patient's bedside.    ROS: Unable to obtain, Level V caveat  Scheduled Meds:  apixaban   5 mg Oral BID   Chlorhexidine  Gluconate Cloth  6 each Topical Q0600   collagenase   1 Application Topical Daily   fentaNYL        hydrOXYzine   50 mg Oral BID   levothyroxine   75 mcg Oral Q0600   midodrine   10 mg Oral TID WC   pantoprazole   40 mg Oral BH-q7a   venlafaxine   37.5 mg Oral BID WC   Continuous Infusions:  amiodarone  60 mg/hr (07/02/24 1613)   amiodarone      propofol  (DIPRIVAN ) infusion 20 mcg/kg/min (07/02/24 1608)   PRN Meds:.acetaminophen  **OR** acetaminophen , fentaNYL , fentaNYL  (SUBLIMAZE ) injection, ondansetron  **OR** ondansetron  (ZOFRAN ) IV, oxyCODONE , polyethylene glycol Past Medical History:  Diagnosis Date   Acute lacunar infarction (HCC) 03/03/2014   a.) MRI brain 03/03/2014: two acute punctate lacunar infarcts anteriorly in the BILATERAL centrum semiovale   Adult behavior problems    Anemia of chronic renal failure    Aortic atherosclerosis    Arthritis    Atrial fibrillation and flutter (HCC)    a.) CHA2DS2VASc = 6 (sex, HFrEF, HTN, CVA x 2, prior MI/vascular disease) as of 02/04/2024; b.) rate/rhythm maintained on oral metoprolol  succinate; chronically anticoagulated with apixaban    Bipolar disorder (HCC)    Cardiomegaly    Cerebral microvascular disease    CHF (congestive heart failure) (HCC)    Chicken pox    Cholelithiasis    Chronic respiratory failure with hypoxia (HCC) 12/19/2021   COPD (chronic obstructive pulmonary disease) (HCC)    Coronary artery disease    DDD (degenerative disc disease), cervical    DDD (degenerative disc disease), thoracolumbar     Diverticulosis    Dyspnea    ESRD (end stage renal disease) on dialysis (M,W,F)    GERD (gastroesophageal reflux disease)    Gout    HCV (hepatitis C virus)    Headache    HFrEF (heart failure with reduced ejection fraction) (HCC)    History of delirium 04/12/2014   History of substance abuse (HCC)    a.) tobacco + cocaine   Hyperkalemia 11/13/2014   Hyperlipidemia    Hypertension    Hypotension 02/24/2014   Hypothyroidism    Ischemic cardiomyopathy    Nose colonized with MRSA 02/01/2024   a.) presurgical PCR (+) on 02/01/2024 prior to A/V FISTULAGRAM; b.) presurgical PCR (+) on 03/07/2024 prior to AV FISTULA CREATION   NSTEMI (non-ST elevated myocardial infarction) (HCC) 10/08/2013   NSTEMI (non-ST elevated myocardial infarction) (HCC) 01/23/2002   a.) LHC 01/24/2002 --> multi-vessel CAD with IRA being 90% OM2 --> delayed PCI until 01/25/2002 --> 3.0 x 23 mm BX Velocity stent   Obesity    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    On apixaban  therapy    Peripheral vascular disease    Pneumonia    Postoperative anemia due to acute blood loss 02/27/2014   Postoperative cerebrovascular infarction following cardiac surgery 02/21/2014   a.) developed RIGHT upper/lower extremity weakness following cardiac revascularization --> neuro consult and head CT --> Hypodensity noted in the area of the LEFT motor cortex consistent with a component of acute ischemia   Pulmonary  hypertension (HCC)    RBBB (right bundle branch block)    Renal osteodystrophy    S/P CABG x 3 02/20/2014   a.) LIMA-LAD, SVG-OM1, SVG-PDA   Stroke (HCC) 05/23/2010   a.) brain MRI 05/23/2010: BILATERAL acute infarcts and old prior infarcts; RIGHT frontal lobe extending into the white matter. Small focus of restricted diffusion in the cortex of the medial LEFT frontoparietal lobe. Periventricular T2 and multiple foci of T2 hyperintensity in the subcortical white matter and increased FLAIR signal in the cortex of  the LEFT frontal lobe.   Syncope and collapse 07/25/2020   Past Surgical History:  Procedure Laterality Date   A/V FISTULAGRAM Left 03/30/2019   Procedure: A/V FISTULAGRAM;  Surgeon: Marea Selinda RAMAN, MD;  Location: [facility];  Service: Cardiovascular;  Laterality: Left;   A/V FISTULAGRAM Left 10/06/2019   Procedure: A/V FISTULAGRAM;  Surgeon: Marea Selinda RAMAN, MD;  Location: [facility];  Service: Cardiovascular;  Laterality: Left;   A/V FISTULAGRAM Left 05/19/2021   Procedure: A/V FISTULAGRAM;  Surgeon: Marea Selinda RAMAN, MD;  Location: [facility];  Service: Cardiovascular;  Laterality: Left;   A/V FISTULAGRAM Left 01/29/2022   Procedure: A/V Fistulagram;  Surgeon: Marea Selinda RAMAN, MD;  Location: [facility];  Service: Cardiovascular;  Laterality: Left;   A/V FISTULAGRAM Left 04/30/2022   Procedure: A/V Fistulagram;  Surgeon: Marea Selinda RAMAN, MD;  Location: [facility];  Service: Cardiovascular;  Laterality: Left;   A/V FISTULAGRAM Left 08/20/2022   Procedure: A/V Fistulagram;  Surgeon: Marea Selinda RAMAN, MD;  Location: [facility];  Service: Cardiovascular;  Laterality: Left;   A/V FISTULAGRAM N/A 12/03/2022   Procedure: A/V Fistulagram;  Surgeon: Marea Selinda RAMAN, MD;  Location: [facility];  Service: Cardiovascular;  Laterality: N/A;   A/V FISTULAGRAM Left 02/11/2023   Procedure: A/V Fistulagram;  Surgeon: Marea Selinda RAMAN, MD;  Location: [facility];  Service: Cardiovascular;  Laterality: Left;   A/V FISTULAGRAM Left 02/07/2024   Procedure: A/V Fistulagram;  Surgeon: Marea Selinda RAMAN, MD;  Location: [facility];  Service: Cardiovascular;  Laterality: Left;   COLONOSCOPY     COLONOSCOPY WITH PROPOFOL  N/A 04/22/2021   Procedure: COLONOSCOPY WITH PROPOFOL ;  Surgeon: Maryruth Ole DASEN, MD;  Location: [facility];  Service: Endoscopy;  Laterality: N/A;   CORONARY ANGIOPLASTY WITH STENT PLACEMENT Left 01/25/2002   Procedure: CORONARY  ANGIOPLASTY WITH STENT PLACEMENT; Location: [facility]; Surgeon: Margie Lovelace, MD   CORONARY ARTERY BYPASS GRAFT N/A 02/20/2014   Procedure: CORONARY ARTERY BYPASS GRAFT; Location: [facility]; Surgeon: Reyes Fruits, MD   DIALYSIS FISTULA CREATION     DIALYSIS/PERMA CATHETER INSERTION N/A 04/08/2023   Procedure: DIALYSIS/PERMA CATHETER INSERTION;  Surgeon: Marea Selinda RAMAN, MD;  Location: [facility];  Service: Cardiovascular;  Laterality: N/A;   DIALYSIS/PERMA CATHETER REMOVAL N/A 07/06/2023   Procedure: DIALYSIS/PERMA CATHETER REMOVAL;  Surgeon: [name] Cordella MATSU, MD;  Location: [facility];  Service: Cardiovascular;  Laterality: N/A;   ESOPHAGOGASTRODUODENOSCOPY     ESOPHAGOGASTRODUODENOSCOPY N/A 04/22/2021   Procedure: ESOPHAGOGASTRODUODENOSCOPY (EGD);  Surgeon: Maryruth Ole DASEN, MD;  Location: [facility];  Service: Endoscopy;  Laterality: N/A;   FLEXIBLE SIGMOIDOSCOPY N/A 02/23/2019   Procedure: FLEXIBLE SIGMOIDOSCOPY;  Surgeon: Gaylyn Gladis PENNER, MD;  Location: [facility];  Service: Endoscopy;  Laterality: N/A;   IABP INSERTION  02/20/2014   Procedure: INTRA-AORTIC BALLOON PUMP INSERTION; Location: [facility]; Surgeon: Reyes Fruits, MD   INSERTION OF  ARTERIOVENOUS (AV) ARTEGRAFT ARM Right 03/09/2024   Procedure: INSERTION, GRAFT, ARTERIOVENOUS, UPPER EXTREMITY;  Surgeon: Marea Selinda RAMAN, MD;  Location: [facility];  Service: Vascular;  Laterality: Right;  BRACHIAL AXILLARY   INSERTION OF DIALYSIS CATHETER N/A 04/20/2024   Procedure: INSERTION OF DIALYSIS CATHETER;  Surgeon: Marea Selinda RAMAN, MD;  Location: [facility];  Service: Vascular;  Laterality: N/A;   LEFT HEART CATH AND CORONARY ANGIOGRAPHY Left 01/24/2002   Procedure: LEFT HEART CATH AND CORONARY ANGIOGRAPHY; Location: [facility]; Surgeon: Margie Lovelace, MD   LEFT HEART CATH AND CORONARY ANGIOGRAPHY Left 05/25/2012   Procedure: LEFT HEART CATH AND CORONARY ANGIOGRAPHY; Location: [facility]; Surgeon: Margie Lovelace, MD   LEFT HEART CATH AND  CORONARY ANGIOGRAPHY Left 10/09/2013   Procedure: LEFT HEART CATH AND CORONARY ANGIOGRAPHY; Location: [facility]; Surgeon: Vinie Jude, MD   LEFT HEART CATH AND CORS/GRAFTS ANGIOGRAPHY N/A 08/08/2018   Procedure: LEFT HEART CATH AND CORS/GRAFTS ANGIOGRAPHY;  Surgeon: Fernand Denyse LABOR, MD;  Location: [facility];  Service: Cardiovascular;  Laterality: N/A;   LEFT HEART CATH AND CORS/GRAFTS ANGIOGRAPHY N/A 01/30/2019   Procedure: LEFT HEART CATH AND CORS/GRAFTS ANGIOGRAPHY;  Surgeon: Fernand Denyse LABOR, MD;  Location: [facility];  Service: Cardiovascular;  Laterality: N/A;   LIGATION ARTERIOVENOUS GORTEX GRAFT Right 04/20/2024   Procedure: LIGATION ARTERIOVENOUS GORTEX GRAFT;  Surgeon: Marea Selinda RAMAN, MD;  Location: [facility];  Service: Vascular;  Laterality: Right;   LOWER EXTREMITY ANGIOGRAPHY Left 04/24/2024   Procedure: Lower Extremity Angiography;  Surgeon: Marea Selinda RAMAN, MD;  Location: [facility];  Service: Cardiovascular;  Laterality: Left;   ultrasound guided pericardiocentesis     UPPER EXTREMITY ANGIOGRAPHY Right 03/30/2024   Procedure: Upper Extremity Angiography;  Surgeon: Marea Selinda RAMAN, MD;  Location: [facility];  Service: Cardiovascular;  Laterality: Right;   Social History   Socioeconomic History   Marital status: Single    Spouse name: Not on file   Number of children: Not on file   Years of education: Not on file   Highest education level: Not on file  Occupational History   Not on file  Tobacco Use   Smoking status: Every Day    Current packs/day: 0.25    Average packs/day: 0.3 packs/day for 20.0 years (5.0 ttl pk-yrs)    Types: Cigarettes   Smokeless tobacco: Never   Tobacco comments:    smokes 1 - 1.5 cigarettes a day  Vaping Use   Vaping status: Never Used  Substance and Sexual Activity   Alcohol use: No    Comment: social   Drug use: No   Sexual activity: Not Currently    Birth control/protection: Post-menopausal  Other Topics  Concern   Not on file  Social History Narrative   Lives alone   Social Drivers of Health   Tobacco Use: High Risk (07/01/2024)   Patient History    Smoking Tobacco Use: Every Day    Smokeless Tobacco Use: Never    Passive Exposure: Not on file  Financial Resource Strain: Low Risk  (05/31/2024)   Received from [facility]   Overall Financial Resource Strain (CARDIA)    Difficulty of Paying Living Expenses: Not hard at all  Food Insecurity: No Food Insecurity (07/02/2024)   Epic    Worried About Radiation Protection Practitioner of Food in the Last Year: Never true    Ran Out of Food in the Last Year: Never true  Transportation Needs: No Transportation Needs (07/02/2024)  Epic    Lack of Transportation (Medical): No    Lack of Transportation (Non-Medical): No  Physical Activity: Not on file  Stress: Not on file  Social Connections: Not on file  Intimate Partner Violence: Not At Risk (07/02/2024)   Epic    Fear of Current or Ex-Partner: No    Emotionally Abused: No    Physically Abused: No    Sexually Abused: No  Depression (PHQ2-9): Low Risk (07/06/2023)   Depression (PHQ2-9)    PHQ-2 Score: 3  Alcohol Screen: Not on file  Housing: Low Risk (07/02/2024)   Epic    Unable to Pay for Housing in the Last Year: No    Number of Times Moved in the Last Year: 0    Homeless in the Last Year: No  Utilities: Not At Risk (07/02/2024)   Epic    Threatened with loss of utilities: No  Health Literacy: Not on file   Allergies[1]  Last set of Vital Signs (not current) Vitals:   07/02/24 1608 07/02/24 1612  BP: (!) 101/90   Pulse:    Resp: (!) 23   Temp: 98.2 F (36.8 C)   SpO2:  94%      Physical Exam  Gen: unresponsive Cardiovascular: pulseless  Resp: apneic. Breath sounds equal bilaterally with bagging  Abd: nondistended  Neuro: GCS 3, unresponsive to pain  HEENT: No blood in posterior pharynx,  Neck: No crepitus  Musculoskeletal: No deformity  Skin: warm  Procedures  (when applicable, including Critical Care time): .Critical Care  Performed by: Ernest Ronal BRAVO, MD Authorized by: Ernest Ronal BRAVO, MD   Critical care provider statement:    Critical care time (minutes):  30   Critical care was necessary to treat or prevent imminent or life-threatening deterioration of the following conditions:  Cardiac failure   Critical care was time spent personally by me on the following activities:  Development of treatment plan with patient or surrogate, discussions with consultants, evaluation of patient's response to treatment, examination of patient, ordering and review of laboratory studies, ordering and review of radiographic studies, ordering and performing treatments and interventions, pulse oximetry, re-evaluation of patient's condition and review of old charts Procedure Name: Intubation Date/Time: 07/02/2024 4:40 PM  Performed by: Ernest Ronal BRAVO, MDPre-anesthesia Checklist: Patient identified Oxygen  Delivery Method: Ambu bag Laryngoscope Size: Glidescope and 4 Grade View: Grade I Tube size: 7.5 mm Number of attempts: 1 Difficulty Due To: Difficulty was unanticipated       MDM / Assessment and Plan Patient was a V-fib arrest shocked.  I also did give patient calcium , bicarb given concerns for her being a dialysis patient and hyperkalemia.  However the report was that patient did have dialysis earlier today.  Patient did get pulses back rate when I was intubating.  She was spontaneously breathing but there were no RSI medications.  Did not want to delay intubating patient due to risk for recurrent arrest versus aspiration versus hypoxia therefore did proceed with intubation without any issues.  Patient did have an episode of wide-complex tachycardia with pulse therefore patient had a synchronized cardioversion that was done.  Patient was given 300 IV amiodarone .  Patient's sugar was checked and was reassuring.  Patient was given a push of fentanyl  given she was biting  on the tube.    Patient's blood pressure was hypertensive and she remained in a tachycardic rhythm post synchronized cardioversion.  Given ICU bed was ready patient was transferred there so she she could have  closer monitoring.    [1]  Allergies Allergen Reactions   Morphine  And Codeine Hives   Levaquin [Levofloxacin] Itching and Other (See Comments)    Severe itching; prickly sensation; severe left leg pain; immobility.   Enalapril  Other (See Comments)    hallucinations   Latex Rash   Tape Rash and Other (See Comments)     Ernest Ronal BRAVO, MD 07/02/24 [phone]

## 2024-07-03 DIAGNOSIS — M4012 Other secondary kyphosis, cervical region: Secondary | ICD-10-CM

## 2024-07-03 DIAGNOSIS — M47812 Spondylosis without myelopathy or radiculopathy, cervical region: Secondary | ICD-10-CM

## 2024-07-03 LAB — BLOOD CULTURE ID PANEL (REFLEXED) - BCID2

## 2024-07-03 LAB — BASIC METABOLIC PANEL WITH GFR
Anion gap: 22 — ABNORMAL HIGH (ref 5–15)
BUN: 26 mg/dL — ABNORMAL HIGH (ref 8–23)
CO2: 23 mmol/L (ref 22–32)
Calcium: 10.2 mg/dL (ref 8.9–10.3)
Chloride: 90 mmol/L — ABNORMAL LOW (ref 98–111)
Creatinine, Ser: 4.09 mg/dL — ABNORMAL HIGH (ref 0.44–1.00)
GFR, Estimated: 12 mL/min — ABNORMAL LOW
Glucose, Bld: 169 mg/dL — ABNORMAL HIGH (ref 70–99)
Potassium: 4.5 mmol/L (ref 3.5–5.1)
Sodium: 134 mmol/L — ABNORMAL LOW (ref 135–145)

## 2024-07-03 LAB — HEPARIN LEVEL (UNFRACTIONATED): Heparin Unfractionated: >1.1 [IU]/mL — ABNORMAL HIGH (ref 0.30–0.70)

## 2024-07-03 LAB — CBC
HCT: 37.9 % (ref 36.0–46.0)
Hemoglobin: 12.4 g/dL (ref 12.0–15.0)
MCH: 31.5 pg (ref 26.0–34.0)
MCHC: 32.7 g/dL (ref 30.0–36.0)
MCV: 96.2 fL (ref 80.0–100.0)
Platelets: 63 K/uL — ABNORMAL LOW (ref 150–400)
RBC: 3.94 MIL/uL (ref 3.87–5.11)
RDW: 18 % — ABNORMAL HIGH (ref 11.5–15.5)
WBC: 14.7 K/uL — ABNORMAL HIGH (ref 4.0–10.5)
nRBC: 0 % (ref 0.0–0.2)

## 2024-07-03 LAB — GLUCOSE, CAPILLARY
Glucose-Capillary: 153 mg/dL — ABNORMAL HIGH (ref 70–99)
Glucose-Capillary: 141 mg/dL — ABNORMAL HIGH (ref 70–99)
Glucose-Capillary: 86 mg/dL (ref 70–99)

## 2024-07-03 LAB — MAGNESIUM: Magnesium: 1.8 mg/dL (ref 1.7–2.4)

## 2024-07-03 LAB — APTT
aPTT: 102 s — ABNORMAL HIGH (ref 24–36)
aPTT: 69 s — ABNORMAL HIGH (ref 24–36)

## 2024-07-03 LAB — TRIGLYCERIDES: Triglycerides: 293 mg/dL — ABNORMAL HIGH

## 2024-07-03 LAB — POTASSIUM: Potassium: 3.8 mmol/L (ref 3.5–5.1)

## 2024-07-03 MED ORDER — JUVEN PO PACK
1.0000 | PACK | Freq: Two times a day (BID) | ORAL | Status: DC
  Administered 2024-07-04 – 2024-07-17 (×23): 1

## 2024-07-03 MED ORDER — PROSOURCE TF20 ENFIT COMPATIBL EN LIQD
60.0000 mL | Freq: Every day | ENTERAL | Status: DC
  Administered 2024-07-04 – 2024-07-10 (×7): 60 mL
  Filled 2024-07-03: qty 60

## 2024-07-03 MED ORDER — VITAL AF 1.2 CAL PO LIQD
1000.0000 mL | ORAL | Status: DC
  Administered 2024-07-03 – 2024-07-05 (×2): 1000 mL

## 2024-07-03 MED ORDER — SODIUM CHLORIDE 0.9 % IV SOLN
2.0000 g | INTRAVENOUS | Status: DC
  Administered 2024-07-03 – 2024-07-09 (×7): 2 g via INTRAVENOUS
  Filled 2024-07-03 (×8): qty 20

## 2024-07-03 MED ORDER — RENA-VITE PO TABS
1.0000 | ORAL_TABLET | Freq: Every day | ORAL | Status: DC
  Administered 2024-07-03 – 2024-07-16 (×14): 1
  Filled 2024-07-03 (×14): qty 1

## 2024-07-03 MED ORDER — DEXTROSE 5 % IV SOLN
30.0000 mg/h | INTRAVENOUS | Status: DC
  Administered 2024-07-03 – 2024-07-05 (×2): 30 mg/h via INTRAVENOUS
  Filled 2024-07-03 (×2): qty 9
  Filled 2024-07-03: qty 450

## 2024-07-03 MED ORDER — FREE WATER
30.0000 mL | Status: AC
  Administered 2024-07-03 – 2024-07-21 (×100): 30 mL

## 2024-07-03 NOTE — Progress Notes (Signed)
 PHARMACY - ANTICOAGULATION CONSULT NOTE  Pharmacy Consult for UFH infusion Indication: atrial fibrillation  Allergies[1]  Patient Measurements: Height: 5' 6 (167.6 cm) Weight: 67 kg (147 lb 11.3 oz) IBW/kg (Calculated) : 59.3 HEPARIN  DW (KG): 70.5  Vital Signs: Temp: 98.7 F (37.1 C) (01/19 1315) Temp Source: Axillary (01/19 1315) BP: 126/71 (01/19 1615) Pulse Rate: 83 (01/19 1615)  Labs: Recent Labs    07/02/24 0526 07/02/24 1646 07/03/24 0543 07/03/24 1525  HGB 12.3 12.2 12.4  --   HCT 37.0 37.3 37.9  --   PLT 68* 70* 63*  --   APTT  --   --  102* 69*  HEPARINUNFRC  --   --  >1.10*  --   CREATININE 5.79* 3.57* 4.09*  --     Estimated Creatinine Clearance: 13.5 mL/min (A) (by C-G formula based on SCr of 4.09 mg/dL (H)).   Medical History: Past Medical History:  Diagnosis Date   Acute lacunar infarction (HCC) 03/03/2014   a.) MRI brain 03/03/2014: two acute punctate lacunar infarcts anteriorly in the BILATERAL centrum semiovale   Adult behavior problems    Anemia of chronic renal failure    Aortic atherosclerosis    Arthritis    Atrial fibrillation and flutter (HCC)    a.) CHA2DS2VASc = 6 (sex, HFrEF, HTN, CVA x 2, prior MI/vascular disease) as of 02/04/2024; b.) rate/rhythm maintained on oral metoprolol  succinate; chronically anticoagulated with apixaban    Bipolar disorder (HCC)    Cardiomegaly    Cerebral microvascular disease    CHF (congestive heart failure) (HCC)    Chicken pox    Cholelithiasis    Chronic respiratory failure with hypoxia (HCC) 12/19/2021   COPD (chronic obstructive pulmonary disease) (HCC)    Coronary artery disease    DDD (degenerative disc disease), cervical    DDD (degenerative disc disease), thoracolumbar    Diverticulosis    Dyspnea    ESRD (end stage renal disease) on dialysis (M,W,F)    GERD (gastroesophageal reflux disease)    Gout    HCV (hepatitis C virus)    Headache    HFrEF (heart failure with reduced ejection  fraction) (HCC)    History of delirium 04/12/2014   History of substance abuse (HCC)    a.) tobacco + cocaine   Hyperkalemia 11/13/2014   Hyperlipidemia    Hypertension    Hypotension 02/24/2014   Hypothyroidism    Ischemic cardiomyopathy    Nose colonized with MRSA 02/01/2024   a.) presurgical PCR (+) on 02/01/2024 prior to A/V FISTULAGRAM; b.) presurgical PCR (+) on 03/07/2024 prior to AV FISTULA CREATION   NSTEMI (non-ST elevated myocardial infarction) (HCC) 10/08/2013   NSTEMI (non-ST elevated myocardial infarction) (HCC) 01/23/2002   a.) LHC 01/24/2002 --> multi-vessel CAD with IRA being 90% OM2 --> delayed PCI until 01/25/2002 --> 3.0 x 23 mm BX Velocity stent   Obesity    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    On apixaban  therapy    Peripheral vascular disease    Pneumonia    Postoperative anemia due to acute blood loss 02/27/2014   Postoperative cerebrovascular infarction following cardiac surgery 02/21/2014   a.) developed RIGHT upper/lower extremity weakness following cardiac revascularization --> neuro consult and head CT --> Hypodensity noted in the area of the LEFT motor cortex consistent with a component of acute ischemia   Pulmonary hypertension (HCC)    RBBB (right bundle branch block)    Renal osteodystrophy    S/P CABG x 3 02/20/2014  a.) LIMA-LAD, SVG-OM1, SVG-PDA   Stroke (HCC) 05/23/2010   a.) brain MRI 05/23/2010: BILATERAL acute infarcts and old prior infarcts; RIGHT frontal lobe extending into the white matter. Small focus of restricted diffusion in the cortex of the medial LEFT frontoparietal lobe. Periventricular T2 and multiple foci of T2 hyperintensity in the subcortical white matter and increased FLAIR signal in the cortex of the LEFT frontal lobe.   Syncope and collapse 07/25/2020    Medications:  Scheduled:   Chlorhexidine  Gluconate Cloth  6 each Topical Q0600   collagenase   1 Application Topical Daily   [START ON 07/04/2024] feeding  supplement (PROSource TF20)  60 mL Per Tube Daily   free water   30 mL Per Tube Q4H   levothyroxine   75 mcg Oral Q0600   midodrine   10 mg Oral TID WC   multivitamin  1 tablet Per Tube QHS   [START ON 07/04/2024] nutrition supplement (JUVEN)  1 packet Per Tube BID BM   mouth rinse  15 mL Mouth Rinse Q2H   pantoprazole   40 mg Oral BH-q7a   sodium chloride  flush  10-40 mL Intracatheter Q12H   venlafaxine   37.5 mg Oral BID WC    Assessment: 64 y/o F with a h/o atrial fibrillation on apixaban . Last dose was today at 0900. Patient coded on 2024-07-30 with subsequent ROSC and is now in the ICU on ventilation and pressor support. Pharmacy consulted for heparin  bridging.   0119 0543 aPTT 102, HL >1.1, therapeutic but at higher end of goal and HL not yet correlating  0119 1525 aPTT 69, therapeutic x 2  Goal of Therapy:  Heparin  level 0.3-0.7 units/ml aPTT 66-102 seconds Monitor platelets by anticoagulation protocol: Yes   Plan:  Continue heparin  infusion at 950 units/hr Check aPTT and anti-Xa levels tomorrow with morning labs Transition to anti-Xa levels when able Daily anti-Xa/CBC  Idolina DELENA Percy, PharmD Clinical Pharmacist 07/03/2024 4:29 PM         [1]  Allergies Allergen Reactions   Morphine  And Codeine Hives   Levaquin [Levofloxacin] Itching and Other (See Comments)    Severe itching; prickly sensation; severe left leg pain; immobility.   Enalapril  Other (See Comments)    hallucinations   Latex Rash   Tape Rash and Other (See Comments)

## 2024-07-03 NOTE — Progress Notes (Signed)
 PT Cancellation Note  Patient Details Name: [name] MRN: [id number] DOB: 09-Sep-1960   Cancelled Treatment:    Reason Eval/Treat Not Completed: Other (comment)  PT Orders completed after pt transferred to ICU following code blue. Will await new PT orders.  [name], PT, DPT 07/03/24, 7:37 AM   [name] 07/03/2024, 7:36 AM

## 2024-07-03 NOTE — Progress Notes (Signed)
 [facility] CLINIC CARDIOLOGY PROGRESS NOTE   Patient ID: [name] MRN: [id number] DOB/AGE: [birth year] 62 y.o.  Admit date: 07/01/2024 Referring Physician Dr. Devaughn Ban  Primary Physician Orlean Alan HERO, FNP  Primary Cardiologist Dr. Denyse Bathe Reason for Consultation syncope  HPI: [name] is a 63 y.o. female with a past medical history of chronic HFrEF, coronary artery disease, atrial fibrillation on eliquis , COPD, ESRD on HD, bipolar disorder, hypotension on midodrine , hx CVA  who presented to the ED on 07/01/2024 for syncopal event at home. Cardiology was consulted for further evaluation.   Interval History:  -Patient seen and examined this AM, intubated and sedated.  -BP and HR overall stable, on vasopressin . No events noted on tele, atrial fibrillation with controlled HR.  Review of systems complete and found to be negative unless listed above   Vitals:   07/03/24 0830 07/03/24 0845 07/03/24 0900 07/03/24 0915  BP: (!) 74/47 (!) 99/49 119/70 (!) 109/58  Pulse: (!) 44 (!) 42 (!) 54 (!) 54  Resp: 16 16 16 17   Temp:      TempSrc:      SpO2: 94% 94% 95% 95%  Weight:      Height:         Intake/Output Summary (Last 24 hours) at 07/03/2024 9082 Last data filed at 07/03/2024 0813 Gross per 24 hour  Intake 1863.24 ml  Output 2000 ml  Net -136.76 ml     PHYSICAL EXAM General: Ill appearing female, intubated/sedated. HEENT: Normocephalic and atraumatic. Neck: No JVD.  Lungs: Mechanical breath sounds Heart: Irregularly irregular, controlled rate. Normal S1 and S2 without gallops or murmurs. Radial & DP pulses 2+ bilaterally. Abdomen: Non-distended appearing.  Msk: Normal strength and tone for age. Extremities: No clubbing, cyanosis or edema.     LABS: Basic Metabolic Panel: Recent Labs    07/01/24 2155 07/02/24 0526 07/02/24 1646 07/03/24 0543  NA 135   < > 136 134*  K 4.3   < > 3.9 4.5  CL 93*   < > 90* 90*  CO2 19*   < > 23 23  GLUCOSE 90    < > 154* 169*  BUN 32*   < > 18 26*  CREATININE 5.43*   < > 3.57* 4.09*  CALCIUM  10.2   < > 10.3 10.2  MG 2.1  --   --   --    < > = values in this interval not displayed.   Liver Function Tests: Recent Labs    07/02/24 0526 07/02/24 1646  AST 37 54*  ALT 14 19  ALKPHOS 72 81  BILITOT 1.3* 1.4*  PROT 6.9 6.6  ALBUMIN  3.5 3.5   No results for input(s): LIPASE, AMYLASE in the last 72 hours. CBC: Recent Labs    07/01/24 1336 07/02/24 0526 07/02/24 1646 07/03/24 0543  WBC 12.0*   < > 14.2* 14.7*  NEUTROABS 10.4*  --   --   --   HGB 12.7   < > 12.2 12.4  HCT 38.7   < > 37.3 37.9  MCV 97.0   < > 96.4 96.2  PLT 82*   < > 70* 63*   < > = values in this interval not displayed.   Cardiac Enzymes: No results for input(s): CKTOTAL, CKMB, CKMBINDEX, TROPONINIHS in the last 72 hours. BNP: No results for input(s): BNP in the last 72 hours. D-Dimer: No results for input(s): DDIMER in the last 72 hours. Hemoglobin A1C: No results for input(s):  HGBA1C in the last 72 hours. Fasting Lipid Panel: Recent Labs    07/03/24 0543  TRIG 293*   Thyroid  Function Tests: No results for input(s): TSH, T4TOTAL, T3FREE, THYROIDAB in the last 72 hours.  Invalid input(s): FREET3 Anemia Panel: No results for input(s): VITAMINB12, FOLATE, FERRITIN, TIBC, IRON, RETICCTPCT in the last 72 hours.  DG Abd 1 View Result Date: 07/02/2024 EXAM: 1 or 2 VIEW XRAY OF THE ABDOMEN 07/02/2024 09:53:25 PM COMPARISON: None available. CLINICAL HISTORY: Encounter for orogastric (OG) tube placement FINDINGS: LINES, TUBES AND DEVICES: Enteric tube tip is in the body of the stomach. The distal 5 cm of the tube is folded upon itself/kinked. Vascular stent in place overlying the expected region of the left common iliac vein. Cardiac paddles noted overlying the left hemiabdomen. BOWEL: Paucity of bowel gas. SOFT TISSUES: Vascular calcifications are seen in the abdomen and pelvis.  BONES: No acute fracture. IMPRESSION: 1. Enteric tube tip in the body of the stomach, with the distal 5 cm folded upon itself and kinked. Electronically signed by: Greig Pique MD 07/02/2024 10:02 PM EST RP Workstation: [id number]   CT CERVICAL SPINE WO CONTRAST Result Date: 07/02/2024 EXAM: CT CERVICAL SPINE WITHOUT CONTRAST 07/02/2024 02:47:57 PM TECHNIQUE: CT of the cervical spine was performed without the administration of intravenous contrast. Multiplanar reformatted images are provided for review. Automated exposure control, iterative reconstruction, and/or weight based adjustment of the mA/kV was utilized to reduce the radiation dose to as low as reasonably achievable. COMPARISON: None available. CLINICAL HISTORY: Acute severe posterior neck pain. FINDINGS: BONES AND ALIGNMENT: Interval collapse of the superior endplate of C6 and inferior endplate of C5 and exaggerated kyphosis may reflect marked progression of degenerative change. Sequelae of infection could have a similar appearance. Slight retrolisthesis at C5-C6, C6-C7 and C7-T1 are similar to the prior exam. DEGENERATIVE CHANGES: Ankylosis is present across the disc space anteriorly. Moderate irregular endplate changes are present at C6-C7 without loss of height. Mild endplate changes are present at C4-C5 without loss of height. The endplate collapse at C5-C6 is also a degenerative change. SOFT TISSUES: No prevertebral soft tissue swelling. Atherosclerotic changes are present in the proximal great vessels and at the carotid bifurcations bilaterally without definite stenosis. IMPRESSION: 1. Interval collapse of the superior endplate of C6 and inferior endplate of C5 with exaggerated kyphosis, which may reflect marked progression of degenerative change or sequelae of infection. MRI of the cervical spine without and with contrast may be useful for further evaluation. 2. Progressive degenerative endplate changes at C4-5 and C6-7 Electronically signed by:  Lonni Necessary MD 07/02/2024 04:00 PM EST RP Workstation: [id number]   US  ASCITES (ABDOMEN LIMITED) Result Date: 07/02/2024 EXAM: LIMITED ABDOMINAL ULTRASOUND FOR ASCITES EVALUATION TECHNIQUE: Limited real-time sonography of all 4 quadrants of the abdomen was performed for evaluation of ascites. COMPARISON: US  Abdomen Limited 03/14/2024; 04/30/2023. CLINICAL HISTORY: Cirrhosis (HCC). FINDINGS: RIGHT UPPER QUADRANT: Moderate volume of ascites noted. LEFT UPPER QUADRANT: Moderate volume of ascites noted. RIGHT LOWER QUADRANT: Moderate volume of ascites noted. LEFT LOWER QUADRANT: Minimal fluid in the left lower quadrant. OTHER: Moderate fluid midline. Limited visualization of the rest of the abdomen demonstrates no acute abnormality. IMPRESSION: 1. Moderate volume of ascites in the right upper quadrant, right lower quadrant, left upper quadrant, and midline. 2.  minimal fluid in the left lower quadrant. Electronically signed by: Norleen Boxer MD 07/02/2024 03:47 PM EST RP Workstation: [id number]   CT Cervical Spine Wo Contrast Result Date: 07/01/2024 EXAM: CT CERVICAL SPINE WITHOUT  CONTRAST 07/01/2024 03:13:30 PM TECHNIQUE: CT of the cervical spine was performed without the administration of intravenous contrast. Multiplanar reformatted images are provided for review. Automated exposure control, iterative reconstruction, and/or weight based adjustment of the mA/kV was utilized to reduce the radiation dose to as low as reasonably achievable. COMPARISON: 06/22/2024 and 02/16/2022 CLINICAL HISTORY: Ataxia, cervical trauma FINDINGS: BONES AND ALIGNMENT: No acute fracture or traumatic malalignment. Focal kyphosis at C5-C6 is stable. Straightening of the normal cervical lordosis otherwise is stable. DEGENERATIVE CHANGES: Chronic endplate changes at C5-C6. SOFT TISSUES: Adjacent soft tissues are unremarkable. Calcifications are again seen within the thyroid  bilaterally. Atherosclerotic calcifications are present in  the proximal great vessels and at the carotid bifurcations without definite stenosis. A right IJ catheter is in place. No prevertebral soft tissue swelling. IMPRESSION: 1. No acute cervical spine findings. 2. Stable chronic endplate changes and focal kyphosis at C5-6. Electronically signed by: Lonni Necessary MD 07/01/2024 03:57 PM EST RP Workstation: [id number]   CT HEAD WO CONTRAST ( ) Result Date: 07/01/2024 EXAM: CT HEAD WITHOUT CONTRAST 07/01/2024 03:13:30 PM TECHNIQUE: CT of the head was performed without the administration of intravenous contrast. Automated exposure control, iterative reconstruction, and/or weight based adjustment of the mA/kV was utilized to reduce the radiation dose to as low as reasonably achievable. COMPARISON: 06/22/2024 CLINICAL HISTORY: Head trauma, abnormal mental status (Age 37-64y). Fall today. FINDINGS: BRAIN AND VENTRICLES: No acute hemorrhage. No evidence of acute infarct. Chronic encephalomalacia within bilateral frontal, parietal, and right occipital lobes compatible with remote infarcts. Extensive periventricular and subcortical white matter low-density changes compatible with chronic microvascular ischemic change. No hydrocephalus. No extra-axial collection. No mass effect or midline shift. ORBITS: No acute abnormality. SINUSES: No acute abnormality. SOFT TISSUES AND SKULL: No acute soft tissue abnormality. No skull fracture. Atherosclerotic calcifications in large vessels of skull base. IMPRESSION: 1. No acute intracranial abnormality. 2. Chronic encephalomalacia within bilateral frontal, parietal, and right occipital lobes compatible with remote infarcts. 3. Extensive periventricular and subcortical white matter low-density changes compatible with chronic microvascular ischemic change. Electronically signed by: Lonni Necessary MD 07/01/2024 03:54 PM EST RP Workstation: [id number]   DG Wrist Complete Right Result Date: 07/01/2024 EXAM: 3 or more VIEW(S) XRAY  OF THE RIGHT WRIST 07/01/2024 12:22:33 PM COMPARISON: None available. CLINICAL HISTORY: fall FINDINGS: BONES AND JOINTS: No acute fracture. No malalignment. Subchondral cysts in the base of the first metacarpal bone with degenerative changes of the first [facility] joint. SOFT TISSUES: Unremarkable. IMPRESSION: 1. No acute fracture or dislocation. Electronically signed by: Waddell Calk MD 07/01/2024 12:51 PM EST RP Workstation: [id number]   DG Pelvis Portable Result Date: 07/01/2024 EXAM: 1 or 2 VIEW(S) XRAY OF THE PELVIS 07/01/2024 12:22:33 PM COMPARISON: 01/13/2016 CLINICAL HISTORY: fall FINDINGS: BONES AND JOINTS: No acute fracture. No malalignment. SOFT TISSUES: Likely calcified uterine fibroids. Severe atherosclerotic plaque. IMPRESSION: 1. No evidence of acute traumatic injury. Electronically signed by: Waddell Calk MD 07/01/2024 12:49 PM EST RP Workstation: [id number]   DG Chest Portable 1 View Result Date: 07/01/2024 EXAM: 1 VIEW(S) XRAY OF THE CHEST 07/01/2024 11:45:00 AM COMPARISON: 06/25/2024 CLINICAL HISTORY: SOB FINDINGS: LINES, TUBES AND DEVICES: Right chest wall dialysis catheter in place with tip overlying right atrium. Left brachial stent noted. LUNGS AND PLEURA: Pulmonary interstitial prominence with mild pulmonary edema. Decreased left pleural effusion. No pneumothorax. HEART AND MEDIASTINUM: Cardiomegaly. Coronary artery calcifications and stents. Surgical changes in mediastinum. Aortic calcification. BONES AND SOFT TISSUES: Intact sternotomy wires. IMPRESSION: 1. Pulmonary interstitial prominence with mild pulmonary edema, similar .  2. Decreased left pleural effusion. 3. Cardiomegaly. Electronically signed by: Waddell Calk MD 07/01/2024 11:55 AM EST RP Workstation: [id number]     ECHO 06/23/2024: 1. Left ventricular ejection fraction, by estimation, is 25 to 30%. The left ventricle has severely decreased function. The left ventricle demonstrates global hypokinesis. The left ventricular  internal cavity size was severely dilated. Left ventricular diastolic function could not be evaluated.   2. Right ventricular systolic function is severely reduced. The right  ventricular size is severely enlarged. Mildly increased right ventricular  wall thickness.   3. Left atrial size was mildly dilated.   4. Right atrial size was severely dilated.   5. The mitral valve is normal in structure. Mild mitral valve regurgitation.   6. Tricuspid valve regurgitation is severe.   7. The aortic valve is grossly normal. Aortic valve regurgitation is not visualized. Aortic valve sclerosis is present, with no evidence of aortic  valve stenosis.   TELEMETRY (personally reviewed): atrial fibrillation PVCs rate 80s  EKG (personally reviewed): AF RBBB rate 167 bpm  DATA reviewed by me 07/03/24: last 24h vitals tele labs imaging I/O, hospitalist progress note  Principal Problem:   Syncope and collapse Active Problems:   ESRD on hemodialysis (HCC)   Chronic hypotension   S/P CABG x 3   Mild dementia associated with other underlying disease, without behavioral disturbance, psychotic disturbance, mood disturbance, or anxiety (HCC)   Anemia of chronic disease   Paroxysmal atrial fibrillation (HCC)   Hypothyroidism   Chronic combined systolic and diastolic CHF (congestive heart failure) (HCC)   Hepatitis C antibody positive in blood   Other cirrhosis of liver (HCC)   Hypertension   Bipolar disorder in full remission, most recent episode unspecified type   Chronic pain syndrome   Other secondary kyphosis, cervical region   Cervical spondylosis    ASSESSMENT AND PLAN: MARZELLE RUTTEN is a 64 y.o. female with a past medical history of chronic HFrEF, coronary artery disease, atrial fibrillation on eliquis , COPD, ESRD on HD, bipolar disorder, hypotension on midodrine , hx CVA  who presented to the ED on 07/01/2024 for syncopal event at home. Cardiology was consulted for further evaluation.   #  Atrial fibrillation RVR # Paroxysmal atrial fibrillation # Syncope/ ?cardiac arrest # Chronic HFrEF # Coronary artery disease Patient brought to ED after being found down at home by family. Initially was admitted to the floor but on 07/02/24 she became unresponsive and was tachycardia, concern for wide complex tachycardia but has RBBB at baseline. Underwent ACLS, received 1 shock and 2 doses of epi with ROSC. Started on IV amiodarone .  -Continue IV amiodarone  infusion.  -Will likely plan to involve EP for additional recommendations tomorrow when NP is available.  -Continue IV heparin .  -Further management per primary team. No plan for invasive cardiac procedures at this time.  This patient's case was discussed and created with Dr. Florencio and he is in agreement.  Signed:  Danita Bloch, PA-C  07/03/2024, 9:17 AM [facility] Cardiology

## 2024-07-03 NOTE — Progress Notes (Signed)
 "  NAME:  [name], MRN:  [id number], DOB:  [birth year], LOS: 1 ADMISSION DATE:  07/01/2024, CONSULTATION DATE:  07/02/24 REFERRING MD:  Dr. Dorothyann, CHIEF COMPLAINT:  syncope   History of Present Illness:  [name] is a 64 y.o. female with a pmh of of ESRD on HD (MWF), COPD, CHF, bipolar disorder, chronic hypotension on midodrine , HLD and prior CVA who presented to [facility] ED via EMS following syncopal episode.   History was gathered per chart review Per patient, her normal dialysis schedule is MWF.  Patient was recently admitted to the intensive care unit earlier this month July 03, 2024.  At that time she was treated for acute on chronic hypoxemia with circulatory shock since then she has been discharged home and while at home was found down by her family and was brought into the hospital.  After receiving medical management on the mat on the floor and dialysis patient had episode of pulselessness with wide complex tachyarrhythmia.  Rapid response was initiated and ACLS was started on patient.  She received 2 rounds of epinephrine  as well as shock with return of spontaneous circulation.  1 dose of IV amiodarone  was delivered and cardiology was contacted with initiation of amiodarone  infusion.  Patient was brought into the ICU on ventilator and intubated by EDP during CODE BLUE.  07/03/24- overnight, unresposive with AF. MWF(HD). Will meet with family. Will continue to monitor and re-evaluate post dialysis.   Pertinent Medical History  Hypotension & Hypertension HLD HFrEF ESRD on HD Stroke CAD s/p CABG x 3 RBBB Pulmonary hypertension PVD Ischemic cardiomyopathy Hyperkalemia Hypothyroidism Atrial fibrillation on eliquis  Anemia of chronic kidney disease   Significant Hospital Events: Including procedures, antibiotic start and stop dates in addition to other pertinent events   07/02/24-transferred to ICU post CODE BLUE on medical floor status post ACLS with ROSC     Objective    Blood pressure (!) 89/58, pulse (!) 53, temperature 97.9 F (36.6 C), temperature source Axillary, resp. rate 16, height 5' 6 (1.676 m), weight 67 kg, SpO2 94%.    Vent Mode: PRVC FiO2 (%):  [35 %-100 %] 35 % Set Rate:  [16 bmp-18 bmp] 16 bmp Vt Set:  [450 mL] 450 mL PEEP:  [5 cmH20] 5 cmH20 Plateau Pressure:  [15 cmH20-18 cmH20] 17 cmH20   Intake/Output Summary (Last 24 hours) at 07/03/2024 0827 Last data filed at 07/03/2024 0813 Gross per 24 hour  Intake 2103.24 ml  Output 2000 ml  Net 103.24 ml   Filed Weights   07/01/24 1132 07/02/24 1038 07/02/24 1400  Weight: 70.5 kg 69 kg 67 kg    Examination: General: Adult female, intubated unresponseve HENT: atraumatic, normocephalic, neck supple  Cardiovascular: ventricular rhythm, S1 S2, no r/m/g Pulm: Regular, non labored on [place] , breath sounds equal throughout GI: soft, non-tender, non-distended, no rebound/guarding, bs x 4 Extremities: warm/dry, pulses + 2 R/P, no edema noted Neuro: GCS4T GU: deferred   Assessment and Plan   # Cardiac arrest Hx: BiVfailure, CAD s/p CABG, Ischemic Cardiomyopathy, CVA       Wide-complex tachyarrhythmia status post ACLS with shock and return of spontaneous circulation.  Patient currently on mechanical ventilation status post endotracheal intubation.  Cardiology consultation placed-appreciate input Dr. Callwood-amiodarone  infusion initiated        -hx of AF with biventricular failure        -reviewed with cardiologist        - RBBB on EKG         -  LR x 1 , digoxin  x 1        - s/p fem CVL on right - levophed  infusion  - Continuous cardiac monitoring - Vasopressors to maintain MAP goal >65 ~ currently on levophed  gtt - EKG prn - ECHO ordered #COPD - xopenex  #HFrEF #Atrial Fibrillation on Eliquis  #ESRD on HD (MWF)- s/p HD #GERD #Nausea and Vomiting Hx: GERD, HCV, Diverticulosis - Monitor hepatic function as indication - Antiemetics prn - Constipation  protocol prn - Diet: NPO except with meds #Chronic Thrombocytopenia #VTE prophylaxis - Trend CBC  - Transfuse if Hgb < 7 or platelets <10 - Monitor for s/sx of bleeding - Continue eliquis  5mg  BID  #Chronic Pain s/t Vascular Ulcers - percocet 5-325 mg x 1 - supportive care   Labs   CBC: Recent Labs  Lab 06/28/24 0226 07/01/24 1336 07/02/24 0526 07/02/24 1646 07/03/24 0543  WBC 6.2 12.0* 12.2* 14.2* 14.7*  NEUTROABS  --  10.4*  --   --   --   HGB 11.3* 12.7 12.3 12.2 12.4  HCT 35.1* 38.7 37.0 37.3 37.9  MCV 98.9 97.0 95.9 96.4 96.2  PLT 101* 82* 68* 70* 63*    Basic Metabolic Panel: Recent Labs  Lab 07/01/24 1336 07/01/24 2155 07/02/24 0526 07/02/24 1646 07/03/24 0543  NA 138 135 135 136 134*  K 4.0 4.3 4.3 3.9 4.5  CL 92* 93* 92* 90* 90*  CO2 21* 19* 23 23 23   GLUCOSE 70 90 106* 154* 169*  BUN 28* 32* 35* 18 26*  CREATININE 5.07* 5.43* 5.79* 3.57* 4.09*  CALCIUM  10.4* 10.2 10.5* 10.3 10.2  MG  --  2.1  --   --   --    GFR: Estimated Creatinine Clearance: 13.5 mL/min (A) (by C-G formula based on SCr of 4.09 mg/dL (H)). Recent Labs  Lab 06/27/24 1322 06/28/24 0226 07/01/24 1336 07/02/24 0526 07/02/24 1646 07/02/24 1647 07/02/24 2016 07/03/24 0543  WBC  --    < > 12.0* 12.2* 14.2*  --   --  14.7*  LATICACIDVEN 1.2  --  1.9  --   --  5.4* 3.7*  --    < > = values in this interval not displayed.    Liver Function Tests: Recent Labs  Lab 07/01/24 1336 07/01/24 2155 07/02/24 0526 07/02/24 1646  AST 36 36 37 54*  ALT 15 15 14 19   ALKPHOS 71 68 72 81  BILITOT 1.2 1.2 1.3* 1.4*  PROT 7.2 6.8 6.9 6.6  ALBUMIN  3.8 3.5 3.5 3.5   No results for input(s): LIPASE, AMYLASE in the last 168 hours. Recent Labs  Lab 07/01/24 1726  AMMONIA 15    ABG    Component Value Date/Time   PHART 7.5 (H) 07/02/2024 2058   PCO2ART 33 07/02/2024 2058   PO2ART 202 (H) 07/02/2024 2058   HCO3 25.7 07/02/2024 2058   TCO2 26 04/20/2024 0708   ACIDBASEDEF 0.2  07/01/2024 2155   O2SAT 99.2 07/02/2024 2058     Coagulation Profile: No results for input(s): INR, PROTIME in the last 168 hours.  Cardiac Enzymes: No results for input(s): CKTOTAL, CKMB, CKMBINDEX, TROPONINI in the last 168 hours.  HbA1C: No results found for: HGBA1C  CBG: Recent Labs  Lab 07/01/24 1739 07/01/24 1834 07/01/24 2029 07/02/24 1550 07/02/24 1609  GLUCAP 112* 79 83 82 79    Review of Systems:    10 point review of systems unable to conduct due to acutely comatose state on mechanical ventilation  Past Medical History:  She,  has a past medical history of Acute lacunar infarction [facility]) (03/03/2014), Adult behavior problems, Anemia of chronic renal failure, Aortic atherosclerosis, Arthritis, Atrial fibrillation and flutter (HCC), Bipolar disorder (HCC), Cardiomegaly, Cerebral microvascular disease, CHF (congestive heart failure) (HCC), Chicken pox, Cholelithiasis, Chronic respiratory failure with hypoxia (HCC) (12/19/2021), COPD (chronic obstructive pulmonary disease) (HCC), Coronary artery disease, DDD (degenerative disc disease), cervical, DDD (degenerative disc disease), thoracolumbar, Diverticulosis, Dyspnea, ESRD (end stage renal disease) on dialysis (M,W,F), GERD (gastroesophageal reflux disease), Gout, HCV (hepatitis C virus), Headache, HFrEF (heart failure with reduced ejection fraction) (HCC), History of delirium (04/12/2014), History of substance abuse (HCC), Hyperkalemia (11/13/2014), Hyperlipidemia, Hypertension, Hypotension (02/24/2014), Hypothyroidism, Ischemic cardiomyopathy, Nose colonized with MRSA (02/01/2024), NSTEMI (non-ST elevated myocardial infarction) (HCC) (10/08/2013), NSTEMI (non-ST elevated myocardial infarction) (HCC) (01/23/2002), Obesity, 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, On apixaban  therapy, Peripheral vascular disease, Pneumonia, Postoperative anemia due to acute blood loss (02/27/2014), Postoperative  cerebrovascular infarction following cardiac surgery (02/21/2014), Pulmonary hypertension (HCC), RBBB (right bundle branch block), Renal osteodystrophy, S/P CABG x 3 (02/20/2014), Stroke (HCC) (05/23/2010), and Syncope and collapse (07/25/2020).   Surgical History:   Past Surgical History:  Procedure Laterality Date   A/V FISTULAGRAM Left 03/30/2019   Procedure: A/V FISTULAGRAM;  Surgeon: Marea Selinda RAMAN, MD;  Location: [facility];  Service: Cardiovascular;  Laterality: Left;   A/V FISTULAGRAM Left 10/06/2019   Procedure: A/V FISTULAGRAM;  Surgeon: Marea Selinda RAMAN, MD;  Location: [facility];  Service: Cardiovascular;  Laterality: Left;   A/V FISTULAGRAM Left 05/19/2021   Procedure: A/V FISTULAGRAM;  Surgeon: Marea Selinda RAMAN, MD;  Location: [facility];  Service: Cardiovascular;  Laterality: Left;   A/V FISTULAGRAM Left 01/29/2022   Procedure: A/V Fistulagram;  Surgeon: Marea Selinda RAMAN, MD;  Location: [facility];  Service: Cardiovascular;  Laterality: Left;   A/V FISTULAGRAM Left 04/30/2022   Procedure: A/V Fistulagram;  Surgeon: Marea Selinda RAMAN, MD;  Location: [facility];  Service: Cardiovascular;  Laterality: Left;   A/V FISTULAGRAM Left 08/20/2022   Procedure: A/V Fistulagram;  Surgeon: Marea Selinda RAMAN, MD;  Location: [facility];  Service: Cardiovascular;  Laterality: Left;   A/V FISTULAGRAM N/A 12/03/2022   Procedure: A/V Fistulagram;  Surgeon: Marea Selinda RAMAN, MD;  Location: [facility];  Service: Cardiovascular;  Laterality: N/A;   A/V FISTULAGRAM Left 02/11/2023   Procedure: A/V Fistulagram;  Surgeon: Marea Selinda RAMAN, MD;  Location: [facility];  Service: Cardiovascular;  Laterality: Left;   A/V FISTULAGRAM Left 02/07/2024   Procedure: A/V Fistulagram;  Surgeon: Marea Selinda RAMAN, MD;  Location: [facility];  Service: Cardiovascular;  Laterality: Left;   COLONOSCOPY     COLONOSCOPY WITH PROPOFOL  N/A 04/22/2021   Procedure:  COLONOSCOPY WITH PROPOFOL ;  Surgeon: Maryruth Ole DASEN, MD;  Location: [facility];  Service: Endoscopy;  Laterality: N/A;   CORONARY ANGIOPLASTY WITH STENT PLACEMENT Left 01/25/2002   Procedure: CORONARY ANGIOPLASTY WITH STENT PLACEMENT; Location: [facility]; Surgeon: Margie Lovelace, MD   CORONARY ARTERY BYPASS GRAFT N/A 02/20/2014   Procedure: CORONARY ARTERY BYPASS GRAFT; Location: [facility]; Surgeon: Reyes Fruits, MD   DIALYSIS FISTULA CREATION     DIALYSIS/PERMA CATHETER INSERTION N/A 04/08/2023   Procedure: DIALYSIS/PERMA CATHETER INSERTION;  Surgeon: Marea Selinda RAMAN, MD;  Location: [facility];  Service: Cardiovascular;  Laterality: N/A;   DIALYSIS/PERMA CATHETER REMOVAL N/A 07/06/2023   Procedure: DIALYSIS/PERMA CATHETER REMOVAL;  Surgeon: [name],  Cordella MATSU, MD;  Location: [facility];  Service: Cardiovascular;  Laterality: N/A;   ESOPHAGOGASTRODUODENOSCOPY     ESOPHAGOGASTRODUODENOSCOPY N/A 04/22/2021   Procedure: ESOPHAGOGASTRODUODENOSCOPY (EGD);  Surgeon: Maryruth Ole DASEN, MD;  Location: [facility];  Service: Endoscopy;  Laterality: N/A;   FLEXIBLE SIGMOIDOSCOPY N/A 02/23/2019   Procedure: FLEXIBLE SIGMOIDOSCOPY;  Surgeon: Gaylyn Gladis PENNER, MD;  Location: [facility] ENDOSCOPY;  Service: Endoscopy;  Laterality: N/A;   IABP INSERTION  02/20/2014   Procedure: INTRA-AORTIC BALLOON PUMP INSERTION; Location: [facility]; Surgeon: Reyes Fruits, MD   INSERTION OF ARTERIOVENOUS (AV) ARTEGRAFT ARM Right 03/09/2024   Procedure: INSERTION, GRAFT, ARTERIOVENOUS, UPPER EXTREMITY;  Surgeon: Marea Selinda RAMAN, MD;  Location: [facility];  Service: Vascular;  Laterality: Right;  BRACHIAL AXILLARY   INSERTION OF DIALYSIS CATHETER N/A 04/20/2024   Procedure: INSERTION OF DIALYSIS CATHETER;  Surgeon: Marea Selinda RAMAN, MD;  Location: [facility];  Service: Vascular;  Laterality: N/A;   LEFT HEART CATH AND CORONARY ANGIOGRAPHY Left 01/24/2002   Procedure: LEFT HEART CATH AND CORONARY ANGIOGRAPHY; Location: [facility];  Surgeon: Margie Lovelace, MD   LEFT HEART CATH AND CORONARY ANGIOGRAPHY Left 05/25/2012   Procedure: LEFT HEART CATH AND CORONARY ANGIOGRAPHY; Location: [facility]; Surgeon: Margie Lovelace, MD   LEFT HEART CATH AND CORONARY ANGIOGRAPHY Left 10/09/2013   Procedure: LEFT HEART CATH AND CORONARY ANGIOGRAPHY; Location: [facility]; Surgeon: Vinie Jude, MD   LEFT HEART CATH AND CORS/GRAFTS ANGIOGRAPHY N/A 08/08/2018   Procedure: LEFT HEART CATH AND CORS/GRAFTS ANGIOGRAPHY;  Surgeon: Fernand Denyse LABOR, MD;  Location: [facility];  Service: Cardiovascular;  Laterality: N/A;   LEFT HEART CATH AND CORS/GRAFTS ANGIOGRAPHY N/A 01/30/2019   Procedure: LEFT HEART CATH AND CORS/GRAFTS ANGIOGRAPHY;  Surgeon: Fernand Denyse LABOR, MD;  Location: [facility];  Service: Cardiovascular;  Laterality: N/A;   LIGATION ARTERIOVENOUS GORTEX GRAFT Right 04/20/2024   Procedure: LIGATION ARTERIOVENOUS GORTEX GRAFT;  Surgeon: Marea Selinda RAMAN, MD;  Location: [facility];  Service: Vascular;  Laterality: Right;   LOWER EXTREMITY ANGIOGRAPHY Left 04/24/2024   Procedure: Lower Extremity Angiography;  Surgeon: Marea Selinda RAMAN, MD;  Location: [facility];  Service: Cardiovascular;  Laterality: Left;   ultrasound guided pericardiocentesis     UPPER EXTREMITY ANGIOGRAPHY Right 03/30/2024   Procedure: Upper Extremity Angiography;  Surgeon: Marea Selinda RAMAN, MD;  Location: [facility];  Service: Cardiovascular;  Laterality: Right;     Social History:   reports that she has been smoking cigarettes. She has a 5 pack-year smoking history. She has never used smokeless tobacco. She reports that she does not drink alcohol and does not use drugs.   Family History:  Her family history includes Breast cancer in her maternal aunt and maternal grandmother; Cancer in an other family member; Diabetes type II in her maternal grandmother and mother; Hypertension in her father, mother, sister, and another family member; Kidney disease in her  father; Ovarian cancer in her mother; Renal Disease in an other family member.   Allergies Allergies[1]   Home Medications  Prior to Admission medications  Medication Sig Start Date End Date Taking? Authorizing Provider  albuterol  (VENTOLIN  HFA) 108 (90 Base) MCG/ACT inhaler Inhale 1-2 puffs into the lungs every 6 (six) hours as needed for wheezing or shortness of breath. 01/04/24   Orlean Alan HERO, FNP  apixaban  (ELIQUIS ) 5 MG TABS tablet Take 5 mg by mouth 2 (two) times daily. 09/11/19   [provider]  cephALEXin  (KEFLEX ) 500 MG capsule Take 1 capsule (  500 mg total) by mouth 2 (two) times daily. 05/23/24   Fernand Fredy RAMAN, MD  colchicine  0.6 MG tablet Take 1 tablet (0.6 mg total) by mouth once for 1 dose. Patient not taking: Reported on 05/23/2024 04/13/24 05/19/24  [place]oggins, [facility], NP  doxycycline  (VIBRA -TABS) 100 MG tablet Take 1 tablet (100 mg total) by mouth 2 (two) times daily. Patient not taking: Reported on 05/23/2024 05/09/24   Janit Thresa HERO, DPM  hydrOXYzine  (ATARAX /VISTARIL ) 50 MG tablet Take 50 mg by mouth at bedtime.    [provider]  levothyroxine  (SYNTHROID ) 75 MCG tablet Take 75 mcg by mouth daily before breakfast.  11/02/18   [provider]  lidocaine -prilocaine  (EMLA ) cream Apply 1 Application topically once. 01/22/21   [provider]  metoprolol  succinate (TOPROL -XL) 25 MG 24 hr tablet Take 1 tablet by mouth every morning. 08/27/23 08/26/24  [provider]  midodrine  (PROAMATINE ) 10 MG tablet Take 1 tablet (10 mg total) by mouth 3 (three) times daily. 02/17/24   Fernand Denyse LABOR, MD  oxyCODONE -acetaminophen  (PERCOCET) 5-325 MG tablet Take 1 tablet by mouth 2 (two) times daily as needed for severe pain (pain score 7-10). 05/23/24   Fernand Fredy RAMAN, MD  pantoprazole  (PROTONIX ) 40 MG tablet Take 40 mg by mouth every morning. 11/29/23   [provider]  ranolazine  (RANEXA ) 1000 MG SR tablet Take 1,000 mg by mouth 2 (two) times  daily. 03/18/23   [provider]  rosuvastatin  (CRESTOR ) 20 MG tablet Take 20 mg by mouth at bedtime. 11/07/21   [provider]  sevelamer  carbonate (RENVELA ) 800 MG tablet Take 3,200 mg by mouth 3 (three) times daily with meals.    [provider]  triamcinolone  cream (KENALOG ) 0.1 % Apply 1 Application topically 2 (two) times daily. 11/02/23   Orlean Alan HERO, FNP     Critical care provider statement:   Total critical care time: 63 minutes   Performed by: Parris MD   Critical care time was exclusive of separately billable procedures and treating other patients.   Critical care was necessary to treat or prevent imminent or life-threatening deterioration.   Critical care was time spent personally by me on the following activities: development of treatment plan with patient and/or surrogate as well as nursing, discussions with consultants, evaluation of patient's response to treatment, examination of patient, obtaining history from patient or surrogate, ordering and performing treatments and interventions, ordering and review of laboratory studies, ordering and review of radiographic studies, pulse oximetry and re-evaluation of patient's condition.    Davier Tramell, M.D.  Pulmonary & Critical Care Medicine              [1]  Allergies Allergen Reactions   Morphine  And Codeine Hives   Levaquin [Levofloxacin] Itching and Other (See Comments)    Severe itching; prickly sensation; severe left leg pain; immobility.   Enalapril  Other (See Comments)    hallucinations   Latex Rash   Tape Rash and Other (See Comments)   "

## 2024-07-03 NOTE — Consult Note (Signed)
 PHARMACY - PHYSICIAN COMMUNICATION CRITICAL VALUE ALERT - BLOOD CULTURE IDENTIFICATION (BCID)  [name] is an 64 y.o. female who [facility] to [facility] on [facility]01[facility]24 with a chief complaint of cardiac arrest admitted to ICU.  Assessment:  BCID = 1/4 bottles staph epi. Mec A/c detected, likely contaminate. BCID also grew staph species, rocephin  recommended per protocol.  Name of physician (or Provider) Contacted: MD. Parris  Current antibiotics: Doxycycline   Changes to prescribed antibiotics recommended:  Recommendations accepted by provider  Results for orders placed or performed during the hospital encounter of 07/01/24  Blood Culture ID Panel (Reflexed) (Collected: 07/02/2024  9:25 PM)  Result Value Ref Range   Enterococcus faecalis NOT DETECTED NOT DETECTED   Enterococcus Faecium NOT DETECTED NOT DETECTED   Listeria monocytogenes NOT DETECTED NOT DETECTED   Staphylococcus species DETECTED (A) NOT DETECTED   Staphylococcus aureus (BCID) NOT DETECTED NOT DETECTED   Staphylococcus epidermidis DETECTED (A) NOT DETECTED   Staphylococcus lugdunensis NOT DETECTED NOT DETECTED   Streptococcus species DETECTED (A) NOT DETECTED   Streptococcus agalactiae NOT DETECTED NOT DETECTED   Streptococcus pneumoniae NOT DETECTED NOT DETECTED   Streptococcus pyogenes NOT DETECTED NOT DETECTED   A.calcoaceticus-baumannii NOT DETECTED NOT DETECTED   Bacteroides fragilis NOT DETECTED NOT DETECTED   Enterobacterales NOT DETECTED NOT DETECTED   Enterobacter cloacae complex NOT DETECTED NOT DETECTED   Escherichia coli NOT DETECTED NOT DETECTED   Klebsiella aerogenes NOT DETECTED NOT DETECTED   Klebsiella oxytoca NOT DETECTED NOT DETECTED   Klebsiella pneumoniae NOT DETECTED NOT DETECTED   Proteus species NOT DETECTED NOT DETECTED   Salmonella species NOT DETECTED NOT DETECTED   Serratia marcescens NOT DETECTED NOT DETECTED   Haemophilus influenzae NOT DETECTED NOT DETECTED   Neisseria  meningitidis NOT DETECTED NOT DETECTED   Pseudomonas aeruginosa NOT DETECTED NOT DETECTED   Stenotrophomonas maltophilia NOT DETECTED NOT DETECTED   Candida albicans NOT DETECTED NOT DETECTED   Candida auris NOT DETECTED NOT DETECTED   Candida glabrata NOT DETECTED NOT DETECTED   Candida krusei NOT DETECTED NOT DETECTED   Candida parapsilosis NOT DETECTED NOT DETECTED   Candida tropicalis NOT DETECTED NOT DETECTED   Cryptococcus neoformans/gattii NOT DETECTED NOT DETECTED   Methicillin resistance mecA/C DETECTED (A) NOT DETECTED   Annabella LOISE Banks, PharmD Clinical Pharmacist 07/03/2024 6:47 PM

## 2024-07-03 NOTE — Consult Note (Signed)
 "   Consult requested by:  Dr. Aleskerov   Consult requested for:  C spine findings   Primary Physician:  Orlean Alan HERO, FNP  History of Present Illness: 07/03/2024 [name] initially presented for syncope.  She has been admitted multiple times in the past.  She has multiple severe medical issues.  She was admitted to the hospital and suffered a CODE BLUE event yesterday.  She was intubated and workup initiated.  CT scan of the cervical spine was performed.  I was consulted for management recommendations.  During the event yesterday, she underwent chest compressions as well as administration of epinephrine .  She was defibrillated.  She has since been poorly responsive though is under heavy sedation.  She is unable to participate in history taking.  The symptoms are causing a significant impact on the patient's life.   I have utilized the care everywhere function in epic to review the outside records available from external health systems.  Review of Systems:  Unobtainable  Past Medical History: Past Medical History:  Diagnosis Date   Acute lacunar infarction (HCC) 03/03/2014   a.) MRI brain 03/03/2014: two acute punctate lacunar infarcts anteriorly in the BILATERAL centrum semiovale   Adult behavior problems    Anemia of chronic renal failure    Aortic atherosclerosis    Arthritis    Atrial fibrillation and flutter (HCC)    a.) CHA2DS2VASc = 6 (sex, HFrEF, HTN, CVA x 2, prior MI/vascular disease) as of 02/04/2024; b.) rate/rhythm maintained on oral metoprolol  succinate; chronically anticoagulated with apixaban    Bipolar disorder (HCC)    Cardiomegaly    Cerebral microvascular disease    CHF (congestive heart failure) (HCC)    Chicken pox    Cholelithiasis    Chronic respiratory failure with hypoxia (HCC) 12/19/2021   COPD (chronic obstructive pulmonary disease) (HCC)    Coronary artery disease    DDD (degenerative disc disease), cervical    DDD  (degenerative disc disease), thoracolumbar    Diverticulosis    Dyspnea    ESRD (end stage renal disease) on dialysis (M,W,F)    GERD (gastroesophageal reflux disease)    Gout    HCV (hepatitis C virus)    Headache    HFrEF (heart failure with reduced ejection fraction) (HCC)    History of delirium 04/12/2014   History of substance abuse (HCC)    a.) tobacco + cocaine   Hyperkalemia 11/13/2014   Hyperlipidemia    Hypertension    Hypotension 02/24/2014   Hypothyroidism    Ischemic cardiomyopathy    Nose colonized with MRSA 02/01/2024   a.) presurgical PCR (+) on 02/01/2024 prior to A/V FISTULAGRAM; b.) presurgical PCR (+) on 03/07/2024 prior to AV FISTULA CREATION   NSTEMI (non-ST elevated myocardial infarction) (HCC) 10/08/2013   NSTEMI (non-ST elevated myocardial infarction) (HCC) 01/23/2002   a.) LHC 01/24/2002 --> multi-vessel CAD with IRA being 90% OM2 --> delayed PCI until 01/25/2002 --> 3.0 x 23 mm BX Velocity stent   Obesity    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    On apixaban  therapy    Peripheral vascular disease    Pneumonia    Postoperative anemia due to acute blood loss 02/27/2014   Postoperative cerebrovascular infarction following cardiac surgery 02/21/2014   a.) developed RIGHT upper/lower extremity weakness following cardiac revascularization --> neuro consult and head CT --> Hypodensity noted in the area of the LEFT motor cortex consistent with a component of acute ischemia   Pulmonary hypertension (  HCC)    RBBB (right bundle branch block)    Renal osteodystrophy    S/P CABG x 3 02/20/2014   a.) LIMA-LAD, SVG-OM1, SVG-PDA   Stroke (HCC) 05/23/2010   a.) brain MRI 05/23/2010: BILATERAL acute infarcts and old prior infarcts; RIGHT frontal lobe extending into the white matter. Small focus of restricted diffusion in the cortex of the medial LEFT frontoparietal lobe. Periventricular T2 and multiple foci of T2 hyperintensity in the subcortical white matter  and increased FLAIR signal in the cortex of the LEFT frontal lobe.   Syncope and collapse 07/25/2020    Past Surgical History: Past Surgical History:  Procedure Laterality Date   A/V FISTULAGRAM Left 03/30/2019   Procedure: A/V FISTULAGRAM;  Surgeon: Marea Selinda RAMAN, MD;  Location: [facility];  Service: Cardiovascular;  Laterality: Left;   A/V FISTULAGRAM Left 10/06/2019   Procedure: A/V FISTULAGRAM;  Surgeon: Marea Selinda RAMAN, MD;  Location: [facility];  Service: Cardiovascular;  Laterality: Left;   A/V FISTULAGRAM Left 05/19/2021   Procedure: A/V FISTULAGRAM;  Surgeon: Marea Selinda RAMAN, MD;  Location: [facility];  Service: Cardiovascular;  Laterality: Left;   A/V FISTULAGRAM Left 01/29/2022   Procedure: A/V Fistulagram;  Surgeon: Marea Selinda RAMAN, MD;  Location: [facility];  Service: Cardiovascular;  Laterality: Left;   A/V FISTULAGRAM Left 04/30/2022   Procedure: A/V Fistulagram;  Surgeon: Marea Selinda RAMAN, MD;  Location: [facility];  Service: Cardiovascular;  Laterality: Left;   A/V FISTULAGRAM Left 08/20/2022   Procedure: A/V Fistulagram;  Surgeon: Marea Selinda RAMAN, MD;  Location: [facility];  Service: Cardiovascular;  Laterality: Left;   A/V FISTULAGRAM N/A 12/03/2022   Procedure: A/V Fistulagram;  Surgeon: Marea Selinda RAMAN, MD;  Location: [facility];  Service: Cardiovascular;  Laterality: N/A;   A/V FISTULAGRAM Left 02/11/2023   Procedure: A/V Fistulagram;  Surgeon: Marea Selinda RAMAN, MD;  Location: [facility];  Service: Cardiovascular;  Laterality: Left;   A/V FISTULAGRAM Left 02/07/2024   Procedure: A/V Fistulagram;  Surgeon: Marea Selinda RAMAN, MD;  Location: [facility];  Service: Cardiovascular;  Laterality: Left;   COLONOSCOPY     COLONOSCOPY WITH PROPOFOL  N/A 04/22/2021   Procedure: COLONOSCOPY WITH PROPOFOL ;  Surgeon: Maryruth Ole DASEN, MD;  Location: [facility];  Service: Endoscopy;  Laterality: N/A;   CORONARY  ANGIOPLASTY WITH STENT PLACEMENT Left 01/25/2002   Procedure: CORONARY ANGIOPLASTY WITH STENT PLACEMENT; Location: [facility]; Surgeon: Margie Lovelace, MD   CORONARY ARTERY BYPASS GRAFT N/A 02/20/2014   Procedure: CORONARY ARTERY BYPASS GRAFT; Location: [facility]; Surgeon: Reyes Fruits, MD   DIALYSIS FISTULA CREATION     DIALYSIS/PERMA CATHETER INSERTION N/A 04/08/2023   Procedure: DIALYSIS/PERMA CATHETER INSERTION;  Surgeon: Marea Selinda RAMAN, MD;  Location: [facility];  Service: Cardiovascular;  Laterality: N/A;   DIALYSIS/PERMA CATHETER REMOVAL N/A 07/06/2023   Procedure: DIALYSIS/PERMA CATHETER REMOVAL;  Surgeon: [name] Cordella MATSU, MD;  Location: [facility];  Service: Cardiovascular;  Laterality: N/A;   ESOPHAGOGASTRODUODENOSCOPY     ESOPHAGOGASTRODUODENOSCOPY N/A 04/22/2021   Procedure: ESOPHAGOGASTRODUODENOSCOPY (EGD);  Surgeon: Maryruth Ole DASEN, MD;  Location: [facility];  Service: Endoscopy;  Laterality: N/A;   FLEXIBLE SIGMOIDOSCOPY N/A 02/23/2019   Procedure: FLEXIBLE SIGMOIDOSCOPY;  Surgeon: Gaylyn Gladis PENNER, MD;  Location: [facility];  Service: Endoscopy;  Laterality: N/A;   IABP INSERTION  02/20/2014   Procedure: INTRA-AORTIC BALLOON PUMP INSERTION; Location: [facility]; Surgeon: Reyes Fruits, MD  INSERTION OF ARTERIOVENOUS (AV) ARTEGRAFT ARM Right 03/09/2024   Procedure: INSERTION, GRAFT, ARTERIOVENOUS, UPPER EXTREMITY;  Surgeon: Marea Selinda RAMAN, MD;  Location: [facility];  Service: Vascular;  Laterality: Right;  BRACHIAL AXILLARY   INSERTION OF DIALYSIS CATHETER N/A 04/20/2024   Procedure: INSERTION OF DIALYSIS CATHETER;  Surgeon: Marea Selinda RAMAN, MD;  Location: [facility];  Service: Vascular;  Laterality: N/A;   LEFT HEART CATH AND CORONARY ANGIOGRAPHY Left 01/24/2002   Procedure: LEFT HEART CATH AND CORONARY ANGIOGRAPHY; Location: [facility]; Surgeon: Margie Lovelace, MD   LEFT HEART CATH AND CORONARY ANGIOGRAPHY Left 05/25/2012   Procedure: LEFT HEART CATH AND CORONARY  ANGIOGRAPHY; Location: [facility]; Surgeon: Margie Lovelace, MD   LEFT HEART CATH AND CORONARY ANGIOGRAPHY Left 10/09/2013   Procedure: LEFT HEART CATH AND CORONARY ANGIOGRAPHY; Location: [facility]; Surgeon: Vinie Jude, MD   LEFT HEART CATH AND CORS/GRAFTS ANGIOGRAPHY N/A 08/08/2018   Procedure: LEFT HEART CATH AND CORS/GRAFTS ANGIOGRAPHY;  Surgeon: Fernand Denyse LABOR, MD;  Location: [facility];  Service: Cardiovascular;  Laterality: N/A;   LEFT HEART CATH AND CORS/GRAFTS ANGIOGRAPHY N/A 01/30/2019   Procedure: LEFT HEART CATH AND CORS/GRAFTS ANGIOGRAPHY;  Surgeon: Fernand Denyse LABOR, MD;  Location: [facility];  Service: Cardiovascular;  Laterality: N/A;   LIGATION ARTERIOVENOUS GORTEX GRAFT Right 04/20/2024   Procedure: LIGATION ARTERIOVENOUS GORTEX GRAFT;  Surgeon: Marea Selinda RAMAN, MD;  Location: [facility];  Service: Vascular;  Laterality: Right;   LOWER EXTREMITY ANGIOGRAPHY Left 04/24/2024   Procedure: Lower Extremity Angiography;  Surgeon: Marea Selinda RAMAN, MD;  Location: [facility];  Service: Cardiovascular;  Laterality: Left;   ultrasound guided pericardiocentesis     UPPER EXTREMITY ANGIOGRAPHY Right 03/30/2024   Procedure: Upper Extremity Angiography;  Surgeon: Marea Selinda RAMAN, MD;  Location: [facility];  Service: Cardiovascular;  Laterality: Right;    Allergies: Allergies as of 07/01/2024 - Review Complete 07/01/2024  Allergen Reaction Noted   Morphine  and codeine Hives 05/29/2013   Levaquin [levofloxacin] Itching and Other (See Comments) 04/21/2018   Enalapril  Other (See Comments) 10/08/2022   Latex Rash 05/29/2013   Tape Rash and Other (See Comments) 12/04/2013    Medications: Active Medications[1]  Social History: Social History[2]  Family Medical History: Family History  Problem Relation Age of Onset   Hypertension Mother    Ovarian cancer Mother    Diabetes type II Mother    Hypertension Father    Kidney disease Father    Hypertension Sister     Diabetes type II Maternal Grandmother    Breast cancer Maternal Grandmother    Breast cancer Maternal Aunt    Hypertension Other    Cancer Other    Renal Disease Other     Physical Examination: Vitals:   07/03/24 0300 07/03/24 0400  BP: 116/72 116/78  Pulse: 82 (!) 103  Resp: 16 17  Temp:  98.3 F (36.8 C)  SpO2: 95% 92%    General: Intubated and sedated  She is heavily sedated.  She has no response to central or peripheral stimulation.  Cranial nerve examination shows minimally reactive bilateral pupils.  She has no additional cranial nerve examination.     Medical Decision Making  Imaging: CT C spine 07/02/2024 IMPRESSION: 1. Interval collapse of the superior endplate of C6 and inferior endplate of C5 with exaggerated kyphosis, which may reflect marked progression of degenerative change or sequelae of infection. MRI of the cervical spine without and with contrast may be useful for further evaluation. 2.  Progressive degenerative endplate changes at C4-5 and C6-7   Electronically signed by: Lonni Necessary MD 07/02/2024 04:00 PM EST RP Workstation: [id number]   I have personally reviewed the images and agree with the above interpretation.  Assessment and Plan: Ms. [name] is a pleasant 64 y.o. female with cervical spine findings concerning for either remote osteomyelitis, renal osteodystrophy, or progressive severe cervical spondylosis causing cervical kyphosis.  I agree with the radiologist recommendation to obtain MRI scan if medically appropriate given her critical illness.  I think it is unlikely she will be healthy enough to consider any surgical intervention to address the current findings on her CT scan.  She has baseline ascites, end-stage renal disease, and significant cardiomyopathy.  These are chronic findings-there is evidence of the beginning of this process on a CT scan from 2022.  As these appear to be mostly chronic findings, I do not feel that bracing  is likely to improve her recovery.  I will defer to the critical care team and cardiology team for further management recommendations regarding her general medical care.  Level 5 qualifier-the patient is critically ill and comatose.  She is unable to participate in history taking and review of systems.   I have communicated my recommendations to the requesting physician and coordinated care to facilitate these recommendations.     Akanksha Bellmore K. Clois MD, MPHS Neurosurgery     [1]  Current Meds  Medication Sig   albuterol  (VENTOLIN  HFA) 108 (90 Base) MCG/ACT inhaler Inhale 1-2 puffs into the lungs every 6 (six) hours as needed for wheezing or shortness of breath.   apixaban  (ELIQUIS ) 5 MG TABS tablet Take 5 mg by mouth 2 (two) times daily.   clobetasol cream (TEMOVATE) 0.05 % Apply 1 Application topically daily.   hydrOXYzine  (ATARAX /VISTARIL ) 50 MG tablet Take 50 mg by mouth 2 (two) times daily.   levothyroxine  (SYNTHROID ) 75 MCG tablet Take 75 mcg by mouth daily before breakfast.    lidocaine -prilocaine  (EMLA ) cream Apply 1 Application topically once.   [Paused] metoprolol  succinate (TOPROL -XL) 25 MG 24 hr tablet Take 1 tablet by mouth every morning.   midodrine  (PROAMATINE ) 10 MG tablet Take 1 tablet (10 mg total) by mouth 3 (three) times daily.   oxyCODONE -acetaminophen  (PERCOCET) 5-325 MG tablet Take 1 tablet by mouth every 6 (six) hours as needed for severe pain (pain score 7-10).   pantoprazole  (PROTONIX ) 40 MG tablet Take 1 tablet (40 mg total) by mouth every morning.  [2]  Social History Tobacco Use   Smoking status: Every Day    Current packs/day: 0.25    Average packs/day: 0.3 packs/day for 20.0 years (5.0 ttl pk-yrs)    Types: Cigarettes   Smokeless tobacco: Never   Tobacco comments:    smokes 1 - 1.5 cigarettes a day  Vaping Use   Vaping status: Never Used  Substance Use Topics   Alcohol use: No    Comment: social   Drug use: No   "

## 2024-07-03 NOTE — Progress Notes (Signed)
 Pt receives outpt HD at [facility] on MWF at 6:00am. Navigator following to assist with any HD needs.  Suzen Satchel Dialysis Navigator [phone]

## 2024-07-03 NOTE — Progress Notes (Signed)
 " [facility]  PROGRESS NOTE   Subjective:   Patient seen at bedside.   Yesterday evening she had cardiac arrest.  Now intubated and sedated.  Irregular rhythm noted Pressors-vasopressin  and norepinephrine  Sedation-propofol  Amiodarone  drip Heparin  drip    Objective:  Vital signs: Blood pressure (!) 100/54, pulse (!) 51, temperature 97.9 F (36.6 C), temperature source Axillary, resp. rate 14, height 5' 6 (1.676 m), weight 67 kg, SpO2 94%.  Intake/Output Summary (Last 24 hours) at 07/03/2024 1020 Last data filed at 07/03/2024 0813 Gross per 24 hour  Intake 1863.24 ml  Output 2000 ml  Net -136.76 ml   Filed Weights   07/01/24 1132 07/02/24 1038 07/02/24 1400  Weight: 70.5 kg 69 kg 67 kg     Physical Exam: General:  No acute distress  Head:  Normocephalic, atraumatic.  ET tube, OG tube in place  Eyes:  Anicteric  Lungs:   Clear to auscultation, ventilator assisted  Heart:  S1S2 no rubs  Abdomen:   Soft, nontender, bowel sounds present  Extremities:  + peripheral edema.  Neurologic:  sedated  Skin:  Left foot wound  Access: Right IJ dialysis catheter    Basic Metabolic Panel: Recent Labs  Lab 07/01/24 1336 07/01/24 2155 07/02/24 0526 07/02/24 1646 07/03/24 0543  NA 138 135 135 136 134*  K 4.0 4.3 4.3 3.9 4.5  CL 92* 93* 92* 90* 90*  CO2 21* 19* 23 23 23   GLUCOSE 70 90 106* 154* 169*  BUN 28* 32* 35* 18 26*  CREATININE 5.07* 5.43* 5.79* 3.57* 4.09*  CALCIUM  10.4* 10.2 10.5* 10.3 10.2  MG  --  2.1  --   --   --    GFR: Estimated Creatinine Clearance: 13.5 mL/min (A) (by C-G formula based on SCr of 4.09 mg/dL (H)).  Liver Function Tests: Recent Labs  Lab 07/01/24 1336 07/01/24 2155 07/02/24 0526 07/02/24 1646  AST 36 36 37 54*  ALT 15 15 14 19   ALKPHOS 71 68 72 81  BILITOT 1.2 1.2 1.3* 1.4*  PROT 7.2 6.8 6.9 6.6  ALBUMIN  3.8 3.5 3.5 3.5   No results for input(s): LIPASE, AMYLASE in the last 168 hours. Recent Labs  Lab  07/01/24 1726  AMMONIA 15    CBC: Recent Labs  Lab 06/28/24 0226 07/01/24 1336 07/02/24 0526 07/02/24 1646 07/03/24 0543  WBC 6.2 12.0* 12.2* 14.2* 14.7*  NEUTROABS  --  10.4*  --   --   --   HGB 11.3* 12.7 12.3 12.2 12.4  HCT 35.1* 38.7 37.0 37.3 37.9  MCV 98.9 97.0 95.9 96.4 96.2  PLT 101* 82* 68* 70* 63*     HbA1C: No results found for: HGBA1C  Urinalysis: No results for input(s): COLORURINE, LABSPEC, PHURINE, GLUCOSEU, HGBUR, BILIRUBINUR, KETONESUR, PROTEINUR, UROBILINOGEN, NITRITE, LEUKOCYTESUR in the last 72 hours.  Invalid input(s): APPERANCEUR    Imaging: DG Abd 1 View Result Date: 07/02/2024 EXAM: 1 or 2 VIEW XRAY OF THE ABDOMEN 07/02/2024 09:53:25 PM COMPARISON: None available. CLINICAL HISTORY: Encounter for orogastric (OG) tube placement FINDINGS: LINES, TUBES AND DEVICES: Enteric tube tip is in the body of the stomach. The distal 5 cm of the tube is folded upon itself/kinked. Vascular stent in place overlying the expected region of the left common iliac vein. Cardiac paddles noted overlying the left hemiabdomen. BOWEL: Paucity of bowel gas. SOFT TISSUES: Vascular calcifications are seen in the abdomen and pelvis. BONES: No acute fracture. IMPRESSION: 1. Enteric tube tip in the body of the  stomach, with the distal 5 cm folded upon itself and kinked. Electronically signed by: Greig Pique MD 07/02/2024 10:02 PM EST RP Workstation: [id number]   CT CERVICAL SPINE WO CONTRAST Result Date: 07/02/2024 EXAM: CT CERVICAL SPINE WITHOUT CONTRAST 07/02/2024 02:47:57 PM TECHNIQUE: CT of the cervical spine was performed without the administration of intravenous contrast. Multiplanar reformatted images are provided for review. Automated exposure control, iterative reconstruction, and/or weight based adjustment of the mA/kV was utilized to reduce the radiation dose to as low as reasonably achievable. COMPARISON: None available. CLINICAL HISTORY: Acute severe  posterior neck pain. FINDINGS: BONES AND ALIGNMENT: Interval collapse of the superior endplate of C6 and inferior endplate of C5 and exaggerated kyphosis may reflect marked progression of degenerative change. Sequelae of infection could have a similar appearance. Slight retrolisthesis at C5-C6, C6-C7 and C7-T1 are similar to the prior exam. DEGENERATIVE CHANGES: Ankylosis is present across the disc space anteriorly. Moderate irregular endplate changes are present at C6-C7 without loss of height. Mild endplate changes are present at C4-C5 without loss of height. The endplate collapse at C5-C6 is also a degenerative change. SOFT TISSUES: No prevertebral soft tissue swelling. Atherosclerotic changes are present in the proximal great vessels and at the carotid bifurcations bilaterally without definite stenosis. IMPRESSION: 1. Interval collapse of the superior endplate of C6 and inferior endplate of C5 with exaggerated kyphosis, which may reflect marked progression of degenerative change or sequelae of infection. MRI of the cervical spine without and with contrast may be useful for further evaluation. 2. Progressive degenerative endplate changes at C4-5 and C6-7 Electronically signed by: Lonni Necessary MD 07/02/2024 04:00 PM EST RP Workstation: [id number]   US  ASCITES (ABDOMEN LIMITED) Result Date: 07/02/2024 EXAM: LIMITED ABDOMINAL ULTRASOUND FOR ASCITES EVALUATION TECHNIQUE: Limited real-time sonography of all 4 quadrants of the abdomen was performed for evaluation of ascites. COMPARISON: US  Abdomen Limited 03/14/2024; 04/30/2023. CLINICAL HISTORY: Cirrhosis (HCC). FINDINGS: RIGHT UPPER QUADRANT: Moderate volume of ascites noted. LEFT UPPER QUADRANT: Moderate volume of ascites noted. RIGHT LOWER QUADRANT: Moderate volume of ascites noted. LEFT LOWER QUADRANT: Minimal fluid in the left lower quadrant. OTHER: Moderate fluid midline. Limited visualization of the rest of the abdomen demonstrates no acute  abnormality. IMPRESSION: 1. Moderate volume of ascites in the right upper quadrant, right lower quadrant, left upper quadrant, and midline. 2.  minimal fluid in the left lower quadrant. Electronically signed by: Norleen Boxer MD 07/02/2024 03:47 PM EST RP Workstation: [id number]   CT Cervical Spine Wo Contrast Result Date: 07/01/2024 EXAM: CT CERVICAL SPINE WITHOUT CONTRAST 07/01/2024 03:13:30 PM TECHNIQUE: CT of the cervical spine was performed without the administration of intravenous contrast. Multiplanar reformatted images are provided for review. Automated exposure control, iterative reconstruction, and/or weight based adjustment of the mA/kV was utilized to reduce the radiation dose to as low as reasonably achievable. COMPARISON: 06/22/2024 and 02/16/2022 CLINICAL HISTORY: Ataxia, cervical trauma FINDINGS: BONES AND ALIGNMENT: No acute fracture or traumatic malalignment. Focal kyphosis at C5-C6 is stable. Straightening of the normal cervical lordosis otherwise is stable. DEGENERATIVE CHANGES: Chronic endplate changes at C5-C6. SOFT TISSUES: Adjacent soft tissues are unremarkable. Calcifications are again seen within the thyroid  bilaterally. Atherosclerotic calcifications are present in the proximal great vessels and at the carotid bifurcations without definite stenosis. A right IJ catheter is in place. No prevertebral soft tissue swelling. IMPRESSION: 1. No acute cervical spine findings. 2. Stable chronic endplate changes and focal kyphosis at C5-6. Electronically signed by: Lonni Necessary MD 07/01/2024 03:57 PM EST RP Workstation: [id number]  CT HEAD WO CONTRAST ( ) Result Date: 07/01/2024 EXAM: CT HEAD WITHOUT CONTRAST 07/01/2024 03:13:30 PM TECHNIQUE: CT of the head was performed without the administration of intravenous contrast. Automated exposure control, iterative reconstruction, and/or weight based adjustment of the mA/kV was utilized to reduce the radiation dose to as low as reasonably  achievable. COMPARISON: 06/22/2024 CLINICAL HISTORY: Head trauma, abnormal mental status (Age 54-64y). Fall today. FINDINGS: BRAIN AND VENTRICLES: No acute hemorrhage. No evidence of acute infarct. Chronic encephalomalacia within bilateral frontal, parietal, and right occipital lobes compatible with remote infarcts. Extensive periventricular and subcortical white matter low-density changes compatible with chronic microvascular ischemic change. No hydrocephalus. No extra-axial collection. No mass effect or midline shift. ORBITS: No acute abnormality. SINUSES: No acute abnormality. SOFT TISSUES AND SKULL: No acute soft tissue abnormality. No skull fracture. Atherosclerotic calcifications in large vessels of skull base. IMPRESSION: 1. No acute intracranial abnormality. 2. Chronic encephalomalacia within bilateral frontal, parietal, and right occipital lobes compatible with remote infarcts. 3. Extensive periventricular and subcortical white matter low-density changes compatible with chronic microvascular ischemic change. Electronically signed by: Lonni Necessary MD 07/01/2024 03:54 PM EST RP Workstation: [id number]   DG Wrist Complete Right Result Date: 07/01/2024 EXAM: 3 or more VIEW(S) XRAY OF THE RIGHT WRIST 07/01/2024 12:22:33 PM COMPARISON: None available. CLINICAL HISTORY: fall FINDINGS: BONES AND JOINTS: No acute fracture. No malalignment. Subchondral cysts in the base of the first metacarpal bone with degenerative changes of the first [facility] [employer] joint. SOFT TISSUES: Unremarkable. IMPRESSION: 1. No acute fracture or dislocation. Electronically signed by: Waddell Calk MD 07/01/2024 12:51 PM EST RP Workstation: [id number]   DG Pelvis Portable Result Date: 07/01/2024 EXAM: 1 or 2 VIEW(S) XRAY OF THE PELVIS 07/01/2024 12:22:33 PM COMPARISON: 01/13/2016 CLINICAL HISTORY: fall FINDINGS: BONES AND JOINTS: No acute fracture. No malalignment. SOFT TISSUES: Likely calcified uterine fibroids. Severe atherosclerotic  plaque. IMPRESSION: 1. No evidence of acute traumatic injury. Electronically signed by: Waddell Calk MD 07/01/2024 12:49 PM EST RP Workstation: [id number]   DG Chest Portable 1 View Result Date: 07/01/2024 EXAM: 1 VIEW(S) XRAY OF THE CHEST 07/01/2024 11:45:00 AM COMPARISON: 06/25/2024 CLINICAL HISTORY: SOB FINDINGS: LINES, TUBES AND DEVICES: Right chest wall dialysis catheter in place with tip overlying right atrium. Left brachial stent noted. LUNGS AND PLEURA: Pulmonary interstitial prominence with mild pulmonary edema. Decreased left pleural effusion. No pneumothorax. HEART AND MEDIASTINUM: Cardiomegaly. Coronary artery calcifications and stents. Surgical changes in mediastinum. Aortic calcification. BONES AND SOFT TISSUES: Intact sternotomy wires. IMPRESSION: 1. Pulmonary interstitial prominence with mild pulmonary edema, similar . 2. Decreased left pleural effusion. 3. Cardiomegaly. Electronically signed by: Waddell Calk MD 07/01/2024 11:55 AM EST RP Workstation: [id number]     Medications:    amiodarone  30 mg/hr (07/03/24 0911)   amiodarone      doxycycline  (VIBRAMYCIN ) IV Stopped (07/03/24 0741)   heparin  950 Units/hr (07/03/24 0813)   norepinephrine  (LEVOPHED ) Adult infusion 2 mcg/min (07/03/24 9167)   propofol  (DIPRIVAN ) infusion 30 mcg/kg/min (07/03/24 0813)   vasopressin  0.03 Units/min (07/03/24 0813)    Chlorhexidine  Gluconate Cloth  6 each Topical Q0600   collagenase   1 Application Topical Daily   levothyroxine   75 mcg Oral Q0600   midodrine   10 mg Oral TID WC   mouth rinse  15 mL Mouth Rinse Q2H   pantoprazole   40 mg Oral BH-q7a   sodium chloride  flush  10-40 mL Intracatheter Q12H   venlafaxine   37.5 mg Oral BID WC    Assessment/ Plan:     64 year old female with history of  COPD, atrial fibrillation, bipolar disorder, end-stage renal disease on hemodialysis Monday Wednesday and Friday now admitted with history of fall.  Cardiac arrest on Sunday, 07/02/2024   ESRD: HD  Monday Wednesday and Friday.  Will schedule for dialysis today.  Multiple pressors.  No volume removal.  ANEMIA of chronic kidney disease: Hemoglobin acceptable.  Will continue to monitor closely.   MBD: Will check PTH, calcium  and phosphorus levels.  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  Will continue sodium thiosulfate  when she stabilizes.   Hypotension/VOL: Currently hypotensive and requiring pressors.  If she does not tolerate intermittent hemodialysis will need to be changed to CRRT.  Acute respiratory failure-requiring ventilator postcardiac arrest on 07/02/2024.    Labs and medications reviewed. Will continue to follow along with you.   LOS: 1 Saralee Stank, MD [facility] 1/19/202610:20 AM  "

## 2024-07-03 NOTE — Progress Notes (Signed)
 Pt awake and following commands. Nods head appropriately to questions. Pt is restless and keeps reaching up for ETT. Brother at bedside. Pt receiving HD at this time. Orders received to restart propofol  gtt until HD is complete.

## 2024-07-03 NOTE — Progress Notes (Signed)
" °   07/03/24 1656  Vitals  Temp 98.3 F (36.8 C)  Temp Source Axillary  BP 124/73  MAP (mmHg) 89  BP Location Right Arm  BP Method Automatic  Patient Position (if appropriate) Lying  Pulse Rate 75  Pulse Rate Source Monitor  ECG Heart Rate 76  Resp 14  Oxygen  Therapy  SpO2 97 %  O2 Device Ventilator  O2 Flow Rate (L/min) 4 L/min  Patient Activity (if Appropriate) In bed  Pulse Oximetry Type Continuous  Oximetry Probe Site Changed No  Hemodialysis Catheter Right Internal jugular Double lumen Permanent (Tunneled)  Placement Date/Time: 04/20/24 0833   Placed prior to admission: Yes  Serial / Lot #: [id number]  Expiration Date: 06/22/26  Time Out: Correct patient;Correct site;Correct procedure  Maximum sterile barrier precautions: Hand hygiene;Cap;Mask;Sterile go...  Site Condition No complications  Blue Lumen Status Flushed;Heparin  locked;Dead end cap in place  Red Lumen Status Flushed;Heparin  locked;Dead end cap in place  Purple Lumen Status N/A  Catheter fill solution Heparin  1000 units/ml  Catheter fill volume (Arterial) 2 cc  Catheter fill volume (Venous) 2.2  Dressing Type Transparent  Dressing Status Antimicrobial disc/dressing in place;Clean, Dry, Intact  Drainage Description None  Dressing Change Due 07/09/24  Post treatment catheter status Capped and Clamped   Pt tolerated tx well. Order to just clean blood today. Bp decreased. Pt was on vent. And was put on sedation bec she was trying to pull tubing out. Notifed MD Dennise. Report given to icu nurse.  "

## 2024-07-03 NOTE — Progress Notes (Signed)
 PHARMACY - ANTICOAGULATION CONSULT NOTE  Pharmacy Consult for UFH infusion Indication: atrial fibrillation  Allergies[1]  Patient Measurements: Height: 5' 6 (167.6 cm) Weight: 67 kg (147 lb 11.3 oz) IBW/kg (Calculated) : 59.3 HEPARIN  DW (KG): 70.5  Vital Signs: Temp: 97.8 F (36.6 C) (01/19 0000) Temp Source: Axillary (01/19 0000) BP: 113/68 (01/19 0000) Pulse Rate: 81 (01/19 0000)  Labs: Recent Labs    07/01/24 1336 07/01/24 2155 07/02/24 0526 07/02/24 1646  HGB 12.7  --  12.3 12.2  HCT 38.7  --  37.0 37.3  PLT 82*  --  68* 70*  CREATININE 5.07* 5.43* 5.79* 3.57*    Estimated Creatinine Clearance: 15.5 mL/min (A) (by C-G formula based on SCr of 3.57 mg/dL (H)).   Medical History: Past Medical History:  Diagnosis Date   Acute lacunar infarction (HCC) 03/03/2014   a.) MRI brain 03/03/2014: two acute punctate lacunar infarcts anteriorly in the BILATERAL centrum semiovale   Adult behavior problems    Anemia of chronic renal failure    Aortic atherosclerosis    Arthritis    Atrial fibrillation and flutter (HCC)    a.) CHA2DS2VASc = 6 (sex, HFrEF, HTN, CVA x 2, prior MI/vascular disease) as of 02/04/2024; b.) rate/rhythm maintained on oral metoprolol  succinate; chronically anticoagulated with apixaban    Bipolar disorder (HCC)    Cardiomegaly    Cerebral microvascular disease    CHF (congestive heart failure) (HCC)    Chicken pox    Cholelithiasis    Chronic respiratory failure with hypoxia (HCC) 12/19/2021   COPD (chronic obstructive pulmonary disease) (HCC)    Coronary artery disease    DDD (degenerative disc disease), cervical    DDD (degenerative disc disease), thoracolumbar    Diverticulosis    Dyspnea    ESRD (end stage renal disease) on dialysis (M,W,F)    GERD (gastroesophageal reflux disease)    Gout    HCV (hepatitis C virus)    Headache    HFrEF (heart failure with reduced ejection fraction) (HCC)    History of delirium 04/12/2014   History of  substance abuse (HCC)    a.) tobacco + cocaine   Hyperkalemia 11/13/2014   Hyperlipidemia    Hypertension    Hypotension 02/24/2014   Hypothyroidism    Ischemic cardiomyopathy    Nose colonized with MRSA 02/01/2024   a.) presurgical PCR (+) on 02/01/2024 prior to A/V FISTULAGRAM; b.) presurgical PCR (+) on 03/07/2024 prior to AV FISTULA CREATION   NSTEMI (non-ST elevated myocardial infarction) (HCC) 10/08/2013   NSTEMI (non-ST elevated myocardial infarction) (HCC) 01/23/2002   a.) LHC 01/24/2002 --> multi-vessel CAD with IRA being 90% OM2 --> delayed PCI until 01/25/2002 --> 3.0 x 23 mm BX Velocity stent   Obesity    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    On apixaban  therapy    Peripheral vascular disease    Pneumonia    Postoperative anemia due to acute blood loss 02/27/2014   Postoperative cerebrovascular infarction following cardiac surgery 02/21/2014   a.) developed RIGHT upper/lower extremity weakness following cardiac revascularization --> neuro consult and head CT --> Hypodensity noted in the area of the LEFT motor cortex consistent with a component of acute ischemia   Pulmonary hypertension (HCC)    RBBB (right bundle branch block)    Renal osteodystrophy    S/P CABG x 3 02/20/2014   a.) LIMA-LAD, SVG-OM1, SVG-PDA   Stroke (HCC) 05/23/2010   a.) brain MRI 05/23/2010: BILATERAL acute infarcts and old prior infarcts;  RIGHT frontal lobe extending into the white matter. Small focus of restricted diffusion in the cortex of the medial LEFT frontoparietal lobe. Periventricular T2 and multiple foci of T2 hyperintensity in the subcortical white matter and increased FLAIR signal in the cortex of the LEFT frontal lobe.   Syncope and collapse 07/25/2020    Medications:  Scheduled:   Chlorhexidine  Gluconate Cloth  6 each Topical Q0600   collagenase   1 Application Topical Daily   digoxin        levothyroxine   75 mcg Oral Q0600   midodrine   10 mg Oral TID WC   mouth rinse   15 mL Mouth Rinse Q2H   pantoprazole   40 mg Oral BH-q7a   sodium chloride  flush  10-40 mL Intracatheter Q12H   venlafaxine   37.5 mg Oral BID WC    Assessment: 64 y/o F with a h/o atrial fibrillation on apixaban . Last dose was today at 0900. Patient coded on 2024/07/28 with subsequent ROSC and is now in the ICU on ventilation and pressor support. Pharmacy consulted for heparin  bridging.   0119 0543 aPTT 102, HL >1.1, therapeutic but at higher end of goal and HL not yet correlating   Goal of Therapy:  Heparin  level 0.3-0.7 units/ml aPTT 66-102 seconds Monitor platelets by anticoagulation protocol: Yes   Plan:  aPTT level therapeutic but at higher end of goal and HL not yet correlating  Reduce heparin  infusion to 950 units/hr Check aPTT 8 hours after starting infusion Transition to anti-Xa levels when able Daily anti-Xa/CBC  Lum VEAR Mania, PharmD, BCPS 07/03/2024,2:16 AM       [1]  Allergies Allergen Reactions   Morphine  And Codeine Hives   Levaquin [Levofloxacin] Itching and Other (See Comments)    Severe itching; prickly sensation; severe left leg pain; immobility.   Enalapril  Other (See Comments)    hallucinations   Latex Rash   Tape Rash and Other (See Comments)

## 2024-07-03 NOTE — Progress Notes (Signed)
 Levophed  gtt restarted for BP 79/54 (63). HD RN aware.

## 2024-07-03 NOTE — Progress Notes (Signed)
 Initial Nutrition Assessment  DOCUMENTATION CODES:   Not applicable  INTERVENTION:   Vital 1.2@55ml /hr- Initiate at 63ml/hr and increase by 10ml/hr q 8 hours until goal rate.   ProSource TF 20- Give 60ml daily via tube, each supplement provides 80kcal and 20g of protein.   Free water  flushes 30ml q4 hours to maintain tube patency   Regimen provides 1664kcal/day, 119g/day protein and 1262ml/day of free water .   Rena-vit daily via tube   Juven Fruit Punch BID, each serving provides 95kcal and 2.5g of protein (amino acids glutamine and arginine)  Pt at high refeed risk; recommend monitor potassium, magnesium  and phosphorus labs daily until stable  Daily weights   NUTRITION DIAGNOSIS:   Inadequate oral intake related to inability to eat (pt sedated and ventilated) as evidenced by NPO status.  GOAL:   Provide needs based on ASPEN/SCCM guidelines  MONITOR:   Vent status, Labs, Weight trends, TF tolerance, Skin, I & O's  REASON FOR ASSESSMENT:   Ventilator    ASSESSMENT:   64 y/o female with h/o ESRD on HD, CAD s/p CABG, ischemic cardiomyopathy, dementia, CVA, PVD, PAF, COPD, HLD, hypothyroid disease, CHF, HCV, cirrhosis, HTN,  DDD, chronic pain, GERD, bipolar disorder, gout, diverticulosis and pulmonary hypertension who is admitted with cardiac arrest.  Pt sedated and ventilated. OGT in place but kinked on itself. Will plan to initiate tube feeds today. Pt is at high refeed risk. Plan is for HD vs CRRT initiation. Per chart, pt is down 18lbs(11%) over the past 6 weeks; this is significant weight loss.    Medications reviewed and include: synthroid , midodrine , protonix , doxycycline , heparin , vasopressin    Labs reviewed: Na 134(L), K 4.5 wnl, BUN 26(H), creat 4.09(H) Pro BNP- >35,000(H)- 1/18 Wbc- 14.7(H) Cbgs- 141, 153 x 24 hrs   Patient is currently intubated on ventilator support MV: 7.0 L/min Temp (24hrs), Avg:98.4 F (36.9 C), Min:97.8 F (36.6 C), Max:99.8 F  (37.7 C)  Propofol : stopped   MAP >69mmHg   NUTRITION - FOCUSED PHYSICAL EXAM:  Flowsheet Row Most Recent Value  Orbital Region No depletion  Upper Arm Region No depletion  Thoracic and Lumbar Region No depletion  Buccal Region No depletion  Temple Region No depletion  Clavicle Bone Region No depletion  Clavicle and Acromion Bone Region No depletion  Scapular Bone Region No depletion  Dorsal Hand No depletion  Patellar Region No depletion  Anterior Thigh Region No depletion  Posterior Calf Region No depletion  Edema (RD Assessment) Mild  Hair Reviewed  Eyes Reviewed  Mouth Reviewed  Skin Reviewed  Nails Reviewed   Diet Order:   Diet Order     None      EDUCATION NEEDS:   No education needs have been identified at this time  Skin:  Skin Assessment: Reviewed RN Assessment (ulcers L foot and heel)  Last BM:  pta  Height:   Ht Readings from Last 1 Encounters:  07/01/24 5' 6 (1.676 m)    Weight:   Wt Readings from Last 1 Encounters:  07/02/24 67 kg    Ideal Body Weight:  59 kg  BMI:  Body mass index is 23.84 kg/m.  Estimated Nutritional Needs:   Kcal:  1535kcal/day  Protein:  105-120g/day  Fluid:  UOP +1L  Augustin Shams MS, RD, LDN If unable to be reached, please send secure chat to RD inpatient available from 8:00a-4:00p daily

## 2024-07-03 NOTE — Plan of Care (Signed)

## 2024-07-04 ENCOUNTER — Inpatient Hospital Stay

## 2024-07-04 ENCOUNTER — Ambulatory Visit: Admitting: Cardiovascular Disease

## 2024-07-04 LAB — RENAL FUNCTION PANEL
Albumin: 3.1 g/dL — ABNORMAL LOW (ref 3.5–5.0)
Anion gap: 17 — ABNORMAL HIGH (ref 5–15)
BUN: 21 mg/dL (ref 8–23)
CO2: 25 mmol/L (ref 22–32)
Calcium: 9.8 mg/dL (ref 8.9–10.3)
Chloride: 92 mmol/L — ABNORMAL LOW (ref 98–111)
Creatinine, Ser: 3.18 mg/dL — ABNORMAL HIGH (ref 0.44–1.00)
GFR, Estimated: 16 mL/min — ABNORMAL LOW
Glucose, Bld: 115 mg/dL — ABNORMAL HIGH (ref 70–99)
Phosphorus: 2.7 mg/dL (ref 2.5–4.6)
Potassium: 3.9 mmol/L (ref 3.5–5.1)
Sodium: 134 mmol/L — ABNORMAL LOW (ref 135–145)

## 2024-07-04 LAB — GLUCOSE, CAPILLARY
Glucose-Capillary: 100 mg/dL — ABNORMAL HIGH (ref 70–99)
Glucose-Capillary: 101 mg/dL — ABNORMAL HIGH (ref 70–99)
Glucose-Capillary: 121 mg/dL — ABNORMAL HIGH (ref 70–99)
Glucose-Capillary: 121 mg/dL — ABNORMAL HIGH (ref 70–99)
Glucose-Capillary: 123 mg/dL — ABNORMAL HIGH (ref 70–99)
Glucose-Capillary: 143 mg/dL — ABNORMAL HIGH (ref 70–99)
Glucose-Capillary: 87 mg/dL (ref 70–99)

## 2024-07-04 LAB — MAGNESIUM
Magnesium: 1.9 mg/dL (ref 1.7–2.4)
Magnesium: 1.9 mg/dL (ref 1.7–2.4)

## 2024-07-04 LAB — TROPONIN T, HIGH SENSITIVITY
Troponin T High Sensitivity: 709 ng/L (ref 0–19)
Troponin T High Sensitivity: 748 ng/L (ref 0–19)
Troponin T High Sensitivity: 803 ng/L (ref 0–19)
Troponin T High Sensitivity: 821 ng/L (ref 0–19)

## 2024-07-04 LAB — CBC
HCT: 35.8 % — ABNORMAL LOW (ref 36.0–46.0)
Hemoglobin: 12.1 g/dL (ref 12.0–15.0)
MCH: 31.4 pg (ref 26.0–34.0)
MCHC: 33.8 g/dL (ref 30.0–36.0)
MCV: 93 fL (ref 80.0–100.0)
Platelets: 73 K/uL — ABNORMAL LOW (ref 150–400)
RBC: 3.85 MIL/uL — ABNORMAL LOW (ref 3.87–5.11)
RDW: 17.8 % — ABNORMAL HIGH (ref 11.5–15.5)
WBC: 13.9 K/uL — ABNORMAL HIGH (ref 4.0–10.5)
nRBC: 0.1 % (ref 0.0–0.2)

## 2024-07-04 LAB — POTASSIUM: Potassium: 3.9 mmol/L (ref 3.5–5.1)

## 2024-07-04 LAB — APTT
aPTT: 53 s — ABNORMAL HIGH (ref 24–36)
aPTT: 63 s — ABNORMAL HIGH (ref 24–36)

## 2024-07-04 LAB — HEPARIN LEVEL (UNFRACTIONATED): Heparin Unfractionated: >1.1 [IU]/mL — ABNORMAL HIGH (ref 0.30–0.70)

## 2024-07-04 LAB — HEPATITIS B SURFACE ANTIBODY, QUANTITATIVE: Hep B S AB Quant (Post): <3.5 m[IU]/mL — ABNORMAL LOW

## 2024-07-04 MED ORDER — HEPARIN BOLUS VIA INFUSION
1050.0000 [IU] | Freq: Once | INTRAVENOUS | Status: AC
  Administered 2024-07-04: 1050 [IU] via INTRAVENOUS
  Filled 2024-07-04: qty 1050

## 2024-07-04 MED ORDER — PANTOPRAZOLE SODIUM 40 MG IV SOLR
40.0000 mg | Freq: Every day | INTRAVENOUS | Status: DC
  Administered 2024-07-04 – 2024-07-10 (×7): 40 mg via INTRAVENOUS
  Filled 2024-07-04 (×7): qty 10

## 2024-07-04 MED ORDER — HEPARIN BOLUS VIA INFUSION
2100.0000 [IU] | Freq: Once | INTRAVENOUS | Status: AC
  Administered 2024-07-04: 2100 [IU] via INTRAVENOUS
  Filled 2024-07-04: qty 2100

## 2024-07-04 MED ORDER — MAGNESIUM SULFATE 2 GM/50ML IV SOLN
2.0000 g | Freq: Once | INTRAVENOUS | Status: AC
  Administered 2024-07-04: 2 g via INTRAVENOUS
  Filled 2024-07-04: qty 50

## 2024-07-04 MED ORDER — LIDOCAINE IN D5W 4-5 MG/ML-% IV SOLN
1.0000 mg/min | INTRAVENOUS | Status: AC
  Administered 2024-07-04: 1 mg/min via INTRAVENOUS
  Filled 2024-07-04: qty 500

## 2024-07-04 NOTE — Progress Notes (Signed)
 PHARMACY - ANTICOAGULATION CONSULT NOTE  Pharmacy Consult for UFH infusion Indication: atrial fibrillation  Allergies[1]  Patient Measurements: Height: 5' 6 (167.6 cm) Weight: 74.7 kg (164 lb 10.9 oz) IBW/kg (Calculated) : 59.3 HEPARIN  DW (KG): 70.5  Vital Signs: Temp: 98.6 F (37 C) (01/20 0400) Temp Source: Axillary (01/20 0400) BP: 117/54 (01/20 0500) Pulse Rate: 70 (01/20 0500)  Labs: Recent Labs    07/02/24 1646 07/03/24 0543 07/03/24 1525 07/04/24 0441  HGB 12.2 12.4  --  12.1  HCT 37.3 37.9  --  35.8*  PLT 70* 63*  --  73*  APTT  --  102* 69* 53*  HEPARINUNFRC  --  >1.10*  --  >1.10*  CREATININE 3.57* 4.09*  --  3.18*    Estimated Creatinine Clearance: 19.2 mL/min (A) (by C-G formula based on SCr of 3.18 mg/dL (H)).   Medical History: Past Medical History:  Diagnosis Date   Acute lacunar infarction (HCC) 03/03/2014   a.) MRI brain 03/03/2014: two acute punctate lacunar infarcts anteriorly in the BILATERAL centrum semiovale   Adult behavior problems    Anemia of chronic renal failure    Aortic atherosclerosis    Arthritis    Atrial fibrillation and flutter (HCC)    a.) CHA2DS2VASc = 6 (sex, HFrEF, HTN, CVA x 2, prior MI/vascular disease) as of 02/04/2024; b.) rate/rhythm maintained on oral metoprolol  succinate; chronically anticoagulated with apixaban    Bipolar disorder (HCC)    Cardiomegaly    Cerebral microvascular disease    CHF (congestive heart failure) (HCC)    Chicken pox    Cholelithiasis    Chronic respiratory failure with hypoxia (HCC) 12/19/2021   COPD (chronic obstructive pulmonary disease) (HCC)    Coronary artery disease    DDD (degenerative disc disease), cervical    DDD (degenerative disc disease), thoracolumbar    Diverticulosis    Dyspnea    ESRD (end stage renal disease) on dialysis (M,W,F)    GERD (gastroesophageal reflux disease)    Gout    HCV (hepatitis C virus)    Headache    HFrEF (heart failure with reduced ejection  fraction) (HCC)    History of delirium 04/12/2014   History of substance abuse (HCC)    a.) tobacco + cocaine   Hyperkalemia 11/13/2014   Hyperlipidemia    Hypertension    Hypotension 02/24/2014   Hypothyroidism    Ischemic cardiomyopathy    Nose colonized with MRSA 02/01/2024   a.) presurgical PCR (+) on 02/01/2024 prior to A/V FISTULAGRAM; b.) presurgical PCR (+) on 03/07/2024 prior to AV FISTULA CREATION   NSTEMI (non-ST elevated myocardial infarction) (HCC) 10/08/2013   NSTEMI (non-ST elevated myocardial infarction) (HCC) 01/23/2002   a.) LHC 01/24/2002 --> multi-vessel CAD with IRA being 90% OM2 --> delayed PCI until 01/25/2002 --> 3.0 x 23 mm BX Velocity stent   Obesity    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    On apixaban  therapy    Peripheral vascular disease    Pneumonia    Postoperative anemia due to acute blood loss 02/27/2014   Postoperative cerebrovascular infarction following cardiac surgery 02/21/2014   a.) developed RIGHT upper/lower extremity weakness following cardiac revascularization --> neuro consult and head CT --> Hypodensity noted in the area of the LEFT motor cortex consistent with a component of acute ischemia   Pulmonary hypertension (HCC)    RBBB (right bundle branch block)    Renal osteodystrophy    S/P CABG x 3 02/20/2014   a.) LIMA-LAD,  SVG-OM1, SVG-PDA   Stroke (HCC) 05/23/2010   a.) brain MRI 05/23/2010: BILATERAL acute infarcts and old prior infarcts; RIGHT frontal lobe extending into the white matter. Small focus of restricted diffusion in the cortex of the medial LEFT frontoparietal lobe. Periventricular T2 and multiple foci of T2 hyperintensity in the subcortical white matter and increased FLAIR signal in the cortex of the LEFT frontal lobe.   Syncope and collapse 07/25/2020    Medications:  Scheduled:   Chlorhexidine  Gluconate Cloth  6 each Topical Q0600   collagenase   1 Application Topical Daily   feeding supplement (PROSource  TF20)  60 mL Per Tube Daily   free water   30 mL Per Tube Q4H   levothyroxine   75 mcg Oral Q0600   midodrine   10 mg Oral TID WC   multivitamin  1 tablet Per Tube QHS   nutrition supplement (JUVEN)  1 packet Per Tube BID BM   mouth rinse  15 mL Mouth Rinse Q2H   pantoprazole   40 mg Oral BH-q7a   sodium chloride  flush  10-40 mL Intracatheter Q12H   venlafaxine   37.5 mg Oral BID WC    Assessment: 64 y/o F with a h/o atrial fibrillation on apixaban . Last dose was today at 0900. Patient coded on 07/12/24 with subsequent ROSC and is now in the ICU on ventilation and pressor support. Pharmacy consulted for heparin  bridging.   0119 0543 aPTT 102, HL >1.1, therapeutic but at higher end of goal and HL not yet correlating  0119 1525 aPTT 69, therapeutic x 2 0120 0441 aPTT 53, subtherapeutic / HL >1.1  Goal of Therapy:  Heparin  level 0.3-0.7 units/ml aPTT 66-102 seconds Monitor platelets by anticoagulation protocol: Yes   Plan: Bolus 2100 units x 1 Increase heparin  infusion to 1150 units/hr Recheck aPTT in 8 hrs after rate change Transition to anti-Xa levels when able Daily anti-Xa/CBC  Rankin CANDIE Dills, PharmD, [facility] 07/04/2024 5:22 AM           [1]  Allergies Allergen Reactions   Morphine  And Codeine Hives   Levaquin [Levofloxacin] Itching and Other (See Comments)    Severe itching; prickly sensation; severe left leg pain; immobility.   Enalapril  Other (See Comments)    hallucinations   Latex Rash   Tape Rash and Other (See Comments)

## 2024-07-04 NOTE — Progress Notes (Signed)
 "  NAME:  [name], MRN:  [id number], DOB:  1961-06-09, LOS: 2 ADMISSION DATE:  07/01/2024, CONSULTATION DATE:  07/02/24 REFERRING MD:  Dr. Dorothyann, CHIEF COMPLAINT:  syncope   History of Present Illness:  [name] is a 64 y.o. female with a pmh of of ESRD on HD (MWF), COPD, CHF, bipolar disorder, chronic hypotension on midodrine , HLD and prior CVA who presented to [facility] ED via EMS following syncopal episode.   History was gathered per chart review Per patient, her normal dialysis schedule is MWF.  Patient was recently admitted to the intensive care unit earlier this month July 03, 2024.  At that time she was treated for acute on chronic hypoxemia with circulatory shock since then she has been discharged home and while at home was found down by her family and was brought into the hospital.  After receiving medical management on the mat on the floor and dialysis patient had episode of pulselessness with wide complex tachyarrhythmia.  Rapid response was initiated and ACLS was started on patient.  She received 2 rounds of epinephrine  as well as shock with return of spontaneous circulation.  1 dose of IV amiodarone  was delivered and cardiology was contacted with initiation of amiodarone  infusion.  Patient was brought into the ICU on ventilator and intubated by EDP during CODE BLUE.  07/03/24- overnight, unresposive with AF. MWF(HD). Will meet with family. Will continue to monitor and re-evaluate post dialysis.  07/04/24- patient had few episodes of VT. She is DNR code status.  She was seen by cardiology, she has poor prognosis. No procedures are planned.   Pertinent Medical History  Hypotension & Hypertension HLD HFrEF ESRD on HD Stroke CAD s/p CABG x 3 RBBB Pulmonary hypertension PVD Ischemic cardiomyopathy Hyperkalemia Hypothyroidism Atrial fibrillation on eliquis  Anemia of chronic kidney disease   Significant Hospital Events: Including procedures, antibiotic start and  stop dates in addition to other pertinent events   07/02/24-transferred to ICU post CODE BLUE on medical floor status post ACLS with ROSC    Objective    Blood pressure 113/63, pulse 71, temperature 98.6 F (37 C), temperature source Axillary, resp. rate 14, height 5' 6 (1.676 m), weight 74.7 kg, SpO2 96%.    Vent Mode: PRVC FiO2 (%):  [28 %-35 %] 28 % Set Rate:  [16 bmp] 16 bmp Vt Set:  [450 mL] 450 mL PEEP:  [5 cmH20] 5 cmH20 Plateau Pressure:  [18 cmH20] 18 cmH20   Intake/Output Summary (Last 24 hours) at 07/04/2024 0844 Last data filed at 07/04/2024 0725 Gross per 24 hour  Intake 1984.44 ml  Output 300 ml  Net 1684.44 ml   Filed Weights   07/03/24 1315 07/03/24 1656 07/04/24 0400  Weight: 67.3 kg 67.3 kg 74.7 kg    Examination: General: Adult female, intubated unresponseve HENT: atraumatic, normocephalic, neck supple  Cardiovascular: ventricular rhythm, S1 S2, no r/m/g Pulm: Regular, non labored on [place] , breath sounds equal throughout GI: soft, non-tender, non-distended, no rebound/guarding, bs x 4 Extremities: warm/dry, pulses + 2 R/P, no edema noted Neuro: GCS4T GU: deferred   Assessment and Plan   # Cardiac arrest Hx: BiVfailure, CAD s/p CABG, Ischemic Cardiomyopathy, CVA       Wide-complex tachyarrhythmia status post ACLS with shock and return of spontaneous circulation.  Patient currently on mechanical ventilation status post endotracheal intubation.  Cardiology consultation placed-appreciate input Dr. Justin  infusion initiated        -hx of AF with biventricular failure        -  reviewed with cardiologist        - RBBB on EKG         - LR x 1 , digoxin  x 1        - s/p fem CVL on right - levophed  infusion  - Continuous cardiac monitoring - Vasopressors to maintain MAP goal >65 ~ currently on levophed  gtt - EKG prn - ECHO ordered #COPD - xopenex  #HFrEF #Atrial Fibrillation on Eliquis  #ESRD on HD (MWF)- s/p HD #GERD #Nausea and  Vomiting Hx: GERD, HCV, Diverticulosis - Monitor hepatic function as indication - Antiemetics prn - Constipation protocol prn - Diet: NPO except with meds #Chronic Thrombocytopenia #VTE prophylaxis - Trend CBC  - Transfuse if Hgb < 7 or platelets <10 - Monitor for s/sx of bleeding - Continue eliquis  5mg  BID  #Chronic Pain s/t Vascular Ulcers - percocet 5-325 mg x 1 - supportive care   Labs   CBC: Recent Labs  Lab 07/01/24 1336 07/02/24 0526 07/02/24 1646 07/03/24 0543 07/04/24 0441  WBC 12.0* 12.2* 14.2* 14.7* 13.9*  NEUTROABS 10.4*  --   --   --   --   HGB 12.7 12.3 12.2 12.4 12.1  HCT 38.7 37.0 37.3 37.9 35.8*  MCV 97.0 95.9 96.4 96.2 93.0  PLT 82* 68* 70* 63* 73*    Basic Metabolic Panel: Recent Labs  Lab 07/01/24 2155 07/02/24 0526 07/02/24 1646 07/03/24 0543 07/03/24 1847 07/04/24 0441 07/04/24 0727  NA 135 135 136 134*  --  134*  --   K 4.3 4.3 3.9 4.5 3.8 3.9 3.9  CL 93* 92* 90* 90*  --  92*  --   CO2 19* 23 23 23   --  25  --   GLUCOSE 90 106* 154* 169*  --  115*  --   BUN 32* 35* 18 26*  --  21  --   CREATININE 5.43* 5.79* 3.57* 4.09*  --  3.18*  --   CALCIUM  10.2 10.5* 10.3 10.2  --  9.8  --   MG 2.1  --   --   --  1.8 1.9 1.9  PHOS  --   --   --   --   --  2.7  --    GFR: Estimated Creatinine Clearance: 19.2 mL/min (A) (by C-G formula based on SCr of 3.18 mg/dL (H)). Recent Labs  Lab 06/27/24 1322 06/28/24 0226 07/01/24 1336 07/02/24 0526 07/02/24 1646 07/02/24 1647 07/02/24 2016 07/03/24 0543 07/04/24 0441  WBC  --    < > 12.0* 12.2* 14.2*  --   --  14.7* 13.9*  LATICACIDVEN 1.2  --  1.9  --   --  5.4* 3.7*  --   --    < > = values in this interval not displayed.    Liver Function Tests: Recent Labs  Lab 07/01/24 1336 07/01/24 2155 07/02/24 0526 07/02/24 1646 07/04/24 0441  AST 36 36 37 54*  --   ALT 15 15 14 19   --   ALKPHOS 71 68 72 81  --   BILITOT 1.2 1.2 1.3* 1.4*  --   PROT 7.2 6.8 6.9 6.6  --   ALBUMIN  3.8 3.5  3.5 3.5 3.1*   No results for input(s): LIPASE, AMYLASE in the last 168 hours. Recent Labs  Lab 07/01/24 1726  AMMONIA 15    ABG    Component Value Date/Time   PHART 7.5 (H) 07/02/2024 2058   PCO2ART 33 07/02/2024 2058   PO2ART  202 (H) 07/02/2024 2058   HCO3 25.7 07/02/2024 2058   TCO2 26 04/20/2024 0708   ACIDBASEDEF 0.2 07/01/2024 2155   O2SAT 99.2 07/02/2024 2058     Coagulation Profile: No results for input(s): INR, PROTIME in the last 168 hours.  Cardiac Enzymes: No results for input(s): CKTOTAL, CKMB, CKMBINDEX, TROPONINI in the last 168 hours.  HbA1C: No results found for: HGBA1C  CBG: Recent Labs  Lab 07/03/24 0831 07/03/24 1149 07/03/24 1609 07/04/24 0303 07/04/24 0746  GLUCAP 153* 141* 86 123* 121*    Review of Systems:    10 point review of systems unable to conduct due to acutely comatose state on mechanical ventilation  Past Medical History:  She,  has a past medical history of Acute lacunar infarction [facility]) (03/03/2014), Adult behavior problems, Anemia of chronic renal failure, Aortic atherosclerosis, Arthritis, Atrial fibrillation and flutter (HCC), Bipolar disorder (HCC), Cardiomegaly, Cerebral microvascular disease, CHF (congestive heart failure) (HCC), Chicken pox, Cholelithiasis, Chronic respiratory failure with hypoxia (HCC) (12/19/2021), COPD (chronic obstructive pulmonary disease) (HCC), Coronary artery disease, DDD (degenerative disc disease), cervical, DDD (degenerative disc disease), thoracolumbar, Diverticulosis, Dyspnea, ESRD (end stage renal disease) on dialysis (M,W,F), GERD (gastroesophageal reflux disease), Gout, HCV (hepatitis C virus), Headache, HFrEF (heart failure with reduced ejection fraction) (HCC), History of delirium (04/12/2014), History of substance abuse (HCC), Hyperkalemia (11/13/2014), Hyperlipidemia, Hypertension, Hypotension (02/24/2014), Hypothyroidism, Ischemic cardiomyopathy, Nose colonized with MRSA  (02/01/2024), NSTEMI (non-ST elevated myocardial infarction) (HCC) (10/08/2013), NSTEMI (non-ST elevated myocardial infarction) (HCC) (01/23/2002), Obesity, 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, On apixaban  therapy, Peripheral vascular disease, Pneumonia, Postoperative anemia due to acute blood loss (02/27/2014), Postoperative cerebrovascular infarction following cardiac surgery (02/21/2014), Pulmonary hypertension (HCC), RBBB (right bundle branch block), Renal osteodystrophy, S/P CABG x 3 (02/20/2014), Stroke (HCC) (05/23/2010), and Syncope and collapse (07/25/2020).   Surgical History:   Past Surgical History:  Procedure Laterality Date   A/V FISTULAGRAM Left 03/30/2019   Procedure: A/V FISTULAGRAM;  Surgeon: Marea Selinda RAMAN, MD;  Location: [facility];  Service: Cardiovascular;  Laterality: Left;   A/V FISTULAGRAM Left 10/06/2019   Procedure: A/V FISTULAGRAM;  Surgeon: Marea Selinda RAMAN, MD;  Location: [facility];  Service: Cardiovascular;  Laterality: Left;   A/V FISTULAGRAM Left 05/19/2021   Procedure: A/V FISTULAGRAM;  Surgeon: Marea Selinda RAMAN, MD;  Location: [facility];  Service: Cardiovascular;  Laterality: Left;   A/V FISTULAGRAM Left 01/29/2022   Procedure: A/V Fistulagram;  Surgeon: Marea Selinda RAMAN, MD;  Location: [facility];  Service: Cardiovascular;  Laterality: Left;   A/V FISTULAGRAM Left 04/30/2022   Procedure: A/V Fistulagram;  Surgeon: Marea Selinda RAMAN, MD;  Location: [facility];  Service: Cardiovascular;  Laterality: Left;   A/V FISTULAGRAM Left 08/20/2022   Procedure: A/V Fistulagram;  Surgeon: Marea Selinda RAMAN, MD;  Location: [facility];  Service: Cardiovascular;  Laterality: Left;   A/V FISTULAGRAM N/A 12/03/2022   Procedure: A/V Fistulagram;  Surgeon: Marea Selinda RAMAN, MD;  Location: [facility];  Service: Cardiovascular;  Laterality: N/A;   A/V FISTULAGRAM Left 02/11/2023   Procedure: A/V Fistulagram;  Surgeon: Marea Selinda RAMAN, MD;  Location: [facility];  Service: Cardiovascular;  Laterality: Left;   A/V FISTULAGRAM Left 02/07/2024   Procedure: A/V Fistulagram;  Surgeon: Marea Selinda RAMAN, MD;  Location: [facility];  Service: Cardiovascular;  Laterality: Left;   COLONOSCOPY     COLONOSCOPY WITH PROPOFOL  N/A 04/22/2021   Procedure:  COLONOSCOPY WITH PROPOFOL ;  Surgeon: Maryruth Ole DASEN, MD;  Location: [facility] ENDOSCOPY;  Service: Endoscopy;  Laterality: N/A;   CORONARY ANGIOPLASTY WITH STENT PLACEMENT Left 01/25/2002   Procedure: CORONARY ANGIOPLASTY WITH STENT PLACEMENT; Location: [facility]; Surgeon: Margie Lovelace, MD   CORONARY ARTERY BYPASS GRAFT N/A 02/20/2014   Procedure: CORONARY ARTERY BYPASS GRAFT; Location: [facility]; Surgeon: Reyes Fruits, MD   DIALYSIS FISTULA CREATION     DIALYSIS/PERMA CATHETER INSERTION N/A 04/08/2023   Procedure: DIALYSIS/PERMA CATHETER INSERTION;  Surgeon: Marea Selinda RAMAN, MD;  Location: [facility];  Service: Cardiovascular;  Laterality: N/A;   DIALYSIS/PERMA CATHETER REMOVAL N/A 07/06/2023   Procedure: DIALYSIS/PERMA CATHETER REMOVAL;  Surgeon: [name] Cordella MATSU, MD;  Location: [facility];  Service: Cardiovascular;  Laterality: N/A;   ESOPHAGOGASTRODUODENOSCOPY     ESOPHAGOGASTRODUODENOSCOPY N/A 04/22/2021   Procedure: ESOPHAGOGASTRODUODENOSCOPY (EGD);  Surgeon: Maryruth Ole DASEN, MD;  Location[facility]COPY;  Service: Endoscopy;  Laterality: N/A;   FLEXIBLE SIGMOIDOSCOPY N/A 02/23/2019   Procedure: FLEXIBLE SIGMOIDOSCOPY;  Surgeon: Gaylyn Gladis PENNER, MD;  Location: [facility] ENDOSCOPY;  Service: Endoscopy;  Laterality: N/A;   IABP INSERTION  02/20/2014   Procedure: INTRA-AORTIC BALLOON PUMP INSERTION; Location: [facility]; Surgeon: Reyes Fruits, MD   INSERTION OF ARTERIOVENOUS (AV) ARTEGRAFT ARM Right 03/09/2024   Procedure: INSERTION, GRAFT, ARTERIOVENOUS, UPPER EXTREMITY;  Surgeon: Marea Selinda RAMAN, MD;  Location: [facility];  Service: Vascular;  Laterality:  Right;  BRACHIAL AXILLARY   INSERTION OF DIALYSIS CATHETER N/A 04/20/2024   Procedure: INSERTION OF DIALYSIS CATHETER;  Surgeon: Marea Selinda RAMAN, MD;  Location: [facility];  Service: Vascular;  Laterality: N/A;   LEFT HEART CATH AND CORONARY ANGIOGRAPHY Left 01/24/2002   Procedure: LEFT HEART CATH AND CORONARY ANGIOGRAPHY; Location: [facility]; Surgeon: Margie Lovelace, MD   LEFT HEART CATH AND CORONARY ANGIOGRAPHY Left 05/25/2012   Procedure: LEFT HEART CATH AND CORONARY ANGIOGRAPHY; Location: [facility]; Surgeon: Margie Lovelace, MD   LEFT HEART CATH AND CORONARY ANGIOGRAPHY Left 10/09/2013   Procedure: LEFT HEART CATH AND CORONARY ANGIOGRAPHY; Location: [facility]; Surgeon: Vinie Jude, MD   LEFT HEART CATH AND CORS/GRAFTS ANGIOGRAPHY N/A 08/08/2018   Procedure: LEFT HEART CATH AND CORS/GRAFTS ANGIOGRAPHY;  Surgeon: Fernand Denyse LABOR, MD;  Location: [facility];  Service: Cardiovascular;  Laterality: N/A;   LEFT HEART CATH AND CORS/GRAFTS ANGIOGRAPHY N/A 01/30/2019   Procedure: LEFT HEART CATH AND CORS/GRAFTS ANGIOGRAPHY;  Surgeon: Fernand Denyse LABOR, MD;  Location: [facility];  Service: Cardiovascular;  Laterality: N/A;   LIGATION ARTERIOVENOUS GORTEX GRAFT Right 04/20/2024   Procedure: LIGATION ARTERIOVENOUS GORTEX GRAFT;  Surgeon: Marea Selinda RAMAN, MD;  Location: [facility];  Service: Vascular;  Laterality: Right;   LOWER EXTREMITY ANGIOGRAPHY Left 04/24/2024   Procedure: Lower Extremity Angiography;  Surgeon: Marea Selinda RAMAN, MD;  Location: [facility];  Service: Cardiovascular;  Laterality: Left;   ultrasound guided pericardiocentesis     UPPER EXTREMITY ANGIOGRAPHY Right 03/30/2024   Procedure: Upper Extremity Angiography;  Surgeon: Marea Selinda RAMAN, MD;  Location: [facility];  Service: Cardiovascular;  Laterality: Right;     Social History:   reports that she has been smoking cigarettes. She has a 5 pack-year smoking history. She has never used smokeless tobacco. She reports that she  does not drink alcohol and does not use drugs.   Family History:  Her family history includes Breast cancer in her maternal aunt and maternal grandmother; Cancer in an other family member; Diabetes type II in her maternal grandmother  and mother; Hypertension in her father, mother, sister, and another family member; Kidney disease in her father; Ovarian cancer in her mother; Renal Disease in an other family member.   Allergies Allergies[1]   Home Medications  Prior to Admission medications  Medication Sig Start Date End Date Taking? Authorizing Provider  albuterol  (VENTOLIN  HFA) 108 (90 Base) MCG/ACT inhaler Inhale 1-2 puffs into the lungs every 6 (six) hours as needed for wheezing or shortness of breath. 01/04/24   Orlean Alan HERO, FNP  apixaban  (ELIQUIS ) 5 MG TABS tablet Take 5 mg by mouth 2 (two) times daily. 09/11/19   [provider]  cephALEXin  (KEFLEX ) 500 MG capsule Take 1 capsule (500 mg total) by mouth 2 (two) times daily. 05/23/24   Fernand Fredy RAMAN, MD  colchicine  0.6 MG tablet Take 1 tablet (0.6 mg total) by mouth once for 1 dose. Patient not taking: Reported on 05/23/2024 04/13/24 05/19/24  Scoggins, [facility], NP  doxycycline  (VIBRA -TABS) 100 MG tablet Take 1 tablet (100 mg total) by mouth 2 (two) times daily. Patient not taking: Reported on 05/23/2024 05/09/24   Janit Thresa HERO, DPM  hydrOXYzine  (ATARAX /VISTARIL ) 50 MG tablet Take 50 mg by mouth at bedtime.    [provider]  levothyroxine  (SYNTHROID ) 75 MCG tablet Take 75 mcg by mouth daily before breakfast.  11/02/18   [provider]  lidocaine -prilocaine  (EMLA ) cream Apply 1 Application topically once. 01/22/21   [provider]  metoprolol  succinate (TOPROL -XL) 25 MG 24 hr tablet Take 1 tablet by mouth every morning. 08/27/23 08/26/24  [provider]  midodrine  (PROAMATINE ) 10 MG tablet Take 1 tablet (10 mg total) by mouth 3 (three) times daily. 02/17/24   Fernand Denyse LABOR, MD   oxyCODONE -acetaminophen  (PERCOCET) 5-325 MG tablet Take 1 tablet by mouth 2 (two) times daily as needed for severe pain (pain score 7-10). 05/23/24   Fernand Fredy RAMAN, MD  pantoprazole  (PROTONIX ) 40 MG tablet Take 40 mg by mouth every morning. 11/29/23   [provider]  ranolazine  (RANEXA ) 1000 MG SR tablet Take 1,000 mg by mouth 2 (two) times daily. 03/18/23   [provider]  rosuvastatin  (CRESTOR ) 20 MG tablet Take 20 mg by mouth at bedtime. 11/07/21   [provider]  sevelamer  carbonate (RENVELA ) 800 MG tablet Take 3,200 mg by mouth 3 (three) times daily with meals.    [provider]  triamcinolone  cream (KENALOG ) 0.1 % Apply 1 Application topically 2 (two) times daily. 11/02/23   Orlean Alan HERO, FNP     Critical care provider statement:   Total critical care time: 33 minutes   Performed by: Parris MD   Critical care time was exclusive of separately billable procedures and treating other patients.   Critical care was necessary to treat or prevent imminent or life-threatening deterioration.   Critical care was time spent personally by me on the following activities: development of treatment plan with patient and/or surrogate as well as nursing, discussions with consultants, evaluation of patient's response to treatment, examination of patient, obtaining history from patient or surrogate, ordering and performing treatments and interventions, ordering and review of laboratory studies, ordering and review of radiographic studies, pulse oximetry and re-evaluation of patient's condition.    Theora Vankirk, M.D.  Pulmonary & Critical Care Medicine               [1]  Allergies Allergen Reactions   Morphine  And Codeine Hives   Levaquin [Levofloxacin] Itching and Other (See Comments)  Severe itching; prickly sensation; severe left leg pain; immobility.   Enalapril  Other (See Comments)    hallucinations   Latex Rash   Tape Rash and Other  (See Comments)   "

## 2024-07-04 NOTE — Plan of Care (Signed)
  Problem: Clinical Measurements: Goal: Ability to maintain clinical measurements within normal limits will improve Outcome: Progressing Goal: Will remain free from infection Outcome: Progressing Goal: Diagnostic test results will improve Outcome: Progressing Goal: Respiratory complications will improve Outcome: Progressing Goal: Cardiovascular complication will be avoided Outcome: Progressing   Problem: Activity: Goal: Risk for activity intolerance will decrease Outcome: Progressing   Problem: Pain Managment: Goal: General experience of comfort will improve and/or be controlled Outcome: Progressing   Problem: Skin Integrity: Goal: Risk for impaired skin integrity will decrease Outcome: Progressing

## 2024-07-04 NOTE — Consult Note (Signed)
 PHARMACY CONSULT NOTE - ELECTROLYTES  Pharmacy Consult for Electrolyte Monitoring and Replacement   Recent Labs: Height: 5' 6 (167.6 cm) Weight: 74.7 kg (164 lb 10.9 oz) IBW/kg (Calculated) : 59.3 Estimated Creatinine Clearance: 19.2 mL/min (A) (by C-G formula based on SCr of 3.18 mg/dL (H)). Potassium (mmol/L)  Date Value  07/04/2024 3.9  07/11/2014 4.7   Magnesium  (mg/dL)  Date Value  98/79/7973 1.9  04/10/2014 1.9   Calcium  (mg/dL)  Date Value  98/79/7973 9.8   Calcium , Total (mg/dL)  Date Value  98/74/7983 8.3 (L)   Albumin  (g/dL)  Date Value  98/79/7973 3.1 (L)  07/07/2014 3.1 (L)   Phosphorus (mg/dL)  Date Value  98/79/7973 2.7  07/09/2014 3.9   Sodium (mmol/L)  Date Value  07/04/2024 134 (L)  07/09/2014 139    Assessment  [name] is a 64 y.o. female presenting with syncope/in-hospital cardiac arrest. PMH significant for ESRD HD MWF, AF on eliquis , HFrEF, COPD, prior CVA. Pharmacy has been consulted to monitor and replace electrolytes.  Diet: 48mL/hr tube feeds MIVF: n/a Pertinent medications: n/a  Goal of Therapy: Electrolytes WNL  Plan:  No interventions indicated Check BMP, Mg, Phos with AM labs after HD session today  Thank you for allowing pharmacy to be a part of this patient's care.  Belvie Macintosh, PharmD Candidate 07/04/2024 9:56 AM

## 2024-07-04 NOTE — Progress Notes (Signed)
 [facility] CARDIOLOGY PROGRESS NOTE   Patient ID: [name] MRN: [id number] DOB/AGE: [birth year] 62 y.o.  Admit date: 07/01/2024 Referring Physician Dr. Devaughn Ban  Primary Physician Orlean Alan HERO, FNP  Primary Cardiologist Dr. Denyse Bathe Reason for Consultation syncope  HPI: [name] is a 64 y.o. female with a past medical history of chronic HFrEF, coronary artery disease, atrial fibrillation on eliquis , COPD, ESRD on HD, bipolar disorder, hypotension on midodrine , hx CVA  who presented to the ED on 07/01/2024 for syncopal event at home. Cardiology was consulted for further evaluation.   Interval History:  -Patient seen and examined this AM, intubated and sedated.  -BP stable, on levophed  and off vasopressin .  -More frequent NSVT and ventricular ectopy noted on tele this AM.   Review of systems complete and found to be negative unless listed above   Vitals:   07/04/24 0745 07/04/24 0800 07/04/24 0815 07/04/24 0830  BP: 112/60 (!) 110/43 116/62 113/63  Pulse: 68 74 (!) 57 71  Resp: (!) 21 18 18 14   Temp:      TempSrc:      SpO2: 96% 94% 96% 96%  Weight:      Height:         Intake/Output Summary (Last 24 hours) at 07/04/2024 9076 Last data filed at 07/04/2024 9085 Gross per 24 hour  Intake 2014.44 ml  Output 300 ml  Net 1714.44 ml     PHYSICAL EXAM General: Ill appearing female, intubated/sedated. HEENT: Normocephalic and atraumatic. Neck: No JVD.  Lungs: Mechanical breath sounds Heart: Irregularly irregular, controlled rate. Normal S1 and S2 without gallops or murmurs. Radial & DP pulses 2+ bilaterally. Abdomen: Non-distended appearing.  Msk: Normal strength and tone for age. Extremities: No clubbing, cyanosis or edema.     LABS: Basic Metabolic Panel: Recent Labs    07/03/24 0543 07/03/24 1847 07/04/24 0441 07/04/24 0727  NA 134*  --  134*  --   K 4.5   < > 3.9 3.9  CL 90*  --  92*  --   CO2 23  --  25  --   GLUCOSE 169*  --   115*  --   BUN 26*  --  21  --   CREATININE 4.09*  --  3.18*  --   CALCIUM  10.2  --  9.8  --   MG  --    < > 1.9 1.9  PHOS  --   --  2.7  --    < > = values in this interval not displayed.   Liver Function Tests: Recent Labs    07/02/24 0526 07/02/24 1646 07/04/24 0441  AST 37 54*  --   ALT 14 19  --   ALKPHOS 72 81  --   BILITOT 1.3* 1.4*  --   PROT 6.9 6.6  --   ALBUMIN  3.5 3.5 3.1*   No results for input(s): LIPASE, AMYLASE in the last 72 hours. CBC: Recent Labs    07/01/24 1336 07/02/24 0526 07/03/24 0543 07/04/24 0441  WBC 12.0*   < > 14.7* 13.9*  NEUTROABS 10.4*  --   --   --   HGB 12.7   < > 12.4 12.1  HCT 38.7   < > 37.9 35.8*  MCV 97.0   < > 96.2 93.0  PLT 82*   < > 63* 73*   < > = values in this interval not displayed.   Cardiac Enzymes: No results for input(s): CKTOTAL, CKMB,  CKMBINDEX, TROPONINIHS in the last 72 hours. BNP: No results for input(s): BNP in the last 72 hours. D-Dimer: No results for input(s): DDIMER in the last 72 hours. Hemoglobin A1C: No results for input(s): HGBA1C in the last 72 hours. Fasting Lipid Panel: Recent Labs    07/03/24 0543  TRIG 293*   Thyroid  Function Tests: No results for input(s): TSH, T4TOTAL, T3FREE, THYROIDAB in the last 72 hours.  Invalid input(s): FREET3 Anemia Panel: No results for input(s): VITAMINB12, FOLATE, FERRITIN, TIBC, IRON, RETICCTPCT in the last 72 hours.  DG Abd 1 View Result Date: 07/02/2024 EXAM: 1 or 2 VIEW XRAY OF THE ABDOMEN 07/02/2024 09:53:25 PM COMPARISON: None available. CLINICAL HISTORY: Encounter for orogastric (OG) tube placement FINDINGS: LINES, TUBES AND DEVICES: Enteric tube tip is in the body of the stomach. The distal 5 cm of the tube is folded upon itself/kinked. Vascular stent in place overlying the expected region of the left common iliac vein. Cardiac paddles noted overlying the left hemiabdomen. BOWEL: Paucity of bowel gas. SOFT  TISSUES: Vascular calcifications are seen in the abdomen and pelvis. BONES: No acute fracture. IMPRESSION: 1. Enteric tube tip in the body of the stomach, with the distal 5 cm folded upon itself and kinked. Electronically signed by: Greig Pique MD 07/02/2024 10:02 PM EST RP Workstation: [id number]   CT CERVICAL SPINE WO CONTRAST Result Date: 07/02/2024 EXAM: CT CERVICAL SPINE WITHOUT CONTRAST 07/02/2024 02:47:57 PM TECHNIQUE: CT of the cervical spine was performed without the administration of intravenous contrast. Multiplanar reformatted images are provided for review. Automated exposure control, iterative reconstruction, and/or weight based adjustment of the mA/kV was utilized to reduce the radiation dose to as low as reasonably achievable. COMPARISON: None available. CLINICAL HISTORY: Acute severe posterior neck pain. FINDINGS: BONES AND ALIGNMENT: Interval collapse of the superior endplate of C6 and inferior endplate of C5 and exaggerated kyphosis may reflect marked progression of degenerative change. Sequelae of infection could have a similar appearance. Slight retrolisthesis at C5-C6, C6-C7 and C7-T1 are similar to the prior exam. DEGENERATIVE CHANGES: Ankylosis is present across the disc space anteriorly. Moderate irregular endplate changes are present at C6-C7 without loss of height. Mild endplate changes are present at C4-C5 without loss of height. The endplate collapse at C5-C6 is also a degenerative change. SOFT TISSUES: No prevertebral soft tissue swelling. Atherosclerotic changes are present in the proximal great vessels and at the carotid bifurcations bilaterally without definite stenosis. IMPRESSION: 1. Interval collapse of the superior endplate of C6 and inferior endplate of C5 with exaggerated kyphosis, which may reflect marked progression of degenerative change or sequelae of infection. MRI of the cervical spine without and with contrast may be useful for further evaluation. 2. Progressive  degenerative endplate changes at C4-5 and C6-7 Electronically signed by: Lonni Necessary MD 07/02/2024 04:00 PM EST RP Workstation: [id number]   US  ASCITES (ABDOMEN LIMITED) Result Date: 07/02/2024 EXAM: LIMITED ABDOMINAL ULTRASOUND FOR ASCITES EVALUATION TECHNIQUE: Limited real-time sonography of all 4 quadrants of the abdomen was performed for evaluation of ascites. COMPARISON: US  Abdomen Limited 03/14/2024; 04/30/2023. CLINICAL HISTORY: Cirrhosis (HCC). FINDINGS: RIGHT UPPER QUADRANT: Moderate volume of ascites noted. LEFT UPPER QUADRANT: Moderate volume of ascites noted. RIGHT LOWER QUADRANT: Moderate volume of ascites noted. LEFT LOWER QUADRANT: Minimal fluid in the left lower quadrant. OTHER: Moderate fluid midline. Limited visualization of the rest of the abdomen demonstrates no acute abnormality. IMPRESSION: 1. Moderate volume of ascites in the right upper quadrant, right lower quadrant, left upper quadrant, and midline. 2.  minimal fluid in the left lower quadrant. Electronically signed by: Norleen Boxer MD 07/02/2024 03:47 PM EST RP Workstation: [id number]     ECHO 06/23/2024: 1. Left ventricular ejection fraction, by estimation, is 25 to 30%. The left ventricle has severely decreased function. The left ventricle demonstrates global hypokinesis. The left ventricular internal cavity size was severely dilated. Left ventricular diastolic function could not be evaluated.   2. Right ventricular systolic function is severely reduced. The right  ventricular size is severely enlarged. Mildly increased right ventricular  wall thickness.   3. Left atrial size was mildly dilated.   4. Right atrial size was severely dilated.   5. The mitral valve is normal in structure. Mild mitral valve regurgitation.   6. Tricuspid valve regurgitation is severe.   7. The aortic valve is grossly normal. Aortic valve regurgitation is not visualized. Aortic valve sclerosis is present, with no evidence of aortic  valve  stenosis.   TELEMETRY (personally reviewed): atrial fibrillation PVCs rate 80s  EKG (personally reviewed): AF RBBB rate 167 bpm  DATA reviewed by me 07/04/24: last 24h vitals tele labs imaging I/O, hospitalist progress note  Principal Problem:   Syncope and collapse Active Problems:   ESRD on hemodialysis (HCC)   Chronic hypotension   S/P CABG x 3   Mild dementia associated with other underlying disease, without behavioral disturbance, psychotic disturbance, mood disturbance, or anxiety (HCC)   Anemia of chronic disease   Paroxysmal atrial fibrillation (HCC)   Hypothyroidism   Chronic combined systolic and diastolic CHF (congestive heart failure) (HCC)   Hepatitis C antibody positive in blood   Other cirrhosis of liver (HCC)   Hypertension   Bipolar disorder in full remission, most recent episode unspecified type   Chronic pain syndrome   Other secondary kyphosis, cervical region   Cervical spondylosis    ASSESSMENT AND PLAN: [name] is a 64 y.o. female with a past medical history of chronic HFrEF, coronary artery disease, atrial fibrillation on eliquis , COPD, ESRD on HD, bipolar disorder, hypotension on midodrine , hx CVA  who presented to the ED on 07/01/2024 for syncopal event at home. Cardiology was consulted for further evaluation.   # Atrial fibrillation RVR # Paroxysmal atrial fibrillation # Syncope/ ?cardiac arrest # Chronic HFrEF # Coronary artery disease Patient brought to ED after being found down at home by family. Initially was admitted to the floor but on 07/02/24 she became unresponsive and was tachycardia, concern for wide complex tachycardia but has RBBB at baseline. Underwent ACLS, received 1 shock and 2 doses of epi with ROSC. Started on IV amiodarone .  -Continue IV amiodarone  infusion.  -EP consulted this AM, appreciate their assistance.  -Continue IV heparin .  -Further management per primary team. No plan for invasive cardiac procedures at this  time.  This patient's case was discussed and created with Dr. Florencio and he is in agreement.  Signed:  Danita Bloch, PA-C  07/04/2024, 9:23 AM [facility] Cardiology

## 2024-07-04 NOTE — Progress Notes (Signed)
 " [facility]  PROGRESS NOTE   Subjective:   Patient seen at bedside.   Patient remains critically ill, intubated and sedated.  Irregular rhythm noted Pressors-vasopressin  and norepinephrine  Sedation-propofol  Amiodarone  drip Heparin  drip Tube feeds going at 30 cc/h Ventilator assisted, FiO2 28%, PEEP 5   Objective:  Vital signs: Blood pressure 91/64, pulse 62, temperature 100.2 F (37.9 C), resp. rate 11, height 5' 6 (1.676 m), weight 74.7 kg, SpO2 93%.  Intake/Output Summary (Last 24 hours) at 07/04/2024 1413 Last data filed at 07/04/2024 1218 Gross per 24 hour  Intake 2416.28 ml  Output 300 ml  Net 2116.28 ml   Filed Weights   07/03/24 1315 07/03/24 1656 07/04/24 0400  Weight: 67.3 kg 67.3 kg 74.7 kg     Physical Exam: General:  No acute distress  Head:  Normocephalic, atraumatic.  ET tube, OG tube in place  Eyes:  Anicteric  Lungs:   Clear to auscultation, ventilator assisted  Heart:  S1S2 no rubs  Abdomen:   Soft, distended, hypoactive bowel sounds  Extremities:  + peripheral edema.  Neurologic:  sedated  Skin:  Left foot wound  Access: Right IJ dialysis catheter    Basic Metabolic Panel: Recent Labs  Lab 07/01/24 2155 07/02/24 0526 07/02/24 1646 07/03/24 0543 07/03/24 1847 07/04/24 0441 07/04/24 0727  NA 135 135 136 134*  --  134*  --   K 4.3 4.3 3.9 4.5 3.8 3.9 3.9  CL 93* 92* 90* 90*  --  92*  --   CO2 19* 23 23 23   --  25  --   GLUCOSE 90 106* 154* 169*  --  115*  --   BUN 32* 35* 18 26*  --  21  --   CREATININE 5.43* 5.79* 3.57* 4.09*  --  3.18*  --   CALCIUM  10.2 10.5* 10.3 10.2  --  9.8  --   MG 2.1  --   --   --  1.8 1.9 1.9  PHOS  --   --   --   --   --  2.7  --    GFR: Estimated Creatinine Clearance: 19.2 mL/min (A) (by C-G formula based on SCr of 3.18 mg/dL (H)).  Liver Function Tests: Recent Labs  Lab 07/01/24 1336 07/01/24 2155 07/02/24 0526 07/02/24 1646 07/04/24 0441  AST 36 36 37 54*  --   ALT 15 15 14 19   --    ALKPHOS 71 68 72 81  --   BILITOT 1.2 1.2 1.3* 1.4*  --   PROT 7.2 6.8 6.9 6.6  --   ALBUMIN  3.8 3.5 3.5 3.5 3.1*   No results for input(s): LIPASE, AMYLASE in the last 168 hours. Recent Labs  Lab 07/01/24 1726  AMMONIA 15    CBC: Recent Labs  Lab 07/01/24 1336 07/02/24 0526 07/02/24 1646 07/03/24 0543 07/04/24 0441  WBC 12.0* 12.2* 14.2* 14.7* 13.9*  NEUTROABS 10.4*  --   --   --   --   HGB 12.7 12.3 12.2 12.4 12.1  HCT 38.7 37.0 37.3 37.9 35.8*  MCV 97.0 95.9 96.4 96.2 93.0  PLT 82* 68* 70* 63* 73*     HbA1C: No results found for: HGBA1C  Urinalysis: No results for input(s): COLORURINE, LABSPEC, PHURINE, GLUCOSEU, HGBUR, BILIRUBINUR, KETONESUR, PROTEINUR, UROBILINOGEN, NITRITE, LEUKOCYTESUR in the last 72 hours.  Invalid input(s): APPERANCEUR    Imaging: DG Abd 1 View Result Date: 07/04/2024 CLINICAL DATA:  Abdominal distension. EXAM: ABDOMEN - 1 VIEW COMPARISON:  Abdominal radiograph dated 07/02/2024. FINDINGS: No bowel dilatation or evidence of obstruction. No free air or radiopaque calculi. Atherosclerotic calcification of the abdominal aorta and iliac arteries. A cannula is noted to the right of the spine. Calcified uterine fibroid. No acute osseous pathology. IMPRESSION: Nonobstructive bowel gas pattern. Electronically Signed   By: Vanetta Chou M.D.   On: 07/04/2024 13:15   DG Abd 1 View Result Date: 07/02/2024 EXAM: 1 or 2 VIEW XRAY OF THE ABDOMEN 07/02/2024 09:53:25 PM COMPARISON: None available. CLINICAL HISTORY: Encounter for orogastric (OG) tube placement FINDINGS: LINES, TUBES AND DEVICES: Enteric tube tip is in the body of the stomach. The distal 5 cm of the tube is folded upon itself/kinked. Vascular stent in place overlying the expected region of the left common iliac vein. Cardiac paddles noted overlying the left hemiabdomen. BOWEL: Paucity of bowel gas. SOFT TISSUES: Vascular calcifications are seen in the abdomen and  pelvis. BONES: No acute fracture. IMPRESSION: 1. Enteric tube tip in the body of the stomach, with the distal 5 cm folded upon itself and kinked. Electronically signed by: Greig Pique MD 07/02/2024 10:02 PM EST RP Workstation: [id number]   CT CERVICAL SPINE WO CONTRAST Result Date: 07/02/2024 EXAM: CT CERVICAL SPINE WITHOUT CONTRAST 07/02/2024 02:47:57 PM TECHNIQUE: CT of the cervical spine was performed without the administration of intravenous contrast. Multiplanar reformatted images are provided for review. Automated exposure control, iterative reconstruction, and/or weight based adjustment of the mA/kV was utilized to reduce the radiation dose to as low as reasonably achievable. COMPARISON: None available. CLINICAL HISTORY: Acute severe posterior neck pain. FINDINGS: BONES AND ALIGNMENT: Interval collapse of the superior endplate of C6 and inferior endplate of C5 and exaggerated kyphosis may reflect marked progression of degenerative change. Sequelae of infection could have a similar appearance. Slight retrolisthesis at C5-C6, C6-C7 and C7-T1 are similar to the prior exam. DEGENERATIVE CHANGES: Ankylosis is present across the disc space anteriorly. Moderate irregular endplate changes are present at C6-C7 without loss of height. Mild endplate changes are present at C4-C5 without loss of height. The endplate collapse at C5-C6 is also a degenerative change. SOFT TISSUES: No prevertebral soft tissue swelling. Atherosclerotic changes are present in the proximal great vessels and at the carotid bifurcations bilaterally without definite stenosis. IMPRESSION: 1. Interval collapse of the superior endplate of C6 and inferior endplate of C5 with exaggerated kyphosis, which may reflect marked progression of degenerative change or sequelae of infection. MRI of the cervical spine without and with contrast may be useful for further evaluation. 2. Progressive degenerative endplate changes at C4-5 and C6-7 Electronically  signed by: Lonni Necessary MD 07/02/2024 04:00 PM EST RP Workstation: [id number]   US  ASCITES (ABDOMEN LIMITED) Result Date: 07/02/2024 EXAM: LIMITED ABDOMINAL ULTRASOUND FOR ASCITES EVALUATION TECHNIQUE: Limited real-time sonography of all 4 quadrants of the abdomen was performed for evaluation of ascites. COMPARISON: US  Abdomen Limited 03/14/2024; 04/30/2023. CLINICAL HISTORY: Cirrhosis (HCC). FINDINGS: RIGHT UPPER QUADRANT: Moderate volume of ascites noted. LEFT UPPER QUADRANT: Moderate volume of ascites noted. RIGHT LOWER QUADRANT: Moderate volume of ascites noted. LEFT LOWER QUADRANT: Minimal fluid in the left lower quadrant. OTHER: Moderate fluid midline. Limited visualization of the rest of the abdomen demonstrates no acute abnormality. IMPRESSION: 1. Moderate volume of ascites in the right upper quadrant, right lower quadrant, left upper quadrant, and midline. 2.  minimal fluid in the left lower quadrant. Electronically signed by: Norleen Boxer MD 07/02/2024 03:47 PM EST RP Workstation: [id number]     Medications:    amiodarone   30 mg/hr (07/04/24 1218)   cefTRIAXone  (ROCEPHIN )  IV Stopped (07/03/24 2117)   feeding supplement (VITAL AF 1.2 CAL) 30 mL/hr at 07/04/24 1218   heparin  1,150 Units/hr (07/04/24 1218)   lidocaine  1 mg/min (07/04/24 1251)   norepinephrine  (LEVOPHED ) Adult infusion 2 mcg/min (07/04/24 1218)   propofol  (DIPRIVAN ) infusion 30 mcg/kg/min (07/04/24 1218)   vasopressin  Stopped (07/03/24 2206)    Chlorhexidine  Gluconate Cloth  6 each Topical Q0600   collagenase   1 Application Topical Daily   feeding supplement (PROSource TF20)  60 mL Per Tube Daily   free water   30 mL Per Tube Q4H   levothyroxine   75 mcg Oral Q0600   midodrine   10 mg Oral TID WC   multivitamin  1 tablet Per Tube QHS   nutrition supplement (JUVEN)  1 packet Per Tube BID BM   mouth rinse  15 mL Mouth Rinse Q2H   pantoprazole  (PROTONIX ) IV  40 mg Intravenous Daily   sodium chloride  flush  10-40 mL  Intracatheter Q12H   venlafaxine   37.5 mg Oral BID WC    Assessment/ Plan:     64 year old female with history of COPD, atrial fibrillation, bipolar disorder, end-stage renal disease on hemodialysis Monday Wednesday and Friday now admitted with history of fall.  Cardiac arrest on Sunday, 07/02/2024   ESRD: HD Monday Wednesday and Friday.  Will schedule dialysis on Wednesday.  Multiple pressors.  No volume removal.  ANEMIA of chronic kidney disease: Hemoglobin acceptable.  Will continue to monitor closely.   MBD: Will check PTH, calcium  and phosphorus levels.  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  Will continue sodium thiosulfate  when she stabilizes.   Hypotension/VOL: Currently hypotensive and requiring pressors.  If she does not tolerate intermittent hemodialysis will need to be changed to CRRT.  Acute respiratory failure-requiring ventilator postcardiac arrest on 07/02/2024.    Labs and medications reviewed. Will continue to follow along with you.   LOS: 2 Saralee Stank, MD [facility] 1/20/20262:13 PM  "

## 2024-07-04 NOTE — Consult Note (Signed)
 "   ELECTROPHYSIOLOGY CONSULT NOTE    Patient ID: [name] MRN: [id number], DOB/AGE: Jul 01, 1960 64 y.o.  Admit date: 07/01/2024 Date of Consult: 07/04/2024  Primary Physician: Orlean Alan HERO, FNP Primary Cardiologist: None  Electrophysiologist: new    Referring Provider: Dr. Parris  Patient Profile: [name] is a 64 y.o. female with a history of CAD s/p CABG, ESRD on HD, parox AFib, HFrEF (LVEF 25-30%), previous ICD removed d/t infection, PVD, hypotension on midodrine , COPD on home oxygen , chronic pain, CVA, bipolar disorder who is being seen today for the evaluation of abnormal heart rhythm at the request of Dr. Florencio.  HPI:  [name] is a 64 y.o. female with PMH as above who initially presented to ER after being found down by family. Several recent hospitalizations, most recently on 1/8 for fluid overload, cardiogenic shock where she also had episode of syncope found to be hypotensive.   In ER was lethargic for unclear reasons required increased oxygen  demand thought d/t pulm edema.   After dialysis session 1/18 while back in room had episode of pulselessness with WCT. Rec'd 2 rounds of epi, shock, IV amio, bicarb, calcium . Intubated and transferred to ICU.  Amio gtt continued with ongoing runs of NSVT, and so EP has been consulted.   She remains intubated, sedated on amio gtt, levophed  gtt. Per nursing, she will move extremities to stimulation, but otherwise does not respond to voice.  Her brother is at bedside who found her down prompting this hospitalization, and when she was found down 2 weeks go prompting the previous hospitalization. He says that she will not return home at discharge, will go to nursing home.     Labs Potassium3.9 (01/20 9272) Magnesium   1.9 (01/20 0727) Creatinine, ser  3.18* (01/20 0441) PLT  73* (01/20 0441) HGB  12.1 (01/20 0441) WBC 13.9* (01/20 0441)  .    Past Medical History:  Diagnosis Date   Acute lacunar  infarction (HCC) 03/03/2014   a.) MRI brain 03/03/2014: two acute punctate lacunar infarcts anteriorly in the BILATERAL centrum semiovale   Adult behavior problems    Anemia of chronic renal failure    Aortic atherosclerosis    Arthritis    Atrial fibrillation and flutter (HCC)    a.) CHA2DS2VASc = 6 (sex, HFrEF, HTN, CVA x 2, prior MI/vascular disease) as of 02/04/2024; b.) rate/rhythm maintained on oral metoprolol  succinate; chronically anticoagulated with apixaban    Bipolar disorder (HCC)    Cardiomegaly    Cerebral microvascular disease    CHF (congestive heart failure) (HCC)    Chicken pox    Cholelithiasis    Chronic respiratory failure with hypoxia (HCC) 12/19/2021   COPD (chronic obstructive pulmonary disease) (HCC)    Coronary artery disease    DDD (degenerative disc disease), cervical    DDD (degenerative disc disease), thoracolumbar    Diverticulosis    Dyspnea    ESRD (end stage renal disease) on dialysis (M,W,F)    GERD (gastroesophageal reflux disease)    Gout    HCV (hepatitis C virus)    Headache    HFrEF (heart failure with reduced ejection fraction) (HCC)    History of delirium 04/12/2014   History of substance abuse (HCC)    a.) tobacco + cocaine   Hyperkalemia 11/13/2014   Hyperlipidemia    Hypertension    Hypotension 02/24/2014   Hypothyroidism    Ischemic cardiomyopathy    Nose colonized with MRSA 02/01/2024   a.) presurgical PCR (+)  on 02/01/2024 prior to A/V FISTULAGRAM; b.) presurgical PCR (+) on 03/07/2024 prior to AV FISTULA CREATION   NSTEMI (non-ST elevated myocardial infarction) (HCC) 10/08/2013   NSTEMI (non-ST elevated myocardial infarction) (HCC) 01/23/2002   a.) LHC 01/24/2002 --> multi-vessel CAD with IRA being 90% OM2 --> delayed PCI until 01/25/2002 --> 3.0 x 23 mm BX Velocity stent   Obesity    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    On apixaban  therapy    Peripheral vascular disease    Pneumonia    Postoperative anemia  due to acute blood loss 02/27/2014   Postoperative cerebrovascular infarction following cardiac surgery 02/21/2014   a.) developed RIGHT upper/lower extremity weakness following cardiac revascularization --> neuro consult and head CT --> Hypodensity noted in the area of the LEFT motor cortex consistent with a component of acute ischemia   Pulmonary hypertension (HCC)    RBBB (right bundle branch block)    Renal osteodystrophy    S/P CABG x 3 02/20/2014   a.) LIMA-LAD, SVG-OM1, SVG-PDA   Stroke (HCC) 05/23/2010   a.) brain MRI 05/23/2010: BILATERAL acute infarcts and old prior infarcts; RIGHT frontal lobe extending into the white matter. Small focus of restricted diffusion in the cortex of the medial LEFT frontoparietal lobe. Periventricular T2 and multiple foci of T2 hyperintensity in the subcortical white matter and increased FLAIR signal in the cortex of the LEFT frontal lobe.   Syncope and collapse 07/25/2020     Surgical History:  Past Surgical History:  Procedure Laterality Date   A/V FISTULAGRAM Left 03/30/2019   Procedure: A/V FISTULAGRAM;  Surgeon: Marea Selinda RAMAN, MD;  Location: [facility];  Service: Cardiovascular;  Laterality: Left;   A/V FISTULAGRAM Left 10/06/2019   Procedure: A/V FISTULAGRAM;  Surgeon: Marea Selinda RAMAN, MD;  Location: [facility];  Service: Cardiovascular;  Laterality: Left;   A/V FISTULAGRAM Left 05/19/2021   Procedure: A/V FISTULAGRAM;  Surgeon: Marea Selinda RAMAN, MD;  Location: [facility];  Service: Cardiovascular;  Laterality: Left;   A/V FISTULAGRAM Left 01/29/2022   Procedure: A/V Fistulagram;  Surgeon: Marea Selinda RAMAN, MD;  Location: [facility];  Service: Cardiovascular;  Laterality: Left;   A/V FISTULAGRAM Left 04/30/2022   Procedure: A/V Fistulagram;  Surgeon: Marea Selinda RAMAN, MD;  Location: [facility];  Service: Cardiovascular;  Laterality: Left;   A/V FISTULAGRAM Left 08/20/2022   Procedure: A/V Fistulagram;  Surgeon:  Marea Selinda RAMAN, MD;  Location: [facility];  Service: Cardiovascular;  Laterality: Left;   A/V FISTULAGRAM N/A 12/03/2022   Procedure: A/V Fistulagram;  Surgeon: Marea Selinda RAMAN, MD;  Location: [facility];  Service: Cardiovascular;  Laterality: N/A;   A/V FISTULAGRAM Left 02/11/2023   Procedure: A/V Fistulagram;  Surgeon: Marea Selinda RAMAN, MD;  Location: [facility];  Service: Cardiovascular;  Laterality: Left;   A/V FISTULAGRAM Left 02/07/2024   Procedure: A/V Fistulagram;  Surgeon: Marea Selinda RAMAN, MD;  Location: [facility];  Service: Cardiovascular;  Laterality: Left;   COLONOSCOPY     COLONOSCOPY WITH PROPOFOL  N/A 04/22/2021   Procedure: COLONOSCOPY WITH PROPOFOL ;  Surgeon: Maryruth Ole DASEN, MD;  Location: [facility];  Service: Endoscopy;  Laterality: N/A;   CORONARY ANGIOPLASTY WITH STENT PLACEMENT Left 01/25/2002   Procedure: CORONARY ANGIOPLASTY WITH STENT PLACEMENT; Location: [facility]; Surgeon: Margie Lovelace, MD   CORONARY ARTERY BYPASS GRAFT N/A 02/20/2014   Procedure: CORONARY ARTERY BYPASS GRAFT; Location: [facility]; Surgeon:  Reyes Fruits, MD   DIALYSIS FISTULA CREATION     DIALYSIS/PERMA CATHETER INSERTION N/A 04/08/2023   Procedure: DIALYSIS/PERMA CATHETER INSERTION;  Surgeon: Marea Selinda RAMAN, MD;  Location: [facility];  Service: Cardiovascular;  Laterality: N/A;   DIALYSIS/PERMA CATHETER REMOVAL N/A 07/06/2023   Procedure: DIALYSIS/PERMA CATHETER REMOVAL;  Surgeon: [name] Cordella MATSU, MD;  Location: [facility];  Service: Cardiovascular;  Laterality: N/A;   ESOPHAGOGASTRODUODENOSCOPY     ESOPHAGOGASTRODUODENOSCOPY N/A 04/22/2021   Procedure: ESOPHAGOGASTRODUODENOSCOPY (EGD);  Surgeon: Maryruth Ole DASEN, MD;  Location: [facility];  Service: Endoscopy;  Laterality: N/A;   FLEXIBLE SIGMOIDOSCOPY N/A 02/23/2019   Procedure: FLEXIBLE SIGMOIDOSCOPY;  Surgeon: Gaylyn Gladis PENNER, MD;  Location: [facility] ENDOSCOPY;  Service: Endoscopy;   Laterality: N/A;   IABP INSERTION  02/20/2014   Procedure: INTRA-AORTIC BALLOON PUMP INSERTION; Location: [facility]; Surgeon: Reyes Fruits, MD   INSERTION OF ARTERIOVENOUS (AV) ARTEGRAFT ARM Right 03/09/2024   Procedure: INSERTION, GRAFT, ARTERIOVENOUS, UPPER EXTREMITY;  Surgeon: Marea Selinda RAMAN, MD;  Location: [facility];  Service: Vascular;  Laterality: Right;  BRACHIAL AXILLARY   INSERTION OF DIALYSIS CATHETER N/A 04/20/2024   Procedure: INSERTION OF DIALYSIS CATHETER;  Surgeon: Marea Selinda RAMAN, MD;  Location: [facility];  Service: Vascular;  Laterality: N/A;   LEFT HEART CATH AND CORONARY ANGIOGRAPHY Left 01/24/2002   Procedure: LEFT HEART CATH AND CORONARY ANGIOGRAPHY; Location: [facility]; Surgeon: Margie Lovelace, MD   LEFT HEART CATH AND CORONARY ANGIOGRAPHY Left 05/25/2012   Procedure: LEFT HEART CATH AND CORONARY ANGIOGRAPHY; Location: [facility]; Surgeon: Margie Lovelace, MD   LEFT HEART CATH AND CORONARY ANGIOGRAPHY Left 10/09/2013   Procedure: LEFT HEART CATH AND CORONARY ANGIOGRAPHY; Location: [facility]; Surgeon: Vinie Jude, MD   LEFT HEART CATH AND CORS/GRAFTS ANGIOGRAPHY N/A 08/08/2018   Procedure: LEFT HEART CATH AND CORS/GRAFTS ANGIOGRAPHY;  Surgeon: Fernand Denyse LABOR, MD;  Location: [facility];  Service: Cardiovascular;  Laterality: N/A;   LEFT HEART CATH AND CORS/GRAFTS ANGIOGRAPHY N/A 01/30/2019   Procedure: LEFT HEART CATH AND CORS/GRAFTS ANGIOGRAPHY;  Surgeon: Fernand Denyse LABOR, MD;  Location: [facility];  Service: Cardiovascular;  Laterality: N/A;   LIGATION ARTERIOVENOUS GORTEX GRAFT Right 04/20/2024   Procedure: LIGATION ARTERIOVENOUS GORTEX GRAFT;  Surgeon: Marea Selinda RAMAN, MD;  Location: [facility];  Service: Vascular;  Laterality: Right;   LOWER EXTREMITY ANGIOGRAPHY Left 04/24/2024   Procedure: Lower Extremity Angiography;  Surgeon: Marea Selinda RAMAN, MD;  Location: [facility];  Service: Cardiovascular;  Laterality: Left;   ultrasound guided pericardiocentesis     UPPER EXTREMITY  ANGIOGRAPHY Right 03/30/2024   Procedure: Upper Extremity Angiography;  Surgeon: Marea Selinda RAMAN, MD;  Location: [facility];  Service: Cardiovascular;  Laterality: Right;     Medications Prior to Admission  Medication Sig Dispense Refill Last Dose/Taking   albuterol  (VENTOLIN  HFA) 108 (90 Base) MCG/ACT inhaler Inhale 1-2 puffs into the lungs every 6 (six) hours as needed for wheezing or shortness of breath. 20.1 g 3 Unknown   apixaban  (ELIQUIS ) 5 MG TABS tablet Take 5 mg by mouth 2 (two) times daily.   06/30/2024   clobetasol cream (TEMOVATE) 0.05 % Apply 1 Application topically daily.   Unknown   hydrOXYzine  (ATARAX /VISTARIL ) 50 MG tablet Take 50 mg by mouth 2 (two) times daily.   06/30/2024   levothyroxine  (SYNTHROID ) 75 MCG tablet Take 75 mcg by mouth daily before breakfast.    06/30/2024   lidocaine -prilocaine  (EMLA ) cream Apply 1 Application topically once.  Unknown   [Paused] metoprolol  succinate (TOPROL -XL) 25 MG 24 hr tablet Take 1 tablet by mouth every morning.   Taking   midodrine  (PROAMATINE ) 10 MG tablet Take 1 tablet (10 mg total) by mouth 3 (three) times daily. 90 tablet 0 06/30/2024   oxyCODONE -acetaminophen  (PERCOCET) 5-325 MG tablet Take 1 tablet by mouth every 6 (six) hours as needed for severe pain (pain score 7-10). 20 tablet 0 07/01/2024 at  4:00 AM   pantoprazole  (PROTONIX ) 40 MG tablet Take 1 tablet (40 mg total) by mouth every morning. 30 tablet 1 06/30/2024   pregabalin (LYRICA) 50 MG capsule Take 50 mg by mouth daily. (Patient not taking: Reported on 06/23/2024)   Not Taking   ranolazine  (RANEXA ) 1000 MG SR tablet Take 1,000 mg by mouth 2 (two) times daily. (Patient not taking: Reported on 06/23/2024)   Not Taking   rosuvastatin  (CRESTOR ) 20 MG tablet Take 20 mg by mouth at bedtime. (Patient not taking: Reported on 06/23/2024)   Not Taking   sevelamer  carbonate (RENVELA ) 800 MG tablet Take 3,200 mg by mouth 3 (three) times daily with meals. (Patient not taking: Reported on  06/23/2024)   Not Taking   venlafaxine  (EFFEXOR ) 37.5 MG tablet Take 37.5 mg by mouth 2 (two) times daily with a meal. (Patient not taking: Reported on 06/23/2024)   Not Taking    Inpatient Medications:   Chlorhexidine  Gluconate Cloth  6 each Topical Q0600   collagenase   1 Application Topical Daily   feeding supplement (PROSource TF20)  60 mL Per Tube Daily   free water   30 mL Per Tube Q4H   levothyroxine   75 mcg Oral Q0600   midodrine   10 mg Oral TID WC   multivitamin  1 tablet Per Tube QHS   nutrition supplement (JUVEN)  1 packet Per Tube BID BM   mouth rinse  15 mL Mouth Rinse Q2H   pantoprazole  (PROTONIX ) IV  40 mg Intravenous Daily   sodium chloride  flush  10-40 mL Intracatheter Q12H   venlafaxine   37.5 mg Oral BID WC    Allergies: Allergies[1]  Family History  Problem Relation Age of Onset   Hypertension Mother    Ovarian cancer Mother    Diabetes type II Mother    Hypertension Father    Kidney disease Father    Hypertension Sister    Diabetes type II Maternal Grandmother    Breast cancer Maternal Grandmother    Breast cancer Maternal Aunt    Hypertension Other    Cancer Other    Renal Disease Other      Physical Exam: Vitals:   07/04/24 1130 07/04/24 1145 07/04/24 1200 07/04/24 1215  BP: (!) 114/54  106/65 (!) 118/51  Pulse: 60 72 63 72  Resp: (!) 0 18 (!) 21 20  Temp:      TempSrc:      SpO2: 95% (!) 89% 90% 91%  Weight:      Height:        GEN- intubated, sedated. HEENT: Normocephalic, atraumatic. NG in place Lungs- CTAB Heart- irregular rate and rhythm, No M/G/R.  GI- distended, firm.  Extremities- warm   Radiology/Studies: DG Abd 1 View Result Date: 07/02/2024 EXAM: 1 or 2 VIEW XRAY OF THE ABDOMEN 07/02/2024 09:53:25 PM COMPARISON: None available. CLINICAL HISTORY: Encounter for orogastric (OG) tube placement FINDINGS: LINES, TUBES AND DEVICES: Enteric tube tip is in the body of the stomach. The distal 5 cm of the tube is folded upon itself/kinked.  Vascular stent in  place overlying the expected region of the left common iliac vein. Cardiac paddles noted overlying the left hemiabdomen. BOWEL: Paucity of bowel gas. SOFT TISSUES: Vascular calcifications are seen in the abdomen and pelvis. BONES: No acute fracture. IMPRESSION: 1. Enteric tube tip in the body of the stomach, with the distal 5 cm folded upon itself and kinked. Electronically signed by: Greig Pique MD 07/02/2024 10:02 PM EST RP Workstation: [id number]   CT CERVICAL SPINE WO CONTRAST Result Date: 07/02/2024 EXAM: CT CERVICAL SPINE WITHOUT CONTRAST 07/02/2024 02:47:57 PM TECHNIQUE: CT of the cervical spine was performed without the administration of intravenous contrast. Multiplanar reformatted images are provided for review. Automated exposure control, iterative reconstruction, and/or weight based adjustment of the mA/kV was utilized to reduce the radiation dose to as low as reasonably achievable. COMPARISON: None available. CLINICAL HISTORY: Acute severe posterior neck pain. FINDINGS: BONES AND ALIGNMENT: Interval collapse of the superior endplate of C6 and inferior endplate of C5 and exaggerated kyphosis may reflect marked progression of degenerative change. Sequelae of infection could have a similar appearance. Slight retrolisthesis at C5-C6, C6-C7 and C7-T1 are similar to the prior exam. DEGENERATIVE CHANGES: Ankylosis is present across the disc space anteriorly. Moderate irregular endplate changes are present at C6-C7 without loss of height. Mild endplate changes are present at C4-C5 without loss of height. The endplate collapse at C5-C6 is also a degenerative change. SOFT TISSUES: No prevertebral soft tissue swelling. Atherosclerotic changes are present in the proximal great vessels and at the carotid bifurcations bilaterally without definite stenosis. IMPRESSION: 1. Interval collapse of the superior endplate of C6 and inferior endplate of C5 with exaggerated kyphosis, which may reflect  marked progression of degenerative change or sequelae of infection. MRI of the cervical spine without and with contrast may be useful for further evaluation. 2. Progressive degenerative endplate changes at C4-5 and C6-7 Electronically signed by: Lonni Necessary MD 07/02/2024 04:00 PM EST RP Workstation: [id number]   US  ASCITES (ABDOMEN LIMITED) Result Date: 07/02/2024 EXAM: LIMITED ABDOMINAL ULTRASOUND FOR ASCITES EVALUATION TECHNIQUE: Limited real-time sonography of all 4 quadrants of the abdomen was performed for evaluation of ascites. COMPARISON: US  Abdomen Limited 03/14/2024; 04/30/2023. CLINICAL HISTORY: Cirrhosis (HCC). FINDINGS: RIGHT UPPER QUADRANT: Moderate volume of ascites noted. LEFT UPPER QUADRANT: Moderate volume of ascites noted. RIGHT LOWER QUADRANT: Moderate volume of ascites noted. LEFT LOWER QUADRANT: Minimal fluid in the left lower quadrant. OTHER: Moderate fluid midline. Limited visualization of the rest of the abdomen demonstrates no acute abnormality. IMPRESSION: 1. Moderate volume of ascites in the right upper quadrant, right lower quadrant, left upper quadrant, and midline. 2.  minimal fluid in the left lower quadrant. Electronically signed by: Norleen Boxer MD 07/02/2024 03:47 PM EST RP Workstation: [id number]   CT Cervical Spine Wo Contrast Result Date: 07/01/2024 EXAM: CT CERVICAL SPINE WITHOUT CONTRAST 07/01/2024 03:13:30 PM TECHNIQUE: CT of the cervical spine was performed without the administration of intravenous contrast. Multiplanar reformatted images are provided for review. Automated exposure control, iterative reconstruction, and/or weight based adjustment of the mA/kV was utilized to reduce the radiation dose to as low as reasonably achievable. COMPARISON: 06/22/2024 and 02/16/2022 CLINICAL HISTORY: Ataxia, cervical trauma FINDINGS: BONES AND ALIGNMENT: No acute fracture or traumatic malalignment. Focal kyphosis at C5-C6 is stable. Straightening of the normal cervical  lordosis otherwise is stable. DEGENERATIVE CHANGES: Chronic endplate changes at C5-C6. SOFT TISSUES: Adjacent soft tissues are unremarkable. Calcifications are again seen within the thyroid  bilaterally. Atherosclerotic calcifications are present in the proximal great vessels and at the  carotid bifurcations without definite stenosis. A right IJ catheter is in place. No prevertebral soft tissue swelling. IMPRESSION: 1. No acute cervical spine findings. 2. Stable chronic endplate changes and focal kyphosis at C5-6. Electronically signed by: Lonni Necessary MD 07/01/2024 03:57 PM EST RP Workstation: [id number]   CT HEAD WO CONTRAST ( ) Result Date: 07/01/2024 EXAM: CT HEAD WITHOUT CONTRAST 07/01/2024 03:13:30 PM TECHNIQUE: CT of the head was performed without the administration of intravenous contrast. Automated exposure control, iterative reconstruction, and/or weight based adjustment of the mA/kV was utilized to reduce the radiation dose to as low as reasonably achievable. COMPARISON: 06/22/2024 CLINICAL HISTORY: Head trauma, abnormal mental status (Age 65-66y). Fall today. FINDINGS: BRAIN AND VENTRICLES: No acute hemorrhage. No evidence of acute infarct. Chronic encephalomalacia within bilateral frontal, parietal, and right occipital lobes compatible with remote infarcts. Extensive periventricular and subcortical white matter low-density changes compatible with chronic microvascular ischemic change. No hydrocephalus. No extra-axial collection. No mass effect or midline shift. ORBITS: No acute abnormality. SINUSES: No acute abnormality. SOFT TISSUES AND SKULL: No acute soft tissue abnormality. No skull fracture. Atherosclerotic calcifications in large vessels of skull base. IMPRESSION: 1. No acute intracranial abnormality. 2. Chronic encephalomalacia within bilateral frontal, parietal, and right occipital lobes compatible with remote infarcts. 3. Extensive periventricular and subcortical white matter  low-density changes compatible with chronic microvascular ischemic change. Electronically signed by: Lonni Necessary MD 07/01/2024 03:54 PM EST RP Workstation: [id number]   DG Wrist Complete Right Result Date: 07/01/2024 EXAM: 3 or more VIEW(S) XRAY OF THE RIGHT WRIST 07/01/2024 12:22:33 PM COMPARISON: None available. CLINICAL HISTORY: fall FINDINGS: BONES AND JOINTS: No acute fracture. No malalignment. Subchondral cysts in the base of the first metacarpal bone with degenerative changes of the first [facility] joint. SOFT TISSUES: Unremarkable. IMPRESSION: 1. No acute fracture or dislocation. Electronically signed by: Waddell Calk MD 07/01/2024 12:51 PM EST RP Workstation: [id number]   DG Pelvis Portable Result Date: 07/01/2024 EXAM: 1 or 2 VIEW(S) XRAY OF THE PELVIS 07/01/2024 12:22:33 PM COMPARISON: 01/13/2016 CLINICAL HISTORY: fall FINDINGS: BONES AND JOINTS: No acute fracture. No malalignment. SOFT TISSUES: Likely calcified uterine fibroids. Severe atherosclerotic plaque. IMPRESSION: 1. No evidence of acute traumatic injury. Electronically signed by: Waddell Calk MD 07/01/2024 12:49 PM EST RP Workstation: [id number]   DG Chest Portable 1 View Result Date: 07/01/2024 EXAM: 1 VIEW(S) XRAY OF THE CHEST 07/01/2024 11:45:00 AM COMPARISON: 06/25/2024 CLINICAL HISTORY: SOB FINDINGS: LINES, TUBES AND DEVICES: Right chest wall dialysis catheter in place with tip overlying right atrium. Left brachial stent noted. LUNGS AND PLEURA: Pulmonary interstitial prominence with mild pulmonary edema. Decreased left pleural effusion. No pneumothorax. HEART AND MEDIASTINUM: Cardiomegaly. Coronary artery calcifications and stents. Surgical changes in mediastinum. Aortic calcification. BONES AND SOFT TISSUES: Intact sternotomy wires. IMPRESSION: 1. Pulmonary interstitial prominence with mild pulmonary edema, similar . 2. Decreased left pleural effusion. 3. Cardiomegaly. Electronically signed by: Waddell Calk MD 07/01/2024  11:55 AM EST RP Workstation: [id number]   DG Chest 1 View Result Date: 06/25/2024 CLINICAL DATA:  Pulmonary edema. EXAM: CHEST  1 VIEW COMPARISON:  02/28/2024 and CT chest 02/16/2022. FINDINGS: Right IJ dialysis catheter terminates in the right atrium. Trachea is midline. Heart is enlarged. There may be subtle interstitial prominence bilaterally. Probable left pleural effusion. Findings appear unchanged. IMPRESSION: Probable mild interstitial pulmonary edema and small left pleural effusion. Electronically Signed   By: Newell Eke M.D.   On: 06/25/2024 11:11   ECHOCARDIOGRAM COMPLETE Result Date: 06/23/2024    ECHOCARDIOGRAM REPORT   Patient Name:  [name] Date of Exam: 06/23/2024 Medical Rec #:  [id number]         Height:       66.0 in Accession #:    [id number]        Weight:       164.5 lb Date of Birth:  09-01-1960          BSA:          1.840 m Patient Age:    63 years          BP:           101/73 mmHg Patient Gender: F                 HR:           107 bpm. Exam Location:  [facility] Procedure: 2D Echo, Color Doppler and Cardiac Doppler (Both Spectral and Color            Flow Doppler were utilized during procedure). STAT ECHO Indications:     Syncope R55  History:         Patient has prior history of Echocardiogram examinations, most                  recent 05/17/2023. COPD; Arrythmias:Atrial Fibrillation and                  Atrial Flutter.  Sonographer:     Christopher Furnace Referring Phys:  [id number] ROBET KIM Diagnosing Phys: Dwayne D Callwood MD IMPRESSIONS  1. Left ventricular ejection fraction, by estimation, is 25 to 30%. The left ventricle has severely decreased function. The left ventricle demonstrates global hypokinesis. The left ventricular internal cavity size was severely dilated. Left ventricular diastolic function could not be evaluated.  2. Right ventricular systolic function is severely reduced. The right ventricular size is severely enlarged. Mildly increased right ventricular wall  thickness.  3. Left atrial size was mildly dilated.  4. Right atrial size was severely dilated.  5. The mitral valve is normal in structure. Mild mitral valve regurgitation.  6. Tricuspid valve regurgitation is severe.  7. The aortic valve is grossly normal. Aortic valve regurgitation is not visualized. Aortic valve sclerosis is present, with no evidence of aortic valve stenosis. FINDINGS  Left Ventricle: Left ventricular ejection fraction, by estimation, is 25 to 30%. The left ventricle has severely decreased function. The left ventricle demonstrates global hypokinesis. Strain was performed and the global longitudinal strain is indeterminate. The left ventricular internal cavity size was severely dilated. There is borderline left ventricular hypertrophy. Left ventricular diastolic function could not be evaluated. Right Ventricle: The right ventricular size is severely enlarged. Mildly increased right ventricular wall thickness. Right ventricular systolic function is severely reduced. Left Atrium: Left atrial size was mildly dilated. Right Atrium: Right atrial size was severely dilated. Pericardium: There is no evidence of pericardial effusion. Mitral Valve: The mitral valve is normal in structure. Mild mitral valve regurgitation. Tricuspid Valve: The tricuspid valve is grossly normal. Tricuspid valve regurgitation is severe. Aortic Valve: The aortic valve is grossly normal. Aortic valve regurgitation is not visualized. Aortic valve sclerosis is present, with no evidence of aortic valve stenosis. Aortic valve mean gradient measures 3.0 mmHg. Aortic valve peak gradient measures 4.4 mmHg. Aortic valve area, by VTI measures 2.08 cm. Pulmonic Valve: The pulmonic valve was not well visualized. Pulmonic valve regurgitation is mild. Aorta: The aortic root was not well visualized. IAS/Shunts: No atrial level shunt detected by color flow Doppler.  Additional Comments: 3D was performed not requiring image post processing on  an independent workstation and was indeterminate.  LEFT VENTRICLE PLAX 2D LVIDd:         6.02 cm      Diastology LVIDs:         4.64 cm      LV e' medial:  4.64 cm/s LV PW:         1.11 cm      LV e' lateral: 8.32 cm/s LV IVS:        1.31 cm LVOT diam:     2.00 cm LV SV:         28 LV SV Index:   15 LVOT Area:     3.14 cm  LV Volumes (MOD) LV vol d, MOD A2C: 172.0 ml LV vol d, MOD A4C: 173.0 ml LV vol s, MOD A2C: 168.0 ml LV vol s, MOD A4C: 119.0 ml LV SV MOD A2C:     4.0 ml LV SV MOD A4C:     173.0 ml LV SV MOD BP:      33.8 ml RIGHT VENTRICLE RV Basal diam:  4.52 cm RV Mid diam:    3.20 cm LEFT ATRIUM             Index        RIGHT ATRIUM           Index LA diam:        3.40 cm 1.85 cm/m   RA Area:     28.70 cm LA Vol (A2C):   86.5 ml 47.00 ml/m  RA Volume:   110.00 ml 59.77 ml/m LA Vol (A4C):   67.8 ml 36.84 ml/m LA Biplane Vol: 78.1 ml 42.44 ml/m  AORTIC VALVE AV Area (Vmax):    1.81 cm AV Area (Vmean):   1.64 cm AV Area (VTI):     2.08 cm AV Vmax:           105.00 cm/s AV Vmean:          79.900 cm/s AV VTI:            0.133 m AV Peak Grad:      4.4 mmHg AV Mean Grad:      3.0 mmHg LVOT Vmax:         60.60 cm/s LVOT Vmean:        41.700 cm/s LVOT VTI:          0.088 m LVOT/AV VTI ratio: 0.66  AORTA Ao Root diam: 2.30 cm TRICUSPID VALVE TR Peak grad:   45.7 mmHg TR Vmax:        338.00 cm/s  SHUNTS Systemic VTI:  0.09 m Systemic Diam: 2.00 cm [name] MD Electronically signed by [name] MD Signature Date/Time: 06/23/2024/2:44:50 PM    Final    CT Cervical Spine Wo Contrast Result Date: 06/22/2024 EXAM: CT CERVICAL SPINE WITHOUT CONTRAST 06/22/2024 09:55:46 PM TECHNIQUE: CT of the cervical spine was performed without the administration of intravenous contrast. Multiplanar reformatted images are provided for review. Automated exposure control, iterative reconstruction, and/or weight based adjustment of the mA/kV was utilized to reduce the radiation dose to as low as reasonably  achievable. COMPARISON: None available. CLINICAL HISTORY: Polytrauma, blunt. FINDINGS: BONES AND ALIGNMENT: Reversal of the normal mid cervical lordosis, chronic. No acute fracture or traumatic malalignment. DEGENERATIVE CHANGES: Severe erosive endplate changes at C5-C6, chronic but progressive when compared to remote prior. Differential considerations include chronic osteomyelitis versus severe  degenerative changes with bony remodeling. SOFT TISSUES: No prevertebral soft tissue swelling. IMPRESSION: 1. No traumatic injury to the cervical spine. 2. Severe erosive endplate changes at C5-C6, chronic but progressive. Differential considerations include chronic osteomyelitis versus severe degenerative changes with bony remodeling. Correlate with laboratory evaluation to exclude infection. Electronically signed by: Pinkie Pebbles MD MD 06/22/2024 10:07 PM EST RP Workstation: [id number]   CT HEAD WO CONTRAST ( ) Result Date: 06/22/2024 EXAM: CT HEAD WITHOUT CONTRAST 06/22/2024 09:55:46 PM TECHNIQUE: CT of the head was performed without the administration of intravenous contrast. Automated exposure control, iterative reconstruction, and/or weight based adjustment of the mA/kV was utilized to reduce the radiation dose to as low as reasonably achievable. COMPARISON: MRI brain dated 08/16/2023. CLINICAL HISTORY: Polytrauma, blunt. FINDINGS: BRAIN AND VENTRICLES: No acute hemorrhage. No evidence of acute infarct. Remote infarcts in bilateral frontal lobes and right parietal lobe. Moderate white matter disease. Intracranial atherosclerosis. No hydrocephalus. No extra-axial collection. No mass effect or midline shift. ORBITS: No acute abnormality. SINUSES: No acute abnormality. SOFT TISSUES AND SKULL: No acute soft tissue abnormality. No skull fracture. IMPRESSION: 1. No acute intracranial abnormality. 2. 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 Electronically signed by: Pinkie Pebbles MD MD 06/22/2024 10:03 PM EST RP  Workstation: [id number]   DG Chest Portable 1 View Result Date: 06/22/2024 EXAM: 1 VIEW XRAY OF THE CHEST 06/22/2024 09:30:41 PM COMPARISON: 02/28/2024. Prior CAG. CLINICAL HISTORY: weakness FINDINGS: LINES, TUBES AND DEVICES: Right dialysis catheter tip in the right atrium. LUNGS AND PLEURA: Vascular congestion. No overt edema. No focal pulmonary opacity. No pleural effusion. No pneumothorax. HEART AND MEDIASTINUM: Cardiomegaly. BONES AND SOFT TISSUES: No acute osseous abnormality. IMPRESSION: 1. Cardiomegaly with vascular congestion without overt edema. Electronically signed by: Franky Crease MD 06/22/2024 09:35 PM EST RP Workstation: [id number]    EKG: 07/03/2024 - AF with frequent PVC and brief NSVT, rate 85 (personally   07/01/2024 - SR w sinus arrhythmia at 85, RBBB; QTC reviewed)  TELEMETRY:  07/02/24 episode - PVC initiated VT, then to TdP for a few seconds, then VF  Today - frequent brief NSVT episodes with frequent PVCs and ?afib vs PACs (personally reviewed)  DEVICE HISTORY: none  Assessment/Plan: #) HFrEF #) CAD s/p CABG #) VT, VF, TdP #) parox AFib #) ESRD on HD #) syncope Patient presented to [facility] ER after being found on floor in kitchen by family member. Initially she was maintaining sinus rhythm. After dialysis on 1/18 while in her hospital room, she had ventricular arrhythmia requiring code blue, chest compressions, shock x 1. She was intubated, sedated and transferred to ICU. Started on IV amiodarone . She has not had any further sustained VT, though continues to have very frequent runs of NSVT. Continue amiodarone  Start IV lidocaine  Keep K > 4, Mag ~3 Hold home eliquis , continue IV heparin  Consider ischemic eval if has neuro recovery, will defer to gen cards Her prognosis is quite guarded with multi-organ failure Strongly recommend engaging palliative care for GOC discussion with family  Do not recommend ICD implant unless has full recovery.         For  questions or updates, please contact [facility] Please consult [email or web address] for contact info under      Signed, Dreyson Mishkin, NP  07/04/2024 1:11 PM          [1]  Allergies Allergen Reactions   Morphine  And Codeine Hives   Levaquin [Levofloxacin] Itching and Other (See Comments)    Severe itching; prickly sensation; severe left leg  pain; immobility.   Enalapril  Other (See Comments)    hallucinations   Latex Rash   Tape Rash and Other (See Comments)   "

## 2024-07-04 NOTE — Progress Notes (Signed)
 PHARMACY - ANTICOAGULATION CONSULT NOTE  Pharmacy Consult for UFH infusion Indication: atrial fibrillation  Allergies[1]  Patient Measurements: Height: 5' 6 (167.6 cm) Weight: 74.7 kg (164 lb 10.9 oz) IBW/kg (Calculated) : 59.3 HEPARIN  DW (KG): 70.5  Vital Signs: Temp: 98.6 F (37 C) (01/20 0400) Temp Source: Axillary (01/20 0400) BP: 113/59 (01/20 0600) Pulse Rate: 82 (01/20 0600)  Labs: Recent Labs    07/02/24 1646 07/03/24 0543 07/03/24 1525 07/04/24 0441  HGB 12.2 12.4  --  12.1  HCT 37.3 37.9  --  35.8*  PLT 70* 63*  --  73*  APTT  --  102* 69* 53*  HEPARINUNFRC  --  >1.10*  --  >1.10*  CREATININE 3.57* 4.09*  --  3.18*    Estimated Creatinine Clearance: 19.2 mL/min (A) (by C-G formula based on SCr of 3.18 mg/dL (H)).   Medical History: Past Medical History:  Diagnosis Date   Acute lacunar infarction (HCC) 03/03/2014   a.) MRI brain 03/03/2014: two acute punctate lacunar infarcts anteriorly in the BILATERAL centrum semiovale   Adult behavior problems    Anemia of chronic renal failure    Aortic atherosclerosis    Arthritis    Atrial fibrillation and flutter (HCC)    a.) CHA2DS2VASc = 6 (sex, HFrEF, HTN, CVA x 2, prior MI/vascular disease) as of 02/04/2024; b.) rate/rhythm maintained on oral metoprolol  succinate; chronically anticoagulated with apixaban    Bipolar disorder (HCC)    Cardiomegaly    Cerebral microvascular disease    CHF (congestive heart failure) (HCC)    Chicken pox    Cholelithiasis    Chronic respiratory failure with hypoxia (HCC) 12/19/2021   COPD (chronic obstructive pulmonary disease) (HCC)    Coronary artery disease    DDD (degenerative disc disease), cervical    DDD (degenerative disc disease), thoracolumbar    Diverticulosis    Dyspnea    ESRD (end stage renal disease) on dialysis (M,W,F)    GERD (gastroesophageal reflux disease)    Gout    HCV (hepatitis C virus)    Headache    HFrEF (heart failure with reduced ejection  fraction) (HCC)    History of delirium 04/12/2014   History of substance abuse (HCC)    a.) tobacco + cocaine   Hyperkalemia 11/13/2014   Hyperlipidemia    Hypertension    Hypotension 02/24/2014   Hypothyroidism    Ischemic cardiomyopathy    Nose colonized with MRSA 02/01/2024   a.) presurgical PCR (+) on 02/01/2024 prior to A/V FISTULAGRAM; b.) presurgical PCR (+) on 03/07/2024 prior to AV FISTULA CREATION   NSTEMI (non-ST elevated myocardial infarction) (HCC) 10/08/2013   NSTEMI (non-ST elevated myocardial infarction) (HCC) 01/23/2002   a.) LHC 01/24/2002 --> multi-vessel CAD with IRA being 90% OM2 --> delayed PCI until 01/25/2002 --> 3.0 x 23 mm BX Velocity stent   Obesity    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    On apixaban  therapy    Peripheral vascular disease    Pneumonia    Postoperative anemia due to acute blood loss 02/27/2014   Postoperative cerebrovascular infarction following cardiac surgery 02/21/2014   a.) developed RIGHT upper/lower extremity weakness following cardiac revascularization --> neuro consult and head CT --> Hypodensity noted in the area of the LEFT motor cortex consistent with a component of acute ischemia   Pulmonary hypertension (HCC)    RBBB (right bundle branch block)    Renal osteodystrophy    S/P CABG x 3 02/20/2014   a.) LIMA-LAD,  SVG-OM1, SVG-PDA   Stroke (HCC) 05/23/2010   a.) brain MRI 05/23/2010: BILATERAL acute infarcts and old prior infarcts; RIGHT frontal lobe extending into the white matter. Small focus of restricted diffusion in the cortex of the medial LEFT frontoparietal lobe. Periventricular T2 and multiple foci of T2 hyperintensity in the subcortical white matter and increased FLAIR signal in the cortex of the LEFT frontal lobe.   Syncope and collapse 07/25/2020    Medications:  Scheduled:   Chlorhexidine  Gluconate Cloth  6 each Topical Q0600   collagenase   1 Application Topical Daily   feeding supplement (PROSource  TF20)  60 mL Per Tube Daily   free water   30 mL Per Tube Q4H   levothyroxine   75 mcg Oral Q0600   midodrine   10 mg Oral TID WC   multivitamin  1 tablet Per Tube QHS   nutrition supplement (JUVEN)  1 packet Per Tube BID BM   mouth rinse  15 mL Mouth Rinse Q2H   pantoprazole   40 mg Oral BH-q7a   sodium chloride  flush  10-40 mL Intracatheter Q12H   venlafaxine   37.5 mg Oral BID WC    Assessment: 64 y/o F with a h/o atrial fibrillation on apixaban . Last dose was today at 0900. Patient coded on 07-26-2024 with subsequent ROSC and is now in the ICU on ventilation and pressor support. Pharmacy consulted for IV heparin .   Goal of Therapy:  Heparin  level 0.3-0.7 units/ml aPTT 66-102 seconds Monitor platelets by anticoagulation protocol: Yes   Plan: aPTT remains subtherapeutic despite recent rate adjustments --will order bolus 1050 units IV heparin  x 1 --Increase heparin  infusion to 1300 units/hr --Recheck aPTT in 8 hrs after rate change --Transition to anti-Xa levels when correlating --Daily anti-Xa/CBC  Adriana Bolster, PharmD, BCPS 07/04/2024 7:04 AM            [1]  Allergies Allergen Reactions   Morphine  And Codeine Hives   Levaquin [Levofloxacin] Itching and Other (See Comments)    Severe itching; prickly sensation; severe left leg pain; immobility.   Enalapril  Other (See Comments)    hallucinations   Latex Rash   Tape Rash and Other (See Comments)

## 2024-07-04 NOTE — Plan of Care (Signed)
 Remains intubated/sedated in Afib. Heparin . Amio and Propofol  continue. Intermittent Levophed  needed to maintain MAP > 65. Vaso titrated off per orders by 2150. Tube feeds titrated per goal. Pt anuric at baseline. Bed in lowest position with bed alarm on.   Problem: Education: Goal: Knowledge of General Education information will improve Description: Including pain rating scale, medication(s)/side effects and non-pharmacologic comfort measures Outcome: Not Progressing   Problem: Clinical Measurements: Goal: Ability to maintain clinical measurements within normal limits will improve Outcome: Progressing Goal: Respiratory complications will improve Outcome: Not Progressing Goal: Cardiovascular complication will be avoided Outcome: Not Progressing   Problem: Nutrition: Goal: Adequate nutrition will be maintained Outcome: Progressing   Problem: Elimination: Goal: Will not experience complications related to bowel motility Outcome: Not Progressing Goal: Will not experience complications related to urinary retention Outcome: Progressing

## 2024-07-05 DIAGNOSIS — F17210 Nicotine dependence, cigarettes, uncomplicated: Secondary | ICD-10-CM

## 2024-07-05 DIAGNOSIS — N186 End stage renal disease: Secondary | ICD-10-CM

## 2024-07-05 DIAGNOSIS — J810 Acute pulmonary edema: Secondary | ICD-10-CM

## 2024-07-05 DIAGNOSIS — W19XXXA Unspecified fall, initial encounter: Secondary | ICD-10-CM | POA: Diagnosis not present

## 2024-07-05 DIAGNOSIS — Z992 Dependence on renal dialysis: Secondary | ICD-10-CM

## 2024-07-05 DIAGNOSIS — R7881 Bacteremia: Secondary | ICD-10-CM | POA: Diagnosis not present

## 2024-07-05 DIAGNOSIS — I469 Cardiac arrest, cause unspecified: Secondary | ICD-10-CM

## 2024-07-05 DIAGNOSIS — I255 Ischemic cardiomyopathy: Secondary | ICD-10-CM

## 2024-07-05 DIAGNOSIS — B954 Other streptococcus as the cause of diseases classified elsewhere: Secondary | ICD-10-CM

## 2024-07-05 DIAGNOSIS — B9562 Methicillin resistant Staphylococcus aureus infection as the cause of diseases classified elsewhere: Secondary | ICD-10-CM | POA: Diagnosis not present

## 2024-07-05 DIAGNOSIS — R55 Syncope and collapse: Secondary | ICD-10-CM | POA: Diagnosis not present

## 2024-07-05 DIAGNOSIS — I493 Ventricular premature depolarization: Secondary | ICD-10-CM | POA: Diagnosis not present

## 2024-07-05 LAB — APTT
aPTT: 78 s — ABNORMAL HIGH (ref 24–36)
aPTT: 86 s — ABNORMAL HIGH (ref 24–36)

## 2024-07-05 LAB — CBC
HCT: 32.6 % — ABNORMAL LOW (ref 36.0–46.0)
Hemoglobin: 11.2 g/dL — ABNORMAL LOW (ref 12.0–15.0)
MCH: 30.9 pg (ref 26.0–34.0)
MCHC: 34.4 g/dL (ref 30.0–36.0)
MCV: 89.8 fL (ref 80.0–100.0)
Platelets: 68 K/uL — ABNORMAL LOW (ref 150–400)
RBC: 3.63 MIL/uL — ABNORMAL LOW (ref 3.87–5.11)
RDW: 17.7 % — ABNORMAL HIGH (ref 11.5–15.5)
WBC: 13.7 K/uL — ABNORMAL HIGH (ref 4.0–10.5)
nRBC: 0.4 % — ABNORMAL HIGH (ref 0.0–0.2)

## 2024-07-05 LAB — BODY FLUID CELL COUNT WITH DIFFERENTIAL
Eos, Fluid: 0 %
Lymphs, Fluid: 51 %
Monocyte-Macrophage-Serous Fluid: 5 % — ABNORMAL LOW (ref 50–90)
Neutrophil Count, Fluid: 44 % — ABNORMAL HIGH (ref 0–25)
Total Nucleated Cell Count, Fluid: 609 uL (ref 0–1000)

## 2024-07-05 LAB — GLUCOSE, CAPILLARY
Glucose-Capillary: 77 mg/dL (ref 70–99)
Glucose-Capillary: 81 mg/dL (ref 70–99)
Glucose-Capillary: 63 mg/dL — ABNORMAL LOW (ref 70–99)
Glucose-Capillary: 74 mg/dL (ref 70–99)
Glucose-Capillary: 84 mg/dL (ref 70–99)
Glucose-Capillary: 92 mg/dL (ref 70–99)
Glucose-Capillary: 169 mg/dL — ABNORMAL HIGH (ref 70–99)

## 2024-07-05 LAB — RENAL FUNCTION PANEL
Albumin: 2.9 g/dL — ABNORMAL LOW (ref 3.5–5.0)
Anion gap: 15 (ref 5–15)
BUN: 36 mg/dL — ABNORMAL HIGH (ref 8–23)
CO2: 25 mmol/L (ref 22–32)
Calcium: 10.2 mg/dL (ref 8.9–10.3)
Chloride: 91 mmol/L — ABNORMAL LOW (ref 98–111)
Creatinine, Ser: 3.97 mg/dL — ABNORMAL HIGH (ref 0.44–1.00)
GFR, Estimated: 12 mL/min — ABNORMAL LOW
Glucose, Bld: 79 mg/dL (ref 70–99)
Phosphorus: 2.8 mg/dL (ref 2.5–4.6)
Potassium: 3.7 mmol/L (ref 3.5–5.1)
Sodium: 131 mmol/L — ABNORMAL LOW (ref 135–145)

## 2024-07-05 LAB — MAGNESIUM: Magnesium: 2.4 mg/dL (ref 1.7–2.4)

## 2024-07-05 LAB — HEPARIN LEVEL (UNFRACTIONATED): Heparin Unfractionated: 0.82 [IU]/mL — ABNORMAL HIGH (ref 0.30–0.70)

## 2024-07-05 LAB — PROTIME-INR
INR: 1.2 (ref 0.8–1.2)
Prothrombin Time: 16.3 s — ABNORMAL HIGH (ref 11.4–15.2)

## 2024-07-05 MED ORDER — HEPARIN SODIUM (PORCINE) 1000 UNIT/ML DIALYSIS
1000.0000 [IU] | INTRAMUSCULAR | Status: DC | PRN
  Administered 2024-07-05 (×5): 1000 [IU]

## 2024-07-05 MED ORDER — HEPARIN SODIUM (PORCINE) 1000 UNIT/ML IJ SOLN
INTRAMUSCULAR | Status: AC
  Filled 2024-07-05: qty 8

## 2024-07-05 MED ORDER — VITAL AF 1.2 CAL PO LIQD
1000.0000 mL | ORAL | Status: DC
  Administered 2024-07-05 – 2024-07-09 (×5): 1000 mL

## 2024-07-05 MED ORDER — ALTEPLASE 2 MG IJ SOLR: 2.0000 mg | Freq: Once | INTRAMUSCULAR | Status: DC | PRN

## 2024-07-05 MED ORDER — CHLORHEXIDINE GLUCONATE CLOTH 2 % EX PADS
6.0000 | MEDICATED_PAD | Freq: Every day | CUTANEOUS | Status: AC
  Administered 2024-07-05 – 2024-07-21 (×17): 6 via TOPICAL

## 2024-07-05 MED ORDER — ALBUMIN HUMAN 25 % IV SOLN
12.5000 g | Freq: Once | INTRAVENOUS | Status: AC
  Administered 2024-07-05: 12.5 g via INTRAVENOUS
  Filled 2024-07-05: qty 50

## 2024-07-05 MED ORDER — POTASSIUM CHLORIDE 20 MEQ PO PACK
20.0000 meq | PACK | Freq: Once | ORAL | Status: AC
  Administered 2024-07-05: 20 meq
  Filled 2024-07-05: qty 1

## 2024-07-05 MED ORDER — AMIODARONE HCL IN DEXTROSE 360-4.14 MG/200ML-% IV SOLN
30.0000 mg/h | INTRAVENOUS | Status: DC
  Administered 2024-07-06 – 2024-07-08 (×5): 30 mg/h via INTRAVENOUS
  Filled 2024-07-05 (×6): qty 200

## 2024-07-05 MED ORDER — POTASSIUM CHLORIDE CRYS ER 20 MEQ PO TBCR: 20.0000 meq | EXTENDED_RELEASE_TABLET | Freq: Once | ORAL | Status: DC

## 2024-07-05 NOTE — Consult Note (Signed)
 NAME: [name]  DOB: 12-11-1960  MRN: [id number]  Date/Time: 07/05/2024 1:24 PM  REQUESTING PROVIDER: Michaelene Subjective:  REASON FOR CONSULT: Strep Gordonii bacteremia ? [name] is a 64 y.o. female with a history of ESRD, CAD s/p CABG, Afib ICM, h/o pericardial effusion h/o PEA arrest , steal syndrome rt arm with ligation of rt brachial artery to axillary vein graft on 04/20/24 recently in the hospital between 06/22/24 until 06/28/24 for syncope, fluid over load, chronic hypotension, cardiogenic shock EF 25-30%, calciphylaxis presented to the ED on 07/01/24 with weakness and fall. She was found on the floor by her brother. Vitals in the ED  07/01/24 11:32  BP 116/55 !  Temp 98.2 F (36.8 C)  Pulse Rate 88  Resp 18  SpO2 100 %  Later temp 101.7  Latest Reference Range & Units 07/01/24 13:36  WBC 4.0 - 10.5 K/uL 12.0 (H)  Hemoglobin 12.0 - 15.0 g/dL 87.2  HCT 63.9 - 53.9 % 38.7  Platelets 150 - 400 K/uL 82 (L) [1]  Creatinine 0.44 - 1.00 mg/dL 4.92 (H)   She was sleepy in the ED but was arousable and was conversant as per the records  CT head chronic ischemic changes EKG RBBB Was seen by cardiologist and no invasive cardiac procedure was deemed necessary  On 1/18 afternoon she had V fib cardiac arrest and she was shocked  resuscitated, intubated and transferred to ICU, and started on amiodarone  Left femoral triple lumen inserted Blood culture was sent on 1/18 at 10 pm A ct cervical spine done on 1/18 afternoon showed collapse of the superior endplate of c6 and inferior end plate of C5 concerning for degenerative changes or sequelae of infection Seen by neurosurgeon and no surgical intervention recommended. Thought to be chronic progressing changes since 2022  I am seeing the patient for strep Gordonii in 1 set of blood culture  Pt is intubated and on pressors, propafol and amiodarone  No history or Review of systems available from patient  Past Medical History:   Diagnosis Date   Acute lacunar infarction (HCC) 03/03/2014   a.) MRI brain 03/03/2014: two acute punctate lacunar infarcts anteriorly in the BILATERAL centrum semiovale   Adult behavior problems    Anemia of chronic renal failure    Aortic atherosclerosis    Arthritis    Atrial fibrillation and flutter (HCC)    a.) CHA2DS2VASc = 6 (sex, HFrEF, HTN, CVA x 2, prior MI/vascular disease) as of 02/04/2024; b.) rate/rhythm maintained on oral metoprolol  succinate; chronically anticoagulated with apixaban    Bipolar disorder (HCC)    Cardiomegaly    Cerebral microvascular disease    CHF (congestive heart failure) (HCC)    Chicken pox    Cholelithiasis    Chronic respiratory failure with hypoxia (HCC) 12/19/2021   COPD (chronic obstructive pulmonary disease) (HCC)    Coronary artery disease    DDD (degenerative disc disease), cervical    DDD (degenerative disc disease), thoracolumbar    Diverticulosis    Dyspnea    ESRD (end stage renal disease) on dialysis (M,W,F)    GERD (gastroesophageal reflux disease)    Gout    HCV (hepatitis C virus)    Headache    HFrEF (heart failure with reduced ejection fraction) (HCC)    History of delirium 04/12/2014   History of substance abuse (HCC)    a.) tobacco + cocaine   Hyperkalemia 11/13/2014   Hyperlipidemia    Hypertension    Hypotension 02/24/2014   Hypothyroidism  Ischemic cardiomyopathy    Nose colonized with MRSA 02/01/2024   a.) presurgical PCR (+) on 02/01/2024 prior to A/V FISTULAGRAM; b.) presurgical PCR (+) on 03/07/2024 prior to AV FISTULA CREATION   NSTEMI (non-ST elevated myocardial infarction) (HCC) 10/08/2013   NSTEMI (non-ST elevated myocardial infarction) (HCC) 01/23/2002   a.) LHC 01/24/2002 --> multi-vessel CAD with IRA being 90% OM2 --> delayed PCI until 01/25/2002 --> 3.0 x 23 mm BX Velocity stent   Obesity    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    On apixaban  therapy    Peripheral vascular disease     Pneumonia    Postoperative anemia due to acute blood loss 02/27/2014   Postoperative cerebrovascular infarction following cardiac surgery 02/21/2014   a.) developed RIGHT upper/lower extremity weakness following cardiac revascularization --> neuro consult and head CT --> Hypodensity noted in the area of the LEFT motor cortex consistent with a component of acute ischemia   Pulmonary hypertension (HCC)    RBBB (right bundle branch block)    Renal osteodystrophy    S/P CABG x 3 02/20/2014   a.) LIMA-LAD, SVG-OM1, SVG-PDA   Stroke (HCC) 05/23/2010   a.) brain MRI 05/23/2010: BILATERAL acute infarcts and old prior infarcts; RIGHT frontal lobe extending into the white matter. Small focus of restricted diffusion in the cortex of the medial LEFT frontoparietal lobe. Periventricular T2 and multiple foci of T2 hyperintensity in the subcortical white matter and increased FLAIR signal in the cortex of the LEFT frontal lobe.   Syncope and collapse 07/25/2020    Past Surgical History:  Procedure Laterality Date   A/V FISTULAGRAM Left 03/30/2019   Procedure: A/V FISTULAGRAM;  Surgeon: Marea Selinda RAMAN, MD;  Location: [facility];  Service: Cardiovascular;  Laterality: Left;   A/V FISTULAGRAM Left 10/06/2019   Procedure: A/V FISTULAGRAM;  Surgeon: Marea Selinda RAMAN, MD;  Location: [facility];  Service: Cardiovascular;  Laterality: Left;   A/V FISTULAGRAM Left 05/19/2021   Procedure: A/V FISTULAGRAM;  Surgeon: Marea Selinda RAMAN, MD;  Location: [facility];  Service: Cardiovascular;  Laterality: Left;   A/V FISTULAGRAM Left 01/29/2022   Procedure: A/V Fistulagram;  Surgeon: Marea Selinda RAMAN, MD;  Location: [facility];  Service: Cardiovascular;  Laterality: Left;   A/V FISTULAGRAM Left 04/30/2022   Procedure: A/V Fistulagram;  Surgeon: Marea Selinda RAMAN, MD;  Location: [facility];  Service: Cardiovascular;  Laterality: Left;   A/V FISTULAGRAM Left 08/20/2022   Procedure: A/V  Fistulagram;  Surgeon: Marea Selinda RAMAN, MD;  Location: [facility];  Service: Cardiovascular;  Laterality: Left;   A/V FISTULAGRAM N/A 12/03/2022   Procedure: A/V Fistulagram;  Surgeon: Marea Selinda RAMAN, MD;  Location: [facility];  Service: Cardiovascular;  Laterality: N/A;   A/V FISTULAGRAM Left 02/11/2023   Procedure: A/V Fistulagram;  Surgeon: Marea Selinda RAMAN, MD;  Location: [facility];  Service: Cardiovascular;  Laterality: Left;   A/V FISTULAGRAM Left 02/07/2024   Procedure: A/V Fistulagram;  Surgeon: Marea Selinda RAMAN, MD;  Location: [facility];  Service: Cardiovascular;  Laterality: Left;   COLONOSCOPY     COLONOSCOPY WITH PROPOFOL  N/A 04/22/2021   Procedure: COLONOSCOPY WITH PROPOFOL ;  Surgeon: Maryruth Ole DASEN, MD;  Location: [facility];  Service: Endoscopy;  Laterality: N/A;   CORONARY ANGIOPLASTY WITH STENT PLACEMENT Left 01/25/2002   Procedure: CORONARY ANGIOPLASTY WITH STENT PLACEMENT; Location: [facility]; Surgeon: Margie Lovelace, MD   CORONARY ARTERY BYPASS GRAFT  N/A 02/20/2014   Procedure: CORONARY ARTERY BYPASS GRAFT; Location: [facility]; Surgeon: Reyes Fruits, MD   DIALYSIS FISTULA CREATION     DIALYSIS/PERMA CATHETER INSERTION N/A 04/08/2023   Procedure: DIALYSIS/PERMA CATHETER INSERTION;  Surgeon: Marea Selinda RAMAN, MD;  Location: [facility];  Service: Cardiovascular;  Laterality: N/A;   DIALYSIS/PERMA CATHETER REMOVAL N/A 07/06/2023   Procedure: DIALYSIS/PERMA CATHETER REMOVAL;  Surgeon: [name] Cordella MATSU, MD;  Location: [facility];  Service: Cardiovascular;  Laterality: N/A;   ESOPHAGOGASTRODUODENOSCOPY     ESOPHAGOGASTRODUODENOSCOPY N/A 04/22/2021   Procedure: ESOPHAGOGASTRODUODENOSCOPY (EGD);  Surgeon: Maryruth Ole DASEN, MD;  Location[facility];  Service: Endoscopy;  Laterality: N/A;   FLEXIBLE SIGMOIDOSCOPY N/A 02/23/2019   Procedure: FLEXIBLE SIGMOIDOSCOPY;  Surgeon: Gaylyn Gladis PENNER, MD;  Location: [facility] END[facility];   Service: Endoscopy;  Laterality: N/A;   IABP INSERTION  02/20/2014   Procedure: INTRA-AORTIC BALLOON PUMP INSERTION; Location: [facility]; Surgeon: Reyes Fruits, MD   INSERTION OF ARTERIOVENOUS (AV) ARTEGRAFT ARM Right 03/09/2024   Procedure: INSERTION, GRAFT, ARTERIOVENOUS, UPPER EXTREMITY;  Surgeon: Marea Selinda RAMAN, MD;  Location: [facility];  Service: Vascular;  Laterality: Right;  BRACHIAL AXILLARY   INSERTION OF DIALYSIS CATHETER N/A 04/20/2024   Procedure: INSERTION OF DIALYSIS CATHETER;  Surgeon: Marea Selinda RAMAN, MD;  Location: [facility];  Service: Vascular;  Laterality: N/A;   LEFT HEART CATH AND CORONARY ANGIOGRAPHY Left 01/24/2002   Procedure: LEFT HEART CATH AND CORONARY ANGIOGRAPHY; Location: [facility]; Surgeon: Margie Lovelace, MD   LEFT HEART CATH AND CORONARY ANGIOGRAPHY Left 05/25/2012   Procedure: LEFT HEART CATH AND CORONARY ANGIOGRAPHY; Location: [facility]; Surgeon: Margie Lovelace, MD   LEFT HEART CATH AND CORONARY ANGIOGRAPHY Left 10/09/2013   Procedure: LEFT HEART CATH AND CORONARY ANGIOGRAPHY; Location: [facility]; Surgeon: Vinie Jude, MD   LEFT HEART CATH AND CORS/GRAFTS ANGIOGRAPHY N/A 08/08/2018   Procedure: LEFT HEART CATH AND CORS/GRAFTS ANGIOGRAPHY;  Surgeon: Fernand Denyse LABOR, MD;  Location: [facility];  Service: Cardiovascular;  Laterality: N/A;   LEFT HEART CATH AND CORS/GRAFTS ANGIOGRAPHY N/A 01/30/2019   Procedure: LEFT HEART CATH AND CORS/GRAFTS ANGIOGRAPHY;  Surgeon: Fernand Denyse LABOR, MD;  Location: [facility];  Service: Cardiovascular;  Laterality: N/A;   LIGATION ARTERIOVENOUS GORTEX GRAFT Right 04/20/2024   Procedure: LIGATION ARTERIOVENOUS GORTEX GRAFT;  Surgeon: Marea Selinda RAMAN, MD;  Location: [facility];  Service: Vascular;  Laterality: Right;   LOWER EXTREMITY ANGIOGRAPHY Left 04/24/2024   Procedure: Lower Extremity Angiography;  Surgeon: Marea Selinda RAMAN, MD;  Location: [facility];  Service: Cardiovascular;  Laterality: Left;   ultrasound guided pericardiocentesis      UPPER EXTREMITY ANGIOGRAPHY Right 03/30/2024   Procedure: Upper Extremity Angiography;  Surgeon: Marea Selinda RAMAN, MD;  Location: [facility];  Service: Cardiovascular;  Laterality: Right;    Social History   Socioeconomic History   Marital status: Single    Spouse name: Not on file   Number of children: Not on file   Years of education: Not on file   Highest education level: Not on file  Occupational History   Not on file  Tobacco Use   Smoking status: Every Day    Current packs/day: 0.25    Average packs/day: 0.3 packs/day for 20.0 years (5.0 ttl pk-yrs)    Types: Cigarettes   Smokeless tobacco: Never   Tobacco comments:    smokes 1 - 1.5 cigarettes a day  Vaping Use   Vaping status: Never Used  Substance and Sexual Activity  Alcohol use: No    Comment: social   Drug use: No   Sexual activity: Not Currently    Birth control/protection: Post-menopausal  Other Topics Concern   Not on file  Social History Narrative   Lives alone   Social Drivers of Health   Tobacco Use: High Risk (07/01/2024)   Patient History    Smoking Tobacco Use: Every Day    Smokeless Tobacco Use: Never    Passive Exposure: Not on file  Financial Resource Strain: Low Risk  (05/31/2024)   Received from [facility]   Overall Financial Resource Strain (CARDIA)    Difficulty of Paying Living Expenses: Not hard at all  Food Insecurity: No Food Insecurity (07/02/2024)   Epic    Worried About Radiation Protection Practitioner of Food in the Last Year: Never true    Ran Out of Food in the Last Year: Never true  Transportation Needs: No Transportation Needs (07/02/2024)   Epic    Lack of Transportation (Medical): No    Lack of Transportation (Non-Medical): No  Physical Activity: Not on file  Stress: Not on file  Social Connections: Not on file  Intimate Partner Violence: Not At Risk (07/02/2024)   Epic    Fear of Current or Ex-Partner: No    Emotionally Abused: No    Physically Abused: No     Sexually Abused: No  Depression (PHQ2-9): Low Risk (07/06/2023)   Depression (PHQ2-9)    PHQ-2 Score: 3  Alcohol Screen: Not on file  Housing: Low Risk (07/02/2024)   Epic    Unable to Pay for Housing in the Last Year: No    Number of Times Moved in the Last Year: 0    Homeless in the Last Year: No  Utilities: Not At Risk (07/02/2024)   Epic    Threatened with loss of utilities: No  Health Literacy: Not on file    Family History  Problem Relation Age of Onset   Hypertension Mother    Ovarian cancer Mother    Diabetes type II Mother    Hypertension Father    Kidney disease Father    Hypertension Sister    Diabetes type II Maternal Grandmother    Breast cancer Maternal Grandmother    Breast cancer Maternal Aunt    Hypertension Other    Cancer Other    Renal Disease Other    Allergies[1] I? Current Facility-Administered Medications  Medication Dose Route Frequency Provider Last Rate Last Admin   acetaminophen  (TYLENOL ) tablet 650 mg  650 mg Oral Q6H PRN Paudel, Keshab, MD   650 mg at 07/01/24 2227   Or   acetaminophen  (TYLENOL ) suppository 650 mg  650 mg Rectal Q6H PRN Paudel, Nena, MD       alteplase  (CATHFLO ACTIVASE ) injection 2 mg  2 mg Intracatheter Once PRN Druscilla Bald, NP       Amiodarone  HCl 450 mg in dextrose  5 % 250 mL infusion  30 mg/hr Intravenous Continuous Clair Marolyn NOVAK, RPH 16.67 mL/hr at 07/05/24 0836 30 mg/hr at 07/05/24 0836   cefTRIAXone  (ROCEPHIN ) 2 g in sodium chloride  0.9 % 100 mL IVPB  2 g Intravenous Q24H Parris Manna, MD   Stopped at 07/04/24 2131   Chlorhexidine  Gluconate Cloth 2 % PADS 6 each  6 each Topical Daily Aleskerov, Fuad, MD   6 each at 07/05/24 9060   collagenase  (SANTYL ) ointment 1 Application  1 Application Topical Daily Paudel, Keshab, MD   1 Application at 07/05/24 [phone]  feeding supplement (PROSource TF20) liquid 60 mL  60 mL Per Tube Daily Parris Manna, MD   60 mL at 07/05/24 1100   feeding supplement (VITAL AF 1.2 CAL)  liquid 1,000 mL  1,000 mL Per Tube Continuous Parris Manna, MD   Stopping previously hung infusion at 07/04/24 1900   fentaNYL  (SUBLIMAZE ) injection 12.5 mcg  12.5 mcg Intravenous Q2H PRN Paudel, Keshab, MD   12.5 mcg at 07/03/24 0357   free water  30 mL  30 mL Per Tube Q4H Parris Manna, MD   30 mL at 07/05/24 1147   heparin  ADULT infusion 100 units/mL (25000 units/250mL)  1,300 Units/hr Intravenous Continuous Nada Adriana BIRCH, RPH 13 mL/hr at 07/05/24 0700 1,300 Units/hr at 07/05/24 0700   heparin  injection 1,000 Units  1,000 Units Intracatheter PRN Druscilla Bald, NP   1,000 Units at 07/05/24 1014   levothyroxine  (SYNTHROID ) tablet 75 mcg  75 mcg Oral Q0600 Paudel, Keshab, MD   75 mcg at 07/05/24 9376   metoprolol  tartrate (LOPRESSOR ) injection 5 mg  5 mg Intravenous Q5 min PRN Callwood, Dwayne D, MD   5 mg at 07/02/24 1747   midodrine  (PROAMATINE ) tablet 10 mg  10 mg Oral TID WC Paudel, Keshab, MD   10 mg at 07/05/24 1200   multivitamin (RENA-VIT) tablet 1 tablet  1 tablet Per Tube QHS Aleskerov, Fuad, MD   1 tablet at 07/04/24 2125   norepinephrine  (LEVOPHED ) 4mg  in (0.016 mg/mL) premix infusion  0-40 mcg/min Intravenous Titrated Aleskerov, Fuad, MD   Stopped at 07/04/24 2343   nutrition supplement (JUVEN) (JUVEN) powder packet 1 packet  1 packet Per Tube BID BM Aleskerov, Fuad, MD   1 packet at 07/05/24 1100   Oral care mouth rinse  15 mL Mouth Rinse Q2H Aleskerov, Fuad, MD   15 mL at 07/05/24 1129   Oral care mouth rinse  15 mL Mouth Rinse PRN Aleskerov, Fuad, MD       oxyCODONE  (Oxy IR/ROXICODONE ) immediate release tablet 5 mg  5 mg Oral Q4H PRN Paudel, Keshab, MD   5 mg at 07/02/24 1409   pantoprazole  (PROTONIX ) injection 40 mg  40 mg Intravenous Daily Aleskerov, Fuad, MD   40 mg at 07/05/24 0938   polyethylene glycol (MIRALAX  / GLYCOLAX ) packet 17 g  17 g Oral Daily PRN Paudel, Keshab, MD   17 g at 07/05/24 0450   potassium chloride  (KLOR-CON ) packet 20 mEq  20 mEq Per Tube Once  Nada Adriana BIRCH, RPH       propofol  (DIPRIVAN ) 1000 MG/100ML infusion  0-80 mcg/kg/min Intravenous Titrated Kandis Devaughn Sayres, MD 12.06 mL/hr at 07/05/24 0700 30 mcg/kg/min at 07/05/24 0700   sodium chloride  flush (NS) 0.9 % injection 10-40 mL  10-40 mL Intracatheter Q12H Parris Manna, MD   20 mL at 07/05/24 9060   sodium chloride  flush (NS) 0.9 % injection 10-40 mL  10-40 mL Intracatheter PRN Parris Manna, MD       vasopressin  (PITRESSIN) 20 Units in 100 mL (0.2 unit/mL) infusion-*FOR SHOCK*  0-0.03 Units/min Intravenous Continuous Aleskerov, Fuad, MD   Stopped at 07/03/24 2206   venlafaxine  (EFFEXOR ) tablet 37.5 mg  37.5 mg Oral BID WC Paudel, Keshab, MD   37.5 mg at 07/05/24 0757   Facility-Administered Medications Ordered in Other Encounters  Medication Dose Route Frequency Provider Last Rate Last Admin   heparin  injection    PRN Dew, Jason S, MD   10,000 Units at 04/08/23 1412     Abtx:  Anti-infectives (  From admission, onward)    Start     Dose/Rate Route Frequency Ordered Stop   07/03/24 2000  cefTRIAXone  (ROCEPHIN ) 2 g in sodium chloride  0.9 % 100 mL IVPB        2 g 200 mL/hr over 30 Minutes Intravenous Every 24 hours 07/03/24 1844     07/02/24 1745  doxycycline  (VIBRAMYCIN ) 100 mg in sodium chloride  0.9 % 250 mL IVPB  Status:  Discontinued        100 mg 125 mL/hr over 120 Minutes Intravenous Every 12 hours 07/02/24 1652 07/03/24 1844       REVIEW OF SYSTEMS:  NA Objective:  VITALS:  BP 116/70   Pulse (!) 53   Temp (!) 96.9 F (36.1 C) (Axillary)   Resp 16   Ht 5' 6 (1.676 m)   Wt 75.1 kg   SpO2 93%   BMI 26.72 kg/m   PHYSICAL EXAM:  General: intubated and sedated Head: Normocephalic, without obvious abnormality, atraumatic. Eyes: Conjunctivae clear, anicteric sclerae. Pupils are equal ENT cannot examine Neck: , symmetrical, rt internal jugular dialysis cath Back: No CVA tenderness. Lungs: b/l air entry Creps  Heart: systolic murmur Abdomen:  distended Extremities: multiple small ulcers-ankle/legs picture reviewed       Skin tear rt arm   Skin: No rashes or lesions. Or bruising Lymph: Cervical, supraclavicular normal. Neurologic: Grossly non-focal Pertinent Labs Lab Results CBC    Component Value Date/Time   WBC 13.7 (H) 07/05/2024 0439   RBC 3.63 (L) 07/05/2024 0439   HGB 11.2 (L) 07/05/2024 0439   HGB 11.7 (L) 07/07/2014 2207   HCT 32.6 (L) 07/05/2024 0439   HCT 38.2 07/07/2014 2207   PLT 68 (L) 07/05/2024 0439   PLT 199 07/07/2014 2207   MCV 89.8 07/05/2024 0439   MCV 92 07/07/2014 2207   MCH 30.9 07/05/2024 0439   MCHC 34.4 07/05/2024 0439   RDW 17.7 (H) 07/05/2024 0439   RDW 19.8 (H) 07/07/2014 2207   LYMPHSABS 0.5 (L) 07/01/2024 1336   LYMPHSABS 1.5 07/07/2014 2207   MONOABS 0.7 07/01/2024 1336   MONOABS 0.8 07/07/2014 2207   EOSABS 0.2 07/01/2024 1336   EOSABS 0.1 07/07/2014 2207   BASOSABS 0.0 07/01/2024 1336   BASOSABS 0.1 07/07/2014 2207       Latest Ref Rng & Units 07/05/2024    4:39 AM 07/04/2024    7:27 AM 07/04/2024    4:41 AM  CMP  Glucose 70 - 99 mg/dL 79   884   BUN 8 - 23 mg/dL 36   21   Creatinine 9.55 - 1.00 mg/dL 6.02   6.81   Sodium 864 - 145 mmol/L 131   134   Potassium 3.5 - 5.1 mmol/L 3.7  3.9  3.9   Chloride 98 - 111 mmol/L 91   92   CO2 22 - 32 mmol/L 25   25   Calcium  8.9 - 10.3 mg/dL 89.7   9.8       Microbiology: Recent Results (from the past 240 hours)  Culture, blood (Routine X 2) w Reflex to ID Panel     Status: None (Preliminary result)   Collection Time: 07/02/24  8:16 PM   Specimen: BLOOD  Result Value Ref Range Status   Specimen Description BLOOD BLOOD RIGHT HAND  Final   Special Requests   Final    BOTTLES DRAWN AEROBIC AND ANAEROBIC Blood Culture results may not be optimal due to an inadequate volume of blood received in culture  bottles   Culture  Setup Time   Final    GRAM POSITIVE COCCI ANAEROBIC BOTTLE ONLY CRITICAL VALUE NOTED.  VALUE IS  CONSISTENT WITH PREVIOUSLY REPORTED AND CALLED VALUE.    Culture   Final    NO GROWTH 3 DAYS Performed at [facility], [address] [place], [place] [address]    Report Status PENDING  Incomplete  Culture, blood (Routine X 2) w Reflex to ID Panel     Status: Abnormal (Preliminary result)   Collection Time: 07/02/24  9:25 PM   Specimen: BLOOD  Result Value Ref Range Status   Specimen Description   Final    BLOOD RIGHT ANTECUBITAL Performed at [facility], [address], [place], [place] [address]    Special Requests   Final    BOTTLES DRAWN AEROBIC AND ANAEROBIC Blood Culture adequate volume Performed at [facility], [address] [place], [place] [address]    Culture  Setup Time   Final    GRAM POSITIVE COCCI IN BOTH AEROBIC AND ANAEROBIC BOTTLES CRITICAL RESULT CALLED TO, READ BACK BY AND VERIFIED WITH: TIFFANY GILCHRIST 1817 07/03/24 MU    Culture (A)  Final    STREPTOCOCCUS GORDONII STAPHYLOCOCCUS EPIDERMIDIS CULTURE REINCUBATED FOR BETTER GROWTH THE SIGNIFICANCE OF ISOLATING THIS ORGANISM FROM A SINGLE SET OF BLOOD CULTURES WHEN MULTIPLE SETS ARE DRAWN IS UNCERTAIN. PLEASE NOTIFY THE MICROBIOLOGY DEPARTMENT WITHIN ONE WEEK IF SPECIATION AND SENSITIVITIES ARE REQUIRED. Performed at [facility], [address] [place], [place] [address]    Report Status PENDING  Incomplete  Blood Culture ID Panel (Reflexed)     Status: Abnormal   Collection Time: 07/02/24  9:25 PM  Result Value Ref Range Status   Enterococcus faecalis NOT DETECTED NOT DETECTED Final   Enterococcus Faecium NOT DETECTED NOT DETECTED Final   Listeria monocytogenes NOT DETECTED NOT DETECTED Final   Staphylococcus species DETECTED (A) NOT DETECTED Final    Comment: CRITICAL RESULT CALLED TO, READ BACK BY AND VERIFIED WITH: TIFFANY GILCHRIST 1817 07/03/24 MU    Staphylococcus aureus (BCID) NOT DETECTED NOT DETECTED Final   Staphylococcus epidermidis DETECTED (A) NOT  DETECTED Final    Comment: Methicillin (oxacillin) resistant coagulase negative staphylococcus. Possible blood culture contaminant (unless isolated from more than one blood culture draw or clinical case suggests pathogenicity). No antibiotic treatment is indicated for blood  culture contaminants. CRITICAL RESULT CALLED TO, READ BACK BY AND VERIFIED WITH: TIFFANY GILCHRIST 1817 07/03/24 MU    Staphylococcus lugdunensis NOT DETECTED NOT DETECTED Final   Streptococcus species DETECTED (A) NOT DETECTED Final    Comment: Not Enterococcus species, Streptococcus agalactiae, Streptococcus pyogenes, or Streptococcus pneumoniae. CRITICAL RESULT CALLED TO, READ BACK BY AND VERIFIED WITH: TIFFANY GILCHRIST 1817 07/03/24 MU    Streptococcus agalactiae NOT DETECTED NOT DETECTED Final   Streptococcus pneumoniae NOT DETECTED NOT DETECTED Final   Streptococcus pyogenes NOT DETECTED NOT DETECTED Final   A.calcoaceticus-baumannii NOT DETECTED NOT DETECTED Final   Bacteroides fragilis NOT DETECTED NOT DETECTED Final   Enterobacterales NOT DETECTED NOT DETECTED Final   Enterobacter cloacae complex NOT DETECTED NOT DETECTED Final   Escherichia coli NOT DETECTED NOT DETECTED Final   Klebsiella aerogenes NOT DETECTED NOT DETECTED Final   Klebsiella oxytoca NOT DETECTED NOT DETECTED Final   Klebsiella pneumoniae NOT DETECTED NOT DETECTED Final   Proteus species NOT DETECTED NOT DETECTED Final   Salmonella species NOT DETECTED NOT DETECTED Final   Serratia marcescens NOT DETECTED NOT DETECTED  Final   Haemophilus influenzae NOT DETECTED NOT DETECTED Final   Neisseria meningitidis NOT DETECTED NOT DETECTED Final   Pseudomonas aeruginosa NOT DETECTED NOT DETECTED Final   Stenotrophomonas maltophilia NOT DETECTED NOT DETECTED Final   Candida albicans NOT DETECTED NOT DETECTED Final   Candida auris NOT DETECTED NOT DETECTED Final   Candida glabrata NOT DETECTED NOT DETECTED Final   Candida krusei NOT DETECTED NOT  DETECTED Final   Candida parapsilosis NOT DETECTED NOT DETECTED Final   Candida tropicalis NOT DETECTED NOT DETECTED Final   Cryptococcus neoformans/gattii NOT DETECTED NOT DETECTED Final   Methicillin resistance mecA/C DETECTED (A) NOT DETECTED Final    Comment: CRITICAL RESULT CALLED TO, READ BACK BY AND VERIFIED WITH: TIFFANY GILCHRIST 1817 07/03/24 MU Performed at [facility], [address] [place], [place] [address]   CULTURE, BLOOD (ROUTINE X 2) w Reflex to ID Panel     Status: None (Preliminary result)   Collection Time: 07/04/24  4:44 PM   Specimen: BLOOD RIGHT WRIST  Result Value Ref Range Status   Specimen Description BLOOD RIGHT WRIST  Final   Special Requests   Final    BOTTLES DRAWN AEROBIC AND ANAEROBIC Blood Culture adequate volume   Culture   Final    NO GROWTH < 24 HOURS Performed at [facility], [address], [place], [place] [address]    Report Status PENDING  Incomplete  CULTURE, BLOOD (ROUTINE X 2) w Reflex to ID Panel     Status: None (Preliminary result)   Collection Time: 07/04/24  4:44 PM   Specimen: BLOOD RIGHT HAND  Result Value Ref Range Status   Specimen Description BLOOD RIGHT HAND  Final   Special Requests   Final    BOTTLES DRAWN AEROBIC AND ANAEROBIC Blood Culture adequate volume   Culture   Final    NO GROWTH < 24 HOURS Performed at [facility], [address] [place], [place] [address]    Report Status PENDING  Incomplete     IMAGING RESULTS: CXR cardiomegaly, pullmonary edema  I have personally reviewed the films CT cervical spine Collapse of the superior endplate of C6 and inferior endplate of C5   ?     Patient has: []  acute illness w/systemic sxs  [mod] [x]  illness posing risk to life or function  [high]  I reviewed:  (3+) [x]  primary team note [x]  consultant note(s) [x]  procedure/op note(s) []  micro result(s)   []  CBC results []  chemistry results [x]  radiology report(s) []  nursing note(s)   I independently visualized:  (any)   []  cxs/plates in lab [x]  plain film images [x]  CT images []  PET images   []  path slide(s) [x]  ECG tracing []  MRI images []  nuclear scan  I discussed: (any) []  micro and/or path w/lab personnel [x]  drug options and/or interactions w/ID pharmD   []  procedure/OR findings w/other MD(s) []  echo and/or imaging w/other MD(s)   [x]  mgm't w/attending(s) involved in case []  setting up home abx w/OPAT team  Mgm't requires: []  prescription drug(s)  [mod] [x]  intensive toxicity monitoring  [high]     Impression/Recommendation  ?64 y.o. female with a history of ESRD, CAD s/p CABG, Afib ICM, h/o pericardial effusion h/o PEA arrest , steal syndrome rt arm with ligation of rt brachial artery to axillary vein graft on 04/20/24 recently in the hospital between 06/22/24 until 06/28/24 for syncope, fluid over load, chronic hypotension, cardiogenic shock EF 25-30%,  presenting with fall/ weakness and found on  the floor  Syncope Related to her cardiac state   Severe ischemic cardiomyopathy with biventricular failure   ESRD on dialysis thru a catheter  Ventricular fibrillation and cardiac arrest on 07/02/24- resucitated - now intubated  on pressor and amiodarone   Strep gordonii bacteremia-  blood culture done after resuscitation though she had fever on the day of admission  but no blood  culture sent on admission  So is this bacteria secondary to translocation from the gut during the code ? Or was this  present before that ? If the latter there will be concern for endocarditis as this organism is closely associated with it. With her having HD cath the risk for infection and endocarditis increases Awit 2nd set to finalize and if that is also Strep gordonii may have to get TEE if she is stable We can repeat a 2 d echo( she did have one on 1/9) Continue ceftriaxone  Repeat blood cultrue has been sent  Staph epidermidis in blood culture is a contaminant and no rx needed  C5-C6  vertebral collaspe of endplates- degeneration VS burnt out infection   Ascites s/p paracentesis- possible cirrhosis could be cardiac cirrhosis  Chronic thrombocytopenia  likely due to the above Anemia    HEPC antibody positive but RNA negative -= so no active infection  Stasis dermatitis with superficial ulcers- question of calciphylaxis vs PAD  ? This consult involved complex infectious pathology and management  Note:  This document was prepared using Dragon voice recognition software and may include unintentional dictation errors.     [1]  Allergies Allergen Reactions   Morphine  And Codeine Hives   Levaquin [Levofloxacin] Itching and Other (See Comments)    Severe itching; prickly sensation; severe left leg pain; immobility.   Enalapril  Other (See Comments)    hallucinations   Latex Rash   Tape Rash and Other (See Comments)

## 2024-07-05 NOTE — Progress Notes (Signed)
 PHARMACY - ANTICOAGULATION CONSULT NOTE  Pharmacy Consult for UFH infusion Indication: atrial fibrillation  Allergies[1]  Patient Measurements: Height: 5' 6 (167.6 cm) Weight: 74.7 kg (164 lb 10.9 oz) IBW/kg (Calculated) : 59.3 HEPARIN  DW (KG): 70.5  Vital Signs: Temp: 98.1 F (36.7 C) (01/20 2330) Temp Source: Axillary (01/20 2330) BP: 101/54 (01/21 0115) Pulse Rate: 48 (01/21 0115)  Labs: Recent Labs    07/02/24 1646 07/03/24 0543 07/03/24 1525 07/04/24 0441 07/04/24 1327 07/05/24 0020  HGB 12.2 12.4  --  12.1  --   --   HCT 37.3 37.9  --  35.8*  --   --   PLT 70* 63*  --  73*  --   --   APTT  --  102*   < > 53* 63* 78*  HEPARINUNFRC  --  >1.10*  --  >1.10*  --   --   CREATININE 3.57* 4.09*  --  3.18*  --   --    < > = values in this interval not displayed.    Estimated Creatinine Clearance: 19.2 mL/min (A) (by C-G formula based on SCr of 3.18 mg/dL (H)).   Medical History: Past Medical History:  Diagnosis Date   Acute lacunar infarction (HCC) 03/03/2014   a.) MRI brain 03/03/2014: two acute punctate lacunar infarcts anteriorly in the BILATERAL centrum semiovale   Adult behavior problems    Anemia of chronic renal failure    Aortic atherosclerosis    Arthritis    Atrial fibrillation and flutter (HCC)    a.) CHA2DS2VASc = 6 (sex, HFrEF, HTN, CVA x 2, prior MI/vascular disease) as of 02/04/2024; b.) rate/rhythm maintained on oral metoprolol  succinate; chronically anticoagulated with apixaban    Bipolar disorder (HCC)    Cardiomegaly    Cerebral microvascular disease    CHF (congestive heart failure) (HCC)    Chicken pox    Cholelithiasis    Chronic respiratory failure with hypoxia (HCC) 12/19/2021   COPD (chronic obstructive pulmonary disease) (HCC)    Coronary artery disease    DDD (degenerative disc disease), cervical    DDD (degenerative disc disease), thoracolumbar    Diverticulosis    Dyspnea    ESRD (end stage renal disease) on dialysis (M,W,F)     GERD (gastroesophageal reflux disease)    Gout    HCV (hepatitis C virus)    Headache    HFrEF (heart failure with reduced ejection fraction) (HCC)    History of delirium 04/12/2014   History of substance abuse (HCC)    a.) tobacco + cocaine   Hyperkalemia 11/13/2014   Hyperlipidemia    Hypertension    Hypotension 02/24/2014   Hypothyroidism    Ischemic cardiomyopathy    Nose colonized with MRSA 02/01/2024   a.) presurgical PCR (+) on 02/01/2024 prior to A/V FISTULAGRAM; b.) presurgical PCR (+) on 03/07/2024 prior to AV FISTULA CREATION   NSTEMI (non-ST elevated myocardial infarction) (HCC) 10/08/2013   NSTEMI (non-ST elevated myocardial infarction) (HCC) 01/23/2002   a.) LHC 01/24/2002 --> multi-vessel CAD with IRA being 90% OM2 --> delayed PCI until 01/25/2002 --> 3.0 x 23 mm BX Velocity stent   Obesity    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    On apixaban  therapy    Peripheral vascular disease    Pneumonia    Postoperative anemia due to acute blood loss 02/27/2014   Postoperative cerebrovascular infarction following cardiac surgery 02/21/2014   a.) developed RIGHT upper/lower extremity weakness following cardiac revascularization --> neuro consult and  head CT --> Hypodensity noted in the area of the LEFT motor cortex consistent with a component of acute ischemia   Pulmonary hypertension (HCC)    RBBB (right bundle branch block)    Renal osteodystrophy    S/P CABG x 3 02/20/2014   a.) LIMA-LAD, SVG-OM1, SVG-PDA   Stroke (HCC) 05/23/2010   a.) brain MRI 05/23/2010: BILATERAL acute infarcts and old prior infarcts; RIGHT frontal lobe extending into the white matter. Small focus of restricted diffusion in the cortex of the medial LEFT frontoparietal lobe. Periventricular T2 and multiple foci of T2 hyperintensity in the subcortical white matter and increased FLAIR signal in the cortex of the LEFT frontal lobe.   Syncope and collapse 07/25/2020    Medications:   Scheduled:   Chlorhexidine  Gluconate Cloth  6 each Topical Q0600   collagenase   1 Application Topical Daily   feeding supplement (PROSource TF20)  60 mL Per Tube Daily   free water   30 mL Per Tube Q4H   levothyroxine   75 mcg Oral Q0600   midodrine   10 mg Oral TID WC   multivitamin  1 tablet Per Tube QHS   nutrition supplement (JUVEN)  1 packet Per Tube BID BM   mouth rinse  15 mL Mouth Rinse Q2H   pantoprazole  (PROTONIX ) IV  40 mg Intravenous Daily   sodium chloride  flush  10-40 mL Intracatheter Q12H   venlafaxine   37.5 mg Oral BID WC    Assessment: 64 y/o F with a h/o atrial fibrillation on apixaban . Last dose was today at 0900. Patient coded on 07-29-24 with subsequent ROSC and is now in the ICU on ventilation and pressor support. Pharmacy consulted for IV heparin .   Goal of Therapy:  Heparin  level 0.3-0.7 units/ml aPTT 66-102 seconds Monitor platelets by anticoagulation protocol: Yes   Plan: aPTT therapeutic x 1 --Continue heparin  infusion at 1300 units/hr --Recheck aPTT in 8 hrs to confirm --Transition to anti-Xa levels when correlating --Daily anti-Xa/CBC  [name], PharmD, [facility] 07/05/2024 2:12 AM              [1]  Allergies Allergen Reactions   Morphine  And Codeine Hives   Levaquin [Levofloxacin] Itching and Other (See Comments)    Severe itching; prickly sensation; severe left leg pain; immobility.   Enalapril  Other (See Comments)    hallucinations   Latex Rash   Tape Rash and Other (See Comments)

## 2024-07-05 NOTE — Progress Notes (Signed)
 PHARMACY - ANTICOAGULATION CONSULT NOTE  Pharmacy Consult for UFH infusion Indication: atrial fibrillation  Allergies[1]  Patient Measurements: Height: 5' 6 (167.6 cm) Weight: 75.1 kg (165 lb 9.1 oz) IBW/kg (Calculated) : 59.3 HEPARIN  DW (KG): 70.5  Vital Signs: Temp: 96.8 F (36 C) (01/21 0700) Temp Source: Axillary (01/21 0700) BP: 112/54 (01/21 0930) Pulse Rate: 54 (01/21 0930)  Labs: Recent Labs    07/03/24 0543 07/03/24 1525 07/04/24 0441 07/04/24 1327 07/05/24 0020 07/05/24 0439 07/05/24 0800  HGB 12.4  --  12.1  --   --  11.2*  --   HCT 37.9  --  35.8*  --   --  32.6*  --   PLT 63*  --  73*  --   --  68*  --   APTT 102*   < > 53* 63* 78*  --  86*  HEPARINUNFRC >1.10*  --  >1.10*  --   --   --  0.82*  CREATININE 4.09*  --  3.18*  --   --  3.97*  --    < > = values in this interval not displayed.    Estimated Creatinine Clearance: 15.4 mL/min (A) (by C-G formula based on SCr of 3.97 mg/dL (H)).   Medical History: Past Medical History:  Diagnosis Date   Acute lacunar infarction (HCC) 03/03/2014   a.) MRI brain 03/03/2014: two acute punctate lacunar infarcts anteriorly in the BILATERAL centrum semiovale   Adult behavior problems    Anemia of chronic renal failure    Aortic atherosclerosis    Arthritis    Atrial fibrillation and flutter (HCC)    a.) CHA2DS2VASc = 6 (sex, HFrEF, HTN, CVA x 2, prior MI/vascular disease) as of 02/04/2024; b.) rate/rhythm maintained on oral metoprolol  succinate; chronically anticoagulated with apixaban    Bipolar disorder (HCC)    Cardiomegaly    Cerebral microvascular disease    CHF (congestive heart failure) (HCC)    Chicken pox    Cholelithiasis    Chronic respiratory failure with hypoxia (HCC) 12/19/2021   COPD (chronic obstructive pulmonary disease) (HCC)    Coronary artery disease    DDD (degenerative disc disease), cervical    DDD (degenerative disc disease), thoracolumbar    Diverticulosis    Dyspnea    ESRD  (end stage renal disease) on dialysis (M,W,F)    GERD (gastroesophageal reflux disease)    Gout    HCV (hepatitis C virus)    Headache    HFrEF (heart failure with reduced ejection fraction) (HCC)    History of delirium 04/12/2014   History of substance abuse (HCC)    a.) tobacco + cocaine   Hyperkalemia 11/13/2014   Hyperlipidemia    Hypertension    Hypotension 02/24/2014   Hypothyroidism    Ischemic cardiomyopathy    Nose colonized with MRSA 02/01/2024   a.) presurgical PCR (+) on 02/01/2024 prior to A/V FISTULAGRAM; b.) presurgical PCR (+) on 03/07/2024 prior to AV FISTULA CREATION   NSTEMI (non-ST elevated myocardial infarction) (HCC) 10/08/2013   NSTEMI (non-ST elevated myocardial infarction) (HCC) 01/23/2002   a.) LHC 01/24/2002 --> multi-vessel CAD with IRA being 90% OM2 --> delayed PCI until 01/25/2002 --> 3.0 x 23 mm BX Velocity stent   Obesity    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    On apixaban  therapy    Peripheral vascular disease    Pneumonia    Postoperative anemia due to acute blood loss 02/27/2014   Postoperative cerebrovascular infarction following cardiac surgery  02/21/2014   a.) developed RIGHT upper/lower extremity weakness following cardiac revascularization --> neuro consult and head CT --> Hypodensity noted in the area of the LEFT motor cortex consistent with a component of acute ischemia   Pulmonary hypertension (HCC)    RBBB (right bundle branch block)    Renal osteodystrophy    S/P CABG x 3 02/20/2014   a.) LIMA-LAD, SVG-OM1, SVG-PDA   Stroke (HCC) 05/23/2010   a.) brain MRI 05/23/2010: BILATERAL acute infarcts and old prior infarcts; RIGHT frontal lobe extending into the white matter. Small focus of restricted diffusion in the cortex of the medial LEFT frontoparietal lobe. Periventricular T2 and multiple foci of T2 hyperintensity in the subcortical white matter and increased FLAIR signal in the cortex of the LEFT frontal lobe.   Syncope and  collapse 07/25/2020    Medications:  Scheduled:   Chlorhexidine  Gluconate Cloth  6 each Topical Daily   collagenase   1 Application Topical Daily   feeding supplement (PROSource TF20)  60 mL Per Tube Daily   free water   30 mL Per Tube Q4H   levothyroxine   75 mcg Oral Q0600   midodrine   10 mg Oral TID WC   multivitamin  1 tablet Per Tube QHS   nutrition supplement (JUVEN)  1 packet Per Tube BID BM   mouth rinse  15 mL Mouth Rinse Q2H   pantoprazole  (PROTONIX ) IV  40 mg Intravenous Daily   sodium chloride  flush  10-40 mL Intracatheter Q12H   venlafaxine   37.5 mg Oral BID WC    Assessment: 64 y/o F with a h/o atrial fibrillation on apixaban . Last dose was 1/18 at 0900. Patient coded on Jul 12, 2024 with subsequent ROSC and is now in the ICU on ventilation and pressor support. Pharmacy consulted for IV heparin .   Goal of Therapy:  Heparin  level 0.3-0.7 units/ml aPTT 66-102 seconds Monitor platelets by anticoagulation protocol: Yes   Plan: aPTT therapeutic x 2 --Continue heparin  infusion at 1300 units/hr --recheck APTT and heparin  level tomorrow AM --Transition to anti-Xa levels when correlating > heparin  level close to range --Daily anti-Xa/CBC  Belvie Macintosh, PharmD Candidate 07/05/2024 9:33 AM               [1]  Allergies Allergen Reactions   Morphine  And Codeine Hives   Levaquin [Levofloxacin] Itching and Other (See Comments)    Severe itching; prickly sensation; severe left leg pain; immobility.   Enalapril  Other (See Comments)    hallucinations   Latex Rash   Tape Rash and Other (See Comments)

## 2024-07-05 NOTE — Consult Note (Signed)
 PHARMACY CONSULT NOTE - ELECTROLYTES  Pharmacy Consult for Electrolyte Monitoring and Replacement   Recent Labs: Height: 5' 6 (167.6 cm) Weight: 75.1 kg (165 lb 9.1 oz) IBW/kg (Calculated) : 59.3 Estimated Creatinine Clearance: 15.4 mL/min (A) (by C-G formula based on SCr of 3.97 mg/dL (H)). Potassium (mmol/L)  Date Value  07/05/2024 3.7  07/11/2014 4.7   Magnesium  (mg/dL)  Date Value  98/78/7973 2.4  04/10/2014 1.9   Calcium  (mg/dL)  Date Value  98/78/7973 10.2   Calcium , Total (mg/dL)  Date Value  98/74/7983 8.3 (L)   Albumin  (g/dL)  Date Value  98/78/7973 2.9 (L)  07/07/2014 3.1 (L)   Phosphorus (mg/dL)  Date Value  98/78/7973 2.8  07/09/2014 3.9   Sodium (mmol/L)  Date Value  07/05/2024 131 (L)  07/09/2014 139    Assessment  [name] is a 64 y.o. female presenting with syncope/in-hospital cardiac arrest. PMH significant for ESRD HD MWF, AF on eliquis , HFrEF, COPD, prior CVA. Pharmacy has been consulted to monitor and replace electrolytes.  Diet: Vital AF at 55 mL/hr + free water  flushes at 30 mL every 4 hours  Goal of Therapy: Electrolytes WNL  Plan:  20 mEq KCl per tube x 1 Check BMP, Mg, Phos with AM labs after HD session today  Thank you for allowing pharmacy to be a part of this patient's care.  Adriana Bolster, PharmD, BCPS 07/05/2024 7:27 AM

## 2024-07-05 NOTE — Progress Notes (Signed)
 " [facility]  PROGRESS NOTE   Subjective:   Patient seen at bedside.   Patient remains critically ill, intubated and sedated.  Irregular rhythm noted Off pressors at the moment. Sedation-propofol  Patient was bradycardic over night therefore amiodarone  drip was stopped. Heparin  drip Tube feeds Ventilator assisted, FiO2 28%, PEEP 5  Hemodialysis this morning.  Initially blood pressure dropped therefore ultrafiltration was turned off.  After that she has been tolerating the treatment well.   HEMODIALYSIS FLOWSHEET:  Blood Flow Rate (mL/min): 399 mL/min Arterial Pressure (mmHg): -172.31 mmHg Venous Pressure (mmHg): 174.74 mmHg TMP (mmHg): -7.68 mmHg Ultrafiltration Rate (mL/min): 130 mL/min Dialysate Flow Rate (mL/min): 300 ml/min Dialysis Fluid Bolus: Normal Saline Bolus Amount (mL): 100 mL    Objective:  Vital signs: Blood pressure (!) 92/54, pulse (!) 52, temperature (!) 96.8 F (36 C), temperature source Axillary, resp. rate 16, height 5' 6 (1.676 m), weight 75.1 kg, SpO2 92%.  Intake/Output Summary (Last 24 hours) at 07/05/2024 1103 Last data filed at 07/05/2024 0700 Gross per 24 hour  Intake 2025.38 ml  Output 900 ml  Net 1125.38 ml   Filed Weights   07/04/24 0400 07/05/24 0500 07/05/24 0700  Weight: 74.7 kg 75.1 kg 75.1 kg     Physical Exam: General:  No acute distress  Head:  Normocephalic, atraumatic.  ET tube, OG tube in place  Eyes:  Anicteric  Lungs:   Clear to auscultation, ventilator assisted  Heart:  S1S2 no rubs  Abdomen:   Soft, distended, hypoactive bowel sounds  Extremities:  + peripheral edema.  Neurologic:  sedated  Skin:  Left foot wound  Access: Right IJ dialysis catheter    Basic Metabolic Panel: Recent Labs  Lab 07/01/24 2155 07/02/24 0526 07/02/24 1646 07/03/24 0543 07/03/24 1847 07/04/24 0441 07/04/24 0727 07/05/24 0439  NA 135 135 136 134*  --  134*  --  131*  K 4.3 4.3 3.9 4.5 3.8 3.9 3.9 3.7  CL 93* 92* 90*  90*  --  92*  --  91*  CO2 19* 23 23 23   --  25  --  25  GLUCOSE 90 106* 154* 169*  --  115*  --  79  BUN 32* 35* 18 26*  --  21  --  36*  CREATININE 5.43* 5.79* 3.57* 4.09*  --  3.18*  --  3.97*  CALCIUM  10.2 10.5* 10.3 10.2  --  9.8  --  10.2  MG 2.1  --   --   --  1.8 1.9 1.9 2.4  PHOS  --   --   --   --   --  2.7  --  2.8   GFR: Estimated Creatinine Clearance: 15.4 mL/min (A) (by C-G formula based on SCr of 3.97 mg/dL (H)).  Liver Function Tests: Recent Labs  Lab 07/01/24 1336 07/01/24 2155 07/02/24 0526 07/02/24 1646 07/04/24 0441 07/05/24 0439  AST 36 36 37 54*  --   --   ALT 15 15 14 19   --   --   ALKPHOS 71 68 72 81  --   --   BILITOT 1.2 1.2 1.3* 1.4*  --   --   PROT 7.2 6.8 6.9 6.6  --   --   ALBUMIN  3.8 3.5 3.5 3.5 3.1* 2.9*   No results for input(s): LIPASE, AMYLASE in the last 168 hours. Recent Labs  Lab 07/01/24 1726  AMMONIA 15    CBC: Recent Labs  Lab 07/01/24 1336 07/02/24 0526 07/02/24  1646 07/03/24 0543 07/04/24 0441 07/05/24 0439  WBC 12.0* 12.2* 14.2* 14.7* 13.9* 13.7*  NEUTROABS 10.4*  --   --   --   --   --   HGB 12.7 12.3 12.2 12.4 12.1 11.2*  HCT 38.7 37.0 37.3 37.9 35.8* 32.6*  MCV 97.0 95.9 96.4 96.2 93.0 89.8  PLT 82* 68* 70* 63* 73* 68*     HbA1C: No results found for: HGBA1C  Urinalysis: No results for input(s): COLORURINE, LABSPEC, PHURINE, GLUCOSEU, HGBUR, BILIRUBINUR, KETONESUR, PROTEINUR, UROBILINOGEN, NITRITE, LEUKOCYTESUR in the last 72 hours.  Invalid input(s): APPERANCEUR    Imaging: DG Abd 1 View Result Date: 07/04/2024 CLINICAL DATA:  Abdominal distension. EXAM: ABDOMEN - 1 VIEW COMPARISON:  Abdominal radiograph dated 07/02/2024. FINDINGS: No bowel dilatation or evidence of obstruction. No free air or radiopaque calculi. Atherosclerotic calcification of the abdominal aorta and iliac arteries. A cannula is noted to the right of the spine. Calcified uterine fibroid. No acute osseous  pathology. IMPRESSION: Nonobstructive bowel gas pattern. Electronically Signed   By: Vanetta Chou M.D.   On: 07/04/2024 13:15     Medications:    amiodarone  30 mg/hr (07/05/24 0836)   cefTRIAXone  (ROCEPHIN )  IV Stopped (07/04/24 2131)   feeding supplement (VITAL AF 1.2 CAL) Stopped (07/04/24 1900)   heparin  1,300 Units/hr (07/05/24 0700)   norepinephrine  (LEVOPHED ) Adult infusion Stopped (07/04/24 2343)   propofol  (DIPRIVAN ) infusion 30 mcg/kg/min (07/05/24 0700)   vasopressin  Stopped (07/03/24 2206)    Chlorhexidine  Gluconate Cloth  6 each Topical Daily   collagenase   1 Application Topical Daily   feeding supplement (PROSource TF20)  60 mL Per Tube Daily   free water   30 mL Per Tube Q4H   levothyroxine   75 mcg Oral Q0600   midodrine   10 mg Oral TID WC   multivitamin  1 tablet Per Tube QHS   nutrition supplement (JUVEN)  1 packet Per Tube BID BM   mouth rinse  15 mL Mouth Rinse Q2H   pantoprazole  (PROTONIX ) IV  40 mg Intravenous Daily   potassium chloride   20 mEq Oral Once   sodium chloride  flush  10-40 mL Intracatheter Q12H   venlafaxine   37.5 mg Oral BID WC    Assessment/ Plan:     64 year old female with history of coronary disease history of CABG, ESRD, COPD, atrial fibrillation on hemodialysis Monday Wednesday and Friday now admitted with history of syncope/fall.  In hospital cardiac arrest on Sunday, 07/02/2024 Outpatient dialysis: [facility]/MWF/right PermCath/72.5 kg    ESRD: HD Monday Wednesday and Friday.  Off pressors now.  Tolerating intermittent hemodialysis.  Continue MWF schedule.  Minimal UF.  ANEMIA of chronic kidney disease: Hemoglobin acceptable at 11.2.  Will continue to monitor closely.   MBD: Will check PTH, calcium  and phosphorus levels.  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  Will continue sodium thiosulfate  when she stabilizes.   Hypotension/VOL: Off pressors now.  Continues to have A-fib with RVR.  Cardiology team  following along for chronic systolic CHF.  Acute respiratory failure-requiring ventilator postcardiac arrest on 07/02/2024.  FiO2 28%.    Labs and medications reviewed. Will continue to follow along with you.   LOS: 3 Vanessa Stank, MD [facility] 1/21/202611:03 AM  "

## 2024-07-05 NOTE — Progress Notes (Signed)
 [facility] CLINIC CARDIOLOGY PROGRESS NOTE   Patient ID: [name] MRN: [id number] DOB/AGE: 1961-03-30 64 y.o.  Admit date: 07/01/2024 Referring Physician Dr. Devaughn Ban  Primary Physician Orlean Alan HERO, FNP  Primary Cardiologist Dr. Denyse Bathe Reason for Consultation syncope  HPI: [name] is a 64 y.o. female with a past medical history of chronic HFrEF, coronary artery disease, atrial fibrillation on eliquis , COPD, ESRD on HD, bipolar disorder, hypotension on midodrine , hx CVA  who presented to the ED on 07/01/2024 for syncopal event at home. Cardiology was consulted for further evaluation.   Interval History:  -Patient seen and examined this AM, remains intubated and sedated.  - Did have episode of bradycardia overnight and thus amiodarone  was stopped.  Overall ectopy seems to have improved on telemetry. - Levophed  stopped overnight as well.  Review of systems complete and found to be negative unless listed above   Vitals:   07/05/24 0800 07/05/24 0815 07/05/24 0830 07/05/24 0845  BP: (!) 98/48 (!) 109/55 120/60 (!) 126/58  Pulse: (!) 50 (!) 53 (!) 57 (!) 58  Resp: 16 16 16 16   Temp:      TempSrc:      SpO2: 94% 93% 94% 95%  Weight:      Height:         Intake/Output Summary (Last 24 hours) at 07/05/2024 0902 Last data filed at 07/05/2024 0700 Gross per 24 hour  Intake 2085.38 ml  Output 900 ml  Net 1185.38 ml     PHYSICAL EXAM General: Ill appearing female, intubated/sedated. HEENT: Normocephalic and atraumatic. Neck: No JVD.  Lungs: Mechanical breath sounds Heart: HRRR. Normal S1 and S2 without gallops or murmurs. Radial & DP pulses 2+ bilaterally. Abdomen: Non-distended appearing.  Msk: Normal strength and tone for age. Extremities: No clubbing, cyanosis or edema.     LABS: Basic Metabolic Panel: Recent Labs    07/04/24 0441 07/04/24 0727 07/05/24 0439  NA 134*  --  131*  K 3.9 3.9 3.7  CL 92*  --  91*  CO2 25  --  25  GLUCOSE 115*   --  79  BUN 21  --  36*  CREATININE 3.18*  --  3.97*  CALCIUM  9.8  --  10.2  MG 1.9 1.9 2.4  PHOS 2.7  --  2.8   Liver Function Tests: Recent Labs    07/02/24 1646 07/04/24 0441 07/05/24 0439  AST 54*  --   --   ALT 19  --   --   ALKPHOS 81  --   --   BILITOT 1.4*  --   --   PROT 6.6  --   --   ALBUMIN  3.5 3.1* 2.9*   No results for input(s): LIPASE, AMYLASE in the last 72 hours. CBC: Recent Labs    07/04/24 0441 07/05/24 0439  WBC 13.9* 13.7*  HGB 12.1 11.2*  HCT 35.8* 32.6*  MCV 93.0 89.8  PLT 73* 68*   Cardiac Enzymes: No results for input(s): CKTOTAL, CKMB, CKMBINDEX, TROPONINIHS in the last 72 hours. BNP: No results for input(s): BNP in the last 72 hours. D-Dimer: No results for input(s): DDIMER in the last 72 hours. Hemoglobin A1C: No results for input(s): HGBA1C in the last 72 hours. Fasting Lipid Panel: Recent Labs    07/03/24 0543  TRIG 293*   Thyroid  Function Tests: No results for input(s): TSH, T4TOTAL, T3FREE, THYROIDAB in the last 72 hours.  Invalid input(s): FREET3 Anemia Panel: No results for input(s):  VITAMINB12, FOLATE, FERRITIN, TIBC, IRON, RETICCTPCT in the last 72 hours.  DG Abd 1 View Result Date: 07/04/2024 CLINICAL DATA:  Abdominal distension. EXAM: ABDOMEN - 1 VIEW COMPARISON:  Abdominal radiograph dated 07/02/2024. FINDINGS: No bowel dilatation or evidence of obstruction. No free air or radiopaque calculi. Atherosclerotic calcification of the abdominal aorta and iliac arteries. A cannula is noted to the right of the spine. Calcified uterine fibroid. No acute osseous pathology. IMPRESSION: Nonobstructive bowel gas pattern. Electronically Signed   By: Vanetta Chou M.D.   On: 07/04/2024 13:15     ECHO 06/23/2024: 1. Left ventricular ejection fraction, by estimation, is 25 to 30%. The left ventricle has severely decreased function. The left ventricle demonstrates global hypokinesis. The left  ventricular internal cavity size was severely dilated. Left ventricular diastolic function could not be evaluated.   2. Right ventricular systolic function is severely reduced. The right  ventricular size is severely enlarged. Mildly increased right ventricular  wall thickness.   3. Left atrial size was mildly dilated.   4. Right atrial size was severely dilated.   5. The mitral valve is normal in structure. Mild mitral valve regurgitation.   6. Tricuspid valve regurgitation is severe.   7. The aortic valve is grossly normal. Aortic valve regurgitation is not visualized. Aortic valve sclerosis is present, with no evidence of aortic  valve stenosis.   TELEMETRY (personally reviewed): Sinus bradycardia rate 50s  EKG (personally reviewed): AF RBBB rate 167 bpm  DATA reviewed by me 07/05/24: last 24h vitals tele labs imaging I/O, hospitalist progress note  Principal Problem:   Syncope and collapse Active Problems:   ESRD on hemodialysis (HCC)   Chronic hypotension   S/P CABG x 3   Mild dementia associated with other underlying disease, without behavioral disturbance, psychotic disturbance, mood disturbance, or anxiety (HCC)   Anemia of chronic disease   Paroxysmal atrial fibrillation (HCC)   Hypothyroidism   Chronic combined systolic and diastolic CHF (congestive heart failure) (HCC)   Hepatitis C antibody positive in blood   Other cirrhosis of liver (HCC)   Hypertension   Bipolar disorder in full remission, most recent episode unspecified type   Chronic pain syndrome   Other secondary kyphosis, cervical region   Cervical spondylosis    ASSESSMENT AND PLAN: [name] is a 64 y.o. female with a past medical history of chronic HFrEF, coronary artery disease, atrial fibrillation on eliquis , COPD, ESRD on HD, bipolar disorder, hypotension on midodrine , hx CVA  who presented to the ED on 07/01/2024 for syncopal event at home. Cardiology was consulted for further evaluation.   #  Atrial fibrillation RVR # Paroxysmal atrial fibrillation # Syncope/ ?cardiac arrest # Chronic HFrEF # Coronary artery disease Patient brought to ED after being found down at home by family. Initially was admitted to the floor but on 07/02/24 she became unresponsive and was tachycardia, concern for wide complex tachycardia but has RBBB at baseline. Underwent ACLS, received 1 shock and 2 doses of epi with ROSC. Started on IV amiodarone .  Lidocaine  started 07/04/2024 by EP.  Overnight on 07/05/2024 developed bradycardia and thus IV amiodarone  was discontinued. -EP following, appreciate their assistance.  -Continue IV heparin .  -Further management per primary team. No plan for invasive cardiac procedures at this time. -Agree with palliative care evaluation for continued goals of care conversations.  This patient's case was discussed and created with Dr. Florencio and he is in agreement.  SignedBETHA Danita Bloch, PA-C  07/05/2024, 9:02 AM  [facility] Cardiology

## 2024-07-05 NOTE — Plan of Care (Signed)
" °  Problem: Health Behavior/Discharge Planning: Goal: Ability to manage health-related needs will improve Outcome: Not Progressing   Problem: Clinical Measurements: Goal: Ability to maintain clinical measurements within normal limits will improve Outcome: Progressing Goal: Will remain free from infection Outcome: Progressing Goal: Diagnostic test results will improve Outcome: Progressing Goal: Respiratory complications will improve Outcome: Progressing Goal: Cardiovascular complication will be avoided Outcome: Not Progressing   Problem: Coping: Goal: Level of anxiety will decrease Outcome: Progressing   Problem: Elimination: Goal: Will not experience complications related to bowel motility Outcome: Progressing   "

## 2024-07-05 NOTE — Plan of Care (Signed)
" °  Problem: Clinical Measurements: Goal: Ability to maintain clinical measurements within normal limits will improve Outcome: Progressing Goal: Diagnostic test results will improve Outcome: Progressing Goal: Respiratory complications will improve Outcome: Progressing Goal: Cardiovascular complication will be avoided Outcome: Progressing   Problem: Activity: Goal: Risk for activity intolerance will decrease Outcome: Progressing   Problem: Nutrition: Goal: Adequate nutrition will be maintained Outcome: Progressing   Problem: Elimination: Goal: Will not experience complications related to bowel motility Outcome: Progressing   Problem: Safety: Goal: Ability to remain free from injury will improve Outcome: Progressing   "

## 2024-07-05 NOTE — Procedures (Signed)
 Post Hemodialysis Report:   Treatment Date:07/05/24  Treatment Length: 3.5 hours  Patient Access: Right HD CVC  Tolerated treatment with NO fluid removal due to blood pressures soft in 90s most of treatment. No medications with treatment. Access functioned well at prescribed blood flow rate. Consent in bedside chart.  Post Vitals:  Temp: 96.9 Temp Source: axillary Heart Rate: 57 Resp: 16 B/P: 120/56(75) O2: 94% Intubated

## 2024-07-05 NOTE — Procedures (Signed)
" ° ° °  Paracentesis Procedure Note   Paracentesis Procedure Note    INDICATION: Abdominal ascites  PROCEDURE OPERATOR: Shemekia Patane  Ultrasound used to mark location: Yes    CONSENT:   Consent was obtained from [employer] prior to the procedure. Indications, risks, and benefits were explained at length.       PROCEDURE SUMMARY:   A time-out was performed. My hands were washed immediately prior to the procedure. I wore a surgical cap, mask with protective eyewear, sterile gown and sterile gloves throughout the procedure. The area was cleansed and draped in usual sterile fashion using chlorhexidine  scrub. Anesthesia was achieved with 1% lidocaine . The inferior midline of the abdomen was prepped and draped in a sterile fashion using chlorhexidine  scrub. 1% lidocaine  was used to numb the skin, soft tissue and peritoneum. The paracentesis catheter was inserted and advanced with negative pressure until amber colored fluid was aspirated. Approximately 60 mL of ascitic fluid was collected and sent for laboratory analysis. The catheter was then connected to the vaccutainer and 3.5 liters of additional ascitic fluid were drained. The catheter was removed and no leaking was noted. A bandaid was placed over the puncture wound. The patient tolerated the procedure well without any immediate complications. Estimated blood loss was <1cc.      Kailah Pennel, M.D.  Pulmonary & Critical Care Medicine  [facility]   "

## 2024-07-05 NOTE — Progress Notes (Signed)
 "  NAME:  [name], MRN:  [id number], DOB:  [birth year], LOS: 3 ADMISSION DATE:  07/01/2024, CONSULTATION DATE:  07/02/24 REFERRING MD:  Dr. Dorothyann, CHIEF COMPLAINT:  syncope   History of Present Illness:  [name] is a 64 y.o. female with a pmh of of ESRD on HD (MWF), COPD, CHF, bipolar disorder, chronic hypotension on midodrine , HLD and prior CVA who presented to [facility] ED via EMS following syncopal episode.   History was gathered per chart review Per patient, her normal dialysis schedule is MWF.  Patient was recently admitted to the intensive care unit earlier this month July 03, 2024.  At that time she was treated for acute on chronic hypoxemia with circulatory shock since then she has been discharged home and while at home was found down by her family and was brought into the hospital.  After receiving medical management on the mat on the floor and dialysis patient had episode of pulselessness with wide complex tachyarrhythmia.  Rapid response was initiated and ACLS was started on patient.  She received 2 rounds of epinephrine  as well as shock with return of spontaneous circulation.  1 dose of IV amiodarone  was delivered and cardiology was contacted with initiation of amiodarone  infusion.  Patient was brought into the ICU on ventilator and intubated by EDP during CODE BLUE.  07/03/24- overnight, unresposive with AF. MWF(HD). Will meet with family. Will continue to monitor and re-evaluate post dialysis.  07/04/24- patient had few episodes of VT. She is DNR code status.  She was seen by cardiology, she has poor prognosis. No procedures are planned.  07/05/24- patient had hypotensive episode during HD so it was interuppted.  The lidocaine  infusion will be dcd today and amiodarone  will continue.  Plan for paracentesis today.  Pertinent Medical History  Hypotension & Hypertension HLD HFrEF ESRD on HD Stroke CAD s/p CABG x 3 RBBB Pulmonary hypertension PVD Ischemic  cardiomyopathy Hyperkalemia Hypothyroidism Atrial fibrillation on eliquis  Anemia of chronic kidney disease   Significant Hospital Events: Including procedures, antibiotic start and stop dates in addition to other pertinent events   07/02/24-transferred to ICU post CODE BLUE on medical floor status post ACLS with ROSC    Objective    Blood pressure (!) 92/54, pulse (!) 52, temperature (!) 96.8 F (36 C), temperature source Axillary, resp. rate 16, height 5' 6 (1.676 m), weight 75.1 kg, SpO2 92%.    Vent Mode: PRVC FiO2 (%):  [28 %] 28 % Set Rate:  [16 bmp] 16 bmp Vt Set:  [450 mL] 450 mL PEEP:  [5 cmH20] 5 cmH20 Plateau Pressure:  [17 cmH20] 17 cmH20   Intake/Output Summary (Last 24 hours) at 07/05/2024 1114 Last data filed at 07/05/2024 0700 Gross per 24 hour  Intake 2025.38 ml  Output 900 ml  Net 1125.38 ml   Filed Weights   07/04/24 0400 07/05/24 0500 07/05/24 0700  Weight: 74.7 kg 75.1 kg 75.1 kg    Examination: General: Adult female, intubated unresponseve HENT: atraumatic, normocephalic, neck supple  Cardiovascular: ventricular rhythm, S1 S2, no r/m/g Pulm: Regular, non labored on [place] , breath sounds equal throughout GI: soft, non-tender, non-distended, no rebound/guarding, bs x 4 Extremities: warm/dry, pulses + 2 R/P, no edema noted Neuro: GCS4T GU: deferred   Assessment and Plan   # Cardiac arrest Hx: BiVfailure, CAD s/p CABG, Ischemic Cardiomyopathy, CVA       Wide-complex tachyarrhythmia status post ACLS with shock and return of spontaneous circulation.  Patient currently on mechanical  ventilation status post endotracheal intubation.  Cardiology consultation placed-appreciate input Dr. Luanna  infusion initiated        -hx of AF with biventricular failure        -reviewed with cardiologist        - RBBB on EKG         - LR x 1 , digoxin  x 1        - s/p fem CVL on right - levophed  infusion  - Continuous cardiac monitoring -  Vasopressors to maintain MAP goal >65 ~ currently on levophed  gtt - EKG prn - ECHO ordered #COPD - xopenex  #HFrEF #Atrial Fibrillation on Eliquis  #ESRD on HD (MWF)- s/p HD #GERD #Nausea and Vomiting Hx: GERD, HCV, Diverticulosis - Monitor hepatic function as indication - Antiemetics prn - Constipation protocol prn - Diet: NPO except with meds #Chronic Thrombocytopenia #VTE prophylaxis - Trend CBC  - Transfuse if Hgb < 7 or platelets <10 - Monitor for s/sx of bleeding - Continue eliquis  5mg  BID  #Chronic Pain s/t Vascular Ulcers - percocet 5-325 mg x 1 - supportive care   Labs   CBC: Recent Labs  Lab 07/01/24 1336 07/02/24 0526 07/02/24 1646 07/03/24 0543 07/04/24 0441 07/05/24 0439  WBC 12.0* 12.2* 14.2* 14.7* 13.9* 13.7*  NEUTROABS 10.4*  --   --   --   --   --   HGB 12.7 12.3 12.2 12.4 12.1 11.2*  HCT 38.7 37.0 37.3 37.9 35.8* 32.6*  MCV 97.0 95.9 96.4 96.2 93.0 89.8  PLT 82* 68* 70* 63* 73* 68*    Basic Metabolic Panel: Recent Labs  Lab 07/01/24 2155 07/02/24 0526 07/02/24 1646 07/03/24 0543 07/03/24 1847 07/04/24 0441 07/04/24 0727 07/05/24 0439  NA 135 135 136 134*  --  134*  --  131*  K 4.3 4.3 3.9 4.5 3.8 3.9 3.9 3.7  CL 93* 92* 90* 90*  --  92*  --  91*  CO2 19* 23 23 23   --  25  --  25  GLUCOSE 90 106* 154* 169*  --  115*  --  79  BUN 32* 35* 18 26*  --  21  --  36*  CREATININE 5.43* 5.79* 3.57* 4.09*  --  3.18*  --  3.97*  CALCIUM  10.2 10.5* 10.3 10.2  --  9.8  --  10.2  MG 2.1  --   --   --  1.8 1.9 1.9 2.4  PHOS  --   --   --   --   --  2.7  --  2.8   GFR: Estimated Creatinine Clearance: 15.4 mL/min (A) (by C-G formula based on SCr of 3.97 mg/dL (H)). Recent Labs  Lab 07/01/24 1336 07/02/24 0526 07/02/24 1646 07/02/24 1647 07/02/24 2016 07/03/24 0543 07/04/24 0441 07/05/24 0439  WBC 12.0*   < > 14.2*  --   --  14.7* 13.9* 13.7*  LATICACIDVEN 1.9  --   --  5.4* 3.7*  --   --   --    < > = values in this interval not  displayed.    Liver Function Tests: Recent Labs  Lab 07/01/24 1336 07/01/24 2155 07/02/24 0526 07/02/24 1646 07/04/24 0441 07/05/24 0439  AST 36 36 37 54*  --   --   ALT 15 15 14 19   --   --   ALKPHOS 71 68 72 81  --   --   BILITOT 1.2 1.2 1.3* 1.4*  --   --  PROT 7.2 6.8 6.9 6.6  --   --   ALBUMIN  3.8 3.5 3.5 3.5 3.1* 2.9*   No results for input(s): LIPASE, AMYLASE in the last 168 hours. Recent Labs  Lab 07/01/24 1726  AMMONIA 15    ABG    Component Value Date/Time   PHART 7.5 (H) 07/02/2024 2058   PCO2ART 33 07/02/2024 2058   PO2ART 202 (H) 07/02/2024 2058   HCO3 25.7 07/02/2024 2058   TCO2 26 04/20/2024 0708   ACIDBASEDEF 0.2 07/01/2024 2155   O2SAT 99.2 07/02/2024 2058     Coagulation Profile: No results for input(s): INR, PROTIME in the last 168 hours.  Cardiac Enzymes: No results for input(s): CKTOTAL, CKMB, CKMBINDEX, TROPONINI in the last 168 hours.  HbA1C: No results found for: HGBA1C  CBG: Recent Labs  Lab 07/04/24 1127 07/04/24 1939 07/04/24 2335 07/05/24 0442 07/05/24 0745  GLUCAP 143* 101* 87 77 81    Review of Systems:    10 point review of systems unable to conduct due to acutely comatose state on mechanical ventilation  Past Medical History:  She,  has a past medical history of Acute lacunar infarction [facility]) (03/03/2014), Adult behavior problems, Anemia of chronic renal failure, Aortic atherosclerosis, Arthritis, Atrial fibrillation and flutter (HCC), Bipolar disorder (HCC), Cardiomegaly, Cerebral microvascular disease, CHF (congestive heart failure) (HCC), Chicken pox, Cholelithiasis, Chronic respiratory failure with hypoxia (HCC) (12/19/2021), COPD (chronic obstructive pulmonary disease) (HCC), Coronary artery disease, DDD (degenerative disc disease), cervical, DDD (degenerative disc disease), thoracolumbar, Diverticulosis, Dyspnea, ESRD (end stage renal disease) on dialysis (M,W,F), GERD (gastroesophageal reflux  disease), Gout, HCV (hepatitis C virus), Headache, HFrEF (heart failure with reduced ejection fraction) (HCC), History of delirium (04/12/2014), History of substance abuse (HCC), Hyperkalemia (11/13/2014), Hyperlipidemia, Hypertension, Hypotension (02/24/2014), Hypothyroidism, Ischemic cardiomyopathy, Nose colonized with MRSA (02/01/2024), NSTEMI (non-ST elevated myocardial infarction) (HCC) (10/08/2013), NSTEMI (non-ST elevated myocardial infarction) (HCC) (01/23/2002), Obesity, 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, On apixaban  therapy, Peripheral vascular disease, Pneumonia, Postoperative anemia due to acute blood loss (02/27/2014), Postoperative cerebrovascular infarction following cardiac surgery (02/21/2014), Pulmonary hypertension (HCC), RBBB (right bundle branch block), Renal osteodystrophy, S/P CABG x 3 (02/20/2014), Stroke (HCC) (05/23/2010), and Syncope and collapse (07/25/2020).   Surgical History:   Past Surgical History:  Procedure Laterality Date   A/V FISTULAGRAM Left 03/30/2019   Procedure: A/V FISTULAGRAM;  Surgeon: Marea Selinda RAMAN, MD;  Location: [facility];  Service: Cardiovascular;  Laterality: Left;   A/V FISTULAGRAM Left 10/06/2019   Procedure: A/V FISTULAGRAM;  Surgeon: Marea Selinda RAMAN, MD;  Location: [facility];  Service: Cardiovascular;  Laterality: Left;   A/V FISTULAGRAM Left 05/19/2021   Procedure: A/V FISTULAGRAM;  Surgeon: Marea Selinda RAMAN, MD;  Location: [facility];  Service: Cardiovascular;  Laterality: Left;   A/V FISTULAGRAM Left 01/29/2022   Procedure: A/V Fistulagram;  Surgeon: Marea Selinda RAMAN, MD;  Location: [facility];  Service: Cardiovascular;  Laterality: Left;   A/V FISTULAGRAM Left 04/30/2022   Procedure: A/V Fistulagram;  Surgeon: Marea Selinda RAMAN, MD;  Location: [facility];  Service: Cardiovascular;  Laterality: Left;   A/V FISTULAGRAM Left 08/20/2022   Procedure: A/V Fistulagram;  Surgeon: Marea Selinda RAMAN, MD;   Location: [facility];  Service: Cardiovascular;  Laterality: Left;   A/V FISTULAGRAM N/A 12/03/2022   Procedure: A/V Fistulagram;  Surgeon: Marea Selinda RAMAN, MD;  Location: [facility];  Service: Cardiovascular;  Laterality: N/A;   A/V FISTULAGRAM Left 02/11/2023  Procedure: A/V Fistulagram;  Surgeon: Marea Selinda RAMAN, MD;  Location: [facility];  Service: Cardiovascular;  Laterality: Left;   A/V FISTULAGRAM Left 02/07/2024   Procedure: A/V Fistulagram;  Surgeon: Marea Selinda RAMAN, MD;  Location: [facility];  Service: Cardiovascular;  Laterality: Left;   COLONOSCOPY     COLONOSCOPY WITH PROPOFOL  N/A 04/22/2021   Procedure: COLONOSCOPY WITH PROPOFOL ;  Surgeon: Maryruth Ole DASEN, MD;  Location: [facility];  Service: Endoscopy;  Laterality: N/A;   CORONARY ANGIOPLASTY WITH STENT PLACEMENT Left 01/25/2002   Procedure: CORONARY ANGIOPLASTY WITH STENT PLACEMENT; Location: [facility]; Surgeon: Margie Lovelace, MD   CORONARY ARTERY BYPASS GRAFT N/A 02/20/2014   Procedure: CORONARY ARTERY BYPASS GRAFT; Location: [facility]; Surgeon: Reyes Fruits, MD   DIALYSIS FISTULA CREATION     DIALYSIS/PERMA CATHETER INSERTION N/A 04/08/2023   Procedure: DIALYSIS/PERMA CATHETER INSERTION;  Surgeon: Marea Selinda RAMAN, MD;  Location: [facility];  Service: Cardiovascular;  Laterality: N/A;   DIALYSIS/PERMA CATHETER REMOVAL N/A 07/06/2023   Procedure: DIALYSIS/PERMA CATHETER REMOVAL;  Surgeon: [name] Cordella MATSU, MD;  Location: [facility];  Service: Cardiovascular;  Laterality: N/A;   ESOPHAGOGASTRODUODENOSCOPY     ESOPHAGOGASTRODUODENOSCOPY N/A 04/22/2021   Procedure: ESOPHAGOGASTRODUODENOSCOPY (EGD);  Surgeon: Maryruth Ole DASEN, MD;  Location: [facility] ENDOSCOPY;  Service: Endoscopy;  Laterality: N/A;   FLEXIBLE SIGMOIDOSCOPY N/A 02/23/2019   Procedure: FLEXIBLE SIGMOIDOSCOPY;  Surgeon: Gaylyn Gladis PENNER, MD;  Location: [facility] ENDOSCOPY;  Service: Endoscopy;  Laterality: N/A;   IABP  INSERTION  02/20/2014   Procedure: INTRA-AORTIC BALLOON PUMP INSERTION; Location: [facility]; Surgeon: Reyes Fruits, MD   INSERTION OF ARTERIOVENOUS (AV) ARTEGRAFT ARM Right 03/09/2024   Procedure: INSERTION, GRAFT, ARTERIOVENOUS, UPPER EXTREMITY;  Surgeon: Marea Selinda RAMAN, MD;  Location: [facility] OR[facility];  Service: Vascular;  Laterality: Right;  BRACHIAL AXILLARY   INSERTION OF DIALYSIS CATHETER N/A 04/20/2024   Procedure: INSERTION OF DIALYSIS CATHETER;  Surgeon: Marea Selinda RAMAN, MD;  Location: [facility] OR[facility];  Service: Vascular;  Laterality: N/A;   LEFT HEART CATH AND CORONARY ANGIOGRAPHY Left 01/24/2002   Procedure: LEFT HEART CATH AND CORONARY ANGIOGRAPHY; Location: [facility]; Surgeon: Margie Lovelace, MD   LEFT HEART CATH AND CORONARY ANGIOGRAPHY Left 05/25/2012   Procedure: LEFT HEART CATH AND CORONARY ANGIOGRAPHY; Location: [facility]; Surgeon: Margie Lovelace, MD   LEFT HEART CATH AND CORONARY ANGIOGRAPHY Left 10/09/2013   Procedure: LEFT HEART CATH AND CORONARY ANGIOGRAPHY; Location: [facility]; Surgeon: Vinie Jude, MD   LEFT HEART CATH AND CORS/GRAFTS ANGIOGRAPHY N/A 08/08/2018   Procedure: LEFT HEART CATH AND CORS/GRAFTS ANGIOGRAPHY;  Surgeon: Fernand Denyse LABOR, MD;  Location: [facility];  Service: Cardiovascular;  Laterality: N/A;   LEFT HEART CATH AND CORS/GRAFTS ANGIOGRAPHY N/A 01/30/2019   Procedure: LEFT HEART CATH AND CORS/GRAFTS ANGIOGRAPHY;  Surgeon: Fernand Denyse LABOR, MD;  Location: [facility];  Service: Cardiovascular;  Laterality: N/A;   LIGATION ARTERIOVENOUS GORTEX GRAFT Right 04/20/2024   Procedure: LIGATION ARTERIOVENOUS GORTEX GRAFT;  Surgeon: Marea Selinda RAMAN, MD;  Location: [facility];  Service: Vascular;  Laterality: Right;   LOWER EXTREMITY ANGIOGRAPHY Left 04/24/2024   Procedure: Lower Extremity Angiography;  Surgeon: Marea Selinda RAMAN, MD;  Location: [facility];  Service: Cardiovascular;  Laterality: Left;   ultrasound guided pericardiocentesis     UPPER EXTREMITY ANGIOGRAPHY Right  03/30/2024   Procedure: Upper Extremity Angiography;  Surgeon: Marea Selinda RAMAN, MD;  Location: [facility];  Service: Cardiovascular;  Laterality: Right;     Social History:  reports that she has been smoking cigarettes. She has a 5 pack-year smoking history. She has never used smokeless tobacco. She reports that she does not drink alcohol and does not use drugs.   Family History:  Her family history includes Breast cancer in her maternal aunt and maternal grandmother; Cancer in an other family member; Diabetes type II in her maternal grandmother and mother; Hypertension in her father, mother, sister, and another family member; Kidney disease in her father; Ovarian cancer in her mother; Renal Disease in an other family member.   Allergies Allergies[1]   Home Medications  Prior to Admission medications  Medication Sig Start Date End Date Taking? Authorizing Provider  albuterol  (VENTOLIN  HFA) 108 (90 Base) MCG/ACT inhaler Inhale 1-2 puffs into the lungs every 6 (six) hours as needed for wheezing or shortness of breath. 01/04/24   Orlean Alan HERO, FNP  apixaban  (ELIQUIS ) 5 MG TABS tablet Take 5 mg by mouth 2 (two) times daily. 09/11/19   [provider]  cephALEXin  (KEFLEX ) 500 MG capsule Take 1 capsule (500 mg total) by mouth 2 (two) times daily. 05/23/24   Fernand Fredy RAMAN, MD  colchicine  0.6 MG tablet Take 1 tablet (0.6 mg total) by mouth once for 1 dose. Patient not taking: Reported on 05/23/2024 04/13/24 05/19/24  Scoggins, [facility], NP  doxycycline  (VIBRA -TABS) 100 MG tablet Take 1 tablet (100 mg total) by mouth 2 (two) times daily. Patient not taking: Reported on 05/23/2024 05/09/24   Janit Thresa HERO, DPM  hydrOXYzine  (ATARAX /VISTARIL ) 50 MG tablet Take 50 mg by mouth at bedtime.    [provider]  levothyroxine  (SYNTHROID ) 75 MCG tablet Take 75 mcg by mouth daily before breakfast.  11/02/18   [provider]  lidocaine -prilocaine  (EMLA ) cream Apply 1 Application  topically once. 01/22/21   [provider]  metoprolol  succinate (TOPROL -XL) 25 MG 24 hr tablet Take 1 tablet by mouth every morning. 08/27/23 08/26/24  [provider]  midodrine  (PROAMATINE ) 10 MG tablet Take 1 tablet (10 mg total) by mouth 3 (three) times daily. 02/17/24   Fernand Denyse LABOR, MD  oxyCODONE -acetaminophen  (PERCOCET) 5-325 MG tablet Take 1 tablet by mouth 2 (two) times daily as needed for severe pain (pain score 7-10). 05/23/24   Fernand Fredy RAMAN, MD  pantoprazole  (PROTONIX ) 40 MG tablet Take 40 mg by mouth every morning. 11/29/23   [provider]  ranolazine  (RANEXA ) 1000 MG SR tablet Take 1,000 mg by mouth 2 (two) times daily. 03/18/23   [provider]  rosuvastatin  (CRESTOR ) 20 MG tablet Take 20 mg by mouth at bedtime. 11/07/21   [provider]  sevelamer  carbonate (RENVELA ) 800 MG tablet Take 3,200 mg by mouth 3 (three) times daily with meals.    [provider]  triamcinolone  cream (KENALOG ) 0.1 % Apply 1 Application topically 2 (two) times daily. 11/02/23   Orlean Alan HERO, FNP     Critical care provider statement:   Total critical care time: 33 minutes   Performed by: Parris MD   Critical care time was exclusive of separately billable procedures and treating other patients.   Critical care was necessary to treat or prevent imminent or life-threatening deterioration.   Critical care was time spent personally by me on the following activities: development of treatment plan with patient and/or surrogate as well as nursing, discussions with consultants, evaluation of patient's response to treatment, examination of patient, obtaining history from patient or surrogate, ordering and performing treatments and interventions, ordering and review of  laboratory studies, ordering and review of radiographic studies, pulse oximetry and re-evaluation of patient's condition.    Francesca Strome, M.D.  Pulmonary & Critical Care Medicine                 [1]  Allergies Allergen Reactions   Morphine  And Codeine Hives   Levaquin [Levofloxacin] Itching and Other (See Comments)    Severe itching; prickly sensation; severe left leg pain; immobility.   Enalapril  Other (See Comments)    hallucinations   Latex Rash   Tape Rash and Other (See Comments)   "

## 2024-07-05 NOTE — Progress Notes (Addendum)
 "  Patient Name: [name] Date of Encounter: 07/05/2024  Primary Cardiologist: LAVELLA EVALENE DOVER, MD Electrophysiologist: None  Interval Summary   Remains intubated sedated. Receiving dialysis this AM.  Code status adjusted to DNR-limited yesterday with patient's brother   Vital Signs    Vitals:   07/05/24 1015 07/05/24 1030 07/05/24 1045 07/05/24 1100  BP: (!) 113/54 (!) 101/56 (!) 102/55 (!) 92/54  Pulse: (!) 58 (!) 53 (!) 59 (!) 52  Resp: 16 16 16 16   Temp:      TempSrc:      SpO2: 92% 93% 93% 92%  Weight:      Height:        Intake/Output Summary (Last 24 hours) at 07/05/2024 1152 Last data filed at 07/05/2024 1147 Gross per 24 hour  Intake 2055.38 ml  Output 900 ml  Net 1155.38 ml   Filed Weights   07/04/24 0400 07/05/24 0500 07/05/24 0700  Weight: 74.7 kg 75.1 kg 75.1 kg    Physical Exam    GEN- intubated, sedated. Does not respond to voice/touch Lungs- mechanical breath sounds Cardiac- Regular rate and rhythm, no murmurs, rubs or gallops GI- soft, NT, ND, + BS Extremities- no clubbing or cyanosis. trace edema  Telemetry    SB 50-60s, rarely high 40s. Continues to have occasional PVCs, brief NSVT. Ectopy much improved (personally reviewed)  Hospital Course    [name] is a 64 y.o. female with a history of CAD s/p CABG, ESRD on HD, parox AFib, HFrEF (LVEF 25-30%), previous ICD removed d/t infection, PVD, hypotension on midodrine , COPD on home oxygen , chronic pain, CVA, bipolar disorder.   EP consulted for in-hospital cardiac arrest, PMVT, TdP, with ongoing ventricular ectopy  Assessment & Plan    #) HFrEF #) CAD s/p CABG #) PMVT, VF, TdP #) ESRD on HD  Levophed  weaned off overnight Amiodarone  stopped overnight for bradycardia to 50s, HDS, asymptomatic. HR has not dramatically changed since amio stopped, [name] restart Ectopy appears much improved on tele Stop lidocaine  after HD concludes later this AM Keep K > 4, Mag  ~3 Prognosis remains quite guarded   #) parox AFib #) hyper coag d/t AF Amio as above Hold home eliquis , cont heparin     For questions or updates, please contact [name] Please consult [email or web address] for contact info under     Signed, Vanessa Riddle, NP  07/05/2024, 11:52 AM    I have seen the patient via virtual consultation today.  The patient expressed their consent to participate in today's virtual consult. The patient was located at [facility] while I was located at [facility].  I have examined the patient and reviewed the above assessment and plan.     -----------------   I have seen and examined this patient with [name]  Agree with above, note added to reflect my findings.  Patient remains intubated and sedated.  Arrhythmias improved.  GEN: Intubated, sedated Neck: No JVD GI: non-distended  MS: No edema; No deformity  Polymorphic VT/VF End-stage renal disease Coronary artery disease post CABG Chronic systolic heart failure Patient with continued PVCs, but no further episodes of polymorphic VT.  For now, lidocaine  has been stopped.  Would continue amiodarone .  Vanessa Rowand M. Sylva Overley MD 07/05/2024 4:12 PM   Caregility was used to complete today's consult.     Total time of encounter: 35 minutes total time of encounter, including face-to-face patient care, coordination of care and counseling regarding high complexity medical  decision making re: cardiac arrest.   H9574 - ER or Initial Inpatient 30 min H9573 - ER or Initial Inpatient 50 min G0427 - ER or Initial Inpatient 70 min  G0406 - Follow up inpatient consult limited 15 min G0407 - Follow up inpatient consult intermediate 25 min G0408 - Follow up inpatient consult complex 35 min  -GT modifier - via interactive audio and video telecommunication systems     Vanessa Krul Gladis Norton, MD 07/05/2024 4:12 PM  "

## 2024-07-05 NOTE — Consult Note (Signed)
 "                                                                                   Consultation Note Date: 07/05/2024 at 1015  Patient Name: [name]  DOB: 1960/10/29  MRN: [id number]  Age / Sex: 64 y.o., female  PCP: Orlean Alan HERO, FNP Referring Physician: Parris Manna, MD  HPI/Patient Profile: 64 y.o. female  with past medical history of ESRD on HD (M,W,F), COPD, HFrEF (25-30%), bipolar disorder, chronic hypotension on midodrine , HLD, recent hospitalization for fluid overload with cardiogenic shock requiring Levophed  and midodrine  (06/22/2024) and previous CVA admitted on 07/01/2024 with chief complaint of syncopal episode after being being found down by her brother at home.   Per chart review, patient was scheduled for hemodialysis on Friday PTA but could not completed due to unknown reasons.  Upon presentation to the ED, patient was sleepy but arousable, able to make conversation and her wishes known.  Radiograph of chest revealed possible interstitial prominences while radiograph of the pelvis and wrist were negative for fractures.  EKG revealed normal sinus rhythm with RBBB.  CT of brain and cervical spine were without fractures or bleeding.  Afternoon of 1/18, patient underwent dialysis.  Subsequently, unit secretary was notified via telemetry monitoring that patient was in V-fib.  RN found patient unresponsive and CODE BLUE was activated.  Patient was in V-fib arrest.  Patient received 2 rounds of epinephrine , calcium , bicarb, and shock with return of spontaneous circulation.  EDP intubated paient. Patient also given 300 IV amiodarone .  Cardiology was consulted.  Patient was transferred to ICU.  Significant events in ICU are as follows:  07/03/24- overnight, unresposive with AF. MWF(HD). Will meet with family. Will continue to monitor and re-evaluate post dialysis.  07/04/24- patient had few episodes of VT. She is DNR code status.  She was seen by cardiology, she has poor  prognosis. No procedures are planned.  07/05/24- patient had hypotensive episode during HD so it was interuppted.  The lidocaine  infusion will be dcd today and amiodarone  will continue.  Plan for paracentesis today.   PMT was consulted to support patient with family with goals of care discussions.  Of note, patient is familiar to our service as my colleagues met with patient during her most recent hospitalization-06/22/2024.  Clinical Assessment and Goals of Care: Chart review completed prior to meeting patient including: -Labs: Creatinine 3.97-predialysis treatment today, WBC minimally elevated at 13.7, -Vital signs: Patient remains hypotensive with borderline bradycardia on full ventilatory support -Imaging: Radiographs of abdomen 1/20 reveals no obstruction with normal bowel gas pattern -Progress notes: Reviewed palliative medicine team note from previous hospitalization wherein patient named her brother Fairy as her next of kin decision maker but declined to create an official advanced directive document -Orders: Paracentesis planned for today -Available advanced directive documents from current and previous encounters: No advance directive available in ACP tab of epic  I then met with patient at bedside.  She is intubated and sedated currently receiving CRRT.  She remains unable to participate in goals of care or medical decision making independently at this time.   After assessing the patient, I counseled with  dialysis nurse at bedside and as well as patient's ICU RN.  Dialysis nurse endorses no fluid is being removed/net 0 during CRRT today.  Patient's blood pressures became too low when fluid was attempted to be removed earlier.  RN advises that patient's brother visits often.  He will notify me when family is at bedside.  Of note, a member of the palliative medicine team met with patient during her previous hospitalization on 1/10.  At that time, patient expressed her wishes for full  code and full scope, accepting of all offered, available, and appropriate medical interventions to extend her life.  Additionally, at that time patient named her brother Fairy as her next of kin management consultant.  As per chart review, patient's brother changed patient's CODE STATUS to DNR with limited interventions the evening of 1/18.  I plan to meet in person bedside with patient's family if/when they arrive today to discuss diagnosis prognosis, GOC, EOL wishes, disposition and options.  No change to plan of care at this time.  Primary Decision Maker NEXT OF KIN  Physical Exam Vitals reviewed.  Constitutional:      General: She is not in acute distress.    Appearance: She is ill-appearing.  HENT:     Head: Normocephalic.  Cardiovascular:     Rate and Rhythm: Normal rate.  Pulmonary:     Comments: Ventilatory support Abdominal:     Palpations: Abdomen is soft.  Skin:    General: Skin is warm and dry.  Neurological:     Comments: Sedated, UTA mental status     Palliative Assessment/Data: 30%     Thank you for this consult. Palliative medicine will continue to follow and assist holistically.   I personally spent a total of 40 minutes in the care of the patient today including preparing to see the patient, getting/reviewing separately obtained history, performing a medically appropriate exam/evaluation, referring and communicating with other health care professionals, and documenting clinical information in the EHR.   Signed by: Lamarr Gunner, DNP, FNP-BC Palliative Medicine   Please contact Palliative Medicine Team providers via [facility] for questions and concerns.                "

## 2024-07-06 DIAGNOSIS — W19XXXA Unspecified fall, initial encounter: Secondary | ICD-10-CM | POA: Diagnosis not present

## 2024-07-06 DIAGNOSIS — N186 End stage renal disease: Secondary | ICD-10-CM

## 2024-07-06 DIAGNOSIS — R55 Syncope and collapse: Secondary | ICD-10-CM | POA: Diagnosis not present

## 2024-07-06 DIAGNOSIS — I469 Cardiac arrest, cause unspecified: Secondary | ICD-10-CM | POA: Diagnosis not present

## 2024-07-06 DIAGNOSIS — I255 Ischemic cardiomyopathy: Secondary | ICD-10-CM | POA: Diagnosis not present

## 2024-07-06 DIAGNOSIS — Z992 Dependence on renal dialysis: Secondary | ICD-10-CM

## 2024-07-06 DIAGNOSIS — B9562 Methicillin resistant Staphylococcus aureus infection as the cause of diseases classified elsewhere: Secondary | ICD-10-CM

## 2024-07-06 DIAGNOSIS — R7881 Bacteremia: Secondary | ICD-10-CM

## 2024-07-06 LAB — RENAL FUNCTION PANEL
Albumin: 3 g/dL — ABNORMAL LOW (ref 3.5–5.0)
Anion gap: 14 (ref 5–15)
BUN: 29 mg/dL — ABNORMAL HIGH (ref 8–23)
CO2: 26 mmol/L (ref 22–32)
Calcium: 10 mg/dL (ref 8.9–10.3)
Chloride: 90 mmol/L — ABNORMAL LOW (ref 98–111)
Creatinine, Ser: 2.83 mg/dL — ABNORMAL HIGH (ref 0.44–1.00)
GFR, Estimated: 18 mL/min — ABNORMAL LOW
Glucose, Bld: 96 mg/dL (ref 70–99)
Phosphorus: 2.8 mg/dL (ref 2.5–4.6)
Potassium: 4 mmol/L (ref 3.5–5.1)
Sodium: 130 mmol/L — ABNORMAL LOW (ref 135–145)

## 2024-07-06 LAB — CBC
HCT: 33.7 % — ABNORMAL LOW (ref 36.0–46.0)
Hemoglobin: 11.7 g/dL — ABNORMAL LOW (ref 12.0–15.0)
MCH: 31 pg (ref 26.0–34.0)
MCHC: 34.7 g/dL (ref 30.0–36.0)
MCV: 89.4 fL (ref 80.0–100.0)
Platelets: 78 K/uL — ABNORMAL LOW (ref 150–400)
RBC: 3.77 MIL/uL — ABNORMAL LOW (ref 3.87–5.11)
RDW: 18.1 % — ABNORMAL HIGH (ref 11.5–15.5)
WBC: 15.5 K/uL — ABNORMAL HIGH (ref 4.0–10.5)
nRBC: 1.2 % — ABNORMAL HIGH (ref 0.0–0.2)

## 2024-07-06 LAB — GLUCOSE, CAPILLARY
Glucose-Capillary: 100 mg/dL — ABNORMAL HIGH (ref 70–99)
Glucose-Capillary: 104 mg/dL — ABNORMAL HIGH (ref 70–99)
Glucose-Capillary: 111 mg/dL — ABNORMAL HIGH (ref 70–99)
Glucose-Capillary: 111 mg/dL — ABNORMAL HIGH (ref 70–99)
Glucose-Capillary: 116 mg/dL — ABNORMAL HIGH (ref 70–99)
Glucose-Capillary: 121 mg/dL — ABNORMAL HIGH (ref 70–99)
Glucose-Capillary: 122 mg/dL — ABNORMAL HIGH (ref 70–99)

## 2024-07-06 LAB — TRIGLYCERIDES: Triglycerides: 155 mg/dL — ABNORMAL HIGH

## 2024-07-06 LAB — APTT
aPTT: 61 s — ABNORMAL HIGH (ref 24–36)
aPTT: 86 s — ABNORMAL HIGH (ref 24–36)
aPTT: 72 s — ABNORMAL HIGH (ref 24–36)

## 2024-07-06 LAB — MAGNESIUM: Magnesium: 2.1 mg/dL (ref 1.7–2.4)

## 2024-07-06 LAB — HEPARIN LEVEL (UNFRACTIONATED): Heparin Unfractionated: 0.53 [IU]/mL (ref 0.30–0.70)

## 2024-07-06 MED ORDER — VANCOMYCIN HCL 750 MG/150ML IV SOLN
750.0000 mg | INTRAVENOUS | Status: DC
  Administered 2024-07-07: 750 mg via INTRAVENOUS
  Filled 2024-07-06 (×2): qty 150

## 2024-07-06 MED ORDER — HEPARIN BOLUS VIA INFUSION
1100.0000 [IU] | Freq: Once | INTRAVENOUS | Status: AC
  Administered 2024-07-06: 1100 [IU] via INTRAVENOUS
  Filled 2024-07-06: qty 1100

## 2024-07-06 MED ORDER — VANCOMYCIN HCL 1500 MG/300ML IV SOLN
1500.0000 mg | Freq: Once | INTRAVENOUS | Status: AC
  Administered 2024-07-06: 1500 mg via INTRAVENOUS
  Filled 2024-07-06: qty 300

## 2024-07-06 NOTE — Progress Notes (Signed)
 " [facility]  PROGRESS NOTE   Subjective:   Patient seen at bedside.   Patient remains critically ill, intubated and sedated.  Irregular rhythm noted Off pressors at the moment. Low-dose tube feeds continued.  Her brother is at bedside.  He reports that she was able to open eyes earlier today.  Off sedation.  Heparin  drip Tube feeds Ventilator assisted, FiO2 28%, PEEP 5 She appears to have developed dependent edema over her arms as well as leg.   Objective:  Vital signs: Blood pressure 115/70, pulse (!) 50, temperature 97.9 F (36.6 C), temperature source Axillary, resp. rate (!) 22, height 5' 6 (1.676 m), weight 75.8 kg, SpO2 99%.  Intake/Output Summary (Last 24 hours) at 07/06/2024 0921 Last data filed at 07/06/2024 9386 Gross per 24 hour  Intake 1527.77 ml  Output 0 ml  Net 1527.77 ml   Filed Weights   07/05/24 0700 07/05/24 1115 07/06/24 0500  Weight: 75.1 kg 75.1 kg 75.8 kg     Physical Exam: General:  No acute distress  Head:  Normocephalic, atraumatic.  ET tube, OG tube in place  Eyes:  Anicteric  Lungs:   Clear to auscultation, ventilator assisted  Heart:  S1S2 no rubs  Abdomen:   Soft, distended, hypoactive bowel sounds  Extremities:  + peripheral edema.  Neurologic:  sedated  Skin:  Left foot wound  Access: Right IJ dialysis catheter    Basic Metabolic Panel: Recent Labs  Lab 07/02/24 1646 07/03/24 0543 07/03/24 1847 07/04/24 0441 07/04/24 0727 07/05/24 0439 07/06/24 0407  NA 136 134*  --  134*  --  131* 130*  K 3.9 4.5 3.8 3.9 3.9 3.7 4.0  CL 90* 90*  --  92*  --  91* 90*  CO2 23 23  --  25  --  25 26  GLUCOSE 154* 169*  --  115*  --  79 96  BUN 18 26*  --  21  --  36* 29*  CREATININE 3.57* 4.09*  --  3.18*  --  3.97* 2.83*  CALCIUM  10.3 10.2  --  9.8  --  10.2 10.0  MG  --   --  1.8 1.9 1.9 2.4 2.1  PHOS  --   --   --  2.7  --  2.8 2.8   GFR: Estimated Creatinine Clearance: 21.7 mL/min (A) (by C-G formula based on SCr of 2.83  mg/dL (H)).  Liver Function Tests: Recent Labs  Lab 07/01/24 1336 07/01/24 2155 07/02/24 0526 07/02/24 1646 07/04/24 0441 07/05/24 0439 07/06/24 0407  AST 36 36 37 54*  --   --   --   ALT 15 15 14 19   --   --   --   ALKPHOS 71 68 72 81  --   --   --   BILITOT 1.2 1.2 1.3* 1.4*  --   --   --   PROT 7.2 6.8 6.9 6.6  --   --   --   ALBUMIN  3.8 3.5 3.5 3.5 3.1* 2.9* 3.0*   No results for input(s): LIPASE, AMYLASE in the last 168 hours. Recent Labs  Lab 07/01/24 1726  AMMONIA 15    CBC: Recent Labs  Lab 07/01/24 1336 07/02/24 0526 07/02/24 1646 07/03/24 0543 07/04/24 0441 07/05/24 0439 07/06/24 0407  WBC 12.0*   < > 14.2* 14.7* 13.9* 13.7* 15.5*  NEUTROABS 10.4*  --   --   --   --   --   --  HGB 12.7   < > 12.2 12.4 12.1 11.2* 11.7*  HCT 38.7   < > 37.3 37.9 35.8* 32.6* 33.7*  MCV 97.0   < > 96.4 96.2 93.0 89.8 89.4  PLT 82*   < > 70* 63* 73* 68* 78*   < > = values in this interval not displayed.     HbA1C: No results found for: HGBA1C  Urinalysis: No results for input(s): COLORURINE, LABSPEC, PHURINE, GLUCOSEU, HGBUR, BILIRUBINUR, KETONESUR, PROTEINUR, UROBILINOGEN, NITRITE, LEUKOCYTESUR in the last 72 hours.  Invalid input(s): APPERANCEUR    Imaging: DG Abd 1 View Result Date: 07/04/2024 CLINICAL DATA:  Abdominal distension. EXAM: ABDOMEN - 1 VIEW COMPARISON:  Abdominal radiograph dated 07/02/2024. FINDINGS: No bowel dilatation or evidence of obstruction. No free air or radiopaque calculi. Atherosclerotic calcification of the abdominal aorta and iliac arteries. A cannula is noted to the right of the spine. Calcified uterine fibroid. No acute osseous pathology. IMPRESSION: Nonobstructive bowel gas pattern. Electronically Signed   By: Vanetta Chou M.D.   On: 07/04/2024 13:15     Medications:    amiodarone  30 mg/hr (07/06/24 0600)   cefTRIAXone  (ROCEPHIN )  IV Stopped (07/05/24 2045)   heparin  1,450 Units/hr (07/06/24 0600)    norepinephrine  (LEVOPHED ) Adult infusion Stopped (07/04/24 2343)   propofol  (DIPRIVAN ) infusion 30 mcg/kg/min (07/06/24 0600)   vasopressin  Stopped (07/03/24 2206)    Chlorhexidine  Gluconate Cloth  6 each Topical Daily   collagenase   1 Application Topical Daily   feeding supplement (PROSource TF20)  60 mL Per Tube Daily   feeding supplement (VITAL AF 1.2 CAL)  1,000 mL Per Tube Q24H   free water   30 mL Per Tube Q4H   levothyroxine   75 mcg Oral Q0600   midodrine   10 mg Oral TID WC   multivitamin  1 tablet Per Tube QHS   nutrition supplement (JUVEN)  1 packet Per Tube BID BM   mouth rinse  15 mL Mouth Rinse Q2H   pantoprazole  (PROTONIX ) IV  40 mg Intravenous Daily   sodium chloride  flush  10-40 mL Intracatheter Q12H   venlafaxine   37.5 mg Oral BID WC    Assessment/ Plan:     64 year old female with history of coronary disease history of CABG, ESRD, COPD, atrial fibrillation on hemodialysis Monday Wednesday and Friday now admitted with history of syncope/fall.  In hospital cardiac arrest on Sunday, 07/02/2024 Outpatient dialysis: [facility]/MWF/right PermCath/72.5 kg    ESRD: HD Monday Wednesday and Friday.  Off pressors now.  Tolerating intermittent hemodialysis.  Continue MWF schedule.  Goal UF 2 L tomorrow.  ANEMIA of chronic kidney disease: Hemoglobin acceptable at present.   Will continue to monitor closely.   MBD: Will check PTH, calcium  and phosphorus levels.  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  Will continue sodium thiosulfate  when she stabilizes.   Hypotension/VOL/edema: Off pressors now.  Continues to have A-fib with RVR.  Cardiology team following along for chronic systolic CHF.  Acute respiratory failure-requiring ventilator postcardiac arrest on 07/02/2024.  FiO2 28%.    Labs and medications reviewed. Will continue to follow along with you.   LOS: 4 Saralee Stank, MD [facility] 1/22/20269:21 AM  "

## 2024-07-06 NOTE — Progress Notes (Signed)
 [facility] CLINIC CARDIOLOGY PROGRESS NOTE   Patient ID: [name] MRN: [id number] DOB/AGE: 62-25-1962 62 y.o.  Admit date: 07/01/2024 Referring Physician Dr. Devaughn Ban  Primary Physician Orlean Alan HERO, FNP  Primary Cardiologist Dr. Denyse Bathe Reason for Consultation syncope  HPI: [name] is a 64 y.o. female with a past medical history of chronic HFrEF, coronary artery disease, atrial fibrillation on eliquis , COPD, ESRD on HD, bipolar disorder, hypotension on midodrine , hx CVA  who presented to the ED on 07/01/2024 for syncopal event at home. Cardiology was consulted for further evaluation.   Interval History:  -Patient seen and examined this AM, remains intubated and sedated. Doing overall better per PCCM team, planning for wake trial today to see how she tolerates.  - Lidocaine  discontinued yesterday and amio restarted. One episode of NSVT this AM but otherwise improved ectopy. - BP remains stable off pressors.   Review of systems complete and found to be negative unless listed above   Vitals:   07/06/24 0700 07/06/24 0715 07/06/24 0730 07/06/24 0745  BP: 107/65 118/64 107/63 (!) 105/55  Pulse: (!) 34 (!) 57 (!) 35 (!) 33  Resp: 18 16 18 18   Temp:      TempSrc:      SpO2: 100% 100% 99% 100%  Weight:      Height:         Intake/Output Summary (Last 24 hours) at 07/06/2024 0755 Last data filed at 07/06/2024 9386 Gross per 24 hour  Intake 1527.77 ml  Output 0 ml  Net 1527.77 ml     PHYSICAL EXAM General: Ill appearing female, intubated/sedated. HEENT: Normocephalic and atraumatic. Neck: No JVD.  Lungs: Mechanical breath sounds Heart: HRRR. Normal S1 and S2 without gallops or murmurs. Radial & DP pulses 2+ bilaterally. Abdomen: Non-distended appearing.  Msk: Normal strength and tone for age. Extremities: No clubbing, cyanosis or edema.     LABS: Basic Metabolic Panel: Recent Labs    07/05/24 0439 07/06/24 0407  NA 131* 130*  K 3.7 4.0  CL  91* 90*  CO2 25 26  GLUCOSE 79 96  BUN 36* 29*  CREATININE 3.97* 2.83*  CALCIUM  10.2 10.0  MG 2.4 2.1  PHOS 2.8 2.8   Liver Function Tests: Recent Labs    07/05/24 0439 07/06/24 0407  ALBUMIN  2.9* 3.0*   No results for input(s): LIPASE, AMYLASE in the last 72 hours. CBC: Recent Labs    07/05/24 0439 07/06/24 0407  WBC 13.7* 15.5*  HGB 11.2* 11.7*  HCT 32.6* 33.7*  MCV 89.8 89.4  PLT 68* 78*   Cardiac Enzymes: No results for input(s): CKTOTAL, CKMB, CKMBINDEX, TROPONINIHS in the last 72 hours. BNP: No results for input(s): BNP in the last 72 hours. D-Dimer: No results for input(s): DDIMER in the last 72 hours. Hemoglobin A1C: No results for input(s): HGBA1C in the last 72 hours. Fasting Lipid Panel: Recent Labs    07/06/24 0407  TRIG 155*   Thyroid  Function Tests: No results for input(s): TSH, T4TOTAL, T3FREE, THYROIDAB in the last 72 hours.  Invalid input(s): FREET3 Anemia Panel: No results for input(s): VITAMINB12, FOLATE, FERRITIN, TIBC, IRON, RETICCTPCT in the last 72 hours.  DG Abd 1 View Result Date: 07/04/2024 CLINICAL DATA:  Abdominal distension. EXAM: ABDOMEN - 1 VIEW COMPARISON:  Abdominal radiograph dated 07/02/2024. FINDINGS: No bowel dilatation or evidence of obstruction. No free air or radiopaque calculi. Atherosclerotic calcification of the abdominal aorta and iliac arteries. A cannula is noted to the right  of the spine. Calcified uterine fibroid. No acute osseous pathology. IMPRESSION: Nonobstructive bowel gas pattern. Electronically Signed   By: Vanetta Chou M.D.   On: 07/04/2024 13:15     ECHO 06/23/2024: 1. Left ventricular ejection fraction, by estimation, is 25 to 30%. The left ventricle has severely decreased function. The left ventricle demonstrates global hypokinesis. The left ventricular internal cavity size was severely dilated. Left ventricular diastolic function could not be evaluated.   2.  Right ventricular systolic function is severely reduced. The right  ventricular size is severely enlarged. Mildly increased right ventricular  wall thickness.   3. Left atrial size was mildly dilated.   4. Right atrial size was severely dilated.   5. The mitral valve is normal in structure. Mild mitral valve regurgitation.   6. Tricuspid valve regurgitation is severe.   7. The aortic valve is grossly normal. Aortic valve regurgitation is not visualized. Aortic valve sclerosis is present, with no evidence of aortic  valve stenosis.   TELEMETRY (personally reviewed): Sinus rhythm rate 60s  EKG (personally reviewed): AF RBBB rate 167 bpm  DATA reviewed by me 07/06/24: last 24h vitals tele labs imaging I/O, hospitalist progress note  Principal Problem:   Syncope and collapse Active Problems:   ESRD on hemodialysis (HCC)   Chronic hypotension   S/P CABG x 3   Mild dementia associated with other underlying disease, without behavioral disturbance, psychotic disturbance, mood disturbance, or anxiety (HCC)   Anemia of chronic disease   Paroxysmal atrial fibrillation (HCC)   Hypothyroidism   Chronic combined systolic and diastolic CHF (congestive heart failure) (HCC)   Hepatitis C antibody positive in blood   Other cirrhosis of liver (HCC)   Hypertension   Bipolar disorder in full remission, most recent episode unspecified type   Chronic pain syndrome   Other secondary kyphosis, cervical region   Cervical spondylosis   Fall   Acute pulmonary edema (HCC)   Bacteremia due to Streptococcus    ASSESSMENT AND PLAN: [name] is a 64 y.o. female with a past medical history of chronic HFrEF, coronary artery disease, atrial fibrillation on eliquis , COPD, ESRD on HD, bipolar disorder, hypotension on midodrine , hx CVA  who presented to the ED on 07/01/2024 for syncopal event at home. Cardiology was consulted for further evaluation.   # Atrial fibrillation RVR # Paroxysmal atrial  fibrillation # Syncope/ ?cardiac arrest # Chronic HFrEF # Coronary artery disease Patient brought to ED after being found down at home by family. Initially was admitted to the floor but on 07/02/24 she became unresponsive and was tachycardia, concern for wide complex tachycardia but has RBBB at baseline. Underwent ACLS, received 1 shock and 2 doses of epi with ROSC. Started on IV amiodarone .  Lidocaine  started 07/04/2024 by EP.  Overnight on 07/05/2024 developed bradycardia and thus IV amiodarone  was discontinued. 07/05/24 EP discontinued lidocaine  and restarted amiodarone .  -EP following, appreciate their assistance.  -Continue IV heparin .  -Further management per primary team. No plan for invasive cardiac procedures at this time. -Agree with palliative care evaluation for continued goals of care conversations.  This patient's case was discussed and created with Dr. Florencio and he is in agreement.  Signed:  Danita Bloch, PA-C  07/06/2024, 7:55 AM [facility]

## 2024-07-06 NOTE — TOC Initial Note (Signed)
 Transition of Care [facility]) - Initial/Assessment Note    Patient Details  Name: [name] MRN: [id number] Date of Birth: 03/26/1961  Transition of Care [facility]) CM/SW Contact:    Lauraine JAYSON Carpen, LCSW Phone Number: 07/06/2024, 11:57 AM  Clinical Narrative:   TOC completed readmission prevention screen on 1/12:  Chart reviewed. I was able to speak with the patient at bedside today. I introduced myself, my role, and reason for visit. The patient PCP is Alan Arrant. The patient reports that she lives by herself.    The patient reports that she was able to complete task independently. The patient reports that her brother drives her to medical appointments. The patient reports that her brother will assidt her during D/C.    The patient reports that she uses [facility] pharmacy. The patient reports that she is currently active with [facility]. The patient reports that she has at [facility] health care almost a week ago and was there for a week. The patient reports that she does not want to go back into a SNF.    The patient reports that she has a cane, walker, shower seat and BSC in the home. The patient has no concerns or questions during the time of the visit.    SW called Channing with [employer] and she confirmed that the patient is active and receives nursing, PT/OT.   CSW called [facility] liaison and confirmed patient is still receiving RN, PT, and OT. No further concerns. CSW will continue to follow patient for support and facilitate discharge once medically stable.               Expected Discharge Plan: Home w Home Health Services Barriers to Discharge: Continued Medical Work up   Patient Goals and CMS Choice            Expected Discharge Plan and Services       Living arrangements for the past 2 months: Single Family Home                           HH Arranged: RN, PT, OT [facility] Agency: [employer] [facility] Date [facility] Agency Contacted: 07/06/24    Representative spoke with at [facility] Agency: Channing  Prior Living Arrangements/Services Living arrangements for the past 2 months: Single Family Home Lives with:: Self Patient language and need for interpreter reviewed:: Yes        Need for Family Participation in Patient Care: Yes (Comment)   Current home services: DME, Home OT, Home PT, Home RN Criminal Activity/Legal Involvement Pertinent to Current Situation/Hospitalization: No - Comment as needed  Activities of Daily Living   ADL Screening (condition at time of admission) Independently performs ADLs?: Yes (appropriate for developmental age) Is the patient deaf or have difficulty hearing?: No Does the patient have difficulty seeing, even when wearing glasses/contacts?: No Does the patient have difficulty concentrating, remembering, or making decisions?: No  Permission Sought/Granted                  Emotional Assessment   Attitude/Demeanor/Rapport: Unable to Assess Affect (typically observed): Unable to Assess   Alcohol / Substance Use: Not Applicable Psych Involvement: No (comment)  Admission diagnosis:  Acidosis [E87.20] Syncope and collapse [R55] Acute pulmonary edema (HCC) [J81.0] Hypoglycemia [E16.2] Right wrist pain [M25.531] ESRD (end stage renal disease) on dialysis (HCC) [N18.6, Z99.2] Fall, initial encounter [W19.XXXA] Patient Active Problem List   Diagnosis Date Noted   Fall  07/05/2024   Acute pulmonary edema (HCC) 07/05/2024   Bacteremia due to Streptococcus 07/05/2024   Other secondary kyphosis, cervical region 07/03/2024   Cervical spondylosis 07/03/2024   Syncope and collapse 07/01/2024   Syncope 06/22/2024   Acute idiopathic gout of left foot 04/13/2024   Bunion of great toe 04/13/2024   Other psychoactive substance abuse, uncomplicated (HCC) 03/15/2024   Bipolar disorder in full remission, most recent episode unspecified type 03/15/2024   Chronic pain syndrome 03/15/2024   De Quervain's  tenosynovitis, right 07/06/2023   NPDH (new persistent daily headache) 07/06/2023   Chest pain 05/17/2023   Hypertension    Physical deconditioning 03/27/2023   Hallux valgus of right foot 01/07/2023   Pain of left lower extremity 01/07/2023   Peripheral vascular disease of lower extremity 01/02/2023   Other cirrhosis of liver (HCC) 10/08/2022   Ascites 12/19/2021   Hepatitis C antibody positive in blood 12/19/2021   Thrombocytopenia 08/08/2021   Paroxysmal atrial fibrillation (HCC)    Hypothyroidism    Swelling of limb 09/22/2016   Generalized weakness 10/15/2014   ESRD on hemodialysis (HCC) 10/15/2014   Chronic hypotension 10/15/2014   Chronic combined systolic and diastolic CHF (congestive heart failure) (HCC) 10/15/2014   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   Pericardial effusion 04/12/2014   Chest wall pain following surgery 04/12/2014   Lactic acidosis 03/26/2014   Mild dementia associated with other underlying disease, without behavioral disturbance, psychotic disturbance, mood disturbance, or anxiety (HCC) 03/25/2014   Atrial flutter (HCC) 03/22/2014   Superficial incisional surgical site infection 03/14/2014   Exhausted vascular access 02/24/2014   Anemia of chronic disease 02/24/2014   S/P CABG x 3 02/21/2014   Hyperlipidemia 02/13/2014   History of hepatitis C 02/13/2014   HFrEF (heart failure with reduced ejection fraction) (HCC) 02/13/2014   Retrosternal chest pain 05/30/2013   Gallstones 05/29/2013   PCP:  Orlean Alan HERO, FNP Pharmacy:   [facility] - [place], [place] - [address] [place] [address] Phone: [phone] Fax: [phone]     Social Drivers of Health (SDOH) Social History: SDOH Screenings   Food Insecurity: No Food Insecurity (07/02/2024)  Housing: Low Risk (07/02/2024)  Transportation Needs: No Transportation Needs (07/02/2024)  Utilities: Not At Risk (07/02/2024)  Depression (PHQ2-9):  Low Risk (07/06/2023)  Financial Resource Strain: Low Risk  (05/31/2024)   Received from [facility]  Tobacco Use: High Risk (07/01/2024)   SDOH Interventions:     Readmission Risk Interventions    07/06/2024   11:55 AM 06/26/2024   12:20 PM 12/23/2021   11:41 AM  Readmission Risk Prevention Plan  Transportation Screening Complete Complete Complete  PCP or Specialist Appt within 3-5 Days  Complete   HRI or Home Care Consult  Complete Complete  Social Work Consult for Recovery Care Planning/Counseling  Complete Complete  Palliative Care Screening  Not Applicable Not Applicable  Medication Review [employer]) Complete Complete Complete  PCP or Specialist appointment within 3-5 days of discharge Complete    HRI or Home Care Consult Complete    SW Recovery Care/Counseling Consult Complete    Palliative Care Screening Not Applicable    Skilled Nursing Facility Not Applicable

## 2024-07-06 NOTE — Progress Notes (Signed)
 PHARMACY - ANTICOAGULATION CONSULT NOTE  Pharmacy Consult for UFH infusion Indication: atrial fibrillation  Allergies[1]  Patient Measurements: Height: 5' 6 (167.6 cm) Weight: 75.8 kg (167 lb 1.7 oz) IBW/kg (Calculated) : 59.3 HEPARIN  DW (KG): 70.5  Labs: Recent Labs    07/04/24 0441 07/04/24 1327 07/05/24 0439 07/05/24 0800 07/05/24 0820 07/06/24 0407 07/06/24 1309 07/06/24 2107  HGB 12.1  --  11.2*  --   --  11.7*  --   --   HCT 35.8*  --  32.6*  --   --  33.7*  --   --   PLT 73*  --  68*  --   --  78*  --   --   APTT 53*   < >  --  86*  --  61* 86* 72*  LABPROT  --   --   --   --  16.3*  --   --   --   INR  --   --   --   --  1.2  --   --   --   HEPARINUNFRC >1.10*  --   --  0.82*  --  0.53  --   --   CREATININE 3.18*  --  3.97*  --   --  2.83*  --   --    < > = values in this interval not displayed.    Estimated Creatinine Clearance: 21.7 mL/min (A) (by C-G formula based on SCr of 2.83 mg/dL (H)).   Medical History: Past Medical History:  Diagnosis Date   Acute lacunar infarction (HCC) 03/03/2014   a.) MRI brain 03/03/2014: two acute punctate lacunar infarcts anteriorly in the BILATERAL centrum semiovale   Adult behavior problems    Anemia of chronic renal failure    Aortic atherosclerosis    Arthritis    Atrial fibrillation and flutter (HCC)    a.) CHA2DS2VASc = 6 (sex, HFrEF, HTN, CVA x 2, prior MI/vascular disease) as of 02/04/2024; b.) rate/rhythm maintained on oral metoprolol  succinate; chronically anticoagulated with apixaban    Bipolar disorder (HCC)    Cardiomegaly    Cerebral microvascular disease    CHF (congestive heart failure) (HCC)    Chicken pox    Cholelithiasis    Chronic respiratory failure with hypoxia (HCC) 12/19/2021   COPD (chronic obstructive pulmonary disease) (HCC)    Coronary artery disease    DDD (degenerative disc disease), cervical    DDD (degenerative disc disease), thoracolumbar    Diverticulosis    Dyspnea    ESRD (end  stage renal disease) on dialysis (M,W,F)    GERD (gastroesophageal reflux disease)    Gout    HCV (hepatitis C virus)    Headache    HFrEF (heart failure with reduced ejection fraction) (HCC)    History of delirium 04/12/2014   History of substance abuse (HCC)    a.) tobacco + cocaine   Hyperkalemia 11/13/2014   Hyperlipidemia    Hypertension    Hypotension 02/24/2014   Hypothyroidism    Ischemic cardiomyopathy    Nose colonized with MRSA 02/01/2024   a.) presurgical PCR (+) on 02/01/2024 prior to A/V FISTULAGRAM; b.) presurgical PCR (+) on 03/07/2024 prior to AV FISTULA CREATION   NSTEMI (non-ST elevated myocardial infarction) (HCC) 10/08/2013   NSTEMI (non-ST elevated myocardial infarction) (HCC) 01/23/2002   a.) LHC 01/24/2002 --> multi-vessel CAD with IRA being 90% OM2 --> delayed PCI until 01/25/2002 --> 3.0 x 23 mm BX Velocity stent   Obesity  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    On apixaban  therapy    Peripheral vascular disease    Pneumonia    Postoperative anemia due to acute blood loss 02/27/2014   Postoperative cerebrovascular infarction following cardiac surgery 02/21/2014   a.) developed RIGHT upper/lower extremity weakness following cardiac revascularization --> neuro consult and head CT --> Hypodensity noted in the area of the LEFT motor cortex consistent with a component of acute ischemia   Pulmonary hypertension (HCC)    RBBB (right bundle branch block)    Renal osteodystrophy    S/P CABG x 3 02/20/2014   a.) LIMA-LAD, SVG-OM1, SVG-PDA   Stroke (HCC) 05/23/2010   a.) brain MRI 05/23/2010: BILATERAL acute infarcts and old prior infarcts; RIGHT frontal lobe extending into the white matter. Small focus of restricted diffusion in the cortex of the medial LEFT frontoparietal lobe. Periventricular T2 and multiple foci of T2 hyperintensity in the subcortical white matter and increased FLAIR signal in the cortex of the LEFT frontal lobe.   Syncope and  collapse 07/25/2020   Assessment: 64 y/o F with a h/o atrial fibrillation on apixaban . Last dose was 1/18 at 0900. Patient coded on Jul 25, 2024 with subsequent ROSC and is now in the ICU on ventilation and pressor support. Pharmacy consulted for IV heparin .   Goal of Therapy:  anti-Xa level  0.3-0.7 units/ml aPTT 66-102 seconds Monitor platelets by anticoagulation protocol: Yes   Plan:  --aPTT is therapeutic x 2 --Continue heparin  infusion at 1450 units/hr --Re-check aPTT and HL tomorrow AM  --Transition to anti-Xa levels when correlating --Daily CBC  Marolyn KATHEE [name] 07/06/2024 9:44 PM    [1]  Allergies Allergen Reactions   Morphine  And Codeine Hives   Levaquin [Levofloxacin] Itching and Other (See Comments)    Severe itching; prickly sensation; severe left leg pain; immobility.   Enalapril  Other (See Comments)    hallucinations   Latex Rash   Tape Rash and Other (See Comments)

## 2024-07-06 NOTE — Progress Notes (Signed)
 Pharmacy Antibiotic Note  [name] is a 64 y.o. female admitted on 07/01/2024 with bacteremia.  Pharmacy has been consulted for vancomycin  dosing. Patient recently admitted for syncope from uncertain cause and discharged.  Patient found down at home and EMS called.  She is ESRD on HD.  She had in-hospital cardiac arrest on 07/02/2024  Today, 07/06/2024 Day 1 vancomycin /Day 4 Ceftriaxone  Renal: ESRD on HD MWF - has AVF (s/p ligation for steal syndrome in Nov) and HD catheter (catheter used for HD) Afebrile WBC 15.5 1/18 blood cx R AC at 2125 with GPC in both bottles - S. Gordonii and MRSE 1/18 blood cx R hand at 2016 with GPC - S. Aureus (susc pend) 1/20 blood cx: NGTD 1/21 peritoneal fluid: NGTD Fluid: 609 Nucleate cells (44% neuts for absolute PMN count = 268) Last ECHO done 06/23/24  Plan: Vancomycin  1500mg  IV x1 then 750mg  on MWH after HD Goal pre-HD vancomycin  level 15-25 mcg/mL Plan to check vancomycin  level prior to 3rd-4th HD  F/u plans for ECHO, possibly TEE Repeat blood cx are NGTD  Height: 5' 6 (167.6 cm) Weight: 75.8 kg (167 lb 1.7 oz) IBW/kg (Calculated) : 59.3  Temp (24hrs), Avg:97.5 F (36.4 C), Min:96.9 F (36.1 C), Max:98 F (36.7 C)  Recent Labs  Lab 07/01/24 1336 07/01/24 2155 07/02/24 1646 07/02/24 1647 07/02/24 2016 07/03/24 0543 07/04/24 0441 07/05/24 0439 07/06/24 0407  WBC 12.0*   < > 14.2*  --   --  14.7* 13.9* 13.7* 15.5*  CREATININE 5.07*   < > 3.57*  --   --  4.09* 3.18* 3.97* 2.83*  LATICACIDVEN 1.9  --   --  5.4* 3.7*  --   --   --   --    < > = values in this interval not displayed.    Estimated Creatinine Clearance: 21.7 mL/min (A) (by C-G formula based on SCr of 2.83 mg/dL (H)).    Allergies[1]  Antimicrobials this admission: 1/18 doxycycline  >> 1/19 1/19 ceftriaxone  >> 1/22 vancomycin  >>  Thank you for allowing pharmacy to be a part of this patients care.  Estell Dillinger, PharmD, BCPS, BCIDP Work Cell:  [phone] 07/06/2024 9:48 AM       [1]  Allergies Allergen Reactions   Morphine  And Codeine Hives   Levaquin [Levofloxacin] Itching and Other (See Comments)    Severe itching; prickly sensation; severe left leg pain; immobility.   Enalapril  Other (See Comments)    hallucinations   Latex Rash   Tape Rash and Other (See Comments)

## 2024-07-06 NOTE — Progress Notes (Signed)
 PHARMACY - ANTICOAGULATION CONSULT NOTE  Pharmacy Consult for UFH infusion Indication: atrial fibrillation  Allergies[1]  Patient Measurements: Height: 5' 6 (167.6 cm) Weight: 75.8 kg (167 lb 1.7 oz) IBW/kg (Calculated) : 59.3 HEPARIN  DW (KG): 70.5  Vital Signs: Temp: 97.9 F (36.6 C) (01/22 0400) Temp Source: Axillary (01/22 0400) BP: 105/55 (01/22 0745) Pulse Rate: 33 (01/22 0745)  Labs: Recent Labs    07/04/24 0441 07/04/24 1327 07/05/24 0020 07/05/24 0439 07/05/24 0800 07/05/24 0820 07/06/24 0407  HGB 12.1  --   --  11.2*  --   --  11.7*  HCT 35.8*  --   --  32.6*  --   --  33.7*  PLT 73*  --   --  68*  --   --  78*  APTT 53*   < > 78*  --  86*  --  61*  LABPROT  --   --   --   --   --  16.3*  --   INR  --   --   --   --   --  1.2  --   HEPARINUNFRC >1.10*  --   --   --  0.82*  --  0.53  CREATININE 3.18*  --   --  3.97*  --   --  2.83*   < > = values in this interval not displayed.    Estimated Creatinine Clearance: 21.7 mL/min (A) (by C-G formula based on SCr of 2.83 mg/dL (H)).   Medical History: Past Medical History:  Diagnosis Date   Acute lacunar infarction (HCC) 03/03/2014   a.) MRI brain 03/03/2014: two acute punctate lacunar infarcts anteriorly in the BILATERAL centrum semiovale   Adult behavior problems    Anemia of chronic renal failure    Aortic atherosclerosis    Arthritis    Atrial fibrillation and flutter (HCC)    a.) CHA2DS2VASc = 6 (sex, HFrEF, HTN, CVA x 2, prior MI/vascular disease) as of 02/04/2024; b.) rate/rhythm maintained on oral metoprolol  succinate; chronically anticoagulated with apixaban    Bipolar disorder (HCC)    Cardiomegaly    Cerebral microvascular disease    CHF (congestive heart failure) (HCC)    Chicken pox    Cholelithiasis    Chronic respiratory failure with hypoxia (HCC) 12/19/2021   COPD (chronic obstructive pulmonary disease) (HCC)    Coronary artery disease    DDD (degenerative disc disease), cervical     DDD (degenerative disc disease), thoracolumbar    Diverticulosis    Dyspnea    ESRD (end stage renal disease) on dialysis (M,W,F)    GERD (gastroesophageal reflux disease)    Gout    HCV (hepatitis C virus)    Headache    HFrEF (heart failure with reduced ejection fraction) (HCC)    History of delirium 04/12/2014   History of substance abuse (HCC)    a.) tobacco + cocaine   Hyperkalemia 11/13/2014   Hyperlipidemia    Hypertension    Hypotension 02/24/2014   Hypothyroidism    Ischemic cardiomyopathy    Nose colonized with MRSA 02/01/2024   a.) presurgical PCR (+) on 02/01/2024 prior to A/V FISTULAGRAM; b.) presurgical PCR (+) on 03/07/2024 prior to AV FISTULA CREATION   NSTEMI (non-ST elevated myocardial infarction) (HCC) 10/08/2013   NSTEMI (non-ST elevated myocardial infarction) (HCC) 01/23/2002   a.) LHC 01/24/2002 --> multi-vessel CAD with IRA being 90% OM2 --> delayed PCI until 01/25/2002 --> 3.0 x 23 mm BX Velocity stent   Obesity  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    On apixaban  therapy    Peripheral vascular disease    Pneumonia    Postoperative anemia due to acute blood loss 02/27/2014   Postoperative cerebrovascular infarction following cardiac surgery 02/21/2014   a.) developed RIGHT upper/lower extremity weakness following cardiac revascularization --> neuro consult and head CT --> Hypodensity noted in the area of the LEFT motor cortex consistent with a component of acute ischemia   Pulmonary hypertension (HCC)    RBBB (right bundle branch block)    Renal osteodystrophy    S/P CABG x 3 02/20/2014   a.) LIMA-LAD, SVG-OM1, SVG-PDA   Stroke (HCC) 05/23/2010   a.) brain MRI 05/23/2010: BILATERAL acute infarcts and old prior infarcts; RIGHT frontal lobe extending into the white matter. Small focus of restricted diffusion in the cortex of the medial LEFT frontoparietal lobe. Periventricular T2 and multiple foci of T2 hyperintensity in the subcortical white  matter and increased FLAIR signal in the cortex of the LEFT frontal lobe.   Syncope and collapse 07/25/2020    Medications:  Scheduled:   Chlorhexidine  Gluconate Cloth  6 each Topical Daily   collagenase   1 Application Topical Daily   feeding supplement (PROSource TF20)  60 mL Per Tube Daily   feeding supplement (VITAL AF 1.2 CAL)  1,000 mL Per Tube Q24H   free water   30 mL Per Tube Q4H   levothyroxine   75 mcg Oral Q0600   midodrine   10 mg Oral TID WC   multivitamin  1 tablet Per Tube QHS   nutrition supplement (JUVEN)  1 packet Per Tube BID BM   mouth rinse  15 mL Mouth Rinse Q2H   pantoprazole  (PROTONIX ) IV  40 mg Intravenous Daily   sodium chloride  flush  10-40 mL Intracatheter Q12H   venlafaxine   37.5 mg Oral BID WC    Assessment: 64 y/o F with a h/o atrial fibrillation on apixaban . Last dose was 07-17-24 at 0900. Patient coded on 07/17/2024 with subsequent ROSC and is now in the ICU on ventilation and pressor support. Pharmacy consulted for IV heparin .   Goal of Therapy:  anti-Xa level  0.3-0.7 units/ml aPTT 66-102 seconds Monitor platelets by anticoagulation protocol: Yes   Plan: aPTT subtherapeutic x 1 --continue heparin  infusion at 1450 units/hr --recheck aPTT in 8 hrs  --Transition to anti-Xa levels when correlating > heparin  level close to range --Daily anti-Xa/CBC  Adriana Bolster, PharmD, BCPS 07/06/2024 7:58 AM                 [1]  Allergies Allergen Reactions   Morphine  And Codeine Hives   Levaquin [Levofloxacin] Itching and Other (See Comments)    Severe itching; prickly sensation; severe left leg pain; immobility.   Enalapril  Other (See Comments)    hallucinations   Latex Rash   Tape Rash and Other (See Comments)

## 2024-07-06 NOTE — Progress Notes (Signed)
 EP brief progress note -   Patient's tele remains overall stable from yesterday. Continues to have occasional PVCs with brief, short NSVT. Sinus rates in 50-60s  No significant bradycardia after resuming amiodarone .  Lidocaine  off as of yesterday afternoon, remains off levo  Continue IV amiodarone  until able to tolerate PO   EP will continue to follow clinical course.      Linsay Vogt, NP Electrophysiology 07/06/24 10:55 AM

## 2024-07-06 NOTE — Plan of Care (Signed)
" °  Problem: Education: Goal: Knowledge of General Education information will improve Description: Including pain rating scale, medication(s)/side effects and non-pharmacologic comfort measures Outcome: Not Progressing   Problem: Health Behavior/Discharge Planning: Goal: Ability to manage health-related needs will improve Outcome: Not Progressing   Problem: Clinical Measurements: Goal: Ability to maintain clinical measurements within normal limits will improve Outcome: Progressing Goal: Will remain free from infection Outcome: Progressing Goal: Diagnostic test results will improve Outcome: Progressing Goal: Respiratory complications will improve Outcome: Progressing   Problem: Nutrition: Goal: Adequate nutrition will be maintained Outcome: Progressing   Problem: Coping: Goal: Level of anxiety will decrease Outcome: Progressing   Problem: Elimination: Goal: Will not experience complications related to bowel motility Outcome: Progressing   "

## 2024-07-06 NOTE — Progress Notes (Signed)
 PHARMACY - ANTICOAGULATION CONSULT NOTE  Pharmacy Consult for UFH infusion Indication: atrial fibrillation  Allergies[1]  Patient Measurements: Height: 5' 6 (167.6 cm) Weight: 75.1 kg (165 lb 9.1 oz) IBW/kg (Calculated) : 59.3 HEPARIN  DW (KG): 70.5  Vital Signs: Temp: 97.9 F (36.6 C) (01/22 0400) Temp Source: Axillary (01/22 0400) BP: 107/59 (01/22 0430) Pulse Rate: 64 (01/22 0430)  Labs: Recent Labs    07/03/24 0543 07/03/24 1525 07/04/24 0441 07/04/24 1327 07/05/24 0020 07/05/24 0439 07/05/24 0800 07/05/24 0820 07/06/24 0407  HGB 12.4  --  12.1  --   --  11.2*  --   --  11.7*  HCT 37.9  --  35.8*  --   --  32.6*  --   --  33.7*  PLT 63*  --  73*  --   --  68*  --   --  78*  APTT 102*   < > 53*   < > 78*  --  86*  --  61*  LABPROT  --   --   --   --   --   --   --  16.3*  --   INR  --   --   --   --   --   --   --  1.2  --   HEPARINUNFRC >1.10*  --  >1.10*  --   --   --  0.82*  --  0.53  CREATININE 4.09*  --  3.18*  --   --  3.97*  --   --   --    < > = values in this interval not displayed.    Estimated Creatinine Clearance: 15.4 mL/min (A) (by C-G formula based on SCr of 3.97 mg/dL (H)).   Medical History: Past Medical History:  Diagnosis Date   Acute lacunar infarction (HCC) 03/03/2014   a.) MRI brain 03/03/2014: two acute punctate lacunar infarcts anteriorly in the BILATERAL centrum semiovale   Adult behavior problems    Anemia of chronic renal failure    Aortic atherosclerosis    Arthritis    Atrial fibrillation and flutter (HCC)    a.) CHA2DS2VASc = 6 (sex, HFrEF, HTN, CVA x 2, prior MI/vascular disease) as of 02/04/2024; b.) rate/rhythm maintained on oral metoprolol  succinate; chronically anticoagulated with apixaban    Bipolar disorder (HCC)    Cardiomegaly    Cerebral microvascular disease    CHF (congestive heart failure) (HCC)    Chicken pox    Cholelithiasis    Chronic respiratory failure with hypoxia (HCC) 12/19/2021   COPD (chronic  obstructive pulmonary disease) (HCC)    Coronary artery disease    DDD (degenerative disc disease), cervical    DDD (degenerative disc disease), thoracolumbar    Diverticulosis    Dyspnea    ESRD (end stage renal disease) on dialysis (M,W,F)    GERD (gastroesophageal reflux disease)    Gout    HCV (hepatitis C virus)    Headache    HFrEF (heart failure with reduced ejection fraction) (HCC)    History of delirium 04/12/2014   History of substance abuse (HCC)    a.) tobacco + cocaine   Hyperkalemia 11/13/2014   Hyperlipidemia    Hypertension    Hypotension 02/24/2014   Hypothyroidism    Ischemic cardiomyopathy    Nose colonized with MRSA 02/01/2024   a.) presurgical PCR (+) on 02/01/2024 prior to A/V FISTULAGRAM; b.) presurgical PCR (+) on 03/07/2024 prior to AV FISTULA CREATION   NSTEMI (non-ST elevated  myocardial infarction) (HCC) 10/08/2013   NSTEMI (non-ST elevated myocardial infarction) (HCC) 01/23/2002   a.) LHC 01/24/2002 --> multi-vessel CAD with IRA being 90% OM2 --> delayed PCI until 01/25/2002 --> 3.0 x 23 mm BX Velocity stent   Obesity    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    On apixaban  therapy    Peripheral vascular disease    Pneumonia    Postoperative anemia due to acute blood loss 02/27/2014   Postoperative cerebrovascular infarction following cardiac surgery 02/21/2014   a.) developed RIGHT upper/lower extremity weakness following cardiac revascularization --> neuro consult and head CT --> Hypodensity noted in the area of the LEFT motor cortex consistent with a component of acute ischemia   Pulmonary hypertension (HCC)    RBBB (right bundle branch block)    Renal osteodystrophy    S/P CABG x 3 02/20/2014   a.) LIMA-LAD, SVG-OM1, SVG-PDA   Stroke (HCC) 05/23/2010   a.) brain MRI 05/23/2010: BILATERAL acute infarcts and old prior infarcts; RIGHT frontal lobe extending into the white matter. Small focus of restricted diffusion in the cortex of the  medial LEFT frontoparietal lobe. Periventricular T2 and multiple foci of T2 hyperintensity in the subcortical white matter and increased FLAIR signal in the cortex of the LEFT frontal lobe.   Syncope and collapse 07/25/2020    Medications:  Scheduled:   Chlorhexidine  Gluconate Cloth  6 each Topical Daily   collagenase   1 Application Topical Daily   feeding supplement (PROSource TF20)  60 mL Per Tube Daily   feeding supplement (VITAL AF 1.2 CAL)  1,000 mL Per Tube Q24H   free water   30 mL Per Tube Q4H   levothyroxine   75 mcg Oral Q0600   midodrine   10 mg Oral TID WC   multivitamin  1 tablet Per Tube QHS   nutrition supplement (JUVEN)  1 packet Per Tube BID BM   mouth rinse  15 mL Mouth Rinse Q2H   pantoprazole  (PROTONIX ) IV  40 mg Intravenous Daily   sodium chloride  flush  10-40 mL Intracatheter Q12H   venlafaxine   37.5 mg Oral BID WC    Assessment: 64 y/o F with a h/o atrial fibrillation on apixaban . Last dose was 1/18 at 0900. Patient coded on 2024-08-01 with subsequent ROSC and is now in the ICU on ventilation and pressor support. Pharmacy consulted for IV heparin .   Goal of Therapy:  Heparin  level 0.3-0.7 units/ml aPTT 66-102 seconds Monitor platelets by anticoagulation protocol: Yes   Plan: aPTT subtherapeutic / HL 0.53 --Bolus 1100 units x 1 --Increase heparin  infusion to 1450 units/hr --recheck APTT in 8 hrs after rate change --Transition to anti-Xa levels when correlating > heparin  level close to range --Daily anti-Xa/CBC  Belvie Macintosh, PharmD Candidate 07/06/2024 5:15 AM                [1]  Allergies Allergen Reactions   Morphine  And Codeine Hives   Levaquin [Levofloxacin] Itching and Other (See Comments)    Severe itching; prickly sensation; severe left leg pain; immobility.   Enalapril  Other (See Comments)    hallucinations   Latex Rash   Tape Rash and Other (See Comments)

## 2024-07-06 NOTE — Progress Notes (Signed)
 Pharmacy - Blood culture update  Microbiology called to update the 07/02/24 blood culture set from R hand (at 20:16) back as S. Aureus (should not susceptibilities tomorrow).   The set from [facility] at 2125 resulted 1/19 with GPC, BCID streptococcus species (back as S. Gordonii) and MRSE  ID Is following patient  Plan: Start vancomycin    Chrles Selley, PharmD, BCPS, [place] Work Cell: [phone] 07/06/2024 9:25 AM

## 2024-07-06 NOTE — Progress Notes (Signed)
 "  Date of Admission:  07/01/2024    ID: [name] is a 64 y.o. female  Principal Problem:   Syncope and collapse Active Problems:   ESRD on hemodialysis (HCC)   Chronic hypotension   S/P CABG x 3   Mild dementia associated with other underlying disease, without behavioral disturbance, psychotic disturbance, mood disturbance, or anxiety (HCC)   Anemia of chronic disease   Paroxysmal atrial fibrillation (HCC)   Hypothyroidism   Chronic combined systolic and diastolic CHF (congestive heart failure) (HCC)   Hepatitis C antibody positive in blood   Other cirrhosis of liver (HCC)   Hypertension   Bipolar disorder in full remission, most recent episode unspecified type   Chronic pain syndrome   Other secondary kyphosis, cervical region   Cervical spondylosis   Fall   Acute pulmonary edema (HCC)   Bacteremia due to Streptococcus    Subjective: Remains intubated  Medications:   Chlorhexidine  Gluconate Cloth  6 each Topical Daily   collagenase   1 Application Topical Daily   feeding supplement (PROSource TF20)  60 mL Per Tube Daily   feeding supplement (VITAL AF 1.2 CAL)  1,000 mL Per Tube Q24H   free water   30 mL Per Tube Q4H   levothyroxine   75 mcg Oral Q0600   midodrine   10 mg Oral TID WC   multivitamin  1 tablet Per Tube QHS   nutrition supplement (JUVEN)  1 packet Per Tube BID BM   mouth rinse  15 mL Mouth Rinse Q2H   pantoprazole  (PROTONIX ) IV  40 mg Intravenous Daily   sodium chloride  flush  10-40 mL Intracatheter Q12H   venlafaxine   37.5 mg Oral BID WC    Objective: Vital signs in last 24 hours: Patient Vitals for the past 24 hrs:  BP Temp Temp src Pulse Resp SpO2 Weight  07/06/24 1300 122/71 -- -- (!) 38 (!) 31 100 % --  07/06/24 1245 122/73 -- -- (!) 35 (!) 29 99 % --  07/06/24 1230 125/74 -- -- (!) 41 (!) 31 100 % --  07/06/24 1215 126/70 -- -- (!) 33 (!) 30 98 % --  07/06/24 1200 125/73 -- -- -- (!) 33 -- --  07/06/24 1145 119/72 -- -- (!) 40 (!) 31 98  % --  07/06/24 1130 119/69 -- -- (!) 58 (!) 30 95 % --  07/06/24 1115 (!) 119/56 -- -- (!) 55 (!) 28 97 % --  07/06/24 1100 112/74 98.6 F (37 C) -- (!) 42 18 97 % --  07/06/24 1045 123/69 -- -- (!) 50 (!) 21 100 % --  07/06/24 1030 121/68 -- -- (!) 50 (!) 22 100 % --  07/06/24 1015 114/71 -- -- (!) 44 (!) 21 100 % --  07/06/24 1000 115/69 -- -- (!) 43 (!) 23 100 % --  07/06/24 0945 110/70 -- -- 60 (!) 22 98 % --  07/06/24 0930 104/60 -- -- (!) 56 20 100 % --  07/06/24 0915 (!) 103/56 -- -- (!) 52 18 97 % --  07/06/24 0900 115/70 -- -- (!) 50 (!) 22 99 % --  07/06/24 0845 119/65 -- -- (!) 53 (!) 22 99 % --  07/06/24 0830 118/69 -- -- (!) 48 19 98 % --  07/06/24 0815 118/73 -- -- (!) 57 19 98 % --  07/06/24 0800 108/66 -- -- (!) 32 16 100 % --  07/06/24 0745 (!) 105/55 -- -- (!) 33 18 100 % --  07/06/24 0730 107/63 98.2 F (36.8 C) -- (!) 35 18 99 % --  07/06/24 0715 118/64 -- -- (!) 57 16 100 % --  07/06/24 0700 107/65 -- -- (!) 34 18 100 % --  07/06/24 0645 (!) 105/59 -- -- (!) 34 17 98 % --  07/06/24 0630 99/60 -- -- (!) 46 18 99 % --  07/06/24 0615 (!) 93/58 -- -- (!) 54 20 99 % --  07/06/24 0600 (!) 94/58 -- -- (!) 52 18 100 % --  07/06/24 0545 (!) 88/56 -- -- (!) 50 19 99 % --  07/06/24 0530 113/65 -- -- 65 18 92 % --  07/06/24 0515 109/64 -- -- 63 19 96 % --  07/06/24 0500 99/65 -- -- (!) 54 17 98 % 75.8 kg  07/06/24 0445 (!) 89/59 -- -- (!) 54 19 98 % --  07/06/24 0430 (!) 107/59 -- -- 64 18 100 % --  07/06/24 0415 (!) 114/56 -- -- -- 17 -- --  07/06/24 0400 118/77 97.9 F (36.6 C) Axillary 66 19 95 % --  07/06/24 0345 (!) 100/59 -- -- (!) 50 18 98 % --  07/06/24 0330 (!) 97/59 -- -- (!) 54 19 98 % --  07/06/24 0315 101/60 -- -- (!) 54 17 98 % --  07/06/24 0300 105/63 -- -- (!) 52 20 98 % --  07/06/24 0245 (!) 108/51 -- -- 61 18 97 % --  07/06/24 0230 (!) 97/58 -- -- (!) 56 20 98 % --  07/06/24 0215 105/68 -- -- 65 18 98 % --  07/06/24 0200 102/64 -- -- 69 19 98 % --   07/06/24 0145 100/60 -- -- 62 19 98 % --  07/06/24 0130 103/70 -- -- 60 19 98 % --  07/06/24 0115 100/68 -- -- 61 (!) 21 98 % --  07/06/24 0100 (!) 99/58 -- -- 63 19 98 % --  07/06/24 0045 (!) 98/59 -- -- (!) 57 20 99 % --  07/06/24 0030 (!) 96/50 -- -- 61 19 98 % --  07/06/24 0015 (!) 101/59 -- -- (!) 54 16 98 % --  07/06/24 0000 102/66 98 F (36.7 C) Axillary 68 (!) 21 99 % --  07/05/24 2345 97/64 -- -- 62 (!) 21 99 % --  07/05/24 2330 (!) 93/50 -- -- (!) 52 17 99 % --  07/05/24 2315 (!) 90/59 -- -- 65 19 99 % --  07/05/24 2300 109/62 -- -- (!) 56 18 98 % --  07/05/24 2245 (!) 101/58 -- -- 63 19 99 % --  07/05/24 2230 102/64 -- -- (!) 50 17 100 % --  07/05/24 2215 108/65 -- -- (!) 57 17 99 % --  07/05/24 2200 111/66 -- -- (!) 57 18 99 % --  07/05/24 2146 115/66 -- -- 63 17 100 % --  07/05/24 2145 115/66 -- -- 66 18 100 % --  07/05/24 2130 114/70 -- -- 68 19 100 % --  07/05/24 2115 121/67 -- -- -- 19 -- --  07/05/24 2100 118/72 -- -- -- 19 -- --  07/05/24 2045 120/71 -- -- 68 19 93 % --  07/05/24 2036 -- -- -- 71 18 93 % --  07/05/24 2030 126/72 -- -- 63 19 94 % --  07/05/24 2015 (!) 128/54 -- -- 68 19 94 % --  07/05/24 2000 132/69 -- -- 69 19 96 % --  07/05/24 1945 128/69 (!) 97.1 F (36.2 C) Axillary  77 18 95 % --  07/05/24 1915 (!) 127/56 -- -- -- 18 -- --  07/05/24 1900 128/67 -- -- -- 19 -- --  07/05/24 1745 119/61 -- -- -- 20 -- --  07/05/24 1730 114/66 -- -- -- 17 -- --  07/05/24 1715 117/65 -- -- -- 17 -- --  07/05/24 1700 117/63 -- -- -- 17 -- --  07/05/24 1645 109/61 -- -- (!) 54 16 98 % --  07/05/24 1630 (!) 103/53 -- -- (!) 55 16 97 % --  07/05/24 1615 (!) 109/53 -- -- (!) 56 16 98 % --  07/05/24 1600 (!) 84/53 -- -- (!) 52 16 97 % --  07/05/24 1545 (!) 105/58 -- -- (!) 57 16 98 % --  07/05/24 1530 115/63 -- -- 60 15 99 % --  07/05/24 1515 (!) 109/57 -- -- (!) 58 16 100 % --  07/05/24 1500 99/60 -- -- (!) 59 16 100 % --  07/05/24 1445 (!) 106/59 -- -- (!) 59  16 99 % --     Lines and Device Date on insertion # of days DC  Chiropractor     Foley     ETT       PHYSICAL EXAM:  General: Intubated Lungs: b/la ir enry Heart: s1s2 Abdomen: Soft, non-tender,not distended. Bowel sounds normal. No masses Extremities: atraumatic, no cyanosis. No edema. No clubbing Skin: No rashes or lesions. Or bruising Lymph: Cervical, supraclavicular normal. Neurologic: cannot assess  Lab Results    Latest Ref Rng & Units 07/06/2024    4:07 AM 07/05/2024    4:39 AM 07/04/2024    4:41 AM  CBC  WBC 4.0 - 10.5 K/uL 15.5  13.7  13.9   Hemoglobin 12.0 - 15.0 g/dL 88.2  88.7  87.8   Hematocrit 36.0 - 46.0 % 33.7  32.6  35.8   Platelets 150 - 400 K/uL 78  68  73        Latest Ref Rng & Units 07/06/2024    4:07 AM 07/05/2024    4:39 AM 07/04/2024    7:27 AM  CMP  Glucose 70 - 99 mg/dL 96  79    BUN 8 - 23 mg/dL 29  36    Creatinine 9.55 - 1.00 mg/dL 7.16  6.02    Sodium 864 - 145 mmol/L 130  131    Potassium 3.5 - 5.1 mmol/L 4.0  3.7  3.9   Chloride 98 - 111 mmol/L 90  91    CO2 22 - 32 mmol/L 26  25    Calcium  8.9 - 10.3 mg/dL 89.9  89.7        Microbiology:     Assessment/Plan: 64 y.o. female with a history of ESRD, CAD s/p CABG, Afib ICM, h/o pericardial effusion h/o PEA arrest , steal syndrome rt arm with ligation of rt brachial artery to axillary vein graft on 04/20/24 recently in the hospital between 06/22/24 until 06/28/24 for syncope, fluid over load, chronic hypotension, cardiogenic shock EF 25-30%,  presenting with fall/ weakness and found on the floor   Syncope Related to her cardiac state    Severe ischemic cardiomyopathy with biventricular failure    ESRD on dialysis thru a catheter   Ventricular fibrillation and cardiac arrest on 07/02/24- resucitated - now intubated  on pressor and amiodarone    Strep gordonii bacteremia- MRSA bacteremia   blood culture done after resuscitation though she had fever on the day of  admission  but no blood  culture sent on admission HD cath will have to be removed Vancomycin  started Continue ceftriaxone  until peritoneal fluid culture is back Repeat blood cultrue has been sent 2 d echo      C5-C6 vertebral collaspe of endplates- degeneration VS burnt out infection     Ascites s/p paracentesis- possible cirrhosis could be cardiac cirrhosis Cell coulnt 605 ( 44% N) Protein not sent Culture pending   Chronic thrombocytopenia  likely due to cirrhosis Anemia      HEPC antibody positive but RNA negative -= so no active infection   Stasis dermatitis with superficial ulcers- question of 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 Discussed the management with the intensivist    "

## 2024-07-06 NOTE — Progress Notes (Addendum)
 "                                                                                                                                                                                               Palliative Care Progress Note, Assessment & Plan   Patient Name: [name]       Date: 07/06/2024 DOB: 1960-08-04  Age: 64 y.o. MRN#: [id number] Attending Physician: Parris Manna, MD Primary Care Physician: Orlean Alan HERO, FNP Admit Date: 07/01/2024  Subjective: Patient is lying in bed, intubated but off sedation since 9 AM this morning.  Her brother [name] and her daughter [name] are present at bedside during my visit.  HPI: 64 y.o. female  with past medical history of ESRD on HD (M,W,F), COPD, HFrEF (25-30%), bipolar disorder, chronic hypotension on midodrine , HLD, recent hospitalization for fluid overload with cardiogenic shock requiring Levophed  and midodrine  (06/22/2024) and previous CVA admitted on 07/01/2024 with chief complaint of syncopal episode after being being found down by her brother at home.    Per chart review, patient was scheduled for hemodialysis on Friday PTA but could not completed due to unknown reasons.   Upon presentation to the ED, patient was sleepy but arousable, able to make conversation and her wishes known.   Radiograph of chest revealed possible interstitial prominences while radiograph of the pelvis and wrist were negative for fractures.   EKG revealed normal sinus rhythm with RBBB.   CT of brain and cervical spine were without fractures or bleeding.   Afternoon of 1/18, patient underwent dialysis.  Subsequently, unit secretary was notified via telemetry monitoring that patient was in V-fib.  RN found patient unresponsive and CODE BLUE was activated.  Patient was in V-fib arrest.  Patient received 2 rounds of epinephrine , calcium , bicarb, and shock with return of spontaneous circulation.  EDP intubated paient. Patient also given 300 IV amiodarone .  Cardiology was  consulted.  Patient was transferred to ICU.   Significant events in ICU are as follows:   07/03/24- overnight, unresposive with AF. MWF(HD). Will meet with family. Will continue to monitor and re-evaluate post dialysis.  07/04/24- patient had few episodes of VT. She is DNR code status.  She was seen by cardiology, she has poor prognosis. No procedures are planned.  07/05/24- patient had hypotensive episode during HD so it was interuppted.  The lidocaine  infusion will be dcd today and amiodarone  will continue.  Paracentesis today yielded 3.5liters ascitic fluid 1/22 - plan for turning off sedation and WUA  PMT was consulted to support patient with family with goals of care discussions.   Of note,  patient is familiar to our service as my colleagues met with patient during her most recent hospitalization-06/22/2024.  Following up today for continued discussion on boundaries of medical care and prognosis.  Summary of counseling/coordination of care: Chart review completed prior to meeting patient including:  -Labs: Sodium 130, creatinine 2.83, WBCs 15.5 -Vital signs: BP stable but heart rate bradycardic at high 30s-40s -Progress notes: As per cardiology note, lidocaine  was discontinued and Amio was restarted, 1 episode of NSVT this morning but otherwise improved ectopy -Orders: In for turning off sedation and wake up assessment today -Available advanced directive documents from current and previous encounters: Note from previous BMT provider on 1/10 patient was accepting of all offered, available, and interventions to sustain  After reviewing the patient's chart and assessing the patient at bedside, I spoke with patient's daughter and brother in regards to symptom management and goals of care.   Therapeutic silence and active listening provided for patient's family to share their thoughts and emotions regarding current medical situation.  Emotional support provided.  Discussed patient has stable  with no acute significant events.  Reviewed that sedation has been turned off and wake up assessment will be performed when patient hopefully is fully awake/alert.  However, cautioned family that given patient's ESRD it may take longer for sedation to wear off.  Therefore, watchful waiting will continue for today.  Discussed patient likely confusion and encouraged him to speak to her for comfort and a reassuring/familiar voice.  Attempted to discuss next steps with patient's family at bedside.  However, they share they have other questions answered and have no needs at this time.  Discussed that CCM can also provide updates but that PMT remains available to support patient and family throughout her hospitalization.  They are appreciative of my visit they have no acute palliative needs or concerns at this time.  No change to plan of care.  Awaiting WUA.  Physical Exam Vitals reviewed.  Constitutional:      General: She is not in acute distress.    Appearance: She is ill-appearing.  HENT:     Head: Normocephalic.  Cardiovascular:     Rate and Rhythm: Bradycardia present.  Pulmonary:     Comments: Ventilatory support Abdominal:     Palpations: Abdomen is soft.  Skin:    General: Skin is warm and dry.              Recommendations:   No change to plan of care Ongoing support for family to continue from PMT Awaiting WUA to determine next steps for patient  I personally spent a total of 35 minutes in the care of the patient today including preparing to see the patient, getting/reviewing separately obtained history, performing a medically appropriate exam/evaluation, counseling and educating, and documenting clinical information in the EHR.   Lamarr L. Arvid, DNP, FNP-BC Palliative Medicine Team   "

## 2024-07-06 NOTE — Plan of Care (Signed)
  Problem: Education: Goal: Knowledge of General Education information will improve Description: Including pain rating scale, medication(s)/side effects and non-pharmacologic comfort measures Outcome: Progressing   Problem: Health Behavior/Discharge Planning: Goal: Ability to manage health-related needs will improve Outcome: Progressing   Problem: Clinical Measurements: Goal: Ability to maintain clinical measurements within normal limits will improve Outcome: Progressing Goal: Will remain free from infection Outcome: Progressing Goal: Diagnostic test results will improve Outcome: Progressing Goal: Respiratory complications will improve Outcome: Progressing Goal: Cardiovascular complication will be avoided Outcome: Progressing   Problem: Activity: Goal: Risk for activity intolerance will decrease Outcome: Progressing   Problem: Nutrition: Goal: Adequate nutrition will be maintained Outcome: Progressing   Problem: Elimination: Goal: Will not experience complications related to bowel motility Outcome: Progressing   Problem: Skin Integrity: Goal: Risk for impaired skin integrity will decrease Outcome: Progressing   

## 2024-07-06 NOTE — Progress Notes (Signed)
 "  NAME:  [name], MRN:  [id number], DOB:  [birth year], LOS: 4 ADMISSION DATE:  07/01/2024, CONSULTATION DATE:  07/02/24 REFERRING MD:  Dr. Dorothyann, CHIEF COMPLAINT:  syncope   History of Present Illness:  [name] is a 64 y.o. female with a pmh of of ESRD on HD (MWF), COPD, CHF, bipolar disorder, chronic hypotension on midodrine , HLD and prior CVA who presented to [facility] ED via EMS following syncopal episode.   History was gathered per chart review Per patient, her normal dialysis schedule is MWF.  Patient was recently admitted to the intensive care unit earlier this month July 03, 2024.  At that time she was treated for acute on chronic hypoxemia with circulatory shock since then she has been discharged home and while at home was found down by her family and was brought into the hospital.  After receiving medical management on the mat on the floor and dialysis patient had episode of pulselessness with wide complex tachyarrhythmia.  Rapid response was initiated and ACLS was started on patient.  She received 2 rounds of epinephrine  as well as shock with return of spontaneous circulation.  1 dose of IV amiodarone  was delivered and cardiology was contacted with initiation of amiodarone  infusion.  Patient was brought into the ICU on ventilator and intubated by EDP during CODE BLUE.  07/03/24- overnight, unresposive with AF. MWF(HD). Will meet with family. Will continue to monitor and re-evaluate post dialysis.  07/04/24- patient had few episodes of VT. She is DNR code status.  She was seen by cardiology, she has poor prognosis. No procedures are planned.  07/05/24- patient had hypotensive episode during HD so it was interuppted.  The lidocaine  infusion will be dcd today and amiodarone  will continue.  Plan for paracentesis today. 07/06/24- patient with no events overnight, did have a non sustained run of VT similar to previous.  Performing awakening trial today.  Due to staph/strep bacteremia  the tunnelled vascCath has to come out.    Pertinent Medical History  Hypotension & Hypertension HLD HFrEF ESRD on HD Stroke CAD s/p CABG x 3 RBBB Pulmonary hypertension PVD Ischemic cardiomyopathy Hyperkalemia Hypothyroidism Atrial fibrillation on eliquis  Anemia of chronic kidney disease   Significant Hospital Events: Including procedures, antibiotic start and stop dates in addition to other pertinent events   07/02/24-transferred to ICU post CODE BLUE on medical floor status post ACLS with ROSC    Objective    Blood pressure (!) 105/55, pulse (!) 33, temperature 97.9 F (36.6 C), temperature source Axillary, resp. rate 18, height 5' 6 (1.676 m), weight 75.8 kg, SpO2 100%.    Vent Mode: PRVC FiO2 (%):  [28 %] 28 % Set Rate:  [16 bmp] 16 bmp Vt Set:  [450 mL] 450 mL PEEP:  [5 cmH20] 5 cmH20 Plateau Pressure:  [18 cmH20-19 cmH20] 18 cmH20   Intake/Output Summary (Last 24 hours) at 07/06/2024 0850 Last data filed at 07/06/2024 9386 Gross per 24 hour  Intake 1527.77 ml  Output 0 ml  Net 1527.77 ml   Filed Weights   07/05/24 0700 07/05/24 1115 07/06/24 0500  Weight: 75.1 kg 75.1 kg 75.8 kg    Examination: General: Adult female, intubated unresponseve HENT: atraumatic, normocephalic, neck supple  Cardiovascular: ventricular rhythm, S1 S2, no r/m/g Pulm: Regular, non labored on [place] , breath sounds equal throughout GI: soft, non-tender, non-distended, no rebound/guarding, bs x 4 Extremities: warm/dry, pulses + 2 R/P, no edema noted Neuro: GCS4T GU: deferred   Assessment and Plan  Septic shock  present on admission - strep/stap bacteremia    Needs to have vasc cath removed . ID on case.  Poor prognosis recommend discussing regarding goals of care prior to vascular surgery. With multi organ failure her prognosis is poor. Palliative care consultation placed.   # Cardiac arrest Hx: BiVfailure, CAD s/p CABG, Ischemic Cardiomyopathy, CVA       Wide-complex  tachyarrhythmia status post ACLS with shock and return of spontaneous circulation.  Patient currently on mechanical ventilation status post endotracheal intubation.  Cardiology consultation placed-appreciate input Dr. Colin  infusion continued, lidocaine  drip dcd        -hx of AF with biventricular failure        -reviewed with cardiologist        - RBBB on EKG         - LR x 1 , digoxin  x 1        - s/p fem CVL on right -on amio off lidocaine  gtt - Continuous cardiac monitoring - EKG prn - ECHO ordered #COPD - xopenex  #HFrEF #Atrial Fibrillation on Eliquis  #ESRD on HD (MWF)- s/p HD #GERD #Nausea and Vomiting Hx: GERD, HCV, Diverticulosis - Monitor hepatic function as indication - Antiemetics prn - Constipation protocol prn - Diet: NPO except with meds #Chronic Thrombocytopenia #VTE prophylaxis - Trend CBC  - Transfuse if Hgb < 7 or platelets <10 - Monitor for s/sx of bleeding - Continue eliquis  5mg  BID  #Chronic Pain s/t Vascular Ulcers - percocet 5-325 mg x 1 - supportive care   Labs   CBC: Recent Labs  Lab 07/01/24 1336 07/02/24 0526 07/02/24 1646 07/03/24 0543 07/04/24 0441 07/05/24 0439 07/06/24 0407  WBC 12.0*   < > 14.2* 14.7* 13.9* 13.7* 15.5*  NEUTROABS 10.4*  --   --   --   --   --   --   HGB 12.7   < > 12.2 12.4 12.1 11.2* 11.7*  HCT 38.7   < > 37.3 37.9 35.8* 32.6* 33.7*  MCV 97.0   < > 96.4 96.2 93.0 89.8 89.4  PLT 82*   < > 70* 63* 73* 68* 78*   < > = values in this interval not displayed.    Basic Metabolic Panel: Recent Labs  Lab 07/02/24 1646 07/03/24 0543 07/03/24 1847 07/04/24 0441 07/04/24 0727 07/05/24 0439 07/06/24 0407  NA 136 134*  --  134*  --  131* 130*  K 3.9 4.5 3.8 3.9 3.9 3.7 4.0  CL 90* 90*  --  92*  --  91* 90*  CO2 23 23  --  25  --  25 26  GLUCOSE 154* 169*  --  115*  --  79 96  BUN 18 26*  --  21  --  36* 29*  CREATININE 3.57* 4.09*  --  3.18*  --  3.97* 2.83*  CALCIUM  10.3 10.2  --  9.8   --  10.2 10.0  MG  --   --  1.8 1.9 1.9 2.4 2.1  PHOS  --   --   --  2.7  --  2.8 2.8   GFR: Estimated Creatinine Clearance: 21.7 mL/min (A) (by C-G formula based on SCr of 2.83 mg/dL (H)). Recent Labs  Lab 07/01/24 1336 07/02/24 0526 07/02/24 1647 07/02/24 2016 07/03/24 0543 07/04/24 0441 07/05/24 0439 07/06/24 0407  WBC 12.0*   < >  --   --  14.7* 13.9* 13.7* 15.5*  LATICACIDVEN 1.9  --  5.4* 3.7*  --   --   --   --    < > =  values in this interval not displayed.    Liver Function Tests: Recent Labs  Lab 07/01/24 1336 07/01/24 2155 07/02/24 0526 07/02/24 1646 07/04/24 0441 07/05/24 0439 07/06/24 0407  AST 36 36 37 54*  --   --   --   ALT 15 15 14 19   --   --   --   ALKPHOS 71 68 72 81  --   --   --   BILITOT 1.2 1.2 1.3* 1.4*  --   --   --   PROT 7.2 6.8 6.9 6.6  --   --   --   ALBUMIN  3.8 3.5 3.5 3.5 3.1* 2.9* 3.0*   No results for input(s): LIPASE, AMYLASE in the last 168 hours. Recent Labs  Lab 07/01/24 1726  AMMONIA 15    ABG    Component Value Date/Time   PHART 7.5 (H) 07/02/2024 2058   PCO2ART 33 07/02/2024 2058   PO2ART 202 (H) 07/02/2024 2058   HCO3 25.7 07/02/2024 2058   TCO2 26 04/20/2024 0708   ACIDBASEDEF 0.2 07/01/2024 2155   O2SAT 99.2 07/02/2024 2058     Coagulation Profile: Recent Labs  Lab 07/05/24 0820  INR 1.2    Cardiac Enzymes: No results for input(s): CKTOTAL, CKMB, CKMBINDEX, TROPONINI in the last 168 hours.  HbA1C: No results found for: HGBA1C  CBG: Recent Labs  Lab 07/05/24 1616 07/05/24 1923 07/06/24 0008 07/06/24 0346 07/06/24 0733  GLUCAP 92 169* 111* 104* 111*    Review of Systems:    10 point review of systems unable to conduct due to acutely comatose state on mechanical ventilation  Past Medical History:  She,  has a past medical history of Acute lacunar infarction [facility]) (03/03/2014), Adult behavior problems, Anemia of chronic renal failure, Aortic atherosclerosis, Arthritis, Atrial  fibrillation and flutter (HCC), Bipolar disorder (HCC), Cardiomegaly, Cerebral microvascular disease, CHF (congestive heart failure) (HCC), Chicken pox, Cholelithiasis, Chronic respiratory failure with hypoxia (HCC) (12/19/2021), COPD (chronic obstructive pulmonary disease) (HCC), Coronary artery disease, DDD (degenerative disc disease), cervical, DDD (degenerative disc disease), thoracolumbar, Diverticulosis, Dyspnea, ESRD (end stage renal disease) on dialysis (M,W,F), GERD (gastroesophageal reflux disease), Gout, HCV (hepatitis C virus), Headache, HFrEF (heart failure with reduced ejection fraction) (HCC), History of delirium (04/12/2014), History of substance abuse (HCC), Hyperkalemia (11/13/2014), Hyperlipidemia, Hypertension, Hypotension (02/24/2014), Hypothyroidism, Ischemic cardiomyopathy, Nose colonized with MRSA (02/01/2024), NSTEMI (non-ST elevated myocardial infarction) (HCC) (10/08/2013), NSTEMI (non-ST elevated myocardial infarction) (HCC) (01/23/2002), Obesity, 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, On apixaban  therapy, Peripheral vascular disease, Pneumonia, Postoperative anemia due to acute blood loss (02/27/2014), Postoperative cerebrovascular infarction following cardiac surgery (02/21/2014), Pulmonary hypertension (HCC), RBBB (right bundle branch block), Renal osteodystrophy, S/P CABG x 3 (02/20/2014), Stroke (HCC) (05/23/2010), and Syncope and collapse (07/25/2020).   Surgical History:   Past Surgical History:  Procedure Laterality Date   A/V FISTULAGRAM Left 03/30/2019   Procedure: A/V FISTULAGRAM;  Surgeon: Marea Selinda RAMAN, MD;  Location: [facility] INVASIVE CV LAB;  Service: Cardiovascular;  Laterality: Left;   A/V FISTULAGRAM Left 10/06/2019   Procedure: A/V FISTULAGRAM;  Surgeon: Marea Selinda RAMAN, MD;  Location: [facility] INV[facility] CV [facility];  Service: Cardiovascular;  Laterality: Left;   A/V FISTULAGRAM Left 05/19/2021   Procedure: A/V FISTULAGRAM;  Surgeon: Marea Selinda RAMAN, MD;  Location: [facility]  INVASIVE CV LAB;  Service: Cardiovascular;  Laterality: Left;   A/V FISTULAGRAM Left 01/29/2022   Procedure: A/V Fistulagram;  Surgeon: Marea Selinda RAMAN, MD;  Location: [facility] CV [facility];  Service: Cardiovascular;  Laterality: Left;   A/V FISTULAGRAM Left 04/30/2022   Procedure: A/V Fistulagram;  Surgeon: Marea Selinda RAMAN, MD;  Location: [facility] IN[facility] CV [facility];  Service: Cardiovascular;  Laterality: Left;   A/V FISTULAGRAM Left 08/20/2022   Procedure: A/V Fistulagram;  Surgeon: Marea Selinda RAMAN, MD;  Location: [facility];  Service: Cardiovascular;  Laterality: Left;   A/V FISTULAGRAM N/A 12/03/2022   Procedure: A/V Fistulagram;  Surgeon: Marea Selinda RAMAN, MD;  Location: [facility];  Service: Cardiovascular;  Laterality: N/A;   A/V FISTULAGRAM Left 02/11/2023   Procedure: A/V Fistulagram;  Surgeon: Marea Selinda RAMAN, MD;  Location: [facility];  Service: Cardiovascular;  Laterality: Left;   A/V FISTULAGRAM Left 02/07/2024   Procedure: A/V Fistulagram;  Surgeon: Marea Selinda RAMAN, MD;  Location: [facility];  Service: Cardiovascular;  Laterality: Left;   COLONOSCOPY     COLONOSCOPY WITH PROPOFOL  N/A 04/22/2021   Procedure: COLONOSCOPY WITH PROPOFOL ;  Surgeon: Maryruth Ole DASEN, MD;  Location: [facility] ENDOSCOPY;  Service: Endoscopy;  Laterality: N/A;   CORONARY ANGIOPLASTY WITH STENT PLACEMENT Left 01/25/2002   Procedure: CORONARY ANGIOPLASTY WITH STENT PLACEMENT; Location: [facility]; Surgeon: Margie Lovelace, MD   CORONARY ARTERY BYPASS GRAFT N/A 02/20/2014   Procedure: CORONARY ARTERY BYPASS GRAFT; Location: [facility]; Surgeon: Reyes Fruits, MD   DIALYSIS FISTULA CREATION     DIALYSIS/PERMA CATHETER INSERTION N/A 04/08/2023   Procedure: DIALYSIS/PERMA CATHETER INSERTION;  Surgeon: Marea Selinda RAMAN, MD;  Location: [facility];  Service: Cardiovascular;  Laterality: N/A;   DIALYSIS/PERMA CATHETER REMOVAL N/A 07/06/2023   Procedure: DIALYSIS/PERMA CATHETER REMOVAL;  Surgeon: [name] Cordella MATSU, MD;  Location: [facility];  Service: Cardiovascular;  Laterality: N/A;   ESOPHAGOGASTRODUODENOSCOPY     ESOPHAGOGASTRODUODENOSCOPY N/A 04/22/2021   Procedure: ESOPHAGOGASTRODUODENOSCOPY (EGD);  Surgeon: Maryruth Ole DASEN, MD;  Location: [facility] ENDOSCOPY;  Service: Endoscopy;  Laterality: N/A;   FLEXIBLE SIGMOIDOSCOPY N/A 02/23/2019   Procedure: FLEXIBLE SIGMOIDOSCOPY;  Surgeon: Gaylyn Gladis PENNER, MD;  Location: [facility] ENDOSCOPY;  Service: Endoscopy;  Laterality: N/A;   IABP INSERTION  02/20/2014   Procedure: INTRA-AORTIC BALLOON PUMP INSERTION; Location: [facility]; Surgeon: Reyes Fruits, MD   INSERTION OF ARTERIOVENOUS (AV) ARTEGRAFT ARM Right 03/09/2024   Procedure: INSERTION, GRAFT, ARTERIOVENOUS, UPPER EXTREMITY;  Surgeon: Marea Selinda RAMAN, MD;  Location: [facility] ORS;  Service: Vascular;  Laterality: Right;  BRACHIAL AXILLARY   INSERTION OF DIALYSIS CATHETER N/A 04/20/2024   Procedure: INSERTION OF DIALYSIS CATHETER;  Surgeon: Marea Selinda RAMAN, MD;  Location: [facility] ORS;  Service: Vascular;  Laterality: N/A;   LEFT HEART CATH AND CORONARY ANGIOGRAPHY Left 01/24/2002   Procedure: LEFT HEART CATH AND CORONARY ANGIOGRAPHY; Location: [facility]; Surgeon: Margie Lovelace, MD   LEFT HEART CATH AND CORONARY ANGIOGRAPHY Left 05/25/2012   Procedure: LEFT HEART CATH AND CORONARY ANGIOGRAPHY; Location: [facility]; Surgeon: Margie Lovelace, MD   LEFT HEART CATH AND CORONARY ANGIOGRAPHY Left 10/09/2013   Procedure: LEFT HEART CATH AND CORONARY ANGIOGRAPHY; Location: [facility]; Surgeon: Vinie Jude, MD   LEFT HEART CATH AND CORS/GRAFTS ANGIOGRAPHY N/A 08/08/2018   Procedure: LEFT HEART CATH AND CORS/GRAFTS ANGIOGRAPHY;  Surgeon: Fernand Denyse LABOR, MD;  Location: [facility];  Service: Cardiovascular;  Laterality: N/A;   LEFT HEART CATH AND CORS/GRAFTS ANGIOGRAPHY N/A 01/30/2019   Procedure: LEFT HEART CATH AND CORS/GRAFTS ANGIOGRAPHY;  Surgeon: Fernand Denyse LABOR, MD;  Location: [facility];  Service:  Cardiovascular;  Laterality: N/A;   LIGATION ARTERIOVENOUS GORTEX GRAFT Right 04/20/2024  Procedure: LIGATION ARTERIOVENOUS GORTEX GRAFT;  Surgeon: Marea Selinda RAMAN, MD;  Location: [facility] OR[facility];  Service: Vascular;  Laterality: Right;   LOWER EXTREMITY ANGIOGRAPHY Left 04/24/2024   Procedure: Lower Extremity Angiography;  Surgeon: Marea Selinda RAMAN, MD;  Location: [facility];  Service: Cardiovascular;  Laterality: Left;   ultrasound guided pericardiocentesis     UPPER EXTREMITY ANGIOGRAPHY Right 03/30/2024   Procedure: Upper Extremity Angiography;  Surgeon: Marea Selinda RAMAN, MD;  Location: [facility];  Service: Cardiovascular;  Laterality: Right;     Social History:   reports that she has been smoking cigarettes. She has a 5 pack-year smoking history. She has never used smokeless tobacco. She reports that she does not drink alcohol and does not use drugs.   Family History:  Her family history includes Breast cancer in her maternal aunt and maternal grandmother; Cancer in an other family member; Diabetes type II in her maternal grandmother and mother; Hypertension in her father, mother, sister, and another family member; Kidney disease in her father; Ovarian cancer in her mother; Renal Disease in an other family member.   Allergies Allergies[1]   Home Medications  Prior to Admission medications  Medication Sig Start Date End Date Taking? Authorizing Provider  albuterol  (VENTOLIN  HFA) 108 (90 Base) MCG/ACT inhaler Inhale 1-2 puffs into the lungs every 6 (six) hours as needed for wheezing or shortness of breath. 01/04/24   Orlean Alan HERO, FNP  apixaban  (ELIQUIS ) 5 MG TABS tablet Take 5 mg by mouth 2 (two) times daily. 09/11/19   [provider]  cephALEXin  (KEFLEX ) 500 MG capsule Take 1 capsule (500 mg total) by mouth 2 (two) times daily. 05/23/24   Fernand Fredy RAMAN, MD  colchicine  0.6 MG tablet Take 1 tablet (0.6 mg total) by mouth once for 1 dose. Patient not taking: Reported on  05/23/2024 04/13/24 05/19/24  Scoggins, [facility], NP  doxycycline  (VIBRA -TABS) 100 MG tablet Take 1 tablet (100 mg total) by mouth 2 (two) times daily. Patient not taking: Reported on 05/23/2024 05/09/24   Janit Thresa HERO, DPM  hydrOXYzine  (ATARAX /VISTARIL ) 50 MG tablet Take 50 mg by mouth at bedtime.    [provider]  levothyroxine  (SYNTHROID ) 75 MCG tablet Take 75 mcg by mouth daily before breakfast.  11/02/18   [provider]  lidocaine -prilocaine  (EMLA ) cream Apply 1 Application topically once. 01/22/21   [provider]  metoprolol  succinate (TOPROL -XL) 25 MG 24 hr tablet Take 1 tablet by mouth every morning. 08/27/23 08/26/24  [provider]  midodrine  (PROAMATINE ) 10 MG tablet Take 1 tablet (10 mg total) by mouth 3 (three) times daily. 02/17/24   Fernand Denyse LABOR, MD  oxyCODONE -acetaminophen  (PERCOCET) 5-325 MG tablet Take 1 tablet by mouth 2 (two) times daily as needed for severe pain (pain score 7-10). 05/23/24   Fernand Fredy RAMAN, MD  pantoprazole  (PROTONIX ) 40 MG tablet Take 40 mg by mouth every morning. 11/29/23   [provider]  ranolazine  (RANEXA ) 1000 MG SR tablet Take 1,000 mg by mouth 2 (two) times daily. 03/18/23   [provider]  rosuvastatin  (CRESTOR ) 20 MG tablet Take 20 mg by mouth at bedtime. 11/07/21   [provider]  sevelamer  carbonate (RENVELA ) 800 MG tablet Take 3,200 mg by mouth 3 (three) times daily with meals.    [provider]  triamcinolone  cream (KENALOG ) 0.1 % Apply 1 Application topically 2 (two) times daily. 11/02/23   Orlean Alan HERO, FNP     Critical care provider  statement:   Total critical care time: 63 minutes   Performed by: Parris MD   Critical care time was exclusive of separately billable procedures and treating other patients.   Critical care was necessary to treat or prevent imminent or life-threatening deterioration.   Critical care was time spent personally by me on the following  activities: development of treatment plan with patient and/or surrogate as well as nursing, discussions with consultants, evaluation of patient's response to treatment, examination of patient, obtaining history from patient or surrogate, ordering and performing treatments and interventions, ordering and review of laboratory studies, ordering and review of radiographic studies, pulse oximetry and re-evaluation of patient's condition.    Avelynn Sellin, M.D.  Pulmonary & Critical Care Medicine                 [1]  Allergies Allergen Reactions   Morphine  And Codeine Hives   Levaquin [Levofloxacin] Itching and Other (See Comments)    Severe itching; prickly sensation; severe left leg pain; immobility.   Enalapril  Other (See Comments)    hallucinations   Latex Rash   Tape Rash and Other (See Comments)   "

## 2024-07-06 NOTE — Consult Note (Addendum)
 PHARMACY CONSULT NOTE - ELECTROLYTES  Pharmacy Consult for Electrolyte Monitoring and Replacement   Recent Labs: Height: 5' 6 (167.6 cm) Weight: 75.8 kg (167 lb 1.7 oz) IBW/kg (Calculated) : 59.3 Estimated Creatinine Clearance: 21.7 mL/min (A) (by C-G formula based on SCr of 2.83 mg/dL (H)). Potassium (mmol/L)  Date Value  07/06/2024 4.0  07/11/2014 4.7   Magnesium  (mg/dL)  Date Value  98/77/7973 2.1  04/10/2014 1.9   Calcium  (mg/dL)  Date Value  98/77/7973 10.0   Calcium , Total (mg/dL)  Date Value  98/74/7983 8.3 (L)   Albumin  (g/dL)  Date Value  98/77/7973 3.0 (L)  07/07/2014 3.1 (L)   Phosphorus (mg/dL)  Date Value  98/77/7973 2.8  07/09/2014 3.9   Sodium (mmol/L)  Date Value  07/06/2024 130 (L)  07/09/2014 139    Assessment  [name] is a 64 y.o. female presenting with syncope/in-hospital cardiac arrest. PMH significant for ESRD HD MWF, AF on eliquis , HFrEF, COPD, prior CVA. Pharmacy has been consulted to monitor and replace electrolytes.  Diet: Vital AF at 20 mL/hr + free water  flushes at 30 mL every 4 hours  Goal of Therapy: Electrolytes WNL  Plan:  No repletion indicated today Check BMP, Mg, Phos with AM labs after HD session today  Thank you for allowing pharmacy to be a part of this patient's care.  Belvie Macintosh, PharmD Candidate 07/06/2024 8:14 AM

## 2024-07-07 ENCOUNTER — Inpatient Hospital Stay

## 2024-07-07 DIAGNOSIS — R188 Other ascites: Secondary | ICD-10-CM

## 2024-07-07 DIAGNOSIS — J449 Chronic obstructive pulmonary disease, unspecified: Secondary | ICD-10-CM | POA: Diagnosis not present

## 2024-07-07 DIAGNOSIS — I2510 Atherosclerotic heart disease of native coronary artery without angina pectoris: Secondary | ICD-10-CM | POA: Diagnosis not present

## 2024-07-07 DIAGNOSIS — R6521 Severe sepsis with septic shock: Secondary | ICD-10-CM | POA: Diagnosis not present

## 2024-07-07 DIAGNOSIS — Z515 Encounter for palliative care: Secondary | ICD-10-CM

## 2024-07-07 DIAGNOSIS — Z992 Dependence on renal dialysis: Secondary | ICD-10-CM | POA: Diagnosis not present

## 2024-07-07 DIAGNOSIS — R7881 Bacteremia: Secondary | ICD-10-CM | POA: Diagnosis not present

## 2024-07-07 DIAGNOSIS — J9621 Acute and chronic respiratory failure with hypoxia: Secondary | ICD-10-CM

## 2024-07-07 DIAGNOSIS — I5023 Acute on chronic systolic (congestive) heart failure: Secondary | ICD-10-CM

## 2024-07-07 DIAGNOSIS — B9562 Methicillin resistant Staphylococcus aureus infection as the cause of diseases classified elsewhere: Secondary | ICD-10-CM | POA: Diagnosis not present

## 2024-07-07 DIAGNOSIS — R55 Syncope and collapse: Secondary | ICD-10-CM | POA: Diagnosis not present

## 2024-07-07 DIAGNOSIS — I4891 Unspecified atrial fibrillation: Secondary | ICD-10-CM | POA: Diagnosis not present

## 2024-07-07 DIAGNOSIS — A419 Sepsis, unspecified organism: Secondary | ICD-10-CM | POA: Diagnosis not present

## 2024-07-07 DIAGNOSIS — E875 Hyperkalemia: Secondary | ICD-10-CM | POA: Diagnosis not present

## 2024-07-07 DIAGNOSIS — I469 Cardiac arrest, cause unspecified: Secondary | ICD-10-CM | POA: Diagnosis not present

## 2024-07-07 DIAGNOSIS — N186 End stage renal disease: Secondary | ICD-10-CM | POA: Diagnosis not present

## 2024-07-07 DIAGNOSIS — B954 Other streptococcus as the cause of diseases classified elsewhere: Secondary | ICD-10-CM | POA: Diagnosis not present

## 2024-07-07 LAB — RENAL FUNCTION PANEL
Albumin: 2.8 g/dL — ABNORMAL LOW (ref 3.5–5.0)
Anion gap: 15 (ref 5–15)
BUN: 58 mg/dL — ABNORMAL HIGH (ref 8–23)
CO2: 26 mmol/L (ref 22–32)
Calcium: 10.9 mg/dL — ABNORMAL HIGH (ref 8.9–10.3)
Chloride: 89 mmol/L — ABNORMAL LOW (ref 98–111)
Creatinine, Ser: 3.63 mg/dL — ABNORMAL HIGH (ref 0.44–1.00)
GFR, Estimated: 14 mL/min — ABNORMAL LOW
Glucose, Bld: 108 mg/dL — ABNORMAL HIGH (ref 70–99)
Phosphorus: 3 mg/dL (ref 2.5–4.6)
Potassium: 4.1 mmol/L (ref 3.5–5.1)
Sodium: 129 mmol/L — ABNORMAL LOW (ref 135–145)

## 2024-07-07 LAB — COOXEMETRY PANEL
Carboxyhemoglobin: 2.4 % — ABNORMAL HIGH (ref 0.5–1.5)
Methemoglobin: <0.7 % (ref 0.0–1.5)
O2 Saturation: 81.8 %
Total hemoglobin: 10.7 g/dL — ABNORMAL LOW (ref 12.0–16.0)
Total oxygen content: 79.4 %

## 2024-07-07 LAB — CBC
HCT: 32.1 % — ABNORMAL LOW (ref 36.0–46.0)
Hemoglobin: 11.5 g/dL — ABNORMAL LOW (ref 12.0–15.0)
MCH: 31 pg (ref 26.0–34.0)
MCHC: 35.8 g/dL (ref 30.0–36.0)
MCV: 86.5 fL (ref 80.0–100.0)
Platelets: 84 K/uL — ABNORMAL LOW (ref 150–400)
RBC: 3.71 MIL/uL — ABNORMAL LOW (ref 3.87–5.11)
RDW: 18.6 % — ABNORMAL HIGH (ref 11.5–15.5)
WBC: 17.1 K/uL — ABNORMAL HIGH (ref 4.0–10.5)
nRBC: 1.1 % — ABNORMAL HIGH (ref 0.0–0.2)

## 2024-07-07 LAB — CULTURE, BLOOD (ROUTINE X 2): Special Requests: ADEQUATE

## 2024-07-07 LAB — GLUCOSE, CAPILLARY
Glucose-Capillary: 110 mg/dL — ABNORMAL HIGH (ref 70–99)
Glucose-Capillary: 106 mg/dL — ABNORMAL HIGH (ref 70–99)
Glucose-Capillary: 98 mg/dL (ref 70–99)
Glucose-Capillary: 92 mg/dL (ref 70–99)
Glucose-Capillary: 110 mg/dL — ABNORMAL HIGH (ref 70–99)
Glucose-Capillary: 91 mg/dL (ref 70–99)

## 2024-07-07 LAB — APTT: aPTT: 78 s — ABNORMAL HIGH (ref 24–36)

## 2024-07-07 LAB — MAGNESIUM: Magnesium: 2.1 mg/dL (ref 1.7–2.4)

## 2024-07-07 LAB — HEPARIN LEVEL (UNFRACTIONATED): Heparin Unfractionated: 0.46 [IU]/mL (ref 0.30–0.70)

## 2024-07-07 MED ORDER — HEPARIN SODIUM (PORCINE) 1000 UNIT/ML IJ SOLN
INTRAMUSCULAR | Status: AC
  Filled 2024-07-07: qty 5

## 2024-07-07 MED ORDER — ALBUMIN HUMAN 25 % IV SOLN
25.0000 g | Freq: Once | INTRAVENOUS | Status: AC
  Administered 2024-07-07: 25 g via INTRAVENOUS
  Filled 2024-07-07: qty 100

## 2024-07-07 MED ORDER — METOPROLOL TARTRATE 25 MG PO TABS
12.5000 mg | ORAL_TABLET | Freq: Two times a day (BID) | ORAL | Status: DC
  Filled 2024-07-07: qty 1

## 2024-07-07 NOTE — Progress Notes (Signed)
 EP brief tele note  PVC burden appears increased, continues to have brief NSVT episodes. No sustained VT/VF.  She remains on IV amiodarone , would continue until extubated.  Dialysis planned for today.  Would start low-dose BB after dialysis, historically has hypotension limiting HD sessions.   OK to transition to PO amio tomorrow if VT/VF remains quiescent.    Discussed with Dr. Inocencio.    Vanessa Bieker, NP Electrophysiology 07/07/24 11:36 AM

## 2024-07-07 NOTE — Progress Notes (Signed)
 "                                                                                                                                                                                               Palliative Care Progress Note, Assessment & Plan   Patient Name: [name]       Date: 07/07/2024 DOB: [birth year]  Age: 64 y.o. MRN#: [id number] Attending Physician: Isadora Hose, MD Primary Care Physician: Orlean Alan HERO, FNP Admit Date: 07/01/2024  Subjective: Patient is lying in bed, intubated but off sedation since 9 AM this morning.  Her brother Fairy and her daughter [name] are present at bedside during my visit.  HPI: 64 y.o. female  with past medical history of ESRD on HD (M,W,F), COPD, HFrEF (25-30%), bipolar disorder, chronic hypotension on midodrine , HLD, recent hospitalization for fluid overload with cardiogenic shock requiring Levophed  and midodrine  (06/22/2024) and previous CVA admitted on 07/01/2024 with chief complaint of syncopal episode after being being found down by her brother at home.    Per chart review, patient was scheduled for hemodialysis on Friday PTA but could not completed due to unknown reasons.   Upon presentation to the ED, patient was sleepy but arousable, able to make conversation and her wishes known.   Radiograph of chest revealed possible interstitial prominences while radiograph of the pelvis and wrist were negative for fractures.   EKG revealed normal sinus rhythm with RBBB.   CT of brain and cervical spine were without fractures or bleeding.   Afternoon of 1/18, patient underwent dialysis.  Subsequently, unit secretary was notified via telemetry monitoring that patient was in V-fib.  RN found patient unresponsive and CODE BLUE was activated.  Patient was in V-fib arrest.  Patient received 2 rounds of epinephrine , calcium , bicarb, and shock with return of spontaneous circulation.  EDP intubated paient. Patient also given 300 IV amiodarone .  Cardiology was  consulted.  Patient was transferred to ICU.   Significant events in ICU are as follows:   07/03/24- overnight, unresposive with AF. MWF(HD). Will meet with family. Will continue to monitor and re-evaluate post dialysis.  07/04/24- patient had few episodes of VT. She is DNR code status.  She was seen by cardiology, she has poor prognosis. No procedures are planned.  07/05/24- patient had hypotensive episode during HD so it was interuppted.  The lidocaine  infusion will be dcd today and amiodarone  will continue.  Paracentesis today yielded 3.5liters ascitic fluid 1/22 - plan for turning off sedation and WUA  PMT was consulted to support patient with family with goals of care discussions.   Of note,  patient is familiar to our service as my colleagues met with patient during her most recent hospitalization-06/22/2024.  Following up today for continued discussion on boundaries of medical care and prognosis.  Summary of counseling/coordination of care: Chart review completed prior to meeting patient including:  -Labs: Sodium 130, creatinine 2.83, WBCs 15.5 -Vital signs: BP stable but heart rate bradycardic at high 30s-40s -Progress notes: As per cardiology note, lidocaine  was discontinued and Amio was restarted, 1 episode of NSVT this morning but otherwise improved ectopy -Orders: In for turning off sedation and wake up assessment today -Available advanced directive documents from current and previous encounters: Note from previous BMT provider on 1/10 patient was accepting of all offered, available, and interventions to sustain  After reviewing the patient's chart and assessing the patient at bedside, I spoke with patient's daughter and brother in regards to symptom management and goals of care.   Therapeutic silence and active listening provided for patient's family to share their thoughts and emotions regarding current medical situation.  Emotional support provided.  Discussed patient has stable  with no acute significant events.  Reviewed that sedation has been turned off and wake up assessment will be performed when patient hopefully is fully awake/alert.  However, cautioned family that given patient's ESRD it may take longer for sedation to wear off.  Therefore, watchful waiting will continue for today.  Discussed patient likely confusion and encouraged him to speak to her for comfort and a reassuring/familiar voice.  Attempted to discuss next steps with patient's family at bedside.  However, they share they have other questions answered and have no needs at this time.  Discussed that CCM can also provide updates but that PMT remains available to support patient and family throughout her hospitalization.  They are appreciative of my visit they have no acute palliative needs or concerns at this time.  No change to plan of care.  Awaiting WUA.  Physical Exam Vitals reviewed.  Constitutional:      General: She is not in acute distress.    Appearance: She is ill-appearing.  HENT:     Head: Normocephalic.  Cardiovascular:     Rate and Rhythm: Bradycardia present.  Pulmonary:     Comments: Ventilatory support Abdominal:     Palpations: Abdomen is soft.  Skin:    General: Skin is warm and dry.              Recommendations:   No change to plan of care Ongoing support for family to continue from PMT Awaiting WUA to determine next steps for patient  I personally spent a total of 30 minutes in the care of the patient today including preparing to see the patient, getting/reviewing separately obtained history, performing a medically appropriate exam/evaluation, counseling and educating, and documenting clinical information in the EHR.  .sign   "

## 2024-07-07 NOTE — Progress Notes (Signed)
 "  Date of Admission:  07/01/2024    ID: [name] is a 64 y.o. female  Principal Problem:   Syncope and collapse Active Problems:   ESRD on hemodialysis (HCC)   Chronic hypotension   S/P CABG x 3   Mild dementia associated with other underlying disease, without behavioral disturbance, psychotic disturbance, mood disturbance, or anxiety (HCC)   Anemia of chronic disease   Paroxysmal atrial fibrillation (HCC)   Hypothyroidism   Chronic combined systolic and diastolic CHF (congestive heart failure) (HCC)   Hepatitis C antibody positive in blood   Other cirrhosis of liver (HCC)   Hypertension   Bipolar disorder in full remission, most recent episode unspecified type   Chronic pain syndrome   Other secondary kyphosis, cervical region   Cervical spondylosis   Fall   Acute pulmonary edema (HCC)   Bacteremia due to Streptococcus   MRSA bacteremia   ESRD (end stage renal disease) on dialysis (HCC)    Subjective: Remains intubated Opens eyes  Medications:   Chlorhexidine  Gluconate Cloth  6 each Topical Daily   collagenase   1 Application Topical Daily   feeding supplement (PROSource TF20)  60 mL Per Tube Daily   feeding supplement (VITAL AF 1.2 CAL)  1,000 mL Per Tube Q24H   free water   30 mL Per Tube Q4H   levothyroxine   75 mcg Oral Q0600   metoprolol  tartrate  12.5 mg Oral BID   midodrine   10 mg Oral TID WC   multivitamin  1 tablet Per Tube QHS   nutrition supplement (JUVEN)  1 packet Per Tube BID BM   mouth rinse  15 mL Mouth Rinse Q2H   pantoprazole  (PROTONIX ) IV  40 mg Intravenous Daily   sodium chloride  flush  10-40 mL Intracatheter Q12H   venlafaxine   37.5 mg Oral BID WC    Objective: Vital signs in last 24 hours: Patient Vitals for the past 24 hrs:  BP Temp Temp src Pulse Resp SpO2 Weight  07/07/24 1330 (!) 121/52 -- -- (!) 54 (!) 27 96 % --  07/07/24 1315 (!) 112/50 -- -- (!) 59 (!) 30 (!) 89 % --  07/07/24 1300 135/63 -- -- 66 (!) 25 (!) 89 % --   07/07/24 1245 107/61 -- -- (!) 46 (!) 28 94 % --  07/07/24 1230 (!) 114/55 -- -- (!) 53 16 94 % --  07/07/24 1215 116/60 -- -- (!) 56 (!) 26 95 % --  07/07/24 1200 (!) 118/53 98.2 F (36.8 C) Axillary (!) 56 (!) 25 94 % --  07/07/24 1156 -- -- -- (!) 57 (!) 27 94 % --  07/07/24 1145 (!) 116/57 -- -- (!) 51 (!) 26 95 % --  07/07/24 1130 115/65 -- -- (!) 58 (!) 25 94 % --  07/07/24 1115 (!) 115/59 -- -- (!) 57 (!) 24 94 % --  07/07/24 1100 (!) 112/53 -- -- 61 (!) 28 94 % --  07/07/24 1045 (!) 108/58 -- -- (!) 55 (!) 22 94 % --  07/07/24 1030 (!) 108/55 -- -- (!) 49 18 94 % --  07/07/24 1015 (!) 110/52 -- -- (!) 57 (!) 26 94 % --  07/07/24 1000 (!) 109/53 -- -- (!) 57 (!) 27 94 % --  07/07/24 0945 (!) 107/56 -- -- (!) 45 (!) 32 94 % --  07/07/24 0930 (!) 103/58 -- -- (!) 45 20 94 % --  07/07/24 0915 (!) 105/53 -- -- (!) 49 ROLLEN)  25 94 % --  07/07/24 0900 121/68 -- -- (!) 54 (!) 29 95 % --  07/07/24 0845 120/65 -- -- 60 (!) 25 95 % --  07/07/24 0830 130/65 -- -- (!) 53 (!) 29 95 % --  07/07/24 0815 121/65 -- -- (!) 59 (!) 32 95 % --  07/07/24 0800 117/62 98.3 F (36.8 C) Axillary (!) 49 (!) 27 94 % --  07/07/24 0745 (!) 123/55 -- -- (!) 56 (!) 22 95 % --  07/07/24 0730 (!) 142/125 -- -- (!) 56 (!) 21 95 % --  07/07/24 0715 (!) 129/106 -- -- (!) 57 (!) 24 95 % --  07/07/24 0700 117/78 -- -- (!) 59 (!) 21 95 % --  07/07/24 0645 128/70 -- -- (!) 56 (!) 23 95 % --  07/07/24 0644 128/70 -- -- 60 (!) 21 95 % --  07/07/24 0630 122/60 -- -- (!) 52 20 95 % --  07/07/24 0615 (!) 119/59 -- -- (!) 51 20 94 % --  07/07/24 0600 111/60 -- -- (!) 59 19 95 % --  07/07/24 0545 (!) 113/55 -- -- 62 20 95 % --  07/07/24 0530 116/67 -- -- 61 20 95 % --  07/07/24 0515 123/61 -- -- 60 20 95 % --  07/07/24 0500 114/60 -- -- 60 18 95 % 75.2 kg  07/07/24 0445 (!) 115/53 -- -- (!) 59 20 95 % --  07/07/24 0430 122/62 -- -- 62 19 95 % --  07/07/24 0415 134/63 -- -- (!) 52 20 95 % --  07/07/24 0400 (!) 142/62  99.2 F (37.3 C) Axillary 64 (!) 21 95 % --  07/07/24 0345 132/73 -- -- 65 (!) 22 95 % --  07/07/24 0330 127/61 -- -- (!) 54 (!) 26 94 % --  07/07/24 0315 129/61 -- -- (!) 48 (!) 21 94 % --  07/07/24 0300 119/61 -- -- (!) 58 (!) 23 94 % --  07/07/24 0245 131/68 -- -- 61 (!) 22 93 % --  07/07/24 0230 125/61 -- -- (!) 42 (!) 21 95 % --  07/07/24 0215 (!) 134/56 -- -- (!) 50 17 95 % --  07/07/24 0200 125/64 -- -- (!) 53 20 94 % --  07/07/24 0145 126/65 -- -- 65 (!) 21 -- --  07/07/24 0130 (!) 121/40 -- -- (!) 58 (!) 24 -- --  07/07/24 0115 (!) 127/54 -- -- (!) 53 (!) 23 95 % --  07/07/24 0100 118/62 -- -- (!) 59 (!) 26 95 % --  07/07/24 0045 (!) 119/57 -- -- 62 (!) 23 94 % --  07/07/24 0030 123/60 -- -- (!) 48 (!) 21 95 % --  07/07/24 0015 125/60 -- -- (!) 56 18 99 % --  07/07/24 0000 (!) 119/52 -- -- 60 (!) 29 94 % --  07/06/24 2345 123/65 99.8 F (37.7 C) Axillary 69 (!) 33 94 % --  07/06/24 2330 (!) 119/58 -- -- (!) 50 (!) 31 95 % --  07/06/24 2315 116/60 -- -- (!) 57 (!) 32 94 % --  07/06/24 2300 (!) 112/57 -- -- 65 (!) 34 93 % --  07/06/24 2245 120/62 -- -- -- (!) 33 -- --  07/06/24 2230 (!) 122/52 -- -- 60 (!) 25 93 % --  07/06/24 2215 138/64 -- -- 62 (!) 25 93 % --  07/06/24 2200 132/74 -- -- 67 (!) 23 (!) 89 % --  07/06/24 2145 131/78 -- --  69 (!) 23 91 % --  07/06/24 2130 133/76 -- -- 65 (!) 25 91 % --  07/06/24 2115 136/63 -- -- 69 (!) 30 -- --  07/06/24 2100 121/60 -- -- 64 (!) 30 92 % --  07/06/24 2045 114/62 -- -- (!) 55 (!) 29 92 % --  07/06/24 2033 -- -- -- -- -- 96 % --  07/06/24 2030 121/62 -- -- (!) 55 (!) 31 95 % --  07/06/24 2015 111/66 -- -- (!) 53 (!) 31 96 % --  07/06/24 2000 120/60 -- -- (!) 53 (!) 28 98 % --  07/06/24 1945 120/65 98.3 F (36.8 C) Axillary (!) 51 (!) 28 97 % --  07/06/24 1930 112/66 -- -- (!) 57 (!) 27 100 % --  07/06/24 1915 (!) 113/56 -- -- 63 (!) 26 100 % --  07/06/24 1900 (!) 127/56 -- -- -- (!) 30 -- --  07/06/24 1845 124/63 -- -- --  (!) 33 -- --  07/06/24 1830 129/67 -- -- -- (!) 30 -- --  07/06/24 1815 124/68 -- -- -- (!) 28 -- --  07/06/24 1800 126/69 -- -- -- (!) 29 -- --  07/06/24 1745 128/68 -- -- -- (!) 32 -- --  07/06/24 1730 120/60 -- -- -- (!) 33 -- --  07/06/24 1715 124/69 -- -- (!) 56 (!) 29 95 % --  07/06/24 1700 118/64 -- -- (!) 57 (!) 31 95 % --  07/06/24 1645 117/67 -- -- 66 (!) 32 96 % --  07/06/24 1630 (!) 117/54 -- -- 66 (!) 27 95 % --  07/06/24 1615 119/69 -- -- 62 (!) 30 92 % --  07/06/24 1600 120/73 -- -- 63 (!) 32 92 % --  07/06/24 1515 118/70 -- -- 66 (!) 29 90 % --  07/06/24 1500 121/65 -- -- 62 (!) 34 93 % --  07/06/24 1445 99/79 -- -- 67 (!) 32 90 % --  07/06/24 1430 127/72 -- -- (!) 56 (!) 31 96 % --  07/06/24 1415 127/71 -- -- (!) 46 (!) 27 98 % --       PHYSICAL EXAM:  General: Intubated Lungs: b/la ir enry Heart: s1s2 Abdomen: Soft, non-tender,not distended. Bowel sounds normal. No masses Extremities: atraumatic, no cyanosis. No edema. No clubbing Skin: No rashes or lesions. Or bruising Lymph: Cervical, supraclavicular normal. Neurologic: cannot assess  Lab Results    Latest Ref Rng & Units 07/07/2024    4:12 AM 07/06/2024    4:07 AM 07/05/2024    4:39 AM  CBC  WBC 4.0 - 10.5 K/uL 17.1  15.5  13.7   Hemoglobin 12.0 - 15.0 g/dL 88.4  88.2  88.7   Hematocrit 36.0 - 46.0 % 32.1  33.7  32.6   Platelets 150 - 400 K/uL 84  78  68        Latest Ref Rng & Units 07/07/2024    4:12 AM 07/06/2024    4:07 AM 07/05/2024    4:39 AM  CMP  Glucose 70 - 99 mg/dL 891  96  79   BUN 8 - 23 mg/dL 58  29  36   Creatinine 0.44 - 1.00 mg/dL 6.36  7.16  6.02   Sodium 135 - 145 mmol/L 129  130  131   Potassium 3.5 - 5.1 mmol/L 4.1  4.0  3.7   Chloride 98 - 111 mmol/L 89  90  91   CO2 22 - 32 mmol/L 26  26  25   Calcium  8.9 - 10.3 mg/dL 89.0  89.9  89.7       Microbiology:     Assessment/Plan: 64 y.o. female with a history of ESRD, CAD s/p CABG, Afib ICM, h/o pericardial effusion  h/o PEA arrest , steal syndrome rt arm with ligation of rt brachial artery to axillary vein graft on 04/20/24 recently in the hospital between 06/22/24 until 06/28/24 for syncope, fluid over load, chronic hypotension, cardiogenic shock EF 25-30%,  presenting with fall/ weakness and found on the floor   Syncope Related to her cardiac state    Severe ischemic cardiomyopathy with biventricular failure    ESRD on dialysis thru a catheter   Ventricular fibrillation and cardiac arrest on 07/02/24- resucitated - now intubated  on pressor and amiodarone    Strep gordonii bacteremia- MRSA bacteremia   blood culture done after resuscitation though she had fever on the day of admission  but no blood  culture sent on admission HD cath will have to be removed Vancomycin  started Continue ceftriaxone  until peritoneal fluid culture is back Repeat blood cultrue has been sent 2 d echo      C5-C6 vertebral collaspe of endplates- degeneration VS burnt out infection     Ascites s/p paracentesis- possible cirrhosis could be cardiac cirrhosis Cell coulnt 605 ( 44% N) Protein not sent Culture neg so far   Chronic thrombocytopenia  likely due to cirrhosis Anemia      HEPC antibody positive but RNA negative -= so no active infection   Stasis dermatitis with superficial ulcers- question of 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 Discussed the management with the intensivist ID will not routinely see her this weekend Call if needed    "

## 2024-07-07 NOTE — Progress Notes (Signed)
Pt transported to MRI on the vent and returned to ICU without incident. Pt remains on the vent and is tol well at this time.

## 2024-07-07 NOTE — Progress Notes (Signed)
 [facility] CLINIC CARDIOLOGY PROGRESS NOTE   Patient ID: [name] MRN: [id number] DOB/AGE: 62-Sep-1962 62 y.o.  Admit date: 07/01/2024 Referring Physician Dr. Devaughn Ban  Primary Physician Orlean Alan HERO, FNP  Primary Cardiologist Dr. Denyse Bathe Reason for Consultation syncope  HPI: [name] is a 64 y.o. female with a past medical history of chronic HFrEF, coronary artery disease, atrial fibrillation on eliquis , COPD, ESRD on HD, bipolar disorder, hypotension on midodrine , hx CVA  who presented to the ED on 07/01/2024 for syncopal event at home. Cardiology was consulted for further evaluation.   Interval History:  -Patient seen and examined this AM, remains intubated.  Has been off of sedation since yesterday, did move her head to the sound of my voice this morning. - Still with some intermittent episodes of NSVT, PVCs. - BP remains stable off pressors.   Review of systems complete and found to be negative unless listed above   Vitals:   07/07/24 0700 07/07/24 0715 07/07/24 0730 07/07/24 0745  BP: 117/78 (!) 129/106 (!) 142/125 (!) 123/55  Pulse: (!) 59 (!) 57 (!) 56 (!) 56  Resp: (!) 21 (!) 24 (!) 21 (!) 22  Temp:      TempSrc:      SpO2: 95% 95% 95% 95%  Weight:      Height:         Intake/Output Summary (Last 24 hours) at 07/07/2024 9188 Last data filed at 07/07/2024 0700 Gross per 24 hour  Intake 1577.92 ml  Output --  Net 1577.92 ml     PHYSICAL EXAM General: Ill appearing female, intubated/sedated. HEENT: Normocephalic and atraumatic. Neck: No JVD.  Lungs: Mechanical breath sounds Heart: HRRR. Normal S1 and S2 without gallops or murmurs. Radial & DP pulses 2+ bilaterally. Abdomen: Non-distended appearing.  Msk: Normal strength and tone for age. Extremities: No clubbing, cyanosis or edema.     LABS: Basic Metabolic Panel: Recent Labs    07/06/24 0407 07/07/24 0412  NA 130* 129*  K 4.0 4.1  CL 90* 89*  CO2 26 26  GLUCOSE 96 108*  BUN  29* 58*  CREATININE 2.83* 3.63*  CALCIUM  10.0 10.9*  MG 2.1 2.1  PHOS 2.8 3.0   Liver Function Tests: Recent Labs    07/06/24 0407 07/07/24 0412  ALBUMIN  3.0* 2.8*   No results for input(s): LIPASE, AMYLASE in the last 72 hours. CBC: Recent Labs    07/06/24 0407 07/07/24 0412  WBC 15.5* 17.1*  HGB 11.7* 11.5*  HCT 33.7* 32.1*  MCV 89.4 86.5  PLT 78* 84*   Cardiac Enzymes: No results for input(s): CKTOTAL, CKMB, CKMBINDEX, TROPONINIHS in the last 72 hours. BNP: No results for input(s): BNP in the last 72 hours. D-Dimer: No results for input(s): DDIMER in the last 72 hours. Hemoglobin A1C: No results for input(s): HGBA1C in the last 72 hours. Fasting Lipid Panel: Recent Labs    07/06/24 0407  TRIG 155*   Thyroid  Function Tests: No results for input(s): TSH, T4TOTAL, T3FREE, THYROIDAB in the last 72 hours.  Invalid input(s): FREET3 Anemia Panel: No results for input(s): VITAMINB12, FOLATE, FERRITIN, TIBC, IRON, RETICCTPCT in the last 72 hours.  No results found.    ECHO 06/23/2024: 1. Left ventricular ejection fraction, by estimation, is 25 to 30%. The left ventricle has severely decreased function. The left ventricle demonstrates global hypokinesis. The left ventricular internal cavity size was severely dilated. Left ventricular diastolic function could not be evaluated.   2. Right ventricular systolic  function is severely reduced. The right  ventricular size is severely enlarged. Mildly increased right ventricular  wall thickness.   3. Left atrial size was mildly dilated.   4. Right atrial size was severely dilated.   5. The mitral valve is normal in structure. Mild mitral valve regurgitation.   6. Tricuspid valve regurgitation is severe.   7. The aortic valve is grossly normal. Aortic valve regurgitation is not visualized. Aortic valve sclerosis is present, with no evidence of aortic  valve stenosis.   TELEMETRY  (personally reviewed): Sinus rhythm rate 60s, PVCs  EKG (personally reviewed): AF RBBB rate 167 bpm  DATA reviewed by me 07/07/24: last 24h vitals tele labs imaging I/O, hospitalist progress note  Principal Problem:   Syncope and collapse Active Problems:   ESRD on hemodialysis (HCC)   Chronic hypotension   S/P CABG x 3   Mild dementia associated with other underlying disease, without behavioral disturbance, psychotic disturbance, mood disturbance, or anxiety (HCC)   Anemia of chronic disease   Paroxysmal atrial fibrillation (HCC)   Hypothyroidism   Chronic combined systolic and diastolic CHF (congestive heart failure) (HCC)   Hepatitis C antibody positive in blood   Other cirrhosis of liver (HCC)   Hypertension   Bipolar disorder in full remission, most recent episode unspecified type   Chronic pain syndrome   Other secondary kyphosis, cervical region   Cervical spondylosis   Fall   Acute pulmonary edema (HCC)   Bacteremia due to Streptococcus   MRSA bacteremia   ESRD (end stage renal disease) on dialysis (HCC)    ASSESSMENT AND PLAN: Vanessa MCERLEAN is a 64 y.o. female with a past medical history of chronic HFrEF, coronary artery disease, atrial fibrillation on eliquis , COPD, ESRD on HD, bipolar disorder, hypotension on midodrine , hx CVA  who presented to the ED on 07/01/2024 for syncopal event at home. Cardiology was consulted for further evaluation.   # Atrial fibrillation RVR # Paroxysmal atrial fibrillation # Syncope/ ?cardiac arrest # Chronic HFrEF # Coronary artery disease Patient brought to ED after being found down at home by family. Initially was admitted to the floor but on 07/02/24 she became unresponsive and was tachycardia, concern for wide complex tachycardia but has RBBB at baseline. Underwent ACLS, received 1 shock and 2 doses of epi with ROSC. Started on IV amiodarone .  Lidocaine  started 07/04/2024 by EP.  Overnight on 07/05/2024 developed bradycardia and thus  IV amiodarone  was discontinued. 07/05/24 EP discontinued lidocaine  and restarted amiodarone .  -EP following, appreciate their assistance.  -Continue IV heparin .  -Further management per primary team, appreciate their assistance. No plan for invasive cardiac procedures at this time. -Agree with palliative care evaluation for continued goals of care conversations.  This patient's case was discussed and created with Dr. Florencio and he is in agreement.  Signed:  Danita Bloch, PA-C  07/07/2024, 8:11 AM [facility] Cardiology

## 2024-07-07 NOTE — Progress Notes (Signed)
 PHARMACY - ANTICOAGULATION CONSULT NOTE  Pharmacy Consult for UFH infusion Indication: atrial fibrillation  Allergies[1]  Patient Measurements: Height: 5' 6 (167.6 cm) Weight: 75.8 kg (167 lb 1.7 oz) IBW/kg (Calculated) : 59.3 HEPARIN  DW (KG): 70.5  Labs: Recent Labs    07/05/24 0439 07/05/24 0800 07/05/24 0820 07/06/24 0407 07/06/24 1309 07/06/24 2107 07/07/24 0412  HGB 11.2*  --   --  11.7*  --   --  11.5*  HCT 32.6*  --   --  33.7*  --   --  32.1*  PLT 68*  --   --  78*  --   --  84*  APTT  --  86*  --  61* 86* 72* 78*  LABPROT  --   --  16.3*  --   --   --   --   INR  --   --  1.2  --   --   --   --   HEPARINUNFRC  --  0.82*  --  0.53  --   --  0.46  CREATININE 3.97*  --   --  2.83*  --   --  3.63*    Estimated Creatinine Clearance: 16.9 mL/min (A) (by C-G formula based on SCr of 3.63 mg/dL (H)).   Medical History: Past Medical History:  Diagnosis Date   Acute lacunar infarction (HCC) 03/03/2014   a.) MRI brain 03/03/2014: two acute punctate lacunar infarcts anteriorly in the BILATERAL centrum semiovale   Adult behavior problems    Anemia of chronic renal failure    Aortic atherosclerosis    Arthritis    Atrial fibrillation and flutter (HCC)    a.) CHA2DS2VASc = 6 (sex, HFrEF, HTN, CVA x 2, prior MI/vascular disease) as of 02/04/2024; b.) rate/rhythm maintained on oral metoprolol  succinate; chronically anticoagulated with apixaban    Bipolar disorder (HCC)    Cardiomegaly    Cerebral microvascular disease    CHF (congestive heart failure) (HCC)    Chicken pox    Cholelithiasis    Chronic respiratory failure with hypoxia (HCC) 12/19/2021   COPD (chronic obstructive pulmonary disease) (HCC)    Coronary artery disease    DDD (degenerative disc disease), cervical    DDD (degenerative disc disease), thoracolumbar    Diverticulosis    Dyspnea    ESRD (end stage renal disease) on dialysis (M,W,F)    GERD (gastroesophageal reflux disease)    Gout    HCV  (hepatitis C virus)    Headache    HFrEF (heart failure with reduced ejection fraction) (HCC)    History of delirium 04/12/2014   History of substance abuse (HCC)    a.) tobacco + cocaine   Hyperkalemia 11/13/2014   Hyperlipidemia    Hypertension    Hypotension 02/24/2014   Hypothyroidism    Ischemic cardiomyopathy    Nose colonized with MRSA 02/01/2024   a.) presurgical PCR (+) on 02/01/2024 prior to A/V FISTULAGRAM; b.) presurgical PCR (+) on 03/07/2024 prior to AV FISTULA CREATION   NSTEMI (non-ST elevated myocardial infarction) (HCC) 10/08/2013   NSTEMI (non-ST elevated myocardial infarction) (HCC) 01/23/2002   a.) LHC 01/24/2002 --> multi-vessel CAD with IRA being 90% OM2 --> delayed PCI until 01/25/2002 --> 3.0 x 23 mm BX Velocity stent   Obesity    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    On apixaban  therapy    Peripheral vascular disease    Pneumonia    Postoperative anemia due to acute blood loss 02/27/2014  Postoperative cerebrovascular infarction following cardiac surgery 02/21/2014   a.) developed RIGHT upper/lower extremity weakness following cardiac revascularization --> neuro consult and head CT --> Hypodensity noted in the area of the LEFT motor cortex consistent with a component of acute ischemia   Pulmonary hypertension (HCC)    RBBB (right bundle branch block)    Renal osteodystrophy    S/P CABG x 3 02/20/2014   a.) LIMA-LAD, SVG-OM1, SVG-PDA   Stroke (HCC) 05/23/2010   a.) brain MRI 05/23/2010: BILATERAL acute infarcts and old prior infarcts; RIGHT frontal lobe extending into the white matter. Small focus of restricted diffusion in the cortex of the medial LEFT frontoparietal lobe. Periventricular T2 and multiple foci of T2 hyperintensity in the subcortical white matter and increased FLAIR signal in the cortex of the LEFT frontal lobe.   Syncope and collapse 07/25/2020   Assessment: 64 y/o F with a h/o atrial fibrillation on apixaban . Last dose was  2024/07/15 at 0900. Patient coded on 07-15-24 with subsequent ROSC and is now in the ICU on ventilation and pressor support. Pharmacy consulted for IV heparin .   Goal of Therapy:  anti-Xa level  0.3-0.7 units/ml aPTT 66-102 seconds Monitor platelets by anticoagulation protocol: Yes   Plan:  --aPTT is therapeutic x 3 / HL therapeutic x 2 --HL now correlating with aPTT, will switch to HL monitoring --Continue heparin  infusion at 1450 units/hr --Re-check HL daily w/ AM labs while therapeutic --Daily CBC  Rankin CANDIE Dills, PharmD, [facility] 07/07/2024 5:35 AM      [1]  Allergies Allergen Reactions   Morphine  And Codeine Hives   Levaquin [Levofloxacin] Itching and Other (See Comments)    Severe itching; prickly sensation; severe left leg pain; immobility.   Enalapril  Other (See Comments)    hallucinations   Latex Rash   Tape Rash and Other (See Comments)

## 2024-07-07 NOTE — Consult Note (Signed)
 PHARMACY CONSULT NOTE - ELECTROLYTES  Pharmacy Consult for Electrolyte Monitoring and Replacement   Recent Labs: Height: 5' 6 (167.6 cm) Weight: 75.2 kg (165 lb 12.6 oz) IBW/kg (Calculated) : 59.3 Estimated Creatinine Clearance: 16.9 mL/min (A) (by C-G formula based on SCr of 3.63 mg/dL (H)). Potassium (mmol/L)  Date Value  07/07/2024 4.1  07/11/2014 4.7   Magnesium  (mg/dL)  Date Value  98/76/7973 2.1  04/10/2014 1.9   Calcium  (mg/dL)  Date Value  98/76/7973 10.9 (H)   Calcium , Total (mg/dL)  Date Value  98/74/7983 8.3 (L)   Albumin  (g/dL)  Date Value  98/76/7973 2.8 (L)  07/07/2014 3.1 (L)   Phosphorus (mg/dL)  Date Value  98/76/7973 3.0  07/09/2014 3.9   Sodium (mmol/L)  Date Value  07/07/2024 129 (L)  07/09/2014 139    Assessment  [name] is a 64 y.o. female presenting with syncope/in-hospital cardiac arrest. PMH significant for ESRD HD MWF, AF on eliquis , HFrEF, COPD, prior CVA. Pharmacy has been consulted to monitor and replace electrolytes.  Diet: Vital AF at 20 mL/hr + free water  flushes at 30 mL every 4 hours  Goal of Therapy:  K 4.0-5.1 Mg > 2  Plan:  Na trending down, 129 today No repletion indicated at this time Check BMP, Mg, Phos with AM labs  Thank you for allowing pharmacy to be a part of this patient's care.  Belvie Macintosh, PharmD Candidate 07/07/2024 8:08 AM

## 2024-07-07 NOTE — Plan of Care (Signed)
 Continuing with plan of care.

## 2024-07-07 NOTE — Progress Notes (Signed)
 Hemodialysis Note:  Received patient in bed. Responds to voice. Informed consent singed and in chart.  Treatment initiated: 1430 Treatment completed: 1805  Access used: Right internal jugular catheter Access issues: None  Patient tolerated well. Report given to patient's RN.  Total UF removed: 2 liters Medications given: Albumin  25 gm IV  Post HD weight: 75.1 Kg  [facility]

## 2024-07-07 NOTE — Progress Notes (Signed)
 "  NAME:  [name], MRN:  [id number], DOB:  [birth year], LOS: 5 ADMISSION DATE:  07/01/2024, CHIEF COMPLAINT:  Syncope, V-Fib Arrest  History of Present Illness:  [name] is a 64 y.o. female with a pmh of of ESRD on HD (MWF), COPD, CHF, bipolar disorder, chronic hypotension on midodrine , HLD and prior CVA who presented to [facility] ED via EMS following syncopal episode.    History was gathered per chart review Per patient, her normal dialysis schedule is MWF.  Patient was recently admitted to the intensive care unit earlier this month July 03, 2024.  At that time she was treated for acute on chronic hypoxemia with circulatory shock since then she has been discharged home and while at home was found down by her family and was brought into the hospital.  After receiving medical management on the mat on the floor and dialysis patient had episode of pulselessness with wide complex tachyarrhythmia.  Rapid response was initiated and ACLS was started on patient.  She received 2 rounds of epinephrine  as well as shock with return of spontaneous circulation.  1 dose of IV amiodarone  was delivered and cardiology was contacted with initiation of amiodarone  infusion.  Patient was brought into the ICU on ventilator and intubated by EDP during CODE BLUE.  Pertinent  Medical History  07/02/24-transferred to ICU post CODE BLUE on medical floor status post ACLS with ROSC  07/03/24- overnight, unresposive with AF. MWF(HD). Will meet with family. Will continue to monitor and re-evaluate post dialysis.  07/04/24- patient had few episodes of VT. She is DNR code status.  She was seen by cardiology, she has poor prognosis. No procedures are planned.  07/05/24- patient had hypotensive episode during HD so it was interuppted.  The lidocaine  infusion will be dcd today and amiodarone  will continue.  Plan for paracentesis today. 07/06/24- patient with no events overnight, did have a non sustained run of VT similar to  previous.  Performing awakening trial today.  Due to staph/strep bacteremia the tunnelled vascCath has to come out.      Significant Hospital Events: Including procedures, antibiotic start and stop dates in addition to other pertinent events   Hypotension & Hypertension HLD HFrEF ESRD on HD Stroke CAD s/p CABG x 3 RBBB Pulmonary hypertension PVD Ischemic cardiomyopathy Hyperkalemia Hypothyroidism Atrial fibrillation on eliquis  Anemia of chronic kidney disease  Interim History / Subjective:  Patient remains intubated and ventilated, unresponsive off sedation  Objective    Blood pressure 117/62, pulse (!) 49, temperature 98.3 F (36.8 C), temperature source Axillary, resp. rate (!) 27, height 5' 6 (1.676 m), weight 75.2 kg, SpO2 94%.    Vent Mode: CPAP;PSV FiO2 (%):  [28 %] 28 % Set Rate:  [16 bmp] 16 bmp Vt Set:  [450 mL] 450 mL PEEP:  [5 cmH20] 5 cmH20 Pressure Support:  [8 cmH20-10 cmH20] 8 cmH20   Intake/Output Summary (Last 24 hours) at 07/07/2024 0908 Last data filed at 07/07/2024 0700 Gross per 24 hour  Intake 1577.92 ml  Output --  Net 1577.92 ml   Filed Weights   07/05/24 1115 07/06/24 0500 07/07/24 0500  Weight: 75.1 kg 75.8 kg 75.2 kg    Examination: Physical Exam Constitutional:      General: She is not in acute distress.    Appearance: She is ill-appearing.  HENT:     Mouth/Throat:     Comments: ETT in place Cardiovascular:     Rate and Rhythm: Normal rate and regular rhythm.  Pulses: Normal pulses.     Heart sounds: Normal heart sounds.  Pulmonary:     Comments: Ventilated breath sounds bilaterally Neurological:     Mental Status: She is disoriented.      Assessment and Plan   #Cardiac Arrest  #Ventricular Fibrillation #CAD  #Ischemic Cardiomyopathy #Septic Shock  #Gram Positive Bacteremia #COPD #Afib on Eliquis  #ESRD on HD #HyperKalemia  Neurology - remains encephalopathic off sedation. Continue to hold all sedation, and  will obtain brain imaging to further evaluate the etiology behind her encephalopathy.  Cardiovascular - V. Fib arrest for which she was intubated and started on amiodarone  (and previously received lidocaine ). She has atrial fibrillation as well as HFrEF (LVEF 25% with global hypokinesis) and a dysfunctional and enlarged right ventricle. Appreciate input from cardiology and EP, we will be starting metoprolol  today. She's also weaning off vasopressors with goal MAP > 65 mmHg. Checking coox tonight; patient will need heart failure consult if mental status recovers.   -PO amiodarone  tomorrow  -start PO metoprolol   -goal MAP > 65 mmHg  -continue IV heparin   Pulmonary - respiratory failure in the setting of cardiac arrest, now intubated and ventilated with minimal ventilator settings. Mental status remains barrier to extubation.  Gastrointestinal - s/p paracentesis (3.5 liters), on tube feeds.  Renal - ESRD on HD through a tunneled dialysis catheter. With concern for bacteremia this will need to be explanted and an HD catheter placed, vascular surgery consulted.   Endocrine - ICU glycemic protocol.  Hem/Onc - heparin  gtt given Afib.  ID - gram positive bacteremia with staph gordonii as well as MRSA. Tunneled catheter will need to be explained, and a new HD catheter placed. A set of repeat cultures was sent and remains negative. Now on antibiotics (CTX and Vancomycin ), echo ordered, and ID consulted.   Labs   CBC: Recent Labs  Lab 07/01/24 1336 07/02/24 0526 07/03/24 0543 07/04/24 0441 07/05/24 0439 07/06/24 0407 07/07/24 0412  WBC 12.0*   < > 14.7* 13.9* 13.7* 15.5* 17.1*  NEUTROABS 10.4*  --   --   --   --   --   --   HGB 12.7   < > 12.4 12.1 11.2* 11.7* 11.5*  HCT 38.7   < > 37.9 35.8* 32.6* 33.7* 32.1*  MCV 97.0   < > 96.2 93.0 89.8 89.4 86.5  PLT 82*   < > 63* 73* 68* 78* 84*   < > = values in this interval not displayed.    Basic Metabolic Panel: Recent Labs  Lab  07/03/24 0543 07/03/24 1847 07/04/24 0441 07/04/24 0727 07/05/24 0439 07/06/24 0407 07/07/24 0412  NA 134*  --  134*  --  131* 130* 129*  K 4.5   < > 3.9 3.9 3.7 4.0 4.1  CL 90*  --  92*  --  91* 90* 89*  CO2 23  --  25  --  25 26 26   GLUCOSE 169*  --  115*  --  79 96 108*  BUN 26*  --  21  --  36* 29* 58*  CREATININE 4.09*  --  3.18*  --  3.97* 2.83* 3.63*  CALCIUM  10.2  --  9.8  --  10.2 10.0 10.9*  MG  --    < > 1.9 1.9 2.4 2.1 2.1  PHOS  --   --  2.7  --  2.8 2.8 3.0   < > = values in this interval not displayed.   GFR: Estimated Creatinine  Clearance: 16.9 mL/min (A) (by C-G formula based on SCr of 3.63 mg/dL (H)). Recent Labs  Lab 07/01/24 1336 07/02/24 0526 07/02/24 1647 07/02/24 2016 07/03/24 0543 07/04/24 0441 07/05/24 0439 07/06/24 0407 07/07/24 0412  WBC 12.0*   < >  --   --    < > 13.9* 13.7* 15.5* 17.1*  LATICACIDVEN 1.9  --  5.4* 3.7*  --   --   --   --   --    < > = values in this interval not displayed.    Liver Function Tests: Recent Labs  Lab 07/01/24 1336 07/01/24 2155 07/02/24 0526 07/02/24 1646 07/04/24 0441 07/05/24 0439 07/06/24 0407 07/07/24 0412  AST 36 36 37 54*  --   --   --   --   ALT 15 15 14 19   --   --   --   --   ALKPHOS 71 68 72 81  --   --   --   --   BILITOT 1.2 1.2 1.3* 1.4*  --   --   --   --   PROT 7.2 6.8 6.9 6.6  --   --   --   --   ALBUMIN  3.8 3.5 3.5 3.5 3.1* 2.9* 3.0* 2.8*   No results for input(s): LIPASE, AMYLASE in the last 168 hours. Recent Labs  Lab 07/01/24 1726  AMMONIA 15    ABG    Component Value Date/Time   PHART 7.5 (H) 07/02/2024 2058   PCO2ART 33 07/02/2024 2058   PO2ART 202 (H) 07/02/2024 2058   HCO3 25.7 07/02/2024 2058   TCO2 26 04/20/2024 0708   ACIDBASEDEF 0.2 07/01/2024 2155   O2SAT 99.2 07/02/2024 2058     Coagulation Profile: Recent Labs  Lab 07/05/24 0820  INR 1.2    Cardiac Enzymes: No results for input(s): CKTOTAL, CKMB, CKMBINDEX, TROPONINI in the last 168  hours.  HbA1C: No results found for: HGBA1C  CBG: Recent Labs  Lab 07/06/24 1601 07/06/24 1928 07/06/24 2314 07/07/24 0309 07/07/24 0740  GLUCAP 122* 116* 100* 110* 106*    Review of Systems:   N/A  Past Medical History:  She,  has a past medical history of Acute lacunar infarction (HCC) (03/03/2014), Adult behavior problems, Anemia of chronic renal failure, Aortic atherosclerosis, Arthritis, Atrial fibrillation and flutter (HCC), Bipolar disorder (HCC), Cardiomegaly, Cerebral microvascular disease, CHF (congestive heart failure) (HCC), Chicken pox, Cholelithiasis, Chronic respiratory failure with hypoxia (HCC) (12/19/2021), COPD (chronic obstructive pulmonary disease) (HCC), Coronary artery disease, DDD (degenerative disc disease), cervical, DDD (degenerative disc disease), thoracolumbar, Diverticulosis, Dyspnea, ESRD (end stage renal disease) on dialysis (M,W,F), GERD (gastroesophageal reflux disease), Gout, HCV (hepatitis C virus), Headache, HFrEF (heart failure with reduced ejection fraction) (HCC), History of delirium (04/12/2014), History of substance abuse (HCC), Hyperkalemia (11/13/2014), Hyperlipidemia, Hypertension, Hypotension (02/24/2014), Hypothyroidism, Ischemic cardiomyopathy, Nose colonized with MRSA (02/01/2024), NSTEMI (non-ST elevated myocardial infarction) (HCC) (10/08/2013), NSTEMI (non-ST elevated myocardial infarction) (HCC) (01/23/2002), Obesity, 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, On apixaban  therapy, Peripheral vascular disease, Pneumonia, Postoperative anemia due to acute blood loss (02/27/2014), Postoperative cerebrovascular infarction following cardiac surgery (02/21/2014), Pulmonary hypertension (HCC), RBBB (right bundle branch block), Renal osteodystrophy, S/P CABG x 3 (02/20/2014), Stroke (HCC) (05/23/2010), and Syncope and collapse (07/25/2020).   Surgical History:   Past Surgical History:  Procedure Laterality Date   A/V FISTULAGRAM Left  03/30/2019   Procedure: A/V FISTULAGRAM;  Surgeon: Marea Selinda RAMAN, MD;  Location: [facility];  Service: Cardiovascular;  Laterality: Left;   A/V FISTULAGRAM Left 10/06/2019   Procedure: A/V FISTULAGRAM;  Surgeon: Marea Selinda RAMAN, MD;  Location: [facility];  Service: Cardiovascular;  Laterality: Left;   A/V FISTULAGRAM Left 05/19/2021   Procedure: A/V FISTULAGRAM;  Surgeon: Marea Selinda RAMAN, MD;  Location: [facility];  Service: Cardiovascular;  Laterality: Left;   A/V FISTULAGRAM Left 01/29/2022   Procedure: A/V Fistulagram;  Surgeon: Marea Selinda RAMAN, MD;  Location: [facility];  Service: Cardiovascular;  Laterality: Left;   A/V FISTULAGRAM Left 04/30/2022   Procedure: A/V Fistulagram;  Surgeon: Marea Selinda RAMAN, MD;  Location: [facility];  Service: Cardiovascular;  Laterality: Left;   A/V FISTULAGRAM Left 08/20/2022   Procedure: A/V Fistulagram;  Surgeon: Marea Selinda RAMAN, MD;  Location: [facility];  Service: Cardiovascular;  Laterality: Left;   A/V FISTULAGRAM N/A 12/03/2022   Procedure: A/V Fistulagram;  Surgeon: Marea Selinda RAMAN, MD;  Location: [facility];  Service: Cardiovascular;  Laterality: N/A;   A/V FISTULAGRAM Left 02/11/2023   Procedure: A/V Fistulagram;  Surgeon: Marea Selinda RAMAN, MD;  Location: [facility];  Service: Cardiovascular;  Laterality: Left;   A/V FISTULAGRAM Left 02/07/2024   Procedure: A/V Fistulagram;  Surgeon: Marea Selinda RAMAN, MD;  Location: [facility];  Service: Cardiovascular;  Laterality: Left;   COLONOSCOPY     COLONOSCOPY WITH PROPOFOL  N/A 04/22/2021   Procedure: COLONOSCOPY WITH PROPOFOL ;  Surgeon: Maryruth Ole DASEN, MD;  Location: [facility];  Service: Endoscopy;  Laterality: N/A;   CORONARY ANGIOPLASTY WITH STENT PLACEMENT Left 01/25/2002   Procedure: CORONARY ANGIOPLASTY WITH STENT PLACEMENT; Location: [facility]; Surgeon: Margie Lovelace, MD   CORONARY ARTERY BYPASS GRAFT N/A 02/20/2014   Procedure:  CORONARY ARTERY BYPASS GRAFT; Location: [facility]; Surgeon: Reyes Fruits, MD   DIALYSIS FISTULA CREATION     DIALYSIS/PERMA CATHETER INSERTION N/A 04/08/2023   Procedure: DIALYSIS/PERMA CATHETER INSERTION;  Surgeon: Marea Selinda RAMAN, MD;  Location: [facility];  Service: Cardiovascular;  Laterality: N/A;   DIALYSIS/PERMA CATHETER REMOVAL N/A 07/06/2023   Procedure: DIALYSIS/PERMA CATHETER REMOVAL;  Surgeon: [name] Cordella MATSU, MD;  Location: [facility];  Service: Cardiovascular;  Laterality: N/A;   ESOPHAGOGASTRODUODENOSCOPY     ESOPHAGOGASTRODUODENOSCOPY N/A 04/22/2021   Procedure: ESOPHAGOGASTRODUODENOSCOPY (EGD);  Surgeon: Maryruth Ole DASEN, MD;  Location: [facility];  Service: Endoscopy;  Laterality: N/A;   FLEXIBLE SIGMOIDOSCOPY N/A 02/23/2019   Procedure: FLEXIBLE SIGMOIDOSCOPY;  Surgeon: Gaylyn Gladis PENNER, MD;  Location: [facility] ENDOSCOPY;  Service: Endoscopy;  Laterality: N/A;   IABP INSERTION  02/20/2014   Procedure: INTRA-AORTIC BALLOON PUMP INSERTION; Location: [facility]; Surgeon: Reyes Fruits, MD   INSERTION OF ARTERIOVENOUS (AV) ARTEGRAFT ARM Right 03/09/2024   Procedure: INSERTION, GRAFT, ARTERIOVENOUS, UPPER EXTREMITY;  Surgeon: Marea Selinda RAMAN, MD;  Location: [facility];  Service: Vascular;  Laterality: Right;  BRACHIAL AXILLARY   INSERTION OF DIALYSIS CATHETER N/A 04/20/2024   Procedure: INSERTION OF DIALYSIS CATHETER;  Surgeon: Marea Selinda RAMAN, MD;  Location: [facility];  Service: Vascular;  Laterality: N/A;   LEFT HEART CATH AND CORONARY ANGIOGRAPHY Left 01/24/2002   Procedure: LEFT HEART CATH AND CORONARY ANGIOGRAPHY; Location: [facility]; Surgeon: Margie Lovelace, MD   LEFT HEART CATH AND CORONARY ANGIOGRAPHY Left 05/25/2012   Procedure: LEFT HEART CATH AND CORONARY ANGIOGRAPHY; Location: [facility]; Surgeon: Margie Lovelace, MD   LEFT HEART CATH AND CORONARY ANGIOGRAPHY Left 10/09/2013   Procedure: LEFT HEART CATH AND CORONARY ANGIOGRAPHY; Location: [facility]; Surgeon: Vinie  Bosie, MD    LEFT HEART CATH AND CORS/GRAFTS ANGIOGRAPHY N/A 08/08/2018   Procedure: LEFT HEART CATH AND CORS/GRAFTS ANGIOGRAPHY;  Surgeon: Fernand Denyse LABOR, MD;  Location: [facility];  Service: Cardiovascular;  Laterality: N/A;   LEFT HEART CATH AND CORS/GRAFTS ANGIOGRAPHY N/A 01/30/2019   Procedure: LEFT HEART CATH AND CORS/GRAFTS ANGIOGRAPHY;  Surgeon: Fernand Denyse LABOR, MD;  Location: [facility];  Service: Cardiovascular;  Laterality: N/A;   LIGATION ARTERIOVENOUS GORTEX GRAFT Right 04/20/2024   Procedure: LIGATION ARTERIOVENOUS GORTEX GRAFT;  Surgeon: Marea Selinda RAMAN, MD;  Location: [facility];  Service: Vascular;  Laterality: Right;   LOWER EXTREMITY ANGIOGRAPHY Left 04/24/2024   Procedure: Lower Extremity Angiography;  Surgeon: Marea Selinda RAMAN, MD;  Location: [facility];  Service: Cardiovascular;  Laterality: Left;   ultrasound guided pericardiocentesis     UPPER EXTREMITY ANGIOGRAPHY Right 03/30/2024   Procedure: Upper Extremity Angiography;  Surgeon: Marea Selinda RAMAN, MD;  Location: [facility];  Service: Cardiovascular;  Laterality: Right;     Social History:   reports that she has been smoking cigarettes. She has a 5 pack-year smoking history. She has never used smokeless tobacco. She reports that she does not drink alcohol and does not use drugs.   Family History:  Her family history includes Breast cancer in her maternal aunt and maternal grandmother; Cancer in an other family member; Diabetes type II in her maternal grandmother and mother; Hypertension in her father, mother, sister, and another family member; Kidney disease in her father; Ovarian cancer in her mother; Renal Disease in an other family member.   Allergies Allergies[1]   Home Medications  Prior to Admission medications  Medication Sig Start Date End Date Taking? Authorizing Provider  albuterol  (VENTOLIN  HFA) 108 (90 Base) MCG/ACT inhaler Inhale 1-2 puffs into the lungs every 6 (six) hours as needed for  wheezing or shortness of breath. 01/04/24  Yes Orlean Alan HERO, FNP  apixaban  (ELIQUIS ) 5 MG TABS tablet Take 5 mg by mouth 2 (two) times daily. 09/11/19  Yes [provider]  clobetasol cream (TEMOVATE) 0.05 % Apply 1 Application topically daily. 06/16/24  Yes [provider]  hydrOXYzine  (ATARAX /VISTARIL ) 50 MG tablet Take 50 mg by mouth 2 (two) times daily.   Yes [provider]  levothyroxine  (SYNTHROID ) 75 MCG tablet Take 75 mcg by mouth daily before breakfast.  11/02/18  Yes [provider]  lidocaine -prilocaine  (EMLA ) cream Apply 1 Application topically once. 01/22/21  Yes [provider]  [Paused] metoprolol  succinate (TOPROL -XL) 25 MG 24 hr tablet Take 1 tablet by mouth every morning. Wait to take this until your doctor or other care provider tells you to start again. 08/27/23 08/26/24 Yes [provider]  midodrine  (PROAMATINE ) 10 MG tablet Take 1 tablet (10 mg total) by mouth 3 (three) times daily. 06/27/24  Yes Ponnala, Shruthi, MD  oxyCODONE -acetaminophen  (PERCOCET) 5-325 MG tablet Take 1 tablet by mouth every 6 (six) hours as needed for severe pain (pain score 7-10). 06/27/24  Yes Ponnala, Shruthi, MD  pantoprazole  (PROTONIX ) 40 MG tablet Take 1 tablet (40 mg total) by mouth every morning. 06/27/24  Yes Ponnala, Shruthi, MD  pregabalin (LYRICA) 50 MG capsule Take 50 mg by mouth daily. Patient not taking: Reported on 06/23/2024    [provider]  ranolazine  (RANEXA ) 1000 MG SR tablet Take 1,000 mg by mouth 2 (two) times daily. Patient not taking: Reported on 06/23/2024 03/18/23   [provider]  rosuvastatin  (  CRESTOR ) 20 MG tablet Take 20 mg by mouth at bedtime. Patient not taking: Reported on 06/23/2024 11/07/21   [provider]  sevelamer  carbonate (RENVELA ) 800 MG tablet Take 3,200 mg by mouth 3 (three) times daily with meals. Patient not taking: Reported on 06/23/2024    [provider]  venlafaxine   (EFFEXOR ) 37.5 MG tablet Take 37.5 mg by mouth 2 (two) times daily with a meal. Patient not taking: Reported on 06/23/2024    [provider]     The patient is critically ill due to septic shock, MRSA bacteremia, biventricular failure, V. Fib, respiratory failure, ESRD.  Critical care was necessary to treat or prevent imminent or life-threatening deterioration. I personally performed high risk medication and infusion titration and management, titration, monitoring, and management of vasopressor/ionotrope infusion, and mechanical ventilation management and titration. Critical care time was spent by me on the following activities: development of a treatment plan with the patient and/or surrogate as well as nursing, discussions with consultants, evaluation of the patient's response to treatment, examination of the patient, obtaining a history from the patient or surrogate, ordering and performing treatments and interventions, ordering and review of laboratory studies, ordering and review of radiographic studies, review of telemetry data including pulse oximetry, re-evaluation of patient's condition and participation in multidisciplinary rounds.   I personally spent 40 minutes providing critical care not including any separately billable procedures.   Belva November, MD [facility] 07/07/2024 8:31 PM        [1]  Allergies Allergen Reactions   Morphine  And Codeine Hives   Levaquin [Levofloxacin] Itching and Other (See Comments)    Severe itching; prickly sensation; severe left leg pain; immobility.   Enalapril  Other (See Comments)    hallucinations   Latex Rash   Tape Rash and Other (See Comments)   "

## 2024-07-07 NOTE — Plan of Care (Signed)
" °  Problem: Education: Goal: Knowledge of General Education information will improve Description: Including pain rating scale, medication(s)/side effects and non-pharmacologic comfort measures Outcome: Not Progressing   Problem: Health Behavior/Discharge Planning: Goal: Ability to manage health-related needs will improve Outcome: Not Progressing   Problem: Clinical Measurements: Goal: Diagnostic test results will improve Outcome: Progressing Goal: Respiratory complications will improve Outcome: Progressing Goal: Cardiovascular complication will be avoided Outcome: Progressing   Problem: Nutrition: Goal: Adequate nutrition will be maintained Outcome: Progressing   Problem: Coping: Goal: Level of anxiety will decrease Outcome: Progressing   Problem: Elimination: Goal: Will not experience complications related to bowel motility Outcome: Progressing   "

## 2024-07-07 NOTE — Progress Notes (Signed)
 " [facility]  PROGRESS NOTE   Subjective:   Patient seen at bedside.  Her oldest daughter is with her at this time.  Patient remains critically ill, intubated and sedated.  Irregular rhythm noted Off pressors as well as sedation. Low-dose tube feeds continued.   Patient opens eyes to voice does not follow commands. Heparin  drip, amiodarone  drip Tube feeds Ventilator assisted, FiO2 28%, PEEP 5 She appears to have developed dependent edema over her arms as well as leg.   Objective:  Vital signs: Blood pressure (!) 112/53, pulse 61, temperature 98.3 F (36.8 C), temperature source Axillary, resp. rate (!) 28, height 5' 6 (1.676 m), weight 75.2 kg, SpO2 94%.  Intake/Output Summary (Last 24 hours) at 07/07/2024 1246 Last data filed at 07/07/2024 1100 Gross per 24 hour  Intake 1812.59 ml  Output --  Net 1812.59 ml   Filed Weights   07/05/24 1115 07/06/24 0500 07/07/24 0500  Weight: 75.1 kg 75.8 kg 75.2 kg     Physical Exam: General:  No acute distress  Head:  Normocephalic, atraumatic.  ET tube, OG tube in place  Eyes:  Anicteric  Lungs:   Clear to auscultation, ventilator assisted  Heart:  S1S2 no rubs  Abdomen:   Soft, distended, hypoactive bowel sounds  Extremities:  + peripheral edema.  Neurologic: Opens eyes to voice but not following commands.  Skin:  Left foot wound  Access: Right IJ dialysis catheter    Basic Metabolic Panel: Recent Labs  Lab 07/03/24 0543 07/03/24 1847 07/04/24 0441 07/04/24 0727 07/05/24 0439 07/06/24 0407 07/07/24 0412  NA 134*  --  134*  --  131* 130* 129*  K 4.5   < > 3.9 3.9 3.7 4.0 4.1  CL 90*  --  92*  --  91* 90* 89*  CO2 23  --  25  --  25 26 26   GLUCOSE 169*  --  115*  --  79 96 108*  BUN 26*  --  21  --  36* 29* 58*  CREATININE 4.09*  --  3.18*  --  3.97* 2.83* 3.63*  CALCIUM  10.2  --  9.8  --  10.2 10.0 10.9*  MG  --    < > 1.9 1.9 2.4 2.1 2.1  PHOS  --   --  2.7  --  2.8 2.8 3.0   < > = values in this  interval not displayed.   GFR: Estimated Creatinine Clearance: 16.9 mL/min (A) (by C-G formula based on SCr of 3.63 mg/dL (H)).  Liver Function Tests: Recent Labs  Lab 07/01/24 1336 07/01/24 2155 07/02/24 0526 07/02/24 1646 07/04/24 0441 07/05/24 0439 07/06/24 0407 07/07/24 0412  AST 36 36 37 54*  --   --   --   --   ALT 15 15 14 19   --   --   --   --   ALKPHOS 71 68 72 81  --   --   --   --   BILITOT 1.2 1.2 1.3* 1.4*  --   --   --   --   PROT 7.2 6.8 6.9 6.6  --   --   --   --   ALBUMIN  3.8 3.5 3.5 3.5 3.1* 2.9* 3.0* 2.8*   No results for input(s): LIPASE, AMYLASE in the last 168 hours. Recent Labs  Lab 07/01/24 1726  AMMONIA 15    CBC: Recent Labs  Lab 07/01/24 1336 07/02/24 0526 07/03/24 0543 07/04/24 0441 07/05/24 0439 07/06/24  0407 07/07/24 0412  WBC 12.0*   < > 14.7* 13.9* 13.7* 15.5* 17.1*  NEUTROABS 10.4*  --   --   --   --   --   --   HGB 12.7   < > 12.4 12.1 11.2* 11.7* 11.5*  HCT 38.7   < > 37.9 35.8* 32.6* 33.7* 32.1*  MCV 97.0   < > 96.2 93.0 89.8 89.4 86.5  PLT 82*   < > 63* 73* 68* 78* 84*   < > = values in this interval not displayed.     HbA1C: No results found for: HGBA1C  Urinalysis: No results for input(s): COLORURINE, LABSPEC, PHURINE, GLUCOSEU, HGBUR, BILIRUBINUR, KETONESUR, PROTEINUR, UROBILINOGEN, NITRITE, LEUKOCYTESUR in the last 72 hours.  Invalid input(s): APPERANCEUR    Imaging: No results found.    Medications:    albumin  human     amiodarone  30 mg/hr (07/07/24 1100)   cefTRIAXone  (ROCEPHIN )  IV Stopped (07/06/24 2020)   heparin  1,450 Units/hr (07/07/24 1100)   norepinephrine  (LEVOPHED ) Adult infusion Stopped (07/04/24 2343)   propofol  (DIPRIVAN ) infusion Stopped (07/06/24 9191)   vancomycin      vasopressin  Stopped (07/03/24 2206)    Chlorhexidine  Gluconate Cloth  6 each Topical Daily   collagenase   1 Application Topical Daily   feeding supplement (PROSource TF20)  60 mL Per Tube  Daily   feeding supplement (VITAL AF 1.2 CAL)  1,000 mL Per Tube Q24H   free water   30 mL Per Tube Q4H   levothyroxine   75 mcg Oral Q0600   metoprolol  tartrate  12.5 mg Oral BID   midodrine   10 mg Oral TID WC   multivitamin  1 tablet Per Tube QHS   nutrition supplement (JUVEN)  1 packet Per Tube BID BM   mouth rinse  15 mL Mouth Rinse Q2H   pantoprazole  (PROTONIX ) IV  40 mg Intravenous Daily   sodium chloride  flush  10-40 mL Intracatheter Q12H   venlafaxine   37.5 mg Oral BID WC    Assessment/ Plan:     64 year old female with history of coronary disease history of CABG, ESRD, COPD, atrial fibrillation on hemodialysis Monday Wednesday and Friday now admitted with history of syncope/fall.  In hospital cardiac arrest on Sunday, 07/02/2024 Outpatient dialysis: [facility]/MWF/right PermCath/72.5 kg    ESRD: HD Monday Wednesday and Friday.  Off pressors now.  Tolerating intermittent hemodialysis.  Continue MWF schedule.  Goal UF 2 L  with iv albumin  support.   ANEMIA of chronic kidney disease: Hemoglobin acceptable at present.   Will continue to monitor closely.   MBD (secondary hyperparathyroidism): Will monitor calcium  and phosphorus levels.  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  Will continue sodium thiosulfate  when she stabilizes.   Hypotension/VOL/edema: Off pressors now.  Continues to have A-fib with RVR.  Cardiology team following along for chronic systolic CHF.  IV amiodarone  drip.  Acute respiratory failure-requiring ventilator postcardiac arrest on 07/02/2024.  FiO2 28%.    Labs and medications reviewed. Will continue to follow along with you.   LOS: 5 Vanessa Stank, MD [facility] 1/23/202612:46 PM  "

## 2024-07-07 NOTE — Progress Notes (Signed)
 Patient well-known to our service who we have seen many times over the past several months.  Has bacteremia and we are asked to remove her PermCath.  She is intubated and sedated after a V-fib arrest earlier on this admission.  Her brother is at bedside and has known her medical issues well for many years.  I explained to him that she will have to have her PermCath removed.  They have just initiated dialysis unfortunately and are using the PermCath.  This will run for several hours.  Will ask the on-call physician about potentially removing this catheter tomorrow morning.

## 2024-07-08 DIAGNOSIS — I2510 Atherosclerotic heart disease of native coronary artery without angina pectoris: Secondary | ICD-10-CM | POA: Diagnosis not present

## 2024-07-08 DIAGNOSIS — I469 Cardiac arrest, cause unspecified: Secondary | ICD-10-CM | POA: Diagnosis not present

## 2024-07-08 DIAGNOSIS — A419 Sepsis, unspecified organism: Secondary | ICD-10-CM | POA: Diagnosis not present

## 2024-07-08 DIAGNOSIS — R6521 Severe sepsis with septic shock: Secondary | ICD-10-CM | POA: Diagnosis not present

## 2024-07-08 LAB — BODY FLUID CULTURE W GRAM STAIN: Culture: NO GROWTH

## 2024-07-08 LAB — GLUCOSE, CAPILLARY
Glucose-Capillary: 102 mg/dL — ABNORMAL HIGH (ref 70–99)
Glucose-Capillary: 106 mg/dL — ABNORMAL HIGH (ref 70–99)
Glucose-Capillary: 117 mg/dL — ABNORMAL HIGH (ref 70–99)
Glucose-Capillary: 127 mg/dL — ABNORMAL HIGH (ref 70–99)
Glucose-Capillary: 99 mg/dL (ref 70–99)

## 2024-07-08 LAB — BASIC METABOLIC PANEL WITH GFR
Anion gap: 15 (ref 5–15)
BUN: 48 mg/dL — ABNORMAL HIGH (ref 8–23)
CO2: 26 mmol/L (ref 22–32)
Calcium: 9.9 mg/dL (ref 8.9–10.3)
Chloride: 89 mmol/L — ABNORMAL LOW (ref 98–111)
Creatinine, Ser: 2.51 mg/dL — ABNORMAL HIGH (ref 0.44–1.00)
GFR, Estimated: 21 mL/min — ABNORMAL LOW
Glucose, Bld: 97 mg/dL (ref 70–99)
Potassium: 3.7 mmol/L (ref 3.5–5.1)
Sodium: 130 mmol/L — ABNORMAL LOW (ref 135–145)

## 2024-07-08 LAB — PHOSPHORUS: Phosphorus: 2.2 mg/dL — ABNORMAL LOW (ref 2.5–4.6)

## 2024-07-08 LAB — MAGNESIUM: Magnesium: 2.1 mg/dL (ref 1.7–2.4)

## 2024-07-08 LAB — HEPARIN LEVEL (UNFRACTIONATED): Heparin Unfractionated: 0.39 [IU]/mL (ref 0.30–0.70)

## 2024-07-08 MED ORDER — LIDOCAINE HCL 1 % IJ SOLN
INTRAMUSCULAR | Status: AC
  Filled 2024-07-08: qty 10

## 2024-07-08 MED ORDER — AMIODARONE HCL 200 MG PO TABS
200.0000 mg | ORAL_TABLET | Freq: Three times a day (TID) | ORAL | Status: AC
  Administered 2024-07-08 – 2024-07-17 (×27): 200 mg
  Filled 2024-07-08 (×26): qty 1

## 2024-07-08 MED ORDER — LEVOTHYROXINE SODIUM 50 MCG PO TABS
75.0000 ug | ORAL_TABLET | Freq: Every day | ORAL | Status: DC
  Administered 2024-07-09 – 2024-07-17 (×9): 75 ug
  Filled 2024-07-08: qty 2
  Filled 2024-07-08 (×2): qty 1
  Filled 2024-07-08 (×2): qty 2
  Filled 2024-07-08: qty 1
  Filled 2024-07-08 (×2): qty 2
  Filled 2024-07-08: qty 1

## 2024-07-08 MED ORDER — AMIODARONE HCL 200 MG PO TABS
200.0000 mg | ORAL_TABLET | Freq: Three times a day (TID) | ORAL | Status: DC
  Filled 2024-07-08: qty 1

## 2024-07-08 MED ORDER — MIDODRINE HCL 5 MG PO TABS
10.0000 mg | ORAL_TABLET | Freq: Three times a day (TID) | ORAL | Status: DC
  Administered 2024-07-08 – 2024-07-11 (×8): 10 mg
  Filled 2024-07-08 (×8): qty 2

## 2024-07-08 MED ORDER — VASOPRESSIN 20 UNITS/100 ML INFUSION FOR SHOCK
0.0000 [IU]/min | INTRAVENOUS | Status: DC
  Administered 2024-07-08 – 2024-07-09 (×2): 0.03 [IU]/min via INTRAVENOUS
  Administered 2024-07-10: 0.02 [IU]/min via INTRAVENOUS
  Filled 2024-07-08 (×2): qty 100

## 2024-07-08 MED ORDER — K PHOS MONO-SOD PHOS DI & MONO 155-852-130 MG PO TABS
500.0000 mg | ORAL_TABLET | Freq: Once | ORAL | Status: AC
  Administered 2024-07-08: 500 mg
  Filled 2024-07-08: qty 2

## 2024-07-08 MED ORDER — METOPROLOL TARTRATE 25 MG PO TABS
12.5000 mg | ORAL_TABLET | Freq: Two times a day (BID) | ORAL | Status: DC
  Administered 2024-07-09 – 2024-07-12 (×6): 12.5 mg
  Filled 2024-07-08 (×6): qty 1

## 2024-07-08 MED ORDER — VENLAFAXINE HCL 37.5 MG PO TABS
37.5000 mg | ORAL_TABLET | Freq: Two times a day (BID) | ORAL | Status: DC
  Administered 2024-07-08 – 2024-07-10 (×4): 37.5 mg
  Filled 2024-07-08 (×5): qty 1

## 2024-07-08 NOTE — Progress Notes (Signed)
 "  NAME:  RHIANON ZABAWA, MRN:  [id number], DOB:  Aug 14, 1960, LOS: 6 ADMISSION DATE:  07/01/2024, CHIEF COMPLAINT:  Syncope, V-Fib Arrest  History of Present Illness:  MARISOL GIAMBRA is a 64 y.o. female with a pmh of of ESRD on HD (MWF), COPD, CHF, bipolar disorder, chronic hypotension on midodrine , HLD and prior CVA who presented to [facility] ED via EMS following syncopal episode.    History was gathered per chart review Per patient, her normal dialysis schedule is MWF.  Patient was recently admitted to the intensive care unit earlier this month July 03, 2024.  At that time she was treated for acute on chronic hypoxemia with circulatory shock since then she has been discharged home and while at home was found down by her family and was brought into the hospital.  After receiving medical management on the mat on the floor and dialysis patient had episode of pulselessness with wide complex tachyarrhythmia.  Rapid response was initiated and ACLS was started on patient.  She received 2 rounds of epinephrine  as well as shock with return of spontaneous circulation.  1 dose of IV amiodarone  was delivered and cardiology was contacted with initiation of amiodarone  infusion.  Patient was brought into the ICU on ventilator and intubated by EDP during CODE BLUE.  Pertinent  Medical History  07/02/24-transferred to ICU post CODE BLUE on medical floor status post ACLS with ROSC  07/03/24- overnight, unresposive with AF. MWF(HD). Will meet with family. Will continue to monitor and re-evaluate post dialysis.  07/04/24- patient had few episodes of VT. She is DNR code status.  She was seen by cardiology, she has poor prognosis. No procedures are planned.  07/05/24- patient had hypotensive episode during HD so it was interuppted.  The lidocaine  infusion will be dcd today and amiodarone  will continue.  Plan for paracentesis today. 07/06/24- patient with no events overnight, did have a non sustained run of VT similar to  previous.  Performing awakening trial today.  Due to staph/strep bacteremia the tunnelled vascCath has to come out.  07/07/24 - started PO amiodarone , MRI brain unremarkable   Significant Hospital Events: Including procedures, antibiotic start and stop dates in addition to other pertinent events   Hypotension & Hypertension HLD HFrEF ESRD on HD Stroke CAD s/p CABG x 3 RBBB Pulmonary hypertension PVD Ischemic cardiomyopathy Hyperkalemia Hypothyroidism Atrial fibrillation on eliquis  Anemia of chronic kidney disease  Interim History / Subjective:  Remains intubated and sedated. More awake and interactive today, responding.  Objective    Blood pressure 99/63, pulse (!) 54, temperature 98.5 F (36.9 C), temperature source Oral, resp. rate 16, height 5' 6 (1.676 m), weight 75.3 kg, SpO2 98%.    Vent Mode: PRVC FiO2 (%):  [28 %] 28 % Set Rate:  [16 bmp] 16 bmp Vt Set:  [450 mL] 450 mL PEEP:  [5 cmH20] 5 cmH20 Plateau Pressure:  [15 cmH20-17 cmH20] 17 cmH20   Intake/Output Summary (Last 24 hours) at 07/08/2024 1803 Last data filed at 07/08/2024 1700 Gross per 24 hour  Intake 1367.11 ml  Output 2000 ml  Net -632.89 ml   Filed Weights   07/07/24 1415 07/07/24 1805 07/08/24 0435  Weight: 77.1 kg 75.1 kg 75.3 kg    Examination: Physical Exam Constitutional:      General: She is not in acute distress.    Appearance: She is ill-appearing.  HENT:     Mouth/Throat:     Comments: ETT in place Cardiovascular:  Rate and Rhythm: Normal rate and regular rhythm.     Pulses: Normal pulses.     Heart sounds: Normal heart sounds.  Pulmonary:     Comments: Ventilated breath sounds bilaterally Neurological:     Mental Status: She is disoriented.      Assessment and Plan   #Cardiac Arrest  #Ventricular Fibrillation #CAD  #Ischemic Cardiomyopathy #Septic Shock  #Gram Positive Bacteremia #COPD #Afib on Eliquis  #ESRD on HD #HyperKalemia  Neurology - remains  encephalopathic off sedation. Mental status mildly improved today with more arousability. Will continue to hold all sedation and work towards extubation.   -MRI brain without acute pathology  -continue to hold all psychotropic medications  Cardiovascular - V. Fib arrest for which she was intubated and started on amiodarone  (and previously received lidocaine ). She has atrial fibrillation as well as HFrEF (LVEF 25% with global hypokinesis) and a dysfunctional and enlarged right ventricle.   Amiodarone  switched to PO (200 tid) today, and metoprolol  started yesterday. Coox last night with O2 sat of 81% going against decompensated heart failure at the moment. Will need GDMT after extubation.   -Amiodarone  200 mg tid  -Metoprolol  12.5 mg bid  -goal MAP > 65 mmHg  -continue IV heparin   Pulmonary - respiratory failure in the setting of cardiac arrest, now intubated and ventilated with minimal ventilator settings. Mental status remains barrier to extubation. Continue to work towards liberation off the vent with daily SBT.  Gastrointestinal - s/p paracentesis (3.5 liters), on tube feeds.  Renal - ESRD on HD through a tunneled dialysis catheter. With concern for bacteremia this will need to be explanted and an HD catheter placed, vascular surgery consulted. Tunneled catheter was removed today, and we will maintain a line holiday over the weekend, with plan for new dialysis catheter Monday/Tuesday. Will discuss with vascular surgery and nephrology if a new tunneled catheter or a temporary catheter would be best to suite her needs moving forward.  Endocrine - ICU glycemic protocol.  Hem/Onc - heparin  gtt given Afib.  ID - gram positive bacteremia with staph gordonii as well as MRSA. Tunneled catheter explanted today. Repeat blood cultures remain negative. Continued on IV antibiotics. Appreciate input from ID.   Labs   CBC: Recent Labs  Lab 07/03/24 0543 07/04/24 0441 07/05/24 0439 07/06/24 0407  07/07/24 0412  WBC 14.7* 13.9* 13.7* 15.5* 17.1*  HGB 12.4 12.1 11.2* 11.7* 11.5*  HCT 37.9 35.8* 32.6* 33.7* 32.1*  MCV 96.2 93.0 89.8 89.4 86.5  PLT 63* 73* 68* 78* 84*    Basic Metabolic Panel: Recent Labs  Lab 07/04/24 0441 07/04/24 0727 07/05/24 0439 07/06/24 0407 07/07/24 0412 07/08/24 0428  NA 134*  --  131* 130* 129* 130*  K 3.9 3.9 3.7 4.0 4.1 3.7  CL 92*  --  91* 90* 89* 89*  CO2 25  --  25 26 26 26   GLUCOSE 115*  --  79 96 108* 97  BUN 21  --  36* 29* 58* 48*  CREATININE 3.18*  --  3.97* 2.83* 3.63* 2.51*  CALCIUM  9.8  --  10.2 10.0 10.9* 9.9  MG 1.9 1.9 2.4 2.1 2.1 2.1  PHOS 2.7  --  2.8 2.8 3.0 2.2*   GFR: Estimated Creatinine Clearance: 24.4 mL/min (A) (by C-G formula based on SCr of 2.51 mg/dL (H)). Recent Labs  Lab 07/02/24 1647 07/02/24 2016 07/03/24 0543 07/04/24 0441 07/05/24 0439 07/06/24 0407 07/07/24 0412  WBC  --   --    < >  13.9* 13.7* 15.5* 17.1*  LATICACIDVEN 5.4* 3.7*  --   --   --   --   --    < > = values in this interval not displayed.    Liver Function Tests: Recent Labs  Lab 07/01/24 2155 07/02/24 0526 07/02/24 1646 07/04/24 0441 07/05/24 0439 07/06/24 0407 07/07/24 0412  AST 36 37 54*  --   --   --   --   ALT 15 14 19   --   --   --   --   ALKPHOS 68 72 81  --   --   --   --   BILITOT 1.2 1.3* 1.4*  --   --   --   --   PROT 6.8 6.9 6.6  --   --   --   --   ALBUMIN  3.5 3.5 3.5 3.1* 2.9* 3.0* 2.8*   No results for input(s): LIPASE, AMYLASE in the last 168 hours. No results for input(s): AMMONIA in the last 168 hours.   ABG    Component Value Date/Time   PHART 7.5 (H) 07/02/2024 2058   PCO2ART 33 07/02/2024 2058   PO2ART 202 (H) 07/02/2024 2058   HCO3 25.7 07/02/2024 2058   TCO2 26 04/20/2024 0708   ACIDBASEDEF 0.2 07/01/2024 2155   O2SAT 81.8 07/07/2024 2029     Coagulation Profile: Recent Labs  Lab 07/05/24 0820  INR 1.2    Cardiac Enzymes: No results for input(s): CKTOTAL, CKMB,  CKMBINDEX, TROPONINI in the last 168 hours.  HbA1C: No results found for: HGBA1C  CBG: Recent Labs  Lab 07/07/24 1931 07/07/24 2326 07/08/24 0322 07/08/24 0758 07/08/24 1132  GLUCAP 110* 91 99 106* 127*    Review of Systems:   N/A  Past Medical History:  She,  has a past medical history of Acute lacunar infarction (HCC) (03/03/2014), Adult behavior problems, Anemia of chronic renal failure, Aortic atherosclerosis, Arthritis, Atrial fibrillation and flutter (HCC), Bipolar disorder (HCC), Cardiomegaly, Cerebral microvascular disease, CHF (congestive heart failure) (HCC), Chicken pox, Cholelithiasis, Chronic respiratory failure with hypoxia (HCC) (12/19/2021), COPD (chronic obstructive pulmonary disease) (HCC), Coronary artery disease, DDD (degenerative disc disease), cervical, DDD (degenerative disc disease), thoracolumbar, Diverticulosis, Dyspnea, ESRD (end stage renal disease) on dialysis (M,W,F), GERD (gastroesophageal reflux disease), Gout, HCV (hepatitis C virus), Headache, HFrEF (heart failure with reduced ejection fraction) (HCC), History of delirium (04/12/2014), History of substance abuse (HCC), Hyperkalemia (11/13/2014), Hyperlipidemia, Hypertension, Hypotension (02/24/2014), Hypothyroidism, Ischemic cardiomyopathy, Nose colonized with MRSA (02/01/2024), NSTEMI (non-ST elevated myocardial infarction) (HCC) (10/08/2013), NSTEMI (non-ST elevated myocardial infarction) (HCC) (01/23/2002), Obesity, 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, On apixaban  therapy, Peripheral vascular disease, Pneumonia, Postoperative anemia due to acute blood loss (02/27/2014), Postoperative cerebrovascular infarction following cardiac surgery (02/21/2014), Pulmonary hypertension (HCC), RBBB (right bundle branch block), Renal osteodystrophy, S/P CABG x 3 (02/20/2014), Stroke (HCC) (05/23/2010), and Syncope and collapse (07/25/2020).   Surgical History:   Past Surgical History:  Procedure  Laterality Date   A/V FISTULAGRAM Left 03/30/2019   Procedure: A/V FISTULAGRAM;  Surgeon: Marea Selinda RAMAN, MD;  Location: [facility];  Service: Cardiovascular;  Laterality: Left;   A/V FISTULAGRAM Left 10/06/2019   Procedure: A/V FISTULAGRAM;  Surgeon: Marea Selinda RAMAN, MD;  Location: [facility];  Service: Cardiovascular;  Laterality: Left;   A/V FISTULAGRAM Left 05/19/2021   Procedure: A/V FISTULAGRAM;  Surgeon: Marea Selinda RAMAN, MD;  Location: [facility];  Service: Cardiovascular;  Laterality: Left;   A/V FISTULAGRAM Left  01/29/2022   Procedure: A/V Fistulagram;  Surgeon: Marea Selinda RAMAN, MD;  Location: [facility];  Service: Cardiovascular;  Laterality: Left;   A/V FISTULAGRAM Left 04/30/2022   Procedure: A/V Fistulagram;  Surgeon: Marea Selinda RAMAN, MD;  Location: [facility];  Service: Cardiovascular;  Laterality: Left;   A/V FISTULAGRAM Left 08/20/2022   Procedure: A/V Fistulagram;  Surgeon: Marea Selinda RAMAN, MD;  Location: [facility];  Service: Cardiovascular;  Laterality: Left;   A/V FISTULAGRAM N/A 12/03/2022   Procedure: A/V Fistulagram;  Surgeon: Marea Selinda RAMAN, MD;  Location: [facility];  Service: Cardiovascular;  Laterality: N/A;   A/V FISTULAGRAM Left 02/11/2023   Procedure: A/V Fistulagram;  Surgeon: Marea Selinda RAMAN, MD;  Location: [facility];  Service: Cardiovascular;  Laterality: Left;   A/V FISTULAGRAM Left 02/07/2024   Procedure: A/V Fistulagram;  Surgeon: Marea Selinda RAMAN, MD;  Location: [facility];  Service: Cardiovascular;  Laterality: Left;   COLONOSCOPY     COLONOSCOPY WITH PROPOFOL  N/A 04/22/2021   Procedure: COLONOSCOPY WITH PROPOFOL ;  Surgeon: Maryruth Ole DASEN, MD;  Location: [facility];  Service: Endoscopy;  Laterality: N/A;   CORONARY ANGIOPLASTY WITH STENT PLACEMENT Left 01/25/2002   Procedure: CORONARY ANGIOPLASTY WITH STENT PLACEMENT; Location: [facility]; Surgeon: Margie Lovelace, MD   CORONARY ARTERY  BYPASS GRAFT N/A 02/20/2014   Procedure: CORONARY ARTERY BYPASS GRAFT; Location: [facility]; Surgeon: Reyes Fruits, MD   DIALYSIS FISTULA CREATION     DIALYSIS/PERMA CATHETER INSERTION N/A 04/08/2023   Procedure: DIALYSIS/PERMA CATHETER INSERTION;  Surgeon: Marea Selinda RAMAN, MD;  Location: [facility];  Service: Cardiovascular;  Laterality: N/A;   DIALYSIS/PERMA CATHETER REMOVAL N/A 07/06/2023   Procedure: DIALYSIS/PERMA CATHETER REMOVAL;  Surgeon: [name] Cordella MATSU, MD;  Location: [facility];  Service: Cardiovascular;  Laterality: N/A;   ESOPHAGOGASTRODUODENOSCOPY     ESOPHAGOGASTRODUODENOSCOPY N/A 04/22/2021   Procedure: ESOPHAGOGASTRODUODENOSCOPY (EGD);  Surgeon: Maryruth Ole DASEN, MD;  Location: [facility];  Service: Endoscopy;  Laterality: N/A;   FLEXIBLE SIGMOIDOSCOPY N/A 02/23/2019   Procedure: FLEXIBLE SIGMOIDOSCOPY;  Surgeon: Gaylyn Gladis PENNER, MD;  Location: [facility] ENDOSCOPY;  Service: Endoscopy;  Laterality: N/A;   IABP INSERTION  02/20/2014   Procedure: INTRA-AORTIC BALLOON PUMP INSERTION; Location: [facility]; Surgeon: Reyes Fruits, MD   INSERTION OF ARTERIOVENOUS (AV) ARTEGRAFT ARM Right 03/09/2024   Procedure: INSERTION, GRAFT, ARTERIOVENOUS, UPPER EXTREMITY;  Surgeon: Marea Selinda RAMAN, MD;  Location: [facility];  Service: Vascular;  Laterality: Right;  BRACHIAL AXILLARY   INSERTION OF DIALYSIS CATHETER N/A 04/20/2024   Procedure: INSERTION OF DIALYSIS CATHETER;  Surgeon: Marea Selinda RAMAN, MD;  Location: [facility];  Service: Vascular;  Laterality: N/A;   LEFT HEART CATH AND CORONARY ANGIOGRAPHY Left 01/24/2002   Procedure: LEFT HEART CATH AND CORONARY ANGIOGRAPHY; Location: [facility]; Surgeon: Margie Lovelace, MD   LEFT HEART CATH AND CORONARY ANGIOGRAPHY Left 05/25/2012   Procedure: LEFT HEART CATH AND CORONARY ANGIOGRAPHY; Location: [facility]; Surgeon: Margie Lovelace, MD   LEFT HEART CATH AND CORONARY ANGIOGRAPHY Left 10/09/2013   Procedure: LEFT HEART CATH AND CORONARY ANGIOGRAPHY;  Location: [facility]; Surgeon: Vinie Jude, MD   LEFT HEART CATH AND CORS/GRAFTS ANGIOGRAPHY N/A 08/08/2018   Procedure: LEFT HEART CATH AND CORS/GRAFTS ANGIOGRAPHY;  Surgeon: Fernand Denyse LABOR, MD;  Location: [facility];  Service: Cardiovascular;  Laterality: N/A;   LEFT HEART CATH AND CORS/GRAFTS ANGIOGRAPHY N/A 01/30/2019   Procedure: LEFT HEART CATH AND CORS/GRAFTS ANGIOGRAPHY;  Surgeon: Fernand Denyse LABOR, MD;  Location: [facility];  Service: Cardiovascular;  Laterality: N/A;   LIGATION ARTERIOVENOUS GORTEX GRAFT Right 04/20/2024   Procedure: LIGATION ARTERIOVENOUS GORTEX GRAFT;  Surgeon: Marea Selinda RAMAN, MD;  Location: [facility];  Service: Vascular;  Laterality: Right;   LOWER EXTREMITY ANGIOGRAPHY Left 04/24/2024   Procedure: Lower Extremity Angiography;  Surgeon: Marea Selinda RAMAN, MD;  Location: [facility];  Service: Cardiovascular;  Laterality: Left;   ultrasound guided pericardiocentesis     UPPER EXTREMITY ANGIOGRAPHY Right 03/30/2024   Procedure: Upper Extremity Angiography;  Surgeon: Marea Selinda RAMAN, MD;  Location: [facility];  Service: Cardiovascular;  Laterality: Right;     Social History:   reports that she has been smoking cigarettes. She has a 5 pack-year smoking history. She has never used smokeless tobacco. She reports that she does not drink alcohol and does not use drugs.   Family History:  Her family history includes Breast cancer in her maternal aunt and maternal grandmother; Cancer in an other family member; Diabetes type II in her maternal grandmother and mother; Hypertension in her father, mother, sister, and another family member; Kidney disease in her father; Ovarian cancer in her mother; Renal Disease in an other family member.   Allergies Allergies[1]   Home Medications  Prior to Admission medications  Medication Sig Start Date End Date Taking? Authorizing Provider  albuterol  (VENTOLIN  HFA) 108 (90 Base) MCG/ACT inhaler Inhale 1-2 puffs into  the lungs every 6 (six) hours as needed for wheezing or shortness of breath. 01/04/24  Yes Orlean Alan HERO, FNP  apixaban  (ELIQUIS ) 5 MG TABS tablet Take 5 mg by mouth 2 (two) times daily. 09/11/19  Yes [provider]  clobetasol cream (TEMOVATE) 0.05 % Apply 1 Application topically daily. 06/16/24  Yes [provider]  hydrOXYzine  (ATARAX /VISTARIL ) 50 MG tablet Take 50 mg by mouth 2 (two) times daily.   Yes [provider]  levothyroxine  (SYNTHROID ) 75 MCG tablet Take 75 mcg by mouth daily before breakfast.  11/02/18  Yes [provider]  lidocaine -prilocaine  (EMLA ) cream Apply 1 Application topically once. 01/22/21  Yes [provider]  [Paused] metoprolol  succinate (TOPROL -XL) 25 MG 24 hr tablet Take 1 tablet by mouth every morning. Wait to take this until your doctor or other care provider tells you to start again. 08/27/23 08/26/24 Yes [provider]  midodrine  (PROAMATINE ) 10 MG tablet Take 1 tablet (10 mg total) by mouth 3 (three) times daily. 06/27/24  Yes Jerelene Critchley, MD  oxyCODONE -acetaminophen  (PERCOCET) 5-325 MG tablet Take 1 tablet by mouth every 6 (six) hours as needed for severe pain (pain score 7-10). 06/27/24  Yes Ponnala, Shruthi, MD  pantoprazole  (PROTONIX ) 40 MG tablet Take 1 tablet (40 mg total) by mouth every morning. 06/27/24  Yes Ponnala, Shruthi, MD  pregabalin (LYRICA) 50 MG capsule Take 50 mg by mouth daily. Patient not taking: Reported on 06/23/2024    [provider]  ranolazine  (RANEXA ) 1000 MG SR tablet Take 1,000 mg by mouth 2 (two) times daily. Patient not taking: Reported on 06/23/2024 03/18/23   [provider]  rosuvastatin  (CRESTOR ) 20 MG tablet Take 20 mg by mouth at bedtime. Patient not taking: Reported on 06/23/2024 11/07/21   [provider]  sevelamer  carbonate (RENVELA ) 800 MG tablet Take 3,200 mg by mouth 3 (three) times daily with meals. Patient not taking: Reported on 06/23/2024     [provider]  venlafaxine  (EFFEXOR ) 37.5 MG tablet Take 37.5 mg by mouth 2 (  two) times daily with a meal. Patient not taking: Reported on 06/23/2024    [provider]     The patient is critically ill due to acute hypoxic respiratory failure, V-Fib arrest, toxic metabolic encephalopathy, MRSA bacteremia.  Critical care was necessary to treat or prevent imminent or life-threatening deterioration. I personally performed high risk medication and infusion titration and management and mechanical ventilation management and titration. Critical care time was spent by me on the following activities: development of a treatment plan with the patient and/or surrogate as well as nursing, discussions with consultants, evaluation of the patient's response to treatment, examination of the patient, obtaining a history from the patient or surrogate, ordering and performing treatments and interventions, ordering and review of laboratory studies, ordering and review of radiographic studies, review of telemetry data including pulse oximetry, re-evaluation of patient's condition and participation in multidisciplinary rounds.   I personally spent 36 minutes providing critical care not including any separately billable procedures.   Belva November, MD [facility] 07/08/2024 6:08 PM          [1]  Allergies Allergen Reactions   Morphine  And Codeine Hives   Levaquin [Levofloxacin] Itching and Other (See Comments)    Severe itching; prickly sensation; severe left leg pain; immobility.   Enalapril  Other (See Comments)    hallucinations   Latex Rash   Tape Rash and Other (See Comments)   "

## 2024-07-08 NOTE — Procedures (Signed)
 Removal of infected right internal jugular Hemodialysis Catheter:  Her right internal jugular HD Catheter is presumed infected (she is bacteremic).  ID has recommended its removal.  This is performed today because she needed it for HD yesterday and was fluid overloaded.  Family rovided consent and the form is in the chart.  She is intubated and sedated.  The right chest was prepped and 6 ml of 1% Lidocaine  with epi was infiltrated into the skin at the catheter exit site and around the palpable cuff.  The cuff was released from the subcutaneous tissue by blunt dissection with a clamp and sharp excision of tissue with a scissors until the catheter was free.  Pressure was held on the tunnel and exit site and the catheter and cuff were removed intact and completely.  Pressure was held until the site was hemostatic and a Xeroform guaze and compression dressing was applied.  He HOB will be kept elevated.  There were no complications and she tolerated the procedure well.  Norleen Pereyra, MD V[facility]

## 2024-07-08 NOTE — Progress Notes (Signed)
 Patient's HD catheter removed by vascular provider early this shift, patient tolerated well.

## 2024-07-08 NOTE — Consult Note (Signed)
 "                                                                                   Consultation Note Date: 07/08/2024 at 0907 Reason for consultation: goals of care    Patient Name: [name]  DOB: 13-Feb-1961  MRN: [id number]  Age / Sex: 64 y.o., female  PCP: Orlean Alan HERO, FNP Referring Physician: Isadora Hose, MD  HPI/Patient Profile: 64 y.o. female with past medical history significant for ESRD on HD (M,W,F), COPD, HFrEF (25-30%), bipolar disorder, chronic hypotension on midodrine . Patient presented to ED 07/01/2024 after being found down at her home by brother.   In the ED, patient was found to sleepy but arousable. She was not sure how she ended up on floor. Brother at bedside reported he was the one that found her and is worried since she was here recently with the same complaint.   ED labs significant for WBC 12, platelets 82. Anion gap 26, BUN 28, creatinine 5.07, and GFR 9 (ESRD on HD).Calcium  10.4. Troponin 155 (around baseline).   ED vitals 116/55, HR 88, RR 18, SpO2 100%, 98.64F  CXR  1. Pulmonary interstitial prominence with mild pulmonary edema, similar . 2. Decreased left pleural effusion. 3. Cardiomegaly.  DR wrist, pelvis negative for fracture.  CT Head 1. No acute intracranial abnormality. 2. Chronic encephalomalacia within bilateral frontal, parietal, and right occipital lobes compatible with remote infarcts. 3. Extensive periventricular and subcortical white matter low-density changes compatible with chronic microvascular ischemic change.  CT cervical spine 1. No acute cervical spine findings. 2. Stable chronic endplate changes and focal kyphosis at C5-6.  TRH was consulted for admission and management of syncope with fall, ESRD on HD, lethargy, HFrEF, afib on Eliquis , elevated troponin, chronic hypotension on midodrine  leukocytosis and chronic L heel ulcer.   Of note, patient had recent admission 06/23/23 for similar complaint of syncope and was  admitted for fluid overload, hypotension and  cardiogenic shock requiring pressors.  Shortly after patient underwent dialysis, CODE BLUE was activated after patient went unresponsive. She was found to be in V-fib arrest. Patient received 2 rounds epi, calcium , bicarb and shock with ROSC. She was intubated and transferred to ICU.   Palliative medicine team was consulted for assistance with goals of care conversations.   Patient is well-known to PMT after recent consults and visits dated 06/24/24- 06/26/24 and 07/05/24- 07/06/24.   Summary of counseling/coordination of care Chart reviewed:      Latest Ref Rng & Units 07/07/2024    4:12 AM 07/06/2024    4:07 AM 07/05/2024    4:39 AM  CBC  WBC 4.0 - 10.5 K/uL 17.1  15.5  13.7   Hemoglobin 12.0 - 15.0 g/dL 88.4  88.2  88.7   Hematocrit 36.0 - 46.0 % 32.1  33.7  32.6   Platelets 150 - 400 K/uL 84  78  68       Latest Ref Rng & Units 07/08/2024    4:28 AM 07/07/2024    4:12 AM 07/06/2024    4:07 AM  CMP  Glucose 70 - 99 mg/dL 97  891  96   BUN 8 -  23 mg/dL 48  58  29   Creatinine 0.44 - 1.00 mg/dL 7.48  6.36  7.16   Sodium 135 - 145 mmol/L 130  129  130   Potassium 3.5 - 5.1 mmol/L 3.7  4.1  4.0   Chloride 98 - 111 mmol/L 89  89  90   CO2 22 - 32 mmol/L 26  26  26    Calcium  8.9 - 10.3 mg/dL 9.9  89.0  89.9      Blood pressure 98/63, pulse (!) 57, temperature 97.7 F (36.5 C), temperature source Axillary, resp. rate 16, height 5' 6 (1.676 m), weight 75.3 kg, SpO2 99%.  Patient Active Problem List   Diagnosis Date Noted   Acute on chronic respiratory failure with hypoxia (HCC) 07/07/2024   Acute on chronic HFrEF (heart failure with reduced ejection fraction) (HCC) 07/07/2024   MRSA bacteremia 07/06/2024   ESRD (end stage renal disease) on dialysis (HCC) 07/06/2024   Fall 07/05/2024   Acute pulmonary edema (HCC) 07/05/2024   Bacteremia due to Streptococcus 07/05/2024   Other secondary kyphosis, cervical region 07/03/2024   Cervical  spondylosis 07/03/2024   Syncope and collapse 07/01/2024   Syncope 06/22/2024   Acute idiopathic gout of left foot 04/13/2024   Bunion of great toe 04/13/2024   Other psychoactive substance abuse, uncomplicated (HCC) 03/15/2024   Bipolar disorder in full remission, most recent episode unspecified type 03/15/2024   Chronic pain syndrome 03/15/2024   De Quervain's tenosynovitis, right 07/06/2023   NPDH (new persistent daily headache) 07/06/2023   Chest pain 05/17/2023   Hypertension    Physical deconditioning 03/27/2023   Hallux valgus of right foot 01/07/2023   Pain of left lower extremity 01/07/2023   Peripheral vascular disease of lower extremity 01/02/2023   Other cirrhosis of liver (HCC) 10/08/2022   Ascites 12/19/2021   Hepatitis C antibody positive in blood 12/19/2021   Thrombocytopenia 08/08/2021   Paroxysmal atrial fibrillation (HCC)    Hypothyroidism    ESRD (end stage renal disease) (HCC) 01/30/2019   Swelling of limb 09/22/2016   Generalized weakness 10/15/2014   ESRD on hemodialysis (HCC) 10/15/2014   Chronic hypotension 10/15/2014   Chronic combined systolic and diastolic CHF (congestive heart failure) (HCC) 10/15/2014   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   Pericardial effusion 04/12/2014   Chest wall pain following surgery 04/12/2014   Lactic acidosis 03/26/2014   Mild dementia associated with other underlying disease, without behavioral disturbance, psychotic disturbance, mood disturbance, or anxiety (HCC) 03/25/2014   Atrial flutter (HCC) 03/22/2014   Superficial incisional surgical site infection 03/14/2014   Exhausted vascular access 02/24/2014   Anemia of chronic disease 02/24/2014   S/P CABG x 3 02/21/2014   Hyperlipidemia 02/13/2014   History of hepatitis C 02/13/2014   HFrEF (heart failure with reduced ejection fraction) (HCC) 02/13/2014   Retrosternal chest pain 05/30/2013   Gallstones 05/29/2013     Progress notes: Reviewed progress notes from  [facility], PCCM, vascular surgery, PMT, RT, nephrology and nursing staff.   Imaging: MR Brain 07/01/24 1. No acute abnormality. 2. Multiple remote infarcts as above.   MAR: No changes to [facility]  ACP documents: None on file   After reviewing chart, I assessed patient at bedside and spoke to Fairy (brother) and [name] (sister) regarding goals of care.   Ill-appearing female lying in bed not sedated on mechanical ventilation. She responds minimally to verbal stimuli, opens eyes and acknowledges sister's presence then immediately goes back to sleep. She is in no distress.  Brother, sister and primary RN at bedside.   I introduced myself as a publishing rights manager in Palliative Medicine following up for continuing goals of care.   Both Fairy and [name] share they want to continue current medical treatment and allow time for outcomes. They hope Karla will wake up and be able to talk to them in the next few days.   Family is facing treatment option decisions, advanced directive, and anticipatory care needs.    Discussed with patient/family the importance of continued conversation with family and the medical providers regarding overall plan of care and treatment options, ensuring decisions are within the context of the patients values and GOCs.    Questions and concerns were addressed. The family was encouraged to call with questions or concerns.   Primary Decision Maker NEXT OF KIN Joseph Kenton, brother  Physical Exam Vitals reviewed.  Constitutional:      General: She is not in acute distress.    Appearance: She is ill-appearing.  HENT:     Head: Normocephalic and atraumatic.     Mouth/Throat:     Mouth: Mucous membranes are dry.     Comments: ET/OG in place Pulmonary:     Effort: No respiratory distress.     Comments: Mechanically ventilated  Musculoskeletal:     Right lower leg: No edema.     Left lower leg: No edema.  Skin:    General: Skin is warm and dry.     Comments: Dressing  to chronic L heel wound    Recommendations/Plan: DNR Continue current supportive interventions Family wants to allow a few days for outcomes Palliative will follow up for continuing GOC discussions  Palliative Assessment/Data: 10%     Thank you for this consult. Palliative medicine will continue to follow and assist holistically.   I personally spent a total of 50 minutes in the care of the patient today including preparing to see the patient, getting/reviewing separately obtained history, performing a medically appropriate exam/evaluation, counseling and educating, referring and communicating with other health care professionals, documenting clinical information in the EHR, independently interpreting results, communicating results, and coordinating care.     Devere Sacks, ELNITA- [facility] Palliative Medicine Team  07/08/2024 9:07 AM  Office [phone]  Pager [phone]     Please contact Palliative Medicine Team providers via AMION for questions and concerns.   "

## 2024-07-08 NOTE — Progress Notes (Signed)
 PHARMACY - ANTICOAGULATION CONSULT NOTE  Pharmacy Consult for UFH infusion Indication: atrial fibrillation  Allergies[1]  Patient Measurements: Height: 5' 6 (167.6 cm) Weight: 75.3 kg (166 lb 0.1 oz) IBW/kg (Calculated) : 59.3 HEPARIN  DW (KG): 70.5  Labs: Recent Labs    07/06/24 0407 07/06/24 1309 07/06/24 2107 07/07/24 0412 07/08/24 0428 07/08/24 0915  HGB 11.7*  --   --  11.5*  --   --   HCT 33.7*  --   --  32.1*  --   --   PLT 78*  --   --  84*  --   --   APTT 61* 86* 72* 78*  --   --   HEPARINUNFRC 0.53  --   --  0.46  --  0.39  CREATININE 2.83*  --   --  3.63* 2.51*  --     Estimated Creatinine Clearance: 24.4 mL/min (A) (by C-G formula based on SCr of 2.51 mg/dL (H)).   Medical History: Past Medical History:  Diagnosis Date   Acute lacunar infarction (HCC) 03/03/2014   a.) MRI brain 03/03/2014: two acute punctate lacunar infarcts anteriorly in the BILATERAL centrum semiovale   Adult behavior problems    Anemia of chronic renal failure    Aortic atherosclerosis    Arthritis    Atrial fibrillation and flutter (HCC)    a.) CHA2DS2VASc = 6 (sex, HFrEF, HTN, CVA x 2, prior MI/vascular disease) as of 02/04/2024; b.) rate/rhythm maintained on oral metoprolol  succinate; chronically anticoagulated with apixaban    Bipolar disorder (HCC)    Cardiomegaly    Cerebral microvascular disease    CHF (congestive heart failure) (HCC)    Chicken pox    Cholelithiasis    Chronic respiratory failure with hypoxia (HCC) 12/19/2021   COPD (chronic obstructive pulmonary disease) (HCC)    Coronary artery disease    DDD (degenerative disc disease), cervical    DDD (degenerative disc disease), thoracolumbar    Diverticulosis    Dyspnea    ESRD (end stage renal disease) on dialysis (M,W,F)    GERD (gastroesophageal reflux disease)    Gout    HCV (hepatitis C virus)    Headache    HFrEF (heart failure with reduced ejection fraction) (HCC)    History of delirium 04/12/2014    History of substance abuse (HCC)    a.) tobacco + cocaine   Hyperkalemia 11/13/2014   Hyperlipidemia    Hypertension    Hypotension 02/24/2014   Hypothyroidism    Ischemic cardiomyopathy    Nose colonized with MRSA 02/01/2024   a.) presurgical PCR (+) on 02/01/2024 prior to A/V FISTULAGRAM; b.) presurgical PCR (+) on 03/07/2024 prior to AV FISTULA CREATION   NSTEMI (non-ST elevated myocardial infarction) (HCC) 10/08/2013   NSTEMI (non-ST elevated myocardial infarction) (HCC) 01/23/2002   a.) LHC 01/24/2002 --> multi-vessel CAD with IRA being 90% OM2 --> delayed PCI until 01/25/2002 --> 3.0 x 23 mm BX Velocity stent   Obesity    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    On apixaban  therapy    Peripheral vascular disease    Pneumonia    Postoperative anemia due to acute blood loss 02/27/2014   Postoperative cerebrovascular infarction following cardiac surgery 02/21/2014   a.) developed RIGHT upper/lower extremity weakness following cardiac revascularization --> neuro consult and head CT --> Hypodensity noted in the area of the LEFT motor cortex consistent with a component of acute ischemia   Pulmonary hypertension (HCC)    RBBB (right bundle branch  block)    Renal osteodystrophy    S/P CABG x 3 02/20/2014   a.) LIMA-LAD, SVG-OM1, SVG-PDA   Stroke (HCC) 05/23/2010   a.) brain MRI 05/23/2010: BILATERAL acute infarcts and old prior infarcts; RIGHT frontal lobe extending into the white matter. Small focus of restricted diffusion in the cortex of the medial LEFT frontoparietal lobe. Periventricular T2 and multiple foci of T2 hyperintensity in the subcortical white matter and increased FLAIR signal in the cortex of the LEFT frontal lobe.   Syncope and collapse 07/25/2020   Assessment: 64 y/o F with a h/o atrial fibrillation on apixaban . Last dose was 07/30/2024 at 0900. Patient coded on 07-30-2024 with subsequent ROSC and is now in the ICU on ventilation and pressor support. Pharmacy consulted  for IV heparin .   Goal of Therapy:  anti-Xa level  0.3-0.7 units/ml Monitor platelets by anticoagulation protocol: Yes   Plan:  --HL therapeutic x 3 --Continue heparin  infusion at 1450 units/hr --Re-check HL daily w/ AM labs while therapeutic --Daily CBC  [name] [name] 07/08/2024 12:42 PM    [1]  Allergies Allergen Reactions   Morphine  And Codeine Hives   Levaquin [Levofloxacin] Itching and Other (See Comments)    Severe itching; prickly sensation; severe left leg pain; immobility.   Enalapril  Other (See Comments)    hallucinations   Latex Rash   Tape Rash and Other (See Comments)

## 2024-07-08 NOTE — Consult Note (Signed)
 PHARMACY CONSULT NOTE - ELECTROLYTES  Pharmacy Consult for Electrolyte Monitoring and Replacement   Recent Labs: Height: 5' 6 (167.6 cm) Weight: 75.3 kg (166 lb 0.1 oz) IBW/kg (Calculated) : 59.3 Estimated Creatinine Clearance: 24.4 mL/min (A) (by C-G formula based on SCr of 2.51 mg/dL (H)). Potassium (mmol/L)  Date Value  07/08/2024 3.7  07/11/2014 4.7   Magnesium  (mg/dL)  Date Value  98/75/7973 2.1  04/10/2014 1.9   Calcium  (mg/dL)  Date Value  98/75/7973 9.9   Calcium , Total (mg/dL)  Date Value  98/74/7983 8.3 (L)   Albumin  (g/dL)  Date Value  98/76/7973 2.8 (L)  07/07/2014 3.1 (L)   Phosphorus (mg/dL)  Date Value  98/75/7973 2.2 (L)  07/09/2014 3.9   Sodium (mmol/L)  Date Value  07/08/2024 130 (L)  07/09/2014 139    Assessment  [name] is a 64 y.o. female presenting with syncope/in-hospital cardiac arrest. PMH significant for ESRD HD MWF, AF on eliquis , HFrEF, COPD, prior CVA. Pharmacy has been consulted to monitor and replace electrolytes.  Diet: Vital AF at 20 mL/hr + free water  flushes at 30 mL every 4 hours  Goal of Therapy:  Within normal limits  Plan:  Mild hyponatremia, stable Mild hypophosphatemia, K Phos  Neutral 500 mg per tube x 1 dose Re-check renal function panel tomorrow  Thank you for allowing pharmacy to be a part of this patient's care.  Marolyn KATHEE [name] 07/08/2024 7:20 AM

## 2024-07-08 NOTE — Plan of Care (Signed)
" °  Problem: Health Behavior/Discharge Planning: Goal: Ability to manage health-related needs will improve Outcome: Not Progressing   Problem: Clinical Measurements: Goal: Ability to maintain clinical measurements within normal limits will improve Outcome: Progressing Goal: Respiratory complications will improve Outcome: Progressing Goal: Cardiovascular complication will be avoided Outcome: Progressing   Problem: Nutrition: Goal: Adequate nutrition will be maintained Outcome: Progressing   Problem: Elimination: Goal: Will not experience complications related to bowel motility Outcome: Progressing     "

## 2024-07-08 NOTE — Progress Notes (Signed)
 " [facility]  PROGRESS NOTE   Subjective:   Patient remains critically ill. Still on the ventilator. Patient underwent hemodialysis treatment yesterday. Nurses cleaning patient up at the moment.   Objective:  Vital signs: Blood pressure (!) 100/51, pulse (!) 49, temperature 97.7 F (36.5 C), temperature source Axillary, resp. rate 19, height 5' 6 (1.676 m), weight 75.3 kg, SpO2 100%.  Intake/Output Summary (Last 24 hours) at 07/08/2024 0932 Last data filed at 07/08/2024 0700 Gross per 24 hour  Intake 1662.3 ml  Output 2000 ml  Net -337.7 ml   Filed Weights   07/07/24 1415 07/07/24 1805 07/08/24 0435  Weight: 77.1 kg 75.1 kg 75.3 kg     Physical Exam: General:  No acute distress  Head:  Normocephalic, atraumatic.  ET tube, OG tube in place  Eyes:  Anicteric  Lungs:   Clear to auscultation, ventilator assisted  Heart:  S1S2 no rubs  Abdomen:   Soft, distended, hypoactive bowel sounds  Extremities:  + peripheral edema.  Neurologic:  Opens eyes to voice but not following commands.  Skin:  Left foot wound  Access:  Right IJ dialysis catheter    Basic Metabolic Panel: Recent Labs  Lab 07/04/24 0441 07/04/24 0727 07/05/24 0439 07/06/24 0407 07/07/24 0412 07/08/24 0428  NA 134*  --  131* 130* 129* 130*  K 3.9 3.9 3.7 4.0 4.1 3.7  CL 92*  --  91* 90* 89* 89*  CO2 25  --  25 26 26 26   GLUCOSE 115*  --  79 96 108* 97  BUN 21  --  36* 29* 58* 48*  CREATININE 3.18*  --  3.97* 2.83* 3.63* 2.51*  CALCIUM  9.8  --  10.2 10.0 10.9* 9.9  MG 1.9 1.9 2.4 2.1 2.1 2.1  PHOS 2.7  --  2.8 2.8 3.0 2.2*   GFR: Estimated Creatinine Clearance: 24.4 mL/min (A) (by C-G formula based on SCr of 2.51 mg/dL (H)).  Liver Function Tests: Recent Labs  Lab 07/01/24 1336 07/01/24 2155 07/02/24 0526 07/02/24 1646 07/04/24 0441 07/05/24 0439 07/06/24 0407 07/07/24 0412  AST 36 36 37 54*  --   --   --   --   ALT 15 15 14 19   --   --   --   --   ALKPHOS 71 68 72 81  --    --   --   --   BILITOT 1.2 1.2 1.3* 1.4*  --   --   --   --   PROT 7.2 6.8 6.9 6.6  --   --   --   --   ALBUMIN  3.8 3.5 3.5 3.5 3.1* 2.9* 3.0* 2.8*   No results for input(s): LIPASE, AMYLASE in the last 168 hours. Recent Labs  Lab 07/01/24 1726  AMMONIA 15    CBC: Recent Labs  Lab 07/01/24 1336 07/02/24 0526 07/03/24 0543 07/04/24 0441 07/05/24 0439 07/06/24 0407 07/07/24 0412  WBC 12.0*   < > 14.7* 13.9* 13.7* 15.5* 17.1*  NEUTROABS 10.4*  --   --   --   --   --   --   HGB 12.7   < > 12.4 12.1 11.2* 11.7* 11.5*  HCT 38.7   < > 37.9 35.8* 32.6* 33.7* 32.1*  MCV 97.0   < > 96.2 93.0 89.8 89.4 86.5  PLT 82*   < > 63* 73* 68* 78* 84*   < > = values in this interval not displayed.  HbA1C: No results found for: HGBA1C  Urinalysis: No results for input(s): COLORURINE, LABSPEC, PHURINE, GLUCOSEU, HGBUR, BILIRUBINUR, KETONESUR, PROTEINUR, UROBILINOGEN, NITRITE, LEUKOCYTESUR in the last 72 hours.  Invalid input(s): APPERANCEUR    Imaging: MR BRAIN WO CONTRAST Result Date: 07/07/2024 EXAM: MRI BRAIN WITHOUT CONTRAST 07/07/2024 11:00:58 PM TECHNIQUE: Multiplanar multisequence MRI of the head/brain was performed without the administration of intravenous contrast. COMPARISON: MRI head August 16, 2023 CLINICAL HISTORY: Encephalopathy. FINDINGS: BRAIN AND VENTRICLES: No acute infarct. Multifocal remote encephalomalacia with surrounding gliosis in the bilateral frontal lobes, right parietooccipital region, left parietal lobe, and cerebellum. No intracranial hemorrhage. No mass. No midline shift. No hydrocephalus. The sella is unremarkable. Chronic abnormal left vertebral artery flow void. ORBITS: No significant abnormality. SINUSES AND MASTOIDS: No significant abnormality. BONES AND SOFT TISSUES: Normal marrow signal. No soft tissue abnormality. IMPRESSION: 1. No acute abnormality. 2. Multiple remote infarcts as above. Electronically signed by: Glendia Molt MD  07/07/2024 11:35 PM EST RP Workstation: [id number]      Medications:    amiodarone  30 mg/hr (07/08/24 0700)   cefTRIAXone  (ROCEPHIN )  IV Stopped (07/07/24 2030)   heparin  1,450 Units/hr (07/08/24 0700)   norepinephrine  (LEVOPHED ) Adult infusion Stopped (07/04/24 2343)   propofol  (DIPRIVAN ) infusion Stopped (07/06/24 0808)   vancomycin  Stopped (07/07/24 1746)   vasopressin  Stopped (07/07/24 2004)    Chlorhexidine  Gluconate Cloth  6 each Topical Daily   collagenase   1 Application Topical Daily   feeding supplement (PROSource TF20)  60 mL Per Tube Daily   feeding supplement (VITAL AF 1.2 CAL)  1,000 mL Per Tube Q24H   free water   30 mL Per Tube Q4H   levothyroxine   75 mcg Oral Q0600   metoprolol  tartrate  12.5 mg Oral BID   midodrine   10 mg Oral TID WC   multivitamin  1 tablet Per Tube QHS   nutrition supplement (JUVEN)  1 packet Per Tube BID BM   mouth rinse  15 mL Mouth Rinse Q2H   pantoprazole  (PROTONIX ) IV  40 mg Intravenous Daily   sodium chloride  flush  10-40 mL Intracatheter Q12H   venlafaxine   37.5 mg Oral BID WC    Assessment/ Plan:     64 year old female with history of coronary disease history of CABG, ESRD, COPD, atrial fibrillation on hemodialysis Monday Wednesday and Friday now admitted with history of syncope/fall.  In hospital cardiac arrest on Sunday, 07/02/2024 Outpatient dialysis: [facility]/MWF/right PermCath/72.5 kg    ESRD: HD Monday Wednesday and Friday.  Patient underwent hemodialysis treatment yesterday.  No acute indication for dialysis treatment today.  Monitor serum electrolytes and we will plan for dialysis again on Monday.  ANEMIA of chronic kidney disease: Hemoglobin 11.5 at last check.  Continue to monitor periodically.   MBD (secondary hyperparathyroidism): Maintain the patient on sodium thiosulfate  for calciphylaxis of the left foot.   Hypotension/VOL/edema: Off pressors now.  Continues to have A-fib with RVR.  Cardiology team  following along for chronic systolic CHF.  IV amiodarone  drip.  Acute respiratory failure-requiring ventilator postcardiac arrest on 07/02/2024.  Continue ventilatory support at this time.    Labs and medications reviewed. Will continue to follow along with you.   LOS: 6 Dael Howland, MD [facility] 1/24/20269:32 AM  "

## 2024-07-08 NOTE — Plan of Care (Signed)
 Continuing with plan of care.

## 2024-07-08 NOTE — Progress Notes (Signed)
 Patient ID: [name], female   DOB: October 08, 1960, 64 y.o.   MRN: [id number] [facility]    SUBJECTIVE: Intubated bradycardia arrhythmia intermittently associated not responding to verbal stimuli off of pressors still intubated on reduced sedation   Vitals:   07/08/24 1100 07/08/24 1104 07/08/24 1115 07/08/24 1130  BP: 106/62  102/60 (!) 103/59  Pulse: (!) 59  (!) 51 (!) 46  Resp: 16  16 20   Temp:      TempSrc:      SpO2: 97% 94% 99% 97%  Weight:      Height:         Intake/Output Summary (Last 24 hours) at 07/08/2024 1309 Last data filed at 07/08/2024 1100 Gross per 24 hour  Intake 1457.68 ml  Output 2000 ml  Net -542.32 ml      PHYSICAL EXAM  General: Well developed, well nourished, in no acute distress HEENT:  Normocephalic and atramatic Neck:  No JVD.  Lungs: Clear bilaterally to auscultation and percussion. Heart: HRRR . Normal S1 and S2 without gallops or murmurs.  Abdomen: Bowel sounds are positive, abdomen soft and non-tender  Msk:  Back normal, normal gait. Normal strength and tone for age. Extremities: No clubbing, cyanosis or edema.   Neuro: Alert and oriented X 3. Psych:  Good affect, responds appropriately   LABS: Basic Metabolic Panel: Recent Labs    07/07/24 0412 07/08/24 0428  NA 129* 130*  K 4.1 3.7  CL 89* 89*  CO2 26 26  GLUCOSE 108* 97  BUN 58* 48*  CREATININE 3.63* 2.51*  CALCIUM  10.9* 9.9  MG 2.1 2.1  PHOS 3.0 2.2*   Liver Function Tests: Recent Labs    07/06/24 0407 07/07/24 0412  ALBUMIN  3.0* 2.8*   No results for input(s): LIPASE, AMYLASE in the last 72 hours. CBC: Recent Labs    07/06/24 0407 07/07/24 0412  WBC 15.5* 17.1*  HGB 11.7* 11.5*  HCT 33.7* 32.1*  MCV 89.4 86.5  PLT 78* 84*   Cardiac Enzymes: No results for input(s): CKTOTAL, CKMB, CKMBINDEX, TROPONINI in the last 72 hours. BNP: Invalid input(s): POCBNP D-Dimer: No results for input(s): DDIMER in the last 72 hours. Hemoglobin  A1C: No results for input(s): HGBA1C in the last 72 hours. Fasting Lipid Panel: Recent Labs    07/06/24 0407  TRIG 155*   Thyroid  Function Tests: No results for input(s): TSH, T4TOTAL, T3FREE, THYROIDAB in the last 72 hours.  Invalid input(s): FREET3 Anemia Panel: No results for input(s): VITAMINB12, FOLATE, FERRITIN, TIBC, IRON, RETICCTPCT in the last 72 hours.  MR BRAIN WO CONTRAST Result Date: 07/07/2024 EXAM: MRI BRAIN WITHOUT CONTRAST 07/07/2024 11:00:58 PM TECHNIQUE: Multiplanar multisequence MRI of the head/brain was performed without the administration of intravenous contrast. COMPARISON: MRI head August 16, 2023 CLINICAL HISTORY: Encephalopathy. FINDINGS: BRAIN AND VENTRICLES: No acute infarct. Multifocal remote encephalomalacia with surrounding gliosis in the bilateral frontal lobes, right parietooccipital region, left parietal lobe, and cerebellum. No intracranial hemorrhage. No mass. No midline shift. No hydrocephalus. The sella is unremarkable. Chronic abnormal left vertebral artery flow void. ORBITS: No significant abnormality. SINUSES AND MASTOIDS: No significant abnormality. BONES AND SOFT TISSUES: Normal marrow signal. No soft tissue abnormality. IMPRESSION: 1. No acute abnormality. 2. Multiple remote infarcts as above. Electronically signed by: Glendia Molt MD 07/07/2024 11:35 PM EST RP Workstation: [id number]     Echo severely depressed left ventricular function EF of less than 25%  TELEMETRY: Atrial fibrillation bundle branch block rate of 70:  ASSESSMENT AND  PLAN:  Principal Problem:   Syncope and collapse Active Problems:   ESRD on hemodialysis (HCC)   Chronic hypotension   S/P CABG x 3   Mild dementia associated with other underlying disease, without behavioral disturbance, psychotic disturbance, mood disturbance, or anxiety (HCC)   Anemia of chronic disease   ESRD (end stage renal disease) (HCC)   Paroxysmal atrial fibrillation (HCC)    Hypothyroidism   Chronic combined systolic and diastolic CHF (congestive heart failure) (HCC)   Hepatitis C antibody positive in blood   Other cirrhosis of liver (HCC)   Hypertension   Bipolar disorder in full remission, most recent episode unspecified type   Chronic pain syndrome   Other secondary kyphosis, cervical region   Cervical spondylosis   Fall   Acute pulmonary edema (HCC)   Bacteremia due to Streptococcus   MRSA bacteremia   ESRD (end stage renal disease) on dialysis (HCC)   Acute on chronic respiratory failure with hypoxia (HCC)   Acute on chronic HFrEF (heart failure with reduced ejection fraction) (HCC)    Plan Chronic HFrEF stable continue current therapy Recurrent syncope unclear etiology continue blood pressure support Streptococcal bacteremia continue broad-spectrum antibiotic therapy End-stage renal disease on dialysis Wide-complex tachycardia appears to be A-fib with aberrancy continue amiodarone  discontinue lidocaine  Appreciate EP input continue conservative management Heparin  for anticoagulation transition to Eliquis  Consider palliative care  [name], MD 07/08/2024 1:09 PM

## 2024-07-09 DIAGNOSIS — I469 Cardiac arrest, cause unspecified: Secondary | ICD-10-CM | POA: Diagnosis not present

## 2024-07-09 DIAGNOSIS — I2510 Atherosclerotic heart disease of native coronary artery without angina pectoris: Secondary | ICD-10-CM | POA: Diagnosis not present

## 2024-07-09 DIAGNOSIS — R6521 Severe sepsis with septic shock: Secondary | ICD-10-CM | POA: Diagnosis not present

## 2024-07-09 DIAGNOSIS — A419 Sepsis, unspecified organism: Secondary | ICD-10-CM | POA: Diagnosis not present

## 2024-07-09 LAB — CBC
HCT: 29 % — ABNORMAL LOW (ref 36.0–46.0)
Hemoglobin: 10.3 g/dL — ABNORMAL LOW (ref 12.0–15.0)
MCH: 30.3 pg (ref 26.0–34.0)
MCHC: 35.5 g/dL (ref 30.0–36.0)
MCV: 85.3 fL (ref 80.0–100.0)
Platelets: 90 10*3/uL — ABNORMAL LOW (ref 150–400)
RBC: 3.4 MIL/uL — ABNORMAL LOW (ref 3.87–5.11)
RDW: 19.9 % — ABNORMAL HIGH (ref 11.5–15.5)
WBC: 14.2 10*3/uL — ABNORMAL HIGH (ref 4.0–10.5)
nRBC: 0.2 % (ref 0.0–0.2)

## 2024-07-09 LAB — RENAL FUNCTION PANEL
Albumin: 2.8 g/dL — ABNORMAL LOW (ref 3.5–5.0)
Anion gap: 17 — ABNORMAL HIGH (ref 5–15)
BUN: 74 mg/dL — ABNORMAL HIGH (ref 8–23)
CO2: 24 mmol/L (ref 22–32)
Calcium: 10.7 mg/dL — ABNORMAL HIGH (ref 8.9–10.3)
Chloride: 87 mmol/L — ABNORMAL LOW (ref 98–111)
Creatinine, Ser: 3.45 mg/dL — ABNORMAL HIGH (ref 0.44–1.00)
GFR, Estimated: 14 mL/min — ABNORMAL LOW
Glucose, Bld: 102 mg/dL — ABNORMAL HIGH (ref 70–99)
Phosphorus: 3.1 mg/dL (ref 2.5–4.6)
Potassium: 4 mmol/L (ref 3.5–5.1)
Sodium: 129 mmol/L — ABNORMAL LOW (ref 135–145)

## 2024-07-09 LAB — CULTURE, BLOOD (ROUTINE X 2)
Culture: NO GROWTH
Culture: NO GROWTH
Special Requests: ADEQUATE
Special Requests: ADEQUATE

## 2024-07-09 LAB — GLUCOSE, CAPILLARY
Glucose-Capillary: 100 mg/dL — ABNORMAL HIGH (ref 70–99)
Glucose-Capillary: 101 mg/dL — ABNORMAL HIGH (ref 70–99)
Glucose-Capillary: 114 mg/dL — ABNORMAL HIGH (ref 70–99)
Glucose-Capillary: 114 mg/dL — ABNORMAL HIGH (ref 70–99)
Glucose-Capillary: 112 mg/dL — ABNORMAL HIGH (ref 70–99)

## 2024-07-09 LAB — TRIGLYCERIDES: Triglycerides: 112 mg/dL

## 2024-07-09 LAB — CYTOLOGY - NON PAP

## 2024-07-09 LAB — HEPARIN LEVEL (UNFRACTIONATED): Heparin Unfractionated: 0.41 [IU]/mL (ref 0.30–0.70)

## 2024-07-09 NOTE — Progress Notes (Signed)
 [facility]    SUBJECTIVE: Patient intubated but alert following commands appears to be oriented resting comfortably   Vitals:   07/09/24 1030 07/09/24 1045 07/09/24 1059 07/09/24 1100  BP: 124/60  109/63 109/63  Pulse: (!) 52 (!) 50 (!) 50 (!) 54  Resp: 16 16 16 17   Temp:      TempSrc:      SpO2: 98% 97% 97% 95%  Weight:      Height:         Intake/Output Summary (Last 24 hours) at 07/09/2024 1202 Last data filed at 07/09/2024 1100 Gross per 24 hour  Intake 1112.92 ml  Output --  Net 1112.92 ml      PHYSICAL EXAM Patient still intubated General: Well developed, well nourished, in no acute distress HEENT:  Normocephalic and atramatic Neck:  No JVD.  Lungs: Clear bilaterally to auscultation and percussion. Heart: HRRR . Normal S1 and S2 without gallops or murmurs.  Abdomen: Bowel sounds are positive, abdomen soft and non-tender  Msk:  Back normal, normal gait. Normal strength and tone for age. Extremities: No clubbing, cyanosis or edema.  Reduced pulses poor circulation Neuro: Alert and oriented X 3. Psych:  Good affect, responds appropriately   LABS: Basic Metabolic Panel: Recent Labs    07/07/24 0412 07/08/24 0428 07/09/24 0425  NA 129* 130* 129*  K 4.1 3.7 4.0  CL 89* 89* 87*  CO2 26 26 24   GLUCOSE 108* 97 102*  BUN 58* 48* 74*  CREATININE 3.63* 2.51* 3.45*  CALCIUM  10.9* 9.9 10.7*  MG 2.1 2.1  --   PHOS 3.0 2.2* 3.1   Liver Function Tests: Recent Labs    07/07/24 0412 07/09/24 0425  ALBUMIN  2.8* 2.8*   No results for input(s): LIPASE, AMYLASE in the last 72 hours. CBC: Recent Labs    07/07/24 0412 07/09/24 0425  WBC 17.1* 14.2*  HGB 11.5* 10.3*  HCT 32.1* 29.0*  MCV 86.5 85.3  PLT 84* 90*   Cardiac Enzymes: No results for input(s): CKTOTAL, CKMB, CKMBINDEX, TROPONINI in the last 72 hours. BNP: Invalid input(s): POCBNP D-Dimer: No results for input(s): DDIMER in the last 72 hours. Hemoglobin A1C: No results for  input(s): HGBA1C in the last 72 hours. Fasting Lipid Panel: Recent Labs    07/09/24 0425  TRIG 112   Thyroid  Function Tests: No results for input(s): TSH, T4TOTAL, T3FREE, THYROIDAB in the last 72 hours.  Invalid input(s): FREET3 Anemia Panel: No results for input(s): VITAMINB12, FOLATE, FERRITIN, TIBC, IRON, RETICCTPCT in the last 72 hours.  MR BRAIN WO CONTRAST Result Date: 07/07/2024 EXAM: MRI BRAIN WITHOUT CONTRAST 07/07/2024 11:00:58 PM TECHNIQUE: Multiplanar multisequence MRI of the head/brain was performed without the administration of intravenous contrast. COMPARISON: MRI head August 16, 2023 CLINICAL HISTORY: Encephalopathy. FINDINGS: BRAIN AND VENTRICLES: No acute infarct. Multifocal remote encephalomalacia with surrounding gliosis in the bilateral frontal lobes, right parietooccipital region, left parietal lobe, and cerebellum. No intracranial hemorrhage. No mass. No midline shift. No hydrocephalus. The sella is unremarkable. Chronic abnormal left vertebral artery flow void. ORBITS: No significant abnormality. SINUSES AND MASTOIDS: No significant abnormality. BONES AND SOFT TISSUES: Normal marrow signal. No soft tissue abnormality. IMPRESSION: 1. No acute abnormality. 2. Multiple remote infarcts as above. Electronically signed by: Glendia Molt MD 07/07/2024 11:35 PM EST RP Workstation: [id number]     Echo severely depressed ventricular function EF around 2025%  TELEMETRY: Sinus rhythm right bundle branch block nonspecific ST-T changes rate of 60  ASSESSMENT AND PLAN:  Principal  Problem:   Syncope and collapse Active Problems:   ESRD on hemodialysis (HCC)   Chronic hypotension   S/P CABG x 3   Mild dementia associated with other underlying disease, without behavioral disturbance, psychotic disturbance, mood disturbance, or anxiety (HCC)   Anemia of chronic disease   ESRD (end stage renal disease) (HCC)   Paroxysmal atrial fibrillation (HCC)    Hypothyroidism   Chronic combined systolic and diastolic CHF (congestive heart failure) (HCC)   Hepatitis C antibody positive in blood   Other cirrhosis of liver (HCC)   Hypertension   Bipolar disorder in full remission, most recent episode unspecified type   Chronic pain syndrome   Other secondary kyphosis, cervical region   Cervical spondylosis   Fall   Acute pulmonary edema (HCC)   Bacteremia due to Streptococcus   MRSA bacteremia   ESRD (end stage renal disease) on dialysis (HCC)   Acute on chronic respiratory failure with hypoxia (HCC)   Acute on chronic HFrEF (heart failure with reduced ejection fraction) (HCC)   Cardiac arrest with ventricular fibrillation (HCC)    Plan Currently intubated will lower oriented following commands resting comfortably wean off vent when appropriate Broad-spectrum antibiotic therapy for bacteremia Streptococcus End-stage renal disease continue dialysis therapy Acute on chronic respiratory failure hypoxic still on the vent continue supportive pulmonary and critical care management Wide-complex tachycardia related to atrial fibrillation with aberrancy continue amiodarone  therapy Hypertension recurrent syncope related to cardiomyopathy renal failure midodrine  and pressors as necessary wean when appropriate Atrial fibrillation anticoagulation with Eliquis  amiodarone  for rhythm   [name], MD, 07/09/2024 12:02 PM

## 2024-07-09 NOTE — Consult Note (Signed)
 PHARMACY CONSULT NOTE - ELECTROLYTES  Pharmacy Consult for Electrolyte Monitoring and Replacement   Recent Labs: Height: 5' 6 (167.6 cm) Weight: 75.4 kg (166 lb 3.6 oz) IBW/kg (Calculated) : 59.3 Estimated Creatinine Clearance: 17.8 mL/min (A) (by C-G formula based on SCr of 3.45 mg/dL (H)). Potassium (mmol/L)  Date Value  07/09/2024 4.0  07/11/2014 4.7   Magnesium  (mg/dL)  Date Value  98/75/7973 2.1  04/10/2014 1.9   Calcium  (mg/dL)  Date Value  98/74/7973 10.7 (H)   Calcium , Total (mg/dL)  Date Value  98/74/7983 8.3 (L)   Albumin  (g/dL)  Date Value  98/74/7973 2.8 (L)  07/07/2014 3.1 (L)   Phosphorus (mg/dL)  Date Value  98/74/7973 3.1  07/09/2014 3.9   Sodium (mmol/L)  Date Value  07/09/2024 129 (L)  07/09/2014 139   Assessment  [name] is a 64 y.o. female presenting with syncope/in-hospital cardiac arrest. PMH significant for ESRD HD MWF, AF on eliquis , HFrEF, COPD, prior CVA. Pharmacy has been consulted to monitor and replace electrolytes.  Diet: Vital AF at 20 mL/hr + free water  flushes at 30 mL every 4 hours  Goal of Therapy:  Within normal limits  Plan:  Mild hyponatremia, stable Re-check renal function panel tomorrow  Thank you for allowing pharmacy to be a part of this patient's care.  [name] 07/09/2024 11:12 AM

## 2024-07-09 NOTE — Progress Notes (Signed)
 "  NAME:  CECEILIA CEPHUS, MRN:  [id number], DOB:  [birth year], LOS: 7 ADMISSION DATE:  07/01/2024, CHIEF COMPLAINT:  Syncope, V-Fib Arrest  History of Present Illness:  NATINA WIGINTON is a 64 y.o. female with a pmh of of ESRD on HD (MWF), COPD, CHF, bipolar disorder, chronic hypotension on midodrine , HLD and prior CVA who presented to [facility] ED via EMS following syncopal episode.    History was gathered per chart review Per patient, her normal dialysis schedule is MWF.  Patient was recently admitted to the intensive care unit earlier this month July 03, 2024.  At that time she was treated for acute on chronic hypoxemia with circulatory shock since then she has been discharged home and while at home was found down by her family and was brought into the hospital.  After receiving medical management on the mat on the floor and dialysis patient had episode of pulselessness with wide complex tachyarrhythmia.  Rapid response was initiated and ACLS was started on patient.  She received 2 rounds of epinephrine  as well as shock with return of spontaneous circulation.  1 dose of IV amiodarone  was delivered and cardiology was contacted with initiation of amiodarone  infusion.  Patient was brought into the ICU on ventilator and intubated by EDP during CODE BLUE.  Pertinent  Medical History  07/02/24-transferred to ICU post CODE BLUE on medical floor status post ACLS with ROSC  07/03/24- overnight, unresposive with AF. MWF(HD). Will meet with family. Will continue to monitor and re-evaluate post dialysis.  07/04/24- patient had few episodes of VT. She is DNR code status.  She was seen by cardiology, she has poor prognosis. No procedures are planned.  07/05/24- patient had hypotensive episode during HD so it was interuppted.  The lidocaine  infusion will be dcd today and amiodarone  will continue.  Plan for paracentesis today. 07/06/24- patient with no events overnight, did have a non sustained run of VT similar to  previous.  Performing awakening trial today.  Due to staph/strep bacteremia the tunnelled vascCath has to come out.  07/07/24 - started PO amiodarone , MRI brain unremarkable 07/08/24 - more awake and interactive   Significant Hospital Events: Including procedures, antibiotic start and stop dates in addition to other pertinent events   Hypotension & Hypertension HLD HFrEF ESRD on HD Stroke CAD s/p CABG x 3 RBBB Pulmonary hypertension PVD Ischemic cardiomyopathy Hyperkalemia Hypothyroidism Atrial fibrillation on eliquis  Anemia of chronic kidney disease  Interim History / Subjective:  Arouses to verbal and tactile stimuli, does not follow commands. Remains off sedation.  Objective    Blood pressure 116/66, pulse 61, temperature 97.6 F (36.4 C), temperature source Axillary, resp. rate 17, height 5' 6 (1.676 m), weight 75.4 kg, SpO2 98%.    Vent Mode: PRVC FiO2 (%):  [28 %] 28 % Set Rate:  [16 bmp] 16 bmp Vt Set:  [450 mL] 450 mL PEEP:  [5 cmH20] 5 cmH20 Plateau Pressure:  [17 cmH20-18 cmH20] 18 cmH20   Intake/Output Summary (Last 24 hours) at 07/09/2024 9191 Last data filed at 07/09/2024 0700 Gross per 24 hour  Intake 1198.16 ml  Output --  Net 1198.16 ml   Filed Weights   07/07/24 1805 07/08/24 0435 07/09/24 0500  Weight: 75.1 kg 75.3 kg 75.4 kg    Examination: Physical Exam Constitutional:      General: She is not in acute distress.    Appearance: She is ill-appearing.  HENT:     Mouth/Throat:     Comments: ETT  in place Cardiovascular:     Rate and Rhythm: Normal rate and regular rhythm.     Pulses: Normal pulses.     Heart sounds: Normal heart sounds.  Pulmonary:     Comments: Ventilated breath sounds bilaterally Neurological:     Mental Status: She is disoriented.     Comments: Arouses to verbal and tactile stimuli, does not follow commands      Assessment and Plan   #Cardiac Arrest  #Ventricular Fibrillation #CAD  #Ischemic  Cardiomyopathy #Septic Shock  #Gram Positive Bacteremia #COPD #Afib on Eliquis  #ESRD on HD #HyperKalemia  Neurology - remains encephalopathic off sedation. Mental status mildly improved today with more arousability. Will continue to hold all sedation and work towards extubation.   -MRI brain without acute pathology  -continue to hold all psychotropic medications  Cardiovascular - V. Fib arrest for which she was intubated and started on amiodarone  (and previously received lidocaine ). She has atrial fibrillation as well as HFrEF (LVEF 25% with global hypokinesis) and a dysfunctional and enlarged right ventricle.   Amiodarone  switched to PO (200 tid) today, and metoprolol  started yesterday. Coox showed O2 sat of 81% going against decompensated heart failure at the moment. Will need GDMT after extubation. Reportedly with significant tachycardia with nor-epinephrine , resorting to vasopressin  for BP control.   -Amiodarone  200 mg tid  -Metoprolol  12.5 mg bid  -goal MAP > 65 mmHg  -continue IV heparin   Pulmonary - respiratory failure in the setting of cardiac arrest, now intubated and ventilated with minimal ventilator settings. Mental status remains barrier to extubation. Continue to work towards liberation off the vent with daily SBT.  -SBT today  Gastrointestinal - s/p paracentesis (3.5 liters), on tube feeds. Will hold tube feeds for SBT. On PPI for SUP.  Renal - ESRD on HD through a tunneled dialysis catheter. With concern for bacteremia this was explanted yesterday, and she will likley need an HD catheter tomorrow after a line holiday. Will plan ahead with ID, nephrology and vascular surgery for the best approach to obtaining access (temp cath vs tunneled)  Endocrine - ICU glycemic protocol.  Hem/Onc - heparin  gtt given Afib.  ID - gram positive bacteremia with staph gordonii as well as MRSA. Tunneled catheter explanted yesterday. Repeat blood cultures remain negative. Continued on IV  antibiotics. Appreciate input from ID.   Labs   CBC: Recent Labs  Lab 07/04/24 0441 07/05/24 0439 07/06/24 0407 07/07/24 0412 07/09/24 0425  WBC 13.9* 13.7* 15.5* 17.1* 14.2*  HGB 12.1 11.2* 11.7* 11.5* 10.3*  HCT 35.8* 32.6* 33.7* 32.1* 29.0*  MCV 93.0 89.8 89.4 86.5 85.3  PLT 73* 68* 78* 84* 90*    Basic Metabolic Panel: Recent Labs  Lab 07/04/24 0727 07/05/24 0439 07/06/24 0407 07/07/24 0412 07/08/24 0428 07/09/24 0425  NA  --  131* 130* 129* 130* 129*  K 3.9 3.7 4.0 4.1 3.7 4.0  CL  --  91* 90* 89* 89* 87*  CO2  --  25 26 26 26 24   GLUCOSE  --  79 96 108* 97 102*  BUN  --  36* 29* 58* 48* 74*  CREATININE  --  3.97* 2.83* 3.63* 2.51* 3.45*  CALCIUM   --  10.2 10.0 10.9* 9.9 10.7*  MG 1.9 2.4 2.1 2.1 2.1  --   PHOS  --  2.8 2.8 3.0 2.2* 3.1   GFR: Estimated Creatinine Clearance: 17.8 mL/min (A) (by C-G formula based on SCr of 3.45 mg/dL (H)). Recent Labs  Lab 07/02/24 1647 07/02/24  2016 07/03/24 0543 07/05/24 0439 07/06/24 0407 07/07/24 0412 07/09/24 0425  WBC  --   --    < > 13.7* 15.5* 17.1* 14.2*  LATICACIDVEN 5.4* 3.7*  --   --   --   --   --    < > = values in this interval not displayed.    Liver Function Tests: Recent Labs  Lab 07/02/24 1646 07/04/24 0441 07/05/24 0439 07/06/24 0407 07/07/24 0412 07/09/24 0425  AST 54*  --   --   --   --   --   ALT 19  --   --   --   --   --   ALKPHOS 81  --   --   --   --   --   BILITOT 1.4*  --   --   --   --   --   PROT 6.6  --   --   --   --   --   ALBUMIN  3.5 3.1* 2.9* 3.0* 2.8* 2.8*   No results for input(s): LIPASE, AMYLASE in the last 168 hours. No results for input(s): AMMONIA in the last 168 hours.   ABG    Component Value Date/Time   PHART 7.5 (H) 07/02/2024 2058   PCO2ART 33 07/02/2024 2058   PO2ART 202 (H) 07/02/2024 2058   HCO3 25.7 07/02/2024 2058   TCO2 26 04/20/2024 0708   ACIDBASEDEF 0.2 07/01/2024 2155   O2SAT 81.8 07/07/2024 2029     Coagulation Profile: Recent  Labs  Lab 07/05/24 0820  INR 1.2    Cardiac Enzymes: No results for input(s): CKTOTAL, CKMB, CKMBINDEX, TROPONINI in the last 168 hours.  HbA1C: No results found for: HGBA1C  CBG: Recent Labs  Lab 07/08/24 0758 07/08/24 1132 07/08/24 1923 07/08/24 2344 07/09/24 0400  GLUCAP 106* 127* 117* 102* 100*    Review of Systems:   N/A  Past Medical History:  She,  has a past medical history of Acute lacunar infarction (HCC) (03/03/2014), Adult behavior problems, Anemia of chronic renal failure, Aortic atherosclerosis, Arthritis, Atrial fibrillation and flutter (HCC), Bipolar disorder (HCC), Cardiomegaly, Cerebral microvascular disease, CHF (congestive heart failure) (HCC), Chicken pox, Cholelithiasis, Chronic respiratory failure with hypoxia (HCC) (12/19/2021), COPD (chronic obstructive pulmonary disease) (HCC), Coronary artery disease, DDD (degenerative disc disease), cervical, DDD (degenerative disc disease), thoracolumbar, Diverticulosis, Dyspnea, ESRD (end stage renal disease) on dialysis (M,W,F), GERD (gastroesophageal reflux disease), Gout, HCV (hepatitis C virus), Headache, HFrEF (heart failure with reduced ejection fraction) (HCC), History of delirium (04/12/2014), History of substance abuse (HCC), Hyperkalemia (11/13/2014), Hyperlipidemia, Hypertension, Hypotension (02/24/2014), Hypothyroidism, Ischemic cardiomyopathy, Nose colonized with MRSA (02/01/2024), NSTEMI (non-ST elevated myocardial infarction) (HCC) (10/08/2013), NSTEMI (non-ST elevated myocardial infarction) (HCC) (01/23/2002), Obesity, 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, On apixaban  therapy, Peripheral vascular disease, Pneumonia, Postoperative anemia due to acute blood loss (02/27/2014), Postoperative cerebrovascular infarction following cardiac surgery (02/21/2014), Pulmonary hypertension (HCC), RBBB (right bundle branch block), Renal osteodystrophy, S/P CABG x 3 (02/20/2014), Stroke (HCC) (05/23/2010),  and Syncope and collapse (07/25/2020).   Surgical History:   Past Surgical History:  Procedure Laterality Date   A/V FISTULAGRAM Left 03/30/2019   Procedure: A/V FISTULAGRAM;  Surgeon: Marea Selinda RAMAN, MD;  Location: [facility];  Service: Cardiovascular;  Laterality: Left;   A/V FISTULAGRAM Left 10/06/2019   Procedure: A/V FISTULAGRAM;  Surgeon: Marea Selinda RAMAN, MD;  Location: [facility];  Service: Cardiovascular;  Laterality: Left;   A/V FISTULAGRAM Left 05/19/2021   Procedure:  A/V FISTULAGRAM;  Surgeon: Marea Selinda RAMAN, MD;  Location: [facility];  Service: Cardiovascular;  Laterality: Left;   A/V FISTULAGRAM Left 01/29/2022   Procedure: A/V Fistulagram;  Surgeon: Marea Selinda RAMAN, MD;  Location: [facility];  Service: Cardiovascular;  Laterality: Left;   A/V FISTULAGRAM Left 04/30/2022   Procedure: A/V Fistulagram;  Surgeon: Marea Selinda RAMAN, MD;  Location: [facility];  Service: Cardiovascular;  Laterality: Left;   A/V FISTULAGRAM Left 08/20/2022   Procedure: A/V Fistulagram;  Surgeon: Marea Selinda RAMAN, MD;  Location: [facility];  Service: Cardiovascular;  Laterality: Left;   A/V FISTULAGRAM N/A 12/03/2022   Procedure: A/V Fistulagram;  Surgeon: Marea Selinda RAMAN, MD;  Location: [facility];  Service: Cardiovascular;  Laterality: N/A;   A/V FISTULAGRAM Left 02/11/2023   Procedure: A/V Fistulagram;  Surgeon: Marea Selinda RAMAN, MD;  Location: [facility];  Service: Cardiovascular;  Laterality: Left;   A/V FISTULAGRAM Left 02/07/2024   Procedure: A/V Fistulagram;  Surgeon: Marea Selinda RAMAN, MD;  Location: [facility];  Service: Cardiovascular;  Laterality: Left;   COLONOSCOPY     COLONOSCOPY WITH PROPOFOL  N/A 04/22/2021   Procedure: COLONOSCOPY WITH PROPOFOL ;  Surgeon: Maryruth Ole DASEN, MD;  Location: [facility];  Service: Endoscopy;  Laterality: N/A;   CORONARY ANGIOPLASTY WITH STENT PLACEMENT Left 01/25/2002   Procedure: CORONARY  ANGIOPLASTY WITH STENT PLACEMENT; Location: [facility]; Surgeon: Margie Lovelace, MD   CORONARY ARTERY BYPASS GRAFT N/A 02/20/2014   Procedure: CORONARY ARTERY BYPASS GRAFT; Location: [facility]; Surgeon: Reyes Fruits, MD   DIALYSIS FISTULA CREATION     DIALYSIS/PERMA CATHETER INSERTION N/A 04/08/2023   Procedure: DIALYSIS/PERMA CATHETER INSERTION;  Surgeon: Marea Selinda RAMAN, MD;  Location: [facility];  Service: Cardiovascular;  Laterality: N/A;   DIALYSIS/PERMA CATHETER REMOVAL N/A 07/06/2023   Procedure: DIALYSIS/PERMA CATHETER REMOVAL;  Surgeon: [name] Cordella MATSU, MD;  Location: [facility];  Service: Cardiovascular;  Laterality: N/A;   ESOPHAGOGASTRODUODENOSCOPY     ESOPHAGOGASTRODUODENOSCOPY N/A 04/22/2021   Procedure: ESOPHAGOGASTRODUODENOSCOPY (EGD);  Surgeon: Maryruth Ole DASEN, MD;  Location: [facility] END[facility];  Service: Endoscopy;  Laterality: N/A;   FLEXIBLE SIGMOIDOSCOPY N/A 02/23/2019   Procedure: FLEXIBLE SIGMOIDOSCOPY;  Surgeon: Gaylyn Gladis PENNER, MD;  Location: [facility] ENDOSCOPY;  Service: Endoscopy;  Laterality: N/A;   IABP INSERTION  02/20/2014   Procedure: INTRA-AORTIC BALLOON PUMP INSERTION; Location: [facility]; Surgeon: Reyes Fruits, MD   INSERTION OF ARTERIOVENOUS (AV) ARTEGRAFT ARM Right 03/09/2024   Procedure: INSERTION, GRAFT, ARTERIOVENOUS, UPPER EXTREMITY;  Surgeon: Marea Selinda RAMAN, MD;  Location: [facility];  Service: Vascular;  Laterality: Right;  BRACHIAL AXILLARY   INSERTION OF DIALYSIS CATHETER N/A 04/20/2024   Procedure: INSERTION OF DIALYSIS CATHETER;  Surgeon: Marea Selinda RAMAN, MD;  Location: [facility];  Service: Vascular;  Laterality: N/A;   LEFT HEART CATH AND CORONARY ANGIOGRAPHY Left 01/24/2002   Procedure: LEFT HEART CATH AND CORONARY ANGIOGRAPHY; Location: [facility]; Surgeon: Margie Lovelace, MD   LEFT HEART CATH AND CORONARY ANGIOGRAPHY Left 05/25/2012   Procedure: LEFT HEART CATH AND CORONARY ANGIOGRAPHY; Location: [facility]; Surgeon: Margie Lovelace, MD   LEFT HEART CATH AND  CORONARY ANGIOGRAPHY Left 10/09/2013   Procedure: LEFT HEART CATH AND CORONARY ANGIOGRAPHY; Location: [facility]; Surgeon: Vinie Jude, MD   LEFT HEART CATH AND CORS/GRAFTS ANGIOGRAPHY N/A 08/08/2018   Procedure: LEFT HEART CATH AND CORS/GRAFTS ANGIOGRAPHY;  Surgeon: Fernand Denyse LABOR, MD;  Location: [facility];  Service: Cardiovascular;  Laterality: N/A;  LEFT HEART CATH AND CORS/GRAFTS ANGIOGRAPHY N/A 01/30/2019   Procedure: LEFT HEART CATH AND CORS/GRAFTS ANGIOGRAPHY;  Surgeon: Fernand Denyse LABOR, MD;  Location: [facility];  Service: Cardiovascular;  Laterality: N/A;   LIGATION ARTERIOVENOUS GORTEX GRAFT Right 04/20/2024   Procedure: LIGATION ARTERIOVENOUS GORTEX GRAFT;  Surgeon: Marea Selinda RAMAN, MD;  Location: [facility];  Service: Vascular;  Laterality: Right;   LOWER EXTREMITY ANGIOGRAPHY Left 04/24/2024   Procedure: Lower Extremity Angiography;  Surgeon: Marea Selinda RAMAN, MD;  Location: [facility];  Service: Cardiovascular;  Laterality: Left;   ultrasound guided pericardiocentesis     UPPER EXTREMITY ANGIOGRAPHY Right 03/30/2024   Procedure: Upper Extremity Angiography;  Surgeon: Marea Selinda RAMAN, MD;  Location: [facility];  Service: Cardiovascular;  Laterality: Right;     Social History:   reports that she has been smoking cigarettes. She has a 5 pack-year smoking history. She has never used smokeless tobacco. She reports that she does not drink alcohol and does not use drugs.   Family History:  Her family history includes Breast cancer in her maternal aunt and maternal grandmother; Cancer in an other family member; Diabetes type II in her maternal grandmother and mother; Hypertension in her father, mother, sister, and another family member; Kidney disease in her father; Ovarian cancer in her mother; Renal Disease in an other family member.   Allergies Allergies[1]   Home Medications  Prior to Admission medications  Medication Sig Start Date End Date Taking?  Authorizing Provider  albuterol  (VENTOLIN  HFA) 108 (90 Base) MCG/ACT inhaler Inhale 1-2 puffs into the lungs every 6 (six) hours as needed for wheezing or shortness of breath. 01/04/24  Yes Orlean Alan HERO, FNP  apixaban  (ELIQUIS ) 5 MG TABS tablet Take 5 mg by mouth 2 (two) times daily. 09/11/19  Yes [provider]  clobetasol cream (TEMOVATE) 0.05 % Apply 1 Application topically daily. 06/16/24  Yes [provider]  hydrOXYzine  (ATARAX /VISTARIL ) 50 MG tablet Take 50 mg by mouth 2 (two) times daily.   Yes [provider]  levothyroxine  (SYNTHROID ) 75 MCG tablet Take 75 mcg by mouth daily before breakfast.  11/02/18  Yes [provider]  lidocaine -prilocaine  (EMLA ) cream Apply 1 Application topically once. 01/22/21  Yes [provider]  [Paused] metoprolol  succinate (TOPROL -XL) 25 MG 24 hr tablet Take 1 tablet by mouth every morning. Wait to take this until your doctor or other care provider tells you to start again. 08/27/23 08/26/24 Yes [provider]  midodrine  (PROAMATINE ) 10 MG tablet Take 1 tablet (10 mg total) by mouth 3 (three) times daily. 06/27/24  Yes Ponnala, Shruthi, MD  oxyCODONE -acetaminophen  (PERCOCET) 5-325 MG tablet Take 1 tablet by mouth every 6 (six) hours as needed for severe pain (pain score 7-10). 06/27/24  Yes Ponnala, Shruthi, MD  pantoprazole  (PROTONIX ) 40 MG tablet Take 1 tablet (40 mg total) by mouth every morning. 06/27/24  Yes Ponnala, Shruthi, MD  pregabalin (LYRICA) 50 MG capsule Take 50 mg by mouth daily. Patient not taking: Reported on 06/23/2024    [provider]  ranolazine  (RANEXA ) 1000 MG SR tablet Take 1,000 mg by mouth 2 (two) times daily. Patient not taking: Reported on 06/23/2024 03/18/23   [provider]  rosuvastatin  (CRESTOR ) 20 MG tablet Take 20 mg by mouth at bedtime. Patient not taking: Reported on 06/23/2024 11/07/21   [provider]  sevelamer  carbonate (RENVELA ) 800 MG tablet Take  3,200 mg by mouth 3 (three) times daily with meals.  Patient not taking: Reported on 06/23/2024    [provider]  venlafaxine  (EFFEXOR ) 37.5 MG tablet Take 37.5 mg by mouth 2 (two) times daily with a meal. Patient not taking: Reported on 06/23/2024    [provider]     The patient is critically ill due to acute hypoxic respiratory failure, ESRD on HD, MRSA bacteremia, septic shock, V. Fib arrest.  Critical care was necessary to treat or prevent imminent or life-threatening deterioration. I personally performed high risk medication and infusion titration and management, titration, monitoring, and management of vasopressor/ionotrope infusion, and mechanical ventilation management and titration. Critical care time was spent by me on the following activities: development of a treatment plan with the patient and/or surrogate as well as nursing, discussions with consultants, evaluation of the patient's response to treatment, examination of the patient, obtaining a history from the patient or surrogate, ordering and performing treatments and interventions, ordering and review of laboratory studies, ordering and review of radiographic studies, review of telemetry data including pulse oximetry, re-evaluation of patient's condition and participation in multidisciplinary rounds.   I personally spent 40 minutes providing critical care not including any separately billable procedures.   Belva November, MD [facility] 07/09/2024 12:41 PM            [1]  Allergies Allergen Reactions   Morphine  And Codeine Hives   Levaquin [Levofloxacin] Itching and Other (See Comments)    Severe itching; prickly sensation; severe left leg pain; immobility.   Enalapril  Other (See Comments)    hallucinations   Latex Rash   Tape Rash and Other (See Comments)   "

## 2024-07-09 NOTE — Progress Notes (Signed)
 PHARMACY - ANTICOAGULATION CONSULT NOTE  Pharmacy Consult for UFH infusion Indication: atrial fibrillation  Allergies[1]  Patient Measurements: Height: 5' 6 (167.6 cm) Weight: 75.3 kg (166 lb 0.1 oz) IBW/kg (Calculated) : 59.3 HEPARIN  DW (KG): 70.5  Labs: Recent Labs    07/06/24 1309 07/06/24 2107 07/07/24 0412 07/08/24 0428 07/08/24 0915 07/09/24 0425  HGB  --   --  11.5*  --   --  10.3*  HCT  --   --  32.1*  --   --  29.0*  PLT  --   --  84*  --   --  90*  APTT 86* 72* 78*  --   --   --   HEPARINUNFRC  --   --  0.46  --  0.39 0.41  CREATININE  --   --  3.63* 2.51*  --   --     Estimated Creatinine Clearance: 24.4 mL/min (A) (by C-G formula based on SCr of 2.51 mg/dL (H)).   Medical History: Past Medical History:  Diagnosis Date   Acute lacunar infarction (HCC) 03/03/2014   a.) MRI brain 03/03/2014: two acute punctate lacunar infarcts anteriorly in the BILATERAL centrum semiovale   Adult behavior problems    Anemia of chronic renal failure    Aortic atherosclerosis    Arthritis    Atrial fibrillation and flutter (HCC)    a.) CHA2DS2VASc = 6 (sex, HFrEF, HTN, CVA x 2, prior MI/vascular disease) as of 02/04/2024; b.) rate/rhythm maintained on oral metoprolol  succinate; chronically anticoagulated with apixaban    Bipolar disorder (HCC)    Cardiomegaly    Cerebral microvascular disease    CHF (congestive heart failure) (HCC)    Chicken pox    Cholelithiasis    Chronic respiratory failure with hypoxia (HCC) 12/19/2021   COPD (chronic obstructive pulmonary disease) (HCC)    Coronary artery disease    DDD (degenerative disc disease), cervical    DDD (degenerative disc disease), thoracolumbar    Diverticulosis    Dyspnea    ESRD (end stage renal disease) on dialysis (M,W,F)    GERD (gastroesophageal reflux disease)    Gout    HCV (hepatitis C virus)    Headache    HFrEF (heart failure with reduced ejection fraction) (HCC)    History of delirium 04/12/2014    History of substance abuse (HCC)    a.) tobacco + cocaine   Hyperkalemia 11/13/2014   Hyperlipidemia    Hypertension    Hypotension 02/24/2014   Hypothyroidism    Ischemic cardiomyopathy    Nose colonized with MRSA 02/01/2024   a.) presurgical PCR (+) on 02/01/2024 prior to A/V FISTULAGRAM; b.) presurgical PCR (+) on 03/07/2024 prior to AV FISTULA CREATION   NSTEMI (non-ST elevated myocardial infarction) (HCC) 10/08/2013   NSTEMI (non-ST elevated myocardial infarction) (HCC) 01/23/2002   a.) LHC 01/24/2002 --> multi-vessel CAD with IRA being 90% OM2 --> delayed PCI until 01/25/2002 --> 3.0 x 23 mm BX Velocity stent   Obesity    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    On apixaban  therapy    Peripheral vascular disease    Pneumonia    Postoperative anemia due to acute blood loss 02/27/2014   Postoperative cerebrovascular infarction following cardiac surgery 02/21/2014   a.) developed RIGHT upper/lower extremity weakness following cardiac revascularization --> neuro consult and head CT --> Hypodensity noted in the area of the LEFT motor cortex consistent with a component of acute ischemia   Pulmonary hypertension (HCC)  RBBB (right bundle branch block)    Renal osteodystrophy    S/P CABG x 3 02/20/2014   a.) LIMA-LAD, SVG-OM1, SVG-PDA   Stroke (HCC) 05/23/2010   a.) brain MRI 05/23/2010: BILATERAL acute infarcts and old prior infarcts; RIGHT frontal lobe extending into the white matter. Small focus of restricted diffusion in the cortex of the medial LEFT frontoparietal lobe. Periventricular T2 and multiple foci of T2 hyperintensity in the subcortical white matter and increased FLAIR signal in the cortex of the LEFT frontal lobe.   Syncope and collapse 07/25/2020   Assessment: 64 y/o F with a h/o atrial fibrillation on apixaban . Last dose was 1/18 at 0900. Patient coded on Jul 14, 2024 with subsequent ROSC and is now in the ICU on ventilation and pressor support. Pharmacy consulted  for IV heparin .   Goal of Therapy:  anti-Xa level  0.3-0.7 units/ml Monitor platelets by anticoagulation protocol: Yes   Plan:  --HL therapeutic x 4 --Continue heparin  infusion at 1450 units/hr --Re-check HL daily w/ AM labs while therapeutic --Daily CBC  [name], PharmD, [facility] 07/09/2024 5:03 AM      [1]  Allergies Allergen Reactions   Morphine  And Codeine Hives   Levaquin [Levofloxacin] Itching and Other (See Comments)    Severe itching; prickly sensation; severe left leg pain; immobility.   Enalapril  Other (See Comments)    hallucinations   Latex Rash   Tape Rash and Other (See Comments)

## 2024-07-09 NOTE — Progress Notes (Addendum)
 "                                                                                                                                                                                               Palliative Care Progress Note, Assessment & Plan   Patient Name: [name]       Date: 07/09/2024 DOB: 1960/11/24  Age: 64 y.o. MRN#: [id number] Attending Physician: Isadora Hose, MD Primary Care Physician: Orlean Alan HERO, FNP Admit Date: 07/01/2024  Subjective: Nods head to question of pain but unable to state location d/t being intubated  HPI: 64 y.o. female with past medical history significant for ESRD on HD (M,W,F), COPD, HFrEF (25-30%), bipolar disorder, chronic hypotension on midodrine . Patient presented to ED 07/01/2024 after being found down at her home by brother.    In the ED, patient was found to sleepy but arousable. She was not sure how she ended up on floor. Brother at bedside reported he was the one that found her and is worried since she was here recently with the same complaint.    ED labs significant for WBC 12, platelets 82. Anion gap 26, BUN 28, creatinine 5.07, and GFR 9 (ESRD on HD).Calcium  10.4. Troponin 155 (around baseline).    ED vitals 116/55, HR 88, RR 18, SpO2 100%, 98.98F   CXR  1. Pulmonary interstitial prominence with mild pulmonary edema, similar . 2. Decreased left pleural effusion. 3. Cardiomegaly.   DR wrist, pelvis negative for fracture.   CT Head 1. No acute intracranial abnormality. 2. Chronic encephalomalacia within bilateral frontal, parietal, and right occipital lobes compatible with remote infarcts. 3. Extensive periventricular and subcortical white matter low-density changes compatible with chronic microvascular ischemic change.   CT cervical spine 1. No acute cervical spine findings. 2. Stable chronic endplate changes and focal kyphosis at C5-6.   TRH was consulted for admission and management of syncope with fall, ESRD on HD, lethargy,  HFrEF, afib on Eliquis , elevated troponin, chronic hypotension on midodrine  leukocytosis and chronic L heel ulcer.    Of note, patient had recent admission 06/23/23 for similar complaint of syncope and was admitted for fluid overload, hypotension and  cardiogenic shock requiring pressors.   Shortly after patient underwent dialysis, CODE BLUE was activated after patient went unresponsive. She was found to be in V-fib arrest. Patient received 2 rounds epi, calcium , bicarb and shock with ROSC. She was intubated and transferred to ICU.    Palliative medicine team was consulted for assistance with goals of care conversations.    Patient is well-known to PMT after recent consults and visits dated 06/24/24-  06/26/24 and 07/05/24- 07/06/24.   Summary of counseling/coordination of care Chart reviewed:   Leukocytosis improved from yesterday. Hgb slightly lower. Hyponatremic. Renal function expected with ESRD on HD patient.     Latest Ref Rng & Units 07/09/2024    4:25 AM 07/07/2024    4:12 AM 07/06/2024    4:07 AM  CBC  WBC 4.0 - 10.5 K/uL 14.2  17.1  15.5   Hemoglobin 12.0 - 15.0 g/dL 89.6  88.4  88.2   Hematocrit 36.0 - 46.0 % 29.0  32.1  33.7   Platelets 150 - 400 K/uL 90  84  78       Latest Ref Rng & Units 07/09/2024    4:25 AM 07/08/2024    4:28 AM 07/07/2024    4:12 AM  CMP  Glucose 70 - 99 mg/dL 897  97  891   BUN 8 - 23 mg/dL 74  48  58   Creatinine 0.44 - 1.00 mg/dL 6.54  7.48  6.36   Sodium 135 - 145 mmol/L 129  130  129   Potassium 3.5 - 5.1 mmol/L 4.0  3.7  4.1   Chloride 98 - 111 mmol/L 87  89  89   CO2 22 - 32 mmol/L 24  26  26    Calcium  8.9 - 10.3 mg/dL 89.2  9.9  89.0      Blood pressure 110/66, pulse 60, temperature 97.9 F (36.6 C), temperature source Oral, resp. rate 16, height 5' 6 (1.676 m), weight 75.4 kg, SpO2 98%.  Estimated body mass index is 26.83 kg/m as calculated from the following:   Height as of this encounter: 5' 6 (1.676 m).   Weight as of this encounter:  75.4 kg.   Patient Active Problem List   Diagnosis Date Noted   Cardiac arrest with ventricular fibrillation (HCC) 07/08/2024   Acute on chronic respiratory failure with hypoxia (HCC) 07/07/2024   Acute on chronic HFrEF (heart failure with reduced ejection fraction) (HCC) 07/07/2024   MRSA bacteremia 07/06/2024   ESRD (end stage renal disease) on dialysis (HCC) 07/06/2024   Fall 07/05/2024   Acute pulmonary edema (HCC) 07/05/2024   Bacteremia due to Streptococcus 07/05/2024   Other secondary kyphosis, cervical region 07/03/2024   Cervical spondylosis 07/03/2024   Syncope and collapse 07/01/2024   Syncope 06/22/2024   Acute idiopathic gout of left foot 04/13/2024   Bunion of great toe 04/13/2024   Other psychoactive substance abuse, uncomplicated (HCC) 03/15/2024   Bipolar disorder in full remission, most recent episode unspecified type 03/15/2024   Chronic pain syndrome 03/15/2024   De Quervain's tenosynovitis, right 07/06/2023   NPDH (new persistent daily headache) 07/06/2023   Chest pain 05/17/2023   Hypertension    Physical deconditioning 03/27/2023   Hallux valgus of right foot 01/07/2023   Pain of left lower extremity 01/07/2023   Peripheral vascular disease of lower extremity 01/02/2023   Other cirrhosis of liver (HCC) 10/08/2022   Ascites 12/19/2021   Hepatitis C antibody positive in blood 12/19/2021   Thrombocytopenia 08/08/2021   Paroxysmal atrial fibrillation (HCC)    Hypothyroidism    ESRD (end stage renal disease) (HCC) 01/30/2019   Swelling of limb 09/22/2016   Generalized weakness 10/15/2014   ESRD on hemodialysis (HCC) 10/15/2014   Chronic hypotension 10/15/2014   Chronic combined systolic and diastolic CHF (congestive heart failure) (HCC) 10/15/2014   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   Pericardial effusion 04/12/2014   Chest wall pain following surgery 04/12/2014  Lactic acidosis 03/26/2014   Mild dementia associated with other underlying disease,  without behavioral disturbance, psychotic disturbance, mood disturbance, or anxiety (HCC) 03/25/2014   Atrial flutter (HCC) 03/22/2014   Superficial incisional surgical site infection 03/14/2014   Exhausted vascular access 02/24/2014   Anemia of chronic disease 02/24/2014   S/P CABG x 3 02/21/2014   Hyperlipidemia 02/13/2014   History of hepatitis C 02/13/2014   HFrEF (heart failure with reduced ejection fraction) (HCC) 02/13/2014   Retrosternal chest pain 05/30/2013   Gallstones 05/29/2013     Progress notes: Reviewed progress notes from [facility], PCCM, vascular surgery, PMT, RT, nephrology and nursing staff.   Imaging: No new imaging  MAR:  No changes to [facility]  ACP documents: None to review  After reviewing the patient's chart and assessing the patient at bedside, I spoke with patient in regards to symptom management and goals of care.   Ill-appearing female lying in bed mechanically ventilated, not on sedation. She acknowledges my presence and smiles. She nods her head appropriately  to yes and no questions. Unable to assess orientation. No visitors at bedside. She is in no distress.   Discussed plan of care with Burnard, primary RN, with plan to do SBT later today. Dialysis catheter was removed yesterday due to staph/strep bacteremia with plan to replace after 48 hour of abx. Following nephrology for plan. Patient no longer requiring pressors for BP support.   Physical Exam Vitals reviewed.  Constitutional:      General: She is not in acute distress.    Appearance: She is ill-appearing.  HENT:     Head: Normocephalic and atraumatic.     Mouth/Throat:     Mouth: Mucous membranes are dry.     Comments: ET/OG in place  Pulmonary:     Effort: No respiratory distress.     Comments: Mechanically ventilated  Musculoskeletal:     Right lower leg: No edema.     Left lower leg: Edema present.     Comments: Dressing to L foot wound  Skin:    General: Skin is warm and dry.  Neurological:      Mental Status: She is alert.     Motor: Weakness present.    Recommendations/Plan: DNR Continue current supportive interventions Family wants to allow a few days for outcomes Palliative will follow up for continuing GOC discussions          I personally spent a total of 50 minutes in the care of the patient today including preparing to see the patient, getting/reviewing separately obtained history, performing a medically appropriate exam/evaluation, counseling and educating, referring and communicating with other health care professionals, documenting clinical information in the EHR, independently interpreting results, communicating results, and coordinating care.    Devere Sacks, AMANDA [facility] Palliative Medicine Team  07/09/2024 9:34 AM  Office [phone]  Pager [phone]     "

## 2024-07-09 NOTE — Plan of Care (Signed)
 Continuing with plan of care.

## 2024-07-09 NOTE — Progress Notes (Signed)
 " [facility]  PROGRESS NOTE   Subjective:   Patient seen and evaluated at bedside in the critical care unit this a.m. Patient remains intubated. Case discussed with critical care. They removed PermCath yesterday.   Objective:  Vital signs: Blood pressure 110/66, pulse 60, temperature 97.9 F (36.6 C), temperature source Oral, resp. rate 16, height 5' 6 (1.676 m), weight 75.4 kg, SpO2 98%.  Intake/Output Summary (Last 24 hours) at 07/09/2024 1010 Last data filed at 07/09/2024 0700 Gross per 24 hour  Intake 1095.86 ml  Output --  Net 1095.86 ml   Filed Weights   07/07/24 1805 07/08/24 0435 07/09/24 0500  Weight: 75.1 kg 75.3 kg 75.4 kg     Physical Exam: General:  No acute distress  Head:  Normocephalic, atraumatic.  ET tube, OG tube in place  Eyes:  Anicteric  Lungs:   Clear to auscultation, ventilator assisted  Heart:  S1S2 no rubs  Abdomen:   Soft, distended, hypoactive bowel sounds  Extremities:  + peripheral edema.  Neurologic:  Arousable  Skin:  Left foot wound  Access:  Right IJ dialysis catheter removed 07/08/2024    Basic Metabolic Panel: Recent Labs  Lab 07/04/24 0727 07/05/24 0439 07/06/24 0407 07/07/24 0412 07/08/24 0428 07/09/24 0425  NA  --  131* 130* 129* 130* 129*  K 3.9 3.7 4.0 4.1 3.7 4.0  CL  --  91* 90* 89* 89* 87*  CO2  --  25 26 26 26 24   GLUCOSE  --  79 96 108* 97 102*  BUN  --  36* 29* 58* 48* 74*  CREATININE  --  3.97* 2.83* 3.63* 2.51* 3.45*  CALCIUM   --  10.2 10.0 10.9* 9.9 10.7*  MG 1.9 2.4 2.1 2.1 2.1  --   PHOS  --  2.8 2.8 3.0 2.2* 3.1   GFR: Estimated Creatinine Clearance: 17.8 mL/min (A) (by C-G formula based on SCr of 3.45 mg/dL (H)).  Liver Function Tests: Recent Labs  Lab 07/02/24 1646 07/04/24 0441 07/05/24 0439 07/06/24 0407 07/07/24 0412 07/09/24 0425  AST 54*  --   --   --   --   --   ALT 19  --   --   --   --   --   ALKPHOS 81  --   --   --   --   --   BILITOT 1.4*  --   --   --   --   --    PROT 6.6  --   --   --   --   --   ALBUMIN  3.5 3.1* 2.9* 3.0* 2.8* 2.8*   No results for input(s): LIPASE, AMYLASE in the last 168 hours. No results for input(s): AMMONIA in the last 168 hours.   CBC: Recent Labs  Lab 07/04/24 0441 07/05/24 0439 07/06/24 0407 07/07/24 0412 07/09/24 0425  WBC 13.9* 13.7* 15.5* 17.1* 14.2*  HGB 12.1 11.2* 11.7* 11.5* 10.3*  HCT 35.8* 32.6* 33.7* 32.1* 29.0*  MCV 93.0 89.8 89.4 86.5 85.3  PLT 73* 68* 78* 84* 90*     HbA1C: No results found for: HGBA1C  Urinalysis: No results for input(s): COLORURINE, LABSPEC, PHURINE, GLUCOSEU, HGBUR, BILIRUBINUR, KETONESUR, PROTEINUR, UROBILINOGEN, NITRITE, LEUKOCYTESUR in the last 72 hours.  Invalid input(s): APPERANCEUR    Imaging: MR BRAIN WO CONTRAST Result Date: 07/07/2024 EXAM: MRI BRAIN WITHOUT CONTRAST 07/07/2024 11:00:58 PM TECHNIQUE: Multiplanar multisequence MRI of the head/brain was performed without the administration of intravenous contrast. COMPARISON: MRI  head August 16, 2023 CLINICAL HISTORY: Encephalopathy. FINDINGS: BRAIN AND VENTRICLES: No acute infarct. Multifocal remote encephalomalacia with surrounding gliosis in the bilateral frontal lobes, right parietooccipital region, left parietal lobe, and cerebellum. No intracranial hemorrhage. No mass. No midline shift. No hydrocephalus. The sella is unremarkable. Chronic abnormal left vertebral artery flow void. ORBITS: No significant abnormality. SINUSES AND MASTOIDS: No significant abnormality. BONES AND SOFT TISSUES: Normal marrow signal. No soft tissue abnormality. IMPRESSION: 1. No acute abnormality. 2. Multiple remote infarcts as above. Electronically signed by: Glendia Molt MD 07/07/2024 11:35 PM EST RP Workstation: [id number]      Medications:    cefTRIAXone  (ROCEPHIN )  IV Stopped (07/08/24 2037)   heparin  1,450 Units/hr (07/09/24 0700)   propofol  (DIPRIVAN ) infusion Stopped (07/06/24 9191)   vancomycin   Stopped (07/07/24 1746)   vasopressin  0.01 Units/min (07/09/24 0700)    amiodarone   200 mg Per Tube TID   Chlorhexidine  Gluconate Cloth  6 each Topical Daily   collagenase   1 Application Topical Daily   feeding supplement (PROSource TF20)  60 mL Per Tube Daily   feeding supplement (VITAL AF 1.2 CAL)  1,000 mL Per Tube Q24H   free water   30 mL Per Tube Q4H   levothyroxine   75 mcg Per Tube Q0600   metoprolol  tartrate  12.5 mg Per Tube BID   midodrine   10 mg Per Tube TID WC   multivitamin  1 tablet Per Tube QHS   nutrition supplement (JUVEN)  1 packet Per Tube BID BM   mouth rinse  15 mL Mouth Rinse Q2H   pantoprazole  (PROTONIX ) IV  40 mg Intravenous Daily   sodium chloride  flush  10-40 mL Intracatheter Q12H   venlafaxine   37.5 mg Per Tube BID WC    Assessment/ Plan:     64 year old female with history of coronary disease history of CABG, ESRD, COPD, atrial fibrillation on hemodialysis Monday Wednesday and Friday now admitted with history of syncope/fall.  In hospital cardiac arrest on Sunday, 07/02/2024 Outpatient dialysis: [facility]/MWF/right PermCath/72.5 kg    ESRD: HD Monday Wednesday and Friday.  Patient completed dialysis treatment last on Friday.  PermCath now removed.  Keep catheter free for at least 2 days.  Would recommend ID consultation as well.  ANEMIA of chronic kidney disease: Hemoglobin 10.3.  Continue to monitor.   MBD (secondary hyperparathyroidism): Maintain the patient on sodium thiosulfate  for calciphylaxis of the left foot.   Hypotension/VOL/edema: Off pressors now.  Continues to have A-fib with RVR.  Cardiology team following along for chronic systolic CHF.  Transition to p.o. amiodarone .  Also on vasopressin  at the moment.  Acute respiratory failure-requiring ventilator postcardiac arrest on 07/02/2024.  Continue vent support at this time.    Labs and medications reviewed. Will continue to follow along with you.   LOS: 7 Adonis Yim,  MD [facility] 1/25/202610:10 AM  "

## 2024-07-09 NOTE — Progress Notes (Signed)
 SBT completed earlier in the shift, patient became asynchronis with the vent and presented with increased work of breathing.

## 2024-07-09 NOTE — Plan of Care (Signed)
" °  Problem: Education: Goal: Knowledge of General Education information will improve Description: Including pain rating scale, medication(s)/side effects and non-pharmacologic comfort measures Outcome: Not Progressing   Problem: Health Behavior/Discharge Planning: Goal: Ability to manage health-related needs will improve Outcome: Not Progressing   Problem: Clinical Measurements: Goal: Ability to maintain clinical measurements within normal limits will improve Outcome: Progressing Goal: Respiratory complications will improve Outcome: Progressing Goal: Cardiovascular complication will be avoided Outcome: Progressing   Problem: Nutrition: Goal: Adequate nutrition will be maintained Outcome: Progressing   Problem: Coping: Goal: Level of anxiety will decrease Outcome: Progressing   "

## 2024-07-10 ENCOUNTER — Inpatient Hospital Stay

## 2024-07-10 DIAGNOSIS — R6521 Severe sepsis with septic shock: Secondary | ICD-10-CM | POA: Diagnosis not present

## 2024-07-10 DIAGNOSIS — I255 Ischemic cardiomyopathy: Secondary | ICD-10-CM | POA: Diagnosis not present

## 2024-07-10 DIAGNOSIS — D696 Thrombocytopenia, unspecified: Secondary | ICD-10-CM | POA: Diagnosis not present

## 2024-07-10 DIAGNOSIS — B9562 Methicillin resistant Staphylococcus aureus infection as the cause of diseases classified elsewhere: Secondary | ICD-10-CM | POA: Diagnosis not present

## 2024-07-10 DIAGNOSIS — R7881 Bacteremia: Secondary | ICD-10-CM | POA: Diagnosis not present

## 2024-07-10 DIAGNOSIS — A419 Sepsis, unspecified organism: Secondary | ICD-10-CM | POA: Diagnosis not present

## 2024-07-10 DIAGNOSIS — R55 Syncope and collapse: Secondary | ICD-10-CM | POA: Diagnosis not present

## 2024-07-10 DIAGNOSIS — Z992 Dependence on renal dialysis: Secondary | ICD-10-CM | POA: Diagnosis not present

## 2024-07-10 DIAGNOSIS — B954 Other streptococcus as the cause of diseases classified elsewhere: Secondary | ICD-10-CM | POA: Diagnosis not present

## 2024-07-10 DIAGNOSIS — Z930 Tracheostomy status: Secondary | ICD-10-CM

## 2024-07-10 DIAGNOSIS — N186 End stage renal disease: Secondary | ICD-10-CM | POA: Diagnosis not present

## 2024-07-10 DIAGNOSIS — I5082 Biventricular heart failure: Secondary | ICD-10-CM | POA: Diagnosis not present

## 2024-07-10 DIAGNOSIS — I469 Cardiac arrest, cause unspecified: Secondary | ICD-10-CM | POA: Diagnosis not present

## 2024-07-10 DIAGNOSIS — I2510 Atherosclerotic heart disease of native coronary artery without angina pectoris: Secondary | ICD-10-CM | POA: Diagnosis not present

## 2024-07-10 DIAGNOSIS — R188 Other ascites: Secondary | ICD-10-CM | POA: Diagnosis not present

## 2024-07-10 LAB — GLUCOSE, CAPILLARY
Glucose-Capillary: 100 mg/dL — ABNORMAL HIGH (ref 70–99)
Glucose-Capillary: 112 mg/dL — ABNORMAL HIGH (ref 70–99)
Glucose-Capillary: 113 mg/dL — ABNORMAL HIGH (ref 70–99)
Glucose-Capillary: 71 mg/dL (ref 70–99)
Glucose-Capillary: 87 mg/dL (ref 70–99)
Glucose-Capillary: 90 mg/dL (ref 70–99)
Glucose-Capillary: 96 mg/dL (ref 70–99)

## 2024-07-10 LAB — RENAL FUNCTION PANEL
Albumin: 2.9 g/dL — ABNORMAL LOW (ref 3.5–5.0)
Anion gap: 20 — ABNORMAL HIGH (ref 5–15)
BUN: 94 mg/dL — ABNORMAL HIGH (ref 8–23)
CO2: 21 mmol/L — ABNORMAL LOW (ref 22–32)
Calcium: 10.7 mg/dL — ABNORMAL HIGH (ref 8.9–10.3)
Chloride: 86 mmol/L — ABNORMAL LOW (ref 98–111)
Creatinine, Ser: 4.01 mg/dL — ABNORMAL HIGH (ref 0.44–1.00)
GFR, Estimated: 12 mL/min — ABNORMAL LOW
Glucose, Bld: 106 mg/dL — ABNORMAL HIGH (ref 70–99)
Phosphorus: 3.6 mg/dL (ref 2.5–4.6)
Potassium: 4.1 mmol/L (ref 3.5–5.1)
Sodium: 126 mmol/L — ABNORMAL LOW (ref 135–145)

## 2024-07-10 LAB — CBC
HCT: 29.1 % — ABNORMAL LOW (ref 36.0–46.0)
Hemoglobin: 10.2 g/dL — ABNORMAL LOW (ref 12.0–15.0)
MCH: 30.4 pg (ref 26.0–34.0)
MCHC: 35.1 g/dL (ref 30.0–36.0)
MCV: 86.6 fL (ref 80.0–100.0)
Platelets: 100 10*3/uL — ABNORMAL LOW (ref 150–400)
RBC: 3.36 MIL/uL — ABNORMAL LOW (ref 3.87–5.11)
RDW: 20.9 % — ABNORMAL HIGH (ref 11.5–15.5)
WBC: 12.4 10*3/uL — ABNORMAL HIGH (ref 4.0–10.5)
nRBC: 0.2 % (ref 0.0–0.2)

## 2024-07-10 LAB — HEPARIN LEVEL (UNFRACTIONATED): Heparin Unfractionated: 0.5 [IU]/mL (ref 0.30–0.70)

## 2024-07-10 MED ORDER — VANCOMYCIN VARIABLE DOSE PER UNSTABLE RENAL FUNCTION (PHARMACIST DOSING): Status: DC

## 2024-07-10 MED ORDER — ORAL CARE MOUTH RINSE: 15.0000 mL | OROMUCOSAL | Status: AC | PRN

## 2024-07-10 MED ORDER — VASOPRESSIN 20 UNITS/100 ML INFUSION FOR SHOCK
0.0000 [IU]/min | INTRAVENOUS | Status: DC
  Administered 2024-07-11: 0.01 [IU]/min via INTRAVENOUS
  Filled 2024-07-10 (×2): qty 100

## 2024-07-10 NOTE — Progress Notes (Signed)
 PHARMACY - ANTICOAGULATION CONSULT NOTE  Pharmacy Consult for UFH infusion Indication: atrial fibrillation  Allergies[1]  Patient Measurements: Height: 5' 6 (167.6 cm) Weight: 78.7 kg (173 lb 8 oz) IBW/kg (Calculated) : 59.3 HEPARIN  DW (KG): 70.5  Labs: Recent Labs    07/08/24 0428 07/08/24 0915 07/09/24 0425 07/10/24 0412  HGB  --   --  10.3* 10.2*  HCT  --   --  29.0* 29.1*  PLT  --   --  90* 100*  HEPARINUNFRC  --  0.39 0.41 0.50  CREATININE 2.51*  --  3.45* 4.01*    Estimated Creatinine Clearance: 15.6 mL/min (A) (by C-G formula based on SCr of 4.01 mg/dL (H)).   Medical History: Past Medical History:  Diagnosis Date   Acute lacunar infarction (HCC) 03/03/2014   a.) MRI brain 03/03/2014: two acute punctate lacunar infarcts anteriorly in the BILATERAL centrum semiovale   Adult behavior problems    Anemia of chronic renal failure    Aortic atherosclerosis    Arthritis    Atrial fibrillation and flutter (HCC)    a.) CHA2DS2VASc = 6 (sex, HFrEF, HTN, CVA x 2, prior MI/vascular disease) as of 02/04/2024; b.) rate/rhythm maintained on oral metoprolol  succinate; chronically anticoagulated with apixaban    Bipolar disorder (HCC)    Cardiomegaly    Cerebral microvascular disease    CHF (congestive heart failure) (HCC)    Chicken pox    Cholelithiasis    Chronic respiratory failure with hypoxia (HCC) 12/19/2021   COPD (chronic obstructive pulmonary disease) (HCC)    Coronary artery disease    DDD (degenerative disc disease), cervical    DDD (degenerative disc disease), thoracolumbar    Diverticulosis    Dyspnea    ESRD (end stage renal disease) on dialysis (M,W,F)    GERD (gastroesophageal reflux disease)    Gout    HCV (hepatitis C virus)    Headache    HFrEF (heart failure with reduced ejection fraction) (HCC)    History of delirium 04/12/2014   History of substance abuse (HCC)    a.) tobacco + cocaine   Hyperkalemia 11/13/2014   Hyperlipidemia     Hypertension    Hypotension 02/24/2014   Hypothyroidism    Ischemic cardiomyopathy    Nose colonized with MRSA 02/01/2024   a.) presurgical PCR (+) on 02/01/2024 prior to A/V FISTULAGRAM; b.) presurgical PCR (+) on 03/07/2024 prior to AV FISTULA CREATION   NSTEMI (non-ST elevated myocardial infarction) (HCC) 10/08/2013   NSTEMI (non-ST elevated myocardial infarction) (HCC) 01/23/2002   a.) LHC 01/24/2002 --> multi-vessel CAD with IRA being 90% OM2 --> delayed PCI until 01/25/2002 --> 3.0 x 23 mm BX Velocity stent   Obesity    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    On apixaban  therapy    Peripheral vascular disease    Pneumonia    Postoperative anemia due to acute blood loss 02/27/2014   Postoperative cerebrovascular infarction following cardiac surgery 02/21/2014   a.) developed RIGHT upper/lower extremity weakness following cardiac revascularization --> neuro consult and head CT --> Hypodensity noted in the area of the LEFT motor cortex consistent with a component of acute ischemia   Pulmonary hypertension (HCC)    RBBB (right bundle branch block)    Renal osteodystrophy    S/P CABG x 3 02/20/2014   a.) LIMA-LAD, SVG-OM1, SVG-PDA   Stroke (HCC) 05/23/2010   a.) brain MRI 05/23/2010: BILATERAL acute infarcts and old prior infarcts; RIGHT frontal lobe extending into the white matter.  Small focus of restricted diffusion in the cortex of the medial LEFT frontoparietal lobe. Periventricular T2 and multiple foci of T2 hyperintensity in the subcortical white matter and increased FLAIR signal in the cortex of the LEFT frontal lobe.   Syncope and collapse 07/25/2020   Assessment: 64 y/o F with a h/o atrial fibrillation on apixaban . Last dose was 1/18 at 0900. Patient coded on 07-13-24 with subsequent ROSC and is now in the ICU on ventilation and pressor support. Pharmacy consulted for IV heparin .   Goal of Therapy:  anti-Xa level  0.3-0.7 units/ml Monitor platelets by anticoagulation  protocol: Yes   Plan:  --HL therapeutic x 5 --Continue heparin  infusion at 1450 units/hr --Re-check HL daily w/ AM labs while therapeutic --Daily CBC  Rankin CANDIE Dills, PharmD, [facility] 07/10/2024 6:26 AM       [1]  Allergies Allergen Reactions   Morphine  And Codeine Hives   Levaquin [Levofloxacin] Itching and Other (See Comments)    Severe itching; prickly sensation; severe left leg pain; immobility.   Enalapril  Other (See Comments)    hallucinations   Latex Rash   Tape Rash and Other (See Comments)

## 2024-07-10 NOTE — Progress Notes (Addendum)
 "                                                                                                                                                                                               Palliative Care Progress Note, Assessment & Plan   Patient Name: [name]       Date: 07/10/2024 DOB: 09-Dec-1960  Age: 64 y.o. MRN#: [id number] Attending Physician: Isadora Hose, MD Primary Care Physician: Orlean Alan HERO, FNP Admit Date: 07/01/2024  Subjective: Patient nods head no to question of pain.  Also denies shortness of breath or chest pain.  HPI: 64 y.o. female with past medical history significant for ESRD on HD (M,W,F), COPD, HFrEF (25-30%), bipolar disorder, chronic hypotension on midodrine . Patient presented to ED 07/01/2024 after being found down at her home by brother.    In the ED, patient was found to sleepy but arousable. She was not sure how she ended up on floor. Brother at bedside reported he was the one that found her and is worried since she was here recently with the same complaint.    ED labs significant for WBC 12, platelets 82. Anion gap 26, BUN 28, creatinine 5.07, and GFR 9 (ESRD on HD).Calcium  10.4. Troponin 155 (around baseline).    ED vitals 116/55, HR 88, RR 18, SpO2 100%, 98.37F   CXR  1. Pulmonary interstitial prominence with mild pulmonary edema, similar . 2. Decreased left pleural effusion. 3. Cardiomegaly.   DR wrist, pelvis negative for fracture.   CT Head 1. No acute intracranial abnormality. 2. Chronic encephalomalacia within bilateral frontal, parietal, and right occipital lobes compatible with remote infarcts. 3. Extensive periventricular and subcortical white matter low-density changes compatible with chronic microvascular ischemic change.   CT cervical spine 1. No acute cervical spine findings. 2. Stable chronic endplate changes and focal kyphosis at C5-6.   TRH was consulted for admission and management of syncope with fall, ESRD on HD,  lethargy, HFrEF, afib on Eliquis , elevated troponin, chronic hypotension on midodrine  leukocytosis and chronic L heel ulcer.    Of note, patient had recent admission 06/23/23 for similar complaint of syncope and was admitted for fluid overload, hypotension and  cardiogenic shock requiring pressors.   Shortly after patient underwent dialysis, CODE BLUE was activated after patient went unresponsive. She was found to be in V-fib arrest. Patient received 2 rounds epi, calcium , bicarb and shock with ROSC. She was intubated and transferred to ICU.    Palliative medicine team was consulted for assistance with goals of care conversations.    Patient is well-known to PMT after recent consults and  visits dated 06/24/24- 06/26/24 and 07/05/24- 07/06/24.     Summary of counseling/coordination of care Chart reviewed: WBC trending down.  Hemoglobin stable.  Renal function expected with HD patient.    Latest Ref Rng & Units 07/10/2024    4:12 AM 07/09/2024    4:25 AM 07/07/2024    4:12 AM  CBC  WBC 4.0 - 10.5 K/uL 12.4  14.2  17.1   Hemoglobin 12.0 - 15.0 g/dL 89.7  89.6  88.4   Hematocrit 36.0 - 46.0 % 29.1  29.0  32.1   Platelets 150 - 400 K/uL 100  90  84       Latest Ref Rng & Units 07/10/2024    4:12 AM 07/09/2024    4:25 AM 07/08/2024    4:28 AM  CMP  Glucose 70 - 99 mg/dL 893  897  97   BUN 8 - 23 mg/dL 94  74  48   Creatinine 0.44 - 1.00 mg/dL 5.98  6.54  7.48   Sodium 135 - 145 mmol/L 126  129  130   Potassium 3.5 - 5.1 mmol/L 4.1  4.0  3.7   Chloride 98 - 111 mmol/L 86  87  89   CO2 22 - 32 mmol/L 21  24  26    Calcium  8.9 - 10.3 mg/dL 89.2  89.2  9.9      Blood pressure (!) 92/57, pulse (!) 42, temperature 97.8 F (36.6 C), temperature source Axillary, resp. rate 18, height 5' 6 (1.676 m), weight 78.7 kg, SpO2 93%.  Estimated body mass index is 28 kg/m as calculated from the following:   Height as of this encounter: 5' 6 (1.676 m).   Weight as of this encounter: 78.7 kg.   Patient  Active Problem List   Diagnosis Date Noted   Cardiac arrest with ventricular fibrillation (HCC) 07/08/2024   Acute on chronic respiratory failure with hypoxia (HCC) 07/07/2024   Acute on chronic HFrEF (heart failure with reduced ejection fraction) (HCC) 07/07/2024   MRSA bacteremia 07/06/2024   ESRD (end stage renal disease) on dialysis (HCC) 07/06/2024   Fall 07/05/2024   Acute pulmonary edema (HCC) 07/05/2024   Bacteremia due to Streptococcus 07/05/2024   Other secondary kyphosis, cervical region 07/03/2024   Cervical spondylosis 07/03/2024   Syncope and collapse 07/01/2024   Syncope 06/22/2024   Acute idiopathic gout of left foot 04/13/2024   Bunion of great toe 04/13/2024   Other psychoactive substance abuse, uncomplicated (HCC) 03/15/2024   Bipolar disorder in full remission, most recent episode unspecified type 03/15/2024   Chronic pain syndrome 03/15/2024   De Quervain's tenosynovitis, right 07/06/2023   NPDH (new persistent daily headache) 07/06/2023   Chest pain 05/17/2023   Hypertension    Physical deconditioning 03/27/2023   Hallux valgus of right foot 01/07/2023   Pain of left lower extremity 01/07/2023   Peripheral vascular disease of lower extremity 01/02/2023   Other cirrhosis of liver (HCC) 10/08/2022   Ascites 12/19/2021   Hepatitis C antibody positive in blood 12/19/2021   Thrombocytopenia 08/08/2021   Paroxysmal atrial fibrillation (HCC)    Hypothyroidism    ESRD (end stage renal disease) (HCC) 01/30/2019   Swelling of limb 09/22/2016   Generalized weakness 10/15/2014   ESRD on hemodialysis (HCC) 10/15/2014   Chronic hypotension 10/15/2014   Chronic combined systolic and diastolic CHF (congestive heart failure) (HCC) 10/15/2014   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   Pericardial effusion 04/12/2014   Chest wall pain following surgery 04/12/2014  Lactic acidosis 03/26/2014   Mild dementia associated with other underlying disease, without behavioral  disturbance, psychotic disturbance, mood disturbance, or anxiety (HCC) 03/25/2014   Atrial flutter (HCC) 03/22/2014   Superficial incisional surgical site infection 03/14/2014   Exhausted vascular access 02/24/2014   Anemia of chronic disease 02/24/2014   S/P CABG x 3 02/21/2014   Hyperlipidemia 02/13/2014   History of hepatitis C 02/13/2014   HFrEF (heart failure with reduced ejection fraction) (HCC) 02/13/2014   Retrosternal chest pain 05/30/2013   Gallstones 05/29/2013     Progress notes: Reviewed progress notes from [facility], PCCM, vascular surgery, PMT, RT, ID, nephrology and nursing staff.     Imaging: Abdominal x-ray today shows enteric tube within gastric body.  MAR: No changes to [facility]  ACP documents: No documents to review  After reviewing the patient's chart and assessing the patient at bedside, I spoke with patient's brother in regards to symptom management and goals of care.   Ill-appearing female lying in bed.  She is no longer intubated, awake and following commands.  She smiles upon entering room and nods head appropriately to yes or no questions.  Patient's brother Fairy is at the bedside.  Respirations are even and unlabored.  She is in no distress.  Fairy request to step outside room to discuss goals of care.  Discussed patient's current status and poor quality of life.  I inquired about DNR status to which Fairy shares he did not make that decision and is not sure who did.  He continues he was not aware that if his sister were to have another cardiac arrest, CPR would not be performed.  Myles Fairy to contact patient's daughters and other family members to set up family meeting with providers to clarify their goals for her care.  Fairy continues to share he is concerned about his sisters present quality of life and is not sure she would want to be bedbound if she does not get better.  Educated that with his sisters poor health and multiple comorbidities she would most  likely not survive another CPR event or would have worse quality of life than now.  Also discussed if she does not improve and is not able to swallow safely, nutritional status would need to be addressed to include possible PEG placement.  Fairy states he is not sure that would be best for his sister and wants to discuss all of these issues with patient's daughters and sister.  Fairy is appreciative of information and feels that family meeting is urgent regarding decisions in his sister's care.  Therapeutic silence and active listening provided for patient's brother to share his thoughts and emotions regarding current medical situation.  Emotional support provided.  Physical Exam Vitals reviewed.  Constitutional:      General: She is not in acute distress.    Appearance: She is ill-appearing.  HENT:     Head: Normocephalic and atraumatic.     Nose:     Comments: Dobbhoff in place    Mouth/Throat:     Mouth: Mucous membranes are dry.  Pulmonary:     Effort: Pulmonary effort is normal. No respiratory distress.  Musculoskeletal:     Right lower leg: No edema.     Left lower leg: No edema.     Comments: Dressing to chronic left foot wound  Skin:    General: Skin is warm and dry.  Neurological:     Mental Status: She is alert.     Motor: Weakness  present.              Recommendations/Plan: DNR Continue current supportive interventions Patient's brother wants family meeting to have clear goals of care Palliative will follow up for continuing GOC discussions     I personally spent a total of 50 minutes in the care of the patient today including preparing to see the patient, getting/reviewing separately obtained history, performing a medically appropriate exam/evaluation, counseling and educating, referring and communicating with other health care professionals, documenting clinical information in the EHR, independently interpreting results, communicating results, and coordinating  care.    Devere Sacks, ELNITA- [facility] Palliative Medicine Team  07/10/2024 11:15 AM  Office [phone]  Pager [phone]     "

## 2024-07-10 NOTE — Consult Note (Signed)
 PHARMACY CONSULT NOTE - ELECTROLYTES  Pharmacy Consult for Electrolyte Monitoring and Replacement   Recent Labs: Height: 5' 6 (167.6 cm) Weight: 78.7 kg (173 lb 8 oz) IBW/kg (Calculated) : 59.3 Estimated Creatinine Clearance: 15.6 mL/min (A) (by C-G formula based on SCr of 4.01 mg/dL (H)). Potassium (mmol/L)  Date Value  07/10/2024 4.1  07/11/2014 4.7   Magnesium  (mg/dL)  Date Value  98/75/7973 2.1  04/10/2014 1.9   Calcium  (mg/dL)  Date Value  98/73/7973 10.7 (H)   Calcium , Total (mg/dL)  Date Value  98/74/7983 8.3 (L)   Albumin  (g/dL)  Date Value  98/73/7973 2.9 (L)  07/07/2014 3.1 (L)   Phosphorus (mg/dL)  Date Value  98/73/7973 3.6  07/09/2014 3.9   Sodium (mmol/L)  Date Value  07/10/2024 126 (L)  07/09/2014 139   Assessment  [name] is a 64 y.o. female presenting with syncope/in-hospital cardiac arrest. PMH significant for ESRD HD MWF, AF on eliquis , HFrEF, COPD, prior CVA. Pharmacy has been consulted to monitor and replace electrolytes.  Diet: Vital AF at 20 mL/hr + free water  flushes at 30 mL every 4 hours  Goal of Therapy:  Within normal limits  Plan:  Mild hyponatremia, stable Re-check renal function panel tomorrow  Thank you for allowing pharmacy to be a part of this patient's care.  Adriana JONETTA Bolster 07/10/2024 7:19 AM

## 2024-07-10 NOTE — Plan of Care (Signed)
" °  Problem: Clinical Measurements: Goal: Ability to maintain clinical measurements within normal limits will improve Outcome: Progressing Goal: Will remain free from infection Outcome: Progressing Goal: Diagnostic test results will improve Outcome: Progressing   Problem: Coping: Goal: Level of anxiety will decrease Outcome: Progressing   Problem: Elimination: Goal: Will not experience complications related to bowel motility Outcome: Progressing   Problem: Pain Managment: Goal: General experience of comfort will improve and/or be controlled Outcome: Progressing   Problem: Safety: Goal: Ability to remain free from injury will improve Outcome: Progressing   "

## 2024-07-10 NOTE — Progress Notes (Signed)
 "  Date of Admission:  07/01/2024    ID: [name] is a 64 y.o. female  Principal Problem:   Syncope and collapse Active Problems:   ESRD on hemodialysis (HCC)   Chronic hypotension   S/P CABG x 3   Mild dementia associated with other underlying disease, without behavioral disturbance, psychotic disturbance, mood disturbance, or anxiety (HCC)   Anemia of chronic disease   ESRD (end stage renal disease) (HCC)   Paroxysmal atrial fibrillation (HCC)   Hypothyroidism   Chronic combined systolic and diastolic CHF (congestive heart failure) (HCC)   Hepatitis C antibody positive in blood   Other cirrhosis of liver (HCC)   Hypertension   Bipolar disorder in full remission, most recent episode unspecified type   Chronic pain syndrome   Other secondary kyphosis, cervical region   Cervical spondylosis   Fall   Acute pulmonary edema (HCC)   Bacteremia due to Streptococcus   MRSA bacteremia   ESRD (end stage renal disease) on dialysis (HCC)   Acute on chronic respiratory failure with hypoxia (HCC)   Acute on chronic HFrEF (heart failure with reduced ejection fraction) (HCC)   Cardiac arrest with ventricular fibrillation (HCC)    Subjective: Extubated Eyes open  Medications:   amiodarone   200 mg Per Tube TID   Chlorhexidine  Gluconate Cloth  6 each Topical Daily   collagenase   1 Application Topical Daily   free water   30 mL Per Tube Q4H   levothyroxine   75 mcg Per Tube Q0600   metoprolol  tartrate  12.5 mg Per Tube BID   midodrine   10 mg Per Tube TID WC   multivitamin  1 tablet Per Tube QHS   nutrition supplement (JUVEN)  1 packet Per Tube BID BM   sodium chloride  flush  10-40 mL Intracatheter Q12H   vancomycin  variable dose per unstable renal function (pharmacist dosing)   Does not apply See admin instructions    Objective: Vital signs in last 24 hours: Patient Vitals for the past 24 hrs:  BP Temp Temp src Pulse Resp SpO2 Weight  07/10/24 1600 105/69 97.8 F (36.6 C)  Axillary (!) 56 20 96 % --  07/10/24 1545 113/69 -- -- (!) 52 16 95 % --  07/10/24 1530 (!) 83/65 -- -- (!) 51 (!) 25 95 % --  07/10/24 1515 99/67 -- -- (!) 53 16 95 % --  07/10/24 1500 106/62 -- -- (!) 50 19 94 % --  07/10/24 1445 (!) 98/57 -- -- (!) 54 18 95 % --  07/10/24 1430 101/69 -- -- (!) 54 19 96 % --  07/10/24 1415 104/68 -- -- (!) 57 16 95 % --  07/10/24 1400 110/63 -- -- (!) 56 19 96 % --  07/10/24 1345 107/67 -- -- (!) 56 (!) 21 96 % --  07/10/24 1330 115/64 -- -- (!) 54 (!) 22 95 % --  07/10/24 1315 107/63 -- -- (!) 51 (!) 25 96 % --  07/10/24 1300 (!) 97/58 -- -- (!) 50 20 96 % --  07/10/24 1245 103/65 -- -- (!) 46 (!) 22 95 % --  07/10/24 1230 110/63 -- -- (!) 50 19 95 % --  07/10/24 1215 109/66 -- -- (!) 58 (!) 29 96 % --  07/10/24 1200 103/68 98.3 F (36.8 C) Oral (!) 49 (!) 21 96 % --  07/10/24 1145 105/69 -- -- (!) 54 20 95 % --  07/10/24 1130 95/63 -- -- (!) 55 (!) 24 95 % --  07/10/24 1115 112/69 -- -- (!) 58 (!) 25 95 % --  07/10/24 1100 102/67 -- -- (!) 58 (!) 24 94 % --  07/10/24 1045 97/66 -- -- (!) 48 (!) 21 94 % --  07/10/24 1030 101/89 -- -- (!) 48 (!) 27 92 % --  07/10/24 1015 (!) 92/57 -- -- -- 18 -- --  07/10/24 1000 (!) 104/59 -- -- (!) 42 (!) 23 93 % --  07/10/24 0945 118/72 -- -- 64 (!) 26 92 % --  07/10/24 0930 127/73 -- -- 71 (!) 22 93 % --  07/10/24 0915 (!) 155/78 -- -- 66 (!) 27 (!) 86 % --  07/10/24 0900 137/70 -- -- 68 (!) 28 95 % --  07/10/24 0845 132/60 -- -- (!) 58 (!) 26 92 % --  07/10/24 0830 (!) 116/49 -- -- (!) 52 (!) 23 100 % --  07/10/24 0815 110/62 -- -- (!) 53 20 94 % --  07/10/24 0800 118/65 97.8 F (36.6 C) Axillary 67 (!) 23 100 % --  07/10/24 0745 127/72 -- -- 61 20 96 % --  07/10/24 0730 (!) 147/70 -- -- (!) 59 (!) 22 90 % --  07/10/24 0715 119/64 -- -- (!) 52 (!) 21 95 % --  07/10/24 0700 113/67 -- -- (!) 58 16 96 % --  07/10/24 0645 104/71 -- -- (!) 50 16 96 % --  07/10/24 0630 (!) 101/54 -- -- (!) 44 16 95 % --   07/10/24 0615 100/60 -- -- (!) 49 16 95 % --  07/10/24 0600 (!) 104/54 -- -- (!) 36 16 94 % --  07/10/24 0545 98/68 -- -- (!) 47 16 94 % --  07/10/24 0530 (!) 99/51 -- -- (!) 56 16 93 % --  07/10/24 0515 (!) 132/104 -- -- -- 19 -- --  07/10/24 0500 (!) 87/50 -- -- (!) 55 17 93 % --  07/10/24 0445 (!) 94/55 -- -- -- 17 -- --  07/10/24 0409 -- -- -- (!) 57 17 100 % 78.7 kg  07/10/24 0400 109/65 -- -- (!) 56 18 97 % --  07/10/24 0345 102/62 -- -- (!) 56 17 100 % --  07/10/24 0330 105/65 -- -- -- 19 -- --  07/10/24 0315 101/64 -- -- (!) 54 16 100 % --  07/10/24 0300 108/64 -- -- (!) 53 16 100 % --  07/10/24 0248 -- -- -- -- -- 100 % --  07/10/24 0245 97/64 -- -- (!) 57 17 94 % --  07/10/24 0230 102/62 -- -- (!) 51 16 92 % --  07/10/24 0215 104/61 -- -- -- 16 -- --  07/10/24 0200 104/62 -- -- (!) 52 16 100 % --  07/10/24 0145 100/64 -- -- (!) 42 16 100 % --  07/10/24 0130 (!) 99/52 -- -- (!) 48 16 100 % --  07/10/24 0124 103/61 -- -- (!) 49 16 93 % --  07/10/24 0050 (!) 87/57 -- -- -- 20 -- --  07/10/24 0030 103/63 -- -- (!) 49 16 99 % --  07/10/24 0015 106/62 -- -- (!) 55 16 98 % --  07/10/24 0000 104/64 (!) 97.4 F (36.3 C) Axillary (!) 48 16 98 % --  07/09/24 2345 105/65 -- -- (!) 45 17 99 % --  07/09/24 2330 105/74 -- -- (!) 52 16 100 % --  07/09/24 2315 107/71 -- -- (!) 49 16 99 % --  07/09/24 2300 118/67 -- -- --  16 -- --  07/09/24 2245 114/67 -- -- (!) 55 16 100 % --  07/09/24 2230 (!) 109/54 -- -- (!) 59 20 100 % --  07/09/24 2215 (!) 119/59 -- -- (!) 57 20 100 % --  07/09/24 2200 121/75 -- -- 61 15 100 % --  07/09/24 2145 125/70 -- -- (!) 59 17 100 % --  07/09/24 2130 (!) 128/51 -- -- (!) 59 15 100 % --  07/09/24 2115 129/60 (!) 96.8 F (36 C) Axillary (!) 52 16 100 % --  07/09/24 2100 125/69 -- -- (!) 53 16 100 % --  07/09/24 2045 120/66 -- -- (!) 29 16 100 % --  07/09/24 2030 (!) 111/57 -- -- (!) 47 16 100 % --  07/09/24 2015 (!) 108/44 -- -- (!) 57 16 100 % --   07/09/24 2000 (!) 90/32 (!) 96.8 F (36 C) Axillary (!) 50 17 100 % --  07/09/24 1945 (!) 111/35 -- -- (!) 49 16 99 % --  07/09/24 1932 (!) 123/57 -- -- 61 16 100 % --  07/09/24 1930 -- -- -- (!) 49 16 98 % --  07/09/24 1915 (!) 144/46 -- -- (!) 56 16 100 % --  07/09/24 1900 122/80 -- -- 61 16 100 % --  07/09/24 1845 99/65 -- -- (!) 53 17 100 % --  07/09/24 1830 (!) 155/89 -- -- (!) 119 12 99 % --  07/09/24 1815 138/87 -- -- -- (!) 21 98 % --  07/09/24 1800 (!) 129/102 -- -- -- 17 97 % --  07/09/24 1745 117/83 -- -- -- 19 100 % --  07/09/24 1730 102/89 -- -- -- 15 99 % --  07/09/24 1715 138/84 -- -- -- 19 98 % --  07/09/24 1700 137/87 -- -- -- 17 97 % --  07/09/24 1645 (!) 117/90 -- -- 61 (!) 22 100 % --       PHYSICAL EXAM:  General: extubated, eyes open, nods Rt torticollis-  Lungs: b/la ir enry Heart: s1s2 Abdomen: Soft, non-tender,not distended. Bowel sounds normal. No masses Extremities: atraumatic, no cyanosis. No edema. No clubbing Skin: No rashes or lesions. Or bruising Lymph: Cervical, supraclavicular normal. Neurologic: cannot assess  Lab Results    Latest Ref Rng & Units 07/10/2024    4:12 AM 07/09/2024    4:25 AM 07/07/2024    4:12 AM  CBC  WBC 4.0 - 10.5 K/uL 12.4  14.2  17.1   Hemoglobin 12.0 - 15.0 g/dL 89.7  89.6  88.4   Hematocrit 36.0 - 46.0 % 29.1  29.0  32.1   Platelets 150 - 400 K/uL 100  90  84        Latest Ref Rng & Units 07/10/2024    4:12 AM 07/09/2024    4:25 AM 07/08/2024    4:28 AM  CMP  Glucose 70 - 99 mg/dL 893  897  97   BUN 8 - 23 mg/dL 94  74  48   Creatinine 0.44 - 1.00 mg/dL 5.98  6.54  7.48   Sodium 135 - 145 mmol/L 126  129  130   Potassium 3.5 - 5.1 mmol/L 4.1  4.0  3.7   Chloride 98 - 111 mmol/L 86  87  89   CO2 22 - 32 mmol/L 21  24  26    Calcium  8.9 - 10.3 mg/dL 89.2  89.2  9.9       Microbiology:     Assessment/Plan: 63  y.o. female with a history of ESRD, CAD s/p CABG, Afib ICM, h/o pericardial effusion h/o PEA  arrest , steal syndrome rt arm with ligation of rt brachial artery to axillary vein graft on 04/20/24 recently in the hospital between 06/22/24 until 06/28/24 for syncope, fluid over load, chronic hypotension, cardiogenic shock EF 25-30%,  presenting with fall/ weakness and found on the floor   Syncope Related to her cardiac state    Severe ischemic cardiomyopathy with biventricular failure    ESRD on dialysis thru a catheter   Ventricular fibrillation and cardiac arrest on 07/02/24- resucitated - now intubated  on pressor and amiodarone    Strep gordonii bacteremia- MRSA bacteremia   blood culture done after resuscitation though she had fever on the day of admission  but no blood  culture sent on admission Likely related to dialysis cath HD cath removed over the weekend Vancomycin  started Continue ceftriaxone  until peritoneal fluid culture is back Repeat blood cultrue has been sent 2 d echo      C5-C6 vertebral collaspe of endplates- degeneration VS burnt out infection     Ascites s/p paracentesis- possible cirrhosis could be cardiac cirrhosis Cell coulnt 605 ( 44% N) Protein not sent Culture neg so far Can DC cefriaxone   Chronic thrombocytopenia  likely due to cirrhosis Anemia      HEPC antibody positive but RNA negative -= so no active infection   Stasis dermatitis with superficial ulcers- question of 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 Discussed the management with the son and intensivist     "

## 2024-07-10 NOTE — Progress Notes (Signed)
 "  NAME:  [name], MRN:  [id number], DOB:  [birth year], LOS: 8 ADMISSION DATE:  07/01/2024, CHIEF COMPLAINT:  Syncope, V-Fib Arrest  History of Present Illness:  [name] is a 64 y.o. female with a pmh of of ESRD on HD (MWF), COPD, CHF, bipolar disorder, chronic hypotension on midodrine , HLD and prior CVA who presented to [facility] ED via EMS following syncopal episode.    History was gathered per chart review Per patient, her normal dialysis schedule is MWF.  Patient was recently admitted to the intensive care unit earlier this month July 03, 2024.  At that time she was treated for acute on chronic hypoxemia with circulatory shock since then she has been discharged home and while at home was found down by her family and was brought into the hospital.  After receiving medical management on the mat on the floor and dialysis patient had episode of pulselessness with wide complex tachyarrhythmia.  Rapid response was initiated and ACLS was started on patient.  She received 2 rounds of epinephrine  as well as shock with return of spontaneous circulation.  1 dose of IV amiodarone  was delivered and cardiology was contacted with initiation of amiodarone  infusion.  Patient was brought into the ICU on ventilator and intubated by EDP during CODE BLUE.  Pertinent  Medical History  07/02/24-transferred to ICU post CODE BLUE on medical floor status post ACLS with ROSC  07/03/24- overnight, unresposive with AF. MWF(HD). Will meet with family. Will continue to monitor and re-evaluate post dialysis.  07/04/24- patient had few episodes of VT. She is DNR code status.  She was seen by cardiology, she has poor prognosis. No procedures are planned.  07/05/24- patient had hypotensive episode during HD so it was interuppted.  The lidocaine  infusion will be dcd today and amiodarone  will continue.  Plan for paracentesis today. 07/06/24- patient with no events overnight, did have a non sustained run of VT similar to  previous.  Performing awakening trial today.  Due to staph/strep bacteremia the tunnelled vascCath has to come out.  07/07/24 - started PO amiodarone , MRI brain unremarkable 07/08/24 - more awake and interactive 07/09/24 - intubated and off sedation, failed WUA 07/10/24 - more awake, following commands, extubated  Significant Hospital Events: Including procedures, antibiotic start and stop dates in addition to other pertinent events   Hypotension & Hypertension HLD HFrEF ESRD on HD Stroke CAD s/p CABG x 3 RBBB Pulmonary hypertension PVD Ischemic cardiomyopathy Hyperkalemia Hypothyroidism Atrial fibrillation on eliquis  Anemia of chronic kidney disease  Interim History / Subjective:  Awake and alert, following commands  Objective    Blood pressure 103/68, pulse (!) 49, temperature 98.3 F (36.8 C), temperature source Oral, resp. rate (!) 21, height 5' 6 (1.676 m), weight 78.7 kg, SpO2 96%.    Vent Mode: PSV FiO2 (%):  [28 %] 28 % Set Rate:  [16 bmp] 16 bmp Vt Set:  [450 mL] 450 mL PEEP:  [5 cmH20] 5 cmH20 Pressure Support:  [5 cmH20] 5 cmH20   Intake/Output Summary (Last 24 hours) at 07/10/2024 1317 Last data filed at 07/10/2024 1025 Gross per 24 hour  Intake 1117.73 ml  Output --  Net 1117.73 ml   Filed Weights   07/08/24 0435 07/09/24 0500 07/10/24 0409  Weight: 75.3 kg 75.4 kg 78.7 kg    Examination: Physical Exam Constitutional:      General: She is not in acute distress.    Appearance: She is ill-appearing.  HENT:  Mouth/Throat:     Comments: ETT in place Cardiovascular:     Rate and Rhythm: Normal rate and regular rhythm.     Pulses: Normal pulses.     Heart sounds: Normal heart sounds.  Pulmonary:     Comments: Ventilated breath sounds bilaterally Neurological:     Mental Status: She is disoriented.     Comments: Awake today, following commands      Assessment and Plan   #Cardiac Arrest  #Ventricular Fibrillation #CAD  #Ischemic  Cardiomyopathy #Septic Shock  #Gram Positive Bacteremia #COPD #Afib on Eliquis  #ESRD on HD #HyperKalemia  Neurology - remains encephalopathic off sedation. Mental status further improved today. Remains off sedation, plan to extubate today. MRI was without acute pathology.   Cardiovascular - V. Fib arrest for which she was intubated and started on amiodarone  (and previously received lidocaine ). She has atrial fibrillation as well as HFrEF (LVEF 25% with global hypokinesis) and a dysfunctional and enlarged right ventricle. On PO amiodarone  now, and metoprolol  started. Coox with O2 sat of 81%. Will need GDMT after extubation, cardiology and EP following, seen by cards today.   -Amiodarone  200 mg tid  -Metoprolol  12.5 mg bid  -goal MAP > 65 mmHg, on vasopressin   -continue IV heparin   Pulmonary - respiratory failure in the setting of cardiac arrest, now intubated and ventilated with minimal ventilator settings. Mental status remains barrier to extubation. Continue to work towards liberation off the vent with daily SBT.  -SBT today  Gastrointestinal - s/p paracentesis (3.5 liters). Holding tube feeds with extubation. PPI for SUP, discontinued.   Renal - ESRD on HD through a tunneled dialysis catheter. With concern for bacteremia this was explanted yesterday, and she will likley need an HD catheter tomorrow after a line holiday. Will plan ahead with ID, nephrology and vascular surgery for the best approach to obtaining access (temp cath vs tunneled)  Endocrine - ICU glycemic protocol.  Hem/Onc - heparin  gtt given Afib.  ID - gram positive bacteremia with staph gordonii as well as MRSA. Tunneled catheter explanted on 1/24. Repeat blood cultures remain negative. Continued on IV antibiotics. Appreciate input from ID, renal and vascular regarding dialysis catheter. Will place a temporary HD catheter tomorrow with plans for another temp catheter later in the week.  -continue CTX and  Vancomycin    Labs   CBC: Recent Labs  Lab 07/05/24 0439 07/06/24 0407 07/07/24 0412 07/09/24 0425 07/10/24 0412  WBC 13.7* 15.5* 17.1* 14.2* 12.4*  HGB 11.2* 11.7* 11.5* 10.3* 10.2*  HCT 32.6* 33.7* 32.1* 29.0* 29.1*  MCV 89.8 89.4 86.5 85.3 86.6  PLT 68* 78* 84* 90* 100*    Basic Metabolic Panel: Recent Labs  Lab 07/04/24 0727 07/05/24 0439 07/06/24 0407 07/07/24 0412 07/08/24 0428 07/09/24 0425 07/10/24 0412  NA  --  131* 130* 129* 130* 129* 126*  K 3.9 3.7 4.0 4.1 3.7 4.0 4.1  CL  --  91* 90* 89* 89* 87* 86*  CO2  --  25 26 26 26 24  21*  GLUCOSE  --  79 96 108* 97 102* 106*  BUN  --  36* 29* 58* 48* 74* 94*  CREATININE  --  3.97* 2.83* 3.63* 2.51* 3.45* 4.01*  CALCIUM   --  10.2 10.0 10.9* 9.9 10.7* 10.7*  MG 1.9 2.4 2.1 2.1 2.1  --   --   PHOS  --  2.8 2.8 3.0 2.2* 3.1 3.6   GFR: Estimated Creatinine Clearance: 15.6 mL/min (A) (by C-G formula based on SCr  of 4.01 mg/dL (H)). Recent Labs  Lab 07/06/24 0407 07/07/24 0412 07/09/24 0425 07/10/24 0412  WBC 15.5* 17.1* 14.2* 12.4*    Liver Function Tests: Recent Labs  Lab 07/05/24 0439 07/06/24 0407 07/07/24 0412 07/09/24 0425 07/10/24 0412  ALBUMIN  2.9* 3.0* 2.8* 2.8* 2.9*   No results for input(s): LIPASE, AMYLASE in the last 168 hours. No results for input(s): AMMONIA in the last 168 hours.   ABG    Component Value Date/Time   PHART 7.5 (H) 07/02/2024 2058   PCO2ART 33 07/02/2024 2058   PO2ART 202 (H) 07/02/2024 2058   HCO3 25.7 07/02/2024 2058   TCO2 26 04/20/2024 0708   ACIDBASEDEF 0.2 07/01/2024 2155   O2SAT 81.8 07/07/2024 2029     Coagulation Profile: Recent Labs  Lab 07/05/24 0820  INR 1.2    Cardiac Enzymes: No results for input(s): CKTOTAL, CKMB, CKMBINDEX, TROPONINI in the last 168 hours.  HbA1C: No results found for: HGBA1C  CBG: Recent Labs  Lab 07/09/24 2004 07/10/24 0001 07/10/24 0330 07/10/24 0759 07/10/24 1221  GLUCAP 112* 96 112* 113*  100*    Review of Systems:   N/A  Past Medical History:  She,  has a past medical history of Acute lacunar infarction (HCC) (03/03/2014), Adult behavior problems, Anemia of chronic renal failure, Aortic atherosclerosis, Arthritis, Atrial fibrillation and flutter (HCC), Bipolar disorder (HCC), Cardiomegaly, Cerebral microvascular disease, CHF (congestive heart failure) (HCC), Chicken pox, Cholelithiasis, Chronic respiratory failure with hypoxia (HCC) (12/19/2021), COPD (chronic obstructive pulmonary disease) (HCC), Coronary artery disease, DDD (degenerative disc disease), cervical, DDD (degenerative disc disease), thoracolumbar, Diverticulosis, Dyspnea, ESRD (end stage renal disease) on dialysis (M,W,F), GERD (gastroesophageal reflux disease), Gout, HCV (hepatitis C virus), Headache, HFrEF (heart failure with reduced ejection fraction) (HCC), History of delirium (04/12/2014), History of substance abuse (HCC), Hyperkalemia (11/13/2014), Hyperlipidemia, Hypertension, Hypotension (02/24/2014), Hypothyroidism, Ischemic cardiomyopathy, Nose colonized with MRSA (02/01/2024), NSTEMI (non-ST elevated myocardial infarction) (HCC) (10/08/2013), NSTEMI (non-ST elevated myocardial infarction) (HCC) (01/23/2002), Obesity, 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, On apixaban  therapy, Peripheral vascular disease, Pneumonia, Postoperative anemia due to acute blood loss (02/27/2014), Postoperative cerebrovascular infarction following cardiac surgery (02/21/2014), Pulmonary hypertension (HCC), RBBB (right bundle branch block), Renal osteodystrophy, S/P CABG x 3 (02/20/2014), Stroke (HCC) (05/23/2010), and Syncope and collapse (07/25/2020).   Surgical History:   Past Surgical History:  Procedure Laterality Date   A/V FISTULAGRAM Left 03/30/2019   Procedure: A/V FISTULAGRAM;  Surgeon: Marea Selinda RAMAN, MD;  Location: [facility] IN[facility]IVE CV [facility];  Service: Cardiovascular;  Laterality: Left;   A/V FISTULAGRAM Left 10/06/2019    Procedure: A/V FISTULAGRAM;  Surgeon: Marea Selinda RAMAN, MD;  Location: [facility];  Service: Cardiovascular;  Laterality: Left;   A/V FISTULAGRAM Left 05/19/2021   Procedure: A/V FISTULAGRAM;  Surgeon: Marea Selinda RAMAN, MD;  Location: [facility];  Service: Cardiovascular;  Laterality: Left;   A/V FISTULAGRAM Left 01/29/2022   Procedure: A/V Fistulagram;  Surgeon: Marea Selinda RAMAN, MD;  Location: [facility];  Service: Cardiovascular;  Laterality: Left;   A/V FISTULAGRAM Left 04/30/2022   Procedure: A/V Fistulagram;  Surgeon: Marea Selinda RAMAN, MD;  Location: [facility];  Service: Cardiovascular;  Laterality: Left;   A/V FISTULAGRAM Left 08/20/2022   Procedure: A/V Fistulagram;  Surgeon: Marea Selinda RAMAN, MD;  Location: [facility];  Service: Cardiovascular;  Laterality: Left;   A/V FISTULAGRAM N/A 12/03/2022   Procedure: A/V Fistulagram;  Surgeon: Marea Selinda RAMAN, MD;  Location: [facility] INVASIVE  CV LAB;  Service: Cardiovascular;  Laterality: N/A;   A/V FISTULAGRAM Left 02/11/2023   Procedure: A/V Fistulagram;  Surgeon: Marea Selinda RAMAN, MD;  Location: [facility];  Service: Cardiovascular;  Laterality: Left;   A/V FISTULAGRAM Left 02/07/2024   Procedure: A/V Fistulagram;  Surgeon: Marea Selinda RAMAN, MD;  Location: [facility];  Service: Cardiovascular;  Laterality: Left;   COLONOSCOPY     COLONOSCOPY WITH PROPOFOL  N/A 04/22/2021   Procedure: COLONOSCOPY WITH PROPOFOL ;  Surgeon: Maryruth Ole DASEN, MD;  Location: [facility] ENDOSCOPY;  Service: Endoscopy;  Laterality: N/A;   CORONARY ANGIOPLASTY WITH STENT PLACEMENT Left 01/25/2002   Procedure: CORONARY ANGIOPLASTY WITH STENT PLACEMENT; Location: [facility]; Surgeon: Margie Lovelace, MD   CORONARY ARTERY BYPASS GRAFT N/A 02/20/2014   Procedure: CORONARY ARTERY BYPASS GRAFT; Location: [facility]; Surgeon: Reyes Fruits, MD   DIALYSIS FISTULA CREATION     DIALYSIS/PERMA CATHETER INSERTION N/A 04/08/2023   Procedure: DIALYSIS/PERMA  CATHETER INSERTION;  Surgeon: Marea Selinda RAMAN, MD;  Location: [facility];  Service: Cardiovascular;  Laterality: N/A;   DIALYSIS/PERMA CATHETER REMOVAL N/A 07/06/2023   Procedure: DIALYSIS/PERMA CATHETER REMOVAL;  Surgeon: [name] Cordella MATSU, MD;  Location: [facility];  Service: Cardiovascular;  Laterality: N/A;   ESOPHAGOGASTRODUODENOSCOPY     ESOPHAGOGASTRODUODENOSCOPY N/A 04/22/2021   Procedure: ESOPHAGOGASTRODUODENOSCOPY (EGD);  Surgeon: Maryruth Ole DASEN, MD;  Location: [facility] ENDOSCOPY;  Service: Endoscopy;  Laterality: N/A;   FLEXIBLE SIGMOIDOSCOPY N/A 02/23/2019   Procedure: FLEXIBLE SIGMOIDOSCOPY;  Surgeon: Gaylyn Gladis PENNER, MD;  Location: [facility] ENDOSCOPY;  Service: Endoscopy;  Laterality: N/A;   IABP INSERTION  02/20/2014   Procedure: INTRA-AORTIC BALLOON PUMP INSERTION; Location: [facility]; Surgeon: Reyes Fruits, MD   INSERTION OF ARTERIOVENOUS (AV) ARTEGRAFT ARM Right 03/09/2024   Procedure: INSERTION, GRAFT, ARTERIOVENOUS, UPPER EXTREMITY;  Surgeon: Marea Selinda RAMAN, MD;  Location: [facility] OR[facility];  Service: Vascular;  Laterality: Right;  BRACHIAL AXILLARY   INSERTION OF DIALYSIS CATHETER N/A 04/20/2024   Procedure: INSERTION OF DIALYSIS CATHETER;  Surgeon: Marea Selinda RAMAN, MD;  Location: [facility] OR[facility];  Service: Vascular;  Laterality: N/A;   LEFT HEART CATH AND CORONARY ANGIOGRAPHY Left 01/24/2002   Procedure: LEFT HEART CATH AND CORONARY ANGIOGRAPHY; Location: [facility]; Surgeon: Margie Lovelace, MD   LEFT HEART CATH AND CORONARY ANGIOGRAPHY Left 05/25/2012   Procedure: LEFT HEART CATH AND CORONARY ANGIOGRAPHY; Location: [facility]; Surgeon: Margie Lovelace, MD   LEFT HEART CATH AND CORONARY ANGIOGRAPHY Left 10/09/2013   Procedure: LEFT HEART CATH AND CORONARY ANGIOGRAPHY; Location: [facility]; Surgeon: Vinie Jude, MD   LEFT HEART CATH AND CORS/GRAFTS ANGIOGRAPHY N/A 08/08/2018   Procedure: LEFT HEART CATH AND CORS/GRAFTS ANGIOGRAPHY;  Surgeon: Fernand Denyse LABOR, MD;  Location: [facility];   Service: Cardiovascular;  Laterality: N/A;   LEFT HEART CATH AND CORS/GRAFTS ANGIOGRAPHY N/A 01/30/2019   Procedure: LEFT HEART CATH AND CORS/GRAFTS ANGIOGRAPHY;  Surgeon: Fernand Denyse LABOR, MD;  Location: [facility];  Service: Cardiovascular;  Laterality: N/A;   LIGATION ARTERIOVENOUS GORTEX GRAFT Right 04/20/2024   Procedure: LIGATION ARTERIOVENOUS GORTEX GRAFT;  Surgeon: Marea Selinda RAMAN, MD;  Location: [facility];  Service: Vascular;  Laterality: Right;   LOWER EXTREMITY ANGIOGRAPHY Left 04/24/2024   Procedure: Lower Extremity Angiography;  Surgeon: Marea Selinda RAMAN, MD;  Location: [facility];  Service: Cardiovascular;  Laterality: Left;   ultrasound guided pericardiocentesis     UPPER EXTREMITY ANGIOGRAPHY Right 03/30/2024   Procedure: Upper Extremity Angiography;  Surgeon: Marea Selinda RAMAN, MD;  Location: [facility]  INVASIVE CV LAB;  Service: Cardiovascular;  Laterality: Right;     Social History:   reports that she has been smoking cigarettes. She has a 5 pack-year smoking history. She has never used smokeless tobacco. She reports that she does not drink alcohol and does not use drugs.   Family History:  Her family history includes Breast cancer in her maternal aunt and maternal grandmother; Cancer in an other family member; Diabetes type II in her maternal grandmother and mother; Hypertension in her father, mother, sister, and another family member; Kidney disease in her father; Ovarian cancer in her mother; Renal Disease in an other family member.   Allergies Allergies[1]   Home Medications  Prior to Admission medications  Medication Sig Start Date End Date Taking? Authorizing Provider  albuterol  (VENTOLIN  HFA) 108 (90 Base) MCG/ACT inhaler Inhale 1-2 puffs into the lungs every 6 (six) hours as needed for wheezing or shortness of breath. 01/04/24  Yes Orlean Alan HERO, FNP  apixaban  (ELIQUIS ) 5 MG TABS tablet Take 5 mg by mouth 2 (two) times daily. 09/11/19  Yes [provider]   clobetasol cream (TEMOVATE) 0.05 % Apply 1 Application topically daily. 06/16/24  Yes [provider]  hydrOXYzine  (ATARAX /VISTARIL ) 50 MG tablet Take 50 mg by mouth 2 (two) times daily.   Yes [provider]  levothyroxine  (SYNTHROID ) 75 MCG tablet Take 75 mcg by mouth daily before breakfast.  11/02/18  Yes [provider]  lidocaine -prilocaine  (EMLA ) cream Apply 1 Application topically once. 01/22/21  Yes [provider]  [Paused] metoprolol  succinate (TOPROL -XL) 25 MG 24 hr tablet Take 1 tablet by mouth every morning. Wait to take this until your doctor or other care provider tells you to start again. 08/27/23 08/26/24 Yes [provider]  midodrine  (PROAMATINE ) 10 MG tablet Take 1 tablet (10 mg total) by mouth 3 (three) times daily. 06/27/24  Yes Ponnala, Shruthi, MD  oxyCODONE -acetaminophen  (PERCOCET) 5-325 MG tablet Take 1 tablet by mouth every 6 (six) hours as needed for severe pain (pain score 7-10). 06/27/24  Yes Ponnala, Shruthi, MD  pantoprazole  (PROTONIX ) 40 MG tablet Take 1 tablet (40 mg total) by mouth every morning. 06/27/24  Yes Ponnala, Shruthi, MD  pregabalin (LYRICA) 50 MG capsule Take 50 mg by mouth daily. Patient not taking: Reported on 06/23/2024    [provider]  ranolazine  (RANEXA ) 1000 MG SR tablet Take 1,000 mg by mouth 2 (two) times daily. Patient not taking: Reported on 06/23/2024 03/18/23   [provider]  rosuvastatin  (CRESTOR ) 20 MG tablet Take 20 mg by mouth at bedtime. Patient not taking: Reported on 06/23/2024 11/07/21   [provider]  sevelamer  carbonate (RENVELA ) 800 MG tablet Take 3,200 mg by mouth 3 (three) times daily with meals. Patient not taking: Reported on 06/23/2024    [provider]  venlafaxine  (EFFEXOR ) 37.5 MG tablet Take 37.5 mg by mouth 2 (two) times daily with a meal. Patient not taking: Reported on 06/23/2024    [provider]     The patient is critically ill due to  circulatory shock, acute hypoxic respiratory failure, ESRD, gram positive bacteremia.  Critical care was necessary to treat or prevent imminent or life-threatening deterioration. I personally performed titration, monitoring, and management of vasopressor/ionotrope infusion, mechanical ventilation management and titration, and weaning of mechanical ventilation. Critical care time was spent by me on the following activities: development of a treatment plan with the patient and/or surrogate as well as nursing, discussions with consultants, evaluation of  the patient's response to treatment, examination of the patient, obtaining a history from the patient or surrogate, ordering and performing treatments and interventions, ordering and review of laboratory studies, ordering and review of radiographic studies, review of telemetry data including pulse oximetry, re-evaluation of patient's condition and participation in multidisciplinary rounds.   I personally spent 40 minutes providing critical care not including any separately billable procedures.   Belva November, MD [facility] 07/10/2024 1:25 PM             [1]  Allergies Allergen Reactions   Morphine  And Codeine Hives   Levaquin [Levofloxacin] Itching and Other (See Comments)    Severe itching; prickly sensation; severe left leg pain; immobility.   Enalapril  Other (See Comments)    hallucinations   Latex Rash   Tape Rash and Other (See Comments)   "

## 2024-07-10 NOTE — Progress Notes (Signed)
 [facility] Cardiology  CARDIOLOGY PROGRESS NOTE  Patient ID: [name] MRN: [id number] DOB/AGE: 1961-02-25 64 y.o.  Admit date: 07/01/2024 Referring Physician Dr. Belva Reason for Consultation ventricular tachycardia  HPI: Confused, cannot engage in conversation.  Extubated now.  Review of systems complete and found to be negative unless listed above     Past Medical History:  Diagnosis Date   Acute lacunar infarction (HCC) 03/03/2014   a.) MRI brain 03/03/2014: two acute punctate lacunar infarcts anteriorly in the BILATERAL centrum semiovale   Adult behavior problems    Anemia of chronic renal failure    Aortic atherosclerosis    Arthritis    Atrial fibrillation and flutter (HCC)    a.) CHA2DS2VASc = 6 (sex, HFrEF, HTN, CVA x 2, prior MI/vascular disease) as of 02/04/2024; b.) rate/rhythm maintained on oral metoprolol  succinate; chronically anticoagulated with apixaban    Bipolar disorder (HCC)    Cardiomegaly    Cerebral microvascular disease    CHF (congestive heart failure) (HCC)    Chicken pox    Cholelithiasis    Chronic respiratory failure with hypoxia (HCC) 12/19/2021   COPD (chronic obstructive pulmonary disease) (HCC)    Coronary artery disease    DDD (degenerative disc disease), cervical    DDD (degenerative disc disease), thoracolumbar    Diverticulosis    Dyspnea    ESRD (end stage renal disease) on dialysis (M,W,F)    GERD (gastroesophageal reflux disease)    Gout    HCV (hepatitis C virus)    Headache    HFrEF (heart failure with reduced ejection fraction) (HCC)    History of delirium 04/12/2014   History of substance abuse (HCC)    a.) tobacco + cocaine   Hyperkalemia 11/13/2014   Hyperlipidemia    Hypertension    Hypotension 02/24/2014   Hypothyroidism    Ischemic cardiomyopathy    Nose colonized with MRSA 02/01/2024   a.) presurgical PCR (+) on 02/01/2024 prior to A/V FISTULAGRAM; b.) presurgical PCR (+) on 03/07/2024 prior to AV FISTULA CREATION    NSTEMI (non-ST elevated myocardial infarction) (HCC) 10/08/2013   NSTEMI (non-ST elevated myocardial infarction) (HCC) 01/23/2002   a.) LHC 01/24/2002 --> multi-vessel CAD with IRA being 90% OM2 --> delayed PCI until 01/25/2002 --> 3.0 x 23 mm BX Velocity stent   Obesity    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    On apixaban  therapy    Peripheral vascular disease    Pneumonia    Postoperative anemia due to acute blood loss 02/27/2014   Postoperative cerebrovascular infarction following cardiac surgery 02/21/2014   a.) developed RIGHT upper/lower extremity weakness following cardiac revascularization --> neuro consult and head CT --> Hypodensity noted in the area of the LEFT motor cortex consistent with a component of acute ischemia   Pulmonary hypertension (HCC)    RBBB (right bundle branch block)    Renal osteodystrophy    S/P CABG x 3 02/20/2014   a.) LIMA-LAD, SVG-OM1, SVG-PDA   Stroke (HCC) 05/23/2010   a.) brain MRI 05/23/2010: BILATERAL acute infarcts and old prior infarcts; RIGHT frontal lobe extending into the white matter. Small focus of restricted diffusion in the cortex of the medial LEFT frontoparietal lobe. Periventricular T2 and multiple foci of T2 hyperintensity in the subcortical white matter and increased FLAIR signal in the cortex of the LEFT frontal lobe.   Syncope and collapse 07/25/2020    Past Surgical History:  Procedure Laterality Date   A/V FISTULAGRAM Left 03/30/2019   Procedure: A/V  FISTULAGRAM;  Surgeon: Marea Selinda RAMAN, MD;  Location: [facility];  Service: Cardiovascular;  Laterality: Left;   A/V FISTULAGRAM Left 10/06/2019   Procedure: A/V FISTULAGRAM;  Surgeon: Marea Selinda RAMAN, MD;  Location: [facility];  Service: Cardiovascular;  Laterality: Left;   A/V FISTULAGRAM Left 05/19/2021   Procedure: A/V FISTULAGRAM;  Surgeon: Marea Selinda RAMAN, MD;  Location: [facility];  Service: Cardiovascular;  Laterality: Left;   A/V  FISTULAGRAM Left 01/29/2022   Procedure: A/V Fistulagram;  Surgeon: Marea Selinda RAMAN, MD;  Location: [facility];  Service: Cardiovascular;  Laterality: Left;   A/V FISTULAGRAM Left 04/30/2022   Procedure: A/V Fistulagram;  Surgeon: Marea Selinda RAMAN, MD;  Location: [facility];  Service: Cardiovascular;  Laterality: Left;   A/V FISTULAGRAM Left 08/20/2022   Procedure: A/V Fistulagram;  Surgeon: Marea Selinda RAMAN, MD;  Location: [facility];  Service: Cardiovascular;  Laterality: Left;   A/V FISTULAGRAM N/A 12/03/2022   Procedure: A/V Fistulagram;  Surgeon: Marea Selinda RAMAN, MD;  Location: [facility];  Service: Cardiovascular;  Laterality: N/A;   A/V FISTULAGRAM Left 02/11/2023   Procedure: A/V Fistulagram;  Surgeon: Marea Selinda RAMAN, MD;  Location: [facility];  Service: Cardiovascular;  Laterality: Left;   A/V FISTULAGRAM Left 02/07/2024   Procedure: A/V Fistulagram;  Surgeon: Marea Selinda RAMAN, MD;  Location: [facility];  Service: Cardiovascular;  Laterality: Left;   COLONOSCOPY     COLONOSCOPY WITH PROPOFOL  N/A 04/22/2021   Procedure: COLONOSCOPY WITH PROPOFOL ;  Surgeon: Maryruth Ole DASEN, MD;  Location: [facility];  Service: Endoscopy;  Laterality: N/A;   CORONARY ANGIOPLASTY WITH STENT PLACEMENT Left 01/25/2002   Procedure: CORONARY ANGIOPLASTY WITH STENT PLACEMENT; Location: [facility]; Surgeon: Margie Lovelace, MD   CORONARY ARTERY BYPASS GRAFT N/A 02/20/2014   Procedure: CORONARY ARTERY BYPASS GRAFT; Location: [facility]; Surgeon: Reyes Fruits, MD   DIALYSIS FISTULA CREATION     DIALYSIS/PERMA CATHETER INSERTION N/A 04/08/2023   Procedure: DIALYSIS/PERMA CATHETER INSERTION;  Surgeon: Marea Selinda RAMAN, MD;  Location: [facility];  Service: Cardiovascular;  Laterality: N/A;   DIALYSIS/PERMA CATHETER REMOVAL N/A 07/06/2023   Procedure: DIALYSIS/PERMA CATHETER REMOVAL;  Surgeon: [name] Cordella MATSU, MD;  Location: [facility];  Service: Cardiovascular;   Laterality: N/A;   ESOPHAGOGASTRODUODENOSCOPY     ESOPHAGOGASTRODUODENOSCOPY N/A 04/22/2021   Procedure: ESOPHAGOGASTRODUODENOSCOPY (EGD);  Surgeon: Maryruth Ole DASEN, MD;  Location: [facility] ENDOSCOPY;  Service: Endoscopy;  Laterality: N/A;   FLEXIBLE SIGMOIDOSCOPY N/A 02/23/2019   Procedure: FLEXIBLE SIGMOIDOSCOPY;  Surgeon: Gaylyn Gladis PENNER, MD;  Location: [facility] ENDOSCOPY;  Service: Endoscopy;  Laterality: N/A;   IABP INSERTION  02/20/2014   Procedure: INTRA-AORTIC BALLOON PUMP INSERTION; Location: [facility]; Surgeon: Reyes Fruits, MD   INSERTION OF ARTERIOVENOUS (AV) ARTEGRAFT ARM Right 03/09/2024   Procedure: INSERTION, GRAFT, ARTERIOVENOUS, UPPER EXTREMITY;  Surgeon: Marea Selinda RAMAN, MD;  Location: [facility];  Service: Vascular;  Laterality: Right;  BRACHIAL AXILLARY   INSERTION OF DIALYSIS CATHETER N/A 04/20/2024   Procedure: INSERTION OF DIALYSIS CATHETER;  Surgeon: Marea Selinda RAMAN, MD;  Location: [facility];  Service: Vascular;  Laterality: N/A;   LEFT HEART CATH AND CORONARY ANGIOGRAPHY Left 01/24/2002   Procedure: LEFT HEART CATH AND CORONARY ANGIOGRAPHY; Location: [facility]; Surgeon: Margie Lovelace, MD   LEFT HEART CATH AND CORONARY ANGIOGRAPHY Left 05/25/2012   Procedure: LEFT HEART CATH AND CORONARY ANGIOGRAPHY; Location: [facility]; Surgeon: Margie Lovelace, MD   LEFT HEART CATH  AND CORONARY ANGIOGRAPHY Left 10/09/2013   Procedure: LEFT HEART CATH AND CORONARY ANGIOGRAPHY; Location: [facility]; Surgeon: Vinie Jude, MD   LEFT HEART CATH AND CORS/GRAFTS ANGIOGRAPHY N/A 08/08/2018   Procedure: LEFT HEART CATH AND CORS/GRAFTS ANGIOGRAPHY;  Surgeon: Fernand Denyse LABOR, MD;  Location: [facility];  Service: Cardiovascular;  Laterality: N/A;   LEFT HEART CATH AND CORS/GRAFTS ANGIOGRAPHY N/A 01/30/2019   Procedure: LEFT HEART CATH AND CORS/GRAFTS ANGIOGRAPHY;  Surgeon: Fernand Denyse LABOR, MD;  Location: [facility];  Service: Cardiovascular;  Laterality: N/A;   LIGATION ARTERIOVENOUS GORTEX GRAFT Right  04/20/2024   Procedure: LIGATION ARTERIOVENOUS GORTEX GRAFT;  Surgeon: Marea Selinda RAMAN, MD;  Location: [facility];  Service: Vascular;  Laterality: Right;   LOWER EXTREMITY ANGIOGRAPHY Left 04/24/2024   Procedure: Lower Extremity Angiography;  Surgeon: Marea Selinda RAMAN, MD;  Location: [facility];  Service: Cardiovascular;  Laterality: Left;   ultrasound guided pericardiocentesis     UPPER EXTREMITY ANGIOGRAPHY Right 03/30/2024   Procedure: Upper Extremity Angiography;  Surgeon: Marea Selinda RAMAN, MD;  Location: [facility];  Service: Cardiovascular;  Laterality: Right;    Medications Prior to Admission  Medication Sig Dispense Refill Last Dose/Taking   albuterol  (VENTOLIN  HFA) 108 (90 Base) MCG/ACT inhaler Inhale 1-2 puffs into the lungs every 6 (six) hours as needed for wheezing or shortness of breath. 20.1 g 3 Unknown   apixaban  (ELIQUIS ) 5 MG TABS tablet Take 5 mg by mouth 2 (two) times daily.   06/30/2024   clobetasol cream (TEMOVATE) 0.05 % Apply 1 Application topically daily.   Unknown   hydrOXYzine  (ATARAX /VISTARIL ) 50 MG tablet Take 50 mg by mouth 2 (two) times daily.   06/30/2024   levothyroxine  (SYNTHROID ) 75 MCG tablet Take 75 mcg by mouth daily before breakfast.    06/30/2024   lidocaine -prilocaine  (EMLA ) cream Apply 1 Application topically once.   Unknown   [Paused] metoprolol  succinate (TOPROL -XL) 25 MG 24 hr tablet Take 1 tablet by mouth every morning.   Taking   midodrine  (PROAMATINE ) 10 MG tablet Take 1 tablet (10 mg total) by mouth 3 (three) times daily. 90 tablet 0 06/30/2024   oxyCODONE -acetaminophen  (PERCOCET) 5-325 MG tablet Take 1 tablet by mouth every 6 (six) hours as needed for severe pain (pain score 7-10). 20 tablet 0 07/01/2024 at  4:00 AM   pantoprazole  (PROTONIX ) 40 MG tablet Take 1 tablet (40 mg total) by mouth every morning. 30 tablet 1 06/30/2024   pregabalin (LYRICA) 50 MG capsule Take 50 mg by mouth daily. (Patient not taking: Reported on 06/23/2024)   Not Taking    ranolazine  (RANEXA ) 1000 MG SR tablet Take 1,000 mg by mouth 2 (two) times daily. (Patient not taking: Reported on 06/23/2024)   Not Taking   rosuvastatin  (CRESTOR ) 20 MG tablet Take 20 mg by mouth at bedtime. (Patient not taking: Reported on 06/23/2024)   Not Taking   sevelamer  carbonate (RENVELA ) 800 MG tablet Take 3,200 mg by mouth 3 (three) times daily with meals. (Patient not taking: Reported on 06/23/2024)   Not Taking   venlafaxine  (EFFEXOR ) 37.5 MG tablet Take 37.5 mg by mouth 2 (two) times daily with a meal. (Patient not taking: Reported on 06/23/2024)   Not Taking   Social History   Socioeconomic History   Marital status: Single    Spouse name: Not on file   Number of children: Not on file   Years of education: Not on file   Highest education level: Not  on file  Occupational History   Not on file  Tobacco Use   Smoking status: Every Day    Current packs/day: 0.25    Average packs/day: 0.3 packs/day for 20.0 years (5.0 ttl pk-yrs)    Types: Cigarettes   Smokeless tobacco: Never   Tobacco comments:    smokes 1 - 1.5 cigarettes a day  Vaping Use   Vaping status: Never Used  Substance and Sexual Activity   Alcohol use: No    Comment: social   Drug use: No   Sexual activity: Not Currently    Birth control/protection: Post-menopausal  Other Topics Concern   Not on file  Social History Narrative   Lives alone   Social Drivers of Health   Tobacco Use: High Risk (07/01/2024)   Patient History    Smoking Tobacco Use: Every Day    Smokeless Tobacco Use: Never    Passive Exposure: Not on file  Financial Resource Strain: Low Risk  (05/31/2024)   Received from [facility]   Overall Financial Resource Strain (CARDIA)    Difficulty of Paying Living Expenses: Not hard at all  Food Insecurity: No Food Insecurity (07/02/2024)   Epic    Worried About Radiation Protection Practitioner of Food in the Last Year: Never true    Ran Out of Food in the Last Year: Never true  Transportation Needs:  No Transportation Needs (07/02/2024)   Epic    Lack of Transportation (Medical): No    Lack of Transportation (Non-Medical): No  Physical Activity: Not on file  Stress: Not on file  Social Connections: Not on file  Intimate Partner Violence: Not At Risk (07/02/2024)   Epic    Fear of Current or Ex-Partner: No    Emotionally Abused: No    Physically Abused: No    Sexually Abused: No  Depression (PHQ2-9): Low Risk (07/06/2023)   Depression (PHQ2-9)    PHQ-2 Score: 3  Alcohol Screen: Not on file  Housing: Low Risk (07/02/2024)   Epic    Unable to Pay for Housing in the Last Year: No    Number of Times Moved in the Last Year: 0    Homeless in the Last Year: No  Utilities: Not At Risk (07/02/2024)   Epic    Threatened with loss of utilities: No  Health Literacy: Not on file    Family History  Problem Relation Age of Onset   Hypertension Mother    Ovarian cancer Mother    Diabetes type II Mother    Hypertension Father    Kidney disease Father    Hypertension Sister    Diabetes type II Maternal Grandmother    Breast cancer Maternal Grandmother    Breast cancer Maternal Aunt    Hypertension Other    Cancer Other    Renal Disease Other       Review of systems complete and found to be negative unless listed above      PHYSICAL EXAM  Appears confused No significant murmur No pedal edema.  Labs:   Lab Results  Component Value Date   WBC 12.4 (H) 07/10/2024   HGB 10.2 (L) 07/10/2024   HCT 29.1 (L) 07/10/2024   MCV 86.6 07/10/2024   PLT 100 (L) 07/10/2024    Recent Labs  Lab 07/10/24 0412  NA 126*  K 4.1  CL 86*  CO2 21*  BUN 94*  CREATININE 4.01*  CALCIUM  10.7*  GLUCOSE 106*   Lab Results  Component Value Date  CKTOTAL 42 06/23/2014   CKMB 1.2 06/23/2014   TROPONINI 0.07 (HH) 08/25/2018    Lab Results  Component Value Date   CHOL 140 01/18/2019   CHOL 79 06/14/2014   CHOL 127 02/10/2014   Lab Results  Component Value Date   HDL 43 01/18/2019    HDL 35 (L) 06/14/2014   HDL 53 02/10/2014   Lab Results  Component Value Date   LDLCALC 67 01/18/2019   LDLCALC 27 06/14/2014   LDLCALC 53 02/10/2014   Lab Results  Component Value Date   TRIG 112 07/09/2024   TRIG 155 (H) 07/06/2024   TRIG 293 (H) 07/03/2024   Lab Results  Component Value Date   CHOLHDL 3.3 01/18/2019   No results found for: LDLDIRECT    Radiology: DG Abd 1 View Result Date: 07/10/2024 EXAM: 1 VIEW XRAY OF THE ABDOMEN 07/10/2024 09:51:00 AM COMPARISON: 07/04/2024 CLINICAL HISTORY: Encounter for nasogastric tube placement. ICD10: 747668 Encounter for nasogastric tube placement. FINDINGS: LINES, TUBES AND DEVICES: Enteric tube in place with distal tip and side port terminating within the expected location of the gastric body. BOWEL: Nonobstructive bowel gas pattern. SOFT TISSUES: Vascular calcifications. BONES: No acute fracture. PLEURAL SPACES: Left pleural effusion. LUNGS: Left basilar airspace opacity. IMPRESSION: 1. Enteric tube in place with distal tip and side port terminating within the expected location of the gastric body. Electronically signed by: Waddell Calk MD 07/10/2024 10:10 AM EST RP Workstation: [id number]   MR BRAIN WO CONTRAST Result Date: 07/07/2024 EXAM: MRI BRAIN WITHOUT CONTRAST 07/07/2024 11:00:58 PM TECHNIQUE: Multiplanar multisequence MRI of the head/brain was performed without the administration of intravenous contrast. COMPARISON: MRI head August 16, 2023 CLINICAL HISTORY: Encephalopathy. FINDINGS: BRAIN AND VENTRICLES: No acute infarct. Multifocal remote encephalomalacia with surrounding gliosis in the bilateral frontal lobes, right parietooccipital region, left parietal lobe, and cerebellum. No intracranial hemorrhage. No mass. No midline shift. No hydrocephalus. The sella is unremarkable. Chronic abnormal left vertebral artery flow void. ORBITS: No significant abnormality. SINUSES AND MASTOIDS: No significant abnormality. BONES AND SOFT  TISSUES: Normal marrow signal. No soft tissue abnormality. IMPRESSION: 1. No acute abnormality. 2. Multiple remote infarcts as above. Electronically signed by: Glendia Molt MD 07/07/2024 11:35 PM EST RP Workstation: [id number]   DG Abd 1 View Result Date: 07/04/2024 CLINICAL DATA:  Abdominal distension. EXAM: ABDOMEN - 1 VIEW COMPARISON:  Abdominal radiograph dated 07/02/2024. FINDINGS: No bowel dilatation or evidence of obstruction. No free air or radiopaque calculi. Atherosclerotic calcification of the abdominal aorta and iliac arteries. A cannula is noted to the right of the spine. Calcified uterine fibroid. No acute osseous pathology. IMPRESSION: Nonobstructive bowel gas pattern. Electronically Signed   By: Vanetta Chou M.D.   On: 07/04/2024 13:15   DG Abd 1 View Result Date: 07/02/2024 EXAM: 1 or 2 VIEW XRAY OF THE ABDOMEN 07/02/2024 09:53:25 PM COMPARISON: None available. CLINICAL HISTORY: Encounter for orogastric (OG) tube placement FINDINGS: LINES, TUBES AND DEVICES: Enteric tube tip is in the body of the stomach. The distal 5 cm of the tube is folded upon itself/kinked. Vascular stent in place overlying the expected region of the left common iliac vein. Cardiac paddles noted overlying the left hemiabdomen. BOWEL: Paucity of bowel gas. SOFT TISSUES: Vascular calcifications are seen in the abdomen and pelvis. BONES: No acute fracture. IMPRESSION: 1. Enteric tube tip in the body of the stomach, with the distal 5 cm folded upon itself and kinked. Electronically signed by: Greig Pique MD 07/02/2024 10:02 PM EST RP Workstation: [id number]  CT CERVICAL SPINE WO CONTRAST Result Date: 07/02/2024 EXAM: CT CERVICAL SPINE WITHOUT CONTRAST 07/02/2024 02:47:57 PM TECHNIQUE: CT of the cervical spine was performed without the administration of intravenous contrast. Multiplanar reformatted images are provided for review. Automated exposure control, iterative reconstruction, and/or weight based adjustment of  the mA/kV was utilized to reduce the radiation dose to as low as reasonably achievable. COMPARISON: None available. CLINICAL HISTORY: Acute severe posterior neck pain. FINDINGS: BONES AND ALIGNMENT: Interval collapse of the superior endplate of C6 and inferior endplate of C5 and exaggerated kyphosis may reflect marked progression of degenerative change. Sequelae of infection could have a similar appearance. Slight retrolisthesis at C5-C6, C6-C7 and C7-T1 are similar to the prior exam. DEGENERATIVE CHANGES: Ankylosis is present across the disc space anteriorly. Moderate irregular endplate changes are present at C6-C7 without loss of height. Mild endplate changes are present at C4-C5 without loss of height. The endplate collapse at C5-C6 is also a degenerative change. SOFT TISSUES: No prevertebral soft tissue swelling. Atherosclerotic changes are present in the proximal great vessels and at the carotid bifurcations bilaterally without definite stenosis. IMPRESSION: 1. Interval collapse of the superior endplate of C6 and inferior endplate of C5 with exaggerated kyphosis, which may reflect marked progression of degenerative change or sequelae of infection. MRI of the cervical spine without and with contrast may be useful for further evaluation. 2. Progressive degenerative endplate changes at C4-5 and C6-7 Electronically signed by: Lonni Necessary MD 07/02/2024 04:00 PM EST RP Workstation: [id number]   US  ASCITES (ABDOMEN LIMITED) Result Date: 07/02/2024 EXAM: LIMITED ABDOMINAL ULTRASOUND FOR ASCITES EVALUATION TECHNIQUE: Limited real-time sonography of all 4 quadrants of the abdomen was performed for evaluation of ascites. COMPARISON: US  Abdomen Limited 03/14/2024; 04/30/2023. CLINICAL HISTORY: Cirrhosis (HCC). FINDINGS: RIGHT UPPER QUADRANT: Moderate volume of ascites noted. LEFT UPPER QUADRANT: Moderate volume of ascites noted. RIGHT LOWER QUADRANT: Moderate volume of ascites noted. LEFT LOWER QUADRANT: Minimal  fluid in the left lower quadrant. OTHER: Moderate fluid midline. Limited visualization of the rest of the abdomen demonstrates no acute abnormality. IMPRESSION: 1. Moderate volume of ascites in the right upper quadrant, right lower quadrant, left upper quadrant, and midline. 2.  minimal fluid in the left lower quadrant. Electronically signed by: Norleen Boxer MD 07/02/2024 03:47 PM EST RP Workstation: [id number]   CT Cervical Spine Wo Contrast Result Date: 07/01/2024 EXAM: CT CERVICAL SPINE WITHOUT CONTRAST 07/01/2024 03:13:30 PM TECHNIQUE: CT of the cervical spine was performed without the administration of intravenous contrast. Multiplanar reformatted images are provided for review. Automated exposure control, iterative reconstruction, and/or weight based adjustment of the mA/kV was utilized to reduce the radiation dose to as low as reasonably achievable. COMPARISON: 06/22/2024 and 02/16/2022 CLINICAL HISTORY: Ataxia, cervical trauma FINDINGS: BONES AND ALIGNMENT: No acute fracture or traumatic malalignment. Focal kyphosis at C5-C6 is stable. Straightening of the normal cervical lordosis otherwise is stable. DEGENERATIVE CHANGES: Chronic endplate changes at C5-C6. SOFT TISSUES: Adjacent soft tissues are unremarkable. Calcifications are again seen within the thyroid  bilaterally. Atherosclerotic calcifications are present in the proximal great vessels and at the carotid bifurcations without definite stenosis. A right IJ catheter is in place. No prevertebral soft tissue swelling. IMPRESSION: 1. No acute cervical spine findings. 2. Stable chronic endplate changes and focal kyphosis at C5-6. Electronically signed by: Lonni Necessary MD 07/01/2024 03:57 PM EST RP Workstation: [id number]   CT HEAD WO CONTRAST ( ) Result Date: 07/01/2024 EXAM: CT HEAD WITHOUT CONTRAST 07/01/2024 03:13:30 PM TECHNIQUE: CT of the head was performed without the  administration of intravenous contrast. Automated exposure control,  iterative reconstruction, and/or weight based adjustment of the mA/kV was utilized to reduce the radiation dose to as low as reasonably achievable. COMPARISON: 06/22/2024 CLINICAL HISTORY: Head trauma, abnormal mental status (Age 40-64y). Fall today. FINDINGS: BRAIN AND VENTRICLES: No acute hemorrhage. No evidence of acute infarct. Chronic encephalomalacia within bilateral frontal, parietal, and right occipital lobes compatible with remote infarcts. Extensive periventricular and subcortical white matter low-density changes compatible with chronic microvascular ischemic change. No hydrocephalus. No extra-axial collection. No mass effect or midline shift. ORBITS: No acute abnormality. SINUSES: No acute abnormality. SOFT TISSUES AND SKULL: No acute soft tissue abnormality. No skull fracture. Atherosclerotic calcifications in large vessels of skull base. IMPRESSION: 1. No acute intracranial abnormality. 2. Chronic encephalomalacia within bilateral frontal, parietal, and right occipital lobes compatible with remote infarcts. 3. Extensive periventricular and subcortical white matter low-density changes compatible with chronic microvascular ischemic change. Electronically signed by: Lonni Necessary MD 07/01/2024 03:54 PM EST RP Workstation: [id number]   DG Wrist Complete Right Result Date: 07/01/2024 EXAM: 3 or more VIEW(S) XRAY OF THE RIGHT WRIST 07/01/2024 12:22:33 PM COMPARISON: None available. CLINICAL HISTORY: fall FINDINGS: BONES AND JOINTS: No acute fracture. No malalignment. Subchondral cysts in the base of the first metacarpal bone with degenerative changes of the first [facility] joint. SOFT TISSUES: Unremarkable. IMPRESSION: 1. No acute fracture or dislocation. Electronically signed by: Waddell Calk MD 07/01/2024 12:51 PM EST RP Workstation: [id number]   DG Pelvis Portable Result Date: 07/01/2024 EXAM: 1 or 2 VIEW(S) XRAY OF THE PELVIS 07/01/2024 12:22:33 PM COMPARISON: 01/13/2016 CLINICAL HISTORY: fall  FINDINGS: BONES AND JOINTS: No acute fracture. No malalignment. SOFT TISSUES: Likely calcified uterine fibroids. Severe atherosclerotic plaque. IMPRESSION: 1. No evidence of acute traumatic injury. Electronically signed by: Waddell Calk MD 07/01/2024 12:49 PM EST RP Workstation: [id number]   DG Chest Portable 1 View Result Date: 07/01/2024 EXAM: 1 VIEW(S) XRAY OF THE CHEST 07/01/2024 11:45:00 AM COMPARISON: 06/25/2024 CLINICAL HISTORY: SOB FINDINGS: LINES, TUBES AND DEVICES: Right chest wall dialysis catheter in place with tip overlying right atrium. Left brachial stent noted. LUNGS AND PLEURA: Pulmonary interstitial prominence with mild pulmonary edema. Decreased left pleural effusion. No pneumothorax. HEART AND MEDIASTINUM: Cardiomegaly. Coronary artery calcifications and stents. Surgical changes in mediastinum. Aortic calcification. BONES AND SOFT TISSUES: Intact sternotomy wires. IMPRESSION: 1. Pulmonary interstitial prominence with mild pulmonary edema, similar . 2. Decreased left pleural effusion. 3. Cardiomegaly. Electronically signed by: Waddell Calk MD 07/01/2024 11:55 AM EST RP Workstation: [id number]   DG Chest 1 View Result Date: 06/25/2024 CLINICAL DATA:  Pulmonary edema. EXAM: CHEST  1 VIEW COMPARISON:  02/28/2024 and CT chest 02/16/2022. FINDINGS: Right IJ dialysis catheter terminates in the right atrium. Trachea is midline. Heart is enlarged. There may be subtle interstitial prominence bilaterally. Probable left pleural effusion. Findings appear unchanged. IMPRESSION: Probable mild interstitial pulmonary edema and small left pleural effusion. Electronically Signed   By: Newell Eke M.D.   On: 06/25/2024 11:11   ECHOCARDIOGRAM COMPLETE Result Date: 06/23/2024    ECHOCARDIOGRAM REPORT   Patient Name:   [name] Date of Exam: 06/23/2024 Medical Rec #:  [id number]         Height:       66.0 in Accession #:    [id number]        Weight:       164.5 lb Date of Birth:  [birth year]           BSA:  1.840 m Patient Age:    63 years          BP:           101/73 mmHg Patient Gender: F                 HR:           107 bpm. Exam Location:  [facility] Procedure: 2D Echo, Color Doppler and Cardiac Doppler (Both Spectral and Color            Flow Doppler were utilized during procedure). STAT ECHO Indications:     Syncope R55  History:         Patient has prior history of Echocardiogram examinations, most                  recent 05/17/2023. COPD; Arrythmias:Atrial Fibrillation and                  Atrial Flutter.  Sonographer:     Christopher Furnace Referring Phys:  [id number] ROBET KIM Diagnosing Phys: Dwayne D Callwood MD IMPRESSIONS  1. Left ventricular ejection fraction, by estimation, is 25 to 30%. The left ventricle has severely decreased function. The left ventricle demonstrates global hypokinesis. The left ventricular internal cavity size was severely dilated. Left ventricular diastolic function could not be evaluated.  2. Right ventricular systolic function is severely reduced. The right ventricular size is severely enlarged. Mildly increased right ventricular wall thickness.  3. Left atrial size was mildly dilated.  4. Right atrial size was severely dilated.  5. The mitral valve is normal in structure. Mild mitral valve regurgitation.  6. Tricuspid valve regurgitation is severe.  7. The aortic valve is grossly normal. Aortic valve regurgitation is not visualized. Aortic valve sclerosis is present, with no evidence of aortic valve stenosis. FINDINGS  Left Ventricle: Left ventricular ejection fraction, by estimation, is 25 to 30%. The left ventricle has severely decreased function. The left ventricle demonstrates global hypokinesis. Strain was performed and the global longitudinal strain is indeterminate. The left ventricular internal cavity size was severely dilated. There is borderline left ventricular hypertrophy. Left ventricular diastolic function could not be evaluated. Right Ventricle: The right  ventricular size is severely enlarged. Mildly increased right ventricular wall thickness. Right ventricular systolic function is severely reduced. Left Atrium: Left atrial size was mildly dilated. Right Atrium: Right atrial size was severely dilated. Pericardium: There is no evidence of pericardial effusion. Mitral Valve: The mitral valve is normal in structure. Mild mitral valve regurgitation. Tricuspid Valve: The tricuspid valve is grossly normal. Tricuspid valve regurgitation is severe. Aortic Valve: The aortic valve is grossly normal. Aortic valve regurgitation is not visualized. Aortic valve sclerosis is present, with no evidence of aortic valve stenosis. Aortic valve mean gradient measures 3.0 mmHg. Aortic valve peak gradient measures 4.4 mmHg. Aortic valve area, by VTI measures 2.08 cm. Pulmonic Valve: The pulmonic valve was not well visualized. Pulmonic valve regurgitation is mild. Aorta: The aortic root was not well visualized. IAS/Shunts: No atrial level shunt detected by color flow Doppler. Additional Comments: 3D was performed not requiring image post processing on an independent workstation and was indeterminate.  LEFT VENTRICLE PLAX 2D LVIDd:         6.02 cm      Diastology LVIDs:         4.64 cm      LV e' medial:  4.64 cm/s LV PW:         1.11 cm  LV e' lateral: 8.32 cm/s LV IVS:        1.31 cm LVOT diam:     2.00 cm LV SV:         28 LV SV Index:   15 LVOT Area:     3.14 cm  LV Volumes (MOD) LV vol d, MOD A2C: 172.0 ml LV vol d, MOD A4C: 173.0 ml LV vol s, MOD A2C: 168.0 ml LV vol s, MOD A4C: 119.0 ml LV SV MOD A2C:     4.0 ml LV SV MOD A4C:     173.0 ml LV SV MOD BP:      33.8 ml RIGHT VENTRICLE RV Basal diam:  4.52 cm RV Mid diam:    3.20 cm LEFT ATRIUM             Index        RIGHT ATRIUM           Index LA diam:        3.40 cm 1.85 cm/m   RA Area:     28.70 cm LA Vol (A2C):   86.5 ml 47.00 ml/m  RA Volume:   110.00 ml 59.77 ml/m LA Vol (A4C):   67.8 ml 36.84 ml/m LA Biplane Vol: 78.1  ml 42.44 ml/m  AORTIC VALVE AV Area (Vmax):    1.81 cm AV Area (Vmean):   1.64 cm AV Area (VTI):     2.08 cm AV Vmax:           105.00 cm/s AV Vmean:          79.900 cm/s AV VTI:            0.133 m AV Peak Grad:      4.4 mmHg AV Mean Grad:      3.0 mmHg LVOT Vmax:         60.60 cm/s LVOT Vmean:        41.700 cm/s LVOT VTI:          0.088 m LVOT/AV VTI ratio: 0.66  AORTA Ao Root diam: 2.30 cm TRICUSPID VALVE TR Peak grad:   45.7 mmHg TR Vmax:        338.00 cm/s  SHUNTS Systemic VTI:  0.09 m Systemic Diam: 2.00 cm [name] MD Electronically signed by [name] MD Signature Date/Time: 06/23/2024/2:44:50 PM    Final    CT Cervical Spine Wo Contrast Result Date: 06/22/2024 EXAM: CT CERVICAL SPINE WITHOUT CONTRAST 06/22/2024 09:55:46 PM TECHNIQUE: CT of the cervical spine was performed without the administration of intravenous contrast. Multiplanar reformatted images are provided for review. Automated exposure control, iterative reconstruction, and/or weight based adjustment of the mA/kV was utilized to reduce the radiation dose to as low as reasonably achievable. COMPARISON: None available. CLINICAL HISTORY: Polytrauma, blunt. FINDINGS: BONES AND ALIGNMENT: Reversal of the normal mid cervical lordosis, chronic. No acute fracture or traumatic malalignment. DEGENERATIVE CHANGES: Severe erosive endplate changes at C5-C6, chronic but progressive when compared to remote prior. Differential considerations include chronic osteomyelitis versus severe degenerative changes with bony remodeling. SOFT TISSUES: No prevertebral soft tissue swelling. IMPRESSION: 1. No traumatic injury to the cervical spine. 2. Severe erosive endplate changes at C5-C6, chronic but progressive. Differential considerations include chronic osteomyelitis versus severe degenerative changes with bony remodeling. Correlate with laboratory evaluation to exclude infection. Electronically signed by: Pinkie Pebbles MD MD 06/22/2024 10:07 PM  EST RP Workstation: [id number]   CT HEAD WO CONTRAST ( ) Result Date: 06/22/2024 EXAM: CT HEAD WITHOUT  CONTRAST 06/22/2024 09:55:46 PM TECHNIQUE: CT of the head was performed without the administration of intravenous contrast. Automated exposure control, iterative reconstruction, and/or weight based adjustment of the mA/kV was utilized to reduce the radiation dose to as low as reasonably achievable. COMPARISON: MRI brain dated 08/16/2023. CLINICAL HISTORY: Polytrauma, blunt. FINDINGS: BRAIN AND VENTRICLES: No acute hemorrhage. No evidence of acute infarct. Remote infarcts in bilateral frontal lobes and right parietal lobe. Moderate white matter disease. Intracranial atherosclerosis. No hydrocephalus. No extra-axial collection. No mass effect or midline shift. ORBITS: No acute abnormality. SINUSES: No acute abnormality. SOFT TISSUES AND SKULL: No acute soft tissue abnormality. No skull fracture. IMPRESSION: 1. No acute intracranial abnormality. 2. 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 Electronically signed by: Pinkie Pebbles MD MD 06/22/2024 10:03 PM EST RP Workstation: [id number]   DG Chest Portable 1 View Result Date: 06/22/2024 EXAM: 1 VIEW XRAY OF THE CHEST 06/22/2024 09:30:41 PM COMPARISON: 02/28/2024. Prior CAG. CLINICAL HISTORY: weakness FINDINGS: LINES, TUBES AND DEVICES: Right dialysis catheter tip in the right atrium. LUNGS AND PLEURA: Vascular congestion. No overt edema. No focal pulmonary opacity. No pleural effusion. No pneumothorax. HEART AND MEDIASTINUM: Cardiomegaly. BONES AND SOFT TISSUES: No acute osseous abnormality. IMPRESSION: 1. Cardiomegaly with vascular congestion without overt edema. Electronically signed by: Franky Crease MD 06/22/2024 09:35 PM EST RP Workstation: [id number]    EKG: Sinus rhythm with PVCs on monitor  ASSESSMENT AND PLAN:  A-fib with RVR, now in sinus rhythm V. tach/V-fib arrest CAD Chronic heart failure with reduced EF ESRD on dialysis Anemia  Patient  extubated this morning.  Appears confused, not able to engage in conversation. Euvolemic on exam.  Volume status management with dialysis Continue p.o. amiodarone  Continue low-dose metoprolol  On midodrine  for blood pressure support Continue anticoagulation Palliative on board with ongoing goals of care discussion  Signed: Keller JAYSON Paterson MD 07/10/2024, 12:12 PM

## 2024-07-10 NOTE — Plan of Care (Signed)

## 2024-07-10 NOTE — Progress Notes (Signed)
 Pt extubated, Per order, to 3L [place] with no complications. Saturation on 3L is 94%.

## 2024-07-10 NOTE — Progress Notes (Signed)
 " [facility]  PROGRESS NOTE   Subjective:   Patient remains on the ventilator. However she is currently awake and can nod to questions. Currently dialysis catheter free.   Objective:  Vital signs: Blood pressure 118/65, pulse 67, temperature (!) 97.4 F (36.3 C), temperature source Axillary, resp. rate (!) 23, height 5' 6 (1.676 m), weight 78.7 kg, SpO2 100%.  Intake/Output Summary (Last 24 hours) at 07/10/2024 0818 Last data filed at 07/10/2024 0300 Gross per 24 hour  Intake 803.98 ml  Output --  Net 803.98 ml   Filed Weights   07/08/24 0435 07/09/24 0500 07/10/24 0409  Weight: 75.3 kg 75.4 kg 78.7 kg     Physical Exam: General:  No acute distress  Head:  Normocephalic, atraumatic.  ET tube, OG tube in place  Eyes:  Anicteric  Lungs:   Clear to auscultation, ventilator assisted  Heart:  S1S2 no rubs  Abdomen:   Soft, distended, hypoactive bowel sounds  Extremities:  + peripheral edema.  Neurologic:  Awake this a.m.  Skin:  Left foot wound  Access:  Right IJ dialysis catheter removed 07/08/2024    Basic Metabolic Panel: Recent Labs  Lab 07/04/24 0727 07/05/24 0439 07/06/24 0407 07/07/24 0412 07/08/24 0428 07/09/24 0425 07/10/24 0412  NA  --  131* 130* 129* 130* 129* 126*  K 3.9 3.7 4.0 4.1 3.7 4.0 4.1  CL  --  91* 90* 89* 89* 87* 86*  CO2  --  25 26 26 26 24  21*  GLUCOSE  --  79 96 108* 97 102* 106*  BUN  --  36* 29* 58* 48* 74* 94*  CREATININE  --  3.97* 2.83* 3.63* 2.51* 3.45* 4.01*  CALCIUM   --  10.2 10.0 10.9* 9.9 10.7* 10.7*  MG 1.9 2.4 2.1 2.1 2.1  --   --   PHOS  --  2.8 2.8 3.0 2.2* 3.1 3.6   GFR: Estimated Creatinine Clearance: 15.6 mL/min (A) (by C-G formula based on SCr of 4.01 mg/dL (H)).  Liver Function Tests: Recent Labs  Lab 07/05/24 0439 07/06/24 0407 07/07/24 0412 07/09/24 0425 07/10/24 0412  ALBUMIN  2.9* 3.0* 2.8* 2.8* 2.9*   No results for input(s): LIPASE, AMYLASE in the last 168 hours. No results for  input(s): AMMONIA in the last 168 hours.   CBC: Recent Labs  Lab 07/05/24 0439 07/06/24 0407 07/07/24 0412 07/09/24 0425 07/10/24 0412  WBC 13.7* 15.5* 17.1* 14.2* 12.4*  HGB 11.2* 11.7* 11.5* 10.3* 10.2*  HCT 32.6* 33.7* 32.1* 29.0* 29.1*  MCV 89.8 89.4 86.5 85.3 86.6  PLT 68* 78* 84* 90* 100*     HbA1C: No results found for: HGBA1C  Urinalysis: No results for input(s): COLORURINE, LABSPEC, PHURINE, GLUCOSEU, HGBUR, BILIRUBINUR, KETONESUR, PROTEINUR, UROBILINOGEN, NITRITE, LEUKOCYTESUR in the last 72 hours.  Invalid input(s): APPERANCEUR    Imaging: No results found.     Medications:    cefTRIAXone  (ROCEPHIN )  IV Stopped (07/09/24 2028)   heparin  1,450 Units/hr (07/10/24 0517)   propofol  (DIPRIVAN ) infusion Stopped (07/06/24 0808)   vancomycin  Stopped (07/07/24 1746)   vasopressin  0.02 Units/min (07/10/24 0502)    amiodarone   200 mg Per Tube TID   Chlorhexidine  Gluconate Cloth  6 each Topical Daily   collagenase   1 Application Topical Daily   feeding supplement (PROSource TF20)  60 mL Per Tube Daily   feeding supplement (VITAL AF 1.2 CAL)  1,000 mL Per Tube Q24H   free water   30 mL Per Tube Q4H   levothyroxine   75 mcg Per Tube Q0600   metoprolol  tartrate  12.5 mg Per Tube BID   midodrine   10 mg Per Tube TID WC   multivitamin  1 tablet Per Tube QHS   nutrition supplement (JUVEN)  1 packet Per Tube BID BM   mouth rinse  15 mL Mouth Rinse Q2H   pantoprazole  (PROTONIX ) IV  40 mg Intravenous Daily   sodium chloride  flush  10-40 mL Intracatheter Q12H   venlafaxine   37.5 mg Per Tube BID WC    Assessment/ Plan:     64 year old female with history of coronary disease history of CABG, ESRD, COPD, atrial fibrillation on hemodialysis Monday Wednesday and Friday now admitted with history of syncope/fall.  In hospital cardiac arrest on Sunday, 07/02/2024 Outpatient dialysis: [facility]/MWF/right PermCath/72.5 kg    ESRD:  HD Monday Wednesday and Friday.  Patient completed dialysis treatment last on Friday.  PermCath now removed.  Serum potassium 4.1 with serum bicarbonate of 21.  Serum sodium a bit lower at 126.  We will likely consider restarting dialysis tomorrow.  ANEMIA of chronic kidney disease: Hemoglobin 10.2 and acceptable.   MBD (secondary hyperparathyroidism): Maintain the patient on sodium thiosulfate  for calciphylaxis of the left foot.   Hypotension/VOL/edema:   Continues to have A-fib with RVR.  Cardiology team following along for chronic systolic CHF.  Patient maintained on amiodarone  and vasopressin .  Acute respiratory failure-requiring ventilator postcardiac arrest on 07/02/2024.  Patient continues to require ventilatory support.  Weaning protocol as per pulmonary/critical care.      LOS: 8 Marshayla Mitschke, MD [facility] 1/26/20268:18 AM  "

## 2024-07-11 ENCOUNTER — Inpatient Hospital Stay
Admit: 2024-07-11 | Discharge: 2024-07-11 | Disposition: A | Attending: Infectious Diseases | Admitting: Infectious Diseases

## 2024-07-11 DIAGNOSIS — R6521 Severe sepsis with septic shock: Secondary | ICD-10-CM | POA: Diagnosis not present

## 2024-07-11 DIAGNOSIS — N186 End stage renal disease: Secondary | ICD-10-CM | POA: Diagnosis not present

## 2024-07-11 DIAGNOSIS — I469 Cardiac arrest, cause unspecified: Secondary | ICD-10-CM | POA: Diagnosis not present

## 2024-07-11 DIAGNOSIS — R7881 Bacteremia: Secondary | ICD-10-CM | POA: Diagnosis not present

## 2024-07-11 DIAGNOSIS — I4901 Ventricular fibrillation: Secondary | ICD-10-CM | POA: Diagnosis not present

## 2024-07-11 DIAGNOSIS — A419 Sepsis, unspecified organism: Secondary | ICD-10-CM | POA: Diagnosis not present

## 2024-07-11 DIAGNOSIS — Z992 Dependence on renal dialysis: Secondary | ICD-10-CM | POA: Diagnosis not present

## 2024-07-11 DIAGNOSIS — B955 Unspecified streptococcus as the cause of diseases classified elsewhere: Secondary | ICD-10-CM | POA: Diagnosis not present

## 2024-07-11 DIAGNOSIS — I2510 Atherosclerotic heart disease of native coronary artery without angina pectoris: Secondary | ICD-10-CM | POA: Diagnosis not present

## 2024-07-11 DIAGNOSIS — R55 Syncope and collapse: Secondary | ICD-10-CM | POA: Diagnosis not present

## 2024-07-11 DIAGNOSIS — Z515 Encounter for palliative care: Secondary | ICD-10-CM | POA: Diagnosis not present

## 2024-07-11 LAB — GLUCOSE, CAPILLARY
Glucose-Capillary: 89 mg/dL (ref 70–99)
Glucose-Capillary: 84 mg/dL (ref 70–99)
Glucose-Capillary: 86 mg/dL (ref 70–99)
Glucose-Capillary: 85 mg/dL (ref 70–99)
Glucose-Capillary: 69 mg/dL — ABNORMAL LOW (ref 70–99)
Glucose-Capillary: 84 mg/dL (ref 70–99)

## 2024-07-11 LAB — ECHOCARDIOGRAM COMPLETE
AR max vel: 1.67 cm2
AV Area VTI: 1.56 cm2
AV Area mean vel: 1.6 cm2
AV Mean grad: 6 mmHg
AV Peak grad: 12 mmHg
Ao pk vel: 1.73 m/s
Calc EF: 30.3 %
Height: 66 in
S' Lateral: 6.4 cm
Single Plane A2C EF: 27.3 %
Single Plane A4C EF: 32.3 %
Weight: 2800.72 [oz_av]

## 2024-07-11 LAB — CBC
HCT: 30.4 % — ABNORMAL LOW (ref 36.0–46.0)
Hemoglobin: 10.2 g/dL — ABNORMAL LOW (ref 12.0–15.0)
MCH: 30.2 pg (ref 26.0–34.0)
MCHC: 33.6 g/dL (ref 30.0–36.0)
MCV: 89.9 fL (ref 80.0–100.0)
Platelets: 117 10*3/uL — ABNORMAL LOW (ref 150–400)
RBC: 3.38 MIL/uL — ABNORMAL LOW (ref 3.87–5.11)
RDW: 21.7 % — ABNORMAL HIGH (ref 11.5–15.5)
WBC: 13.9 10*3/uL — ABNORMAL HIGH (ref 4.0–10.5)
nRBC: 0.1 % (ref 0.0–0.2)

## 2024-07-11 LAB — BASIC METABOLIC PANEL WITH GFR
Anion gap: 21 — ABNORMAL HIGH (ref 5–15)
BUN: 101 mg/dL — ABNORMAL HIGH (ref 8–23)
CO2: 20 mmol/L — ABNORMAL LOW (ref 22–32)
Calcium: 11.2 mg/dL — ABNORMAL HIGH (ref 8.9–10.3)
Chloride: 86 mmol/L — ABNORMAL LOW (ref 98–111)
Creatinine, Ser: 4.86 mg/dL — ABNORMAL HIGH (ref 0.44–1.00)
GFR, Estimated: 10 mL/min — ABNORMAL LOW
Glucose, Bld: 83 mg/dL (ref 70–99)
Potassium: 4.8 mmol/L (ref 3.5–5.1)
Sodium: 126 mmol/L — ABNORMAL LOW (ref 135–145)

## 2024-07-11 LAB — HEPARIN LEVEL (UNFRACTIONATED): Heparin Unfractionated: 0.43 [IU]/mL (ref 0.30–0.70)

## 2024-07-11 LAB — VANCOMYCIN, RANDOM: Vancomycin Rm: 17 ug/mL

## 2024-07-11 LAB — PHOSPHORUS: Phosphorus: 5.2 mg/dL — ABNORMAL HIGH (ref 2.5–4.6)

## 2024-07-11 LAB — MAGNESIUM: Magnesium: 2.5 mg/dL — ABNORMAL HIGH (ref 1.7–2.4)

## 2024-07-11 MED ORDER — MIDODRINE HCL 5 MG PO TABS
15.0000 mg | ORAL_TABLET | Freq: Three times a day (TID) | ORAL | Status: DC
  Administered 2024-07-11 – 2024-07-16 (×17): 15 mg
  Filled 2024-07-11 (×18): qty 3

## 2024-07-11 MED ORDER — VANCOMYCIN HCL IN DEXTROSE 1-5 GM/200ML-% IV SOLN
1000.0000 mg | Freq: Once | INTRAVENOUS | Status: AC
  Administered 2024-07-11: 1000 mg via INTRAVENOUS
  Filled 2024-07-11: qty 200

## 2024-07-11 MED ORDER — DEXTROSE 50 % IV SOLN
INTRAVENOUS | Status: AC
  Filled 2024-07-11: qty 50

## 2024-07-11 MED ORDER — DEXTROSE 50 % IV SOLN
12.5000 g | INTRAVENOUS | Status: AC
  Administered 2024-07-11: 12.5 g via INTRAVENOUS

## 2024-07-11 NOTE — Progress Notes (Signed)
" °   07/11/24 1848  Vitals  BP 109/71  Pulse Rate (!) 56  ECG Heart Rate 62  Resp (!) 25  Weight 72.3 kg  Type of Weight Post-Dialysis  Oxygen  Therapy  SpO2 98 %  Patient Activity (if Appropriate) In bed  Pulse Oximetry Type Continuous  Oximetry Probe Site Changed No  During Treatment Monitoring  Blood Flow Rate (mL/min) 0 mL/min  Arterial Pressure (mmHg) -3.03 mmHg  Venous Pressure (mmHg) -1.61 mmHg  TMP (mmHg) 21.01 mmHg  Ultrafiltration Rate (mL/min) 1937 mL/min  Dialysate Flow Rate (mL/min) 300 ml/min  Dialysate Potassium Concentration 3  Dialysate Calcium  Concentration 2.5  Duration of HD Treatment -hour(s) 3.5 hour(s)  Cumulative Fluid Removed (mL) per Treatment  1750.45  Post Treatment  Dialyzer Clearance Clear  Hemodialysis Intake (mL) 0 mL  Liters Processed 82.3  Fluid Removed (mL) 1800 mL  Tolerated HD Treatment Yes  Post-Hemodialysis Comments removed. Systolic bp greater than 100. Access clean, dry and intact. No apparent distress  AVG/AVF Arterial Site Held (minutes) 0 minutes  AVG/AVF Venous Site Held (minutes) 0 minutes  Hemodialysis Catheter Right Femoral vein Triple lumen Temporary (Non-Tunneled)  Placement Date/Time: 07/11/24 1249   Placed prior to admission: No  Ultrasound Used?: Yes  Person Inserting LDA: Dr Isadora  Orientation: Right  Access Location: Femoral vein  Hemodialysis Catheter Type: Triple lumen Temporary (Non-Tunneled)  Site Condition No complications  Blue Lumen Status Antimicrobial dead end cap;Heparin  locked  Red Lumen Status Antimicrobial dead end cap;Heparin  locked  Purple Lumen Status Infusing  Catheter fill solution Heparin  1000 units/ml  Catheter fill volume (Arterial) 1400 cc  Catheter fill volume (Venous) 1400  Dressing Type Transparent  Dressing Status Antimicrobial disc/dressing in place;Clean, Dry, Intact  Drainage Description None  Post treatment catheter status Capped and Clamped    "

## 2024-07-11 NOTE — Plan of Care (Signed)
  Problem: Coping: Goal: Level of anxiety will decrease Outcome: Progressing   Problem: Elimination: Goal: Will not experience complications related to bowel motility Outcome: Progressing   Problem: Pain Managment: Goal: General experience of comfort will improve and/or be controlled Outcome: Progressing   Problem: Safety: Goal: Ability to remain free from injury will improve Outcome: Progressing

## 2024-07-11 NOTE — Progress Notes (Addendum)
 Nutrition Follow Up Note   DOCUMENTATION CODES:   Not applicable  INTERVENTION:   Recommend enteral nutrition via NGT if patient remains NPO  If tube feeds initiated, recommend:  Osmolite 1.5@60ml /hr- Initiate at 52ml/hr and increase by 10ml/hr q8 hours until goal rate is reached.   Free water  flushes 30ml q4 hours to maintain tube patency   Regimen provides 2160kcal/day, 90g/day protein and 1267ml/day of free water .   RD will add supplements with diet advancement   Rena-vit po daily with diet advancement   Juven Fruit Punch BID via tube, each serving provides 95kcal and 2.5g of protein (amino acids glutamine and arginine)  Pt remains at high refeed risk; recommend monitor potassium, magnesium  and phosphorus labs daily until stable  Daily weights   NUTRITION DIAGNOSIS:   Inadequate oral intake related to inability to eat (pt sedated and ventilated) as evidenced by NPO status. -ongoing   GOAL:   Patient will meet greater than or equal to 90% of their needs -not met   MONITOR:   Diet advancement, Labs, Weight trends, Skin, I & O's  ASSESSMENT:   64 y/o female with h/o ESRD on HD, CAD s/p CABG, ischemic cardiomyopathy, dementia, CVA, PVD, PAF, COPD, HLD, hypothyroid disease, CHF, HCV, cirrhosis, HTN,  DDD, chronic pain, GERD, bipolar disorder, gout, diverticulosis and pulmonary hypertension who is admitted with cardiac arrest, bacteremia and septic shock.  Pt extubated 1/26. NGT remains in place. Per MD, hold nutrition for tonight. RD will add supplements with diet advancement. Would recommend tube feed initiation via NGT if pt remains NPO or if her oral intake is poor. Pt remains at high refeed risk. Per chart, pt appears to be at her UBW currently. Plan to resume HD.    Medications reviewed and include: synthroid , midodrine , rena-vit, juven, vancomycin , heparin , vasopressin    Labs reviewed: Na 126(L), K 4.8 wnl, BUN 101(H), creat 4.86(H), P 5.2(H), Mg 2.5(H) Wbc-  13.9(H), Hgb 10.2(L), Hct 30.4(L) Cbgs- 85, 86, 84, 89 x 24 hrs   Diet Order:   Diet Order     None      EDUCATION NEEDS:   No education needs have been identified at this time  Skin:  Skin Assessment: Reviewed RN Assessment (ulcers L foot and heel, Stage II sacrum)  Last BM:  1/27- type 6  Height:   Ht Readings from Last 1 Encounters:  07/01/24 5' 6 (1.676 m)    Weight:   Wt Readings from Last 1 Encounters:  07/11/24 74.1 kg    Ideal Body Weight:  59 kg  BMI:  Body mass index is 26.37 kg/m.  Estimated Nutritional Needs:   Kcal:  1800-2100kcal/day  Protein:  90-105g/day  Fluid:  UOP +1L  Augustin Shams MS, RD, LDN If unable to be reached, please send secure chat to RD inpatient available from 8:00a-4:00p daily

## 2024-07-11 NOTE — Progress Notes (Signed)
 " [facility]  PROGRESS NOTE   Subjective:   Patient now extubated. Still awaiting temporary dialysis catheter placement. We plan to restart dialysis once access reestablished.   Objective:  Vital signs: Blood pressure 111/74, pulse 60, temperature 97.9 F (36.6 C), temperature source Axillary, resp. rate 18, height 5' 6 (1.676 m), weight 79.4 kg, SpO2 98%.  Intake/Output Summary (Last 24 hours) at 07/11/2024 0755 Last data filed at 07/11/2024 0700 Gross per 24 hour  Intake 973.35 ml  Output 600 ml  Net 373.35 ml   Filed Weights   07/09/24 0500 07/10/24 0409 07/11/24 0423  Weight: 75.4 kg 78.7 kg 79.4 kg     Physical Exam: General:  No acute distress  Head:  Normocephalic, atraumatic.    Eyes:  Anicteric  Lungs:   Clear to auscultation, normal effort  Heart:  S1S2 no rubs  Abdomen:   Soft, distended  Extremities:  + peripheral edema.  Neurologic: Awake but confused  Skin:  Left foot wound  Access:  Right IJ dialysis catheter removed 07/08/2024    Basic Metabolic Panel: Recent Labs  Lab 07/05/24 0439 07/06/24 0407 07/07/24 0412 07/08/24 0428 07/09/24 0425 07/10/24 0412 07/11/24 0425  NA 131* 130* 129* 130* 129* 126* 126*  K 3.7 4.0 4.1 3.7 4.0 4.1 4.8  CL 91* 90* 89* 89* 87* 86* 86*  CO2 25 26 26 26 24  21* 20*  GLUCOSE 79 96 108* 97 102* 106* 83  BUN 36* 29* 58* 48* 74* 94* 101*  CREATININE 3.97* 2.83* 3.63* 2.51* 3.45* 4.01* 4.86*  CALCIUM  10.2 10.0 10.9* 9.9 10.7* 10.7* 11.2*  MG 2.4 2.1 2.1 2.1  --   --  2.5*  PHOS 2.8 2.8 3.0 2.2* 3.1 3.6 5.2*   GFR: Estimated Creatinine Clearance: 12.9 mL/min (A) (by C-G formula based on SCr of 4.86 mg/dL (H)).  Liver Function Tests: Recent Labs  Lab 07/05/24 0439 07/06/24 0407 07/07/24 0412 07/09/24 0425 07/10/24 0412  ALBUMIN  2.9* 3.0* 2.8* 2.8* 2.9*   No results for input(s): LIPASE, AMYLASE in the last 168 hours. No results for input(s): AMMONIA in the last 168  hours.   CBC: Recent Labs  Lab 07/06/24 0407 07/07/24 0412 07/09/24 0425 07/10/24 0412 07/11/24 0425  WBC 15.5* 17.1* 14.2* 12.4* 13.9*  HGB 11.7* 11.5* 10.3* 10.2* 10.2*  HCT 33.7* 32.1* 29.0* 29.1* 30.4*  MCV 89.4 86.5 85.3 86.6 89.9  PLT 78* 84* 90* 100* 117*     HbA1C: No results found for: HGBA1C  Urinalysis: No results for input(s): COLORURINE, LABSPEC, PHURINE, GLUCOSEU, HGBUR, BILIRUBINUR, KETONESUR, PROTEINUR, UROBILINOGEN, NITRITE, LEUKOCYTESUR in the last 72 hours.  Invalid input(s): APPERANCEUR    Imaging: DG Abd 1 View Result Date: 07/10/2024 EXAM: 1 VIEW XRAY OF THE ABDOMEN 07/10/2024 09:51:00 AM COMPARISON: 07/04/2024 CLINICAL HISTORY: Encounter for nasogastric tube placement. ICD10: 747668 Encounter for nasogastric tube placement. FINDINGS: LINES, TUBES AND DEVICES: Enteric tube in place with distal tip and side port terminating within the expected location of the gastric body. BOWEL: Nonobstructive bowel gas pattern. SOFT TISSUES: Vascular calcifications. BONES: No acute fracture. PLEURAL SPACES: Left pleural effusion. LUNGS: Left basilar airspace opacity. IMPRESSION: 1. Enteric tube in place with distal tip and side port terminating within the expected location of the gastric body. Electronically signed by: Waddell Calk MD 07/10/2024 10:10 AM EST RP Workstation: [id number]       Medications:    heparin  1,450 Units/hr (07/11/24 0249)   vasopressin  0.01 Units/min (07/11/24 0340)    amiodarone   200 mg Per Tube TID   Chlorhexidine  Gluconate Cloth  6 each Topical Daily   collagenase   1 Application Topical Daily   free water   30 mL Per Tube Q4H   levothyroxine   75 mcg Per Tube Q0600   metoprolol  tartrate  12.5 mg Per Tube BID   midodrine   10 mg Per Tube TID WC   multivitamin  1 tablet Per Tube QHS   nutrition supplement (JUVEN)  1 packet Per Tube BID BM   sodium chloride  flush  10-40 mL Intracatheter Q12H   vancomycin  variable  dose per unstable renal function (pharmacist dosing)   Does not apply See admin instructions    Assessment/ Plan:     64 year old female with history of coronary disease history of CABG, ESRD, COPD, atrial fibrillation on hemodialysis Monday Wednesday and Friday now admitted with history of syncope/fall.  In hospital cardiac arrest on Sunday, 07/02/2024 Outpatient dialysis: [facility]/MWF/right PermCath/72.5 kg    ESRD: HD Monday Wednesday and Friday.  Case discussed with critical care yesterday.  We will plan for hemodialysis treatment once access reestablished.  Hopefully this will be today.  ANEMIA of chronic kidney disease: Hemoglobin 10.2 at last check.   MBD (secondary hyperparathyroidism): Maintain the patient on sodium thiosulfate  for calciphylaxis of the left foot.   Hypotension/VOL/edema:   Continues to have A-fib with RVR.  Cardiology team following along for chronic systolic CHF.  Continue amiodarone  and vasopressin  for now.  Hopefully can be weaned off of vasopressin  by the time we need to perform dialysis.  Acute respiratory failure-requiring ventilator postcardiac arrest on 07/02/2024.  Status post extubation 07/11/2024.  Breathing comfortably at the moment.      LOS: 9 Vanessa Pfister, MD [facility] 1/27/20267:55 AM  "

## 2024-07-11 NOTE — Procedures (Signed)
 Central Venous Catheter Insertion Procedure Note  [address]  [id number]  Oct 25, 1960  Date:07/11/24  Time:12:41 PM   Provider Performing:Kenniel Bergsma   Procedure: Insertion of Non-tunneled Central Venous [phone]) with US  guidance (23062)   Indication(s) Medication administration and Hemodialysis  Consent Risks of the procedure as well as the alternatives and risks of each were explained to the patient and/or caregiver.  Consent for the procedure was obtained and is signed in the bedside chart  Anesthesia Topical only with 1% lidocaine    Timeout Verified patient identification, verified procedure, site/side was marked, verified correct patient position, special equipment/implants available, medications/allergies/relevant history reviewed, required imaging and test results available.  Sterile Technique Maximal sterile technique including full sterile barrier drape, hand hygiene, sterile gown, sterile gloves, mask, hair covering, sterile ultrasound probe cover (if used).  Procedure Description Area of catheter insertion was cleaned with chlorhexidine  and draped in sterile fashion.  With real-time ultrasound guidance a HD catheter was placed into the right femoral vein. Nonpulsatile blood flow and easy flushing noted in all ports.  The catheter was sutured in place and sterile dressing applied.  Complications/Tolerance None; patient tolerated the procedure well. Chest X-ray is ordered to verify placement for internal jugular or subclavian cannulation.   Chest x-ray is not ordered for femoral cannulation.  EBL Minimal  Specimen(s) None  Belva November, MD [facility] 07/11/2024 12:41 PM

## 2024-07-11 NOTE — Consult Note (Signed)
 PHARMACY CONSULT NOTE - ELECTROLYTES  Pharmacy Consult for Electrolyte Monitoring and Replacement   Recent Labs: Height: 5' 6 (167.6 cm) Weight: 79.4 kg (175 lb 0.7 oz) IBW/kg (Calculated) : 59.3 Estimated Creatinine Clearance: 12.9 mL/min (A) (by C-G formula based on SCr of 4.86 mg/dL (H)). Potassium (mmol/L)  Date Value  07/11/2024 4.8  07/11/2014 4.7   Magnesium  (mg/dL)  Date Value  98/72/7973 2.5 (H)  04/10/2014 1.9   Calcium  (mg/dL)  Date Value  98/72/7973 11.2 (H)   Calcium , Total (mg/dL)  Date Value  98/74/7983 8.3 (L)   Albumin  (g/dL)  Date Value  98/73/7973 2.9 (L)  07/07/2014 3.1 (L)   Phosphorus (mg/dL)  Date Value  98/72/7973 5.2 (H)  07/09/2014 3.9   Sodium (mmol/L)  Date Value  07/11/2024 126 (L)  07/09/2014 139   Assessment  [name] is a 64 y.o. female presenting with syncope/in-hospital cardiac arrest. PMH significant for ESRD HD MWF, AF on eliquis , HFrEF, COPD, prior CVA. Pharmacy has been consulted to monitor and replace electrolytes.  Diet: Free water  flushes at 30 mL every 4 hours  Goal of Therapy:  Within normal limits  Plan:  Mild hyponatremia, stable Re-check renal function panel tomorrow  Thank you for allowing pharmacy to be a part of this patient's care.  Belvie Macintosh, PharmD Candidate 07/11/2024 7:49 AM

## 2024-07-11 NOTE — Progress Notes (Signed)
 " Daily Progress Note   Patient Name: [name]       Date: 07/11/2024 DOB: 1961-01-29  Age: 64 y.o. MRN#: [id number] Attending Physician: Isadora Hose, MD Primary Care Physician: Orlean Alan HERO, FNP Admit Date: 07/01/2024 Length of Stay: 9 days  Reason for Consultation/Follow-up: Establishing goals of care  Subjective:   CC: Establishing goals of care  Subjective: Chart reviewed including most recent notes from hospitalist and palliative care.  Note from PMT yesterday noting brother asking about family meeting and also stating he was unaware of DNR status.  Discussed with patient's brother, [name], today.  He reports that he has not been able to arrange a family meeting due to work schedules and restrictions and traveling with current weather.  He requested I call and speak with patient's daughter, [name], and his sister, [name] today as we were unable to have a family meeting in person.  I discussed with multiple family members during 3 separate phone calls regarding advance care planning, limitations of care, and surrogate decision maker today.  Patient had previously said that she would want her brother and sister to act as surrogate, however, she did not complete paperwork outlining this.  Therefore, legally, her daughters will be her surrogate decision makers.  Family has been working together, however, and are all in agreement with plans at this point.  I called first and spoke with the patient's daughter, [name]  We discussed her mother's clinical course, including multiple concerns for encephalopathy, recurrent infections, nutrition and hydration, and long-term goals if she does not have improvement in encephalopathy.  Her daughter reports understanding situation and states that family would like to see how she continues to progress but understands this may be a new baseline and she may have continued decline.  We talked about limitations of care and she had and reports that  she and her sister made decision for DNR at the time that she had her cardiac arrest.  This was conveyed to critical care team and order for DNR was placed at that time.  Confirmed that family is in agreement with this plan of care moving forward including continuation of DNR.  She reports that this is something they have discussed as a family and she is not sure why this would come as a surprise to patient's brother..  I called and reviewed with patient's sister, [name] as well.  She reports that she has a medical background and understands multiple comorbidities that we discussed in detail.  She understands that she is likely to continue to decline and reports that she has been working with her brother to help him understand severity of her condition.  States that while they would love for her to get better, they understand that that would likely require a miracle and they are really focused on adding as much time and quality to her life.  This includes continuation of current care while we see how her mental status and nutrition progress over the next few days.  She also expressed understanding of DNR status and agrees this is appropriate.  Review of Systems Unable to obtain  Objective:   Vital Signs:  BP 108/64   Pulse 60   Temp 98.5 F (36.9 C) (Axillary)   Resp 20   Ht 5' 6 (1.676 m)   Wt 74.1 kg   SpO2 90%   BMI 26.37 kg/m   Physical Exam: General: Awake, tracks but does not answer questions HENT: normocephalic, atraumatic, moist mucous membranes Cardiovascular:  RRR Respiratory: no increased work of breathing noted, not in respiratory distress Abdomen: not distended Skin: no rashes or lesions on visible skin  Assessment & Plan:   Assessment: 64 year old female with history of end-stage renal disease on HD, COPD, CHF, bipolar disorder, chronic hypotension on midodrine , HLD and prior CVA who is currently admitted with respiratory failure, V-fib arrest, gastroparesis, and  gram-positive bacteremia.  She has been extubated but remains encephalopathic.  Palliative following for goals of care.  Recommendations/Plan: # Complex medical decision making/goals of care:  - DNR/DNI.  Confirmed this with daughter, [name], via phone today.  She and her sister would be legal decision makers as patient does not have HCPOA paperwork.  There has been heavy involvement from the rest of family including patient's brother, [name], and sister [name]  - Palliative care to continue to follow and progress conversation with family based upon her clinical course in the next few days.  -  Code Status: Limited: Do not attempt resuscitation (DNR) -DNR-LIMITED -Do Not Intubate/DNI   Prognosis: guarded  # Psychosocial Support:  - Family, including children, brother, and sister  # Discharge Planning: To Be Determined  Discussed with: Daughter, brother, sister, Dr. Isadora  Thank you for allowing the palliative care team to participate in the care [name]  Amaryllis Meissner, MD Palliative Care Provider PMT # [phone]  If patient remains symptomatic despite maximum doses, please call PMT at [phone] between 0700 and 1900. Outside of these hours, please call attending, as PMT does not have night coverage.   I personally spent a total of 60 minutes in the care of the patient today including preparing to see the patient, getting/reviewing separately obtained history, performing a medically appropriate exam/evaluation, counseling and educating, referring and communicating with other health care professionals, and documenting clinical information in the EHR.  "

## 2024-07-11 NOTE — Progress Notes (Signed)
 Pharmacy Antibiotic Note  [name] is a 64 y.o. female admitted on 07/01/2024 with bacteremia.  Pharmacy has been consulted for vancomycin  dosing. Patient recently admitted for syncope from uncertain cause and discharged.  Patient found down at home and EMS called.  She is ESRD on HD.  She had in-hospital cardiac arrest on 07/02/2024  Today, 07/11/2024 Day 6 vancomyci, complete 7 days of Ceftriaxone  1/25 Renal: ESRD on HD MWF - has AVF (s/p ligation for steal syndrome in Nov) and HD catheter HD catheter removed 1/24 Afebrile WBC 13.9 1/18 blood cx R AC at 2125 with GPC in both bottles - S. Gordonii and MRSE 1/18 blood cx R hand at 2016 with GPC -MRSA 1/20 blood cx: NGTD 1/21 peritoneal fluid: NG-final Fluid: 609 Nucleate cells (44% neuts for absolute PMN count = 268) Last ECHO done 06/23/24, ECHO done 1/27  Vancomycin  levels:  Random vancomycin  level 1/27 = 17 mcg/mL, last dose Fri 1/23 PRIOR to HD.    Plan: PCCM placing temporary HD catheter today with plans for HD after.  Will plan to give vancomycin  1gm IV x1 after HD today.   Goal pre-HD vancomycin  level 15-25 mcg/mL Follow-up plan for subsequent dialysis   Height: 5' 6 (167.6 cm) Weight: 79.4 kg (175 lb 0.7 oz) IBW/kg (Calculated) : 59.3  Temp (24hrs), Avg:98.1 F (36.7 C), Min:97.8 F (36.6 C), Max:98.3 F (36.8 C)  Recent Labs  Lab 07/06/24 0407 07/07/24 0412 07/08/24 0428 07/09/24 0425 07/10/24 0412 07/11/24 0425  WBC 15.5* 17.1*  --  14.2* 12.4* 13.9*  CREATININE 2.83* 3.63* 2.51* 3.45* 4.01* 4.86*  VANCORANDOM  --   --   --   --   --  17    Estimated Creatinine Clearance: 12.9 mL/min (A) (by C-G formula based on SCr of 4.86 mg/dL (H)).    Allergies[1]  Antimicrobials this admission: 1/18 doxycycline  >> 1/19 1/19 ceftriaxone  >>1/25 1/22 vancomycin  >>  Thank you for allowing pharmacy to be a part of this patients care.  Brevan Luberto, PharmD, BCPS, BCIDP Work Cell: [phone] 07/11/2024  11:09 AM        [1]  Allergies Allergen Reactions   Morphine  And Codeine Hives   Levaquin [Levofloxacin] Itching and Other (See Comments)    Severe itching; prickly sensation; severe left leg pain; immobility.   Enalapril  Other (See Comments)    hallucinations   Latex Rash   Tape Rash and Other (See Comments)

## 2024-07-11 NOTE — Progress Notes (Signed)
 PHARMACY - ANTICOAGULATION CONSULT NOTE  Pharmacy Consult for UFH infusion Indication: atrial fibrillation  Allergies[1]  Patient Measurements: Height: 5' 6 (167.6 cm) Weight: 79.4 kg (175 lb 0.7 oz) IBW/kg (Calculated) : 59.3 HEPARIN  DW (KG): 70.5  Labs: Recent Labs    07/09/24 0425 07/10/24 0412 07/11/24 0425  HGB 10.3* 10.2* 10.2*  HCT 29.0* 29.1* 30.4*  PLT 90* 100* 117*  HEPARINUNFRC 0.41 0.50 0.43  CREATININE 3.45* 4.01*  --     Estimated Creatinine Clearance: 15.7 mL/min (A) (by C-G formula based on SCr of 4.01 mg/dL (H)).   Medical History: Past Medical History:  Diagnosis Date   Acute lacunar infarction (HCC) 03/03/2014   a.) MRI brain 03/03/2014: two acute punctate lacunar infarcts anteriorly in the BILATERAL centrum semiovale   Adult behavior problems    Anemia of chronic renal failure    Aortic atherosclerosis    Arthritis    Atrial fibrillation and flutter (HCC)    a.) CHA2DS2VASc = 6 (sex, HFrEF, HTN, CVA x 2, prior MI/vascular disease) as of 02/04/2024; b.) rate/rhythm maintained on oral metoprolol  succinate; chronically anticoagulated with apixaban    Bipolar disorder (HCC)    Cardiomegaly    Cerebral microvascular disease    CHF (congestive heart failure) (HCC)    Chicken pox    Cholelithiasis    Chronic respiratory failure with hypoxia (HCC) 12/19/2021   COPD (chronic obstructive pulmonary disease) (HCC)    Coronary artery disease    DDD (degenerative disc disease), cervical    DDD (degenerative disc disease), thoracolumbar    Diverticulosis    Dyspnea    ESRD (end stage renal disease) on dialysis (M,W,F)    GERD (gastroesophageal reflux disease)    Gout    HCV (hepatitis C virus)    Headache    HFrEF (heart failure with reduced ejection fraction) (HCC)    History of delirium 04/12/2014   History of substance abuse (HCC)    a.) tobacco + cocaine   Hyperkalemia 11/13/2014   Hyperlipidemia    Hypertension    Hypotension 02/24/2014    Hypothyroidism    Ischemic cardiomyopathy    Nose colonized with MRSA 02/01/2024   a.) presurgical PCR (+) on 02/01/2024 prior to A/V FISTULAGRAM; b.) presurgical PCR (+) on 03/07/2024 prior to AV FISTULA CREATION   NSTEMI (non-ST elevated myocardial infarction) (HCC) 10/08/2013   NSTEMI (non-ST elevated myocardial infarction) (HCC) 01/23/2002   a.) LHC 01/24/2002 --> multi-vessel CAD with IRA being 90% OM2 --> delayed PCI until 01/25/2002 --> 3.0 x 23 mm BX Velocity stent   Obesity    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    On apixaban  therapy    Peripheral vascular disease    Pneumonia    Postoperative anemia due to acute blood loss 02/27/2014   Postoperative cerebrovascular infarction following cardiac surgery 02/21/2014   a.) developed RIGHT upper/lower extremity weakness following cardiac revascularization --> neuro consult and head CT --> Hypodensity noted in the area of the LEFT motor cortex consistent with a component of acute ischemia   Pulmonary hypertension (HCC)    RBBB (right bundle branch block)    Renal osteodystrophy    S/P CABG x 3 02/20/2014   a.) LIMA-LAD, SVG-OM1, SVG-PDA   Stroke (HCC) 05/23/2010   a.) brain MRI 05/23/2010: BILATERAL acute infarcts and old prior infarcts; RIGHT frontal lobe extending into the white matter. Small focus of restricted diffusion in the cortex of the medial LEFT frontoparietal lobe. Periventricular T2 and multiple foci of T2  hyperintensity in the subcortical white matter and increased FLAIR signal in the cortex of the LEFT frontal lobe.   Syncope and collapse 07/25/2020   Assessment: 64 y/o F with a h/o atrial fibrillation on apixaban . Last dose was 24-Jul-2024 at 0900. Patient coded on 07-24-24 with subsequent ROSC and is now in the ICU on ventilation and pressor support. Pharmacy consulted for IV heparin .   Goal of Therapy:  anti-Xa level  0.3-0.7 units/ml Monitor platelets by anticoagulation protocol: Yes   Plan:  1/27: HL @ 0425 =  0.43, therapeutic X 6  --Continue heparin  infusion at 1450 units/hr --Re-check HL daily w/ AM labs while therapeutic --Daily CBC  [name] D 07/11/2024 6:37 AM        [1]  Allergies Allergen Reactions   Morphine  And Codeine Hives   Levaquin [Levofloxacin] Itching and Other (See Comments)    Severe itching; prickly sensation; severe left leg pain; immobility.   Enalapril  Other (See Comments)    hallucinations   Latex Rash   Tape Rash and Other (See Comments)

## 2024-07-11 NOTE — Plan of Care (Signed)
" °  Problem: Clinical Measurements: Goal: Ability to maintain clinical measurements within normal limits will improve Outcome: Progressing Goal: Will remain free from infection Outcome: Progressing Goal: Cardiovascular complication will be avoided Outcome: Progressing   Problem: Activity: Goal: Risk for activity intolerance will decrease Outcome: Progressing   Problem: Elimination: Goal: Will not experience complications related to bowel motility Outcome: Progressing Goal: Will not experience complications related to urinary retention Outcome: Progressing   Problem: Pain Managment: Goal: General experience of comfort will improve and/or be controlled Outcome: Progressing   Problem: Safety: Goal: Ability to remain free from injury will improve Outcome: Progressing   "

## 2024-07-11 NOTE — Progress Notes (Cosign Needed)
 [facility] CLINIC CARDIOLOGY PROGRESS NOTE   Patient ID: [name] MRN: [id number] DOB/AGE: 20-Jan-1961 64 y.o.  Admit date: 07/01/2024 Referring Physician Dr. Devaughn Ban  Primary Physician Orlean Alan HERO, FNP  Primary Cardiologist Dr. Denyse Bathe Reason for Consultation syncope  HPI: [name] is a 64 y.o. female with a past medical history of chronic HFrEF, coronary artery disease, atrial fibrillation on eliquis , COPD, ESRD on HD, bipolar disorder, hypotension on midodrine , hx CVA  who presented to the ED on 07/01/2024 for syncopal event at home. Cardiology was consulted for further evaluation.   Interval History:  -Patient seen and examined this AM, family at bedside. Still with confusion.  - Sinus bradycardia on tele with occasional PVCs. - Still requiring low dose vasopressin .  Review of systems complete and found to be negative unless listed above   Vitals:   07/11/24 0925 07/11/24 0930 07/11/24 0940 07/11/24 0945  BP:  112/70  (!) 102/54  Pulse: (!) 42 (!) 39 (!) 54 (!) 45  Resp: 19 19 (!) 21 (!) 27  Temp:      TempSrc:      SpO2: 95% 95% 92% 94%  Weight:      Height:         Intake/Output Summary (Last 24 hours) at 07/11/2024 1022 Last data filed at 07/11/2024 0700 Gross per 24 hour  Intake 973.35 ml  Output 600 ml  Net 373.35 ml     PHYSICAL EXAM General: Ill appearing female, no acute distress.  HEENT: Normocephalic and atraumatic. Neck: No JVD.  Lungs: Diminished bilaterally.  Heart: HRRR. Normal S1 and S2 without gallops or murmurs. Radial & DP pulses 2+ bilaterally. Abdomen: Non-distended appearing.  Msk: Normal strength and tone for age. Extremities: No clubbing, cyanosis or edema.     LABS: Basic Metabolic Panel: Recent Labs    07/10/24 0412 07/11/24 0425  NA 126* 126*  K 4.1 4.8  CL 86* 86*  CO2 21* 20*  GLUCOSE 106* 83  BUN 94* 101*  CREATININE 4.01* 4.86*  CALCIUM  10.7* 11.2*  MG  --  2.5*  PHOS 3.6 5.2*   Liver  Function Tests: Recent Labs    07/09/24 0425 07/10/24 0412  ALBUMIN  2.8* 2.9*   No results for input(s): LIPASE, AMYLASE in the last 72 hours. CBC: Recent Labs    07/10/24 0412 07/11/24 0425  WBC 12.4* 13.9*  HGB 10.2* 10.2*  HCT 29.1* 30.4*  MCV 86.6 89.9  PLT 100* 117*   Cardiac Enzymes: No results for input(s): CKTOTAL, CKMB, CKMBINDEX, TROPONINIHS in the last 72 hours. BNP: No results for input(s): BNP in the last 72 hours. D-Dimer: No results for input(s): DDIMER in the last 72 hours. Hemoglobin A1C: No results for input(s): HGBA1C in the last 72 hours. Fasting Lipid Panel: Recent Labs    07/09/24 0425  TRIG 112   Thyroid  Function Tests: No results for input(s): TSH, T4TOTAL, T3FREE, THYROIDAB in the last 72 hours.  Invalid input(s): FREET3 Anemia Panel: No results for input(s): VITAMINB12, FOLATE, FERRITIN, TIBC, IRON, RETICCTPCT in the last 72 hours.  DG Abd 1 View Result Date: 07/10/2024 EXAM: 1 VIEW XRAY OF THE ABDOMEN 07/10/2024 09:51:00 AM COMPARISON: 07/04/2024 CLINICAL HISTORY: Encounter for nasogastric tube placement. ICD10: 747668 Encounter for nasogastric tube placement. FINDINGS: LINES, TUBES AND DEVICES: Enteric tube in place with distal tip and side port terminating within the expected location of the gastric body. BOWEL: Nonobstructive bowel gas pattern. SOFT TISSUES: Vascular calcifications. BONES: No acute fracture.  PLEURAL SPACES: Left pleural effusion. LUNGS: Left basilar airspace opacity. IMPRESSION: 1. Enteric tube in place with distal tip and side port terminating within the expected location of the gastric body. Electronically signed by: Waddell Calk MD 07/10/2024 10:10 AM EST RP Workstation: [id number]      ECHO 06/23/2024: 1. Left ventricular ejection fraction, by estimation, is 25 to 30%. The left ventricle has severely decreased function. The left ventricle demonstrates global hypokinesis. The left  ventricular internal cavity size was severely dilated. Left ventricular diastolic function could not be evaluated.   2. Right ventricular systolic function is severely reduced. The right  ventricular size is severely enlarged. Mildly increased right ventricular  wall thickness.   3. Left atrial size was mildly dilated.   4. Right atrial size was severely dilated.   5. The mitral valve is normal in structure. Mild mitral valve regurgitation.   6. Tricuspid valve regurgitation is severe.   7. The aortic valve is grossly normal. Aortic valve regurgitation is not visualized. Aortic valve sclerosis is present, with no evidence of aortic  valve stenosis.   TELEMETRY (personally reviewed): Sinus rhythm rate 50s, PVCs  EKG (personally reviewed): AF RBBB rate 167 bpm  DATA reviewed by me 07/11/24: last 24h vitals tele labs imaging I/O, hospitalist progress note  Principal Problem:   Syncope and collapse Active Problems:   ESRD on hemodialysis (HCC)   Chronic hypotension   S/P CABG x 3   Mild dementia associated with other underlying disease, without behavioral disturbance, psychotic disturbance, mood disturbance, or anxiety (HCC)   Anemia of chronic disease   ESRD (end stage renal disease) (HCC)   Paroxysmal atrial fibrillation (HCC)   Hypothyroidism   Chronic combined systolic and diastolic CHF (congestive heart failure) (HCC)   Hepatitis C antibody positive in blood   Other cirrhosis of liver (HCC)   Hypertension   Bipolar disorder in full remission, most recent episode unspecified type   Chronic pain syndrome   Other secondary kyphosis, cervical region   Cervical spondylosis   Fall   Acute pulmonary edema (HCC)   Bacteremia due to Streptococcus   MRSA bacteremia   ESRD (end stage renal disease) on dialysis (HCC)   Acute on chronic respiratory failure with hypoxia (HCC)   Acute on chronic HFrEF (heart failure with reduced ejection fraction) (HCC)   Cardiac arrest with ventricular  fibrillation (HCC)    ASSESSMENT AND PLAN: [name] is a 64 y.o. female with a past medical history of chronic HFrEF, coronary artery disease, atrial fibrillation on eliquis , COPD, ESRD on HD, bipolar disorder, hypotension on midodrine , hx CVA  who presented to the ED on 07/01/2024 for syncopal event at home. Cardiology was consulted for further evaluation.   # Atrial fibrillation RVR # Paroxysmal atrial fibrillation # Syncope/ ?cardiac arrest # Chronic HFrEF # Coronary artery disease Patient brought to ED after being found down at home by family. Initially was admitted to the floor but on 07/02/24 she became unresponsive and was tachycardia, concern for wide complex tachycardia but has RBBB at baseline. Underwent ACLS, received 1 shock and 2 doses of epi with ROSC. Started on IV amiodarone .  Lidocaine  started 07/04/2024 by EP.  Overnight on 07/05/2024 developed bradycardia and thus IV amiodarone  was discontinued. 07/05/24 EP discontinued lidocaine  and restarted amiodarone .  -EP following, appreciate their assistance.  -Continue IV heparin .  -Continue PO amiodarone  loading. -Further management per primary team, appreciate their assistance. No plan for invasive cardiac procedures at this time. Discussed with  patient's daughter this AM that overall prognosis is poor, daughter expressed that family agrees with planning for medical management with no procedures.  -Agree with palliative care evaluation for continued goals of care conversations.  This patient's case was discussed and created with Dr. Wilburn and he is in agreement.  Signed:  Danita Bloch, PA-C  07/11/2024, 10:22 AM [facility] Cardiology

## 2024-07-11 NOTE — Progress Notes (Signed)
 "  NAME:  ARACELIS ULREY, MRN:  [id number], DOB:  02-Apr-1961, LOS: 9 ADMISSION DATE:  07/01/2024, CHIEF COMPLAINT:  Syncope, V-Fib Arrest  History of Present Illness:  TYNISHA OGAN is a 64 y.o. female with a pmh of of ESRD on HD (MWF), COPD, CHF, bipolar disorder, chronic hypotension on midodrine , HLD and prior CVA who presented to [facility] ED via EMS following syncopal episode.    History was gathered per chart review Per patient, her normal dialysis schedule is MWF.  Patient was recently admitted to the intensive care unit earlier this month July 03, 2024.  At that time she was treated for acute on chronic hypoxemia with circulatory shock since then she has been discharged home and while at home was found down by her family and was brought into the hospital.  After receiving medical management on the mat on the floor and dialysis patient had episode of pulselessness with wide complex tachyarrhythmia.  Rapid response was initiated and ACLS was started on patient.  She received 2 rounds of epinephrine  as well as shock with return of spontaneous circulation.  1 dose of IV amiodarone  was delivered and cardiology was contacted with initiation of amiodarone  infusion.  Patient was brought into the ICU on ventilator and intubated by EDP during CODE BLUE.  Pertinent  Medical History  07/02/24-transferred to ICU post CODE BLUE on medical floor status post ACLS with ROSC  07/03/24- overnight, unresposive with AF. MWF(HD). Will meet with family. Will continue to monitor and re-evaluate post dialysis.  07/04/24- patient had few episodes of VT. She is DNR code status.  She was seen by cardiology, she has poor prognosis. No procedures are planned.  07/05/24- patient had hypotensive episode during HD so it was interuppted.  The lidocaine  infusion will be dcd today and amiodarone  will continue.  Plan for paracentesis today. 07/06/24- patient with no events overnight, did have a non sustained run of VT similar to  previous.  Performing awakening trial today.  Due to staph/strep bacteremia the tunnelled vascCath has to come out.  07/07/24 - started PO amiodarone , MRI brain unremarkable 07/08/24 - more awake and interactive 07/09/24 - intubated and off sedation, failed WUA 07/10/24 - more awake, following commands, extubated 07/11/24 - HD catheter placed, triple lumen removed from left groin  Significant Hospital Events: Including procedures, antibiotic start and stop dates in addition to other pertinent events   Hypotension & Hypertension HLD HFrEF ESRD on HD Stroke CAD s/p CABG x 3 RBBB Pulmonary hypertension PVD Ischemic cardiomyopathy Hyperkalemia Hypothyroidism Atrial fibrillation on eliquis  Anemia of chronic kidney disease  Interim History / Subjective:  Awake and alert, following commands  Objective    Blood pressure (!) 88/60, pulse (!) 55, temperature 98.3 F (36.8 C), temperature source Axillary, resp. rate 16, height 5' 6 (1.676 m), weight 79.4 kg, SpO2 100%.        Intake/Output Summary (Last 24 hours) at 07/11/2024 1246 Last data filed at 07/11/2024 0700 Gross per 24 hour  Intake 473.21 ml  Output 600 ml  Net -126.79 ml   Filed Weights   07/09/24 0500 07/10/24 0409 07/11/24 0423  Weight: 75.4 kg 78.7 kg 79.4 kg    Examination: Physical Exam Constitutional:      General: She is not in acute distress.    Appearance: She is ill-appearing.  HENT:     Mouth/Throat:     Comments: ETT in place Cardiovascular:     Rate and Rhythm: Normal rate and regular rhythm.  Pulses: Normal pulses.     Heart sounds: Normal heart sounds.  Pulmonary:     Comments: Ventilated breath sounds bilaterally Neurological:     Mental Status: She is disoriented.     Comments: Awake today, following commands      Assessment and Plan   #Cardiac Arrest  #Ventricular Fibrillation #CAD  #Ischemic Cardiomyopathy #Septic Shock  #Gram Positive Bacteremia #COPD #Afib on Eliquis  #ESRD  on HD #HyperKalemia  Neurology - extubated yesterday, with some improvement in her mental status but remains encephalopathic and unable to provide meaningful history. MRI brain without acute pathology.   Cardiovascular - V. Fib arrest for which she was intubated and started on amiodarone  (and previously received lidocaine ). She has atrial fibrillation as well as HFrEF (LVEF 25% with global hypokinesis) and a dysfunctional and enlarged right ventricle. On PO amiodarone  metoprolol  . Coox with O2 sat of 81%. Will need GDMT, cardiology and EP following.   -Amiodarone  200 mg tid  -Metoprolol  12.5 mg bid  -goal MAP > 65 mmHg, continue to wean vasopressin   -increased midodrine  today  -continue IV heparin   Pulmonary - respiratory failure in the setting of cardiac arrest, now extubated as of 07/10/24 and currently on nasal cannula.  Gastrointestinal - s/p paracentesis (3.5 liters). Holding tube feeds with extubation and with finding of gastroparesis. PPI for SUP, discontinued. Will resume trickle feeds in the next 24 hours.  Renal - ESRD on HD through a tunneled dialysis catheter. With concern for bacteremia this was explanted. Placed new HD catheter in the right groin today, with plan for tunneled catheter placement later in the week with vascular surgery. Discussed with ID, Renal and Vascular.  Endocrine - ICU glycemic protocol.  Hem/Onc - heparin  gtt given Afib.  ID - gram positive bacteremia with staph gordonii as well as MRSA. Tunneled catheter explanted on 1/24. Repeat blood cultures remain negative. Continued on IV antibiotics. New temporary HD catheter placed today, 1/27. Will remove triple lumen catheter today.   -continue CTX and Vancomycin    Labs   CBC: Recent Labs  Lab 07/06/24 0407 07/07/24 0412 07/09/24 0425 07/10/24 0412 07/11/24 0425  WBC 15.5* 17.1* 14.2* 12.4* 13.9*  HGB 11.7* 11.5* 10.3* 10.2* 10.2*  HCT 33.7* 32.1* 29.0* 29.1* 30.4*  MCV 89.4 86.5 85.3 86.6 89.9  PLT  78* 84* 90* 100* 117*    Basic Metabolic Panel: Recent Labs  Lab 07/05/24 0439 07/06/24 0407 07/07/24 0412 07/08/24 0428 07/09/24 0425 07/10/24 0412 07/11/24 0425  NA 131* 130* 129* 130* 129* 126* 126*  K 3.7 4.0 4.1 3.7 4.0 4.1 4.8  CL 91* 90* 89* 89* 87* 86* 86*  CO2 25 26 26 26 24  21* 20*  GLUCOSE 79 96 108* 97 102* 106* 83  BUN 36* 29* 58* 48* 74* 94* 101*  CREATININE 3.97* 2.83* 3.63* 2.51* 3.45* 4.01* 4.86*  CALCIUM  10.2 10.0 10.9* 9.9 10.7* 10.7* 11.2*  MG 2.4 2.1 2.1 2.1  --   --  2.5*  PHOS 2.8 2.8 3.0 2.2* 3.1 3.6 5.2*   GFR: Estimated Creatinine Clearance: 12.9 mL/min (A) (by C-G formula based on SCr of 4.86 mg/dL (H)). Recent Labs  Lab 07/07/24 0412 07/09/24 0425 07/10/24 0412 07/11/24 0425  WBC 17.1* 14.2* 12.4* 13.9*    Liver Function Tests: Recent Labs  Lab 07/05/24 0439 07/06/24 0407 07/07/24 0412 07/09/24 0425 07/10/24 0412  ALBUMIN  2.9* 3.0* 2.8* 2.8* 2.9*   No results for input(s): LIPASE, AMYLASE in the last 168 hours. No results for input(s):  AMMONIA in the last 168 hours.   ABG    Component Value Date/Time   PHART 7.5 (H) 07/02/2024 2058   PCO2ART 33 07/02/2024 2058   PO2ART 202 (H) 07/02/2024 2058   HCO3 25.7 07/02/2024 2058   TCO2 26 04/20/2024 0708   ACIDBASEDEF 0.2 07/01/2024 2155   O2SAT 81.8 07/07/2024 2029     Coagulation Profile: Recent Labs  Lab 07/05/24 0820  INR 1.2    Cardiac Enzymes: No results for input(s): CKTOTAL, CKMB, CKMBINDEX, TROPONINI in the last 168 hours.  HbA1C: No results found for: HGBA1C  CBG: Recent Labs  Lab 07/10/24 1934 07/10/24 2324 07/11/24 0532 07/11/24 0803 07/11/24 1134  GLUCAP 90 71 89 84 86    Review of Systems:   N/A  Past Medical History:  She,  has a past medical history of Acute lacunar infarction (HCC) (03/03/2014), Adult behavior problems, Anemia of chronic renal failure, Aortic atherosclerosis, Arthritis, Atrial fibrillation and flutter (HCC),  Bipolar disorder (HCC), Cardiomegaly, Cerebral microvascular disease, CHF (congestive heart failure) (HCC), Chicken pox, Cholelithiasis, Chronic respiratory failure with hypoxia (HCC) (12/19/2021), COPD (chronic obstructive pulmonary disease) (HCC), Coronary artery disease, DDD (degenerative disc disease), cervical, DDD (degenerative disc disease), thoracolumbar, Diverticulosis, Dyspnea, ESRD (end stage renal disease) on dialysis (M,W,F), GERD (gastroesophageal reflux disease), Gout, HCV (hepatitis C virus), Headache, HFrEF (heart failure with reduced ejection fraction) (HCC), History of delirium (04/12/2014), History of substance abuse (HCC), Hyperkalemia (11/13/2014), Hyperlipidemia, Hypertension, Hypotension (02/24/2014), Hypothyroidism, Ischemic cardiomyopathy, Nose colonized with MRSA (02/01/2024), NSTEMI (non-ST elevated myocardial infarction) (HCC) (10/08/2013), NSTEMI (non-ST elevated myocardial infarction) (HCC) (01/23/2002), Obesity, 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, On apixaban  therapy, Peripheral vascular disease, Pneumonia, Postoperative anemia due to acute blood loss (02/27/2014), Postoperative cerebrovascular infarction following cardiac surgery (02/21/2014), Pulmonary hypertension (HCC), RBBB (right bundle branch block), Renal osteodystrophy, S/P CABG x 3 (02/20/2014), Stroke (HCC) (05/23/2010), and Syncope and collapse (07/25/2020).   Surgical History:   Past Surgical History:  Procedure Laterality Date   A/V FISTULAGRAM Left 03/30/2019   Procedure: A/V FISTULAGRAM;  Surgeon: Marea Selinda RAMAN, MD;  Location: [facility];  Service: Cardiovascular;  Laterality: Left;   A/V FISTULAGRAM Left 10/06/2019   Procedure: A/V FISTULAGRAM;  Surgeon: Marea Selinda RAMAN, MD;  Location: [facility];  Service: Cardiovascular;  Laterality: Left;   A/V FISTULAGRAM Left 05/19/2021   Procedure: A/V FISTULAGRAM;  Surgeon: Marea Selinda RAMAN, MD;  Location: [facility];  Service:  Cardiovascular;  Laterality: Left;   A/V FISTULAGRAM Left 01/29/2022   Procedure: A/V Fistulagram;  Surgeon: Marea Selinda RAMAN, MD;  Location: [facility];  Service: Cardiovascular;  Laterality: Left;   A/V FISTULAGRAM Left 04/30/2022   Procedure: A/V Fistulagram;  Surgeon: Marea Selinda RAMAN, MD;  Location: [facility];  Service: Cardiovascular;  Laterality: Left;   A/V FISTULAGRAM Left 08/20/2022   Procedure: A/V Fistulagram;  Surgeon: Marea Selinda RAMAN, MD;  Location: [facility];  Service: Cardiovascular;  Laterality: Left;   A/V FISTULAGRAM N/A 12/03/2022   Procedure: A/V Fistulagram;  Surgeon: Marea Selinda RAMAN, MD;  Location: [facility];  Service: Cardiovascular;  Laterality: N/A;   A/V FISTULAGRAM Left 02/11/2023   Procedure: A/V Fistulagram;  Surgeon: Marea Selinda RAMAN, MD;  Location: [facility];  Service: Cardiovascular;  Laterality: Left;   A/V FISTULAGRAM Left 02/07/2024   Procedure: A/V Fistulagram;  Surgeon: Marea Selinda RAMAN, MD;  Location: [facility];  Service: Cardiovascular;  Laterality: Left;  COLONOSCOPY     COLONOSCOPY WITH PROPOFOL  N/A 04/22/2021   Procedure: COLONOSCOPY WITH PROPOFOL ;  Surgeon: Maryruth Ole DASEN, MD;  Location: [facility];  Service: Endoscopy;  Laterality: N/A;   CORONARY ANGIOPLASTY WITH STENT PLACEMENT Left 01/25/2002   Procedure: CORONARY ANGIOPLASTY WITH STENT PLACEMENT; Location: [facility]; Surgeon: Margie Lovelace, MD   CORONARY ARTERY BYPASS GRAFT N/A 02/20/2014   Procedure: CORONARY ARTERY BYPASS GRAFT; Location: [facility]; Surgeon: Reyes Fruits, MD   DIALYSIS FISTULA CREATION     DIALYSIS/PERMA CATHETER INSERTION N/A 04/08/2023   Procedure: DIALYSIS/PERMA CATHETER INSERTION;  Surgeon: Marea Selinda RAMAN, MD;  Location: [facility];  Service: Cardiovascular;  Laterality: N/A;   DIALYSIS/PERMA CATHETER REMOVAL N/A 07/06/2023   Procedure: DIALYSIS/PERMA CATHETER REMOVAL;  Surgeon: [name] Cordella MATSU, MD;  Location: [facility];  Service: Cardiovascular;  Laterality: N/A;   ESOPHAGOGASTRODUODENOSCOPY     ESOPHAGOGASTRODUODENOSCOPY N/A 04/22/2021   Procedure: ESOPHAGOGASTRODUODENOSCOPY (EGD);  Surgeon: Maryruth Ole DASEN, MD;  Location: [facility] ENDOSCOPY;  Service: Endoscopy;  Laterality: N/A;   FLEXIBLE SIGMOIDOSCOPY N/A 02/23/2019   Procedure: FLEXIBLE SIGMOIDOSCOPY;  Surgeon: Gaylyn Gladis PENNER, MD;  Location: [facility] ENDOSCOPY;  Service: Endoscopy;  Laterality: N/A;   IABP INSERTION  02/20/2014   Procedure: INTRA-AORTIC BALLOON PUMP INSERTION; Location: [facility]; Surgeon: Reyes Fruits, MD   INSERTION OF ARTERIOVENOUS (AV) ARTEGRAFT ARM Right 03/09/2024   Procedure: INSERTION, GRAFT, ARTERIOVENOUS, UPPER EXTREMITY;  Surgeon: Marea Selinda RAMAN, MD;  Location: [facility];  Service: Vascular;  Laterality: Right;  BRACHIAL AXILLARY   INSERTION OF DIALYSIS CATHETER N/A 04/20/2024   Procedure: INSERTION OF DIALYSIS CATHETER;  Surgeon: Marea Selinda RAMAN, MD;  Location: [facility];  Service: Vascular;  Laterality: N/A;   LEFT HEART CATH AND CORONARY ANGIOGRAPHY Left 01/24/2002   Procedure: LEFT HEART CATH AND CORONARY ANGIOGRAPHY; Location: [facility]; Surgeon: Margie Lovelace, MD   LEFT HEART CATH AND CORONARY ANGIOGRAPHY Left 05/25/2012   Procedure: LEFT HEART CATH AND CORONARY ANGIOGRAPHY; Location: [facility]; Surgeon: Margie Lovelace, MD   LEFT HEART CATH AND CORONARY ANGIOGRAPHY Left 10/09/2013   Procedure: LEFT HEART CATH AND CORONARY ANGIOGRAPHY; Location: [facility]; Surgeon: Vinie Jude, MD   LEFT HEART CATH AND CORS/GRAFTS ANGIOGRAPHY N/A 08/08/2018   Procedure: LEFT HEART CATH AND CORS/GRAFTS ANGIOGRAPHY;  Surgeon: Fernand Denyse LABOR, MD;  Location: [facility];  Service: Cardiovascular;  Laterality: N/A;   LEFT HEART CATH AND CORS/GRAFTS ANGIOGRAPHY N/A 01/30/2019   Procedure: LEFT HEART CATH AND CORS/GRAFTS ANGIOGRAPHY;  Surgeon: Fernand Denyse LABOR, MD;  Location: [facility];  Service: Cardiovascular;  Laterality: N/A;    LIGATION ARTERIOVENOUS GORTEX GRAFT Right 04/20/2024   Procedure: LIGATION ARTERIOVENOUS GORTEX GRAFT;  Surgeon: Marea Selinda RAMAN, MD;  Location: [facility];  Service: Vascular;  Laterality: Right;   LOWER EXTREMITY ANGIOGRAPHY Left 04/24/2024   Procedure: Lower Extremity Angiography;  Surgeon: Marea Selinda RAMAN, MD;  Location: [facility];  Service: Cardiovascular;  Laterality: Left;   ultrasound guided pericardiocentesis     UPPER EXTREMITY ANGIOGRAPHY Right 03/30/2024   Procedure: Upper Extremity Angiography;  Surgeon: Marea Selinda RAMAN, MD;  Location: [facility];  Service: Cardiovascular;  Laterality: Right;     Social History:   reports that she has been smoking cigarettes. She has a 5 pack-year smoking history. She has never used smokeless tobacco. She reports that she does not drink alcohol and does not use drugs.   Family History:  Her family history includes Breast cancer in her maternal aunt and maternal grandmother;  Cancer in an other family member; Diabetes type II in her maternal grandmother and mother; Hypertension in her father, mother, sister, and another family member; Kidney disease in her father; Ovarian cancer in her mother; Renal Disease in an other family member.   Allergies Allergies[1]   Home Medications  Prior to Admission medications  Medication Sig Start Date End Date Taking? Authorizing Provider  albuterol  (VENTOLIN  HFA) 108 (90 Base) MCG/ACT inhaler Inhale 1-2 puffs into the lungs every 6 (six) hours as needed for wheezing or shortness of breath. 01/04/24  Yes Orlean Alan HERO, FNP  apixaban  (ELIQUIS ) 5 MG TABS tablet Take 5 mg by mouth 2 (two) times daily. 09/11/19  Yes [provider]  clobetasol cream (TEMOVATE) 0.05 % Apply 1 Application topically daily. 06/16/24  Yes [provider]  hydrOXYzine  (ATARAX /VISTARIL ) 50 MG tablet Take 50 mg by mouth 2 (two) times daily.   Yes [provider]  levothyroxine  (SYNTHROID ) 75 MCG tablet Take  75 mcg by mouth daily before breakfast.  11/02/18  Yes [provider]  lidocaine -prilocaine  (EMLA ) cream Apply 1 Application topically once. 01/22/21  Yes [provider]  [Paused] metoprolol  succinate (TOPROL -XL) 25 MG 24 hr tablet Take 1 tablet by mouth every morning. Wait to take this until your doctor or other care provider tells you to start again. 08/27/23 08/26/24 Yes [provider]  midodrine  (PROAMATINE ) 10 MG tablet Take 1 tablet (10 mg total) by mouth 3 (three) times daily. 06/27/24  Yes Ponnala, Shruthi, MD  oxyCODONE -acetaminophen  (PERCOCET) 5-325 MG tablet Take 1 tablet by mouth every 6 (six) hours as needed for severe pain (pain score 7-10). 06/27/24  Yes Ponnala, Shruthi, MD  pantoprazole  (PROTONIX ) 40 MG tablet Take 1 tablet (40 mg total) by mouth every morning. 06/27/24  Yes Ponnala, Shruthi, MD  pregabalin (LYRICA) 50 MG capsule Take 50 mg by mouth daily. Patient not taking: Reported on 06/23/2024    [provider]  ranolazine  (RANEXA ) 1000 MG SR tablet Take 1,000 mg by mouth 2 (two) times daily. Patient not taking: Reported on 06/23/2024 03/18/23   [provider]  rosuvastatin  (CRESTOR ) 20 MG tablet Take 20 mg by mouth at bedtime. Patient not taking: Reported on 06/23/2024 11/07/21   [provider]  sevelamer  carbonate (RENVELA ) 800 MG tablet Take 3,200 mg by mouth 3 (three) times daily with meals. Patient not taking: Reported on 06/23/2024    [provider]  venlafaxine  (EFFEXOR ) 37.5 MG tablet Take 37.5 mg by mouth 2 (two) times daily with a meal. Patient not taking: Reported on 06/23/2024    [provider]     The patient is critically ill due to ESRD on HD, acute hypoxic respiratory failure, septic shock.  Critical care was necessary to treat or prevent imminent or life-threatening deterioration. I personally performed high risk medication and infusion titration and management and titration, monitoring, and  management of vasopressor/ionotrope infusion. Critical care time was spent by me on the following activities: development of a treatment plan with the patient and/or surrogate as well as nursing, discussions with consultants, evaluation of the patient's response to treatment, examination of the patient, obtaining a history from the patient or surrogate, ordering and performing treatments and interventions, ordering and review of laboratory studies, ordering and review of radiographic studies, review of telemetry data including pulse oximetry, re-evaluation of patient's condition and participation in multidisciplinary rounds.   I personally spent 33 minutes providing critical care not including any separately billable procedures.   [employer]  Isadora, MD [facility] 07/11/2024 12:49 PM               [1]  Allergies Allergen Reactions   Morphine  And Codeine Hives   Levaquin [Levofloxacin] Itching and Other (See Comments)    Severe itching; prickly sensation; severe left leg pain; immobility.   Enalapril  Other (See Comments)    hallucinations   Latex Rash   Tape Rash and Other (See Comments)   "

## 2024-07-12 DIAGNOSIS — J9690 Respiratory failure, unspecified, unspecified whether with hypoxia or hypercapnia: Secondary | ICD-10-CM

## 2024-07-12 DIAGNOSIS — I4901 Ventricular fibrillation: Secondary | ICD-10-CM | POA: Diagnosis not present

## 2024-07-12 DIAGNOSIS — L8990 Pressure ulcer of unspecified site, unspecified stage: Secondary | ICD-10-CM | POA: Insufficient documentation

## 2024-07-12 DIAGNOSIS — G928 Other toxic encephalopathy: Secondary | ICD-10-CM

## 2024-07-12 DIAGNOSIS — R55 Syncope and collapse: Secondary | ICD-10-CM | POA: Diagnosis not present

## 2024-07-12 DIAGNOSIS — N186 End stage renal disease: Secondary | ICD-10-CM | POA: Diagnosis not present

## 2024-07-12 DIAGNOSIS — Z515 Encounter for palliative care: Secondary | ICD-10-CM | POA: Diagnosis not present

## 2024-07-12 LAB — BASIC METABOLIC PANEL WITH GFR
Anion gap: 16 — ABNORMAL HIGH (ref 5–15)
BUN: 51 mg/dL — ABNORMAL HIGH (ref 8–23)
CO2: 25 mmol/L (ref 22–32)
Calcium: 10 mg/dL (ref 8.9–10.3)
Chloride: 89 mmol/L — ABNORMAL LOW (ref 98–111)
Creatinine, Ser: 3.22 mg/dL — ABNORMAL HIGH (ref 0.44–1.00)
GFR, Estimated: 16 mL/min — ABNORMAL LOW
Glucose, Bld: 80 mg/dL (ref 70–99)
Potassium: 3.8 mmol/L (ref 3.5–5.1)
Sodium: 130 mmol/L — ABNORMAL LOW (ref 135–145)

## 2024-07-12 LAB — GLUCOSE, CAPILLARY
Glucose-Capillary: 122 mg/dL — ABNORMAL HIGH (ref 70–99)
Glucose-Capillary: 69 mg/dL — ABNORMAL LOW (ref 70–99)
Glucose-Capillary: 70 mg/dL (ref 70–99)
Glucose-Capillary: 75 mg/dL (ref 70–99)
Glucose-Capillary: 77 mg/dL (ref 70–99)
Glucose-Capillary: 81 mg/dL (ref 70–99)
Glucose-Capillary: 89 mg/dL (ref 70–99)
Glucose-Capillary: 91 mg/dL (ref 70–99)

## 2024-07-12 LAB — CBC
HCT: 30.1 % — ABNORMAL LOW (ref 36.0–46.0)
Hemoglobin: 9.9 g/dL — ABNORMAL LOW (ref 12.0–15.0)
MCH: 30.1 pg (ref 26.0–34.0)
MCHC: 32.9 g/dL (ref 30.0–36.0)
MCV: 91.5 fL (ref 80.0–100.0)
Platelets: 134 10*3/uL — ABNORMAL LOW (ref 150–400)
RBC: 3.29 MIL/uL — ABNORMAL LOW (ref 3.87–5.11)
RDW: 21.7 % — ABNORMAL HIGH (ref 11.5–15.5)
WBC: 12.7 10*3/uL — ABNORMAL HIGH (ref 4.0–10.5)
nRBC: 0.2 % (ref 0.0–0.2)

## 2024-07-12 LAB — HEPARIN LEVEL (UNFRACTIONATED): Heparin Unfractionated: 0.48 [IU]/mL (ref 0.30–0.70)

## 2024-07-12 LAB — CULTURE, BLOOD (ROUTINE X 2)

## 2024-07-12 LAB — PHOSPHORUS: Phosphorus: 4 mg/dL (ref 2.5–4.6)

## 2024-07-12 LAB — MAGNESIUM: Magnesium: 2.2 mg/dL (ref 1.7–2.4)

## 2024-07-12 MED ORDER — SODIUM THIOSULFATE 250 MG/ML IV SOLN
25.0000 g | INTRAVENOUS | Status: DC
  Administered 2024-07-13: 25 g via INTRAVENOUS
  Filled 2024-07-12 (×3): qty 100

## 2024-07-12 MED ORDER — OSMOLITE 1.5 CAL PO LIQD
1000.0000 mL | ORAL | Status: DC
  Administered 2024-07-12: 1000 mL

## 2024-07-12 MED ORDER — DEXTROSE 50 % IV SOLN
INTRAVENOUS | Status: AC
  Filled 2024-07-12: qty 50

## 2024-07-12 MED ORDER — DEXTROSE 50 % IV SOLN
12.5000 g | INTRAVENOUS | Status: AC
  Administered 2024-07-12: 12.5 g via INTRAVENOUS

## 2024-07-12 NOTE — Evaluation (Signed)
 Occupational Therapy Evaluation Patient Details Name: [name] MRN: [id number] DOB: Jun 25, 1960 Today's Date: 07/12/2024   History of Present Illness   64 y/o female presented to ED on 07/01/24 for weakness and fall. Recent admit 1/8-1/14 for fluid overload and cardiogenic shock with syncope and collapse. Cardiac arrest on 1/18. Intubated 1/18-1/26.  PMH: COPD, ESRD on HD, CHF, bipolar disorder, chronic hypotension on midodrine , prior CVA, PVD, CAD s/p CABG x3, Afib on Eliquis      Clinical Impressions Chart reviewd to date, pt greeted semi supine in bed. She is alert but requires increased time for processing/one step direction following. Pt is a poor historian at this time, per chart she lives alone and performs ADL with MOD I, has assist for IADL. Per chart, approx 2 weeks ago pt amb 125' with RW with PT. Pt presents with deficits in strength, endurance, activity tolerance, balance, cognition, affecting safe and optimal ADL completion. TOTAL A +2 for bed mobility, MAX-TOTAL A for ADLs at this time. Pt does present with mildly improved engagement with position changes. Cervical neck positioning in R toritcollis with limited tolerance to reposition toward midline in bed and on edge of bed. Pt progresses to sitting with CGA on edge of bed. See further details below. Pt is performing ADL/functional mobility below PLOF, will benefit from acute OT to address functional deficits/facilitate optimal ADL/functional mobility engagement.   VSS on 3L via [place] throughout, noted covered wounds on L foot; recommended prevlon for skin protection/decrease risk of breakdown. Pt positioned in semi supine with pillow on R side of neck with wedge to further assist with postioning. Pt is not at midline but improved as compared to start of evaluation.     If plan is discharge home, recommend the following:   Two people to help with walking and/or transfers;Two people to help with  bathing/dressing/bathroom;Supervision due to cognitive status     Functional Status Assessment   Patient has had a recent decline in their functional status and demonstrates the ability to make significant improvements in function in a reasonable and predictable amount of time.     Equipment Recommendations   Other (comment) (defer to next venue of care)     Recommendations for Other Services         Precautions/Restrictions   Precautions Precautions: Fall Recall of Precautions/Restrictions: Impaired Restrictions Weight Bearing Restrictions Per Provider Order: No     Mobility Bed Mobility Overal bed mobility: Needs Assistance Bed Mobility: Supine to Sit, Sit to Supine     Supine to sit: Total assist, +2 for physical assistance, +2 for safety/equipment Sit to supine: Total assist, +2 for physical assistance, +2 for safety/equipment        Transfers                          Balance Overall balance assessment: Needs assistance Sitting-balance support: Feet unsupported, Single extremity supported Sitting balance-Leahy Scale: Poor Sitting balance - Comments: initially MOD-MAX A, progressing to CGA                                   ADL either performed or assessed with clinical judgement   ADL Overall ADL's : Needs assistance/impaired     Grooming: Maximal assistance;Sitting               Lower Body Dressing: Total assistance;Bed level  Vision Patient Visual Report: No change from baseline Additional Comments: will continue to assess     Perception         Praxis         Pertinent Vitals/Pain Pain Assessment Pain Assessment: CPOT Facial Expression: Tense Body Movements: Protection Muscle Tension: Tense, rigid Compliance with ventilator (intubated pts.): N/A Vocalization (extubated pts.): Sighing, moaning CPOT Total: 4 Pain Location: neck Pain Descriptors / Indicators: Discomfort,  Sore Pain Intervention(s): Repositioned, Monitored during session     Extremity/Trunk Assessment Upper Extremity Assessment Upper Extremity Assessment: Generalized weakness   Lower Extremity Assessment Lower Extremity Assessment: Defer to PT evaluation LLE Deficits / Details: L foot pain due to wounds   Cervical / Trunk Assessment Cervical / Trunk Assessment: Other exceptions Cervical / Trunk Exceptions: Pt with lateral head tilt to the R with chin towards shoulder- R torticollis; unable to tolerate sustained stretch to midline   Communication Communication Communication: Impaired Factors Affecting Communication: Difficulty expressing self;Reduced clarity of speech   Cognition Arousal: Alert Behavior During Therapy: Flat affect Cognition: Cognition impaired   Orientation impairments: Time, Situation Awareness: Intellectual awareness impaired, Online awareness impaired Memory impairment (select all impairments): Declarative long-term memory Attention impairment (select first level of impairment): Focused attention Executive functioning impairment (select all impairments): Sequencing, Reasoning, Problem solving                   Following commands: Impaired Following commands impaired: Follows one step commands with increased time     Cueing  General Comments   Cueing Techniques: Verbal cues;Tactile cues;Visual cues  vss on 3L via [place]   Exercises Other Exercises Other Exercises: edu re role of OT, role of rehab, discharge recommendations   Shoulder Instructions      Home Living Family/patient expects to be discharged to:: Private residence Living Arrangements: Alone Available Help at Discharge: Family;Available PRN/intermittently Type of Home: House Home Access: Stairs to enter;Ramped entrance Entrance Stairs-Number of Steps: 2 Entrance Stairs-Rails: Right;Left;Can reach both Home Layout: One level     Bathroom Shower/Tub: Sponge bathes at baseline          Home Equipment: Agricultural Consultant (2 wheels);Rollator (4 wheels);Cane - quad;Wheelchair - manual   Additional Comments: info is from chart review, will need to confirm      Prior Functioning/Environment Prior Level of Function : Independent/Modified Independent;Needs assist;Patient poor historian/Family not available             Mobility Comments: amb with SPC/RW/ transfer to mwc for mobility depending on how she feels per chart- will need to confirm ADLs Comments: per chart, pt MOD I for ADLs, medical transport for HD, family assist with IADL, will need to confirm    OT Problem List: Impaired balance (sitting and/or standing);Decreased strength;Decreased safety awareness;Decreased activity tolerance   OT Treatment/Interventions: Self-care/ADL training;Therapeutic exercise;Patient/family education;Balance training;Therapeutic activities      OT Goals(Current goals can be found in the care plan section)   Acute Rehab OT Goals Patient Stated Goal: lie down OT Goal Formulation: With patient Time For Goal Achievement: 07/26/24 Potential to Achieve Goals: Fair ADL Goals Pt Will Perform Grooming: with mod assist;sitting Pt Will Perform Lower Body Dressing: with mod assist;sit to/from stand;sitting/lateral leans Pt Will Transfer to Toilet: with mod assist Pt Will Perform Toileting - Clothing Manipulation and hygiene: with mod assist;sitting/lateral leans;sit to/from stand   OT Frequency:  Min 2X/week    Co-evaluation PT/OT/SLP Co-Evaluation/Treatment: Yes Reason for Co-Treatment: For patient/therapist safety;To address functional/ADL transfers PT goals  addressed during session: Mobility/safety with mobility;Balance OT goals addressed during session: ADL's and self-care      AM-PAC OT 6 Clicks Daily Activity     Outcome Measure Help from another person eating meals?: Total Help from another person taking care of personal grooming?: Total Help from another person  toileting, which includes using toliet, bedpan, or urinal?: Total Help from another person bathing (including washing, rinsing, drying)?: Total Help from another person to put on and taking off regular upper body clothing?: Total Help from another person to put on and taking off regular lower body clothing?: Total 6 Click Score: 6   End of Session Equipment Utilized During Treatment: Oxygen  Nurse Communication: Mobility status  Activity Tolerance: Patient limited by pain Patient left: in bed;with call bell/phone within reach;with bed alarm set  OT Visit Diagnosis: Unsteadiness on feet (R26.81);Repeated falls (R29.6);Muscle weakness (generalized) (M62.81)                Time: 8652-8584 OT Time Calculation (min): 28 min Charges:  OT General Charges $OT Visit: 1 Visit OT Evaluation $OT Eval High Complexity: 1 High  Therisa Sheffield, OTD OTR/L  07/12/24, 4:16 PM

## 2024-07-12 NOTE — Progress Notes (Signed)
 " [facility]  PROGRESS NOTE   Subjective:   Patient continues to breathe comfortably status post extubation. She is arousable this a.m. Underwent dialysis treatment yesterday. UF achieved was 1.8 kg.  Objective:  Vital signs: Blood pressure (!) 104/57, pulse (!) 58, temperature (!) 97 F (36.1 C), temperature source Axillary, resp. rate 19, height 5' 6 (1.676 m), weight 75.9 kg, SpO2 97%.  Intake/Output Summary (Last 24 hours) at 07/12/2024 0800 Last data filed at 07/12/2024 0700 Gross per 24 hour  Intake 626.64 ml  Output 1800 ml  Net -1173.36 ml   Filed Weights   07/11/24 1508 07/11/24 1848 07/12/24 0500  Weight: 74.1 kg 72.3 kg 75.9 kg     Physical Exam: General:  No acute distress  Head:  Normocephalic, atraumatic.    Eyes:  Anicteric  Lungs:   Clear to auscultation, normal effort  Heart:  S1S2 no rubs  Abdomen:   Soft, distended  Extremities:  + peripheral edema.  Neurologic:  Awake but confused  Skin:  Left foot wound  Access: Right femoral dialysis catheter    Basic Metabolic Panel: Recent Labs  Lab 07/06/24 0407 07/07/24 0412 07/08/24 0428 07/09/24 0425 07/10/24 0412 07/11/24 0425 07/12/24 0411  NA 130* 129* 130* 129* 126* 126* 130*  K 4.0 4.1 3.7 4.0 4.1 4.8 3.8  CL 90* 89* 89* 87* 86* 86* 89*  CO2 26 26 26 24  21* 20* 25  GLUCOSE 96 108* 97 102* 106* 83 80  BUN 29* 58* 48* 74* 94* 101* 51*  CREATININE 2.83* 3.63* 2.51* 3.45* 4.01* 4.86* 3.22*  CALCIUM  10.0 10.9* 9.9 10.7* 10.7* 11.2* 10.0  MG 2.1 2.1 2.1  --   --  2.5* 2.2  PHOS 2.8 3.0 2.2* 3.1 3.6 5.2* 4.0   GFR: Estimated Creatinine Clearance: 19.1 mL/min (A) (by C-G formula based on SCr of 3.22 mg/dL (H)).  Liver Function Tests: Recent Labs  Lab 07/06/24 0407 07/07/24 0412 07/09/24 0425 07/10/24 0412  ALBUMIN  3.0* 2.8* 2.8* 2.9*   No results for input(s): LIPASE, AMYLASE in the last 168 hours. No results for input(s): AMMONIA in the last 168  hours.   CBC: Recent Labs  Lab 07/07/24 0412 07/09/24 0425 07/10/24 0412 07/11/24 0425 07/12/24 0411  WBC 17.1* 14.2* 12.4* 13.9* 12.7*  HGB 11.5* 10.3* 10.2* 10.2* 9.9*  HCT 32.1* 29.0* 29.1* 30.4* 30.1*  MCV 86.5 85.3 86.6 89.9 91.5  PLT 84* 90* 100* 117* 134*     HbA1C: No results found for: HGBA1C  Urinalysis: No results for input(s): COLORURINE, LABSPEC, PHURINE, GLUCOSEU, HGBUR, BILIRUBINUR, KETONESUR, PROTEINUR, UROBILINOGEN, NITRITE, LEUKOCYTESUR in the last 72 hours.  Invalid input(s): APPERANCEUR    Imaging: ECHOCARDIOGRAM COMPLETE Result Date: 07/11/2024    ECHOCARDIOGRAM REPORT   Patient Name:   PADDY WALTHALL [facility] Date of Exam: 07/11/2024 Medical Rec #:  [id number]         Height:       66.0 in Accession #:    [id number]        Weight:       175.0 lb Date of Birth:  01-06-1961          BSA:          1.890 m Patient Age:    64 years          BP:           107/74 mmHg Patient Gender: F  HR:           61 bpm. Exam Location:  [facility] Procedure: 2D Echo, Cardiac Doppler and Color Doppler (Both Spectral and Color            Flow Doppler were utilized during procedure). Indications:     Bacteremia  History:         Patient has prior history of Echocardiogram examinations. Prior                  CABG; Signs/Symptoms:Chest Pain and Bacteremia.  Sonographer:     Rainelle Gull Referring Phys:  [id number] DONALD BERLIN Diagnosing Phys: Lonni Hanson MD  Sonographer Comments: Suboptimal apical window. IMPRESSIONS  1. Left ventricular ejection fraction, by estimation, is 25 to 30%. The left ventricle has severely decreased function. The left ventricle demonstrates regional wall motion abnormalities (see scoring diagram/findings for description). The left ventricular internal cavity size was severely dilated. Left ventricular diastolic parameters are indeterminate. There is moderate hypokinesis of the left ventricular, entire septal wall and anterior  wall. There is akinesis of the left ventricular, entire  inferior wall and inferolateral wall.  2. Right ventricular systolic function is severely reduced. The right ventricular size is moderately enlarged. There is severely elevated pulmonary artery systolic pressure. The estimated right ventricular systolic pressure is 94.6 mmHg.  3. Left atrial size was severely dilated.  4. Right atrial size was severely dilated.  5. The mitral valve is normal in structure. Mild mitral valve regurgitation. No evidence of mitral stenosis.  6. Tricuspid valve regurgitation is severe.  7. The aortic valve is tricuspid. There is mild thickening of the aortic valve. Aortic valve regurgitation is not visualized. Mild to moderate aortic valve stenosis.  8. The inferior vena cava is dilated in size with <50% respiratory variability, suggesting right atrial pressure of 15 mmHg. Conclusion(s)/Recommendation(s): No evidence of valvular vegetations on this transthoracic echocardiogram. Consider a transesophageal echocardiogram to exclude infective endocarditis if clinically indicated. FINDINGS  Left Ventricle: Left ventricular ejection fraction, by estimation, is 25 to 30%. The left ventricle has severely decreased function. The left ventricle demonstrates regional wall motion abnormalities. Moderate hypokinesis of the left ventricular, entire  septal wall and anterior wall. The left ventricular internal cavity size was severely dilated. There is no left ventricular hypertrophy. Left ventricular diastolic parameters are indeterminate. Right Ventricle: The right ventricular size is moderately enlarged. No increase in right ventricular wall thickness. Right ventricular systolic function is severely reduced. There is severely elevated pulmonary artery systolic pressure. The tricuspid regurgitant velocity is 4.46 m/s, and with an assumed right atrial pressure of 15 mmHg, the estimated right ventricular systolic pressure is 94.6 mmHg. Left  Atrium: Left atrial size was severely dilated. Right Atrium: Right atrial size was severely dilated. Pericardium: There is no evidence of pericardial effusion. Mitral Valve: The mitral valve is normal in structure. Mild mitral valve regurgitation. No evidence of mitral valve stenosis. Tricuspid Valve: The tricuspid valve is normal in structure. Tricuspid valve regurgitation is severe. Aortic Valve: The aortic valve is tricuspid. There is mild thickening of the aortic valve. Aortic valve regurgitation is not visualized. Mild to moderate aortic stenosis is present. Aortic valve mean gradient measures 6.0 mmHg. Aortic valve peak gradient  measures 12.0 mmHg. Aortic valve area, by VTI measures 1.56 cm. Pulmonic Valve: The pulmonic valve was normal in structure. Pulmonic valve regurgitation is trivial. No evidence of pulmonic stenosis. Aorta: The aortic root is normal in size and structure. Pulmonary Artery: The pulmonary artery  is of normal size. Venous: The inferior vena cava is dilated in size with less than 50% respiratory variability, suggesting right atrial pressure of 15 mmHg. IAS/Shunts: The interatrial septum was not well visualized.  LEFT VENTRICLE PLAX 2D LVIDd:         7.30 cm LVIDs:         6.40 cm LV PW:         0.90 cm LV IVS:        0.90 cm LVOT diam:     2.10 cm LV SV:         54 LV SV Index:   29 LVOT Area:     3.46 cm  LV Volumes (MOD) LV vol d, MOD A2C: 242.0 ml LV vol d, MOD A4C: 186.0 ml LV vol s, MOD A2C: 176.0 ml LV vol s, MOD A4C: 126.0 ml LV SV MOD A2C:     66.0 ml LV SV MOD A4C:     186.0 ml LV SV MOD BP:      68.2 ml RIGHT VENTRICLE            IVC RV Basal diam:  6.00 cm    IVC diam: 2.30 cm RV Mid diam:    4.80 cm RV S prime:     6.20 cm/s TAPSE (M-mode): 1.3 cm LEFT ATRIUM              Index        RIGHT ATRIUM           Index LA Vol (A2C):   126.0 ml 66.68 ml/m  RA Area:     30.50 cm LA Vol (A4C):   114.0 ml 60.33 ml/m  RA Volume:   107.00 ml 56.62 ml/m LA Biplane Vol: 120.0 ml 63.50  ml/m  AORTIC VALVE                     PULMONIC VALVE AV Area (Vmax):    1.67 cm      PV Vmax:          0.91 m/s AV Area (Vmean):   1.60 cm      PV Vmean:         61.200 cm/s AV Area (VTI):     1.56 cm      PV VTI:           0.226 m AV Vmax:           173.00 cm/s   PV Peak grad:     3.3 mmHg AV Vmean:          111.000 cm/s  PV Mean grad:     2.0 mmHg AV VTI:            0.346 m       PR End Diast Vel: 5.64 msec AV Peak Grad:      12.0 mmHg AV Mean Grad:      6.0 mmHg LVOT Vmax:         83.60 cm/s LVOT Vmean:        51.200 cm/s LVOT VTI:          0.156 m LVOT/AV VTI ratio: 0.45  AORTA Ao Root diam: 2.80 cm Ao Asc diam:  2.40 cm TRICUSPID VALVE TR Peak grad:   79.6 mmHg TR Vmax:        446.00 cm/s  SHUNTS Systemic VTI:  0.16 m Systemic Diam: 2.10 cm Lonni Hanson MD Electronically signed by Lonni Hanson MD Signature  Date/Time: 07/11/2024/5:30:36 PM    Final    DG Abd 1 View Result Date: 07/10/2024 EXAM: 1 VIEW XRAY OF THE ABDOMEN 07/10/2024 09:51:00 AM COMPARISON: 07/04/2024 CLINICAL HISTORY: Encounter for nasogastric tube placement. ICD10: 747668 Encounter for nasogastric tube placement. FINDINGS: LINES, TUBES AND DEVICES: Enteric tube in place with distal tip and side port terminating within the expected location of the gastric body. BOWEL: Nonobstructive bowel gas pattern. SOFT TISSUES: Vascular calcifications. BONES: No acute fracture. PLEURAL SPACES: Left pleural effusion. LUNGS: Left basilar airspace opacity. IMPRESSION: 1. Enteric tube in place with distal tip and side port terminating within the expected location of the gastric body. Electronically signed by: Waddell Calk MD 07/10/2024 10:10 AM EST RP Workstation: [id number]       Medications:    heparin  1,450 Units/hr (07/12/24 0700)   vasopressin  Stopped (07/11/24 1918)    amiodarone   200 mg Per Tube TID   Chlorhexidine  Gluconate Cloth  6 each Topical Daily   collagenase   1 Application Topical Daily   free water   30 mL Per Tube Q4H    levothyroxine   75 mcg Per Tube Q0600   metoprolol  tartrate  12.5 mg Per Tube BID   midodrine   15 mg Per Tube TID WC   multivitamin  1 tablet Per Tube QHS   nutrition supplement (JUVEN)  1 packet Per Tube BID BM   sodium chloride  flush  10-40 mL Intracatheter Q12H   vancomycin  variable dose per unstable renal function (pharmacist dosing)   Does not apply See admin instructions    Assessment/ Plan:     64 year old female with history of coronary disease history of CABG, ESRD, COPD, atrial fibrillation on hemodialysis Monday Wednesday and Friday now admitted with history of syncope/fall.  In hospital cardiac arrest on Sunday, 07/02/2024 Outpatient dialysis: [facility] [place]/MWF/right PermCath/72.5 kg    ESRD: HD Monday Wednesday and Friday.  Right femoral temporary dialysis catheter placed yesterday.  Patient underwent hemodialysis treatment yesterday.  We will consider reestablishing her regular schedule today.  ANEMIA of chronic kidney disease: Hemoglobin close to target at 9.9.  Currently off ESA's.   MBD (secondary hyperparathyroidism): Restart sodium thiosulfate  for calciphylaxis of the left foot.   Hypotension/VOL/edema: Patient with A-fib with RVR.  Cardiology team following along for chronic systolic CHF.  Vasopressin  now stopped.  Maintained on amiodarone  200 mg 3 times daily.  Acute respiratory failure-requiring ventilator postcardiac arrest on 07/02/2024.  Status post extubation 07/11/2024.  Breathing comfortably at the moment.      LOS: 10 Kielyn Kardell Marcelino, MD [facility] 1/28/20268:00 AM  "

## 2024-07-12 NOTE — Progress Notes (Signed)
 PHARMACY - ANTICOAGULATION CONSULT NOTE  Pharmacy Consult for UFH infusion Indication: atrial fibrillation  Allergies[1]  Patient Measurements: Height: 5' 6 (167.6 cm) Weight: 75.9 kg (167 lb 5.3 oz) IBW/kg (Calculated) : 59.3 HEPARIN  DW (KG): 70.5  Labs: Recent Labs    07/10/24 0412 07/11/24 0425 07/12/24 0411 07/12/24 0759  HGB 10.2* 10.2* 9.9*  --   HCT 29.1* 30.4* 30.1*  --   PLT 100* 117* 134*  --   HEPARINUNFRC 0.50 0.43  --  0.48  CREATININE 4.01* 4.86* 3.22*  --     Estimated Creatinine Clearance: 19.1 mL/min (A) (by C-G formula based on SCr of 3.22 mg/dL (H)).   Medical History: Past Medical History:  Diagnosis Date   Acute lacunar infarction (HCC) 03/03/2014   a.) MRI brain 03/03/2014: two acute punctate lacunar infarcts anteriorly in the BILATERAL centrum semiovale   Adult behavior problems    Anemia of chronic renal failure    Aortic atherosclerosis    Arthritis    Atrial fibrillation and flutter (HCC)    a.) CHA2DS2VASc = 6 (sex, HFrEF, HTN, CVA x 2, prior MI/vascular disease) as of 02/04/2024; b.) rate/rhythm maintained on oral metoprolol  succinate; chronically anticoagulated with apixaban    Bipolar disorder (HCC)    Cardiomegaly    Cerebral microvascular disease    CHF (congestive heart failure) (HCC)    Chicken pox    Cholelithiasis    Chronic respiratory failure with hypoxia (HCC) 12/19/2021   COPD (chronic obstructive pulmonary disease) (HCC)    Coronary artery disease    DDD (degenerative disc disease), cervical    DDD (degenerative disc disease), thoracolumbar    Diverticulosis    Dyspnea    ESRD (end stage renal disease) on dialysis (M,W,F)    GERD (gastroesophageal reflux disease)    Gout    HCV (hepatitis C virus)    Headache    HFrEF (heart failure with reduced ejection fraction) (HCC)    History of delirium 04/12/2014   History of substance abuse (HCC)    a.) tobacco + cocaine   Hyperkalemia 11/13/2014   Hyperlipidemia     Hypertension    Hypotension 02/24/2014   Hypothyroidism    Ischemic cardiomyopathy    Nose colonized with MRSA 02/01/2024   a.) presurgical PCR (+) on 02/01/2024 prior to A/V FISTULAGRAM; b.) presurgical PCR (+) on 03/07/2024 prior to AV FISTULA CREATION   NSTEMI (non-ST elevated myocardial infarction) (HCC) 10/08/2013   NSTEMI (non-ST elevated myocardial infarction) (HCC) 01/23/2002   a.) LHC 01/24/2002 --> multi-vessel CAD with IRA being 90% OM2 --> delayed PCI until 01/25/2002 --> 3.0 x 23 mm BX Velocity stent   Obesity    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    On apixaban  therapy    Peripheral vascular disease    Pneumonia    Postoperative anemia due to acute blood loss 02/27/2014   Postoperative cerebrovascular infarction following cardiac surgery 02/21/2014   a.) developed RIGHT upper/lower extremity weakness following cardiac revascularization --> neuro consult and head CT --> Hypodensity noted in the area of the LEFT motor cortex consistent with a component of acute ischemia   Pulmonary hypertension (HCC)    RBBB (right bundle branch block)    Renal osteodystrophy    S/P CABG x 3 02/20/2014   a.) LIMA-LAD, SVG-OM1, SVG-PDA   Stroke (HCC) 05/23/2010   a.) brain MRI 05/23/2010: BILATERAL acute infarcts and old prior infarcts; RIGHT frontal lobe extending into the white matter. Small focus of restricted diffusion in  the cortex of the medial LEFT frontoparietal lobe. Periventricular T2 and multiple foci of T2 hyperintensity in the subcortical white matter and increased FLAIR signal in the cortex of the LEFT frontal lobe.   Syncope and collapse 07/25/2020   Assessment: 64 y/o F with a h/o atrial fibrillation on apixaban . Last dose was July 29, 2024 at 0900. Patient coded on 07-29-24 with subsequent ROSC and is now in the ICU on ventilation and pressor support. Pharmacy consulted for IV heparin .   Goal of Therapy:  anti-Xa level  0.3-0.7 units/ml Monitor platelets by anticoagulation  protocol: Yes   Plan:  --HL = 0.48, therapeutic x 7  --Continue heparin  infusion at 1450 units/hr --Re-check HL daily w/ AM labs while therapeutic --Daily CBC  [name] 07/12/2024 12:02 PM    [1]  Allergies Allergen Reactions   Morphine  And Codeine Hives   Levaquin [Levofloxacin] Itching and Other (See Comments)    Severe itching; prickly sensation; severe left leg pain; immobility.   Enalapril  Other (See Comments)    hallucinations   Latex Rash   Tape Rash and Other (See Comments)

## 2024-07-12 NOTE — Treatment Plan (Signed)
 Diagnosis: MRSA bacteremia Baseline Creatinine ESRD on dialysis    Allergies[1]  OPAT Orders Discharge antibiotics: Vancomycin  Per pharmacy protocol *** Aim for Vancomycin  trough 15-20 (unless otherwise indicated) Duration: *** End Date: ***  PIC Care Per Protocol:  Labs weekly while on IV antibiotics: __ CBC with differential __ BMP __ CMP __ CRP __ ESR __ Vancomycin  trough  __ Please pull PIC at completion of IV antibiotics __ Please leave PIC in place until doctor has seen patient or been notified  Fax weekly lab results  promptly to [phone]  Clinic Follow Up Appt:   Call [phone] to make appt      [1]  Allergies Allergen Reactions   Morphine  And Codeine Hives   Levaquin [Levofloxacin] Itching and Other (See Comments)    Severe itching; prickly sensation; severe left leg pain; immobility.   Enalapril  Other (See Comments)    hallucinations   Latex Rash   Tape Rash and Other (See Comments)

## 2024-07-12 NOTE — Consult Note (Signed)
 PHARMACY CONSULT NOTE - ELECTROLYTES  Pharmacy Consult for Electrolyte Monitoring and Replacement   Recent Labs: Height: 5' 6 (167.6 cm) Weight: 75.9 kg (167 lb 5.3 oz) IBW/kg (Calculated) : 59.3 Estimated Creatinine Clearance: 19.1 mL/min (A) (by C-G formula based on SCr of 3.22 mg/dL (H)). Potassium (mmol/L)  Date Value  07/12/2024 3.8  07/11/2014 4.7   Magnesium  (mg/dL)  Date Value  98/71/7973 2.2  04/10/2014 1.9   Calcium  (mg/dL)  Date Value  98/71/7973 10.0   Calcium , Total (mg/dL)  Date Value  98/74/7983 8.3 (L)   Albumin  (g/dL)  Date Value  98/73/7973 2.9 (L)  07/07/2014 3.1 (L)   Phosphorus (mg/dL)  Date Value  98/71/7973 4.0  07/09/2014 3.9   Sodium (mmol/L)  Date Value  07/12/2024 130 (L)  07/09/2014 139   Assessment  [name] is a 64 y.o. female presenting with syncope/in-hospital cardiac arrest. PMH significant for ESRD HD MWF, AF on eliquis , HFrEF, COPD, prior CVA. Pharmacy has been consulted to monitor and replace electrolytes.  Diet: Free water  flushes at 30 mL every 4 hours  Goal of Therapy:  Within normal limits  Plan:  Mild hyponatremia, improved BMP ordered for tomorrow  Thank you for allowing pharmacy to be a part of this patient's care.  [name] 07/12/2024 7:58 AM

## 2024-07-12 NOTE — Progress Notes (Signed)
 [facility] CLINIC CARDIOLOGY PROGRESS NOTE   Patient ID: [name] MRN: [id number] DOB/AGE: 1960-08-25 64 y.o.  Admit date: 07/01/2024 Referring Physician Dr. Devaughn Ban  Primary Physician Orlean Alan HERO, FNP  Primary Cardiologist Dr. Denyse Bathe Reason for Consultation syncope  HPI: [name] is a 64 y.o. female with a past medical history of chronic HFrEF, coronary artery disease, atrial fibrillation on eliquis , COPD, ESRD on HD, bipolar disorder, hypotension on midodrine , hx CVA  who presented to the ED on 07/01/2024 for syncopal event at home. Cardiology was consulted for further evaluation.   Interval History:  -Patient seen and examined this AM. Arouses to voice but does not answer questions.  - Sinus bradycardia on tele with occasional PVCs. - Vasopressin  stopped yesterday. On midodrine .  Review of systems complete and found to be negative unless listed above   Vitals:   07/12/24 0700 07/12/24 0730 07/12/24 0800 07/12/24 0830  BP: 105/63 (!) 104/57 (!) 98/56 108/60  Pulse:  (!) 58 (!) 58 (!) 55  Resp: 17 19 16 19   Temp:      TempSrc:      SpO2:  97% 97% 97%  Weight:      Height:         Intake/Output Summary (Last 24 hours) at 07/12/2024 0841 Last data filed at 07/12/2024 0700 Gross per 24 hour  Intake 626.64 ml  Output 1800 ml  Net -1173.36 ml     PHYSICAL EXAM General: Ill appearing female, no acute distress.  HEENT: Normocephalic and atraumatic. Neck: No JVD.  Lungs: Diminished bilaterally.  Heart: HRRR. Normal S1 and S2 without gallops or murmurs. Radial & DP pulses 2+ bilaterally. Abdomen: Non-distended appearing.  Msk: Normal strength and tone for age. Extremities: No clubbing, cyanosis or edema.     LABS: Basic Metabolic Panel: Recent Labs    07/11/24 0425 07/12/24 0411  NA 126* 130*  K 4.8 3.8  CL 86* 89*  CO2 20* 25  GLUCOSE 83 80  BUN 101* 51*  CREATININE 4.86* 3.22*  CALCIUM  11.2* 10.0  MG 2.5* 2.2  PHOS 5.2* 4.0    Liver Function Tests: Recent Labs    07/10/24 0412  ALBUMIN  2.9*   No results for input(s): LIPASE, AMYLASE in the last 72 hours. CBC: Recent Labs    07/11/24 0425 07/12/24 0411  WBC 13.9* 12.7*  HGB 10.2* 9.9*  HCT 30.4* 30.1*  MCV 89.9 91.5  PLT 117* 134*   Cardiac Enzymes: No results for input(s): CKTOTAL, CKMB, CKMBINDEX, TROPONINIHS in the last 72 hours. BNP: No results for input(s): BNP in the last 72 hours. D-Dimer: No results for input(s): DDIMER in the last 72 hours. Hemoglobin A1C: No results for input(s): HGBA1C in the last 72 hours. Fasting Lipid Panel: No results for input(s): CHOL, HDL, LDLCALC, TRIG, CHOLHDL, LDLDIRECT in the last 72 hours.  Thyroid  Function Tests: No results for input(s): TSH, T4TOTAL, T3FREE, THYROIDAB in the last 72 hours.  Invalid input(s): FREET3 Anemia Panel: No results for input(s): VITAMINB12, FOLATE, FERRITIN, TIBC, IRON, RETICCTPCT in the last 72 hours.  ECHOCARDIOGRAM COMPLETE Result Date: 07/11/2024    ECHOCARDIOGRAM REPORT   Patient Name:   [name] [facility] Date of Exam: 07/11/2024 Medical Rec #:  [id number]         Height:       66.0 in Accession #:    [id number]        Weight:       175.0 lb Date of Birth:  Apr 18, 1961          BSA:          1.890 m Patient Age:    63 years          BP:           107/74 mmHg Patient Gender: F                 HR:           61 bpm. Exam Location:  [facility] Procedure: 2D Echo, Cardiac Doppler and Color Doppler (Both Spectral and Color            Flow Doppler were utilized during procedure). Indications:     Bacteremia  History:         Patient has prior history of Echocardiogram examinations. Prior                  CABG; Signs/Symptoms:Chest Pain and Bacteremia.  Sonographer:     Rainelle Gull Referring Phys:  [id number] DONALD BERLIN Diagnosing Phys: Vanessa Hanson MD  Sonographer Comments: Suboptimal apical window. IMPRESSIONS  1. Left ventricular  ejection fraction, by estimation, is 25 to 30%. The left ventricle has severely decreased function. The left ventricle demonstrates regional wall motion abnormalities (see scoring diagram/findings for description). The left ventricular internal cavity size was severely dilated. Left ventricular diastolic parameters are indeterminate. There is moderate hypokinesis of the left ventricular, entire septal wall and anterior wall. There is akinesis of the left ventricular, entire  inferior wall and inferolateral wall.  2. Right ventricular systolic function is severely reduced. The right ventricular size is moderately enlarged. There is severely elevated pulmonary artery systolic pressure. The estimated right ventricular systolic pressure is 94.6 mmHg.  3. Left atrial size was severely dilated.  4. Right atrial size was severely dilated.  5. The mitral valve is normal in structure. Mild mitral valve regurgitation. No evidence of mitral stenosis.  6. Tricuspid valve regurgitation is severe.  7. The aortic valve is tricuspid. There is mild thickening of the aortic valve. Aortic valve regurgitation is not visualized. Mild to moderate aortic valve stenosis.  8. The inferior vena cava is dilated in size with <50% respiratory variability, suggesting right atrial pressure of 15 mmHg. Conclusion(s)/Recommendation(s): No evidence of valvular vegetations on this transthoracic echocardiogram. Consider a transesophageal echocardiogram to exclude infective endocarditis if clinically indicated. FINDINGS  Left Ventricle: Left ventricular ejection fraction, by estimation, is 25 to 30%. The left ventricle has severely decreased function. The left ventricle demonstrates regional wall motion abnormalities. Moderate hypokinesis of the left ventricular, entire  septal wall and anterior wall. The left ventricular internal cavity size was severely dilated. There is no left ventricular hypertrophy. Left ventricular diastolic parameters are  indeterminate. Right Ventricle: The right ventricular size is moderately enlarged. No increase in right ventricular wall thickness. Right ventricular systolic function is severely reduced. There is severely elevated pulmonary artery systolic pressure. The tricuspid regurgitant velocity is 4.46 m/s, and with an assumed right atrial pressure of 15 mmHg, the estimated right ventricular systolic pressure is 94.6 mmHg. Left Atrium: Left atrial size was severely dilated. Right Atrium: Right atrial size was severely dilated. Pericardium: There is no evidence of pericardial effusion. Mitral Valve: The mitral valve is normal in structure. Mild mitral valve regurgitation. No evidence of mitral valve stenosis. Tricuspid Valve: The tricuspid valve is normal in structure. Tricuspid valve regurgitation is severe. Aortic Valve: The aortic valve is tricuspid. There is mild thickening of the aortic valve.  Aortic valve regurgitation is not visualized. Mild to moderate aortic stenosis is present. Aortic valve mean gradient measures 6.0 mmHg. Aortic valve peak gradient  measures 12.0 mmHg. Aortic valve area, by VTI measures 1.56 cm. Pulmonic Valve: The pulmonic valve was normal in structure. Pulmonic valve regurgitation is trivial. No evidence of pulmonic stenosis. Aorta: The aortic root is normal in size and structure. Pulmonary Artery: The pulmonary artery is of normal size. Venous: The inferior vena cava is dilated in size with less than 50% respiratory variability, suggesting right atrial pressure of 15 mmHg. IAS/Shunts: The interatrial septum was not well visualized.  LEFT VENTRICLE PLAX 2D LVIDd:         7.30 cm LVIDs:         6.40 cm LV PW:         0.90 cm LV IVS:        0.90 cm LVOT diam:     2.10 cm LV SV:         54 LV SV Index:   29 LVOT Area:     3.46 cm  LV Volumes (MOD) LV vol d, MOD A2C: 242.0 ml LV vol d, MOD A4C: 186.0 ml LV vol s, MOD A2C: 176.0 ml LV vol s, MOD A4C: 126.0 ml LV SV MOD A2C:     66.0 ml LV SV MOD  A4C:     186.0 ml LV SV MOD BP:      68.2 ml RIGHT VENTRICLE            IVC RV Basal diam:  6.00 cm    IVC diam: 2.30 cm RV Mid diam:    4.80 cm RV S prime:     6.20 cm/s TAPSE (M-mode): 1.3 cm LEFT ATRIUM              Index        RIGHT ATRIUM           Index LA Vol (A2C):   126.0 ml 66.68 ml/m  RA Area:     30.50 cm LA Vol (A4C):   114.0 ml 60.33 ml/m  RA Volume:   107.00 ml 56.62 ml/m LA Biplane Vol: 120.0 ml 63.50 ml/m  AORTIC VALVE                     PULMONIC VALVE AV Area (Vmax):    1.67 cm      PV Vmax:          0.91 m/s AV Area (Vmean):   1.60 cm      PV Vmean:         61.200 cm/s AV Area (VTI):     1.56 cm      PV VTI:           0.226 m AV Vmax:           173.00 cm/s   PV Peak grad:     3.3 mmHg AV Vmean:          111.000 cm/s  PV Mean grad:     2.0 mmHg AV VTI:            0.346 m       PR End Diast Vel: 5.64 msec AV Peak Grad:      12.0 mmHg AV Mean Grad:      6.0 mmHg LVOT Vmax:         83.60 cm/s LVOT Vmean:        51.200 cm/s LVOT  VTI:          0.156 m LVOT/AV VTI ratio: 0.45  AORTA Ao Root diam: 2.80 cm Ao Asc diam:  2.40 cm TRICUSPID VALVE TR Peak grad:   79.6 mmHg TR Vmax:        446.00 cm/s  SHUNTS Systemic VTI:  0.16 m Systemic Diam: 2.10 cm Vanessa Hanson MD Electronically signed by Vanessa Hanson MD Signature Date/Time: 07/11/2024/5:30:36 PM    Final    DG Abd 1 View Result Date: 07/10/2024 EXAM: 1 VIEW XRAY OF THE ABDOMEN 07/10/2024 09:51:00 AM COMPARISON: 07/04/2024 CLINICAL HISTORY: Encounter for nasogastric tube placement. ICD10: 747668 Encounter for nasogastric tube placement. FINDINGS: LINES, TUBES AND DEVICES: Enteric tube in place with distal tip and side port terminating within the expected location of the gastric body. BOWEL: Nonobstructive bowel gas pattern. SOFT TISSUES: Vascular calcifications. BONES: No acute fracture. PLEURAL SPACES: Left pleural effusion. LUNGS: Left basilar airspace opacity. IMPRESSION: 1. Enteric tube in place with distal tip and side port  terminating within the expected location of the gastric body. Electronically signed by: Waddell Calk MD 07/10/2024 10:10 AM EST RP Workstation: [id number]      ECHO 06/23/2024: 1. Left ventricular ejection fraction, by estimation, is 25 to 30%. The left ventricle has severely decreased function. The left ventricle demonstrates global hypokinesis. The left ventricular internal cavity size was severely dilated. Left ventricular diastolic function could not be evaluated.   2. Right ventricular systolic function is severely reduced. The right  ventricular size is severely enlarged. Mildly increased right ventricular  wall thickness.   3. Left atrial size was mildly dilated.   4. Right atrial size was severely dilated.   5. The mitral valve is normal in structure. Mild mitral valve regurgitation.   6. Tricuspid valve regurgitation is severe.   7. The aortic valve is grossly normal. Aortic valve regurgitation is not visualized. Aortic valve sclerosis is present, with no evidence of aortic  valve stenosis.   TELEMETRY (personally reviewed): Sinus rhythm rate 50s, PVCs  EKG (personally reviewed): AF RBBB rate 167 bpm  DATA reviewed by me 07/12/24: last 24h vitals tele labs imaging I/O, PCCM progress note  Principal Problem:   Syncope and collapse Active Problems:   ESRD on hemodialysis (HCC)   Chronic hypotension   S/P CABG x 3   Mild dementia associated with other underlying disease, without behavioral disturbance, psychotic disturbance, mood disturbance, or anxiety (HCC)   Anemia of chronic disease   ESRD (end stage renal disease) (HCC)   Paroxysmal atrial fibrillation (HCC)   Hypothyroidism   Chronic combined systolic and diastolic CHF (congestive heart failure) (HCC)   Hepatitis C antibody positive in blood   Other cirrhosis of liver (HCC)   Hypertension   Bipolar disorder in full remission, most recent episode unspecified type   Chronic pain syndrome   Other secondary kyphosis,  cervical region   Cervical spondylosis   Fall   Acute pulmonary edema (HCC)   Bacteremia due to Streptococcus   MRSA bacteremia   ESRD (end stage renal disease) on dialysis (HCC)   Acute on chronic respiratory failure with hypoxia (HCC)   Acute on chronic HFrEF (heart failure with reduced ejection fraction) (HCC)   Cardiac arrest with ventricular fibrillation (HCC)    ASSESSMENT AND PLAN: [name] is a 64 y.o. female with a past medical history of chronic HFrEF, coronary artery disease, atrial fibrillation on eliquis , COPD, ESRD on HD, bipolar disorder, hypotension on midodrine , hx CVA  who  presented to the ED on 07/01/2024 for syncopal event at home. Cardiology was consulted for further evaluation.   # Atrial fibrillation RVR # Paroxysmal atrial fibrillation # Syncope/ ?cardiac arrest # Chronic HFrEF # Coronary artery disease Patient brought to ED after being found down at home by family. Initially was admitted to the floor but on 07/02/24 she became unresponsive and was tachycardia, concern for wide complex tachycardia but has RBBB at baseline. Underwent ACLS, received 1 shock and 2 doses of epi with ROSC. Started on IV amiodarone .  Lidocaine  started 07/04/2024 by EP.  Overnight on 07/05/2024 developed bradycardia and thus IV amiodarone  was discontinued. 07/05/24 EP discontinued lidocaine  and restarted amiodarone .  -EP signed off, appreciate their assistance. Given bacteremia and encephalopathy would not be candidate for device at this time.  -Continue IV heparin .  -Continue PO amiodarone  loading. -Further management per primary team, appreciate their assistance. No plan for invasive cardiac procedures at this time. Discussed with patient's daughter 07/11/24 that overall prognosis is poor, daughter expressed that family agrees with planning for medical management with no procedures.  -Agree with palliative care evaluation for continued goals of care conversations.  This patient's case  was discussed and created with Dr. Wilburn and he is in agreement.  Signed:  Danita Bloch, PA-C  07/12/2024, 8:41 AM [facility]

## 2024-07-12 NOTE — Progress Notes (Signed)
 "  Date of Admission:  07/01/2024    ID: [name] is a 64 y.o. female  Principal Problem:   Syncope and collapse Active Problems:   ESRD on hemodialysis (HCC)   Chronic hypotension   S/P CABG x 3   Mild dementia associated with other underlying disease, without behavioral disturbance, psychotic disturbance, mood disturbance, or anxiety (HCC)   Anemia of chronic disease   ESRD (end stage renal disease) (HCC)   Paroxysmal atrial fibrillation (HCC)   Hypothyroidism   Chronic combined systolic and diastolic CHF (congestive heart failure) (HCC)   Hepatitis C antibody positive in blood   Other cirrhosis of liver (HCC)   Hypertension   Bipolar disorder in full remission, most recent episode unspecified type   Chronic pain syndrome   Other secondary kyphosis, cervical region   Cervical spondylosis   Fall   Acute pulmonary edema (HCC)   Bacteremia due to Streptococcus   MRSA bacteremia   ESRD (end stage renal disease) on dialysis (HCC)   Acute on chronic respiratory failure with hypoxia (HCC)   Acute on chronic HFrEF (heart failure with reduced ejection fraction) (HCC)   Cardiac arrest with ventricular fibrillation (HCC)   Toxic metabolic encephalopathy   Pressure injury of skin  Brother at bed side  Subjective: Non verbal eyes open  Medications:   amiodarone   200 mg Per Tube TID   Chlorhexidine  Gluconate Cloth  6 each Topical Daily   collagenase   1 Application Topical Daily   free water   30 mL Per Tube Q4H   levothyroxine   75 mcg Per Tube Q0600   metoprolol  tartrate  12.5 mg Per Tube BID   midodrine   15 mg Per Tube TID WC   multivitamin  1 tablet Per Tube QHS   nutrition supplement (JUVEN)  1 packet Per Tube BID BM   sodium chloride  flush  10-40 mL Intracatheter Q12H   vancomycin  variable dose per unstable renal function (pharmacist dosing)   Does not apply See admin instructions    Objective: Vital signs in last 24 hours: Patient Vitals for the past 24 hrs:  BP  Temp Temp src Pulse Resp SpO2 Weight  07/12/24 1700 102/62 -- -- (!) 54 19 98 % --  07/12/24 1600 (!) 107/59 -- -- (!) 54 18 98 % --  07/12/24 1500 109/61 -- -- (!) 51 16 97 % --  07/12/24 1400 119/64 -- -- (!) 55 (!) 21 96 % --  07/12/24 1300 96/63 -- -- (!) 51 16 99 % --  07/12/24 1230 (!) 96/57 -- -- (!) 55 17 98 % --  07/12/24 1216 -- (!) 96.7 F (35.9 C) Axillary -- -- -- --  07/12/24 1200 103/63 -- -- (!) 59 19 99 % --  07/12/24 1130 (!) 104/55 -- -- (!) 45 20 100 % --  07/12/24 1100 112/66 -- -- (!) 55 19 98 % --  07/12/24 1030 108/69 -- -- (!) 51 18 98 % --  07/12/24 1000 108/60 (!) 96.8 F (36 C) Axillary (!) 56 (!) 21 98 % --  07/12/24 0930 (!) 112/56 -- -- (!) 56 19 99 % --  07/12/24 0900 105/63 -- -- (!) 56 19 96 % --  07/12/24 0830 108/60 -- -- (!) 55 19 97 % --  07/12/24 0800 (!) 98/56 -- -- (!) 58 16 97 % --  07/12/24 0730 (!) 104/57 -- -- (!) 58 19 97 % --  07/12/24 0700 105/63 -- -- -- 17 -- --  07/12/24 0630 101/65 -- -- 61 18 100 % --  07/12/24 0600 (!) 104/54 -- -- (!) 58 18 97 % --  07/12/24 0530 (!) 110/56 -- -- (!) 58 19 98 % --  07/12/24 0500 (!) 104/52 -- -- (!) 59 17 98 % 75.9 kg  07/12/24 0430 (!) 101/57 -- -- (!) 59 19 97 % --  07/12/24 0400 (!) 107/56 (!) 97 F (36.1 C) Axillary 67 18 97 % --  07/12/24 0345 (!) 96/58 -- -- 62 19 98 % --  07/12/24 0330 101/60 -- -- 61 20 98 % --  07/12/24 0315 (!) 103/56 -- -- 62 (!) 21 98 % --  07/12/24 0300 107/62 -- -- 64 (!) 21 94 % --  07/12/24 0245 (!) 106/52 -- -- 72 18 95 % --  07/12/24 0230 (!) 105/59 -- -- 64 19 95 % --  07/12/24 0215 104/64 -- -- 61 19 95 % --  07/12/24 0200 110/62 -- -- 66 19 95 % --  07/12/24 0145 104/67 -- -- 63 17 95 % --  07/12/24 0130 109/65 -- -- 63 (!) 22 95 % --  07/12/24 0115 104/63 -- -- 63 18 95 % --  07/12/24 0100 103/66 -- -- 63 20 95 % --  07/12/24 0045 110/61 -- -- 66 19 95 % --  07/12/24 0030 102/61 -- -- 67 (!) 21 95 % --  07/12/24 0015 (!) 104/57 -- -- 68 20 95 % --   07/12/24 0000 112/64 -- -- 63 18 95 % --  07/11/24 2345 99/62 -- -- 72 (!) 21 95 % --  07/11/24 2330 112/67 -- -- 63 18 95 % --  07/11/24 2315 105/61 -- -- 61 18 91 % --  07/11/24 2300 113/65 -- -- (!) 58 17 97 % --  07/11/24 2245 106/64 -- -- 60 19 98 % --  07/11/24 2230 (!) 111/48 -- -- (!) 56 19 95 % --  07/11/24 2215 (!) 106/58 -- -- (!) 50 (!) 22 95 % --  07/11/24 2200 (!) 107/54 -- -- (!) 51 20 97 % --  07/11/24 2145 (!) 107/59 -- -- (!) 51 20 95 % --  07/11/24 2130 (!) 112/54 -- -- (!) 58 18 93 % --  07/11/24 2115 (!) 96/56 -- -- 64 20 94 % --  07/11/24 2100 95/65 -- -- 60 20 94 % --  07/11/24 2045 (!) 105/58 -- -- 68 19 94 % --  07/11/24 2030 (!) 101/58 -- -- (!) 57 (!) 23 93 % --  07/11/24 2015 105/66 -- -- 68 (!) 22 93 % --  07/11/24 2000 (!) 111/56 -- -- 60 (!) 23 96 % --  07/11/24 1945 (!) 110/56 -- -- (!) 52 (!) 23 95 % --  07/11/24 1930 109/63 (!) 97.5 F (36.4 C) Axillary 61 18 95 % --  07/11/24 1915 105/60 -- -- 63 (!) 21 95 % --  07/11/24 1900 113/81 -- -- (!) 41 12 95 % --  07/11/24 1848 109/71 -- -- (!) 56 (!) 25 98 % 72.3 kg  07/11/24 1845 -- -- -- (!) 51 (!) 21 96 % --  07/11/24 1841 101/66 -- -- (!) 59 19 96 % --  07/11/24 1830 (!) 105/45 -- -- 67 (!) 22 97 % --  07/11/24 1815 (!) 102/55 -- -- (!) 56 18 96 % --       PHYSICAL EXAM:  General: eyes open, non verbal Rt torticollis-  Lungs: b/la ir  enry Heart: s1s2 Abdomen: Soft, non-tender,not distended. Bowel sounds normal. No masses Extremities: atraumatic, no cyanosis. No edema. No clubbing Skin: No rashes or lesions. Or bruising Lymph: Cervical, supraclavicular normal. Neurologic: cannot assess  Lab Results    Latest Ref Rng & Units 07/12/2024    4:11 AM 07/11/2024    4:25 AM 07/10/2024    4:12 AM  CBC  WBC 4.0 - 10.5 K/uL 12.7  13.9  12.4   Hemoglobin 12.0 - 15.0 g/dL 9.9  89.7  89.7   Hematocrit 36.0 - 46.0 % 30.1  30.4  29.1   Platelets 150 - 400 K/uL 134  117  100        Latest Ref Rng  & Units 07/12/2024    4:11 AM 07/11/2024    4:25 AM 07/10/2024    4:12 AM  CMP  Glucose 70 - 99 mg/dL 80  83  893   BUN 8 - 23 mg/dL 51  898  94   Creatinine 0.44 - 1.00 mg/dL 6.77  5.13  5.98   Sodium 135 - 145 mmol/L 130  126  126   Potassium 3.5 - 5.1 mmol/L 3.8  4.8  4.1   Chloride 98 - 111 mmol/L 89  86  86   CO2 22 - 32 mmol/L 25  20  21    Calcium  8.9 - 10.3 mg/dL 89.9  88.7  89.2       Microbiology: 07/02/24 BC MRSA anerobic bottle of 1 set Strep gordonii in the 2nd set Staph epidermidis second ser  07/04/24 BC NG 07/05/24 ascites NG 07/10/24 BC NG   Assessment/Plan: 64 y.o. female with a history of ESRD, CAD s/p CABG, Afib ICM, h/o pericardial effusion h/o PEA arrest , steal syndrome rt arm with ligation of rt brachial artery to axillary vein graft on 04/20/24 recently in the hospital between 06/22/24 until 06/28/24 for syncope, fluid over load, chronic hypotension, cardiogenic shock EF 25-30%,  presenting with fall/ weakness and found on the floor   Syncope Related to her cardiac state    Severe ischemic cardiomyopathy with biventricular failure    ESRD on dialysis thru a catheter   Ventricular fibrillation and cardiac arrest on 07/02/24- resucitated -     Strep gordonii bacteremia- MRSA bacteremia   blood culture done after resuscitation though she had fever on the day of admission  but no blood  culture sent on admission Likely related to dialysis cath HD cath removed  Repeat blood cultrue X 2 NG 2 d echo no vegetations valves  On Vancomycin   Will give for 4 weeks total 07/31/24 during dialysis    C5-C6 vertebral  endplates collapse- degeneration VS burnt out infection     Ascites s/p paracentesis- possible cirrhosis could be cardiac cirrhosis Cell coulnt 605 ( 44% N) Protein not sent Culture neg     Chronic thrombocytopenia  likely due to cirrhosis Anemia      HEPC antibody positive but RNA negative -= so no active infection   Stasis dermatitis with   ulcers lower extremity- 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 Discussed the management with brother  and intensivist  OPAT note will be written  ID will sign off- call if needed   "

## 2024-07-12 NOTE — Progress Notes (Signed)
 Hypoglycemic Event @1625   CBG: 69  Treatment: D50 25 mL (12.5 gm) @ 1629  Symptoms: None  Follow-up CBG: Time:1645 CBG Result:122  Possible Reasons for Event: Inadequate meal intake

## 2024-07-12 NOTE — Plan of Care (Signed)
" °  Problem: Education: Goal: Knowledge of General Education information will improve Description: Including pain rating scale, medication(s)/side effects and non-pharmacologic comfort measures Outcome: Progressing   Problem: Clinical Measurements: Goal: Ability to maintain clinical measurements within normal limits will improve Outcome: Progressing Goal: Diagnostic test results will improve Outcome: Progressing Goal: Respiratory complications will improve Outcome: Progressing Goal: Cardiovascular complication will be avoided Outcome: Progressing   Problem: Nutrition: Goal: Adequate nutrition will be maintained Outcome: Not Progressing   "

## 2024-07-12 NOTE — Progress Notes (Signed)
" °  Daily Progress Note   Patient Name: [name]       Date: 07/12/2024 DOB: Dec 15, 1960  Age: 64 y.o. MRN#: [id number] Attending Physician: Isadora Hose, MD Primary Care Physician: Orlean Alan HERO, FNP Admit Date: 07/01/2024 Length of Stay: 10 days  Reason for Consultation/Follow-up: Establishing goals of care  Subjective:   CC: Establishing goals of care  Subjective: Chart reviewed including most recent notes from hospitalist, nephrology, and cardiology.  Labs reviewed this morning and notable for sodium 130, creatinine 3.22, white blood count 12.7, hemoglobin 9.9  I saw and examined [name] today.  She was lying in bed in no distress at time of my encounter.  She was a little more interactive with me today.  She much more reliably track me in the room and attempted to whisper answer to a couple of simple questions.  Denied pain or shortness of breath.  She still does not seem to have insight into her condition and is not able to participate in any kind of goals of care conversation.  Discussed with family via phone yesterday and plan is to give some time for clinical outcomes.  Will plan to check in later this week, however, I also advised family to have staff page when they are visiting if they would like for our team to check in with them today or tomorrow.  Review of Systems Unable to obtain  Objective:   Vital Signs:  BP 96/63   Pulse (!) 51   Temp (!) 96.7 F (35.9 C) (Axillary)   Resp 16   Ht 5' 6 (1.676 m)   Wt 75.9 kg   SpO2 99%   BMI 27.01 kg/m   Physical Exam: General: Awake, tracks and tries to mumble some words  HENT: normocephalic, atraumatic, moist mucous membranes Cardiovascular: RRR Respiratory: No increased work of breathing  abdomen: not distended Skin: no rashes or lesions on visible skin  Assessment & Plan:   Assessment: 64 year old female with end-stage renal disease on HD, COPD, CHF, bipolar disorder, prior CVA admitted  respiratory failure V-fib arrest gastroparesis and gram-positive bacteremia.  Remains encephalopathic overall but appears more awake today than yesterday. Recommendations/Plan: # Complex medical decision making/goals of care:  - DNR/DNI.  Confirmed with daughter on 1/27 via phone.  - Family understands severity of her situation but wanting to see if her mental status clears over the next day or 2.  Palliative will plan to check in with them later this week and remain available to meet with them if desired in the interim.  -  Code Status: Limited: Do not attempt resuscitation (DNR) -DNR-LIMITED -Do Not Intubate/DNI   Prognosis: guarded  # Psychosocial Support:  - Family, including children, brother, and sister  # Discharge Planning: To Be Determined  Discussed with: Bedside staff  Thank you for allowing the palliative care team to participate in the care [name]  Amaryllis Meissner, MD Palliative Care Provider PMT # [phone]  If patient remains symptomatic despite maximum doses, please call PMT at [phone] between 0700 and 1900. Outside of these hours, please call attending, as PMT does not have night coverage.  I personally spent a total of 25 minutes in the care of the patient today including preparing to see the patient, getting/reviewing separately obtained history, performing a medically appropriate exam/evaluation, and documenting clinical information in the EHR.  "

## 2024-07-12 NOTE — Progress Notes (Signed)
 "  NAME:  [name], MRN:  [id number], DOB:  1960/11/24, LOS: 10 ADMISSION DATE:  07/01/2024, CHIEF COMPLAINT:  Syncope, V-Fib Arrest  History of Present Illness:  [name] is a 64 y.o. female with a pmh of of ESRD on HD (MWF), COPD, CHF, bipolar disorder, chronic hypotension on midodrine , HLD and prior CVA who presented to [facility] ED via EMS following syncopal episode.    History was gathered per chart review Per patient, her normal dialysis schedule is MWF.  Patient was recently admitted to the intensive care unit earlier this month July 03, 2024.  At that time she was treated for acute on chronic hypoxemia with circulatory shock since then she has been discharged home and while at home was found down by her family and was brought into the hospital.  After receiving medical management on the mat on the floor and dialysis patient had episode of pulselessness with wide complex tachyarrhythmia.  Rapid response was initiated and ACLS was started on patient.  She received 2 rounds of epinephrine  as well as shock with return of spontaneous circulation.  1 dose of IV amiodarone  was delivered and cardiology was contacted with initiation of amiodarone  infusion.  Patient was brought into the ICU on ventilator and intubated by EDP during CODE BLUE.  Pertinent  Medical History  07/02/24-transferred to ICU post CODE BLUE on medical floor status post ACLS with ROSC  07/03/24- overnight, unresposive with AF. MWF(HD). Will meet with family. Will continue to monitor and re-evaluate post dialysis.  07/04/24- patient had few episodes of VT. She is DNR code status.  She was seen by cardiology, she has poor prognosis. No procedures are planned.  07/05/24- patient had hypotensive episode during HD so it was interuppted.  The lidocaine  infusion will be dcd today and amiodarone  will continue.  Plan for paracentesis today. 07/06/24- patient with no events overnight, did have a non sustained run of VT similar to  previous.  Performing awakening trial today.  Due to staph/strep bacteremia the tunnelled vascCath has to come out.  07/07/24 - started PO amiodarone , MRI brain unremarkable 07/08/24 - more awake and interactive 07/09/24 - intubated and off sedation, failed WUA 07/10/24 - more awake, following commands, extubated 07/11/24 - HD catheter placed, triple lumen removed from left groin 07/12/24 - off vasopressin   Significant Hospital Events: Including procedures, antibiotic start and stop dates in addition to other pertinent events   Hypotension & Hypertension HLD HFrEF ESRD on HD Stroke CAD s/p CABG x 3 RBBB Pulmonary hypertension PVD Ischemic cardiomyopathy Hyperkalemia Hypothyroidism Atrial fibrillation on eliquis  Anemia of chronic kidney disease  Interim History / Subjective:  Awake but disoriented, follows commands, verbal but confused.  Objective    Blood pressure 96/63, pulse (!) 51, temperature (!) 96.7 F (35.9 C), temperature source Axillary, resp. rate 16, height 5' 6 (1.676 m), weight 75.9 kg, SpO2 99%.        Intake/Output Summary (Last 24 hours) at 07/12/2024 1409 Last data filed at 07/12/2024 1400 Gross per 24 hour  Intake 933.16 ml  Output 1800 ml  Net -866.84 ml   Filed Weights   07/11/24 1508 07/11/24 1848 07/12/24 0500  Weight: 74.1 kg 72.3 kg 75.9 kg    Examination: Physical Exam Constitutional:      General: She is not in acute distress.    Appearance: She is ill-appearing.  Cardiovascular:     Rate and Rhythm: Normal rate and regular rhythm.     Pulses: Normal pulses.  Heart sounds: Normal heart sounds.  Pulmonary:     Effort: Pulmonary effort is normal.     Breath sounds: Normal breath sounds. No wheezing.  Neurological:     Mental Status: She is disoriented.     Motor: Weakness present.      Assessment and Plan   #Cardiac Arrest  #Ventricular Fibrillation #CAD  #Ischemic Cardiomyopathy #Septic Shock  #Gram Positive  Bacteremia #COPD #Afib on Eliquis  #ESRD on HD #HyperKalemia  Neurology - extubated with improvement in mental status, but not quite back to her baseline. MRI brain without acute pathology. Continue to avoid psychotropic medications as able.  Cardiovascular - V. Fib arrest for which she was intubated and started on amiodarone  (and previously received lidocaine ). She has atrial fibrillation as well as HFrEF (LVEF 25% with global hypokinesis) and a dysfunctional and enlarged right ventricle. On PO amiodarone  metoprolol  . Coox with O2 sat of 81%. Will need GDMT, cardiology and EP following.   -Amiodarone  200 mg tid  -Metoprolol  12.5 mg bid  -off vasopressin , BP within normal  -increased midodrine   -continue IV heparin   Pulmonary - respiratory failure in the setting of cardiac arrest, now extubated as of 07/10/24 and currently on nasal cannula.  Gastrointestinal - s/p paracentesis (3.5 liters). Holding tube feeds with extubation and with finding of gastroparesis. PPI for SUP, discontinued.  -resume trickle feeds  Renal - ESRD on HD through a tunneled dialysis catheter. With concern for bacteremia this was explanted. Placed new HD catheter in the right groin, with plan for tunneled catheter placement later in the week with vascular surgery. Discussed with ID, Renal and Vascular.  Endocrine - ICU glycemic protocol.  Hem/Onc - heparin  gtt given Afib.  ID - gram positive bacteremia with staph gordonii as well as MRSA. Tunneled catheter explanted on 1/24. Repeat blood cultures remain negative. Continued on IV antibiotics. New temporary HD catheter placed 1/27, and triple lumen catheter removed.  -continue CTX and Vancomycin   Other  - family updated over the phone. Spoke to Brother Mr. Gunnarson and Daughter [name] Ingalls.  Labs   CBC: Recent Labs  Lab 07/07/24 0412 07/09/24 0425 07/10/24 0412 07/11/24 0425 07/12/24 0411  WBC 17.1* 14.2* 12.4* 13.9* 12.7*  HGB 11.5* 10.3* 10.2* 10.2* 9.9*   HCT 32.1* 29.0* 29.1* 30.4* 30.1*  MCV 86.5 85.3 86.6 89.9 91.5  PLT 84* 90* 100* 117* 134*    Basic Metabolic Panel: Recent Labs  Lab 07/06/24 0407 07/07/24 0412 07/08/24 0428 07/09/24 0425 07/10/24 0412 07/11/24 0425 07/12/24 0411  NA 130* 129* 130* 129* 126* 126* 130*  K 4.0 4.1 3.7 4.0 4.1 4.8 3.8  CL 90* 89* 89* 87* 86* 86* 89*  CO2 26 26 26 24  21* 20* 25  GLUCOSE 96 108* 97 102* 106* 83 80  BUN 29* 58* 48* 74* 94* 101* 51*  CREATININE 2.83* 3.63* 2.51* 3.45* 4.01* 4.86* 3.22*  CALCIUM  10.0 10.9* 9.9 10.7* 10.7* 11.2* 10.0  MG 2.1 2.1 2.1  --   --  2.5* 2.2  PHOS 2.8 3.0 2.2* 3.1 3.6 5.2* 4.0   GFR: Estimated Creatinine Clearance: 19.1 mL/min (A) (by C-G formula based on SCr of 3.22 mg/dL (H)). Recent Labs  Lab 07/09/24 0425 07/10/24 0412 07/11/24 0425 07/12/24 0411  WBC 14.2* 12.4* 13.9* 12.7*    Liver Function Tests: Recent Labs  Lab 07/06/24 0407 07/07/24 0412 07/09/24 0425 07/10/24 0412  ALBUMIN  3.0* 2.8* 2.8* 2.9*   No results for input(s): LIPASE, AMYLASE in the last 168 hours.  No results for input(s): AMMONIA in the last 168 hours.   ABG    Component Value Date/Time   PHART 7.5 (H) 07/02/2024 2058   PCO2ART 33 07/02/2024 2058   PO2ART 202 (H) 07/02/2024 2058   HCO3 25.7 07/02/2024 2058   TCO2 26 04/20/2024 0708   ACIDBASEDEF 0.2 07/01/2024 2155   O2SAT 81.8 07/07/2024 2029     Coagulation Profile: No results for input(s): INR, PROTIME in the last 168 hours.   Cardiac Enzymes: No results for input(s): CKTOTAL, CKMB, CKMBINDEX, TROPONINI in the last 168 hours.  HbA1C: No results found for: HGBA1C  CBG: Recent Labs  Lab 07/11/24 2036 07/12/24 0008 07/12/24 0334 07/12/24 0801 07/12/24 1158  GLUCAP 84 91 70 75 77    Review of Systems:   N/A  Past Medical History:  She,  has a past medical history of Acute lacunar infarction (HCC) (03/03/2014), Adult behavior problems, Anemia of chronic renal failure,  Aortic atherosclerosis, Arthritis, Atrial fibrillation and flutter (HCC), Bipolar disorder (HCC), Cardiomegaly, Cerebral microvascular disease, CHF (congestive heart failure) (HCC), Chicken pox, Cholelithiasis, Chronic respiratory failure with hypoxia (HCC) (12/19/2021), COPD (chronic obstructive pulmonary disease) (HCC), Coronary artery disease, DDD (degenerative disc disease), cervical, DDD (degenerative disc disease), thoracolumbar, Diverticulosis, Dyspnea, ESRD (end stage renal disease) on dialysis (M,W,F), GERD (gastroesophageal reflux disease), Gout, HCV (hepatitis C virus), Headache, HFrEF (heart failure with reduced ejection fraction) (HCC), History of delirium (04/12/2014), History of substance abuse (HCC), Hyperkalemia (11/13/2014), Hyperlipidemia, Hypertension, Hypotension (02/24/2014), Hypothyroidism, Ischemic cardiomyopathy, Nose colonized with MRSA (02/01/2024), NSTEMI (non-ST elevated myocardial infarction) (HCC) (10/08/2013), NSTEMI (non-ST elevated myocardial infarction) (HCC) (01/23/2002), Obesity, 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, On apixaban  therapy, Peripheral vascular disease, Pneumonia, Postoperative anemia due to acute blood loss (02/27/2014), Postoperative cerebrovascular infarction following cardiac surgery (02/21/2014), Pulmonary hypertension (HCC), RBBB (right bundle branch block), Renal osteodystrophy, S/P CABG x 3 (02/20/2014), Stroke (HCC) (05/23/2010), and Syncope and collapse (07/25/2020).   Surgical History:   Past Surgical History:  Procedure Laterality Date   A/V FISTULAGRAM Left 03/30/2019   Procedure: A/V FISTULAGRAM;  Surgeon: Marea Selinda RAMAN, MD;  Location: [facility];  Service: Cardiovascular;  Laterality: Left;   A/V FISTULAGRAM Left 10/06/2019   Procedure: A/V FISTULAGRAM;  Surgeon: Marea Selinda RAMAN, MD;  Location: [facility];  Service: Cardiovascular;  Laterality: Left;   A/V FISTULAGRAM Left 05/19/2021   Procedure: A/V FISTULAGRAM;   Surgeon: Marea Selinda RAMAN, MD;  Location: [facility];  Service: Cardiovascular;  Laterality: Left;   A/V FISTULAGRAM Left 01/29/2022   Procedure: A/V Fistulagram;  Surgeon: Marea Selinda RAMAN, MD;  Location: [facility];  Service: Cardiovascular;  Laterality: Left;   A/V FISTULAGRAM Left 04/30/2022   Procedure: A/V Fistulagram;  Surgeon: Marea Selinda RAMAN, MD;  Location: [facility];  Service: Cardiovascular;  Laterality: Left;   A/V FISTULAGRAM Left 08/20/2022   Procedure: A/V Fistulagram;  Surgeon: Marea Selinda RAMAN, MD;  Location: [facility];  Service: Cardiovascular;  Laterality: Left;   A/V FISTULAGRAM N/A 12/03/2022   Procedure: A/V Fistulagram;  Surgeon: Marea Selinda RAMAN, MD;  Location: [facility];  Service: Cardiovascular;  Laterality: N/A;   A/V FISTULAGRAM Left 02/11/2023   Procedure: A/V Fistulagram;  Surgeon: Marea Selinda RAMAN, MD;  Location: [facility];  Service: Cardiovascular;  Laterality: Left;   A/V FISTULAGRAM Left 02/07/2024   Procedure: A/V Fistulagram;  Surgeon: Marea Selinda RAMAN, MD;  Location: [facility];  Service: Cardiovascular;  Laterality: Left;   COLONOSCOPY     COLONOSCOPY WITH PROPOFOL  N/A 04/22/2021   Procedure: COLONOSCOPY WITH PROPOFOL ;  Surgeon: Maryruth Ole DASEN, MD;  Location: [facility];  Service: Endoscopy;  Laterality: N/A;   CORONARY ANGIOPLASTY WITH STENT PLACEMENT Left 01/25/2002   Procedure: CORONARY ANGIOPLASTY WITH STENT PLACEMENT; Location: [facility]; Surgeon: Margie Lovelace, MD   CORONARY ARTERY BYPASS GRAFT N/A 02/20/2014   Procedure: CORONARY ARTERY BYPASS GRAFT; Location: [facility]; Surgeon: Reyes Fruits, MD   DIALYSIS FISTULA CREATION     DIALYSIS/PERMA CATHETER INSERTION N/A 04/08/2023   Procedure: DIALYSIS/PERMA CATHETER INSERTION;  Surgeon: Marea Selinda RAMAN, MD;  Location: [facility];  Service: Cardiovascular;  Laterality: N/A;   DIALYSIS/PERMA CATHETER REMOVAL N/A 07/06/2023   Procedure: DIALYSIS/PERMA  CATHETER REMOVAL;  Surgeon: [name] Cordella MATSU, MD;  Location: [facility];  Service: Cardiovascular;  Laterality: N/A;   ESOPHAGOGASTRODUODENOSCOPY     ESOPHAGOGASTRODUODENOSCOPY N/A 04/22/2021   Procedure: ESOPHAGOGASTRODUODENOSCOPY (EGD);  Surgeon: Maryruth Ole DASEN, MD;  Location: [facility] ENDOSCOPY;  Service: Endoscopy;  Laterality: N/A;   FLEXIBLE SIGMOIDOSCOPY N/A 02/23/2019   Procedure: FLEXIBLE SIGMOIDOSCOPY;  Surgeon: Gaylyn Gladis PENNER, MD;  Location: [facility] ENDOSCOPY;  Service: Endoscopy;  Laterality: N/A;   IABP INSERTION  02/20/2014   Procedure: INTRA-AORTIC BALLOON PUMP INSERTION; Location: [facility]; Surgeon: Reyes Fruits, MD   INSERTION OF ARTERIOVENOUS (AV) ARTEGRAFT ARM Right 03/09/2024   Procedure: INSERTION, GRAFT, ARTERIOVENOUS, UPPER EXTREMITY;  Surgeon: Marea Selinda RAMAN, MD;  Location: [facility];  Service: Vascular;  Laterality: Right;  BRACHIAL AXILLARY   INSERTION OF DIALYSIS CATHETER N/A 04/20/2024   Procedure: INSERTION OF DIALYSIS CATHETER;  Surgeon: Marea Selinda RAMAN, MD;  Location: [facility];  Service: Vascular;  Laterality: N/A;   LEFT HEART CATH AND CORONARY ANGIOGRAPHY Left 01/24/2002   Procedure: LEFT HEART CATH AND CORONARY ANGIOGRAPHY; Location: [facility]; Surgeon: Margie Lovelace, MD   LEFT HEART CATH AND CORONARY ANGIOGRAPHY Left 05/25/2012   Procedure: LEFT HEART CATH AND CORONARY ANGIOGRAPHY; Location: [facility]; Surgeon: Margie Lovelace, MD   LEFT HEART CATH AND CORONARY ANGIOGRAPHY Left 10/09/2013   Procedure: LEFT HEART CATH AND CORONARY ANGIOGRAPHY; Location: [facility]; Surgeon: Vinie Jude, MD   LEFT HEART CATH AND CORS/GRAFTS ANGIOGRAPHY N/A 08/08/2018   Procedure: LEFT HEART CATH AND CORS/GRAFTS ANGIOGRAPHY;  Surgeon: Fernand Denyse LABOR, MD;  Location: [facility];  Service: Cardiovascular;  Laterality: N/A;   LEFT HEART CATH AND CORS/GRAFTS ANGIOGRAPHY N/A 01/30/2019   Procedure: LEFT HEART CATH AND CORS/GRAFTS ANGIOGRAPHY;  Surgeon: Fernand Denyse LABOR, MD;  Location:  [facility];  Service: Cardiovascular;  Laterality: N/A;   LIGATION ARTERIOVENOUS GORTEX GRAFT Right 04/20/2024   Procedure: LIGATION ARTERIOVENOUS GORTEX GRAFT;  Surgeon: Marea Selinda RAMAN, MD;  Location: [facility];  Service: Vascular;  Laterality: Right;   LOWER EXTREMITY ANGIOGRAPHY Left 04/24/2024   Procedure: Lower Extremity Angiography;  Surgeon: Marea Selinda RAMAN, MD;  Location: [facility];  Service: Cardiovascular;  Laterality: Left;   ultrasound guided pericardiocentesis     UPPER EXTREMITY ANGIOGRAPHY Right 03/30/2024   Procedure: Upper Extremity Angiography;  Surgeon: Marea Selinda RAMAN, MD;  Location: [facility];  Service: Cardiovascular;  Laterality: Right;     Social History:   reports that she has been smoking cigarettes. She has a 5 pack-year smoking history. She has never used smokeless tobacco. She reports that she does not drink alcohol and does not use drugs.   Family History:  Her family history includes Breast cancer  in her maternal aunt and maternal grandmother; Cancer in an other family member; Diabetes type II in her maternal grandmother and mother; Hypertension in her father, mother, sister, and another family member; Kidney disease in her father; Ovarian cancer in her mother; Renal Disease in an other family member.   Allergies Allergies[1]   Home Medications  Prior to Admission medications  Medication Sig Start Date End Date Taking? Authorizing Provider  albuterol  (VENTOLIN  HFA) 108 (90 Base) MCG/ACT inhaler Inhale 1-2 puffs into the lungs every 6 (six) hours as needed for wheezing or shortness of breath. 01/04/24  Yes Orlean Alan HERO, FNP  apixaban  (ELIQUIS ) 5 MG TABS tablet Take 5 mg by mouth 2 (two) times daily. 09/11/19  Yes [provider]  clobetasol cream (TEMOVATE) 0.05 % Apply 1 Application topically daily. 06/16/24  Yes [provider]  hydrOXYzine  (ATARAX /VISTARIL ) 50 MG tablet Take 50 mg by mouth 2 (two) times daily.   Yes  [provider]  levothyroxine  (SYNTHROID ) 75 MCG tablet Take 75 mcg by mouth daily before breakfast.  11/02/18  Yes [provider]  lidocaine -prilocaine  (EMLA ) cream Apply 1 Application topically once. 01/22/21  Yes [provider]  [Paused] metoprolol  succinate (TOPROL -XL) 25 MG 24 hr tablet Take 1 tablet by mouth every morning. Wait to take this until your doctor or other care provider tells you to start again. 08/27/23 08/26/24 Yes [provider]  midodrine  (PROAMATINE ) 10 MG tablet Take 1 tablet (10 mg total) by mouth 3 (three) times daily. 06/27/24  Yes Ponnala, Shruthi, MD  oxyCODONE -acetaminophen  (PERCOCET) 5-325 MG tablet Take 1 tablet by mouth every 6 (six) hours as needed for severe pain (pain score 7-10). 06/27/24  Yes Ponnala, Shruthi, MD  pantoprazole  (PROTONIX ) 40 MG tablet Take 1 tablet (40 mg total) by mouth every morning. 06/27/24  Yes Ponnala, Shruthi, MD  pregabalin (LYRICA) 50 MG capsule Take 50 mg by mouth daily. Patient not taking: Reported on 06/23/2024    [provider]  ranolazine  (RANEXA ) 1000 MG SR tablet Take 1,000 mg by mouth 2 (two) times daily. Patient not taking: Reported on 06/23/2024 03/18/23   [provider]  rosuvastatin  (CRESTOR ) 20 MG tablet Take 20 mg by mouth at bedtime. Patient not taking: Reported on 06/23/2024 11/07/21   [provider]  sevelamer  carbonate (RENVELA ) 800 MG tablet Take 3,200 mg by mouth 3 (three) times daily with meals. Patient not taking: Reported on 06/23/2024    [provider]  venlafaxine  (EFFEXOR ) 37.5 MG tablet Take 37.5 mg by mouth 2 (two) times daily with a meal. Patient not taking: Reported on 06/23/2024    [provider]     The patient is critically ill due to toxic metabolic encephalopathy, respiratory failure, V. Tach arrest, ESRD on HD.  Critical care was necessary to treat or prevent imminent or life-threatening deterioration. I personally performed high  risk medication and infusion titration and management and titration, monitoring, and management of vasopressor/ionotrope infusion. Critical care time was spent by me on the following activities: development of a treatment plan with the patient and/or surrogate as well as nursing, discussions with consultants, evaluation of the patient's response to treatment, examination of the patient, obtaining a history from the patient or surrogate, ordering and performing treatments and interventions, ordering and review of laboratory studies, ordering and review of radiographic studies, review of telemetry data including pulse oximetry, re-evaluation of patient's condition and participation in multidisciplinary rounds.   I personally spent 31 minutes providing critical care  not including any separately billable procedures.   Belva November, MD [facility] 07/12/2024 2:14 PM             [1]  Allergies Allergen Reactions   Morphine  And Codeine Hives   Levaquin [Levofloxacin] Itching and Other (See Comments)    Severe itching; prickly sensation; severe left leg pain; immobility.   Enalapril  Other (See Comments)    hallucinations   Latex Rash   Tape Rash and Other (See Comments)   "

## 2024-07-12 NOTE — Progress Notes (Signed)
 Heart Failure Navigator Progress Note  Assessed for Heart & Vascular TOC clinic readiness.  Patient does not meet criteria due to current [facility] Cardiology patient.   Navigator will sign off at this time.  Roxy Horseman, RN, BSN [facility] Heart Failure Navigator Secure Chat Only

## 2024-07-12 NOTE — Evaluation (Signed)
 Physical Therapy Evaluation Patient Details Name: [name] MRN: [id number] DOB: January 28, 1961 Today's Date: 07/12/2024  History of Present Illness  64 y/o female presented to ED on 07/01/24 for weakness and fall. Recent admit 1/8-1/14 for fluid overload and cardiogenic shock with syncope and collapse. Cardiac arrest on 1/18. Intubated 1/18-1/26.  PMH: COPD, ESRD on HD, CHF, bipolar disorder, chronic hypotension on midodrine , prior CVA, PVD, CAD s/p CABG x3, Afib on Eliquis   Clinical Impression  Patient admitted with the above. PTA, patient lives alone and was independent with no AD. Patient resting in bed with R cervical torticollis positioning but awakens to voice. On 3L O2 with VSS throughout session. Patient grimacing with pain with attempts at repositioning neck and BLE (L>R due to L ankle wounds). Required totalA+2 for bed mobility and initially mod-maxA to maintain sitting balance but able to progress to CGA. Patient very soft spoken and answering questions with 1-2 word answers. Patient will benefit from skilled PT services during acute stay to address listed deficits. Patient will benefit from ongoing therapy at discharge to maximize functional independence and safety.         If plan is discharge home, recommend the following: Two people to help with walking and/or transfers;Two people to help with bathing/dressing/bathroom;Assistance with feeding;Assistance with cooking/housework;Help with stairs or ramp for entrance;Assist for transportation;Direct supervision/assist for medications management;Direct supervision/assist for financial management;Supervision due to cognitive status   Can travel by private vehicle   No    Equipment Recommendations Other (comment) (TBD)  Recommendations for Other Services       Functional Status Assessment Patient has had a recent decline in their functional status and demonstrates the ability to make significant improvements in function in a  reasonable and predictable amount of time.     Precautions / Restrictions Precautions Precautions: Fall Recall of Precautions/Restrictions: Impaired Precaution/Restrictions Comments: has wounds on left foot Restrictions Weight Bearing Restrictions Per Provider Order: No      Mobility  Bed Mobility Overal bed mobility: Needs Assistance Bed Mobility: Supine to Sit, Sit to Supine     Supine to sit: Total assist, +2 for physical assistance, +2 for safety/equipment Sit to supine: Total assist, +2 for physical assistance, +2 for safety/equipment        Transfers                        Ambulation/Gait                  Stairs            Wheelchair Mobility     Tilt Bed    Modified Rankin (Stroke Patients Only)       Balance Overall balance assessment: Needs assistance Sitting-balance support: Feet unsupported, Single extremity supported Sitting balance-Leahy Scale: Poor Sitting balance - Comments: initially mod-maxA progressing to CGA                                     Pertinent Vitals/Pain Pain Assessment Pain Assessment: CPOT Facial Expression: Tense Body Movements: Protection Muscle Tension: Tense, rigid Compliance with ventilator (intubated pts.): N/A Vocalization (extubated pts.): Sighing, moaning CPOT Total: 4 Pain Intervention(s): Monitored during session    Home Living Family/patient expects to be discharged to:: Private residence Living Arrangements: Alone Available Help at Discharge: Family;Available PRN/intermittently Type of Home: House Home Access: Stairs to enter;Ramped entrance Entrance Stairs-Rails: Right;Left;Can reach both  Entrance Stairs-Number of Steps: 2   Home Layout: One level Home Equipment: Agricultural Consultant (2 wheels);Rollator (4 wheels);Cane - quad;Wheelchair - manual Additional Comments: info is from chart review, will need to confirm    Prior Function Prior Level of Function :  Independent/Modified Independent;Needs assist;Patient poor historian/Family not available             Mobility Comments: amb with SPC/RW/ transfer to mwc for mobility depending on how she feels per chart- will need to confirm ADLs Comments: per chart, pt MOD I for ADLs, medical transport for HD, family assist with IADL, will need to confirm     Extremity/Trunk Assessment   Upper Extremity Assessment Upper Extremity Assessment: Generalized weakness    Lower Extremity Assessment Lower Extremity Assessment: Defer to PT evaluation LLE Deficits / Details: L foot pain due to wounds    Cervical / Trunk Assessment Cervical / Trunk Assessment: Other exceptions Cervical / Trunk Exceptions: Pt with lateral head tilt to the R with chin towards shoulder- R torticollis; unable to tolerate sustained stretch to midline  Communication   Communication Communication: Impaired Factors Affecting Communication: Difficulty expressing self;Reduced clarity of speech    Cognition Arousal: Alert Behavior During Therapy: Flat affect   PT - Cognitive impairments: Orientation, Awareness, Attention, Problem solving, Safety/Judgement   Orientation impairments: Person, Place                   PT - Cognition Comments: answers in 1-2 words. Very soft spoken. Follows one step commands inconsistently. Following commands: Impaired Following commands impaired: Follows one step commands with increased time, Follows one step commands inconsistently     Cueing Cueing Techniques: Verbal cues, Gestural cues, Tactile cues     General Comments General comments (skin integrity, edema, etc.): vss on    Exercises     Assessment/Plan    PT Assessment Patient needs continued PT services  PT Problem List Decreased strength;Decreased activity tolerance;Decreased balance;Decreased mobility;Decreased coordination;Decreased cognition;Decreased knowledge of use of DME;Decreased knowledge of  precautions;Decreased safety awareness;Cardiopulmonary status limiting activity;Decreased skin integrity;Pain       PT Treatment Interventions DME instruction;Gait training;Stair training;Therapeutic activities;Functional mobility training;Therapeutic exercise;Patient/family education;Neuromuscular re-education    PT Goals (Current goals can be found in the Care Plan section)  Acute Rehab PT Goals Patient Stated Goal: did not state PT Goal Formulation: Patient unable to participate in goal setting Time For Goal Achievement: 07/26/24 Potential to Achieve Goals: Fair    Frequency Min 2X/week     Co-evaluation PT/OT/SLP Co-Evaluation/Treatment: Yes Reason for Co-Treatment: For patient/therapist safety;To address functional/ADL transfers PT goals addressed during session: Mobility/safety with mobility;Balance OT goals addressed during session: ADL's and self-care       AM-PAC PT 6 Clicks Mobility  Outcome Measure Help needed turning from your back to your side while in a flat bed without using bedrails?: Total Help needed moving from lying on your back to sitting on the side of a flat bed without using bedrails?: Total Help needed moving to and from a bed to a chair (including a wheelchair)?: Total Help needed standing up from a chair using your arms (e.g., wheelchair or bedside chair)?: Total Help needed to walk in hospital room?: Total Help needed climbing 3-5 steps with a railing? : Total 6 Click Score: 6    End of Session Equipment Utilized During Treatment: Oxygen  Activity Tolerance: Patient limited by pain Patient left: in bed;with call bell/phone within reach;with bed alarm set Nurse Communication: Mobility status;Other (comment) (neck positioning with  pillow and wedge) PT Visit Diagnosis: Other abnormalities of gait and mobility (R26.89);Pain;Difficulty in walking, not elsewhere classified (R26.2) Pain - Right/Left: Left Pain - part of body: Ankle and joints of foot  (and neck)    Time: 8652-8584 PT Time Calculation (min) (ACUTE ONLY): 28 min   Charges:   PT Evaluation $PT Eval High Complexity: 1 High   PT General Charges $$ ACUTE PT VISIT: 1 Visit         Maryanne Finder, PT, DPT Physical Therapist - [facility]  [facility]   Ildefonso Keaney A Adriannah Steinkamp 07/12/2024, 4:07 PM

## 2024-07-13 LAB — BASIC METABOLIC PANEL WITH GFR
Anion gap: 16 — ABNORMAL HIGH (ref 5–15)
BUN: 63 mg/dL — ABNORMAL HIGH (ref 8–23)
CO2: 24 mmol/L (ref 22–32)
Calcium: 10.3 mg/dL (ref 8.9–10.3)
Chloride: 89 mmol/L — ABNORMAL LOW (ref 98–111)
Creatinine, Ser: 3.93 mg/dL — ABNORMAL HIGH (ref 0.44–1.00)
GFR, Estimated: 12 mL/min — ABNORMAL LOW
Glucose, Bld: 104 mg/dL — ABNORMAL HIGH (ref 70–99)
Potassium: 4 mmol/L (ref 3.5–5.1)
Sodium: 130 mmol/L — ABNORMAL LOW (ref 135–145)

## 2024-07-13 LAB — MAGNESIUM: Magnesium: 2.2 mg/dL (ref 1.7–2.4)

## 2024-07-13 LAB — CBC
HCT: 30.7 % — ABNORMAL LOW (ref 36.0–46.0)
Hemoglobin: 9.8 g/dL — ABNORMAL LOW (ref 12.0–15.0)
MCH: 30.3 pg (ref 26.0–34.0)
MCHC: 31.9 g/dL (ref 30.0–36.0)
MCV: 95 fL (ref 80.0–100.0)
Platelets: 136 10*3/uL — ABNORMAL LOW (ref 150–400)
RBC: 3.23 MIL/uL — ABNORMAL LOW (ref 3.87–5.11)
RDW: 22 % — ABNORMAL HIGH (ref 11.5–15.5)
WBC: 12 10*3/uL — ABNORMAL HIGH (ref 4.0–10.5)
nRBC: 0.3 % — ABNORMAL HIGH (ref 0.0–0.2)

## 2024-07-13 LAB — GLUCOSE, CAPILLARY
Glucose-Capillary: 96 mg/dL (ref 70–99)
Glucose-Capillary: 99 mg/dL (ref 70–99)
Glucose-Capillary: 84 mg/dL (ref 70–99)
Glucose-Capillary: 105 mg/dL — ABNORMAL HIGH (ref 70–99)
Glucose-Capillary: 94 mg/dL (ref 70–99)

## 2024-07-13 LAB — PHOSPHORUS: Phosphorus: 4.9 mg/dL — ABNORMAL HIGH (ref 2.5–4.6)

## 2024-07-13 LAB — HEPARIN LEVEL (UNFRACTIONATED): Heparin Unfractionated: 0.33 [IU]/mL (ref 0.30–0.70)

## 2024-07-13 MED ORDER — HEPARIN SODIUM (PORCINE) 1000 UNIT/ML DIALYSIS: 1000.0000 [IU] | INTRAMUSCULAR | Status: DC | PRN

## 2024-07-13 MED ORDER — OSMOLITE 1.5 CAL PO LIQD
1000.0000 mL | ORAL | Status: DC
  Administered 2024-07-13: 1000 mL

## 2024-07-13 MED ORDER — ALTEPLASE 2 MG IJ SOLR: 2.0000 mg | Freq: Once | INTRAMUSCULAR | Status: DC | PRN

## 2024-07-13 MED ORDER — PENTAFLUOROPROP-TETRAFLUOROETH EX AERO: 1.0000 | INHALATION_SPRAY | CUTANEOUS | Status: DC | PRN

## 2024-07-13 MED ORDER — VANCOMYCIN HCL 750 MG/150ML IV SOLN
750.0000 mg | Freq: Once | INTRAVENOUS | Status: AC
  Administered 2024-07-13: 750 mg via INTRAVENOUS
  Filled 2024-07-13: qty 150

## 2024-07-13 MED ORDER — ANTICOAGULANT SODIUM CITRATE 4% (200MG/5ML) IV SOLN: 5.0000 mL | Status: DC | PRN

## 2024-07-13 MED ORDER — LIDOCAINE HCL (PF) 1 % IJ SOLN
5.0000 mL | INTRAMUSCULAR | Status: DC | PRN
  Filled 2024-07-13: qty 5

## 2024-07-13 MED ORDER — LIDOCAINE-PRILOCAINE 2.5-2.5 % EX CREA
1.0000 | TOPICAL_CREAM | CUTANEOUS | Status: DC | PRN
  Filled 2024-07-13: qty 5

## 2024-07-13 NOTE — Progress Notes (Signed)
 Report called to [facility] on 2a.
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
## 2024-07-13 NOTE — Progress Notes (Signed)
" °   07/13/24 1412  Vitals  Temp 98.2 F (36.8 C)  Temp Source Axillary  BP (!) 99/59  MAP (mmHg) 71  BP Location Right Leg  BP Method Automatic  Patient Position (if appropriate) Lying  Pulse Rate (!) 50  Pulse Rate Source Monitor  ECG Heart Rate (!) 50  Resp 17  Oxygen  Therapy  SpO2 99 %  O2 Device Nasal Cannula  O2 Flow Rate (L/min) 3 L/min  During Treatment Monitoring  Blood Flow Rate (mL/min) 0 mL/min  Arterial Pressure (mmHg) -3.03 mmHg  Venous Pressure (mmHg) -1.61 mmHg  TMP (mmHg) -51.31 mmHg  Ultrafiltration Rate (mL/min) 0 mL/min  Dialysate Flow Rate (mL/min) 300 ml/min  Duration of HD Treatment -hour(s) 3 hour(s)  Cumulative Fluid Removed (mL) per Treatment  -18.59  HD Safety Checks Performed Yes  Intra-Hemodialysis Comments Tolerated well;Tx completed  Dialysis Fluid Bolus Normal Saline  Bolus Amount (mL) 300 mL  Post Treatment  Dialyzer Clearance Lightly streaked  Hemodialysis Intake (mL) 0 mL  Liters Processed 72  Fluid Removed (mL) 0 mL  Tolerated HD Treatment No (Comment)  Post-Hemodialysis Comments no fluid removal no s/s of distress to note  Note  Patient Observations alert no problems to note  Hemodialysis Catheter Right Femoral vein Triple lumen Temporary (Non-Tunneled)  Placement Date/Time: 07/11/24 1249   Placed prior to admission: No  Ultrasound Used?: Yes  Person Inserting LDA: Dr Isadora  Orientation: Right  Access Location: Femoral vein  Hemodialysis Catheter Type: Triple lumen Temporary (Non-Tunneled)  Site Condition No complications  Blue Lumen Status Heparin  locked  Red Lumen Status Heparin  locked  Purple Lumen Status Infusing  Catheter fill solution Heparin  1000 units/ml  Catheter fill volume (Arterial) 1.4 cc  Catheter fill volume (Venous) 1.4  Dressing Type Transparent  Dressing Status Antimicrobial disc/dressing in place  Drainage Description None  Dressing Change Due 07/19/24  Post treatment catheter status Capped and Clamped    Received patient in bed to unit.  Alert and oriented.  Informed consent signed and in chart.   TX duration:3   Patient tolerated well.  bedside Alert, without acute distress.  Hand-off given to patient's nurse.   Access used: rt femoral catheter Access issues: none  Total UF removed: 0 Medication(s) given: sodium thiodulfate Post HD VS: see above Post HD weight: n/a   Docia CHRISTELLA [facility] "

## 2024-07-13 NOTE — TOC Progression Note (Signed)
 Transition of Care [facility]) - Progression Note    Patient Details  Name: [name] MRN: [id number] Date of Birth: 04-22-1961  [facility] of [facility] CM/SW Contact  Corrie JINNY Ruts, LCSW Phone Number: 07/13/2024, 2:09 PM  Clinical Narrative:    Chart reviewed. Patient is not fully oriented to talk about new SNF recommendation. Will follow up with the patient based on medical progression.   Expected Discharge Plan: Home w Home Health Services Barriers to Discharge: Continued Medical Work up               Expected Discharge Plan and Services       Living arrangements for the past 2 months: Single Family Home                           HH Arranged: RN, PT, OT [facility] Agency: [facility] Date [facility] Agency Contacted: 07/06/24   Representative spoke with at [facility] Agency: [place]   Social Drivers of Health (SDOH) Interventions SDOH Screenings   Food Insecurity: No Food Insecurity (07/02/2024)  Housing: Low Risk (07/02/2024)  Transportation Needs: No Transportation Needs (07/02/2024)  Utilities: Not At Risk (07/02/2024)  Depression (PHQ2-9): Low Risk (07/06/2023)  Financial Resource Strain: Low Risk  (05/31/2024)   Received from [facility]  Tobacco Use: High Risk (07/01/2024)    Readmission Risk Interventions    07/06/2024   11:55 AM 06/26/2024   12:20 PM 12/23/2021   11:41 AM  Readmission Risk Prevention Plan  Transportation Screening Complete Complete Complete  PCP or Specialist Appt within 3-5 Days  Complete   HRI or Home Care Consult  Complete Complete  Social Work Consult for Recovery Care Planning/Counseling  Complete Complete  Palliative Care Screening  Not Applicable Not Applicable  Medication Review Oceanographer) Complete Complete Complete  PCP or Specialist appointment within 3-5 days of discharge Complete    HRI or Home Care Consult Complete    SW Recovery Care/Counseling Consult Complete    Palliative Care Screening Not  Applicable    Skilled Nursing Facility Not Applicable

## 2024-07-13 NOTE — Progress Notes (Signed)
 PHARMACY - ANTICOAGULATION CONSULT NOTE  Pharmacy Consult for UFH infusion Indication: atrial fibrillation  Allergies[1]  Patient Measurements: Height: 5' 6 (167.6 cm) Weight: 79.9 kg (176 lb 2.4 oz) IBW/kg (Calculated) : 59.3 HEPARIN  DW (KG): 70.5  Labs: Recent Labs    07/11/24 0425 07/12/24 0411 07/12/24 0759 07/13/24 0437  HGB 10.2* 9.9*  --  9.8*  HCT 30.4* 30.1*  --  30.7*  PLT 117* 134*  --  136*  HEPARINUNFRC 0.43  --  0.48 0.33  CREATININE 4.86* 3.22*  --   --     Estimated Creatinine Clearance: 19.6 mL/min (A) (by C-G formula based on SCr of 3.22 mg/dL (H)).   Medical History: Past Medical History:  Diagnosis Date   Acute lacunar infarction (HCC) 03/03/2014   a.) MRI brain 03/03/2014: two acute punctate lacunar infarcts anteriorly in the BILATERAL centrum semiovale   Adult behavior problems    Anemia of chronic renal failure    Aortic atherosclerosis    Arthritis    Atrial fibrillation and flutter (HCC)    a.) CHA2DS2VASc = 6 (sex, HFrEF, HTN, CVA x 2, prior MI/vascular disease) as of 02/04/2024; b.) rate/rhythm maintained on oral metoprolol  succinate; chronically anticoagulated with apixaban    Bipolar disorder (HCC)    Cardiomegaly    Cerebral microvascular disease    CHF (congestive heart failure) (HCC)    Chicken pox    Cholelithiasis    Chronic respiratory failure with hypoxia (HCC) 12/19/2021   COPD (chronic obstructive pulmonary disease) (HCC)    Coronary artery disease    DDD (degenerative disc disease), cervical    DDD (degenerative disc disease), thoracolumbar    Diverticulosis    Dyspnea    ESRD (end stage renal disease) on dialysis (M,W,F)    GERD (gastroesophageal reflux disease)    Gout    HCV (hepatitis C virus)    Headache    HFrEF (heart failure with reduced ejection fraction) (HCC)    History of delirium 04/12/2014   History of substance abuse (HCC)    a.) tobacco + cocaine   Hyperkalemia 11/13/2014   Hyperlipidemia     Hypertension    Hypotension 02/24/2014   Hypothyroidism    Ischemic cardiomyopathy    Nose colonized with MRSA 02/01/2024   a.) presurgical PCR (+) on 02/01/2024 prior to A/V FISTULAGRAM; b.) presurgical PCR (+) on 03/07/2024 prior to AV FISTULA CREATION   NSTEMI (non-ST elevated myocardial infarction) (HCC) 10/08/2013   NSTEMI (non-ST elevated myocardial infarction) (HCC) 01/23/2002   a.) LHC 01/24/2002 --> multi-vessel CAD with IRA being 90% OM2 --> delayed PCI until 01/25/2002 --> 3.0 x 23 mm BX Velocity stent   Obesity    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    On apixaban  therapy    Peripheral vascular disease    Pneumonia    Postoperative anemia due to acute blood loss 02/27/2014   Postoperative cerebrovascular infarction following cardiac surgery 02/21/2014   a.) developed RIGHT upper/lower extremity weakness following cardiac revascularization --> neuro consult and head CT --> Hypodensity noted in the area of the LEFT motor cortex consistent with a component of acute ischemia   Pulmonary hypertension (HCC)    RBBB (right bundle branch block)    Renal osteodystrophy    S/P CABG x 3 02/20/2014   a.) LIMA-LAD, SVG-OM1, SVG-PDA   Stroke (HCC) 05/23/2010   a.) brain MRI 05/23/2010: BILATERAL acute infarcts and old prior infarcts; RIGHT frontal lobe extending into the white matter. Small focus of restricted  diffusion in the cortex of the medial LEFT frontoparietal lobe. Periventricular T2 and multiple foci of T2 hyperintensity in the subcortical white matter and increased FLAIR signal in the cortex of the LEFT frontal lobe.   Syncope and collapse 07/25/2020   Assessment: 64 y/o F with a h/o atrial fibrillation on apixaban . Last dose was 1/18 at 0900. Patient coded on 07-09-24 with subsequent ROSC and is now in the ICU on ventilation and pressor support. Pharmacy consulted for IV heparin .   Goal of Therapy:  anti-Xa level  0.3-0.7 units/ml Monitor platelets by anticoagulation  protocol: Yes   Plan:  1/29:  HL @ 0437 = 0.33, therapeutic X 7  --Continue heparin  infusion at 1450 units/hr --Re-check HL daily w/ AM labs while therapeutic --Daily CBC  Sundus Pete D 07/13/2024 5:20 AM     [1]  Allergies Allergen Reactions   Morphine  And Codeine Hives   Levaquin [Levofloxacin] Itching and Other (See Comments)    Severe itching; prickly sensation; severe left leg pain; immobility.   Enalapril  Other (See Comments)    hallucinations   Latex Rash   Tape Rash and Other (See Comments)

## 2024-07-13 NOTE — Progress Notes (Signed)
 SLP Cancellation Note  Patient Details Name: [name] MRN: [id number] DOB: Sep 16, 1960   Cancelled treatment:       Reason Eval/Treat Not Completed: Fatigue/lethargy limiting ability to participate. Pt resting in room, reclined, with dialysis unit running. Plan to hold bedside swallow evaluation until pt is alert and able to sit up. RN and MD aware of plan.   Vanessa Givhan Clapp, MS, CCC-SLP Speech Language Pathologist Rehab Services; [facility] [phone] (ascom)     [name] 07/13/2024, 11:57 AM

## 2024-07-13 NOTE — Progress Notes (Signed)
 " PROGRESS NOTE    [name]  FMW:[id number] DOB: 1961/01/03 DOA: 07/01/2024 PCP: Orlean Alan HERO, FNP  [id number]  LOS: 11 days   Brief hospital course:   Assessment & Plan: [name] is a 64 y.o. female with a pmh of of ESRD on HD (MWF), COPD, CHF, bipolar disorder, chronic hypotension on midodrine , HLD and prior CVA who presented to [facility] ED via EMS following syncopal episode.   07/02/24-transferred to ICU post CODE BLUE on medical floor status post ACLS with ROSC  07/03/24- overnight, unresposive with AF. MWF(HD). Will meet with family. Will continue to monitor and re-evaluate post dialysis.  07/04/24- patient had few episodes of VT. She is DNR code status.  She was seen by cardiology, she has poor prognosis. No procedures are planned.  07/05/24- patient had hypotensive episode during HD so it was interuppted.  The lidocaine  infusion will be dcd today and amiodarone  will continue.  Plan for paracentesis today. 07/06/24- patient with no events overnight, did have a non sustained run of VT similar to previous.  Performing awakening trial today.  Due to staph/strep bacteremia the tunnelled vascCath has to come out.  07/07/24 - started PO amiodarone , MRI brain unremarkable 07/08/24 - more awake and interactive 07/09/24 - intubated and off sedation, failed WUA 07/10/24 - more awake, following commands, extubated 07/11/24 - HD catheter placed, triple lumen removed from left groin 07/12/24 - off vasopressin  07/13/24 transferred to [facility]   #Cardiac Arrest  #Ventricular Fibrillation --cardio consulted --cont amiodarone   #CAD  #Ischemic Cardiomyopathy  Hypotension --cont midodrine   #Septic Shock 2/2 Strep gordonii bacteremia- MRSA bacteremia --HD cath removed  Repeat blood cultrue X 2 NG 2 d echo no vegetations valves --cont IV vanc, will give for 4 weeks total 07/31/24 during dialysis  #Afib on Eliquis   Acute metabolic encephalopathy  Likely hospital delirium --extubated  with improvement in mental status, but not quite back to her baseline. MRI brain without acute pathology.   Chronic combined CHF --LVEF 25% and a dysfunctional and enlarged right ventricle.  --management per cardio   Acute respiratory failure  --in the setting of cardiac arrest, now extubated as of 07/10/24 and currently on nasal cannula.   Ascites  s/p paracentesis- possible cirrhosis could be cardiac cirrhosis Cell coulnt 605 ( 44% N) Protein not sent Culture neg   ESRD on HD  --had a tunneled dialysis catheter. With concern for bacteremia this was explanted. Placed new HD catheter in the right groin, with plan for tunneled catheter placement later in the week with vascular surgery.  --iHD per nephro     DVT prophylaxis: On:heparin  gtt Code Status: DNR  Family Communication:  Level of care: Progressive Dispo:   The patient is from: home Anticipated d/c is to: [facility] rehab Anticipated d/c date is: after PermCath placed   Subjective and Interval History:  When asked what was bothering her, pt said everything bothered her.  Pt perked up when discussing when to resume oral intake.     Objective: Vitals:   07/13/24 1330 07/13/24 1400 07/13/24 1412 07/13/24 1605  BP: (!) 91/48 (!) 92/48 (!) 99/59 (!) 108/57  Pulse: (!) 54 (!) 50 (!) 50 (!) 59  Resp: 18 17 17 19   Temp:   98.2 F (36.8 C) (!) 97 F (36.1 C)  TempSrc:   Axillary Oral  SpO2: 98% 98% 99% 97%  Weight:      Height:        Intake/Output Summary (Last 24 hours) at  07/13/2024 1911 Last data filed at 07/13/2024 1557 Gross per 24 hour  Intake 588.64 ml  Output 0 ml  Net 588.64 ml   Filed Weights   07/12/24 0500 07/13/24 0450 07/13/24 1044  Weight: 75.9 kg 79.9 kg 79.9 kg    Examination:   Constitutional: NAD, sleepy but arousable HEENT: conjunctivae and lids normal, EOMI CV: No cyanosis.   RESP: normal respiratory effort, on 3L Extremities: edema in BUE SKIN: warm, dry   Data Reviewed: I have  personally reviewed labs and imaging studies  Time spent: 50 minutes  Ellouise Haber, MD [facility] If 7PM-7AM, please contact night-coverage 07/13/2024, 7:11 PM   "

## 2024-07-13 NOTE — Progress Notes (Signed)
 [facility] CLINIC CARDIOLOGY PROGRESS NOTE   Patient ID: [name] MRN: [id number] DOB/AGE: 62-Apr-1962 62 y.o.  Admit date: 07/01/2024 Referring Physician Dr. Devaughn Ban  Primary Physician Orlean Alan HERO, FNP  Primary Cardiologist Dr. Denyse Bathe Reason for Consultation syncope  HPI: [name] is a 64 y.o. female with a past medical history of chronic HFrEF, coronary artery disease, atrial fibrillation on eliquis , COPD, ESRD on HD, bipolar disorder, hypotension on midodrine , hx CVA  who presented to the ED on 07/01/2024 for syncopal event at home. Cardiology was consulted for further evaluation.   Interval History:  -Patient seen and examined this AM.  States that she does not feel well today but cannot elaborate further.  Denies any pain. - Sinus bradycardia on tele with occasional PVCs.  Overall. - Remains off of pressors.  BP remains soft.  Review of systems complete and found to be negative unless listed above   Vitals:   07/13/24 0450 07/13/24 0500 07/13/24 0600 07/13/24 0700  BP:  (!) 91/54 (!) 90/53 (!) 95/51  Pulse:  (!) 56 (!) 48 (!) 52  Resp:  17 17 18   Temp:      TempSrc:      SpO2:  98% 98% 98%  Weight: 79.9 kg     Height:         Intake/Output Summary (Last 24 hours) at 07/13/2024 0845 Last data filed at 07/13/2024 0748 Gross per 24 hour  Intake 1107.65 ml  Output --  Net 1107.65 ml     PHYSICAL EXAM General: Ill appearing female, no acute distress.  HEENT: Normocephalic and atraumatic. Neck: No JVD.  Lungs: Diminished bilaterally.  Heart: HRRR. Normal S1 and S2 without gallops or murmurs. Radial & DP pulses 2+ bilaterally. Abdomen: Non-distended appearing.  Msk: Normal strength and tone for age. Extremities: No clubbing, cyanosis or edema.     LABS: Basic Metabolic Panel: Recent Labs    07/12/24 0411 07/13/24 0437  NA 130* 130*  K 3.8 4.0  CL 89* 89*  CO2 25 24  GLUCOSE 80 104*  BUN 51* 63*  CREATININE 3.22* 3.93*  CALCIUM   10.0 10.3  MG 2.2 2.2  PHOS 4.0 4.9*   Liver Function Tests: No results for input(s): AST, ALT, ALKPHOS, BILITOT, PROT, ALBUMIN  in the last 72 hours.  No results for input(s): LIPASE, AMYLASE in the last 72 hours. CBC: Recent Labs    07/12/24 0411 07/13/24 0437  WBC 12.7* 12.0*  HGB 9.9* 9.8*  HCT 30.1* 30.7*  MCV 91.5 95.0  PLT 134* 136*   Cardiac Enzymes: No results for input(s): CKTOTAL, CKMB, CKMBINDEX, TROPONINIHS in the last 72 hours. BNP: No results for input(s): BNP in the last 72 hours. D-Dimer: No results for input(s): DDIMER in the last 72 hours. Hemoglobin A1C: No results for input(s): HGBA1C in the last 72 hours. Fasting Lipid Panel: No results for input(s): CHOL, HDL, LDLCALC, TRIG, CHOLHDL, LDLDIRECT in the last 72 hours.  Thyroid  Function Tests: No results for input(s): TSH, T4TOTAL, T3FREE, THYROIDAB in the last 72 hours.  Invalid input(s): FREET3 Anemia Panel: No results for input(s): VITAMINB12, FOLATE, FERRITIN, TIBC, IRON, RETICCTPCT in the last 72 hours.  ECHOCARDIOGRAM COMPLETE Result Date: 07/11/2024    ECHOCARDIOGRAM REPORT   Patient Name:   [name] Date of Exam: 07/11/2024 Medical Rec #:  [id number]         Height:       66.0 in Accession #:    [id number]  Weight:       175.0 lb Date of Birth:  12-06-1960          BSA:          1.890 m Patient Age:    64 years          BP:           107/74 mmHg Patient Gender: F                 HR:           61 bpm. Exam Location:  [facility] Procedure: 2D Echo, Cardiac Doppler and Color Doppler (Both Spectral and Color            Flow Doppler were utilized during procedure). Indications:     Bacteremia  History:         Patient has prior history of Echocardiogram examinations. Prior                  CABG; Signs/Symptoms:Chest Pain and Bacteremia.  Sonographer:     Rainelle Gull Referring Phys:  [id number] DONALD BERLIN Diagnosing Phys:  Lonni Hanson MD  Sonographer Comments: Suboptimal apical window. IMPRESSIONS  1. Left ventricular ejection fraction, by estimation, is 25 to 30%. The left ventricle has severely decreased function. The left ventricle demonstrates regional wall motion abnormalities (see scoring diagram/findings for description). The left ventricular internal cavity size was severely dilated. Left ventricular diastolic parameters are indeterminate. There is moderate hypokinesis of the left ventricular, entire septal wall and anterior wall. There is akinesis of the left ventricular, entire  inferior wall and inferolateral wall.  2. Right ventricular systolic function is severely reduced. The right ventricular size is moderately enlarged. There is severely elevated pulmonary artery systolic pressure. The estimated right ventricular systolic pressure is 94.6 mmHg.  3. Left atrial size was severely dilated.  4. Right atrial size was severely dilated.  5. The mitral valve is normal in structure. Mild mitral valve regurgitation. No evidence of mitral stenosis.  6. Tricuspid valve regurgitation is severe.  7. The aortic valve is tricuspid. There is mild thickening of the aortic valve. Aortic valve regurgitation is not visualized. Mild to moderate aortic valve stenosis.  8. The inferior vena cava is dilated in size with <50% respiratory variability, suggesting right atrial pressure of 15 mmHg. Conclusion(s)/Recommendation(s): No evidence of valvular vegetations on this transthoracic echocardiogram. Consider a transesophageal echocardiogram to exclude infective endocarditis if clinically indicated. FINDINGS  Left Ventricle: Left ventricular ejection fraction, by estimation, is 25 to 30%. The left ventricle has severely decreased function. The left ventricle demonstrates regional wall motion abnormalities. Moderate hypokinesis of the left ventricular, entire  septal wall and anterior wall. The left ventricular internal cavity size was  severely dilated. There is no left ventricular hypertrophy. Left ventricular diastolic parameters are indeterminate. Right Ventricle: The right ventricular size is moderately enlarged. No increase in right ventricular wall thickness. Right ventricular systolic function is severely reduced. There is severely elevated pulmonary artery systolic pressure. The tricuspid regurgitant velocity is 4.46 m/s, and with an assumed right atrial pressure of 15 mmHg, the estimated right ventricular systolic pressure is 94.6 mmHg. Left Atrium: Left atrial size was severely dilated. Right Atrium: Right atrial size was severely dilated. Pericardium: There is no evidence of pericardial effusion. Mitral Valve: The mitral valve is normal in structure. Mild mitral valve regurgitation. No evidence of mitral valve stenosis. Tricuspid Valve: The tricuspid valve is normal in structure. Tricuspid valve regurgitation is severe. Aortic Valve:  The aortic valve is tricuspid. There is mild thickening of the aortic valve. Aortic valve regurgitation is not visualized. Mild to moderate aortic stenosis is present. Aortic valve mean gradient measures 6.0 mmHg. Aortic valve peak gradient  measures 12.0 mmHg. Aortic valve area, by VTI measures 1.56 cm. Pulmonic Valve: The pulmonic valve was normal in structure. Pulmonic valve regurgitation is trivial. No evidence of pulmonic stenosis. Aorta: The aortic root is normal in size and structure. Pulmonary Artery: The pulmonary artery is of normal size. Venous: The inferior vena cava is dilated in size with less than 50% respiratory variability, suggesting right atrial pressure of 15 mmHg. IAS/Shunts: The interatrial septum was not well visualized.  LEFT VENTRICLE PLAX 2D LVIDd:         7.30 cm LVIDs:         6.40 cm LV PW:         0.90 cm LV IVS:        0.90 cm LVOT diam:     2.10 cm LV SV:         54 LV SV Index:   29 LVOT Area:     3.46 cm  LV Volumes (MOD) LV vol d, MOD A2C: 242.0 ml LV vol d, MOD A4C:  186.0 ml LV vol s, MOD A2C: 176.0 ml LV vol s, MOD A4C: 126.0 ml LV SV MOD A2C:     66.0 ml LV SV MOD A4C:     186.0 ml LV SV MOD BP:      68.2 ml RIGHT VENTRICLE            IVC RV Basal diam:  6.00 cm    IVC diam: 2.30 cm RV Mid diam:    4.80 cm RV S prime:     6.20 cm/s TAPSE (M-mode): 1.3 cm LEFT ATRIUM              Index        RIGHT ATRIUM           Index LA Vol (A2C):   126.0 ml 66.68 ml/m  RA Area:     30.50 cm LA Vol (A4C):   114.0 ml 60.33 ml/m  RA Volume:   107.00 ml 56.62 ml/m LA Biplane Vol: 120.0 ml 63.50 ml/m  AORTIC VALVE                     PULMONIC VALVE AV Area (Vmax):    1.67 cm      PV Vmax:          0.91 m/s AV Area (Vmean):   1.60 cm      PV Vmean:         61.200 cm/s AV Area (VTI):     1.56 cm      PV VTI:           0.226 m AV Vmax:           173.00 cm/s   PV Peak grad:     3.3 mmHg AV Vmean:          111.000 cm/s  PV Mean grad:     2.0 mmHg AV VTI:            0.346 m       PR End Diast Vel: 5.64 msec AV Peak Grad:      12.0 mmHg AV Mean Grad:      6.0 mmHg LVOT Vmax:         83.60  cm/s LVOT Vmean:        51.200 cm/s LVOT VTI:          0.156 m LVOT/AV VTI ratio: 0.45  AORTA Ao Root diam: 2.80 cm Ao Asc diam:  2.40 cm TRICUSPID VALVE TR Peak grad:   79.6 mmHg TR Vmax:        446.00 cm/s  SHUNTS Systemic VTI:  0.16 m Systemic Diam: 2.10 cm Lonni Hanson MD Electronically signed by Lonni Hanson MD Signature Date/Time: 07/11/2024/5:30:36 PM    Final       ECHO 06/23/2024: 1. Left ventricular ejection fraction, by estimation, is 25 to 30%. The left ventricle has severely decreased function. The left ventricle demonstrates global hypokinesis. The left ventricular internal cavity size was severely dilated. Left ventricular diastolic function could not be evaluated.   2. Right ventricular systolic function is severely reduced. The right  ventricular size is severely enlarged. Mildly increased right ventricular  wall thickness.   3. Left atrial size was mildly dilated.   4. Right  atrial size was severely dilated.   5. The mitral valve is normal in structure. Mild mitral valve regurgitation.   6. Tricuspid valve regurgitation is severe.   7. The aortic valve is grossly normal. Aortic valve regurgitation is not visualized. Aortic valve sclerosis is present, with no evidence of aortic  valve stenosis.   TELEMETRY (personally reviewed): Sinus rhythm rate 50s, PVCs  EKG (personally reviewed): AF RBBB rate 167 bpm  DATA reviewed by me 07/13/24: last 24h vitals tele labs imaging I/O, PCCM progress note  Principal Problem:   Syncope and collapse Active Problems:   ESRD on hemodialysis (HCC)   Chronic hypotension   S/P CABG x 3   Mild dementia associated with other underlying disease, without behavioral disturbance, psychotic disturbance, mood disturbance, or anxiety (HCC)   Anemia of chronic disease   ESRD (end stage renal disease) (HCC)   Paroxysmal atrial fibrillation (HCC)   Hypothyroidism   Chronic combined systolic and diastolic CHF (congestive heart failure) (HCC)   Hepatitis C antibody positive in blood   Other cirrhosis of liver (HCC)   Hypertension   Bipolar disorder in full remission, most recent episode unspecified type   Chronic pain syndrome   Other secondary kyphosis, cervical region   Cervical spondylosis   Fall   Acute pulmonary edema (HCC)   Bacteremia due to Streptococcus   MRSA bacteremia   ESRD (end stage renal disease) on dialysis (HCC)   Acute on chronic respiratory failure with hypoxia (HCC)   Acute on chronic HFrEF (heart failure with reduced ejection fraction) (HCC)   Cardiac arrest with ventricular fibrillation (HCC)   Toxic metabolic encephalopathy   Pressure injury of skin    ASSESSMENT AND PLAN: [name] is a 64 y.o. female with a past medical history of chronic HFrEF, coronary artery disease, atrial fibrillation on eliquis , COPD, ESRD on HD, bipolar disorder, hypotension on midodrine , hx CVA  who presented to the ED  on 07/01/2024 for syncopal event at home. Cardiology was consulted for further evaluation.   # Atrial fibrillation RVR # Paroxysmal atrial fibrillation # Syncope/ ?cardiac arrest # Chronic HFrEF # Coronary artery disease Patient brought to ED after being found down at home by family. Initially was admitted to the floor but on 07/02/24 she became unresponsive and was tachycardia, concern for wide complex tachycardia but has RBBB at baseline. Underwent ACLS, received 1 shock and 2 doses of epi with ROSC. Started on IV amiodarone .  Lidocaine  started 07/04/2024 by EP.  Overnight on 07/05/2024 developed bradycardia and thus IV amiodarone  was discontinued. 07/05/24 EP discontinued lidocaine  and restarted amiodarone .  -EP signed off, appreciate their assistance. Given bacteremia and encephalopathy would not be candidate for device at this time.  -Continue IV heparin .  Recommend transition to Eliquis  if no further procedures are planned. -Continue PO amiodarone  loading. -Further management per primary team, appreciate their assistance. No plan for invasive cardiac procedures at this time. Discussed with patient's daughter 07/11/24 that overall prognosis is poor, daughter expressed that family agrees with planning for medical management with no procedures.  -Agree with palliative care evaluation for continued goals of care conversations.  This patient's case was discussed and created with Dr. Wilburn and he is in agreement.  Signed:  Danita Bloch, PA-C  07/13/2024, 8:45 AM [facility] Cardiology

## 2024-07-13 NOTE — Progress Notes (Signed)
 Patient admitted to PCU from ICU. Upon arrival, patient placed on continuous cardiac telemetry monitoring. Vital signs obtained and documented. Patient oriented to room, call bell system, and unit routines. Safety measures implemented: bed in lowest position, call bell in reach, and side rails up x3. Patient repositioned for comfort and skin protection. Initial head-to-toes assessment completed. Wounds assessed. No signs of infection. Buttock dressing reinforced. Right arm and left foot dressing changed.  Patient resting comfortably in bed, no acute distress noted at this time.

## 2024-07-13 NOTE — Progress Notes (Signed)
 Nutrition Follow Up Note   DOCUMENTATION CODES:   Not applicable  INTERVENTION:   RD will add supplements with diet advancement   Osmolite 1.5@60ml /hr- Initiate at 51ml/hr and increase by 10ml/hr q8 hours until goal rate is reached.   Free water  flushes 30ml q4 hours to maintain tube patency   Regimen provides 2160kcal/day, 90g/day protein and 1214ml/day of free water .   Rena-vit daily via tube   Juven Fruit Punch BID via tube, each serving provides 95kcal and 2.5g of protein (amino acids glutamine and arginine)  Pt remains at high refeed risk; recommend monitor potassium, magnesium  and phosphorus labs daily until stable  Daily weights   NUTRITION DIAGNOSIS:   Inadequate oral intake related to inability to eat (pt sedated and ventilated) as evidenced by NPO status. -ongoing   GOAL:   Patient will meet greater than or equal to 90% of their needs -not met   MONITOR:   Diet advancement, Labs, Weight trends, TF tolerance, Skin, I & O's  ASSESSMENT:   64 y/o female with h/o ESRD on HD, CAD s/p CABG, ischemic cardiomyopathy, dementia, CVA, PVD, PAF, COPD, HLD, hypothyroid disease, CHF, HCV, cirrhosis, HTN,  DDD, chronic pain, GERD, bipolar disorder, gout, diverticulosis and pulmonary hypertension who is admitted with cardiac arrest, bacteremia and septic shock.  Pt s/p HD this morning. Pt remains NPO; SLP is following. NGT remains in place and tube feeds were initiated yesterday at trickle rate. Will begin increasing towards goal rate once ok per MD. No more vomiting noted. Pt is having bowel function. Per chart, pt is up ~21lbs since admission. Pt +8.3L on her I & Os.   Medications reviewed and include: synthroid , midodrine , rena-vit, juven, heparin , sodium thiosulfate , vancomycin    Labs reviewed: Na 130(L), K 4.0 wnl, BUN 63(H), creat 3.93(H), P 4.9(H), Mg 2.2 wnl Wbc- 12.0(H), Hgb 9.8(L), Hct 30.7(L) Cbgs- 84, 99, 96 x 24 hrs   Diet Order:   Diet Order     None       EDUCATION NEEDS:   No education needs have been identified at this time  Skin:  Skin Assessment: Reviewed RN Assessment (ulcers L foot and heel, Stage II sacrum)  Last BM:  1/29- type 6  Height:   Ht Readings from Last 1 Encounters:  07/01/24 5' 6 (1.676 m)    Weight:   Wt Readings from Last 1 Encounters:  07/13/24 79.9 kg    Ideal Body Weight:  59 kg  BMI:  Body mass index is 28.43 kg/m.  Estimated Nutritional Needs:   Kcal:  1800-2100kcal/day  Protein:  90-105g/day  Fluid:  UOP +1L  Augustin Shams MS, RD, LDN If unable to be reached, please send secure chat to RD inpatient available from 8:00a-4:00p daily

## 2024-07-14 LAB — CBC
HCT: 31 % — ABNORMAL LOW (ref 36.0–46.0)
Hemoglobin: 9.8 g/dL — ABNORMAL LOW (ref 12.0–15.0)
MCH: 30.4 pg (ref 26.0–34.0)
MCHC: 31.6 g/dL (ref 30.0–36.0)
MCV: 96.3 fL (ref 80.0–100.0)
Platelets: 160 10*3/uL (ref 150–400)
RBC: 3.22 MIL/uL — ABNORMAL LOW (ref 3.87–5.11)
RDW: 22.1 % — ABNORMAL HIGH (ref 11.5–15.5)
WBC: 13.4 10*3/uL — ABNORMAL HIGH (ref 4.0–10.5)
nRBC: 0.4 % — ABNORMAL HIGH (ref 0.0–0.2)

## 2024-07-14 LAB — BASIC METABOLIC PANEL WITH GFR
Anion gap: 16 — ABNORMAL HIGH (ref 5–15)
BUN: 48 mg/dL — ABNORMAL HIGH (ref 8–23)
CO2: 26 mmol/L (ref 22–32)
Calcium: 9.9 mg/dL (ref 8.9–10.3)
Chloride: 92 mmol/L — ABNORMAL LOW (ref 98–111)
Creatinine, Ser: 3 mg/dL — ABNORMAL HIGH (ref 0.44–1.00)
GFR, Estimated: 17 mL/min — ABNORMAL LOW
Glucose, Bld: 97 mg/dL (ref 70–99)
Potassium: 3.8 mmol/L (ref 3.5–5.1)
Sodium: 133 mmol/L — ABNORMAL LOW (ref 135–145)

## 2024-07-14 LAB — GLUCOSE, CAPILLARY
Glucose-Capillary: 108 mg/dL — ABNORMAL HIGH (ref 70–99)
Glucose-Capillary: 77 mg/dL (ref 70–99)
Glucose-Capillary: 86 mg/dL (ref 70–99)
Glucose-Capillary: 89 mg/dL (ref 70–99)
Glucose-Capillary: 95 mg/dL (ref 70–99)

## 2024-07-14 LAB — PHOSPHORUS: Phosphorus: 3.3 mg/dL (ref 2.5–4.6)

## 2024-07-14 LAB — MAGNESIUM: Magnesium: 2.1 mg/dL (ref 1.7–2.4)

## 2024-07-14 LAB — HEPARIN LEVEL (UNFRACTIONATED): Heparin Unfractionated: 0.41 [IU]/mL (ref 0.30–0.70)

## 2024-07-14 MED ORDER — OSMOLITE 1.5 CAL PO LIQD
1260.0000 mL | ORAL | Status: DC
  Administered 2024-07-14 – 2024-07-17 (×2): 1260 mL

## 2024-07-14 MED ORDER — SODIUM THIOSULFATE 250 MG/ML IV SOLN: 25.0000 g | INTRAVENOUS | Status: DC

## 2024-07-14 MED ORDER — APIXABAN 5 MG PO TABS
5.0000 mg | ORAL_TABLET | Freq: Two times a day (BID) | ORAL | Status: DC
  Administered 2024-07-14 – 2024-07-20 (×14): 5 mg via ORAL
  Filled 2024-07-14 (×14): qty 1

## 2024-07-14 MED ORDER — HEPARIN SODIUM (PORCINE) 1000 UNIT/ML IJ SOLN
3000.0000 [IU] | Freq: Once | INTRAMUSCULAR | Status: AC
  Administered 2024-07-14: 3000 [IU]
  Filled 2024-07-14: qty 3

## 2024-07-14 MED ORDER — SODIUM THIOSULFATE 250 MG/ML IV SOLN
25.0000 g | Freq: Once | INTRAVENOUS | Status: DC
  Filled 2024-07-14: qty 100

## 2024-07-14 MED ORDER — VANCOMYCIN HCL 750 MG/150ML IV SOLN
750.0000 mg | INTRAVENOUS | Status: DC
  Filled 2024-07-14: qty 150

## 2024-07-14 MED ORDER — SODIUM CHLORIDE 0.9 % IV BOLUS
250.0000 mL | Freq: Once | INTRAVENOUS | Status: AC
  Administered 2024-07-14: 250 mL via INTRAVENOUS

## 2024-07-14 MED ORDER — AMIODARONE HCL 200 MG PO TABS
200.0000 mg | ORAL_TABLET | Freq: Every day | ORAL | Status: DC
  Administered 2024-07-18 – 2024-07-20 (×3): 200 mg via ORAL
  Filled 2024-07-14 (×3): qty 1

## 2024-07-14 MED ORDER — VANCOMYCIN HCL 750 MG/150ML IV SOLN
750.0000 mg | INTRAVENOUS | Status: DC
  Administered 2024-07-15: 750 mg via INTRAVENOUS
  Filled 2024-07-14: qty 150

## 2024-07-14 NOTE — Progress Notes (Addendum)
 Nutrition Follow-up  DOCUMENTATION CODES:   Not applicable  INTERVENTION:   -48 hour calorie count -Continue dysphagia 2 diet with thin liquids; RD weill follow for diet advancement and adjust supplement regimen as appropriate -Magic cup TID with meals, each supplement provides 290 kcal and 9 grams of protein  -Feeding assistance with meals -TF via NGT- transition to nocturnal feeds:   Osmolite 1.5 @ 90 ml/hr x 14 hours (1800-0800)   60 ml Proosurce TF20 daily  30 ml free water  flush every 4 hours  Tube feeding regimen provides 1890 kcal (100% of needs), 109 grams of protein, and 960 ml of H2O. Total free water : 1140 ml daily  -Continue 1 packet Juven BID, each packet provides 95 calories, 2.5 grams of protein (collagen), and 9.8 grams of carbohydrate (3 grams sugar); also contains 7 grams of L-arginine and L-glutamine, 300 mg vitamin C , 15 mg vitamin E, 1.2 mcg vitamin B-12, 9.5 mg zinc, 200 mg calcium , and 1.5 g  Calcium  Beta-hydroxy-Beta-methylbutyrate to support wound healing  -Plan of care discussed with RN, MD, and SLP   NUTRITION DIAGNOSIS:   Inadequate oral intake related to inability to eat (pt sedated and ventilated) as evidenced by NPO status.  Ongoing  GOAL:   Patient will meet greater than or equal to 90% of their needs  Met with TF  MONITOR:   Diet advancement, Labs, Weight trends, TF tolerance, Skin, I & O's  REASON FOR ASSESSMENT:   Ventilator    ASSESSMENT:   64 y/o female with h/o ESRD on HD, CAD s/p CABG, ischemic cardiomyopathy, dementia, CVA, PVD, PAF, COPD, HLD, hypothyroid disease, CHF, HCV, cirrhosis, HTN,  DDD, chronic pain, GERD, bipolar disorder, gout, diverticulosis and pulmonary hypertension who is admitted with cardiac arrest, bacteremia and septic shock.  1/26- extubated, NGT placed 1/28- TF initiated at trickle rate 1/30- s/p BSE- dysphagia 2 diet with thin liquids   Reviewed I/O's: +452 ml x 24 hours and +8.5 L since  admission  Patient receiving personal care at time of visit.   Case discussed with MD and SLP. She has advanced to a dysphagia 2 diet with thin liquids this morning. Discussed recommendation for calorie count and transition to nocturnal feeds to stimulate appetite and determine eligibility for removal of NGT.   Reviewed weights; weights have ranged from 72.3-79.9 kg over the past 5 days. Suspect some weight fluctuations related to HD. Noted patient is +8.5 L since admission.   Medications reviewed and include midodrine .   Labs reviewed: Na: 133, CBGS: 84-108 (inpatient orders for glycemic control are none).    Diet Order:   Diet Order             DIET DYS 2 Room service appropriate? Yes with Assist; Fluid consistency: Thin  Diet effective now                   EDUCATION NEEDS:   No education needs have been identified at this time  Skin:  Skin Assessment: Reviewed RN Assessment (ulcers L foot and heel, Stage II sacrum)  Last BM:  07/14/24 (type 7)  Height:   Ht Readings from Last 1 Encounters:  07/01/24 5' 6 (1.676 m)    Weight:   Wt Readings from Last 1 Encounters:  07/14/24 78.4 kg    Ideal Body Weight:  59 kg  BMI:  Body mass index is 27.9 kg/m.  Estimated Nutritional Needs:   Kcal:  1800-2100kcal/day  Protein:  90-105g/day  Fluid:  UOP +1L  Margery ORN, RD, LDN, CDCES Registered Dietitian III Certified Diabetes Care and Education Specialist If unable to reach this RD, please use RD Inpatient group chat on secure chat between hours of 8am-4 pm daily

## 2024-07-14 NOTE — Plan of Care (Signed)

## 2024-07-14 NOTE — Progress Notes (Signed)
 Physical Therapy Treatment Patient Details Name: [name] MRN: [id number] DOB: 1961/04/22 Today's Date: 07/14/2024   History of Present Illness 64 y/o female presented to ED on 07/01/24 for weakness and fall. Recent admit 1/8-1/14 for fluid overload and cardiogenic shock with syncope and collapse. Cardiac arrest on 1/18. Intubated 1/18-1/26.  PMH: COPD, ESRD on HD, CHF, bipolar disorder, chronic hypotension on midodrine , prior CVA, PVD, CAD s/p CABG x3, Afib on Eliquis     PT Comments  PT/OT co-treat 2/2 to pt's poor activity tolerance and need for +2 assist for all mobility. Per OT, pt's cognition is much improved but still has disorientation and poor awareness of current deficits and abilities. Pt does put forth great effort throughout session but continues to be limited by pain and weakness. She rolled L/R with +2 max assist and does achieve EOB sitting and tolerated for ~ 17 minutes. Stood 1 x total assist prior to returning to bed and being repositioned +2 assist. Pt will greatly benefit from continued skilled PT at DC to maximize independence and safety with all ADLs.    If plan is discharge home, recommend the following: Two people to help with walking and/or transfers;Two people to help with bathing/dressing/bathroom;Assistance with feeding;Assistance with cooking/housework;Direct supervision/assist for medications management;Direct supervision/assist for financial management;Assist for transportation;Help with stairs or ramp for entrance;Supervision due to cognitive status     Equipment Recommendations  Other (comment) (Defer to next level of care)       Precautions / Restrictions Precautions Precautions: Fall Recall of Precautions/Restrictions: Impaired Restrictions Weight Bearing Restrictions Per Provider Order: No     Mobility  Bed Mobility Overal bed mobility: Needs Assistance Bed Mobility: Supine to Sit, Sit to Supine, Rolling Rolling: Total assist, +2 for physical  assistance, Used rails Supine to sit: Total assist, +2 for physical assistance, +2 for safety/equipment Sit to supine: Total assist, +2 for physical assistance, +2 for safety/equipment    Transfers Overall transfer level: Needs assistance Equipment used: None Transfers: Sit to/from Stand Sit to Stand: Total assist  General transfer comment: pt stood 1 x from elevated bed height with max assist +2(total). only recommend use of hoyer lift for OOB activity at this time.    Ambulation/Gait  General Gait Details: unable   Balance Overall balance assessment: Needs assistance Sitting-balance support: Feet unsupported, Single extremity supported Sitting balance-Leahy Scale: Poor     Standing balance support: During functional activity, Reliant on assistive device for balance Standing balance-Leahy Scale: Zero Standing balance comment: +2 assist for safe standing     Communication Communication Communication: Impaired Factors Affecting Communication: Difficulty expressing self;Reduced clarity of speech  Cognition Arousal: Alert Behavior During Therapy: Flat affect   PT - Cognitive impairments: Orientation, Awareness, Attention, Problem solving, Safety/Judgement   Orientation impairments: Situation, Time    PT - Cognition Comments: Pt is alert and talkative but author questions if pt knows current situation and deficits. Following commands: Intact Following commands impaired: Follows one step commands with increased time    Cueing Cueing Techniques: Verbal cues, Tactile cues     General Comments General comments (skin integrity, edema, etc.): bleeding wounds from sacrum, notified nurse      Pertinent Vitals/Pain Pain Assessment Pain Location: neck, LLE achilies, and sacral Pain Descriptors / Indicators: Discomfort, Sore Pain Intervention(s): Limited activity within patient's tolerance, Monitored during session, Premedicated before session, Repositioned     PT Goals (current  goals can now be found in the care plan section) Progress towards PT goals: Progressing toward  goals    Frequency    Min 2X/week       Co-evaluation   Reason for Co-Treatment: Complexity of the patient's impairments (multi-system involvement);Necessary to address cognition/behavior during functional activity;For patient/therapist safety;To address functional/ADL transfers PT goals addressed during session: Mobility/safety with mobility;Balance;Proper use of DME;Strengthening/ROM OT goals addressed during session: ADL's and self-care      AM-PAC PT 6 Clicks Mobility   Outcome Measure  Help needed turning from your back to your side while in a flat bed without using bedrails?: Total Help needed moving from lying on your back to sitting on the side of a flat bed without using bedrails?: Total Help needed moving to and from a bed to a chair (including a wheelchair)?: Total Help needed standing up from a chair using your arms (e.g., wheelchair or bedside chair)?: Total Help needed to walk in hospital room?: Total Help needed climbing 3-5 steps with a railing? : Total 6 Click Score: 6    End of Session Equipment Utilized During Treatment: Oxygen  Activity Tolerance: Patient limited by pain Patient left: in bed;with call bell/phone within reach;with bed alarm set Nurse Communication: Mobility status PT Visit Diagnosis: Other abnormalities of gait and mobility (R26.89);Pain;Difficulty in walking, not elsewhere classified (R26.2) Pain - Right/Left: Left Pain - part of body: Ankle and joints of foot     Time: 8866-8786 PT Time Calculation (min) (ACUTE ONLY): 40 min  Charges:    $Therapeutic Activity: 23-37 mins PT General Charges $$ ACUTE PT VISIT: 1 Visit                     Rankin Essex PTA 07/14/24, 2:13 PM

## 2024-07-14 NOTE — Progress Notes (Signed)
 Patient's morning BP 74/46. Map of 55. Pt lethargic. Dr. Ellouise Haber made aware. Per Dr. Haber, still give AM PO Amiodarone . Given as ordered. 250ml NS fluid bolus ordered and given. Post infusion BP: 95/41. Map of 58. Dr. Haber made aware.

## 2024-07-14 NOTE — Evaluation (Signed)
 Clinical/Bedside Swallow Evaluation Patient Details  Name: [name] MRN: [id number] Date of Birth: [birth year]  Today's Date: 07/14/2024 Time: SLP Start Time (ACUTE ONLY): 1100 SLP Stop Time (ACUTE ONLY): 1131 SLP Time Calculation (min) (ACUTE ONLY): 31 min  Past Medical History:  Past Medical History:  Diagnosis Date   Acute lacunar infarction (HCC) 03/03/2014   a.) MRI brain 03/03/2014: two acute punctate lacunar infarcts anteriorly in the BILATERAL centrum semiovale   Adult behavior problems    Anemia of chronic renal failure    Aortic atherosclerosis    Arthritis    Atrial fibrillation and flutter (HCC)    a.) CHA2DS2VASc = 6 (sex, HFrEF, HTN, CVA x 2, prior MI/vascular disease) as of 02/04/2024; b.) rate/rhythm maintained on oral metoprolol  succinate; chronically anticoagulated with apixaban    Bipolar disorder (HCC)    Cardiomegaly    Cerebral microvascular disease    CHF (congestive heart failure) (HCC)    Chicken pox    Cholelithiasis    Chronic respiratory failure with hypoxia (HCC) 12/19/2021   COPD (chronic obstructive pulmonary disease) (HCC)    Coronary artery disease    DDD (degenerative disc disease), cervical    DDD (degenerative disc disease), thoracolumbar    Diverticulosis    Dyspnea    ESRD (end stage renal disease) on dialysis (M,W,F)    GERD (gastroesophageal reflux disease)    Gout    HCV (hepatitis C virus)    Headache    HFrEF (heart failure with reduced ejection fraction) (HCC)    History of delirium 04/12/2014   History of substance abuse (HCC)    a.) tobacco + cocaine   Hyperkalemia 11/13/2014   Hyperlipidemia    Hypertension    Hypotension 02/24/2014   Hypothyroidism    Ischemic cardiomyopathy    Nose colonized with MRSA 02/01/2024   a.) presurgical PCR (+) on 02/01/2024 prior to A/V FISTULAGRAM; b.) presurgical PCR (+) on 03/07/2024 prior to AV FISTULA CREATION   NSTEMI (non-ST elevated myocardial infarction) (HCC) 10/08/2013    NSTEMI (non-ST elevated myocardial infarction) (HCC) 01/23/2002   a.) LHC 01/24/2002 --> multi-vessel CAD with IRA being 90% OM2 --> delayed PCI until 01/25/2002 --> 3.0 x 23 mm BX Velocity stent   Obesity    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    On apixaban  therapy    Peripheral vascular disease    Pneumonia    Postoperative anemia due to acute blood loss 02/27/2014   Postoperative cerebrovascular infarction following cardiac surgery 02/21/2014   a.) developed RIGHT upper/lower extremity weakness following cardiac revascularization --> neuro consult and head CT --> Hypodensity noted in the area of the LEFT motor cortex consistent with a component of acute ischemia   Pulmonary hypertension (HCC)    RBBB (right bundle branch block)    Renal osteodystrophy    S/P CABG x 3 02/20/2014   a.) LIMA-LAD, SVG-OM1, SVG-PDA   Stroke (HCC) 05/23/2010   a.) brain MRI 05/23/2010: BILATERAL acute infarcts and old prior infarcts; RIGHT frontal lobe extending into the white matter. Small focus of restricted diffusion in the cortex of the medial LEFT frontoparietal lobe. Periventricular T2 and multiple foci of T2 hyperintensity in the subcortical white matter and increased FLAIR signal in the cortex of the LEFT frontal lobe.   Syncope and collapse 07/25/2020   Past Surgical History:  Past Surgical History:  Procedure Laterality Date   A/V FISTULAGRAM Left 03/30/2019   Procedure: A/V FISTULAGRAM;  Surgeon: Marea Selinda RAMAN, MD;  Location:  [facility];  Service: Cardiovascular;  Laterality: Left;   A/V FISTULAGRAM Left 10/06/2019   Procedure: A/V FISTULAGRAM;  Surgeon: Marea Selinda RAMAN, MD;  Location: [facility];  Service: Cardiovascular;  Laterality: Left;   A/V FISTULAGRAM Left 05/19/2021   Procedure: A/V FISTULAGRAM;  Surgeon: Marea Selinda RAMAN, MD;  Location: [facility];  Service: Cardiovascular;  Laterality: Left;   A/V FISTULAGRAM Left 01/29/2022   Procedure: A/V  Fistulagram;  Surgeon: Marea Selinda RAMAN, MD;  Location: [facility];  Service: Cardiovascular;  Laterality: Left;   A/V FISTULAGRAM Left 04/30/2022   Procedure: A/V Fistulagram;  Surgeon: Marea Selinda RAMAN, MD;  Location: [facility];  Service: Cardiovascular;  Laterality: Left;   A/V FISTULAGRAM Left 08/20/2022   Procedure: A/V Fistulagram;  Surgeon: Marea Selinda RAMAN, MD;  Location: [facility];  Service: Cardiovascular;  Laterality: Left;   A/V FISTULAGRAM N/A 12/03/2022   Procedure: A/V Fistulagram;  Surgeon: Marea Selinda RAMAN, MD;  Location: [facility];  Service: Cardiovascular;  Laterality: N/A;   A/V FISTULAGRAM Left 02/11/2023   Procedure: A/V Fistulagram;  Surgeon: Marea Selinda RAMAN, MD;  Location: [facility];  Service: Cardiovascular;  Laterality: Left;   A/V FISTULAGRAM Left 02/07/2024   Procedure: A/V Fistulagram;  Surgeon: Marea Selinda RAMAN, MD;  Location: [facility];  Service: Cardiovascular;  Laterality: Left;   COLONOSCOPY     COLONOSCOPY WITH PROPOFOL  N/A 04/22/2021   Procedure: COLONOSCOPY WITH PROPOFOL ;  Surgeon: Maryruth Ole DASEN, MD;  Location: [facility];  Service: Endoscopy;  Laterality: N/A;   CORONARY ANGIOPLASTY WITH STENT PLACEMENT Left 01/25/2002   Procedure: CORONARY ANGIOPLASTY WITH STENT PLACEMENT; Location: [facility]; Surgeon: Margie Lovelace, MD   CORONARY ARTERY BYPASS GRAFT N/A 02/20/2014   Procedure: CORONARY ARTERY BYPASS GRAFT; Location: [facility]; Surgeon: Reyes Fruits, MD   DIALYSIS FISTULA CREATION     DIALYSIS/PERMA CATHETER INSERTION N/A 04/08/2023   Procedure: DIALYSIS/PERMA CATHETER INSERTION;  Surgeon: Marea Selinda RAMAN, MD;  Location: [facility];  Service: Cardiovascular;  Laterality: N/A;   DIALYSIS/PERMA CATHETER REMOVAL N/A 07/06/2023   Procedure: DIALYSIS/PERMA CATHETER REMOVAL;  Surgeon: [name] Cordella MATSU, MD;  Location: [facility];  Service: Cardiovascular;  Laterality: N/A;   ESOPHAGOGASTRODUODENOSCOPY      ESOPHAGOGASTRODUODENOSCOPY N/A 04/22/2021   Procedure: ESOPHAGOGASTRODUODENOSCOPY (EGD);  Surgeon: Maryruth Ole DASEN, MD;  Location: [facility];  Service: Endoscopy;  Laterality: N/A;   FLEXIBLE SIGMOIDOSCOPY N/A 02/23/2019   Procedure: FLEXIBLE SIGMOIDOSCOPY;  Surgeon: Gaylyn Gladis PENNER, MD;  Location: [facility] END[facility];  Service: Endoscopy;  Laterality: N/A;   IABP INSERTION  02/20/2014   Procedure: INTRA-AORTIC BALLOON PUMP INSERTION; Location: [facility]; Surgeon: Reyes Fruits, MD   INSERTION OF ARTERIOVENOUS (AV) ARTEGRAFT ARM Right 03/09/2024   Procedure: INSERTION, GRAFT, ARTERIOVENOUS, UPPER EXTREMITY;  Surgeon: Marea Selinda RAMAN, MD;  Location: [facility];  Service: Vascular;  Laterality: Right;  BRACHIAL AXILLARY   INSERTION OF DIALYSIS CATHETER N/A 04/20/2024   Procedure: INSERTION OF DIALYSIS CATHETER;  Surgeon: Marea Selinda RAMAN, MD;  Location: [facility];  Service: Vascular;  Laterality: N/A;   LEFT HEART CATH AND CORONARY ANGIOGRAPHY Left 01/24/2002   Procedure: LEFT HEART CATH AND CORONARY ANGIOGRAPHY; Location: [facility]; Surgeon: Margie Lovelace, MD   LEFT HEART CATH AND CORONARY ANGIOGRAPHY Left 05/25/2012   Procedure: LEFT HEART CATH AND CORONARY ANGIOGRAPHY; Location: [facility]; Surgeon: Margie Lovelace, MD   LEFT HEART CATH AND CORONARY ANGIOGRAPHY Left 10/09/2013   Procedure: LEFT  HEART CATH AND CORONARY ANGIOGRAPHY; Location: [facility]; Surgeon: Vinie Jude, MD   LEFT HEART CATH AND CORS/GRAFTS ANGIOGRAPHY N/A 08/08/2018   Procedure: LEFT HEART CATH AND CORS/GRAFTS ANGIOGRAPHY;  Surgeon: Fernand Denyse LABOR, MD;  Location: [facility];  Service: Cardiovascular;  Laterality: N/A;   LEFT HEART CATH AND CORS/GRAFTS ANGIOGRAPHY N/A 01/30/2019   Procedure: LEFT HEART CATH AND CORS/GRAFTS ANGIOGRAPHY;  Surgeon: Fernand Denyse LABOR, MD;  Location: [facility];  Service: Cardiovascular;  Laterality: N/A;   LIGATION ARTERIOVENOUS GORTEX GRAFT Right 04/20/2024   Procedure: LIGATION ARTERIOVENOUS  GORTEX GRAFT;  Surgeon: Marea Selinda RAMAN, MD;  Location: [facility];  Service: Vascular;  Laterality: Right;   LOWER EXTREMITY ANGIOGRAPHY Left 04/24/2024   Procedure: Lower Extremity Angiography;  Surgeon: Marea Selinda RAMAN, MD;  Location: [facility];  Service: Cardiovascular;  Laterality: Left;   ultrasound guided pericardiocentesis     UPPER EXTREMITY ANGIOGRAPHY Right 03/30/2024   Procedure: Upper Extremity Angiography;  Surgeon: Marea Selinda RAMAN, MD;  Location: [facility];  Service: Cardiovascular;  Laterality: Right;   HPI:  Per MD progress note, [name] is a 64 y.o. female with a pmh of of ESRD on HD (MWF), COPD, CHF, bipolar disorder, chronic hypotension on midodrine , HLD and prior CVA who presented to [facility] ED via EMS following syncopal episode. Pt intubated 1/18-1/26. MRI 1/23: No acute abnormality.  2. Multiple remote infarcts as above.    Assessment / Plan / Recommendation  Clinical Impression  Pt seen for bedside swallow assessment s/p extubation (9 days, extubated 1/26). Pt now on 2L. Overall vocal quality is clear and cough strength is strong, with congested quality. Cough noted at baseline- in the absence of PO intake. NG in place. Oral care completed. History of dysphagia includes remote dysphagia services in 2015, with final recommendations at that time of chopped solids and thin liquids.   Pt seen with trials of ice chips, thin liquids (via cup, straw), puree, and regular solids. Total assist provided for intake- suspect that pt could complete with less assistance- did not attempt to self feed today when prompted. Noted delayed congested cough following completion of thin liquids trial, consistent with baseline cough, and not repeated with additional trials of thin liquids. Oral phase min prolonged for manipulation and clearance of regular solids- suspect that fatigue/endurance with impede PO intake.   Based on current debility/deconditioning, recent intubation, hx  of CVA/dysphagia/COPD, pt is at increased risk of aspiration; therefore, recommend STRICT aspiration precautions (slow rate, small bites, elevated HOB, and alert for PO intake). Recommend chopped solids and thin liquids diet with medications whole in puree. Monitor for alertness/endurance with intake- supervision with intake. Board updated with recommendations. RN, MD, and dietician aware of recommendations. SLP will follow to determine safety with PO intake.   SLP Visit Diagnosis: Dysphagia, unspecified (R13.10) (related to AMS, recent intubation, COPD, hx of CVA)    Aspiration Risk  Mild aspiration risk    Diet Recommendation   Thin;Dysphagia 2 (chopped)  Medication Administration: Whole meds with puree    Other Recommendations Oral Care Recommendations: Oral care BID;Staff/trained caregiver to provide oral care      Functional Status Assessment Patient has had a recent decline in their functional status and demonstrates the ability to make significant improvements in function in a reasonable and predictable amount of time.  Frequency and Duration min 2x/week  2 weeks       Prognosis Prognosis for improved oropharyngeal function: Good Barriers  to Reach Goals:  (endurance deficits)      Swallow Study   General Date of Onset: 07/14/24 HPI: Per MD progress note, [name] is a 64 y.o. female with a pmh of of ESRD on HD (MWF), COPD, CHF, bipolar disorder, chronic hypotension on midodrine , HLD and prior CVA who presented to [facility] ED via EMS following syncopal episode. Pt intubated 1/18-1/26. MRI 1/23: No acute abnormality.  2. Multiple remote infarcts as above. Type of Study: Bedside Swallow Evaluation Previous Swallow Assessment: remote hx of dysphagia in 2015 Diet Prior to this Study: NPO;Cortrak/Small bore NG tube Temperature Spikes Noted: No (98.2; WBC 13.4) Respiratory Status: Nasal cannula (2L) History of Recent Intubation: Yes Total duration of intubation (days): 9  days Date extubated: 07/10/24 Behavior/Cognition: Alert;Cooperative;Confused Oral Cavity Assessment: Within Functional Limits Oral Care Completed by SLP: Yes Oral Cavity - Dentition: Adequate natural dentition Vision: Functional for self-feeding Self-Feeding Abilities: Total assist (suspect pt with more potential to feed independently) Patient Positioning: Upright in bed Baseline Vocal Quality: Normal Volitional Cough: Congested Volitional Swallow: Able to elicit    Oral/Motor/Sensory Function Overall Oral Motor/Sensory Function: Within functional limits   Ice Chips Ice chips: Within functional limits Presentation: Spoon   Thin Liquid Thin Liquid: Within functional limits Presentation: Cup;Straw    Nectar Thick Nectar Thick Liquid: Not tested   Honey Thick Honey Thick Liquid: Not tested   Puree Puree: Within functional limits Presentation: Spoon   Solid     Solid: Impaired Oral Phase Impairments: Impaired mastication Oral Phase Functional Implications: Prolonged oral transit     Shannie Kontos Clapp, MS, CCC-SLP Speech Language Pathologist Rehab Services; [facility] [phone] (ascom)   Katye Valek J Clapp 07/14/2024,12:04 PM

## 2024-07-14 NOTE — Progress Notes (Signed)
 [facility] Cardiology  CARDIOLOGY CONSULT NOTE  Patient ID: [name] MRN: [id number] DOB/AGE: Apr 17, 1961 64 y.o.  Admit date: 07/01/2024 Referring Physician Dr. Awanda Reason for Consultation Vtach arrest  HPI: Mentation improved today.  She is alert but not completely oriented.  Complains of back/neck pain  Review of systems complete and found to be negative unless listed above     Past Medical History:  Diagnosis Date   Acute lacunar infarction (HCC) 03/03/2014   a.) MRI brain 03/03/2014: two acute punctate lacunar infarcts anteriorly in the BILATERAL centrum semiovale   Adult behavior problems    Anemia of chronic renal failure    Aortic atherosclerosis    Arthritis    Atrial fibrillation and flutter (HCC)    a.) CHA2DS2VASc = 6 (sex, HFrEF, HTN, CVA x 2, prior MI/vascular disease) as of 02/04/2024; b.) rate/rhythm maintained on oral metoprolol  succinate; chronically anticoagulated with apixaban    Bipolar disorder (HCC)    Cardiomegaly    Cerebral microvascular disease    CHF (congestive heart failure) (HCC)    Chicken pox    Cholelithiasis    Chronic respiratory failure with hypoxia (HCC) 12/19/2021   COPD (chronic obstructive pulmonary disease) (HCC)    Coronary artery disease    DDD (degenerative disc disease), cervical    DDD (degenerative disc disease), thoracolumbar    Diverticulosis    Dyspnea    ESRD (end stage renal disease) on dialysis (M,W,F)    GERD (gastroesophageal reflux disease)    Gout    HCV (hepatitis C virus)    Headache    HFrEF (heart failure with reduced ejection fraction) (HCC)    History of delirium 04/12/2014   History of substance abuse (HCC)    a.) tobacco + cocaine   Hyperkalemia 11/13/2014   Hyperlipidemia    Hypertension    Hypotension 02/24/2014   Hypothyroidism    Ischemic cardiomyopathy    Nose colonized with MRSA 02/01/2024   a.) presurgical PCR (+) on 02/01/2024 prior to A/V FISTULAGRAM; b.) presurgical PCR (+) on 03/07/2024  prior to AV FISTULA CREATION   NSTEMI (non-ST elevated myocardial infarction) (HCC) 10/08/2013   NSTEMI (non-ST elevated myocardial infarction) (HCC) 01/23/2002   a.) LHC 01/24/2002 --> multi-vessel CAD with IRA being 90% OM2 --> delayed PCI until 01/25/2002 --> 3.0 x 23 mm BX Velocity stent   Obesity    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    On apixaban  therapy    Peripheral vascular disease    Pneumonia    Postoperative anemia due to acute blood loss 02/27/2014   Postoperative cerebrovascular infarction following cardiac surgery 02/21/2014   a.) developed RIGHT upper/lower extremity weakness following cardiac revascularization --> neuro consult and head CT --> Hypodensity noted in the area of the LEFT motor cortex consistent with a component of acute ischemia   Pulmonary hypertension (HCC)    RBBB (right bundle branch block)    Renal osteodystrophy    S/P CABG x 3 02/20/2014   a.) LIMA-LAD, SVG-OM1, SVG-PDA   Stroke (HCC) 05/23/2010   a.) brain MRI 05/23/2010: BILATERAL acute infarcts and old prior infarcts; RIGHT frontal lobe extending into the white matter. Small focus of restricted diffusion in the cortex of the medial LEFT frontoparietal lobe. Periventricular T2 and multiple foci of T2 hyperintensity in the subcortical white matter and increased FLAIR signal in the cortex of the LEFT frontal lobe.   Syncope and collapse 07/25/2020    Past Surgical History:  Procedure Laterality Date  A/V FISTULAGRAM Left 03/30/2019   Procedure: A/V FISTULAGRAM;  Surgeon: Marea Selinda RAMAN, MD;  Location: [facility];  Service: Cardiovascular;  Laterality: Left;   A/V FISTULAGRAM Left 10/06/2019   Procedure: A/V FISTULAGRAM;  Surgeon: Marea Selinda RAMAN, MD;  Location: [facility];  Service: Cardiovascular;  Laterality: Left;   A/V FISTULAGRAM Left 05/19/2021   Procedure: A/V FISTULAGRAM;  Surgeon: Marea Selinda RAMAN, MD;  Location: [facility];  Service: Cardiovascular;   Laterality: Left;   A/V FISTULAGRAM Left 01/29/2022   Procedure: A/V Fistulagram;  Surgeon: Marea Selinda RAMAN, MD;  Location: [facility];  Service: Cardiovascular;  Laterality: Left;   A/V FISTULAGRAM Left 04/30/2022   Procedure: A/V Fistulagram;  Surgeon: Marea Selinda RAMAN, MD;  Location: [facility];  Service: Cardiovascular;  Laterality: Left;   A/V FISTULAGRAM Left 08/20/2022   Procedure: A/V Fistulagram;  Surgeon: Marea Selinda RAMAN, MD;  Location: [facility];  Service: Cardiovascular;  Laterality: Left;   A/V FISTULAGRAM N/A 12/03/2022   Procedure: A/V Fistulagram;  Surgeon: Marea Selinda RAMAN, MD;  Location: [facility];  Service: Cardiovascular;  Laterality: N/A;   A/V FISTULAGRAM Left 02/11/2023   Procedure: A/V Fistulagram;  Surgeon: Marea Selinda RAMAN, MD;  Location: [facility];  Service: Cardiovascular;  Laterality: Left;   A/V FISTULAGRAM Left 02/07/2024   Procedure: A/V Fistulagram;  Surgeon: Marea Selinda RAMAN, MD;  Location: [facility];  Service: Cardiovascular;  Laterality: Left;   COLONOSCOPY     COLONOSCOPY WITH PROPOFOL  N/A 04/22/2021   Procedure: COLONOSCOPY WITH PROPOFOL ;  Surgeon: Maryruth Ole DASEN, MD;  Location: [facility];  Service: Endoscopy;  Laterality: N/A;   CORONARY ANGIOPLASTY WITH STENT PLACEMENT Left 01/25/2002   Procedure: CORONARY ANGIOPLASTY WITH STENT PLACEMENT; Location: [facility]; Surgeon: Margie Lovelace, MD   CORONARY ARTERY BYPASS GRAFT N/A 02/20/2014   Procedure: CORONARY ARTERY BYPASS GRAFT; Location: [facility]; Surgeon: Reyes Fruits, MD   DIALYSIS FISTULA CREATION     DIALYSIS/PERMA CATHETER INSERTION N/A 04/08/2023   Procedure: DIALYSIS/PERMA CATHETER INSERTION;  Surgeon: Marea Selinda RAMAN, MD;  Location: [facility];  Service: Cardiovascular;  Laterality: N/A;   DIALYSIS/PERMA CATHETER REMOVAL N/A 07/06/2023   Procedure: DIALYSIS/PERMA CATHETER REMOVAL;  Surgeon: [name] Cordella MATSU, MD;  Location: [facility];   Service: Cardiovascular;  Laterality: N/A;   ESOPHAGOGASTRODUODENOSCOPY     ESOPHAGOGASTRODUODENOSCOPY N/A 04/22/2021   Procedure: ESOPHAGOGASTRODUODENOSCOPY (EGD);  Surgeon: Maryruth Ole DASEN, MD;  Location: [facility] ENDOSCOPY;  Service: Endoscopy;  Laterality: N/A;   FLEXIBLE SIGMOIDOSCOPY N/A 02/23/2019   Procedure: FLEXIBLE SIGMOIDOSCOPY;  Surgeon: Gaylyn Gladis PENNER, MD;  Location: [facility];  Service: Endoscopy;  Laterality: N/A;   IABP INSERTION  02/20/2014   Procedure: INTRA-AORTIC BALLOON PUMP INSERTION; Location: [facility]; Surgeon: Reyes Fruits, MD   INSERTION OF ARTERIOVENOUS (AV) ARTEGRAFT ARM Right 03/09/2024   Procedure: INSERTION, GRAFT, ARTERIOVENOUS, UPPER EXTREMITY;  Surgeon: Marea Selinda RAMAN, MD;  Location: [facility];  Service: Vascular;  Laterality: Right;  BRACHIAL AXILLARY   INSERTION OF DIALYSIS CATHETER N/A 04/20/2024   Procedure: INSERTION OF DIALYSIS CATHETER;  Surgeon: Marea Selinda RAMAN, MD;  Location: [facility];  Service: Vascular;  Laterality: N/A;   LEFT HEART CATH AND CORONARY ANGIOGRAPHY Left 01/24/2002   Procedure: LEFT HEART CATH AND CORONARY ANGIOGRAPHY; Location: [facility]; Surgeon: Margie Lovelace, MD   LEFT HEART CATH AND CORONARY ANGIOGRAPHY Left 05/25/2012   Procedure: LEFT HEART CATH AND CORONARY ANGIOGRAPHY; Location: [facility]; Surgeon:  Margie Lovelace, MD   LEFT HEART CATH AND CORONARY ANGIOGRAPHY Left 10/09/2013   Procedure: LEFT HEART CATH AND CORONARY ANGIOGRAPHY; Location: [facility]; Surgeon: Vinie Jude, MD   LEFT HEART CATH AND CORS/GRAFTS ANGIOGRAPHY N/A 08/08/2018   Procedure: LEFT HEART CATH AND CORS/GRAFTS ANGIOGRAPHY;  Surgeon: Fernand Denyse LABOR, MD;  Location: [facility];  Service: Cardiovascular;  Laterality: N/A;   LEFT HEART CATH AND CORS/GRAFTS ANGIOGRAPHY N/A 01/30/2019   Procedure: LEFT HEART CATH AND CORS/GRAFTS ANGIOGRAPHY;  Surgeon: Fernand Denyse LABOR, MD;  Location: [facility];  Service: Cardiovascular;  Laterality: N/A;   LIGATION  ARTERIOVENOUS GORTEX GRAFT Right 04/20/2024   Procedure: LIGATION ARTERIOVENOUS GORTEX GRAFT;  Surgeon: Marea Selinda RAMAN, MD;  Location: [facility];  Service: Vascular;  Laterality: Right;   LOWER EXTREMITY ANGIOGRAPHY Left 04/24/2024   Procedure: Lower Extremity Angiography;  Surgeon: Marea Selinda RAMAN, MD;  Location: [facility];  Service: Cardiovascular;  Laterality: Left;   ultrasound guided pericardiocentesis     UPPER EXTREMITY ANGIOGRAPHY Right 03/30/2024   Procedure: Upper Extremity Angiography;  Surgeon: Marea Selinda RAMAN, MD;  Location: [facility];  Service: Cardiovascular;  Laterality: Right;    Medications Prior to Admission  Medication Sig Dispense Refill Last Dose/Taking   albuterol  (VENTOLIN  HFA) 108 (90 Base) MCG/ACT inhaler Inhale 1-2 puffs into the lungs every 6 (six) hours as needed for wheezing or shortness of breath. 20.1 g 3 Unknown   apixaban  (ELIQUIS ) 5 MG TABS tablet Take 5 mg by mouth 2 (two) times daily.   06/30/2024   clobetasol cream (TEMOVATE) 0.05 % Apply 1 Application topically daily.   Unknown   hydrOXYzine  (ATARAX /VISTARIL ) 50 MG tablet Take 50 mg by mouth 2 (two) times daily.   06/30/2024   levothyroxine  (SYNTHROID ) 75 MCG tablet Take 75 mcg by mouth daily before breakfast.    06/30/2024   lidocaine -prilocaine  (EMLA ) cream Apply 1 Application topically once.   Unknown   [Paused] metoprolol  succinate (TOPROL -XL) 25 MG 24 hr tablet Take 1 tablet by mouth every morning.   Taking   midodrine  (PROAMATINE ) 10 MG tablet Take 1 tablet (10 mg total) by mouth 3 (three) times daily. 90 tablet 0 06/30/2024   oxyCODONE -acetaminophen  (PERCOCET) 5-325 MG tablet Take 1 tablet by mouth every 6 (six) hours as needed for severe pain (pain score 7-10). 20 tablet 0 07/01/2024 at  4:00 AM   pantoprazole  (PROTONIX ) 40 MG tablet Take 1 tablet (40 mg total) by mouth every morning. 30 tablet 1 06/30/2024   pregabalin (LYRICA) 50 MG capsule Take 50 mg by mouth daily. (Patient not taking:  Reported on 06/23/2024)   Not Taking   ranolazine  (RANEXA ) 1000 MG SR tablet Take 1,000 mg by mouth 2 (two) times daily. (Patient not taking: Reported on 06/23/2024)   Not Taking   rosuvastatin  (CRESTOR ) 20 MG tablet Take 20 mg by mouth at bedtime. (Patient not taking: Reported on 06/23/2024)   Not Taking   sevelamer  carbonate (RENVELA ) 800 MG tablet Take 3,200 mg by mouth 3 (three) times daily with meals. (Patient not taking: Reported on 06/23/2024)   Not Taking   venlafaxine  (EFFEXOR ) 37.5 MG tablet Take 37.5 mg by mouth 2 (two) times daily with a meal. (Patient not taking: Reported on 06/23/2024)   Not Taking   Social History   Socioeconomic History   Marital status: Single    Spouse name: Not on file   Number of children: Not on file   Years of education: Not  on file   Highest education level: Not on file  Occupational History   Not on file  Tobacco Use   Smoking status: Every Day    Current packs/day: 0.25    Average packs/day: 0.3 packs/day for 20.0 years (5.0 ttl pk-yrs)    Types: Cigarettes   Smokeless tobacco: Never   Tobacco comments:    smokes 1 - 1.5 cigarettes a day  Vaping Use   Vaping status: Never Used  Substance and Sexual Activity   Alcohol use: No    Comment: social   Drug use: No   Sexual activity: Not Currently    Birth control/protection: Post-menopausal  Other Topics Concern   Not on file  Social History Narrative   Lives alone   Social Drivers of Health   Tobacco Use: High Risk (07/01/2024)   Patient History    Smoking Tobacco Use: Every Day    Smokeless Tobacco Use: Never    Passive Exposure: Not on file  Financial Resource Strain: Low Risk  (05/31/2024)   Received from [facility]   Overall Financial Resource Strain (CARDIA)    Difficulty of Paying Living Expenses: Not hard at all  Food Insecurity: No Food Insecurity (07/02/2024)   Epic    Worried About Radiation Protection Practitioner of Food in the Last Year: Never true    Ran Out of Food in the Last  Year: Never true  Transportation Needs: No Transportation Needs (07/02/2024)   Epic    Lack of Transportation (Medical): No    Lack of Transportation (Non-Medical): No  Physical Activity: Not on file  Stress: Not on file  Social Connections: Not on file  Intimate Partner Violence: Not At Risk (07/02/2024)   Epic    Fear of Current or Ex-Partner: No    Emotionally Abused: No    Physically Abused: No    Sexually Abused: No  Depression (PHQ2-9): Low Risk (07/06/2023)   Depression (PHQ2-9)    PHQ-2 Score: 3  Alcohol Screen: Not on file  Housing: Low Risk (07/02/2024)   Epic    Unable to Pay for Housing in the Last Year: No    Number of Times Moved in the Last Year: 0    Homeless in the Last Year: No  Utilities: Not At Risk (07/02/2024)   Epic    Threatened with loss of utilities: No  Health Literacy: Not on file    Family History  Problem Relation Age of Onset   Hypertension Mother    Ovarian cancer Mother    Diabetes type II Mother    Hypertension Father    Kidney disease Father    Hypertension Sister    Diabetes type II Maternal Grandmother    Breast cancer Maternal Grandmother    Breast cancer Maternal Aunt    Hypertension Other    Cancer Other    Renal Disease Other       Review of systems complete and found to be negative unless listed above      PHYSICAL EXAM  Alert but not oriented No pedal edema No wheeze Systolic murmur aortic area  Labs:   Lab Results  Component Value Date   WBC 13.4 (H) 07/14/2024   HGB 9.8 (L) 07/14/2024   HCT 31.0 (L) 07/14/2024   MCV 96.3 07/14/2024   PLT 160 07/14/2024    Recent Labs  Lab 07/14/24 0445  NA 133*  K 3.8  CL 92*  CO2 26  BUN 48*  CREATININE 3.00*  CALCIUM  9.9  GLUCOSE 97   Lab Results  Component Value Date   CKTOTAL 42 06/23/2014   CKMB 1.2 06/23/2014   TROPONINI 0.07 (HH) 08/25/2018    Lab Results  Component Value Date   CHOL 140 01/18/2019   CHOL 79 06/14/2014   CHOL 127 02/10/2014   Lab  Results  Component Value Date   HDL 43 01/18/2019   HDL 35 (L) 06/14/2014   HDL 53 02/10/2014   Lab Results  Component Value Date   LDLCALC 67 01/18/2019   LDLCALC 27 06/14/2014   LDLCALC 53 02/10/2014   Lab Results  Component Value Date   TRIG 112 07/09/2024   TRIG 155 (H) 07/06/2024   TRIG 293 (H) 07/03/2024   Lab Results  Component Value Date   CHOLHDL 3.3 01/18/2019   No results found for: LDLDIRECT    Radiology: ECHOCARDIOGRAM COMPLETE Result Date: 07/11/2024    ECHOCARDIOGRAM REPORT   Patient Name:   ANALIESE KRUPKA [facility] Date of Exam: 07/11/2024 Medical Rec #:  [id number]         Height:       66.0 in Accession #:    [id number]        Weight:       175.0 lb Date of Birth:  12-21-1960          BSA:          1.890 m Patient Age:    63 years          BP:           107/74 mmHg Patient Gender: F                 HR:           61 bpm. Exam Location:  [facility] Procedure: 2D Echo, Cardiac Doppler and Color Doppler (Both Spectral and Color            Flow Doppler were utilized during procedure). Indications:     Bacteremia  History:         Patient has prior history of Echocardiogram examinations. Prior                  CABG; Signs/Symptoms:Chest Pain and Bacteremia.  Sonographer:     Rainelle Gull Referring Phys:  [id number] DONALD BERLIN Diagnosing Phys: Lonni Hanson MD  Sonographer Comments: Suboptimal apical window. IMPRESSIONS  1. Left ventricular ejection fraction, by estimation, is 25 to 30%. The left ventricle has severely decreased function. The left ventricle demonstrates regional wall motion abnormalities (see scoring diagram/findings for description). The left ventricular internal cavity size was severely dilated. Left ventricular diastolic parameters are indeterminate. There is moderate hypokinesis of the left ventricular, entire septal wall and anterior wall. There is akinesis of the left ventricular, entire  inferior wall and inferolateral wall.  2. Right ventricular systolic  function is severely reduced. The right ventricular size is moderately enlarged. There is severely elevated pulmonary artery systolic pressure. The estimated right ventricular systolic pressure is 94.6 mmHg.  3. Left atrial size was severely dilated.  4. Right atrial size was severely dilated.  5. The mitral valve is normal in structure. Mild mitral valve regurgitation. No evidence of mitral stenosis.  6. Tricuspid valve regurgitation is severe.  7. The aortic valve is tricuspid. There is mild thickening of the aortic valve. Aortic valve regurgitation is not visualized. Mild to moderate aortic valve stenosis.  8. The inferior vena cava is dilated in size with <50% respiratory variability, suggesting right atrial  pressure of 15 mmHg. Conclusion(s)/Recommendation(s): No evidence of valvular vegetations on this transthoracic echocardiogram. Consider a transesophageal echocardiogram to exclude infective endocarditis if clinically indicated. FINDINGS  Left Ventricle: Left ventricular ejection fraction, by estimation, is 25 to 30%. The left ventricle has severely decreased function. The left ventricle demonstrates regional wall motion abnormalities. Moderate hypokinesis of the left ventricular, entire  septal wall and anterior wall. The left ventricular internal cavity size was severely dilated. There is no left ventricular hypertrophy. Left ventricular diastolic parameters are indeterminate. Right Ventricle: The right ventricular size is moderately enlarged. No increase in right ventricular wall thickness. Right ventricular systolic function is severely reduced. There is severely elevated pulmonary artery systolic pressure. The tricuspid regurgitant velocity is 4.46 m/s, and with an assumed right atrial pressure of 15 mmHg, the estimated right ventricular systolic pressure is 94.6 mmHg. Left Atrium: Left atrial size was severely dilated. Right Atrium: Right atrial size was severely dilated. Pericardium: There is no  evidence of pericardial effusion. Mitral Valve: The mitral valve is normal in structure. Mild mitral valve regurgitation. No evidence of mitral valve stenosis. Tricuspid Valve: The tricuspid valve is normal in structure. Tricuspid valve regurgitation is severe. Aortic Valve: The aortic valve is tricuspid. There is mild thickening of the aortic valve. Aortic valve regurgitation is not visualized. Mild to moderate aortic stenosis is present. Aortic valve mean gradient measures 6.0 mmHg. Aortic valve peak gradient  measures 12.0 mmHg. Aortic valve area, by VTI measures 1.56 cm. Pulmonic Valve: The pulmonic valve was normal in structure. Pulmonic valve regurgitation is trivial. No evidence of pulmonic stenosis. Aorta: The aortic root is normal in size and structure. Pulmonary Artery: The pulmonary artery is of normal size. Venous: The inferior vena cava is dilated in size with less than 50% respiratory variability, suggesting right atrial pressure of 15 mmHg. IAS/Shunts: The interatrial septum was not well visualized.  LEFT VENTRICLE PLAX 2D LVIDd:         7.30 cm LVIDs:         6.40 cm LV PW:         0.90 cm LV IVS:        0.90 cm LVOT diam:     2.10 cm LV SV:         54 LV SV Index:   29 LVOT Area:     3.46 cm  LV Volumes (MOD) LV vol d, MOD A2C: 242.0 ml LV vol d, MOD A4C: 186.0 ml LV vol s, MOD A2C: 176.0 ml LV vol s, MOD A4C: 126.0 ml LV SV MOD A2C:     66.0 ml LV SV MOD A4C:     186.0 ml LV SV MOD BP:      68.2 ml RIGHT VENTRICLE            IVC RV Basal diam:  6.00 cm    IVC diam: 2.30 cm RV Mid diam:    4.80 cm RV S prime:     6.20 cm/s TAPSE (M-mode): 1.3 cm LEFT ATRIUM              Index        RIGHT ATRIUM           Index LA Vol (A2C):   126.0 ml 66.68 ml/m  RA Area:     30.50 cm LA Vol (A4C):   114.0 ml 60.33 ml/m  RA Volume:   107.00 ml 56.62 ml/m LA Biplane Vol: 120.0 ml 63.50 ml/m  AORTIC VALVE  PULMONIC VALVE AV Area (Vmax):    1.67 cm      PV Vmax:          0.91 m/s AV Area  (Vmean):   1.60 cm      PV Vmean:         61.200 cm/s AV Area (VTI):     1.56 cm      PV VTI:           0.226 m AV Vmax:           173.00 cm/s   PV Peak grad:     3.3 mmHg AV Vmean:          111.000 cm/s  PV Mean grad:     2.0 mmHg AV VTI:            0.346 m       PR End Diast Vel: 5.64 msec AV Peak Grad:      12.0 mmHg AV Mean Grad:      6.0 mmHg LVOT Vmax:         83.60 cm/s LVOT Vmean:        51.200 cm/s LVOT VTI:          0.156 m LVOT/AV VTI ratio: 0.45  AORTA Ao Root diam: 2.80 cm Ao Asc diam:  2.40 cm TRICUSPID VALVE TR Peak grad:   79.6 mmHg TR Vmax:        446.00 cm/s  SHUNTS Systemic VTI:  0.16 m Systemic Diam: 2.10 cm Lonni Hanson MD Electronically signed by Lonni Hanson MD Signature Date/Time: 07/11/2024/5:30:36 PM    Final    DG Abd 1 View Result Date: 07/10/2024 EXAM: 1 VIEW XRAY OF THE ABDOMEN 07/10/2024 09:51:00 AM COMPARISON: 07/04/2024 CLINICAL HISTORY: Encounter for nasogastric tube placement. ICD10: 747668 Encounter for nasogastric tube placement. FINDINGS: LINES, TUBES AND DEVICES: Enteric tube in place with distal tip and side port terminating within the expected location of the gastric body. BOWEL: Nonobstructive bowel gas pattern. SOFT TISSUES: Vascular calcifications. BONES: No acute fracture. PLEURAL SPACES: Left pleural effusion. LUNGS: Left basilar airspace opacity. IMPRESSION: 1. Enteric tube in place with distal tip and side port terminating within the expected location of the gastric body. Electronically signed by: Waddell Calk MD 07/10/2024 10:10 AM EST RP Workstation: [id number]   MR BRAIN WO CONTRAST Result Date: 07/07/2024 EXAM: MRI BRAIN WITHOUT CONTRAST 07/07/2024 11:00:58 PM TECHNIQUE: Multiplanar multisequence MRI of the head/brain was performed without the administration of intravenous contrast. COMPARISON: MRI head August 16, 2023 CLINICAL HISTORY: Encephalopathy. FINDINGS: BRAIN AND VENTRICLES: No acute infarct. Multifocal remote encephalomalacia with surrounding  gliosis in the bilateral frontal lobes, right parietooccipital region, left parietal lobe, and cerebellum. No intracranial hemorrhage. No mass. No midline shift. No hydrocephalus. The sella is unremarkable. Chronic abnormal left vertebral artery flow void. ORBITS: No significant abnormality. SINUSES AND MASTOIDS: No significant abnormality. BONES AND SOFT TISSUES: Normal marrow signal. No soft tissue abnormality. IMPRESSION: 1. No acute abnormality. 2. Multiple remote infarcts as above. Electronically signed by: Glendia Molt MD 07/07/2024 11:35 PM EST RP Workstation: [id number]   DG Abd 1 View Result Date: 07/04/2024 CLINICAL DATA:  Abdominal distension. EXAM: ABDOMEN - 1 VIEW COMPARISON:  Abdominal radiograph dated 07/02/2024. FINDINGS: No bowel dilatation or evidence of obstruction. No free air or radiopaque calculi. Atherosclerotic calcification of the abdominal aorta and iliac arteries. A cannula is noted to the right of the spine. Calcified uterine fibroid. No acute osseous pathology. IMPRESSION: Nonobstructive bowel gas pattern. Electronically Signed  By: Vanetta Chou M.D.   On: 07/04/2024 13:15   DG Abd 1 View Result Date: 07/02/2024 EXAM: 1 or 2 VIEW XRAY OF THE ABDOMEN 07/02/2024 09:53:25 PM COMPARISON: None available. CLINICAL HISTORY: Encounter for orogastric (OG) tube placement FINDINGS: LINES, TUBES AND DEVICES: Enteric tube tip is in the body of the stomach. The distal 5 cm of the tube is folded upon itself/kinked. Vascular stent in place overlying the expected region of the left common iliac vein. Cardiac paddles noted overlying the left hemiabdomen. BOWEL: Paucity of bowel gas. SOFT TISSUES: Vascular calcifications are seen in the abdomen and pelvis. BONES: No acute fracture. IMPRESSION: 1. Enteric tube tip in the body of the stomach, with the distal 5 cm folded upon itself and kinked. Electronically signed by: Greig Pique MD 07/02/2024 10:02 PM EST RP Workstation: [id number]   CT  CERVICAL SPINE WO CONTRAST Result Date: 07/02/2024 EXAM: CT CERVICAL SPINE WITHOUT CONTRAST 07/02/2024 02:47:57 PM TECHNIQUE: CT of the cervical spine was performed without the administration of intravenous contrast. Multiplanar reformatted images are provided for review. Automated exposure control, iterative reconstruction, and/or weight based adjustment of the mA/kV was utilized to reduce the radiation dose to as low as reasonably achievable. COMPARISON: None available. CLINICAL HISTORY: Acute severe posterior neck pain. FINDINGS: BONES AND ALIGNMENT: Interval collapse of the superior endplate of C6 and inferior endplate of C5 and exaggerated kyphosis may reflect marked progression of degenerative change. Sequelae of infection could have a similar appearance. Slight retrolisthesis at C5-C6, C6-C7 and C7-T1 are similar to the prior exam. DEGENERATIVE CHANGES: Ankylosis is present across the disc space anteriorly. Moderate irregular endplate changes are present at C6-C7 without loss of height. Mild endplate changes are present at C4-C5 without loss of height. The endplate collapse at C5-C6 is also a degenerative change. SOFT TISSUES: No prevertebral soft tissue swelling. Atherosclerotic changes are present in the proximal great vessels and at the carotid bifurcations bilaterally without definite stenosis. IMPRESSION: 1. Interval collapse of the superior endplate of C6 and inferior endplate of C5 with exaggerated kyphosis, which may reflect marked progression of degenerative change or sequelae of infection. MRI of the cervical spine without and with contrast may be useful for further evaluation. 2. Progressive degenerative endplate changes at C4-5 and C6-7 Electronically signed by: Lonni Necessary MD 07/02/2024 04:00 PM EST RP Workstation: [id number]   US  ASCITES (ABDOMEN LIMITED) Result Date: 07/02/2024 EXAM: LIMITED ABDOMINAL ULTRASOUND FOR ASCITES EVALUATION TECHNIQUE: Limited real-time sonography of all 4  quadrants of the abdomen was performed for evaluation of ascites. COMPARISON: US  Abdomen Limited 03/14/2024; 04/30/2023. CLINICAL HISTORY: Cirrhosis (HCC). FINDINGS: RIGHT UPPER QUADRANT: Moderate volume of ascites noted. LEFT UPPER QUADRANT: Moderate volume of ascites noted. RIGHT LOWER QUADRANT: Moderate volume of ascites noted. LEFT LOWER QUADRANT: Minimal fluid in the left lower quadrant. OTHER: Moderate fluid midline. Limited visualization of the rest of the abdomen demonstrates no acute abnormality. IMPRESSION: 1. Moderate volume of ascites in the right upper quadrant, right lower quadrant, left upper quadrant, and midline. 2.  minimal fluid in the left lower quadrant. Electronically signed by: Norleen Boxer MD 07/02/2024 03:47 PM EST RP Workstation: [id number]   CT Cervical Spine Wo Contrast Result Date: 07/01/2024 EXAM: CT CERVICAL SPINE WITHOUT CONTRAST 07/01/2024 03:13:30 PM TECHNIQUE: CT of the cervical spine was performed without the administration of intravenous contrast. Multiplanar reformatted images are provided for review. Automated exposure control, iterative reconstruction, and/or weight based adjustment of the mA/kV was utilized to reduce the radiation dose to as low  as reasonably achievable. COMPARISON: 06/22/2024 and 02/16/2022 CLINICAL HISTORY: Ataxia, cervical trauma FINDINGS: BONES AND ALIGNMENT: No acute fracture or traumatic malalignment. Focal kyphosis at C5-C6 is stable. Straightening of the normal cervical lordosis otherwise is stable. DEGENERATIVE CHANGES: Chronic endplate changes at C5-C6. SOFT TISSUES: Adjacent soft tissues are unremarkable. Calcifications are again seen within the thyroid  bilaterally. Atherosclerotic calcifications are present in the proximal great vessels and at the carotid bifurcations without definite stenosis. A right IJ catheter is in place. No prevertebral soft tissue swelling. IMPRESSION: 1. No acute cervical spine findings. 2. Stable chronic endplate  changes and focal kyphosis at C5-6. Electronically signed by: Lonni Necessary MD 07/01/2024 03:57 PM EST RP Workstation: [id number]   CT HEAD WO CONTRAST ( ) Result Date: 07/01/2024 EXAM: CT HEAD WITHOUT CONTRAST 07/01/2024 03:13:30 PM TECHNIQUE: CT of the head was performed without the administration of intravenous contrast. Automated exposure control, iterative reconstruction, and/or weight based adjustment of the mA/kV was utilized to reduce the radiation dose to as low as reasonably achievable. COMPARISON: 06/22/2024 CLINICAL HISTORY: Head trauma, abnormal mental status (Age 38-64y). Fall today. FINDINGS: BRAIN AND VENTRICLES: No acute hemorrhage. No evidence of acute infarct. Chronic encephalomalacia within bilateral frontal, parietal, and right occipital lobes compatible with remote infarcts. Extensive periventricular and subcortical white matter low-density changes compatible with chronic microvascular ischemic change. No hydrocephalus. No extra-axial collection. No mass effect or midline shift. ORBITS: No acute abnormality. SINUSES: No acute abnormality. SOFT TISSUES AND SKULL: No acute soft tissue abnormality. No skull fracture. Atherosclerotic calcifications in large vessels of skull base. IMPRESSION: 1. No acute intracranial abnormality. 2. Chronic encephalomalacia within bilateral frontal, parietal, and right occipital lobes compatible with remote infarcts. 3. Extensive periventricular and subcortical white matter low-density changes compatible with chronic microvascular ischemic change. Electronically signed by: Lonni Necessary MD 07/01/2024 03:54 PM EST RP Workstation: [id number]   DG Wrist Complete Right Result Date: 07/01/2024 EXAM: 3 or more VIEW(S) XRAY OF THE RIGHT WRIST 07/01/2024 12:22:33 PM COMPARISON: None available. CLINICAL HISTORY: fall FINDINGS: BONES AND JOINTS: No acute fracture. No malalignment. Subchondral cysts in the base of the first metacarpal bone with degenerative  changes of the first [facility] joint. SOFT TISSUES: Unremarkable. IMPRESSION: 1. No acute fracture or dislocation. Electronically signed by: Waddell Calk MD 07/01/2024 12:51 PM EST RP Workstation: [id number]   DG Pelvis Portable Result Date: 07/01/2024 EXAM: 1 or 2 VIEW(S) XRAY OF THE PELVIS 07/01/2024 12:22:33 PM COMPARISON: 01/13/2016 CLINICAL HISTORY: fall FINDINGS: BONES AND JOINTS: No acute fracture. No malalignment. SOFT TISSUES: Likely calcified uterine fibroids. Severe atherosclerotic plaque. IMPRESSION: 1. No evidence of acute traumatic injury. Electronically signed by: Waddell Calk MD 07/01/2024 12:49 PM EST RP Workstation: [id number]   DG Chest Portable 1 View Result Date: 07/01/2024 EXAM: 1 VIEW(S) XRAY OF THE CHEST 07/01/2024 11:45:00 AM COMPARISON: 06/25/2024 CLINICAL HISTORY: SOB FINDINGS: LINES, TUBES AND DEVICES: Right chest wall dialysis catheter in place with tip overlying right atrium. Left brachial stent noted. LUNGS AND PLEURA: Pulmonary interstitial prominence with mild pulmonary edema. Decreased left pleural effusion. No pneumothorax. HEART AND MEDIASTINUM: Cardiomegaly. Coronary artery calcifications and stents. Surgical changes in mediastinum. Aortic calcification. BONES AND SOFT TISSUES: Intact sternotomy wires. IMPRESSION: 1. Pulmonary interstitial prominence with mild pulmonary edema, similar . 2. Decreased left pleural effusion. 3. Cardiomegaly. Electronically signed by: Waddell Calk MD 07/01/2024 11:55 AM EST RP Workstation: [id number]   DG Chest 1 View Result Date: 06/25/2024 CLINICAL DATA:  Pulmonary edema. EXAM: CHEST  1 VIEW COMPARISON:  02/28/2024 and CT chest 02/16/2022.  FINDINGS: Right IJ dialysis catheter terminates in the right atrium. Trachea is midline. Heart is enlarged. There may be subtle interstitial prominence bilaterally. Probable left pleural effusion. Findings appear unchanged. IMPRESSION: Probable mild interstitial pulmonary edema and small left pleural  effusion. Electronically Signed   By: Newell Eke M.D.   On: 06/25/2024 11:11   ECHOCARDIOGRAM COMPLETE Result Date: 06/23/2024    ECHOCARDIOGRAM REPORT   Patient Name:   DEBY ADGER [facility] Date of Exam: 06/23/2024 Medical Rec #:  [id number]         Height:       66.0 in Accession #:    [id number]        Weight:       164.5 lb Date of Birth:  1960/08/08          BSA:          1.840 m Patient Age:    63 years          BP:           101/73 mmHg Patient Gender: F                 HR:           107 bpm. Exam Location:  [facility] Procedure: 2D Echo, Color Doppler and Cardiac Doppler (Both Spectral and Color            Flow Doppler were utilized during procedure). STAT ECHO Indications:     Syncope R55  History:         Patient has prior history of Echocardiogram examinations, most                  recent 05/17/2023. COPD; Arrythmias:Atrial Fibrillation and                  Atrial Flutter.  Sonographer:     Christopher Furnace Referring Phys:  [id number] ROBET KIM Diagnosing Phys: Dwayne D Callwood MD IMPRESSIONS  1. Left ventricular ejection fraction, by estimation, is 25 to 30%. The left ventricle has severely decreased function. The left ventricle demonstrates global hypokinesis. The left ventricular internal cavity size was severely dilated. Left ventricular diastolic function could not be evaluated.  2. Right ventricular systolic function is severely reduced. The right ventricular size is severely enlarged. Mildly increased right ventricular wall thickness.  3. Left atrial size was mildly dilated.  4. Right atrial size was severely dilated.  5. The mitral valve is normal in structure. Mild mitral valve regurgitation.  6. Tricuspid valve regurgitation is severe.  7. The aortic valve is grossly normal. Aortic valve regurgitation is not visualized. Aortic valve sclerosis is present, with no evidence of aortic valve stenosis. FINDINGS  Left Ventricle: Left ventricular ejection fraction, by estimation, is 25 to 30%. The left ventricle  has severely decreased function. The left ventricle demonstrates global hypokinesis. Strain was performed and the global longitudinal strain is indeterminate. The left ventricular internal cavity size was severely dilated. There is borderline left ventricular hypertrophy. Left ventricular diastolic function could not be evaluated. Right Ventricle: The right ventricular size is severely enlarged. Mildly increased right ventricular wall thickness. Right ventricular systolic function is severely reduced. Left Atrium: Left atrial size was mildly dilated. Right Atrium: Right atrial size was severely dilated. Pericardium: There is no evidence of pericardial effusion. Mitral Valve: The mitral valve is normal in structure. Mild mitral valve regurgitation. Tricuspid Valve: The tricuspid valve is grossly normal. Tricuspid valve regurgitation is severe. Aortic Valve: The aortic valve is  grossly normal. Aortic valve regurgitation is not visualized. Aortic valve sclerosis is present, with no evidence of aortic valve stenosis. Aortic valve mean gradient measures 3.0 mmHg. Aortic valve peak gradient measures 4.4 mmHg. Aortic valve area, by VTI measures 2.08 cm. Pulmonic Valve: The pulmonic valve was not well visualized. Pulmonic valve regurgitation is mild. Aorta: The aortic root was not well visualized. IAS/Shunts: No atrial level shunt detected by color flow Doppler. Additional Comments: 3D was performed not requiring image post processing on an independent workstation and was indeterminate.  LEFT VENTRICLE PLAX 2D LVIDd:         6.02 cm      Diastology LVIDs:         4.64 cm      LV e' medial:  4.64 cm/s LV PW:         1.11 cm      LV e' lateral: 8.32 cm/s LV IVS:        1.31 cm LVOT diam:     2.00 cm LV SV:         28 LV SV Index:   15 LVOT Area:     3.14 cm  LV Volumes (MOD) LV vol d, MOD A2C: 172.0 ml LV vol d, MOD A4C: 173.0 ml LV vol s, MOD A2C: 168.0 ml LV vol s, MOD A4C: 119.0 ml LV SV MOD A2C:     4.0 ml LV SV MOD  A4C:     173.0 ml LV SV MOD BP:      33.8 ml RIGHT VENTRICLE RV Basal diam:  4.52 cm RV Mid diam:    3.20 cm LEFT ATRIUM             Index        RIGHT ATRIUM           Index LA diam:        3.40 cm 1.85 cm/m   RA Area:     28.70 cm LA Vol (A2C):   86.5 ml 47.00 ml/m  RA Volume:   110.00 ml 59.77 ml/m LA Vol (A4C):   67.8 ml 36.84 ml/m LA Biplane Vol: 78.1 ml 42.44 ml/m  AORTIC VALVE AV Area (Vmax):    1.81 cm AV Area (Vmean):   1.64 cm AV Area (VTI):     2.08 cm AV Vmax:           105.00 cm/s AV Vmean:          79.900 cm/s AV VTI:            0.133 m AV Peak Grad:      4.4 mmHg AV Mean Grad:      3.0 mmHg LVOT Vmax:         60.60 cm/s LVOT Vmean:        41.700 cm/s LVOT VTI:          0.088 m LVOT/AV VTI ratio: 0.66  AORTA Ao Root diam: 2.30 cm TRICUSPID VALVE TR Peak grad:   45.7 mmHg TR Vmax:        338.00 cm/s  SHUNTS Systemic VTI:  0.09 m Systemic Diam: 2.00 cm [name] MD Electronically signed by [name] MD Signature Date/Time: 06/23/2024/2:44:50 PM    Final    CT Cervical Spine Wo Contrast Result Date: 06/22/2024 EXAM: CT CERVICAL SPINE WITHOUT CONTRAST 06/22/2024 09:55:46 PM TECHNIQUE: CT of the cervical spine was performed without the administration of intravenous contrast. Multiplanar reformatted images are provided for review.  Automated exposure control, iterative reconstruction, and/or weight based adjustment of the mA/kV was utilized to reduce the radiation dose to as low as reasonably achievable. COMPARISON: None available. CLINICAL HISTORY: Polytrauma, blunt. FINDINGS: BONES AND ALIGNMENT: Reversal of the normal mid cervical lordosis, chronic. No acute fracture or traumatic malalignment. DEGENERATIVE CHANGES: Severe erosive endplate changes at C5-C6, chronic but progressive when compared to remote prior. Differential considerations include chronic osteomyelitis versus severe degenerative changes with bony remodeling. SOFT TISSUES: No prevertebral soft tissue swelling.  IMPRESSION: 1. No traumatic injury to the cervical spine. 2. Severe erosive endplate changes at C5-C6, chronic but progressive. Differential considerations include chronic osteomyelitis versus severe degenerative changes with bony remodeling. Correlate with laboratory evaluation to exclude infection. Electronically signed by: Pinkie Pebbles MD MD 06/22/2024 10:07 PM EST RP Workstation: [id number]   CT HEAD WO CONTRAST ( ) Result Date: 06/22/2024 EXAM: CT HEAD WITHOUT CONTRAST 06/22/2024 09:55:46 PM TECHNIQUE: CT of the head was performed without the administration of intravenous contrast. Automated exposure control, iterative reconstruction, and/or weight based adjustment of the mA/kV was utilized to reduce the radiation dose to as low as reasonably achievable. COMPARISON: MRI brain dated 08/16/2023. CLINICAL HISTORY: Polytrauma, blunt. FINDINGS: BRAIN AND VENTRICLES: No acute hemorrhage. No evidence of acute infarct. Remote infarcts in bilateral frontal lobes and right parietal lobe. Moderate white matter disease. Intracranial atherosclerosis. No hydrocephalus. No extra-axial collection. No mass effect or midline shift. ORBITS: No acute abnormality. SINUSES: No acute abnormality. SOFT TISSUES AND SKULL: No acute soft tissue abnormality. No skull fracture. IMPRESSION: 1. No acute intracranial abnormality. 2. 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 Electronically signed by: Pinkie Pebbles MD MD 06/22/2024 10:03 PM EST RP Workstation: [id number]   DG Chest Portable 1 View Result Date: 06/22/2024 EXAM: 1 VIEW XRAY OF THE CHEST 06/22/2024 09:30:41 PM COMPARISON: 02/28/2024. Prior CAG. CLINICAL HISTORY: weakness FINDINGS: LINES, TUBES AND DEVICES: Right dialysis catheter tip in the right atrium. LUNGS AND PLEURA: Vascular congestion. No overt edema. No focal pulmonary opacity. No pleural effusion. No pneumothorax. HEART AND MEDIASTINUM: Cardiomegaly. BONES AND SOFT TISSUES: No acute osseous abnormality. IMPRESSION:  1. Cardiomegaly with vascular congestion without overt edema. Electronically signed by: Franky Crease MD 06/22/2024 09:35 PM EST RP Workstation: [id number]    EKG: Sinus rhythm with PVCs  ASSESSMENT AND PLAN:  A-fib with RVR, now in sinus rhythm V. tach/V-fib arrest CAD Chronic heart failure with reduced EF ESRD on dialysis Anemia  Remains in sinus rhythm with PVCs. Continue p.o. amiodarone . Can change heparin  drip to Eliquis  if no plans for any procedure Continue midodrine  with soft blood pressure Volume status management with dialysis Overall poor prognosis.  Continue medical therapy based on prior discussions.  She is DNR.  Signed: Keller JAYSON Paterson MD 07/14/2024, 8:20 AM

## 2024-07-14 NOTE — Care Management Important Message (Signed)
 Important Message  Patient Details  Name: Vanessa BOHNSACK MRN: [id number] Date of Birth: [birth year]   Important Message Given:  Yes - Medicare IM     Caniyah Murley 07/14/2024, 1:56 PM

## 2024-07-14 NOTE — Progress Notes (Signed)
 PHARMACY - ANTICOAGULATION CONSULT NOTE  Pharmacy Consult for UFH infusion Indication: atrial fibrillation  Allergies[1]  Patient Measurements: Height: 5' 6 (167.6 cm) Weight: 78.4 kg (172 lb 13.5 oz) IBW/kg (Calculated) : 59.3 HEPARIN  DW (KG): 70.5  Labs: Recent Labs    07/12/24 0411 07/12/24 0759 07/13/24 0437 07/14/24 0445 07/14/24 0530  HGB 9.9*  --  9.8* 9.8*  --   HCT 30.1*  --  30.7* 31.0*  --   PLT 134*  --  136* 160  --   HEPARINUNFRC  --  0.48 0.33  --  0.41  CREATININE 3.22*  --  3.93* 3.00*  --     Estimated Creatinine Clearance: 20.8 mL/min (A) (by C-G formula based on SCr of 3 mg/dL (H)).   Medical History: Past Medical History:  Diagnosis Date   Acute lacunar infarction (HCC) 03/03/2014   a.) MRI brain 03/03/2014: two acute punctate lacunar infarcts anteriorly in the BILATERAL centrum semiovale   Adult behavior problems    Anemia of chronic renal failure    Aortic atherosclerosis    Arthritis    Atrial fibrillation and flutter (HCC)    a.) CHA2DS2VASc = 6 (sex, HFrEF, HTN, CVA x 2, prior MI/vascular disease) as of 02/04/2024; b.) rate/rhythm maintained on oral metoprolol  succinate; chronically anticoagulated with apixaban    Bipolar disorder (HCC)    Cardiomegaly    Cerebral microvascular disease    CHF (congestive heart failure) (HCC)    Chicken pox    Cholelithiasis    Chronic respiratory failure with hypoxia (HCC) 12/19/2021   COPD (chronic obstructive pulmonary disease) (HCC)    Coronary artery disease    DDD (degenerative disc disease), cervical    DDD (degenerative disc disease), thoracolumbar    Diverticulosis    Dyspnea    ESRD (end stage renal disease) on dialysis (M,W,F)    GERD (gastroesophageal reflux disease)    Gout    HCV (hepatitis C virus)    Headache    HFrEF (heart failure with reduced ejection fraction) (HCC)    History of delirium 04/12/2014   History of substance abuse (HCC)    a.) tobacco + cocaine   Hyperkalemia  11/13/2014   Hyperlipidemia    Hypertension    Hypotension 02/24/2014   Hypothyroidism    Ischemic cardiomyopathy    Nose colonized with MRSA 02/01/2024   a.) presurgical PCR (+) on 02/01/2024 prior to A/V FISTULAGRAM; b.) presurgical PCR (+) on 03/07/2024 prior to AV FISTULA CREATION   NSTEMI (non-ST elevated myocardial infarction) (HCC) 10/08/2013   NSTEMI (non-ST elevated myocardial infarction) (HCC) 01/23/2002   a.) LHC 01/24/2002 --> multi-vessel CAD with IRA being 90% OM2 --> delayed PCI until 01/25/2002 --> 3.0 x 23 mm BX Velocity stent   Obesity    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    On apixaban  therapy    Peripheral vascular disease    Pneumonia    Postoperative anemia due to acute blood loss 02/27/2014   Postoperative cerebrovascular infarction following cardiac surgery 02/21/2014   a.) developed RIGHT upper/lower extremity weakness following cardiac revascularization --> neuro consult and head CT --> Hypodensity noted in the area of the LEFT motor cortex consistent with a component of acute ischemia   Pulmonary hypertension (HCC)    RBBB (right bundle branch block)    Renal osteodystrophy    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 Small focus of restricted diffusion in the cortex of the medial LEFT frontoparietal lobe. Periventricular T2 and multiple foci of T2 hyperintensity in the subcortical white matter and increased FLAIR signal in the cortex of the LEFT frontal lobe.   Syncope and collapse 07/25/2020   Assessment: 64 y/o F with a h/o atrial fibrillation on apixaban . Last dose was 07-27-2024 at 0900. Patient coded on July 27, 2024 with subsequent ROSC and is now in the ICU on ventilation and pressor support. Pharmacy consulted for IV heparin .   Goal of Therapy:  anti-Xa level  0.3-0.7  units/ml Monitor platelets by anticoagulation protocol: Yes   Plan:  1/30:  HL @ 0530 = 0.41, therapeutic X 8 --Continue heparin  infusion at 1450 units/hr --Re-check HL daily w/ AM labs while therapeutic --Daily CBC  Mirriam Vadala D 07/14/2024 6:33 AM      [1]  Allergies Allergen Reactions   Morphine  And Codeine Hives   Levaquin [Levofloxacin] Itching and Other (See Comments)    Severe itching; prickly sensation; severe left leg pain; immobility.   Enalapril  Other (See Comments)    hallucinations   Latex Rash   Tape Rash and Other (See Comments)

## 2024-07-14 NOTE — Progress Notes (Addendum)
 " [facility]  PROGRESS NOTE   Subjective:   Patient resting quietly  No family present Tracking in room, no verbal response 2L [place]  Objective:  Vital signs: Blood pressure (!) 95/41, pulse (!) 56, temperature 98.2 F (36.8 C), temperature source Axillary, resp. rate 20, height 5' 6 (1.676 m), weight 78.4 kg, SpO2 95%.  Intake/Output Summary (Last 24 hours) at 07/14/2024 1247 Last data filed at 07/14/2024 1219 Gross per 24 hour  Intake 180 ml  Output 0 ml  Net 180 ml   Filed Weights   07/13/24 0450 07/13/24 1044 07/14/24 0500  Weight: 79.9 kg 79.9 kg 78.4 kg     Physical Exam: General:  No acute distress  Head:  Normocephalic, atraumatic.    Eyes:  Anicteric  Lungs:   Clear to auscultation, normal effort  Heart:  S1S2 no rubs  Abdomen:   Soft, distended  Extremities:  + peripheral edema.  Neurologic:  Awake but confused  Skin:  Left foot wound  Access:  Right femoral dialysis catheter    Basic Metabolic Panel: Recent Labs  Lab 07/08/24 0428 07/09/24 0425 07/10/24 9587 07/11/24 0425 07/12/24 0411 07/13/24 0437 07/14/24 0445  NA 130*   < > 126* 126* 130* 130* 133*  K 3.7   < > 4.1 4.8 3.8 4.0 3.8  CL 89*   < > 86* 86* 89* 89* 92*  CO2 26   < > 21* 20* 25 24 26   GLUCOSE 97   < > 106* 83 80 104* 97  BUN 48*   < > 94* 101* 51* 63* 48*  CREATININE 2.51*   < > 4.01* 4.86* 3.22* 3.93* 3.00*  CALCIUM  9.9   < > 10.7* 11.2* 10.0 10.3 9.9  MG 2.1  --   --  2.5* 2.2 2.2 2.1  PHOS 2.2*   < > 3.6 5.2* 4.0 4.9* 3.3   < > = values in this interval not displayed.   GFR: Estimated Creatinine Clearance: 20.8 mL/min (A) (by C-G formula based on SCr of 3 mg/dL (H)).  Liver Function Tests: Recent Labs  Lab 07/09/24 0425 07/10/24 0412  ALBUMIN  2.8* 2.9*   No results for input(s): LIPASE, AMYLASE in the last 168 hours. No results for input(s): AMMONIA in the last 168 hours.   CBC: Recent Labs  Lab 07/10/24 0412 07/11/24 0425 07/12/24 0411  07/13/24 0437 07/14/24 0445  WBC 12.4* 13.9* 12.7* 12.0* 13.4*  HGB 10.2* 10.2* 9.9* 9.8* 9.8*  HCT 29.1* 30.4* 30.1* 30.7* 31.0*  MCV 86.6 89.9 91.5 95.0 96.3  PLT 100* 117* 134* 136* 160     HbA1C: No results found for: HGBA1C  Urinalysis: No results for input(s): COLORURINE, LABSPEC, PHURINE, GLUCOSEU, HGBUR, BILIRUBINUR, KETONESUR, PROTEINUR, UROBILINOGEN, NITRITE, LEUKOCYTESUR in the last 72 hours.  Invalid input(s): APPERANCEUR    Imaging: No results found.      Medications:    anticoagulant sodium citrate      feeding supplement (OSMOLITE 1.5 CAL) 1,000 mL (07/13/24 2314)   [START ON 07/15/2024] sodium thiosulfate  25 g in sodium chloride  0.9 % 200 mL Infusion for Calciphylaxis     [START ON 07/15/2024] vancomycin       amiodarone   200 mg Per Tube TID   [START ON 07/18/2024] amiodarone   200 mg Oral Daily   apixaban   5 mg Oral BID   Chlorhexidine  Gluconate Cloth  6 each Topical Daily   collagenase   1 Application Topical Daily   free water   30 mL Per Tube Q4H  levothyroxine   75 mcg Per Tube Q0600   midodrine   15 mg Per Tube TID WC   multivitamin  1 tablet Per Tube QHS   nutrition supplement (JUVEN)  1 packet Per Tube BID BM   sodium chloride  flush  10-40 mL Intracatheter Q12H    Assessment/ Plan:     64 year old female with history of coronary disease history of CABG, ESRD, COPD, atrial fibrillation on hemodialysis Monday Wednesday and Friday now admitted with history of syncope/fall.  In hospital cardiac arrest on Sunday, 07/02/2024 Outpatient dialysis: [facility]/MWF/right PermCath/72.5 kg    ESRD: HD Monday Wednesday and Friday.  Dialysis received yesterday, UF 0ml. Next treatment scheduled for Saturday. Will transition to outpatient schedule next week.  ANEMIA of chronic kidney disease: Hemoglobin stable, 9.8.  Retacrit  remains held   MBD (secondary hyperparathyroidism): Maintain the patient on sodium thiosulfate   for calciphylaxis.   Hypotension/VOL/edema: Patient with A-fib with RVR.  Currently on heparin  drip and maintained on oral amiodarone .  Required pressors during this admission. Blood pressure remains soft, for this patient.   Acute respiratory failure-requiring ventilator postcardiac arrest on 07/02/2024.  Status post extubation 07/11/2024. On 2L [place]      LOS: 12 Faith Harris, MD [facility] 1/30/202612:47 PM  "

## 2024-07-14 NOTE — Progress Notes (Signed)
 " PROGRESS NOTE    [name]  FMW:[id number] DOB: June 17, 1960 DOA: 07/01/2024 PCP: Orlean Alan HERO, FNP  [id number]  LOS: 12 days   Brief hospital course:   Assessment & Plan: [name] is a 64 y.o. female with a pmh of of ESRD on HD (MWF), COPD, CHF, bipolar disorder, chronic hypotension on midodrine , HLD and prior CVA who presented to [facility] ED via EMS following syncopal episode.   07/02/24-transferred to ICU post CODE BLUE on medical floor status post ACLS with ROSC  07/03/24- overnight, unresposive with AF. MWF(HD). Will meet with family. Will continue to monitor and re-evaluate post dialysis.  07/04/24- patient had few episodes of VT. She is DNR code status.  She was seen by cardiology, she has poor prognosis. No procedures are planned.  07/05/24- patient had hypotensive episode during HD so it was interuppted.  The lidocaine  infusion will be dcd today and amiodarone  will continue.  Plan for paracentesis today. 07/06/24- patient with no events overnight, did have a non sustained run of VT similar to previous.  Performing awakening trial today.  Due to staph/strep bacteremia the tunnelled vascCath has to come out.  07/07/24 - started PO amiodarone , MRI brain unremarkable 07/08/24 - more awake and interactive 07/09/24 - intubated and off sedation, failed WUA 07/10/24 - more awake, following commands, extubated 07/11/24 - HD catheter placed, triple lumen removed from left groin 07/12/24 - off vasopressin  07/13/24 transferred to [facility]   #Cardiac Arrest  #Ventricular Fibrillation --cardio consulted --cont amiodarone   #CAD  #Ischemic Cardiomyopathy  Hypotension --cont midodrine  --small bolus 250 ml for systolic <80's  #Septic Shock 2/2 Strep gordonii bacteremia- MRSA bacteremia --HD cath removed  Repeat blood cultrue X 2 NG 2 d echo no vegetations valves --cont IV vanc, will give for 4 weeks total 07/31/24 during dialysis  #Afib  --cont amiodarone  --cont  Eliquis   Acute metabolic encephalopathy  Likely hospital delirium --extubated with improvement in mental status, but not quite back to her baseline. MRI brain without acute pathology.   Chronic combined CHF --LVEF 25% and a dysfunctional and enlarged right ventricle.  --management per cardio   Acute respiratory failure  --in the setting of cardiac arrest, now extubated as of 07/10/24 and currently on nasal cannula.   Ascites  s/p paracentesis- possible cirrhosis could be cardiac cirrhosis Cell coulnt 605 ( 44% N) Protein not sent Culture neg   ESRD on HD  --had a tunneled dialysis catheter. With concern for bacteremia this was explanted. Placed temp HD catheter in the right groin. --iHD per nephro --PermCath next week  Hyponatremia --now stable in low 130's     DVT prophylaxis: On:Eliquis  Code Status: DNR  Family Communication: family updated at bedside Level of care: Progressive Dispo:   The patient is from: home Anticipated d/c is to: Sn[facility] rehab Anticipated d/c date is: after PermCath placed   Subjective and Interval History:  Pt's both arms were weak.    Passed swallow eval, resumed oral diet while keeping nocturnal tube feeding.   Objective: Vitals:   07/14/24 1919 07/14/24 1934 07/14/24 1949 07/14/24 2004  BP: (!) 115/55 (!) 110/59 (!) 111/59 (!) 107/54  Pulse: 89 (!) 56 (!) 55 (!) 57  Resp: 17 15 20 19   Temp:      TempSrc:      SpO2: 98%  100% 100%  Weight:      Height:        Intake/Output Summary (Last 24 hours) at 07/14/2024 2019 Last data filed  at 07/14/2024 1608 Gross per 24 hour  Intake 2019.58 ml  Output --  Net 2019.58 ml   Filed Weights   07/13/24 0450 07/13/24 1044 07/14/24 0500  Weight: 79.9 kg 79.9 kg 78.4 kg    Examination:   Constitutional: NAD, more alert and responsive, but still somewhat confused HEENT: conjunctivae and lids normal, EOMI CV: No cyanosis.   RESP: normal respiratory effort, on 1.5L O2 Extremities: edema in  BUE SKIN: warm, dry   Data Reviewed: I have personally reviewed labs and imaging studies  Time spent: 50 minutes  Ellouise Haber, MD [facility] If 7PM-7AM, please contact night-coverage 07/14/2024, 8:19 PM   "

## 2024-07-14 NOTE — Progress Notes (Signed)
 PHARMACY CONSULT NOTE FOR:  OUTPATIENT  PARENTERAL ANTIBIOTIC THERAPY (OPAT)  Indication: MRSA Bacteremia Regimen: Vancomycin  750mg  IV qHD on Mon/Wed/Fri End date: 08/02/2024  Labs - weekly:  CBC/D, CMP, and pre-HD vancomycin  level Fax weekly lab results  promptly to [phone] Call [phone] with any questions  IV antibiotic discharge orders are pended. To discharging provider:  please sign these orders via discharge navigator,  Select New Orders & click on the button choice - Manage This Unsigned Work.     Thank you for allowing pharmacy to be a part of this patient's care.  Colin Ellers, PharmD, BCPS, BCIDP Work Cell: [phone] 07/14/2024 12:17 PM

## 2024-07-14 NOTE — TOC Progression Note (Signed)
 Transition of Care [facility]) - Progression Note    Patient Details  Name: [name] MRN: [id number] Date of Birth: June 08, 1961  Transition of Care [facility]) CM/SW Contact  Lauraine JAYSON Carpen, LCSW Phone Number: 07/14/2024, 1:57 PM  Clinical Narrative:   CSW met with patient. Brother at bedside. CSW introduced role and explained that therapy recommendations would be discussed. Patient and her brother are agreeable to SNF placement with plan to return home after rehab. She went to [facility] 12/26-1/2 (7 days). [facility] is first preference, [facility] is second preference. Will wait to send out referral until there is a plan to remove NG. No further concerns. CSW will continue to follow patient and her brother for support and facilitate discharge to SNF once medically stable.  Expected Discharge Plan: Home w Home Health Services Barriers to Discharge: Continued Medical Work up               Expected Discharge Plan and Services       Living arrangements for the past 2 months: Single Family Home                           HH Arranged: RN, PT, OT [facility] Agency: [facility] Date [facility] Agency Contacted: 07/06/24   Representative spoke with at [facility] Agency: [place]   Social Drivers of Health (SDOH) Interventions SDOH Screenings   Food Insecurity: No Food Insecurity (07/02/2024)  Housing: Low Risk (07/02/2024)  Transportation Needs: No Transportation Needs (07/02/2024)  Utilities: Not At Risk (07/02/2024)  Depression (PHQ2-9): Low Risk (07/06/2023)  Financial Resource Strain: Low Risk  (05/31/2024)   Received from [facility]  Tobacco Use: High Risk (07/01/2024)    Readmission Risk Interventions    07/06/2024   11:55 AM 06/26/2024   12:20 PM 12/23/2021   11:41 AM  Readmission Risk Prevention Plan  Transportation Screening Complete Complete Complete  PCP or Specialist Appt within 3-5 Days  Complete   HRI or Home Care Consult   Complete Complete  Social Work Consult for Recovery Care Planning/Counseling  Complete Complete  Palliative Care Screening  Not Applicable Not Applicable  Medication Review Oceanographer) Complete Complete Complete  PCP or Specialist appointment within 3-5 days of discharge Complete    HRI or Home Care Consult Complete    SW Recovery Care/Counseling Consult Complete    Palliative Care Screening Not Applicable    Skilled Nursing Facility Not Applicable

## 2024-07-14 NOTE — Progress Notes (Signed)
Pre Hd 

## 2024-07-14 NOTE — Progress Notes (Signed)
 Occupational Therapy Treatment Patient Details Name: [name] MRN: [id number] DOB: [birth year] Today's Date: 07/14/2024   History of present illness 64 y/o female presented to ED on 07/01/24 for weakness and fall. Recent admit 1/8-1/14 for fluid overload and cardiogenic shock with syncope and collapse. Cardiac arrest on 1/18. Intubated 1/18-1/26.  PMH: COPD, ESRD on HD, CHF, bipolar disorder, chronic hypotension on midodrine , prior CVA, PVD, CAD s/p CABG x3, Afib on Eliquis    OT comments  Chart reviewed to date, pt greeted semi supine in bed, agreeable to OT tx session targeting improving functional activity tolerance in prep for ADL tasks. +2 required for all mobility. Pt sits on edge of bed with CGA, STS with TOTAL A, TOTAL A for peri care due to incontinent BM. R torticollis noted, improved tolerance for self ROM and PROM but unable to tolerate any type of positioning at midline. Left pt in bed with pillows supporting towards midline. Sacral wounds noted to be bleeding, notified nurse/MD and requested air loss bed. Pt left offloaded with pillow on L hip. VSS with MAP >65 pre session, pt does report some dizziness on edge of bed after STS attempts. Pt is making progress towards goals, OT will continue to follow.   NG paused for mobility, NG, [place], IV, Fem HD access all intact pre/post session       If plan is discharge home, recommend the following:  Two people to help with walking and/or transfers;Two people to help with bathing/dressing/bathroom;Supervision due to cognitive status   Equipment Recommendations   (defer to next venue of care)    Recommendations for Other Services      Precautions / Restrictions Precautions Precautions: Fall Recall of Precautions/Restrictions: Impaired Restrictions Weight Bearing Restrictions Per Provider Order: No       Mobility Bed Mobility Overal bed mobility: Needs Assistance Bed Mobility: Supine to Sit, Sit to Supine, Rolling Rolling: Total  assist, +2 for physical assistance, Used rails   Supine to sit: Total assist, +2 for physical assistance, +2 for safety/equipment Sit to supine: Total assist, +2 for physical assistance, +2 for safety/equipment        Transfers Overall transfer level: Needs assistance Equipment used: Rolling walker (2 wheels) Transfers: Sit to/from Stand Sit to Stand: Total assist                 Balance Overall balance assessment: Needs assistance Sitting-balance support: Feet unsupported, Single extremity supported Sitting balance-Leahy Scale: Poor     Standing balance support: During functional activity, Reliant on assistive device for balance Standing balance-Leahy Scale: Zero Standing balance comment: extremely limited                           ADL either performed or assessed with clinical judgement   ADL Overall ADL's : Needs assistance/impaired             Lower Body Bathing: Total assistance;Sit to/from stand;Bed level       Lower Body Dressing: Total assistance;Bed level       Toileting- Clothing Manipulation and Hygiene: Total assistance;Sit to/from stand Toileting - Clothing Manipulation Details (indicate cue type and reason): incontinent BM            Extremity/Trunk Assessment Upper Extremity Assessment Upper Extremity Assessment:  (unable to actively flex B shoulders on this date, BUE edema +2 in B hands)       Cervical / Trunk Assessment Cervical / Trunk Exceptions: Pt with lateral head  tilt to the R with chin towards shoulder- R torticollis; unable to tolerate sustained stretch to midline, positioned with head more towards neutral    Vision       Perception     Praxis     Communication Communication Communication: Impaired Factors Affecting Communication: Difficulty expressing self;Reduced clarity of speech   Cognition Arousal: Alert Behavior During Therapy: Flat affect Cognition: Cognition impaired   Orientation impairments:  Time, Situation Awareness: Intellectual awareness impaired, Online awareness impaired Memory impairment (select all impairments): Declarative long-term memory Attention impairment (select first level of impairment): Focused attention Executive functioning impairment (select all impairments): Sequencing, Reasoning, Problem solving                   Following commands: Impaired Following commands impaired: Follows one step commands with increased time (improved from eval, however continues to be impaired)      Cueing   Cueing Techniques: Verbal cues, Tactile cues, Visual cues  Exercises Other Exercises Other Exercises: edu re role of OT, role of rehab, discharge recommendations    Shoulder Instructions       General Comments bleeding wounds from sacrum, notified nurse    Pertinent Vitals/ Pain       Pain Assessment Pain Assessment: Faces Faces Pain Scale: Hurts little more Pain Location: neck Pain Descriptors / Indicators: Discomfort, Sore Pain Intervention(s): Monitored during session, Repositioned  Home Living                                          Prior Functioning/Environment              Frequency  Min 2X/week        Progress Toward Goals  OT Goals(current goals can now be found in the care plan section)  Progress towards OT goals: Progressing toward goals  Acute Rehab OT Goals Time For Goal Achievement: 07/26/24  Plan      Co-evaluation    PT/OT/SLP Co-Evaluation/Treatment: Yes Reason for Co-Treatment: For patient/therapist safety;To address functional/ADL transfers;Complexity of the patient's impairments (multi-system involvement)   OT goals addressed during session: ADL's and self-care      AM-PAC OT 6 Clicks Daily Activity     Outcome Measure   Help from another person eating meals?: Total Help from another person taking care of personal grooming?: Total Help from another person toileting, which includes using  toliet, bedpan, or urinal?: Total Help from another person bathing (including washing, rinsing, drying)?: Total Help from another person to put on and taking off regular upper body clothing?: Total Help from another person to put on and taking off regular lower body clothing?: Total 6 Click Score: 6    End of Session Equipment Utilized During Treatment: Oxygen   OT Visit Diagnosis: Unsteadiness on feet (R26.81);Repeated falls (R29.6);Muscle weakness (generalized) (M62.81)   Activity Tolerance Patient limited by pain   Patient Left in bed;with call bell/phone within reach;with bed alarm set   Nurse Communication Mobility status        Time: 8866-8786 OT Time Calculation (min): 40 min  Charges: OT General Charges $OT Visit: 1 Visit OT Treatments $Therapeutic Activity: 8-22 mins  Therisa Sheffield, OTD OTR/L  07/14/24, 1:40 PM

## 2024-07-15 DIAGNOSIS — R55 Syncope and collapse: Secondary | ICD-10-CM | POA: Diagnosis not present

## 2024-07-15 DIAGNOSIS — M4642 Discitis, unspecified, cervical region: Secondary | ICD-10-CM

## 2024-07-15 LAB — BASIC METABOLIC PANEL WITH GFR
Anion gap: 14 (ref 5–15)
BUN: 37 mg/dL — ABNORMAL HIGH (ref 8–23)
CO2: 27 mmol/L (ref 22–32)
Calcium: 9.9 mg/dL (ref 8.9–10.3)
Chloride: 93 mmol/L — ABNORMAL LOW (ref 98–111)
Creatinine, Ser: 2.4 mg/dL — ABNORMAL HIGH (ref 0.44–1.00)
GFR, Estimated: 22 mL/min — ABNORMAL LOW
Glucose, Bld: 107 mg/dL — ABNORMAL HIGH (ref 70–99)
Potassium: 3.9 mmol/L (ref 3.5–5.1)
Sodium: 134 mmol/L — ABNORMAL LOW (ref 135–145)

## 2024-07-15 LAB — CULTURE, BLOOD (ROUTINE X 2)
Culture: NO GROWTH
Culture: NO GROWTH
Special Requests: ADEQUATE
Special Requests: ADEQUATE

## 2024-07-15 LAB — CBC
HCT: 29.9 % — ABNORMAL LOW (ref 36.0–46.0)
Hemoglobin: 9.6 g/dL — ABNORMAL LOW (ref 12.0–15.0)
MCH: 30.6 pg (ref 26.0–34.0)
MCHC: 32.1 g/dL (ref 30.0–36.0)
MCV: 95.2 fL (ref 80.0–100.0)
Platelets: 142 10*3/uL — ABNORMAL LOW (ref 150–400)
RBC: 3.14 MIL/uL — ABNORMAL LOW (ref 3.87–5.11)
RDW: 21.9 % — ABNORMAL HIGH (ref 11.5–15.5)
WBC: 12.1 10*3/uL — ABNORMAL HIGH (ref 4.0–10.5)
nRBC: 0.4 % — ABNORMAL HIGH (ref 0.0–0.2)

## 2024-07-15 LAB — COMPREHENSIVE METABOLIC PANEL WITH GFR
ALT: 20 U/L (ref 0–44)
AST: 21 U/L (ref 15–41)
Albumin: 2.9 g/dL — ABNORMAL LOW (ref 3.5–5.0)
Alkaline Phosphatase: 82 U/L (ref 38–126)
Anion gap: 12 (ref 5–15)
BUN: 37 mg/dL — ABNORMAL HIGH (ref 8–23)
CO2: 28 mmol/L (ref 22–32)
Calcium: 9.8 mg/dL (ref 8.9–10.3)
Chloride: 94 mmol/L — ABNORMAL LOW (ref 98–111)
Creatinine, Ser: 2.41 mg/dL — ABNORMAL HIGH (ref 0.44–1.00)
GFR, Estimated: 22 mL/min — ABNORMAL LOW
Glucose, Bld: 110 mg/dL — ABNORMAL HIGH (ref 70–99)
Potassium: 3.9 mmol/L (ref 3.5–5.1)
Sodium: 134 mmol/L — ABNORMAL LOW (ref 135–145)
Total Bilirubin: 0.8 mg/dL (ref 0.0–1.2)
Total Protein: 6.5 g/dL (ref 6.5–8.1)

## 2024-07-15 LAB — GLUCOSE, CAPILLARY
Glucose-Capillary: 82 mg/dL (ref 70–99)
Glucose-Capillary: 113 mg/dL — ABNORMAL HIGH (ref 70–99)
Glucose-Capillary: 109 mg/dL — ABNORMAL HIGH (ref 70–99)
Glucose-Capillary: 117 mg/dL — ABNORMAL HIGH (ref 70–99)
Glucose-Capillary: 91 mg/dL (ref 70–99)
Glucose-Capillary: 104 mg/dL — ABNORMAL HIGH (ref 70–99)
Glucose-Capillary: 104 mg/dL — ABNORMAL HIGH (ref 70–99)

## 2024-07-15 LAB — TROPONIN T, HIGH SENSITIVITY: Troponin T High Sensitivity: 968 ng/L (ref 0–19)

## 2024-07-15 LAB — MAGNESIUM: Magnesium: 2.1 mg/dL (ref 1.7–2.4)

## 2024-07-15 LAB — PHOSPHORUS: Phosphorus: 2.8 mg/dL (ref 2.5–4.6)

## 2024-07-15 MED ORDER — HYDROMORPHONE HCL 1 MG/ML IJ SOLN
0.5000 mg | Freq: Once | INTRAMUSCULAR | Status: AC
  Administered 2024-07-15: 0.5 mg via INTRAVENOUS
  Filled 2024-07-15: qty 0.5

## 2024-07-15 MED ORDER — LIDOCAINE 5 % EX PTCH
1.0000 | MEDICATED_PATCH | CUTANEOUS | Status: AC
  Administered 2024-07-15 – 2024-07-21 (×6): 1 via TRANSDERMAL
  Filled 2024-07-15 (×8): qty 1

## 2024-07-15 NOTE — Progress Notes (Addendum)
 Neurosurgery Progress Note  History: [name] is here for syncope.   07/15/2024 - patient is now stable.  Complains of weakness.    Physical Exam: Vitals:   07/15/24 0534 07/15/24 0754  BP: 103/64 (!) 94/49  Pulse: (!) 56 (!) 54  Resp: 20   Temp: 97.7 F (36.5 C) 98.1 F (36.7 C)  SpO2: 100% 100%   Torticollis with head rotate to R AA Ox3 CNI  Strength:  3/5 BUE and BLE, sensation intact  Data:  Other tests/results: CT documented previously  Assessment/Plan:  [name] has some symptoms concerning for possible cervical myelopathy.  She has torticollis likely due to the findings on her CT scan, which are consistent with discitis.  Unfortunately, she is not medically appropriate for the extent of intervention that would be required to fix her cervical deformity.  I will order a soft cervical collar to help with her pain.  If palliative care has not been engaged, I would recommend considering palliative care as she has multiple significant medical problems and is unlikely to become a candidate for intervention on her neck due to these medical problems.  If she is felt to be medically appropriate for an extensive anterior and posterior reconstruction of her neck, would obtain MRI of the C spine.   Vanessa Daisy MD, [facility] Department of Neurosurgery

## 2024-07-15 NOTE — Progress Notes (Signed)
 Patient ID: [name], female   DOB: 09/01/1960, 64 y.o.   MRN: [id number] [facility]    SUBJECTIVE: More alert denies any pain still has some shortness of breath no palpitation tachycardia still has significant weakness and fatigue.  Still has discitis followed by neurosurgery   Vitals:   07/15/24 0437 07/15/24 0500 07/15/24 0534 07/15/24 0754  BP: (!) 96/52  103/64 (!) 94/49  Pulse:   (!) 56 (!) 54  Resp: 20  20   Temp: 98.4 F (36.9 C)  97.7 F (36.5 C) 98.1 F (36.7 C)  TempSrc:    Oral  SpO2:   100% 100%  Weight:  80 kg    Height:         Intake/Output Summary (Last 24 hours) at 07/15/2024 1111 Last data filed at 07/15/2024 1059 Gross per 24 hour  Intake 2079.58 ml  Output 0.5 ml  Net 2079.08 ml      PHYSICAL EXAM  General: Well developed, well nourished, in no acute distress HEENT:  Normocephalic and atramatic Neck:  No JVD.  Lungs: Clear bilaterally to auscultation and percussion. Heart: HRRR . Normal S1 and S2 without gallops or murmurs.  Abdomen: Bowel sounds are positive, abdomen soft and non-tender  Msk:  Back normal, normal gait. Normal strength and tone for age. Extremities: No clubbing, cyanosis or edema.   Neuro: Alert and oriented X 3. Psych:  Good affect, responds appropriately   LABS: Basic Metabolic Panel: Recent Labs    07/14/24 0445 07/15/24 0526 07/15/24 0600  NA 133* 134* 134*  K 3.8 3.9 3.9  CL 92* 93* 94*  CO2 26 27 28   GLUCOSE 97 107* 110*  BUN 48* 37* 37*  CREATININE 3.00* 2.40* 2.41*  CALCIUM  9.9 9.9 9.8  MG 2.1 2.1  --   PHOS 3.3 2.8  --    Liver Function Tests: Recent Labs    07/15/24 0600  AST 21  ALT 20  ALKPHOS 82  BILITOT 0.8  PROT 6.5  ALBUMIN  2.9*   No results for input(s): LIPASE, AMYLASE in the last 72 hours. CBC: Recent Labs    07/14/24 0445 07/15/24 0600  WBC 13.4* 12.1*  HGB 9.8* 9.6*  HCT 31.0* 29.9*  MCV 96.3 95.2  PLT 160 142*   Cardiac Enzymes: No results for input(s):  CKTOTAL, CKMB, CKMBINDEX, TROPONINI in the last 72 hours. BNP: Invalid input(s): POCBNP D-Dimer: No results for input(s): DDIMER in the last 72 hours. Hemoglobin A1C: No results for input(s): HGBA1C in the last 72 hours. Fasting Lipid Panel: No results for input(s): CHOL, HDL, LDLCALC, TRIG, CHOLHDL, LDLDIRECT in the last 72 hours. Thyroid  Function Tests: No results for input(s): TSH, T4TOTAL, T3FREE, THYROIDAB in the last 72 hours.  Invalid input(s): FREET3 Anemia Panel: No results for input(s): VITAMINB12, FOLATE, FERRITIN, TIBC, IRON, RETICCTPCT in the last 72 hours.  No results found.   Echo failure depressed left ventricular function EF between 20 -25%  TELEMETRY: Sinus rhythm bundle branch block: Rate of 60  ASSESSMENT AND PLAN:  Principal Problem:   Syncope and collapse Active Problems:   ESRD on hemodialysis (HCC)   Chronic hypotension   S/P CABG x 3   Mild dementia associated with other underlying disease, without behavioral disturbance, psychotic disturbance, mood disturbance, or anxiety (HCC)   Anemia of chronic disease   ESRD (end stage renal disease) (HCC)   Paroxysmal atrial fibrillation (HCC)   Hypothyroidism   Chronic combined systolic and diastolic CHF (congestive heart failure) (HCC)  Hepatitis C antibody positive in blood   Other cirrhosis of liver (HCC)   Hypertension   Bipolar disorder in full remission, most recent episode unspecified type   Chronic pain syndrome   Other secondary kyphosis, cervical region   Cervical spondylosis   Fall   Acute pulmonary edema (HCC)   Bacteremia due to Streptococcus   MRSA bacteremia   ESRD (end stage renal disease) on dialysis (HCC)   Acute on chronic respiratory failure with hypoxia (HCC)   Acute on chronic HFrEF (heart failure with reduced ejection fraction) (HCC)   Cardiac arrest with ventricular fibrillation (HCC)   Toxic metabolic encephalopathy   Pressure  injury of skin   Discitis of cervical region    Plan Atrial fibrillation now in sinus rhythm heparin  to Eliquis  for anticoagulation amiodarone  for rhythm End-stage renal disease on dialysis continue dialysis management and care as per nephrology HFrEF severely depressed left ventricular function continue GDMT as tolerated for heart failure management Anemia probably related to chronic disease and renal failure continue current therapy Hypotension will continue midodrine  as necessary for blood pressure support Patient more alert more awake today overall prognosis is relatively poor Recommend palliative care goals of care discussion   [name], MD, 07/15/2024 11:11 AM

## 2024-07-15 NOTE — Progress Notes (Signed)
"  ° °      Overnight   NAME: [name] MRN: [id number] DOB : 19-May-1961    Date of Service   07/15/2024   HPI/Events of Note   HPI:  64 y.o. female with a pmh of of ESRD on HD (MWF), COPD, CHF, bipolar disorder, chronic hypotension on midodrine , HLD and prior CVA who presented to [facility] ED via EMS following syncopal episode. Code on 1/18 ACLS with ROSC.    Overnight:  notified by RN of new onset CP 10/10 sharp in nature.     Interventions/ Plan   EKG- NSR with PVS no ST elevation  Troponin  IV hydromorphone   Lidocaine  patch     Updates     Laneta Gardener- Garmon BSN RN CCRN AGACNP-BC Acute Care Nurse Practitioner [facility] [place]  "

## 2024-07-15 NOTE — Plan of Care (Signed)

## 2024-07-15 NOTE — Progress Notes (Signed)
 " [facility]  PROGRESS NOTE   Subjective:   Patient more alert today Complains of chest soreness, grimaces when touched Tube feeds infusing  Objective:  Vital signs: Blood pressure (!) 94/49, pulse (!) 54, temperature 98.1 F (36.7 C), temperature source Oral, resp. rate 20, height 5' 6 (1.676 m), weight 80 kg, SpO2 100%.  Intake/Output Summary (Last 24 hours) at 07/15/2024 1147 Last data filed at 07/15/2024 1059 Gross per 24 hour  Intake 2079.58 ml  Output 0.5 ml  Net 2079.08 ml   Filed Weights   07/13/24 1044 07/14/24 0500 07/15/24 0500  Weight: 79.9 kg 78.4 kg 80 kg     Physical Exam: General:  No acute distress  Head:  Normocephalic, atraumatic.    Eyes:  Anicteric  Lungs:   Clear to auscultation, normal effort  Heart:  S1S2 no rubs  Abdomen:   Soft, distended  Extremities:  + peripheral edema.  Neurologic:  Awake but confused  Skin:  Left foot wound  Access:  Right femoral dialysis catheter    Basic Metabolic Panel: Recent Labs  Lab 07/11/24 0425 07/12/24 0411 07/13/24 0437 07/14/24 0445 07/15/24 0526 07/15/24 0600  NA 126* 130* 130* 133* 134* 134*  K 4.8 3.8 4.0 3.8 3.9 3.9  CL 86* 89* 89* 92* 93* 94*  CO2 20* 25 24 26 27 28   GLUCOSE 83 80 104* 97 107* 110*  BUN 101* 51* 63* 48* 37* 37*  CREATININE 4.86* 3.22* 3.93* 3.00* 2.40* 2.41*  CALCIUM  11.2* 10.0 10.3 9.9 9.9 9.8  MG 2.5* 2.2 2.2 2.1 2.1  --   PHOS 5.2* 4.0 4.9* 3.3 2.8  --    GFR: Estimated Creatinine Clearance: 26.2 mL/min (A) (by C-G formula based on SCr of 2.41 mg/dL (H)).  Liver Function Tests: Recent Labs  Lab 07/09/24 0425 07/10/24 0412 07/15/24 0600  AST  --   --  21  ALT  --   --  20  ALKPHOS  --   --  82  BILITOT  --   --  0.8  PROT  --   --  6.5  ALBUMIN  2.8* 2.9* 2.9*   No results for input(s): LIPASE, AMYLASE in the last 168 hours. No results for input(s): AMMONIA in the last 168 hours.   CBC: Recent Labs  Lab 07/11/24 0425 07/12/24 0411  07/13/24 0437 07/14/24 0445 07/15/24 0600  WBC 13.9* 12.7* 12.0* 13.4* 12.1*  HGB 10.2* 9.9* 9.8* 9.8* 9.6*  HCT 30.4* 30.1* 30.7* 31.0* 29.9*  MCV 89.9 91.5 95.0 96.3 95.2  PLT 117* 134* 136* 160 142*     HbA1C: No results found for: HGBA1C  Urinalysis: No results for input(s): COLORURINE, LABSPEC, PHURINE, GLUCOSEU, HGBUR, BILIRUBINUR, KETONESUR, PROTEINUR, UROBILINOGEN, NITRITE, LEUKOCYTESUR in the last 72 hours.  Invalid input(s): APPERANCEUR    Imaging: No results found.      Medications:    anticoagulant sodium citrate      feeding supplement (OSMOLITE 1.5 CAL) 1,260 mL (07/14/24 1744)   sodium thiosulfate  25 g in sodium chloride  0.9 % 200 mL Infusion for Calciphylaxis     vancomycin       amiodarone   200 mg Per Tube TID   [START ON 07/18/2024] amiodarone   200 mg Oral Daily   apixaban   5 mg Oral BID   Chlorhexidine  Gluconate Cloth  6 each Topical Daily   collagenase   1 Application Topical Daily   free water   30 mL Per Tube Q4H   levothyroxine   75 mcg Per Tube Q0600  lidocaine   1 patch Transdermal Q24H   midodrine   15 mg Per Tube TID WC   multivitamin  1 tablet Per Tube QHS   nutrition supplement (JUVEN)  1 packet Per Tube BID BM   sodium chloride  flush  10-40 mL Intracatheter Q12H    Assessment/ Plan:     64 year old female with history of coronary disease history of CABG, ESRD, COPD, atrial fibrillation on hemodialysis Monday Wednesday and Friday now admitted with history of syncope/fall.  In hospital cardiac arrest on Sunday, 07/02/2024 Outpatient dialysis: [facility]/MWF/right PermCath/72.5 kg    ESRD: HD Monday Wednesday and Friday.  Dialysis received overnight with 500ml achieved. Next treatment scheduled for Monday. Will monitor discharge plan to determine outpatient needs.   ANEMIA of chronic kidney disease: Hemoglobin stable, 9.6.  Will consider restarting low dose retacrit  with dialysis   MBD (secondary  hyperparathyroidism): Maintain the patient on sodium thiosulfate  for calciphylaxis.   Hypotension/VOL/edema: Patient with A-fib with RVR.  Currently on heparin  drip and maintained on oral amiodarone .  Required pressors during this admission. Blood pressure remains soft, continue midodrine   Acute respiratory failure-requiring ventilator postcardiac arrest on 07/02/2024.  Status post extubation 07/11/2024. On 2L [place]      LOS: [address] [facility] 1/31/202611:47 AM  "

## 2024-07-15 NOTE — Progress Notes (Signed)
 " PROGRESS NOTE    [name]  FMW:[id number] DOB: [birth year] DOA: 07/01/2024 PCP: Orlean Alan HERO, FNP  248A/[id number]-AA  LOS: 13 days   Brief hospital course:   Assessment & Plan: [name] is a 64 y.o. female with a pmh of of ESRD on HD (MWF), COPD, CHF, bipolar disorder, chronic hypotension on midodrine , HLD and prior CVA who presented to [facility] ED via EMS following syncopal episode.   07/02/24-transferred to ICU post CODE BLUE on medical floor status post ACLS with ROSC  07/03/24- overnight, unresposive with AF. MWF(HD). Will meet with family. Will continue to monitor and re-evaluate post dialysis.  07/04/24- patient had few episodes of VT. She is DNR code status.  She was seen by cardiology, she has poor prognosis. No procedures are planned.  07/05/24- patient had hypotensive episode during HD so it was interuppted.  The lidocaine  infusion will be dcd today and amiodarone  will continue.  Plan for paracentesis today. 07/06/24- patient with no events overnight, did have a non sustained run of VT similar to previous.  Performing awakening trial today.  Due to staph/strep bacteremia the tunnelled vascCath has to come out.  07/07/24 - started PO amiodarone , MRI brain unremarkable 07/08/24 - more awake and interactive 07/09/24 - intubated and off sedation, failed WUA 07/10/24 - more awake, following commands, extubated 07/11/24 - HD catheter placed, triple lumen removed from left groin 07/12/24 - off vasopressin  07/13/24 transferred to [facility]   #Cardiac Arrest  #Ventricular Fibrillation --cardio consulted --cont amiodarone   #CAD  #Ischemic Cardiomyopathy  Hypotension --cont midodrine  --small bolus 250 ml for systolic <80's  #Septic Shock 2/2 Strep gordonii bacteremia- MRSA bacteremia --HD cath removed  Repeat blood cultrue X 2 NG 2 d echo no vegetations valves --cont IV vanc, will give for 4 weeks total 07/31/24 during dialysis  #Afib  --cont amiodarone  --cont  Eliquis   Acute metabolic encephalopathy  Likely hospital delirium --extubated with improvement in mental status, but not quite back to her baseline. MRI brain without acute pathology.   Chronic combined CHF --LVEF 25% and a dysfunctional and enlarged right ventricle.  --management per cardio   Acute respiratory failure  --in the setting of cardiac arrest, now extubated as of 07/10/24 and currently on nasal cannula.   Ascites  s/p paracentesis- possible cirrhosis could be cardiac cirrhosis Cell coulnt 605 ( 44% N) Protein not sent Culture neg   ESRD on HD  --had a tunneled dialysis catheter. With concern for bacteremia this was explanted. Placed temp HD catheter in the right groin. --iHD per nephro --PermCath next week  Hyponatremia --now stable in low 130's  Possible cervical myelopathy  --pt has torticollis.  Pt is not a good candidate for surgical fixation. --soft cervical collar    DVT prophylaxis: On:Eliquis  Code Status: DNR  Family Communication:  Level of care: Progressive Dispo:   The patient is from: home Anticipated d/c is to: [facility] Anticipated d/c date is: after PermCath placed   Subjective and Interval History:  Overnight, pt had chest pain.  Did not appear to be ACS.    Objective: Vitals:   07/15/24 0534 07/15/24 0754 07/15/24 1220 07/15/24 1641  BP: 103/64 (!) 94/49 (!) 98/55 (!) 108/55  Pulse: (!) 56 (!) 54 (!) 47 (!) 54  Resp: 20     Temp: 97.7 F (36.5 C) 98.1 F (36.7 C) 97.8 F (36.6 C) 98 F (36.7 C)  TempSrc:  Oral Oral Oral  SpO2: 100% 100% 100% 99%  Weight:  Height:        Intake/Output Summary (Last 24 hours) at 07/15/2024 1757 Last data filed at 07/15/2024 1428 Gross per 24 hour  Intake 270 ml  Output 0.5 ml  Net 269.5 ml   Filed Weights   07/13/24 1044 07/14/24 0500 07/15/24 0500  Weight: 79.9 kg 78.4 kg 80 kg    Examination:   Constitutional: NAD, head tilted to right right side HEENT: conjunctivae and lids  normal, EOMI CV: No cyanosis.   RESP: normal respiratory effort, on 3L Extremities: swelling in all extremities SKIN: warm, dry   Data Reviewed: I have personally reviewed labs and imaging studies  Time spent: 50 minutes  Ellouise Haber, MD [facility] If 7PM-7AM, please contact night-coverage 07/15/2024, 5:57 PM   "

## 2024-07-16 DIAGNOSIS — R55 Syncope and collapse: Secondary | ICD-10-CM | POA: Diagnosis not present

## 2024-07-16 LAB — BASIC METABOLIC PANEL WITH GFR
Anion gap: 13 (ref 5–15)
BUN: 67 mg/dL — ABNORMAL HIGH (ref 8–23)
CO2: 27 mmol/L (ref 22–32)
Calcium: 10.4 mg/dL — ABNORMAL HIGH (ref 8.9–10.3)
Chloride: 93 mmol/L — ABNORMAL LOW (ref 98–111)
Creatinine, Ser: 3.4 mg/dL — ABNORMAL HIGH (ref 0.44–1.00)
GFR, Estimated: 15 mL/min — ABNORMAL LOW
Glucose, Bld: 124 mg/dL — ABNORMAL HIGH (ref 70–99)
Potassium: 4.9 mmol/L (ref 3.5–5.1)
Sodium: 132 mmol/L — ABNORMAL LOW (ref 135–145)

## 2024-07-16 LAB — GLUCOSE, CAPILLARY
Glucose-Capillary: 113 mg/dL — ABNORMAL HIGH (ref 70–99)
Glucose-Capillary: 144 mg/dL — ABNORMAL HIGH (ref 70–99)
Glucose-Capillary: 113 mg/dL — ABNORMAL HIGH (ref 70–99)
Glucose-Capillary: 117 mg/dL — ABNORMAL HIGH (ref 70–99)

## 2024-07-16 LAB — MAGNESIUM: Magnesium: 2.2 mg/dL (ref 1.7–2.4)

## 2024-07-16 LAB — PHOSPHORUS: Phosphorus: 3.4 mg/dL (ref 2.5–4.6)

## 2024-07-16 NOTE — Progress Notes (Signed)
 " PROGRESS NOTE    [name]  FMW:[id number] DOB: March 15, 1961 DOA: 07/01/2024 PCP: Orlean Alan HERO, FNP  248A/[id number]-AA  LOS: 14 days   Brief hospital course:   Assessment & Plan: [name] is a 64 y.o. female with a pmh of of ESRD on HD (MWF), COPD, CHF, bipolar disorder, chronic hypotension on midodrine , HLD and prior CVA who presented to [facility] ED via EMS following syncopal episode.   07/02/24-transferred to ICU post CODE BLUE on medical floor status post ACLS with ROSC  07/03/24- overnight, unresposive with AF. MWF(HD). Will meet with family. Will continue to monitor and re-evaluate post dialysis.  07/04/24- patient had few episodes of VT. She is DNR code status.  She was seen by cardiology, she has poor prognosis. No procedures are planned.  07/05/24- patient had hypotensive episode during HD so it was interuppted.  The lidocaine  infusion will be dcd today and amiodarone  will continue.  Plan for paracentesis today. 07/06/24- patient with no events overnight, did have a non sustained run of VT similar to previous.  Performing awakening trial today.  Due to staph/strep bacteremia the tunnelled vascCath has to come out.  07/07/24 - started PO amiodarone , MRI brain unremarkable 07/08/24 - more awake and interactive 07/09/24 - intubated and off sedation, failed WUA 07/10/24 - more awake, following commands, extubated 07/11/24 - HD catheter placed, triple lumen removed from left groin 07/12/24 - off vasopressin  07/13/24 transferred to [facility]   #Cardiac Arrest  #Ventricular Fibrillation --cardio consulted --cont amio  #CAD  #Ischemic Cardiomyopathy  Hypotension --cont midodrine  --small bolus 250 ml for systolic <80's  #Septic Shock 2/2 Strep gordonii bacteremia- MRSA bacteremia --HD cath removed  Repeat blood cultrue X 2 NG 2 d echo no vegetations valves --cont IV Vanc, will give for 4 weeks total 07/31/24 during dialysis  #Afib  --cont amiodarone  --cont Eliquis   Acute  metabolic encephalopathy  Likely hospital delirium --extubated with improvement in mental status, but not quite back to her baseline. MRI brain without acute pathology.   Chronic combined CHF --LVEF 25% and a dysfunctional and enlarged right ventricle.  --management per cardio   Acute on chronic respiratory failure  On 2-3L [place] at baseline --in the setting of cardiac arrest, now extubated as of 07/10/24 and currently on nasal cannula.   Ascites  s/p paracentesis- possible cirrhosis could be cardiac cirrhosis Cell coulnt 605 ( 44% N) Protein not sent Culture neg   ESRD on HD  --had a tunneled dialysis catheter. With concern for bacteremia this was explanted. Placed temp HD catheter in the right groin. --iHD per nephro --PermCath next week  Hyponatremia --now stable in low 130's  Possible cervical myelopathy  --pt has torticollis.  Pt is not a good candidate for surgical fixation. --soft collar to help with pain    DVT prophylaxis: On:Eliquis  Code Status: DNR  Family Communication:  Level of care: Progressive Dispo:   The patient is from: home Anticipated d/c is to: [facility] Anticipated d/c date is: after PermCath placed   Subjective and Interval History:  Pt was more alert and interactive today.  Denied much pain in her neck, and able to turn her neck more towards midline.   Objective: Vitals:   07/16/24 0300 07/16/24 0751 07/16/24 1202 07/16/24 1500  BP:  102/61 (!) 104/53 (!) 111/57  Pulse:  60 (!) 55 (!) 56  Resp:   (!) 22 20  Temp:  97.9 F (36.6 C) 97.6 F (36.4 C) 99.3 F (37.4 C)  TempSrc:  Oral Oral Axillary  SpO2:  100% 100% 94%  Weight: 81 kg     Height:        Intake/Output Summary (Last 24 hours) at 07/16/2024 1818 Last data filed at 07/16/2024 1545 Gross per 24 hour  Intake 220 ml  Output --  Net 220 ml   Filed Weights   07/14/24 0500 07/15/24 0500 07/16/24 0300  Weight: 78.4 kg 80 kg 81 kg    Examination:   Constitutional: NAD, alert,  oriented to person and place HEENT: conjunctivae and lids normal, EOMI CV: No cyanosis.   RESP: normal respiratory effort, on 3L Extremities: edema in all extremities  SKIN: warm, dry   Data Reviewed: I have personally reviewed labs and imaging studies  Time spent: 35 minutes  Ellouise Haber, MD [facility] If 7PM-7AM, please contact night-coverage 07/16/2024, 6:18 PM   "

## 2024-07-17 DIAGNOSIS — R55 Syncope and collapse: Secondary | ICD-10-CM | POA: Diagnosis not present

## 2024-07-17 LAB — GLUCOSE, CAPILLARY
Glucose-Capillary: 123 mg/dL — ABNORMAL HIGH (ref 70–99)
Glucose-Capillary: 118 mg/dL — ABNORMAL HIGH (ref 70–99)
Glucose-Capillary: 129 mg/dL — ABNORMAL HIGH (ref 70–99)
Glucose-Capillary: 142 mg/dL — ABNORMAL HIGH (ref 70–99)
Glucose-Capillary: 110 mg/dL — ABNORMAL HIGH (ref 70–99)

## 2024-07-17 LAB — MIC RESULT

## 2024-07-17 LAB — MINIMUM INHIBITORY CONC. (1 DRUG)

## 2024-07-17 MED ORDER — SODIUM THIOSULFATE 250 MG/ML IV SOLN
25.0000 g | INTRAVENOUS | Status: AC
  Administered 2024-07-18 – 2024-07-19 (×2): 25 g via INTRAVENOUS
  Filled 2024-07-17 (×5): qty 100

## 2024-07-17 MED ORDER — LEVOTHYROXINE SODIUM 50 MCG PO TABS
75.0000 ug | ORAL_TABLET | Freq: Every day | ORAL | Status: DC
  Administered 2024-07-18 – 2024-07-20 (×3): 75 ug via ORAL
  Filled 2024-07-17 (×3): qty 1

## 2024-07-17 MED ORDER — JUVEN PO PACK
1.0000 | PACK | Freq: Two times a day (BID) | ORAL | Status: DC
  Administered 2024-07-17 – 2024-07-20 (×5): 1 via ORAL

## 2024-07-17 MED ORDER — RENA-VITE PO TABS
1.0000 | ORAL_TABLET | Freq: Every day | ORAL | Status: DC
  Administered 2024-07-17 – 2024-07-20 (×4): 1 via ORAL
  Filled 2024-07-17 (×4): qty 1

## 2024-07-17 MED ORDER — MIDODRINE HCL 5 MG PO TABS: 10.0000 mg | ORAL_TABLET | Freq: Three times a day (TID) | ORAL | Status: DC

## 2024-07-17 MED ORDER — VANCOMYCIN HCL 750 MG/150ML IV SOLN
750.0000 mg | INTRAVENOUS | Status: AC
  Administered 2024-07-19: 750 mg via INTRAVENOUS
  Filled 2024-07-17 (×4): qty 150

## 2024-07-17 MED ORDER — MIDODRINE HCL 5 MG PO TABS
10.0000 mg | ORAL_TABLET | Freq: Three times a day (TID) | ORAL | Status: DC
  Administered 2024-07-17 – 2024-07-20 (×11): 10 mg via ORAL
  Filled 2024-07-17 (×11): qty 2

## 2024-07-17 NOTE — Progress Notes (Signed)
 I have reviewed her case with the medical team.  She is not a candidate for the extent of surgery required to address any possible cervical discitis.  ID will extend antibiotics to cover osteomyelitis.    Please reengage me if there any changes and require reassessment.  [name]

## 2024-07-17 NOTE — TOC Progression Note (Signed)
 Transition of Care [facility]) - Progression Note    Patient Details  Name: DIARRA CEJA MRN: [id number] Date of Birth: 1960-06-27  Transition of Care [facility] CM/SW Contact  Lauraine JAYSON Carpen, LCSW Phone Number: 07/17/2024, 9:47 AM  Clinical Narrative:  Patient still has NG. Will continue to follow progress.   Expected Discharge Plan: Home w Home Health Services Barriers to Discharge: Continued Medical Work up               Expected Discharge Plan and Services       Living arrangements for the past 2 months: Single Family Home                           HH Arranged: RN, PT, OT [facility] Agency: [facility] Date [facility] Agency Contacted: 07/06/24   Representative spoke with at [facility] Agency: [place]   Social Drivers of Health (SDOH) Interventions SDOH Screenings   Food Insecurity: No Food Insecurity (07/02/2024)  Housing: Low Risk (07/02/2024)  Transportation Needs: No Transportation Needs (07/02/2024)  Utilities: Not At Risk (07/02/2024)  Depression (PHQ2-9): Low Risk (07/06/2023)  Financial Resource Strain: Low Risk  (05/31/2024)   Received from [facility]  Tobacco Use: High Risk (07/01/2024)    Readmission Risk Interventions    07/06/2024   11:55 AM 06/26/2024   12:20 PM 12/23/2021   11:41 AM  Readmission Risk Prevention Plan  Transportation Screening Complete Complete Complete  PCP or Specialist Appt within 3-5 Days  Complete   HRI or Home Care Consult  Complete Complete  Social Work Consult for Recovery Care Planning/Counseling  Complete Complete  Palliative Care Screening  Not Applicable Not Applicable  Medication Review [employer]) Complete Complete Complete  PCP or Specialist appointment within 3-5 days of discharge Complete    HRI or Home Care Consult Complete    SW Recovery Care/Counseling Consult Complete    Palliative Care Screening Not Applicable    Skilled Nursing Facility Not Applicable

## 2024-07-17 NOTE — Plan of Care (Signed)
  Problem: Education: Goal: Knowledge of General Education information will improve Description: Including pain rating scale, medication(s)/side effects and non-pharmacologic comfort measures Outcome: Progressing   Problem: Activity: Goal: Risk for activity intolerance will decrease Outcome: Progressing   Problem: Nutrition: Goal: Adequate nutrition will be maintained Outcome: Progressing   Problem: Elimination: Goal: Will not experience complications related to bowel motility Outcome: Progressing Goal: Will not experience complications related to urinary retention Outcome: Progressing   Problem: Pain Managment: Goal: General experience of comfort will improve and/or be controlled Outcome: Progressing   Problem: Safety: Goal: Ability to remain free from injury will improve Outcome: Progressing   Problem: Skin Integrity: Goal: Risk for impaired skin integrity will decrease Outcome: Progressing

## 2024-07-17 NOTE — Progress Notes (Signed)
 Patient ID: [name], female   DOB: [birth year], 64 y.o.   MRN: [id number] [facility]    SUBJECTIVE: Patient states to feel much improved neck pain is improved not wearing her neck soft collar no pain stable shortness of breath no fever chills or sweats resting comfortably in bed   Vitals:   07/17/24 0107 07/17/24 0500 07/17/24 0518 07/17/24 0920  BP: (!) 118/57  (!) 113/56 123/63  Pulse: 69  67 63  Resp: 20  19 19   Temp: 98 F (36.7 C)  98.4 F (36.9 C) 99.7 F (37.6 C)  TempSrc:    Axillary  SpO2: 93%  90% 99%  Weight:  80.6 kg    Height:         Intake/Output Summary (Last 24 hours) at 07/17/2024 1036 Last data filed at 07/17/2024 0700 Gross per 24 hour  Intake 1270 ml  Output --  Net 1270 ml      PHYSICAL EXAM  General: Well developed, well nourished, in no acute distress HEENT:  Normocephalic and atramatic Neck:  No JVD.  Lungs: Clear bilaterally to auscultation and percussion. Heart: HRRR . Normal S1 and S2 without gallops or murmurs.  Abdomen: Bowel sounds are positive, abdomen soft and non-tender  Msk:  Back normal, normal gait. Normal strength and tone for age. Extremities: No clubbing, cyanosis or edema.   Neuro: Alert and oriented X 3. Psych:  Good affect, responds appropriately   LABS: Basic Metabolic Panel: Recent Labs    07/15/24 0526 07/15/24 0600 07/16/24 0930  NA 134* 134* 132*  K 3.9 3.9 4.9  CL 93* 94* 93*  CO2 27 28 27   GLUCOSE 107* 110* 124*  BUN 37* 37* 67*  CREATININE 2.40* 2.41* 3.40*  CALCIUM  9.9 9.8 10.4*  MG 2.1  --  2.2  PHOS 2.8  --  3.4   Liver Function Tests: Recent Labs    07/15/24 0600  AST 21  ALT 20  ALKPHOS 82  BILITOT 0.8  PROT 6.5  ALBUMIN  2.9*   No results for input(s): LIPASE, AMYLASE in the last 72 hours. CBC: Recent Labs    07/15/24 0600  WBC 12.1*  HGB 9.6*  HCT 29.9*  MCV 95.2  PLT 142*   Cardiac Enzymes: No results for input(s): CKTOTAL, CKMB, CKMBINDEX, TROPONINI in  the last 72 hours. BNP: Invalid input(s): POCBNP D-Dimer: No results for input(s): DDIMER in the last 72 hours. Hemoglobin A1C: No results for input(s): HGBA1C in the last 72 hours. Fasting Lipid Panel: No results for input(s): CHOL, HDL, LDLCALC, TRIG, CHOLHDL, LDLDIRECT in the last 72 hours. Thyroid  Function Tests: No results for input(s): TSH, T4TOTAL, T3FREE, THYROIDAB in the last 72 hours.  Invalid input(s): FREET3 Anemia Panel: No results for input(s): VITAMINB12, FOLATE, FERRITIN, TIBC, IRON, RETICCTPCT in the last 72 hours.  No results found.   Echo severely postoperative function EF less than 25%  TELEMETRY: Normal sinus rhythm rate of 65 nonspecific ST-T wave changes:  ASSESSMENT AND PLAN:  Principal Problem:   Syncope and collapse Active Problems:   ESRD on hemodialysis (HCC)   Chronic hypotension   S/P CABG x 3   Mild dementia associated with other underlying disease, without behavioral disturbance, psychotic disturbance, mood disturbance, or anxiety (HCC)   Anemia of chronic disease   ESRD (end stage renal disease) (HCC)   Paroxysmal atrial fibrillation (HCC)   Hypothyroidism   Chronic combined systolic and diastolic CHF (congestive heart failure) (HCC)   Hepatitis C antibody positive  in blood   Other cirrhosis of liver (HCC)   Hypertension   Bipolar disorder in full remission, most recent episode unspecified type   Chronic pain syndrome   Other secondary kyphosis, cervical region   Cervical spondylosis   Fall   Acute pulmonary edema (HCC)   Bacteremia due to Streptococcus   MRSA bacteremia   ESRD (end stage renal disease) on dialysis (HCC)   Acute on chronic respiratory failure with hypoxia (HCC)   Acute on chronic HFrEF (heart failure with reduced ejection fraction) (HCC)   Cardiac arrest with ventricular fibrillation (HCC)   Toxic metabolic encephalopathy   Pressure injury of skin   Discitis of cervical  region    Plan Continue dialysis therapy for end-stage renal disease as per nephrology Ischemic cardiomyopathy chronic continue GDMT as tolerated Atrial fibrillation on amiodarone  and Eliquis  Chronic systolic HFrEF EF less than 25% continue current therapy Acute on chronic respiratory failure hypoxemia on supplemental oxygen  improved continue supportive care inhalers as necessary History of septic shock strep bacteremia thought probably related to hemodialysis catheter which was removed but blood cultures negative x 2 with no vegetations on echocardiogram continue conservative therapy recommended Vanco for 4 weeks during dialysis Hypotension chronic midodrine  maintain blood pressure hopefully systolic of 90 Continue conservative cardiac input at this stage Consider palliative care for goals of care   [name], MD 07/17/2024 10:36 AM

## 2024-07-18 LAB — CBC WITH DIFFERENTIAL/PLATELET
Abs Immature Granulocytes: 0.21 10*3/uL — ABNORMAL HIGH (ref 0.00–0.07)
Basophils Absolute: 0 10*3/uL (ref 0.0–0.1)
Basophils Relative: 0 %
Eosinophils Absolute: 0.2 10*3/uL (ref 0.0–0.5)
Eosinophils Relative: 1 %
HCT: 29 % — ABNORMAL LOW (ref 36.0–46.0)
Hemoglobin: 9.2 g/dL — ABNORMAL LOW (ref 12.0–15.0)
Immature Granulocytes: 1 %
Lymphocytes Relative: 9 %
Lymphs Abs: 1.3 10*3/uL (ref 0.7–4.0)
MCH: 30.9 pg (ref 26.0–34.0)
MCHC: 31.7 g/dL (ref 30.0–36.0)
MCV: 97.3 fL (ref 80.0–100.0)
Monocytes Absolute: 1.1 10*3/uL — ABNORMAL HIGH (ref 0.1–1.0)
Monocytes Relative: 8 %
Neutro Abs: 11.9 10*3/uL — ABNORMAL HIGH (ref 1.7–7.7)
Neutrophils Relative %: 81 %
Platelets: 137 10*3/uL — ABNORMAL LOW (ref 150–400)
RBC: 2.98 MIL/uL — ABNORMAL LOW (ref 3.87–5.11)
RDW: 21 % — ABNORMAL HIGH (ref 11.5–15.5)
WBC: 14.7 10*3/uL — ABNORMAL HIGH (ref 4.0–10.5)
nRBC: 0 % (ref 0.0–0.2)

## 2024-07-18 LAB — RENAL FUNCTION PANEL
Albumin: 3.1 g/dL — ABNORMAL LOW (ref 3.5–5.0)
Anion gap: 15 (ref 5–15)
BUN: 109 mg/dL — ABNORMAL HIGH (ref 8–23)
CO2: 27 mmol/L (ref 22–32)
Calcium: 11.5 mg/dL — ABNORMAL HIGH (ref 8.9–10.3)
Chloride: 92 mmol/L — ABNORMAL LOW (ref 98–111)
Creatinine, Ser: 4.88 mg/dL — ABNORMAL HIGH (ref 0.44–1.00)
GFR, Estimated: 10 mL/min — ABNORMAL LOW
Glucose, Bld: 109 mg/dL — ABNORMAL HIGH (ref 70–99)
Phosphorus: 4.2 mg/dL (ref 2.5–4.6)
Potassium: 7.5 mmol/L (ref 3.5–5.1)
Sodium: 133 mmol/L — ABNORMAL LOW (ref 135–145)

## 2024-07-18 LAB — GLUCOSE, CAPILLARY
Glucose-Capillary: 100 mg/dL — ABNORMAL HIGH (ref 70–99)
Glucose-Capillary: 103 mg/dL — ABNORMAL HIGH (ref 70–99)
Glucose-Capillary: 82 mg/dL (ref 70–99)
Glucose-Capillary: 90 mg/dL (ref 70–99)

## 2024-07-18 MED ORDER — HEPARIN SODIUM (PORCINE) 1000 UNIT/ML IJ SOLN
INTRAMUSCULAR | Status: AC
  Filled 2024-07-18: qty 3

## 2024-07-18 MED ORDER — EPOETIN ALFA-EPBX 4000 UNIT/ML IJ SOLN
INTRAMUSCULAR | Status: AC
  Filled 2024-07-18: qty 1

## 2024-07-18 MED ORDER — EPOETIN ALFA-EPBX 4000 UNIT/ML IJ SOLN
4000.0000 [IU] | Freq: Once | INTRAMUSCULAR | Status: AC
  Administered 2024-07-18: 4000 [IU] via INTRAVENOUS

## 2024-07-18 MED ORDER — VANCOMYCIN HCL 750 MG/150ML IV SOLN
750.0000 mg | Freq: Once | INTRAVENOUS | Status: AC
  Administered 2024-07-18: 750 mg via INTRAVENOUS
  Filled 2024-07-18 (×2): qty 150

## 2024-07-18 MED ORDER — NEPRO/CARBSTEADY PO LIQD
1000.0000 mL | ORAL | Status: DC
  Administered 2024-07-18: 1000 mL via ORAL

## 2024-07-18 NOTE — Progress Notes (Signed)
 SLP Cancellation Note  Patient Details Name: [name] MRN: [id number] DOB: 30-Oct-1960   Cancelled treatment:       Reason Eval/Treat Not Completed: Patient at procedure or test/unavailable  Pt at HD.   Julieanna Geraci 07/18/2024, 11:09 AM

## 2024-07-18 NOTE — Progress Notes (Addendum)
 "  Date of Admission:  07/01/2024    ID: [name] is a 64 y.o. female  Principal Problem:   Syncope and collapse Active Problems:   ESRD on hemodialysis (HCC)   Chronic hypotension   S/P CABG x 3   Mild dementia associated with other underlying disease, without behavioral disturbance, psychotic disturbance, mood disturbance, or anxiety (HCC)   Anemia of chronic disease   ESRD (end stage renal disease) (HCC)   Paroxysmal atrial fibrillation (HCC)   Hypothyroidism   Chronic combined systolic and diastolic CHF (congestive heart failure) (HCC)   Hepatitis C antibody positive in blood   Other cirrhosis of liver (HCC)   Hypertension   Bipolar disorder in full remission, most recent episode unspecified type   Chronic pain syndrome   Other secondary kyphosis, cervical region   Cervical spondylosis   Fall   Acute pulmonary edema (HCC)   Bacteremia due to Streptococcus   MRSA bacteremia   ESRD (end stage renal disease) on dialysis (HCC)   Acute on chronic respiratory failure with hypoxia (HCC)   Acute on chronic HFrEF (heart failure with reduced ejection fraction) (HCC)   Cardiac arrest with ventricular fibrillation (HCC)   Toxic metabolic encephalopathy   Pressure injury of skin   Discitis of cervical region    Subjective: More awake , talking  Medications:   amiodarone   200 mg Oral Daily   apixaban   5 mg Oral BID   Chlorhexidine  Gluconate Cloth  6 each Topical Daily   collagenase   1 Application Topical Daily   free water   30 mL Per Tube Q4H   levothyroxine   75 mcg Oral Q0600   lidocaine   1 patch Transdermal Q24H   midodrine   10 mg Oral TID WC   multivitamin  1 tablet Oral QHS   nutrition supplement (JUVEN)  1 packet Oral BID BM   sodium chloride  flush  10-40 mL Intracatheter Q12H    Objective: Vital signs in last 24 hours: Patient Vitals for the past 24 hrs:  BP Temp Temp src Pulse Resp SpO2 Weight  07/17/24 2026 (!) 105/56 97.8 F (36.6 C) -- 60 20 100 % --   07/17/24 1741 108/65 99.5 F (37.5 C) Oral 61 18 98 % --  07/17/24 1250 119/71 97.6 F (36.4 C) Oral 62 18 98 % --  07/17/24 0920 123/63 99.7 F (37.6 C) Axillary 63 19 99 % --  07/17/24 0518 (!) 113/56 98.4 F (36.9 C) -- 67 19 90 % --  07/17/24 0500 -- -- -- -- -- -- 80.6 kg       PHYSICAL EXAM:  General: more awake, alert , responds to questions Neck tilted to right but cn straighten it Lungs: b/la ir enry Heart: s1s2 Abdomen: Soft, non-tender,not distended. Bowel sounds normal. No masses Extremities: left leg- ulcer over the achilles tendon Skin: No rashes or lesions. Or bruising Lymph: Cervical, supraclavicular normal. Neurologic: non focal  Lab Results    Latest Ref Rng & Units 07/15/2024    6:00 AM 07/14/2024    4:45 AM 07/13/2024    4:37 AM  CBC  WBC 4.0 - 10.5 K/uL 12.1  13.4  12.0   Hemoglobin 12.0 - 15.0 g/dL 9.6  9.8  9.8   Hematocrit 36.0 - 46.0 % 29.9  31.0  30.7   Platelets 150 - 400 K/uL 142  160  136        Latest Ref Rng & Units 07/16/2024    9:30 AM 07/15/2024  6:00 AM 07/15/2024    5:26 AM  CMP  Glucose 70 - 99 mg/dL 875  889  892   BUN 8 - 23 mg/dL 67  37  37   Creatinine 0.44 - 1.00 mg/dL 6.59  7.58  7.59   Sodium 135 - 145 mmol/L 132  134  134   Potassium 3.5 - 5.1 mmol/L 4.9  3.9  3.9   Chloride 98 - 111 mmol/L 93  94  93   CO2 22 - 32 mmol/L 27  28  27    Calcium  8.9 - 10.3 mg/dL 89.5  9.8  9.9   Total Protein 6.5 - 8.1 g/dL  6.5    Total Bilirubin 0.0 - 1.2 mg/dL  0.8    Alkaline Phos 38 - 126 U/L  82    AST 15 - 41 U/L  21    ALT 0 - 44 U/L  20        Microbiology: 07/02/24 [facility] MRSA anerobic bottle of 1 set Strep gordonii in the 2nd set Staph epidermidis second ser  07/04/24 [facility] NG 07/05/24 ascites NG 07/10/24 BC NG   Assessment/Plan: 64 y.o. female with a history of ESRD, CAD s/p CABG, Afib ICM, h/o pericardial effusion h/o PEA arrest , steal syndrome rt arm with ligation of rt brachial artery to axillary vein graft on 04/20/24  recently in the hospital between 06/22/24 until 06/28/24 for syncope, fluid over load, chronic hypotension, cardiogenic shock EF 25-30%,  presenting with fall/ weakness and found on the floor   Syncope Related to her cardiac state    Severe ischemic cardiomyopathy with biventricular failure    ESRD on dialysis thru a catheter   Ventricular fibrillation and cardiac arrest on 07/02/24- resucitated -     Strep gordonii bacteremia- MRSA bacteremia   blood culture done after resuscitation though she had fever on the day of admission  but no blood  culture sent on admission Likely related to dialysis cath HD cath removed  Repeat blood cultrue X 2 NG 2 d echo no vegetations valves  On Vancomycin   Will give for 6 weeks total 08/16/24 during dialysis    C5-C6 vertebral  endplates collapse- degeneration VS burnt out infection VS smoldering infection Will treat with vancomycin  for 6 weeks during dialysis as she has MRSA /strep gordiniii in blood culture  until 08/16/24  There is spinal instability - seen by neurosurgery , not a candidate for surgery due to her comorbidities     Ascites s/p paracentesis- possible cirrhosis could be cardiac cirrhosis Cell coulnt 605 ( 44% N) Protein not sent Culture neg     Chronic thrombocytopenia  likely due to cirrhosis Anemia      HEPC antibody positive but RNA negative -= so no active infection   Stasis dermatitis with  ulcers lower extremity- 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 Discussed the management with hospitalist  New OPAT note writtent     "

## 2024-07-18 NOTE — Progress Notes (Signed)
 Faxed updated pharmacy note to pat out-pt clinic [facility]. Navigator following to assist with any HD needs.  Suzen Satchel Dialysis Navigator [phone]

## 2024-07-18 NOTE — Progress Notes (Addendum)
 PHARMACY CONSULT NOTE FOR:  OUTPATIENT  PARENTERAL ANTIBIOTIC THERAPY (OPAT)  Indication: MRSA Bacteremia, cervical discitis Regimen: Vancomycin  750mg  IV qHD on Mon/Wed/Fri End date: 08/16/2024  Labs - weekly:  CBC/D, CMP, and pre-HD vancomycin  level Fax weekly lab results  promptly to [phone] Call [phone] with any questions  IV antibiotic discharge orders are pended. To discharging provider:  please sign these orders via discharge navigator,  Select New Orders & click on the button choice - Manage This Unsigned Work.     Thank you for allowing pharmacy to be a part of this patient's care.  Lorenz Donley, PharmD, BCPS, BCIDP Work Cell: [phone] 07/18/2024 1:20 PM

## 2024-07-18 NOTE — Plan of Care (Signed)
 VSS. 4L [place]. Patient taken to HD this shift. See notes. Family at bedside this shift to try to encourage oral intake. Nocturnal feeds started. Dressing changed per order.  Problem: Education: Goal: Knowledge of General Education information will improve Description: Including pain rating scale, medication(s)/side effects and non-pharmacologic comfort measures Outcome: Progressing   Problem: Health Behavior/Discharge Planning: Goal: Ability to manage health-related needs will improve Outcome: Progressing   Problem: Clinical Measurements: Goal: Ability to maintain clinical measurements within normal limits will improve Outcome: Progressing Goal: Will remain free from infection Outcome: Progressing Goal: Diagnostic test results will improve Outcome: Progressing Goal: Respiratory complications will improve Outcome: Progressing Goal: Cardiovascular complication will be avoided Outcome: Progressing   Problem: Activity: Goal: Risk for activity intolerance will decrease Outcome: Progressing   Problem: Nutrition: Goal: Adequate nutrition will be maintained Outcome: Progressing   Problem: Coping: Goal: Level of anxiety will decrease Outcome: Progressing   Problem: Elimination: Goal: Will not experience complications related to bowel motility Outcome: Progressing Goal: Will not experience complications related to urinary retention Outcome: Progressing   Problem: Pain Managment: Goal: General experience of comfort will improve and/or be controlled Outcome: Progressing   Problem: Safety: Goal: Ability to remain free from injury will improve Outcome: Progressing   Problem: Skin Integrity: Goal: Risk for impaired skin integrity will decrease Outcome: Progressing

## 2024-07-18 NOTE — Plan of Care (Signed)
  Problem: Clinical Measurements: Goal: Ability to maintain clinical measurements within normal limits will improve Outcome: Progressing   Problem: Activity: Goal: Risk for activity intolerance will decrease Outcome: Progressing   Problem: Pain Managment: Goal: General experience of comfort will improve and/or be controlled Outcome: Progressing   Problem: Safety: Goal: Ability to remain free from injury will improve Outcome: Progressing   Problem: Skin Integrity: Goal: Risk for impaired skin integrity will decrease Outcome: Progressing

## 2024-07-18 NOTE — Progress Notes (Addendum)
 Nutrition Follow-up  DOCUMENTATION CODES:   Not applicable  INTERVENTION:   -Continue dysphagia 2 diet with thin liquids; RD will follow for diet advancement and adjust supplement regimen as appropriate -Continue Magic cup TID with meals, each supplement provides 290 kcal and 9 grams of protein  -Continue feeding assistance with meals -Continue nocturnal feeds via NGT:    Nepro @ 80 ml/hr x 12 hours (2000-0800)    60 ml Proosurce TF20 daily   30 ml free water  flush every 4 hours   Tube feeding regimen provides 1808 kcal (100% of needs), 198 grams of protein, and 698 ml of H2O. Total free water : 878 ml daily   -Continue 1 packet Juven BID, each packet provides 95 calories, 2.5 grams of protein (collagen), and 9.8 grams of carbohydrate (3 grams sugar); also contains 7 grams of L-arginine and L-glutamine, 300 mg vitamin C , 15 mg vitamin E, 1.2 mcg vitamin B-12, 9.5 mg zinc, 200 mg calcium , and 1.5 g  Calcium  Beta-hydroxy-Beta-methylbutyrate to support wound healing  -Care plan changes communicated with RN, nephrology, and MD  NUTRITION DIAGNOSIS:   Inadequate oral intake related to inability to eat (pt sedated and ventilated) as evidenced by NPO status.  Ongoing  GOAL:   Patient will meet greater than or equal to 90% of their needs  Progressing   MONITOR:   Diet advancement, Labs, Weight trends, TF tolerance, Skin, I & O's  REASON FOR ASSESSMENT:   Ventilator    ASSESSMENT:   64 y/o female with h/o ESRD on HD, CAD s/p CABG, ischemic cardiomyopathy, dementia, CVA, PVD, PAF, COPD, HLD, hypothyroid disease, CHF, HCV, cirrhosis, HTN,  DDD, chronic pain, GERD, bipolar disorder, gout, diverticulosis and pulmonary hypertension who is admitted with cardiac arrest, bacteremia and septic shock.  1/26- extubated, NGT placed 1/28- TF initiated at trickle rate 1/30- s/p BSE- dysphagia 2 diet with thin liquids, calorie count initiated, transitioned to nocturnal feeds 2/2- calorie  count completed (consuming 7% of needs), continue nocturnal feeds per MD  Reviewed I/O's: +190 ml x 24 hours and +10.6 L since 07/04/24  Case discussed with MD and nephrology. Noted Patient's K is elevated (7.5). Nephrology has ordered a 1K bath for HD today. Next treatment scheduled for tomorrow (07/19/24).   Patient remains on a dysphagia 3 diet with minimal intake. Noted meal completions 10% on 07/16/24 and 0% for dinner on 07/17/24. RD will switch to renal friend formula due to elevated K.   Palliative care following for goals of care discussions. Patient is DNR/DNI and to continue supportive interventions. Family wants to allow time for outcomes.   Medications reviewed and include midodrine   Labs reviewed: Na: 133, K: 7.5, Phos WDL, calcium : 11.5, CBGS: 100-142 (inpatient orders for glycemic control are none).    Diet Order:   Diet Order             DIET DYS 2 Room service appropriate? Yes with Assist; Fluid consistency: Thin  Diet effective now                   EDUCATION NEEDS:   No education needs have been identified at this time  Skin:  Skin Assessment: Reviewed RN Assessment (ulcers L foot and heel, Stage II sacrum)  Last BM:  07/18/24  Height:   Ht Readings from Last 1 Encounters:  07/01/24 5' 6 (1.676 m)    Weight:   Wt Readings from Last 1 Encounters:  07/18/24 80.3 kg    Ideal Body Weight:  59 kg  BMI:  Body mass index is 28.57 kg/m.  Estimated Nutritional Needs:   Kcal:  1800-2100kcal/day  Protein:  90-105g/day  Fluid:  UOP +1L    Margery ORN, RD, LDN, CDCES Registered Dietitian III Certified Diabetes Care and Education Specialist If unable to reach this RD, please use RD Inpatient group chat on secure chat between hours of 8am-4 pm daily

## 2024-07-18 NOTE — Progress Notes (Signed)
 Patient ID: JAYNIA FENDLEY, female   DOB: 06-24-1960, 64 y.o.   MRN: [id number] [facility]    SUBJECTIVE: Resting comfortably in bed denies any neck pain no shortness of breath no palpitation tachycardia still has some lower leg discomfort pain with weakness neck pain is improved   Vitals:   07/17/24 0920 07/17/24 1250 07/17/24 1741 07/17/24 2026  BP: 123/63 119/71 108/65 (!) 105/56  Pulse: 63 62 61 60  Resp: 19 18 18 20   Temp: 99.7 F (37.6 C) 97.6 F (36.4 C) 99.5 F (37.5 C) 97.8 F (36.6 C)  TempSrc: Axillary Oral Oral   SpO2: 99% 98% 98% 100%  Weight:      Height:         Intake/Output Summary (Last 24 hours) at 07/18/2024 9362 Last data filed at 07/17/2024 2203 Gross per 24 hour  Intake 460 ml  Output --  Net 460 ml      PHYSICAL EXAM  General: Well developed, well nourished, in no acute distress HEENT:  Normocephalic and atramatic Neck:  No JVD.  Lungs: Clear bilaterally to auscultation and percussion. Heart: HRRR . Normal S1 and S2 without gallops or murmurs.  Abdomen: Bowel sounds are positive, abdomen soft and non-tender  Msk:  Back normal, normal gait. Normal strength and tone for age. Extremities: No clubbing, cyanosis or edema.  Reduced pulses generalized weakness Neuro: Alert and oriented X 3. Psych:  Good affect, responds appropriately   LABS: Basic Metabolic Panel: Recent Labs    07/16/24 0930  NA 132*  K 4.9  CL 93*  CO2 27  GLUCOSE 124*  BUN 67*  CREATININE 3.40*  CALCIUM  10.4*  MG 2.2  PHOS 3.4   Liver Function Tests: No results for input(s): AST, ALT, ALKPHOS, BILITOT, PROT, ALBUMIN  in the last 72 hours. No results for input(s): LIPASE, AMYLASE in the last 72 hours. CBC: No results for input(s): WBC, NEUTROABS, HGB, HCT, MCV, PLT in the last 72 hours. Cardiac Enzymes: No results for input(s): CKTOTAL, CKMB, CKMBINDEX, TROPONINI in the last 72 hours. BNP: Invalid input(s):  POCBNP D-Dimer: No results for input(s): DDIMER in the last 72 hours. Hemoglobin A1C: No results for input(s): HGBA1C in the last 72 hours. Fasting Lipid Panel: No results for input(s): CHOL, HDL, LDLCALC, TRIG, CHOLHDL, LDLDIRECT in the last 72 hours. Thyroid  Function Tests: No results for input(s): TSH, T4TOTAL, T3FREE, THYROIDAB in the last 72 hours.  Invalid input(s): FREET3 Anemia Panel: No results for input(s): VITAMINB12, FOLATE, FERRITIN, TIBC, IRON, RETICCTPCT in the last 72 hours.  No results found.   Echo failure depressed left trickle function EF of less than 25%  TELEMETRY: Sinus bradycardia with PVCs rate of 55:  ASSESSMENT AND PLAN:  Principal Problem:   Syncope and collapse Active Problems:   ESRD on hemodialysis (HCC)   Chronic hypotension   S/P CABG x 3   Mild dementia associated with other underlying disease, without behavioral disturbance, psychotic disturbance, mood disturbance, or anxiety (HCC)   Anemia of chronic disease   ESRD (end stage renal disease) (HCC)   Paroxysmal atrial fibrillation (HCC)   Hypothyroidism   Chronic combined systolic and diastolic CHF (congestive heart failure) (HCC)   Hepatitis C antibody positive in blood   Other cirrhosis of liver (HCC)   Hypertension   Bipolar disorder in full remission, most recent episode unspecified type   Chronic pain syndrome   Other secondary kyphosis, cervical region   Cervical spondylosis   Fall   Acute pulmonary edema (  HCC)   Bacteremia due to Streptococcus   MRSA bacteremia   ESRD (end stage renal disease) on dialysis (HCC)   Acute on chronic respiratory failure with hypoxia (HCC)   Acute on chronic HFrEF (heart failure with reduced ejection fraction) (HCC)   Cardiac arrest with ventricular fibrillation (HCC)   Toxic metabolic encephalopathy   Pressure injury of skin   Discitis of cervical region    Plan Acute on chronic ischemic cardiomyopathy  continue current therapy LV function between 20 and 25% continue current therapy End-stage renal disease on dialysis continue management as per nephrology Paroxysmal atrial fibrillation currently in sinus rhythm rate of 55 and on amiodarone  Eliquis  Metabolic encephalopathy appears to improved continue current management Multivessel coronary disease including coronary bypass surgery stable no recent angina continue conservative management Severe peripheral vascular disease with no significant interventional options recommend conservative medical therapy pain control No invasive cardiac procedures recommended Recommend palliative care for goals of care discussion   [name], MD 07/18/2024 6:37 AM

## 2024-07-18 NOTE — Consult Note (Addendum)
 WOC Nurse Consult Note: Reason for Consult: NG tube mucosal pressure injury L nare  Wound type: Medical Device Related Pressure Injury L naris  Pressure Injury POA: no  Measurement: see nursing flowsheet  Wound bed: pink moist  Drainage (amount, consistency, odor)  Periwound:  Dressing procedure/placement/frequency: Cleanse L naris wound with NS every shift and reposition NG tube to remove pressure from this area as much as possible.    Patient is currently receiving nocturnal tube feeds through NG tube.    POC discussed with bedside nurse.  WOC team will follow every 7 to 10 days to assess area and change POC as needed.  Reconsult for acute changes to wound bed.   Thank you,    Powell Bar MSN, RN-BC, [employer]

## 2024-07-18 NOTE — Progress Notes (Signed)
 OT Cancellation Note  Patient Details Name: DONNALYNN WHEELESS MRN: [id number] DOB: June 16, 1960   Cancelled Treatment:    Reason Eval/Treat Not Completed: Patient at procedure or test/ unavailable. Pt currently at dialysis. OT will re-attempt when pt is next available.   Izetta Claude, MS, OTR/L , CBIS ascom [phone]  07/18/24, 9:56 AM

## 2024-07-19 LAB — GLUCOSE, CAPILLARY
Glucose-Capillary: 82 mg/dL (ref 70–99)
Glucose-Capillary: 91 mg/dL (ref 70–99)
Glucose-Capillary: 88 mg/dL (ref 70–99)
Glucose-Capillary: 79 mg/dL (ref 70–99)
Glucose-Capillary: 84 mg/dL (ref 70–99)

## 2024-07-19 LAB — CBC
HCT: 31.6 % — ABNORMAL LOW (ref 36.0–46.0)
Hemoglobin: 9.8 g/dL — ABNORMAL LOW (ref 12.0–15.0)
MCH: 30.4 pg (ref 26.0–34.0)
MCHC: 31 g/dL (ref 30.0–36.0)
MCV: 98.1 fL (ref 80.0–100.0)
Platelets: 146 10*3/uL — ABNORMAL LOW (ref 150–400)
RBC: 3.22 MIL/uL — ABNORMAL LOW (ref 3.87–5.11)
RDW: 21.3 % — ABNORMAL HIGH (ref 11.5–15.5)
WBC: 9.6 10*3/uL (ref 4.0–10.5)
nRBC: 0 % (ref 0.0–0.2)

## 2024-07-19 LAB — BASIC METABOLIC PANEL WITH GFR
Anion gap: 16 — ABNORMAL HIGH (ref 5–15)
BUN: 64 mg/dL — ABNORMAL HIGH (ref 8–23)
CO2: 25 mmol/L (ref 22–32)
Calcium: 10.4 mg/dL — ABNORMAL HIGH (ref 8.9–10.3)
Chloride: 91 mmol/L — ABNORMAL LOW (ref 98–111)
Creatinine, Ser: 3.27 mg/dL — ABNORMAL HIGH (ref 0.44–1.00)
GFR, Estimated: 15 mL/min — ABNORMAL LOW
Glucose, Bld: 86 mg/dL (ref 70–99)
Potassium: 5.7 mmol/L — ABNORMAL HIGH (ref 3.5–5.1)
Sodium: 132 mmol/L — ABNORMAL LOW (ref 135–145)

## 2024-07-19 MED ORDER — KETOROLAC TROMETHAMINE 15 MG/ML IJ SOLN
15.0000 mg | Freq: Once | INTRAMUSCULAR | Status: AC
  Administered 2024-07-19: 15 mg via INTRAVENOUS
  Filled 2024-07-19: qty 1

## 2024-07-19 MED ORDER — ONDANSETRON HCL 4 MG/2ML IJ SOLN
4.0000 mg | Freq: Four times a day (QID) | INTRAMUSCULAR | Status: AC | PRN
  Administered 2024-07-19 (×2): 4 mg via INTRAVENOUS
  Filled 2024-07-19 (×2): qty 2

## 2024-07-19 NOTE — Progress Notes (Signed)
 " [facility]  PROGRESS NOTE   Subjective:   Patient seen sitting up in bed Left arm edema noted Remains on tube feeds  Scheduled for dialysis later today  Objective:  Vital signs: Blood pressure 100/61, pulse (!) 50, temperature (!) 97.4 F (36.3 C), temperature source Axillary, resp. rate 20, height 5' 6 (1.676 m), weight 80.3 kg, SpO2 100%.  Intake/Output Summary (Last 24 hours) at 07/19/2024 1336 Last data filed at 07/19/2024 1239 Gross per 24 hour  Intake 2919.21 ml  Output --  Net 2919.21 ml   Filed Weights   07/17/24 0500 07/18/24 0500 07/18/24 0813  Weight: 80.6 kg 78.2 kg 80.3 kg     Physical Exam: General:  No acute distress  Head:  Normocephalic, atraumatic.    Eyes:  Anicteric  Lungs:   Clear to auscultation, normal effort  Heart:  S1S2 no rubs  Abdomen:   Soft, distended  Extremities:  + peripheral edema.  Neurologic:  Awake but confused  Skin:  Left foot wound  Access:  Right femoral dialysis catheter    Basic Metabolic Panel: Recent Labs  Lab 07/13/24 0437 07/14/24 0445 07/15/24 0526 07/15/24 0600 07/16/24 0930 07/18/24 0900 07/19/24 0503  NA 130* 133* 134* 134* 132* 133* 132*  K 4.0 3.8 3.9 3.9 4.9 7.5* 5.7*  CL 89* 92* 93* 94* 93* 92* 91*  CO2 24 26 27 28 27 27 25   GLUCOSE 104* 97 107* 110* 124* 109* 86  BUN 63* 48* 37* 37* 67* 109* 64*  CREATININE 3.93* 3.00* 2.40* 2.41* 3.40* 4.88* 3.27*  CALCIUM  10.3 9.9 9.9 9.8 10.4* 11.5* 10.4*  MG 2.2 2.1 2.1  --  2.2  --   --   PHOS 4.9* 3.3 2.8  --  3.4 4.2  --    GFR: Estimated Creatinine Clearance: 19.3 mL/min (A) (by C-G formula based on SCr of 3.27 mg/dL (H)).  Liver Function Tests: Recent Labs  Lab 07/15/24 0600 07/18/24 0900  AST 21  --   ALT 20  --   ALKPHOS 82  --   BILITOT 0.8  --   PROT 6.5  --   ALBUMIN  2.9* 3.1*   No results for input(s): LIPASE, AMYLASE in the last 168 hours. No results for input(s): AMMONIA in the last 168 hours.   CBC: Recent Labs   Lab 07/13/24 0437 07/14/24 0445 07/15/24 0600 07/18/24 0900 07/19/24 0503  WBC 12.0* 13.4* 12.1* 14.7* 9.6  NEUTROABS  --   --   --  11.9*  --   HGB 9.8* 9.8* 9.6* 9.2* 9.8*  HCT 30.7* 31.0* 29.9* 29.0* 31.6*  MCV 95.0 96.3 95.2 97.3 98.1  PLT 136* 160 142* 137* 146*     HbA1C: No results found for: HGBA1C  Urinalysis: No results for input(s): COLORURINE, LABSPEC, PHURINE, GLUCOSEU, HGBUR, BILIRUBINUR, KETONESUR, PROTEINUR, UROBILINOGEN, NITRITE, LEUKOCYTESUR in the last 72 hours.  Invalid input(s): APPERANCEUR    Imaging: No results found.      Medications:    feeding supplement (NEPRO CARB STEADY) 80 mL/hr at 07/19/24 9388   sodium thiosulfate  25 g in sodium chloride  0.9 % 200 mL Infusion for Calciphylaxis Stopped (07/18/24 1324)   vancomycin  150 mL/hr at 07/18/24 1729    amiodarone   200 mg Oral Daily   apixaban   5 mg Oral BID   Chlorhexidine  Gluconate Cloth  6 each Topical Daily   collagenase   1 Application Topical Daily   free water   30 mL Per Tube Q4H   levothyroxine   75 mcg Oral Q0600   lidocaine   1 patch Transdermal Q24H   midodrine   10 mg Oral TID WC   multivitamin  1 tablet Oral QHS   nutrition supplement (JUVEN)  1 packet Oral BID BM   sodium chloride  flush  10-40 mL Intracatheter Q12H    Assessment/ Plan:     64 year old female with history of coronary disease history of CABG, ESRD, COPD, atrial fibrillation on hemodialysis Monday Wednesday and Friday now admitted with history of syncope/fall.  In hospital cardiac arrest on Sunday, 07/02/2024 Outpatient dialysis: [facility]/MWF/right PermCath/72.5 kg    ESRD: HD Monday Wednesday and Friday. Receiving dialysis today to maintain outpatient schedule. Consulted vascular for permcath placement as white count has improved. Next treatment scheduled for Friday.   ANEMIA of chronic kidney disease: Hemoglobin stable, 9.8. Will hold low dose retacrit  with dialysis.     MBD (secondary hyperparathyroidism): Maintain the patient on sodium thiosulfate  for calciphylaxis. Will continue to monitor bone minerals. Calcium  improving.    Hypotension/VOL/edema: Patient with A-fib with RVR.  Currently on heparin  drip and maintained on oral amiodarone .  Required pressors during this admission. Blood pressure soft but stable.   Acute respiratory failure-requiring ventilator postcardiac arrest on 07/02/2024.  Status post extubation 07/11/2024. On 2L [place]      LOS: [address] [facility] 2/4/20261:36 PM  "

## 2024-07-19 NOTE — Plan of Care (Signed)

## 2024-07-19 NOTE — Progress Notes (Signed)
" °   07/19/24 1329  Vitals  Temp (!) 97.3 F (36.3 C)  Temp Source Axillary  BP (!) 91/55  MAP (mmHg) 67  BP Location Right Arm  BP Method Automatic  Patient Position (if appropriate) Lying  Pulse Rate (!) 48  Pulse Rate Source Monitor  ECG Heart Rate (!) 45  Resp (!) 23  Weight 79.4 kg  Type of Weight Pre-Dialysis  Oxygen  Therapy  SpO2 96 %  O2 Device Nasal Cannula  O2 Flow Rate (L/min) 2 L/min  Patient Activity (if Appropriate) In bed  Pulse Oximetry Type Continuous  Oximetry Probe Site Changed No  Hepatitis B Pre Treatment Patient Checks  Hepatitis B Surface Antigen Results Nonreactive  Date Hepatitis B Surface Antigen Drawn 07/22/24  Hep B Antibody Quant/Post less than 3.5  Date Hep B Antibody Quant/Post Drawn 07/22/24  Patient's Immunity Status Susceptible  Isolation Initiated No    "

## 2024-07-19 NOTE — Progress Notes (Signed)
" °   07/19/24 1615  Vitals  BP (!) 94/55  MAP (mmHg) 68  Pulse Rate (!) 53  ECG Heart Rate (!) 50  Resp 19  Oxygen  Therapy  SpO2 100 %  O2 Device Nasal Cannula  O2 Flow Rate (L/min) 2 L/min  During Treatment Monitoring  Blood Flow Rate (mL/min) 400 mL/min  Arterial Pressure (mmHg) -166.66 mmHg  Venous Pressure (mmHg) 185.85 mmHg  TMP (mmHg) 8.89 mmHg  Ultrafiltration Rate (mL/min) 674 mL/min  Dialysate Flow Rate (mL/min) 300 ml/min  Duration of HD Treatment -hour(s) 2.56 hour(s)  Cumulative Fluid Removed (mL) per Treatment  1200.77  HD Safety Checks Performed Yes  Intra-Hemodialysis Comments Tx completed  Post Treatment  Dialyzer Clearance Lightly streaked  Hemodialysis Intake (mL) 0 mL  Liters Processed 67  Fluid Removed (mL) 1200 mL  Tolerated HD Treatment Yes  Post-Hemodialysis Comments Pt had to off early due to machine issues. 26 minutes remaining on tx.. Pt denies any c/o pain or discomfort at this time. Tolerated tx without any issues.  Slept thru most all the tx.  Note  Patient Observations See post HD comments  Hemodialysis Catheter Right Femoral vein Triple lumen Temporary (Non-Tunneled)  Placement Date/Time: 07/11/24 1249   Placed prior to admission: No  Ultrasound Used?: Yes  Person Inserting LDA: Dr Isadora  Orientation: Right  Access Location: Femoral vein  Hemodialysis Catheter Type: Triple lumen Temporary (Non-Tunneled)  Site Condition No complications  Blue Lumen Status Flushed;Heparin  locked  Red Lumen Status Flushed;Heparin  locked  Catheter fill solution Heparin  1000 units/ml  Catheter fill volume (Arterial) 1400 cc  Catheter fill volume (Venous) 1400  Dressing Type Transparent  Dressing Status Clean, Dry, Intact  Interventions Other (Comment)  Drainage Description None  Dressing Change Due 07/25/24  Post treatment catheter status Capped and Clamped    "

## 2024-07-19 NOTE — Progress Notes (Signed)
 OT Cancellation Note  Patient Details Name: [name] MRN: [id number] DOB: Aug 11, 1960   Cancelled Treatment:    Reason Eval/Treat Not Completed: Other (comment) (pt at HD, OT will reattempt as able)  Therisa Sheffield, OTD OTR/L  07/19/24, 3:27 PM

## 2024-07-19 NOTE — Progress Notes (Signed)
 " PROGRESS NOTE    [name]  FMW:[id number] DOB: 1961-04-19 DOA: 07/01/2024 PCP: Orlean Alan HERO, FNP  [id number]  LOS: 17 days   Brief hospital course:   Assessment & Plan: DELLE [name] is a 64 y.o. female with a pmh of of ESRD on HD (MWF), COPD, CHF, bipolar disorder, chronic hypotension on midodrine , HLD and prior CVA who presented to [facility] ED via EMS following syncopal episode.   07/02/24-transferred to ICU post CODE BLUE on medical floor status post ACLS with ROSC  07/03/24- overnight, unresposive with AF. MWF(HD). Will meet with family. Will continue to monitor and re-evaluate post dialysis.  07/04/24- patient had few episodes of VT. She is DNR code status.  She was seen by cardiology, she has poor prognosis. No procedures are planned.  07/05/24- patient had hypotensive episode during HD so it was interuppted.  The lidocaine  infusion will be dcd today and amiodarone  will continue.  Plan for paracentesis today. 07/06/24- patient with no events overnight, did have a non sustained run of VT similar to previous.  Performing awakening trial today.  Due to staph/strep bacteremia the tunnelled vascCath has to come out.  07/07/24 - started PO amiodarone , MRI brain unremarkable 07/08/24 - more awake and interactive 07/09/24 - intubated and off sedation, failed WUA 07/10/24 - more awake, following commands, extubated 07/11/24 - HD catheter placed, triple lumen removed from left groin 07/12/24 - off vasopressin  07/13/24 transferred to [facility]   #Cardiac Arrest  #Ventricular Fibrillation --cardio consulted --cont amio  #CAD  #Ischemic Cardiomyopathy  Hypotension --cont midodrine  --small bolus 250 ml for systolic <80's  #Septic Shock 2/2 Strep gordonii bacteremia- MRSA bacteremia --HD cath removed  Repeat blood cultrue X 2 NG 2 d echo no vegetations valves --cont IV Vanc --duration changed from 4 to 6 weeks to empirically cover cervical discitis   #Afib  --cont  amiodarone  --cont Eliquis   Acute metabolic encephalopathy  Likely hospital delirium --extubated with improvement in mental status, but not quite back to her baseline. MRI brain without acute pathology.   Chronic combined CHF --LVEF 25% and a dysfunctional and enlarged right ventricle.  --management per cardio   Acute on chronic respiratory failure  On 2-3L O2 at baseline --in the setting of cardiac arrest, now extubated as of 07/10/24 and currently on home 2-3L O2.  Ascites  s/p paracentesis- possible cirrhosis could be cardiac cirrhosis Cell coulnt 605 ( 44% N) Protein not sent Culture neg   ESRD on HD  --had a tunneled dialysis catheter. With concern for bacteremia this was explanted. Placed temp HD catheter in the right groin. --iHD per nephro --plan for PermCath placement  Hyperkalemia --change tube feed from Osmolite to Nephro --iHD for removal  Hyponatremia --now stable in low 130's  Possible cervical myelopathy  --pt has torticollis.  Pt is not a good candidate for surgical fixation. --soft collar to help with pain    DVT prophylaxis: On:Eliquis  Code Status: DNR  Family Communication: daughter updated at bedside today Level of care: Progressive Dispo:   The patient is from: home Anticipated d/c is to: [facility] rehab Anticipated d/c date is: after PermCath placed   Subjective and Interval History:  Pt denied pain in her neck.  In better mood today because daughter was at bedside.   Objective: Vitals:   07/19/24 1600 07/19/24 1615 07/19/24 1630 07/19/24 1708  BP: (!) 89/52 (!) 94/55 111/61 (!) 95/59  Pulse: (!) 45 (!) 53 (!) 54 73  Resp: 15 19 18  18  Temp:   (!) 97.4 F (36.3 C) (!) 97.5 F (36.4 C)  TempSrc:   Axillary   SpO2: 100% 100% 100% 98%  Weight:   78 kg   Height:        Intake/Output Summary (Last 24 hours) at 07/19/2024 1801 Last data filed at 07/19/2024 1754 Gross per 24 hour  Intake 2919.21 ml  Output 1200 ml  Net 1719.21 ml   Filed  Weights   07/18/24 0813 07/19/24 1329 07/19/24 1630  Weight: 80.3 kg 79.4 kg 78 kg    Examination:   Constitutional: NAD, alert, oriented HEENT: conjunctivae and lids normal, EOMI CV: No cyanosis.   RESP: normal respiratory effort Abdomen: Feeding tube present Extremities: edema in BUE.  Grip strength slightly improved. SKIN: warm, dry   Data Reviewed: I have personally reviewed labs and imaging studies  Time spent: 35 minutes  Vanessa Haber, MD [facility] If 7PM-7AM, please contact night-coverage 07/19/2024, 6:01 PM   "

## 2024-07-19 NOTE — Progress Notes (Signed)
 SLP Cancellation Note  Patient Details Name: [name] MRN: [id number] DOB: 03/10/1961   Cancelled treatment:       Reason Eval/Treat Not Completed: Patient at procedure or test/unavailable  Pt at HD. Will follow again tomorrow.  Telesforo Brosnahan 07/19/2024, 4:55PM

## 2024-07-19 NOTE — Progress Notes (Signed)
" °   07/19/24 1630  Vitals  Temp (!) 97.4 F (36.3 C)  Temp Source Axillary  BP 111/61  MAP (mmHg) 76  BP Location Right Arm  BP Method Automatic  Patient Position (if appropriate) Lying  Pulse Rate (!) 54  Pulse Rate Source Monitor  ECG Heart Rate 60  Resp 18  Weight 78 kg  Type of Weight Post-Dialysis  Oxygen  Therapy  SpO2 100 %  O2 Device Nasal Cannula  O2 Flow Rate (L/min) 2 L/min  Patient Activity (if Appropriate) In bed  Pulse Oximetry Type Continuous  Oximetry Probe Site Changed No    "

## 2024-07-19 NOTE — Progress Notes (Signed)
" °   07/19/24 0750  Pain Assessment  Pain Scale 0-10  Pain Score 0  Patients Stated Pain Goal 0  Hemodialysis Catheter Right Femoral vein Triple lumen Temporary (Non-Tunneled)  Placement Date/Time: 07/11/24 1249   Placed prior to admission: No  Ultrasound Used?: Yes  Person Inserting LDA: Dr Isadora  Orientation: Right  Access Location: Femoral vein  Hemodialysis Catheter Type: Triple lumen Temporary (Non-Tunneled)  Site Condition No complications  Dressing Status Antimicrobial disc/dressing in place;Clean, Dry, Intact  Fistula / Graft Left Upper arm Arteriovenous fistula  No Placement Date or Time found.   Placed prior to admission: Yes  Orientation: Left  Access Location: Upper arm  Access Type: Arteriovenous fistula  Site Condition No complications  Fistula / Graft Assessment Present;Thrill;Bruit  Status Deaccessed  Neurological  Level of Consciousness Alert  Orientation Level Oriented to person;Oriented to place  Respiratory  Respiratory Pattern Regular;Unlabored  Chest Assessment Chest expansion symmetrical  Bilateral Breath Sounds Clear;Diminished  Cardiac  Pulse Regular  Heart Sounds S1, S2  Jugular Venous Distention (JVD) No  ECG Monitor Yes  Antiarrhythmic device No  Vascular  R Radial Pulse +1  L Radial Pulse +1  R Dorsalis Pedis Pulse +1  L Dorsalis Pedis Pulse +1  Edema Right upper extremity;Left upper extremity;Right lower extremity;Left lower extremity  Generalized Edema +2  RUE Edema +2  LUE Edema +2  RLE Edema +2  LLE Edema +2  Psychosocial  Psychosocial (WDL) WDL  Patient Behaviors Cooperative;Calm  Needs Expressed Denies    "

## 2024-07-19 NOTE — Progress Notes (Signed)
 [facility] CLINIC CARDIOLOGY PROGRESS NOTE   Patient ID: [name] MRN: [id number] DOB/AGE: 17-Jul-1960 64 y.o.  Admit date: 07/01/2024 Referring Physician Dr. Devaughn [name]  Primary Physician Vanessa Alan HERO, FNP  Primary Cardiologist Dr. Denyse [name] Reason for Consultation syncope  HPI: [name] is a 64 y.o. female with a past medical history of chronic HFrEF, coronary artery disease, atrial fibrillation on eliquis , COPD, ESRD on HD, bipolar disorder, hypotension on midodrine , hx CVA  who presented to the ED on 07/01/2024 for syncopal event at home. Cardiology was consulted for further evaluation.   Interval History:  - Patient seen and examined this AM.  Resting in bed, does not arouse to my presence. Nurse at bedside reports she was awake earlier and had no complaints.  - Sinus bradycardia on tele with occasional PVCs. - BP and HR stable.   Review of systems complete and found to be negative unless listed above   Vitals:   07/19/24 0430 07/19/24 0432 07/19/24 0440 07/19/24 0755  BP:  107/63  (!) 92/53  Pulse:    (!) 50  Resp: 20 18 18 16   Temp:  97.8 F (36.6 C)  (!) 97.5 F (36.4 C)  TempSrc:      SpO2:  100%  100%  Weight:      Height:         Intake/Output Summary (Last 24 hours) at 07/19/2024 9072 Last data filed at 07/19/2024 0844 Gross per 24 hour  Intake 2889.21 ml  Output 1000 ml  Net 1889.21 ml     PHYSICAL EXAM General: Ill appearing female, no acute distress.  HEENT: Normocephalic and atraumatic. Neck: No JVD.  Lungs: Diminished bilaterally.  Heart: HRRR. Normal S1 and S2 without gallops or murmurs. Radial & DP pulses 2+ bilaterally. Abdomen: Non-distended appearing.  Msk: Normal strength and tone for age. Extremities: No clubbing, cyanosis or edema.     LABS: Basic Metabolic Panel: Recent Labs    07/16/24 0930 07/18/24 0900 07/19/24 0503  NA 132* 133* 132*  K 4.9 7.5* 5.7*  CL 93* 92* 91*  CO2 27 27 25   GLUCOSE 124* 109* 86  BUN  67* 109* 64*  CREATININE 3.40* 4.88* 3.27*  CALCIUM  10.4* 11.5* 10.4*  MG 2.2  --   --   PHOS 3.4 4.2  --    Liver Function Tests: Recent Labs    07/18/24 0900  ALBUMIN  3.1*    No results for input(s): LIPASE, AMYLASE in the last 72 hours. CBC: Recent Labs    07/18/24 0900 07/19/24 0503  WBC 14.7* 9.6  NEUTROABS 11.9*  --   HGB 9.2* 9.8*  HCT 29.0* 31.6*  MCV 97.3 98.1  PLT 137* 146*   Cardiac Enzymes: No results for input(s): CKTOTAL, CKMB, CKMBINDEX, TROPONINIHS in the last 72 hours. BNP: No results for input(s): BNP in the last 72 hours. D-Dimer: No results for input(s): DDIMER in the last 72 hours. Hemoglobin A1C: No results for input(s): HGBA1C in the last 72 hours. Fasting Lipid Panel: No results for input(s): CHOL, HDL, LDLCALC, TRIG, CHOLHDL, LDLDIRECT in the last 72 hours.  Thyroid  Function Tests: No results for input(s): TSH, T4TOTAL, T3FREE, THYROIDAB in the last 72 hours.  Invalid input(s): FREET3 Anemia Panel: No results for input(s): VITAMINB12, FOLATE, FERRITIN, TIBC, IRON, RETICCTPCT in the last 72 hours.  No results found.     ECHO 06/23/2024: 1. Left ventricular ejection fraction, by estimation, is 25 to 30%. The left ventricle has severely decreased function.  The left ventricle demonstrates global hypokinesis. The left ventricular internal cavity size was severely dilated. Left ventricular diastolic function could not be evaluated.   2. Right ventricular systolic function is severely reduced. The right  ventricular size is severely enlarged. Mildly increased right ventricular  wall thickness.   3. Left atrial size was mildly dilated.   4. Right atrial size was severely dilated.   5. The mitral valve is normal in structure. Mild mitral valve regurgitation.   6. Tricuspid valve regurgitation is severe.   7. The aortic valve is grossly normal. Aortic valve regurgitation is not visualized.  Aortic valve sclerosis is present, with no evidence of aortic  valve stenosis.   TELEMETRY (personally reviewed): Sinus rhythm rate 50s, PVCs  EKG (personally reviewed): AF RBBB rate 167 bpm  DATA reviewed by me 07/19/24: last 24h vitals tele labs imaging I/O, PCCM progress note  Principal Problem:   Syncope and collapse Active Problems:   ESRD on hemodialysis (HCC)   Chronic hypotension   S/P CABG x 3   Mild dementia associated with other underlying disease, without behavioral disturbance, psychotic disturbance, mood disturbance, or anxiety (HCC)   Anemia of chronic disease   ESRD (end stage renal disease) (HCC)   Paroxysmal atrial fibrillation (HCC)   Hypothyroidism   Chronic combined systolic and diastolic CHF (congestive heart failure) (HCC)   Hepatitis C antibody positive in blood   Other cirrhosis of liver (HCC)   Hypertension   Bipolar disorder in full remission, most recent episode unspecified type   Chronic pain syndrome   Other secondary kyphosis, cervical region   Cervical spondylosis   Fall   Acute pulmonary edema (HCC)   Bacteremia due to Streptococcus   MRSA bacteremia   ESRD (end stage renal disease) on dialysis (HCC)   Acute on chronic respiratory failure with hypoxia (HCC)   Acute on chronic HFrEF (heart failure with reduced ejection fraction) (HCC)   Cardiac arrest with ventricular fibrillation (HCC)   Toxic metabolic encephalopathy   Pressure injury of skin   Discitis of cervical region    ASSESSMENT AND PLAN: [name] is a 64 y.o. female with a past medical history of chronic HFrEF, coronary artery disease, atrial fibrillation on eliquis , COPD, ESRD on HD, bipolar disorder, hypotension on midodrine , hx CVA  who presented to the ED on 07/01/2024 for syncopal event at home. Cardiology was consulted for further evaluation.   # Atrial fibrillation RVR # Paroxysmal atrial fibrillation # Syncope/ ?cardiac arrest # Chronic HFrEF # Coronary artery  disease Patient brought to ED after being found down at home by family. Initially was admitted to the floor but on 07/02/24 she became unresponsive and was tachycardia, concern for wide complex tachycardia but has RBBB at baseline. Underwent ACLS, received 1 shock and 2 doses of epi with ROSC. Started on IV amiodarone .  Lidocaine  started 07/04/2024 by EP.  Overnight on 07/05/2024 developed bradycardia and thus IV amiodarone  was discontinued. 07/05/24 EP discontinued lidocaine  and restarted amiodarone .  -EP signed off, appreciate their assistance. Given bacteremia and encephalopathy would not be candidate for device at this time.  -Continue eliquis  5 mg twice daily. -Continue PO amiodarone  200 mg daily. -Further management per primary team, appreciate their assistance. No plan for invasive cardiac procedures at this time. Discussed with patient's daughter 07/11/24 that overall prognosis is poor, daughter expressed that family agrees with planning for medical management with no procedures.  -Agree with palliative care evaluation for continued goals of care conversations.  This patient's case was discussed and created with Dr. Florencio and he is in agreement.  Signed:  Danita Bloch, PA-C  07/19/2024, 9:27 AM [facility] Cardiology

## 2024-07-19 NOTE — Plan of Care (Signed)
 VSS w/ BP remaining on the lower side. Patient went to HDU. See notes. Wound dressings done per order. Family at bedside this shift. Patient mentation continues to wax and wane.  Problem: Education: Goal: Knowledge of General Education information will improve Description: Including pain rating scale, medication(s)/side effects and non-pharmacologic comfort measures Outcome: Progressing   Problem: Health Behavior/Discharge Planning: Goal: Ability to manage health-related needs will improve Outcome: Progressing   Problem: Clinical Measurements: Goal: Ability to maintain clinical measurements within normal limits will improve Outcome: Progressing Goal: Will remain free from infection Outcome: Progressing Goal: Diagnostic test results will improve Outcome: Progressing Goal: Respiratory complications will improve Outcome: Progressing Goal: Cardiovascular complication will be avoided Outcome: Progressing   Problem: Activity: Goal: Risk for activity intolerance will decrease Outcome: Progressing   Problem: Nutrition: Goal: Adequate nutrition will be maintained Outcome: Progressing   Problem: Coping: Goal: Level of anxiety will decrease Outcome: Progressing   Problem: Elimination: Goal: Will not experience complications related to bowel motility Outcome: Progressing Goal: Will not experience complications related to urinary retention Outcome: Progressing   Problem: Pain Managment: Goal: General experience of comfort will improve and/or be controlled Outcome: Progressing   Problem: Safety: Goal: Ability to remain free from injury will improve Outcome: Progressing   Problem: Skin Integrity: Goal: Risk for impaired skin integrity will decrease Outcome: Progressing

## 2024-07-19 NOTE — Progress Notes (Signed)
 Nutrition Follow-up  DOCUMENTATION CODES:   Not applicable  INTERVENTION:   -Continue dysphagia 2 diet with thin liquids; RD will follow for diet advancement and adjust supplement regimen as appropriate -Continue Magic cup TID with meals, each supplement provides 290 kcal and 9 grams of protein  -Continue feeding assistance with meals -Continue nocturnal feeds via NGT:    Nepro @ 80 ml/hr x 12 hours (2000-0800)    60 ml Proosurce TF20 daily   30 ml free water  flush every 4 hours   Tube feeding regimen provides 1808 kcal (100% of needs), 198 grams of protein, and 698 ml of H2O. Total free water : 878 ml daily   -Continue 1 packet Juven BID, each packet provides 95 calories, 2.5 grams of protein (collagen), and 9.8 grams of carbohydrate (3 grams sugar); also contains 7 grams of L-arginine and L-glutamine, 300 mg vitamin C , 15 mg vitamin E, 1.2 mcg vitamin B-12, 9.5 mg zinc, 200 mg calcium , and 1.5 g  Calcium  Beta-hydroxy-Beta-methylbutyrate to support wound healing  -Care plan changes communicated with RN, nephrology, and MD  NUTRITION DIAGNOSIS:   Inadequate oral intake related to inability to eat (pt sedated and ventilated) as evidenced by NPO status.  Ongoing  GOAL:   Patient will meet greater than or equal to 90% of their needs  Met with TF  MONITOR:   Diet advancement, Labs, Weight trends, TF tolerance, Skin, I & O's  REASON FOR ASSESSMENT:   Ventilator    ASSESSMENT:   64 y/o female with h/o ESRD on HD, CAD s/p CABG, ischemic cardiomyopathy, dementia, CVA, PVD, PAF, COPD, HLD, hypothyroid disease, CHF, HCV, cirrhosis, HTN,  DDD, chronic pain, GERD, bipolar disorder, gout, diverticulosis and pulmonary hypertension who is admitted with cardiac arrest, bacteremia and septic shock.  1/26- extubated, NGT placed 1/28- TF initiated at trickle rate 1/30- s/p BSE- dysphagia 2 diet with thin liquids, calorie count initiated, transitioned to nocturnal feeds 2/2- calorie  count completed (consuming 7% of needs), continue nocturnal feeds per MD  Reviewed I/O's: +1.9 L x 24 hours and +11.3 L since 07/05/24  Per [facility] notes, patient with medical device related pressure injury to left nare.   SLP following for diet upgrade. Per nephrology notes, plan for HD later today. Noted patient's K was 7.5 yesterday and 5.2 today. Nephrology added 1 K bath to treatment yesterday. Formula was switched to Nepro on 07/18/24.  Patient remains on a dysphagia 2 diet with minimal intake. No meal intake recorded since 07/17/24. Noted patient's breakfast tray was untouched at time of visit.   Patient sleeping soundly at time of visit. She did not arouse to touch. Noted that patient's head was tilted to the right side.   Case discussed with RN and MD, who report patient developed a device related pressure injury from bridle. MD requested RD assess the bridle. NGT present in left nare and bridle was a pinky's width apart. Noted large amount of residue on bridle ane left nare; pressure injury also present on inner part of left nose. RD discussed findings with lead RD/ supervisor. The following recommendations were communicated to the team: 1) Clean inside the nose and bridle. The bridle is made of silicone which was developed to help with nose care. The bridle itself should be a pinky's width apart, which it was based on RD assessment; 2) Consider the weight of the feeding set.as sometimes the weight of the feeding set may pull the tube and/or bridle to one side. Could consider taping the  NGT to side of the face to help with weight pull; 3) Consider replacing the bridle altogether (ICU RN can assist).   Patient remains on nocturnal feeds for supplemental nutrition due to prolonged poor oral intake. Case discussed with SLP, who plans to re-assess patient, but suspects poor oral intake may be related to patient's mental status.   Palliative care following for goals of care discussions. Patient is  DNR/DNI and to continue supportive interventions. Family wants to allow time for outcomes.   Medications reviewed and include midodrine .   Labs reviewed: Na: 132, K: 5.7, CBGS: 82-110 (inpatient orders for glycemic control are none).    Diet Order:   Diet Order             DIET DYS 2 Room service appropriate? Yes with Assist; Fluid consistency: Thin  Diet effective now                   EDUCATION NEEDS:   No education needs have been identified at this time  Skin:  Skin Assessment: Skin Integrity Issues: Skin Integrity Issues:: Other (Comment) Other: device related pressure injury to left nare  Last BM:  07/18/24  Height:   Ht Readings from Last 1 Encounters:  07/01/24 5' 6 (1.676 m)    Weight:   Wt Readings from Last 1 Encounters:  07/19/24 79.4 kg    Ideal Body Weight:  59 kg  BMI:  Body mass index is 28.25 kg/m.  Estimated Nutritional Needs:   Kcal:  1800-2100kcal/day  Protein:  90-105g/day  Fluid:  UOP +1L    Margery ORN, RD, LDN, CDCES Registered Dietitian III Certified Diabetes Care and Education Specialist If unable to reach this RD, please use RD Inpatient group chat on secure chat between hours of 8am-4 pm daily

## 2024-07-19 NOTE — Progress Notes (Signed)
" °   07/19/24 1329  Vitals  Temp (!) 97.3 F (36.3 C)  Temp Source Axillary  BP (!) 91/55  MAP (mmHg) 67  BP Location Right Arm  BP Method Automatic  Patient Position (if appropriate) Lying  Pulse Rate (!) 48  Pulse Rate Source Monitor  ECG Heart Rate (!) 45  Resp (!) 23  Weight 79.4 kg  Type of Weight Pre-Dialysis  Oxygen  Therapy  SpO2 96 %  O2 Device Nasal Cannula  O2 Flow Rate (L/min) 2 L/min  Patient Activity (if Appropriate) In bed  Pulse Oximetry Type Continuous  Oximetry Probe Site Changed No  Hepatitis B Pre Treatment Patient Checks  Hepatitis B Surface Antigen Results Nonreactive  Date Hepatitis B Surface Antigen Drawn 07/01/24  Hep B Antibody Quant/Post less than 3.5  Date Hep B Antibody Quant/Post Drawn 07/01/24  Patient's Immunity Status Susceptible  Isolation Initiated No    "

## 2024-07-19 NOTE — Progress Notes (Signed)
" °   07/19/24 1315  Time-Out for Dialysis  What Procedure? Hemodialysis  Pt Identifiers(min of two) First/Last Name;MRN/Account#  Correct Site? Yes  Correct Side? Yes  Correct Procedure? Yes  Consents Verified? Yes  Rad Studies Available? N/A  Safety Precautions Reviewed? Yes  Biochemist, Clinical Number 6  Bay/Room Number 3  Alarm Test Passed  Chlorine/Chloramine Test Passed  Conductivity: Machine  14  Dialyzer Lot Number [id number]  Disposable Set Lot Number [id number]  Dialysate Acid Bath Lot Number [id number]  Dialysate HCO3 Bath Lot Number [id number]  Machine Temperature 96.8 F (36 C)  Pre Treatment  Is pt a NEW START this admission?  No  Name of patient's outpatient clinic: [facility]  What is patient's outpatient schedule? MWF  Vascular access used during treatment Catheter  HD catheter dressing before treatment WDL  Patient is receiving dialysis in a chair No  Hemodialysis Consent Verified Yes  Hemodialysis Treatment Protocol  Yes  ECG (Telemetry) Monitor On Yes  Prime Ordered Normal Saline  Length of  DialysisTreatment -hour(s) 3 Hour(s)  Dialysis mode HD  Dialysis Treatment Comments Pt awake and alert. No complaints voiced  Dialyzer Revaclear 400  Dialysate 2.5 Ca;2K  Dialysis Anticoagulation Automated NS Flushes  Dialysate Flow Ordered 300  Blood Flow Rate Ordered 400 mL/min  Ultrafiltration Goal (mL) 1500 mL  Dialysis Blood Pressure Support Ordered Normal Saline    "

## 2024-07-20 LAB — CBC
HCT: 30.7 % — ABNORMAL LOW (ref 36.0–46.0)
Hemoglobin: 9.5 g/dL — ABNORMAL LOW (ref 12.0–15.0)
MCH: 30.1 pg (ref 26.0–34.0)
MCHC: 30.9 g/dL (ref 30.0–36.0)
MCV: 97.2 fL (ref 80.0–100.0)
Platelets: 147 10*3/uL — ABNORMAL LOW (ref 150–400)
RBC: 3.16 MIL/uL — ABNORMAL LOW (ref 3.87–5.11)
RDW: 20.7 % — ABNORMAL HIGH (ref 11.5–15.5)
WBC: 8.3 10*3/uL (ref 4.0–10.5)
nRBC: 0.4 % — ABNORMAL HIGH (ref 0.0–0.2)

## 2024-07-20 LAB — BASIC METABOLIC PANEL WITH GFR
Anion gap: 19 — ABNORMAL HIGH (ref 5–15)
BUN: 51 mg/dL — ABNORMAL HIGH (ref 8–23)
CO2: 25 mmol/L (ref 22–32)
Calcium: 10.2 mg/dL (ref 8.9–10.3)
Chloride: 91 mmol/L — ABNORMAL LOW (ref 98–111)
Creatinine, Ser: 2.97 mg/dL — ABNORMAL HIGH (ref 0.44–1.00)
GFR, Estimated: 17 mL/min — ABNORMAL LOW
Glucose, Bld: 92 mg/dL (ref 70–99)
Potassium: 4.9 mmol/L (ref 3.5–5.1)
Sodium: 135 mmol/L (ref 135–145)

## 2024-07-20 LAB — GLUCOSE, CAPILLARY
Glucose-Capillary: 87 mg/dL (ref 70–99)
Glucose-Capillary: 87 mg/dL (ref 70–99)
Glucose-Capillary: 87 mg/dL (ref 70–99)
Glucose-Capillary: 85 mg/dL (ref 70–99)
Glucose-Capillary: 95 mg/dL (ref 70–99)

## 2024-07-20 MED ORDER — MEDIHONEY WOUND/BURN DRESSING EX PSTE
1.0000 | PASTE | Freq: Every day | CUTANEOUS | Status: AC
  Administered 2024-07-20 – 2024-07-21 (×2): 1 via TOPICAL
  Filled 2024-07-20: qty 44

## 2024-07-20 MED ORDER — ACETAMINOPHEN 500 MG PO TABS
1000.0000 mg | ORAL_TABLET | Freq: Three times a day (TID) | ORAL | Status: DC | PRN
  Filled 2024-07-20: qty 2

## 2024-07-20 MED ORDER — DICLOFENAC SODIUM 1 % EX GEL
2.0000 g | Freq: Four times a day (QID) | CUTANEOUS | Status: AC
  Administered 2024-07-20 – 2024-07-21 (×3): 2 g via TOPICAL
  Filled 2024-07-20: qty 100

## 2024-07-20 MED ORDER — CEFAZOLIN SODIUM-DEXTROSE 1-4 GM/50ML-% IV SOLN
1.0000 g | INTRAVENOUS | Status: AC
  Administered 2024-07-21: 1 g via INTRAVENOUS
  Filled 2024-07-20: qty 50

## 2024-07-20 MED ORDER — SODIUM CHLORIDE 0.9 % IV SOLN: INTRAVENOUS | Status: DC

## 2024-07-20 MED ORDER — EPOETIN ALFA-EPBX 10000 UNIT/ML IJ SOLN
4000.0000 [IU] | INTRAMUSCULAR | Status: AC
  Administered 2024-07-21: 4000 [IU] via INTRAVENOUS
  Filled 2024-07-20 (×2): qty 1

## 2024-07-20 NOTE — Progress Notes (Signed)
 Occupational Therapy Treatment Patient Details Name: [name] MRN: [id number] DOB: 1960-09-15 Today's Date: 07/20/2024   History of present illness [name] is a 63yoF who presents to ED on 07/01/24 for weakness and fall. Recent admit 1/8-1/14 for fluid overload and cardiogenic shock with syncope and collapse. Cardiac arrest on 1/18. Intubated 1/18-1/26.  PMH: COPD, ESRD on HD, CHF, bipolar disorder, chronic hypotension on midodrine , prior CVA, PVD, CAD s/p CABG x3, Afib on Eliquis .   OT comments  Pt seen for OT treatment on this date. Upon arrival to room pt laying in bed, agreeable to tx session targeting improving functional activity tolerance in prep for ADL tasks. Pt requires Max a donning socks. Fair static sitting balance. Max a +2 sit<>stand from EOB x2 trials with both knees blocked. Pt needing verbal cueing to maintain posture when standing. Max a for pericare and rolling in bed. Pt complained of chest pain, RN to assess. Appears to have resolved post change in position. VSS and O2 stayed above 90% on 2L [place]. Pt limited by pain during treatment limiting continued functional mobility. Pt making progress toward goals, will continue to follow POC. Discharge recommendation remains appropriate.        If plan is discharge home, recommend the following:  Two people to help with walking and/or transfers;Two people to help with bathing/dressing/bathroom;Supervision due to cognitive status   Equipment Recommendations  Other (comment) (Defer)    Recommendations for Other Services      Precautions / Restrictions Precautions Precautions: Fall Recall of Precautions/Restrictions: Intact Restrictions Weight Bearing Restrictions Per Provider Order: No       Mobility Bed Mobility Overal bed mobility: Needs Assistance Bed Mobility: Supine to Sit, Sit to Supine, Rolling Rolling: Max assist, +2 for physical assistance   Supine to sit: Max assist, +2 for physical assistance Sit  to supine: Max assist, +2 for physical assistance        Transfers Overall transfer level: Needs assistance Equipment used: None Transfers: Sit to/from Stand Sit to Stand: Max assist, +2 physical assistance                 Balance Overall balance assessment: Needs assistance Sitting-balance support: Feet supported, No upper extremity supported Sitting balance-Leahy Scale: Fair     Standing balance support: Bilateral upper extremity supported, During functional activity Standing balance-Leahy Scale: Zero                             ADL either performed or assessed with clinical judgement   ADL Overall ADL's : Needs assistance/impaired                                       General ADL Comments: Max a donning socks. Fair static sitting balance. Max a +2 sit<>stand from EOB x2 trials with both knees blocked. Pt needing verbal cueing to maintain posture when standing. Max a for pericare and rolling in bed. Pt limited by pain during treatment limiting continued functional mobility.    Extremity/Trunk Assessment              Vision       Haematologist Communication Communication: Impaired Factors Affecting Communication: Difficulty expressing self;Reduced clarity of speech   Cognition Arousal: Alert Behavior During Therapy: WFL for tasks assessed/performed Cognition: Cognition impaired  Orientation impairments: Time Awareness: Intellectual awareness impaired                         Following commands: Intact        Cueing   Cueing Techniques: Verbal cues, Tactile cues  Exercises      Shoulder Instructions       General Comments      Pertinent Vitals/ Pain       Pain Assessment Pain Assessment: Faces Faces Pain Scale: Hurts even more Pain Location: L foot Pain Descriptors / Indicators: Discomfort, Aching, Grimacing Pain Intervention(s): Monitored during session  Home Living                                           Prior Functioning/Environment              Frequency  Min 2X/week        Progress Toward Goals  OT Goals(current goals can now be found in the care plan section)  Progress towards OT goals: Progressing toward goals  Acute Rehab OT Goals Patient Stated Goal: go home OT Goal Formulation: With patient Time For Goal Achievement: 08/03/24 Potential to Achieve Goals: Fair  Plan      Co-evaluation                 AM-PAC OT 6 Clicks Daily Activity     Outcome Measure   Help from another person eating meals?: A Lot Help from another person taking care of personal grooming?: A Lot Help from another person toileting, which includes using toliet, bedpan, or urinal?: A Lot Help from another person bathing (including washing, rinsing, drying)?: A Lot Help from another person to put on and taking off regular upper body clothing?: A Lot Help from another person to put on and taking off regular lower body clothing?: A Lot 6 Click Score: 12    End of Session Equipment Utilized During Treatment: Oxygen   OT Visit Diagnosis: Unsteadiness on feet (R26.81);Repeated falls (R29.6);Muscle weakness (generalized) (M62.81)   Activity Tolerance Patient limited by pain   Patient Left in bed;with call bell/phone within reach;with bed alarm set   Nurse Communication          Time: [phone] OT Time Calculation (min): 27 min  Charges: OT General Charges $OT Visit: 1 Visit OT Treatments $Self Care/Home Management : 8-22 mins $Therapeutic Activity: 8-22 mins  Yaeko Fazekas OTS   Ronita Sauers 07/20/2024, 4:19 PM

## 2024-07-20 NOTE — Progress Notes (Signed)
 " PROGRESS NOTE    [name]  FMW:[id number] DOB: 01-05-1961 DOA: 07/01/2024 PCP: Orlean Alan HERO, FNP  [id number]  LOS: 18 days   Brief hospital course:   Assessment & Plan: [name] is a 64 y.o. female with a pmh of of ESRD on HD (MWF), COPD, CHF, bipolar disorder, chronic hypotension on midodrine , HLD and prior CVA who presented to [facility] ED via EMS following syncopal episode.   07/02/24-transferred to ICU post CODE BLUE on medical floor status post ACLS with ROSC  07/03/24- overnight, unresposive with AF. MWF(HD). Will meet with family. Will continue to monitor and re-evaluate post dialysis.  07/04/24- patient had few episodes of VT. She is DNR code status.  She was seen by cardiology, she has poor prognosis. No procedures are planned.  07/05/24- patient had hypotensive episode during HD so it was interuppted.  The lidocaine  infusion will be dcd today and amiodarone  will continue.  Plan for paracentesis today. 07/06/24- patient with no events overnight, did have a non sustained run of VT similar to previous.  Performing awakening trial today.  Due to staph/strep bacteremia the tunnelled vascCath has to come out.  07/07/24 - started PO amiodarone , MRI brain unremarkable 07/08/24 - more awake and interactive 07/09/24 - intubated and off sedation, failed WUA 07/10/24 - more awake, following commands, extubated 07/11/24 - HD catheter placed, triple lumen removed from left groin 07/12/24 - off vasopressin  07/13/24 transferred to [facility]   #Cardiac Arrest  #Ventricular Fibrillation --cardio consulted --cont amio  #CAD  #Ischemic Cardiomyopathy  Hypotension --cont midodrine  --small bolus 250 ml for systolic <80's  #Septic Shock 2/2 Strep gordonii bacteremia- MRSA bacteremia --HD cath removed  Repeat blood cultrue X 2 NG 2 d echo no vegetations valves --cont IV Vanc --duration 6 weeks  #Afib  --cont amiodarone  --cont Eliquis   Acute metabolic encephalopathy  Likely  hospital delirium --extubated with improvement in mental status, but not quite back to her baseline. MRI brain without acute pathology.   Chronic combined CHF --LVEF 25% and a dysfunctional and enlarged right ventricle.  --management per cardio   Acute on chronic respiratory failure  On 2-3L O2 at baseline --in the setting of cardiac arrest, now extubated as of 07/10/24 and currently on home 2-3L O2.  Ascites  s/p paracentesis- possible cirrhosis could be cardiac cirrhosis Cell coulnt 605 ( 44% N) Protein not sent Culture neg   ESRD on HD  --had a tunneled dialysis catheter. With concern for bacteremia this was explanted. Placed temp HD catheter in the right groin. --iHD per nephro --vascular for PermCath placement  Hyperkalemia --changed tube feed from Osmolite to Nephro --iHD for removal  Hyponatremia --now stable in low 130's  Possible cervical myelopathy  --pt has torticollis.  Pt is not a good candidate for surgical fixation. --soft collar to help with pain  Poor oral intake --advance to regular diet today --cont nocturnal tube feeds    DVT prophylaxis: On:Eliquis  Code Status: DNR  Family Communication:  Level of care: Progressive Dispo:   The patient is from: home Anticipated d/c is to: [facility] rehab Anticipated d/c date is: after PermCath placed   Subjective and Interval History:  Pt reported neck more sore today.     Objective: Vitals:   07/20/24 0754 07/20/24 1112 07/20/24 1459 07/20/24 1553  BP: (!) 93/57 (!) 101/54  (!) 96/47  Pulse: (!) 50 (!) 47 (!) 49 (!) 48  Resp: 18 19  18   Temp: 98.8 F (37.1 C) 99.6 F (  37.6 C)  98.2 F (36.8 C)  TempSrc:      SpO2: 100% 100%  96%  Weight:      Height:        Intake/Output Summary (Last 24 hours) at 07/20/2024 1906 Last data filed at 07/20/2024 1731 Gross per 24 hour  Intake 1001.79 ml  Output --  Net 1001.79 ml   Filed Weights   07/19/24 1329 07/19/24 1630 07/20/24 0500  Weight: 79.4 kg 78 kg 78  kg    Examination:   Constitutional: NAD, alert, oriented HEENT: conjunctivae and lids normal, EOMI CV: No cyanosis.   RESP: normal respiratory effort Extremities: edema in BUE improved SKIN: warm, dry   Data Reviewed: I have personally reviewed labs and imaging studies  Time spent: 35 minutes  Ellouise Haber, MD [facility] If 7PM-7AM, please contact night-coverage 07/20/2024, 7:06 PM   "

## 2024-07-20 NOTE — Progress Notes (Signed)
 Physical Therapy Treatment Patient Details Name: [name] MRN: [id number] DOB: 23-Feb-1961 Today's Date: 07/20/2024   History of Present Illness [name] is a 63yoF who presents to ED on 07/01/24 for weakness and fall. Recent admit 1/8-1/14 for fluid overload and cardiogenic shock with syncope and collapse. Cardiac arrest on 1/18. Intubated 1/18-1/26.  PMH: COPD, ESRD on HD, CHF, bipolar disorder, chronic hypotension on midodrine , prior CVA, PVD, CAD s/p CABG x3, Afib on Eliquis .    PT Comments  Pt remains globally weak in 4 limbs, improve A/ROM of BLE today, now a solid 3/5 in quads, however BUE remain 3-/5 making it labor some, often unsuccessful using limbs to operate mobile phone, call bell, personal items. Pt able to achieve very upright posturing in bed, facilitating pulling postural strengthening, noted passing of post-mobility eructation. Bed features used to promote plantar surface weightbearing on footboard. Pt making slow progress overall but with notable improvements. Remains TotalA +2 for gross mobility.     If plan is discharge home, recommend the following: Two people to help with walking and/or transfers;Two people to help with bathing/dressing/bathroom;Assistance with feeding;Assistance with cooking/housework;Direct supervision/assist for medications management;Direct supervision/assist for financial management;Assist for transportation;Help with stairs or ramp for entrance;Supervision due to cognitive status   Can travel by private vehicle     No  Equipment Recommendations  None recommended by PT    Recommendations for Other Services       Precautions / Restrictions Precautions Precautions: Fall Recall of Precautions/Restrictions: Impaired Restrictions Weight Bearing Restrictions Per Provider Order: No     Mobility  Bed Mobility Overal bed mobility: Needs Assistance   Rolling: Total assist, +2 for physical assistance   Supine to sit: Total assist, +2  for physical assistance Sit to supine: +2 for physical assistance, Total assist        Transfers                        Ambulation/Gait                   Stairs             Wheelchair Mobility     Tilt Bed    Modified Rankin (Stroke Patients Only)       Balance                                            Communication    Cognition Arousal: Alert (drowsy)     PT - Cognitive impairments: No family/caregiver present to determine baseline                                Cueing    Exercises Other Exercises Other Exercises: RUE elbow and shoulder AA/ROM x15 each (reclined to BUE pull to upright short sitting 2x5;) Other Exercises: LUE elbow and shoulder P/ROM x15 each (sustained upright sitting at 70 degrees HOB, footbaord set up to allow weightbearing bilat) Other Exercises: BLE SAQ 1x15 bilat Other Exercises: short sitting trunk rotation 1x30sec bilat for back scratching assist Other Exercises: short sitting cervical rotation A/ROM x5 bilat    General Comments        Pertinent Vitals/Pain Pain Assessment Pain Assessment: No/denies pain    Home Living  Prior Function            PT Goals (current goals can now be found in the care plan section) Acute Rehab PT Goals Patient Stated Goal: be able to stand up, walk again PT Goal Formulation: With patient Time For Goal Achievement: 07/26/24 Potential to Achieve Goals: Fair Progress towards PT goals: Progressing toward goals    Frequency    Min 2X/week      PT Plan      Co-evaluation              AM-PAC PT 6 Clicks Mobility   Outcome Measure  Help needed turning from your back to your side while in a flat bed without using bedrails?: Total Help needed moving from lying on your back to sitting on the side of a flat bed without using bedrails?: Total Help needed moving to and from a bed to a chair  (including a wheelchair)?: Total Help needed standing up from a chair using your arms (e.g., wheelchair or bedside chair)?: Total Help needed to walk in hospital room?: Total Help needed climbing 3-5 steps with a railing? : Total 6 Click Score: 6    End of Session Equipment Utilized During Treatment: Oxygen  Activity Tolerance: Patient limited by fatigue;Patient tolerated treatment well;No increased pain Patient left: in bed;with call bell/phone within reach;with nursing/sitter in room Nurse Communication: Other (comment) PT Visit Diagnosis: Other abnormalities of gait and mobility (R26.89);Pain;Difficulty in walking, not elsewhere classified (R26.2) Pain - Right/Left: Left Pain - part of body: Ankle and joints of foot     Time: 1430-1457 PT Time Calculation (min) (ACUTE ONLY): 27 min  Charges:    $Therapeutic Exercise: 23-37 mins PT General Charges $$ ACUTE PT VISIT: 1 Visit                    3:14 PM, 07/20/24 Peggye JAYSON Linear, PT, DPT Physical Therapist - [facility]  [phone] (ASCOM)    Zevin Nevares C 07/20/2024, 3:10 PM

## 2024-07-20 NOTE — H&P (View-Only) (Signed)
 " [facility]  PROGRESS NOTE   Subjective:   Patient seen resting in bed Easily aroused Continues to have weakness  Tube feeds infusing.   Objective:  Vital signs: Blood pressure (!) 101/54, pulse (!) 47, temperature 99.6 F (37.6 C), resp. rate 19, height 5' 6 (1.676 m), weight 78 kg, SpO2 100%.  Intake/Output Summary (Last 24 hours) at 07/20/2024 1425 Last data filed at 07/20/2024 1423 Gross per 24 hour  Intake 971.79 ml  Output 1200 ml  Net -228.21 ml   Filed Weights   07/19/24 1329 07/19/24 1630 07/20/24 0500  Weight: 79.4 kg 78 kg 78 kg     Physical Exam: General:  No acute distress  Head:  Normocephalic, atraumatic.    Eyes:  Anicteric  Lungs:   Clear to auscultation, normal effort  Heart:  S1S2 no rubs  Abdomen:   Soft, distended  Extremities:  + peripheral edema. BUE edema L>R  Neurologic:  Awake   Skin:  Left foot wound  Access:  Right femoral dialysis catheter    Basic Metabolic Panel: Recent Labs  Lab 07/14/24 0445 07/15/24 0526 07/15/24 0600 07/16/24 0930 07/18/24 0900 07/19/24 0503 07/20/24 0913  NA 133* 134* 134* 132* 133* 132* 135  K 3.8 3.9 3.9 4.9 7.5* 5.7* 4.9  CL 92* 93* 94* 93* 92* 91* 91*  CO2 26 27 28 27 27 25 25   GLUCOSE 97 107* 110* 124* 109* 86 92  BUN 48* 37* 37* 67* 109* 64* 51*  CREATININE 3.00* 2.40* 2.41* 3.40* 4.88* 3.27* 2.97*  CALCIUM  9.9 9.9 9.8 10.4* 11.5* 10.4* 10.2  MG 2.1 2.1  --  2.2  --   --   --   PHOS 3.3 2.8  --  3.4 4.2  --   --    GFR: Estimated Creatinine Clearance: 21 mL/min (A) (by C-G formula based on SCr of 2.97 mg/dL (H)).  Liver Function Tests: Recent Labs  Lab 07/15/24 0600 07/18/24 0900  AST 21  --   ALT 20  --   ALKPHOS 82  --   BILITOT 0.8  --   PROT 6.5  --   ALBUMIN  2.9* 3.1*   No results for input(s): LIPASE, AMYLASE in the last 168 hours. No results for input(s): AMMONIA in the last 168 hours.   CBC: Recent Labs  Lab 07/14/24 0445 07/15/24 0600  07/18/24 0900 07/19/24 0503 07/20/24 0913  WBC 13.4* 12.1* 14.7* 9.6 8.3  NEUTROABS  --   --  11.9*  --   --   HGB 9.8* 9.6* 9.2* 9.8* 9.5*  HCT 31.0* 29.9* 29.0* 31.6* 30.7*  MCV 96.3 95.2 97.3 98.1 97.2  PLT 160 142* 137* 146* 147*     HbA1C: No results found for: HGBA1C  Urinalysis: No results for input(s): COLORURINE, LABSPEC, PHURINE, GLUCOSEU, HGBUR, BILIRUBINUR, KETONESUR, PROTEINUR, UROBILINOGEN, NITRITE, LEUKOCYTESUR in the last 72 hours.  Invalid input(s): APPERANCEUR    Imaging: No results found.      Medications:    sodium chloride       ceFAZolin  (ANCEF ) IV     feeding supplement (NEPRO CARB STEADY) 80 mL/hr at 07/20/24 0338   sodium thiosulfate  25 g in sodium chloride  0.9 % 200 mL Infusion for Calciphylaxis Stopped (07/19/24 1736)   vancomycin  Stopped (07/19/24 1736)    amiodarone   200 mg Oral Daily   apixaban   5 mg Oral BID   Chlorhexidine  Gluconate Cloth  6 each Topical Daily   collagenase   1 Application Topical Daily  free water   30 mL Per Tube Q4H   leptospermum manuka honey  1 Application Topical Daily   levothyroxine   75 mcg Oral Q0600   lidocaine   1 patch Transdermal Q24H   midodrine   10 mg Oral TID WC   multivitamin  1 tablet Oral QHS   nutrition supplement (JUVEN)  1 packet Oral BID BM   sodium chloride  flush  10-40 mL Intracatheter Q12H    Assessment/ Plan:     64 year old female with history of coronary disease history of CABG, ESRD, COPD, atrial fibrillation on hemodialysis Monday Wednesday and Friday now admitted with history of syncope/fall.  In hospital cardiac arrest on Sunday, 07/02/2024 Outpatient dialysis: [facility]/MWF/right PermCath/72.5 kg    ESRD: HD Monday Wednesday and Friday.  Dialysis received yesterday, UF 1.2L achieved. Next treatment scheduled for Friday. Will continue to attempt fluid removal however limited due to blood pressure.   ANEMIA of chronic kidney disease:  Hemoglobin stable, 9.5. Will resume low dose retacrit  with dialysis.    MBD (secondary hyperparathyroidism): Maintain the patient on sodium thiosulfate  for calciphylaxis. Will continue to monitor bone minerals. Awaiting updated phos.   Hypotension/VOL/edema: Patient with A-fib with RVR.  Currently on heparin  drip and maintained on oral amiodarone .  Required pressors during this admission. Blood pressure 101/54  Acute respiratory failure-requiring ventilator postcardiac arrest on 07/02/2024.  Status post extubation 07/11/2024. On 2L       LOS: [address] [facility] 2/5/20262:25 PM  "

## 2024-07-20 NOTE — Progress Notes (Signed)
 [facility] CLINIC CARDIOLOGY PROGRESS NOTE   Patient ID: HATLEY HENEGAR MRN: [id number] DOB/AGE: [birth year] 64 y.o.  Admit date: 07/01/2024 Referring Physician Dr. Devaughn Ban  Primary Physician Orlean Alan HERO, Vanessa  Primary Cardiologist Dr. Denyse Bathe Reason for Consultation syncope  HPI: LYLIA [name] is a 64 y.o. female with a past medical history of chronic HFrEF, coronary artery disease, atrial fibrillation on eliquis , COPD, ESRD on HD, bipolar disorder, hypotension on midodrine , hx CVA  who presented to the ED on 07/01/2024 for syncopal event at home. Cardiology was consulted for further evaluation.   Interval History:  - Patient seen and examined this AM.  Resting in bed, awake and alert. Reports positional CP likely 2/2 CPR. Denies SOB or palpitations. - Sinus bradycardia on tele with occasional PVCs. - BP and HR stable.   Review of systems complete and found to be negative unless listed above   Vitals:   07/20/24 0400 07/20/24 0500 07/20/24 0737 07/20/24 0754  BP:   (!) 88/53 (!) 93/57  Pulse:   (!) 49 (!) 50  Resp: 14  16 18   Temp:   97.9 F (36.6 C) 98.8 F (37.1 C)  TempSrc:      SpO2:   100% 100%  Weight:  78 kg    Height:         Intake/Output Summary (Last 24 hours) at 07/20/2024 1022 Last data filed at 07/20/2024 0818 Gross per 24 hour  Intake 851.79 ml  Output 1200 ml  Net -348.21 ml     PHYSICAL EXAM General: Ill appearing female, no acute distress.  HEENT: Normocephalic and atraumatic. Neck: No JVD.  Lungs: Diminished bilaterally.  Heart: HRRR. Normal S1 and S2 without gallops or murmurs. Radial & DP pulses 2+ bilaterally. Abdomen: Non-distended appearing.  Msk: Normal strength and tone for age. Extremities: No clubbing, cyanosis or edema.     LABS: Basic Metabolic Panel: Recent Labs    07/18/24 0900 07/19/24 0503 07/20/24 0913  NA 133* 132* 135  K 7.5* 5.7* 4.9  CL 92* 91* 91*  CO2 27 25 25   GLUCOSE 109* 86 92  BUN 109* 64* 51*   CREATININE 4.88* 3.27* 2.97*  CALCIUM  11.5* 10.4* 10.2  PHOS 4.2  --   --    Liver Function Tests: Recent Labs    07/18/24 0900  ALBUMIN  3.1*    No results for input(s): LIPASE, AMYLASE in the last 72 hours. CBC: Recent Labs    07/18/24 0900 07/19/24 0503 07/20/24 0913  WBC 14.7* 9.6 8.3  NEUTROABS 11.9*  --   --   HGB 9.2* 9.8* 9.5*  HCT 29.0* 31.6* 30.7*  MCV 97.3 98.1 97.2  PLT 137* 146* 147*   Cardiac Enzymes: No results for input(s): CKTOTAL, CKMB, CKMBINDEX, TROPONINIHS in the last 72 hours. BNP: No results for input(s): BNP in the last 72 hours. D-Dimer: No results for input(s): DDIMER in the last 72 hours. Hemoglobin A1C: No results for input(s): HGBA1C in the last 72 hours. Fasting Lipid Panel: No results for input(s): CHOL, HDL, LDLCALC, TRIG, CHOLHDL, LDLDIRECT in the last 72 hours.  Thyroid  Function Tests: No results for input(s): TSH, T4TOTAL, T3FREE, THYROIDAB in the last 72 hours.  Invalid input(s): FREET3 Anemia Panel: No results for input(s): VITAMINB12, FOLATE, FERRITIN, TIBC, IRON, RETICCTPCT in the last 72 hours.  No results found.     ECHO 06/23/2024: 1. Left ventricular ejection fraction, by estimation, is 25 to 30%. The left ventricle has severely decreased function. The  left ventricle demonstrates global hypokinesis. The left ventricular internal cavity size was severely dilated. Left ventricular diastolic function could not be evaluated.   2. Right ventricular systolic function is severely reduced. The right  ventricular size is severely enlarged. Mildly increased right ventricular  wall thickness.   3. Left atrial size was mildly dilated.   4. Right atrial size was severely dilated.   5. The mitral valve is normal in structure. Mild mitral valve regurgitation.   6. Tricuspid valve regurgitation is severe.   7. The aortic valve is grossly normal. Aortic valve regurgitation is not  visualized. Aortic valve sclerosis is present, with no evidence of aortic  valve stenosis.   TELEMETRY (personally reviewed): Sinus rhythm rate 50s, PVCs  EKG (personally reviewed): AF RBBB rate 167 bpm  DATA reviewed by me 07/20/24: last 24h vitals tele labs imaging I/O, PCCM progress note  Principal Problem:   Syncope and collapse Active Problems:   ESRD on hemodialysis (HCC)   Chronic hypotension   S/P CABG x 3   Mild dementia associated with other underlying disease, without behavioral disturbance, psychotic disturbance, mood disturbance, or anxiety (HCC)   Anemia of chronic disease   ESRD (end stage renal disease) (HCC)   Paroxysmal atrial fibrillation (HCC)   Hypothyroidism   Chronic combined systolic and diastolic CHF (congestive heart failure) (HCC)   Hepatitis C antibody positive in blood   Other cirrhosis of liver (HCC)   Hypertension   Bipolar disorder in full remission, most recent episode unspecified type   Chronic pain syndrome   Other secondary kyphosis, cervical region   Cervical spondylosis   Fall   Acute pulmonary edema (HCC)   Bacteremia due to Streptococcus   MRSA bacteremia   ESRD (end stage renal disease) on dialysis (HCC)   Acute on chronic respiratory failure with hypoxia (HCC)   Acute on chronic HFrEF (heart failure with reduced ejection fraction) (HCC)   Cardiac arrest with ventricular fibrillation (HCC)   Toxic metabolic encephalopathy   Pressure injury of skin   Discitis of cervical region    ASSESSMENT AND PLAN: [name] is a 64 y.o. female with a past medical history of chronic HFrEF, coronary artery disease, atrial fibrillation on eliquis , COPD, ESRD on HD, bipolar disorder, hypotension on midodrine , hx CVA  who presented to the ED on 07/01/2024 for syncopal event at home. Cardiology was consulted for further evaluation.   # Atrial fibrillation RVR # Paroxysmal atrial fibrillation # Syncope/ ?cardiac arrest # Chronic HFrEF #  Coronary artery disease Patient brought to ED after being found down at home by family. Initially was admitted to the floor but on 07/02/24 she became unresponsive and was tachycardia, concern for wide complex tachycardia but has RBBB at baseline. Underwent ACLS, received 1 shock and 2 doses of epi with ROSC. Started on IV amiodarone .  Lidocaine  started 07/04/2024 by EP.  Overnight on 07/05/2024 developed bradycardia and thus IV amiodarone  was discontinued. 07/05/24 EP discontinued lidocaine  and restarted amiodarone .  -EP signed off, appreciate their assistance. Given bacteremia and encephalopathy would not be candidate for device.  -Continue eliquis  5 mg twice daily. -Continue PO amiodarone  200 mg daily. -Further management per primary team, appreciate their assistance. No plan for invasive cardiac procedures at this time. Discussed with patient's daughter 07/11/24 that overall prognosis is poor, daughter expressed that family agrees with planning for medical management with no procedures.  -Agree with palliative care evaluation for continued goals of care conversations.   This patient's case  was discussed and created with Dr. Florencio and he is in agreement.  Signed:  Danita Bloch, PA-C  07/20/2024, 10:22 AM [facility] Cardiology

## 2024-07-20 NOTE — Progress Notes (Signed)
 " [facility]  PROGRESS NOTE   Subjective:   Patient seen resting in bed Easily aroused Continues to have weakness  Tube feeds infusing.   Objective:  Vital signs: Blood pressure (!) 101/54, pulse (!) 47, temperature 99.6 F (37.6 C), resp. rate 19, height 5' 6 (1.676 m), weight 78 kg, SpO2 100%.  Intake/Output Summary (Last 24 hours) at 07/20/2024 1425 Last data filed at 07/20/2024 1423 Gross per 24 hour  Intake 971.79 ml  Output 1200 ml  Net -228.21 ml   Filed Weights   07/19/24 1329 07/19/24 1630 07/20/24 0500  Weight: 79.4 kg 78 kg 78 kg     Physical Exam: General:  No acute distress  Head:  Normocephalic, atraumatic.    Eyes:  Anicteric  Lungs:   Clear to auscultation, normal effort  Heart:  S1S2 no rubs  Abdomen:   Soft, distended  Extremities:  + peripheral edema. BUE edema L>R  Neurologic:  Awake   Skin:  Left foot wound  Access:  Right femoral dialysis catheter    Basic Metabolic Panel: Recent Labs  Lab 07/14/24 0445 07/15/24 0526 07/15/24 0600 07/16/24 0930 07/18/24 0900 07/19/24 0503 07/20/24 0913  NA 133* 134* 134* 132* 133* 132* 135  K 3.8 3.9 3.9 4.9 7.5* 5.7* 4.9  CL 92* 93* 94* 93* 92* 91* 91*  CO2 26 27 28 27 27 25 25   GLUCOSE 97 107* 110* 124* 109* 86 92  BUN 48* 37* 37* 67* 109* 64* 51*  CREATININE 3.00* 2.40* 2.41* 3.40* 4.88* 3.27* 2.97*  CALCIUM  9.9 9.9 9.8 10.4* 11.5* 10.4* 10.2  MG 2.1 2.1  --  2.2  --   --   --   PHOS 3.3 2.8  --  3.4 4.2  --   --    GFR: Estimated Creatinine Clearance: 21 mL/min (A) (by C-G formula based on SCr of 2.97 mg/dL (H)).  Liver Function Tests: Recent Labs  Lab 07/15/24 0600 07/18/24 0900  AST 21  --   ALT 20  --   ALKPHOS 82  --   BILITOT 0.8  --   PROT 6.5  --   ALBUMIN  2.9* 3.1*   No results for input(s): LIPASE, AMYLASE in the last 168 hours. No results for input(s): AMMONIA in the last 168 hours.   CBC: Recent Labs  Lab 07/14/24 0445 07/15/24 0600  07/18/24 0900 07/19/24 0503 07/20/24 0913  WBC 13.4* 12.1* 14.7* 9.6 8.3  NEUTROABS  --   --  11.9*  --   --   HGB 9.8* 9.6* 9.2* 9.8* 9.5*  HCT 31.0* 29.9* 29.0* 31.6* 30.7*  MCV 96.3 95.2 97.3 98.1 97.2  PLT 160 142* 137* 146* 147*     HbA1C: No results found for: HGBA1C  Urinalysis: No results for input(s): COLORURINE, LABSPEC, PHURINE, GLUCOSEU, HGBUR, BILIRUBINUR, KETONESUR, PROTEINUR, UROBILINOGEN, NITRITE, LEUKOCYTESUR in the last 72 hours.  Invalid input(s): APPERANCEUR    Imaging: No results found.      Medications:    sodium chloride       ceFAZolin  (ANCEF ) IV     feeding supplement (NEPRO CARB STEADY) 80 mL/hr at 07/20/24 0338   sodium thiosulfate  25 g in sodium chloride  0.9 % 200 mL Infusion for Calciphylaxis Stopped (07/19/24 1736)   vancomycin  Stopped (07/19/24 1736)    amiodarone   200 mg Oral Daily   apixaban   5 mg Oral BID   Chlorhexidine  Gluconate Cloth  6 each Topical Daily   collagenase   1 Application Topical Daily  free water   30 mL Per Tube Q4H   leptospermum manuka honey  1 Application Topical Daily   levothyroxine   75 mcg Oral Q0600   lidocaine   1 patch Transdermal Q24H   midodrine   10 mg Oral TID WC   multivitamin  1 tablet Oral QHS   nutrition supplement (JUVEN)  1 packet Oral BID BM   sodium chloride  flush  10-40 mL Intracatheter Q12H    Assessment/ Plan:     64 year old female with history of coronary disease history of CABG, ESRD, COPD, atrial fibrillation on hemodialysis Monday Wednesday and Friday now admitted with history of syncope/fall.  In hospital cardiac arrest on Sunday, 07/02/2024 Outpatient dialysis: [facility]/MWF/right PermCath/72.5 kg    ESRD: HD Monday Wednesday and Friday.  Dialysis received yesterday, UF 1.2L achieved. Next treatment scheduled for Friday. Will continue to attempt fluid removal however limited due to blood pressure.   ANEMIA of chronic kidney disease:  Hemoglobin stable, 9.5. Will resume low dose retacrit  with dialysis.    MBD (secondary hyperparathyroidism): Maintain the patient on sodium thiosulfate  for calciphylaxis. Will continue to monitor bone minerals. Awaiting updated phos.   Hypotension/VOL/edema: Patient with A-fib with RVR.  Currently on heparin  drip and maintained on oral amiodarone .  Required pressors during this admission. Blood pressure 101/54  Acute respiratory failure-requiring ventilator postcardiac arrest on 07/02/2024.  Status post extubation 07/11/2024. On 2L       LOS: [address] [facility] 2/5/20262:25 PM  "

## 2024-07-20 NOTE — Plan of Care (Signed)
 VSS. RA. Family at bedside this shift. NPO at midnight for [facility] cath placement. Consent placed in chart. PRN pain medications. See MAR. Wound consult placed for arm wounds. IV consult placed. Unable to find IV access.  Problem: Education: Goal: Knowledge of General Education information will improve Description: Including pain rating scale, medication(s)/side effects and non-pharmacologic comfort measures Outcome: Progressing   Problem: Health Behavior/Discharge Planning: Goal: Ability to manage health-related needs will improve Outcome: Progressing   Problem: Clinical Measurements: Goal: Ability to maintain clinical measurements within normal limits will improve Outcome: Progressing Goal: Will remain free from infection Outcome: Progressing Goal: Diagnostic test results will improve Outcome: Progressing Goal: Respiratory complications will improve Outcome: Progressing Goal: Cardiovascular complication will be avoided Outcome: Progressing   Problem: Activity: Goal: Risk for activity intolerance will decrease Outcome: Progressing   Problem: Nutrition: Goal: Adequate nutrition will be maintained Outcome: Progressing   Problem: Coping: Goal: Level of anxiety will decrease Outcome: Progressing   Problem: Elimination: Goal: Will not experience complications related to bowel motility Outcome: Progressing Goal: Will not experience complications related to urinary retention Outcome: Progressing   Problem: Pain Managment: Goal: General experience of comfort will improve and/or be controlled Outcome: Progressing   Problem: Safety: Goal: Ability to remain free from injury will improve Outcome: Progressing   Problem: Skin Integrity: Goal: Risk for impaired skin integrity will decrease Outcome: Progressing

## 2024-07-20 NOTE — Progress Notes (Signed)
 Nutrition Follow-up  DOCUMENTATION CODES:   Not applicable  INTERVENTION:   -Continue regular diet with thin liquids; RD will follow for diet advancement and adjust supplement regimen as appropriate -Continue Magic cup TID with meals, each supplement provides 290 kcal and 9 grams of protein  -Continue feeding assistance with meals -Continue nocturnal feeds via NGT:    Nepro @ 80 ml/hr x 12 hours (2000-0800)    60 ml Proosurce TF20 daily   30 ml free water  flush every 4 hours   Tube feeding regimen provides 1808 kcal (100% of needs), 198 grams of protein, and 698 ml of H2O. Total free water : 878 ml daily   -Continue 1 packet Juven BID, each packet provides 95 calories, 2.5 grams of protein (collagen), and 9.8 grams of carbohydrate (3 grams sugar); also contains 7 grams of L-arginine and L-glutamine, 300 mg vitamin C , 15 mg vitamin E, 1.2 mcg vitamin B-12, 9.5 mg zinc, 200 mg calcium , and 1.5 g  Calcium  Beta-hydroxy-Beta-methylbutyrate to support wound healing  -Case discussed with RN, SLP, TOC, and MD: will monitor progress on regular diet but may need to consider permanent feeding access (ex PEG) if this aligns with patient';s goals of care   NUTRITION DIAGNOSIS:   Inadequate oral intake related to inability to eat (pt sedated and ventilated) as evidenced by NPO status.  Ongoing  GOAL:   Patient will meet greater than or equal to 90% of their needs  Progressing   MONITOR:   Diet advancement, Labs, Weight trends, TF tolerance, Skin, I & O's  REASON FOR ASSESSMENT:   Ventilator    ASSESSMENT:   64 y/o female with h/o ESRD on HD, CAD s/p CABG, ischemic cardiomyopathy, dementia, CVA, PVD, PAF, COPD, HLD, hypothyroid disease, CHF, HCV, cirrhosis, HTN,  DDD, chronic pain, GERD, bipolar disorder, gout, diverticulosis and pulmonary hypertension who is admitted with cardiac arrest, bacteremia and septic shock.  1/26- extubated, NGT placed 1/28- TF initiated at trickle  rate 1/30- s/p BSE- dysphagia 2 diet with thin liquids, calorie count initiated, transitioned to nocturnal feeds 2/2- calorie count completed (consuming 7% of needs), continue nocturnal feeds per MD 2/5- s/p BSE- advanced to regular diet with thin liquids  Reviewed I/O's: -348 ml x 24 hours and +9.4 L since 07/06/24  Per CWOCN notes, patient with medical device related pressure injury to left nare.    Case discussed with SLP; patient advanced to regular diet with thin liquids today. SLP hopeful this well help improve PO intake.   Noted patient consumed 0% at lunch today.   Patient remains on nocturnal feeds for supplemental nutrition due to prolonged poor oral intake. RD will monitor progress on a regular diet.   Reviewed weight history; weight has ranged from 78-81 kg over the past 7 days.   Palliative care following for goals of care discussions. Patient is DNR/DNI and to continue supportive interventions. Family wants to allow time for outcomes.   Medications reviewed.  Labs reviewed: CBGS: 79-91 (inpatient orders for glycemic control are none).    Diet Order:   Diet Order             Diet NPO time specified  Diet effective midnight           DIET DYS 2 Room service appropriate? Yes with Assist; Fluid consistency: Thin  Diet effective now                   EDUCATION NEEDS:   No education needs have been  identified at this time  Skin:  Skin Assessment: Skin Integrity Issues: Skin Integrity Issues:: Other (Comment) Other: device related pressure injury to left nare  Last BM:  07/20/23 (type 5)  Height:   Ht Readings from Last 1 Encounters:  07/01/24 5' 6 (1.676 m)    Weight:   Wt Readings from Last 1 Encounters:  07/20/24 78 kg    Ideal Body Weight:  59 kg  BMI:  Body mass index is 27.75 kg/m.  Estimated Nutritional Needs:   Kcal:  1800-2100kcal/day  Protein:  90-105g/day  Fluid:  UOP +1L    Margery ORN, RD, LDN, CDCES Registered Dietitian  III Certified Diabetes Care and Education Specialist If unable to reach this RD, please use RD Inpatient group chat on secure chat between hours of 8am-4 pm daily

## 2024-07-20 NOTE — Plan of Care (Signed)
"                                                                                                                                            °                                                   °  Palliative Care Progress Note   Patient Name: [name]       Date: 07/20/2024 DOB: [birth year]  Age: 64 y.o. MRN#: [id number] Attending Physician: Awanda City, MD Primary Care Physician: Orlean Alan HERO, FNP Admit Date: 07/01/2024  Extensive chart review completed including labs, vital signs, imaging, progress notes, orders, and available advanced directive documents from current and previous encounters.   I visited with patient at bedside.  She is asleep with NG tube in place but not in use yet.  She does not awaken to my presence.  No family or friends are present at bedside during my visit.  As per chart review, patient was awake and alert when cardiology met with patient this morning.  SLP was unable to meet with patient yesterday given she was in HD.  They plan to evaluate patient today.  No acute palliative needs identified at this time.  No in person visit needed.  However, PMT will continue to follow and support, and will reengage where appropriate.  Thank you for allowing the Palliative Medicine Team to assist in the care of [name]  Lamarr L. Arvid, DNP, FNP-BC Palliative Medicine Team   No charge  "

## 2024-07-20 NOTE — Progress Notes (Signed)
 Speech Language Pathology Treatment: Dysphagia  Patient Details Name: MONTEEN TOOPS MRN: [id number] DOB: [birth year] Today's Date: 07/20/2024 Time: 8849-8784 SLP Time Calculation (min) (ACUTE ONLY): 25 min  Assessment / Plan / Recommendation Clinical Impression  Pt seen for follow up dysphagia intervention. Pt on 2L nasal canula, asleep upon therapist approach. Per chart review, pt remains afebrile, WBC WNL, and no recent chest imaging. Min verbal/tactile cues increased alertness. Oral care completed. Pt expressing significant neck pain, hindering attention to task- RN notified. Trials completed of thin liquids via straw and regular solids. No overt or subtle s/sx pharyngeal dysphagia noted. No change to vocal quality across trials. Oral manipulation and clearance min prolonged suspect related to internal distraction (pain).  Recommend advancement to regular solids and thin liquids with supervision for sustained alertness and assist for feeding. Aspiration precautions remain (slow rate, small bites, elevated HOB, and alert for PO intake). MD, dietician, and RN notified of recommendations. No further acute SLP services indicated at this time.    HPI HPI: Per MD progress note, NASHYA GARLINGTON is a 64 y.o. female with a pmh of of ESRD on HD (MWF), COPD, CHF, bipolar disorder, chronic hypotension on midodrine , HLD and prior CVA who presented to [facility] ED via EMS following syncopal episode. Pt intubated 1/18-1/26. MRI 1/23: No acute abnormality.  2. Multiple remote infarcts as above. no recent imaging      SLP Plan  All goals met        Swallow Evaluation Recommendations   Recommendations: PO diet PO Diet Recommendation: Regular;Thin liquids (Level 0) Liquid Administration via: Cup;Straw Medication Administration:  (whole in liquid vs puree) Supervision: Staff to assist with self-feeding;Full supervision/cueing for swallowing strategies Postural changes: Stay upright 30-60 min after  meals;Position pt fully upright for meals Oral care recommendations: Oral care BID (2x/day);Staff/trained caregiver to provide oral care     Recommendations                     Oral care BID;Staff/trained caregiver to provide oral care   Frequent or constant Supervision/Assistance Dysphagia, unspecified (R13.10)     All goals met    Bianney Rockwood Clapp, MS, CCC-SLP Speech Language Pathologist Rehab Services; [facility] - [facility] [phone] (ascom)   [place] Grosser J Clapp  07/20/2024, 1:20 PM

## 2024-07-20 NOTE — Consult Note (Signed)
" °  CLINICAL SUPPORT TEAM - WOUND OSTOMY AND CONTINENCE TEAM  CONSULTATION SERVICES   WOC Nurse-Inpatient Note   WOC Nurse Consult Note: Reason for Consult: Secure chat with attending and charge nurse about PI previous classified as stage 2 to sacrum. Performed remote evaluation of clinical images and notes in order to determine recommendations.  Wound type: PI Stage 3 to sacrum/buttock. Pressure Injury POA: No Measurement: total 9 cm x 7 cm x 0.1 cm (two open wounds) Wound bed: 80% pale red, 20% dark yellow eschar/slough. Drainage (amount, consistency, odor) Moderate amount, no odor, serous. Periwound: intact, viable edges. Dressing procedure/placement/frequency: Cleanse the wound with Vashe [id number], not rinse. Apply a thick layer of Medihoney to the wound bed, top with silicone foam dressing, change daily or PRN.    Wound type: Partial thickness bilateral thigh, below buttocks, due to friction and MASD associated. Pressure Injury POA: NA Measurement: 5 cm x 5 cm x 0.1 cm. Wound bed: 100% red. Drainage (amount, consistency, odor) Serosanguinous. Periwound: intact Dressing procedure/placement/frequency: Cleanse the wound with Vashe [id number], not rinse. Apply a single layer of Xeroform daily, foam dressing on top changing every 3 days or PRN. Ok to lift the foam, change the Xeroform and reapply.    WOC team will follow weekly the PI stage 3 to buttock and NG tube mucosal pressure injury L nostril as previously assessed.  Please reconsult if further assistance is needed. Thank-you,  Lela Holm MSN, RN, CWCN, CNS.  (Phone [phone])     "

## 2024-07-21 ENCOUNTER — Encounter: Admitting: Certified Registered"

## 2024-07-21 ENCOUNTER — Encounter: Payer: Self-pay | Admitting: Hospitalist

## 2024-07-21 ENCOUNTER — Encounter
Admission: EM | Payer: Self-pay | Source: Home / Self Care | Attending: Student in an Organized Health Care Education/Training Program

## 2024-07-21 LAB — CBC
HCT: 27.2 % — ABNORMAL LOW (ref 36.0–46.0)
Hemoglobin: 8.7 g/dL — ABNORMAL LOW (ref 12.0–15.0)
MCH: 30.9 pg (ref 26.0–34.0)
MCHC: 32 g/dL (ref 30.0–36.0)
MCV: 96.5 fL (ref 80.0–100.0)
Platelets: 153 10*3/uL (ref 150–400)
RBC: 2.82 MIL/uL — ABNORMAL LOW (ref 3.87–5.11)
RDW: 20.7 % — ABNORMAL HIGH (ref 11.5–15.5)
WBC: 8.3 10*3/uL (ref 4.0–10.5)
nRBC: 0.6 % — ABNORMAL HIGH (ref 0.0–0.2)

## 2024-07-21 LAB — PHOSPHORUS: Phosphorus: 4.7 mg/dL — ABNORMAL HIGH (ref 2.5–4.6)

## 2024-07-21 LAB — BASIC METABOLIC PANEL WITH GFR
Anion gap: 20 — ABNORMAL HIGH (ref 5–15)
BUN: 67 mg/dL — ABNORMAL HIGH (ref 8–23)
CO2: 25 mmol/L (ref 22–32)
Calcium: 10.5 mg/dL — ABNORMAL HIGH (ref 8.9–10.3)
Chloride: 90 mmol/L — ABNORMAL LOW (ref 98–111)
Creatinine, Ser: 3.94 mg/dL — ABNORMAL HIGH (ref 0.44–1.00)
GFR, Estimated: 12 mL/min — ABNORMAL LOW
Glucose, Bld: 82 mg/dL (ref 70–99)
Potassium: 5.3 mmol/L — ABNORMAL HIGH (ref 3.5–5.1)
Sodium: 135 mmol/L (ref 135–145)

## 2024-07-21 LAB — GLUCOSE, CAPILLARY
Glucose-Capillary: 88 mg/dL (ref 70–99)
Glucose-Capillary: 92 mg/dL (ref 70–99)
Glucose-Capillary: 96 mg/dL (ref 70–99)
Glucose-Capillary: 108 mg/dL — ABNORMAL HIGH (ref 70–99)
Glucose-Capillary: 99 mg/dL (ref 70–99)
Glucose-Capillary: 40 mg/dL — CL (ref 70–99)
Glucose-Capillary: 81 mg/dL (ref 70–99)

## 2024-07-21 MED ORDER — LEVOTHYROXINE SODIUM 50 MCG PO TABS
75.0000 ug | ORAL_TABLET | Freq: Every day | ORAL | Status: DC
  Administered 2024-07-21: 75 ug
  Filled 2024-07-21: qty 1

## 2024-07-21 MED ORDER — SODIUM THIOSULFATE 250 MG/ML IV SOLN: 25.0000 g | Freq: Once | INTRAVENOUS | Status: DC

## 2024-07-21 MED ORDER — NEPRO/CARBSTEADY PO LIQD
1000.0000 mL | ORAL | Status: DC
  Administered 2024-07-21: 1000 mL

## 2024-07-21 MED ORDER — FENTANYL CITRATE (PF) 100 MCG/2ML IJ SOLN
INTRAMUSCULAR | Status: AC
  Filled 2024-07-21: qty 2

## 2024-07-21 MED ORDER — LORAZEPAM 0.5 MG PO TABS
0.5000 mg | ORAL_TABLET | Freq: Once | ORAL | Status: AC
  Administered 2024-07-21: 0.5 mg
  Filled 2024-07-21: qty 1

## 2024-07-21 MED ORDER — FENTANYL CITRATE (PF) 100 MCG/2ML IJ SOLN
INTRAMUSCULAR | Status: DC | PRN
  Administered 2024-07-21: 25 ug via INTRAVENOUS

## 2024-07-21 MED ORDER — EPHEDRINE SULFATE-NACL 50-0.9 MG/10ML-% IV SOSY
PREFILLED_SYRINGE | INTRAVENOUS | Status: DC | PRN
  Administered 2024-07-21: 10 mg via INTRAVENOUS

## 2024-07-21 MED ORDER — ACETAMINOPHEN 500 MG PO TABS: 1000.0000 mg | ORAL_TABLET | Freq: Three times a day (TID) | ORAL | Status: DC | PRN

## 2024-07-21 MED ORDER — POLYETHYLENE GLYCOL 3350 17 G PO PACK: 17.0000 g | PACK | Freq: Every day | ORAL | Status: AC | PRN

## 2024-07-21 MED ORDER — JUVEN PO PACK: 1.0000 | PACK | Freq: Two times a day (BID) | ORAL | Status: DC

## 2024-07-21 MED ORDER — RENA-VITE PO TABS
1.0000 | ORAL_TABLET | Freq: Every day | ORAL | Status: DC
  Administered 2024-07-21: 1
  Filled 2024-07-21: qty 1

## 2024-07-21 MED ORDER — HEPARIN SODIUM (PORCINE) 10000 UNIT/ML IJ SOLN
INTRAMUSCULAR | Status: DC | PRN
  Administered 2024-07-21: 10000 [IU]

## 2024-07-21 MED ORDER — HEPARIN SODIUM (PORCINE) 10000 UNIT/ML IJ SOLN
INTRAMUSCULAR | Status: AC
  Filled 2024-07-21: qty 1

## 2024-07-21 MED ORDER — APIXABAN 5 MG PO TABS: 5.0000 mg | ORAL_TABLET | Freq: Two times a day (BID) | ORAL | Status: AC

## 2024-07-21 MED ORDER — RENA-VITE PO TABS: 1.0000 | ORAL_TABLET | Freq: Every day | ORAL | Status: AC

## 2024-07-21 MED ORDER — IODIXANOL 320 MG/ML IV SOLN
INTRAVENOUS | Status: DC | PRN
  Administered 2024-07-21: 5 mL

## 2024-07-21 MED ORDER — PROPOFOL 500 MG/50ML IV EMUL
INTRAVENOUS | Status: DC | PRN
  Administered 2024-07-21: 100 ug/kg/min via INTRAVENOUS

## 2024-07-21 MED ORDER — SODIUM THIOSULFATE 250 MG/ML IV SOLN: 25.0000 g | Freq: Once | INTRAVENOUS | Status: AC

## 2024-07-21 MED ORDER — POLYETHYLENE GLYCOL 3350 17 G PO PACK: 17.0000 g | PACK | Freq: Every day | ORAL | Status: DC | PRN

## 2024-07-21 MED ORDER — PROPOFOL 1000 MG/100ML IV EMUL
INTRAVENOUS | Status: AC
  Filled 2024-07-21: qty 100

## 2024-07-21 MED ORDER — AMIODARONE HCL 200 MG PO TABS: 200.0000 mg | ORAL_TABLET | Freq: Every day | ORAL | Status: AC

## 2024-07-21 MED ORDER — HEPARIN (PORCINE) IN NACL 1000-0.9 UT/500ML-% IV SOLN
INTRAVENOUS | Status: DC | PRN
  Administered 2024-07-21: 500 mL

## 2024-07-21 MED ORDER — LEVOTHYROXINE SODIUM 50 MCG PO TABS: 75.0000 ug | ORAL_TABLET | Freq: Every day | ORAL | Status: AC

## 2024-07-21 MED ORDER — AMIODARONE HCL 200 MG PO TABS
200.0000 mg | ORAL_TABLET | Freq: Every day | ORAL | Status: DC
  Administered 2024-07-21: 200 mg
  Filled 2024-07-21: qty 1

## 2024-07-21 MED ORDER — MIDODRINE HCL 5 MG PO TABS: 10.0000 mg | ORAL_TABLET | Freq: Three times a day (TID) | ORAL | Status: AC

## 2024-07-21 MED ORDER — ACETAMINOPHEN 500 MG PO TABS: 1000.0000 mg | ORAL_TABLET | Freq: Three times a day (TID) | ORAL | Status: AC | PRN

## 2024-07-21 MED ORDER — GLYCOPYRROLATE 0.2 MG/ML IJ SOLN
INTRAMUSCULAR | Status: DC | PRN
  Administered 2024-07-21: .2 mg via INTRAVENOUS

## 2024-07-21 MED ORDER — MIDODRINE HCL 5 MG PO TABS
10.0000 mg | ORAL_TABLET | Freq: Three times a day (TID) | ORAL | Status: DC
  Administered 2024-07-21 (×2): 10 mg
  Filled 2024-07-21 (×2): qty 2

## 2024-07-21 MED ORDER — NEPRO/CARBSTEADY PO LIQD
1000.0000 mL | ORAL | Status: AC
  Administered 2024-07-22: 1000 mL via ORAL

## 2024-07-21 MED ORDER — APIXABAN 5 MG PO TABS
5.0000 mg | ORAL_TABLET | Freq: Two times a day (BID) | ORAL | Status: DC
  Administered 2024-07-21 (×2): 5 mg
  Filled 2024-07-21 (×2): qty 1

## 2024-07-21 MED ORDER — LIDOCAINE-EPINEPHRINE (PF) 1 %-1:200000 IJ SOLN
INTRAMUSCULAR | Status: DC | PRN
  Administered 2024-07-21: 20 mL

## 2024-07-21 MED ORDER — JUVEN PO PACK: 1.0000 | PACK | Freq: Two times a day (BID) | ORAL | Status: AC

## 2024-07-21 NOTE — Interval H&P Note (Signed)
 History and Physical Interval Note:  07/21/2024 3:39 PM  [name]  has presented today for surgery, with the diagnosis of renal failure.  The various methods of treatment have been discussed with the patient and family. After consideration of risks, benefits and other options for treatment, the patient has consented to  Procedures: DIALYSIS/PERMA CATHETER INSERTION (N/A) as a surgical intervention.  The patient's history has been reviewed, patient examined, no change in status, stable for surgery.  I have reviewed the patient's chart and labs.  Questions were answered to the patient's satisfaction.     Askia Hazelip

## 2024-07-21 NOTE — Transfer of Care (Signed)
 Immediate Anesthesia Transfer of Care Note  Patient: [name]  Procedure(s) Performed: DIALYSIS/PERMA CATHETER INSERTION  Patient Location: Short Stay  Anesthesia Type:General  Level of Consciousness: awake and patient cooperative  Airway & Oxygen  Therapy: Patient Spontanous Breathing and Patient connected to nasal cannula oxygen   Post-op Assessment: Report given to RN and Post -op Vital signs reviewed and stable  Post vital signs: stable  Last Vitals:  Vitals Value Taken Time  BP 128/53 07/21/24 17:01  Temp 35.9 C 07/21/24 17:01  Pulse    Resp 21 07/21/24 17:03  SpO2    Vitals shown include unfiled device data.  Last Pain:  Vitals:   07/21/24 1701  TempSrc: Temporal  PainSc: Asleep      Patients Stated Pain Goal: 0 (07/21/24 0405)  Complications: No notable events documented.

## 2024-07-21 NOTE — Progress Notes (Signed)
 " PROGRESS NOTE    [name]  FMW:[id number] DOB: Jan 18, 1961 DOA: 07/01/2024 PCP: Orlean Alan HERO, FNP  [id number]  LOS: 19 days   Brief hospital course:   Assessment & Plan: [name] is a 64 y.o. female with a pmh of of ESRD on HD (MWF), COPD, CHF, bipolar disorder, chronic hypotension on midodrine , HLD and prior CVA who presented to [facility] ED via EMS following syncopal episode.   07/02/24-transferred to ICU post CODE BLUE on medical floor status post ACLS with ROSC  07/03/24- overnight, unresposive with AF. MWF(HD). Will meet with family. Will continue to monitor and re-evaluate post dialysis.  07/04/24- patient had few episodes of VT. She is DNR code status.  She was seen by cardiology, she has poor prognosis. No procedures are planned.  07/05/24- patient had hypotensive episode during HD so it was interuppted.  The lidocaine  infusion will be dcd today and amiodarone  will continue.  Plan for paracentesis today. 07/06/24- patient with no events overnight, did have a non sustained run of VT similar to previous.  Performing awakening trial today.  Due to staph/strep bacteremia the tunnelled vascCath has to come out.  07/07/24 - started PO amiodarone , MRI brain unremarkable 07/08/24 - more awake and interactive 07/09/24 - intubated and off sedation, failed WUA 07/10/24 - more awake, following commands, extubated 07/11/24 - HD catheter placed, triple lumen removed from left groin 07/12/24 - off vasopressin  07/13/24 transferred to [facility]   #Cardiac Arrest  #Ventricular Fibrillation --cardio consulted --cont amio  #CAD  #Ischemic Cardiomyopathy  Hypotension --cont midodrine  --small bolus 250 ml for systolic <80's  #Septic Shock 2/2 Strep gordonii bacteremia- MRSA bacteremia --HD cath removed  Repeat blood cultrue X 2 NG 2 d echo no vegetations valves --cont IV Vanc --duration 6 weeks  #Afib  --cont amiodarone  --cont Eliquis   Acute metabolic encephalopathy  Likely  hospital delirium --extubated with improvement in mental status, but not quite back to her baseline. MRI brain without acute pathology.   Chronic combined CHF --LVEF 25% and a dysfunctional and enlarged right ventricle.  --management per cardio   Acute on chronic respiratory failure  On 2-3L O2 at baseline --in the setting of cardiac arrest, now extubated as of 07/10/24 and currently on home 2-3L O2.  Ascites  s/p paracentesis- possible cirrhosis could be cardiac cirrhosis Cell coulnt 605 ( 44% N) Protein not sent Culture neg   ESRD on HD  --had a tunneled dialysis catheter. With concern for bacteremia this was explanted. Placed temp HD catheter in the right groin. --iHD per nephro --PermCath placement today.  Both jugular veins were occluded so had to place a femoral permcath.  Hyperkalemia --changed tube feed from Osmolite to Nephro --management with HD  Hyponatremia --now stable in low 130's  Possible cervical myelopathy  --Pt is not a good candidate for surgical fixation.   --soft collar to help with pain  Poor oral intake --cont nocturnal tube feeds for now --monitor oral intake  Stage 3 to sacrum/buttock, not POA  NG tube mucosal pressure injury L nostril    DVT prophylaxis: On:Eliquis  Code Status: DNR  Family Communication:  Level of care: Progressive Dispo:   The patient is from: home Anticipated d/c is to: [facility]   Subjective and Interval History:  Pt was very anxious this morning, crying and telling her brother that she was an emotional wreck.    Underwent PermCath placement today.  Both jugular veins were occluded so had to place a femoral permcath.  Objective: Vitals:   07/21/24 1701 07/21/24 1715 07/21/24 1732 07/21/24 1809  BP: (!) 128/53 (!) 118/58 (!) 121/46 134/65  Pulse:  (!) 59  65  Resp: 16 19 16 16   Temp: (!) 96.6 F (35.9 C)   98.7 F (37.1 C)  TempSrc: Temporal     SpO2: 98% 98% 95% (!) 85%  Weight:      Height:         Intake/Output Summary (Last 24 hours) at 07/21/2024 2032 Last data filed at 07/21/2024 1733 Gross per 24 hour  Intake 1284.33 ml  Output --  Net 1284.33 ml   Filed Weights   07/19/24 1630 07/20/24 0500 07/21/24 0500  Weight: 78 kg 78 kg 77.3 kg    Examination:   Constitutional: NAD, alert, oriented HEENT: conjunctivae and lids normal, EOMI CV: No cyanosis.   RESP: normal respiratory effort Extremities: edema in BUE improved SKIN: warm, dry   Data Reviewed: I have personally reviewed labs and imaging studies  Time spent: 35 minutes  Vanessa Haber, MD [facility] If 7PM-7AM, please contact night-coverage 07/21/2024, 8:32 PM   "

## 2024-07-21 NOTE — Anesthesia Preprocedure Evaluation (Signed)
 "                                  Anesthesia Evaluation  Patient identified by MRN, date of birth, ID band Patient awake    Reviewed: Allergy & Precautions, NPO status , Patient's Chart, lab work & pertinent test results  History of Anesthesia Complications Negative for: history of anesthetic complications  Airway Mallampati: III  TM Distance: <3 FB Neck ROM: full    Dental  (+) Chipped, Poor Dentition, Missing, Dental Advidsory Given   Pulmonary shortness of breath and with exertion, neg sleep apnea, COPD, neg recent URI, Current Smoker and Patient abstained from smoking.   Pulmonary exam normal        Cardiovascular hypertension, (-) angina + CAD, + Past MI, + CABG, + Peripheral Vascular Disease and +CHF  (-) Cardiac Stents Normal cardiovascular exam+ dysrhythmias (-) Valvular Problems/Murmurs  ECHO 05/17/23: 1. Left ventricular ejection fraction, by estimation, is 25 to 30%. The left ventricle has severely decreased function. The left ventricle demonstrates global hypokinesis. The left ventricular internal cavity size was severely dilated. There is moderate  left ventricular hypertrophy. Left ventricular diastolic parameters are consistent with Grade II diastolic dysfunction (pseudonormalization).   2. Right ventricular systolic function is severely reduced. The right ventricular size is severely enlarged. Mildly increased right ventricular wall thickness. There is severely elevated pulmonary artery systolic pressure.   3. Left atrial size was severely dilated.   4. Right atrial size was mildly dilated.   5. The mitral valve is grossly normal. Mild to moderate mitral valve regurgitation.   6. Tricuspid valve regurgitation is moderate.   7. The aortic valve is calcified. Aortic valve regurgitation is not visualized.     Neuro/Psych  Headaches PSYCHIATRIC DISORDERS      CVA, Residual Symptoms    GI/Hepatic ,GERD  Controlled,,(+) Hepatitis -  Endo/Other   Hypothyroidism    Renal/GU Renal disease  negative genitourinary   Musculoskeletal   Abdominal   Peds  Hematology negative hematology ROS (+)   Anesthesia Other Findings Past Medical History: 03/03/2014: Acute lacunar infarction [facility])     Comment:  a.) MRI brain 03/03/2014: two acute punctate lacunar               infarcts anteriorly in the BILATERAL centrum semiovale No date: Adult behavior problems No date: Anemia of chronic renal failure No date: Aortic atherosclerosis (HCC) No date: Atrial fibrillation and flutter (HCC)     Comment:  a.) CHA2DS2VASc = 6 (sex, HFrEF, HTN, CVA x 2, prior               MI/vascular disease) as of 02/04/2024; b.) rate/rhythm               maintained on oral metoprolol  succinate; chronically               anticoagulated with apixaban  No date: Bipolar disorder (HCC) No date: Cardiomegaly No date: Cerebral microvascular disease No date: Chicken pox No date: Cholelithiasis 12/19/2021: Chronic respiratory failure with hypoxia (HCC) No date: COPD (chronic obstructive pulmonary disease) (HCC) No date: Coronary artery disease No date: DDD (degenerative disc disease), cervical No date: DDD (degenerative disc disease), thoracolumbar No date: Diverticulosis No date: Dyspnea No date: ESRD (end stage renal disease) on dialysis (M,W,F) No date: GERD (gastroesophageal reflux disease) No date: HCV (hepatitis C virus) No date: HFrEF (heart failure with reduced ejection fraction) (HCC)  04/12/2014: History of delirium No date: History of substance abuse (HCC)     Comment:  a.) tobacco + cocaine 11/13/2014: Hyperkalemia No date: Hyperlipidemia No date: Hypertension 02/24/2014: Hypotension No date: Hypothyroidism No date: Ischemic cardiomyopathy 02/01/2024: Nose colonized with MRSA     Comment:  a.) presurgical PCR (+) on 02/01/2024 prior to A/V               FISTULAGRAM 10/08/2013: NSTEMI (non-ST elevated myocardial infarction) (HCC) 01/23/2002:  NSTEMI (non-ST elevated myocardial infarction) [facility])     Comment:  a.) LHC 01/24/2002 --> multi-vessel CAD with IRA being               90% OM2 --> delayed PCI until 01/25/2002 --> 3.0 x 23 mm               BX Velocity stent No date: Obesity No date: 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 No date: On apixaban  therapy No date: Peripheral vascular disease (HCC) 02/27/2014: Postoperative anemia due to acute blood loss 02/21/2014: Postoperative cerebrovascular infarction following  cardiac surgery (HCC)     Comment:  a.) developed RIGHT upper/lower extremity weakness               following cardiac revascularization --> neuro consult and              head CT --> Hypodensity noted in the area of the LEFT               motor cortex consistent with a component of acute               ischemia No date: Pulmonary hypertension (HCC) No date: RBBB (right bundle branch block) No date: Renal osteodystrophy 02/20/2014: S/P CABG x 3     Comment:  a.) LIMA-LAD, SVG-OM1, SVG-PDA 05/23/2010: Stroke (HCC)     Comment:  a.) brain MRI 05/23/2010: BILATERAL acute infarcts and               old prior infarcts; RIGHT frontal lobe extending into the              white matter. Small focus of restricted diffusion in the               cortex of the medial LEFT frontoparietal lobe.               Periventricular T2 and multiple foci of T2 hyperintensity              in the subcortical white matter and increased FLAIR               signal in the cortex of the LEFT frontal lobe. 07/25/2020: Syncope and collapse  Past Surgical History: 03/30/2019: A/V FISTULAGRAM; Left     Comment:  Procedure: A/V FISTULAGRAM;  Surgeon: Marea Selinda RAMAN, MD;               Location: [facility];  Service: Cardiovascular;              Laterality: Left; 10/06/2019: A/V FISTULAGRAM; Left     Comment:  Procedure: A/V FISTULAGRAM;  Surgeon: Marea Selinda RAMAN, MD;               Location: [facility];  Service:  Cardiovascular;              Laterality: Left; 05/19/2021: A/V FISTULAGRAM; Left     Comment:  Procedure: A/V FISTULAGRAM;  Surgeon: Marea Selinda RAMAN, MD;  Location: [facility];  Service: Cardiovascular;              Laterality: Left; 01/29/2022: A/V FISTULAGRAM; Left     Comment:  Procedure: A/V Fistulagram;  Surgeon: Marea Selinda RAMAN, MD;               Location: [facility];  Service: Cardiovascular;              Laterality: Left; 04/30/2022: A/V FISTULAGRAM; Left     Comment:  Procedure: A/V Fistulagram;  Surgeon: Marea Selinda RAMAN, MD;               Location: [facility];  Service: Cardiovascular;              Laterality: Left; 08/20/2022: A/V FISTULAGRAM; Left     Comment:  Procedure: A/V Fistulagram;  Surgeon: Marea Selinda RAMAN, MD;               Location: [facility];  Service: Cardiovascular;              Laterality: Left; 12/03/2022: A/V FISTULAGRAM; N/A     Comment:  Procedure: A/V Fistulagram;  Surgeon: Marea Selinda RAMAN, MD;               Location: [facility];  Service: Cardiovascular;              Laterality: N/A; 02/11/2023: A/V FISTULAGRAM; Left     Comment:  Procedure: A/V Fistulagram;  Surgeon: Marea Selinda RAMAN, MD;               Location: [facility];  Service: Cardiovascular;              Laterality: Left; No date: COLONOSCOPY 04/22/2021: COLONOSCOPY WITH PROPOFOL ; N/A     Comment:  Procedure: COLONOSCOPY WITH PROPOFOL ;  Surgeon:               Maryruth Ole DASEN, MD;  Location: [facility];                Service: Endoscopy;  Laterality: N/A; 01/25/2002: CORONARY ANGIOPLASTY WITH STENT PLACEMENT; Left     Comment:  Procedure: CORONARY ANGIOPLASTY WITH STENT PLACEMENT;               Location: [facility]; Surgeon: Margie Lovelace, MD 02/20/2014: CORONARY ARTERY BYPASS GRAFT; N/A     Comment:  Procedure: CORONARY ARTERY BYPASS GRAFT; Location: [facility];              Surgeon: Reyes Fruits, MD No date: DIALYSIS FISTULA  CREATION 04/08/2023: DIALYSIS/PERMA CATHETER INSERTION; N/A     Comment:  Procedure: DIALYSIS/PERMA CATHETER INSERTION;  Surgeon:               Marea Selinda RAMAN, MD;  Location: [facility];                Service: Cardiovascular;  Laterality: N/A; 07/06/2023: DIALYSIS/PERMA CATHETER REMOVAL; N/A     Comment:  Procedure: DIALYSIS/PERMA CATHETER REMOVAL;  Surgeon:               [name] Cordella MATSU, MD;  Location: [facility];               Service: Cardiovascular;  Laterality: N/A; No date: ESOPHAGOGASTRODUODENOSCOPY 04/22/2021: ESOPHAGOGASTRODUODENOSCOPY; N/A     Comment:  Procedure: ESOPHAGOGASTRODUODENOSCOPY (EGD);  Surgeon:               Maryruth Ole  T, MD;  Location: [facility];                Service: Endoscopy;  Laterality: N/A; 02/23/2019: FLEXIBLE SIGMOIDOSCOPY; N/A     Comment:  Procedure: FLEXIBLE SIGMOIDOSCOPY;  Surgeon: Gaylyn Gladis PENNER, MD;  Location: [facility];  Service:               Endoscopy;  Laterality: N/A; 02/20/2014: IABP INSERTION     Comment:  Procedure: INTRA-AORTIC BALLOON PUMP INSERTION;               Location: [facility]; Surgeon: Reyes Fruits, MD 01/24/2002: LEFT HEART CATH AND CORONARY ANGIOGRAPHY; Left     Comment:  Procedure: LEFT HEART CATH AND CORONARY ANGIOGRAPHY;               Location: [facility]; Surgeon: Margie Lovelace, MD 05/25/2012: LEFT HEART CATH AND CORONARY ANGIOGRAPHY; Left     Comment:  Procedure: LEFT HEART CATH AND CORONARY ANGIOGRAPHY;               Location: [facility]; Surgeon: Margie Lovelace, MD 10/09/2013: LEFT HEART CATH AND CORONARY ANGIOGRAPHY; Left     Comment:  Procedure: LEFT HEART CATH AND CORONARY ANGIOGRAPHY;               Location: [facility]; Surgeon: Vinie Jude, MD 08/08/2018: LEFT HEART CATH AND CORS/GRAFTS ANGIOGRAPHY; N/A     Comment:  Procedure: LEFT HEART CATH AND CORS/GRAFTS ANGIOGRAPHY;               Surgeon: Fernand Denyse LABOR, MD;  Location: [facility]              [facility];  Service:  Cardiovascular;  Laterality: N/A; 01/30/2019: LEFT HEART CATH AND CORS/GRAFTS ANGIOGRAPHY; N/A     Comment:  Procedure: LEFT HEART CATH AND CORS/GRAFTS ANGIOGRAPHY;               Surgeon: Fernand Denyse LABOR, MD;  Location: [facility]              [facility];  Service: Cardiovascular;  Laterality: N/A; No date: ultrasound guided pericardiocentesis     Reproductive/Obstetrics negative OB ROS                              Anesthesia Physical Anesthesia Plan  ASA: 4  Anesthesia Plan: General   Post-op Pain Management:    Induction: Intravenous  PONV Risk Score and Plan: 3 and Treatment may vary due to age or medical condition, Propofol  infusion and TIVA  Airway Management Planned: Natural Airway and Simple Face Mask  Additional Equipment:   Intra-op Plan:   Post-operative Plan:   Informed Consent: I have reviewed the patients History and Physical, chart, labs and discussed the procedure including the risks, benefits and alternatives for the proposed anesthesia with the patient or authorized representative who has indicated his/her understanding and acceptance.     Dental Advisory Given  Plan Discussed with: Anesthesiologist, CRNA and Surgeon  Anesthesia Plan Comments: (Patient consented for risks of anesthesia including but not limited to:  - adverse reactions to medications - risk of airway placement if required - damage to eyes, teeth, lips or other oral mucosa - nerve damage due to positioning  - sore throat or hoarseness - Damage to heart, brain, nerves, lungs, other parts of body or loss of life  Patient voiced  understanding and assent.)         Anesthesia Quick Evaluation  "

## 2024-07-21 NOTE — Progress Notes (Cosign Needed)
 [facility] CLINIC CARDIOLOGY PROGRESS NOTE   Patient ID: TERRINA DOCTER MRN: [id number] DOB/AGE: April 28, 1961 64 y.o.  Admit date: 07/01/2024 Referring Physician Dr. Devaughn Ban  Primary Physician Orlean Alan HERO, FNP  Primary Cardiologist Dr. Denyse Bathe Reason for Consultation syncope  HPI: DONNEISHA BEANE is a 64 y.o. female with a past medical history of chronic HFrEF, coronary artery disease, atrial fibrillation on eliquis , COPD, ESRD on HD, bipolar disorder, hypotension on midodrine , hx CVA  who presented to the ED on 07/01/2024 for syncopal event at home. Cardiology was consulted for further evaluation.   Interval History:  - Patient seen and examined this AM. Sitting upright in bed. Denies CP, SOB or palpitations. - Sinus bradycardia on tele with occasional PVCs. No dizziness or lightheadedness. - BP and HR stable.   Review of systems complete and found to be negative unless listed above   Vitals:   07/21/24 0117 07/21/24 0500 07/21/24 0502 07/21/24 0823  BP: (!) 100/56  (!) 98/49 (!) 97/53  Pulse: (!) 47  (!) 46 (!) 41  Resp: 17  18 16   Temp: 97.6 F (36.4 C)  97.6 F (36.4 C) 98 F (36.7 C)  TempSrc:      SpO2: 95%  98% 100%  Weight:  77.3 kg    Height:         Intake/Output Summary (Last 24 hours) at 07/21/2024 0859 Last data filed at 07/21/2024 0615 Gross per 24 hour  Intake 540 ml  Output --  Net 540 ml     PHYSICAL EXAM General: Ill appearing female, no acute distress.  HEENT: Normocephalic and atraumatic. Neck: No JVD.  Lungs: Diminished bilaterally.  Heart: HRRR. Normal S1 and S2 without gallops or murmurs. Radial & DP pulses 2+ bilaterally. Abdomen: Non-distended appearing.  Msk: Normal strength and tone for age. Extremities: No clubbing, cyanosis or edema.     LABS: Basic Metabolic Panel: Recent Labs    07/18/24 0900 07/19/24 0503 07/20/24 0913 07/21/24 0454  NA 133*   < > 135 135  K 7.5*   < > 4.9 5.3*  CL 92*   < > 91* 90*  CO2 27   <  > 25 25  GLUCOSE 109*   < > 92 82  BUN 109*   < > 51* 67*  CREATININE 4.88*   < > 2.97* 3.94*  CALCIUM  11.5*   < > 10.2 10.5*  PHOS 4.2  --   --   --    < > = values in this interval not displayed.   Liver Function Tests: Recent Labs    07/18/24 0900  ALBUMIN  3.1*    No results for input(s): LIPASE, AMYLASE in the last 72 hours. CBC: Recent Labs    07/18/24 0900 07/19/24 0503 07/20/24 0913 07/21/24 0454  WBC 14.7*   < > 8.3 8.3  NEUTROABS 11.9*  --   --   --   HGB 9.2*   < > 9.5* 8.7*  HCT 29.0*   < > 30.7* 27.2*  MCV 97.3   < > 97.2 96.5  PLT 137*   < > 147* 153   < > = values in this interval not displayed.   Cardiac Enzymes: No results for input(s): CKTOTAL, CKMB, CKMBINDEX, TROPONINIHS in the last 72 hours. BNP: No results for input(s): BNP in the last 72 hours. D-Dimer: No results for input(s): DDIMER in the last 72 hours. Hemoglobin A1C: No results for input(s): HGBA1C in the last 72 hours. Fasting  Lipid Panel: No results for input(s): CHOL, HDL, LDLCALC, TRIG, CHOLHDL, LDLDIRECT in the last 72 hours.  Thyroid  Function Tests: No results for input(s): TSH, T4TOTAL, T3FREE, THYROIDAB in the last 72 hours.  Invalid input(s): FREET3 Anemia Panel: No results for input(s): VITAMINB12, FOLATE, FERRITIN, TIBC, IRON, RETICCTPCT in the last 72 hours.  No results found.     ECHO 06/23/2024: 1. Left ventricular ejection fraction, by estimation, is 25 to 30%. The left ventricle has severely decreased function. The left ventricle demonstrates global hypokinesis. The left ventricular internal cavity size was severely dilated. Left ventricular diastolic function could not be evaluated.   2. Right ventricular systolic function is severely reduced. The right  ventricular size is severely enlarged. Mildly increased right ventricular  wall thickness.   3. Left atrial size was mildly dilated.   4. Right atrial size was  severely dilated.   5. The mitral valve is normal in structure. Mild mitral valve regurgitation.   6. Tricuspid valve regurgitation is severe.   7. The aortic valve is grossly normal. Aortic valve regurgitation is not visualized. Aortic valve sclerosis is present, with no evidence of aortic  valve stenosis.   TELEMETRY (personally reviewed): Sinus rhythm rate 40-50s, PVCs  EKG (personally reviewed): AF RBBB rate 167 bpm  DATA reviewed by me 07/21/24: last 24h vitals tele labs imaging I/O, PCCM progress note  Principal Problem:   Syncope and collapse Active Problems:   ESRD on hemodialysis (HCC)   Chronic hypotension   S/P CABG x 3   Mild dementia associated with other underlying disease, without behavioral disturbance, psychotic disturbance, mood disturbance, or anxiety (HCC)   Anemia of chronic disease   ESRD (end stage renal disease) (HCC)   Paroxysmal atrial fibrillation (HCC)   Hypothyroidism   Chronic combined systolic and diastolic CHF (congestive heart failure) (HCC)   Hepatitis C antibody positive in blood   Other cirrhosis of liver (HCC)   Hypertension   Bipolar disorder in full remission, most recent episode unspecified type   Chronic pain syndrome   Other secondary kyphosis, cervical region   Cervical spondylosis   Fall   Acute pulmonary edema (HCC)   Bacteremia due to Streptococcus   MRSA bacteremia   ESRD (end stage renal disease) on dialysis (HCC)   Acute on chronic respiratory failure with hypoxia (HCC)   Acute on chronic HFrEF (heart failure with reduced ejection fraction) (HCC)   Cardiac arrest with ventricular fibrillation (HCC)   Toxic metabolic encephalopathy   Pressure injury of skin   Discitis of cervical region    ASSESSMENT AND PLAN: ANALAYA HOEY is a 64 y.o. female with a past medical history of chronic HFrEF, coronary artery disease, atrial fibrillation on eliquis , COPD, ESRD on HD, bipolar disorder, hypotension on midodrine , hx CVA  who  presented to the ED on 07/01/2024 for syncopal event at home. Cardiology was consulted for further evaluation.   # Atrial fibrillation RVR # Paroxysmal atrial fibrillation # Syncope/ ?cardiac arrest # Chronic HFrEF # Coronary artery disease Patient brought to ED after being found down at home by family. Initially was admitted to the floor but on 07/02/24 she became unresponsive and was tachycardia, concern for wide complex tachycardia but has RBBB at baseline. Underwent ACLS, received 1 shock and 2 doses of epi with ROSC. Started on IV amiodarone .  Lidocaine  started 07/04/2024 by EP.  Overnight on 07/05/2024 developed bradycardia and thus IV amiodarone  was discontinued. 07/05/24 EP discontinued lidocaine  and restarted amiodarone .  -EP signed  off, appreciate their assistance. Given bacteremia and encephalopathy would not be candidate for device.  -Continue eliquis  5 mg twice daily. -Continue PO amiodarone  200 mg daily. -Further management per primary team, appreciate their assistance. No plan for invasive cardiac procedures at this time. Discussed with patient's daughter 07/11/24 that overall prognosis is poor, daughter expressed that family agrees with planning for medical management with no procedures.  -Agree with palliative care evaluation for continued goals of care conversations.   This patient's case was discussed and created with Dr. Florencio and he is in agreement.  Signed:  Danita Bloch, PA-C  07/21/2024, 8:59 AM [facility]

## 2024-07-21 NOTE — Progress Notes (Signed)
 Pharmacy Antibiotic Note  [name] is a 64 y.o. female admitted on 07/01/2024 with bacteremia.  Patient with PMH including chronic HFrEF, coronary artery disease, atrial fibrillation on eliquis , COPD, ESRD on HD, bipolar disorder, hypotension on midodrine , hx CVA. ID follow up for bacteremia. OPAT completed for Vancomycin  at HD (HD schedule MWF).   Height: 5' 6 (167.6 cm) Weight: 77.3 kg (170 lb 6.7 oz) IBW/kg (Calculated) : 59.3  Temp (24hrs), Avg:98.1 F (36.7 C), Min:97.6 F (36.4 C), Max:99.6 F (37.6 C)  Recent Labs  Lab 07/15/24 0600 07/16/24 0930 07/18/24 0900 07/19/24 0503 07/20/24 0913 07/21/24 0454  WBC 12.1*  --  14.7* 9.6 8.3 8.3  CREATININE 2.41* 3.40* 4.88* 3.27* 2.97* 3.94*    Estimated Creatinine Clearance: 15.7 mL/min (A) (by C-G formula based on SCr of 3.94 mg/dL (H)).    Allergies[1]   Microbiology results: 07/02/24 BCx MRSA anerobic bottle of 1 set Strep gordonii in the 2nd set Staph epidermidis second ser   07/04/24 BCx NG 07/05/24 ascites NG 07/10/24 BCx NG   Plan: OPAT Indication: MRSA Bacteremia, cervical discitis Regimen: Vancomycin  750mg  IV qHD on Mon/Wed/Fri End date: 08/16/2024  Pharmacy to follow dose given appropriately while inpatient Next HD scheduled for Friday 07/21/2024 (last dose give 2/4 at dialysis). Will follow up and order levels and adjust therapy if/as needed.  Jacqualine Weichel Rodriguez-Guzman PharmD, BCPS 07/21/2024 9:23 AM     [1]  Allergies Allergen Reactions   Morphine  And Codeine Hives   Levaquin [Levofloxacin] Itching and Other (See Comments)    Severe itching; prickly sensation; severe left leg pain; immobility.   Enalapril  Other (See Comments)    hallucinations   Latex Rash   Tape Rash and Other (See Comments)

## 2024-07-21 NOTE — Progress Notes (Addendum)
 " [facility]  PROGRESS NOTE   Subjective:   Patient seen sitting up in bed Alert and oriented Smiling, making jokes today NPO for permcath placement  Objective:  Vital signs: Blood pressure 117/70, pulse (!) 49, temperature 98 F (36.7 C), resp. rate 16, height 5' 6 (1.676 m), weight 77.3 kg, SpO2 100%.  Intake/Output Summary (Last 24 hours) at 07/21/2024 1420 Last data filed at 07/21/2024 1220 Gross per 24 hour  Intake 540 ml  Output --  Net 540 ml   Filed Weights   07/19/24 1630 07/20/24 0500 07/21/24 0500  Weight: 78 kg 78 kg 77.3 kg     Physical Exam: General:  No acute distress  Head:  Normocephalic, atraumatic.    Eyes:  Anicteric  Lungs:   Clear to auscultation, normal effort  Heart:  S1S2 no rubs  Abdomen:   Soft, distended  Extremities:  + peripheral edema. BUE edema L>R  Neurologic:  Awake   Skin:  Left foot wound  Access:  Right femoral dialysis catheter    Basic Metabolic Panel: Recent Labs  Lab 07/15/24 0526 07/15/24 0600 07/16/24 0930 07/18/24 0900 07/19/24 0503 07/20/24 0913 07/21/24 0454  NA 134*   < > 132* 133* 132* 135 135  K 3.9   < > 4.9 7.5* 5.7* 4.9 5.3*  CL 93*   < > 93* 92* 91* 91* 90*  CO2 27   < > 27 27 25 25 25   GLUCOSE 107*   < > 124* 109* 86 92 82  BUN 37*   < > 67* 109* 64* 51* 67*  CREATININE 2.40*   < > 3.40* 4.88* 3.27* 2.97* 3.94*  CALCIUM  9.9   < > 10.4* 11.5* 10.4* 10.2 10.5*  MG 2.1  --  2.2  --   --   --   --   PHOS 2.8  --  3.4 4.2  --   --   --    < > = values in this interval not displayed.   GFR: Estimated Creatinine Clearance: 15.7 mL/min (A) (by C-G formula based on SCr of 3.94 mg/dL (H)).  Liver Function Tests: Recent Labs  Lab 07/15/24 0600 07/18/24 0900  AST 21  --   ALT 20  --   ALKPHOS 82  --   BILITOT 0.8  --   PROT 6.5  --   ALBUMIN  2.9* 3.1*   No results for input(s): LIPASE, AMYLASE in the last 168 hours. No results for input(s): AMMONIA in the last 168  hours.   CBC: Recent Labs  Lab 07/15/24 0600 07/18/24 0900 07/19/24 0503 07/20/24 0913 07/21/24 0454  WBC 12.1* 14.7* 9.6 8.3 8.3  NEUTROABS  --  11.9*  --   --   --   HGB 9.6* 9.2* 9.8* 9.5* 8.7*  HCT 29.9* 29.0* 31.6* 30.7* 27.2*  MCV 95.2 97.3 98.1 97.2 96.5  PLT 142* 137* 146* 147* 153     HbA1C: No results found for: HGBA1C  Urinalysis: No results for input(s): COLORURINE, LABSPEC, PHURINE, GLUCOSEU, HGBUR, BILIRUBINUR, KETONESUR, PROTEINUR, UROBILINOGEN, NITRITE, LEUKOCYTESUR in the last 72 hours.  Invalid input(s): APPERANCEUR    Imaging: No results found.      Medications:    sodium chloride       ceFAZolin  (ANCEF ) IV     sodium thiosulfate  25 g in sodium chloride  0.9 % 200 mL Infusion for Calciphylaxis Stopped (07/19/24 1736)   vancomycin  Stopped (07/19/24 1736)    amiodarone   200 mg Per Tube Daily  apixaban   5 mg Per Tube BID   Chlorhexidine  Gluconate Cloth  6 each Topical Daily   collagenase   1 Application Topical Daily   diclofenac  Sodium  2 g Topical QID   epoetin  alfa-epbx (RETACRIT ) injection  4,000 Units Intravenous Q M,W,F-1800   free water   30 mL Per Tube Q4H   leptospermum manuka honey  1 Application Topical Daily   levothyroxine   75 mcg Per Tube Q0600   lidocaine   1 patch Transdermal Q24H   midodrine   10 mg Per Tube TID WC   multivitamin  1 tablet Per Tube QHS   nutrition supplement (JUVEN)  1 packet Per Tube BID BM   sodium chloride  flush  10-40 mL Intracatheter Q12H    Assessment/ Plan:     64 year old female with history of coronary disease history of CABG, ESRD, COPD, atrial fibrillation on hemodialysis Monday Wednesday and Friday now admitted with history of syncope/fall.  In hospital cardiac arrest on Sunday, 07/02/2024 Outpatient dialysis: [facility]/MWF/right PermCath/72.5 kg    ESRD: HD Monday Wednesday and Friday.  Due to permcath procedure, will defer dialysis treatment until  tomorrow.   ANEMIA of chronic kidney disease: Hemoglobin 8.7. Will resume low dose retacrit  with dialysis.    MBD (secondary hyperparathyroidism): Maintain the patient on sodium thiosulfate  for calciphylaxis. Will adjust STS dose to tomorrows treatment. Awaiting updated phos.   Hypotension/VOL/edema: Patient with A-fib with RVR.  Currently on heparin  drip and maintained on oral amiodarone .  Required pressors during this admission. Blood pressure 117/70   Acute respiratory failure-requiring ventilator postcardiac arrest on 07/02/2024.  Status post extubation 07/11/2024. Increased to 3L      LOS: [address] [facility] 2/6/20262:20 PM  "

## 2024-07-21 NOTE — TOC Progression Note (Signed)
 Transition of Care [facility]) - Progression Note    Patient Details  Name: [name] MRN: [id number] Date of Birth: 1960/08/27  Transition of Care [facility]) CM/SW Contact  Lauraine JAYSON Carpen, LCSW Phone Number: 07/21/2024, 9:55 AM  Clinical Narrative: CSW continues to follow progress. Patient still has NG.    Expected Discharge Plan: Home w Home Health Services Barriers to Discharge: Continued Medical Work up               Expected Discharge Plan and Services       Living arrangements for the past 2 months: Single Family Home                           HH Arranged: RN, PT, OT [facility] Agency: [facility] Date [facility] Agency Contacted: 07/06/24   Representative spoke with at [facility] Agency: [place]   Social Drivers of Health (SDOH) Interventions SDOH Screenings   Food Insecurity: No Food Insecurity (07/02/2024)  Housing: Low Risk (07/02/2024)  Transportation Needs: No Transportation Needs (07/02/2024)  Utilities: Not At Risk (07/02/2024)  Depression (PHQ2-9): Low Risk (07/06/2023)  Financial Resource Strain: Low Risk  (05/31/2024)   Received from [facility]  Tobacco Use: High Risk (07/01/2024)    Readmission Risk Interventions    07/06/2024   11:55 AM 06/26/2024   12:20 PM 12/23/2021   11:41 AM  Readmission Risk Prevention Plan  Transportation Screening Complete Complete Complete  PCP or Specialist Appt within 3-5 Days  Complete   HRI or Home Care Consult  Complete Complete  Social Work Consult for Recovery Care Planning/Counseling  Complete Complete  Palliative Care Screening  Not Applicable Not Applicable  Medication Review [employer]) Complete Complete Complete  PCP or Specialist appointment within 3-5 days of discharge Complete    HRI or Home Care Consult Complete    SW Recovery Care/Counseling Consult Complete    Palliative Care Screening Not Applicable    Skilled Nursing Facility Not Applicable

## 2024-07-21 NOTE — Plan of Care (Signed)

## 2024-07-21 NOTE — Op Note (Signed)
 OPERATIVE NOTE    PRE-OPERATIVE DIAGNOSIS: 1. ESRD 2.  Multiple failed dialysis access with recent bacteremia requiring removal of jugular PermCath  POST-OPERATIVE DIAGNOSIS: same as above  PROCEDURE: Ultrasound guidance for vascular access to the right jugular vein and left femoral  vein Right jugular venogram Fluoroscopic guidance for placement of catheter Placement of a 44 cm tip to cuff tunneled hemodialysis catheter via the left femoral vein  SURGEON: Selinda Gu, MD  ANESTHESIA:  MAC  ESTIMATED BLOOD LOSS: 10 cc  FINDING(S): 1.  Patent left femoral vein  2.  Right jugular vein was patent in the neck but occluded at the clavicle and this was not usable for PermCath placement.  SPECIMEN(S):  None  INDICATIONS:   Patient is a 64 y.o.female who presents with renal failure and multiple failed dialysis accesses with recent bacteremia requiring removal of her jugular PermCath.   The patient needs long term dialysis access for their ESRD, and a Permcath is necessary.  Risks and benefits are discussed and informed consent is obtained.    DESCRIPTION: After obtaining full informed written consent, the patient was brought back to the vascular suited.  Anesthesia provided sedation.  Initially, we visualized the left jugular vein with ultrasound and this was small and atretic and clearly occluded.   We then turned our attention to the right neck.  The right jugular vein in the neck was large and had flow.  I then accessed the right jugular vein under direct ultrasound guidance without difficulty with a Seldinger needle but a J-wire would not pass beyond the clavicle.  A jugular venogram on the right side was then performed through the needle and the jugular vein was occluded at the level of the clavicle and this would not be usable for PermCath.  We then turned our attention to femoral location as both jugulars were not going to be usable for PermCath.  The patient's left groin was sterilely  prepped and draped and a sterile surgical field was created.  The left femoral vein was visualized with ultrasound and found to be patent. It was then accessed under direct ultrasound guidance and a permanent image was recorded. A wire was placed. After skin nick and dilatation, the peel-away sheath was placed over the wire. I then turned my attention to an area about 4-5 cm inferior and lateral to the access incision and a small counterincision was created.  I tunneled from the counter  incision to the access site. Using fluoroscopic guidance, a 44 centimeterer tip to cuff tunneled hemodialysis catheter was selected, and tunneled from the counter  incision to the access site. It was then placed through the peel-away sheath and the peel-away sheath was removed. Using fluoroscopic guidance the catheter tips were parked in the right atrium. The appropriate distal connectors were placed. It withdrew blood well and flushed easily with heparinized saline and a concentrated heparin  solution was then placed. It was secured to the leg  with 2 Prolene sutures. The access incision was closed single 4-0 Monocryl. A 4-0 Monocryl pursestring suture was placed around the exit site. Sterile dressings were placed. The patient tolerated the procedure well and was taken to the recovery room in stable condition.  COMPLICATIONS: None  CONDITION: Stable    Selinda Gu 07/21/2024 4:50 PM   This note was created with Dragon Medical transcription system. Any errors in dictation are purely unintentional.

## 2024-07-21 NOTE — Plan of Care (Signed)

## 2024-07-21 NOTE — Consult Note (Addendum)
" °  CLINICAL SUPPORT TEAM - WOUND OSTOMY AND CONTINENCE TEAM  CONSULTATION SERVICES   WOC Nurse-Inpatient Note   WOC Nurse wound follow up Wound type: Right forearm blister ruptured. Full thickness. Performed remote consultation evaluating photos and pertinent notes in order to determine recommendations.   Measurement: 2 open wounds approx. 3 cm x 3.5 cm x 0.1 cm the wound on top and 4 cm x 3.5 cm x 0.1 cm. Wound bed: dry wound bed, wound above 100% yellow slough/brown, and 50% red, 50% black dry scab partial on top of the wound bed. Drainage (amount, consistency, odor) Scant amount, serous (see nursing flowsheet) Periwound: intact, peeling skin, dry scab. Dressing procedure/placement/frequency: Cleanse with Vashe WD [id number], not rinse. Apply a thick layer of Intrasite [id number] daily, top with silicone foam dressing, changing every 3 days or PRN soiled.    WOC team will follow weekly. Please reconsult if further assistance is needed. Thank-you,  Lela Holm MSN, RN, CWCN, CNS.  (Phone [phone])    "

## 2024-08-01 ENCOUNTER — Ambulatory Visit: Admitting: Infectious Diseases

## 2024-08-15 ENCOUNTER — Ambulatory Visit (INDEPENDENT_AMBULATORY_CARE_PROVIDER_SITE_OTHER): Admitting: Vascular Surgery
